# Patient Record
Sex: Male | Born: 1960 | Race: White | Hispanic: No | Smoking: Current every day smoker
Health system: Southern US, Community
[De-identification: ages and names within clinical notes are randomized; demographics above are authoritative.]

## PROBLEM LIST (undated history)

## (undated) DIAGNOSIS — K219 Gastro-esophageal reflux disease without esophagitis: Secondary | ICD-10-CM

## (undated) DIAGNOSIS — E11621 Type 2 diabetes mellitus with foot ulcer: Secondary | ICD-10-CM

## (undated) DIAGNOSIS — G9009 Other idiopathic peripheral autonomic neuropathy: Secondary | ICD-10-CM

## (undated) DIAGNOSIS — M5136 Other intervertebral disc degeneration, lumbar region: Secondary | ICD-10-CM

## (undated) DIAGNOSIS — M21371 Foot drop, right foot: Secondary | ICD-10-CM

## (undated) DIAGNOSIS — M6281 Muscle weakness (generalized): Secondary | ICD-10-CM

## (undated) DIAGNOSIS — J449 Chronic obstructive pulmonary disease, unspecified: Secondary | ICD-10-CM

## (undated) DIAGNOSIS — Z973 Presence of spectacles and contact lenses: Secondary | ICD-10-CM

## (undated) DIAGNOSIS — R262 Difficulty in walking, not elsewhere classified: Secondary | ICD-10-CM

## (undated) DIAGNOSIS — I639 Cerebral infarction, unspecified: Secondary | ICD-10-CM

## (undated) DIAGNOSIS — G8929 Other chronic pain: Secondary | ICD-10-CM

## (undated) DIAGNOSIS — N189 Chronic kidney disease, unspecified: Secondary | ICD-10-CM

## (undated) DIAGNOSIS — C801 Malignant (primary) neoplasm, unspecified: Secondary | ICD-10-CM

## (undated) DIAGNOSIS — R339 Retention of urine, unspecified: Secondary | ICD-10-CM

## (undated) DIAGNOSIS — Z972 Presence of dental prosthetic device (complete) (partial): Secondary | ICD-10-CM

## (undated) DIAGNOSIS — M549 Dorsalgia, unspecified: Secondary | ICD-10-CM

## (undated) DIAGNOSIS — R2681 Unsteadiness on feet: Secondary | ICD-10-CM

## (undated) DIAGNOSIS — I96 Gangrene, not elsewhere classified: Secondary | ICD-10-CM

## (undated) DIAGNOSIS — N390 Urinary tract infection, site not specified: Secondary | ICD-10-CM

## (undated) DIAGNOSIS — E785 Hyperlipidemia, unspecified: Secondary | ICD-10-CM

## (undated) DIAGNOSIS — M86171 Other acute osteomyelitis, right ankle and foot: Principal | ICD-10-CM

## (undated) DIAGNOSIS — C4492 Squamous cell carcinoma of skin, unspecified: Secondary | ICD-10-CM

## (undated) DIAGNOSIS — M51369 Other intervertebral disc degeneration, lumbar region without mention of lumbar back pain or lower extremity pain: Secondary | ICD-10-CM

## (undated) DIAGNOSIS — C4339 Malignant melanoma of other parts of face: Secondary | ICD-10-CM

## (undated) DIAGNOSIS — I10 Essential (primary) hypertension: Secondary | ICD-10-CM

## (undated) DIAGNOSIS — C44111 Basal cell carcinoma of skin of unspecified eyelid, including canthus: Secondary | ICD-10-CM

## (undated) DIAGNOSIS — C439 Malignant melanoma of skin, unspecified: Secondary | ICD-10-CM

## (undated) DIAGNOSIS — M21372 Foot drop, left foot: Secondary | ICD-10-CM

## (undated) DIAGNOSIS — Z89412 Acquired absence of left great toe: Secondary | ICD-10-CM

## (undated) DIAGNOSIS — N319 Neuromuscular dysfunction of bladder, unspecified: Secondary | ICD-10-CM

## (undated) DIAGNOSIS — I509 Heart failure, unspecified: Secondary | ICD-10-CM

## (undated) DIAGNOSIS — M869 Osteomyelitis, unspecified: Secondary | ICD-10-CM

## (undated) DIAGNOSIS — G894 Chronic pain syndrome: Secondary | ICD-10-CM

## (undated) DIAGNOSIS — R7881 Bacteremia: Secondary | ICD-10-CM

## (undated) DIAGNOSIS — F32A Depression, unspecified: Secondary | ICD-10-CM

## (undated) DIAGNOSIS — E119 Type 2 diabetes mellitus without complications: Secondary | ICD-10-CM

## (undated) DIAGNOSIS — G062 Extradural and subdural abscess, unspecified: Secondary | ICD-10-CM

## (undated) DIAGNOSIS — B379 Candidiasis, unspecified: Secondary | ICD-10-CM

## (undated) DIAGNOSIS — J9600 Acute respiratory failure, unspecified whether with hypoxia or hypercapnia: Secondary | ICD-10-CM

## (undated) DIAGNOSIS — D649 Anemia, unspecified: Secondary | ICD-10-CM

## (undated) DIAGNOSIS — G4700 Insomnia, unspecified: Secondary | ICD-10-CM

## (undated) DIAGNOSIS — B001 Herpesviral vesicular dermatitis: Secondary | ICD-10-CM

## (undated) DIAGNOSIS — R0981 Nasal congestion: Secondary | ICD-10-CM

## (undated) DIAGNOSIS — F329 Major depressive disorder, single episode, unspecified: Secondary | ICD-10-CM

## (undated) DIAGNOSIS — M8630 Chronic multifocal osteomyelitis, unspecified site: Secondary | ICD-10-CM

## (undated) DIAGNOSIS — L97529 Non-pressure chronic ulcer of other part of left foot with unspecified severity: Secondary | ICD-10-CM

## (undated) DIAGNOSIS — T8740 Infection of amputation stump, unspecified extremity: Secondary | ICD-10-CM

## (undated) DIAGNOSIS — T884XXA Failed or difficult intubation, initial encounter: Secondary | ICD-10-CM

## (undated) DIAGNOSIS — B9562 Methicillin resistant Staphylococcus aureus infection as the cause of diseases classified elsewhere: Secondary | ICD-10-CM

## (undated) DIAGNOSIS — Z89511 Acquired absence of right leg below knee: Secondary | ICD-10-CM

## (undated) HISTORY — DX: Cerebral infarction, unspecified: I63.9

## (undated) HISTORY — DX: Foot drop, right foot: M21.371

## (undated) HISTORY — DX: Non-pressure chronic ulcer of other part of left foot with unspecified severity: L97.529

## (undated) HISTORY — DX: Extradural and subdural abscess, unspecified: G06.2

## (undated) HISTORY — DX: Candidiasis, unspecified: B37.9

## (undated) HISTORY — PX: MULTIPLE TOOTH EXTRACTIONS: SHX2053

## (undated) HISTORY — DX: Other acute osteomyelitis, right ankle and foot: M86.171

## (undated) HISTORY — DX: Infection of amputation stump, unspecified extremity: T87.40

## (undated) HISTORY — DX: Gangrene, not elsewhere classified: I96

## (undated) HISTORY — DX: Type 2 diabetes mellitus with foot ulcer: E11.621

---

## 2006-10-15 ENCOUNTER — Encounter: Admission: RE | Admit: 2006-10-15 | Discharge: 2006-10-15 | Payer: Self-pay | Admitting: Family Medicine

## 2008-08-02 ENCOUNTER — Emergency Department (HOSPITAL_COMMUNITY): Admission: EM | Admit: 2008-08-02 | Discharge: 2008-08-03 | Payer: Self-pay | Admitting: Emergency Medicine

## 2010-10-03 LAB — URINALYSIS, ROUTINE W REFLEX MICROSCOPIC
Ketones, ur: NEGATIVE mg/dL
Nitrite: NEGATIVE
Specific Gravity, Urine: 1.017 (ref 1.005–1.030)
pH: 6 (ref 5.0–8.0)

## 2010-10-03 LAB — COMPREHENSIVE METABOLIC PANEL
ALT: 41 U/L (ref 0–53)
Albumin: 3.9 g/dL (ref 3.5–5.2)
Alkaline Phosphatase: 75 U/L (ref 39–117)
Chloride: 96 mEq/L (ref 96–112)
Potassium: 3.1 mEq/L — ABNORMAL LOW (ref 3.5–5.1)
Sodium: 134 mEq/L — ABNORMAL LOW (ref 135–145)
Total Bilirubin: 1.1 mg/dL (ref 0.3–1.2)
Total Protein: 7.7 g/dL (ref 6.0–8.3)

## 2010-10-03 LAB — CBC
MCHC: 35.3 g/dL (ref 30.0–36.0)
RBC: 4.96 MIL/uL (ref 4.22–5.81)
RDW: 13.7 % (ref 11.5–15.5)

## 2010-10-03 LAB — DIFFERENTIAL
Eosinophils Absolute: 0.1 10*3/uL (ref 0.0–0.7)
Eosinophils Relative: 1 % (ref 0–5)
Lymphs Abs: 4.7 10*3/uL — ABNORMAL HIGH (ref 0.7–4.0)
Monocytes Absolute: 0.8 10*3/uL (ref 0.1–1.0)
Monocytes Relative: 6 % (ref 3–12)

## 2010-10-03 LAB — POCT CARDIAC MARKERS: Myoglobin, poc: 113 ng/mL (ref 12–200)

## 2012-03-25 ENCOUNTER — Emergency Department (HOSPITAL_COMMUNITY): Payer: Self-pay

## 2012-03-25 ENCOUNTER — Inpatient Hospital Stay (HOSPITAL_COMMUNITY)
Admission: EM | Admit: 2012-03-25 | Discharge: 2012-04-17 | DRG: 004 | Disposition: A | Discharge status: Home / Self Care | Payer: 59 | Attending: Internal Medicine | Admitting: Internal Medicine

## 2012-03-25 ENCOUNTER — Encounter (HOSPITAL_COMMUNITY): Payer: Self-pay | Admitting: *Deleted

## 2012-03-25 DIAGNOSIS — J9600 Acute respiratory failure, unspecified whether with hypoxia or hypercapnia: Secondary | ICD-10-CM

## 2012-03-25 DIAGNOSIS — Z794 Long term (current) use of insulin: Secondary | ICD-10-CM

## 2012-03-25 DIAGNOSIS — F17200 Nicotine dependence, unspecified, uncomplicated: Secondary | ICD-10-CM | POA: Diagnosis present

## 2012-03-25 DIAGNOSIS — N179 Acute kidney failure, unspecified: Secondary | ICD-10-CM

## 2012-03-25 DIAGNOSIS — Z23 Encounter for immunization: Secondary | ICD-10-CM

## 2012-03-25 DIAGNOSIS — R6520 Severe sepsis without septic shock: Secondary | ICD-10-CM | POA: Diagnosis present

## 2012-03-25 DIAGNOSIS — I214 Non-ST elevation (NSTEMI) myocardial infarction: Secondary | ICD-10-CM | POA: Diagnosis present

## 2012-03-25 DIAGNOSIS — E1142 Type 2 diabetes mellitus with diabetic polyneuropathy: Secondary | ICD-10-CM | POA: Diagnosis present

## 2012-03-25 DIAGNOSIS — I4891 Unspecified atrial fibrillation: Secondary | ICD-10-CM | POA: Diagnosis present

## 2012-03-25 DIAGNOSIS — J189 Pneumonia, unspecified organism: Secondary | ICD-10-CM

## 2012-03-25 DIAGNOSIS — M549 Dorsalgia, unspecified: Secondary | ICD-10-CM | POA: Diagnosis present

## 2012-03-25 DIAGNOSIS — G9341 Metabolic encephalopathy: Secondary | ICD-10-CM | POA: Diagnosis present

## 2012-03-25 DIAGNOSIS — R6521 Severe sepsis with septic shock: Secondary | ICD-10-CM

## 2012-03-25 DIAGNOSIS — J4489 Other specified chronic obstructive pulmonary disease: Secondary | ICD-10-CM | POA: Diagnosis present

## 2012-03-25 DIAGNOSIS — E119 Type 2 diabetes mellitus without complications: Secondary | ICD-10-CM | POA: Diagnosis present

## 2012-03-25 DIAGNOSIS — A419 Sepsis, unspecified organism: Secondary | ICD-10-CM

## 2012-03-25 DIAGNOSIS — A409 Streptococcal sepsis, unspecified: Principal | ICD-10-CM | POA: Diagnosis present

## 2012-03-25 DIAGNOSIS — G9340 Encephalopathy, unspecified: Secondary | ICD-10-CM

## 2012-03-25 DIAGNOSIS — E871 Hypo-osmolality and hyponatremia: Secondary | ICD-10-CM | POA: Diagnosis not present

## 2012-03-25 DIAGNOSIS — E1149 Type 2 diabetes mellitus with other diabetic neurological complication: Secondary | ICD-10-CM | POA: Diagnosis present

## 2012-03-25 DIAGNOSIS — D696 Thrombocytopenia, unspecified: Secondary | ICD-10-CM | POA: Diagnosis not present

## 2012-03-25 DIAGNOSIS — I10 Essential (primary) hypertension: Secondary | ICD-10-CM | POA: Diagnosis present

## 2012-03-25 DIAGNOSIS — G8929 Other chronic pain: Secondary | ICD-10-CM | POA: Diagnosis present

## 2012-03-25 DIAGNOSIS — R7309 Other abnormal glucose: Secondary | ICD-10-CM

## 2012-03-25 DIAGNOSIS — D649 Anemia, unspecified: Secondary | ICD-10-CM | POA: Diagnosis not present

## 2012-03-25 DIAGNOSIS — Z79899 Other long term (current) drug therapy: Secondary | ICD-10-CM

## 2012-03-25 DIAGNOSIS — R739 Hyperglycemia, unspecified: Secondary | ICD-10-CM | POA: Diagnosis present

## 2012-03-25 DIAGNOSIS — J449 Chronic obstructive pulmonary disease, unspecified: Secondary | ICD-10-CM | POA: Diagnosis present

## 2012-03-25 DIAGNOSIS — H109 Unspecified conjunctivitis: Secondary | ICD-10-CM | POA: Diagnosis not present

## 2012-03-25 DIAGNOSIS — E875 Hyperkalemia: Secondary | ICD-10-CM | POA: Diagnosis not present

## 2012-03-25 DIAGNOSIS — K7200 Acute and subacute hepatic failure without coma: Secondary | ICD-10-CM | POA: Diagnosis not present

## 2012-03-25 DIAGNOSIS — J13 Pneumonia due to Streptococcus pneumoniae: Secondary | ICD-10-CM

## 2012-03-25 HISTORY — DX: Type 2 diabetes mellitus without complications: E11.9

## 2012-03-25 HISTORY — DX: Essential (primary) hypertension: I10

## 2012-03-25 HISTORY — DX: Other intervertebral disc degeneration, lumbar region without mention of lumbar back pain or lower extremity pain: M51.369

## 2012-03-25 HISTORY — DX: Other intervertebral disc degeneration, lumbar region: M51.36

## 2012-03-25 LAB — COMPREHENSIVE METABOLIC PANEL
ALT: 33 U/L (ref 0–53)
Albumin: 2.8 g/dL — ABNORMAL LOW (ref 3.5–5.2)
Alkaline Phosphatase: 68 U/L (ref 39–117)
Glucose, Bld: 132 mg/dL — ABNORMAL HIGH (ref 70–99)
Potassium: 4.2 mEq/L (ref 3.5–5.1)
Sodium: 133 mEq/L — ABNORMAL LOW (ref 135–145)
Total Protein: 6.8 g/dL (ref 6.0–8.3)

## 2012-03-25 LAB — CBC WITH DIFFERENTIAL/PLATELET
Basophils Absolute: 0 10*3/uL (ref 0.0–0.1)
Basophils Relative: 0 % (ref 0–1)
Eosinophils Absolute: 0 10*3/uL (ref 0.0–0.7)
Hemoglobin: 12.9 g/dL — ABNORMAL LOW (ref 13.0–17.0)
Lymphocytes Relative: 8 % — ABNORMAL LOW (ref 12–46)
MCHC: 34.1 g/dL (ref 30.0–36.0)
Neutrophils Relative %: 91 % — ABNORMAL HIGH (ref 43–77)
WBC: 8.3 10*3/uL (ref 4.0–10.5)

## 2012-03-25 LAB — URINALYSIS, ROUTINE W REFLEX MICROSCOPIC
Nitrite: POSITIVE — AB
Specific Gravity, Urine: 1.027 (ref 1.005–1.030)
pH: 5 (ref 5.0–8.0)

## 2012-03-25 LAB — POCT I-STAT 3, ART BLOOD GAS (G3+)
Acid-base deficit: 4 mmol/L — ABNORMAL HIGH (ref 0.0–2.0)
Bicarbonate: 22.7 mEq/L (ref 20.0–24.0)
pCO2 arterial: 47 mmHg — ABNORMAL HIGH (ref 35.0–45.0)
pO2, Arterial: 70 mmHg — ABNORMAL LOW (ref 80.0–100.0)

## 2012-03-25 LAB — PROCALCITONIN: Procalcitonin: 80.11 ng/mL

## 2012-03-25 LAB — CARBOXYHEMOGLOBIN
Carboxyhemoglobin: 2.1 % — ABNORMAL HIGH (ref 0.5–1.5)
O2 Saturation: 68.5 %

## 2012-03-25 LAB — URINE MICROSCOPIC-ADD ON

## 2012-03-25 LAB — ACETAMINOPHEN LEVEL: Acetaminophen (Tylenol), Serum: <15 ug/mL (ref 10–30)

## 2012-03-25 MED ORDER — LEVOFLOXACIN IN D5W 750 MG/150ML IV SOLN: 750.0000 mg | INTRAVENOUS | Status: DC

## 2012-03-25 MED ORDER — HEPARIN SODIUM (PORCINE) 5000 UNIT/ML IJ SOLN
5000.0000 [IU] | Freq: Three times a day (TID) | INTRAMUSCULAR | Status: DC
  Administered 2012-03-25 – 2012-03-30 (×13): 5000 [IU] via SUBCUTANEOUS
  Filled 2012-03-25 (×18): qty 1

## 2012-03-25 MED ORDER — SODIUM CHLORIDE 0.9 % IV BOLUS (SEPSIS)
1000.0000 mL | Freq: Once | INTRAVENOUS | Status: AC
  Administered 2012-03-25: 1000 mL via INTRAVENOUS

## 2012-03-25 MED ORDER — SUCCINYLCHOLINE CHLORIDE 20 MG/ML IJ SOLN
INTRAMUSCULAR | Status: AC
  Filled 2012-03-25: qty 1

## 2012-03-25 MED ORDER — DEXTROSE 5 % IV SOLN
500.0000 mg | Freq: Once | INTRAVENOUS | Status: AC
  Administered 2012-03-25: 500 mg via INTRAVENOUS
  Filled 2012-03-25: qty 500

## 2012-03-25 MED ORDER — INSULIN ASPART 100 UNIT/ML ~~LOC~~ SOLN
2.0000 [IU] | SUBCUTANEOUS | Status: DC
  Administered 2012-03-25: 6 [IU] via SUBCUTANEOUS
  Administered 2012-03-26 (×5): 2 [IU] via SUBCUTANEOUS
  Administered 2012-03-27: 4 [IU] via SUBCUTANEOUS
  Administered 2012-03-27: 6 [IU] via SUBCUTANEOUS
  Administered 2012-03-27 – 2012-03-28 (×5): 4 [IU] via SUBCUTANEOUS
  Administered 2012-03-28 – 2012-03-31 (×9): 2 [IU] via SUBCUTANEOUS
  Administered 2012-03-31: 4 [IU] via SUBCUTANEOUS
  Administered 2012-03-31 – 2012-04-01 (×5): 2 [IU] via SUBCUTANEOUS
  Administered 2012-04-01 (×2): 4 [IU] via SUBCUTANEOUS
  Administered 2012-04-01: 2 [IU] via SUBCUTANEOUS
  Administered 2012-04-02: 4 [IU] via SUBCUTANEOUS
  Administered 2012-04-02 (×3): 2 [IU] via SUBCUTANEOUS
  Administered 2012-04-02 – 2012-04-03 (×2): 4 [IU] via SUBCUTANEOUS
  Administered 2012-04-03: 2 [IU] via SUBCUTANEOUS
  Administered 2012-04-03 (×2): 4 [IU] via SUBCUTANEOUS
  Administered 2012-04-03: 2 [IU] via SUBCUTANEOUS
  Administered 2012-04-03: 4 [IU] via SUBCUTANEOUS
  Administered 2012-04-04: 2 [IU] via SUBCUTANEOUS
  Administered 2012-04-04 (×3): 4 [IU] via SUBCUTANEOUS
  Administered 2012-04-04 – 2012-04-05 (×3): 2 [IU] via SUBCUTANEOUS
  Administered 2012-04-05: 4 [IU] via SUBCUTANEOUS
  Administered 2012-04-05: 6 [IU] via SUBCUTANEOUS
  Administered 2012-04-05: 2 [IU] via SUBCUTANEOUS
  Administered 2012-04-05 (×2): 4 [IU] via SUBCUTANEOUS
  Administered 2012-04-06: 2 [IU] via SUBCUTANEOUS
  Administered 2012-04-06: 4 [IU] via SUBCUTANEOUS
  Administered 2012-04-06 (×2): 2 [IU] via SUBCUTANEOUS
  Administered 2012-04-06: 4 [IU] via SUBCUTANEOUS
  Administered 2012-04-06: 2 [IU] via SUBCUTANEOUS
  Administered 2012-04-07 (×4): 4 [IU] via SUBCUTANEOUS
  Administered 2012-04-07: 6 [IU] via SUBCUTANEOUS
  Administered 2012-04-07 – 2012-04-08 (×3): 4 [IU] via SUBCUTANEOUS
  Administered 2012-04-08: 6 [IU] via SUBCUTANEOUS
  Administered 2012-04-08 – 2012-04-09 (×5): 4 [IU] via SUBCUTANEOUS
  Administered 2012-04-09: 6 [IU] via SUBCUTANEOUS
  Administered 2012-04-09: 4 [IU] via SUBCUTANEOUS

## 2012-03-25 MED ORDER — BIOTENE DRY MOUTH MT LIQD
1.0000 "application " | Freq: Four times a day (QID) | OROMUCOSAL | Status: DC
  Administered 2012-03-26 – 2012-04-14 (×77): 15 mL via OROMUCOSAL

## 2012-03-25 MED ORDER — DEXTROSE 5 % IV SOLN
INTRAVENOUS | Status: AC
  Filled 2012-03-25: qty 250

## 2012-03-25 MED ORDER — FENTANYL CITRATE 0.05 MG/ML IJ SOLN
INTRAMUSCULAR | Status: AC
  Filled 2012-03-25: qty 4

## 2012-03-25 MED ORDER — ROCURONIUM BROMIDE 50 MG/5ML IV SOLN
INTRAVENOUS | Status: AC | PRN
  Administered 2012-03-25: 50 mg via INTRAVENOUS

## 2012-03-25 MED ORDER — ETOMIDATE 2 MG/ML IV SOLN
INTRAVENOUS | Status: AC | PRN
  Administered 2012-03-25 (×2): 20 mg via INTRAVENOUS

## 2012-03-25 MED ORDER — PIPERACILLIN-TAZOBACTAM IN DEX 2-0.25 GM/50ML IV SOLN
2.2500 g | Freq: Once | INTRAVENOUS | Status: AC
  Administered 2012-03-25: 2.25 g via INTRAVENOUS
  Filled 2012-03-25: qty 50

## 2012-03-25 MED ORDER — PANTOPRAZOLE SODIUM 40 MG IV SOLR
40.0000 mg | INTRAVENOUS | Status: DC
  Administered 2012-03-25 – 2012-03-28 (×4): 40 mg via INTRAVENOUS
  Filled 2012-03-25 (×7): qty 40

## 2012-03-25 MED ORDER — NOREPINEPHRINE BITARTRATE 1 MG/ML IJ SOLN
2.0000 ug/min | INTRAVENOUS | Status: DC
  Administered 2012-03-25: 40 ug/min via INTRAVENOUS
  Filled 2012-03-25 (×2): qty 4

## 2012-03-25 MED ORDER — ETOMIDATE 2 MG/ML IV SOLN
INTRAVENOUS | Status: AC
  Filled 2012-03-25: qty 20

## 2012-03-25 MED ORDER — NOREPINEPHRINE BITARTRATE 1 MG/ML IJ SOLN
2.0000 ug/min | INTRAMUSCULAR | Status: DC
  Administered 2012-03-26: 30 ug/min via INTRAVENOUS
  Administered 2012-03-26: 20 ug/min via INTRAVENOUS
  Filled 2012-03-25 (×2): qty 16

## 2012-03-25 MED ORDER — NOREPINEPHRINE BITARTRATE 1 MG/ML IJ SOLN
INTRAMUSCULAR | Status: AC
  Filled 2012-03-25: qty 4

## 2012-03-25 MED ORDER — NOREPINEPHRINE BITARTRATE 1 MG/ML IJ SOLN
INTRAMUSCULAR | Status: AC | PRN
  Administered 2012-03-25: 30 ug via INTRAVENOUS

## 2012-03-25 MED ORDER — DEXTROSE 5 % IV SOLN
1.0000 g | Freq: Once | INTRAVENOUS | Status: AC
  Administered 2012-03-25: 1 g via INTRAVENOUS
  Filled 2012-03-25: qty 10

## 2012-03-25 MED ORDER — IPRATROPIUM-ALBUTEROL 18-103 MCG/ACT IN AERO
6.0000 | INHALATION_SPRAY | RESPIRATORY_TRACT | Status: DC
  Administered 2012-03-26 – 2012-03-31 (×31): 6 via RESPIRATORY_TRACT
  Filled 2012-03-25 (×2): qty 14.7

## 2012-03-25 MED ORDER — PIPERACILLIN-TAZOBACTAM IN DEX 2-0.25 GM/50ML IV SOLN
2.2500 g | Freq: Four times a day (QID) | INTRAVENOUS | Status: DC
  Administered 2012-03-26 (×2): 2.25 g via INTRAVENOUS
  Filled 2012-03-25 (×4): qty 50

## 2012-03-25 MED ORDER — FENTANYL CITRATE 0.05 MG/ML IJ SOLN
INTRAMUSCULAR | Status: AC | PRN
  Administered 2012-03-25: 100 ug via INTRAVENOUS

## 2012-03-25 MED ORDER — SODIUM CHLORIDE 0.9 % IV SOLN
25.0000 ug/h | INTRAVENOUS | Status: DC
  Administered 2012-03-25: 25 ug/h via INTRAVENOUS
  Administered 2012-03-25: 75 ug/h via INTRAVENOUS
  Administered 2012-03-26: 150 ug/h via INTRAVENOUS
  Administered 2012-03-27: 100 ug/h via INTRAVENOUS
  Administered 2012-03-28: 150 ug/h via INTRAVENOUS
  Administered 2012-03-29: 200 ug/h via INTRAVENOUS
  Administered 2012-03-29: 250 ug/h via INTRAVENOUS
  Administered 2012-03-30: 350 ug/h via INTRAVENOUS
  Administered 2012-03-30: 400 ug/h via INTRAVENOUS
  Filled 2012-03-25 (×11): qty 50

## 2012-03-25 MED ORDER — SODIUM CHLORIDE 0.9 % IV SOLN
2000.0000 mg | Freq: Once | INTRAVENOUS | Status: AC
  Administered 2012-03-25: 2000 mg via INTRAVENOUS
  Filled 2012-03-25: qty 2000

## 2012-03-25 MED ORDER — LEVOFLOXACIN IN D5W 750 MG/150ML IV SOLN
750.0000 mg | INTRAVENOUS | Status: DC
  Filled 2012-03-25: qty 150

## 2012-03-25 MED ORDER — LIDOCAINE HCL (CARDIAC) 20 MG/ML IV SOLN
INTRAVENOUS | Status: AC
  Filled 2012-03-25: qty 5

## 2012-03-25 MED ORDER — ROCURONIUM BROMIDE 50 MG/5ML IV SOLN
INTRAVENOUS | Status: AC
  Filled 2012-03-25: qty 2

## 2012-03-25 MED ORDER — CHLORHEXIDINE GLUCONATE 0.12 % MT SOLN
15.0000 mL | Freq: Two times a day (BID) | OROMUCOSAL | Status: DC
  Administered 2012-03-25: 15 mL via OROMUCOSAL
  Filled 2012-03-25 (×2): qty 15

## 2012-03-25 MED ORDER — SODIUM CHLORIDE 0.9 % IV SOLN
2.0000 mg/h | INTRAVENOUS | Status: DC
  Administered 2012-03-25: 5 mg/h via INTRAVENOUS
  Administered 2012-03-25: 7 mg/h via INTRAVENOUS
  Administered 2012-03-26: 8 mg/h via INTRAVENOUS
  Administered 2012-03-26: 7 mg/h via INTRAVENOUS
  Administered 2012-03-26: 4 mg/h via INTRAVENOUS
  Administered 2012-03-27: 2 mg/h via INTRAVENOUS
  Administered 2012-03-28: 4 mg/h via INTRAVENOUS
  Administered 2012-03-28: 6 mg/h via INTRAVENOUS
  Administered 2012-03-28: 4 mg/h via INTRAVENOUS
  Administered 2012-03-29 (×2): 6 mg/h via INTRAVENOUS
  Administered 2012-03-30: 10 mg/h via INTRAVENOUS
  Administered 2012-03-30: 8 mg/h via INTRAVENOUS
  Administered 2012-03-30: 6 mg/h via INTRAVENOUS
  Administered 2012-03-31: 8 mg/h via INTRAVENOUS
  Administered 2012-04-01: 6 mg/h via INTRAVENOUS
  Administered 2012-04-02 (×3): 10 mg/h via INTRAVENOUS
  Administered 2012-04-03: 8 mg/h via INTRAVENOUS
  Administered 2012-04-03 (×3): 10 mg/h via INTRAVENOUS
  Administered 2012-04-04 (×2): 9 mg/h via INTRAVENOUS
  Filled 2012-03-25 (×31): qty 10

## 2012-03-25 MED ORDER — IPRATROPIUM-ALBUTEROL 18-103 MCG/ACT IN AERO
6.0000 | INHALATION_SPRAY | RESPIRATORY_TRACT | Status: DC | PRN
  Filled 2012-03-25 (×2): qty 14.7

## 2012-03-25 MED ORDER — SODIUM CHLORIDE 0.9 % IV BOLUS (SEPSIS): 1000.0000 mL | Freq: Once | INTRAVENOUS | Status: DC

## 2012-03-25 MED ORDER — SODIUM CHLORIDE 0.9 % IV SOLN
INTRAVENOUS | Status: DC
  Administered 2012-03-25: 100 mL/h via INTRAVENOUS
  Administered 2012-03-26: 14:00:00 via INTRAVENOUS

## 2012-03-25 NOTE — ED Notes (Signed)
ARRIETA, JULIO informed of pt condition and B/p. Respiratory called to bedside.

## 2012-03-25 NOTE — ED Provider Notes (Signed)
History     CSN: [id number]  Arrival date & time 03/25/12  1623   First MD Initiated Contact with Patient 03/25/12 1644      Chief Complaint  Patient presents with  . Respiratory Distress    HPI  51 year old male after it was her diabetes and hypertension presents with couple days history of productive cough, shortness of breath, and feeling weak. His wife was recently diagnosed with pneumonia. EMS was called and on seen patient was noted to be in respiratory distress with sats down to 88%. Denies any chest pain, nausea, bowel pain, vomiting, diarrhea, headache.  The patient received albuterol treatment en-route to the hospital . On arrival is tachycardic pulse 19, tachypneic respiratory rates in the 30s, and hypotensive.  Past Medical History  Diagnosis Date  . Diabetes mellitus without complication   . Hypertension     History reviewed. No pertinent past surgical history.  No family history on file.  History  Substance Use Topics  . Smoking status: Current Every Day Smoker  . Smokeless tobacco: Not on file  . Alcohol Use: No      Review of Systems  Unable to perform ROS Constitutional: Positive for fever, chills and activity change.  Respiratory: Positive for cough and wheezing.     Allergies  Review of patient's allergies indicates no known allergies.  Home Medications  No current outpatient prescriptions on file.  BP 90/60  Pulse 109  Resp 32  SpO2 95%  Physical Exam  Constitutional: He is oriented to person, place, and time. He appears well-developed.       Obese  HENT:  Head: Normocephalic and atraumatic.  Mouth/Throat: Oropharynx is clear and moist.  Eyes: Pupils are equal, round, and reactive to light. Right eye exhibits no discharge. Left eye exhibits no discharge. No scleral icterus.  Neck: Normal range of motion. Neck supple. No tracheal deviation present.  Cardiovascular: Normal heart sounds and intact distal pulses.        Tachycardic    Pulmonary/Chest: He is in respiratory distress. He has rales ( Diffuse bibasilar).  Abdominal: Soft. He exhibits no distension. There is no tenderness. There is no rebound and no guarding.  Musculoskeletal: Normal range of motion. He exhibits no tenderness.  Neurological: He is alert and oriented to person, place, and time.  Skin: Skin is warm.    ED Course  Procedures (including critical care time)    Results for orders placed during the hospital encounter of 03/25/12  CBC WITH DIFFERENTIAL      Component Value Range   WBC 8.3  4.0 - 10.5 K/uL   RBC 4.22  4.22 - 5.81 MIL/uL   Hemoglobin 12.9 (*) 13.0 - 17.0 g/dL   HCT 78.2 (*) 95.6 - 21.3 %   MCV 89.6  78.0 - 100.0 fL   MCH 30.6  26.0 - 34.0 pg   MCHC 34.1  30.0 - 36.0 g/dL   RDW 08.6  57.8 - 46.9 %   Platelets 196  150 - 400 K/uL   Neutrophils Relative 91 (*) 43 - 77 %   Lymphocytes Relative 8 (*) 12 - 46 %   Monocytes Relative 1 (*) 3 - 12 %   Eosinophils Relative 0  0 - 5 %   Basophils Relative 0  0 - 1 %   Neutro Abs 7.5  1.7 - 7.7 K/uL   Lymphs Abs 0.7  0.7 - 4.0 K/uL   Monocytes Absolute 0.1  0.1 - 1.0 K/uL  Eosinophils Absolute 0.0  0.0 - 0.7 K/uL   Basophils Absolute 0.0  0.0 - 0.1 K/uL   WBC Morphology INCREASED BANDS (>20% BANDS)    COMPREHENSIVE METABOLIC PANEL      Component Value Range   Sodium 133 (*) 135 - 145 mEq/L   Potassium 4.2  3.5 - 5.1 mEq/L   Chloride 88 (*) 96 - 112 mEq/L   CO2 20  19 - 32 mEq/L   Glucose, Bld 132 (*) 70 - 99 mg/dL   BUN 65 (*) 6 - 23 mg/dL   Creatinine, Ser 0.98 (*) 0.50 - 1.35 mg/dL   Calcium 8.1 (*) 8.4 - 10.5 mg/dL   Total Protein 6.8  6.0 - 8.3 g/dL   Albumin 2.8 (*) 3.5 - 5.2 g/dL   AST 40 (*) 0 - 37 U/L   ALT 33  0 - 53 U/L   Alkaline Phosphatase 68  39 - 117 U/L   Total Bilirubin 0.5  0.3 - 1.2 mg/dL   GFR calc non Af Amer 10 (*) >90 mL/min   GFR calc Af Amer 12 (*) >90 mL/min  LACTIC ACID, PLASMA      Component Value Range   Lactic Acid, Venous 5.7 (*) 0.5 -  2.2 mmol/L  PRO B NATRIURETIC PEPTIDE      Component Value Range   Pro B Natriuretic peptide (BNP) 7610.0 (*) 0 - 125 pg/mL  GLUCOSE, CAPILLARY      Component Value Range   Glucose-Capillary 124 (*) 70 - 99 mg/dL  ACETAMINOPHEN LEVEL      Component Value Range   Acetaminophen (Tylenol), Serum <15.0  10 - 30 ug/mL  URINALYSIS, ROUTINE W REFLEX MICROSCOPIC      Component Value Range   Color, Urine ORANGE (*) YELLOW   APPearance TURBID (*) CLEAR   Specific Gravity, Urine 1.027  1.005 - 1.030   pH 5.0  5.0 - 8.0   Glucose, UA NEGATIVE  NEGATIVE mg/dL   Hgb urine dipstick LARGE (*) NEGATIVE   Bilirubin Urine MODERATE (*) NEGATIVE   Ketones, ur 15 (*) NEGATIVE mg/dL   Protein, ur 30 (*) NEGATIVE mg/dL   Urobilinogen, UA 0.2  0.0 - 1.0 mg/dL   Nitrite POSITIVE (*) NEGATIVE   Leukocytes, UA SMALL (*) NEGATIVE  PROCALCITONIN      Component Value Range   Procalcitonin 80.11    POCT I-STAT 3, BLOOD GAS (G3+)      Component Value Range   pH, Arterial 7.297 (*) 7.350 - 7.450   pCO2 arterial 47.0 (*) 35.0 - 45.0 mmHg   pO2, Arterial 70.0 (*) 80.0 - 100.0 mmHg   Bicarbonate 22.7  20.0 - 24.0 mEq/L   TCO2 24  0 - 100 mmol/L   O2 Saturation 91.0     Acid-base deficit 4.0 (*) 0.0 - 2.0 mmol/L   Patient temperature 37.9 C     Collection site RADIAL, ALLEN'S TEST ACCEPTABLE     Drawn by Operator     Sample type ARTERIAL    URINE MICROSCOPIC-ADD ON      Component Value Range   Squamous Epithelial / LPF RARE  RARE   WBC, UA 0-2  <3 WBC/hpf   RBC / HPF TOO NUMEROUS TO COUNT  <3 RBC/hpf   Bacteria, UA RARE  RARE   Urine-Other AMORPHOUS URATES/PHOSPHATES    LACTIC ACID, PLASMA      Component Value Range   Lactic Acid, Venous 4.6 (*) 0.5 - 2.2 mmol/L  CARBOXYHEMOGLOBIN  Component Value Range   Total hemoglobin 11.3 (*) 13.5 - 18.0 g/dL   O2 Saturation 13.0     Carboxyhemoglobin 2.1 (*) 0.5 - 1.5 %   Methemoglobin 1.5  0.0 - 1.5 %  TROPONIN I      Component Value Range   Troponin I  0.61 (*) <0.30 ng/mL  MRSA PCR SCREENING      Component Value Range   MRSA by PCR NEGATIVE  NEGATIVE  GLUCOSE, CAPILLARY      Component Value Range   Glucose-Capillary 209 (*) 70 - 99 mg/dL      DG Chest Portable 1 View (Final result)   Result time:03/25/12 1940    Final result by Rad Results In Interface (03/25/12 19:40:06)    Narrative:   *RADIOLOGY REPORT*  Clinical Data: Central line placement.  PORTABLE CHEST - 1 VIEW  Comparison: 03/25/2012 at to 5:18 p.m.  Findings: Left IJ line tip projects over the upper SVC. No pneumothorax. ET tube tip 3.9 cm above the carina.  Cardiomegaly is present with bilateral airspace opacities which appear worsened.  Nasogastric tube enters the stomach. Low lung volumes noted.  IMPRESSION: 1. Left IJ line tip: Upper SVC.  2. Worsened bilateral airspace opacities.  3. Cardiomegaly.   Original Report Authenticated By: Dellia Cloud, M.D.             DG Chest Portable 1 View (Final result)   Result time:03/25/12 [phone]    Final result by Rad Results In Interface (03/25/12 17:31:16)    Narrative:   *RADIOLOGY REPORT*  Clinical Data: Respiratory distress.  PORTABLE CHEST - 1 VIEW  Comparison: No priors.  Findings: Single portable view of the chest demonstrates extensive interstitial and patchy multifocal airspace disease throughout the lungs bilaterally, most confluent in the left mid and lower lung, concerning for multilobar pneumonia. There is also likely a small left pleural effusion. Assessment of pulmonary vasculature is limited by the background of multifocal interstitial and airspace disease. A component of underlying pulmonary edema is possible. Cardiac silhouette is partially obscured, but heart size appears borderline enlarged.  IMPRESSION: 1. Extensive patchy multifocal asymmetrically distributed interstitial and airspace disease, as above, concerning for multilobar pneumonia. 2. Superimposed  pulmonary edema is possible, and if present, would be only mild. 3. Possible small left pleural effusion.   Original Report Authenticated By: [name], M.D.       1. Pneumonia   2. Acute encephalopathy   3. Acute renal failure   4. Acute respiratory failure   5. Diabetes   6. Hyperglycemia   7. Sepsis       MDM      51 year old male presents with community acquired pneumonia. Patient is tachycardic on arrival tachypneic hypoxic and hypotensive.  Patient was made with sepsis, blood culture drawn prior to about administration. Chest x-ray shows extensive patchy multifocal interstitial air space disease concerning for multilobar pneumonia. Given continued hypoxia and worsening mental status discussion was had to intubate the patient. Patient was successfully intubated with glidescope. Patient was copiously hydrated with normal saline boluses.  Case was discussed with Dr. Marin Shutter, patient admitted to the intensive care unit. Transfer patient hemodynamically stable, intubated and on mechanical ventilation ventilation.      Nadara Mustard, MD 03/26/12 0005

## 2012-03-25 NOTE — ED Notes (Signed)
JACUBOWITZ, SAM informed of b/p 2 liters of ns running on pressure bag. Pt c/o back pain and thirst. Pt remains alert with periods of confusion. Skin cool and dry at this point

## 2012-03-25 NOTE — ED Notes (Signed)
Aundra Millet Daughter [phone] House phone [phone] [name] Daughter [phone] Tammy girlfriend

## 2012-03-25 NOTE — Procedures (Signed)
Name:  Gregory [name] MRN:  [id number] DOB:  04/09/1961  PROCEDURE NOTE  Procedure:  Ultrasound-guided central venous catheter placement.  Indications:  Need for intravenous access and hemodynamic monitoring.  Consent:  Consent was implied due to the emergency nature of the procedure.  Anesthesia:  A total of 10 mL of 1% Lidocaine was used for local infiltration anesthesia.  Procedure summary:  Appropriate equipment was assembled.  The patient was identified as Gregory [name] and safety timeout was performed. The patient was placed in Trendelenburg position.  Sterile technique was used. The patient's left neck region was prepped using chlorhexidine / alcohol scrub and the field was draped in usual sterile fashion with full body drape. The left internal jugular vein and the left carotid artery were identified by ultrasound, the patency was evaluated and images were documented. After the adequate anesthesia was achieved, the vein was cannulated with the introducer needle under sonographic guidance without difficulty. A guide wire was advanced through the introducer needle, which was then withdrawn. A small skin incision was made at the point of wire entry, the dilator was inserted over the guide wire and appropriate dilation was obtained. The dilator was removed and triple-lumen catheter was advanced over the guide wire, which was then removed.  All ports were aspirated and flushed with normal saline without difficulty. The catheter was secured into place with sutures. Antibiotic patch was placed and sterile dressing was applied. Post-procedure chest x-ray was ordered.  Complications:  No immediate complications were noted.  Hemodynamic parameters and oxygenation remained stable throughout the procedure.  Estimated blood loss:  Less then 5 mL.  Orlean Bradford, M.D. Pulmonary and Critical Care Medicine [facility] Cell: [phone] Pager: [phone]  03/25/2012, 7:57 PM

## 2012-03-25 NOTE — ED Provider Notes (Signed)
Complains of cough productive of yellow sputum and shortness of breath onset 03/20/2012. No fever no other complaint treated for pneumonia as outpatient last week. EMS treated patient with albuterol nebulized treatment. On exam patient is ill-appearing speaks in sentences lungs diffuse rhonchi heart regular in rhythm abdomen obese nontender extremities without edema. Patient remained hypotensive despite treatment with 2 L of normal saline intravenously. He's felt to be septic for multilobar pneumonia. At 6:30 PM he speaks in short sentences lungs diffuse coarse rhonchi audible without a stethoscope. Pulmonary critical care team  called to evaluate patient for admission. CRITICAL CARE Performed by: Doug Sou   Total critical care time: 30 minute  Critical care time was exclusive of separately billable procedures and treating other patients.  Critical care was necessary to treat or prevent imminent or life-threatening deterioration.  Critical care was time spent personally by me on the following activities: development of treatment plan with patient and/or surrogate as well as nursing, discussions with consultants, evaluation of patient's response to treatment, examination of patient, obtaining history from patient or surrogate, ordering and performing treatments and interventions, ordering and review of laboratory studies, ordering and review of radiographic studies, pulse oximetry and re-evaluation of patient's condition.  Doug Sou, MD 03/25/12 1840

## 2012-03-25 NOTE — Progress Notes (Signed)
Pt continues to sat around 95-90% on 100%NRB, with bilateral crackles. Pt appears to be more SOB. RT asked MD about possibly placing pt on BIPAP to help with WOB. MD asked about O2 saturation, and wants a repeat ABG in 30 min. Pt remaining on NRB at this time. RT will continue to monitor.

## 2012-03-25 NOTE — ED Notes (Signed)
Pt family at bedside. Continue to closely monitor b/p. Remains alertx3.  Denies sob does appear to have increase rate. Respiratory aware. Remains on NRB.

## 2012-03-25 NOTE — Progress Notes (Signed)
Pt in respiratory distress, assessed by RT. Pt on NRB at this time with O2 sats around 94%. Crackles heard bilaterally. RT attempting ABG at this time. RT Will continue to monitor.

## 2012-03-25 NOTE — Procedures (Signed)
Arterial Catheter Insertion Procedure Note Gregory [name] [id number] 07/06/1960  Procedure: Insertion of Arterial Catheter  Indications: Blood pressure monitoring and Frequent blood sampling  Procedure Details Consent: Risks of procedure as well as the alternatives and risks of each were explained to the (patient/caregiver).  Consent for procedure obtained. and Unable to obtain consent because of emergent medical necessity. Time Out: Verified patient identification, verified procedure, site/side was marked, verified correct patient position, special equipment/implants available, medications/allergies/relevent history reviewed, required imaging and test results available.  Performed  Maximum sterile technique was used including antiseptics, cap, gloves, gown, hand hygiene, mask and sheet. Skin prep: Chlorhexidine; local anesthetic administered 22 gauge catheter was inserted into right radial artery using the Seldinger technique.  Evaluation Blood flow good; BP tracing good. Complications: No apparent complications.   Clent Ridges 03/25/2012

## 2012-03-25 NOTE — ED Notes (Signed)
EMS was called out for Welfare check and patient was not able to come to door.  Pt with respiratory distress and sats 88% NRB, hypotensive in 60s and tachycardic

## 2012-03-25 NOTE — H&P (Signed)
Name: Gregory [name] MRN: [id number] DOB: Aug 18, 1960    LOS: 0  Referring Provider:  EDP Reason for Referral:  Acute respiratory failure  PULMONARY / CRITICAL CARE MEDICINE  The patient is encephalopathic and unable to provide history, which was obtained for available medical records.  HPI:  51 yo brought to [facility] ED on 10/8 in respiratory distress and intubated for acute respiratory failure.  Past Medical History  Diagnosis Date  . Diabetes mellitus without complication   . Hypertension    History reviewed. No pertinent past surgical history. Prior to Admission medications   Medication Sig Start Date End Date Taking? Authorizing Provider  insulin NPH-insulin regular (NOVOLIN 70/30) (70-30) 100 UNIT/ML injection Inject 35 Units into the skin daily.   Yes Historical Provider, MD  lisinopril (PRINIVIL,ZESTRIL) 5 MG tablet Take 5 mg by mouth daily.   Yes Historical Provider, MD  metFORMIN (GLUCOPHAGE) 1000 MG tablet Take 1,000 mg by mouth 2 (two) times daily with a meal.   Yes Historical Provider, MD  oxycodone (ROXICODONE) 30 MG immediate release tablet Take 30 mg by mouth every 4 (four) hours.   Yes Historical Provider, MD  PARoxetine (PAXIL) 40 MG tablet Take 40 mg by mouth every morning.   Yes Historical Provider, MD  phentermine (ADIPEX-P) 37.5 MG tablet Take 37.5 mg by mouth daily before breakfast.   Yes Historical Provider, MD  pregabalin (LYRICA) 300 MG capsule Take 300 mg by mouth 2 (two) times daily.   Yes Historical Provider, MD  traZODone (DESYREL) 150 MG tablet Take 150 mg by mouth at bedtime.   Yes Historical Provider, MD   Allergies No Known Allergies  Family History No family history on file.  Social History  reports that he has been smoking.  He does not have any smokeless tobacco history on file. He reports that he does not drink alcohol or use illicit drugs.  Review Of Systems:  Unable to provide  Brief patient description:  51 yo brought to [facility] ED on 10/8 in  respiratory distress and intubated for acute respiratory failure.  Events Since Admission: 10/8  Brought to [facility] ED on 10/8 in respiratory distress and intubated for acute respiratory failure.  Current Status:  Vital Signs: Temp:  [99 F (37.2 C)-100.2 F (37.9 C)] 99 F (37.2 C) (10/08 1855) Pulse Rate:  [88-112] 96  (10/08 1855) Resp:  [21-37] 23  (10/08 1855) BP: (61-113)/(31-77) 113/64 mmHg (10/08 1855) SpO2:  [55 %-100 %] 55 % (10/08 1855) FiO2 (%):  [40 %-100 %] 100 % (10/08 1904)  Physical Examination: General:  Mechanically ventilated, synchronous Neuro:  Sedated, nonfocal, cough / gag diminished HEENT:  PERRL, purulent secretions via ETT Neck:  No JVD Cardiovascular:  RRR, tachycardic, no m/r/g Lungs:  Bilateral air entry, scattered rhonchi  Abdomen:  Soft, nontender, nondistended, bowel sounds present Musculoskeletal:  Moves allextremities Skin:  No rash  Active Problems:  Pneumonia  Acute respiratory failure  Sepsis  Diabetes  Hyperglycemia  Acute encephalopathy  Acute renal failure  ASSESSMENT AND PLAN  PULMONARY  Lab 03/25/12 1725  PHART 7.297*  PCO2ART 47.0*  PO2ART 70.0*  HCO3 22.7  O2SAT 91.0   Ventilator Settings: Vent Mode:  [-] PRVC FiO2 (%):  [40 %-100 %] 100 % Set Rate:  [14 bmp-16 bmp] 16 bmp Vt Set:  [600 mL] 600 mL PEEP:  [5 cmH20] 5 cmH20 Plateau Pressure:  [25 cmH20] 25 cmH20  CXR:  10/8 >>> Hardware in correct position, bilateral airspace disease ETT:  10/8 >>>  A:  Acute respiratory failure.  Pneumonia (CAP vs. Aspiration).  Less likely pulmonary edema.  Suspected COPD (without evidence of exacerbation) - smokes 2-3 packs of cigarettes per day. P:   Goal SpO2 > 92, pH . 7.30 Full mechanical support CXR in AM ABG now Daily SBT Combivent Defer systemic steroids Abx as below  CARDIOVASCULAR  Lab 03/25/12 1648 03/25/12 1647  TROPONINI -- --  LATICACIDVEN -- 5.7*  PROBNP 7610.0* --   ECG:  10/8 >>> No ST-T changes,  sinus tachycardia. Lines: L IJ TLC 10/8 >>>  A: Sepsis.  Less likely acute systolic congestive heart failure.  No arrhythmia / ischemia. P:  Goal MAP > 60 Trend lactic acid Trend COOX Trend troponin Levophed gtt A-line TTE Hold Lisinopril  RENAL  Lab 03/25/12 1647  NA 133*  K 4.2  CL 88*  CO2 20  BUN 65*  CREATININE 5.70*  CALCIUM 8.1*  MG --  PHOS --   Intake/Output    None    Foley:  10/8 >>>  A:  Dehydration.  AKI. P:   Goal CVP 10 to12 NS boluses PRN to goal CVP INF NS 100 mL/h Trend BMP  GASTROINTESTINAL  Lab 03/25/12 1647  AST 40*  ALT 33  ALKPHOS 68  BILITOT 0.5  PROT 6.8  ALBUMIN 2.8*   A:  Suspected malnutrition. P:   NPO as intubated TF if remains intubated > 24 h  HEMATOLOGIC  Lab 03/25/12 1647  HGB 12.9*  HCT 37.8*  PLT 196  INR --  APTT --   A:  No active issues. P:  Trend CBC  INFECTIOUS  Lab 03/25/12 1647  WBC 8.3  PROCALCITON 80.11   Cultures: 10/8  Blood >>> 10/8  Respiratory >>>  Antibiotics: Azithromycin 10/8 x 1 Ceftriaxone 10/8 x 1 Zosyn 10/8 >>> Vancomycin 10/8 >>> Levaquin 10/8 >>>  A:  Pneumonia (CAP vs aspiration). P:   Antibiotics / cultures as above Trend PCT  ENDOCRINE  Lab 03/25/12 1635  GLUCAP 124*   A:  DM. P:   Phase 1 ICU glycemic control protocol Hold Metformin  NEUROLOGIC  A:  Acute encephalopathy.  History of diabetic neuropathy. P:   Goal RASS 0 to -1 Daily WUA Fentanyl / Versed gtt Hold Oxycodone, Paxil, Phentermine, Lyrica, Trazodone  BEST PRACTICE / DISPOSITION Level of Care:  ICU Primary Service:  PCCM Consultants:  None Code Status:  Full Diet:  NPO DVT Px:  Protonix GI Px:  Heparin Skin Integrity:  Intact Social / Family:  Family is not available  The patient is critically ill with multiple organ systems failure and requires high complexity decision making for assessment and support, frequent evaluation and titration of therapies, application of advanced  monitoring technologies and extensive interpretation of multiple databases. Critical Care Time devoted to patient care services described in this note is 60 minutes.  Lonia Farber, M.D. Pulmonary and Critical Care Medicine [facility] Pager: [phone]  03/25/2012, 7:37 PM

## 2012-03-25 NOTE — Procedures (Signed)
Name: [name] MRN: [id number] DOB: 10/08/1960  PROCEDURE NOTE  Procedure:  Endotracheal intubation.  Indication:  Acute respiratory failure  Consent:  Consent was implied due to the emergency nature of the procedure.  Anesthesia:  A total of 10 mg of Etomidate was given intravenously.  Procedure summary:  Appropriate equipment was assembled. The patient was identified as Gregory [name] and safety timeout was performed. The patient was placed supine, with head in sniffing position. After adequate level of anesthesia was achieved, a GS#3 blade was inserted into the oropharynx and the vocal cords were visualized. A 7.5 endotracheal tube was inserted without difficulty and visualized going through the vocal cords. The stylette was removed and cuff inflated. Colorimetric change was noted on the CO2 meter. Breath sounds were heard over both lung fields equally. Post procedure chest xray was ordered.  Complications:  No immediate complications were noted.  Hemodynamic parameters and oxygenation remained stable throughout the procedure.    Orlean Bradford, M.D. Pulmonary and Critical Care Medicine [facility] Cell: [phone] Pager: [phone]  03/25/2012, 7:56 PM

## 2012-03-25 NOTE — Consult Note (Addendum)
ANTIBIOTIC CONSULT NOTE - INITIAL  Pharmacy Consult for Vancomycin and Zosyn Indication: pneumonia  No Known Allergies  Patient Measurements: Height: 5\' 11"  (180.3 cm) Weight: 248 lb (112.492 kg) IBW/kg (Calculated) : 75.3   Vital Signs: Temp: 99.5 F (37.5 C) (10/08 1945) BP: 112/60 mmHg (10/08 1945) Pulse Rate: 116  (10/08 1945) Intake/Output from previous day:   Intake/Output from this shift:    Labs:  [facility] 03/25/12 1647  WBC 8.3  HGB 12.9*  PLT 196  LABCREA --  CREATININE 5.70*   Estimated Creatinine Clearance: 19.3 ml/min (by C-G formula based on Cr of 5.7).  Microbiology: No results found for this or any previous visit (from the past 720 hour(s)).  Medical History: Past Medical History  Diagnosis Date  . Diabetes mellitus without complication   . Hypertension    Assessment: 50yom presents to ED in respiratory distress and subsequently intubated. Received one dose of azithromycin and ceftriaxone for presumed CAP vs aspiration PNA in the ED. Now to broaden to vancomycin and zosyn as patient is septic. Also in acute renal failure 2/2 dehydration. Given weight of 112kg, will dose using the obesity nomogram.  Goal of Therapy:  Vancomycin trough level 15-20 mcg/ml Appropriate zosyn dosing  Plan:  1) Vancomycin 2000mg  IV x 1 2) Follow up sCr tomorrow to determine maintenance dose 3) Zosyn 2.25g IV q6 - adjust tomorrow to 3.375g IV q8 if sCr improves 4) Follow cultures, LOT, vanc levels as indicated 5) Will adjust levaquin to 750mg  IV q48   Fredrik Rigger 03/25/2012,7:58 PM

## 2012-03-26 ENCOUNTER — Inpatient Hospital Stay (HOSPITAL_COMMUNITY): Payer: Self-pay

## 2012-03-26 DIAGNOSIS — J984 Other disorders of lung: Secondary | ICD-10-CM

## 2012-03-26 DIAGNOSIS — J13 Pneumonia due to Streptococcus pneumoniae: Secondary | ICD-10-CM

## 2012-03-26 LAB — BLOOD GAS, ARTERIAL
Acid-base deficit: 3.7 mmol/L — ABNORMAL HIGH (ref 0.0–2.0)
Acid-base deficit: 4 mmol/L — ABNORMAL HIGH (ref 0.0–2.0)
Drawn by: 347621
FIO2: 1 %
FIO2: 1 %
MECHVT: 600 mL
MECHVT: 600 mL
O2 Saturation: 91.4 %
O2 Saturation: 95.1 %
RATE: 16 resp/min
TCO2: 23.3 mmol/L (ref 0–100)
TCO2: 23.4 mmol/L (ref 0–100)
pCO2 arterial: 53.3 mmHg — ABNORMAL HIGH (ref 35.0–45.0)
pO2, Arterial: 72.5 mmHg — ABNORMAL LOW (ref 80.0–100.0)
pO2, Arterial: 100 mmHg (ref 80.0–100.0)

## 2012-03-26 LAB — POCT I-STAT 3, ART BLOOD GAS (G3+)
Acid-base deficit: 4 mmol/L — ABNORMAL HIGH (ref 0.0–2.0)
Bicarbonate: 22.8 mEq/L (ref 20.0–24.0)
Bicarbonate: 27.2 mEq/L — ABNORMAL HIGH (ref 20.0–24.0)
O2 Saturation: 83 %
Patient temperature: 100
Patient temperature: 100.3
Patient temperature: 101.7
TCO2: 25 mmol/L (ref 0–100)
TCO2: 26 mmol/L (ref 0–100)
pH, Arterial: 7.164 — CL (ref 7.350–7.450)

## 2012-03-26 LAB — CBC
HCT: 33.6 % — ABNORMAL LOW (ref 39.0–52.0)
MCHC: 33.9 g/dL (ref 30.0–36.0)
MCV: 88.4 fL (ref 78.0–100.0)
RDW: 14.4 % (ref 11.5–15.5)

## 2012-03-26 LAB — CARBOXYHEMOGLOBIN
O2 Saturation: 69.4 %
Total hemoglobin: 11.5 g/dL — ABNORMAL LOW (ref 13.5–18.0)

## 2012-03-26 LAB — GLUCOSE, CAPILLARY
Glucose-Capillary: 116 mg/dL — ABNORMAL HIGH (ref 70–99)
Glucose-Capillary: 143 mg/dL — ABNORMAL HIGH (ref 70–99)
Glucose-Capillary: 150 mg/dL — ABNORMAL HIGH (ref 70–99)

## 2012-03-26 LAB — BASIC METABOLIC PANEL
BUN: 66 mg/dL — ABNORMAL HIGH (ref 6–23)
Calcium: 6.9 mg/dL — ABNORMAL LOW (ref 8.4–10.5)
Creatinine, Ser: 4.05 mg/dL — ABNORMAL HIGH (ref 0.50–1.35)
GFR calc Af Amer: 18 mL/min — ABNORMAL LOW (ref >90)
GFR calc non Af Amer: 16 mL/min — ABNORMAL LOW (ref >90)

## 2012-03-26 LAB — INFLUENZA PANEL BY PCR (TYPE A & B)
H1N1 flu by pcr: NOT DETECTED
Influenza A By PCR: NEGATIVE

## 2012-03-26 LAB — URINE CULTURE

## 2012-03-26 MED ORDER — PHENYLEPHRINE HCL 10 MG/ML IJ SOLN
30.0000 ug/min | INTRAVENOUS | Status: DC
  Administered 2012-03-26: 30 ug/min via INTRAVENOUS
  Filled 2012-03-26 (×2): qty 1

## 2012-03-26 MED ORDER — OSELTAMIVIR PHOSPHATE 75 MG PO CAPS
75.0000 mg | ORAL_CAPSULE | Freq: Every day | ORAL | Status: DC
Start: 2012-03-26 — End: 2012-03-27
  Administered 2012-03-26: 75 mg via ORAL
  Filled 2012-03-26 (×3): qty 1

## 2012-03-26 MED ORDER — METOPROLOL TARTRATE 1 MG/ML IV SOLN
2.5000 mg | Freq: Once | INTRAVENOUS | Status: AC
  Administered 2012-03-26: 5 mg via INTRAVENOUS
  Filled 2012-03-26: qty 5

## 2012-03-26 MED ORDER — PHENYLEPHRINE HCL 10 MG/ML IJ SOLN
30.0000 ug/min | INTRAVENOUS | Status: DC
  Administered 2012-03-27 (×3): 200 ug/min via INTRAVENOUS
  Administered 2012-03-27: 150 ug/min via INTRAVENOUS
  Administered 2012-03-27: 300 ug/min via INTRAVENOUS
  Administered 2012-03-28: 150 ug/min via INTRAVENOUS
  Administered 2012-03-28: 110 ug/min via INTRAVENOUS
  Administered 2012-03-28: 200 ug/min via INTRAVENOUS
  Administered 2012-03-29: 55 ug/min via INTRAVENOUS
  Filled 2012-03-26 (×15): qty 4

## 2012-03-26 MED ORDER — JEVITY 1.2 CAL PO LIQD
1000.0000 mL | ORAL | Status: DC
  Administered 2012-03-26: 1000 mL
  Administered 2012-03-27: 17:00:00
  Administered 2012-03-28: 1000 mL
  Administered 2012-03-29 – 2012-03-31 (×2)
  Filled 2012-03-26 (×7): qty 1000

## 2012-03-26 MED ORDER — PIPERACILLIN-TAZOBACTAM 3.375 G IVPB
3.3750 g | Freq: Three times a day (TID) | INTRAVENOUS | Status: DC
  Administered 2012-03-26 – 2012-03-27 (×4): 3.375 g via INTRAVENOUS
  Filled 2012-03-26 (×6): qty 50

## 2012-03-26 MED ORDER — CHLORHEXIDINE GLUCONATE 0.12 % MT SOLN
15.0000 mL | Freq: Two times a day (BID) | OROMUCOSAL | Status: DC
  Administered 2012-03-26 – 2012-04-13 (×37): 15 mL via OROMUCOSAL
  Filled 2012-03-26 (×35): qty 15

## 2012-03-26 MED ORDER — ACETAMINOPHEN 160 MG/5ML PO SOLN
650.0000 mg | Freq: Four times a day (QID) | ORAL | Status: DC | PRN
  Administered 2012-03-26 – 2012-04-09 (×11): 650 mg
  Filled 2012-03-26 (×12): qty 20.3

## 2012-03-26 MED ORDER — SODIUM CHLORIDE 0.9 % IV BOLUS (SEPSIS)
500.0000 mL | Freq: Once | INTRAVENOUS | Status: AC
  Administered 2012-03-26: 500 mL via INTRAVENOUS

## 2012-03-26 NOTE — Progress Notes (Signed)
eLink Physician-Brief Progress Note Patient Name: Gregory [name] DOB: [birth year] MRN: [id number]  Date of Service  03/26/2012   HPI/Events of Note   AF- RVR   eICU Interventions  ABG- resp acidosis RR increased Remained in RVR - off pressors x 2h, low dose lopressor x1 CVP 9 - bolus nS x1 Echo -nml LV fn   Intervention Category Major Interventions: Arrhythmia - evaluation and management  ALVA,RAKESH V. 03/26/2012, 10:09 PM

## 2012-03-26 NOTE — Progress Notes (Signed)
CRITICAL VALUE ALERT  Critical value received:  Trop 0.61  Date of notification:  03/25/12  Time of notification:  2200  Critical value read back:yes  Nurse who received alert:  A. Canary Brim RN  MD notified (1st page):  MD Zubelevitskiy  Time of first page:  2200  MD notified (2nd page):  Time of second page:  Responding MD:  MD Z  Time MD responded:  2200

## 2012-03-26 NOTE — Progress Notes (Signed)
  Echocardiogram 2D Echocardiogram has been performed.  Chyanne Kohut 03/26/2012, 2:00 PM

## 2012-03-26 NOTE — Progress Notes (Addendum)
Name: Gregory [name] MRN: [id number] DOB: 07/17/1960    LOS: 1  Referring Provider:  EDP Reason for Referral:  Acute respiratory failure  PULMONARY / CRITICAL CARE MEDICINE  HPI:  51 yo, hx obesity, presumed OHS, COPD, DM, brought to [facility] ED on 10/8 with hypotension and respiratory distress and intubated for acute respiratory failure and diffuse B infiltrates.   Brief patient description:  51 yo brought to [facility] ED on 10/8 in respiratory distress and intubated for acute respiratory failure.  Events Since Admission: 10/8  Brought to [facility] ED on 10/8 in respiratory distress and intubated for B infiltrates, shock 10/9 placed on ARDS  Current Status: Critical    Vital Signs: Temp:  [99 F (37.2 C)-101.8 F (38.8 C)] 101.4 F (38.6 C) (10/09 1000) Pulse Rate:  [88-229] 96  (10/09 1239) Resp:  [15-37] 30  (10/09 1239) BP: (61-142)/(31-77) 98/51 mmHg (10/09 1239) SpO2:  [55 %-100 %] 100 % (10/09 1239) FiO2 (%):  [40 %-100 %] 80 % (10/09 1240) Weight:  [112.492 kg (248 lb)-120.5 kg (265 lb 10.5 oz)] 120.5 kg (265 lb 10.5 oz) (10/09 0500)  Intake/Output Summary (Last 24 hours) at 03/26/12 1346 Last data filed at 03/26/12 1000  Gross per 24 hour  Intake 5643.82 ml  Output   1675 ml  Net 3968.82 ml   Physical Examination: General:  Mechanically ventilated, synchronous Neuro:  Sedated, nonfocal, cough / gag diminished HEENT:  PERRL, purulent secretions via ETT Neck:  No JVD Cardiovascular:  RRR, tachycardic, no m/r/g Lungs:  Bilateral air entry, scattered rhonchi  Abdomen:  Soft, nontender, nondistended, bowel sounds present Musculoskeletal:  Moves allextremities Skin:  No rash  Active Problems:  Pneumonia  Acute respiratory failure  Sepsis  Diabetes  Hyperglycemia  Acute encephalopathy  Acute renal failure  ASSESSMENT AND PLAN  PULMONARY  Lab 03/26/12 0600 03/26/12 0429 03/26/12 0245 03/26/12 0238 03/25/12 1936 03/25/12 1725  PHART 7.247* 7.244* -- 7.268* -- 7.297*    PCO2ART 53.3* 57.8* -- 50.6* -- 47.0*  PO2ART 100.0 62.0* -- 72.5* -- 70.0*  HCO3 21.9 24.5* -- 21.9 -- 22.7  O2SAT 95.1 83.0 69.4 91.4 68.5 --   Ventilator Settings: Vent Mode:  [-] PRVC FiO2 (%):  [40 %-100 %] 80 % Set Rate:  [14 bmp-25 bmp] 18 bmp Vt Set:  [600 mL] 600 mL PEEP:  [5 cmH20-8 cmH20] 8 cmH20 Plateau Pressure:  [16 cmH20-31 cmH20] 31 cmH20  CXR:  10/8 and 10/9 >>> Hardware in correct position, diffuse bilateral airspace disease ETT:  10/8 >>>  A:  Acute respiratory failure.  Pneumonia (CAP vs. Viral) +/- ARDS.  Less likely pulmonary edema.  Suspected COPD (without evidence of exacerbation) - smokes 2-3 packs of cigarettes per day. P:   Goal SpO2 > 92, pH . 7.30 Full mechanical support Wean fiO2 and PEEP as able. Per ARDS protocol 10/9 Daily SBT Combivent scheduled Defer systemic steroids at this time for bronchospasm (may merit for relative adrenal insufff)Check cortisol level and consider empiric steroids till level back.  Abx as below Diuresis when BP able to tolerate, based on CVP goals  CARDIOVASCULAR  Lab 03/26/12 0737 03/26/12 0736 03/26/12 0137 03/26/12 0136 03/25/12 2007 03/25/12 2001 03/25/12 1648 03/25/12 1647  TROPONINI 1.94* -- 1.98* -- -- 0.61* -- --  LATICACIDVEN -- 1.2 -- 1.7 4.6* -- -- 5.7*  PROBNP -- -- -- -- -- -- 7610.0* --   ECG:  10/8 >>> No ST-T changes, sinus tachycardia. Lines: L IJ TLC 10/8 >>>  A: Septic Shock.  Less likely acute systolic congestive heart failure with cardiogenic shock.  NSTEMI likely secondary to shock, hypoxemia P:  Goal MAP > 60 Trend troponin to plateau Levophed gtt, add vasopressin TTE result pending Hold Lisinopril  RENAL  Lab 03/26/12 0442 03/25/12 1647  NA 133* 133*  K 3.7 4.2  CL 95* 88*  CO2 22 20  BUN 66* 65*  CREATININE 4.05* 5.70*  CALCIUM 6.9* 8.1*  MG 1.4* --  PHOS 6.1* --   Intake/Output      10/08 0701 - 10/09 0700 10/09 0701 - 10/10 0700   I.V. (mL/kg) 4623.7 (38.4) 420.1 (3.5)    IV Piggyback 600    Total Intake(mL/kg) 5223.7 (43.4) 420.1 (3.5)   Urine (mL/kg/hr) 1475 (0.5) 200 (0.2)   Total Output 1475 200   Net +3748.7 +220.1         Foley:  10/8 >>>  A:  Dehydration.  ARF likely ATN Hypomagnesemia hypophosphatemia P:   Goal CVP 10 to12 NS boluses PRN to goal CVP INF NS 100 mL/h Trend BMP  GASTROINTESTINAL  Lab 03/25/12 1647  AST 40*  ALT 33  ALKPHOS 68  BILITOT 0.5  PROT 6.8  ALBUMIN 2.8*   A:  Suspected malnutrition. Mild AST elevation P:   Follow LFT TF if remains intubated , started 10/9  HEMATOLOGIC  Lab 03/26/12 0442 03/25/12 1647  HGB 11.4* 12.9*  HCT 33.6* 37.8*  PLT 169 196  INR -- --  APTT -- --   A:  No active issues. P:  Trend CBC  INFECTIOUS  Lab 03/26/12 0442 03/25/12 1647  WBC 7.2 8.3  PROCALCITON 39.19 80.11   Cultures: 10/8  Blood >>> 10/8  Respiratory >>> 10/9 nasal influ>>  Antibiotics: Azithromycin 10/8 x 1 Ceftriaxone 10/8 x 1 Zosyn 10/8 >>> Vancomycin 10/8 >>> Levaquin 10/8 >>> tamiflu 10/9>>  A:  Pneumonia (CAP vs aspiration). P:   Antibiotics / cultures as above Trend PCT Consider viral process, viral cultures 10/9 add tamiflu  ENDOCRINE  Lab 03/26/12 0732 03/26/12 0321 03/26/12 0029 03/25/12 2106 03/25/12 1635  GLUCAP 139* 144* 150* 209* 124*   A:  DM. P:   Phase 1 ICU glycemic control protocol Hold Metformin  NEUROLOGIC  A:  Acute encephalopathy.  History of diabetic neuropathy. P:   Goal RASS 0 to -1 Daily WUA Fentanyl / Versed gtt Hold home Oxycodone, Paxil, Phentermine, Lyrica, Trazodone  BEST PRACTICE / DISPOSITION Level of Care:  ICU Primary Service:  PCCM Consultants:  None Code Status:  Full Diet:  NPO DVT Px:  Protonix GI Px:  Heparin Skin Integrity:  Intact Social / Family:  Family is not available  The patient is critically ill with multiple organ systems failure and requires high complexity decision making for assessment and support, frequent  evaluation and titration of therapies, application of advanced monitoring technologies and extensive interpretation of multiple databases. Critical Care Time devoted to patient care services described in this note is 45 minutes.  Leslye Peer., M.D. Pulmonary and Critical Care Medicine [facility] Pager: [phone]  03/26/2012, 1:42 PM

## 2012-03-26 NOTE — Progress Notes (Signed)
ANTIBIOTIC CONSULT NOTE - FOLLOW UP  Pharmacy Consult for Vancomycin, Zosyn, Levaquin Indication: pneumonia  No Known Allergies  Patient Measurements: Height: 6\' 1"  (185.4 cm) Weight: 265 lb 10.5 oz (120.5 kg) IBW/kg (Calculated) : 79.9   Vital Signs: Temp: 101 F (38.3 C) (10/09 0700) Temp src: Core (Comment) (10/09 0800) BP: 95/58 mmHg (10/09 0825) Pulse Rate: 98  (10/09 0825) Intake/Output from previous day: 10/08 0701 - 10/09 0700 In: 5223.7 [I.V.:4623.7; IV Piggyback:600] Out: 1475 [Urine:1475] Intake/Output from this shift: Total I/O In: 142.8 [I.V.:142.8] Out: 200 [Urine:200]  Labs:  [facility] 03/26/12 0442 03/25/12 1647  WBC 7.2 8.3  HGB 11.4* 12.9*  PLT 169 196  LABCREA -- --  CREATININE 4.05* 5.70*   Estimated Creatinine Clearance: 29 ml/min (by C-G formula based on Cr of 4.05). No results found for this basename: VANCOTROUGH:2,VANCOPEAK:2,VANCORANDOM:2,GENTTROUGH:2,GENTPEAK:2,GENTRANDOM:2,TOBRATROUGH:2,TOBRAPEAK:2,TOBRARND:2,AMIKACINPEAK:2,AMIKACINTROU:2,AMIKACIN:2, in the last 72 hours   Microbiology: Recent Results (from the past 720 hour(s))  MRSA PCR SCREENING     Status: Normal   Collection Time   03/25/12  8:58 PM      Component Value Range Status Comment   MRSA by PCR NEGATIVE  NEGATIVE Final   CULTURE, RESPIRATORY     Status: Normal (Preliminary result)   Collection Time   03/25/12  9:15 PM      Component Value Range Status Comment   Specimen Description TRACHEAL ASPIRATE   Final    Special Requests NONE   Final    Gram Stain PENDING   Incomplete    Culture NO GROWTH 1 DAY   Final    Report Status PENDING   Incomplete     Anti-infectives     Start     Dose/Rate Route Frequency Ordered Stop   03/27/12 2030   levofloxacin (LEVAQUIN) IVPB 750 mg        750 mg 100 mL/hr over 90 Minutes Intravenous Every 48 hours 03/25/12 2007     03/26/12 0230  piperacillin-tazobactam (ZOSYN) IVPB 2.25 g       2.25 g 100 mL/hr over 30 Minutes Intravenous 4  times per day 03/25/12 2336     03/25/12 2015  piperacillin-tazobactam (ZOSYN) IVPB 2.25 g       2.25 g 100 mL/hr over 30 Minutes Intravenous  Once 03/25/12 2005 03/25/12 2216   03/25/12 2015   vancomycin (VANCOCIN) 2,000 mg in sodium chloride 0.9 % 500 mL IVPB        2,000 mg 250 mL/hr over 120 Minutes Intravenous  Once 03/25/12 2005 03/26/12 0040   03/25/12 1945   levofloxacin (LEVAQUIN) IVPB 750 mg  Status:  Discontinued        750 mg 100 mL/hr over 90 Minutes Intravenous Every 24 hours 03/25/12 1937 03/25/12 2007   03/25/12 1645   cefTRIAXone (ROCEPHIN) 1 g in dextrose 5 % 50 mL IVPB        1 g 100 mL/hr over 30 Minutes Intravenous  Once 03/25/12 1634 03/25/12 1729   03/25/12 1645   azithromycin (ZITHROMAX) 500 mg in dextrose 5 % 250 mL IVPB        500 mg 250 mL/hr over 60 Minutes Intravenous  Once 03/25/12 1634 03/25/12 1815          Assessment: Pneumonia:  On Day #2 of empiric therapy with Vancomycin, Zosyn, and Levaquin.  He is in ARF but has reasonable urine output documented.  His Levaquin regimen remains appropriate and he was loaded with Vancomycin on 10/8.  I do not anticipate he will  need a Vancomycin maintenance dose until 10/10.  His Zosyn regimen can be adjusted for better PK/PD target attainment.  Goal of Therapy:  Vancomycin trough level 15-20 mcg/ml  Plan:  Change Zosyn to 3.375gm IV q8h extended infusion Continue Levaquin 750mg  IV q48h Follow-up renal function on 10/10 to determine subsequent Vancomycin doses.  Estella Husk, Pharm.D., BCPS Clinical Pharmacist  Phone [phone] Pager [phone] 03/26/2012, 9:07 AM

## 2012-03-26 NOTE — ED Provider Notes (Signed)
I have personally seen and examined the patient.  I have discussed the plan of care with the resident.  I have reviewed the documentation on PMH/FH/Soc. History.  I have reviewed the documentation of the resident and agree.  Doug Sou, MD 03/26/12 [phone]

## 2012-03-27 ENCOUNTER — Inpatient Hospital Stay (HOSPITAL_COMMUNITY): Payer: Self-pay

## 2012-03-27 LAB — GLUCOSE, CAPILLARY
Glucose-Capillary: 196 mg/dL — ABNORMAL HIGH (ref 70–99)
Glucose-Capillary: 207 mg/dL — ABNORMAL HIGH (ref 70–99)

## 2012-03-27 LAB — BASIC METABOLIC PANEL
GFR calc Af Amer: 14 mL/min — ABNORMAL LOW (ref >90)
GFR calc non Af Amer: 12 mL/min — ABNORMAL LOW (ref >90)
Potassium: 5.2 mEq/L — ABNORMAL HIGH (ref 3.5–5.1)
Sodium: 132 mEq/L — ABNORMAL LOW (ref 135–145)

## 2012-03-27 LAB — COMPREHENSIVE METABOLIC PANEL
ALT: 102 U/L — ABNORMAL HIGH (ref 0–53)
AST: 255 U/L — ABNORMAL HIGH (ref 0–37)
Albumin: 2.1 g/dL — ABNORMAL LOW (ref 3.5–5.2)
Alkaline Phosphatase: 112 U/L (ref 39–117)
BUN: 85 mg/dL — ABNORMAL HIGH (ref 6–23)
Chloride: 97 mEq/L (ref 96–112)
Potassium: 4.7 mEq/L (ref 3.5–5.1)
Sodium: 135 mEq/L (ref 135–145)
Total Bilirubin: 0.2 mg/dL — ABNORMAL LOW (ref 0.3–1.2)

## 2012-03-27 LAB — CBC
MCHC: 32.4 g/dL (ref 30.0–36.0)
Platelets: 135 10*3/uL — ABNORMAL LOW (ref 150–400)
RDW: 14.9 % (ref 11.5–15.5)

## 2012-03-27 LAB — POCT I-STAT 3, ART BLOOD GAS (G3+)
Acid-base deficit: 8 mmol/L — ABNORMAL HIGH (ref 0.0–2.0)
Bicarbonate: 21 mEq/L (ref 20.0–24.0)
Bicarbonate: 20.5 mEq/L (ref 20.0–24.0)
O2 Saturation: 93 %
Patient temperature: 99.8
TCO2: 22 mmol/L (ref 0–100)
pCO2 arterial: 52.5 mmHg — ABNORMAL HIGH (ref 35.0–45.0)
pO2, Arterial: 85 mmHg (ref 80.0–100.0)

## 2012-03-27 LAB — MAGNESIUM: Magnesium: 1.7 mg/dL (ref 1.5–2.5)

## 2012-03-27 LAB — CORTISOL: Cortisol, Plasma: 11 ug/dL

## 2012-03-27 LAB — PHOSPHORUS: Phosphorus: 8.2 mg/dL — ABNORMAL HIGH (ref 2.3–4.6)

## 2012-03-27 MED ORDER — LEVOFLOXACIN IN D5W 250 MG/50ML IV SOLN
250.0000 mg | INTRAVENOUS | Status: DC
  Administered 2012-03-27: 250 mg via INTRAVENOUS
  Filled 2012-03-27 (×2): qty 50

## 2012-03-27 MED ORDER — HYDROCORTISONE SOD SUCCINATE 100 MG IJ SOLR
100.0000 mg | Freq: Three times a day (TID) | INTRAMUSCULAR | Status: DC
  Administered 2012-03-27 – 2012-03-31 (×14): 100 mg via INTRAVENOUS
  Filled 2012-03-27 (×17): qty 2

## 2012-03-27 MED ORDER — PRISMASOL BGK 4/2.5 32-4-2.5 MEQ/L IV SOLN
INTRAVENOUS | Status: DC
  Administered 2012-03-27 – 2012-04-02 (×37): via INTRAVENOUS_CENTRAL
  Filled 2012-03-27 (×58): qty 5000

## 2012-03-27 MED ORDER — AMIODARONE HCL IN DEXTROSE 360-4.14 MG/200ML-% IV SOLN
0.5000 mg/min | INTRAVENOUS | Status: DC
  Administered 2012-03-27 – 2012-03-29 (×5): 0.5 mg/min via INTRAVENOUS
  Filled 2012-03-27 (×12): qty 200

## 2012-03-27 MED ORDER — SODIUM CHLORIDE 0.9 % IV BOLUS (SEPSIS)
500.0000 mL | Freq: Once | INTRAVENOUS | Status: AC
  Administered 2012-03-27: 500 mL via INTRAVENOUS

## 2012-03-27 MED ORDER — VASOPRESSIN 20 UNIT/ML IJ SOLN
0.0300 [IU]/min | INTRAVENOUS | Status: DC
  Administered 2012-03-27 – 2012-03-29 (×3): 0.03 [IU]/min via INTRAVENOUS
  Filled 2012-03-27 (×3): qty 2.5

## 2012-03-27 MED ORDER — SODIUM CHLORIDE 0.9 % IV SOLN
1.0000 g | Freq: Once | INTRAVENOUS | Status: AC
  Administered 2012-03-27: 1 g via INTRAVENOUS
  Filled 2012-03-27: qty 10

## 2012-03-27 MED ORDER — PRISMASOL BGK 4/2.5 32-4-2.5 MEQ/L IV SOLN
INTRAVENOUS | Status: DC
  Administered 2012-03-27 – 2012-03-31 (×8): via INTRAVENOUS_CENTRAL
  Filled 2012-03-27 (×6): qty 5000

## 2012-03-27 MED ORDER — HEPARIN SODIUM (PORCINE) 1000 UNIT/ML DIALYSIS
1000.0000 [IU] | INTRAMUSCULAR | Status: DC | PRN
  Filled 2012-03-27: qty 6

## 2012-03-27 MED ORDER — VANCOMYCIN HCL 1000 MG IV SOLR
1250.0000 mg | INTRAVENOUS | Status: DC
  Administered 2012-03-27: 1250 mg via INTRAVENOUS
  Filled 2012-03-27 (×2): qty 1250

## 2012-03-27 MED ORDER — AMIODARONE LOAD VIA INFUSION
150.0000 mg | Freq: Once | INTRAVENOUS | Status: AC
  Administered 2012-03-27: 150 mg via INTRAVENOUS
  Filled 2012-03-27: qty 83.34

## 2012-03-27 MED ORDER — PRISMASOL BGK 4/2.5 32-4-2.5 MEQ/L IV SOLN
INTRAVENOUS | Status: DC
  Administered 2012-03-27 – 2012-04-01 (×6): via INTRAVENOUS_CENTRAL
  Filled 2012-03-27 (×9): qty 5000

## 2012-03-27 MED ORDER — PIPERACILLIN-TAZOBACTAM 3.375 G IVPB
3.3750 g | Freq: Four times a day (QID) | INTRAVENOUS | Status: DC
  Administered 2012-03-28 (×3): 3.375 g via INTRAVENOUS
  Filled 2012-03-27 (×4): qty 50

## 2012-03-27 MED ORDER — HEPARIN SODIUM (PORCINE) 1000 UNIT/ML IJ SOLN
INTRAMUSCULAR | Status: AC
  Administered 2012-03-27: 1000 [IU]
  Filled 2012-03-27: qty 3

## 2012-03-27 MED ORDER — SODIUM CHLORIDE 0.9 % IV SOLN
INTRAVENOUS | Status: DC
  Administered 2012-03-27 – 2012-04-03 (×4): via INTRAVENOUS

## 2012-03-27 MED ORDER — AMIODARONE HCL IN DEXTROSE 360-4.14 MG/200ML-% IV SOLN
1.0000 mg/min | INTRAVENOUS | Status: AC
  Administered 2012-03-27 (×2): 1 mg/min via INTRAVENOUS
  Filled 2012-03-27: qty 200

## 2012-03-27 MED ORDER — SODIUM CHLORIDE 0.9 % FOR CRRT
INTRAVENOUS_CENTRAL | Status: DC | PRN
  Filled 2012-03-27: qty 1000

## 2012-03-27 NOTE — Progress Notes (Addendum)
INITIAL ADULT NUTRITION ASSESSMENT Date: 03/27/2012   Time: 9:12 AM  Reason for Assessment: VDRF; New TF  INTERVENTION:  Given ARDS diagnosis, recommend changing TF to Oxepa at 10 ml/h, increase by 10 ml every 4 hours as tolerated to goal rate of 40 ml/h with Prostat 60 ml TID to provide 2040 kcals (24 kcals/kg IBW), 150 gm protein, 754 ml free water daily.  If Jevity 1.2 continues, recommend advance rate by 10 ml every 4 hours as tolerated to goal rate of 50 ml/h with Prostat 60 ml TID to provide 2040 kcals (24 kcals/kg IBW), 157 gm protein, 972 ml free water daily.   DOCUMENTATION CODES Per approved criteria  -Obesity Unspecified   ASSESSMENT: Male 51 y.o.  Dx: Hypotension; acute respiratory failure; ARDS  Hx:  Past Medical History  Diagnosis Date  . Diabetes mellitus without complication   . Hypertension     History reviewed. No pertinent past surgical history.  Related Meds:  Scheduled Meds:   . albuterol-ipratropium  6 puff Inhalation Q4H  . amiodarone  150 mg Intravenous Once  . antiseptic oral rinse  1 application Mouth Rinse QID  . calcium gluconate  1 g Intravenous Once  . chlorhexidine  15 mL Mouth/Throat BID  . feeding supplement (JEVITY 1.2 CAL)  1,000 mL Per Tube Q24H  . heparin subcutaneous  5,000 Units Subcutaneous Q8H  . hydrocortisone sod succinate (SOLU-CORTEF) injection  100 mg Intravenous Q8H  . insulin aspart  2-6 Units Subcutaneous Q4H  . levofloxacin (LEVAQUIN) IV  750 mg Intravenous Q48H  . metoprolol  2.5-5 mg Intravenous Once  . pantoprazole (PROTONIX) IV  40 mg Intravenous Q24H  . piperacillin-tazobactam (ZOSYN)  IV  3.375 g Intravenous Q8H  . sodium chloride  1,000 mL Intravenous Once  . sodium chloride  500 mL Intravenous Once  . sodium chloride  500 mL Intravenous Once  . sodium chloride  500 mL Intravenous Once  . DISCONTD: oseltamivir  75 mg Oral Daily   Continuous Infusions:   . amiodarone (NEXTERONE PREMIX) 360 mg/200 mL  dextrose 1 mg/min (03/27/12 0327)   Followed by  . amiodarone (NEXTERONE PREMIX) 360 mg/200 mL dextrose 0.5 mg/min (03/27/12 0625)  . fentaNYL infusion INTRAVENOUS 25 mcg/hr (03/27/12 0625)  . midazolam (VERSED) infusion 2 mg/hr (03/27/12 0625)  . phenylephrine (NEO-SYNEPHRINE) Adult infusion 300 mcg/min (03/27/12 0654)  . vasopressin (PITRESSIN) infusion - *FOR SHOCK* 0.03 Units/min (03/27/12 0101)  . DISCONTD: sodium chloride 100 mL/hr at 03/26/12 1336  . DISCONTD: norepinephrine (LEVOPHED) Adult infusion Stopped (03/26/12 2115)  . DISCONTD: phenylephrine (NEO-SYNEPHRINE) Adult infusion 30 mcg/min (03/26/12 2332)   PRN Meds:.acetaminophen (TYLENOL) oral liquid 160 mg/5 mL, albuterol-ipratropium   Ht: 6\' 1"  (185.4 cm)  Wt: 270 lb 1 oz (122.5 kg)  Ideal Wt: 83.6 kg % Ideal Wt: 147%  Usual Wt:  Wt Readings from Last 25 Encounters:  03/27/12 270 lb 1 oz (122.5 kg)  03/22/12 248 lb (112.492 kg) on admission  % Usual Wt: 109%  Body mass index is 35.63 kg/(m^2). BMI=32.7 using admission weight (obesity, class I)  Food/Nutrition Related Hx: unknown  Labs:  CMP     Component Value Date/Time   NA 132* 03/27/2012 0500   K 5.2* 03/27/2012 0500   CL 95* 03/27/2012 0500   CO2 20 03/27/2012 0500   GLUCOSE 230* 03/27/2012 0500   BUN 77* 03/27/2012 0500   CREATININE 5.14* 03/27/2012 0500   CALCIUM 6.7* 03/27/2012 0500   PROT 6.8 03/25/2012 1647   ALBUMIN  2.8* 03/25/2012 1647   AST 40* 03/25/2012 1647   ALT 33 03/25/2012 1647   ALKPHOS 68 03/25/2012 1647   BILITOT 0.5 03/25/2012 1647   GFRNONAA 12* 03/27/2012 0500   GFRAA 14* 03/27/2012 0500    CBG (last 3)   Basename 03/27/12 0741 03/27/12 0413 03/27/12 0025  GLUCAP 196* 185* 172*     Intake/Output Summary (Last 24 hours) at 03/27/12 0919 Last data filed at 03/27/12 0700  Gross per 24 hour  Intake 4082.7 ml  Output    550 ml  Net 3532.7 ml     Diet Order: NPO  Tube Feeding order:  Jevity 1.2 at 10 ml/h providing  288 kcals, 13 gm protein, 194 ml free water daily  IVF:    amiodarone (NEXTERONE PREMIX) 360 mg/200 mL dextrose Last Rate: 1 mg/min (03/27/12 0327)  Followed by   amiodarone (NEXTERONE PREMIX) 360 mg/200 mL dextrose Last Rate: 0.5 mg/min (03/27/12 0625)  fentaNYL infusion INTRAVENOUS Last Rate: 25 mcg/hr (03/27/12 0625)  midazolam (VERSED) infusion Last Rate: 2 mg/hr (03/27/12 0625)  phenylephrine (NEO-SYNEPHRINE) Adult infusion Last Rate: 300 mcg/min (03/27/12 0654)  vasopressin (PITRESSIN) infusion - *FOR SHOCK* Last Rate: 0.03 Units/min (03/27/12 0101)  DISCONTD: sodium chloride Last Rate: 100 mL/hr at 03/26/12 1336  DISCONTD: norepinephrine (LEVOPHED) Adult infusion Last Rate: Stopped (03/26/12 2115)  DISCONTD: phenylephrine (NEO-SYNEPHRINE) Adult infusion Last Rate: 30 mcg/min (03/26/12 2332)    Estimated Nutritional Needs:   Kcal: 2600 Protein: 150-165 grams Fluid: 2.6 liters  Patient is currently intubated on ventilator support.  MV: 15.1 Temp:Temp (24hrs), Avg:100 F (37.8 C), Min:98.4 F (36.9 C), Max:101.1 F (38.4 C)  Jevity 1.2 has been running at 10 ml/h since yesterday afternoon.  Tolerating well per RN except slightly distended abdomen this morning.  ARDS protocol is in place.  Weight is trending up with positive fluid balance.  Discussed patient in rounds; plans to start CRRT today.  NUTRITION DIAGNOSIS: -Inadequate oral intake (NI-2.1).  Status: Ongoing  RELATED TO:  Inability to eat  AS EVIDENCED BY:  NPO status  MONITORING/EVALUATION(Goals):  Goal:  Enteral nutrition to provide 60-70% of estimated calorie needs (22-25 kcals/kg ideal body weight) and >/= 90% of estimated protein needs, based on ASPEN guidelines for permissive underfeeding in critically ill obese individuals  Monitor:  TF tolerance/adequacy, weight trend, labs, I/O   EDUCATION NEEDS: -Education not appropriate at this time   Joaquin Courts, RD, LDN, CNSC Pager# [phone] After  Hours Pager# [phone]  03/27/2012, 9:12 AM

## 2012-03-27 NOTE — Progress Notes (Signed)
PGY-2 Progress Note:  Spoke with patient's oldest daughter, Jamarrion Budai, via telephone. Updated her on her father's status. Explained that he may likely need dialysis given his ARF. Megan had appropriate questions, and has consented to HD catheter placement. (Witnessed by Nicholes Calamity, RN) I told her the nephrologists would evaluate patient today and make a decision about if/when to start dialysis, and she agrees.  Aundra Millet will contact the unit if she has any further questions.  Vincient Vanaman M. Danyel Griess, M.D. 03/27/2012 10:19 AM

## 2012-03-27 NOTE — Significant Event (Signed)
Cortisol level 11.  Remains on phenylephrine and vasopressin.  Will add solucortef.  Coralyn Helling, MD 03/27/2012, 12:51 AM [phone]

## 2012-03-27 NOTE — Progress Notes (Signed)
Patient to have HD cath placed.  Gregory Rainbow, NP is going to place cath.  VS are stable. Time out was done and correct.  Dayna Alia B

## 2012-03-27 NOTE — Progress Notes (Addendum)
ANTIBIOTIC CONSULT NOTE - FOLLOW UP  Pharmacy Consult for vancomycin, Zosyn, and Levaquin Indication: pneumonia  No Known Allergies  Patient Measurements: Height: 6\' 1"  (185.4 cm) Weight: 270 lb 1 oz (122.5 kg) IBW/kg (Calculated) : 79.9  Adjusted Body Weight: 96.9 kg  Vital Signs: Temp: 98.1 F (36.7 C) (10/10 1600) Temp src: Oral (10/10 1200) BP: 98/63 mmHg (10/10 1605) Pulse Rate: 127  (10/10 1605) Intake/Output from previous day: 10/09 0701 - 10/10 0700 In: 4364.4 [I.V.:2514.4; NG/GT:250; IV Piggyback:1600] Out: 755 [Urine:755] Intake/Output from this shift: Total I/O In: 1040.1 [I.V.:800.1; NG/GT:80; IV Piggyback:160] Out: 10 [Urine:10]  Labs:  [facility] 03/27/12 0500 03/26/12 0442 03/25/12 1647  WBC 7.8 7.2 8.3  HGB 10.9* 11.4* 12.9*  PLT 135* 169 196  LABCREA -- -- --  CREATININE 5.14* 4.05* 5.70*   Estimated Creatinine Clearance: 23 ml/min (by C-G formula based on Cr of 5.14).  Microbiology: Recent Results (from the past 720 hour(s))  URINE CULTURE     Status: Normal   Collection Time   03/25/12  4:50 PM      Component Value Range Status Comment   Specimen Description URINE, CATHETERIZED   Final    Special Requests NONE   Final    Culture  Setup Time 03/25/2012 17:58   Final    Colony Count NO GROWTH   Final    Culture NO GROWTH   Final    Report Status 03/26/2012 FINAL   Final   CULTURE, BLOOD (ROUTINE X 2)     Status: Normal (Preliminary result)   Collection Time   03/25/12  4:55 PM      Component Value Range Status Comment   Specimen Description BLOOD ARM RIGHT   Final    Special Requests     Final    Value: BOTTLES DRAWN AEROBIC AND ANAEROBIC BLUE 10CC RED 5CC   Culture  Setup Time 03/26/2012 01:32   Final    Culture     Final    Value: STREPTOCOCCUS SPECIES     Note: Gram Stain Report Called to,Read Back By and Verified With: KELSEY WHITE @ 1519 03/26/12 BY KRAWS   Report Status PENDING   Incomplete   CULTURE, BLOOD (ROUTINE X 2)     Status:  Normal (Preliminary result)   Collection Time   03/25/12  5:15 PM      Component Value Range Status Comment   Specimen Description BLOOD HAND RIGHT   Final    Special Requests BOTTLES DRAWN AEROBIC ONLY 4CC   Final    Culture  Setup Time 03/26/2012 01:32   Final    Culture     Final    Value: STREPTOCOCCUS SPECIES     Note: Gram Stain Report Called to,Read Back By and Verified With: [facility] WHITE @ 1519 03/26/12 BY KRAWS   Report Status PENDING   Incomplete   MRSA PCR SCREENING     Status: Normal   Collection Time   03/25/12  8:58 PM      Component Value Range Status Comment   MRSA by PCR NEGATIVE  NEGATIVE Final   CULTURE, RESPIRATORY     Status: Normal (Preliminary result)   Collection Time   03/25/12  9:15 PM      Component Value Range Status Comment   Specimen Description TRACHEAL ASPIRATE   Final    Special Requests NONE   Final    Gram Stain     Final    Value: MODERATE WBC PRESENT, PREDOMINANTLY PMN  NO SQUAMOUS EPITHELIAL CELLS SEEN     FEW GRAM POSITIVE RODS     FEW GRAM POSITIVE COCCI IN PAIRS     IN CHAINS RARE GRAM NEGATIVE RODS   Culture FEW CANDIDA ALBICANS   Final    Report Status PENDING   Incomplete     Anti-infectives     Start     Dose/Rate Route Frequency Ordered Stop   03/27/12 2200   vancomycin (VANCOCIN) 1,250 mg in sodium chloride 0.9 % 250 mL IVPB        1,250 mg 166.7 mL/hr over 90 Minutes Intravenous Every 24 hours 03/27/12 1452     03/27/12 2030   levofloxacin (LEVAQUIN) IVPB 750 mg        750 mg 100 mL/hr over 90 Minutes Intravenous Every 48 hours 03/25/12 2007     03/26/12 1600   oseltamivir (TAMIFLU) capsule 75 mg  Status:  Discontinued        75 mg Oral Daily 03/26/12 1442 03/27/12 0824   03/26/12 1400  piperacillin-tazobactam (ZOSYN) IVPB 3.375 g       3.375 g 12.5 mL/hr over 240 Minutes Intravenous 3 times per day 03/26/12 0907     03/26/12 0230   piperacillin-tazobactam (ZOSYN) IVPB 2.25 g  Status:  Discontinued        2.25 g 100  mL/hr over 30 Minutes Intravenous 4 times per day 03/25/12 2336 03/26/12 0907   03/25/12 2015  piperacillin-tazobactam (ZOSYN) IVPB 2.25 g       2.25 g 100 mL/hr over 30 Minutes Intravenous  Once 03/25/12 2005 03/25/12 2216   03/25/12 2015   vancomycin (VANCOCIN) 2,000 mg in sodium chloride 0.9 % 500 mL IVPB        2,000 mg 250 mL/hr over 120 Minutes Intravenous  Once 03/25/12 2005 03/26/12 0040   03/25/12 1945   levofloxacin (LEVAQUIN) IVPB 750 mg  Status:  Discontinued        750 mg 100 mL/hr over 90 Minutes Intravenous Every 24 hours 03/25/12 1937 03/25/12 2007   03/25/12 1645   cefTRIAXone (ROCEPHIN) 1 g in dextrose 5 % 50 mL IVPB        1 g 100 mL/hr over 30 Minutes Intravenous  Once 03/25/12 1634 03/25/12 1729   03/25/12 1645   azithromycin (ZITHROMAX) 500 mg in dextrose 5 % 250 mL IVPB        500 mg 250 mL/hr over 60 Minutes Intravenous  Once 03/25/12 1634 03/25/12 1815          Assessment: 50 YOM on day 3 of empiric therapy for pneumonia. He is in ARF with increasing serum creatinine and decreasing urine output. He had a HD catheter placed today and will be starting CVVHD. His antibiotics now need to be adjusted due to this. His WBC is 7.8 and he is afebrile.   Goal of Therapy:  Vancomycin trough level 15-20 mcg/ml Appropriate dosing of antibiotics for renal function and dialysis plans  Plan:  1. Start vancomycin 1250mg  IV Q24 hr 2. Change Levaquin to 250mg  IV Q24 hr 3. Change Zosyn to 3.375gm IV Q6 hr 4. Will follow cultures and sensitivities to tailor regimen when possible 5. Will check a vancomycin level when clinically indicated 6. Will follow renal function and hemodialysis plans  Myalynn Lingle D. Tarris Delbene, PharmD Clinical Pharmacist Pager: [phone] Phone: [phone] 03/27/2012 5:38 PM

## 2012-03-27 NOTE — Consult Note (Signed)
[facility] Consult Note [facility]  Date: 03/27/2012  Patient name: Gregory [name] Medical record number: [id number] Date of birth: 01-28-1961 Age: 51 y.o. Gender: male PCP: Annie Paras, MD  Medical Service: Critical Care  Conulting physician: Amber Hairford     Chief Complaint: SOB, ARD  History of Present Illness: History is obtained from patients chart as pt is intubated and sedated.   [name] is 51 yo male with a PMH of DM w/complications, HTN, depression, current 2 pack a day smoker, and obesity.  He presented to the ED on 10/8 with a five day history of cough with yellow sputum production. He had been treated as an outpatient and when he presented to the ED was in ARDs, encephalopathic, and suffering from septic shock. He was intubated due to declining O2 sats and increased work of breathing. On 10/9 he suffered an NSTEMI presumably from septic shock and has since been in afib. His blood cultures were positive for strep. We are consulted for elevated cr. 5.14 and need for HD. Urine output has remained low and his Cr has been elevated since admission.   Meds: Prior to Admission medications   Medication Sig Start Date End Date Taking? Authorizing Provider  insulin NPH-insulin regular (NOVOLIN 70/30) (70-30) 100 UNIT/ML injection Inject 35 Units into the skin daily.   Yes Historical Provider, MD  lisinopril (PRINIVIL,ZESTRIL) 5 MG tablet Take 5 mg by mouth daily.   Yes Historical Provider, MD  metFORMIN (GLUCOPHAGE) 1000 MG tablet Take 1,000 mg by mouth 2 (two) times daily with a meal.   Yes Historical Provider, MD  oxycodone (ROXICODONE) 30 MG immediate release tablet Take 30 mg by mouth every 4 (four) hours.   Yes Historical Provider, MD  PARoxetine (PAXIL) 40 MG tablet Take 40 mg by mouth every morning.   Yes Historical Provider, MD  phentermine (ADIPEX-P) 37.5 MG tablet Take 37.5 mg by mouth daily before breakfast.   Yes Historical  Provider, MD  pregabalin (LYRICA) 300 MG capsule Take 300 mg by mouth 2 (two) times daily.   Yes Historical Provider, MD  traZODone (DESYREL) 150 MG tablet Take 150 mg by mouth at bedtime.   Yes Historical Provider, MD   Current facility-administered medications:acetaminophen (TYLENOL) solution 650 mg, 650 mg, Per Tube, Q6H PRN, Zigmund Gottron, MD, 650 mg at 03/26/12 0921;  albuterol-ipratropium (COMBIVENT) inhaler 6 puff, 6 puff, Inhalation, Q2H PRN, Lonia Farber, MD;  albuterol-ipratropium (COMBIVENT) inhaler 6 puff, 6 puff, Inhalation, Q4H, Lonia Farber, MD, 6 puff at 03/27/12 0912 amiodarone (NEXTERONE PREMIX) 360 mg/200 mL dextrose IV infusion, 1 mg/min, Intravenous, Continuous, Coralyn Helling, MD, Last Rate: 33.3 mL/hr at 03/27/12 0327, 1 mg/min at 03/27/12 0327;  amiodarone (NEXTERONE PREMIX) 360 mg/200 mL dextrose IV infusion, 0.5 mg/min, Intravenous, Continuous, Coralyn Helling, MD, Last Rate: 16.7 mL/hr at 03/27/12 1233, 0.5 mg/min at 03/27/12 1233 amiodarone (NEXTERONE) 1.8 mg/mL load via infusion 150 mg, 150 mg, Intravenous, Once, Coralyn Helling, MD, Last Rate: 500.1 mL/hr at 03/27/12 0015, 150 mg at 03/27/12 0015;  antiseptic oral rinse (BIOTENE) solution 15 mL, 1 application, Mouth Rinse, QID, Lonia Farber, MD, 15 mL at 03/27/12 1200;  calcium gluconate 1 g in sodium chloride 0.9 % 100 mL IVPB, 1 g, Intravenous, Once, Hilarie Fredrickson, MD, 1 g at 03/27/12 1018 chlorhexidine (PERIDEX) 0.12 % solution 15 mL, 15 mL, Mouth/Throat, BID, Konstantin Zubelevitskiy, MD, 15 mL at 03/27/12 0800;  feeding supplement (JEVITY 1.2 CAL) liquid 1,000 mL, 1,000 mL,  Per Tube, Q24H, Vilinda Blanks Minor, NP, Last Rate: 10 mL/hr at 03/26/12 1534, 1,000 mL at 03/26/12 1534 fentaNYL (SUBLIMAZE) 10 mcg/mL in sodium chloride 0.9 % 250 mL infusion, 25-400 mcg/hr, Intravenous, Continuous, Lonia Farber, MD, Last Rate: 2.5 mL/hr at 03/27/12 0625, 25 mcg/hr at 03/27/12 0625;  heparin  1000 UNIT/ML injection, , , , ;  heparin injection 5,000 Units, 5,000 Units, Subcutaneous, Q8H, Lonia Farber, MD, 5,000 Units at 03/27/12 0543 hydrocortisone sodium succinate (SOLU-CORTEF) 100 mg/2 mL injection 100 mg, 100 mg, Intravenous, Q8H, Coralyn Helling, MD, 100 mg at 03/27/12 0847;  insulin aspart (novoLOG) injection 2-6 Units, 2-6 Units, Subcutaneous, Q4H, Lonia Farber, MD, 4 Units at 03/27/12 0847;  levofloxacin (LEVAQUIN) IVPB 750 mg, 750 mg, Intravenous, Q48H, Doug Sou, MD metoprolol (LOPRESSOR) injection 2.5-5 mg, 2.5-5 mg, Intravenous, Once, Oretha Milch, MD, 5 mg at 03/26/12 2249;  midazolam (VERSED) 1 mg/mL in sodium chloride 0.9 % 50 mL infusion, 1-10 mg/hr, Intravenous, Continuous, Lonia Farber, MD, Last Rate: 2 mL/hr at 03/27/12 1000, 2 mg/hr at 03/27/12 1000;  pantoprazole (PROTONIX) injection 40 mg, 40 mg, Intravenous, Q24H, Lonia Farber, MD, 40 mg at 03/26/12 1749 phenylephrine (NEO-SYNEPHRINE) 40,000 mcg in dextrose 5 % 250 mL infusion, 30-300 mcg/min, Intravenous, Continuous, Coralyn Helling, MD, Last Rate: 65.6 mL/hr at 03/27/12 1232, 175 mcg/min at 03/27/12 1232;  piperacillin-tazobactam (ZOSYN) IVPB 3.375 g, 3.375 g, Intravenous, Q8H, Madolyn Frieze, PHARMD, 3.375 g at 03/27/12 0555;  sodium chloride 0.9 % bolus 1,000 mL, 1,000 mL, Intravenous, Once, Lonia Farber, MD sodium chloride 0.9 % bolus 500 mL, 500 mL, Intravenous, Once, Oretha Milch, MD, 500 mL at 03/26/12 2215;  sodium chloride 0.9 % bolus 500 mL, 500 mL, Intravenous, Once, Coralyn Helling, MD, 500 mL at 03/27/12 0020;  sodium chloride 0.9 % bolus 500 mL, 500 mL, Intravenous, Once, Coralyn Helling, MD, 500 mL at 03/27/12 0559 vasopressin (PITRESSIN) 50 Units in sodium chloride 0.9 % 250 mL infusion, 0.03 Units/min, Intravenous, Continuous, Coralyn Helling, MD, Last Rate: 9 mL/hr at 03/27/12 0101, 0.03 Units/min at 03/27/12 0101;  DISCONTD: 0.9 %  sodium chloride  infusion, , Intravenous, Continuous, Lonia Farber, MD, Last Rate: 100 mL/hr at 03/26/12 1336 DISCONTD: norepinephrine (LEVOPHED) 16 mg in dextrose 5 % 250 mL infusion, 2-50 mcg/min, Intravenous, Continuous, Lonia Farber, MD, 5 mcg/min at 03/26/12 2100;  DISCONTD: oseltamivir (TAMIFLU) capsule 75 mg, 75 mg, Oral, Daily, Vilinda Blanks Minor, NP, 75 mg at 03/26/12 1749 DISCONTD: phenylephrine (NEO-SYNEPHRINE) 10,000 mcg in dextrose 5 % 250 mL infusion, 30-200 mcg/min, Intravenous, Continuous, Oretha Milch, MD, Last Rate: 45 mL/hr at 03/26/12 2332, 30 mcg/min at 03/26/12 2332  Allergies: Review of patient's allergies indicates no known allergies. Past Medical History  Diagnosis Date  . Diabetes mellitus without complication   . Hypertension    History reviewed. No pertinent past surgical history. History reviewed. No pertinent family history. History   Social History  . Marital Status: Single    Spouse Name: N/A    Number of Children: N/A  . Years of Education: N/A   Occupational History  . Not on file.   Social History Main Topics  . Smoking status: Current Every Day Smoker -- 3.0 packs/day  . Smokeless tobacco: Not on file  . Alcohol Use: No  . Drug Use: No  . Sexually Active:    Other Topics Concern  . Not on file   Social History Narrative  . No narrative on file    Review of Systems: Review of  systems not obtained due to patient factors. Pt intubated and sedated.  Physical Exam: Blood pressure 94/67, pulse 124, temperature 98.5 F (36.9 C), temperature source Core (Comment), resp. rate 16, height 6\' 1"  (1.854 m), weight 122.5 kg (270 lb 1 oz), SpO2 97.00%. BP 94/67  Pulse 124  Temp 98.5 F (36.9 C) (Core (Comment))  Resp 16  Ht 6\' 1"  (1.854 m)  Wt 122.5 kg (270 lb 1 oz)  BMI 35.63 kg/m2  SpO2 97% General appearance: Pt intubated and sedated.  Head: Normocephalic, without obvious abnormality, atraumatic Eyes: Conjunctiva clear, PERRL Nose:  NG tube in place Throat: ET tube in place Neck: no adenopathy, no carotid bruit, no JVD, supple, symmetrical, trachea midline, thyroid not enlarged, symmetric, no tenderness/mass/nodules and Right HD catheter in place Lungs: rhonchi bilaterally Heart: irregularly irregular rhythm, S1, S2 normal and no S3 or S4 Abdomen: soft, non-distended Extremities: red, cold to touch Pulses: thready 1+ BLE Skin: Dry and clammy Neurologic: Mental status: unable to examine  Lab results: Basic Metabolic Panel:  Lab 03/27/12 4098 03/26/12 0442 03/25/12 1647  NA 132* 133* 133*  K 5.2* 3.7 4.2  CL 95* 95* 88*  CO2 20 22 20   GLUCOSE 230* 159* 132*  BUN 77* 66* 65*  CREATININE 5.14* 4.05* 5.70*  CALCIUM 6.7* 6.9* 8.1*  ALB -- -- --  PHOS 8.2* 6.1* --   Liver Function Tests:  Lab 03/25/12 1647  AST 40*  ALT 33  ALKPHOS 68  BILITOT 0.5  PROT 6.8  ALBUMIN 2.8*    CBC:  Lab 03/27/12 0500 03/26/12 0442 03/25/12 1647  WBC 7.8 7.2 8.3  NEUTROABS -- -- 7.5  HGB 10.9* 11.4* 12.9*  HCT 33.6* 33.6* 37.8*  MCV 91.8 88.4 89.6  PLT 135* 169 196   Blood Culture    Component Value Date/Time   SDES TRACHEAL ASPIRATE 03/25/2012 2115   SPECREQUEST NONE 03/25/2012 2115   CULT FEW CANDIDA ALBICANS 03/25/2012 2115   REPTSTATUS PENDING 03/25/2012 2115    Cardiac Enzymes:  Lab 03/26/12 0737 03/26/12 0137 03/25/12 2001  CKTOTAL -- -- --  CKMB -- -- --  CKMBINDEX -- -- --  TROPONINI 1.94* 1.98* 0.61*   CBG:  Lab 03/27/12 0741 03/27/12 0413 03/27/12 0025 03/26/12 1936 03/26/12 1527  GLUCAP 196* 185* 172* 116* 143*   Iron Studies: No results found for this basename: IRON,TIBC,TRANSFERRIN,FERRITIN in the last 72 hours  Micro Results: Recent Results (from the past 240 hour(s))  URINE CULTURE     Status: Normal   Collection Time   03/25/12  4:50 PM      Component Value Range Status Comment   Specimen Description URINE, CATHETERIZED   Final    Special Requests NONE   Final    Culture  Setup Time  03/25/2012 17:58   Final    Colony Count NO GROWTH   Final    Culture NO GROWTH   Final    Report Status 03/26/2012 FINAL   Final   CULTURE, BLOOD (ROUTINE X 2)     Status: Normal (Preliminary result)   Collection Time   03/25/12  4:55 PM      Component Value Range Status Comment   Specimen Description BLOOD ARM RIGHT   Final    Special Requests     Final    Value: BOTTLES DRAWN AEROBIC AND ANAEROBIC BLUE 10CC RED 5CC   Culture  Setup Time 03/26/2012 01:32   Final    Culture     Final  Value: STREPTOCOCCUS SPECIES     Note: Gram Stain Report Called to,Read Back By and Verified With: KELSEY WHITE @ 1519 03/26/12 BY KRAWS   Report Status PENDING   Incomplete   CULTURE, BLOOD (ROUTINE X 2)     Status: Normal (Preliminary result)   Collection Time   03/25/12  5:15 PM      Component Value Range Status Comment   Specimen Description BLOOD HAND RIGHT   Final    Special Requests BOTTLES DRAWN AEROBIC ONLY 4CC   Final    Culture  Setup Time 03/26/2012 01:32   Final    Culture     Final    Value: STREPTOCOCCUS SPECIES     Note: Gram Stain Report Called to,Read Back By and Verified With: [facility] WHITE @ 1519 03/26/12 BY KRAWS   Report Status PENDING   Incomplete   MRSA PCR SCREENING     Status: Normal   Collection Time   03/25/12  8:58 PM      Component Value Range Status Comment   MRSA by PCR NEGATIVE  NEGATIVE Final   CULTURE, RESPIRATORY     Status: Normal (Preliminary result)   Collection Time   03/25/12  9:15 PM      Component Value Range Status Comment   Specimen Description TRACHEAL ASPIRATE   Final    Special Requests NONE   Final    Gram Stain     Final    Value: MODERATE WBC PRESENT, PREDOMINANTLY PMN     NO SQUAMOUS EPITHELIAL CELLS SEEN     FEW GRAM POSITIVE RODS     FEW GRAM POSITIVE COCCI IN PAIRS     IN CHAINS RARE GRAM NEGATIVE RODS   Culture FEW CANDIDA ALBICANS   Final    Report Status PENDING   Incomplete    Studies/Results: Dg Chest Port 1 View  03/27/2012   *RADIOLOGY REPORT*  Clinical Data: Pneumonia, intubated  PORTABLE CHEST - 1 VIEW  Comparison: Prior chest x-ray yesterday, 03/26/2012  Findings: The patient is rotated to the left.  Unchanged position of endotracheal tube, left IJ central venous catheter and nasogastric tube.  Of note, the tip of the left IJ central venous catheter projects over the distal left brachiocephalic vein.  Unchanged cardiomegaly. Slightly improved aeration with a mild decrease in diffuse bilateral interstitial and airspace opacities. There is space disease remains most confluent in the left base. Probable left pleural effusion.  IMPRESSION:  1.  Slightly improved aeration consistent with improving interstitial edema. 2.  Background of diffuse bilateral interstitial and airspace opacities most confluent in the left base likely represents a combination of multi focal pneumonia with superimposed but improving edema. 3.  Probable left basilar pleural effusion 4.  Support apparatus as above.  Please note the tip of the left IJ approach central venous catheter is located in the left brachiocephalic vein.   Original Report Authenticated By: Sterling Big, M.D.    Dg Chest Port 1 View  03/26/2012  *RADIOLOGY REPORT*  Clinical Data: Assess endotracheal tube.  PORTABLE CHEST - 1 VIEW  Comparison: 03/25/2012.  Findings: Endotracheal tube is in satisfactory position. Nasogastric tube is followed into the stomach.  Left IJ central line tip projects over the brachiocephalic vein junction.  Heart is enlarged, stable.  Severe diffuse bilateral air space disease is unchanged.  Probable left pleural effusion.  IMPRESSION:  1. Severe diffuse bilateral air space disease is unchanged and may be due to edema.  Superimposed pneumonia at  the left lung base is also considered. 2.  Possible left pleural effusion.   Original Report Authenticated By: Reyes Ivan, M.D.    Dg Chest Portable 1 View  03/25/2012  *RADIOLOGY REPORT*  Clinical Data:  Central line placement.  PORTABLE CHEST - 1 VIEW  Comparison: 03/25/2012 at to 5:18 p.m.  Findings: Left IJ line tip projects over the upper SVC.  No pneumothorax.  ET tube tip 3.9 cm above the carina.  Cardiomegaly is present with bilateral airspace opacities which appear worsened.  Nasogastric tube enters the stomach.  Low lung volumes noted.  IMPRESSION: 1.  Left IJ line tip:  Upper SVC.  2.  Worsened bilateral airspace opacities.  3.  Cardiomegaly.   Original Report Authenticated By: Dellia Cloud, M.D.    Dg Chest Portable 1 View  03/25/2012  *RADIOLOGY REPORT*  Clinical Data: Respiratory distress.  PORTABLE CHEST - 1 VIEW  Comparison: No priors.  Findings: Single portable view of the chest demonstrates extensive interstitial and patchy multifocal airspace disease throughout the lungs bilaterally, most confluent in the left mid and lower lung, concerning for multilobar pneumonia.  There is also likely a small left pleural effusion.  Assessment of pulmonary vasculature is limited by the background of multifocal interstitial and airspace disease.  A component of underlying pulmonary edema is possible. Cardiac silhouette is partially obscured, but heart size appears borderline enlarged.  IMPRESSION: 1.  Extensive patchy multifocal asymmetrically distributed interstitial and airspace disease, as above, concerning for multilobar pneumonia. 2.  Superimposed pulmonary edema is possible, and if present, would be only mild. 3.  Possible small left pleural effusion.   Original Report Authenticated By: [name], M.D.     Assessment & Plan by Problem: Mr. Snee is 51 yo male with a PMH of DM w/complications, HTN, depression, current 2 pack a day smoker, and obesity.  1. AKI- from septic shock, pts baseline Cr unknown, likely elevated given DM and HTN  - HD today  - continue to monitor urine output  2. Sepsis- continue per primary team   This is a Psychologist, occupational Note.  The care of the  patient was discussed with Dr. Hyman Hopes and the assessment and plan was formulated with their assistance.  Please see their note for official documentation of the patient encounter.   Signed: V. [place], [place], PA-S2 03/27/2012, 12:46 PM  I have seen and examined this patient and agree with the plan of care , acute renal failure . Septic oliguric renal failure. Now hyperkalemic and acidotic. Will need CVVHDF. No heparin and will remove volume.  Tawanna Funk W 03/27/2012, 1:38 PM

## 2012-03-27 NOTE — Progress Notes (Signed)
  Amiodarone Drug - Drug Interaction Consult Note  Recommendations: 1. Monitor QTc interval while on Amiodarone + Levaquin. 2. Monitor for oversedation while on Amiodarone + Fentanyl.   Amiodarone is metabolized by the cytochrome P450 system and therefore has the potential to cause many drug interactions. Amiodarone has an average plasma half-life of 50 days (range 20 to 100 days).   There is potential for drug interactions to occur several weeks or months after stopping treatment and the onset of drug interactions may be slow after initiating amiodarone.   []  Statins: Increased risk of myopathy. Simvastatin- restrict dose to 20mg  daily. Other statins: counsel patients to report any muscle pain or weakness immediately.  []  Anticoagulants: Amiodarone can increase anticoagulant effect. Consider warfarin dose reduction. Patients should be monitored closely and the dose of anticoagulant altered accordingly, remembering that amiodarone levels take several weeks to stabilize.  []  Antiepileptics: Amiodarone can increase plasma concentration of phenytoin, phenytoin dose should be reduced. Note that small changes in phenytoin dose can result in large changes in phenytoin levels. Monitor patient closely and counsel on signs of toxicity.  []  Beta blockers: increased risk of bradycardia, AV block and myocardial depression. Sotalol - avoid concomitant use.  []   Calcium channel blockers (diltiazem and verapamil): increased risk of bradycardia, AV block and myocardial depression.  []   Cyclosporine: Amiodarone increases levels of cyclosporine. Reduced dose of cyclosporine is recommended.  []  Digoxin dose should be halved when amiodarone is started.  []  Diuretics: increased risk of cardiotoxicity if hypokalemia occurs.  []  Oral hypoglycemic agents (glyburide, glipizide, glimepiride): increased risk of hypoglycemia. Patient's glucose levels should be monitored closely when initiating amiodarone therapy.    [x]  Drugs that prolong the QT interval: Concurrent therapy is contraindicated due to the increased risk of torsades de pointes; . Antibiotics: e.g. fluoroquinolones, erythromycin. . Antiarrhythmics: e.g. quinidine, procainamide, disopyramide, sotalol. . Antipsychotics: e.g. phenothiazines, haloperidol.  . Lithium, tricyclic antidepressants, and methadone.  Amiodarone may increase levels/effects of fentanyl - recommend monitoring for oversedation.   Thank You,  Fayne Norrie  03/27/2012 3:46 PM

## 2012-03-27 NOTE — Procedures (Signed)
Hemodialysis Catheter Insertion Procedure Note Gregory [name] [id number] 22-Dec-1960  Procedure: Insertion of Central Venous Catheter Indications: CVVH  Procedure Details Consent: Risks of procedure as well as the alternatives and risks of each were explained to the (patient/caregiver).  Consent for procedure obtained. Time Out: Verified patient identification, verified procedure, site/side was marked, verified correct patient position, special equipment/implants available, medications/allergies/relevent history reviewed, required imaging and test results available.  Performed  Maximum sterile technique was used including antiseptics, cap, gloves, gown, hand hygiene, mask and sheet. Skin prep: Chlorhexidine; local anesthetic administered A antimicrobial bonded/coated triple lumen catheter was placed in the right internal jugular vein using the Seldinger technique.  Evaluation Blood flow good Complications: No apparent complications Patient did tolerate procedure well. Chest X-ray ordered to verify placement.  CXR: pending.  Performed under direct MD supervision using ultrasound guidance.    Danford Bad, NP 03/27/2012, 12:35 PM   Levy Pupa, MD, PhD 03/27/2012, 12:41 PM [facility] [phone] or if no answer [phone]

## 2012-03-27 NOTE — Significant Event (Signed)
Pt has recurrent hypotension off pressors, and phenylephrine restarted.  Will add vasopressin in setting of septic shock.  Will also give fluid bolus 500 ml NS.  Has persistent A fib with RVR.  Will start amiodarone infusion per protocol.  Will f/u ABG after vent changes for respiratory acidosis.  Coralyn Helling, MD 03/27/2012, 12:16 AM [phone]

## 2012-03-27 NOTE — Progress Notes (Signed)
Name: Gregory [name] MRN: [id number] DOB: 1960/06/25    LOS: 2  Referring Provider:  EDP Reason for Referral:  Acute respiratory failure  PULMONARY / CRITICAL CARE MEDICINE  Brief patient description:  51 yo, hx obesity, presumed OHS, COPD, DM, brought to [facility] ED on 10/8 with hypotension and respiratory distress and intubated for acute respiratory failure and diffuse B infiltrates.   Events Since Admission: 10/8  Brought to [facility] ED on 10/8 in respiratory distress and intubated for B infiltrates, shock 10/9 placed on ARDS 10/9 Positive blood cultures reported  Current Status: Critical  Vital Signs: Temp:  [99.1 F (37.3 C)-101.4 F (38.6 C)] 100 F (37.8 C) (10/10 0600) Pulse Rate:  [93-150] 130  (10/10 0400) Resp:  [6-33] 25  (10/10 0600) BP: (66-121)/(37-68) 72/56 mmHg (10/10 0600) SpO2:  [90 %-100 %] 97 % (10/10 0400) FiO2 (%):  [50 %-80 %] 50 % (10/10 0358) Weight:  [270 lb 1 oz (122.5 kg)] 270 lb 1 oz (122.5 kg) (10/10 0500)  Intake/Output Summary (Last 24 hours) at 03/27/12 0849 Last data filed at 03/27/12 0700  Gross per 24 hour  Intake 4221.6 ml  Output    550 ml  Net 3671.6 ml   Physical Examination: General:  Mechanically ventilated, synchronous Neuro:  Sedated, nonfocal.  HEENT:  PERRL, ETT in place Neck:  No JVD Cardiovascular:  Irregular rhythm, tachycardic Lungs:  Bilateral air entry, scattered rhonchi, diminished bases Abdomen:  Soft, nontender, distended vs.body habitus, bowel sounds present Musculoskeletal:  Atraumatic. Hands, feet cool to touch Skin:  No rash. Dry skin on feet  Active Problems:  Pneumonia  Acute respiratory failure  Sepsis  Diabetes  Hyperglycemia  Acute encephalopathy  Acute renal failure  ASSESSMENT AND PLAN  PULMONARY  Lab 03/27/12 0114 03/26/12 2334 03/26/12 2109 03/26/12 0600 03/26/12 0429  PHART 7.161* 7.164* 7.125* 7.247* 7.244*  PCO2ART 59.4* 64.0* 83.7* 53.3* 57.8*  PO2ART 80.0 67.0* 71.0* 100.0 62.0*  HCO3 21.0  22.8 27.2* 21.9 24.5*  O2SAT 91.0 85.0 85.0 95.1 83.0   Ventilator Settings: Vent Mode:  [-] PRVC FiO2 (%):  [50 %-80 %] 50 % Set Rate:  [18 bmp-30 bmp] 30 bmp Vt Set:  [470 mL-600 mL] 470 mL PEEP:  [8 cmH20-10 cmH20] 10 cmH20 Plateau Pressure:  [18 cmH20-31 cmH20] 22 cmH20  CXR:  10/10> slightly improved aeration. Diffuse airspace disease, ?multifocal pna?  ETT:  10/8 >>>  A:  Acute respiratory failure.  Pneumonia (CAP vs. Viral) +/- ARDS.  Less likely pulmonary edema.  Suspected COPD (without evidence of exacerbation) - smokes 2-3 packs of cigarettes per day. P:   - Goal SpO2 > 92, pH . 7.30. Current pH 7.1 >> repeat now - Wean fiO2 and PEEP as able. Per ARDS protocol 10/9 - Daily SBT when able to tolerate - Combivent scheduled - Abx as below - Diuresis when BP able to tolerate, based on CVP goals  CARDIOVASCULAR  Lab 03/26/12 0737 03/26/12 0736 03/26/12 0137 03/26/12 0136 03/25/12 2007 03/25/12 2001 03/25/12 1648 03/25/12 1647  TROPONINI 1.94* -- 1.98* -- -- 0.61* -- --  LATICACIDVEN -- 1.2 -- 1.7 4.6* -- -- 5.7*  PROBNP -- -- -- -- -- -- 7610.0* --   ECG:  10/10 Afib on monitor, rate 120's Lines: L IJ TLC 10/8 >>> TTE: Mild LVH. Systolic function was normal. The estimated ejection fraction was in the range of 50% to 55%.  A: Septic Shock.  Less likely acute systolic congestive heart failure with cardiogenic shock.  NSTEMI likely secondary to shock, hypoxemia.  Afib 10/9 >> started on amio P:  - Goal MAP > 60 - Troponin plateau. Will check one additional today - Continue Neo and vasopressin - Amiodarone for Afib - Cortisol level 11 >> hydrocort initiated 10/9  RENAL  Lab 03/27/12 0500 03/26/12 0442 03/25/12 1647  NA 132* 133* 133*  K 5.2* 3.7 --  CL 95* 95* 88*  CO2 20 22 20   BUN 77* 66* 65*  CREATININE 5.14* 4.05* 5.70*  CALCIUM 6.7* 6.9* 8.1*  MG 1.7 1.4* --  PHOS 8.2* 6.1* --   Intake/Output      10/09 0701 - 10/10 0700 10/10 0701 - 10/11 0700   I.V.  (mL/kg) 2514.4 (20.5)    NG/GT 250    IV Piggyback 1600    Total Intake(mL/kg) 4364.4 (35.6)    Urine (mL/kg/hr) 750 (0.3)    Total Output 750    Net +3614.4          Foley:  10/8 >>>  A:  Dehydration.  ARF likely ATN Hypomagnesemia Hypophosphatemia>>hyperphosphatemia Hyperkalemia  P:   - Goal CVP 10 to12 - NS boluses PRN to goal CVP >> currently at goal - IVF NS 100 mL/h - Trend BMet - Hyperphos, hypocalcemia, hyperkalemia - K+ elevated to 5.2. Will give calcium gluconate - call renal 10/10 and plan HD catheter for CVVHD  GASTROINTESTINAL  Lab 03/25/12 1647  AST 40*  ALT 33  ALKPHOS 68  BILITOT 0.5  PROT 6.8  ALBUMIN 2.8*   A:  Suspected malnutrition. Mild AST elevation P:   - Follow LFT with CMet in PM, as well as AM - TF started 10/9  HEMATOLOGIC  Lab 03/27/12 0500 03/26/12 0442 03/25/12 1647  HGB 10.9* 11.4* 12.9*  HCT 33.6* 33.6* 37.8*  PLT 135* 169 196  INR -- -- --  APTT -- -- --   A:  No active issues. P:  - Trend CBC - Will check INR now  INFECTIOUS  Lab 03/27/12 0500 03/26/12 0442 03/25/12 1647  WBC 7.8 7.2 8.3  PROCALCITON 40.71 39.19 80.11   Cultures: 10/8  Blood >>> GPC in pairs >>  10/8  Respiratory >>> pending 10/9 nasal influ>> neg  Antibiotics: Azithromycin 10/8 x 1 Ceftriaxone 10/8 x 1 Zosyn 10/8 >>> Vancomycin 10/8 >>> Levaquin 10/8 >>> Tamiflu 10/9>>10/10  A:  Pneumonia (CAP vs aspiration). Suspect pneumococcal PNA/sepsis, cx pending P:   - Antibiotics / cultures as above - Trend PCT> stable - Consider viral process, viral cultures. Flu negative.  - Given positive blood cultures, most likely pneumococcal   ENDOCRINE  Lab 03/27/12 0741 03/27/12 0413 03/27/12 0025 03/26/12 1936 03/26/12 1527  GLUCAP 196* 185* 172* 116* 143*   A:  DM. P:   - Phase 1 ICU glycemic control protocol - Hold Metformin  NEUROLOGIC  A:  Acute encephalopathy.  History of diabetic neuropathy. P:   - Goal RASS 0 to -1 - Daily WUA -  Fentanyl / Versed gtt - Hold home Oxycodone, Paxil, Phentermine, Lyrica, Trazodone  BEST PRACTICE / DISPOSITION Level of Care:  ICU Primary Service:  PCCM Consultants:  None Code Status:  Full Diet:  NPO DVT Px:  Protonix GI Px:  Heparin Skin Integrity:  Intact Social / Family:  Family is not Loss adjuster, chartered M. Hairford, M.D. 03/27/2012 8:49 AM   CC time 45 minutes  Levy Pupa, MD, PhD 03/27/2012, 9:50 AM [facility] [phone] or if no answer [phone]

## 2012-03-27 NOTE — Progress Notes (Signed)
Md called d/t issues with patients dialysis cathter. Dr Marin Shutter came to assess. Catheter at this time flushes and return flow from access port is adequate. Will resume CVVHD .

## 2012-03-27 NOTE — Progress Notes (Signed)
Dr. Vassie Loll (CCM) and Dr. Hyman Hopes (Renal) notified of unsuccessful attempt to initiate CRRT.  Unable to get catheter to run---unable to get consistent blood return from access port, machine will not run due to exceedingly high pressures.

## 2012-03-28 ENCOUNTER — Inpatient Hospital Stay (HOSPITAL_COMMUNITY): Payer: Self-pay

## 2012-03-28 DIAGNOSIS — R6521 Severe sepsis with septic shock: Secondary | ICD-10-CM

## 2012-03-28 LAB — GLUCOSE, CAPILLARY
Glucose-Capillary: 92 mg/dL (ref 70–99)
Glucose-Capillary: 102 mg/dL — ABNORMAL HIGH (ref 70–99)
Glucose-Capillary: 151 mg/dL — ABNORMAL HIGH (ref 70–99)

## 2012-03-28 LAB — CULTURE, BLOOD (ROUTINE X 2)

## 2012-03-28 LAB — RENAL FUNCTION PANEL
Albumin: 2.1 g/dL — ABNORMAL LOW (ref 3.5–5.2)
Calcium: 7.1 mg/dL — ABNORMAL LOW (ref 8.4–10.5)
GFR calc Af Amer: 16 mL/min — ABNORMAL LOW (ref >90)
GFR calc non Af Amer: 14 mL/min — ABNORMAL LOW (ref >90)
Phosphorus: 5.8 mg/dL — ABNORMAL HIGH (ref 2.3–4.6)
Potassium: 4.8 mEq/L (ref 3.5–5.1)
Sodium: 135 mEq/L (ref 135–145)

## 2012-03-28 LAB — COMPREHENSIVE METABOLIC PANEL
Albumin: 2.1 g/dL — ABNORMAL LOW (ref 3.5–5.2)
BUN: 79 mg/dL — ABNORMAL HIGH (ref 6–23)
Calcium: 6.9 mg/dL — ABNORMAL LOW (ref 8.4–10.5)
Creatinine, Ser: 5.65 mg/dL — ABNORMAL HIGH (ref 0.50–1.35)
GFR calc Af Amer: 12 mL/min — ABNORMAL LOW (ref >90)
Glucose, Bld: 141 mg/dL — ABNORMAL HIGH (ref 70–99)
Total Protein: 6 g/dL (ref 6.0–8.3)

## 2012-03-28 LAB — CBC
HCT: 30.7 % — ABNORMAL LOW (ref 39.0–52.0)
Hemoglobin: 10.3 g/dL — ABNORMAL LOW (ref 13.0–17.0)
RBC: 3.41 MIL/uL — ABNORMAL LOW (ref 4.22–5.81)
RDW: 15.1 % (ref 11.5–15.5)
WBC: 10.2 10*3/uL (ref 4.0–10.5)

## 2012-03-28 LAB — PROTIME-INR: INR: 1.14 (ref 0.00–1.49)

## 2012-03-28 LAB — MAGNESIUM: Magnesium: 2.1 mg/dL (ref 1.5–2.5)

## 2012-03-28 LAB — PHOSPHORUS: Phosphorus: 6.8 mg/dL — ABNORMAL HIGH (ref 2.3–4.6)

## 2012-03-28 MED ORDER — DEXTROSE 5 % IV SOLN: 2.0000 g | Freq: Two times a day (BID) | INTRAVENOUS | Status: DC

## 2012-03-28 MED ORDER — LEVOFLOXACIN IN D5W 250 MG/50ML IV SOLN
250.0000 mg | INTRAVENOUS | Status: DC
  Administered 2012-03-28 – 2012-04-01 (×5): 250 mg via INTRAVENOUS
  Filled 2012-03-28 (×6): qty 50

## 2012-03-28 NOTE — Progress Notes (Signed)
Name: Jaishawn Buffone MRN: [id number] DOB: 1960/12/21    LOS: 3  Referring Provider:  EDP Reason for Referral:  Acute respiratory failure  PULMONARY / CRITICAL CARE MEDICINE  Brief patient description:  51 yo, hx obesity, presumed OHS, COPD, DM, brought to [facility] ED on 10/8 with hypotension and respiratory distress and intubated for acute respiratory failure and diffuse B infiltrates.   Events Since Admission: 10/8  Brought to [facility] ED on 10/8 in respiratory distress and intubated for B infiltrates, shock 10/9 placed on ARDS 10/9 Positive blood cultures reported 10/10 HD cath placed, CVVH started  Current Status: Critical  Subjective:  CVVHD started sedated  Vital Signs: Temp:  [95.9 F (35.5 C)-99.3 F (37.4 C)] 95.9 F (35.5 C) (10/11 0500) Pulse Rate:  [41-132] 61  (10/11 0500) Resp:  [16-32] 29  (10/11 0323) BP: (81-117)/(45-77) 88/71 mmHg (10/11 0500) SpO2:  [79 %-100 %] 100 % (10/11 0500) Arterial Line BP: (82)/(57) 82/57 mmHg (10/10 1200) FiO2 (%):  [50 %] 50 % (10/11 0323) Weight:  [281 lb 12 oz (127.8 kg)] 281 lb 12 oz (127.8 kg) (10/11 0500)  Intake/Output Summary (Last 24 hours) at 03/28/12 0802 Last data filed at 03/28/12 0600  Gross per 24 hour  Intake 2859.74 ml  Output    901 ml  Net 1958.74 ml   Physical Examination: General:  Mechanically ventilated. Diaphoretic Neuro:  Sedated, nonfocal.  HEENT:  ETT in place Cardiovascular:  Irregular rhythm, regular rate Lungs:  Bilateral air entry, scattered rhonchi, diminished bases Abdomen:  Soft, nontender, distended vs.body habitus, bowel sounds hypoactive  Musculoskeletal:  Atraumatic. Hands, feet cool to touch Skin:  No rash. Dry skin on feet  Active Problems:  Pneumonia  Acute respiratory failure  Sepsis  Diabetes  Hyperglycemia  Acute encephalopathy  Acute renal failure  ASSESSMENT AND PLAN  PULMONARY  Lab 03/27/12 1001 03/27/12 0114 03/26/12 2334 03/26/12 2109 03/26/12 0600  PHART 7.201* 7.161*  7.164* 7.125* 7.247*  PCO2ART 52.5* 59.4* 64.0* 83.7* 53.3*  PO2ART 85.0 80.0 67.0* 71.0* 100.0  HCO3 20.5 21.0 22.8 27.2* 21.9  O2SAT 93.0 91.0 85.0 85.0 95.1   Ventilator Settings: Vent Mode:  [-] PRVC FiO2 (%):  [50 %] 50 % Set Rate:  [30 bmp] 30 bmp Vt Set:  [440 mL-470 mL] 470 mL PEEP:  [8 cmH20] 8 cmH20 Plateau Pressure:  [20 cmH20-25 cmH20] 23 cmH20  CXR:  10/11> slightly improved aeration. Diffuse airspace disease, ?L effusion  ETT:  10/8 >>>  A:  Acute respiratory failure.  Pneumonia (CAP vs. Viral) +/- ARDS.  Less likely pulmonary edema.  Suspected COPD (without evidence of exacerbation) - smokes 2-3 packs of cigarettes per day. P:   - Goal SpO2 > 92, pH . 7.20+. Repeat ABG shows pH of 7.17 - ARDS protocol 10/9 - Daily SBT when able to tolerate - Combivent scheduled - Abx as below - Diuresis when BP able to tolerate, based on CVP goals - bedside US to evaluate L effusion for sampling  CARDIOVASCULAR  Lab 03/27/12 1600 03/26/12 0737 03/26/12 0736 03/26/12 0137 03/26/12 0136 03/25/12 2007 03/25/12 2001 03/25/12 1648 03/25/12 1647  TROPONINI 0.77* 1.94* -- 1.98* -- -- 0.61* -- --  LATICACIDVEN -- -- 1.2 -- 1.7 4.6* -- -- 5.7*  PROBNP -- -- -- -- -- -- -- 7610.0* --   ECG:  10/11 Afib on monitor, rate controlled Lines: L IJ TLC 10/8 >>> R HD Cath 10/10>> TTE: Mild LVH. Systolic function was normal. The estimated ejection fraction  was in the range of 50% to 55%.  A: Septic Shock.  Less likely acute systolic congestive heart failure with cardiogenic shock.  NSTEMI likely secondary to shock, hypoxemia.  Afib 10/9 >> started on amio P:  - Goal MAP > 60. Currently 70. Wean pressors as tolerated - Troponin plateau and trending down - Continue Neo and vasopressin - Amiodarone for Afib - Cortisol level 11 >> hydrocort initiated 10/9  RENAL  Lab 03/28/12 0240 03/27/12 1600 03/27/12 0500 03/26/12 0442 03/25/12 1647  NA 136 135 132* 133* 133*  K 4.6 4.7 -- -- --  CL  99 97 95* 95* 88*  CO2 19 18* 20 22 20   BUN 79* 85* 77* 66* 65*  CREATININE 5.65* 6.24* 5.14* 4.05* 5.70*  CALCIUM 6.9* 6.7* 6.7* 6.9* 8.1*  MG 2.1 -- 1.7 1.4* --  PHOS 6.8* -- 8.2* 6.1* --   Intake/Output      10/10 0701 - 10/11 0700 10/11 0701 - 10/12 0700   I.V. (mL/kg) 2333 (18.3)    NG/GT 220    IV Piggyback 470    Total Intake(mL/kg) 3023 (23.7)    Urine (mL/kg/hr) 75 (0)    Other 826    Total Output 901    Net +2122         Stool Occurrence 1 x     Foley:  10/8 >>>  A:  Dehydration.  ARF likely ATN Hypomagnesemia Hypophosphatemia>>hyperphosphatemia Hyperkalemia- Resolved Started on CVVH 10/10  P:   - Continue CVVH per renal - Goal CVP 10 to12. Unable to get CVP this morning - Trend BMet  GASTROINTESTINAL  Lab 03/28/12 0240 03/27/12 1600 03/25/12 1647  AST 336* 255* 40*  ALT 139* 102* 33  ALKPHOS 99 112 68  BILITOT 0.3 0.2* 0.5  PROT 6.0 6.2 6.8  ALBUMIN 2.1* 2.1* 2.8*   A:  Suspected malnutrition. Mild AST elevation P:   - LFTs trending up, continue to monitor for shock liver - TF started 10/9  HEMATOLOGIC  Lab 03/28/12 0240 03/27/12 1000 03/27/12 0500 03/26/12 0442 03/25/12 1647  HGB 10.3* -- 10.9* 11.4* 12.9*  HCT 30.7* -- 33.6* 33.6* 37.8*  PLT 127* -- 135* 169 196  INR 1.14 1.16 -- -- --  APTT -- -- -- -- --   A:  No active issues. P:  - Trend CBC, HgB slowly trending down - INR remains within normal limits  INFECTIOUS  Lab 03/28/12 0240 03/27/12 0500 03/26/12 0442 03/25/12 1647  WBC 10.2 7.8 7.2 8.3  PROCALCITON -- 40.71 39.19 80.11   Cultures: 10/8 urine>>no growth 10/8  Blood >>> GPC in pairs >> Strep pneumo 10/8  Respiratory >>> Strep pneumo 10/9 nasal influ>> neg  Antibiotics: Azithromycin 10/8 x 1 Ceftriaxone 10/8 x 1 Zosyn 10/8 >>> 10/11 Vancomycin 10/8 >>> 10/11 Levaquin 10/8 (has best MIC vs pneumococcus at this hospital, higher than ceftriaxone) >>>  Tamiflu 10/9>>10/10  A:  Pneumonia (CAP vs aspiration),   pneumococcal PNA/sepsis P:   - Antibiotics / cultures as above - Trend PCT> stable  ENDOCRINE  Lab 03/28/12 0424 03/28/12 0022 03/27/12 1936 03/27/12 1536 03/27/12 1324  GLUCAP 143* 143* 186* 213* 207*   A:  DM. P:   - ICU glycemic control protocol - Hold Metformin  NEUROLOGIC  A:  Acute encephalopathy.  History of diabetic neuropathy. P:   - Goal RASS 0 to -1 - Daily WUA - Fentanyl / Versed gtt   BEST PRACTICE / DISPOSITION Level of Care:  ICU Primary Service:  PCCM Consultants:  None Code Status:  Full Diet:  NPO- trickle tube feeds DVT Px:  Protonix GI Px:  Heparin Skin Integrity:  Intact Social / Family:  Daughter at [employer] M. Hairford, M.D. 03/28/2012 8:02 AM   CC time 45 minutes  Levy Pupa, MD, PhD 03/28/2012, 11:28 AM [facility] [phone] or if no answer [phone]

## 2012-03-28 NOTE — Progress Notes (Signed)
CRRT restarted

## 2012-03-28 NOTE — Progress Notes (Signed)
Pt placed on Bair Hugger/ temp of 95.9

## 2012-03-28 NOTE — Progress Notes (Signed)
CVVHD management.  Metabolically stable on 4k dialysate and replacement.  On 2 pressors, BP slightly better.  Cont to keep even for til BP better.

## 2012-03-29 ENCOUNTER — Inpatient Hospital Stay (HOSPITAL_COMMUNITY): Payer: Self-pay

## 2012-03-29 LAB — BLOOD GAS, ARTERIAL
Acid-base deficit: 5.6 mmol/L — ABNORMAL HIGH (ref 0.0–2.0)
Drawn by: 31101
FIO2: 0.6 %
MECHVT: 470 mL
MECHVT: 550 mL
O2 Saturation: 98 %
O2 Saturation: 98.8 %
PEEP: 8 cmH2O
Patient temperature: 98.6
RATE: 30 resp/min
RATE: 30 resp/min
TCO2: 22.9 mmol/L (ref 0–100)
pCO2 arterial: 56.3 mmHg — ABNORMAL HIGH (ref 35.0–45.0)
pH, Arterial: 7.247 — ABNORMAL LOW (ref 7.350–7.450)
pO2, Arterial: 165 mmHg — ABNORMAL HIGH (ref 80.0–100.0)

## 2012-03-29 LAB — CULTURE, RESPIRATORY W GRAM STAIN

## 2012-03-29 LAB — COMPREHENSIVE METABOLIC PANEL
Albumin: 2 g/dL — ABNORMAL LOW (ref 3.5–5.2)
BUN: 59 mg/dL — ABNORMAL HIGH (ref 6–23)
Creatinine, Ser: 3.78 mg/dL — ABNORMAL HIGH (ref 0.50–1.35)
GFR calc Af Amer: 20 mL/min — ABNORMAL LOW (ref >90)
Glucose, Bld: 146 mg/dL — ABNORMAL HIGH (ref 70–99)
Total Protein: 5.8 g/dL — ABNORMAL LOW (ref 6.0–8.3)

## 2012-03-29 LAB — PROTIME-INR: INR: 1.08 (ref 0.00–1.49)

## 2012-03-29 LAB — RENAL FUNCTION PANEL
Albumin: 2.1 g/dL — ABNORMAL LOW (ref 3.5–5.2)
Calcium: 7.7 mg/dL — ABNORMAL LOW (ref 8.4–10.5)
GFR calc Af Amer: 22 mL/min — ABNORMAL LOW (ref >90)
GFR calc non Af Amer: 19 mL/min — ABNORMAL LOW (ref >90)
Glucose, Bld: 97 mg/dL (ref 70–99)
Phosphorus: 4.7 mg/dL — ABNORMAL HIGH (ref 2.3–4.6)
Potassium: 4.5 mEq/L (ref 3.5–5.1)
Sodium: 136 mEq/L (ref 135–145)

## 2012-03-29 LAB — GLUCOSE, CAPILLARY
Glucose-Capillary: 137 mg/dL — ABNORMAL HIGH (ref 70–99)
Glucose-Capillary: 90 mg/dL (ref 70–99)
Glucose-Capillary: 110 mg/dL — ABNORMAL HIGH (ref 70–99)

## 2012-03-29 LAB — CBC
HCT: 28.3 % — ABNORMAL LOW (ref 39.0–52.0)
MCV: 90.7 fL (ref 78.0–100.0)
Platelets: 104 10*3/uL — ABNORMAL LOW (ref 150–400)
RBC: 3.12 MIL/uL — ABNORMAL LOW (ref 4.22–5.81)
RDW: 15.4 % (ref 11.5–15.5)
WBC: 4.4 10*3/uL (ref 4.0–10.5)

## 2012-03-29 MED ORDER — AMIODARONE HCL 200 MG PO TABS
200.0000 mg | ORAL_TABLET | Freq: Two times a day (BID) | ORAL | Status: DC
  Administered 2012-03-29 – 2012-04-03 (×11): 200 mg
  Filled 2012-03-29 (×13): qty 1

## 2012-03-29 MED ORDER — VECURONIUM BROMIDE 10 MG IV SOLR
10.0000 mg | Freq: Once | INTRAVENOUS | Status: DC
  Filled 2012-03-29: qty 10

## 2012-03-29 MED ORDER — PANTOPRAZOLE SODIUM 40 MG PO PACK
40.0000 mg | PACK | Freq: Every day | ORAL | Status: DC
  Administered 2012-03-29 – 2012-04-06 (×9): 40 mg
  Filled 2012-03-29 (×13): qty 20

## 2012-03-29 NOTE — Progress Notes (Signed)
CVVHD management.  Metabolically stable.  Resp acidosis.  If the goal is permissive hypercapnia then can and IV bicarb in place of one replacement fluids.  Still requiring pressors but dose less. Will try to pull 50cc/hr as tolerated.

## 2012-03-29 NOTE — Progress Notes (Signed)
eLink Physician-Brief Progress Note Patient Name: Gregory [name] DOB: [birth year] MRN: [id number]  Date of Service  03/29/2012   HPI/Events of Note   Tracheal aspirate pos for Strep Pneumo sens to Levofloxacin  eICU Interventions  Continue Levo       Jerri Glauser 03/29/2012, 4:34 PM

## 2012-03-29 NOTE — Progress Notes (Signed)
Renal on call Physician paged/called x 2

## 2012-03-29 NOTE — Progress Notes (Signed)
RT supervisor and RT discovered pt. Gregory [name] to be extremely positional with no blood drawback. MD was informed and stated ok to pull the Gregory [name].MD asked RT to attempt to save the line if possible. RT attempted to save the line for a brief period but eventually the line became dis functional  again. RT supervisor pull the Gregory [name] from pt. arm and MD was notified.

## 2012-03-29 NOTE — Progress Notes (Signed)
Name: Gregory [name] MRN: [id number] DOB: 1961/05/06    LOS: 4  Referring Provider:  EDP Reason for Referral:  Acute respiratory failure  PULMONARY / CRITICAL CARE MEDICINE  Brief patient description:  51 yo, hx obesity, presumed OHS, COPD, DM, brought to [facility] ED on 10/8 with hypotension and respiratory distress and intubated for acute respiratory failure and diffuse B infiltrates.   Events Since Admission: 10/8  Brought to [facility] ED on 10/8 in respiratory distress and intubated for B infiltrates, shock 10/9 placed on ARDS 10/9 Positive blood cultures reported 10/10 HD cath placed, CVVH started  Current Status: Critical  Subjective:  CVVHD continues. Slowly weaning pressors Vt was increased to 550 at some time last night  Vital Signs: Temp:  [95.8 F (35.4 C)-97.6 F (36.4 C)] 97.6 F (36.4 C) (10/12 0600) Pulse Rate:  [52-97] 63  (10/12 0600) Resp:  [0-40] 0  (10/12 0600) BP: (93-113)/(49-61) 100/50 mmHg (10/12 0328) SpO2:  [84 %-100 %] 100 % (10/12 0600) FiO2 (%):  [40 %-60 %] 60 % (10/12 0334) Weight:  [281 lb 4.9 oz (127.6 kg)] 281 lb 4.9 oz (127.6 kg) (10/12 0400)  Intake/Output Summary (Last 24 hours) at 03/29/12 0649 Last data filed at 03/29/12 0600  Gross per 24 hour  Intake 2361.1 ml  Output   2578 ml  Net -216.9 ml   Physical Examination: General:  Mechanically ventilated Neuro:  Sedated, nonfocal.  HEENT:  ETT in place Cardiovascular: RRR Lungs:  Bilateral air entry, diminished bases Abdomen:  Soft, nontender,  bowel sounds hypoactive  Musculoskeletal:  Atraumatic Skin:  No rash. Dry skin on feet  Active Problems:  Pneumococcal pneumonia  Acute respiratory failure  Septic shock(785.52)  Diabetes  Hyperglycemia  Acute encephalopathy  Acute renal failure  ASSESSMENT AND PLAN  PULMONARY  Lab 03/29/12 0200 03/29/12 0022 03/27/12 1001 03/27/12 0114 03/26/12 2334  PHART 7.247* 7.201* 7.201* 7.161* 7.164*  PCO2ART 50.9* 56.3* 52.5* 59.4* 64.0*  PO2ART  96.7 165.0* 85.0 80.0 67.0*  HCO3 21.4 21.2 20.5 21.0 22.8  O2SAT 98.0 98.8 93.0 91.0 85.0   Ventilator Settings: Vent Mode:  [-] PRVC FiO2 (%):  [40 %-60 %] 60 % Set Rate:  [30 bmp] 30 bmp Vt Set:  [470 mL-550 mL] 550 mL PEEP:  [8 cmH20] 8 cmH20 Plateau Pressure:  [17 cmH20-27 cmH20] 27 cmH20  CXR:  10/12> Mostly unchanged diffuse airspace disease, L effusion  ETT:  10/8 >>> Bedside u/s for eval of L pleural effusion: 10/11>> Only small amount of fluid. Not enough to sample  A:  Acute respiratory failure.  Pneumonia (CAP vs. Viral) +/- ARDS.  Less likely pulmonary edema.  Suspected COPD (without evidence of exacerbation) - smokes 2-3 packs of cigarettes per day. P:   - Goal SpO2 > 92, pH . 7.20+. Repeat ABG shows pH of 7.2 - ARDS protocol 10/9 >> change Vt back to 6cc/kg, tolerate pH ~ 7.2. If unable to maintain w permissive hypercapnia then will ask Dr Briant Cedar to adjust bicarb in CVVHD - Daily SBT when able to tolerate - Con't combivent scheduled. Per report, patient has increased agitation and desats with treatment - Abx as below (changed 03/28/12) - Diuresis when BP able to tolerate, based on CVP goals for ARDS  CARDIOVASCULAR  Lab 03/27/12 1600 03/26/12 0737 03/26/12 0736 03/26/12 0137 03/26/12 0136 03/25/12 2007 03/25/12 2001 03/25/12 1648 03/25/12 1647  TROPONINI 0.77* 1.94* -- 1.98* -- -- 0.61* -- --  LATICACIDVEN -- -- 1.2 -- 1.7 4.6* -- --  5.7*  PROBNP -- -- -- -- -- -- -- 7610.0* --   ECG:  10/11 NSR on monitor, rate controlled in 60's Lines: L IJ TLC 10/8 >>> R HD Cath 10/10>> TTE: Mild LVH. Systolic function was normal. The estimated ejection fraction was in the range of 50% to 55%.  A: Septic Shock.  Less likely acute systolic congestive heart failure with cardiogenic shock.  NSTEMI likely secondary to shock, hypoxemia.  Afib 10/9 >> started on amio P:  - Goal MAP > 60 - Con't wean pressors as tolerated - Troponin plateau and trending down - Continue Neo  and vasopressin; wean - Amiodarone for Afib- will change to PO today - Cortisol level 11 >> hydrocort initiated 10/9  RENAL  Lab 03/29/12 0308 03/28/12 1600 03/28/12 0240 03/27/12 1600 03/27/12 0500 03/26/12 0442  NA 136 135 136 135 132* --  K 4.7 4.8 -- -- -- --  CL 100 98 99 97 95* --  CO2 21 20 19  18* 20 --  BUN 59* 68* 79* 85* 77* --  CREATININE 3.78* 4.53* 5.65* 6.24* 5.14* --  CALCIUM 7.4* 7.1* 6.9* 6.7* 6.7* --  MG -- -- 2.1 -- 1.7 1.4*  PHOS -- 5.8* 6.8* -- 8.2* 6.1*   Intake/Output      10/11 0701 - 10/12 0700   I.V. (mL/kg) 1820.1 (14.3)   NG/GT 280   IV Piggyback 125   Total Intake(mL/kg) 2225.1 (17.4)   Urine (mL/kg/hr) 60 (0)   Other 2376   Total Output 2436   Net -210.9       Stool Occurrence 1 x    Foley:  10/8 >>>  A:  Dehydration.  ARF likely ATN Hypomagnesemia Hypophosphatemia>>hyperphosphatemia Hyperkalemia- Resolved Started on CVVH 10/10  P:   - Continue CVVH per renal.  - No changes in fluids for now. Will attempt to pull small volumes of fluid off, if BP tolerates - Goal CVP 10 to12. Currently 14 - Trend BMet  GASTROINTESTINAL  Lab 03/29/12 0308 03/28/12 1600 03/28/12 0240 03/27/12 1600 03/25/12 1647  AST 125* -- 336* 255* 40*  ALT 112* -- 139* 102* 33  ALKPHOS 95 -- 99 112 68  BILITOT 0.3 -- 0.3 0.2* 0.5  PROT 5.8* -- 6.0 6.2 6.8  ALBUMIN 2.0* 2.1* 2.1* 2.1* 2.8*   A:  Suspected malnutrition. Mild AST elevation P:   - LFTs have plateaued - Trickle TF started 10/9  HEMATOLOGIC  Lab 03/29/12 0308 03/28/12 0240 03/27/12 1000 03/27/12 0500 03/26/12 0442 03/25/12 1647  HGB 9.2* 10.3* -- 10.9* 11.4* 12.9*  HCT 28.3* 30.7* -- 33.6* 33.6* 37.8*  PLT 104* 127* -- 135* 169 196  INR 1.08 1.14 1.16 -- -- --  APTT -- -- -- -- -- --   A:  No active issues. P:  - Trend CBC  - HgB continues to trending down. Transfusion threshold <7.0 - Check FOBT. Consider anemia workup since he has renal failure - INR remains within normal  limits  INFECTIOUS  Lab 03/29/12 0308 03/28/12 0240 03/27/12 0500 03/26/12 0442 03/25/12 1647  WBC 4.4 10.2 7.8 7.2 8.3  PROCALCITON -- -- 40.71 39.19 80.11   Cultures: 10/8 urine>>no growth 10/8  Blood >>> GPC in pairs >> Strep pneumo 10/8  Respiratory >>> Strep pneumo 10/9 nasal influ>> neg  Antibiotics: Azithromycin 10/8 x 1 Ceftriaxone 10/8 x 1 Zosyn 10/8 >>> 10/11 Vancomycin 10/8 >>> 10/11 Levaquin 10/8 (has best MIC vs pneumococcus at this hospital, higher than ceftriaxone) >>>  Tamiflu 10/9>>10/10  A:  Pneumonia (CAP vs aspiration),  pneumococcal PNA/sepsis P:   - Antibiotics / cultures as above - Trend PCT> stable  ENDOCRINE  Lab 03/29/12 0425 03/29/12 0021 03/28/12 1605 03/28/12 1235 03/28/12 0834  GLUCAP 137* 114* 151* 102* 92   A:  DM. P:   - ICU glycemic control protocol - Hold Metformin  NEUROLOGIC  A:  Acute encephalopathy.  History of diabetic neuropathy. P:   - Goal RASS 0 to -1 - Daily WUA - Fentanyl / Versed gtt   BEST PRACTICE / DISPOSITION Level of Care:  ICU Primary Service:  PCCM Consultants:  None Code Status:  Full Diet:  NPO- trickle tube feeds DVT Px:  Protonix GI Px:  Heparin Skin Integrity:  Intact Social / Family:  Daughter at [employer] M. Hairford, M.D. 03/29/2012 6:49 AM   CC time 45 minutes  Levy Pupa, MD, PhD 03/29/2012, 10:19 AM [facility] [phone] or if no answer [phone]

## 2012-03-29 NOTE — Procedures (Signed)
Arterial Catheter Insertion Procedure Note Thales Treadwell [id number] Mar 30, 1961  Procedure: Insertion of Arterial Catheter  Indications: Blood pressure monitoring  Procedure Details Consent: Risks of procedure as well as the alternatives and risks of each were explained to the (patient/caregiver).  Consent for procedure obtained. Time Out: Verified patient identification, verified procedure, site/side was marked, verified correct patient position, special equipment/implants available, medications/allergies/relevent history reviewed, required imaging and test results available.  Performed  Maximum sterile technique was used including antiseptics, gloves, gown, hand hygiene, mask and sheet. Skin prep: Chlorhexidine; local anesthetic administered 20 gauge catheter was inserted into left radial artery using the Seldinger technique.  Evaluation Blood flow good; BP tracing good. Complications: No apparent complications. Positive allens test with arterial pressure of 145/61 with good wave form.  Leafy Half 03/29/2012

## 2012-03-30 ENCOUNTER — Inpatient Hospital Stay (HOSPITAL_COMMUNITY): Payer: Self-pay

## 2012-03-30 DIAGNOSIS — N179 Acute kidney failure, unspecified: Secondary | ICD-10-CM

## 2012-03-30 DIAGNOSIS — A419 Sepsis, unspecified organism: Secondary | ICD-10-CM

## 2012-03-30 DIAGNOSIS — J13 Pneumonia due to Streptococcus pneumoniae: Secondary | ICD-10-CM

## 2012-03-30 DIAGNOSIS — J9600 Acute respiratory failure, unspecified whether with hypoxia or hypercapnia: Secondary | ICD-10-CM

## 2012-03-30 DIAGNOSIS — R6521 Severe sepsis with septic shock: Secondary | ICD-10-CM

## 2012-03-30 DIAGNOSIS — E119 Type 2 diabetes mellitus without complications: Secondary | ICD-10-CM

## 2012-03-30 LAB — POCT I-STAT 3, ART BLOOD GAS (G3+)
O2 Saturation: 93 %
O2 Saturation: 94 %
O2 Saturation: 92 %
pCO2 arterial: 59.1 mmHg (ref 35.0–45.0)
pCO2 arterial: 48.6 mmHg — ABNORMAL HIGH (ref 35.0–45.0)
pCO2 arterial: 54 mmHg — ABNORMAL HIGH (ref 35.0–45.0)
pH, Arterial: 7.187 — CL (ref 7.350–7.450)
pH, Arterial: 7.297 — ABNORMAL LOW (ref 7.350–7.450)
pO2, Arterial: 80 mmHg (ref 80.0–100.0)

## 2012-03-30 LAB — GLUCOSE, CAPILLARY
Glucose-Capillary: 113 mg/dL — ABNORMAL HIGH (ref 70–99)
Glucose-Capillary: 128 mg/dL — ABNORMAL HIGH (ref 70–99)
Glucose-Capillary: 132 mg/dL — ABNORMAL HIGH (ref 70–99)

## 2012-03-30 LAB — MAGNESIUM: Magnesium: 2.7 mg/dL — ABNORMAL HIGH (ref 1.5–2.5)

## 2012-03-30 LAB — COMPREHENSIVE METABOLIC PANEL WITH GFR
ALT: 100 U/L — ABNORMAL HIGH (ref 0–53)
AST: 79 U/L — ABNORMAL HIGH (ref 0–37)
Albumin: 2.3 g/dL — ABNORMAL LOW (ref 3.5–5.2)
Alkaline Phosphatase: 115 U/L (ref 39–117)
BUN: 55 mg/dL — ABNORMAL HIGH (ref 6–23)
CO2: 23 meq/L (ref 19–32)
Calcium: 8.2 mg/dL — ABNORMAL LOW (ref 8.4–10.5)
Chloride: 100 meq/L (ref 96–112)
Creatinine, Ser: 3.02 mg/dL — ABNORMAL HIGH (ref 0.50–1.35)
GFR calc Af Amer: 26 mL/min — ABNORMAL LOW
GFR calc non Af Amer: 22 mL/min — ABNORMAL LOW
Glucose, Bld: 144 mg/dL — ABNORMAL HIGH (ref 70–99)
Potassium: 4.6 meq/L (ref 3.5–5.1)
Sodium: 136 meq/L (ref 135–145)
Total Bilirubin: 0.3 mg/dL (ref 0.3–1.2)
Total Protein: 6.3 g/dL (ref 6.0–8.3)

## 2012-03-30 LAB — CBC
HCT: 31.4 % — ABNORMAL LOW (ref 39.0–52.0)
Hemoglobin: 10 g/dL — ABNORMAL LOW (ref 13.0–17.0)
MCH: 28.9 pg (ref 26.0–34.0)
MCHC: 31.8 g/dL (ref 30.0–36.0)
MCV: 90.8 fL (ref 78.0–100.0)
Platelets: 96 K/uL — ABNORMAL LOW (ref 150–400)
RBC: 3.46 MIL/uL — ABNORMAL LOW (ref 4.22–5.81)
RDW: 15.1 % (ref 11.5–15.5)
WBC: 8.7 K/uL (ref 4.0–10.5)

## 2012-03-30 LAB — COMPREHENSIVE METABOLIC PANEL
ALT: 86 U/L — ABNORMAL HIGH (ref 0–53)
Alkaline Phosphatase: 110 U/L (ref 39–117)
CO2: 23 mEq/L (ref 19–32)
GFR calc Af Amer: 29 mL/min — ABNORMAL LOW (ref >90)
GFR calc non Af Amer: 25 mL/min — ABNORMAL LOW (ref >90)
Glucose, Bld: 162 mg/dL — ABNORMAL HIGH (ref 70–99)
Potassium: 4.5 mEq/L (ref 3.5–5.1)
Sodium: 138 mEq/L (ref 135–145)

## 2012-03-30 LAB — BLOOD GAS, ARTERIAL
Acid-base deficit: 2.4 mmol/L — ABNORMAL HIGH (ref 0.0–2.0)
Bicarbonate: 23.7 meq/L (ref 20.0–24.0)
Drawn by: 31101
FIO2: 0.4 %
MECHVT: 470 mL
O2 Saturation: 92.6 %
PEEP: 8 cmH2O
Patient temperature: 98.6
RATE: 30 {breaths}/min
TCO2: 25.3 mmol/L (ref 0–100)
pCO2 arterial: 54.7 mmHg — ABNORMAL HIGH (ref 35.0–45.0)
pH, Arterial: 7.259 — ABNORMAL LOW (ref 7.350–7.450)
pO2, Arterial: 79.1 mmHg — ABNORMAL LOW (ref 80.0–100.0)

## 2012-03-30 LAB — PHOSPHORUS: Phosphorus: 5.3 mg/dL — ABNORMAL HIGH (ref 2.3–4.6)

## 2012-03-30 NOTE — Progress Notes (Signed)
CVVHD management.  Metabolically stable. Off pressors.  Will try to increase fluid removal. ABG's pending.

## 2012-03-30 NOTE — Progress Notes (Signed)
Name: Gregory [name] MRN: [id number] DOB: 1961/03/08    LOS: 5  Referring Provider:  EDP Reason for Referral:  Acute respiratory failure  PULMONARY / CRITICAL CARE MEDICINE  Brief patient description:  51 yo, hx obesity, presumed OHS, COPD, DM, brought to [facility] ED on 10/8 with hypotension and respiratory distress and intubated for acute respiratory failure and diffuse B infiltrates.   Events Since Admission: 10/8  Brought to [facility] ED on 10/8 in respiratory distress and intubated for B infiltrates, shock 10/9 placed on ARDS 10/9 Positive blood cultures reported 10/10 HD cath placed, CVVH started  Current Status: Critical  Subjective:  CVVHD continues, tolerating some volume removal Pressors off PEEP and FiO2 needs improving  Vital Signs: Temp:  [96.4 F (35.8 C)-97.8 F (36.6 C)] 97.7 F (36.5 C) (10/13 0700) Pulse Rate:  [60-83] 82  (10/13 0700) Resp:  [19-35] 25  (10/13 0700) BP: (100-137)/(51-77) 116/66 mmHg (10/13 0700) SpO2:  [86 %-99 %] 86 % (10/13 0829) FiO2 (%):  [40 %] 40 % (10/13 0843) Weight:  [123.3 kg (271 lb 13.2 oz)] 123.3 kg (271 lb 13.2 oz) (10/13 0400)  Intake/Output Summary (Last 24 hours) at 03/30/12 0920 Last data filed at 03/30/12 0700  Gross per 24 hour  Intake 1664.68 ml  Output   2640 ml  Net -975.32 ml   Physical Examination: General:  Mechanically ventilated Neuro:  Sedated, nonfocal.  HEENT:  ETT in place Cardiovascular: RRR Lungs:  Bilateral air entry, diminished bases Abdomen:  Soft, nontender,  bowel sounds hypoactive  Musculoskeletal:  Atraumatic Skin:  No rash. Dry skin on feet  Active Problems:  Pneumococcal pneumonia  Acute respiratory failure  Septic shock(785.52)  Diabetes  Hyperglycemia  Acute encephalopathy  Acute renal failure  ASSESSMENT AND PLAN  PULMONARY  Lab 03/30/12 0416 03/29/12 0200 03/29/12 0022 03/27/12 1001 03/27/12 0114  PHART 7.259* 7.247* 7.201* 7.201* 7.161*  PCO2ART 54.7* 50.9* 56.3* 52.5* 59.4*    PO2ART 79.1* 96.7 165.0* 85.0 80.0  HCO3 23.7 21.4 21.2 20.5 21.0  O2SAT 92.6 98.0 98.8 93.0 91.0   Ventilator Settings: Vent Mode:  [-] PRVC FiO2 (%):  [40 %] 40 % Set Rate:  [30 bmp] 30 bmp Vt Set:  [470 mL] 470 mL PEEP:  [8 cmH20] 8 cmH20 Plateau Pressure:  [14 cmH20-32 cmH20] 32 cmH20  CXR:  10/13> Slight improvement B infiltrates, LLL continues to be more focally consolidated ETT:  10/8 >>> Bedside u/s for eval of L pleural effusion: 10/11>> Only small amount of fluid. Not enough to sample  A:  Acute respiratory failure.  Pneumonia (CAP vs. Viral) +/- ARDS.  Less likely pulmonary edema.  Suspected COPD (without evidence of exacerbation) - smokes 2-3 packs of cigarettes per day. P:   - Goal SpO2 > 92, pH . 7.20+. Repeat ABG shows pH of 7.2 - ARDS protocol 10/9 >> change Vt back to 6cc/kg on 10/12, tolerate pH ~ 7.2. If unable to maintain w permissive hypercapnia then will ask Dr Briant Cedar to adjust bicarb in CVVHD - Daily SBT when able to tolerate - Con't combivent scheduled. Per report, patient has increased agitation and desats with treatment - Abx as below (changed 03/28/12) - agree with volume removal per CVVHD now that pressors off, based on CVP goals for ARDS  CARDIOVASCULAR  Lab 03/27/12 1600 03/26/12 0737 03/26/12 0736 03/26/12 0137 03/26/12 0136 03/25/12 2007 03/25/12 2001 03/25/12 1648 03/25/12 1647  TROPONINI 0.77* 1.94* -- 1.98* -- -- 0.61* -- --  LATICACIDVEN -- -- 1.2 --  1.7 4.6* -- -- 5.7*  PROBNP -- -- -- -- -- -- -- 7610.0* --   ECG:  10/11 NSR on monitor, rate controlled in 60's Lines: L IJ TLC 10/8 >>> R HD Cath 10/10>> TTE: Mild LVH. Systolic function was normal. The estimated ejection fraction was in the range of 50% to 55%.  A: Septic Shock.  Less likely acute systolic congestive heart failure with cardiogenic shock.  NSTEMI likely secondary to shock, hypoxemia. - Troponin plateau and trending down Afib 10/9 >> started on amio P:  - Goal MAP >  60 - Amiodarone for Afib- will change to PO today - Cortisol level 11 >> hydrocort initiated 10/9  RENAL  Lab 03/30/12 0410 03/29/12 1600 03/29/12 0308 03/28/12 1600 03/28/12 0240 03/27/12 0500 03/26/12 0442  NA 136 136 136 135 136 -- --  K 4.6 4.5 -- -- -- -- --  CL 100 101 100 98 99 -- --  CO2 23 23 21 20 19  -- --  BUN 55* 57* 59* 68* 79* -- --  CREATININE 3.02* 3.41* 3.78* 4.53* 5.65* -- --  CALCIUM 8.2* 7.7* 7.4* 7.1* 6.9* -- --  MG 2.7* -- -- -- 2.1 1.7 1.4*  PHOS 5.3* 4.7* -- 5.8* 6.8* 8.2* --   Intake/Output      10/12 0701 - 10/13 0700 10/13 0701 - 10/14 0700   P.O. 30    I.V. (mL/kg) 1267.4 (10.3)    NG/GT 510    IV Piggyback 50    Total Intake(mL/kg) 1857.4 (15.1)    Urine (mL/kg/hr) 263 (0.1)    Other 2521    Total Output 2784    Net -926.6         Stool Occurrence 1 x     Foley:  10/8 >>>  A:  Dehydration.  ARF likely ATN Hypomagnesemia Hypophosphatemia>>hyperphosphatemia Hyperkalemia- Resolved Started on CVVH 10/10  P:   - Continue CVVH per renal.  - Agree with volume removal on CVVHD now that pressors off - Goal CVP 10 to12. Currently 16 - Trend BMet  GASTROINTESTINAL  Lab 03/30/12 0410 03/29/12 1600 03/29/12 0308 03/28/12 1600 03/28/12 0240 03/27/12 1600 03/25/12 1647  AST 79* -- 125* -- 336* 255* 40*  ALT 100* -- 112* -- 139* 102* 33  ALKPHOS 115 -- 95 -- 99 112 68  BILITOT 0.3 -- 0.3 -- 0.3 0.2* 0.5  PROT 6.3 -- 5.8* -- 6.0 6.2 6.8  ALBUMIN 2.3* 2.1* 2.0* 2.1* 2.1* -- --   A:  Suspected malnutrition. Mild AST elevation P:   - LFTs have plateaued - Trickle TF started 10/9  HEMATOLOGIC  Lab 03/30/12 0410 03/29/12 0308 03/28/12 0240 03/27/12 1000 03/27/12 0500 03/26/12 0442  HGB 10.0* 9.2* 10.3* -- 10.9* 11.4*  HCT 31.4* 28.3* 30.7* -- 33.6* 33.6*  PLT 96* 104* 127* -- 135* 169  INR -- 1.08 1.14 1.16 -- --  APTT -- -- -- -- -- --   A:  Thrombocytopenia, etiology unclear. ? Heparin, ? Other meds P:  - stop heparin, place SCD -  check HITT panel - Trend CBC   INFECTIOUS  Lab 03/30/12 0410 03/29/12 0308 03/28/12 0240 03/27/12 0500 03/26/12 0442 03/25/12 1647  WBC 8.7 4.4 10.2 7.8 7.2 --  PROCALCITON -- -- -- 40.71 39.19 80.11   Cultures: 10/8 urine>>no growth 10/8  Blood >>> GPC in pairs >> Strep pneumo 10/8  Respiratory >>> Strep pneumo 10/9 nasal influ>> neg  Antibiotics: Azithromycin 10/8 x 1 Ceftriaxone 10/8 x 1 Zosyn 10/8 >>>  10/11 Vancomycin 10/8 >>> 10/11 Levaquin 10/8 (has best MIC vs pneumococcus at this hospital, higher than ceftriaxone) >>>  Tamiflu 10/9>>10/10  A:  Pneumonia (CAP vs aspiration),  pneumococcal PNA/sepsis P:   - Antibiotics / cultures as above - Trend PCT> stable  ENDOCRINE  Lab 03/30/12 0750 03/30/12 0400 03/30/12 0038 03/29/12 1937 03/29/12 1459  GLUCAP 135* 128* 113* 110* 90   A:  DM. P:   - ICU glycemic control protocol - Hold Metformin  NEUROLOGIC  A:  Acute encephalopathy.  History of diabetic neuropathy. P:   - Goal RASS 0 to -1 - Daily WUA - Fentanyl / Versed gtt   BEST PRACTICE / DISPOSITION Level of Care:  ICU Primary Service:  PCCM Consultants:  None Code Status:  Full Diet:  NPO- trickle tube feeds DVT Px:  Protonix GI Px:  Heparin Skin Integrity:  Intact Social / Family:    CC time 35 minutes  Levy Pupa, MD, PhD 03/30/2012, 9:20 AM [facility] [phone] or if no answer [phone]

## 2012-03-30 NOTE — Progress Notes (Signed)
ANTIBIOTIC CONSULT NOTE - FOLLOW UP  Pharmacy Consult for levaquin Indication: PNA  No Known Allergies  Patient Measurements: Height: 6\' 1"  (185.4 cm) Weight: 271 lb 13.2 oz (123.3 kg) IBW/kg (Calculated) : 79.9    Vital Signs: Temp: 97.1 F (36.2 C) (10/13 0800) BP: 114/56 mmHg (10/13 0800) Pulse Rate: 74  (10/13 0800) Intake/Output from previous day: 10/12 0701 - 10/13 0700 In: 1857.4 [P.O.:30; I.V.:1267.4; NG/GT:510; IV Piggyback:50] Out: 2784 [Urine:263] Intake/Output from this shift: Total I/O In: -  Out: 333 [Other:333]  Labs:  [facility] 03/30/12 0410 03/29/12 1600 03/29/12 0308 03/28/12 0240  WBC 8.7 -- 4.4 10.2  HGB 10.0* -- 9.2* 10.3*  PLT 96* -- 104* 127*  LABCREA -- -- -- --  CREATININE 3.02* 3.41* 3.78* --   Estimated Creatinine Clearance: 39.4 ml/min (by C-G formula based on Cr of 3.02).   Assessment: 51 yo male with pneumococcal PNA on levaquin (sensitive per cultures).  Patent note on CVVH and dose is appropriate.  Azithromycin 10/8 x 1  Ceftriaxone 10/8 x 1  Zosyn 10/8 >>> 10/11  Vancomycin 10/8 >>> 10/11  Levaquin 10/8>  10/8 Blood x 2 >> 2/2 strep (S to levaquin) 10/8 Urine >> NG 10/8 Trach Asp >> Strep PN. candida  Plan:  -No levaquin dose changes needed -Consider defining length of therapy?  Harland German, Pharm D 03/30/2012 12:23 PM

## 2012-03-31 LAB — PHOSPHORUS: Phosphorus: 5.4 mg/dL — ABNORMAL HIGH (ref 2.3–4.6)

## 2012-03-31 LAB — COMPREHENSIVE METABOLIC PANEL
Albumin: 2.4 g/dL — ABNORMAL LOW (ref 3.5–5.2)
Alkaline Phosphatase: 108 U/L (ref 39–117)
BUN: 55 mg/dL — ABNORMAL HIGH (ref 6–23)
Creatinine, Ser: 2.6 mg/dL — ABNORMAL HIGH (ref 0.50–1.35)
Potassium: 4.6 mEq/L (ref 3.5–5.1)
Total Protein: 6.2 g/dL (ref 6.0–8.3)

## 2012-03-31 LAB — CBC
Hemoglobin: 10.1 g/dL — ABNORMAL LOW (ref 13.0–17.0)
RBC: 3.47 MIL/uL — ABNORMAL LOW (ref 4.22–5.81)

## 2012-03-31 LAB — POCT I-STAT 3, ART BLOOD GAS (G3+)
Acid-base deficit: 1 mmol/L (ref 0.0–2.0)
Patient temperature: 97.6
TCO2: 28 mmol/L (ref 0–100)
pH, Arterial: 7.295 — ABNORMAL LOW (ref 7.350–7.450)

## 2012-03-31 LAB — MAGNESIUM: Magnesium: 2.8 mg/dL — ABNORMAL HIGH (ref 1.5–2.5)

## 2012-03-31 LAB — GLUCOSE, CAPILLARY
Glucose-Capillary: 135 mg/dL — ABNORMAL HIGH (ref 70–99)
Glucose-Capillary: 122 mg/dL — ABNORMAL HIGH (ref 70–99)

## 2012-03-31 MED ORDER — FENTANYL BOLUS VIA INFUSION
50.0000 ug | Freq: Four times a day (QID) | INTRAVENOUS | Status: DC | PRN
  Filled 2012-03-31: qty 100

## 2012-03-31 MED ORDER — HYDROCORTISONE SOD SUCCINATE 100 MG IJ SOLR
50.0000 mg | Freq: Three times a day (TID) | INTRAMUSCULAR | Status: DC
  Administered 2012-03-31 – 2012-04-03 (×8): 50 mg via INTRAVENOUS
  Filled 2012-03-31 (×12): qty 1

## 2012-03-31 MED ORDER — ALBUTEROL SULFATE (5 MG/ML) 0.5% IN NEBU
2.5000 mg | INHALATION_SOLUTION | RESPIRATORY_TRACT | Status: DC | PRN
Start: 2012-03-31 — End: 2012-04-17
  Administered 2012-03-31 (×2): 2.5 mg via RESPIRATORY_TRACT
  Filled 2012-03-31 (×4): qty 0.5

## 2012-03-31 MED ORDER — MIDAZOLAM BOLUS VIA INFUSION: 1.0000 mg | Freq: Three times a day (TID) | INTRAVENOUS | Status: DC

## 2012-03-31 MED ORDER — SODIUM CHLORIDE 0.9 % IV SOLN
50.0000 ug/h | INTRAVENOUS | Status: DC
  Administered 2012-03-31: 400 ug/h via INTRAVENOUS
  Administered 2012-03-31: 350 ug/h via INTRAVENOUS
  Administered 2012-04-01 – 2012-04-02 (×4): 400 ug/h via INTRAVENOUS
  Administered 2012-04-03: 300 ug/h via INTRAVENOUS
  Administered 2012-04-03: 400 ug/h via INTRAVENOUS
  Administered 2012-04-03: 300 ug/h via INTRAVENOUS
  Administered 2012-04-03: 400 ug/h via INTRAVENOUS
  Administered 2012-04-04: 325 ug/h via INTRAVENOUS
  Administered 2012-04-05 – 2012-04-06 (×3): 200 ug/h via INTRAVENOUS
  Filled 2012-03-31 (×20): qty 50

## 2012-03-31 MED ORDER — PRO-STAT SUGAR FREE PO LIQD
60.0000 mL | Freq: Three times a day (TID) | ORAL | Status: DC
  Administered 2012-03-31 – 2012-04-10 (×29): 60 mL
  Filled 2012-03-31 (×32): qty 60

## 2012-03-31 MED ORDER — PROPOFOL 10 MG/ML IV EMUL
5.0000 ug/kg/min | INTRAVENOUS | Status: DC
  Administered 2012-03-31: 40 ug/kg/min via INTRAVENOUS
  Administered 2012-03-31: 8 ug/kg/min via INTRAVENOUS
  Administered 2012-03-31: 15.004 ug/kg/min via INTRAVENOUS
  Administered 2012-03-31: 39.876 ug/kg/min via INTRAVENOUS
  Administered 2012-04-01 (×2): 50 ug/kg/min via INTRAVENOUS
  Administered 2012-04-01: 40 ug/kg/min via INTRAVENOUS
  Administered 2012-04-01 (×3): 50 ug/kg/min via INTRAVENOUS
  Administered 2012-04-01: 35 ug/kg/min via INTRAVENOUS
  Administered 2012-04-02: 40.011 ug/kg/min via INTRAVENOUS
  Administered 2012-04-02: 38 ug/kg/min via INTRAVENOUS
  Administered 2012-04-02: 50 ug/kg/min via INTRAVENOUS
  Administered 2012-04-02: 40.011 ug/kg/min via INTRAVENOUS
  Administered 2012-04-02 (×2): 40 ug/kg/min via INTRAVENOUS
  Administered 2012-04-03: 34 ug/kg/min via INTRAVENOUS
  Administered 2012-04-03: 34.063 ug/kg/min via INTRAVENOUS
  Administered 2012-04-03: 34 ug/kg/min via INTRAVENOUS
  Administered 2012-04-03: 34.063 ug/kg/min via INTRAVENOUS
  Administered 2012-04-03 – 2012-04-04 (×3): 34 ug/kg/min via INTRAVENOUS
  Administered 2012-04-04: 34.063 ug/kg/min via INTRAVENOUS
  Filled 2012-03-31 (×26): qty 100

## 2012-03-31 MED ORDER — ACETYLCYSTEINE 20 % IN SOLN
4.0000 mL | RESPIRATORY_TRACT | Status: AC
  Administered 2012-03-31 (×5): 4 mL via RESPIRATORY_TRACT
  Filled 2012-03-31 (×6): qty 4

## 2012-03-31 MED ORDER — IPRATROPIUM BROMIDE 0.02 % IN SOLN
RESPIRATORY_TRACT | Status: AC
  Administered 2012-03-31: 0.5 mg via RESPIRATORY_TRACT
  Filled 2012-03-31: qty 2.5

## 2012-03-31 MED ORDER — PROPOFOL 10 MG/ML IV EMUL
INTRAVENOUS | Status: AC
  Administered 2012-03-31: 8 ug/kg/min via INTRAVENOUS
  Filled 2012-03-31: qty 100

## 2012-03-31 MED ORDER — OXEPA PO LIQD
1000.0000 mL | ORAL | Status: DC
  Administered 2012-03-31 – 2012-04-03 (×3): 1000 mL
  Filled 2012-03-31 (×6): qty 1000

## 2012-03-31 MED ORDER — IPRATROPIUM BROMIDE 0.02 % IN SOLN
0.5000 mg | RESPIRATORY_TRACT | Status: DC
  Administered 2012-03-31 – 2012-04-04 (×28): 0.5 mg via RESPIRATORY_TRACT
  Filled 2012-03-31 (×27): qty 2.5

## 2012-03-31 MED ORDER — ALBUTEROL SULFATE (5 MG/ML) 0.5% IN NEBU
INHALATION_SOLUTION | RESPIRATORY_TRACT | Status: AC
  Administered 2012-03-31: 2.5 mg via RESPIRATORY_TRACT
  Filled 2012-03-31: qty 0.5

## 2012-03-31 MED ORDER — MIDAZOLAM HCL 2 MG/2ML IJ SOLN
1.0000 mg | Freq: Three times a day (TID) | INTRAMUSCULAR | Status: DC
  Administered 2012-03-31 – 2012-04-02 (×3): 1 mg via INTRAVENOUS
  Filled 2012-03-31 (×3): qty 2

## 2012-03-31 MED ORDER — LABETALOL HCL 5 MG/ML IV SOLN
5.0000 mg | INTRAVENOUS | Status: DC | PRN
  Administered 2012-03-31 – 2012-04-09 (×5): 5 mg via INTRAVENOUS
  Filled 2012-03-31 (×6): qty 4

## 2012-03-31 MED ORDER — ALBUTEROL SULFATE (5 MG/ML) 0.5% IN NEBU
2.5000 mg | INHALATION_SOLUTION | RESPIRATORY_TRACT | Status: DC
  Administered 2012-03-31 – 2012-04-04 (×28): 2.5 mg via RESPIRATORY_TRACT
  Filled 2012-03-31 (×25): qty 0.5

## 2012-03-31 NOTE — Progress Notes (Signed)
Nutrition Follow-up  Intervention:    Change TF to Oxepa at 10 ml/h, increase by 10 ml every 4 hours to goal rate of 30 ml/h with 60 ml Prostat TID to provide 1680 kcals, 135 gm protein, 565 ml free water daily.  TF plus Propofol will provide a total of 1973 kcals daily (23.6 kcals/kg ideal weight)  Assessment:   Patient remains intubated on ventilator support.  Has been receiving CVVHD for volume removal since 10/10. MV: 14.1 Temp:Temp (24hrs), Avg:97.5 F (36.4 C), Min:96.9 F (36.1 C), Max:97.9 F (36.6 C)  Propofol at 11.1 ml/hr providing 293 kcals/day.  Spoke with CCM resident physician, Rodman Pickle, MD regarding advancing TF to goal rate.  She is okay with changing to Oxepa TF since patient is still on the ARDS protocol.    Diet Order:  Jevity 1.2 at 10 ml/h providing 288 kcals, 13 gm protein, 194 ml free water daily.  Meds: Scheduled Meds:   . acetylcysteine  4 mL Nebulization Q4H  . albuterol  2.5 mg Nebulization Q4H  . albuterol-ipratropium  6 puff Inhalation Q4H  . amiodarone  200 mg Per Tube BID  . antiseptic oral rinse  1 application Mouth Rinse QID  . chlorhexidine  15 mL Mouth/Throat BID  . feeding supplement (JEVITY 1.2 CAL)  1,000 mL Per Tube Q24H  . hydrocortisone sod succinate (SOLU-CORTEF) injection  50 mg Intravenous Q8H  . insulin aspart  2-6 Units Subcutaneous Q4H  . ipratropium  0.5 mg Nebulization Q4H  . levofloxacin (LEVAQUIN) IV  250 mg Intravenous Q24H  . midazolam  1 mg Intravenous Q8H  . pantoprazole sodium  40 mg Per Tube QHS  . sodium chloride  1,000 mL Intravenous Once  . DISCONTD: hydrocortisone sod succinate (SOLU-CORTEF) injection  100 mg Intravenous Q8H  . DISCONTD: midazolam  1 mg Intravenous Q8H   Continuous Infusions:   . sodium chloride 20 mL/hr at 03/30/12 1900  . fentaNYL infusion INTRAVENOUS 350 mcg/hr (03/31/12 0922)  . midazolam (VERSED) infusion Stopped (03/31/12 0100)  . dialysis replacement fluid (prismasate) 200 mL/hr  at 03/30/12 2351  . dialysis replacement fluid (prismasate) 300 mL/hr at 03/31/12 0101  . dialysate (PRISMASATE) 2,000 mL/hr at 03/31/12 0922  . propofol 15.004 mcg/kg/min (03/31/12 0921)  . DISCONTD: fentaNYL infusion INTRAVENOUS 400 mcg/hr (03/30/12 2331)   PRN Meds:.acetaminophen (TYLENOL) oral liquid 160 mg/5 mL, albuterol, fentaNYL, heparin, sodium chloride, DISCONTD: albuterol-ipratropium  Labs:  CMP     Component Value Date/Time   NA 135 03/31/2012 0400   K 4.6 03/31/2012 0400   CL 100 03/31/2012 0400   CO2 23 03/31/2012 0400   GLUCOSE 187* 03/31/2012 0400   BUN 55* 03/31/2012 0400   CREATININE 2.60* 03/31/2012 0400   CALCIUM 8.2* 03/31/2012 0400   PROT 6.2 03/31/2012 0400   ALBUMIN 2.4* 03/31/2012 0400   AST 54* 03/31/2012 0400   ALT 85* 03/31/2012 0400   ALKPHOS 108 03/31/2012 0400   BILITOT 0.3 03/31/2012 0400   GFRNONAA 27* 03/31/2012 0400   GFRAA 31* 03/31/2012 0400     Intake/Output Summary (Last 24 hours) at 03/31/12 1058 Last data filed at 03/31/12 0700  Gross per 24 hour  Intake 1690.82 ml  Output   4614 ml  Net -2923.18 ml    Weight Status:  119.6 kg down from 122.5 kg on 10/10 with negative fluid status  Re-estimated needs:  2355 kcals, 150-165 gm protein, 2.4 liters fluid daily  Nutrition Dx:  Inadequate oral intake related to inability to eat  as evidenced by NPO status, ongoing.  Goal:  Enteral nutrition to provide 60-70% of estimated calorie needs (22-25 kcals/kg ideal body weight) and >/= 90% of estimated protein needs, based on ASPEN guidelines for permissive underfeeding in critically ill obese individuals, unmet.  Monitor:  TF tolerance, labs, weight trend, vent status, renal function   Joaquin Courts, RD, LDN, CNSC Pager# [phone] After Hours Pager# [phone]

## 2012-03-31 NOTE — Progress Notes (Signed)
eLink Physician-Brief Progress Note Patient Name: Gregory [name] DOB: [birth year] MRN: [id number]  Date of Service  03/31/2012   HPI/Events of Note  Thick tracheal secretions appeared to be involved in paitent's resp distress earlier.  Large volume suctioned    eICU Interventions  Plan: Mucomyst neb q4 hours for 24 hours Percussion vest therapy   Intervention Category Intermediate Interventions: Respiratory distress - evaluation and management  DETERDING,ELIZABETH 03/31/2012, 1:32 AM

## 2012-03-31 NOTE — Progress Notes (Signed)
Name: Gregory [name] MRN: [id number] DOB: 11-12-1960    LOS: 6  Referring Provider:  EDP Reason for Referral:  Acute respiratory failure  PULMONARY / CRITICAL CARE MEDICINE  Brief patient description:  51 yo, hx obesity, presumed OHS, COPD, DM, brought to [facility] ED on 10/8 with hypotension and respiratory distress and intubated for acute respiratory failure and diffuse B infiltrates.   Events Since Admission: 10/8  Brought to [facility] ED on 10/8 in respiratory distress and intubated for B infiltrates, shock 10/9 placed on ARDS 10/9 Positive blood cultures reported 10/10 HD cath placed, CVVH started 10/13 Pressors off  Current Status: Critical  Subjective:  Overnight patient had increased thick secretions and mucomyst was added. He also had some hypertension and coughing therefore propofol was added as well. CVVHD continues, tolerating some volume removal  Vital Signs: Temp:  [97.1 F (36.2 C)-97.9 F (36.6 C)] 97.4 F (36.3 C) (10/14 0700) Pulse Rate:  [70-94] 77  (10/14 0700) Resp:  [12-36] 16  (10/14 0700) BP: (114-175)/(56-95) 141/67 mmHg (10/14 0700) SpO2:  [86 %-100 %] 98 % (10/14 0700) FiO2 (%):  [40 %] 40 % (10/14 0700) Weight:  [263 lb 10.7 oz (119.6 kg)] 263 lb 10.7 oz (119.6 kg) (10/14 0500)  Intake/Output Summary (Last 24 hours) at 03/31/12 0721 Last data filed at 03/31/12 0700  Gross per 24 hour  Intake 1900.82 ml  Output   4947 ml  Net -3046.18 ml   Physical Examination: General:  Mechanically ventilated Neuro:  Sedated, nonfocal.  HEENT:  ETT in place Cardiovascular: RRR Lungs:  Bilateral air entry, diminished bases, coarse throughout L>R Abdomen:  Soft, nontender,  +BS Musculoskeletal:  Atraumatic Skin:  No rash. Dry skin on feet  Active Problems:  Pneumococcal pneumonia  Acute respiratory failure  Septic shock(785.52)  Diabetes  Hyperglycemia  Acute encephalopathy  Acute renal failure  ASSESSMENT AND PLAN  PULMONARY  Lab 03/31/12 0526 03/30/12  0416 03/29/12 2130 03/29/12 1608 03/29/12 0200  PHART 7.295* 7.259* 7.272* 7.297* 7.247*  PCO2ART 53.0* 54.7* 54.0* 48.6* 50.9*  PO2ART 79.0* 79.1* 71.0* 74.0* 96.7  HCO3 26.0* 23.7 25.0* 24.0 21.4  O2SAT 94.0 92.6 92.0 94.0 98.0   Ventilator Settings: Vent Mode:  [-] PRVC FiO2 (%):  [40 %] 40 % Set Rate:  [30 bmp] 30 bmp Vt Set:  [470 mL] 470 mL PEEP:  [8 cmH20] 8 cmH20 Plateau Pressure:  [18 cmH20-21 cmH20] 20 cmH20  CXR:  10/13> Slight improvement B infiltrates, LLL continues to be more focally consolidated ETT:  10/8 >>> Bedside u/s for eval of L pleural effusion: 10/11>> Only small amount of fluid. Not enough to sample  A:  Acute respiratory failure.  Pneumonia (CAP vs. Viral) +/- ARDS.  Less likely pulmonary edema.  Suspected COPD (without evidence of exacerbation) - smokes 2-3 packs of cigarettes per day. P:   - Goal SpO2 > 92, pH . 7.20+. ABG shows pH 7.295 - ARDS protocol 10/9 >> change Vt back to 6cc/kg on 10/12,permisive ok  -no SBT until MV lower, goal to peep 5 if able, likely see am pao2 - Con't combivent scheduled - Abx as below (changed 03/28/12) - Con't volume removal per CVVHD now that pressors off, based on CVP goals for ARDS -neg balance goal  CARDIOVASCULAR  Lab 03/27/12 1600 03/26/12 0737 03/26/12 0736 03/26/12 0137 03/26/12 0136 03/25/12 2007 03/25/12 2001 03/25/12 1648 03/25/12 1647  TROPONINI 0.77* 1.94* -- 1.98* -- -- 0.61* -- --  LATICACIDVEN -- -- 1.2 -- 1.7 4.6* -- --  5.7*  PROBNP -- -- -- -- -- -- -- 7610.0* --   ECG:  10/11 NSR on monitor, rate controlled in 60's Lines: L IJ TLC 10/8 >>> R HD Cath 10/10>> A line L radial 10/13>> TTE: Mild LVH. Systolic function was normal. The estimated ejection fraction was in the range of 50% to 55%.  A: Septic Shock.  Less likely acute systolic congestive heart failure with cardiogenic shock.  NSTEMI likely secondary to shock, hypoxemia. - Troponin plateau and trending down Afib 10/9 >> started on amio P:   - Goal MAP > 60 - Amiodarone for Afib PO per tube - Cortisol level 11 >> hydrocort initiated 10/9. Con't 100 mg q8, taper   RENAL  Lab 03/31/12 0400 03/30/12 1830 03/30/12 0410 03/29/12 1600 03/29/12 0308 03/28/12 1600 03/28/12 0240 03/27/12 0500 03/26/12 0442  NA 135 138 136 136 136 -- -- -- --  K 4.6 4.5 -- -- -- -- -- -- --  CL 100 102 100 101 100 -- -- -- --  CO2 23 23 23 23 21  -- -- -- --  BUN 55* 54* 55* 57* 59* -- -- -- --  CREATININE 2.60* 2.77* 3.02* 3.41* 3.78* -- -- -- --  CALCIUM 8.2* 8.4 8.2* 7.7* 7.4* -- -- -- --  MG 2.8* -- 2.7* -- -- -- 2.1 1.7 1.4*  PHOS 5.4* -- 5.3* 4.7* -- 5.8* 6.8* -- --   Intake/Output      10/13 0701 - 10/14 0700 10/14 0701 - 10/15 0700   P.O.     I.V. (mL/kg) 1490.8 (12.5)    NG/GT 360    IV Piggyback 50    Total Intake(mL/kg) 1900.8 (15.9)    Urine (mL/kg/hr) 500 (0.2)    Other 4446    Stool 1    Total Output 4947    Net -3046.2          Foley:  10/8 >>>  A:  Dehydration.  ARF likely ATN Hypomagnesemia Hypophosphatemia>>hyperphosphatemia Hyperkalemia- Resolved Started on CVVH 10/10  P:   - Continue CVVH per renal.  - Continue volume removal on CVVHD now that pressors off - Goal CVP 10 to12.  - Trend BMet  GASTROINTESTINAL  Lab 03/31/12 0400 03/30/12 1830 03/30/12 0410 03/29/12 1600 03/29/12 0308 03/28/12 0240  AST 54* 58* 79* -- 125* 336*  ALT 85* 86* 100* -- 112* 139*  ALKPHOS 108 110 115 -- 95 99  BILITOT 0.3 0.3 0.3 -- 0.3 0.3  PROT 6.2 6.2 6.3 -- 5.8* 6.0  ALBUMIN 2.4* 2.3* 2.3* 2.1* 2.0* --   A:  Suspected malnutrition. LFT elevation P:   - LFTs have plateaued and trending down - Trickle TF started 10/9, increase  HEMATOLOGIC  Lab 03/31/12 0400 03/30/12 0410 03/29/12 0308 03/28/12 0240 03/27/12 1000 03/27/12 0500  HGB 10.1* 10.0* 9.2* 10.3* -- 10.9*  HCT 31.5* 31.4* 28.3* 30.7* -- 33.6*  PLT 104* 96* 104* 127* -- 135*  INR -- -- 1.08 1.14 1.16 --  APTT -- -- -- -- -- --   A:  Thrombocytopenia,  etiology unclear. ? Heparin, ? Other meds P:  - No heparin, place SCD - HITT panel 10/13 in process - Trend CBC   INFECTIOUS  Lab 03/31/12 0400 03/30/12 0410 03/29/12 0308 03/28/12 0240 03/27/12 0500 03/26/12 0442 03/25/12 1647  WBC 9.7 8.7 4.4 10.2 7.8 -- --  PROCALCITON 2.86 -- -- -- 40.71 39.19 80.11   Cultures: 10/8 urine>>no growth 10/8  Blood >>> GPC in pairs >> Strep  pneumo 10/8  Respiratory >>> Strep pneumo 10/9 nasal influ>> neg  Antibiotics: Azithromycin 10/8 x 1 Ceftriaxone 10/8 x 1 Zosyn 10/8 >>> 10/11 Vancomycin 10/8 >>> 10/11 Levaquin 10/8 (has best MIC vs pneumococcus at this hospital, higher than ceftriaxone) >>>  Tamiflu 10/9>>10/10  A:  Pneumonia (CAP vs aspiration),  pneumococcal PNA/sepsis P:   - Antibiotics / cultures as above, add stop date total 10 days - Trend PCT> stable  ENDOCRINE  Lab 03/31/12 0436 03/31/12 0011 03/30/12 1946 03/30/12 1727 03/30/12 1131  GLUCAP 173* 118* 138* 127* 132*   A:  DM. P:   - ICU glycemic control protocol  NEUROLOGIC  A:  Acute encephalopathy.  History of diabetic neuropathy. P:   - Goal RASS 0 to -1 - Daily WUA - Propofol started overnight early AM 10/14 - Continue fentanyl gtt - Consider adding back benzo given history of alcohol use  BEST PRACTICE / DISPOSITION Level of Care:  ICU Primary Service:  PCCM Consultants:  None Code Status:  Full Diet:  NPO- trickle tube feeds DVT Px:  Protonix GI Px:  Heparin Skin Integrity:  Intact Social / Family: Will contact daughters  Hospital doctor M. Hairford, M.D. 03/31/2012 7:22 AM   Ccm time 25 min   I have fully examined this patient and agree with above findings.    And edited in tull.  Mcarthur Rossetti. Tyson Alias, MD, FACP Pgr: [phone] [facility]

## 2012-03-31 NOTE — Progress Notes (Signed)
eLink Physician-Brief Progress Note Patient Name: Gregory [name] DOB: 03-13-1961 MRN: [id number]  Date of Service  03/31/2012   HPI/Events of Note  Patient currently on max F/V sedation with issues of hypertension and coughing on vent.  RR up in the 35 to 40s.    eICU Interventions  Plan: Start on propofol sedation protocol to see if this will improved overall sedation.  Continue with Fentanyl.      DETERDING,ELIZABETH 03/31/2012, 12:58 AM

## 2012-03-31 NOTE — Progress Notes (Signed)
Clinical Social Work Department BRIEF PSYCHOSOCIAL ASSESSMENT 03/31/2012  Patient:  Gregory [name], Gregory [name]     Account Number:  [id number]     Admit date:  03/25/2012  Clinical Social Worker:  Dennison Bulla  Date/Time:  03/31/2012 01:45 PM  Referred by:  RN  Date Referred:  03/31/2012 Referred for  Psychosocial assessment   Other Referral:   Interview type:  Family Other interview type:    PSYCHOSOCIAL DATA Living Status:  ALONE Admitted from facility:   Level of care:   Primary support name:  [name] Primary support relationship to patient:  CHILD, ADULT Degree of support available:   Strong    CURRENT CONCERNS Current Concerns  Other - See comment   Other Concerns:   Financial concerns/POA    SOCIAL WORK ASSESSMENT / PLAN CSW received referral from RN reporting that patient's family had questions for CSW. CSW reviewed chart and met with dtr at bedside. Patient intubated and unable to participate in assessment.    CSW introduced myself and explained role. Patient lives alone and is divorced. Patient has two dtrs aged 62 and 56. CSW met with 8 year old dtr Industrial/product designer) who reported that she is in college but trying to assist patient with bills. Patient has a girlfriend and a brother. Patient's parents are dead. Dtr reports that she is managing patient's affairs but is not his HCPOA or POA. CSW explained the HCPOA cannot be completed at this time since patient does not have capacity at this time. CSW encouraged dtr to contact lawyer regarding POA. Dtr reports she has called some businesses who have sent bills in the mail and will contact a lawyer to assist as needed.    CSW allowed dtr time to process feelings regarding patient being admitted to the hospital. Dtr is relying on support from mother and sister. Patient and ex-wife have a good relationship despite divorce per dtr. Dtr is in nursing school and has been visiting patient at the hospital. Dtr reports that it is stressful to have  patient in the hospital and is unsure of dc plans. Dtr is worried about patient returning home alone. CSW explained process of PT/OT working with patient and possible SNF placement if needed. Dtr agreeable to plan.    CSW is available if needed.   Assessment/plan status:  Psychosocial Support/Ongoing Assessment of Needs Other assessment/ plan:   Information/referral to community resources:   Referral to lawyer for POA information    PATIENT'S/FAMILY'S RESPONSE TO PLAN OF CARE: Patient unable to participate in assessment. Dtr engaged throughout assessment and appreciative of CSW consult. Dtr was thankful for CSW consult. Dtr agreeable for CSW to continue to follow.

## 2012-03-31 NOTE — Progress Notes (Signed)
Subjective:  No obtainable as patient intubated  Objective:    Vital signs in last 24 hours: Filed Vitals:   03/31/12 0500 03/31/12 0503 03/31/12 0600 03/31/12 0700  BP: 137/63  131/60 141/67  Pulse: 76  73 77  Temp: 97.4 F (36.3 C)  97.4 F (36.3 C) 97.4 F (36.3 C)  TempSrc:      Resp: 22  21 16   Height:      Weight: 119.6 kg (263 lb 10.7 oz)     SpO2: 98% 99% 100% 98%   Weight change: -3.7 kg (-8 lb 2.5 oz)  Intake/Output Summary (Last 24 hours) at 03/31/12 0830 Last data filed at 03/31/12 0700  Gross per 24 hour  Intake 1830.82 ml  Output   4856 ml  Net -3025.18 ml    Physical Exam:  Blood pressure 141/67, pulse 77, temperature 97.4 F (36.3 C), temperature source Other (Comment), resp. rate 16, height 6\' 1"  (1.854 m), weight 119.6 kg (263 lb 10.7 oz), SpO2 98.00%. Generalized anasarca Scleral edema ETT, NGT, right IJ HD cath, foley Anteriorly fairly clear Distant heart sounds SR on tele in 70's Abdomen protuberant  Grimaces with palpation of the abdomen 2+ edema of LE's   Lab 03/31/12 0400 03/30/12 1830 03/30/12 0410 03/29/12 1600 03/29/12 0308 03/28/12 1600 03/28/12 0240 03/27/12 0500 03/26/12 0442  NA 135 138 136 136 136 135 136 -- --  K 4.6 4.5 4.6 4.5 4.7 4.8 4.6 -- --  CL 100 102 100 101 100 98 99 -- --  CO2 23 23 23 23 21 20 19  -- --  GLUCOSE 187* 162* 144* 97 146* 156* 141* -- --  BUN 55* 54* 55* 57* 59* 68* 79* -- --  CREATININE 2.60* 2.77* 3.02* 3.41* 3.78* 4.53* 5.65* -- --  ALB -- -- -- -- -- -- -- -- --  CALCIUM 8.2* 8.4 8.2* 7.7* 7.4* 7.1* 6.9* -- --  PHOS 5.4* -- 5.3* 4.7* -- 5.8* 6.8* 8.2* 6.1*     Lab 03/31/12 0400 03/30/12 1830 03/30/12 0410  AST 54* 58* 79*  ALT 85* 86* 100*  ALKPHOS 108 110 115  BILITOT 0.3 0.3 0.3  PROT 6.2 6.2 6.3  ALBUMIN 2.4* 2.3* 2.3*   Lab 03/31/12 0400 03/30/12 0410 03/29/12 0308 03/28/12 0240 03/25/12 1647  WBC 9.7 8.7 4.4 10.2 --  NEUTROABS -- -- -- -- 7.5  HGB 10.1* 10.0* 9.2* 10.3* --  HCT 31.5*  31.4* 28.3* 30.7* --  MCV 90.8 90.8 90.7 90.0 --  PLT 104* 96* 104* 127* --   Lab 03/27/12 1600 03/26/12 0737 03/26/12 0137 03/25/12 2001  CKTOTAL -- -- -- --  CKMB -- -- -- --  CKMBINDEX -- -- -- --  TROPONINI 0.77* 1.94* 1.98* 0.61*     Lab 03/31/12 0436 03/31/12 0011 03/30/12 1946 03/30/12 1727 03/30/12 1131  GLUCAP 173* 118* 138* 127* 132*  Studies/Results: Dg Chest Port 1 View  03/30/2012  *RADIOLOGY REPORT*  Clinical Data: Evaluate endotracheal tube  PORTABLE CHEST - 1 VIEW  Comparison: 03/29/2012  Findings: Endotracheal tube terminates 4 cm above the carina.  Cardiomegaly with mild interstitial edema.  Patchy left lower lobe opacity, atelectasis versus pneumonia.  Suspected small left pleural effusion.  No pneumothorax.  Stable bilateral IJ venous catheters.  IMPRESSION: Endotracheal tube terminates 4 cm above the carina.  Cardiomegaly with mild interstitial edema and suspected small left pleural effusion.  Patchy left lower lobe opacity, atelectasis versus pneumonia.   Original Report Authenticated By: Charline Bills, M.D.  Medications . sodium chloride 20 mL/hr at 03/30/12 1900  . fentaNYL infusion INTRAVENOUS 350 mcg/hr (03/31/12 0300)  . midazolam (VERSED) infusion Stopped (03/31/12 0100)  . dialysis replacement fluid (prismasate) 200 mL/hr at 03/30/12 2351  . dialysis replacement fluid (prismasate) 300 mL/hr at 03/31/12 0101  . dialysate (PRISMASATE) 2,000 mL/hr at 03/31/12 0627  . propofol 15 mcg/kg/min (03/31/12 0500)  . DISCONTD: fentaNYL infusion INTRAVENOUS 400 mcg/hr (03/30/12 2331)  . DISCONTD: phenylephrine (NEO-SYNEPHRINE) Adult infusion Stopped (03/29/12 1000)  . DISCONTD: vasopressin (PITRESSIN) infusion - *FOR SHOCK* Stopped (03/29/12 0800)   . acetylcysteine  4 mL Nebulization Q4H  . albuterol-ipratropium  6 puff Inhalation Q4H  . amiodarone  200 mg Per Tube BID  . antiseptic oral rinse  1 application Mouth Rinse QID  . chlorhexidine  15 mL  Mouth/Throat BID  . feeding supplement (JEVITY 1.2 CAL)  1,000 mL Per Tube Q24H  . hydrocortisone sod succinate (SOLU-CORTEF) injection  100 mg Intravenous Q8H  . insulin aspart  2-6 Units Subcutaneous Q4H  . levofloxacin (LEVAQUIN) IV  250 mg Intravenous Q24H  . pantoprazole sodium  40 mg Per Tube QHS  . sodium chloride  1,000 mL Intravenous Once  . DISCONTD: heparin subcutaneous  5,000 Units Subcutaneous Q8H   I  have reviewed scheduled and prn medications.  ASSESSMENT/RECOMMENDATIONS 51 yo WM with AKI in the setting of pneumococcal pneumonia/sepsis, acute respiratory failure/ARDS/suspected underlying COPD, septic shock (off pressors), NSTEMI, Afib on amio, diabetes; recent baseline creatinine not known to me but was 1.07 in 2010.   AKI - on CRRT since 10/10.   Anasarca Tolerating negative 50/hour; effluent dose 22 ml/kg/hour; on prismasate 4K dialysate and replacement fluids;  no heparin; right IJ catheter;  uop 0.2 ml/kg/hour so remains oliguric.   Goal CVP 10-12 per CCM - last CVP's 14, 8  Continue with current CRRT prescription with neg 50-75 ml/hour UF rate   Camille Bal, MD [facility] [phone] Pager 03/31/2012, 8:30 AM

## 2012-04-01 ENCOUNTER — Inpatient Hospital Stay (HOSPITAL_COMMUNITY): Payer: Self-pay

## 2012-04-01 LAB — COMPREHENSIVE METABOLIC PANEL
ALT: 67 U/L — ABNORMAL HIGH (ref 0–53)
Alkaline Phosphatase: 97 U/L (ref 39–117)
BUN: 57 mg/dL — ABNORMAL HIGH (ref 6–23)
CO2: 24 mEq/L (ref 19–32)
Chloride: 105 mEq/L (ref 96–112)
GFR calc Af Amer: 36 mL/min — ABNORMAL LOW (ref >90)
GFR calc non Af Amer: 31 mL/min — ABNORMAL LOW (ref >90)
Glucose, Bld: 161 mg/dL — ABNORMAL HIGH (ref 70–99)
Potassium: 4.1 mEq/L (ref 3.5–5.1)
Total Bilirubin: 0.3 mg/dL (ref 0.3–1.2)
Total Protein: 6 g/dL (ref 6.0–8.3)

## 2012-04-01 LAB — POCT I-STAT 3, ART BLOOD GAS (G3+)
Bicarbonate: 27 mEq/L — ABNORMAL HIGH (ref 20.0–24.0)
Patient temperature: 97.4
TCO2: 28 mmol/L (ref 0–100)
pH, Arterial: 7.352 (ref 7.350–7.450)
pO2, Arterial: 101 mmHg — ABNORMAL HIGH (ref 80.0–100.0)

## 2012-04-01 LAB — HEPARIN INDUCED THROMBOCYTOPENIA PNL: Heparin Induced Plt Ab: NEGATIVE

## 2012-04-01 LAB — GLUCOSE, CAPILLARY
Glucose-Capillary: 101 mg/dL — ABNORMAL HIGH (ref 70–99)
Glucose-Capillary: 185 mg/dL — ABNORMAL HIGH (ref 70–99)

## 2012-04-01 LAB — CBC
Hemoglobin: 9.8 g/dL — ABNORMAL LOW (ref 13.0–17.0)
MCH: 29.8 pg (ref 26.0–34.0)
MCV: 90 fL (ref 78.0–100.0)
RBC: 3.29 MIL/uL — ABNORMAL LOW (ref 4.22–5.81)
WBC: 12.7 10*3/uL — ABNORMAL HIGH (ref 4.0–10.5)

## 2012-04-01 LAB — PROTIME-INR: Prothrombin Time: 13.3 seconds (ref 11.6–15.2)

## 2012-04-01 MED ORDER — WHITE PETROLATUM GEL
Status: AC
  Administered 2012-04-01: 1
  Filled 2012-04-01: qty 5

## 2012-04-01 MED ORDER — HYDRALAZINE HCL 25 MG PO TABS
25.0000 mg | ORAL_TABLET | Freq: Three times a day (TID) | ORAL | Status: DC
  Administered 2012-04-01 – 2012-04-03 (×5): 25 mg
  Filled 2012-04-01 (×9): qty 1

## 2012-04-01 MED ORDER — RISPERIDONE 1 MG/ML PO SOLN
1.0000 mg | Freq: Every day | ORAL | Status: DC
  Administered 2012-04-01: 1 mg via ORAL
  Filled 2012-04-01 (×2): qty 1

## 2012-04-01 MED ORDER — MIDAZOLAM HCL 5 MG/ML IJ SOLN: 2.0000 mg/h | INTRAMUSCULAR | Status: DC

## 2012-04-01 NOTE — Progress Notes (Signed)
Name: Gregory [name] MRN: [id number] DOB: [birth year]    LOS: 7  Referring Provider:  EDP Reason for Referral:  Acute respiratory failure  PULMONARY / CRITICAL CARE MEDICINE  Brief patient description:  51 yo, hx obesity, presumed OHS, COPD, DM, brought to [facility] ED on 10/8 with hypotension and respiratory distress and intubated for acute respiratory failure and diffuse B infiltrates.   Events Since Admission: 10/8  Brought to [facility] ED on 10/8 in respiratory distress and intubated for B infiltrates, shock 10/9 placed on ARDS 10/9 Positive blood cultures reported 10/10 HD cath placed, CVVH started 10/13 Pressors off 10/14 htn, labetolol  Current Status: Critical  Subjective:  CVVHD continues, tolerating volume removal  Vital Signs: Temp:  [96.4 F (35.8 C)-98.1 F (36.7 C)] 97.4 F (36.3 C) (10/15 0600) Pulse Rate:  [63-92] 64  (10/15 0600) Resp:  [11-43] 22  (10/15 0600) BP: (105-179)/(53-96) 119/78 mmHg (10/15 0600) SpO2:  [88 %-100 %] 100 % (10/15 0600) FiO2 (%):  [40 %] 40 % (10/15 0600) Weight:  [255 lb 1.2 oz (115.7 kg)] 255 lb 1.2 oz (115.7 kg) (10/15 0500)  Intake/Output Summary (Last 24 hours) at 04/01/12 0659 Last data filed at 04/01/12 0600  Gross per 24 hour  Intake 2426.52 ml  Output   3520 ml  Net -1093.48 ml   Physical Examination: General:  Mechanically ventilated Neuro:  Sedated, nonfocal. Agitated with arousal HEENT:  ETT in place. Edema left eye Cardiovascular: RRR Lungs:  Bilateral air entry, diminished bases, coarse throughout  Abdomen:  Soft, nontender,  +BS Musculoskeletal:  Atraumatic Skin:  No rash. Dry skin on feet  Active Problems:  Pneumococcal pneumonia  Acute respiratory failure  Septic shock(785.52)  Diabetes  Hyperglycemia  Acute encephalopathy  Acute renal failure  ASSESSMENT AND PLAN  PULMONARY  Lab 03/31/12 0526 03/30/12 0416 03/29/12 2130 03/29/12 1608 03/29/12 0200  PHART 7.295* 7.259* 7.272* 7.297* 7.247*  PCO2ART 53.0*  54.7* 54.0* 48.6* 50.9*  PO2ART 79.0* 79.1* 71.0* 74.0* 96.7  HCO3 26.0* 23.7 25.0* 24.0 21.4  O2SAT 94.0 92.6 92.0 94.0 98.0   Ventilator Settings: Vent Mode:  [-] PRVC FiO2 (%):  [40 %] 40 % Set Rate:  [30 bmp] 30 bmp Vt Set:  [470 mL] 470 mL PEEP:  [5 cmH20-8 cmH20] 5 cmH20 Plateau Pressure:  [20 cmH20-28 cmH20] 20 cmH20  CXR:  10/14> Improved aeration. Slight improvement B infiltrates, LLL continues to be more focally consolidated ETT:  10/8 >>> Bedside u/s for eval of L pleural effusion: 10/11>> Only small amount of fluid. Not enough to sample  A:  Acute respiratory failure.  Pneumonia (CAP vs. Viral) +/- ARDS.  Less likely pulmonary edema.  Suspected COPD (without evidence of exacerbation) - smokes 2-3 packs of cigarettes per day. P:   - ARDS protocol 10/9. Cont Vt at 6cc/kg - abg now, assess need reduction MV then possible SBT - Con't combivent scheduled - Abx as below (changed 03/28/12) - Con't volume removal per CVVHD now that pressors off, based on CVP goals for ARDS Consider to HD, see renal - Neg balance goal - Continue percussion therapy  CARDIOVASCULAR  Lab 03/27/12 1600 03/26/12 0737 03/26/12 0736 03/26/12 0137 03/26/12 0136 03/25/12 2007 03/25/12 2001 03/25/12 1648 03/25/12 1647  TROPONINI 0.77* 1.94* -- 1.98* -- -- 0.61* -- --  LATICACIDVEN -- -- 1.2 -- 1.7 4.6* -- -- 5.7*  PROBNP -- -- -- -- -- -- -- 7610.0* --   ECG:  10/15 NSR on monitor, rate controlled in 60's  Lines: L IJ TLC 10/8 >>> R HD Cath 10/10>> A line L radial 10/13>> TTE: Mild LVH. Systolic function was normal. The estimated ejection fraction was in the range of 50% to 55%.  A: Septic Shock.  Less likely acute systolic congestive heart failure with cardiogenic shock.  NSTEMI likely secondary to shock, hypoxemia. - Troponin plateau and trending down Afib 10/9 >> started on amio P:  - Amiodarone for Afib PO per tube - Now hypertensive. Started on Labetalol 5mg  q2 - Cortisol level 11 >>  hydrocort initiated 10/9. Taper started 10/14 -consider addition oral hydralazine  RENAL  Lab 04/01/12 0440 03/31/12 0400 03/30/12 1830 03/30/12 0410 03/29/12 1600 03/28/12 1600 03/28/12 0240 03/27/12 0500 03/26/12 0442  NA 139 135 138 136 136 -- -- -- --  K 4.1 4.6 -- -- -- -- -- -- --  CL 105 100 102 100 101 -- -- -- --  CO2 24 23 23 23 23  -- -- -- --  BUN 57* 55* 54* 55* 57* -- -- -- --  CREATININE 2.29* 2.60* 2.77* 3.02* 3.41* -- -- -- --  CALCIUM 8.4 8.2* 8.4 8.2* 7.7* -- -- -- --  MG -- 2.8* -- 2.7* -- -- 2.1 1.7 1.4*  PHOS -- 5.4* -- 5.3* 4.7* 5.8* 6.8* -- --   Intake/Output      10/14 0701 - 10/15 0700   I.V. (mL/kg) 1880.4 (16.3)   NG/GT 420   IV Piggyback 50   Total Intake(mL/kg) 2350.4 (20.3)   Urine (mL/kg/hr) 375 (0.1)   Other 2938   Stool 1   Total Output 3314   Net -963.6        Foley:  10/8 >>>  A:  Dehydration.  ARF likely ATN Hypomagnesemia Hypophosphatemia>>hyperphosphatemia Hyperkalemia- Resolved Started on CVVH 10/10  P:   - Continue CVVH per renal. BP improved, can consider HD - Continue volume removal  - Monitor UOP, still oliguric  - Goal CVP 10 to12.  - Trend BMet  GASTROINTESTINAL  Lab 04/01/12 0440 03/31/12 0400 03/30/12 1830 03/30/12 0410 03/29/12 1600 03/29/12 0308  AST 43* 54* 58* 79* -- 125*  ALT 67* 85* 86* 100* -- 112*  ALKPHOS 97 108 110 115 -- 95  BILITOT 0.3 0.3 0.3 0.3 -- 0.3  PROT 6.0 6.2 6.2 6.3 -- 5.8*  ALBUMIN 2.4* 2.4* 2.3* 2.3* 2.1* --   A:  Suspected malnutrition. LFT elevation P:   - LFTs have plateaued and trending down - TF advanced 10/14, tolerating  HEMATOLOGIC  Lab 04/01/12 0440 03/31/12 0400 03/30/12 0410 03/29/12 0308 03/28/12 0240 03/27/12 1000  HGB 9.8* 10.1* 10.0* 9.2* 10.3* --  HCT 29.6* 31.5* 31.4* 28.3* 30.7* --  PLT 131* 104* 96* 104* 127* --  INR 1.02 -- -- 1.08 1.14 1.16  APTT -- -- -- -- -- --   A:  Thrombocytopenia, etiology unclear. ? Heparin, ? Other meds P:  - No heparin, place  SCD - HITT panel 10/13 in process but HITT unlikely as platelets are improving - Trend CBC   INFECTIOUS  Lab 04/01/12 0440 03/31/12 0400 03/30/12 0410 03/29/12 0308 03/28/12 0240 03/27/12 0500 03/26/12 0442 03/25/12 1647  WBC 12.7* 9.7 8.7 4.4 10.2 -- -- --  PROCALCITON -- 2.86 -- -- -- 40.71 39.19 80.11   Cultures: 10/8 urine>>no growth 10/8  Blood >>> GPC in pairs >> Strep pneumo 10/8  Respiratory >>> Strep pneumo 10/9 nasal influ>> neg  Antibiotics: Azithromycin 10/8 x 1 Ceftriaxone 10/8 x 1 Zosyn 10/8 >>> 10/11  Vancomycin 10/8 >>> 10/11 Levaquin 10/8 (has best MIC vs pneumococcus at this hospital, higher than ceftriaxone) >>>  Tamiflu 10/9>>10/10  A:  Pneumonia (CAP vs aspiration),  pneumococcal PNA/sepsis P:   - Antibiotics / cultures as above, add stop date 04/04/12 for total 10 days - Trend PCT> stable  ENDOCRINE  Lab 04/01/12 0444 04/01/12 0009 03/31/12 2011 03/31/12 1549 03/31/12 1214  GLUCAP 149* 185* 122* 135* 148*   A:  DM. P:   - ICU glycemic control protocol  NEUROLOGIC  A:  Acute encephalopathy.  History of diabetic neuropathy. P:   - Goal RASS 0 to -1 - Daily WUA - Con't Propofol and fentanyl gtt - Con't Versed scheduled Likely will need trach  BEST PRACTICE / DISPOSITION Level of Care:  ICU Primary Service:  PCCM Consultants:  None Code Status:  Full Diet:  NPO- tube feeds at goal DVT Px:  SCD GI Px:  Protonix Skin Integrity:  Intact Social / Family: Will contact daughters, I did update her 10/14  Amber M. Hairford, M.D. 04/01/2012 6:59 AM   Ccm time 30 min   I have fully examined this patient and agree with above findings.    And edited in full  Mcarthur Rossetti. Tyson Alias, MD, FACP Pgr: [phone] [facility]

## 2012-04-01 NOTE — Care Management Note (Signed)
    Page 1 of 2   04/11/2012     4:17:50 PM   CARE MANAGEMENT NOTE 04/11/2012  Patient:  Gregory [name], Gregory [name]   Account Number:  [id number]  Date Initiated:  03/26/2012  Documentation initiated by:  [facility]  Subjective/Objective Assessment:   Admitted from home with resp failure.  Lives alone     Action/Plan:   Anticipated DC Date:  04/05/2012   Anticipated DC Plan:  SKILLED NURSING FACILITY      DC Planning Services  CM consult      Choice offered to / List presented to:             Status of service:  In process, will continue to follow Medicare Important Message given?   (If response is "NO", the following Medicare IM given date fields will be blank) Date Medicare IM given:   Date Additional Medicare IM given:    Discharge Disposition:    Per UR Regulation:  Reviewed for med. necessity/level of care/duration of stay  If discussed at Long Length of Stay Meetings, dates discussed:   04/03/2012  04/08/2012  04/10/2012    Comments:  Contact:  Camacho,[name] Daughter     [phone]                 Hannig,Megan Daughter [phone]                 Rayle,Tammie Significant other [phone]  04-11-12 4pm Avie Arenas, RNBSN CIR states not progressing enough for now - will f/u on Monday.  [facility] will accept with Letter of guantee for SNF post discharge if still needs. SW notified.  Transferred to 6700 today. Staff to notify daughter.  CM will continue to follow on Monday.  04-09-12 12noon Avie Arenas, RNBSN [phone] AMS - CT - ?? stroke - will need MRI - neuro consulting. Awaiting [facility] decision for admission.  CIR backup.  40 % trach collar.  04-04-12 11:30am Avie Arenas, RNBSN [phone] Still [facility] word from [facility] - Renal states probably not need continued dialysis.  04-03-12 1:30pm Johny Shears - [phone] Continues on vent with sedation - trached.  [facility] referral recieved.  Referral made to [facility] due to insurance.  04/01/12 JULIE   AMERSON,RN,BSN [phone] PT REMAINS SEDATED AND ON VENT WITH MULTIPLE DRIPS.  MD NOTES STATE PT WILL LIKELY NEED TRACH.  WILL CONT TO FOLLOW PROGRESS.  03-26-12 2pm Avie Arenas, RNBSN [phone] Lives at home alone.  Has significant other and two daughters.  Patient on vent with sedation at this time.

## 2012-04-01 NOTE — Progress Notes (Signed)
Subjective: remains intubated Agitated when sedation reduced Still getting about 120 cc/hour fluids in with drips, tf's, etc On CRRT with goal of 50-75 cc/hour net negative balance Negative a liter from yesterday Weights do not correlate with UF loss BP's by a-line elevated uop 10-20 cc/hour  Objective Vital signs in last 24 hours: Filed Vitals:   04/01/12 0410 04/01/12 0500 04/01/12 0600 04/01/12 0700  BP: 156/88 124/66 119/78 124/68  Pulse: 72 66 64 65  Temp:  97.8 F (36.6 C) 97.4 F (36.3 C) 96.9 F (36.1 C)  TempSrc:      Resp: 43 25 22 28   Height:      Weight:  115.7 kg (255 lb 1.2 oz)    SpO2: 99% 98% 100% 100%   Weight change: -3.9 kg (-8 lb 9.6 oz)  Intake/Output Summary (Last 24 hours) at 04/01/12 0731 Last data filed at 04/01/12 0700  Gross per 24 hour  Intake 2380.42 ml  Output   3481 ml  Net -1100.58 ml   Physical Exam:  Blood pressure 124/68, pulse 65, temperature 96.9 F (36.1 C), temperature source Other (Comment), resp. rate 28, height 6\' 1"  (1.854 m), weight 115.7 kg (255 lb 1.2 oz), SpO2 100.00%. 04/01/12 0500 115.7 kg (255 lb 1.2 oz) -- -- [facility]  03/31/12 0500 119.6 kg (263 lb 10.7 oz) -- -- [facility]  03/30/12 0400 123.3 kg (271 lb 13.2 oz) -- -- [facility]  03/29/12 0400 127.6 kg (281 lb 4.9 oz) -- -- [facility]  03/28/12 0500 127.8 kg (281 lb 12 oz) Obese pale WM Intubated Persistent generalized anasarca Scleral and facial edema reduced Coarse breath sounds with some anterior crackles S1S2 no S3  Sinus rhythm in the 60's Abdomen obese, protuberant.  Shakes head back and forth with palpation Extremities +1 edema CVP most 12-14 (last 39?????)  Labs: Basic Metabolic Panel:  Lab 04/01/12 9604 03/31/12 0400 03/30/12 1830 03/30/12 0410 03/29/12 1600 03/29/12 0308 03/28/12 1600 03/28/12 0240 03/27/12 0500 03/26/12 0442  NA 139 135 138 136 136 136 135 -- -- --  K 4.1 4.6 4.5 4.6 4.5 4.7 4.8 -- -- --  CL 105 100 102 100 101 100 98 -- -- --  CO2 24 23 23 23 23 21 20  -- --  --  GLUCOSE 161* 187* 162* 144* 97 146* 156* -- -- --  BUN 57* 55* 54* 55* 57* 59* 68* -- -- --  CREATININE 2.29* 2.60* 2.77* 3.02* 3.41* 3.78* 4.53* -- -- --  ALB -- -- -- -- -- -- -- -- -- --  CALCIUM 8.4 8.2* 8.4 8.2* 7.7* 7.4* 7.1* -- -- --  PHOS -- 5.4* -- 5.3* 4.7* -- 5.8* 6.8* 8.2* 6.1*   Liver Function Tests:  Lab 04/01/12 0440 03/31/12 0400 03/30/12 1830  AST 43* 54* 58*  ALT 67* 85* 86*  ALKPHOS 97 108 110  BILITOT 0.3 0.3 0.3  PROT 6.0 6.2 6.2  ALBUMIN 2.4* 2.4* 2.3*   Lab 04/01/12 0440 03/31/12 0400 03/30/12 0410 03/29/12 0308 03/25/12 1647  WBC 12.7* 9.7 8.7 4.4 --  NEUTROABS -- -- -- -- 7.5  HGB 9.8* 10.1* 10.0* 9.2* --  HCT 29.6* 31.5* 31.4* 28.3* --  MCV 90.0 90.8 90.8 90.7 --  PLT 131* 104* 96* 104* --   Lab 03/27/12 1600 03/26/12 0737 03/26/12 0137 03/25/12 2001  CKTOTAL -- -- -- --  CKMB -- -- -- --  CKMBINDEX -- -- -- --  TROPONINI 0.77* 1.94* 1.98* 0.61*   CBG:  Lab 04/01/12 0444  04/01/12 0009 03/31/12 2011 03/31/12 1549 03/31/12 1214  GLUCAP 149* 185* 122* 135* 148*  Medications:  . sodium chloride 20 mL/hr at 03/30/12 1900  . fentaNYL infusion INTRAVENOUS 400 mcg/hr (04/01/12 0406)  . midazolam (VERSED) infusion Stopped (03/31/12 0100)  . dialysis replacement fluid (prismasate) 200 mL/hr at 03/31/12 1821  . dialysis replacement fluid (prismasate) 300 mL/hr at 03/31/12 2033  . dialysate (PRISMASATE) 2,000 mL/hr at 04/01/12 0551  . propofol 45 mcg/kg/min (04/01/12 0600)    . acetylcysteine  4 mL Nebulization Q4H  . albuterol  2.5 mg Nebulization Q4H  . amiodarone  200 mg Per Tube BID  . antiseptic oral rinse  1 application Mouth Rinse QID  . chlorhexidine  15 mL Mouth/Throat BID  . feeding supplement (OXEPA)  1,000 mL Per Tube Q24H  . feeding supplement  60 mL Per Tube TID  . hydrocortisone sod succinate (SOLU-CORTEF) injection  50 mg Intravenous Q8H  . insulin aspart  2-6 Units Subcutaneous Q4H  . ipratropium  0.5 mg Nebulization Q4H  .  levofloxacin (LEVAQUIN) IV  250 mg Intravenous Q24H  . midazolam  1 mg Intravenous Q8H  . pantoprazole sodium  40 mg Per Tube QHS  . sodium chloride  1,000 mL Intravenous Once  . white petrolatum      . DISCONTD: albuterol-ipratropium  6 puff Inhalation Q4H  . DISCONTD: feeding supplement (JEVITY 1.2 CAL)  1,000 mL Per Tube Q24H  . DISCONTD: hydrocortisone sod succinate (SOLU-CORTEF) injection  100 mg Intravenous Q8H  . DISCONTD: midazolam  1 mg Intravenous Q8H   I  have reviewed scheduled and prn medications.  ASSESSMENT/RECOMMENDATIONS   51 yo WM with AKI in the setting of pneumococcal pneumonia/sepsis, acute respiratory failure/ARDS/suspected underlying COPD, septic shock (off pressors), NSTEMI, Afib on amio (SR now), diabetes; most recent baseline creatinine  1.07 in 2010.  AKI - on CRRT since 10/10.   Anasarca improving Tolerated negative 50/hour; effluent dose 22 ml/kg/hour; on prismasate 4K dialysate and replacement fluids;  no heparin; right IJ catheter;  Right IJ catheter.  Goal CVP 10-12 per CCM    Continue with current CRRT prescription with increase in UFR to neg 100 ml/hour UF rate for the next 24 hours - then may try to go to intermittent HD (BP would allow now but still getting fairly large volume of drips so not sure how well we will be able to keep up with intermittent HD)   Camille Bal, MD [facility] [phone] pager 04/01/2012, 7:31 AM

## 2012-04-02 ENCOUNTER — Inpatient Hospital Stay (HOSPITAL_COMMUNITY): Payer: Self-pay

## 2012-04-02 LAB — GLUCOSE, CAPILLARY

## 2012-04-02 LAB — PHOSPHORUS: Phosphorus: 4.5 mg/dL (ref 2.3–4.6)

## 2012-04-02 LAB — COMPREHENSIVE METABOLIC PANEL
ALT: 58 U/L — ABNORMAL HIGH (ref 0–53)
AST: 38 U/L — ABNORMAL HIGH (ref 0–37)
Alkaline Phosphatase: 88 U/L (ref 39–117)
CO2: 24 mEq/L (ref 19–32)
Calcium: 8.3 mg/dL — ABNORMAL LOW (ref 8.4–10.5)
Chloride: 105 mEq/L (ref 96–112)
GFR calc Af Amer: 49 mL/min — ABNORMAL LOW (ref >90)
GFR calc non Af Amer: 42 mL/min — ABNORMAL LOW (ref >90)
Glucose, Bld: 169 mg/dL — ABNORMAL HIGH (ref 70–99)
Potassium: 4.2 mEq/L (ref 3.5–5.1)
Sodium: 137 mEq/L (ref 135–145)
Total Bilirubin: 0.3 mg/dL (ref 0.3–1.2)

## 2012-04-02 LAB — POCT I-STAT 3, ART BLOOD GAS (G3+)
Acid-Base Excess: 3 mmol/L — ABNORMAL HIGH (ref 0.0–2.0)
pCO2 arterial: 46 mmHg — ABNORMAL HIGH (ref 35.0–45.0)
pO2, Arterial: 93 mmHg (ref 80.0–100.0)

## 2012-04-02 LAB — CBC
MCH: 30.2 pg (ref 26.0–34.0)
MCHC: 33.3 g/dL (ref 30.0–36.0)
Platelets: 142 10*3/uL — ABNORMAL LOW (ref 150–400)
RDW: 14.5 % (ref 11.5–15.5)

## 2012-04-02 MED ORDER — HEPARIN SODIUM (PORCINE) 1000 UNIT/ML IJ SOLN
INTRAMUSCULAR | Status: AC
  Administered 2012-04-02: 2.4 mL
  Filled 2012-04-02: qty 3

## 2012-04-02 MED ORDER — METHADONE HCL 10 MG PO TABS
10.0000 mg | ORAL_TABLET | Freq: Three times a day (TID) | ORAL | Status: DC
  Administered 2012-04-02 – 2012-04-03 (×3): 10 mg
  Filled 2012-04-02 (×3): qty 1

## 2012-04-02 MED ORDER — LEVOFLOXACIN IN D5W 500 MG/100ML IV SOLN
500.0000 mg | INTRAVENOUS | Status: AC
  Administered 2012-04-02: 500 mg via INTRAVENOUS
  Filled 2012-04-02: qty 100

## 2012-04-02 MED ORDER — RISPERIDONE 1 MG/ML PO SOLN
2.0000 mg | Freq: Two times a day (BID) | ORAL | Status: DC
  Administered 2012-04-02 – 2012-04-06 (×9): 2 mg via ORAL
  Filled 2012-04-02 (×12): qty 2

## 2012-04-02 MED ORDER — FENTANYL CITRATE 0.05 MG/ML IJ SOLN: 200.0000 ug | Freq: Once | INTRAMUSCULAR | Status: AC

## 2012-04-02 MED ORDER — MIDAZOLAM HCL 2 MG/2ML IJ SOLN: 4.0000 mg | Freq: Once | INTRAMUSCULAR | Status: AC

## 2012-04-02 MED ORDER — VECURONIUM BROMIDE 10 MG IV SOLR
10.0000 mg | Freq: Once | INTRAVENOUS | Status: AC
  Administered 2012-04-03: 10 mg via INTRAVENOUS
  Filled 2012-04-02 (×2): qty 10

## 2012-04-02 MED ORDER — ETOMIDATE 2 MG/ML IV SOLN
40.0000 mg | Freq: Once | INTRAVENOUS | Status: DC
  Filled 2012-04-02: qty 10
  Filled 2012-04-02: qty 20

## 2012-04-02 MED ORDER — METHADONE HCL 10 MG/ML IJ SOLN: 10.0000 mg | Freq: Three times a day (TID) | INTRAMUSCULAR | Status: DC | PRN

## 2012-04-02 MED ORDER — PROPOFOL 10 MG/ML IV EMUL
5.0000 ug/kg/min | Freq: Once | INTRAVENOUS | Status: AC
  Filled 2012-04-02 (×3): qty 100

## 2012-04-02 NOTE — Progress Notes (Signed)
Subjective: remains intubated and sedated CRRT at -100/hour Weights do no correlate with estimate of fluid removal  Objective:    Vital signs in last 24 hours: Filed Vitals:   04/02/12 0300 04/02/12 0400 04/02/12 0443 04/02/12 0500  BP:      Pulse: 75 70  67  Temp: 97.5 F (36.4 C) 97.5 F (36.4 C)  97.3 F (36.3 C)  TempSrc:      Resp: 0 30  30  Height:      Weight:   118.4 kg (261 lb 0.4 oz)   SpO2: 100% 100%     Weight change: 2.7 kg (5 lb 15.2 oz)  Intake/Output Summary (Last 24 hours) at 04/02/12 1610 Last data filed at 04/02/12 0600  Gross per 24 hour  Intake 2883.8 ml  Output   4684 ml  Net -1800.2 ml  Urine output 885/24 hours  Physical Exam:  Blood pressure 129/57, pulse 67, temperature 97.3 F (36.3 C), temperature source Core (Comment), resp. rate 30, height 6\' 1"  (1.854 m), weight 118.4 kg (261 lb 0.4 oz), SpO2 100.00%. CVP 10 Quiet on the vent (heavily sedated) Lungs anteriorly clear Abdomen non distended No substantial LE edema Temp cath HD right IJ (10/10)   Lab 04/02/12 0450 04/01/12 0440 03/31/12 0400 03/30/12 1830 03/30/12 0410 03/29/12 1600 03/29/12 0308 03/28/12 1600 03/28/12 0240 03/27/12 0500  NA 137 139 135 138 136 136 136 -- -- --  K 4.2 4.1 4.6 4.5 4.6 4.5 4.7 -- -- --  CL 105 105 100 102 100 101 100 -- -- --  CO2 24 24 23 23 23 23 21  -- -- --  GLUCOSE 169* 161* 187* 162* 144* 97 146* -- -- --  BUN 43* 57* 55* 54* 55* 57* 59* -- -- --  CREATININE 1.78* 2.29* 2.60* 2.77* 3.02* 3.41* 3.78* -- -- --  ALB -- -- -- -- -- -- -- -- -- --  CALCIUM 8.3* 8.4 8.2* 8.4 8.2* 7.7* 7.4* -- -- --  PHOS 4.5 -- 5.4* -- 5.3* 4.7* -- 5.8* 6.8* 8.2*     Lab 04/02/12 0450 04/01/12 0440 03/31/12 0400  AST 38* 43* 54*  ALT 58* 67* 85*  ALKPHOS 88 97 108  BILITOT 0.3 0.3 0.3  PROT 6.0 6.0 6.2  ALBUMIN 2.3* 2.4* 2.4*    Lab 04/02/12 0450 04/01/12 0440 03/31/12 0400 03/30/12 0410  WBC 9.8 12.7* 9.7 8.7  NEUTROABS -- -- -- --  HGB 9.8* 9.8* 10.1* 10.0*    HCT 29.4* 29.6* 31.5* 31.4*  MCV 90.5 90.0 90.8 90.8  PLT 142* 131* 104* 96*     Lab 04/02/12 0335 04/02/12 0028 04/01/12 1940 04/01/12 1204 04/01/12 0731  GLUCAP 123* 131* 101* 143* 142*    Studies/Results: Dg Chest Port 1 View  04/01/2012  *RADIOLOGY REPORT*  Clinical Data: Evaluate endotracheal tube.  PORTABLE CHEST - 1 VIEW  Comparison: 03/30/2012  Findings: Support devices are unchanged.  Cardiomegaly with bilateral airspace disease, likely edema.  No visible effusions. No acute bony abnormality.  IMPRESSION: No interval change pulmonary edema pattern.   Original Report Authenticated By: Cyndie Chime, M.D.       Medications . sodium chloride 20 mL/hr at 03/30/12 1900  . fentaNYL infusion INTRAVENOUS 400 mcg/hr (04/02/12 0029)  . midazolam (VERSED) infusion 10 mg/hr (04/02/12 0307)  . dialysis replacement fluid (prismasate) 200 mL/hr at 03/31/12 1821  . dialysis replacement fluid (prismasate) 300 mL/hr at 04/01/12 1132  . dialysate (PRISMASATE) 2,000 mL/hr at 04/02/12 0547  .  propofol 40 mcg/kg/min (04/02/12 0033)  . DISCONTD: midazolam (VERSED) infusion 2 mg/hr (04/01/12 1500)   . albuterol  2.5 mg Nebulization Q4H  . amiodarone  200 mg Per Tube BID  . antiseptic oral rinse  1 application Mouth Rinse QID  . chlorhexidine  15 mL Mouth/Throat BID  . feeding supplement (OXEPA)  1,000 mL Per Tube Q24H  . feeding supplement  60 mL Per Tube TID  . hydrALAZINE  25 mg Per Tube Q8H  . hydrocortisone sod succinate (SOLU-CORTEF) injection  50 mg Intravenous Q8H  . insulin aspart  2-6 Units Subcutaneous Q4H  . ipratropium  0.5 mg Nebulization Q4H  . levofloxacin (LEVAQUIN) IV  250 mg Intravenous Q24H  . midazolam  1 mg Intravenous Q8H  . pantoprazole sodium  40 mg Per Tube QHS  . risperiDONE  1 mg Oral QHS  . sodium chloride  1,000 mL Intravenous Once     I  have reviewed scheduled and prn medications.  ASSESSMENT/RECOMMENDATIONS  51 yo WM with AKI in the setting of  pneumococcal pneumonia/sepsis, acute respiratory failure/ARDS/suspected underlying COPD, septic shock (off pressors), NSTEMI, Afib on amio (SR now), diabetes; most recent baseline creatinine 1.07 in 2010.  AKI - on CRRT since 10/10.   Anasarca improved  Starting to make some urine No heparin; right IJ catheter;   Transition (attempt) to intermittent HD); D/C CRRT when current filter runs out May be seeing some early renal recovery BP will be adequate for intermittent hemodialysis (although pt still getting 120 ml/hour fluids) Anticipate trial of HD tomorrow but will wait on AM labs and assessment of UOP before writing orders  Camille Bal, MD [facility] [phone] Pager 04/02/2012, 7:12 AM

## 2012-04-02 NOTE — Progress Notes (Addendum)
ANTIBIOTIC CONSULT NOTE - FOLLOW UP  Pharmacy Consult for Levaquin Indication: PNA  No Known Allergies  Patient Measurements: Height: 6\' 1"  (185.4 cm) Weight: 261 lb 0.4 oz (118.4 kg) IBW/kg (Calculated) : 79.9    Vital Signs: Temp: 97.1 F (36.2 C) (10/16 0800) Pulse Rate: 68  (10/16 0800) Intake/Output from previous day: 10/15 0701 - 10/16 0700 In: 2913.8 [I.V.:2068.8; NG/GT:795; IV Piggyback:50] Out: 4759 [Urine:960] Intake/Output from this shift: Total I/O In: 159.6 [I.V.:99.6; NG/GT:60] Out: 296 [Other:296]  Labs:  [facility] 04/02/12 0450 04/01/12 0440 03/31/12 0400  WBC 9.8 12.7* 9.7  HGB 9.8* 9.8* 10.1*  PLT 142* 131* 104*  LABCREA -- -- --  CREATININE 1.78* 2.29* 2.60*   Estimated Creatinine Clearance: 65.4 ml/min (by C-G formula based on Cr of 1.78). No results found for this basename: VANCOTROUGH:2,VANCOPEAK:2,VANCORANDOM:2,GENTTROUGH:2,GENTPEAK:2,GENTRANDOM:2,TOBRATROUGH:2,TOBRAPEAK:2,TOBRARND:2,AMIKACINPEAK:2,AMIKACINTROU:2,AMIKACIN:2, in the last 72 hours   Microbiology: Recent Results (from the past 720 hour(s))  URINE CULTURE     Status: Normal   Collection Time   03/25/12  4:50 PM      Component Value Range Status Comment   Specimen Description URINE, CATHETERIZED   Final    Special Requests NONE   Final    Culture  Setup Time 03/25/2012 17:58   Final    Colony Count NO GROWTH   Final    Culture NO GROWTH   Final    Report Status 03/26/2012 FINAL   Final   CULTURE, BLOOD (ROUTINE X 2)     Status: Normal   Collection Time   03/25/12  4:55 PM      Component Value Range Status Comment   Specimen Description BLOOD ARM RIGHT   Final    Special Requests     Final    Value: BOTTLES DRAWN AEROBIC AND ANAEROBIC BLUE 10CC RED 5CC   Culture  Setup Time 03/26/2012 01:32   Final    Culture     Final    Value: STREPTOCOCCUS PNEUMONIAE     Note: Gram Stain Report Called to,Read Back By and Verified With: [facility] WHITE @ 1519 03/26/12 BY KRAWS   Report Status  03/28/2012 FINAL   Final    Organism ID, Bacteria STREPTOCOCCUS PNEUMONIAE   Final   CULTURE, BLOOD (ROUTINE X 2)     Status: Normal   Collection Time   03/25/12  5:15 PM      Component Value Range Status Comment   Specimen Description BLOOD HAND RIGHT   Final    Special Requests BOTTLES DRAWN AEROBIC ONLY 4CC   Final    Culture  Setup Time 03/26/2012 01:32   Final    Culture     Final    Value: STREPTOCOCCUS PNEUMONIAE     Note: SUSCEPTIBILITIES PERFORMED ON PREVIOUS CULTURE WITHIN THE LAST 5 DAYS.     Note: Gram Stain Report Called to,Read Back By and Verified With: [facility] WHITE @ 1519 03/26/12 BY KRAWS   Report Status 03/28/2012 FINAL   Final   MRSA PCR SCREENING     Status: Normal   Collection Time   03/25/12  8:58 PM      Component Value Range Status Comment   MRSA by PCR NEGATIVE  NEGATIVE Final   CULTURE, RESPIRATORY     Status: Normal   Collection Time   03/25/12  9:15 PM      Component Value Range Status Comment   Specimen Description TRACHEAL ASPIRATE   Final    Special Requests NONE   Final  Gram Stain     Final    Value: MODERATE WBC PRESENT, PREDOMINANTLY PMN     NO SQUAMOUS EPITHELIAL CELLS SEEN     FEW GRAM POSITIVE RODS     FEW GRAM POSITIVE COCCI IN PAIRS     IN CHAINS RARE GRAM NEGATIVE RODS   Culture     Final    Value: MODERATE STREPTOCOCCUS PNEUMONIAE     FEW CANDIDA ALBICANS   Report Status 03/29/2012 FINAL   Final    Organism ID, Bacteria STREPTOCOCCUS PNEUMONIAE   Final     Anti-infectives     Start     Dose/Rate Route Frequency Ordered Stop   03/28/12 2200   Levofloxacin (LEVAQUIN) IVPB 250 mg        250 mg 50 mL/hr over 60 Minutes Intravenous Every 24 hours 03/28/12 1339 04/04/12 2159   03/28/12 1130   cefTRIAXone (ROCEPHIN) 2 g in dextrose 5 % 50 mL IVPB  Status:  Discontinued        2 g 100 mL/hr over 30 Minutes Intravenous Every 12 hours 03/28/12 1126 03/28/12 1128   03/27/12 2200   vancomycin (VANCOCIN) 1,250 mg in sodium chloride 0.9 % 250  mL IVPB  Status:  Discontinued        1,250 mg 166.7 mL/hr over 90 Minutes Intravenous Every 24 hours 03/27/12 1452 03/28/12 1126   03/27/12 2200   Levofloxacin (LEVAQUIN) IVPB 250 mg  Status:  Discontinued        250 mg 50 mL/hr over 60 Minutes Intravenous Every 24 hours 03/27/12 1740 03/28/12 1126   03/27/12 2200   piperacillin-tazobactam (ZOSYN) IVPB 3.375 g  Status:  Discontinued        3.375 g 12.5 mL/hr over 240 Minutes Intravenous 4 times per day 03/27/12 1740 03/28/12 1126   03/27/12 2030   levofloxacin (LEVAQUIN) IVPB 750 mg  Status:  Discontinued        750 mg 100 mL/hr over 90 Minutes Intravenous Every 48 hours 03/25/12 2007 03/27/12 1740   03/26/12 1600   oseltamivir (TAMIFLU) capsule 75 mg  Status:  Discontinued        75 mg Oral Daily 03/26/12 1442 03/27/12 0824   03/26/12 1400   piperacillin-tazobactam (ZOSYN) IVPB 3.375 g  Status:  Discontinued        3.375 g 12.5 mL/hr over 240 Minutes Intravenous 3 times per day 03/26/12 0907 03/27/12 1740   03/26/12 0230   piperacillin-tazobactam (ZOSYN) IVPB 2.25 g  Status:  Discontinued        2.25 g 100 mL/hr over 30 Minutes Intravenous 4 times per day 03/25/12 2336 03/26/12 0907   03/25/12 2015  piperacillin-tazobactam (ZOSYN) IVPB 2.25 g       2.25 g 100 mL/hr over 30 Minutes Intravenous  Once 03/25/12 2005 03/25/12 2216   03/25/12 2015   vancomycin (VANCOCIN) 2,000 mg in sodium chloride 0.9 % 500 mL IVPB        2,000 mg 250 mL/hr over 120 Minutes Intravenous  Once 03/25/12 2005 03/26/12 0040   03/25/12 1945   levofloxacin (LEVAQUIN) IVPB 750 mg  Status:  Discontinued        750 mg 100 mL/hr over 90 Minutes Intravenous Every 24 hours 03/25/12 1937 03/25/12 2007   03/25/12 1645   cefTRIAXone (ROCEPHIN) 1 g in dextrose 5 % 50 mL IVPB        1 g 100 mL/hr over 30 Minutes Intravenous  Once 03/25/12 1634 03/25/12 1729   03/25/12  1645   azithromycin (ZITHROMAX) 500 mg in dextrose 5 % 250 mL IVPB        500 mg 250 mL/hr  over 60 Minutes Intravenous  Once 03/25/12 1634 03/25/12 1815          Assessment: 51 yo male with pneumococcal PNA and bacteremia on Levaquin (sensitive per cultures). Patient is on CVVH and dose is appropriate. WBC 9.8. Afebrile.  He is on Day #9 of a planned 10 day course of antibiotics.  Azithromycin 10/8 x 1 Ceftriaxone 10/8 x 1 Vanc 10/8  x 1 Vanc 10/10  >> 10/11 Zosyn 10/8 >>10/11 Levaquin 10/10 >>    10/8 Blood x 2 >> 2/2 strep (S to levaquin) 10/8 Urine >> NG 10/8 Trach Asp >> Strep PN. candida   Plan:  -Continue Levaquin 250mg  IV Q24hr -Follow renal function and dialysis plans  Juanita Craver PharmD Candidate 04/02/2012,10:25 AM  I agree Stacey's assessment and plan.  Estella Husk, Pharm.D., BCPS Clinical Pharmacist  Phone [phone] Pager [phone] 04/02/2012, 10:39 AM   CRRT is off due to filter clotting with no plans to resume.  Will adjust Levaquin to 500mg  IV q48h and keep stop date of 10/17.  Madolyn Frieze

## 2012-04-02 NOTE — Progress Notes (Signed)
Name: Gregory [name] MRN: [id number] DOB: April 05, 1961    LOS: 8  Referring Provider:  EDP Reason for Referral:  Acute respiratory failure  PULMONARY / CRITICAL CARE MEDICINE  Brief patient description:  51 yo, hx obesity, presumed OHS, COPD, DM, brought to [facility] ED on 10/8 with hypotension and respiratory distress and intubated for acute respiratory failure and diffuse B infiltrates.   Events Since Admission: 10/8  Brought to [facility] ED on 10/8 in respiratory distress and intubated for B infiltrates, shock 10/9 placed on ARDS 10/9 Positive blood cultures reported 10/10 HD cath placed, CVVH started 10/13 Pressors off 10/14 htn, labetolol  Current Status: Critical  Subjective:  CVVHD continues, tolerating volume removal. Getting chest PT  Vital Signs: Temp:  [96.6 F (35.9 C)-97.9 F (36.6 C)] 97.3 F (36.3 C) (10/16 0500) Pulse Rate:  [62-81] 67  (10/16 0500) Resp:  [0-30] 30  (10/16 0500) BP: (123-187)/(55-86) 129/57 mmHg (10/15 2225) SpO2:  [94 %-100 %] 100 % (10/16 0400) FiO2 (%):  [40 %] 40 % (10/16 0400) Weight:  [261 lb 0.4 oz (118.4 kg)] 261 lb 0.4 oz (118.4 kg) (10/16 0443)  Intake/Output Summary (Last 24 hours) at 04/02/12 0717 Last data filed at 04/02/12 0600  Gross per 24 hour  Intake 2883.8 ml  Output   4684 ml  Net -1800.2 ml   Physical Examination: General:  Mechanically ventilated, getting chest percussion Neuro:  Sedated, nonfocal. Agitated with arousal HEENT:  ETT in place. Edema left eye Cardiovascular: RRR Lungs:  Bilateral air entry, diminished bases, coarse throughout  Abdomen:  Soft, nontender,  +BS Musculoskeletal:  Atraumatic Skin:  No rash. Dry skin on feet  Active Problems:  Pneumococcal pneumonia  Acute respiratory failure  Septic shock(785.52)  Diabetes  Hyperglycemia  Acute encephalopathy  Acute renal failure  ASSESSMENT AND PLAN  PULMONARY  Lab 04/02/12 0457 04/01/12 1342 03/31/12 0526 03/30/12 0416 03/29/12 2130  PHART 7.395 7.352  7.295* 7.259* 7.272*  PCO2ART 46.0* 48.3* 53.0* 54.7* 54.0*  PO2ART 93.0 101.0* 79.0* 79.1* 71.0*  HCO3 28.3* 27.0* 26.0* 23.7 25.0*  O2SAT 97.0 98.0 94.0 92.6 92.0   Ventilator Settings: Vent Mode:  [-] PRVC FiO2 (%):  [40 %] 40 % Set Rate:  [30 bmp] 30 bmp Vt Set:  [470 mL] 470 mL PEEP:  [5 cmH20] 5 cmH20 Plateau Pressure:  [19 cmH20] 19 cmH20  CXR:  10/16> Slightly improved aeration. Bilateral infiltrates, LLL continues to be more focally consolidated ETT:  10/8 >>> Bedside u/s for eval of L pleural effusion: 10/11>> Only small amount of fluid. Not enough to sample  A:  Acute respiratory failure.  Pneumonia (CAP vs. Viral) +/- ARDS.  Less likely pulmonary edema.  Suspected COPD (without evidence of exacerbation) - smokes 2-3 packs of cigarettes per day. P:   - ARDS protocol 10/9. Cont Vt at 6cc/kg ( ), abg reviewed, maintain same O2 needs, reduce rate 24 - wean attempt sbt , failed RSBI - Con't combivent scheduled - Abx as below (changed 03/28/12) - Con't volume removal per CVVHD now that pressors off, based on CVP goals for ARDS - Neg balance goal. Possible change to intermittent HD, pcxr improved after last 24 hrs neg 2 liters - Continue percussion therapy - Likely will need trach. Will discuss with family for am  -combination ARDS, failed weaning, severe agitation would benefit trach  CARDIOVASCULAR  Lab 03/27/12 1600 03/26/12 0737 03/26/12 0736  TROPONINI 0.77* 1.94* --  LATICACIDVEN -- -- 1.2  PROBNP -- -- --  ECG:  10/16 NSR on monitor, rate controlled in 60's Lines: L IJ TLC 10/8 >>> R HD Cath 10/10>> A line L radial 10/13>> TTE: Mild LVH. Systolic function was normal. The estimated ejection fraction was in the range of 50% to 55%.  A: Septic Shock.  Less likely acute systolic congestive heart failure with cardiogenic shock.  NSTEMI likely secondary to shock, hypoxemia. - Troponin plateau and trending down Afib 10/9 >> started on amio P:  - Amiodarone for  Afib PO per tube 200 bid, consider dc in 4-7 days if remain sin SR - Now hypertensive. Continue Hydralazine 25mg  TID with Labetalol 5mg  q2 prn - Cortisol level 11 >> hydrocort initiated 10/9. Taper started 10/14. Will reduce dose today  RENAL  Lab 04/02/12 0450 04/01/12 0440 03/31/12 0400 03/30/12 1830 03/30/12 0410 03/29/12 1600 03/28/12 1600 03/28/12 0240 03/27/12 0500  NA 137 139 135 138 136 -- -- -- --  K 4.2 4.1 -- -- -- -- -- -- --  CL 105 105 100 102 100 -- -- -- --  CO2 24 24 23 23 23  -- -- -- --  BUN 43* 57* 55* 54* 55* -- -- -- --  CREATININE 1.78* 2.29* 2.60* 2.77* 3.02* -- -- -- --  CALCIUM 8.3* 8.4 8.2* 8.4 8.2* -- -- -- --  MG -- -- 2.8* -- 2.7* -- -- 2.1 1.7  PHOS 4.5 -- 5.4* -- 5.3* 4.7* 5.8* -- --   Intake/Output      10/15 0701 - 10/16 0700 10/16 0701 - 10/17 0700   I.V. (mL/kg) 2068.8 (17.5)    NG/GT 765    IV Piggyback 50    Total Intake(mL/kg) 2883.8 (24.4)    Urine (mL/kg/hr) 885 (0.3)    Other 3799    Stool 0    Total Output 4684    Net -1800.2         Stool Occurrence 1 x     Foley:  10/8 >>>  A:  Dehydration.  ARF likely ATN Hypomagnesemia>>resolved Hypophosphatemia>>hyperphosphatemia Hyperkalemia- Resolved Started on CVVH 10/10  P:   - Continue CVVH with consideration of intermittent HD since BP can tolerate - Continue volume removal as pcxr improved with this - Monitor UOP, improved some  - Goal CVP 10 to12.  - Trend BMet  GASTROINTESTINAL  Lab 04/02/12 0450 04/01/12 0440 03/31/12 0400 03/30/12 1830 03/30/12 0410  AST 38* 43* 54* 58* 79*  ALT 58* 67* 85* 86* 100*  ALKPHOS 88 97 108 110 115  BILITOT 0.3 0.3 0.3 0.3 0.3  PROT 6.0 6.0 6.2 6.2 6.3  ALBUMIN 2.3* 2.4* 2.4* 2.3* 2.3*   A:  Suspected malnutrition. LFT elevation P:   - LFTs have plateaued and trending down - TF advanced to goal 10/14, tolerating -Bm noted 10/15  HEMATOLOGIC  Lab 04/02/12 0450 04/01/12 0440 03/31/12 0400 03/30/12 0410 03/29/12 0308 03/28/12 0240  03/27/12 1000  HGB 9.8* 9.8* 10.1* 10.0* 9.2* -- --  HCT 29.4* 29.6* 31.5* 31.4* 28.3* -- --  PLT 142* 131* 104* 96* 104* -- --  INR -- 1.02 -- -- 1.08 1.14 1.16  APTT -- -- -- -- -- -- --   HIT neg  A:  Thrombocytopenia, etiology unclear P:  - No further fib, likely trach in am , hold any anticoagulation - Platelets are improving - Trend CBC   INFECTIOUS  Lab 04/02/12 0450 04/01/12 0440 03/31/12 0400 03/30/12 0410 03/29/12 0308 03/27/12 0500  WBC 9.8 12.7* 9.7 8.7 4.4 --  PROCALCITON -- --  2.86 -- -- 40.71   Cultures: 10/8 urine>>no growth 10/8  Blood >>> GPC in pairs >> Strep pneumo 10/8  Respiratory >>> Strep pneumo 10/9 nasal influ>> neg  Antibiotics: Azithromycin 10/8 x 1 Ceftriaxone 10/8 x 1 Zosyn 10/8 >>> 10/11 Vancomycin 10/8 >>> 10/11 Levaquin 10/8 (has best MIC vs pneumococcus at this hospital, higher than ceftriaxone) >>>  Tamiflu 10/9>>10/10  A:  Pneumonia (CAP vs aspiration),  pneumococcal PNA/sepsis P:   - Antibiotics / cultures as above, add stop date 04/04/12 for total 10 days - Trend PCT> stable  ENDOCRINE  Lab 04/02/12 0335 04/02/12 0028 04/01/12 1940 04/01/12 1204 04/01/12 0731  GLUCAP 123* 131* 101* 143* 142*   A:  DM. P:   - ICU glycemic control protocol  NEUROLOGIC  A:  Acute encephalopathy.  History of diabetic neuropathy. P:   - Goal RASS 0 to -1, diffuclt - Daily WUA - Con't Propofol and fentanyl gtt, wean as tolerated - Con't Versed scheduled -Massive sedation needs, Risperdal to 2 q12h -add methadone 10 mg q8h if ecg qtc (daily) less 500 -add in am likely benzo orally  BEST PRACTICE / DISPOSITION Level of Care:  ICU Primary Service:  PCCM Consultants:  None Code Status:  Full Diet:  NPO- tube feeds at goal DVT Px:  SCD GI Px:  Protonix Skin Integrity:  Intact Social / Family: Daughters are points of contact. Updated at bedside and trach consent  Amber M. Hairford, M.D. 04/02/2012 7:17 AM   I have fully examined this  patient and agree with above findings.    And edited i nfull  Ccm time 30 min   Mcarthur Rossetti. Tyson Alias, MD, FACP Pgr: [phone] [facility]

## 2012-04-03 ENCOUNTER — Inpatient Hospital Stay (HOSPITAL_COMMUNITY): Payer: 59

## 2012-04-03 LAB — COMPREHENSIVE METABOLIC PANEL
ALT: 46 U/L (ref 0–53)
AST: 30 U/L (ref 0–37)
Albumin: 2.3 g/dL — ABNORMAL LOW (ref 3.5–5.2)
CO2: 24 mEq/L (ref 19–32)
Calcium: 8.4 mg/dL (ref 8.4–10.5)
GFR calc non Af Amer: 27 mL/min — ABNORMAL LOW (ref >90)
Sodium: 140 mEq/L (ref 135–145)

## 2012-04-03 LAB — CBC
HCT: 26.7 % — ABNORMAL LOW (ref 39.0–52.0)
Hemoglobin: 9 g/dL — ABNORMAL LOW (ref 13.0–17.0)
MCH: 30.9 pg (ref 26.0–34.0)
MCHC: 33.7 g/dL (ref 30.0–36.0)

## 2012-04-03 LAB — BASIC METABOLIC PANEL
BUN: 67 mg/dL — ABNORMAL HIGH (ref 6–23)
Creatinine, Ser: 2.75 mg/dL — ABNORMAL HIGH (ref 0.50–1.35)
GFR calc Af Amer: 29 mL/min — ABNORMAL LOW (ref >90)
GFR calc non Af Amer: 25 mL/min — ABNORMAL LOW (ref >90)
Potassium: 4.8 mEq/L (ref 3.5–5.1)

## 2012-04-03 LAB — APTT: aPTT: 29 seconds (ref 24–37)

## 2012-04-03 LAB — GLUCOSE, CAPILLARY: Glucose-Capillary: 150 mg/dL — ABNORMAL HIGH (ref 70–99)

## 2012-04-03 LAB — TRIGLYCERIDES: Triglycerides: 899 mg/dL — ABNORMAL HIGH (ref <150)

## 2012-04-03 LAB — RENAL FUNCTION PANEL
Albumin: 1.6 g/dL — ABNORMAL LOW (ref 3.5–5.2)
Calcium: 6.4 mg/dL — CL (ref 8.4–10.5)
Creatinine, Ser: 2.2 mg/dL — ABNORMAL HIGH (ref 0.50–1.35)
GFR calc non Af Amer: 33 mL/min — ABNORMAL LOW (ref >90)
Phosphorus: 5.6 mg/dL — ABNORMAL HIGH (ref 2.3–4.6)

## 2012-04-03 MED ORDER — LEVOFLOXACIN IN D5W 500 MG/100ML IV SOLN
500.0000 mg | INTRAVENOUS | Status: AC
  Administered 2012-04-04: 500 mg via INTRAVENOUS
  Filled 2012-04-03: qty 100

## 2012-04-03 MED ORDER — SODIUM CHLORIDE 0.9 % IV SOLN
20.0000 ug | Freq: Once | INTRAVENOUS | Status: AC
  Administered 2012-04-03: 20 ug via INTRAVENOUS
  Filled 2012-04-03: qty 5

## 2012-04-03 MED ORDER — AMIODARONE HCL 200 MG PO TABS
200.0000 mg | ORAL_TABLET | Freq: Every day | ORAL | Status: DC
  Administered 2012-04-04 – 2012-04-08 (×5): 200 mg
  Filled 2012-04-03 (×6): qty 1

## 2012-04-03 MED ORDER — HYDROCORTISONE SOD SUCCINATE 100 MG IJ SOLR
25.0000 mg | Freq: Three times a day (TID) | INTRAMUSCULAR | Status: DC
  Administered 2012-04-03: 10:00:00 via INTRAVENOUS
  Administered 2012-04-03 – 2012-04-04 (×3): 25 mg via INTRAVENOUS
  Filled 2012-04-03 (×7): qty 0.5

## 2012-04-03 MED ORDER — LORAZEPAM 2 MG/ML IJ SOLN
2.0000 mg | Freq: Three times a day (TID) | INTRAMUSCULAR | Status: DC
  Administered 2012-04-03 – 2012-04-04 (×3): 2 mg via INTRAVENOUS
  Filled 2012-04-03 (×3): qty 1

## 2012-04-03 MED ORDER — HYDRALAZINE HCL 25 MG PO TABS
25.0000 mg | ORAL_TABLET | Freq: Three times a day (TID) | ORAL | Status: DC
  Administered 2012-04-03 – 2012-04-15 (×35): 25 mg
  Filled 2012-04-03 (×40): qty 1

## 2012-04-03 MED ORDER — METHADONE HCL 10 MG PO TABS
15.0000 mg | ORAL_TABLET | Freq: Three times a day (TID) | ORAL | Status: DC
  Administered 2012-04-03 – 2012-04-04 (×3): 15 mg
  Filled 2012-04-03 (×2): qty 2
  Filled 2012-04-03 (×2): qty 1

## 2012-04-03 MED ORDER — SODIUM POLYSTYRENE SULFONATE 15 GM/60ML PO SUSP
15.0000 g | Freq: Once | ORAL | Status: AC
  Administered 2012-04-03: 15 g
  Filled 2012-04-03: qty 60

## 2012-04-03 NOTE — Progress Notes (Signed)
I was unable to get a good CVP waveform at 1600. Attempted aspiration and was unsuccessful.  RT also attempted without success

## 2012-04-03 NOTE — Progress Notes (Signed)
CRITICAL VALUE ALERT  Critical value received:  Calcium =6.4  Date of notification:  04/03/12  Time of notification:  1745  Critical value read back:yes  Nurse who received alert:  Milas Hock  MD notified (1st page):  Dr. Caryn Section  Time of first page:  1750  MD notified (2nd page):  Time of second page:  Responding MD:  Dr. Caryn Section  Time MD responded:  1800

## 2012-04-03 NOTE — Op Note (Signed)
NAMECHRISTOHPER, Gregory [name] NO.:  [id number]  MEDICAL RECORD NO.:  [id number]  LOCATION:  2115                         FACILITY:  [facility]  PHYSICIAN:  Nelda Bucks, MD DATE OF BIRTH:  1961-04-06  DATE OF PROCEDURE: DATE OF DISCHARGE:                              OPERATIVE REPORT   PROCEDURE:  Percutaneous tracheostomy.  PREOPERATIVE DIAGNOSES:  Severe adult respiratory distress syndrome with strep pneumonia, severe agitation, high sedation needs, severe dyssynchrony.  POSTOPERATIVE DIAGNOSIS:  Status post tracheostomy secondary to a severe adult respiratory distress syndrome and above diagnoses.  Consent was obtained from the patient's daughter, over the age 3. Fully aware of risks and benefits of the procedures including infection, pneumothorax, bleeding and death.  BRONCHOSCOPIST FOR THE PROCEDURE:  Dr. Jefm Miles.  FIRST ASSISTANT:  Zenia Resides ACNP.  First assistant for the procedure for tracheostomy is Dr. Rodman Pickle, resident.  The patient was placed in supine position.  Chlorhexidine preparation was used to sterilize the operative site, 20 mcg of DDAVP was given prior to procedure with some uremia concerns.  Coagulation panel was within normal limits and platelet count was over 100,000. Bronchoscopist placed the bronchoscope through the endotracheal tube and backed up to approximately 18 cm.  After chlorhexidine preparation, I injected 6 mL of lidocaine plus epinephrine over the 2nd endotracheal space.  A 1.2 cm vertical incision was made and dissection made to significant fat pad.  I was able to identify the strap muscles and all the way down to the tracheal planes.  I then placed an 18-gauge needle over white catheter sheath into the airway successfully without any posterior wall injury.  White catheter sheath remained.  Needle was removed.  I then placed a wire through the white catheter sheath and white catheter sheath was removed.  I  then placed a 14-French punch dilator over the wire in and out.  Then placed a progressive rhino dilator over a glider approximately 37-French in and out of the airway. Everything was removed except for the wire and glider.  Then I placed a size 8 tracheostomy over a 20-French dilator over the glider and wire successfully in and out of the airway.  Everything was removed except for the tracheostomy.  Bronchoscopist then placed the bronchoscope through the new tracheostomy near the carina approximately 4 cm below without posterior wall injury or bleeding.  Blood loss for the procedure was less than 1 mL.  The patient was already on continuous propofol, Versed and fentanyl for such severe agitation and did require vecuronium of 10 mg preprocedure.  Postoperative chest x-ray revealed a well-placed tracheostomy, no apparent complications.  Daughter was called personally by me to let her know the procedure had gone relatively well pending chest x-ray.     Nelda Bucks, MD     DJF/MEDQ  D:  04/03/2012  T:  04/03/2012  Job:  [id number]

## 2012-04-03 NOTE — Procedures (Signed)
Bronchoscopy Procedure Note Gregory [name] [id number] [birth year]  Procedure: Bronchoscopy Indications: Diagnostic evaluation of the airways  Procedure Details Consent: Risks of procedure as well as the alternatives and risks of each were explained to the (patient/caregiver).  Consent for procedure obtained. Time Out: Verified patient identification, verified procedure, site/side was marked, verified correct patient position, special equipment/implants available, medications/allergies/relevent history reviewed, required imaging and test results available.  Performed  In preparation for procedure, patient was given 100% FiO2 and bronchoscope lubricated. Sedation: Benzodiazepines, Muscle relaxants and Etomidate  Airway entered and the following bronchi were examined: RUL, RML, RLL, LUL, LLL and Bronchi.   Procedures performed: Brushings performed Bronchoscope removed.    Evaluation Hemodynamic Status: BP stable throughout; O2 sats: stable throughout Patient's Current Condition: stable Specimens:  None Complications: No apparent complications Patient did tolerate procedure well.   YACOUB,WESAM 04/03/2012

## 2012-04-03 NOTE — Progress Notes (Signed)
Hyperkalemia; now off dialysis   Kayexalate given.

## 2012-04-03 NOTE — Progress Notes (Signed)
Name: Gregory [name] MRN: [id number] DOB: 11/24/1960    LOS: 9  Referring Provider:  EDP Reason for Referral:  Acute respiratory failure  PULMONARY / CRITICAL CARE MEDICINE  Brief patient description:  51 yo, hx obesity, presumed OHS, COPD, DM, brought to [facility] ED on 10/8 with hypotension and respiratory distress and intubated for acute respiratory failure and diffuse B infiltrates.   Events Since Admission: 10/8  Brought to [facility] ED on 10/8 in respiratory distress and intubated for B infiltrates, shock 10/9 placed on ARDS 10/9 Positive blood cultures reported 10/10 HD cath placed, CVVH started 10/13 Pressors off 10/14 htn, labetolol 10/16 CVVH off  Current Status: Critical  Subjective:  Off CVVH. Requires high sedation for agitation  Vital Signs: Temp:  [97.1 F (36.2 C)-100.3 F (37.9 C)] 99.7 F (37.6 C) (10/17 0600) Pulse Rate:  [68-98] 79  (10/17 0600) Resp:  [19-30] 25  (10/17 0600) BP: (105-107)/(50-51) 105/50 mmHg (10/17 0400) SpO2:  [92 %-100 %] 97 % (10/17 0600) FiO2 (%):  [40 %] 40 % (10/17 0600) Weight:  [256 lb 13.4 oz (116.5 kg)] 256 lb 13.4 oz (116.5 kg) (10/17 0500)  Intake/Output Summary (Last 24 hours) at 04/03/12 0707 Last data filed at 04/03/12 0600  Gross per 24 hour  Intake 2887.8 ml  Output   1936 ml  Net  951.8 ml   Physical Examination: General:  Mechanically ventilated Neuro:  Sedated, nonfocal. Agitated with arousal HEENT:  ETT in place. Edema left eye Cardiovascular: RRR Lungs:  Bilateral air entry, diminished bases, coarse throughout  Abdomen:  Soft, nontender,  +BS Musculoskeletal:  Atraumatic Skin:  No rash. Dry skin on feet  Active Problems:  Pneumococcal pneumonia  Acute respiratory failure  Septic shock(785.52)  Diabetes  Hyperglycemia  Acute encephalopathy  Acute renal failure  ASSESSMENT AND PLAN  PULMONARY  Lab 04/02/12 0457 04/01/12 1342 03/31/12 0526 03/30/12 0416 03/29/12 2130  PHART 7.395 7.352 7.295* 7.259* 7.272*    PCO2ART 46.0* 48.3* 53.0* 54.7* 54.0*  PO2ART 93.0 101.0* 79.0* 79.1* 71.0*  HCO3 28.3* 27.0* 26.0* 23.7 25.0*  O2SAT 97.0 98.0 94.0 92.6 92.0   Ventilator Settings: Vent Mode:  [-] PRVC FiO2 (%):  [40 %] 40 % Set Rate:  [30 bmp] 30 bmp Vt Set:  [470 mL] 470 mL PEEP:  [5 cmH20] 5 cmH20 Plateau Pressure:  [19 cmH20-22 cmH20] 22 cmH20  CXR:  10/17> Mostly unchanged. Bilateral infiltrates, LLL continues to be more focally consolidated ETT:  10/8 >>> Bedside u/s for eval of L pleural effusion: 10/11>> Only small amount of fluid. Not enough to sample  A:  Acute respiratory failure.  Pneumonia (CAP vs. Viral) +/- ARDS.  Less likely pulmonary edema.  Suspected COPD (without evidence of exacerbation) - smokes 2-3 packs of cigarettes per day. P:   - ARDS protocol 10/9. Cont Vt at 6cc/kg ( ), abg reviewed, maintain same O2 needs - reduce rate if tolerated, pos trach, to 24 - Con't combivent scheduled - Abx as below (changed 03/28/12; will stop 10/18) - Neg balance goal not met. Possible change to intermittent HD per renal - dc percussion therapy - Combination ARDS, failed weaning, severe agitation would benefit trach. Planned for 10/17 - this am can consider weaning attempt repeat  CARDIOVASCULAR  Lab 03/27/12 1600  TROPONINI 0.77*  LATICACIDVEN --  PROBNP --   ECG:  10/16 NSR on monitor, rate controlled in 60's Lines: L IJ TLC 10/8 >>> R HD Cath 10/10>> A line L radial 10/13>> TTE: Mild LVH.  Systolic function was normal. The estimated ejection fraction was in the range of 50% to 55%.  A: Septic Shock.  Less likely acute systolic congestive heart failure with cardiogenic shock.  NSTEMI likely secondary to shock, hypoxemia. - Troponin plateau  Afib 10/9 >> started on amio P:  - Amiodarone for Afib PO per tube 200 bid, consider 04/06/12 days if remains sinus, dro pamio to qday - Continue Hydralazine 25mg  TID with Labetalol 5mg  q2 prn with hold parameters - Cortisol level 11  >> hydrocort initiated 10/9. Taper started 10/14  RENAL  Lab 04/03/12 0440 04/02/12 0450 04/01/12 0440 03/31/12 0400 03/30/12 1830 03/30/12 0410 03/29/12 1600 03/28/12 1600 03/28/12 0240  NA 140 137 139 135 138 -- -- -- --  K 5.1 4.2 -- -- -- -- -- -- --  CL 106 105 105 100 102 -- -- -- --  CO2 24 24 24 23 23  -- -- -- --  BUN 64* 43* 57* 55* 54* -- -- -- --  CREATININE 2.60* 1.78* 2.29* 2.60* 2.77* -- -- -- --  CALCIUM 8.4 8.3* 8.4 8.2* 8.4 -- -- -- --  MG -- -- -- 2.8* -- 2.7* -- -- 2.1  PHOS -- 4.5 -- 5.4* -- 5.3* 4.7* 5.8* --   Intake/Output      10/16 0701 - 10/17 0700 10/17 0701 - 10/18 0700   I.V. (mL/kg) 2247.8 (19.3)    Other 20    NG/GT 520    IV Piggyback 100    Total Intake(mL/kg) 2887.8 (24.8)    Urine (mL/kg/hr) 1640 (0.6)    Other 296    Total Output 1936    Net +951.8         Stool Occurrence 1 x     Foley:  10/8 >>>  A:  Dehydration.  ARF likely ATN Hypomagnesemia>>resolved Hypophosphatemia>>hyperphosphatemia Hyperkalemia- Resolved Started on CVVH 10/10>>10/16  P:   - Consider intermittent HD +/- Lasix; per renal - Continue volume removal given net positive.  - Monitor UOP, improved some  - Goal CVP 10 to12.  - Trend BMet, follow k  kayxlate given , stool noted  GASTROINTESTINAL  Lab 04/03/12 0440 04/02/12 0450 04/01/12 0440 03/31/12 0400 03/30/12 1830  AST 30 38* 43* 54* 58*  ALT 46 58* 67* 85* 86*  ALKPHOS 77 88 97 108 110  BILITOT 0.2* 0.3 0.3 0.3 0.3  PROT 5.9* 6.0 6.0 6.2 6.2  ALBUMIN 2.3* 2.3* 2.4* 2.4* 2.3*   A:  Suspected malnutrition. LFT elevation P:   - LFTs back to normal - TF advanced to goal 10/14, tolerating. Currently holding for procedure later today - Having bowel movements  HEMATOLOGIC  Lab 04/03/12 0434 04/02/12 0450 04/01/12 0440 03/31/12 0400 03/30/12 0410 03/29/12 0308 03/28/12 0240 03/27/12 1000  HGB 9.0* 9.8* 9.8* 10.1* 10.0* -- -- --  HCT 26.7* 29.4* 29.6* 31.5* 31.4* -- -- --  PLT 130* 142* 131* 104* 96* --  -- --  INR -- -- 1.02 -- -- 1.08 1.14 1.16  APTT 29 -- -- -- -- -- -- --   HIT neg  A:  Thrombocytopenia, etiology unclear P:  - Coags wnl - Platelets are improving, pos trach , if no bleeding, add sub q heparin -ddavp 20 mic now pre trach  - Trend CBC   INFECTIOUS  Lab 04/03/12 0434 04/02/12 0450 04/01/12 0440 03/31/12 0400 03/30/12 0410  WBC 7.4 9.8 12.7* 9.7 8.7  PROCALCITON -- -- -- 2.86 --   Cultures: 10/8 urine>>no growth 10/8  Blood >>> GPC in pairs >> Strep pneumo 10/8  Respiratory >>> Strep pneumo 10/9 nasal influ>> neg  Antibiotics: Azithromycin 10/8 x 1 Ceftriaxone 10/8 x 1 Zosyn 10/8 >>> 10/11 Vancomycin 10/8 >>> 10/11 Levaquin 10/8 (has best MIC vs pneumococcus at this hospital, higher than ceftriaxone) >>>  Tamiflu 10/9>>10/10  A:  Pneumonia (CAP vs aspiration),  pneumococcal PNA/sepsis P:   - Antibiotics / cultures as above, add stop date 04/04/12 for total 10 days Would allow to dc levo in am   ENDOCRINE  Lab 04/03/12 0335 04/03/12 0026 04/02/12 1949 04/02/12 1658 04/02/12 1148  GLUCAP 150* 193* 136* 154* 170*   A:  DM. P:   - ICU glycemic control protocol - Triglycerides elevated while on propofol   NEUROLOGIC QTC 440 last Tg elevated A:  Acute encephalopathy.  History of diabetic neuropathy. P:   - Goal RASS 0 to -1, diffuclt - Daily WUA - Con't Propofol and fentanyl gtt, wean as tolerated - Con't Versed scheduled - Massive sedation needs, Risperdal to 2 q12h - Started methadone 10 mg q8h on 10/16 if QTc <550. Monitor daily EKG, hold off escalation for now (ARF) - Consider adding scheduled PO benzo today  BEST PRACTICE / DISPOSITION Level of Care:  ICU Primary Service:  PCCM Consultants:  None Code Status:  Full Diet:  NPO- tube feeds at goal DVT Px:  SCD GI Px:  Protonix Skin Integrity:  Intact Social / Family: Daughters are points of contact. Updated at bedside and trach consent  Amber M. Hairford, M.D. 04/03/2012 7:07 AM     Ccm time 30 min   I have fully examined this patient and agree with above findings.    And edited in full Mcarthur Rossetti. Tyson Alias, MD, FACP Pgr: [phone] [facility] '

## 2012-04-03 NOTE — Procedures (Signed)
Bedside Tracheostomy Insertion Procedure Note   Patient Details:   Name: Gregory [name] DOB: [birth year] MRN: [id number]  Procedure: Tracheostomy  Pre Procedure Assessment: ET Tube Size:7.5 ET Tube secured at lip (cm):25 Bite block in place: Yes Breath Sounds: Diminished  Post Procedure Assessment: BP 96/41  Pulse 81  Temp 99.4 F (37.4 C) (Core (Comment))  Resp 30  Ht 6\' 1"  (1.854 m)  Wt 256 lb 13.4 oz (116.5 kg)  BMI 33.89 kg/m2  SpO2 100% O2 sats: stable throughout Complications: No apparent complications Patient did tolerate procedure well Tracheostomy Brand:Shiley Tracheostomy Style:Cuffed Tracheostomy Size: 8.0 Tracheostomy Secured WUJ:[id number] Tracheostomy Placement Confirmation:Trach cuff visualized and in place and Chest X ray ordered for placement    Kendall Flack Royal 04/03/2012, 11:47 AM

## 2012-04-03 NOTE — Progress Notes (Signed)
[facility] ROUNDING NOTE  SUBJECTIVE: Patient remains intubated and heavily sedated but sedation doses lighter per nursing For bedside trach today CRT stopped yesterday Making urine - close to 100 ml/hour E-link gave kayexelate this AM for potassium of 5.1  OBJECTIVE: I/O last 3 completed shifts: In: 4239.8 [I.V.:3064.8; NG/GT:1125; IV Piggyback:50] Out: 5715 [Urine:1620; Other:4095] Total I/O In: 1396.6 [I.V.:1156.6; Other:20; NG/GT:120; IV Piggyback:100] Out: 705 [Urine:705]   BP 105/50  Pulse 80  Temp 99.9 F (37.7 C) (Core (Comment))  Resp 28  Ht 6\' 1"  (1.854 m)  Wt 116.5 kg (256 lb 13.4 oz)  BMI 33.89 kg/m2  SpO2 97% Intubated, sedated ETT, OGT, right sided HD catheter Anteriorly fairly clear S1S2 no S3 No rub Abdomen protuberant with bowel sounds present Ext minimal edema  Lab 04/03/12 0440 04/02/12 0450 04/01/12 0440 03/31/12 0400 03/30/12 1830 03/30/12 0410 03/29/12 1600 03/28/12 1600 03/28/12 0240  NA 140 137 139 135 138 136 136 -- --  K 5.1 4.2 4.1 4.6 4.5 4.6 4.5 -- --  CL 106 105 105 100 102 100 101 -- --  CO2 24 24 24 23 23 23 23  -- --  GLUCOSE 156* 169* 161* 187* 162* 144* 97 -- --  BUN 64* 43* 57* 55* 54* 55* 57* -- --  CREATININE 2.60* 1.78* 2.29* 2.60* 2.77* 3.02* 3.41* -- --  ALB -- -- -- -- -- -- -- -- --  CALCIUM 8.4 8.3* 8.4 8.2* 8.4 8.2* 7.7* -- --  PHOS -- 4.5 -- 5.4* -- 5.3* 4.7* 5.8* 6.8*    Lab 04/03/12 0440 04/02/12 0450 04/01/12 0440  AST 30 38* 43*  ALT 46 58* 67*  ALKPHOS 77 88 97  BILITOT 0.2* 0.3 0.3  PROT 5.9* 6.0 6.0  ALBUMIN 2.3* 2.3* 2.4*    Lab 04/03/12 0434 04/02/12 0450 04/01/12 0440 03/31/12 0400  WBC 7.4 9.8 12.7* 9.7  NEUTROABS -- -- -- --  HGB 9.0* 9.8* 9.8* 10.1*  HCT 26.7* 29.4* 29.6* 31.5*  MCV 91.8 90.5 90.0 90.8  PLT 130* 142* 131* 104*    Lab 03/27/12 1600  CKTOTAL --  CKMB --  CKMBINDEX --  TROPONINI 0.77*     Lab 04/03/12 0335 04/03/12 0026 04/02/12 1949 04/02/12 1658 04/02/12  1148  GLUCAP 150* 193* 136* 154* 170*   Studies/Results: Dg Chest Port 1 View  04/02/2012  *RADIOLOGY REPORT*  Clinical Data: Shortness of breath, endotracheal tube, history diabetes, hypertension  PORTABLE CHEST - 1 VIEW  Comparison: Portable exam 0503 hours compared to 04/01/2012  Findings: Tip of endotracheal tube 3.5 cm above carina. Nasogastric is in the stomach. Left jugular central venous catheter tip projecting over SVC. Right jugular central venous catheter tip projecting over right jugular vein near the right subclavian vein confluence. Minimal cardiac enlargement. Perihilar basilar opacities question pulmonary edema though there is a component of bibasilar atelectasis as well. No gross pleural effusion or pneumothorax.  IMPRESSION: No interval change.   Original Report Authenticated By: Lollie Marrow, M.D.    Medications . sodium chloride 20 mL/hr at 03/30/12 1900  . fentaNYL infusion INTRAVENOUS 400 mcg/hr (04/03/12 0357)  . midazolam (VERSED) infusion 10 mg/hr (04/03/12 0500)  . propofol 34 mcg/kg/min (04/03/12 0500)  . DISCONTD: dialysis replacement fluid (prismasate) 200 mL/hr at 03/31/12 1821  . DISCONTD: dialysis replacement fluid (prismasate) 300 mL/hr at 04/01/12 1132  . DISCONTD: dialysate (PRISMASATE) 2,000 mL/hr at 04/02/12 0547   . albuterol  2.5 mg Nebulization Q4H  . amiodarone  200 mg  Per Tube BID  . antiseptic oral rinse  1 application Mouth Rinse QID  . chlorhexidine  15 mL Mouth/Throat BID  . etomidate  40 mg Intravenous Once  . feeding supplement (OXEPA)  1,000 mL Per Tube Q24H  . feeding supplement  60 mL Per Tube TID  . fentaNYL  200 mcg Intravenous Once  . heparin      . hydrALAZINE  25 mg Per Tube Q8H  . hydrocortisone sod succinate (SOLU-CORTEF) injection  50 mg Intravenous Q8H  . insulin aspart  2-6 Units Subcutaneous Q4H  . ipratropium  0.5 mg Nebulization Q4H  . levofloxacin (LEVAQUIN) IV  500 mg Intravenous Q48H  . methadone  10 mg Per Tube Q8H    . midazolam  1 mg Intravenous Q8H  . midazolam  4 mg Intravenous Once  . pantoprazole sodium  40 mg Per Tube QHS  . propofol  5-70 mcg/kg/min Intravenous Once  . risperiDONE  2 mg Oral BID  . sodium chloride  1,000 mL Intravenous Once  . sodium polystyrene  15 g Per Tube Once  . vecuronium  10 mg Intravenous Once  . DISCONTD: levofloxacin (LEVAQUIN) IV  250 mg Intravenous Q24H  . DISCONTD: risperiDONE  1 mg Oral QHS    I  have reviewed scheduled and prn medications.  ASSESSMENT/RECOMMENDATIONS  51 yo WM with AKI in the setting of pneumococcal pneumonia/sepsis, acute respiratory failure/ARDS/suspected underlying COPD, septic shock (off pressors), NSTEMI, Afib on amio (SR now), diabetes; most recent baseline creatinine 1.07 in 2010.   AKI - CRRT  10/10 - 10/16 Off X 24 hours Making good urine around 100 cc/hour Potassium up a little - s/p kayexelate per e-link for K of 5.1  Would favor watching off dialysis today, recheck potassium (scheduled for around noon) and again early evening. PRN lasix trial if UOP drops off or CVP rises.  May still require intermittent HD but does not require this AM   Camille Bal, MD [facility] [phone] Pager 04/03/2012, 6:49 AM

## 2012-04-04 LAB — GLUCOSE, CAPILLARY
Glucose-Capillary: 156 mg/dL — ABNORMAL HIGH (ref 70–99)
Glucose-Capillary: 158 mg/dL — ABNORMAL HIGH (ref 70–99)
Glucose-Capillary: 127 mg/dL — ABNORMAL HIGH (ref 70–99)
Glucose-Capillary: 150 mg/dL — ABNORMAL HIGH (ref 70–99)
Glucose-Capillary: 143 mg/dL — ABNORMAL HIGH (ref 70–99)

## 2012-04-04 LAB — CBC
HCT: 25.8 % — ABNORMAL LOW (ref 39.0–52.0)
MCHC: 32.2 g/dL (ref 30.0–36.0)
MCV: 92.5 fL (ref 78.0–100.0)
RDW: 14.9 % (ref 11.5–15.5)

## 2012-04-04 LAB — RENAL FUNCTION PANEL
BUN: 78 mg/dL — ABNORMAL HIGH (ref 6–23)
Chloride: 111 mEq/L (ref 96–112)
Creatinine, Ser: 2.82 mg/dL — ABNORMAL HIGH (ref 0.50–1.35)
Glucose, Bld: 167 mg/dL — ABNORMAL HIGH (ref 70–99)
Potassium: 4.7 mEq/L (ref 3.5–5.1)

## 2012-04-04 LAB — POCT I-STAT 3, ART BLOOD GAS (G3+)
pCO2 arterial: 46 mmHg — ABNORMAL HIGH (ref 35.0–45.0)
pH, Arterial: 7.389 (ref 7.350–7.450)
pO2, Arterial: 101 mmHg — ABNORMAL HIGH (ref 80.0–100.0)

## 2012-04-04 MED ORDER — FENTANYL CITRATE 0.05 MG/ML IJ SOLN
25.0000 ug | INTRAMUSCULAR | Status: DC | PRN
  Administered 2012-04-06: 50 ug via INTRAVENOUS
  Filled 2012-04-04: qty 2

## 2012-04-04 MED ORDER — OXEPA PO LIQD
1000.0000 mL | ORAL | Status: DC
  Administered 2012-04-04 – 2012-04-09 (×5): 1000 mL
  Filled 2012-04-04 (×7): qty 1000

## 2012-04-04 MED ORDER — MIDAZOLAM HCL 2 MG/2ML IJ SOLN
2.0000 mg | INTRAMUSCULAR | Status: DC | PRN
  Administered 2012-04-05 – 2012-04-08 (×7): 2 mg via INTRAVENOUS
  Filled 2012-04-04 (×8): qty 2

## 2012-04-04 MED ORDER — DEXMEDETOMIDINE HCL IN NACL 200 MCG/50ML IV SOLN
0.2000 ug/kg/h | INTRAVENOUS | Status: DC
  Administered 2012-04-04: 1.006 ug/kg/h via INTRAVENOUS
  Administered 2012-04-04: 1 ug/kg/h via INTRAVENOUS
  Administered 2012-04-04: 1.006 ug/kg/h via INTRAVENOUS
  Filled 2012-04-04 (×4): qty 50

## 2012-04-04 MED ORDER — HEPARIN SODIUM (PORCINE) 5000 UNIT/ML IJ SOLN
5000.0000 [IU] | Freq: Three times a day (TID) | INTRAMUSCULAR | Status: DC
  Administered 2012-04-04 – 2012-04-17 (×36): 5000 [IU] via SUBCUTANEOUS
  Filled 2012-04-04 (×42): qty 1

## 2012-04-04 MED ORDER — DEXMEDETOMIDINE HCL IN NACL 400 MCG/100ML IV SOLN
0.4000 ug/kg/h | INTRAVENOUS | Status: DC
  Administered 2012-04-04 (×2): 1 ug/kg/h via INTRAVENOUS
  Administered 2012-04-05 (×2): 0.999 ug/kg/h via INTRAVENOUS
  Administered 2012-04-05: 1 ug/kg/h via INTRAVENOUS
  Administered 2012-04-05: 0.999 ug/kg/h via INTRAVENOUS
  Administered 2012-04-05 (×3): 1 ug/kg/h via INTRAVENOUS
  Administered 2012-04-06 (×2): 0.9 ug/kg/h via INTRAVENOUS
  Filled 2012-04-04 (×11): qty 100

## 2012-04-04 MED ORDER — LORAZEPAM 2 MG/ML IJ SOLN
2.0000 mg | Freq: Four times a day (QID) | INTRAMUSCULAR | Status: DC
  Administered 2012-04-04 – 2012-04-07 (×12): 2 mg via INTRAVENOUS
  Filled 2012-04-04 (×12): qty 1

## 2012-04-04 MED ORDER — METHADONE HCL 10 MG PO TABS
20.0000 mg | ORAL_TABLET | Freq: Four times a day (QID) | ORAL | Status: DC
  Administered 2012-04-04 – 2012-04-08 (×14): 20 mg
  Filled 2012-04-04 (×4): qty 2
  Filled 2012-04-04: qty 1
  Filled 2012-04-04 (×3): qty 2
  Filled 2012-04-04: qty 1
  Filled 2012-04-04 (×6): qty 2

## 2012-04-04 NOTE — Progress Notes (Addendum)
Nutrition Follow-up  Intervention:    To better meet estimated needs, continue Oxepa and decrease rate to 20 ml/h with 60 ml Prostat TID to provide 1320 kcals, 120 gm protein (80% estimated needs), 377 ml free water daily.  TF plus Propofol will provide a total of 1985 kcals daily (23.7 kcals/kg ideal weight)  Will not be able to meet nutrition goal with current Propofol rate.  Assessment:   Patient S/P tracheostomy 10/17; remains on ventilator support.  CVVHD stopped 10/16, now with good UOP.  ARDS protocol in place.  TF was held for trach yesterday, but has been resumed. MV: 14 Temp:Temp (24hrs), Avg:99.5 F (37.5 C), Min:99 F (37.2 C), Max:99.8 F (37.7 C)  Propofol at 25.2 ml/hr providing 665 kcals/day.  TF order:  Oxepa at 30 ml/h with 60 ml Prostat TID providing 1680 kcals, 135 gm protein, 565 ml free water daily.  Total intake with TF and Prostat is 2345 kcals/day.  Meds: Scheduled Meds:    . albuterol  2.5 mg Nebulization Q4H  . amiodarone  200 mg Per Tube Daily  . antiseptic oral rinse  1 application Mouth Rinse QID  . chlorhexidine  15 mL Mouth/Throat BID  . etomidate  40 mg Intravenous Once  . feeding supplement (OXEPA)  1,000 mL Per Tube Q24H  . feeding supplement  60 mL Per Tube TID  . heparin subcutaneous  5,000 Units Subcutaneous Q8H  . hydrALAZINE  25 mg Per Tube Q8H  . hydrocortisone sod succinate (SOLU-CORTEF) injection  25 mg Intravenous Q8H  . insulin aspart  2-6 Units Subcutaneous Q4H  . ipratropium  0.5 mg Nebulization Q4H  . levofloxacin (LEVAQUIN) IV  500 mg Intravenous Q48H  . LORazepam  2 mg Intravenous Q8H  . methadone  15 mg Per Tube Q8H  . pantoprazole sodium  40 mg Per Tube QHS  . risperiDONE  2 mg Oral BID  . sodium chloride  1,000 mL Intravenous Once  . DISCONTD: methadone  10 mg Per Tube Q8H  . DISCONTD: midazolam  1 mg Intravenous Q8H   Continuous Infusions:    . sodium chloride 20 mL/hr at 04/03/12 1515  . fentaNYL infusion  INTRAVENOUS 325 mcg/hr (04/04/12 0300)  . midazolam (VERSED) infusion 9 mg/hr (04/04/12 1009)  . propofol 34.063 mcg/kg/min (04/04/12 1009)   PRN Meds:.acetaminophen (TYLENOL) oral liquid 160 mg/5 mL, albuterol, fentaNYL, labetalol  Labs:  CMP     Component Value Date/Time   NA 145 04/04/2012 0422   K 4.7 04/04/2012 0422   CL 111 04/04/2012 0422   CO2 23 04/04/2012 0422   GLUCOSE 167* 04/04/2012 0422   BUN 78* 04/04/2012 0422   CREATININE 2.82* 04/04/2012 0422   CALCIUM 8.6 04/04/2012 0422   PROT 5.9* 04/03/2012 0440   ALBUMIN 2.2* 04/04/2012 0422   AST 30 04/03/2012 0440   ALT 46 04/03/2012 0440   ALKPHOS 77 04/03/2012 0440   BILITOT 0.2* 04/03/2012 0440   GFRNONAA 24* 04/04/2012 0422   GFRAA 28* 04/04/2012 0422   CBG (last 3)   Basename 04/04/12 0824 04/04/12 0301 04/04/12 0016  GLUCAP 150* 127* 156*    Sodium  Date/Time Value Range Status  04/04/2012  4:22 AM 145  135 - 145 mEq/L Final  04/03/2012  4:47 PM 145  135 - 145 mEq/L Final  04/03/2012 12:12 PM 142  135 - 145 mEq/L Final    Potassium  Date/Time Value Range Status  04/04/2012  4:22 AM 4.7  3.5 - 5.1 mEq/L Final  04/03/2012  4:47 PM 3.8  3.5 - 5.1 mEq/L Final  04/03/2012 12:12 PM 4.8  3.5 - 5.1 mEq/L Final    Phosphorus  Date/Time Value Range Status  04/04/2012  4:22 AM 7.5* 2.3 - 4.6 mg/dL Final  16/03/9603  5:40 PM 5.6* 2.3 - 4.6 mg/dL Final  98/04/9146  8:29 AM 4.5  2.3 - 4.6 mg/dL Final    Magnesium  Date/Time Value Range Status  04/04/2012  4:22 AM 2.2  1.5 - 2.5 mg/dL Final  56/21/3086  5:78 AM 2.8* 1.5 - 2.5 mg/dL Final  46/96/2952  8:41 AM 2.7* 1.5 - 2.5 mg/dL Final     Intake/Output Summary (Last 24 hours) at 04/04/12 1124 Last data filed at 04/04/12 0600  Gross per 24 hour  Intake 2160.9 ml  Output   2035 ml  Net  125.9 ml    Weight Status:  115.3 kg down from 119.6 kg (10/14) with negative fluid status; BMI=33.5  Re-estimated needs:  2495 kcals, 150-165 gm protein, 2.4-2.6  liters fluid daily  Nutrition Dx:  Inadequate oral intake related to inability to eat as evidenced by NPO status, ongoing.  Goal:  Enteral nutrition to provide 60-70% of estimated calorie needs (22-25 kcals/kg ideal body weight) and >/= 90% of estimated protein needs, based on ASPEN guidelines for permissive underfeeding in critically ill obese individuals, unmet.  Monitor:  TF tolerance, labs, weight trend, vent status, renal function, propofol use.   Joaquin Courts, RD, LDN, CNSC Pager# [phone] After Hours Pager# [phone]

## 2012-04-04 NOTE — Progress Notes (Signed)
Name: Gregory [name] MRN: [id number] DOB: 08/10/1960    LOS: 10  Referring Provider:  EDP Reason for Referral:  Acute respiratory failure  PULMONARY / CRITICAL CARE MEDICINE  Brief patient description:  51 yo, hx obesity, presumed OHS, COPD, DM, brought to [facility] ED on 10/8 with hypotension and respiratory distress and intubated for acute respiratory failure and diffuse B infiltrates.   Events Since Admission: 10/8  Brought to [facility] ED on 10/8 in respiratory distress and intubated for B infiltrates, shock 10/9 placed on ARDS 10/9 Positive blood cultures reported 10/10 HD cath placed, CVVH started 10/13 Pressors off 10/14 htn, labetolol 10/16 CVVH off 10/17 Trach placement  Current Status: Critical  Subjective:  S/p trach. Continues to require sedation.  Vital Signs: Temp:  [99 F (37.2 C)-99.8 F (37.7 C)] 99.5 F (37.5 C) (10/18 0600) Pulse Rate:  [68-85] 80  (10/18 0600) Resp:  [20-30] 26  (10/18 0600) BP: (79-114)/(33-55) 107/49 mmHg (10/18 0559) SpO2:  [93 %-100 %] 98 % (10/18 0600) FiO2 (%):  [40 %-60 %] 40 % (10/18 0345) Weight:  [254 lb 3.1 oz (115.3 kg)] 254 lb 3.1 oz (115.3 kg) (10/18 0500)  Intake/Output Summary (Last 24 hours) at 04/04/12 0710 Last data filed at 04/04/12 0600  Gross per 24 hour  Intake 2543.7 ml  Output   2310 ml  Net  233.7 ml   Physical Examination: General:  Mechanically ventilated Neuro:  Sedated, nonfocal HEENT:  NGT in place. Edema left eye improved. Trach without edema or irritation, small amount of dried blood Cardiovascular: RRR Lungs:  Bilateral air entry,  coarse throughout  Abdomen:  Soft, nontender,  +BS Musculoskeletal:  Atraumatic Skin:  No rash. Dry skin on feet  Active Problems:  Pneumococcal pneumonia  Acute respiratory failure  Septic shock(785.52)  Diabetes  Hyperglycemia  Acute encephalopathy  Acute renal failure  ASSESSMENT AND PLAN  PULMONARY  Lab 04/04/12 0430 04/02/12 0457 04/01/12 1342 03/31/12 0526  03/30/12 0416  PHART 7.389 7.395 7.352 7.295* 7.259*  PCO2ART 46.0* 46.0* 48.3* 53.0* 54.7*  PO2ART 101.0* 93.0 101.0* 79.0* 79.1*  HCO3 27.7* 28.3* 27.0* 26.0* 23.7  O2SAT 98.0 97.0 98.0 94.0 92.6   Ventilator Settings: Vent Mode:  [-] PRVC FiO2 (%):  [40 %-60 %] 40 % Set Rate:  [28 bmp-30 bmp] 28 bmp Vt Set:  [470 mL] 470 mL PEEP:  [5 cmH20] 5 cmH20 Plateau Pressure:  [10 cmH20-17 cmH20] 12 cmH20  CXR:  10/17> Trach midline. Bilateral infiltrates, LLL continues to be more focally consolidated ETT:  10/8 >>>10/17 Tracheostomy (df): 10/17>>> Bedside u/s for eval of L pleural effusion: 10/11>> Only small amount of fluid. Not enough to sample  A:  Acute respiratory failure.  Pneumonia (CAP vs. Viral) +/- ARDS.  Less likely pulmonary edema.  Suspected COPD (without evidence of exacerbation) - smokes 2-3 packs of cigarettes per day. P:   - ARDS protocol 10/9. Cont Vt at 6cc/kg ( ). Rate currently 28, decrease to 22 - Trach collar trials when MS allows - Con't combivent scheduled - Abx stop today - Neg balance goal not met but closer to goal; net positive 233 -consider SBT cpa 5 ps 5-10, apnea, tv alarms in setting high sedation needs -No trach collar this am   CARDIOVASCULAR No results found for this basename: TROPONINI:5,LATICACIDVEN:5, O2SATVEN:5,PROBNP:5 in the last 168 hours ECG:  10/16 NSR on monitor, rate controlled in 60's Lines: L IJ TLC 10/8 >>> R HD Cath 10/10>> A line L radial 10/13>> TTE: Mild LVH.  Systolic function was normal. The estimated ejection fraction was in the range of 50% to 55%.  A: Septic Shock.  Less likely acute systolic congestive heart failure with cardiogenic shock.  NSTEMI likely secondary to shock, hypoxemia. - Troponin plateau  Afib 10/9 >> started on amio P:  - Amiodarone for Afib PO per tube 200 daily, consider dc 04/06/12 days if remains sinus - Continue Hydralazine 25mg  TID with Labetalol 5mg  q2 prn with hold parameters - Cortisol level  11 >> hydrocort initiated 10/9. Taper started 10/14, current dose 25 q8, dc steroids today   RENAL  Lab 04/04/12 0422 04/03/12 1647 04/03/12 1212 04/03/12 0440 04/02/12 0450 03/31/12 0400 03/30/12 0410  NA 145 145 142 140 137 -- --  K 4.7 3.8 -- -- -- -- --  CL 111 117* 107 106 105 -- --  CO2 23 18* 22 24 24  -- --  BUN 78* 58* 67* 64* 43* -- --  CREATININE 2.82* 2.20* 2.75* 2.60* 1.78* -- --  CALCIUM 8.6 6.4* 8.3* 8.4 8.3* -- --  MG 2.2 -- -- -- -- 2.8* 2.7*  PHOS 7.5* 5.6* -- -- 4.5 5.4* 5.3*   Intake/Output      10/17 0701 - 10/18 0700 10/18 0701 - 10/19 0700   I.V. (mL/kg) 1933.7 (16.8)    Other     NG/GT 610    IV Piggyback     Total Intake(mL/kg) 2543.7 (22.1)    Urine (mL/kg/hr) 2310 (0.8)    Other     Total Output 2310    Net +233.7         Stool Occurrence 1 x     Foley:  10/8 >>>  A:  Dehydration.  ARF likely ATN Hypomagnesemia>>resolved Hypophosphatemia>>hyperphosphatemia Hyperkalemia- Resolved Started on CVVH 10/10>>10/16  P:   - s/p CVVH. Renal following. No indication for intermittent HD now, will continue to monitor  - Monitor UOP, improving - Goal CVP 10 to12.  - Trend BMet, follow K+ -low threshold lasix in am if pos balance again  GASTROINTESTINAL  Lab 04/04/12 0422 04/03/12 1647 04/03/12 0440 04/02/12 0450 04/01/12 0440 03/31/12 0400 03/30/12 1830  AST -- -- 30 38* 43* 54* 58*  ALT -- -- 46 58* 67* 85* 86*  ALKPHOS -- -- 77 88 97 108 110  BILITOT -- -- 0.2* 0.3 0.3 0.3 0.3  PROT -- -- 5.9* 6.0 6.0 6.2 6.2  ALBUMIN 2.2* 1.6* 2.3* 2.3* 2.4* -- --   A:  Suspected malnutrition. LFT elevation P:   - LFTs back to normal - TF advanced to goal 10/14, tolerating.  - Having bowel movements after kayxlate  HEMATOLOGIC  Lab 04/04/12 0422 04/03/12 0434 04/02/12 0450 04/01/12 0440 03/31/12 0400 03/29/12 0308  HGB 8.3* 9.0* 9.8* 9.8* 10.1* --  HCT 25.8* 26.7* 29.4* 29.6* 31.5* --  PLT 150 130* 142* 131* 104* --  INR -- -- -- 1.02 -- 1.08  APTT --  29 -- -- -- --   HIT neg  A:  Thrombocytopenia, etiology unclear, dilutional P:  - Coags wnl - Platelets are improving; Restart sub q heparin - Trend CBC  Neg balance is goal  INFECTIOUS  Lab 04/04/12 0422 04/03/12 0434 04/02/12 0450 04/01/12 0440 03/31/12 0400  WBC 9.5 7.4 9.8 12.7* 9.7  PROCALCITON -- -- -- -- 2.86   Cultures: 10/8 urine>>no growth 10/8  Blood >>> GPC in pairs >> Strep pneumo 10/8  Respiratory >>> Strep pneumo 10/9 nasal influ>> neg  Antibiotics: Azithromycin 10/8 x 1 Ceftriaxone  10/8 x 1 Zosyn 10/8 >>> 10/11 Vancomycin 10/8 >>> 10/11 Levaquin 10/8 (has best MIC vs pneumococcus at this hospital, higher than ceftriaxone) >>>  Tamiflu 10/9>>10/10  A:  Pneumonia (CAP vs aspiration),  pneumococcal PNA/sepsis P:   - Antibiotics / cultures as above, add stop date 04/04/12 for total 10 days, allow to dc   ENDOCRINE  Lab 04/04/12 0301 04/03/12 1151 04/03/12 0756 04/03/12 0335 04/03/12 0026  GLUCAP 127* 156* 131* 150* 193*   A:  DM. P:   - ICU glycemic control protocol - Triglycerides elevated while on propofol, absolute to dc   NEUROLOGIC QTC 414 last 10/18 Tg elevated A:  Acute encephalopathy.  History of diabetic neuropathy. Severe agitation P:   - Goal RASS 0 to -1, difficult. Massive sedation needs - Daily WUA - Con't Propofol and fentanyl gtt, wean as tolerated.  - Need to d/c Propofol when able due to hypertriglyceridemia  - Con't Versed scheduled - Con't Risperdal 2 q12h - Started methadone 10 mg q8h on 10/16>> Increased to 20 q6h ( pls note excellent output and daily qtc 414) - Ativan 2mg  q8 scheduled, increase q6h - Monitor daily EKG reviewed -attempt precedex, then dc versed, would tolerate fent drip with precedex  BEST PRACTICE / DISPOSITION Level of Care:  ICU Primary Service:  PCCM Consultants:  None Code Status:  Full Diet:  tube feeds at goal DVT Px:  SCD GI Px:  Protonix Skin Integrity:  Intact Social / Family: Daughters  are points of contact. Updated at bedside daily  [employer]. [place], M.D. 04/04/2012 7:10 AM   Ccm time 30 min   I have fully examined this patient and agree with above findings.    And ed ited infull  Mcarthur Rossetti. Tyson Alias, MD, FACP Pgr: [phone] [facility]

## 2012-04-04 NOTE — Progress Notes (Signed)
Clinical Social Worker staffed case with RNCM.  CSW to continue to follow and assist as needed.   Leeam Cedrone, MSW, LCSWA [phone] 

## 2012-04-04 NOTE — Progress Notes (Signed)
Diarrhea   We will check a C. Diff and apply a Flexiseal.

## 2012-04-04 NOTE — Progress Notes (Signed)
[facility] ROUNDING NOTE  SUBJECTIVE:  Patient remains intubatedand sedated   Bedside trach 10/17  CRT stopped 10/16  Making urine - close to 100 ml/hour  OBJECTIVE: BP 107/49  Pulse 80  Temp 99.5 F (37.5 C) (Core (Comment))  Resp 26  Ht 6\' 1"  (1.854 m)  Wt 115.3 kg (254 lb 3.1 oz)  BMI 33.54 kg/m2  SpO2 98% I/O last 3 completed shifts: In: 4200.7 [I.V.:3280.7; Other:20; NG/GT:800; IV Piggyback:100] Out: 3340 [Urine:3340]  04/04/12 0500 115.3 kg (254 lb 3.1 oz) -- -- CF  04/03/12 0500 116.5 kg (256 lb 13.4 oz) -- -- [facility]  04/02/12 0443 118.4 kg (261 lb 0.4 oz) -- -- CF  Sedated on the vent Trach Right Ij temporary dialysis catheter NG tube with TF's going Lungs with coarse rhonchi S1S2 no S3  Regular Abdomen protuberant but soft Trace edema  Lab 04/04/12 0422 04/03/12 1647 04/03/12 1212 04/03/12 0440 04/02/12 0450 04/01/12 0440 03/31/12 0400 03/30/12 0410 03/29/12 1600 03/28/12 1600  NA 145 145 142 140 137 139 135 -- -- --  K 4.7 3.8 4.8 5.1 4.2 4.1 4.6 -- -- --  CL 111 117* 107 106 105 105 100 -- -- --  CO2 23 18* 22 24 24 24 23  -- -- --  GLUCOSE 167* 152* 154* 156* 169* 161* 187* -- -- --  BUN 78* 58* 67* 64* 43* 57* 55* -- -- --  CREATININE 2.82* 2.20* 2.75* 2.60* 1.78* 2.29* 2.60* -- -- --  ALB -- -- -- -- -- -- -- -- -- --  CALCIUM 8.6 6.4* 8.3* 8.4 8.3* 8.4 8.2* -- -- --  PHOS 7.5* 5.6* -- -- 4.5 -- 5.4* 5.3* 4.7* 5.8*    Lab 04/04/12 0422 04/03/12 1647 04/03/12 0440 04/02/12 0450 04/01/12 0440  AST -- -- 30 38* 43*  ALT -- -- 46 58* 67*  ALKPHOS -- -- 77 88 97  BILITOT -- -- 0.2* 0.3 0.3  PROT -- -- 5.9* 6.0 6.0  ALBUMIN 2.2* 1.6* 2.3* -- --   Lab 04/04/12 0422 04/03/12 0434 04/02/12 0450 04/01/12 0440  WBC 9.5 7.4 9.8 12.7*  NEUTROABS -- -- -- --  HGB 8.3* 9.0* 9.8* 9.8*  HCT 25.8* 26.7* 29.4* 29.6*  MCV 92.5 91.8 90.5 90.0  PLT 150 130* 142* 131*    Lab 04/04/12 0301 04/03/12 1151 04/03/12 0756 04/03/12 0335 04/03/12 0026    GLUCAP 127* 156* 131* 150* 193*  Studies/Results: Chest Portable 1 View To Assess Tube Placement And Rule-out Pneumothorax  04/03/2012  *RADIOLOGY REPORT*  Clinical Data: Post tracheostomy tube placement.  Rule out pneumothorax.  PORTABLE CHEST - 1 VIEW  Comparison: 04/03/2012 6:19 a.m.  Findings: Tracheostomy tube tip midline.  Left central line tip distal left bracheocephalic vein level.  Right central line tip projects over the right lung apex unchanged in position.  Exact position indeterminate.  No gross pneumothorax.  Heart size top normal.  Pulmonary vascular congestion/mild pulmonary edema.  Consolidation left base may represent atelectasis or infiltrate.  Small pleural effusions not excluded.  Limited evaluation of the aorta.  IMPRESSION: Tracheostomy tube tip midline.  No gross pneumothorax.  Pulmonary vascular congestion/mild pulmonary edema.  Consolidation left base may represent atelectasis or infiltrate.  Heart size top normal.  Left central line tip distal left bracheocephalic vein level.  Right central line tip projects over the right lung apex unchanged in position.  Exact position indeterminate.  This is a call report.  No   Original Report Authenticated By:  Fuller Canada, M.D.     Medications: . sodium chloride 20 mL/hr at 04/03/12 1515  . fentaNYL infusion INTRAVENOUS 325 mcg/hr (04/04/12 0300)  . midazolam (VERSED) infusion 9 mg/hr (04/04/12 0200)  . propofol 34 mcg/kg/min (04/04/12 0228)   . albuterol  2.5 mg Nebulization Q4H  . amiodarone  200 mg Per Tube Daily  . antiseptic oral rinse  1 application Mouth Rinse QID  . chlorhexidine  15 mL Mouth/Throat BID  . desmopressin (DDAVP) IV  20 mcg Intravenous Once  . etomidate  40 mg Intravenous Once  . feeding supplement (OXEPA)  1,000 mL Per Tube Q24H  . feeding supplement  60 mL Per Tube TID  . fentaNYL  200 mcg Intravenous Once  . heparin subcutaneous  5,000 Units Subcutaneous Q8H  . hydrALAZINE  25 mg Per Tube Q8H  .  hydrocortisone sod succinate (SOLU-CORTEF) injection  25 mg Intravenous Q8H  . insulin aspart  2-6 Units Subcutaneous Q4H  . ipratropium  0.5 mg Nebulization Q4H  . levofloxacin (LEVAQUIN) IV  500 mg Intravenous Q48H  . LORazepam  2 mg Intravenous Q8H  . methadone  15 mg Per Tube Q8H  . midazolam  4 mg Intravenous Once  . pantoprazole sodium  40 mg Per Tube QHS  . propofol  5-70 mcg/kg/min Intravenous Once  . risperiDONE  2 mg Oral BID  . sodium chloride  1,000 mL Intravenous Once  . vecuronium  10 mg Intravenous Once  . DISCONTD: amiodarone  200 mg Per Tube BID  . DISCONTD: methadone  10 mg Per Tube Q8H  . DISCONTD: midazolam  1 mg Intravenous Q8H    ASSESSMENT/RECOMMENDATIONS  51 yo WM with AKI in the setting of pneumococcal pneumonia/sepsis, acute respiratory failure/ARDS/suspected underlying COPD, septic shock (off pressors), NSTEMI, Afib on amio (SR now), diabetes; most recent baseline creatinine 1.07 in 2010.   AKI - CRRT 10/10 - 10/16  Excellent UOP since d/c of CRRT Potassium stable Creatinine 2.7-2.8 and not sure where will level off but appears to be doing so   Making good urine around 100 cc/hour  If additional negative fluid balance needed would use lasix  I doubt that he will require further renal replacement therapy but will watch another 24 hours or so before pulling catheter (placed 10/10)   Camille Bal, MD [facility] [phone] Pager 04/04/2012, 8:21 AM

## 2012-04-05 ENCOUNTER — Inpatient Hospital Stay (HOSPITAL_COMMUNITY): Payer: 59

## 2012-04-05 LAB — BLOOD GAS, ARTERIAL
Bicarbonate: 24.6 mEq/L — ABNORMAL HIGH (ref 20.0–24.0)
FIO2: 0.4 %
O2 Saturation: 99 %
PEEP: 5 cmH2O
TCO2: 26 mmol/L (ref 0–100)
pO2, Arterial: 118 mmHg — ABNORMAL HIGH (ref 80.0–100.0)

## 2012-04-05 LAB — CBC
Platelets: 143 10*3/uL — ABNORMAL LOW (ref 150–400)
RBC: 2.82 MIL/uL — ABNORMAL LOW (ref 4.22–5.81)
RDW: 14.7 % (ref 11.5–15.5)
WBC: 7.6 10*3/uL (ref 4.0–10.5)

## 2012-04-05 LAB — RENAL FUNCTION PANEL
Albumin: 2.4 g/dL — ABNORMAL LOW (ref 3.5–5.2)
Chloride: 116 mEq/L — ABNORMAL HIGH (ref 96–112)
GFR calc Af Amer: 31 mL/min — ABNORMAL LOW (ref >90)
GFR calc non Af Amer: 27 mL/min — ABNORMAL LOW (ref >90)
Potassium: 5.5 mEq/L — ABNORMAL HIGH (ref 3.5–5.1)
Sodium: 151 mEq/L — ABNORMAL HIGH (ref 135–145)

## 2012-04-05 LAB — GLUCOSE, CAPILLARY
Glucose-Capillary: 186 mg/dL — ABNORMAL HIGH (ref 70–99)
Glucose-Capillary: 150 mg/dL — ABNORMAL HIGH (ref 70–99)
Glucose-Capillary: 156 mg/dL — ABNORMAL HIGH (ref 70–99)

## 2012-04-05 MED ORDER — FREE WATER
200.0000 mL | Freq: Four times a day (QID) | Status: DC
  Administered 2012-04-05 – 2012-04-07 (×8): 200 mL

## 2012-04-05 MED ORDER — IPRATROPIUM-ALBUTEROL 18-103 MCG/ACT IN AERO
6.0000 | INHALATION_SPRAY | RESPIRATORY_TRACT | Status: DC
  Administered 2012-04-05 – 2012-04-06 (×8): 6 via RESPIRATORY_TRACT
  Filled 2012-04-05: qty 14.7

## 2012-04-05 MED ORDER — SODIUM POLYSTYRENE SULFONATE 15 GM/60ML PO SUSP
30.0000 g | Freq: Once | ORAL | Status: AC
  Administered 2012-04-05: 30 g
  Filled 2012-04-05: qty 120

## 2012-04-05 MED ORDER — PIPERACILLIN-TAZOBACTAM 3.375 G IVPB
3.3750 g | Freq: Three times a day (TID) | INTRAVENOUS | Status: DC
  Administered 2012-04-05 – 2012-04-13 (×24): 3.375 g via INTRAVENOUS
  Filled 2012-04-05 (×28): qty 50

## 2012-04-05 MED ORDER — PROPOFOL 10 MG/ML IV EMUL
INTRAVENOUS | Status: AC
  Filled 2012-04-05: qty 100

## 2012-04-05 MED ORDER — VANCOMYCIN HCL 1000 MG IV SOLR
1500.0000 mg | INTRAVENOUS | Status: DC
  Administered 2012-04-05 – 2012-04-07 (×3): 1500 mg via INTRAVENOUS
  Filled 2012-04-05 (×4): qty 1500

## 2012-04-05 NOTE — Progress Notes (Signed)
Hyperkalemia   Kayexalate ordered; EKG stat

## 2012-04-05 NOTE — Progress Notes (Signed)
ANTIBIOTIC CONSULT NOTE - FOLLOW UP  Pharmacy Consult for vanc/zosyn Indication: PNA  No Known Allergies  Patient Measurements: Height: 6\' 1"  (185.4 cm) Weight: 244 lb 11.4 oz (111 kg) IBW/kg (Calculated) : 79.9    Vital Signs: Temp: 102 F (38.9 C) (10/19 0700) Temp src: Core (Comment) (10/19 0400) BP: 135/60 mmHg (10/19 0700) Intake/Output from previous day: 10/18 0701 - 10/19 0700 In: 2322.1 [I.V.:1562.1; NG/GT:760] Out: 5825 [Urine:5225; Stool:600] Intake/Output from this shift:    Labs:  Basename 04/05/12 0500 04/05/12 0400 04/04/12 0422 04/03/12 1647 04/03/12 0434  WBC 7.6 -- 9.5 -- 7.4  HGB 8.4* -- 8.3* -- 9.0*  PLT 143* -- 150 -- 130*  LABCREA -- -- -- -- --  CREATININE -- 2.61* 2.82* 2.20* --   Estimated Creatinine Clearance: 43.2 ml/min (by C-G formula based on Cr of 2.61). No results found for this basename: VANCOTROUGH:2,VANCOPEAK:2,VANCORANDOM:2,GENTTROUGH:2,GENTPEAK:2,GENTRANDOM:2,TOBRATROUGH:2,TOBRAPEAK:2,TOBRARND:2,AMIKACINPEAK:2,AMIKACINTROU:2,AMIKACIN:2, in the last 72 hours   Microbiology: Recent Results (from the past 720 hour(s))  URINE CULTURE     Status: Normal   Collection Time   03/25/12  4:50 PM      Component Value Range Status Comment   Specimen Description URINE, CATHETERIZED   Final    Special Requests NONE   Final    Culture  Setup Time 03/25/2012 17:58   Final    Colony Count NO GROWTH   Final    Culture NO GROWTH   Final    Report Status 03/26/2012 FINAL   Final   CULTURE, BLOOD (ROUTINE X 2)     Status: Normal   Collection Time   03/25/12  4:55 PM      Component Value Range Status Comment   Specimen Description BLOOD ARM RIGHT   Final    Special Requests     Final    Value: BOTTLES DRAWN AEROBIC AND ANAEROBIC BLUE 10CC RED 5CC   Culture  Setup Time 03/26/2012 01:32   Final    Culture     Final    Value: STREPTOCOCCUS PNEUMONIAE     Note: Gram Stain Report Called to,Read Back By and Verified With: [facility] WHITE @ 1519 03/26/12 BY  KRAWS   Report Status 03/28/2012 FINAL   Final    Organism ID, Bacteria STREPTOCOCCUS PNEUMONIAE   Final   CULTURE, BLOOD (ROUTINE X 2)     Status: Normal   Collection Time   03/25/12  5:15 PM      Component Value Range Status Comment   Specimen Description BLOOD HAND RIGHT   Final    Special Requests BOTTLES DRAWN AEROBIC ONLY 4CC   Final    Culture  Setup Time 03/26/2012 01:32   Final    Culture     Final    Value: STREPTOCOCCUS PNEUMONIAE     Note: SUSCEPTIBILITIES PERFORMED ON PREVIOUS CULTURE WITHIN THE LAST 5 DAYS.     Note: Gram Stain Report Called to,Read Back By and Verified With: [facility] WHITE @ 1519 03/26/12 BY KRAWS   Report Status 03/28/2012 FINAL   Final   MRSA PCR SCREENING     Status: Normal   Collection Time   03/25/12  8:58 PM      Component Value Range Status Comment   MRSA by PCR NEGATIVE  NEGATIVE Final   CULTURE, RESPIRATORY     Status: Normal   Collection Time   03/25/12  9:15 PM      Component Value Range Status Comment   Specimen Description TRACHEAL ASPIRATE   Final  Special Requests NONE   Final    Gram Stain     Final    Value: MODERATE WBC PRESENT, PREDOMINANTLY PMN     NO SQUAMOUS EPITHELIAL CELLS SEEN     FEW GRAM POSITIVE RODS     FEW GRAM POSITIVE COCCI IN PAIRS     IN CHAINS RARE GRAM NEGATIVE RODS   Culture     Final    Value: MODERATE STREPTOCOCCUS PNEUMONIAE     FEW CANDIDA ALBICANS   Report Status 03/29/2012 FINAL   Final    Organism ID, Bacteria STREPTOCOCCUS PNEUMONIAE   Final     Anti-infectives     Start     Dose/Rate Route Frequency Ordered Stop   04/04/12 2200   levofloxacin (LEVAQUIN) IVPB 500 mg        500 mg 100 mL/hr over 60 Minutes Intravenous Every 48 hours 04/03/12 0827 04/04/12 2200   04/02/12 2200   levofloxacin (LEVAQUIN) IVPB 500 mg        500 mg 100 mL/hr over 60 Minutes Intravenous Every 48 hours 04/02/12 1120 04/02/12 2311   03/28/12 2200   Levofloxacin (LEVAQUIN) IVPB 250 mg  Status:  Discontinued        250  mg 50 mL/hr over 60 Minutes Intravenous Every 24 hours 03/28/12 1339 04/02/12 1120   03/28/12 1130   cefTRIAXone (ROCEPHIN) 2 g in dextrose 5 % 50 mL IVPB  Status:  Discontinued        2 g 100 mL/hr over 30 Minutes Intravenous Every 12 hours 03/28/12 1126 03/28/12 1128   03/27/12 2200   vancomycin (VANCOCIN) 1,250 mg in sodium chloride 0.9 % 250 mL IVPB  Status:  Discontinued        1,250 mg 166.7 mL/hr over 90 Minutes Intravenous Every 24 hours 03/27/12 1452 03/28/12 1126   03/27/12 2200   Levofloxacin (LEVAQUIN) IVPB 250 mg  Status:  Discontinued        250 mg 50 mL/hr over 60 Minutes Intravenous Every 24 hours 03/27/12 1740 03/28/12 1126   03/27/12 2200   piperacillin-tazobactam (ZOSYN) IVPB 3.375 g  Status:  Discontinued        3.375 g 12.5 mL/hr over 240 Minutes Intravenous 4 times per day 03/27/12 1740 03/28/12 1126   03/27/12 2030   levofloxacin (LEVAQUIN) IVPB 750 mg  Status:  Discontinued        750 mg 100 mL/hr over 90 Minutes Intravenous Every 48 hours 03/25/12 2007 03/27/12 1740   03/26/12 1600   oseltamivir (TAMIFLU) capsule 75 mg  Status:  Discontinued        75 mg Oral Daily 03/26/12 1442 03/27/12 0824   03/26/12 1400   piperacillin-tazobactam (ZOSYN) IVPB 3.375 g  Status:  Discontinued        3.375 g 12.5 mL/hr over 240 Minutes Intravenous 3 times per day 03/26/12 0907 03/27/12 1740   03/26/12 0230   piperacillin-tazobactam (ZOSYN) IVPB 2.25 g  Status:  Discontinued        2.25 g 100 mL/hr over 30 Minutes Intravenous 4 times per day 03/25/12 2336 03/26/12 0907   03/25/12 2015   piperacillin-tazobactam (ZOSYN) IVPB 2.25 g        2.25 g 100 mL/hr over 30 Minutes Intravenous  Once 03/25/12 2005 03/25/12 2216   03/25/12 2015   vancomycin (VANCOCIN) 2,000 mg in sodium chloride 0.9 % 500 mL IVPB        2,000 mg 250 mL/hr over 120 Minutes Intravenous  Once 03/25/12  2005 03/26/12 0040   03/25/12 1945   levofloxacin (LEVAQUIN) IVPB 750 mg  Status:  Discontinued         750 mg 100 mL/hr over 90 Minutes Intravenous Every 24 hours 03/25/12 1937 03/25/12 2007   03/25/12 1645   cefTRIAXone (ROCEPHIN) 1 g in dextrose 5 % 50 mL IVPB        1 g 100 mL/hr over 30 Minutes Intravenous  Once 03/25/12 1634 03/25/12 1729   03/25/12 1645   azithromycin (ZITHROMAX) 500 mg in dextrose 5 % 250 mL IVPB        500 mg 250 mL/hr over 60 Minutes Intravenous  Once 03/25/12 1634 03/25/12 1815          Assessment: 51 yo male with pneumococcal PNA. She was treated for 10 days of abx. Levaquin was stopped yesterday. Still with fevers today so CCM restarting abx. Ordered vanc/zosyn to cover for HCAP now. Pt is off of CRRT now.   Azithromycin 10/8 x 1 Ceftriaxone 10/8 x 1 Vanc 10/8  x 1 Vanc 10/10  >> 10/11, 10/19>> Zosyn 10/8 >>10/11, 10/19>> Levaquin 10/10 >> 10/18    10/8 Blood x 2 >> 2/2 strep (S to levaquin) 10/8 Urine >> NG 10/8 Trach Asp >> Strep PN. Candida 10/19 Bcx x2>> 10/19 Urine>>   Plan:   Zosyn 3.375g IV q8h Vanc 1500mg  IV q24

## 2012-04-05 NOTE — Progress Notes (Signed)
[facility] ROUNDING NOTE  SUBJECTIVE:  Patient remains intubatedand sedated  Bedside trach 10/17  CRT stopped 10/16  Excellent UOP -  5.2 liters out yesterday  OBJECTIVE: Temp:  [99 F (37.2 C)-101.4 F (38.6 C)] 101.4 F (38.6 C) (10/19 0400) Pulse Rate:  [76-83] 83  (10/18 1233) Cardiac Rhythm:  [-] Normal sinus rhythm (10/19 0400) Resp:  [12-28] 24  (10/19 0400) BP: (110-145)/(50-82) 139/63 mmHg (10/19 0400) SpO2:  [97 %-100 %] 100 % (10/19 0400) FiO2 (%):  [40 %] 40 % (10/19 0401) Weight:  [111 kg (244 lb 11.4 oz)] 111 kg (244 lb 11.4 oz) (10/19 0300)  BP 139/63  Pulse 83  Temp 101.4 F (38.6 C) (Core (Comment))  Resp 24  Ht 6\' 1"  (1.854 m)  Wt 111 kg (244 lb 11.4 oz)  BMI 32.29 kg/m2  SpO2 100% 04/05/12 0300 111 kg (244 lb 11.4 oz) 04/04/12 0500 115.3 kg (254 lb 3.1 oz) 04/03/12 0500 116.5 kg (256 lb 13.4 oz)  04/02/12 0443 118.4 kg (261 lb 0.4 oz) Physical Examination: Remains sinus rhythm Trached, on vent, sedated Coarse breath sounds ant SR on tele rate 70's S1S2 no S3 no rub Abdomen non dist  + BS Pedal edema, no pretibial or post thigh edema LABS: Basic Metabolic Panel:  Basename 04/05/12 0400 04/04/12 0422  NA 151* 145  K 5.5* 4.7  CL 116* 111  CO2 26 23  GLUCOSE 159* 167*  BUN 82* 78*  CREATININE 2.61* 2.82*  CALCIUM 9.0 8.6  MG -- 2.2  PHOS 6.3* 7.5*   Liver Function Tests:  Basename 04/05/12 0400 04/04/12 0422 04/03/12 0440  AST -- -- 30  ALT -- -- 46  ALKPHOS -- -- 77  BILITOT -- -- 0.2*  PROT -- -- 5.9*  ALBUMIN 2.4* 2.2* --   CBC:  Basename 04/05/12 0500 04/04/12 0422  WBC 7.6 9.5  NEUTROABS -- --  HGB 8.4* 8.3*  HCT 26.4* 25.8*  MCV 93.6 92.5  PLT 143* 150   ABG    Component Value Date/Time   PHART 7.351 04/05/2012 0413   PCO2ART 46.7* 04/05/2012 0413   PO2ART 118.0* 04/05/2012 0413   HCO3 24.6* 04/05/2012 0413   TCO2 26.0 04/05/2012 0413   ACIDBASEDEF 1.0 03/31/2012 0526   O2SAT 99.0 04/05/2012 0413      Medications . albuterol-ipratropium  6 puff Inhalation Q4H  . amiodarone  200 mg Per Tube Daily  . antiseptic oral rinse  1 application Mouth Rinse QID  . chlorhexidine  15 mL Mouth/Throat BID  . etomidate  40 mg Intravenous Once  . feeding supplement (OXEPA)  1,000 mL Per Tube Q24H  . feeding supplement  60 mL Per Tube TID  . heparin subcutaneous  5,000 Units Subcutaneous Q8H  . hydrALAZINE  25 mg Per Tube Q8H  . insulin aspart  2-6 Units Subcutaneous Q4H  . levofloxacin (LEVAQUIN) IV  500 mg Intravenous Q48H  . LORazepam  2 mg Intravenous Q6H  . methadone  20 mg Per Tube Q6H  . pantoprazole sodium  40 mg Per Tube QHS  . propofol      . risperiDONE  2 mg Oral BID  . sodium chloride  1,000 mL Intravenous Once  . sodium polystyrene  30 g Per Tube Once  . DISCONTD: albuterol  2.5 mg Nebulization Q4H  . DISCONTD: feeding supplement (OXEPA)  1,000 mL Per Tube Q24H  . DISCONTD: hydrocortisone sod succinate (SOLU-CORTEF) injection  25 mg Intravenous Q8H  . DISCONTD:  ipratropium  0.5 mg Nebulization Q4H  . DISCONTD: LORazepam  2 mg Intravenous Q8H  . DISCONTD: methadone  15 mg Per Tube Q8H    Infusions . sodium chloride 20 mL/hr at 04/03/12 1515  . dexmedetomidine 1 mcg/kg/hr (04/05/12 0600)  . fentaNYL infusion INTRAVENOUS 200 mcg/hr (04/05/12 0000)  . midazolam (VERSED) infusion 9 mg/hr (04/04/12 1009)  . DISCONTD: dexmedetomidine 1 mcg/kg/hr (04/04/12 1853)  . DISCONTD: propofol 34.063 mcg/kg/min (04/04/12 1009)   ASSESSMENT/RECOMMENDATIONS  51 yo WM with AKI in the setting of pneumococcal pneumonia/sepsis, acute respiratory failure/ARDS/suspected underlying COPD, septic shock (off pressors), NSTEMI, Afib on amio (SR now), diabetes; most recent baseline creatinine 1.07 in 2010.   AKI  CRRT 10/10 - 10/16  Excellent UOP since d/c of CRRT  Weight down 7 kg in past week Potassium up intermittently - CCM treating with kayexelate- consider use of lower potassium TF if available    Creatinine leveling off around 2.6 since D/C of CRRT Has developed hypernatremia with free water deficit - will require additional waster via tube (ordered 200 Q6H) Should not require additional RRT Can pull catheter if you desire and not needed for other infusions Have spoken with Dr. Delford Field - will sign off but call if further questions  Camille Bal, MD  [facility]  [phone] Pager

## 2012-04-05 NOTE — Progress Notes (Addendum)
Name: Gregory [name] MRN: [id number] DOB: 06-21-1960    LOS: 11  Referring Provider:  EDP Reason for Referral:  Acute respiratory failure  PULMONARY / CRITICAL CARE MEDICINE  Brief patient description:  51 yo, hx obesity, presumed OHS, COPD, DM, brought to [facility] ED on 10/8 with hypotension and respiratory distress and intubated for acute respiratory failure and diffuse B infiltrates.   Events Since Admission: 10/8  Brought to [facility] ED on 10/8 in respiratory distress and intubated for B infiltrates, shock 10/9 placed on ARDS 10/9 Positive blood cultures reported 10/10 HD cath placed, CVVH started 10/13 Pressors off 10/14 htn, labetolol 10/16 CVVH off 10/17 Trach placement  Current Status: Critical  Subjective:  S/p trach. Continues to require sedation. Febrile again  Vital Signs: Temp:  [99 F (37.2 C)-102 F (38.9 C)] 102 F (38.9 C) (10/19 0700) Pulse Rate:  [81-83] 83  (10/18 1233) Resp:  [12-28] 28  (10/19 0900) BP: (110-145)/(50-82) 135/60 mmHg (10/19 0700) SpO2:  [97 %-100 %] 98 % (10/19 0851) FiO2 (%):  [40 %] 40 % (10/19 0851) Weight:  [111 kg (244 lb 11.4 oz)] 111 kg (244 lb 11.4 oz) (10/19 0300)  Intake/Output Summary (Last 24 hours) at 04/05/12 1015 Last data filed at 04/05/12 0700  Gross per 24 hour  Intake   1972 ml  Output   5025 ml  Net  -3053 ml   Physical Examination: General:  Mechanically ventilated Neuro:  Sedated, nonfocal HEENT:  NGT in place. Edema left eye improved. Trach without edema or irritation, small amount of dried blood Cardiovascular: RRR Lungs:  Bilateral air entry,  coarse throughout  Abdomen:  Soft, nontender,  +BS Musculoskeletal:  Atraumatic Skin:  No rash. Dry skin on feet  Active Problems:  Pneumococcal pneumonia  Acute respiratory failure  Septic shock(785.52)  Diabetes  Hyperglycemia  Acute encephalopathy  Acute renal failure  ASSESSMENT AND PLAN  PULMONARY  Lab 04/05/12 0413 04/04/12 0430 04/02/12 0457 04/01/12 1342  03/31/12 0526  PHART 7.351 7.389 7.395 7.352 7.295*  PCO2ART 46.7* 46.0* 46.0* 48.3* 53.0*  PO2ART 118.0* 101.0* 93.0 101.0* 79.0*  HCO3 24.6* 27.7* 28.3* 27.0* 26.0*  O2SAT 99.0 98.0 97.0 98.0 94.0   Ventilator Settings: Vent Mode:  [-] PRVC FiO2 (%):  [40 %] 40 % Set Rate:  [28 bmp] 28 bmp Vt Set:  [470 mL] 470 mL PEEP:  [5 cmH20] 5 cmH20 Plateau Pressure:  [12 cmH20-16 cmH20] 14 cmH20  CXR:  10/19> Trach midline. Bilateral infiltrates, LLL continues to be more focally consolidated ETT:  10/8 >>>10/17 Tracheostomy (df): 10/17>>> Bedside u/s for eval of L pleural effusion: 10/11>> Only small amount of fluid. Not enough to sample  A:  Acute respiratory failure.  Pneumonia (CAP vs. Viral) +/- ARDS.  Less likely pulmonary edema.  Suspected COPD (without evidence of exacerbation) - smokes 2-3 packs of cigarettes per day. P:   - ARDS protocol 10/9. Cont Vt at 6cc/kg ( ).  - Trach collar trials when MS allows - Con't combivent scheduled -- Neg balance goal not met but closer to goal; net positive 233 -consider SBT cpa 5 ps 5-10, apnea, tv alarms in setting high sedation needs -No trach collar this am   CARDIOVASCULAR No results found for this basename: TROPONINI:5,LATICACIDVEN:5, O2SATVEN:5,PROBNP:5 in the last 168 hours ECG:  10/16 NSR on monitor, rate controlled in 60's Lines: L IJ TLC 10/8 >>> R HD Cath 10/10>> A line L radial 10/13>> TTE: Mild LVH. Systolic function was normal. The estimated ejection fraction  was in the range of 50% to 55%.  A: Septic Shock.  Less likely acute systolic congestive heart failure with cardiogenic shock.  NSTEMI likely secondary to shock, hypoxemia. - Troponin plateau  Afib 10/9 >> started on amio P:  - Amiodarone for Afib PO per tube 200 daily, consider dc 04/06/12 days if remains sinus - Continue Hydralazine 25mg  TID with Labetalol 5mg  q2 prn with hold parameters - Cortisol level 11 >> hydrocort initiated 10/9. Taper started 10/14, now off  steroids  RENAL  Lab 04/05/12 0400 04/04/12 0422 04/03/12 1647 04/03/12 1212 04/03/12 0440 04/02/12 0450 03/31/12 0400 03/30/12 0410  NA 151* 145 145 142 140 -- -- --  K 5.5* 4.7 -- -- -- -- -- --  CL 116* 111 117* 107 106 -- -- --  CO2 26 23 18* 22 24 -- -- --  BUN 82* 78* 58* 67* 64* -- -- --  CREATININE 2.61* 2.82* 2.20* 2.75* 2.60* -- -- --  CALCIUM 9.0 8.6 6.4* 8.3* 8.4 -- -- --  MG -- 2.2 -- -- -- -- 2.8* 2.7*  PHOS 6.3* 7.5* 5.6* -- -- 4.5 5.4* --   Intake/Output      10/18 0701 - 10/19 0700 10/19 0701 - 10/20 0700   I.V. (mL/kg) 1562.1 (14.1)    NG/GT 760    Total Intake(mL/kg) 2322.1 (20.9)    Urine (mL/kg/hr) 5225 (2)    Stool 600    Total Output 5825    Net -3502.9          Foley:  10/8 >>>  A:  Dehydration.  ARF likely ATN improved  Hypomagnesemia>>resolved Hypophosphatemia>>hyperphosphatemia Hyperkalemia- Resolved Started on CVVH 10/10>>10/16 I/O neg 3L 10/18  P:   - s/p CVVH. Renal following. No indication for intermittent HD now,  - Monitor UOP, improving - Goal CVP 10 to12.  - Trend BMet, follow K+ -  GASTROINTESTINAL  Lab 04/05/12 0400 04/04/12 0422 04/03/12 1647 04/03/12 0440 04/02/12 0450 04/01/12 0440 03/31/12 0400 03/30/12 1830  AST -- -- -- 30 38* 43* 54* 58*  ALT -- -- -- 46 58* 67* 85* 86*  ALKPHOS -- -- -- 77 88 97 108 110  BILITOT -- -- -- 0.2* 0.3 0.3 0.3 0.3  PROT -- -- -- 5.9* 6.0 6.0 6.2 6.2  ALBUMIN 2.4* 2.2* 1.6* 2.3* 2.3* -- -- --   A:  Suspected malnutrition. LFT elevation P:   - LFTs back to normal - TF advanced to goal 10/14, tolerating.  - Having bowel movements after kayxlate>>noted seed material in bowels  HEMATOLOGIC  Lab 04/05/12 0500 04/04/12 0422 04/03/12 0434 04/02/12 0450 04/01/12 0440  HGB 8.4* 8.3* 9.0* 9.8* 9.8*  HCT 26.4* 25.8* 26.7* 29.4* 29.6*  PLT 143* 150 130* 142* 131*  INR -- -- -- -- 1.02  APTT -- -- 29 -- --   HIT neg  A:  Thrombocytopenia, etiology unclear, dilutional P:  - Coags wnl -  Platelets are improving; Restart sub q heparin - Trend CBC  Neg balance is goal  INFECTIOUS  Lab 04/05/12 0500 04/04/12 0422 04/03/12 0434 04/02/12 0450 04/01/12 0440 03/31/12 0400  WBC 7.6 9.5 7.4 9.8 12.7* --  PROCALCITON -- -- -- -- -- 2.86   Cultures: 10/8 urine>>no growth 10/8  Blood >>> GPC in pairs >> Strep pneumo 10/8  Respiratory >>> Strep pneumo 10/9 nasal influ>> neg 10/18 BCx2>>> 10/18 resp c/s>>> 10/18 UC >>>   Antibiotics: Azithromycin 10/8 x 1 Ceftriaxone 10/8 x 1 Zosyn 10/8 >>> 10/11 Vancomycin  10/8 >>> 10/11 Levaquin 10/8 (has best MIC vs pneumococcus at this hospital, higher than ceftriaxone) >>> 10/19 Tamiflu 10/9>>10/10 10/19 zosyn (?hcap) 10/19 vanco (?hcap)  A:  Pneumonia (CAP vs aspiration),  pneumococcal PNA/sepsis, now febrile again P:   - expand to Hcap coverage -d/c levaquin -new set of cultures  ENDOCRINE  Lab 04/05/12 0738 04/05/12 0425 04/05/12 0009 04/04/12 1937 04/04/12 1539  GLUCAP 186* 146* 171* 170* 143*   A:  DM. P:   - ICU glycemic control protocol -   NEUROLOGIC QTC 414 last 10/18 Tg elevated A:  Acute encephalopathy.  History of diabetic neuropathy. Severe agitation. Now off propofol and on precedex and fentanyl  P:   - Goal RASS 0 to -1, difficult. Massive sedation needs - Daily WUA - Con't Propofol and fentanyl gtt, wean as tolerated.  - Need to d/c Propofol when able due to hypertriglyceridemia  - Con't Versed scheduled - Con't Risperdal 2 q12h - Started methadone 10 mg q8h on 10/16>> Increased to 20 q6h ( pls note excellent output and daily qtc 414) - Ativan 2mg  q8 scheduled, increase q6h - Monitor daily EKG reviewed -cont precedex  BEST PRACTICE / DISPOSITION Level of Care:  ICU Primary Service:  PCCM Consultants:  None Code Status:  Full Diet:  tube feeds at goal DVT Px:  SCD GI Px:  Protonix Skin Integrity:  Intact Social / Family: Daughters are points of contact. noone at bedside this am   CC 40  min  Caryl Bis  [phone]  Cell  [phone]  If no response or cell goes to voicemail, call beeper [phone]  04/05/2012 10:15 AM

## 2012-04-06 ENCOUNTER — Inpatient Hospital Stay (HOSPITAL_COMMUNITY): Payer: 59

## 2012-04-06 LAB — RENAL FUNCTION PANEL
Albumin: 2.3 g/dL — ABNORMAL LOW (ref 3.5–5.2)
Calcium: 9.2 mg/dL (ref 8.4–10.5)
GFR calc Af Amer: 35 mL/min — ABNORMAL LOW (ref >90)
Glucose, Bld: 158 mg/dL — ABNORMAL HIGH (ref 70–99)
Phosphorus: 5 mg/dL — ABNORMAL HIGH (ref 2.3–4.6)
Potassium: 4.2 mEq/L (ref 3.5–5.1)
Sodium: 160 mEq/L — ABNORMAL HIGH (ref 135–145)

## 2012-04-06 LAB — CBC
HCT: 27.1 % — ABNORMAL LOW (ref 39.0–52.0)
HCT: 31.1 % — ABNORMAL LOW (ref 39.0–52.0)
Hemoglobin: 8.2 g/dL — ABNORMAL LOW (ref 13.0–17.0)
MCH: 29.3 pg (ref 26.0–34.0)
MCH: 28.9 pg (ref 26.0–34.0)
MCHC: 30.3 g/dL (ref 30.0–36.0)
MCV: 96.6 fL (ref 78.0–100.0)
RBC: 3.22 MIL/uL — ABNORMAL LOW (ref 4.22–5.81)
RDW: 14.9 % (ref 11.5–15.5)
WBC: 7.8 10*3/uL (ref 4.0–10.5)

## 2012-04-06 LAB — GLUCOSE, CAPILLARY
Glucose-Capillary: 139 mg/dL — ABNORMAL HIGH (ref 70–99)
Glucose-Capillary: 143 mg/dL — ABNORMAL HIGH (ref 70–99)
Glucose-Capillary: 150 mg/dL — ABNORMAL HIGH (ref 70–99)
Glucose-Capillary: 167 mg/dL — ABNORMAL HIGH (ref 70–99)

## 2012-04-06 LAB — URINE CULTURE
Colony Count: NO GROWTH
Special Requests: NORMAL

## 2012-04-06 LAB — BASIC METABOLIC PANEL
CO2: 28 mEq/L (ref 19–32)
Chloride: 125 mEq/L — ABNORMAL HIGH (ref 96–112)
Glucose, Bld: 144 mg/dL — ABNORMAL HIGH (ref 70–99)
Sodium: 162 mEq/L (ref 135–145)

## 2012-04-06 MED ORDER — RISPERIDONE 1 MG/ML PO SOLN
3.0000 mg | Freq: Two times a day (BID) | ORAL | Status: DC
  Administered 2012-04-06 – 2012-04-07 (×3): 3 mg via ORAL
  Filled 2012-04-06 (×5): qty 3

## 2012-04-06 MED ORDER — IPRATROPIUM-ALBUTEROL 18-103 MCG/ACT IN AERO
6.0000 | INHALATION_SPRAY | Freq: Four times a day (QID) | RESPIRATORY_TRACT | Status: DC
  Administered 2012-04-06 – 2012-04-10 (×16): 6 via RESPIRATORY_TRACT

## 2012-04-06 MED ORDER — TRAZODONE HCL 150 MG PO TABS
150.0000 mg | ORAL_TABLET | Freq: Every day | ORAL | Status: DC
  Administered 2012-04-06 – 2012-04-07 (×2): 150 mg via ORAL
  Filled 2012-04-06 (×3): qty 1

## 2012-04-06 MED ORDER — FENTANYL CITRATE 0.05 MG/ML IJ SOLN
50.0000 ug | INTRAMUSCULAR | Status: DC | PRN
  Administered 2012-04-06 – 2012-04-07 (×3): 100 ug via INTRAVENOUS
  Administered 2012-04-08 – 2012-04-09 (×3): 50 ug via INTRAVENOUS
  Filled 2012-04-06 (×5): qty 2

## 2012-04-06 MED ORDER — WHITE PETROLATUM GEL
Status: AC
  Administered 2012-04-06: 17:00:00
  Filled 2012-04-06: qty 5

## 2012-04-06 NOTE — Progress Notes (Signed)
eLink Physician-Brief Progress Note Patient Name: Gregory [name] DOB: 14-May-1961 MRN: [id number]  Date of Service  04/06/2012   HPI/Events of Note  inspite of methadone, ativan, risperdal   eICU Interventions  Versed, increase fent prn resue home dose of trazodone    Intervention Category Minor Interventions: Agitation / anxiety - evaluation and management  Gregory [name] V. 04/06/2012, 4:14 PM

## 2012-04-06 NOTE — Progress Notes (Signed)
Name: Gregory [name] MRN: [id number] DOB: [birth year]    LOS: 12  Referring Provider:  EDP Reason for Referral:  Acute respiratory failure  PULMONARY / CRITICAL CARE MEDICINE  Brief patient description:  51 yo, hx obesity, presumed OHS, COPD, DM, brought to [facility] ED on 10/8 with hypotension and respiratory distress and intubated for acute respiratory failure and diffuse B infiltrates.   Events Since Admission: 10/8  Brought to [facility] ED on 10/8 in respiratory distress and intubated for B infiltrates, shock 10/9 placed on ARDS 10/9 Positive blood cultures reported 10/10 HD cath placed, CVVH started 10/13 Pressors off 10/14 htn, labetolol 10/16 CVVH off 10/17 Trach placement 10/20 ATC trials  Current Status: guarded  Subjective:  S/p trach. Now on ATC. Weaning sedation to off   Vital Signs: Temp:  [98.8 F (37.1 C)-100.6 F (38.1 C)] 99.7 F (37.6 C) (10/20 0900) Pulse Rate:  [60-83] 73  (10/20 0740) Resp:  [14-30] 22  (10/20 0900) BP: (103-155)/(52-82) 132/82 mmHg (10/20 0900) SpO2:  [93 %-100 %] 100 % (10/20 0900) FiO2 (%):  [40 %] 40 % (10/20 0800) Weight:  [109.5 kg (241 lb 6.5 oz)] 109.5 kg (241 lb 6.5 oz) (10/20 0500)  Intake/Output Summary (Last 24 hours) at 04/06/12 1108 Last data filed at 04/06/12 0900  Gross per 24 hour  Intake 3108.86 ml  Output   5175 ml  Net -2066.14 ml   Physical Examination: General:  On ATC Neuro:  Sedated, nonfocal HEENT:  NGT in place. Edema left eye improved. Trach without edema or irritation, small amount of dried blood Cardiovascular: RRR Lungs:  Bilateral air entry,  coarse throughout  Abdomen:  Soft, nontender,  +BS Musculoskeletal:  Atraumatic Skin:  No rash. Dry skin on feet  Active Problems:  Pneumococcal pneumonia  Acute respiratory failure  Septic shock(785.52)  Diabetes  Hyperglycemia  Acute encephalopathy  Acute renal failure  ASSESSMENT AND PLAN  PULMONARY  Lab 04/05/12 0413 04/04/12 0430 04/02/12 0457 04/01/12  1342 03/31/12 0526  PHART 7.351 7.389 7.395 7.352 7.295*  PCO2ART 46.7* 46.0* 46.0* 48.3* 53.0*  PO2ART 118.0* 101.0* 93.0 101.0* 79.0*  HCO3 24.6* 27.7* 28.3* 27.0* 26.0*  O2SAT 99.0 98.0 97.0 98.0 94.0   Ventilator Settings: Vent Mode:  [-] PRVC FiO2 (%):  [40 %] 40 % Set Rate:  [28 bmp] 28 bmp Vt Set:  [470 mL] 470 mL PEEP:  [5 cmH20] 5 cmH20 Plateau Pressure:  [14 cmH20-16 cmH20] 14 cmH20  CXR:  10/20 > Trach midline. Bilateral infiltrates, LLL continues to be more focally consolidated ETT:  10/8 >>>10/17 Tracheostomy (df): 10/17>>> Bedside u/s for eval of L pleural effusion: 10/11>> Only small amount of fluid. Not enough to sample  A:  Acute respiratory failure.  Pneumonia (CAP vs. Viral) +/- ARDS.  Less likely pulmonary edema.  Suspected COPD (without evidence of exacerbation) - smokes 2-3 packs of cigarettes per day. P:   --push ATC trials   CARDIOVASCULAR No results found for this basename: TROPONINI:5,LATICACIDVEN:5, O2SATVEN:5,PROBNP:5 in the last 168 hours ECG:  10/16 NSR on monitor, rate controlled in 60's Lines: L IJ TLC 10/8 >>> R HD Cath 10/10>>10/20 A line L radial 10/13>>d/c TTE: Mild LVH. Systolic function was normal. The estimated ejection fraction was in the range of 50% to 55%.  A: Septic Shock.  Less likely acute systolic congestive heart failure with cardiogenic shock.  NSTEMI likely secondary to shock, hypoxemia. - Troponin plateau  Afib 10/9 >> started on amio>>10/20 on PO amio P:  - Amiodarone  for Afib PO per tube 200 daily  - Continue Hydralazine 25mg  TID with Labetalol 5mg  q2 prn with hold parameters - now off steroids  RENAL  Lab 04/06/12 0452 04/05/12 0400 04/04/12 0422 04/03/12 1647 04/03/12 1212 04/02/12 0450 03/31/12 0400  NA 160* 151* 145 145 142 -- --  K 4.2 5.5* -- -- -- -- --  CL 123* 116* 111 117* 107 -- --  CO2 28 26 23  18* 22 -- --  BUN 77* 82* 78* 58* 67* -- --  CREATININE 2.37* 2.61* 2.82* 2.20* 2.75* -- --  CALCIUM 9.2 9.0  8.6 6.4* 8.3* -- --  MG -- -- 2.2 -- -- -- 2.8*  PHOS 5.0* 6.3* 7.5* 5.6* -- 4.5 --   Intake/Output      10/19 0701 - 10/20 0700 10/20 0701 - 10/21 0700   I.V. (mL/kg) 1479.4 (13.5) 94.7 (0.9)   Other 1000    NG/GT 1710 20   IV Piggyback 400    Total Intake(mL/kg) 4589.4 (41.9) 114.7 (1)   Urine (mL/kg/hr) 4875 (1.9) 600 (1.3)   Stool 400    Total Output 5275 600   Net -685.6 -485.3         Foley:  10/8 >>>  A:  Dehydration.  ARF likely ATN improved  Hypomagnesemia>>resolved Hyperkalemia- Resolved Started on CVVH 10/10>>10/16  P:   - take out HD catheter   GASTROINTESTINAL  Lab 04/06/12 0452 04/05/12 0400 04/04/12 0422 04/03/12 1647 04/03/12 0440 04/02/12 0450 04/01/12 0440 03/31/12 0400 03/30/12 1830  AST -- -- -- -- 30 38* 43* 54* 58*  ALT -- -- -- -- 46 58* 67* 85* 86*  ALKPHOS -- -- -- -- 77 88 97 108 110  BILITOT -- -- -- -- 0.2* 0.3 0.3 0.3 0.3  PROT -- -- -- -- 5.9* 6.0 6.0 6.2 6.2  ALBUMIN 2.3* 2.4* 2.2* 1.6* 2.3* -- -- -- --   A:  Suspected malnutrition. LFT elevation P:   - LFTs back to normal - TF advanced to goal 10/14, tolerating.   HEMATOLOGIC  Lab 04/06/12 0452 04/05/12 0500 04/04/12 0422 04/03/12 0434 04/02/12 0450 04/01/12 0440  HGB 8.2* 8.4* 8.3* 9.0* 9.8* --  HCT 27.1* 26.4* 25.8* 26.7* 29.4* --  PLT 169 143* 150 130* 142* --  INR -- -- -- -- -- 1.02  APTT -- -- -- 29 -- --   HIT neg  A:  Thrombocytopenia, d/t sepsis resolved P:  - Coags wnl - Platelets are improving; Restarted sub q heparin - Trend CBC   INFECTIOUS  Lab 04/06/12 0452 04/05/12 0500 04/04/12 0422 04/03/12 0434 04/02/12 0450 03/31/12 0400  WBC 7.8 7.6 9.5 7.4 9.8 --  PROCALCITON -- -- -- -- -- 2.86   Cultures: 10/8 urine>>no growth 10/8  Blood >>> GPC in pairs >> Strep pneumo 10/8  Respiratory >>> Strep pneumo 10/9 nasal influ>> neg 10/18 BCx2>>> 10/18 resp c/s>>> 10/18 UC >>>   Antibiotics: Azithromycin 10/8 x 1 Ceftriaxone 10/8 x 1 Zosyn 10/8 >>>  10/11 Vancomycin 10/8 >>> 10/11 Levaquin 10/8 (has best MIC vs pneumococcus at this hospital, higher than ceftriaxone) >>> 10/19 Tamiflu 10/9>>10/10 10/19 zosyn (?hcap) 10/19 vanco (?hcap)  A:  Pneumonia (CAP vs aspiration),  pneumococcal PNA/sepsis, now febrile again P:   - expand to Hcap coverage F/u c/s when avail  ENDOCRINE  Lab 04/06/12 0741 04/06/12 0619 04/06/12 0430 04/06/12 0029 04/05/12 1926  GLUCAP 139* 130* 143* 167* 156*   A:  DM. P:   - ICU glycemic  control protocol -   NEUROLOGIC QTC 380 10/20   A:  Acute encephalopathy.  History of diabetic neuropathy. Severe agitation. Now off propofol and on precedex and fentanyl  P:   - -increase  Risperdal 3 q12h -  Cont Methadone   20 q6h - Ativan 2mg   q6h - Monitor daily EKG reviewed -d/c  precedex  BEST PRACTICE / DISPOSITION Level of Care:  ICU Primary Service:  PCCM Consultants:  None Code Status:  Full Diet:  tube feeds at goal DVT Px:  SCD GI Px:  Protonix Skin Integrity:  Intact Social / Family: Daughters are points of contact. noone at bedside this am   CC 40 min  Caryl Bis  [phone]  Cell  [phone]  If no response or cell goes to voicemail, call beeper [phone]  04/06/2012 11:08 AM

## 2012-04-07 ENCOUNTER — Inpatient Hospital Stay (HOSPITAL_COMMUNITY): Payer: 59

## 2012-04-07 LAB — GLUCOSE, CAPILLARY
Glucose-Capillary: 158 mg/dL — ABNORMAL HIGH (ref 70–99)
Glucose-Capillary: 160 mg/dL — ABNORMAL HIGH (ref 70–99)
Glucose-Capillary: 213 mg/dL — ABNORMAL HIGH (ref 70–99)

## 2012-04-07 LAB — BASIC METABOLIC PANEL
CO2: 27 mEq/L (ref 19–32)
Calcium: 9.5 mg/dL (ref 8.4–10.5)
Chloride: 128 mEq/L — ABNORMAL HIGH (ref 96–112)
Glucose, Bld: 168 mg/dL — ABNORMAL HIGH (ref 70–99)
Potassium: 3.2 mEq/L — ABNORMAL LOW (ref 3.5–5.1)
Sodium: 165 mEq/L (ref 135–145)

## 2012-04-07 LAB — CBC
Hemoglobin: 8.8 g/dL — ABNORMAL LOW (ref 13.0–17.0)
Platelets: 194 10*3/uL (ref 150–400)
RBC: 2.99 MIL/uL — ABNORMAL LOW (ref 4.22–5.81)
WBC: 7.3 10*3/uL (ref 4.0–10.5)

## 2012-04-07 LAB — PHOSPHORUS: Phosphorus: 3.6 mg/dL (ref 2.3–4.6)

## 2012-04-07 LAB — MAGNESIUM: Magnesium: 1.6 mg/dL (ref 1.5–2.5)

## 2012-04-07 MED ORDER — SODIUM CHLORIDE 0.9 % IJ SOLN
10.0000 mL | INTRAMUSCULAR | Status: DC | PRN
  Administered 2012-04-07 – 2012-04-09 (×2): 20 mL
  Administered 2012-04-09 – 2012-04-12 (×4): 10 mL
  Administered 2012-04-12 – 2012-04-14 (×2): 20 mL
  Administered 2012-04-15 (×2): 10 mL

## 2012-04-07 MED ORDER — POTASSIUM CHLORIDE 20 MEQ/15ML (10%) PO LIQD
ORAL | Status: AC
  Filled 2012-04-07: qty 15

## 2012-04-07 MED ORDER — SODIUM CHLORIDE 0.9 % IJ SOLN
10.0000 mL | Freq: Two times a day (BID) | INTRAMUSCULAR | Status: DC
  Administered 2012-04-07 – 2012-04-08 (×2): 10 mL
  Administered 2012-04-08: 20 mL
  Administered 2012-04-09 – 2012-04-13 (×6): 10 mL

## 2012-04-07 MED ORDER — ALTEPLASE 2 MG IJ SOLR
2.0000 mg | Freq: Once | INTRAMUSCULAR | Status: AC
  Administered 2012-04-07: 2 mg
  Filled 2012-04-07: qty 2

## 2012-04-07 MED ORDER — FREE WATER
300.0000 mL | Status: DC
  Administered 2012-04-07 – 2012-04-08 (×6): 300 mL

## 2012-04-07 MED ORDER — FAMOTIDINE 40 MG/5ML PO SUSR
20.0000 mg | Freq: Two times a day (BID) | ORAL | Status: DC
  Administered 2012-04-07 – 2012-04-10 (×8): 20 mg
  Filled 2012-04-07 (×10): qty 2.5

## 2012-04-07 MED ORDER — CIPROFLOXACIN HCL 0.3 % OP SOLN
2.0000 [drp] | OPHTHALMIC | Status: DC
  Administered 2012-04-07 – 2012-04-08 (×5): 2 [drp] via OPHTHALMIC
  Filled 2012-04-07: qty 2.5

## 2012-04-07 MED ORDER — RANITIDINE HCL 150 MG/10ML PO SYRP: 150.0000 mg | ORAL_SOLUTION | Freq: Two times a day (BID) | ORAL | Status: DC

## 2012-04-07 MED ORDER — POTASSIUM CHLORIDE 20 MEQ/15ML (10%) PO LIQD
40.0000 meq | Freq: Once | ORAL | Status: AC
  Administered 2012-04-07: 40 meq
  Filled 2012-04-07: qty 30

## 2012-04-07 MED ORDER — LORAZEPAM 2 MG/ML PO CONC
3.0000 mg | Freq: Four times a day (QID) | ORAL | Status: DC
  Administered 2012-04-07 (×2): 3 mg
  Filled 2012-04-07 (×3): qty 2

## 2012-04-07 MED ORDER — DEXTROSE 5 % IV SOLN
INTRAVENOUS | Status: DC
  Administered 2012-04-07: 75 mL via INTRAVENOUS
  Administered 2012-04-08: 100 mL via INTRAVENOUS
  Administered 2012-04-09: 01:00:00 via INTRAVENOUS
  Administered 2012-04-09: 100 mL via INTRAVENOUS
  Administered 2012-04-11: 01:00:00 via INTRAVENOUS
  Administered 2012-04-11: 50 mL via INTRAVENOUS

## 2012-04-07 NOTE — Evaluation (Signed)
Occupational Therapy Evaluation Patient Details Name: Gregory [name] MRN: [id number] DOB: 07/11/1960 Today's Date: 04/07/2012 Time: 1040-1103 OT Time Calculation (min): 23 min  OT Assessment / Plan / Recommendation Clinical Impression  This 51 y.o. male admitted with hypotension and respiratory distress - required intubation.  Pt. with septic shock due to PNA.  Pt. underwent tracheostomy and is weaning on trach collar.  Currently pt. with AMS due to encephalopathy.  Follows only occasional one step commands, and demonstrates focused attention.  He will benefit from acute OT for the below listed deficits, and will likely benefit from post acute rehab CIR vs. SNF, vs. LTACH depending on progress    OT Assessment  Patient needs continued OT Services    Follow Up Recommendations  Inpatient Rehab;Skilled nursing facility;LTACH (depending on progress.  Pt. weaning on trach collar)    Barriers to Discharge Other (comment) (uncertain at this time)    Equipment Recommendations  None recommended by OT    Recommendations for Other Services    Frequency  Min 2X/week    Precautions / Restrictions Precautions Precautions: Other (comment) Precaution Comments: multiple lines; trach collar Restrictions Weight Bearing Restrictions: No       ADL  Eating/Feeding: NPO Where Assessed - Eating/Feeding: Chair Grooming: +1 Total assistance Where Assessed - Grooming: Supported sitting Upper Body Bathing: +1 Total assistance Where Assessed - Upper Body Bathing: Supported sitting Lower Body Bathing: +2 Total assistance Lower Body Bathing: Patient Percentage: 0% Where Assessed - Lower Body Bathing: Supported sit to stand Upper Body Dressing: +1 Total assistance Where Assessed - Upper Body Dressing: Supported sitting Lower Body Dressing: Simulated;+2 Total assistance Lower Body Dressing: Patient Percentage: 0% Where Assessed - Lower Body Dressing: Supported sit to Pharmacist, hospital: Architectural technologist: Patient Percentage: 40% Statistician Method: Sit to stand Toileting - Architect and Hygiene: +2 Total assistance Toileting - Clothing Manipulation and Hygiene: Patient Percentage: 0% Where Assessed - Toileting Clothing Manipulation and Hygiene: Standing Transfers/Ambulation Related to ADLs: sit to stand with total A +2 (pt 40%) ADL Comments: Pt. does not initiate any ADLs    OT Diagnosis: Generalized weakness;Cognitive deficits  OT Problem List: Decreased strength;Decreased activity tolerance;Impaired balance (sitting and/or standing);Decreased cognition;Decreased safety awareness;Decreased knowledge of use of DME or AE;Cardiopulmonary status limiting activity;Obesity OT Treatment Interventions: [name]-care/ADL training;Therapeutic exercise;DME and/or AE instruction;Therapeutic activities;Patient/family education;Balance training   OT Goals Acute Rehab OT Goals OT Goal Formulation: With family Time For Goal Achievement: 04/21/12 Potential to Achieve Goals: Good ADL Goals Pt Will Perform Grooming: with min assist;Sitting, chair ADL Goal: Grooming - Progress: Goal set today Pt Will Perform Upper Body Bathing: with mod assist;Sitting, chair ADL Goal: Upper Body Bathing - Progress: Goal set today Pt Will Perform Lower Body Bathing: with mod assist;Sit to stand from chair ADL Goal: Lower Body Bathing - Progress: Goal set today Pt Will Transfer to Toilet: with mod assist;3-in-1 ADL Goal: Toilet Transfer - Progress: Goal set today Additional ADL Goal #1: Pt. will participate in 15 mins therapeutic activity with 3 rest breaks ADL Goal: Additional Goal #1 - Progress: Goal set today Additional ADL Goal #2: Pt. will maintain sustained attention x 3 mins during familiar ADL activity  Visit Information  Last OT Received On: 04/07/12 Assistance Needed: +2 PT/OT Co-Evaluation/Treatment: Yes    Subjective Data      Prior Functioning     Home  Living Lives With: Alone Available Help at Discharge: Other (Comment) (unknown at present) Type of Home: Mobile home  Home Access: Stairs to enter Entrance Stairs-Number of Steps: 3-4 Entrance Stairs-Rails: Right (may have both - dtr is uncertain) Home Layout: One level Bathroom Shower/Tub: Health visitor: Standard Home Adaptive Equipment: None Prior Function Level of Independence: Independent Able to Take Stairs?: Yes Driving: Yes 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 and Rt   Cognition  Overall Cognitive Status: Impaired Area of Impairment: Attention Arousal/Alertness: Lethargic Orientation Level: Other (comment) (unable to asses) Behavior During Session: Lethargic Current Attention Level: Focused Following Commands: Follows one step commands inconsistently (followed 2 one step commands, and answered yes x 2)    Extremity/Trunk Assessment Right Upper Extremity Assessment RUE ROM/Strength/Tone: Unable to fully assess;Due to impaired cognition Left Upper Extremity Assessment LUE ROM/Strength/Tone: Unable to fully assess;Due to impaired cognition     Mobility Transfers Transfers: Sit to Stand;Stand to Sit Sit to Stand: 1: +2 Total assist;From chair/3-in-1 Sit to Stand: Patient Percentage: 40% Stand to Sit: 1: +2 Total assist;To chair/3-in-1 Stand to Sit: Patient Percentage: 40% Details for Transfer Assistance: Pt. leans heavily to Lt.     Shoulder Instructions     Exercise     Balance Balance Balance Assessed: Yes Static Sitting Balance Static Sitting - Balance Support: Feet supported Static Sitting - Level of Assistance: 3: Mod assist (min guard assist - mod A (variable)) Static Sitting - Comment/# of Minutes: Pt. sat EOC x 10 mins. Pt.  rocking back at forth at times.  At times lurches forward   End of Session OT - End of Session Activity Tolerance: Patient limited by fatigue Patient left: in chair;with call bell/phone within reach;with family/visitor present;with restraints reapplied (bil. hand mittens applied) Nurse Communication: Mobility status  GO     Gregory [name], Gregory [name] 04/07/2012, 12:39 PM

## 2012-04-07 NOTE — Progress Notes (Signed)
CRITICAL VALUE ALERT  Critical value received: Na= 165  Date of notification:  04-07-12  Time of notification:  0621  Critical value read back:yes  Nurse who received alert: Dustin Flock RN  MD notified (1st page): Dr. Molli Knock  Time of first page: [phone]  MD notified (2nd page):  Time of second page:  Responding MD: Dr. Molli Knock  Time MD responded: [phone]

## 2012-04-07 NOTE — Progress Notes (Addendum)
Rehab Admissions Coordinator Note:  Patient was screened by Brock Ra for appropriateness for an Inpatient Acute Rehab Consult.  At this time, we are recommending Inpatient Rehab Consult.  Note Dr. Delford Field also looking into [facility]    Brock Ra 04/07/2012, 3:34 PM  I can be reached at [phone].

## 2012-04-07 NOTE — Progress Notes (Signed)
[facility] ADULT ICU REPLACEMENT PROTOCOL FOR AM LAB REPLACEMENT ONLY  The patient does not apply for the [facility] Adult ICU Electrolyte Replacment Protocol based on the criteria listed below:   1. Is GFR >/= 50 ml/min? no  Patient's GFR today is 32  6. If a panic level lab has been reported, has the CCM MD in charge been notified? yes.   Physician:  Lawerance Sabal [facility] 04/07/2012 6:27 AM

## 2012-04-07 NOTE — Evaluation (Signed)
Physical Therapy Evaluation Patient Details Name: Gregory [name] MRN: [id number] DOB: [birth year] Today's Date: 04/07/2012 Time: 1040-1103 PT Time Calculation (min): 23 min  PT Assessment / Plan / Recommendation Clinical Impression  pt presents with PNA and Sepsis.  pt generally very debilitated and requiring 2 person A with mobility.  pt would benefit from CIR at D/C to maximize Independence.      PT Assessment  Patient needs continued PT services    Follow Up Recommendations  Post acute inpatient    Does the patient have the potential to tolerate intense rehabilitation   Yes, Recommend IP Rehab Screening  Barriers to Discharge None      Equipment Recommendations  None recommended by PT;None recommended by OT    Recommendations for Other Services Rehab consult   Frequency Min 3X/week    Precautions / Restrictions Precautions Precautions: Fall Precaution Comments: multiple lines; trach collar Restrictions Weight Bearing Restrictions: No   Pertinent Vitals/Pain Does not indicate pain.  Pt on Trach Collar 40% on 8L O2 maintaining sats.        Mobility  Bed Mobility Bed Mobility: Not assessed Transfers Transfers: Sit to Stand;Stand to Sit Sit to Stand: 1: +2 Total assist;From chair/3-in-1 Sit to Stand: Patient Percentage: 40% Stand to Sit: 1: +2 Total assist;To chair/3-in-1 Stand to Sit: Patient Percentage: 40% Details for Transfer Assistance: Pt. leans heavily to Lt. Ambulation/Gait Ambulation/Gait Assistance: Not tested (comment) Stairs: No Wheelchair Mobility Wheelchair Mobility: No    Shoulder Instructions     Exercises     PT Diagnosis: Difficulty walking;Generalized weakness  PT Problem List: Decreased strength;Decreased activity tolerance;Decreased balance;Decreased mobility;Decreased cognition;Decreased knowledge of use of DME;Cardiopulmonary status limiting activity PT Treatment Interventions: DME instruction;Gait training;Stair training;Functional  mobility training;Therapeutic activities;Therapeutic exercise;Balance training;Patient/family education   PT Goals Acute Rehab PT Goals PT Goal Formulation: With family Time For Goal Achievement: 04/21/12 Potential to Achieve Goals: Good Pt will go Supine/Side to Sit: with min assist PT Goal: Supine/Side to Sit - Progress: Goal set today Pt will go Sit to Supine/Side: with min assist PT Goal: Sit to Supine/Side - Progress: Goal set today Pt will go Sit to Stand: with min assist PT Goal: Sit to Stand - Progress: Goal set today Pt will go Stand to Sit: with min assist PT Goal: Stand to Sit - Progress: Goal set today Pt will Ambulate: 16 - 50 feet;with min assist;with rolling walker PT Goal: Ambulate - Progress: Goal set today  Visit Information  Last PT Received On: 04/07/12 Assistance Needed: +2 PT/OT Co-Evaluation/Treatment: Yes    Subjective Data  Subjective: pt with no verbalizations secondary to trach, but nods head yes/no appropriate.   Patient Stated Goal: None stated.     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: Tracheostomy    Cognition  Overall Cognitive Status: Impaired Area of Impairment: Attention Arousal/Alertness: Lethargic Orientation Level: Other (comment) (unable to asses) Behavior During Session: Lethargic Current Attention Level: Focused Following Commands: Follows one step commands inconsistently (followed 2 one step commands,  and answered yes x 2)    Extremity/Trunk Assessment  Right Upper Extremity Assessment RUE ROM/Strength/Tone: Unable to fully assess;Due to impaired cognition Left Upper Extremity Assessment LUE ROM/Strength/Tone: Unable to fully assess;Due to impaired cognition Right Lower Extremity Assessment RLE ROM/Strength/Tone: Deficits RLE ROM/Strength/Tone Deficits: pt not following directions for MMT, however able to WB on R LE and spontaneously perorms ankle pumps.   Left Lower Extremity Assessment LLE ROM/Strength/Tone: Deficits LLE ROM/Strength/Tone Deficits: Unable to test secondary to cognition, but seems weaker than R LE and unable to provide much support during WBing, with L knee buckling, but does spontaneously perform APs.     Balance Balance Balance Assessed: Yes Static Sitting Balance Static Sitting - Balance Support: Feet supported Static Sitting - Level of Assistance: 3: Mod assist (min guard assist - mod A (variable)) Static Sitting - Comment/# of Minutes: pt able to sit edge of chair ~25mins and at times rocks back and forth and occasionally lurched forwards.    End of Session PT - End of Session Equipment Utilized During Treatment: Oxygen Activity Tolerance: Patient limited by fatigue Patient left: in chair;with call bell/phone within reach;with family/visitor present Nurse Communication: Mobility status  GP     Sunny Schlein, [place] [phone] 04/07/2012, 2:40 PM

## 2012-04-07 NOTE — Progress Notes (Signed)
Name: Gregory [name] MRN: [id number] DOB: 1961-05-21    LOS: 13  Referring Provider:  EDP Reason for Referral:  Acute respiratory failure  PULMONARY / CRITICAL CARE MEDICINE  Brief patient description:  51 yo, hx obesity, presumed OHS, COPD, DM, brought to [facility] ED on 10/8 with hypotension and respiratory distress and intubated for acute respiratory failure and diffuse B infiltrates.   Events Since Admission: 10/8  Brought to [facility] ED on 10/8 in respiratory distress and intubated for B infiltrates, shock 10/9 placed on ARDS 10/9 Positive blood cultures reported 10/10 HD cath placed, CVVH started 10/13 Pressors off 10/14 htn, labetolol 10/16 CVVH off 10/17 Trach placement 10/20 ATC trials  Current Status: guarded  Subjective:  S/p trach. Now on ATC. Off continuous sedation  Vital Signs: Temp:  [99.7 F (37.6 C)-101.7 F (38.7 C)] 100.1 F (37.8 C) (10/21 0600) Pulse Rate:  [112-121] 112  (10/21 1147) Resp:  [17-29] 28  (10/21 1147) BP: (96-178)/(48-96) 128/84 mmHg (10/21 1147) SpO2:  [92 %-100 %] 94 % (10/21 1147) FiO2 (%):  [40 %] 40 % (10/21 1147) Weight:  [106.9 kg (235 lb 10.8 oz)] 106.9 kg (235 lb 10.8 oz) (10/21 0500)  Intake/Output Summary (Last 24 hours) at 04/07/12 1238 Last data filed at 04/07/12 0654  Gross per 24 hour  Intake  957.6 ml  Output   4495 ml  Net -3537.4 ml   Physical Examination: General:  On ATC Neuro:  Less sedated and nonfocal HEENT:  NGT in place. Edema left eye improved but still with significant conjunctivitis. Trach without edema or irritation, small amount of dried blood Cardiovascular: RRR Lungs:  Bilateral air entry,  coarse throughout  Abdomen:  Soft, nontender,  +BS Musculoskeletal:  Atraumatic Skin:  No rash. Dry skin on feet  Active Problems:  Pneumococcal pneumonia  Acute respiratory failure  Septic shock(785.52)  Diabetes  Hyperglycemia  Acute encephalopathy  Acute renal failure  ASSESSMENT AND PLAN  PULMONARY  Lab  04/05/12 0413 04/04/12 0430 04/02/12 0457 04/01/12 1342  PHART 7.351 7.389 7.395 7.352  PCO2ART 46.7* 46.0* 46.0* 48.3*  PO2ART 118.0* 101.0* 93.0 101.0*  HCO3 24.6* 27.7* 28.3* 27.0*  O2SAT 99.0 98.0 97.0 98.0   Ventilator Settings: Vent Mode:  [-] PRVC FiO2 (%):  [40 %] 40 % Set Rate:  [28 bmp] 28 bmp Vt Set:  [470 mL] 470 mL PEEP:  [5 cmH20] 5 cmH20 Plateau Pressure:  [14 cmH20] 14 cmH20  CXR:  10/20 > Trach midline. Bilateral infiltrates, LLL continues to be more focally consolidated ETT:  10/8 >>>10/17 Tracheostomy (df): 10/17>>>  A:  Acute respiratory failure.  Pneumonia (CAP vs. Viral) +/- ARDS.  Less likely pulmonary edema.  Suspected COPD (without evidence of exacerbation) - smokes 2-3 packs of cigarettes per day. P:   --push ATC trials -referral to LTAC   CARDIOVASCULAR No results found for this basename: TROPONINI:5,LATICACIDVEN:5, O2SATVEN:5,PROBNP:5 in the last 168 hours ECG:  10/16 NSR on monitor, rate controlled in 60's Lines: L IJ TLC 10/8 >>> R HD Cath 10/10>>10/20 A line L radial 10/13>>d/c TTE: Mild LVH. Systolic function was normal. The estimated ejection fraction was in the range of 50% to 55%.  A: Septic Shock.  Less likely acute systolic congestive heart failure with cardiogenic shock.  NSTEMI likely secondary to shock, hypoxemia. - Troponin plateau  Afib 10/9 >> started on amio>>10/20 on PO amio P:  - Amiodarone for Afib PO per tube 200 daily  - Continue Hydralazine 25mg  TID with Labetalol 5mg  q2  prn with hold parameters - now off steroids  RENAL  Lab 04/07/12 0535 04/06/12 1130 04/06/12 0452 04/05/12 0400 04/04/12 0422 04/03/12 1647  NA 165* 162* 160* 151* 145 --  K 3.2* 3.7 -- -- -- --  CL 128* 125* 123* 116* 111 --  CO2 27 28 28 26 23  --  BUN 64* 71* 77* 82* 78* --  CREATININE 2.22* 2.31* 2.37* 2.61* 2.82* --  CALCIUM 9.5 9.3 9.2 9.0 8.6 --  MG 1.6 -- -- -- 2.2 --  PHOS 3.6 -- 5.0* 6.3* 7.5* 5.6*   Intake/Output      10/20 0701 - 10/21  0700 10/21 0701 - 10/22 0700   I.V. (mL/kg) 463.7 (4.3)    Other     NG/GT 630    IV Piggyback 650    Total Intake(mL/kg) 1743.7 (16.3)    Urine (mL/kg/hr) 4695 (1.8)    Stool 800    Total Output 5495    Net -3751.3          Foley:  10/8 >>>  A:  Dehydration.  ARF likely ATN improved >>>renal function improving Severe hypernatremia Hypomagnesemia>>resolved Hypokalemia Started on CVVH 10/10>>10/16  P:   Monitor  Increase free  water   GASTROINTESTINAL  Lab 04/06/12 0452 04/05/12 0400 04/04/12 0422 04/03/12 1647 04/03/12 0440 04/02/12 0450 04/01/12 0440  AST -- -- -- -- 30 38* 43*  ALT -- -- -- -- 46 58* 67*  ALKPHOS -- -- -- -- 77 88 97  BILITOT -- -- -- -- 0.2* 0.3 0.3  PROT -- -- -- -- 5.9* 6.0 6.0  ALBUMIN 2.3* 2.4* 2.2* 1.6* 2.3* -- --   A:  Suspected malnutrition. LFT elevation P:   - LFTs back to normal - TF advanced to goal 10/14, tolerating.   HEMATOLOGIC  Lab 04/07/12 0535 04/06/12 1130 04/06/12 0452 04/05/12 0500 04/04/12 0422 04/03/12 0434 04/01/12 0440  HGB 8.8* 9.3* 8.2* 8.4* 8.3* -- --  HCT 29.2* 31.1* 27.1* 26.4* 25.8* -- --  PLT 194 166 169 143* 150 -- --  INR -- -- -- -- -- -- 1.02  APTT -- -- -- -- -- 29 --     A:  No active heme issues other than anemia of critical illness P:  - transfuse only for Hgb </= 7.0   INFECTIOUS  Lab 04/07/12 0535 04/06/12 1130 04/06/12 0452 04/05/12 0500 04/04/12 0422  WBC 7.3 7.8 7.8 7.6 9.5  PROCALCITON -- -- -- -- --   Cultures: 10/8 urine>>no growth 10/8  Blood >>> GPC in pairs >> Strep pneumo 10/8  Respiratory >>> Strep pneumo 10/9 nasal influ>> neg 10/18 BCx2>>>NGTD>>> 10/18 resp c/s>>>nl flora 10/18 UC >>>neg   Antibiotics: Azithromycin 10/8 x 1 Ceftriaxone 10/8 x 1 Zosyn 10/8 >>> 10/11 Vancomycin 10/8 >>> 10/11 Levaquin 10/8 (has best MIC vs pneumococcus at this hospital, higher than ceftriaxone) >>> 10/19 Tamiflu 10/9>>10/10 10/19 zosyn (?hcap) 10/19 vanco (?hcap)  A:  Pneumonia  (CAP vs aspiration),  pneumococcal PNA/sepsis, now febrile again P:   - cont  Hcap coverage   ENDOCRINE  Lab 04/07/12 0837 04/07/12 0402 04/07/12 0009 04/06/12 1958 04/06/12 1541  GLUCAP 160* 158* 213* 150* 163*   A:  DM. P:   - ICU glycemic control protocol    NEUROLOGIC QTC 410  10/21   A:  Acute encephalopathy.  History of diabetic neuropathy. QTc stable .  Off iv sedation P:   - -cont  Risperdal 3 q12h -  Cont Methadone   20  q6h - change to PER tube Ativan 3 mg  q6h  BEST PRACTICE / DISPOSITION Level of Care:  ICU Primary Service:  PCCM Consultants:  None Code Status:  Full Diet:  tube feeds at goal DVT Px:  SCD GI Px:  Protonix Skin Integrity:  Intact Social / Family: Daughters are points of contact. noone at bedside this am   CC 40 min  Caryl Bis  [phone]  Cell  [phone]  If no response or cell goes to voicemail, call beeper [phone]  04/07/2012 12:38 PM

## 2012-04-07 NOTE — Progress Notes (Signed)
Pt still on tc at 40% at this time. Saturations at 97-98%

## 2012-04-07 NOTE — Progress Notes (Signed)
Pt on tc at 40% at this time.  If saturations stay inm upper 90's, may try to keep him off vent for tonight if tolerated.

## 2012-04-08 ENCOUNTER — Inpatient Hospital Stay (HOSPITAL_COMMUNITY): Payer: 59

## 2012-04-08 DIAGNOSIS — R4182 Altered mental status, unspecified: Secondary | ICD-10-CM

## 2012-04-08 LAB — BASIC METABOLIC PANEL
Calcium: 9 mg/dL (ref 8.4–10.5)
GFR calc Af Amer: 39 mL/min — ABNORMAL LOW (ref >90)
GFR calc non Af Amer: 32 mL/min — ABNORMAL LOW (ref >90)
GFR calc non Af Amer: 33 mL/min — ABNORMAL LOW (ref >90)
Glucose, Bld: 224 mg/dL — ABNORMAL HIGH (ref 70–99)
Potassium: 3.6 mEq/L (ref 3.5–5.1)
Potassium: 3.2 mEq/L — ABNORMAL LOW (ref 3.5–5.1)
Sodium: 166 mEq/L (ref 135–145)
Sodium: 162 mEq/L (ref 135–145)

## 2012-04-08 LAB — CBC
Hemoglobin: 8.5 g/dL — ABNORMAL LOW (ref 13.0–17.0)
MCH: 29 pg (ref 26.0–34.0)
Platelets: 204 10*3/uL (ref 150–400)
RBC: 2.93 MIL/uL — ABNORMAL LOW (ref 4.22–5.81)
RDW: 15.2 % (ref 11.5–15.5)

## 2012-04-08 LAB — GLUCOSE, CAPILLARY
Glucose-Capillary: 181 mg/dL — ABNORMAL HIGH (ref 70–99)
Glucose-Capillary: 186 mg/dL — ABNORMAL HIGH (ref 70–99)
Glucose-Capillary: 178 mg/dL — ABNORMAL HIGH (ref 70–99)
Glucose-Capillary: 179 mg/dL — ABNORMAL HIGH (ref 70–99)

## 2012-04-08 LAB — CULTURE, RESPIRATORY W GRAM STAIN

## 2012-04-08 MED ORDER — TRAZODONE HCL 150 MG PO TABS
150.0000 mg | ORAL_TABLET | Freq: Every evening | ORAL | Status: DC | PRN
  Filled 2012-04-08: qty 1

## 2012-04-08 MED ORDER — RISPERIDONE 1 MG/ML PO SOLN
1.0000 mg | Freq: Two times a day (BID) | ORAL | Status: DC
  Administered 2012-04-08 – 2012-04-09 (×2): 1 mg via ORAL
  Filled 2012-04-08 (×5): qty 1

## 2012-04-08 MED ORDER — LORAZEPAM 2 MG/ML PO CONC: 2.0000 mg | Freq: Four times a day (QID) | ORAL | Status: DC

## 2012-04-08 MED ORDER — LORAZEPAM 1 MG PO TABS: 2.0000 mg | ORAL_TABLET | Freq: Four times a day (QID) | ORAL | Status: DC

## 2012-04-08 MED ORDER — METHADONE HCL 10 MG PO TABS
10.0000 mg | ORAL_TABLET | Freq: Four times a day (QID) | ORAL | Status: DC
  Administered 2012-04-09: 10 mg
  Filled 2012-04-08: qty 1

## 2012-04-08 MED ORDER — CIPROFLOXACIN HCL 0.3 % OP SOLN
2.0000 [drp] | OPHTHALMIC | Status: AC
  Administered 2012-04-08 – 2012-04-16 (×43): 2 [drp] via OPHTHALMIC
  Filled 2012-04-08 (×2): qty 2.5

## 2012-04-08 MED ORDER — FREE WATER
300.0000 mL | Status: DC
  Administered 2012-04-08 – 2012-04-10 (×27): 300 mL

## 2012-04-08 MED ORDER — VANCOMYCIN HCL 1000 MG IV SOLR
INTRAVENOUS | Status: DC
  Administered 2012-04-08 – 2012-04-10 (×3): via INTRAVENOUS
  Filled 2012-04-08 (×4): qty 500

## 2012-04-08 NOTE — Progress Notes (Signed)
Pt on tc at 40$ all night.

## 2012-04-08 NOTE — Progress Notes (Signed)
PT HAS BEEN ON TC ALL DAY

## 2012-04-08 NOTE — Progress Notes (Signed)
Inpatient Diabetes Program Recommendations  AACE/ADA: New Consensus Statement on Inpatient Glycemic Control (2013)  Target Ranges:  Prepandial:   less than 140 mg/dL      Peak postprandial:   less than 180 mg/dL (1-2 hours)      Critically ill patients:  140 - 180 mg/dL   Results for Gregory [name], Gregory [name] (MRN [id number]) as of 04/08/2012 13:55  Ref. Range 04/08/2012 00:18 04/08/2012 04:18 04/08/2012 08:44 04/08/2012 12:50  Glucose-Capillary Latest Range: 70-99 mg/dL 562 (H) 130 (H) 865 (H) 186 (H)    Inpatient Diabetes Program Recommendations Insulin - Basal: Please consider starting Lantus 20 units daily or 10 units BID.  Note: Will continue to follow.  Thanks, Orlando Penner, RN, BSN, CCRN Diabetes Coordinator Inpatient Diabetes Program [phone]

## 2012-04-08 NOTE — Progress Notes (Signed)
CRITICAL VALUE ALERT  Critical value received:  Sodium 166; Chloride >130  Date of notification:  04/08/2012  Time of notification:  6:27 AM  Critical value read back:yes  Nurse who received alert:  Carlyon Prows  MD notified (1st page):  Dr Deterding  Time of first page:  6:28 AM  MD notified (2nd page):  Time of second page:  Responding MD:  Dr. Darrick Penna   Time MD responded:  6:28 AM

## 2012-04-08 NOTE — Progress Notes (Signed)
Name: Gregory [name] MRN: [id number] DOB: Jul 27, 1960    LOS: 14  Referring Provider:  EDP Reason for Referral:  Acute respiratory failure  PULMONARY / CRITICAL CARE MEDICINE  Brief patient description:  51 yo, hx obesity, presumed OHS, COPD, DM, brought to [facility] ED on 10/8 with hypotension and respiratory distress and intubated for acute respiratory failure and diffuse B infiltrates.   Events Since Admission: 10/8  Brought to [facility] ED on 10/8 in respiratory distress and intubated for B infiltrates, shock 10/9 placed on ARDS 10/9 Positive blood cultures reported 10/10 HD cath placed, CVVH started 10/13 Pressors off 10/14 htn, labetolol 10/16 CVVH off 10/17 Trach placement 10/20 ATC trials 10/22 Worse mental status, Increased Na, Fever  Current Status: guarded  Subjective:  S/p trach, tolerating trach collar. Sedation needs much less and appears over sedated with full doses.  Vital Signs: Temp:  [99.5 F (37.5 C)-102.1 F (38.9 C)] 101.2 F (38.4 C) (10/22 0100) Pulse Rate:  [90-121] 121  (10/22 0307) Resp:  [17-41] 31  (10/22 0307) BP: (91-140)/(48-84) 121/73 mmHg (10/22 0307) SpO2:  [93 %-100 %] 98 % (10/22 0307) FiO2 (%):  [40 %] 40 % (10/22 0307) Weight:  [230 lb 9.6 oz (104.6 kg)] 230 lb 9.6 oz (104.6 kg) (10/22 0500)  Intake/Output Summary (Last 24 hours) at 04/08/12 4098 Last data filed at 04/08/12 0100  Gross per 24 hour  Intake   4305 ml  Output    500 ml  Net   3805 ml   Physical Examination: General:  On ATC Neuro:  Less sedated, opens eyes, follows commands HEENT:  NGT in place. Lips dry/peeling. Edema left eye with significant conjunctivitis. Trach without edema or irritation. Cardiovascular: RRR Lungs:  Bilateral air entry, coarse throughout but improved Abdomen:  Soft, nontender,  +BS Musculoskeletal:  Atraumatic Skin:  No rash. Dry skin on feet  Active Problems:  Pneumococcal pneumonia  Acute respiratory failure  Septic shock(785.52)  Diabetes  Hyperglycemia  Acute encephalopathy  Acute renal failure  ASSESSMENT AND PLAN  PULMONARY  Lab 04/05/12 0413 04/04/12 0430 04/02/12 0457 04/01/12 1342  PHART 7.351 7.389 7.395 7.352  PCO2ART 46.7* 46.0* 46.0* 48.3*  PO2ART 118.0* 101.0* 93.0 101.0*  HCO3 24.6* 27.7* 28.3* 27.0*  O2SAT 99.0 98.0 97.0 98.0   Ventilator Settings: Vent Mode:  [-]  FiO2 (%):  [40 %] 40 %  CXR:  10/20 > Trach midline. Bilateral infiltrates, LLL continues to be more focally consolidated ETT:  10/8 >>>10/17 Tracheostomy (df): 10/17>>>  A:  Acute respiratory failure.  Pneumonia (CAP vs. Viral) +/- ARDS.  Less likely pulmonary edema.  Suspected COPD (without evidence of exacerbation) - smokes 2-3 packs of cigarettes per day. P:   -Continue ATC trials -Referral to LTAC placed yesterday; awaiting bed but patient is not medically appropriate at this time -Continue combivent scheduled -PCXR in Am   CARDIOVASCULAR No results found for this basename: TROPONINI:5,LATICACIDVEN:5, O2SATVEN:5,PROBNP:5 in the last 168 hours ECG:  10/16 NSR on monitor, rate controlled in 60's Lines: L IJ TLC 10/8 >>>10/21 Alteplase x1>> R HD Cath 10/10>>10/21 A line L radial 10/13>>d/c TTE: Mild LVH. Systolic function was normal. The estimated ejection fraction was in the range of 50% to 55%.  A: Septic Shock.  Less likely acute systolic congestive heart failure with cardiogenic shock.  NSTEMI likely secondary to shock, hypoxemia. - Troponin plateau  Afib 10/9 >> started on amio>>10/20 on PO amio P:  - Amiodarone for Afib PO per tube 200 daily; been in  NSR will d/c today. - Continue Hydralazine 25mg  TID with Labetalol 5mg  q2 prn with hold parameters. BP stable - Now off steroids  RENAL  Lab 04/08/12 0500 04/07/12 0535 04/06/12 1130 04/06/12 0452 04/05/12 0400 04/04/12 0422 04/03/12 1647  NA 166* 165* 162* 160* 151* -- --  K 3.6 3.2* -- -- -- -- --  CL >130* 128* 125* 123* 116* -- --  CO2 26 27 28 28 26  -- --  BUN 59*  64* 71* 77* 82* -- --  CREATININE 2.24* 2.22* 2.31* 2.37* 2.61* -- --  CALCIUM 9.0 9.5 9.3 9.2 9.0 -- --  MG -- 1.6 -- -- -- 2.2 --  PHOS -- 3.6 -- 5.0* 6.3* 7.5* 5.6*   Intake/Output      10/21 0701 - 10/22 0700   I.V. (mL/kg) 1185 (11.3)   Other 1000   NG/GT 1480   IV Piggyback 600   Total Intake(mL/kg) 4265 (40.8)   Net +4265        Foley:  10/8 >>>  A:  Dehydration.  ARF likely ATN improved >>>renal function improving Severe hypernatremia Hypomagnesemia>>resolved Hypokalemia Started on CVVH 10/10>>10/16  P:   -Na and Cl continue to rise despite free water q4 hours. Will minimize NS with IVPB and increase free water to every 2 hours -PM Bmet  -Con't to monitor   GASTROINTESTINAL  Lab 04/06/12 0452 04/05/12 0400 04/04/12 0422 04/03/12 1647 04/03/12 0440 04/02/12 0450  AST -- -- -- -- 30 38*  ALT -- -- -- -- 46 58*  ALKPHOS -- -- -- -- 77 88  BILITOT -- -- -- -- 0.2* 0.3  PROT -- -- -- -- 5.9* 6.0  ALBUMIN 2.3* 2.4* 2.2* 1.6* 2.3* --   A:  Suspected malnutrition. LFT elevation P:   - LFTs back to normal - TF advanced to goal 10/14, tolerating.  - Stool pathology pending   HEMATOLOGIC  Lab 04/08/12 0500 04/07/12 0535 04/06/12 1130 04/06/12 0452 04/05/12 0500 04/03/12 0434  HGB 8.5* 8.8* 9.3* 8.2* 8.4* --  HCT 29.0* 29.2* 31.1* 27.1* 26.4* --  PLT 204 194 166 169 143* --  INR -- -- -- -- -- --  APTT -- -- -- -- -- 29    A:  No active heme issues other than anemia of critical illness P:  - Transfuse only for Hgb </= 7.0   INFECTIOUS  Lab 04/08/12 0500 04/07/12 0535 04/06/12 1130 04/06/12 0452 04/05/12 0500  WBC 7.7 7.3 7.8 7.8 7.6  PROCALCITON -- -- -- -- --   Cultures: 10/8 urine>>no growth 10/8  Blood >>> GPC in pairs >> Strep pneumo 10/8  Respiratory >>> Strep pneumo 10/9 nasal influ>> neg 10/18 BCx2>>>NGTD>>> 10/18 resp c/s>>>nl flora 10/18 UC >>>neg  Antibiotics: Azithromycin 10/8 x 1 Ceftriaxone 10/8 x 1 Zosyn 10/8 >>>  10/11 Vancomycin 10/8 >>> 10/11 Levaquin 10/8 (has best MIC vs pneumococcus at this hospital, higher than ceftriaxone) >>> 10/19 Tamiflu 10/9>>10/10 10/19 zosyn (?hcap)>> 10/19 vanco (?hcap)>>  A:  Pneumonia (CAP vs aspiration),  pneumococcal PNA/sepsis, now febrile again and restarted on Abx P:   - Con't Hcap coverage, day #4 - D/c CVL, insert PICC today   ENDOCRINE  Lab 04/08/12 0418 04/08/12 0018 04/07/12 2029 04/07/12 1606 04/07/12 1212  GLUCAP 207* 169* 153* 154* 176*   A:  DM. P:   - ICU glycemic control protocol   NEUROLOGIC QTC 354  10/22   A:  Acute encephalopathy.  History of diabetic neuropathy. QTc stable. Off iv  sedation, improved mental status P:   - Decrease Risperdal to 1 q12h - Decrease Methadone to 10 q6h - Decreased per tube Ativan to 2 mg q6h - Monitor mental status; overall much improved - Head CT today  BEST PRACTICE / DISPOSITION Level of Care:  ICU  Primary Service:  PCCM Consultants:  None Code Status:  Full Diet:  Tube feeds at goal DVT Px:  SCD GI Px:  Protonix Skin Integrity:  Intact Social / Family: Daughters are points of contact, and updated daily  [employer]. [place], M.D.  04/08/2012 6:27 AM   51 yo originally admitted for Pneumococcal PNA with bacteremia with septic shock.  Course has been complicated by VDRF now s/o trach.  He has been tolerating trach collar.  Also complicated by acute renal failure>>improved.  Main issue at present in worsening mental status associated with progressive hypernatremia and inappropriately elevated urine outpt.  He also has fever ?central.  CT head shows ?of Lt hemispheric CVA.  Will continue to wean off sedatives, increased D5W and free water, f/u BMET, treat fever, and consult neurology.  I have independently reviewed chart, examined pt, and formulated assessment/plan in discussion with residents.  Critical care time 40 minutes.  Coralyn Helling, MD [facility] Pulmonary/Critical Care 04/08/2012, 2:01  PM Pager:  [phone] After 3pm call: [phone]

## 2012-04-08 NOTE — Progress Notes (Addendum)
ANTIBIOTIC CONSULT NOTE - FOLLOW UP  Pharmacy Consult for Vancomycin and Zosyn Indication: pneumonia  No Known Allergies  Patient Measurements: Height: 6\' 1"  (185.4 cm) Weight: 230 lb 9.6 oz (104.6 kg) IBW/kg (Calculated) : 79.9  Adjusted Body Weight: 89.8 kg  Vital Signs: Temp: 100.6 F (38.1 C) (10/22 1100) BP: 131/79 mmHg (10/22 1254) Pulse Rate: 103  (10/22 1111) Intake/Output from previous day: 10/21 0701 - 10/22 0700 In: 5885 [I.V.:1635; NG/GT:2600; IV Piggyback:650] Out: 1060 [Urine:860; Stool:200] Intake/Output from this shift: Total I/O In: 380 [I.V.:300; NG/GT:80] Out: 1100 [Urine:1100]  Labs:  [facility] 04/08/12 0500 04/07/12 0535 04/06/12 1130  WBC 7.7 7.3 7.8  HGB 8.5* 8.8* 9.3*  PLT 204 194 166  LABCREA -- -- --  CREATININE 2.24* 2.22* 2.31*   Estimated Creatinine Clearance: 49 ml/min (by C-G formula based on Cr of 2.24).    Microbiology: Recent Results (from the past 720 hour(s))  URINE CULTURE     Status: Normal   Collection Time   03/25/12  4:50 PM      Component Value Range Status Comment   Specimen Description URINE, CATHETERIZED   Final    Special Requests NONE   Final    Culture  Setup Time 03/25/2012 17:58   Final    Colony Count NO GROWTH   Final    Culture NO GROWTH   Final    Report Status 03/26/2012 FINAL   Final   CULTURE, BLOOD (ROUTINE X 2)     Status: Normal   Collection Time   03/25/12  4:55 PM      Component Value Range Status Comment   Specimen Description BLOOD ARM RIGHT   Final    Special Requests     Final    Value: BOTTLES DRAWN AEROBIC AND ANAEROBIC BLUE 10CC RED 5CC   Culture  Setup Time 03/26/2012 01:32   Final    Culture     Final    Value: STREPTOCOCCUS PNEUMONIAE     Note: Gram Stain Report Called to,Read Back By and Verified With: [facility] WHITE @ 1519 03/26/12 BY KRAWS   Report Status 03/28/2012 FINAL   Final    Organism ID, Bacteria STREPTOCOCCUS PNEUMONIAE   Final   CULTURE, BLOOD (ROUTINE X 2)     Status: Normal    Collection Time   03/25/12  5:15 PM      Component Value Range Status Comment   Specimen Description BLOOD HAND RIGHT   Final    Special Requests BOTTLES DRAWN AEROBIC ONLY 4CC   Final    Culture  Setup Time 03/26/2012 01:32   Final    Culture     Final    Value: STREPTOCOCCUS PNEUMONIAE     Note: SUSCEPTIBILITIES PERFORMED ON PREVIOUS CULTURE WITHIN THE LAST 5 DAYS.     Note: Gram Stain Report Called to,Read Back By and Verified With: [facility] WHITE @ 1519 03/26/12 BY KRAWS   Report Status 03/28/2012 FINAL   Final   MRSA PCR SCREENING     Status: Normal   Collection Time   03/25/12  8:58 PM      Component Value Range Status Comment   MRSA by PCR NEGATIVE  NEGATIVE Final   CULTURE, RESPIRATORY     Status: Normal   Collection Time   03/25/12  9:15 PM      Component Value Range Status Comment   Specimen Description TRACHEAL ASPIRATE   Final    Special Requests NONE   Final  Gram Stain     Final    Value: MODERATE WBC PRESENT, PREDOMINANTLY PMN     NO SQUAMOUS EPITHELIAL CELLS SEEN     FEW GRAM POSITIVE RODS     FEW GRAM POSITIVE COCCI IN PAIRS     IN CHAINS RARE GRAM NEGATIVE RODS   Culture     Final    Value: MODERATE STREPTOCOCCUS PNEUMONIAE     FEW CANDIDA ALBICANS   Report Status 03/29/2012 FINAL   Final    Organism ID, Bacteria STREPTOCOCCUS PNEUMONIAE   Final   CLOSTRIDIUM DIFFICILE BY PCR     Status: Normal   Collection Time   04/04/12 11:17 PM      Component Value Range Status Comment   C difficile by pcr NEGATIVE  NEGATIVE Final   CULTURE, BLOOD (ROUTINE X 2)     Status: Normal (Preliminary result)   Collection Time   04/05/12 10:44 AM      Component Value Range Status Comment   Specimen Description BLOOD HAND RIGHT   Final    Special Requests BOTTLES DRAWN AEROBIC AND ANAEROBIC 1CC EA   Final    Culture  Setup Time 04/06/2012 11:40   Final    Culture     Final    Value:        BLOOD CULTURE RECEIVED NO GROWTH TO DATE CULTURE WILL BE HELD FOR 5 DAYS BEFORE ISSUING  A FINAL NEGATIVE REPORT   Report Status PENDING   Incomplete   CULTURE, BLOOD (ROUTINE X 2)     Status: Normal (Preliminary result)   Collection Time   04/05/12 10:50 AM      Component Value Range Status Comment   Specimen Description BLOOD HAND LEFT   Final    Special Requests BOTTLES DRAWN AEROBIC ONLY 5CC   Final    Culture  Setup Time 04/05/2012 18:52   Final    Culture     Final    Value:        BLOOD CULTURE RECEIVED NO GROWTH TO DATE CULTURE WILL BE HELD FOR 5 DAYS BEFORE ISSUING A FINAL NEGATIVE REPORT   Report Status PENDING   Incomplete   CULTURE, RESPIRATORY     Status: Normal   Collection Time   04/05/12  2:23 PM      Component Value Range Status Comment   Specimen Description TRACHEAL ASPIRATE   Final    Special Requests NONE   Final    Gram Stain     Final    Value: NO WBC SEEN     RARE SQUAMOUS EPITHELIAL CELLS PRESENT     RARE GRAM POSITIVE COCCI     IN PAIRS   Culture Non-Pathogenic Oropharyngeal-type Flora Isolated.   Final    Report Status 04/08/2012 FINAL   Final   URINE CULTURE     Status: Normal   Collection Time   04/05/12  2:31 PM      Component Value Range Status Comment   Specimen Description URINE, RANDOM   Final    Special Requests Normal   Final    Culture  Setup Time 04/05/2012 22:00   Final    Colony Count NO GROWTH   Final    Culture NO GROWTH   Final    Report Status 04/06/2012 FINAL   Final     Anti-infectives     Start     Dose/Rate Route Frequency Ordered Stop   04/08/12 1100   dextrose 5 % 500 mL  with vancomycin (VANCOCIN) 1,500 mg infusion        250 mL/hr  Intravenous Every 24 hours 04/08/12 0828     04/05/12 1100   vancomycin (VANCOCIN) 1,500 mg in sodium chloride 0.9 % 500 mL IVPB  Status:  Discontinued        1,500 mg 250 mL/hr over 120 Minutes Intravenous Every 24 hours 04/05/12 1031 04/08/12 0820   04/05/12 1100  piperacillin-tazobactam (ZOSYN) IVPB 3.375 g       3.375 g 12.5 mL/hr over 240 Minutes Intravenous Every 8 hours  04/05/12 1031     04/04/12 2200   levofloxacin (LEVAQUIN) IVPB 500 mg        500 mg 100 mL/hr over 60 Minutes Intravenous Every 48 hours 04/03/12 0827 04/04/12 2200   04/02/12 2200   levofloxacin (LEVAQUIN) IVPB 500 mg        500 mg 100 mL/hr over 60 Minutes Intravenous Every 48 hours 04/02/12 1120 04/02/12 2311   03/28/12 2200   Levofloxacin (LEVAQUIN) IVPB 250 mg  Status:  Discontinued        250 mg 50 mL/hr over 60 Minutes Intravenous Every 24 hours 03/28/12 1339 04/02/12 1120   03/28/12 1130   cefTRIAXone (ROCEPHIN) 2 g in dextrose 5 % 50 mL IVPB  Status:  Discontinued        2 g 100 mL/hr over 30 Minutes Intravenous Every 12 hours 03/28/12 1126 03/28/12 1128   03/27/12 2200   vancomycin (VANCOCIN) 1,250 mg in sodium chloride 0.9 % 250 mL IVPB  Status:  Discontinued        1,250 mg 166.7 mL/hr over 90 Minutes Intravenous Every 24 hours 03/27/12 1452 03/28/12 1126   03/27/12 2200   Levofloxacin (LEVAQUIN) IVPB 250 mg  Status:  Discontinued        250 mg 50 mL/hr over 60 Minutes Intravenous Every 24 hours 03/27/12 1740 03/28/12 1126   03/27/12 2200   piperacillin-tazobactam (ZOSYN) IVPB 3.375 g  Status:  Discontinued        3.375 g 12.5 mL/hr over 240 Minutes Intravenous 4 times per day 03/27/12 1740 03/28/12 1126   03/27/12 2030   levofloxacin (LEVAQUIN) IVPB 750 mg  Status:  Discontinued        750 mg 100 mL/hr over 90 Minutes Intravenous Every 48 hours 03/25/12 2007 03/27/12 1740   03/26/12 1600   oseltamivir (TAMIFLU) capsule 75 mg  Status:  Discontinued        75 mg Oral Daily 03/26/12 1442 03/27/12 0824   03/26/12 1400   piperacillin-tazobactam (ZOSYN) IVPB 3.375 g  Status:  Discontinued        3.375 g 12.5 mL/hr over 240 Minutes Intravenous 3 times per day 03/26/12 0907 03/27/12 1740   03/26/12 0230   piperacillin-tazobactam (ZOSYN) IVPB 2.25 g  Status:  Discontinued        2.25 g 100 mL/hr over 30 Minutes Intravenous 4 times per day 03/25/12 2336 03/26/12 0907    03/25/12 2015  piperacillin-tazobactam (ZOSYN) IVPB 2.25 g       2.25 g 100 mL/hr over 30 Minutes Intravenous  Once 03/25/12 2005 03/25/12 2216   03/25/12 2015   vancomycin (VANCOCIN) 2,000 mg in sodium chloride 0.9 % 500 mL IVPB        2,000 mg 250 mL/hr over 120 Minutes Intravenous  Once 03/25/12 2005 03/26/12 0040   03/25/12 1945   levofloxacin (LEVAQUIN) IVPB 750 mg  Status:  Discontinued  750 mg 100 mL/hr over 90 Minutes Intravenous Every 24 hours 03/25/12 1937 03/25/12 2007   03/25/12 1645   cefTRIAXone (ROCEPHIN) 1 g in dextrose 5 % 50 mL IVPB        1 g 100 mL/hr over 30 Minutes Intravenous  Once 03/25/12 1634 03/25/12 1729   03/25/12 1645   azithromycin (ZITHROMAX) 500 mg in dextrose 5 % 250 mL IVPB        500 mg 250 mL/hr over 60 Minutes Intravenous  Once 03/25/12 1634 03/25/12 1815          Assessment: 50 YOM admitted on October 8th who had previously completed a course of antibiotics for suspected community-acquired pneumonia, who is now spiking fevers again and had his antibiotics restarted. Of note, he also is in acute renal failure and was on CVVHD during this hospitalization. Is currently febrile at 100.6 with a Tmax from the past 24 hours of 102.1. His WBC is 7.7. Vancomycin trough was drawn today with a therapeutic result of 16.3 mcg/dL. His sodium is 166 with a chloride >130.  Goal of Therapy:  Vancomycin trough level 15-20 mcg/ml  Plan:  1. Continue Zosyn at 3.375gm IV Q8hr extended interval dosing 2. Continue vancomycin dose at 1500mg  IV Q24hr, but changed fluid to dextrose due to his high sodium and chloride 3. Will continue to follow sodium and chloride levels as well as renal function 4. Will follow cultures, sensitivities, and clinical progression  Gregory [name], PharmD Clinical Pharmacist Pager: [phone] Phone: [phone] 04/08/2012 1:40 PM

## 2012-04-08 NOTE — Consult Note (Signed)
[facility] CONSULT NOTE     Reason for Consult: possible Left CVA    HPI:    Gregory [name] is an 51 y.o. male who was admitted tot he hospital on 03/25/12 for respiratory failure and shock. Since admission blood cultures and respiratory cultures grew positive for strep pneumococcus, patient suffered from STEMI likely secondary to shock,  ATN and ARF (improving), Labile BG,  hypernatremia (from 10/18 to current), hyperchloremia (10/18-current). Over last 24 hours patient has shown a acute change in mental status. Initially believed to be medication, Risperdal, Methadone and Ativan were all decreased.  There was a improvement in his mental status after these medications were decreased. However, CT of brain was obtained and showed questionable left hemispheric infarct with question of artifact.  Neurology was consulted to see patient and for advise.   Past Medical History  Diagnosis Date  . Diabetes mellitus without complication   . Hypertension     History reviewed. No pertinent past surgical history.  History reviewed. No pertinent family history.  Social History:  reports that he has been smoking.  He does not have any smokeless tobacco history on file. He reports that he does not drink alcohol or use illicit drugs.  No Known Allergies  Medications:    Prior to Admission:  Prescriptions prior to admission  Medication Sig Dispense Refill  . insulin NPH-insulin regular (NOVOLIN 70/30) (70-30) 100 UNIT/ML injection Inject 35 Units into the skin daily.      Marland Kitchen lisinopril (PRINIVIL,ZESTRIL) 5 MG tablet Take 5 mg by mouth daily.      . metFORMIN (GLUCOPHAGE) 1000 MG tablet Take 1,000 mg by mouth 2 (two) times daily with a meal.      . oxycodone (ROXICODONE) 30 MG immediate release tablet Take 30 mg by mouth every 4 (four) hours.      Marland Kitchen PARoxetine (PAXIL) 40 MG tablet Take 40 mg by mouth every morning.      . phentermine (ADIPEX-P) 37.5 MG tablet Take 37.5 mg  by mouth daily before breakfast.      . pregabalin (LYRICA) 300 MG capsule Take 300 mg by mouth 2 (two) times daily.      . traZODone (DESYREL) 150 MG tablet Take 150 mg by mouth at bedtime.       Scheduled:   . albuterol-ipratropium  6 puff Inhalation Q6H  . alteplase  2 mg Intracatheter Once  . amiodarone  200 mg Per Tube Daily  . antiseptic oral rinse  1 application Mouth Rinse QID  . chlorhexidine  15 mL Mouth/Throat BID  . ciprofloxacin  2 drop Both Eyes Q4H while awake  . dextrose 5 % 500 mL with vancomycin (VANCOCIN) 1,500 mg infusion   Intravenous Q24H  . famotidine  20 mg Per Tube BID  . feeding supplement (OXEPA)  1,000 mL Per Tube Q24H  . feeding supplement  60 mL Per Tube TID  . free water  300 mL Per Tube Q2H  . heparin subcutaneous  5,000 Units Subcutaneous Q8H  . hydrALAZINE  25 mg Per Tube Q8H  . insulin aspart  2-6 Units Subcutaneous Q4H  . LORazepam  2 mg Per Tube Q6H  . methadone  10 mg Per Tube Q6H  . piperacillin-tazobactam (ZOSYN)  IV  3.375 g Intravenous Q8H  . risperiDONE  1 mg Oral BID  . sodium chloride  1,000 mL  Intravenous Once  . sodium chloride  10-40 mL Intracatheter Q12H  . DISCONTD: ciprofloxacin  2 drop Left Eye Q4H while awake  . DISCONTD: free water  300 mL Per Tube Q4H  . DISCONTD: LORazepam  2 mg Per Tube Q6H  . DISCONTD: LORazepam  3 mg Per Tube Q6H  . DISCONTD: methadone  20 mg Per Tube Q6H  . DISCONTD: risperiDONE  3 mg Oral BID  . DISCONTD: traZODone  150 mg Oral QHS  . DISCONTD: vancomycin  1,500 mg Intravenous Q24H     Blood pressure 131/79, pulse 103, temperature 100.6 F (38.1 C), temperature source Oral, resp. rate 28, height 6\' 1"  (1.854 m), weight 104.6 kg (230 lb 9.6 oz), SpO2 96.00%.   Neurologic Examination:     No results found for this basename: cbc, bmp, coags, chol, tri, ldl, hga1c    Results for orders placed during the hospital encounter of 03/25/12 (from the past 48 hour(s))  GLUCOSE, CAPILLARY     Status:  Abnormal   Collection Time   04/06/12  3:41 PM      Component Value Range Comment   Glucose-Capillary 163 (*) 70 - 99 mg/dL   GLUCOSE, CAPILLARY     Status: Abnormal   Collection Time   04/06/12  7:58 PM      Component Value Range Comment   Glucose-Capillary 150 (*) 70 - 99 mg/dL   GLUCOSE, CAPILLARY     Status: Abnormal   Collection Time   04/07/12 12:09 AM      Component Value Range Comment   Glucose-Capillary 213 (*) 70 - 99 mg/dL    Comment 1 Notify RN     GLUCOSE, CAPILLARY     Status: Abnormal   Collection Time   04/07/12  4:02 AM      Component Value Range Comment   Glucose-Capillary 158 (*) 70 - 99 mg/dL   CBC     Status: Abnormal   Collection Time   04/07/12  5:35 AM      Component Value Range Comment   WBC 7.3  4.0 - 10.5 K/uL    RBC 2.99 (*) 4.22 - 5.81 MIL/uL    Hemoglobin 8.8 (*) 13.0 - 17.0 g/dL    HCT 45.4 (*) 09.8 - 52.0 %    MCV 97.7  78.0 - 100.0 fL    MCH 29.4  26.0 - 34.0 pg    MCHC 30.1  30.0 - 36.0 g/dL    RDW 11.9  14.7 - 82.9 %    Platelets 194  150 - 400 K/uL   BASIC METABOLIC PANEL     Status: Abnormal   Collection Time   04/07/12  5:35 AM      Component Value Range Comment   Sodium 165 (*) 135 - 145 mEq/L    Potassium 3.2 (*) 3.5 - 5.1 mEq/L    Chloride 128 (*) 96 - 112 mEq/L    CO2 27  19 - 32 mEq/L    Glucose, Bld 168 (*) 70 - 99 mg/dL    BUN 64 (*) 6 - 23 mg/dL    Creatinine, Ser 5.62 (*) 0.50 - 1.35 mg/dL    Calcium 9.5  8.4 - 13.0 mg/dL    GFR calc non Af Amer 32 (*) >90 mL/min    GFR calc Af Amer 37 (*) >90 mL/min   MAGNESIUM     Status: Normal   Collection Time   04/07/12  5:35 AM      Component  Value Range Comment   Magnesium 1.6  1.5 - 2.5 mg/dL   PHOSPHORUS     Status: Normal   Collection Time   04/07/12  5:35 AM      Component Value Range Comment   Phosphorus 3.6  2.3 - 4.6 mg/dL   GLUCOSE, CAPILLARY     Status: Abnormal   Collection Time   04/07/12  8:37 AM      Component Value Range Comment   Glucose-Capillary 160  (*) 70 - 99 mg/dL   GLUCOSE, CAPILLARY     Status: Abnormal   Collection Time   04/07/12 12:12 PM      Component Value Range Comment   Glucose-Capillary 176 (*) 70 - 99 mg/dL   GLUCOSE, CAPILLARY     Status: Abnormal   Collection Time   04/07/12  4:06 PM      Component Value Range Comment   Glucose-Capillary 154 (*) 70 - 99 mg/dL   GLUCOSE, CAPILLARY     Status: Abnormal   Collection Time   04/07/12  8:29 PM      Component Value Range Comment   Glucose-Capillary 153 (*) 70 - 99 mg/dL   GLUCOSE, CAPILLARY     Status: Abnormal   Collection Time   04/08/12 12:18 AM      Component Value Range Comment   Glucose-Capillary 169 (*) 70 - 99 mg/dL    Comment 1 Documented in Chart      Comment 2 Notify RN     GLUCOSE, CAPILLARY     Status: Abnormal   Collection Time   04/08/12  4:18 AM      Component Value Range Comment   Glucose-Capillary 207 (*) 70 - 99 mg/dL    Comment 1 Documented in Chart      Comment 2 Notify RN     BASIC METABOLIC PANEL     Status: Abnormal   Collection Time   04/08/12  5:00 AM      Component Value Range Comment   Sodium 166 (*) 135 - 145 mEq/L    Potassium 3.6  3.5 - 5.1 mEq/L    Chloride >130 (*) 96 - 112 mEq/L    CO2 26  19 - 32 mEq/L    Glucose, Bld 224 (*) 70 - 99 mg/dL    BUN 59 (*) 6 - 23 mg/dL    Creatinine, Ser 1.61 (*) 0.50 - 1.35 mg/dL    Calcium 9.0  8.4 - 09.6 mg/dL    GFR calc non Af Amer 32 (*) >90 mL/min    GFR calc Af Amer 37 (*) >90 mL/min   CBC     Status: Abnormal   Collection Time   04/08/12  5:00 AM      Component Value Range Comment   WBC 7.7  4.0 - 10.5 K/uL    RBC 2.93 (*) 4.22 - 5.81 MIL/uL    Hemoglobin 8.5 (*) 13.0 - 17.0 g/dL    HCT 04.5 (*) 40.9 - 52.0 %    MCV 99.0  78.0 - 100.0 fL    MCH 29.0  26.0 - 34.0 pg    MCHC 29.3 (*) 30.0 - 36.0 g/dL    RDW 81.1  91.4 - 78.2 %    Platelets 204  150 - 400 K/uL   GLUCOSE, CAPILLARY     Status: Abnormal   Collection Time   04/08/12  8:44 AM      Component Value Range Comment    Glucose-Capillary 181 (*)  70 - 99 mg/dL   VANCOMYCIN, TROUGH     Status: Normal   Collection Time   04/08/12 10:30 AM      Component Value Range Comment   Vancomycin Tr 16.3  10.0 - 20.0 ug/mL   GLUCOSE, CAPILLARY     Status: Abnormal   Collection Time   04/08/12 12:50 PM      Component Value Range Comment   Glucose-Capillary 186 (*) 70 - 99 mg/dL     Ct Head Wo Contrast  04/08/2012  *RADIOLOGY REPORT*  Clinical Data: Altered mental status.  CT HEAD WITHOUT CONTRAST  Technique:  Contiguous axial images were obtained from the base of the skull through the vertex without contrast.  Comparison: None.  Findings: Motion degraded exam.  No intracranial hemorrhage.  Left hemispheric infarct not entirely excluded although this appearance may be related to motion artifact.  No intracranial mass lesion detected on this unenhanced exam.  No hydrocephalus.  Small vessel disease type changes suspected.  Partial opacification mastoid air cells more notable on the right. No obvious obstructing lesion posterior-superior nasopharynx causing eustachian tube dysfunction.  Right maxillary sinus mucosal thickening.  IMPRESSION: No intracranial hemorrhage.  Left hemispheric infarct not entirely excluded although this appearance may be related to motion artifact.  This has been made a PRA call report utilizing dashboard call feature.   Original Report Authenticated By: Fuller Canada, M.D.    Dg Chest Port 1 View  04/07/2012  *RADIOLOGY REPORT*  Clinical Data: Respiratory failure  PORTABLE CHEST - 1 VIEW  Comparison: Multiple recent previous exams.  Findings: 0525 hours.  Lung volumes are stable. Cardiopericardial silhouette is at upper limits of normal for size.  Tracheostomy tube again noted.  Right IJ central line tip projects at the level of the lower right internal jugular vein.  Left IJ catheter tip projects near the innominate vein confluence. The NG tube passes into the stomach although the distal tip  position is not included on the film.  Stable vascular congestion. Bibasilar airspace disease, left greater than right, stable.  IMPRESSION: No substantial change in exam.   Original Report Authenticated By: ERIC A. MANSELL, M.D.    Echo--EF 50-55% with abnormal septal motion .  Assessment/Plan:    51 YO male with Change in mental status over past 24-48 hours in setting of hypernatremia, PNA, labile blood glucose.  During work up for change in mental status CT of head was obtained showing possible left hemispheric stroke. Exam shows no significant localizing or lateralizing deficits.  Patient is non vocal and will only follow minimal commands. Given all of patients confounding ailments stroke is a definite possibility but likely not the cause of his confusion. Would continue to evaluate with MRI brain.  Recommend: 1) MRI/MRA brain--If MRI positive for stroke continue with Carotid doppler, A1c , FLP and initiate ASA PR 2) Continue to treat hypernatremia and keep sedating and CNS medications to a minimum.   Felicie Morn [name] [facility] [phone]  04/08/2012, 2:55 PM

## 2012-04-08 NOTE — Progress Notes (Signed)
Peripherally Inserted Central Catheter/Midline Placement  The IV Nurse has discussed with the patient and/or persons authorized to consent for the patient, the purpose of this procedure and the potential benefits and risks involved with this procedure.  The benefits include less needle sticks, lab draws from the catheter and patient may be discharged home with the catheter.  Risks include, but not limited to, infection, bleeding, blood clot (thrombus formation), and puncture of an artery; nerve damage and irregular heat beat.  Alternatives to this procedure were also discussed.  PICC/Midline Placement Documentation        Gregory [name] 04/08/2012, 3:22 PM

## 2012-04-09 ENCOUNTER — Inpatient Hospital Stay (HOSPITAL_COMMUNITY): Payer: 59

## 2012-04-09 LAB — COMPREHENSIVE METABOLIC PANEL
ALT: 41 U/L (ref 0–53)
AST: 22 U/L (ref 0–37)
Albumin: 2.2 g/dL — ABNORMAL LOW (ref 3.5–5.2)
Alkaline Phosphatase: 61 U/L (ref 39–117)
CO2: 24 mEq/L (ref 19–32)
Chloride: 124 mEq/L — ABNORMAL HIGH (ref 96–112)
Creatinine, Ser: 2.04 mg/dL — ABNORMAL HIGH (ref 0.50–1.35)
GFR calc non Af Amer: 36 mL/min — ABNORMAL LOW (ref >90)
Potassium: 2.8 mEq/L — ABNORMAL LOW (ref 3.5–5.1)
Total Bilirubin: 0.2 mg/dL — ABNORMAL LOW (ref 0.3–1.2)

## 2012-04-09 LAB — PHOSPHORUS: Phosphorus: 4 mg/dL (ref 2.3–4.6)

## 2012-04-09 LAB — CBC
MCH: 29.2 pg (ref 26.0–34.0)
MCHC: 30.2 g/dL (ref 30.0–36.0)
MCV: 96.7 fL (ref 78.0–100.0)
Platelets: 182 10*3/uL (ref 150–400)
RBC: 2.71 MIL/uL — ABNORMAL LOW (ref 4.22–5.81)
RDW: 14.8 % (ref 11.5–15.5)

## 2012-04-09 LAB — MAGNESIUM: Magnesium: 1.3 mg/dL — ABNORMAL LOW (ref 1.5–2.5)

## 2012-04-09 LAB — BASIC METABOLIC PANEL
CO2: 23 mEq/L (ref 19–32)
Calcium: 8.3 mg/dL — ABNORMAL LOW (ref 8.4–10.5)
Chloride: 121 mEq/L — ABNORMAL HIGH (ref 96–112)
Glucose, Bld: 214 mg/dL — ABNORMAL HIGH (ref 70–99)
Potassium: 3.1 mEq/L — ABNORMAL LOW (ref 3.5–5.1)
Sodium: 156 mEq/L — ABNORMAL HIGH (ref 135–145)

## 2012-04-09 LAB — GLUCOSE, CAPILLARY
Glucose-Capillary: 166 mg/dL — ABNORMAL HIGH (ref 70–99)
Glucose-Capillary: 177 mg/dL — ABNORMAL HIGH (ref 70–99)

## 2012-04-09 LAB — IRON AND TIBC
Saturation Ratios: 24 % (ref 20–55)
UIBC: 155 ug/dL (ref 125–400)

## 2012-04-09 MED ORDER — METHADONE HCL 10 MG PO TABS
10.0000 mg | ORAL_TABLET | Freq: Three times a day (TID) | ORAL | Status: DC
  Administered 2012-04-09 – 2012-04-10 (×2): 10 mg
  Filled 2012-04-09 (×2): qty 1

## 2012-04-09 MED ORDER — RISPERIDONE 1 MG/ML PO SOLN
1.0000 mg | Freq: Every day | ORAL | Status: DC
  Filled 2012-04-09: qty 1

## 2012-04-09 MED ORDER — LORAZEPAM 1 MG PO TABS
2.0000 mg | ORAL_TABLET | Freq: Three times a day (TID) | ORAL | Status: DC
  Administered 2012-04-09 – 2012-04-10 (×3): 2 mg
  Administered 2012-04-10: 1 mg
  Administered 2012-04-11 – 2012-04-13 (×6): 2 mg
  Filled 2012-04-09 (×3): qty 2
  Filled 2012-04-09 (×2): qty 1
  Filled 2012-04-09 (×4): qty 2
  Filled 2012-04-09: qty 1
  Filled 2012-04-09 (×3): qty 2

## 2012-04-09 MED ORDER — POTASSIUM CHLORIDE 20 MEQ/15ML (10%) PO LIQD
60.0000 meq | Freq: Once | ORAL | Status: AC
  Administered 2012-04-09: 60 meq
  Filled 2012-04-09: qty 45

## 2012-04-09 MED ORDER — INSULIN GLARGINE 100 UNIT/ML ~~LOC~~ SOLN
20.0000 [IU] | Freq: Every day | SUBCUTANEOUS | Status: DC
  Administered 2012-04-09 – 2012-04-15 (×7): 20 [IU] via SUBCUTANEOUS

## 2012-04-09 NOTE — Progress Notes (Signed)
[facility] ADULT ICU REPLACEMENT PROTOCOL FOR AM LAB REPLACEMENT ONLY  The patient does not apply for the [facility] Adult ICU Electrolyte Replacment Protocol based on the criteria listed below:   1. Is GFR >/= 50 ml/min? no  Patient's GFR today is 41  2. Is BUN < 30 mg/dL? no  Patient's BUN today is 49 3. Abnormal electrolyte(s): K-2.8   Physician:  Dr Everlene Other 04/09/2012 6:38 AM

## 2012-04-09 NOTE — Progress Notes (Signed)
CSW attempted to assess patient today however he is unable to participate in a full assessment given his cognitive status and speech difficulties- he has given CSW permission to call his daughter, [name].  CSW to follow- Reece Levy, MSW, Theresia Majors [phone]

## 2012-04-09 NOTE — Progress Notes (Signed)
Name: Gregory [name] MRN: [id number] DOB: January 11, 1961    LOS: 15  Referring Provider:  EDP Reason for Referral:  Acute respiratory failure  PULMONARY / CRITICAL CARE MEDICINE  Brief patient description:  51 yo, hx obesity, presumed OHS, COPD, DM, brought to [facility] ED on 10/8 with hypotension and respiratory distress and intubated for acute respiratory failure and diffuse B infiltrates.   Events Since Admission: 10/8  Brought to [facility] ED on 10/8 in respiratory distress and intubated for B infiltrates, shock 10/9 placed on ARDS 10/9 Positive blood cultures reported 10/10 HD cath placed, CVVH started 10/13 Pressors off 10/14 htn, labetolol 10/16 CVVH off 10/17 Trach placement 10/20 ATC trials 10/22 Worse mental status, Increased Na, Fever 10/22 CT head ?left hemisphere infarct. Neuro consult  Current Status: guarded  Subjective:  Tolerating trach collar. Sedation needs decreased.  Vital Signs: Temp:  [99.5 F (37.5 C)-101 F (38.3 C)] 100.3 F (37.9 C) (10/23 0700) Pulse Rate:  [88-107] 88  (10/23 0318) Resp:  [17-41] 24  (10/23 0700) BP: (116-148)/(61-90) 139/76 mmHg (10/23 0700) SpO2:  [93 %-100 %] 100 % (10/23 0700) FiO2 (%):  [40 %] 40 % (10/23 0318) Weight:  [230 lb 9.6 oz (104.6 kg)] 230 lb 9.6 oz (104.6 kg) (10/23 0500)  Intake/Output Summary (Last 24 hours) at 04/09/12 0703 Last data filed at 04/09/12 0600  Gross per 24 hour  Intake   3670 ml  Output   2800 ml  Net    870 ml   Physical Examination: General:  On ATC Neuro:  Less sedated, talkative HEENT:  NGT in place. Lips dry/peeling. Edema left eye with significant conjunctivitis but improved. Trach without edema or irritation. Cardiovascular: RRR Lungs:  Bilateral air entry, coarse throughout but improved Abdomen:  Soft, nontender,  +BS Musculoskeletal:  Atraumatic Skin:  No rash. Dry skin on feet  Active Problems:  Pneumococcal pneumonia  Acute respiratory failure  Septic shock(785.52)  Diabetes  Hyperglycemia  Acute encephalopathy  Acute renal failure  Ct Head Wo Contrast  04/08/2012  *RADIOLOGY REPORT*  Clinical Data: Altered mental status.  CT HEAD WITHOUT CONTRAST  Technique:  Contiguous axial images were obtained from the base of the skull through the vertex without contrast.  Comparison: None.  Findings: Motion degraded exam.  No intracranial hemorrhage.  Left hemispheric infarct not entirely excluded although this appearance may be related to motion artifact.  No intracranial mass lesion detected on this unenhanced exam.  No hydrocephalus.  Small vessel disease type changes suspected.  Partial opacification mastoid air cells more notable on the right. No obvious obstructing lesion posterior-superior nasopharynx causing eustachian tube dysfunction.  Right maxillary sinus mucosal thickening.  IMPRESSION: No intracranial hemorrhage.  Left hemispheric infarct not entirely excluded although this appearance may be related to motion artifact.  This has been made a PRA call report utilizing dashboard call feature.   Original Report Authenticated By: Fuller Canada, M.D.    Dg Chest Port 1 View  04/09/2012  *RADIOLOGY REPORT*  Clinical Data: ARDS  PORTABLE CHEST - 1 VIEW  Comparison: 04/07/2012  Findings: Tracheostomy in satisfactory position.  PICC line has been placed on the right side with the tip at the cavoatrial junction.  NG tube in the stomach.  Bibasilar airspace disease shows slight interval improvement.  This may be atelectasis or pneumonia.  IMPRESSION: Right arm PICC tip in the cavoatrial junction.  Mild improvement in bibasilar atelectasis/infiltrate.   Original Report Authenticated By: Camelia Phenes, M.D.  ASSESSMENT AND PLAN  PULMONARY  Lab 04/05/12 0413 04/04/12 0430  PHART 7.351 7.389  PCO2ART 46.7* 46.0*  PO2ART 118.0* 101.0*  HCO3 24.6* 27.7*  O2SAT 99.0 98.0   Ventilator Settings: Vent Mode:  [-]  FiO2 (%):  [40 %] 40 %   ETT:  10/8 >>>10/17 Tracheostomy  (df): 10/17>>>  A:  Acute respiratory failure.  Pneumonia (CAP vs. Viral) +/- ARDS.  Less likely pulmonary edema.  Suspected COPD (without evidence of exacerbation) - smokes 2-3 packs of cigarettes per day. P:   -Continue TC -Continue combivent scheduled -speech to assess for speech valve   CARDIOVASCULAR  Lines: L IJ TLC 10/8 >>>10/21 Alteplase x1>>10/22 R HD Cath 10/10>>10/21 A line L radial 10/13>>d/c Right PICC 10/22>>  TTE: Mild LVH. Systolic function was normal. The estimated ejection fraction was in the range of 50% to 55%.  A: Septic Shock>>resolved.  NSTEMI likely secondary to demand ischemia Afib 10/9 >> started on amio>>10/20 on PO amio>>10/23 amio d/c P:  - Amiodarone for Afib d/c - Continue Hydralazine 25mg  TID with Labetalol 5mg  q2 prn with hold parameters - Now off stress steroids  RENAL  Lab 04/09/12 0447 04/08/12 1600 04/08/12 0500 04/07/12 0535 04/06/12 1130 04/06/12 0452 04/05/12 0400 04/04/12 0422 04/03/12 1647  NA 158* 162* 166* 165* 162* -- -- -- --  K 2.8* 3.2* -- -- -- -- -- -- --  CL 124* 127* >130* 128* 125* -- -- -- --  CO2 24 26 26 27 28  -- -- -- --  BUN 49* 55* 59* 64* 71* -- -- -- --  CREATININE 2.04* 2.16* 2.24* 2.22* 2.31* -- -- -- --  CALCIUM 8.4 9.0 9.0 9.5 9.3 -- -- -- --  MG -- -- -- 1.6 -- -- -- 2.2 --  PHOS -- -- -- 3.6 -- 5.0* 6.3* 7.5* 5.6*   Intake/Output      10/22 0701 - 10/23 0700 10/23 0701 - 10/24 0700   I.V. (mL/kg) 1830 (17.5)    Other     NG/GT 1690    IV Piggyback 150    Total Intake(mL/kg) 3670 (35.1)    Urine (mL/kg/hr) 2775 (1.1)    Stool 25    Total Output 2800    Net +870          Foley:  10/8 >>>  A:  Dehydration with severe hypernatremia.   ARF likely ATN improved >>>renal function improving Hypomagnesemia>>resolved Hypokalemia Started on CVVH 10/10>>10/16  P:   -Na and Cl improving. Continue free water q2 hours. -Hyperkalemia to 2.9; given per tube this AM -PM and AM Bmet with Mag and  Phos -Con't to monitor   GASTROINTESTINAL  Lab 04/09/12 0447 04/06/12 0452 04/05/12 0400 04/04/12 0422 04/03/12 1647 04/03/12 0440  AST 22 -- -- -- -- 30  ALT 41 -- -- -- -- 46  ALKPHOS 61 -- -- -- -- 77  BILITOT 0.2* -- -- -- -- 0.2*  PROT 6.5 -- -- -- -- 5.9*  ALBUMIN 2.2* 2.3* 2.4* 2.2* 1.6* --   A:  Suspected malnutrition. LFT elevation P:   - speech to assess swallowing - f/u LFT intermittently - continue tube feeds for now   HEMATOLOGIC  Lab 04/09/12 0447 04/08/12 0500 04/07/12 0535 04/06/12 1130 04/06/12 0452 04/03/12 0434  HGB 7.9* 8.5* 8.8* 9.3* 8.2* --  HCT 26.2* 29.0* 29.2* 31.1* 27.1* --  PLT 182 204 194 166 169 --  INR -- -- -- -- -- --  APTT -- -- -- -- --  29    A:  No active heme issues other than anemia of critical illness P:  - Trending down. Transfuse only for Hgb </= 7.0 - check anemia panel   INFECTIOUS  Lab 04/09/12 0447 04/08/12 0500 04/07/12 0535 04/06/12 1130 04/06/12 0452  WBC 6.7 7.7 7.3 7.8 7.8  PROCALCITON -- -- -- -- --   Cultures: 10/8 urine>>no growth 10/8  Blood >>> GPC in pairs >> Strep pneumo 10/8  Respiratory >>> Strep pneumo 10/9 nasal influ>> neg 10/18 BCx2>>>NGTD>>> 10/18 resp c/s>>>nl flora 10/18 UC >>>neg  Antibiotics: Azithromycin 10/8 x 1 Ceftriaxone 10/8 x 1 Zosyn 10/8 >>> 10/11 Vancomycin 10/8 >>> 10/11 Levaquin 10/8 (has best MIC vs pneumococcus at this hospital, higher than ceftriaxone) >>> 10/19 Tamiflu 10/9>>10/10 10/19 zosyn (?hcap)>> 10/19 vanco (?hcap)>>  A:  Pneumonia (CAP vs aspiration),  pneumococcal PNA/sepsis, now febrile again and restarted on Abx P:   - Con't Hcap coverage, day #5 - Febrile overnight - PICC inserted 10/22, CVL d/c - Continue cipro bilateral eyes for conjunctivitis   ENDOCRINE  Lab 04/09/12 0338 04/09/12 0009 04/08/12 1951 04/08/12 1535 04/08/12 1250  GLUCAP 173* 224* 179* 178* 186*   A:  DM. P:   - ICU glycemic control protocol - DM coordinator recommended starting  Lantus 20 units   NEUROLOGIC QTC 354  10/22   A:  Acute encephalopathy.  History of diabetic neuropathy. QTc stable. Off iv sedation, improved mental status P:   - change Risperdal to 1 qhs - change Methadone to 10 q8h - change Ativan 2 mg q8h - Head CT with ?Left hemisphere infarct; neuro following. MRI/MRA pending  BEST PRACTICE / DISPOSITION Level of Care:  ICU  Primary Service:  PCCM Consultants:  None Code Status:  Full Diet:  Tube feeds at goal DVT Px:  SCD GI Px:  Protonix Skin Integrity:  Intact Social / Family: Daughters are points of contact, and updated daily  [employer] [place], M.D.  04/09/2012 7:03 AM   Mental status improved with decrease in sedation, and improved electrolytes.  Still concern for ?CVA.  Appreciate help from neurology.  For MRI later today.  Continue to decrease sedatives as tolerated.  Fever curve better after line change>>if cx negative, then likely can d/c abx soon.    Coralyn Helling, MD [facility] Pulmonary/Critical Care 04/09/2012, 1:51 PM Pager:  [phone] After 3pm call: [phone]

## 2012-04-09 NOTE — Progress Notes (Signed)
Patient went to MRI but was unable to complete MRI scan due to restlessness and agitation .Patient stated,"Get me off this table." me

## 2012-04-09 NOTE — Progress Notes (Signed)
Physical Therapy Treatment Patient Details Name: Gregory [name] MRN: [id number] DOB: 09-16-1960 Today's Date: 04/09/2012 Time: 1191-4782 PT Time Calculation (min): 28 min  PT Assessment / Plan / Recommendation Comments on Treatment Session  Pt s/p PNA, sepsis, VDRF.  Pt fatigues quickly.  Pt with also episode of shaking/tremor on EOB without change in mental status.  Pt with incr BP and HR so pt returned to supine.    Follow Up Recommendations  Post acute inpatient     Does the patient have the potential to tolerate intense rehabilitation     Barriers to Discharge        Equipment Recommendations  None recommended by PT    Recommendations for Other Services    Frequency Min 3X/week   Plan Discharge plan remains appropriate;Frequency remains appropriate    Precautions / Restrictions Precautions Precautions: Fall Precaution Comments: multiple lines; trach collar Restrictions Weight Bearing Restrictions: No   Pertinent Vitals/Pain HR and BP incr with EOB    Mobility  Bed Mobility Bed Mobility: Supine to Sit;Sitting - Scoot to [employer] of Bed;Sit to Supine;Scooting to [facility] Supine to Sit: 1: +2 Total assist;HOB elevated Supine to Sit: Patient Percentage: 30% Sitting - Scoot to Edge of Bed: 1: +2 Total assist Sitting - Scoot to Edge of Bed: Patient Percentage: 30% Sit to Supine: 1: +2 Total assist Sit to Supine: Patient Percentage: 0% Scooting to HOB: 1: +2 Total assist Scooting to [facility]: Patient Percentage: 0% Details for Bed Mobility Assistance: Pt. required min A to maintain EOB sitting.  Pt fidgety.  Pt then with episode of uncontrolled shaking, but did no tlose consciousness.  RN alerted, HR and BP elevated and pt returned to supine (Pt. required assist for LEs and trunk with supin to sit)    Exercises     PT Diagnosis:    PT Problem List:   PT Treatment Interventions:     PT Goals Acute Rehab PT Goals PT Goal: Supine/Side to Sit - Progress: Not progressing PT Goal: Sit  to Supine/Side - Progress: Not progressing  Visit Information  Last PT Received On: 04/09/12 Assistance Needed: +2 PT/OT Co-Evaluation/Treatment: Yes    Subjective Data  Subjective: Pt verbalizing but not making sense.    Cognition  Overall Cognitive Status: Impaired Area of Impairment: Attention Arousal/Alertness: Awake/alert Orientation Level: Other (comment) (unable to asses) Behavior During Session: Other (comment) (see ADL comment) Current Attention Level: Focused Following Commands: Follows one step commands inconsistently Cognition - Other Comments: speech makes no sense (pt talking around trach).  Pt. follows commands ~50% of time with verbal and tactile cues.      Balance  Balance Balance Assessed: Yes Static Sitting Balance Static Sitting - Balance Support: Feet supported;Bilateral upper extremity supported Static Sitting - Level of Assistance: 4: Min assist;3: Mod assist Static Sitting - Comment/# of Minutes: sat initially with min A progressed to mod A with episode of shaking  End of Session PT - End of Session Equipment Utilized During Treatment: Oxygen Activity Tolerance: Patient limited by fatigue Patient left: in bed;with call bell/phone within reach;with family/visitor present Nurse Communication: Mobility status   GP     Gregory [name] 04/09/2012, 12:20 PM  [employer] PT [phone]

## 2012-04-09 NOTE — Progress Notes (Signed)
Occupational Therapy Treatment Patient Details Name: Gregory [name] MRN: [id number] DOB: [birth year] Today's Date: 04/09/2012 Time: 4696-2952 OT Time Calculation (min): 27 min  OT Assessment / Plan / Recommendation Comments on Treatment Session Pt. following commands more consistently today.  Remains very confused with nonsensical speech (pt talking around trach).  Pt. with episode of uncontrolled shaking while EOB (did not lose consciousness), and was returned to supine    Follow Up Recommendations  Inpatient Rehab;Skilled nursing facility;LTACH    Barriers to Discharge       Equipment Recommendations  None recommended by PT;None recommended by OT    Recommendations for Other Services    Frequency Min 2X/week   Plan Discharge plan remains appropriate    Precautions / Restrictions Precautions Precautions: Fall Precaution Comments: multiple lines; trach collar Restrictions Weight Bearing Restrictions: No   Pertinent Vitals/Pain     ADL  Grooming: +1 Total assistance ADL Comments: Pt. moved to EOB with total A +2 (pt. ~30%).   Pt. attempted to return to supine, but required re-direction to stay EOB.   Pt. then began to shake uncontrollably, however, did not lose consciousness. RN alerted, BP and HR elevated.  Pt. returned to supine      OT Diagnosis:    OT Problem List:   OT Treatment Interventions:     OT Goals ADL Goals ADL Goal: Grooming - Progress: Not progressing ADL Goal: Upper Body Bathing - Progress: Not progressing Pt Will Perform Lower Body Bathing: with mod assist;Sit to stand from chair ADL Goal: Lower Body Bathing - Progress: Not progressing Pt Will Transfer to Toilet: with mod assist;3-in-1 ADL Goal: Toilet Transfer - Progress: Not progressing Additional ADL Goal #1: Pt. will participate in 15 mins therapeutic activity with 3 rest breaks ADL Goal: Additional Goal #1 - Progress: Not progressing Additional ADL Goal #2: Pt. will maintain sustained attention x 3  mins during familiar ADL activity  Visit Information  Last OT Received On: 04/09/12 Assistance Needed: +2 PT/OT Co-Evaluation/Treatment: Yes    Subjective Data      Prior Functioning       Cognition  Overall Cognitive Status: Impaired Area of Impairment: Attention Arousal/Alertness: Awake/alert Orientation Level: Other (comment) (unable to asses) Behavior During Session: Other (comment) (see ADL comment) Current Attention Level: Focused Following Commands: Follows one step commands inconsistently Cognition - Other Comments: speech makes no sense (pt talking around trach).  Pt. follows commands ~50% of time with verbal and tactile cues.      Mobility  Shoulder Instructions Bed Mobility Bed Mobility: Supine to Sit;Sitting - Scoot to [employer] of Bed;Sit to Supine;Scooting to [facility] Supine to Sit: 1: +2 Total assist;HOB elevated Supine to Sit: Patient Percentage: 30% Sitting - Scoot to Edge of Bed: 1: +2 Total assist Sitting - Scoot to Edge of Bed: Patient Percentage: 30% Sit to Supine: 1: +2 Total assist Sit to Supine: Patient Percentage: 0% Scooting to HOB: 1: +2 Total assist Scooting to [facility]: Patient Percentage: 0% Details for Bed Mobility Assistance: Pt. required min A to maintain EOB sitting.  Pt fidgety.  Pt then with episode of uncontrolled shaking, but did no tlose consciousness.  RN alerted, HR and BP elevated and pt returned to supine (Pt. required assist for LEs and trunk with supin to sit)       Exercises      Balance Balance Balance Assessed: Yes Static Sitting Balance Static Sitting - Balance Support: Feet supported;Bilateral upper extremity supported Static Sitting - Level of Assistance: 4: Min  assist;3: Mod assist Static Sitting - Comment/# of Minutes: sat initially with min A progressed to mod A with episode of shaking   End of Session OT - End of Session Equipment Utilized During Treatment: Gait belt Activity Tolerance: Treatment limited secondary to medical  complications (Comment) Patient left: in bed;with call bell/phone within reach;with family/visitor present;with nursing in room Nurse Communication: Other (comment) (shaking episode)  GO     Meliya Mcconahy, Ursula Alert M 04/09/2012, 12:17 PM

## 2012-04-09 NOTE — Progress Notes (Signed)
Subjective: Patient is awake and alert.  Daughter in the room and she reports that although he is improved, he is not at baseline.  MRI remains pending.    Objective: Current vital signs: BP 151/100  Pulse 83  Temp 100.5 F (38.1 C) (Core (Comment))  Resp 21  Ht 6\' 1"  (1.854 m)  Wt 104.6 kg (230 lb 9.6 oz)  BMI 30.42 kg/m2  SpO2 100% Vital signs in last 24 hours: Temp:  [99.5 F (37.5 C)-101 F (38.3 C)] 100.5 F (38.1 C) (10/23 1000) Pulse Rate:  [83-103] 83  (10/23 0813) Resp:  [16-37] 21  (10/23 1000) BP: (82-151)/(56-100) 151/100 mmHg (10/23 1000) SpO2:  [93 %-100 %] 100 % (10/23 1000) FiO2 (%):  [40 %] 40 % (10/23 1000) Weight:  [104.6 kg (230 lb 9.6 oz)] 104.6 kg (230 lb 9.6 oz) (10/23 0500)  Intake/Output from previous day: 10/22 0701 - 10/23 0700 In: 3790 [I.V.:1930; NG/GT:1710; IV Piggyback:150] Out: 2800 [Urine:2775; Stool:25] Intake/Output this shift: Total I/O In: 1080 [I.V.:300; NG/GT:780] Out: 370 [Urine:370] Nutritional status:    Neurologic Exam: Mental Status: Alert.  Follows commands.  Speech slurred but understandable.  At times inappropriate.   Cranial Nerves: II: Discs flat bilaterally; Visual fields grossly normal, pupils equal, round, reactive to light and accommodation III,IV, VI: ptosis not present, extra-ocular motions intact bilaterally V,VII: smile symmetric, facial light touch sensation normal bilaterally VIII: hearing normal bilaterally IX,X: gag reflex present XI: bilateral shoulder shrug XII: midline tongue extension Motor: Generalized weakness noted but patient able to move all extremities against gravity.   Tone and bulk:normal tone throughout; no atrophy noted Sensory: Pinprick and light touch intact throughout, bilaterally Plantars: Right: mute   Left: mute CV: pulses palpable throughout   Lab Results: Basic Metabolic Panel:  Lab 04/09/12 1610 04/08/12 1600 04/08/12 0500 04/07/12 0535 04/06/12 1130 04/06/12 0452 04/05/12  0400 04/04/12 0422 04/03/12 1647  NA 158* 162* 166* 165* 162* -- -- -- --  K 2.8* 3.2* 3.6 3.2* 3.7 -- -- -- --  CL 124* 127* >130* 128* 125* -- -- -- --  CO2 24 26 26 27 28  -- -- -- --  GLUCOSE 180* 196* 224* 168* 144* -- -- -- --  BUN 49* 55* 59* 64* 71* -- -- -- --  CREATININE 2.04* 2.16* 2.24* 2.22* 2.31* -- -- -- --  CALCIUM 8.4 9.0 9.0 -- -- -- -- -- --  MG -- -- -- 1.6 -- -- -- 2.2 --  PHOS -- -- -- 3.6 -- 5.0* 6.3* 7.5* 5.6*    Liver Function Tests:  Lab 04/09/12 0447 04/06/12 0452 04/05/12 0400 04/04/12 0422 04/03/12 1647 04/03/12 0440  AST 22 -- -- -- -- 30  ALT 41 -- -- -- -- 46  ALKPHOS 61 -- -- -- -- 77  BILITOT 0.2* -- -- -- -- 0.2*  PROT 6.5 -- -- -- -- 5.9*  ALBUMIN 2.2* 2.3* 2.4* 2.2* 1.6* --   No results found for this basename: LIPASE:5,AMYLASE:5 in the last 168 hours No results found for this basename: AMMONIA:3 in the last 168 hours  CBC:  Lab 04/09/12 0447 04/08/12 0500 04/07/12 0535 04/06/12 1130 04/06/12 0452  WBC 6.7 7.7 7.3 7.8 7.8  NEUTROABS -- -- -- -- --  HGB 7.9* 8.5* 8.8* 9.3* 8.2*  HCT 26.2* 29.0* 29.2* 31.1* 27.1*  MCV 96.7 99.0 97.7 96.6 96.8  PLT 182 204 194 166 169    Cardiac Enzymes: No results found for this basename: CKTOTAL:5,CKMB:5,CKMBINDEX:5,TROPONINI:5 in the  last 168 hours  Lipid Panel:  Lab 04/03/12 0435  CHOL --  TRIG 899*  HDL --  CHOLHDL --  VLDL --  LDLCALC --    CBG:  Lab 04/09/12 0338 04/09/12 0009 04/08/12 1951 04/08/12 1535 04/08/12 1250  GLUCAP 173* 224* 179* 178* 186*    Microbiology: Results for orders placed during the hospital encounter of 03/25/12  URINE CULTURE     Status: Normal   Collection Time   03/25/12  4:50 PM      Component Value Range Status Comment   Specimen Description URINE, CATHETERIZED   Final    Special Requests NONE   Final    Culture  Setup Time 03/25/2012 17:58   Final    Colony Count NO GROWTH   Final    Culture NO GROWTH   Final    Report Status 03/26/2012 FINAL   Final    CULTURE, BLOOD (ROUTINE X 2)     Status: Normal   Collection Time   03/25/12  4:55 PM      Component Value Range Status Comment   Specimen Description BLOOD ARM RIGHT   Final    Special Requests     Final    Value: BOTTLES DRAWN AEROBIC AND ANAEROBIC BLUE 10CC RED 5CC   Culture  Setup Time 03/26/2012 01:32   Final    Culture     Final    Value: STREPTOCOCCUS PNEUMONIAE     Note: Gram Stain Report Called to,Read Back By and Verified With: [facility] WHITE @ 1519 03/26/12 BY KRAWS   Report Status 03/28/2012 FINAL   Final    Organism ID, Bacteria STREPTOCOCCUS PNEUMONIAE   Final   CULTURE, BLOOD (ROUTINE X 2)     Status: Normal   Collection Time   03/25/12  5:15 PM      Component Value Range Status Comment   Specimen Description BLOOD HAND RIGHT   Final    Special Requests BOTTLES DRAWN AEROBIC ONLY 4CC   Final    Culture  Setup Time 03/26/2012 01:32   Final    Culture     Final    Value: STREPTOCOCCUS PNEUMONIAE     Note: SUSCEPTIBILITIES PERFORMED ON PREVIOUS CULTURE WITHIN THE LAST 5 DAYS.     Note: Gram Stain Report Called to,Read Back By and Verified With: [facility] WHITE @ 1519 03/26/12 BY KRAWS   Report Status 03/28/2012 FINAL   Final   MRSA PCR SCREENING     Status: Normal   Collection Time   03/25/12  8:58 PM      Component Value Range Status Comment   MRSA by PCR NEGATIVE  NEGATIVE Final   CULTURE, RESPIRATORY     Status: Normal   Collection Time   03/25/12  9:15 PM      Component Value Range Status Comment   Specimen Description TRACHEAL ASPIRATE   Final    Special Requests NONE   Final    Gram Stain     Final    Value: MODERATE WBC PRESENT, PREDOMINANTLY PMN     NO SQUAMOUS EPITHELIAL CELLS SEEN     FEW GRAM POSITIVE RODS     FEW GRAM POSITIVE COCCI IN PAIRS     IN CHAINS RARE GRAM NEGATIVE RODS   Culture     Final    Value: MODERATE STREPTOCOCCUS PNEUMONIAE     FEW CANDIDA ALBICANS   Report Status 03/29/2012 FINAL   Final    Organism ID, Bacteria STREPTOCOCCUS PNEUMONIAE  Final   CLOSTRIDIUM DIFFICILE BY PCR     Status: Normal   Collection Time   04/04/12 11:17 PM      Component Value Range Status Comment   C difficile by pcr NEGATIVE  NEGATIVE Final   CULTURE, BLOOD (ROUTINE X 2)     Status: Normal (Preliminary result)   Collection Time   04/05/12 10:44 AM      Component Value Range Status Comment   Specimen Description BLOOD HAND RIGHT   Final    Special Requests BOTTLES DRAWN AEROBIC AND ANAEROBIC 1CC EA   Final    Culture  Setup Time 04/06/2012 11:40   Final    Culture     Final    Value:        BLOOD CULTURE RECEIVED NO GROWTH TO DATE CULTURE WILL BE HELD FOR 5 DAYS BEFORE ISSUING A FINAL NEGATIVE REPORT   Report Status PENDING   Incomplete   CULTURE, BLOOD (ROUTINE X 2)     Status: Normal (Preliminary result)   Collection Time   04/05/12 10:50 AM      Component Value Range Status Comment   Specimen Description BLOOD HAND LEFT   Final    Special Requests BOTTLES DRAWN AEROBIC ONLY 5CC   Final    Culture  Setup Time 04/05/2012 18:52   Final    Culture     Final    Value:        BLOOD CULTURE RECEIVED NO GROWTH TO DATE CULTURE WILL BE HELD FOR 5 DAYS BEFORE ISSUING A FINAL NEGATIVE REPORT   Report Status PENDING   Incomplete   CULTURE, RESPIRATORY     Status: Normal   Collection Time   04/05/12  2:23 PM      Component Value Range Status Comment   Specimen Description TRACHEAL ASPIRATE   Final    Special Requests NONE   Final    Gram Stain     Final    Value: NO WBC SEEN     RARE SQUAMOUS EPITHELIAL CELLS PRESENT     RARE GRAM POSITIVE COCCI     IN PAIRS   Culture Non-Pathogenic Oropharyngeal-type Flora Isolated.   Final    Report Status 04/08/2012 FINAL   Final   URINE CULTURE     Status: Normal   Collection Time   04/05/12  2:31 PM      Component Value Range Status Comment   Specimen Description URINE, RANDOM   Final    Special Requests Normal   Final    Culture  Setup Time 04/05/2012 22:00   Final    Colony Count NO GROWTH   Final     Culture NO GROWTH   Final    Report Status 04/06/2012 FINAL   Final     Coagulation Studies: No results found for this basename: LABPROT:5,INR:5 in the last 72 hours  Imaging: Ct Head Wo Contrast  04/08/2012  *RADIOLOGY REPORT*  Clinical Data: Altered mental status.  CT HEAD WITHOUT CONTRAST  Technique:  Contiguous axial images were obtained from the base of the skull through the vertex without contrast.  Comparison: None.  Findings: Motion degraded exam.  No intracranial hemorrhage.  Left hemispheric infarct not entirely excluded although this appearance may be related to motion artifact.  No intracranial mass lesion detected on this unenhanced exam.  No hydrocephalus.  Small vessel disease type changes suspected.  Partial opacification mastoid air cells more notable on the right. No obvious obstructing lesion posterior-superior nasopharynx  causing eustachian tube dysfunction.  Right maxillary sinus mucosal thickening.  IMPRESSION: No intracranial hemorrhage.  Left hemispheric infarct not entirely excluded although this appearance may be related to motion artifact.  This has been made a PRA call report utilizing dashboard call feature.   Original Report Authenticated By: Fuller Canada, M.D.    Dg Chest Port 1 View  04/09/2012  *RADIOLOGY REPORT*  Clinical Data: ARDS  PORTABLE CHEST - 1 VIEW  Comparison: 04/07/2012  Findings: Tracheostomy in satisfactory position.  PICC line has been placed on the right side with the tip at the cavoatrial junction.  NG tube in the stomach.  Bibasilar airspace disease shows slight interval improvement.  This may be atelectasis or pneumonia.  IMPRESSION: Right arm PICC tip in the cavoatrial junction.  Mild improvement in bibasilar atelectasis/infiltrate.   Original Report Authenticated By: Camelia Phenes, M.D.     Medications:  I have reviewed the patient's current medications. Scheduled:   . albuterol-ipratropium  6 puff Inhalation Q6H  . antiseptic oral  rinse  1 application Mouth Rinse QID  . chlorhexidine  15 mL Mouth/Throat BID  . ciprofloxacin  2 drop Both Eyes Q4H while awake  . dextrose 5 % 500 mL with vancomycin (VANCOCIN) 1,500 mg infusion   Intravenous Q24H  . famotidine  20 mg Per Tube BID  . feeding supplement (OXEPA)  1,000 mL Per Tube Q24H  . feeding supplement  60 mL Per Tube TID  . free water  300 mL Per Tube Q2H  . heparin subcutaneous  5,000 Units Subcutaneous Q8H  . hydrALAZINE  25 mg Per Tube Q8H  . insulin aspart  2-6 Units Subcutaneous Q4H  . insulin glargine  20 Units Subcutaneous Daily  . LORazepam  2 mg Per Tube Q6H  . methadone  10 mg Per Tube Q6H  . piperacillin-tazobactam (ZOSYN)  IV  3.375 g Intravenous Q8H  . potassium chloride  60 mEq Per Tube Once  . risperiDONE  1 mg Oral BID  . sodium chloride  1,000 mL Intravenous Once  . sodium chloride  10-40 mL Intracatheter Q12H  . DISCONTD: amiodarone  200 mg Per Tube Daily  . DISCONTD: LORazepam  2 mg Per Tube Q6H    Assessment/Plan:  Patient Active Hospital Problem List: Acute encephalopathy (03/25/2012)   Assessment: Patient improving.  CT shows questionable stroke.  No focality noted on exam.  MRI pending.     Plan: Will follow up results of MR imaging.  Case discussed with daughter.      LOS: 15 days   Thana Farr, MD [facility] [phone] 04/09/2012  10:49 AM

## 2012-04-10 DIAGNOSIS — G931 Anoxic brain damage, not elsewhere classified: Secondary | ICD-10-CM

## 2012-04-10 DIAGNOSIS — J9600 Acute respiratory failure, unspecified whether with hypoxia or hypercapnia: Secondary | ICD-10-CM

## 2012-04-10 LAB — BASIC METABOLIC PANEL
CO2: 22 mEq/L (ref 19–32)
Calcium: 8.2 mg/dL — ABNORMAL LOW (ref 8.4–10.5)
Chloride: 113 mEq/L — ABNORMAL HIGH (ref 96–112)
Creatinine, Ser: 1.76 mg/dL — ABNORMAL HIGH (ref 0.50–1.35)
GFR calc Af Amer: 50 mL/min — ABNORMAL LOW (ref >90)
GFR calc Af Amer: 51 mL/min — ABNORMAL LOW (ref >90)
Potassium: 3.1 mEq/L — ABNORMAL LOW (ref 3.5–5.1)
Sodium: 145 mEq/L (ref 135–145)

## 2012-04-10 LAB — GLUCOSE, CAPILLARY
Glucose-Capillary: 184 mg/dL — ABNORMAL HIGH (ref 70–99)
Glucose-Capillary: 185 mg/dL — ABNORMAL HIGH (ref 70–99)
Glucose-Capillary: 206 mg/dL — ABNORMAL HIGH (ref 70–99)
Glucose-Capillary: 216 mg/dL — ABNORMAL HIGH (ref 70–99)
Glucose-Capillary: 229 mg/dL — ABNORMAL HIGH (ref 70–99)

## 2012-04-10 LAB — CBC
MCV: 93.9 fL (ref 78.0–100.0)
Platelets: 178 10*3/uL (ref 150–400)
RDW: 14 % (ref 11.5–15.5)
WBC: 7 10*3/uL (ref 4.0–10.5)

## 2012-04-10 LAB — PHOSPHORUS: Phosphorus: 3.8 mg/dL (ref 2.3–4.6)

## 2012-04-10 MED ORDER — STARCH (THICKENING) PO POWD
ORAL | Status: DC | PRN
  Filled 2012-04-10: qty 227

## 2012-04-10 MED ORDER — POTASSIUM CHLORIDE 20 MEQ/15ML (10%) PO LIQD
40.0000 meq | Freq: Once | ORAL | Status: AC
  Administered 2012-04-10: 40 meq
  Filled 2012-04-10: qty 30

## 2012-04-10 MED ORDER — POTASSIUM CHLORIDE 20 MEQ/15ML (10%) PO LIQD
40.0000 meq | Freq: Two times a day (BID) | ORAL | Status: DC
  Filled 2012-04-10 (×2): qty 30

## 2012-04-10 MED ORDER — IPRATROPIUM BROMIDE 0.02 % IN SOLN
0.5000 mg | Freq: Four times a day (QID) | RESPIRATORY_TRACT | Status: DC
  Administered 2012-04-10 – 2012-04-11 (×4): 0.5 mg via RESPIRATORY_TRACT
  Filled 2012-04-10 (×4): qty 2.5

## 2012-04-10 MED ORDER — METHADONE HCL 10 MG PO TABS
10.0000 mg | ORAL_TABLET | Freq: Two times a day (BID) | ORAL | Status: AC
  Administered 2012-04-10 – 2012-04-12 (×5): 10 mg
  Filled 2012-04-10 (×5): qty 1

## 2012-04-10 MED ORDER — ALBUTEROL SULFATE (5 MG/ML) 0.5% IN NEBU
2.5000 mg | INHALATION_SOLUTION | Freq: Four times a day (QID) | RESPIRATORY_TRACT | Status: DC
  Administered 2012-04-10 – 2012-04-11 (×4): 2.5 mg via RESPIRATORY_TRACT
  Filled 2012-04-10 (×3): qty 0.5

## 2012-04-10 MED ORDER — MAGNESIUM SULFATE 40 MG/ML IJ SOLN
2.0000 g | Freq: Once | INTRAMUSCULAR | Status: AC
  Administered 2012-04-10: 2 g via INTRAVENOUS
  Filled 2012-04-10: qty 50

## 2012-04-10 MED ORDER — INSULIN ASPART 100 UNIT/ML ~~LOC~~ SOLN
0.0000 [IU] | SUBCUTANEOUS | Status: DC
  Administered 2012-04-10 (×2): 5 [IU] via SUBCUTANEOUS
  Administered 2012-04-10 – 2012-04-11 (×4): 3 [IU] via SUBCUTANEOUS

## 2012-04-10 MED ORDER — POTASSIUM CHLORIDE 20 MEQ/15ML (10%) PO LIQD
ORAL | Status: AC
  Filled 2012-04-10: qty 30

## 2012-04-10 MED ORDER — POTASSIUM CHLORIDE 20 MEQ/15ML (10%) PO LIQD
40.0000 meq | ORAL | Status: AC
  Administered 2012-04-10 (×2): 40 meq
  Filled 2012-04-10 (×2): qty 30

## 2012-04-10 NOTE — Evaluation (Signed)
Clinical/Bedside Swallow Evaluation Patient Details  Name: Gregory [name] MRN: [id number] Date of Birth: 10/11/1960  Today's Date: 04/10/2012 Time: 4098-1191 SLP Time Calculation (min): 51 min  Past Medical History:  Past Medical History  Diagnosis Date  . Diabetes mellitus without complication   . Hypertension    Past Surgical History: History reviewed. No pertinent past surgical history. HPI:  51 y.o. male admitted to the hospital on 03/25/12 for respiratory failure and shock. Since admission, blood cultures and respiratory cultures grew positive for strep pneumococcus, patient suffered from STEMI likely secondary to shock,  ATN and ARF (improving), Labile BG,  hypernatremia (from 10/18 to current), hyperchloremia (10/18-current). Patient presented with acute change in mental status. Initially believed to be medication, Risperdal, Methadone and Ativan were all decreased.  There was a improvement in his mental status after these medications were decreased. However, CT of brain was obtained and showed questionable left hemispheric infarct with question of artifact; unable to participate in MRI - pt with likely acute encephalopathy per neurology.   Intubated 10/8, trached 10/17; decannulated 10/24.  Pt pleasant, communicative, with confused language.      Assessment / Plan / Recommendation Clinical Impression  Pt presents with a mild biomechanical dysphagia due to the effects of intubation,  open stoma post-decannulation, and confusion.  Pt with overall adequate oral preparation and timely, but weakened, pharyngeal swallow.  Airway protection impaired when consuming solids in conjunction with thin liquids - symptoms of aspiration ensue; no evidence of penetration/aspiration when thin liquids are consumed in isolation.    REC:  Dysphagia 3 diet with nectar-thick liquids; may have ice chips/water when not combined with a meal.  Will likely require thickened liquids for brief 24-48 hour period with  rapid resolution of swallow function.    Aspiration Risk  Mild    Diet Recommendation Dysphagia 3 (Mechanical Soft);Nectar-thick liquid   Liquid Administration via: Cup Medication Administration: Whole meds with puree Supervision:  (assist with feeding) Compensations: Slow rate;Small sips/bites;Clear throat intermittently Postural Changes and/or Swallow Maneuvers: Seated upright 90 degrees    Other  Recommendations Oral Care Recommendations: Oral care BID Other Recommendations: Order thickener from pharmacy;Have oral suction available   Follow Up Recommendations  Inpatient Rehab    Frequency and Duration min 2x/week  2 weeks   Pertinent Vitals/Pain No pain    SLP Swallow Goals Patient will utilize recommended strategies during swallow to increase swallowing safety with: Supervision/safety   Swallow Study Prior Functional Status       General Date of Onset: 03/25/12 HPI: 51 y.o. male admitted to the hospital on 03/25/12 for respiratory failure and shock. Since admission, blood cultures and respiratory cultures grew positive for strep pneumococcus, patient suffered from STEMI likely secondary to shock,  ATN and ARF (improving), Labile BG,  hypernatremia (from 10/18 to current), hyperchloremia (10/18-current). Over last 24 hours patient has shown a acute change in mental status. Initially believed to be medication, Risperdal, Methadone and Ativan were all decreased.  There was a improvement in his mental status after these medications were decreased. However, CT of brain was obtained and showed questionable left hemispheric infarct with question of artifact; unable to participate in MRI - pt with likely acute encephalopathy.   Intubated 10/8, trached 10/17; decannulated 10/24.    Type of Study: Bedside swallow evaluation Diet Prior to this Study: NPO Temperature Spikes Noted: Yes Respiratory Status: Other (comment) (nasal cannula 2l) History of Recent Intubation: Yes Length of  Intubations (days): 9 days Date extubated:  (trached  10/17; decannulated 10/24) Behavior/Cognition: Alert;Cooperative;Confused;Impulsive Oral Cavity - Dentition: Adequate natural dentition Self-Feeding Abilities: Needs assist Patient Positioning: Upright in bed Baseline Vocal Quality: Clear Volitional Cough: Strong Volitional Swallow: Able to elicit    Oral/Motor/Sensory Function Overall Oral Motor/Sensory Function: Appears within functional limits for tasks assessed   Ice Chips Ice chips: Within functional limits   Thin Liquid Thin Liquid: Impaired (when consumed in conjunction with solids) Presentation: Cup Pharyngeal  Phase Impairments: Wet Vocal Quality;Cough - Delayed    Nectar Thick Nectar Thick Liquid: Within functional limits Presentation: Cup   Honey Thick Honey Thick Liquid: Not tested   Puree Puree: Within functional limits Presentation: Spoon   Solid   GO    Solid: Impaired Oral Phase Functional Implications: Other (comment) (prolonged oral prep)      Cynthya Yam L. Samson Frederic, MA CCC/SLP Pager [phone]  Blenda Mounts Laurice 04/10/2012,2:52 PM

## 2012-04-10 NOTE — Progress Notes (Signed)
Name: Gregory [name] MRN: [id number] DOB: January 16, 1961    LOS: 16  Referring Provider:  EDP Reason for Referral:  Acute respiratory failure  PULMONARY / CRITICAL CARE MEDICINE  Brief patient description:  51 yo, hx obesity, presumed OHS, COPD, DM, brought to [facility] ED on 10/8 with hypotension and respiratory distress and intubated for acute respiratory failure and diffuse B infiltrates.   Events Since Admission: 10/8  Brought to [facility] ED on 10/8 in respiratory distress and intubated for B infiltrates, shock 10/9 placed on ARDS 10/9 Positive blood cultures reported 10/10 HD cath placed, CVVH started 10/13 Pressors off 10/14 htn, labetolol 10/16 CVVH off 10/17 Trach placement 10/20 ATC trials 10/22 Worse mental status, Increased Na, Fever 10/22 CT head ?left hemisphere infarct. Neuro consult 10/23 Unable to obtain MRI due to restlessness  Current Status: guarded  Subjective:  Tolerating trach collar. Sedation needs decreased.  Vital Signs: Temp:  [98.6 F (37 C)-101.9 F (38.8 C)] 100.8 F (38.2 C) (10/24 0600) Pulse Rate:  [75-113] 95  (10/24 0310) Resp:  [15-31] 24  (10/24 0600) BP: (82-176)/(56-123) 142/82 mmHg (10/24 0600) SpO2:  [94 %-100 %] 99 % (10/24 0600) FiO2 (%):  [28 %-40 %] 28 % (10/24 0600)  Intake/Output Summary (Last 24 hours) at 04/10/12 0713 Last data filed at 04/10/12 0600  Gross per 24 hour  Intake 6217.5 ml  Output   5325 ml  Net  892.5 ml   Physical Examination: General:  On ATC Neuro:  Less sedated, talkative. Not oriented to place or time HEENT:  NGT in place. Lips dry/peeling. Edema left eye with improved erythema. Trach without edema or irritation. Cardiovascular: RRR Lungs:  Bilateral air entry, improved  Abdomen:  Soft, nontender,  +BS Musculoskeletal:  Atraumatic Skin:  No rash. Dry skin on feet  Active Problems:  Pneumococcal pneumonia  Acute respiratory failure  Septic shock(785.52)  Diabetes  Hyperglycemia  Acute encephalopathy  Acute renal failure  Ct Head Wo Contrast  04/08/2012  *RADIOLOGY REPORT*  Clinical Data: Altered mental status.  CT HEAD WITHOUT CONTRAST  Technique:  Contiguous axial images were obtained from the base of the skull through the vertex without contrast.  Comparison: None.  Findings: Motion degraded exam.  No intracranial hemorrhage.  Left hemispheric infarct not entirely excluded although this appearance may be related to motion artifact.  No intracranial mass lesion detected on this unenhanced exam.  No hydrocephalus.  Small vessel disease type changes suspected.  Partial opacification mastoid air cells more notable on the right. No obvious obstructing lesion posterior-superior nasopharynx causing eustachian tube dysfunction.  Right maxillary sinus mucosal thickening.  IMPRESSION: No intracranial hemorrhage.  Left hemispheric infarct not entirely excluded although this appearance may be related to motion artifact.  This has been made a PRA call report utilizing dashboard call feature.   Original Report Authenticated By: Fuller Canada, M.D.    Dg Chest Port 1 View  04/09/2012  *RADIOLOGY REPORT*  Clinical Data: ARDS  PORTABLE CHEST - 1 VIEW  Comparison: 04/07/2012  Findings: Tracheostomy in satisfactory position.  PICC line has been placed on the right side with the tip at the cavoatrial junction.  NG tube in the stomach.  Bibasilar airspace disease shows slight interval improvement.  This may be atelectasis or pneumonia.  IMPRESSION: Right arm PICC tip in the cavoatrial junction.  Mild improvement in bibasilar atelectasis/infiltrate.   Original Report Authenticated By: Camelia Phenes, M.D.     ASSESSMENT AND PLAN  PULMONARY  Lab 04/05/12 0413  04/04/12 0430  PHART 7.351 7.389  PCO2ART 46.7* 46.0*  PO2ART 118.0* 101.0*  HCO3 24.6* 27.7*  O2SAT 99.0 98.0   Ventilator Settings: Vent Mode:  [-]  FiO2 (%):  [28 %-40 %] 28 %   ETT:  10/8 >>>10/17 Tracheostomy (df): 10/17>>>10/24  A:  Acute  respiratory failure.  Pneumonia (CAP vs. Viral) +/- ARDS.  Less likely pulmonary edema.  Suspected COPD (without evidence of exacerbation) - smokes 2-3 packs of cigarettes per day. P:   -proceed with decannulation -Continue combivent scheduled   CARDIOVASCULAR  Lines: L IJ TLC 10/8 >>>10/21 Alteplase x1>>10/22 R HD Cath 10/10>>10/21 A line L radial 10/13>>d/c Right PICC 10/22>>  TTE: Mild LVH. Systolic function was normal. The estimated ejection fraction was in the range of 50% to 55%.  A: Septic Shock>>resolved.  NSTEMI likely secondary to demand ischemia Afib 10/9 >> started on amio>>10/20 on PO amio>>10/23 amio d/c. Completed course of stress steroids, now d/c P:  - Continue Hydralazine 25mg  TID with Labetalol 5mg  q2 prn with hold parameters   RENAL  Lab 04/10/12 0440 04/09/12 1446 04/09/12 0447 04/08/12 1600 04/08/12 0500 04/07/12 0535 04/06/12 0452 04/05/12 0400 04/04/12 0422  NA 152* 156* 158* 162* 166* -- -- -- --  K 2.7* 3.1* -- -- -- -- -- -- --  CL 118* 121* 124* 127* >130* -- -- -- --  CO2 21 23 24 26 26  -- -- -- --  BUN 37* 43* 49* 55* 59* -- -- -- --  CREATININE 1.76* 1.92* 2.04* 2.16* 2.24* -- -- -- --  CALCIUM 8.2* 8.3* 8.4 9.0 9.0 -- -- -- --  MG -- 1.3* -- -- -- 1.6 -- -- 2.2  PHOS -- 4.0 -- -- -- 3.6 5.0* 6.3* 7.5*   Intake/Output      10/23 0701 - 10/24 0700 10/24 0701 - 10/25 0700   I.V. (mL/kg) 2800 (26.8)    NG/GT 3330    IV Piggyback 87.5    Total Intake(mL/kg) 6217.5 (59.4)    Urine (mL/kg/hr) 4300 (1.7)    Stool 1025    Total Output 5325    Net +892.5          Foley:  10/8 >>>  A:  Dehydration with severe hypernatremia.   ARF likely ATN improved >>>renal function improving Hypomagnesemia>>resolved Hypokalemia Started on CVVH 10/10>>10/16  P:   -Na and Cl improving. Continue free water q2 hours. -Hyperkalemia to 2.7; given x2 per tube this AM. Will likely require more. -Replete Mag 2g -PM and AM Bmet with Mag and Phos -Con't  to monitor   GASTROINTESTINAL  Lab 04/09/12 0447 04/06/12 0452 04/05/12 0400 04/04/12 0422 04/03/12 1647  AST 22 -- -- -- --  ALT 41 -- -- -- --  ALKPHOS 61 -- -- -- --  BILITOT 0.2* -- -- -- --  PROT 6.5 -- -- -- --  ALBUMIN 2.2* 2.3* 2.4* 2.2* 1.6*   A:  Suspected malnutrition. LFT elevation P:   - Speech to assess swallowing - f/u LFT intermittently - continue tube feeds for now   HEMATOLOGIC  Lab 04/10/12 0440 04/09/12 0447 04/08/12 0500 04/07/12 0535 04/06/12 1130  HGB 8.0* 7.9* 8.5* 8.8* 9.3*  HCT 26.0* 26.2* 29.0* 29.2* 31.1*  PLT 178 182 204 194 166  INR -- -- -- -- --  APTT -- -- -- -- --    A:  No active heme issues other than anemia of critical illness P:  - Trending down. Transfuse only for Hgb </=  7.0 - Anemia panel does not indicate iron deficiency    INFECTIOUS  Lab 04/10/12 0440 04/09/12 0447 04/08/12 0500 04/07/12 0535 04/06/12 1130  WBC 7.0 6.7 7.7 7.3 7.8  PROCALCITON -- -- -- -- --   Cultures: 10/8 urine>>no growth 10/8  Blood >>> GPC in pairs >> Strep pneumo 10/8  Respiratory >>> Strep pneumo 10/9 nasal influ>> neg 10/18 BCx2>>>NGTD>>> 10/18 resp c/s>>>nl flora 10/18 UC >>>neg 10/18 C. Diff >>neg  Antibiotics: Azithromycin 10/8 x 1 Ceftriaxone 10/8 x 1 Zosyn 10/8 >>> 10/11 Vancomycin 10/8 >>> 10/11 Levaquin 10/8 (has best MIC vs pneumococcus at this hospital, higher than ceftriaxone) >>> 10/19 Tamiflu 10/9>>10/10 10/19 zosyn (?hcap)>> 10/19 vanco (?hcap)>>  A:  Pneumonia (CAP vs aspiration),  pneumococcal PNA/sepsis, now febrile again and restarted on Abx P:   - Con't Hcap coverage, day #6. Continue for now - Remains febrile - PICC inserted 10/22, CVL d/c - Continue cipro bilateral eyes for conjunctivitis   ENDOCRINE  Lab 04/10/12 0333 04/10/12 0028 04/09/12 1940 04/09/12 1617 04/09/12 1214  GLUCAP 201* 216* 157* 177* 185*   A:  DM. P:   - SSI with q4 cbg  - Lantus 20 units   NEUROLOGIC QTC 467  10/23   A:   Acute encephalopathy.  History of diabetic neuropathy. QTc stable. Off iv sedation, improved mental status P:   - Risperdal to 1 qhs - Methadone to 10 q8h - Ativan 2 mg q8h - Head CT with ?Left hemisphere infarct; neuro following. MRI/MRA unable to complete due to patient restlessness. Follow up neuro recs  BEST PRACTICE / DISPOSITION Level of Care:  ICU  Primary Service:  PCCM Consultants:  None Code Status:  Full Diet:  Tube feeds at goal DVT Px:  SCD GI Px:  Protonix Skin Integrity:  Intact Social / Family: Daughters are points of contact, and updated daily  [employer] [place], M.D.  04/10/2012 7:13 AM   Mental status improved with improvement in sodium and decrease in sedatives.  Will continue to correct Na, and decrease sedatives as tolerated.  Respiratory status much improved.  Did well with decannulation of tracheostomy.  Speech to assess swallowing.  May be ready for inpt rehab soon.  Coralyn Helling, MD [facility] Pulmonary/Critical Care 04/10/2012, 2:16 PM Pager:  [phone] After 3pm call: [phone]

## 2012-04-10 NOTE — Progress Notes (Signed)
Met with pt and his daughter, [name],  to discuss CIR. Pt confused and unable to take part in discussion. [name] reports that there is not anyone available to stay with patient after discharge. She understands that pt will need 24/7 supervision/assistance after d/c. We discussed CIR vs SNF.  Discussed above with pt's CM,Sara Brown. Melanee Spry will follow-up with pt in AM. Please call Thurston Hole for questions: [phone].

## 2012-04-10 NOTE — Procedures (Signed)
Tracheostomy Change Note  Patient Details:   Name: Horris Speros DOB: [birth year] MRN: [id number]    Airway Documentation:  Sutures removed by RN, pt decannulated and gauze dressing applied and taped over stoma. RT will continue to monitor site. Pt placed on 2L Coulter SAT 98%.     Evaluation  O2 sats: stable throughout Complications: No apparent complications Patient did tolerate procedure well. Bilateral Breath Sounds: Rhonchi Suctioning:  (coughed up sx)  Ok Anis, MA 04/10/2012, 10:06 AM

## 2012-04-10 NOTE — Consult Note (Signed)
Gregory [name]                                                                                         04/10/2012                                               Ophthalmology Evaluation                                          Consult Requested by: Dr. Craige Cotta Consultation requested for:  Conjunctivitis OS  HPI: Patient presented in sepsis with threatened renal failure related to Strep Pneumoniae, he was noted to have          a progressive conjunctivitis on the weekend. I was spoken with on Sunday regarding consultataion. Since therapy was already initiated that day , I asked to come in to         evaluate after treatment was underway as I would be able to assess if the treatment is proving  effective.  Pertinent Medical History:  Active Ambulatory Problems    Diagnosis Date Noted  . No Active Ambulatory Problems   Resolved Ambulatory Problems    Diagnosis Date Noted  . No Resolved Ambulatory Problems   Past Medical History  Diagnosis Date  . Diabetes mellitus without complication   . Hypertension     Pertinent Ophthalmic History:  Non-contributory  Current Eye Medications: Ciprofloxacin 1 gtt OS QID  Medications Prior to Admission  Medication Sig Dispense Refill  . insulin NPH-insulin regular (NOVOLIN 70/30) (70-30) 100 UNIT/ML injection Inject 35 Units into the skin daily.      Marland Kitchen lisinopril (PRINIVIL,ZESTRIL) 5 MG tablet Take 5 mg by mouth daily.      . metFORMIN (GLUCOPHAGE) 1000 MG tablet Take 1,000 mg by mouth 2 (two) times daily with a meal.      . oxycodone (ROXICODONE) 30 MG immediate release tablet Take 30 mg by mouth every 4 (four) hours.      Marland Kitchen PARoxetine (PAXIL) 40 MG tablet Take 40 mg by mouth every morning.      . phentermine (ADIPEX-P) 37.5 MG tablet Take 37.5 mg by mouth daily before breakfast.      . pregabalin (LYRICA) 300 MG capsule Take 300 mg by mouth 2 (two) times daily.      . traZODone (DESYREL) 150 MG tablet Take 150 mg by mouth at bedtime.         Ophthalmic ROS: red eye and discharge    Confrontation Visual Fields:   OD: full, normal    OS: full, normal    Pupils:   OD: PERRLA  OS: PERRLA  VA:          Near acuity:     [place] OD  20/30      [place]  OS 20/ 30     Intraocular Pressure:       [ x ]  Deferred                                                 [ x ]  Deferred              Lids/Lashes:     OD:  wnl    OS:  Some residual matter in lashes from prior discharge  Conjunctiva:    OD: wnl    OS: chemosis and subconjunctival hemorrhage   Sclera:    OD: normal      OS: normal    Cornea:    OD: normal   OS: normal  Anterior Chamber:    OD: deep & clear   OS: deep & clear Iris:    OD: normal     OS: normal    Lens:    OD: negative clear        OS: negative clear        Impression:   Bacterial Conjunctivitis OS  Discussion: I suspect the conjunctivitis was caused by the same organism as his pneumonia, Strep. Pneumonae. Strep causes a brisk reaction and would explain his early agressive pattern with chemosis and sub-conjunctival hemorrhage. He is responding well to the topical Ciprofloxacin.  Recommendations/Plan: Continue Ciprofloxacin Ophthalmic Solution for a course of 10 full days.    Shade Flood MD

## 2012-04-10 NOTE — Progress Notes (Signed)
CRITICAL VALUE ALERT  Critical value received:  K+ 2.7  Date of notification:  04/10/2012  Time of notification:  0530  Critical value read back:yes  Nurse who received alert:  Corky Mull  MD notified (1st page):  Dr. Darrick Penna   Time of first page:  0530  MD notified (2nd page):  Time of second page:  Responding MD:  Dr deterding   Time MD responded:  [phone]

## 2012-04-10 NOTE — Progress Notes (Signed)
[facility] ADULT ICU REPLACEMENT PROTOCOL FOR AM LAB REPLACEMENT ONLY  The patient does not apply for the [facility] Adult ICU Electrolyte Replacment Protocol based on the criteria listed below:   1. Is GFR >/= 50 ml/min? no  Patient's GFR today is 43  2. Is BUN < 30 mg/dL? no  Patient's BUN today is 37 3. Abnormal electrolyte(s):K- 2.7  4. If a panic level lab has been reported, has the CCM MD in charge been notified? yes.   Physician:  Dr Deterding by unit nurse  Llana Aliment 04/10/2012 6:38 AM

## 2012-04-10 NOTE — Consult Note (Signed)
Physical Medicine and Rehabilitation Consult Reason for Consult: Encephalopathy/deconditioning Referring Physician:  Dr. Craige Cotta.   HPI: Gregory [name] is a 51 y.o. male with history of DM, obesity,who was admitted  on 03/25/12 for respiratory failure and septic shock. Since admission blood cultures and respiratory cultures grew positive for strep pneumococcus, patient suffered from STEMI likely secondary to shock. Required CRRT briefly due to due to  ATN and ARF.  Labile BG, hypernatremia (Na 152 currently), hyperchloremia (118-current). Atrial fibrillation treated with amiodarone. He was trached on 10/17 and ATC trials initiated on 10/20. Patient was noted have worsening of MS with sedation and fever on 10/22. Sedating medications- Risperdal, Methadone and Ativan were all decreased.  However, CT of brain was obtained and showed questionable left hemispheric infarct with question of artifact. Neurology consulted and felt that patient with acute encephalopathy and MRI brain recommended for workup. Therapies initiated and patient was noted to have tremors with movement and confusion/ nonsensical speech.  Therapy team/MD recommending CIR.   Review of Systems  Unable to perform ROS: other  Constitutional: Positive for malaise/fatigue (Hurts alll over).   Past Medical History  Diagnosis Date  . Diabetes mellitus without complication   . Hypertension    History reviewed. No pertinent past surgical history.  History reviewed. No pertinent family history.  Social History:  Divorced. Self employed--works in manufacturing? He  reports that he has been smoking--1-2 PPT?  He does not have any smokeless tobacco history on file. He reports that he does not drink alcohol or use illicit drugs.  Allergies: No Known Allergies  Medications Prior to Admission  Medication Sig Dispense Refill  . insulin NPH-insulin regular (NOVOLIN 70/30) (70-30) 100 UNIT/ML injection Inject 35 Units into the skin daily.      Marland Kitchen  lisinopril (PRINIVIL,ZESTRIL) 5 MG tablet Take 5 mg by mouth daily.      . metFORMIN (GLUCOPHAGE) 1000 MG tablet Take 1,000 mg by mouth 2 (two) times daily with a meal.      . oxycodone (ROXICODONE) 30 MG immediate release tablet Take 30 mg by mouth every 4 (four) hours.      Marland Kitchen PARoxetine (PAXIL) 40 MG tablet Take 40 mg by mouth every morning.      . phentermine (ADIPEX-P) 37.5 MG tablet Take 37.5 mg by mouth daily before breakfast.      . pregabalin (LYRICA) 300 MG capsule Take 300 mg by mouth 2 (two) times daily.      . traZODone (DESYREL) 150 MG tablet Take 150 mg by mouth at bedtime.        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: Patient Percentage:  0% Transfers Transfers: Sit to Stand;Stand to Sit Sit to Stand: 1: +2 Total assist;From chair/3-in-1 Sit to Stand: Patient Percentage: 40% Stand to Sit: 1: +2 Total assist;To chair/3-in-1 Stand to Sit: Patient Percentage: 40% Ambulation/Gait Ambulation/Gait Assistance: Not tested (comment) Stairs: No Wheelchair  Mobility Wheelchair Mobility: No  ADL: ADL Eating/Feeding: NPO Where Assessed - Eating/Feeding: Chair Grooming: +1 Total assistance Where Assessed - Grooming: Supported sitting Upper Body Bathing: +1 Total assistance Where Assessed - Upper Body Bathing: Supported sitting Lower Body Bathing: +2 Total assistance Where Assessed - Lower Body Bathing: Supported sit to stand Upper Body Dressing: +1 Total assistance Where Assessed - Upper Body Dressing: Supported sitting Lower Body Dressing: Simulated;+2 Total assistance Where Assessed - Lower Body Dressing: Supported sit to Pharmacist, hospital: Simulated;+2 Total assistance Toilet Transfer Method: Sit to stand Transfers/Ambulation Related to ADLs: sit to stand with total A +2 (pt 40%) ADL Comments: Pt. moved to EOB with total A +2 (pt. ~30%).   Pt. attempted to return to supine, but required re-direction to stay EOB.   Pt. then began to shake uncontrollably, however, did not lose consciousness. RN alerted, BP and HR elevated.  Pt. returned to supine    Cognition: Cognition Arousal/Alertness: Awake/alert Orientation Level: Other (comment) (trach) Cognition Overall Cognitive Status: Impaired Area of Impairment: Attention Arousal/Alertness: Awake/alert Orientation Level: Other (comment) (unable to asses) Behavior During Session: Other (comment) (see ADL comment) Current Attention Level: Focused Following Commands: Follows one step commands inconsistently Cognition - Other Comments: speech makes no sense (pt talking around trach).  Pt. follows commands ~50% of time with verbal and tactile cues.    Blood pressure 127/71, pulse 88, temperature 100.8 F (38.2 C), temperature source Core (Comment), resp. rate 25, height 6\' 1"  (1.854 m), weight 104.6 kg (230 lb 9.6 oz), SpO2 100.00%. Physical Exam  Nursing note and vitals reviewed. Constitutional: He appears well-developed and well-nourished.       Obese male. Panda tube with bilateral  mittens in place.   HENT:  Head: Normocephalic and atraumatic.  Eyes: Pupils are equal, round, and reactive to light.  Neck:       Trach with ATC.  Cardiovascular: Normal rate and regular rhythm.   Pulmonary/Chest: Effort normal and breath sounds normal.  Abdominal: Soft. Bowel sounds are normal.  Musculoskeletal: He exhibits no edema.  Neurological: He is alert.       Oriented to self only.  Confused with childlike speech pattern. Able to follow simple one  step commands with cues. Confabulation noted. Constant chatter once activated. No tremors noted. Moves all 4's fairly equally, doesn't appear to favor one side much more than other. Speech is remarkably good considering he has a #8 trach in place. No gross CN abnormalities. No sensory deficits but difficult to keep on task when examining this.   Skin: Skin is warm and dry.    Results for orders placed during the hospital encounter of 03/25/12 (from the past 24 hour(s))  GLUCOSE, CAPILLARY     Status: Abnormal   Collection Time   04/09/12 12:14 PM      Component Value Range   Glucose-Capillary 185 (*) 70 - 99 mg/dL  BASIC METABOLIC PANEL     Status: Abnormal   Collection Time   04/09/12  2:46 PM      Component Value Range   Sodium 156 (*) 135 - 145 mEq/L   Potassium 3.1 (*) 3.5 - 5.1 mEq/L   Chloride 121 (*) 96 - 112 mEq/L  CO2 23  19 - 32 mEq/L   Glucose, Bld 214 (*) 70 - 99 mg/dL   BUN 43 (*) 6 - 23 mg/dL   Creatinine, Ser 1.61 (*) 0.50 - 1.35 mg/dL   Calcium 8.3 (*) 8.4 - 10.5 mg/dL   GFR calc non Af Amer 38 (*) >90 mL/min   GFR calc Af Amer 45 (*) >90 mL/min  MAGNESIUM     Status: Abnormal   Collection Time   04/09/12  2:46 PM      Component Value Range   Magnesium 1.3 (*) 1.5 - 2.5 mg/dL  PHOSPHORUS     Status: Normal   Collection Time   04/09/12  2:46 PM      Component Value Range   Phosphorus 4.0  2.3 - 4.6 mg/dL  IRON AND TIBC     Status: Abnormal   Collection Time   04/09/12  2:46 PM      Component Value  Range   Iron 48  42 - 135 ug/dL   TIBC 096 (*) 045 - 409 ug/dL   Saturation Ratios 24  20 - 55 %   UIBC 155  125 - 400 ug/dL  FERRITIN     Status: Normal   Collection Time   04/09/12  2:46 PM      Component Value Range   Ferritin 209  22 - 322 ng/mL  GLUCOSE, CAPILLARY     Status: Abnormal   Collection Time   04/09/12  4:17 PM      Component Value Range   Glucose-Capillary 177 (*) 70 - 99 mg/dL  GLUCOSE, CAPILLARY     Status: Abnormal   Collection Time   04/09/12  7:40 PM      Component Value Range   Glucose-Capillary 157 (*) 70 - 99 mg/dL  GLUCOSE, CAPILLARY     Status: Abnormal   Collection Time   04/10/12 12:28 AM      Component Value Range   Glucose-Capillary 216 (*) 70 - 99 mg/dL   Comment 1 Documented in Chart     Comment 2 Notify RN    GLUCOSE, CAPILLARY     Status: Abnormal   Collection Time   04/10/12  3:33 AM      Component Value Range   Glucose-Capillary 201 (*) 70 - 99 mg/dL   Comment 1 Notify RN     Comment 2 Documented in Chart    BASIC METABOLIC PANEL     Status: Abnormal   Collection Time   04/10/12  4:40 AM      Component Value Range   Sodium 152 (*) 135 - 145 mEq/L   Potassium 2.7 (*) 3.5 - 5.1 mEq/L   Chloride 118 (*) 96 - 112 mEq/L   CO2 21  19 - 32 mEq/L   Glucose, Bld 200 (*) 70 - 99 mg/dL   BUN 37 (*) 6 - 23 mg/dL   Creatinine, Ser 8.11 (*) 0.50 - 1.35 mg/dL   Calcium 8.2 (*) 8.4 - 10.5 mg/dL   GFR calc non Af Amer 43 (*) >90 mL/min   GFR calc Af Amer 50 (*) >90 mL/min  CBC     Status: Abnormal   Collection Time   04/10/12  4:40 AM      Component Value Range   WBC 7.0  4.0 - 10.5 K/uL   RBC 2.77 (*) 4.22 - 5.81 MIL/uL   Hemoglobin 8.0 (*) 13.0 - 17.0 g/dL   HCT 91.4 (*) 78.2 - 95.6 %  MCV 93.9  78.0 - 100.0 fL   MCH 28.9  26.0 - 34.0 pg   MCHC 30.8  30.0 - 36.0 g/dL   RDW 40.9  81.1 - 91.4 %   Platelets 178  150 - 400 K/uL  GLUCOSE, CAPILLARY     Status: Abnormal   Collection Time   04/10/12  8:20 AM      Component Value Range    Glucose-Capillary 184 (*) 70 - 99 mg/dL   Ct Head Wo Contrast  04/08/2012  *RADIOLOGY REPORT*  Clinical Data: Altered mental status.  CT HEAD WITHOUT CONTRAST  Technique:  Contiguous axial images were obtained from the base of the skull through the vertex without contrast.  Comparison: None.  Findings: Motion degraded exam.  No intracranial hemorrhage.  Left hemispheric infarct not entirely excluded although this appearance may be related to motion artifact.  No intracranial mass lesion detected on this unenhanced exam.  No hydrocephalus.  Small vessel disease type changes suspected.  Partial opacification mastoid air cells more notable on the right. No obvious obstructing lesion posterior-superior nasopharynx causing eustachian tube dysfunction.  Right maxillary sinus mucosal thickening.  IMPRESSION: No intracranial hemorrhage.  Left hemispheric infarct not entirely excluded although this appearance may be related to motion artifact.  This has been made a PRA call report utilizing dashboard call feature.   Original Report Authenticated By: Fuller Canada, M.D.    Dg Chest Port 1 View  04/09/2012  *RADIOLOGY REPORT*  Clinical Data: ARDS  PORTABLE CHEST - 1 VIEW  Comparison: 04/07/2012  Findings: Tracheostomy in satisfactory position.  PICC line has been placed on the right side with the tip at the cavoatrial junction.  NG tube in the stomach.  Bibasilar airspace disease shows slight interval improvement.  This may be atelectasis or pneumonia.  IMPRESSION: Right arm PICC tip in the cavoatrial junction.  Mild improvement in bibasilar atelectasis/infiltrate.   Original Report Authenticated By: Camelia Phenes, M.D.     Assessment/Plan: Diagnosis: septic shock, respiratory failure. ?anoxic/metabolic encephalopathy 1. Does the need for close, 24 hr/day medical supervision in concert with the patient's rehab needs make it unreasonable for this patient to be served in a less intensive setting?  Yes 2. Co-Morbidities requiring supervision/potential complications: dm, acute renal failure, NSTEMI 3. Due to bladder management, bowel management, safety, skin/wound care, disease management, medication administration, pain management and patient education, does the patient require 24 hr/day rehab nursing? Yes 4. Does the patient require coordinated care of a physician, rehab nurse, PT (1-2 hrs/day, 5 days/week), OT (1-2 hrs/day, 5 days/week) and SLP (1-2 hrs/day, 5 days/week) to address physical and functional deficits in the context of the above medical diagnosis(es)? Yes Addressing deficits in the following areas: balance, endurance, locomotion, strength, transferring, bowel/bladder control, bathing, dressing, feeding, grooming, toileting, cognition, speech, language, swallowing and psychosocial support 5. Can the patient actively participate in an intensive therapy program of at least 3 hrs of therapy per day at least 5 days per week? Yes and Potentially 6. The potential for patient to make measurable gains while on inpatient rehab is excellent 7. Anticipated functional outcomes upon discharge from inpatient rehab are supervision  with PT, supervision to min assist with OT, supervision to min assist with SLP. 8. Estimated rehab length of stay to reach the above functional goals is: 2-3 weeks? 9. Does the patient have adequate social supports to accommodate these discharge functional goals? Yes and Potentially 10. Anticipated D/C setting: Home 11. Anticipated post D/C treatments: Outpt  therapy 12. Overall Rehab/Functional Prognosis: excellent  RECOMMENDATIONS: This patient's condition is appropriate for continued rehabilitative care in the following setting: CIR Patient has agreed to participate in recommended program. Potentially Note that insurance prior authorization may be required for reimbursement for recommended care.  Comment: Rehab RN to follow up. He will need at least 24  supervision upon dc to home.   Ivory Broad, MD     04/10/2012

## 2012-04-10 NOTE — Progress Notes (Signed)
Nutrition Follow-up  Intervention:    Await swallow evaluation for ability to begin PO diet.  If NG tube remains in place and TF continues, change TF to better meet re-estimated needs and new nutrition goal:  Glucerna 1.2 at 25 ml/h, increase by 10 ml every 4 hours to goal rate of 65 ml/h with Prostat 30 ml BID to provide 2072 kcals, 124 gm protein, 1256 ml free water daily.  Assessment:   Patient was decannulated this morning by RT.  TF continues via NG tube.  SLP has been consulted for swallow evaluation.  May need to remove NG tube for swallow evaluation.    TF order:  Oxepa at 20 ml/h with 60 ml Prostat TID providing 1320 kcals, 120 gm protein, 377 ml free water daily.  Meds: Scheduled Meds:    . albuterol-ipratropium  6 puff Inhalation Q6H  . antiseptic oral rinse  1 application Mouth Rinse QID  . chlorhexidine  15 mL Mouth/Throat BID  . ciprofloxacin  2 drop Both Eyes Q4H while awake  . dextrose 5 % 500 mL with vancomycin (VANCOCIN) 1,500 mg infusion   Intravenous Q24H  . famotidine  20 mg Per Tube BID  . feeding supplement (OXEPA)  1,000 mL Per Tube Q24H  . feeding supplement  60 mL Per Tube TID  . free water  300 mL Per Tube Q2H  . heparin subcutaneous  5,000 Units Subcutaneous Q8H  . hydrALAZINE  25 mg Per Tube Q8H  . insulin aspart  0-15 Units Subcutaneous Q4H  . insulin glargine  20 Units Subcutaneous Daily  . LORazepam  2 mg Per Tube Q8H  . magnesium sulfate 1 - 4 g bolus IVPB  2 g Intravenous Once  . methadone  10 mg Per Tube Q12H  . piperacillin-tazobactam (ZOSYN)  IV  3.375 g Intravenous Q8H  . potassium chloride  40 mEq Per Tube Q1 Hr x 2  . sodium chloride  10-40 mL Intracatheter Q12H  . DISCONTD: insulin aspart  2-6 Units Subcutaneous Q4H  . DISCONTD: LORazepam  2 mg Per Tube Q6H  . DISCONTD: methadone  10 mg Per Tube Q6H  . DISCONTD: methadone  10 mg Per Tube Q8H  . DISCONTD: potassium chloride  40 mEq Oral BID  . DISCONTD: risperiDONE  1 mg Oral BID  .  DISCONTD: risperiDONE  1 mg Oral QHS  . DISCONTD: sodium chloride  1,000 mL Intravenous Once   Continuous Infusions:    . dextrose 100 mL (04/09/12 1056)   PRN Meds:.acetaminophen (TYLENOL) oral liquid 160 mg/5 mL, albuterol, fentaNYL, labetalol, sodium chloride, DISCONTD: midazolam, DISCONTD: traZODone  Labs:  CMP     Component Value Date/Time   NA 152* 04/10/2012 0440   K 2.7* 04/10/2012 0440   CL 118* 04/10/2012 0440   CO2 21 04/10/2012 0440   GLUCOSE 200* 04/10/2012 0440   BUN 37* 04/10/2012 0440   CREATININE 1.76* 04/10/2012 0440   CALCIUM 8.2* 04/10/2012 0440   PROT 6.5 04/09/2012 0447   ALBUMIN 2.2* 04/09/2012 0447   AST 22 04/09/2012 0447   ALT 41 04/09/2012 0447   ALKPHOS 61 04/09/2012 0447   BILITOT 0.2* 04/09/2012 0447   GFRNONAA 43* 04/10/2012 0440   GFRAA 50* 04/10/2012 0440   CBG (last 3)   Basename 04/10/12 0820 04/10/12 0333 04/10/12 0028  GLUCAP 184* 201* 216*    Sodium  Date/Time Value Range Status  04/10/2012  4:40 AM 152* 135 - 145 mEq/L Final  04/09/2012  2:46 PM 156*  135 - 145 mEq/L Final  04/09/2012  4:47 AM 158* 135 - 145 mEq/L Final    Potassium  Date/Time Value Range Status  04/10/2012  4:40 AM 2.7* 3.5 - 5.1 mEq/L Final     CRITICAL RESULT CALLED TO, READ BACK BY AND VERIFIED WITH:     S.WHITE RN [id number] E.GADDY  04/09/2012  2:46 PM 3.1* 3.5 - 5.1 mEq/L Final  04/09/2012  4:47 AM 2.8* 3.5 - 5.1 mEq/L Final    Phosphorus  Date/Time Value Range Status  04/09/2012  2:46 PM 4.0  2.3 - 4.6 mg/dL Final  82/95/6213  0:86 AM 3.6  2.3 - 4.6 mg/dL Final  57/84/6962  9:52 AM 5.0* 2.3 - 4.6 mg/dL Final    Magnesium  Date/Time Value Range Status  04/09/2012  2:46 PM 1.3* 1.5 - 2.5 mg/dL Final  84/13/2440  1:02 AM 1.6  1.5 - 2.5 mg/dL Final  72/53/6644  0:34 AM 2.2  1.5 - 2.5 mg/dL Final     Intake/Output Summary (Last 24 hours) at 04/10/12 1007 Last data filed at 04/10/12 0600  Gross per 24 hour  Intake 5137.5 ml  Output    4780 ml  Net  357.5 ml    Weight Status:  104.6 kg down from 115.3 kg on 10/18; BMI=30.4  Re-estimated needs:  2000-2200 kcals, 120-140 gm protein, 2-2.1 liters fluid daily  Nutrition Dx:  Inadequate oral intake related to inability to eat as evidenced by NPO status, ongoing.  Goal:  Enteral nutrition to provide 60-70% of estimated calorie needs (22-25 kcals/kg ideal body weight) and >/= 90% of estimated protein needs, based on ASPEN guidelines for permissive underfeeding in critically ill obese individuals, no longer applicable.  New Goal:  Intake to meet >90% of estimated nutrition needs.  Monitor:  TF tolerance, labs, weight trend, diet advancement.   Joaquin Courts, RD, LDN, CNSC Pager# [phone] After Hours Pager# [phone]

## 2012-04-11 ENCOUNTER — Inpatient Hospital Stay (HOSPITAL_COMMUNITY): Payer: 59

## 2012-04-11 LAB — BASIC METABOLIC PANEL
Chloride: 111 mEq/L (ref 96–112)
GFR calc Af Amer: 54 mL/min — ABNORMAL LOW (ref >90)
GFR calc non Af Amer: 47 mL/min — ABNORMAL LOW (ref >90)
Potassium: 3 mEq/L — ABNORMAL LOW (ref 3.5–5.1)
Sodium: 144 mEq/L (ref 135–145)

## 2012-04-11 LAB — CBC
HCT: 24.4 % — ABNORMAL LOW (ref 39.0–52.0)
Platelets: 164 10*3/uL (ref 150–400)
RDW: 13.6 % (ref 11.5–15.5)
WBC: 6.6 10*3/uL (ref 4.0–10.5)

## 2012-04-11 LAB — CULTURE, BLOOD (ROUTINE X 2): Culture: NO GROWTH

## 2012-04-11 LAB — GLUCOSE, CAPILLARY
Glucose-Capillary: 139 mg/dL — ABNORMAL HIGH (ref 70–99)
Glucose-Capillary: 167 mg/dL — ABNORMAL HIGH (ref 70–99)
Glucose-Capillary: 190 mg/dL — ABNORMAL HIGH (ref 70–99)

## 2012-04-11 MED ORDER — POTASSIUM CHLORIDE CRYS ER 20 MEQ PO TBCR
40.0000 meq | EXTENDED_RELEASE_TABLET | Freq: Once | ORAL | Status: AC
  Administered 2012-04-11: 40 meq via ORAL
  Filled 2012-04-11: qty 2

## 2012-04-11 MED ORDER — FAMOTIDINE 20 MG PO TABS
20.0000 mg | ORAL_TABLET | Freq: Two times a day (BID) | ORAL | Status: DC
  Administered 2012-04-11 – 2012-04-17 (×13): 20 mg via ORAL
  Filled 2012-04-11 (×14): qty 1

## 2012-04-11 MED ORDER — INSULIN ASPART 100 UNIT/ML ~~LOC~~ SOLN
0.0000 [IU] | Freq: Three times a day (TID) | SUBCUTANEOUS | Status: DC
  Administered 2012-04-11 (×2): 3 [IU] via SUBCUTANEOUS
  Administered 2012-04-12 (×3): 2 [IU] via SUBCUTANEOUS
  Administered 2012-04-14: 3 [IU] via SUBCUTANEOUS
  Administered 2012-04-14: 2 [IU] via SUBCUTANEOUS
  Administered 2012-04-14: 3 [IU] via SUBCUTANEOUS
  Administered 2012-04-15 (×2): 2 [IU] via SUBCUTANEOUS
  Administered 2012-04-15 – 2012-04-16 (×2): 3 [IU] via SUBCUTANEOUS
  Administered 2012-04-16 – 2012-04-17 (×3): 2 [IU] via SUBCUTANEOUS
  Administered 2012-04-17: 3 [IU] via SUBCUTANEOUS

## 2012-04-11 MED ORDER — VANCOMYCIN HCL IN DEXTROSE 1-5 GM/200ML-% IV SOLN
1000.0000 mg | Freq: Two times a day (BID) | INTRAVENOUS | Status: DC
  Administered 2012-04-11 – 2012-04-13 (×5): 1000 mg via INTRAVENOUS
  Filled 2012-04-11 (×7): qty 200

## 2012-04-11 MED ORDER — INSULIN ASPART 100 UNIT/ML ~~LOC~~ SOLN: 0.0000 [IU] | Freq: Every day | SUBCUTANEOUS | Status: DC

## 2012-04-11 NOTE — Progress Notes (Signed)
[facility] ADULT ICU REPLACEMENT PROTOCOL FOR AM LAB REPLACEMENT ONLY  The patient does not apply for the [facility] Adult ICU Electrolyte Replacment Protocol based on the criteria listed below:   1. Is GFR >/= 50 ml/min? no  Patient's GFR today is 47  6. If a panic level lab has been reported, has the CCM MD in charge been notified? yes.   Physician:  Dr Alyssa Grove, Lelon Perla 04/11/2012 5:30 AM

## 2012-04-11 NOTE — Progress Notes (Signed)
Brief Nutrition Note  Patient passed MBS today; SLP recommended Regular diet with thin liquids after MBS today.  Current diet order is Dysphagia 3 with nectar thick liquids.  RN reports patient consumed 50% of lunch today.  Appetite is good.  Expect PO intake will be adequate once diet is advanced to regular diet.  Awaiting MD order to advance diet.  RD to continue to follow for adequacy of oral intake; no supplements at this time.     Joaquin Courts, RD, LDN, CNSC Pager# [phone] After Hours Pager# [phone]

## 2012-04-11 NOTE — Progress Notes (Signed)
Rehab admissions will f/up w/ pt next week.  Need to see more participation in therapies by pt.  Spoke w/ pt's CM, Avie Arenas about him.  Note that he might d/c SNF.   [phone]

## 2012-04-11 NOTE — Progress Notes (Signed)
  [place] fax[place] out to [place], [place], [place] and [place].  Hope to hear back today or over weekend. PASRR # received which is [id number]. Hand off request for weekend to provide bed offers to patient and family.  Carney Bern 04/11/2012 2:03 PM

## 2012-04-11 NOTE — Progress Notes (Signed)
VASCULAR LAB PRELIMINARY  PRELIMINARY  PRELIMINARY  PRELIMINARY  Carotid duplex completed.    Preliminary report:  Bilateral:  No evidence of hemodynamically significant internal carotid artery stenosis.   Vertebral artery flow is antegrade.     Enora Trillo, RVS 04/11/2012, 4:58 PM

## 2012-04-11 NOTE — Progress Notes (Signed)
Name: Gregory [name] MRN: [id number] DOB: [birth year]    LOS: 17  Referring Provider:  EDP Reason for Referral:  Acute respiratory failure  PULMONARY / CRITICAL CARE MEDICINE  Brief patient description:  50 yo, hx obesity, presumed OHS, COPD, DM, brought to [facility] ED on 10/8 with hypotension and respiratory distress and intubated for acute respiratory failure and diffuse B infiltrates.   Events Since Admission: 10/8  Brought to [facility] ED on 10/8 in respiratory distress and intubated for B infiltrates, shock 10/9 placed on ARDS 10/9 Positive blood cultures reported 10/10 HD cath placed, CVVH started 10/13 Pressors off 10/14 htn, labetolol 10/16 CVVH off 10/17 Trach placement 10/20 ATC trials 10/22 Worse mental status, Increased Na, Fever 10/22 CT head ?left hemisphere infarct. Neuro consult 10/23 Unable to obtain MRI due to restlessness 10/24 Decannulated  Current Status: Stable  Subjective:  Decannulated and doing well. Eating ice without signs of aspiration  Vital Signs: Temp:  [97.1 F (36.2 C)-101.1 F (38.4 C)] 99.6 F (37.6 C) (10/25 0430) Pulse Rate:  [81-88] 85  (10/24 1618) Resp:  [14-29] 18  (10/25 0700) BP: (120-151)/(70-105) 120/75 mmHg (10/25 0700) SpO2:  [96 %-100 %] 100 % (10/25 0700) FiO2 (%):  [28 %] 28 % (10/24 0900) Weight:  [235 lb 3.7 oz (106.7 kg)] 235 lb 3.7 oz (106.7 kg) (10/25 0207)  Intake/Output Summary (Last 24 hours) at 04/11/12 0724 Last data filed at 04/11/12 0700  Gross per 24 hour  Intake   4690 ml  Output   3950 ml  Net    740 ml   Physical Examination: General:  Awake in bed, NAD. Watching TV Neuro:  Notsedated, talkative. Oriented to person only; not place, time or situation HEENT: Edema left eye with improved erythema. Dressing over trach site without drainage Cardiovascular: RRR Lungs:  Bilateral air entry, improved  Abdomen:  Soft, nontender,  +BS Musculoskeletal:  Atraumatic. Moves all extremities Skin:  No rash. Dry skin on  feet  Active Problems:  Pneumococcal pneumonia  Acute respiratory failure  Septic shock(785.52)  Diabetes  Hyperglycemia  Acute encephalopathy  Acute renal failure  No results found.  ASSESSMENT AND PLAN  PULMONARY  Lab 04/05/12 0413  PHART 7.351  PCO2ART 46.7*  PO2ART 118.0*  HCO3 24.6*  O2SAT 99.0   Ventilator Settings: Vent Mode:  [-]  FiO2 (%):  [28 %] 28 %   ETT:  10/8 >>>10/17 Tracheostomy (df): 10/17>>>10/24  A:  Acute respiratory failure.  Pneumonia (CAP vs. Viral) +/- ARDS.  Less likely pulmonary edema.  Suspected COPD (without evidence of exacerbation) - smokes 2-3 packs of cigarettes per day. P:   -Doing well decannulated  -Change nebs to prn   CARDIOVASCULAR  Lines: L IJ TLC 10/8 >>>10/21 Alteplase x1>>10/22 R HD Cath 10/10>>10/21 A line L radial 10/13>>d/c Right PICC 10/22>>  TTE: Mild LVH. Systolic function was normal. The estimated ejection fraction was in the range of 50% to 55%.  A: Septic Shock>>resolved.  NSTEMI likely secondary to demand ischemia Afib 10/9 >> started on amio>>10/20 on PO amio>>10/23 amio d/c. Completed course of stress steroids, now d/c P:  - Continue Hydralazine 25mg  TID with Labetalol 5mg  q2 prn with hold parameters   RENAL  Lab 04/11/12 0320 04/10/12 2100 04/10/12 0440 04/09/12 1446 04/09/12 0447 04/07/12 0535 04/06/12 0452 04/05/12 0400  NA 144 145 152* 156* 158* -- -- --  K 3.0* 3.1* -- -- -- -- -- --  CL 111 113* 118* 121* 124* -- -- --  CO2 22 22 21 23 24  -- -- --  BUN 26* 29* 37* 43* 49* -- -- --  CREATININE 1.64* 1.71* 1.76* 1.92* 2.04* -- -- --  CALCIUM 8.3* 8.3* 8.2* 8.3* 8.4 -- -- --  MG -- 1.6 -- 1.3* -- 1.6 -- --  PHOS -- 3.8 -- 4.0 -- 3.6 5.0* 6.3*   Intake/Output      10/24 0701 - 10/25 0700 10/25 0701 - 10/26 0700   P.O. 720    I.V. (mL/kg) 2480 (23.2)    NG/GT 1340    IV Piggyback 150    Total Intake(mL/kg) 4690 (44)    Urine (mL/kg/hr) 3225 (1.3)    Stool 725    Total Output 3950     Net +740          Foley:  10/8 >>>  A:  Dehydration with severe hypernatremia.   ARF likely ATN improved >>>renal function improving Hypomagnesemia>>resolved Hypokalemia Started on CVVH 10/10>>10/16  P:   -Na and Cl improving. D/c free water now that taking diet, continue D5W at 50cc/hr -Hypokalemia to 3.0; given PO this AM. Anticipate improvement with diet -AM Bmet with Mag and Phos -Con't to monitor   GASTROINTESTINAL  Lab 04/09/12 0447 04/06/12 0452 04/05/12 0400  AST 22 -- --  ALT 41 -- --  ALKPHOS 61 -- --  BILITOT 0.2* -- --  PROT 6.5 -- --  ALBUMIN 2.2* 2.3* 2.4*   A:  Suspected malnutrition. LFT elevation P:   - Started on Dys3 diet with thick liquids for aspiration risk - f/u LFT tomorrow   HEMATOLOGIC  Lab 04/11/12 0320 04/10/12 0440 04/09/12 0447 04/08/12 0500 04/07/12 0535  HGB 7.7* 8.0* 7.9* 8.5* 8.8*  HCT 24.4* 26.0* 26.2* 29.0* 29.2*  PLT 164 178 182 204 194  INR -- -- -- -- --  APTT -- -- -- -- --    A:  No active heme issues other than anemia of critical illness P:  - Trending down. Transfuse only for Hgb </= 7.0. No signs of bleeding - Anemia panel does not indicate iron deficiency  - Continue to monitor   INFECTIOUS  Lab 04/11/12 0320 04/10/12 0440 04/09/12 0447 04/08/12 0500 04/07/12 0535  WBC 6.6 7.0 6.7 7.7 7.3  PROCALCITON -- -- -- -- --   Cultures: 10/8 urine>>no growth 10/8  Blood >>> GPC in pairs >> Strep pneumo 10/8  Respiratory >>> Strep pneumo 10/9 nasal influ>> neg 10/18 BCx2>>>NGTD>>> 10/18 resp c/s>>>nl flora 10/18 UC >>>neg 10/18 C. Diff >>neg  Antibiotics: Azithromycin 10/8 x 1 Ceftriaxone 10/8 x 1 Zosyn 10/8 >>> 10/11 Vancomycin 10/8 >>> 10/11 Levaquin 10/8 (has best MIC vs pneumococcus at this hospital, higher than ceftriaxone) >>> 10/19 Tamiflu 10/9>>10/10 10/19 zosyn (?hcap)>> 10/19 vanco (?hcap)>>  A:  Pneumonia (CAP vs aspiration),  pneumococcal PNA/sepsis, now febrile again and restarted on  Abx P:   - Con't Hcap coverage, day #6. Continue for 10 days - Remains febrile (Tmax 100.7) but fever curve overall trending down - PICC inserted 10/22, CVL d/c - Continue cipro bilateral eyes for conjunctivitis for 10 days per Opthalmology    ENDOCRINE  Lab 04/11/12 0428 04/11/12 0038 04/10/12 1942 04/10/12 1549 04/10/12 1340  GLUCAP 167* 190* 206* 185* 229*   A:  DM. P:   - SSI qac/hs - Lantus 20 units   NEUROLOGIC QTC 467  10/23   A:  Acute encephalopathy.  History of diabetic neuropathy. QTc stable. Off iv sedation, improved mental status P:   -  Methadone to 10 q12h, d/c 10/26 - Ativan 2 mg q8h - Head CT with ?Left hemisphere infarct; neuro following. MRI/MRA unable to complete due to patient restlessness.  - Await neuro recs for further work up   [employer] PRACTICE / DISPOSITION Level of Care:  ICU >> Telemetry today Primary Service:  PCCM Consultants:  None Code Status:  Full Diet:  Tube feeds at goal DVT Px:  SCD GI Px:  Protonix Skin Integrity:  Intact Social / Family: Daughters are points of contact, and updated at beside 10/25  [employer]. [place], M.D.  04/11/2012 7:24 AM   Reviewed above, examined pt, and agree with assessment/plan.  He has made excellent progress over past few days.  Will plan to d/c methadone on 10/26.  Will need to taper off ativan as tolerated over next several day.  He is ready for rehab.  If bed not available soon, will then transfer to telemetry.  Coralyn Helling, MD [facility] Pulmonary/Critical Care 04/11/2012, 3:18 PM Pager:  [phone] After 3pm call: [phone]

## 2012-04-11 NOTE — Discharge Summary (Signed)
Physician Discharge Summary     Patient ID: Gregory [name] MRN: [id number] DOB/AGE: [birth year] 51 y.o.  Admit date: 03/25/2012 Discharge date: 04/11/2012  Admission Diagnoses: Acute respiratory failure  Discharge Diagnoses:  Active Problems:  Pneumococcal pneumonia  Acute respiratory failure  Septic shock(785.52)  Diabetes  Hyperglycemia  Acute encephalopathy  Acute renal failure   Significant Hospital tests/ studies/ interventions and procedures 10/8 Brought to [facility] ED on 10/8 in respiratory distress and intubated for B infiltrates, shock  10/9 placed on ARDS  10/9 Positive blood cultures reported  10/10 HD cath placed, CVVH started  10/13 Pressors off  10/14 htn, labetolol  10/16 CVVH off  10/17 Trach placement  10/20 ATC trials  10/22 Worse mental status, Increased Na, Fever  10/22 CT head ?left hemisphere infarct. Neuro consult  10/23 Unable to obtain MRI due to restlessness  10/24 Decannulated    Hospital Course:  51 yo white male with past medical hx of obesity, presumed OHS, COPD (3ppd smoker), uncontrolled DM, uncontrolled HTN, alcohol use, opioid use who was brought to [facility] ED on 10/8 with hypotension and respiratory distress. He was then intubated for acute respiratory failure and diffuse bilateral infiltrates.   Acute respiratory failure. Pneumonia mostly likely CAP +/- ARDS. Less likely pulmonary edema. Suspected COPD (without evidence of exacerbation) - smokes 2-3 packs of cigarettes per day. Patient was started on antibiotics with little initial improvement. (see all culture and antibiotic results below) He was also on sepsis protocol, as well as ARDS protocol. Combivent was continued scheduled. Patient's fevers resolved and he clinically improved greatly. He completed course of Vanc/Zosyn. Day after completing antibiotics, fevers returned. He was restarted on antibiotics for additional 10 days. Patient was unable to wean from vent, therefore tracheostomy was placed on  10/17. He did well with that procedure and was able to quickly wean. Therefore, he was decannulated on 10/24 and has been able to fully protect his airway on room air. At time of discharge, patient has improved  Cultures:  10/8 urine>>no growth  10/8 Blood >>> GPC in pairs >> Strep pneumo  10/8 Respiratory >>> Strep pneumo  10/9 nasal influ>> neg  10/18 BCx2>>>NGTD>>>  10/18 resp c/s>>>nl flora  10/18 UC >>>neg  10/18 C. Diff >>neg   Antibiotics:  Azithromycin 10/8 x 1  Ceftriaxone 10/8 x 1  Zosyn 10/8 >>> 10/11  Vancomycin 10/8 >>> 10/11  Levaquin 10/8 (has best MIC vs pneumococcus at this hospital, higher than ceftriaxone) >>> 10/19  Tamiflu 10/9>>10/10  10/19 zosyn (?hcap)>>  10/19 vanco (?hcap)>>   Shock. Required pressors on admission, that were weaned off on day #5. He required volume, as well as course of stress dose steroids.  Sepsis. Patient's blood grew Strep pneumo, in addition to Strep pneumo pneumonia. His echo was normal. He also had repeat blood cultures that had no growth after antibiotics.   ARF. Patient developed ARF secondary to shock. He was started on CRRT on 10/10 via right IJ HD cath, which he tolerated well. Creat improved and continuous dialysis was stopped on 10/16. Patient did not require intermittent dialysis, and his creat has remained stable.   Acute encephalopathy. Patient had encephalopathy with difficulty sedating. He required very high doses of sedation while he was on the vent as well as when he was weaning off. This could be secondary to his prior alcohol and opioid use. Neurology was consulted. Head CT did show a possible infarct, but MRI could not be completed. As he was weaning, patient was  transitioned to PO Methadone, Risperdal, and Ativan. By time of discharge, he was off all sedating medications except Ativan which was continued for alcohol use.   Shock liver. AST/ALT greatly elevated. Also has history of alcohol use. LFTs were trending and did  normalize. No further issues  A.fib with RVR. Patient developed A.fib with RVR. He was started on Diltiazem drip which controlled his rate well. He continued on the drip for multiple days, and was transitioned to PO once stabilized. Patient stayed on PO for one week, and it was d/c with no further issues.  HTN. Once pressors were weaned, patient became hypertensive. He has a history of uncontrolled HTN that was controlled with Hydralazine TID with Labetalol prn.  Hypernatremia. Unsure of etiology, perhaps polypharmacy? No intracranial reasons identified. Corrected slowly with free water, which was stopped prior to discharge  DM. Known history of uncontrolled DM. CBG controlled with SSI and Lantus 20.  Anemia. HgB trended down through ICU admission. No clear source of bleeding. Iron panel does not indicate iron deficiency. Most likely due to critical illness, would continue to trend.    Discharge Exam: BP 137/92  Pulse 85  Temp 98.7 F (37.1 C) (Oral)  Resp 16  Ht 6\' 1"  (1.854 m)  Wt 235 lb 3.7 oz (106.7 kg)  BMI 31.03 kg/m2  SpO2 100% General: Awake in bed, NAD. Watching TV  Neuro: Notsedated, talkative. Oriented to person only; not place, time or situation  HEENT: Edema left eye with improved erythema. Dressing over trach site without drainage  Cardiovascular: RRR  Lungs: Bilateral air entry, improved  Abdomen: Soft, nontender, +BS  Musculoskeletal: Atraumatic. Moves all extremities  Skin: No rash. Dry skin on feet   Labs at discharge Lab Results  Component Value Date   CREATININE 1.64* 04/11/2012   BUN 26* 04/11/2012   NA 144 04/11/2012   K 3.0* 04/11/2012   CL 111 04/11/2012   CO2 22 04/11/2012   Lab Results  Component Value Date   WBC 6.6 04/11/2012   HGB 7.7* 04/11/2012   HCT 24.4* 04/11/2012   MCV 91.0 04/11/2012   PLT 164 04/11/2012   Lab Results  Component Value Date   ALT 41 04/09/2012   AST 22 04/09/2012   ALKPHOS 61 04/09/2012   BILITOT 0.2*  04/09/2012   Lab Results  Component Value Date   INR 1.02 04/01/2012   INR 1.08 03/29/2012   INR 1.14 03/28/2012    Current radiology studies Portable Chest X-ray 04/11/12- Findings: Tracheostomy in satisfactory position. PICC line has  been placed on the right side with the tip at the cavoatrial  junction. NG tube in the stomach.  Bibasilar airspace disease shows slight interval improvement. This  may be atelectasis or pneumonia.  IMPRESSION:  Right arm PICC tip in the cavoatrial junction.  Mild improvement in bibasilar atelectasis/infiltrate.  Head CT 04/09/12- IMPRESSION:  No intracranial hemorrhage.  Left hemispheric infarct not entirely excluded although this  appearance may be related to motion artifact.    Disposition:  Final discharge disposition not confirmed     Medication List     As of 04/11/2012  2:14 PM    ASK your doctor about these medications         insulin NPH-insulin regular (70-30) 100 UNIT/ML injection   Commonly known as: NOVOLIN 70/30   Inject 35 Units into the skin daily.      lisinopril 5 MG tablet   Commonly known as: PRINIVIL,ZESTRIL   Take 5 mg  by mouth daily.      metFORMIN 1000 MG tablet   Commonly known as: GLUCOPHAGE   Take 1,000 mg by mouth 2 (two) times daily with a meal.      oxycodone 30 MG immediate release tablet   Commonly known as: ROXICODONE   Take 30 mg by mouth every 4 (four) hours.      PARoxetine 40 MG tablet   Commonly known as: PAXIL   Take 40 mg by mouth every morning.      phentermine 37.5 MG tablet   Commonly known as: ADIPEX-P   Take 37.5 mg by mouth daily before breakfast.      pregabalin 300 MG capsule   Commonly known as: LYRICA   Take 300 mg by mouth 2 (two) times daily.      traZODone 150 MG tablet   Commonly known as: DESYREL   Take 150 mg by mouth at bedtime.        Discharged Condition: stable Signed: HAIR[place]ORD, AMBER 04/11/2012, 2:14 PM   Coralyn Helling, MD

## 2012-04-11 NOTE — Progress Notes (Signed)
Occupational Therapy Treatment Patient Details Name: Domonik Minerd MRN: [id number] DOB: [birth year] Today's Date: 04/11/2012 Time: 1478-2956 OT Time Calculation (min): 34 min  OT Assessment / Plan / Recommendation Comments on Treatment Session Pt. following commands consistently.  Pt. disoriented and confused.  Pt. Improving with functional mobilitiy and activity tolerance, but fatigues quickly    Follow Up Recommendations  Inpatient Rehab;Skilled nursing facility;LTACH    Barriers to Discharge       Equipment Recommendations  None recommended by OT;None recommended by PT    Recommendations for Other Services    Frequency Min 2X/week   Plan Discharge plan remains appropriate    Precautions / Restrictions Precautions Precautions: Fall Restrictions Weight Bearing Restrictions: No   Pertinent Vitals/Pain     ADL  Eating/Feeding: Supervision/safety (to drink from cup) Where Assessed - Eating/Feeding: Chair Toilet Transfer: Simulated;+2 Total assistance Toilet Transfer: Patient Percentage: 50% Toilet Transfer Method: Sit to stand;Stand pivot Acupuncturist: Other (comment) (recliner) Transfers/Ambulation Related to ADLs: Sit to stand with total A (pt 60%) and pt 50% for transfer to chair    OT Diagnosis:    OT Problem List:   OT Treatment Interventions:     OT Goals ADL Goals ADL Goal: Grooming - Progress: Progressing toward goals ADL Goal: Upper Body Bathing - Progress: Progressing toward goals ADL Goal: Toilet Transfer - Progress: Progressing toward goals ADL Goal: Additional Goal #1 - Progress: Progressing toward goals  Visit Information  Last OT Received On: 04/11/12 Assistance Needed: +2 PT/OT Co-Evaluation/Treatment: Yes    Subjective Data      Prior Functioning       Cognition  Overall Cognitive Status: Impaired Area of Impairment: Attention;Memory;Following commands;Safety/judgement;Awareness of errors;Awareness of deficits Arousal/Alertness:  Awake/alert Orientation Level: Disoriented to;Time;Situation Behavior During Session: [facility] for tasks performed Current Attention Level: Sustained Memory Deficits: pt with difficultyw ith STM.   Following Commands: Follows one step commands consistently Safety/Judgement: Decreased safety judgement for tasks assessed Awareness of Errors: Assistance required to identify errors made;Assistance required to correct errors made    Mobility  Shoulder Instructions Bed Mobility Bed Mobility: Supine to Sit;Sitting - Scoot to Edge of Bed Supine to Sit: 4: Min assist Sitting - Scoot to Edge of Bed: 2: Max assist Details for Bed Mobility Assistance: cues for sequencing, safe technique, encouragement.   Transfers Transfers: Sit to Stand;Stand to Sit Sit to Stand: 1: +2 Total assist;With upper extremity assist;From bed Sit to Stand: Patient Percentage: 60% Stand to Sit: 1: +2 Total assist;With upper extremity assist;To chair/3-in-1 Stand to Sit: Patient Percentage: 50% Details for Transfer Assistance: pt following directions well, needs A and cueing for upright posture with trunk/hip extension.  pt fatigues quickly.         Exercises      Balance Balance Balance Assessed: Yes Static Sitting Balance Static Sitting - Balance Support: Feet supported;Bilateral upper extremity supported Static Sitting - Level of Assistance: 5: Stand by assistance Static Standing Balance Static Standing - Balance Support: Bilateral upper extremity supported Static Standing - Level of Assistance: 1: +2 Total assist Static Standing - Comment/# of Minutes: pt able to maintain standing with +2 pt 70%.  pt fatigues quickly, but was able to perform steps in place x45seconds before needing to sit.  Then able to stand a second time to complete transfer.     End of Session OT - End of Session Equipment Utilized During Treatment: Gait belt Activity Tolerance: Patient tolerated treatment well Patient left: in chair;with call  bell/phone within  reach;with family/visitor present Nurse Communication: Mobility status  GO     Jeani Hawking M 04/11/2012, 3:06 PM

## 2012-04-11 NOTE — Progress Notes (Signed)
Clinical Social Work Department CLINICAL SOCIAL WORK PLACEMENT NOTE 04/11/2012  Patient:  Gregory [name], Gregory [name]  Account Number:  [id number] Admit date:  03/25/2012  Clinical Social Worker:  Ronda Fairly, CLINICAL SOCIAL WORKER  Date/time:  04/11/2012 03:21 PM  Clinical Social Work is seeking post-discharge placement for this patient at the following level of care:   SKILLED NURSING   (*CSW will update this form in Epic as items are completed)   04/11/2012  Patient/family provided with [facility] Department of Clinical Social Work's list of facilities offering this level of care within the geographic area requested by the patient (or if unable, by the patient's family).  04/11/2012  Patient/family informed of their freedom to choose among providers that offer the needed level of care, that participate in Medicare, Medicaid or managed care program needed by the patient, have an available bed and are willing to accept the patient.  04/11/2012  Patient/family informed of [facility]' ownership interest in [facility], as well as of the fact that they are under no obligation to receive care at this facility.  PASARR submitted to EDS on 04/11/2012 PASARR number received from EDS on 04/11/2012  FL2 transmitted to all facilities in geographic area requested by pt/family on  04/11/2012 FL2 transmitted to all facilities within larger geographic area on   Patient informed that his/her managed care company has contracts with or will negotiate with  certain facilities, including the following:     Patient/family informed of bed offers received:   Patient chooses bed at  Physician recommends and patient chooses bed at    Patient to be transferred to  on   Patient to be transferred to facility by   The following physician request were entered in Epic:   Additional Comments:

## 2012-04-11 NOTE — Procedures (Signed)
Objective Swallowing Evaluation: Modified Barium Swallowing Study  Patient Details  Name: Gregory [name] MRN: [id number] Date of Birth: Mar 31, 1961  Today's Date: 04/11/2012 Time: 2130-8657 SLP Time Calculation (min): 23 min  Past Medical History:  Past Medical History  Diagnosis Date  . Diabetes mellitus without complication   . Hypertension    Past Surgical History: History reviewed. No pertinent past surgical history. HPI:  51 y.o. male admitted to the hospital on 03/25/12 for respiratory failure and shock. Since admission, blood cultures and respiratory cultures grew positive for strep pneumococcus, patient suffered from STEMI likely secondary to shock,  ATN and ARF (improving), Labile BG,  hypernatremia (from 10/18 to current), hyperchloremia (10/18-current). Over last 24 hours patient has shown a acute change in mental status. Initially believed to be medication, Risperdal, Methadone and Ativan were all decreased.  There was a improvement in his mental status after these medications were decreased. However, CT of brain was obtained and showed questionable left hemispheric infarct with question of artifact; unable to participate in MRI - pt with likely acute encephalopathy.   Intubated 10/8, trached 10/17; decannulated 10/24.        Assessment / Plan / Recommendation Clinical Impression  Dysphagia Diagnosis: Within Functional Limits Clinical impression: Patient presents with a functional oropharyngeal swallow. Timing of swallowing initiation and strength of swallow both appear WFL. Full airway protection noted. Recommend resuming a regular diet, thin liquids with continued general safe swallowing precautions to decrease aspiration risks due to prolonged intubation. SLP will f/u briefly to ensure tolerance at bedside.     Treatment Recommendation  Therapy as outlined in treatment plan below    Diet Recommendation Regular;Thin liquid   Liquid Administration via: Cup;Straw Medication  Administration: Whole meds with liquid Supervision: Patient able to self feed;Intermittent supervision to cue for compensatory strategies Compensations: Slow rate;Small sips/bites Postural Changes and/or Swallow Maneuvers: Seated upright 90 degrees    Other  Recommendations Oral Care Recommendations: Oral care BID   Follow Up Recommendations  Inpatient Rehab    Frequency and Duration min 2x/week  2 weeks   Pertinent Vitals/Pain n/a    SLP Swallow Goals Patient will utilize recommended strategies during swallow to increase swallowing safety with: Supervision/safety Swallow Study Goal #2 - Progress: Progressing toward goal   General HPI: 51 y.o. male admitted to the hospital on 03/25/12 for respiratory failure and shock. Since admission, blood cultures and respiratory cultures grew positive for strep pneumococcus, patient suffered from STEMI likely secondary to shock,  ATN and ARF (improving), Labile BG,  hypernatremia (from 10/18 to current), hyperchloremia (10/18-current). Over last 24 hours patient has shown a acute change in mental status. Initially believed to be medication, Risperdal, Methadone and Ativan were all decreased.  There was a improvement in his mental status after these medications were decreased. However, CT of brain was obtained and showed questionable left hemispheric infarct with question of artifact; unable to participate in MRI - pt with likely acute encephalopathy.   Intubated 10/8, trached 10/17; decannulated 10/24.    Type of Study: Modified Barium Swallowing Study Reason for Referral: Objectively evaluate swallowing function Diet Prior to this Study: Dysphagia 3 (soft);Nectar-thick liquids Temperature Spikes Noted: No Respiratory Status: Supplemental O2 delivered via (comment) History of Recent Intubation: Yes Length of Intubations (days): 9 days Date extubated:  (trached 10/17, decannulated 10/24) Behavior/Cognition: Alert;Cooperative;Confused;Impulsive Oral  Cavity - Dentition: Adequate natural dentition Oral Motor / Sensory Function: Within functional limits Self-Feeding Abilities: Able to feed self Patient Positioning: Upright in bed Baseline  Vocal Quality: Clear Volitional Cough: Strong Volitional Swallow: Able to elicit Anatomy: Within functional limits Pharyngeal Secretions: Not observed secondary MBS    Reason for Referral Objectively evaluate swallowing function   Oral Phase Oral Preparation/Oral Phase Oral Phase: WFL   Pharyngeal Phase Pharyngeal Phase Pharyngeal Phase: Within functional limits  Cervical Esophageal Phase    GO   Gregory Lango MA, CCC-SLP [phone]  Cervical Esophageal Phase Cervical Esophageal Phase: [facility]         Gregory [name] Meryl 04/11/2012, 1:48 PM

## 2012-04-11 NOTE — Progress Notes (Signed)
Speech Language Pathology Dysphagia Treatment Patient Details Name: Gregory [name] MRN: [id number] DOB: 24-Mar-1961 Today's Date: 04/11/2012 Time: 4098-1191 SLP Time Calculation (min): 23 min  Assessment / Plan / Recommendation Clinical Impression  Treatment focused on differential diagnosis of dysphagia to determine potential for diet advancement. Patient eager to advance to thin liquids during meals. Impulsive with intake requiring max SLP cueing to slow rate of intake. Relatively consistent throat clearing noted post swallow with thin liquids despite clear phonation.  This is impaired airway protection when compared with trials of thickened liquids and solids at bedside. Given multiple risk factors for aspiration including prolonged intubation, trach, now with decannulation, will proceed with objective evaluation to determine potential to advance. Overall, prognosis to advance diet with compensatory strategies good.     Diet Recommendation  Continue with Current Diet: Dysphagia 3 (mechanical soft);Nectar-thick liquid (thin liquids via small single sips in between meals)    SLP Plan MBS   Pertinent Vitals/Pain n/a   Swallowing Goals  SLP Swallowing Goals Patient will utilize recommended strategies during swallow to increase swallowing safety with: Supervision/safety Swallow Study Goal #2 - Progress: Progressing toward goal  General Temperature Spikes Noted: No Respiratory Status: Supplemental O2 delivered via (comment) Behavior/Cognition: Alert;Cooperative;Confused;Impulsive Oral Cavity - Dentition: Adequate natural dentition Patient Positioning: Upright in bed  Dysphagia Treatment Treatment focused on: Skilled observation of diet tolerance;Upgraded PO texture trials;Utilization of compensatory strategies;Patient/family/caregiver education Family/Caregiver Educated: daughter Treatment Methods/Modalities: Skilled observation;Differential diagnosis Patient observed directly with PO's:  Yes Type of PO's observed: Thin liquids;Nectar-thick liquids;Dysphagia 3 (soft) Feeding: Able to feed self Liquids provided via: Cup Pharyngeal Phase Signs & Symptoms: Immediate throat clear Type of cueing: Tactile;Verbal Amount of cueing: Maximal   GO    Ferdinand Lango MA, CCC-SLP [phone]  Quillan Whitter Meryl 04/11/2012, 9:44 AM

## 2012-04-11 NOTE — Progress Notes (Signed)
Case Management reports patient is being evaluated by [facility] and requested CSW do SNF as additional option. CSW, [employer] and Clinical research associate spoke with patient and daughter, Gregory [name]  Patient lives alone without 24 hour care, open to [facility] or SNF, medicaid pending. Family prefers placement in [place] or [place]. CSW explained options open to contract services can be somewhat limited.  Will submit FL2.  Gregory [name] 04/11/2012 2:09 PM

## 2012-04-11 NOTE — Progress Notes (Signed)
ANTIBIOTIC CONSULT NOTE - FOLLOW UP  Pharmacy Consult for Vancomycin and Zosyn Indication: pneumonia  No Known Allergies  Patient Measurements: Height: 6\' 1"  (185.4 cm) Weight: 235 lb 3.7 oz (106.7 kg) IBW/kg (Calculated) : 79.9  Adjusted Body Weight: 90.6 kg  Vital Signs: Temp: 98.7 F (37.1 C) (10/25 0750) Temp src: Oral (10/25 0750) BP: 144/80 mmHg (10/25 1000) Intake/Output from previous day: 10/24 0701 - 10/25 0700 In: 4690 [P.O.:720; I.V.:2480; NG/GT:1340; IV Piggyback:150] Out: 3950 [Urine:3225; Stool:725]  Labs:  Basename 04/11/12 0320 04/10/12 2100 04/10/12 0440 04/09/12 0447  WBC 6.6 -- 7.0 6.7  HGB 7.7* -- 8.0* 7.9*  PLT 164 -- 178 182  LABCREA -- -- -- --  CREATININE 1.64* 1.71* 1.76* --   Estimated Creatinine Clearance: 67.5 ml/min (by C-G formula based on Cr of 1.64).    Microbiology: Recent Results (from the past 720 hour(s))  URINE CULTURE     Status: Normal   Collection Time   03/25/12  4:50 PM      Component Value Range Status Comment   Specimen Description URINE, CATHETERIZED   Final    Special Requests NONE   Final    Culture  Setup Time 03/25/2012 17:58   Final    Colony Count NO GROWTH   Final    Culture NO GROWTH   Final    Report Status 03/26/2012 FINAL   Final   CULTURE, BLOOD (ROUTINE X 2)     Status: Normal   Collection Time   03/25/12  4:55 PM      Component Value Range Status Comment   Specimen Description BLOOD ARM RIGHT   Final    Special Requests     Final    Value: BOTTLES DRAWN AEROBIC AND ANAEROBIC BLUE 10CC RED 5CC   Culture  Setup Time 03/26/2012 01:32   Final    Culture     Final    Value: STREPTOCOCCUS PNEUMONIAE     Note: Gram Stain Report Called to,Read Back By and Verified With: [facility] WHITE @ 1519 03/26/12 BY KRAWS   Report Status 03/28/2012 FINAL   Final    Organism ID, Bacteria STREPTOCOCCUS PNEUMONIAE   Final   CULTURE, BLOOD (ROUTINE X 2)     Status: Normal   Collection Time   03/25/12  5:15 PM      Component  Value Range Status Comment   Specimen Description BLOOD HAND RIGHT   Final    Special Requests BOTTLES DRAWN AEROBIC ONLY 4CC   Final    Culture  Setup Time 03/26/2012 01:32   Final    Culture     Final    Value: STREPTOCOCCUS PNEUMONIAE     Note: SUSCEPTIBILITIES PERFORMED ON PREVIOUS CULTURE WITHIN THE LAST 5 DAYS.     Note: Gram Stain Report Called to,Read Back By and Verified With: [facility] WHITE @ 1519 03/26/12 BY KRAWS   Report Status 03/28/2012 FINAL   Final   MRSA PCR SCREENING     Status: Normal   Collection Time   03/25/12  8:58 PM      Component Value Range Status Comment   MRSA by PCR NEGATIVE  NEGATIVE Final   CULTURE, RESPIRATORY     Status: Normal   Collection Time   03/25/12  9:15 PM      Component Value Range Status Comment   Specimen Description TRACHEAL ASPIRATE   Final    Special Requests NONE   Final    Gram Stain  Final    Value: MODERATE WBC PRESENT, PREDOMINANTLY PMN     NO SQUAMOUS EPITHELIAL CELLS SEEN     FEW GRAM POSITIVE RODS     FEW GRAM POSITIVE COCCI IN PAIRS     IN CHAINS RARE GRAM NEGATIVE RODS   Culture     Final    Value: MODERATE STREPTOCOCCUS PNEUMONIAE     FEW CANDIDA ALBICANS   Report Status 03/29/2012 FINAL   Final    Organism ID, Bacteria STREPTOCOCCUS PNEUMONIAE   Final   CLOSTRIDIUM DIFFICILE BY PCR     Status: Normal   Collection Time   04/04/12 11:17 PM      Component Value Range Status Comment   C difficile by pcr NEGATIVE  NEGATIVE Final   CULTURE, BLOOD (ROUTINE X 2)     Status: Normal (Preliminary result)   Collection Time   04/05/12 10:44 AM      Component Value Range Status Comment   Specimen Description BLOOD HAND RIGHT   Final    Special Requests BOTTLES DRAWN AEROBIC AND ANAEROBIC 1CC EA   Final    Culture  Setup Time 04/06/2012 11:40   Final    Culture     Final    Value:        BLOOD CULTURE RECEIVED NO GROWTH TO DATE CULTURE WILL BE HELD FOR 5 DAYS BEFORE ISSUING A FINAL NEGATIVE REPORT   Report Status PENDING    Incomplete   CULTURE, BLOOD (ROUTINE X 2)     Status: Normal   Collection Time   04/05/12 10:50 AM      Component Value Range Status Comment   Specimen Description BLOOD HAND LEFT   Final    Special Requests BOTTLES DRAWN AEROBIC ONLY 5CC   Final    Culture  Setup Time 04/05/2012 18:52   Final    Culture NO GROWTH 5 DAYS   Final    Report Status 04/11/2012 FINAL   Final   CULTURE, RESPIRATORY     Status: Normal   Collection Time   04/05/12  2:23 PM      Component Value Range Status Comment   Specimen Description TRACHEAL ASPIRATE   Final    Special Requests NONE   Final    Gram Stain     Final    Value: NO WBC SEEN     RARE SQUAMOUS EPITHELIAL CELLS PRESENT     RARE GRAM POSITIVE COCCI     IN PAIRS   Culture Non-Pathogenic Oropharyngeal-type Flora Isolated.   Final    Report Status 04/08/2012 FINAL   Final   URINE CULTURE     Status: Normal   Collection Time   04/05/12  2:31 PM      Component Value Range Status Comment   Specimen Description URINE, RANDOM   Final    Special Requests Normal   Final    Culture  Setup Time 04/05/2012 22:00   Final    Colony Count NO GROWTH   Final    Culture NO GROWTH   Final    Report Status 04/06/2012 FINAL   Final     Anti-infectives     Start     Dose/Rate Route Frequency Ordered Stop   04/11/12 1100   vancomycin (VANCOCIN) IVPB 1000 mg/200 mL premix        1,000 mg 200 mL/hr over 60 Minutes Intravenous Every 12 hours 04/11/12 1033 04/14/12 2359   04/08/12 1100   dextrose 5 % 500 mL  with vancomycin (VANCOCIN) 1,500 mg infusion  Status:  Discontinued        250 mL/hr  Intravenous Every 24 hours 04/08/12 0828 04/11/12 1033   04/05/12 1100   vancomycin (VANCOCIN) 1,500 mg in sodium chloride 0.9 % 500 mL IVPB  Status:  Discontinued        1,500 mg 250 mL/hr over 120 Minutes Intravenous Every 24 hours 04/05/12 1031 04/08/12 0820   04/05/12 1100   piperacillin-tazobactam (ZOSYN) IVPB 3.375 g        3.375 g 12.5 mL/hr over 240 Minutes  Intravenous Every 8 hours 04/05/12 1031 04/14/12 2359   04/04/12 2200   levofloxacin (LEVAQUIN) IVPB 500 mg        500 mg 100 mL/hr over 60 Minutes Intravenous Every 48 hours 04/03/12 0827 04/04/12 2200   04/02/12 2200   levofloxacin (LEVAQUIN) IVPB 500 mg        500 mg 100 mL/hr over 60 Minutes Intravenous Every 48 hours 04/02/12 1120 04/02/12 2311   03/28/12 2200   Levofloxacin (LEVAQUIN) IVPB 250 mg  Status:  Discontinued        250 mg 50 mL/hr over 60 Minutes Intravenous Every 24 hours 03/28/12 1339 04/02/12 1120   03/28/12 1130   cefTRIAXone (ROCEPHIN) 2 g in dextrose 5 % 50 mL IVPB  Status:  Discontinued        2 g 100 mL/hr over 30 Minutes Intravenous Every 12 hours 03/28/12 1126 03/28/12 1128   03/27/12 2200   vancomycin (VANCOCIN) 1,250 mg in sodium chloride 0.9 % 250 mL IVPB  Status:  Discontinued        1,250 mg 166.7 mL/hr over 90 Minutes Intravenous Every 24 hours 03/27/12 1452 03/28/12 1126   03/27/12 2200   Levofloxacin (LEVAQUIN) IVPB 250 mg  Status:  Discontinued        250 mg 50 mL/hr over 60 Minutes Intravenous Every 24 hours 03/27/12 1740 03/28/12 1126   03/27/12 2200   piperacillin-tazobactam (ZOSYN) IVPB 3.375 g  Status:  Discontinued        3.375 g 12.5 mL/hr over 240 Minutes Intravenous 4 times per day 03/27/12 1740 03/28/12 1126   03/27/12 2030   levofloxacin (LEVAQUIN) IVPB 750 mg  Status:  Discontinued        750 mg 100 mL/hr over 90 Minutes Intravenous Every 48 hours 03/25/12 2007 03/27/12 1740   03/26/12 1600   oseltamivir (TAMIFLU) capsule 75 mg  Status:  Discontinued        75 mg Oral Daily 03/26/12 1442 03/27/12 0824   03/26/12 1400   piperacillin-tazobactam (ZOSYN) IVPB 3.375 g  Status:  Discontinued        3.375 g 12.5 mL/hr over 240 Minutes Intravenous 3 times per day 03/26/12 0907 03/27/12 1740   03/26/12 0230   piperacillin-tazobactam (ZOSYN) IVPB 2.25 g  Status:  Discontinued        2.25 g 100 mL/hr over 30 Minutes Intravenous 4 times  per day 03/25/12 2336 03/26/12 0907   03/25/12 2015   piperacillin-tazobactam (ZOSYN) IVPB 2.25 g        2.25 g 100 mL/hr over 30 Minutes Intravenous  Once 03/25/12 2005 03/25/12 2216   03/25/12 2015   vancomycin (VANCOCIN) 2,000 mg in sodium chloride 0.9 % 500 mL IVPB        2,000 mg 250 mL/hr over 120 Minutes Intravenous  Once 03/25/12 2005 03/26/12 0040   03/25/12 1945   levofloxacin (LEVAQUIN) IVPB 750 mg  Status:  Discontinued        750 mg 100 mL/hr over 90 Minutes Intravenous Every 24 hours 03/25/12 1937 03/25/12 2007   03/25/12 1645   cefTRIAXone (ROCEPHIN) 1 g in dextrose 5 % 50 mL IVPB        1 g 100 mL/hr over 30 Minutes Intravenous  Once 03/25/12 1634 03/25/12 1729   03/25/12 1645   azithromycin (ZITHROMAX) 500 mg in dextrose 5 % 250 mL IVPB        500 mg 250 mL/hr over 60 Minutes Intravenous  Once 03/25/12 1634 03/25/12 1815          Assessment: 50 YOM admitted on October 8th who had previously completed a course of antibiotics for suspected community-acquired pneumonia, who was spiking fevers again and his antibiotics had been restarted. He also had ARF which has been resolving since stopping CVVHD on 10/16. His current SCr is 1.64 with an estimated CrCl of ~65mL/min. He is currently afebrile with a normalized WBC. His sodium and chloride are now normal.  Goal of Therapy:  Vancomycin trough level 15-20 mcg/ml  Plan:  1. Continue Zosyn at 3.375gm IV Q8hr extended interval dosing with a stop date of 10/28 (10 total days of therapy) 2. Change vancomycin to 1000mg  IV Q12hr and switch fluid back to normal saline as his sodium and chloride have now normalized with a stop date of 10/28 (10 total days of therapy) 3. Continue to follow clinical progression and renal function and make adjustments if necessary   Deliah Strehlow D. Reford Olliff, PharmD Clinical Pharmacist Pager: [phone] Phone: [phone] 04/11/2012 10:48 AM

## 2012-04-11 NOTE — Progress Notes (Signed)
Subjective: Patient continues to improve.  More alert today.  By report of daughter able to recognize everyone, even knows nicknames.  Knows the day of the week but is still confused at times.  Today working with therapy and easily distracted.    Objective: Current vital signs: BP 137/92  Pulse 85  Temp 98.7 F (37.1 C) (Oral)  Resp 16  Ht 6\' 1"  (1.854 m)  Wt 106.7 kg (235 lb 3.7 oz)  BMI 31.03 kg/m2  SpO2 100% Vital signs in last 24 hours: Temp:  [97.1 F (36.2 C)-101.1 F (38.4 C)] 98.7 F (37.1 C) (10/25 1200) Pulse Rate:  [85] 85  (10/24 1618) Resp:  [14-29] 16  (10/25 1200) BP: (120-151)/(70-105) 137/92 mmHg (10/25 1200) SpO2:  [97 %-100 %] 100 % (10/25 1200) Weight:  [106.7 kg (235 lb 3.7 oz)] 106.7 kg (235 lb 3.7 oz) (10/25 0207)  Intake/Output from previous day: 10/24 0701 - 10/25 0700 In: 4690 [P.O.:720; I.V.:2480; NG/GT:1340; IV Piggyback:150] Out: 3950 [Urine:3225; Stool:725] Intake/Output this shift: Total I/O In: 400 [P.O.:100; I.V.:300] Out: 650 [Urine:625; Stool:25] Nutritional status: Dysphagia  Neurologic Exam: Mental Status: Alert.  Knows the day of the week but thinks it is night and it is morning.  Speech fluent without evidence of aphasia but some slurring noted.  Able to follow commands. Cranial Nerves: II: Discs flat bilaterally; Visual fields grossly normal, pupils equal, round, reactive to light and accommodation III,IV, VI: ptosis not present, extra-ocular motions intact bilaterally V,VII: mild right facial asymmetry, facial light touch sensation normal bilaterally VIII: hearing normal bilaterally IX,X: gag reflex present XI: bilateral shoulder shrug XII: midline tongue extension Motor: Moves all extremities symmetrically Sensory: Pinprick and light touch intact throughout, bilaterally   Lab Results: Basic Metabolic Panel:  Lab 04/11/12 1478 04/10/12 2100 04/10/12 0440 04/09/12 1446 04/09/12 0447 04/07/12 0535 04/06/12 0452 04/05/12 0400   NA 144 145 152* 156* 158* -- -- --  K 3.0* 3.1* 2.7* 3.1* 2.8* -- -- --  CL 111 113* 118* 121* 124* -- -- --  CO2 22 22 21 23 24  -- -- --  GLUCOSE 181* 209* 200* 214* 180* -- -- --  BUN 26* 29* 37* 43* 49* -- -- --  CREATININE 1.64* 1.71* 1.76* 1.92* 2.04* -- -- --  CALCIUM 8.3* 8.3* 8.2* -- -- -- -- --  MG -- 1.6 -- 1.3* -- 1.6 -- --  PHOS -- 3.8 -- 4.0 -- 3.6 5.0* 6.3*    Liver Function Tests:  Lab 04/09/12 0447 04/06/12 0452 04/05/12 0400  AST 22 -- --  ALT 41 -- --  ALKPHOS 61 -- --  BILITOT 0.2* -- --  PROT 6.5 -- --  ALBUMIN 2.2* 2.3* 2.4*   No results found for this basename: LIPASE:5,AMYLASE:5 in the last 168 hours No results found for this basename: AMMONIA:3 in the last 168 hours  CBC:  Lab 04/11/12 0320 04/10/12 0440 04/09/12 0447 04/08/12 0500 04/07/12 0535  WBC 6.6 7.0 6.7 7.7 7.3  NEUTROABS -- -- -- -- --  HGB 7.7* 8.0* 7.9* 8.5* 8.8*  HCT 24.4* 26.0* 26.2* 29.0* 29.2*  MCV 91.0 93.9 96.7 99.0 97.7  PLT 164 178 182 204 194    Cardiac Enzymes: No results found for this basename: CKTOTAL:5,CKMB:5,CKMBINDEX:5,TROPONINI:5 in the last 168 hours  Lipid Panel: No results found for this basename: CHOL:5,TRIG:5,HDL:5,CHOLHDL:5,VLDL:5,LDLCALC:5 in the last 168 hours  CBG:  Lab 04/11/12 1126 04/11/12 0740 04/11/12 0428 04/11/12 0038 04/10/12 1942  GLUCAP 184* 164* 167* 190* 206*  Microbiology: Results for orders placed during the hospital encounter of 03/25/12  URINE CULTURE     Status: Normal   Collection Time   03/25/12  4:50 PM      Component Value Range Status Comment   Specimen Description URINE, CATHETERIZED   Final    Special Requests NONE   Final    Culture  Setup Time 03/25/2012 17:58   Final    Colony Count NO GROWTH   Final    Culture NO GROWTH   Final    Report Status 03/26/2012 FINAL   Final   CULTURE, BLOOD (ROUTINE X 2)     Status: Normal   Collection Time   03/25/12  4:55 PM      Component Value Range Status Comment   Specimen  Description BLOOD ARM RIGHT   Final    Special Requests     Final    Value: BOTTLES DRAWN AEROBIC AND ANAEROBIC BLUE 10CC RED 5CC   Culture  Setup Time 03/26/2012 01:32   Final    Culture     Final    Value: STREPTOCOCCUS PNEUMONIAE     Note: Gram Stain Report Called to,Read Back By and Verified With: [facility] WHITE @ 1519 03/26/12 BY KRAWS   Report Status 03/28/2012 FINAL   Final    Organism ID, Bacteria STREPTOCOCCUS PNEUMONIAE   Final   CULTURE, BLOOD (ROUTINE X 2)     Status: Normal   Collection Time   03/25/12  5:15 PM      Component Value Range Status Comment   Specimen Description BLOOD HAND RIGHT   Final    Special Requests BOTTLES DRAWN AEROBIC ONLY 4CC   Final    Culture  Setup Time 03/26/2012 01:32   Final    Culture     Final    Value: STREPTOCOCCUS PNEUMONIAE     Note: SUSCEPTIBILITIES PERFORMED ON PREVIOUS CULTURE WITHIN THE LAST 5 DAYS.     Note: Gram Stain Report Called to,Read Back By and Verified With: [facility] WHITE @ 1519 03/26/12 BY KRAWS   Report Status 03/28/2012 FINAL   Final   MRSA PCR SCREENING     Status: Normal   Collection Time   03/25/12  8:58 PM      Component Value Range Status Comment   MRSA by PCR NEGATIVE  NEGATIVE Final   CULTURE, RESPIRATORY     Status: Normal   Collection Time   03/25/12  9:15 PM      Component Value Range Status Comment   Specimen Description TRACHEAL ASPIRATE   Final    Special Requests NONE   Final    Gram Stain     Final    Value: MODERATE WBC PRESENT, PREDOMINANTLY PMN     NO SQUAMOUS EPITHELIAL CELLS SEEN     FEW GRAM POSITIVE RODS     FEW GRAM POSITIVE COCCI IN PAIRS     IN CHAINS RARE GRAM NEGATIVE RODS   Culture     Final    Value: MODERATE STREPTOCOCCUS PNEUMONIAE     FEW CANDIDA ALBICANS   Report Status 03/29/2012 FINAL   Final    Organism ID, Bacteria STREPTOCOCCUS PNEUMONIAE   Final   CLOSTRIDIUM DIFFICILE BY PCR     Status: Normal   Collection Time   04/04/12 11:17 PM      Component Value Range Status Comment    C difficile by pcr NEGATIVE  NEGATIVE Final   CULTURE, BLOOD (ROUTINE X 2)  Status: Normal (Preliminary result)   Collection Time   04/05/12 10:44 AM      Component Value Range Status Comment   Specimen Description BLOOD HAND RIGHT   Final    Special Requests BOTTLES DRAWN AEROBIC AND ANAEROBIC 1CC EA   Final    Culture  Setup Time 04/06/2012 11:40   Final    Culture     Final    Value:        BLOOD CULTURE RECEIVED NO GROWTH TO DATE CULTURE WILL BE HELD FOR 5 DAYS BEFORE ISSUING A FINAL NEGATIVE REPORT   Report Status PENDING   Incomplete   CULTURE, BLOOD (ROUTINE X 2)     Status: Normal   Collection Time   04/05/12 10:50 AM      Component Value Range Status Comment   Specimen Description BLOOD HAND LEFT   Final    Special Requests BOTTLES DRAWN AEROBIC ONLY 5CC   Final    Culture  Setup Time 04/05/2012 18:52   Final    Culture NO GROWTH 5 DAYS   Final    Report Status 04/11/2012 FINAL   Final   CULTURE, RESPIRATORY     Status: Normal   Collection Time   04/05/12  2:23 PM      Component Value Range Status Comment   Specimen Description TRACHEAL ASPIRATE   Final    Special Requests NONE   Final    Gram Stain     Final    Value: NO WBC SEEN     RARE SQUAMOUS EPITHELIAL CELLS PRESENT     RARE GRAM POSITIVE COCCI     IN PAIRS   Culture Non-Pathogenic Oropharyngeal-type Flora Isolated.   Final    Report Status 04/08/2012 FINAL   Final   URINE CULTURE     Status: Normal   Collection Time   04/05/12  2:31 PM      Component Value Range Status Comment   Specimen Description URINE, RANDOM   Final    Special Requests Normal   Final    Culture  Setup Time 04/05/2012 22:00   Final    Colony Count NO GROWTH   Final    Culture NO GROWTH   Final    Report Status 04/06/2012 FINAL   Final     Coagulation Studies: No results found for this basename: LABPROT:5,INR:5 in the last 72 hours  Imaging: MRI pending  Medications:  I have reviewed the patient's current  medications. Scheduled:   . antiseptic oral rinse  1 application Mouth Rinse QID  . chlorhexidine  15 mL Mouth/Throat BID  . ciprofloxacin  2 drop Both Eyes Q4H while awake  . famotidine  20 mg Oral BID  . heparin subcutaneous  5,000 Units Subcutaneous Q8H  . hydrALAZINE  25 mg Per Tube Q8H  . insulin aspart  0-15 Units Subcutaneous TID WC  . insulin aspart  0-5 Units Subcutaneous QHS  . insulin glargine  20 Units Subcutaneous Daily  . LORazepam  2 mg Per Tube Q8H  . methadone  10 mg Per Tube Q12H  . piperacillin-tazobactam (ZOSYN)  IV  3.375 g Intravenous Q8H  . potassium chloride  40 mEq Per Tube Once  . potassium chloride  40 mEq Oral Once  . sodium chloride  10-40 mL Intracatheter Q12H  . vancomycin  1,000 mg Intravenous Q12H  . DISCONTD: albuterol  2.5 mg Nebulization Q6H  . DISCONTD: albuterol-ipratropium  6 puff Inhalation Q6H  . DISCONTD: dextrose 5 %  500 mL with vancomycin (VANCOCIN) 1,500 mg infusion   Intravenous Q24H  . DISCONTD: famotidine  20 mg Per Tube BID  . DISCONTD: feeding supplement (OXEPA)  1,000 mL Per Tube Q24H  . DISCONTD: feeding supplement  60 mL Per Tube TID  . DISCONTD: free water  300 mL Per Tube Q2H  . DISCONTD: insulin aspart  0-15 Units Subcutaneous Q4H  . DISCONTD: ipratropium  0.5 mg Nebulization Q6H    Assessment/Plan:  Patient Active Hospital Problem List:  Acute encephalopathy (03/25/2012)   Assessment: Patient continues to improve.  CT with questionable left BG infarct.  Exam with mild right facial droop.  MR unable to be performed at this time.  Echo 10/9 unremarkable for intracardiac masses or thrombi.   Plan: 1. Once able would start patient on an aspirin a day.    2. Carotid doppler    LOS: 17 days   Thana Farr, MD [facility] [phone] 04/11/2012  1:43 PM

## 2012-04-11 NOTE — Progress Notes (Signed)
Physical Therapy Treatment Patient Details Name: Gregory [name] MRN: [id number] DOB: 1961/04/03 Today's Date: 04/11/2012 Time: 4098-1191 PT Time Calculation (min): 33 min  PT Assessment / Plan / Recommendation Comments on Treatment Session  pt presents with PNA, Sepsis, and VDRF.  pt with much better participation today and able to improve overall activity level.  Feel pt will continue to make good progress.      Follow Up Recommendations  Post acute inpatient     Does the patient have the potential to tolerate intense rehabilitation  Yes, Recommend IP Rehab Screening  Barriers to Discharge        Equipment Recommendations  None recommended by PT    Recommendations for Other Services    Frequency Min 3X/week   Plan Discharge plan remains appropriate;Frequency remains appropriate    Precautions / Restrictions Precautions Precautions: Fall Restrictions Weight Bearing Restrictions: No   Pertinent Vitals/Pain Denies pain.      Mobility  Bed Mobility Bed Mobility: Supine to Sit;Sitting - Scoot to Edge of Bed Supine to Sit: 4: Min assist Sitting - Scoot to Edge of Bed: 2: Max assist Details for Bed Mobility Assistance: cues for sequencing, safe technique, encouragement.   Transfers Transfers: Sit to Stand;Stand to Sit;Stand Pivot Transfers Sit to Stand: 1: +2 Total assist;With upper extremity assist;From bed Sit to Stand: Patient Percentage: 60% Stand to Sit: 1: +2 Total assist;With upper extremity assist;To chair/3-in-1 Stand to Sit: Patient Percentage: 50% Stand Pivot Transfers: 1: +2 Total assist Stand Pivot Transfers: Patient Percentage: 50% Details for Transfer Assistance: pt following directions well, needs A and cueing for upright posture with trunk/hip extension.  pt fatigues quickly.   Ambulation/Gait Ambulation/Gait Assistance: Not tested (comment) Stairs: No Wheelchair Mobility Wheelchair Mobility: No    Exercises     PT Diagnosis:    PT Problem List:   PT  Treatment Interventions:     PT Goals Acute Rehab PT Goals Time For Goal Achievement: 04/21/12 Pt will go Supine/Side to Sit: with modified independence PT Goal: Supine/Side to Sit - Progress: Updated due to goal met PT Goal: Sit to Stand - Progress: Progressing toward goal PT Goal: Stand to Sit - Progress: Progressing toward goal  Visit Information  Last PT Received On: 04/11/12 Assistance Needed: +2 PT/OT Co-Evaluation/Treatment: Yes    Subjective Data  Subjective: pt much more appropriate and participating in conversation.     Cognition  Overall Cognitive Status: Impaired Area of Impairment: Attention;Memory Arousal/Alertness: Awake/alert Orientation Level: Disoriented to;Time;Situation Behavior During Session: WFL for tasks performed Current Attention Level: Sustained Memory Deficits: pt with difficultyw ith STM.      Balance  Balance Balance Assessed: Yes Static Standing Balance Static Standing - Balance Support: Bilateral upper extremity supported Static Standing - Level of Assistance: 1: +2 Total assist Static Standing - Comment/# of Minutes: pt able to maintain standing with +2 pt 70%.  pt fatigues quickly, but was able to perform steps in place x45seconds before needing to sit.  Then able to stand a second time to complete transfer.    End of Session PT - End of Session Equipment Utilized During Treatment: Gait belt Activity Tolerance: Patient limited by fatigue Patient left: in chair;with call bell/phone within reach;with family/visitor present Nurse Communication: Mobility status   GP     Sunny Schlein, [place] [phone] 04/11/2012, 1:20 PM

## 2012-04-12 LAB — MAGNESIUM: Magnesium: 1.4 mg/dL — ABNORMAL LOW (ref 1.5–2.5)

## 2012-04-12 LAB — CBC
MCH: 28.8 pg (ref 26.0–34.0)
MCHC: 32.2 g/dL (ref 30.0–36.0)
MCV: 89.3 fL (ref 78.0–100.0)
Platelets: 167 10*3/uL (ref 150–400)
RBC: 2.71 MIL/uL — ABNORMAL LOW (ref 4.22–5.81)
RDW: 13.3 % (ref 11.5–15.5)

## 2012-04-12 LAB — COMPREHENSIVE METABOLIC PANEL
Alkaline Phosphatase: 61 U/L (ref 39–117)
BUN: 19 mg/dL (ref 6–23)
CO2: 23 mEq/L (ref 19–32)
Calcium: 8.5 mg/dL (ref 8.4–10.5)
GFR calc Af Amer: 60 mL/min — ABNORMAL LOW (ref >90)
GFR calc non Af Amer: 52 mL/min — ABNORMAL LOW (ref >90)
Glucose, Bld: 165 mg/dL — ABNORMAL HIGH (ref 70–99)
Potassium: 3.1 mEq/L — ABNORMAL LOW (ref 3.5–5.1)
Total Protein: 6.6 g/dL (ref 6.0–8.3)

## 2012-04-12 LAB — GLUCOSE, CAPILLARY: Glucose-Capillary: 143 mg/dL — ABNORMAL HIGH (ref 70–99)

## 2012-04-12 LAB — CULTURE, BLOOD (ROUTINE X 2)

## 2012-04-12 MED ORDER — DOCUSATE SODIUM 100 MG PO CAPS
200.0000 mg | ORAL_CAPSULE | Freq: Two times a day (BID) | ORAL | Status: DC | PRN
  Administered 2012-04-13: 200 mg via ORAL
  Filled 2012-04-12 (×2): qty 1

## 2012-04-12 MED ORDER — POLYETHYLENE GLYCOL 3350 17 G PO PACK
17.0000 g | PACK | Freq: Every day | ORAL | Status: DC | PRN
  Filled 2012-04-12: qty 1

## 2012-04-12 NOTE — Progress Notes (Signed)
Call placed to Dr. Sherene Sires per family request, two daughters in room very upset about dad status, what to know test result, MRI, CAT scan result.  Call received from Dr. Patience Musca With Gregory [name] daughter.

## 2012-04-12 NOTE — Progress Notes (Signed)
Physician Discharge Summary     Patient ID: Javaree Laurence MRN: [id number] DOB/AGE: March 21, 1961 51 y.o.  Admit date: 03/25/2012 Discharge date: 04/12/2012  Admission Diagnoses: Acute respiratory failure  Discharge Diagnoses:  Active Problems:  Pneumococcal pneumonia  Acute respiratory failure  Septic shock(785.52)  Diabetes  Hyperglycemia  Acute encephalopathy  Acute renal failure   Significant Hospital tests/ studies/ interventions and procedures 10/8 Brought to [facility] ED on 10/8 in respiratory distress and intubated for B infiltrates, shock  10/9 placed on ARDS  10/9 Positive blood cultures reported (strep pneumo) 10/10 HD cath placed, CVVH started  10/13 Pressors off  10/14 htn, labetolol  10/16 CVVH off  10/17 Trach placement  10/20 ATC trials  10/22 Worse mental status, Increased Na, Fever  10/22 CT head ?left hemisphere infarct. Neuro consult  10/23 Unable to obtain MRI due to restlessness  10/24 Decannulated    Hospital Course:  51 yo white male with past medical hx of obesity, presumed OHS, COPD (3ppd smoker), uncontrolled DM, uncontrolled HTN, alcohol use, opioid use who was brought to [facility] ED on 10/8 with hypotension and respiratory distress. He was then intubated for acute respiratory failure and diffuse bilateral infiltrates.   Acute respiratory failure. Pneumonia mostly likely CAP +/- ARDS. Less likely pulmonary edema. Suspected COPD (without evidence of exacerbation) - smokes 2-3 packs of cigarettes per day. Patient was started on antibiotics with little initial improvement. (see all culture and antibiotic results below) He was also on sepsis protocol, as well as ARDS protocol. Combivent was continued scheduled. Patient's fevers resolved and he clinically improved greatly. He completed course of Vanc/Zosyn. Day after completing antibiotics, fevers returned. He was restarted on antibiotics for additional 10 days. Patient was unable to wean from vent, therefore tracheostomy  was placed on 10/17. He did well with that procedure and was able to quickly wean. Therefore, he was decannulated on 10/24 and has been able to fully protect his airway on room air. At time of discharge, patient has improved  Cultures:  10/8 urine>>no growth  10/8 Blood >>> GPC in pairs >> Strep pneumo  10/8 Respiratory >>> Strep pneumo  10/9 nasal influ>> neg  10/19 BCx2> neg  10/18 resp c/s>>>nl flora  10/18 UC >>>neg  10/18 C. Diff >>neg   Antibiotics:  Azithromycin 10/8 x 1  Ceftriaxone 10/8 x 1  Zosyn 10/8 >>> 10/11  Vancomycin 10/8 >>> 10/11  Levaquin 10/8 (has best MIC vs pneumococcus at this hospital, higher than ceftriaxone) >>> 10/19  Tamiflu 10/9>>10/10  10/19 zosyn (?hcap)>>  10/19 vanco (?hcap)>>   Shock. Required pressors on admission, that were weaned off on day #5. He required volume, as well as course of stress dose steroids.  Sepsis. Patient's blood grew Strep pneumo. His echo was normal. He also had repeat blood cultures that had no growth after antibiotics.   ARF. Patient developed ARF secondary to shock. He was started on CRRT on 10/10 via right IJ HD cath, which he tolerated well. Creat improved and continuous dialysis was stopped on 10/16. Patient did not require intermittent dialysis, and his creat has remained stable.   Acute encephalopathy. Patient had encephalopathy with difficulty sedating. He required very high doses of sedation while he was on the vent as well as when he was weaning off. This could be secondary to his prior alcohol and opioid use. Neurology was consulted. Head CT did show a possible infarct, but MRI could not be completed. As he was weaning, patient was transitioned to PO Methadone,  Risperdal, and Ativan. By time of discharge, he was off all sedating medications except Ativan which was continued for alcohol use.   Shock liver. AST/ALT greatly elevated. Also has history of alcohol use. LFTs were trending and did normalize. No further  issues  A.fib with RVR. Patient developed A.fib with RVR. He was started on Diltiazem drip which controlled his rate well. He continued on the drip for multiple days, and was transitioned to PO once stabilized. Patient stayed on PO for one week, and it was d/c with no further issues.  HTN. Once pressors were weaned, patient became hypertensive. He has a history of uncontrolled HTN that was controlled with Hydralazine TID with Labetalol prn.  Hypernatremia. Unsure of etiology, perhaps polypharmacy? No intracranial reasons identified. Corrected slowly with free water, which was stopped prior to discharge  DM. Known history of uncontrolled DM. CBG controlled with SSI and Lantus 20.  Anemia. HgB trended down through ICU admission. No clear source of bleeding. Iron panel does not indicate iron deficiency. Most likely due to critical illness, would continue to trend.    Discharge Exam: BP 117/77  Pulse 94  Temp 98.8 F (37.1 C) (Oral)  Resp 22  Ht 6\' 1"  (1.854 m)  Wt 235 lb 14.3 oz (107 kg)  BMI 31.12 kg/m2  SpO2 96% General: Awake in bed, NAD. Watching TV  Neuro: Notsedated, talkative. Oriented to person only; not place, time or situation  HEENT: Edema left eye with improved erythema. Dressing over trach site without drainage  Cardiovascular: RRR  Lungs: Bilateral air entry, improved  Abdomen: Soft, nontender, +BS  Musculoskeletal: Atraumatic. Moves all extremities  Skin: No rash. Dry skin on feet   Labs at discharge Lab Results  Component Value Date   CREATININE 1.50* 04/12/2012   BUN 19 04/12/2012   NA 140 04/12/2012   K 3.1* 04/12/2012   CL 108 04/12/2012   CO2 23 04/12/2012   Lab Results  Component Value Date   WBC 6.6 04/12/2012   HGB 7.8* 04/12/2012   HCT 24.2* 04/12/2012   MCV 89.3 04/12/2012   PLT 167 04/12/2012   Lab Results  Component Value Date   ALT 32 04/12/2012   AST 21 04/12/2012   ALKPHOS 61 04/12/2012   BILITOT 0.3 04/12/2012   Lab Results   Component Value Date   INR 1.02 04/01/2012   INR 1.08 03/29/2012   INR 1.14 03/28/2012    Current radiology studies Portable Chest X-ray 04/11/12- Findings: Tracheostomy in satisfactory position. PICC line has  been placed on the right side with the tip at the cavoatrial  junction. NG tube in the stomach.  Bibasilar airspace disease shows slight interval improvement. This  may be atelectasis or pneumonia.  IMPRESSION:  Right arm PICC tip in the cavoatrial junction.  Mild improvement in bibasilar atelectasis/infiltrate.  Head CT 04/09/12- IMPRESSION:  No intracranial hemorrhage.  Left hemispheric infarct not entirely excluded although this  appearance may be related to motion artifact.    Disposition:  Final discharge disposition not confirmed     Medication List     As of 04/12/2012 12:30 PM    ASK your doctor about these medications         insulin NPH-insulin regular (70-30) 100 UNIT/ML injection   Commonly known as: NOVOLIN 70/30   Inject 35 Units into the skin daily.      lisinopril 5 MG tablet   Commonly known as: PRINIVIL,ZESTRIL   Take 5 mg by mouth daily.  metFORMIN 1000 MG tablet   Commonly known as: GLUCOPHAGE   Take 1,000 mg by mouth 2 (two) times daily with a meal.      oxycodone 30 MG immediate release tablet   Commonly known as: ROXICODONE   Take 30 mg by mouth every 4 (four) hours.      PARoxetine 40 MG tablet   Commonly known as: PAXIL   Take 40 mg by mouth every morning.      phentermine 37.5 MG tablet   Commonly known as: ADIPEX-P   Take 37.5 mg by mouth daily before breakfast.      pregabalin 300 MG capsule   Commonly known as: LYRICA   Take 300 mg by mouth 2 (two) times daily.      traZODone 150 MG tablet   Commonly known as: DESYREL   Take 150 mg by mouth at bedtime.        04/11/12 rounds Pt ready [facility] 04[facility]/12 ? May need NHP Needs ongoing OT/PT   Sandrea Hughs, MD [phone]

## 2012-04-13 LAB — GLUCOSE, CAPILLARY: Glucose-Capillary: 191 mg/dL — ABNORMAL HIGH (ref 70–99)

## 2012-04-13 MED ORDER — LORAZEPAM 1 MG PO TABS
2.0000 mg | ORAL_TABLET | Freq: Two times a day (BID) | ORAL | Status: DC
  Administered 2012-04-13 – 2012-04-14 (×3): 2 mg
  Filled 2012-04-13 (×3): qty 2

## 2012-04-13 MED ORDER — POTASSIUM CHLORIDE CRYS ER 20 MEQ PO TBCR
40.0000 meq | EXTENDED_RELEASE_TABLET | Freq: Once | ORAL | Status: AC
  Administered 2012-04-13: 40 meq via ORAL
  Filled 2012-04-13: qty 2

## 2012-04-13 MED ORDER — ALTEPLASE 2 MG IJ SOLR
2.0000 mg | Freq: Once | INTRAMUSCULAR | Status: AC
  Administered 2012-04-13: 2 mg
  Filled 2012-04-13: qty 2

## 2012-04-13 MED ORDER — PNEUMOCOCCAL VAC POLYVALENT 25 MCG/0.5ML IJ INJ
0.5000 mL | INJECTION | INTRAMUSCULAR | Status: AC
  Administered 2012-04-14: 0.5 mL via INTRAMUSCULAR
  Filled 2012-04-13: qty 0.5

## 2012-04-13 MED ORDER — OXYCODONE HCL 5 MG PO TABS
5.0000 mg | ORAL_TABLET | ORAL | Status: DC | PRN
  Administered 2012-04-15 – 2012-04-17 (×4): 5 mg via ORAL
  Filled 2012-04-13 (×9): qty 1

## 2012-04-13 NOTE — Progress Notes (Signed)
[facility] PROGRESS NOTE  Gregory [name] WUJ:[id number] DOB: Nov 30, 1960 DOA: 03/25/2012  Assessment/Plan: 1. Acute respiratory failure due to Strep pneumo bilat pneumonia with bacteremia s/p VDRF and trach - now resolved s/p iv levaquin 1 week. 2. AKI due to septic shock s/p CVVHD - resolved 3. Septic shock due to ALPS - resolved 4. Metabolic encephalopathy vs stroke - suspect ICU induced delirium 5. Polysubstance abuse prior to admission - needs clarification of type and amount 6. DM 2 - check A1C - c/w lantus and novolog 7. HTN - controlled on Hydralazine. Avoid ACEI since he just came off HD 8. Hypokalemia - replete and follow. Check magnesium  Code Status: full Family Communication: brother at bedside  Disposition Plan: CIR vs SNF    Consultants:  PCCM  CKA  Procedures: 10/8intubation and mechanical ventilation  10/9 placed on ARDS protocol 10/10 HD cath placed, CVVH started 10/10-10/16 10/17 Trach placement  10/24 Decannulated   Antibiotics: Azithromycin 10/8 x 1  Ceftriaxone 10/8 x 1  Zosyn 10/8 >>> 10/11  Vancomycin 10/8 >>> 10/11  Levaquin 10/8  >>> 10/19  Tamiflu 10/9>>10/10  10/19 zosyn (?hcap)>> 10/27 10/19 vanco (?hcap)>>10/27  HPI/Subjective: Alert but confused   Objective: Filed Vitals:   04/12/12 2022 04/13/12 0454 04/13/12 1000 04/13/12 1400  BP: 128/92 127/84 135/84 116/76  Pulse: 87 94 96 90  Temp: 97.8 F (36.6 C) 98.7 F (37.1 C) 98.7 F (37.1 C) 98.7 F (37.1 C)  TempSrc: Oral Oral Oral Oral  Resp: 20 20 18 18   Height:      Weight: 106.595 kg (235 lb)     SpO2:  98% 100% 98%    Intake/Output Summary (Last 24 hours) at 04/13/12 1510 Last data filed at 04/13/12 1300  Gross per 24 hour  Intake   1250 ml  Output   2600 ml  Net  -1350 ml   Filed Weights   04/11/12 0207 04/11/12 2033 04/12/12 2022  Weight: 106.7 kg (235 lb 3.7 oz) 107 kg (235 lb 14.3 oz) 106.595 kg (235 lb)    Exam:   General:  Alert, not oriented  x3  Cardiovascular: rrr, no M, R,G   Respiratory: ctab,   Abdomen: soft, nt   Data Reviewed: Basic Metabolic Panel:  Lab 04/12/12 9562 04/11/12 0320 04/10/12 2100 04/10/12 0440 04/09/12 1446 04/07/12 0535  NA 140 144 145 152* 156* --  K 3.1* 3.0* 3.1* 2.7* 3.1* --  CL 108 111 113* 118* 121* --  CO2 23 22 22 21 23  --  GLUCOSE 165* 181* 209* 200* 214* --  BUN 19 26* 29* 37* 43* --  CREATININE 1.50* 1.64* 1.71* 1.76* 1.92* --  CALCIUM 8.5 8.3* 8.3* 8.2* 8.3* --  MG 1.4* -- 1.6 -- 1.3* 1.6  PHOS 3.7 -- 3.8 -- 4.0 3.6   Liver Function Tests:  Lab 04/12/12 0500 04/09/12 0447  AST 21 22  ALT 32 41  ALKPHOS 61 61  BILITOT 0.3 0.2*  PROT 6.6 6.5  ALBUMIN 2.2* 2.2*   No results found for this basename: LIPASE:5,AMYLASE:5 in the last 168 hours No results found for this basename: AMMONIA:5 in the last 168 hours CBC:  Lab 04/12/12 0500 04/11/12 0320 04/10/12 0440 04/09/12 0447 04/08/12 0500  WBC 6.6 6.6 7.0 6.7 7.7  NEUTROABS -- -- -- -- --  HGB 7.8* 7.7* 8.0* 7.9* 8.5*  HCT 24.2* 24.4* 26.0* 26.2* 29.0*  MCV 89.3 91.0 93.9 96.7 99.0  PLT 167 164 178 182 204   Cardiac Enzymes:  No results found for this basename: CKTOTAL:5,CKMB:5,CKMBINDEX:5,TROPONINI:5 in the last 168 hours BNP (last 3 results)  Basename 03/25/12 1648  PROBNP 7610.0*   CBG:  Lab 04/13/12 1150 04/13/12 0744 04/12/12 2050 04/12/12 1725 04/12/12 1148  GLUCAP 191* 117* 144* 143* 140*    Recent Results (from the past 240 hour(s))  CLOSTRIDIUM DIFFICILE BY PCR     Status: Normal   Collection Time   04/04/12 11:17 PM      Component Value Range Status Comment   C difficile by pcr NEGATIVE  NEGATIVE Final   CULTURE, BLOOD (ROUTINE X 2)     Status: Normal   Collection Time   04/05/12 10:44 AM      Component Value Range Status Comment   Specimen Description BLOOD HAND RIGHT   Final    Special Requests BOTTLES DRAWN AEROBIC AND ANAEROBIC 1CC EA   Final    Culture  Setup Time 04/06/2012 11:40   Final     Culture NO GROWTH 5 DAYS   Final    Report Status 04/12/2012 FINAL   Final   CULTURE, BLOOD (ROUTINE X 2)     Status: Normal   Collection Time   04/05/12 10:50 AM      Component Value Range Status Comment   Specimen Description BLOOD HAND LEFT   Final    Special Requests BOTTLES DRAWN AEROBIC ONLY 5CC   Final    Culture  Setup Time 04/05/2012 18:52   Final    Culture NO GROWTH 5 DAYS   Final    Report Status 04/11/2012 FINAL   Final   CULTURE, RESPIRATORY     Status: Normal   Collection Time   04/05/12  2:23 PM      Component Value Range Status Comment   Specimen Description TRACHEAL ASPIRATE   Final    Special Requests NONE   Final    Gram Stain     Final    Value: NO WBC SEEN     RARE SQUAMOUS EPITHELIAL CELLS PRESENT     RARE GRAM POSITIVE COCCI     IN PAIRS   Culture Non-Pathogenic Oropharyngeal-type Flora Isolated.   Final    Report Status 04/08/2012 FINAL   Final   URINE CULTURE     Status: Normal   Collection Time   04/05/12  2:31 PM      Component Value Range Status Comment   Specimen Description URINE, RANDOM   Final    Special Requests Normal   Final    Culture  Setup Time 04/05/2012 22:00   Final    Colony Count NO GROWTH   Final    Culture NO GROWTH   Final    Report Status 04/06/2012 FINAL   Final      Studies: No results found.  Scheduled Meds:    . alteplase  2 mg Intracatheter Once  . antiseptic oral rinse  1 application Mouth Rinse QID  . ciprofloxacin  2 drop Both Eyes Q4H while awake  . famotidine  20 mg Oral BID  . heparin subcutaneous  5,000 Units Subcutaneous Q8H  . hydrALAZINE  25 mg Per Tube Q8H  . insulin aspart  0-15 Units Subcutaneous TID WC  . insulin aspart  0-5 Units Subcutaneous QHS  . insulin glargine  20 Units Subcutaneous Daily  . LORazepam  2 mg Per Tube BID  . methadone  10 mg Per Tube Q12H  . pneumococcal 23 valent vaccine  0.5 mL Intramuscular Tomorrow-1000  .  potassium chloride  40 mEq Oral Once  . potassium chloride  40  mEq Oral Once  . sodium chloride  10-40 mL Intracatheter Q12H  . DISCONTD: chlorhexidine  15 mL Mouth/Throat BID  . DISCONTD: LORazepam  2 mg Per Tube Q8H  . DISCONTD: piperacillin-tazobactam (ZOSYN)  IV  3.375 g Intravenous Q8H  . DISCONTD: vancomycin  1,000 mg Intravenous Q12H   Continuous Infusions:   Principal Problem:  *Septic shock(785.52) Active Problems:  Pneumococcal pneumonia  Acute respiratory failure  Diabetes  Acute encephalopathy  Acute renal failure    Time spent: 30 minutes    Amor Packard  [facility] Pager [phone]. If 8PM-8AM, please contact night-coverage at [email or web address], password [facility] 04/13/2012, 3:10 PM  LOS: 19 days

## 2012-04-14 ENCOUNTER — Encounter (HOSPITAL_COMMUNITY): Payer: Self-pay | Admitting: Internal Medicine

## 2012-04-14 LAB — BASIC METABOLIC PANEL
CO2: 21 mEq/L (ref 19–32)
Chloride: 104 mEq/L (ref 96–112)
Creatinine, Ser: 1.48 mg/dL — ABNORMAL HIGH (ref 0.50–1.35)
GFR calc Af Amer: 61 mL/min — ABNORMAL LOW (ref >90)
Potassium: 3.2 mEq/L — ABNORMAL LOW (ref 3.5–5.1)

## 2012-04-14 LAB — CBC
HCT: 24.8 % — ABNORMAL LOW (ref 39.0–52.0)
Hemoglobin: 8.3 g/dL — ABNORMAL LOW (ref 13.0–17.0)
MCV: 87.9 fL (ref 78.0–100.0)
RDW: 13.7 % (ref 11.5–15.5)
WBC: 6.3 10*3/uL (ref 4.0–10.5)

## 2012-04-14 LAB — GLUCOSE, CAPILLARY
Glucose-Capillary: 166 mg/dL — ABNORMAL HIGH (ref 70–99)
Glucose-Capillary: 164 mg/dL — ABNORMAL HIGH (ref 70–99)

## 2012-04-14 LAB — HEMOGLOBIN A1C
Hgb A1c MFr Bld: 7.5 % — ABNORMAL HIGH (ref <5.7)
Mean Plasma Glucose: 169 mg/dL — ABNORMAL HIGH (ref <117)

## 2012-04-14 MED ORDER — PREGABALIN 50 MG PO CAPS
50.0000 mg | ORAL_CAPSULE | Freq: Two times a day (BID) | ORAL | Status: DC
  Administered 2012-04-14 – 2012-04-17 (×7): 50 mg via ORAL
  Filled 2012-04-14 (×7): qty 1

## 2012-04-14 MED ORDER — OXYCODONE HCL 5 MG PO TABS
5.0000 mg | ORAL_TABLET | Freq: Three times a day (TID) | ORAL | Status: DC
  Administered 2012-04-14 – 2012-04-17 (×10): 5 mg via ORAL
  Filled 2012-04-14 (×7): qty 1

## 2012-04-14 MED ORDER — POTASSIUM CHLORIDE CRYS ER 20 MEQ PO TBCR
40.0000 meq | EXTENDED_RELEASE_TABLET | Freq: Three times a day (TID) | ORAL | Status: AC
  Administered 2012-04-14 (×3): 40 meq via ORAL
  Filled 2012-04-14 (×4): qty 2

## 2012-04-14 MED ORDER — NICOTINE 7 MG/24HR TD PT24
7.0000 mg | MEDICATED_PATCH | Freq: Every day | TRANSDERMAL | Status: DC
  Administered 2012-04-14 – 2012-04-17 (×4): 7 mg via TRANSDERMAL
  Filled 2012-04-14 (×4): qty 1

## 2012-04-14 MED ORDER — HALOPERIDOL LACTATE 5 MG/ML IJ SOLN: 1.0000 mg | Freq: Four times a day (QID) | INTRAMUSCULAR | Status: DC | PRN

## 2012-04-14 MED ORDER — VITAMIN B-1 100 MG PO TABS
100.0000 mg | ORAL_TABLET | Freq: Every day | ORAL | Status: DC
  Administered 2012-04-14 – 2012-04-17 (×4): 100 mg via ORAL
  Filled 2012-04-14 (×4): qty 1

## 2012-04-14 MED ORDER — MAGNESIUM SULFATE 40 MG/ML IJ SOLN
2.0000 g | Freq: Once | INTRAMUSCULAR | Status: AC
  Administered 2012-04-14: 2 g via INTRAVENOUS
  Filled 2012-04-14: qty 50

## 2012-04-14 MED ORDER — HALOPERIDOL 1 MG PO TABS
1.0000 mg | ORAL_TABLET | Freq: Four times a day (QID) | ORAL | Status: DC | PRN
  Administered 2012-04-14: 1 mg via ORAL
  Filled 2012-04-14 (×2): qty 1

## 2012-04-14 MED ORDER — LORAZEPAM 2 MG/ML IJ SOLN
1.0000 mg | INTRAMUSCULAR | Status: AC
  Administered 2012-04-15: 1 mg via INTRAVENOUS
  Filled 2012-04-14: qty 1

## 2012-04-14 MED ORDER — FOLIC ACID 1 MG PO TABS
1.0000 mg | ORAL_TABLET | Freq: Every day | ORAL | Status: DC
  Administered 2012-04-14 – 2012-04-17 (×4): 1 mg via ORAL
  Filled 2012-04-14 (×4): qty 1

## 2012-04-14 NOTE — Progress Notes (Signed)
Speech Language Pathology Dysphagia Treatment Patient Details Name: Gregory [name] MRN: [id number] DOB: [birth year] Today's Date: 04/14/2012 Time: 1545-1600 SLP Time Calculation (min): 15 min  Assessment / Plan / Recommendation Clinical Impression  Treatment focused on diet tolerance. Patient able to consume clinician provided po trials without overt indication of aspiration, with min verbal reminders to minimize bite/sip size as a general aspiration precautions. Daughter present and educated as well. Overall patient appears to be tolerating current diet. No further f/u SLP services indicated at this time.     Diet Recommendation  Continue with Current Diet: Regular;Thin liquid    SLP Plan Discharge SLP treatment due to (comment) (overall, patient appears to be tolerating diet)   Pertinent Vitals/Pain n/a   Swallowing Goals  SLP Swallowing Goals Patient will utilize recommended strategies during swallow to increase swallowing safety with: Supervision/safety Swallow Study Goal #2 - Progress: Not met (patient with continued AMS, will continue to require sup. )  General Temperature Spikes Noted: No Respiratory Status: Room air Behavior/Cognition: Alert;Cooperative;Pleasant mood;Confused;Impulsive Oral Cavity - Dentition: Adequate natural dentition Patient Positioning: Upright in chair      Dysphagia Treatment Treatment focused on: Skilled observation of diet tolerance;Patient/family/caregiver education Family/Caregiver Educated: daughter Treatment Methods/Modalities: Skilled observation Patient observed directly with PO's: Yes Type of PO's observed: Regular;Thin liquids Feeding: Able to feed self Liquids provided via: Straw Type of cueing: Verbal Amount of cueing: Minimal   GO   Gregory Lango MA, CCC-SLP [phone]   Gregory [name] 04/14/2012, 4:08 PM

## 2012-04-14 NOTE — Progress Notes (Signed)
[facility] PROGRESS NOTE  Gregory [name] AVW:[id number] DOB: August 18, 1960 DOA: 03/25/2012  Assessment/Plan: 1. Acute respiratory failure due to Strep pneumo bilat pneumonia with bacteremia s/p VDRF and trach - now resolved s/p iv levaquin 1 week. 2. AKI due to septic shock s/p CVVHD - resolved 3. Septic shock due to ALPS - resolved 4. Metabolic encephalopathy vs stroke - suspect ICU induced delirium - persistent - difficult to treat - suspect he will need time to recover  5. Polysubstance abuse prior to admission - D/w daughter 04/14/12. Patient has been a heavy smoker. Patient also drinks alcohol but daughter does not know amount or type (as "He is not doing it in front of me"). Also patient has been on chronic opiates for "a long time". Placed on Nicotine patch, vitamin B1 and Folic acid. 6. DM 2 - A1C 7.5 - c/w lantus and novolog 7. HTN - controlled on Hydralazine. Avoid ACEI since he just came off HD 8. Hypokalemia and hypomagnesemia - replete and follow.  9. Anemia of critical care illness  10. Chronic back pain on high dose opiates for a very long time - confirmed with PCP on 04/14/12 that patient has been on 30 mg of oxycodone 4 times a day. Placed on a smaller dose 5 TID in house and monitor.   Code Status: full Family Communication: daughter at bedside  Disposition Plan: CIR vs SNF    Consultants:  PCCM  CKA  Procedures: 10/8intubation and mechanical ventilation  10/9 placed on ARDS protocol 10/10 HD cath placed, CVVH started 10/10-10/16 10/17 Trach placement  10/24 Decannulated   Antibiotics: Azithromycin 10/8 x 1  Ceftriaxone 10/8 x 1  Zosyn 10/8 >>> 10/11  Vancomycin 10/8 >>> 10/11  Levaquin 10/8  >>> 10/19  Tamiflu 10/9>>10/10  10/19 zosyn (?hcap)>> 10/27 10/19 vanco (?hcap)>>10/27  HPI/Subjective: Alert but confused , insisiting on going home.   Objective: Filed Vitals:   04/13/12 1400 04/13/12 1700 04/13/12 2044 04/14/12 0505  BP: 116/76 136/96  120/87 125/73  Pulse: 90 99 91 99  Temp: 98.7 F (37.1 C) 98.2 F (36.8 C) 97.7 F (36.5 C) 98 F (36.7 C)  TempSrc: Oral Oral Oral Oral  Resp: 18 18 18 18   Height:      Weight:   105.235 kg (232 lb)   SpO2: 98% 98% 96% 94%    Intake/Output Summary (Last 24 hours) at 04/14/12 0811 Last data filed at 04/14/12 0630  Gross per 24 hour  Intake   1320 ml  Output   2601 ml  Net  -1281 ml   Filed Weights   04/11/12 2033 04/12/12 2022 04/13/12 2044  Weight: 107 kg (235 lb 14.3 oz) 106.595 kg (235 lb) 105.235 kg (232 lb)    Exam:   General:  Alert, not oriented x3  Cardiovascular: rrr, no M, R,G   Respiratory: ctab,   Abdomen: soft, nt   Data Reviewed: Basic Metabolic Panel:  Lab 04/14/12 8295 04/13/12 1530 04/12/12 0500 04/11/12 0320 04/10/12 2100 04/10/12 0440 04/09/12 1446  NA 136 -- 140 144 145 152* --  K 3.2* -- 3.1* 3.0* 3.1* 2.7* --  CL 104 -- 108 111 113* 118* --  CO2 21 -- 23 22 22 21  --  GLUCOSE 182* -- 165* 181* 209* 200* --  BUN 15 -- 19 26* 29* 37* --  CREATININE 1.48* -- 1.50* 1.64* 1.71* 1.76* --  CALCIUM 8.9 -- 8.5 8.3* 8.3* 8.2* --  MG -- 1.4* 1.4* -- 1.6 -- 1.3*  PHOS -- --  3.7 -- 3.8 -- 4.0   Liver Function Tests:  Lab 04/12/12 0500 04/09/12 0447  AST 21 22  ALT 32 41  ALKPHOS 61 61  BILITOT 0.3 0.2*  PROT 6.6 6.5  ALBUMIN 2.2* 2.2*   No results found for this basename: LIPASE:5,AMYLASE:5 in the last 168 hours No results found for this basename: AMMONIA:5 in the last 168 hours CBC:  Lab 04/14/12 0535 04/12/12 0500 04/11/12 0320 04/10/12 0440 04/09/12 0447  WBC 6.3 6.6 6.6 7.0 6.7  NEUTROABS -- -- -- -- --  HGB 8.3* 7.8* 7.7* 8.0* 7.9*  HCT 24.8* 24.2* 24.4* 26.0* 26.2*  MCV 87.9 89.3 91.0 93.9 96.7  PLT 189 167 164 178 182   Cardiac Enzymes: No results found for this basename: CKTOTAL:5,CKMB:5,CKMBINDEX:5,TROPONINI:5 in the last 168 hours BNP (last 3 results)  Basename 03/25/12 1648  PROBNP 7610.0*   CBG:  Lab 04/14/12 0731  04/13/12 2040 04/13/12 1618 04/13/12 1150 04/13/12 0744  GLUCAP 166* 165* 142* 191* 117*    Recent Results (from the past 240 hour(s))  CLOSTRIDIUM DIFFICILE BY PCR     Status: Normal   Collection Time   04/04/12 11:17 PM      Component Value Range Status Comment   C difficile by pcr NEGATIVE  NEGATIVE Final   CULTURE, BLOOD (ROUTINE X 2)     Status: Normal   Collection Time   04/05/12 10:44 AM      Component Value Range Status Comment   Specimen Description BLOOD HAND RIGHT   Final    Special Requests BOTTLES DRAWN AEROBIC AND ANAEROBIC 1CC EA   Final    Culture  Setup Time 04/06/2012 11:40   Final    Culture NO GROWTH 5 DAYS   Final    Report Status 04/12/2012 FINAL   Final   CULTURE, BLOOD (ROUTINE X 2)     Status: Normal   Collection Time   04/05/12 10:50 AM      Component Value Range Status Comment   Specimen Description BLOOD HAND LEFT   Final    Special Requests BOTTLES DRAWN AEROBIC ONLY 5CC   Final    Culture  Setup Time 04/05/2012 18:52   Final    Culture NO GROWTH 5 DAYS   Final    Report Status 04/11/2012 FINAL   Final   CULTURE, RESPIRATORY     Status: Normal   Collection Time   04/05/12  2:23 PM      Component Value Range Status Comment   Specimen Description TRACHEAL ASPIRATE   Final    Special Requests NONE   Final    Gram Stain     Final    Value: NO WBC SEEN     RARE SQUAMOUS EPITHELIAL CELLS PRESENT     RARE GRAM POSITIVE COCCI     IN PAIRS   Culture Non-Pathogenic Oropharyngeal-type Flora Isolated.   Final    Report Status 04/08/2012 FINAL   Final   URINE CULTURE     Status: Normal   Collection Time   04/05/12  2:31 PM      Component Value Range Status Comment   Specimen Description URINE, RANDOM   Final    Special Requests Normal   Final    Culture  Setup Time 04/05/2012 22:00   Final    Colony Count NO GROWTH   Final    Culture NO GROWTH   Final    Report Status 04/06/2012 FINAL   Final  Studies: No results found.  Scheduled Meds:     . alteplase  2 mg Intracatheter Once  . antiseptic oral rinse  1 application Mouth Rinse QID  . ciprofloxacin  2 drop Both Eyes Q4H while awake  . famotidine  20 mg Oral BID  . folic acid  1 mg Oral Daily  . heparin subcutaneous  5,000 Units Subcutaneous Q8H  . hydrALAZINE  25 mg Per Tube Q8H  . insulin aspart  0-15 Units Subcutaneous TID WC  . insulin aspart  0-5 Units Subcutaneous QHS  . insulin glargine  20 Units Subcutaneous Daily  . LORazepam  2 mg Per Tube BID  . magnesium sulfate 1 - 4 g bolus IVPB  2 g Intravenous Once  . pneumococcal 23 valent vaccine  0.5 mL Intramuscular Tomorrow-1000  . potassium chloride  40 mEq Oral Once  . potassium chloride  40 mEq Oral Once  . potassium chloride  40 mEq Oral TID  . pregabalin  50 mg Oral BID  . sodium chloride  10-40 mL Intracatheter Q12H  . thiamine  100 mg Oral Daily  . DISCONTD: chlorhexidine  15 mL Mouth/Throat BID  . DISCONTD: LORazepam  2 mg Per Tube Q8H  . DISCONTD: piperacillin-tazobactam (ZOSYN)  IV  3.375 g Intravenous Q8H  . DISCONTD: vancomycin  1,000 mg Intravenous Q12H   Continuous Infusions:   Principal Problem:  *Septic shock(785.52) Active Problems:  Pneumococcal pneumonia  Acute respiratory failure  Diabetes  Acute encephalopathy  Acute renal failure    Time spent: 30 minutes    Gregory [name]  [facility] Pager [phone]. If 8PM-8AM, please contact night-coverage at [email or web address], password [facility] 04/14/2012, 8:11 AM  LOS: 20 days

## 2012-04-14 NOTE — Progress Notes (Signed)
Met with patient and his daughter, Gregory [name] this morning. Explained that patient will need to show ability to participate in an intensive therapy program before we can consider CIR admission. Explained that a SNF would provide rehabilitation and could allow a longer length of stay than CIR. Pt has no caregiver available at home. Will continue to assess daily for progress. Please call for questions: [phone].

## 2012-04-14 NOTE — Progress Notes (Signed)
Spoke with patients Daughter [name] at length today regarding their questions related to the patients plan of care/CIR/Dc disposition. Called and spoke with Jola Babinski with CIR Admissions. Jola Babinski indicated that patient at this time does not qualify for CIR d/t his therapy participation and progress slow to progress. Also stated patient will need assistance at home after d/c and currently that is not available. [name] made aware of the above. Discussed with [name] that the SW will speak with her regarding the process for SNF placement (faxing out, bed offers etc.) either today or tomorrow.  Provided clarity for daughters questions regarding the following concerns: - The frequency of PT working with patient. Concerned that PT has not worked with patient since 04/11/12.  Informed [name] that PT would not routinely see patient every day on the department, however staff have been assisting patient from bed to chair numerous times today and will notify PT as request visit on 04/15/12.   - Patient c/o sacral soreness when sitting. Informed daughter that Milana Na has been ordered and is currently in use, patient also with foam dressing and Barrier Cream to sacrum.   Will continue to monitor. Emorie Mcfate, Charlyne Quale

## 2012-04-14 NOTE — Progress Notes (Signed)
UR completed 

## 2012-04-14 NOTE — Progress Notes (Signed)
Pt  Restless this shift,Constantly  Attempting to get out of bed.  Per conversation with family  Not pleased with his care. Why is he   Not getting  Better?30-45 min  Explaining hospital course to  [facility].

## 2012-04-15 ENCOUNTER — Encounter (HOSPITAL_COMMUNITY): Payer: Self-pay | Admitting: Internal Medicine

## 2012-04-15 ENCOUNTER — Inpatient Hospital Stay (HOSPITAL_COMMUNITY): Payer: 59

## 2012-04-15 DIAGNOSIS — G9340 Encephalopathy, unspecified: Secondary | ICD-10-CM

## 2012-04-15 LAB — GLUCOSE, CAPILLARY
Glucose-Capillary: 185 mg/dL — ABNORMAL HIGH (ref 70–99)
Glucose-Capillary: 149 mg/dL — ABNORMAL HIGH (ref 70–99)
Glucose-Capillary: 127 mg/dL — ABNORMAL HIGH (ref 70–99)
Glucose-Capillary: 145 mg/dL — ABNORMAL HIGH (ref 70–99)

## 2012-04-15 MED ORDER — HYDRALAZINE HCL 25 MG PO TABS
25.0000 mg | ORAL_TABLET | Freq: Three times a day (TID) | ORAL | Status: DC
  Administered 2012-04-15 – 2012-04-17 (×6): 25 mg via ORAL
  Filled 2012-04-15 (×8): qty 1

## 2012-04-15 MED ORDER — LORAZEPAM 1 MG PO TABS
2.0000 mg | ORAL_TABLET | Freq: Three times a day (TID) | ORAL | Status: DC
  Administered 2012-04-15 – 2012-04-17 (×7): 2 mg via ORAL
  Filled 2012-04-15 (×7): qty 2

## 2012-04-15 MED ORDER — ALUM & MAG HYDROXIDE-SIMETH 200-200-20 MG/5ML PO SUSP
30.0000 mL | ORAL | Status: DC | PRN
  Filled 2012-04-15 (×2): qty 30

## 2012-04-15 MED ORDER — INSULIN GLARGINE 100 UNIT/ML ~~LOC~~ SOLN
22.0000 [IU] | Freq: Every day | SUBCUTANEOUS | Status: DC
  Administered 2012-04-16 – 2012-04-17 (×2): 22 [IU] via SUBCUTANEOUS

## 2012-04-15 MED ORDER — GI COCKTAIL ~~LOC~~
30.0000 mL | Freq: Once | ORAL | Status: AC
  Administered 2012-04-15: 30 mL via ORAL
  Filled 2012-04-15: qty 30

## 2012-04-15 NOTE — Progress Notes (Signed)
Occupational Therapy Treatment Patient Details Name: Gregory [name] MRN: [id number] DOB: 20-Feb-1961 Today's Date: 04/15/2012 Time: 4098-1191 OT Time Calculation (min): 24 min  OT Assessment / Plan / Recommendation Comments on Treatment Session Pt with poor awareness into cognitive deficits. Pt will need SNF for rehab prior to returning home due to significant cognitive deficits. Daughters upset about their father's status. Attempted to explain SNF process.     Follow Up Recommendations  Skilled nursing facility    Barriers to Discharge   decreased caregiver support    Equipment Recommendations  None recommended by OT    Recommendations for Other Services  none  Frequency Min 2X/week   Plan Discharge plan remains appropriate    Precautions / Restrictions Precautions Precautions: Fall Precaution Comments: cognitive dificits Restrictions Weight Bearing Restrictions: No   Pertinent Vitals/Pain none    ADL  Lower Body Dressing: Minimal assistance Where Assessed - Lower Body Dressing: Supported sit to stand Equipment Used: Gait belt;Rolling walker Transfers/Ambulation Related to ADLs: Minguard. ambulated with minguard during functional activity. Pt given 1 step command. Required vc to recall task. Unable to recall room number after 30 sec delay. ADL Comments: Supervision for cognitive aspects of tasks    OT Diagnosis:    OT Problem List:   OT Treatment Interventions:     OT Goals Acute Rehab OT Goals OT Goal Formulation: With family Time For Goal Achievement: 04/21/12 Potential to Achieve Goals: Good ADL Goals Pt Will Perform Grooming: with min assist;Sitting, chair ADL Goal: Grooming - Progress: Met Pt Will Perform Upper Body Bathing: with mod assist;Sitting, chair ADL Goal: Upper Body Bathing - Progress: Met Pt Will Perform Lower Body Bathing: with mod assist;Sit to stand from chair ADL Goal: Lower Body Bathing - Progress: Met Pt Will Transfer to Toilet: with mod  assist;3-in-1 ADL Goal: Toilet Transfer - Progress: Met Additional ADL Goal #1: Pt. will participate in 15 mins therapeutic activity with 3 rest breaks ADL Goal: Additional Goal #1 - Progress: Progressing toward goals Additional ADL Goal #2: Pt. will maintain sustained attention x 3 mins during familiar ADL activity ADL Goal: Additional Goal #2 - Progress: Progressing toward goals  Visit Information  Last OT Received On: 04/15/12 Assistance Needed: +1    Subjective Data   They can't keep me here!   Prior Functioning  Home Living Available Help at Discharge:  (Limited if any help available at Discharge) Communication Communication: Other (comment) (Pt communicates clearly, however reliable info questioned)    Cognition  Area of Impairment: Memory;Following commands;Safety/judgement;Awareness of errors;Awareness of deficits;Attention;Problem solving;Executive functioning Orientation Level: Appears intact for tasks assessed;Disoriented to;Time Behavior During Session: Agitated Current Attention Level: Sustained Attention - Other Comments: internally distracted Memory: Decreased recall of precautions Memory Deficits: unable to recall room number after 30 sec delay Following Commands: Follows multi-step commands inconsistently () Safety/Judgement: Decreased safety judgement for tasks assessed;Decreased awareness of safety precautions;Decreased awareness of need for assistance Safety/Judgement - Other Comments: Trying to get OOb independently Awareness of Errors: Assistance required to identify errors made;Assistance required to correct errors made Awareness of Errors - Other Comments: emergent awareness Awareness of Deficits: poor insight into deficits Problem Solving: Mod A for problem solving Executive Functioning: Poor ability to self correct. poor attention Cognition - Other Comments: Pt with poor insight into deficits. Unsafe for D/C home alone.    Mobility  Shoulder  Instructions Bed Mobility Bed Mobility: Not assessed Supine to Sit: Not tested (comment) Sitting - Scoot to Edge of Bed: Not tested (comment) Sit  to Supine: Not Tested (comment) Scooting to [facility]: Not tested (comment) Transfers Transfers: Sit to Stand;Stand to Sit Sit to Stand: 4: Min assist (hand held steady assist with verbal cueing) Sit to Stand: Patient Percentage: 70% Stand to Sit: 4: Min assist (hand held assist and verbal cueing) Stand to Sit: Patient Percentage: 70% Details for Transfer Assistance: vc for hand placement       Exercises      Balance  Min A. Occ sway noted.   End of Session OT - End of Session Equipment Utilized During Treatment: Gait belt Activity Tolerance: Patient tolerated treatment well Patient left: in bed;with call bell/phone within reach;with family/visitor present Nurse Communication: Mobility status  GO     Abdulloh Ullom,HILLARY 04/15/2012, 4:43 PM [facility], OTR/L  [phone] 04/15/2012

## 2012-04-15 NOTE — Progress Notes (Signed)
Nutrition Follow-up  Intervention:    Recommend Carbohydrate Modified High diet to better meet calorie and protein needs. RD to continue to follow.  Assessment:   Intake of meals is adequate, mostly >75%. Pt confirms that his appetite is great at this time and he denies any questions/concerns regarding his nutrition.  Diet: Carbohydrate Modified Medium (1600 - 2000) with 2 gram Sodium Restriction  Meds: Scheduled Meds:    . ciprofloxacin  2 drop Both Eyes Q4H while awake  . famotidine  20 mg Oral BID  . folic acid  1 mg Oral Daily  . heparin subcutaneous  5,000 Units Subcutaneous Q8H  . hydrALAZINE  25 mg Per Tube Q8H  . insulin aspart  0-15 Units Subcutaneous TID WC  . insulin aspart  0-5 Units Subcutaneous QHS  . insulin glargine  20 Units Subcutaneous Daily  . LORazepam  1 mg Intravenous On Call  . LORazepam  2 mg Per Tube BID  . nicotine  7 mg Transdermal Daily  . oxyCODONE  5 mg Oral TID  . pneumococcal 23 valent vaccine  0.5 mL Intramuscular Tomorrow-1000  . potassium chloride  40 mEq Oral TID  . pregabalin  50 mg Oral BID  . sodium chloride  10-40 mL Intracatheter Q12H  . thiamine  100 mg Oral Daily  . DISCONTD: antiseptic oral rinse  1 application Mouth Rinse QID   Continuous Infusions:   PRN Meds:.acetaminophen (TYLENOL) oral liquid 160 mg/5 mL, albuterol, docusate sodium, haloperidol, haloperidol lactate, labetalol, oxyCODONE, polyethylene glycol, sodium chloride  Labs:  CMP     Component Value Date/Time   NA 136 04/14/2012 0535   K 3.2* 04/14/2012 0535   CL 104 04/14/2012 0535   CO2 21 04/14/2012 0535   GLUCOSE 182* 04/14/2012 0535   BUN 15 04/14/2012 0535   CREATININE 1.48* 04/14/2012 0535   CALCIUM 8.9 04/14/2012 0535   PROT 6.6 04/12/2012 0500   ALBUMIN 2.2* 04/12/2012 0500   AST 21 04/12/2012 0500   ALT 32 04/12/2012 0500   ALKPHOS 61 04/12/2012 0500   BILITOT 0.3 04/12/2012 0500   GFRNONAA 53* 04/14/2012 0535   GFRAA 61* 04/14/2012 0535   CBG  (last 3)   Basename 04/15/12 0737 04/14/12 2139 04/14/12 1655  GLUCAP 185* 139* 146*    Sodium  Date/Time Value Range Status  04/14/2012  5:35 AM 136  135 - 145 mEq/L Final  04/12/2012  5:00 AM 140  135 - 145 mEq/L Final  04/11/2012  3:20 AM 144  135 - 145 mEq/L Final    Potassium  Date/Time Value Range Status  04/14/2012  5:35 AM 3.2* 3.5 - 5.1 mEq/L Final  04/12/2012  5:00 AM 3.1* 3.5 - 5.1 mEq/L Final  04/11/2012  3:20 AM 3.0* 3.5 - 5.1 mEq/L Final    Phosphorus  Date/Time Value Range Status  04/12/2012  5:00 AM 3.7  2.3 - 4.6 mg/dL Final  82/95/6213  0:86 PM 3.8  2.3 - 4.6 mg/dL Final  57/84/6962  9:52 PM 4.0  2.3 - 4.6 mg/dL Final    Magnesium  Date/Time Value Range Status  04/13/2012  3:30 PM 1.4* 1.5 - 2.5 mg/dL Final  84/13/2440  1:02 AM 1.4* 1.5 - 2.5 mg/dL Final  72/53/6644  0:34 PM 1.6  1.5 - 2.5 mg/dL Final     Intake/Output Summary (Last 24 hours) at 04/15/12 1038 Last data filed at 04/15/12 0900  Gross per 24 hour  Intake    762 ml  Output   2850  ml  Net  -2088 ml  BM: 10/25  Weight Status:  106.1 kg weight between 104.6 - 107 kg x 1 week  Body mass index is 30.86 kg/(m^2). Obese Class I  Estimated needs:  2000-2200 kcals, 120-140 gm protein, 2-2.1 liters fluid daily  Nutrition Dx:  Inadequate oral intake related to inability to eat as evidenced by NPO status, resolved.  New Goal:  Intake to meet >90% of estimated nutrition needs.  Monitor:  PO intake, labs, weight trends  Jarold Motto MS, RD, LDN Pager: [phone] After-hours pager: [phone]

## 2012-04-15 NOTE — Progress Notes (Signed)
Talked with patient's daughter, Gregory [name] this AM at length re: d/c disposition. Pt is not appropriate for CIR due to confusion and inability for carry-over. Also family has no discharge plan, no one available to provide care.  Explained to Gregory [name] that SNF is recommended due to pt needs long term  rehabilitation and care. Provided emotional support and encouraged [name] to consult with the social worker for her questions re: managing her father's affairs while he is hospitalized. Please call for questions: [phone].

## 2012-04-15 NOTE — Progress Notes (Signed)
[facility] PROGRESS NOTE  Gregory [name] HQI:[id number] DOB: [birth year] DOA: 03/25/2012  Assessment/Plan: 1. Acute respiratory failure due to Strep pneumo bilat pneumonia with bacteremia s/p VDRF and trach - now resolved s/p iv levaquin for 1 week. 2. AKI due to septic shock s/p CVVHD - resolved 3. Septic shock due to ALPS - resolved 4. Metabolic encephalopathy - suspect ICU induced delirium - persistent - difficult to treat - suspect he will need time to recover . MRI brain negative for stroke.  5. Polysubstance abuse prior to admission - D/w daughter 04/14/12. Patient has been a heavy smoker. Patient also drinks alcohol but daughter does not know amount or type (as "He is not doing it in front of me"). Also patient has been on chronic opiates for "a long time". Placed on Nicotine patch, vitamin B1 and Folic acid and ativan . 6. DM 2 - A1C 7.5 - c/w lantus and novolog 7. HTN - controlled on Hydralazine. Avoid ACEI since he just came off HD 8. Hypokalemia and hypomagnesemia - replete and follow.  9. Anemia of critical care illness  10. Chronic back pain on high dose opiates for a very long time - confirmed with PCP on 04/14/12 that patient has been on 30 mg of oxycodone 4 times a day and lyrica 200 mg BID. Placed on a smaller dose oxycodone  5 TID and lyrica 50 BID in house and monitor.   Code Status: full Family Communication: daughter at bedside  Disposition Plan:  SNF    Consultants:  PCCM  [facility]  Procedures: 10/8intubation and mechanical ventilation  10/9 placed on ARDS protocol 10/10 HD cath placed, CVVH started 10/10-10/16 10/17 Trach placement  10/24 Decannulated   Antibiotics: Azithromycin 10/8 x 1  Ceftriaxone 10/8 x 1  Zosyn 10/8 >>> 10/11  Vancomycin 10/8 >>> 10/11  Levaquin 10/8  >>> 10/19  Tamiflu 10/9>>10/10  10/19 zosyn (?hcap)>> 10/27 10/19 vanco (?hcap)>>10/27  HPI/Subjective: Less combative . Not nearly as argumentative. Some recollection of discussion  from the day before. Still with significant problems with judgement and short term memory.   Objective: Filed Vitals:   04/14/12 2132 04/15/12 0509 04/15/12 1029 04/15/12 1327  BP: 113/84 114/69 113/80 116/95  Pulse: 94 90 104 83  Temp:  98.5 F (36.9 C) 97.4 F (36.3 C) 98 F (36.7 C)  TempSrc: Oral Oral    Resp: 20 20 19 17   Height:      Weight: 106.1 kg (233 lb 14.5 oz)     SpO2: 97% 95% 90% 98%    Intake/Output Summary (Last 24 hours) at 04/15/12 1517 Last data filed at 04/15/12 1328  Gross per 24 hour  Intake    780 ml  Output   3450 ml  Net  -2670 ml   Filed Weights   04/12/12 2022 04/13/12 2044 04/14/12 2132  Weight: 106.595 kg (235 lb) 105.235 kg (232 lb) 106.1 kg (233 lb 14.5 oz)    Exam:   General:  Alert, not oriented x2. Knows he is in the hospital. Has also some vague idea of the hospitalization course  Cardiovascular: rrr, no M, R,G   Respiratory: ctab,   Abdomen: soft, nt   Data Reviewed: Basic Metabolic Panel:  Lab 04/14/12 1324 04/13/12 1530 04/12/12 0500 04/11/12 0320 04/10/12 2100 04/10/12 0440 04/09/12 1446  NA 136 -- 140 144 145 152* --  K 3.2* -- 3.1* 3.0* 3.1* 2.7* --  CL 104 -- 108 111 113* 118* --  CO2 21 -- 23 22  22 21 --  GLUCOSE 182* -- 165* 181* 209* 200* --  BUN 15 -- 19 26* 29* 37* --  CREATININE 1.48* -- 1.50* 1.64* 1.71* 1.76* --  CALCIUM 8.9 -- 8.5 8.3* 8.3* 8.2* --  MG -- 1.4* 1.4* -- 1.6 -- 1.3*  PHOS -- -- 3.7 -- 3.8 -- 4.0   Liver Function Tests:  Lab 04/12/12 0500 04/09/12 0447  AST 21 22  ALT 32 41  ALKPHOS 61 61  BILITOT 0.3 0.2*  PROT 6.6 6.5  ALBUMIN 2.2* 2.2*   No results found for this basename: LIPASE:5,AMYLASE:5 in the last 168 hours No results found for this basename: AMMONIA:5 in the last 168 hours CBC:  Lab 04/14/12 0535 04/12/12 0500 04/11/12 0320 04/10/12 0440 04/09/12 0447  WBC 6.3 6.6 6.6 7.0 6.7  NEUTROABS -- -- -- -- --  HGB 8.3* 7.8* 7.7* 8.0* 7.9*  HCT 24.8* 24.2* 24.4* 26.0* 26.2*    MCV 87.9 89.3 91.0 93.9 96.7  PLT 189 167 164 178 182   Cardiac Enzymes: No results found for this basename: CKTOTAL:5,CKMB:5,CKMBINDEX:5,TROPONINI:5 in the last 168 hours BNP (last 3 results)  Basename 03/25/12 1648  PROBNP 7610.0*   CBG:  Lab 04/15/12 1158 04/15/12 0737 04/14/12 2139 04/14/12 1655 04/14/12 1155  GLUCAP 149* 185* 139* 146* 164*    No results found for this or any previous visit (from the past 240 hour(s)).   Studies: Mr Brain Wo Contrast  04/15/2012  *RADIOLOGY REPORT*  Clinical Data: Diabetic hypertensive patient with altered mental status.  MRI HEAD WITHOUT CONTRAST  Technique:  Multiplanar, multiecho pulse sequences of the brain and surrounding structures were obtained according to standard protocol without intravenous contrast.  Comparison: 04/08/2012 CT.  No comparison MR.  Findings: The examination is limited to three sequences.  The patient refused to continue the exam.  No acute infarct.  No obvious intracranial hemorrhage or mass.  Mild global atrophy without hydrocephalus.  Altered signal intensity bone marrow.  This may be related to patient's underlying anemia.  Major intracranial vascular structures are patent with branching or fenestrated right vertebral artery.  Mild exophthalmos.  Opacification mastoid air cells and middle ear cavities bilaterally.  IMPRESSION: The examination is limited to three sequences.  The patient refused to continue the exam.  No acute infarct.  No obvious intracranial hemorrhage or mass.  Mild global atrophy without hydrocephalus.  Altered signal intensity bone marrow.  This may be related to patient's underlying anemia.  Opacification mastoid air cells and middle ear cavities bilaterally.   Original Report Authenticated By: Fuller Canada, M.D.     Scheduled Meds:    . ciprofloxacin  2 drop Both Eyes Q4H while awake  . famotidine  20 mg Oral BID  . folic acid  1 mg Oral Daily  . heparin subcutaneous  5,000 Units Subcutaneous  Q8H  . hydrALAZINE  25 mg Per Tube Q8H  . insulin aspart  0-15 Units Subcutaneous TID WC  . insulin aspart  0-5 Units Subcutaneous QHS  . insulin glargine  20 Units Subcutaneous Daily  . LORazepam  1 mg Intravenous On Call  . LORazepam  2 mg Per Tube BID  . nicotine  7 mg Transdermal Daily  . oxyCODONE  5 mg Oral TID  . potassium chloride  40 mEq Oral TID  . pregabalin  50 mg Oral BID  . sodium chloride  10-40 mL Intracatheter Q12H  . thiamine  100 mg Oral Daily  . DISCONTD: antiseptic oral rinse  1 application Mouth Rinse QID   Continuous Infusions:   Principal Problem:  *Septic shock(785.52) Active Problems:  Pneumococcal pneumonia  Acute respiratory failure  Diabetes  Acute encephalopathy  Acute renal failure    Time spent: 30 minutes    Sharna Gabrys  [facility] Pager [phone]. If 8PM-8AM, please contact night-coverage at [email or web address], password [facility] 04/15/2012, 3:17 PM  LOS: 21 days

## 2012-04-15 NOTE — Clinical Social Work Note (Signed)
Patient does not have any SNF  bed offers at the moment. CSW will continue to follow and update patient when bed offers are made.   Verlon Au Gertrue Willette  ([facility]) [phone]

## 2012-04-15 NOTE — Progress Notes (Signed)
Pt was given IV Ativan prior to MRI, but pt still became agitated down in MRI, and they were unable to complete the scan. MD notified.

## 2012-04-15 NOTE — Progress Notes (Signed)
Agree with Marjean Donna SPT [place], [place] [phone]

## 2012-04-15 NOTE — Progress Notes (Signed)
Physical Therapy Treatment Patient Details Name: Gregory [name] MRN: [id number] DOB: [birth year] Today's Date: 04/15/2012 Time: 0865-7846 PT Time Calculation (min): 30 min  PT Assessment / Plan / Recommendation Comments on Treatment Session  Pt is making physical progress as demonstrated by increased ambulation distance and increased activity tolerance; however continues to have significant safety deficits with poor judgement, difficulty following multi-step commands, and increased falls risk.  Continued PT in SNF setting post acutely is recommended as he is unsafe to d/c home alone.    Follow Up Recommendations  Post acute inpatient     Does the patient have the potential to tolerate intense rehabilitation  No, recommend SNF  Barriers to Discharge Decreased caregiver support Will be home alone many hours at a time    Equipment Recommendations  None recommended by PT    Recommendations for Other Services    Frequency Min 3X/week   Plan      Precautions / Restrictions Precautions Precautions: Fall Precaution Comments: cognitive dificits Restrictions Weight Bearing Restrictions: No   Pertinent Vitals/Pain Pt reports chronic back pain that was decreased when using rolling walker during ambulation.  no apparent distress    Mobility  Bed Mobility Bed Mobility: Not assessed Supine to Sit: Not tested (comment) Sitting - Scoot to Edge of Bed: Not tested (comment) Sit to Supine: Not Tested (comment) Scooting to [facility]: Not tested (comment) Transfers Transfers: Sit to Stand;Stand to Sit Sit to Stand: 4: Min assist (hand held steady assist with verbal cueing) Sit to Stand: Patient Percentage: 70% Stand to Sit: 4: Min assist (hand held assist and verbal cueing) Stand to Sit: Patient Percentage: 70% Details for Transfer Assistance: vc for hand placement Ambulation/Gait Ambulation/Gait Assistance: 4: Min assist Ambulation Distance (Feet): 100 Feet (21ft x2 (seated rest break  between)) Assistive device: Rolling walker; 1 person hand held assist to/from bathroom Gait Pattern: Step-through pattern;Decreased step length - left;Decreased step length - right;Trunk flexed;Wide base of support;  Pt with significant loss of balance when turning from bathroom to sink, with 1 person hand held assist; required at least moderate assistance to regain balance Stairs: No Wheelchair Mobility Wheelchair Mobility: No    Exercises     PT Diagnosis: Difficulty walking;Generalized weakness;Altered mental status  PT Problem List: Decreased strength;Decreased activity tolerance;Decreased balance;Decreased mobility;Decreased cognition;Decreased safety awareness;Pain PT Treatment Interventions: DME instruction;Gait training;Stair training;Functional mobility training;Therapeutic activities;Therapeutic exercise;Balance training;Patient/family education   PT Goals Acute Rehab PT Goals Time For Goal Achievement: 04/21/12 Potential to Achieve Goals: Good Pt will go Sit to Stand: with min assist PT Goal: Sit to Stand - Progress: Progressing toward goal Pt will go Stand to Sit: with min assist PT Goal: Stand to Sit - Progress: Progressing toward goal Pt will Ambulate: 16 - 50 feet;with min assist;with rolling walker PT Goal: Ambulate - Progress: Progressing toward goal  Visit Information  Last PT Received On: 04/15/12 Assistance Needed: +1    Subjective Data  Subjective: "I don't want to walk, I'm waiting for my dinner, I'm getting ready and going home today."  Patient Stated Goal: To go home   Cognition  Area of Impairment: Memory;Following commands;Safety/judgement;Awareness of errors;Awareness of deficits;Attention;Problem solving;Executive functioning Orientation Level: Appears intact for tasks assessed;Disoriented to;Time Behavior During Session: Agitated Current Attention Level: Sustained Attention - Other Comments: internally distracted Memory: Decreased recall of  precautions Memory Deficits: unable to recall room number after 30 sec delay Following Commands: Follows multi-step commands inconsistently (Pt instructed to walk to door, turn right, and stop at the nurses  station; instructions stated several times prior to activity, and patient failed to stop at the nurses station) Safety/Judgement: Decreased safety judgement for tasks assessed;Decreased awareness of safety precautions;Decreased awareness of need for assistance Safety/Judgement - Other Comments: Trying to get OOb independently Awareness of Errors: Assistance required to identify errors made;Assistance required to correct errors made Awareness of Errors - Other Comments: emergent awareness Awareness of Deficits: poor insight into deficits Problem Solving: Mod A for problem solving Executive Functioning: Poor ability to self correct. poor attention Cognition - Other Comments: Pt with poor insight into deficits. Unsafe for D/C home alone.    Balance  Static Sitting Balance Static Sitting - Balance Support: No upper extremity supported;Feet supported Static Sitting - Level of Assistance: 7: Independent Static Sitting - Comment/# of Minutes: pt sitting in chair at begining and end of PT session Static Standing Balance Static Standing - Balance Support: Bilateral upper extremity supported (Bilat. UE support with toileting, washing hands, and walking)  End of Session PT - End of Session Equipment Utilized During Treatment: Gait belt Activity Tolerance: Patient tolerated treatment well;Patient limited by pain;Patient limited by fatigue Patient left: in chair;with family/visitor present;with call bell/phone within reach Nurse Communication: Mobility status (pt comments and eagerness to go home) Discussed with nurse about possible increased agitation with use of chair alarm, therefore chair alarm was not set; pt is in video monitored room.    GP     Sharion Balloon 04/15/2012, 4:57 PM Sharion Balloon, SPT Acute Rehab Services [phone]

## 2012-04-15 NOTE — Progress Notes (Signed)
NEURO HOSPITALIST PROGRESS NOTE   SUBJECTIVE:                                                                                                                        Patient is alert and oriented to hospital, [place], year 2013 but does not know the month.  He has no complaints.   OBJECTIVE:                                                                                                                           Vital signs in last 24 hours: Temp:  [97.4 F (36.3 C)-99.8 F (37.7 C)] 97.4 F (36.3 C) (10/29 1029) Pulse Rate:  [90-104] 104  (10/29 1029) Resp:  [19-20] 19  (10/29 1029) BP: (113-136)/(69-95) 113/80 mmHg (10/29 1029) SpO2:  [90 %-100 %] 90 % (10/29 1029) Weight:  [106.1 kg (233 lb 14.5 oz)] 106.1 kg (233 lb 14.5 oz) (10/28 2132)  Intake/Output from previous day: 10/28 0701 - 10/29 0700 In: 1002 [P.O.:1002] Out: 2700 [Urine:2700] Intake/Output this shift: Total I/O In: 300 [P.O.:300] Out: 1500 [Urine:1500] Nutritional status: Carb Control  Past Medical History  Diagnosis Date  . Diabetes mellitus without complication   . Hypertension      Neurologic Exam:   Mental Status: Alert, oriented (not to month) thought content appropriate.  Speech fluent without evidence of aphasia.  Able to follow 3 step commands without difficulty. Cranial Nerves: II: Discs flat bilaterally; Visual fields grossly normal, pupils equal, round, reactive to light and accommodation III,IV, VI: ptosis not present, extra-ocular motions intact bilaterally V,VII: smile symmetric, facial light touch sensation normal bilaterally VIII: hearing normal bilaterally IX,X: gag reflex present XI: bilateral shoulder shrug XII: midline tongue extension Motor: Moves all extremities antigravity Sensory: Pinprick and light touch intact throughout, bilaterally Deep Tendon Reflexes: 2+ and symmetric throughout Cerebellar: normal finger-to-nose,   CV: pulses palpable  throughout    Lab Results: No results found for this basename: cbc, bmp, coags, chol, tri, ldl, hga1c   Lipid Panel No results found for this basename: CHOL,TRIG,HDL,CHOLHDL,VLDL,LDLCALC in the last 72 hours  Studies/Results: Mr Brain Wo Contrast  04/15/2012  *RADIOLOGY REPORT*  Clinical Data: Diabetic hypertensive patient with altered mental status.  MRI HEAD WITHOUT  CONTRAST  Technique:  Multiplanar, multiecho pulse sequences of the brain and surrounding structures were obtained according to standard protocol without intravenous contrast.  Comparison: 04/08/2012 CT.  No comparison MR.  Findings: The examination is limited to three sequences.  The patient refused to continue the exam.  No acute infarct.  No obvious intracranial hemorrhage or mass.  Mild global atrophy without hydrocephalus.  Altered signal intensity bone marrow.  This may be related to patient's underlying anemia.  Major intracranial vascular structures are patent with branching or fenestrated right vertebral artery.  Mild exophthalmos.  Opacification mastoid air cells and middle ear cavities bilaterally.  IMPRESSION: The examination is limited to three sequences.  The patient refused to continue the exam.  No acute infarct.  No obvious intracranial hemorrhage or mass.  Mild global atrophy without hydrocephalus.  Altered signal intensity bone marrow.  This may be related to patient's underlying anemia.  Opacification mastoid air cells and middle ear cavities bilaterally.   Original Report Authenticated By: Fuller Canada, M.D.    Carotid duplex completed.  Preliminary report: Bilateral: No evidence of hemodynamically significant internal carotid artery stenosis. Vertebral artery flow is antegrade.   MEDICATIONS                                                                                                                        Scheduled:   . ciprofloxacin  2 drop Both Eyes Q4H while awake  . famotidine  20 mg Oral BID  .  folic acid  1 mg Oral Daily  . heparin subcutaneous  5,000 Units Subcutaneous Q8H  . hydrALAZINE  25 mg Per Tube Q8H  . insulin aspart  0-15 Units Subcutaneous TID WC  . insulin aspart  0-5 Units Subcutaneous QHS  . insulin glargine  20 Units Subcutaneous Daily  . LORazepam  1 mg Intravenous On Call  . LORazepam  2 mg Per Tube BID  . nicotine  7 mg Transdermal Daily  . oxyCODONE  5 mg Oral TID  . pneumococcal 23 valent vaccine  0.5 mL Intramuscular Tomorrow-1000  . potassium chloride  40 mEq Oral TID  . pregabalin  50 mg Oral BID  . sodium chloride  10-40 mL Intracatheter Q12H  . thiamine  100 mg Oral Daily  . DISCONTD: antiseptic oral rinse  1 application Mouth Rinse QID    ASSESSMENT/PLAN:  Patient Active Hospital Problem List:  Acute encephalopathy (03/25/2012)   Assessment: Patient continues to improve slowly. MRI was limited due to patient refusing further imaging but shows no acute infarct.    Plan: Continue medical treatment.  Once able, start ASA 81 mg daily.  No further neurology recommendations. Neurology will S/O.      Felicie Morn [name] [facility] [phone]  04/15/2012, 12:32 PM

## 2012-04-15 NOTE — Progress Notes (Signed)
Pt off unit

## 2012-04-16 LAB — GLUCOSE, CAPILLARY: Glucose-Capillary: 171 mg/dL — ABNORMAL HIGH (ref 70–99)

## 2012-04-16 LAB — BASIC METABOLIC PANEL
Calcium: 9.5 mg/dL (ref 8.4–10.5)
GFR calc Af Amer: 59 mL/min — ABNORMAL LOW (ref >90)
GFR calc non Af Amer: 51 mL/min — ABNORMAL LOW (ref >90)
Sodium: 136 mEq/L (ref 135–145)

## 2012-04-16 LAB — MAGNESIUM: Magnesium: 1.6 mg/dL (ref 1.5–2.5)

## 2012-04-16 NOTE — Progress Notes (Signed)
Physical Therapy Treatment Patient Details Name: Gregory [name] MRN: [id number] DOB: [birth year] Today's Date: 04/16/2012 Time: 7829-5621 PT Time Calculation (min): 27 min  PT Assessment / Plan / Recommendation Comments on Treatment Session  Admitted with pna, sepsis, VDRF, encephalopathy;   Making progress with physical activity, with increasing amb today, and improving ability to pathfind back to room, though stil requiring cues;   Continued decr safety, making dc home alone an unsafe situation;   Appreciate consult for speech language cognition    Follow Up Recommendations  Post acute inpatient     Does the patient have the potential to tolerate intense rehabilitation  No, Recommend SNF  Barriers to Discharge        Equipment Recommendations  None recommended by PT    Recommendations for Other Services    Frequency Min 3X/week   Plan Discharge plan remains appropriate;Frequency remains appropriate    Precautions / Restrictions Precautions Precautions: Fall Precaution Comments: cognitive dificits Restrictions Weight Bearing Restrictions: No   Pertinent Vitals/Pain 7/10 back pain; RN notified    Mobility  Bed Mobility Bed Mobility: Supine to Sit Supine to Sit: 4: Min assist Details for Bed Mobility Assistance: cues for sequencing, safe technique, encouragement.   Transfers Transfers: Sit to Stand;Stand to Sit Sit to Stand: 4: Min guard;With upper extremity assist;From bed Stand to Sit: 4: Min assist Details for Transfer Assistance: Cues for safety, hand placement, tending to pull up on RW Ambulation/Gait Ambulation/Gait Assistance: 4: Min guard Ambulation Distance (Feet): 140 Feet (with seated rest break) Assistive device: Rolling walker Ambulation/Gait Assistance Details: Walks with trunk flexed posture, attributes this to back pain and will likely not correct posture; able to follow multi-step command (go to nurse's station, take a left, and go down hallway with  windows) Gait Pattern: Step-through pattern;Decreased step length - left;Decreased step length - right;Trunk flexed;Wide base of support    Exercises     PT Diagnosis:    PT Problem List:   PT Treatment Interventions:     PT Goals Acute Rehab PT Goals Time For Goal Achievement: 04/21/12 Potential to Achieve Goals: Good Pt will go Supine/Side to Sit: with modified independence PT Goal: Supine/Side to Sit - Progress: Progressing toward goal Pt will go Sit to Stand: with supervision PT Goal: Sit to Stand - Progress: Updated due to goal met Pt will go Stand to Sit: with supervision PT Goal: Stand to Sit - Progress: Updated due to goals met Pt will Ambulate: with rolling walker;>150 feet;with supervision PT Goal: Ambulate - Progress: Updated due to goal met  Visit Information  Last PT Received On: 04/16/12 Assistance Needed: +1    Subjective Data  Subjective: Not wanting to walk, citing visitors are in room; One visitor is his daughter, Sandrea Hammond, who encouraged him to walk with PT Patient Stated Goal: To go home   Cognition  Overall Cognitive Status: Impaired Area of Impairment: Memory;Following commands;Safety/judgement;Awareness of errors;Awareness of deficits;Attention;Problem solving;Executive functioning Arousal/Alertness: Awake/alert Orientation Level: Appears intact for tasks assessed;Disoriented to;Time Behavior During Session: WFL for tasks performed (seems to be on the cusp of becoming agitated) Current Attention Level: Sustained Attention - Other Comments: internally distracted Memory: Decreased recall of precautions Memory Deficits: Still with difficulty recalling room number; eventually saw it on whiteboard and answered correctly; did not recal room number during amb correctly (stated 3067 -- correct room number is 6703) Following Commands: Follows multi-step commands inconsistently Safety/Judgement: Decreased safety judgement for tasks assessed;Decreased awareness of  safety precautions;Decreased awareness of need for  assistance Safety/Judgement - Other Comments: Unaware of scope of ADLs and home management tasks he would have to do if dc'd home alone Awareness of Errors: Assistance required to identify errors made;Assistance required to correct errors made Awareness of Errors - Other Comments: emergent awareness Awareness of Deficits: poor insight into deficits Executive Functioning: Poor ability to self correct. poor attention Cognition - Other Comments: Pt with poor insight into deficits. Unsafe for D/C home alone.    Balance     End of Session PT - End of Session Equipment Utilized During Treatment: Gait belt Activity Tolerance: Patient tolerated treatment well;Patient limited by pain;Patient limited by fatigue Patient left: in chair;with family/visitor present;with call bell/phone within reach;with chair alarm set Nurse Communication: Mobility status   GP     Van Clines [facility] [place], [employer] [phone]   04/16/2012, 4:48 PM

## 2012-04-16 NOTE — Progress Notes (Signed)
[facility] PROGRESS NOTE  Gregory [name] ZOX:[id number] DOB: [birth year] DOA: 03/25/2012  Assessment/Plan: 1. Acute respiratory failure due to Strep pneumo bilat pneumonia with bacteremia s/p VDRF and trach - now resolved s/p iv  abx last -levaquin for 1 week dc'ed on 10/19. 2. AKI/Renal insuff due to septic shock s/p CVVHD - trending up today, continue monitoring of nephrotoxins. 3. Septic shock due to ALPS - resolved 4. Metabolic encephalopathy - suspect ICU induced delirium - persistent - difficult to treat - suspect he will need time to recover . MRI brain negative for stroke. Gradually improving. 5. Polysubstance abuse prior to admission - D/w daughter 04/14/12. Patient has been a heavy smoker. Patient also drinks alcohol but daughter does not know amount or type (as "He is not doing it in front of me"). Also patient has been on chronic opiates for "a long time". Placed on Nicotine patch, vitamin B1 and Folic acid and ativan. continue 6. DM 2 - A1C 7.5 - continue lantus and novolog 7. HTN - controlled on Hydralazine. Avoiding ACEI since he just came off HD 8. Hypokalemia and hypomagnesemia - resolved, follow.  9. Anemia of critical care illness  10. Chronic back pain on high dose opiates for a very long time - confirmed with PCP on 04/14/12 that patient has been on 30 mg of oxycodone 4 times a day and lyrica 200 mg BID. Has been on a smaller dose oxycodone  5 TID and lyrica 50 BID in house, with ok pain control, will continue and monitor.   Code Status: full Family Communication:  Disposition Plan: Awaiting SNF    Consultants:  PCCM  CK[facility]  Procedures: 10/8intubation and mechanical ventilation  10/9 placed on ARDS protocol 10/10 HD cath placed, CVVH started 10/10-10/16 10/17 Trach placement  10/24 Decannulated   Antibiotics: Azithromycin 10/8 x 1  Ceftriaxone 10/8 x 1  Zosyn 10/8 >>> 10/11  Vancomycin 10/8 >>> 10/11  Levaquin 10/8  >>> 10/19  Tamiflu 10/9>>10/10  10/19  zosyn (?hcap)>> 10/27 10/19 vanco (?hcap)>>10/27  HPI/Subjective: Alert and oriented x3, but still with poor insight- states he just wants to get out of here for a couple of days. Denies CP, sob and no n/v Objective: Filed Vitals:   04/15/12 1800 04/15/12 2053 04/16/12 0539 04/16/12 0734  BP: 123/81 130/74 139/90 108/67  Pulse: 86 92 96 96  Temp: 98 F (36.7 C) 98.3 F (36.8 C) 98.7 F (37.1 C) 97.5 F (36.4 C)  TempSrc: Oral Oral Oral Oral  Resp: 18 20 18 20   Height:      Weight:  104.3 kg (229 lb 15 oz)    SpO2: 98% 97% 99% 100%    Intake/Output Summary (Last 24 hours) at 04/16/12 1127 Last data filed at 04/16/12 0800  Gross per 24 hour  Intake    360 ml  Output   2025 ml  Net  -1665 ml   Filed Weights   04/13/12 2044 04/14/12 2132 04/15/12 2053  Weight: 105.235 kg (232 lb) 106.1 kg (233 lb 14.5 oz) 104.3 kg (229 lb 15 oz)    Exam:   General:  Alert, not oriented x3. Knows he is in the hospital.   Cardiovascular: RRR, no M, R,G   Respiratory: ctab,   Abdomen: soft, NT/ND, no organomegaly and no masses palpable.   Data Reviewed: Basic Metabolic Panel:  Lab 04/16/12 8119 04/14/12 0535 04/13/12 1530 04/12/12 0500 04/11/12 0320 04/10/12 2100 04/09/12 1446  NA 136 136 -- 140 144 145 --  K  3.7 3.2* -- 3.1* 3.0* 3.1* --  CL 103 104 -- 108 111 113* --  CO2 21 21 -- 23 22 22  --  GLUCOSE 163* 182* -- 165* 181* 209* --  BUN 14 15 -- 19 26* 29* --  CREATININE 1.53* 1.48* -- 1.50* 1.64* 1.71* --  CALCIUM 9.5 8.9 -- 8.5 8.3* 8.3* --  MG 1.6 -- 1.4* 1.4* -- 1.6 1.3*  PHOS -- -- -- 3.7 -- 3.8 4.0   Liver Function Tests:  Lab 04/12/12 0500  AST 21  ALT 32  ALKPHOS 61  BILITOT 0.3  PROT 6.6  ALBUMIN 2.2*   No results found for this basename: LIPASE:5,AMYLASE:5 in the last 168 hours No results found for this basename: AMMONIA:5 in the last 168 hours CBC:  Lab 04/14/12 0535 04/12/12 0500 04/11/12 0320 04/10/12 0440  WBC 6.3 6.6 6.6 7.0  NEUTROABS -- -- -- --    HGB 8.3* 7.8* 7.7* 8.0*  HCT 24.8* 24.2* 24.4* 26.0*  MCV 87.9 89.3 91.0 93.9  PLT 189 167 164 178   Cardiac Enzymes: No results found for this basename: CKTOTAL:5,CKMB:5,CKMBINDEX:5,TROPONINI:5 in the last 168 hours BNP (last 3 results)  Basename 03/25/12 1648  PROBNP 7610.0*   CBG:  Lab 04/16/12 0809 04/15/12 2144 04/15/12 1642 04/15/12 1158 04/15/12 0737  GLUCAP 147* 145* 127* 149* 185*    No results found for this or any previous visit (from the past 240 hour(s)).   Studies: Mr Brain Wo Contrast  04/15/2012  *RADIOLOGY REPORT*  Clinical Data: Diabetic hypertensive patient with altered mental status.  MRI HEAD WITHOUT CONTRAST  Technique:  Multiplanar, multiecho pulse sequences of the brain and surrounding structures were obtained according to standard protocol without intravenous contrast.  Comparison: 04/08/2012 CT.  No comparison MR.  Findings: The examination is limited to three sequences.  The patient refused to continue the exam.  No acute infarct.  No obvious intracranial hemorrhage or mass.  Mild global atrophy without hydrocephalus.  Altered signal intensity bone marrow.  This may be related to patient's underlying anemia.  Major intracranial vascular structures are patent with branching or fenestrated right vertebral artery.  Mild exophthalmos.  Opacification mastoid air cells and middle ear cavities bilaterally.  IMPRESSION: The examination is limited to three sequences.  The patient refused to continue the exam.  No acute infarct.  No obvious intracranial hemorrhage or mass.  Mild global atrophy without hydrocephalus.  Altered signal intensity bone marrow.  This may be related to patient's underlying anemia.  Opacification mastoid air cells and middle ear cavities bilaterally.   Original Report Authenticated By: Fuller Canada, M.D.     Scheduled Meds:    . ciprofloxacin  2 drop Both Eyes Q4H while awake  . famotidine  20 mg Oral BID  . folic acid  1 mg Oral Daily  .  gi cocktail  30 mL Oral Once  . heparin subcutaneous  5,000 Units Subcutaneous Q8H  . hydrALAZINE  25 mg Oral Q8H  . insulin aspart  0-15 Units Subcutaneous TID WC  . insulin aspart  0-5 Units Subcutaneous QHS  . insulin glargine  22 Units Subcutaneous Daily  . LORazepam  2 mg Oral Q8H  . nicotine  7 mg Transdermal Daily  . oxyCODONE  5 mg Oral TID  . pregabalin  50 mg Oral BID  . sodium chloride  10-40 mL Intracatheter Q12H  . thiamine  100 mg Oral Daily  . DISCONTD: hydrALAZINE  25 mg Per Tube Q8H  .  DISCONTD: insulin glargine  20 Units Subcutaneous Daily  . DISCONTD: LORazepam  2 mg Per Tube BID   Continuous Infusions:   Principal Problem:  *Septic shock(785.52) Active Problems:  Pneumococcal pneumonia  Acute respiratory failure  Diabetes  Acute encephalopathy  Acute renal failure    Time spent: 30 minutes    Lark Runk C  [facility] Pager [phone]. If 8PM-8AM, please contact night-coverage at [email or web address], password [facility] 04/16/2012, 11:27 AM  LOS: 22 days

## 2012-04-16 NOTE — Clinical Social Work Note (Signed)
CSW continuing efforts to locate a SNF that will accept patient. Calls made to [facility] and [facility] in [place] and [facility] in [place]. They will review patient's information and reply via Care Finder system. CSW later talked with patient and daughter Aundra Millet. He is adamant on leaving today and does not want to wait for a skilled bed to become available. CSW consulted with attending and psych consult ordered. The attending MD has spoken with the patient and he has agreed to remain in the hospital and be seen by psychiatry on Thursday.  CSW continue to follow and assist as needed and facilitate discharge once SNF is found that will accept patient.  Genelle Bal, MSW, LCSW [phone]

## 2012-04-17 LAB — GLUCOSE, CAPILLARY
Glucose-Capillary: 165 mg/dL — ABNORMAL HIGH (ref 70–99)
Glucose-Capillary: 146 mg/dL — ABNORMAL HIGH (ref 70–99)
Glucose-Capillary: 191 mg/dL — ABNORMAL HIGH (ref 70–99)

## 2012-04-17 LAB — BASIC METABOLIC PANEL
CO2: 20 mEq/L (ref 19–32)
Calcium: 9.7 mg/dL (ref 8.4–10.5)
Chloride: 99 mEq/L (ref 96–112)
Potassium: 4.1 mEq/L (ref 3.5–5.1)
Sodium: 133 mEq/L — ABNORMAL LOW (ref 135–145)

## 2012-04-17 MED ORDER — HYDRALAZINE HCL 25 MG PO TABS: 25.0000 mg | ORAL_TABLET | Freq: Three times a day (TID) | ORAL | Status: DC

## 2012-04-17 MED ORDER — OXYCODONE HCL 5 MG PO TABS: 5.0000 mg | ORAL_TABLET | Freq: Three times a day (TID) | ORAL | Status: DC | PRN

## 2012-04-17 MED ORDER — ISOSORBIDE MONONITRATE 15 MG HALF TABLET: 15.0000 mg | ORAL_TABLET | Freq: Every day | ORAL | Status: DC

## 2012-04-17 MED ORDER — PREGABALIN 50 MG PO CAPS: 50.0000 mg | ORAL_CAPSULE | Freq: Two times a day (BID) | ORAL | Status: DC

## 2012-04-17 NOTE — Progress Notes (Signed)
Clinical Social Work Department CLINICAL SOCIAL WORK PSYCHIATRY SERVICE LINE ASSESSMENT 04/17/2012  Patient:  Gregory [name]  Account:  [id number]  Admit Date:  03/25/2012  Clinical Social Worker:  Ronda Fairly, CLINICAL SOCIAL WORKER  Date/Time:  04/17/2012 12:43 PM Referred by:  Physician  Date referred:   Reason for Referral  Behavioral Health Issues   Presenting Symptoms/Problems (In the person's/family's own words):   Patient reports he is ready to go home, does not wish to go to skilled nursing facility and is confident he will continue to improve without additional care.   Abuse/Neglect/Trauma History (check all that apply)  Denies history   Abuse/Neglect/Trauma Comments:   Psychiatric History (check all that apply)  Denies history   Psychiatric medications:  Current Mental Health Hospitalizations/Previous Mental Health History:   Current provider:   Place and Date:   Current Medications:   Previous Impatient Admission/Date/Reason:   Emotional Health / Current Symptoms    Suicide/Self Harm  None reported   Suicide attempt in the past:   Other harmful behavior:   Psychotic/Dissociative Symptoms  None reported   Other Psychotic/Dissociative Symptoms:    Attention/Behavioral Symptoms  Within Normal Limits   Other Attention / Behavioral Symptoms:    Cognitive Impairment  Within Normal Limits   Other Cognitive Impairment:    Mood and Adjustment  Mood Congruent    Stress, Anxiety, Trauma, Any Recent Loss/Stressor  None reported   Anxiety (frequency):   Phobia (specify):   Compulsive behavior (specify):   Obsessive behavior (specify):   Other:   Substance Abuse/Use  History of substance use   SBIRT completed (please refer for detailed history):  Y  Self-reported substance use:   Patient reports he drinks every four months, consuming [facility] in order to become intoxicated   Urinary Drug Screen Completed:   Alcohol level:     Environmental/Housing/Living Arrangement  With Family Member   Who is in the home:   Daughter, [name] at [phone]   Emergency contact:  Daughter   Financial  Stable Income   Patient's Strengths and Goals (patient's own words):   Patient is grateful for his rapid improvement since admit and has good support system in the home.  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  Patient reports willingness to attend AA meetings.   Clinical Social Worker's Interpretive Summary:   1. Patient is much improved since last week and reports his desire to return to home and return to work.  2. Patient reports he realizes progress may be slow for a while yet is comfortable to go home verses SNF as he feels family will be supportive and helpful.  3. Patient was attentive and respectful to CSW input about possibility he would need more assistance that perhaps a SNF could provide.  4. SBIRT conducted and patient reports alcohol use of [employer] 1 day every 4 months.  Patient realizes that [facility] would be a major complication considering the medications he is currently on. Patient also reports his family and girlfriend will be more than supportive of his not drinking, "they won't stand for that."  6. Patient was given schedules for AA meetings in his surrounding area and 24 hour phone line for AA resources explained.   Disposition:  Psych Clinical Social Worker signing off

## 2012-04-17 NOTE — Evaluation (Signed)
Speech Language Pathology Evaluation Patient Details Name: Jamesmatthew Rappold MRN: [id number] DOB: Sep 01, 1960 Today's Date: 04/17/2012 Time: 1520-1550 SLP Time Calculation (min): 30 min  Problem List:  Patient Active Problem List  Diagnosis  . Pneumococcal pneumonia  . Acute respiratory failure  . Septic shock(785.52)  . Diabetes  . Acute encephalopathy  . Acute renal failure   Past Medical History:  Past Medical History  Diagnosis Date  . Diabetes mellitus without complication   . Hypertension   . DDD (degenerative disc disease), lumbar    Past Surgical History: History reviewed. No pertinent past surgical history. HPI:  51 y.o. male admitted to the hospital on 03/25/12 for respiratory failure and shock. Since admission, blood cultures and respiratory cultures grew positive for strep pneumococcus, patient suffered from STEMI likely secondary to shock,  ATN and ARF (improving), Labile BG,  hypernatremia (from 10/18 to current), hyperchloremia (10/18-current). Over last 24 hours patient has shown a acute change in mental status. Initially believed to be medication, Risperdal, Methadone and Ativan were all decreased.  There was a improvement in his mental status after these medications were decreased. However, CT of brain was obtained and showed questionable left hemispheric infarct with question of artifact; unable to participate in MRI - pt with likely acute encephalopathy.   Intubated 10/8, trached 10/17; decannulated 10/24.       Assessment / Plan / Recommendation Clinical Impression  Pt presents with moderate, lower level cognitive deficits including impulsivity, complete lack of awareens of impairment, poor selective attention, minimal to no storage of new information inpacting short term memory. Pt confabulates when asked orientation questions. 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 He then picked up a dirty spoon and licked it. Pt observed to get up without  help despite frequent warnings. When asked what he was eating he responded "I dunno, its pretty".  The pt would not be capable of caring for himself, is a high fall risk and will need 24 hour supervision. Finding reported to MD. Pt would benefit from f/u SLP at outpatient if SNF/home health is not available.     SLP Assessment  All further Speech Lanaguage Pathology  needs can be addressed in the next venue of care (Pt will d/c today)    Follow Up Recommendations  Skilled Nursing facility    Frequency and Duration        Pertinent Vitals/Pain NA    SLP Goals     SLP Evaluation Prior Functioning  Cognitive/Linguistic Baseline: Baseline deficits Baseline deficit details: substance abuse Type of Home: Mobile home Lives With: Alone Available Help at Discharge: Available PRN/intermittently (None) Vocation: Full time employment   Cognition  Overall Cognitive Status: Impaired Arousal/Alertness: Awake/alert Orientation Level: Oriented to person;Oriented to place;Disoriented to time;Disoriented to situation Attention: Focused;Sustained Focused Attention: Appears intact Sustained Attention: Appears intact Selective Attention: Impaired Selective Attention Impairment: Verbal complex;Functional complex Memory: Impaired Memory Impairment: Storage deficit Awareness: Impaired Awareness Impairment: Intellectual impairment;Emergent impairment;Anticipatory impairment Problem Solving: Impaired Problem Solving Impairment: Verbal basic;Functional basic Executive Function: Reasoning;Decision Making;Self Monitoring;Self Correcting Reasoning: Impaired Reasoning Impairment: Verbal complex;Functional complex Decision Making: Impaired Decision Making Impairment: Verbal complex;Functional complex Self Monitoring: Impaired Self Monitoring Impairment: Functional basic;Verbal basic Self Correcting: Impaired Self Correcting Impairment: Verbal basic;Functional basic Behaviors:  Impulsive;Confabulation Safety/Judgment: Impaired    Comprehension  Auditory Comprehension Overall Auditory Comprehension: Impaired Commands: Impaired Multistep Basic Commands: 50-74% accurate    Expression Verbal Expression Overall Verbal Expression: Appears within functional limits for tasks  assessed   Oral / Motor Oral Motor/Sensory Function Overall Oral Motor/Sensory Function: Appears within functional limits for tasks assessed Motor Speech Overall Motor Speech: Appears within functional limits for tasks assessed   GO    Harlon Ditty, MA CCC-SLP [phone]  Claudine Mouton 04/17/2012, 4:21 PM

## 2012-04-17 NOTE — Progress Notes (Signed)
PT Cancellation Note  Patient Details Name: Shimon Kanitz MRN: [id number] DOB: 1960/12/26   Cancelled Treatment:    Reason Eval/Treat Not Completed: Fatigue/lethargy limiting ability to participate   Allicia Culley, Alison Murray 04/17/2012, 9:11 AM

## 2012-04-17 NOTE — Discharge Summary (Signed)
Physician Discharge Summary  Antwonne Saelee ZOX:[id number] DOB: 07-01-1960 DOA: 03/25/2012  PCP: Robert Bellow, PA  Admit date: 03/25/2012 Discharge date: 04/17/2012  Time spent: >30 minutes  Recommendations for Outpatient Follow-up:      Follow-up Information    Follow up with BUCKLAND,DAVID, PA. (next week, call for appt upon discharge)    Contact information:   [address] [place] [phone]           Discharge Diagnoses:  Principal Problem:  *Septic shock(785.52) Active Problems:  Pneumococcal pneumonia  Acute respiratory failure  Diabetes  Acute encephalopathy  Acute renal failure   Discharge Condition: Improved/stable  Diet recommendation: Modified carbohydrate  Filed Weights   04/13/12 2044 04/14/12 2132 04/15/12 2053  Weight: 105.235 kg (232 lb) 106.1 kg (233 lb 14.5 oz) 104.3 kg (229 lb 15 oz)    History of present illness:  The pt is a 51 yo white male with past medical hx of obesity, presumed OHS, COPD (3ppd smoker), uncontrolled DM, uncontrolled HTN, alcohol use, opioid use who was brought to [facility] ED on 10/8 with hypotension and respiratory distress. He was then intubated for acute respiratory failure and diffuse bilateral infiltrates.    Hospital Course by problem list:  Acute respiratory failure. Pneumonia mostly likely CAP +/- ARDS. Less likely pulmonary edema. Suspected COPD (without evidence of exacerbation) - smokes 2-3 packs of cigarettes per day. Patient was started on antibiotics with little initial improvement. (see all culture and antibiotic results below) He was also on sepsis protocol, as well as ARDS protocol. Combivent was continued scheduled. Patient's fevers resolved and he clinically improved greatly. He completed course of Vanc/Zosyn. Day after completing antibiotics, fevers returned. He was restarted on antibiotics for additional 10 days. Patient was unable to wean from vent, therefore tracheostomy was placed on  10/17. He did well with that procedure and was able to quickly wean. Therefore, he was decannulated on 10/24 and has been able to fully protect his airway on room air. The patient was then transferred to the hospitalist service beginning 10/27 and has continued to oxygenate well on room air, his nebulized bronchodilators were change to when necessary as and he has not been requiring them per nursing staff. He has remained afebrile and hemodynamically stable and he would not require any further antibiotics upon discharge since he completed adequate treatment in house. Cultures:  10/8 urine>>no growth  10/8 Blood >>> GPC in pairs >> Strep pneumo  10/8 Respiratory >>> Strep pneumo  10/9 nasal influ>> neg  10/19 BCx2> neg  10/18 resp c/s>>>nl flora  10/18 UC >>>neg  10/18 C. Diff >>neg  Antibiotics:  Azithromycin 10/8 x 1  Ceftriaxone 10/8 x 1  Zosyn 10/8 >>> 10/11  Vancomycin 10/8 >>> 10/11  Levaquin 10/8 (has best MIC vs pneumococcus at this hospital, higher than ceftriaxone) >>> 10/19  Tamiflu 10/9>>10/10  10/19 zosyn (?hcap)>>  10/19 vanco (?hcap)>>  Shock. Required pressors on admission, that were weaned off on day #5. He required volume, as well as course of stress dose steroids.  Sepsis. Patient's blood grew Strep pneumo. His echo was normal. He also had repeat blood cultures that had no growth after antibiotics. He completed the full antibiotic course for treatment on 10/19 ARF. Patient developed ARF secondary to shock. He was started on CRRT on 10/10 via right IJ HD cath, which he tolerated well. Creat improved and continuous dialysis was stopped on 10/16. Patient did not require intermittent dialysis, and his creat has remained  stable. His lisinopril and metformin were discontinued, his creatinine today prior to discharge is 1.45, and his been instructed to stay off lisinopril and metformin as above. His to followup with his PCP for further monitoring of his renal function and adjustment  of his medications as appropriate.  Acute encephalopathy. Patient had encephalopathy with difficulty sedating. He required very high doses of sedation while he was on the vent as well as when he was weaning off. This could be secondary to his prior alcohol and opioid use. Neurology was consulted. Head CT did show a possible infarct, but MRI could not be completed but the part that was done was read as no acute infarct. As he was weaning, patient was transitioned to PO Methadone, Risperdal, and Ativan. By time of transfer to the floor, he was off all sedating medications except Ativan which was continued for alcohol use. Patient has not been requiring any further Ativan and his his mentation has improved. Psychiatry was consulted to see patient regarding capacity-Dr. Bogard saw the patient today and stated that he has the capacity to make decisions regarding going to a nursing facility. Physical therapy had recommended skilled nursing and he was placed at [facility] but he refused, stating that he wants to go home-he'll be discharged with home health are RN for safety eval. Shock liver. AST/ALT greatly elevated. Also has history of alcohol use. LFTs were trending and did normalize. No further issues  A.fib with RVR. Patient developed A.fib with RVR. He was started on Diltiazem drip which controlled his rate well. He continued on the drip for multiple days, and was transitioned to PO once stabilized. Patient stayed on PO for one week, and it was d/c with no further issues. Per CCM this was secondary to his above critical illness as above. He remained in normal sinus rhythm off the Cardizem for the rest of his hospitalization. He is to follow up outpatient with his PCP for further monitoring and appropriate treatment. HTN. Once pressors were weaned, patient became hypertensive. He has a history of uncontrolled HTN that was controlled with Hydralazine TID with Labetalol prn.  His blood pressure has been well  controlled on the hydralazine and his to continue this upon discharge. Lisinopril was DC'd as above. Hypernatremia. Unsure of etiology, perhaps polypharmacy? No intracranial reasons identified. Corrected slowly with free water, which was stopped prior to discharge  DM. Known history of uncontrolled DM. CBG controlled with SSI and Lantus while in the hospital. It was noted that he was on 70/30 to admission and his to continue his outpatient insulin upon discharge. As discussed above his been instructed to discontinue the metformin, to followup with his private care physician for further monitoring of his blood sugars and management as appropriate. Anemia. HgB trended down through ICU admission. No clear source of bleeding. Iron panel does not indicate iron deficiency. Most likely due to critical illness, he has had no evidence of active bleeding- last hemoglobin 8.3 prior to discharge.  Consultants:  PCCM  [facility] Procedures:  10/8intubation and mechanical ventilation  10/9 placed on ARDS protocol  10/10 HD cath placed, CVVH started 10/10-10/16  10/17 Trach placement  10/24 Decannulated   Discharge Exam: Filed Vitals:   04/16/12 1800 04/16/12 2110 04/17/12 0513 04/17/12 1044  BP: 124/76 128/84 117/72 119/80  Pulse: 90 98 108 102  Temp: 97.9 F (36.6 C) 98.7 F (37.1 C) 98.2 F (36.8 C) 98.3 F (36.8 C)  TempSrc: Oral Oral Oral Oral  Resp: 20 20  18 20  Height:      Weight:      SpO2: 99% 98% 99% 99%    Exam:  General: Alert, not oriented x3. In no apparent distress Cardiovascular: RRR, no M, R,G  Respiratory: ctab,  Abdomen: soft, NT/ND, no organomegaly and no masses palpable.  Extremities: No cyanosis and no edema    Discharge Instructions  Discharge Orders    Future Orders Please Complete By Expires   Diet Carb Modified      Increase activity slowly      Home Health      Questions: Responses:   To provide the following care/treatments RN   Face-to-face encounter       Comments:   Jethro Poling C certify that this patient is under my care and that I, or a nurse practitioner or physician's assistant working with me, had a face-to-face encounter that meets the physician face-to-face encounter requirements with this patient on 04/17/2012.   Questions: Responses:   The encounter with the patient was in whole, or in part, for the following medical condition, which is the primary reason for home health care metabolic encephalopathy, sepsis, pna   I certify that, based on my findings, the following services are medically necessary home health services Nursing   My clinical findings support the need for the above services High Risk for rehospitalization   Further, I certify that my clinical findings support that this patient is homebound due to: Unable to leave home safely without assistance   To provide the following care/treatments RN       Medication List     As of 04/17/2012  2:14 PM    STOP taking these medications         lisinopril 5 MG tablet   Commonly known as: PRINIVIL,ZESTRIL      metFORMIN 1000 MG tablet   Commonly known as: GLUCOPHAGE      TAKE these medications         hydrALAZINE 25 MG tablet   Commonly known as: APRESOLINE   Take 1 tablet (25 mg total) by mouth every 8 (eight) hours.      insulin NPH-insulin regular (70-30) 100 UNIT/ML injection   Commonly known as: NOVOLIN 70/30   Inject 35 Units into the skin daily.      isosorbide mononitrate 15 mg Tb24   Commonly known as: IMDUR   Take 0.5 tablets (15 mg total) by mouth daily.      oxyCODONE 5 MG immediate release tablet   Commonly known as: Oxy IR/ROXICODONE   Take 1-2 tablets (5-10 mg total) by mouth 3 (three) times daily as needed.      PARoxetine 40 MG tablet   Commonly known as: PAXIL   Take 40 mg by mouth every morning.      phentermine 37.5 MG tablet   Commonly known as: ADIPEX-P   Take 37.5 mg by mouth daily before breakfast.      pregabalin 50 MG capsule    Commonly known as: LYRICA   Take 1 capsule (50 mg total) by mouth 2 (two) times daily.      traZODone 150 MG tablet   Commonly known as: DESYREL   Take 150 mg by mouth at bedtime.           Follow-up Information    Follow up with BUCKLAND,DAVID, PA. (next week, call for appt upon discharge)    Contact information:   [address] [place] [phone]  The results of significant diagnostics from this hospitalization (including imaging, microbiology, ancillary and laboratory) are listed below for reference.    Significant Diagnostic Studies: Ct Head Wo Contrast  04/08/2012  *RADIOLOGY REPORT*  Clinical Data: Altered mental status.  CT HEAD WITHOUT CONTRAST  Technique:  Contiguous axial images were obtained from the base of the skull through the vertex without contrast.  Comparison: None.  Findings: Motion degraded exam.  No intracranial hemorrhage.  Left hemispheric infarct not entirely excluded although this appearance may be related to motion artifact.  No intracranial mass lesion detected on this unenhanced exam.  No hydrocephalus.  Small vessel disease type changes suspected.  Partial opacification mastoid air cells more notable on the right. No obvious obstructing lesion posterior-superior nasopharynx causing eustachian tube dysfunction.  Right maxillary sinus mucosal thickening.  IMPRESSION: No intracranial hemorrhage.  Left hemispheric infarct not entirely excluded although this appearance may be related to motion artifact.  This has been made a PRA call report utilizing dashboard call feature.   Original Report Authenticated By: Fuller Canada, M.D.    Mr Brain Wo Contrast  04/15/2012  *RADIOLOGY REPORT*  Clinical Data: Diabetic hypertensive patient with altered mental status.  MRI HEAD WITHOUT CONTRAST  Technique:  Multiplanar, multiecho pulse sequences of the brain and surrounding structures were obtained according to standard protocol without  intravenous contrast.  Comparison: 04/08/2012 CT.  No comparison MR.  Findings: The examination is limited to three sequences.  The patient refused to continue the exam.  No acute infarct.  No obvious intracranial hemorrhage or mass.  Mild global atrophy without hydrocephalus.  Altered signal intensity bone marrow.  This may be related to patient's underlying anemia.  Major intracranial vascular structures are patent with branching or fenestrated right vertebral artery.  Mild exophthalmos.  Opacification mastoid air cells and middle ear cavities bilaterally.  IMPRESSION: The examination is limited to three sequences.  The patient refused to continue the exam.  No acute infarct.  No obvious intracranial hemorrhage or mass.  Mild global atrophy without hydrocephalus.  Altered signal intensity bone marrow.  This may be related to patient's underlying anemia.  Opacification mastoid air cells and middle ear cavities bilaterally.   Original Report Authenticated By: Fuller Canada, M.D.    Dg Chest Port 1 View  04/09/2012  *RADIOLOGY REPORT*  Clinical Data: ARDS  PORTABLE CHEST - 1 VIEW  Comparison: 04/07/2012  Findings: Tracheostomy in satisfactory position.  PICC line has been placed on the right side with the tip at the cavoatrial junction.  NG tube in the stomach.  Bibasilar airspace disease shows slight interval improvement.  This may be atelectasis or pneumonia.  IMPRESSION: Right arm PICC tip in the cavoatrial junction.  Mild improvement in bibasilar atelectasis/infiltrate.   Original Report Authenticated By: Camelia Phenes, M.D.    Dg Chest Port 1 View  04/07/2012  *RADIOLOGY REPORT*  Clinical Data: Respiratory failure  PORTABLE CHEST - 1 VIEW  Comparison: Multiple recent previous exams.  Findings: 0525 hours.  Lung volumes are stable. Cardiopericardial silhouette is at upper limits of normal for size.  Tracheostomy tube again noted.  Right IJ central line tip projects at the level of the lower right  internal jugular vein.  Left IJ catheter tip projects near the innominate vein confluence. The NG tube passes into the stomach although the distal tip position is not included on the film.  Stable vascular congestion. Bibasilar airspace disease, left greater than right, stable.  IMPRESSION: No substantial change  in exam.   Original Report Authenticated By: ERIC A. MANSELL, M.D.    Dg Chest Port 1 View  04/06/2012  *RADIOLOGY REPORT*  Clinical Data: Pneumonia and respiratory failure  PORTABLE CHEST - 1 VIEW  Comparison: 04/05/2012  Findings: Tracheostomy tube tip is above the carina.  There is a left IJ catheter with tip in the projection of the distal left innominate vein.  The heart size appears normal.  There are low lung volumes.  Bibasilar opacities appear stable from previous exam.  IMPRESSION:  1.  Stable support apparatus. 2.  No change in bibasilar opacities.   Original Report Authenticated By: Rosealee Albee, M.D.    Dg Chest Port 1 View  04/05/2012  *RADIOLOGY REPORT*  Clinical Data: Tracheostomy tube and central line.  Diabetes. Hypertension.  PORTABLE CHEST - 1 VIEW  Comparison: 04/03/2012  Findings: Tracheostomy tube tip projects over the tracheal air shadow.  Left IJ line which previously extended across to the upper IVC has been retracted somewhat and projects over the brachial cephalic vein.  The right jugular line is observed projecting over the jugular vein region.  Slightly improved aeration noted at both lung bases, with some persistent airspace opacities.  Blunted left lateral costophrenic angle.  No pneumothorax.  IMPRESSION:  1.  Mildly improved bibasilar airspace opacities. 2.  Left IJ line has retracted somewhat, with tip projecting over the brachial cephalic vein.  Right jugular line projects over the right lower neck/lung apex.   Original Report Authenticated By: Dellia Cloud, M.D.    Chest Portable 1 View To Assess Tube Placement And Rule-out Pneumothorax  04/03/2012   *RADIOLOGY REPORT*  Clinical Data: Post tracheostomy tube placement.  Rule out pneumothorax.  PORTABLE CHEST - 1 VIEW  Comparison: 04/03/2012 6:19 a.m.  Findings: Tracheostomy tube tip midline.  Left central line tip distal left bracheocephalic vein level.  Right central line tip projects over the right lung apex unchanged in position.  Exact position indeterminate.  No gross pneumothorax.  Heart size top normal.  Pulmonary vascular congestion/mild pulmonary edema.  Consolidation left base may represent atelectasis or infiltrate.  Small pleural effusions not excluded.  Limited evaluation of the aorta.  IMPRESSION: Tracheostomy tube tip midline.  No gross pneumothorax.  Pulmonary vascular congestion/mild pulmonary edema.  Consolidation left base may represent atelectasis or infiltrate.  Heart size top normal.  Left central line tip distal left bracheocephalic vein level.  Right central line tip projects over the right lung apex unchanged in position.  Exact position indeterminate.  This is a call report.  No   Original Report Authenticated By: Fuller Canada, M.D.    Dg Chest Port 1 View  04/03/2012  *RADIOLOGY REPORT*  Clinical Data: Check endotracheal tube position.  PORTABLE CHEST - 1 VIEW  Comparison: April 02, 2012.  Findings: Endotracheal tube is in grossly good position with tip several centimeters above the carina.  Right internal jugular catheter tip is unchanged in position projected over expected position of right internal jugular vein.  Left internal jugular catheter appears to have been pulled back several centimeters with tip in the expected position of the left brachiocephalic vein. Nasogastric tube is seen entering the stomach.  Right lung is clear.  Left basilar opacity is noted consistent with subsegmental atelectasis or pneumonia.  IMPRESSION: Left internal jugular catheter tip appears to have been pulled back slightly compared to prior exam.  Left basilar opacity is noted concerning for  subsegmental atelectasis or pneumonia.   Original Report  Authenticated By: Venita Sheffield., M.D.    Dg Chest Port 1 View  04/02/2012  *RADIOLOGY REPORT*  Clinical Data: Shortness of breath, endotracheal tube, history diabetes, hypertension  PORTABLE CHEST - 1 VIEW  Comparison: Portable exam 0503 hours compared to 04/01/2012  Findings: Tip of endotracheal tube 3.5 cm above carina. Nasogastric is in the stomach. Left jugular central venous catheter tip projecting over SVC. Right jugular central venous catheter tip projecting over right jugular vein near the right subclavian vein confluence. Minimal cardiac enlargement. Perihilar basilar opacities question pulmonary edema though there is a component of bibasilar atelectasis as well. No gross pleural effusion or pneumothorax.  IMPRESSION: No interval change.   Original Report Authenticated By: Lollie Marrow, M.D.    Dg Chest Port 1 View  04/01/2012  *RADIOLOGY REPORT*  Clinical Data: Evaluate endotracheal tube.  PORTABLE CHEST - 1 VIEW  Comparison: 03/30/2012  Findings: Support devices are unchanged.  Cardiomegaly with bilateral airspace disease, likely edema.  No visible effusions. No acute bony abnormality.  IMPRESSION: No interval change pulmonary edema pattern.   Original Report Authenticated By: Cyndie Chime, M.D.    Dg Chest Port 1 View  03/30/2012  *RADIOLOGY REPORT*  Clinical Data: Evaluate endotracheal tube  PORTABLE CHEST - 1 VIEW  Comparison: 03/29/2012  Findings: Endotracheal tube terminates 4 cm above the carina.  Cardiomegaly with mild interstitial edema.  Patchy left lower lobe opacity, atelectasis versus pneumonia.  Suspected small left pleural effusion.  No pneumothorax.  Stable bilateral IJ venous catheters.  IMPRESSION: Endotracheal tube terminates 4 cm above the carina.  Cardiomegaly with mild interstitial edema and suspected small left pleural effusion.  Patchy left lower lobe opacity, atelectasis versus pneumonia.   Original Report  Authenticated By: Charline Bills, M.D.    Dg Chest Port 1 View  03/29/2012  *RADIOLOGY REPORT*  Clinical Data: Pneumonia, endotracheal tube  PORTABLE CHEST - 1 VIEW  Comparison: 03/28/2012  Findings: Endotracheal tube terminates 5 cm above the carina.  Enteric tube courses below the diaphragm.  Left IJ venous catheter terminates in the left brachiocephalic vein.  Bilateral interstitial and patchy airspace opacities, favored to reflect interstitial edema, underlying bilateral lower lobe pneumonia not excluded.  Suspected small left pleural effusion.  No pneumothorax.  The heart is mildly enlarged.  IMPRESSION: Endotracheal tube terminates 5 cm above the carina.  Cardiomegaly with suspected mild interstitial edema and small left pleural effusion.  Underlying bilateral lower lobe pneumonia is not excluded.   Original Report Authenticated By: Charline Bills, M.D.    Dg Chest Port 1 View  03/28/2012  *RADIOLOGY REPORT*  Clinical Data: Pneumonia.  PORTABLE CHEST - 1 VIEW  Comparison: 03/27/2012.  Findings: Endotracheal tube tip 3.7 cm above the carina.  Right internal jugular catheter tip projects over the right lung apex possibly at the junction with the subclavian vein. Clinical correlation recommended.  Position unchanged.  Left internal jugular vein tip projects at the expected level of the distal left bracheocephalic vein.  Position unchanged.  Nasogastric tube courses below the diaphragm.  The tip is not included on this exam.  Diffuse air space disease slightly asymmetric greater on the left unchanged.  Question left-sided pleural effusion?  Cardiomegaly.  Tortuous aorta.  IMPRESSION: Diffuse air space disease slightly greater on the left without significant change.  Findings raise possibility of left-sided pleural effusion.  Cardiomegaly.  Bilateral central lines in place.  Please see above discussion.   Original Report Authenticated By: Fuller Canada, M.D.  Dg Chest Portable 1 View  03/27/2012   *RADIOLOGY REPORT*  Clinical Data: Right-sided vascular catheter placement.  PORTABLE CHEST - 1 VIEW  Comparison: 03/27/2012.  Findings: Endotracheal tube and enteric tube are unchanged.  Left IJ central line unchanged.  Interval introduction of right IJ vascular catheter with the tip in the right brachiocephalic vein.  Tip is just below the level of the clavicles.  Diffuse airspace disease remains present in the lungs, likely representing pulmonary edema.  The cardiopericardial silhouette and mediastinal contours unchanged.  IMPRESSION: 1. Interval placement of right IJ vascular catheter with the tip in the upper chest, probably within the right brachiocephalic vein. No pneumothorax. 2.  Unchanged pulmonary aeration and other support apparatus.   Original Report Authenticated By: Andreas Newport, M.D.    Dg Chest Port 1 View  03/27/2012  *RADIOLOGY REPORT*  Clinical Data: Pneumonia, intubated  PORTABLE CHEST - 1 VIEW  Comparison: Prior chest x-ray yesterday, 03/26/2012  Findings: The patient is rotated to the left.  Unchanged position of endotracheal tube, left IJ central venous catheter and nasogastric tube.  Of note, the tip of the left IJ central venous catheter projects over the distal left brachiocephalic vein.  Unchanged cardiomegaly. Slightly improved aeration with a mild decrease in diffuse bilateral interstitial and airspace opacities. There is space disease remains most confluent in the left base. Probable left pleural effusion.  IMPRESSION:  1.  Slightly improved aeration consistent with improving interstitial edema. 2.  Background of diffuse bilateral interstitial and airspace opacities most confluent in the left base likely represents a combination of multi focal pneumonia with superimposed but improving edema. 3.  Probable left basilar pleural effusion 4.  Support apparatus as above.  Please note the tip of the left IJ approach central venous catheter is located in the left brachiocephalic vein.    Original Report Authenticated By: Sterling Big, M.D.    Dg Chest Port 1 View  03/26/2012  *RADIOLOGY REPORT*  Clinical Data: Assess endotracheal tube.  PORTABLE CHEST - 1 VIEW  Comparison: 03/25/2012.  Findings: Endotracheal tube is in satisfactory position. Nasogastric tube is followed into the stomach.  Left IJ central line tip projects over the brachiocephalic vein junction.  Heart is enlarged, stable.  Severe diffuse bilateral air space disease is unchanged.  Probable left pleural effusion.  IMPRESSION:  1. Severe diffuse bilateral air space disease is unchanged and may be due to edema.  Superimposed pneumonia at the left lung base is also considered. 2.  Possible left pleural effusion.   Original Report Authenticated By: Reyes Ivan, M.D.    Dg Chest Portable 1 View  03/25/2012  *RADIOLOGY REPORT*  Clinical Data: Central line placement.  PORTABLE CHEST - 1 VIEW  Comparison: 03/25/2012 at to 5:18 p.m.  Findings: Left IJ line tip projects over the upper SVC.  No pneumothorax.  ET tube tip 3.9 cm above the carina.  Cardiomegaly is present with bilateral airspace opacities which appear worsened.  Nasogastric tube enters the stomach.  Low lung volumes noted.  IMPRESSION: 1.  Left IJ line tip:  Upper SVC.  2.  Worsened bilateral airspace opacities.  3.  Cardiomegaly.   Original Report Authenticated By: Dellia Cloud, M.D.    Dg Chest Portable 1 View  03/25/2012  *RADIOLOGY REPORT*  Clinical Data: Respiratory distress.  PORTABLE CHEST - 1 VIEW  Comparison: No priors.  Findings: Single portable view of the chest demonstrates extensive interstitial and patchy multifocal airspace disease throughout the lungs bilaterally, most  confluent in the left mid and lower lung, concerning for multilobar pneumonia.  There is also likely a small left pleural effusion.  Assessment of pulmonary vasculature is limited by the background of multifocal interstitial and airspace disease.  A component of  underlying pulmonary edema is possible. Cardiac silhouette is partially obscured, but heart size appears borderline enlarged.  IMPRESSION: 1.  Extensive patchy multifocal asymmetrically distributed interstitial and airspace disease, as above, concerning for multilobar pneumonia. 2.  Superimposed pulmonary edema is possible, and if present, would be only mild. 3.  Possible small left pleural effusion.   Original Report Authenticated By: [name], M.D.    Dg Swallowing Func-speech Pathology  04/11/2012  Vivi Ferns McCoy, CCC-SLP     04/11/2012  1:48 PM Objective Swallowing Evaluation: Modified Barium Swallowing Study   Patient Details  Name: Srivatsa Mashek MRN: [id number] Date of Birth: May 27, 1961  Today's Date: 04/11/2012 Time: 1191-4782 SLP Time Calculation (min): 23 min  Past Medical History:  Past Medical History  Diagnosis Date  . Diabetes mellitus without complication   . Hypertension    Past Surgical History: History reviewed. No pertinent past  surgical history. HPI:  51 y.o. male admitted to the hospital on 03/25/12 for respiratory  failure and shock. Since admission, blood cultures and  respiratory cultures grew positive for strep pneumococcus,  patient suffered from STEMI likely secondary to shock,  ATN and  ARF (improving), Labile BG,  hypernatremia (from 10/18 to  current), hyperchloremia (10/18-current). Over last 24 hours  patient has shown a acute change in mental status. Initially  believed to be medication, Risperdal, Methadone and Ativan were  all decreased.  There was a improvement in his mental status  after these medications were decreased. However, CT of brain was  obtained and showed questionable left hemispheric infarct with  question of artifact; unable to participate in MRI - pt with  likely acute encephalopathy.   Intubated 10/8, trached 10/17;  decannulated 10/24.        Assessment / Plan / Recommendation Clinical Impression  Dysphagia Diagnosis: Within Functional Limits Clinical  impression: Patient presents with a functional  oropharyngeal swallow. Timing of swallowing initiation and  strength of swallow both appear WFL. Full airway protection  noted. Recommend resuming a regular diet, thin liquids with  continued general safe swallowing precautions to decrease  aspiration risks due to prolonged intubation. SLP will f/u  briefly to ensure tolerance at bedside.     Treatment Recommendation  Therapy as outlined in treatment plan below    Diet Recommendation Regular;Thin liquid   Liquid Administration via: Cup;Straw Medication Administration: Whole meds with liquid Supervision: Patient able to self feed;Intermittent supervision  to cue for compensatory strategies Compensations: Slow rate;Small sips/bites Postural Changes and/or Swallow Maneuvers: Seated upright 90  degrees    Other  Recommendations Oral Care Recommendations: Oral care BID   Follow Up Recommendations  Inpatient Rehab    Frequency and Duration min 2x/week  2 weeks   Pertinent Vitals/Pain n/a    SLP Swallow Goals Patient will utilize recommended strategies during swallow to  increase swallowing safety with: Supervision/safety Swallow Study Goal #2 - Progress: Progressing toward goal   General HPI: 51 y.o. male admitted to the hospital on 03/25/12 for  respiratory failure and shock. Since admission, blood cultures  and respiratory cultures grew positive for strep pneumococcus,  patient suffered from STEMI likely secondary to shock,  ATN and  ARF (improving), Labile BG,  hypernatremia (from 10/18 to  current), hyperchloremia (  10/18-current). Over last 24 hours  patient has shown a acute change in mental status. Initially  believed to be medication, Risperdal, Methadone and Ativan were  all decreased.  There was a improvement in his mental status  after these medications were decreased. However, CT of brain was  obtained and showed questionable left hemispheric infarct with  question of artifact; unable to participate in MRI - pt  with  likely acute encephalopathy.   Intubated 10/8, trached 10/17;  decannulated 10/24.    Type of Study: Modified Barium Swallowing Study Reason for Referral: Objectively evaluate swallowing function Diet Prior to this Study: Dysphagia 3 (soft);Nectar-thick liquids Temperature Spikes Noted: No Respiratory Status: Supplemental O2 delivered via (comment) History of Recent Intubation: Yes Length of Intubations (days): 9 days Date extubated:  (trached 10/17, decannulated 10/24) Behavior/Cognition: Alert;Cooperative;Confused;Impulsive Oral Cavity - Dentition: Adequate natural dentition Oral Motor / Sensory Function: Within functional limits Self-Feeding Abilities: Able to feed self Patient Positioning: Upright in bed Baseline Vocal Quality: Clear Volitional Cough: Strong Volitional Swallow: Able to elicit Anatomy: Within functional limits Pharyngeal Secretions: Not observed secondary MBS    Reason for Referral Objectively evaluate swallowing function   Oral Phase Oral Preparation/Oral Phase Oral Phase: WFL   Pharyngeal Phase Pharyngeal Phase Pharyngeal Phase: Within functional limits  Cervical Esophageal Phase    GO   Ferdinand Lango MA, CCC-SLP [phone]  Cervical Esophageal Phase Cervical Esophageal Phase: [facility]         McCoy Leah Meryl 04/11/2012, 1:48 PM      Microbiology: No results found for this or any previous visit (from the past 240 hour(s)).   Labs: Basic Metabolic Panel:  Lab 04/17/12 5621 04/16/12 0605 04/14/12 0535 04/13/12 1530 04/12/12 0500 04/11/12 0320 04/10/12 2100  NA 133* 136 136 -- 140 144 --  K 4.1 3.7 3.2* -- 3.1* 3.0* --  CL 99 103 104 -- 108 111 --  CO2 20 21 21  -- 23 22 --  GLUCOSE 179* 163* 182* -- 165* 181* --  BUN 16 14 15  -- 19 26* --  CREATININE 1.45* 1.53* 1.48* -- 1.50* 1.64* --  CALCIUM 9.7 9.5 8.9 -- 8.5 8.3* --  MG -- 1.6 -- 1.4* 1.4* -- 1.6  PHOS -- -- -- -- 3.7 -- 3.8   Liver Function Tests:  Lab 04/12/12 0500  AST 21  ALT 32  ALKPHOS 61  BILITOT 0.3  PROT  6.6  ALBUMIN 2.2*   No results found for this basename: LIPASE:5,AMYLASE:5 in the last 168 hours No results found for this basename: AMMONIA:5 in the last 168 hours CBC:  Lab 04/14/12 0535 04/12/12 0500 04/11/12 0320  WBC 6.3 6.6 6.6  NEUTROABS -- -- --  HGB 8.3* 7.8* 7.7*  HCT 24.8* 24.2* 24.4*  MCV 87.9 89.3 91.0  PLT 189 167 164   Cardiac Enzymes: No results found for this basename: CKTOTAL:5,CKMB:5,CKMBINDEX:5,TROPONINI:5 in the last 168 hours BNP: BNP (last 3 results)  Basename 03/25/12 1648  PROBNP 7610.0*   CBG:  Lab 04/17/12 1204 04/17/12 0756 04/16/12 2106 04/16/12 1646 04/16/12 1205  GLUCAP 146* 165* 171* 138* 164*       Signed:  Floride Hutmacher C  [facility] 04/17/2012, 2:14 PM

## 2012-04-17 NOTE — Consult Note (Signed)
Patient Identification:  Gregory [name] Date of Evaluation:  04/17/2012 Reason for Consult:Evaluate Capacity  Referring Provider: Dr. Donna Bernard  History of Present Illness: [facility] and acute respiratory distress. He has multiple complex medical conditions including COPD and a history of smoking 2-3 packs cigarettes per day. He is stabilized and is agitating to return home rather than they recommended transferred to skilled nursing facility to receive additional stability and supervision of medical care.  Past Psychiatric History: None provided   Past Medical History:     Past Medical History  Diagnosis Date  . Diabetes mellitus without complication   . Hypertension   . DDD (degenerative disc disease), lumbar       History reviewed. No pertinent past surgical history.  Allergies: No Known Allergies  Current Medications:  Prior to Admission medications   Medication Sig Start Date End Date Taking? Authorizing Provider  insulin NPH-insulin regular (NOVOLIN 70/30) (70-30) 100 UNIT/ML injection Inject 35 Units into the skin daily.   Yes Historical Provider, MD  lisinopril (PRINIVIL,ZESTRIL) 5 MG tablet Take 5 mg by mouth daily.   Yes Historical Provider, MD  metFORMIN (GLUCOPHAGE) 1000 MG tablet Take 1,000 mg by mouth 2 (two) times daily with a meal.   Yes Historical Provider, MD  oxycodone (ROXICODONE) 30 MG immediate release tablet Take 30 mg by mouth every 4 (four) hours.   Yes Historical Provider, MD  PARoxetine (PAXIL) 40 MG tablet Take 40 mg by mouth every morning.   Yes Historical Provider, MD  phentermine (ADIPEX-P) 37.5 MG tablet Take 37.5 mg by mouth daily before breakfast.   Yes Historical Provider, MD  pregabalin (LYRICA) 300 MG capsule Take 300 mg by mouth 2 (two) times daily.   Yes Historical Provider, MD  traZODone (DESYREL) 150 MG tablet Take 150 mg by mouth at bedtime.   Yes Historical Provider, MD    Social History:    reports that he has been smoking  Cigarettes.  He has been smoking about 3 packs per day. He does not have any smokeless tobacco history on file. He reports that he does not drink alcohol or use illicit drugs.   Family History:    History reviewed. No pertinent family history.  Mental Status Examination/Evaluation: Objective:  Appearance: Casual  Psychomotor Activity:  Increased  Eye Contact::  Good  Speech:  Clear and Coherent  Volume:  Normal  Mood:  Anxious and Euphoric euthymic  Affect:  Appropriate and Congruent  Thought Process:  Coherent, Relevant, Intact and He is able to review necessary care at home in anticipation of entertaining his grandchildren for Halloween  Orientation:  Full  Thought Content:   Focused on necessary modifications in habit, e.g. No smoking indoors clearing the atmosphere stale secondhand smoke,  trying to stop smoking, anticipating grandchildren before  trick-or-treating   Suicidal Thoughts:  No  Homicidal Thoughts:  No  Judgement:  Fair  Insight:  Good    DIAGNOSIS:   AXIS I   Delirium related to acute respiratory distress, COPD   AXIS II  Deffered  AXIS III See medical notes.  AXIS IV other psychosocial or environmental problems, problems related to social environment and Physical limitations secondary to complex medical problems requiring walker for stability  AXIS V 41-50 serious symptoms   Assessment/Plan:  discussed with Dr. Donna Bernard  this patient is sitting in his room alert and expresses himself well. The sitter leaves the room. He states that he is wanting to go home and he has  a girlfriend who is willing to spend the week with him, leaving her work he provides name and phone number. He also states his 2 young daughters will be coming over this afternoon. He also has grandchildren who will be in the house.   A review of his admission, respiratory distress is discussed with focus on his intent to stop smoking. He admits to a need to stop smoking and recognizes that secondhand  smoke is also detrimental to the grand children come to visit him.  He states that he checks his blood glucose morning and night and tries to follow a reasonable diet. He states he has recently been able to lose weight. He also says he has a PCP and will be making an appointment to see him as indices of discharge. He had an appointment with him today which he needed to cancel. He also states that he uses a walker at home which gives him greater stability. He believes his girlfriend will be there to help him with his medications, meals and any other assistance requiring mobility are getting to doctor appointments.   He is quite resistant to the recommendation to spend time in a SNF. He is aware of today's date he is able to remember 3 of 3 items. He does remember a series of 8 digits. He is rather literal with a proper and states that he would mail a letter found addressed and stamped. He is able to define 2 items 1 vegetable and the other fruit as "food".  He has a normal rate of speech, organized and sequential. He denies auditory, visual hallucinations. He denies suicidal thoughts, homicidal thoughts. He is very focused on his grandchildren in anticipation of seeing them when they arrived this afternoon in costume. He is also aware of this serious harmful effects of second hand smoke and agrees to make a serious effort to stop smoking and air out the house.   This evaluation is reported to Dr. Donna Bernard.  There is no evidence to support he is confused or delusional  RECOMMENDATION:   1. This patient has the capacity to understand his medical conditions and then necessary treatment he is requested to follow 2.    this patient insists that he has a girlfriend who will take time from work to spend 5 days for him as he adjusts to living at home again. 3.   In concern for his stability and walking, he states that he has been using a walker at home prior to this admission and plans to use it when he returns. 4.     Agree with paroxetine, trazodone, pregabalin,. 5.  No further psychiatric needs identified.  M.D. Psychiatrist signs off  Marlia Schewe J. Ferol Luz, MD Psychiatrist  04/17/2012  12:02 PM

## 2012-04-18 NOTE — Progress Notes (Signed)
[facility]  Patient Status: New  [facility] is providing the following services: RN and MSW  If patient discharges after hours, please call [phone].   Gregory [name] 04/18/2012, 12:40 PM

## 2012-05-13 ENCOUNTER — Emergency Department (HOSPITAL_COMMUNITY)
Admission: EM | Admit: 2012-05-13 | Discharge: 2012-05-13 | Disposition: A | Discharge status: Home / Self Care | Payer: Self-pay | Attending: Emergency Medicine | Admitting: Emergency Medicine

## 2012-05-13 ENCOUNTER — Encounter (HOSPITAL_COMMUNITY): Payer: Self-pay | Admitting: Emergency Medicine

## 2012-05-13 DIAGNOSIS — Z79899 Other long term (current) drug therapy: Secondary | ICD-10-CM | POA: Insufficient documentation

## 2012-05-13 DIAGNOSIS — F17200 Nicotine dependence, unspecified, uncomplicated: Secondary | ICD-10-CM | POA: Insufficient documentation

## 2012-05-13 DIAGNOSIS — Z794 Long term (current) use of insulin: Secondary | ICD-10-CM | POA: Insufficient documentation

## 2012-05-13 DIAGNOSIS — E119 Type 2 diabetes mellitus without complications: Secondary | ICD-10-CM | POA: Insufficient documentation

## 2012-05-13 DIAGNOSIS — M51379 Other intervertebral disc degeneration, lumbosacral region without mention of lumbar back pain or lower extremity pain: Secondary | ICD-10-CM | POA: Insufficient documentation

## 2012-05-13 DIAGNOSIS — M5137 Other intervertebral disc degeneration, lumbosacral region: Secondary | ICD-10-CM | POA: Insufficient documentation

## 2012-05-13 DIAGNOSIS — I10 Essential (primary) hypertension: Secondary | ICD-10-CM | POA: Insufficient documentation

## 2012-05-13 DIAGNOSIS — H539 Unspecified visual disturbance: Secondary | ICD-10-CM | POA: Insufficient documentation

## 2012-05-13 LAB — CBC WITH DIFFERENTIAL/PLATELET
Basophils Relative: 1 % (ref 0–1)
Hemoglobin: 9 g/dL — ABNORMAL LOW (ref 13.0–17.0)
Lymphocytes Relative: 40 % (ref 12–46)
MCHC: 31.7 g/dL (ref 30.0–36.0)
Monocytes Relative: 6 % (ref 3–12)
Neutro Abs: 3.8 10*3/uL (ref 1.7–7.7)
Neutrophils Relative %: 52 % (ref 43–77)
RBC: 3.06 MIL/uL — ABNORMAL LOW (ref 4.22–5.81)
WBC: 7.4 10*3/uL (ref 4.0–10.5)

## 2012-05-13 LAB — URINALYSIS, ROUTINE W REFLEX MICROSCOPIC
Bilirubin Urine: NEGATIVE
Leukocytes, UA: NEGATIVE
Nitrite: NEGATIVE
Specific Gravity, Urine: 1.008 (ref 1.005–1.030)
pH: 6 (ref 5.0–8.0)

## 2012-05-13 LAB — BASIC METABOLIC PANEL
BUN: 13 mg/dL (ref 6–23)
CO2: 27 mEq/L (ref 19–32)
Chloride: 99 mEq/L (ref 96–112)
GFR calc Af Amer: >90 mL/min (ref >90)
Potassium: 3.1 mEq/L — ABNORMAL LOW (ref 3.5–5.1)

## 2012-05-13 NOTE — ED Notes (Signed)
Pt went to see an eye care place today  Was sent  Here for further care they states that he has optic nerve  Edema in left eye w/ hemorrheges

## 2012-05-13 NOTE — ED Provider Notes (Signed)
History     CSN: [id number]  Arrival date & time 05/13/12  1308   First MD Initiated Contact with Patient 05/13/12 1327      No chief complaint on file.   (Consider location/radiation/quality/duration/timing/severity/associated sxs/prior treatment) HPI Comments: Gregory [name] 51 y.o. male   The chief complaint is: visual disturbance    51 year old male presents to the emergency department with a visual disturbance of the left eye.  He comes at the behest of Gregory [name] that he saw today.  Patient was admitted personally one month ago for pneumonia and septicemia.  He was on a ventilator in the ICU he had encephalopathy.  CT scan and MRI did not show acute abnormality at that time which was 3 weeks ago. Patient saw an optometrist at [facility] who stated that all he had was some conjunctival hemorrhage that would get better within the next 6 weeks.  Patient saw optometrist today and does I sent her in aspirin [place] who did a dilated eye exam.  She felt the eye exam showed optic nerve edema of the left eye with retinal hemorrhage she wanted him to the emergency department to rule out increased ICP versus compressive lesion or optic neuritis. Patient states that he left the ICU with no vision in the left eye and over time has had increased vision.  He cannot c-collar but he can see she can form.   He denies visual field deficit, headache, pain in the eye, scotoma,, blurred or double vision.  Patient denies any focal neurologic deficits.  Denies fevers, chills, myalgias, arthralgias. Denies DOE, SOB, chest tightness or pressure, radiation to left arm, jaw or back, or diaphoresis. Denies dysuria, flank pain, suprapubic pain, frequency, urgency, or hematuria. Denies headaches, light headedness, weakness, visual disturbances. Denies abdominal pain, nausea, vomiting, diarrhea or constipation.    The history is provided by the patient. No language interpreter was used.    Past Medical  History  Diagnosis Date  . Diabetes mellitus without complication   . Hypertension   . DDD (degenerative disc disease), lumbar     History reviewed. No pertinent past surgical history.  No family history on file.  History  Substance Use Topics  . Smoking status: Current Every Day Smoker -- 3.0 packs/day    Types: Cigarettes  . Smokeless tobacco: Not on file  . Alcohol Use: No      Review of Systems  All other systems reviewed and are negative.    Allergies  Review of patient's allergies indicates no known allergies.  Home Medications   Current Outpatient Rx  Name  Route  Sig  Dispense  Refill  . HYDRALAZINE HCL 25 MG PO TABS   Oral   Take 1 tablet (25 mg total) by mouth every 8 (eight) hours.   90 tablet   0   . HYDROCODONE-ACETAMINOPHEN 10-325 MG PO TABS   Oral   Take 1 tablet by mouth every 6 (six) hours as needed. For pain         . INSULIN ISOPHANE & REGULAR (70-30) 100 UNIT/ML [place] SUSP   Subcutaneous   Inject 35 Units into the skin daily.         . ISOSORBIDE MONONITRATE 15 MG HALF TABLET   Oral   Take 0.5 tablets (15 mg total) by mouth daily.   30 tablet   0   . PAROXETINE HCL 40 MG PO TABS   Oral   Take 40 mg by mouth every morning.         Marland Kitchen  PREGABALIN 300 MG PO CAPS   Oral   Take 300 mg by mouth 2 (two) times daily.         . TRAZODONE HCL 150 MG PO TABS   Oral   Take 150 mg by mouth at bedtime.           BP 128/75  Pulse 77  Temp 98.2 F (36.8 C) (Oral)  Resp 20  SpO2 97%  Physical Exam  Nursing note and vitals reviewed. Constitutional: He appears well-developed and well-nourished. No distress.  HENT:  Head: Normocephalic and atraumatic.  Eyes: Conjunctivae normal are normal. No scleral icterus.  Fundoscopic exam:      The right eye shows no arteriolar narrowing, no AV nicking, no hemorrhage and no papilledema. The right eye shows red reflex.      The left eye shows no arteriolar narrowing, no AV nicking, no  hemorrhage and no papilledema. The left eye shows red reflex. Neck: Normal range of motion. Neck supple.  Cardiovascular: Normal rate, regular rhythm and normal heart sounds.   Pulmonary/Chest: Effort normal and breath sounds normal. No respiratory distress.  Abdominal: Soft. There is no tenderness.  Musculoskeletal: He exhibits no edema.  Neurological: He is alert.  Skin: Skin is warm and dry. He is not diaphoretic.  Psychiatric: His behavior is normal.    ED Course  Procedures (including critical care time)  Labs Reviewed  CBC WITH DIFFERENTIAL - Abnormal; Notable for the following:    RBC 3.06 (*)     Hemoglobin 9.0 (*)     HCT 28.4 (*)     RDW 16.2 (*)     All other components within normal limits  BASIC METABOLIC PANEL - Abnormal; Notable for the following:    Potassium 3.1 (*)     Glucose, Bld 191 (*)     GFR calc non Af Amer 81 (*)     All other components within normal limits  URINALYSIS, ROUTINE W REFLEX MICROSCOPIC   No results found.   1. Vision changes       MDM  Patient has had some return of vision over the past 3 weeks. An incomplete MRI done 3 weeks ago shoed NO mass Lesions or intracranial abnormalities.   I have spoken with Dr. Delaney Meigs who has agreed to see the patient at his office immediately, he does not feel that she needs a repeat MRI.   Patient will go immediately to his office.        Arthor Captain, [name] 05/13/12 1633

## 2012-05-13 NOTE — ED Notes (Signed)
AIDET performed. Pt placed on monitor.

## 2012-05-15 NOTE — ED Provider Notes (Signed)
Medical screening examination/treatment/procedure(s) were conducted as a shared visit with non-physician practitioner(s) and myself.  I personally evaluated the patient during the encounter  [name] symptoms are improving since the onset about a month ago.  MRI was done at that time.  No mass lesion noted.  Pt will be seen by Ophthalmology, Dr Delaney Meigs today.  No further imaging necessary at this time.  Celene Kras, MD 05/15/12 [phone]

## 2013-03-04 ENCOUNTER — Institutional Professional Consult (permissible substitution): Payer: 59 | Admitting: Emergency Medicine

## 2013-03-24 ENCOUNTER — Emergency Department (HOSPITAL_COMMUNITY)
Admission: EM | Admit: 2013-03-24 | Discharge: 2013-03-24 | Disposition: A | Discharge status: Home / Self Care | Payer: BC Managed Care – PPO | Attending: Emergency Medicine | Admitting: Emergency Medicine

## 2013-03-24 ENCOUNTER — Encounter (HOSPITAL_COMMUNITY): Payer: Self-pay | Admitting: Emergency Medicine

## 2013-03-24 DIAGNOSIS — I10 Essential (primary) hypertension: Secondary | ICD-10-CM | POA: Insufficient documentation

## 2013-03-24 DIAGNOSIS — Z79899 Other long term (current) drug therapy: Secondary | ICD-10-CM | POA: Insufficient documentation

## 2013-03-24 DIAGNOSIS — F17200 Nicotine dependence, unspecified, uncomplicated: Secondary | ICD-10-CM | POA: Insufficient documentation

## 2013-03-24 DIAGNOSIS — L03039 Cellulitis of unspecified toe: Secondary | ICD-10-CM | POA: Insufficient documentation

## 2013-03-24 DIAGNOSIS — L0390 Cellulitis, unspecified: Secondary | ICD-10-CM

## 2013-03-24 DIAGNOSIS — L02619 Cutaneous abscess of unspecified foot: Secondary | ICD-10-CM | POA: Insufficient documentation

## 2013-03-24 DIAGNOSIS — Z794 Long term (current) use of insulin: Secondary | ICD-10-CM | POA: Insufficient documentation

## 2013-03-24 DIAGNOSIS — E119 Type 2 diabetes mellitus without complications: Secondary | ICD-10-CM | POA: Insufficient documentation

## 2013-03-24 DIAGNOSIS — Z8739 Personal history of other diseases of the musculoskeletal system and connective tissue: Secondary | ICD-10-CM | POA: Insufficient documentation

## 2013-03-24 MED ORDER — OXYCODONE-ACETAMINOPHEN 5-325 MG PO TABS
2.0000 | ORAL_TABLET | Freq: Once | ORAL | Status: AC
  Administered 2013-03-24: 2 via ORAL
  Filled 2013-03-24: qty 2

## 2013-03-24 MED ORDER — DOXYCYCLINE HYCLATE 100 MG PO CAPS: 100.0000 mg | ORAL_CAPSULE | Freq: Two times a day (BID) | ORAL | Status: DC

## 2013-03-24 MED ORDER — OXYCODONE-ACETAMINOPHEN 5-325 MG PO TABS
2.0000 | ORAL_TABLET | ORAL | Status: DC | PRN
Start: 2013-03-24 — End: 2014-02-03

## 2013-03-24 NOTE — Discharge Instructions (Signed)
Cellulitis Cellulitis is an infection of the skin and the tissue beneath it. The infected area is usually red and tender. Cellulitis occurs most often in the arms and lower legs.  CAUSES  Cellulitis is caused by bacteria that enter the skin through cracks or cuts in the skin. The most common types of bacteria that cause cellulitis are Staphylococcus and Streptococcus. SYMPTOMS   Redness and warmth.  Swelling.  Tenderness or pain.  Fever. DIAGNOSIS  Your caregiver can usually determine what is wrong based on a physical exam. Blood tests may also be done. TREATMENT  Treatment usually involves taking an antibiotic medicine. HOME CARE INSTRUCTIONS   Take your antibiotics as directed. Finish them even if you start to feel better.  Keep the infected arm or leg elevated to reduce swelling.  Apply a warm cloth to the affected area up to 4 times per day to relieve pain.  Only take over-the-counter or prescription medicines for pain, discomfort, or fever as directed by your caregiver.  Keep all follow-up appointments as directed by your caregiver. SEEK MEDICAL CARE IF:   You notice red streaks coming from the infected area.  Your red area gets larger or turns dark in color.  Your bone or joint underneath the infected area becomes painful after the skin has healed.  Your infection returns in the same area or another area.  You notice a swollen bump in the infected area.  You develop new symptoms. SEEK IMMEDIATE MEDICAL CARE IF:   You have a fever.  You feel very sleepy.  You develop vomiting or diarrhea.  You have a general ill feeling (malaise) with muscle aches and pains. MAKE SURE YOU:   Understand these instructions.  Will watch your condition.  Will get help right away if you are not doing well or get worse. Document Released: 03/14/2005 Document Revised: 12/04/2011 Document Reviewed: 08/20/2011 Exit[employer] Patient Information 2014 [employer].  

## 2013-03-24 NOTE — ED Provider Notes (Signed)
CSN: [id number]     Arrival date & time 03/24/13  2207 History   First MD Initiated Contact with Patient 03/24/13 2214     Chief Complaint  Patient presents with  . Toe Pain  . Toe infection    (Consider location/radiation/quality/duration/timing/severity/associated sxs/prior Treatment) Patient is a 52 y.o. male presenting with toe pain. The history is provided by the patient and a friend.  Toe Pain   patient here complaining of infection to his fourth toe on his left foot that occurred several days ago. Since that time, he is been using topical antibiotics as well as cleaning the wound with hydrogen peroxide. He has noted increasing redness to the distal end of the toe. No active drainage of pus. Notes moderate pain worse with walking and characterized as sharp. No redness going up his foot or into his legs. No reported fever noted.  Past Medical History  Diagnosis Date  . Diabetes mellitus without complication   . Hypertension   . DDD (degenerative disc disease), lumbar    History reviewed. No pertinent past surgical history. History reviewed. No pertinent family history. History  Substance Use Topics  . Smoking status: Current Every Day Smoker -- 3.00 packs/day    Types: Cigarettes  . Smokeless tobacco: Not on file  . Alcohol Use: No    Review of Systems  All other systems reviewed and are negative.    Allergies  Review of patient's allergies indicates no known allergies.  Home Medications   Current Outpatient Rx  Name  Route  Sig  Dispense  Refill  . hydrALAZINE (APRESOLINE) 25 MG tablet   Oral   Take 25 mg by mouth every 8 (eight) hours.         Marland Kitchen HYDROcodone-acetaminophen (NORCO) 10-325 MG per tablet   Oral   Take 1 tablet by mouth every 6 (six) hours as needed. For pain         . insulin detemir (LEVEMIR) 100 UNIT/ML injection   Subcutaneous   Inject 50 Units into the skin daily.         . isosorbide mononitrate (IMDUR) 15 mg TB24   Oral   Take 0.5  tablets (15 mg total) by mouth daily.   30 tablet   0   . PARoxetine (PAXIL) 40 MG tablet   Oral   Take 40 mg by mouth every morning.         . pregabalin (LYRICA) 300 MG capsule   Oral   Take 300 mg by mouth 2 (two) times daily.         . traZODone (DESYREL) 150 MG tablet   Oral   Take 150 mg by mouth at bedtime.          BP 156/91  Pulse 83  Temp(Src) 98 F (36.7 C) (Oral)  Resp 20  SpO2 94% Physical Exam  Nursing note and vitals reviewed. Constitutional: He is oriented to person, place, and time. He appears well-developed and well-nourished.  Non-toxic appearance. No distress.  HENT:  Head: Normocephalic and atraumatic.  Eyes: Conjunctivae, EOM and lids are normal. Pupils are equal, round, and reactive to light.  Neck: Normal range of motion. Neck supple. No tracheal deviation present. No mass present.  Cardiovascular: Normal rate, regular rhythm and normal heart sounds.  Exam reveals no gallop.   No murmur heard. Pulmonary/Chest: Effort normal and breath sounds normal. No stridor. No respiratory distress. He has no decreased breath sounds. He has no wheezes. He has no rhonchi.  He has no rales.  Abdominal: Soft. Normal appearance and bowel sounds are normal. He exhibits no distension. There is no tenderness. There is no rebound and no CVA tenderness.  Musculoskeletal: Normal range of motion. He exhibits no edema and no tenderness.       Feet:  Neurological: He is alert and oriented to person, place, and time. He has normal strength. No cranial nerve deficit or sensory deficit. GCS eye subscore is 4. GCS verbal subscore is 5. GCS motor subscore is 6.  Skin: Skin is warm and dry. No abrasion and no rash noted.  Psychiatric: He has a normal mood and affect. His speech is normal and behavior is normal.    ED Course  Procedures (including critical care time) Labs Review Labs Reviewed  GLUCOSE, CAPILLARY - Abnormal; Notable for the following:    Glucose-Capillary 208  (*)    All other components within normal limits   Imaging Review No results found.  MDM  No diagnosis found. Patient given Percocet for pain here. His tetanus status is current. He has a mild early cellulitis of the toe without drainable abscess. Will place on doxycycline and he will see his Dr. next week for followup. Return precautions given    Toy Baker, MD 03/24/13 2234

## 2013-03-24 NOTE — ED Notes (Signed)
Pt was moving lawn and cut his 4th toe on the left foot with the lawn mower. States that he has been putting peroxide on it at home, but it keeps getting more and more red.

## 2013-12-07 ENCOUNTER — Ambulatory Visit (INDEPENDENT_AMBULATORY_CARE_PROVIDER_SITE_OTHER): Payer: BC Managed Care – PPO | Admitting: Podiatrist

## 2013-12-07 ENCOUNTER — Encounter: Payer: Self-pay | Admitting: Podiatrist

## 2013-12-07 VITALS — BP 116/69 | HR 104 | Resp 18

## 2013-12-07 DIAGNOSIS — L97409 Non-pressure chronic ulcer of unspecified heel and midfoot with unspecified severity: Secondary | ICD-10-CM

## 2013-12-07 DIAGNOSIS — L97401 Non-pressure chronic ulcer of unspecified heel and midfoot limited to breakdown of skin: Secondary | ICD-10-CM

## 2013-12-07 DIAGNOSIS — E1159 Type 2 diabetes mellitus with other circulatory complications: Secondary | ICD-10-CM

## 2013-12-07 DIAGNOSIS — E1149 Type 2 diabetes mellitus with other diabetic neurological complication: Secondary | ICD-10-CM

## 2013-12-07 MED ORDER — SILVER SULFADIAZINE 1 % EX CREA: 1.0000 "application " | TOPICAL_CREAM | Freq: Every day | CUTANEOUS | Status: DC

## 2013-12-07 MED ORDER — UREA 40 % EX OINT: TOPICAL_OINTMENT | CUTANEOUS | Status: DC | PRN

## 2013-12-07 NOTE — Patient Instructions (Addendum)
Apply silvadene cream to the sores on your feet daily-- cover with a sock.  We are ordering a circulation study-- we will contact you when we get the results of the study.  I am referring you to the diabetes education center at [facility]-- they will contact you with the class schedule and registation.    Diabetes and Foot Care Diabetes may cause you to have problems because of poor blood supply (circulation) to your feet and legs. This may cause the skin on your feet to become thinner, break easier, and heal more slowly. Your skin may become dry, and the skin may peel and crack. You may also have nerve damage in your legs and feet causing decreased feeling in them. You may not notice minor injuries to your feet that could lead to infections or more serious problems. Taking care of your feet is one of the most important things you can do for yourself.  HOME CARE INSTRUCTIONS  Wear shoes at all times, even in the house. Do not go barefoot. Bare feet are easily injured.  Check your feet daily for blisters, cuts, and redness. If you cannot see the bottom of your feet, use a mirror or ask someone for help.  Wash your feet with warm water (do not use hot water) and mild soap. Then pat your feet and the areas between your toes until they are completely dry. Do not soak your feet as this can dry your skin.  Apply a moisturizing lotion or petroleum jelly (that does not contain alcohol and is unscented) to the skin on your feet and to dry, brittle toenails. Do not apply lotion between your toes.  Trim your toenails straight across. Do not dig under them or around the cuticle. File the edges of your nails with an emery board or nail file.  Do not cut corns or calluses or try to remove them with medicine.  Wear clean socks or stockings every day. Make sure they are not too tight. Do not wear knee-high stockings since they may decrease blood flow to your legs.  Wear shoes that fit properly and have  enough cushioning. To break in new shoes, wear them for just a few hours a day. This prevents you from injuring your feet. Always look in your shoes before you put them on to be sure there are no objects inside.  Do not cross your legs. This may decrease the blood flow to your feet.  If you find a minor scrape, cut, or break in the skin on your feet, keep it and the skin around it clean and dry. These areas may be cleansed with mild soap and water. Do not cleanse the area with peroxide, alcohol, or iodine.  When you remove an adhesive bandage, be sure not to damage the skin around it.  If you have a wound, look at it several times a day to make sure it is healing.  Do not use heating pads or hot water bottles. They may burn your skin. If you have lost feeling in your feet or legs, you may not know it is happening until it is too late.  Make sure your health care provider performs a complete foot exam at least annually or more often if you have foot problems. Report any cuts, sores, or bruises to your health care provider immediately. SEEK MEDICAL CARE IF:   You have an injury that is not healing.  You have cuts or breaks in the skin.  You  have an ingrown nail.  You notice redness on your legs or feet.  You feel burning or tingling in your legs or feet.  You have pain or cramps in your legs and feet.  Your legs or feet are numb.  Your feet always feel cold. SEEK IMMEDIATE MEDICAL CARE IF:   There is increasing redness, swelling, or pain in or around a wound.  There is a red line that goes up your leg.  Pus is coming from a wound.  You develop a fever or as directed by your health care provider.  You notice a bad smell coming from an ulcer or wound. Document Released: 06/01/2000 Document Revised: 02/04/2013 Document Reviewed: 11/11/2012 [facility] Patient Information 2015 [place], [place]. This information is not intended to replace advice given to you by your health care  provider. Make sure you discuss any questions you have with your health care provider.

## 2013-12-07 NOTE — Progress Notes (Signed)
   Subjective:    Patient ID: Gregory [name], male    DOB: 20-Jun-1960, 53 y.o.   MRN: [id number]  HPI I am a diabetic and have been for about 7 or 8 years and I do have burning and throbbing and I have neuropathy and I take Lyrica and I take oxycodone 30 mg and I have sores on both my feet and I was in the hospital for 17 days for induced coma and 33 over all and that was last October 2014    Review of Systems  Eyes:       Had a stroke behind the left eye   Endocrine: Positive for cold intolerance.  Genitourinary: Positive for frequency.  Musculoskeletal: Positive for back pain and neck pain.  All other systems reviewed and are negative.      Objective:   Physical Exam Vascular status is decreased with faintly palpable pedal pulses, cool temperature to feet, increased capillary refill time and decreased digital hair growth.  Peripheral neuropathy is noted to bilateral feet with decrease sensation to SWMF at 1/5 sites bilateral.  Light touch and vibration are decreased bilateral, achilles tendon reflex is intact bilateral.  Musculoskeletal exam reveals a high arched foot type with a forefoot cavus noted.  Prominent 5th metatarsal base is also noted bilateral with right being more noticeable.  Dermatological exam reveals multiple ulcerations at sites of pressure on bilateral feet.  They are located Left- submet 1, medial 1st met, distal tip of 5th toe;  On the Right- 5th toe at the pipj, head of the first met and submet 1.  All lesions have a pink central core and no sign of infection present.  They appear to be from self induced picking at the skin or from irritation from shoes.       Assessment & Plan:  Multiple ulcerations bilateral feet  Plan:  Recommended a circulation study to assess circulation to the feet-- also recommended slivadene cream and socks for his feet as well as staying off his feet.  We discussed a diabetic accomidative insert and he will purchase diabetic shoes from [employer]--  I will call with the result of the circulation study and will reassess in 2 weeks. Will decide on vascular referral based on result of testing.  Also referral made for the [place] diabetes education for better glycemic control.  May be a candidate for a wound center referral based on the number of lesions.

## 2013-12-22 ENCOUNTER — Ambulatory Visit: Payer: BC Managed Care – PPO | Admitting: Podiatrist

## 2013-12-25 ENCOUNTER — Other Ambulatory Visit: Payer: Self-pay | Admitting: *Deleted

## 2013-12-25 MED ORDER — UREA 20 % EX CREA: TOPICAL_CREAM | CUTANEOUS | Status: DC | PRN

## 2014-01-12 ENCOUNTER — Telehealth: Payer: Self-pay | Admitting: *Deleted

## 2014-01-12 NOTE — Telephone Encounter (Signed)
CALLED PATIENT AND LEFT MESSAGE FOR HIM TO CALL ME BACK ABOUT HIS MEDICINE

## 2014-01-12 NOTE — Telephone Encounter (Signed)
If the ulcers are not improving, need to send him to the wound center-  i will call in more silvadene cream for him to use in the meantime but he does need a referral so something else can be done for him.

## 2014-01-12 NOTE — Telephone Encounter (Signed)
PATIENT CALLED BACK AND I STATED THAT THE MEDICINE WAS AT HIS PHARMACY AND WE WERE WORKING ON THE WOUND CARE FOR HIM

## 2014-01-13 ENCOUNTER — Other Ambulatory Visit: Payer: Self-pay | Admitting: Podiatrist

## 2014-01-13 NOTE — Telephone Encounter (Signed)
CALLED PATIENT BACK TODAY DUE TO PATIENT STATED THAT THE MEDICINE WAS NOT AT THE PHARMACY AND TO LET PATIENT KNOW THAT I CALLED THE RX TO PHARMACY.Gregory [name]

## 2014-01-15 ENCOUNTER — Telehealth: Payer: Self-pay | Admitting: *Deleted

## 2014-01-15 NOTE — Telephone Encounter (Signed)
I need you to call me.  It's very important.  I have wounds in my feet not healing.  I need to speak to you about a situation.  I have to go to court in [place].  My attorned said to get a note stating I have problems with my feet.  Please give me a call, it' important.

## 2014-01-18 NOTE — Telephone Encounter (Signed)
CALLED AND LEFT A MESSAGE FOR PATIENT TO CALL ME IN [place] TODAY AND I WOULD BE HERE TILL 5 PM AND THEN I WOULD BE IN [place] ON Tuesday AND COULD CALL THE [facility] OFFICE TOMORROW AT [phone]. LISA

## 2014-02-02 ENCOUNTER — Encounter (HOSPITAL_COMMUNITY): Payer: Self-pay | Admitting: Emergency Medicine

## 2014-02-02 ENCOUNTER — Emergency Department (HOSPITAL_COMMUNITY)
Admission: EM | Admit: 2014-02-02 | Discharge: 2014-02-03 | Disposition: A | Discharge status: Home / Self Care | Payer: BC Managed Care – PPO | Attending: Emergency Medicine | Admitting: Emergency Medicine

## 2014-02-02 DIAGNOSIS — L97529 Non-pressure chronic ulcer of other part of left foot with unspecified severity: Secondary | ICD-10-CM

## 2014-02-02 DIAGNOSIS — L97509 Non-pressure chronic ulcer of other part of unspecified foot with unspecified severity: Secondary | ICD-10-CM | POA: Diagnosis not present

## 2014-02-02 DIAGNOSIS — Z8739 Personal history of other diseases of the musculoskeletal system and connective tissue: Secondary | ICD-10-CM | POA: Diagnosis not present

## 2014-02-02 DIAGNOSIS — F17200 Nicotine dependence, unspecified, uncomplicated: Secondary | ICD-10-CM | POA: Insufficient documentation

## 2014-02-02 DIAGNOSIS — L988 Other specified disorders of the skin and subcutaneous tissue: Secondary | ICD-10-CM | POA: Diagnosis present

## 2014-02-02 DIAGNOSIS — L97519 Non-pressure chronic ulcer of other part of right foot with unspecified severity: Secondary | ICD-10-CM

## 2014-02-02 DIAGNOSIS — Z79899 Other long term (current) drug therapy: Secondary | ICD-10-CM | POA: Diagnosis not present

## 2014-02-02 DIAGNOSIS — I10 Essential (primary) hypertension: Secondary | ICD-10-CM | POA: Insufficient documentation

## 2014-02-02 DIAGNOSIS — E11621 Type 2 diabetes mellitus with foot ulcer: Secondary | ICD-10-CM

## 2014-02-02 DIAGNOSIS — Z794 Long term (current) use of insulin: Secondary | ICD-10-CM | POA: Insufficient documentation

## 2014-02-02 DIAGNOSIS — E1069 Type 1 diabetes mellitus with other specified complication: Secondary | ICD-10-CM | POA: Insufficient documentation

## 2014-02-02 NOTE — ED Notes (Signed)
Pt states he is diabetic and has blisters coming up on his feet  Wife states they look like they may be getting infected  Pt states they are painful  Pt states he has had this going off and on for the past 6 mths to a year

## 2014-02-03 LAB — CBC WITH DIFFERENTIAL/PLATELET
Basophils Absolute: 0 10*3/uL (ref 0.0–0.1)
Basophils Relative: 0 % (ref 0–1)
Eosinophils Absolute: 0.3 10*3/uL (ref 0.0–0.7)
Eosinophils Relative: 3 % (ref 0–5)
HCT: 36.1 % — ABNORMAL LOW (ref 39.0–52.0)
Hemoglobin: 12 g/dL — ABNORMAL LOW (ref 13.0–17.0)
Lymphocytes Relative: 21 % (ref 12–46)
Lymphs Abs: 2.2 10*3/uL (ref 0.7–4.0)
MCH: 29.3 pg (ref 26.0–34.0)
MCHC: 33.2 g/dL (ref 30.0–36.0)
MCV: 88 fL (ref 78.0–100.0)
Monocytes Absolute: 0.8 10*3/uL (ref 0.1–1.0)
Monocytes Relative: 7 % (ref 3–12)
Neutro Abs: 7.2 10*3/uL (ref 1.7–7.7)
Neutrophils Relative %: 69 % (ref 43–77)
Platelets: 221 10*3/uL (ref 150–400)
RBC: 4.1 MIL/uL — ABNORMAL LOW (ref 4.22–5.81)
RDW: 14.5 % (ref 11.5–15.5)
WBC: 10.5 10*3/uL (ref 4.0–10.5)

## 2014-02-03 LAB — URINALYSIS, ROUTINE W REFLEX MICROSCOPIC
Bilirubin Urine: NEGATIVE
Glucose, UA: NEGATIVE mg/dL
Hgb urine dipstick: NEGATIVE
Ketones, ur: NEGATIVE mg/dL
Leukocytes, UA: NEGATIVE
Nitrite: NEGATIVE
Protein, ur: NEGATIVE mg/dL
Specific Gravity, Urine: 1.012 (ref 1.005–1.030)
Urobilinogen, UA: 1 mg/dL (ref 0.0–1.0)
pH: 6.5 (ref 5.0–8.0)

## 2014-02-03 LAB — BASIC METABOLIC PANEL
Anion gap: 15 (ref 5–15)
BUN: 12 mg/dL (ref 6–23)
CO2: 32 mEq/L (ref 19–32)
Calcium: 8.6 mg/dL (ref 8.4–10.5)
Chloride: 95 mEq/L — ABNORMAL LOW (ref 96–112)
Creatinine, Ser: 0.98 mg/dL (ref 0.50–1.35)
GFR calc Af Amer: >90 mL/min (ref >90)
GFR calc non Af Amer: >90 mL/min (ref >90)
Glucose, Bld: 105 mg/dL — ABNORMAL HIGH (ref 70–99)
Potassium: 3.3 mEq/L — ABNORMAL LOW (ref 3.7–5.3)
Sodium: 142 mEq/L (ref 137–147)

## 2014-02-03 MED ORDER — MORPHINE SULFATE 4 MG/ML IJ SOLN
6.0000 mg | Freq: Once | INTRAMUSCULAR | Status: AC
  Administered 2014-02-03: 6 mg via INTRAMUSCULAR
  Filled 2014-02-03: qty 2

## 2014-02-03 MED ORDER — SULFAMETHOXAZOLE-TRIMETHOPRIM 800-160 MG PO TABS: 2.0000 | ORAL_TABLET | Freq: Two times a day (BID) | ORAL | Status: DC

## 2014-02-03 MED ORDER — OXYCODONE-ACETAMINOPHEN 5-325 MG PO TABS: 1.0000 | ORAL_TABLET | Freq: Four times a day (QID) | ORAL | Status: DC | PRN

## 2014-02-03 MED ORDER — AMOXICILLIN-POT CLAVULANATE 875-125 MG PO TABS: 1.0000 | ORAL_TABLET | Freq: Two times a day (BID) | ORAL | Status: DC

## 2014-02-03 NOTE — ED Provider Notes (Signed)
CSN: [id number]     Arrival date & time 02/02/14  2345 History   First MD Initiated Contact with Patient 02/03/14 0000     Chief Complaint  Patient presents with  . blisters      (Consider location/radiation/quality/duration/timing/severity/associated sxs/prior Treatment) HPI Patient presents to the emergency department with blisters on his feet.  The patient, states he didn't been occurring off, and on for over a year.  The patient, states, that these blistered areas of warmth and then rupture and leave a sore on his foot.  Patient, states, that he has not followed up with anybody on these.  The patient denies chest pain, shortness of breath, headache, blurred vision, weakness, numbness, dizziness, back pain, nausea, vomiting, diarrhea, fever dysuria urinary frequency, or syncope.  Patient, states, that he did not take any medications other than his normal prescribed medications.  Patient, states nothing make his condition, better or worse Past Medical History  Diagnosis Date  . Diabetes mellitus without complication   . Hypertension   . DDD (degenerative disc disease), lumbar    History reviewed. No pertinent past surgical history. Family History  Problem Relation Age of Onset  . Cancer Other   . Diabetes Other    History  Substance Use Topics  . Smoking status: Current Every Day Smoker -- 3.00 packs/day    Types: Cigarettes  . Smokeless tobacco: Not on file  . Alcohol Use: No    Review of Systems   All other systems negative except as documented in the HPI. All pertinent positives and negatives as reviewed in the HPI.  Allergies  Review of patient's allergies indicates no known allergies.  Home Medications   Prior to Admission medications   Medication Sig Start Date End Date Taking? Authorizing Provider  acetaminophen (TYLENOL) 500 MG tablet Take 1,500 mg by mouth every 6 (six) hours as needed for headache.   Yes Historical Provider, MD  bisoprolol-hydrochlorothiazide  (ZIAC) 5-6.25 MG per tablet Take 1 tablet by mouth daily.  11/09/13  Yes Historical Provider, MD  furosemide (LASIX) 40 MG tablet Take 40 mg by mouth daily.  11/07/13  Yes Historical Provider, MD  glimepiride (AMARYL) 4 MG tablet Take 1 tablet by mouth daily. 12/09/13  Yes Historical Provider, MD  glipiZIDE (GLUCOTROL) 10 MG tablet Take 1 tablet by mouth 2 (two) times daily. 01/12/14  Yes Historical Provider, MD  ibuprofen (ADVIL,MOTRIN) 200 MG tablet Take 400 mg by mouth every 6 (six) hours as needed for moderate pain.   Yes Historical Provider, MD  insulin detemir (LEVEMIR) 100 UNIT/ML injection Inject 50 Units into the skin daily.   Yes Historical Provider, MD  isosorbide mononitrate (IMDUR) 15 mg TB24 Take 0.5 tablets (15 mg total) by mouth daily. 04/17/12  Yes [name], MD  metFORMIN (GLUCOPHAGE) 1000 MG tablet Take 1,000 mg by mouth 2 (two) times daily with a meal.  12/02/13  Yes Historical Provider, MD  mupirocin ointment (BACTROBAN) 2 % Apply 1 application topically 2 (two) times daily.  11/26/13  Yes Historical Provider, MD  NOVOLOG 100 UNIT/ML injection Inject 10 Units into the skin 2 (two) times daily.  11/23/13  Yes Historical Provider, MD  omeprazole (PRILOSEC) 20 MG capsule Take 20 mg by mouth daily.  11/09/13  Yes Historical Provider, MD  oxycodone (ROXICODONE) 30 MG immediate release tablet Take 30 mg by mouth 4 (four) times daily as needed for pain.  11/26/13  Yes Historical Provider, MD  PARoxetine (PAXIL) 40 MG tablet Take 40  mg by mouth every morning.   Yes Historical Provider, MD  pregabalin (LYRICA) 300 MG capsule Take 300 mg by mouth 2 (two) times daily.   Yes Historical Provider, MD  promethazine (PHENERGAN) 25 MG tablet Take 25 mg by mouth every 4 (four) hours as needed for nausea.  10/29/13  Yes Historical Provider, MD  silver sulfADIAZINE (SILVADENE) 1 % cream Apply 1 application topically daily. 12/07/13  Yes [name], [name]  tamsulosin (FLOMAX) 0.4 MG CAPS capsule Take  0.4 mg by mouth daily after supper.  11/26/13  Yes Historical Provider, MD  tobramycin-dexamethasone [facility]) ophthalmic solution Place 1 drop into the left eye 2 (two) times daily.  11/30/13  Yes Historical Provider, MD  traZODone (DESYREL) 150 MG tablet Take 150 mg by mouth at bedtime.   Yes Historical Provider, MD  urea (UREACIN-20) 20 % cream Apply topically as needed. 12/25/13  Yes Viona Gilmore Egerton, [name]  VIAGRA 100 MG tablet Take 100 mg by mouth as needed for erectile dysfunction.  11/16/13  Yes Historical Provider, MD  VICTOZA 18 MG/3ML SOPN Inject 6 mLs into the skin 2 (two) times daily.  11/23/13  Yes Historical Provider, MD   BP 115/71  Pulse 79  Temp(Src) 97.8 F (36.6 C) (Oral)  Resp 20  SpO2 94% Physical Exam  Constitutional: He is oriented to person, place, and time. He appears well-developed and well-nourished. No distress.  HENT:  Head: Normocephalic and atraumatic.  Mouth/Throat: Oropharynx is clear and moist.  Eyes: Pupils are equal, round, and reactive to light.  Neck: Normal range of motion. Neck supple.  Cardiovascular: Normal rate, regular rhythm and normal heart sounds.  Exam reveals no gallop and no friction rub.   No murmur heard. Pulmonary/Chest: Effort normal and breath sounds normal. No respiratory distress.  Musculoskeletal:       Feet:  Neurological: He is alert and oriented to person, place, and time.  Skin: Skin is warm and dry. No erythema.    ED Course  Procedures (including critical care time) Labs Review Labs Reviewed  CBC WITH DIFFERENTIAL - Abnormal; Notable for the following:    RBC 4.10 (*)    Hemoglobin 12.0 (*)    HCT 36.1 (*)    All other components within normal limits  BASIC METABOLIC PANEL - Abnormal; Notable for the following:    Potassium 3.3 (*)    Chloride 95 (*)    Glucose, Bld 105 (*)    All other components within normal limits  URINALYSIS, ROUTINE W REFLEX MICROSCOPIC   The patient will be referred to the wound care  center.  Told to return here as needed.  Patient be given by mouth antibiotics and told to followup with his primary care Dr. as well     Brent General, [name] 02/03/14 [phone]

## 2014-02-03 NOTE — ED Provider Notes (Signed)
Medical screening examination/treatment/procedure(s) were performed by non-physician practitioner and as supervising physician I was immediately available for consultation/collaboration.   EKG Interpretation None        Julianne Rice, MD 02/03/14 0600

## 2014-02-03 NOTE — Discharge Instructions (Signed)
REturn here as needed.Follow up with the wound center and your Primary Care

## 2014-02-26 ENCOUNTER — Ambulatory Visit (HOSPITAL_BASED_OUTPATIENT_CLINIC_OR_DEPARTMENT_OTHER): Payer: 59

## 2014-03-08 ENCOUNTER — Encounter (HOSPITAL_BASED_OUTPATIENT_CLINIC_OR_DEPARTMENT_OTHER): Payer: BC Managed Care – PPO

## 2014-05-14 ENCOUNTER — Encounter (HOSPITAL_COMMUNITY): Payer: Self-pay | Admitting: Emergency Medicine

## 2014-05-14 ENCOUNTER — Emergency Department (HOSPITAL_COMMUNITY)
Admission: EM | Admit: 2014-05-14 | Discharge: 2014-05-15 | Disposition: A | Discharge status: Home / Self Care | Payer: BC Managed Care – PPO | Attending: Emergency Medicine | Admitting: Emergency Medicine

## 2014-05-14 DIAGNOSIS — Z794 Long term (current) use of insulin: Secondary | ICD-10-CM | POA: Insufficient documentation

## 2014-05-14 DIAGNOSIS — L97529 Non-pressure chronic ulcer of other part of left foot with unspecified severity: Secondary | ICD-10-CM | POA: Insufficient documentation

## 2014-05-14 DIAGNOSIS — L02612 Cutaneous abscess of left foot: Secondary | ICD-10-CM | POA: Insufficient documentation

## 2014-05-14 DIAGNOSIS — L97519 Non-pressure chronic ulcer of other part of right foot with unspecified severity: Secondary | ICD-10-CM | POA: Insufficient documentation

## 2014-05-14 DIAGNOSIS — E08621 Diabetes mellitus due to underlying condition with foot ulcer: Secondary | ICD-10-CM | POA: Diagnosis not present

## 2014-05-14 DIAGNOSIS — Z4800 Encounter for change or removal of nonsurgical wound dressing: Secondary | ICD-10-CM | POA: Diagnosis present

## 2014-05-14 DIAGNOSIS — I10 Essential (primary) hypertension: Secondary | ICD-10-CM | POA: Diagnosis not present

## 2014-05-14 DIAGNOSIS — Z7952 Long term (current) use of systemic steroids: Secondary | ICD-10-CM | POA: Diagnosis not present

## 2014-05-14 DIAGNOSIS — Z79899 Other long term (current) drug therapy: Secondary | ICD-10-CM | POA: Insufficient documentation

## 2014-05-14 DIAGNOSIS — Z8739 Personal history of other diseases of the musculoskeletal system and connective tissue: Secondary | ICD-10-CM | POA: Diagnosis not present

## 2014-05-14 DIAGNOSIS — L02611 Cutaneous abscess of right foot: Secondary | ICD-10-CM | POA: Insufficient documentation

## 2014-05-14 DIAGNOSIS — Z720 Tobacco use: Secondary | ICD-10-CM | POA: Diagnosis not present

## 2014-05-14 DIAGNOSIS — Z792 Long term (current) use of antibiotics: Secondary | ICD-10-CM | POA: Diagnosis not present

## 2014-05-14 DIAGNOSIS — IMO0002 Reserved for concepts with insufficient information to code with codable children: Secondary | ICD-10-CM

## 2014-05-14 LAB — CBG MONITORING, ED: Glucose-Capillary: 238 mg/dL — ABNORMAL HIGH (ref 70–99)

## 2014-05-14 NOTE — ED Notes (Addendum)
Pt brought to ED by his girlfriend after checking his feet, pt c/o pain to bilat feet, worse R. Wounds noted to bilat feet, worse R. No drainage noted.  Pt states he has taken Roxicet 30mg  without relief.

## 2014-05-15 ENCOUNTER — Emergency Department (HOSPITAL_COMMUNITY): Payer: BC Managed Care – PPO

## 2014-05-15 MED ORDER — HYDROCODONE-ACETAMINOPHEN 5-325 MG PO TABS
2.0000 | ORAL_TABLET | Freq: Once | ORAL | Status: AC
  Administered 2014-05-15: 2 via ORAL
  Filled 2014-05-15: qty 2

## 2014-05-15 MED ORDER — SULFAMETHOXAZOLE-TRIMETHOPRIM 800-160 MG PO TABS
1.0000 | ORAL_TABLET | Freq: Once | ORAL | Status: AC
  Administered 2014-05-15: 1 via ORAL
  Filled 2014-05-15: qty 1

## 2014-05-15 MED ORDER — SULFAMETHOXAZOLE-TRIMETHOPRIM 800-160 MG PO TABS: 1.0000 | ORAL_TABLET | Freq: Two times a day (BID) | ORAL | Status: DC

## 2014-05-15 NOTE — ED Provider Notes (Signed)
CSN: [id number]     Arrival date & time 05/14/14  2205 History   First MD Initiated Contact with Patient 05/14/14 2343     Chief Complaint  Patient presents with  . Wound Check     (Consider location/radiation/quality/duration/timing/severity/associated sxs/prior Treatment) HPI Comments:  The patient is a 53 year old male who has diabetes, he admittedly eats whatever he wants and though he uses his medications his blood sugar trends up and down over a week as high as 300, as low as 55. He states that he has had several months of worsening ulcers on his feet especially the right foot for which she has been seen by his family doctor, Dr. Rex Kras as well as by a podiatrist. The patient and his significant other are not happy with the care that they have had, they want a second opinion. The symptoms are persistent, gradually worsening, not associated with fevers chills nausea or vomiting. He has increased pain when he walks. He denies any discharge from the wound and no foul smell  Patient is a 53 y.o. male presenting with wound check. The history is provided by the patient.  Wound Check    Past Medical History  Diagnosis Date  . Diabetes mellitus without complication   . Hypertension   . DDD (degenerative disc disease), lumbar    No past surgical history on file. Family History  Problem Relation Age of Onset  . Cancer Other   . Diabetes Other    History  Substance Use Topics  . Smoking status: Current Every Day Smoker -- 3.00 packs/day    Types: Cigarettes  . Smokeless tobacco: Not on file  . Alcohol Use: No    Review of Systems  All other systems reviewed and are negative.     Allergies  Review of patient's allergies indicates no known allergies.  Home Medications   Prior to Admission medications   Medication Sig Start Date End Date Taking? Authorizing Provider  acetaminophen (TYLENOL) 500 MG tablet Take 1,500 mg by mouth every 6 (six) hours as needed for headache.    Yes Historical Provider, MD  bisoprolol-hydrochlorothiazide (ZIAC) 5-6.25 MG per tablet Take 1 tablet by mouth daily.  11/09/13  Yes Historical Provider, MD  furosemide (LASIX) 40 MG tablet Take 40 mg by mouth daily.  11/07/13  Yes Historical Provider, MD  glimepiride (AMARYL) 4 MG tablet Take 1 tablet by mouth daily. 12/09/13  Yes Historical Provider, MD  glipiZIDE (GLUCOTROL) 10 MG tablet Take 1 tablet by mouth 2 (two) times daily. 01/12/14  Yes Historical Provider, MD  ibuprofen (ADVIL,MOTRIN) 200 MG tablet Take 400 mg by mouth every 6 (six) hours as needed for moderate pain.   Yes Historical Provider, MD  insulin aspart protamine- aspart (NOVOLOG MIX 70/30) (70-30) 100 UNIT/ML injection Inject 50 Units into the skin daily.   Yes Historical Provider, MD  isosorbide mononitrate (IMDUR) 15 mg TB24 Take 0.5 tablets (15 mg total) by mouth daily. 04/17/12  Yes [name], MD  metFORMIN (GLUCOPHAGE) 1000 MG tablet Take 1,000 mg by mouth 2 (two) times daily with a meal.  12/02/13  Yes Historical Provider, MD  NOVOLOG 100 UNIT/ML injection Inject 10 Units into the skin 2 (two) times daily.  11/23/13  Yes Historical Provider, MD  omeprazole (PRILOSEC) 20 MG capsule Take 20 mg by mouth daily.  11/09/13  Yes Historical Provider, MD  oxycodone (ROXICODONE) 30 MG immediate release tablet Take 30 mg by mouth 4 (four) times daily as needed for pain.  11/26/13  Yes Historical Provider, MD  PARoxetine (PAXIL) 40 MG tablet Take 40 mg by mouth every morning.   Yes Historical Provider, MD  pregabalin (LYRICA) 300 MG capsule Take 300 mg by mouth 2 (two) times daily.   Yes Historical Provider, MD  tamsulosin (FLOMAX) 0.4 MG CAPS capsule Take 0.4 mg by mouth daily after supper.  11/26/13  Yes Historical Provider, MD  traZODone (DESYREL) 150 MG tablet Take 150 mg by mouth at bedtime.   Yes Historical Provider, MD  VIAGRA 100 MG tablet Take 100 mg by mouth as needed for erectile dysfunction.  11/16/13  Yes Historical Provider,  MD  VICTOZA 18 MG/3ML SOPN Inject 6 mLs into the skin 2 (two) times daily.  11/23/13  Yes Historical Provider, MD  amoxicillin-clavulanate (AUGMENTIN) 875-125 MG per tablet Take 1 tablet by mouth every 12 (twelve) hours. 02/03/14   [place], [name]  insulin detemir (LEVEMIR) 100 UNIT/ML injection Inject 50 Units into the skin daily.    Historical Provider, MD  mupirocin ointment (BACTROBAN) 2 % Apply 1 application topically 2 (two) times daily.  11/26/13   Historical Provider, MD  oxyCODONE-acetaminophen (PERCOCET/ROXICET) 5-325 MG per tablet Take 1 tablet by mouth every 6 (six) hours as needed for severe pain. 02/03/14   Resa Miner Lawyer, [name]  promethazine (PHENERGAN) 25 MG tablet Take 25 mg by mouth every 4 (four) hours as needed for nausea.  10/29/13   Historical Provider, MD  silver sulfADIAZINE (SILVADENE) 1 % cream Apply 1 application topically daily. 12/07/13   [name], [name]  sulfamethoxazole-trimethoprim (SEPTRA DS) 800-160 MG per tablet Take 1 tablet by mouth every 12 (twelve) hours. 05/15/14   Johnna Acosta, MD  tobramycin-dexamethasone [facility]) ophthalmic solution Place 1 drop into the left eye 2 (two) times daily.  11/30/13   Historical Provider, MD  urea (UREACIN-20) 20 % cream Apply topically as needed. 12/25/13   [name], [name]   BP 126/74 mmHg  Pulse 93  Temp(Src) 98.3 F (36.8 C) (Oral)  Resp 16  Ht 6' (1.829 m)  Wt 250 lb (113.399 kg)  BMI 33.90 kg/m2  SpO2 96% Physical Exam  Constitutional: He appears well-developed and well-nourished. No distress.  HENT:  Head: Normocephalic and atraumatic.  Mouth/Throat: Oropharynx is clear and moist. No oropharyngeal exudate.  Eyes: Conjunctivae and EOM are normal. Pupils are equal, round, and reactive to light. Right eye exhibits no discharge. Left eye exhibits no discharge. No scleral icterus.  Neck: Normal range of motion. Neck supple. No JVD present. No thyromegaly present.  Cardiovascular: Intact distal  pulses.   Pulmonary/Chest: Effort normal. No respiratory distress.  Musculoskeletal: Normal range of motion. He exhibits tenderness ( Tender to palpation over the diabetic foot ulcers of the right foot). He exhibits no edema.  Lymphadenopathy:    He has no cervical adenopathy.  Neurological: He is alert. Coordination normal.  Skin: Skin is warm and dry.  Multiple small to medium-sized ulcers of the right foot, there is no exposed bone, no foul-smelling drainage, no purulence, no bleeding. See pictures below  Psychiatric: He has a normal mood and affect. His behavior is normal.  Nursing note and vitals reviewed.   ED Course  Procedures (including critical care time) Labs Review Labs Reviewed  CBG MONITORING, ED - Abnormal; Notable for the following:    Glucose-Capillary 238 (*)    All other components within normal limits    Imaging Review Dg Foot Complete Right  05/15/2014   CLINICAL  DATA:  53 year old male diabetic with right foot blisters. Evaluate for osteomyelitis.  EXAM: RIGHT FOOT COMPLETE - 3+ VIEW  COMPARISON:  None  FINDINGS: No conventional radiographic evidence of osteomyelitis. No acute fracture or malalignment. Normal bony mineralization. No lytic of blastic osseous lesion. Soft tissue swelling and irregularity at the ball of the foot consistent with the clinical history of blister/ulcer. No retained radiopaque foreign body.  IMPRESSION: No conventional radiographic evidence of osteomyelitis.   Electronically Signed   By: Jacqulynn Cadet M.D.   On: 05/15/2014 01:20     EKG Interpretation None      MDM   Final diagnoses:  Wound abscess  Diabetic ulcer of both feet associated with diabetes mellitus due to underlying condition    X-ray to rule out osteomyelitis though doubt, pain medications, referral to wound care center. Strict diabetic control this point to the patient, they request an endocrinology consultation as an outpatient. Information will be  provided.      Vital signs reassuring, no signs of osteomyelitis on x-ray, can follow up as outpatient with endocrinology, wound care and family doctor  Already seeing podiatry   Meds given in ED:  Medications  sulfamethoxazole-trimethoprim (BACTRIM DS,SEPTRA DS) 800-160 MG per tablet 1 tablet (1 tablet Oral Given 05/15/14 0026)  HYDROcodone-acetaminophen (NORCO/VICODIN) 5-325 MG per tablet 2 tablet (2 tablets Oral Given 05/15/14 0026)    New Prescriptions   SULFAMETHOXAZOLE-TRIMETHOPRIM (SEPTRA DS) 800-160 MG PER TABLET    Take 1 tablet by mouth every 12 (twelve) hours.      Johnna Acosta, MD 05/15/14 [phone]

## 2014-05-15 NOTE — Discharge Instructions (Signed)
Strict blood sugar control is very important to help your wounds heal, have your doctor refer you to a wound care center but you should also continue to see your podiatrist as they will help improve your wound healing of your foot. Stop smoking.  Please call your doctor for a followup appointment within 24-48 hours. When you talk to your doctor please let them know that you were seen in the emergency department and have them acquire all of your records so that they can discuss the findings with you and formulate a treatment plan to fully care for your new and ongoing problems.

## 2014-06-02 ENCOUNTER — Encounter (HOSPITAL_BASED_OUTPATIENT_CLINIC_OR_DEPARTMENT_OTHER): Payer: BC Managed Care – PPO | Attending: General Surgery

## 2014-06-02 DIAGNOSIS — E11621 Type 2 diabetes mellitus with foot ulcer: Secondary | ICD-10-CM | POA: Insufficient documentation

## 2014-06-02 DIAGNOSIS — Z794 Long term (current) use of insulin: Secondary | ICD-10-CM | POA: Insufficient documentation

## 2014-06-02 DIAGNOSIS — L97411 Non-pressure chronic ulcer of right heel and midfoot limited to breakdown of skin: Secondary | ICD-10-CM | POA: Insufficient documentation

## 2014-06-09 DIAGNOSIS — Z794 Long term (current) use of insulin: Secondary | ICD-10-CM | POA: Diagnosis not present

## 2014-06-09 DIAGNOSIS — E11621 Type 2 diabetes mellitus with foot ulcer: Secondary | ICD-10-CM | POA: Diagnosis present

## 2014-06-09 DIAGNOSIS — L97411 Non-pressure chronic ulcer of right heel and midfoot limited to breakdown of skin: Secondary | ICD-10-CM | POA: Diagnosis not present

## 2014-06-10 NOTE — Progress Notes (Signed)
[facility]  NAME:  TSUNEO, FAISON                       ACCOUNT NO.:  MEDICAL RECORD NO.:  [id number]      DATE OF BIRTH:  1960/06/29  PHYSICIAN:  Judene Companion, M.D.           VISIT DATE:                                  OFFICE VISIT   Gregory [name] is a 53 year old diabetic who comes to [place] because of plantar ulcers on his right foot, most notably, over the first MP joint and the fifth MP joint.  He also has a plantar ulcer on his great toe.  This man is on many medicines including he is on Augmentin for this problem.  He takes the 875 every 12 hours.  He also has been using Silvadene cream and he takes metformin and NovoLog.  He also takes Paxil and Lyrica and Glucotrol.  When he came here today, he was found to have a blood pressure of 107/71, respirations 18, pulse 87, temperature 97.  He weighs 255 pounds and he is 6 feet tall.  On looking at these ulcers, they are ranked as a Wagner 2, on all 3 of them.  The largest are about a cm and a half over his first MP joint.  I am going to treat him with the collagen and we put him on to apply for a Dermagraft which we can do later on but for today, we put on collagen and I am offloading him with these Felt protectors that are around all of the ulcers.  So his diagnosis is type 2 diabetes, plantar ulcers on his right foot, hypertension.     Judene Companion, M.D.     PP/MEDQ  D:  06/09/2014  T:  06/09/2014  Job:  [id number]

## 2014-06-16 DIAGNOSIS — L97411 Non-pressure chronic ulcer of right heel and midfoot limited to breakdown of skin: Secondary | ICD-10-CM | POA: Diagnosis not present

## 2014-06-16 DIAGNOSIS — E11621 Type 2 diabetes mellitus with foot ulcer: Secondary | ICD-10-CM | POA: Diagnosis not present

## 2014-06-16 DIAGNOSIS — Z794 Long term (current) use of insulin: Secondary | ICD-10-CM | POA: Diagnosis not present

## 2014-06-23 ENCOUNTER — Encounter (HOSPITAL_BASED_OUTPATIENT_CLINIC_OR_DEPARTMENT_OTHER): Payer: BLUE CROSS/BLUE SHIELD | Attending: General Surgery

## 2014-06-23 DIAGNOSIS — L97519 Non-pressure chronic ulcer of other part of right foot with unspecified severity: Secondary | ICD-10-CM | POA: Insufficient documentation

## 2014-06-23 DIAGNOSIS — E11621 Type 2 diabetes mellitus with foot ulcer: Secondary | ICD-10-CM | POA: Insufficient documentation

## 2014-06-30 ENCOUNTER — Other Ambulatory Visit: Payer: Self-pay | Admitting: Neurosurgery

## 2014-06-30 DIAGNOSIS — E11621 Type 2 diabetes mellitus with foot ulcer: Secondary | ICD-10-CM | POA: Diagnosis present

## 2014-06-30 DIAGNOSIS — M5412 Radiculopathy, cervical region: Secondary | ICD-10-CM

## 2014-06-30 DIAGNOSIS — M545 Low back pain: Secondary | ICD-10-CM

## 2014-06-30 DIAGNOSIS — L97519 Non-pressure chronic ulcer of other part of right foot with unspecified severity: Secondary | ICD-10-CM | POA: Diagnosis not present

## 2014-07-04 ENCOUNTER — Inpatient Hospital Stay: Admission: RE | Admit: 2014-07-04 | Payer: 59 | Source: Ambulatory Visit

## 2014-07-04 ENCOUNTER — Other Ambulatory Visit: Payer: 59

## 2014-07-17 ENCOUNTER — Ambulatory Visit
Admission: RE | Admit: 2014-07-17 | Discharge: 2014-07-17 | Disposition: A | Discharge status: Home / Self Care | Payer: BLUE CROSS/BLUE SHIELD | Source: Ambulatory Visit | Attending: Neurosurgery | Admitting: Neurosurgery

## 2014-07-17 DIAGNOSIS — M545 Low back pain: Secondary | ICD-10-CM

## 2014-07-17 DIAGNOSIS — M5412 Radiculopathy, cervical region: Secondary | ICD-10-CM

## 2014-07-23 ENCOUNTER — Encounter (HOSPITAL_BASED_OUTPATIENT_CLINIC_OR_DEPARTMENT_OTHER): Payer: BLUE CROSS/BLUE SHIELD | Attending: Internal Medicine

## 2014-07-23 DIAGNOSIS — E1140 Type 2 diabetes mellitus with diabetic neuropathy, unspecified: Secondary | ICD-10-CM | POA: Diagnosis present

## 2014-07-23 DIAGNOSIS — E08621 Diabetes mellitus due to underlying condition with foot ulcer: Secondary | ICD-10-CM | POA: Diagnosis not present

## 2014-07-23 DIAGNOSIS — L97521 Non-pressure chronic ulcer of other part of left foot limited to breakdown of skin: Secondary | ICD-10-CM | POA: Insufficient documentation

## 2014-08-03 ENCOUNTER — Other Ambulatory Visit (HOSPITAL_COMMUNITY): Payer: Self-pay | Admitting: Neurosurgery

## 2014-08-26 ENCOUNTER — Inpatient Hospital Stay (HOSPITAL_COMMUNITY): Admission: RE | Admit: 2014-08-26 | Payer: BLUE CROSS/BLUE SHIELD | Source: Ambulatory Visit

## 2014-09-02 ENCOUNTER — Inpatient Hospital Stay: Admission: RE | Admit: 2014-09-02 | Payer: BLUE CROSS/BLUE SHIELD | Source: Ambulatory Visit | Admitting: Neurosurgery

## 2014-09-02 ENCOUNTER — Encounter: Admission: RE | Payer: Self-pay | Source: Ambulatory Visit

## 2014-09-02 SURGERY — POSTERIOR LUMBAR FUSION 2 LEVEL
Anesthesia: General

## 2014-09-23 ENCOUNTER — Encounter (HOSPITAL_BASED_OUTPATIENT_CLINIC_OR_DEPARTMENT_OTHER): Payer: BLUE CROSS/BLUE SHIELD | Attending: Internal Medicine

## 2014-11-12 NOTE — Telephone Encounter (Signed)
[facility] SENT REFILL FOR RX SILVADENE 1% TOPICAL CREAM GM #[phone] OK BY DR. Valentina Lucks.

## 2015-08-13 DIAGNOSIS — B955 Unspecified streptococcus as the cause of diseases classified elsewhere: Secondary | ICD-10-CM | POA: Insufficient documentation

## 2015-08-13 DIAGNOSIS — B9562 Methicillin resistant Staphylococcus aureus infection as the cause of diseases classified elsewhere: Secondary | ICD-10-CM | POA: Insufficient documentation

## 2015-08-13 DIAGNOSIS — G8929 Other chronic pain: Secondary | ICD-10-CM | POA: Insufficient documentation

## 2015-08-13 DIAGNOSIS — M545 Low back pain, unspecified: Secondary | ICD-10-CM | POA: Insufficient documentation

## 2015-08-13 DIAGNOSIS — J189 Pneumonia, unspecified organism: Secondary | ICD-10-CM | POA: Insufficient documentation

## 2015-08-13 DIAGNOSIS — M8639 Chronic multifocal osteomyelitis, multiple sites: Secondary | ICD-10-CM | POA: Insufficient documentation

## 2015-08-30 ENCOUNTER — Encounter (HOSPITAL_COMMUNITY): Payer: Self-pay | Admitting: Emergency Medicine

## 2015-08-30 ENCOUNTER — Emergency Department (HOSPITAL_COMMUNITY)
Admission: EM | Admit: 2015-08-30 | Discharge: 2015-08-30 | Disposition: A | Discharge status: Home / Self Care | Payer: Medicaid Other | Attending: Emergency Medicine | Admitting: Emergency Medicine

## 2015-08-30 DIAGNOSIS — Z8739 Personal history of other diseases of the musculoskeletal system and connective tissue: Secondary | ICD-10-CM | POA: Insufficient documentation

## 2015-08-30 DIAGNOSIS — E118 Type 2 diabetes mellitus with unspecified complications: Secondary | ICD-10-CM | POA: Diagnosis not present

## 2015-08-30 DIAGNOSIS — R52 Pain, unspecified: Secondary | ICD-10-CM | POA: Diagnosis present

## 2015-08-30 DIAGNOSIS — L97519 Non-pressure chronic ulcer of other part of right foot with unspecified severity: Secondary | ICD-10-CM | POA: Insufficient documentation

## 2015-08-30 DIAGNOSIS — Z7984 Long term (current) use of oral hypoglycemic drugs: Secondary | ICD-10-CM | POA: Insufficient documentation

## 2015-08-30 DIAGNOSIS — Z794 Long term (current) use of insulin: Secondary | ICD-10-CM | POA: Diagnosis not present

## 2015-08-30 DIAGNOSIS — L309 Dermatitis, unspecified: Secondary | ICD-10-CM | POA: Insufficient documentation

## 2015-08-30 DIAGNOSIS — Z79899 Other long term (current) drug therapy: Secondary | ICD-10-CM | POA: Insufficient documentation

## 2015-08-30 DIAGNOSIS — I10 Essential (primary) hypertension: Secondary | ICD-10-CM | POA: Diagnosis not present

## 2015-08-30 DIAGNOSIS — Z791 Long term (current) use of non-steroidal anti-inflammatories (NSAID): Secondary | ICD-10-CM | POA: Diagnosis not present

## 2015-08-30 DIAGNOSIS — Z87891 Personal history of nicotine dependence: Secondary | ICD-10-CM | POA: Insufficient documentation

## 2015-08-30 HISTORY — DX: Retention of urine, unspecified: R33.9

## 2015-08-30 HISTORY — DX: Osteomyelitis, unspecified: M86.9

## 2015-08-30 LAB — CBG MONITORING, ED: GLUCOSE-CAPILLARY: 290 mg/dL — AB (ref 65–99)

## 2015-08-30 MED ORDER — OXYCODONE HCL 5 MG PO TABS
10.0000 mg | ORAL_TABLET | Freq: Once | ORAL | Status: AC
  Administered 2015-08-30: 10 mg via ORAL
  Filled 2015-08-30: qty 2

## 2015-08-30 NOTE — ED Notes (Signed)
Bed: WA17 Expected date:  Expected time:  Means of arrival:  Comments: EMS- 55yo M, "pain all over"/recent "bone infection"

## 2015-08-30 NOTE — ED Notes (Signed)
[facility] called for transportation  

## 2015-08-30 NOTE — ED Notes (Signed)
Pt is refusing discharge and states that he is "too sick to leave, actually was too sick to be discharged (from [facility]) in the first place." He states that he is cold and clammy; assessment shows that skin is warm and dry.  PA Dansie notified.

## 2015-08-30 NOTE — Discharge Instructions (Signed)
Pain Chronic pain can be defined as pain that is off and on and lasts for 3-6 months or longer. Many things cause chronic pain, which can make it difficult to make a diagnosis. There are many treatment options available for chronic pain. However, finding a treatment that works well for you may require trying various approaches until the right one is found. Many people benefit from a combination of two or more types of treatment to control their pain. SYMPTOMS  Chronic pain can occur anywhere in the body and can range from mild to very severe. Some types of chronic pain include:  Headache.  Low back pain.  Cancer pain.  Arthritis pain.  Neurogenic pain. This is pain resulting from damage to nerves. People with chronic pain may also have other symptoms such as:  Depression.  Anger.  Insomnia.  Anxiety. DIAGNOSIS  Your health care provider will help diagnose your condition over time. In many cases, the initial focus will be on excluding possible conditions that could be causing the pain. Depending on your symptoms, your health care provider may order tests to diagnose your condition. Some of these tests may include:   Blood tests.   CT scan.   MRI.   X-rays.   Ultrasounds.   Nerve conduction studies.  You may need to see a specialist.  TREATMENT  Finding treatment that works well may take time. You may be referred to a pain specialist. He or she may prescribe medicine or therapies, such as:   Mindful meditation or yoga.  Shots (injections) of numbing or pain-relieving medicines into the spine or area of pain.  Local electrical stimulation.  Acupuncture.   Massage therapy.   Aroma, color, light, or sound therapy.   Biofeedback.   Working with a physical therapist to keep from getting stiff.   Regular, gentle exercise.   Cognitive or behavioral therapy.   Group support.  Sometimes, surgery may be recommended.  HOME CARE INSTRUCTIONS   Take  all medicines as directed by your health care provider.   Lessen stress in your life by relaxing and doing things such as listening to calming music.   Exercise or be active as directed by your health care provider.   Eat a healthy diet and include things such as vegetables, fruits, fish, and lean meats in your diet.   Keep all follow-up appointments with your health care provider.   Attend a support group with others suffering from chronic pain. SEEK MEDICAL CARE IF:   Your pain gets worse.   You develop a new pain that was not there before.   You cannot tolerate medicines given to you by your health care provider.   You have new symptoms since your last visit with your health care provider.  SEEK IMMEDIATE MEDICAL CARE IF:   You feel weak.   You have decreased sensation or numbness.   You lose control of bowel or bladder function.   Your pain suddenly gets much worse.   You develop shaking.  You develop chills.  You develop confusion.  You develop chest pain.  You develop shortness of breath.  MAKE SURE YOU:  Understand these instructions.  Will watch your condition.  Will get help right away if you are not doing well or get worse.   This information is not intended to replace advice given to you by your health care provider. Make sure you discuss any questions you have with your health care provider.   Document Released: 02/24/2002 Document  Revised: 02/04/2013 Document Reviewed: 11/28/2012 Elsevier Interactive Patient Education 2016 Elsevier Inc.  Blood Glucose Monitoring, Adult Monitoring your blood glucose (also know as blood sugar) helps you to manage your diabetes. It also helps you and your health care provider monitor your diabetes and determine how well your treatment plan is working. WHY SHOULD YOU MONITOR YOUR BLOOD GLUCOSE?  It can help you understand how food, exercise, and medicine affect your blood glucose.  It allows you to  know what your blood glucose is at any given moment. You can quickly tell if you are having low blood glucose (hypoglycemia) or high blood glucose (hyperglycemia).  It can help you and your health care provider know how to adjust your medicines.  It can help you understand how to manage an illness or adjust medicine for exercise. WHEN SHOULD YOU TEST? Your health care provider will help you decide how often you should check your blood glucose. This may depend on the type of diabetes you have, your diabetes control, or the types of medicines you are taking. Be sure to write down all of your blood glucose readings so that this information can be reviewed with your health care provider. See below for examples of testing times that your health care provider may suggest. Type 1 Diabetes  Test at least 2 times per day if your diabetes is well controlled, if you are using an insulin pump, or if you perform multiple daily injections.  If your diabetes is not well controlled or if you are sick, you may need to test more often.  It is a good idea to also test:  Before every insulin injection.  Before and after exercise.  Between meals and 2 hours after a meal.  Occasionally between 2:00 a.m. and 3:00 a.m. Type 2 Diabetes  If you are taking insulin, test at least 2 times per day. However, it is best to test before every insulin injection.  If you take medicines by mouth (orally), test 2 times a day.  If you are on a controlled diet, test once a day.  If your diabetes is not well controlled or if you are sick, you may need to monitor more often. HOW TO MONITOR YOUR BLOOD GLUCOSE Supplies Needed  Blood glucose meter.  Test strips for your meter. Each meter has its own strips. You must use the strips that go with your own meter.  A pricking needle (lancet).  A device that holds the lancet (lancing device).  A journal or log book to write down your results. Procedure  Wash your hands  with soap and water. Alcohol is not preferred.  Prick the side of your finger (not the tip) with the lancet.  Gently milk the finger until a small drop of blood appears.  Follow the instructions that come with your meter for inserting the test strip, applying blood to the strip, and using your blood glucose meter. Other Areas to Get Blood for Testing Some meters allow you to use other areas of your body (other than your finger) to test your blood. These areas are called alternative sites. The most common alternative sites are:  The forearm.  The thigh.  The back area of the lower leg.  The palm of the hand. The blood flow in these areas is slower. Therefore, the blood glucose values you get may be delayed, and the numbers are different from what you would get from your fingers. Do not use alternative sites if you think you are having  hypoglycemia. Your reading will not be accurate. Always use a finger if you are having hypoglycemia. Also, if you cannot feel your lows (hypoglycemia unawareness), always use your fingers for your blood glucose checks. ADDITIONAL TIPS FOR GLUCOSE MONITORING  Do not reuse lancets.  Always carry your supplies with you.  All blood glucose meters have a 24-hour "hotline" number to call if you have questions or need help.  Adjust (calibrate) your blood glucose meter with a control solution after finishing a few boxes of strips. BLOOD GLUCOSE RECORD KEEPING It is a good idea to keep a daily record or log of your blood glucose readings. Most glucose meters, if not all, keep your glucose records stored in the meter. Some meters come with the ability to download your records to your home computer. Keeping a record of your blood glucose readings is especially helpful if you are wanting to look for patterns. Make notes to go along with the blood glucose readings because you might forget what happened at that exact time. Keeping good records helps you and your health  care provider to work together to achieve good diabetes management.    This information is not intended to replace advice given to you by your health care provider. Make sure you discuss any questions you have with your health care provider.   Document Released: 06/07/2003 Document Revised: 06/25/2014 Document Reviewed: 10/27/2012 Elsevier Interactive Patient Education [employer].

## 2015-08-30 NOTE — ED Notes (Signed)
Pt presents via EMS from [facility] where he has been staying since yesterday after discharge from [facility] with osteomyelitis.  He complains of 10/10 body aches and states that the staff at [facility] have been withholding pain medications from him.  Also reports pain at foley catheter site.  He has a PICC line in right upper chest.  Refused all care from EMS except for vital signs; he was hypertensive en route at 188/100.

## 2015-08-30 NOTE — ED Provider Notes (Signed)
CSN: [id number]     Arrival date & time 08/30/15  1732 History   First MD Initiated Contact with Patient 08/30/15 1816     Chief Complaint  Patient presents with  . Generalized Body Aches    Dartanyon Turi is a 55 y.o. male who presents to the ED from [facility] complaining of generalized body pain. He states "I need more pain medicine. They ain't giving me enough at the nursing home." He last had oxycodone 10 mg at 3:30 pm at the nursing facility. The patient was discharged yesterday from [facility] for MRSA bacteremia with multifocal osteomyelitis. He is staying at [facility] as he is receiving Vancomycin via his PICC line. He complains of pain all over and is unable to describe any focal pain. Notes from his hospital stay report they had "significant issues with attempts to wean his pain regimen." He has orders for oxycodone 20 mg five times a day and percocet 5-325 twice a day. He reports he wants to come back into the hospital because he was getting more pain medications in the hospital. He reports they are not giving him enough at his SNF. He also reports wanting to take his foley cath out because it is irritating him. He is tired of having a catheter. He reports he has been getting his IV antibiotics. He denies fevers, coughing, trouble breathing, hematuria, diarrhea, vomiting, nausea.   The history is provided by the patient and medical records. No language interpreter was used.    Past Medical History  Diagnosis Date  . Diabetes mellitus without complication ([place])   . Hypertension   . DDD (degenerative disc disease), lumbar   . Osteomyelitis ([place])   . Urinary retention    History reviewed. No pertinent past surgical history. Family History  Problem Relation Age of Onset  . Cancer Other   . Diabetes Other    Social History  Substance Use Topics  . Smoking status: Former Smoker -- 3.00 packs/day    Types: Cigarettes  . Smokeless tobacco: Never Used  . Alcohol Use: No    Review of  Systems  Constitutional: Negative for fever, chills and appetite change.  HENT: Negative for congestion and sore throat.   Eyes: Negative for visual disturbance.  Respiratory: Negative for cough and shortness of breath.   Cardiovascular: Negative for chest pain.  Gastrointestinal: Negative for nausea, vomiting, abdominal pain and diarrhea.  Genitourinary: Negative for hematuria and testicular pain.  Musculoskeletal: Positive for myalgias, back pain and arthralgias. Negative for neck pain.  Skin: Negative for rash.  Neurological: Negative for syncope and headaches.      Allergies  Review of patient's allergies indicates no known allergies.  Home Medications   Prior to Admission medications   Medication Sig Start Date End Date Taking? Authorizing Provider  albuterol (PROVENTIL HFA;VENTOLIN HFA) 108 (90 Base) MCG/ACT inhaler Inhale 2 puffs into the lungs every 6 (six) hours as needed for wheezing or shortness of breath.   Yes Historical Provider, MD  diclofenac sodium (VOLTAREN) 1 % GEL Apply 2 g topically 4 (four) times daily.   Yes Historical Provider, MD  docusate sodium (COLACE) 100 MG capsule Take 100 mg by mouth 2 (two) times daily.   Yes Historical Provider, MD  furosemide (LASIX) 40 MG tablet Take 40 mg by mouth daily.  11/07/13  Yes Historical Provider, MD  glimepiride (AMARYL) 2 MG tablet Take 2 mg by mouth daily with breakfast.   Yes Historical Provider, MD  insulin glargine (LANTUS)  100 UNIT/ML injection Inject 40 Units into the skin at bedtime.   Yes Historical Provider, MD  insulin lispro (HUMALOG) 100 UNIT/ML injection Inject 0-12 Units into the skin daily as needed for high blood sugar.  08/29/15  Yes Historical Provider, MD  lisinopril (PRINIVIL,ZESTRIL) 10 MG tablet Take 10 mg by mouth daily.   Yes Historical Provider, MD  magnesium oxide (MAG-OX) 400 MG tablet Take 400 mg by mouth 2 (two) times daily.   Yes Historical Provider, MD  metoprolol succinate (TOPROL-XL) 50 MG  24 hr tablet Take 50 mg by mouth daily. Take with or immediately following a meal.   Yes Historical Provider, MD  omeprazole (PRILOSEC) 20 MG capsule Take 20 mg by mouth daily.  11/09/13  Yes Historical Provider, MD  oxyCODONE 30 MG 12 hr tablet Take 90 mg by mouth every 12 (twelve) hours.   Yes Historical Provider, MD  Oxycodone HCl 10 MG TABS Take 10 mg by mouth every 6 (six) hours as needed (pain).   Yes Historical Provider, MD  PARoxetine (PAXIL) 40 MG tablet Take 40 mg by mouth every morning.   Yes Historical Provider, MD  polyethylene glycol (MIRALAX / GLYCOLAX) packet Take 17 g by mouth daily as needed for mild constipation.   Yes Historical Provider, MD  pregabalin (LYRICA) 150 MG capsule Take 150 mg by mouth 3 (three) times daily.   Yes Historical Provider, MD  tamsulosin (FLOMAX) 0.4 MG CAPS capsule Take 0.4 mg by mouth daily.  11/26/13  Yes Historical Provider, MD  Vancomycin HCl in NaCl 1.25-0.9 GM/250ML-% SOLN Inject 1,250 mLs into the vein every 12 (twelve) hours.   Yes Historical Provider, MD  acetaminophen (TYLENOL) 500 MG tablet Take 1,500 mg by mouth every 6 (six) hours as needed for headache.    Historical Provider, MD  amoxicillin-clavulanate (AUGMENTIN) 875-125 MG per tablet Take 1 tablet by mouth every 12 (twelve) hours. Patient not taking: Reported on 08/30/2015 02/03/14   [name], [name]  bisoprolol-hydrochlorothiazide [facility]) 5-6.25 MG per tablet Take 1 tablet by mouth daily. Reported on 08/30/2015 11/09/13   Historical Provider, MD  isosorbide mononitrate (IMDUR) 15 mg TB24 Take 0.5 tablets (15 mg total) by mouth daily. Patient not taking: Reported on 08/30/2015 04/17/12   [name], MD  oxyCODONE-acetaminophen (PERCOCET/ROXICET) 5-325 MG per tablet Take 1 tablet by mouth every 6 (six) hours as needed for severe pain. Patient not taking: Reported on 08/30/2015 02/03/14   [name], [name]  silver sulfADIAZINE (SILVADENE) 1 % cream Apply 1 application topically  daily. Patient not taking: Reported on 08/30/2015 12/07/13   [name], [name]  sulfamethoxazole-trimethoprim (SEPTRA DS) 800-160 MG per tablet Take 1 tablet by mouth every 12 (twelve) hours. Patient not taking: Reported on 08/30/2015 05/15/14   [name], MD  urea (UREACIN-20) 20 % cream Apply topically as needed. Patient not taking: Reported on 08/30/2015 12/25/13   [name], [name]  VICTOZA 18 MG/3ML SOPN Inject 6 mLs into the skin 2 (two) times daily.  11/23/13   Historical Provider, MD   BP 174/96 mmHg  Pulse 91  Temp(Src) 98.5 F (36.9 C) (Oral)  Resp 16  Ht 6' (1.829 m)  Wt 104.327 kg  BMI 31.19 kg/m2  SpO2 95% Physical Exam  Constitutional: He is oriented to person, place, and time. He appears well-developed and well-nourished. No distress.  Non-toxic appearing.   HENT:  Head: Normocephalic and atraumatic.  Mouth/Throat: Oropharynx is clear and moist.  Eyes: Conjunctivae are normal. Pupils  are equal, round, and reactive to light. Right eye exhibits no discharge. Left eye exhibits no discharge.  Neck: Neck supple.  Cardiovascular: Normal rate, regular rhythm, normal heart sounds and intact distal pulses.  Exam reveals no gallop and no friction rub.   No murmur heard. Pulmonary/Chest: Effort normal and breath sounds normal. No respiratory distress. He has no wheezes. He has no rales.  PICC line in right chest. Area is clean and dry. No signs of infection. Lungs CTA.   Abdominal: Soft. There is no tenderness. There is no guarding.  Abdomen is soft and non-tender to palpation.   Genitourinary: Penis normal.  Foley cath in place. Draining straw colored urine. No rashes. No bleeding.   Musculoskeletal: He exhibits no edema.  Patient is spontaneously moving all extremities in a coordinated fashion exhibiting good strength. He is pulling himself up in bed and raising both of his feet up in the air so I can see them better.   Lymphadenopathy:    He has no cervical  adenopathy.  Neurological: He is alert and oriented to person, place, and time. Coordination normal.  Alert and oriented x3.   Skin: Skin is warm and dry. No rash noted. He is not diaphoretic. No pallor.  Wet skin dermatitis to his gluteal cleft is in dressing, beneath dressing area appears to be healing well. No ulcers. No discharge.  Patient has old ulcer to right foot as well. It is clean and dry. No discharge. No erythema.   Psychiatric: He has a normal mood and affect. His behavior is normal.  Patient is upset that his not getting enough pain medication at the SNF.   Nursing note and vitals reviewed.   ED Course  Procedures (including critical care time) Labs Review Labs Reviewed  CBG MONITORING, ED - Abnormal; Notable for the following:    Glucose-Capillary 290 (*)    All other components within normal limits    Imaging Review No results found. I have personally reviewed and evaluated these lab results as part of my medical decision-making.   EKG Interpretation None      Filed Vitals:   08/30/15 1732 08/30/15 1735 08/30/15 1943 08/30/15 2057  BP: 159/87  185/93 174/96  Pulse: 90  93 91  Temp: 98.5 F (36.9 C)     TempSrc: Oral     Resp: 18  18 16   Height:  6' (1.829 m)    Weight:  104.327 kg    SpO2: 95%  94% 95%     MDM   Meds given in ED:  Medications  oxyCODONE (Oxy IR/ROXICODONE) immediate release tablet 10 mg (10 mg Oral Given 08/30/15 1904)    New Prescriptions   No medications on file    Final diagnoses:  Generalized pain  Type 2 diabetes mellitus with complication, unspecified long term insulin use status ([place])   This is a 55 y.o. male who presents to the ED from [facility] complaining of generalized body pain. He states "I need more pain medicine. They ain't giving me enough at the nursing home." He last had oxycodone 10 mg at 3:30 pm at the nursing facility. The patient was discharged yesterday from [facility] for MRSA bacteremia with multifocal  osteomyelitis. He is staying at [facility] as he is receiving Vancomycin via his PICC line. He complains of pain all over and is unable to describe any focal pain. Notes from his hospital stay report they had "significant issues with attempts to wean his pain regimen." He  has orders for oxycodone 20 mg five times a day and percocet 5-325 twice a day. He reports he wants to come back into the hospital because he was getting more pain medications in the hospital. He reports they are not giving him enough at his SNF.  On exam the patient is afebrile nontoxic appearing. He is spontaneously moving all his extremities exhibiting good strength. He is upset because he is not receiving enough pain medicine. His sacral dermatitis in his ulcers appear clean and dry and free of infection. He has no signs of infection. He has a blood sugar of 290. He will need his insulin at the SNF.  After discussion with my attending, Dr. Lacinda Axon, we both agree the patient is safe for discharge and he has no evidence of any emergency medical condition and can be discharged back to his SNF.  Patient is upset and wants to be admitted for pain control. I explained that we could not do this and he would need to talk with the social workers at the skilled nursing facility if he is upset about staying there. He is still concerned about how much pain medication he is getting. I explained that I have him getting oxycodone 20 mg 5 times per day and Percocet 5-3 25 twice a day. I encouraged him to show his discharge paperwork which shows his medication list to the nursing facility if they are still questioning his pain medicines. He is realieved with this knowledge and agrees for discharge. He asks for the discharge paperwork so he can show staff at his facility. I encouraged him to follow up with the facility doctor and social worker as well. I advised to return to the emergency department with new or worsening symptoms or new concerns. The patient  verbalized understanding and agreement with plan.  This patient was discussed with Dr. Lacinda Axon who agrees with assessment and plan.   Waynetta Pean, [name] 08/31/15 0126  Nat Christen, MD 08/31/15 [phone]

## 2015-08-30 NOTE — ED Notes (Signed)
duoderm changed on patients sacrum

## 2015-09-21 ENCOUNTER — Encounter (HOSPITAL_BASED_OUTPATIENT_CLINIC_OR_DEPARTMENT_OTHER): Payer: BLUE CROSS/BLUE SHIELD | Attending: Surgery

## 2015-10-03 ENCOUNTER — Ambulatory Visit: Payer: BLUE CROSS/BLUE SHIELD | Admitting: Internal Medicine

## 2015-10-03 DIAGNOSIS — M4646 Discitis, unspecified, lumbar region: Secondary | ICD-10-CM | POA: Insufficient documentation

## 2015-10-03 DIAGNOSIS — M86172 Other acute osteomyelitis, left ankle and foot: Secondary | ICD-10-CM | POA: Insufficient documentation

## 2015-10-03 DIAGNOSIS — J449 Chronic obstructive pulmonary disease, unspecified: Secondary | ICD-10-CM | POA: Insufficient documentation

## 2015-10-03 DIAGNOSIS — M869 Osteomyelitis, unspecified: Secondary | ICD-10-CM | POA: Insufficient documentation

## 2015-10-03 DIAGNOSIS — R7881 Bacteremia: Secondary | ICD-10-CM | POA: Insufficient documentation

## 2015-11-01 ENCOUNTER — Other Ambulatory Visit (HOSPITAL_COMMUNITY): Payer: Self-pay | Admitting: Internal Medicine

## 2015-11-01 DIAGNOSIS — I878 Other specified disorders of veins: Secondary | ICD-10-CM

## 2015-11-04 ENCOUNTER — Ambulatory Visit (HOSPITAL_COMMUNITY)
Admission: RE | Admit: 2015-11-04 | Discharge: 2015-11-04 | Disposition: A | Discharge status: Home / Self Care | Payer: Medicaid Other | Source: Ambulatory Visit | Attending: Internal Medicine | Admitting: Internal Medicine

## 2015-11-04 DIAGNOSIS — Z452 Encounter for adjustment and management of vascular access device: Secondary | ICD-10-CM | POA: Insufficient documentation

## 2015-11-04 DIAGNOSIS — I878 Other specified disorders of veins: Secondary | ICD-10-CM

## 2015-11-04 NOTE — Procedures (Signed)
Successful removal of tunneled (R)IJ PICC line No complications.  Ascencion Dike [name] Interventional Radiology 11/04/2015 11:15 AM

## 2015-11-07 ENCOUNTER — Inpatient Hospital Stay (HOSPITAL_COMMUNITY): Payer: Medicaid Other

## 2015-11-07 ENCOUNTER — Emergency Department (HOSPITAL_COMMUNITY): Payer: Medicaid Other

## 2015-11-07 ENCOUNTER — Encounter (HOSPITAL_COMMUNITY): Payer: Self-pay | Admitting: *Deleted

## 2015-11-07 ENCOUNTER — Inpatient Hospital Stay (HOSPITAL_COMMUNITY)
Admission: EM | Admit: 2015-11-07 | Discharge: 2015-12-02 | DRG: 853 | Disposition: A | Discharge status: Skilled Nursing Facility | Payer: Medicaid Other | Attending: Internal Medicine | Admitting: Internal Medicine

## 2015-11-07 DIAGNOSIS — Z452 Encounter for adjustment and management of vascular access device: Secondary | ICD-10-CM

## 2015-11-07 DIAGNOSIS — L97519 Non-pressure chronic ulcer of other part of right foot with unspecified severity: Secondary | ICD-10-CM | POA: Diagnosis present

## 2015-11-07 DIAGNOSIS — E1165 Type 2 diabetes mellitus with hyperglycemia: Secondary | ICD-10-CM | POA: Diagnosis present

## 2015-11-07 DIAGNOSIS — K5900 Constipation, unspecified: Secondary | ICD-10-CM | POA: Diagnosis not present

## 2015-11-07 DIAGNOSIS — E785 Hyperlipidemia, unspecified: Secondary | ICD-10-CM | POA: Diagnosis present

## 2015-11-07 DIAGNOSIS — G061 Intraspinal abscess and granuloma: Secondary | ICD-10-CM | POA: Diagnosis present

## 2015-11-07 DIAGNOSIS — J9811 Atelectasis: Secondary | ICD-10-CM | POA: Diagnosis not present

## 2015-11-07 DIAGNOSIS — G062 Extradural and subdural abscess, unspecified: Secondary | ICD-10-CM | POA: Diagnosis not present

## 2015-11-07 DIAGNOSIS — Z781 Physical restraint status: Secondary | ICD-10-CM

## 2015-11-07 DIAGNOSIS — Z418 Encounter for other procedures for purposes other than remedying health state: Secondary | ICD-10-CM

## 2015-11-07 DIAGNOSIS — J440 Chronic obstructive pulmonary disease with acute lower respiratory infection: Secondary | ICD-10-CM | POA: Diagnosis not present

## 2015-11-07 DIAGNOSIS — Z794 Long term (current) use of insulin: Secondary | ICD-10-CM

## 2015-11-07 DIAGNOSIS — I5032 Chronic diastolic (congestive) heart failure: Secondary | ICD-10-CM | POA: Diagnosis not present

## 2015-11-07 DIAGNOSIS — R1310 Dysphagia, unspecified: Secondary | ICD-10-CM | POA: Diagnosis not present

## 2015-11-07 DIAGNOSIS — M40209 Unspecified kyphosis, site unspecified: Secondary | ICD-10-CM

## 2015-11-07 DIAGNOSIS — L8990 Pressure ulcer of unspecified site, unspecified stage: Secondary | ICD-10-CM | POA: Diagnosis present

## 2015-11-07 DIAGNOSIS — M4622 Osteomyelitis of vertebra, cervical region: Secondary | ICD-10-CM | POA: Diagnosis present

## 2015-11-07 DIAGNOSIS — J15212 Pneumonia due to Methicillin resistant Staphylococcus aureus: Secondary | ICD-10-CM | POA: Diagnosis present

## 2015-11-07 DIAGNOSIS — R6520 Severe sepsis without septic shock: Secondary | ICD-10-CM

## 2015-11-07 DIAGNOSIS — A4102 Sepsis due to Methicillin resistant Staphylococcus aureus: Principal | ICD-10-CM | POA: Diagnosis present

## 2015-11-07 DIAGNOSIS — D6489 Other specified anemias: Secondary | ICD-10-CM | POA: Diagnosis present

## 2015-11-07 DIAGNOSIS — M4626 Osteomyelitis of vertebra, lumbar region: Secondary | ICD-10-CM | POA: Diagnosis present

## 2015-11-07 DIAGNOSIS — E118 Type 2 diabetes mellitus with unspecified complications: Secondary | ICD-10-CM

## 2015-11-07 DIAGNOSIS — F1193 Opioid use, unspecified with withdrawal: Secondary | ICD-10-CM | POA: Diagnosis present

## 2015-11-07 DIAGNOSIS — R339 Retention of urine, unspecified: Secondary | ICD-10-CM | POA: Diagnosis present

## 2015-11-07 DIAGNOSIS — I471 Supraventricular tachycardia: Secondary | ICD-10-CM | POA: Diagnosis not present

## 2015-11-07 DIAGNOSIS — M21372 Foot drop, left foot: Secondary | ICD-10-CM | POA: Diagnosis present

## 2015-11-07 DIAGNOSIS — E1169 Type 2 diabetes mellitus with other specified complication: Secondary | ICD-10-CM | POA: Diagnosis present

## 2015-11-07 DIAGNOSIS — I10 Essential (primary) hypertension: Secondary | ICD-10-CM | POA: Diagnosis present

## 2015-11-07 DIAGNOSIS — B9620 Unspecified Escherichia coli [E. coli] as the cause of diseases classified elsewhere: Secondary | ICD-10-CM | POA: Diagnosis present

## 2015-11-07 DIAGNOSIS — J9601 Acute respiratory failure with hypoxia: Secondary | ICD-10-CM | POA: Diagnosis not present

## 2015-11-07 DIAGNOSIS — Z993 Dependence on wheelchair: Secondary | ICD-10-CM

## 2015-11-07 DIAGNOSIS — R6521 Severe sepsis with septic shock: Secondary | ICD-10-CM | POA: Diagnosis not present

## 2015-11-07 DIAGNOSIS — G9520 Unspecified cord compression: Secondary | ICD-10-CM | POA: Diagnosis present

## 2015-11-07 DIAGNOSIS — A419 Sepsis, unspecified organism: Secondary | ICD-10-CM

## 2015-11-07 DIAGNOSIS — M21371 Foot drop, right foot: Secondary | ICD-10-CM | POA: Diagnosis present

## 2015-11-07 DIAGNOSIS — I272 Other secondary pulmonary hypertension: Secondary | ICD-10-CM | POA: Diagnosis present

## 2015-11-07 DIAGNOSIS — E876 Hypokalemia: Secondary | ICD-10-CM | POA: Diagnosis not present

## 2015-11-07 DIAGNOSIS — E1142 Type 2 diabetes mellitus with diabetic polyneuropathy: Secondary | ICD-10-CM | POA: Diagnosis present

## 2015-11-07 DIAGNOSIS — I110 Hypertensive heart disease with heart failure: Secondary | ICD-10-CM | POA: Diagnosis present

## 2015-11-07 DIAGNOSIS — J9690 Respiratory failure, unspecified, unspecified whether with hypoxia or hypercapnia: Secondary | ICD-10-CM

## 2015-11-07 DIAGNOSIS — N390 Urinary tract infection, site not specified: Secondary | ICD-10-CM

## 2015-11-07 DIAGNOSIS — G8929 Other chronic pain: Secondary | ICD-10-CM | POA: Diagnosis present

## 2015-11-07 DIAGNOSIS — Z4659 Encounter for fitting and adjustment of other gastrointestinal appliance and device: Secondary | ICD-10-CM

## 2015-11-07 DIAGNOSIS — R29898 Other symptoms and signs involving the musculoskeletal system: Secondary | ICD-10-CM | POA: Diagnosis present

## 2015-11-07 DIAGNOSIS — R4182 Altered mental status, unspecified: Secondary | ICD-10-CM | POA: Diagnosis present

## 2015-11-07 DIAGNOSIS — Z79899 Other long term (current) drug therapy: Secondary | ICD-10-CM

## 2015-11-07 DIAGNOSIS — N179 Acute kidney failure, unspecified: Secondary | ICD-10-CM | POA: Diagnosis present

## 2015-11-07 DIAGNOSIS — M4806 Spinal stenosis, lumbar region: Secondary | ICD-10-CM | POA: Diagnosis present

## 2015-11-07 DIAGNOSIS — N319 Neuromuscular dysfunction of bladder, unspecified: Secondary | ICD-10-CM | POA: Diagnosis present

## 2015-11-07 DIAGNOSIS — R0902 Hypoxemia: Secondary | ICD-10-CM

## 2015-11-07 DIAGNOSIS — M869 Osteomyelitis, unspecified: Secondary | ICD-10-CM | POA: Diagnosis present

## 2015-11-07 DIAGNOSIS — Z87891 Personal history of nicotine dependence: Secondary | ICD-10-CM

## 2015-11-07 DIAGNOSIS — M7138 Other bursal cyst, other site: Secondary | ICD-10-CM | POA: Diagnosis present

## 2015-11-07 DIAGNOSIS — G9341 Metabolic encephalopathy: Secondary | ICD-10-CM | POA: Diagnosis present

## 2015-11-07 DIAGNOSIS — IMO0002 Reserved for concepts with insufficient information to code with codable children: Secondary | ICD-10-CM | POA: Diagnosis present

## 2015-11-07 DIAGNOSIS — R509 Fever, unspecified: Secondary | ICD-10-CM

## 2015-11-07 DIAGNOSIS — F419 Anxiety disorder, unspecified: Secondary | ICD-10-CM | POA: Diagnosis not present

## 2015-11-07 DIAGNOSIS — M4802 Spinal stenosis, cervical region: Secondary | ICD-10-CM | POA: Diagnosis present

## 2015-11-07 DIAGNOSIS — Z7984 Long term (current) use of oral hypoglycemic drugs: Secondary | ICD-10-CM

## 2015-11-07 DIAGNOSIS — J441 Chronic obstructive pulmonary disease with (acute) exacerbation: Secondary | ICD-10-CM | POA: Diagnosis present

## 2015-11-07 DIAGNOSIS — Z419 Encounter for procedure for purposes other than remedying health state, unspecified: Secondary | ICD-10-CM

## 2015-11-07 DIAGNOSIS — Z978 Presence of other specified devices: Secondary | ICD-10-CM

## 2015-11-07 DIAGNOSIS — R451 Restlessness and agitation: Secondary | ICD-10-CM | POA: Diagnosis not present

## 2015-11-07 DIAGNOSIS — M40292 Other kyphosis, cervical region: Secondary | ICD-10-CM | POA: Diagnosis present

## 2015-11-07 DIAGNOSIS — M4640 Discitis, unspecified, site unspecified: Secondary | ICD-10-CM | POA: Diagnosis present

## 2015-11-07 DIAGNOSIS — M4642 Discitis, unspecified, cervical region: Secondary | ICD-10-CM | POA: Diagnosis present

## 2015-11-07 HISTORY — DX: Other chronic pain: G89.29

## 2015-11-07 HISTORY — DX: Bacteremia: R78.81

## 2015-11-07 HISTORY — DX: Acute respiratory failure, unspecified whether with hypoxia or hypercapnia: J96.00

## 2015-11-07 HISTORY — DX: Chronic obstructive pulmonary disease, unspecified: J44.9

## 2015-11-07 HISTORY — DX: Anemia, unspecified: D64.9

## 2015-11-07 HISTORY — DX: Dorsalgia, unspecified: M54.9

## 2015-11-07 HISTORY — DX: Methicillin resistant Staphylococcus aureus infection as the cause of diseases classified elsewhere: B95.62

## 2015-11-07 LAB — I-STAT ARTERIAL BLOOD GAS, ED
Acid-base deficit: 2 mmol/L (ref 0.0–2.0)
Bicarbonate: 23.9 mEq/L (ref 20.0–24.0)
O2 SAT: 100 %
PCO2 ART: 46.6 mmHg — AB (ref 35.0–45.0)
PH ART: 7.318 — AB (ref 7.350–7.450)
PO2 ART: 432 mmHg — AB (ref 80.0–100.0)
Patient temperature: 98.6
TCO2: 25 mmol/L (ref 0–100)

## 2015-11-07 LAB — RAPID URINE DRUG SCREEN, HOSP PERFORMED
Amphetamines: NOT DETECTED
BENZODIAZEPINES: NOT DETECTED
Barbiturates: NOT DETECTED
COCAINE: NOT DETECTED
OPIATES: POSITIVE — AB
Tetrahydrocannabinol: NOT DETECTED

## 2015-11-07 LAB — COMPREHENSIVE METABOLIC PANEL
ALK PHOS: 89 U/L (ref 38–126)
ALT: 14 U/L — ABNORMAL LOW (ref 17–63)
ANION GAP: 15 (ref 5–15)
AST: 19 U/L (ref 15–41)
Albumin: 3.2 g/dL — ABNORMAL LOW (ref 3.5–5.0)
BILIRUBIN TOTAL: 0.9 mg/dL (ref 0.3–1.2)
BUN: 74 mg/dL — ABNORMAL HIGH (ref 6–20)
CALCIUM: 9.7 mg/dL (ref 8.9–10.3)
CO2: 24 mmol/L (ref 22–32)
Chloride: 99 mmol/L — ABNORMAL LOW (ref 101–111)
Creatinine, Ser: 3.33 mg/dL — ABNORMAL HIGH (ref 0.61–1.24)
GFR, EST AFRICAN AMERICAN: 22 mL/min — AB (ref >60)
GFR, EST NON AFRICAN AMERICAN: 19 mL/min — AB (ref >60)
Glucose, Bld: 195 mg/dL — ABNORMAL HIGH (ref 65–99)
Potassium: 4.7 mmol/L (ref 3.5–5.1)
SODIUM: 138 mmol/L (ref 135–145)
TOTAL PROTEIN: 8.2 g/dL — AB (ref 6.5–8.1)

## 2015-11-07 LAB — CBC WITH DIFFERENTIAL/PLATELET
BASOS ABS: 0 10*3/uL (ref 0.0–0.1)
Basophils Relative: 0 %
Eosinophils Absolute: 0 10*3/uL (ref 0.0–0.7)
Eosinophils Relative: 0 %
HCT: 30.1 % — ABNORMAL LOW (ref 39.0–52.0)
HEMOGLOBIN: 9.1 g/dL — AB (ref 13.0–17.0)
LYMPHS ABS: 2.1 10*3/uL (ref 0.7–4.0)
Lymphocytes Relative: 12 %
MCH: 26.1 pg (ref 26.0–34.0)
MCHC: 30.2 g/dL (ref 30.0–36.0)
MCV: 86.5 fL (ref 78.0–100.0)
MONOS PCT: 7 %
Monocytes Absolute: 1.2 10*3/uL — ABNORMAL HIGH (ref 0.1–1.0)
NEUTROS ABS: 13.8 10*3/uL — AB (ref 1.7–7.7)
Neutrophils Relative %: 81 %
Platelets: 257 10*3/uL (ref 150–400)
RBC: 3.48 MIL/uL — ABNORMAL LOW (ref 4.22–5.81)
RDW: 16.7 % — ABNORMAL HIGH (ref 11.5–15.5)
WBC: 17.1 10*3/uL — ABNORMAL HIGH (ref 4.0–10.5)

## 2015-11-07 LAB — URINALYSIS, ROUTINE W REFLEX MICROSCOPIC
Bilirubin Urine: NEGATIVE
GLUCOSE, UA: NEGATIVE mg/dL
Ketones, ur: 15 mg/dL — AB
Nitrite: NEGATIVE
Protein, ur: 100 mg/dL — AB
SPECIFIC GRAVITY, URINE: 1.015 (ref 1.005–1.030)
pH: 5.5 (ref 5.0–8.0)

## 2015-11-07 LAB — I-STAT CG4 LACTIC ACID, ED
LACTIC ACID, VENOUS: 1.93 mmol/L (ref 0.5–2.0)
LACTIC ACID, VENOUS: 0.42 mmol/L — AB (ref 0.5–2.0)

## 2015-11-07 LAB — GLUCOSE, CAPILLARY
GLUCOSE-CAPILLARY: 163 mg/dL — AB (ref 65–99)
GLUCOSE-CAPILLARY: 211 mg/dL — AB (ref 65–99)
Glucose-Capillary: 148 mg/dL — ABNORMAL HIGH (ref 65–99)
Glucose-Capillary: 224 mg/dL — ABNORMAL HIGH (ref 65–99)

## 2015-11-07 LAB — CBG MONITORING, ED: GLUCOSE-CAPILLARY: 197 mg/dL — AB (ref 65–99)

## 2015-11-07 LAB — BRAIN NATRIURETIC PEPTIDE: B Natriuretic Peptide: 75.6 pg/mL (ref 0.0–100.0)

## 2015-11-07 LAB — TROPONIN I: TROPONIN I: 0.03 ng/mL (ref <0.031)

## 2015-11-07 LAB — URINE MICROSCOPIC-ADD ON

## 2015-11-07 LAB — AMMONIA: Ammonia: 26 umol/L (ref 9–35)

## 2015-11-07 LAB — ETHANOL: Alcohol, Ethyl (B): <5 mg/dL (ref <5)

## 2015-11-07 LAB — CORTISOL: CORTISOL PLASMA: 21 ug/dL

## 2015-11-07 LAB — PROCALCITONIN: Procalcitonin: 3.96 ng/mL

## 2015-11-07 LAB — MRSA PCR SCREENING: MRSA by PCR: POSITIVE — AB

## 2015-11-07 MED ORDER — SODIUM CHLORIDE 0.9 % IV BOLUS (SEPSIS): 500.0000 mL | Freq: Once | INTRAVENOUS | Status: DC

## 2015-11-07 MED ORDER — SUCCINYLCHOLINE CHLORIDE 20 MG/ML IJ SOLN
150.0000 mg | Freq: Once | INTRAMUSCULAR | Status: AC
  Administered 2015-11-07: 150 mg via INTRAVENOUS

## 2015-11-07 MED ORDER — VANCOMYCIN HCL 10 G IV SOLR
2000.0000 mg | Freq: Once | INTRAVENOUS | Status: AC
  Administered 2015-11-07: 2000 mg via INTRAVENOUS
  Filled 2015-11-07: qty 2000

## 2015-11-07 MED ORDER — LORAZEPAM 2 MG/ML IJ SOLN
1.0000 mg | Freq: Once | INTRAMUSCULAR | Status: AC
  Administered 2015-11-07: 1 mg via INTRAVENOUS

## 2015-11-07 MED ORDER — TAMSULOSIN HCL 0.4 MG PO CAPS
0.4000 mg | ORAL_CAPSULE | Freq: Every day | ORAL | Status: DC
  Administered 2015-11-07 – 2015-12-02 (×24): 0.4 mg via ORAL
  Filled 2015-11-07 (×24): qty 1

## 2015-11-07 MED ORDER — VITAL HIGH PROTEIN PO LIQD
1000.0000 mL | ORAL | Status: DC
  Administered 2015-11-07: 1000 mL
  Administered 2015-11-08 (×2)
  Administered 2015-11-08: 1000 mL
  Administered 2015-11-08: 17:00:00
  Administered 2015-11-09 – 2015-11-11 (×3): 1000 mL
  Administered 2015-11-12: 06:00:00
  Administered 2015-11-12: 1000 mL
  Administered 2015-11-13: 05:00:00
  Administered 2015-11-13: 1000 mL
  Administered 2015-11-13: 06:00:00
  Administered 2015-11-14 – 2015-11-15 (×2): 1000 mL
  Administered 2015-11-15 – 2015-11-16 (×4)

## 2015-11-07 MED ORDER — LORAZEPAM 2 MG/ML IJ SOLN
2.0000 mg | Freq: Once | INTRAMUSCULAR | Status: AC
  Administered 2015-11-07: 2 mg via INTRAMUSCULAR

## 2015-11-07 MED ORDER — IPRATROPIUM-ALBUTEROL 0.5-2.5 (3) MG/3ML IN SOLN: 3.0000 mL | RESPIRATORY_TRACT | Status: DC | PRN

## 2015-11-07 MED ORDER — SODIUM CHLORIDE 0.9 % IV SOLN
1500.0000 mg | INTRAVENOUS | Status: DC
  Administered 2015-11-09: 1500 mg via INTRAVENOUS
  Filled 2015-11-07: qty 1500

## 2015-11-07 MED ORDER — PIPERACILLIN-TAZOBACTAM 3.375 G IVPB 30 MIN
3.3750 g | Freq: Once | INTRAVENOUS | Status: AC
Start: 2015-11-07 — End: 2015-11-07
  Administered 2015-11-07: 3.375 g via INTRAVENOUS
  Filled 2015-11-07: qty 50

## 2015-11-07 MED ORDER — SODIUM CHLORIDE 0.9 % IV BOLUS (SEPSIS)
1000.0000 mL | Freq: Once | INTRAVENOUS | Status: AC
  Administered 2015-11-07: 1000 mL via INTRAVENOUS

## 2015-11-07 MED ORDER — LORAZEPAM 2 MG/ML IJ SOLN
INTRAMUSCULAR | Status: AC
  Filled 2015-11-07: qty 1

## 2015-11-07 MED ORDER — ACETAMINOPHEN 650 MG RE SUPP
650.0000 mg | Freq: Once | RECTAL | Status: AC
  Administered 2015-11-07: 650 mg via RECTAL

## 2015-11-07 MED ORDER — VANCOMYCIN HCL IN DEXTROSE 1-5 GM/200ML-% IV SOLN
1000.0000 mg | Freq: Once | INTRAVENOUS | Status: DC
  Filled 2015-11-07: qty 200

## 2015-11-07 MED ORDER — VITAL HIGH PROTEIN PO LIQD
1000.0000 mL | ORAL | Status: DC
  Administered 2015-11-07: 1000 mL

## 2015-11-07 MED ORDER — SODIUM CHLORIDE 0.9 % IV SOLN
10.0000 ug/h | INTRAVENOUS | Status: DC
  Administered 2015-11-07: 10 ug/h via INTRAVENOUS
  Filled 2015-11-07: qty 50

## 2015-11-07 MED ORDER — SODIUM CHLORIDE 0.9 % IV BOLUS (SEPSIS): 1000.0000 mL | Freq: Once | INTRAVENOUS | Status: DC

## 2015-11-07 MED ORDER — NOREPINEPHRINE BITARTRATE 1 MG/ML IV SOLN
0.0000 ug/min | Freq: Once | INTRAVENOUS | Status: AC
  Administered 2015-11-07: 5 ug/min via INTRAVENOUS
  Filled 2015-11-07: qty 4

## 2015-11-07 MED ORDER — NOREPINEPHRINE BITARTRATE 1 MG/ML IV SOLN
2.0000 ug/min | INTRAVENOUS | Status: DC
  Administered 2015-11-07: 33 ug/min via INTRAVENOUS
  Administered 2015-11-07: 20 ug/min via INTRAVENOUS
  Administered 2015-11-07: 22 ug/min via INTRAVENOUS
  Administered 2015-11-07: 25 ug/min via INTRAVENOUS
  Administered 2015-11-07 – 2015-11-08 (×2): 20 ug/min via INTRAVENOUS
  Filled 2015-11-07 (×8): qty 4

## 2015-11-07 MED ORDER — FENTANYL CITRATE (PF) 2500 MCG/50ML IJ SOLN
25.0000 ug/h | INTRAMUSCULAR | Status: DC
  Administered 2015-11-07: 250 ug/h via INTRAVENOUS
  Administered 2015-11-08: 200 ug/h via INTRAVENOUS
  Administered 2015-11-08: 400 ug/h via INTRAVENOUS
  Administered 2015-11-08: 300 ug/h via INTRAVENOUS
  Administered 2015-11-08: 100 ug/h via INTRAVENOUS
  Administered 2015-11-09: 250 ug/h via INTRAVENOUS
  Administered 2015-11-09: 400 ug/h via INTRAVENOUS
  Administered 2015-11-09: 250 ug/h via INTRAVENOUS
  Administered 2015-11-10 (×3): 300 ug/h via INTRAVENOUS
  Administered 2015-11-10 – 2015-11-11 (×2): 400 ug/h via INTRAVENOUS
  Administered 2015-11-11: 300 ug/h via INTRAVENOUS
  Administered 2015-11-11 – 2015-11-12 (×3): 400 ug/h via INTRAVENOUS
  Administered 2015-11-13: 300 ug/h via INTRAVENOUS
  Administered 2015-11-13 – 2015-11-15 (×9): 400 ug/h via INTRAVENOUS
  Administered 2015-11-16: 300 ug/h via INTRAVENOUS
  Filled 2015-11-07 (×28): qty 50

## 2015-11-07 MED ORDER — ACETAMINOPHEN 160 MG/5ML PO SOLN
650.0000 mg | Freq: Four times a day (QID) | ORAL | Status: DC | PRN
  Administered 2015-11-07 – 2015-11-18 (×10): 650 mg
  Filled 2015-11-07 (×12): qty 20.3

## 2015-11-07 MED ORDER — INSULIN ASPART 100 UNIT/ML ~~LOC~~ SOLN
0.0000 [IU] | SUBCUTANEOUS | Status: DC
  Administered 2015-11-07: 7 [IU] via SUBCUTANEOUS
  Administered 2015-11-07: 4 [IU] via SUBCUTANEOUS
  Administered 2015-11-07: 7 [IU] via SUBCUTANEOUS
  Administered 2015-11-07: 3 [IU] via SUBCUTANEOUS
  Administered 2015-11-08: 7 [IU] via SUBCUTANEOUS
  Administered 2015-11-08: 3 [IU] via SUBCUTANEOUS
  Administered 2015-11-08 (×2): 7 [IU] via SUBCUTANEOUS
  Administered 2015-11-08 – 2015-11-09 (×4): 4 [IU] via SUBCUTANEOUS
  Administered 2015-11-09 – 2015-11-10 (×5): 3 [IU] via SUBCUTANEOUS
  Administered 2015-11-10: 4 [IU] via SUBCUTANEOUS
  Administered 2015-11-10 – 2015-11-11 (×2): 3 [IU] via SUBCUTANEOUS
  Administered 2015-11-11: 4 [IU] via SUBCUTANEOUS
  Administered 2015-11-11: 3 [IU] via SUBCUTANEOUS
  Administered 2015-11-11 – 2015-11-12 (×2): 4 [IU] via SUBCUTANEOUS
  Administered 2015-11-12 (×2): 3 [IU] via SUBCUTANEOUS
  Administered 2015-11-12: 4 [IU] via SUBCUTANEOUS
  Administered 2015-11-12 (×2): 3 [IU] via SUBCUTANEOUS
  Administered 2015-11-13: 4 [IU] via SUBCUTANEOUS
  Administered 2015-11-13 – 2015-11-14 (×5): 3 [IU] via SUBCUTANEOUS
  Administered 2015-11-14: 4 [IU] via SUBCUTANEOUS
  Administered 2015-11-14 (×2): 3 [IU] via SUBCUTANEOUS
  Administered 2015-11-14 – 2015-11-15 (×4): 4 [IU] via SUBCUTANEOUS
  Administered 2015-11-15 (×3): 3 [IU] via SUBCUTANEOUS
  Administered 2015-11-15: 4 [IU] via SUBCUTANEOUS
  Administered 2015-11-16: 3 [IU] via SUBCUTANEOUS
  Administered 2015-11-16 (×3): 4 [IU] via SUBCUTANEOUS
  Administered 2015-11-16 (×2): 3 [IU] via SUBCUTANEOUS
  Administered 2015-11-17 (×2): 4 [IU] via SUBCUTANEOUS
  Administered 2015-11-17: 3 [IU] via SUBCUTANEOUS
  Administered 2015-11-17: 4 [IU] via SUBCUTANEOUS
  Administered 2015-11-17: 3 [IU] via SUBCUTANEOUS
  Administered 2015-11-18: 7 [IU] via SUBCUTANEOUS
  Administered 2015-11-18: 3 [IU] via SUBCUTANEOUS
  Administered 2015-11-18: 7 [IU] via SUBCUTANEOUS
  Administered 2015-11-18: 3 [IU] via SUBCUTANEOUS
  Administered 2015-11-18: 4 [IU] via SUBCUTANEOUS
  Administered 2015-11-19: 7 [IU] via SUBCUTANEOUS
  Administered 2015-11-19: 3 [IU] via SUBCUTANEOUS
  Administered 2015-11-19: 7 [IU] via SUBCUTANEOUS
  Administered 2015-11-19: 4 [IU] via SUBCUTANEOUS
  Administered 2015-11-19: 11 [IU] via SUBCUTANEOUS
  Administered 2015-11-20: 7 [IU] via SUBCUTANEOUS
  Administered 2015-11-20 (×2): 4 [IU] via SUBCUTANEOUS
  Administered 2015-11-20: 7 [IU] via SUBCUTANEOUS
  Administered 2015-11-21 (×2): 4 [IU] via SUBCUTANEOUS
  Administered 2015-11-21: 3 [IU] via SUBCUTANEOUS

## 2015-11-07 MED ORDER — NALOXONE HCL 0.4 MG/ML IJ SOLN
INTRAMUSCULAR | Status: AC
  Filled 2015-11-07: qty 1

## 2015-11-07 MED ORDER — SODIUM CHLORIDE 0.9 % IV SOLN
INTRAVENOUS | Status: DC
  Administered 2015-11-07 (×2): via INTRAVENOUS
  Administered 2015-11-08: 125 mL/h via INTRAVENOUS
  Administered 2015-11-08 – 2015-11-09 (×3): via INTRAVENOUS

## 2015-11-07 MED ORDER — DEXTROSE 5 % IV SOLN
1.0000 g | Freq: Every day | INTRAVENOUS | Status: DC
  Administered 2015-11-07 – 2015-11-09 (×3): 1 g via INTRAVENOUS
  Filled 2015-11-07 (×4): qty 1

## 2015-11-07 MED ORDER — SODIUM CHLORIDE 0.9 % IV SOLN
2.0000 mg/h | INTRAVENOUS | Status: DC
  Administered 2015-11-07: 2 mg/h via INTRAVENOUS
  Filled 2015-11-07: qty 10

## 2015-11-07 MED ORDER — ENOXAPARIN SODIUM 30 MG/0.3ML ~~LOC~~ SOLN
30.0000 mg | SUBCUTANEOUS | Status: DC
  Administered 2015-11-07: 30 mg via SUBCUTANEOUS
  Filled 2015-11-07 (×2): qty 0.3

## 2015-11-07 MED ORDER — CHLORHEXIDINE GLUCONATE 0.12% ORAL RINSE (MEDLINE KIT)
15.0000 mL | Freq: Two times a day (BID) | OROMUCOSAL | Status: DC
  Administered 2015-11-07 – 2015-11-12 (×12): 15 mL via OROMUCOSAL

## 2015-11-07 MED ORDER — ETOMIDATE 2 MG/ML IV SOLN
30.0000 mg | Freq: Once | INTRAVENOUS | Status: AC
  Administered 2015-11-07: 30 mg via INTRAVENOUS

## 2015-11-07 MED ORDER — PANTOPRAZOLE SODIUM 40 MG PO PACK
40.0000 mg | PACK | ORAL | Status: DC
  Administered 2015-11-07 – 2015-11-17 (×11): 40 mg
  Filled 2015-11-07 (×11): qty 20

## 2015-11-07 MED ORDER — LORAZEPAM 2 MG/ML IJ SOLN: 1.0000 mg | Freq: Once | INTRAMUSCULAR | Status: DC

## 2015-11-07 MED ORDER — NALOXONE HCL 0.4 MG/ML IJ SOLN
0.4000 mg | Freq: Once | INTRAMUSCULAR | Status: AC
  Administered 2015-11-07: 0.4 mg via INTRAVENOUS

## 2015-11-07 MED ORDER — SODIUM CHLORIDE 0.9 % IV SOLN
1000.0000 mL | INTRAVENOUS | Status: DC
  Administered 2015-11-07 (×2): 1000 mL via INTRAVENOUS

## 2015-11-07 MED ORDER — MIDAZOLAM HCL 2 MG/2ML IJ SOLN
2.0000 mg | INTRAMUSCULAR | Status: DC | PRN
  Administered 2015-11-07 – 2015-11-08 (×6): 2 mg via INTRAVENOUS
  Filled 2015-11-07 (×7): qty 2

## 2015-11-07 MED ORDER — FENTANYL BOLUS VIA INFUSION
50.0000 ug | INTRAVENOUS | Status: DC | PRN
  Administered 2015-11-08 – 2015-11-12 (×6): 50 ug via INTRAVENOUS
  Filled 2015-11-07: qty 50

## 2015-11-07 MED ORDER — ANTISEPTIC ORAL RINSE SOLUTION (CORINZ)
7.0000 mL | Freq: Four times a day (QID) | OROMUCOSAL | Status: DC
  Administered 2015-11-07 – 2015-11-12 (×22): 7 mL via OROMUCOSAL

## 2015-11-07 NOTE — Progress Notes (Signed)
EEG completed full report to follow

## 2015-11-07 NOTE — Progress Notes (Signed)
Patient arrived on unit vented with fentanyl, versed, and levophed gtts. Bp 64/41, HR 96, 100% sats.

## 2015-11-07 NOTE — ED Notes (Signed)
Pt has dislodged his IV, spoke with Dr Leonides Schanz, will give IM ativan.

## 2015-11-07 NOTE — ED Provider Notes (Signed)
By signing my name below, I, Gregory [name], attest that this documentation has been prepared under the direction and in the presence of [place], DO. Electronically Signed: Hansel [name], ED Scribe. 11/07/2015. 1:33 AM.   TIME SEEN: 1:09 AM   CHIEF COMPLAINT:  Chief Complaint  Patient presents with  . Altered Mental Status    LEVEL 5 CAVEAT: HPI and ROS limited due to AMS   HPI:  HPI Comments: Gregory [name] is a 55 y.o. male brought in by ambulance on 90 mg bid Oxycontin and 10 mg q4h Oxycodone who presents to the Emergency Department d/t AMS tonight. Per EMS, pt was seen acting normally by nursing staff at [name] and was found unresponsive at midnight. On EMS arrival, pt was diaphoretic, unresponsive and had O2 sat 86% on RA. He was placed on NRB and brought up to 98%. He was responsive to painful stimuli en route. CBG was 135 and he was administered 1 mg NarcanBy EMS. Patient became significantly more arousable and was combative and alert after Narcan. There was no seizure activity. Pt currently complains of SOB. He also complains of numbness to his legs, which is baseline. Denies any pain. Denies recent fever. He does not wear O2 at baseline. He does not appear to be on anticoagulation or antibiotics for his MAR from [name]  Code status unknown  ROS: Unable to perform ROS: AMS   PAST MEDICAL HISTORY/PAST SURGICAL HISTORY:  Past Medical History  Diagnosis Date  . Diabetes mellitus without complication ([name])   . Hypertension   . DDD (degenerative disc disease), lumbar   . Osteomyelitis (Greg[facility] [name])   . Urinary retention     MEDICATIONS:  Prior to Admission medications   Medication Sig Start Date End Date Taking? Authorizing Provider  acetaminophen (TYLENOL) 500 MG tablet Take 1,500 mg by mouth every 6 (six) hours as needed for headache.    Historical Provider, MD  albuterol (PROVENTIL HFA;VENTOLIN HFA) 108 (90 Base) MCG/ACT inhaler Inhale 2 puffs into  the lungs every 6 (six) hours as needed for wheezing or shortness of breath.    Historical Provider, MD  amoxicillin-clavulanate (AUGMENTIN) 875-125 MG per tablet Take 1 tablet by mouth every 12 (twelve) hours. Patient not taking: Reported on 08/30/2015 02/03/14   [name], [name]  bisoprolol-hydrochlorothiazide [facility]) 5-6.25 MG per tablet Take 1 tablet by mouth daily. Reported on 08/30/2015 11/09/13   Historical Provider, MD  diclofenac sodium (VOLTAREN) 1 % GEL Apply 2 g topically 4 (four) times daily.    Historical Provider, MD  docusate sodium (COLACE) 100 MG capsule Take 100 mg by mouth 2 (two) times daily.    Historical Provider, MD  furosemide (LASIX) 40 MG tablet Take 40 mg by mouth daily.  11/07/13   Historical Provider, MD  glimepiride (AMARYL) 2 MG tablet Take 2 mg by mouth daily with breakfast.    Historical Provider, MD  insulin glargine (LANTUS) 100 UNIT/ML injection Inject 40 Units into the skin at bedtime.    Historical Provider, MD  insulin lispro (HUMALOG) 100 UNIT/ML injection Inject 0-12 Units into the skin daily as needed for high blood sugar.  08/29/15   Historical Provider, MD  isosorbide mononitrate (IMDUR) 15 mg TB24 Take 0.5 tablets (15 mg total) by mouth daily. Patient not taking: Reported on 08/30/2015 04/17/12   [name], MD  lisinopril (PRINIVIL,ZESTRIL) 10 MG tablet Take 10 mg by mouth daily.    Historical Provider, MD  magnesium  oxide (MAG-OX) 400 MG tablet Take 400 mg by mouth 2 (two) times daily.    Historical Provider, MD  metoprolol succinate (TOPROL-XL) 50 MG 24 hr tablet Take 50 mg by mouth daily. Take with or immediately following a meal.    Historical Provider, MD  omeprazole (PRILOSEC) 20 MG capsule Take 20 mg by mouth daily.  11/09/13   Historical Provider, MD  oxyCODONE 30 MG 12 hr tablet Take 90 mg by mouth every 12 (twelve) hours.    Historical Provider, MD  Oxycodone HCl 10 MG TABS Take 10 mg by mouth every 6 (six) hours as needed (pain).     Historical Provider, MD  oxyCODONE-acetaminophen (PERCOCET/ROXICET) 5-325 MG per tablet Take 1 tablet by mouth every 6 (six) hours as needed for severe pain. Patient not taking: Reported on 08/30/2015 02/03/14   [name], [name]  PARoxetine (PAXIL) 40 MG tablet Take 40 mg by mouth every morning.    Historical Provider, MD  polyethylene glycol (MIRALAX / GLYCOLAX) packet Take 17 g by mouth daily as needed for mild constipation.    Historical Provider, MD  pregabalin (LYRICA) 150 MG capsule Take 150 mg by mouth 3 (three) times daily.    Historical Provider, MD  silver sulfADIAZINE (SILVADENE) 1 % cream Apply 1 application topically daily. Patient not taking: Reported on 08/30/2015 12/07/13   Gregory [name], [name]  sulfamethoxazole-trimethoprim (SEPTRA DS) 800-160 MG per tablet Take 1 tablet by mouth every 12 (twelve) hours. Patient not taking: Reported on 08/30/2015 05/15/14   Gregory Chapel, MD  tamsulosin (FLOMAX) 0.4 MG CAPS capsule Take 0.4 mg by mouth daily.  11/26/13   Historical Provider, MD  urea (UREACIN-20) 20 % cream Apply topically as needed. Patient not taking: Reported on 08/30/2015 12/25/13   Gregory [name], [name]  Vancomycin HCl in NaCl 1.25-0.9 GM/250ML-% SOLN Inject 1,250 mLs into the vein every 12 (twelve) hours.    Historical Provider, MD  VICTOZA 18 MG/3ML SOPN Inject 6 mLs into the skin 2 (two) times daily.  11/23/13   Historical Provider, MD    ALLERGIES:  No Known Allergies  SOCIAL HISTORY:  Social History  Substance Use Topics  . Smoking status: Former Smoker -- 3.00 packs/day    Types: Cigarettes  . Smokeless tobacco: Never Used  . Alcohol Use: No    FAMILY HISTORY: Family History  Problem Relation Age of Onset  . Cancer Other   . Diabetes Other     EXAM: BP 133/97 mmHg  Pulse 127  Resp 26  SpO2 100% CONSTITUTIONAL: Alert and oriented. Chronically ill-appearing. Appears agitated. Yelling out intermittently.  HEAD: Normocephalic; atraumatic EYES:  Conjunctivae clear, PERRL, EOMI ENT: normal nose; no rhinorrhea; moist mucous membranes; pharynx without lesions noted; no dental injury; no septal hematoma NECK: Supple, no meningismus, no LAD; no midline spinal tenderness, step-off or deformity CARD: Regular and tachycardic; S1 and S2 appreciated; no murmurs, no clicks, no rubs, no gallops RESP: Normal chest excursion without splinting or tachypnea; breath sounds clear and equal bilaterally; no wheezes, no rhonchi, no rales; hypoxic on room air CHEST:  chest wall stable, no crepitus or ecchymosis or deformity, nontender to palpation ABD/GI: Normal bowel sounds; non-distended; soft, non-tender, no rebound, no guarding PELVIS:  stable, nontender to palpation BACK:  The back appears normal and is non-tender to palpation, there is no CVA tenderness; no midline spinal tenderness, step-off or deformity EXT: Normal ROM in all joints; non-tender to palpation; no edema; normal capillary refill; no cyanosis, no bony  tenderness or bony deformity of patient's extremities, no joint effusion, no ecchymosis or lacerations    SKIN: Normal color for age and race; warm NEURO: Moves all extremities equally, sensation to light touch intact diffusely, cranial nerves II through XII intact PSYCH: Patient very agitated, disheveled  MEDICAL DECISION MAKING: Patient here with altered mental status likely from too much narcotics.  Now he is agitated, tachycardic. He is however hypoxic. I don't think this is enough Narcan to cause him to have pulmonary edema but will check a BNP, chest x-ray. Given his recent history of bacteremia, osteomyelitis, will check rectal temperature. Will obtain labs. Will also obtain a head CT given altered metal status to ensure there is no other abnormality causing his symptoms. Blood glucose here is 197.  ED PROGRESS: Patient's rectal temperature is 102.9. Could sepsis activated. Getting broad-spectrum antibiotics and IV fluids. Patient now  found to be extremely hypertensive. He is alert but mumbling, looking back and forth. Because his mental status is change, we gave another dose of Narcan 0.4 mg. He became more awake, oriented 3. He denies that he is having any pain. Denies headache, chest pain, abdominal pain. Seems to be hallucinating. States he is seeing a wasp flying around the room. Has some nonsensical speech. His urine is very cloudy and appears infected. I suspect that he has a UTI, urosepsis. Because patient is now more agitated after Narcan, we have given more Ativan to keep him calm.  Once he is more calm, will obtain a head CT. Given history of hepatitis, we'll also obtain ammonia level. Cultures are pending.   Patient's lactate is normal. He does have a leukocytosis of 17,000. Creatinine of 3.33 which is new compared to labs and we have in 2015.   4:00  AM  Patient has had to be given large amounts of Ativan to keep him calm as he is thrashing, confused, biting staff. He is now very sedate. Because of concern for airway protection in a patient with sepsis, will intubate. He was extremely hypertensive but has now become hypotensive which may be somewhat secondary to sedation but also secondary to sepsis. We will contact critical care for admission. He has received 3 L of IV fluids. Will give another 2 L of IV fluids and start levophed drip.  5:00 AM  D/w Dr. Elsworth Soho with critical care. They will see the patient in the emergency department and place admission orders. His blood pressure has significant improved on 5 of norepinephrine.  He has been intubated for airway protection. He has received a total 5 L of IV fluids.   EKG Interpretation  Date/Time:  Monday Nov 07 2015 01:40:04 EDT Ventricular Rate:  111 PR Interval:  151 QRS Duration: 114 QT Interval:  303 QTC Calculation: 412 R Axis:   75 Text Interpretation:  Sinus or ectopic atrial tachycardia Ventricular premature complex Incomplete right bundle branch block  Borderline low voltage, extremity leads No significant change since last tracing Confirmed by Tyrene Nader,  DO, Martin Belling [id number]) on 11/07/2015 1:50:04 AM           CRITICAL CARE Performed by: Nyra Jabs   Total critical care time: 60 minutes  Critical care time was exclusive of separately billable procedures and treating other patients.  Critical care was necessary to treat or prevent imminent or life-threatening deterioration.  Critical care was time spent personally by me on the following activities: development of treatment plan with patient and/or surrogate as well as nursing, discussions with consultants,  evaluation of patient's response to treatment, examination of patient, obtaining history from patient or surrogate, ordering and performing treatments and interventions, ordering and review of laboratory studies, ordering and review of radiographic studies, pulse oximetry and re-evaluation of patient's condition.    INTUBATION Performed by: Nyra Jabs  Required items: required blood products, implants, devices, and special equipment available Patient identity confirmed: provided demographic data and hospital-assigned identification number Time out: Immediately prior to procedure a "time out" was called to verify the correct patient, procedure, equipment, support staff and site/side marked as required.  Indications: Airway protection, sepsis   Intubation method: Glidescope Laryngoscopy   Preoxygenation: BVM  Sedatives: 30 mg IV Etomidate Paralytic: 150 mg IV Succinylcholine  Tube Size: 7.5 cuffed  Post-procedure assessment: chest rise and ETCO2 monitor Breath sounds: equal and absent over the epigastrium Tube secured with: ETT holder Chest x-ray interpreted by radiologist and me.  Chest x-ray findings: endotracheal tube in appropriate position  Patient tolerated the procedure well with no immediate complications.    I personally performed the services described  in this documentation, which was scribed in my presence. The recorded information has been reviewed and is accurate.   [place], DO 11/07/15 [phone]

## 2015-11-07 NOTE — Progress Notes (Signed)
Pharmacy Antibiotic Note  Gregory [name] is a 55 y.o. male admitted from [facility] on 11/07/2015 with sepsis.  Pharmacy has been consulted for Vancomycin and Cefepime dosing. Estimated norm CrCl 25 ml/min. Vancomycin 2gm IV and Zosyn 3.375gm IV given in ED ~0230  Plan: Cefepime 1gm IV q24h Vancomycin 1500mg  IV q48h Will f/u micro data, renal function, and pt's clinical condition Vanc trough prn   Height: 6' (182.9 cm) IBW/kg (Calculated) : 77.6  Temp (24hrs), Avg:100.1 F (37.8 C), Min:97.3 F (36.3 C), Max:102.9 F (39.4 C)   Recent Labs Lab 11/07/15 0148 11/07/15 0236 11/07/15 0519  WBC 17.1*  --   --   CREATININE 3.33*  --   --   LATICACIDVEN  --  1.93 0.42*    CrCl cannot be calculated (Unknown ideal weight.).    No Known Allergies  Antimicrobials this admission: 5/22 Vanc >>  5/22 Cefepime >>   Dose adjustments this admission: n/a  Microbiology results: 5/22 BCx x2:  5/22 UCx:    Thank you for allowing pharmacy to be a part of this patient's care.  Sherlon Handing, PharmD, BCPS Clinical pharmacist, pager [phone] 11/07/2015 6:42 AM

## 2015-11-07 NOTE — Progress Notes (Signed)
[place] Progress Note Patient Name: Gregory [name] DOB: Oct 26, 1960 MRN: [id number]   Date of Service  11/07/2015  HPI/Events of Note  Hypotension/Hypovolemia - Patient on Norepinephrine IV infusion to maintain hemodynamics. CVP = 9.  eICU Interventions  Will bolus with 0.9 NaCl 1 liter IV over 1 hour now.      Intervention Category Major Interventions: Hypotension - evaluation and management;Hypovolemia - evaluation and treatment with fluids  Sommer,Steven Eugene 11/07/2015, 9:31 PM

## 2015-11-07 NOTE — Progress Notes (Signed)
Nurse notified of patient having intermittent jerking movements. Patient with forward gaze on my exam and no withdrawal to pain. Seems to have some spontaneous movements. Checking EEG.  Sonia Baller Ashok Cordia, M.D. [facility] Pulmonary & Critical Care Pager:  [phone] After 3pm or if no response, call [phone] 10:11 AM 11/07/2015

## 2015-11-07 NOTE — Progress Notes (Addendum)
Initial Nutrition Assessment  DOCUMENTATION CODES:   Obesity unspecified  INTERVENTION:    Initiate TF via OGT with Vital High Protein at goal rate of 40 ml/h (960 ml per day) with Prostat 30 ml 5 times per day to provide 1460 kcals, 159 gm protein, 803 ml free water daily.  NUTRITION DIAGNOSIS:   Inadequate oral intake related to inability to eat as evidenced by NPO status.  GOAL:   Provide needs based on ASPEN/SCCM guidelines  MONITOR:   Vent status, Labs, Weight trends, TF tolerance, I & O's  REASON FOR ASSESSMENT:   Consult Enteral/tube feeding initiation and management  ASSESSMENT:   55 yo male from NH with acute encephalopathy 2nd to septic shock from UTI, AKI, VDRF, and chronic opiate use. He has hx of MRSA bacteremia with diffuse areas of osteomyelitis in February 2017.  Intubated on admission.  Received MD Consult for TF initiation and management. Labs and medications reviewed. Unable to complete Nutrition-Focused physical exam at this time. Patient being prepped for EEG. Patient is currently intubated on ventilator support Temp (24hrs), Avg:101.3 F (38.5 C), Min:97.3 F (36.3 C), Max:103 F (39.4 C)   Diet Order:  Diet NPO time specified  Skin:  Reviewed, no issues  Last BM:  unknown  Height:   Ht Readings from Last 1 Encounters:  11/07/15 6' (1.829 m)    Weight:   Wt Readings from Last 1 Encounters:  11/07/15 230 lb 6.1 oz (104.5 kg)    Ideal Body Weight:  80.9 kg  BMI:  Body mass index is 31.24 kg/(m^2).  Estimated Nutritional Needs:   Kcal:  [id number]  Protein:  >161 gm  Fluid:  2 L  EDUCATION NEEDS:   No education needs identified at this time  Molli Barrows, [place], [place], [place] Pager [phone] After Hours Pager [phone]

## 2015-11-07 NOTE — Procedures (Signed)
Central Venous Catheter Insertion Procedure Note Gregory [name] [id number] [birth year]  Procedure: Insertion of Central Venous Catheter Indications: Assessment of intravascular volume, Drug and/or fluid administration and Frequent blood sampling  Procedure Details Consent: Risks of procedure as well as the alternatives and risks of each were explained to the (patient/caregiver).  Consent for procedure obtained. Time Out: Verified patient identification, verified procedure, site/side was marked, verified correct patient position, special equipment/implants available, medications/allergies/relevent history reviewed, required imaging and test results available.  Performed  Maximum sterile technique was used including antiseptics, cap, gloves, gown, hand hygiene, mask and sheet. Skin prep: Chlorhexidine; local anesthetic administered A antimicrobial bonded/coated triple lumen catheter was placed in the left internal jugular vein using the Seldinger technique. Ultrasound guidance used.Yes.   Wire visualized in vessel. Catheter placed to 20 cm. Blood aspirated via all 3 ports and then flushed x 3. Line sutured x 2 and dressing applied.  Evaluation Blood flow good Complications: No apparent complications Patient did tolerate procedure well. Chest X-ray ordered to verify placement.  CXR: pending.  Georgann Housekeeper, AGACNP-BC [facility] Pulmonology/Critical Care Pager [phone] or [phone]  11/07/2015 11:42 AM

## 2015-11-07 NOTE — Progress Notes (Signed)
PCCM Attending Note:  Nurse reports increasing requirements for vasopressors and persistent fever despite tylenol. Continuing broad spectrum antibiotics with Vancomycin & Cefepime. Cortisol >20. Has acute renal failure. EEG doesn't show any epileptiform activity. May require further IVF bolus so checking CVP. Applying cooling blanket.   Sonia Baller Ashok Cordia, M.D. [facility] Pulmonary & Critical Care Pager:  [phone] After 3pm or if no response, call [phone] 4:10 PM 11/07/2015

## 2015-11-07 NOTE — Procedures (Signed)
ELECTROENCEPHALOGRAM REPORT  Date of Study: 11/07/2015  Patient's Name: Gregory [name] MRN: [id number] Date of Birth: 01/08/1961  Referring Provider: Tera Partridge, MD  Indication: 55 yo male brought to ER with altered mental status. He resides at [facility]. He was hypoxic, diaphoretic. He was given narcan and then became agitated. He was noted to have fever, low BP. He was intubated for airway protection and sedated. He had purulent appearing urine. He was started on antibiotics, IV fluids, and pressors. Nurse notified of patient having intermittent jerking movements.   Medications: 0.9 % sodium chloride infusion acetaminophen (TYLENOL) solution 650 mg antiseptic oral rinse solution (CORINZ) ceFEPIme (MAXIPIME) 1 g in dextrose 5 % 50 mL IVPB chlorhexidine gluconate (SAGE KIT) (PERIDEX) 0.12 % solution 15 mL enoxaparin (LOVENOX) injection 30 mg feeding supplement (VITAL HIGH PROTEIN) liquid 1,000 mL fentaNYL (SUBLIMAZE) 2,500 mcg in sodium chloride 0.9 % 250 mL (10 mcg/mL) infusion fentaNYL (SUBLIMAZE) bolus via infusion 50 mcg insulin aspart (novoLOG) injection 0-20 Units ipratropium-albuterol (DUONEB) 0.5-2.5 (3) MG/3ML nebulizer solution 3 mL midazolam (VERSED) injection 2 mg naloxone (NARCAN) 0.4 MG/ML injection norepinephrine (LEVOPHED) 4 mg in dextrose 5 % 250 mL (0.016 mg/mL) infusion pantoprazole sodium (PROTONIX) 40 mg/20 mL oral suspension 40 mg tamsulosin (FLOMAX) capsule 0.4 mg vancomycin (VANCOCIN) 1,500 mg in sodium chloride 0.9 % 500 mL IVPB  Technical Summary: This is a multichannel digital EEG recording, using the international 10-20 placement system with electrodes applied with paste and impedances below 5000 ohms.    Description: The EEG background is symmetric, with diffuse slowing of low amplitude 2-3 Hz delta activity and admixed alpha.  No focal abnormalities seen.  No generalized or focal epileptiform discharges seen.  Sedated sleep is  noted.  Hyperventilation and photic stimulation were not performed.  ECG revealed normal cardiac rate and rhythm.  Impression: This EEG is indicative of sedated sleep.  No focal or epileptiform activity is seen.  Adam R. Tomi Likens, DO

## 2015-11-07 NOTE — ED Notes (Signed)
Pt arrives via EMS from [facility]. Facility called EMS because pt became lethargic and unresponsive. Initial O2 was 86% RA. Placed on non-rebreather, 98%. Initital bp 80/40. EMS gave 1  Mg Narcan and pt became alert and combative. Pt yelling and combative upon arrival to ED. Pt takes oxycodone per facility.

## 2015-11-07 NOTE — Progress Notes (Signed)
Patient transported from ED to room 123456 without complications.

## 2015-11-07 NOTE — H&P (Signed)
PULMONARY / CRITICAL CARE MEDICINE   Name: Gregory [name] MRN: [id number] DOB: September 24, 1960    ADMISSION DATE:  11/07/2015  REFERRING MD:  Dr. Leonides Schanz  CHIEF COMPLAINT:  Alerted mental status  HISTORY OF PRESENT ILLNESS:   Hx from chart.  55 yo male brought to ER with altered mental status.  He resides at [facility].  He was hypoxic, diaphoretic.  He was given narcan and then became agitated.  He was given ativan, and became sedated.  He was noted to have fever, low BP.  He was intubated for airway protection.  He had purulent appearing urine.  He was started on antibiotics, IV fluids, and pressors.  He was tx for MRSA bacteremia in February 2017 and multifocal osteomyelitis of Lt foot, Lt shoulder, and spine.  PAST MEDICAL HISTORY :  He  has a past medical history of Diabetes mellitus without complication ([place]); Hypertension; DDD (degenerative disc disease), lumbar; Osteomyelitis ([place]); Urinary retention; COPD (chronic obstructive pulmonary disease) ([place]); Osteomyelitis ([place]); MRSA bacteremia; Chronic back pain; Anemia; Acute respiratory failure ([place]); and Urinary retention.  PAST SURGICAL HISTORY: He  has no past surgical history on file.  No Known Allergies  No current facility-administered medications on file prior to encounter.   Current Outpatient Prescriptions on File Prior to Encounter  Medication Sig  . albuterol (PROVENTIL HFA;VENTOLIN HFA) 108 (90 Base) MCG/ACT inhaler Inhale 2 puffs into the lungs every 6 (six) hours as needed for wheezing or shortness of breath.  . docusate sodium (COLACE) 100 MG capsule Take 100 mg by mouth 2 (two) times daily.  . furosemide (LASIX) 40 MG tablet Take 40 mg by mouth 2 (two) times daily.   Marland Kitchen glimepiride (AMARYL) 2 MG tablet Take 2 mg by mouth daily with breakfast.  . insulin glargine (LANTUS) 100 UNIT/ML injection Inject 40 Units into the skin at bedtime.  . insulin lispro (HUMALOG) 100 UNIT/ML injection Inject 0-12 Units into the skin  daily as needed for high blood sugar.   . lisinopril (PRINIVIL,ZESTRIL) 10 MG tablet Take 10 mg by mouth daily.  . magnesium oxide (MAG-OX) 400 MG tablet Take 400 mg by mouth 2 (two) times daily.  . metoprolol succinate (TOPROL-XL) 50 MG 24 hr tablet Take 50 mg by mouth daily. Take with or immediately following a meal.  . omeprazole (PRILOSEC) 20 MG capsule Take 20 mg by mouth daily.   Marland Kitchen oxyCODONE 30 MG 12 hr tablet Take 90 mg by mouth every 12 (twelve) hours.  . Oxycodone HCl 10 MG TABS Take 10 mg by mouth every 4 (four) hours as needed (pain).   Marland Kitchen PARoxetine (PAXIL) 40 MG tablet Take 40 mg by mouth every morning.  . polyethylene glycol (MIRALAX / GLYCOLAX) packet Take 17 g by mouth daily as needed for mild constipation.  . pregabalin (LYRICA) 150 MG capsule Take 150 mg by mouth 3 (three) times daily.  . tamsulosin (FLOMAX) 0.4 MG CAPS capsule Take 0.4 mg by mouth daily.   Marland Kitchen VICTOZA 18 MG/3ML SOPN Inject 1.2 mg into the skin 2 (two) times daily.     FAMILY HISTORY:  His is negative for Ebola.  SOCIAL HISTORY: He  reports that he has quit smoking. His smoking use included Cigarettes. He smoked 3.00 packs per day. He has never used smokeless tobacco. He reports that he does not drink alcohol or use illicit drugs.  REVIEW OF SYSTEMS:   Unable to obtain  SUBJECTIVE:   VITAL SIGNS: BP 101/56 mmHg  Pulse 70  Temp(Src) 97.3 F (36.3 C) (Rectal)  Resp 15  Ht 6' (1.829 m)  SpO2 98%  HEMODYNAMICS:    VENTILATOR SETTINGS: Vent Mode:  [-] PRVC FiO2 (%):  [40 %-100 %] 40 % Set Rate:  [16 bmp] 16 bmp Vt Set:  RW:212346 mL] 620 mL PEEP:  [5 cmH20] 5 cmH20 Plateau Pressure:  [24 cmH20] 24 cmH20  INTAKE / OUTPUT:    PHYSICAL EXAMINATION: General:  Ill appearing Neuro:  Sedated, paralyzed in ER HEENT:  Pupils reactive, ETT/OG in place Cardiovascular:  Regular, no murmur Lungs:  No wheeze, rales Abdomen:  Soft, non tender Musculoskeletal:  No edema Skin:  No  rashes  LABS:  BMET  Recent Labs Lab 11/07/15 0148  NA 138  K 4.7  CL 99*  CO2 24  BUN 74*  CREATININE 3.33*  GLUCOSE 195*    Electrolytes  Recent Labs Lab 11/07/15 0148  CALCIUM 9.7    CBC  Recent Labs Lab 11/07/15 0148  WBC 17.1*  HGB 9.1*  HCT 30.1*  PLT 257    Coag's No results for input(s): APTT, INR in the last 168 hours.  Sepsis Markers  Recent Labs Lab 11/07/15 0225 11/07/15 0236 11/07/15 0519  LATICACIDVEN  --  1.93 0.42*  PROCALCITON 3.96  --   --     ABG  Recent Labs Lab 11/07/15 0529  PHART 7.318*  PCO2ART 46.6*  PO2ART 432.0*    Liver Enzymes  Recent Labs Lab 11/07/15 0148  AST 19  ALT 14*  ALKPHOS 89  BILITOT 0.9  ALBUMIN 3.2*    Cardiac Enzymes No results for input(s): TROPONINI, PROBNP in the last 168 hours.  Glucose  Recent Labs Lab 11/07/15 0155  GLUCAP 197*    Imaging Ct Head Wo Contrast  11/07/2015  CLINICAL DATA:  Acute onset of lethargy and unresponsiveness. Initial encounter. EXAM: CT HEAD WITHOUT CONTRAST TECHNIQUE: Contiguous axial images were obtained from the base of the skull through the vertex without intravenous contrast. COMPARISON:  CT of the head performed 04/08/2012, and MRI of the brain performed 04/15/2012 FINDINGS: There is no evidence of acute infarction, mass lesion, or intra- or extra-axial hemorrhage on CT. Mild periventricular white matter change likely reflects small vessel ischemic microangiopathy. The posterior fossa, including the cerebellum, brainstem and fourth ventricle, is within normal limits. The third and lateral ventricles, and basal ganglia are unremarkable in appearance. The cerebral hemispheres are symmetric in appearance, with normal gray-white differentiation. No mass effect or midline shift is seen. There is no evidence of fracture; visualized osseous structures are unremarkable in appearance. The orbits are within normal limits. The paranasal sinuses and mastoid air cells  are well-aerated. No significant soft tissue abnormalities are seen. IMPRESSION: 1. No acute intracranial pathology seen on CT. 2. Mild small vessel ischemic microangiopathy. Electronically Signed   By: Garald Balding M.D.   On: 11/07/2015 03:44   Dg Chest Port 1 View  11/07/2015  CLINICAL DATA:  Altered mental status, post intubation. EXAM: PORTABLE CHEST 1 VIEW COMPARISON:  Earlier this day at 0146 hour FINDINGS: Endotracheal tube 3.5 cm from the carina. Tip of the enteric tube below the diaphragm not included in the field of view. Low lung volumes persist. Left basilar atelectasis is unchanged. Cardiomediastinal contours are unchanged allowing for differences in technique. No new airspace disease. IMPRESSION: Endotracheal tube 3.5 cm from the carina.  Enteric tube in place. Persistent low lung volumes and left basilar atelectasis. Electronically Signed   By: Fonnie Birkenhead.D.  On: 11/07/2015 05:09   Dg Chest Port 1 View  11/07/2015  CLINICAL DATA:  Hypoxia.  Patient not responsive. EXAM: PORTABLE CHEST 1 VIEW COMPARISON:  Most recent radiographs 08/08/2015 FINDINGS: Low lung volumes and supine positioning limits assessment. Borderline mild cardiomegaly. No confluent airspace disease. Probable left basilar atelectasis. No large pleural effusion or pneumothorax. IMPRESSION: Hypoventilatory chest with left basilar atelectasis. Evaluation limited by portable supine technique and low lung volumes. Recommend repeat PA and lateral exam when patient is able. Electronically Signed   By: Jeb Levering M.D.   On: 11/07/2015 02:52     STUDIES:  5/22 CT head >> mild small vessel ischemic changes  CULTURES: 5/22 Blood >> 5/22 Urine >>   ANTIBIOTICS: 5/22 Zosyn >> 5/22 5/22 Vancomycin >>  5/22 Cefepime >>   SIGNIFICANT EVENTS: 5/22 Admit  LINES/TUBES: 5/22 ETT >>  DISCUSSION: 55 yo male from NH with acute encephalopathy 2nd to septic shock from UTI, AKI, VDRF, and chronic opiate use.  He has  hx of MRSA bacteremia with diffuse areas of osteomyelitis in February 2017.  ASSESSMENT / PLAN:  PULMONARY A: Acute hypoxic respiratory failure. Hx of COPD. P:   Full vent support F/u CXR, ABG Prn BDs  CARDIOVASCULAR A:  Septic shock 2nd to UTI. Hx of HTN, HLD. P:  Wean pressors to keep MAP > 65 Continue IV fluids Check cortisol Hold outpt lasix, lisinopril, metoprolol  RENAL A:   AKI >> baseline creatinine 0.98 from 2016. Chronic urine retention. P:   F/u BMET Monitor urine outpt Continue flomax  GASTROINTESTINAL A:   Nutrition. P:   Tube feeds Protonix for SUP  HEMATOLOGIC A:   Anemia of critical illness and chronic disease. P:  F/u CBC Lovenox for DVT prevention  INFECTIOUS A:   Septic shock 2nd to UTI. Hx of osteomyelitis with MRSA bacteremia. P:   Day 1 vancomycin, cefepime  ENDOCRINE A:   DM type II. P:   SSI Hold outpt amaryl, lantus, victoza  NEUROLOGIC A:   Acute metabolic encephalopathy. Hx of chronic pain, peripheral neuropathy P:   RASS goal: -1 Hold outpt oxycodone, paxil, lyrica, zanaflex   CC time 48 minutes.  Chesley Mires, MD [facility] Pulmonary/Critical Care 11/07/2015, 6:52 AM Pager:  [phone] After 3pm call: [phone]

## 2015-11-08 ENCOUNTER — Inpatient Hospital Stay (HOSPITAL_COMMUNITY): Payer: Medicaid Other

## 2015-11-08 DIAGNOSIS — R410 Disorientation, unspecified: Secondary | ICD-10-CM

## 2015-11-08 DIAGNOSIS — N179 Acute kidney failure, unspecified: Secondary | ICD-10-CM

## 2015-11-08 DIAGNOSIS — N390 Urinary tract infection, site not specified: Secondary | ICD-10-CM

## 2015-11-08 LAB — COMPREHENSIVE METABOLIC PANEL
ALBUMIN: 2.2 g/dL — AB (ref 3.5–5.0)
ALK PHOS: 74 U/L (ref 38–126)
ALT: 13 U/L — AB (ref 17–63)
ANION GAP: 10 (ref 5–15)
AST: 15 U/L (ref 15–41)
BUN: 43 mg/dL — AB (ref 6–20)
CALCIUM: 8.2 mg/dL — AB (ref 8.9–10.3)
CO2: 21 mmol/L — AB (ref 22–32)
Chloride: 107 mmol/L (ref 101–111)
Creatinine, Ser: 1.82 mg/dL — ABNORMAL HIGH (ref 0.61–1.24)
GFR calc Af Amer: 46 mL/min — ABNORMAL LOW (ref >60)
GFR calc non Af Amer: 40 mL/min — ABNORMAL LOW (ref >60)
GLUCOSE: 215 mg/dL — AB (ref 65–99)
Potassium: 3.9 mmol/L (ref 3.5–5.1)
Sodium: 138 mmol/L (ref 135–145)
Total Bilirubin: 0.6 mg/dL (ref 0.3–1.2)
Total Protein: 6.4 g/dL — ABNORMAL LOW (ref 6.5–8.1)

## 2015-11-08 LAB — GLUCOSE, CAPILLARY
GLUCOSE-CAPILLARY: 204 mg/dL — AB (ref 65–99)
GLUCOSE-CAPILLARY: 229 mg/dL — AB (ref 65–99)
GLUCOSE-CAPILLARY: 122 mg/dL — AB (ref 65–99)
Glucose-Capillary: 158 mg/dL — ABNORMAL HIGH (ref 65–99)
Glucose-Capillary: 229 mg/dL — ABNORMAL HIGH (ref 65–99)

## 2015-11-08 LAB — POCT I-STAT 3, ART BLOOD GAS (G3+)
Acid-base deficit: 4 mmol/L — ABNORMAL HIGH (ref 0.0–2.0)
Bicarbonate: 20.7 mEq/L (ref 20.0–24.0)
O2 Saturation: 98 %
PCO2 ART: 41.6 mmHg (ref 35.0–45.0)
PH ART: 7.317 — AB (ref 7.350–7.450)
PO2 ART: 119 mmHg — AB (ref 80.0–100.0)
Patient temperature: 102.7
TCO2: 22 mmol/L (ref 0–100)

## 2015-11-08 LAB — MAGNESIUM: MAGNESIUM: 1.8 mg/dL (ref 1.7–2.4)

## 2015-11-08 LAB — CBC
HEMATOCRIT: 24.1 % — AB (ref 39.0–52.0)
HEMOGLOBIN: 7.2 g/dL — AB (ref 13.0–17.0)
MCH: 26.3 pg (ref 26.0–34.0)
MCHC: 29.9 g/dL — AB (ref 30.0–36.0)
MCV: 88 fL (ref 78.0–100.0)
Platelets: 207 10*3/uL (ref 150–400)
RBC: 2.74 MIL/uL — ABNORMAL LOW (ref 4.22–5.81)
RDW: 16.9 % — AB (ref 11.5–15.5)
WBC: 10.6 10*3/uL — ABNORMAL HIGH (ref 4.0–10.5)

## 2015-11-08 LAB — PROCALCITONIN: Procalcitonin: 16.32 ng/mL

## 2015-11-08 LAB — PHOSPHORUS: Phosphorus: 3.8 mg/dL (ref 2.5–4.6)

## 2015-11-08 MED ORDER — METOPROLOL TARTRATE 5 MG/5ML IV SOLN
2.5000 mg | Freq: Once | INTRAVENOUS | Status: AC
  Administered 2015-11-08: 2.5 mg via INTRAVENOUS

## 2015-11-08 MED ORDER — HEPARIN SODIUM (PORCINE) 5000 UNIT/ML IJ SOLN
5000.0000 [IU] | Freq: Three times a day (TID) | INTRAMUSCULAR | Status: DC
  Administered 2015-11-09 – 2015-11-25 (×48): 5000 [IU] via SUBCUTANEOUS
  Filled 2015-11-08 (×53): qty 1

## 2015-11-08 MED ORDER — INSULIN GLARGINE 100 UNIT/ML ~~LOC~~ SOLN
10.0000 [IU] | Freq: Every day | SUBCUTANEOUS | Status: DC
  Administered 2015-11-08 – 2015-11-17 (×10): 10 [IU] via SUBCUTANEOUS
  Filled 2015-11-08 (×12): qty 0.1

## 2015-11-08 MED ORDER — METOPROLOL TARTRATE 5 MG/5ML IV SOLN
INTRAVENOUS | Status: AC
Start: 2015-11-08 — End: 2015-11-08
  Filled 2015-11-08: qty 5

## 2015-11-08 MED ORDER — NOREPINEPHRINE BITARTRATE 1 MG/ML IV SOLN
2.0000 ug/min | INTRAVENOUS | Status: DC
  Administered 2015-11-08: 17 ug/min via INTRAVENOUS
  Administered 2015-11-09: 5 ug/min via INTRAVENOUS
  Filled 2015-11-08: qty 16

## 2015-11-08 MED ORDER — MIDAZOLAM BOLUS VIA INFUSION
1.0000 mg | INTRAVENOUS | Status: DC | PRN
  Administered 2015-11-10 – 2015-11-12 (×3): 2 mg via INTRAVENOUS
  Filled 2015-11-08 (×4): qty 2

## 2015-11-08 MED ORDER — MIDAZOLAM HCL 5 MG/ML IJ SOLN
0.0000 mg/h | INTRAMUSCULAR | Status: DC
  Administered 2015-11-08: 2 mg/h via INTRAVENOUS
  Administered 2015-11-09: 5 mg/h via INTRAVENOUS
  Administered 2015-11-09: 3 mg/h via INTRAVENOUS
  Administered 2015-11-10: 8 mg/h via INTRAVENOUS
  Administered 2015-11-10 – 2015-11-11 (×3): 5 mg/h via INTRAVENOUS
  Administered 2015-11-12: 2 mg/h via INTRAVENOUS
  Filled 2015-11-08 (×9): qty 10

## 2015-11-08 NOTE — Progress Notes (Signed)
Inpatient Diabetes Program Recommendations  AACE/ADA: New Consensus Statement on Inpatient Glycemic Control (2015)  Target Ranges:  Prepandial:   less than 140 mg/dL      Peak postprandial:   less than 180 mg/dL (1-2 hours)      Critically ill patients:  140 - 180 mg/dL   Review of Glycemic Control  Results for Gregory [name], Gregory [name] (MRN [id number]) as of 11/08/2015 17:12  Ref. Range 11/07/2015 15:47 11/07/2015 20:48 11/08/2015 00:54 11/08/2015 04:22 11/08/2015 11:06 11/08/2015 15:41  Glucose-Capillary Latest Ref Range: 65-99 mg/dL 211 (H) 224 (H) 229 (H) 204 (H) 229 (H) 158 (H)    Inpatient Diabetes Program Recommendations:    Consider ICU Hyperglycemia Protocol for acceptable glucose control.  Will follow.  Thank you. Lorenda Peck, RD, LDN, CDE Inpatient Diabetes Coordinator [phone]

## 2015-11-08 NOTE — Progress Notes (Signed)
PULMONARY / CRITICAL CARE MEDICINE   Name: Gregory [name] MRN: [id number] DOB: [birth year]    ADMISSION DATE:  11/07/2015  REFERRING MD:  Dr. Leonides Schanz  CHIEF COMPLAINT:  Alerted mental status  HISTORY OF PRESENT ILLNESS:    55 yo male brought to ER with altered mental status.  He resides at [facility].  He was hypoxic, diaphoretic.  He was given narcan and then became agitated.  He was given ativan, and became sedated.  He was noted to have fever, low BP.  He was intubated for airway protection.  He had purulent appearing urine.  He was started on antibiotics, IV fluids, and pressors.  He was tx for MRSA bacteremia in February 2017 and multifocal osteomyelitis of Lt foot, Lt shoulder, and spine.  SUBJECTIVE:  Febrile with worsening shock overnight. Episode of SVT this morning while temp was 105.3. Was given tylenol and cooling blanket. HR improved as temp did. Levo down to 18 mcg.  VITAL SIGNS: BP 91/56 mmHg  Pulse 111  Temp(Src) 101.3 F (38.5 C) (Rectal)  Resp 14  Ht 6' (1.829 m)  Wt 108.1 kg (238 lb 5.1 oz)  BMI 32.31 kg/m2  SpO2 99%  HEMODYNAMICS: CVP:  [9 mmHg-83 mmHg] 80 mmHg  VENTILATOR SETTINGS: Vent Mode:  [-] PRVC FiO2 (%):  [40 %] 40 % Set Rate:  [16 bmp] 16 bmp Vt Set:  [620 mL] 620 mL PEEP:  [5 cmH20] 5 cmH20 Plateau Pressure:  [11 cmH20-17 cmH20] 17 cmH20  INTAKE / OUTPUT: I/O last 3 completed shifts: In: 10368.3 [P.O.:9; I.V.:9786.3; NG/GT:473; IV Piggyback:100] Out: 5725 [Urine:5725]  PHYSICAL EXAMINATION: General:  Acutely ill appearing male on vent Neuro:  Sedated, but follows commands on fent gtt. RASS -2.  HEENT:  Pupils reactive, ETT/OG in place Cardiovascular:  Regular, no murmur Lungs:  No wheeze, rales Abdomen:  Soft, non tender Musculoskeletal:  No edema Skin:  No rashes  LABS:  BMET  Recent Labs Lab 11/07/15 0148 11/08/15 0603  NA 138 138  K 4.7 3.9  CL 99* 107  CO2 24 21*  BUN 74* 43*  CREATININE 3.33* 1.82*  GLUCOSE 195*  215*    Electrolytes  Recent Labs Lab 11/07/15 0148 11/08/15 0603  CALCIUM 9.7 8.2*  MG  --  1.8  PHOS  --  3.8    CBC  Recent Labs Lab 11/07/15 0148 11/08/15 0603  WBC 17.1* 10.6*  HGB 9.1* 7.2*  HCT 30.1* 24.1*  PLT 257 207    Coag's No results for input(s): APTT, INR in the last 168 hours.  Sepsis Markers  Recent Labs Lab 11/07/15 0225 11/07/15 0236 11/07/15 0519 11/08/15 0603  LATICACIDVEN  --  1.93 0.42*  --   PROCALCITON 3.96  --   --  16.32    ABG  Recent Labs Lab 11/07/15 0529 11/08/15 0329  PHART 7.318* 7.317*  PCO2ART 46.6* 41.6  PO2ART 432.0* 119.0*    Liver Enzymes  Recent Labs Lab 11/07/15 0148 11/08/15 0603  AST 19 15  ALT 14* 13*  ALKPHOS 89 74  BILITOT 0.9 0.6  ALBUMIN 3.2* 2.2*    Cardiac Enzymes  Recent Labs Lab 11/07/15 0729  TROPONINI 0.03    Glucose  Recent Labs Lab 11/07/15 1152 11/07/15 1547 11/07/15 2048 11/08/15 0054 11/08/15 0422 11/08/15 1106  GLUCAP 163* 211* 224* 229* 204* 229*    Imaging Dg Chest Port 1 View  11/08/2015  CLINICAL DATA:  Respiratory failure.  Shortness of breath. EXAM: PORTABLE CHEST 1  VIEW COMPARISON:  11/07/2015. FINDINGS: Endotracheal tube, left IJ line, NG tube in stable position. Heart size normal. Low lung volumes with bibasilar atelectasis and/or infiltrates. No pleural effusion or pneumothorax. IMPRESSION: 1. Lines and tubes in stable position. 2.  Low lung volumes with bibasilar atelectasis and/or infiltrates. Electronically Signed   By: Marcello Moores  Register   On: 11/08/2015 07:05     STUDIES:  5/22 CT head >> mild small vessel ischemic changes  CULTURES: 5/22 Blood >> 5/22 Urine >> E. Coli >>  ANTIBIOTICS: 5/22 Zosyn >> 5/22 5/22 Vancomycin >>  5/22 Cefepime >>   SIGNIFICANT EVENTS: 5/22 Admit  LINES/TUBES: 5/22 ETT >>  DISCUSSION: 55 yo male from NH with acute encephalopathy 2nd to septic shock from UTI, AKI, VDRF, and chronic opiate use.  He has hx of  MRSA bacteremia with diffuse areas of osteomyelitis in February 2017. Admitted with UTI and treated with broad spectrum ABX. Septic shock requiring pressors. E.Coli in urine.   ASSESSMENT / PLAN:  PULMONARY A: Acute hypoxic respiratory failure Hx of COPD P:   Full vent support F/u CXR, ABG Prn BDs Defer SBT until improves clinically  CARDIOVASCULAR A:  Septic shock 2nd to UTI SVT > likely fever related Hx of HTN, HLD P:  Wean pressors to keep MAP > 65 CVP monitoring, goal > 10 Continue IV fluids PRN for low CVP Hold outpt lasix, lisinopril, metoprolol  RENAL A:   AKI >> baseline creatinine 0.98  > improving Chronic urine retention Psuedohypocalcemia > Corrects to 9.6 (5/23) P:   F/u BMET Foley Monitor urine outpt Continue flomax  GASTROINTESTINAL A:   Nutrition P:   Tube feeds Protonix for SUP  HEMATOLOGIC A:   Anemia of critical illness and chronic disease > suspect an additional hemodilution component  P:  F/u CBC Lovenox for DVT prevention Transfuse PRBC for HGB < 7  INFECTIOUS A:   Septic shock 2nd to UTI. Hx of osteomyelitis with MRSA bacteremia. P:   ABX as above Likely DC vanc tomorrow if no growth on BC Await sensitivities of UA Trending PCT  ENDOCRINE A:   DM type II. P:   SSI Adding lantus now that on TF and hyperglycemic despite SSI Hold outpt amaryl, lantus, victoza  NEUROLOGIC A:   Acute metabolic encephalopathy. Hx of chronic pain, peripheral neuropathy P:   RASS goal: -1 Fentanyl infusion Hold outpt oxycodone, paxil, lyrica, zanaflex  APP Critical care time 30 min  Georgann Housekeeper, AGACNP-BC [facility] Pulmonology/Critical Care Pager [phone] or [phone]  11/08/2015 12:35 PM  PCCM Attending Note: Patient seen and examined with nurse practitioner. Please refer to his progress note which I reviewed in detail. Shock has resolved from sepsis. Urine culture shows Escherichia coli. Suspect patient is intermittently  bacteremic causing shivering. Continuing to trend Procalcitonin. Switching from Lovenox to heparin subcutaneous for DVT prophylaxis given acute renal failure. Renal failure continuing to improve. Repeating cultures given ongoing fever. Continuing tube feedings.  I have spent a total of 32 minutes of critical care time today caring for the patient and reviewing the patient's electronic medical record.  Sonia Baller Ashok Cordia, M.D. [facility] Pulmonary & Critical Care Pager:  [phone] After 3pm or if no response, call [phone] 4:19 PM 11/08/2015

## 2015-11-09 ENCOUNTER — Inpatient Hospital Stay (HOSPITAL_COMMUNITY): Payer: Medicaid Other

## 2015-11-09 DIAGNOSIS — D649 Anemia, unspecified: Secondary | ICD-10-CM

## 2015-11-09 DIAGNOSIS — B9620 Unspecified Escherichia coli [E. coli] as the cause of diseases classified elsewhere: Secondary | ICD-10-CM

## 2015-11-09 LAB — CBC
HCT: 20.8 % — ABNORMAL LOW (ref 39.0–52.0)
HEMOGLOBIN: 6.2 g/dL — AB (ref 13.0–17.0)
MCH: 25.7 pg — ABNORMAL LOW (ref 26.0–34.0)
MCHC: 29.8 g/dL — ABNORMAL LOW (ref 30.0–36.0)
MCV: 86.3 fL (ref 78.0–100.0)
Platelets: 182 10*3/uL (ref 150–400)
RBC: 2.41 MIL/uL — AB (ref 4.22–5.81)
RDW: 16.9 % — ABNORMAL HIGH (ref 11.5–15.5)
WBC: 7.3 10*3/uL (ref 4.0–10.5)

## 2015-11-09 LAB — HEMOGLOBIN AND HEMATOCRIT, BLOOD
HCT: 21.9 % — ABNORMAL LOW (ref 39.0–52.0)
HEMOGLOBIN: 6.5 g/dL — AB (ref 13.0–17.0)

## 2015-11-09 LAB — URINE CULTURE: Culture: >=100000 — AB

## 2015-11-09 LAB — BASIC METABOLIC PANEL
ANION GAP: 7 (ref 5–15)
BUN: 35 mg/dL — ABNORMAL HIGH (ref 6–20)
CHLORIDE: 113 mmol/L — AB (ref 101–111)
CO2: 22 mmol/L (ref 22–32)
CREATININE: 1.39 mg/dL — AB (ref 0.61–1.24)
Calcium: 8.4 mg/dL — ABNORMAL LOW (ref 8.9–10.3)
GFR calc non Af Amer: 55 mL/min — ABNORMAL LOW (ref >60)
Glucose, Bld: 164 mg/dL — ABNORMAL HIGH (ref 65–99)
POTASSIUM: 3.8 mmol/L (ref 3.5–5.1)
SODIUM: 142 mmol/L (ref 135–145)

## 2015-11-09 LAB — PREPARE RBC (CROSSMATCH)

## 2015-11-09 LAB — PROCALCITONIN: Procalcitonin: 12.57 ng/mL

## 2015-11-09 LAB — MAGNESIUM: MAGNESIUM: 1.7 mg/dL (ref 1.7–2.4)

## 2015-11-09 LAB — GLUCOSE, CAPILLARY
GLUCOSE-CAPILLARY: 113 mg/dL — AB (ref 65–99)
GLUCOSE-CAPILLARY: 145 mg/dL — AB (ref 65–99)
Glucose-Capillary: 182 mg/dL — ABNORMAL HIGH (ref 65–99)
Glucose-Capillary: 158 mg/dL — ABNORMAL HIGH (ref 65–99)
Glucose-Capillary: 116 mg/dL — ABNORMAL HIGH (ref 65–99)
Glucose-Capillary: 146 mg/dL — ABNORMAL HIGH (ref 65–99)

## 2015-11-09 LAB — PHOSPHORUS: PHOSPHORUS: 3.7 mg/dL (ref 2.5–4.6)

## 2015-11-09 LAB — ABO/RH: ABO/RH(D): O POS

## 2015-11-09 MED ORDER — SENNOSIDES 8.8 MG/5ML PO SYRP
5.0000 mL | ORAL_SOLUTION | Freq: Two times a day (BID) | ORAL | Status: DC
  Administered 2015-11-09 – 2015-11-17 (×15): 5 mL
  Filled 2015-11-09 (×17): qty 5

## 2015-11-09 MED ORDER — LACTATED RINGERS IV SOLN
INTRAVENOUS | Status: DC
  Administered 2015-11-09 – 2015-11-19 (×9): via INTRAVENOUS

## 2015-11-09 MED ORDER — MUPIROCIN 2 % EX OINT
1.0000 "application " | TOPICAL_OINTMENT | Freq: Two times a day (BID) | CUTANEOUS | Status: AC
  Administered 2015-11-09 – 2015-11-13 (×10): 1 via NASAL
  Filled 2015-11-09: qty 22

## 2015-11-09 MED ORDER — LACTATED RINGERS IV BOLUS (SEPSIS)
2000.0000 mL | Freq: Once | INTRAVENOUS | Status: AC
  Administered 2015-11-09: 2000 mL via INTRAVENOUS

## 2015-11-09 MED ORDER — SODIUM CHLORIDE 0.9 % IV SOLN
Freq: Once | INTRAVENOUS | Status: AC
  Administered 2015-11-09: 05:00:00 via INTRAVENOUS

## 2015-11-09 MED ORDER — DEXTROSE 5 % IV SOLN
2.0000 g | Freq: Two times a day (BID) | INTRAVENOUS | Status: DC
  Administered 2015-11-09 – 2015-11-10 (×2): 2 g via INTRAVENOUS
  Filled 2015-11-09 (×3): qty 2

## 2015-11-09 MED ORDER — DEXTROSE 5 % IV SOLN
2.0000 g | Freq: Two times a day (BID) | INTRAVENOUS | Status: DC
  Filled 2015-11-09: qty 2

## 2015-11-09 MED ORDER — CHLORHEXIDINE GLUCONATE CLOTH 2 % EX PADS
6.0000 | MEDICATED_PAD | Freq: Every day | CUTANEOUS | Status: AC
  Administered 2015-11-09 – 2015-11-13 (×5): 6 via TOPICAL

## 2015-11-09 NOTE — Progress Notes (Signed)
PULMONARY / CRITICAL CARE MEDICINE   Name: Gregory [name] MRN: [id number] DOB: 08/24/1960    ADMISSION DATE:  11/07/2015  REFERRING MD:  Dr. Leonides Schanz  CHIEF COMPLAINT:  Alerted mental status  HISTORY OF PRESENT ILLNESS:    55 yo male brought to ER with altered mental status.  He resides at [facility].  He was hypoxic, diaphoretic.  He was given narcan and then became agitated.  He was given ativan, and became sedated.  He was noted to have fever, low BP.  He was intubated for airway protection.  He had purulent appearing urine.  He was started on antibiotics, IV fluids, and pressors. He was tx for MRSA bacteremia in February 2017 and multifocal osteomyelitis of Lt foot, Lt shoulder, and spine.  SUBJECTIVE: Patient continuing to have intermittent periods of febrile illness. Now requiring vasopressor support for maintenance of blood pressure.  REVIEW OF SYSTEMS:  Unable to obtain given intubation & sedation.  VITAL SIGNS: BP 125/69 mmHg  Pulse 122  Temp(Src) 98.6 F (37 C) (Oral)  Resp 28  Ht 6' (1.829 m)  Wt 240 lb 8.4 oz (109.1 kg)  BMI 32.61 kg/m2  SpO2 100%  HEMODYNAMICS: CVP:  [12 mmHg-83 mmHg] 12 mmHg  VENTILATOR SETTINGS: Vent Mode:  [-] PRVC FiO2 (%):  [40 %] 40 % Set Rate:  [16 bmp] 16 bmp Vt Set:  [620 mL] 620 mL PEEP:  [5 cmH20] 5 cmH20 Plateau Pressure:  [11 cmH20-17 cmH20] 17 cmH20  INTAKE / OUTPUT: I/O last 3 completed shifts: In: 8456.5 [I.V.:6646.5; NG/GT:1160; IV Piggyback:650] Out: 4085 [Urine:4085]  PHYSICAL EXAMINATION: General:  Elderly male. No distress. Eyes closed. Neuro:  Sedated. Not following commands. Pupils symmetric. HEENT:  Endotracheal tube in place. No scleral icterus or injection. Cardiovascular:  Regular rate. Not able to appreciate JVD. No edema. Lungs:  Clear to auscultation bilaterally. Symmetric chest wall rise on ventilator. Abdomen:  Soft. Protuberant. Normal bowel sounds.  Integument: Warm and dry. No rash on exposed skin.    LABS:  BMET  Recent Labs Lab 11/07/15 0148 11/08/15 0603 11/09/15 0302  NA 138 138 142  K 4.7 3.9 3.8  CL 99* 107 113*  CO2 24 21* 22  BUN 74* 43* 35*  CREATININE 3.33* 1.82* 1.39*  GLUCOSE 195* 215* 164*    Electrolytes  Recent Labs Lab 11/07/15 0148 11/08/15 0603 11/09/15 0302  CALCIUM 9.7 8.2* 8.4*  MG  --  1.8 1.7  PHOS  --  3.8 3.7    CBC  Recent Labs Lab 11/07/15 0148 11/08/15 0603 11/09/15 0302  WBC 17.1* 10.6* 7.3  HGB 9.1* 7.2* 6.2*  HCT 30.1* 24.1* 20.8*  PLT 257 207 182    Coag's No results for input(s): APTT, INR in the last 168 hours.  Sepsis Markers  Recent Labs Lab 11/07/15 0225 11/07/15 0236 11/07/15 0519 11/08/15 0603 11/09/15 0302  LATICACIDVEN  --  1.93 0.42*  --   --   PROCALCITON 3.96  --   --  16.32 12.57    ABG  Recent Labs Lab 11/07/15 0529 11/08/15 0329  PHART 7.318* 7.317*  PCO2ART 46.6* 41.6  PO2ART 432.0* 119.0*    Liver Enzymes  Recent Labs Lab 11/07/15 0148 11/08/15 0603  AST 19 15  ALT 14* 13*  ALKPHOS 89 74  BILITOT 0.9 0.6  ALBUMIN 3.2* 2.2*    Cardiac Enzymes  Recent Labs Lab 11/07/15 0729  TROPONINI 0.03    Glucose  Recent Labs Lab 11/08/15 0721 11/08/15 1106  11/08/15 1541 11/08/15 1930 11/09/15 0025 11/09/15 0336  GLUCAP 182* 229* 158* 122* 113* 145*    Imaging Dg Chest Port 1 View  11/09/2015  CLINICAL DATA:  Intubation.  Shortness of breath. EXAM: PORTABLE CHEST 1 VIEW COMPARISON:  11/08/2015. FINDINGS: Endotracheal tube, left IJ line, NG tube in stable position. Mild cardiomegaly, no pulmonary venous congestion. Low lung volumes with mild bibasilar atelectasis. No pleural effusion or pneumothorax. IMPRESSION: 1. Lines and tubes in stable position. 2. Low lung volumes with mild bibasilar atelectasis. 3. Mild cardiomegaly.  No pulmonary venous congestion. Electronically Signed   By: Marcello Moores  Register   On: 11/09/2015 07:09     STUDIES:  5/22 CT head: mild small vessel  ischemic changes 5/24 Port CXR:  ETT & L IJ CVL in good position. No new focal opacity. 5/24 Port Renal U/S:  No mass or hydronephrosis.  MICROBIOLOGY: 5/22 Blood >> 5/22 Urine:  E coli 5/23 Blood >> 5/23 Urine >>  ANTIBIOTICS: 5/22 Zosyn >> 5/22 5/22 Vancomycin >> 5/24 5/22 Cefepime >>   SIGNIFICANT EVENTS: 5/22 Admit  LINES/TUBES: OETT 7.5 5/22 >> L IJ CVL 5/22 >> OGT 5/22 >> Foley 5/22 >> PIV x2  ASSESSMENT / PLAN:  PULMONARY A: Acute Hypoxic Respiratory Failure H/O COPD  P:   Full vent support Prn BDs SBT & WUA daily  CARDIOVASCULAR A:  Shock - Likely Septic. SVT - Fever related. H/O HTN & HLD  P:  Wean pressors to keep MAP > 65 CVP monitoring, goal > 10 LR 2L bolus now Hold outpt lasix, lisinopril, metoprolol  RENAL A:   Acute Renal Failure - Improving. Baseline Creatinine 0.98. Chronic urine retention Psuedohypocalcemia - Corrects to 9.6 (5/23)  P:   Trending UOP with Foley Monitoring electrolytes & renal function daily Continue flomax LR MIVF  GASTROINTESTINAL A:   Nutrition Constipation  P:   Continue Tube feeds Protonix for SUP Senna BID VT  HEMATOLOGIC A:   Anemia - Worsening. Suspect dilution.  P:  Trending cell counts daily w/ CBC Transfused 1u PRBC this morning Heparin Ponce q8hr SCDs Transfuse PRBC for HGB < 7  INFECTIOUS A:   Sepsis E coli UTI H/O osteomyelitis with MRSA bacteremia  P:   D/C Vancomycin Day #3 of empiric Cefepime Await sensitivities of culture Trending PCT If continues to spike fever will plan for CT abdomen/pelvis  ENDOCRINE A:   DM type II - BG controlled.  P:   Accu-Checks q4hr SSI per resistant algorithm Lantus 10u qhs Hold outpt amaryl, lantus, & victoza  NEUROLOGIC A:   Acute metabolic encephalopathy. Hx of chronic pain, peripheral neuropathy  P:   RASS goal: -1 Fentanyl gtt Versed gtt Hold outpt oxycodone, paxil, lyrica, zanaflex  FAMILY UPDATE:  No family at  bedside.  TODAY'S SUMMARY:  55 yo male from NH with acute encephalopathy 2nd to septic shock from UTI, AKI, VDRF, and chronic opiate use.  He has hx of MRSA bacteremia with diffuse areas of osteomyelitis in February 2017. Admitted with E coli UTI. Patient continuing to have intermittent fevers. Now requiring vasopressor support. Bolus IV fluid. If continues to spike fever consider CT abdomen/pelvis to evaluate for possible abscess.  I have spent a total of 35 minutes of critical care time today caring for the patient and reviewing the patient's electronic medical record.  Sonia Baller Ashok Cordia, M.D. [facility] Pulmonary & Critical Care Pager:  [phone] After 3pm or if no response, call [phone] 7:19 AM 11/09/2015

## 2015-11-09 NOTE — Progress Notes (Signed)
CRITICAL VALUE ALERT  Critical value received:  Hemoglobin 6.2  Date of notification:  11/09/15  Time of notification:  0315  Critical value read back: yes  Nurse who received alert: Deboraha Sprang, RN   MD notified (1st page):  Dr. Elsworth Soho  Time of first page:  [phone]

## 2015-11-09 NOTE — Progress Notes (Signed)
[facility] Progress Note Patient Name: Gregory [name] DOB: 15-Aug-1960 MRN: [id number]   Date of Service  11/09/2015  HPI/Events of Note  Hb 6.2 down from 7.2  eICU Interventions  1 U PRBC     Intervention Category Intermediate Interventions: Diagnostic test evaluation  ALVA,RAKESH V. 11/09/2015, 3:50 AM

## 2015-11-09 NOTE — Progress Notes (Signed)
Pharmacy Antibiotic Note  Gregory [name] is a 55 y.o. male admitted from [facility] on 11/07/2015 with urosepsis. Patient's renal fx has improved significantly since admission. Now with 100 K Ecoli in urine cx. Still with intermittent fevers.  Plan: Adjust cefepime to 2g q12h D/C Vanc Consider de-escalation to cefazolin to complete 1 week of therapy   Height: 6' (182.9 cm) Weight: 240 lb 8.4 oz (109.1 kg) IBW/kg (Calculated) : 77.6  Temp (24hrs), Avg:100.5 F (38.1 C), Min:97.7 F (36.5 C), Max:104 F (40 C)   Recent Labs Lab 11/07/15 0148 11/07/15 0236 11/07/15 0519 11/08/15 0603 11/09/15 0302  WBC 17.1*  --   --  10.6* 7.3  CREATININE 3.33*  --   --  1.82* 1.39*  LATICACIDVEN  --  1.93 0.42*  --   --     Estimated Creatinine Clearance: 75.7 mL/min (by C-G formula based on Cr of 1.39).    No Known Allergies  Antimicrobials this admission: 5/22 Vanc >> 5/24 5/22 Cefepime >>   Dose adjustments this admission: n/a  Microbiology results: 5/22 BCx x2: ngtd 5/22 UCx: > 100k Deeann Saint, PharmD, BCPS, [facility] Clinical Pharmacist Pager [phone] 11/09/2015 12:29 PM

## 2015-11-09 NOTE — Progress Notes (Signed)
Attempted ABG by RT x2. Unsuccessful. Second RT attempted x2 and unsuccessful. Able to get venous blood. RN aware.

## 2015-11-09 NOTE — Progress Notes (Signed)
Patient is from [facility]. CSW following for disposition and transfer back to [facility] when medically stable and cleared for discharge.          Emiliano Dyer, LCSW [facility] ED/60M Clinical Social Worker [phone]

## 2015-11-10 ENCOUNTER — Inpatient Hospital Stay (HOSPITAL_COMMUNITY): Payer: Medicaid Other

## 2015-11-10 ENCOUNTER — Encounter (HOSPITAL_COMMUNITY): Payer: Self-pay | Admitting: Radiology

## 2015-11-10 DIAGNOSIS — L8990 Pressure ulcer of unspecified site, unspecified stage: Secondary | ICD-10-CM | POA: Diagnosis present

## 2015-11-10 LAB — COMPREHENSIVE METABOLIC PANEL
ALBUMIN: 2.1 g/dL — AB (ref 3.5–5.0)
ALT: 18 U/L (ref 17–63)
ANION GAP: 7 (ref 5–15)
AST: 27 U/L (ref 15–41)
Alkaline Phosphatase: 111 U/L (ref 38–126)
BUN: 25 mg/dL — ABNORMAL HIGH (ref 6–20)
CO2: 25 mmol/L (ref 22–32)
Calcium: 9.3 mg/dL (ref 8.9–10.3)
Chloride: 111 mmol/L (ref 101–111)
Creatinine, Ser: 1.23 mg/dL (ref 0.61–1.24)
GFR calc Af Amer: >60 mL/min (ref >60)
GFR calc non Af Amer: >60 mL/min (ref >60)
GLUCOSE: 164 mg/dL — AB (ref 65–99)
POTASSIUM: 3.9 mmol/L (ref 3.5–5.1)
SODIUM: 143 mmol/L (ref 135–145)
TOTAL PROTEIN: 6.4 g/dL — AB (ref 6.5–8.1)
Total Bilirubin: 0.5 mg/dL (ref 0.3–1.2)

## 2015-11-10 LAB — URINE CULTURE: CULTURE: NO GROWTH

## 2015-11-10 LAB — GLUCOSE, CAPILLARY
GLUCOSE-CAPILLARY: 125 mg/dL — AB (ref 65–99)
GLUCOSE-CAPILLARY: 149 mg/dL — AB (ref 65–99)
GLUCOSE-CAPILLARY: 185 mg/dL — AB (ref 65–99)
Glucose-Capillary: 110 mg/dL — ABNORMAL HIGH (ref 65–99)
Glucose-Capillary: 129 mg/dL — ABNORMAL HIGH (ref 65–99)
Glucose-Capillary: 144 mg/dL — ABNORMAL HIGH (ref 65–99)

## 2015-11-10 LAB — CBC
HCT: 26.5 % — ABNORMAL LOW (ref 39.0–52.0)
Hemoglobin: 7.7 g/dL — ABNORMAL LOW (ref 13.0–17.0)
MCH: 25.8 pg — ABNORMAL LOW (ref 26.0–34.0)
MCHC: 29.1 g/dL — AB (ref 30.0–36.0)
MCV: 88.9 fL (ref 78.0–100.0)
Platelets: 204 10*3/uL (ref 150–400)
RBC: 2.98 MIL/uL — ABNORMAL LOW (ref 4.22–5.81)
RDW: 16.9 % — AB (ref 11.5–15.5)
WBC: 7.4 10*3/uL (ref 4.0–10.5)

## 2015-11-10 LAB — URINALYSIS, ROUTINE W REFLEX MICROSCOPIC
BILIRUBIN URINE: NEGATIVE
Glucose, UA: NEGATIVE mg/dL
Ketones, ur: 15 mg/dL — AB
Nitrite: NEGATIVE
PH: 6 (ref 5.0–8.0)
Protein, ur: 100 mg/dL — AB
SPECIFIC GRAVITY, URINE: 1.014 (ref 1.005–1.030)

## 2015-11-10 LAB — URINE MICROSCOPIC-ADD ON: SQUAMOUS EPITHELIAL / LPF: NONE SEEN

## 2015-11-10 MED ORDER — DEXTROSE 5 % IV SOLN
2.0000 g | Freq: Every day | INTRAVENOUS | Status: DC
  Administered 2015-11-10 – 2015-11-11 (×2): 2 g via INTRAVENOUS
  Filled 2015-11-10 (×2): qty 2

## 2015-11-10 NOTE — Progress Notes (Signed)
[facility] Progress Note Patient Name: Elishah Methot DOB: 1961/01/10 MRN: [id number]   Date of Service  11/10/2015  HPI/Events of Note  Request for bilateral soft wrist restraints.  eICU Interventions  Will order bilateral soft wrist restraints.     Intervention Category Minor Interventions: Agitation / anxiety - evaluation and management  Sommer,Steven Cornelia Copa 11/10/2015, 7:58 PM

## 2015-11-10 NOTE — Progress Notes (Signed)
PULMONARY / CRITICAL CARE MEDICINE   Name: Gregory [name] MRN: [id number] DOB: 1961/01/15    ADMISSION DATE:  11/07/2015  REFERRING MD:  Dr. Leonides Schanz  CHIEF COMPLAINT:  Alerted mental status  HISTORY OF PRESENT ILLNESS:    55 yo male brought to ER with altered mental status.  He resides at [facility].  He was hypoxic, diaphoretic.  He was given narcan and then became agitated.  He was given ativan, and became sedated.  He was noted to have fever, low BP.  He was intubated for airway protection.  He had purulent appearing urine.  He was started on antibiotics, IV fluids, and pressors. He was tx for MRSA bacteremia in February 2017 and multifocal osteomyelitis of Lt foot, Lt shoulder, and spine.  SUBJECTIVE: Patient continuing to have intermittent periods of febrile illness. Off vasopressors.  REVIEW OF SYSTEMS:  Unable to obtain given intubation & sedation.  VITAL SIGNS: BP 141/78 mmHg  Pulse 121  Temp(Src) 103 F (39.4 C) (Rectal)  Resp 19  Ht 6' (1.829 m)  Wt 244 lb 7.8 oz (110.9 kg)  BMI 33.15 kg/m2  SpO2 100%  HEMODYNAMICS: CVP:  [1 mmHg-7 mmHg] 1 mmHg  VENTILATOR SETTINGS: Vent Mode:  [-] PRVC FiO2 (%):  [40 %] 40 % Set Rate:  [16 bmp] 16 bmp Vt Set:  [620 mL] 620 mL PEEP:  [5 cmH20] 5 cmH20 Pressure Support:  [5 cmH20] 5 cmH20 Plateau Pressure:  [18 cmH20-22 cmH20] 22 cmH20  INTAKE / OUTPUT: I/O last 3 completed shifts: In: 9082 [I.V.:6719; Blood:283; NG/GT:1430; IV Piggyback:650] Out: [id number] [Urine:5375]  PHYSICAL EXAMINATION: General:  Elderly male. No distress. Eyes closed. Neuro:  Sedated. Pupils symmetric. HEENT:  Endotracheal tube in place. No scleral icterus. OGT significantly out of oropharynx. Cardiovascular:  Regular rate. No JVD. No edema. Lungs:  Clear to auscultation bilaterally. Symmetric chest wall rise on ventilator. Abdomen:  Soft. Protuberant. Normal bowel sounds.  Integument: Warm and dry. No rash on exposed skin.   LABS:  BMET  Recent  Labs Lab 11/08/15 0603 11/09/15 0302 11/10/15 0528  NA 138 142 143  K 3.9 3.8 3.9  CL 107 113* 111  CO2 21* 22 25  BUN 43* 35* 25*  CREATININE 1.82* 1.39* 1.23  GLUCOSE 215* 164* 164*    Electrolytes  Recent Labs Lab 11/08/15 0603 11/09/15 0302 11/10/15 0528  CALCIUM 8.2* 8.4* 9.3  MG 1.8 1.7  --   PHOS 3.8 3.7  --     CBC  Recent Labs Lab 11/08/15 0603 11/09/15 0302 11/09/15 1820 11/10/15 0528  WBC 10.6* 7.3  --  7.4  HGB 7.2* 6.2* 6.5* 7.7*  HCT 24.1* 20.8* 21.9* 26.5*  PLT 207 182  --  204    Coag's No results for input(s): APTT, INR in the last 168 hours.  Sepsis Markers  Recent Labs Lab 11/07/15 0225 11/07/15 0236 11/07/15 0519 11/08/15 0603 11/09/15 0302  LATICACIDVEN  --  1.93 0.42*  --   --   PROCALCITON 3.96  --   --  16.32 12.57    ABG  Recent Labs Lab 11/07/15 0529 11/08/15 0329  PHART 7.318* 7.317*  PCO2ART 46.6* 41.6  PO2ART 432.0* 119.0*    Liver Enzymes  Recent Labs Lab 11/07/15 0148 11/08/15 0603 11/10/15 0528  AST 19 15 27   ALT 14* 13* 18  ALKPHOS 89 74 111  BILITOT 0.9 0.6 0.5  ALBUMIN 3.2* 2.2* 2.1*    Cardiac Enzymes  Recent Labs Lab 11/07/15 [id number]  TROPONINI 0.03    Glucose  Recent Labs Lab 11/09/15 0336 11/09/15 1148 11/09/15 1609 11/09/15 2024 11/10/15 0009 11/10/15 0415  GLUCAP 145* 158* 116* 146* 125* 110*    Imaging US Renal Port  11/09/2015  CLINICAL DATA:  UTI EXAM: RENAL / URINARY TRACT ULTRASOUND COMPLETE COMPARISON:  08/01/2015 FINDINGS: Right Kidney: Length: 15.5 cm. Echogenicity within normal limits. No mass or hydronephrosis visualized. Left Kidney: Length: 16.1 cm. Echogenicity within normal limits. No mass or hydronephrosis visualized. Bladder: Decompressed by Foley catheter. IMPRESSION: Normal kidneys bilaterally. Electronically Signed   By: Inez Catalina M.D.   On: 11/09/2015 11:20   Dg Chest Port 1 View  11/10/2015  CLINICAL DATA:  Respiratory failure. EXAM: PORTABLE CHEST 1  VIEW COMPARISON:  11/09/2015 . FINDINGS: Endotracheal tube, left IJ line, NG tube in stable position. Stable cardiomegaly . Low lung volumes with mild bibasilar atelectasis. No pleural effusion or pneumothorax. IMPRESSION: 1. Lines and tubes in stable position. 2. Low lung volumes with mild bibasilar atelectasis again noted. 3. Stable cardiomegaly. Electronically Signed   By: Marcello Moores  Register   On: 11/10/2015 07:06     STUDIES:  5/22 CT head: mild small vessel ischemic changes 5/24 Port CXR:  ETT & L IJ CVL in good position. No new focal opacity. 5/24 Port Renal U/S:  No mass or hydronephrosis. 5/25 Port CXR:  ETT in good position. OGT with tip in esopaghus. Low lung volumes. 5/25 CT Abd/Pelvis:  No abscess. Normal appendix. Splenomegaly noted. Inflammatory changes adjacent bladder.  MICROBIOLOGY: 5/22 Blood >> 5/22 Urine:  E coli 5/23 Blood >> 5/23 Urine >>  ANTIBIOTICS: 5/22 Zosyn >> 5/22 5/22 Vancomycin >> 5/24 5/22 Cefepime >>   SIGNIFICANT EVENTS: 5/22 Admit  LINES/TUBES: OETT 7.5 5/22 >> L IJ CVL 5/22 >> OGT 5/22 >> Foley 5/22 >> PIV x2  ASSESSMENT / PLAN:  PULMONARY A: Acute Hypoxic Respiratory Failure H/O COPD  P:   Full vent support Prn BDs SBT & WUA daily  CARDIOVASCULAR A:  Shock - Likely Septic. SVT - Fever related. H/O HTN & HLD  P:  Wean pressors to keep MAP > 65 CVP monitoring, goal > 10 Hold outpt lasix, lisinopril, metoprolol  RENAL A:   Acute Renal Failure - Improving. Baseline Creatinine 0.98. Chronic urine retention Psuedohypocalcemia - Corrects to 9.6 (5/23)  P:   Trending UOP with Foley Monitoring electrolytes & renal function daily Continue flomax LR MIVF  GASTROINTESTINAL A:   Nutrition Constipation  P:   Holding Tube feeds Advancing OGT & repeating KUB to confirm placement Protonix for SUP Senna BID VT  HEMATOLOGIC A:   Anemia - Hgb Stable. Suspect dilution. S/P 1u PRBC 5/24.  P:  Trending cell counts daily w/  CBC Heparin [place] q8hr SCDs Transfuse PRBC for HGB < 7  INFECTIOUS A:   Sepsis E coli UTI H/O osteomyelitis with MRSA bacteremia  P:   Day #4 of empiric Cefepime - D/C Cefepime Start Rocephin 2gm IV q24hr MRI Spine Awaiting finalization of cultures  ENDOCRINE A:   DM type II - BG controlled.  P:   Accu-Checks q4hr SSI per resistant algorithm Lantus 10u qhs Hold outpt amaryl, lantus, & victoza  NEUROLOGIC A:   Acute metabolic encephalopathy. Hx of chronic pain, peripheral neuropathy  P:   RASS goal: -1 Fentanyl gtt Versed gtt Hold outpt oxycodone, paxil, lyrica, zanaflex  FAMILY UPDATE:  Daughter updated at bedside 5/25 by Dr. Ashok Cordia.  TODAY'S SUMMARY:  55 yo male from NH with acute  encephalopathy 2nd to septic shock from UTI, AKI, VDRF, and chronic opiate use.  He has hx of MRSA bacteremia with diffuse areas of osteomyelitis in February 2017. Admitted with E coli UTI. Patient continuing to have intermittent fevers. CT abdomen unrevealing for potential abscess source. Checking MRI of cervical, thoracic, and lumbar spine. Antibiotics be escalated to Rocephin given sensitivities to Escherichia coli.  I have spent a total of 34 minutes of critical care time today caring for the patient and reviewing the patient's electronic medical record.  Sonia Baller Ashok Cordia, M.D. [facility] Pulmonary & Critical Care Pager:  [phone] After 3pm or if no response, call [phone] 8:05 AM 11/10/2015

## 2015-11-11 ENCOUNTER — Inpatient Hospital Stay (HOSPITAL_COMMUNITY): Payer: Medicaid Other

## 2015-11-11 DIAGNOSIS — A4151 Sepsis due to Escherichia coli [E. coli]: Secondary | ICD-10-CM

## 2015-11-11 LAB — DIFFERENTIAL
BASOS ABS: 0 10*3/uL (ref 0.0–0.1)
Basophils Relative: 0 %
Eosinophils Absolute: 0.3 10*3/uL (ref 0.0–0.7)
Eosinophils Relative: 6 %
LYMPHS ABS: 1.3 10*3/uL (ref 0.7–4.0)
Lymphocytes Relative: 24 %
MONO ABS: 0.6 10*3/uL (ref 0.1–1.0)
MONOS PCT: 11 %
NEUTROS ABS: 3.2 10*3/uL (ref 1.7–7.7)
Neutrophils Relative %: 59 %

## 2015-11-11 LAB — GLUCOSE, CAPILLARY
GLUCOSE-CAPILLARY: 128 mg/dL — AB (ref 65–99)
GLUCOSE-CAPILLARY: 128 mg/dL — AB (ref 65–99)
GLUCOSE-CAPILLARY: 140 mg/dL — AB (ref 65–99)
GLUCOSE-CAPILLARY: 144 mg/dL — AB (ref 65–99)
GLUCOSE-CAPILLARY: 146 mg/dL — AB (ref 65–99)
GLUCOSE-CAPILLARY: 161 mg/dL — AB (ref 65–99)
Glucose-Capillary: 98 mg/dL (ref 65–99)

## 2015-11-11 LAB — CBC
HCT: 24 % — ABNORMAL LOW (ref 39.0–52.0)
Hemoglobin: 7.1 g/dL — ABNORMAL LOW (ref 13.0–17.0)
MCH: 25.8 pg — AB (ref 26.0–34.0)
MCHC: 29.6 g/dL — AB (ref 30.0–36.0)
MCV: 87.3 fL (ref 78.0–100.0)
PLATELETS: 204 10*3/uL (ref 150–400)
RBC: 2.75 MIL/uL — ABNORMAL LOW (ref 4.22–5.81)
RDW: 16.8 % — ABNORMAL HIGH (ref 11.5–15.5)
WBC: 5.5 10*3/uL (ref 4.0–10.5)

## 2015-11-11 LAB — BASIC METABOLIC PANEL
Anion gap: 7 (ref 5–15)
BUN: 19 mg/dL (ref 6–20)
CO2: 28 mmol/L (ref 22–32)
CREATININE: 1.16 mg/dL (ref 0.61–1.24)
Calcium: 9.4 mg/dL (ref 8.9–10.3)
Chloride: 110 mmol/L (ref 101–111)
GFR calc Af Amer: >60 mL/min (ref >60)
GLUCOSE: 120 mg/dL — AB (ref 65–99)
Potassium: 3.2 mmol/L — ABNORMAL LOW (ref 3.5–5.1)
SODIUM: 145 mmol/L (ref 135–145)

## 2015-11-11 LAB — SEDIMENTATION RATE: Sed Rate: >140 mm/hr — ABNORMAL HIGH (ref 0–16)

## 2015-11-11 LAB — URINE CULTURE
CULTURE: NO GROWTH
SPECIAL REQUESTS: NORMAL

## 2015-11-11 LAB — C-REACTIVE PROTEIN: CRP: 13.7 mg/dL — ABNORMAL HIGH (ref <1.0)

## 2015-11-11 MED ORDER — POTASSIUM CHLORIDE 20 MEQ/15ML (10%) PO SOLN
40.0000 meq | Freq: Once | ORAL | Status: AC
  Administered 2015-11-11: 40 meq
  Filled 2015-11-11: qty 30

## 2015-11-11 MED ORDER — VANCOMYCIN HCL IN DEXTROSE 750-5 MG/150ML-% IV SOLN
750.0000 mg | Freq: Two times a day (BID) | INTRAVENOUS | Status: DC
Start: 2015-11-11 — End: 2015-11-24
  Administered 2015-11-11 – 2015-11-23 (×26): 750 mg via INTRAVENOUS
  Filled 2015-11-11 (×31): qty 150

## 2015-11-11 MED ORDER — DEXMEDETOMIDINE HCL IN NACL 400 MCG/100ML IV SOLN
0.4000 ug/kg/h | INTRAVENOUS | Status: DC
  Administered 2015-11-11: 0.8 ug/kg/h via INTRAVENOUS
  Administered 2015-11-11: 0.4 ug/kg/h via INTRAVENOUS
  Administered 2015-11-11: 0.8 ug/kg/h via INTRAVENOUS
  Administered 2015-11-11: 0.6 ug/kg/h via INTRAVENOUS
  Administered 2015-11-12: 1 ug/kg/h via INTRAVENOUS
  Administered 2015-11-12 – 2015-11-13 (×4): 0.4 ug/kg/h via INTRAVENOUS
  Administered 2015-11-14: 0.8 ug/kg/h via INTRAVENOUS
  Administered 2015-11-14: 0.5 ug/kg/h via INTRAVENOUS
  Filled 2015-11-11 (×3): qty 100
  Filled 2015-11-11 (×2): qty 50
  Filled 2015-11-11 (×6): qty 100

## 2015-11-11 NOTE — Consult Note (Signed)
CC:  Chief Complaint  Patient presents with  . Altered Mental Status    HPI: Gregory [name] is a 55 y.o. male admitted to the ICU with sepsis. He is currently intubated and sedated, unable to provide history. History is therefore obtained from the EMR and bedside RN. He has a history of chronic osteomyelitis involving cervical spine, lumbar spine, left shoulder, and left foot. He was on abx from initial diagnosis in Feb 2017 until May 1. He re-presented to [facility] about 5 days ago with altered mental status. He was found to have E Coli UTI and sepsis. Required intubation. MRI C/T/L spine was done to identify possible source of continued fevers.  PMH: Past Medical History  Diagnosis Date  . Diabetes mellitus without complication ([place])   . Hypertension   . DDD (degenerative disc disease), lumbar   . Osteomyelitis ([place])   . Urinary retention   . COPD (chronic obstructive pulmonary disease) ([place])   . Osteomyelitis ([place])   . MRSA bacteremia   . Chronic back pain   . Anemia   . Acute respiratory failure ([place])   . Urinary retention     PSH: History reviewed. No pertinent past surgical history.  SH: Social History  Substance Use Topics  . Smoking status: Former Smoker -- 3.00 packs/day    Types: Cigarettes  . Smokeless tobacco: Never Used  . Alcohol Use: No    MEDS: Prior to Admission medications   Medication Sig Start Date End Date Taking? Authorizing Provider  acetaminophen (TYLENOL) 325 MG tablet Take 650 mg by mouth every 6 (six) hours as needed for mild pain.   Yes Historical Provider, MD  albuterol (PROVENTIL HFA;VENTOLIN HFA) 108 (90 Base) MCG/ACT inhaler Inhale 2 puffs into the lungs every 6 (six) hours as needed for wheezing or shortness of breath.   Yes Historical Provider, MD  Amino Acids-Protein Hydrolys (FEEDING SUPPLEMENT, PRO-STAT SUGAR FREE 64,) LIQD Take 30 mLs by mouth 3 (three) times daily with meals.   Yes Historical Provider, MD  docusate sodium (COLACE) 100 MG  capsule Take 100 mg by mouth 2 (two) times daily.   Yes Historical Provider, MD  furosemide (LASIX) 40 MG tablet Take 40 mg by mouth 2 (two) times daily.  11/07/13  Yes Historical Provider, MD  glimepiride (AMARYL) 2 MG tablet Take 2 mg by mouth daily with breakfast.   Yes Historical Provider, MD  glucagon, human recombinant, (GLUCAGEN) 1 MG injection Inject 1 mg into the vein once as needed for low blood sugar.   Yes Historical Provider, MD  insulin glargine (LANTUS) 100 UNIT/ML injection Inject 40 Units into the skin at bedtime.   Yes Historical Provider, MD  insulin lispro (HUMALOG) 100 UNIT/ML injection Inject 0-12 Units into the skin daily as needed for high blood sugar.  08/29/15  Yes Historical Provider, MD  lisinopril (PRINIVIL,ZESTRIL) 10 MG tablet Take 10 mg by mouth daily.   Yes Historical Provider, MD  magnesium oxide (MAG-OX) 400 MG tablet Take 400 mg by mouth 2 (two) times daily.   Yes Historical Provider, MD  metoprolol succinate (TOPROL-XL) 50 MG 24 hr tablet Take 50 mg by mouth daily. Take with or immediately following a meal.   Yes Historical Provider, MD  Multiple Vitamins-Minerals (DECUBI-VITE PO) Take 1 tablet by mouth daily.   Yes Historical Provider, MD  omeprazole (PRILOSEC) 20 MG capsule Take 20 mg by mouth daily.  11/09/13  Yes Historical Provider, MD  oxyCODONE 30 MG 12 hr tablet Take 90 mg  by mouth every 12 (twelve) hours.   Yes Historical Provider, MD  Oxycodone HCl 10 MG TABS Take 10 mg by mouth every 4 (four) hours as needed (pain).    Yes Historical Provider, MD  PARoxetine (PAXIL) 40 MG tablet Take 40 mg by mouth every morning.   Yes Historical Provider, MD  polyethylene glycol (MIRALAX / GLYCOLAX) packet Take 17 g by mouth daily as needed for mild constipation.   Yes Historical Provider, MD  pregabalin (LYRICA) 150 MG capsule Take 150 mg by mouth 3 (three) times daily.   Yes Historical Provider, MD  tamsulosin (FLOMAX) 0.4 MG CAPS capsule Take 0.4 mg by mouth daily.   11/26/13  Yes Historical Provider, MD  tiZANidine (ZANAFLEX) 4 MG tablet Take 4 mg by mouth every 6 (six) hours as needed for muscle spasms.   Yes Historical Provider, MD  VICTOZA 18 MG/3ML SOPN Inject 1.2 mg into the skin 2 (two) times daily.  11/23/13  Yes Historical Provider, MD    ALLERGY: No Known Allergies  ROS: ROS  NEUROLOGIC EXAM: Intubated, sedated Opens eyes to voice Breathing over vent Moves BUE/BLE at least antigravity, symmetrically  IMGAING: MRI Cspine demonstrates destruction of the C5-6 disc space with some destruction of the inferior C5 body. This leads to progressive kyphotic deformity as compared to prior MRI. There is resultant severe spinal stenosis with associated T2 signal change within the cord. There is minimal epidural abscess.  MRI L spine demonstrates right L4-5 facet arthropathy which could be degenerative vs septic arthritis. There is multifactorial degenerative stenosis at L3-4 and L4-5.  IMPRESSION: - 55 y.o. male with cervical discitis/osteomyelitis. 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 While this will require operative reconstruction likely from a combined anterior C5,C6 corpectomy and posterior C4-C7 stabilization, would defer this large operation until the patient has recovered from his current condition somewhat.  PLAN: - Would place him in an Aspen collar in an attempt to stabilize his kyphosis for now - Cont mgmt per PCCM and ID.  - Will need surgery once he starts to improve medically.

## 2015-11-11 NOTE — Progress Notes (Signed)
Nutrition Follow-up  DOCUMENTATION CODES:   Obesity unspecified  INTERVENTION:    Continue Vital High Protein at goal rate of 40 ml/h (960 ml per day) with Prostat 30 ml 5 times per day to provide 1460 kcals, 159 gm protein, 803 ml free water daily.  NUTRITION DIAGNOSIS:   Inadequate oral intake related to inability to eat as evidenced by NPO status.  Ongoing  GOAL:   Provide needs based on ASPEN/SCCM guidelines  Met  MONITOR:   Vent status, Labs, Weight trends, TF tolerance, I & O's  ASSESSMENT:   55 yo male from NH with acute encephalopathy 2nd to septic shock from UTI, AKI, VDRF, and chronic opiate use. He has hx of MRSA bacteremia with diffuse areas of osteomyelitis in February 2017.  Discussed patient with RN today. No difficulty tolerating TF. No BM documented since admission. Nutrition-Focused physical exam completed. Findings are no fat depletion, no muscle depletion, and no edema.  S/P ID consult today, antibiotics changed for persistent MRSA bacteremia.  Patient remains intubated on ventilator support Temp (24hrs), Avg:100.2 F (37.9 C), Min:98.4 F (36.9 C), Max:103 F (39.4 C)   Diet Order:  Diet NPO time specified  Skin:  Wound (see comment) (stage 1 pressure injury to buttocks)  Last BM:  unknown  Height:   Ht Readings from Last 1 Encounters:  11/07/15 6' (1.829 m)    Weight:   Wt Readings from Last 1 Encounters:  11/11/15 222 lb 11.2 oz (101.016 kg)  Admission weight: 230 lb 6.1 oz (104.5 kg)  Ideal Body Weight:  80.9 kg  BMI:  Body mass index is 30.2 kg/(m^2).  Estimated Nutritional Needs:   Kcal:  3832-9191  Protein:  >161 gm  Fluid:  2 L  EDUCATION NEEDS:   No education needs identified at this time  Molli Barrows, [place], [place], [place] Pager [phone] After Hours Pager [phone]

## 2015-11-11 NOTE — Progress Notes (Signed)
Pharmacy Antibiotic Note  Gregory [name] is a 55 y.o. male admitted from SNF on 11/07/2015 with urosepsis. Now found to have epidural abscess on MRI - pt has hx of MRSA Osteo  Plan: Continue cefepime to 2g q12h Add vancomycin 750 mg q12h (goal tr 15 - 20 mcg/mL)   Height: 6' (182.9 cm) Weight: 222 lb 11.2 oz (101.016 kg) IBW/kg (Calculated) : 77.6  Temp (24hrs), Avg:100.3 F (37.9 C), Min:98.4 F (36.9 C), Max:103 F (39.4 C)   Recent Labs Lab 11/07/15 0148 11/07/15 0236 11/07/15 0519 11/08/15 0603 11/09/15 0302 11/10/15 0528 11/11/15 0620  WBC 17.1*  --   --  10.6* 7.3 7.4 5.5  CREATININE 3.33*  --   --  1.82* 1.39* 1.23 1.16  LATICACIDVEN  --  1.93 0.42*  --   --   --   --     Estimated Creatinine Clearance: 87.5 mL/min (by C-G formula based on Cr of 1.16).    No Known Allergies  Antimicrobials this admission: 5/22 Vanc >> 5/24; 5/26>> 5/22 Cefepime >> 5/25 5/25 CTX>>  Dose adjustments this admission: n/a  Microbiology results: 5/22 BCx x2: ngtd 5/22 UCx: > 100k Deeann Saint, PharmD, BCPS, [facility] Clinical Pharmacist Pager [phone] 11/11/2015 8:53 AM

## 2015-11-11 NOTE — Progress Notes (Addendum)
PULMONARY / CRITICAL CARE MEDICINE   Name: Gregory [name] MRN: [id number] DOB: 1961-06-16    ADMISSION DATE:  11/07/2015  REFERRING MD:  Dr. Leonides Schanz  CHIEF COMPLAINT:  Alerted mental status  HISTORY OF PRESENT ILLNESS:    55 yo male brought to ER with altered mental status.  He resides at [facility].  He was hypoxic, diaphoretic.  He was given narcan and then became agitated.  He was given ativan, and became sedated.  He was noted to have fever, low BP.  He was intubated for airway protection.  He had purulent appearing urine.  He was started on antibiotics, IV fluids, and pressors. He was tx for MRSA bacteremia in February 2017 and multifocal osteomyelitis of Lt foot, Lt shoulder, and spine.  SUBJECTIVE: No acute events overnight. More awake this morning on sedation vacation but not following commands.  REVIEW OF SYSTEMS:  Unable to obtain given intubation & sedation.  VITAL SIGNS: BP 97/62 mmHg  Pulse 93  Temp(Src) 99.6 F (37.6 C) (Rectal)  Resp 23  Ht 6' (1.829 m)  Wt 222 lb 11.2 oz (101.016 kg)  BMI 30.20 kg/m2  SpO2 100%  HEMODYNAMICS: CVP:  [2 mmHg-5 mmHg] 2 mmHg  VENTILATOR SETTINGS: Vent Mode:  [-] PSV;CPAP FiO2 (%):  [40 %] 40 % Set Rate:  [16 bmp] 16 bmp Vt Set:  [620 mL] 620 mL PEEP:  [5 cmH20] 5 cmH20 Pressure Support:  [5 cmH20-10 cmH20] 5 cmH20 Plateau Pressure:  [10 cmH20-27 cmH20] 10 cmH20  INTAKE / OUTPUT: I/O last 3 completed shifts: In: 5302.7 [I.V.:3992.7; NG/GT:1210; IV Piggyback:100] Out: 6175 [Urine:6175]  PHYSICAL EXAMINATION: General:  Elderly male. Eyes open. Uncomfortable on sedation vacation. Neuro:  Not following commands. Moving all 4 extremities spontaneously. Tracks to voice. HEENT:  Endotracheal tube in place. No scleral icterus. OGT significantly out of oropharynx. Cardiovascular:  Tachycardic. No JVD. No edema. Lungs:  Clear to auscultation bilaterally. Symmetric chest wall rise on ventilator. Abdomen:  Soft. Protuberant. Normal  bowel sounds.  Integument: Warm and dry. No rash on exposed skin.   LABS:  BMET  Recent Labs Lab 11/09/15 0302 11/10/15 0528 11/11/15 0620  NA 142 143 145  K 3.8 3.9 3.2*  CL 113* 111 110  CO2 22 25 28   BUN 35* 25* 19  CREATININE 1.39* 1.23 1.16  GLUCOSE 164* 164* 120*    Electrolytes  Recent Labs Lab 11/08/15 0603 11/09/15 0302 11/10/15 0528 11/11/15 0620  CALCIUM 8.2* 8.4* 9.3 9.4  MG 1.8 1.7  --   --   PHOS 3.8 3.7  --   --     CBC  Recent Labs Lab 11/09/15 0302 11/09/15 1820 11/10/15 0528 11/11/15 0620  WBC 7.3  --  7.4 5.5  HGB 6.2* 6.5* 7.7* 7.1*  HCT 20.8* 21.9* 26.5* 24.0*  PLT 182  --  204 204    Coag's No results for input(s): APTT, INR in the last 168 hours.  Sepsis Markers  Recent Labs Lab 11/07/15 0225 11/07/15 0236 11/07/15 0519 11/08/15 0603 11/09/15 0302  LATICACIDVEN  --  1.93 0.42*  --   --   PROCALCITON 3.96  --   --  16.32 12.57    ABG  Recent Labs Lab 11/07/15 0529 11/08/15 0329  PHART 7.318* 7.317*  PCO2ART 46.6* 41.6  PO2ART 432.0* 119.0*    Liver Enzymes  Recent Labs Lab 11/07/15 0148 11/08/15 0603 11/10/15 0528  AST 19 15 27   ALT 14* 13* 18  ALKPHOS 89 74  111  BILITOT 0.9 0.6 0.5  ALBUMIN 3.2* 2.2* 2.1*    Cardiac Enzymes  Recent Labs Lab 11/07/15 0729  TROPONINI 0.03    Glucose  Recent Labs Lab 11/10/15 0740 11/10/15 1212 11/10/15 1214 11/10/15 1613 11/10/15 2033 11/10/15 2318  GLUCAP 129* >600* 149* 185* 144* 98    Imaging Mr Cervical Spine W Wo Contrast  11/11/2015  CLINICAL DATA:  Urinary tract infection, persistent fever. History of MRSA osteomyelitis and bacteremia, diabetes, hypertension. EXAM: MRI CERVICAL SPINE WITHOUT AND WITH CONTRAST TECHNIQUE: Multiplanar and multiecho pulse sequences of the cervical spine, to include the craniocervical junction and cervicothoracic junction, were obtained according to standard protocol without and with intravenous contrast. Patient was  unable to tolerate further imaging, and post gadolinium axial sequences not obtained. CONTRAST:  20 cc MultiHance COMPARISON:  None. FINDINGS: ALIGNMENT: Focal kyphosis C5-6.  No malalignment. VERTEBRAE/DISCS: Destructive C5-6 endplate changes and mild height loss associated with obliterated disc and effusion, associated abnormal enhancement within the C5-6 endplates and disc. The remaining intervertebral discs demonstrate relatively preserved morphology and signal. Mild chronic discogenic endplate changes at multiple levels. No STIR signal abnormality to suggest fracture. CORD:Mild T2 bright cord edema C4-5 through C6-7 without syrinx. No abnormal cord enhancement. POSTERIOR FOSSA, VERTEBRAL ARTERIES, PARASPINAL TISSUES: Minimal dorsal dural enhancement at C5-6 is well as trace is ventral epidural fluid collection at C5-6 compatible with abscess. Enhancing prevertebral phlegmon. Enhancing bright STIR signal within the C5-6 interspinous space extending to the paraspinal soft tissues compatible with myositis. Life-support lines in place. Vertebral artery flow voids present. DISC LEVELS: C2-3: Uncovertebral hypertrophy without canal stenosis. Mild RIGHT neural foraminal narrowing. C3-4: Small RIGHT central to subarticular disc protrusion. Uncovertebral hypertrophy. Mild canal stenosis. Moderate to severe RIGHT and moderate LEFT neural foraminal narrowing. C4-5: Annular bulging, uncovertebral hypertrophy and tiny central disc protrusion. Mild canal stenosis. Moderate RIGHT neural foraminal narrowing. C5-6: Retropulsed disc material and uncal vertebral hypertrophy results in severe canal stenosis: AP dimension of the canal is 6 mm. Severe bilateral neural foraminal narrowing. C6-7: Small broad-based disc bulge, uncovertebral hypertrophy resulting in moderate canal stenosis. Severe bilateral neural foraminal narrowing. C7-T1: Small LEFT subarticular disc protrusion. No canal stenosis. Moderate to severe LEFT neural  foraminal narrowing. 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 Associated paraspinal muscle edema/myositis. Severe canal stenosis at C5-6. C4-5 through C6-7 spinal cord edema/pre syrinx. No abnormal enhancement of the spinal cord. Moderate canal stenosis at C6-7, mild at C3-4 and C4-5. Multilevel neural foraminal narrowing: Severe at C5-6 and C6-7. Electronically Signed   By: Elon Alas M.D.   On: 11/11/2015 05:51   Mr Thoracic Spine W Wo Contrast  11/11/2015  CLINICAL DATA:  Urinary tract infection, persistent fever. History of MRSA osteomyelitis and bacteremia, diabetes, hypertension. EXAM: MRI THORACIC AND LUMBAR SPINE WITHOUT AND WITH CONTRAST TECHNIQUE: Multiplanar and multiecho pulse sequences of the thoracic and lumbar spine were obtained without and with intravenous contrast. CONTRAST:  20 cc MultiHance COMPARISON:  CT abdomen and pelvis Nov 10, 2015 FINDINGS: MRI THORACIC SPINE FINDINGS (marked artifact, possibly related life-support lines in 3 tesla environment) ALIGNMENT: Maintenance of the thoracic kyphosis. No malalignment. VERTEBRAE/DISCS: Vertebral bodies are intact. Intervertebral disc morphology's and signal are normal. No abnormal osseous or intradiscal enhancement. CORD: Thoracic spinal cord is normal morphology and signal characteristics to the level the conus medullaris which terminates at T12-L1. PREVERTEBRAL AND PARASPINAL SOFT TISSUES: Small pleural effusions and enhancing probable atelectasis or, possible pneumonia. DEGENERATIVE CHANGE: LEFT central  T7-8 moderate disc protrusion resulting in mild canal stenosis. Limited assessment of neural foraminal narrowing due to poor image quality. MRI LUMBAR SPINE FINDINGS (low signal to noise ratio, further degraded by mild patient motion.) OSSEOUS STRUCTURES: Lumbar vertebral bodies intact. Grade 1 L4-5 anterolisthesis associated with bright STIR signal and enhancing bone marrow signal within the  RIGHT L4-5 facet. Mild congenital canal narrowing on the basis of foreshortened pedicles. Mild L3-4, moderate L4-5 and mild L5-S1 disc height loss with decreased T2 signal within the lower lumbar disc compatible with mild desiccation. Mild acute on chronic discogenic endplate changes at 075-GRM. No STIR signal abnormality to suggest fracture. No vertebral body or intradiscal enhancement. SPINAL CORD: Conus medullaris terminates at T12 and appears normal morphology and signal characteristics. Central displacement of the cauda equina due to canal stenosis. No abnormal cord, leptomeningeal enhancement. SOFT TISSUES: Bright STIR signal versus artifact within the RIGHT psoas muscle. Paraspinal bright interstitial STIR signal. LEVEL BY LEVEL EVALUATION: L1-2: Annular bulging without canal stenosis or neural foraminal narrowing. L2-3: Annular bulging. Mild facet arthropathy and ligamentum flavum redundancy without canal stenosis. Mild LEFT neural foraminal narrowing. L3-4: Large 5 mm broad-based disc bulge. Moderate facet arthropathy and ligamentum flavum redundancy. Severe canal stenosis. Moderate to severe bilateral neural foraminal narrowing. L4-5: Anterolisthesis. Moderate broad-based disc bulge. Severe facet arthropathy and ligamentum flavum redundancy with enhancing RIGHT facet. Severe canal stenosis. Severe RIGHT greater than LEFT neural foraminal narrowing. Abnormal signal within the RIGHT L4-5 lateral recess extend toward the RIGHT L5-S1 neural foramen, contiguous with the RIGHT facet with superimposed enhancement. 6 mm RIGHT facet synovial cyst, less likely abscess. Trace component extensive RIGHT ligamentum flavum. Bright STIR signal within the L4-5 interspinous space. L5-S1: Small broad-based disc bulge eccentric laterally. Mild facet arthropathy and ligamentum flavum redundancy without canal stenosis. Moderate RIGHT, mild LEFT neural foraminal narrowing. IMPRESSION: MRI THORACIC SPINE: No MR findings of infection  within the thoracic spine. Moderate LEFT central T7-8 disc protrusion resulting in mild canal stenosis. MRI LUMBAR SPINE: Grade 1 L4-5 anterolisthesis with severe L4-5 RIGHT facet arthropathy, inflammation versus infectious bone marrow changes and facet cyst versus abscess. Possible RIGHT psoas muscle myositis. Severe canal stenosis L3-4 and L4-5. Severe L4-5 and moderate to severe L3-4 neural foraminal narrowing. Electronically Signed   By: Elon Alas M.D.   On: 11/11/2015 06:13   Mr Lumbar Spine W Wo Contrast  11/11/2015  CLINICAL DATA:  Urinary tract infection, persistent fever. History of MRSA osteomyelitis and bacteremia, diabetes, hypertension. EXAM: MRI THORACIC AND LUMBAR SPINE WITHOUT AND WITH CONTRAST TECHNIQUE: Multiplanar and multiecho pulse sequences of the thoracic and lumbar spine were obtained without and with intravenous contrast. CONTRAST:  20 cc MultiHance COMPARISON:  CT abdomen and pelvis Nov 10, 2015 FINDINGS: MRI THORACIC SPINE FINDINGS (marked artifact, possibly related life-support lines in 3 tesla environment) ALIGNMENT: Maintenance of the thoracic kyphosis. No malalignment. VERTEBRAE/DISCS: Vertebral bodies are intact. Intervertebral disc morphology's and signal are normal. No abnormal osseous or intradiscal enhancement. CORD: Thoracic spinal cord is normal morphology and signal characteristics to the level the conus medullaris which terminates at T12-L1. PREVERTEBRAL AND PARASPINAL SOFT TISSUES: Small pleural effusions and enhancing probable atelectasis or, possible pneumonia. DEGENERATIVE CHANGE: LEFT central T7-8 moderate disc protrusion resulting in mild canal stenosis. Limited assessment of neural foraminal narrowing due to poor image quality. MRI LUMBAR SPINE FINDINGS (low signal to noise ratio, further degraded by mild patient motion.) OSSEOUS STRUCTURES: Lumbar vertebral bodies intact. Grade 1 L4-5 anterolisthesis associated with bright STIR signal  and enhancing bone  marrow signal within the RIGHT L4-5 facet. Mild congenital canal narrowing on the basis of foreshortened pedicles. Mild L3-4, moderate L4-5 and mild L5-S1 disc height loss with decreased T2 signal within the lower lumbar disc compatible with mild desiccation. Mild acute on chronic discogenic endplate changes at 075-GRM. No STIR signal abnormality to suggest fracture. No vertebral body or intradiscal enhancement. SPINAL CORD: Conus medullaris terminates at T12 and appears normal morphology and signal characteristics. Central displacement of the cauda equina due to canal stenosis. No abnormal cord, leptomeningeal enhancement. SOFT TISSUES: Bright STIR signal versus artifact within the RIGHT psoas muscle. Paraspinal bright interstitial STIR signal. LEVEL BY LEVEL EVALUATION: L1-2: Annular bulging without canal stenosis or neural foraminal narrowing. L2-3: Annular bulging. Mild facet arthropathy and ligamentum flavum redundancy without canal stenosis. Mild LEFT neural foraminal narrowing. L3-4: Large 5 mm broad-based disc bulge. Moderate facet arthropathy and ligamentum flavum redundancy. Severe canal stenosis. Moderate to severe bilateral neural foraminal narrowing. L4-5: Anterolisthesis. Moderate broad-based disc bulge. Severe facet arthropathy and ligamentum flavum redundancy with enhancing RIGHT facet. Severe canal stenosis. Severe RIGHT greater than LEFT neural foraminal narrowing. Abnormal signal within the RIGHT L4-5 lateral recess extend toward the RIGHT L5-S1 neural foramen, contiguous with the RIGHT facet with superimposed enhancement. 6 mm RIGHT facet synovial cyst, less likely abscess. Trace component extensive RIGHT ligamentum flavum. Bright STIR signal within the L4-5 interspinous space. L5-S1: Small broad-based disc bulge eccentric laterally. Mild facet arthropathy and ligamentum flavum redundancy without canal stenosis. Moderate RIGHT, mild LEFT neural foraminal narrowing. IMPRESSION: MRI THORACIC SPINE: No  MR findings of infection within the thoracic spine. Moderate LEFT central T7-8 disc protrusion resulting in mild canal stenosis. MRI LUMBAR SPINE: Grade 1 L4-5 anterolisthesis with severe L4-5 RIGHT facet arthropathy, inflammation versus infectious bone marrow changes and facet cyst versus abscess. Possible RIGHT psoas muscle myositis. Severe canal stenosis L3-4 and L4-5. Severe L4-5 and moderate to severe L3-4 neural foraminal narrowing. Electronically Signed   By: Elon Alas M.D.   On: 11/11/2015 06:13   Dg Abd Portable 1v  11/10/2015  CLINICAL DATA:  Orogastric tube placement EXAM: PORTABLE ABDOMEN - 1 VIEW COMPARISON:  None. FINDINGS: The there is normal small bowel gas pattern. There is NG tube coiled within proximal stomach with tip in gastric fundus. Moderate gas and stool noted within transverse colon and proximal left colon. IMPRESSION: NG tube coiled within proximal stomach with tip in gastric fundus. Moderate stool and gas noted in visualized colon. Electronically Signed   By: Lahoma Crocker M.D.   On: 11/10/2015 11:28     STUDIES:  5/22 CT head: mild small vessel ischemic changes 5/24 Port CXR:  ETT & L IJ CVL in good position. No new focal opacity. 5/24 Port Renal U/S:  No mass or hydronephrosis. 5/25 Port CXR:  ETT in good position. OGT with tip in esopaghus. Low lung volumes. 5/25 CT Abd/Pelvis:  No abscess. Normal appendix. Splenomegaly noted. Inflammatory changes adjacent bladder. 5/26 MRI Spine:  C5-6 discitis osteomyelitis w/ trace epidural abscess. L4-5 facet cyst vs abscess  MICROBIOLOGY: 5/22 Blood >> 5/22 Urine:  E coli 5/23 Blood >> 5/23 Urine:  Negative 5/25 Blood >> 5/25 Urine >> 5/26 Trach Asp >>  ANTIBIOTICS: Zosyn 5/22 Cefepime 5/22 - 5/25 Rocephin 5/25 >> Vancomycin 5/22 - 5/24; 5/26>>  SIGNIFICANT EVENTS: 5/22 Admit  LINES/TUBES: OETT 7.5 5/22 >> L IJ CVL 5/22 >> OGT 5/22 >> Foley 5/22 >> PIV x2  ASSESSMENT / PLAN:  PULMONARY  A: Acute  Hypoxic Respiratory Failure H/O COPD  P:   Full vent support Prn BDs SBT & WUA daily  CARDIOVASCULAR A:  Shock - Likely Septic. Resolved. SVT - Fever related. H/O HTN & HLD  P:  Wean pressors to keep MAP > 65 CVP monitoring, goal > 10 Hold outpt lasix, lisinopril, metoprolol  RENAL A:   Acute Renal Failure - Improving. Baseline Creatinine 0.98. Chronic urine retention Psuedohypocalcemia - Corrects to 9.6 (5/23) Hypokalemia - Replacing VT.  P:   Trending UOP with Foley Monitoring electrolytes & renal function daily Continue flomax LR MIVF KCL 22mEq VT x1  GASTROINTESTINAL A:   Nutrition Constipation  P:   Holding Tube feeds Advancing OGT & repeating KUB to confirm placement Protonix for SUP Senna BID VT  HEMATOLOGIC A:   Anemia - Hgb Stable. Suspect dilution. S/P 1u PRBC 5/24.  P:  Trending cell counts daily w/ CBC Heparin [place] q8hr SCDs Transfuse PRBC for HGB < 7  INFECTIOUS A:   Sepsis C5-6 Discitis/Osteomyelitis w/ Trace Epidural Abscess/L4-5 Foraminal Cyst vs abscess E coli UTI H/O osteomyelitis with MRSA bacteremia  P:   Consulting ID Day #5 total of antibiotics Rocephin Day #2 Awaiting finalization of cultures Consulting Neurosurgery  ENDOCRINE A:   DM type II - BG controlled.  P:   Accu-Checks q4hr SSI per resistant algorithm Lantus 10u qhs Hold outpt amaryl, lantus, & victoza  NEUROLOGIC A:   C5-6 Discitis/Osteomyelitis w/ Trace Epidural Abscess & L4/5 Foraminal cyst vs abscess Acute Encephalopathy - Possible delirium w/ sedation. Hx of chronic pain, peripheral neuropathy  P:   Consulting Neurosurgery RASS goal: -1 Fentanyl gtt Versed gtt Starting Precedex gtt Hold outpt oxycodone, paxil, lyrica, zanaflex  FAMILY UPDATE:  Daughter updated at bedside 5/25 by Dr. Ashok Cordia.  TODAY'S SUMMARY:  55 yo male from NH with acute encephalopathy 2nd to septic shock from UTI, AKI, VDRF, and chronic opiate use.  He has hx of MRSA  bacteremia with diffuse areas of osteomyelitis in February 2017. Admitted with E coli UTI. Patient continuing to have intermittent fevers but trend is improving. With findings on MRI I have consulted ID and Neurosurgery to provide further recommendations. I am starting a Precedex infusion to see if this helps with any delirium that may be contributing.  I have spent a total of 37 minutes of critical care time today caring for the patient, contacting consultants, and reviewing the patient's electronic medical record.  Sonia Baller Ashok Cordia, M.D. [facility] Pulmonary & Critical Care Pager:  [phone] After 3pm or if no response, call [phone] 8:45 AM 11/11/2015

## 2015-11-11 NOTE — Progress Notes (Signed)
[facility] Progress Note Patient Name: Gregory [name] DOB: 1960-09-25 MRN: [id number]   Date of Service  11/11/2015  HPI/Events of Note  Family requesting update  eICU Interventions  Updated     Intervention Category Evaluation Type: Other  Travarus Trudo 11/11/2015, 7:00 PM

## 2015-11-11 NOTE — Consult Note (Signed)
[facility]  Total days of antibiotics 6        Day 2 ceftriaxone        Day 3 vanco               Reason for Consult: FUO, worsening osteo   Referring Physician: Jenningts  Active Problems:   Septic shock ([place])   Altered mental status   Pressure ulcer    HPI: Gregory [name] is a 55 y.o. 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 This was first presented to [facility] with shoulder and back pain on 08/01/2015. He was found to have bacteremia from 2/13-2/25. Eventually transferred to [facility] for management of MRSA disseminated disease. He was found to have multiple sources suggesting osteomyelitis though did not undergo debridement. His hospital stay was complicated by respiratory failure requiring intubation 2/2 possible oversedation vs worsening pneumonia. He was discharged to SNF for prolonged abtx therapy which ended on may 1st, and had catheter removed by IR on 5/11. It does not look like he had further abtx. He now presents to [facility] on 5/22 with AMS though to be due to opiates, hypoxia, and high fever which have persisted despite abtx. Infectious work up, purulent looking urine, has only shown ecoli urine cx ( S imi,  Pip/tazo, gent, cefazolin, ctx). Repeat imaging of spine suggests ongoing destruction of cervical spine. ID asked to provide input/recs on abtx management  His hospital course, in more detail by problem, is listed below:  Discontinued vancomycin (from 2/20 at this hospital to 2/26) given persistent MRSA bacteremia, changed to Dapto/Ceftaroline 2/27 with another MRSA positive blood culture from CVC 3/1, indicating treatment failure on dual coverage. TLC removed 3/4 with tip culture sent - NGTD. Had negative culture x 5 days from  2/26 on vanc (only 1 culture drawn), so now back on vancomycin to complete minimum 8 week course. Surveillance blood cultures drawn 08/20/15 - NG x 5 days (drawn after line removal before vanc).   Lower resp cx on 2/24 -> vanco MIC 2   Past Medical History  Diagnosis Date  . Diabetes mellitus without complication ([place])   . Hypertension   . DDD (degenerative disc disease), lumbar   . Osteomyelitis ([place])   . Urinary retention   . COPD (chronic obstructive pulmonary disease) ([place])   . Osteomyelitis ([place])   . MRSA bacteremia   . Chronic back pain   . Anemia   . Acute respiratory failure ([place])   . Urinary retention     Allergies: No Known Allergies   MEDICATIONS: . antiseptic oral rinse  7 mL Mouth Rinse QID  . cefTRIAXone (ROCEPHIN)  IV  2 g Intravenous Daily  . chlorhexidine gluconate (SAGE KIT)  15 mL Mouth Rinse BID  . Chlorhexidine Gluconate Cloth  6 each Topical Q0600  . feeding supplement (VITAL HIGH PROTEIN)  1,000 mL Per Tube Q24H  . heparin subcutaneous  5,000 Units Subcutaneous Q8H  . insulin aspart  0-20 Units Subcutaneous Q4H  . insulin glargine  10 Units Subcutaneous QHS  . mupirocin ointment  1 application Nasal BID  . pantoprazole sodium  40 mg Per Tube Q24H  . sennosides  5 mL Per Tube BID  . tamsulosin  0.4 mg Oral Daily  . vancomycin  750 mg Intravenous Q12H    Social History  Substance Use Topics  . Smoking status: Former Smoker -- 3.00 packs/day    Types: Cigarettes  . Smokeless tobacco: Never Used  . Alcohol Use: No    Family History  Problem Relation Age of Onset  . Cancer Other   . Diabetes Other     Review of Systems -  Unable to obtain since intubated sedated  OBJECTIVE: Temp:  [98.4 F (36.9 C)-103 F (39.4 C)] 99.6 F (37.6 C) (05/26 0800) Pulse Rate:  [83-126] 102 (05/26 1108) Resp:  [14-28] 19 (05/26 1108) BP: (92-145)/(60-106) 145/95 mmHg (05/26 1108) SpO2:  [95 %-100 %] 96 % (05/26 1108) FiO2 (%):  [40 %] 40 % (05/26  1108) Weight:  [222 lb 11.2 oz (101.016 kg)] 222 lb 11.2 oz (101.016 kg) (05/26 0500) Physical Exam  Constitutional: He is oriented to person, opens eyes to name. He appears well-developed and well-nourished. No distress.  HENT: OETT in palce Mouth/Throat:  Cardiovascular: Normal rate, regular rhythm and normal heart sounds. Exam reveals no gallop and no friction rub.  No murmur heard.  Pulmonary/Chest: Effort normal and breath sounds normal. No respiratory distress. He has no wheezes.  Abdominal: Soft. Bowel sounds are normal. He exhibits no distension. There is no tenderness.  Lymphadenopathy:  He has no cervical adenopathy.  Neurological: He is alert and oriented to person, place, and time.  Skin: Skin is warm and dry. No pain to left shoulder, left foot. But right foot has 2 ulcers with hard eschar  Psychiatric: He has a normal mood and affect. His behavior is normal.     LABS: Results for orders placed or performed during the hospital encounter of 11/07/15 (from the past 48 hour(s))  Glucose, capillary     Status: Abnormal   Collection Time: 11/09/15 11:48 AM  Result Value Ref Range   Glucose-Capillary 158 (H) 65 - 99 mg/dL  Glucose, capillary     Status: Abnormal   Collection Time: 11/09/15  4:09 PM  Result Value Ref Range   Glucose-Capillary 116 (H) 65 - 99 mg/dL  Hemoglobin and hematocrit, blood     Status: Abnormal   Collection Time: 11/09/15  6:20 PM  Result Value Ref Range   Hemoglobin 6.5 (LL) 13.0 - 17.0 g/dL    Comment: REPEATED TO VERIFY CRITICAL VALUE NOTED.  VALUE IS CONSISTENT WITH PREVIOUSLY REPORTED AND CALLED VALUE.    HCT 21.9 (L) 39.0 - 52.0 %  Glucose, capillary     Status: Abnormal   Collection Time: 11/09/15  8:24 PM  Result Value Ref Range   Glucose-Capillary 146 (H) 65 - 99 mg/dL  Glucose, capillary     Status: Abnormal   Collection Time: 11/10/15 12:09 AM  Result Value Ref Range   Glucose-Capillary 125 (H) 65 - 99 mg/dL  Glucose, capillary      Status: Abnormal   Collection Time: 11/10/15  4:15 AM  Result Value Ref Range   Glucose-Capillary 110 (H) 65 - 99 mg/dL  CBC     Status: Abnormal   Collection Time: 11/10/15  5:28 AM  Result Value Ref Range   WBC 7.4 4.0 - 10.5 K/uL   RBC 2.98 (L) 4.22 - 5.81 MIL/uL   Hemoglobin 7.7 (L) 13.0 - 17.0 g/dL   HCT 26.5 (L) 39.0 - 52.0 %   MCV 88.9 78.0 - 100.0 fL   MCH 25.8 (L) 26.0 - 34.0 pg   MCHC 29.1 (L) 30.0 - 36.0 g/dL  RDW 16.9 (H) 11.5 - 15.5 %   Platelets 204 150 - 400 K/uL  Comprehensive metabolic panel     Status: Abnormal   Collection Time: 11/10/15  5:28 AM  Result Value Ref Range   Sodium 143 135 - 145 mmol/L   Potassium 3.9 3.5 - 5.1 mmol/L   Chloride 111 101 - 111 mmol/L   CO2 25 22 - 32 mmol/L   Glucose, Bld 164 (H) 65 - 99 mg/dL   BUN 25 (H) 6 - 20 mg/dL   Creatinine, Ser 1.23 0.61 - 1.24 mg/dL   Calcium 9.3 8.9 - 10.3 mg/dL   Total Protein 6.4 (L) 6.5 - 8.1 g/dL   Albumin 2.1 (L) 3.5 - 5.0 g/dL   AST 27 15 - 41 U/L   ALT 18 17 - 63 U/L   Alkaline Phosphatase 111 38 - 126 U/L   Total Bilirubin 0.5 0.3 - 1.2 mg/dL   GFR calc non Af Amer >60 >60 mL/min   GFR calc Af Amer >60 >60 mL/min    Comment: (NOTE) The eGFR has been calculated using the CKD EPI equation. This calculation has not been validated in all clinical situations. eGFR's persistently <60 mL/min signify possible Chronic Kidney Disease.    Anion gap 7 5 - 15  Glucose, capillary     Status: Abnormal   Collection Time: 11/10/15  7:40 AM  Result Value Ref Range   Glucose-Capillary 129 (H) 65 - 99 mg/dL  Glucose, capillary     Status: Abnormal   Collection Time: 11/10/15 12:12 PM  Result Value Ref Range   Glucose-Capillary >600 (HH) 65 - 99 mg/dL  Glucose, capillary     Status: Abnormal   Collection Time: 11/10/15 12:14 PM  Result Value Ref Range   Glucose-Capillary 149 (H) 65 - 99 mg/dL  Glucose, capillary     Status: Abnormal   Collection Time: 11/10/15  4:13 PM  Result Value Ref Range    Glucose-Capillary 185 (H) 65 - 99 mg/dL  Urinalysis, Routine w reflex microscopic (not at [facility])     Status: Abnormal   Collection Time: 11/10/15  5:20 PM  Result Value Ref Range   Color, Urine YELLOW YELLOW   APPearance HAZY (A) CLEAR   Specific Gravity, Urine 1.014 1.005 - 1.030   pH 6.0 5.0 - 8.0   Glucose, UA NEGATIVE NEGATIVE mg/dL   Hgb urine dipstick SMALL (A) NEGATIVE   Bilirubin Urine NEGATIVE NEGATIVE   Ketones, ur 15 (A) NEGATIVE mg/dL   Protein, ur 100 (A) NEGATIVE mg/dL   Nitrite NEGATIVE NEGATIVE   Leukocytes, UA SMALL (A) NEGATIVE  Urine microscopic-add on     Status: Abnormal   Collection Time: 11/10/15  5:20 PM  Result Value Ref Range   Squamous Epithelial / LPF NONE SEEN NONE SEEN   WBC, UA 6-30 0 - 5 WBC/hpf   RBC / HPF 0-5 0 - 5 RBC/hpf   Bacteria, UA RARE (A) NONE SEEN   Casts HYALINE CASTS (A) NEGATIVE   Urine-Other MUCOUS PRESENT   Glucose, capillary     Status: Abnormal   Collection Time: 11/10/15  8:33 PM  Result Value Ref Range   Glucose-Capillary 144 (H) 65 - 99 mg/dL  Glucose, capillary     Status: None   Collection Time: 11/10/15 11:18 PM  Result Value Ref Range   Glucose-Capillary 98 65 - 99 mg/dL  CBC     Status: Abnormal   Collection Time: 11/11/15  6:20 AM  Result  Value Ref Range   WBC 5.5 4.0 - 10.5 K/uL   RBC 2.75 (L) 4.22 - 5.81 MIL/uL   Hemoglobin 7.1 (L) 13.0 - 17.0 g/dL   HCT 24.0 (L) 39.0 - 52.0 %   MCV 87.3 78.0 - 100.0 fL   MCH 25.8 (L) 26.0 - 34.0 pg   MCHC 29.6 (L) 30.0 - 36.0 g/dL   RDW 16.8 (H) 11.5 - 15.5 %   Platelets 204 150 - 400 K/uL  Basic metabolic panel     Status: Abnormal   Collection Time: 11/11/15  6:20 AM  Result Value Ref Range   Sodium 145 135 - 145 mmol/L   Potassium 3.2 (L) 3.5 - 5.1 mmol/L   Chloride 110 101 - 111 mmol/L   CO2 28 22 - 32 mmol/L   Glucose, Bld 120 (H) 65 - 99 mg/dL   BUN 19 6 - 20 mg/dL   Creatinine, Ser 1.16 0.61 - 1.24 mg/dL   Calcium 9.4 8.9 - 10.3 mg/dL   GFR calc non Af Amer >60  >60 mL/min   GFR calc Af Amer >60 >60 mL/min    Comment: (NOTE) The eGFR has been calculated using the CKD EPI equation. This calculation has not been validated in all clinical situations. eGFR's persistently <60 mL/min signify possible Chronic Kidney Disease.    Anion gap 7 5 - 15  Glucose, capillary     Status: Abnormal   Collection Time: 11/11/15  8:26 AM  Result Value Ref Range   Glucose-Capillary 128 (H) 65 - 99 mg/dL   Comment 1 Notify RN     MICRO:  IMAGING: Ct Abdomen Pelvis Wo Contrast  11/10/2015  CLINICAL DATA:  55 year old male with history of urinary tract infection. Ongoing fever. Evaluate for potential abscess. EXAM: CT ABDOMEN AND PELVIS WITHOUT CONTRAST TECHNIQUE: Multidetector CT imaging of the abdomen and pelvis was performed following the standard protocol without IV contrast. COMPARISON:  CT of the abdomen and pelvis 08/01/2015. FINDINGS: Lower chest: Bibasilar opacities in the lungs appear to be predominantly atelectatic, however, resolving areas of airspace consolidation could have a similar appearance. Trace right pleural effusion lying dependently. Hepatobiliary: No definite cystic or solid hepatic lesions are identified in the liver on today's noncontrast CT examination. Unenhanced appearance of the gallbladder is unremarkable. Pancreas: No pancreatic mass or peripancreatic inflammatory changes on today's noncontrast CT examination. Spleen: Spleen is enlarged measuring 15.0 x 5.8 x 15.1 cm (estimated splenic volume of 657 mL). Adrenals/Urinary Tract: 2 mm calcification in the upper pole of the right kidney is favored to be vascular, but could alternatively reflect a small nonobstructive calculus. No other potential calculi are noted within the collecting system of the left kidney, along the course of either ureter or within the lumen of the urinary bladder. No hydroureteronephrosis to indicate urinary tract obstruction at this time. Mild bilateral perinephric stranding  (nonspecific). Unenhanced appearance of the kidneys is otherwise unremarkable. Urinary bladder is nearly completely decompressed with a Foley balloon catheter in place. Bilateral adrenal glands are normal in appearance. Stomach/Bowel: Unenhanced appearance of the stomach is normal. There is no pathologic dilatation of small bowel or colon. Normal appendix. Vascular/Lymphatic: Atherosclerotic calcifications throughout the abdominal and pelvic vasculature, without evidence of aneurysm. No lymphadenopathy noted in the abdomen or pelvis on today's noncontrast CT examination. Several borderline enlarged retroperitoneal lymph nodes are noted, measuring up to 9 mm in short axis in the left para-aortic nodal station (nonspecific). Reproductive: Prostate gland and seminal vesicles are unremarkable in appearance.  Other: Soft tissue stranding around the urinary bladder, likely inflammatory. No well-defined fluid collection identified in the peritoneal cavity to suggest abscess at this time. No significant volume of ascites. No pneumoperitoneum. Musculoskeletal: There are no aggressive appearing lytic or blastic lesions noted in the visualized portions of the skeleton. IMPRESSION: 1. No abscess identified in the abdomen or pelvis. 2. Inflammatory changes in the fat adjacent to the urinary bladder, compatible with the reported clinical history of urinary tract infection. 3. Probable vascular calcification versus tiny 2 mm nonobstructive calculus in the upper pole of the right kidney. 4. Normal appendix. 5. Splenomegaly. 6. Areas of atelectasis or resolving airspace consolidation in the lung bases bilaterally with trace right pleural effusion. 7. Atherosclerosis. Electronically Signed   By: Vinnie Langton M.D.   On: 11/10/2015 08:24   Mr Cervical Spine W Wo Contrast  11/11/2015  CLINICAL DATA:  Urinary tract infection, persistent fever. History of MRSA osteomyelitis and bacteremia, diabetes, hypertension. EXAM: MRI CERVICAL  SPINE WITHOUT AND WITH CONTRAST TECHNIQUE: Multiplanar and multiecho pulse sequences of the cervical spine, to include the craniocervical junction and cervicothoracic junction, were obtained according to standard protocol without and with intravenous contrast. Patient was unable to tolerate further imaging, and post gadolinium axial sequences not obtained. CONTRAST:  20 cc MultiHance COMPARISON:  None. FINDINGS: ALIGNMENT: Focal kyphosis C5-6.  No malalignment. VERTEBRAE/DISCS: Destructive C5-6 endplate changes and mild height loss associated with obliterated disc and effusion, associated abnormal enhancement within the C5-6 endplates and disc. The remaining intervertebral discs demonstrate relatively preserved morphology and signal. Mild chronic discogenic endplate changes at multiple levels. No STIR signal abnormality to suggest fracture. CORD:Mild T2 bright cord edema C4-5 through C6-7 without syrinx. No abnormal cord enhancement. POSTERIOR FOSSA, VERTEBRAL ARTERIES, PARASPINAL TISSUES: Minimal dorsal dural enhancement at C5-6 is well as trace is ventral epidural fluid collection at C5-6 compatible with abscess. Enhancing prevertebral phlegmon. Enhancing bright STIR signal within the C5-6 interspinous space extending to the paraspinal soft tissues compatible with myositis. Life-support lines in place. Vertebral artery flow voids present. DISC LEVELS: C2-3: Uncovertebral hypertrophy without canal stenosis. Mild RIGHT neural foraminal narrowing. C3-4: Small RIGHT central to subarticular disc protrusion. Uncovertebral hypertrophy. Mild canal stenosis. Moderate to severe RIGHT and moderate LEFT neural foraminal narrowing. C4-5: Annular bulging, uncovertebral hypertrophy and tiny central disc protrusion. Mild canal stenosis. Moderate RIGHT neural foraminal narrowing. C5-6: Retropulsed disc material and uncal vertebral hypertrophy results in severe canal stenosis: AP dimension of the canal is 6 mm. Severe bilateral  neural foraminal narrowing. C6-7: Small broad-based disc bulge, uncovertebral hypertrophy resulting in moderate canal stenosis. Severe bilateral neural foraminal narrowing. C7-T1: Small LEFT subarticular disc protrusion. No canal stenosis. Moderate to severe LEFT neural foraminal narrowing. 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 Associated paraspinal muscle edema/myositis. Severe canal stenosis at C5-6. C4-5 through C6-7 spinal cord edema/pre syrinx. No abnormal enhancement of the spinal cord. Moderate canal stenosis at C6-7, mild at C3-4 and C4-5. Multilevel neural foraminal narrowing: Severe at C5-6 and C6-7. Electronically Signed   By: Elon Alas M.D.   On: 11/11/2015 05:51   Mr Thoracic Spine W Wo Contrast  11/11/2015  CLINICAL DATA:  Urinary tract infection, persistent fever. History of MRSA osteomyelitis and bacteremia, diabetes, hypertension. EXAM: MRI THORACIC AND LUMBAR SPINE WITHOUT AND WITH CONTRAST TECHNIQUE: Multiplanar and multiecho pulse sequences of the thoracic and lumbar spine were obtained without and with intravenous contrast. CONTRAST:  20 cc MultiHance COMPARISON:  CT abdomen and pelvis  Nov 10, 2015 FINDINGS: MRI THORACIC SPINE FINDINGS (marked artifact, possibly related life-support lines in 3 tesla environment) ALIGNMENT: Maintenance of the thoracic kyphosis. No malalignment. VERTEBRAE/DISCS: Vertebral bodies are intact. Intervertebral disc morphology's and signal are normal. No abnormal osseous or intradiscal enhancement. CORD: Thoracic spinal cord is normal morphology and signal characteristics to the level the conus medullaris which terminates at T12-L1. PREVERTEBRAL AND PARASPINAL SOFT TISSUES: Small pleural effusions and enhancing probable atelectasis or, possible pneumonia. DEGENERATIVE CHANGE: LEFT central T7-8 moderate disc protrusion resulting in mild canal stenosis. Limited assessment of neural foraminal narrowing due to poor  image quality. MRI LUMBAR SPINE FINDINGS (low signal to noise ratio, further degraded by mild patient motion.) OSSEOUS STRUCTURES: Lumbar vertebral bodies intact. Grade 1 L4-5 anterolisthesis associated with bright STIR signal and enhancing bone marrow signal within the RIGHT L4-5 facet. Mild congenital canal narrowing on the basis of foreshortened pedicles. Mild L3-4, moderate L4-5 and mild L5-S1 disc height loss with decreased T2 signal within the lower lumbar disc compatible with mild desiccation. Mild acute on chronic discogenic endplate changes at S3-4. No STIR signal abnormality to suggest fracture. No vertebral body or intradiscal enhancement. SPINAL CORD: Conus medullaris terminates at T12 and appears normal morphology and signal characteristics. Central displacement of the cauda equina due to canal stenosis. No abnormal cord, leptomeningeal enhancement. SOFT TISSUES: Bright STIR signal versus artifact within the RIGHT psoas muscle. Paraspinal bright interstitial STIR signal. LEVEL BY LEVEL EVALUATION: L1-2: Annular bulging without canal stenosis or neural foraminal narrowing. L2-3: Annular bulging. Mild facet arthropathy and ligamentum flavum redundancy without canal stenosis. Mild LEFT neural foraminal narrowing. L3-4: Large 5 mm broad-based disc bulge. Moderate facet arthropathy and ligamentum flavum redundancy. Severe canal stenosis. Moderate to severe bilateral neural foraminal narrowing. L4-5: Anterolisthesis. Moderate broad-based disc bulge. Severe facet arthropathy and ligamentum flavum redundancy with enhancing RIGHT facet. Severe canal stenosis. Severe RIGHT greater than LEFT neural foraminal narrowing. Abnormal signal within the RIGHT L4-5 lateral recess extend toward the RIGHT L5-S1 neural foramen, contiguous with the RIGHT facet with superimposed enhancement. 6 mm RIGHT facet synovial cyst, less likely abscess. Trace component extensive RIGHT ligamentum flavum. Bright STIR signal within the L4-5  interspinous space. L5-S1: Small broad-based disc bulge eccentric laterally. Mild facet arthropathy and ligamentum flavum redundancy without canal stenosis. Moderate RIGHT, mild LEFT neural foraminal narrowing. IMPRESSION: MRI THORACIC SPINE: No MR findings of infection within the thoracic spine. Moderate LEFT central T7-8 disc protrusion resulting in mild canal stenosis. MRI LUMBAR SPINE: Grade 1 L4-5 anterolisthesis with severe L4-5 RIGHT facet arthropathy, inflammation versus infectious bone marrow changes and facet cyst versus abscess. Possible RIGHT psoas muscle myositis. Severe canal stenosis L3-4 and L4-5. Severe L4-5 and moderate to severe L3-4 neural foraminal narrowing. Electronically Signed   By: Elon Alas M.D.   On: 11/11/2015 06:13   Mr Lumbar Spine W Wo Contrast  11/11/2015  CLINICAL DATA:  Urinary tract infection, persistent fever. History of MRSA osteomyelitis and bacteremia, diabetes, hypertension. EXAM: MRI THORACIC AND LUMBAR SPINE WITHOUT AND WITH CONTRAST TECHNIQUE: Multiplanar and multiecho pulse sequences of the thoracic and lumbar spine were obtained without and with intravenous contrast. CONTRAST:  20 cc MultiHance COMPARISON:  CT abdomen and pelvis Nov 10, 2015 FINDINGS: MRI THORACIC SPINE FINDINGS (marked artifact, possibly related life-support lines in 3 tesla environment) ALIGNMENT: Maintenance of the thoracic kyphosis. No malalignment. VERTEBRAE/DISCS: Vertebral bodies are intact. Intervertebral disc morphology's and signal are normal. No abnormal osseous or intradiscal enhancement. CORD: Thoracic spinal cord is normal  morphology and signal characteristics to the level the conus medullaris which terminates at T12-L1. PREVERTEBRAL AND PARASPINAL SOFT TISSUES: Small pleural effusions and enhancing probable atelectasis or, possible pneumonia. DEGENERATIVE CHANGE: LEFT central T7-8 moderate disc protrusion resulting in mild canal stenosis. Limited assessment of neural foraminal  narrowing due to poor image quality. MRI LUMBAR SPINE FINDINGS (low signal to noise ratio, further degraded by mild patient motion.) OSSEOUS STRUCTURES: Lumbar vertebral bodies intact. Grade 1 L4-5 anterolisthesis associated with bright STIR signal and enhancing bone marrow signal within the RIGHT L4-5 facet. Mild congenital canal narrowing on the basis of foreshortened pedicles. Mild L3-4, moderate L4-5 and mild L5-S1 disc height loss with decreased T2 signal within the lower lumbar disc compatible with mild desiccation. Mild acute on chronic discogenic endplate changes at O1-6. No STIR signal abnormality to suggest fracture. No vertebral body or intradiscal enhancement. SPINAL CORD: Conus medullaris terminates at T12 and appears normal morphology and signal characteristics. Central displacement of the cauda equina due to canal stenosis. No abnormal cord, leptomeningeal enhancement. SOFT TISSUES: Bright STIR signal versus artifact within the RIGHT psoas muscle. Paraspinal bright interstitial STIR signal. LEVEL BY LEVEL EVALUATION: L1-2: Annular bulging without canal stenosis or neural foraminal narrowing. L2-3: Annular bulging. Mild facet arthropathy and ligamentum flavum redundancy without canal stenosis. Mild LEFT neural foraminal narrowing. L3-4: Large 5 mm broad-based disc bulge. Moderate facet arthropathy and ligamentum flavum redundancy. Severe canal stenosis. Moderate to severe bilateral neural foraminal narrowing. L4-5: Anterolisthesis. Moderate broad-based disc bulge. Severe facet arthropathy and ligamentum flavum redundancy with enhancing RIGHT facet. Severe canal stenosis. Severe RIGHT greater than LEFT neural foraminal narrowing. Abnormal signal within the RIGHT L4-5 lateral recess extend toward the RIGHT L5-S1 neural foramen, contiguous with the RIGHT facet with superimposed enhancement. 6 mm RIGHT facet synovial cyst, less likely abscess. Trace component extensive RIGHT ligamentum flavum. Bright STIR  signal within the L4-5 interspinous space. L5-S1: Small broad-based disc bulge eccentric laterally. Mild facet arthropathy and ligamentum flavum redundancy without canal stenosis. Moderate RIGHT, mild LEFT neural foraminal narrowing. IMPRESSION: MRI THORACIC SPINE: No MR findings of infection within the thoracic spine. Moderate LEFT central T7-8 disc protrusion resulting in mild canal stenosis. MRI LUMBAR SPINE: Grade 1 L4-5 anterolisthesis with severe L4-5 RIGHT facet arthropathy, inflammation versus infectious bone marrow changes and facet cyst versus abscess. Possible RIGHT psoas muscle myositis. Severe canal stenosis L3-4 and L4-5. Severe L4-5 and moderate to severe L3-4 neural foraminal narrowing. Electronically Signed   By: Elon Alas M.D.   On: 11/11/2015 06:13   Dg Chest Port 1 View  11/10/2015  CLINICAL DATA:  Respiratory failure. EXAM: PORTABLE CHEST 1 VIEW COMPARISON:  11/09/2015 . FINDINGS: Endotracheal tube, left IJ line, NG tube in stable position. Stable cardiomegaly . Low lung volumes with mild bibasilar atelectasis. No pleural effusion or pneumothorax. IMPRESSION: 1. Lines and tubes in stable position. 2. Low lung volumes with mild bibasilar atelectasis again noted. 3. Stable cardiomegaly. Electronically Signed   By: Marcello Moores  Register   On: 11/10/2015 07:06   Dg Abd Portable 1v  11/10/2015  CLINICAL DATA:  Orogastric tube placement EXAM: PORTABLE ABDOMEN - 1 VIEW COMPARISON:  None. FINDINGS: The there is normal small bowel gas pattern. There is NG tube coiled within proximal stomach with tip in gastric fundus. Moderate gas and stool noted within transverse colon and proximal left colon. IMPRESSION: NG tube coiled within proximal stomach with tip in gastric fundus. Moderate stool and gas noted in visualized colon. Electronically Signed   By:  Lahoma Crocker M.D.   On: 11/10/2015 11:28    Assessment/Plan:  55yo M with copd, obesity, chronic pain meds, recent hx of mrsa bacteremia, multifocal  osteo including lumbar/cervical spine. Admitted for AMS, fevers. Possibly sepsis from urinary source though now still having high fevers, WBC normalized vs. Recurrence of MRSA infection   ecoli uti = he finished 7 day course of treatment today. Can d/c ceftriaxone. abd ct ruled out prostatitis  Hx of mrsa spinal osteo = repeat imaging suggests sequelae of osteo. Will check sed rate and crp. nsgy to weigh in. He was treated from mid feb to beginning of may with IV abtx. Possibly having recurrence  Left shoulder and foot do not seem to be involved this time  Persistent fever = will check differential to see if having eos as possible central fevers. Recommend right foot xray to look for osteo, 2 small eschar on toes ? Possible ongoing source of osteo  Dr hatcher to provde further [employer] [facility] [place] [facility] [phone]  re

## 2015-11-12 ENCOUNTER — Inpatient Hospital Stay (HOSPITAL_COMMUNITY): Payer: Medicaid Other

## 2015-11-12 DIAGNOSIS — M4800 Spinal stenosis, site unspecified: Secondary | ICD-10-CM

## 2015-11-12 DIAGNOSIS — N179 Acute kidney failure, unspecified: Secondary | ICD-10-CM | POA: Diagnosis present

## 2015-11-12 DIAGNOSIS — R6520 Severe sepsis without septic shock: Secondary | ICD-10-CM

## 2015-11-12 DIAGNOSIS — N390 Urinary tract infection, site not specified: Secondary | ICD-10-CM | POA: Diagnosis present

## 2015-11-12 DIAGNOSIS — J9601 Acute respiratory failure with hypoxia: Secondary | ICD-10-CM

## 2015-11-12 DIAGNOSIS — M4622 Osteomyelitis of vertebra, cervical region: Secondary | ICD-10-CM

## 2015-11-12 DIAGNOSIS — M869 Osteomyelitis, unspecified: Secondary | ICD-10-CM

## 2015-11-12 DIAGNOSIS — A419 Sepsis, unspecified organism: Secondary | ICD-10-CM | POA: Diagnosis present

## 2015-11-12 DIAGNOSIS — M4626 Osteomyelitis of vertebra, lumbar region: Secondary | ICD-10-CM

## 2015-11-12 DIAGNOSIS — B9562 Methicillin resistant Staphylococcus aureus infection as the cause of diseases classified elsewhere: Secondary | ICD-10-CM

## 2015-11-12 DIAGNOSIS — G9520 Unspecified cord compression: Secondary | ICD-10-CM

## 2015-11-12 LAB — RENAL FUNCTION PANEL
ALBUMIN: 1.9 g/dL — AB (ref 3.5–5.0)
Anion gap: 6 (ref 5–15)
BUN: 17 mg/dL (ref 6–20)
CHLORIDE: 110 mmol/L (ref 101–111)
CO2: 28 mmol/L (ref 22–32)
Calcium: 9.1 mg/dL (ref 8.9–10.3)
Creatinine, Ser: 0.87 mg/dL (ref 0.61–1.24)
GFR calc Af Amer: >60 mL/min (ref >60)
GFR calc non Af Amer: >60 mL/min (ref >60)
GLUCOSE: 176 mg/dL — AB (ref 65–99)
POTASSIUM: 3.4 mmol/L — AB (ref 3.5–5.1)
Phosphorus: 4.2 mg/dL (ref 2.5–4.6)
Sodium: 144 mmol/L (ref 135–145)

## 2015-11-12 LAB — BLOOD GAS, ARTERIAL
ACID-BASE EXCESS: 1.8 mmol/L (ref 0.0–2.0)
Bicarbonate: 25.8 mEq/L — ABNORMAL HIGH (ref 20.0–24.0)
DRAWN BY: 39899
FIO2: 1
MECHVT: 620 mL
O2 SAT: 99.6 %
PEEP/CPAP: 5 cmH2O
PH ART: 7.424 (ref 7.350–7.450)
PO2 ART: 290 mmHg — AB (ref 80.0–100.0)
Patient temperature: 98.6
RATE: 16 resp/min
TCO2: 27 mmol/L (ref 0–100)
pCO2 arterial: 40 mmHg (ref 35.0–45.0)

## 2015-11-12 LAB — CBC WITH DIFFERENTIAL/PLATELET
Basophils Absolute: 0 10*3/uL (ref 0.0–0.1)
Basophils Relative: 0 %
EOS PCT: 6 %
Eosinophils Absolute: 0.3 10*3/uL (ref 0.0–0.7)
HEMATOCRIT: 23.1 % — AB (ref 39.0–52.0)
Hemoglobin: 6.8 g/dL — CL (ref 13.0–17.0)
LYMPHS ABS: 1.4 10*3/uL (ref 0.7–4.0)
LYMPHS PCT: 27 %
MCH: 25.7 pg — AB (ref 26.0–34.0)
MCHC: 29.4 g/dL — ABNORMAL LOW (ref 30.0–36.0)
MCV: 87.2 fL (ref 78.0–100.0)
MONO ABS: 0.4 10*3/uL (ref 0.1–1.0)
MONOS PCT: 7 %
NEUTROS ABS: 3.1 10*3/uL (ref 1.7–7.7)
Neutrophils Relative %: 59 %
PLATELETS: 212 10*3/uL (ref 150–400)
RBC: 2.65 MIL/uL — ABNORMAL LOW (ref 4.22–5.81)
RDW: 16.6 % — AB (ref 11.5–15.5)
WBC: 5.2 10*3/uL (ref 4.0–10.5)

## 2015-11-12 LAB — MAGNESIUM
Magnesium: 1.5 mg/dL — ABNORMAL LOW (ref 1.7–2.4)
Magnesium: 2.2 mg/dL (ref 1.7–2.4)

## 2015-11-12 LAB — PREPARE RBC (CROSSMATCH)

## 2015-11-12 LAB — CULTURE, BLOOD (ROUTINE X 2)
Culture: NO GROWTH
Culture: NO GROWTH

## 2015-11-12 LAB — HEMOGLOBIN AND HEMATOCRIT, BLOOD
HEMATOCRIT: 26.2 % — AB (ref 39.0–52.0)
HEMOGLOBIN: 7.6 g/dL — AB (ref 13.0–17.0)

## 2015-11-12 LAB — GLUCOSE, CAPILLARY
GLUCOSE-CAPILLARY: 189 mg/dL — AB (ref 65–99)
GLUCOSE-CAPILLARY: 133 mg/dL — AB (ref 65–99)
Glucose-Capillary: 148 mg/dL — ABNORMAL HIGH (ref 65–99)
Glucose-Capillary: 159 mg/dL — ABNORMAL HIGH (ref 65–99)
Glucose-Capillary: 100 mg/dL — ABNORMAL HIGH (ref 65–99)

## 2015-11-12 LAB — TYPE AND SCREEN
ABO/RH(D): O POS
Antibody Screen: NEGATIVE

## 2015-11-12 MED ORDER — DEXMEDETOMIDINE BOLUS VIA INFUSION
1.0000 ug/kg | Freq: Once | INTRAVENOUS | Status: AC
  Administered 2015-11-12: 88.3 ug via INTRAVENOUS
  Filled 2015-11-12: qty 89

## 2015-11-12 MED ORDER — POTASSIUM CHLORIDE 20 MEQ/15ML (10%) PO SOLN: 40.0000 meq | Freq: Once | ORAL | Status: DC

## 2015-11-12 MED ORDER — PAROXETINE HCL 20 MG PO TABS
20.0000 mg | ORAL_TABLET | Freq: Every day | ORAL | Status: DC
  Administered 2015-11-12 – 2015-11-15 (×4): 20 mg
  Filled 2015-11-12 (×4): qty 1

## 2015-11-12 MED ORDER — MIDAZOLAM BOLUS VIA INFUSION
1.0000 mg | INTRAVENOUS | Status: DC | PRN
  Administered 2015-11-13 – 2015-11-14 (×2): 2 mg via INTRAVENOUS
  Administered 2015-11-14: 1 mg via INTRAVENOUS
  Administered 2015-11-14 – 2015-11-15 (×5): 2 mg via INTRAVENOUS
  Filled 2015-11-12 (×9): qty 4

## 2015-11-12 MED ORDER — HALOPERIDOL LACTATE 5 MG/ML IJ SOLN
5.0000 mg | Freq: Once | INTRAMUSCULAR | Status: AC
  Administered 2015-11-12: 5 mg via INTRAVENOUS

## 2015-11-12 MED ORDER — SODIUM CHLORIDE 0.9 % IV SOLN
6.0000 g | Freq: Once | INTRAVENOUS | Status: DC
  Administered 2015-11-12: 6 g via INTRAVENOUS
  Filled 2015-11-12: qty 12

## 2015-11-12 MED ORDER — PREGABALIN 50 MG PO CAPS
75.0000 mg | ORAL_CAPSULE | Freq: Three times a day (TID) | ORAL | Status: DC
  Administered 2015-11-12 – 2015-11-15 (×10): 75 mg
  Filled 2015-11-12 (×10): qty 1

## 2015-11-12 MED ORDER — POTASSIUM CHLORIDE 10 MEQ/50ML IV SOLN
10.0000 meq | INTRAVENOUS | Status: AC
  Administered 2015-11-12 (×2): 10 meq via INTRAVENOUS
  Filled 2015-11-12 (×2): qty 50

## 2015-11-12 MED ORDER — MIDAZOLAM HCL 10 MG/2ML IJ SOLN: 4.0000 mg | Freq: Once | INTRAMUSCULAR | Status: DC

## 2015-11-12 MED ORDER — MIDAZOLAM HCL 2 MG/2ML IJ SOLN
4.0000 mg | Freq: Once | INTRAMUSCULAR | Status: AC
  Administered 2015-11-12: 4 mg via INTRAVENOUS

## 2015-11-12 MED ORDER — SODIUM CHLORIDE 0.9 % IV SOLN
Freq: Once | INTRAVENOUS | Status: AC
  Administered 2015-11-12: 05:00:00 via INTRAVENOUS

## 2015-11-12 MED ORDER — FENTANYL BOLUS VIA INFUSION
50.0000 ug | INTRAVENOUS | Status: DC | PRN
  Administered 2015-11-13: 100 ug via INTRAVENOUS
  Administered 2015-11-14: 50 ug via INTRAVENOUS
  Administered 2015-11-14: 100 ug via INTRAVENOUS
  Administered 2015-11-14: 50 ug via INTRAVENOUS
  Administered 2015-11-14 (×2): 100 ug via INTRAVENOUS
  Administered 2015-11-14: 50 ug via INTRAVENOUS
  Administered 2015-11-14 (×2): 100 ug via INTRAVENOUS
  Administered 2015-11-15: 50 ug via INTRAVENOUS
  Filled 2015-11-12: qty 100

## 2015-11-12 MED ORDER — LORAZEPAM 2 MG/ML IJ SOLN
1.0000 mg | INTRAMUSCULAR | Status: DC | PRN
  Administered 2015-11-12: 2 mg via INTRAVENOUS
  Filled 2015-11-12: qty 1

## 2015-11-12 MED ORDER — LORAZEPAM 2 MG/ML IJ SOLN
INTRAMUSCULAR | Status: AC
  Administered 2015-11-12: 12:00:00
  Filled 2015-11-12: qty 1

## 2015-11-12 MED ORDER — QUETIAPINE FUMARATE 50 MG PO TABS
50.0000 mg | ORAL_TABLET | Freq: Every day | ORAL | Status: DC
  Administered 2015-11-12: 50 mg via ORAL
  Filled 2015-11-12: qty 1

## 2015-11-12 MED ORDER — HALOPERIDOL LACTATE 5 MG/ML IJ SOLN
5.0000 mg | Freq: Four times a day (QID) | INTRAMUSCULAR | Status: DC
  Administered 2015-11-12 – 2015-11-16 (×13): 5 mg via INTRAVENOUS
  Filled 2015-11-12 (×21): qty 1

## 2015-11-12 MED ORDER — SODIUM CHLORIDE 0.9 % IV SOLN
1.0000 mg/h | INTRAVENOUS | Status: DC
  Administered 2015-11-12: 7 mg/h via INTRAVENOUS
  Administered 2015-11-13: 5 mg/h via INTRAVENOUS
  Administered 2015-11-13 – 2015-11-14 (×3): 7 mg/h via INTRAVENOUS
  Administered 2015-11-14 (×2): 10 mg/h via INTRAVENOUS
  Administered 2015-11-15: 5 mg/h via INTRAVENOUS
  Administered 2015-11-15: 2 mg/h via INTRAVENOUS
  Administered 2015-11-15: 10 mg/h via INTRAVENOUS
  Filled 2015-11-12 (×11): qty 10

## 2015-11-12 MED ORDER — HALOPERIDOL LACTATE 5 MG/ML IJ SOLN
5.0000 mg | INTRAMUSCULAR | Status: DC | PRN
Start: 2015-11-12 — End: 2015-11-12
  Administered 2015-11-12: 5 mg via INTRAVENOUS
  Filled 2015-11-12 (×2): qty 1

## 2015-11-12 MED ORDER — MAGNESIUM SULFATE 2 GM/50ML IV SOLN: 2.0000 g | Freq: Once | INTRAVENOUS | Status: AC

## 2015-11-12 NOTE — Procedures (Signed)
Extubation Procedure Note  Patient Details:   Name: Gregory [name] DOB: May 14, 1961 MRN: [id number]   Airway Documentation:     Evaluation  O2 sats: stable throughout Complications: No apparent complications Patient did tolerate procedure well. Bilateral Breath Sounds: Rhonchi   Yes   Patient extubated to 2L nasal cannula per MD order.  Positive cuff leak noted.  No evidence of stridor.  Patient able to speak post extubation.  Sats currently 97%.  Vitals are stable.  No complications noted.  Vu, Yzaguirre 11/12/2015, 10:11 AM

## 2015-11-12 NOTE — Progress Notes (Signed)
INFECTIOUS DISEASE PROGRESS NOTE  ID: Gregory [name] is a 55 y.o. male with  Active Problems:   Septic shock ([place])   Altered mental status   Pressure ulcer  Subjective: Post-extubation.  agitated  Abtx:  Anti-infectives    Start     Dose/Rate Route Frequency Ordered Stop   11/11/15 1000  vancomycin (VANCOCIN) IVPB 750 mg/150 ml premix     750 mg 150 mL/hr over 60 Minutes Intravenous Every 12 hours 11/11/15 0855     11/10/15 1400  cefTRIAXone (ROCEPHIN) 2 g in dextrose 5 % 50 mL IVPB  Status:  Discontinued     2 g 100 mL/hr over 30 Minutes Intravenous Daily 11/10/15 0808 11/11/15 1200   11/09/15 2200  ceFEPIme (MAXIPIME) 2 g in dextrose 5 % 50 mL IVPB  Status:  Discontinued     2 g 100 mL/hr over 30 Minutes Intravenous Every 12 hours 11/09/15 1024 11/09/15 1028   11/09/15 1800  ceFEPIme (MAXIPIME) 2 g in dextrose 5 % 50 mL IVPB  Status:  Discontinued     2 g 100 mL/hr over 30 Minutes Intravenous Every 12 hours 11/09/15 1028 11/10/15 0808   11/09/15 0500  vancomycin (VANCOCIN) 1,500 mg in sodium chloride 0.9 % 500 mL IVPB  Status:  Discontinued     1,500 mg 250 mL/hr over 120 Minutes Intravenous Every 48 hours 11/07/15 0648 11/09/15 1022   11/07/15 0800  ceFEPIme (MAXIPIME) 1 g in dextrose 5 % 50 mL IVPB  Status:  Discontinued     1 g 100 mL/hr over 30 Minutes Intravenous Daily 11/07/15 0648 11/09/15 1024   11/07/15 0230  vancomycin (VANCOCIN) 2,000 mg in sodium chloride 0.9 % 500 mL IVPB     2,000 mg 250 mL/hr over 120 Minutes Intravenous  Once 11/07/15 0223 11/07/15 0454   11/07/15 0215  piperacillin-tazobactam (ZOSYN) IVPB 3.375 g     3.375 g 100 mL/hr over 30 Minutes Intravenous  Once 11/07/15 0212 11/07/15 0339   11/07/15 0215  vancomycin (VANCOCIN) IVPB 1000 mg/200 mL premix  Status:  Discontinued     1,000 mg 200 mL/hr over 60 Minutes Intravenous  Once 11/07/15 0212 11/07/15 0223      Medications:  Scheduled: . antiseptic oral rinse  7 mL Mouth Rinse QID  .  chlorhexidine gluconate (SAGE KIT)  15 mL Mouth Rinse BID  . Chlorhexidine Gluconate Cloth  6 each Topical Q0600  . dexmedetomidine  1 mcg/kg (Adjusted) Intravenous Once  . feeding supplement (VITAL HIGH PROTEIN)  1,000 mL Per Tube Q24H  . heparin subcutaneous  5,000 Units Subcutaneous Q8H  . insulin aspart  0-20 Units Subcutaneous Q4H  . insulin glargine  10 Units Subcutaneous QHS  . LORazepam      . mupirocin ointment  1 application Nasal BID  . pantoprazole sodium  40 mg Per Tube Q24H  . PARoxetine  20 mg Per Tube Daily  . pregabalin  75 mg Per Tube Q8H  . sennosides  5 mL Per Tube BID  . tamsulosin  0.4 mg Oral Daily  . vancomycin  750 mg Intravenous Q12H  . [DISCONTINUED] magnesium sulfate LVP 250-500 ml  6 g Intravenous Once    Objective: Vital signs in last 24 hours: Temp:  [98.4 F (36.9 C)-99.4 F (37.4 C)] 98.7 F (37.1 C) (05/27 0615) Pulse Rate:  [47-223] 118 (05/27 1100) Resp:  [16-26] 26 (05/27 1100) BP: (95-177)/(60-115) 172/92 mmHg (05/27 1100) SpO2:  [94 %-100 %] 94 % (05/27 1100) FiO2 (%):  [  40 %] 40 % (05/27 0815) Weight:  [104.327 kg (230 lb)] 104.327 kg (230 lb) (05/27 0250)   General appearance: alert, moderate distress and agitated Resp: rhonchi bilaterally Cardio: regular rate and rhythm GI: normal findings: bowel sounds normal and soft, non-tender Extremities: superficial wound on R great toe. small amt of bleeding.   Lab Results  Recent Labs  11/11/15 0620 11/12/15 0340  WBC 5.5 5.2  HGB 7.1* 6.8*  HCT 24.0* 23.1*  NA 145 144  K 3.2* 3.4*  CL 110 110  CO2 28 28  BUN 19 17  CREATININE 1.16 0.87   Liver Panel  Recent Labs  11/10/15 0528 11/12/15 0340  PROT 6.4*  --   ALBUMIN 2.1* 1.9*  AST 27  --   ALT 18  --   ALKPHOS 111  --   BILITOT 0.5  --    Sedimentation Rate  Recent Labs  11/11/15 1322  ESRSEDRATE >140*   C-Reactive Protein  Recent Labs  11/11/15 1322  CRP 13.7*    Microbiology: Recent Results (from the  past 240 hour(s))  Urine culture     Status: Abnormal   Collection Time: 11/07/15  2:23 AM  Result Value Ref Range Status   Specimen Description URINE, CATHETERIZED  Final   Special Requests NONE  Final   Culture >=100,000 COLONIES/mL ESCHERICHIA COLI (A)  Final   Report Status 11/09/2015 FINAL  Final   Organism ID, Bacteria ESCHERICHIA COLI (A)  Final      Susceptibility   Escherichia coli - MIC*    AMPICILLIN >=32 RESISTANT Resistant     CEFAZOLIN 8 SENSITIVE Sensitive     CEFTRIAXONE <=1 SENSITIVE Sensitive     CIPROFLOXACIN >=4 RESISTANT Resistant     GENTAMICIN >=16 RESISTANT Resistant     IMIPENEM <=0.25 SENSITIVE Sensitive     NITROFURANTOIN <=16 SENSITIVE Sensitive     TRIMETH/SULFA >=320 RESISTANT Resistant     AMPICILLIN/SULBACTAM >=32 RESISTANT Resistant     PIP/TAZO <=4 SENSITIVE Sensitive     * >=100,000 COLONIES/mL ESCHERICHIA COLI  Blood Culture (routine x 2)     Status: None   Collection Time: 11/07/15  2:25 AM  Result Value Ref Range Status   Specimen Description BLOOD RIGHT ANTECUBITAL  Final   Special Requests BOTTLES DRAWN AEROBIC ONLY 6CC  Final   Culture NO GROWTH 5 DAYS  Final   Report Status 11/12/2015 FINAL  Final  Blood Culture (routine x 2)     Status: None   Collection Time: 11/07/15  2:28 AM  Result Value Ref Range Status   Specimen Description BLOOD LEFT ANTECUBITAL  Final   Special Requests BOTTLES DRAWN AEROBIC ONLY 10CC  Final   Culture NO GROWTH 5 DAYS  Final   Report Status 11/12/2015 FINAL  Final  MRSA PCR Screening     Status: Abnormal   Collection Time: 11/07/15  8:25 AM  Result Value Ref Range Status   MRSA by PCR POSITIVE (A) NEGATIVE Final    Comment:        The GeneXpert MRSA Assay (FDA approved for NASAL specimens only), is one component of a comprehensive MRSA colonization surveillance program. It is not intended to diagnose MRSA infection nor to guide or monitor treatment for MRSA infections. RESULT CALLED TO, READ BACK BY  AND VERIFIED WITH: SATTERFIELD RN 9:55 11/07/15 ([email or web address])   Culture, blood (Routine X 2) w Reflex to ID Panel     Status: None (Preliminary result)   Collection Time: 11/08/15  5:16 PM  Result Value Ref Range Status   Specimen Description BLOOD LEFT ANTECUBITAL  Final   Special Requests BOTTLES DRAWN AEROBIC ONLY [place]  Final   Culture NO GROWTH 4 DAYS  Final   Report Status PENDING  Incomplete  Culture, Urine     Status: None   Collection Time: 11/08/15  6:18 PM  Result Value Ref Range Status   Specimen Description URINE, CATHETERIZED  Final   Special Requests NONE  Final   Culture NO GROWTH  Final   Report Status 11/10/2015 FINAL  Final  Culture, blood (Routine X 2) w Reflex to ID Panel     Status: None (Preliminary result)   Collection Time: 11/08/15  7:18 PM  Result Value Ref Range Status   Specimen Description BLOOD RIGHT HAND  Final   Special Requests IN PEDIATRIC BOTTLE 2ML  Final   Culture NO GROWTH 4 DAYS  Final   Report Status PENDING  Incomplete  Culture, blood (routine x 2)     Status: None (Preliminary result)   Collection Time: 11/10/15  3:27 PM  Result Value Ref Range Status   Specimen Description BLOOD RIGHT HAND  Final   Special Requests BOTTLES DRAWN AEROBIC ONLY 4CC  Final   Culture NO GROWTH 2 DAYS  Final   Report Status PENDING  Incomplete  Culture, blood (routine x 2)     Status: None (Preliminary result)   Collection Time: 11/10/15  4:06 PM  Result Value Ref Range Status   Specimen Description BLOOD RIGHT HAND  Final   Special Requests IN PEDIATRIC BOTTLE 4CC  Final   Culture NO GROWTH 2 DAYS  Final   Report Status PENDING  Incomplete  Culture, Urine     Status: None   Collection Time: 11/10/15  5:20 PM  Result Value Ref Range Status   Specimen Description URINE, CATHETERIZED  Final   Special Requests Rocephin T Cefepime Normal  Final   Culture NO GROWTH  Final   Report Status 11/11/2015 FINAL  Final  Culture, respiratory (NON-Expectorated)     Status:  None (Preliminary result)   Collection Time: 11/11/15  8:50 AM  Result Value Ref Range Status   Specimen Description TRACHEAL ASPIRATE  Final   Special Requests Normal  Final   Gram Stain   Final    ABUNDANT WBC PRESENT,BOTH PMN AND MONONUCLEAR RARE SQUAMOUS EPITHELIAL CELLS PRESENT FEW GRAM POSITIVE COCCI IN PAIRS RARE GRAM POSITIVE RODS RARE GRAM NEGATIVE COCCOBACILLI    Culture CULTURE REINCUBATED FOR BETTER GROWTH  Final   Report Status PENDING  Incomplete    Studies/Results: Mr Cervical Spine W Wo Contrast  11/11/2015  CLINICAL DATA:  Urinary tract infection, persistent fever. History of MRSA osteomyelitis and bacteremia, diabetes, hypertension. EXAM: MRI CERVICAL SPINE WITHOUT AND WITH CONTRAST TECHNIQUE: Multiplanar and multiecho pulse sequences of the cervical spine, to include the craniocervical junction and cervicothoracic junction, were obtained according to standard protocol without and with intravenous contrast. Patient was unable to tolerate further imaging, and post gadolinium axial sequences not obtained. CONTRAST:  20 cc MultiHance COMPARISON:  None. FINDINGS: ALIGNMENT: Focal kyphosis C5-6.  No malalignment. VERTEBRAE/DISCS: Destructive C5-6 endplate changes and mild height loss associated with obliterated disc and effusion, associated abnormal enhancement within the C5-6 endplates and disc. The remaining intervertebral discs demonstrate relatively preserved morphology and signal. Mild chronic discogenic endplate changes at multiple levels. No STIR signal abnormality to suggest fracture. CORD:Mild T2 bright cord edema C4-5 through C6-7 without syrinx. No abnormal  cord enhancement. POSTERIOR FOSSA, VERTEBRAL ARTERIES, PARASPINAL TISSUES: Minimal dorsal dural enhancement at C5-6 is well as trace is ventral epidural fluid collection at C5-6 compatible with abscess. Enhancing prevertebral phlegmon. Enhancing bright STIR signal within the C5-6 interspinous space extending to the  paraspinal soft tissues compatible with myositis. Life-support lines in place. Vertebral artery flow voids present. DISC LEVELS: C2-3: Uncovertebral hypertrophy without canal stenosis. Mild RIGHT neural foraminal narrowing. C3-4: Small RIGHT central to subarticular disc protrusion. Uncovertebral hypertrophy. Mild canal stenosis. Moderate to severe RIGHT and moderate LEFT neural foraminal narrowing. C4-5: Annular bulging, uncovertebral hypertrophy and tiny central disc protrusion. Mild canal stenosis. Moderate RIGHT neural foraminal narrowing. C5-6: Retropulsed disc material and uncal vertebral hypertrophy results in severe canal stenosis: AP dimension of the canal is 6 mm. Severe bilateral neural foraminal narrowing. C6-7: Small broad-based disc bulge, uncovertebral hypertrophy resulting in moderate canal stenosis. Severe bilateral neural foraminal narrowing. C7-T1: Small LEFT subarticular disc protrusion. No canal stenosis. Moderate to severe LEFT neural foraminal narrowing. 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 Associated paraspinal muscle edema/myositis. Severe canal stenosis at C5-6. C4-5 through C6-7 spinal cord edema/pre syrinx. No abnormal enhancement of the spinal cord. Moderate canal stenosis at C6-7, mild at C3-4 and C4-5. Multilevel neural foraminal narrowing: Severe at C5-6 and C6-7. Electronically Signed   By: Elon Alas M.D.   On: 11/11/2015 05:51   Mr Thoracic Spine W Wo Contrast  11/11/2015  CLINICAL DATA:  Urinary tract infection, persistent fever. History of MRSA osteomyelitis and bacteremia, diabetes, hypertension. EXAM: MRI THORACIC AND LUMBAR SPINE WITHOUT AND WITH CONTRAST TECHNIQUE: Multiplanar and multiecho pulse sequences of the thoracic and lumbar spine were obtained without and with intravenous contrast. CONTRAST:  20 cc MultiHance COMPARISON:  CT abdomen and pelvis Nov 10, 2015 FINDINGS: MRI THORACIC SPINE FINDINGS (marked artifact,  possibly related life-support lines in 3 tesla environment) ALIGNMENT: Maintenance of the thoracic kyphosis. No malalignment. VERTEBRAE/DISCS: Vertebral bodies are intact. Intervertebral disc morphology's and signal are normal. No abnormal osseous or intradiscal enhancement. CORD: Thoracic spinal cord is normal morphology and signal characteristics to the level the conus medullaris which terminates at T12-L1. PREVERTEBRAL AND PARASPINAL SOFT TISSUES: Small pleural effusions and enhancing probable atelectasis or, possible pneumonia. DEGENERATIVE CHANGE: LEFT central T7-8 moderate disc protrusion resulting in mild canal stenosis. Limited assessment of neural foraminal narrowing due to poor image quality. MRI LUMBAR SPINE FINDINGS (low signal to noise ratio, further degraded by mild patient motion.) OSSEOUS STRUCTURES: Lumbar vertebral bodies intact. Grade 1 L4-5 anterolisthesis associated with bright STIR signal and enhancing bone marrow signal within the RIGHT L4-5 facet. Mild congenital canal narrowing on the basis of foreshortened pedicles. Mild L3-4, moderate L4-5 and mild L5-S1 disc height loss with decreased T2 signal within the lower lumbar disc compatible with mild desiccation. Mild acute on chronic discogenic endplate changes at I6-2. No STIR signal abnormality to suggest fracture. No vertebral body or intradiscal enhancement. SPINAL CORD: Conus medullaris terminates at T12 and appears normal morphology and signal characteristics. Central displacement of the cauda equina due to canal stenosis. No abnormal cord, leptomeningeal enhancement. SOFT TISSUES: Bright STIR signal versus artifact within the RIGHT psoas muscle. Paraspinal bright interstitial STIR signal. LEVEL BY LEVEL EVALUATION: L1-2: Annular bulging without canal stenosis or neural foraminal narrowing. L2-3: Annular bulging. Mild facet arthropathy and ligamentum flavum redundancy without canal stenosis. Mild LEFT neural foraminal narrowing. L3-4:  Large 5 mm broad-based disc bulge. Moderate facet arthropathy and ligamentum flavum redundancy. Severe canal  stenosis. Moderate to severe bilateral neural foraminal narrowing. L4-5: Anterolisthesis. Moderate broad-based disc bulge. Severe facet arthropathy and ligamentum flavum redundancy with enhancing RIGHT facet. Severe canal stenosis. Severe RIGHT greater than LEFT neural foraminal narrowing. Abnormal signal within the RIGHT L4-5 lateral recess extend toward the RIGHT L5-S1 neural foramen, contiguous with the RIGHT facet with superimposed enhancement. 6 mm RIGHT facet synovial cyst, less likely abscess. Trace component extensive RIGHT ligamentum flavum. Bright STIR signal within the L4-5 interspinous space. L5-S1: Small broad-based disc bulge eccentric laterally. Mild facet arthropathy and ligamentum flavum redundancy without canal stenosis. Moderate RIGHT, mild LEFT neural foraminal narrowing. IMPRESSION: MRI THORACIC SPINE: No MR findings of infection within the thoracic spine. Moderate LEFT central T7-8 disc protrusion resulting in mild canal stenosis. MRI LUMBAR SPINE: Grade 1 L4-5 anterolisthesis with severe L4-5 RIGHT facet arthropathy, inflammation versus infectious bone marrow changes and facet cyst versus abscess. Possible RIGHT psoas muscle myositis. Severe canal stenosis L3-4 and L4-5. Severe L4-5 and moderate to severe L3-4 neural foraminal narrowing. Electronically Signed   By: Elon Alas M.D.   On: 11/11/2015 06:13   Mr Lumbar Spine W Wo Contrast  11/11/2015  CLINICAL DATA:  Urinary tract infection, persistent fever. History of MRSA osteomyelitis and bacteremia, diabetes, hypertension. EXAM: MRI THORACIC AND LUMBAR SPINE WITHOUT AND WITH CONTRAST TECHNIQUE: Multiplanar and multiecho pulse sequences of the thoracic and lumbar spine were obtained without and with intravenous contrast. CONTRAST:  20 cc MultiHance COMPARISON:  CT abdomen and pelvis Nov 10, 2015 FINDINGS: MRI THORACIC SPINE  FINDINGS (marked artifact, possibly related life-support lines in 3 tesla environment) ALIGNMENT: Maintenance of the thoracic kyphosis. No malalignment. VERTEBRAE/DISCS: Vertebral bodies are intact. Intervertebral disc morphology's and signal are normal. No abnormal osseous or intradiscal enhancement. CORD: Thoracic spinal cord is normal morphology and signal characteristics to the level the conus medullaris which terminates at T12-L1. PREVERTEBRAL AND PARASPINAL SOFT TISSUES: Small pleural effusions and enhancing probable atelectasis or, possible pneumonia. DEGENERATIVE CHANGE: LEFT central T7-8 moderate disc protrusion resulting in mild canal stenosis. Limited assessment of neural foraminal narrowing due to poor image quality. MRI LUMBAR SPINE FINDINGS (low signal to noise ratio, further degraded by mild patient motion.) OSSEOUS STRUCTURES: Lumbar vertebral bodies intact. Grade 1 L4-5 anterolisthesis associated with bright STIR signal and enhancing bone marrow signal within the RIGHT L4-5 facet. Mild congenital canal narrowing on the basis of foreshortened pedicles. Mild L3-4, moderate L4-5 and mild L5-S1 disc height loss with decreased T2 signal within the lower lumbar disc compatible with mild desiccation. Mild acute on chronic discogenic endplate changes at V7-8. No STIR signal abnormality to suggest fracture. No vertebral body or intradiscal enhancement. SPINAL CORD: Conus medullaris terminates at T12 and appears normal morphology and signal characteristics. Central displacement of the cauda equina due to canal stenosis. No abnormal cord, leptomeningeal enhancement. SOFT TISSUES: Bright STIR signal versus artifact within the RIGHT psoas muscle. Paraspinal bright interstitial STIR signal. LEVEL BY LEVEL EVALUATION: L1-2: Annular bulging without canal stenosis or neural foraminal narrowing. L2-3: Annular bulging. Mild facet arthropathy and ligamentum flavum redundancy without canal stenosis. Mild LEFT neural  foraminal narrowing. L3-4: Large 5 mm broad-based disc bulge. Moderate facet arthropathy and ligamentum flavum redundancy. Severe canal stenosis. Moderate to severe bilateral neural foraminal narrowing. L4-5: Anterolisthesis. Moderate broad-based disc bulge. Severe facet arthropathy and ligamentum flavum redundancy with enhancing RIGHT facet. Severe canal stenosis. Severe RIGHT greater than LEFT neural foraminal narrowing. Abnormal signal within the RIGHT L4-5 lateral recess extend toward the RIGHT L5-S1 neural  foramen, contiguous with the RIGHT facet with superimposed enhancement. 6 mm RIGHT facet synovial cyst, less likely abscess. Trace component extensive RIGHT ligamentum flavum. Bright STIR signal within the L4-5 interspinous space. L5-S1: Small broad-based disc bulge eccentric laterally. Mild facet arthropathy and ligamentum flavum redundancy without canal stenosis. Moderate RIGHT, mild LEFT neural foraminal narrowing. IMPRESSION: MRI THORACIC SPINE: No MR findings of infection within the thoracic spine. Moderate LEFT central T7-8 disc protrusion resulting in mild canal stenosis. MRI LUMBAR SPINE: Grade 1 L4-5 anterolisthesis with severe L4-5 RIGHT facet arthropathy, inflammation versus infectious bone marrow changes and facet cyst versus abscess. Possible RIGHT psoas muscle myositis. Severe canal stenosis L3-4 and L4-5. Severe L4-5 and moderate to severe L3-4 neural foraminal narrowing. Electronically Signed   By: Elon Alas M.D.   On: 11/11/2015 06:13   Dg Foot 2 Views Right  11/11/2015  CLINICAL DATA:  Wound on the tip of the right great toe. EXAM: RIGHT FOOT - 2 VIEW COMPARISON:  None. FINDINGS: There is no evidence of fracture or dislocation. There is no evidence of arthropathy or other focal bone abnormality. Soft tissue ulcer at the tip of the first distal phalanx. IMPRESSION: Soft tissue ulcer at the tip of the first distal phalanx. No evidence of osteomyelitis of the right foot.  Electronically Signed   By: Kathreen Devoid   On: 11/11/2015 19:38     Assessment/Plan: Cervical, lumbar osteo  MRSA bacteremia  Completed Tx 5-1 Spinal cord compression, stenosis due to above E coli UTI, sepsis Anemia  Total days of antibiotics: 7 (vanco)  Would plan to give him prolonged IV vanco.  It is difficult to know if his bny changes are residua from his prev infection or ongoing infection. His ESR and CRP are both up but I expect that would be given he had spesis/UTI.  Would aim for 8 more weeks of vanco Available as needed.           Bobby Rumpf Infectious Diseases (pager) [phone] [email or web address] 11/12/2015, 11:40 AM  LOS: 5 days

## 2015-11-12 NOTE — Progress Notes (Signed)
PULMONARY / CRITICAL CARE MEDICINE   Name: Gregory [name] MRN: [id number] DOB: 09-Jan-1961    ADMISSION DATE:  11/07/2015  REFERRING MD:  Dr. Leonides Schanz  CHIEF COMPLAINT:  Alerted mental status  HISTORY OF PRESENT ILLNESS:    55 yo male brought to ER with altered mental status.  He resides at [facility].  He was hypoxic, diaphoretic.  He was given narcan and then became agitated.  He was given ativan, and became sedated.  He was noted to have fever, low BP.  He was intubated for airway protection.  He had purulent appearing urine.  He was started on antibiotics, IV fluids, and pressors. He was tx for MRSA bacteremia in February 2017 and multifocal osteomyelitis of Lt foot, Lt shoulder, and spine.  SUBJECTIVE: No acute events overnight. Transfused 1u PRBC this morning for anemia.  REVIEW OF SYSTEMS:  Unable to obtain given intubation & sedation.  VITAL SIGNS: BP 143/79 mmHg  Pulse 51  Temp(Src) 98.7 F (37.1 C) (Rectal)  Resp 17  Ht 6' (1.829 m)  Wt 230 lb (104.327 kg)  BMI 31.19 kg/m2  SpO2 100%  HEMODYNAMICS: CVP:  [8 mmHg] 8 mmHg  VENTILATOR SETTINGS: Vent Mode:  [-] PRVC FiO2 (%):  [40 %] 40 % Set Rate:  [16 bmp] 16 bmp Vt Set:  [620 mL] 620 mL PEEP:  [5 cmH20] 5 cmH20 Plateau Pressure:  [13 cmH20-19 cmH20] 16 cmH20  INTAKE / OUTPUT: I/O last 3 completed shifts: In: 6423.6 [I.V.:4141.6; Blood:602; NG/GT:1380; IV Piggyback:300] Out: [id number] [Urine:3425]  PHYSICAL EXAMINATION: General:  Elderly male. Awake. No distress. Daughter at bedside. Neuro:  Following commands. Limited movement right foot. Good movement bilateral UE & Left foot.  HEENT:  Endotracheal tube in place. No scleral icterus.  Cardiovascular:  Tachycardic. No JVD. No edema. Lungs:  Clear to auscultation bilaterally. Symmetric chest wall rise on ventilator. Normal WOB on PS trial. Abdomen:  Soft. Protuberant. Normal bowel sounds. Still no BM. Integument: Warm and dry. No rash on exposed skin.    LABS:  BMET  Recent Labs Lab 11/10/15 0528 11/11/15 0620 11/12/15 0340  NA 143 145 144  K 3.9 3.2* 3.4*  CL 111 110 110  CO2 25 28 28   BUN 25* 19 17  CREATININE 1.23 1.16 0.87  GLUCOSE 164* 120* 176*    Electrolytes  Recent Labs Lab 11/08/15 0603 11/09/15 0302 11/10/15 0528 11/11/15 0620 11/12/15 0340  CALCIUM 8.2* 8.4* 9.3 9.4 9.1  MG 1.8 1.7  --   --  1.5*  PHOS 3.8 3.7  --   --  4.2    CBC  Recent Labs Lab 11/10/15 0528 11/11/15 0620 11/12/15 0340  WBC 7.4 5.5 5.2  HGB 7.7* 7.1* 6.8*  HCT 26.5* 24.0* 23.1*  PLT 204 204 212    Coag's No results for input(s): APTT, INR in the last 168 hours.  Sepsis Markers  Recent Labs Lab 11/07/15 0225 11/07/15 0236 11/07/15 0519 11/08/15 0603 11/09/15 0302  LATICACIDVEN  --  1.93 0.42*  --   --   PROCALCITON 3.96  --   --  16.32 12.57    ABG  Recent Labs Lab 11/07/15 0529 11/08/15 0329  PHART 7.318* 7.317*  PCO2ART 46.6* 41.6  PO2ART 432.0* 119.0*    Liver Enzymes  Recent Labs Lab 11/07/15 0148 11/08/15 0603 11/10/15 0528 11/12/15 0340  AST 19 15 27   --   ALT 14* 13* 18  --   ALKPHOS 89 74 111  --  BILITOT 0.9 0.6 0.5  --   ALBUMIN 3.2* 2.2* 2.1* 1.9*    Cardiac Enzymes  Recent Labs Lab 11/07/15 0729  TROPONINI 0.03    Glucose  Recent Labs Lab 11/10/15 2318 11/11/15 0826 11/11/15 1126 11/11/15 1517 11/11/15 2007 11/11/15 2345  GLUCAP 98 128* 161* 140* 144* 146*    Imaging Dg Foot 2 Views Right  11/11/2015  CLINICAL DATA:  Wound on the tip of the right great toe. EXAM: RIGHT FOOT - 2 VIEW COMPARISON:  None. FINDINGS: There is no evidence of fracture or dislocation. There is no evidence of arthropathy or other focal bone abnormality. Soft tissue ulcer at the tip of the first distal phalanx. IMPRESSION: Soft tissue ulcer at the tip of the first distal phalanx. No evidence of osteomyelitis of the right foot. Electronically Signed   By: Kathreen Devoid   On: 11/11/2015  19:38     STUDIES:  5/22 CT head: mild small vessel ischemic changes 5/24 Port CXR:  ETT & L IJ CVL in good position. No new focal opacity. 5/24 Port Renal U/S:  No mass or hydronephrosis. 5/25 Port CXR:  ETT in good position. OGT with tip in esopaghus. Low lung volumes. 5/25 CT Abd/Pelvis:  No abscess. Normal appendix. Splenomegaly noted. Inflammatory changes adjacent bladder. 5/26 MRI Spine:  C5-6 discitis osteomyelitis w/ trace epidural abscess. L4-5 facet cyst vs abscess 5/26 R Foot X-ray: Soft tissue ulcer at tip of the first distal phalanx. No evidence of osteomyelitis and right foot. 5/27 Port CXR: Endotracheal tube in good position. NG tube coursing below diaphragm. Bilateral lower lung volumes.  MICROBIOLOGY: 5/22 Blood >> 5/22 Urine:  E coli 5/23 Blood >> 5/23 Urine:  Negative 5/25 Blood >> 5/25 Urine:  Negative 5/26 Trach Asp >>  ANTIBIOTICS: Zosyn 5/22 Cefepime 5/22 - 5/25 Rocephin 5/25 - 5/26 Vancomycin 5/22 - 5/24; 5/26>>  SIGNIFICANT EVENTS: 5/22 Admit  LINES/TUBES: OETT 7.5 5/22 - 5/27; 5/27 >> L IJ CVL 5/22 >> OGT 5/22 >> Foley 5/22 >> PIV x2  ASSESSMENT / PLAN:  PULMONARY A: Acute Hypoxic Respiratory Failure H/O COPD  P:   Full vent support Prn BDs SBT & WUA daily Patient failed extubation today and was subsequently reintubated due to significant agitation  CARDIOVASCULAR A:  Shock - Likely Septic. Resolved. SVT - Fever related. H/O HTN & HLD  P:  Wean pressors to keep MAP > 65 CVP monitoring, goal > 10 Hold outpt lasix, lisinopril, metoprolol Monitoring QTc daily on antipsychotics   RENAL A:   Acute Renal Failure - Improving. Baseline Creatinine 0.98. Chronic urine retention Psuedohypocalcemia - Corrects to 9.6 (5/23) Hypokalemia - Replacing VT. Hypomagnesemia - Replacing IV.  P:   Trending UOP with Foley Monitoring electrolytes & renal function daily Continue flomax LR MIVF KCL 61mEq IV x2 runs Magnesium Sulfate 2gm  IV  GASTROINTESTINAL A:   Nutrition Constipation  P:   Holding Tube feeds Advancing OGT & repeating KUB to confirm placement Protonix for SUP Senna BID VT  HEMATOLOGIC A:   Anemia - No signs of active bleeding. Suspect dilution. S/P 1u PRBC 5/24.  P:  Trending cell counts daily w/ CBC Heparin [place] q8hr SCDs Transfuse PRBC for HGB < 7 Repeat Hgb/Hct at 1600 today w/ Type & Screen  INFECTIOUS A:   Sepsis C5-6 Discitis/Osteomyelitis w/ Trace Epidural Abscess/L4-5 Foraminal Cyst vs abscess E coli UTI H/O osteomyelitis with MRSA bacteremia  P:   ID Following Vancomycin Empirically Awaiting finalization of cultures  ENDOCRINE A:  DM type II - BG controlled.  P:   Accu-Checks q4hr SSI per resistant algorithm Lantus 10u qhs Hold outpt amaryl, lantus, & victoza  NEUROLOGIC A:   C5-6 Discitis/Osteomyelitis w/ Trace Epidural Abscess & L4/5 Foraminal cyst vs abscess Acute Encephalopathy - Delirium. Hx of chronic pain, peripheral neuropathy  P:   Neurosurgery Consulted Aspen Collar RASS goal: -1 Fentanyl gtt Versed gtt Precedex gtt Hold outpt oxycodone & zanaflex Restart Paxil & Lyrica at lower doses Starting Seroquel qhs & Haldol IV q6hr  FAMILY UPDATE:  Daughter updated at bedside 5/27 by Dr. Ashok Cordia.  TODAY'S SUMMARY:  55 yo male from NH with acute encephalopathy 2nd to septic shock from UTI, AKI, VDRF, and chronic opiate use.  He has hx of MRSA bacteremia with diffuse areas of osteomyelitis in February 2017. Admitted with E coli UTI. Patient continuing on vancomycin for osteomyelitis/discitis. Patient reintubated today with profound delirium which was refractory to aggressive efforts at treatment and worsening hypoxia. Initiating antipsychotic treatment with Seroquel & Haldol. Monitoring QTc closely daily.  I have spent a total of 34 minutes of critical care time today caring for the patient independent of time during procedures, updating daughter at bedside,  and reviewing the patient's electronic medical record.  Sonia Baller Ashok Cordia, M.D. [facility] Pulmonary & Critical Care Pager:  [phone] After 3pm or if no response, call [phone] 8:10 AM 11/12/2015

## 2015-11-12 NOTE — Progress Notes (Signed)
[facility] ADULT ICU REPLACEMENT PROTOCOL FOR AM LAB REPLACEMENT ONLY  The patient does apply for the [facility] Adult ICU Electrolyte Replacment Protocol based on the criteria listed below:   1. Is GFR >/= 40 ml/min? Yes.    Patient's GFR today is >60 2. Is urine output >/= 0.5 ml/kg/hr for the last 6 hours? Yes.   Patient's UOP is 0.9 ml/kg/hr 3. Is BUN < 60 mg/dL? Yes.    Patient's BUN today is 17 4. Abnormal electrolyte(s): K3.4,Mg1.5 5. Ordered repletion with: per protocol 6. If a panic level lab has been reported, has the CCM MD in charge been notified? Yes.  .   Physician:  Levada Schilling, MD  [place] Clock 11/12/2015 6:44 AM

## 2015-11-12 NOTE — Progress Notes (Signed)
[place] Progress Note Patient Name: Zuko Bisset DOB: 1961-04-27 MRN: [id number]   Date of Service  11/12/2015  HPI/Events of Note  Anemia - Hgb = 6.8.  eICU Interventions  Will order: 1. Transfuse 1 unit PRBC now.      Intervention Category Intermediate Interventions: Other:  Elky Funches Cornelia Copa 11/12/2015, 4:25 AM

## 2015-11-12 NOTE — Progress Notes (Signed)
eLink Physician-Brief Progress Note Patient Name: Odilon Vandekamp DOB: 04/10/1961 MRN: [id number]   Date of Service  11/12/2015  HPI/Events of Note  Labs back    Lab Results  Component Value Date   HGB 7.6* 11/12/2015   HGB 6.8* 11/12/2015   HGB 7.1* 11/11/2015     eICU Interventions  No tx needed     Intervention Category Major Interventions: Hemorrhage - evaluation and management  Christinia Gully 11/12/2015, 9:03 PM

## 2015-11-12 NOTE — Progress Notes (Signed)
CRITICAL VALUE ALERT  Critical value received: Hemoglobin 6.8  Date of notification:  11/12/2015   Time of notification: 4:21 AM   Critical value read back: yes  Nurse who received alert: Jabier Gauss   MD notified (1st page):  Dr. Corinna Lines   Time of first page:  4:23 AM   MD notified (2nd page):  Time of second page:  Responding MD: Dr. Corinna Lines  Time MD responded: 4:24 AM

## 2015-11-12 NOTE — Procedures (Signed)
Medications: 1. Versed gtt 2. Fentanyl gtt 3. Etomidate 30mg  IV push 4. Rocuronium 60mg  IV push  Description of Procedure: With persistent altered mentation and worsening acute hypoxic respiratory failure patient was in need of emergent intubation. Daughter was asked to step outside of the room. Rapid sequence intubation was performed. Video laryngoscope was utilized to visualize patient's vocal cords with cricoid pressure. 7.5 endotracheal tube was inserted between the vocal cords with ease on first attempt. Endotracheal tube was secured at 26 cm at the teeth. Bilateral breath sounds were auscultated. Repeat capnographic color change was appreciated. Patient had no complications during the procedure. Stat portable chest x-ray pending to confirm endotracheal tube placement.

## 2015-11-13 DIAGNOSIS — M8610 Other acute osteomyelitis, unspecified site: Secondary | ICD-10-CM

## 2015-11-13 LAB — CBC WITH DIFFERENTIAL/PLATELET
BASOS ABS: 0 10*3/uL (ref 0.0–0.1)
BASOS PCT: 0 %
EOS ABS: 0.3 10*3/uL (ref 0.0–0.7)
EOS PCT: 5 %
HCT: 24.5 % — ABNORMAL LOW (ref 39.0–52.0)
Hemoglobin: 7.1 g/dL — ABNORMAL LOW (ref 13.0–17.0)
Lymphocytes Relative: 31 %
Lymphs Abs: 1.8 10*3/uL (ref 0.7–4.0)
MCH: 25.4 pg — ABNORMAL LOW (ref 26.0–34.0)
MCHC: 29 g/dL — ABNORMAL LOW (ref 30.0–36.0)
MCV: 87.8 fL (ref 78.0–100.0)
MONO ABS: 0.4 10*3/uL (ref 0.1–1.0)
Monocytes Relative: 6 %
Neutro Abs: 3.5 10*3/uL (ref 1.7–7.7)
Neutrophils Relative %: 58 %
PLATELETS: 258 10*3/uL (ref 150–400)
RBC: 2.79 MIL/uL — AB (ref 4.22–5.81)
RDW: 16.2 % — AB (ref 11.5–15.5)
WBC: 6 10*3/uL (ref 4.0–10.5)

## 2015-11-13 LAB — TYPE AND SCREEN
ABO/RH(D): O POS
Antibody Screen: NEGATIVE
UNIT DIVISION: 0
Unit division: 0

## 2015-11-13 LAB — RENAL FUNCTION PANEL
ALBUMIN: 1.9 g/dL — AB (ref 3.5–5.0)
Anion gap: 5 (ref 5–15)
BUN: 14 mg/dL (ref 6–20)
CALCIUM: 8.8 mg/dL — AB (ref 8.9–10.3)
CO2: 29 mmol/L (ref 22–32)
CREATININE: 0.83 mg/dL (ref 0.61–1.24)
Chloride: 110 mmol/L (ref 101–111)
GFR calc Af Amer: >60 mL/min (ref >60)
GLUCOSE: 160 mg/dL — AB (ref 65–99)
PHOSPHORUS: 3.9 mg/dL (ref 2.5–4.6)
POTASSIUM: 3.2 mmol/L — AB (ref 3.5–5.1)
SODIUM: 144 mmol/L (ref 135–145)

## 2015-11-13 LAB — CULTURE, BLOOD (ROUTINE X 2)
CULTURE: NO GROWTH
Culture: NO GROWTH

## 2015-11-13 LAB — MAGNESIUM: MAGNESIUM: 2 mg/dL (ref 1.7–2.4)

## 2015-11-13 LAB — CULTURE, RESPIRATORY W GRAM STAIN: Special Requests: NORMAL

## 2015-11-13 LAB — GLUCOSE, CAPILLARY
GLUCOSE-CAPILLARY: 127 mg/dL — AB (ref 65–99)
GLUCOSE-CAPILLARY: 144 mg/dL — AB (ref 65–99)
Glucose-Capillary: 145 mg/dL — ABNORMAL HIGH (ref 65–99)
Glucose-Capillary: 152 mg/dL — ABNORMAL HIGH (ref 65–99)
Glucose-Capillary: 115 mg/dL — ABNORMAL HIGH (ref 65–99)
Glucose-Capillary: 103 mg/dL — ABNORMAL HIGH (ref 65–99)

## 2015-11-13 LAB — CULTURE, RESPIRATORY

## 2015-11-13 MED ORDER — CHLORHEXIDINE GLUCONATE 0.12% ORAL RINSE (MEDLINE KIT)
15.0000 mL | Freq: Two times a day (BID) | OROMUCOSAL | Status: DC
  Administered 2015-11-13 – 2015-11-17 (×8): 15 mL via OROMUCOSAL

## 2015-11-13 MED ORDER — COLLAGENASE 250 UNIT/GM EX OINT
TOPICAL_OINTMENT | Freq: Every day | CUTANEOUS | Status: DC
  Administered 2015-11-13 – 2015-12-02 (×19): via TOPICAL
  Filled 2015-11-13 (×3): qty 30

## 2015-11-13 MED ORDER — BISACODYL 10 MG RE SUPP
10.0000 mg | Freq: Once | RECTAL | Status: AC
  Administered 2015-11-13: 10 mg via RECTAL
  Filled 2015-11-13: qty 1

## 2015-11-13 MED ORDER — QUETIAPINE FUMARATE 50 MG PO TABS
50.0000 mg | ORAL_TABLET | Freq: Two times a day (BID) | ORAL | Status: DC
  Administered 2015-11-13 – 2015-11-17 (×9): 50 mg via ORAL
  Filled 2015-11-13 (×10): qty 1

## 2015-11-13 MED ORDER — ANTISEPTIC ORAL RINSE SOLUTION (CORINZ)
7.0000 mL | OROMUCOSAL | Status: DC
  Administered 2015-11-13 – 2015-11-17 (×36): 7 mL via OROMUCOSAL

## 2015-11-13 MED ORDER — POTASSIUM CHLORIDE 20 MEQ/15ML (10%) PO SOLN
30.0000 meq | ORAL | Status: AC
  Administered 2015-11-13 (×2): 30 meq
  Filled 2015-11-13 (×2): qty 30

## 2015-11-13 NOTE — Consult Note (Signed)
WOC wound consult note Reason for Consult: Right great toe full thickness ulcer with soft black eschar Wound type: Arterial insufficiency Pressure Ulcer POA: No Measurement: 2cm x 1.5cm with depth obscured by the presence of soft black eschar Wound bed: obscured by the presence of soft black eschar Drainage (amount, consistency, odor) light yellow exudate, no odor, small amount Periwound:intact, poor hygiene noted. Dressing procedure/placement/frequency: I will provide an enzymatic debriding agent (collagenase/Santyl) to hasten the removal of the black eschar.  Following that, conservative care orders such as NS dressings changed twice daily or once daily white petrolatum dressings would suffice as the wound repairs and regenerates. Bilateral heel prophylactic dressings are in place and pressure redistribution heel boots are being used to correct foot alignment (lateral rotation) as well as prove pressure injury prevention for the heels. [place] nursing team will not follow, but will remain available to this patient, the nursing and medical teams.  Please re-consult if needed. Thanks, Maudie Flakes, MSN, RN, [place], Arther Abbott  Pager# [phone]

## 2015-11-13 NOTE — Progress Notes (Signed)
PULMONARY / CRITICAL CARE MEDICINE   Name: Gregory [name] MRN: [id number] DOB: September 27, 1960    ADMISSION DATE:  11/07/2015  REFERRING MD:  Dr. Leonides Schanz  CHIEF COMPLAINT:  Alerted mental status  HISTORY OF PRESENT ILLNESS:    55 yo male brought to ER with altered mental status.  He resides at [facility].  He was hypoxic, diaphoretic.  He was given narcan and then became agitated.  He was given ativan, and became sedated.  He was noted to have fever, low BP.  He was intubated for airway protection.  He had purulent appearing urine.  He was started on antibiotics, IV fluids, and pressors. He was tx for MRSA bacteremia in February 2017 and multifocal osteomyelitis of Lt foot, Lt shoulder, and spine.  SUBJECTIVE: No acute events overnight. Reintubated yesterday due to delirium & worsening respiratory status.  REVIEW OF SYSTEMS:  Unable to obtain given intubation & sedation.  VITAL SIGNS: BP 114/76 mmHg  Pulse 81  Temp(Src) 98.6 F (37 C) (Rectal)  Resp 18  Ht 6' (1.829 m)  Wt 224 lb 14.4 oz (102.014 kg)  BMI 30.50 kg/m2  SpO2 99%  HEMODYNAMICS: CVP:  [8 mmHg] 8 mmHg  VENTILATOR SETTINGS: Vent Mode:  [-] PRVC FiO2 (%):  [40 %-100 %] 40 % Set Rate:  [16 bmp] 16 bmp Vt Set:  [620 mL] 620 mL PEEP:  [5 cmH20] 5 cmH20 Pressure Support:  [5 cmH20] 5 cmH20 Plateau Pressure:  [14 cmH20-20 cmH20] 14 cmH20  INTAKE / OUTPUT: I/O last 3 completed shifts: In: 5631.7 [I.V.:3689.7; Blood:602; NG/GT:990; IV Piggyback:350] Out: 2625 [Urine:2625]  PHYSICAL EXAMINATION: General:  Elderly male. No distress. No family at bedside. Neuro:  Pupils symmetric & pinpoint. Sedated. HEENT:  Endotracheal tube in place. No scleral icterus.  Cardiovascular:  Regular rhythm & rate. No JVD. No edema. Lungs:  Coarse breath sounds bilaterally Symmetric chest wall rise on ventilator.  Abdomen:  Soft. Protuberant. Normal bowel sounds. Still no BM. Integument: Warm and dry. No rash on exposed skin. Bandage over  right great toe.  LABS:  BMET  Recent Labs Lab 11/11/15 0620 11/12/15 0340 11/13/15 0431  NA 145 144 144  K 3.2* 3.4* 3.2*  CL 110 110 110  CO2 28 28 29   BUN 19 17 14   CREATININE 1.16 0.87 0.83  GLUCOSE 120* 176* 160*    Electrolytes  Recent Labs Lab 11/09/15 0302  11/11/15 0620 11/12/15 0340 11/12/15 1739 11/13/15 0431  CALCIUM 8.4*  < > 9.4 9.1  --  8.8*  MG 1.7  --   --  1.5* 2.2 2.0  PHOS 3.7  --   --  4.2  --  3.9  < > = values in this interval not displayed.  CBC  Recent Labs Lab 11/11/15 0620 11/12/15 0340 11/12/15 1739 11/13/15 0431  WBC 5.5 5.2  --  6.0  HGB 7.1* 6.8* 7.6* 7.1*  HCT 24.0* 23.1* 26.2* 24.5*  PLT 204 212  --  258    Coag's No results for input(s): APTT, INR in the last 168 hours.  Sepsis Markers  Recent Labs Lab 11/07/15 0225 11/07/15 0236 11/07/15 0519 11/08/15 0603 11/09/15 0302  LATICACIDVEN  --  1.93 0.42*  --   --   PROCALCITON 3.96  --   --  16.32 12.57    ABG  Recent Labs Lab 11/07/15 0529 11/08/15 0329 11/12/15 1520  PHART 7.318* 7.317* 7.424  PCO2ART 46.6* 41.6 40.0  PO2ART 432.0* 119.0* 290*  Liver Enzymes  Recent Labs Lab 11/07/15 0148 11/08/15 0603 11/10/15 0528 11/12/15 0340 11/13/15 0431  AST 19 15 27   --   --   ALT 14* 13* 18  --   --   ALKPHOS 89 74 111  --   --   BILITOT 0.9 0.6 0.5  --   --   ALBUMIN 3.2* 2.2* 2.1* 1.9* 1.9*    Cardiac Enzymes  Recent Labs Lab 11/07/15 0729  TROPONINI 0.03    Glucose  Recent Labs Lab 11/12/15 0856 11/12/15 1240 11/12/15 1638 11/12/15 1933 11/12/15 2329 11/13/15 0333  GLUCAP 148* 159* 133* 100* 145* 152*    Imaging Dg Chest Port 1 View  11/12/2015  CLINICAL DATA:  Endotracheal tube placement. Acute respiratory failure with hypoxemia. EXAM: PORTABLE CHEST 1 VIEW COMPARISON:  Nov 10, 2015. FINDINGS: Endotracheal and nasogastric tubes are unchanged in position. Left internal jugular catheter is unchanged in position. No  pneumothorax is noted. Increased bilateral perihilar and basilar opacities are noted concerning for worsening edema or atelectasis. Bony thorax is unremarkable. IMPRESSION: Increased bilateral perihilar and basilar opacities concerning for worsening edema or atelectasis. Stable support apparatus. Electronically Signed   By: Marijo Conception, M.D.   On: 11/12/2015 13:59     STUDIES:  5/22 CT head: mild small vessel ischemic changes 5/24 Port CXR:  ETT & L IJ CVL in good position. No new focal opacity. 5/24 Port Renal U/S:  No mass or hydronephrosis. 5/25 Port CXR:  ETT in good position. OGT with tip in esopaghus. Low lung volumes. 5/25 CT Abd/Pelvis:  No abscess. Normal appendix. Splenomegaly noted. Inflammatory changes adjacent bladder. 5/26 MRI Spine:  C5-6 discitis osteomyelitis w/ trace epidural abscess. L4-5 facet cyst vs abscess 5/26 R Foot X-ray: Soft tissue ulcer at tip of the first distal phalanx. No evidence of osteomyelitis and right foot. 5/27 Port CXR: Endotracheal tube in good position. NG tube coursing below diaphragm. Bilateral lower lung volumes.  MICROBIOLOGY: 5/22 Blood >> 5/22 Urine:  E coli 5/23 Blood >> 5/23 Urine:  Negative 5/25 Blood >> 5/25 Urine:  Negative 5/26 Trach Asp >>  ANTIBIOTICS: Zosyn 5/22 Cefepime 5/22 - 5/25 Rocephin 5/25 - 5/26 Vancomycin 5/22 - 5/24; 5/26>>  SIGNIFICANT EVENTS: 5/22 Admit  LINES/TUBES: OETT 7.5 5/22 - 5/27; 5/27 >> L IJ CVL 5/22 >> OGT 5/22 >> Foley 5/22 >> PIV x2  ASSESSMENT / PLAN:  PULMONARY A: Acute Hypoxic Respiratory Failure H/O COPD  P:   Full vent support Prn BDs Hold on SBT until tomorrow  CARDIOVASCULAR A:  Shock - Likely Septic. Resolved. SVT - Fever related. H/O HTN & HLD  P:  Wean pressors to keep MAP > 65 CVP monitoring, goal > 10 Hold outpt lasix, lisinopril, metoprolol Monitoring QTc daily on antipsychotics   RENAL A:   Acute Renal Failure - Improving. Baseline Creatinine  0.98. Chronic urine retention Psuedohypocalcemia - Corrects to 9.6 (5/23) Hypokalemia - Replacing VT. Hypomagnesemia - Replacing IV.  P:   Trending UOP with Foley Monitoring electrolytes & renal function daily Continue flomax LR MIVF KCL 60 mEq VT  GASTROINTESTINAL A:   Nutrition Constipation  P:   Holding Tube feeds Advancing OGT & repeating KUB to confirm placement Protonix for SUP Senna BID VT Dulcolax PR  HEMATOLOGIC A:   Anemia - No signs of active bleeding. Suspect dilution. S/P 1u PRBC 5/24.  P:  Trending cell counts daily w/ CBC Heparin [place] q8hr SCDs Transfuse PRBC for HGB < 7  INFECTIOUS  A:   Sepsis C5-6 Discitis/Osteomyelitis w/ Trace Epidural Abscess/L4-5 Foraminal Cyst vs abscess E coli UTI H/O osteomyelitis with MRSA bacteremia  P:   ID Following Vancomycin Empirically Awaiting finalization of cultures  ENDOCRINE A:   DM type II - BG controlled.  P:   Accu-Checks q4hr SSI per resistant algorithm Lantus 10u qhs Hold outpt amaryl, lantus, & victoza  NEUROLOGIC A:   C5-6 Discitis/Osteomyelitis w/ Trace Epidural Abscess & L4/5 Foraminal cyst vs abscess Acute Encephalopathy - Delirium. H/O chronic pain, peripheral neuropathy  P:   Neurosurgery Consulted Aspen Collar RASS goal: -1 Fentanyl gtt Versed gtt Weaning Precedex gtt Hold outpt oxycodone & zanaflex Paxil & Lyrica at lower than home doses Seroquel bid & Haldol IV q6hr  FAMILY UPDATE:  Daughter updated at bedside 5/27 by Dr. Ashok Cordia.  TODAY'S SUMMARY:  55 yo male from NH with acute encephalopathy 2nd to septic shock from UTI, AKI, VDRF, and chronic opiate use.  He has hx of MRSA bacteremia with diffuse areas of osteomyelitis in February 2017. Admitted with E coli UTI. Patient continuing on vancomycin for osteomyelitis/discitis. Continuing to maximize Seroquel for control of delirium. Weaning Precedex to off. Repeat EKG in morning. SBT in morning if Delirium controlled.  I have  spent a total of 32 minutes of critical care time today caring for the patient independent of time during procedures, updating daughter at bedside, and reviewing the patient's electronic medical record.  Sonia Baller Ashok Cordia, M.D. [facility] Pulmonary & Critical Care Pager:  [phone] After 3pm or if no response, call [phone] 7:59 AM 11/13/2015

## 2015-11-13 NOTE — Progress Notes (Signed)
[facility] ADULT ICU REPLACEMENT PROTOCOL FOR AM LAB REPLACEMENT ONLY  The patient does apply for the [facility] Adult ICU Electrolyte Replacment Protocol based on the criteria listed below:   1. Is GFR >/= 40 ml/min? Yes.    Patient's GFR today is >60 2. Is urine output >/= 0.5 ml/kg/hr for the last 6 hours? Yes.   Patient's UOP is 0.5 ml/kg/hr 3. Is BUN < 60 mg/dL? Yes.    Patient's BUN today is 14 4. Abnormal electrolyte(s): Mag 3.2 5. Ordered repletion with: per protocol 6. If a panic level lab has been reported, has the CCM MD in charge been notified? No..   Physician:    Ronda Fairly A 11/13/2015 5:13 AM

## 2015-11-14 DIAGNOSIS — A4101 Sepsis due to Methicillin susceptible Staphylococcus aureus: Secondary | ICD-10-CM

## 2015-11-14 LAB — CBC WITH DIFFERENTIAL/PLATELET
Basophils Absolute: 0 10*3/uL (ref 0.0–0.1)
Basophils Relative: 0 %
EOS ABS: 0.4 10*3/uL (ref 0.0–0.7)
EOS PCT: 6 %
HCT: 25.3 % — ABNORMAL LOW (ref 39.0–52.0)
Hemoglobin: 7.4 g/dL — ABNORMAL LOW (ref 13.0–17.0)
LYMPHS ABS: 1.8 10*3/uL (ref 0.7–4.0)
LYMPHS PCT: 31 %
MCH: 25.8 pg — AB (ref 26.0–34.0)
MCHC: 29.2 g/dL — AB (ref 30.0–36.0)
MCV: 88.2 fL (ref 78.0–100.0)
MONOS PCT: 8 %
Monocytes Absolute: 0.5 10*3/uL (ref 0.1–1.0)
Neutro Abs: 3.2 10*3/uL (ref 1.7–7.7)
Neutrophils Relative %: 55 %
PLATELETS: 286 10*3/uL (ref 150–400)
RBC: 2.87 MIL/uL — AB (ref 4.22–5.81)
RDW: 16.4 % — ABNORMAL HIGH (ref 11.5–15.5)
WBC: 5.9 10*3/uL (ref 4.0–10.5)

## 2015-11-14 LAB — RENAL FUNCTION PANEL
Albumin: 1.9 g/dL — ABNORMAL LOW (ref 3.5–5.0)
Anion gap: 5 (ref 5–15)
BUN: 13 mg/dL (ref 6–20)
CHLORIDE: 109 mmol/L (ref 101–111)
CO2: 29 mmol/L (ref 22–32)
CREATININE: 0.76 mg/dL (ref 0.61–1.24)
Calcium: 8.8 mg/dL — ABNORMAL LOW (ref 8.9–10.3)
GFR calc Af Amer: >60 mL/min (ref >60)
GFR calc non Af Amer: >60 mL/min (ref >60)
GLUCOSE: 134 mg/dL — AB (ref 65–99)
POTASSIUM: 3.7 mmol/L (ref 3.5–5.1)
Phosphorus: 4.5 mg/dL (ref 2.5–4.6)
Sodium: 143 mmol/L (ref 135–145)

## 2015-11-14 LAB — GLUCOSE, CAPILLARY
GLUCOSE-CAPILLARY: 121 mg/dL — AB (ref 65–99)
GLUCOSE-CAPILLARY: 132 mg/dL — AB (ref 65–99)
GLUCOSE-CAPILLARY: 175 mg/dL — AB (ref 65–99)
GLUCOSE-CAPILLARY: 188 mg/dL — AB (ref 65–99)
Glucose-Capillary: 131 mg/dL — ABNORMAL HIGH (ref 65–99)
Glucose-Capillary: 136 mg/dL — ABNORMAL HIGH (ref 65–99)

## 2015-11-14 LAB — MAGNESIUM: Magnesium: 1.6 mg/dL — ABNORMAL LOW (ref 1.7–2.4)

## 2015-11-14 MED ORDER — MAGNESIUM SULFATE 50 % IJ SOLN
3.0000 g | Freq: Once | INTRAVENOUS | Status: AC
  Administered 2015-11-14: 3 g via INTRAVENOUS
  Filled 2015-11-14: qty 6

## 2015-11-14 MED ORDER — PROPOFOL 1000 MG/100ML IV EMUL: 5.0000 ug/kg/min | INTRAVENOUS | Status: DC

## 2015-11-14 MED ORDER — HYDRALAZINE HCL 20 MG/ML IJ SOLN
10.0000 mg | INTRAMUSCULAR | Status: DC | PRN
  Administered 2015-11-14: 10 mg via INTRAVENOUS
  Filled 2015-11-14: qty 1

## 2015-11-14 NOTE — Progress Notes (Signed)
Pharmacy Antibiotic Note  Gregory [name] is a 55 y.o. male admitted from [facility] on 11/07/2015 with urosepsis. Now found to have epidural abscess on MRI - pt has hx of MRSA Osteo. ID planning 8 weeks of treatment with IV Vancomycin.   WBC 5.9, CrCl stable > 100 mL/min   Plan: Continue vancomycin 750 mg q12h (goal tr 15 - 20 mcg/mL) 0930 VT tomorrow   Height: 6' (182.9 cm) Weight: 225 lb 4.8 oz (102.195 kg) IBW/kg (Calculated) : 77.6  Temp (24hrs), Avg:99.7 F (37.6 C), Min:98.6 F (37 C), Max:101 F (38.3 C)   Recent Labs Lab 11/10/15 0528 11/11/15 0620 11/12/15 0340 11/13/15 0431 11/14/15 0351  WBC 7.4 5.5 5.2 6.0 5.9  CREATININE 1.23 1.16 0.87 0.83 0.76    Estimated Creatinine Clearance: 127.5 mL/min (by C-G formula based on Cr of 0.76).    No Known Allergies  Antimicrobials this admission: 5/22 Vanc >> 5/24; 5/26>> 5/22 Cefepime >> 5/25 5/25 CTX>>5/26  Dose adjustments this admission: n/a  Microbiology results: 5/22 BCx x2: negF 5/22 UCx: > 100k ecol MRSA PCR Pos 5/25 Urine >> negF  5/23 Blood x 2 NegF 5/28 blood x 2 ng2d 5/26 tracheal MRSA 5/25 BCx2>> ngtd   Albertina Parr, PharmD., BCPS Clinical Pharmacist Pager [phone]

## 2015-11-14 NOTE — Progress Notes (Signed)
PULMONARY / CRITICAL CARE MEDICINE   Name: Gregory [name] MRN: [id number] DOB: 1960/09/08    ADMISSION DATE:  11/07/2015  REFERRING MD:  Dr. Leonides Schanz  CHIEF COMPLAINT:  Alerted mental status  HISTORY OF PRESENT ILLNESS:    55 yo male brought to ER with altered mental status.  He resides at [facility].  He was hypoxic, diaphoretic.  He was given narcan and then became agitated.  He was given ativan, and became sedated.  He was noted to have fever, low BP.  He was intubated for airway protection.  He had purulent appearing urine.  He was started on antibiotics, IV fluids, and pressors. He was tx for MRSA bacteremia in February 2017 and multifocal osteomyelitis of Lt foot, Lt shoulder, and spine.  SUBJECTIVE: No acute events overnight. Patient in follow commands but intermittently shaking bed rails and agitated.  REVIEW OF SYSTEMS:  Unable to obtain given intubation & sedation.  VITAL SIGNS: BP 157/86 mmHg  Pulse 120  Temp(Src) 99.7 F (37.6 C) (Rectal)  Resp 24  Ht 6' (1.829 m)  Wt 225 lb 4.8 oz (102.195 kg)  BMI 30.55 kg/m2  SpO2 96%  HEMODYNAMICS: CVP:  [5 mmHg-17 mmHg] 5 mmHg  VENTILATOR SETTINGS: Vent Mode:  [-] PSV;CPAP FiO2 (%):  [30 %] 30 % Set Rate:  [16 bmp] 16 bmp Vt Set:  RW:212346 mL] 620 mL PEEP:  [5 cmH20] 5 cmH20 Pressure Support:  [5 cmH20] 5 cmH20 Plateau Pressure:  [11 cmH20-20 cmH20] 15 cmH20  INTAKE / OUTPUT: I/O last 3 completed shifts: In: 6021.9 [I.V.:3871.9; NG/GT:1700; IV Piggyback:450] Out: 2251 [Urine:2250; Stool:1]  PHYSICAL EXAMINATION: General:  Elderly male. No distress. Eyes open. Neuro:  Pupils symmetric & pinpoint. Moving all 4 extremities equally. Raises left hand on command and wiggle his toes on command. HEENT:  Endotracheal tube in place. No scleral icterus.  Cardiovascular:  Regular rhythm & rate. No JVD. No edema. Lungs:  Coarse breath sounds bilaterally with minimal secretions. Symmetric chest wall rise on ventilator.  Abdomen:   Soft. Protuberant. Normal bowel sounds.  Integument: Warm and dry. Mild erythema around nose.  LABS:  BMET  Recent Labs Lab 11/12/15 0340 11/13/15 0431 11/14/15 0351  NA 144 144 143  K 3.4* 3.2* 3.7  CL 110 110 109  CO2 28 29 29   BUN 17 14 13   CREATININE 0.87 0.83 0.76  GLUCOSE 176* 160* 134*    Electrolytes  Recent Labs Lab 11/12/15 0340 11/12/15 1739 11/13/15 0431 11/14/15 0351  CALCIUM 9.1  --  8.8* 8.8*  MG 1.5* 2.2 2.0 1.6*  PHOS 4.2  --  3.9 4.5    CBC  Recent Labs Lab 11/12/15 0340 11/12/15 1739 11/13/15 0431 11/14/15 0351  WBC 5.2  --  6.0 5.9  HGB 6.8* 7.6* 7.1* 7.4*  HCT 23.1* 26.2* 24.5* 25.3*  PLT 212  --  258 286    Coag's No results for input(s): APTT, INR in the last 168 hours.  Sepsis Markers  Recent Labs Lab 11/08/15 0603 11/09/15 0302  PROCALCITON 16.32 12.57    ABG  Recent Labs Lab 11/08/15 0329 11/12/15 1520  PHART 7.317* 7.424  PCO2ART 41.6 40.0  PO2ART 119.0* 290*    Liver Enzymes  Recent Labs Lab 11/08/15 0603 11/10/15 0528 11/12/15 0340 11/13/15 0431 11/14/15 0351  AST 15 27  --   --   --   ALT 13* 18  --   --   --   ALKPHOS 74 111  --   --   --  BILITOT 0.6 0.5  --   --   --   ALBUMIN 2.2* 2.1* 1.9* 1.9* 1.9*    Cardiac Enzymes No results for input(s): TROPONINI, PROBNP in the last 168 hours.  Glucose  Recent Labs Lab 11/13/15 1240 11/13/15 1644 11/13/15 1925 11/13/15 2332 11/14/15 0351 11/14/15 0854  GLUCAP 103* 127* 144* 136* 121* 131*    Imaging No results found.   STUDIES:  5/22 CT head: mild small vessel ischemic changes 5/24 Port CXR:  ETT & L IJ CVL in good position. No new focal opacity. 5/24 Port Renal U/S:  No mass or hydronephrosis. 5/25 Port CXR:  ETT in good position. OGT with tip in esopaghus. Low lung volumes. 5/25 CT Abd/Pelvis:  No abscess. Normal appendix. Splenomegaly noted. Inflammatory changes adjacent bladder. 5/26 MRI Spine:  C5-6 discitis osteomyelitis w/  trace epidural abscess. L4-5 facet cyst vs abscess 5/26 R Foot X-ray: Soft tissue ulcer at tip of the first distal phalanx. No evidence of osteomyelitis and right foot. 5/27 Port CXR: Endotracheal tube in good position. NG tube coursing below diaphragm. Bilateral lower lung volumes.  MICROBIOLOGY: 5/22 Blood:  Negative  5/22 Urine:  E coli 5/23 Blood:  Negative  5/23 Urine:  Negative 5/25 Blood >> 5/25 Urine:  Negative 5/26 Trach Asp:  MRSA  ANTIBIOTICS: Zosyn 5/22 Cefepime 5/22 - 5/25 Rocephin 5/25 - 5/26 Vancomycin 5/22 - 5/24; 5/26>>  SIGNIFICANT EVENTS: 5/22 Admit  LINES/TUBES: OETT 7.5 5/22 - 5/27; 5/27 >> L IJ CVL 5/22 >> OGT 5/22 >> Foley 5/22 >> PIV x2  ASSESSMENT / PLAN:  PULMONARY A: Acute Hypoxic Respiratory Failure MRSA Pneumonia/Tracheobronchitis H/O COPD  P:   Full vent support Prn BDs SVT & WUA daily  CARDIOVASCULAR A:  Shock - Likely Septic. Resolved. SVT - Fever related. H/O HTN & HLD  P:  Wean pressors to keep MAP > 65 CVP monitoring, goal > 10 Hold outpt lasix, lisinopril, metoprolol Monitoring QTc daily on antipsychotics   RENAL A:   Acute Renal Failure - Improving. Baseline Creatinine 0.98. Chronic urine retention Psuedohypocalcemia - Corrects to 9.6 (5/23) Hypokalemia - Resolved. Hypomagnesemia - Replacing IV.  P:   Trending UOP with Foley Monitoring electrolytes & renal function daily Continue flomax LR MIVF Magnesium Sulfate 3gm IV  GASTROINTESTINAL A:   Nutrition Constipation  P:   Continuing Tube feeds Protonix for SUP Senna BID VT  HEMATOLOGIC A:   Anemia - No signs of active bleeding. Suspect dilution. S/P 1u PRBC 5/24.  P:  Trending cell counts daily w/ CBC Heparin [place] q8hr SCDs Transfuse PRBC for HGB < 7  INFECTIOUS A:   Sepsis MRSA Pneumonia/Tracheobronchitis C5-6 Discitis/Osteomyelitis w/ Trace Epidural Abscess/L4-5 Foraminal Cyst vs abscess E coli UTI H/O osteomyelitis with MRSA  bacteremia  P:   ID Following Vancomycin Empirically Awaiting finalization of cultures  ENDOCRINE A:   DM type II - BG controlled.  P:   Accu-Checks q4hr SSI per resistant algorithm Lantus 10u qhs Hold outpt amaryl, lantus, & victoza  NEUROLOGIC A:   C5-6 Discitis/Osteomyelitis w/ Trace Epidural Abscess & L4/5 Foraminal cyst vs abscess Acute Encephalopathy - Delirium. H/O chronic pain, peripheral neuropathy  P:   Neurosurgery Consulted Aspen Collar RASS goal: -1 Fentanyl gtt Versed gtt Weaning Precedex gtt to off Hold outpt oxycodone & zanaflex Paxil & Lyrica at lower than home doses Seroquel bid & Haldol IV q6hr  FAMILY UPDATE:  Daughter last updated at bedside 5/27 by Dr. Ashok Cordia.  TODAY'S SUMMARY:  55 yo male  from NH with acute encephalopathy 2nd to septic shock from UTI, AKI, VDRF, and chronic opiate use.  He has hx of MRSA bacteremia with diffuse areas of osteomyelitis in February 2017. Admitted with E coli UTI. Patient continuing on vancomycin for osteomyelitis/discitis. Now has MRSA pneumonia/tracheobronchitis. Continuing vancomycin. ID following. Delirium seems to slowly be improving on Seroquel & Haldol IV. Plan for spontaneous breathing trial and sedation vacation in the morning.  I have spent a total of 33 minutes of critical care time today caring for the patient and reviewing the patient's electronic medical record.  Sonia Baller Ashok Cordia, M.D. [facility] Pulmonary & Critical Care Pager:  [phone] After 3pm or if no response, call [phone] 12:00 PM 11/14/2015

## 2015-11-15 DIAGNOSIS — R401 Stupor: Secondary | ICD-10-CM

## 2015-11-15 DIAGNOSIS — Z978 Presence of other specified devices: Secondary | ICD-10-CM | POA: Diagnosis present

## 2015-11-15 DIAGNOSIS — Z452 Encounter for adjustment and management of vascular access device: Secondary | ICD-10-CM | POA: Diagnosis present

## 2015-11-15 DIAGNOSIS — Z789 Other specified health status: Secondary | ICD-10-CM

## 2015-11-15 DIAGNOSIS — Z4659 Encounter for fitting and adjustment of other gastrointestinal appliance and device: Secondary | ICD-10-CM

## 2015-11-15 LAB — RENAL FUNCTION PANEL
ALBUMIN: 2 g/dL — AB (ref 3.5–5.0)
Anion gap: 7 (ref 5–15)
BUN: 10 mg/dL (ref 6–20)
CALCIUM: 9.1 mg/dL (ref 8.9–10.3)
CO2: 30 mmol/L (ref 22–32)
Chloride: 104 mmol/L (ref 101–111)
Creatinine, Ser: 0.69 mg/dL (ref 0.61–1.24)
GFR calc Af Amer: >60 mL/min (ref >60)
GLUCOSE: 143 mg/dL — AB (ref 65–99)
PHOSPHORUS: 4.6 mg/dL (ref 2.5–4.6)
Potassium: 3.3 mmol/L — ABNORMAL LOW (ref 3.5–5.1)
Sodium: 141 mmol/L (ref 135–145)

## 2015-11-15 LAB — CBC WITH DIFFERENTIAL/PLATELET
BASOS ABS: 0 10*3/uL (ref 0.0–0.1)
Basophils Relative: 0 %
EOS ABS: 0.4 10*3/uL (ref 0.0–0.7)
EOS PCT: 7 %
HCT: 26.2 % — ABNORMAL LOW (ref 39.0–52.0)
Hemoglobin: 7.7 g/dL — ABNORMAL LOW (ref 13.0–17.0)
Lymphocytes Relative: 27 %
Lymphs Abs: 1.5 10*3/uL (ref 0.7–4.0)
MCH: 25.9 pg — AB (ref 26.0–34.0)
MCHC: 29.4 g/dL — ABNORMAL LOW (ref 30.0–36.0)
MCV: 88.2 fL (ref 78.0–100.0)
Monocytes Absolute: 0.4 10*3/uL (ref 0.1–1.0)
Monocytes Relative: 6 %
Neutro Abs: 3.3 10*3/uL (ref 1.7–7.7)
Neutrophils Relative %: 59 %
PLATELETS: 284 10*3/uL (ref 150–400)
RBC: 2.97 MIL/uL — AB (ref 4.22–5.81)
RDW: 16.4 % — AB (ref 11.5–15.5)
WBC: 5.6 10*3/uL (ref 4.0–10.5)

## 2015-11-15 LAB — CULTURE, BLOOD (ROUTINE X 2)
CULTURE: NO GROWTH
Culture: NO GROWTH

## 2015-11-15 LAB — GLUCOSE, CAPILLARY
GLUCOSE-CAPILLARY: 136 mg/dL — AB (ref 65–99)
GLUCOSE-CAPILLARY: 167 mg/dL — AB (ref 65–99)
GLUCOSE-CAPILLARY: 171 mg/dL — AB (ref 65–99)
Glucose-Capillary: 161 mg/dL — ABNORMAL HIGH (ref 65–99)
Glucose-Capillary: 144 mg/dL — ABNORMAL HIGH (ref 65–99)
Glucose-Capillary: 135 mg/dL — ABNORMAL HIGH (ref 65–99)

## 2015-11-15 LAB — TRIGLYCERIDES: Triglycerides: 206 mg/dL — ABNORMAL HIGH (ref <150)

## 2015-11-15 LAB — PROTIME-INR
INR: 1.1 (ref 0.00–1.49)
PROTHROMBIN TIME: 14.4 s (ref 11.6–15.2)

## 2015-11-15 LAB — VANCOMYCIN, TROUGH: Vancomycin Tr: 15 ug/mL (ref 10.0–20.0)

## 2015-11-15 LAB — MAGNESIUM: MAGNESIUM: 1.8 mg/dL (ref 1.7–2.4)

## 2015-11-15 LAB — APTT: APTT: 35 s (ref 24–37)

## 2015-11-15 MED ORDER — METHADONE HCL 10 MG PO TABS
20.0000 mg | ORAL_TABLET | Freq: Four times a day (QID) | ORAL | Status: DC
  Administered 2015-11-15 – 2015-11-16 (×5): 20 mg via ORAL
  Filled 2015-11-15 (×5): qty 2

## 2015-11-15 MED ORDER — PREGABALIN 50 MG PO CAPS
150.0000 mg | ORAL_CAPSULE | Freq: Three times a day (TID) | ORAL | Status: DC
Start: 2015-11-15 — End: 2015-11-15
  Administered 2015-11-15: 150 mg
  Filled 2015-11-15: qty 1

## 2015-11-15 MED ORDER — OXYCODONE HCL 5 MG/5ML PO SOLN
30.0000 mg | ORAL | Status: DC
  Filled 2015-11-15: qty 30

## 2015-11-15 MED ORDER — PAROXETINE HCL 20 MG PO TABS
40.0000 mg | ORAL_TABLET | Freq: Every day | ORAL | Status: DC
  Administered 2015-11-16 – 2015-11-23 (×8): 40 mg
  Filled 2015-11-15 (×8): qty 2

## 2015-11-15 MED ORDER — FUROSEMIDE 10 MG/ML IJ SOLN
40.0000 mg | Freq: Two times a day (BID) | INTRAMUSCULAR | Status: DC
  Administered 2015-11-15 (×2): 40 mg via INTRAVENOUS
  Filled 2015-11-15 (×3): qty 4

## 2015-11-15 MED ORDER — MAGNESIUM SULFATE 2 GM/50ML IV SOLN
2.0000 g | Freq: Once | INTRAVENOUS | Status: AC
  Administered 2015-11-15: 2 g via INTRAVENOUS
  Filled 2015-11-15: qty 50

## 2015-11-15 MED ORDER — DEXMEDETOMIDINE HCL IN NACL 400 MCG/100ML IV SOLN
0.2000 ug/kg/h | INTRAVENOUS | Status: DC
  Administered 2015-11-15: 0.6 ug/kg/h via INTRAVENOUS
  Administered 2015-11-16: 0.5 ug/kg/h via INTRAVENOUS
  Administered 2015-11-16: 0.4 ug/kg/h via INTRAVENOUS
  Administered 2015-11-16: 1.2 ug/kg/h via INTRAVENOUS
  Filled 2015-11-15 (×3): qty 50
  Filled 2015-11-15: qty 100
  Filled 2015-11-15: qty 50
  Filled 2015-11-15: qty 100

## 2015-11-15 MED ORDER — POTASSIUM CHLORIDE 20 MEQ/15ML (10%) PO SOLN
20.0000 meq | ORAL | Status: AC
  Administered 2015-11-15 (×2): 20 meq
  Filled 2015-11-15 (×2): qty 15

## 2015-11-15 MED ORDER — LORAZEPAM 2 MG/ML IJ SOLN
2.0000 mg | Freq: Three times a day (TID) | INTRAMUSCULAR | Status: DC
  Administered 2015-11-15 – 2015-11-16 (×4): 2 mg via INTRAVENOUS
  Filled 2015-11-15 (×4): qty 1

## 2015-11-15 MED ORDER — OXYCODONE HCL 20 MG/ML PO CONC: 30.0000 mg | ORAL | Status: DC

## 2015-11-15 MED ORDER — PREGABALIN 75 MG PO CAPS
150.0000 mg | ORAL_CAPSULE | Freq: Three times a day (TID) | ORAL | Status: DC
  Administered 2015-11-15 – 2015-11-24 (×26): 150 mg via ORAL
  Filled 2015-11-15: qty 2
  Filled 2015-11-15: qty 1
  Filled 2015-11-15: qty 2
  Filled 2015-11-15 (×2): qty 1
  Filled 2015-11-15 (×9): qty 2
  Filled 2015-11-15: qty 1
  Filled 2015-11-15: qty 2
  Filled 2015-11-15: qty 1
  Filled 2015-11-15 (×3): qty 2
  Filled 2015-11-15 (×3): qty 1
  Filled 2015-11-15 (×5): qty 2

## 2015-11-15 NOTE — Progress Notes (Addendum)
PULMONARY / CRITICAL CARE MEDICINE   Name: Gregory [name] MRN: [id number] DOB: [birth year]    ADMISSION DATE:  11/07/2015  REFERRING MD:  Dr. Leonides Schanz  CHIEF COMPLAINT:  Alerted mental status  HISTORY OF PRESENT ILLNESS:    55 yo male brought to ER with altered mental status.  He resides at [facility].  He was hypoxic, diaphoretic.  He was given narcan and then became agitated.  He was given ativan, and became sedated.  He was noted to have fever, low BP.  He was intubated for airway protection.  He had purulent appearing urine.  He was started on antibiotics, IV fluids, and pressors. He was tx for MRSA bacteremia in February 2017 and multifocal osteomyelitis of Lt foot, Lt shoulder, and spine.  SUBJECTIVE: No acute events overnight. Patient in follow commands but intermittently shaking bed rails and agitated.  REVIEW OF SYSTEMS:  Unable to obtain given intubation & sedation.  VITAL SIGNS: BP 174/99 mmHg  Pulse 110  Temp(Src) 99.8 F (37.7 C) (Rectal)  Resp 29  Ht 6' (1.829 m)  Wt 222 lb 14.4 oz (101.107 kg)  BMI 30.22 kg/m2  SpO2 97%  HEMODYNAMICS: CVP:  [6 mmHg-8 mmHg] 7 mmHg  VENTILATOR SETTINGS: Vent Mode:  [-] PRVC FiO2 (%):  [30 %] 30 % Set Rate:  [16 bmp] 16 bmp Vt Set:  RW:212346 mL] 620 mL PEEP:  [5 cmH20] 5 cmH20 Pressure Support:  [5 cmH20] 5 cmH20 Plateau Pressure:  [10 [id number] cmH20] 18 cmH20  INTAKE / OUTPUT: I/O last 3 completed shifts: In: 4396.8 [I.V.:2496.8; NG/GT:1700; IV Piggyback:200] Out: 3875 [Urine:3875]  PHYSICAL EXAMINATION: General:  Elderly male. No distress. Eyes open. Neuro:  Pupils symmetric & pinpoint. Moving all 4 extremities equally. Follows commands x 4 HEENT:  Endotracheal tube in place. No scleral icterus.  Cardiovascular:  Regular rhythm & rate. No JVD. No edema. Lungs:  Coarse breath sounds bilaterally with minimal secretions. Symmetric chest wall rise on ventilator.  Abdomen:  Soft. Protuberant. Normal bowel sounds.  Integument:  Warm and dry. Mild erythema around nose.  LABS:  BMET  Recent Labs Lab 11/13/15 0431 11/14/15 0351 11/15/15 0430  NA 144 143 141  K 3.2* 3.7 3.3*  CL 110 109 104  CO2 29 29 30   BUN 14 13 10   CREATININE 0.83 0.76 0.69  GLUCOSE 160* 134* 143*    Electrolytes  Recent Labs Lab 11/13/15 0431 11/14/15 0351 11/15/15 0430  CALCIUM 8.8* 8.8* 9.1  MG 2.0 1.6* 1.8  PHOS 3.9 4.5 4.6    CBC  Recent Labs Lab 11/13/15 0431 11/14/15 0351 11/15/15 0430  WBC 6.0 5.9 5.6  HGB 7.1* 7.4* 7.7*  HCT 24.5* 25.3* 26.2*  PLT 258 286 284    Coag's No results for input(s): APTT, INR in the last 168 hours.  Sepsis Markers  Recent Labs Lab 11/09/15 0302  PROCALCITON 12.57    ABG  Recent Labs Lab 11/12/15 1520  PHART 7.424  PCO2ART 40.0  PO2ART 290*    Liver Enzymes  Recent Labs Lab 11/10/15 0528  11/13/15 0431 11/14/15 0351 11/15/15 0430  AST 27  --   --   --   --   ALT 18  --   --   --   --   ALKPHOS 111  --   --   --   --   BILITOT 0.5  --   --   --   --   ALBUMIN 2.1*  < > 1.9* 1.9*  2.0*  < > = values in this interval not displayed.  Cardiac Enzymes No results for input(s): TROPONINI, PROBNP in the last 168 hours.  Glucose  Recent Labs Lab 11/14/15 1211 11/14/15 1621 11/14/15 2042 11/15/15 0044 11/15/15 0514 11/15/15 0846  GLUCAP 132* 175* 188* 167* 136* 161*    Imaging No results found.   STUDIES:  5/22 CT head: mild small vessel ischemic changes 5/24 Port CXR:  ETT & L IJ CVL in good position. No new focal opacity. 5/24 Port Renal U/S:  No mass or hydronephrosis. 5/25 Port CXR:  ETT in good position. OGT with tip in esopaghus. Low lung volumes. 5/25 CT Abd/Pelvis:  No abscess. Normal appendix. Splenomegaly noted. Inflammatory changes adjacent bladder. 5/26 MRI Spine:  C5-6 discitis osteomyelitis w/ trace epidural abscess. L4-5 facet cyst vs abscess 5/26 R Foot X-ray: Soft tissue ulcer at tip of the first distal phalanx. No evidence of  osteomyelitis and right foot. 5/27 Port CXR: Endotracheal tube in good position. NG tube coursing below diaphragm. Bilateral lower lung volumes.  MICROBIOLOGY: 5/22 Blood:  Negative  5/22 Urine:  E coli 5/23 Blood:  Negative  5/23 Urine:  Negative 5/25 Blood >> NGTD 5/25 Urine:  Negative 5/26 Trach Asp:  MRSA  ANTIBIOTICS: Zosyn 5/22>>> Cefepime 5/22 - 5/25 Rocephin 5/25 - 5/26 Vancomycin 5/22 - 5/24; 5/26>>  SIGNIFICANT EVENTS: 5/22 Admit  LINES/TUBES: OETT 7.5 5/22 - 5/27; 5/27 >> L IJ CVL 5/22 >> OGT 5/22 >> Foley 5/22 >> PIV x2  ASSESSMENT / PLAN:  PULMONARY A: Acute Hypoxic Respiratory Failure MRSA Pneumonia/Tracheobronchitis H/O COPD  P:   Placed on full vent support for increasing agitation -PRVC 16, TV 620, peep 5 --> wean to PSV as tolerated Prn BDs SVT & WUA daily Consider trach  CARDIOVASCULAR A:  Shock - Likely Septic. Resolved. SVT - Fever related. H/O HTN & HLD  P:  Pressor need resolved CVP monitoring, goal > 10 Hold outpt lasix, lisinopril, metoprolol Monitoring QTc daily on antipsychotics   RENAL A:   Acute Renal Failure - Improving. Baseline Creatinine 0.98. Chronic urine retention Psuedohypocalcemia - Corrects to 9.6 (5/23) Hypokalemia - Resolved. Hypomagnesemia - Replacing IV.  P:   Trending UOP with Foley Monitoring electrolytes & renal function daily Continue flomax DC LR lasix  GASTROINTESTINAL A:   Nutrition Constipation  P:   Continuing Tube feeds Protonix for SUP Senna BID VT  HEMATOLOGIC A:   Anemia - No signs of active bleeding. Suspect dilution. S/P 1u PRBC 5/24.  P:  Trending cell counts daily w/ CBC Heparin [place] q8hr Get coags SCDs Transfuse PRBC for HGB < 7  INFECTIOUS A:   Sepsis MRSA Pneumonia/Tracheobronchitis C5-6 Discitis/Osteomyelitis w/ Trace Epidural Abscess/L4-5 Foraminal Cyst vs abscess E coli UTI H/O osteomyelitis with MRSA bacteremia  P:   ID Following Sputum culture + MRSA  (final) Continue Vanc per ID (they recommend prolonged course 8 weeks)  ENDOCRINE A:   DM type II - BG controlled.  P:   Accu-Checks q4hr SSI per resistant algorithm Lantus 10u qhs Hold outpt amaryl, lantus, & victoza  NEUROLOGIC A:   C5-6 Discitis/Osteomyelitis w/ Trace Epidural Abscess & L4/5 Foraminal cyst vs abscess Acute Encephalopathy - Delirium. H/O chronic pain, peripheral neuropathy  P:   Neurosurgery Consulted Aspen Collar RASS goal: -1 Fentanyl gtt- wean as tolerated Versed gtt- wean as tolerated Precedex added in AM for severe agitation  Consider methadone Increase Paxil and Lyrica back to home doses Seroquel bid & Haldol IV q6hr  FAMILY UPDATE:  Daughter last updated at bedside 5/27 by Dr. Ashok Cordia.  TODAY'S SUMMARY:  55 yo male from NH with acute encephalopathy 2nd to septic shock from UTI, AKI, VDRF, and chronic opiate use.  He has hx of MRSA bacteremia with diffuse areas of osteomyelitis in February 2017. Admitted with E coli UTI. Patient continuing on vancomycin for osteomyelitis/discitis. Now has MRSA pneumonia/tracheobronchitis. Continuing vancomycin. ID following. Delirium seems to slowly be improving on Seroquel & Haldol IV. Restart home chronic pain meds and increase paxil and lyrica to home doses.   Amalia Hailey FNP, ACNP-S 10:29 AM 11/15/2015  STAFF NOTE: Linwood Dibbles, MD FACP have personally reviewed patient's available data, including medical history, events of note, physical examination and test results as part of my evaluation. I have discussed with resident/NP and other care providers such as pharmacist, RN and RRT. In addition, I personally evaluated patient and elicited key findings of: awake, FC, ronchi, failing daily weaning, wean PS 15 needed, soon at day 14  Ett,. Would trach , add lasix to neg balance, if not weaning well tues, will plan trach wed at bedside, adding lasix to neg goal 2 liter daily, k supp, get coags for possible trach,  kvo, Tf to goal, is he a vanc failure, linazolid role>?, no role zosyn, keep off, I updated daughter at bedside myself, high fent 400, high versed,, add methadone with assessment qtc, add ativan in hopes to reduce versed and dc precedex The patient is critically ill with multiple organ systems failure and requires high complexity decision making for assessment and support, frequent evaluation and titration of therapies, application of advanced monitoring technologies and extensive interpretation of multiple databases.   Critical Care Time devoted to patient care services described in this note is35 Minutes. This time reflects time of care of this signee: Merrie Roof, MD FACP. This critical care time does not reflect procedure time, or teaching time or supervisory time of PA/NP/Med student/Med Resident etc but could involve care discussion time. Rest per NP/medical resident whose note is outlined above and that I agree with   Lavon Paganini. Titus Mould, MD, [place] Pgr: [place] [facility] 11/15/2015 12:31 PM

## 2015-11-15 NOTE — Progress Notes (Signed)
Pharmacy Antibiotic Note  Gregory [name] is a 55 y.o. male admitted on 11/07/2015 with disseminated MRSA infection, including bacteremia, osteomyelitis, discitis, and pneumonia.  Pharmacy is currently consulted for Vancomycin dosing. He has had a prolonged treatment course for his MRSA infections dating back to February 2017. Currently his Vancomycin trough is therapeutic on Vancomycin 750mg  IV q12h.  His renal function has stabilized.  Plan: Vancomycin 750 IV every 12 hours.  Goal trough 15-20 mcg/mL.  Recheck trough in 2-3 days Monitor renal function closely  Height: 6' (182.9 cm) Weight: 222 lb 14.4 oz (101.107 kg) IBW/kg (Calculated) : 77.6  Temp (24hrs), Avg:99.8 F (37.7 C), Min:99.2 F (37.3 C), Max:100.1 F (37.8 C)   Recent Labs Lab 11/11/15 0620 11/12/15 0340 11/13/15 0431 11/14/15 0351 11/15/15 0430 11/15/15 1016  WBC 5.5 5.2 6.0 5.9 5.6  --   CREATININE 1.16 0.87 0.83 0.76 0.69  --   VANCOTROUGH  --   --   --   --   --  15    Estimated Creatinine Clearance: 126.9 mL/min (by C-G formula based on Cr of 0.69).    No Known Allergies  Antimicrobials this admission: 5/22 Vanc >>5/24; 5/26>> (7/21? - 8 weeks per ID) -5/30 VT 15 on 750 q12h 5/22 Cefepime >>5/24 5/24 Ceftriaxone>>5/26   Recent Microbiology results: 5/23 Blood cultures - negative 5/25 Blood cultures - negative 5/26 Trach aspirate - MRSA  Thank you for allowing pharmacy to be a part of this patient's care.  Legrand Como, Pharm.D., BCPS, AAHIVP Clinical Pharmacist Phone: [phone] or [phone] 11/15/2015, 11:18 AM

## 2015-11-15 NOTE — Progress Notes (Signed)
INFECTIOUS DISEASE PROGRESS NOTE   abtx Day # 10 ID: Gregory [name] is a 55 y.o. male with  Active Problems:   Septic shock ([place])   Altered mental status   Pressure ulcer   Acute respiratory failure with hypoxemia ([place])   Sepsis ([place])   UTI (lower urinary tract infection)   Osteomyelitis ([facility])  Subjective: Afebrile, remains intubated, attempted to wean from vent but then became anxious/agitated  Abtx:  Anti-infectives    Start     Dose/Rate Route Frequency Ordered Stop   11/11/15 1000  vancomycin (VANCOCIN) IVPB 750 mg/150 ml premix     750 mg 150 mL/hr over 60 Minutes Intravenous Every 12 hours 11/11/15 0855     11/10/15 1400  cefTRIAXone (ROCEPHIN) 2 g in dextrose 5 % 50 mL IVPB  Status:  Discontinued     2 g 100 mL/hr over 30 Minutes Intravenous Daily 11/10/15 0808 11/11/15 1200   11/09/15 2200  ceFEPIme (MAXIPIME) 2 g in dextrose 5 % 50 mL IVPB  Status:  Discontinued     2 g 100 mL/hr over 30 Minutes Intravenous Every 12 hours 11/09/15 1024 11/09/15 1028   11/09/15 1800  ceFEPIme (MAXIPIME) 2 g in dextrose 5 % 50 mL IVPB  Status:  Discontinued     2 g 100 mL/hr over 30 Minutes Intravenous Every 12 hours 11/09/15 1028 11/10/15 0808   11/09/15 0500  vancomycin (VANCOCIN) 1,500 mg in sodium chloride 0.9 % 500 mL IVPB  Status:  Discontinued     1,500 mg 250 mL/hr over 120 Minutes Intravenous Every 48 hours 11/07/15 0648 11/09/15 1022   11/07/15 0800  ceFEPIme (MAXIPIME) 1 g in dextrose 5 % 50 mL IVPB  Status:  Discontinued     1 g 100 mL/hr over 30 Minutes Intravenous Daily 11/07/15 0648 11/09/15 1024   11/07/15 0230  vancomycin (VANCOCIN) 2,000 mg in sodium chloride 0.9 % 500 mL IVPB     2,000 mg 250 mL/hr over 120 Minutes Intravenous  Once 11/07/15 0223 11/07/15 0454   11/07/15 0215  piperacillin-tazobactam (ZOSYN) IVPB 3.375 g     3.375 g 100 mL/hr over 30 Minutes Intravenous  Once 11/07/15 0212 11/07/15 0339   11/07/15 0215  vancomycin (VANCOCIN) IVPB 1000 mg/200 mL  premix  Status:  Discontinued     1,000 mg 200 mL/hr over 60 Minutes Intravenous  Once 11/07/15 0212 11/07/15 0223      Medications:  Scheduled: . antiseptic oral rinse  7 mL Mouth Rinse 10 times per day  . chlorhexidine gluconate (SAGE KIT)  15 mL Mouth Rinse BID  . collagenase   Topical Daily  . feeding supplement (VITAL HIGH PROTEIN)  1,000 mL Per Tube Q24H  . haloperidol lactate  5 mg Intravenous Q6H  . heparin subcutaneous  5,000 Units Subcutaneous Q8H  . insulin aspart  0-20 Units Subcutaneous Q4H  . insulin glargine  10 Units Subcutaneous QHS  . pantoprazole sodium  40 mg Per Tube Q24H  . PARoxetine  20 mg Per Tube Daily  . pregabalin  75 mg Per Tube Q8H  . QUEtiapine  50 mg Oral BID  . sennosides  5 mL Per Tube BID  . tamsulosin  0.4 mg Oral Daily  . vancomycin  750 mg Intravenous Q12H    Objective: Vital signs in last 24 hours: Temp:  [99.2 F (37.3 C)-100.1 F (37.8 C)] 99.8 F (37.7 C) (05/30 0400) Pulse Rate:  [75-125] 110 (05/30 1000) Resp:  [14-29] 29 (05/30 1000)  BP: (95-174)/(55-111) 174/99 mmHg (05/30 1000) SpO2:  [95 %-100 %] 97 % (05/30 1000) FiO2 (%):  [30 %] 30 % (05/30 0800) Weight:  [222 lb 14.4 oz (101.107 kg)] 222 lb 14.4 oz (101.107 kg) (05/30 0247)   General appearance: alert, moderate distress and agitated Resp: rhonchi bilaterally Cardio: regular rate and rhythm GI: normal findings: bowel sounds normal and soft, non-tender Extremities: superficial wound on R great toe. small amt of bleeding.   Lab Results  Recent Labs  11/14/15 0351 11/15/15 0430  WBC 5.9 5.6  HGB 7.4* 7.7*  HCT 25.3* 26.2*  NA 143 141  K 3.7 3.3*  CL 109 104  CO2 29 30  BUN 13 10  CREATININE 0.76 0.69   Liver Panel  Recent Labs  11/14/15 0351 11/15/15 0430  ALBUMIN 1.9* 2.0*   Lab Results  Component Value Date   ESRSEDRATE >140* 11/11/2015   Lab Results  Component Value Date   CRP 13.7* 11/11/2015    cxr per my read: increased pulmonary  vasculature c/w pulm edema  Microbiology: Recent Results (from the past 240 hour(s))  Urine culture     Status: Abnormal   Collection Time: 11/07/15  2:23 AM  Result Value Ref Range Status   Specimen Description URINE, CATHETERIZED  Final   Special Requests NONE  Final   Culture >=100,000 COLONIES/mL ESCHERICHIA COLI (A)  Final   Report Status 11/09/2015 FINAL  Final   Organism ID, Bacteria ESCHERICHIA COLI (A)  Final      Susceptibility   Escherichia coli - MIC*    AMPICILLIN >=32 RESISTANT Resistant     CEFAZOLIN 8 SENSITIVE Sensitive     CEFTRIAXONE <=1 SENSITIVE Sensitive     CIPROFLOXACIN >=4 RESISTANT Resistant     GENTAMICIN >=16 RESISTANT Resistant     IMIPENEM <=0.25 SENSITIVE Sensitive     NITROFURANTOIN <=16 SENSITIVE Sensitive     TRIMETH/SULFA >=320 RESISTANT Resistant     AMPICILLIN/SULBACTAM >=32 RESISTANT Resistant     PIP/TAZO <=4 SENSITIVE Sensitive     * >=100,000 COLONIES/mL ESCHERICHIA COLI  Blood Culture (routine x 2)     Status: None   Collection Time: 11/07/15  2:25 AM  Result Value Ref Range Status   Specimen Description BLOOD RIGHT ANTECUBITAL  Final   Special Requests BOTTLES DRAWN AEROBIC ONLY 6CC  Final   Culture NO GROWTH 5 DAYS  Final   Report Status 11/12/2015 FINAL  Final  Blood Culture (routine x 2)     Status: None   Collection Time: 11/07/15  2:28 AM  Result Value Ref Range Status   Specimen Description BLOOD LEFT ANTECUBITAL  Final   Special Requests BOTTLES DRAWN AEROBIC ONLY 10CC  Final   Culture NO GROWTH 5 DAYS  Final   Report Status 11/12/2015 FINAL  Final  MRSA PCR Screening     Status: Abnormal   Collection Time: 11/07/15  8:25 AM  Result Value Ref Range Status   MRSA by PCR POSITIVE (A) NEGATIVE Final    Comment:        The GeneXpert MRSA Assay (FDA approved for NASAL specimens only), is one component of a comprehensive MRSA colonization surveillance program. It is not intended to diagnose MRSA infection nor to guide  or monitor treatment for MRSA infections. RESULT CALLED TO, READ BACK BY AND VERIFIED WITH: SATTERFIELD RN 9:55 11/07/15 ([email or web address])   Culture, blood (Routine X 2) w Reflex to ID Panel     Status: None   Collection Time:  11/08/15  5:16 PM  Result Value Ref Range Status   Specimen Description BLOOD LEFT ANTECUBITAL  Final   Special Requests BOTTLES DRAWN AEROBIC ONLY [place]  Final   Culture NO GROWTH 5 DAYS  Final   Report Status 11/13/2015 FINAL  Final  Culture, Urine     Status: None   Collection Time: 11/08/15  6:18 PM  Result Value Ref Range Status   Specimen Description URINE, CATHETERIZED  Final   Special Requests NONE  Final   Culture NO GROWTH  Final   Report Status 11/10/2015 FINAL  Final  Culture, blood (Routine X 2) w Reflex to ID Panel     Status: None   Collection Time: 11/08/15  7:18 PM  Result Value Ref Range Status   Specimen Description BLOOD RIGHT HAND  Final   Special Requests IN PEDIATRIC BOTTLE 2ML  Final   Culture NO GROWTH 5 DAYS  Final   Report Status 11/13/2015 FINAL  Final  Culture, blood (routine x 2)     Status: None (Preliminary result)   Collection Time: 11/10/15  3:27 PM  Result Value Ref Range Status   Specimen Description BLOOD RIGHT HAND  Final   Special Requests BOTTLES DRAWN AEROBIC ONLY 4CC  Final   Culture NO GROWTH 4 DAYS  Final   Report Status PENDING  Incomplete  Culture, blood (routine x 2)     Status: None (Preliminary result)   Collection Time: 11/10/15  4:06 PM  Result Value Ref Range Status   Specimen Description BLOOD RIGHT HAND  Final   Special Requests IN PEDIATRIC BOTTLE 4CC  Final   Culture NO GROWTH 4 DAYS  Final   Report Status PENDING  Incomplete  Culture, Urine     Status: None   Collection Time: 11/10/15  5:20 PM  Result Value Ref Range Status   Specimen Description URINE, CATHETERIZED  Final   Special Requests Rocephin T Cefepime Normal  Final   Culture NO GROWTH  Final   Report Status 11/11/2015 FINAL  Final  Culture,  respiratory (NON-Expectorated)     Status: None   Collection Time: 11/11/15  8:50 AM  Result Value Ref Range Status   Specimen Description TRACHEAL ASPIRATE  Final   Special Requests Normal  Final   Gram Stain   Final    ABUNDANT WBC PRESENT,BOTH PMN AND MONONUCLEAR RARE SQUAMOUS EPITHELIAL CELLS PRESENT FEW GRAM POSITIVE COCCI IN PAIRS RARE GRAM POSITIVE RODS RARE GRAM NEGATIVE COCCOBACILLI    Culture   Final    ABUNDANT METHICILLIN RESISTANT STAPHYLOCOCCUS AUREUS   Report Status 11/13/2015 FINAL  Final   Organism ID, Bacteria METHICILLIN RESISTANT STAPHYLOCOCCUS AUREUS  Final      Susceptibility   Methicillin resistant staphylococcus aureus - MIC*    CIPROFLOXACIN >=8 RESISTANT Resistant     ERYTHROMYCIN >=8 RESISTANT Resistant     GENTAMICIN <=0.5 SENSITIVE Sensitive     OXACILLIN >=4 RESISTANT Resistant     TETRACYCLINE <=1 SENSITIVE Sensitive     VANCOMYCIN 1 SENSITIVE Sensitive     TRIMETH/SULFA <=10 SENSITIVE Sensitive     CLINDAMYCIN <=0.25 SENSITIVE Sensitive     RIFAMPIN <=0.5 SENSITIVE Sensitive     Inducible Clindamycin NEGATIVE Sensitive     * ABUNDANT METHICILLIN RESISTANT STAPHYLOCOCCUS AUREUS  vanco MIC 1  Studies/Results: No results found.   Assessment/Plan: Cervical, lumbar osteo  MRSA bacteremia  Completed Tx 5-1 Spinal cord compression, stenosis due to above E coli UTI, sepsis Anemia MRSA pneumonia  Total  days of antibiotics: 10 (vanco)  - MRSA pneumonia/tracheobronchitis = currently being treated on vanco for ongoing MRSA spinal infection. Would plan to give him prolonged IV vanco.   -MRSA cervical/lumbar osteo = plan to treat for 56days. Currently on day 10 of 56. MRI imaging is difficult to know if his bony changes are residua from his prev infection or ongoing infection. Given elevated inflammatory markers, would treat for relapse infection. Seen by neurosurgery already to provide recs. Review of his old records and micro results did not suggest  that he had VRSA,though he had isolate with mic 2. He has had aki to vancomycin in the past, thus was placed on daptomycin as well as linezolid. He appears to be tolerating vancomycin, and his current isolate is sensitive. Would continue with vancomycin for now          Carlyle Basques Infectious Diseases (pager) [phone] [email or web address] 11/15/2015, 10:43 AM  LOS: 8 days

## 2015-11-15 NOTE — Progress Notes (Signed)
CSW continues to follow for disposition and transfer back to [facility] when medically stable and cleared for discharge.          Emiliano Dyer, LCSW [facility] ED/39M Clinical Social Worker [phone]

## 2015-11-15 NOTE — Progress Notes (Signed)
[facility] ADULT ICU REPLACEMENT PROTOCOL FOR AM LAB REPLACEMENT ONLY  The patient does apply for the [facility] Adult ICU Electrolyte Replacment Protocol based on the criteria listed below:   1. Is GFR >/= 40 ml/min? Yes.    Patient's GFR today is >60 2. Is urine output >/= 0.5 ml/kg/hr for the last 6 hours? Yes.   Patient's UOP is 0.8 ml/kg/hr 3. Is BUN < 60 mg/dL? Yes.    Patient's BUN today is 10 4. Abnormal electrolyte(s): K 3.3, Mag 1.8 5. Ordered repletion with: per protocol 6. If a panic level lab has been reported, has the CCM MD in charge been notified? No..   Physician:    Ronda Fairly A 11/15/2015 6:10 AM

## 2015-11-16 LAB — GLUCOSE, CAPILLARY
GLUCOSE-CAPILLARY: 121 mg/dL — AB (ref 65–99)
GLUCOSE-CAPILLARY: 139 mg/dL — AB (ref 65–99)
GLUCOSE-CAPILLARY: 154 mg/dL — AB (ref 65–99)
GLUCOSE-CAPILLARY: 174 mg/dL — AB (ref 65–99)
Glucose-Capillary: 153 mg/dL — ABNORMAL HIGH (ref 65–99)
Glucose-Capillary: 173 mg/dL — ABNORMAL HIGH (ref 65–99)

## 2015-11-16 LAB — RENAL FUNCTION PANEL
ALBUMIN: 2.2 g/dL — AB (ref 3.5–5.0)
Anion gap: 7 (ref 5–15)
BUN: 11 mg/dL (ref 6–20)
CALCIUM: 9.3 mg/dL (ref 8.9–10.3)
CO2: 34 mmol/L — ABNORMAL HIGH (ref 22–32)
CREATININE: 0.76 mg/dL (ref 0.61–1.24)
Chloride: 99 mmol/L — ABNORMAL LOW (ref 101–111)
GFR calc Af Amer: >60 mL/min (ref >60)
Glucose, Bld: 142 mg/dL — ABNORMAL HIGH (ref 65–99)
PHOSPHORUS: 5.2 mg/dL — AB (ref 2.5–4.6)
Potassium: 3.3 mmol/L — ABNORMAL LOW (ref 3.5–5.1)
SODIUM: 140 mmol/L (ref 135–145)

## 2015-11-16 LAB — CBC WITH DIFFERENTIAL/PLATELET
BASOS ABS: 0 10*3/uL (ref 0.0–0.1)
Basophils Relative: 0 %
EOS ABS: 0.4 10*3/uL (ref 0.0–0.7)
EOS PCT: 5 %
HCT: 28.5 % — ABNORMAL LOW (ref 39.0–52.0)
Hemoglobin: 8.3 g/dL — ABNORMAL LOW (ref 13.0–17.0)
Lymphocytes Relative: 26 %
Lymphs Abs: 1.9 10*3/uL (ref 0.7–4.0)
MCH: 25.6 pg — ABNORMAL LOW (ref 26.0–34.0)
MCHC: 29.1 g/dL — ABNORMAL LOW (ref 30.0–36.0)
MCV: 88 fL (ref 78.0–100.0)
Monocytes Absolute: 0.3 10*3/uL (ref 0.1–1.0)
Monocytes Relative: 4 %
Neutro Abs: 4.7 10*3/uL (ref 1.7–7.7)
Neutrophils Relative %: 65 %
PLATELETS: 359 10*3/uL (ref 150–400)
RBC: 3.24 MIL/uL — AB (ref 4.22–5.81)
RDW: 16.8 % — AB (ref 11.5–15.5)
WBC: 7.3 10*3/uL (ref 4.0–10.5)

## 2015-11-16 LAB — MAGNESIUM: MAGNESIUM: 1.6 mg/dL — AB (ref 1.7–2.4)

## 2015-11-16 LAB — TRIGLYCERIDES: Triglycerides: 223 mg/dL — ABNORMAL HIGH (ref <150)

## 2015-11-16 MED ORDER — LORAZEPAM 2 MG/ML IJ SOLN
2.0000 mg | INTRAMUSCULAR | Status: DC
  Filled 2015-11-16: qty 1

## 2015-11-16 MED ORDER — FENTANYL CITRATE (PF) 100 MCG/2ML IJ SOLN: 50.0000 ug | Freq: Once | INTRAMUSCULAR | Status: DC

## 2015-11-16 MED ORDER — FENTANYL BOLUS VIA INFUSION
50.0000 ug | INTRAVENOUS | Status: DC | PRN
  Filled 2015-11-16: qty 100

## 2015-11-16 MED ORDER — POTASSIUM CHLORIDE 20 MEQ/15ML (10%) PO SOLN
20.0000 meq | ORAL | Status: AC
  Administered 2015-11-16 (×2): 20 meq
  Filled 2015-11-16 (×2): qty 15

## 2015-11-16 MED ORDER — HYDROMORPHONE HCL 1 MG/ML IJ SOLN
1.0000 mg | INTRAMUSCULAR | Status: DC | PRN
  Administered 2015-11-16 – 2015-11-17 (×5): 1 mg via INTRAVENOUS
  Filled 2015-11-16 (×4): qty 1

## 2015-11-16 MED ORDER — HALOPERIDOL LACTATE 5 MG/ML IJ SOLN
10.0000 mg | Freq: Four times a day (QID) | INTRAMUSCULAR | Status: DC | PRN
  Administered 2015-11-16: 10 mg via INTRAVENOUS

## 2015-11-16 MED ORDER — MIDAZOLAM BOLUS VIA INFUSION: 1.0000 mg | INTRAVENOUS | Status: DC | PRN

## 2015-11-16 MED ORDER — SODIUM CHLORIDE 0.9 % IV SOLN
25.0000 ug/h | INTRAVENOUS | Status: DC
  Administered 2015-11-16: 50 ug/h via INTRAVENOUS
  Filled 2015-11-16: qty 50

## 2015-11-16 MED ORDER — CLONIDINE HCL 0.1 MG PO TABS
0.1000 mg | ORAL_TABLET | Freq: Three times a day (TID) | ORAL | Status: DC
Start: 2015-11-16 — End: 2015-11-17
  Administered 2015-11-17: 0.1 mg via ORAL
  Filled 2015-11-16 (×3): qty 1

## 2015-11-16 MED ORDER — HALOPERIDOL LACTATE 5 MG/ML IJ SOLN
INTRAMUSCULAR | Status: AC
  Filled 2015-11-16: qty 2

## 2015-11-16 MED ORDER — FENTANYL BOLUS VIA INFUSION
50.0000 ug | INTRAVENOUS | Status: DC | PRN
  Filled 2015-11-16: qty 50

## 2015-11-16 MED ORDER — LORAZEPAM 2 MG/ML IJ SOLN
2.0000 mg | Freq: Once | INTRAMUSCULAR | Status: AC
  Administered 2015-11-16: 2 mg via INTRAVENOUS

## 2015-11-16 MED ORDER — FENTANYL CITRATE (PF) 100 MCG/2ML IJ SOLN
50.0000 ug | Freq: Once | INTRAMUSCULAR | Status: AC
  Administered 2015-11-16: 50 ug via INTRAVENOUS

## 2015-11-16 MED ORDER — FUROSEMIDE 10 MG/ML IJ SOLN
20.0000 mg | Freq: Two times a day (BID) | INTRAMUSCULAR | Status: DC
  Administered 2015-11-16 – 2015-11-17 (×3): 20 mg via INTRAVENOUS
  Filled 2015-11-16 (×2): qty 2

## 2015-11-16 MED ORDER — HALOPERIDOL LACTATE 5 MG/ML IJ SOLN
10.0000 mg | Freq: Four times a day (QID) | INTRAMUSCULAR | Status: DC
  Administered 2015-11-16 – 2015-11-17 (×3): 10 mg via INTRAVENOUS
  Filled 2015-11-16 (×5): qty 2

## 2015-11-16 MED ORDER — MAGNESIUM SULFATE 2 GM/50ML IV SOLN
2.0000 g | Freq: Once | INTRAVENOUS | Status: AC
  Administered 2015-11-16: 2 g via INTRAVENOUS
  Filled 2015-11-16: qty 50

## 2015-11-16 MED ORDER — HYDROMORPHONE HCL 1 MG/ML IJ SOLN
INTRAMUSCULAR | Status: AC
  Filled 2015-11-16: qty 1

## 2015-11-16 MED ORDER — FENTANYL CITRATE (PF) 100 MCG/2ML IJ SOLN
INTRAMUSCULAR | Status: AC
  Filled 2015-11-16: qty 2

## 2015-11-16 NOTE — Progress Notes (Signed)
Very agitated.  ? W/d.. Not sure if this is benzo OR narc related. Says he feels nervous. Narc w/d seems unlikely given he is on methadone but could have pain.   Plan Try low dose dilaudid  Increase ativan freq May need precedex    Erick Colace ACNP-BC [place] Pager # [phone] OR # [phone] if no answer

## 2015-11-16 NOTE — Progress Notes (Addendum)
PULMONARY / CRITICAL CARE MEDICINE   Name: Gregory [name] MRN: [id number] DOB: [birth year]    ADMISSION DATE:  11/07/2015  REFERRING MD:  Dr. Leonides Schanz  CHIEF COMPLAINT:  Alerted mental status  HISTORY OF PRESENT ILLNESS:    55 yo male brought to ER with altered mental status.  He resides at [facility].  He was hypoxic, diaphoretic.  He was given narcan and then became agitated.  He was given ativan, and became sedated.  He was noted to have fever, low BP.  He was intubated for airway protection.  He had purulent appearing urine.  He was started on antibiotics, IV fluids, and pressors. He was tx for MRSA bacteremia in February 2017 and multifocal osteomyelitis of Lt foot, Lt shoulder, and spine.  SUBJECTIVE: No acute events overnight. Patient in follow commands but intermittently shaking bed rails and agitated.   VITAL SIGNS: BP 102/64 mmHg  Pulse 86  Temp(Src) 100 F (37.8 C) (Rectal)  Resp 10  Ht 6' (1.829 m)  Wt 217 lb 14.4 oz (98.839 kg)  BMI 29.55 kg/m2  SpO2 97%  HEMODYNAMICS: CVP:  [7 mmHg] 7 mmHg  VENTILATOR SETTINGS: Vent Mode:  [-] CPAP;PSV FiO2 (%):  [30 %] 30 % Set Rate:  [16 bmp] 16 bmp Vt Set:  [620 mL] 620 mL PEEP:  [5 cmH20] 5 cmH20 Pressure Support:  [5 cmH20] 5 cmH20 Plateau Pressure:  [13 cmH20-16 cmH20] 16 cmH20  INTAKE / OUTPUT: I/O last 3 completed shifts: In: 4311.6 [I.V.:2081.6; NG/GT:1680; IV Piggyback:550] Out: [id number] [Urine:7450]  PHYSICAL EXAMINATION: General:  Elderly male. No distress. Sedated, eyes open to voice Neuro:  Pupils symmetric & pinpoint. Moving all 4 extremities equally. Follows commands x 4 HEENT:  Endotracheal tube in place. No scleral icterus.  Cardiovascular:  Regular rhythm & rate. No JVD. No edema. Lungs:  Clear breath sounds bilaterally with minimal secretions. Symmetric chest wall rise on ventilator.  Abdomen:  Soft. Protuberant. Normal bowel sounds.  Integument: Warm and dry. Mild erythema around  nose.  LABS:  BMET  Recent Labs Lab 11/14/15 0351 11/15/15 0430 11/16/15 0400  NA 143 141 140  K 3.7 3.3* 3.3*  CL 109 104 99*  CO2 29 30 34*  BUN 13 10 11   CREATININE 0.76 0.69 0.76  GLUCOSE 134* 143* 142*    Electrolytes  Recent Labs Lab 11/14/15 0351 11/15/15 0430 11/16/15 0400  CALCIUM 8.8* 9.1 9.3  MG 1.6* 1.8 1.6*  PHOS 4.5 4.6 5.2*    CBC  Recent Labs Lab 11/14/15 0351 11/15/15 0430 11/16/15 0400  WBC 5.9 5.6 7.3  HGB 7.4* 7.7* 8.3*  HCT 25.3* 26.2* 28.5*  PLT 286 284 359    Coag's  Recent Labs Lab 11/15/15 1315  APTT 35  INR 1.10    Sepsis Markers No results for input(s): LATICACIDVEN, PROCALCITON, O2SATVEN in the last 168 hours.  ABG  Recent Labs Lab 11/12/15 1520  PHART 7.424  PCO2ART 40.0  PO2ART 290*    Liver Enzymes  Recent Labs Lab 11/10/15 0528  11/14/15 0351 11/15/15 0430 11/16/15 0400  AST 27  --   --   --   --   ALT 18  --   --   --   --   ALKPHOS 111  --   --   --   --   BILITOT 0.5  --   --   --   --   ALBUMIN 2.1*  < > 1.9* 2.0* 2.2*  < > =  values in this interval not displayed.  Cardiac Enzymes No results for input(s): TROPONINI, PROBNP in the last 168 hours.  Glucose  Recent Labs Lab 11/15/15 0846 11/15/15 1121 11/15/15 1514 11/15/15 1924 11/15/15 2340 11/16/15 0339  GLUCAP 161* 171* 144* 135* 174* 154*    Imaging No results found.   STUDIES:  5/22 CT head: mild small vessel ischemic changes 5/24 Port CXR:  ETT & L IJ CVL in good position. No new focal opacity. 5/24 Port Renal U/S:  No mass or hydronephrosis. 5/25 Port CXR:  ETT in good position. OGT with tip in esopaghus. Low lung volumes. 5/25 CT Abd/Pelvis:  No abscess. Normal appendix. Splenomegaly noted. Inflammatory changes adjacent bladder. 5/26 MRI Spine:  C5-6 discitis osteomyelitis w/ trace epidural abscess. L4-5 facet cyst vs abscess 5/26 R Foot X-ray: Soft tissue ulcer at tip of the first distal phalanx. No evidence of  osteomyelitis and right foot. 5/27 Port CXR: Endotracheal tube in good position. NG tube coursing below diaphragm. Bilateral lower lung volumes.  MICROBIOLOGY: 5/22 Blood:  Negative  5/22 Urine:  E coli 5/23 Blood:  Negative  5/23 Urine:  Negative 5/25 Blood >> NGTD 5/25 Urine:  Negative 5/26 Trach Asp:  MRSA  ANTIBIOTICS: Zosyn 5/22>>> Cefepime 5/22 - 5/25 Rocephin 5/25 - 5/26 Vancomycin 5/22 - 5/24; 5/26>>  SIGNIFICANT EVENTS: 5/22 Admit  LINES/TUBES: OETT 7.5 5/22 - 5/27; 5/27 >> L IJ CVL 5/22 >> OGT 5/22 >> Foley 5/22 >> PIV x2  ASSESSMENT / PLAN:  PULMONARY A: Acute Hypoxic Respiratory Failure MRSA Pneumonia/Tracheobronchitis H/O COPD  P:   Breathing comfortably on PSV 30% 5/5 Prn BDs SVT & WUA daily Discussed need for trach with daughter yesterday at bedside  - planned for later this week  CARDIOVASCULAR A:  Shock - Likely Septic. Resolved. SVT - Fever related. H/O HTN & HLD  P:  Pressor need resolved CVP monitoring, goal > 10 Hold lisinopril, metoprolol Monitoring QTc daily on antipsychotics ----> QTc 464 Lasix 40 mg Q12   RENAL A:   Acute Renal Failure - Improving. Baseline Creatinine 0.98. Chronic urine retention Psuedohypocalcemia - Corrects to 9.6 (5/23) Hypokalemia - Resolved. Hypomagnesemia - Replacing IV.  P:   Trending UOP with Foley Monitoring electrolytes & renal function daily  - potassium replaced per protocol  - if continues to be low will need to add scheduled potassium while on lasix Continue flomax Lasix 40 mg BID---> -2.8 liters last 24 hours Goal 2 liters negative  GASTROINTESTINAL A:   Nutrition Constipation  P:   Continuing Tube feeds Protonix for SUP Senna BID VT  HEMATOLOGIC A:   Anemia - No signs of active bleeding. Suspect dilution. S/P 1u PRBC 5/24.  P:  Trending cell counts daily w/ CBC Heparin [place] q8hr Coags WNL SCDs Transfuse PRBC for HGB < 7  INFECTIOUS A:   Sepsis MRSA  Pneumonia/Tracheobronchitis C5-6 Discitis/Osteomyelitis w/ Trace Epidural Abscess/L4-5 Foraminal Cyst vs abscess E coli UTI H/O osteomyelitis with MRSA bacteremia  P:   ID Following Sputum culture + MRSA (final) Continue Vanc per ID (they recommend prolonged course 8 weeks)  ENDOCRINE A:   DM type II - BG controlled.  P:   Accu-Checks q4hr SSI per resistant algorithm Lantus 10u qhs HgBA1C in process Hold outpt amaryl, lantus, & victoza  NEUROLOGIC A:   C5-6 Discitis/Osteomyelitis w/ Trace Epidural Abscess & L4/5 Foraminal cyst vs abscess Acute Encephalopathy - Delirium. H/O chronic pain, peripheral neuropathy  P:   Neurosurgery Consulted Aspen Collar RASS goal: -  1 Fentanyl gtt- @150  mcg wean as tolerated Versed gtt- 1 mg/hr wean as tolerated Methadone 20 mg Q6 hours added 5/30  - requiring less IV sedation  - will titrate as needed Ativan 2mg  IV every 8 hours scheduled Increase Paxil and Lyrica back to home doses Seroquel bid & Haldol IV q6hr  FAMILY UPDATE:  Daughter last updated at bedside 5/30 by Dr Jabier Mutton SUMMARY:  55 yo male from NH with acute encephalopathy 2nd to septic shock from UTI, AKI, VDRF, and chronic opiate use.  He has hx of MRSA bacteremia with diffuse areas of osteomyelitis in February 2017. Admitted with E coli UTI. Patient continuing on vancomycin for osteomyelitis/discitis. Now has MRSA pneumonia/tracheobronchitis. Continuing vancomycin. ID following. Delirium seems to slowly be improving on Seroquel & Haldol IV. Restart home chronic pain meds and increase paxil and lyrica to home doses. Much less agitation this AM. Able to wean to PSV. Plan for trach this week.    Amalia Hailey FNP, ACNP-S 8:12 AM 11/16/2015  STAFF NOTE: Linwood Dibbles, MD FACP have personally reviewed patient's available data, including medical history, events of note, physical examination and test results as part of my evaluation. I have discussed with  resident/NP and other care providers such as pharmacist, RN and RRT. In addition, I personally evaluated patient and elicited key findings of: much improved with methadone and ativan , calm, reduced IV drip needs, QTC daily wnl, neg balance successful, maintain lasix same dose, suppk , SBT okay but plan trach in am after further d/w daughter, also attempted to discuss code status, d/w NS, can dc collar for trach and mild extension, pcxr in am re assess lytes in am, vanc per ID, prognosis poor overall, d/w daughter The patient is critically ill with multiple organ systems failure and requires high complexity decision making for assessment and support, frequent evaluation and titration of therapies, application of advanced monitoring technologies and extensive interpretation of multiple databases.   Critical Care Time devoted to patient care services described in this note is 35 Minutes. This time reflects time of care of this signee: Merrie Roof, MD FACP. This critical care time does not reflect procedure time, or teaching time or supervisory time of PA/NP/Med student/Med Resident etc but could involve care discussion time. Rest per NP/medical resident whose note is outlined above and that I agree with   Lavon Paganini. Titus Mould, MD, [place] Pgr: [facility] 11/16/2015 8:40 AM    UPDATE: he is much improved after methadone , weaning well, wil consider extubation trial, follows commands well, protects airway  Lavon Paganini. Titus Mould, MD, [place] Pgr: [place] [facility]

## 2015-11-16 NOTE — Progress Notes (Signed)
INFECTIOUS DISEASE PROGRESS NOTE   abtx Day # 11 ID: Gregory [name] is a 55 y.o. 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 successfully extubated but remains agitated, though lucid in answering questions. He states he has pain all over.  Abtx:  Anti-infectives    Start     Dose/Rate Route Frequency Ordered Stop   11/11/15 1000  vancomycin (VANCOCIN) IVPB 750 mg/150 ml premix     750 mg 150 mL/hr over 60 Minutes Intravenous Every 12 hours 11/11/15 0855     11/10/15 1400  cefTRIAXone (ROCEPHIN) 2 g in dextrose 5 % 50 mL IVPB  Status:  Discontinued     2 g 100 mL/hr over 30 Minutes Intravenous Daily 11/10/15 0808 11/11/15 1200   11/09/15 2200  ceFEPIme (MAXIPIME) 2 g in dextrose 5 % 50 mL IVPB  Status:  Discontinued     2 g 100 mL/hr over 30 Minutes Intravenous Every 12 hours 11/09/15 1024 11/09/15 1028   11/09/15 1800  ceFEPIme (MAXIPIME) 2 g in dextrose 5 % 50 mL IVPB  Status:  Discontinued     2 g 100 mL/hr over 30 Minutes Intravenous Every 12 hours 11/09/15 1028 11/10/15 0808   11/09/15 0500  vancomycin (VANCOCIN) 1,500 mg in sodium chloride 0.9 % 500 mL IVPB  Status:  Discontinued     1,500 mg 250 mL/hr over 120 Minutes Intravenous Every 48 hours 11/07/15 0648 11/09/15 1022   11/07/15 0800  ceFEPIme (MAXIPIME) 1 g in dextrose 5 % 50 mL IVPB  Status:  Discontinued     1 g 100 mL/hr over 30 Minutes Intravenous Daily 11/07/15 0648 11/09/15 1024   11/07/15 0230  vancomycin (VANCOCIN) 2,000 mg in sodium chloride 0.9 % 500 mL IVPB     2,000 mg 250 mL/hr over 120 Minutes Intravenous  Once 11/07/15 0223 11/07/15 0454   11/07/15 0215  piperacillin-tazobactam  (ZOSYN) IVPB 3.375 g     3.375 g 100 mL/hr over 30 Minutes Intravenous  Once 11/07/15 0212 11/07/15 0339   11/07/15 0215  vancomycin (VANCOCIN) IVPB 1000 mg/200 mL premix  Status:  Discontinued     1,000 mg 200 mL/hr over 60 Minutes Intravenous  Once 11/07/15 0212 11/07/15 0223      Medications:  Scheduled: . antiseptic oral rinse  7 mL Mouth Rinse 10 times per day  . chlorhexidine gluconate (SAGE KIT)  15 mL Mouth Rinse BID  . cloNIDine  0.1 mg Oral TID  . collagenase   Topical Daily  . feeding supplement (VITAL HIGH PROTEIN)  1,000 mL Per Tube Q24H  . furosemide  20 mg Intravenous Q12H  . haloperidol lactate      . haloperidol lactate  5 mg Intravenous Q6H  . heparin subcutaneous  5,000 Units Subcutaneous Q8H  . insulin aspart  0-20 Units Subcutaneous Q4H  . insulin glargine  10 Units Subcutaneous QHS  . LORazepam  2 mg Intravenous Q4H  . methadone  20 mg Oral Q6H  . pantoprazole sodium  40 mg Per Tube Q24H  . PARoxetine  40 mg Per Tube Daily  . pregabalin  150 mg Oral Q8H  . QUEtiapine  50 mg Oral BID  . sennosides  5 mL Per Tube BID  . tamsulosin  0.4 mg Oral Daily  . vancomycin  750 mg Intravenous Q12H    Objective: Vital signs in last 24 hours: Temp:  [100 F (37.8 C)-100.4 F (38 C)] 100.1 F (37.8 C) (05/31 1200) Pulse Rate:  [82-111] 103 (05/31 1200) Resp:  [10-23] 16 (05/31 1200) BP: (87-149)/(60-111) 125/74 mmHg (05/31 1200) SpO2:  [95 %-100 %] 97 % (05/31 1200) FiO2 (%):  [30 %] 30 % (05/31 0800) Weight:  [217 lb 14.4 oz (98.839 kg)] 217 lb 14.4 oz (98.839 kg) (05/31 0450)   General appearance: alert, moderate distress and agitated Resp: rhonchi bilaterally Cardio: regular rate and rhythm GI: normal findings: bowel sounds normal and soft, non-tender Extremities: superficial wound on R great toe. small amt of bleeding.   Lab Results  Recent Labs  11/15/15 0430 11/16/15 0400  WBC 5.6 7.3  HGB 7.7* 8.3*  HCT 26.2* 28.5*  NA 141 140  K 3.3* 3.3*    CL 104 99*  CO2 30 34*  BUN 10 11  CREATININE 0.69 0.76   Liver Panel  Recent Labs  11/15/15 0430 11/16/15 0400  ALBUMIN 2.0* 2.2*   Lab Results  Component Value Date   ESRSEDRATE >140* 11/11/2015   Lab Results  Component Value Date   CRP 13.7* 11/11/2015    Microbiology: Recent Results (from the past 240 hour(s))  Urine culture     Status: Abnormal   Collection Time: 11/07/15  2:23 AM  Result Value Ref Range Status   Specimen Description URINE, CATHETERIZED  Final   Special Requests NONE  Final   Culture >=100,000 COLONIES/mL ESCHERICHIA COLI (A)  Final   Report Status 11/09/2015 FINAL  Final   Organism ID, Bacteria ESCHERICHIA COLI (A)  Final      Susceptibility   Escherichia coli - MIC*    AMPICILLIN >=32 RESISTANT Resistant     CEFAZOLIN 8 SENSITIVE Sensitive     CEFTRIAXONE <=1 SENSITIVE Sensitive     CIPROFLOXACIN >=4 RESISTANT Resistant     GENTAMICIN >=16 RESISTANT Resistant     IMIPENEM <=0.25 SENSITIVE Sensitive     NITROFURANTOIN <=16 SENSITIVE Sensitive     TRIMETH/SULFA >=320 RESISTANT Resistant     AMPICILLIN/SULBACTAM >=32 RESISTANT Resistant     PIP/TAZO <=4 SENSITIVE Sensitive     * >=100,000 COLONIES/mL ESCHERICHIA COLI  Blood Culture (routine x 2)     Status: None   Collection Time: 11/07/15  2:25 AM  Result Value Ref Range Status   Specimen Description BLOOD RIGHT ANTECUBITAL  Final   Special Requests BOTTLES DRAWN AEROBIC ONLY 6CC  Final   Culture NO GROWTH 5 DAYS  Final   Report Status 11/12/2015 FINAL  Final  Blood Culture (routine x 2)     Status: None   Collection Time: 11/07/15  2:28 AM  Result Value Ref Range Status   Specimen Description BLOOD LEFT ANTECUBITAL  Final   Special Requests BOTTLES DRAWN AEROBIC ONLY 10CC  Final   Culture NO GROWTH 5 DAYS  Final   Report Status 11/12/2015 FINAL  Final  MRSA PCR Screening     Status: Abnormal   Collection Time: 11/07/15  8:25 AM  Result Value Ref Range Status   MRSA by PCR POSITIVE  (A) NEGATIVE Final    Comment:        The GeneXpert MRSA Assay (FDA approved for NASAL specimens only), is one component of a comprehensive  MRSA colonization surveillance program. It is not intended to diagnose MRSA infection nor to guide or monitor treatment for MRSA infections. RESULT CALLED TO, READ BACK BY AND VERIFIED WITH: [place] RN 9:55 11/07/15 ([email or web address])   Culture, blood (Routine X 2) w Reflex to ID Panel     Status: None   Collection Time: 11/08/15  5:16 PM  Result Value Ref Range Status   Specimen Description BLOOD LEFT ANTECUBITAL  Final   Special Requests BOTTLES DRAWN AEROBIC ONLY [place]  Final   Culture NO GROWTH 5 DAYS  Final   Report Status 11/13/2015 FINAL  Final  Culture, Urine     Status: None   Collection Time: 11/08/15  6:18 PM  Result Value Ref Range Status   Specimen Description URINE, CATHETERIZED  Final   Special Requests NONE  Final   Culture NO GROWTH  Final   Report Status 11/10/2015 FINAL  Final  Culture, blood (Routine X 2) w Reflex to ID Panel     Status: None   Collection Time: 11/08/15  7:18 PM  Result Value Ref Range Status   Specimen Description BLOOD RIGHT HAND  Final   Special Requests IN PEDIATRIC BOTTLE 2ML  Final   Culture NO GROWTH 5 DAYS  Final   Report Status 11/13/2015 FINAL  Final  Culture, blood (routine x 2)     Status: None   Collection Time: 11/10/15  3:27 PM  Result Value Ref Range Status   Specimen Description BLOOD RIGHT HAND  Final   Special Requests BOTTLES DRAWN AEROBIC ONLY 4CC  Final   Culture NO GROWTH 5 DAYS  Final   Report Status 11/15/2015 FINAL  Final  Culture, blood (routine x 2)     Status: None   Collection Time: 11/10/15  4:06 PM  Result Value Ref Range Status   Specimen Description BLOOD RIGHT HAND  Final   Special Requests IN PEDIATRIC BOTTLE 4CC  Final   Culture NO GROWTH 5 DAYS  Final   Report Status 11/15/2015 FINAL  Final  Culture, Urine     Status: None   Collection Time: 11/10/15  5:20 PM    Result Value Ref Range Status   Specimen Description URINE, CATHETERIZED  Final   Special Requests Rocephin T Cefepime Normal  Final   Culture NO GROWTH  Final   Report Status 11/11/2015 FINAL  Final  Culture, respiratory (NON-Expectorated)     Status: None   Collection Time: 11/11/15  8:50 AM  Result Value Ref Range Status   Specimen Description TRACHEAL ASPIRATE  Final   Special Requests Normal  Final   Gram Stain   Final    ABUNDANT WBC PRESENT,BOTH PMN AND MONONUCLEAR RARE SQUAMOUS EPITHELIAL CELLS PRESENT FEW GRAM POSITIVE COCCI IN PAIRS RARE GRAM POSITIVE RODS RARE GRAM NEGATIVE COCCOBACILLI    Culture   Final    ABUNDANT METHICILLIN RESISTANT STAPHYLOCOCCUS AUREUS   Report Status 11/13/2015 FINAL  Final   Organism ID, Bacteria METHICILLIN RESISTANT STAPHYLOCOCCUS AUREUS  Final      Susceptibility   Methicillin resistant staphylococcus aureus - MIC*    CIPROFLOXACIN >=8 RESISTANT Resistant     ERYTHROMYCIN >=8 RESISTANT Resistant     GENTAMICIN <=0.5 SENSITIVE Sensitive     OXACILLIN >=4 RESISTANT Resistant     TETRACYCLINE <=1 SENSITIVE Sensitive     VANCOMYCIN 1 SENSITIVE Sensitive     TRIMETH/SULFA <=10 SENSITIVE Sensitive     CLINDAMYCIN <=0.25 SENSITIVE Sensitive     RIFAMPIN <=0.5 SENSITIVE Sensitive  Inducible Clindamycin NEGATIVE Sensitive     * ABUNDANT METHICILLIN RESISTANT STAPHYLOCOCCUS AUREUS  vanco MIC 1  Studies/Results: No results found.   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 Extubated. Appears improving  -MRSA cervical/lumbar osteo = plan to treat for 56days. Currently on day 10 of 56. MRI imaging is difficult to know if his bony changes are residua from his prev  infection or ongoing infection. Given elevated inflammatory markers, would treat for relapse infection. Seen by neurosurgery already to provide recs. Review of his old records and micro results did not suggest that he had VRSA,though he had isolate with mic 2. He has had aki to vancomycin in the past, thus was placed on daptomycin as well as linezolid. He appears to be tolerating vancomycin, and his current isolate is sensitive. Would continue with vancomycin for now  -fever = possibly related to withdrawal as he is very agitated on exam.    For follow up, will see him back in ID clinic, but will need to follow up with Dr. Di Kindle, [facility] (pager) [phone] [email or web address] 11/16/2015, 3:30 PM  LOS: 9 days

## 2015-11-16 NOTE — Progress Notes (Signed)
Around 1400 pt who was up in the chair, became suddenly diaphoretic and agitated saying "I need help, I need pain meds. I can't sit still, please help." Pt HR elevated 120-130's, O2 sats >95% on room air, RR in the 20's. Breath sounds clear.  Pt returned back to bed and Dr Titus Mould paged and made aware of change. New order given.   [place] NP at bedside. Pt combative and very agitated. Pt profusely diaphoretic saying "help, help, help." New orders given and pt still very agitated and combative. RASS +4.  1630 Unable to leave pts bedside. Order for safety sitter and bilateral wrist restraints.  Dr Ashok Cordia at bedside and new orders given.  Will continue to monitor closely. Safety sitter at bedside. Pts daugher, Gregory [name], updated and made aware of events.

## 2015-11-16 NOTE — Progress Notes (Signed)
Speech Pathology:  Orders received for swallow evaluation; pt extubated this am.  Given prolonged oral intubation x 2, will defer assessment until next date; instrumental study will likely be needed.  Estill Llerena L. Tivis Ringer, [place] CCC/SLP Pager [phone]

## 2015-11-16 NOTE — Progress Notes (Signed)
[facility] ADULT ICU REPLACEMENT PROTOCOL FOR AM LAB REPLACEMENT ONLY  The patient does apply for the [facility] Adult ICU Electrolyte Replacment Protocol based on the criteria listed below:   1. Is GFR >/= 40 ml/min? Yes.    Patient's GFR today is >60 2. Is urine output >/= 0.5 ml/kg/hr for the last 6 hours? Yes.   Patient's UOP is 2.2 ml/kg/hr 3. Is BUN < 60 mg/dL? Yes.    Patient's BUN today is 11 4. Abnormal electrolyte(s):  K - 3.3, Mg - 1.6 5. Ordered repletion with: Per Protocol 6. If a panic level lab has been reported, has the CCM MD in charge been notified? Yes.  .   Physician:  Dr. Sheran Fava 11/16/2015 4:58 AM

## 2015-11-16 NOTE — Progress Notes (Signed)
eLink Physician-Brief Progress Note Patient Name: Gregory [name] DOB: Sep 24, 1960 MRN: [id number]   Date of Service  11/16/2015  HPI/Events of Note  Moderate agitation, started on precedex gtt @0 .4, had dec in HR and RR  eICU Interventions  Adjusted precedex gtt starting rate to 0.2     Intervention Category Minor Interventions: Agitation / anxiety - evaluation and management  Rayland Hamed 11/16/2015, 11:47 PM

## 2015-11-16 NOTE — Procedures (Signed)
Extubation Procedure Note  Patient Details:   Name: Gregory [name] DOB: January 06, 1961 MRN: [id number]   Airway Documentation:     Evaluation  O2 sats: stable throughout Complications: No apparent complications Patient did tolerate procedure well. Bilateral Breath Sounds: Clear, Diminished   Yes  PT was extubated to a 3L [place]  PT was able to speak and cough  Sats are stable  RT to monitor   Cas Tracz, Leonie Douglas 11/16/2015, 10:07 AM

## 2015-11-16 NOTE — Progress Notes (Signed)
PCCM Attending Note: Called the patient's bedside regarding worsening delirium. Patient currently not combative but also restrained. Altered mentation. Mildly increased work of breathing but overall stable respiratory status. I believe narcotics and benzodiazepines may be worsening the patient's delirium. Discontinuing methadone. Restarting fentanyl infusion with titration for pain relief and instructions to hold for sedation. Scheduling Haldol 10 mg IV every 6 hours. No enteral access at this time for Seroquel administration. Consider Zyprexa ODT if patient becomes more compliant. Continue close monitoring in the intensive care unit given potential need for reintubation.  Sonia Baller Ashok Cordia, M.D. [facility] Pager:  [phone] After 3pm or if no response, call [phone] 4:50 PM 11/16/2015

## 2015-11-16 NOTE — Progress Notes (Signed)
40cc of Fentanyl wasted in sink. 20cc of Versed wasted in sink. Both witnessed by Candie Mile, RN

## 2015-11-17 DIAGNOSIS — R509 Fever, unspecified: Secondary | ICD-10-CM

## 2015-11-17 DIAGNOSIS — J15212 Pneumonia due to Methicillin resistant Staphylococcus aureus: Secondary | ICD-10-CM

## 2015-11-17 LAB — GLUCOSE, CAPILLARY
GLUCOSE-CAPILLARY: 143 mg/dL — AB (ref 65–99)
GLUCOSE-CAPILLARY: 129 mg/dL — AB (ref 65–99)
GLUCOSE-CAPILLARY: 191 mg/dL — AB (ref 65–99)
Glucose-Capillary: 115 mg/dL — ABNORMAL HIGH (ref 65–99)
Glucose-Capillary: 185 mg/dL — ABNORMAL HIGH (ref 65–99)
Glucose-Capillary: 159 mg/dL — ABNORMAL HIGH (ref 65–99)

## 2015-11-17 LAB — RENAL FUNCTION PANEL
ALBUMIN: 2.5 g/dL — AB (ref 3.5–5.0)
Anion gap: 11 (ref 5–15)
BUN: 12 mg/dL (ref 6–20)
CO2: 32 mmol/L (ref 22–32)
Calcium: 9.8 mg/dL (ref 8.9–10.3)
Chloride: 96 mmol/L — ABNORMAL LOW (ref 101–111)
Creatinine, Ser: 1.06 mg/dL (ref 0.61–1.24)
GFR calc Af Amer: >60 mL/min (ref >60)
GLUCOSE: 113 mg/dL — AB (ref 65–99)
PHOSPHORUS: 4.7 mg/dL — AB (ref 2.5–4.6)
Potassium: 3.8 mmol/L (ref 3.5–5.1)
Sodium: 139 mmol/L (ref 135–145)

## 2015-11-17 LAB — HEMOGLOBIN A1C
Hgb A1c MFr Bld: 7.2 % — ABNORMAL HIGH (ref 4.8–5.6)
Mean Plasma Glucose: 160 mg/dL

## 2015-11-17 LAB — MAGNESIUM: MAGNESIUM: 1.6 mg/dL — AB (ref 1.7–2.4)

## 2015-11-17 MED ORDER — FUROSEMIDE 10 MG/ML IJ SOLN
20.0000 mg | Freq: Every day | INTRAMUSCULAR | Status: DC
  Administered 2015-11-18 – 2015-11-23 (×6): 20 mg via INTRAVENOUS
  Filled 2015-11-17 (×6): qty 2

## 2015-11-17 MED ORDER — SENNA 8.6 MG PO TABS
1.0000 | ORAL_TABLET | Freq: Two times a day (BID) | ORAL | Status: DC
  Administered 2015-11-17 – 2015-11-23 (×12): 8.6 mg via ORAL
  Filled 2015-11-17 (×13): qty 1

## 2015-11-17 MED ORDER — HALOPERIDOL LACTATE 5 MG/ML IJ SOLN
5.0000 mg | Freq: Four times a day (QID) | INTRAMUSCULAR | Status: DC | PRN
  Administered 2015-11-18: 5 mg via INTRAVENOUS
  Filled 2015-11-17: qty 1

## 2015-11-17 MED ORDER — OXYCODONE HCL ER 10 MG PO T12A: 90.0000 mg | EXTENDED_RELEASE_TABLET | Freq: Two times a day (BID) | ORAL | Status: DC

## 2015-11-17 MED ORDER — MAGNESIUM SULFATE 2 GM/50ML IV SOLN
2.0000 g | Freq: Once | INTRAVENOUS | Status: AC
  Administered 2015-11-17: 2 g via INTRAVENOUS
  Filled 2015-11-17: qty 50

## 2015-11-17 MED ORDER — OXYCODONE HCL ER 10 MG PO T12A
90.0000 mg | EXTENDED_RELEASE_TABLET | Freq: Two times a day (BID) | ORAL | Status: DC
  Administered 2015-11-17 – 2015-11-18 (×3): 90 mg via ORAL
  Filled 2015-11-17: qty 1
  Filled 2015-11-17: qty 4
  Filled 2015-11-17 (×2): qty 1

## 2015-11-17 MED ORDER — ALTEPLASE 2 MG IJ SOLR
2.0000 mg | Freq: Once | INTRAMUSCULAR | Status: DC
  Filled 2015-11-17: qty 2

## 2015-11-17 MED ORDER — OXYCODONE HCL ER 10 MG PO T12A
90.0000 mg | EXTENDED_RELEASE_TABLET | Freq: Two times a day (BID) | ORAL | Status: DC
  Administered 2015-11-17: 90 mg via ORAL

## 2015-11-17 MED ORDER — RISPERIDONE 0.5 MG PO TBDP
0.5000 mg | ORAL_TABLET | Freq: Two times a day (BID) | ORAL | Status: DC
  Administered 2015-11-17 – 2015-11-18 (×3): 0.5 mg via ORAL
  Filled 2015-11-17 (×5): qty 1

## 2015-11-17 MED ORDER — ANTISEPTIC ORAL RINSE SOLUTION (CORINZ): 7.0000 mL | OROMUCOSAL | Status: DC | PRN

## 2015-11-17 MED ORDER — PANTOPRAZOLE SODIUM 40 MG PO TBEC
40.0000 mg | DELAYED_RELEASE_TABLET | Freq: Every day | ORAL | Status: DC
  Administered 2015-11-18 – 2015-12-02 (×13): 40 mg via ORAL
  Filled 2015-11-17 (×14): qty 1

## 2015-11-17 MED ORDER — OXYCODONE HCL ER 10 MG PO T12A
90.0000 mg | EXTENDED_RELEASE_TABLET | Freq: Two times a day (BID) | ORAL | Status: DC
  Filled 2015-11-17: qty 9

## 2015-11-17 NOTE — Progress Notes (Signed)
PT Cancellation Note  Patient Details Name: Gregory [name] MRN: [id number] DOB: Apr 25, 1961   Cancelled Treatment:    Reason Eval/Treat Not Completed: Medical issues which prohibited therapy. Pt given sedating meds and nursing requested defer until tomorrow.   Sabrine Patchen 11/17/2015, 2:29 PM [facility] PT [phone]

## 2015-11-17 NOTE — Progress Notes (Signed)
eLink Physician-Brief Progress Note Patient Name: Gregory [name] DOB: [birth year] MRN: [id number]   Date of Service  11/17/2015  HPI/Events of Note  [name] agitation appears to have improved with addition of scheduled narcotics  eICU Interventions  Changed scheduled haldol to 5mg  prn (from scheduled)      Intervention Category Minor Interventions: Agitation / anxiety - evaluation and management  BYRUM,ROBERT S. 11/17/2015, 5:24 PM

## 2015-11-17 NOTE — Progress Notes (Signed)
Called to instill alteplase to L IJ TL. Two ports unable to be flushed. One with sluggish flush, no port with blood return. This has gotten worse as shift has passed per primary RN. Neck collar pressing insertion site from what is visible. Unable to assess site for mechanical obstruction. Primary RN also states that the patient wouldn't be able to wait the possible 2 hour dwell time. Alteplase not instilled at this time due to lack of alternate access for dwell time. Recommend PICC line placement due to collar and neck restrictions.

## 2015-11-17 NOTE — Progress Notes (Addendum)
[place] for Infectious Disease   Reason for visit: Follow up on discitis  Interval History:  Less agitated.  Asking for something for anxiety.  Breathing ok. Tmax 102.   Pain.  No rashes.   Physical Exam: Constitutional:  Filed Vitals:   11/17/15 0800 11/17/15 0829  BP: 124/87   Pulse: 85   Temp:  97.5 F (36.4 C)  Resp: 17    patient appears in NAD Respiratory: Normal respiratory effort; CTA B Cardiovascular: RRR  Review of Systems: Constitutional: negative for chills Behavioral/Psych: positive for anxiety  Lab Results  Component Value Date   WBC 7.3 11/16/2015   HGB 8.3* 11/16/2015   HCT 28.5* 11/16/2015   MCV 88.0 11/16/2015   PLT 359 11/16/2015    Lab Results  Component Value Date   CREATININE 1.06 11/17/2015   BUN 12 11/17/2015   NA 139 11/17/2015   K 3.8 11/17/2015   CL 96* 11/17/2015   CO2 32 11/17/2015    Lab Results  Component Value Date   ALT 18 11/10/2015   AST 27 11/10/2015   ALKPHOS 111 11/10/2015     Microbiology: Recent Results (from the past 240 hour(s))  Culture, blood (Routine X 2) w Reflex to ID Panel     Status: None   Collection Time: 11/08/15  5:16 PM  Result Value Ref Range Status   Specimen Description BLOOD LEFT ANTECUBITAL  Final   Special Requests BOTTLES DRAWN AEROBIC ONLY [place]  Final   Culture NO GROWTH 5 DAYS  Final   Report Status 11/13/2015 FINAL  Final  Culture, Urine     Status: None   Collection Time: 11/08/15  6:18 PM  Result Value Ref Range Status   Specimen Description URINE, CATHETERIZED  Final   Special Requests NONE  Final   Culture NO GROWTH  Final   Report Status 11/10/2015 FINAL  Final  Culture, blood (Routine X 2) w Reflex to ID Panel     Status: None   Collection Time: 11/08/15  7:18 PM  Result Value Ref Range Status   Specimen Description BLOOD RIGHT HAND  Final   Special Requests IN PEDIATRIC BOTTLE 2ML  Final   Culture NO GROWTH 5 DAYS  Final   Report Status 11/13/2015 FINAL  Final    Culture, blood (routine x 2)     Status: None   Collection Time: 11/10/15  3:27 PM  Result Value Ref Range Status   Specimen Description BLOOD RIGHT HAND  Final   Special Requests BOTTLES DRAWN AEROBIC ONLY 4CC  Final   Culture NO GROWTH 5 DAYS  Final   Report Status 11/15/2015 FINAL  Final  Culture, blood (routine x 2)     Status: None   Collection Time: 11/10/15  4:06 PM  Result Value Ref Range Status   Specimen Description BLOOD RIGHT HAND  Final   Special Requests IN PEDIATRIC BOTTLE 4CC  Final   Culture NO GROWTH 5 DAYS  Final   Report Status 11/15/2015 FINAL  Final  Culture, Urine     Status: None   Collection Time: 11/10/15  5:20 PM  Result Value Ref Range Status   Specimen Description URINE, CATHETERIZED  Final   Special Requests Rocephin T Cefepime Normal  Final   Culture NO GROWTH  Final   Report Status 11/11/2015 FINAL  Final  Culture, respiratory (NON-Expectorated)     Status: None   Collection Time: 11/11/15  8:50 AM  Result Value Ref Range Status  Specimen Description TRACHEAL ASPIRATE  Final   Special Requests Normal  Final   Gram Stain   Final    ABUNDANT WBC PRESENT,BOTH PMN AND MONONUCLEAR RARE SQUAMOUS EPITHELIAL CELLS PRESENT FEW GRAM POSITIVE COCCI IN PAIRS RARE GRAM POSITIVE RODS RARE GRAM NEGATIVE COCCOBACILLI    Culture   Final    ABUNDANT METHICILLIN RESISTANT STAPHYLOCOCCUS AUREUS   Report Status 11/13/2015 FINAL  Final   Organism ID, Bacteria METHICILLIN RESISTANT STAPHYLOCOCCUS AUREUS  Final      Susceptibility   Methicillin resistant staphylococcus aureus - MIC*    CIPROFLOXACIN >=8 RESISTANT Resistant     ERYTHROMYCIN >=8 RESISTANT Resistant     GENTAMICIN <=0.5 SENSITIVE Sensitive     OXACILLIN >=4 RESISTANT Resistant     TETRACYCLINE <=1 SENSITIVE Sensitive     VANCOMYCIN 1 SENSITIVE Sensitive     TRIMETH/SULFA <=10 SENSITIVE Sensitive     CLINDAMYCIN <=0.25 SENSITIVE Sensitive     RIFAMPIN <=0.5 SENSITIVE Sensitive     Inducible  Clindamycin NEGATIVE Sensitive     * ABUNDANT METHICILLIN RESISTANT STAPHYLOCOCCUS AUREUS    Impression/Plan:  1. MRSA pneumonia - on vancomycin. 2.  MRSA cervical/lumbar osteo.  Day 11/56 of vancomycin.  Surgery once medically improves.   3.  Febrile - tmax 102.  May be related to withdrawal, no fever today.    Will follow up intermittently

## 2015-11-17 NOTE — Progress Notes (Signed)
Difficulty drawing labs from, and infusing meds thru CVL.  Discussed the need to maintain current CVL with E-link due to possible re-intubation, and continued need for sedation.  New E-link order for IV team consult for TPA.  Upon assessment by IV team, CVL found to be positional due to C-collar, and received recommendation for PICC.  Left PIV placed by IV team.  Will continue to monitor.

## 2015-11-17 NOTE — Progress Notes (Signed)
PULMONARY / CRITICAL CARE MEDICINE   Name: Gregory [name] MRN: [id number] DOB: April 04, 1961    ADMISSION DATE:  11/07/2015  REFERRING MD:  Dr. Leonides Schanz  CHIEF COMPLAINT:  Alerted mental status  HISTORY OF PRESENT ILLNESS:    55 yo male brought to ER with altered mental status.  He resides at [facility].  He was hypoxic, diaphoretic.  He was given narcan and then became agitated.  He was given ativan, and became sedated.  He was noted to have fever, low BP.  He was intubated for airway protection.  He had purulent appearing urine.  He was started on antibiotics, IV fluids, and pressors. He was tx for MRSA bacteremia in February 2017 and multifocal osteomyelitis of Lt foot, Lt shoulder, and spine.  SUBJECTIVE:  NAEON Intermittently agitated.  On minimal precedex drip this am.  Febrile 102 yesterday.  Continues to withdraw.    VITAL SIGNS: BP 120/72 mmHg  Pulse 80  Temp(Src) 97.5 F (36.4 C) (Oral)  Resp 17  Ht 6' (1.829 m)  Wt 200 lb (90.719 kg)  BMI 27.12 kg/m2  SpO2 99%  HEMODYNAMICS:    VENTILATOR SETTINGS:    INTAKE / OUTPUT: I/O last 3 completed shifts: In: 2553.5 [I.V.:1343.5; NG/GT:710; IV Piggyback:500] Out: 6600 [Urine:6600]  PHYSICAL EXAMINATION: General:  Elderly male. irritable Neuro:  Pupils symmetric 72mm, Moving all 4 extremities equally. Follows commands x 4 HEENT:  Collar in place Cardiovascular:  Regular rhythm & rate. No JVD. No edema. Lungs:  Clear breath sounds bilaterally with minimal secretions  Abdomen:  Soft. Protuberant. Normal bowel sounds.  Integument: Warm and dry. Mild erythema around nose.  LABS:  BMET  Recent Labs Lab 11/15/15 0430 11/16/15 0400 11/17/15 0419  NA 141 140 139  K 3.3* 3.3* 3.8  CL 104 99* 96*  CO2 30 34* 32  BUN 10 11 12   CREATININE 0.69 0.76 1.06  GLUCOSE 143* 142* 113*    Electrolytes  Recent Labs Lab 11/15/15 0430 11/16/15 0400 11/17/15 0419  CALCIUM 9.1 9.3 9.8  MG 1.8 1.6* 1.6*  PHOS 4.6 5.2*  4.7*    CBC  Recent Labs Lab 11/14/15 0351 11/15/15 0430 11/16/15 0400  WBC 5.9 5.6 7.3  HGB 7.4* 7.7* 8.3*  HCT 25.3* 26.2* 28.5*  PLT 286 284 359    Coag's  Recent Labs Lab 11/15/15 1315  APTT 35  INR 1.10    Sepsis Markers No results for input(s): LATICACIDVEN, PROCALCITON, O2SATVEN in the last 168 hours.  ABG  Recent Labs Lab 11/12/15 1520  PHART 7.424  PCO2ART 40.0  PO2ART 290*    Liver Enzymes  Recent Labs Lab 11/15/15 0430 11/16/15 0400 11/17/15 0419  ALBUMIN 2.0* 2.2* 2.5*    Cardiac Enzymes No results for input(s): TROPONINI, PROBNP in the last 168 hours.  Glucose  Recent Labs Lab 11/16/15 1132 11/16/15 1611 11/16/15 1935 11/16/15 2307 11/17/15 0327 11/17/15 0827  GLUCAP 139* 173* 121* 143* 129* 115*    Imaging No results found.   STUDIES:  5/22 CT head: mild small vessel ischemic changes 5/24 Port CXR:  ETT & L IJ CVL in good position. No new focal opacity. 5/24 Port Renal U/S:  No mass or hydronephrosis. 5/25 Port CXR:  ETT in good position. OGT with tip in esopaghus. Low lung volumes. 5/25 CT Abd/Pelvis:  No abscess. Normal appendix. Splenomegaly noted. Inflammatory changes adjacent bladder. 5/26 MRI Spine:  C5-6 discitis osteomyelitis w/ trace epidural abscess. L4-5 facet cyst vs abscess 5/26 R Foot  X-ray: Soft tissue ulcer at tip of the first distal phalanx. No evidence of osteomyelitis and right foot. 5/27 Port CXR: Endotracheal tube in good position. NG tube coursing below diaphragm. Bilateral lower lung volumes.  MICROBIOLOGY: 5/22 Blood:  Negative  5/22 Urine:  E coli 5/23 Blood:  Negative  5/23 Urine:  Negative 5/25 Blood >> NGTD 5/25 Urine:  Negative 5/26 Trach Asp:  MRSA  ANTIBIOTICS: Zosyn 5/22>>>off Cefepime 5/22 - 5/25 Rocephin 5/25 - 5/26 Vancomycin 5/22 - 5/24; 5/26>>  SIGNIFICANT EVENTS: 5/22 Admit 5/31- extubated, confusion, agitation WD?, precedex  LINES/TUBES: OETT 7.5 5/22 - 5/27; 5/27  >> L IJ CVL 5/22 >>fever 6/1 OGT 5/22 >> Foley 5/22 >> PIV x2  ASSESSMENT / PLAN:  PULMONARY A: Acute Hypoxic Respiratory Failure MRSA Pneumonia/Tracheobronchitis H/O COPD  P:   Weaned / Extubated 5/31 IS Pneumonia/ osteo with vancomycin.   CARDIOVASCULAR A:  Shock - Likely Septic. Resolved. SVT - Fever related. H/O HTN & HLD  P:  Pressor need resolved CVP dc Hold lisinopril, metoprolol Monitoring QTc daily on antipsychotics Lasix 40 mg Q12   RENAL A:   Acute Renal Failure - Improving. Baseline Creatinine 0.98. Chronic urine retention Psuedohypocalcemia - Corrects to 9.6 (5/23) Hypokalemia - Resolved. Hypomagnesemia - Replacing IV.  P:   Monitoring electrolytes & renal function daily  - potassium replaced per protocol   - if continues to be low will need to add scheduled potassium while on lasix  - Mag supp today Continue flomax  GASTROINTESTINAL A:   Nutrition Constipation  P:   PO as able.  Protonix for SUP Senna BID VT  HEMATOLOGIC A:   Anemia - No signs of active bleeding. Suspect dilution. S/P 1u PRBC 5/24.  P:  Trending cell counts daily w/ CBC - trending up.  Heparin [place] q8hr Coags WNL SCDs Transfuse PRBC for HGB < 7  INFECTIOUS A:   Sepsis MRSA Pneumonia/Tracheobronchitis C5-6 Discitis/Osteomyelitis w/ Trace Epidural Abscess/L4-5 Foraminal Cyst vs abscess E coli UTI H/O osteomyelitis with MRSA bacteremia Vanco day 11/56  P:   ID Following Sputum culture + MRSA (final) Continue Vanc per ID (they recommend prolonged course 8 weeks)  ENDOCRINE A:   DM type II - BG controlled.  P:   Accu-Checks q4hr SSI per resistant algorithm Lantus 10u qhs HgBA1C in process Hold outpt amaryl, lantus, & victoza  NEUROLOGIC A:   C5-6 Discitis/Osteomyelitis w/ Trace Epidural Abscess & L4/5 Foraminal cyst vs abscess Acute Encephalopathy - Delirium. H/O chronic pain, peripheral neuropathy  P:   Neurosurgery Consulted [place]  Collar RASS goal: 0 Fentanyl gtt- @150  mcg wean as tolerated  Precedex - wean as tolerated Methadone d/c'd Clonidine 0.1 mg TID- dc Dilaudid 1mg  q6 Ativan d/c'd Increase Paxil and Lyrica back to home doses Seroquel bid & Haldol IV q6hr  FAMILY UPDATE:  Daughter last updated at bedside 5/30 by Dr Jabier Mutton SUMMARY:  55 yo male from NH with acute encephalopathy 2nd to septic shock from UTI, AKI, VDRF, and chronic opiate use.  He has hx of MRSA bacteremia with diffuse areas of osteomyelitis in February 2017. Admitted with E coli UTI. Patient continuing on vancomycin for osteomyelitis/discitis. Now has MRSA pneumonia/tracheobronchitis. Continuing vancomycin. ID following. Delirium seems to slowly be improving on Seroquel & Haldol IV, able to wean off methadone, precedex minimal. Restarted home chronic pain meds, and home pregabalin and paxil. Much less agitation this AM.   STAFF NOTE: I, Merrie Roof, MD FACP have personally reviewed patient's available  data, including medical history, events of note, physical examination and test results as part of my evaluation. I have discussed with resident/NP and other care providers such as pharmacist, RN and RRT. In addition, I personally evaluated patient and elicited key findings of no distress, concern was narc WD, however was on methadone, paradoxical affect from methadone?, doing better with dilauded, add fent patch, increase add rispirdal, wean precedex to off, he is calmer, fevers noted, dc line at risk seeding as placed during bacteremia, line holiday as able, consider to sdu, haldol monitor qtc The patient is critically ill with multiple organ systems failure and requires high complexity decision making for assessment and support, frequent evaluation and titration of therapies, application of advanced monitoring technologies and extensive interpretation of multiple databases.   Critical Care Time devoted to patient care services  described in this note is 30 Minutes. This time reflects time of care of this signee: Merrie Roof, MD FACP. This critical care time does not reflect procedure time, or teaching time or supervisory time of PA/NP/Med student/Med Resident etc but could involve care discussion time. Rest per NP/medical resident whose note is outlined above and that I agree with   Lavon Paganini. Titus Mould, MD, [place] Pgr: [facility] 11/17/2015 11:41 AM

## 2015-11-17 NOTE — Progress Notes (Signed)
Dr. Stevenson Clinch made aware of IV team consult recommendation for PICC placement.  Will continue to monitor.

## 2015-11-17 NOTE — Evaluation (Signed)
Clinical/Bedside Swallow Evaluation Patient Details  Name: Gregory [name] MRN: [id number] Date of Birth: 1960/10/20  Today's Date: 11/17/2015 Time: SLP Start Time (ACUTE ONLY): [id number] SLP Stop Time (ACUTE ONLY): 0950 SLP Time Calculation (min) (ACUTE ONLY): 22 min  Past Medical History:  Past Medical History  Diagnosis Date  . Diabetes mellitus without complication ([place])   . Hypertension   . DDD (degenerative disc disease), lumbar   . Osteomyelitis ([place])   . Urinary retention   . COPD (chronic obstructive pulmonary disease) ([place])   . Osteomyelitis ([place])   . MRSA bacteremia   . Chronic back pain   . Anemia   . Acute respiratory failure ([place])   . Urinary retention    Past Surgical History: History reviewed. No pertinent past surgical history. HPI:  55 yo male from SNF with acute encephalopathy 2nd to septic shock from UTI, AKI, VDRF, and chronic opiate use. Pt was intubated 5/22-5/27, 5/27-5/31. He has hx of MRSA bacteremia with diffuse areas of osteomyelitis in February 2017. Pt had prior swallowing evaluations in 2013 after prolonged intubation and trach with advancement from Dys 3 diet and nectar thick liquids to regular diet and thin liquids prior to discharge.   Assessment / Plan / Recommendation Clinical Impression  Despite prolonged intubation, pt's voice is loud/clear and cough is strong. No overt s/s of aspiration noted across any consistencies, including challenging with the 3 ounce water test and mixed consistency trials of pills and water. Swallow appears to occur swiftly and no subswallows are observed. Recommend to initiate Dys 3 diet and thin liquids with full supervision given risk for aspiration from prolonged intubation and altered mentation. Will f/u for tolerance and advancement when ready.    Aspiration Risk  Mild aspiration risk    Diet Recommendation Dysphagia 3 (Mech soft);Thin liquid   Liquid Administration via: Cup;Straw Medication Administration: Whole meds  with liquid Supervision: Patient able to self feed;Full supervision/cueing for compensatory strategies Compensations: Minimize environmental distractions;Slow rate;Small sips/bites Postural Changes: Seated upright at 90 degrees;Remain upright for at least 30 minutes after po intake    Other  Recommendations Oral Care Recommendations: Oral care BID   Follow up Recommendations   (tba)    Frequency and Duration min 2x/week  2 weeks       Prognosis Prognosis for Safe Diet Advancement: Good      Swallow Study   General HPI: 55 yo male from SNF with acute encephalopathy 2nd to septic shock from UTI, AKI, VDRF, and chronic opiate use. Pt was intubated 5/22-5/27, 5/27-5/31. He has hx of MRSA bacteremia with diffuse areas of osteomyelitis in February 2017. Pt had prior swallowing evaluations in 2013 after prolonged intubation and trach with advancement from Dys 3 diet and nectar thick liquids to regular diet and thin liquids prior to discharge. Type of Study: Bedside Swallow Evaluation Previous Swallow Assessment: see HPI Diet Prior to this Study: NPO Temperature Spikes Noted: Yes (102.1) Respiratory Status: Nasal cannula History of Recent Intubation: Yes Length of Intubations (days): 9 days Date extubated: 11/16/15 Behavior/Cognition: Alert;Cooperative;Pleasant mood Oral Cavity Assessment: Within Functional Limits Oral Care Completed by SLP: No Oral Cavity - Dentition: Adequate natural dentition Patient Positioning: Upright in bed Baseline Vocal Quality: Normal Volitional Cough: Strong Volitional Swallow: Able to elicit    Oral/Motor/Sensory Function Overall Oral Motor/Sensory Function: Within functional limits   Ice Chips Ice chips: Within functional limits Presentation: Spoon   Thin Liquid Thin Liquid: Impaired Presentation: Straw Pharyngeal  Phase Impairments: Suspected delayed Swallow  Nectar Thick Nectar Thick Liquid: Not tested   Honey Thick Honey Thick Liquid: Not  tested   Puree Puree: Within functional limits Presentation: Spoon   Solid   GO   Solid: Within functional limits       Germain Osgood, M.A. CCC-SLP [phone]  Germain Osgood 11/17/2015,10:08 AM

## 2015-11-17 NOTE — Progress Notes (Signed)
Patient repetitively demanding Demerol, "Roxies," and Oxy throughout the shift.  E-link consulted for sedation requirements as patient is requiring Precedex, and Fentanyl infusion due to RASS of 4-7.  Pt has  tolerated well, with respiratory status stable on 3lNC.  Unable to draw am labs from CVL at this time.  During insertion of PIV by IV team, blood collected and sent for CMP as patient refusing phlebotomy.  Triglycerides, CBC with differential and platelets pending.  Will continue to monitor.

## 2015-11-18 DIAGNOSIS — E785 Hyperlipidemia, unspecified: Secondary | ICD-10-CM

## 2015-11-18 DIAGNOSIS — I10 Essential (primary) hypertension: Secondary | ICD-10-CM

## 2015-11-18 DIAGNOSIS — R7881 Bacteremia: Secondary | ICD-10-CM

## 2015-11-18 DIAGNOSIS — I272 Other secondary pulmonary hypertension: Secondary | ICD-10-CM

## 2015-11-18 DIAGNOSIS — E118 Type 2 diabetes mellitus with unspecified complications: Secondary | ICD-10-CM

## 2015-11-18 DIAGNOSIS — E1165 Type 2 diabetes mellitus with hyperglycemia: Secondary | ICD-10-CM

## 2015-11-18 LAB — RENAL FUNCTION PANEL
ALBUMIN: 2.5 g/dL — AB (ref 3.5–5.0)
Anion gap: 12 (ref 5–15)
BUN: 15 mg/dL (ref 6–20)
CHLORIDE: 94 mmol/L — AB (ref 101–111)
CO2: 32 mmol/L (ref 22–32)
Calcium: 9.5 mg/dL (ref 8.9–10.3)
Creatinine, Ser: 1.17 mg/dL (ref 0.61–1.24)
GFR calc Af Amer: >60 mL/min (ref >60)
GFR calc non Af Amer: >60 mL/min (ref >60)
GLUCOSE: 117 mg/dL — AB (ref 65–99)
POTASSIUM: 3.5 mmol/L (ref 3.5–5.1)
Phosphorus: 5.3 mg/dL — ABNORMAL HIGH (ref 2.5–4.6)
Sodium: 138 mmol/L (ref 135–145)

## 2015-11-18 LAB — CBC WITH DIFFERENTIAL/PLATELET
BASOS ABS: 0 10*3/uL (ref 0.0–0.1)
Basophils Relative: 0 %
Eosinophils Absolute: 0.3 10*3/uL (ref 0.0–0.7)
Eosinophils Relative: 3 %
HCT: 31.4 % — ABNORMAL LOW (ref 39.0–52.0)
HEMOGLOBIN: 9.3 g/dL — AB (ref 13.0–17.0)
LYMPHS ABS: 2.5 10*3/uL (ref 0.7–4.0)
LYMPHS PCT: 26 %
MCH: 25.6 pg — AB (ref 26.0–34.0)
MCHC: 29.6 g/dL — ABNORMAL LOW (ref 30.0–36.0)
MCV: 86.5 fL (ref 78.0–100.0)
Monocytes Absolute: 0.6 10*3/uL (ref 0.1–1.0)
Monocytes Relative: 6 %
Neutro Abs: 6.2 10*3/uL (ref 1.7–7.7)
Neutrophils Relative %: 65 %
Platelets: 298 10*3/uL (ref 150–400)
RBC: 3.63 MIL/uL — AB (ref 4.22–5.81)
RDW: 17 % — ABNORMAL HIGH (ref 11.5–15.5)
WBC: 9.6 10*3/uL (ref 4.0–10.5)

## 2015-11-18 LAB — TRIGLYCERIDES: Triglycerides: 205 mg/dL — ABNORMAL HIGH (ref <150)

## 2015-11-18 LAB — GLUCOSE, CAPILLARY
GLUCOSE-CAPILLARY: 149 mg/dL — AB (ref 65–99)
GLUCOSE-CAPILLARY: 164 mg/dL — AB (ref 65–99)
GLUCOSE-CAPILLARY: 227 mg/dL — AB (ref 65–99)
Glucose-Capillary: 100 mg/dL — ABNORMAL HIGH (ref 65–99)
Glucose-Capillary: 133 mg/dL — ABNORMAL HIGH (ref 65–99)
Glucose-Capillary: 215 mg/dL — ABNORMAL HIGH (ref 65–99)
Glucose-Capillary: 220 mg/dL — ABNORMAL HIGH (ref 65–99)

## 2015-11-18 LAB — MAGNESIUM: Magnesium: 2 mg/dL (ref 1.7–2.4)

## 2015-11-18 MED ORDER — DIAZEPAM 5 MG/ML IJ SOLN
5.0000 mg | INTRAMUSCULAR | Status: DC | PRN
  Administered 2015-11-18: 5 mg via INTRAVENOUS
  Administered 2015-11-19 (×2): 10 mg via INTRAVENOUS
  Administered 2015-11-19 – 2015-11-20 (×2): 5 mg via INTRAVENOUS
  Filled 2015-11-18 (×7): qty 2

## 2015-11-18 MED ORDER — INSULIN GLARGINE 100 UNIT/ML ~~LOC~~ SOLN
15.0000 [IU] | Freq: Every day | SUBCUTANEOUS | Status: DC
  Administered 2015-11-18 – 2015-11-25 (×8): 15 [IU] via SUBCUTANEOUS
  Filled 2015-11-18 (×10): qty 0.15

## 2015-11-18 MED ORDER — HALOPERIDOL LACTATE 5 MG/ML IJ SOLN
INTRAMUSCULAR | Status: AC
Start: 2015-11-18 — End: 2015-11-18
  Administered 2015-11-18: 5 mg via INTRAVENOUS
  Filled 2015-11-18: qty 1

## 2015-11-18 MED ORDER — HALOPERIDOL LACTATE 5 MG/ML IJ SOLN
5.0000 mg | Freq: Two times a day (BID) | INTRAMUSCULAR | Status: DC
Start: 2015-11-18 — End: 2015-11-21
  Administered 2015-11-18 – 2015-11-21 (×6): 5 mg via INTRAVENOUS
  Filled 2015-11-18 (×5): qty 1

## 2015-11-18 MED ORDER — ENSURE ENLIVE PO LIQD
237.0000 mL | Freq: Two times a day (BID) | ORAL | Status: DC
  Administered 2015-11-19 – 2015-11-22 (×4): 237 mL via ORAL

## 2015-11-18 NOTE — Progress Notes (Signed)
Pharmacy Antibiotic Note  Gregory [name] is a 55 y.o. male admitted on 11/07/2015 with disseminated MRSA infection, including bacteremia, osteomyelitis, discitis, and pneumonia.  Pharmacy is currently consulted for Vancomycin dosing.  He has had a prolonged treatment course for his MRSA infections dating back to February 2017.  Vancomycin trough was therapeutic 5/30 on Vancomycin 750mg  IV q12h.  His SCr is starting to trend up.  Watch closely.    Plan: Vancomycin 750 IV every 12 hours.  Goal trough 15-20 mcg/mL.  Weekly trough (due Tues 6/6 but may need to do sooner if SCr continues to rise) Monitor renal function closely  Height: 6' (182.9 cm) Weight: (!) 393 lb 15.4 oz (178.7 kg) IBW/kg (Calculated) : 77.6  Temp (24hrs), Avg:98.6 F (37 C), Min:97.9 F (36.6 C), Max:99.5 F (37.5 C)   Recent Labs Lab 11/13/15 0431 11/14/15 0351 11/15/15 0430 11/15/15 1016 11/16/15 0400 11/17/15 0419 11/18/15 0358  WBC 6.0 5.9 5.6  --  7.3  --  9.6  CREATININE 0.83 0.76 0.69  --  0.76 1.06 1.17  VANCOTROUGH  --   --   --  15  --   --   --     Estimated Creatinine Clearance: 117.7 mL/min (by C-G formula based on Cr of 1.17).    No Known Allergies  Antimicrobials this admission: 5/22 Vanc >>5/24; 5/26>> (7/21? - 8 weeks per ID) -5/30 VT 15 on 750 q12h 5/22 Cefepime >>5/24 5/24 Ceftriaxone>>5/26   Recent Microbiology results: 5/23 Blood cultures - negative 5/25 Blood cultures - negative 5/26 Trach aspirate - MRSA  Thank you for allowing pharmacy to be a part of this patient's care.  [employer], Pharm.D., BCPS Clinical Pharmacist Pager [phone] 11/18/2015 2:28 PM

## 2015-11-18 NOTE — Progress Notes (Signed)
[facility] for Infectious Disease   Reason for visit: Follow up on discitis  Interval History:  Less agitated.  Afebrile.   Physical Exam: Constitutional:  Filed Vitals:   11/18/15 0800 11/18/15 0900  BP: 141/92   Pulse: 103 120  Temp:    Resp: 16 21   patient appears in NAD Respiratory: Normal respiratory effort; CTA B Cardiovascular: RRR  Review of Systems: Constitutional: negative for chills   Lab Results  Component Value Date   WBC 9.6 11/18/2015   HGB 9.3* 11/18/2015   HCT 31.4* 11/18/2015   MCV 86.5 11/18/2015   PLT 298 11/18/2015    Lab Results  Component Value Date   CREATININE 1.17 11/18/2015   BUN 15 11/18/2015   NA 138 11/18/2015   K 3.5 11/18/2015   CL 94* 11/18/2015   CO2 32 11/18/2015    Lab Results  Component Value Date   ALT 18 11/10/2015   AST 27 11/10/2015   ALKPHOS 111 11/10/2015     Microbiology: Recent Results (from the past 240 hour(s))  Culture, blood (Routine X 2) w Reflex to ID Panel     Status: None   Collection Time: 11/08/15  5:16 PM  Result Value Ref Range Status   Specimen Description BLOOD LEFT ANTECUBITAL  Final   Special Requests BOTTLES DRAWN AEROBIC ONLY [place]  Final   Culture NO GROWTH 5 DAYS  Final   Report Status 11/13/2015 FINAL  Final  Culture, Urine     Status: None   Collection Time: 11/08/15  6:18 PM  Result Value Ref Range Status   Specimen Description URINE, CATHETERIZED  Final   Special Requests NONE  Final   Culture NO GROWTH  Final   Report Status 11/10/2015 FINAL  Final  Culture, blood (Routine X 2) w Reflex to ID Panel     Status: None   Collection Time: 11/08/15  7:18 PM  Result Value Ref Range Status   Specimen Description BLOOD RIGHT HAND  Final   Special Requests IN PEDIATRIC BOTTLE 2ML  Final   Culture NO GROWTH 5 DAYS  Final   Report Status 11/13/2015 FINAL  Final  Culture, blood (routine x 2)     Status: None   Collection Time: 11/10/15  3:27 PM  Result Value Ref Range Status   Specimen Description BLOOD RIGHT HAND  Final   Special Requests BOTTLES DRAWN AEROBIC ONLY 4CC  Final   Culture NO GROWTH 5 DAYS  Final   Report Status 11/15/2015 FINAL  Final  Culture, blood (routine x 2)     Status: None   Collection Time: 11/10/15  4:06 PM  Result Value Ref Range Status   Specimen Description BLOOD RIGHT HAND  Final   Special Requests IN PEDIATRIC BOTTLE 4CC  Final   Culture NO GROWTH 5 DAYS  Final   Report Status 11/15/2015 FINAL  Final  Culture, Urine     Status: None   Collection Time: 11/10/15  5:20 PM  Result Value Ref Range Status   Specimen Description URINE, CATHETERIZED  Final   Special Requests Rocephin T Cefepime Normal  Final   Culture NO GROWTH  Final   Report Status 11/11/2015 FINAL  Final  Culture, respiratory (NON-Expectorated)     Status: None   Collection Time: 11/11/15  8:50 AM  Result Value Ref Range Status   Specimen Description TRACHEAL ASPIRATE  Final   Special Requests Normal  Final   Gram Stain   Final  ABUNDANT WBC PRESENT,BOTH PMN AND MONONUCLEAR RARE SQUAMOUS EPITHELIAL CELLS PRESENT FEW GRAM POSITIVE COCCI IN PAIRS RARE GRAM POSITIVE RODS RARE GRAM NEGATIVE COCCOBACILLI    Culture   Final    ABUNDANT METHICILLIN RESISTANT STAPHYLOCOCCUS AUREUS   Report Status 11/13/2015 FINAL  Final   Organism ID, Bacteria METHICILLIN RESISTANT STAPHYLOCOCCUS AUREUS  Final      Susceptibility   Methicillin resistant staphylococcus aureus - MIC*    CIPROFLOXACIN >=8 RESISTANT Resistant     ERYTHROMYCIN >=8 RESISTANT Resistant     GENTAMICIN <=0.5 SENSITIVE Sensitive     OXACILLIN >=4 RESISTANT Resistant     TETRACYCLINE <=1 SENSITIVE Sensitive     VANCOMYCIN 1 SENSITIVE Sensitive     TRIMETH/SULFA <=10 SENSITIVE Sensitive     CLINDAMYCIN <=0.25 SENSITIVE Sensitive     RIFAMPIN <=0.5 SENSITIVE Sensitive     Inducible Clindamycin NEGATIVE Sensitive     * ABUNDANT METHICILLIN RESISTANT STAPHYLOCOCCUS AUREUS    Impression/Plan:  1. MRSA  pneumonia - on vancomycin. 2.  MRSA cervical/lumbar osteo.  Day 12/56 of vancomycin.  Surgery once medically improves.   3.  Febrile - afebrile now.   Will follow up next week

## 2015-11-18 NOTE — Progress Notes (Signed)
Speech Language Pathology Treatment: Dysphagia  Patient Details Name: Gregory [name] MRN: [id number] DOB: 10-07-1960 Today's Date: 11/18/2015 Time: [id number] SLP Time Calculation (min) (ACUTE ONLY): 10 min  Assessment / Plan / Recommendation Clinical Impression  Pt consumed regular textures and thin liquids with no overt signs of aspiration. He did have some impulsivity with intake and mild oral residue that was cleared using liquid washes. Recommend to continue with current diet with likely advancement of solids soon.   HPI HPI: 55 yo male from SNF with acute encephalopathy 2nd to septic shock from UTI, AKI, VDRF, and chronic opiate use. Pt was intubated 5/22-5/27, 5/27-5/31. He has hx of MRSA bacteremia with diffuse areas of osteomyelitis in February 2017. Pt had prior swallowing evaluations in 2013 after prolonged intubation and trach with advancement from Dys 3 diet and nectar thick liquids to regular diet and thin liquids prior to discharge.      SLP Plan  Continue with current plan of care     Recommendations  Diet recommendations: Dysphagia 3 (mechanical soft);Thin liquid Liquids provided via: Cup;Straw Medication Administration: Whole meds with liquid Supervision: Patient able to self feed;Full supervision/cueing for compensatory strategies Compensations: Minimize environmental distractions;Slow rate;Small sips/bites Postural Changes and/or Swallow Maneuvers: Seated upright 90 degrees             Oral Care Recommendations: Oral care BID Follow up Recommendations:  (tba (none anticipated)) Plan: Continue with current plan of care     GO               Germain Osgood, M.A. CCC-SLP [phone]  Germain Osgood 11/18/2015, 8:38 AM

## 2015-11-18 NOTE — Progress Notes (Addendum)
PROGRESS NOTE    Gregory [name]  [name] DOB: 21-Mar-1961 DOA: 11/07/2015 PCP: Tamsen Roers, MD   Brief Narrative:  55 yo WM PMHx Diabetes mellitus without complication , HTN, COPD (not on home O2), DDD (degenerative disc disease), lumbar Osteomyelitis, Chronic Pain Syndrome (lower back),Chronic Urinary retention. The following history obtained from Gregory [name]) C-spine osteomyelitis and L-spine abscess times ~4 months. Initially seen at [facility] where surgery was contemplated. Daughter Gregory [name] why surgery not undertaken but decided long-term antibiotics. Patient received 2 months IV antibiotics (completed course 1 May), was then discharged to [facility].  Brought to ER with altered mental status. He resides at [facility]. He was hypoxic, diaphoretic. He was given Narcan and then became agitated. He was given ativan, and became sedated. He was noted to have fever, low BP. He was intubated for airway protection. He had purulent appearing urine. He was started on antibiotics, IV fluids, and pressors.  He was tx for MRSA bacteremia in February 2017 and multifocal osteomyelitis of Lt foot, Lt shoulder, and spine.  @@54  yo male from NH with acute encephalopathy 2nd to septic shock from UTI, AKI, VDRF, and chronic opiate use. He has hx of MRSA bacteremia with diffuse areas of osteomyelitis in February 2017. Admitted with E coli UTI. Patient continuing on vancomycin for osteomyelitis/discitis. Now has MRSA pneumonia/tracheobronchitis. Continuing vancomycin. ID following. Delirium seems to slowly be improving on Seroquel & Haldol IV, able to wean off methadone, precedex minimal. Restarted home chronic pain meds, and home pregabalin and paxil. Much less agitation this AM.  Assessment & Plan:   Active Problems:   Septic shock (HCC)   Altered mental status   Pressure ulcer   Acute respiratory failure with hypoxemia (HCC)   Sepsis (HCC)   UTI (lower urinary tract  infection)   Osteomyelitis (HCC)   Encounter for central line placement   Encounter for orogastric (OG) tube placement   Endotracheally intubated   Osteomyelitis of cervical spine (HCC)   Cervical discitis   Sepsis due to methicillin resistant Staphylococcus aureus (MRSA) (Gregory [name])   CAP (community acquired pneumonia) due to MRSA (methicillin resistant Staphylococcus aureus) ([place])   Escherichia coli urinary tract infection   Chronic diastolic CHF (congestive heart failure) (HCC)   Pulmonary hypertension (HCC)   Essential hypertension   HLD (hyperlipidemia)   Acute renal failure (HCC)   Urinary retention   Uncontrolled type 2 diabetes mellitus with complication (HCC)  0000000 Discitis/positive MRSA Osteomyelitis Cervical spine w/ Trace Epidural Abscess/L4-5 Foraminal Cyst vs abscess -Vancomycin 56 days per ID  -Last seen by Neurosurgery on 5/26. -Air mattress overlay; ensure on rotation at all times -Aspen collar per neurosurgery  Sepsis/positive MRSA Pneumonia/Tracheobronchitis -Completed course antibiotics  E coli UTI -Completed course of antibiotics  Positive MRSA bacteremia  Pain management/Neuropathy/withdrawal from high-dose opioids (oxycodone= 240 mg daily+ Zanaflex) -The following pain medication was abruptly DCed one patient was transferred to SDU;Versed drip 5/31,Fentanyl gtt 6/1, Fentanyl IV injection  5/31, Precedex 6/1, Methadone  5/31,Dilaudid  6/1,Seroquel bid 6/1. Patient was then only restarted on OxyContin 90 mg BID(NOTE patient was on up to 240 mg daily of OxyContin/oxycodone+ Zanaflex at home) -CIWA protocol -OxyContin 90 mg BID -Lyrica 150 mg TID -Haldol 5 mg BID  Acute Hypoxic Respiratory Failure/COPD -Most likely multifactorial to include MRSA pneumonia, COPD exacerbation, and overdose on narcotics, required Narcan in ED.  Chronic diastolic CHF -0000000 echocardiogram showed LVH, pulmonary hypertension, multiple chambers dilated. -Echocardiogram  pending  Chronic diastolic CHF/Pulmonary Hypertension/LVH -Echocardiogram pending -Strict in and out -Daily weight Filed Weights   11/18/15 0500 11/18/15 2316 11/19/15 0500  Weight: 178.7 kg (393 lb 15.4 oz) 97.7 kg (215 lb 6.2 oz) 100.245 kg (221 lb)  -EKG on 6/3 -Transfuse for hemoglobin<8  HTN  -Currently most likely secondary to pain/opioid withdrawal, will hold on BP med until symptoms better controlled  HLD   Acute Renal Failure( Baseline Creatinine 0.98) Lab Results  Component Value Date   CREATININE 1.06 11/19/2015   CREATININE 1.17 11/18/2015   CREATININE 1.06 11/17/2015  . Chronic Urinary Retention -Most likely secondary to L-spine abscess, and bladder damage secondary urinary retention. Has seen urologist per daughter who has confirmed bladder damage  Anemia - No signs of active bleeding. S -5/24 transfuse 1u PRBC   Recent Labs Lab 11/14/15 0351 11/15/15 0430 11/16/15 0400 11/18/15 0358 11/19/15 0406  HGB 7.4* 7.7* 8.3* 9.3* 9.7*    DM type II uncontrolled with complications -[id number] hemoglobin A1c= 7.2 -Increase Lantus 15 units daily -Resistant SSI -Hold outpt amaryl, lantus, & victoza     DVT prophylaxis: Subcutaneous heparin Code Status: Full Family Communication: [name] [name]  Shiv[place] ([place] [name]) Disposition Plan: ??   Consultants:  Dr.Robert W Comer infectious disease Dr Consuella Lose. Neurosurgery  Procedures/Significant Events:  5/22 CT head: mild small vessel ischemic changes 5/24 Port CXR: ETT & L IJ CVL in good position. No new focal opacity. 5/24 Port Renal U/S: No mass or hydronephrosis. 5/25 CT Abd/Pelvis: Splenomegaly noted. Inflammatory changes adjacent bladder. 5/26 MRI Spine: C5-6 discitis osteomyelitis w/ trace epidural abscess. L4-5 facet cyst vs abscess 5/26 R Foot X-ray: Soft tissue ulcer at tip of the first distal phalanx. No evidence of osteomyelitis and right foot.   Cultures 5/22 Blood: Negative  5/22  Urine:Positive E coli 5/23 Blood: Negative  5/23 Urine: Negative 5/25 Blood >> NGTD 5/25 Urine: Negative 5/26 Trach Asp: Positive MRSA  Antimicrobials: Zosyn 5/22>>>off Cefepime 5/22 - 5/25 Rocephin 5/25 - 5/26 Vancomycin 5/22 - 5/24; 5/26>>   Devices    LINES / TUBES:  OETT 7.5 5/22 - 5/27; 5/27 >> L IJ CVL 5/22 >>fever 6/1 OGT 5/22 >> Foley 5/22 >> PIV x2    Continuous Infusions: . lactated ringers 20 mL/hr at 11/19/15 0005     Subjective: 6/2  A/O 4, patient insists his pain is 11/10, positive peripheral neuropathy and all appendages. Restless, yelling out for more pain medication (clearly in withdrawal)   Objective: Filed Vitals:   11/19/15 0041 11/19/15 0404 11/19/15 0500 11/19/15 0730  BP: 113/67 142/83  160/81  Pulse: 92 100  92  Temp: 99.7 F (37.6 C) 98.1 F (36.7 C)  98.1 F (36.7 C)  TempSrc: Axillary Oral  Oral  Resp: 20 17  15   Height:      Weight:   100.245 kg (221 lb)   SpO2: 95% 92%  94%    Intake/Output Summary (Last 24 hours) at 11/19/15 0901 Last data filed at 11/19/15 [id number]  Gross per 24 hour  Intake 2178.33 ml  Output   4325 ml  Net -2146.67 ml   Filed Weights   11/18/15 0500 11/18/15 2316 11/19/15 0500  Weight: 178.7 kg (393 lb 15.4 oz) 97.7 kg (215 lb 6.2 oz) 100.245 kg (221 lb)    Examination:  General:  A/O 4, patient insists his pain is 11/10,, No acute respiratory distress Eyes: negative scleral hemorrhage, negative anisocoria, negative icterus ENT: Negative Runny nose, negative gingival bleeding, Neck:  Negative scars, masses, torticollis, lymphadenopathy, JVD Lungs: Clear to auscultation bilaterally without wheezes or crackles Cardiovascular: Tachycardic, Regular rhythm without murmur gallop or rub normal S1 and S2 Abdomen: negative abdominal pain, nondistended, positive soft, bowel sounds, no rebound, no ascites, no appreciable mass Extremities: all extremities resting and intention tremor, right foot drop    Skin: Rosacea?  Psychiatric:  Negative depression, negative anxiety, negative fatigue, positive mania, in the midst of opioid withdrawal  Central nervous system:  Cranial nerves II through XII intact, negative dysarthria, negative expressive aphasia, negative receptive aphasia.  .     Data Reviewed: Care during the described time interval was provided by me .  I have reviewed this patient's available data, including medical history, events of note, physical examination, and all test results as part of my evaluation. I have personally reviewed and interpreted all radiology studies.  CBC:  Recent Labs Lab 11/14/15 0351 11/15/15 0430 11/16/15 0400 11/18/15 0358 11/19/15 0406  WBC 5.9 5.6 7.3 9.6 9.1  NEUTROABS 3.2 3.3 4.7 6.2 5.9  HGB 7.4* 7.7* 8.3* 9.3* 9.7*  HCT 25.3* 26.2* 28.5* 31.4* 32.4*  MCV 88.2 88.2 88.0 86.5 85.9  PLT 286 284 359 298 0000000   Basic Metabolic Panel:  Recent Labs Lab 11/15/15 0430 11/16/15 0400 11/17/15 0419 11/18/15 0358 11/19/15 0406  NA 141 140 139 138 138  K 3.3* 3.3* 3.8 3.5 3.4*  CL 104 99* 96* 94* 94*  CO2 30 34* 32 32 32  GLUCOSE 143* 142* 113* 117* 101*  BUN 10 11 12 15 11   CREATININE 0.69 0.76 1.06 1.17 1.06  CALCIUM 9.1 9.3 9.8 9.5 9.6  MG 1.8 1.6* 1.6* 2.0 1.9  PHOS 4.6 5.2* 4.7* 5.3* 4.8*   GFR: Estimated Creatinine Clearance: 96.9 mL/min (by C-G formula based on Cr of 1.06). Liver Function Tests:  Recent Labs Lab 11/15/15 0430 11/16/15 0400 11/17/15 0419 11/18/15 0358 11/19/15 0406  AST  --   --   --   --  17  ALT  --   --   --   --  18  ALKPHOS  --   --   --   --  79  BILITOT  --   --   --   --  0.3  PROT  --   --   --   --  7.7  ALBUMIN 2.0* 2.2* 2.5* 2.5* 2.6*   No results for input(s): LIPASE, AMYLASE in the last 168 hours. No results for input(s): AMMONIA in the last 168 hours. Coagulation Profile:  Recent Labs Lab 11/15/15 1315  INR 1.10   Cardiac Enzymes: No results for input(s): CKTOTAL, CKMB,  CKMBINDEX, TROPONINI in the last 168 hours. BNP (last 3 results) No results for input(s): PROBNP in the last 8760 hours. HbA1C: No results for input(s): HGBA1C in the last 72 hours. CBG:  Recent Labs Lab 11/18/15 1651 11/18/15 2154 11/18/15 2333 11/19/15 0407 11/19/15 0802  GLUCAP 164* 227* 215* 108* 166*   Lipid Profile:  Recent Labs  11/18/15 0358 11/19/15 0406  TRIG 205* 202*   Thyroid Function Tests: No results for input(s): TSH, T4TOTAL, FREET4, T3FREE, THYROIDAB in the last 72 hours. Anemia Panel: No results for input(s): VITAMINB12, FOLATE, FERRITIN, TIBC, IRON, RETICCTPCT in the last 72 hours. Urine analysis:    Component Value Date/Time   COLORURINE YELLOW 11/10/2015 1720   APPEARANCEUR HAZY* 11/10/2015 1720   LABSPEC 1.014 11/10/2015 1720   PHURINE 6.0 11/10/2015 1720   GLUCOSEU NEGATIVE 11/10/2015 1720  HGBUR SMALL* 11/10/2015 1720   BILIRUBINUR NEGATIVE 11/10/2015 1720   KETONESUR 15* 11/10/2015 1720   PROTEINUR 100* 11/10/2015 1720   UROBILINOGEN 1.0 02/03/2014 0052   NITRITE NEGATIVE 11/10/2015 1720   LEUKOCYTESUR SMALL* 11/10/2015 1720   Sepsis Labs: @LABRCNTIP (procalcitonin:4,lacticidven:4)  ) Recent Results (from the past 240 hour(s))  Culture, blood (routine x 2)     Status: None   Collection Time: 11/10/15  3:27 PM  Result Value Ref Range Status   Specimen Description BLOOD RIGHT HAND  Final   Special Requests BOTTLES DRAWN AEROBIC ONLY 4CC  Final   Culture NO GROWTH 5 DAYS  Final   Report Status 11/15/2015 FINAL  Final  Culture, blood (routine x 2)     Status: None   Collection Time: 11/10/15  4:06 PM  Result Value Ref Range Status   Specimen Description BLOOD RIGHT HAND  Final   Special Requests IN PEDIATRIC BOTTLE 4CC  Final   Culture NO GROWTH 5 DAYS  Final   Report Status 11/15/2015 FINAL  Final  Culture, Urine     Status: None   Collection Time: 11/10/15  5:20 PM  Result Value Ref Range Status   Specimen Description URINE,  CATHETERIZED  Final   Special Requests Rocephin T Cefepime Normal  Final   Culture NO GROWTH  Final   Report Status 11/11/2015 FINAL  Final  Culture, respiratory (NON-Expectorated)     Status: None   Collection Time: 11/11/15  8:50 AM  Result Value Ref Range Status   Specimen Description TRACHEAL ASPIRATE  Final   Special Requests Normal  Final   Gram Stain   Final    ABUNDANT WBC PRESENT,BOTH PMN AND MONONUCLEAR RARE SQUAMOUS EPITHELIAL CELLS PRESENT FEW GRAM POSITIVE COCCI IN PAIRS RARE GRAM POSITIVE RODS RARE GRAM NEGATIVE COCCOBACILLI    Culture   Final    ABUNDANT METHICILLIN RESISTANT STAPHYLOCOCCUS AUREUS   Report Status 11/13/2015 FINAL  Final   Organism ID, Bacteria METHICILLIN RESISTANT STAPHYLOCOCCUS AUREUS  Final      Susceptibility   Methicillin resistant staphylococcus aureus - MIC*    CIPROFLOXACIN >=8 RESISTANT Resistant     ERYTHROMYCIN >=8 RESISTANT Resistant     GENTAMICIN <=0.5 SENSITIVE Sensitive     OXACILLIN >=4 RESISTANT Resistant     TETRACYCLINE <=1 SENSITIVE Sensitive     VANCOMYCIN 1 SENSITIVE Sensitive     TRIMETH/SULFA <=10 SENSITIVE Sensitive     CLINDAMYCIN <=0.25 SENSITIVE Sensitive     RIFAMPIN <=0.5 SENSITIVE Sensitive     Inducible Clindamycin NEGATIVE Sensitive     * ABUNDANT METHICILLIN RESISTANT STAPHYLOCOCCUS AUREUS         Radiology Studies: No results found.      Scheduled Meds: . collagenase   Topical Daily  . feeding supplement (ENSURE ENLIVE)  237 mL Oral BID BM  . furosemide  20 mg Intravenous Daily  . haloperidol lactate  5 mg Intravenous BID  . heparin subcutaneous  5,000 Units Subcutaneous Q8H  . insulin aspart  0-20 Units Subcutaneous Q4H  . insulin glargine  15 Units Subcutaneous QHS  . oxyCODONE  90 mg Oral Q12H  . pantoprazole  40 mg Oral Daily  . PARoxetine  40 mg Per Tube Daily  . pregabalin  150 mg Oral Q8H  . senna  1 tablet Oral BID  . tamsulosin  0.4 mg Oral Daily  . vancomycin  750 mg Intravenous  Q12H   Continuous Infusions: . lactated ringers 20 mL/hr at 11/19/15 0005  LOS: 12 days    Time spent: 40 minutes    WOODS, Geraldo Docker, MD [facility] Pager [phone]   If 7PM-7AM, please contact night-coverage [email or web address] Password [email or web address] 11/19/2015, 9:01 AM

## 2015-11-18 NOTE — Progress Notes (Signed)
Nutrition Follow-up  DOCUMENTATION CODES:   Obesity unspecified  INTERVENTION:    Ensure Enlive po BID, each supplement provides 350 kcal and 20 grams of protein  NUTRITION DIAGNOSIS:   Inadequate oral intake related to dysphagia, poor appetite as evidenced by meal completion < 50%.  Ongoing  GOAL:   Patient will meet greater than or equal to 90% of their needs  Progressing  MONITOR:   PO intake, Supplement acceptance, Labs, Weight trends, Skin, I & O's  REASON FOR ASSESSMENT:   Consult Enteral/tube feeding initiation and management  ASSESSMENT:   55 yo male from NH with acute encephalopathy 2nd to septic shock from UTI, AKI, VDRF, and chronic opiate use. He has hx of MRSA bacteremia with diffuse areas of osteomyelitis in February 2017.  Patient was intubated 5/27-5/31.  SLP following for diet advancement / tolerance. Currently on dysphagia 3 with thin liquids. Eating okay, consuming < 50% of meals. Question accuracy of today's weight reading, admit weight 104.5 kg, today's weight 178.7 kg.  Diet Order:  DIET DYS 3 Room service appropriate?: Yes; Fluid consistency:: Thin  Skin:  Wound (see comment) (stg II R arm; stg I buttocks; wound to R foot/toe)  Last BM:  6/2  Height:   Ht Readings from Last 1 Encounters:  11/12/15 6' (1.829 m)    Weight:   Wt Readings from Last 1 Encounters:  11/18/15 393 lb 15.4 oz (178.7 kg)   11/11/15 222 lb 11.2 oz (101.016 kg)  Admission weight: 230 lb 6.1 oz (104.5 kg)     Ideal Body Weight:  80.9 kg  BMI:  Body mass index is 53.42 kg/(m^2).  Estimated Nutritional Needs:   Kcal:  2000-2200  Protein:  120-140 gm  Fluid:  2-2.2 L  EDUCATION NEEDS:   No education needs identified at this time  Molli Barrows, [place], [place], [place] Pager [phone] After Hours Pager [phone]

## 2015-11-18 NOTE — Evaluation (Signed)
Physical Therapy Evaluation Patient Details Name: Gregory [name] MRN: [id number] DOB: 01/23/1961 Today's Date: 11/18/2015   History of Present Illness  55 yo male from SNF with acute encephalopathy 2nd to septic shock from UTI, AKI, VDRF, and chronic opiate use. Pt wiith MRSA PNA and C5-6 Discitis/Osteomyelitis w/ Trace Epidural Abscess/L4-5 Foraminal Cyst vs abscess. PMH - DM, HTN, osteomyelitis  Clinical Impression  Pt admitted with above diagnosis and presents to PT with functional limitations due to deficits listed below (See PT problem list). Pt needs skilled PT to maximize independence and safety to allow discharge to back to SNF. Pt motivated to continue to work with PT.    Follow Up Recommendations SNF    Equipment Recommendations  Other (comment) (Defer to SNF)    Recommendations for Other Services       Precautions / Restrictions Precautions Precautions: Cervical;Fall Restrictions Weight Bearing Restrictions: No      Mobility  Bed Mobility Overal bed mobility: Needs Assistance Bed Mobility: Sit to Sidelying;Sidelying to Sit;Rolling Rolling: Max assist Sidelying to sit: +2 for physical assistance;Max assist     Sit to sidelying: +2 for physical assistance;Min assist General bed mobility comments: Assist to bring legs off bed, elevate trunk into sitting, and to scoot hips to EOB. Assist to lower trunk and bring feet back up into bed.  Transfers                    Ambulation/Gait                Stairs            Wheelchair Mobility    Modified Rankin (Stroke Patients Only)       Balance Overall balance assessment: Needs assistance Sitting-balance support: Bilateral upper extremity supported;Feet supported;No upper extremity supported Sitting balance-Leahy Scale: Poor Sitting balance - Comments: Sat EOB x 12 minutes with min to min guard assist. Postural control: Posterior lean                                   Pertinent  Vitals/Pain Pain Assessment: Faces Faces Pain Scale: Hurts even more Pain Location: back Pain Descriptors / Indicators: Grimacing;Guarding Pain Intervention(s): Limited activity within patient's tolerance;Monitored during session    Home Living Family/patient expects to be discharged to:: Skilled nursing facility                      Prior Function Level of Independence: Needs assistance   Gait / Transfers Assistance Needed: Per pt has been transferring to w/c with very little if any assist but not ambulating     Comments: Pt was at [facility] for 1 month and 6 weeks at SNF per pt.     Hand Dominance        Extremity/Trunk Assessment   Upper Extremity Assessment: Generalized weakness           Lower Extremity Assessment: RLE deficits/detail;LLE deficits/detail RLE Deficits / Details: grossly 2+/5  and ankle 1/5 LLE Deficits / Details: grossly 2+/5  and ankle 1/5     Communication      Cognition Arousal/Alertness: Awake/alert Behavior During Therapy: WFL for tasks assessed/performed Overall Cognitive Status: No family/caregiver present to determine baseline cognitive functioning Area of Impairment: Orientation;Attention;Memory;Following commands;Safety/judgement;Problem solving Orientation Level: Disoriented to;Time Current Attention Level: Sustained Memory: Decreased short-term memory Following Commands: Follows one step commands consistently Safety/Judgement: Decreased awareness of safety;Decreased awareness  of deficits   Problem Solving: Slow processing;Requires verbal cues;Requires tactile cues General Comments: Thought he saw a big tub in room    General Comments      Exercises        Assessment/Plan    PT Assessment Patient needs continued PT services  PT Diagnosis Difficulty walking;Generalized weakness   PT Problem List Decreased strength;Decreased balance;Decreased mobility;Decreased cognition;Decreased safety awareness;Decreased  knowledge of precautions  PT Treatment Interventions DME instruction;Functional mobility training;Therapeutic activities;Therapeutic exercise;Balance training;Neuromuscular re-education;Cognitive remediation;Patient/family education   PT Goals (Current goals can be found in the Care Plan section) Acute Rehab PT Goals Patient Stated Goal: be able to walk PT Goal Formulation: With patient Time For Goal Achievement: 12/02/15 Potential to Achieve Goals: Good    Frequency Min 2X/week   Barriers to discharge        Co-evaluation               End of Session Equipment Utilized During Treatment: Gait belt Activity Tolerance: Patient tolerated treatment well Patient left: in bed;with call bell/phone within reach;with bed alarm set;with nursing/sitter in room Nurse Communication: Other (comment) (sitter present)         Time: 1540-1601 PT Time Calculation (min) (ACUTE ONLY): 21 min   Charges:   PT Evaluation $PT Eval Moderate Complexity: 1 Procedure     PT G Codes:        Kharter Brew 11-27-2015, 5:04 PM [facility] [phone]

## 2015-11-19 DIAGNOSIS — J9601 Acute respiratory failure with hypoxia: Secondary | ICD-10-CM | POA: Diagnosis present

## 2015-11-19 DIAGNOSIS — I272 Pulmonary hypertension, unspecified: Secondary | ICD-10-CM | POA: Diagnosis present

## 2015-11-19 DIAGNOSIS — E1165 Type 2 diabetes mellitus with hyperglycemia: Secondary | ICD-10-CM | POA: Diagnosis present

## 2015-11-19 DIAGNOSIS — R339 Retention of urine, unspecified: Secondary | ICD-10-CM | POA: Diagnosis present

## 2015-11-19 DIAGNOSIS — M21372 Foot drop, left foot: Secondary | ICD-10-CM

## 2015-11-19 DIAGNOSIS — I5032 Chronic diastolic (congestive) heart failure: Secondary | ICD-10-CM | POA: Diagnosis present

## 2015-11-19 DIAGNOSIS — N390 Urinary tract infection, site not specified: Secondary | ICD-10-CM

## 2015-11-19 DIAGNOSIS — B9620 Unspecified Escherichia coli [E. coli] as the cause of diseases classified elsewhere: Secondary | ICD-10-CM | POA: Diagnosis present

## 2015-11-19 DIAGNOSIS — F1193 Opioid use, unspecified with withdrawal: Secondary | ICD-10-CM

## 2015-11-19 DIAGNOSIS — M4642 Discitis, unspecified, cervical region: Secondary | ICD-10-CM | POA: Diagnosis present

## 2015-11-19 DIAGNOSIS — R29898 Other symptoms and signs involving the musculoskeletal system: Secondary | ICD-10-CM

## 2015-11-19 DIAGNOSIS — E785 Hyperlipidemia, unspecified: Secondary | ICD-10-CM | POA: Diagnosis present

## 2015-11-19 DIAGNOSIS — J15212 Pneumonia due to Methicillin resistant Staphylococcus aureus: Secondary | ICD-10-CM | POA: Diagnosis present

## 2015-11-19 DIAGNOSIS — A4102 Sepsis due to Methicillin resistant Staphylococcus aureus: Secondary | ICD-10-CM | POA: Diagnosis present

## 2015-11-19 DIAGNOSIS — M21371 Foot drop, right foot: Secondary | ICD-10-CM | POA: Diagnosis present

## 2015-11-19 DIAGNOSIS — E118 Type 2 diabetes mellitus with unspecified complications: Secondary | ICD-10-CM

## 2015-11-19 DIAGNOSIS — M7138 Other bursal cyst, other site: Secondary | ICD-10-CM | POA: Diagnosis present

## 2015-11-19 DIAGNOSIS — N179 Acute kidney failure, unspecified: Secondary | ICD-10-CM | POA: Diagnosis present

## 2015-11-19 DIAGNOSIS — M4640 Discitis, unspecified, site unspecified: Secondary | ICD-10-CM | POA: Diagnosis present

## 2015-11-19 DIAGNOSIS — IMO0002 Reserved for concepts with insufficient information to code with codable children: Secondary | ICD-10-CM | POA: Diagnosis present

## 2015-11-19 DIAGNOSIS — I10 Essential (primary) hypertension: Secondary | ICD-10-CM | POA: Diagnosis present

## 2015-11-19 DIAGNOSIS — M7130 Other bursal cyst, unspecified site: Secondary | ICD-10-CM

## 2015-11-19 DIAGNOSIS — M4622 Osteomyelitis of vertebra, cervical region: Secondary | ICD-10-CM | POA: Diagnosis present

## 2015-11-19 LAB — COMPREHENSIVE METABOLIC PANEL
ALK PHOS: 79 U/L (ref 38–126)
ALT: 18 U/L (ref 17–63)
AST: 17 U/L (ref 15–41)
Albumin: 2.6 g/dL — ABNORMAL LOW (ref 3.5–5.0)
Anion gap: 12 (ref 5–15)
BILIRUBIN TOTAL: 0.3 mg/dL (ref 0.3–1.2)
BUN: 11 mg/dL (ref 6–20)
CALCIUM: 9.6 mg/dL (ref 8.9–10.3)
CO2: 32 mmol/L (ref 22–32)
CREATININE: 1.06 mg/dL (ref 0.61–1.24)
Chloride: 94 mmol/L — ABNORMAL LOW (ref 101–111)
GFR calc Af Amer: >60 mL/min (ref >60)
GFR calc non Af Amer: >60 mL/min (ref >60)
GLUCOSE: 101 mg/dL — AB (ref 65–99)
Potassium: 3.4 mmol/L — ABNORMAL LOW (ref 3.5–5.1)
Sodium: 138 mmol/L (ref 135–145)
Total Protein: 7.7 g/dL (ref 6.5–8.1)

## 2015-11-19 LAB — GLUCOSE, CAPILLARY
GLUCOSE-CAPILLARY: 166 mg/dL — AB (ref 65–99)
GLUCOSE-CAPILLARY: 298 mg/dL — AB (ref 65–99)
GLUCOSE-CAPILLARY: 137 mg/dL — AB (ref 65–99)
Glucose-Capillary: 108 mg/dL — ABNORMAL HIGH (ref 65–99)
Glucose-Capillary: 218 mg/dL — ABNORMAL HIGH (ref 65–99)

## 2015-11-19 LAB — CBC WITH DIFFERENTIAL/PLATELET
BASOS ABS: 0 10*3/uL (ref 0.0–0.1)
Basophils Relative: 0 %
Eosinophils Absolute: 0.3 10*3/uL (ref 0.0–0.7)
Eosinophils Relative: 3 %
HEMATOCRIT: 32.4 % — AB (ref 39.0–52.0)
HEMOGLOBIN: 9.7 g/dL — AB (ref 13.0–17.0)
Lymphocytes Relative: 23 %
Lymphs Abs: 2.1 10*3/uL (ref 0.7–4.0)
MCH: 25.7 pg — ABNORMAL LOW (ref 26.0–34.0)
MCHC: 29.9 g/dL — ABNORMAL LOW (ref 30.0–36.0)
MCV: 85.9 fL (ref 78.0–100.0)
MONOS PCT: 9 %
Monocytes Absolute: 0.8 10*3/uL (ref 0.1–1.0)
NEUTROS ABS: 5.9 10*3/uL (ref 1.7–7.7)
NEUTROS PCT: 65 %
Platelets: 324 10*3/uL (ref 150–400)
RBC: 3.77 MIL/uL — AB (ref 4.22–5.81)
RDW: 16.6 % — ABNORMAL HIGH (ref 11.5–15.5)
WBC: 9.1 10*3/uL (ref 4.0–10.5)

## 2015-11-19 LAB — TRIGLYCERIDES: Triglycerides: 202 mg/dL — ABNORMAL HIGH (ref <150)

## 2015-11-19 LAB — PHOSPHORUS: Phosphorus: 4.8 mg/dL — ABNORMAL HIGH (ref 2.5–4.6)

## 2015-11-19 LAB — MAGNESIUM: Magnesium: 1.9 mg/dL (ref 1.7–2.4)

## 2015-11-19 MED ORDER — OXYCODONE HCL ER 40 MG PO T12A
90.0000 mg | EXTENDED_RELEASE_TABLET | Freq: Two times a day (BID) | ORAL | Status: DC
  Administered 2015-11-19 – 2015-11-22 (×7): 90 mg via ORAL
  Filled 2015-11-19 (×6): qty 1

## 2015-11-19 MED ORDER — POTASSIUM CHLORIDE CRYS ER 20 MEQ PO TBCR
40.0000 meq | EXTENDED_RELEASE_TABLET | Freq: Once | ORAL | Status: AC
  Administered 2015-11-19: 40 meq via ORAL
  Filled 2015-11-19: qty 2

## 2015-11-19 NOTE — Progress Notes (Signed)
RN spoke with MD.  Pt notified of need to wait on repeat BC to be negative prior to placing a PICC line and need for PIV.  Pt agreeable to having PIV started.  [facility] ultrasound used to obtain 20 G 1.88 inch PIV.

## 2015-11-19 NOTE — Progress Notes (Signed)
Pt refusing to allow VAS Team to assess for PIV.  States wants a PICC line.  Jarrett Soho RN to call MD to notify of request.

## 2015-11-19 NOTE — Progress Notes (Signed)
PROGRESS NOTE    Gerado Companion  [id number] DOB: July 15, 1960 DOA: 11/07/2015 PCP: Tamsen Roers, MD   Brief Narrative:  55 yo WM PMHx Diabetes mellitus without complication , HTN, COPD (not on home O2), DDD (degenerative disc disease), lumbar Osteomyelitis, Chronic Pain Syndrome (lower back),Chronic Urinary retention. The following history obtained from [place] RN) C-spine osteomyelitis and L-spine abscess times ~4 months. Initially seen at [facility] where surgery was contemplated. Daughter [name] why surgery not undertaken but decided long-term antibiotics. Patient received 2 months IV antibiotics (completed course 1 May), was then discharged to [facility].  Brought to ER with altered mental status. He resides at [facility]. He was hypoxic, diaphoretic. He was given Narcan and then became agitated. He was given ativan, and became sedated. He was noted to have fever, low BP. He was intubated for airway protection. He had purulent appearing urine. He was started on antibiotics, IV fluids, and pressors.  He was tx for MRSA bacteremia in February 2017 and multifocal osteomyelitis of Lt foot, Lt shoulder, and spine.  @@55  yo male from NH with acute encephalopathy 2nd to septic shock from UTI, AKI, VDRF, and chronic opiate use. He has hx of MRSA bacteremia with diffuse areas of osteomyelitis in February 2017. Admitted with E coli UTI. Patient continuing on vancomycin for osteomyelitis/discitis. Now has MRSA pneumonia/tracheobronchitis. Continuing vancomycin. ID following. Delirium seems to slowly be improving on Seroquel & Haldol IV, able to wean off methadone, precedex minimal. Restarted home chronic pain meds, and home pregabalin and paxil. Much less agitation this AM.  Assessment & Plan:   Active Problems:   Septic shock (HCC)   Altered mental status   Pressure ulcer   Acute respiratory failure with hypoxemia (HCC)   Sepsis (HCC)   UTI (lower urinary tract  infection)   Osteomyelitis (HCC)   Encounter for central line placement   Encounter for orogastric (OG) tube placement   Endotracheally intubated   Osteomyelitis of cervical spine (HCC)   Cervical discitis   Sepsis due to methicillin resistant Staphylococcus aureus (MRSA) ([place])   CAP (community acquired pneumonia) due to MRSA (methicillin resistant Staphylococcus aureus) ([place])   Escherichia coli urinary tract infection   Chronic diastolic CHF (congestive heart failure) (HCC)   Pulmonary hypertension (HCC)   Essential hypertension   HLD (hyperlipidemia)   Acute renal failure (HCC)   Urinary retention   Uncontrolled type 2 diabetes mellitus with complication (HCC)   Discitis of cervical region   Synovial cyst of lumbar spine   Weakness of both lower extremities   Foot drop, bilateral   Pneumonia of both lower lobes due to methicillin resistant Staphylococcus aureus (MRSA) (HCC)   Opioid use with withdrawal (HCC)   Acute respiratory failure with hypoxia (HCC)  C5-6 Discitis/positive MRSA Osteomyelitis Cervical spine w/ Trace Epidural Abscess/L4-5 Foraminal Cyst vs abscess -Vancomycin 57 days per ID  -Last seen by Neurosurgery on 5/26. -Air mattress overlay; ensure on rotation at all times -Aspen collar per neurosurgery -Spoke with Dr. Kristeen Miss Neurosurgery; concerned that patient was able to ambulate in January and now has bilateral lower extremity weakness with footdrop. Given patient's pathology in the L-spine and his other core morbidities would surgery be option to improve his lower extremity weakness? Dr. Kristeen Miss to review MRI scans and give recommendations. Recommended that I hold off on starting dexamethasone.  Bilateral lower extremity weakness/bilateral foot drop -L-spine cyst/abscess with severe canal stenosis and disc Bulge L4-5 -Will hold  on starting dexamethasone until neurosurgery recommendations  Sepsis/positive MRSA Pneumonia/Tracheobronchitis -Completed  course antibiotics  E coli UTI -Completed course of antibiotics  Positive MRSA bacteremia -Completed course of antibiotics -6/3 Blood culture pending. If negative will have PICC line placed  Pain management/Neuropathy/withdrawal from high-dose opioids (oxycodone= 240 mg daily+ Zanaflex) -The following pain medication was abruptly DCed one patient was transferred to SDU;Versed drip 5/31,Fentanyl gtt 6/1, Fentanyl IV injection  5/31, Precedex 6/1, Methadone  5/31,Dilaudid  6/1,Seroquel bid 6/1. Patient was then only restarted on OxyContin 90 mg BID(NOTE patient was on up to 240 mg daily of OxyContin/oxycodone+ Zanaflex at home) -CIWA protocol -OxyContin 90 mg BID -Lyrica 150 mg TID -Haldol 5 mg BID  Acute Hypoxic Respiratory Failure/COPD -Most likely multifactorial to include MRSA pneumonia, COPD exacerbation, and overdose on narcotics, required Narcan in ED.  Chronic diastolic CHF -0000000 echocardiogram showed LVH, pulmonary hypertension, multiple chambers dilated. -Echocardiogram pending  Chronic diastolic CHF/Pulmonary Hypertension/LVH -Echocardiogram pending -Strict in and out -Daily weight Filed Weights   11/18/15 0500 11/18/15 2316 11/19/15 0500  Weight: 178.7 kg (393 lb 15.4 oz) 97.7 kg (215 lb 6.2 oz) 100.245 kg (221 lb)  -EKG on 6/3 -Transfuse for hemoglobin<8  Recent Labs Lab 11/14/15 0351 11/15/15 0430 11/16/15 0400 11/18/15 0358 11/19/15 0406  HGB 7.4* 7.7* 8.3* 9.3* 9.7*    HTN  -Much better controlled after obtaining adequate pain control.  HLD   Acute Renal Failure( Baseline Creatinine 0.98) Lab Results  Component Value Date   CREATININE 1.06 11/19/2015   CREATININE 1.17 11/18/2015   CREATININE 1.06 11/17/2015  . Chronic Urinary Retention -Most likely secondary to L-spine abscess, and bladder damage secondary urinary retention. Has seen urologist per daughter who has confirmed bladder damage  Anemia - No signs of active bleeding. S -5/24  transfuse 1u PRBC   Recent Labs Lab 11/14/15 0351 11/15/15 0430 11/16/15 0400 11/18/15 0358 11/19/15 0406  HGB 7.4* 7.7* 8.3* 9.3* 9.7*    DM type II uncontrolled with complications -[id number] hemoglobin A1c= 7.2 -Increase Lantus 15 units daily -Resistant SSI -Hold outpt amaryl, lantus, & victoza     DVT prophylaxis: Subcutaneous heparin Code Status: Full Family Communication: RN [name]  Ruddell ([place] RN) Disposition Plan: ??   Consultants:  Dr.Robert W Comer infectious disease Dr Consuella Lose. Neurosurgery  Procedures/Significant Events:  5/22 CT head: mild small vessel ischemic changes 5/24 Port CXR: ETT & L IJ CVL in good position. No new focal opacity. 5/24 Port Renal U/S: No mass or hydronephrosis. 5/25 CT Abd/Pelvis: Splenomegaly noted. Inflammatory changes adjacent bladder. 5/26 MRI Spine: C5-6 discitis osteomyelitis w/ trace epidural abscess.  - Grade 1 L4-5 anterolisthesis-- bright STIR signal and enhancingbone marrow signal within the RIGHT L4-5 facet. -Congenital canal narrowing moderate L4-5 and mild L5-S1 disc height loss-- mild desiccation.-- Mild acute on chronic discogenic endplate changes at 075-GRM. -Central displacement of the cauda equina due to canal stenosis.  -L3-4: Large 5 mm broad-based disc bulge. -Moderate facet arthropathy and ligamentum flavum redundancy. -Severe canal stenosis. -Moderate to severe bilateral neural foraminal narrowing. -L4-5: Anterolisthesis. Moderate broad-based disc bulge. Severe facet arthropathy and ligamentum flavum redundancy -Severe canal stenosis. Severe RIGHT greater than LEFT neural foraminal narrowing. Abnormal signal within the RIGHT L4-5 lateral recess extend toward the RIGHT L5-S1 neural foramen, contiguous with the RIGHT facet with superimposed enhancement.  -6 mm RIGHT facet synovial cyst, less likely abscess.  -L5-S1: Moderate RIGHT, mild LEFT neural foraminal narrowing. 5/26 R Foot X-ray: Soft tissue  ulcer at tip  of the first distal phalanx. No evidence of osteomyelitis and right foot.   Cultures 5/22 Blood: Negative  5/22 Urine:Positive E coli 5/23 Blood: Negative  5/23 Urine: Negative 5/25 Blood >> NGTD 5/25 Urine: Negative 5/26 Trach Asp: Positive MRSA 6/3 blood pending 6/3 urine pending   Antimicrobials: Zosyn 5/22>>>off Cefepime 5/22 - 5/25 Rocephin 5/25 - 5/26 Vancomycin 5/22 - 5/24; 5/26>>   Devices    LINES / TUBES:  OETT 7.5 5/22 - 5/27; 5/27 >> L IJ CVL 5/22 >>fever 6/1 OGT 5/22 >> DCed? Foley 5/22 >>     Continuous Infusions: . lactated ringers 20 mL/hr at 11/19/15 0005     Subjective: 6/3  A/O 4, Patient resting comfortably in bed. States pain much better controlled on his new regimen.     Objective: Filed Vitals:   11/19/15 0404 11/19/15 0500 11/19/15 0730 11/19/15 1115  BP: 142/83  160/81 128/75  Pulse: 100  92 101  Temp: 98.1 F (36.7 C)  98.1 F (36.7 C) 98.5 F (36.9 C)  TempSrc: Oral  Oral Oral  Resp: 17  15 17   Height:      Weight:  100.245 kg (221 lb)    SpO2: 92%  94% 93%    Intake/Output Summary (Last 24 hours) at 11/19/15 1335 Last data filed at 11/19/15 1117  Gross per 24 hour  Intake 1818.33 ml  Output   4225 ml  Net -2406.67 ml   Filed Weights   11/18/15 0500 11/18/15 2316 11/19/15 0500  Weight: 178.7 kg (393 lb 15.4 oz) 97.7 kg (215 lb 6.2 oz) 100.245 kg (221 lb)    Examination:  General:  A/O 4, Pain is controlled,, No acute respiratory distress Eyes: negative scleral hemorrhage, negative anisocoria, negative icterus ENT: Negative Runny nose, negative gingival bleeding, Neck:  Negative scars, masses, torticollis, lymphadenopathy, JVD Lungs: Clear to auscultation bilaterally without wheezes or crackles Cardiovascular: Tachycardic, Regular rhythm without murmur gallop or rub normal S1 and S2 Abdomen: negative abdominal pain, nondistended, positive soft, bowel sounds, no rebound, no ascites, no  appreciable mass Extremities: patient has bilateral foot drop Rt>>Lt, ulcer on the right first metatarsal partially healed negative discharge   Skin: Rosacea?  Psychiatric:  Negative depression, negative anxiety, negative fatigue, positive mania, in the midst of opioid withdrawal  Neurologic:  Cranial nerves II through XII intact, tongue/uvula midline, all extremities muscle strength 5/5,Bilateral foot drop Rt>>Lt (unable to dorsiflex right foot at all), negative dysarthria, negative expressive aphasia, negative receptive aphasia.   .     Data Reviewed: Care during the described time interval was provided by me .  I have reviewed this patient's available data, including medical history, events of note, physical examination, and all test results as part of my evaluation. I have personally reviewed and interpreted all radiology studies.  CBC:  Recent Labs Lab 11/14/15 0351 11/15/15 0430 11/16/15 0400 11/18/15 0358 11/19/15 0406  WBC 5.9 5.6 7.3 9.6 9.1  NEUTROABS 3.2 3.3 4.7 6.2 5.9  HGB 7.4* 7.7* 8.3* 9.3* 9.7*  HCT 25.3* 26.2* 28.5* 31.4* 32.4*  MCV 88.2 88.2 88.0 86.5 85.9  PLT 286 284 359 298 0000000   Basic Metabolic Panel:  Recent Labs Lab 11/15/15 0430 11/16/15 0400 11/17/15 0419 11/18/15 0358 11/19/15 0406  NA 141 140 139 138 138  K 3.3* 3.3* 3.8 3.5 3.4*  CL 104 99* 96* 94* 94*  CO2 30 34* 32 32 32  GLUCOSE 143* 142* 113* 117* 101*  BUN 10 11 12 15  11  CREATININE 0.69 0.76 1.06 1.17 1.06  CALCIUM 9.1 9.3 9.8 9.5 9.6  MG 1.8 1.6* 1.6* 2.0 1.9  PHOS 4.6 5.2* 4.7* 5.3* 4.8*   GFR: Estimated Creatinine Clearance: 96.9 mL/min (by C-G formula based on Cr of 1.06). Liver Function Tests:  Recent Labs Lab 11/15/15 0430 11/16/15 0400 11/17/15 0419 11/18/15 0358 11/19/15 0406  AST  --   --   --   --  17  ALT  --   --   --   --  18  ALKPHOS  --   --   --   --  79  BILITOT  --   --   --   --  0.3  PROT  --   --   --   --  7.7  ALBUMIN 2.0* 2.2* 2.5* 2.5* 2.6*    No results for input(s): LIPASE, AMYLASE in the last 168 hours. No results for input(s): AMMONIA in the last 168 hours. Coagulation Profile:  Recent Labs Lab 11/15/15 1315  INR 1.10   Cardiac Enzymes: No results for input(s): CKTOTAL, CKMB, CKMBINDEX, TROPONINI in the last 168 hours. BNP (last 3 results) No results for input(s): PROBNP in the last 8760 hours. HbA1C: No results for input(s): HGBA1C in the last 72 hours. CBG:  Recent Labs Lab 11/18/15 2154 11/18/15 2333 11/19/15 0407 11/19/15 0802 11/19/15 1146  GLUCAP 227* 215* 108* 166* 298*   Lipid Profile:  Recent Labs  11/18/15 0358 11/19/15 0406  TRIG 205* 202*   Thyroid Function Tests: No results for input(s): TSH, T4TOTAL, FREET4, T3FREE, THYROIDAB in the last 72 hours. Anemia Panel: No results for input(s): VITAMINB12, FOLATE, FERRITIN, TIBC, IRON, RETICCTPCT in the last 72 hours. Urine analysis:    Component Value Date/Time   COLORURINE YELLOW 11/10/2015 1720   APPEARANCEUR HAZY* 11/10/2015 1720   LABSPEC 1.014 11/10/2015 1720   PHURINE 6.0 11/10/2015 1720   GLUCOSEU NEGATIVE 11/10/2015 1720   HGBUR SMALL* 11/10/2015 1720   BILIRUBINUR NEGATIVE 11/10/2015 1720   KETONESUR 15* 11/10/2015 1720   PROTEINUR 100* 11/10/2015 1720   UROBILINOGEN 1.0 02/03/2014 0052   NITRITE NEGATIVE 11/10/2015 1720   LEUKOCYTESUR SMALL* 11/10/2015 1720   Sepsis Labs: @LABRCNTIP (procalcitonin:4,lacticidven:4)  ) Recent Results (from the past 240 hour(s))  Culture, blood (routine x 2)     Status: None   Collection Time: 11/10/15  3:27 PM  Result Value Ref Range Status   Specimen Description BLOOD RIGHT HAND  Final   Special Requests BOTTLES DRAWN AEROBIC ONLY 4CC  Final   Culture NO GROWTH 5 DAYS  Final   Report Status 11/15/2015 FINAL  Final  Culture, blood (routine x 2)     Status: None   Collection Time: 11/10/15  4:06 PM  Result Value Ref Range Status   Specimen Description BLOOD RIGHT HAND  Final    Special Requests IN PEDIATRIC BOTTLE 4CC  Final   Culture NO GROWTH 5 DAYS  Final   Report Status 11/15/2015 FINAL  Final  Culture, Urine     Status: None   Collection Time: 11/10/15  5:20 PM  Result Value Ref Range Status   Specimen Description URINE, CATHETERIZED  Final   Special Requests Rocephin T Cefepime Normal  Final   Culture NO GROWTH  Final   Report Status 11/11/2015 FINAL  Final  Culture, respiratory (NON-Expectorated)     Status: None   Collection Time: 11/11/15  8:50 AM  Result Value Ref Range Status   Specimen Description TRACHEAL ASPIRATE  Final   Special Requests Normal  Final   Gram Stain   Final    ABUNDANT WBC PRESENT,BOTH PMN AND MONONUCLEAR RARE SQUAMOUS EPITHELIAL CELLS PRESENT FEW GRAM POSITIVE COCCI IN PAIRS RARE GRAM POSITIVE RODS RARE GRAM NEGATIVE COCCOBACILLI    Culture   Final    ABUNDANT METHICILLIN RESISTANT STAPHYLOCOCCUS AUREUS   Report Status 11/13/2015 FINAL  Final   Organism ID, Bacteria METHICILLIN RESISTANT STAPHYLOCOCCUS AUREUS  Final      Susceptibility   Methicillin resistant staphylococcus aureus - MIC*    CIPROFLOXACIN >=8 RESISTANT Resistant     ERYTHROMYCIN >=8 RESISTANT Resistant     GENTAMICIN <=0.5 SENSITIVE Sensitive     OXACILLIN >=4 RESISTANT Resistant     TETRACYCLINE <=1 SENSITIVE Sensitive     VANCOMYCIN 1 SENSITIVE Sensitive     TRIMETH/SULFA <=10 SENSITIVE Sensitive     CLINDAMYCIN <=0.25 SENSITIVE Sensitive     RIFAMPIN <=0.5 SENSITIVE Sensitive     Inducible Clindamycin NEGATIVE Sensitive     * ABUNDANT METHICILLIN RESISTANT STAPHYLOCOCCUS AUREUS         Radiology Studies: No results found.      Scheduled Meds: . collagenase   Topical Daily  . feeding supplement (ENSURE ENLIVE)  237 mL Oral BID BM  . furosemide  20 mg Intravenous Daily  . haloperidol lactate  5 mg Intravenous BID  . heparin subcutaneous  5,000 Units Subcutaneous Q8H  . insulin aspart  0-20 Units Subcutaneous Q4H  . insulin glargine   15 Units Subcutaneous QHS  . oxyCODONE  90 mg Oral Q12H  . pantoprazole  40 mg Oral Daily  . PARoxetine  40 mg Per Tube Daily  . pregabalin  150 mg Oral Q8H  . senna  1 tablet Oral BID  . tamsulosin  0.4 mg Oral Daily  . vancomycin  750 mg Intravenous Q12H   Continuous Infusions: . lactated ringers 20 mL/hr at 11/19/15 0005     LOS: 12 days    Time spent: 40 minutes    Riot Waterworth, Geraldo Docker, MD [facility] Pager [phone]   If 7PM-7AM, please contact night-coverage [email or web address] Password [email or web address] 11/19/2015, 1:35 PM

## 2015-11-20 ENCOUNTER — Inpatient Hospital Stay (HOSPITAL_COMMUNITY): Payer: Medicaid Other

## 2015-11-20 DIAGNOSIS — I509 Heart failure, unspecified: Secondary | ICD-10-CM

## 2015-11-20 DIAGNOSIS — G061 Intraspinal abscess and granuloma: Secondary | ICD-10-CM | POA: Diagnosis present

## 2015-11-20 LAB — CBC WITH DIFFERENTIAL/PLATELET
BASOS ABS: 0.1 10*3/uL (ref 0.0–0.1)
Basophils Relative: 1 %
Eosinophils Absolute: 0.3 10*3/uL (ref 0.0–0.7)
Eosinophils Relative: 4 %
HEMATOCRIT: 30.2 % — AB (ref 39.0–52.0)
HEMOGLOBIN: 9 g/dL — AB (ref 13.0–17.0)
LYMPHS PCT: 31 %
Lymphs Abs: 2.4 10*3/uL (ref 0.7–4.0)
MCH: 25.7 pg — ABNORMAL LOW (ref 26.0–34.0)
MCHC: 29.8 g/dL — ABNORMAL LOW (ref 30.0–36.0)
MCV: 86.3 fL (ref 78.0–100.0)
MONO ABS: 0.5 10*3/uL (ref 0.1–1.0)
Monocytes Relative: 6 %
NEUTROS ABS: 4.6 10*3/uL (ref 1.7–7.7)
Neutrophils Relative %: 58 %
Platelets: 251 10*3/uL (ref 150–400)
RBC: 3.5 MIL/uL — ABNORMAL LOW (ref 4.22–5.81)
RDW: 16.5 % — AB (ref 11.5–15.5)
WBC: 7.8 10*3/uL (ref 4.0–10.5)

## 2015-11-20 LAB — COMPREHENSIVE METABOLIC PANEL
ALBUMIN: 2.5 g/dL — AB (ref 3.5–5.0)
ALK PHOS: 76 U/L (ref 38–126)
ALT: 16 U/L — AB (ref 17–63)
ANION GAP: 11 (ref 5–15)
AST: 15 U/L (ref 15–41)
BUN: 10 mg/dL (ref 6–20)
CALCIUM: 9.4 mg/dL (ref 8.9–10.3)
CO2: 30 mmol/L (ref 22–32)
Chloride: 98 mmol/L — ABNORMAL LOW (ref 101–111)
Creatinine, Ser: 1.07 mg/dL (ref 0.61–1.24)
GFR calc non Af Amer: >60 mL/min (ref >60)
GLUCOSE: 103 mg/dL — AB (ref 65–99)
POTASSIUM: 3.4 mmol/L — AB (ref 3.5–5.1)
SODIUM: 139 mmol/L (ref 135–145)
TOTAL PROTEIN: 6.9 g/dL (ref 6.5–8.1)
Total Bilirubin: 0.3 mg/dL (ref 0.3–1.2)

## 2015-11-20 LAB — PHOSPHORUS: Phosphorus: 4.9 mg/dL — ABNORMAL HIGH (ref 2.5–4.6)

## 2015-11-20 LAB — GLUCOSE, CAPILLARY
GLUCOSE-CAPILLARY: 116 mg/dL — AB (ref 65–99)
GLUCOSE-CAPILLARY: 114 mg/dL — AB (ref 65–99)
Glucose-Capillary: 169 mg/dL — ABNORMAL HIGH (ref 65–99)
Glucose-Capillary: 172 mg/dL — ABNORMAL HIGH (ref 65–99)
Glucose-Capillary: 216 mg/dL — ABNORMAL HIGH (ref 65–99)
Glucose-Capillary: 243 mg/dL — ABNORMAL HIGH (ref 65–99)

## 2015-11-20 LAB — ECHOCARDIOGRAM COMPLETE
Height: 73 in
WEIGHTICAEL: 3568 [oz_av]

## 2015-11-20 LAB — URINE CULTURE: Culture: NO GROWTH

## 2015-11-20 LAB — TRIGLYCERIDES: Triglycerides: 236 mg/dL — ABNORMAL HIGH (ref <150)

## 2015-11-20 LAB — MAGNESIUM: Magnesium: 1.9 mg/dL (ref 1.7–2.4)

## 2015-11-20 MED ORDER — POTASSIUM CHLORIDE CRYS ER 10 MEQ PO TBCR
50.0000 meq | EXTENDED_RELEASE_TABLET | Freq: Once | ORAL | Status: AC
  Administered 2015-11-20: 50 meq via ORAL
  Filled 2015-11-20: qty 2

## 2015-11-20 MED ORDER — MAGNESIUM SULFATE 2 GM/50ML IV SOLN
2.0000 g | Freq: Once | INTRAVENOUS | Status: AC
Start: 2015-11-20 — End: 2015-11-20
  Administered 2015-11-20: 2 g via INTRAVENOUS
  Filled 2015-11-20: qty 50

## 2015-11-20 NOTE — Progress Notes (Signed)
PROGRESS NOTE    Gregory [name]  [name] DOB: 08/11/1960 DOA: 11/07/2015 PCP: Tamsen Roers, MD   Brief Narrative:  55 yo WM PMHx Diabetes mellitus without complication , HTN, COPD (not on home O2), DDD (degenerative disc disease), lumbar Osteomyelitis, Chronic Pain Syndrome (lower back),Chronic Urinary retention. The following history obtained from [place] B[place] Long RN) C-spine osteomyelitis and L-spine abscess times ~4 months. Initially seen at [facility] where surgery was contemplated. Daughter [name] why surgery not undertaken but decided long-term antibiotics. Patient received 2 months IV antibiotics (completed course 1 May), was then discharged to [facility].  Brought to ER with altered mental status. He resides at [facility]. He was hypoxic, diaphoretic. He was given Narcan and then became agitated. He was given ativan, and became sedated. He was noted to have fever, low BP. He was intubated for airway protection. He had purulent appearing urine. He was started on antibiotics, IV fluids, and pressors.  He was tx for MRSA bacteremia in February 2017 and multifocal osteomyelitis of Lt foot, Lt shoulder, and spine.  @@55  yo male from NH with acute encephalopathy 2nd to septic shock from UTI, AKI, VDRF, and chronic opiate use. He has hx of MRSA bacteremia with diffuse areas of osteomyelitis in February 2017. Admitted with E coli UTI. Patient continuing on vancomycin for osteomyelitis/discitis. Now has MRSA pneumonia/tracheobronchitis. Continuing vancomycin. ID following. Delirium seems to slowly be improving on Seroquel & Haldol IV, able to wean off methadone, precedex minimal. Restarted home chronic pain meds, and home pregabalin and paxil. Much less agitation this AM.  Assessment & Plan:   Active Problems:   Septic shock (HCC)   Altered mental status   Pressure ulcer   Acute respiratory failure with hypoxemia (HCC)   Sepsis (HCC)   UTI (lower urinary tract  infection)   Osteomyelitis (HCC)   Encounter for central line placement   Encounter for orogastric (OG) tube placement   Endotracheally intubated   Osteomyelitis of cervical spine (HCC)   Cervical discitis   Sepsis due to methicillin resistant Staphylococcus aureus (MRSA) ([place])   CAP (community acquired pneumonia) due to MRSA (methicillin resistant Staphylococcus aureus) ([place])   Escherichia coli urinary tract infection   Chronic diastolic CHF (congestive heart failure) (HCC)   Pulmonary hypertension (HCC)   Essential hypertension   HLD (hyperlipidemia)   Acute renal failure (HCC)   Urinary retention   Uncontrolled type 2 diabetes mellitus with complication (HCC)   Discitis of cervical region   Synovial cyst of lumbar spine   Weakness of both lower extremities   Foot drop, bilateral   Pneumonia of both lower lobes due to methicillin resistant Staphylococcus aureus (MRSA) (HCC)   Opioid use with withdrawal (HCC)   Acute respiratory failure with hypoxia (HCC)  C5-6 Discitis/positive MRSA Osteomyelitis Cervical spine w/ Trace Epidural Abscess/L4-5 Foraminal Cyst vs abscess -Vancomycin 57 days per ID  -Last seen by Neurosurgery on 5/26. -Air mattress overlay; ensure on rotation at all times -Aspen collar per neurosurgery -Spoke with Dr. Kristeen Miss Neurosurgery; concerned that patient was able to ambulate in January and now has bilateral lower extremity weakness with footdrop. -Per patient plan is for neurosurgery to perform C-spine surgery this week -Neurosurgery note 6/4 did not address plan for L-spine surgery, though required. Speak with neurosurgery again question would L-spine surgery alleviate subacute lower extremity weakness/pain?  Bilateral lower extremity weakness/bilateral foot drop -L-spine cyst/abscess with severe canal stenosis and disc Bulge L4-5 -Will hold on starting  dexamethasone until neurosurgery recommendations  Sepsis/positive MRSA  Pneumonia/Tracheobronchitis -Completed course antibiotics  E coli UTI -Completed course of antibiotics  Positive MRSA bacteremia -Completed course of antibiotics -6/3 Blood culture pending. If negative will have PICC line placed -Will require TEE  Pain management/Neuropathy/withdrawal from high-dose opioids (oxycodone= 240 mg daily+ Zanaflex) -The following pain medication was abruptly DCed one patient was transferred to SDU;Versed drip 5/31,Fentanyl gtt 6/1, Fentanyl IV injection  5/31, Precedex 6/1, Methadone  5/31,Dilaudid  6/1,Seroquel bid 6/1. Patient was then only restarted on OxyContin 90 mg BID(NOTE patient was on up to 240 mg daily of OxyContin/oxycodone+ Zanaflex at home) -CIWA protocol -OxyContin 90 mg BID -Lyrica 150 mg TID -Haldol 5 mg BID  Acute Hypoxic Respiratory Failure/COPD -Most likely multifactorial to include MRSA pneumonia, COPD exacerbation, and overdose on narcotics, required Narcan in ED.  Chronic diastolic CHF -0000000 echocardiogram showed LVH, pulmonary hypertension, multiple chambers dilated. -Echocardiogram pending  Chronic Diastolic CHF -Echocardiogram; mild diastolic dysfunction see results below -Strict in and out since admission +477ml -Daily weight Filed Weights   11/18/15 2316 11/19/15 0500 11/20/15 0431  Weight: 97.7 kg (215 lb 6.2 oz) 100.245 kg (221 lb) 101.152 kg (223 lb)  -EKG on 6/3 -Transfuse for hemoglobin<8  Recent Labs Lab 11/15/15 0430 11/16/15 0400 11/18/15 0358 11/19/15 0406 11/20/15 0409  HGB 7.7* 8.3* 9.3* 9.7* 9.0*    HTN  -Much better controlled after obtaining adequate pain control.  HLD   Acute Renal Failure( Baseline Creatinine 0.98) Lab Results  Component Value Date   CREATININE 1.07 11/20/2015   CREATININE 1.06 11/19/2015   CREATININE 1.17 11/18/2015  . Chronic Urinary Retention -Most likely secondary to L-spine abscess, and bladder damage secondary urinary retention. Has seen urologist per daughter  who has confirmed bladder damage  Anemia - No signs of active bleeding. S -5/24 transfuse 1u PRBC  Recent Labs Lab 11/15/15 0430 11/16/15 0400 11/18/15 0358 11/19/15 0406 11/20/15 0409  HGB 7.7* 8.3* 9.3* 9.7* 9.0*    DM type II uncontrolled with complications -[id number] hemoglobin A1c= 7.2 -Increase Lantus 15 units daily -Resistant SSI -Hold outpt amaryl, lantus, & victoza  Hypokalemia -Potassium goal> 4 -K Dur 50 mEq  Hypomagnesemia -Magnesium goal>2 -Magnesium IV 2 gm     DVT prophylaxis: Subcutaneous heparin Code Status: Full Family Communication: Spoke RN [name]  [place] Bells Long RN) and updated her that C-spine surgery will probably occur this week. Disposition Plan: C-spine surgery this week?   Consultants:  Dr.Robert W Comer infectious disease Dr Consuella Lose. Neurosurgery   Procedures/Significant Events:  5/22 CT head: mild small vessel ischemic changes 5/24 Port CXR: ETT & L IJ CVL in good position. No new focal opacity. 5/24 Port Renal U/S: No mass or hydronephrosis. 5/25 CT Abd/Pelvis: Splenomegaly noted. Inflammatory changes adjacent bladder. 5/26 MRI Spine: C5-6 discitis osteomyelitis w/ trace epidural abscess.  - Grade 1 L4-5 anterolisthesis-- bright STIR signal and enhancingbone marrow signal within the RIGHT L4-5 facet. -Congenital canal narrowing moderate L4-5 and mild L5-S1 disc height loss-- mild desiccation.-- Mild acute on chronic discogenic endplate changes at 075-GRM. -Central displacement of the cauda equina due to canal stenosis.  -L3-4: Large 5 mm broad-based disc bulge. -Moderate facet arthropathy and ligamentum flavum redundancy. -Severe canal stenosis. -Moderate to severe bilateral neural foraminal narrowing. -L4-5: Anterolisthesis. Moderate broad-based disc bulge. Severe facet arthropathy and ligamentum flavum redundancy -Severe canal stenosis. Severe RIGHT greater than LEFT neural foraminal narrowing. Abnormal signal within the  RIGHT L4-5 lateral recess extend toward the RIGHT L5-S1  neural foramen, contiguous with the RIGHT facet with superimposed enhancement.  -6 mm RIGHT facet synovial cyst, less likely abscess.  -L5-S1: Moderate RIGHT, mild LEFT neural foraminal narrowing. 5/26 R Foot X-ray: Soft tissue ulcer at tip of the first distal phalanx. No evidence of osteomyelitis and right foot. 6/4 echocardiogram;- LVEF= 55% to 60%.-- (grade 1 diastolic dysfunction). - Pericardium, extracardiac: A small pericardial effusion     Cultures 5/22 Blood: Negative  5/22 Urine:Positive E coli 5/23 Blood: Negative  5/23 Urine: Negative 5/25 Blood >> NGTD 5/25 Urine: Negative 5/26 Trach Asp: Positive MRSA 6/3 blood pending 6/3 urine pending   Antimicrobials: Zosyn 5/22>>>off Cefepime 5/22 - 5/25 Rocephin 5/25 - 5/26 Vancomycin 5/22 - 5/24; 5/26>>   Devices    LINES / TUBES:  OETT 7.5 5/22 - 5/27; 5/27 >> L IJ CVL 5/22 >>fever 6/1 OGT 5/22 >> DCed? Foley 5/22 >>     Continuous Infusions: . lactated ringers 20 mL/hr at 11/19/15 2246     Subjective: 6/4  A/O 4, Patient resting comfortably in bed. States pain much better controlled on his new regimen. Patient now has footdrop boots in place.    Objective: Filed Vitals:   11/20/15 0000 11/20/15 0025 11/20/15 0422 11/20/15 0431  BP: 141/78 141/78    Pulse:  88    Temp:  98.1 F (36.7 C) 98.1 F (36.7 C)   TempSrc:  Oral Oral   Resp:  11    Height:      Weight:    101.152 kg (223 lb)  SpO2:  96%      Intake/Output Summary (Last 24 hours) at 11/20/15 0756 Last data filed at 11/20/15 [id number]  Gross per 24 hour  Intake 1293.67 ml  Output   3300 ml  Net -2006.33 ml   Filed Weights   11/18/15 2316 11/19/15 0500 11/20/15 0431  Weight: 97.7 kg (215 lb 6.2 oz) 100.245 kg (221 lb) 101.152 kg (223 lb)    Examination:  General:  A/O 4, Pain is controlled,, No acute respiratory distress Eyes: negative scleral hemorrhage, negative  anisocoria, negative icterus ENT: Negative Runny nose, negative gingival bleeding, Neck:  Negative scars, masses, torticollis, lymphadenopathy, JVD Lungs: Clear to auscultation bilaterally without wheezes or crackles Cardiovascular: Tachycardic, Regular rhythm without murmur gallop or rub normal S1 and S2 Abdomen: negative abdominal pain, nondistended, positive soft, bowel sounds, no rebound, no ascites, no appreciable mass Extremities: patient has bilateral foot drop Rt>>Lt, ulcer on the right first metatarsal partially healed negative discharge Clean and covered.   Skin: Rosacea?  Psychiatric:  Negative depression, negative anxiety, negative fatigue, opioid withdrawal  SSx under control.   Neurologic:  Cranial nerves II through XII intact, tongue/uvula midline, all extremities muscle strength 5/5,Bilateral foot drop Rt>>Lt (unable to dorsiflex right foot at all), negative dysarthria, negative expressive aphasia, negative receptive aphasia.   .     Data Reviewed: Care during the described time interval was provided by me .  I have reviewed this patient's available data, including medical history, events of note, physical examination, and all test results as part of my evaluation. I have personally reviewed and interpreted all radiology studies.  CBC:  Recent Labs Lab 11/15/15 0430 11/16/15 0400 11/18/15 0358 11/19/15 0406 11/20/15 0409  WBC 5.6 7.3 9.6 9.1 7.8  NEUTROABS 3.3 4.7 6.2 5.9 4.6  HGB 7.7* 8.3* 9.3* 9.7* 9.0*  HCT 26.2* 28.5* 31.4* 32.4* 30.2*  MCV 88.2 88.0 86.5 85.9 86.3  PLT 284 359 298 324 251  Basic Metabolic Panel:  Recent Labs Lab 11/16/15 0400 11/17/15 0419 11/18/15 0358 11/19/15 0406 11/20/15 0409  NA 140 139 138 138 139  K 3.3* 3.8 3.5 3.4* 3.4*  CL 99* 96* 94* 94* 98*  CO2 34* 32 32 32 30  GLUCOSE 142* 113* 117* 101* 103*  BUN 11 12 15 11 10   CREATININE 0.76 1.06 1.17 1.06 1.07  CALCIUM 9.3 9.8 9.5 9.6 9.4  MG 1.6* 1.6* 2.0 1.9 1.9  PHOS  5.2* 4.7* 5.3* 4.8* 4.9*   GFR: Estimated Creatinine Clearance: 96.4 mL/min (by C-G formula based on Cr of 1.07). Liver Function Tests:  Recent Labs Lab 11/16/15 0400 11/17/15 0419 11/18/15 0358 11/19/15 0406 11/20/15 0409  AST  --   --   --  17 15  ALT  --   --   --  18 16*  ALKPHOS  --   --   --  79 76  BILITOT  --   --   --  0.3 0.3  PROT  --   --   --  7.7 6.9  ALBUMIN 2.2* 2.5* 2.5* 2.6* 2.5*   No results for input(s): LIPASE, AMYLASE in the last 168 hours. No results for input(s): AMMONIA in the last 168 hours. Coagulation Profile:  Recent Labs Lab 11/15/15 1315  INR 1.10   Cardiac Enzymes: No results for input(s): CKTOTAL, CKMB, CKMBINDEX, TROPONINI in the last 168 hours. BNP (last 3 results) No results for input(s): PROBNP in the last 8760 hours. HbA1C: No results for input(s): HGBA1C in the last 72 hours. CBG:  Recent Labs Lab 11/19/15 1534 11/19/15 1924 11/20/15 0024 11/20/15 0426 11/20/15 0723  GLUCAP 137* 218* 172* 116* 114*   Lipid Profile:  Recent Labs  11/19/15 0406 11/20/15 0409  TRIG 202* 236*   Thyroid Function Tests: No results for input(s): TSH, T4TOTAL, FREET4, T3FREE, THYROIDAB in the last 72 hours. Anemia Panel: No results for input(s): VITAMINB12, FOLATE, FERRITIN, TIBC, IRON, RETICCTPCT in the last 72 hours. Urine analysis:    Component Value Date/Time   COLORURINE YELLOW 11/10/2015 1720   APPEARANCEUR HAZY* 11/10/2015 1720   LABSPEC 1.014 11/10/2015 1720   PHURINE 6.0 11/10/2015 1720   GLUCOSEU NEGATIVE 11/10/2015 1720   HGBUR SMALL* 11/10/2015 1720   BILIRUBINUR NEGATIVE 11/10/2015 1720   KETONESUR 15* 11/10/2015 1720   PROTEINUR 100* 11/10/2015 1720   UROBILINOGEN 1.0 02/03/2014 0052   NITRITE NEGATIVE 11/10/2015 1720   LEUKOCYTESUR SMALL* 11/10/2015 1720   Sepsis Labs: @LABRCNTIP (procalcitonin:4,lacticidven:4)  ) Recent Results (from the past 240 hour(s))  Culture, blood (routine x 2)     Status: None    Collection Time: 11/10/15  3:27 PM  Result Value Ref Range Status   Specimen Description BLOOD RIGHT HAND  Final   Special Requests BOTTLES DRAWN AEROBIC ONLY 4CC  Final   Culture NO GROWTH 5 DAYS  Final   Report Status 11/15/2015 FINAL  Final  Culture, blood (routine x 2)     Status: None   Collection Time: 11/10/15  4:06 PM  Result Value Ref Range Status   Specimen Description BLOOD RIGHT HAND  Final   Special Requests IN PEDIATRIC BOTTLE 4CC  Final   Culture NO GROWTH 5 DAYS  Final   Report Status 11/15/2015 FINAL  Final  Culture, Urine     Status: None   Collection Time: 11/10/15  5:20 PM  Result Value Ref Range Status   Specimen Description URINE, CATHETERIZED  Final   Special Requests Rocephin T  Cefepime Normal  Final   Culture NO GROWTH  Final   Report Status 11/11/2015 FINAL  Final  Culture, respiratory (NON-Expectorated)     Status: None   Collection Time: 11/11/15  8:50 AM  Result Value Ref Range Status   Specimen Description TRACHEAL ASPIRATE  Final   Special Requests Normal  Final   Gram Stain   Final    ABUNDANT WBC PRESENT,BOTH PMN AND MONONUCLEAR RARE SQUAMOUS EPITHELIAL CELLS PRESENT FEW GRAM POSITIVE COCCI IN PAIRS RARE GRAM POSITIVE RODS RARE GRAM NEGATIVE COCCOBACILLI    Culture   Final    ABUNDANT METHICILLIN RESISTANT STAPHYLOCOCCUS AUREUS   Report Status 11/13/2015 FINAL  Final   Organism ID, Bacteria METHICILLIN RESISTANT STAPHYLOCOCCUS AUREUS  Final      Susceptibility   Methicillin resistant staphylococcus aureus - MIC*    CIPROFLOXACIN >=8 RESISTANT Resistant     ERYTHROMYCIN >=8 RESISTANT Resistant     GENTAMICIN <=0.5 SENSITIVE Sensitive     OXACILLIN >=4 RESISTANT Resistant     TETRACYCLINE <=1 SENSITIVE Sensitive     VANCOMYCIN 1 SENSITIVE Sensitive     TRIMETH/SULFA <=10 SENSITIVE Sensitive     CLINDAMYCIN <=0.25 SENSITIVE Sensitive     RIFAMPIN <=0.5 SENSITIVE Sensitive     Inducible Clindamycin NEGATIVE Sensitive     * ABUNDANT  METHICILLIN RESISTANT STAPHYLOCOCCUS AUREUS         Radiology Studies: No results found.      Scheduled Meds: . collagenase   Topical Daily  . feeding supplement (ENSURE ENLIVE)  237 mL Oral BID BM  . furosemide  20 mg Intravenous Daily  . haloperidol lactate  5 mg Intravenous BID  . heparin subcutaneous  5,000 Units Subcutaneous Q8H  . insulin aspart  0-20 Units Subcutaneous Q4H  . insulin glargine  15 Units Subcutaneous QHS  . oxyCODONE  90 mg Oral Q12H  . pantoprazole  40 mg Oral Daily  . PARoxetine  40 mg Per Tube Daily  . pregabalin  150 mg Oral Q8H  . senna  1 tablet Oral BID  . tamsulosin  0.4 mg Oral Daily  . vancomycin  750 mg Intravenous Q12H   Continuous Infusions: . lactated ringers 20 mL/hr at 11/19/15 2246     LOS: 13 days    Time spent: 40 minutes    WOODS, Geraldo Docker, MD [facility] Pager [phone]   If 7PM-7AM, please contact night-coverage [email or web address] Password [email or web address] 11/20/2015, 7:56 AM

## 2015-11-20 NOTE — Progress Notes (Signed)
2Orthopedic Tech Progress Note Patient Details:  Gregory [name] 1961-01-09 [id number]  Ortho Devices Type of Ortho Device: Postop shoe/boot Ortho Device/Splint Location: Bilateral Prafo boots Ortho Device/Splint Interventions: Application   [place] Pink 11/20/2015, 9:18 AM

## 2015-11-20 NOTE — Progress Notes (Signed)
Patient ID: Gregory [name], male   DOB: April 22, 1961, 55 y.o.   MRN: [id number] Vital signs are stable Patient is functionally improving Still has marked weakness in upper and lower extremities He'll require surgical decompression of the cervical spine Dr. Marylyn Ishihara will make arrangements

## 2015-11-20 NOTE — Progress Notes (Signed)
  Echocardiogram 2D Echocardiogram has been performed.  Donata Clay 11/20/2015, 4:14 PM

## 2015-11-21 ENCOUNTER — Ambulatory Visit: Payer: BLUE CROSS/BLUE SHIELD | Admitting: Internal Medicine

## 2015-11-21 LAB — CBC WITH DIFFERENTIAL/PLATELET
BASOS ABS: 0.1 10*3/uL (ref 0.0–0.1)
Basophils Relative: 1 %
Eosinophils Absolute: 0.3 10*3/uL (ref 0.0–0.7)
Eosinophils Relative: 4 %
HEMATOCRIT: 31.5 % — AB (ref 39.0–52.0)
Hemoglobin: 9.1 g/dL — ABNORMAL LOW (ref 13.0–17.0)
LYMPHS PCT: 27 %
Lymphs Abs: 2 10*3/uL (ref 0.7–4.0)
MCH: 25.2 pg — ABNORMAL LOW (ref 26.0–34.0)
MCHC: 28.9 g/dL — ABNORMAL LOW (ref 30.0–36.0)
MCV: 87.3 fL (ref 78.0–100.0)
MONO ABS: 0.5 10*3/uL (ref 0.1–1.0)
Monocytes Relative: 7 %
NEUTROS ABS: 4.5 10*3/uL (ref 1.7–7.7)
Neutrophils Relative %: 61 %
Platelets: 280 10*3/uL (ref 150–400)
RBC: 3.61 MIL/uL — AB (ref 4.22–5.81)
RDW: 16.6 % — AB (ref 11.5–15.5)
WBC: 7.5 10*3/uL (ref 4.0–10.5)

## 2015-11-21 LAB — GLUCOSE, CAPILLARY
GLUCOSE-CAPILLARY: 116 mg/dL — AB (ref 65–99)
GLUCOSE-CAPILLARY: 123 mg/dL — AB (ref 65–99)
GLUCOSE-CAPILLARY: 137 mg/dL — AB (ref 65–99)
GLUCOSE-CAPILLARY: 162 mg/dL — AB (ref 65–99)
GLUCOSE-CAPILLARY: 190 mg/dL — AB (ref 65–99)
GLUCOSE-CAPILLARY: 191 mg/dL — AB (ref 65–99)
Glucose-Capillary: 193 mg/dL — ABNORMAL HIGH (ref 65–99)

## 2015-11-21 LAB — COMPREHENSIVE METABOLIC PANEL
ALBUMIN: 2.7 g/dL — AB (ref 3.5–5.0)
ALK PHOS: 80 U/L (ref 38–126)
ALT: 15 U/L — ABNORMAL LOW (ref 17–63)
AST: 15 U/L (ref 15–41)
Anion gap: 9 (ref 5–15)
BILIRUBIN TOTAL: 0.3 mg/dL (ref 0.3–1.2)
BUN: 11 mg/dL (ref 6–20)
CALCIUM: 9.2 mg/dL (ref 8.9–10.3)
CO2: 31 mmol/L (ref 22–32)
CREATININE: 1.08 mg/dL (ref 0.61–1.24)
Chloride: 98 mmol/L — ABNORMAL LOW (ref 101–111)
GFR calc Af Amer: >60 mL/min (ref >60)
GFR calc non Af Amer: >60 mL/min (ref >60)
GLUCOSE: 115 mg/dL — AB (ref 65–99)
Potassium: 4 mmol/L (ref 3.5–5.1)
Sodium: 138 mmol/L (ref 135–145)
TOTAL PROTEIN: 6.9 g/dL (ref 6.5–8.1)

## 2015-11-21 LAB — PHOSPHORUS: Phosphorus: 4.9 mg/dL — ABNORMAL HIGH (ref 2.5–4.6)

## 2015-11-21 LAB — TRIGLYCERIDES: Triglycerides: 227 mg/dL — ABNORMAL HIGH (ref <150)

## 2015-11-21 LAB — MAGNESIUM: Magnesium: 2.1 mg/dL (ref 1.7–2.4)

## 2015-11-21 MED ORDER — POLYETHYLENE GLYCOL 3350 17 G PO PACK
17.0000 g | PACK | Freq: Every day | ORAL | Status: DC | PRN
  Administered 2015-11-22: 17 g via ORAL
  Filled 2015-11-21: qty 1

## 2015-11-21 MED ORDER — MAGNESIUM OXIDE 400 (241.3 MG) MG PO TABS
400.0000 mg | ORAL_TABLET | Freq: Two times a day (BID) | ORAL | Status: DC
  Administered 2015-11-21 – 2015-12-02 (×20): 400 mg via ORAL
  Filled 2015-11-21 (×21): qty 1

## 2015-11-21 MED ORDER — INSULIN ASPART 100 UNIT/ML ~~LOC~~ SOLN
0.0000 [IU] | Freq: Three times a day (TID) | SUBCUTANEOUS | Status: DC
  Administered 2015-11-21 – 2015-11-22 (×3): 4 [IU] via SUBCUTANEOUS
  Administered 2015-11-22: 3 [IU] via SUBCUTANEOUS
  Administered 2015-11-23 – 2015-11-24 (×5): 4 [IU] via SUBCUTANEOUS
  Administered 2015-11-24: 7 [IU] via SUBCUTANEOUS
  Administered 2015-11-25: 3 [IU] via SUBCUTANEOUS
  Administered 2015-11-26 (×2): 4 [IU] via SUBCUTANEOUS
  Administered 2015-11-26: 3 [IU] via SUBCUTANEOUS

## 2015-11-21 MED ORDER — HALOPERIDOL LACTATE 5 MG/ML IJ SOLN
2.0000 mg | Freq: Two times a day (BID) | INTRAMUSCULAR | Status: DC
  Administered 2015-11-21 – 2015-11-23 (×4): 2 mg via INTRAVENOUS
  Filled 2015-11-21 (×4): qty 1

## 2015-11-21 MED ORDER — SODIUM CHLORIDE 0.9 % IV SOLN
INTRAVENOUS | Status: DC
  Administered 2015-11-22 – 2015-11-25 (×3): via INTRAVENOUS

## 2015-11-21 MED ORDER — INSULIN ASPART 100 UNIT/ML ~~LOC~~ SOLN
0.0000 [IU] | Freq: Every day | SUBCUTANEOUS | Status: DC
  Administered 2015-11-26 – 2015-11-27 (×2): 2 [IU] via SUBCUTANEOUS

## 2015-11-21 MED ORDER — ACETAMINOPHEN 160 MG/5ML PO SOLN
650.0000 mg | Freq: Four times a day (QID) | ORAL | Status: DC | PRN
  Administered 2015-11-22 – 2015-12-02 (×6): 650 mg via ORAL
  Filled 2015-11-21 (×6): qty 20.3

## 2015-11-21 MED ORDER — DOCUSATE SODIUM 100 MG PO CAPS
100.0000 mg | ORAL_CAPSULE | Freq: Two times a day (BID) | ORAL | Status: DC
  Administered 2015-11-21 – 2015-11-23 (×5): 100 mg via ORAL
  Filled 2015-11-21 (×5): qty 1

## 2015-11-21 MED ORDER — DIAZEPAM 5 MG/ML IJ SOLN: 5.0000 mg | INTRAMUSCULAR | Status: DC | PRN

## 2015-11-21 NOTE — Progress Notes (Signed)
No issues overnight. Pt has made significant improvement since admission. He c/o bilateral arm and leg weakness for the past few months. He has been unable to walk because of his leg weakness.  EXAM:  BP 128/94 mmHg  Pulse 82  Temp(Src) 99.6 F (37.6 C) (Oral)  Resp 17  Ht 6\' 1"  (1.854 m)  Wt 101.152 kg (223 lb)  BMI 29.43 kg/m2  SpO2 93%  Awake, alert, oriented  Speech fluent, appropriate  CN grossly intact  Motor:   Delt Bicep Tri Wrist Ext Grip Finger Abd  R 4-/5 4+/5 4/5 4/5 4/5 3/5  L 4-/5 4+/5 4/5 4/5 4/5 3/5    Hip Flex Quad PF DF EHL  R 3/5 4-/5 0/5 0/5 0/5  L 3/5 4-/5 2/5 0/5 1/5   (+) Hoffman's bilaterally  IMPRESSION:  55 y.o. 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 He did have possible septic facet arthritis of L4-5 and degenerative spondylosis at L3-4 and L4-5 but this would not explain his diffuse leg and obviously not his arm weakness. Assuming he is now more medically stable, he requires decompression and stabilization of his cervical spine.  PLAN: - We spoke about a likely circumferential decompression/stabilization requiring C5/C6 corpectomy and strut graft placement from anterior approach, followed by C3-C7 laminectomy and lateral mass fusion from posterior approach. - I will plan on speaking with him and his daughter who is an Therapist, sports at [facility] tomorrow more about the surgery. We will tentatively plan on the anterior portion later this week.

## 2015-11-21 NOTE — Progress Notes (Signed)
[place] TEAM 1 - Stepdown/ICU TEAM  [name]  MY:[id number] DOB: [birth year] DOA: 11/07/2015 PCP: Tamsen Roers, MD    Brief Narrative:  55 yo M Hx DM, HTN, COPD, DDD, Chronic Urinary retention, and MRSA bacteremia w/ C-spine, L-spine, L foot, and L shoulder osteo initially seen at [facility] (Feb 2017) where surgery was contemplated. Daughter unsure why surgery not undertaken but long-term antibiotics were prescribed. Patient received 2 months IV antibiotics (completed course 1 May) and was discharged to [facility].  The pt was subsequently brought to ER 5/22 with altered mental status. He was hypoxic, diaphoretic. He was intubated for airway protection. He had purulent appearing urine. He was started on antibiotics, IV fluids, and pressors.  He was found to have an E coli UTI.  He remained on vancomycin for osteomyelitis/discitis, as well as newly appreciated MRSA pneumonia/tracheobronchitis.   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  -remains on vancomycin - Neurosurgery following and planning surgical decompression  Bilateral lower extremity weakness / bilateral foot drop -due to above - NS following   Sepsis due to MRSA Bacteremia w/ Pneumonia/Tracheobronchitis -abx per ID   E coli UTI -completed 6 day course of antibiotics  Pain management / withdrawal from high-dose opioids -on transfer from ICU narcotics were abruptly tapered, and pt went into withdrawal - now stable w/ pain reasonably controlled   Chronic Diastolic CHF -TTE 6/4 noted EF 55-60% and grade 1 DD - no volume overload on exam Filed Weights   11/19/15 0500 11/20/15 0431  Weight: 100.245 kg (221 lb) 101.152 kg (223 lb)  .  COPD -well compensated at present  HTN  -controlled   Acute Renal Failure baseline Cr 0.98  Chronic Urinary Retention -Most likely secondary to L-spine abscess, and bladder damage secondary urinary retention. Has seen urologist per daughter who has confirmed  bladder damage  Anemia -likely due to renal failure and chronic smoldering infection   DM2 -5/31A1c 7.2 - CBG currently well controlled   Hypokalemia -corrected   Hypomagnesemia -corrected   DVT prophylaxis: SQ heparin  Code Status: FULL CODE Family Communication: no family present at time of exam  Disposition Plan: SDU  Consultants:  ID Neurosurgery   Procedures: 6/4 TTE EF 55-60% - grade 1 diastolic dysfunction  Antimicrobials:  Zosyn 5/21 Cefepime 5/22 - 5/25 Rocephin 5/25 > 5/26 Vancomycin 5/22 >  Subjective: Pt is alert and conversant.  He states his pain is well controlled.  He denies cp, sob, n/v, or abdom pain.    Objective: Blood pressure 128/94, pulse 82, temperature 99.6 F (37.6 C), temperature source Oral, resp. rate 17, height 6\' 1"  (1.854 m), weight 101.152 kg (223 lb), SpO2 93 %.  Intake/Output Summary (Last 24 hours) at 11/21/15 1101 Last data filed at 11/21/15 0810  Gross per 24 hour  Intake    150 ml  Output   4650 ml  Net  -4500 ml   Filed Weights   11/19/15 0500 11/20/15 0431  Weight: 100.245 kg (221 lb) 101.152 kg (223 lb)    Examination: General: No acute respiratory distress Lungs: Clear to auscultation bilaterally without wheezes or crackles Cardiovascular: Regular rate and rhythm without murmur gallop or rub normal S1 and S2 Abdomen: Nontender, nondistended, soft, bowel sounds positive, no rebound, no ascites, no appreciable mass Extremities: No significant cyanosis, clubbing, or edema bilateral lower extremities  CBC:  Recent Labs Lab 11/16/15 0400 11/18/15 0358 11/19/15 0406 11/20/15 0409 11/21/15 0426  WBC 7.3 9.6 9.1 7.8  7.5  NEUTROABS 4.7 6.2 5.9 4.6 4.5  HGB 8.3* 9.3* 9.7* 9.0* 9.1*  HCT 28.5* 31.4* 32.4* 30.2* 31.5*  MCV 88.0 86.5 85.9 86.3 87.3  PLT 359 298 324 251 [id number]   Basic Metabolic Panel:  Recent Labs Lab 11/17/15 0419 11/18/15 0358 11/19/15 0406 11/20/15 0409 11/21/15 0426  NA 139 138 138 139  138  K 3.8 3.5 3.4* 3.4* 4.0  CL 96* 94* 94* 98* 98*  CO2 32 32 32 30 31  GLUCOSE 113* 117* 101* 103* 115*  BUN 12 15 11 10 11   CREATININE 1.06 1.17 1.06 1.07 1.08  CALCIUM 9.8 9.5 9.6 9.4 9.2  MG 1.6* 2.0 1.9 1.9 2.1  PHOS 4.7* 5.3* 4.8* 4.9* 4.9*   GFR: Estimated Creatinine Clearance: 95.5 mL/min (by C-G formula based on Cr of 1.08).  Liver Function Tests:  Recent Labs Lab 11/17/15 0419 11/18/15 0358 11/19/15 0406 11/20/15 0409 11/21/15 0426  AST  --   --  17 15 15   ALT  --   --  18 16* 15*  ALKPHOS  --   --  79 76 80  BILITOT  --   --  0.3 0.3 0.3  PROT  --   --  7.7 6.9 6.9  ALBUMIN 2.5* 2.5* 2.6* 2.5* 2.7*    Coagulation Profile:  Recent Labs Lab 11/15/15 1315  INR 1.10    HbA1C: HGB A1C MFR BLD  Date/Time Value Ref Range Status  11/16/2015 04:00 AM 7.2* 4.8 - 5.6 % Final    Comment:    (NOTE)         Pre-diabetes: 5.7 - 6.4         Diabetes: >6.4         Glycemic control for adults with diabetes: <7.0   04/14/2012 05:35 AM 7.5* <5.7 % Final    Comment:    (NOTE)                                                                       According to the ADA Clinical Practice Recommendations for 2011, when HbA1c is used as a screening test:  >=6.5%   Diagnostic of Diabetes Mellitus           (if abnormal result is confirmed) 5.7-6.4%   Increased risk of developing Diabetes Mellitus References:Diagnosis and Classification of Diabetes Mellitus,Diabetes [id number] 1):S62-S69 and Standards of Medical Care in         Diabetes - 2011,Diabetes [id number] (Suppl 1):S11-S61.    CBG:  Recent Labs Lab 11/20/15 1620 11/20/15 1934 11/20/15 2359 11/21/15 0348 11/21/15 0737  GLUCAP 216* 243* 162* 123* 116*    Recent Results (from the past 240 hour(s))  Culture, blood (Routine X 2) w Reflex to ID Panel     Status: None (Preliminary result)   Collection Time: 11/19/15 11:35 AM  Result Value Ref Range Status   Specimen Description BLOOD RIGHT  ANTECUBITAL  Final   Special Requests BOTTLES DRAWN AEROBIC AND ANAEROBIC 10CC  Final   Culture NO GROWTH 1 DAY  Final   Report Status PENDING  Incomplete  Culture, blood (Routine X 2) w Reflex to ID Panel     Status: None (Preliminary result)   Collection Time: 11/19/15 11:39 AM  Result Value Ref Range Status   Specimen Description BLOOD RIGHT ANTECUBITAL  Final   Special Requests IN PEDIATRIC BOTTLE  0.5CC  Final   Culture NO GROWTH 1 DAY  Final   Report Status PENDING  Incomplete  Culture, Urine     Status: None   Collection Time: 11/19/15  1:21 PM  Result Value Ref Range Status   Specimen Description URINE, RANDOM  Final   Special Requests NONE  Final   Culture NO GROWTH  Final   Report Status 11/20/2015 FINAL  Final     Scheduled Meds: . collagenase   Topical Daily  . feeding supplement (ENSURE ENLIVE)  237 mL Oral BID BM  . furosemide  20 mg Intravenous Daily  . haloperidol lactate  5 mg Intravenous BID  . heparin subcutaneous  5,000 Units Subcutaneous Q8H  . insulin aspart  0-20 Units Subcutaneous Q4H  . insulin glargine  15 Units Subcutaneous QHS  . oxyCODONE  90 mg Oral Q12H  . pantoprazole  40 mg Oral Daily  . PARoxetine  40 mg Per Tube Daily  . pregabalin  150 mg Oral Q8H  . senna  1 tablet Oral BID  . tamsulosin  0.4 mg Oral Daily  . vancomycin  750 mg Intravenous Q12H     LOS: 14 days   Time spent: 35 minutes   Cherene Altes, MD [facility] Office  [phone] Pager - Text Page per Amion as per below:  On-Call/Text Page:      [email or web address]      password [email or web address]  If 7PM-7AM, please contact night-coverage [email or web address] Password [email or web address] 11/21/2015, 11:01 AM

## 2015-11-21 NOTE — NC FL2 (Signed)
[place] MEDICAID FL2 LEVEL OF CARE SCREENING TOOL     IDENTIFICATION  Patient Name: Gregory [name] Birthdate: [birth year] Sex: male Admission Date (Current Location): 11/07/2015  [facility] and [place] Number:  Herbalist and Address:  [facility] [place] [address], [place], [place] [address]      Provider Number: [id number]  Attending Physician Name and Address:  Cherene Altes, MD  Relative Name and Phone Number:       Current Level of Care: Hospital Recommended Level of Care: Start Prior Approval Number:    Date Approved/Denied:   PASRR Number: [id number]  Discharge Plan: SNF    Current Diagnoses: Patient Active Problem List   Diagnosis Date Noted  . Abscess in epidural space of lumbar spine   . Osteomyelitis of cervical spine ([place])   . Cervical discitis   . Sepsis due to methicillin resistant Staphylococcus aureus (MRSA) ([place])   . CAP (community acquired pneumonia) due to MRSA (methicillin resistant Staphylococcus aureus) ([place])   . Escherichia coli urinary tract infection   . Chronic diastolic CHF (congestive heart failure) ([place])   . Pulmonary hypertension ([place])   . Essential hypertension   . HLD (hyperlipidemia)   . Acute renal failure ([place])   . Urinary retention   . Uncontrolled type 2 diabetes mellitus with complication ([place])   . Discitis of cervical region   . Synovial cyst of lumbar spine   . Weakness of both lower extremities   . Foot drop, bilateral   . Pneumonia of both lower lobes due to methicillin resistant Staphylococcus aureus (MRSA) ([place])   . Opioid use with withdrawal ([place])   . Acute respiratory failure with hypoxia ([place])   . Encounter for central line placement   . Encounter for orogastric (OG) tube placement   . Endotracheally intubated   . Acute respiratory failure with hypoxemia ([place])   . Sepsis ([place])   . UTI (lower urinary tract infection)   . Osteomyelitis ([place])   . Pressure ulcer  11/10/2015  . Septic shock ([place]) 11/07/2015  . Altered mental status   . MRSA bacteremia 10/03/2015  . COPD (chronic obstructive pulmonary disease) ([place]) 10/03/2015  . 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 Osteomyelitis of foot, left, acute ([place]) 10/03/2015  . Lumbar discitis 10/03/2015  . Pneumococcal pneumonia ([place]) 03/25/2012  . Diabetes ([place]) 03/25/2012    Orientation RESPIRATION BLADDER Height & Weight     Self, Time, Situation, Place  Normal Continent, Indwelling catheter Weight:  (unable to weight pt/pt unable to stand/air bed no scale) Height:  6\' 1"  (185.4 cm)  BEHAVIORAL SYMPTOMS/MOOD NEUROLOGICAL BOWEL NUTRITION STATUS   (None)  (None) Continent Diet (Heart-healthy/carb-modified)  AMBULATORY STATUS COMMUNICATION OF NEEDS Skin   Extensive Assist Verbally PU Stage and Appropriate Care, Surgical wounds (Open/Dehisced right great toe) PU Stage 1 Dressing:  (Foam prn buttocks)                     Personal Care Assistance Level of Assistance  Bathing, Feeding, Dressing Bathing Assistance: Limited assistance Feeding assistance: Independent Dressing Assistance: Limited assistance     Functional Limitations Info  Sight, Hearing, Speech Sight Info: Adequate Hearing Info: Adequate Speech Info: Adequate    SPECIAL CARE FACTORS FREQUENCY  PT (By licensed PT), Diabetic urine testing, Speech therapy     PT Frequency: 5 x week       Speech Therapy Frequency: 5 x week  Contractures Contractures Info: Not present    Additional Factors Info  Code Status, Allergies, Isolation Precautions Code Status Info: Full Allergies Info: NKDA     Isolation Precautions Info: Contact: MRSA     Current Medications (11/21/2015):  This is the current hospital active medication list Current Facility-Administered Medications  Medication Dose Route Frequency Provider Last Rate Last Dose  . 0.9 %  sodium chloride infusion   Intravenous Continuous Cherene Altes, MD       . acetaminophen (TYLENOL) solution 650 mg  650 mg Oral Q6H PRN Cherene Altes, MD      . antiseptic oral rinse solution (CORINZ)  7 mL Mouth Rinse PRN Raylene Miyamoto, MD      . collagenase (SANTYL) ointment   Topical Daily Javier Glazier, MD      . diazepam (VALIUM) injection 5-10 mg  5-10 mg Intravenous Q4H PRN Cherene Altes, MD      . docusate sodium (COLACE) capsule 100 mg  100 mg Oral BID Cherene Altes, MD   100 mg at 11/21/15 1225  . feeding supplement (ENSURE ENLIVE) (ENSURE ENLIVE) liquid 237 mL  237 mL Oral BID BM Allie Bossier, MD   237 mL at 11/20/15 1000  . furosemide (LASIX) injection 20 mg  20 mg Intravenous Daily Raylene Miyamoto, MD   20 mg at 11/21/15 0810  . haloperidol lactate (HALDOL) injection 2 mg  2 mg Intravenous BID Cherene Altes, MD      . heparin injection 5,000 Units  5,000 Units Subcutaneous Q8H Javier Glazier, MD   5,000 Units at 11/21/15 1225  . hydrALAZINE (APRESOLINE) injection 10 mg  10 mg Intravenous Q4H PRN Javier Glazier, MD   10 mg at 11/14/15 1318  . insulin aspart (novoLOG) injection 0-20 Units  0-20 Units Subcutaneous TID WC Cherene Altes, MD      . insulin aspart (novoLOG) injection 0-5 Units  0-5 Units Subcutaneous QHS Cherene Altes, MD      . insulin glargine (LANTUS) injection 15 Units  15 Units Subcutaneous QHS Allie Bossier, MD   15 Units at 11/20/15 2227  . ipratropium-albuterol (DUONEB) 0.5-2.5 (3) MG/3ML nebulizer solution 3 mL  3 mL Nebulization Q2H PRN Chesley Mires, MD      . magnesium oxide (MAG-OX) tablet 400 mg  400 mg Oral BID Cherene Altes, MD   400 mg at 11/21/15 1225  . oxyCODONE (OXYCONTIN) 12 hr tablet 90 mg  90 mg Oral Q12H Allie Bossier, MD   90 mg at 11/21/15 0810  . pantoprazole (PROTONIX) EC tablet 40 mg  40 mg Oral Daily Raylene Miyamoto, MD   40 mg at 11/21/15 0810  . PARoxetine (PAXIL) tablet 40 mg  40 mg Per Tube Daily Erick Colace, NP   40 mg at 11/21/15 0810  . polyethylene  glycol (MIRALAX / GLYCOLAX) packet 17 g  17 g Oral Daily PRN Cherene Altes, MD      . pregabalin (LYRICA) capsule 150 mg  150 mg Oral Q8H Erick Colace, NP   150 mg at 11/21/15 1225  . senna (SENOKOT) tablet 8.6 mg  1 tablet Oral BID Raylene Miyamoto, MD   8.6 mg at 11/21/15 [id number]  . tamsulosin (FLOMAX) capsule 0.4 mg  0.4 mg Oral Daily Chesley Mires, MD   0.4 mg at 11/21/15 0810  . vancomycin (VANCOCIN) IVPB 750 mg/150 ml premix  750 mg Intravenous Q12H  Wynell Balloon, RPH   750 mg at 11/21/15 [id number]     Discharge Medications: Please see discharge summary for a list of discharge medications.  Relevant Imaging Results:  Relevant Lab Results:   Additional Information SS#: [id number]  Candie Chroman, LCSW

## 2015-11-21 NOTE — Clinical Social Work Note (Signed)
Clinical Social Work Assessment  Patient Details  Name: Gregory [name] MRN: [id number] Date of Birth: [birth year]  Date of referral:  11/21/15               Reason for consult:  Facility Placement, Discharge Planning                Permission sought to share information with:    Permission granted to share information::  No  Name::        Agency::     Relationship::     Contact Information:     Housing/Transportation Living arrangements for the past 2 months:  [place] of Information:  Patient, Medical Team Patient Interpreter Needed:  None Criminal Activity/Legal Involvement Pertinent to Current Situation/Hospitalization:  No - Comment as needed Significant Relationships:  Adult Children Lives with:  Facility Resident Do you feel safe going back to the place where you live?  Yes Need for family participation in patient care:  Yes (Comment)  Care giving concerns:  PT recommending SNF at discharge.   Social Worker assessment / plan:  CSW met with patient. No supports at bedside. CSW introduced role and explained that discharge planning would be discussed. Confirmed that patient is from [facility] Patient has been there the past 30 days. He is unsure whether he will be returning. He has been in contact with his daughter and may return home with her. Patient states that he needs neck and back surgery. Patient said not to call his daughter right now. No further concerns. CSW encouraged patient to contact CSW as needed. CSW will continue to follow patient for support and facilitate discharge to SNF if that is what he and his daughter decide on.  Employment status:  [employer] information:  Self Pay (Medicaid Pending) PT Recommendations:  [place] / Referral to community resources:  [place]  Patient/Family's Response to care:  Patient unsure whether he is agreeable to return to [facility] [employer]. Patient's daughter  supportive and involved in patient's care. Patient polite and appreciated social work intervention.  Patient/Family's Understanding of and Emotional Response to Diagnosis, Current Treatment, and Prognosis:  Patient knowledgeable of medical interventions and aware of possible discharge to SNF once medically stable. Patient aware and accepting that he needs neck and back surgery.  Emotional Assessment Appearance:  Appears stated age Attitude/Demeanor/Rapport:   (Pleasant) Affect (typically observed):  Appropriate, Calm, Pleasant Orientation:  Oriented to Self, Oriented to Place, Oriented to  Time, Oriented to Situation Alcohol / Substance use:  Illicit Drugs Psych involvement (Current and /or in the community):  No (Comment)  Discharge Needs  Concerns to be addressed:  Care Coordination Readmission within the last 30 days:  No Current discharge risk:  Dependent with Mobility, Substance Abuse Barriers to Discharge:  Inadequate or no insurance   Candie Chroman, LCSW 11/21/2015, 2:42 PM

## 2015-11-21 NOTE — Progress Notes (Signed)
Speech Language Pathology Treatment: Dysphagia  Patient Details Name: Gregory [name] MRN: [id number] DOB: July 23, 1960 Today's Date: 11/21/2015 Time: 0263-7858 SLP Time Calculation (min) (ACUTE ONLY): 16 min  Assessment / Plan / Recommendation Clinical Impression  Pt consumed regular consistency, as well as, mixed consistency during PO intake of thin/regular without any overt s/s of aspiration noted during entire snack; nursing stated he is consuming medications without difficulty as well; vocal quality good during intake; pt denies any dysphagia; recommend upgrade to regular/thin liquids; ST will s/o services as well.   HPI HPI: 55 yo male brought to ER with altered mental status. He resides at [facility]. He was hypoxic, diaphoretic. He was given narcan and then became agitated. He was given ativan, and became sedated. He was noted to have fever, low BP. He was intubated for airway protection. He had purulent appearing urine. He was started on antibiotics, IV fluids, and pressors; BSE completed on 11/17/15 indicated Dysphagia 3/thin liquids.      SLP Plan  All goals met;Discharge SLP treatment due to (comment)     Recommendations  Diet recommendations: Regular;Thin liquid Liquids provided via: Cup;Straw Medication Administration: Whole meds with liquid Supervision: Patient able to self feed Compensations: Slow rate;Small sips/bites Postural Changes and/or Swallow Maneuvers: Seated upright 90 degrees             Oral Care Recommendations: Oral care BID Follow up Recommendations: None Plan: All goals met;Discharge SLP treatment due to (comment)                     ADAMS,PAT, M.S., CCC-SLP 11/21/2015, 10:41 AM

## 2015-11-22 DIAGNOSIS — M4640 Discitis, unspecified, site unspecified: Secondary | ICD-10-CM

## 2015-11-22 LAB — GLUCOSE, CAPILLARY
GLUCOSE-CAPILLARY: 133 mg/dL — AB (ref 65–99)
Glucose-Capillary: 152 mg/dL — ABNORMAL HIGH (ref 65–99)
Glucose-Capillary: 192 mg/dL — ABNORMAL HIGH (ref 65–99)
Glucose-Capillary: 167 mg/dL — ABNORMAL HIGH (ref 65–99)

## 2015-11-22 LAB — MAGNESIUM: MAGNESIUM: 2 mg/dL (ref 1.7–2.4)

## 2015-11-22 LAB — CBC
HCT: 33.4 % — ABNORMAL LOW (ref 39.0–52.0)
HEMOGLOBIN: 10.1 g/dL — AB (ref 13.0–17.0)
MCH: 26.3 pg (ref 26.0–34.0)
MCHC: 30.2 g/dL (ref 30.0–36.0)
MCV: 87 fL (ref 78.0–100.0)
PLATELETS: 262 10*3/uL (ref 150–400)
RBC: 3.84 MIL/uL — AB (ref 4.22–5.81)
RDW: 16.6 % — ABNORMAL HIGH (ref 11.5–15.5)
WBC: 8.7 10*3/uL (ref 4.0–10.5)

## 2015-11-22 LAB — RENAL FUNCTION PANEL
ALBUMIN: 2.8 g/dL — AB (ref 3.5–5.0)
Anion gap: 10 (ref 5–15)
BUN: 13 mg/dL (ref 6–20)
CHLORIDE: 98 mmol/L — AB (ref 101–111)
CO2: 28 mmol/L (ref 22–32)
CREATININE: 1.11 mg/dL (ref 0.61–1.24)
Calcium: 9.7 mg/dL (ref 8.9–10.3)
GFR calc Af Amer: >60 mL/min (ref >60)
GFR calc non Af Amer: >60 mL/min (ref >60)
GLUCOSE: 152 mg/dL — AB (ref 65–99)
POTASSIUM: 4.2 mmol/L (ref 3.5–5.1)
Phosphorus: 5 mg/dL — ABNORMAL HIGH (ref 2.5–4.6)
Sodium: 136 mmol/L (ref 135–145)

## 2015-11-22 LAB — VANCOMYCIN, TROUGH: VANCOMYCIN TR: 25 ug/mL — AB (ref 10.0–20.0)

## 2015-11-22 MED ORDER — SODIUM CHLORIDE 0.9% FLUSH
10.0000 mL | Freq: Two times a day (BID) | INTRAVENOUS | Status: DC
  Administered 2015-11-22 – 2015-12-02 (×12): 10 mL

## 2015-11-22 MED ORDER — OXYCODONE HCL 5 MG PO TABS: 5.0000 mg | ORAL_TABLET | Freq: Four times a day (QID) | ORAL | Status: DC | PRN

## 2015-11-22 MED ORDER — POLYETHYLENE GLYCOL 3350 17 G PO PACK
17.0000 g | PACK | Freq: Every day | ORAL | Status: DC
  Administered 2015-11-23: 17 g via ORAL
  Filled 2015-11-22: qty 1

## 2015-11-22 MED ORDER — ENSURE ENLIVE PO LIQD
237.0000 mL | Freq: Three times a day (TID) | ORAL | Status: DC
  Administered 2015-11-23 – 2015-11-27 (×7): 237 mL via ORAL
  Filled 2015-11-22 (×7): qty 237

## 2015-11-22 MED ORDER — OXYCODONE HCL ER 20 MG PO T12A
80.0000 mg | EXTENDED_RELEASE_TABLET | Freq: Two times a day (BID) | ORAL | Status: DC
  Administered 2015-11-22 – 2015-11-27 (×10): 80 mg via ORAL
  Filled 2015-11-22: qty 8
  Filled 2015-11-22: qty 2
  Filled 2015-11-22: qty 4
  Filled 2015-11-22 (×2): qty 2
  Filled 2015-11-22: qty 4
  Filled 2015-11-22: qty 8
  Filled 2015-11-22 (×3): qty 4

## 2015-11-22 MED ORDER — DIAZEPAM 5 MG PO TABS
5.0000 mg | ORAL_TABLET | Freq: Four times a day (QID) | ORAL | Status: DC | PRN
  Administered 2015-11-22 – 2015-11-23 (×4): 5 mg via ORAL
  Filled 2015-11-22 (×4): qty 1

## 2015-11-22 MED ORDER — SODIUM CHLORIDE 0.9% FLUSH
10.0000 mL | INTRAVENOUS | Status: DC | PRN
  Administered 2015-11-28 – 2015-12-02 (×2): 10 mL
  Filled 2015-11-22 (×2): qty 40

## 2015-11-22 NOTE — Progress Notes (Signed)
PT Cancellation Note  Patient Details Name: Gregory [name] MRN: [id number] DOB: 04-Dec-1960   Cancelled Treatment:    Reason Eval/Treat Not Completed: Other (comment) (spoke with NS MD, will hold PT until after sx later this wk). Will sign off and request new orders post procedure. Thank you.   Duncan Dull 11/22/2015, 10:15 AM Alben Deeds, PT DPT  [phone]

## 2015-11-22 NOTE — Progress Notes (Signed)
No issues overnight. Pt has no new complaints today.  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 Needs circumferential decompression/stabilization.  PLAN: - We have scheduled staged surgery, Friday afternoon for C5, C6 corpectomy and Tuesday afternoon for C3-7 laminectomy and lateral mass fusion.  I have reviewed the MRI images and findings with the patient and his daughter who is an Therapist, sports at [employer]. The indications for surgery were discussed in detail. We reviewed risks of surgery. Expected postop course and recovery were discussed. All questions were answered.

## 2015-11-22 NOTE — Progress Notes (Signed)
PROGRESS NOTE    Gregory [name]  [name] DOB: [birth year] DOA: 11/07/2015 PCP: Tamsen Roers, MD   Brief Narrative:  56 yo WM PMHx Diabetes mellitus without complication , HTN, COPD (not on home O2), DDD (degenerative disc disease), lumbar Osteomyelitis, Chronic Pain Syndrome (lower back),Chronic Urinary retention. The following history obtained from [place] [facility] RN) C-spine osteomyelitis and L-spine abscess times ~4 months. Initially seen at [facility] where surgery was contemplated. Daughter [name] why surgery not undertaken but decided long-term antibiotics. Patient received 2 months IV antibiotics (completed course 1 May), was then discharged to [facility].  Brought to ER with altered mental status. He resides at [facility]. He was hypoxic, diaphoretic. He was given Narcan and then became agitated. He was given ativan, and became sedated. He was noted to have fever, low BP. He was intubated for airway protection. He had purulent appearing urine. He was started on antibiotics, IV fluids, and pressors.  He was tx for MRSA bacteremia in February 2017 and multifocal osteomyelitis of Lt foot, Lt shoulder, and spine.  @@54  yo male from NH with acute encephalopathy 2nd to septic shock from UTI, AKI, VDRF, and chronic opiate use. He has hx of MRSA bacteremia with diffuse areas of osteomyelitis in February 2017. Admitted with E coli UTI. Patient continuing on vancomycin for osteomyelitis/discitis. Now has MRSA pneumonia/tracheobronchitis. Continuing vancomycin. ID following. Delirium seems to slowly be improving on Seroquel & Haldol IV, able to wean off methadone, precedex minimal. Restarted home chronic pain meds, and home pregabalin and paxil. Much less agitation this AM.  Assessment & Plan:   Active Problems:   Septic shock (HCC)   Altered mental status   Pressure ulcer   Acute respiratory failure with hypoxemia (HCC)   Sepsis (HCC)   UTI (lower urinary tract  infection)   Osteomyelitis (HCC)   Encounter for central line placement   Encounter for orogastric (OG) tube placement   Endotracheally intubated   Osteomyelitis of cervical spine (HCC)   Cervical discitis   Sepsis due to methicillin resistant Staphylococcus aureus (MRSA) ([place])   CAP (community acquired pneumonia) due to MRSA (methicillin resistant Staphylococcus aureus) ([place])   Escherichia coli urinary tract infection   Chronic diastolic CHF (congestive heart failure) (HCC)   Pulmonary hypertension (HCC)   Essential hypertension   HLD (hyperlipidemia)   Acute renal failure (HCC)   Urinary retention   Uncontrolled type 2 diabetes mellitus with complication (HCC)   Discitis of cervical region   Synovial cyst of lumbar spine   Weakness of both lower extremities   Foot drop, bilateral   Pneumonia of both lower lobes due to methicillin resistant Staphylococcus aureus (MRSA) (HCC)   Opioid use with withdrawal (HCC)   Acute respiratory failure with hypoxia (HCC)   Abscess in epidural space of lumbar spine  C5-6 Discitis/positive MRSA Osteomyelitis Cervical spine w/ Trace Epidural Abscess/L4-5 Foraminal Cyst vs abscess -Vancomycin 56 days per ID  -Air mattress overlay; ensure on rotation at all times -Spoke with Dr. Kristeen Miss Neurosurgery; concerned that patient was able to ambulate in January and now has bilateral lower extremity weakness with footdrop. -Per neurosurgery 6/6 scheduled staged surgery, Friday afternoon for C5, C6 corpectomy and Tuesday afternoon for C3-7 laminectomy and lateral mass fusion.  Bilateral lower extremity weakness/bilateral foot drop -L-spine cyst/abscess with severe canal stenosis and disc Bulge L4-5 -Unknown when neurosurgery plans to schedule surgery  Sepsis/positive MRSA Pneumonia/Tracheobronchitis -Completed course antibiotics  E coli UTI -Completed course  of antibiotics  Positive MRSA bacteremia -Completed course of antibiotics -6/6 Blood  culture negative PICC line placed -Will require TEE  Pain management/Neuropathy/withdrawal from high-dose opioids (oxycodone= 240 mg daily+ Zanaflex) -The following pain medication was abruptly DCed one patient was transferred to SDU;Versed drip 5/31,Fentanyl gtt 6/1, Fentanyl IV injection  5/31, Precedex 6/1, Methadone  5/31,Dilaudid  6/1,Seroquel bid 6/1. Patient was then only restarted on OxyContin 90 mg BID(NOTE patient was on up to 240 mg daily of OxyContin/oxycodone+ Zanaflex at home) -6/6 Decrease OxyContin 80 mg BID (patient reported as being extremely drowsy during the day) -OxyIR 5 mg QID PRN breakthrough pain -Lyrica 150 mg TID -Haldol 5 mg BID -Diazepam QID PRN  Acute Hypoxic Respiratory Failure/COPD -Most likely multifactorial to include MRSA pneumonia, COPD exacerbation, and overdose on narcotics, required Narcan in ED.   Chronic Diastolic CHF -Echocardiogram; mild diastolic dysfunction see results below -Strict in and out since admission -6.1 L -Daily weight Filed Weights   11/19/15 0500 11/20/15 0431  Weight: 100.245 kg (221 lb) 101.152 kg (223 lb)  -EKG on 6/3 -Transfuse for hemoglobin<8   HTN  -Much better controlled after obtaining adequate pain control.  HLD   Acute Renal Failure( Baseline Creatinine 0.98) Lab Results  Component Value Date   CREATININE 1.11 11/22/2015   CREATININE 1.08 11/21/2015   CREATININE 1.07 11/20/2015  . Chronic Urinary Retention -Most likely secondary to L-spine abscess, and bladder damage secondary urinary retention. Has seen urologist per daughter who has confirmed bladder damage  Anemia - No signs of active bleeding. -5/24 transfuse 1u PRBC  Recent Labs Lab 11/18/15 0358 11/19/15 0406 11/20/15 0409 11/21/15 0426 11/22/15 0413  HGB 9.3* 9.7* 9.0* 9.1* 10.1*    DM type II uncontrolled with complications -[id number] hemoglobin A1c= 7.2 -Lantus 15 units daily -Resistant SSI  Hypokalemia -Potassium goal>  4  Hypomagnesemia -Magnesium goal>2  Hyperphosphatemia -Only slightly elevated monitor     DVT prophylaxis: Subcutaneous heparin Code Status: Full Family Communication: None available  Disposition Plan: C-spine surgery Friday and next Tuesday   Consultants:  Dr.Robert W Comer infectious disease Dr Consuella Lose. Neurosurgery   Procedures/Significant Events:  5/22 CT head: mild small vessel ischemic changes 5/24 Port CXR: ETT & L IJ CVL in good position. No new focal opacity. 5/24 Port Renal U/S: No mass or hydronephrosis. 5/25 CT Abd/Pelvis: Splenomegaly noted. Inflammatory changes adjacent bladder. 5/26 MRI Spine: C5-6 discitis osteomyelitis w/ trace epidural abscess.  - Grade 1 L4-5 anterolisthesis-- bright STIR signal and enhancingbone marrow signal within the RIGHT L4-5 facet. -Congenital canal narrowing moderate L4-5 and mild L5-S1 disc height loss-- mild desiccation.-- Mild acute on chronic discogenic endplate changes at 075-GRM. -Central displacement of the cauda equina due to canal stenosis.  -L3-4: Large 5 mm broad-based disc bulge. -Moderate facet arthropathy and ligamentum flavum redundancy. -Severe canal stenosis. -Moderate to severe bilateral neural foraminal narrowing. -L4-5: Anterolisthesis. Moderate broad-based disc bulge. Severe facet arthropathy and ligamentum flavum redundancy -Severe canal stenosis. Severe RIGHT greater than LEFT neural foraminal narrowing. Abnormal signal within the RIGHT L4-5 lateral recess extend toward the RIGHT L5-S1 neural foramen, contiguous with the RIGHT facet with superimposed enhancement.  -6 mm RIGHT facet synovial cyst, less likely abscess.  -L5-S1: Moderate RIGHT, mild LEFT neural foraminal narrowing. 5/26 R Foot X-ray: Soft tissue ulcer at tip of the first distal phalanx. No evidence of osteomyelitis and right foot. 6/4 echocardiogram;- LVEF= 55% to 60%.-- (grade 1 diastolic dysfunction). - Pericardium, extracardiac: A  small pericardial effusion  Cultures 5/22 Blood: Negative  5/22 Urine:Positive E coli 5/23 Blood: Negative  5/23 Urine: Negative 5/25 Blood >> NGTD 5/25 Urine: Negative 5/26 Trach Asp: Positive MRSA 6/3 blood pending 6/3 urine pending   Antimicrobials: Zosyn 5/22>>>off Cefepime 5/22 - 5/25 Rocephin 5/25 - 5/26 Vancomycin 5/22 - 5/24; 5/26>>   Devices    LINES / TUBES:  OETT 7.5 5/22 - 5/27; 5/27 >> L IJ CVL 5/22 >>fever 6/1 OGT 5/22 >> DCed? Foley 5/22 >> Midline PICC 6/6>>     Continuous Infusions: . sodium chloride       Subjective: 6/6  A/O 4, Patient resting comfortably in bed. States pain much better controlled, However wears off during the middle of the day. Unsure if more case of anxiety vs pain.    Objective: Filed Vitals:   11/22/15 0245 11/22/15 0250 11/22/15 0738 11/22/15 0740  BP:  113/85 145/81   Pulse:  90 94   Temp: 97.5 F (36.4 C)   98.4 F (36.9 C)  TempSrc: Oral   Oral  Resp:  11 16   Height:      Weight:      SpO2:  92% 93%     Intake/Output Summary (Last 24 hours) at 11/22/15 1034 Last data filed at 11/22/15 1002  Gross per 24 hour  Intake   1860 ml  Output   6350 ml  Net  -4490 ml   Filed Weights   11/19/15 0500 11/20/15 0431  Weight: 100.245 kg (221 lb) 101.152 kg (223 lb)    Examination:  General:  A/O 4, Pain is controlled,, No acute respiratory distress Eyes: negative scleral hemorrhage, negative anisocoria, negative icterus ENT: Negative Runny nose, negative gingival bleeding, Neck:  Negative scars, masses, torticollis, lymphadenopathy, JVD Lungs: Clear to auscultation bilaterally without wheezes or crackles Cardiovascular: Tachycardic, Regular rhythm without murmur gallop or rub normal S1 and S2 Abdomen: negative abdominal pain, nondistended, positive soft, bowel sounds, no rebound, no ascites, no appreciable mass Extremities: patient has bilateral foot drop Rt>>Lt, ulcer on the right first  metatarsal partially healed negative discharge Clean and covered.   Skin: Rosacea?  Psychiatric:  Negative depression, negative anxiety, negative fatigue, opioid withdrawal  SSx under control.   Neurologic:  Cranial nerves II through XII intact, tongue/uvula midline, all extremities muscle strength 5/5,Bilateral foot drop Rt>>Lt (unable to dorsiflex right foot at all), negative dysarthria, negative expressive aphasia, negative receptive aphasia.   .     Data Reviewed: Care during the described time interval was provided by me .  I have reviewed this patient's available data, including medical history, events of note, physical examination, and all test results as part of my evaluation. I have personally reviewed and interpreted all radiology studies.  CBC:  Recent Labs Lab 11/16/15 0400 11/18/15 0358 11/19/15 0406 11/20/15 0409 11/21/15 0426 11/22/15 0413  WBC 7.3 9.6 9.1 7.8 7.5 8.7  NEUTROABS 4.7 6.2 5.9 4.6 4.5  --   HGB 8.3* 9.3* 9.7* 9.0* 9.1* 10.1*  HCT 28.5* 31.4* 32.4* 30.2* 31.5* 33.4*  MCV 88.0 86.5 85.9 86.3 87.3 87.0  PLT 359 298 324 251 280 [id number]   Basic Metabolic Panel:  Recent Labs Lab 11/18/15 0358 11/19/15 0406 11/20/15 0409 11/21/15 0426 11/22/15 0413  NA 138 138 139 138 136  K 3.5 3.4* 3.4* 4.0 4.2  CL 94* 94* 98* 98* 98*  CO2 32 32 30 31 28   GLUCOSE 117* 101* 103* 115* 152*  BUN 15 11 10 11 13   CREATININE 1.17 1.06  1.07 1.08 1.11  CALCIUM 9.5 9.6 9.4 9.2 9.7  MG 2.0 1.9 1.9 2.1 2.0  PHOS 5.3* 4.8* 4.9* 4.9* 5.0*   GFR: Estimated Creatinine Clearance: 92.9 mL/min (by C-G formula based on Cr of 1.11). Liver Function Tests:  Recent Labs Lab 11/18/15 0358 11/19/15 0406 11/20/15 0409 11/21/15 0426 11/22/15 0413  AST  --  17 15 15   --   ALT  --  18 16* 15*  --   ALKPHOS  --  79 76 80  --   BILITOT  --  0.3 0.3 0.3  --   PROT  --  7.7 6.9 6.9  --   ALBUMIN 2.5* 2.6* 2.5* 2.7* 2.8*   No results for input(s): LIPASE, AMYLASE in the last 168  hours. No results for input(s): AMMONIA in the last 168 hours. Coagulation Profile:  Recent Labs Lab 11/15/15 1315  INR 1.10   Cardiac Enzymes: No results for input(s): CKTOTAL, CKMB, CKMBINDEX, TROPONINI in the last 168 hours. BNP (last 3 results) No results for input(s): PROBNP in the last 8760 hours. HbA1C: No results for input(s): HGBA1C in the last 72 hours. CBG:  Recent Labs Lab 11/21/15 0737 11/21/15 1212 11/21/15 1617 11/21/15 1921 11/21/15 2129  GLUCAP 116* 193* 137* 191* 190*   Lipid Profile:  Recent Labs  11/20/15 0409 11/21/15 0426  TRIG 236* 227*   Thyroid Function Tests: No results for input(s): TSH, T4TOTAL, FREET4, T3FREE, THYROIDAB in the last 72 hours. Anemia Panel: No results for input(s): VITAMINB12, FOLATE, FERRITIN, TIBC, IRON, RETICCTPCT in the last 72 hours. Urine analysis:    Component Value Date/Time   COLORURINE YELLOW 11/10/2015 1720   APPEARANCEUR HAZY* 11/10/2015 1720   LABSPEC 1.014 11/10/2015 1720   PHURINE 6.0 11/10/2015 1720   GLUCOSEU NEGATIVE 11/10/2015 1720   HGBUR SMALL* 11/10/2015 1720   BILIRUBINUR NEGATIVE 11/10/2015 1720   KETONESUR 15* 11/10/2015 1720   PROTEINUR 100* 11/10/2015 1720   UROBILINOGEN 1.0 02/03/2014 0052   NITRITE NEGATIVE 11/10/2015 1720   LEUKOCYTESUR SMALL* 11/10/2015 1720   Sepsis Labs: @LABRCNTIP (procalcitonin:4,lacticidven:4)  ) Recent Results (from the past 240 hour(s))  Culture, blood (Routine X 2) w Reflex to ID Panel     Status: None (Preliminary result)   Collection Time: 11/19/15 11:35 AM  Result Value Ref Range Status   Specimen Description BLOOD RIGHT ANTECUBITAL  Final   Special Requests BOTTLES DRAWN AEROBIC AND ANAEROBIC 10CC  Final   Culture NO GROWTH 2 DAYS  Final   Report Status PENDING  Incomplete  Culture, blood (Routine X 2) w Reflex to ID Panel     Status: None (Preliminary result)   Collection Time: 11/19/15 11:39 AM  Result Value Ref Range Status   Specimen  Description BLOOD RIGHT ANTECUBITAL  Final   Special Requests IN PEDIATRIC BOTTLE  0.5CC  Final   Culture NO GROWTH 2 DAYS  Final   Report Status PENDING  Incomplete  Culture, Urine     Status: None   Collection Time: 11/19/15  1:21 PM  Result Value Ref Range Status   Specimen Description URINE, RANDOM  Final   Special Requests NONE  Final   Culture NO GROWTH  Final   Report Status 11/20/2015 FINAL  Final         Radiology Studies: No results found.      Scheduled Meds: . collagenase   Topical Daily  . docusate sodium  100 mg Oral BID  . feeding supplement (ENSURE ENLIVE)  237 mL Oral  BID BM  . furosemide  20 mg Intravenous Daily  . haloperidol lactate  2 mg Intravenous BID  . heparin subcutaneous  5,000 Units Subcutaneous Q8H  . insulin aspart  0-20 Units Subcutaneous TID WC  . insulin aspart  0-5 Units Subcutaneous QHS  . insulin glargine  15 Units Subcutaneous QHS  . magnesium oxide  400 mg Oral BID  . oxyCODONE  90 mg Oral Q12H  . pantoprazole  40 mg Oral Daily  . PARoxetine  40 mg Per Tube Daily  . pregabalin  150 mg Oral Q8H  . senna  1 tablet Oral BID  . tamsulosin  0.4 mg Oral Daily  . vancomycin  750 mg Intravenous Q12H   Continuous Infusions: . sodium chloride       LOS: 15 days    Time spent: 40 minutes    Elidia Bonenfant, Geraldo Docker, MD [facility] Pager [phone]   If 7PM-7AM, please contact night-coverage [email or web address] Password [email or web address] 11/22/2015, 10:34 AM

## 2015-11-22 NOTE — Progress Notes (Signed)
Pharmacy Antibiotic Note  Gregory [name] is a 55 y.o. male admitted on 11/07/2015 with disseminated MRSA infection including C5-6 discitis/osteomyelitis and pneumonia. He continues on day # 15/56 vancomycin. Vancomycin trough today is above goal at 25, but drawn 2 hours and 45 minutes early so true trough is actually ~ 20 which is at goal. Renal function stable.  Goal vancomycin trough 15-20  Plan: 1) Continue vancomycin 750mg  IV q12 2) Weekly trough, or sooner if change in renal function  Height: 6\' 1"  (185.4 cm) Weight:  (unable to weight pt/pt unable to stand/air bed no scale) IBW/kg (Calculated) : 79.9  Temp (24hrs), Avg:98 F (36.7 C), Min:97.4 F (36.3 C), Max:98.4 F (36.9 C)   Recent Labs Lab 11/18/15 0358 11/19/15 0406 11/20/15 0409 11/21/15 0426 11/22/15 0413 11/22/15 0714  WBC 9.6 9.1 7.8 7.5 8.7  --   CREATININE 1.17 1.06 1.07 1.08 1.11  --   VANCOTROUGH  --   --   --   --   --  25*    Estimated Creatinine Clearance: 92.9 mL/min (by C-G formula based on Cr of 1.11).    No Known Allergies  Antimicrobials this admission: 5/22 Vanc >>5/24; 5/26>> 5/22 Cefepime >>5/24 5/24 Ceftriaxone>>5/26  Dose adjustments this admission: - 5/30 VT 15 on 750 q12h  - 6/6 VT 25 (but drawn 2 hours 45 mins early) so true trough is actually ~ 20  Microbiology results: 5/22 Blood x2>> negF 5/22 UCx >> 100k ecol MRSA PCR Pos 5/23 Urine >> negF  5/23 Blood x2 >> negF 5/25 Blood x2 >> ngtd  5/25 Urine >> negF 5/26 tracheal - abundant MRSA 6/3 Blood x2 >> 6/3 Urine >>  Thank you for allowing pharmacy to be a part of this patient's care.  Deboraha Sprang 11/22/2015 11:07 AM

## 2015-11-22 NOTE — Progress Notes (Signed)
Peripherally Inserted Central Catheter/Midline Placement  The IV Nurse has discussed with the patient and/or persons authorized to consent for the patient, the purpose of this procedure and the potential benefits and risks involved with this procedure.  The benefits include less needle sticks, lab draws from the catheter and patient may be discharged home with the catheter.  Risks include, but not limited to, infection, bleeding, blood clot (thrombus formation), and puncture of an artery; nerve damage and irregular heat beat.  Alternatives to this procedure were also discussed.  PICC/Midline Placement Documentation  PICC Single Lumen [id number] PICC Right Basilic 44 cm 2 cm (Active)  Indication for Insertion or Continuance of Line Home intravenous therapies (PICC only) 11/22/2015 12:00 PM  Exposed Catheter (cm) 2 cm 11/22/2015 12:00 PM  Dressing Change Due 11/29/15 11/22/2015 12:00 PM       Jule Economy Horton 11/22/2015, 12:07 PM

## 2015-11-22 NOTE — Progress Notes (Signed)
    [facility]   Reason for visit: Follow up on discitis  Interval History: plan for surgery on Friday then on Tuesday.  Afebrile, no pain.    Physical Exam: Constitutional:  Filed Vitals:   11/22/15 0738 11/22/15 0740  BP: 145/81   Pulse: 94   Temp:  98.4 F (36.9 C)  Resp: 16    patient appears in NAD Respiratory: Normal respiratory effort; CTA B Cardiovascular: RRR GI: soft, nt  Review of Systems: Constitutional: negative for fevers and chills Gastrointestinal: negative for diarrhea Musculoskeletal: negative for myalgias and arthralgias  Lab Results  Component Value Date   WBC 8.7 11/22/2015   HGB 10.1* 11/22/2015   HCT 33.4* 11/22/2015   MCV 87.0 11/22/2015   PLT 262 11/22/2015    Lab Results  Component Value Date   CREATININE 1.11 11/22/2015   BUN 13 11/22/2015   NA 136 11/22/2015   K 4.2 11/22/2015   CL 98* 11/22/2015   CO2 28 11/22/2015    Lab Results  Component Value Date   ALT 15* 11/21/2015   AST 15 11/21/2015   ALKPHOS 80 11/21/2015     Microbiology: Recent Results (from the past 240 hour(s))  Culture, blood (Routine X 2) w Reflex to ID Panel     Status: None (Preliminary result)   Collection Time: 11/19/15 11:35 AM  Result Value Ref Range Status   Specimen Description BLOOD RIGHT ANTECUBITAL  Final   Special Requests BOTTLES DRAWN AEROBIC AND ANAEROBIC 10CC  Final   Culture NO GROWTH 2 DAYS  Final   Report Status PENDING  Incomplete  Culture, blood (Routine X 2) w Reflex to ID Panel     Status: None (Preliminary result)   Collection Time: 11/19/15 11:39 AM  Result Value Ref Range Status   Specimen Description BLOOD RIGHT ANTECUBITAL  Final   Special Requests IN PEDIATRIC BOTTLE  0.5CC  Final   Culture NO GROWTH 2 DAYS  Final   Report Status PENDING  Incomplete  Culture, Urine     Status: None   Collection Time: 11/19/15  1:21 PM  Result Value Ref Range Status   Specimen Description URINE, RANDOM  Final   Special Requests NONE  Final   Culture NO GROWTH  Final   Report Status 11/20/2015 FINAL  Final    Impression/Plan:  1. Discitis - day 16/56 of vancomycin.  Surgery planned for Friday and Tuesday.   2.  MRSA pneumonia - resolved, no current sob.   Will continue to follow but intermittently

## 2015-11-22 NOTE — Care Management Note (Signed)
Case Management Note  Patient Details  Name: Gregory [name] MRN: [id number] Date of Birth: 05-24-1961  Subjective/Objective:     lumbar Osteomyelitis             Action/Plan: Discharge Planning: Chart reviewed. Scheduled dc to SNF-when medically stable.   PCP Tamsen Roers MD  Expected Discharge Date:                Expected Discharge Plan:  [place] (from [facility])  In-House Referral:  Clinical Social Work  Discharge planning Services  CM Consult  Post Acute Care Choice:  NA Choice offered to:  NA  DME Arranged:  N/A DME Agency:  NA  HH Arranged:  NA HH Agency:  NA  Status of Service:  Completed, signed off  Medicare Important Message Given:    Date Medicare IM Given:    Medicare IM give by:    Date Additional Medicare IM Given:    Additional Medicare Important Message give by:     If discussed at [place] of Stay Meetings, dates discussed:    Additional Comments:  Erenest Rasher, RN 11/22/2015, 2:08 PM

## 2015-11-22 NOTE — Progress Notes (Signed)
Nutrition Follow-up  DOCUMENTATION CODES:   Obesity unspecified  INTERVENTION:  Ensure Enlive TID. Each supplement provides 350 kcals and 20 grams of protein.   NUTRITION DIAGNOSIS:   Increased nutrient needs related to wound healing (osteomyelitis, surgery ) as evidenced by estimated needs.  Ongoing  GOAL:   Patient will meet greater than or equal to 90% of their needs  Progressing   MONITOR:   PO intake, Supplement acceptance, Labs, Weight trends, Skin, I & O's  ASSESSMENT:   55 yo male from NH with acute encephalopathy 2nd to septic shock from UTI, AKI, VDRF, and chronic opiate use. He has hx of MRSA bacteremia with diffuse areas of osteomyelitis in February 2017.  Planning for surgery on Friday and Tuesday for discitis.   PO's 50-75%. Diet advanced to Heart healthy diet (from D3) yesterday. Pt denies N/V, abdominal pain, and difficulty chewing/swallowing on new diet.  Pt receiving Ensure. He prefers chocolate. Will continue to provide Ensure TID.   Pt weight fluctuating, trending up since 6/1.   Labs reviewed; Cl 98, CBGs 133-243, phos 5.0 Meds reviewed; Colace, mag-ox, senekot.  Diet Order:  Diet heart healthy/carb modified Room service appropriate?: Yes; Fluid consistency:: Thin  Skin:  Wound (see comment) (Stage 1 buttocks, wound to R toe/foot)  Last BM:  unknown  Height:   Ht Readings from Last 1 Encounters:  11/18/15 6\' 1"  (1.854 m)    Weight:   Wt Readings from Last 1 Encounters:  08/30/15 230 lb (104.327 kg)    Ideal Body Weight:  80.9 kg  BMI:  Body mass index is 29.43 kg/(m^2).  Estimated Nutritional Needs:   Kcal:  2000-2200  Protein:  120-140 gm  Fluid:  2-2.2 L  EDUCATION NEEDS:   No education needs identified at this time  Gregory [name], [place] Dietetic Intern Pager [phone]

## 2015-11-23 LAB — RENAL FUNCTION PANEL
ALBUMIN: 2.9 g/dL — AB (ref 3.5–5.0)
ANION GAP: 11 (ref 5–15)
BUN: 12 mg/dL (ref 6–20)
CALCIUM: 10.1 mg/dL (ref 8.9–10.3)
CO2: 29 mmol/L (ref 22–32)
Chloride: 96 mmol/L — ABNORMAL LOW (ref 101–111)
Creatinine, Ser: 0.99 mg/dL (ref 0.61–1.24)
GFR calc Af Amer: >60 mL/min (ref >60)
GFR calc non Af Amer: >60 mL/min (ref >60)
GLUCOSE: 153 mg/dL — AB (ref 65–99)
Phosphorus: 5.2 mg/dL — ABNORMAL HIGH (ref 2.5–4.6)
Potassium: 4.4 mmol/L (ref 3.5–5.1)
SODIUM: 136 mmol/L (ref 135–145)

## 2015-11-23 LAB — GLUCOSE, CAPILLARY
GLUCOSE-CAPILLARY: 167 mg/dL — AB (ref 65–99)
GLUCOSE-CAPILLARY: 154 mg/dL — AB (ref 65–99)
Glucose-Capillary: 155 mg/dL — ABNORMAL HIGH (ref 65–99)
Glucose-Capillary: 189 mg/dL — ABNORMAL HIGH (ref 65–99)

## 2015-11-23 MED ORDER — SORBITOL 70 % SOLN
960.0000 mL | TOPICAL_OIL | Freq: Once | ORAL | Status: AC | PRN
  Administered 2015-11-23: 960 mL via RECTAL
  Filled 2015-11-23: qty 240

## 2015-11-23 MED ORDER — DIAZEPAM 5 MG PO TABS
5.0000 mg | ORAL_TABLET | Freq: Three times a day (TID) | ORAL | Status: DC | PRN
  Administered 2015-11-23 – 2015-12-02 (×17): 5 mg via ORAL
  Filled 2015-11-23 (×20): qty 1

## 2015-11-23 MED ORDER — SENNOSIDES-DOCUSATE SODIUM 8.6-50 MG PO TABS
1.0000 | ORAL_TABLET | Freq: Two times a day (BID) | ORAL | Status: DC
  Administered 2015-11-23 – 2015-12-02 (×12): 1 via ORAL
  Filled 2015-11-23 (×15): qty 1

## 2015-11-23 MED ORDER — POLYETHYLENE GLYCOL 3350 17 G PO PACK
17.0000 g | PACK | Freq: Two times a day (BID) | ORAL | Status: DC
  Administered 2015-11-23 – 2015-12-02 (×10): 17 g via ORAL
  Filled 2015-11-23 (×14): qty 1

## 2015-11-23 MED ORDER — TRAMADOL HCL 50 MG PO TABS
50.0000 mg | ORAL_TABLET | Freq: Four times a day (QID) | ORAL | Status: DC | PRN
  Administered 2015-11-26 – 2015-12-02 (×8): 50 mg via ORAL
  Filled 2015-11-23 (×8): qty 1

## 2015-11-23 MED ORDER — OXYCODONE HCL 5 MG PO TABS
5.0000 mg | ORAL_TABLET | Freq: Four times a day (QID) | ORAL | Status: DC | PRN
  Administered 2015-11-24 – 2015-12-02 (×15): 5 mg via ORAL
  Filled 2015-11-23 (×16): qty 1

## 2015-11-23 MED ORDER — SORBITOL 70 % SOLN
15.0000 mL | Freq: Two times a day (BID) | Status: DC
  Administered 2015-11-23 – 2015-12-02 (×8): 15 mL via ORAL
  Filled 2015-11-23 (×18): qty 30

## 2015-11-23 MED ORDER — HALOPERIDOL LACTATE 5 MG/ML IJ SOLN
1.0000 mg | Freq: Two times a day (BID) | INTRAMUSCULAR | Status: DC
  Administered 2015-11-23 – 2015-11-24 (×2): 1 mg via INTRAVENOUS
  Filled 2015-11-23 (×2): qty 1

## 2015-11-23 MED ORDER — BISACODYL 10 MG RE SUPP: 10.0000 mg | Freq: Every day | RECTAL | Status: DC | PRN

## 2015-11-23 MED ORDER — FUROSEMIDE 20 MG PO TABS
20.0000 mg | ORAL_TABLET | Freq: Every day | ORAL | Status: DC
  Administered 2015-11-23 – 2015-11-24 (×2): 20 mg via ORAL
  Filled 2015-11-23 (×2): qty 1

## 2015-11-23 MED ORDER — WHITE PETROLATUM GEL
Status: AC
  Administered 2015-11-23: 1
  Filled 2015-11-23: qty 1

## 2015-11-23 MED ORDER — PAROXETINE HCL 20 MG PO TABS
40.0000 mg | ORAL_TABLET | Freq: Every day | ORAL | Status: DC
  Administered 2015-11-24 – 2015-12-02 (×7): 40 mg via ORAL
  Filled 2015-11-23 (×7): qty 2

## 2015-11-23 MED ORDER — MAGNESIUM CITRATE PO SOLN
1.0000 | Freq: Once | ORAL | Status: AC
  Administered 2015-11-23: 1 via ORAL
  Filled 2015-11-23: qty 296

## 2015-11-23 NOTE — Progress Notes (Signed)
Report called pt transferring to [id number] via bed with belongings and daugther.

## 2015-11-23 NOTE — Progress Notes (Signed)
[place] TEAM 1 - Stepdown/ICU TEAM  [name]  MY:[id number] DOB: 05-May-1961 DOA: 11/07/2015 PCP: Tamsen Roers, MD    Brief Narrative:  55 yo M Hx DM, HTN, COPD, DDD, Chronic Urinary retention, and MRSA bacteremia w/ C-spine, L-spine, L foot, and L shoulder osteo initially seen at [facility] (Feb 2017) where surgery was contemplated. Daughter unsure why surgery not undertaken but long-term antibiotics were prescribed. Patient received 2 months IV antibiotics (completed course 1 May) and was discharged to [facility].  The pt was subsequently brought to ER 5/22 with altered mental status. He was hypoxic, diaphoretic. He was intubated for airway protection. He had purulent appearing urine. He was started on antibiotics, IV fluids, and pressors.  He was found to have an E coli UTI.  He remained on vancomycin for osteomyelitis/discitis, as well as newly appreciated MRSA pneumonia/tracheobronchitis.   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  -remains on vancomycin (day 17 of 56) - Neurosurgery following and planning staged surgical decompression Friday and Tuesday  Bilateral lower extremity weakness / bilateral foot drop -due to above - NS following   Sepsis due to MRSA Bacteremia w/ Pneumonia/Tracheobronchitis/Osteolyelitis  -abx per ID   E coli UTI -completed 6 day course of antibiotics  Pain management / withdrawal from high-dose opioids -on transfer from ICU narcotics were abruptly tapered and pt went into withdrawal - now stable w/ pain reasonably controlled - attempt to avoid escalating doses of narcotics - wean off haldol  Chronic Diastolic CHF -TTE 6/4 noted EF 55-60% and grade 1 DD - no volume overload on exam Filed Weights   11/19/15 0500 11/20/15 0431  Weight: 100.245 kg (221 lb) 101.152 kg (223 lb)    COPD -well compensated at present  HTN  -controlled   Acute Renal Failure - resolved  baseline Cr 0.98  Chronic Urinary Retention -likely  secondary to spine abscessed and bladder damage secondary chronic urinary retention/distention - daughter confirms pt is followed by a Urologist   Anemia -likely due to renal failure and chronic smoldering infection - slowly improving    DM2 -5/31A1c 7.2 - CBG reasonably controlled   Hypokalemia -corrected   Hypomagnesemia -corrected   DVT prophylaxis: SQ heparin  Code Status: FULL CODE Family Communication: spoke w/ daughter at bedside  Disposition Plan: transfer to tele bed on neuro unit while awaiting surgery   Consultants:  ID Neurosurgery   Procedures: 6/4 TTE EF 55-60% - grade 1 diastolic dysfunction  Antimicrobials:  Zosyn 5/21 Cefepime 5/22 - 5/25 Rocephin 5/25 > 5/26 Vancomycin 5/22 >  Subjective: C/o constipation.  Denies n/v, abdom pain, cp, or sob.  States pain in his neck/back is "fairly well controlled."  Objective: Blood pressure 131/94, pulse 98, temperature 98.7 F (37.1 C), temperature source Oral, resp. rate 17, height 6\' 1"  (1.854 m), weight 101.152 kg (223 lb), SpO2 91 %.  Intake/Output Summary (Last 24 hours) at 11/23/15 1651 Last data filed at 11/23/15 1600  Gross per 24 hour  Intake   2255 ml  Output   4601 ml  Net  -2346 ml   Filed Weights   11/19/15 0500 11/20/15 0431  Weight: 100.245 kg (221 lb) 101.152 kg (223 lb)    Examination: General: No acute respiratory distress Lungs: Clear to auscultation bilaterally - no wheezes or crackles Cardiovascular: Regular rate and rhythm without murmur  Abdomen: Nontender, protuberent, soft, bowel sounds positive, no rebound, no ascites, no appreciable mass Extremities: No significant cyanosis, or clubbing - trace  edema bilateral lower extremities  CBC:  Recent Labs Lab 11/18/15 0358 11/19/15 0406 11/20/15 0409 11/21/15 0426 11/22/15 0413  WBC 9.6 9.1 7.8 7.5 8.7  NEUTROABS 6.2 5.9 4.6 4.5  --   HGB 9.3* 9.7* 9.0* 9.1* 10.1*  HCT 31.4* 32.4* 30.2* 31.5* 33.4*  MCV 86.5 85.9 86.3 87.3  87.0  PLT 298 324 251 280 [id number]   Basic Metabolic Panel:  Recent Labs Lab 11/18/15 0358 11/19/15 0406 11/20/15 0409 11/21/15 0426 11/22/15 0413 11/23/15 0541  NA 138 138 139 138 136 136  K 3.5 3.4* 3.4* 4.0 4.2 4.4  CL 94* 94* 98* 98* 98* 96*  CO2 32 32 30 31 28 29   GLUCOSE 117* 101* 103* 115* 152* 153*  BUN 15 11 10 11 13 12   CREATININE 1.17 1.06 1.07 1.08 1.11 0.99  CALCIUM 9.5 9.6 9.4 9.2 9.7 10.1  MG 2.0 1.9 1.9 2.1 2.0  --   PHOS 5.3* 4.8* 4.9* 4.9* 5.0* 5.2*   GFR: Estimated Creatinine Clearance: 104.2 mL/min (by C-G formula based on Cr of 0.99).  Liver Function Tests:  Recent Labs Lab 11/19/15 0406 11/20/15 0409 11/21/15 0426 11/22/15 0413 11/23/15 0541  AST 17 15 15   --   --   ALT 18 16* 15*  --   --   ALKPHOS 79 76 80  --   --   BILITOT 0.3 0.3 0.3  --   --   PROT 7.7 6.9 6.9  --   --   ALBUMIN 2.6* 2.5* 2.7* 2.8* 2.9*    HbA1C: HGB A1C MFR BLD  Date/Time Value Ref Range Status  11/16/2015 04:00 AM 7.2* 4.8 - 5.6 % Final    Comment:    (NOTE)         Pre-diabetes: 5.7 - 6.4         Diabetes: >6.4         Glycemic control for adults with diabetes: <7.0   04/14/2012 05:35 AM 7.5* <5.7 % Final    Comment:    (NOTE)                                                                       According to the ADA Clinical Practice Recommendations for 2011, when HbA1c is used as a screening test:  >=6.5%   Diagnostic of Diabetes Mellitus           (if abnormal result is confirmed) 5.7-6.4%   Increased risk of developing Diabetes Mellitus References:Diagnosis and Classification of Diabetes Mellitus,Diabetes [id number] 1):S62-S69 and Standards of Medical Care in         Diabetes - 2011,Diabetes Care,2011,34 (Suppl 1):S11-S61.    CBG:  Recent Labs Lab 11/22/15 1207 11/22/15 1652 11/22/15 2112 11/23/15 0832 11/23/15 1218  GLUCAP 133* 192* 167* 155* 167*    Recent Results (from the past 240 hour(s))  Culture, blood (Routine X 2) w Reflex  to ID Panel     Status: None (Preliminary result)   Collection Time: 11/19/15 11:35 AM  Result Value Ref Range Status   Specimen Description BLOOD RIGHT ANTECUBITAL  Final   Special Requests BOTTLES DRAWN AEROBIC AND ANAEROBIC 10CC  Final   Culture NO GROWTH 4 DAYS  Final   Report Status PENDING  Incomplete  Culture, blood (Routine X 2) w Reflex to ID Panel     Status: None (Preliminary result)   Collection Time: 11/19/15 11:39 AM  Result Value Ref Range Status   Specimen Description BLOOD RIGHT ANTECUBITAL  Final   Special Requests IN PEDIATRIC BOTTLE  0.5CC  Final   Culture NO GROWTH 4 DAYS  Final   Report Status PENDING  Incomplete  Culture, Urine     Status: None   Collection Time: 11/19/15  1:21 PM  Result Value Ref Range Status   Specimen Description URINE, RANDOM  Final   Special Requests NONE  Final   Culture NO GROWTH  Final   Report Status 11/20/2015 FINAL  Final     Scheduled Meds: . collagenase   Topical Daily  . feeding supplement (ENSURE ENLIVE)  237 mL Oral TID BM  . furosemide  20 mg Intravenous Daily  . haloperidol lactate  2 mg Intravenous BID  . heparin subcutaneous  5,000 Units Subcutaneous Q8H  . insulin aspart  0-20 Units Subcutaneous TID WC  . insulin aspart  0-5 Units Subcutaneous QHS  . insulin glargine  15 Units Subcutaneous QHS  . magnesium oxide  400 mg Oral BID  . oxyCODONE  80 mg Oral Q12H  . pantoprazole  40 mg Oral Daily  . PARoxetine  40 mg Per Tube Daily  . polyethylene glycol  17 g Oral BID  . pregabalin  150 mg Oral Q8H  . senna-docusate  1 tablet Oral BID  . sodium chloride flush  10-40 mL Intracatheter Q12H  . tamsulosin  0.4 mg Oral Daily  . vancomycin  750 mg Intravenous Q12H     LOS: 16 days   Time spent: 35 minutes   Cherene Altes, MD [facility] Office  [phone] Pager - Text Page per Amion as per below:  On-Call/Text Page:      [email or web address]      password [email or web address]  If 7PM-7AM, please contact  night-coverage [email or web address] Password [facility] 11/23/2015, 4:51 PM

## 2015-11-23 NOTE — Progress Notes (Signed)
Pt arrived to 5M08 @ 1904, Pt A&Ox 4, Pt VS taken, pt on 2L O2Pt without distress. Family at the bedside. Diet ordered, will monitor.

## 2015-11-24 DIAGNOSIS — R400 Somnolence: Secondary | ICD-10-CM

## 2015-11-24 LAB — CBC
HEMATOCRIT: 34.2 % — AB (ref 39.0–52.0)
HEMOGLOBIN: 10.3 g/dL — AB (ref 13.0–17.0)
MCH: 25.6 pg — ABNORMAL LOW (ref 26.0–34.0)
MCHC: 30.1 g/dL (ref 30.0–36.0)
MCV: 84.9 fL (ref 78.0–100.0)
Platelets: 254 10*3/uL (ref 150–400)
RBC: 4.03 MIL/uL — AB (ref 4.22–5.81)
RDW: 17.3 % — ABNORMAL HIGH (ref 11.5–15.5)
WBC: 13.5 10*3/uL — AB (ref 4.0–10.5)

## 2015-11-24 LAB — CULTURE, BLOOD (ROUTINE X 2)
CULTURE: NO GROWTH
Culture: NO GROWTH

## 2015-11-24 LAB — GLUCOSE, CAPILLARY
GLUCOSE-CAPILLARY: 155 mg/dL — AB (ref 65–99)
GLUCOSE-CAPILLARY: 141 mg/dL — AB (ref 65–99)
Glucose-Capillary: 176 mg/dL — ABNORMAL HIGH (ref 65–99)
Glucose-Capillary: 223 mg/dL — ABNORMAL HIGH (ref 65–99)

## 2015-11-24 LAB — VANCOMYCIN, TROUGH: VANCOMYCIN TR: 36 ug/mL — AB (ref 10.0–20.0)

## 2015-11-24 LAB — RENAL FUNCTION PANEL
ALBUMIN: 2.9 g/dL — AB (ref 3.5–5.0)
ANION GAP: 9 (ref 5–15)
BUN: 20 mg/dL (ref 6–20)
CALCIUM: 9.5 mg/dL (ref 8.9–10.3)
CO2: 30 mmol/L (ref 22–32)
Chloride: 92 mmol/L — ABNORMAL LOW (ref 101–111)
Creatinine, Ser: 1.59 mg/dL — ABNORMAL HIGH (ref 0.61–1.24)
GFR, EST AFRICAN AMERICAN: 54 mL/min — AB (ref >60)
GFR, EST NON AFRICAN AMERICAN: 47 mL/min — AB (ref >60)
Glucose, Bld: 150 mg/dL — ABNORMAL HIGH (ref 65–99)
PHOSPHORUS: 5.4 mg/dL — AB (ref 2.5–4.6)
POTASSIUM: 5.5 mmol/L — AB (ref 3.5–5.1)
SODIUM: 131 mmol/L — AB (ref 135–145)

## 2015-11-24 MED ORDER — PREGABALIN 75 MG PO CAPS
75.0000 mg | ORAL_CAPSULE | Freq: Three times a day (TID) | ORAL | Status: DC
  Administered 2015-11-24 – 2015-12-02 (×20): 75 mg via ORAL
  Filled 2015-11-24 (×3): qty 1
  Filled 2015-11-24: qty 3
  Filled 2015-11-24 (×6): qty 1
  Filled 2015-11-24: qty 3
  Filled 2015-11-24 (×6): qty 1
  Filled 2015-11-24: qty 3
  Filled 2015-11-24 (×2): qty 1

## 2015-11-24 MED ORDER — SODIUM CHLORIDE 0.9 % IV BOLUS (SEPSIS)
1000.0000 mL | Freq: Once | INTRAVENOUS | Status: AC
  Administered 2015-11-24: 1000 mL via INTRAVENOUS

## 2015-11-24 MED ORDER — SODIUM POLYSTYRENE SULFONATE 15 GM/60ML PO SUSP
15.0000 g | Freq: Once | ORAL | Status: AC
  Administered 2015-11-24: 15 g via ORAL
  Filled 2015-11-24: qty 60

## 2015-11-24 NOTE — Progress Notes (Signed)
Pharmacy Antibiotic Note  Gregory [name] is a 55 y.o. male admitted on 11/07/2015 with disseminated MRSA infection including C5-6 discitis/osteomyelitis and pneumonia. Pharmacy consulted for Vancomycin dosing.  Today is D#18/56 as recommended per ID. The patient's SCr was noted to bump up today to 1.59 << 0.99. A Vancomycin trough was drawn prior to the AM dose and resulted as SUPRAtherapeutic (VT 36 mcg/ml, goal of 15-20 mcg/ml). The patient's last dose was on 6/7 evening. Will plan to check a VR tomorrow to further address doses and will monitor SCr changes.   Plan 1. Hold Vancomycin  2. Will recheck a VR on 6/9 @ 1200 to further address dosing 3. Will also watch SCr/UOP trends  Height: 6\' 1"  (185.4 cm) Weight:  (unable to weight pt/pt unable to stand/air bed no scale) IBW/kg (Calculated) : 79.9  Temp (24hrs), Avg:98.2 F (36.8 C), Min:97.6 F (36.4 C), Max:98.7 F (37.1 C)   Recent Labs Lab 11/19/15 0406 11/20/15 0409 11/21/15 0426 11/22/15 0413 11/22/15 0714 11/23/15 0541 11/24/15 0530 11/24/15 1020  WBC 9.1 7.8 7.5 8.7  --   --  13.5*  --   CREATININE 1.06 1.07 1.08 1.11  --  0.99 1.59*  --   VANCOTROUGH  --   --   --   --  25*  --   --  36*    Estimated Creatinine Clearance: 64.9 mL/min (by C-G formula based on Cr of 1.59).    No Known Allergies  Antimicrobials this admission: 5/22 Vanc >>5/24; 5/26>> 5/22 Cefepime >>5/24 5/24 Ceftriaxone>>5/26  Dose adjustments this admission: * 5/30 VT 15 on 750 q12h  * 6/6 VT 25 (but drawn 2 hours 45 mins early) so true trough is actually ~ 20 * 6/9 VT 36 >> doses held  Microbiology results: 5/22 Blood x2>> negF 5/22 UCx >> 100k ecol MRSA PCR Pos 5/23 Urine >> negF  5/23 Blood x2 >> negF 5/25 Blood x2 >> ngtd  5/25 Urine >> negF 5/26 tracheal - abundant MRSA 6/3 blood x2 >> ngx4d 6/3 urine >> NG  Thank you for allowing pharmacy to be a part of this patient's care.  Alycia Rossetti, PharmD, BCPS Clinical  Pharmacist Pager: [phone] 11/24/2015 2:49 PM

## 2015-11-24 NOTE — Progress Notes (Signed)
PROGRESS NOTE    Gregory [name]  [name] DOB: 1961/01/18 DOA: 11/07/2015 PCP: Tamsen Roers, MD   Brief Narrative:  55 yo WM PMHx Diabetes mellitus without complication , HTN, COPD (not on home O2), DDD (degenerative disc disease), lumbar Osteomyelitis, Chronic Pain Syndrome (lower back),Chronic Urinary retention. The following history obtained from [place] [facility] RN) C-spine osteomyelitis and L-spine abscess times ~4 months. Initially seen at [facility] where surgery was contemplated. Daughter [name] why surgery not undertaken but decided long-term antibiotics. Patient received 2 months IV antibiotics (completed course 1 May), was then discharged to [facility].  Brought to ER with altered mental status. He resides at [facility]. He was hypoxic, diaphoretic. He was given Narcan and then became agitated. He was given ativan, and became sedated. He was noted to have fever, low BP. He was intubated for airway protection. He had purulent appearing urine. He was started on antibiotics, IV fluids, and pressors.  He was tx for MRSA bacteremia in February 2017 and multifocal osteomyelitis of Lt foot, Lt shoulder, and spine.  @@55  yo male from NH with acute encephalopathy 2nd to septic shock from UTI, AKI, VDRF, and chronic opiate use. He has hx of MRSA bacteremia with diffuse areas of osteomyelitis in February 2017. Admitted with E coli UTI. Patient continuing on vancomycin for osteomyelitis/discitis. Now has MRSA pneumonia/tracheobronchitis. Continuing vancomycin. ID following. Delirium seems to slowly be improving on Seroquel & Haldol IV, able to wean off methadone, precedex minimal. Restarted home chronic pain meds, and home pregabalin and paxil. Much less agitation this AM.  Assessment & Plan:   Active Problems:   Septic shock (HCC)   Altered mental status   Pressure ulcer   Acute respiratory failure with hypoxemia (HCC)   Sepsis (HCC)   UTI (lower urinary tract  infection)   Osteomyelitis (HCC)   Encounter for central line placement   Encounter for orogastric (OG) tube placement   Endotracheally intubated   Osteomyelitis of cervical spine (HCC)   Cervical discitis   Sepsis due to methicillin resistant Staphylococcus aureus (MRSA) ([place])   CAP (community acquired pneumonia) due to MRSA (methicillin resistant Staphylococcus aureus) ([place])   Escherichia coli urinary tract infection   Chronic diastolic CHF (congestive heart failure) (HCC)   Pulmonary hypertension (HCC)   Essential hypertension   HLD (hyperlipidemia)   Acute renal failure (HCC)   Urinary retention   Uncontrolled type 2 diabetes mellitus with complication (HCC)   Discitis of cervical region   Synovial cyst of lumbar spine   Weakness of both lower extremities   Foot drop, bilateral   Pneumonia of both lower lobes due to methicillin resistant Staphylococcus aureus (MRSA) (HCC)   Opioid use with withdrawal (HCC)   Acute respiratory failure with hypoxia (HCC)   Abscess in epidural [name] of lumbar spine  C5-6 Discitis/positive MRSA Osteomyelitis Cervical spine w/ Trace Epidural Abscess/L4-5 Foraminal Cyst vs abscess -Vancomycin 56 days per ID  -Air mattress overlay; ensure on rotation at all times -Spoke with Dr. Kristeen Miss Neurosurgery; concerned that patient was able to ambulate in January and now has bilateral lower extremity weakness with footdrop. -Per neurosurgery 6/6 scheduled staged surgery, 6/9 Friday afternoon for C5, C6 corpectomy and Tuesday afternoon for C3-7 laminectomy and lateral mass fusion.  Bilateral lower extremity weakness/bilateral foot drop -L-spine cyst/abscess with severe canal stenosis and disc Bulge L4-5 -Unknown when neurosurgery plans to schedule surgery  Sepsis/positive MRSA Pneumonia/Tracheobronchitis -Completed course antibiotics  E coli UTI -Completed  course of antibiotics  Positive MRSA bacteremia -Completed course of antibiotics -6/6 Blood  culture negative PICC line placed -Will require TEE; after completion of C-spine surgery?  Pain management/Neuropathy/withdrawal from high-dose opioids (oxycodone= 240 mg daily+ Zanaflex) -The following pain medication was abruptly DCed one patient was transferred to SDU;Versed drip 5/31,Fentanyl gtt 6/1, Fentanyl IV injection  5/31, Precedex 6/1, Methadone  5/31,Dilaudid  6/1,Seroquel bid 6/1. Patient was then only restarted on OxyContin 90 mg BID(NOTE patient was on up to 240 mg daily of OxyContin/oxycodone+ Zanaflex at home) -6/6 Decrease OxyContin 80 mg BID (patient reported as being extremely drowsy during the day) -OxyIR 5 mg QID PRN breakthrough pain -Lyrica 75 mg TID -Diazepam QID PRN  Acute Hypoxic Respiratory Failure/COPD -Most likely multifactorial to include MRSA pneumonia, COPD exacerbation, and overdose on narcotics, required Narcan in ED.  Chronic Diastolic CHF -Echocardiogram; mild diastolic dysfunction see results below -Strict in and out since admission - 9.2 L -Daily weight Filed Weights   11/19/15 0500 11/20/15 0431  Weight: 100.245 kg (221 lb) 101.152 kg (223 lb)  -EKG on 6/3 -D/C Lasix; Surgery in Am -Transfuse for hemoglobin<8   HTN  -Much better controlled after obtaining adequate pain control.  HLD  Acute Renal Failure( Baseline Creatinine 0.98) Lab Results  Component Value Date   CREATININE 1.59* 11/24/2015   CREATININE 0.99 11/23/2015   CREATININE 1.11 11/22/2015  . Chronic Urinary Retention -Most likely secondary to L-spine abscess, and bladder damage secondary urinary retention. Has seen urologist per daughter who has confirmed bladder damage  Anemia - No signs of active bleeding. -5/24 transfuse 1u PRBC  Recent Labs Lab 11/19/15 0406 11/20/15 0409 11/21/15 0426 11/22/15 0413 11/24/15 0530  HGB 9.7* 9.0* 9.1* 10.1* 10.3*    DM type II uncontrolled with complications -[id number] hemoglobin A1c= 7.2 -Lantus 15 units daily -Resistant  SSI  Hypokalemia/Hyperkalemia -Potassium goal> 4 -Slightly elevated today secondary to dehydration; bolus 1 L normal saline then normal saline 50 ml/hr  Hypomagnesemia -Magnesium goal>2  Hyperphosphatemia -Only slightly elevated see hyperkalemia     DVT prophylaxis: Subcutaneous heparin Code Status: Full Family Communication: None available  Disposition Plan: C-spine surgery Friday and next Tuesday   Consultants:  Dr.Robert W Comer infectious disease Dr Consuella Lose. Neurosurgery   Procedures/Significant Events:  5/22 CT head: mild small vessel ischemic changes 5/24 Port CXR: ETT & L IJ CVL in good position. No new focal opacity. 5/24 Port Renal U/S: No mass or hydronephrosis. 5/25 CT Abd/Pelvis: Splenomegaly noted. Inflammatory changes adjacent bladder. 5/26 MRI Spine: C5-6 discitis osteomyelitis w/ trace epidural abscess.  - Grade 1 L4-5 anterolisthesis-- bright STIR signal and enhancingbone marrow signal within the RIGHT L4-5 facet. -Congenital canal narrowing moderate L4-5 and mild L5-S1 disc height loss-- mild desiccation.-- Mild acute on chronic discogenic endplate changes at 075-GRM. -Central displacement of the cauda equina due to canal stenosis.  -L3-4: Large 5 mm broad-based disc bulge. -Moderate facet arthropathy and ligamentum flavum redundancy. -Severe canal stenosis. -Moderate to severe bilateral neural foraminal narrowing. -L4-5: Anterolisthesis. Moderate broad-based disc bulge. Severe facet arthropathy and ligamentum flavum redundancy -Severe canal stenosis. Severe RIGHT greater than LEFT neural foraminal narrowing. Abnormal signal within the RIGHT L4-5 lateral recess extend toward the RIGHT L5-S1 neural foramen, contiguous with the RIGHT facet with superimposed enhancement.  -6 mm RIGHT facet synovial cyst, less likely abscess.  -L5-S1: Moderate RIGHT, mild LEFT neural foraminal narrowing. 5/26 R Foot X-ray: Soft tissue ulcer at tip of the first distal  phalanx. No evidence of  osteomyelitis and right foot. 6/4 echocardiogram;- LVEF= 55% to 60%.-- (grade 1 diastolic dysfunction). - Pericardium, extracardiac: A small pericardial effusion     Cultures 5/22 Blood: Negative  5/22 Urine:Positive E coli 5/23 Blood: Negative  5/23 Urine: Negative 5/25 Blood >> NGTD 5/25 Urine: Negative 5/26 Trach Asp: Positive MRSA 6/3 blood pending 6/3 urine pending   Antimicrobials: Zosyn 5/22>>>off Cefepime 5/22 - 5/25 Rocephin 5/25 - 5/26 Vancomycin 5/22 - 5/24; 5/26>>   Devices    LINES / TUBES:  OETT 7.5 5/22 - 5/27; 5/27 >> L IJ CVL 5/22 >>fever 6/1 OGT 5/22 >> DCed? Foley 5/22 >> Midline PICC 6/6>>     Continuous Infusions: . sodium chloride 10 mL/hr at 11/23/15 0600     Subjective: 6/8  A/O 4, Patient resting comfortably in bedA little more lethargic today.     Objective: Filed Vitals:   11/24/15 0130 11/24/15 0530 11/24/15 0907 11/24/15 1351  BP: 94/71 109/83 101/71 94/67  Pulse: 100 100 98 91  Temp: 98.2 F (36.8 C) 97.6 F (36.4 C) 98 F (36.7 C) 98.4 F (36.9 C)  TempSrc: Oral Oral Oral Oral  Resp: 18 18 18 18   Height:      Weight:      SpO2: 93% 93% 92% 96%    Intake/Output Summary (Last 24 hours) at 11/24/15 1722 Last data filed at 11/24/15 1233  Gross per 24 hour  Intake     30 ml  Output    950 ml  Net   -920 ml   Filed Weights   11/19/15 0500 11/20/15 0431  Weight: 100.245 kg (221 lb) 101.152 kg (223 lb)    Examination:  General:  A/O 4, Pain is controlled,More lethargic, No acute respiratory distress Eyes: negative scleral hemorrhage, negative anisocoria, negative icterus ENT: Negative Runny nose, negative gingival bleeding, Neck:  Negative scars, masses, torticollis, lymphadenopathy, JVD Lungs: Clear to auscultation bilaterally without wheezes or crackles Cardiovascular:  Regular rhythm and rate without murmur gallop or rub normal S1 and S2 Abdomen: negative abdominal pain,  nondistended, positive soft, bowel sounds, no rebound, no ascites, no appreciable mass Extremities: patient has bilateral foot drop Rt>>Lt, ulcer on the right first metatarsal partially healed negative discharge Clean and covered.   Skin: Rosacea  Psychiatric:  Negative depression, negative anxiety, negative fatigue, opioid withdrawal  SSx under control.   Neurologic:  Cranial nerves II through XII intact, tongue/uvula midline, all extremities muscle strength 5/5,Bilateral foot drop Rt>>Lt (unable to dorsiflex right foot at all), negative dysarthria, negative expressive aphasia, negative receptive aphasia.   .     Data Reviewed: Care during the described time interval was provided by me .  I have reviewed this patient's available data, including medical history, events of note, physical examination, and all test results as part of my evaluation. I have personally reviewed and interpreted all radiology studies.  CBC:  Recent Labs Lab 11/18/15 0358 11/19/15 0406 11/20/15 0409 11/21/15 0426 11/22/15 0413 11/24/15 0530  WBC 9.6 9.1 7.8 7.5 8.7 13.5*  NEUTROABS 6.2 5.9 4.6 4.5  --   --   HGB 9.3* 9.7* 9.0* 9.1* 10.1* 10.3*  HCT 31.4* 32.4* 30.2* 31.5* 33.4* 34.2*  MCV 86.5 85.9 86.3 87.3 87.0 84.9  PLT 298 324 251 280 262 0000000   Basic Metabolic Panel:  Recent Labs Lab 11/18/15 0358 11/19/15 0406 11/20/15 0409 11/21/15 0426 11/22/15 0413 11/23/15 0541 11/24/15 0530  NA 138 138 139 138 136 136 131*  K 3.5 3.4* 3.4* 4.0 4.2  4.4 5.5*  CL 94* 94* 98* 98* 98* 96* 92*  CO2 32 32 30 31 28 29 30   GLUCOSE 117* 101* 103* 115* 152* 153* 150*  BUN 15 11 10 11 13 12 20   CREATININE 1.17 1.06 1.07 1.08 1.11 0.99 1.59*  CALCIUM 9.5 9.6 9.4 9.2 9.7 10.1 9.5  MG 2.0 1.9 1.9 2.1 2.0  --   --   PHOS 5.3* 4.8* 4.9* 4.9* 5.0* 5.2* 5.4*   GFR: Estimated Creatinine Clearance: 64.9 mL/min (by C-G formula based on Cr of 1.59). Liver Function Tests:  Recent Labs Lab 11/19/15 0406  11/20/15 0409 11/21/15 0426 11/22/15 0413 11/23/15 0541 11/24/15 0530  AST 17 15 15   --   --   --   ALT 18 16* 15*  --   --   --   ALKPHOS 79 76 80  --   --   --   BILITOT 0.3 0.3 0.3  --   --   --   PROT 7.7 6.9 6.9  --   --   --   ALBUMIN 2.6* 2.5* 2.7* 2.8* 2.9* 2.9*   No results for input(s): LIPASE, AMYLASE in the last 168 hours. No results for input(s): AMMONIA in the last 168 hours. Coagulation Profile: No results for input(s): INR, PROTIME in the last 168 hours. Cardiac Enzymes: No results for input(s): CKTOTAL, CKMB, CKMBINDEX, TROPONINI in the last 168 hours. BNP (last 3 results) No results for input(s): PROBNP in the last 8760 hours. HbA1C: No results for input(s): HGBA1C in the last 72 hours. CBG:  Recent Labs Lab 11/23/15 1706 11/23/15 2120 11/24/15 0632 11/24/15 1222 11/24/15 1616  GLUCAP 154* 189* 155* 176* 223*   Lipid Profile: No results for input(s): CHOL, HDL, LDLCALC, TRIG, CHOLHDL, LDLDIRECT in the last 72 hours. Thyroid Function Tests: No results for input(s): TSH, T4TOTAL, FREET4, T3FREE, THYROIDAB in the last 72 hours. Anemia Panel: No results for input(s): VITAMINB12, FOLATE, FERRITIN, TIBC, IRON, RETICCTPCT in the last 72 hours. Urine analysis:    Component Value Date/Time   COLORURINE YELLOW 11/10/2015 1720   APPEARANCEUR HAZY* 11/10/2015 1720   LABSPEC 1.014 11/10/2015 1720   PHURINE 6.0 11/10/2015 1720   GLUCOSEU NEGATIVE 11/10/2015 1720   HGBUR SMALL* 11/10/2015 1720   BILIRUBINUR NEGATIVE 11/10/2015 1720   KETONESUR 15* 11/10/2015 1720   PROTEINUR 100* 11/10/2015 1720   UROBILINOGEN 1.0 02/03/2014 0052   NITRITE NEGATIVE 11/10/2015 1720   LEUKOCYTESUR SMALL* 11/10/2015 1720   Sepsis Labs: @LABRCNTIP (procalcitonin:4,lacticidven:4)  ) Recent Results (from the past 240 hour(s))  Culture, blood (Routine X 2) w Reflex to ID Panel     Status: None   Collection Time: 11/19/15 11:35 AM  Result Value Ref Range Status   Specimen  Description BLOOD RIGHT ANTECUBITAL  Final   Special Requests BOTTLES DRAWN AEROBIC AND ANAEROBIC 10CC  Final   Culture NO GROWTH 5 DAYS  Final   Report Status 11/24/2015 FINAL  Final  Culture, blood (Routine X 2) w Reflex to ID Panel     Status: None   Collection Time: 11/19/15 11:39 AM  Result Value Ref Range Status   Specimen Description BLOOD RIGHT ANTECUBITAL  Final   Special Requests IN PEDIATRIC BOTTLE  0.5CC  Final   Culture NO GROWTH 5 DAYS  Final   Report Status 11/24/2015 FINAL  Final  Culture, Urine     Status: None   Collection Time: 11/19/15  1:21 PM  Result Value Ref Range Status   Specimen Description  URINE, RANDOM  Final   Special Requests NONE  Final   Culture NO GROWTH  Final   Report Status 11/20/2015 FINAL  Final         Radiology Studies: No results found.      Scheduled Meds: . collagenase   Topical Daily  . feeding supplement (ENSURE ENLIVE)  237 mL Oral TID BM  . furosemide  20 mg Oral Daily  . haloperidol lactate  1 mg Intravenous BID  . heparin subcutaneous  5,000 Units Subcutaneous Q8H  . insulin aspart  0-20 Units Subcutaneous TID WC  . insulin aspart  0-5 Units Subcutaneous QHS  . insulin glargine  15 Units Subcutaneous QHS  . magnesium oxide  400 mg Oral BID  . oxyCODONE  80 mg Oral Q12H  . pantoprazole  40 mg Oral Daily  . PARoxetine  40 mg Oral Daily  . polyethylene glycol  17 g Oral BID  . pregabalin  75 mg Oral Q8H  . senna-docusate  1 tablet Oral BID  . sodium chloride flush  10-40 mL Intracatheter Q12H  . sorbitol  15 mL Oral BID  . tamsulosin  0.4 mg Oral Daily   Continuous Infusions: . sodium chloride 10 mL/hr at 11/23/15 0600     LOS: 17 days    Time spent: 40 minutes    WOODS, Geraldo Docker, MD [facility] Pager [phone]   If 7PM-7AM, please contact night-coverage [email or web address] Password [email or web address] 11/24/2015, 5:22 PM

## 2015-11-24 NOTE — Progress Notes (Signed)
Lab called with critical Vancomycin trough level. Pharmacy dc Vancomycin per Benjamine Mola. Md text paged. Pt with no noted distress. Will monitor.

## 2015-11-24 NOTE — Progress Notes (Signed)
No issues overnight. No significant c/o currently.  EXAM:  BP 94/67 mmHg  Pulse 91  Temp(Src) 98.4 F (36.9 C) (Oral)  Resp 18  Ht 6\' 1"  (1.854 m)  Wt 101.152 kg (223 lb)  BMI 29.43 kg/m2  SpO2 96%  Awake, alert, oriented  Speech fluent, appropriate  Diffuse 3-4/5 quadriparesis  IMPRESSION:  55 y.o. 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

## 2015-11-24 NOTE — Progress Notes (Signed)
Pt refuses bilateral boots for footdrop. Daughter at bedside.

## 2015-11-25 ENCOUNTER — Encounter (HOSPITAL_COMMUNITY)
Admission: EM | Disposition: A | Discharge status: Skilled Nursing Facility | Payer: Self-pay | Source: Home / Self Care | Attending: Pulmonary Disease

## 2015-11-25 ENCOUNTER — Encounter (HOSPITAL_COMMUNITY): Payer: Self-pay | Admitting: Certified Registered Nurse Anesthetist

## 2015-11-25 ENCOUNTER — Inpatient Hospital Stay (HOSPITAL_COMMUNITY): Payer: Medicaid Other | Admitting: Certified Registered Nurse Anesthetist

## 2015-11-25 ENCOUNTER — Inpatient Hospital Stay (HOSPITAL_COMMUNITY): Payer: Medicaid Other

## 2015-11-25 HISTORY — PX: ANTERIOR CERVICAL CORPECTOMY: SHX1159

## 2015-11-25 LAB — GLUCOSE, CAPILLARY
Glucose-Capillary: 128 mg/dL — ABNORMAL HIGH (ref 65–99)
Glucose-Capillary: 88 mg/dL (ref 65–99)
Glucose-Capillary: 153 mg/dL — ABNORMAL HIGH (ref 65–99)
Glucose-Capillary: 160 mg/dL — ABNORMAL HIGH (ref 65–99)

## 2015-11-25 LAB — VANCOMYCIN, RANDOM: Vancomycin Rm: 17 ug/mL

## 2015-11-25 SURGERY — ANTERIOR CERVICAL CORPECTOMY
Anesthesia: General | Site: Spine Cervical

## 2015-11-25 MED ORDER — MIDAZOLAM HCL 5 MG/5ML IJ SOLN
INTRAMUSCULAR | Status: DC | PRN
  Administered 2015-11-25: 2 mg via INTRAVENOUS

## 2015-11-25 MED ORDER — ROCURONIUM BROMIDE 50 MG/5ML IV SOLN
INTRAVENOUS | Status: AC
  Filled 2015-11-25: qty 1

## 2015-11-25 MED ORDER — LIDOCAINE-EPINEPHRINE 1 %-1:100000 IJ SOLN
INTRAMUSCULAR | Status: DC | PRN
  Administered 2015-11-25: 5 mL

## 2015-11-25 MED ORDER — VANCOMYCIN HCL IN DEXTROSE 1-5 GM/200ML-% IV SOLN
1000.0000 mg | INTRAVENOUS | Status: DC
  Administered 2015-11-25 – 2015-11-27 (×3): 1000 mg via INTRAVENOUS
  Filled 2015-11-25 (×5): qty 200

## 2015-11-25 MED ORDER — FENTANYL CITRATE (PF) 250 MCG/5ML IJ SOLN
INTRAMUSCULAR | Status: DC | PRN
  Administered 2015-11-25 (×3): 50 ug via INTRAVENOUS
  Administered 2015-11-25: 100 ug via INTRAVENOUS
  Administered 2015-11-25 (×5): 50 ug via INTRAVENOUS

## 2015-11-25 MED ORDER — LIDOCAINE HCL (CARDIAC) 20 MG/ML IV SOLN
INTRAVENOUS | Status: DC | PRN
  Administered 2015-11-25: 100 mg via INTRAVENOUS

## 2015-11-25 MED ORDER — THROMBIN 5000 UNITS EX SOLR
OROMUCOSAL | Status: DC | PRN
  Administered 2015-11-25 (×2): 5 mL via TOPICAL

## 2015-11-25 MED ORDER — ONDANSETRON HCL 4 MG/2ML IJ SOLN: 4.0000 mg | Freq: Once | INTRAMUSCULAR | Status: DC | PRN

## 2015-11-25 MED ORDER — 0.9 % SODIUM CHLORIDE (POUR BTL) OPTIME
TOPICAL | Status: DC | PRN
  Administered 2015-11-25: 1000 mL

## 2015-11-25 MED ORDER — MIDAZOLAM HCL 2 MG/2ML IJ SOLN
INTRAMUSCULAR | Status: AC
  Filled 2015-11-25: qty 2

## 2015-11-25 MED ORDER — ROCURONIUM BROMIDE 100 MG/10ML IV SOLN
INTRAVENOUS | Status: DC | PRN
  Administered 2015-11-25: 30 mg via INTRAVENOUS
  Administered 2015-11-25: 20 mg via INTRAVENOUS
  Administered 2015-11-25: 50 mg via INTRAVENOUS
  Administered 2015-11-25 (×2): 10 mg via INTRAVENOUS

## 2015-11-25 MED ORDER — PHENYLEPHRINE 40 MCG/ML (10ML) SYRINGE FOR IV PUSH (FOR BLOOD PRESSURE SUPPORT)
PREFILLED_SYRINGE | INTRAVENOUS | Status: AC
  Filled 2015-11-25: qty 10

## 2015-11-25 MED ORDER — SUCCINYLCHOLINE CHLORIDE 20 MG/ML IJ SOLN
INTRAMUSCULAR | Status: DC | PRN
  Administered 2015-11-25: 120 mg via INTRAVENOUS

## 2015-11-25 MED ORDER — OXYCODONE HCL 5 MG/5ML PO SOLN: 10.0000 mg | Freq: Once | ORAL | Status: DC | PRN

## 2015-11-25 MED ORDER — PHENYLEPHRINE HCL 10 MG/ML IJ SOLN
10.0000 mg | INTRAVENOUS | Status: DC | PRN
  Administered 2015-11-25: 40 ug/min via INTRAVENOUS

## 2015-11-25 MED ORDER — ONDANSETRON HCL 4 MG/2ML IJ SOLN
INTRAMUSCULAR | Status: DC | PRN
  Administered 2015-11-25: 4 mg via INTRAVENOUS

## 2015-11-25 MED ORDER — ONDANSETRON HCL 4 MG/2ML IJ SOLN
INTRAMUSCULAR | Status: AC
  Filled 2015-11-25: qty 2

## 2015-11-25 MED ORDER — PROPOFOL 10 MG/ML IV BOLUS
INTRAVENOUS | Status: AC
  Filled 2015-11-25: qty 20

## 2015-11-25 MED ORDER — PHENYLEPHRINE HCL 10 MG/ML IJ SOLN
INTRAMUSCULAR | Status: DC | PRN
  Administered 2015-11-25 (×4): 80 ug via INTRAVENOUS
  Administered 2015-11-25: 120 ug via INTRAVENOUS
  Administered 2015-11-25: 80 ug via INTRAVENOUS

## 2015-11-25 MED ORDER — PROPOFOL 10 MG/ML IV BOLUS
INTRAVENOUS | Status: DC | PRN
  Administered 2015-11-25: 50 mg via INTRAVENOUS
  Administered 2015-11-25: 100 mg via INTRAVENOUS

## 2015-11-25 MED ORDER — FENTANYL CITRATE (PF) 250 MCG/5ML IJ SOLN
INTRAMUSCULAR | Status: AC
  Filled 2015-11-25: qty 5

## 2015-11-25 MED ORDER — LACTATED RINGERS IV SOLN
INTRAVENOUS | Status: DC | PRN
  Administered 2015-11-25 (×2): via INTRAVENOUS

## 2015-11-25 MED ORDER — DEXTROSE-NACL 5-0.9 % IV SOLN
INTRAVENOUS | Status: DC
  Administered 2015-11-25 – 2015-11-26 (×2): via INTRAVENOUS

## 2015-11-25 MED ORDER — HYDROMORPHONE HCL 1 MG/ML IJ SOLN: 0.2500 mg | INTRAMUSCULAR | Status: DC | PRN

## 2015-11-25 MED ORDER — SODIUM CHLORIDE 0.9 % IV SOLN: INTRAVENOUS | Status: DC

## 2015-11-25 MED ORDER — SODIUM CHLORIDE 0.9 % IR SOLN
Status: DC | PRN
  Administered 2015-11-25: 500 mL

## 2015-11-25 MED ORDER — THROMBIN 20000 UNITS EX SOLR
CUTANEOUS | Status: DC | PRN
  Administered 2015-11-25: 20 mL via TOPICAL

## 2015-11-25 MED ORDER — LIDOCAINE 2% (20 MG/ML) 5 ML SYRINGE
INTRAMUSCULAR | Status: AC
  Filled 2015-11-25: qty 5

## 2015-11-25 MED ORDER — SUGAMMADEX SODIUM 200 MG/2ML IV SOLN
INTRAVENOUS | Status: AC
  Filled 2015-11-25: qty 2

## 2015-11-25 MED ORDER — OXYCODONE HCL 5 MG PO TABS: 10.0000 mg | ORAL_TABLET | Freq: Once | ORAL | Status: DC | PRN

## 2015-11-25 MED ORDER — BUPIVACAINE HCL 0.5 % IJ SOLN
INTRAMUSCULAR | Status: DC | PRN
  Administered 2015-11-25: 5 mL

## 2015-11-25 MED ORDER — SUGAMMADEX SODIUM 200 MG/2ML IV SOLN
INTRAVENOUS | Status: DC | PRN
  Administered 2015-11-25: 188.6 mg via INTRAVENOUS

## 2015-11-25 SURGICAL SUPPLY — 72 items
APL SKNCLS STERI-STRIP NONHPOA (GAUZE/BANDAGES/DRESSINGS)
BAG DECANTER FOR FLEXI CONT (MISCELLANEOUS) ×2 IMPLANT
BENZOIN TINCTURE PRP APPL 2/3 (GAUZE/BANDAGES/DRESSINGS) IMPLANT
BLADE CLIPPER SURG (BLADE) IMPLANT
BLADE SURG 11 STRL SS (BLADE) ×2 IMPLANT
BLADE ULTRA TIP 2M (BLADE) IMPLANT
BUR MATCHSTICK NEURO 3.0 LAGG (BURR) ×2 IMPLANT
BUR ROUND FLUTED 5 RND (BURR) ×2 IMPLANT
CAGE SPINAL T2 16B 16X17 (Cage) ×2 IMPLANT
CANISTER SUCT 3000ML PPV (MISCELLANEOUS) ×2 IMPLANT
CONT SPEC 4OZ CLIKSEAL STRL BL (MISCELLANEOUS) ×2 IMPLANT
DECANTER SPIKE VIAL GLASS SM (MISCELLANEOUS) ×2 IMPLANT
DRAIN CHANNEL 10M FLAT 3/4 FLT (DRAIN) IMPLANT
DRAPE C-ARM 42X72 X-RAY (DRAPES) ×4 IMPLANT
DRAPE LAPAROTOMY 100X72 PEDS (DRAPES) ×2 IMPLANT
DRAPE MICROSCOPE LEICA (MISCELLANEOUS) ×2 IMPLANT
DRAPE POUCH INSTRU U-SHP 10X18 (DRAPES) ×2 IMPLANT
DRAPE PROXIMA HALF (DRAPES) ×2 IMPLANT
DRSG OPSITE 4X5.5 SM (GAUZE/BANDAGES/DRESSINGS) ×4 IMPLANT
DRSG OPSITE POSTOP 3X4 (GAUZE/BANDAGES/DRESSINGS) IMPLANT
DRSG OPSITE POSTOP 4X6 (GAUZE/BANDAGES/DRESSINGS) ×2 IMPLANT
DURAPREP 6ML APPLICATOR 50/CS (WOUND CARE) ×2 IMPLANT
ELECT COATED BLADE 2.86 ST (ELECTRODE) ×2 IMPLANT
ELECT REM PT RETURN 9FT ADLT (ELECTROSURGICAL) ×2
ELECTRODE REM PT RTRN 9FT ADLT (ELECTROSURGICAL) ×1 IMPLANT
ENDCAP SPINAL ALTITUDE 16 4DEG (Plate) ×4 IMPLANT
EVACUATOR SILICONE 100CC (DRAIN) IMPLANT
GAUZE SPONGE 4X4 16PLY XRAY LF (GAUZE/BANDAGES/DRESSINGS) IMPLANT
GLOVE BIO SURGEON STRL SZ 6.5 (GLOVE) ×4 IMPLANT
GLOVE BIOGEL PI IND STRL 6.5 (GLOVE) ×1 IMPLANT
GLOVE BIOGEL PI IND STRL 7.0 (GLOVE) ×1 IMPLANT
GLOVE BIOGEL PI IND STRL 7.5 (GLOVE) ×1 IMPLANT
GLOVE BIOGEL PI INDICATOR 6.5 (GLOVE) ×1
GLOVE BIOGEL PI INDICATOR 7.0 (GLOVE) ×1
GLOVE BIOGEL PI INDICATOR 7.5 (GLOVE) ×1
GLOVE ECLIPSE 7.0 STRL STRAW (GLOVE) ×4 IMPLANT
GLOVE EXAM NITRILE LRG STRL (GLOVE) IMPLANT
GLOVE EXAM NITRILE MD LF STRL (GLOVE) IMPLANT
GLOVE EXAM NITRILE XL STR (GLOVE) IMPLANT
GLOVE EXAM NITRILE XS STR PU (GLOVE) IMPLANT
GOWN STRL REUS W/ TWL LRG LVL3 (GOWN DISPOSABLE) ×2 IMPLANT
GOWN STRL REUS W/ TWL XL LVL3 (GOWN DISPOSABLE) IMPLANT
GOWN STRL REUS W/TWL 2XL LVL3 (GOWN DISPOSABLE) IMPLANT
GOWN STRL REUS W/TWL LRG LVL3 (GOWN DISPOSABLE) ×4
GOWN STRL REUS W/TWL XL LVL3 (GOWN DISPOSABLE)
HEMOSTAT POWDER KIT SURGIFOAM (HEMOSTASIS) ×2 IMPLANT
KIT BASIN OR (CUSTOM PROCEDURE TRAY) ×2 IMPLANT
KIT ROOM TURNOVER OR (KITS) ×2 IMPLANT
LIQUID BAND (GAUZE/BANDAGES/DRESSINGS) ×2 IMPLANT
MARKER SKIN DUAL TIP RULER LAB (MISCELLANEOUS) ×2 IMPLANT
NEEDLE HYPO 25X1 1.5 SAFETY (NEEDLE) ×2 IMPLANT
NEEDLE SPNL 22GX3.5 QUINCKE BK (NEEDLE) ×2 IMPLANT
NS IRRIG 1000ML POUR BTL (IV SOLUTION) ×2 IMPLANT
PACK LAMINECTOMY NEURO (CUSTOM PROCEDURE TRAY) ×2 IMPLANT
PAD ARMBOARD 7.5X6 YLW CONV (MISCELLANEOUS) ×8 IMPLANT
PLATE ARCHON-R 36MM 2L (Plate) ×2 IMPLANT
PUTTY DBX 1CC (Putty) ×2 IMPLANT
PUTTY DBX 1CC DEPUY (Putty) ×1 IMPLANT
RUBBERBAND STERILE (MISCELLANEOUS) ×4 IMPLANT
SCREW ARCHON ST VAR 4.0X15MM (Screw) ×12 IMPLANT
SCREW BN 15X4XST VA NS SPN (Screw) ×6 IMPLANT
SPONGE INTESTINAL PEANUT (DISPOSABLE) ×2 IMPLANT
SPONGE SURGIFOAM ABS GEL 100 (HEMOSTASIS) ×2 IMPLANT
STRIP CLOSURE SKIN 1/2X4 (GAUZE/BANDAGES/DRESSINGS) IMPLANT
SUT ETHILON 3 0 FSL (SUTURE) IMPLANT
SUT VIC AB 3-0 SH 8-18 (SUTURE) ×4 IMPLANT
SUT VICRYL 3-0 RB1 18 ABS (SUTURE) ×4 IMPLANT
TAPE CLOTH 3X10 TAN LF (GAUZE/BANDAGES/DRESSINGS) ×2 IMPLANT
TOWEL OR 17X24 6PK STRL BLUE (TOWEL DISPOSABLE) ×2 IMPLANT
TOWEL OR 17X26 10 PK STRL BLUE (TOWEL DISPOSABLE) ×2 IMPLANT
TRAP SPECIMEN MUCOUS 40CC (MISCELLANEOUS) ×2 IMPLANT
WATER STERILE IRR 1000ML POUR (IV SOLUTION) ×2 IMPLANT

## 2015-11-25 NOTE — Anesthesia Postprocedure Evaluation (Signed)
Anesthesia Post Note  Patient: Gregory [name]  Procedure(s) Performed: Procedure(s) (LRB): ANTERIOR CERVICAL FIVE CORPECTOMY Cervical four - six fusion (N/A)  Patient location during evaluation: PACU Anesthesia Type: General Level of consciousness: awake Pain management: pain level controlled Vital Signs Assessment: post-procedure vital signs reviewed and stable Respiratory status: spontaneous breathing Cardiovascular status: stable Anesthetic complications: no    Last Vitals:  Filed Vitals:   11/25/15 2115 11/25/15 2126  BP: 106/70 98/79  Pulse: 107 108  Temp:  36.7 C  Resp: 11 12    Last Pain:  Filed Vitals:   11/25/15 2128  PainSc: 0-No pain                 EDWARDS,Sadi Arave

## 2015-11-25 NOTE — Progress Notes (Signed)
[place] TEAM 1 - Stepdown/ICU TEAM  [name]  MY:[id number] DOB: 05-12-1961 DOA: 11/07/2015 PCP: Tamsen Roers, MD    Brief Narrative:  55 yo M Hx DM, HTN, COPD, DDD, Chronic Urinary retention, and MRSA bacteremia w/ C-spine, L-spine, L foot, and L shoulder osteo initially seen at [facility] (Feb 2017) where surgery was contemplated. Daughter unsure why surgery not undertaken but long-term antibiotics were prescribed. Patient received 2 months IV antibiotics (completed course 1 May) and was discharged to [facility].  The pt was subsequently brought to ER 5/22 with altered mental status. He was hypoxic, diaphoretic. He was intubated for airway protection. He had purulent appearing urine. He was started on antibiotics, IV fluids, and pressors.  He was found to have an E coli UTI.  He remained on vancomycin for osteomyelitis/discitis, as well as newly appreciated MRSA pneumonia/tracheobronchitis.   Subjective: The pt is resting comfortably w/ no new complaints.    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  -remains on vancomycin (day 17 of 56) - Neurosurgery following and planning staged surgical decompression today and Tuesday  Bilateral lower extremity weakness / bilateral foot drop -due to above - NS following   Sepsis due to MRSA Bacteremia w/ Pneumonia/Tracheobronchitis/Osteolyelitis  -abx per ID   E coli UTI -completed 6 day course of antibiotics - f/u urine cx no growth   Pain management / withdrawal from high-dose opioids -on transfer from ICU narcotics were abruptly tapered and pt went into withdrawal - now stable w/ pain reasonably controlled - attempt to avoid escalating doses of narcotics - weaned off haldol  Chronic Diastolic CHF -TTE 6/4 noted EF 55-60% and grade 1 DD - no volume overload on exam Filed Weights   11/20/15 0431 11/25/15 0400  Weight: 101.152 kg (223 lb) 94.348 kg (208 lb)    COPD -well compensated   HTN  -controlled   Acute  Renal Failure  baseline Cr 0.98 - crt climbing again - hydrate and follow   Chronic Urinary Retention -likely secondary to spine abscess and bladder damage secondary chronic urinary retention/distention - daughter confirms pt is followed by a Dealer as outpt   Anemia -likely due to renal failure and chronic smoldering infection - slowly improving    DM2 -5/31A1c 7.2 - CBG controlled   Hypokalemia -corrected   Hypomagnesemia -corrected   DVT prophylaxis: SQ heparin  Code Status: FULL CODE Family Communication: spoke w/ daughter  Disposition Plan: to OR today   Consultants:  ID Neurosurgery   Procedures: 6/4 TTE EF 55-60% - grade 1 diastolic dysfunction  Antimicrobials:  Zosyn 5/21 Cefepime 5/22 - 5/25 Rocephin 5/25 > 5/26 Vancomycin 5/22 >  Objective: Blood pressure 111/73, pulse 84, temperature 98.4 F (36.9 C), temperature source Oral, resp. rate 20, height 6\' 1"  (1.854 m), weight 94.348 kg (208 lb), SpO2 98 %.  Intake/Output Summary (Last 24 hours) at 11/25/15 1351 Last data filed at 11/25/15 0600  Gross per 24 hour  Intake    480 ml  Output      0 ml  Net    480 ml   Filed Weights   11/20/15 0431 11/25/15 0400  Weight: 101.152 kg (223 lb) 94.348 kg (208 lb)    Examination: General: No acute respiratory distress - resting comfortably  Lungs: Clear to auscultation bilaterally Cardiovascular: Regular rate and rhythm  Abdomen: soft, bowel sounds positive, no ascites Extremities: No significant cyanosis, clubbing, or edema bilateral lower extremities  CBC:  Recent Labs Lab  11/19/15 0406 11/20/15 0409 11/21/15 0426 11/22/15 0413 11/24/15 0530  WBC 9.1 7.8 7.5 8.7 13.5*  NEUTROABS 5.9 4.6 4.5  --   --   HGB 9.7* 9.0* 9.1* 10.1* 10.3*  HCT 32.4* 30.2* 31.5* 33.4* 34.2*  MCV 85.9 86.3 87.3 87.0 84.9  PLT 324 251 280 262 0000000   Basic Metabolic Panel:  Recent Labs Lab 11/19/15 0406 11/20/15 0409 11/21/15 0426 11/22/15 0413 11/23/15 0541  11/24/15 0530  NA 138 139 138 136 136 131*  K 3.4* 3.4* 4.0 4.2 4.4 5.5*  CL 94* 98* 98* 98* 96* 92*  CO2 32 30 31 28 29 30   GLUCOSE 101* 103* 115* 152* 153* 150*  BUN 11 10 11 13 12 20   CREATININE 1.06 1.07 1.08 1.11 0.99 1.59*  CALCIUM 9.6 9.4 9.2 9.7 10.1 9.5  MG 1.9 1.9 2.1 2.0  --   --   PHOS 4.8* 4.9* 4.9* 5.0* 5.2* 5.4*   GFR: Estimated Creatinine Clearance: 58.6 mL/min (by C-G formula based on Cr of 1.59).  Liver Function Tests:  Recent Labs Lab 11/19/15 0406 11/20/15 0409 11/21/15 0426 11/22/15 0413 11/23/15 0541 11/24/15 0530  AST 17 15 15   --   --   --   ALT 18 16* 15*  --   --   --   ALKPHOS 79 76 80  --   --   --   BILITOT 0.3 0.3 0.3  --   --   --   PROT 7.7 6.9 6.9  --   --   --   ALBUMIN 2.6* 2.5* 2.7* 2.8* 2.9* 2.9*    HbA1C: HGB A1C MFR BLD  Date/Time Value Ref Range Status  11/16/2015 04:00 AM 7.2* 4.8 - 5.6 % Final    Comment:    (NOTE)         Pre-diabetes: 5.7 - 6.4         Diabetes: >6.4         Glycemic control for adults with diabetes: <7.0   04/14/2012 05:35 AM 7.5* <5.7 % Final    Comment:    (NOTE)                                                                       According to the ADA Clinical Practice Recommendations for 2011, when HbA1c is used as a screening test:  >=6.5%   Diagnostic of Diabetes Mellitus           (if abnormal result is confirmed) 5.7-6.4%   Increased risk of developing Diabetes Mellitus References:Diagnosis and Classification of Diabetes Mellitus,Diabetes [id number] 1):S62-S69 and Standards of Medical Care in         Diabetes - 2011,Diabetes Care,2011,34 (Suppl 1):S11-S61.    CBG:  Recent Labs Lab 11/24/15 1222 11/24/15 1616 11/24/15 2210 11/25/15 0620 11/25/15 1117  GLUCAP 176* 223* 141* 128* 88    Recent Results (from the past 240 hour(s))  Culture, blood (Routine X 2) w Reflex to ID Panel     Status: None   Collection Time: 11/19/15 11:35 AM  Result Value Ref Range Status    Specimen Description BLOOD RIGHT ANTECUBITAL  Final   Special Requests BOTTLES DRAWN AEROBIC AND ANAEROBIC 10CC  Final   Culture NO  GROWTH 5 DAYS  Final   Report Status 11/24/2015 FINAL  Final  Culture, blood (Routine X 2) w Reflex to ID Panel     Status: None   Collection Time: 11/19/15 11:39 AM  Result Value Ref Range Status   Specimen Description BLOOD RIGHT ANTECUBITAL  Final   Special Requests IN PEDIATRIC BOTTLE  0.5CC  Final   Culture NO GROWTH 5 DAYS  Final   Report Status 11/24/2015 FINAL  Final  Culture, Urine     Status: None   Collection Time: 11/19/15  1:21 PM  Result Value Ref Range Status   Specimen Description URINE, RANDOM  Final   Special Requests NONE  Final   Culture NO GROWTH  Final   Report Status 11/20/2015 FINAL  Final     Scheduled Meds: . collagenase   Topical Daily  . feeding supplement (ENSURE ENLIVE)  237 mL Oral TID BM  . heparin subcutaneous  5,000 Units Subcutaneous Q8H  . insulin aspart  0-20 Units Subcutaneous TID WC  . insulin aspart  0-5 Units Subcutaneous QHS  . insulin glargine  15 Units Subcutaneous QHS  . magnesium oxide  400 mg Oral BID  . oxyCODONE  80 mg Oral Q12H  . pantoprazole  40 mg Oral Daily  . PARoxetine  40 mg Oral Daily  . polyethylene glycol  17 g Oral BID  . pregabalin  75 mg Oral Q8H  . senna-docusate  1 tablet Oral BID  . sodium chloride flush  10-40 mL Intracatheter Q12H  . sorbitol  15 mL Oral BID  . tamsulosin  0.4 mg Oral Daily  . vancomycin  1,000 mg Intravenous Q24H     LOS: 18 days   Time spent: 15 minutes   Cherene Altes, MD [facility] Office  [phone] Pager - Text Page per Amion as per below:  On-Call/Text Page:      [email or web address]      password [email or web address]  If 7PM-7AM, please contact night-coverage [email or web address] Password [email or web address] 11/25/2015, 1:51 PM

## 2015-11-25 NOTE — Progress Notes (Signed)
Pharmacy Antibiotic Note  Gregory [name] is a 55 y.o. male admitted on 11/07/2015 with disseminated MRSA infection including C5-6 discitis/osteomyelitis and pneumonia. Pharmacy consulted for Vancomycin dosing.  Today is Vancomycin D#19/56 as recommended per ID. The patient's SCr was noted to bump up on 6/8 to 1.59 << 0.99 with a resultant VT of 36 mcg/ml. A Vancomycin random this afternoon ~26 hours after the VT resulted as 17 mcg/ml. No BMET was rechecked today however based on Vancomycin kinetics - the patient's CrCl can be estimated to be around 30 ml/min. Will resume Vancomycin dosing this afternoon and order at [facility] for evaluation of renal function in the AM.   Plan 1. Restart Vancomycin 1g IV every 24 hours 2. Will order a BMET to monitor renal function for 6/10 AM 3. Will also watch UOP trends  Height: 6\' 1"  (185.4 cm) Weight: 208 lb (94.348 kg) IBW/kg (Calculated) : 79.9  Temp (24hrs), Avg:98.2 F (36.8 C), Min:97.8 F (36.6 C), Max:98.4 F (36.9 C)   Recent Labs Lab 11/19/15 0406 11/20/15 0409 11/21/15 0426 11/22/15 0413 11/22/15 0714 11/23/15 0541 11/24/15 0530 11/24/15 1020 11/25/15 1225  WBC 9.1 7.8 7.5 8.7  --   --  13.5*  --   --   CREATININE 1.06 1.07 1.08 1.11  --  0.99 1.59*  --   --   VANCOTROUGH  --   --   --   --  25*  --   --  36*  --   VANCORANDOM  --   --   --   --   --   --   --   --  17    Estimated Creatinine Clearance: 58.6 mL/min (by C-G formula based on Cr of 1.59).    No Known Allergies  Antimicrobials this admission: 5/22 Vanc >>5/24; 5/26>> 5/22 Cefepime >>5/24 5/24 Ceftriaxone>>5/26  Dose adjustments this admission: * 5/30 VT 15 on 750 q12h  * 6/6 VT 25 (but drawn 2 hours 45 mins early) so true trough is actually ~ 20 * 6/8 VT 36 >> doses held * 6/9 VT 17 >> pt specific ke~0.029 >> restart Vanc 1g/24h  Microbiology results: 5/22 Blood x2>> negF 5/22 UCx >> 100k ecol MRSA PCR Pos 5/23 Urine >> negF  5/23 Blood x2 >> negF 5/25 Blood  x2 >> ngtd  5/25 Urine >> negF 5/26 tracheal - abundant MRSA 6/3 blood x2 >> ngx4d 6/3 urine >> NG  Thank you for allowing pharmacy to be a part of this patient's care.  Alycia Rossetti, PharmD, BCPS Clinical Pharmacist Pager: [phone] 11/25/2015 1:29 PM

## 2015-11-25 NOTE — Progress Notes (Signed)
Pt still off the floor at this time.  

## 2015-11-25 NOTE — Transfer of Care (Signed)
Immediate Anesthesia Transfer of Care Note  Patient: Gregory [name]  Procedure(s) Performed: Procedure(s) with comments: ANTERIOR CERVICAL FIVE CORPECTOMY Cervical four - six fusion (N/A) - ANTERIOR CERVICAL FIVE CORPECTOMY Cervical four - six fusion  Patient Location: PACU  Anesthesia Type:General  Level of Consciousness: awake  Airway & Oxygen Therapy: Patient Spontanous Breathing and Patient connected to face mask oxygen  Post-op Assessment: Report given to RN and Post -op Vital signs reviewed and stable  Post vital signs: Reviewed and stable  Last Vitals:  Filed Vitals:   11/25/15 2100 11/25/15 2115  BP:    Pulse: 109 107  Temp:    Resp: 11 11    Last Pain:  Filed Vitals:   11/25/15 2117  PainSc: Asleep      Patients Stated Pain Goal: 3 ([id number])  Complications: No apparent anesthesia complications

## 2015-11-25 NOTE — Care Management Note (Signed)
Case Management Note  Patient Details  Name: Gregory [name] MRN: [id number] Date of Birth: March 01, 1961  Subjective/Objective:                    Action/Plan: Pt going to OR today for part one of two. CM will continue to follow for d/c needs.   Expected Discharge Date:  11/24/15               Expected Discharge Plan:  [place] (from [facility])  In-House Referral:  Clinical Social Work  Discharge planning Services  CM Consult  Post Acute Care Choice:  NA Choice offered to:  NA  DME Arranged:  N/A DME Agency:  NA  HH Arranged:  NA HH Agency:  NA  Status of Service:  Completed, signed off  Medicare Important Message Given:    Date Medicare IM Given:    Medicare IM give by:    Date Additional Medicare IM Given:    Additional Medicare Important Message give by:     If discussed at [place] of Stay Meetings, dates discussed:    Additional Comments:  Pollie Friar, RN 11/25/2015, 12:16 PM

## 2015-11-25 NOTE — Anesthesia Procedure Notes (Signed)
Procedure Name: Intubation Date/Time: 11/25/2015 5:02 PM Performed by: Ollen Bowl Pre-anesthesia Checklist: Patient identified, Emergency Drugs available, Suction available, Patient being monitored and Timeout performed Patient Re-evaluated:Patient Re-evaluated prior to inductionOxygen Delivery Method: Circle system utilized and Simple face mask Preoxygenation: Pre-oxygenation with 100% oxygen Intubation Type: IV induction Ventilation: Mask ventilation without difficulty Laryngoscope Size: Glidescope and 3 Grade View: Grade I Tube type: Oral Tube size: 7.5 mm Number of attempts: 1 Airway Equipment and Method: Patient positioned with wedge pillow,  Stylet and Video-laryngoscopy Placement Confirmation: ETT inserted through vocal cords under direct vision,  positive ETCO2 and breath sounds checked- equal and bilateral Secured at: 22 cm Tube secured with: Tape Dental Injury: Teeth and Oropharynx as per pre-operative assessment

## 2015-11-25 NOTE — Clinical Social Work Note (Signed)
Clinical Social Worker attempted to contact [employer] and left a message in regards to patient's return once medically stable for d/c.  Patient scheduled to have surgery today, 6/9 and will likely require long-term abx.  CSW remains available as needed.   Glendon Axe, MSW, LCSWA [phone] 11/25/2015 11:19 AM

## 2015-11-25 NOTE — Progress Notes (Signed)
No issues overnight.   EXAM:  BP 111/73 mmHg  Pulse 84  Temp(Src) 98.4 F (36.9 C) (Oral)  Resp 20  Ht 6\' 1"  (1.854 m)  Wt 94.348 kg (208 lb)  BMI 27.45 kg/m2  SpO2 98%  Awake, alert, oriented  Speech fluent, appropriate  CN grossly intact   Delt Bicep Tri Wrist Ext Grip Finger Abd  R 4-/5 4+/5 4/5 4/5 4/5 3/5  L 4-/5 4+/5 4/5 4/5 4/5 3/5    Hip Flex Quad PF DF EHL  R 3/5 4-/5 0/5 0/5 0/5  L 3/5 4-/5 2/5 0/5 1/5        IMPRESSION:  55 y.o. male with C5, C6 osteomyelitis/discitis with resultant kyphosis, cord compression, and myelopathy.  PLAN: - Proceed with C5-6 corpectomy, fusion - Plan on posterior decompression/lat mass fusion Tuesday  I have reviewed the details of the surgery, risks, benefits with the patient as well as his daughter. All questions were answered and consent was obtained.

## 2015-11-25 NOTE — Progress Notes (Signed)
Pt to OR.

## 2015-11-25 NOTE — Progress Notes (Signed)
Pt refusing to wear bilateral boots. Asked nurse to take them off early this morning. Pt NPO since 0900. At 0915 pt asking for more water because he said a pill was stuck in his throat. rn gave him a little more water. Then pt wanted more water. rn gave him a little bit more water. rn explained the reason for pt being NPO and to prevent any kind of aspiration pneumonia. Pt verbalized understanding. Will not give 10 med due to pt having difficulty swallowing previous medications.

## 2015-11-25 NOTE — Op Note (Signed)
PREOP DIAGNOSIS:  1. Osteomyelitis, discitis C5-C6 2. Cervical kyphotic deformity 3. Cervical stenosis with myelopathy  POSTOP DIAGNOSIS: Same  PROCEDURE: 1. Corpectomy at C5 for decompression of spinal cord and exiting nerve roots  2. Placement of intervertebral biomechanical device, [employer] expandable titanium cage 3. Placement of anterior instrumentation consisting of interbody plate and screws spanning C4-C6, NuVasive Arcon-R 66mm 4. Use of morselized bone allograft  5. Arthrodesis C4-C6, anterior interbody technique  6. Use of intraoperative microscope  SURGEON: Dr. Consuella Lose, MD  ASSISTANT: Dr. Ashok Pall, MD  ANESTHESIA: General Endotracheal  EBL: 100cc  SPECIMENS: None  DRAINS: None  COMPLICATIONS: None immediate  CONDITION: Hemodynamically stable to PACU  HISTORY: Gregory [name] is a 55 y.o. man with chronic osteomyelitis/discitis who previously completed treatment in May, however presented to the hospital with altered mental status and pneumonia, and fever. MRI at that time demonstrated kyphosis with stenosis and myelopathy at C5-6. After her recovered from pneumonia, surgical decompression was indicated. Planned 2 level corpectomy and second stage posterior decompression/stabilization was discussed with the patient and his daughter. After all questions were answered, consent was obtained.  PROCEDURE IN DETAIL: The patient was brought to the operating room and transferred to the operative table. After induction of general anesthesia, the patient was positioned on the operative table in the supine position with all pressure points meticulously padded. The skin of the neck was then prepped and draped in the usual sterile fashion.  After timeout was conducted, the skin was infiltrated with local anesthetic. Skin incision was then made sharply and Bovie electrocautery was used to dissect the subcutaneous tissue until the platysma was identified. The platysma was  then divided and undermined. The sternocleidomastoid muscle was then identified and, utilizing natural fascial planes in the neck, the prevertebral fascia was identified and the carotid sheath was retracted laterally and the trachea and esophagus retracted medially. Again using fluoroscopy, the correct disc spaces were identified. Bovie electrocautery was used to dissect in the subperiosteal plane and elevate the bilateral longus coli muscles. Self-retaining retractors were then placed. At this point, the microscope was draped and brought into the field, and the remainder of the case was done under the microscope using microdissecting technique.  Initially, the C4-5 disc space was incised sharply and rongeurs were use to initially complete a discectomy. The high-speed drill was then used to complete discectomy until the posterior annulus was identified. Attention was then turned to the C5 body which was significant collapsed and angulated. I was able to identify the C5-6 interspace, and the high-speed drill was used to remove the inferior portion of C5. The superior endplate of C6 appeared to be largely intact, and I therefore elected not to complete C6 corpectomy.  The C5 body was completely drilled away until the PLL was identified. Using a combination of rongeurs, the PLL was removed. There was significant adhesion of the PLL to the dura behind the body of C5 and a portion of the PLL was left in place in this region. The thecal sac was noted to be completely decompressed.  At this point, a 1mm x 47mm expandable titanium cage was inserted after filling with  interbody cage was sized and packed with morcellized bone allograft. This was then inserted and tapped into place.  After placement of the intervertebral device, the anterior cervical plate was selected, and placed across the interspaces. Using a high-speed drill, the cortex of the cervical vertebral bodies was punctured, and screws inserted in the C4  and  C6 levels. Final fluoroscopic images in lateral projection were taken to confirm good hardware placement.  At this point, after all counts were verified to be correct, meticulous hemostasis was secured using a combination of bipolar electrocautery and passive hemostatics. The platysma muscle was then closed using interrupted 3-0 Vicryl sutures, and the skin was closed with a interrupted subcuticular stitch. Sterile dressings were then applied and the drapes removed.  The patient tolerated the procedure well and was extubated in the room and taken to the postanesthesia care unit in stable condition.

## 2015-11-25 NOTE — Anesthesia Preprocedure Evaluation (Addendum)
Anesthesia Evaluation  Patient identified by MRN, date of birth, ID band Patient awake    Reviewed: Allergy & Precautions, H&P , NPO status , Patient's Chart, lab work & pertinent test results  History of Anesthesia Complications Negative for: history of anesthetic complications  Airway Mallampati: III  TM Distance: >3 FB Neck ROM: full    Dental  (+) Poor Dentition   Pulmonary COPD, former smoker,     + decreased breath sounds      Cardiovascular hypertension, +CHF  negative cardio ROS Normal cardiovascular exam Rhythm:regular Rate:Normal  Echo Q000111Q with diastolic heart failure, EF is 60%   Neuro/Psych PSYCHIATRIC DISORDERS negative neurological ROS     GI/Hepatic negative GI ROS, Neg liver ROS,   Endo/Other  diabetes  Renal/GU Renal disease     Musculoskeletal  (+) Arthritis ,   Abdominal   Peds  Hematology  (+) anemia ,   Anesthesia Other Findings Chronic pain  Reproductive/Obstetrics negative OB ROS                            Anesthesia Physical Anesthesia Plan  ASA: III  Anesthesia Plan: General   Post-op Pain Management:    Induction: Intravenous  Airway Management Planned: Oral ETT and Video Laryngoscope Planned  Additional Equipment:   Intra-op Plan:   Post-operative Plan:   Informed Consent: I have reviewed the patients History and Physical, chart, labs and discussed the procedure including the risks, benefits and alternatives for the proposed anesthesia with the patient or authorized representative who has indicated his/her understanding and acceptance.   Dental Advisory Given  Plan Discussed with: Anesthesiologist, CRNA and Surgeon  Anesthesia Plan Comments:         Anesthesia Quick Evaluation

## 2015-11-26 ENCOUNTER — Inpatient Hospital Stay (HOSPITAL_COMMUNITY): Payer: Medicaid Other

## 2015-11-26 LAB — GLUCOSE, CAPILLARY
GLUCOSE-CAPILLARY: 199 mg/dL — AB (ref 65–99)
GLUCOSE-CAPILLARY: 191 mg/dL — AB (ref 65–99)
GLUCOSE-CAPILLARY: 240 mg/dL — AB (ref 65–99)
Glucose-Capillary: 121 mg/dL — ABNORMAL HIGH (ref 65–99)
Glucose-Capillary: 199 mg/dL — ABNORMAL HIGH (ref 65–99)

## 2015-11-26 LAB — CBC
HCT: 30.7 % — ABNORMAL LOW (ref 39.0–52.0)
Hemoglobin: 9.1 g/dL — ABNORMAL LOW (ref 13.0–17.0)
MCH: 25.6 pg — ABNORMAL LOW (ref 26.0–34.0)
MCHC: 29.6 g/dL — ABNORMAL LOW (ref 30.0–36.0)
MCV: 86.2 fL (ref 78.0–100.0)
PLATELETS: 214 10*3/uL (ref 150–400)
RBC: 3.56 MIL/uL — AB (ref 4.22–5.81)
RDW: 16.9 % — ABNORMAL HIGH (ref 11.5–15.5)
WBC: 10.1 10*3/uL (ref 4.0–10.5)

## 2015-11-26 LAB — BASIC METABOLIC PANEL
ANION GAP: 9 (ref 5–15)
BUN: 20 mg/dL (ref 6–20)
CHLORIDE: 96 mmol/L — AB (ref 101–111)
CO2: 29 mmol/L (ref 22–32)
CREATININE: 1.25 mg/dL — AB (ref 0.61–1.24)
Calcium: 9.3 mg/dL (ref 8.9–10.3)
GFR calc non Af Amer: >60 mL/min (ref >60)
Glucose, Bld: 139 mg/dL — ABNORMAL HIGH (ref 65–99)
POTASSIUM: 4.9 mmol/L (ref 3.5–5.1)
SODIUM: 134 mmol/L — AB (ref 135–145)

## 2015-11-26 MED ORDER — PHENOL 1.4 % MT LIQD: 1.0000 | OROMUCOSAL | Status: DC | PRN

## 2015-11-26 MED ORDER — INSULIN GLARGINE 100 UNIT/ML ~~LOC~~ SOLN
10.0000 [IU] | Freq: Every day | SUBCUTANEOUS | Status: DC
  Administered 2015-11-26 – 2015-12-01 (×6): 10 [IU] via SUBCUTANEOUS
  Filled 2015-11-26 (×7): qty 0.1

## 2015-11-26 MED ORDER — PROCHLORPERAZINE EDISYLATE 5 MG/ML IJ SOLN
5.0000 mg | INTRAMUSCULAR | Status: DC | PRN
  Administered 2015-11-26: 5 mg via INTRAVENOUS
  Filled 2015-11-26 (×2): qty 2

## 2015-11-26 MED ORDER — FUROSEMIDE 10 MG/ML IJ SOLN
60.0000 mg | Freq: Once | INTRAMUSCULAR | Status: AC
  Administered 2015-11-26: 60 mg via INTRAVENOUS
  Filled 2015-11-26: qty 6

## 2015-11-26 MED ORDER — ONDANSETRON HCL 4 MG/2ML IJ SOLN
4.0000 mg | Freq: Four times a day (QID) | INTRAMUSCULAR | Status: DC | PRN
  Administered 2015-11-26: 4 mg via INTRAVENOUS
  Filled 2015-11-26: qty 2

## 2015-11-26 MED ORDER — PHENOL 1.4 % MT LIQD
1.0000 | OROMUCOSAL | Status: DC | PRN
  Administered 2015-11-26 – 2015-11-27 (×8): 1 via OROMUCOSAL
  Filled 2015-11-26: qty 177

## 2015-11-26 NOTE — Progress Notes (Signed)
Report given to [place], [place]

## 2015-11-26 NOTE — Progress Notes (Signed)
Patient ID: Gregory [name], male   DOB: 1960/06/29, 55 y.o.   MRN: [id number] Stable, c/o sore throat. Moves all 4 extremities

## 2015-11-26 NOTE — Progress Notes (Addendum)
At [place] rn asked tech to help her reposition pt higher in the bed. rn checked tele box and noticed pts HR was 114. Did full set of vitals and noticed pt was febrile. Administered tylenol and other medicines. Pt demanding throat spray and alert and oriented x4. Complaining of pain. At this time. Pt HR elevated and febrile, but O2 level was stable.lung sounds rhales/ rhonci, not clear. Pt coughing, occasionally producing sputum.  As rn provided care pts O2 began to decrease. md paged and at bedside. Decided pt should go to stepdown.   Pt had anterior cervical surgery yesterday. Went down to OR around 1530, returned around 2130. Honeycomb dressing clean dry and intact. Pt has would to right big toe. No redness or warmth felt at wound. rn assessed pts sacral area earlier today. No breakdown noted. Pt had bowel movement this morning.   At this time 0950 pt still alert and oriented x4, but talking less and less demanding, which is not typical. Pt has been sweating for the last hour. Small fan has been turned on due to rn being able to see visible sweat on pts face.   At 12L venti mask pts O2 90%, lowest O2 got was 83% while adjusting venti mask. Pt was previously intubated earlier in this admission.    Yesterday 6/9 lung sounds clear and slightly diminished, today coarse crackles throughout. On 6/9 incentive spirometry 1900, today 1300 and after 1 attempt pt did not want to do incentive spirometry anymore, rn made pt do 2 more times.

## 2015-11-26 NOTE — Progress Notes (Signed)
md paged about pts vitals and assessment

## 2015-11-26 NOTE — Progress Notes (Signed)
rn checked on pt. Pt alert and oriented x4. Does not want to eat breakfast right now. Requesting his 8 oclock pain meds

## 2015-11-26 NOTE — Progress Notes (Signed)
Gregory [name] - Stepdown/ICU TEAM  Gregory [name]  GJ:[id number] DOB: 09-Jun-1961 DOA: 11/07/2015 PCP: Tamsen Roers, MD    Brief Narrative:  55 yo M Hx DM, HTN, COPD, DDD, Chronic Urinary retention, and MRSA bacteremia w/ C-spine L-spine L foot and L shoulder osteo initially seen at [facility] (Feb 2017) where surgery was contemplated. Daughter unsure why surgery not undertaken but long-term antibiotics were prescribed. Patient received 2 months IV antibiotics (completed course [name] May) and was discharged to [facility].  The pt was subsequently brought to ER 5/22 with altered mental status. He was hypoxic, diaphoretic. He was intubated for airway protection. He had purulent appearing urine. He was started on antibiotics, IV fluids, and pressors.  He was found to have an E coli UTI.  He remained on vancomycin for osteomyelitis/discitis, as well as newly appreciated MRSA pneumonia/tracheobronchitis.   Subjective: The pt has had difficulty w/ worsening hypoxia and tachycardia this morning.  He is alert and conversant.  He c/o a sore throat, but denies cp, n/c, or abdom pain.  He wants to take his cervical collar off.    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  -remains on vancomycin - Neurosurgery following - s/p stage [name] of 2 stage cervical decompression/stabilization   Acute hypoxia -may simply be low grade aspiration - check CXR - titrate O2 upward - transfer to SDU for close monitoring - diurese   Bilateral lower extremity weakness / bilateral foot drop -due to above - NS following   Sepsis due to MRSA Bacteremia w/ Pneumonia/Tracheobronchitis/Osteolyelitis  -abx per ID   E coli UTI -completed 6 day course of antibiotics - f/u urine cx no growth   Pain management / withdrawal from high-dose opioids -stable w/ pain reasonably controlled - attempt to avoid escalating doses of narcotics  Chronic Diastolic CHF -TTE 6/4 noted EF 55-60% and grade [name] DD  Filed Weights   11/25/15 0400 11/26/15 0528  Weight: 94.348 kg (208 lb) 91.627 kg (202 lb)    COPD -well compensated   HTN  -controlled   Acute Renal Failure  baseline Cr 0.98 - follow w/ shifting fluid status   Chronic Urinary Retention -likely secondary to spine abscess and bladder damage secondary chronic urinary retention/distention - daughter confirms pt is followed by a Urologist as outpt   Anemia -likely due to renal failure and chronic smoldering infection - follow trend     DM2 -5/31A1c 7.2 - CBG controlled   Hypokalemia -corrected   Hypomagnesemia -corrected   DVT prophylaxis: SCDs Code Status: FULL CODE Family Communication: no family present at time of exam  Disposition Plan: transfer to SDU due to hypoxia   Consultants:  ID Neurosurgery   Procedures: 6/4 TTE EF 55-60% - grade [name] diastolic dysfunction  Antimicrobials:  Zosyn 5/21 Cefepime 5/22 - 5/25 Rocephin 5/25 > 5/26 Vancomycin 5/22 >  Objective: Blood pressure 137/71, pulse 123, temperature 102.5 F (39.2 C), temperature source Axillary, resp. rate 19, height 6\' [name]"  ([name]854 m), weight 91.627 kg (202 lb), SpO2 87 %.  Intake/Output Summary (Last 24 hours) at 11/26/15 0941 Last data filed at 11/26/15 0900  Gross per 24 hour  Intake   1300 ml  Output   3325 ml  Net  -2025 ml   Filed Weights   11/25/15 0400 11/26/15 0528  Weight: 94.348 kg (208 lb) 91.627 kg (202 lb)    Examination: General: hypoxic w/ sats in the mid to high 80s despite 5LPM Lungs: course crackles  th/o all fields - no wheeze  Cardiovascular: Regular rate and rhythm - no gallup or rub  Abdomen: soft, bowel sounds positive, no ascites, nontender Extremities: No significant cyanosis, or clubbing - trace edema bilateral lower extremities  CBC:  Recent Labs Lab 11/20/15 0409 11/21/15 0426 11/22/15 0413 11/24/15 0530 11/26/15 0459  WBC 7.8 7.5 8.7 13.5* 10.[name]  NEUTROABS 4.6 4.5  --   --   --   HGB 9.0* 9.[name]* 10.[name]* 10.3* 9.[name]*  HCT  30.2* 31.5* 33.4* 34.2* 30.7*  MCV 86.3 87.3 87.0 84.9 86.2  PLT 251 280 262 254 Q000111Q   Basic Metabolic Panel:  Recent Labs Lab 11/20/15 0409 11/21/15 0426 11/22/15 0413 11/23/15 0541 11/24/15 0530 11/26/15 0459  NA 139 138 136 136 131* 134*  K 3.4* 4.0 4.2 4.4 5.5* 4.9  CL 98* 98* 98* 96* 92* 96*  CO2 30 31 28 29 30 29   GLUCOSE 103* 115* 152* 153* 150* 139*  BUN 10 11 13 12 20 20   CREATININE [name]07 [name]08 [name]11 0.99 [name]59* [name]25*  CALCIUM 9.4 9.2 9.7 10.[name] 9.5 9.3  MG [name]9 2.[name] 2.0  --   --   --   PHOS 4.9* 4.9* 5.0* 5.2* 5.4*  --    GFR: Estimated Creatinine Clearance: 74.6 mL/min (by C-G formula based on Cr of [name]25).  Liver Function Tests:  Recent Labs Lab 11/20/15 0409 11/21/15 0426 11/22/15 0413 11/23/15 0541 11/24/15 0530  AST 15 15  --   --   --   ALT 16* 15*  --   --   --   ALKPHOS 76 80  --   --   --   BILITOT 0.3 0.3  --   --   --   PROT 6.9 6.9  --   --   --   ALBUMIN 2.5* 2.7* 2.8* 2.9* 2.9*    HbA1C: HGB A1C MFR BLD  Date/Time Value Ref Range Status  11/16/2015 04:00 AM 7.2* 4.8 - 5.6 % Final    Comment:    (NOTE)         Pre-diabetes: 5.7 - 6.4         Diabetes: >6.4         Glycemic control for adults with diabetes: <7.0   04/14/2012 05:35 AM 7.5* <5.7 % Final    Comment:    (NOTE)                                                                       According to the ADA Clinical Practice Recommendations for 2011, when HbA1c is used as a screening test:  >=6.5%   Diagnostic of Diabetes Mellitus           (if abnormal result is confirmed) 5.7-6.4%   Increased risk of developing Diabetes Mellitus References:Diagnosis and Classification of Diabetes Mellitus,Diabetes [id number] [name]):S62-S69 and Standards of Medical Care in         Diabetes - 2011,Diabetes Care,2011,34 (Suppl [name]):S11-S61.    CBG:  Recent Labs Lab 11/25/15 0620 11/25/15 1117 11/25/15 2053 11/25/15 2321 11/26/15 0633  GLUCAP 128* 88 153* 160* 121*    Recent Results  (from the past 240 hour(s))  Culture, blood (Routine X 2) w Reflex to ID Panel  Status: None   Collection Time: 11/19/15 11:35 AM  Result Value Ref Range Status   Specimen Description BLOOD RIGHT ANTECUBITAL  Final   Special Requests BOTTLES DRAWN AEROBIC AND ANAEROBIC 10CC  Final   Culture NO GROWTH 5 DAYS  Final   Report Status 11/24/2015 FINAL  Final  Culture, blood (Routine X 2) w Reflex to ID Panel     Status: None   Collection Time: 11/19/15 11:39 AM  Result Value Ref Range Status   Specimen Description BLOOD RIGHT ANTECUBITAL  Final   Special Requests IN PEDIATRIC BOTTLE  0.5CC  Final   Culture NO GROWTH 5 DAYS  Final   Report Status 11/24/2015 FINAL  Final  Culture, Urine     Status: None   Collection Time: 11/19/15  [name]:21 PM  Result Value Ref Range Status   Specimen Description URINE, RANDOM  Final   Special Requests NONE  Final   Culture NO GROWTH  Final   Report Status 11/20/2015 FINAL  Final     Scheduled Meds: . collagenase   Topical Daily  . feeding supplement (ENSURE ENLIVE)  237 mL Oral TID BM  . insulin aspart  0-20 Units Subcutaneous TID WC  . insulin aspart  0-5 Units Subcutaneous QHS  . insulin glargine  15 Units Subcutaneous QHS  . magnesium oxide  400 mg Oral BID  . oxyCODONE  80 mg Oral Q12H  . pantoprazole  40 mg Oral Daily  . PARoxetine  40 mg Oral Daily  . polyethylene glycol  17 g Oral BID  . pregabalin  75 mg Oral Q8H  . senna-docusate  [name] tablet Oral BID  . sodium chloride flush  10-40 mL Intracatheter Q12H  . sorbitol  15 mL Oral BID  . tamsulosin  0.4 mg Oral Daily  . vancomycin  [name],000 mg Intravenous Q24H     LOS: 19 days   Time spent: 35 minutes   Cherene Altes, MD [facility] Office  [phone] Pager - Text Page per Amion as per below:  On-Call/Text Page:      [email or web address]      password [email or web address]  If 7PM-7AM, please contact night-coverage [email or web address] Password [facility] 11/26/2015, 9:41 AM

## 2015-11-26 NOTE — Progress Notes (Signed)
md at bedside

## 2015-11-26 NOTE — Progress Notes (Signed)
rn called and gave daughter who is emergency contact update and that pt was transferred to 2 central.

## 2015-11-26 NOTE — Progress Notes (Signed)
MD made awre, pt c/o nausea, no PRNs available. New order received for anti-nausea medication.

## 2015-11-27 LAB — CBC
HEMATOCRIT: 30.2 % — AB (ref 39.0–52.0)
HEMOGLOBIN: 9.4 g/dL — AB (ref 13.0–17.0)
MCH: 26.3 pg (ref 26.0–34.0)
MCHC: 31.1 g/dL (ref 30.0–36.0)
MCV: 84.4 fL (ref 78.0–100.0)
Platelets: 229 10*3/uL (ref 150–400)
RBC: 3.58 MIL/uL — AB (ref 4.22–5.81)
RDW: 16.7 % — ABNORMAL HIGH (ref 11.5–15.5)
WBC: 12.4 10*3/uL — AB (ref 4.0–10.5)

## 2015-11-27 LAB — COMPREHENSIVE METABOLIC PANEL
ALK PHOS: 101 U/L (ref 38–126)
ALT: 12 U/L — ABNORMAL LOW (ref 17–63)
ANION GAP: 9 (ref 5–15)
AST: 12 U/L — ABNORMAL LOW (ref 15–41)
Albumin: 2.6 g/dL — ABNORMAL LOW (ref 3.5–5.0)
BILIRUBIN TOTAL: 0.4 mg/dL (ref 0.3–1.2)
BUN: 20 mg/dL (ref 6–20)
CALCIUM: 9.3 mg/dL (ref 8.9–10.3)
CO2: 29 mmol/L (ref 22–32)
Chloride: 93 mmol/L — ABNORMAL LOW (ref 101–111)
Creatinine, Ser: 1.27 mg/dL — ABNORMAL HIGH (ref 0.61–1.24)
GFR calc non Af Amer: >60 mL/min (ref >60)
Glucose, Bld: 137 mg/dL — ABNORMAL HIGH (ref 65–99)
POTASSIUM: 4.1 mmol/L (ref 3.5–5.1)
Sodium: 131 mmol/L — ABNORMAL LOW (ref 135–145)
TOTAL PROTEIN: 7.1 g/dL (ref 6.5–8.1)

## 2015-11-27 LAB — GLUCOSE, CAPILLARY
GLUCOSE-CAPILLARY: 175 mg/dL — AB (ref 65–99)
GLUCOSE-CAPILLARY: 122 mg/dL — AB (ref 65–99)
GLUCOSE-CAPILLARY: 142 mg/dL — AB (ref 65–99)
GLUCOSE-CAPILLARY: 227 mg/dL — AB (ref 65–99)

## 2015-11-27 MED ORDER — INSULIN ASPART 100 UNIT/ML ~~LOC~~ SOLN
0.0000 [IU] | Freq: Three times a day (TID) | SUBCUTANEOUS | Status: DC
  Administered 2015-11-27: 3 [IU] via SUBCUTANEOUS
  Administered 2015-11-27: 4 [IU] via SUBCUTANEOUS
  Administered 2015-11-27: 3 [IU] via SUBCUTANEOUS
  Administered 2015-11-28: 4 [IU] via SUBCUTANEOUS
  Administered 2015-11-28: 7 [IU] via SUBCUTANEOUS
  Administered 2015-11-30 (×2): 4 [IU] via SUBCUTANEOUS
  Administered 2015-11-30 – 2015-12-01 (×3): 3 [IU] via SUBCUTANEOUS
  Administered 2015-12-02: 4 [IU] via SUBCUTANEOUS
  Administered 2015-12-02: 3 [IU] via SUBCUTANEOUS

## 2015-11-27 MED ORDER — OXYCODONE HCL ER 20 MG PO T12A
60.0000 mg | EXTENDED_RELEASE_TABLET | Freq: Two times a day (BID) | ORAL | Status: DC
  Administered 2015-11-27 – 2015-12-02 (×10): 60 mg via ORAL
  Filled 2015-11-27 (×10): qty 3

## 2015-11-27 MED ORDER — SODIUM CHLORIDE 0.9 % IV SOLN
INTRAVENOUS | Status: DC
  Administered 2015-11-27: 10:00:00 via INTRAVENOUS

## 2015-11-27 NOTE — Progress Notes (Signed)
Gregory [name] TEAM 1 - Stepdown/ICU TEAM  Gregory [name]  GJ:[id number] DOB: 1961/05/17 DOA: 11/07/2015 PCP: Tamsen Roers, MD    Brief Narrative:  55 yo M Hx DM, HTN, COPD, DDD, Chronic Urinary retention, and MRSA bacteremia w/ C-spine L-spine L foot and L shoulder osteo initially seen at [facility] (Feb 2017) where surgery was contemplated. Daughter unsure why surgery not undertaken but long-term antibiotics were prescribed. Patient received 2 months IV antibiotics (completed course 1 May) and was discharged to [facility].  The pt was subsequently brought to ER 5/22 with altered mental status. He was hypoxic, diaphoretic. He was intubated for airway protection. He had purulent appearing urine. He was started on antibiotics, IV fluids, and pressors.  He was found to have an E coli UTI.  He remained on vancomycin for osteomyelitis/discitis, as well as newly appreciated MRSA pneumonia/tracheobronchitis.   Subjective: Oxygen sats have been stable since transfer to SDU. CXR was suggestive of simple atx. The pt has not new complaints today, and is alert and conversant.  He feels he has regained some strength in his legs, with the R being stronger than the L.  He denies cp, n/v, or abdom pain.  He has been expectorating some benign appearing clear phlegm.       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  -remains on vancomycin - Neurosurgery following - s/p stage 1 of 2 stage cervical decompression/stabilization - awaiting f/u surgery this week   Acute hypoxia -likely due to atelectasis/poor inspiratory effort - improved / stable - cont O2 support - up to chair - encouraged IS use   Bilateral lower extremity weakness / bilateral foot drop -due to above - NS following - appears to be improving   Sepsis due to MRSA Bacteremia w/ Pneumonia/Tracheobronchitis/Osteolyelitis  -abx per ID - Vanc continues   E coli UTI -completed 6 day course of antibiotics - f/u urine cx no growth   Pain  management / withdrawal from high-dose opioids -stable w/ pain reasonably controlled - avoid escalating doses of narcotics given atx and poor inspiratory effort   Chronic Diastolic CHF -TTE 6/4 noted EF 55-60% and grade 1 DD - no volume overload on exam  Filed Weights   11/25/15 0400 11/26/15 0528 11/27/15 0423  Weight: 94.348 kg (208 lb) 91.627 kg (202 lb) 92.987 kg (205 lb)    COPD -well compensated   HTN  -controlled   Acute Renal Failure  -baseline Cr 0.98 - follow trend while intake poor - gently hydrate   Chronic Urinary Retention -likely secondary to spine abscess and bladder damage secondary chronic urinary retention/distention - daughter confirms pt is followed by a Urologist as outpt   Anemia -likely due to renal failure and chronic smoldering infection - Hgb stable     DM2 -5/31A1c 7.2 - CBG not yet at goal - adjust tx and follow   Hypokalemia -corrected   Hypomagnesemia -corrected   DVT prophylaxis: SCDs Code Status: FULL CODE Family Communication: no family present at time of exam  Disposition Plan: stable to transfer back to neuro tele bed - awaiting 2nd part of staged cervical spine surgery    Consultants:  ID Neurosurgery   Procedures: 6/4 TTE EF 55-60% - grade 1 diastolic dysfunction 6/6 PICC placed  6/9 anterior approach cervical spine surgery   Antimicrobials:  Zosyn 5/21 Cefepime 5/22 - 5/25 Rocephin 5/25 > 5/26 Vancomycin 5/22 >  Objective: Blood pressure 115/91, pulse 105, temperature 98.2 F (36.8 C), temperature source  Oral, resp. rate 19, height 6\' 1"  (1.854 m), weight 92.987 kg (205 lb), SpO2 96 %.  Intake/Output Summary (Last 24 hours) at 11/27/15 1001 Last data filed at 11/27/15 0600  Gross per 24 hour  Intake 2783.25 ml  Output   2875 ml  Net -91.75 ml   Filed Weights   11/25/15 0400 11/26/15 0528 11/27/15 0423  Weight: 94.348 kg (208 lb) 91.627 kg (202 lb) 92.987 kg (205 lb)    Examination: General: no acute resp  distress Lungs: improved air movement th/o - no wheeze - no focal crackles  Cardiovascular: regular rate and rhythm w/o M Abdomen: soft, bowel sounds positive, no ascites, nontender Extremities: trace edema bilateral lower extremities  CBC:  Recent Labs Lab 11/21/15 0426 11/22/15 0413 11/24/15 0530 11/26/15 0459 11/27/15 0200  WBC 7.5 8.7 13.5* 10.1 12.4*  NEUTROABS 4.5  --   --   --   --   HGB 9.1* 10.1* 10.3* 9.1* 9.4*  HCT 31.5* 33.4* 34.2* 30.7* 30.2*  MCV 87.3 87.0 84.9 86.2 84.4  PLT 280 262 254 214 Q000111Q   Basic Metabolic Panel:  Recent Labs Lab 11/21/15 0426 11/22/15 0413 11/23/15 0541 11/24/15 0530 11/26/15 0459 11/27/15 0200  NA 138 136 136 131* 134* 131*  K 4.0 4.2 4.4 5.5* 4.9 4.1  CL 98* 98* 96* 92* 96* 93*  CO2 31 28 29 30 29 29   GLUCOSE 115* 152* 153* 150* 139* 137*  BUN 11 13 12 20 20 20   CREATININE 1.08 1.11 0.99 1.59* 1.25* 1.27*  CALCIUM 9.2 9.7 10.1 9.5 9.3 9.3  MG 2.1 2.0  --   --   --   --   PHOS 4.9* 5.0* 5.2* 5.4*  --   --    GFR: Estimated Creatinine Clearance: 73.4 mL/min (by C-G formula based on Cr of 1.27).  Liver Function Tests:  Recent Labs Lab 11/21/15 0426 11/22/15 0413 11/23/15 0541 11/24/15 0530 11/27/15 0200  AST 15  --   --   --  12*  ALT 15*  --   --   --  12*  ALKPHOS 80  --   --   --  101  BILITOT 0.3  --   --   --  0.4  PROT 6.9  --   --   --  7.1  ALBUMIN 2.7* 2.8* 2.9* 2.9* 2.6*    HbA1C: HGB A1C MFR BLD  Date/Time Value Ref Range Status  11/16/2015 04:00 AM 7.2* 4.8 - 5.6 % Final    Comment:    (NOTE)         Pre-diabetes: 5.7 - 6.4         Diabetes: >6.4         Glycemic control for adults with diabetes: <7.0   04/14/2012 05:35 AM 7.5* <5.7 % Final    Comment:    (NOTE)                                                                       According to the ADA Clinical Practice Recommendations for 2011, when HbA1c is used as a screening test:  >=6.5%   Diagnostic of Diabetes Mellitus           (if  abnormal  result is confirmed) 5.7-6.4%   Increased risk of developing Diabetes Mellitus References:Diagnosis and Classification of Diabetes Mellitus,Diabetes [id number] 1):S62-S69 and Standards of Medical Care in         Diabetes - 2011,Diabetes [id number] (Suppl 1):S11-S61.    CBG:  Recent Labs Lab 11/26/15 0951 11/26/15 1218 11/26/15 1622 11/26/15 2111 11/27/15 0809  GLUCAP 199* 199* 191* 240* 175*    Recent Results (from the past 240 hour(s))  Culture, blood (Routine X 2) w Reflex to ID Panel     Status: None   Collection Time: 11/19/15 11:35 AM  Result Value Ref Range Status   Specimen Description BLOOD RIGHT ANTECUBITAL  Final   Special Requests BOTTLES DRAWN AEROBIC AND ANAEROBIC 10CC  Final   Culture NO GROWTH 5 DAYS  Final   Report Status 11/24/2015 FINAL  Final  Culture, blood (Routine X 2) w Reflex to ID Panel     Status: None   Collection Time: 11/19/15 11:39 AM  Result Value Ref Range Status   Specimen Description BLOOD RIGHT ANTECUBITAL  Final   Special Requests IN PEDIATRIC BOTTLE  0.5CC  Final   Culture NO GROWTH 5 DAYS  Final   Report Status 11/24/2015 FINAL  Final  Culture, Urine     Status: None   Collection Time: 11/19/15  1:21 PM  Result Value Ref Range Status   Specimen Description URINE, RANDOM  Final   Special Requests NONE  Final   Culture NO GROWTH  Final   Report Status 11/20/2015 FINAL  Final     Scheduled Meds: . collagenase   Topical Daily  . feeding supplement (ENSURE ENLIVE)  237 mL Oral TID BM  . insulin aspart  0-20 Units Subcutaneous TID WC  . insulin aspart  0-5 Units Subcutaneous QHS  . insulin glargine  10 Units Subcutaneous QHS  . magnesium oxide  400 mg Oral BID  . oxyCODONE  80 mg Oral Q12H  . pantoprazole  40 mg Oral Daily  . PARoxetine  40 mg Oral Daily  . polyethylene glycol  17 g Oral BID  . pregabalin  75 mg Oral Q8H  . senna-docusate  1 tablet Oral BID  . sodium chloride flush  10-40 mL Intracatheter Q12H   . sorbitol  15 mL Oral BID  . tamsulosin  0.4 mg Oral Daily  . vancomycin  1,000 mg Intravenous Q24H     LOS: 20 days   Time spent: 35 minutes   Cherene Altes, MD [facility] Office  [phone] Pager - Text Page per Amion as per below:  On-Call/Text Page:      [email or web address]      password [email or web address]  If 7PM-7AM, please contact night-coverage [email or web address] Password [email or web address] 11/27/2015, 10:01 AM

## 2015-11-27 NOTE — Progress Notes (Signed)
Patient's oxygen saturation has been fine on nasal cannula all night.  Patient is more appropriate for a med-surg/telemetry bed at this time.

## 2015-11-27 NOTE — Progress Notes (Signed)
Patient no longer febrile and has transitioned successfully to nasal cannula at 3 liters.

## 2015-11-27 NOTE — Progress Notes (Signed)
Patient ID: Gregory [name], male   DOB: 21-Jul-1960, 55 y.o.   MRN: [id number] BP 110/74 mmHg  Pulse 103  Temp(Src) 98.2 F (36.8 C) (Oral)  Resp 12  Ht 6\' 1"  (1.854 m)  Wt 92.987 kg (205 lb)  BMI 27.05 kg/m2  SpO2 92% Alert, following all commands Wound is clean, and dry Moving all extremities, though certainly weak throughout Continue abx Posterior approach tomorrow.

## 2015-11-28 ENCOUNTER — Encounter (HOSPITAL_COMMUNITY): Payer: Self-pay | Admitting: Neurosurgery

## 2015-11-28 ENCOUNTER — Inpatient Hospital Stay (HOSPITAL_COMMUNITY): Payer: Medicaid Other

## 2015-11-28 LAB — RENAL FUNCTION PANEL
Albumin: 2.4 g/dL — ABNORMAL LOW (ref 3.5–5.0)
Anion gap: 11 (ref 5–15)
BUN: 17 mg/dL (ref 6–20)
CHLORIDE: 93 mmol/L — AB (ref 101–111)
CO2: 27 mmol/L (ref 22–32)
CREATININE: 1.03 mg/dL (ref 0.61–1.24)
Calcium: 9.3 mg/dL (ref 8.9–10.3)
GFR calc non Af Amer: >60 mL/min (ref >60)
GLUCOSE: 179 mg/dL — AB (ref 65–99)
Phosphorus: 4.1 mg/dL (ref 2.5–4.6)
Potassium: 3.8 mmol/L (ref 3.5–5.1)
SODIUM: 131 mmol/L — AB (ref 135–145)

## 2015-11-28 LAB — GLUCOSE, CAPILLARY
Glucose-Capillary: 170 mg/dL — ABNORMAL HIGH (ref 65–99)
Glucose-Capillary: 205 mg/dL — ABNORMAL HIGH (ref 65–99)
Glucose-Capillary: 116 mg/dL — ABNORMAL HIGH (ref 65–99)
Glucose-Capillary: 116 mg/dL — ABNORMAL HIGH (ref 65–99)

## 2015-11-28 LAB — VANCOMYCIN, TROUGH: Vancomycin Tr: 12 ug/mL (ref 10.0–20.0)

## 2015-11-28 LAB — POCT I-STAT 4, (NA,K, GLUC, HGB,HCT)
GLUCOSE: 147 mg/dL — AB (ref 65–99)
HEMATOCRIT: 30 % — AB (ref 39.0–52.0)
Hemoglobin: 10.2 g/dL — ABNORMAL LOW (ref 13.0–17.0)
POTASSIUM: 4.4 mmol/L (ref 3.5–5.1)
SODIUM: 134 mmol/L — AB (ref 135–145)

## 2015-11-28 MED ORDER — SALINE SPRAY 0.65 % NA SOLN
1.0000 | NASAL | Status: DC | PRN
  Administered 2015-11-28 (×2): 1 via NASAL
  Filled 2015-11-28: qty 44

## 2015-11-28 MED ORDER — ENSURE ENLIVE PO LIQD
237.0000 mL | Freq: Two times a day (BID) | ORAL | Status: DC
  Administered 2015-11-30 – 2015-12-01 (×2): 237 mL via ORAL
  Filled 2015-11-28 (×8): qty 237

## 2015-11-28 MED ORDER — PRO-STAT SUGAR FREE PO LIQD
30.0000 mL | Freq: Two times a day (BID) | ORAL | Status: DC
  Administered 2015-11-30 – 2015-12-02 (×3): 30 mL via ORAL
  Filled 2015-11-28 (×7): qty 30

## 2015-11-28 MED ORDER — VANCOMYCIN HCL 10 G IV SOLR
1500.0000 mg | INTRAVENOUS | Status: DC
  Administered 2015-11-28 – 2015-12-01 (×3): 1500 mg via INTRAVENOUS
  Filled 2015-11-28 (×5): qty 1500

## 2015-11-28 MED ORDER — HEPARIN SODIUM (PORCINE) 5000 UNIT/ML IJ SOLN
5000.0000 [IU] | Freq: Two times a day (BID) | INTRAMUSCULAR | Status: DC
  Administered 2015-11-28 – 2015-12-02 (×7): 5000 [IU] via SUBCUTANEOUS
  Filled 2015-11-28 (×8): qty 1

## 2015-11-28 MED ORDER — ADULT MULTIVITAMIN W/MINERALS CH
1.0000 | ORAL_TABLET | Freq: Every day | ORAL | Status: DC
  Administered 2015-11-30 – 2015-12-02 (×3): 1 via ORAL
  Filled 2015-11-28 (×3): qty 1

## 2015-11-28 MED ORDER — BOOST / RESOURCE BREEZE PO LIQD
1.0000 | ORAL | Status: DC
  Administered 2015-11-28 – 2015-12-01 (×4): 1 via ORAL
  Filled 2015-11-28 (×4): qty 1

## 2015-11-28 NOTE — Progress Notes (Signed)
CSW spoke with patient's daughter. She stated that patient had lost his insurance through the marketplace due to not submitting financial updates. Patient has a Medicaid application pending that was started at [facility]. Patient's daughter is tearful in explaining that patient's renters will not allow him to return. She is unable to care for patient and he requires a lot of care. She is unable to afford private paying. CSW to continue to follow.  Percell Locus Zemirah Krasinski LCSWA [phone]

## 2015-11-28 NOTE — Progress Notes (Signed)
Pharmacy Antibiotic Note  Gregory [name] is a 55 y.o. male admitted on 11/07/2015 with altered mental status.   He was found to have recurrent MRSA osteo/discitis and continues on Vancomycin.  Plan for part 2 of staged surgery 6/13.    Vancomycin level ordered by MD and drawn at 1245.  Can be used to estimate trough since dose due at 1400.  Vanc level < goal (15-20).  Renal function has improved back to baseline with SCr 1.  Plan: Increase Vancomycin to 1500 mg IV q24h for estimated trough 18 Follow-up SCr, BMET    Height: 6\' 1"  (185.4 cm) Weight: 207 lb 3.7 oz (94 kg) IBW/kg (Calculated) : 79.9  Temp (24hrs), Avg:98.7 F (37.1 C), Min:97.7 F (36.5 C), Max:99.9 F (37.7 C)   Recent Labs Lab 11/22/15 0413  11/23/15 0541 11/24/15 0530 11/24/15 1020 11/25/15 1225 11/26/15 0459 11/27/15 0200 11/28/15 0439 11/28/15 1245  WBC 8.7  --   --  13.5*  --   --  10.1 12.4*  --   --   CREATININE 1.11  --  0.99 1.59*  --   --  1.25* 1.27* 1.03  --   VANCOTROUGH  --   < >  --   --  36*  --   --   --   --  12  VANCORANDOM  --   --   --   --   --  17  --   --   --   --   < > = values in this interval not displayed.  Estimated Creatinine Clearance: 90.5 mL/min (by C-G formula based on Cr of 1.03).    No Known Allergies  Antimicrobials this admission: 5/22 Vanc >>5/24; 5/26>> (7/21? - 8 weeks per ID) 5/22 Cefepime >>5/24 5/24 Ceftriaxone>>5/26  Dose adjustments this admission: - 5/30 VT 15 on 750 q12h -recheck next week - 6/6 VT 25 (but drawn 2 hours 45 mins early) so true trough is actually ~ 20 - 6/8 VT 36 >> doses held, will recheck a VR in 24 hours - 6/9 VT 17 >> pt specific ke~0.029, (estimated CrCl~30 ml/min based on kinetics) >> restart Vanc 1g/24h - 6/12 VT 12 (ordered by MD, drawn at 1245 prior to 1400 dose) >> dose adjusted to 1500 mg q24h   Microbiology results: 5/22 BCx x2: negF 5/22 UCx: > 100k ecol MRSA PCR Pos 5/23 Urine >> negF  5/23 Blood x 2 NegF 5/25 BCx2>>  ngtd  5/25 Urine >> negF 5/26 tracheal - abundant MRSA 6/3 blood x2 >> negF 6/3 urine >> neg F   Thank you for allowing pharmacy to be a part of this patient's care.  [employer], Pharm.D., BCPS Clinical Pharmacist Pager [phone] 11/28/2015 2:17 PM

## 2015-11-28 NOTE — Progress Notes (Signed)
[place] TEAM 1 - Stepdown/ICU TEAM  Gregory [name]  [id number] DOB: 12/09/1960 DOA: 11/07/2015 PCP: Tamsen Roers, MD    Brief Narrative:  55 yo M Hx DM, HTN, COPD, DDD, Chronic Urinary retention, and MRSA bacteremia w/ C-spine L-spine L foot and L shoulder osteo initially seen at [facility] (Feb 2017) where surgery was contemplated. Daughter unsure why surgery not undertaken but long-term antibiotics were prescribed. Patient received 2 months IV antibiotics (completed course 1 May) and was discharged to [facility].  The pt was subsequently brought to ER 5/22 with altered mental status. He was hypoxic, diaphoretic. He was intubated for airway protection. He had purulent appearing urine. He was started on antibiotics, IV fluids, and pressors.  He was found to have an E coli UTI.  He remained on vancomycin for osteomyelitis/discitis, as well as newly appreciated MRSA pneumonia/tracheobronchitis.   Subjective:  The pt has not new complaints today, and is alert and conversant .  He denies cp, n/v, or abdom pain.    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 - Antibiotics management per ID ,day 21/56 of vancomycin  Acute hypoxia -likely due to atelectasis/poor inspiratory effort - improved / stable - cont O2 support - up to chair - encouraged IS use   Bilateral lower extremity weakness / bilateral foot drop -due to above - NS following - appears to be improving   Sepsis due to MRSA Bacteremia w/ Pneumonia/Tracheobronchitis/osteomyelitis/discitis -abx per ID - Vanc continues   E coli UTI -completed 6 day course of antibiotics - f/u urine cx no growth   Pain management / withdrawal from high-dose opioids -stable w/ pain reasonably controlled - avoid escalating doses of narcotics given atx and poor inspiratory effort    Chronic Diastolic CHF -TTE 6/4 noted EF 55-60% and grade 1 DD - no volume overload on exam  Filed Weights   11/26/15 0528 11/27/15 0423 11/28/15 0500  Weight: 91.627 kg (202 lb) 92.987 kg (205 lb) 94 kg (207 lb 3.7 oz)    COPD -well compensated   HTN  -controlled   Acute Renal Failure  -baseline Cr 0.98 - follow trend while intake poor - gently hydrate   Chronic Urinary Retention -likely secondary to spine abscess and bladder damage secondary chronic urinary retention/distention - daughter confirms pt is followed by a Urologist as outpt   Anemia -likely due to renal failure and chronic smoldering infection - Hgb stable     DM2 -5/31A1c 7.2 - CBG not yet at goal - adjust tx and follow   Hypokalemia -corrected   Hypomagnesemia -corrected   DVT prophylaxis: SCDs/Subcutaneous heparin Code Status: FULL CODE Family Communication: no family present at time of exam  Disposition Plan: awaiting 2nd part of staged cervical spine surgery    Consultants:  ID Neurosurgery   Procedures: 6/4 TTE EF 55-60% - grade 1 diastolic dysfunction 6/6 PICC placed  6/9 anterior approach cervical spine surgery   Antimicrobials:  Zosyn 5/21 Cefepime 5/22 - 5/25 Rocephin 5/25 > 5/26 Vancomycin 5/22 >  Objective: Blood pressure 125/75, pulse 109, temperature 99.9 F (37.7 C), temperature source Axillary, resp. rate 20, height 6\' 1"  (1.854 m), weight 94 kg (207 lb 3.7 oz), SpO2 93 %.  Intake/Output Summary (Last 24 hours) at 11/28/15 1036 Last data filed at 11/28/15 0934  Gross per 24 hour  Intake  600 ml  Output   1200 ml  Net   -600 ml   Filed Weights   11/26/15 0528 11/27/15 0423 11/28/15 0500  Weight: 91.627 kg (202 lb) 92.987 kg (205 lb) 94 kg (207 lb 3.7 oz)    Examination: General: no acute resp distress Lungs: improved air movement th/o - no wheeze - no focal crackles  Cardiovascular: regular rate and rhythm w/o M Abdomen: soft, bowel sounds positive, no ascites,  nontender Extremities: trace edema bilateral lower extremities  CBC:  Recent Labs Lab 11/22/15 0413 11/24/15 0530 11/25/15 1653 11/26/15 0459 11/27/15 0200  WBC 8.7 13.5*  --  10.1 12.4*  HGB 10.1* 10.3* 10.2* 9.1* 9.4*  HCT 33.4* 34.2* 30.0* 30.7* 30.2*  MCV 87.0 84.9  --  86.2 84.4  PLT 262 254  --  214 [id number]   Basic Metabolic Panel:  Recent Labs Lab 11/22/15 0413 11/23/15 0541 11/24/15 0530 11/25/15 1653 11/26/15 0459 11/27/15 0200 11/28/15 0439  NA 136 136 131* 134* 134* 131* 131*  K 4.2 4.4 5.5* 4.4 4.9 4.1 3.8  CL 98* 96* 92*  --  96* 93* 93*  CO2 28 29 30   --  29 29 27   GLUCOSE 152* 153* 150* 147* 139* 137* 179*  BUN 13 12 20   --  20 20 17   CREATININE 1.11 0.99 1.59*  --  1.25* 1.27* 1.03  CALCIUM 9.7 10.1 9.5  --  9.3 9.3 9.3  MG 2.0  --   --   --   --   --   --   PHOS 5.0* 5.2* 5.4*  --   --   --  4.1   GFR: Estimated Creatinine Clearance: 90.5 mL/min (by C-G formula based on Cr of 1.03).  Liver Function Tests:  Recent Labs Lab 11/22/15 0413 11/23/15 0541 11/24/15 0530 11/27/15 0200 11/28/15 0439  AST  --   --   --  12*  --   ALT  --   --   --  12*  --   ALKPHOS  --   --   --  101  --   BILITOT  --   --   --  0.4  --   PROT  --   --   --  7.1  --   ALBUMIN 2.8* 2.9* 2.9* 2.6* 2.4*    HbA1C: HGB A1C MFR BLD  Date/Time Value Ref Range Status  11/16/2015 04:00 AM 7.2* 4.8 - 5.6 % Final    Comment:    (NOTE)         Pre-diabetes: 5.7 - 6.4         Diabetes: >6.4         Glycemic control for adults with diabetes: <7.0   04/14/2012 05:35 AM 7.5* <5.7 % Final    Comment:    (NOTE)                                                                       According to the ADA Clinical Practice Recommendations for 2011, when HbA1c is used as a screening test:  >=6.5%   Diagnostic of Diabetes Mellitus           (if abnormal result is confirmed) 5.7-6.4%   Increased  risk of developing Diabetes Mellitus References:Diagnosis and Classification of  Diabetes Mellitus,Diabetes [id number] 1):S62-S69 and Standards of Medical Care in         Diabetes - 2011,Diabetes [id number] (Suppl 1):S11-S61.    CBG:  Recent Labs Lab 11/27/15 0809 11/27/15 1208 11/27/15 1602 11/27/15 2102 11/28/15 0601  GLUCAP 175* 122* 142* 227* 170*    Recent Results (from the past 240 hour(s))  Culture, blood (Routine X 2) w Reflex to ID Panel     Status: None   Collection Time: 11/19/15 11:35 AM  Result Value Ref Range Status   Specimen Description BLOOD RIGHT ANTECUBITAL  Final   Special Requests BOTTLES DRAWN AEROBIC AND ANAEROBIC 10CC  Final   Culture NO GROWTH 5 DAYS  Final   Report Status 11/24/2015 FINAL  Final  Culture, blood (Routine X 2) w Reflex to ID Panel     Status: None   Collection Time: 11/19/15 11:39 AM  Result Value Ref Range Status   Specimen Description BLOOD RIGHT ANTECUBITAL  Final   Special Requests IN PEDIATRIC BOTTLE  0.5CC  Final   Culture NO GROWTH 5 DAYS  Final   Report Status 11/24/2015 FINAL  Final  Culture, Urine     Status: None   Collection Time: 11/19/15  1:21 PM  Result Value Ref Range Status   Specimen Description URINE, RANDOM  Final   Special Requests NONE  Final   Culture NO GROWTH  Final   Report Status 11/20/2015 FINAL  Final     Scheduled Meds: . collagenase   Topical Daily  . feeding supplement (ENSURE ENLIVE)  237 mL Oral TID BM  . insulin aspart  0-20 Units Subcutaneous TID WC  . insulin aspart  0-5 Units Subcutaneous QHS  . insulin glargine  10 Units Subcutaneous QHS  . magnesium oxide  400 mg Oral BID  . oxyCODONE  60 mg Oral Q12H  . pantoprazole  40 mg Oral Daily  . PARoxetine  40 mg Oral Daily  . polyethylene glycol  17 g Oral BID  . pregabalin  75 mg Oral Q8H  . senna-docusate  1 tablet Oral BID  . sodium chloride flush  10-40 mL Intracatheter Q12H  . sorbitol  15 mL Oral BID  . tamsulosin  0.4 mg Oral Daily  . vancomycin  1,000 mg Intravenous Q24H     LOS: 21 days   Time  spent: 25 minutes   Gregory Climes, MD [phone] [facility] Office  [phone] Pager - Text Page per Amion as per below:  On-Call/Text Page:      [email or web address]      password [email or web address]  If 7PM-7AM, please contact night-coverage [email or web address] Password [email or web address] 11/28/2015, 10:36 AM

## 2015-11-28 NOTE — Care Management Note (Signed)
Case Management Note  Patient Details  Name: Gregory [name] MRN: [id number] Date of Birth: [birth year]  Subjective/Objective:                    Action/Plan: Plan is for second part of surgery tomorrow. Pt continues on IV vancomycin. CM following for d/c needs.   Expected Discharge Date:  11/24/15               Expected Discharge Plan:  [place] (from [facility])  In-House Referral:  Clinical Social Work  Discharge planning Services  CM Consult  Post Acute Care Choice:  NA Choice offered to:  NA  DME Arranged:  N/A DME Agency:  NA  HH Arranged:  NA HH Agency:  NA  Status of Service:  Completed, signed off  Medicare Important Message Given:    Date Medicare IM Given:    Medicare IM give by:    Date Additional Medicare IM Given:    Additional Medicare Important Message give by:     If discussed at [place] of Stay Meetings, dates discussed:    Additional Comments:  Pollie Friar, RN 11/28/2015, 4:15 PM

## 2015-11-28 NOTE — Progress Notes (Signed)
No issues overnight. Pt has mild dysphagia, tolerating soft foods.  EXAM:  BP 125/75 mmHg  Pulse 109  Temp(Src) 99.9 F (37.7 C) (Axillary)  Resp 20  Ht 6\' 1"  (1.854 m)  Wt 94 kg (207 lb 3.7 oz)  BMI 27.35 kg/m2  SpO2 93%  Awake, alert, oriented  Speech fluent, appropriate  CN grossly intact  Stable quadriparesis Wound c/d/i  IMPRESSION:  55 y.o. 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  PLAN: - Will plan on proceeding with C3-7 laminectomy, lateral mass fusion tomorrow - NPO p MN - Will get AP/Lat c-spine XR today

## 2015-11-28 NOTE — Progress Notes (Signed)
Nutrition Follow-up  DOCUMENTATION CODES:   Obesity unspecified  INTERVENTION:  Continue Ensure Enlive po BID, each supplement provides 350 kcal and 20 grams of protein Provide Boost Breeze once daily, provides 250 kcal and 9 grams of protein Provide Pro-stat BID, each dose provides 100 kcal and 15 grams of protein Provide Multivitamin with minerals daily   NUTRITION DIAGNOSIS:   Increased nutrient needs related to wound healing (osteomyelitis, surgery ) as evidenced by estimated needs.  ongoing  GOAL:   Patient will meet greater than or equal to 90% of their needs  unmet  MONITOR:   PO intake, Supplement acceptance, Labs, Weight trends, Skin, I & O's  REASON FOR ASSESSMENT:   Consult Enteral/tube feeding initiation and management  ASSESSMENT:   55 yo male from NH with acute encephalopathy 2nd to septic shock from UTI, AKI, VDRF, and chronic opiate use. He has hx of MRSA bacteremia with diffuse areas of osteomyelitis in February 2017.   6/9 anterior approach cervical spine surgery  Plan for C3-7 laminectomy, lateral mass fusion tomorrow  [name] weight has dropped from 230 lbs to 200 lbs since admission, with weight currently at 207 lbs. Pt asleep at time of visit. He appears to have moderate muscle wasting of thighs, mild wasting of patellar region. Per nursing notes, pt is eating 25-50% of meals and accepting Ensure about 50% of the time. Ensure replaced with Boost Breeze one time per nursing notes. Pt with mild dysphagia, tolerating soft foods, per MD note.   Labs: elevated glucose ranging 122 to 205 mg/dL, low sodium, low chloride, low albumin, low hemoglobin  Diet Order:  Diet NPO time specified  Skin:  Wound (see comment) (Stage I pressure ulcer on buttocks; closed incision R neck)  Last BM:  6/11  Height:   Ht Readings from Last 1 Encounters:  11/18/15 6\' 1"  (1.854 m)    Weight:   Wt Readings from Last 1 Encounters:  11/28/15 207 lb 3.7 oz (94 kg)     Ideal Body Weight:  80.9 kg  BMI:  Body mass index is 27.35 kg/(m^2).  Estimated Nutritional Needs:   Kcal:  [id number]  Protein:  130-140 grams  Fluid:  2.3-2.5 L/day  EDUCATION NEEDS:   No education needs identified at this time  [place], LDN Inpatient Clinical Dietitian Pager: [phone] After Hours Pager: [phone]

## 2015-11-29 ENCOUNTER — Encounter (HOSPITAL_COMMUNITY): Payer: Self-pay | Admitting: Anesthesiology

## 2015-11-29 ENCOUNTER — Inpatient Hospital Stay (HOSPITAL_COMMUNITY): Payer: Medicaid Other | Admitting: Anesthesiology

## 2015-11-29 ENCOUNTER — Encounter (HOSPITAL_COMMUNITY)
Admission: EM | Disposition: A | Discharge status: Skilled Nursing Facility | Payer: Self-pay | Source: Home / Self Care | Attending: Pulmonary Disease

## 2015-11-29 ENCOUNTER — Inpatient Hospital Stay (HOSPITAL_COMMUNITY): Payer: Medicaid Other

## 2015-11-29 DIAGNOSIS — J15211 Pneumonia due to Methicillin susceptible Staphylococcus aureus: Secondary | ICD-10-CM

## 2015-11-29 HISTORY — PX: POSTERIOR CERVICAL FUSION/FORAMINOTOMY: SHX5038

## 2015-11-29 LAB — GLUCOSE, CAPILLARY
GLUCOSE-CAPILLARY: 108 mg/dL — AB (ref 65–99)
GLUCOSE-CAPILLARY: 159 mg/dL — AB (ref 65–99)
Glucose-Capillary: 108 mg/dL — ABNORMAL HIGH (ref 65–99)
Glucose-Capillary: 136 mg/dL — ABNORMAL HIGH (ref 65–99)

## 2015-11-29 LAB — BLOOD PRODUCT ORDER (VERBAL) VERIFICATION

## 2015-11-29 LAB — CBC
HCT: 27.6 % — ABNORMAL LOW (ref 39.0–52.0)
HEMOGLOBIN: 8.3 g/dL — AB (ref 13.0–17.0)
MCH: 25.7 pg — ABNORMAL LOW (ref 26.0–34.0)
MCHC: 30.1 g/dL (ref 30.0–36.0)
MCV: 85.4 fL (ref 78.0–100.0)
Platelets: 238 10*3/uL (ref 150–400)
RBC: 3.23 MIL/uL — ABNORMAL LOW (ref 4.22–5.81)
RDW: 16.3 % — AB (ref 11.5–15.5)
WBC: 7.2 10*3/uL (ref 4.0–10.5)

## 2015-11-29 LAB — BASIC METABOLIC PANEL
Anion gap: 10 (ref 5–15)
BUN: 12 mg/dL (ref 6–20)
CALCIUM: 9.7 mg/dL (ref 8.9–10.3)
CO2: 27 mmol/L (ref 22–32)
CREATININE: 0.86 mg/dL (ref 0.61–1.24)
Chloride: 100 mmol/L — ABNORMAL LOW (ref 101–111)
GFR calc Af Amer: >60 mL/min (ref >60)
Glucose, Bld: 110 mg/dL — ABNORMAL HIGH (ref 65–99)
Potassium: 3.7 mmol/L (ref 3.5–5.1)
SODIUM: 137 mmol/L (ref 135–145)

## 2015-11-29 SURGERY — POSTERIOR CERVICAL FUSION/FORAMINOTOMY LEVEL 4
Anesthesia: General

## 2015-11-29 MED ORDER — HYDROMORPHONE HCL 1 MG/ML IJ SOLN
0.2500 mg | INTRAMUSCULAR | Status: DC | PRN
  Administered 2015-11-29: 0.5 mg via INTRAVENOUS

## 2015-11-29 MED ORDER — MEPERIDINE HCL 25 MG/ML IJ SOLN: 6.2500 mg | INTRAMUSCULAR | Status: DC | PRN

## 2015-11-29 MED ORDER — PROPOFOL 10 MG/ML IV BOLUS
INTRAVENOUS | Status: AC
  Filled 2015-11-29: qty 20

## 2015-11-29 MED ORDER — HYDROMORPHONE HCL 1 MG/ML IJ SOLN
INTRAMUSCULAR | Status: AC
  Administered 2015-11-29: 0.5 mg via INTRAVENOUS
  Filled 2015-11-29: qty 1

## 2015-11-29 MED ORDER — PROMETHAZINE HCL 25 MG/ML IJ SOLN: 6.2500 mg | INTRAMUSCULAR | Status: DC | PRN

## 2015-11-29 MED ORDER — SUFENTANIL CITRATE 50 MCG/ML IV SOLN
INTRAVENOUS | Status: AC
  Filled 2015-11-29: qty 1

## 2015-11-29 MED ORDER — MIDAZOLAM HCL 2 MG/2ML IJ SOLN
INTRAMUSCULAR | Status: AC
  Filled 2015-11-29: qty 2

## 2015-11-29 MED ORDER — HEMOSTATIC AGENTS (NO CHARGE) OPTIME
TOPICAL | Status: DC | PRN
  Administered 2015-11-29: 1 via TOPICAL

## 2015-11-29 MED ORDER — ROCURONIUM BROMIDE 100 MG/10ML IV SOLN
INTRAVENOUS | Status: DC | PRN
  Administered 2015-11-29: 80 mg via INTRAVENOUS
  Administered 2015-11-29: 20 mg via INTRAVENOUS

## 2015-11-29 MED ORDER — KETOROLAC TROMETHAMINE 30 MG/ML IJ SOLN
30.0000 mg | Freq: Once | INTRAMUSCULAR | Status: AC
  Administered 2015-11-29: 30 mg via INTRAVENOUS

## 2015-11-29 MED ORDER — METOPROLOL TARTRATE 5 MG/5ML IV SOLN
INTRAVENOUS | Status: AC
  Filled 2015-11-29: qty 5

## 2015-11-29 MED ORDER — PROPOFOL 10 MG/ML IV BOLUS
INTRAVENOUS | Status: DC | PRN
  Administered 2015-11-29: 200 mg via INTRAVENOUS

## 2015-11-29 MED ORDER — ONDANSETRON HCL 4 MG/2ML IJ SOLN
INTRAMUSCULAR | Status: DC | PRN
  Administered 2015-11-29: 4 mg via INTRAVENOUS

## 2015-11-29 MED ORDER — DEXTROSE 5 % IV SOLN
10.0000 mg | INTRAVENOUS | Status: DC | PRN
  Administered 2015-11-29: 10 ug/min via INTRAVENOUS
  Administered 2015-11-29: 40 ug/min via INTRAVENOUS

## 2015-11-29 MED ORDER — LIDOCAINE 2% (20 MG/ML) 5 ML SYRINGE
INTRAMUSCULAR | Status: AC
  Filled 2015-11-29: qty 10

## 2015-11-29 MED ORDER — SUGAMMADEX SODIUM 200 MG/2ML IV SOLN
INTRAVENOUS | Status: DC | PRN
  Administered 2015-11-29: 200 mg via INTRAVENOUS

## 2015-11-29 MED ORDER — 0.9 % SODIUM CHLORIDE (POUR BTL) OPTIME
TOPICAL | Status: DC | PRN
  Administered 2015-11-29: 1000 mL

## 2015-11-29 MED ORDER — SUFENTANIL CITRATE 50 MCG/ML IV SOLN
INTRAVENOUS | Status: DC | PRN
  Administered 2015-11-29: 15 ug via INTRAVENOUS
  Administered 2015-11-29: 5 ug via INTRAVENOUS
  Administered 2015-11-29 (×4): 10 ug via INTRAVENOUS
  Administered 2015-11-29: 5 ug via INTRAVENOUS
  Administered 2015-11-29: 15 ug via INTRAVENOUS

## 2015-11-29 MED ORDER — SODIUM CHLORIDE 0.9 % IJ SOLN
INTRAMUSCULAR | Status: AC
  Filled 2015-11-29: qty 10

## 2015-11-29 MED ORDER — BUPIVACAINE HCL (PF) 0.5 % IJ SOLN
INTRAMUSCULAR | Status: DC | PRN
  Administered 2015-11-29: 5 mL

## 2015-11-29 MED ORDER — LIDOCAINE-EPINEPHRINE 1 %-1:100000 IJ SOLN
INTRAMUSCULAR | Status: DC | PRN
  Administered 2015-11-29: 5 mL

## 2015-11-29 MED ORDER — SUGAMMADEX SODIUM 200 MG/2ML IV SOLN
INTRAVENOUS | Status: AC
  Filled 2015-11-29: qty 2

## 2015-11-29 MED ORDER — METOPROLOL TARTRATE 5 MG/5ML IV SOLN
INTRAVENOUS | Status: DC | PRN
  Administered 2015-11-29: 1 mg via INTRAVENOUS

## 2015-11-29 MED ORDER — LACTATED RINGERS IV SOLN
INTRAVENOUS | Status: DC | PRN
  Administered 2015-11-29 (×3): via INTRAVENOUS

## 2015-11-29 MED ORDER — ROCURONIUM BROMIDE 50 MG/5ML IV SOLN
INTRAVENOUS | Status: AC
  Filled 2015-11-29: qty 1

## 2015-11-29 MED ORDER — THROMBIN 20000 UNITS EX KIT
PACK | CUTANEOUS | Status: DC | PRN
  Administered 2015-11-29: 20000 [IU] via TOPICAL

## 2015-11-29 MED ORDER — THROMBIN 5000 UNITS EX SOLR
OROMUCOSAL | Status: DC | PRN
  Administered 2015-11-29 (×2): via TOPICAL

## 2015-11-29 MED ORDER — ALBUMIN HUMAN 5 % IV SOLN
INTRAVENOUS | Status: DC | PRN
  Administered 2015-11-29: 16:00:00 via INTRAVENOUS

## 2015-11-29 MED ORDER — SODIUM CHLORIDE 0.9 % IR SOLN
Status: DC | PRN
  Administered 2015-11-29: 500 mL

## 2015-11-29 MED ORDER — ROCURONIUM BROMIDE 50 MG/5ML IV SOLN
INTRAVENOUS | Status: AC
Start: 2015-11-29 — End: 2015-11-29
  Filled 2015-11-29: qty 2

## 2015-11-29 SURGICAL SUPPLY — 63 items
APL SKNCLS STERI-STRIP NONHPOA (GAUZE/BANDAGES/DRESSINGS)
BAG DECANTER FOR FLEXI CONT (MISCELLANEOUS) ×2 IMPLANT
BENZOIN TINCTURE PRP APPL 2/3 (GAUZE/BANDAGES/DRESSINGS) IMPLANT
BIT DRILL 2.4X (BIT) ×1 IMPLANT
BIT DRL 2.4X (BIT) ×1
BLADE CLIPPER SURG (BLADE) ×2 IMPLANT
BLADE ULTRA TIP 2M (BLADE) IMPLANT
BUR MATCHSTICK NEURO 3.0 LAGG (BURR) ×2 IMPLANT
BUR PRECISION FLUTE 5.0 (BURR) ×2 IMPLANT
CANISTER SUCT 3000ML PPV (MISCELLANEOUS) ×2 IMPLANT
DECANTER SPIKE VIAL GLASS SM (MISCELLANEOUS) ×2 IMPLANT
DRAPE C-ARM 42X72 X-RAY (DRAPES) ×4 IMPLANT
DRAPE LAPAROTOMY 100X72 PEDS (DRAPES) ×2 IMPLANT
DRAPE POUCH INSTRU U-SHP 10X18 (DRAPES) ×2 IMPLANT
DRILL BIT (BIT) ×2
DRSG OPSITE POSTOP 4X6 (GAUZE/BANDAGES/DRESSINGS) ×2 IMPLANT
DURAPREP 6ML APPLICATOR 50/CS (WOUND CARE) ×2 IMPLANT
ELECT REM PT RETURN 9FT ADLT (ELECTROSURGICAL) ×2
ELECTRODE REM PT RTRN 9FT ADLT (ELECTROSURGICAL) ×1 IMPLANT
GAUZE SPONGE 4X4 12PLY STRL (GAUZE/BANDAGES/DRESSINGS) IMPLANT
GAUZE SPONGE 4X4 16PLY XRAY LF (GAUZE/BANDAGES/DRESSINGS) IMPLANT
GLOVE BIOGEL PI IND STRL 6.5 (GLOVE) ×1 IMPLANT
GLOVE BIOGEL PI IND STRL 7.5 (GLOVE) ×1 IMPLANT
GLOVE BIOGEL PI INDICATOR 6.5 (GLOVE) ×1
GLOVE BIOGEL PI INDICATOR 7.5 (GLOVE) ×1
GLOVE ECLIPSE 7.0 STRL STRAW (GLOVE) ×2 IMPLANT
GLOVE ECLIPSE 9.0 STRL (GLOVE) ×2 IMPLANT
GLOVE SS BIOGEL STRL SZ 7 (GLOVE) ×1 IMPLANT
GLOVE SUPERSENSE BIOGEL SZ 7 (GLOVE) ×1
GLOVE SURG SS PI 6.5 STRL IVOR (GLOVE) ×4 IMPLANT
GOWN STRL REUS W/ TWL LRG LVL3 (GOWN DISPOSABLE) ×2 IMPLANT
GOWN STRL REUS W/ TWL XL LVL3 (GOWN DISPOSABLE) ×2 IMPLANT
GOWN STRL REUS W/TWL 2XL LVL3 (GOWN DISPOSABLE) IMPLANT
GOWN STRL REUS W/TWL LRG LVL3 (GOWN DISPOSABLE) ×4
GOWN STRL REUS W/TWL XL LVL3 (GOWN DISPOSABLE) ×4
HEMOSTAT POWDER KIT SURGIFOAM (HEMOSTASIS) ×2 IMPLANT
KIT BASIN OR (CUSTOM PROCEDURE TRAY) ×2 IMPLANT
KIT ROOM TURNOVER OR (KITS) ×2 IMPLANT
NEEDLE HYPO 25X1 1.5 SAFETY (NEEDLE) ×2 IMPLANT
NS IRRIG 1000ML POUR BTL (IV SOLUTION) ×2 IMPLANT
PACK LAMINECTOMY NEURO (CUSTOM PROCEDURE TRAY) ×2 IMPLANT
PACK VITOSS BIOACTIVE 2.5CC (Neuro Prosthesis/Implant) ×2 IMPLANT
PAD ARMBOARD 7.5X6 YLW CONV (MISCELLANEOUS) ×12 IMPLANT
PIN MAYFIELD SKULL DISP (PIN) ×2 IMPLANT
PUTTY BONE DBX 5CC MIX (Putty) ×2 IMPLANT
ROD SPINAL VERTEX TI 3.5X80 (Rod) ×4 IMPLANT
SCREW 3.5X12 (Screw) ×4 IMPLANT
SCREW BN 12X3.5XMA SPNE (Screw) ×2 IMPLANT
SCREW CANCELLOUS 3.5X14MM (Screw) ×16 IMPLANT
SCREW SET M6 (Screw) ×20 IMPLANT
SPONGE LAP 4X18 X RAY DECT (DISPOSABLE) IMPLANT
SPONGE SURGIFOAM ABS GEL 100 (HEMOSTASIS) ×2 IMPLANT
STAPLER SKIN PROX WIDE 3.9 (STAPLE) ×2 IMPLANT
STRIP CLOSURE SKIN 1/2X4 (GAUZE/BANDAGES/DRESSINGS) IMPLANT
SUT ETHILON 3 0 FSL (SUTURE) IMPLANT
SUT VIC AB 0 CT1 18XCR BRD8 (SUTURE) ×4 IMPLANT
SUT VIC AB 0 CT1 8-18 (SUTURE) ×8
SUT VIC AB 2-0 CT1 18 (SUTURE) ×2 IMPLANT
SUT VICRYL 3-0 RB1 18 ABS (SUTURE) ×2 IMPLANT
TOWEL OR 17X24 6PK STRL BLUE (TOWEL DISPOSABLE) ×2 IMPLANT
TOWEL OR 17X26 10 PK STRL BLUE (TOWEL DISPOSABLE) ×2 IMPLANT
UNDERPAD 30X30 INCONTINENT (UNDERPADS AND DIAPERS) IMPLANT
WATER STERILE IRR 1000ML POUR (IV SOLUTION) ×2 IMPLANT

## 2015-11-29 NOTE — Anesthesia Procedure Notes (Signed)
Procedure Name: Intubation Date/Time: 11/29/2015 2:52 PM Performed by: Izora Gala Pre-anesthesia Checklist: Patient identified, Emergency Drugs available and Suction available Patient Re-evaluated:Patient Re-evaluated prior to inductionOxygen Delivery Method: Circle system utilized Preoxygenation: Pre-oxygenation with 100% oxygen Intubation Type: IV induction Ventilation: Mask ventilation without difficulty Laryngoscope Size: Glidescope and 2 Grade View: Grade I Tube type: Oral Tube size: 8.0 mm Number of attempts: 1 Airway Equipment and Method: Stylet and LTA kit utilized Placement Confirmation: ETT inserted through vocal cords under direct vision,  positive ETCO2,  CO2 detector and breath sounds checked- equal and bilateral Secured at: 22 cm Tube secured with: Tape Dental Injury: Teeth and Oropharynx as per pre-operative assessment

## 2015-11-29 NOTE — Transfer of Care (Signed)
Immediate Anesthesia Transfer of Care Note  Patient: Gregory [name]  Procedure(s) Performed: Procedure(s) with comments: Cervical Three-Cervical Seven Posterior Cervical Laminectomy with Fusion (N/A) - Cervical Three-Cervical Seven Posterior Cervical Laminectomy with Fusion  Patient Location: PACU  Anesthesia Type:General  Level of Consciousness: awake, pateint uncooperative and confused  Airway & Oxygen Therapy: Patient Spontanous Breathing, Patient connected to nasal cannula oxygen and Patient connected to face mask oxygen  Post-op Assessment: Report given to RN, Post -op Vital signs reviewed and stable, Post -op Vital signs reviewed and unstable, Anesthesiologist notified and Patient moving all extremities X 4  Post vital signs: Reviewed and stable  Last Vitals:  Filed Vitals:   11/29/15 0912 11/29/15 1300  BP: 109/75 119/82  Pulse: 90   Temp: 36.4 C   Resp: 16     Last Pain:  Filed Vitals:   11/29/15 1359  PainSc: Asleep      Patients Stated Pain Goal: 2 ([id number])  Complications: No apparent anesthesia complications

## 2015-11-29 NOTE — Progress Notes (Signed)
Patient offered repositioning, educated r/t sacral wound, still refused repositioning.

## 2015-11-29 NOTE — Progress Notes (Signed)
Dressing changed on toe per order, patient tolerated well. Prevalon boots ordered. Heels currently elevated.  Sacral foam dressing reapplied to sacral area. 2cm wound, blanchable, 0.1 cm deep, minimal drainage on sacrum. Attempted to reposition patient on left/right side, patient refused to be turned. Agreed to have pillow halfway under bottom. Will continue to monitor.

## 2015-11-29 NOTE — Anesthesia Postprocedure Evaluation (Signed)
Anesthesia Post Note  Patient: Gregory [name]  Procedure(s) Performed: Procedure(s) (LRB): Cervical Three-Cervical Seven Posterior Cervical Laminectomy with Fusion (N/A)  Patient location during evaluation: PACU Anesthesia Type: General Level of consciousness: awake and alert Pain management: pain level controlled Vital Signs Assessment: post-procedure vital signs reviewed and stable Respiratory status: spontaneous breathing, nonlabored ventilation, respiratory function stable and patient connected to nasal cannula oxygen Cardiovascular status: blood pressure returned to baseline and stable Postop Assessment: no signs of nausea or vomiting Anesthetic complications: no    Last Vitals:  Filed Vitals:   11/29/15 1919 11/29/15 1951  BP: 146/90 148/82  Pulse: 102   Temp:    Resp: 15     Last Pain:  Filed Vitals:   11/29/15 1952  PainSc: 8                  Isley Weisheit,W. EDMOND

## 2015-11-29 NOTE — Progress Notes (Signed)
Patient returned from PACU. Patient is alert, oriented x4. Suctioned self, equipment was changed. Patient refused to be repositioned, educated again, still refused. Refused prafo and prevalon boots. Educated, still refused. Patient put on clear liquid diet, given sips of water. Refuses to be put >40 degrees to take medications, educated, still refuses. Daughter aware that patient is back from surgery and alert, oriented. Call bell within patient's reach, will continue to monitor.

## 2015-11-29 NOTE — Progress Notes (Signed)
    [facility] for Infectious Disease   Reason for visit: Follow up on discitis  Interval History: plan for surgery again today.  Afebrile, no pain.    Physical Exam: Constitutional:  Filed Vitals:   11/29/15 0524 11/29/15 0912  BP: 129/79 109/75  Pulse: 96 90  Temp: 98 F (36.7 C) 97.5 F (36.4 C)  Resp: 18 16   patient appears in NAD Respiratory: Normal respiratory effort; CTA B Cardiovascular: RRR GI: soft, nt  Review of Systems: Constitutional: negative for fevers and chills Gastrointestinal: negative for diarrhea Musculoskeletal: negative for myalgias and arthralgias  Lab Results  Component Value Date   WBC 7.2 11/29/2015   HGB 8.3* 11/29/2015   HCT 27.6* 11/29/2015   MCV 85.4 11/29/2015   PLT 238 11/29/2015    Lab Results  Component Value Date   CREATININE 0.86 11/29/2015   BUN 12 11/29/2015   NA 137 11/29/2015   K 3.7 11/29/2015   CL 100* 11/29/2015   CO2 27 11/29/2015    Lab Results  Component Value Date   ALT 12* 11/27/2015   AST 12* 11/27/2015   ALKPHOS 101 11/27/2015     Microbiology: Recent Results (from the past 240 hour(s))  Culture, blood (Routine X 2) w Reflex to ID Panel     Status: None   Collection Time: 11/19/15 11:35 AM  Result Value Ref Range Status   Specimen Description BLOOD RIGHT ANTECUBITAL  Final   Special Requests BOTTLES DRAWN AEROBIC AND ANAEROBIC 10CC  Final   Culture NO GROWTH 5 DAYS  Final   Report Status 11/24/2015 FINAL  Final  Culture, blood (Routine X 2) w Reflex to ID Panel     Status: None   Collection Time: 11/19/15 11:39 AM  Result Value Ref Range Status   Specimen Description BLOOD RIGHT ANTECUBITAL  Final   Special Requests IN PEDIATRIC BOTTLE  0.5CC  Final   Culture NO GROWTH 5 DAYS  Final   Report Status 11/24/2015 FINAL  Final  Culture, Urine     Status: None   Collection Time: 11/19/15  1:21 PM  Result Value Ref Range Status   Specimen Description URINE, RANDOM  Final   Special Requests NONE   Final   Culture NO GROWTH  Final   Report Status 11/20/2015 FINAL  Final    Impression/Plan:  1. Discitis - day 23/56 of vancomycin.   2.  MRSA pneumonia - resolved, no current sob.   Will continue to follow but intermittently

## 2015-11-29 NOTE — Progress Notes (Addendum)
Gregory [name]  [id number] DOB: January 25, 1961 DOA: 11/07/2015 PCP: Tamsen Roers, MD    Brief Narrative:  55 yo M Hx DM, HTN, COPD, DDD, Chronic Urinary retention, and MRSA bacteremia w/ C-spine L-spine L foot and L shoulder osteo initially seen at [facility] (Feb 2017) where surgery was contemplated. Daughter unsure why surgery not undertaken but long-term antibiotics were prescribed. Patient received 2 months IV antibiotics (completed course 1 May) and was discharged to [facility].  The pt was subsequently brought to ER 5/22 with altered mental status. He was hypoxic, diaphoretic. He was intubated for airway protection. he had purulent appearing urine. He was started on antibiotics, IV fluids, and pressors.  He was found to have an E coli UTI.  He remained on vancomycin for osteomyelitis/discitis, as well as newly appreciated MRSA pneumonia/tracheobronchitis.   Subjective:  The pt has not new complaints today, and is alert and conversant .  He denies cp, n/v, or abdom pain.    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 Kathyrn Sheriff. - Antibiotics management per ID ,day 23/56 of vancomycin  Acute hypoxia -likely due to atelectasis/poor inspiratory effort - improved / stable - cont O2 support - up to chair - encouraged IS use   Bilateral lower extremity weakness / bilateral foot drop -due to above - NS following - appears to be improving   Sepsis due to MRSA Bacteremia w/ Pneumonia/Tracheobronchitis/osteomyelitis/discitis -abx per ID - Vanc continues   E coli UTI -completed 6 day course of antibiotics - f/u urine cx no growth   Pain management / withdrawal from high-dose opioids -stable w/ pain reasonably controlled - avoid escalating doses of narcotics given atx and poor  inspiratory effort   Chronic Diastolic CHF -TTE 6/4 noted EF 55-60% and grade 1 DD - no volume overload on exam  Filed Weights   11/27/15 0423 11/28/15 0500 11/29/15 0400  Weight: 92.987 kg (205 lb) 94 kg (207 lb 3.7 oz) 89.631 kg (197 lb 9.6 oz)    COPD -well compensated   HTN  -controlled   Acute Renal Failure  -baseline Cr 0.98 - follow trend while intake poor - gently hydrate   Chronic Urinary Retention -likely secondary to spine abscess and bladder damage secondary chronic urinary retention/distention - daughter confirms pt is followed by a Urologist as outpt   Anemia -likely due to renal failure and chronic smoldering infection - Hgb stable     DM2 -5/31A1c 7.2 - CBG not yet at goal - adjust tx and follow   Hypokalemia -corrected   Hypomagnesemia -corrected   DVT prophylaxis: SCDs/Subcutaneous heparin Code Status: FULL CODE Family Communication: no family present at time of exam  Disposition Plan: awaiting 2nd part of staged cervical spine surgery    Consultants:  ID Neurosurgery   Procedures: 6/4 TTE EF 55-60% - grade 1 diastolic dysfunction 6/6 PICC placed  6/9 anterior approach cervical spine surgery   Antimicrobials:  Zosyn 5/21 Cefepime 5/22 - 5/25 Rocephin 5/25 > 5/26 Vancomycin 5/22 >  Objective: Blood pressure 109/75, pulse 90, temperature 97.5 F (36.4 C), temperature source Oral, resp. rate 16, height 6\' 1"  (1.854 m), weight 89.631 kg (197 lb 9.6 oz), SpO2 97 %.  Intake/Output Summary (Last 24 hours) at 11/29/15 1352 Last data filed at 11/29/15 0748  Gross per 24 hour  Intake    500 ml  Output   3525 ml  Net  -3025 ml   Filed Weights   11/27/15 0423 11/28/15 0500 11/29/15 0400  Weight: 92.987 kg (205 lb) 94 kg (207 lb 3.7 oz) 89.631 kg (197 lb 9.6 oz)    Examination: General: no acute resp distress Lungs: improved air movement th/o - no wheeze - no focal crackles  Cardiovascular: regular rate and rhythm w/o M Abdomen: soft,  bowel sounds positive, no ascites, nontender Extremities: trace edema bilateral lower extremities  CBC:  Recent Labs Lab 11/24/15 0530 11/25/15 1653 11/26/15 0459 11/27/15 0200 11/29/15 0500  WBC 13.5*  --  10.1 12.4* 7.2  HGB 10.3* 10.2* 9.1* 9.4* 8.3*  HCT 34.2* 30.0* 30.7* 30.2* 27.6*  MCV 84.9  --  86.2 84.4 85.4  PLT 254  --  214 229 [id number]   Basic Metabolic Panel:  Recent Labs Lab 11/23/15 0541 11/24/15 0530 11/25/15 1653 11/26/15 0459 11/27/15 0200 11/28/15 0439 11/29/15 0500  NA 136 131* 134* 134* 131* 131* 137  K 4.4 5.5* 4.4 4.9 4.1 3.8 3.7  CL 96* 92*  --  96* 93* 93* 100*  CO2 29 30  --  29 29 27 27   GLUCOSE 153* 150* 147* 139* 137* 179* 110*  BUN 12 20  --  20 20 17 12   CREATININE 0.99 1.59*  --  1.25* 1.27* 1.03 0.86  CALCIUM 10.1 9.5  --  9.3 9.3 9.3 9.7  PHOS 5.2* 5.4*  --   --   --  4.1  --    GFR: Estimated Creatinine Clearance: 108.4 mL/min (by C-G formula based on Cr of 0.86).  Liver Function Tests:  Recent Labs Lab 11/23/15 0541 11/24/15 0530 11/27/15 0200 11/28/15 0439  AST  --   --  12*  --   ALT  --   --  12*  --   ALKPHOS  --   --  101  --   BILITOT  --   --  0.4  --   PROT  --   --  7.1  --   ALBUMIN 2.9* 2.9* 2.6* 2.4*    HbA1C: HGB A1C MFR BLD  Date/Time Value Ref Range Status  11/16/2015 04:00 AM 7.2* 4.8 - 5.6 % Final    Comment:    (NOTE)         Pre-diabetes: 5.7 - 6.4         Diabetes: >6.4         Glycemic control for adults with diabetes: <7.0   04/14/2012 05:35 AM 7.5* <5.7 % Final    Comment:    (NOTE)                                                                       According to the ADA Clinical Practice Recommendations for 2011, when HbA1c is used as a screening test:  >=6.5%   Diagnostic of Diabetes Mellitus           (if abnormal result is confirmed) 5.7-6.4%   Increased risk of developing Diabetes Mellitus References:Diagnosis and Classification of Diabetes Mellitus,Diabetes [id number]  1):S62-S69 and Standards of Medical Care in         Diabetes - 2011,Diabetes [id number] (  Suppl 1):S11-S61.    CBG:  Recent Labs Lab 11/28/15 1117 11/28/15 1616 11/28/15 2118 11/29/15 0627 11/29/15 1125  GLUCAP 205* 116* 116* 108* 108*    No results found for this or any previous visit (from the past 240 hour(s)).   Scheduled Meds: . collagenase   Topical Daily  . feeding supplement  1 Container Oral Q24H  . feeding supplement (ENSURE ENLIVE)  237 mL Oral BID PC  . feeding supplement (PRO-STAT SUGAR FREE 64)  30 mL Oral BID  . heparin subcutaneous  5,000 Units Subcutaneous Q12H  . insulin aspart  0-20 Units Subcutaneous TID WC  . insulin aspart  0-5 Units Subcutaneous QHS  . insulin glargine  10 Units Subcutaneous QHS  . magnesium oxide  400 mg Oral BID  . multivitamin with minerals  1 tablet Oral Daily  . oxyCODONE  60 mg Oral Q12H  . pantoprazole  40 mg Oral Daily  . PARoxetine  40 mg Oral Daily  . polyethylene glycol  17 g Oral BID  . pregabalin  75 mg Oral Q8H  . senna-docusate  1 tablet Oral BID  . sodium chloride flush  10-40 mL Intracatheter Q12H  . sorbitol  15 mL Oral BID  . tamsulosin  0.4 mg Oral Daily  . vancomycin  1,500 mg Intravenous Q24H     LOS: 22 days   Time spent: 25 minutes   Phillips Climes, MD [phone] [facility] Office  [phone] Pager - Text Page per Amion as per below:  On-Call/Text Page:      [email or web address]      password [email or web address]  If 7PM-7AM, please contact night-coverage [email or web address] Password [email or web address] 11/29/2015, 1:52 PM

## 2015-11-29 NOTE — Progress Notes (Addendum)
No issues overnight. Pt has no new complaints.  EXAM:  BP 119/82 mmHg  Pulse 90  Temp(Src) 97.5 F (36.4 C) (Oral)  Resp 16  Ht 6\' 1"  (1.854 m)  Wt 89.631 kg (197 lb 9.6 oz)  BMI 26.08 kg/m2  SpO2 100%  Awake, alert, oriented  Speech fluent, appropriate  CN grossly intact  Stable weakness   IMPRESSION:  55 y.o. 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 Underwent C5 corpectomy 4 days ago.  PLAN: - Proceed with posterior decompression/stabilization C3-7  I have reviewed the details of the procedure, risks, benefits, and alternatives with the patient and his daughter. All questions were answered.

## 2015-11-29 NOTE — Brief Op Note (Signed)
11/07/2015 - 11/29/2015  8:25 PM  PATIENT:  Gregory [name]  55 y.o. male  PRE-OPERATIVE DIAGNOSIS:  Cervical  POST-OPERATIVE DIAGNOSIS:  Cervical Stenosis and Osteomyelitis  PROCEDURE:  Procedure(s) with comments: Cervical Three-Cervical Seven Posterior Cervical Laminectomy with Fusion (N/A) - Cervical Three-Cervical Seven Posterior Cervical Laminectomy with Fusion  SURGEON:  Surgeon(s) and Role:    * Consuella Lose, MD - Primary    * Earnie Larsson, MD - Assisting  PHYSICIAN ASSISTANT:   ASSISTANTS: Dr. Amado Coe, MD   ANESTHESIA:   general  EBL:  275cc  BLOOD ADMINISTERED:none  DRAINS: none   LOCAL MEDICATIONS USED:  BUPIVICAINE   SPECIMEN:  No Specimen  DISPOSITION OF SPECIMEN:  N/A  COUNTS:  YES  TOURNIQUET:  * No tourniquets in log *  DICTATION: .Note written in EPIC  PLAN OF CARE: Return to [place]:  PACU - hemodynamically stable.   Delay start of Pharmacological VTE agent (>24hrs) due to surgical blood loss or risk of bleeding: yes

## 2015-11-29 NOTE — Anesthesia Preprocedure Evaluation (Addendum)
Anesthesia Evaluation  Patient identified by MRN, date of birth, ID band Patient awake    Reviewed: Allergy & Precautions, H&P , NPO status , Patient's Chart, lab work & pertinent test results  History of Anesthesia Complications Negative for: history of anesthetic complications  Airway Mallampati: I  TM Distance: >3 FB Neck ROM: Limited   Comment: Limited by cervical collar Dental no notable dental hx. (+) Teeth Intact   Pulmonary neg pulmonary ROS, COPD, former smoker,    Pulmonary exam normal  (-) decreased breath sounds      Cardiovascular hypertension, Pt. on medications and Pt. on home beta blockers +CHF  negative cardio ROS Normal cardiovascular exam  Echo [id number] with diastolic heart failure, EF is 60%   Neuro/Psych PSYCHIATRIC DISORDERS negative neurological ROS     GI/Hepatic negative GI ROS, Neg liver ROS,   Endo/Other  diabetes  Renal/GU Renal disease     Musculoskeletal  (+) Arthritis ,   Abdominal Normal abdominal exam  (+)   Peds  Hematology negative hematology ROS (+) anemia ,   Anesthesia Other Findings Chronic pain  Reproductive/Obstetrics negative OB ROS                           Anesthesia Physical  Anesthesia Plan  ASA: II  Anesthesia Plan: General   Post-op Pain Management:    Induction: Intravenous  Airway Management Planned: Oral ETT and Video Laryngoscope Planned  Additional Equipment:   Intra-op Plan:   Post-operative Plan:   Informed Consent: I have reviewed the patients History and Physical, chart, labs and discussed the procedure including the risks, benefits and alternatives for the proposed anesthesia with the patient or authorized representative who has indicated his/her understanding and acceptance.   Dental Advisory Given and History available from chart only  Plan Discussed with: CRNA and Surgeon  Anesthesia Plan Comments:          Anesthesia Quick Evaluation

## 2015-11-30 ENCOUNTER — Encounter (HOSPITAL_COMMUNITY): Payer: Self-pay | Admitting: Neurosurgery

## 2015-11-30 DIAGNOSIS — M4642 Discitis, unspecified, cervical region: Secondary | ICD-10-CM

## 2015-11-30 DIAGNOSIS — G061 Intraspinal abscess and granuloma: Secondary | ICD-10-CM

## 2015-11-30 LAB — TYPE AND SCREEN
ABO/RH(D): O POS
Antibody Screen: NEGATIVE

## 2015-11-30 LAB — GLUCOSE, CAPILLARY
GLUCOSE-CAPILLARY: 125 mg/dL — AB (ref 65–99)
Glucose-Capillary: 138 mg/dL — ABNORMAL HIGH (ref 65–99)
Glucose-Capillary: 158 mg/dL — ABNORMAL HIGH (ref 65–99)
Glucose-Capillary: 184 mg/dL — ABNORMAL HIGH (ref 65–99)

## 2015-11-30 MED ORDER — WHITE PETROLATUM GEL
Status: AC
  Administered 2015-11-30: 09:00:00
  Filled 2015-11-30: qty 1

## 2015-11-30 MED ORDER — MORPHINE SULFATE (PF) 2 MG/ML IV SOLN
2.0000 mg | INTRAVENOUS | Status: DC | PRN
  Administered 2015-11-30 – 2015-12-02 (×13): 2 mg via INTRAVENOUS
  Filled 2015-11-30 (×13): qty 1

## 2015-11-30 NOTE — Progress Notes (Signed)
Patient thew dinamap at charge nurse when she tried to assist patient

## 2015-11-30 NOTE — NC FL2 (Signed)
[place] MEDICAID FL2 LEVEL OF CARE SCREENING TOOL     IDENTIFICATION  Patient Name: Gregory [name] Birthdate: June 16, 1961 Sex: male Admission Date (Current Location): 11/07/2015  [facility] and [place] Number:  Herbalist and Address:  [facility] [place], [place] [address], [place], [place] [address]      Provider Number: [id number]  Attending Physician Name and Address:  [name], MD  Relative Name and Phone Number:       Current Level of Care: Hospital Recommended Level of Care: [place] Prior Approval Number:    Date Approved/Denied:   PASRR Number: [id number]  Discharge Plan: SNF    Current Diagnoses: Patient Active Problem List   Diagnosis Date Noted  . Abscess in epidural space of lumbar spine   . Osteomyelitis of cervical spine ([place])   . Cervical discitis   . Sepsis due to methicillin resistant Staphylococcus aureus (MRSA) ([place])   . CAP (community acquired pneumonia) due to MRSA (methicillin resistant Staphylococcus aureus) ([place])   . Escherichia coli urinary tract infection   . Chronic diastolic CHF (congestive heart failure) ([place])   . Pulmonary hypertension ([place])   . Essential hypertension   . HLD (hyperlipidemia)   . Acute renal failure ([place])   . Urinary retention   . Uncontrolled type 2 diabetes mellitus with complication ([place])   . Discitis of cervical region   . Synovial cyst of lumbar spine   . Weakness of both lower extremities   . Foot drop, bilateral   . Pneumonia of both lower lobes due to methicillin resistant Staphylococcus aureus (MRSA) ([place])   . Opioid use with withdrawal ([place])   . Acute respiratory failure with hypoxia ([place])   . Encounter for central line placement   . Encounter for orogastric (OG) tube placement   . Endotracheally intubated   . Acute respiratory failure with hypoxemia ([place])   . Sepsis ([place])   . UTI (lower urinary tract infection)   . Osteomyelitis ([place])   . Pressure ulcer 11/10/2015   . Septic shock ([place]) 11/07/2015  . Altered mental status   . MRSA bacteremia 10/03/2015  . COPD (chronic obstructive pulmonary disease) ([place]) 10/03/2015  . 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 Osteomyelitis of foot, left, acute ([place]) 10/03/2015  . Lumbar discitis 10/03/2015  . Pneumococcal pneumonia ([place]) 03/25/2012  . Diabetes ([place]) 03/25/2012    Orientation RESPIRATION BLADDER Height & Weight     Self, Time, Situation, Place  O2 (Nasal cannula 3L) Continent, Indwelling catheter (Urinary catheter) Weight: 89.631 kg (197 lb 9.6 oz) Height:  6\' 1"  (185.4 cm)  BEHAVIORAL SYMPTOMS/MOOD NEUROLOGICAL BOWEL NUTRITION STATUS   (None)  (None) Incontinent Diet (Please see DC summary)  AMBULATORY STATUS COMMUNICATION OF NEEDS Skin   Extensive Assist Verbally PU Stage and Appropriate Care, Surgical wounds, Other (Comment) (PU stage I on buttocks; wound on toe; closed incision on neck) PU Stage 1 Dressing:  (Foam prn buttocks)                     Personal Care Assistance Level of Assistance  Bathing, Feeding, Dressing Bathing Assistance: Maximum assistance Feeding assistance: Limited assistance Dressing Assistance: Limited assistance     Functional Limitations Info  Sight, Hearing, Speech Sight Info: Adequate Hearing Info: Adequate Speech Info: Adequate    SPECIAL CARE FACTORS FREQUENCY  PT (By licensed PT)     PT Frequency: not yet assessed; just had surgery  Speech Therapy Frequency: 5 x week      Contractures Contractures Info: Not present    Additional Factors Info  Code Status, Allergies Code Status Info: Full Allergies Info: NKA     Isolation Precautions Info: MRSA     Current Medications (11/30/2015):  This is the current hospital active medication list Current Facility-Administered Medications  Medication Dose Route Frequency Provider Last Rate Last Dose  . 0.9 %  sodium chloride infusion   Intravenous Continuous Cherene Altes, MD 50  mL/hr at 11/27/15 1030    . acetaminophen (TYLENOL) solution 650 mg  650 mg Oral Q6H PRN Cherene Altes, MD   650 mg at 11/30/15 0157  . bisacodyl (DULCOLAX) suppository 10 mg  10 mg Rectal Daily PRN Cherene Altes, MD      . collagenase (SANTYL) ointment   Topical Daily Javier Glazier, MD      . diazepam (VALIUM) tablet 5 mg  5 mg Oral Q8H PRN Cherene Altes, MD   5 mg at 11/30/15 1140  . feeding supplement (BOOST / RESOURCE BREEZE) liquid 1 Container  1 Container Oral Q24H Albertine Patricia, MD   1 Container at 11/30/15 [phone]  . feeding supplement (ENSURE ENLIVE) (ENSURE ENLIVE) liquid 237 mL  237 mL Oral BID PC Albertine Patricia, MD   237 mL at 11/28/15 1900  . feeding supplement (PRO-STAT SUGAR FREE 64) liquid 30 mL  30 mL Oral BID Albertine Patricia, MD   30 mL at 11/30/15 0821  . heparin injection 5,000 Units  5,000 Units Subcutaneous Q12H Albertine Patricia, MD   5,000 Units at 11/30/15 0821  . hydrALAZINE (APRESOLINE) injection 10 mg  10 mg Intravenous Q4H PRN Javier Glazier, MD   10 mg at 11/14/15 1318  . insulin aspart (novoLOG) injection 0-20 Units  0-20 Units Subcutaneous TID WC Cherene Altes, MD   4 Units at 11/30/15 1117  . insulin aspart (novoLOG) injection 0-5 Units  0-5 Units Subcutaneous QHS Cherene Altes, MD   2 Units at 11/27/15 2127  . insulin glargine (LANTUS) injection 10 Units  10 Units Subcutaneous QHS Cherene Altes, MD   10 Units at 11/29/15 2013  . ipratropium-albuterol (DUONEB) 0.5-2.5 (3) MG/3ML nebulizer solution 3 mL  3 mL Nebulization Q2H PRN Chesley Mires, MD      . magnesium oxide (MAG-OX) tablet 400 mg  400 mg Oral BID Cherene Altes, MD   400 mg at 11/30/15 [id number]  . morphine 2 MG/ML injection 2 mg  2 mg Intravenous Q2H PRN Consuella Lose, MD   2 mg at 11/30/15 1326  . multivitamin with minerals tablet 1 tablet  1 tablet Oral Daily Albertine Patricia, MD   1 tablet at 11/30/15 0820  . ondansetron (ZOFRAN) injection 4 mg  4 mg  Intravenous Q6H PRN Cherene Altes, MD   4 mg at 11/26/15 1235  . oxyCODONE (Oxy IR/ROXICODONE) immediate release tablet 5 mg  5 mg Oral Q6H PRN Cherene Altes, MD   5 mg at 11/30/15 0551  . oxyCODONE (OXYCONTIN) 12 hr tablet 60 mg  60 mg Oral Q12H Cherene Altes, MD   60 mg at 11/30/15 0820  . pantoprazole (PROTONIX) EC tablet 40 mg  40 mg Oral Daily Raylene Miyamoto, MD   40 mg at 11/30/15 1032  . PARoxetine (PAXIL) tablet 40 mg  40 mg Oral Daily Cherene Altes, MD   40 mg at 11/30/15  0820  . phenol (CHLORASEPTIC) mouth spray 1 spray  1 spray Mouth/Throat PRN Leeroy Cha, MD   1 spray at 11/27/15 1020  . polyethylene glycol (MIRALAX / GLYCOLAX) packet 17 g  17 g Oral BID Cherene Altes, MD   17 g at 11/28/15 2143  . pregabalin (LYRICA) capsule 75 mg  75 mg Oral Q8H Allie Bossier, MD   75 mg at 11/30/15 1132  . prochlorperazine (COMPAZINE) injection 5-10 mg  5-10 mg Intravenous Q4H PRN Cherene Altes, MD   5 mg at 11/26/15 1543  . senna-docusate (Senokot-S) tablet 1 tablet  1 tablet Oral BID Cherene Altes, MD   1 tablet at 11/30/15 [phone]  . sodium chloride (OCEAN) 0.65 % nasal spray 1 spray  1 spray Each Nare PRN Consuella Lose, MD   1 spray at 11/28/15 2022  . sodium chloride flush (NS) 0.9 % injection 10-40 mL  10-40 mL Intracatheter Q12H Allie Bossier, MD   10 mL at 11/29/15 0540  . sodium chloride flush (NS) 0.9 % injection 10-40 mL  10-40 mL Intracatheter PRN Allie Bossier, MD   10 mL at 11/28/15 0438  . sorbitol 70 % solution 15 mL  15 mL Oral BID Cherene Altes, MD   15 mL at 11/26/15 0919  . tamsulosin (FLOMAX) capsule 0.4 mg  0.4 mg Oral Daily Chesley Mires, MD   0.4 mg at 11/30/15 0821  . traMADol (ULTRAM) tablet 50 mg  50 mg Oral Q6H PRN Cherene Altes, MD   50 mg at 11/30/15 0820  . vancomycin (VANCOCIN) 1,500 mg in sodium chloride 0.9 % 500 mL IVPB  1,500 mg Intravenous Q24H Theone Murdoch Hammons, RPH   1,500 mg at 11/28/15 1537     Discharge  Medications: Please see discharge summary for a list of discharge medications.  Relevant Imaging Results:  Relevant Lab Results:   Additional Information SS#: [id number]  Benard Halsted, LCSWA

## 2015-11-30 NOTE — Progress Notes (Addendum)
While in the ;patients room, I observed the patient putting the suctioning yanker in his mouth and suctioning himself until his face turned red.  I tried to explain to the patient the proper way to use the suction, for which he cursed at me.  I cut the suction off at the yanker handle and asked him to call me if he feels he needs to be suctioned, I would assist him.  Patient got very angry and started yelling down the hallway, stating his suctioning is not working.  He was disturbing other patients on the floor.  I asked him to please quiet down, and he threw the yanker at the ground.  Nurse and tech left patient to calm down

## 2015-11-30 NOTE — Op Note (Signed)
PREOP DIAGNOSIS:  1. Cervical stenosis 2. Cervical kyphotic deformity 3. Osteomyelitis/discicits C5-6   POSTOP DIAGNOSIS: Same  PROCEDURE: 1. C3-7 laminectomy for decompression of spinal cord 2. Placement of posterior segmental instrumentation, C3-7 Medtronic lateral mass 3. Posterolateral arthrodesis, C3-4, C4-5, C5-6, C6-7 4. Use of non-structural morcellized bone allograft (Vitoss BA, DBX putty)  SURGEON: Dr. Consuella Lose, MD  ASSISTANT: Dr. Earnie Larsson, MD  ANESTHESIA: General Endotracheal  EBL: 275cc  SPECIMENS: None  DRAINS: None  COMPLICATIONS: None immediate  CONDITION: Hemodynamically stable to PACU  HISTORY: Gregory [name] is a 55 y.o. male with a history of C5-6 osteomyelitis, discitis and kyphotic deformity resulting in stenosis, spinal cord compression, and myelopathy. He underwent stage I anterior decompression/stabilization/fusion this past Friday, and presents today for posterior decompression/stabilization.   PROCEDURE IN DETAIL: After informed consent was obtained and witnessed, the patient was brought to the operating room. After induction of general anesthesia, the patient was positioned on the operative table in the prone position in the [place] headholder. All pressure points were meticulously padded. Skin incision was then marked out and prepped and draped in the usual sterile fashion.  After timeout was conducted, skin incision was infiltrated with local anesthetic with epinephrine.  Incision was then made sharply, and Bovie electrocautery was used to dissect through subcutaneous tissue until the nuchal fascia was identified and incised in the midline.  Spinous processes of the lower cervical spine were then identified, and subperiosteal dissection was carried out until the lamina and lateral masses were identified.  Self-retaining retractors were placed.  Intraoperative fluoroscopy was used to confirm our location at the correct levels.  Subperiosteal  dissection was then further carried out laterally, until the entire lateral mass and facet complex were identified at C3, C4, C5, C6, and C7.  Self-retaining retractor was then replaced.  At this point, pilot holes for lateral mass screws were drilled and tapped at C3, C4, C5, C6, and C7.   Attention was then turned to decompression.  Using a combination of [name] and high-speed drill, complete laminectomy was completed at C4, C5, and C6.  Kerr[place] Stann [place] and the drill were then used to complete partial superior laminectomy at C7, as well as partial inferior laminectomy at C3 C4 complete decompression of the spinal cord.  Hemostasis was then achieved on the epidural surface using a combination of bipolar electrocautery and morcellized Gelfoam and thrombin.  After completing decompression, the exposed lateral mass and facet complexes at C3 C4, C4 C5, C5 C6, and C6 C7 were then decorticated with the high-speed drill in preparation for arthrodesis.  Lateral mass screws were placed, with 12 mm screws at C3, and 14 mm screws at C4, C5, C6, and C7.  Pre-bent 80 mm rods were then selected, and placed.  Set screws were then placed and final tightened.  A combination of demineralized bone matrix putty and Vioss BA was then placed in the lateral gutters on the lateral masses and facet complexes for posterolateral arthrodesis.  At this point, having completed decompression and stabilization with arthrodesis, self-retaining retractors were removed and the muscular layer was reapproximated with 0 Vicryl stitches.  The wound was then closed in multiple layers using a combination of 0 Vicryl stitches.  Skin was closed with skin staples.  Bacitracin ointment and sterile dressing was then applied.  The patient was then transferred to the stretcher, the [place] head holder was removed, and he was taken to the postanesthesia care unit after extubation in stable hemodynamic condition.  At  the end of the case all sponge,  needle, instrument, and cottonoid counts were correct.

## 2015-11-30 NOTE — Progress Notes (Signed)
Pt was combative overnight, improved after speaking with his daughter. He c/o severe neck pain currently.  EXAM:  BP 168/92 mmHg  Pulse 99  Temp(Src) 98.9 F (37.2 C) (Oral)  Resp 20  Ht 6\' 1"  (1.854 m)  Wt 89.631 kg (197 lb 9.6 oz)  BMI 26.08 kg/m2  SpO2 98%  Awake, alert, oriented  Speech fluent, appropriate  CN grossly intact  4/5 BUE 4/5 proximal BLE, wiggles toes on left, 1/5 DF, 2/5 PF, 1/5 right DF, 0/5 right PF  IMPRESSION:  55 y.o. male POD#1 c3-7 laminectomy/lat mass fusion  PLAN: - Cont current mgmt - will add IV morphine PRN in the immediate postop period.

## 2015-11-30 NOTE — Progress Notes (Signed)
Gregory [name]  [id number] DOB: 1961-04-05 DOA: 11/07/2015 PCP: Tamsen Roers, MD    Brief Narrative:  55 yo M Hx DM, HTN, COPD, DDD, Chronic Urinary retention, and MRSA bacteremia w/ C-spine L-spine L foot and L shoulder osteo initially seen at [facility] (Feb 2017) where surgery was contemplated. Daughter unsure why surgery not undertaken but long-term antibiotics were prescribed. Patient received 2 months IV antibiotics (completed course 1 May) and was discharged to [facility].  The pt was subsequently brought to ER 5/22 with altered mental status. He was hypoxic, diaphoretic. He was intubated for airway protection. he had purulent appearing urine. He was started on antibiotics, IV fluids, and pressors.  He was found to have an E coli UTI.  He remained on vancomycin for osteomyelitis/discitis, as well as newly appreciated MRSA pneumonia/tracheobronchitis.   Subjective:  RN reported patient's behavioral change last night, patient's daughter who works at [employer] long came in am and talked to patient, now patient is calm, but continue report pain is not well controlled.    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 Kathyrn Sheriff. - Antibiotics management per ID ,day 23/56 of vancomycin -diet and activity per neurosurgery  Acute hypoxia -likely due to atelectasis/poor inspiratory effort - improved / stable - cont O2 support - up to chair - encouraged IS use   Bilateral lower extremity weakness / bilateral foot drop -due to above - NS following - appears to be improving   Sepsis due to MRSA Bacteremia w/ Pneumonia/Tracheobronchitis/osteomyelitis/discitis -abx per ID - Vanc continues   E coli UTI -completed 6 day course of antibiotics - f/u urine cx no growth   Pain management /  withdrawal from high-dose opioids -stable w/ pain reasonably controlled - avoid escalating doses of narcotics given atx and poor inspiratory effort   Chronic Diastolic CHF -TTE 6/4 noted EF 55-60% and grade 1 DD - no volume overload on exam  Filed Weights   11/27/15 0423 11/28/15 0500 11/29/15 0400  Weight: 92.987 kg (205 lb) 94 kg (207 lb 3.7 oz) 89.631 kg (197 lb 9.6 oz)    COPD -well compensated   HTN  -controlled   Acute Renal Failure  -baseline Cr 0.98 - follow trend while intake poor - gently hydrate   Chronic Urinary Retention -likely secondary to spine abscess and bladder damage secondary chronic urinary retention/distention - daughter confirms pt is followed by a Urologist as outpt   Anemia -likely due to renal failure and chronic smoldering infection - Hgb stable     DM2 -5/31A1c 7.2 - CBG not yet at goal - adjust tx and follow   Hypokalemia -corrected   Hypomagnesemia -corrected   Neurogenic bladder, reported self catheterization, reported baseline wheelchair bound. Activity per neurosurgery.     DVT prophylaxis: SCDs/Subcutaneous heparin Code Status: FULL CODE Family Communication: no family present at time of exam  Disposition Plan: diet and activity level per neurosurgery   Consultants:  ID Neurosurgery   Procedures: 6/4 TTE EF 55-60% - grade 1 diastolic dysfunction 6/6 PICC placed  6/9 anterior approach cervical spine surgery   Antimicrobials:  Zosyn 5/21 Cefepime 5/22 - 5/25 Rocephin 5/25 > 5/26 Vancomycin 5/22 >  Objective: Blood pressure 173/100, pulse 95, temperature 99.1 F (37.3 C), temperature source Oral, resp. rate 18, height 6\' 1"  (1.854 m), weight  89.631 kg (197 lb 9.6 oz), SpO2 99 %.  Intake/Output Summary (Last 24 hours) at 11/30/15 0817 Last data filed at 11/30/15 0150  Gross per 24 hour  Intake   2450 ml  Output    700 ml  Net   1750 ml   Filed Weights   11/27/15 0423 11/28/15 0500 11/29/15 0400  Weight: 92.987 kg  (205 lb) 94 kg (207 lb 3.7 oz) 89.631 kg (197 lb 9.6 oz)    Examination: General: no acute resp distress, on neck collar Lungs: good air movement th/o - no wheeze - no focal crackles  Cardiovascular: regular rate and rhythm w/o M Abdomen: soft, bowel sounds positive, no ascites, nontender Extremities: trace edema bilateral lower extremities  CBC:  Recent Labs Lab 11/24/15 0530 11/25/15 1653 11/26/15 0459 11/27/15 0200 11/29/15 0500  WBC 13.5*  --  10.1 12.4* 7.2  HGB 10.3* 10.2* 9.1* 9.4* 8.3*  HCT 34.2* 30.0* 30.7* 30.2* 27.6*  MCV 84.9  --  86.2 84.4 85.4  PLT 254  --  214 229 [id number]   Basic Metabolic Panel:  Recent Labs Lab 11/24/15 0530 11/25/15 1653 11/26/15 0459 11/27/15 0200 11/28/15 0439 11/29/15 0500  NA 131* 134* 134* 131* 131* 137  K 5.5* 4.4 4.9 4.1 3.8 3.7  CL 92*  --  96* 93* 93* 100*  CO2 30  --  29 29 27 27   GLUCOSE 150* 147* 139* 137* 179* 110*  BUN 20  --  20 20 17 12   CREATININE 1.59*  --  1.25* 1.27* 1.03 0.86  CALCIUM 9.5  --  9.3 9.3 9.3 9.7  PHOS 5.4*  --   --   --  4.1  --    GFR: Estimated Creatinine Clearance: 108.4 mL/min (by C-G formula based on Cr of 0.86).  Liver Function Tests:  Recent Labs Lab 11/24/15 0530 11/27/15 0200 11/28/15 0439  AST  --  12*  --   ALT  --  12*  --   ALKPHOS  --  101  --   BILITOT  --  0.4  --   PROT  --  7.1  --   ALBUMIN 2.9* 2.6* 2.4*    HbA1C: HGB A1C MFR BLD  Date/Time Value Ref Range Status  11/16/2015 04:00 AM 7.2* 4.8 - 5.6 % Final    Comment:    (NOTE)         Pre-diabetes: 5.7 - 6.4         Diabetes: >6.4         Glycemic control for adults with diabetes: <7.0   04/14/2012 05:35 AM 7.5* <5.7 % Final    Comment:    (NOTE)                                                                       According to the ADA Clinical Practice Recommendations for 2011, when HbA1c is used as a screening test:  >=6.5%   Diagnostic of Diabetes Mellitus           (if abnormal result is  confirmed) 5.7-6.4%   Increased risk of developing Diabetes Mellitus References:Diagnosis and Classification of Diabetes Mellitus,Diabetes [id number] 1):S62-S69 and Standards of Medical Care in  Diabetes - 2011,Diabetes Care,2011,34 (Suppl 1):S11-S61.    CBG:  Recent Labs Lab 11/29/15 0627 11/29/15 1125 11/29/15 1819 11/29/15 2022 11/30/15 0708  GLUCAP 108* 108* 136* 159* 138*    No results found for this or any previous visit (from the past 240 hour(s)).   Scheduled Meds: . collagenase   Topical Daily  . feeding supplement  1 Container Oral Q24H  . feeding supplement (ENSURE ENLIVE)  237 mL Oral BID PC  . feeding supplement (PRO-STAT SUGAR FREE 64)  30 mL Oral BID  . heparin subcutaneous  5,000 Units Subcutaneous Q12H  . insulin aspart  0-20 Units Subcutaneous TID WC  . insulin aspart  0-5 Units Subcutaneous QHS  . insulin glargine  10 Units Subcutaneous QHS  . magnesium oxide  400 mg Oral BID  . multivitamin with minerals  1 tablet Oral Daily  . oxyCODONE  60 mg Oral Q12H  . pantoprazole  40 mg Oral Daily  . PARoxetine  40 mg Oral Daily  . polyethylene glycol  17 g Oral BID  . pregabalin  75 mg Oral Q8H  . senna-docusate  1 tablet Oral BID  . sodium chloride flush  10-40 mL Intracatheter Q12H  . sorbitol  15 mL Oral BID  . tamsulosin  0.4 mg Oral Daily  . vancomycin  1,500 mg Intravenous Q24H     LOS: 23 days   Time spent: 25 minutes   [name], MD PhD [phone] [facility] Office  [phone] Pager - Text Page per Amion as per below:   If 7PM-7AM, please contact night-coverage [email or web address] Password [facility] 11/30/2015, 8:17 AM

## 2015-11-30 NOTE — Progress Notes (Signed)
Patients daughter is a Marine scientist at [employer]; she called to check on him and I told her about his behavior.  I noticed that his OxyContin has been reduced while being here from 90 to 60.  This behavior is happening before his next dose.  Daughter thinks it is a mental addiction that is causing this behavior.  She spoke with patient, patients behavior has changed dramatically.

## 2015-12-01 ENCOUNTER — Encounter (HOSPITAL_COMMUNITY): Payer: Self-pay | Admitting: Neurosurgery

## 2015-12-01 DIAGNOSIS — M21372 Foot drop, left foot: Secondary | ICD-10-CM

## 2015-12-01 DIAGNOSIS — M21371 Foot drop, right foot: Secondary | ICD-10-CM

## 2015-12-01 DIAGNOSIS — I5032 Chronic diastolic (congestive) heart failure: Secondary | ICD-10-CM

## 2015-12-01 DIAGNOSIS — R339 Retention of urine, unspecified: Secondary | ICD-10-CM

## 2015-12-01 DIAGNOSIS — A4102 Sepsis due to Methicillin resistant Staphylococcus aureus: Principal | ICD-10-CM

## 2015-12-01 LAB — CBC
HCT: 27.3 % — ABNORMAL LOW (ref 39.0–52.0)
Hemoglobin: 8.3 g/dL — ABNORMAL LOW (ref 13.0–17.0)
MCH: 25.4 pg — ABNORMAL LOW (ref 26.0–34.0)
MCHC: 30.4 g/dL (ref 30.0–36.0)
MCV: 83.5 fL (ref 78.0–100.0)
PLATELETS: 290 10*3/uL (ref 150–400)
RBC: 3.27 MIL/uL — AB (ref 4.22–5.81)
RDW: 15.7 % — AB (ref 11.5–15.5)
WBC: 9.3 10*3/uL (ref 4.0–10.5)

## 2015-12-01 LAB — BASIC METABOLIC PANEL
Anion gap: 11 (ref 5–15)
BUN: 9 mg/dL (ref 6–20)
CALCIUM: 9.5 mg/dL (ref 8.9–10.3)
CHLORIDE: 100 mmol/L — AB (ref 101–111)
CO2: 25 mmol/L (ref 22–32)
CREATININE: 0.8 mg/dL (ref 0.61–1.24)
Glucose, Bld: 150 mg/dL — ABNORMAL HIGH (ref 65–99)
Potassium: 3.5 mmol/L (ref 3.5–5.1)
SODIUM: 136 mmol/L (ref 135–145)

## 2015-12-01 LAB — GLUCOSE, CAPILLARY
GLUCOSE-CAPILLARY: 161 mg/dL — AB (ref 65–99)
GLUCOSE-CAPILLARY: 116 mg/dL — AB (ref 65–99)
GLUCOSE-CAPILLARY: 161 mg/dL — AB (ref 65–99)
Glucose-Capillary: 144 mg/dL — ABNORMAL HIGH (ref 65–99)

## 2015-12-01 LAB — SEDIMENTATION RATE: SED RATE: 132 mm/h — AB (ref 0–16)

## 2015-12-01 MED ORDER — POTASSIUM CHLORIDE CRYS ER 20 MEQ PO TBCR
40.0000 meq | EXTENDED_RELEASE_TABLET | ORAL | Status: AC
  Administered 2015-12-01 (×2): 40 meq via ORAL
  Filled 2015-12-01 (×2): qty 2

## 2015-12-01 MED ORDER — BOOST / RESOURCE BREEZE PO LIQD
1.0000 | Freq: Three times a day (TID) | ORAL | Status: DC
  Administered 2015-12-02: 1 via ORAL
  Filled 2015-12-01 (×6): qty 1

## 2015-12-01 MED ORDER — METOPROLOL TARTRATE 12.5 MG HALF TABLET
12.5000 mg | ORAL_TABLET | Freq: Two times a day (BID) | ORAL | Status: DC
  Administered 2015-12-01: 12.5 mg via ORAL
  Filled 2015-12-01: qty 1

## 2015-12-01 MED ORDER — FUROSEMIDE 40 MG PO TABS
40.0000 mg | ORAL_TABLET | Freq: Two times a day (BID) | ORAL | Status: DC
  Administered 2015-12-01 – 2015-12-02 (×2): 40 mg via ORAL
  Filled 2015-12-01 (×2): qty 1

## 2015-12-01 MED ORDER — METOPROLOL TARTRATE 25 MG PO TABS
25.0000 mg | ORAL_TABLET | Freq: Two times a day (BID) | ORAL | Status: DC
  Administered 2015-12-01 – 2015-12-02 (×2): 25 mg via ORAL
  Filled 2015-12-01: qty 1

## 2015-12-01 MED ORDER — LISINOPRIL 10 MG PO TABS
10.0000 mg | ORAL_TABLET | Freq: Every day | ORAL | Status: DC
  Administered 2015-12-01 – 2015-12-02 (×2): 10 mg via ORAL
  Filled 2015-12-01 (×2): qty 1

## 2015-12-01 NOTE — Progress Notes (Addendum)
Nutrition Follow-up  DOCUMENTATION CODES:   Obesity unspecified  INTERVENTION:  Recommend advancing to Dysphagia 1 diet (pureed foods) Boost Breeze po TID, each supplement provides 250 kcal and 9 grams of protein Pro-stat BID, each dose provides 100 kcal and 15 grams of protein  NUTRITION DIAGNOSIS:   Increased nutrient needs related to wound healing (osteomyelitis, surgery ) as evidenced by estimated needs.  Ongoing  GOAL:   Patient will meet greater than or equal to 90% of their needs  Unmet  MONITOR:   PO intake, Supplement acceptance, Labs, Weight trends, Skin, I & O's  REASON FOR ASSESSMENT:   Consult Enteral/tube feeding initiation and management  ASSESSMENT:   55 yo male from NH with acute encephalopathy 2nd to septic shock from UTI, AKI, VDRF, and chronic opiate use. He has hx of MRSA bacteremia with diffuse areas of osteomyelitis in February 2017.  Pt was made NPO for surgery and put back on full liquids in the evening of 6/13. Pt reports poor appetite, and states that he does note like Ensure. He likes [employer] and is taking the pro-stat. He has lost more weight in the past few days, now down to 197 lbs. Discussed weight loss with patient and concern for malnutrition. Encouraged intake of more full liquids and supplements. Pt declined nutrition-focused physical exam, stated that he was in too much pain and just wanted to go back to sleep.   Labs: glucose ranging 108 to 184 mg/dL, low hemoglobin  Diet Order:  Diet full liquid Room service appropriate?: Yes; Fluid consistency:: Thin  Skin:  Wound (see comment) (Stage I pressure ulcer to R+L buttocks, neck incision)  Last BM:  6/13  Height:   Ht Readings from Last 1 Encounters:  11/18/15 6\' 1"  (1.854 m)    Weight:   Wt Readings from Last 1 Encounters:  11/29/15 197 lb 9.6 oz (89.631 kg)    Ideal Body Weight:  80.9 kg  BMI:  Body mass index is 26.08 kg/(m^2).  Estimated Nutritional Needs:    Kcal:  [id number]  Protein:  130-140 grams  Fluid:  2.3-2.5 L/day  EDUCATION NEEDS:   No education needs identified at this time  Slick, LDN Inpatient Clinical Dietitian Pager: [phone] After Hours Pager: [phone]

## 2015-12-01 NOTE — Progress Notes (Signed)
No issues overnight. Pt reports some improvement in his neck pain. Tolerating diet. Reports subjective improvement in arm and leg strength since surgery.  EXAM:  BP 174/98 mmHg  Pulse 117  Temp(Src) 98.4 F (36.9 C) (Oral)  Resp 16  Ht 6\' 1"  (1.854 m)  Wt 89.631 kg (197 lb 9.6 oz)  BMI 26.08 kg/m2  SpO2 93%  Awake, alert, oriented  Speech fluent, appropriate  CN grossly intact  Mild UE weakness, 4-/5, 4/5 grip 4-/5 proximal BLE, 1/5 distal  IMPRESSION:  55 y.o. male s/p circumferential decompression/fusion for stenosis/kyphosis/osteomyelitis C5-6, recovering as expected  PLAN: - Cont therapy, will likely need SNF placement

## 2015-12-01 NOTE — Progress Notes (Signed)
   12/01/15 1330  Clinical Encounter Type  Visited With Patient  Visit Type Initial  Referral From Nurse  Spiritual Encounters  Spiritual Needs Prayer;Emotional  Chaplain visited with patient , required to wear gown and mask.  Patient yelling loudly for nurse.  Chaplain asked to pray with patient.  Patient open to receiving prayer.  Chaplain prayed and offered further support if needed.

## 2015-12-01 NOTE — Progress Notes (Signed)
Patient is able to discharge to [facility] with a 30 day LOG when medically stable. Daughter, Joellen Jersey, will go there tomorrow to fill out paperwork.  Percell Locus Pranit Owensby LCSWA [phone]

## 2015-12-01 NOTE — Progress Notes (Signed)
 Gregory [name]  MRN:[id number] DOB: 10/08/1960 DOA: 11/07/2015 PCP: LITTLE,JAMES, MD    Brief Narrative:  55 yo M Hx DM, HTN, COPD, DDD, Chronic Urinary retention, and MRSA bacteremia w/ C-spine L-spine L foot and L shoulder osteo initially seen at [place] (Feb 2017) where surgery was contemplated. Daughter unsure why surgery not undertaken but long-term antibiotics were prescribed. Patient received 2 months IV antibiotics (completed course 1 May) and was discharged to [facility].  The pt was subsequently brought to ER 5/22 with altered mental status. He was hypoxic, diaphoretic. He was intubated for airway protection. he had purulent appearing urine. He was started on antibiotics, IV fluids, and pressors.  He was found to have an E coli UTI.  He remained on vancomycin for osteomyelitis/discitis, as well as newly appreciated MRSA pneumonia/tracheobronchitis.   Subjective:  drowsy after prn pain meds, bp elevated.    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 Nundkumar. - Antibiotics management per ID ,day 25/56 of vancomycin as of 6/15, plant to continue iv vanc through 7/15th. Trough goal of 15-20, Twice weekly bmp, vancomycin trough , repeat ESR  in 2 weeks -Do not lift arm above shoulder, do not lift weight above 10pounds per neurosrugery, keep neck collar on for another 4weeks. F/u with Dr Nundkumar in 4 weeks   Acute hypoxia -likely due to atelectasis/poor inspiratory effort - improved / stable - cont O2 support - up to chair - encouraged IS use   Bilateral lower extremity weakness / bilateral foot drop/ reported has been wheelchair bound for the last 4months -due to above - NS following - appears to be improving   Sepsis due to MRSA Bacteremia w/  Pneumonia/Tracheobronchitis/osteomyelitis/discitis -abx per ID - Vanc continues   E coli UTI -completed 6 day course of antibiotics - f/u urine cx no growth   Pain management / withdrawal from high-dose opioids -stable w/ pain reasonably controlled - avoid escalating doses of narcotics given atx and poor inspiratory effort   Chronic Diastolic CHF -TTE 6/4 noted EF 55-60% and grade 1 DD - no volume overload on exam  Filed Weights   11/27/15 0423 11/28/15 0500 11/29/15 0400  Weight: 92.987 kg (205 lb) 94 kg (207 lb 3.7 oz) 89.631 kg (197 lb 9.6 oz)   D/c ivf on 6/15, resume home lasix, lisnopril  COPD -well compensated   HTN  -bp elevated, resume home meds lasix/lisinopril/metoprolol  Acute Renal Failure , cr peak at 1.59 -cr normalized after hydration and holding lasix/lisinopril -d/c ivf, resume lasix/lisinopril on 6/15   Chronic Urinary Retention, does self cath at home -likely secondary to spine abscess and bladder damage secondary chronic urinary retention/distention - daughter confirms pt is followed by a Urologist as outpt   Anemia -likely due to renal failure and chronic smoldering infection - Hgb stable     DM2 -5/31A1c 7.2 - CBG not yet at goal - adjust tx and follow   Hypokalemia -corrected   Hypomagnesemia -corrected   Neurogenic bladder, reported self catheterization, reported baseline wheelchair bound. Activity per neurosurgery.     DVT prophylaxis: SCDs/Subcutaneous heparin Code Status: FULL CODE Family Communication: no family present at time of exam  Disposition Plan: SNF when bed available  Consultants:  ID Neurosurgery   Procedures: 6/4 TTE EF 55-60% - grade 1 diastolic   dysfunction 6/6 PICC placed  6/9 anterior approach cervical spine surgery   Antimicrobials:  Zosyn 5/21 Cefepime 5/22 - 5/25 Rocephin 5/25 > 5/26 Vancomycin 5/22 >  Objective: Blood pressure 174/98, pulse 117, temperature 98.4 F (36.9 C), temperature source Oral,  resp. rate 16, height 6' 1" (1.854 m), weight 89.631 kg (197 lb 9.6 oz), SpO2 93 %.  Intake/Output Summary (Last 24 hours) at 12/01/15 1552 Last data filed at 12/01/15 0700  Gross per 24 hour  Intake   4876 ml  Output    250 ml  Net   4626 ml   Filed Weights   11/27/15 0423 11/28/15 0500 11/29/15 0400  Weight: 92.987 kg (205 lb) 94 kg (207 lb 3.7 oz) 89.631 kg (197 lb 9.6 oz)    Examination: General: no acute resp distress, on neck collar Lungs: good air movement th/o - no wheeze - no focal crackles  Cardiovascular: regular rate and rhythm w/o M Abdomen: soft, bowel sounds positive, no ascites, nontender Extremities: trace edema bilateral lower extremities  CBC:  Recent Labs Lab 11/25/15 1653 11/26/15 0459 11/27/15 0200 11/29/15 0500 12/01/15 0427  WBC  --  10.1 12.4* 7.2 9.3  HGB 10.2* 9.1* 9.4* 8.3* 8.3*  HCT 30.0* 30.7* 30.2* 27.6* 27.3*  MCV  --  86.2 84.4 85.4 83.5  PLT  --  214 229 238 290   Basic Metabolic Panel:  Recent Labs Lab 11/26/15 0459 11/27/15 0200 11/28/15 0439 11/29/15 0500 12/01/15 0427  NA 134* 131* 131* 137 136  K 4.9 4.1 3.8 3.7 3.5  CL 96* 93* 93* 100* 100*  CO2 29 29 27 27 25  GLUCOSE 139* 137* 179* 110* 150*  BUN 20 20 17 12 9  CREATININE 1.25* 1.27* 1.03 0.86 0.80  CALCIUM 9.3 9.3 9.3 9.7 9.5  PHOS  --   --  4.1  --   --    GFR: Estimated Creatinine Clearance: 116.5 mL/min (by C-G formula based on Cr of 0.8).  Liver Function Tests:  Recent Labs Lab 11/27/15 0200 11/28/15 0439  AST 12*  --   ALT 12*  --   ALKPHOS 101  --   BILITOT 0.4  --   PROT 7.1  --   ALBUMIN 2.6* 2.4*    HbA1C: HGB A1C MFR BLD  Date/Time Value Ref Range Status  11/16/2015 04:00 AM 7.2* 4.8 - 5.6 % Final    Comment:    (NOTE)         Pre-diabetes: 5.7 - 6.4         Diabetes: >6.4         Glycemic control for adults with diabetes: <7.0   04/14/2012 05:35 AM 7.5* <5.7 % Final    Comment:    (NOTE)                                                                        According to the ADA Clinical Practice Recommendations for 2011, when HbA1c is used as a screening test:  >=6.5%   Diagnostic of Diabetes Mellitus           (if abnormal result is confirmed) 5.7-6.4%   Increased risk of developing Diabetes Mellitus References:Diagnosis and Classification of Diabetes Mellitus,Diabetes Care,2011,34(Suppl 1):S62-S69   and Standards of Medical Care in         Diabetes - 2011,Diabetes Care,2011,34 (Suppl 1):S11-S61.    CBG:  Recent Labs Lab 11/30/15 1150 11/30/15 1639 11/30/15 2118 12/01/15 0627 12/01/15 1126  GLUCAP 158* 184* 125* 161* 144*    No results found for this or any previous visit (from the past 240 hour(s)).   Scheduled Meds: . collagenase   Topical Daily  . feeding supplement  1 Container Oral TID BM  . feeding supplement (PRO-STAT SUGAR FREE 64)  30 mL Oral BID  . furosemide  40 mg Oral BID  . heparin subcutaneous  5,000 Units Subcutaneous Q12H  . insulin aspart  0-20 Units Subcutaneous TID WC  . insulin aspart  0-5 Units Subcutaneous QHS  . insulin glargine  10 Units Subcutaneous QHS  . lisinopril  10 mg Oral Daily  . magnesium oxide  400 mg Oral BID  . metoprolol tartrate  25 mg Oral BID  . multivitamin with minerals  1 tablet Oral Daily  . oxyCODONE  60 mg Oral Q12H  . pantoprazole  40 mg Oral Daily  . PARoxetine  40 mg Oral Daily  . polyethylene glycol  17 g Oral BID  . potassium chloride  40 mEq Oral Q4H  . pregabalin  75 mg Oral Q8H  . senna-docusate  1 tablet Oral BID  . sodium chloride flush  10-40 mL Intracatheter Q12H  . sorbitol  15 mL Oral BID  . tamsulosin  0.4 mg Oral Daily  . vancomycin  1,500 mg Intravenous Q24H     LOS: 24 days   Time spent: 35 minutes    , MD PhD [phone] [facility] Office  [phone] Pager - Text Page per Amion as per below:   If 7PM-7AM, please contact night-coverage [email or web address] Password [email or web address] 12/01/2015, 3:52 PM      

## 2015-12-01 NOTE — Care Management Note (Signed)
Case Management Note  Patient Details  Name: [name] MRN: [id number] Date of Birth: 1960-09-23  Subjective/Objective:                    Action/Plan: Pt with pain control issues post surgery. Plan is for SNF at d/c. CM following for d/c needs.   Expected Discharge Date:  11/24/15               Expected Discharge Plan:  [place] (from [facility])  In-House Referral:  Clinical Social Work  Discharge planning Services  CM Consult  Post Acute Care Choice:  NA Choice offered to:  NA  DME Arranged:  N/A DME Agency:  NA  HH Arranged:  NA HH Agency:  NA  Status of Service:  Completed, signed off  Medicare Important Message Given:    Date Medicare IM Given:    Medicare IM give by:    Date Additional Medicare IM Given:    Additional Medicare Important Message give by:     If discussed at [place] of Stay Meetings, dates discussed:    Additional Comments:  Pollie Friar, RN 12/01/2015, 11:52 AM

## 2015-12-01 NOTE — Progress Notes (Signed)
[place] for Infectious Disease   Reason for visit: Follow up on discitis  Interval History:  Afebrile, no pain.    Physical Exam: Constitutional:  Filed Vitals:   12/01/15 0112 12/01/15 0507  BP: 152/97 156/99  Pulse: 107 104  Temp: 99.5 F (37.5 C) 98.6 F (37 C)  Resp: 16    patient appears in NAD Respiratory: Normal respiratory effort; CTA B Cardiovascular: RRR GI: soft, nt  Review of Systems: Constitutional: negative for fevers and chills Gastrointestinal: negative for diarrhea Musculoskeletal: negative for myalgias and arthralgias  Lab Results  Component Value Date   WBC 9.3 12/01/2015   HGB 8.3* 12/01/2015   HCT 27.3* 12/01/2015   MCV 83.5 12/01/2015   PLT 290 12/01/2015    Lab Results  Component Value Date   CREATININE 0.80 12/01/2015   BUN 9 12/01/2015   NA 136 12/01/2015   K 3.5 12/01/2015   CL 100* 12/01/2015   CO2 25 12/01/2015    Lab Results  Component Value Date   ALT 12* 11/27/2015   AST 12* 11/27/2015   ALKPHOS 101 11/27/2015     Microbiology: No results found for this or any previous visit (from the past 240 hour(s)).  Impression/Plan:  1. Discitis - day 25/56 of vancomycin.   Will need to continue through July 15th per SNF protocol Trough goal of 15-20 Twice weekly bmp, vancomycin trough  ESR >140 (5/26) Repeat ESR now and in 2 weeks We will arrange follow up in our clinic   2.  MRSA pneumonia - resolved, no current sob.   I will sign off, thanks for consult

## 2015-12-01 NOTE — Progress Notes (Signed)
Pharmacy Antibiotic Note  Gregory [name] is a 55 y.o. male admitted on 11/07/2015 with altered mental status.   He was found to have recurrent MRSA osteo/discitis. He continues on day#25/56 Vancomycin.  Vanc Trough goal  =15-20 mcg/ml. Renal function has improved back to baseline with SCr =0.80.  WBC wnl, Afebrile.  S/p C3-7 laminectomy with fusion for decompression of spinal cord ( part 2 of staged surgery)done on 6/13.    Plan: Continue Vancomycin to 1500 mg IV q24h  Follow-up SCr, BMET Vanc trough at least once weekly- check VT  after 3 doses of this dosage (vanc dose not charted on 6/13 surgery day). Can check VT 6/17 Sat or wait til 6/19 Monday unless renal function changes. ID, Dr. Linus Salmons notes plan to continue vanc through 7/15th per SNF protocol.    Height: 6\' 1"  (185.4 cm) Weight: 197 lb 9.6 oz (89.631 kg) IBW/kg (Calculated) : 79.9  Temp (24hrs), Avg:98.4 F (36.9 C), Min:97.5 F (36.4 C), Max:99.5 F (37.5 C)   Recent Labs Lab 11/25/15 1225 11/26/15 0459 11/27/15 0200 11/28/15 0439 11/28/15 1245 11/29/15 0500 12/01/15 0427  WBC  --  10.1 12.4*  --   --  7.2 9.3  CREATININE  --  1.25* 1.27* 1.03  --  0.86 0.80  VANCOTROUGH  --   --   --   --  12  --   --   VANCORANDOM 17  --   --   --   --   --   --     Estimated Creatinine Clearance: 116.5 mL/min (by C-G formula based on Cr of 0.8).    No Known Allergies  Antimicrobials this admission: 5/22 Vanc >>5/24; 5/26>> (7/21? - 8 weeks per ID) 5/22 Cefepime >>5/24 5/24 Ceftriaxone>>5/26  Dose adjustments this admission: - 5/30 VT 15 on 750 q12h -recheck next week - 6/6 VT 25 (but drawn 2 hours 45 mins early) so true trough is actually ~ 20 - 6/8 VT 36 >> doses held, will recheck a VR in 24 hours - 6/9 VT 17 >> pt specific ke~0.029, (estimated CrCl~30 ml/min based on kinetics) >> restart Vanc 1g/24h - 6/12 VT 12 (ordered by MD, drawn at 1245 prior to 1400 dose) >> dose adjusted to 1500 mg q24h   Microbiology  results: 5/22 BCx x2: negF 5/22 UCx: > 100k ecol MRSA PCR Pos 5/23 Urine >> negF  5/23 Blood x 2 NegF 5/25 BCx2>> ngtd  5/25 Urine >> negF 5/26 tracheal - abundant MRSA 6/3 blood x2 >> negF 6/3 urine >> neg F   Thank you for allowing pharmacy to be a part of this patient's care. Nicole Cella, RPh Clinical Pharmacist Pager: [phone] 12/01/2015 3:50 PM

## 2015-12-01 NOTE — Progress Notes (Signed)
[facility] unable to accept patient back due to no payor source (would not accept LOG). [facility] looking at referral with possibility of LOG.  Percell Locus Chanan Detwiler LCSWA [phone]

## 2015-12-02 ENCOUNTER — Encounter (HOSPITAL_COMMUNITY): Payer: Self-pay | Admitting: Neurosurgery

## 2015-12-02 LAB — GLUCOSE, CAPILLARY
GLUCOSE-CAPILLARY: 167 mg/dL — AB (ref 65–99)
Glucose-Capillary: 134 mg/dL — ABNORMAL HIGH (ref 65–99)

## 2015-12-02 LAB — BASIC METABOLIC PANEL
Anion gap: 9 (ref 5–15)
BUN: 7 mg/dL (ref 6–20)
CALCIUM: 9.6 mg/dL (ref 8.9–10.3)
CO2: 26 mmol/L (ref 22–32)
CREATININE: 0.77 mg/dL (ref 0.61–1.24)
Chloride: 102 mmol/L (ref 101–111)
GFR calc non Af Amer: >60 mL/min (ref >60)
Glucose, Bld: 146 mg/dL — ABNORMAL HIGH (ref 65–99)
Potassium: 3.7 mmol/L (ref 3.5–5.1)
SODIUM: 137 mmol/L (ref 135–145)

## 2015-12-02 LAB — MAGNESIUM: MAGNESIUM: 1.6 mg/dL — AB (ref 1.7–2.4)

## 2015-12-02 MED ORDER — INSULIN LISPRO 100 UNIT/ML ~~LOC~~ SOLN: SUBCUTANEOUS | Status: DC

## 2015-12-02 MED ORDER — COLLAGENASE 250 UNIT/GM EX OINT: TOPICAL_OINTMENT | Freq: Every day | CUTANEOUS | Status: DC

## 2015-12-02 MED ORDER — OXYCODONE HCL ER 60 MG PO T12A: 40.0000 mg | EXTENDED_RELEASE_TABLET | Freq: Two times a day (BID) | ORAL | Status: DC

## 2015-12-02 MED ORDER — OXYCODONE-ACETAMINOPHEN 5-325 MG PO TABS: 1.0000 | ORAL_TABLET | Freq: Three times a day (TID) | ORAL | Status: DC | PRN

## 2015-12-02 MED ORDER — SALINE SPRAY 0.65 % NA SOLN: 1.0000 | NASAL | Status: DC | PRN

## 2015-12-02 MED ORDER — TIZANIDINE HCL 2 MG PO CAPS: 2.0000 mg | ORAL_CAPSULE | Freq: Three times a day (TID) | ORAL | Status: DC | PRN

## 2015-12-02 MED ORDER — PRO-STAT SUGAR FREE PO LIQD: 30.0000 mL | Freq: Two times a day (BID) | ORAL | Status: DC

## 2015-12-02 MED ORDER — METOPROLOL TARTRATE 25 MG PO TABS
25.0000 mg | ORAL_TABLET | Freq: Once | ORAL | Status: AC
  Administered 2015-12-02: 25 mg via ORAL
  Filled 2015-12-02: qty 1

## 2015-12-02 MED ORDER — VANCOMYCIN HCL 10 G IV SOLR: INTRAVENOUS | Status: DC

## 2015-12-02 MED ORDER — TRAMADOL HCL 50 MG PO TABS: 50.0000 mg | ORAL_TABLET | Freq: Four times a day (QID) | ORAL | Status: DC | PRN

## 2015-12-02 MED ORDER — METFORMIN HCL 500 MG PO TABS: 500.0000 mg | ORAL_TABLET | Freq: Two times a day (BID) | ORAL | Status: DC

## 2015-12-02 MED ORDER — BOOST / RESOURCE BREEZE PO LIQD: 1.0000 | Freq: Three times a day (TID) | ORAL | Status: DC

## 2015-12-02 MED ORDER — SENNOSIDES-DOCUSATE SODIUM 8.6-50 MG PO TABS: 2.0000 | ORAL_TABLET | Freq: Every day | ORAL | Status: DC

## 2015-12-02 MED ORDER — MAGNESIUM SULFATE 2 GM/50ML IV SOLN
2.0000 g | Freq: Once | INTRAVENOUS | Status: DC
Start: 2015-12-02 — End: 2015-12-02
  Filled 2015-12-02 (×2): qty 50

## 2015-12-02 MED ORDER — PREGABALIN 75 MG PO CAPS: 75.0000 mg | ORAL_CAPSULE | Freq: Three times a day (TID) | ORAL | Status: DC

## 2015-12-02 MED ORDER — DIAZEPAM 5 MG PO TABS: 5.0000 mg | ORAL_TABLET | Freq: Three times a day (TID) | ORAL | Status: DC | PRN

## 2015-12-02 MED ORDER — OXYCODONE HCL ER 40 MG PO T12A: 40.0000 mg | EXTENDED_RELEASE_TABLET | Freq: Two times a day (BID) | ORAL | Status: DC

## 2015-12-02 MED ORDER — BISACODYL 10 MG RE SUPP: 10.0000 mg | Freq: Every day | RECTAL | Status: DC | PRN

## 2015-12-02 MED ORDER — POLYETHYLENE GLYCOL 3350 17 G PO PACK: 17.0000 g | PACK | Freq: Every day | ORAL | Status: DC

## 2015-12-02 MED ORDER — INSULIN GLARGINE 100 UNIT/ML ~~LOC~~ SOLN: 10.0000 [IU] | Freq: Every day | SUBCUTANEOUS | Status: DC

## 2015-12-02 MED ORDER — METOPROLOL TARTRATE 50 MG PO TABS: 50.0000 mg | ORAL_TABLET | Freq: Two times a day (BID) | ORAL | Status: DC

## 2015-12-02 MED ORDER — POTASSIUM CHLORIDE CRYS ER 20 MEQ PO TBCR
40.0000 meq | EXTENDED_RELEASE_TABLET | Freq: Once | ORAL | Status: AC
  Administered 2015-12-02: 40 meq via ORAL
  Filled 2015-12-02: qty 2

## 2015-12-02 MED ORDER — POTASSIUM CHLORIDE ER 10 MEQ PO TBCR: 20.0000 meq | EXTENDED_RELEASE_TABLET | Freq: Every day | ORAL | Status: DC

## 2015-12-02 NOTE — Progress Notes (Signed)
OT NOTE Pt with dc to snf summary and transport arriving prior to OT arrival. Ot unable to evaluation so all further therapy deferred to sNF.   Jeri Modena   OTR/L Pager: [phone] Office: [phone] .

## 2015-12-02 NOTE — Clinical Social Work Placement (Signed)
   CLINICAL SOCIAL WORK PLACEMENT  NOTE  Date:  12/02/2015  Patient Details  Name: Gregory [name] MRN: [id number] Date of Birth: 10-22-1960  Clinical Social Work is seeking post-discharge placement for this patient at the [place] level of care (*CSW will initial, date and re-position this form in  chart as items are completed):  Yes   Patient/family provided with [facility] Work Department's list of facilities offering this level of care within the geographic area requested by the patient (or if unable, by the patient's family).  Yes   Patient/family informed of their freedom to choose among providers that offer the needed level of care, that participate in Medicare, Medicaid or managed care program needed by the patient, have an available bed and are willing to accept the patient.  Yes   Patient/family informed of [facility]'s ownership interest in [facility] and [facility], as well as of the fact that they are under no obligation to receive care at these facilities.  PASRR submitted to EDS on 11/21/15     PASRR number received on 11/21/15     Existing PASRR number confirmed on       FL2 transmitted to all facilities in geographic area requested by pt/family on 11/21/15     FL2 transmitted to all facilities within larger geographic area on       Patient informed that his/her managed care company has contracts with or will negotiate with certain facilities, including the following:        Yes   Patient/family informed of bed offers received.  Patient chooses bed at [facility]     Physician recommends and patient chooses bed at      Patient to be transferred to [facility] on 12/02/15.  Patient to be transferred to facility by ptar     Patient family notified on 12/02/15 of transfer.  Name of family member notified:  katie     PHYSICIAN Please sign FL2      Additional Comment:    _______________________________________________ Cranford Mon, LCSW 12/02/2015, 12:11 PM

## 2015-12-02 NOTE — Care Management Note (Signed)
Case Management Note  Patient Details  Name: Naseem Stephan MRN: [id number] Date of Birth: [birth year]  Subjective/Objective:                    Action/Plan: Plan is for patient to discharge to [facility] SNF today. No further needs per CM.   Expected Discharge Date:  11/24/15               Expected Discharge Plan:  [place] (from [facility])  In-House Referral:  Clinical Social Work  Discharge planning Services  CM Consult  Post Acute Care Choice:  NA Choice offered to:  NA  DME Arranged:  N/A DME Agency:  NA  HH Arranged:  NA HH Agency:  NA  Status of Service:  Completed, signed off  Medicare Important Message Given:    Date Medicare IM Given:    Medicare IM give by:    Date Additional Medicare IM Given:    Additional Medicare Important Message give by:     If discussed at [place] of Stay Meetings, dates discussed:    Additional Comments:  Pollie Friar, RN 12/02/2015, 11:04 AM

## 2015-12-02 NOTE — Progress Notes (Signed)
D/C orders received, pt for D/C to SNF today.  IV and telemetry D/C.  Rx and D/C instructions given with verbalized understanding. EMS brought pt downstairs via stretcher.

## 2015-12-02 NOTE — Progress Notes (Signed)
Patient will discharge to [name] Anticipated discharge date: 6/16 Family notified: pt dtr Joellen [place] Transportation by [facility]- scheduled for 1pm  CSW signing off.  Domenica Reamer, [place] Social Worker [phone]

## 2015-12-02 NOTE — Discharge Summary (Addendum)
Discharge Summary  Gregory [name] KJZ:[id number] DOB: 03-31-1961  PCP: Tamsen Roers, MD  Admit date: 11/07/2015 Discharge date: 12/02/2015  Time spent: >33mns Prolonged and complicated hospitalization  Recommendations for Outpatient Follow-up:  1. F/u with MD at SNF  repeat cbc/bmp at follow up, continue iv vanc and picc line care,  snf to continue taper opioids pain meds. 2. F/u with neurosurgery in 4 weeks , s/p c spine decompression surgery, keep neck collar on till seen by neurosurgery 3. F/u with infectious  4. [facility] Dr oLoel Loftyfor chronic urinary retention and foley care. Frequency of changing foley per urology direction  Discharge Diagnoses:  Active Hospital Problems   Diagnosis Date Noted  . Abscess in epidural space of lumbar spine   . Osteomyelitis of cervical spine (HFing[place]   . Cervical discitis   . Sepsis due to methicillin resistant Staphylococcus aureus (MRSA) (HDripping Springs   . CAP (community acquired pneumonia) due to MRSA (methicillin resistant Staphylococcus aureus) (HStock[place]   . Escherichia coli urinary tract infection   . Chronic diastolic CHF (congestive heart failure) (HM[place]   . Pulmonary hypertension (HRein[place]   . Essential hypertension   . HLD (hyperlipidemia)   . Acute renal failure (H[place]   . Urinary retention   . Uncontrolled type 2 diabetes mellitus with complication (HM[place]   . Discitis of cervical region   . Synovial cyst of lumbar spine   . Weakness of both lower extremities   . Foot drop, bilateral   . Pneumonia of both lower lobes due to methicillin resistant Staphylococcus aureus (MRSA) (HH[place]old[place]   . Opioid use with withdrawal (HFairfield Bay   . Acute respiratory failure with hypoxia (HR[place]   . Encounter for central line placement   . Encounter for orogastric (OG) tube placement   . Endotracheally intubated   . Acute respiratory failure with hypoxemia (HSand[place]   . Sepsis (HBeggs   . UTI (lower urinary tract infection)   . Osteomyelitis (HW[place]   . Pressure ulcer  11/10/2015  . Septic shock (HCamdenton 11/07/2015  . Altered mental status     Resolved Hospital Problems   Diagnosis Date Noted Date Resolved  No resolved problems to display.    Discharge Condition: stable  Diet recommendation: heart healthy/carb modified  Filed Weights   11/27/15 0423 11/28/15 0500 11/29/15 0400  Weight: 92.987 kg (205 lb) 94 kg (207 lb 3.7 oz) 89.631 kg (197 lb 9.6 oz)    History of present illness:   55yo male brought to ER with altered mental status. [facility] He was hypoxic, diaphoretic. He was given narcan and then became agitated. He was given ativan, and became sedated. He was noted to have fever, low BP. He was intubated for airway protection. He had purulent appearing urine. He was started on antibiotics, IV fluids, and pressors. He is admitted to icu intubated and on pressor. He failed extubation on 5/27 and had to be reintubated on 5/27, he was eventually extubated on 5/30 and transferred to hospitalist service on 6/1    He has a Past medical history of COPD, diabetes, hypertension, and chronic low back pain who has recently finished treatment for protracted MRSA bacteremia and multifocal osteomyelitis including cervical and lumbar spine, left shoulder and L foot in on May 1st. [facility]. He was found to have bacteremia from 2/13-2/25. [facility]. He was found  to have multiple sources suggesting osteomyelitis though did not undergo debridement. His hospital stay was complicated by respiratory failure requiring intubation 2/2 possible oversedation vs worsening pneumonia. He was discharged to SNF for prolonged abtx therapy which ended on may 1st, and had catheter removed by IR on 5/11. It does not look like he had further abtx. He now [facility] to [facility] on 5/22 with AMS though to be due to opiates,  hypoxia, and high fever which have persisted despite abtx. Infectious work up, purulent looking urine, has only shown ecoli urine cx ( S imi, Pip/tazo, gent, cefazolin, ctx).  ecoli uti fully treated in the hospital.  Repeat imaging of spine MRI suggests ongoing destruction of cervical spine. ID asked to provide input/recs on abtx management neurosurgery consulted, patient eventually had cervical spine decompression surgery in two stage on 6/9 and 6/13 after medically stable.   Pain management, opioids taper started from the hospital, SNF to continue taper down opioids.  Hospital Course:  Active Problems:   Septic shock (HCC)   Altered mental status   Pressure ulcer   Acute respiratory failure with hypoxemia (HCC)   Sepsis (HCC)   UTI (lower urinary tract infection)   Osteomyelitis (HCC)   Encounter for central line placement   Encounter for orogastric (OG) tube placement   Endotracheally intubated   Osteomyelitis of cervical spine (HCC)   Cervical discitis   Sepsis due to methicillin resistant Staphylococcus aureus (MRSA) ([place])   CAP (community acquired pneumonia) due to MRSA (methicillin resistant Staphylococcus aureus) ([place])   Escherichia coli urinary tract infection   Chronic diastolic CHF (congestive heart failure) (HCC)   Pulmonary hypertension (HCC)   Essential hypertension   HLD (hyperlipidemia)   Acute renal failure (HCC)   Urinary retention   Uncontrolled type 2 diabetes mellitus with complication (HCC)   Discitis of cervical region   Synovial cyst of lumbar spine   Weakness of both lower extremities   Foot drop, bilateral   Pneumonia of both lower lobes due to methicillin resistant Staphylococcus aureus (MRSA) (HCC)   Opioid use with withdrawal (HCC)   Acute respiratory failure with hypoxia (HCC)   Abscess in epidural space of lumbar spine    Assessment & Plan:  MRSA Osteomyelitis /discitis C5-6 & L4-5  - remains on vancomycin - Neurosurgery following - s/p  staged cervical decompression/stabilization - on 6/9 and 6/13 by Dr. Kathyrn Sheriff. - Antibiotics management per ID ,day 25/56 of vancomycin as of 6/15, plant to continue iv vanc through 7/15th. Trough goal of 15-20, Twice weekly bmp, vancomycin trough , repeat ESR in 2 weeks -Do not lift arm above shoulder, do not lift weight above 10pounds per neurosrugery, keep neck collar on for another 4weeks. F/u with Dr Kathyrn Sheriff in 4 weeks   Acute hypoxic respiratory failure required intubation -likely due to atelectasis/poor inspiratory effort /septic shock- -resolved, now on room air  -avoid over sedation, continue taper oipioids pain meds- encouraged IS use   Bilateral lower extremity weakness / bilateral foot drop/ reported has been wheelchair bound for the last 84month -due to above - NS following - appears to be improving , continue pt/ot  Sepsis and septic shock requiring intubation and pressor and icu admission initially due to Tracheobronchitis/ mrsa osteomyelitis/discitis/ecoli uti -abx per ID - Vanc continues   E coli UTI -completed 6 day course of antibiotics - f/u urine cx no growth   Pain management / withdrawal from high-dose opioids -stable w/ pain reasonably controlled - avoid escalating doses  of narcotics given atx and poor inspiratory effort  Continue taper oipiods at snf,   Chronic Diastolic CHF -TTE 6/4 noted EF 55-60% and grade 1 DD - no volume overload on exam  Filed Weights   11/27/15 0423 11/28/15 0500 11/29/15 0400  Weight: 92.987 kg (205 lb) 94 kg (207 lb 3.7 oz) 89.631 kg (197 lb 9.6 oz)   D/c ivf on 6/15, resume home lasix, lisnopril, betablocker  COPD -well compensated , lung clear at discharge, on room air  HTN  -bp meds held initially due to septic shock - resume home meds lasix/lisinopril/metoprolol  Acute Renal Failure , cr peak at 1.59 -cr normalized after hydration and holding lasix/lisinopril -d/c ivf, resume lasix/lisinopril on 6/15    Chronic Urinary Retention, does self cath at home -likely secondary to spine abscess and bladder damage secondary chronic urinary retention/distention - daughter confirms pt is followed by a Urologist as outpt   Anemia -likely due to renal failure and chronic smoldering infection -s/p prbc transfusion on 5/24 and 5/27 Hgb stable   Insulin dependent DM2 -5/31A1c 7.2 -  He is discharged on lantus 10units, resumed glipizide and started metformin, resume home med victoza as well snf md to continue close monitor blood sugar and continue to adjust meds   Hypokalemia -corrected , discharged on k supplement  Hypomagnesemia -corrected , continue oral mag supplement  Neurogenic bladder, reported self catheterization, reported baseline wheelchair bound. Keep foley in ,  Need follow up with urology      DVT prophylaxis: SCDs/Subcutaneous heparin while in the hospital Code Status: FULL CODE Family Communication: talked to daughter Joellen Jersey who is a Therapist, sports at [employer] long over the phone [phone] on 6/16 Disposition Plan: SNF   Consultants:  Admitted to critical care on 5/22 due to altered mental status and hypoxic respiratory failure, he was intubated on admission , transferred to hospitalist service on 6/2 ID Neurosurgery    Antimicrobials:  Zosyn 5/21 Cefepime 5/22 - 5/25 Rocephin 5/25 > 5/26 Vancomycin 5/22 >        Procedures: 6/4 TTE EF 55-60% - grade 1 diastolic dysfunction 6/9 anterior approach cervical spine surgery 6/13 posterior cervical laminectomy  *LINES/TUBES: Intubated on 5/22 OETT 7.5 5/22 - 5/27; failed extubation and reintubated on 5/27 >>5/30 extubated on 5/30 L IJ CVL 5/22 >>fever removed on 6/1 picc line from 6/6 for long term iv vanc   Discharge Exam: BP 166/98 mmHg  Pulse 104  Temp(Src) 98.8 F (37.1 C) (Oral)  Resp 16  Ht _0  (1.854 m)  Wt 89.631 kg (197 lb 9.6 oz)  BMI 26.08 kg/m2  SpO2 94%  General: aaoc3, neck collar on, picc line  right arm Cardiovascular: rrr Respiratory: CTABL Extremity: right toe dressing intact Neuro: aaox3, sensation intact, paraplegia lower extremity  Discharge Instructions You were cared for by a hospitalist during your hospital stay. If you have any questions about your discharge medications or the care you received while you were in the hospital after you are discharged, you can call the unit and asked to speak with the hospitalist on call if the hospitalist that took care of you is not available. Once you are discharged, your primary care physician will handle any further medical issues. Please note that NO REFILLS for any discharge medications will be authorized once you are discharged, as it is imperative that you return to your primary care physician (or establish a relationship with a primary care physician if you do not have one) for  your aftercare needs so that they can reassess your need for medications and monitor your lab values.      Discharge Instructions    Diet - low sodium heart healthy    Complete by:  As directed   Carb modified     Increase activity slowly    Complete by:  As directed             Medication List    STOP taking these medications        docusate sodium 100 MG capsule  Commonly known as:  COLACE     omeprazole 20 MG capsule  Commonly known as:  PRILOSEC     tiZANidine 4 MG tablet  Commonly known as:  ZANAFLEX  Replaced by:  tizanidine 2 MG capsule      TAKE these medications        acetaminophen 325 MG tablet  Commonly known as:  TYLENOL  Take 650 mg by mouth every 6 (six) hours as needed for mild pain.     albuterol 108 (90 Base) MCG/ACT inhaler  Commonly known as:  PROVENTIL HFA;VENTOLIN HFA  Inhale 2 puffs into the lungs every 6 (six) hours as needed for wheezing or shortness of breath.     bisacodyl 10 MG suppository  Commonly known as:  DULCOLAX  Place 1 suppository (10 mg total) rectally daily as needed for moderate constipation.      collagenase ointment  Commonly known as:  SANTYL  Apply topically daily.     DECUBI-VITE PO  Take 1 tablet by mouth daily.     diazepam 5 MG tablet  Commonly known as:  VALIUM  Take 1 tablet (5 mg total) by mouth every 8 (eight) hours as needed for anxiety or muscle spasms.     feeding supplement (PRO-STAT SUGAR FREE 64) Liqd  Take 30 mLs by mouth 3 (three) times daily with meals.     feeding supplement (PRO-STAT SUGAR FREE 64) Liqd  Take 30 mLs by mouth 2 (two) times daily.     feeding supplement Liqd  Take 1 Container by mouth 3 (three) times daily between meals.     furosemide 40 MG tablet  Commonly known as:  LASIX  Take 40 mg by mouth 2 (two) times daily.     glimepiride 2 MG tablet  Commonly known as:  AMARYL  Take 2 mg by mouth daily with breakfast.     glucagon (human recombinant) 1 MG injection  Commonly known as:  GLUCAGEN  Inject 1 mg into the vein once as needed for low blood sugar.     insulin glargine 100 UNIT/ML injection  Commonly known as:  LANTUS  Inject 0.1 mLs (10 Units total) into the skin at bedtime.     insulin lispro 100 UNIT/ML injection  Commonly known as:  HUMALOG  Before each meal 3 times a day, 140-199 - 2 units, 200-250 - 4 units, 251-299 - 6 units,  300-349 - 8 units,  350 or above 10 units. Insulin PEN if approved, provide syringes and needles if needed.     lisinopril 10 MG tablet  Commonly known as:  PRINIVIL,ZESTRIL  Take 10 mg by mouth daily.     magnesium oxide 400 MG tablet  Commonly known as:  MAG-OX  Take 400 mg by mouth 2 (two) times daily.     metFORMIN 500 MG tablet  Commonly known as:  GLUCOPHAGE  Take 1 tablet (500 mg total) by mouth 2 (two) times daily  with a meal.     metoprolol succinate 50 MG 24 hr tablet  Commonly known as:  TOPROL-XL  Take 50 mg by mouth daily. Take with or immediately following a meal.     oxyCODONE 40 mg 12 hr tablet  Commonly known as:  OXYCONTIN  Take 1 tablet (40 mg total) by mouth  every 12 (twelve) hours.     oxyCODONE-acetaminophen 5-325 MG tablet  Commonly known as:  ROXICET  Take 1 tablet by mouth every 8 (eight) hours as needed for moderate pain or severe pain.     PARoxetine 40 MG tablet  Commonly known as:  PAXIL  Take 40 mg by mouth every morning.     polyethylene glycol packet  Commonly known as:  MIRALAX / GLYCOLAX  Take 17 g by mouth daily. Hold if diarrhea     potassium chloride 10 MEQ tablet  Commonly known as:  K-DUR  Take 2 tablets (20 mEq total) by mouth daily.     pregabalin 75 MG capsule  Commonly known as:  LYRICA  Take 1 capsule (75 mg total) by mouth every 8 (eight) hours.     senna-docusate 8.6-50 MG tablet  Commonly known as:  Senokot-S  Take 2 tablets by mouth at bedtime.     sodium chloride 0.65 % Soln nasal spray  Commonly known as:  OCEAN  Place 1 spray into both nostrils as needed for congestion.     tamsulosin 0.4 MG Caps capsule  Commonly known as:  FLOMAX  Take 0.4 mg by mouth daily.     tizanidine 2 MG capsule  Commonly known as:  ZANAFLEX  Take 1 capsule (2 mg total) by mouth 3 (three) times daily as needed for muscle spasms.     traMADol 50 MG tablet  Commonly known as:  ULTRAM  Take 1 tablet (50 mg total) by mouth every 6 (six) hours as needed for moderate pain.     vancomycin in sodium chloride 0.9 % 500 mL  Will need to continue iv vanc per pharmacy  through July 15th per SNF protocol Trough goal of 15-20 Twice weekly bmp, vancomycin trough  repeat ESR  in 2 weeks Report result to infectious disease     VICTOZA 18 MG/3ML Sopn  Generic drug:  Liraglutide  Inject 1.2 mg into the skin 2 (two) times daily.       No Known Allergies Follow-up Information    Follow up with [facility] .   Specialty:  [place] information:   [address] [place] [id number]      Follow up with Consuella Lose, C, MD In 4 weeks.    Specialty:  Neurosurgery   Why:  s/p c spine decompression surgery, keep neck collar on till seen  by neurosurgery.   Contact information:   [address] [place] [address] [phone]       Follow up with [facility]             .   Why:  mrsa osteomyelitis on iv vanc   Contact information:   [address] [place] [address]       Follow up with Claybon Jabs, MD In 2 weeks.   Specialty:  Urology   Why:  urinary retention and foley care   Contact information:   [place] [address] [phone]  The results of significant diagnostics from this hospitalization (including imaging, microbiology, ancillary and laboratory) are listed below for reference.    Significant Diagnostic Studies: Ct Abdomen Pelvis Wo Contrast  11/10/2015  CLINICAL DATA:  55 year old male with history of urinary tract infection. Ongoing fever. Evaluate for potential abscess. EXAM: CT ABDOMEN AND PELVIS WITHOUT CONTRAST TECHNIQUE: Multidetector CT imaging of the abdomen and pelvis was performed following the standard protocol without IV contrast. COMPARISON:  CT of the abdomen and pelvis 08/01/2015. FINDINGS: Lower chest: Bibasilar opacities in the lungs appear to be predominantly atelectatic, however, resolving areas of airspace consolidation could have a similar appearance. Trace right pleural effusion lying dependently. Hepatobiliary: No definite cystic or solid hepatic lesions are identified in the liver on today's noncontrast CT examination. Unenhanced appearance of the gallbladder is unremarkable. Pancreas: No pancreatic mass or peripancreatic inflammatory changes on today's noncontrast CT examination. Spleen: Spleen is enlarged measuring 15.0 x 5.8 x 15.1 cm (estimated splenic volume of 657 mL). Adrenals/Urinary Tract: 2 mm calcification in the upper pole of the right kidney is favored to be vascular, but  could alternatively reflect a small nonobstructive calculus. No other potential calculi are noted within the collecting system of the left kidney, along the course of either ureter or within the lumen of the urinary bladder. No hydroureteronephrosis to indicate urinary tract obstruction at this time. Mild bilateral perinephric stranding (nonspecific). Unenhanced appearance of the kidneys is otherwise unremarkable. Urinary bladder is nearly completely decompressed with a Foley balloon catheter in place. Bilateral adrenal glands are normal in appearance. Stomach/Bowel: Unenhanced appearance of the stomach is normal. There is no pathologic dilatation of small bowel or colon. Normal appendix. Vascular/Lymphatic: Atherosclerotic calcifications throughout the abdominal and pelvic vasculature, without evidence of aneurysm. No lymphadenopathy noted in the abdomen or pelvis on today's noncontrast CT examination. Several borderline enlarged retroperitoneal lymph nodes are noted, measuring up to 9 mm in short axis in the left para-aortic nodal station (nonspecific). Reproductive: Prostate gland and seminal vesicles are unremarkable in appearance. Other: Soft tissue stranding around the urinary bladder, likely inflammatory. No well-defined fluid collection identified in the peritoneal cavity to suggest abscess at this time. No significant volume of ascites. No pneumoperitoneum. Musculoskeletal: There are no aggressive appearing lytic or blastic lesions noted in the visualized portions of the skeleton. IMPRESSION: 1. No abscess identified in the abdomen or pelvis. 2. Inflammatory changes in the fat adjacent to the urinary bladder, compatible with the reported clinical history of urinary tract infection. 3. Probable vascular calcification versus tiny 2 mm nonobstructive calculus in the upper pole of the right kidney. 4. Normal appendix. 5. Splenomegaly. 6. Areas of atelectasis or resolving airspace consolidation in the lung bases  bilaterally with trace right pleural effusion. 7. Atherosclerosis. Electronically Signed   By: Vinnie Langton M.D.   On: 11/10/2015 08:24   Dg Cervical Spine 1 View  11/25/2015  CLINICAL DATA:  Anterior C5-6 corpectomy and fixation. EXAM: CERVICAL SPINE 1 VIEW COMPARISON:  MRI of 11/11/2015 FINDINGS: Anterior fixation beginning at C4 and extending through the C7 level. C5-6 corpectomy. No acute hardware complication identified. More inferiorly is not imaged. IMPRESSION: Intraoperative imaging. Electronically Signed   By: Abigail Miyamoto M.D.   On: 11/25/2015 20:39   Dg Cervical Spine 2 Or 3 Views  11/28/2015  CLINICAL DATA:  56 year old male with a history of neck pain after surgery EXAM: CERVICAL SPINE - 2-3 VIEW COMPARISON:  Intraoperative fluoroscopic spot images 11/25/2015 FINDINGS: Postoperative changes of C5 corpectomy with anterior  plate and screw fixation spanning C4-C6. Note that the lower levels are not well evaluated secondary to overlying soft tissues. Expandable titanium cage appears well positioned. Significant prevertebral soft tissue swelling. Gas anterior to the C4 level. Compared to the intraoperative study, there is mild kyphosis. IMPRESSION: Postoperative changes of C5 corpectomy with anterior plate and screw fixation of C4-C6. Note that C6 and below not well visualized secondary to overlying soft tissues. Compared to the intraoperative fluoroscopic image there is mild increased kyphosis. Significant prevertebral soft tissue swelling and gas, favored to be postoperative changes. Signed, Dulcy Fanny. Earleen Newport, DO [facility] Radiology Electronically Signed   By: Corrie Mckusick D.O.   On: 11/28/2015 15:27   Ct Head Wo Contrast  11/07/2015  CLINICAL DATA:  Acute onset of lethargy and unresponsiveness. Initial encounter. EXAM: CT HEAD WITHOUT CONTRAST TECHNIQUE: Contiguous axial images were obtained from the base of the skull through the vertex without  intravenous contrast. COMPARISON:  CT of the head performed 04/08/2012, and MRI of the brain performed 04/15/2012 FINDINGS: There is no evidence of acute infarction, mass lesion, or intra- or extra-axial hemorrhage on CT. Mild periventricular white matter change likely reflects small vessel ischemic microangiopathy. The posterior fossa, including the cerebellum, brainstem and fourth ventricle, is within normal limits. The third and lateral ventricles, and basal ganglia are unremarkable in appearance. The cerebral hemispheres are symmetric in appearance, with normal gray-white differentiation. No mass effect or midline shift is seen. There is no evidence of fracture; visualized osseous structures are unremarkable in appearance. The orbits are within normal limits. The paranasal sinuses and mastoid air cells are well-aerated. No significant soft tissue abnormalities are seen. IMPRESSION: 1. No acute intracranial pathology seen on CT. 2. Mild small vessel ischemic microangiopathy. Electronically Signed   By: Garald Balding M.D.   On: 11/07/2015 03:44   Mr Cervical Spine W Wo Contrast  11/11/2015  CLINICAL DATA:  Urinary tract infection, persistent fever. History of MRSA osteomyelitis and bacteremia, diabetes, hypertension. EXAM: MRI CERVICAL SPINE WITHOUT AND WITH CONTRAST TECHNIQUE: Multiplanar and multiecho pulse sequences of the cervical spine, to include the craniocervical junction and cervicothoracic junction, were obtained according to standard protocol without and with intravenous contrast. Patient was unable to tolerate further imaging, and post gadolinium axial sequences not obtained. CONTRAST:  20 cc MultiHance COMPARISON:  None. FINDINGS: ALIGNMENT: Focal kyphosis C5-6.  No malalignment. VERTEBRAE/DISCS: Destructive C5-6 endplate changes and mild height loss associated with obliterated disc and effusion, associated abnormal enhancement within the C5-6 endplates and disc. The remaining intervertebral discs  demonstrate relatively preserved morphology and signal. Mild chronic discogenic endplate changes at multiple levels. No STIR signal abnormality to suggest fracture. CORD:Mild T2 bright cord edema C4-5 through C6-7 without syrinx. No abnormal cord enhancement. POSTERIOR FOSSA, VERTEBRAL ARTERIES, PARASPINAL TISSUES: Minimal dorsal dural enhancement at C5-6 is well as trace is ventral epidural fluid collection at C5-6 compatible with abscess. Enhancing prevertebral phlegmon. Enhancing bright STIR signal within the C5-6 interspinous space extending to the paraspinal soft tissues compatible with myositis. Life-support lines in place. Vertebral artery flow voids present. DISC LEVELS: C2-3: Uncovertebral hypertrophy without canal stenosis. Mild RIGHT neural foraminal narrowing. C3-4: Small RIGHT central to subarticular disc protrusion. Uncovertebral hypertrophy. Mild canal stenosis. Moderate to severe RIGHT and moderate LEFT neural foraminal narrowing. C4-5: Annular bulging, uncovertebral hypertrophy and tiny central disc protrusion. Mild canal stenosis. Moderate RIGHT neural foraminal narrowing. C5-6: Retropulsed disc material and uncal vertebral hypertrophy results in severe canal stenosis: AP  dimension of the canal is 6 mm. Severe bilateral neural foraminal narrowing. C6-7: Small broad-based disc bulge, uncovertebral hypertrophy resulting in moderate canal stenosis. Severe bilateral neural foraminal narrowing. C7-T1: Small LEFT subarticular disc protrusion. No canal stenosis. Moderate to severe LEFT neural foraminal narrowing. 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 Associated paraspinal muscle edema/myositis. Severe canal stenosis at C5-6. C4-5 through C6-7 spinal cord edema/pre syrinx. No abnormal enhancement of the spinal cord. Moderate canal stenosis at C6-7, mild at C3-4 and C4-5. Multilevel neural foraminal narrowing: Severe at C5-6 and C6-7. Electronically Signed   By:  Elon Alas M.D.   On: 11/11/2015 05:51   Mr Thoracic Spine W Wo Contrast  11/11/2015  CLINICAL DATA:  Urinary tract infection, persistent fever. History of MRSA osteomyelitis and bacteremia, diabetes, hypertension. EXAM: MRI THORACIC AND LUMBAR SPINE WITHOUT AND WITH CONTRAST TECHNIQUE: Multiplanar and multiecho pulse sequences of the thoracic and lumbar spine were obtained without and with intravenous contrast. CONTRAST:  20 cc MultiHance COMPARISON:  CT abdomen and pelvis Nov 10, 2015 FINDINGS: MRI THORACIC SPINE FINDINGS (marked artifact, possibly related life-support lines in 3 tesla environment) ALIGNMENT: Maintenance of the thoracic kyphosis. No malalignment. VERTEBRAE/DISCS: Vertebral bodies are intact. Intervertebral disc morphology's and signal are normal. No abnormal osseous or intradiscal enhancement. CORD: Thoracic spinal cord is normal morphology and signal characteristics to the level the conus medullaris which terminates at T12-L1. PREVERTEBRAL AND PARASPINAL SOFT TISSUES: Small pleural effusions and enhancing probable atelectasis or, possible pneumonia. DEGENERATIVE CHANGE: LEFT central T7-8 moderate disc protrusion resulting in mild canal stenosis. Limited assessment of neural foraminal narrowing due to poor image quality. MRI LUMBAR SPINE FINDINGS (low signal to noise ratio, further degraded by mild patient motion.) OSSEOUS STRUCTURES: Lumbar vertebral bodies intact. Grade 1 L4-5 anterolisthesis associated with bright STIR signal and enhancing bone marrow signal within the RIGHT L4-5 facet. Mild congenital canal narrowing on the basis of foreshortened pedicles. Mild L3-4, moderate L4-5 and mild L5-S1 disc height loss with decreased T2 signal within the lower lumbar disc compatible with mild desiccation. Mild acute on chronic discogenic endplate changes at I6-9. No STIR signal abnormality to suggest fracture. No vertebral body or intradiscal enhancement. SPINAL CORD: Conus medullaris  terminates at T12 and appears normal morphology and signal characteristics. Central displacement of the cauda equina due to canal stenosis. No abnormal cord, leptomeningeal enhancement. SOFT TISSUES: Bright STIR signal versus artifact within the RIGHT psoas muscle. Paraspinal bright interstitial STIR signal. LEVEL BY LEVEL EVALUATION: L1-2: Annular bulging without canal stenosis or neural foraminal narrowing. L2-3: Annular bulging. Mild facet arthropathy and ligamentum flavum redundancy without canal stenosis. Mild LEFT neural foraminal narrowing. L3-4: Large 5 mm broad-based disc bulge. Moderate facet arthropathy and ligamentum flavum redundancy. Severe canal stenosis. Moderate to severe bilateral neural foraminal narrowing. L4-5: Anterolisthesis. Moderate broad-based disc bulge. Severe facet arthropathy and ligamentum flavum redundancy with enhancing RIGHT facet. Severe canal stenosis. Severe RIGHT greater than LEFT neural foraminal narrowing. Abnormal signal within the RIGHT L4-5 lateral recess extend toward the RIGHT L5-S1 neural foramen, contiguous with the RIGHT facet with superimposed enhancement. 6 mm RIGHT facet synovial cyst, less likely abscess. Trace component extensive RIGHT ligamentum flavum. Bright STIR signal within the L4-5 interspinous space. L5-S1: Small broad-based disc bulge eccentric laterally. Mild facet arthropathy and ligamentum flavum redundancy without canal stenosis. Moderate RIGHT, mild LEFT neural foraminal narrowing. IMPRESSION: MRI THORACIC SPINE: No MR findings of infection within the thoracic spine. Moderate LEFT central T7-8 disc protrusion resulting in  mild canal stenosis. MRI LUMBAR SPINE: Grade 1 L4-5 anterolisthesis with severe L4-5 RIGHT facet arthropathy, inflammation versus infectious bone marrow changes and facet cyst versus abscess. Possible RIGHT psoas muscle myositis. Severe canal stenosis L3-4 and L4-5. Severe L4-5 and moderate to severe L3-4 neural foraminal narrowing.  Electronically Signed   By: Elon Alas M.D.   On: 11/11/2015 06:13   Mr Lumbar Spine W Wo Contrast  11/11/2015  CLINICAL DATA:  Urinary tract infection, persistent fever. History of MRSA osteomyelitis and bacteremia, diabetes, hypertension. EXAM: MRI THORACIC AND LUMBAR SPINE WITHOUT AND WITH CONTRAST TECHNIQUE: Multiplanar and multiecho pulse sequences of the thoracic and lumbar spine were obtained without and with intravenous contrast. CONTRAST:  20 cc MultiHance COMPARISON:  CT abdomen and pelvis Nov 10, 2015 FINDINGS: MRI THORACIC SPINE FINDINGS (marked artifact, possibly related life-support lines in 3 tesla environment) ALIGNMENT: Maintenance of the thoracic kyphosis. No malalignment. VERTEBRAE/DISCS: Vertebral bodies are intact. Intervertebral disc morphology's and signal are normal. No abnormal osseous or intradiscal enhancement. CORD: Thoracic spinal cord is normal morphology and signal characteristics to the level the conus medullaris which terminates at T12-L1. PREVERTEBRAL AND PARASPINAL SOFT TISSUES: Small pleural effusions and enhancing probable atelectasis or, possible pneumonia. DEGENERATIVE CHANGE: LEFT central T7-8 moderate disc protrusion resulting in mild canal stenosis. Limited assessment of neural foraminal narrowing due to poor image quality. MRI LUMBAR SPINE FINDINGS (low signal to noise ratio, further degraded by mild patient motion.) OSSEOUS STRUCTURES: Lumbar vertebral bodies intact. Grade 1 L4-5 anterolisthesis associated with bright STIR signal and enhancing bone marrow signal within the RIGHT L4-5 facet. Mild congenital canal narrowing on the basis of foreshortened pedicles. Mild L3-4, moderate L4-5 and mild L5-S1 disc height loss with decreased T2 signal within the lower lumbar disc compatible with mild desiccation. Mild acute on chronic discogenic endplate changes at J1-9. No STIR signal abnormality to suggest fracture. No vertebral body or intradiscal enhancement. SPINAL  CORD: Conus medullaris terminates at T12 and appears normal morphology and signal characteristics. Central displacement of the cauda equina due to canal stenosis. No abnormal cord, leptomeningeal enhancement. SOFT TISSUES: Bright STIR signal versus artifact within the RIGHT psoas muscle. Paraspinal bright interstitial STIR signal. LEVEL BY LEVEL EVALUATION: L1-2: Annular bulging without canal stenosis or neural foraminal narrowing. L2-3: Annular bulging. Mild facet arthropathy and ligamentum flavum redundancy without canal stenosis. Mild LEFT neural foraminal narrowing. L3-4: Large 5 mm broad-based disc bulge. Moderate facet arthropathy and ligamentum flavum redundancy. Severe canal stenosis. Moderate to severe bilateral neural foraminal narrowing. L4-5: Anterolisthesis. Moderate broad-based disc bulge. Severe facet arthropathy and ligamentum flavum redundancy with enhancing RIGHT facet. Severe canal stenosis. Severe RIGHT greater than LEFT neural foraminal narrowing. Abnormal signal within the RIGHT L4-5 lateral recess extend toward the RIGHT L5-S1 neural foramen, contiguous with the RIGHT facet with superimposed enhancement. 6 mm RIGHT facet synovial cyst, less likely abscess. Trace component extensive RIGHT ligamentum flavum. Bright STIR signal within the L4-5 interspinous space. L5-S1: Small broad-based disc bulge eccentric laterally. Mild facet arthropathy and ligamentum flavum redundancy without canal stenosis. Moderate RIGHT, mild LEFT neural foraminal narrowing. IMPRESSION: MRI THORACIC SPINE: No MR findings of infection within the thoracic spine. Moderate LEFT central T7-8 disc protrusion resulting in mild canal stenosis. MRI LUMBAR SPINE: Grade 1 L4-5 anterolisthesis with severe L4-5 RIGHT facet arthropathy, inflammation versus infectious bone marrow changes and facet cyst versus abscess. Possible RIGHT psoas muscle myositis. Severe canal stenosis L3-4 and L4-5. Severe L4-5 and moderate to severe L3-4  neural foraminal narrowing. Electronically  Signed   By: Elon Alas M.D.   On: 11/11/2015 06:13   US Renal Port  11/09/2015  CLINICAL DATA:  UTI EXAM: RENAL / URINARY TRACT ULTRASOUND COMPLETE COMPARISON:  08/01/2015 FINDINGS: Right Kidney: Length: 15.5 cm. Echogenicity within normal limits. No mass or hydronephrosis visualized. Left Kidney: Length: 16.1 cm. Echogenicity within normal limits. No mass or hydronephrosis visualized. Bladder: Decompressed by Foley catheter. IMPRESSION: Normal kidneys bilaterally. Electronically Signed   By: Inez Catalina M.D.   On: 11/09/2015 11:20   Ir Removal Tun Cv Cath W/o Fl  11/04/2015  INDICATION: Osteomyelitis and MRSA bacteremia. Patient had a right IJ PICC line placed at outside facility. Patient has completed treatment and request for removal of this central venous catheter. EXAM: REMOVAL TUNNELED CENTRAL VENOUS CATHETER MEDICATIONS: None; ANESTHESIA/SEDATION: Moderate Sedation Time: None The patient's level of consciousness and vital signs were monitored continuously by radiology nursing throughout the procedure under my direct supervision. FLUOROSCOPY TIME:  Fluoroscopy Time: None COMPLICATIONS: None immediate. PROCEDURE: Informed written consent was obtained from the patient after a thorough discussion of the procedural risks, benefits and alternatives. All questions were addressed. Maximal Sterile Barrier Technique was utilized including caps, mask, sterile gowns, sterile gloves, sterile drape, hand hygiene and skin antiseptic. A timeout was performed prior to the initiation of the procedure. The patient's right chest and catheter was prepped and draped in a normal sterile fashion. Using gentle blunt dissection the cuff of the catheter was exposed and the catheter was removed in it's entirety. Pressure was held till hemostasis was obtained. A sterile dressing was applied. The patient tolerated the procedure well with no immediate complications. IMPRESSION:  Successful catheter removal as described above. Read by: Ascencion Dike [name] Electronically Signed   By: Corrie Mckusick D.O.   On: 11/04/2015 11:31   Dg Chest Port 1 View  11/26/2015  CLINICAL DATA:  55 year old male with a history of reason surgery. Osteomyelitis discitis C5-C6 with corpectomy C5. EXAM: PORTABLE CHEST 1 VIEW COMPARISON:  11/12/2015 FINDINGS: Cardiomediastinal silhouette unchanged. Right upper extremity PICC has been placed. Since the prior chest x-ray there has been extubation and removal of gastric tube. Low lung volumes with interstitial opacities. No confluent airspace disease or large pleural effusion. IMPRESSION: Low lung volumes with interstitial opacities, most likely atelectasis. No large pleural effusion or evidence of lobar pneumonia. Right upper extremity PICC. Since the prior plain film there has been removal of endotracheal tube and gastric tube. Signed, Dulcy Fanny. Earleen Newport, DO [facility] Electronically Signed   By: Corrie Mckusick D.O.   On: 11/26/2015 10:52   Dg Chest Port 1 View  11/12/2015  CLINICAL DATA:  Endotracheal tube placement. Acute respiratory failure with hypoxemia. EXAM: PORTABLE CHEST 1 VIEW COMPARISON:  Nov 10, 2015. FINDINGS: Endotracheal and nasogastric tubes are unchanged in position. Left internal jugular catheter is unchanged in position. No pneumothorax is noted. Increased bilateral perihilar and basilar opacities are noted concerning for worsening edema or atelectasis. Bony thorax is unremarkable. IMPRESSION: Increased bilateral perihilar and basilar opacities concerning for worsening edema or atelectasis. Stable support apparatus. Electronically Signed   By: Marijo Conception, M.D.   On: 11/12/2015 13:59   Dg Chest Port 1 View  11/10/2015  CLINICAL DATA:  Respiratory failure. EXAM: PORTABLE CHEST 1 VIEW COMPARISON:  11/09/2015 . FINDINGS: Endotracheal tube, left IJ line, NG tube in stable position.  Stable cardiomegaly . Low lung volumes with mild bibasilar atelectasis. No pleural effusion or pneumothorax. IMPRESSION:  1. Lines and tubes in stable position. 2. Low lung volumes with mild bibasilar atelectasis again noted. 3. Stable cardiomegaly. Electronically Signed   By: Marcello Moores  Register   On: 11/10/2015 07:06   Dg Chest Port 1 View  11/09/2015  CLINICAL DATA:  Intubation.  Shortness of breath. EXAM: PORTABLE CHEST 1 VIEW COMPARISON:  11/08/2015. FINDINGS: Endotracheal tube, left IJ line, NG tube in stable position. Mild cardiomegaly, no pulmonary venous congestion. Low lung volumes with mild bibasilar atelectasis. No pleural effusion or pneumothorax. IMPRESSION: 1. Lines and tubes in stable position. 2. Low lung volumes with mild bibasilar atelectasis. 3. Mild cardiomegaly.  No pulmonary venous congestion. Electronically Signed   By: Marcello Moores  Register   On: 11/09/2015 07:09   Dg Chest Port 1 View  11/08/2015  CLINICAL DATA:  Respiratory failure.  Shortness of breath. EXAM: PORTABLE CHEST 1 VIEW COMPARISON:  11/07/2015. FINDINGS: Endotracheal tube, left IJ line, NG tube in stable position. Heart size normal. Low lung volumes with bibasilar atelectasis and/or infiltrates. No pleural effusion or pneumothorax. IMPRESSION: 1. Lines and tubes in stable position. 2.  Low lung volumes with bibasilar atelectasis and/or infiltrates. Electronically Signed   By: Marcello Moores  Register   On: 11/08/2015 07:05   Dg Chest Port 1 View  11/07/2015  CLINICAL DATA:  Central line placement EXAM: PORTABLE CHEST 1 VIEW COMPARISON:  11/07/2015 FINDINGS: Stable endotracheal tube and nasogastric tube. Left internal jugular line has been placed with tip over the superior vena cava. No pneumothorax identified. Stable mild cardiac enlargement. Lungs clear. IMPRESSION: Central line as described with no pneumothorax visible. Electronically Signed   By: Skipper Cliche M.D.   On: 11/07/2015 12:20   Dg Chest Port 1 View  11/07/2015   CLINICAL DATA:  Altered mental status, post intubation. EXAM: PORTABLE CHEST 1 VIEW COMPARISON:  Earlier this day at 0146 hour FINDINGS: Endotracheal tube 3.5 cm from the carina. Tip of the enteric tube below the diaphragm not included in the field of view. Low lung volumes persist. Left basilar atelectasis is unchanged. Cardiomediastinal contours are unchanged allowing for differences in technique. No new airspace disease. IMPRESSION: Endotracheal tube 3.5 cm from the carina.  Enteric tube in place. Persistent low lung volumes and left basilar atelectasis. Electronically Signed   By: Jeb Levering M.D.   On: 11/07/2015 05:09   Dg Chest Port 1 View  11/07/2015  CLINICAL DATA:  Hypoxia.  Patient not responsive. EXAM: PORTABLE CHEST 1 VIEW COMPARISON:  Most recent radiographs 08/08/2015 FINDINGS: Low lung volumes and supine positioning limits assessment. Borderline mild cardiomegaly. No confluent airspace disease. Probable left basilar atelectasis. No large pleural effusion or pneumothorax. IMPRESSION: Hypoventilatory chest with left basilar atelectasis. Evaluation limited by portable supine technique and low lung volumes. Recommend repeat PA and lateral exam when patient is able. Electronically Signed   By: Jeb Levering M.D.   On: 11/07/2015 02:52   Dg Abd Portable 1v  11/10/2015  CLINICAL DATA:  Orogastric tube placement EXAM: PORTABLE ABDOMEN - 1 VIEW COMPARISON:  None. FINDINGS: The there is normal small bowel gas pattern. There is NG tube coiled within proximal stomach with tip in gastric fundus. Moderate gas and stool noted within transverse colon and proximal left colon. IMPRESSION: NG tube coiled within proximal stomach with tip in gastric fundus. Moderate stool and gas noted in visualized colon. Electronically Signed   By: Lahoma Crocker M.D.   On: 11/10/2015 11:28   Dg Foot 2 Views Right  11/11/2015  CLINICAL DATA:  Wound on the tip of the right great toe. EXAM: RIGHT FOOT - 2 VIEW COMPARISON:   None. FINDINGS: There is no evidence of fracture or dislocation. There is no evidence of arthropathy or other focal bone abnormality. Soft tissue ulcer at the tip of the first distal phalanx. IMPRESSION: Soft tissue ulcer at the tip of the first distal phalanx. No evidence of osteomyelitis of the right foot. Electronically Signed   By: Kathreen Devoid   On: 11/11/2015 19:38   Dg C-arm 1-60 Min  11/25/2015  CLINICAL DATA:  C5-C6 corpectomy. EXAM: DG C-ARM 61-120 MIN COMPARISON:  None. FINDINGS: C5 and C6 have been resected with surgical hardware in place of the previous vertebral bodies. Anterior plate and screws span from C4 through C7. Hardware is in good position on this single lateral view. IMPRESSION: Status post C5-6 corpectomy. Electronically Signed   By: Dorise Bullion III M.D   On: 11/25/2015 20:35   Dg C-arm 1-60 Min-no Report  11/29/2015  CLINICAL DATA: for posterior cervical fusion C-ARM 1-60 MINUTES Fluoroscopy was utilized by the requesting physician.  No radiographic interpretation.    Microbiology: No results found for this or any previous visit (from the past 240 hour(s)).   Labs: Basic Metabolic Panel:  Recent Labs Lab 11/27/15 0200 11/28/15 0439 11/29/15 0500 12/01/15 0427 12/02/15 0455  NA 131* 131* 137 136 137  K 4.1 3.8 3.7 3.5 3.7  CL 93* 93* 100* 100* 102  CO2 _0 GLUCOSE 137* 179* 110* 150* 146*  BUN _1 CREATININE 1.27* 1.03 0.86 0.80 0.77  CALCIUM 9.3 9.3 9.7 9.5 9.6  MG  --   --   --   --  1.6*  PHOS  --  4.1  --   --   --    Liver Function Tests:  Recent Labs Lab 11/27/15 0200 11/28/15 0439  AST 12*  --   ALT 12*  --   ALKPHOS 101  --   BILITOT 0.4  --   PROT 7.1  --   ALBUMIN 2.6* 2.4*   No results for input(s): LIPASE, AMYLASE in the last 168 hours. No results for input(s): AMMONIA in the last 168 hours. CBC:  Recent Labs Lab 11/25/15 1653 11/26/15 0459 11/27/15 0200 11/29/15 0500 12/01/15 0427  WBC  --  10.1  12.4* 7.2 9.3  HGB 10.2* 9.1* 9.4* 8.3* 8.3*  HCT 30.0* 30.7* 30.2* 27.6* 27.3*  MCV  --  86.2 84.4 85.4 83.5  PLT  --  214 229 238 290   Cardiac Enzymes: No results for input(s): CKTOTAL, CKMB, CKMBINDEX, TROPONINI in the last 168 hours. BNP: BNP (last 3 results)  Recent Labs  11/07/15 0729  BNP 75.6    ProBNP (last 3 results) No results for input(s): PROBNP in the last 8760 hours.  CBG:  Recent Labs Lab 12/01/15 1126 12/01/15 1652 12/01/15 2130 12/02/15 0605 12/02/15 1131  GLUCAP 144* 116* 161* 167* 134*       Signed:  Kandance Yano MD, PhD  [facility] 12/02/2015, 11:56 AM

## 2015-12-05 ENCOUNTER — Non-Acute Institutional Stay (SKILLED_NURSING_FACILITY): Payer: Medicaid Other | Admitting: Adult Health

## 2015-12-05 ENCOUNTER — Encounter: Payer: Self-pay | Admitting: Adult Health

## 2015-12-05 DIAGNOSIS — M4802 Spinal stenosis, cervical region: Secondary | ICD-10-CM

## 2015-12-05 DIAGNOSIS — J449 Chronic obstructive pulmonary disease, unspecified: Secondary | ICD-10-CM | POA: Diagnosis not present

## 2015-12-05 DIAGNOSIS — M4622 Osteomyelitis of vertebra, cervical region: Secondary | ICD-10-CM | POA: Diagnosis not present

## 2015-12-05 DIAGNOSIS — E1149 Type 2 diabetes mellitus with other diabetic neurological complication: Secondary | ICD-10-CM | POA: Diagnosis not present

## 2015-12-05 DIAGNOSIS — R339 Retention of urine, unspecified: Secondary | ICD-10-CM

## 2015-12-05 DIAGNOSIS — A4102 Sepsis due to Methicillin resistant Staphylococcus aureus: Secondary | ICD-10-CM

## 2015-12-05 DIAGNOSIS — I5032 Chronic diastolic (congestive) heart failure: Secondary | ICD-10-CM

## 2015-12-05 DIAGNOSIS — I110 Hypertensive heart disease with heart failure: Secondary | ICD-10-CM

## 2015-12-05 NOTE — Progress Notes (Signed)
Location:   [facility] Room Number: 156 b Place of Service:  SNF ([id number])   CODE STATUS: full code   No Known Allergies  Chief Complaint  Patient presents with  . Hospitalization Follow-up    HPI:  He has been hospitalized for mrsa sepsis; abscess lumbar spine; cervical discitis; hypoxic respiratory failure and diastolic heart failure. He is here for short term rehab with his goal to return back home. He is not voicing any complaints this morning.  He is not wearing his cervical collar as ordered we did discuss the importance of him wearing the collar; he told me that he would wear it when he got out of bed. There are no nursing concerns at this time.   Past Medical History  Diagnosis Date  . Diabetes mellitus without complication ([place])   . Hypertension   . DDD (degenerative disc disease), lumbar   . Osteomyelitis ([place])   . Urinary retention   . COPD (chronic obstructive pulmonary disease) ([place])   . Osteomyelitis ([place])   . MRSA bacteremia   . Chronic back pain   . Anemia   . Acute respiratory failure ([place])   . Urinary retention     Past Surgical History  Procedure Laterality Date  . Anterior cervical corpectomy N/A 11/25/2015    Procedure: ANTERIOR CERVICAL FIVE CORPECTOMY Cervical four - six fusion;  Surgeon: Consuella Lose, MD;  Location: [facility];  Service: Neurosurgery;  Laterality: N/A;  ANTERIOR CERVICAL FIVE CORPECTOMY Cervical four - six fusion  . Posterior cervical fusion/foraminotomy N/A 11/29/2015    Procedure: Cervical Three-Cervical Seven Posterior Cervical Laminectomy with Fusion;  Surgeon: Consuella Lose, MD;  Location: [facility];  Service: Neurosurgery;  Laterality: N/A;  Cervical Three-Cervical Seven Posterior Cervical Laminectomy with Fusion    Social History   Social History  . Marital Status: Single    Spouse Name: N/A  . Number of Children: N/A  . Years of Education: N/A   Occupational History  . Not on file.   Social  History Main Topics  . Smoking status: Former Smoker -- 3.00 packs/day    Types: Cigarettes  . Smokeless tobacco: Never Used  . Alcohol Use: No  . Drug Use: No  . Sexual Activity: Not on file   Other Topics Concern  . Not on file   Social History Narrative   Family History  Problem Relation Age of Onset  . Cancer Other   . Diabetes Other       VITAL SIGNS BP 139/90 mmHg  Pulse 74  Temp(Src) 98 F (36.7 C)  Resp 18  Ht 6' 1"  (1.854 m)  Wt 197 lb (89.359 kg)  BMI 26.00 kg/m2  SpO2 95%  Patient's Medications  New Prescriptions   No medications on file  Previous Medications   ACETAMINOPHEN (TYLENOL) 325 MG TABLET    Take 650 mg by mouth every 6 (six) hours as needed for mild pain.   ALBUTEROL (PROVENTIL HFA;VENTOLIN HFA) 108 (90 BASE) MCG/ACT INHALER    Inhale 2 puffs into the lungs every 6 (six) hours as needed for wheezing or shortness of breath.   AMINO ACIDS-PROTEIN HYDROLYS (FEEDING SUPPLEMENT, PRO-STAT SUGAR FREE 64,) LIQD    Take 30 mLs by mouth 3 (three) times daily with meals.   BISACODYL (DULCOLAX) 10 MG SUPPOSITORY    Place 1 suppository (10 mg total) rectally daily as needed for moderate constipation.   COLLAGENASE (SANTYL) OINTMENT    Apply topically daily.  DIAZEPAM (VALIUM) 5 MG TABLET    Take 1 tablet (5 mg total) by mouth every 8 (eight) hours as needed for anxiety or muscle spasms.   FEEDING SUPPLEMENT (BOOST / RESOURCE BREEZE) LIQD    Take 1 Container by mouth 3 (three) times daily between meals.   FUROSEMIDE (LASIX) 40 MG TABLET    Take 40 mg by mouth 2 (two) times daily.    GLIMEPIRIDE (AMARYL) 2 MG TABLET    Take 2 mg by mouth daily with breakfast.   GLUCAGON, HUMAN RECOMBINANT, (GLUCAGEN) 1 MG INJECTION    Inject 1 mg into the vein once as needed for low blood sugar.   INSULIN GLARGINE (LANTUS) 100 UNIT/ML INJECTION    Inject 0.1 mLs (10 Units total) into the skin at bedtime.   INSULIN LISPRO (HUMALOG) 100 UNIT/ML INJECTION    Before each meal 3  times a day, 140-199 - 2 units, 200-250 - 4 units, 251-299 - 6 units,  300-349 - 8 units,  350 or above 10 units. Insulin PEN if approved, provide syringes and needles if needed.   LISINOPRIL (PRINIVIL,ZESTRIL) 10 MG TABLET    Take 10 mg by mouth daily.   MAGNESIUM OXIDE (MAG-OX) 400 MG TABLET    Take 400 mg by mouth 2 (two) times daily.   METFORMIN (GLUCOPHAGE) 500 MG TABLET    Take 1 tablet (500 mg total) by mouth 2 (two) times daily with a meal.   METOPROLOL SUCCINATE (TOPROL-XL) 50 MG 24 HR TABLET    Take 50 mg by mouth daily. Take with or immediately following a meal.   MULTIPLE VITAMINS-MINERALS (DECUBI-VITE PO)    Take 1 tablet by mouth daily.   OXYCODONE (OXYCONTIN) 40 MG 12 HR TABLET    Take 1 tablet (40 mg total) by mouth every 12 (twelve) hours.   OXYCODONE-ACETAMINOPHEN (ROXICET) 5-325 MG TABLET    Take 1 tablet by mouth every 8 (eight) hours as needed for moderate pain or severe pain.   PAROXETINE (PAXIL) 40 MG TABLET    Take 40 mg by mouth every morning.   POLYETHYLENE GLYCOL (MIRALAX / GLYCOLAX) PACKET    Take 17 g by mouth daily. Hold if diarrhea   POTASSIUM CHLORIDE (K-DUR) 10 MEQ TABLET    Take 2 tablets (20 mEq total) by mouth daily.   PREGABALIN (LYRICA) 75 MG CAPSULE    Take 1 capsule (75 mg total) by mouth every 8 (eight) hours.   SENNA-DOCUSATE (SENOKOT-S) 8.6-50 MG TABLET    Take 2 tablets by mouth at bedtime.   SODIUM CHLORIDE (OCEAN) 0.65 % SOLN NASAL SPRAY    Place 1 spray into both nostrils as needed for congestion.   TAMSULOSIN (FLOMAX) 0.4 MG CAPS CAPSULE    Take 0.4 mg by mouth daily.    TIZANIDINE (ZANAFLEX) 2 MG CAPSULE    Take 1 capsule (2 mg total) by mouth 3 (three) times daily as needed for muscle spasms.   TRAMADOL (ULTRAM) 50 MG TABLET    Take 1 tablet (50 mg total) by mouth every 6 (six) hours as needed for moderate pain.   VANCOMYCIN IN SODIUM CHLORIDE 0.9 % 500 ML    Will need to continue iv vanc per pharmacy  through July 15th per SNF protocol Trough goal  of 15-20 Twice weekly bmp, vancomycin trough  repeat ESR  in 2 weeks Report result to infectious disease   VICTOZA 18 MG/3ML SOPN    Inject 1.2 mg into the skin 2 (two) times daily.  Modified Medications   No medications on file  Discontinued Medications   AMINO ACIDS-PROTEIN HYDROLYS (FEEDING SUPPLEMENT, PRO-STAT SUGAR FREE 64,) LIQD    Take 30 mLs by mouth 2 (two) times daily.     SIGNIFICANT DIAGNOSTIC EXAMS  11-07-15: EEG: This EEG is indicative of sedated sleep.  No focal or epileptiform activity is seen  11-07-15: ct of head: 1. No acute intracranial pathology seen on CT. 2. Mild small vessel ischemic microangiopathy.   11-09-15: renal ultrasound: Normal kidneys bilaterally  11-10-15: ct of abdomen and pelvis: 1. No abscess identified in the abdomen or pelvis. 2. Inflammatory changes in the fat adjacent to the urinary bladder, compatible with the reported clinical history of urinary tract infection. 3. Probable vascular calcification versus tiny 2 mm nonobstructive calculus in the upper pole of the right kidney. 4. Normal appendix. 5. Splenomegaly. 6. Areas of atelectasis or resolving airspace consolidation in the lung bases bilaterally with trace right pleural effusion. 7. Atherosclerosis.  11-11-15: mri of thoracic and lumbar spine:  MRI THORACIC SPINE: No MR findings of infection within the thoracic spine. Moderate LEFT central T7-8 disc protrusion resulting in mild canal stenosis.   MRI LUMBAR SPINE: Grade 1 L4-5 anterolisthesis with severe L4-5 RIGHT facet arthropathy, inflammation versus infectious bone marrow changes and facet cyst versus abscess. Possible RIGHT psoas muscle myositis. Severe canal stenosis L3-4 and L4-5. Severe L4-5 and moderate to severe L3-4 neural foraminal narrowing.  11-11-15: mri of cervical spine: C5-6 discitis osteomyelitis with trace epidural abscess at this level. Associated paraspinal muscle edema/myositis. Severe canal stenosis at C5-6.  C4-5 through C6-7 spinal cord edema/pre syrinx. No abnormal enhancement of the spinal cord. Moderate canal stenosis at C6-7, mild at C3-4 and C4-5. Multilevel neural foraminal narrowing: Severe at C5-6 and C6-7.  11-20-15: 2-d echo; - Left ventricle: The cavity size was normal. There was mild concentric hypertrophy. Systolic function was normal. The estimated ejection fraction was in the range of 55% to 60%. Doppler parameters are consistent with abnormal left ventricular relaxation (grade 1 diastolic dysfunction). - Pericardium, extracardiac: A small pericardial effusion was identified anterior to the heart. There was no evidence of hemodynamic compromise.   11-26-15: chest x-ray: Low lung volumes with interstitial opacities, most likely atelectasis. No large pleural effusion or evidence of lobar pneumonia. Right upper extremity PICC. Since the prior plain film there has been removal of endotracheal tube and gastric tube.  11-28-15: cervical spine x-ray: Postoperative changes of C5 corpectomy with anterior plate and screw fixation of C4-C6. Note that C6 and below not well visualized secondary to overlying soft tissues. Compared to the intraoperative fluoroscopic image there is mild increased kyphosis. Significant prevertebral soft tissue swelling and gas, favored to be postoperative changes.    LABS REVIEWED:   11-29-15: wbc 7.2; hgb 8.3; hct 27.6; mcv 85.4; plt 238; glucose 110; bun 12; creat 0.86; k+ 3.7; na++ 137 12-01-15: wbc 9.3; hgb 8.3; hct 27.3 mcv 83.5; plt 290; glucose 150; bun 9; creat 0.80; k+ 3.5; na++ 136; sed rate 132   Review of Systems  Constitutional: Negative for malaise/fatigue.  Respiratory: Negative for cough and shortness of breath.   Cardiovascular: Negative for chest pain, palpitations and leg swelling.  Gastrointestinal: Negative for heartburn, abdominal pain and constipation.  Musculoskeletal: Negative for myalgias, back pain and joint pain.  Skin: Negative.     Neurological: Negative for dizziness.  Psychiatric/Behavioral: The patient is not nervous/anxious.     Physical Exam  Constitutional: He is oriented to person, place,  and time. No distress.  Overweight   Eyes: Conjunctivae are normal.  Neck: Neck supple. No JVD present. No thyromegaly present.  Cardiovascular: Normal rate, regular rhythm and intact distal pulses.   Respiratory: Effort normal and breath sounds normal. No respiratory distress. He has no wheezes.  GI: Soft. Bowel sounds are normal. He exhibits no distension. There is no tenderness.  Musculoskeletal: He exhibits no edema.  Able to move all extremities  Has neck collar in place  Lymphadenopathy:    He has no cervical adenopathy.  Neurological: He is alert and oriented to person, place, and time.  Skin: Skin is warm and dry. He is not diaphoretic.  Incision line without signs of infection present picc line in place   Psychiatric: He has a normal mood and affect.       ASSESSMENT/ PLAN:  1. Diabetes: will continue victoza 1.2 mg twice daily; lantus 10 units daily; amaryl 2 mg daily; metformin 500 mg twice daily humalog SSI: 140-199 = 2u; 200-250 =4; 251-299 = 6u; 300-349 = 8u;  >=350 = 10u  2.  Chronic diastolic heart failure: will continue lasix 40 mg twice daily   3. Hypertension: will continue lisinopril 10 mg daily; toprol xl 50 mg daily   4. COPD: will continue albuterol 2 puffs every 6 hours as needed  5. Constipation: will continue miralax daily; senna s 2 tabs daily   6. Urinary retention: will continue flomax 0.4 mg daily   7. 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 Severe cervical spinal stenosis: has chronic pain: will continue oxycontin 40 mg every 12 hours; percocet 5/325 mg every 8 hours as needed; will continue lyrica 75 mg three times daily and has valuim 5 mg every 8 hours as needed for spasms; has zanaflex 2 mg every 8  hours as needed also has ultram 50 mg every 6 hours as needed     Time spent with patient  50   minutes >50% time spent counseling; reviewing medical record; tests; labs; and developing future plan of care    Ok Edwards NP [facility]  Contact [phone] Monday through Friday 8am- 5pm  After hours call [phone]

## 2015-12-06 ENCOUNTER — Encounter: Payer: Self-pay | Admitting: Internal Medicine

## 2015-12-06 ENCOUNTER — Non-Acute Institutional Stay (SKILLED_NURSING_FACILITY): Payer: Medicaid Other | Admitting: Internal Medicine

## 2015-12-06 DIAGNOSIS — D6489 Other specified anemias: Secondary | ICD-10-CM | POA: Diagnosis not present

## 2015-12-06 DIAGNOSIS — M4622 Osteomyelitis of vertebra, cervical region: Secondary | ICD-10-CM

## 2015-12-06 DIAGNOSIS — M21371 Foot drop, right foot: Secondary | ICD-10-CM

## 2015-12-06 DIAGNOSIS — E785 Hyperlipidemia, unspecified: Secondary | ICD-10-CM

## 2015-12-06 DIAGNOSIS — I10 Essential (primary) hypertension: Secondary | ICD-10-CM | POA: Diagnosis not present

## 2015-12-06 DIAGNOSIS — I5032 Chronic diastolic (congestive) heart failure: Secondary | ICD-10-CM | POA: Diagnosis not present

## 2015-12-06 DIAGNOSIS — M21372 Foot drop, left foot: Secondary | ICD-10-CM

## 2015-12-06 DIAGNOSIS — E1165 Type 2 diabetes mellitus with hyperglycemia: Secondary | ICD-10-CM

## 2015-12-06 DIAGNOSIS — Z794 Long term (current) use of insulin: Secondary | ICD-10-CM

## 2015-12-06 DIAGNOSIS — J15212 Pneumonia due to Methicillin resistant Staphylococcus aureus: Secondary | ICD-10-CM | POA: Diagnosis not present

## 2015-12-06 DIAGNOSIS — R339 Retention of urine, unspecified: Secondary | ICD-10-CM | POA: Diagnosis not present

## 2015-12-06 DIAGNOSIS — J449 Chronic obstructive pulmonary disease, unspecified: Secondary | ICD-10-CM | POA: Diagnosis not present

## 2015-12-06 DIAGNOSIS — E118 Type 2 diabetes mellitus with unspecified complications: Secondary | ICD-10-CM

## 2015-12-06 DIAGNOSIS — M4642 Discitis, unspecified, cervical region: Secondary | ICD-10-CM

## 2015-12-06 DIAGNOSIS — IMO0002 Reserved for concepts with insufficient information to code with codable children: Secondary | ICD-10-CM

## 2015-12-06 NOTE — Progress Notes (Signed)
HISTORY AND PHYSICAL   DATE: 12/06/15  Location:    [facility] Room Number: 156 B Place of Service: SNF (31)   Extended Emergency Contact Information Primary Emergency Contact: Sherron Ales [place] Mobile Phone: [phone] Relation: Daughter Secondary Emergency Contact: Bero,Megan Address: [address]          [place] [address] [place] Phone: [phone] Relation: Daughter  Advanced Directive information Does patient have an advance directive?: No, Would patient like information on creating an advanced directive?: No - patient declined information FULL CODE Chief Complaint  Patient presents with  . New Admit To SNF    HPI:  55 yo male seen today as a new admission into SNF following hospital stay for septic shock, change in mental status, pressure ulcers, acute respiratory failure with hypoxia due to CAP, UTI, AKI, osteomyelitis of cervical and lumbar spine, HTN, diastolic HF, DM. He presented to the ED with change in MS. He was tx with abx at the end of May 2017 for osteomyelitis of spine. In Feb and Mar of 2017, he was tx with IV vanco (has also been tx with dapto and linezolid) by outside hospital for lumbar/left shoulder/left clavical osteo and multiple ulcers in right forefoot/osteo lateral sesamoid bone, possible C5 osteo also. UA (+) with cx growing E coli which was tx fully prior to d/c. MRI C spine s/o ongoing C spine destruction. ID followed pt. neurosx performed decompression of C spine in 2 stages (6/9th and 6/13th). Sputum cx (+) MRSA. He was placed on IV vanco and will need -->7/15th with goal trough of 15-20. H/H 8.3/27.3; WBC 9.3K; Cr 1.27-->0.77;  Albumin 2.4; A1c 7.2%  Today he has no concerns. Neck is uncomfortable but pain controlled. No CP/SOB. Uses straw to drink. Has neck collar. no nursing issues. No falls. No f/c. Wound care following surgical site. He gets nutritional supplements  DM -  borderline controlled. Takes victoza 1.2 mg twice daily; lantus 10 units daily; amaryl 2 mg daily; metformin 500 mg twice daily; humalog SSI  Chronic diastolic heart failure - stable on lasix 40 mg twice daily with Kdur  Hypertension - stable on lisinopril 10 mg daily; toprol xl 50 mg daily   COPD - stable on albuterol 2 puffs every 6 hours as needed  Constipation - stable on miralax daily; senna s 2 tabs daily   Urinary retention - stable on flomax 0.4 mg daily. He has a foley cath for comfort  severe cervical spinal stenosis - s/p decompression sx. He has chronic pain and takes oxycontin 40 mg every 12 hours; percocet 5/325 mg every 8 hours as needed; lyrica 75 mg three times daily; valuim 5 mg every 8 hours as needed for spasms; zanaflex 2 mg every 8 hours as needed; ultram 50 mg every 6 hours as needed   Anemia of chronic disease - stable; Hgb 8.3 at d/c  Hyperlipidemia - he does not take any meds at the present time. No recent lipid panel in system  Mood d/o - stable on paroxetine   Past Medical History  Diagnosis Date  . Diabetes mellitus without complication ([place])   . Hypertension   . DDD (degenerative disc disease), lumbar   . Osteomyelitis ([place])   . Urinary retention   . COPD (chronic obstructive pulmonary disease) ([place])   . Osteomyelitis ([place])   . MRSA bacteremia   . Chronic back pain   . Anemia   .  Acute respiratory failure ([place])   . Urinary retention     Past Surgical History  Procedure Laterality Date  . Anterior cervical corpectomy N/A 11/25/2015    Procedure: ANTERIOR CERVICAL FIVE CORPECTOMY Cervical four - six fusion;  Surgeon: Consuella Lose, MD;  Location: [facility];  Service: Neurosurgery;  Laterality: N/A;  ANTERIOR CERVICAL FIVE CORPECTOMY Cervical four - six fusion  . Posterior cervical fusion/foraminotomy N/A 11/29/2015    Procedure: Cervical Three-Cervical Seven Posterior Cervical Laminectomy with Fusion;  Surgeon: Consuella Lose, MD;   Location: [facility];  Service: Neurosurgery;  Laterality: N/A;  Cervical Three-Cervical Seven Posterior Cervical Laminectomy with Fusion    Patient Care Team: Tamsen Roers, MD as PCP - General (Family Medicine)  Social History   Social History  . Marital Status: Single    Spouse Name: N/A  . Number of Children: N/A  . Years of Education: N/A   Occupational History  . Not on file.   Social History Main Topics  . Smoking status: Former Smoker -- 3.00 packs/day    Types: Cigarettes  . Smokeless tobacco: Never Used  . Alcohol Use: No  . Drug Use: No  . Sexual Activity: Not on file   Other Topics Concern  . Not on file   Social History Narrative     reports that he has quit smoking. His smoking use included Cigarettes. He smoked 3.00 packs per day. He has never used smokeless tobacco. He reports that he does not drink alcohol or use illicit drugs.  Family History  Problem Relation Age of Onset  . Cancer Other   . Diabetes Other    No family status information on file.    Immunization History  Administered Date(s) Administered  . Pneumococcal Polysaccharide-23 04/14/2012    No Known Allergies  Medications: Patient's Medications  New Prescriptions   No medications on file  Previous Medications   ALBUTEROL (PROVENTIL HFA;VENTOLIN HFA) 108 (90 BASE) MCG/ACT INHALER    Inhale 2 puffs into the lungs every 6 (six) hours as needed for wheezing or shortness of breath.   ALUM & MAG HYDROXIDE-SIMETH (MAALOX PLUS) 400-400-40 MG/5ML SUSPENSION    Take 30 mLs by mouth every 6 (six) hours as needed for indigestion.   AMINO ACIDS-PROTEIN HYDROLYS (FEEDING SUPPLEMENT, PRO-STAT SUGAR FREE 64,) LIQD    Take 30 mLs by mouth 3 (three) times daily with meals.   BISACODYL (DULCOLAX) 10 MG SUPPOSITORY    Place 1 suppository (10 mg total) rectally daily as needed for moderate constipation.   COLLAGENASE (SANTYL) OINTMENT    Apply topically daily.   DIAZEPAM (VALIUM) 5 MG TABLET    Take  1 tablet (5 mg total) by mouth every 8 (eight) hours as needed for anxiety or muscle spasms.   FEEDING SUPPLEMENT (BOOST / RESOURCE BREEZE) LIQD    Take 1 Container by mouth 3 (three) times daily between meals.   FUROSEMIDE (LASIX) 40 MG TABLET    Take 40 mg by mouth 2 (two) times daily.    GLIMEPIRIDE (AMARYL) 2 MG TABLET    Take 2 mg by mouth daily with breakfast.   GLUCAGON, HUMAN RECOMBINANT, (GLUCAGEN) 1 MG INJECTION    Inject 1 mg into the vein once as needed for low blood sugar.   HEPARIN FLUSH 10 UNIT/ML SOLN INJECTION    Inject 5 mLs into the vein 2 (two) times daily.   INSULIN GLARGINE (LANTUS) 100 UNIT/ML INJECTION    Inject 0.1 mLs (10 Units total) into the  skin at bedtime.   INSULIN LISPRO (HUMALOG) 100 UNIT/ML INJECTION    Inject 0-5 Units into the skin 3 (three) times daily before meals. If 0-149=0 units, 150-600=5 units   LISINOPRIL (PRINIVIL,ZESTRIL) 10 MG TABLET    Take 10 mg by mouth daily.   MAGNESIUM OXIDE (MAG-OX) 400 MG TABLET    Take 400 mg by mouth 2 (two) times daily.   METFORMIN (GLUCOPHAGE) 500 MG TABLET    Take 1 tablet (500 mg total) by mouth 2 (two) times daily with a meal.   METOPROLOL SUCCINATE (TOPROL-XL) 50 MG 24 HR TABLET    Take 50 mg by mouth daily. Take with or immediately following a meal.   MULTIPLE VITAMINS-MINERALS (DECUBI-VITE PO)    Take 1 tablet by mouth daily.   OXYCODONE (OXYCONTIN) 40 MG 12 HR TABLET    Take 1 tablet (40 mg total) by mouth every 12 (twelve) hours.   OXYCODONE-ACETAMINOPHEN (ROXICET) 5-325 MG TABLET    Take 1 tablet by mouth every 8 (eight) hours as needed for moderate pain or severe pain.   PAROXETINE (PAXIL) 40 MG TABLET    Take 40 mg by mouth every morning.   POLYETHYLENE GLYCOL (MIRALAX / GLYCOLAX) PACKET    Take 17 g by mouth daily. Hold if diarrhea   POTASSIUM CHLORIDE (K-DUR) 10 MEQ TABLET    Take 2 tablets (20 mEq total) by mouth daily.   PREGABALIN (LYRICA) 75 MG CAPSULE    Take 1 capsule (75 mg total) by mouth every 8  (eight) hours.   PROMETHAZINE (PHENERGAN) 25 MG TABLET    Take 25 mg by mouth every 6 (six) hours as needed for nausea or vomiting.   SENNA-DOCUSATE (SENOKOT-S) 8.6-50 MG TABLET    Take 2 tablets by mouth at bedtime.   SODIUM CHLORIDE (OCEAN) 0.65 % SOLN NASAL SPRAY    Place 1 spray into both nostrils as needed for congestion.   SODIUM CHLORIDE FLUSH (NORMAL SALINE FLUSH) 0.9 % SOLN    Inject 10 mLs into the vein 2 (two) times daily. Flush before and after antibiotic   TAMSULOSIN (FLOMAX) 0.4 MG CAPS CAPSULE    Take 0.4 mg by mouth daily.    TIZANIDINE (ZANAFLEX) 2 MG CAPSULE    Take 1 capsule (2 mg total) by mouth 3 (three) times daily as needed for muscle spasms.   TRAMADOL (ULTRAM) 50 MG TABLET    Take 1 tablet (50 mg total) by mouth every 6 (six) hours as needed for moderate pain.   VANCOMYCIN IN SODIUM CHLORIDE 0.9 % 500 ML    Will need to continue iv vanc per pharmacy  through July 15th per SNF protocol Trough goal of 15-20 Twice weekly bmp, vancomycin trough  repeat ESR  in 2 weeks Report result to infectious disease   VICTOZA 18 MG/3ML SOPN    Inject 1.2 mg into the skin 2 (two) times daily.   Modified Medications   No medications on file  Discontinued Medications   ACETAMINOPHEN (TYLENOL) 325 MG TABLET    Take 650 mg by mouth every 6 (six) hours as needed for mild pain. Reported on 12/06/2015   INSULIN LISPRO (HUMALOG) 100 UNIT/ML INJECTION    Before each meal 3 times a day, 140-199 - 2 units, 200-250 - 4 units, 251-299 - 6 units,  300-349 - 8 units,  350 or above 10 units. Insulin PEN if approved, provide syringes and needles if needed.    Review of Systems  Musculoskeletal: Positive for arthralgias  and neck pain.  Skin: Positive for wound.  All other systems reviewed and are negative.   Filed Vitals:   12/06/15 0952  BP: 89/63  Pulse: 86  Temp: 97.1 F (36.2 C)  TempSrc: Oral  Resp: 17  Height: 6' (1.829 m)  Weight: 180 lb (81.647 kg)  SpO2: 96%   Body mass index is  24.41 kg/(m^2).  Physical Exam  Constitutional: He is oriented to person, place, and time. He appears well-developed.  Lying in bed in NAD. Hard Neck collar intact, frail appearing  HENT:  MM dry  Eyes: Pupils are equal, round, and reactive to light. No scleral icterus.  Neck: Neck supple. Carotid bruit is not present.  Hard neck collar intact  Cardiovascular: Normal rate, regular rhythm and intact distal pulses.  Exam reveals no gallop and no friction rub.   Murmur (1/6 SEM) heard. Trace LE edema b/l. No calf TTP. Right arm PICC line intact with no redness/secondary signs of infection  Pulmonary/Chest: Effort normal and breath sounds normal. He has no wheezes. He has no rales. He exhibits no tenderness.  Abdominal: Soft. Bowel sounds are normal. He exhibits no distension, no abdominal bruit, no pulsatile midline mass and no mass. There is no tenderness. There is no rebound and no guarding.  Genitourinary:  Foley DTG clear yellow urine  Musculoskeletal: He exhibits edema and tenderness.  Neurological: He is alert and oriented to person, place, and time.  L>R foot drop  Skin: Skin is warm and dry. No rash noted.  Psychiatric: He has a normal mood and affect. His behavior is normal.     Labs reviewed: Admission on 11/07/2015, Discharged on 12/02/2015  No results displayed because visit has over 200 results.  CBC Latest Ref Rng 12/01/2015 11/29/2015 11/27/2015  WBC 4.0 - 10.5 K/uL 9.3 7.2 12.4(H)  Hemoglobin 13.0 - 17.0 g/dL 8.3(L) 8.3(L) 9.4(L)  Hematocrit 39.0 - 52.0 % 27.3(L) 27.6(L) 30.2(L)  Platelets 150 - 400 K/uL 290 238 229    CMP Latest Ref Rng 12/02/2015 12/01/2015 11/29/2015  Glucose 65 - 99 mg/dL 146(H) 150(H) 110(H)  BUN 6 - 20 mg/dL 7 9 12   Creatinine 0.61 - 1.24 mg/dL 0.77 0.80 0.86  Sodium 135 - 145 mmol/L 137 136 137  Potassium 3.5 - 5.1 mmol/L 3.7 3.5 3.7  Chloride 101 - 111 mmol/L 102 100(L) 100(L)  CO2 22 - 32 mmol/L 26 25 27   Calcium 8.9 - 10.3 mg/dL 9.6 9.5 9.7   Total Protein 6.5 - 8.1 g/dL - - -  Total Bilirubin 0.3 - 1.2 mg/dL - - -  Alkaline Phos 38 - 126 U/L - - -  AST 15 - 41 U/L - - -  ALT 17 - 63 U/L - - -    Lipid Panel     Component Value Date/Time   TRIG 227* 11/21/2015 0426      Ct Abdomen Pelvis Wo Contrast  11/10/2015  CLINICAL DATA:  55 year old male with history of urinary tract infection. Ongoing fever. Evaluate for potential abscess. EXAM: CT ABDOMEN AND PELVIS WITHOUT CONTRAST TECHNIQUE: Multidetector CT imaging of the abdomen and pelvis was performed following the standard protocol without IV contrast. COMPARISON:  CT of the abdomen and pelvis 08/01/2015. FINDINGS: Lower chest: Bibasilar opacities in the lungs appear to be predominantly atelectatic, however, resolving areas of airspace consolidation could have a similar appearance. Trace right pleural effusion lying dependently. Hepatobiliary: No definite cystic or solid hepatic lesions are identified in the liver on today's noncontrast CT  examination. Unenhanced appearance of the gallbladder is unremarkable. Pancreas: No pancreatic mass or peripancreatic inflammatory changes on today's noncontrast CT examination. Spleen: Spleen is enlarged measuring 15.0 x 5.8 x 15.1 cm (estimated splenic volume of 657 mL). Adrenals/Urinary Tract: 2 mm calcification in the upper pole of the right kidney is favored to be vascular, but could alternatively reflect a small nonobstructive calculus. No other potential calculi are noted within the collecting system of the left kidney, along the course of either ureter or within the lumen of the urinary bladder. No hydroureteronephrosis to indicate urinary tract obstruction at this time. Mild bilateral perinephric stranding (nonspecific). Unenhanced appearance of the kidneys is otherwise unremarkable. Urinary bladder is nearly completely decompressed with a Foley balloon catheter in place. Bilateral adrenal glands are normal in appearance. Stomach/Bowel:  Unenhanced appearance of the stomach is normal. There is no pathologic dilatation of small bowel or colon. Normal appendix. Vascular/Lymphatic: Atherosclerotic calcifications throughout the abdominal and pelvic vasculature, without evidence of aneurysm. No lymphadenopathy noted in the abdomen or pelvis on today's noncontrast CT examination. Several borderline enlarged retroperitoneal lymph nodes are noted, measuring up to 9 mm in short axis in the left para-aortic nodal station (nonspecific). Reproductive: Prostate gland and seminal vesicles are unremarkable in appearance. Other: Soft tissue stranding around the urinary bladder, likely inflammatory. No well-defined fluid collection identified in the peritoneal cavity to suggest abscess at this time. No significant volume of ascites. No pneumoperitoneum. Musculoskeletal: There are no aggressive appearing lytic or blastic lesions noted in the visualized portions of the skeleton. IMPRESSION: 1. No abscess identified in the abdomen or pelvis. 2. Inflammatory changes in the fat adjacent to the urinary bladder, compatible with the reported clinical history of urinary tract infection. 3. Probable vascular calcification versus tiny 2 mm nonobstructive calculus in the upper pole of the right kidney. 4. Normal appendix. 5. Splenomegaly. 6. Areas of atelectasis or resolving airspace consolidation in the lung bases bilaterally with trace right pleural effusion. 7. Atherosclerosis. Electronically Signed   By: Vinnie Langton M.D.   On: 11/10/2015 08:24   Dg Cervical Spine 1 View  11/25/2015  CLINICAL DATA:  Anterior C5-6 corpectomy and fixation. EXAM: CERVICAL SPINE 1 VIEW COMPARISON:  MRI of 11/11/2015 FINDINGS: Anterior fixation beginning at C4 and extending through the C7 level. C5-6 corpectomy. No acute hardware complication identified. More inferiorly is not imaged. IMPRESSION: Intraoperative imaging. Electronically Signed   By: Abigail Miyamoto M.D.   On: 11/25/2015 20:39     Dg Cervical Spine 2 Or 3 Views  11/28/2015  CLINICAL DATA:  55 year old male with a history of neck pain after surgery EXAM: CERVICAL SPINE - 2-3 VIEW COMPARISON:  Intraoperative fluoroscopic spot images 11/25/2015 FINDINGS: Postoperative changes of C5 corpectomy with anterior plate and screw fixation spanning C4-C6. Note that the lower levels are not well evaluated secondary to overlying soft tissues. Expandable titanium cage appears well positioned. Significant prevertebral soft tissue swelling. Gas anterior to the C4 level. Compared to the intraoperative study, there is mild kyphosis. IMPRESSION: Postoperative changes of C5 corpectomy with anterior plate and screw fixation of C4-C6. Note that C6 and below not well visualized secondary to overlying soft tissues. Compared to the intraoperative fluoroscopic image there is mild increased kyphosis. Significant prevertebral soft tissue swelling and gas, favored to be postoperative changes. Signed, Dulcy Fanny. Earleen Newport, DO [facility] Electronically Signed   By: Corrie Mckusick D.O.   On: 11/28/2015 15:27   Ct Head Wo Contrast  11/07/2015  CLINICAL DATA:  Acute onset of lethargy and unresponsiveness. Initial encounter. EXAM: CT HEAD WITHOUT CONTRAST TECHNIQUE: Contiguous axial images were obtained from the base of the skull through the vertex without intravenous contrast. COMPARISON:  CT of the head performed 04/08/2012, and MRI of the brain performed 04/15/2012 FINDINGS: There is no evidence of acute infarction, mass lesion, or intra- or extra-axial hemorrhage on CT. Mild periventricular white matter change likely reflects small vessel ischemic microangiopathy. The posterior fossa, including the cerebellum, brainstem and fourth ventricle, is within normal limits. The third and lateral ventricles, and basal ganglia are unremarkable in appearance. The cerebral hemispheres are symmetric in appearance, with normal  gray-white differentiation. No mass effect or midline shift is seen. There is no evidence of fracture; visualized osseous structures are unremarkable in appearance. The orbits are within normal limits. The paranasal sinuses and mastoid air cells are well-aerated. No significant soft tissue abnormalities are seen. IMPRESSION: 1. No acute intracranial pathology seen on CT. 2. Mild small vessel ischemic microangiopathy. Electronically Signed   By: Garald Balding M.D.   On: 11/07/2015 03:44   Mr Cervical Spine W Wo Contrast  11/11/2015  CLINICAL DATA:  Urinary tract infection, persistent fever. History of MRSA osteomyelitis and bacteremia, diabetes, hypertension. EXAM: MRI CERVICAL SPINE WITHOUT AND WITH CONTRAST TECHNIQUE: Multiplanar and multiecho pulse sequences of the cervical spine, to include the craniocervical junction and cervicothoracic junction, were obtained according to standard protocol without and with intravenous contrast. Patient was unable to tolerate further imaging, and post gadolinium axial sequences not obtained. CONTRAST:  20 cc MultiHance COMPARISON:  None. FINDINGS: ALIGNMENT: Focal kyphosis C5-6.  No malalignment. VERTEBRAE/DISCS: Destructive C5-6 endplate changes and mild height loss associated with obliterated disc and effusion, associated abnormal enhancement within the C5-6 endplates and disc. The remaining intervertebral discs demonstrate relatively preserved morphology and signal. Mild chronic discogenic endplate changes at multiple levels. No STIR signal abnormality to suggest fracture. CORD:Mild T2 bright cord edema C4-5 through C6-7 without syrinx. No abnormal cord enhancement. POSTERIOR FOSSA, VERTEBRAL ARTERIES, PARASPINAL TISSUES: Minimal dorsal dural enhancement at C5-6 is well as trace is ventral epidural fluid collection at C5-6 compatible with abscess. Enhancing prevertebral phlegmon. Enhancing bright STIR signal within the C5-6 interspinous space extending to the paraspinal  soft tissues compatible with myositis. Life-support lines in place. Vertebral artery flow voids present. DISC LEVELS: C2-3: Uncovertebral hypertrophy without canal stenosis. Mild RIGHT neural foraminal narrowing. C3-4: Small RIGHT central to subarticular disc protrusion. Uncovertebral hypertrophy. Mild canal stenosis. Moderate to severe RIGHT and moderate LEFT neural foraminal narrowing. C4-5: Annular bulging, uncovertebral hypertrophy and tiny central disc protrusion. Mild canal stenosis. Moderate RIGHT neural foraminal narrowing. C5-6: Retropulsed disc material and uncal vertebral hypertrophy results in severe canal stenosis: AP dimension of the canal is 6 mm. Severe bilateral neural foraminal narrowing. C6-7: Small broad-based disc bulge, uncovertebral hypertrophy resulting in moderate canal stenosis. Severe bilateral neural foraminal narrowing. C7-T1: Small LEFT subarticular disc protrusion. No canal stenosis. Moderate to severe LEFT neural foraminal narrowing. 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 Associated paraspinal muscle edema/myositis. Severe canal stenosis at C5-6. C4-5 through C6-7 spinal cord edema/pre syrinx. No abnormal enhancement of the spinal cord. Moderate canal stenosis at C6-7, mild at C3-4 and C4-5. Multilevel neural foraminal narrowing: Severe at C5-6 and C6-7. Electronically Signed   By: Elon Alas M.D.   On: 11/11/2015 05:51   Mr Thoracic Spine W Wo Contrast  11/11/2015  CLINICAL DATA:  Urinary tract infection, persistent fever.  History of MRSA osteomyelitis and bacteremia, diabetes, hypertension. EXAM: MRI THORACIC AND LUMBAR SPINE WITHOUT AND WITH CONTRAST TECHNIQUE: Multiplanar and multiecho pulse sequences of the thoracic and lumbar spine were obtained without and with intravenous contrast. CONTRAST:  20 cc MultiHance COMPARISON:  CT abdomen and pelvis Nov 10, 2015 FINDINGS: MRI THORACIC SPINE FINDINGS (marked artifact, possibly  related life-support lines in 3 tesla environment) ALIGNMENT: Maintenance of the thoracic kyphosis. No malalignment. VERTEBRAE/DISCS: Vertebral bodies are intact. Intervertebral disc morphology's and signal are normal. No abnormal osseous or intradiscal enhancement. CORD: Thoracic spinal cord is normal morphology and signal characteristics to the level the conus medullaris which terminates at T12-L1. PREVERTEBRAL AND PARASPINAL SOFT TISSUES: Small pleural effusions and enhancing probable atelectasis or, possible pneumonia. DEGENERATIVE CHANGE: LEFT central T7-8 moderate disc protrusion resulting in mild canal stenosis. Limited assessment of neural foraminal narrowing due to poor image quality. MRI LUMBAR SPINE FINDINGS (low signal to noise ratio, further degraded by mild patient motion.) OSSEOUS STRUCTURES: Lumbar vertebral bodies intact. Grade 1 L4-5 anterolisthesis associated with bright STIR signal and enhancing bone marrow signal within the RIGHT L4-5 facet. Mild congenital canal narrowing on the basis of foreshortened pedicles. Mild L3-4, moderate L4-5 and mild L5-S1 disc height loss with decreased T2 signal within the lower lumbar disc compatible with mild desiccation. Mild acute on chronic discogenic endplate changes at X9-3. No STIR signal abnormality to suggest fracture. No vertebral body or intradiscal enhancement. SPINAL CORD: Conus medullaris terminates at T12 and appears normal morphology and signal characteristics. Central displacement of the cauda equina due to canal stenosis. No abnormal cord, leptomeningeal enhancement. SOFT TISSUES: Bright STIR signal versus artifact within the RIGHT psoas muscle. Paraspinal bright interstitial STIR signal. LEVEL BY LEVEL EVALUATION: L1-2: Annular bulging without canal stenosis or neural foraminal narrowing. L2-3: Annular bulging. Mild facet arthropathy and ligamentum flavum redundancy without canal stenosis. Mild LEFT neural foraminal narrowing. L3-4: Large 5 mm  broad-based disc bulge. Moderate facet arthropathy and ligamentum flavum redundancy. Severe canal stenosis. Moderate to severe bilateral neural foraminal narrowing. L4-5: Anterolisthesis. Moderate broad-based disc bulge. Severe facet arthropathy and ligamentum flavum redundancy with enhancing RIGHT facet. Severe canal stenosis. Severe RIGHT greater than LEFT neural foraminal narrowing. Abnormal signal within the RIGHT L4-5 lateral recess extend toward the RIGHT L5-S1 neural foramen, contiguous with the RIGHT facet with superimposed enhancement. 6 mm RIGHT facet synovial cyst, less likely abscess. Trace component extensive RIGHT ligamentum flavum. Bright STIR signal within the L4-5 interspinous space. L5-S1: Small broad-based disc bulge eccentric laterally. Mild facet arthropathy and ligamentum flavum redundancy without canal stenosis. Moderate RIGHT, mild LEFT neural foraminal narrowing. IMPRESSION: MRI THORACIC SPINE: No MR findings of infection within the thoracic spine. Moderate LEFT central T7-8 disc protrusion resulting in mild canal stenosis. MRI LUMBAR SPINE: Grade 1 L4-5 anterolisthesis with severe L4-5 RIGHT facet arthropathy, inflammation versus infectious bone marrow changes and facet cyst versus abscess. Possible RIGHT psoas muscle myositis. Severe canal stenosis L3-4 and L4-5. Severe L4-5 and moderate to severe L3-4 neural foraminal narrowing. Electronically Signed   By: Elon Alas M.D.   On: 11/11/2015 06:13   Mr Lumbar Spine W Wo Contrast  11/11/2015  CLINICAL DATA:  Urinary tract infection, persistent fever. History of MRSA osteomyelitis and bacteremia, diabetes, hypertension. EXAM: MRI THORACIC AND LUMBAR SPINE WITHOUT AND WITH CONTRAST TECHNIQUE: Multiplanar and multiecho pulse sequences of the thoracic and lumbar spine were obtained without and with intravenous contrast. CONTRAST:  20 cc MultiHance COMPARISON:  CT abdomen and pelvis Nov 10, 2015 FINDINGS: MRI THORACIC SPINE FINDINGS  (marked artifact, possibly related life-support lines in 3 tesla environment) ALIGNMENT: Maintenance of the thoracic kyphosis. No malalignment. VERTEBRAE/DISCS: Vertebral bodies are intact. Intervertebral disc morphology's and signal are normal. No abnormal osseous or intradiscal enhancement. CORD: Thoracic spinal cord is normal morphology and signal characteristics to the level the conus medullaris which terminates at T12-L1. PREVERTEBRAL AND PARASPINAL SOFT TISSUES: Small pleural effusions and enhancing probable atelectasis or, possible pneumonia. DEGENERATIVE CHANGE: LEFT central T7-8 moderate disc protrusion resulting in mild canal stenosis. Limited assessment of neural foraminal narrowing due to poor image quality. MRI LUMBAR SPINE FINDINGS (low signal to noise ratio, further degraded by mild patient motion.) OSSEOUS STRUCTURES: Lumbar vertebral bodies intact. Grade 1 L4-5 anterolisthesis associated with bright STIR signal and enhancing bone marrow signal within the RIGHT L4-5 facet. Mild congenital canal narrowing on the basis of foreshortened pedicles. Mild L3-4, moderate L4-5 and mild L5-S1 disc height loss with decreased T2 signal within the lower lumbar disc compatible with mild desiccation. Mild acute on chronic discogenic endplate changes at Z6-6. No STIR signal abnormality to suggest fracture. No vertebral body or intradiscal enhancement. SPINAL CORD: Conus medullaris terminates at T12 and appears normal morphology and signal characteristics. Central displacement of the cauda equina due to canal stenosis. No abnormal cord, leptomeningeal enhancement. SOFT TISSUES: Bright STIR signal versus artifact within the RIGHT psoas muscle. Paraspinal bright interstitial STIR signal. LEVEL BY LEVEL EVALUATION: L1-2: Annular bulging without canal stenosis or neural foraminal narrowing. L2-3: Annular bulging. Mild facet arthropathy and ligamentum flavum redundancy without canal stenosis. Mild LEFT neural foraminal  narrowing. L3-4: Large 5 mm broad-based disc bulge. Moderate facet arthropathy and ligamentum flavum redundancy. Severe canal stenosis. Moderate to severe bilateral neural foraminal narrowing. L4-5: Anterolisthesis. Moderate broad-based disc bulge. Severe facet arthropathy and ligamentum flavum redundancy with enhancing RIGHT facet. Severe canal stenosis. Severe RIGHT greater than LEFT neural foraminal narrowing. Abnormal signal within the RIGHT L4-5 lateral recess extend toward the RIGHT L5-S1 neural foramen, contiguous with the RIGHT facet with superimposed enhancement. 6 mm RIGHT facet synovial cyst, less likely abscess. Trace component extensive RIGHT ligamentum flavum. Bright STIR signal within the L4-5 interspinous space. L5-S1: Small broad-based disc bulge eccentric laterally. Mild facet arthropathy and ligamentum flavum redundancy without canal stenosis. Moderate RIGHT, mild LEFT neural foraminal narrowing. IMPRESSION: MRI THORACIC SPINE: No MR findings of infection within the thoracic spine. Moderate LEFT central T7-8 disc protrusion resulting in mild canal stenosis. MRI LUMBAR SPINE: Grade 1 L4-5 anterolisthesis with severe L4-5 RIGHT facet arthropathy, inflammation versus infectious bone marrow changes and facet cyst versus abscess. Possible RIGHT psoas muscle myositis. Severe canal stenosis L3-4 and L4-5. Severe L4-5 and moderate to severe L3-4 neural foraminal narrowing. Electronically Signed   By: Elon Alas M.D.   On: 11/11/2015 06:13   US Renal Port  11/09/2015  CLINICAL DATA:  UTI EXAM: RENAL / URINARY TRACT ULTRASOUND COMPLETE COMPARISON:  08/01/2015 FINDINGS: Right Kidney: Length: 15.5 cm. Echogenicity within normal limits. No mass or hydronephrosis visualized. Left Kidney: Length: 16.1 cm. Echogenicity within normal limits. No mass or hydronephrosis visualized. Bladder: Decompressed by Foley catheter. IMPRESSION: Normal kidneys bilaterally. Electronically Signed   By: Inez Catalina M.D.    On: 11/09/2015 11:20   Dg Chest Port 1 View  11/26/2015  CLINICAL DATA:  55 year old male with a history of reason surgery. Osteomyelitis discitis C5-C6 with corpectomy C5. EXAM: PORTABLE CHEST 1 VIEW COMPARISON:  11/12/2015 FINDINGS: Cardiomediastinal silhouette unchanged. Right upper extremity PICC has  been placed. Since the prior chest x-ray there has been extubation and removal of gastric tube. Low lung volumes with interstitial opacities. No confluent airspace disease or large pleural effusion. IMPRESSION: Low lung volumes with interstitial opacities, most likely atelectasis. No large pleural effusion or evidence of lobar pneumonia. Right upper extremity PICC. Since the prior plain film there has been removal of endotracheal tube and gastric tube. Signed, Dulcy Fanny. Earleen Newport, DO [facility] Electronically Signed   By: Corrie Mckusick D.O.   On: 11/26/2015 10:52   Dg Chest Port 1 View  11/12/2015  CLINICAL DATA:  Endotracheal tube placement. Acute respiratory failure with hypoxemia. EXAM: PORTABLE CHEST 1 VIEW COMPARISON:  Nov 10, 2015. FINDINGS: Endotracheal and nasogastric tubes are unchanged in position. Left internal jugular catheter is unchanged in position. No pneumothorax is noted. Increased bilateral perihilar and basilar opacities are noted concerning for worsening edema or atelectasis. Bony thorax is unremarkable. IMPRESSION: Increased bilateral perihilar and basilar opacities concerning for worsening edema or atelectasis. Stable support apparatus. Electronically Signed   By: Marijo Conception, M.D.   On: 11/12/2015 13:59   Dg Chest Port 1 View  11/10/2015  CLINICAL DATA:  Respiratory failure. EXAM: PORTABLE CHEST 1 VIEW COMPARISON:  11/09/2015 . FINDINGS: Endotracheal tube, left IJ line, NG tube in stable position. Stable cardiomegaly . Low lung volumes with mild bibasilar atelectasis. No pleural effusion or pneumothorax. IMPRESSION: 1.  Lines and tubes in stable position. 2. Low lung volumes with mild bibasilar atelectasis again noted. 3. Stable cardiomegaly. Electronically Signed   By: Marcello Moores  Register   On: 11/10/2015 07:06   Dg Chest Port 1 View  11/09/2015  CLINICAL DATA:  Intubation.  Shortness of breath. EXAM: PORTABLE CHEST 1 VIEW COMPARISON:  11/08/2015. FINDINGS: Endotracheal tube, left IJ line, NG tube in stable position. Mild cardiomegaly, no pulmonary venous congestion. Low lung volumes with mild bibasilar atelectasis. No pleural effusion or pneumothorax. IMPRESSION: 1. Lines and tubes in stable position. 2. Low lung volumes with mild bibasilar atelectasis. 3. Mild cardiomegaly.  No pulmonary venous congestion. Electronically Signed   By: Marcello Moores  Register   On: 11/09/2015 07:09   Dg Chest Port 1 View  11/08/2015  CLINICAL DATA:  Respiratory failure.  Shortness of breath. EXAM: PORTABLE CHEST 1 VIEW COMPARISON:  11/07/2015. FINDINGS: Endotracheal tube, left IJ line, NG tube in stable position. Heart size normal. Low lung volumes with bibasilar atelectasis and/or infiltrates. No pleural effusion or pneumothorax. IMPRESSION: 1. Lines and tubes in stable position. 2.  Low lung volumes with bibasilar atelectasis and/or infiltrates. Electronically Signed   By: Marcello Moores  Register   On: 11/08/2015 07:05   Dg Chest Port 1 View  11/07/2015  CLINICAL DATA:  Central line placement EXAM: PORTABLE CHEST 1 VIEW COMPARISON:  11/07/2015 FINDINGS: Stable endotracheal tube and nasogastric tube. Left internal jugular line has been placed with tip over the superior vena cava. No pneumothorax identified. Stable mild cardiac enlargement. Lungs clear. IMPRESSION: Central line as described with no pneumothorax visible. Electronically Signed   By: Skipper Cliche M.D.   On: 11/07/2015 12:20   Dg Chest Port 1 View  11/07/2015  CLINICAL DATA:  Altered mental status, post intubation. EXAM: PORTABLE CHEST 1 VIEW COMPARISON:  Earlier this day at 0146 hour  FINDINGS: Endotracheal tube 3.5 cm from the carina. Tip of the enteric tube below the diaphragm not included in the field of view. Low lung volumes persist. Left basilar atelectasis is unchanged. Cardiomediastinal contours  are unchanged allowing for differences in technique. No new airspace disease. IMPRESSION: Endotracheal tube 3.5 cm from the carina.  Enteric tube in place. Persistent low lung volumes and left basilar atelectasis. Electronically Signed   By: Jeb Levering M.D.   On: 11/07/2015 05:09   Dg Chest Port 1 View  11/07/2015  CLINICAL DATA:  Hypoxia.  Patient not responsive. EXAM: PORTABLE CHEST 1 VIEW COMPARISON:  Most recent radiographs 08/08/2015 FINDINGS: Low lung volumes and supine positioning limits assessment. Borderline mild cardiomegaly. No confluent airspace disease. Probable left basilar atelectasis. No large pleural effusion or pneumothorax. IMPRESSION: Hypoventilatory chest with left basilar atelectasis. Evaluation limited by portable supine technique and low lung volumes. Recommend repeat PA and lateral exam when patient is able. Electronically Signed   By: Jeb Levering M.D.   On: 11/07/2015 02:52   Dg Abd Portable 1v  11/10/2015  CLINICAL DATA:  Orogastric tube placement EXAM: PORTABLE ABDOMEN - 1 VIEW COMPARISON:  None. FINDINGS: The there is normal small bowel gas pattern. There is NG tube coiled within proximal stomach with tip in gastric fundus. Moderate gas and stool noted within transverse colon and proximal left colon. IMPRESSION: NG tube coiled within proximal stomach with tip in gastric fundus. Moderate stool and gas noted in visualized colon. Electronically Signed   By: Lahoma Crocker M.D.   On: 11/10/2015 11:28   Dg Foot 2 Views Right  11/11/2015  CLINICAL DATA:  Wound on the tip of the right great toe. EXAM: RIGHT FOOT - 2 VIEW COMPARISON:  None. FINDINGS: There is no evidence of fracture or dislocation. There is no evidence of arthropathy or other focal bone  abnormality. Soft tissue ulcer at the tip of the first distal phalanx. IMPRESSION: Soft tissue ulcer at the tip of the first distal phalanx. No evidence of osteomyelitis of the right foot. Electronically Signed   By: Kathreen Devoid   On: 11/11/2015 19:38   Dg C-arm 1-60 Min  11/25/2015  CLINICAL DATA:  C5-C6 corpectomy. EXAM: DG C-ARM 61-120 MIN COMPARISON:  None. FINDINGS: C5 and C6 have been resected with surgical hardware in place of the previous vertebral bodies. Anterior plate and screws span from C4 through C7. Hardware is in good position on this single lateral view. IMPRESSION: Status post C5-6 corpectomy. Electronically Signed   By: Dorise Bullion III M.D   On: 11/25/2015 20:35   Dg C-arm 1-60 Min-no Report  11/29/2015  CLINICAL DATA: for posterior cervical fusion C-ARM 1-60 MINUTES Fluoroscopy was utilized by the requesting physician.  No radiographic interpretation.     Assessment/Plan   ICD-9-CM ICD-10-CM   1. Discitis of cervical region 722.91 M46.42   2. Osteomyelitis of cervical spine (HCC) 730.28 M46.22   3. Urinary retention 788.20 R33.9   4. 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 CAP (community acquired pneumonia) due to MRSA (methicillin resistant Staphylococcus aureus) ([place]) 482.42 J15.212   6. Uncontrolled type 2 diabetes mellitus with complication, with long-term current use of insulin (HCC) 250.82 E11.8    V58.67 E11.65     Z79.4   7. Essential hypertension 401.9 I10   8. Chronic diastolic CHF (congestive heart failure) (HCC) 428.32 I50.32    428.0    9. Chronic obstructive pulmonary disease, unspecified COPD type ([place]) 496 J44.9   10. HLD (hyperlipidemia) 272.4 E78.5   11. Anemia due to other cause 285.8 D64.89     Contact isolation for MRSA respiratory  Will need lipid panel next month  Cont current meds as ordered  vanco dosed per pharmacy through 7/15th. Trough goal 15-20  PT/OT/ST as ordered  F/u with  specialists as scheduled  PICC line care as indicated  Foley cath care as indicated  Nutritional supplements as ordered  GOAL: short term rehab and d/c home when medically appropriate. Communicated with pt and nursing.  Will follow  Borghild Thaker S. Perlie Gold  [facility] [address] [place], [place] [id number] [phone] Cell (Monday-Friday 8 AM - 5 PM) [phone] After 5 PM and follow prompts

## 2015-12-08 ENCOUNTER — Non-Acute Institutional Stay (SKILLED_NURSING_FACILITY): Payer: Medicaid Other | Admitting: Adult Health

## 2015-12-08 DIAGNOSIS — L97512 Non-pressure chronic ulcer of other part of right foot with fat layer exposed: Secondary | ICD-10-CM

## 2015-12-15 DIAGNOSIS — M4802 Spinal stenosis, cervical region: Secondary | ICD-10-CM | POA: Insufficient documentation

## 2015-12-15 DIAGNOSIS — E1149 Type 2 diabetes mellitus with other diabetic neurological complication: Secondary | ICD-10-CM | POA: Insufficient documentation

## 2015-12-15 DIAGNOSIS — I110 Hypertensive heart disease with heart failure: Secondary | ICD-10-CM | POA: Insufficient documentation

## 2015-12-27 ENCOUNTER — Encounter: Payer: Self-pay | Admitting: Infectious Disease

## 2015-12-27 ENCOUNTER — Ambulatory Visit (INDEPENDENT_AMBULATORY_CARE_PROVIDER_SITE_OTHER): Payer: Self-pay | Admitting: Infectious Disease

## 2015-12-27 VITALS — BP 89/60 | HR 85 | Temp 97.5°F

## 2015-12-27 DIAGNOSIS — A4102 Sepsis due to Methicillin resistant Staphylococcus aureus: Secondary | ICD-10-CM

## 2015-12-27 DIAGNOSIS — M4642 Discitis, unspecified, cervical region: Secondary | ICD-10-CM

## 2015-12-27 DIAGNOSIS — R7881 Bacteremia: Secondary | ICD-10-CM

## 2015-12-27 DIAGNOSIS — M4622 Osteomyelitis of vertebra, cervical region: Secondary | ICD-10-CM

## 2015-12-27 DIAGNOSIS — M4646 Discitis, unspecified, lumbar region: Secondary | ICD-10-CM

## 2015-12-27 DIAGNOSIS — M869 Osteomyelitis, unspecified: Secondary | ICD-10-CM

## 2015-12-27 DIAGNOSIS — M21371 Foot drop, right foot: Secondary | ICD-10-CM

## 2015-12-27 DIAGNOSIS — R29898 Other symptoms and signs involving the musculoskeletal system: Secondary | ICD-10-CM

## 2015-12-27 DIAGNOSIS — B9562 Methicillin resistant Staphylococcus aureus infection as the cause of diseases classified elsewhere: Secondary | ICD-10-CM

## 2015-12-27 DIAGNOSIS — G061 Intraspinal abscess and granuloma: Secondary | ICD-10-CM

## 2015-12-27 DIAGNOSIS — M4802 Spinal stenosis, cervical region: Secondary | ICD-10-CM

## 2015-12-27 HISTORY — DX: Foot drop, right foot: M21.371

## 2015-12-27 NOTE — Progress Notes (Signed)
Location:   Psychiatrist of Service:  SNF (31)     No Known Allergies  Chief Complaint  Patient presents with  . Acute Visit    wound management     HPI:  I have been asked to review his right great toe wound. His toe is painful to touch. He is presently being treated MRSA infection. There are no indications of infection present in the toe. There are no reports of fever present.    Past Medical History  Diagnosis Date  . Diabetes mellitus without complication ([place])   . Hypertension   . DDD (degenerative disc disease), lumbar   . Osteomyelitis ([place])   . Urinary retention   . COPD (chronic obstructive pulmonary disease) ([place])   . Osteomyelitis ([place])   . MRSA bacteremia   . Chronic back pain   . Anemia   . Acute respiratory failure ([place])   . Urinary retention     Past Surgical History  Procedure Laterality Date  . Anterior cervical corpectomy N/A 11/25/2015    Procedure: ANTERIOR CERVICAL FIVE CORPECTOMY Cervical four - six fusion;  Surgeon: Consuella Lose, MD;  Location: [facility];  Service: Neurosurgery;  Laterality: N/A;  ANTERIOR CERVICAL FIVE CORPECTOMY Cervical four - six fusion  . Posterior cervical fusion/foraminotomy N/A 11/29/2015    Procedure: Cervical Three-Cervical Seven Posterior Cervical Laminectomy with Fusion;  Surgeon: Consuella Lose, MD;  Location: [facility];  Service: Neurosurgery;  Laterality: N/A;  Cervical Three-Cervical Seven Posterior Cervical Laminectomy with Fusion    Social History   Social History  . Marital Status: Single    Spouse Name: N/A  . Number of Children: N/A  . Years of Education: N/A   Occupational History  . Not on file.   Social History Main Topics  . Smoking status: Former Smoker -- 3.00 packs/day    Types: Cigarettes  . Smokeless tobacco: Never Used  . Alcohol Use: No  . Drug Use: No  . Sexual Activity: Not on file   Other Topics Concern  . Not on file   Social History Narrative    Family History  Problem Relation Age of Onset  . Cancer Other   . Diabetes Other       VITAL SIGNS BP 100/60 mmHg  Pulse 70  Ht 6' (1.829 m)  Wt 181 lb 9.6 oz (82.373 kg)  BMI 24.62 kg/m2  Patient's Medications  New Prescriptions   No medications on file  Previous Medications   ALBUTEROL (PROVENTIL HFA;VENTOLIN HFA) 108 (90 BASE) MCG/ACT INHALER    Inhale 2 puffs into the lungs every 6 (six) hours as needed for wheezing or shortness of breath.   ALUM & MAG HYDROXIDE-SIMETH (MAALOX PLUS) 400-400-40 MG/5ML SUSPENSION    Take 30 mLs by mouth every 6 (six) hours as needed for indigestion.   AMINO ACIDS-PROTEIN HYDROLYS (FEEDING SUPPLEMENT, PRO-STAT SUGAR FREE 64,) LIQD    Take 30 mLs by mouth 3 (three) times daily with meals.   BISACODYL (DULCOLAX) 10 MG SUPPOSITORY    Place 1 suppository (10 mg total) rectally daily as needed for moderate constipation.   COLLAGENASE (SANTYL) OINTMENT    Apply topically daily.   DIAZEPAM (VALIUM) 5 MG TABLET    Take 1 tablet (5 mg total) by mouth every 8 (eight) hours as needed for anxiety or muscle spasms.   FEEDING SUPPLEMENT (BOOST / RESOURCE BREEZE) LIQD    Take 1 Container by mouth 3 (three) times daily  between meals.   FUROSEMIDE (LASIX) 40 MG TABLET    Take 40 mg by mouth 2 (two) times daily.    GLIMEPIRIDE (AMARYL) 2 MG TABLET    Take 2 mg by mouth daily with breakfast.   GLUCAGON, HUMAN RECOMBINANT, (GLUCAGEN) 1 MG INJECTION    Inject 1 mg into the vein once as needed for low blood sugar.   HEPARIN FLUSH 10 UNIT/ML SOLN INJECTION    Inject 5 mLs into the vein 2 (two) times daily.   INSULIN GLARGINE (LANTUS) 100 UNIT/ML INJECTION    Inject 0.1 mLs (10 Units total) into the skin at bedtime.   INSULIN LISPRO (HUMALOG) 100 UNIT/ML INJECTION    Inject 0-5 Units into the skin 3 (three) times daily before meals. If 0-149=0 units, 150-600=5 units   LISINOPRIL (PRINIVIL,ZESTRIL) 10 MG TABLET    Take 10 mg by mouth daily.   MAGNESIUM OXIDE (MAG-OX)  400 MG TABLET    Take 400 mg by mouth 2 (two) times daily.   METFORMIN (GLUCOPHAGE) 500 MG TABLET    Take 1 tablet (500 mg total) by mouth 2 (two) times daily with a meal.   METOPROLOL SUCCINATE (TOPROL-XL) 50 MG 24 HR TABLET    Take 50 mg by mouth daily. Take with or immediately following a meal.   MULTIPLE VITAMINS-MINERALS (DECUBI-VITE PO)    Take 1 tablet by mouth daily.   OXYCODONE (OXYCONTIN) 40 MG 12 HR TABLET    Take 1 tablet (40 mg total) by mouth every 12 (twelve) hours.   OXYCODONE-ACETAMINOPHEN (ROXICET) 5-325 MG TABLET    Take 1 tablet by mouth every 8 (eight) hours as needed for moderate pain or severe pain.   PAROXETINE (PAXIL) 40 MG TABLET    Take 40 mg by mouth every morning.   POLYETHYLENE GLYCOL (MIRALAX / GLYCOLAX) PACKET    Take 17 g by mouth daily. Hold if diarrhea   POTASSIUM CHLORIDE (K-DUR) 10 MEQ TABLET    Take 2 tablets (20 mEq total) by mouth daily.   PREGABALIN (LYRICA) 75 MG CAPSULE    Take 1 capsule (75 mg total) by mouth every 8 (eight) hours.   SENNA-DOCUSATE (SENOKOT-S) 8.6-50 MG TABLET    Take 2 tablets by mouth at bedtime.   SODIUM CHLORIDE (OCEAN) 0.65 % SOLN NASAL SPRAY    Place 1 spray into both nostrils as needed for congestion.   SODIUM CHLORIDE FLUSH (NORMAL SALINE FLUSH) 0.9 % SOLN    Inject 10 mLs into the vein 2 (two) times daily. Flush before and after antibiotic   TAMSULOSIN (FLOMAX) 0.4 MG CAPS CAPSULE    Take 0.4 mg by mouth daily.    TIZANIDINE (ZANAFLEX) 2 MG CAPSULE    Take 1 capsule (2 mg total) by mouth 3 (three) times daily as needed for muscle spasms.   TRAMADOL (ULTRAM) 50 MG TABLET    Take 1 tablet (50 mg total) by mouth every 6 (six) hours as needed for moderate pain.   VANCOMYCIN IN SODIUM CHLORIDE 0.9 % 500 ML    Will need to continue iv vanc per pharmacy  through July 15th per SNF protocol Trough goal of 15-20 Twice weekly bmp, vancomycin trough  repeat ESR  in 2 weeks Report result to infectious disease   VICTOZA 18 MG/3ML SOPN     Inject 1.2 mg into the skin 2 (two) times daily.   Modified Medications   No medications on file  Discontinued Medications   No medications on file  SIGNIFICANT DIAGNOSTIC EXAMS   11-07-15: EEG: This EEG is indicative of sedated sleep.  No focal or epileptiform activity is seen  11-07-15: ct of head: 1. No acute intracranial pathology seen on CT. 2. Mild small vessel ischemic microangiopathy.   11-09-15: renal ultrasound: Normal kidneys bilaterally  11-10-15: ct of abdomen and pelvis: 1. No abscess identified in the abdomen or pelvis. 2. Inflammatory changes in the fat adjacent to the urinary bladder, compatible with the reported clinical history of urinary tract infection. 3. Probable vascular calcification versus tiny 2 mm nonobstructive calculus in the upper pole of the right kidney. 4. Normal appendix. 5. Splenomegaly. 6. Areas of atelectasis or resolving airspace consolidation in the lung bases bilaterally with trace right pleural effusion. 7. Atherosclerosis.  11-11-15: mri of thoracic and lumbar spine:  MRI THORACIC SPINE: No MR findings of infection within the thoracic spine. Moderate LEFT central T7-8 disc protrusion resulting in mild canal stenosis.   MRI LUMBAR SPINE: Grade 1 L4-5 anterolisthesis with severe L4-5 RIGHT facet arthropathy, inflammation versus infectious bone marrow changes and facet cyst versus abscess. Possible RIGHT psoas muscle myositis. Severe canal stenosis L3-4 and L4-5. Severe L4-5 and moderate to severe L3-4 neural foraminal narrowing.  11-11-15: mri of cervical spine: C5-6 discitis osteomyelitis with trace epidural abscess at this level. Associated paraspinal muscle edema/myositis. Severe canal stenosis at C5-6. C4-5 through C6-7 spinal cord edema/pre syrinx. No abnormal enhancement of the spinal cord. Moderate canal stenosis at C6-7, mild at C3-4 and C4-5. Multilevel neural foraminal narrowing: Severe at C5-6 and C6-7.  11-20-15: 2-d echo; - Left  ventricle: The cavity size was normal. There was mild concentric hypertrophy. Systolic function was normal. The estimated ejection fraction was in the range of 55% to 60%. Doppler parameters are consistent with abnormal left ventricular relaxation (grade 1 diastolic dysfunction). - Pericardium, extracardiac: A small pericardial effusion was identified anterior to the heart. There was no evidence of hemodynamic compromise.   11-26-15: chest x-ray: Low lung volumes with interstitial opacities, most likely atelectasis. No large pleural effusion or evidence of lobar pneumonia. Right upper extremity PICC. Since the prior plain film there has been removal of endotracheal tube and gastric tube.  11-28-15: cervical spine x-ray: Postoperative changes of C5 corpectomy with anterior plate and screw fixation of C4-C6. Note that C6 and below not well visualized secondary to overlying soft tissues. Compared to the intraoperative fluoroscopic image there is mild increased kyphosis. Significant prevertebral soft tissue swelling and gas, favored to be postoperative changes.    LABS REVIEWED:   11-29-15: wbc 7.2; hgb 8.3; hct 27.6; mcv 85.4; plt 238; glucose 110; bun 12; creat 0.86; k+ 3.7; na++ 137 12-01-15: wbc 9.3; hgb 8.3; hct 27.3 mcv 83.5; plt 290; glucose 150; bun 9; creat 0.80; k+ 3.5; na++ 136; sed rate 132   Review of Systems  Constitutional: Negative for malaise/fatigue.  Respiratory: Negative for cough and shortness of breath.   Cardiovascular: Negative for chest pain, palpitations and leg swelling.  Gastrointestinal: Negative for heartburn, abdominal pain and constipation.  Musculoskeletal: Negative for myalgias, back pain and joint pain.  Skin:has right great toe ulcer  Neurological: Negative for dizziness.  Psychiatric/Behavioral: The patient is not nervous/anxious.     Physical Exam  Constitutional: He is oriented to person, place, and time. No distress.  Overweight   Eyes: Conjunctivae are  normal.  Neck: Neck supple. No JVD present. No thyromegaly present.  Cardiovascular: Normal rate, regular rhythm and intact distal pulses.   Respiratory: Effort normal  and breath sounds normal. No respiratory distress. He has no wheezes.  GI: Soft. Bowel sounds are normal. He exhibits no distension. There is no tenderness.  Musculoskeletal: He exhibits no edema.  Able to move all extremities  Has neck collar in place  Lymphadenopathy:    He has no cervical adenopathy.  Neurological: He is alert and oriented to person, place, and time.  Skin: Skin is warm and dry. He is not diaphoretic.  Incision line without signs of infection present picc line in place  Right great toe: 0.6 x 1.4 x 0.2 cm 90% slough treat with santyl   Psychiatric: He has a normal mood and affect.     ASSESSMENT/ PLAN:  1. Right great toe ulceration: will continue santyl treatment and will continue to monitor his status; will continue supplements per facility protocol.    Ok Edwards NP [facility]  Contact [phone] Monday through Friday 8am- 5pm  After hours call [phone]

## 2015-12-27 NOTE — Patient Instructions (Signed)
I would like you to stop your lisinopril and Lasix for now  I think you should see a Primary Care MD or Nursing Home Provider  to decide if you need to continue your metoprolol  We will continue your vancomycin IV with target trough 15-20 through July 26th, 2017  After we finish those antibiotics then we will DC them and the PICC and start you on oral doxycycline 100mg  twice daily for at least a month  I would like you to come back and see myself or Dr. Linus Salmons in 6-8 weeks

## 2015-12-27 NOTE — Progress Notes (Signed)
Chief complaint: followup for MRSA bacteremia disseminated infection including cervical and lumbar diskitis including large epidural abscess sp Neurosurgery with decompressive laminectomy  Subjective:    Patient ID: Gregory [name], male    DOB: April 11, 1961, 55 y.o.   MRN: [id number]  HPI  55 y.o. 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 This was first presented to [facility] with shoulder and back pain on 08/01/2015. He was found to have bacteremia from 2/13-2/25. Eventually transferred to [facility] for management of MRSA disseminated disease. He was found to have multiple sources suggesting osteomyelitis though did not undergo debridement. His hospital stay was complicated by respiratory failure requiring intubation 2/2 possible oversedation vs worsening pneumonia. He was discharged to SNF for prolonged abtx therapy which ended on may 1st, and had catheter removed by IR on 5/11. It does not look like he had further abtx. He then presented to [facility] on 5/22 with AMS though to be due to opiates, hypoxia, and high fever which have persisted despite abtx. Infectious work up,  . Repeat imaging of spine suggests ongoing destruction of cervical spine. And large epidural abscess   He ultimately underwent   1. C3-7 laminectomy for decompression of spinal cord 2. Placement of posterior segmental instrumentation, C3-7 Medtronic lateral mass 3. Posterolateral arthrodesis, C3-4, C4-5, C5-6, C6-7 4. Use of non-structural morcellized bone allograft (Vitoss BA, DBX putty)  On June 9th. I do not see any cultures from that surgery. He has been on IV vancomycin since then and close to completing 4 weeks of postop antibiotics. He states that his left shoulder no longer hurts as it once did. He  showed me an ulcer on his lower right foot where he has foot drop  His BP was fairly low and I noted that he had lost 50# of weight throughout the above ordeal  He states that his strength is improving though he occasionally has spasms of his arms and his neck pain lessening.  Past Medical History  Diagnosis Date  . Diabetes mellitus without complication ([place])   . Hypertension   . DDD (degenerative disc disease), lumbar   . Osteomyelitis ([place])   . Urinary retention   . COPD (chronic obstructive pulmonary disease) ([place])   . Osteomyelitis ([place])   . MRSA bacteremia   . Chronic back pain   . Anemia   . Acute respiratory failure ([place])   . Urinary retention     Past Surgical History  Procedure Laterality Date  . Anterior cervical corpectomy N/A 11/25/2015    Procedure: ANTERIOR CERVICAL FIVE CORPECTOMY Cervical four - six fusion;  Surgeon: Consuella Lose, MD;  Location: [place] NEURO ORS;  Service: Neurosurgery;  Laterality: N/A;  ANTERIOR CERVICAL FIVE CORPECTOMY Cervical four - six fusion  . Posterior cervical fusion/foraminotomy N/A 11/29/2015    Procedure: Cervical Three-Cervical Seven Posterior Cervical Laminectomy with Fusion;  Surgeon: Consuella Lose, MD;  Location: [place] NEURO ORS;  Service: Neurosurgery;  Laterality: N/A;  Cervical Three-Cervical Seven Posterior Cervical Laminectomy with Fusion    Family History  Problem Relation Age of Onset  . Cancer Other   . Diabetes Other       Social History   Social History  . Marital Status: Single    Spouse Name: N/A  . Number of Children: N/A  . Years  of Education: N/A   Social History Main Topics  . Smoking status: Former Smoker -- 3.00 packs/day    Types: Cigarettes  . Smokeless tobacco: Never Used  . Alcohol Use: No  . Drug Use: No  . Sexual Activity: Not Asked   Other Topics Concern  . None   Social History Narrative    No Known Allergies   Current outpatient prescriptions:  .  albuterol (PROVENTIL  HFA;VENTOLIN HFA) 108 (90 Base) MCG/ACT inhaler, Inhale 2 puffs into the lungs every 6 (six) hours as needed for wheezing or shortness of breath., Disp: , Rfl:  .  alum & mag hydroxide-simeth (MAALOX PLUS) 400-400-40 MG/5ML suspension, Take 30 mLs by mouth every 6 (six) hours as needed for indigestion., Disp: , Rfl:  .  Amino Acids-Protein Hydrolys (FEEDING SUPPLEMENT, PRO-STAT SUGAR FREE 64,) LIQD, Take 30 mLs by mouth 3 (three) times daily with meals., Disp: , Rfl:  .  bisacodyl (DULCOLAX) 10 MG suppository, Place 1 suppository (10 mg total) rectally daily as needed for moderate constipation., Disp: 12 suppository, Rfl: 0 .  collagenase (SANTYL) ointment, Apply topically daily., Disp: 15 g, Rfl: 0 .  diazepam (VALIUM) 5 MG tablet, Take 1 tablet (5 mg total) by mouth every 8 (eight) hours as needed for anxiety or muscle spasms., Disp: 10 tablet, Rfl: 0 .  feeding supplement (BOOST / RESOURCE BREEZE) LIQD, Take 1 Container by mouth 3 (three) times daily between meals., Disp: 30 Container, Rfl: 0 .  furosemide (LASIX) 40 MG tablet, Take 40 mg by mouth 2 (two) times daily. , Disp: , Rfl:  .  glimepiride (AMARYL) 2 MG tablet, Take 2 mg by mouth daily with breakfast., Disp: , Rfl:  .  glucagon, human recombinant, (GLUCAGEN) 1 MG injection, Inject 1 mg into the vein once as needed for low blood sugar., Disp: , Rfl:  .  heparin flush 10 UNIT/ML SOLN injection, Inject 5 mLs into the vein 2 (two) times daily., Disp: , Rfl:  .  insulin glargine (LANTUS) 100 UNIT/ML injection, Inject 0.1 mLs (10 Units total) into the skin at bedtime., Disp: 10 mL, Rfl: 0 .  insulin lispro (HUMALOG) 100 UNIT/ML injection, Inject 0-5 Units into the skin 3 (three) times daily before meals. If 0-149=0 units, 150-600=5 units, Disp: , Rfl:  .  lisinopril (PRINIVIL,ZESTRIL) 10 MG tablet, Take 10 mg by mouth daily., Disp: , Rfl:  .  magnesium oxide (MAG-OX) 400 MG tablet, Take 400 mg by mouth 2 (two) times daily., Disp: , Rfl:  .   metFORMIN (GLUCOPHAGE) 500 MG tablet, Take 1 tablet (500 mg total) by mouth 2 (two) times daily with a meal., Disp: 60 tablet, Rfl: 0 .  metoprolol succinate (TOPROL-XL) 50 MG 24 hr tablet, Take 50 mg by mouth daily. Take with or immediately following a meal., Disp: , Rfl:  .  Multiple Vitamins-Minerals (DECUBI-VITE PO), Take 1 tablet by mouth daily., Disp: , Rfl:  .  oxyCODONE (OXYCONTIN) 40 mg 12 hr tablet, Take 1 tablet (40 mg total) by mouth every 12 (twelve) hours., Disp: 40 tablet, Rfl: 0 .  oxyCODONE-acetaminophen (ROXICET) 5-325 MG tablet, Take 1 tablet by mouth every 8 (eight) hours as needed for moderate pain or severe pain., Disp: 10 tablet, Rfl: 0 .  PARoxetine (PAXIL) 40 MG tablet, Take 40 mg by mouth every morning., Disp: , Rfl:  .  polyethylene glycol (MIRALAX / GLYCOLAX) packet, Take 17 g by mouth daily. Hold if diarrhea, Disp: 14 each, Rfl: 0 .  potassium chloride (K-DUR) 10 MEQ tablet, Take 2 tablets (20 mEq total) by mouth daily., Disp: 60 tablet, Rfl: 0 .  pregabalin (LYRICA) 75 MG capsule, Take 1 capsule (75 mg total) by mouth every 8 (eight) hours., Disp: 90 capsule, Rfl: 0 .  senna-docusate (SENOKOT-S) 8.6-50 MG tablet, Take 2 tablets by mouth at bedtime., Disp: 30 tablet, Rfl: 0 .  sodium chloride (OCEAN) 0.65 % SOLN nasal spray, Place 1 spray into both nostrils as needed for congestion., Disp: 1 Bottle, Rfl: 0 .  Sodium Chloride Flush (NORMAL SALINE FLUSH) 0.9 % SOLN, Inject 10 mLs into the vein 2 (two) times daily. Flush before and after antibiotic, Disp: , Rfl:  .  tamsulosin (FLOMAX) 0.4 MG CAPS capsule, Take 0.4 mg by mouth daily. , Disp: , Rfl:  .  tizanidine (ZANAFLEX) 2 MG capsule, Take 1 capsule (2 mg total) by mouth 3 (three) times daily as needed for muscle spasms., Disp: 30 capsule, Rfl: 0 .  traMADol (ULTRAM) 50 MG tablet, Take 1 tablet (50 mg total) by mouth every 6 (six) hours as needed for moderate pain., Disp: 15 tablet, Rfl: 0 .  vancomycin in sodium chloride  0.9 % 500 mL, Will need to continue iv vanc per pharmacy  through July 15th per SNF protocol Trough goal of 15-20 Twice weekly bmp, vancomycin trough  repeat ESR  in 2 weeks Report result to infectious disease, Disp: 1500 mg, Rfl: 0 .  VICTOZA 18 MG/3ML SOPN, Inject 1.2 mg into the skin 2 (two) times daily. , Disp: , Rfl:    Review of Systems  Constitutional: Positive for fatigue. Negative for fever, chills, diaphoresis, activity change, appetite change and unexpected weight change.  HENT: Negative for congestion, rhinorrhea, sinus pressure, sneezing, sore throat and trouble swallowing.   Eyes: Negative for photophobia and visual disturbance.  Respiratory: Negative for cough, chest tightness, shortness of breath, wheezing and stridor.   Cardiovascular: Negative for chest pain, palpitations and leg swelling.  Gastrointestinal: Negative for nausea, vomiting, abdominal pain, diarrhea, constipation, blood in stool, abdominal distention and anal bleeding.  Genitourinary: Negative for dysuria, hematuria, flank pain and difficulty urinating.  Musculoskeletal: Positive for myalgias. Negative for back pain, joint swelling, arthralgias and gait problem.  Skin: Positive for wound. Negative for color change, pallor and rash.  Neurological: Negative for dizziness, tremors, weakness and light-headedness.  Hematological: Negative for adenopathy. Does not bruise/bleed easily.  Psychiatric/Behavioral: Negative for behavioral problems, confusion, sleep disturbance, dysphoric mood, decreased concentration and agitation.       Objective:   Physical Exam  Constitutional: He is oriented to person, place, and time. He appears well-developed and well-nourished.  HENT:  Head: Normocephalic and atraumatic.  Eyes: Conjunctivae and EOM are normal.  Neck: Normal range of motion. Neck supple.  Cardiovascular: Normal rate and regular rhythm.   Pulmonary/Chest: Effort normal. No respiratory distress. He has no wheezes.   Abdominal: Soft. He exhibits no distension.  Musculoskeletal: Normal range of motion. He exhibits no edema or tenderness.  Neurological: He is alert and oriented to person, place, and time.  Skin: Skin is warm and dry. No rash noted. No erythema. There is pallor.  Psychiatric: He has a normal mood and affect. His speech is normal and behavior is normal.    Right foot with ulcers as below 12/27/15:    PICC line clean 12/27/15:          Assessment & Plan:   MRSA bacteremia with metastatic infection including large epidural abscesss sp Neurosurgery  --  I will extend his IV abx through end of July and then DC his picc and put him on chronic doxycycline  Hx of osteo of clavicle: will need to reassess this not just clinically but with imaging  Ulcers on foot: needs wound care and close followup  Hypotension: I suspect due to continuation of too many anti-HTNsives in setting of 50# weight loss. WOuld dc his ACE and diuretic and defer beta blocker to SNF MD and PCP  I spent greater than 40 minutes with the patient including greater than 50% of time in face to face counsel of the patient re his MRSA bacteremia with diskitis, osteomyelitis, osteo of the clavicle, ulcers on the foot, hypotension, weight loss and in coordination of her care.

## 2016-01-10 ENCOUNTER — Ambulatory Visit: Payer: Self-pay | Admitting: Infectious Disease

## 2016-01-10 LAB — BASIC METABOLIC PANEL
BUN: 31 mg/dL — AB (ref 4–21)
CREATININE: 1.7 mg/dL — AB (ref 0.6–1.3)
Glucose: 71 mg/dL
Potassium: 4.4 mmol/L (ref 3.4–5.3)
SODIUM: 143 mmol/L (ref 137–147)

## 2016-01-12 ENCOUNTER — Non-Acute Institutional Stay (SKILLED_NURSING_FACILITY): Payer: Medicaid Other | Admitting: Adult Health

## 2016-01-12 ENCOUNTER — Encounter: Payer: Self-pay | Admitting: Adult Health

## 2016-01-12 DIAGNOSIS — R339 Retention of urine, unspecified: Secondary | ICD-10-CM | POA: Diagnosis not present

## 2016-01-12 DIAGNOSIS — A4102 Sepsis due to Methicillin resistant Staphylococcus aureus: Secondary | ICD-10-CM | POA: Diagnosis not present

## 2016-01-12 DIAGNOSIS — I5032 Chronic diastolic (congestive) heart failure: Secondary | ICD-10-CM

## 2016-01-12 DIAGNOSIS — J449 Chronic obstructive pulmonary disease, unspecified: Secondary | ICD-10-CM | POA: Diagnosis not present

## 2016-01-12 DIAGNOSIS — I110 Hypertensive heart disease with heart failure: Secondary | ICD-10-CM | POA: Diagnosis not present

## 2016-01-12 DIAGNOSIS — M4802 Spinal stenosis, cervical region: Secondary | ICD-10-CM | POA: Diagnosis not present

## 2016-01-12 DIAGNOSIS — E1149 Type 2 diabetes mellitus with other diabetic neurological complication: Secondary | ICD-10-CM

## 2016-01-12 DIAGNOSIS — M4622 Osteomyelitis of vertebra, cervical region: Secondary | ICD-10-CM

## 2016-01-12 NOTE — Progress Notes (Addendum)
Patient ID: Gregory [name], male   DOB: Dec 02, 1960, 55 y.o.   MRN: [id number]    Location:   [place] Room Number: 156-A Place of Service:  SNF (31)   CODE STATUS: full code   No Known Allergies  Chief Complaint  Patient presents with  . Medical Management of Chronic Issues    Follow up    HPI:  He is a resident of this facility being seen for the management of his chronic illnesses. Overall there is little change in his status. He has completed is IV abt. He is not voicing any concerns or complaints a this time. There are no nursing concerns at this time.    Past Medical History:  Diagnosis Date  . Acute respiratory failure ([place])   . Anemia   . Chronic back pain   . COPD (chronic obstructive pulmonary disease) ([place])   . DDD (degenerative disc disease), lumbar   . Diabetes mellitus without complication ([place])   . Foot drop, right 12/27/2015  . Hypertension   . MRSA bacteremia   . Osteomyelitis ([place])   . Osteomyelitis ([place])   . Urinary retention   . Urinary retention     Past Surgical History:  Procedure Laterality Date  . ANTERIOR CERVICAL CORPECTOMY N/A 11/25/2015   Procedure: ANTERIOR CERVICAL FIVE CORPECTOMY Cervical four - six fusion;  Surgeon: Consuella Lose, MD;  Location: [place] NEURO ORS;  Service: Neurosurgery;  Laterality: N/A;  ANTERIOR CERVICAL FIVE CORPECTOMY Cervical four - six fusion  . POSTERIOR CERVICAL FUSION/FORAMINOTOMY N/A 11/29/2015   Procedure: Cervical Three-Cervical Seven Posterior Cervical Laminectomy with Fusion;  Surgeon: Consuella Lose, MD;  Location: [place] NEURO ORS;  Service: Neurosurgery;  Laterality: N/A;  Cervical Three-Cervical Seven Posterior Cervical Laminectomy with Fusion    Social History   Social History  . Marital status: Single    Spouse name: N/A  . Number of children: N/A  . Years of education: N/A   Occupational History  . Not on file.   Social History Main Topics  . Smoking status: Former Smoker   Packs/day: 3.00    Types: Cigarettes  . Smokeless tobacco: Never Used  . Alcohol use No  . Drug use: No  . Sexual activity: Not on file   Other Topics Concern  . Not on file   Social History Narrative  . No narrative on file   Family History  Problem Relation Age of Onset  . Cancer Other   . Diabetes Other       VITAL SIGNS BP 104/63   Pulse 76   Temp 97.9 F (36.6 C) (Oral)   Resp 18   Ht 6' (1.829 m)   Wt 196 lb (88.9 kg)   SpO2 95%   BMI 26.58 kg/m   Patient's Medications  New Prescriptions   No medications on file  Previous Medications   ALBUTEROL (PROVENTIL HFA;VENTOLIN HFA) 108 (90 BASE) MCG/ACT INHALER    Inhale 2 puffs into the lungs every 6 (six) hours as needed for wheezing or shortness of breath.   ALUM & MAG HYDROXIDE-SIMETH (MAALOX PLUS) 400-400-40 MG/5ML SUSPENSION    Take 30 mLs by mouth every 6 (six) hours as needed for indigestion.   AMINO ACIDS-PROTEIN HYDROLYS (FEEDING SUPPLEMENT, PRO-STAT SUGAR FREE 64,) LIQD    Take 30 mLs by mouth 3 (three) times daily with meals.   BISACODYL (DULCOLAX) 10 MG SUPPOSITORY    Place 1 suppository (10 mg total) rectally daily as needed for moderate constipation.  COLLAGENASE (SANTYL) OINTMENT    Apply topically daily.   DIAZEPAM (VALIUM) 5 MG TABLET    Take 1 tablet (5 mg total) by mouth every 8 (eight) hours as needed for anxiety or muscle spasms.   DOXYCYCLINE (VIBRAMYCIN) 100 MG CAPSULE    Take 100 mg by mouth 2 (two) times daily.   FEEDING SUPPLEMENT (BOOST / RESOURCE BREEZE) LIQD    Take 1 Container by mouth 3 (three) times daily between meals.   GLIMEPIRIDE (AMARYL) 2 MG TABLET    Take 2 mg by mouth daily with breakfast.   GLUCAGON, HUMAN RECOMBINANT, (GLUCAGEN) 1 MG INJECTION    Inject 1 mg into the vein once as needed for low blood sugar.   HEPARIN FLUSH 10 UNIT/ML SOLN INJECTION    Inject 5 mLs into the vein 2 (two) times daily.   INSULIN GLARGINE (LANTUS) 100 UNIT/ML INJECTION    Inject 0.1 mLs (10 Units  total) into the skin at bedtime.   INSULIN LISPRO (HUMALOG) 100 UNIT/ML INJECTION    Inject 0-5 Units into the skin 3 (three) times daily before meals. If 0-149=0 units, 150-600=5 units   MAGNESIUM OXIDE (MAG-OX) 400 MG TABLET    Take 400 mg by mouth 2 (two) times daily.   METFORMIN (GLUCOPHAGE) 500 MG TABLET    Take 1 tablet (500 mg total) by mouth 2 (two) times daily with a meal.   METOPROLOL SUCCINATE (TOPROL-XL) 50 MG 24 HR TABLET    Take 50 mg by mouth daily. Take with or immediately following a meal.   MULTIPLE VITAMINS-MINERALS (DECUBI-VITE PO)    Take 1 tablet by mouth daily.   OMEPRAZOLE (PRILOSEC) 20 MG CAPSULE    Take 20 mg by mouth daily.   OXYCODONE (OXYCONTIN) 40 MG 12 HR TABLET    Take 1 tablet (40 mg total) by mouth every 12 (twelve) hours.   OXYCODONE-ACETAMINOPHEN (ROXICET) 5-325 MG TABLET    Take 1 tablet by mouth every 8 (eight) hours as needed for moderate pain or severe pain.   PAROXETINE (PAXIL) 40 MG TABLET    Take 40 mg by mouth every morning.   POLYETHYLENE GLYCOL (MIRALAX / GLYCOLAX) PACKET    Take 17 g by mouth daily. Hold if diarrhea   POTASSIUM CHLORIDE (K-DUR) 10 MEQ TABLET    Take 2 tablets (20 mEq total) by mouth daily.   PREGABALIN (LYRICA) 75 MG CAPSULE    Take 1 capsule (75 mg total) by mouth every 8 (eight) hours.   SENNA-DOCUSATE (SENOKOT-S) 8.6-50 MG TABLET    Take 2 tablets by mouth at bedtime.   SODIUM CHLORIDE (OCEAN) 0.65 % SOLN NASAL SPRAY    Place 1 spray into both nostrils as needed for congestion.   SODIUM CHLORIDE FLUSH (NORMAL SALINE FLUSH) 0.9 % SOLN    Inject 10 mLs into the vein 2 (two) times daily. Flush before and after antibiotic   TAMSULOSIN (FLOMAX) 0.4 MG CAPS CAPSULE    Take 0.4 mg by mouth daily.    TIZANIDINE (ZANAFLEX) 2 MG CAPSULE    Take 1 capsule (2 mg total) by mouth 3 (three) times daily as needed for muscle spasms.   TRAMADOL (ULTRAM) 50 MG TABLET    Take 1 tablet (50 mg total) by mouth every 6 (six) hours as needed for moderate  pain.   VICTOZA 18 MG/3ML SOPN    Inject 1.2 mg into the skin 2 (two) times daily.   Modified Medications   No medications on file  Discontinued  Medications   FUROSEMIDE (LASIX) 40 MG TABLET    Take 40 mg by mouth 2 (two) times daily.    LISINOPRIL (PRINIVIL,ZESTRIL) 10 MG TABLET    Take 10 mg by mouth daily.   VANCOMYCIN IN SODIUM CHLORIDE 0.9 % 500 ML    Will need to continue iv vanc per pharmacy  through July 15th per SNF protocol Trough goal of 15-20 Twice weekly bmp, vancomycin trough  repeat ESR  in 2 weeks Report result to infectious disease     SIGNIFICANT DIAGNOSTIC EXAMS  11-07-15: EEG: This EEG is indicative of sedated sleep.  No focal or epileptiform activity is seen  11-07-15: ct of head: 1. No acute intracranial pathology seen on CT. 2. Mild small vessel ischemic microangiopathy.   11-09-15: renal ultrasound: Normal kidneys bilaterally  11-10-15: ct of abdomen and pelvis: 1. No abscess identified in the abdomen or pelvis. 2. Inflammatory changes in the fat adjacent to the urinary bladder, compatible with the reported clinical history of urinary tract infection. 3. Probable vascular calcification versus tiny 2 mm nonobstructive calculus in the upper pole of the right kidney. 4. Normal appendix. 5. Splenomegaly. 6. Areas of atelectasis or resolving airspace consolidation in the lung bases bilaterally with trace right pleural effusion. 7. Atherosclerosis.  11-11-15: mri of thoracic and lumbar spine:  MRI THORACIC SPINE: No MR findings of infection within the thoracic spine. Moderate LEFT central T7-8 disc protrusion resulting in mild canal stenosis.   MRI LUMBAR SPINE: Grade 1 L4-5 anterolisthesis with severe L4-5 RIGHT facet arthropathy, inflammation versus infectious bone marrow changes and facet cyst versus abscess. Possible RIGHT psoas muscle myositis. Severe canal stenosis L3-4 and L4-5. Severe L4-5 and moderate to severe L3-4 neural foraminal  narrowing.  11-11-15: mri of cervical spine: C5-6 discitis osteomyelitis with trace epidural abscess at this level. Associated paraspinal muscle edema/myositis. Severe canal stenosis at C5-6. C4-5 through C6-7 spinal cord edema/pre syrinx. No abnormal enhancement of the spinal cord. Moderate canal stenosis at C6-7, mild at C3-4 and C4-5. Multilevel neural foraminal narrowing: Severe at C5-6 and C6-7.  11-20-15: 2-d echo; - Left ventricle: The cavity size was normal. There was mild concentric hypertrophy. Systolic function was normal. The estimated ejection fraction was in the range of 55% to 60%. Doppler parameters are consistent with abnormal left ventricular relaxation (grade 1 diastolic dysfunction). - Pericardium, extracardiac: A small pericardial effusion was identified anterior to the heart. There was no evidence of hemodynamic compromise.   11-26-15: chest x-ray: Low lung volumes with interstitial opacities, most likely atelectasis. No large pleural effusion or evidence of lobar pneumonia. Right upper extremity PICC. Since the prior plain film there has been removal of endotracheal tube and gastric tube.  11-28-15: cervical spine x-ray: Postoperative changes of C5 corpectomy with anterior plate and screw fixation of C4-C6. Note that C6 and below not well visualized secondary to overlying soft tissues. Compared to the intraoperative fluoroscopic image there is mild increased kyphosis. Significant prevertebral soft tissue swelling and gas, favored to be postoperative changes.    LABS REVIEWED:   11-29-15: wbc 7.2; hgb 8.3; hct 27.6; mcv 85.4; plt 238; glucose 110; bun 12; creat 0.86; k+ 3.7; na++ 137 12-01-15: wbc 9.3; hgb 8.3; hct 27.3 mcv 83.5; plt 290; glucose 150; bun 9; creat 0.80; k+ 3.5; na++ 136; sed rate 132 12-28-15: wbc 12.8; hgb 6.5; hct 20.5; mcv 84.0; plt 261; glucose 120; bun 40; creat 2.16; k+ 4.6; na++ 139; sed rate 115; crp 2.2 (repeat) hgb 9.0; hct  28.0 01-09-16: glucose 71; bun  31; creat 1.74; k+ 4.4; na++ 143    Review of Systems  Constitutional: Negative for malaise/fatigue.  Respiratory: Negative for cough and shortness of breath.   Cardiovascular: Negative for chest pain, palpitations and leg swelling.  Gastrointestinal: Negative for heartburn, abdominal pain and constipation.  Musculoskeletal: Negative for myalgias, back pain and joint pain.  Skin: Negative.   Neurological: Negative for dizziness.  Psychiatric/Behavioral: The patient is not nervous/anxious.     Physical Exam  Constitutional: He is oriented to person, place, and time. No distress.  Overweight   Eyes: Conjunctivae are normal.  Neck: Neck supple. No JVD present. No thyromegaly present.  Cardiovascular: Normal rate, regular rhythm and intact distal pulses.   Respiratory: Effort normal and breath sounds normal. No respiratory distress. He has no wheezes.  GI: Soft. Bowel sounds are normal. He exhibits no distension. There is no tenderness.  Musculoskeletal: He exhibits no edema.  Able to move all extremities  Lymphadenopathy:    He has no cervical adenopathy.  Neurological: He is alert and oriented to person, place, and time.  Skin: Skin is warm and dry. He is not diaphoretic.  Sacrum unstaged: 2 x 4 x 0.2 cm 60% slough: santyl Right great toe: 0.7 x 0.8 x 0.1 90% slough  Left second toe red with bruising on tip with blister    Psychiatric: He has a normal mood and affect.       ASSESSMENT/ PLAN:  1. Diabetes: will continue victoza 1.2 mg twice daily; lantus 10 units daily; amaryl 2 mg daily; metformin 500 mg twice daily humalog humalog 5 units for cbg >=150  2.  Chronic diastolic heart failure: is currently off lasix will not make changes; will continue toprol xl 50 mg daily will monitor    3. Hypertension: will continue  toprol xl 50 mg daily   4. COPD: will continue albuterol 2 puffs every 6 hours as needed  5. Constipation: will continue miralax daily; senna s 2 tabs daily    6. Urinary retention: will continue flomax 0.4 mg daily   7. 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 Severe cervical spinal stenosis: has chronic pain: will continue oxycontin 40 mg every 12 hours; percocet 5/325 mg every 8 hours as needed; will continue lyrica 75 mg three times daily and has valuim 5 mg every 8 hours as needed for spasms; has zanaflex 2 mg every 8 hours as needed also has ultram 50 mg every 6 hours as needed      Ok Edwards NP [facility]  Contact [phone] Monday through Friday 8am- 5pm  After hours call [phone]

## 2016-01-18 LAB — HEMOGLOBIN A1C: Hemoglobin A1C: 5.5

## 2016-02-02 ENCOUNTER — Non-Acute Institutional Stay (SKILLED_NURSING_FACILITY): Payer: Medicaid Other | Admitting: Adult Health

## 2016-02-02 DIAGNOSIS — I110 Hypertensive heart disease with heart failure: Secondary | ICD-10-CM | POA: Diagnosis not present

## 2016-02-02 DIAGNOSIS — E1149 Type 2 diabetes mellitus with other diabetic neurological complication: Secondary | ICD-10-CM

## 2016-02-16 ENCOUNTER — Non-Acute Institutional Stay (SKILLED_NURSING_FACILITY): Payer: Medicaid Other | Admitting: Adult Health

## 2016-02-16 ENCOUNTER — Encounter: Payer: Self-pay | Admitting: Adult Health

## 2016-02-16 DIAGNOSIS — I5032 Chronic diastolic (congestive) heart failure: Secondary | ICD-10-CM | POA: Diagnosis not present

## 2016-02-16 DIAGNOSIS — M4802 Spinal stenosis, cervical region: Secondary | ICD-10-CM | POA: Diagnosis not present

## 2016-02-16 DIAGNOSIS — E1149 Type 2 diabetes mellitus with other diabetic neurological complication: Secondary | ICD-10-CM | POA: Diagnosis not present

## 2016-02-16 DIAGNOSIS — J449 Chronic obstructive pulmonary disease, unspecified: Secondary | ICD-10-CM | POA: Diagnosis not present

## 2016-02-16 DIAGNOSIS — I110 Hypertensive heart disease with heart failure: Secondary | ICD-10-CM

## 2016-02-16 NOTE — Progress Notes (Signed)
Patient ID: Gregory [name], male   DOB: 1961/04/13, 55 y.o.   MRN: [id number]    Location:   [facility] Room Number: 156-A Place of Service:  SNF (31)   CODE STATUS: Full Code  No Known Allergies  Chief Complaint  Patient presents with  . Medical Management of Chronic Issues    Follow up    HPI:  He is a long term resident of this facility being seen for the management of his chronic illnesses. He tells me that he has worsening sinus congestion and that the saline nasal spray is not helping. He is not voicing any other complaints at this time. There are no nursing concerns at this time.   Past Medical History:  Diagnosis Date  . Acute respiratory failure ([place])   . Anemia   . Chronic back pain   . COPD (chronic obstructive pulmonary disease) ([place])   . DDD (degenerative disc disease), lumbar   . Diabetes mellitus without complication ([place])   . Foot drop, right 12/27/2015  . Hypertension   . MRSA bacteremia   . Osteomyelitis ([place])   . Osteomyelitis ([place])   . Urinary retention   . Urinary retention     Past Surgical History:  Procedure Laterality Date  . ANTERIOR CERVICAL CORPECTOMY N/A 11/25/2015   Procedure: ANTERIOR CERVICAL FIVE CORPECTOMY Cervical four - six fusion;  Surgeon: Consuella Lose, MD;  Location: [place] [facility];  Service: Neurosurgery;  Laterality: N/A;  ANTERIOR CERVICAL FIVE CORPECTOMY Cervical four - six fusion  . POSTERIOR CERVICAL FUSION/FORAMINOTOMY N/A 11/29/2015   Procedure: Cervical Three-Cervical Seven Posterior Cervical Laminectomy with Fusion;  Surgeon: Consuella Lose, MD;  Location: [place] [facility];  Service: Neurosurgery;  Laterality: N/A;  Cervical Three-Cervical Seven Posterior Cervical Laminectomy with Fusion    Social History   Social History  . Marital status: Single    Spouse name: N/A  . Number of children: N/A  . Years of education: N/A   Occupational History  . Not on file.   Social History Main Topics  . Smoking  status: Former Smoker    Packs/day: 3.00    Types: Cigarettes  . Smokeless tobacco: Never Used  . Alcohol use No  . Drug use: No  . Sexual activity: Not on file   Other Topics Concern  . Not on file   Social History Narrative  . No narrative on file   Family History  Problem Relation Age of Onset  . Cancer Other   . Diabetes Other       VITAL SIGNS BP 112/68   Pulse 72   Temp 97.4 F (36.3 C) (Oral)   Resp 18   Ht 6' (1.829 m)   Wt 195 lb (88.5 kg)   SpO2 95%   BMI 26.45 kg/m   Patient's Medications  New Prescriptions   No medications on file  Previous Medications   ALBUTEROL (PROVENTIL HFA;VENTOLIN HFA) 108 (90 BASE) MCG/ACT INHALER    Inhale 2 puffs into the lungs every 6 (six) hours as needed for wheezing or shortness of breath.   AMINO ACIDS-PROTEIN HYDROLYS (FEEDING SUPPLEMENT, PRO-STAT SUGAR FREE 64,) LIQD    Take 30 mLs by mouth 3 (three) times daily with meals.   BISACODYL (DULCOLAX) 10 MG SUPPOSITORY    Place 1 suppository (10 mg total) rectally daily as needed for moderate constipation.   CALCIUM CARBONATE ANTACID (TUMS PO)    Take 2 tablets by mouth every 8 (eight) hours as needed.  COLLAGENASE (SANTYL) OINTMENT    Apply topically daily.   DIAZEPAM (VALIUM) 5 MG TABLET    Take 1 tablet (5 mg total) by mouth every 8 (eight) hours as needed for anxiety or muscle spasms.   FEEDING SUPPLEMENT (BOOST / RESOURCE BREEZE) LIQD    Take 1 Container by mouth 3 (three) times daily between meals.   GLUCAGON, HUMAN RECOMBINANT, (GLUCAGEN) 1 MG INJECTION    Inject 1 mg into the vein once as needed for low blood sugar.   HEPARIN FLUSH 10 UNIT/ML SOLN INJECTION    Inject 5 mLs into the vein 2 (two) times daily.   INSULIN LISPRO (HUMALOG) 100 UNIT/ML INJECTION    Inject 0-5 Units into the skin 3 (three) times daily before meals. If 0-149=0 units, 150-600=5 units   KETOCONAZOLE (NIZORAL) 2 % SHAMPOO    Apply 1 application topically. Apply to face every day shift on tue and  Friday.   MAGNESIUM OXIDE (MAG-OX) 400 MG TABLET    Take 400 mg by mouth 2 (two) times daily.   METFORMIN (GLUCOPHAGE) 500 MG TABLET    Take 1 tablet (500 mg total) by mouth 2 (two) times daily with a meal.   METOPROLOL SUCCINATE (TOPROL-XL) 50 MG 24 HR TABLET    Take 50 mg by mouth daily. Take with or immediately following a meal.   MULTIPLE VITAMINS-MINERALS (DECUBI-VITE PO)    Take 1 tablet by mouth daily.   OMEPRAZOLE (PRILOSEC) 20 MG CAPSULE    Take 20 mg by mouth daily.   OXYCODONE (OXYCONTIN) 40 MG 12 HR TABLET    Take 1 tablet (40 mg total) by mouth every 12 (twelve) hours.   OXYCODONE-ACETAMINOPHEN (ROXICET) 5-325 MG TABLET    Take 1 tablet by mouth every 8 (eight) hours as needed for moderate pain or severe pain.   PAROXETINE (PAXIL) 40 MG TABLET    Take 40 mg by mouth every morning.   POLYETHYLENE GLYCOL (MIRALAX / GLYCOLAX) PACKET    Take 17 g by mouth daily. Hold if diarrhea   POTASSIUM CHLORIDE (K-DUR) 10 MEQ TABLET    Take 2 tablets (20 mEq total) by mouth daily.   PREGABALIN (LYRICA) 75 MG CAPSULE    Take 1 capsule (75 mg total) by mouth every 8 (eight) hours.   SENNA-DOCUSATE (SENOKOT-S) 8.6-50 MG TABLET    Take 2 tablets by mouth at bedtime.   SODIUM CHLORIDE (OCEAN) 0.65 % SOLN NASAL SPRAY    Place 1 spray into both nostrils as needed for congestion.   SODIUM CHLORIDE FLUSH (NORMAL SALINE FLUSH) 0.9 % SOLN    Inject 10 mLs into the vein 2 (two) times daily. Flush before and after antibiotic   TAMSULOSIN (FLOMAX) 0.4 MG CAPS CAPSULE    Take 0.4 mg by mouth daily.    TIZANIDINE (ZANAFLEX) 2 MG CAPSULE    Take 1 capsule (2 mg total) by mouth 3 (three) times daily as needed for muscle spasms.   TORSEMIDE (DEMADEX) 20 MG TABLET    Take 20 mg by mouth 2 (two) times daily.   TRAMADOL (ULTRAM) 50 MG TABLET    Take 1 tablet (50 mg total) by mouth every 6 (six) hours as needed for moderate pain.   TRIAMCINOLONE CREAM (KENALOG) 0.1 %    Apply 1 application topically 2 (two) times daily.    Modified Medications   No medications on file  Discontinued Medications   ALUM & MAG HYDROXIDE-SIMETH (MAALOX PLUS) 400-400-40 MG/5ML SUSPENSION    Take 30  mLs by mouth every 6 (six) hours as needed for indigestion.   GLIMEPIRIDE (AMARYL) 2 MG TABLET    Take 2 mg by mouth daily with breakfast.   INSULIN GLARGINE (LANTUS) 100 UNIT/ML INJECTION    Inject 0.1 mLs (10 Units total) into the skin at bedtime.   VICTOZA 18 MG/3ML SOPN    Inject 1.2 mg into the skin 2 (two) times daily.      SIGNIFICANT DIAGNOSTIC EXAMS  11-07-15: EEG: This EEG is indicative of sedated sleep.  No focal or epileptiform activity is seen  11-07-15: ct of head: 1. No acute intracranial pathology seen on CT. 2. Mild small vessel ischemic microangiopathy.   11-09-15: renal ultrasound: Normal kidneys bilaterally  11-10-15: ct of abdomen and pelvis: 1. No abscess identified in the abdomen or pelvis. 2. Inflammatory changes in the fat adjacent to the urinary bladder, compatible with the reported clinical history of urinary tract infection. 3. Probable vascular calcification versus tiny 2 mm nonobstructive calculus in the upper pole of the right kidney. 4. Normal appendix. 5. Splenomegaly. 6. Areas of atelectasis or resolving airspace consolidation in the lung bases bilaterally with trace right pleural effusion. 7. Atherosclerosis.  11-11-15: mri of thoracic and lumbar spine:  MRI THORACIC SPINE: No MR findings of infection within the thoracic spine. Moderate LEFT central T7-8 disc protrusion resulting in mild canal stenosis.   MRI LUMBAR SPINE: Grade 1 L4-5 anterolisthesis with severe L4-5 RIGHT facet arthropathy, inflammation versus infectious bone marrow changes and facet cyst versus abscess. Possible RIGHT psoas muscle myositis. Severe canal stenosis L3-4 and L4-5. Severe L4-5 and moderate to severe L3-4 neural foraminal narrowing.  11-11-15: mri of cervical spine: C5-6 discitis osteomyelitis with trace epidural  abscess at this level. Associated paraspinal muscle edema/myositis. Severe canal stenosis at C5-6. C4-5 through C6-7 spinal cord edema/pre syrinx. No abnormal enhancement of the spinal cord. Moderate canal stenosis at C6-7, mild at C3-4 and C4-5. Multilevel neural foraminal narrowing: Severe at C5-6 and C6-7.  11-20-15: 2-d echo; - Left ventricle: The cavity size was normal. There was mild concentric hypertrophy. Systolic function was normal. The estimated ejection fraction was in the range of 55% to 60%. Doppler parameters are consistent with abnormal left ventricular relaxation (grade 1 diastolic dysfunction). - Pericardium, extracardiac: A small pericardial effusion was identified anterior to the heart. There was no evidence of hemodynamic compromise.   11-26-15: chest x-ray: Low lung volumes with interstitial opacities, most likely atelectasis. No large pleural effusion or evidence of lobar pneumonia. Right upper extremity PICC. Since the prior plain film there has been removal of endotracheal tube and gastric tube.  11-28-15: cervical spine x-ray: Postoperative changes of C5 corpectomy with anterior plate and screw fixation of C4-C6. Note that C6 and below not well visualized secondary to overlying soft tissues. Compared to the intraoperative fluoroscopic image there is mild increased kyphosis. Significant prevertebral soft tissue swelling and gas, favored to be postoperative changes.    LABS REVIEWED:   11-29-15: wbc 7.2; hgb 8.3; hct 27.6; mcv 85.4; plt 238; glucose 110; bun 12; creat 0.86; k+ 3.7; na++ 137 12-01-15: wbc 9.3; hgb 8.3; hct 27.3 mcv 83.5; plt 290; glucose 150; bun 9; creat 0.80; k+ 3.5; na++ 136; sed rate 132 01-06-16: wbc 7.4; hgb 8.1; hct 25.6; mcv 83.9; plt 250; sed rate >130; CRP 2.8 01-09-16: glucose 71; bun 31; creat 1.74; k+ 4.4; na++ 143 01-17-16: hgb a1c 5.5; pre-albumin 22 02-10-16: glucose 110; bun 31; creat 1.81; k+ 3.9; na+_+ 137  Review of Systems  Constitutional:  Negative for malaise/fatigue.  Respiratory: Negative for cough and shortness of breath.   Cardiovascular: Negative for chest pain, palpitations and leg swelling.  Gastrointestinal: Negative for heartburn, abdominal pain and constipation.  Musculoskeletal: Negative for myalgias, back pain and joint pain.  Skin:has right great toe ulcer  Neurological: Negative for dizziness.  Psychiatric/Behavioral: The patient is not nervous/anxious.     Physical Exam  Constitutional: He is oriented to person, place, and time. No distress.  Overweight   Eyes: Conjunctivae are normal.  Neck: Neck supple. No JVD present. No thyromegaly present.  Cardiovascular: Normal rate, regular rhythm and intact distal pulses.   Respiratory: Effort normal and breath sounds normal. No respiratory distress. He has no wheezes.  GI: Soft. Bowel sounds are normal. He exhibits no distension. There is no tenderness.  Musculoskeletal: He exhibits no edema.  Able to move all extremities  Has neck collar in place  Lymphadenopathy:    He has no cervical adenopathy.  Neurological: He is alert and oriented to person, place, and time.  Skin: Skin is warm and dry. He is not diaphoretic.  picc line in place  Right great toe: 0.3 x 0.3 x 0.1 cm dry dressing Right second toe: DTI: 0.9 x 3.8 cm skin prep  Psychiatric: He has a normal mood and affect.     ASSESSMENT/ PLAN:   1. Diabetes: will continue  metformin 500 mg twice daily humalog humalog 5 units for cbg >=150 hgb a1c 5.5   2.  Chronic diastolic heart failure: is currently off lasix will not make changes; will continue toprol xl 50 mg daily will monitor    3. Hypertension: will continue  toprol xl 50 mg daily   4. COPD: will continue albuterol 2 puffs every 6 hours as needed  Will begin flonase twice daily   5. Constipation: will continue miralax daily; senna s 2 tabs daily   6. Urinary retention: will continue flomax 0.4 mg daily   7. Severe cervical spinal  stenosis: has chronic pain: will continue oxycontin 40 mg every 12 hours; percocet 5/325 mg every 8 hours as needed; will continue lyrica 75 mg three times daily and has valuim 5 mg every 8 hours as needed for spasms; has zanaflex 2 mg every 8 hours as needed also has ultram 50 mg every 6 hours as needed        MD is aware of resident's narcotic use and is in agreement with current plan of care. We will attempt to wean resident as apropriate   Ok Edwards NP [facility]  Contact [phone] Monday through Friday 8am- 5pm  After hours call [phone]

## 2016-02-27 ENCOUNTER — Ambulatory Visit: Payer: Self-pay | Admitting: Infectious Disease

## 2016-03-05 ENCOUNTER — Encounter: Payer: Self-pay | Admitting: Adult Health

## 2016-03-05 NOTE — Progress Notes (Signed)
Location:   [facility] of Service:  SNF (31)   CODE STATUS: full code   No Known Allergies  Chief Complaint  Patient presents with  . Acute Visit    diabetes     HPI:  His cbg readings are low he states he has been feeling shaky at times. His blood pressure reading have been low as well.   Past Medical History:  Diagnosis Date  . Acute respiratory failure ([place])   . Anemia   . Chronic back pain   . COPD (chronic obstructive pulmonary disease) ([place])   . DDD (degenerative disc disease), lumbar   . Diabetes mellitus without complication ([place])   . Foot drop, right 12/27/2015  . Hypertension   . MRSA bacteremia   . Osteomyelitis ([place])   . Osteomyelitis ([employer])   . Urinary retention   . Urinary retention     Past Surgical History:  Procedure Laterality Date  . ANTERIOR CERVICAL CORPECTOMY N/A 11/25/2015   Procedure: ANTERIOR CERVICAL FIVE CORPECTOMY Cervical four - six fusion;  Surgeon: Consuella Lose, MD;  Location: [place] [facility];  Service: Neurosurgery;  Laterality: N/A;  ANTERIOR CERVICAL FIVE CORPECTOMY Cervical four - six fusion  . POSTERIOR CERVICAL FUSION/FORAMINOTOMY N/A 11/29/2015   Procedure: Cervical Three-Cervical Seven Posterior Cervical Laminectomy with Fusion;  Surgeon: Consuella Lose, MD;  Location: [place] [facility];  Service: Neurosurgery;  Laterality: N/A;  Cervical Three-Cervical Seven Posterior Cervical Laminectomy with Fusion    Social History   Social History  . Marital status: Single    Spouse name: N/A  . Number of children: N/A  . Years of education: N/A   Occupational History  . Not on file.   Social History Main Topics  . Smoking status: Former Smoker    Packs/day: 3.00    Types: Cigarettes  . Smokeless tobacco: Never Used  . Alcohol use No  . Drug use: No  . Sexual activity: Not on file   Other Topics Concern  . Not on file   Social History Narrative  . No narrative on file   Family History  Problem Relation Age of  Onset  . Cancer Other   . Diabetes Other    B/p: 98/60; P 77; wt: 195lb; 72i   Patient's Medications  New Prescriptions   No medications on file  Previous Medications   ALBUTEROL (PROVENTIL HFA;VENTOLIN HFA) 108 (90 BASE) MCG/ACT INHALER    Inhale 2 puffs into the lungs every 6 (six) hours as needed for wheezing or shortness of breath.   AMINO ACIDS-PROTEIN HYDROLYS (FEEDING SUPPLEMENT, PRO-STAT SUGAR FREE 64,) LIQD    Take 30 mLs by mouth 3 (three) times daily with meals.   BISACODYL (DULCOLAX) 10 MG SUPPOSITORY    Place 1 suppository (10 mg total) rectally daily as needed for moderate constipation.   CALCIUM CARBONATE ANTACID (TUMS PO)    Take 2 tablets by mouth every 8 (eight) hours as needed.   COLLAGENASE (SANTYL) OINTMENT    Apply topically daily.   DIAZEPAM (VALIUM) 5 MG TABLET    Take 1 tablet (5 mg total) by mouth every 8 (eight) hours as needed for anxiety or muscle spasms.   FEEDING SUPPLEMENT (BOOST / RESOURCE BREEZE) LIQD    Take 1 Container by mouth 3 (three) times daily between meals.   GLUCAGON, HUMAN RECOMBINANT, (GLUCAGEN) 1 MG INJECTION    Inject 1 mg into the vein once as needed for low blood sugar.   HEPARIN FLUSH  10 UNIT/ML SOLN INJECTION    Inject 5 mLs into the vein 2 (two) times daily.   INSULIN LISPRO (HUMALOG) 100 UNIT/ML INJECTION    Inject 0-5 Units into the skin 3 (three) times daily before meals. If 0-149=0 units, 150-600=5 units   KETOCONAZOLE (NIZORAL) 2 % SHAMPOO    Apply 1 application topically. Apply to face every day shift on tue and Friday.   MAGNESIUM OXIDE (MAG-OX) 400 MG TABLET    Take 400 mg by mouth 2 (two) times daily.   METFORMIN (GLUCOPHAGE) 500 MG TABLET    Take 1 tablet (500 mg total) by mouth 2 (two) times daily with a meal.   METOPROLOL SUCCINATE (TOPROL-XL) 50 MG 24 HR TABLET    Take 50 mg by mouth daily. Take with or immediately following a meal.   MULTIPLE VITAMINS-MINERALS (DECUBI-VITE PO)    Take 1 tablet by mouth daily.   OMEPRAZOLE  (PRILOSEC) 20 MG CAPSULE    Take 20 mg by mouth daily.   OXYCODONE (OXYCONTIN) 40 MG 12 HR TABLET    Take 1 tablet (40 mg total) by mouth every 12 (twelve) hours.   OXYCODONE-ACETAMINOPHEN (ROXICET) 5-325 MG TABLET    Take 1 tablet by mouth every 8 (eight) hours as needed for moderate pain or severe pain.   PAROXETINE (PAXIL) 40 MG TABLET    Take 40 mg by mouth every morning.   POLYETHYLENE GLYCOL (MIRALAX / GLYCOLAX) PACKET    Take 17 g by mouth daily. Hold if diarrhea   POTASSIUM CHLORIDE (K-DUR) 10 MEQ TABLET    Take 2 tablets (20 mEq total) by mouth daily.   PREGABALIN (LYRICA) 75 MG CAPSULE    Take 1 capsule (75 mg total) by mouth every 8 (eight) hours.   SENNA-DOCUSATE (SENOKOT-S) 8.6-50 MG TABLET    Take 2 tablets by mouth at bedtime.   SODIUM CHLORIDE (OCEAN) 0.65 % SOLN NASAL SPRAY    Place 1 spray into both nostrils as needed for congestion.   SODIUM CHLORIDE FLUSH (NORMAL SALINE FLUSH) 0.9 % SOLN    Inject 10 mLs into the vein 2 (two) times daily. Flush before and after antibiotic   TAMSULOSIN (FLOMAX) 0.4 MG CAPS CAPSULE    Take 0.4 mg by mouth daily.    TIZANIDINE (ZANAFLEX) 2 MG CAPSULE    Take 1 capsule (2 mg total) by mouth 3 (three) times daily as needed for muscle spasms.   TORSEMIDE (DEMADEX) 20 MG TABLET    Take 20 mg by mouth 2 (two) times daily.   TRAMADOL (ULTRAM) 50 MG TABLET    Take 1 tablet (50 mg total) by mouth every 6 (six) hours as needed for moderate pain.   TRIAMCINOLONE CREAM (KENALOG) 0.1 %    Apply 1 application topically 2 (two) times daily.  Modified Medications   No medications on file  Discontinued Medications   No medications on file     SIGNIFICANT DIAGNOSTIC EXAMS   11-07-15: EEG: This EEG is indicative of sedated sleep.  No focal or epileptiform activity is seen  11-07-15: ct of head: 1. No acute intracranial pathology seen on CT. 2. Mild small vessel ischemic microangiopathy.   11-09-15: renal ultrasound: Normal kidneys bilaterally  11-10-15: ct  of abdomen and pelvis: 1. No abscess identified in the abdomen or pelvis. 2. Inflammatory changes in the fat adjacent to the urinary bladder, compatible with the reported clinical history of urinary tract infection. 3. Probable vascular calcification versus tiny 2 mm nonobstructive calculus in  the upper pole of the right kidney. 4. Normal appendix. 5. Splenomegaly. 6. Areas of atelectasis or resolving airspace consolidation in the lung bases bilaterally with trace right pleural effusion. 7. Atherosclerosis.  11-11-15: mri of thoracic and lumbar spine:  MRI THORACIC SPINE: No MR findings of infection within the thoracic spine. Moderate LEFT central T7-8 disc protrusion resulting in mild canal stenosis.   MRI LUMBAR SPINE: Grade 1 L4-5 anterolisthesis with severe L4-5 RIGHT facet arthropathy, inflammation versus infectious bone marrow changes and facet cyst versus abscess. Possible RIGHT psoas muscle myositis. Severe canal stenosis L3-4 and L4-5. Severe L4-5 and moderate to severe L3-4 neural foraminal narrowing.  11-11-15: mri of cervical spine: C5-6 discitis osteomyelitis with trace epidural abscess at this level. Associated paraspinal muscle edema/myositis. Severe canal stenosis at C5-6. C4-5 through C6-7 spinal cord edema/pre syrinx. No abnormal enhancement of the spinal cord. Moderate canal stenosis at C6-7, mild at C3-4 and C4-5. Multilevel neural foraminal narrowing: Severe at C5-6 and C6-7.  11-20-15: 2-d echo; - Left ventricle: The cavity size was normal. There was mild concentric hypertrophy. Systolic function was normal. The estimated ejection fraction was in the range of 55% to 60%. Doppler parameters are consistent with abnormal left ventricular relaxation (grade 1 diastolic dysfunction). - Pericardium, extracardiac: A small pericardial effusion was identified anterior to the heart. There was no evidence of hemodynamic compromise.   11-26-15: chest x-ray: Low lung volumes with  interstitial opacities, most likely atelectasis. No large pleural effusion or evidence of lobar pneumonia. Right upper extremity PICC. Since the prior plain film there has been removal of endotracheal tube and gastric tube.  11-28-15: cervical spine x-ray: Postoperative changes of C5 corpectomy with anterior plate and screw fixation of C4-C6. Note that C6 and below not well visualized secondary to overlying soft tissues. Compared to the intraoperative fluoroscopic image there is mild increased kyphosis. Significant prevertebral soft tissue swelling and gas, favored to be postoperative changes.    LABS REVIEWED:   11-29-15: wbc 7.2; hgb 8.3; hct 27.6; mcv 85.4; plt 238; glucose 110; bun 12; creat 0.86; k+ 3.7; na++ 137 12-01-15: wbc 9.3; hgb 8.3; hct 27.3 mcv 83.5; plt 290; glucose 150; bun 9; creat 0.80; k+ 3.5; na++ 136; sed rate 132   Review of Systems  Constitutional: Negative for malaise/fatigue.  Respiratory: Negative for cough and shortness of breath.   Cardiovascular: Negative for chest pain, palpitations and leg swelling.  Gastrointestinal: Negative for heartburn, abdominal pain and constipation.  Musculoskeletal: Negative for myalgias, back pain and joint pain.  Skin:has right great toe ulcer  Neurological: Negative for dizziness.  Psychiatric/Behavioral: The patient is not nervous/anxious.     Physical Exam  Constitutional: He is oriented to person, place, and time. No distress.  Overweight   Eyes: Conjunctivae are normal.  Neck: Neck supple. No JVD present. No thyromegaly present.  Cardiovascular: Normal rate, regular rhythm and intact distal pulses.   Respiratory: Effort normal and breath sounds normal. No respiratory distress. He has no wheezes.  GI: Soft. Bowel sounds are normal. He exhibits no distension. There is no tenderness.  Musculoskeletal: He exhibits no edema.  Able to move all extremities Lymphadenopathy:    He has no cervical adenopathy.  Neurological: He  is alert and oriented to person, place, and time.  Skin: Skin is warm and dry. He is not diaphoretic.  picc line in place  Right great toe: 0.7 x 0.1 cm scab   Psychiatric: He has a normal mood and affect.  ASSESSMENT/ PLAN:  1. Diabetes: due to his low cbg readings: will stop the amaryl lantus; victoza will continue to monitor his status and will make further adjustments as indicated  2. Hypertension: will lower toprol xl to 25 mg daily     MD is aware of resident's narcotic use and is in agreement with current plan of care. We will attempt to wean resident as apropriate   Ok Edwards NP [facility]  Contact [phone] Monday through Friday 8am- 5pm  After hours call [phone]

## 2016-03-15 ENCOUNTER — Non-Acute Institutional Stay (SKILLED_NURSING_FACILITY): Payer: Medicaid Other | Admitting: Adult Health

## 2016-03-15 ENCOUNTER — Encounter: Payer: Self-pay | Admitting: Adult Health

## 2016-03-15 DIAGNOSIS — J449 Chronic obstructive pulmonary disease, unspecified: Secondary | ICD-10-CM

## 2016-03-15 DIAGNOSIS — R339 Retention of urine, unspecified: Secondary | ICD-10-CM

## 2016-03-15 DIAGNOSIS — I5032 Chronic diastolic (congestive) heart failure: Secondary | ICD-10-CM

## 2016-03-15 DIAGNOSIS — I110 Hypertensive heart disease with heart failure: Secondary | ICD-10-CM

## 2016-03-15 DIAGNOSIS — E1149 Type 2 diabetes mellitus with other diabetic neurological complication: Secondary | ICD-10-CM

## 2016-03-15 DIAGNOSIS — M4802 Spinal stenosis, cervical region: Secondary | ICD-10-CM | POA: Diagnosis not present

## 2016-03-15 NOTE — Progress Notes (Signed)
Patient ID: Gregory [name], male   DOB: 01-01-1961, 55 y.o.   MRN: [id number]   Location:   [facility] Room Number: 156-A Place of Service:  SNF (31)   CODE STATUS: Full Code  No Known Allergies  Chief Complaint  Patient presents with  . Medical Management of Chronic Issues    Follow up    HPI:  He is a long term resident of this facility being seen for the management of his chronic illnesses. Overall his status is stable. His urine culture has returned with p. aeruginosa and will require treatment. He tells me that he is feeling ok and has no complaints. He does understand that he will be on abt for his bladder infection. There are no nursing concerns at this time.   Past Medical History:  Diagnosis Date  . Acute respiratory failure ([place])   . Anemia   . Chronic back pain   . COPD (chronic obstructive pulmonary disease) ([place])   . DDD (degenerative disc disease), lumbar   . Diabetes mellitus without complication ([place])   . Foot drop, right 12/27/2015  . Hypertension   . MRSA bacteremia   . Osteomyelitis ([place])   . Osteomyelitis ([place])   . Urinary retention   . Urinary retention     Past Surgical History:  Procedure Laterality Date  . ANTERIOR CERVICAL CORPECTOMY N/A 11/25/2015   Procedure: ANTERIOR CERVICAL FIVE CORPECTOMY Cervical four - six fusion;  Surgeon: Consuella Lose, MD;  Location: [facility];  Service: Neurosurgery;  Laterality: N/A;  ANTERIOR CERVICAL FIVE CORPECTOMY Cervical four - six fusion  . POSTERIOR CERVICAL FUSION/FORAMINOTOMY N/A 11/29/2015   Procedure: Cervical Three-Cervical Seven Posterior Cervical Laminectomy with Fusion;  Surgeon: Consuella Lose, MD;  Location: [facility];  Service: Neurosurgery;  Laterality: N/A;  Cervical Three-Cervical Seven Posterior Cervical Laminectomy with Fusion    Social History   Social History  . Marital status: Single    Spouse name: N/A  . Number of children: N/A  . Years of education: N/A    Occupational History  . Not on file.   Social History Main Topics  . Smoking status: Former Smoker    Packs/day: 3.00    Types: Cigarettes  . Smokeless tobacco: Never Used  . Alcohol use No  . Drug use: No  . Sexual activity: Not on file   Other Topics Concern  . Not on file   Social History Narrative  . No narrative on file   Family History  Problem Relation Age of Onset  . Cancer Other   . Diabetes Other       VITAL SIGNS BP 110/67   Pulse 77   Temp (!) 100.7 F (38.2 C) (Oral)   Resp 17   Ht 6' (1.829 m)   Wt 212 lb 6 oz (96.3 kg)   SpO2 96%   BMI 28.80 kg/m   Patient's Medications  New Prescriptions   No medications on file  Previous Medications   ALBUTEROL (PROVENTIL HFA;VENTOLIN HFA) 108 (90 BASE) MCG/ACT INHALER    Inhale 2 puffs into the lungs every 6 (six) hours as needed for wheezing or shortness of breath.   AMINO ACIDS-PROTEIN HYDROLYS (FEEDING SUPPLEMENT, PRO-STAT SUGAR FREE 64,) LIQD    Take 30 mLs by mouth 3 (three) times daily with meals.   BISACODYL (DULCOLAX) 10 MG SUPPOSITORY    Place 1 suppository (10 mg total) rectally daily as needed for moderate constipation.   CALCIUM CARBONATE ANTACID (TUMS PO)  Take 2 tablets by mouth every 8 (eight) hours as needed.   DIAZEPAM (VALIUM) 5 MG TABLET    Take 1 tablet (5 mg total) by mouth every 8 (eight) hours as needed for anxiety or muscle spasms.   FLUTICASONE (FLONASE) 50 MCG/ACT NASAL SPRAY    Place 1 spray into both nostrils 2 (two) times daily.   GLUCAGON, HUMAN RECOMBINANT, (GLUCAGEN) 1 MG INJECTION    Inject 1 mg into the vein once as needed for low blood sugar.   GUAIFENESIN (MUCINEX) 600 MG 12 HR TABLET    Take 600 mg by mouth 2 (two) times daily.   HEPARIN FLUSH 10 UNIT/ML SOLN INJECTION    Inject 5 mLs into the vein 2 (two) times daily.   INSULIN LISPRO (HUMALOG) 100 UNIT/ML INJECTION    Inject 0-5 Units into the skin 3 (three) times daily before meals. If 0-149=0 units, 150-600=5 units    KETOCONAZOLE (NIZORAL) 2 % SHAMPOO    Apply 1 application topically. Apply to face every day shift on tue and Friday.   MAGNESIUM OXIDE (MAG-OX) 400 MG TABLET    Take 400 mg by mouth 2 (two) times daily.   METFORMIN (GLUCOPHAGE) 500 MG TABLET    Take 1 tablet (500 mg total) by mouth 2 (two) times daily with a meal.   METOPROLOL SUCCINATE (TOPROL-XL) 50 MG 24 HR TABLET    Take 50 mg by mouth daily. Take with or immediately following a meal.   MULTIPLE VITAMINS-MINERALS (DECUBI-VITE PO)    Take 1 tablet by mouth daily.   OMEPRAZOLE (PRILOSEC) 20 MG CAPSULE    Take 20 mg by mouth daily.   OXYCODONE (OXYCONTIN) 40 MG 12 HR TABLET    Take 1 tablet (40 mg total) by mouth every 12 (twelve) hours.   OXYCODONE-ACETAMINOPHEN (ROXICET) 5-325 MG TABLET    Take 1 tablet by mouth every 8 (eight) hours as needed for moderate pain or severe pain.   PAROXETINE (PAXIL) 40 MG TABLET    Take 40 mg by mouth every morning.   POLYETHYLENE GLYCOL (MIRALAX / GLYCOLAX) PACKET    Take 17 g by mouth daily. Hold if diarrhea   POTASSIUM CHLORIDE (K-DUR) 10 MEQ TABLET    Take 2 tablets (20 mEq total) by mouth daily.   PREGABALIN (LYRICA) 75 MG CAPSULE    Take 1 capsule (75 mg total) by mouth every 8 (eight) hours.   SENNA-DOCUSATE (SENOKOT-S) 8.6-50 MG TABLET    Take 2 tablets by mouth at bedtime.   SODIUM CHLORIDE (OCEAN) 0.65 % SOLN NASAL SPRAY    Place 1 spray into both nostrils as needed for congestion.   SODIUM CHLORIDE FLUSH (NORMAL SALINE FLUSH) 0.9 % SOLN    Inject 10 mLs into the vein 2 (two) times daily. Flush before and after antibiotic   TAMSULOSIN (FLOMAX) 0.4 MG CAPS CAPSULE    Take 0.4 mg by mouth daily.    TIZANIDINE (ZANAFLEX) 2 MG CAPSULE    Take 1 capsule (2 mg total) by mouth 3 (three) times daily as needed for muscle spasms.   TORSEMIDE (DEMADEX) 20 MG TABLET    Take 20 mg by mouth 2 (two) times daily.   TRAMADOL (ULTRAM) 50 MG TABLET    Take 1 tablet (50 mg total) by mouth every 6 (six) hours as needed for  moderate pain.   TRIAMCINOLONE CREAM (KENALOG) 0.1 %    Apply 1 application topically 2 (two) times daily.  Modified Medications   No medications on  file  Discontinued Medications   COLLAGENASE (SANTYL) OINTMENT    Apply topically daily.   FEEDING SUPPLEMENT (BOOST / RESOURCE BREEZE) LIQD    Take 1 Container by mouth 3 (three) times daily between meals.     SIGNIFICANT DIAGNOSTIC EXAMS  11-07-15: EEG: This EEG is indicative of sedated sleep.  No focal or epileptiform activity is seen  11-07-15: ct of head: 1. No acute intracranial pathology seen on CT. 2. Mild small vessel ischemic microangiopathy.   11-09-15: renal ultrasound: Normal kidneys bilaterally  11-10-15: ct of abdomen and pelvis: 1. No abscess identified in the abdomen or pelvis. 2. Inflammatory changes in the fat adjacent to the urinary bladder, compatible with the reported clinical history of urinary tract infection. 3. Probable vascular calcification versus tiny 2 mm nonobstructive calculus in the upper pole of the right kidney. 4. Normal appendix. 5. Splenomegaly. 6. Areas of atelectasis or resolving airspace consolidation in the lung bases bilaterally with trace right pleural effusion. 7. Atherosclerosis.  11-11-15: mri of thoracic and lumbar spine:  MRI THORACIC SPINE: No MR findings of infection within the thoracic spine. Moderate LEFT central T7-8 disc protrusion resulting in mild canal stenosis.   MRI LUMBAR SPINE: Grade 1 L4-5 anterolisthesis with severe L4-5 RIGHT facet arthropathy, inflammation versus infectious bone marrow changes and facet cyst versus abscess. Possible RIGHT psoas muscle myositis. Severe canal stenosis L3-4 and L4-5. Severe L4-5 and moderate to severe L3-4 neural foraminal narrowing.  11-11-15: mri of cervical spine: C5-6 discitis osteomyelitis with trace epidural abscess at this level. Associated paraspinal muscle edema/myositis. Severe canal stenosis at C5-6. C4-5 through C6-7 spinal cord  edema/pre syrinx. No abnormal enhancement of the spinal cord. Moderate canal stenosis at C6-7, mild at C3-4 and C4-5. Multilevel neural foraminal narrowing: Severe at C5-6 and C6-7.  11-20-15: 2-d echo; - Left ventricle: The cavity size was normal. There was mild concentric hypertrophy. Systolic function was normal. The estimated ejection fraction was in the range of 55% to 60%. Doppler parameters are consistent with abnormal left ventricular relaxation (grade 1 diastolic dysfunction). - Pericardium, extracardiac: A small pericardial effusion was identified anterior to the heart. There was no evidence of hemodynamic compromise.   11-26-15: chest x-ray: Low lung volumes with interstitial opacities, most likely atelectasis. No large pleural effusion or evidence of lobar pneumonia. Right upper extremity PICC. Since the prior plain film there has been removal of endotracheal tube and gastric tube.  11-28-15: cervical spine x-ray: Postoperative changes of C5 corpectomy with anterior plate and screw fixation of C4-C6. Note that C6 and below not well visualized secondary to overlying soft tissues. Compared to the intraoperative fluoroscopic image there is mild increased kyphosis. Significant prevertebral soft tissue swelling and gas, favored to be postoperative changes.  01-31-16: right lower extremity arterial doppler: mild atherosclerotic pvd with no hemodynamically significant stenosis and no occlusion.     LABS REVIEWED:   11-29-15: wbc 7.2; hgb 8.3; hct 27.6; mcv 85.4; plt 238; glucose 110; bun 12; creat 0.86; k+ 3.7; na++ 137 12-01-15: wbc 9.3; hgb 8.3; hct 27.3 mcv 83.5; plt 290; glucose 150; bun 9; creat 0.80; k+ 3.5; na++ 136; sed rate 132 01-06-16: wbc 7.4; hgb 8.1; hct 25.6; mcv 83.9; plt 250; sed rate >130; CRP 2.8 01-09-16: glucose 71; bun 31; creat 1.74; k+ 4.4; na++ 143 01-17-16: hgb a1c 5.5; pre-albumin 22 02-10-16: glucose 110; bun 31; creat 1.81; k+ 3.9; na++ 137 03-14-16: urine culture:  pseudomonas aeruginosa: cipro    Review of Systems  Constitutional:  Negative for malaise/fatigue.  Respiratory: Negative for cough and shortness of breath.   Cardiovascular: Negative for chest pain, palpitations and leg swelling.  Gastrointestinal: Negative for heartburn, abdominal pain and constipation.  Musculoskeletal: Negative for myalgias, back pain and joint pain.  Skin:has right great toe ulcer  Neurological: Negative for dizziness.  Psychiatric/Behavioral: The patient is not nervous/anxious.     Physical Exam  Constitutional: He is oriented to person, place, and time. No distress.  Overweight   Eyes: Conjunctivae are normal.  Neck: Neck supple. No JVD present. No thyromegaly present.  Cardiovascular: Normal rate, regular rhythm and intact distal pulses.   Respiratory: Effort normal and breath sounds normal. No respiratory distress. He has no wheezes.  GI: Soft. Bowel sounds are normal. He exhibits no distension. There is no tenderness.  Musculoskeletal: He exhibits no edema.  Able to move all extremities  Has neck collar in place  Lymphadenopathy:    He has no cervical adenopathy.  Neurological: He is alert and oriented to person, place, and time.  Skin: Skin is warm and dry. He is not diaphoretic.  picc line in place  Right great toe: 0.3 x 0.3 x 0.1 cm dry dressing Right second toe: DTI: 0.9 x 3.8 cm skin prep  Psychiatric: He has a normal mood and affect.     ASSESSMENT/ PLAN:   1. Diabetes: will continue  metformin 500 mg twice daily humalog humalog 5 units for cbg >=150 hgb a1c 5.5   2.  Chronic diastolic heart failure: is currently off lasix will not make changes; will continue toprol xl 50 mg daily will monitor    3. Hypertension: will continue  toprol xl 50 mg daily   4. COPD: will continue albuterol 2 puffs every 6 hours as needed  Will begin flonase twice daily   5. Constipation: will continue miralax daily; senna s 2 tabs daily   6. Urinary  retention: will continue flomax 0.4 mg daily   7. Severe cervical spinal stenosis: has chronic pain: will continue oxycontin 40 mg every 12 hours; percocet 5/325 mg every 8 hours as needed; will continue lyrica 75 mg three times daily and has valuim 5 mg every 8 hours as needed for spasms; has zanaflex 2 mg every 8 hours as needed also has ultram 50 mg every 6 hours as needed   8. UTI: will begin cipro 500 gm twice daily for 10 days with florastor twice daily      MD is aware of resident's narcotic use and is in agreement with current plan of care. We will attempt to wean resident as appropriate.     Ok Edwards NP [facility]  Contact [phone] Monday through Friday 8am- 5pm  After hours call [phone]

## 2016-03-16 ENCOUNTER — Other Ambulatory Visit: Payer: Self-pay

## 2016-03-16 MED ORDER — OXYCODONE-ACETAMINOPHEN 5-325 MG PO TABS: 1.0000 | ORAL_TABLET | Freq: Three times a day (TID) | ORAL | 0 refills | Status: DC | PRN

## 2016-03-16 NOTE — Telephone Encounter (Signed)
RX faxed to [facility] @ [phone], phone number [phone] 

## 2016-04-16 ENCOUNTER — Encounter: Payer: Self-pay | Admitting: Adult Health

## 2016-04-16 ENCOUNTER — Non-Acute Institutional Stay (SKILLED_NURSING_FACILITY): Payer: Medicaid Other | Admitting: Adult Health

## 2016-04-16 DIAGNOSIS — R339 Retention of urine, unspecified: Secondary | ICD-10-CM

## 2016-04-16 DIAGNOSIS — E1149 Type 2 diabetes mellitus with other diabetic neurological complication: Secondary | ICD-10-CM

## 2016-04-16 DIAGNOSIS — M4802 Spinal stenosis, cervical region: Secondary | ICD-10-CM | POA: Diagnosis not present

## 2016-04-16 DIAGNOSIS — I272 Pulmonary hypertension, unspecified: Secondary | ICD-10-CM | POA: Diagnosis not present

## 2016-04-16 DIAGNOSIS — I5032 Chronic diastolic (congestive) heart failure: Secondary | ICD-10-CM | POA: Diagnosis not present

## 2016-04-16 DIAGNOSIS — I110 Hypertensive heart disease with heart failure: Secondary | ICD-10-CM | POA: Diagnosis not present

## 2016-04-16 DIAGNOSIS — J449 Chronic obstructive pulmonary disease, unspecified: Secondary | ICD-10-CM | POA: Diagnosis not present

## 2016-04-16 DIAGNOSIS — K219 Gastro-esophageal reflux disease without esophagitis: Secondary | ICD-10-CM | POA: Diagnosis not present

## 2016-04-16 NOTE — Progress Notes (Signed)
Patient ID: Gregory [name], male   DOB: 1960/07/31, 55 y.o.   MRN: [id number]   Location:   [facility] Room Number: 156-A Place of Service:  SNF (31)   CODE STATUS: Full Code  No Known Allergies  Chief Complaint  Patient presents with  . Medical Management of Chronic Issues    Follow up    HPI:  He is a long term resident of this facility being seen for the management of his chronic illnesses. Overall his status is stable. He does spend nearly all of time in bed per his choice. He tells me that he is doing well. There are no nursing concerns at this time.    Past Medical History:  Diagnosis Date  . Acute respiratory failure ([place])   . Anemia   . Chronic back pain   . COPD (chronic obstructive pulmonary disease) ([place])   . DDD (degenerative disc disease), lumbar   . Diabetes mellitus without complication ([place])   . Foot drop, right 12/27/2015  . Hypertension   . MRSA bacteremia   . Osteomyelitis ([place])   . Osteomyelitis ([place])   . Urinary retention   . Urinary retention     Past Surgical History:  Procedure Laterality Date  . ANTERIOR CERVICAL CORPECTOMY N/A 11/25/2015   Procedure: ANTERIOR CERVICAL FIVE CORPECTOMY Cervical four - six fusion;  Surgeon: Consuella Lose, MD;  Location: [facility];  Service: Neurosurgery;  Laterality: N/A;  ANTERIOR CERVICAL FIVE CORPECTOMY Cervical four - six fusion  . POSTERIOR CERVICAL FUSION/FORAMINOTOMY N/A 11/29/2015   Procedure: Cervical Three-Cervical Seven Posterior Cervical Laminectomy with Fusion;  Surgeon: Consuella Lose, MD;  Location: [facility];  Service: Neurosurgery;  Laterality: N/A;  Cervical Three-Cervical Seven Posterior Cervical Laminectomy with Fusion    Social History   Social History  . Marital status: Single    Spouse name: N/A  . Number of children: N/A  . Years of education: N/A   Occupational History  . Not on file.   Social History Main Topics  . Smoking status: Former Smoker   Packs/day: 3.00    Types: Cigarettes  . Smokeless tobacco: Never Used  . Alcohol use No  . Drug use: No  . Sexual activity: Not on file   Other Topics Concern  . Not on file   Social History Narrative  . No narrative on file   Family History  Problem Relation Age of Onset  . Cancer Other   . Diabetes Other       VITAL SIGNS BP 109/68   Pulse 72   Temp 97.6 F (36.4 C) (Oral)   Ht 6' (1.829 m)   Wt 209 lb 2 oz (94.9 kg)   SpO2 94%   BMI 28.36 kg/m   Patient's Medications  New Prescriptions   No medications on file  Previous Medications   ALBUTEROL (PROVENTIL HFA;VENTOLIN HFA) 108 (90 BASE) MCG/ACT INHALER    Inhale 2 puffs into the lungs every 6 (six) hours as needed for wheezing or shortness of breath.   AMINO ACIDS-PROTEIN HYDROLYS (FEEDING SUPPLEMENT, PRO-STAT SUGAR FREE 64,) LIQD    Take 30 mLs by mouth 3 (three) times daily with meals.   BISACODYL (DULCOLAX) 10 MG SUPPOSITORY    Place 1 suppository (10 mg total) rectally daily as needed for moderate constipation.   CALCIUM CARBONATE ANTACID (TUMS PO)    Take 2 tablets by mouth every 8 (eight) hours as needed.   DIAZEPAM (VALIUM) 5 MG TABLET  Take 1 tablet (5 mg total) by mouth every 8 (eight) hours as needed for anxiety or muscle spasms.   FLUTICASONE (FLONASE) 50 MCG/ACT NASAL SPRAY    Place 1 spray into both nostrils 2 (two) times daily.   GLUCAGON, HUMAN RECOMBINANT, (GLUCAGEN) 1 MG INJECTION    Inject 1 mg into the vein once as needed for low blood sugar.   GUAIFENESIN (MUCINEX) 600 MG 12 HR TABLET    Take 600 mg by mouth 2 (two) times daily.   HEPARIN FLUSH 10 UNIT/ML SOLN INJECTION    Inject 5 mLs into the vein 2 (two) times daily.   INSULIN LISPRO (HUMALOG) 100 UNIT/ML INJECTION    Inject 0-5 Units into the skin 3 (three) times daily before meals. If 0-149=0 units, 150-600=5 units   KETOCONAZOLE (NIZORAL) 2 % SHAMPOO    Apply 1 application topically. Apply to face every day shift on tue and Friday.    MAGNESIUM OXIDE (MAG-OX) 400 MG TABLET    Take 400 mg by mouth 2 (two) times daily.   METFORMIN (GLUCOPHAGE) 500 MG TABLET    Take 1 tablet (500 mg total) by mouth 2 (two) times daily with a meal.   METOPROLOL SUCCINATE (TOPROL-XL) 50 MG 24 HR TABLET    Take 50 mg by mouth daily. Take with or immediately following a meal.   MULTIPLE VITAMINS-MINERALS (DECUBI-VITE PO)    Take 1 tablet by mouth daily.   OMEPRAZOLE (PRILOSEC) 20 MG CAPSULE    Take 20 mg by mouth daily.   OXYCODONE (OXYCONTIN) 40 MG 12 HR TABLET    Take 1 tablet (40 mg total) by mouth every 12 (twelve) hours.   OXYCODONE-ACETAMINOPHEN (ROXICET) 5-325 MG TABLET    Take 1 tablet by mouth every 8 (eight) hours as needed for moderate pain or severe pain. DO NOT EXCEED 3GM OF APAP IN 24 HOURS FROM ALL SOURCES   PAROXETINE (PAXIL) 40 MG TABLET    Take 40 mg by mouth every morning.   POLYETHYLENE GLYCOL (MIRALAX / GLYCOLAX) PACKET    Take 17 g by mouth daily. Hold if diarrhea   POTASSIUM CHLORIDE (K-DUR) 10 MEQ TABLET    Take 2 tablets (20 mEq total) by mouth daily.   PREGABALIN (LYRICA) 75 MG CAPSULE    Take 1 capsule (75 mg total) by mouth every 8 (eight) hours.   SENNA-DOCUSATE (SENOKOT-S) 8.6-50 MG TABLET    Take 2 tablets by mouth at bedtime.   SODIUM CHLORIDE (OCEAN) 0.65 % SOLN NASAL SPRAY    Place 1 spray into both nostrils as needed for congestion.   SODIUM CHLORIDE FLUSH (NORMAL SALINE FLUSH) 0.9 % SOLN    Inject 10 mLs into the vein 2 (two) times daily. Flush before and after antibiotic   TAMSULOSIN (FLOMAX) 0.4 MG CAPS CAPSULE    Take 0.4 mg by mouth daily.    TIZANIDINE (ZANAFLEX) 2 MG CAPSULE    Take 1 capsule (2 mg total) by mouth 3 (three) times daily as needed for muscle spasms.   TORSEMIDE (DEMADEX) 20 MG TABLET    Take 20 mg by mouth 2 (two) times daily.   TRAMADOL (ULTRAM) 50 MG TABLET    Take 1 tablet (50 mg total) by mouth every 6 (six) hours as needed for moderate pain.   TRIAMCINOLONE CREAM (KENALOG) 0.1 %    Apply 1  application topically 2 (two) times daily.  Modified Medications   No medications on file  Discontinued Medications   No medications on  file     SIGNIFICANT DIAGNOSTIC EXAMS  11-07-15: EEG: This EEG is indicative of sedated sleep.  No focal or epileptiform activity is seen  11-07-15: ct of head: 1. No acute intracranial pathology seen on CT. 2. Mild small vessel ischemic microangiopathy.   11-09-15: renal ultrasound: Normal kidneys bilaterally  11-10-15: ct of abdomen and pelvis: 1. No abscess identified in the abdomen or pelvis. 2. Inflammatory changes in the fat adjacent to the urinary bladder, compatible with the reported clinical history of urinary tract infection. 3. Probable vascular calcification versus tiny 2 mm nonobstructive calculus in the upper pole of the right kidney. 4. Normal appendix. 5. Splenomegaly. 6. Areas of atelectasis or resolving airspace consolidation in the lung bases bilaterally with trace right pleural effusion. 7. Atherosclerosis.  11-11-15: mri of thoracic and lumbar spine:  MRI THORACIC SPINE: No MR findings of infection within the thoracic spine. Moderate LEFT central T7-8 disc protrusion resulting in mild canal stenosis.   MRI LUMBAR SPINE: Grade 1 L4-5 anterolisthesis with severe L4-5 RIGHT facet arthropathy, inflammation versus infectious bone marrow changes and facet cyst versus abscess. Possible RIGHT psoas muscle myositis. Severe canal stenosis L3-4 and L4-5. Severe L4-5 and moderate to severe L3-4 neural foraminal narrowing.  11-11-15: mri of cervical spine: C5-6 discitis osteomyelitis with trace epidural abscess at this level. Associated paraspinal muscle edema/myositis. Severe canal stenosis at C5-6. C4-5 through C6-7 spinal cord edema/pre syrinx. No abnormal enhancement of the spinal cord. Moderate canal stenosis at C6-7, mild at C3-4 and C4-5. Multilevel neural foraminal narrowing: Severe at C5-6 and C6-7.  11-20-15: 2-d echo; - Left  ventricle: The cavity size was normal. There was mild concentric hypertrophy. Systolic function was normal. The estimated ejection fraction was in the range of 55% to 60%. Doppler parameters are consistent with abnormal left ventricular relaxation (grade 1 diastolic dysfunction). - Pericardium, extracardiac: A small pericardial effusion was identified anterior to the heart. There was no evidence of hemodynamic compromise.   11-26-15: chest x-ray: Low lung volumes with interstitial opacities, most likely atelectasis. No large pleural effusion or evidence of lobar pneumonia. Right upper extremity PICC. Since the prior plain film there has been removal of endotracheal tube and gastric tube.  11-28-15: cervical spine x-ray: Postoperative changes of C5 corpectomy with anterior plate and screw fixation of C4-C6. Note that C6 and below not well visualized secondary to overlying soft tissues. Compared to the intraoperative fluoroscopic image there is mild increased kyphosis. Significant prevertebral soft tissue swelling and gas, favored to be postoperative changes.  01-31-16: right lower extremity arterial doppler: mild atherosclerotic pvd with no hemodynamically significant stenosis and no occlusion.     LABS REVIEWED:   11-29-15: wbc 7.2; hgb 8.3; hct 27.6; mcv 85.4; plt 238; glucose 110; bun 12; creat 0.86; k+ 3.7; na++ 137 12-01-15: wbc 9.3; hgb 8.3; hct 27.3 mcv 83.5; plt 290; glucose 150; bun 9; creat 0.80; k+ 3.5; na++ 136; sed rate 132 01-06-16: wbc 7.4; hgb 8.1; hct 25.6; mcv 83.9; plt 250; sed rate >130; CRP 2.8 01-09-16: glucose 71; bun 31; creat 1.74; k+ 4.4; na++ 143 01-17-16: hgb a1c 5.5; pre-albumin 22 02-10-16: glucose 110; bun 31; creat 1.81; k+ 3.9; na++ 137 03-14-16: urine culture: pseudomonas aeruginosa: cipro    Review of Systems  Constitutional: Negative for malaise/fatigue.  Respiratory: Negative for cough and shortness of breath.   Cardiovascular: Negative for chest pain,  palpitations and leg swelling.  Gastrointestinal: Negative for heartburn, abdominal pain and constipation.  Musculoskeletal: Negative for myalgias,  back pain and joint pain.  Skin negative  Neurological: Negative for dizziness.  Psychiatric/Behavioral: The patient is not nervous/anxious.     Physical Exam  Constitutional: He is oriented to person, place, and time. No distress.  Overweight   Eyes: Conjunctivae are normal.  Neck: Neck supple. No JVD present. No thyromegaly present.  Cardiovascular: Normal rate, regular rhythm and intact distal pulses.   Respiratory: Effort normal and breath sounds normal. No respiratory distress. He has no wheezes.  GI: Soft. Bowel sounds are normal. He exhibits no distension. There is no tenderness.  Musculoskeletal: He exhibits no edema.  Able to move all extremities  Lymphadenopathy:    He has no cervical adenopathy.  Neurological: He is alert and oriented to person, place, and time.  Skin: Skin is warm and dry. He is not diaphoretic.  picc line in place  Psychiatric: He has a normal mood and affect.     ASSESSMENT/ PLAN:   1. Diabetes: will continue  metformin 500 mg twice daily humalog humalog 5 units for cbg >=150 hgb a1c 5.5   2.  Chronic diastolic heart failure: has pulmonary hypertension:  EF 55-60% (11-20-15): will continue demadex 20 mg twice daily with k+ 20 meq daily   3. Hypertension: will continue  toprol xl 50 mg daily   4. COPD: will continue albuterol 2 puffs every 6 hours as needed  flonase twice daily mucinex 600 mg twice daily   5. Constipation: will continue senna s 2 tabs daily   6. Urinary retention: will continue flomax 0.4 mg daily   7. Severe cervical spinal stenosis: has chronic pain: will continue oxycontin 40 mg every 12 hours; percocet 5/325 mg every 8 hours as needed; will continue lyrica 75 mg three times daily and has valuim 5 mg every 8 hours as needed for spasms; has zanaflex 2 mg every 8 hours as needed also  has ultram 50 mg every 6 hours as needed   8. Gerd: will continue prilosec 20 mg daily   9. Depression: will continue paxil 40 mg daily     Will check urine for micro-albumin; guaiac stool X3; and will place on foot and eye list    MD is aware of resident's narcotic use and is in agreement with current plan of care. We will attempt to wean resident as appropriate.     Ok Edwards NP [facility]  Contact [phone] Monday through Friday 8am- 5pm  After hours call [phone]

## 2016-05-11 DIAGNOSIS — K219 Gastro-esophageal reflux disease without esophagitis: Secondary | ICD-10-CM | POA: Insufficient documentation

## 2016-05-17 ENCOUNTER — Non-Acute Institutional Stay (SKILLED_NURSING_FACILITY): Payer: Medicaid Other | Admitting: Adult Health

## 2016-05-17 ENCOUNTER — Encounter: Payer: Self-pay | Admitting: Adult Health

## 2016-05-17 DIAGNOSIS — J449 Chronic obstructive pulmonary disease, unspecified: Secondary | ICD-10-CM

## 2016-05-17 DIAGNOSIS — I110 Hypertensive heart disease with heart failure: Secondary | ICD-10-CM | POA: Diagnosis not present

## 2016-05-17 DIAGNOSIS — E1149 Type 2 diabetes mellitus with other diabetic neurological complication: Secondary | ICD-10-CM | POA: Diagnosis not present

## 2016-05-17 DIAGNOSIS — I5032 Chronic diastolic (congestive) heart failure: Secondary | ICD-10-CM

## 2016-05-17 DIAGNOSIS — R339 Retention of urine, unspecified: Secondary | ICD-10-CM | POA: Diagnosis not present

## 2016-05-17 DIAGNOSIS — E1169 Type 2 diabetes mellitus with other specified complication: Secondary | ICD-10-CM

## 2016-05-17 DIAGNOSIS — M4802 Spinal stenosis, cervical region: Secondary | ICD-10-CM | POA: Diagnosis not present

## 2016-05-17 DIAGNOSIS — E785 Hyperlipidemia, unspecified: Secondary | ICD-10-CM | POA: Diagnosis not present

## 2016-05-17 NOTE — Progress Notes (Signed)
Patient ID: Gregory [name], male   DOB: 16-May-1961, 55 y.o.   MRN: [id number]   Location:   [facility] Room Number: 156-A Place of Service:  SNF (31)   CODE STATUS: Full Code  No Known Allergies  Chief Complaint  Patient presents with  . Medical Management of Chronic Issues    Follow up    HPI:  He is long term resident of this facility being seen for the management of his chronic illnesses. Overall there is little change in his status. He is not voicing any complaints. He does spend nearly all of his time in bed per his choice. There are no nursing concerns at this time.    Past Medical History:  Diagnosis Date  . Acute respiratory failure ([place])   . Anemia   . Chronic back pain   . COPD (chronic obstructive pulmonary disease) ([place])   . DDD (degenerative disc disease), lumbar   . Diabetes mellitus without complication ([place])   . Foot drop, right 12/27/2015  . Hypertension   . MRSA bacteremia   . Osteomyelitis ([place])   . Osteomyelitis ([place])   . Urinary retention   . Urinary retention     Past Surgical History:  Procedure Laterality Date  . ANTERIOR CERVICAL CORPECTOMY N/A 11/25/2015   Procedure: ANTERIOR CERVICAL FIVE CORPECTOMY Cervical four - six fusion;  Surgeon: Consuella Lose, MD;  Location: [facility];  Service: Neurosurgery;  Laterality: N/A;  ANTERIOR CERVICAL FIVE CORPECTOMY Cervical four - six fusion  . POSTERIOR CERVICAL FUSION/FORAMINOTOMY N/A 11/29/2015   Procedure: Cervical Three-Cervical Seven Posterior Cervical Laminectomy with Fusion;  Surgeon: Consuella Lose, MD;  Location: [facility];  Service: Neurosurgery;  Laterality: N/A;  Cervical Three-Cervical Seven Posterior Cervical Laminectomy with Fusion    Social History   Social History  . Marital status: Single    Spouse name: N/A  . Number of children: N/A  . Years of education: N/A   Occupational History  . Not on file.   Social History Main Topics  . Smoking status: Former  Smoker    Packs/day: 3.00    Types: Cigarettes  . Smokeless tobacco: Never Used  . Alcohol use No  . Drug use: No  . Sexual activity: Not on file   Other Topics Concern  . Not on file   Social History Narrative  . No narrative on file   Family History  Problem Relation Age of Onset  . Cancer Other   . Diabetes Other       VITAL SIGNS BP (!) 100/55   Pulse 60   Temp 97 F (36.1 C) (Oral)   Resp 18   Ht 6' (1.829 m)   Wt 214 lb 8 oz (97.3 kg)   SpO2 94%   BMI 29.09 kg/m   Patient's Medications  New Prescriptions   No medications on file  Previous Medications   ALBUTEROL (PROVENTIL HFA;VENTOLIN HFA) 108 (90 BASE) MCG/ACT INHALER    Inhale 2 puffs into the lungs every 6 (six) hours as needed for wheezing or shortness of breath.   AMINO ACIDS-PROTEIN HYDROLYS (FEEDING SUPPLEMENT, PRO-STAT SUGAR FREE 64,) LIQD    Take 30 mLs by mouth 3 (three) times daily with meals.   BISACODYL (DULCOLAX) 10 MG SUPPOSITORY    Place 1 suppository (10 mg total) rectally daily as needed for moderate constipation.   CALCIUM CARBONATE ANTACID (TUMS PO)    Take 2 tablets by mouth every 8 (eight) hours as needed.  DIAZEPAM (VALIUM) 5 MG TABLET    Take 1 tablet (5 mg total) by mouth every 8 (eight) hours as needed for anxiety or muscle spasms.   FLUTICASONE (FLONASE) 50 MCG/ACT NASAL SPRAY    Place 1 spray into both nostrils 2 (two) times daily.   GLUCAGON, HUMAN RECOMBINANT, (GLUCAGEN) 1 MG INJECTION    Inject 1 mg into the vein once as needed for low blood sugar.   INSULIN LISPRO (HUMALOG) 100 UNIT/ML INJECTION    Inject 0-5 Units into the skin 3 (three) times daily before meals. If 0-149=0 units, 150-600=5 units   KETOCONAZOLE (NIZORAL) 2 % SHAMPOO    Apply 1 application topically. Apply to face every day shift on tue and Friday.   MAGNESIUM OXIDE (MAG-OX) 400 MG TABLET    Take 400 mg by mouth 2 (two) times daily.   METFORMIN (GLUCOPHAGE) 500 MG TABLET    Take 1 tablet (500 mg total) by mouth 2  (two) times daily with a meal.   METOPROLOL TARTRATE (LOPRESSOR) 25 MG TABLET    Take 25 mg by mouth daily.   MULTIPLE VITAMINS-MINERALS (DECUBI-VITE PO)    Take 1 tablet by mouth daily.   OMEPRAZOLE (PRILOSEC) 20 MG CAPSULE    Take 20 mg by mouth daily.   OXYCODONE (OXYCONTIN) 40 MG 12 HR TABLET    Take 1 tablet (40 mg total) by mouth every 12 (twelve) hours.   OXYCODONE-ACETAMINOPHEN (ROXICET) 5-325 MG TABLET    Take 1 tablet by mouth every 8 (eight) hours as needed for moderate pain or severe pain. DO NOT EXCEED 3GM OF APAP IN 24 HOURS FROM ALL SOURCES   PAROXETINE (PAXIL) 40 MG TABLET    Take 40 mg by mouth every morning.   POLYETHYLENE GLYCOL (MIRALAX / GLYCOLAX) PACKET    Take 17 g by mouth daily. Hold if diarrhea   POTASSIUM CHLORIDE (K-DUR) 10 MEQ TABLET    Take 2 tablets (20 mEq total) by mouth daily.   PREGABALIN (LYRICA) 75 MG CAPSULE    Take 1 capsule (75 mg total) by mouth every 8 (eight) hours.   SENNA-DOCUSATE (SENOKOT-S) 8.6-50 MG TABLET    Take 2 tablets by mouth at bedtime.   SODIUM CHLORIDE (OCEAN) 0.65 % SOLN NASAL SPRAY    Place 1 spray into both nostrils as needed for congestion.   SODIUM CHLORIDE FLUSH (NORMAL SALINE FLUSH) 0.9 % SOLN    Inject 10 mLs into the vein 2 (two) times daily. Flush before and after antibiotic   TAMSULOSIN (FLOMAX) 0.4 MG CAPS CAPSULE    Take 0.4 mg by mouth daily.    TIZANIDINE (ZANAFLEX) 2 MG CAPSULE    Take 1 capsule (2 mg total) by mouth 3 (three) times daily as needed for muscle spasms.   TORSEMIDE (DEMADEX) 20 MG TABLET    Take 20 mg by mouth 2 (two) times daily.   TRAMADOL (ULTRAM) 50 MG TABLET    Take 1 tablet (50 mg total) by mouth every 6 (six) hours as needed for moderate pain.   TRIAMCINOLONE CREAM (KENALOG) 0.1 %    Apply 1 application topically 2 (two) times daily.  Modified Medications   No medications on file  Discontinued Medications   GUAIFENESIN (MUCINEX) 600 MG 12 HR TABLET    Take 600 mg by mouth 2 (two) times daily.    HEPARIN FLUSH 10 UNIT/ML SOLN INJECTION    Inject 5 mLs into the vein 2 (two) times daily.   METOPROLOL SUCCINATE (TOPROL-XL) 50  MG 24 HR TABLET    Take 50 mg by mouth daily. Take with or immediately following a meal.     SIGNIFICANT DIAGNOSTIC EXAMS  11-07-15: EEG: This EEG is indicative of sedated sleep.  No focal or epileptiform activity is seen  11-07-15: ct of head: 1. No acute intracranial pathology seen on CT. 2. Mild small vessel ischemic microangiopathy.   11-09-15: renal ultrasound: Normal kidneys bilaterally  11-10-15: ct of abdomen and pelvis: 1. No abscess identified in the abdomen or pelvis. 2. Inflammatory changes in the fat adjacent to the urinary bladder, compatible with the reported clinical history of urinary tract infection. 3. Probable vascular calcification versus tiny 2 mm nonobstructive calculus in the upper pole of the right kidney. 4. Normal appendix. 5. Splenomegaly. 6. Areas of atelectasis or resolving airspace consolidation in the lung bases bilaterally with trace right pleural effusion. 7. Atherosclerosis.  11-11-15: mri of thoracic and lumbar spine:  MRI THORACIC SPINE: No MR findings of infection within the thoracic spine. Moderate LEFT central T7-8 disc protrusion resulting in mild canal stenosis.   MRI LUMBAR SPINE: Grade 1 L4-5 anterolisthesis with severe L4-5 RIGHT facet arthropathy, inflammation versus infectious bone marrow changes and facet cyst versus abscess. Possible RIGHT psoas muscle myositis. Severe canal stenosis L3-4 and L4-5. Severe L4-5 and moderate to severe L3-4 neural foraminal narrowing.  11-11-15: mri of cervical spine: C5-6 discitis osteomyelitis with trace epidural abscess at this level. Associated paraspinal muscle edema/myositis. Severe canal stenosis at C5-6. C4-5 through C6-7 spinal cord edema/pre syrinx. No abnormal enhancement of the spinal cord. Moderate canal stenosis at C6-7, mild at C3-4 and C4-5. Multilevel neural  foraminal narrowing: Severe at C5-6 and C6-7.  11-20-15: 2-d echo; - Left ventricle: The cavity size was normal. There was mild concentric hypertrophy. Systolic function was normal. The estimated ejection fraction was in the range of 55% to 60%. Doppler parameters are consistent with abnormal left ventricular relaxation (grade 1 diastolic dysfunction). - Pericardium, extracardiac: A small pericardial effusion was identified anterior to the heart. There was no evidence of hemodynamic compromise.   11-26-15: chest x-ray: Low lung volumes with interstitial opacities, most likely atelectasis. No large pleural effusion or evidence of lobar pneumonia. Right upper extremity PICC. Since the prior plain film there has been removal of endotracheal tube and gastric tube.  11-28-15: cervical spine x-ray: Postoperative changes of C5 corpectomy with anterior plate and screw fixation of C4-C6. Note that C6 and below not well visualized secondary to overlying soft tissues. Compared to the intraoperative fluoroscopic image there is mild increased kyphosis. Significant prevertebral soft tissue swelling and gas, favored to be postoperative changes.  01-31-16: right lower extremity arterial doppler: mild atherosclerotic pvd with no hemodynamically significant stenosis and no occlusion.     LABS REVIEWED:   11-29-15: wbc 7.2; hgb 8.3; hct 27.6; mcv 85.4; plt 238; glucose 110; bun 12; creat 0.86; k+ 3.7; na++ 137 12-01-15: wbc 9.3; hgb 8.3; hct 27.3 mcv 83.5; plt 290; glucose 150; bun 9; creat 0.80; k+ 3.5; na++ 136; sed rate 132 01-06-16: wbc 7.4; hgb 8.1; hct 25.6; mcv 83.9; plt 250; sed rate >130; CRP 2.8 01-09-16: glucose 71; bun 31; creat 1.74; k+ 4.4; na++ 143 01-17-16: hgb a1c 5.5; pre-albumin 22 02-10-16: glucose 110; bun 31; creat 1.81; k+ 3.9; na++ 137 03-14-16: urine culture: pseudomonas aeruginosa: cipro  04-18-16: urine micro-albumin 130.1    Review of Systems  Constitutional: Negative for malaise/fatigue.    Respiratory: Negative for cough and shortness of breath.  Cardiovascular: Negative for chest pain, palpitations and leg swelling.  Gastrointestinal: Negative for heartburn, abdominal pain and constipation.  Musculoskeletal: Negative for myalgias, back pain and joint pain.  Skin negative  Neurological: Negative for dizziness.  Psychiatric/Behavioral: The patient is not nervous/anxious.     Physical Exam  Constitutional: He is oriented to person, place, and time. No distress.  Overweight   Eyes: Conjunctivae are normal.  Neck: Neck supple. No JVD present. No thyromegaly present.  Cardiovascular: Normal rate, regular rhythm and intact distal pulses.   Respiratory: Effort normal and breath sounds normal. No respiratory distress. He has no wheezes.  GI: Soft. Bowel sounds are normal. He exhibits no distension. There is no tenderness.  Musculoskeletal: He exhibits no edema.  Able to move all extremities  Lymphadenopathy:    He has no cervical adenopathy.  Neurological: He is alert and oriented to person, place, and time.  Skin: Skin is warm and dry. He is not diaphoretic.  Right heel DTI: 0.2 x 0.3 cm Left heel 0.5 x 0.2 cm Right outer heel: shear: 4.0 x 3.0 x 0.13 cm  Psychiatric: He has a normal mood and affect.     ASSESSMENT/ PLAN:  1. Diabetes: will continue  metformin 500 mg twice daily humalog humalog 5 units for cbg >=150 hgb a1c 5.5   2.  Chronic diastolic heart failure: has pulmonary hypertension:  EF 55-60% (11-20-15): will continue demadex 20 mg twice daily with k+ 20 meq daily   3. Hypertension: will continue  toprol xl 50 mg daily   4. COPD: will continue albuterol 2 puffs every 6 hours as needed  flonase twice daily mucinex 600 mg twice daily   5. Constipation: will continue senna s 2 tabs daily   6. Urinary retention: will continue flomax 0.4 mg daily   7. Severe cervical spinal stenosis: has chronic pain: will continue oxycontin 40 mg every 12 hours; percocet  5/325 mg every 8 hours as needed; will continue lyrica 75 mg three times daily and has valuim 5 mg every 8 hours as needed for spasms; has zanaflex 2 mg every 8 hours as needed also has ultram 50 mg every 6 hours as needed   8. Gerd: will continue prilosec 20 mg daily   9. Depression: will continue paxil 40 mg daily    Will check hgb a1c and lipids   MD is aware of resident's narcotic use and is in agreement with current plan of care. We will attempt to wean resident as apropriate   Ok Edwards NP [facility]  Contact [phone] Monday through Friday 8am- 5pm  After hours call [phone]

## 2016-06-06 DIAGNOSIS — E1169 Type 2 diabetes mellitus with other specified complication: Secondary | ICD-10-CM | POA: Insufficient documentation

## 2016-06-06 DIAGNOSIS — E785 Hyperlipidemia, unspecified: Secondary | ICD-10-CM

## 2016-06-06 DIAGNOSIS — E119 Type 2 diabetes mellitus without complications: Secondary | ICD-10-CM | POA: Insufficient documentation

## 2016-06-19 ENCOUNTER — Encounter: Payer: Self-pay | Admitting: Adult Health

## 2016-06-19 ENCOUNTER — Non-Acute Institutional Stay (SKILLED_NURSING_FACILITY): Payer: Medicaid Other | Admitting: Adult Health

## 2016-06-19 DIAGNOSIS — E1149 Type 2 diabetes mellitus with other diabetic neurological complication: Secondary | ICD-10-CM | POA: Diagnosis not present

## 2016-06-19 DIAGNOSIS — J449 Chronic obstructive pulmonary disease, unspecified: Secondary | ICD-10-CM

## 2016-06-19 DIAGNOSIS — I110 Hypertensive heart disease with heart failure: Secondary | ICD-10-CM

## 2016-06-19 DIAGNOSIS — E1169 Type 2 diabetes mellitus with other specified complication: Secondary | ICD-10-CM

## 2016-06-19 DIAGNOSIS — L89609 Pressure ulcer of unspecified heel, unspecified stage: Secondary | ICD-10-CM

## 2016-06-19 DIAGNOSIS — M4802 Spinal stenosis, cervical region: Secondary | ICD-10-CM

## 2016-06-19 DIAGNOSIS — E785 Hyperlipidemia, unspecified: Secondary | ICD-10-CM

## 2016-06-19 DIAGNOSIS — I5032 Chronic diastolic (congestive) heart failure: Secondary | ICD-10-CM

## 2016-06-19 NOTE — Progress Notes (Signed)
Location:   Psychiatrist of Service:  SNF (31)   CODE STATUS: full code   No Known Allergies  Chief Complaint  Patient presents with  . Medical Management of Chronic Issues    HPI:  He is a long term resident of this facility being seen for the management of his chronic illnesses. Overall there is little change in his status. He does spend most of his time in bed perhis choice. He is not voicing any concerns at this time. There are nursing concerns at this time.    Past Medical History:  Diagnosis Date  . Acute respiratory failure ([place])   . Anemia   . Chronic back pain   . COPD (chronic obstructive pulmonary disease) ([place])   . DDD (degenerative disc disease), lumbar   . Diabetes mellitus without complication ([place])   . Foot drop, right 12/27/2015  . Hypertension   . MRSA bacteremia   . Osteomyelitis ([place])   . Osteomyelitis ([place])   . Urinary retention   . Urinary retention     Past Surgical History:  Procedure Laterality Date  . ANTERIOR CERVICAL CORPECTOMY N/A 11/25/2015   Procedure: ANTERIOR CERVICAL FIVE CORPECTOMY Cervical four - six fusion;  Surgeon: Consuella Lose, MD;  Location: [place] [facility];  Service: Neurosurgery;  Laterality: N/A;  ANTERIOR CERVICAL FIVE CORPECTOMY Cervical four - six fusion  . POSTERIOR CERVICAL FUSION/FORAMINOTOMY N/A 11/29/2015   Procedure: Cervical Three-Cervical Seven Posterior Cervical Laminectomy with Fusion;  Surgeon: Consuella Lose, MD;  Location: [place] [facility];  Service: Neurosurgery;  Laterality: N/A;  Cervical Three-Cervical Seven Posterior Cervical Laminectomy with Fusion    Social History   Social History  . Marital status: Single    Spouse name: N/A  . Number of children: N/A  . Years of education: N/A   Occupational History  . Not on file.   Social History Main Topics  . Smoking status: Former Smoker    Packs/day: 3.00    Types: Cigarettes  . Smokeless tobacco: Never Used  . Alcohol use No  . Drug use:  No  . Sexual activity: Not on file   Other Topics Concern  . Not on file   Social History Narrative  . No narrative on file   Family History  Problem Relation Age of Onset  . Cancer Other   . Diabetes Other       VITAL SIGNS BP 115/64   Pulse 60   Temp 98.9 F (37.2 C)   Resp 18   Ht 6' (1.829 m)   Wt 214 lb 12.8 oz (97.4 kg)   SpO2 95%   BMI 29.13 kg/m   Patient's Medications  New Prescriptions   No medications on file  Previous Medications   ALBUTEROL (PROVENTIL HFA;VENTOLIN HFA) 108 (90 BASE) MCG/ACT INHALER    Inhale 2 puffs into the lungs every 6 (six) hours as needed for wheezing or shortness of breath.   AMINO ACIDS-PROTEIN HYDROLYS (FEEDING SUPPLEMENT, PRO-STAT SUGAR FREE 64,) LIQD    Take 30 mLs by mouth 3 (three) times daily with meals.   BISACODYL (DULCOLAX) 10 MG SUPPOSITORY    Place 1 suppository (10 mg total) rectally daily as needed for moderate constipation.   CALCIUM CARBONATE ANTACID (TUMS PO)    Take 2 tablets by mouth every 8 (eight) hours as needed.   DIAZEPAM (VALIUM) 5 MG TABLET    Take 1 tablet (5 mg total) by mouth every 8 (eight) hours as needed  for anxiety or muscle spasms.   FLUTICASONE (FLONASE) 50 MCG/ACT NASAL SPRAY    Place 1 spray into both nostrils 2 (two) times daily.   INSULIN LISPRO (HUMALOG) 100 UNIT/ML INJECTION    Inject 0-5 Units into the skin 3 (three) times daily before meals. If 0-149=0 units, 150-600=5 units   KETOCONAZOLE (NIZORAL) 2 % SHAMPOO    Apply 1 application topically. Apply to face every day shift on tue and Friday.   MAGNESIUM OXIDE (MAG-OX) 400 MG TABLET    Take 400 mg by mouth 2 (two) times daily.   METFORMIN (GLUCOPHAGE) 500 MG TABLET    Take 1 tablet (500 mg total) by mouth 2 (two) times daily with a meal.   METOPROLOL TARTRATE (LOPRESSOR) 25 MG TABLET    Take 25 mg by mouth daily.   MULTIPLE VITAMINS-MINERALS (DECUBI-VITE PO)    Take 1 tablet by mouth daily.   OMEPRAZOLE (PRILOSEC) 20 MG CAPSULE    Take 20 mg by  mouth daily.   OXYCODONE (OXYCONTIN) 40 MG 12 HR TABLET    Take 1 tablet (40 mg total) by mouth every 12 (twelve) hours.   OXYCODONE-ACETAMINOPHEN (ROXICET) 5-325 MG TABLET    Take 1 tablet by mouth every 8 (eight) hours as needed for moderate pain or severe pain. DO NOT EXCEED 3GM OF APAP IN 24 HOURS FROM ALL SOURCES   PAROXETINE (PAXIL) 40 MG TABLET    Take 40 mg by mouth every morning.   POLYETHYLENE GLYCOL (MIRALAX / GLYCOLAX) PACKET    Take 17 g by mouth daily. Hold if diarrhea   POTASSIUM CHLORIDE (K-DUR) 10 MEQ TABLET    Take 2 tablets (20 mEq total) by mouth daily.   PREGABALIN (LYRICA) 75 MG CAPSULE    Take 1 capsule (75 mg total) by mouth every 8 (eight) hours.   SENNA-DOCUSATE (SENOKOT-S) 8.6-50 MG TABLET    Take 2 tablets by mouth at bedtime.   SODIUM CHLORIDE (OCEAN) 0.65 % SOLN NASAL SPRAY    Place 1 spray into both nostrils as needed for congestion.   SODIUM CHLORIDE FLUSH (NORMAL SALINE FLUSH) 0.9 % SOLN    Inject 10 mLs into the vein 2 (two) times daily. Flush before and after antibiotic   TAMSULOSIN (FLOMAX) 0.4 MG CAPS CAPSULE    Take 0.4 mg by mouth daily.    TIZANIDINE (ZANAFLEX) 2 MG CAPSULE    Take 1 capsule (2 mg total) by mouth 3 (three) times daily as needed for muscle spasms.   TORSEMIDE (DEMADEX) 20 MG TABLET    Take 20 mg by mouth 2 (two) times daily.   TRAMADOL (ULTRAM) 50 MG TABLET    Take 1 tablet (50 mg total) by mouth every 6 (six) hours as needed for moderate pain.   TRIAMCINOLONE CREAM (KENALOG) 0.1 %    Apply 1 application topically 2 (two) times daily.   VITAMIN B-12 (CYANOCOBALAMIN) 1000 MCG TABLET    Take 1,000 mcg by mouth daily.  Modified Medications   No medications on file  Discontinued Medications   GLUCAGON, HUMAN RECOMBINANT, (GLUCAGEN) 1 MG INJECTION    Inject 1 mg into the vein once as needed for low blood sugar.     SIGNIFICANT DIAGNOSTIC EXAMS   11-07-15: EEG: This EEG is indicative of sedated sleep.  No focal or epileptiform activity is  seen  11-07-15: ct of head: 1. No acute intracranial pathology seen on CT. 2. Mild small vessel ischemic microangiopathy.   11-09-15: renal ultrasound: Normal kidneys bilaterally  11-10-15: ct of abdomen and pelvis: 1. No abscess identified in the abdomen or pelvis. 2. Inflammatory changes in the fat adjacent to the urinary bladder, compatible with the reported clinical history of urinary tract infection. 3. Probable vascular calcification versus tiny 2 mm nonobstructive calculus in the upper pole of the right kidney. 4. Normal appendix. 5. Splenomegaly. 6. Areas of atelectasis or resolving airspace consolidation in the lung bases bilaterally with trace right pleural effusion. 7. Atherosclerosis.  11-11-15: mri of thoracic and lumbar spine:  MRI THORACIC SPINE: No MR findings of infection within the thoracic spine. Moderate LEFT central T7-8 disc protrusion resulting in mild canal stenosis.   MRI LUMBAR SPINE: Grade 1 L4-5 anterolisthesis with severe L4-5 RIGHT facet arthropathy, inflammation versus infectious bone marrow changes and facet cyst versus abscess. Possible RIGHT psoas muscle myositis. Severe canal stenosis L3-4 and L4-5. Severe L4-5 and moderate to severe L3-4 neural foraminal narrowing.  11-11-15: mri of cervical spine: C5-6 discitis osteomyelitis with trace epidural abscess at this level. Associated paraspinal muscle edema/myositis. Severe canal stenosis at C5-6. C4-5 through C6-7 spinal cord edema/pre syrinx. No abnormal enhancement of the spinal cord. Moderate canal stenosis at C6-7, mild at C3-4 and C4-5. Multilevel neural foraminal narrowing: Severe at C5-6 and C6-7.  11-20-15: 2-d echo; - Left ventricle: The cavity size was normal. There was mild concentric hypertrophy. Systolic function was normal. The estimated ejection fraction was in the range of 55% to 60%. Doppler parameters are consistent with abnormal left ventricular relaxation (grade 1 diastolic dysfunction). -  Pericardium, extracardiac: A small pericardial effusion was identified anterior to the heart. There was no evidence of hemodynamic compromise.   11-26-15: chest x-ray: Low lung volumes with interstitial opacities, most likely atelectasis. No large pleural effusion or evidence of lobar pneumonia. Right upper extremity PICC. Since the prior plain film there has been removal of endotracheal tube and gastric tube.  11-28-15: cervical spine x-ray: Postoperative changes of C5 corpectomy with anterior plate and screw fixation of C4-C6. Note that C6 and below not well visualized secondary to overlying soft tissues. Compared to the intraoperative fluoroscopic image there is mild increased kyphosis. Significant prevertebral soft tissue swelling and gas, favored to be postoperative changes.  01-31-16: right lower extremity arterial doppler: mild atherosclerotic pvd with no hemodynamically significant stenosis and no occlusion.     LABS REVIEWED:   11-29-15: wbc 7.2; hgb 8.3; hct 27.6; mcv 85.4; plt 238; glucose 110; bun 12; creat 0.86; k+ 3.7; na++ 137 12-01-15: wbc 9.3; hgb 8.3; hct 27.3 mcv 83.5; plt 290; glucose 150; bun 9; creat 0.80; k+ 3.5; na++ 136; sed rate 132 01-06-16: wbc 7.4; hgb 8.1; hct 25.6; mcv 83.9; plt 250; sed rate >130; CRP 2.8 01-09-16: glucose 71; bun 31; creat 1.74; k+ 4.4; na++ 143 01-17-16: hgb a1c 5.5; pre-albumin 22 02-10-16: glucose 110; bun 31; creat 1.81; k+ 3.9; na++ 137 03-14-16: urine culture: pseudomonas aeruginosa: cipro  04-18-16: urine micro-albumin 130.1  05-24-16: vit B 12: 255; mag 2.0; hgb a1c 6.2    Review of Systems  Constitutional: Negative for malaise/fatigue.  Respiratory: Negative for cough and shortness of breath.   Cardiovascular: Negative for chest pain, palpitations and leg swelling.  Gastrointestinal: Negative for heartburn, abdominal pain and constipation.  Musculoskeletal: Negative for myalgias, back pain and joint pain.  Skin negative  Neurological:  Negative for dizziness.  Psychiatric/Behavioral: The patient is not nervous/anxious.     Physical Exam  Constitutional: He is oriented to person, place, and time. No distress.  Overweight   Eyes: Conjunctivae are normal.  Neck: Neck supple. No JVD present. No thyromegaly present.  Cardiovascular: Normal rate, regular rhythm and intact distal pulses.   Respiratory: Effort normal and breath sounds normal. No respiratory distress. He has no wheezes.  GI: Soft. Bowel sounds are normal. He exhibits no distension. There is no tenderness.  Musculoskeletal: He exhibits no edema.  Able to move all extremities  Lymphadenopathy:    He has no cervical adenopathy.  Neurological: He is alert and oriented to person, place, and time.  Skin: Skin is warm and dry. He is not diaphoretic.  Right heel DTI: 0.3 x 0.3 cm Left heel 0.3 x 0.2 cm Right outer heel: shear: 4.2 x 3.6 x 0.19 cm  Psychiatric: He has a normal mood and affect.     ASSESSMENT/ PLAN:  1. Diabetes: will continue  metformin 500 mg twice daily humalog humalog 5 units for cbg >=150 hgb a1c 6.2  2.  Chronic diastolic heart failure: has pulmonary hypertension:  EF 55-60% (11-20-15): will continue demadex 20 mg twice daily with k+ 20 meq daily   3. Hypertension: will continue  toprol xl 50 mg daily   4. COPD: will continue albuterol 2 puffs every 6 hours as needed  flonase twice daily mucinex 600 mg twice daily   5. Constipation: will continue senna s 2 tabs daily   6. Urinary retention: will continue flomax 0.4 mg daily   7. Severe cervical spinal stenosis: has chronic pain: will continue oxycontin 40 mg every 12 hours; percocet 5/325 mg every 8 hours as needed; will continue lyrica 75 mg three times daily and has valuim 5 mg every 8 hours as needed for spasms; has zanaflex 2 mg every 8 hours as needed also has ultram 50 mg every 6 hours as needed   8. Gerd: will continue prilosec 20 mg daily   9. Depression: will continue paxil 40  mg daily     MD is aware of resident's narcotic use and is in agreement with current plan of care. We will attempt to wean resident as apropriate   Ok Edwards NP [facility]  Contact [phone] Monday through Friday 8am- 5pm  After hours call [phone]

## 2016-07-16 ENCOUNTER — Other Ambulatory Visit: Payer: Self-pay | Admitting: *Deleted

## 2016-07-16 MED ORDER — OXYCODONE-ACETAMINOPHEN 5-325 MG PO TABS: 1.0000 | ORAL_TABLET | Freq: Three times a day (TID) | ORAL | 0 refills | Status: DC | PRN

## 2016-07-16 NOTE — Telephone Encounter (Signed)
[facility] #: [phone] Fax#: [phone]  

## 2016-07-18 ENCOUNTER — Other Ambulatory Visit: Payer: Self-pay | Admitting: *Deleted

## 2016-07-18 MED ORDER — OXYCODONE HCL 10 MG PO TABS: ORAL_TABLET | ORAL | 0 refills | Status: DC

## 2016-07-18 NOTE — Telephone Encounter (Signed)
[facility] #: [phone] Fax#: [phone]  

## 2016-08-13 ENCOUNTER — Non-Acute Institutional Stay (SKILLED_NURSING_FACILITY): Payer: Medicaid Other | Admitting: Adult Health

## 2016-08-13 ENCOUNTER — Encounter: Payer: Self-pay | Admitting: Adult Health

## 2016-08-13 DIAGNOSIS — E1169 Type 2 diabetes mellitus with other specified complication: Secondary | ICD-10-CM

## 2016-08-13 DIAGNOSIS — J449 Chronic obstructive pulmonary disease, unspecified: Secondary | ICD-10-CM | POA: Diagnosis not present

## 2016-08-13 DIAGNOSIS — R339 Retention of urine, unspecified: Secondary | ICD-10-CM | POA: Diagnosis not present

## 2016-08-13 DIAGNOSIS — E785 Hyperlipidemia, unspecified: Secondary | ICD-10-CM | POA: Diagnosis not present

## 2016-08-13 DIAGNOSIS — I5032 Chronic diastolic (congestive) heart failure: Secondary | ICD-10-CM

## 2016-08-13 DIAGNOSIS — M4802 Spinal stenosis, cervical region: Secondary | ICD-10-CM

## 2016-08-13 DIAGNOSIS — I110 Hypertensive heart disease with heart failure: Secondary | ICD-10-CM | POA: Diagnosis not present

## 2016-08-13 DIAGNOSIS — E1149 Type 2 diabetes mellitus with other diabetic neurological complication: Secondary | ICD-10-CM

## 2016-08-13 DIAGNOSIS — I272 Pulmonary hypertension, unspecified: Secondary | ICD-10-CM | POA: Diagnosis not present

## 2016-08-13 NOTE — Progress Notes (Signed)
Location:   [place] Room Number: 156 A Place of Service:  SNF (31)   CODE STATUS: Full Code  No Known Allergies  Chief Complaint  Patient presents with  . Medical Management of Chronic Issues    Routine Visit    HPI:    Past Medical History:  Diagnosis Date  . Acute respiratory failure ([place])   . Anemia   . Chronic back pain   . COPD (chronic obstructive pulmonary disease) ([place])   . DDD (degenerative disc disease), lumbar   . Diabetes mellitus without complication ([place])   . Foot drop, right 12/27/2015  . Hypertension   . MRSA bacteremia   . Osteomyelitis ([place])   . Osteomyelitis ([place])   . Urinary retention   . Urinary retention     Past Surgical History:  Procedure Laterality Date  . ANTERIOR CERVICAL CORPECTOMY N/A 11/25/2015   Procedure: ANTERIOR CERVICAL FIVE CORPECTOMY Cervical four - six fusion;  Surgeon: Consuella Lose, MD;  Location: [place] [facility];  Service: Neurosurgery;  Laterality: N/A;  ANTERIOR CERVICAL FIVE CORPECTOMY Cervical four - six fusion  . POSTERIOR CERVICAL FUSION/FORAMINOTOMY N/A 11/29/2015   Procedure: Cervical Three-Cervical Seven Posterior Cervical Laminectomy with Fusion;  Surgeon: Consuella Lose, MD;  Location: [place] [facility];  Service: Neurosurgery;  Laterality: N/A;  Cervical Three-Cervical Seven Posterior Cervical Laminectomy with Fusion    Social History   Social History  . Marital status: Single    Spouse name: N/A  . Number of children: N/A  . Years of education: N/A   Occupational History  . Not on file.   Social History Main Topics  . Smoking status: Former Smoker    Packs/day: 3.00    Types: Cigarettes  . Smokeless tobacco: Never Used  . Alcohol use No  . Drug use: No  . Sexual activity: Not on file   Other Topics Concern  . Not on file   Social History Narrative  . No narrative on file   Family History  Problem Relation Age of Onset  . Cancer Other   . Diabetes Other     Vitals:   08/13/16 1146  BP: (!) 128/59  Pulse: 82  Resp: 17  Temp: 97.9 F (36.6 C)  SpO2: 93%  Weight: 209 lb (94.8 kg)  Height: 6' (1.829 m)     Patient's Medications  New Prescriptions   No medications on file  Previous Medications   ALBUTEROL (PROVENTIL HFA;VENTOLIN HFA) 108 (90 BASE) MCG/ACT INHALER    Inhale 2 puffs into the lungs every 6 (six) hours as needed for wheezing or shortness of breath.   AMINO ACIDS-PROTEIN HYDROLYS (FEEDING SUPPLEMENT, PRO-STAT SUGAR FREE 64,) LIQD    Take 30 mLs by mouth 3 (three) times daily with meals.   BISACODYL (DULCOLAX) 10 MG SUPPOSITORY    Place 1 suppository (10 mg total) rectally daily as needed for moderate constipation.   CALCIUM CARBONATE ANTACID (TUMS PO)    Take 2 tablets by mouth every 8 (eight) hours as needed.   DIAZEPAM (VALIUM) 5 MG TABLET    Take 1 tablet (5 mg total) by mouth every 8 (eight) hours as needed for anxiety or muscle spasms.   FLUTICASONE (FLONASE) 50 MCG/ACT NASAL SPRAY    Place 1 spray into both nostrils 2 (two) times daily.   INSULIN LISPRO (HUMALOG) 100 UNIT/ML INJECTION    Inject 0-5 Units into the skin 3 (three) times daily before meals. If 0-149=0 units, 150-600=5 units   KETOCONAZOLE (  NIZORAL) 2 % SHAMPOO    Apply 1 application topically. Apply to face every day shift on tue and Friday.   MAGNESIUM OXIDE (MAG-OX) 400 MG TABLET    Take 400 mg by mouth 2 (two) times daily.   METFORMIN (GLUCOPHAGE) 500 MG TABLET    Take 1 tablet (500 mg total) by mouth 2 (two) times daily with a meal.   METOPROLOL TARTRATE (LOPRESSOR) 25 MG TABLET    Take 25 mg by mouth daily.   MULTIPLE VITAMINS-MINERALS (DECUBI-VITE PO)    Take 1 tablet by mouth daily.   OMEPRAZOLE (PRILOSEC) 20 MG CAPSULE    Take 20 mg by mouth daily.   OXYCODONE (OXYCONTIN) 40 MG 12 HR TABLET    Take 1 tablet (40 mg total) by mouth every 12 (twelve) hours.   OXYCODONE HCL 10 MG TABS    Take four tablets by mouth twice daily for pain   OXYCODONE-ACETAMINOPHEN  (ROXICET) 5-325 MG TABLET    Take 1 tablet by mouth every 8 (eight) hours as needed for moderate pain or severe pain. DO NOT EXCEED 3GM OF APAP IN 24 HOURS FROM ALL SOURCES   PAROXETINE (PAXIL) 40 MG TABLET    Take 40 mg by mouth every morning.   POLYETHYLENE GLYCOL (MIRALAX / GLYCOLAX) PACKET    Take 17 g by mouth daily. Hold if diarrhea   POTASSIUM CHLORIDE (K-DUR) 10 MEQ TABLET    Take 2 tablets (20 mEq total) by mouth daily.   PREGABALIN (LYRICA) 75 MG CAPSULE    Take 1 capsule (75 mg total) by mouth every 8 (eight) hours.   SENNA-DOCUSATE (SENOKOT-S) 8.6-50 MG TABLET    Take 2 tablets by mouth at bedtime.   SODIUM CHLORIDE (OCEAN) 0.65 % SOLN NASAL SPRAY    Place 1 spray into both nostrils as needed for congestion.   SODIUM CHLORIDE FLUSH (NORMAL SALINE FLUSH) 0.9 % SOLN    Inject 10 mLs into the vein 2 (two) times daily. Flush before and after antibiotic   TAMSULOSIN (FLOMAX) 0.4 MG CAPS CAPSULE    Take 0.4 mg by mouth daily.    TIZANIDINE (ZANAFLEX) 2 MG CAPSULE    Take 1 capsule (2 mg total) by mouth 3 (three) times daily as needed for muscle spasms.   TORSEMIDE (DEMADEX) 20 MG TABLET    Take 20 mg by mouth 2 (two) times daily.   TRAMADOL (ULTRAM) 50 MG TABLET    Take 1 tablet (50 mg total) by mouth every 6 (six) hours as needed for moderate pain.   TRIAMCINOLONE CREAM (KENALOG) 0.1 %    Apply 1 application topically 2 (two) times daily.   VITAMIN B-12 (CYANOCOBALAMIN) 1000 MCG TABLET    Take 1,000 mcg by mouth daily.  Modified Medications   No medications on file  Discontinued Medications   No medications on file     SIGNIFICANT DIAGNOSTIC EXAMS  11-07-15: EEG: This EEG is indicative of sedated sleep.  No focal or epileptiform activity is seen  11-07-15: ct of head: 1. No acute intracranial pathology seen on CT. 2. Mild small vessel ischemic microangiopathy.   11-09-15: renal ultrasound: Normal kidneys bilaterally  11-10-15: ct of abdomen and pelvis: 1. No abscess identified in the  abdomen or pelvis. 2. Inflammatory changes in the fat adjacent to the urinary bladder, compatible with the reported clinical history of urinary tract infection. 3. Probable vascular calcification versus tiny 2 mm nonobstructive calculus in the upper pole of the right kidney. 4. Normal appendix.  5. Splenomegaly. 6. Areas of atelectasis or resolving airspace consolidation in the lung bases bilaterally with trace right pleural effusion. 7. Atherosclerosis.  11-11-15: mri of thoracic and lumbar spine:  MRI THORACIC SPINE: No MR findings of infection within the thoracic spine. Moderate LEFT central T7-8 disc protrusion resulting in mild canal stenosis.   MRI LUMBAR SPINE: Grade 1 L4-5 anterolisthesis with severe L4-5 RIGHT facet arthropathy, inflammation versus infectious bone marrow changes and facet cyst versus abscess. Possible RIGHT psoas muscle myositis. Severe canal stenosis L3-4 and L4-5. Severe L4-5 and moderate to severe L3-4 neural foraminal narrowing.  11-11-15: mri of cervical spine: C5-6 discitis osteomyelitis with trace epidural abscess at this level. Associated paraspinal muscle edema/myositis. Severe canal stenosis at C5-6. C4-5 through C6-7 spinal cord edema/pre syrinx. No abnormal enhancement of the spinal cord. Moderate canal stenosis at C6-7, mild at C3-4 and C4-5. Multilevel neural foraminal narrowing: Severe at C5-6 and C6-7.  11-20-15: 2-d echo; - Left ventricle: The cavity size was normal. There was mild concentric hypertrophy. Systolic function was normal. The estimated ejection fraction was in the range of 55% to 60%. Doppler parameters are consistent with abnormal left ventricular relaxation (grade 1 diastolic dysfunction). - Pericardium, extracardiac: A small pericardial effusion was identified anterior to the heart. There was no evidence of hemodynamic compromise.   11-26-15: chest x-ray: Low lung volumes with interstitial opacities, most likely atelectasis. No large  pleural effusion or evidence of lobar pneumonia. Right upper extremity PICC. Since the prior plain film there has been removal of endotracheal tube and gastric tube.  11-28-15: cervical spine x-ray: Postoperative changes of C5 corpectomy with anterior plate and screw fixation of C4-C6. Note that C6 and below not well visualized secondary to overlying soft tissues. Compared to the intraoperative fluoroscopic image there is mild increased kyphosis. Significant prevertebral soft tissue swelling and gas, favored to be postoperative changes.  01-31-16: right lower extremity arterial doppler: mild atherosclerotic pvd with no hemodynamically significant stenosis and no occlusion.     LABS REVIEWED:   11-29-15: wbc 7.2; hgb 8.3; hct 27.6; mcv 85.4; plt 238; glucose 110; bun 12; creat 0.86; k+ 3.7; na++ 137 12-01-15: wbc 9.3; hgb 8.3; hct 27.3 mcv 83.5; plt 290; glucose 150; bun 9; creat 0.80; k+ 3.5; na++ 136; sed rate 132 01-06-16: wbc 7.4; hgb 8.1; hct 25.6; mcv 83.9; plt 250; sed rate >130; CRP 2.8 01-09-16: glucose 71; bun 31; creat 1.74; k+ 4.4; na++ 143 01-17-16: hgb a1c 5.5; pre-albumin 22 02-10-16: glucose 110; bun 31; creat 1.81; k+ 3.9; na++ 137 03-14-16: urine culture: pseudomonas aeruginosa: cipro  04-18-16: urine micro-albumin 130.1  05-24-16: vit B 12: 255; mag 2.0; hgb a1c 6.2    Review of Systems  Constitutional: Negative for malaise/fatigue.  Respiratory: Negative for cough and shortness of breath.   Cardiovascular: Negative for chest pain, palpitations and leg swelling.  Gastrointestinal: Negative for heartburn, abdominal pain and constipation.  Musculoskeletal: Negative for myalgias, back pain and joint pain.  Skin negative  Neurological: Negative for dizziness.  Psychiatric/Behavioral: The patient is not nervous/anxious.     Physical Exam  Constitutional: He is oriented to person, place, and time. No distress.  Overweight   Eyes: Conjunctivae are normal.  Neck: Neck supple. No  JVD present. No thyromegaly present.  Cardiovascular: Normal rate, regular rhythm and intact distal pulses.   Respiratory: Effort normal and breath sounds normal. No respiratory distress. He has no wheezes.  GI: Soft. Bowel sounds are normal. He exhibits no distension. There is  no tenderness.  Musculoskeletal: He exhibits no edema.  Able to move all extremities  Lymphadenopathy:    He has no cervical adenopathy.  Neurological: He is alert and oriented to person, place, and time.  Skin: Skin is warm and dry. He is not diaphoretic.  Right outer heel: shear: 3.4 x 5.0 x 0.9 cm undermining 1.0 cm calcium alginate Left great toe diabetic ulceration: 0.8 x 1.1cm is black using santyl   Psychiatric: He has a normal mood and affect.     ASSESSMENT/ PLAN:  1. Diabetes: will continue  metformin 500 mg twice daily humalog humalog 5 units for cbg >=150 hgb a1c 6.2  2.  Chronic diastolic heart failure: has pulmonary hypertension:  EF 55-60% (11-20-15): will continue demadex 20 mg twice daily with k+ 20 meq daily   3. Hypertension: will continue  toprol xl 50 mg daily   4. COPD: will continue albuterol 2 puffs every 6 hours as needed  flonase twice daily mucinex 600 mg twice daily   5. Constipation: will continue senna s 2 tabs daily   6. Urinary retention: will continue flomax 0.4 mg daily   7. Severe cervical spinal stenosis: has chronic pain: will continue oxycontin 40 mg every 12 hours; percocet 5/325 mg every 8 hours as needed; will continue lyrica 75 mg three times daily and has valuim 5 mg every 8 hours as needed for spasms; has zanaflex 2 mg every 8 hours as needed also has ultram 50 mg every 6 hours as needed   8. Gerd: will continue prilosec 20 mg daily   9. Depression: will continue paxil 40 mg daily     MD is aware of resident's narcotic use and is in agreement with current plan of care. We will attempt to wean resident as apropriate    Ok Edwards NP [facility]    Contact [phone] Monday through Friday 8am- 5pm  After hours call [phone]

## 2016-08-14 LAB — CBC AND DIFFERENTIAL
HCT: 27 % — AB (ref 41–53)
Hemoglobin: 8.3 g/dL — AB (ref 13.5–17.5)
NEUTROS ABS: 4 /uL
Platelets: 241 10*3/uL (ref 150–399)
WBC: 7.7 10*3/mL

## 2016-08-14 LAB — HEMOGLOBIN A1C: Hemoglobin A1C: 5.6

## 2016-08-14 LAB — HEPATIC FUNCTION PANEL
ALT: 7 U/L — AB (ref 10–40)
AST: 10 U/L — AB (ref 14–40)
Alkaline Phosphatase: 92 U/L (ref 25–125)
BILIRUBIN, TOTAL: 0.2 mg/dL

## 2016-08-14 LAB — BASIC METABOLIC PANEL
BUN: 15 mg/dL (ref 4–21)
Creatinine: 2.3 mg/dL — AB (ref 0.6–1.3)
GLUCOSE: 95 mg/dL
Potassium: 4.9 mmol/L (ref 3.4–5.3)
Sodium: 152 mmol/L — AB (ref 137–147)

## 2016-08-24 ENCOUNTER — Other Ambulatory Visit: Payer: Self-pay

## 2016-08-24 MED ORDER — OXYCODONE HCL ER 40 MG PO T12A: 40.0000 mg | EXTENDED_RELEASE_TABLET | Freq: Two times a day (BID) | ORAL | 0 refills | Status: DC

## 2016-08-24 NOTE — Telephone Encounter (Signed)
Prescription request was received from:  [facility]  [address] [place] [address]  PHONE: [phone]   Fax: [phone] 

## 2016-09-03 IMAGING — US US RENAL PORT
1 series · 14 of 23 positions shown · non-contrast
Comparison: 08/01/2015

CLINICAL DATA: UTI

EXAM:
RENAL / URINARY TRACT ULTRASOUND COMPLETE

[Series 1: us renal port · 0.33mm/px · 14 of 23 slices shown]
[im 1/23]
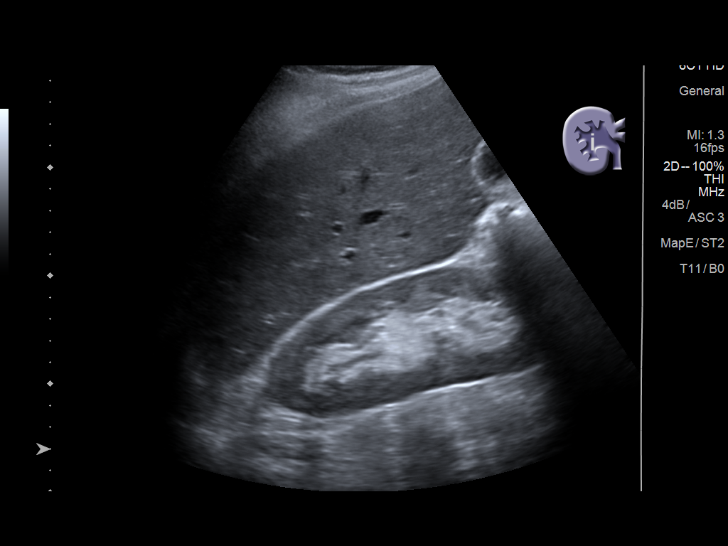
[im 3/23]
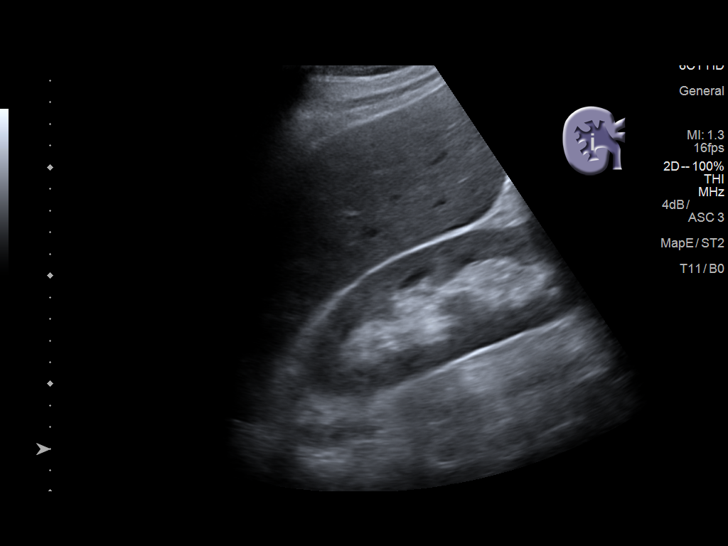
[im 5/23]
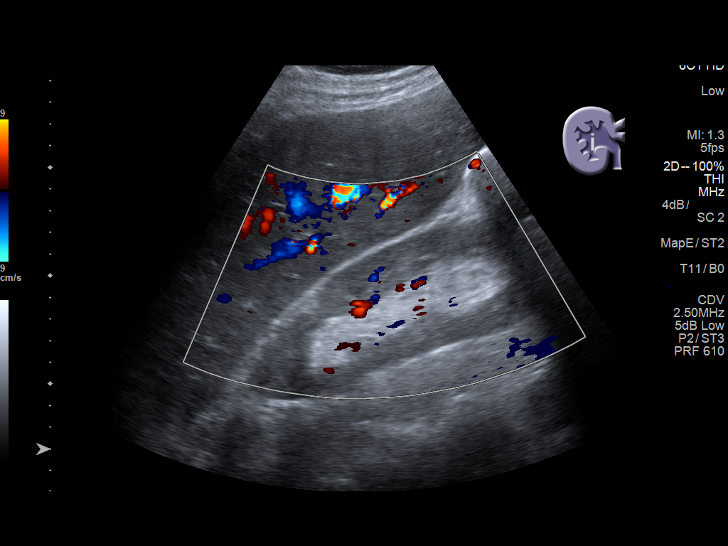
[im 6/23]
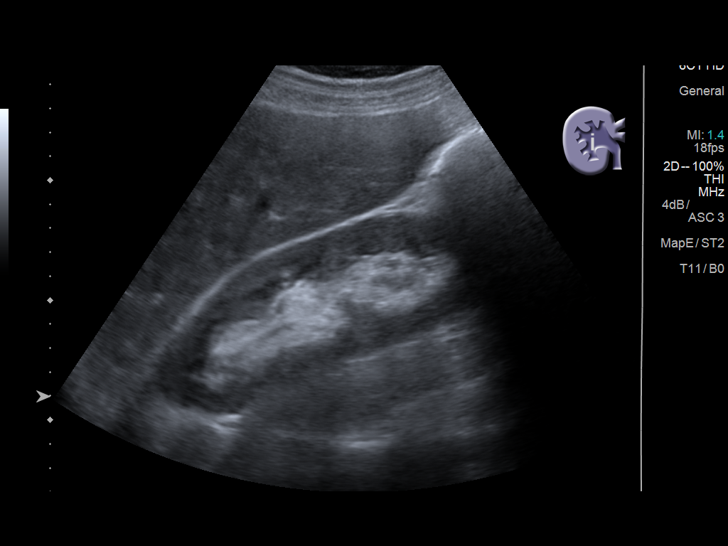
[im 8/23]
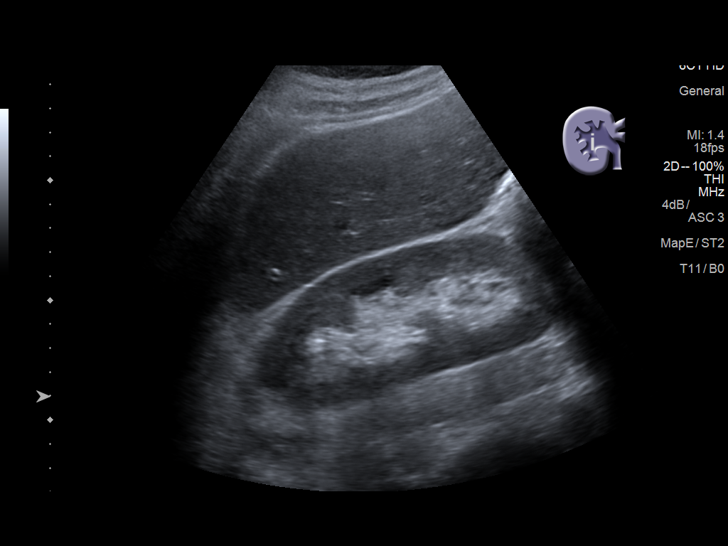
[im 10/23]
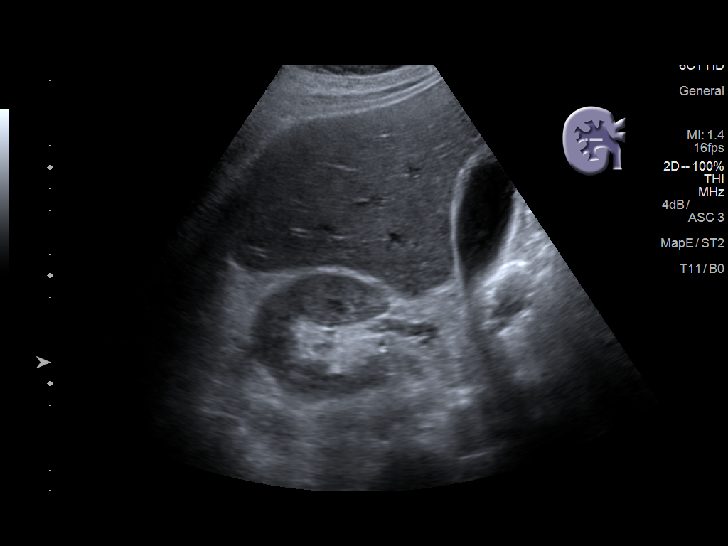
[im 11/23]
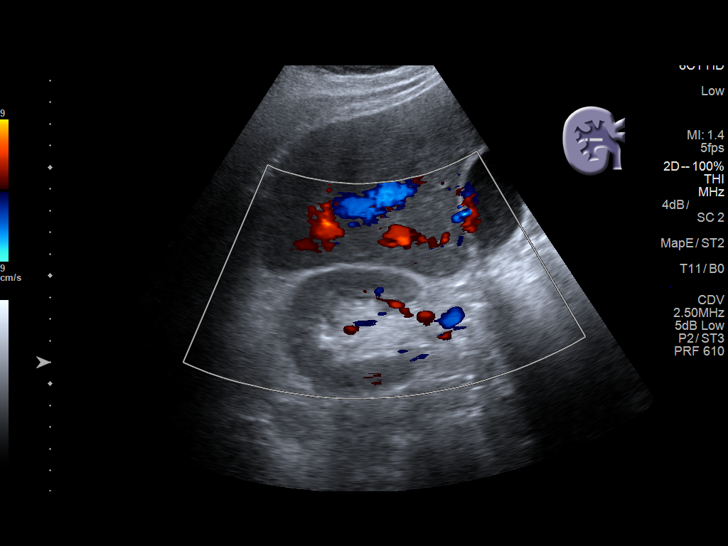
[im 13/23]
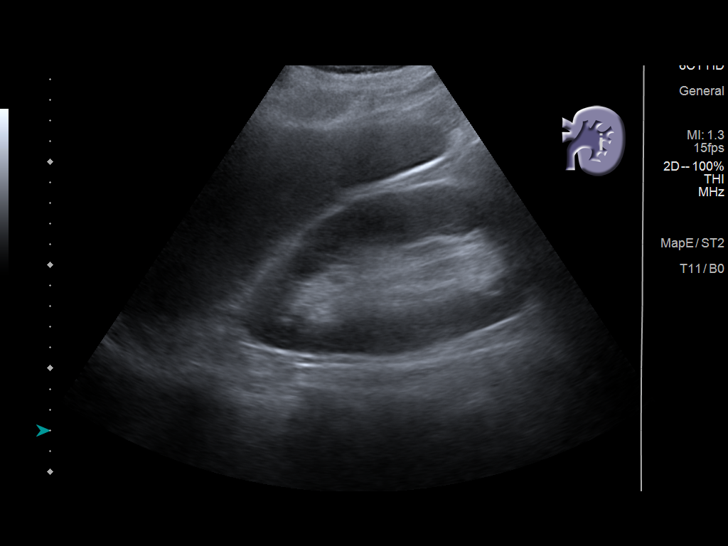
[im 14/23]
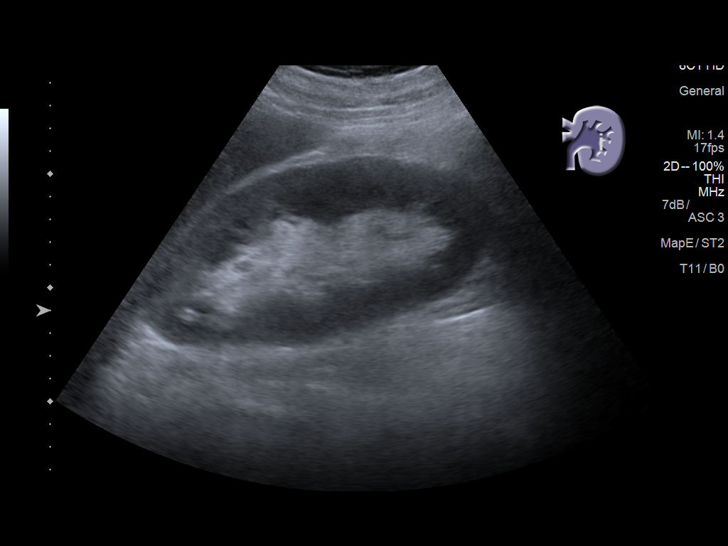
[im 16/23]
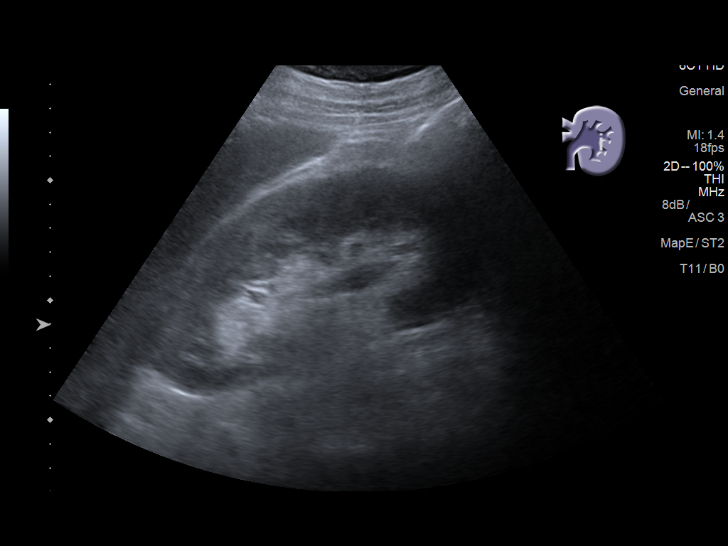
[im 18/23]
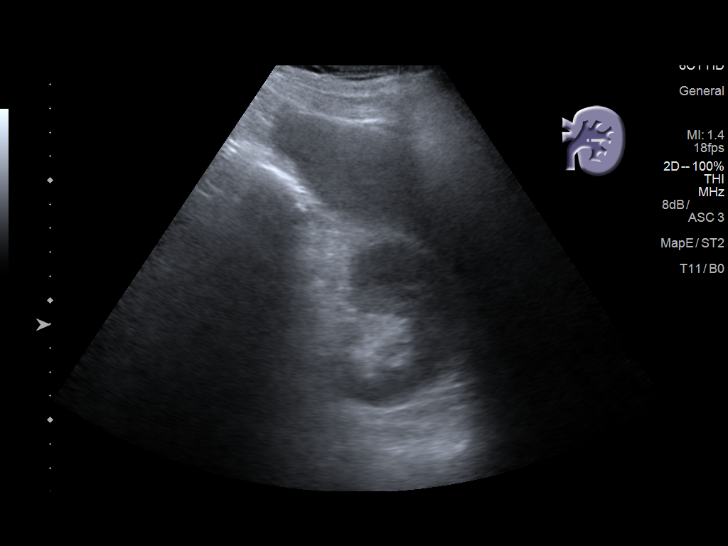
[im 19/23]
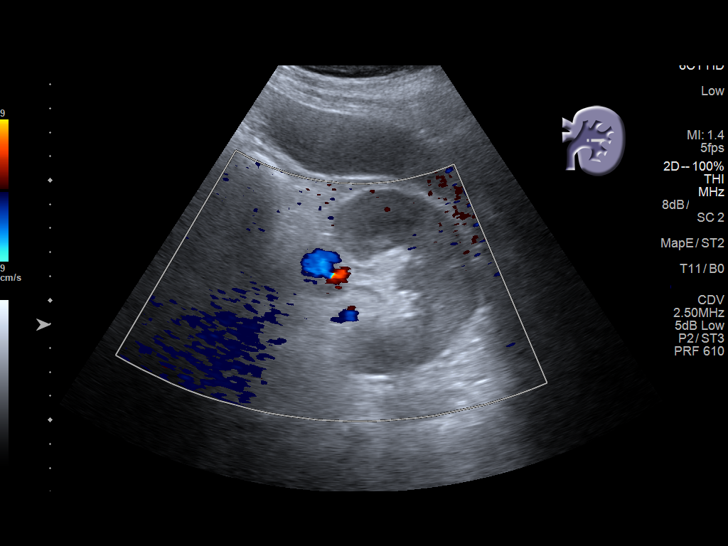
[im 21/23]
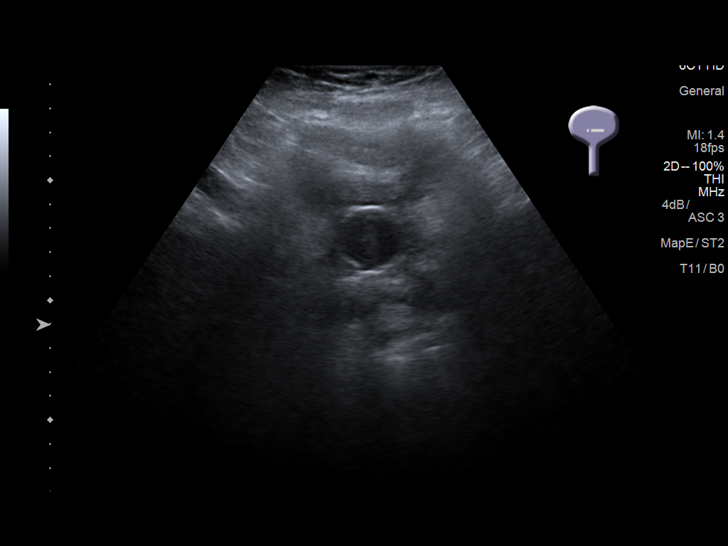
[im 23/23]
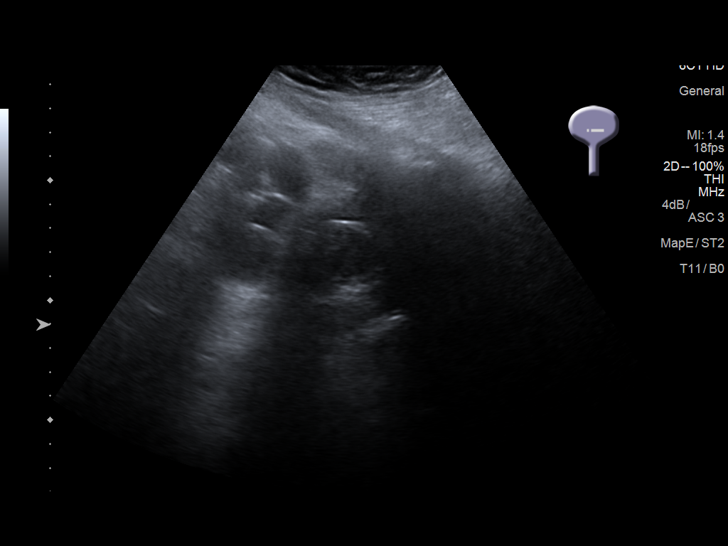

[14 of 23 positions shown; findings below may reference images not displayed]

FINDINGS: Right Kidney:

Length: 15.5 cm.. Echogenicity within normal limits. No mass or
hydronephrosis visualized.

Left Kidney:

Length: 16.1 cm.. Echogenicity within normal limits. No mass or
hydronephrosis visualized.

Bladder:

Decompressed by Foley catheter.
IMPRESSION: Normal kidneys bilaterally.

## 2016-09-10 ENCOUNTER — Encounter: Payer: Self-pay | Admitting: Adult Health

## 2016-09-10 ENCOUNTER — Non-Acute Institutional Stay (SKILLED_NURSING_FACILITY): Payer: Medicaid Other | Admitting: Adult Health

## 2016-09-10 DIAGNOSIS — M4802 Spinal stenosis, cervical region: Secondary | ICD-10-CM | POA: Diagnosis not present

## 2016-09-10 DIAGNOSIS — I5032 Chronic diastolic (congestive) heart failure: Secondary | ICD-10-CM

## 2016-09-10 DIAGNOSIS — G894 Chronic pain syndrome: Secondary | ICD-10-CM | POA: Diagnosis not present

## 2016-09-10 DIAGNOSIS — R339 Retention of urine, unspecified: Secondary | ICD-10-CM | POA: Diagnosis not present

## 2016-09-10 DIAGNOSIS — E1149 Type 2 diabetes mellitus with other diabetic neurological complication: Secondary | ICD-10-CM | POA: Diagnosis not present

## 2016-09-10 DIAGNOSIS — J449 Chronic obstructive pulmonary disease, unspecified: Secondary | ICD-10-CM

## 2016-09-10 DIAGNOSIS — I110 Hypertensive heart disease with heart failure: Secondary | ICD-10-CM

## 2016-09-10 NOTE — Progress Notes (Signed)
Location:   [facility] Room Number: 156 A Place of Service:  SNF (31)   CODE STATUS: Full Code  Allergies  Allergen Reactions  . Latex     Chief Complaint  Patient presents with  . Medical Management of Chronic Issues    1 month follow up    HPI:  He is a long term resident of this facility being seen for the management of his chronic illnesses. Overall his status is stable. He tells me that he is getting out of bed. He tells me that his bilateral feet hurt all the time; and that he is not getting any relief at this time. There are no nursing concerns at this time.    Past Medical History:  Diagnosis Date  . Acute respiratory failure ([place])   . Anemia   . Chronic back pain   . COPD (chronic obstructive pulmonary disease) ([place])   . DDD (degenerative disc disease), lumbar   . Diabetes mellitus without complication ([place])   . Foot drop, right 12/27/2015  . Hypertension   . MRSA bacteremia   . Osteomyelitis ([place])   . Osteomyelitis ([place])   . Urinary retention   . Urinary retention     Past Surgical History:  Procedure Laterality Date  . ANTERIOR CERVICAL CORPECTOMY N/A 11/25/2015   Procedure: ANTERIOR CERVICAL FIVE CORPECTOMY Cervical four - six fusion;  Surgeon: Consuella Lose, MD;  Location: [facility];  Service: Neurosurgery;  Laterality: N/A;  ANTERIOR CERVICAL FIVE CORPECTOMY Cervical four - six fusion  . POSTERIOR CERVICAL FUSION/FORAMINOTOMY N/A 11/29/2015   Procedure: Cervical Three-Cervical Seven Posterior Cervical Laminectomy with Fusion;  Surgeon: Consuella Lose, MD;  Location: [facility];  Service: Neurosurgery;  Laterality: N/A;  Cervical Three-Cervical Seven Posterior Cervical Laminectomy with Fusion    Social History   Social History  . Marital status: Single    Spouse name: N/A  . Number of children: N/A  . Years of education: N/A   Occupational History  . Not on file.   Social History Main Topics  . Smoking status: Former  Smoker    Packs/day: 3.00    Types: Cigarettes  . Smokeless tobacco: Never Used  . Alcohol use No  . Drug use: No  . Sexual activity: Not on file   Other Topics Concern  . Not on file   Social History Narrative  . No narrative on file   Family History  Problem Relation Age of Onset  . Cancer Other   . Diabetes Other       VITAL SIGNS BP (!) 102/55   Pulse 61   Temp 98.6 F (37 C)   Resp 18   Ht 6' (1.829 m)   Wt 215 lb (97.5 kg)   SpO2 94%   BMI 29.16 kg/m   Patient's Medications  New Prescriptions   No medications on file  Previous Medications   ALBUTEROL (PROVENTIL HFA;VENTOLIN HFA) 108 (90 BASE) MCG/ACT INHALER    Inhale 2 puffs into the lungs every 6 (six) hours as needed for wheezing or shortness of breath.   AMINO ACIDS-PROTEIN HYDROLYS (FEEDING SUPPLEMENT, PRO-STAT SUGAR FREE 64,) LIQD    Take 30 mLs by mouth 3 (three) times daily with meals.   BISACODYL (DULCOLAX) 10 MG SUPPOSITORY    Place 1 suppository (10 mg total) rectally daily as needed for moderate constipation.   BUPROPION (WELLBUTRIN) 75 MG TABLET    Take 75 mg by mouth. Give 75 mg by mouth in  the morning for depression   CALCIUM CARBONATE ANTACID (TUMS PO)    Take 2 tablets by mouth every 8 (eight) hours as needed.   CEPHALEXIN (KEFLEX) 500 MG CAPSULE    Take 500 mg by mouth 4 (four) times daily. For Cellulitis until seen by wound care specialist   DIAZEPAM (VALIUM) 5 MG TABLET    Take 1 tablet (5 mg total) by mouth every 8 (eight) hours as needed for anxiety or muscle spasms.   DOXYCYCLINE (DORYX) 100 MG EC TABLET    Take 100 mg by mouth 2 (two) times daily.    FLUTICASONE (FLONASE) 50 MCG/ACT NASAL SPRAY    Place 1 spray into both nostrils 2 (two) times daily.   GLUCAGON (GLUCAGON EMERGENCY) 1 MG INJECTION    Inject 1 mg into the muscle. Inject 1 mg intramuscular as needed for blood sugar less than 70   INSULIN LISPRO (HUMALOG) 100 UNIT/ML INJECTION    Inject 0-5 Units into the skin 3 (three) times  daily before meals. If 0-149=0 units, 150-600=5 units   KETOCONAZOLE (NIZORAL) 2 % SHAMPOO    Apply 1 application topically. Apply to face every day shift on tue and Friday.   MAGNESIUM OXIDE (MAG-OX) 400 MG TABLET    Take 400 mg by mouth 2 (two) times daily.   METFORMIN (GLUCOPHAGE) 500 MG TABLET    Take 1 tablet (500 mg total) by mouth 2 (two) times daily with a meal.   METOPROLOL TARTRATE (LOPRESSOR) 25 MG TABLET    Take 25 mg by mouth daily.   MULTIPLE VITAMINS-MINERALS (DECUBI-VITE PO)    Take 1 tablet by mouth daily.   OMEPRAZOLE (PRILOSEC) 20 MG CAPSULE    Take 20 mg by mouth daily.   OXYCODONE (OXYCONTIN) 40 MG 12 HR TABLET    Take 1 tablet (40 mg total) by mouth every 12 (twelve) hours.   OXYCODONE-ACETAMINOPHEN (ROXICET) 5-325 MG TABLET    Take 1 tablet by mouth every 8 (eight) hours as needed for moderate pain or severe pain. DO NOT EXCEED 3GM OF APAP IN 24 HOURS FROM ALL SOURCES   PAROXETINE (PAXIL) 40 MG TABLET    Take 40 mg by mouth every morning.   POLYETHYLENE GLYCOL (MIRALAX / GLYCOLAX) PACKET    Take 17 g by mouth daily. Hold if diarrhea   POTASSIUM CHLORIDE (K-DUR) 10 MEQ TABLET    Take 2 tablets (20 mEq total) by mouth daily.   PREGABALIN (LYRICA) 75 MG CAPSULE    Take 1 capsule (75 mg total) by mouth every 8 (eight) hours.   SACCHAROMYCES BOULARDII (FLORASTOR PO)    Take by mouth. Give 1 capsule by mouth two times a day for prophylaxis for 8 weeks   SENNA-DOCUSATE (SENOKOT-S) 8.6-50 MG TABLET    Take 2 tablets by mouth at bedtime.   SODIUM CHLORIDE (OCEAN) 0.65 % SOLN NASAL SPRAY    Place 1 spray into both nostrils as needed for congestion.   TAMSULOSIN (FLOMAX) 0.4 MG CAPS CAPSULE    Take 0.4 mg by mouth daily.    TIZANIDINE (ZANAFLEX) 2 MG CAPSULE    Take 1 capsule (2 mg total) by mouth 3 (three) times daily as needed for muscle spasms.   TORSEMIDE (DEMADEX) 20 MG TABLET    Take 20 mg by mouth 2 (two) times daily.   TRIAMCINOLONE CREAM (KENALOG) 0.1 %    Apply 1 application  topically 2 (two) times daily.   VITAMIN B-12 (CYANOCOBALAMIN) 1000 MCG TABLET  Take 1,000 mcg by mouth daily.   WOUND DRESSINGS GEL    Apply topically as needed for wound care. Apply to left 1st toe topically daily for 30 days  Modified Medications   No medications on file  Discontinued Medications   TRAMADOL (ULTRAM) 50 MG TABLET    Take 1 tablet (50 mg total) by mouth every 6 (six) hours as needed for moderate pain.     SIGNIFICANT DIAGNOSTIC EXAMS  11-07-15: EEG: This EEG is indicative of sedated sleep.  No focal or epileptiform activity is seen  11-07-15: ct of head: 1. No acute intracranial pathology seen on CT. 2. Mild small vessel ischemic microangiopathy.   11-09-15: renal ultrasound: Normal kidneys bilaterally  11-10-15: ct of abdomen and pelvis: 1. No abscess identified in the abdomen or pelvis. 2. Inflammatory changes in the fat adjacent to the urinary bladder, compatible with the reported clinical history of urinary tract infection. 3. Probable vascular calcification versus tiny 2 mm nonobstructive calculus in the upper pole of the right kidney. 4. Normal appendix. 5. Splenomegaly. 6. Areas of atelectasis or resolving airspace consolidation in the lung bases bilaterally with trace right pleural effusion. 7. Atherosclerosis.  11-11-15: mri of thoracic and lumbar spine:  MRI THORACIC SPINE: No MR findings of infection within the thoracic spine. Moderate LEFT central T7-8 disc protrusion resulting in mild canal stenosis.   MRI LUMBAR SPINE: Grade 1 L4-5 anterolisthesis with severe L4-5 RIGHT facet arthropathy, inflammation versus infectious bone marrow changes and facet cyst versus abscess. Possible RIGHT psoas muscle myositis. Severe canal stenosis L3-4 and L4-5. Severe L4-5 and moderate to severe L3-4 neural foraminal narrowing.  11-11-15: mri of cervical spine: C5-6 discitis osteomyelitis with trace epidural abscess at this level. Associated paraspinal muscle  edema/myositis. Severe canal stenosis at C5-6. C4-5 through C6-7 spinal cord edema/pre syrinx. No abnormal enhancement of the spinal cord. Moderate canal stenosis at C6-7, mild at C3-4 and C4-5. Multilevel neural foraminal narrowing: Severe at C5-6 and C6-7.  11-20-15: 2-d echo; - Left ventricle: The cavity size was normal. There was mild concentric hypertrophy. Systolic function was normal. The estimated ejection fraction was in the range of 55% to 60%. Doppler parameters are consistent with abnormal left ventricular relaxation (grade 1 diastolic dysfunction). - Pericardium, extracardiac: A small pericardial effusion was identified anterior to the heart. There was no evidence of hemodynamic compromise.   11-26-15: chest x-ray: Low lung volumes with interstitial opacities, most likely atelectasis. No large pleural effusion or evidence of lobar pneumonia. Right upper extremity PICC. Since the prior plain film there has been removal of endotracheal tube and gastric tube.  11-28-15: cervical spine x-ray: Postoperative changes of C5 corpectomy with anterior plate and screw fixation of C4-C6. Note that C6 and below not well visualized secondary to overlying soft tissues. Compared to the intraoperative fluoroscopic image there is mild increased kyphosis. Significant prevertebral soft tissue swelling and gas, favored to be postoperative changes.  01-31-16: right lower extremity arterial doppler: mild atherosclerotic pvd with no hemodynamically significant stenosis and no occlusion.     07-24-16: bone biopsy: + osteomyelitis     LABS REVIEWED:   11-29-15: wbc 7.2; hgb 8.3; hct 27.6; mcv 85.4; plt 238; glucose 110; bun 12; creat 0.86; k+ 3.7; na++ 137 12-01-15: wbc 9.3; hgb 8.3; hct 27.3 mcv 83.5; plt 290; glucose 150; bun 9; creat 0.80; k+ 3.5; na++ 136; sed rate 132 01-06-16: wbc 7.4; hgb 8.1; hct 25.6; mcv 83.9; plt 250; sed rate >130; CRP 2.8 01-09-16: glucose  71; bun 31; creat 1.74; k+ 4.4; na++  143 01-17-16: hgb a1c 5.5; pre-albumin 22 02-10-16: glucose 110; bun 31; creat 1.81; k+ 3.9; na++ 137 03-14-16: urine culture: pseudomonas aeruginosa: cipro  04-18-16: urine micro-albumin 130.1  05-24-16: vit B 12: 255; mag 2.0; hgb a1c 6.2  07-27-16: CRP latex: reactive  08-08-16: wound culture: MRSA  08-14-16: wbc 7.7; hgb 8.3; hct 27.3; mcv 87.7; plt 241; glucose 95; bun 33.8; creat 2.26; k+ 4.3; na++ 139; liver normal albumin 3.7; hgb a1c 5.6    Review of Systems  Constitutional: Negative for malaise/fatigue.  Respiratory: Negative for cough and shortness of breath.   Cardiovascular: Negative for chest pain, palpitations and leg swelling.  Gastrointestinal: Negative for heartburn, abdominal pain and constipation.  Musculoskeletal:  Has bilateral foot pain  Skin negative  Neurological: Negative for dizziness.  Psychiatric/Behavioral: The patient is not nervous/anxious.     Physical Exam  Constitutional: He is oriented to person, place, and time. No distress.  Overweight   Eyes: Conjunctivae are normal.  Neck: Neck supple. No JVD present. No thyromegaly present.  Cardiovascular: Normal rate, regular rhythm and intact distal pulses.   Respiratory: Effort normal and breath sounds normal. No respiratory distress. He has no wheezes.  GI: Soft. Bowel sounds are normal. He exhibits no distension. There is no tenderness.  Musculoskeletal: He exhibits no edema.  Able to move all extremities  Lymphadenopathy:    He has no cervical adenopathy.  Neurological: He is alert and oriented to person, place, and time.  Skin: Skin is warm and dry. He is not diaphoretic.  Right heel shear: 1.5 x 3.9 x 0.4 cm Left great toe: diabetic wound: 0.8 x 0.3 x 0.2 cm  Psychiatric: He has a normal mood and affect.     ASSESSMENT/ PLAN:  1. Diabetes: hgb a1c 5.6 ( previous 6.2)  will continue  metformin 500 mg twice daily humalog humalog 5 units for cbg >=150   2.  Chronic diastolic heart failure: has  pulmonary hypertension:  EF 55-60% (11-20-15): will continue demadex 20 mg twice daily with k+ 20 meq daily   3. Hypertension: will continue lopressor 25 mg daily   4. COPD: will continue albuterol 2 puffs every 6 hours as needed  flonase twice daily   5. Constipation: will continue senna s 2 tabs daily miralax daily   6. Urinary retention: will continue flomax 0.4 mg daily   7. Severe cervical spinal stenosis: has chronic pain: will continue ; percocet 5/325 mg every 8 hours as needed; will continue lyrica 75 mg three times daily and has valuim 5 mg every 8 hours as needed for spasms; has zanaflex 2 mg every 8 hours as needed will stop ultram as this medication is not providing any benefit and will increase oxycontin to 60 mg twice daily   8. Gerd: will continue prilosec 20 mg daily   9. Depression: will continue paxil 40 mg daily    MD is aware of resident's narcotic use and is in agreement with current plan of care. We will attempt to wean resident as apropriate     Ok Edwards NP [facility]  Contact [phone] Monday through Friday 8am- 5pm  After hours call [phone]

## 2016-09-27 DIAGNOSIS — G8929 Other chronic pain: Secondary | ICD-10-CM | POA: Insufficient documentation

## 2016-10-02 ENCOUNTER — Encounter: Payer: Self-pay | Admitting: Infectious Disease

## 2016-10-02 ENCOUNTER — Other Ambulatory Visit: Payer: Self-pay | Admitting: Infectious Disease

## 2016-10-02 ENCOUNTER — Ambulatory Visit (INDEPENDENT_AMBULATORY_CARE_PROVIDER_SITE_OTHER): Payer: Medicaid Other | Admitting: Infectious Disease

## 2016-10-02 ENCOUNTER — Encounter: Payer: Self-pay | Admitting: *Deleted

## 2016-10-02 ENCOUNTER — Ambulatory Visit
Admission: RE | Admit: 2016-10-02 | Discharge: 2016-10-02 | Disposition: A | Discharge status: Home / Self Care | Payer: Medicaid Other | Source: Ambulatory Visit | Attending: Infectious Disease | Admitting: Infectious Disease

## 2016-10-02 ENCOUNTER — Other Ambulatory Visit: Payer: Medicaid Other

## 2016-10-02 VITALS — BP 89/63 | HR 53 | Temp 98.6°F

## 2016-10-02 DIAGNOSIS — E1149 Type 2 diabetes mellitus with other diabetic neurological complication: Secondary | ICD-10-CM

## 2016-10-02 DIAGNOSIS — L97522 Non-pressure chronic ulcer of other part of left foot with fat layer exposed: Secondary | ICD-10-CM

## 2016-10-02 DIAGNOSIS — M21372 Foot drop, left foot: Secondary | ICD-10-CM

## 2016-10-02 DIAGNOSIS — E11621 Type 2 diabetes mellitus with foot ulcer: Secondary | ICD-10-CM

## 2016-10-02 DIAGNOSIS — R7881 Bacteremia: Secondary | ICD-10-CM

## 2016-10-02 DIAGNOSIS — M86171 Other acute osteomyelitis, right ankle and foot: Secondary | ICD-10-CM

## 2016-10-02 DIAGNOSIS — M21371 Foot drop, right foot: Secondary | ICD-10-CM

## 2016-10-02 DIAGNOSIS — L97529 Non-pressure chronic ulcer of other part of left foot with unspecified severity: Secondary | ICD-10-CM | POA: Insufficient documentation

## 2016-10-02 DIAGNOSIS — G062 Extradural and subdural abscess, unspecified: Secondary | ICD-10-CM

## 2016-10-02 DIAGNOSIS — E08621 Diabetes mellitus due to underlying condition with foot ulcer: Secondary | ICD-10-CM

## 2016-10-02 HISTORY — DX: Extradural and subdural abscess, unspecified: G06.2

## 2016-10-02 HISTORY — DX: Type 2 diabetes mellitus with foot ulcer: L97.529

## 2016-10-02 HISTORY — DX: Type 2 diabetes mellitus with foot ulcer: E11.621

## 2016-10-02 HISTORY — DX: Other acute osteomyelitis, right ankle and foot: M86.171

## 2016-10-02 LAB — CBC WITH DIFFERENTIAL/PLATELET
BASOS ABS: 0 {cells}/uL (ref 0–200)
Basophils Relative: 0 %
EOS PCT: 5 %
Eosinophils Absolute: 340 cells/uL (ref 15–500)
HCT: 31.3 % — ABNORMAL LOW (ref 38.5–50.0)
HEMOGLOBIN: 9.7 g/dL — AB (ref 13.2–17.1)
LYMPHS ABS: 3196 {cells}/uL (ref 850–3900)
Lymphocytes Relative: 47 %
MCH: 26.9 pg — ABNORMAL LOW (ref 27.0–33.0)
MCHC: 31 g/dL — ABNORMAL LOW (ref 32.0–36.0)
MCV: 86.7 fL (ref 80.0–100.0)
MPV: 10.4 fL (ref 7.5–12.5)
Monocytes Absolute: 476 cells/uL (ref 200–950)
Monocytes Relative: 7 %
NEUTROS ABS: 2788 {cells}/uL (ref 1500–7800)
Neutrophils Relative %: 41 %
PLATELETS: 263 10*3/uL (ref 140–400)
RBC: 3.61 MIL/uL — AB (ref 4.20–5.80)
RDW: 19 % — ABNORMAL HIGH (ref 11.0–15.0)
WBC: 6.8 10*3/uL (ref 3.8–10.8)

## 2016-10-02 LAB — COMPLETE METABOLIC PANEL WITH GFR
ALBUMIN: 3.7 g/dL (ref 3.6–5.1)
ALK PHOS: 77 U/L (ref 40–115)
ALT: 9 U/L (ref 9–46)
AST: 10 U/L (ref 10–35)
BUN: 36 mg/dL — ABNORMAL HIGH (ref 7–25)
CO2: 27 mmol/L (ref 20–31)
Calcium: 9.4 mg/dL (ref 8.6–10.3)
Chloride: 99 mmol/L (ref 98–110)
Creat: 2.67 mg/dL — ABNORMAL HIGH (ref 0.70–1.33)
GFR, EST AFRICAN AMERICAN: 29 mL/min — AB (ref >=60)
GFR, EST NON AFRICAN AMERICAN: 25 mL/min — AB (ref >=60)
GLUCOSE: 130 mg/dL — AB (ref 65–99)
Potassium: 4.9 mmol/L (ref 3.5–5.3)
SODIUM: 139 mmol/L (ref 135–146)
Total Bilirubin: 0.4 mg/dL (ref 0.2–1.2)
Total Protein: 7.1 g/dL (ref 6.1–8.1)

## 2016-10-02 NOTE — Patient Instructions (Signed)
WE will get xrays and labs today  I need you to be seen by Ortho foot surgeon ASAP, referring to Dr. Doran Durand  We will make appt with [place] in next 1-2 months  IF you have surgery on foot we wuold like to be notified and involved  We may call you to ask  You to stop your keflex

## 2016-10-02 NOTE — Progress Notes (Signed)
Chief complaint: followup for MRSA bacteremia disseminated infection including cervical and lumbar diskitis including large epidural abscess sp Neurosurgery with decompressive laminectomy  Subjective:    Patient ID: Gregory [name], male    DOB: 03-21-1961, 56 y.o.   MRN: [id number]  HPI  56 y.o. 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 This was first presented to [facility] with shoulder and back pain on 08/01/2015. He was found to have bacteremia from 2/13-2/25. Eventually transferred [facility] for management of MRSA disseminated disease. He was found to have multiple sources suggesting osteomyelitis though did not undergo debridement. His hospital stay was complicated by respiratory failure requiring intubation 2/2 possible oversedation vs worsening pneumonia. He was discharged to SNF for prolonged abtx therapy which ended on may 1st, and had catheter removed by IR on 5/11. It does not look like he had further abtx. He then presented to [facility] on 5/22 with AMS though to be due to opiates, hypoxia, and high fever which have persisted despite abtx. Infectious work up,  . Repeat imaging of spine suggested ongoing destruction of cervical spine. And large epidural abscess   He ultimately underwent   1. C3-7 laminectomy for decompression of spinal cord 2. Placement of posterior segmental instrumentation, C3-7 Medtronic lateral mass 3. Posterolateral arthrodesis, C3-4, C4-5, C5-6, C6-7 4. Use of non-structural morcellized bone allograft (Vitoss BA, DBX putty)  On June 9th. I do not see any cultures from that surgery. He has been on IV vancomycin since then and close to completing 4 weeks of postop antibiotics. He states that his left shoulder no longer hurts as it once did.  He showed me an ulcer on his lower right foot where he has foot drop at last visit.  This visit he is accompanied by one of his daughters who is a Marine scientist who works at [employer]. She knew his medical history much better than he did and she told me that he been diagnosed with osteomyelitis in his foot at [facility].   Found an MRI which showed the following in February of 2017   -Multiple ulcers involving the right forefoot; the largest is at the plantar aspect first MTP joint with enhancement extending into the first metatarsophalangeal joint, tendinitis of the flexor hallucis longus at this level. -Osteomyelitis of the lateral sesamoid.  -Focal enhancement in the first metatarsal head is nonspecific, more likely related reactive or due to degenerative disease than infection.  Plain films did not show osteomyelitis at that time.  His ulcer appears much worse to me today . I have looked back at my prior note I have a photograph of the dorsum of his foot but not of his heel where he has the ulcer that I'm examining today.  Hhe has been started on Keflex and followed by wound care I am worried that the ulcer which I'm seeing in the heel may extend to bone and the may have osteomyelitis in his heel. I'll get plain films today and going to refer him to Dr. Doran Durand with orthopedic surgery. He also has an ulcer in his great toe on the left foot as well which she's had for several weeks time.    Past Medical History:  Diagnosis Date  . Acute respiratory failure ([place])   . Anemia   .  Chronic back pain   . COPD (chronic obstructive pulmonary disease) ([place])   . DDD (degenerative disc disease), lumbar   . Diabetes mellitus without complication ([place])   . Foot drop, right 12/27/2015  . Hypertension   . MRSA bacteremia   . Osteomyelitis ([place])   . Osteomyelitis ([place])   . Urinary retention   . Urinary retention     Past Surgical History:  Procedure Laterality Date  . ANTERIOR CERVICAL CORPECTOMY N/A  11/25/2015   Procedure: ANTERIOR CERVICAL FIVE CORPECTOMY Cervical four - six fusion;  Surgeon: Consuella Lose, MD;  Location: [facility];  Service: Neurosurgery;  Laterality: N/A;  ANTERIOR CERVICAL FIVE CORPECTOMY Cervical four - six fusion  . POSTERIOR CERVICAL FUSION/FORAMINOTOMY N/A 11/29/2015   Procedure: Cervical Three-Cervical Seven Posterior Cervical Laminectomy with Fusion;  Surgeon: Consuella Lose, MD;  Location: [place] [facility];  Service: Neurosurgery;  Laterality: N/A;  Cervical Three-Cervical Seven Posterior Cervical Laminectomy with Fusion    Family History  Problem Relation Age of Onset  . Cancer Other   . Diabetes Other       Social History   Social History  . Marital status: Single    Spouse name: N/A  . Number of children: N/A  . Years of education: N/A   Social History Main Topics  . Smoking status: Former Smoker    Packs/day: 3.00    Types: Cigarettes  . Smokeless tobacco: Never Used  . Alcohol use No  . Drug use: No  . Sexual activity: Not Asked   Other Topics Concern  . None   Social History Narrative  . None    Allergies  Allergen Reactions  . Latex      Current Outpatient Prescriptions:  .  albuterol (PROVENTIL HFA;VENTOLIN HFA) 108 (90 Base) MCG/ACT inhaler, Inhale 2 puffs into the lungs every 6 (six) hours as needed for wheezing or shortness of breath., Disp: , Rfl:  .  Amino Acids-Protein Hydrolys (FEEDING SUPPLEMENT, PRO-STAT SUGAR FREE 64,) LIQD, Take 30 mLs by mouth 3 (three) times daily with meals., Disp: , Rfl:  .  bisacodyl (DULCOLAX) 10 MG suppository, Place 1 suppository (10 mg total) rectally daily as needed for moderate constipation., Disp: 12 suppository, Rfl: 0 .  buPROPion (WELLBUTRIN) 75 MG tablet, Take 75 mg by mouth. Give 75 mg by mouth in the morning for depression, Disp: , Rfl:  .  Calcium Carbonate Antacid (TUMS PO), Take 2 tablets by mouth every 8 (eight) hours as needed., Disp: , Rfl:  .  cephALEXin (KEFLEX) 500 MG  capsule, Take 500 mg by mouth 4 (four) times daily. For Cellulitis until seen by wound care specialist, Disp: , Rfl:  .  diazepam (VALIUM) 5 MG tablet, Take 1 tablet (5 mg total) by mouth every 8 (eight) hours as needed for anxiety or muscle spasms., Disp: 10 tablet, Rfl: 0 .  doxycycline (DORYX) 100 MG EC tablet, Take 100 mg by mouth 2 (two) times daily. , Disp: , Rfl:  .  fluticasone (FLONASE) 50 MCG/ACT nasal spray, Place 1 spray into both nostrils 2 (two) times daily., Disp: , Rfl:  .  glucagon (GLUCAGON EMERGENCY) 1 MG injection, Inject 1 mg into the muscle. Inject 1 mg intramuscular as needed for blood sugar less than 70, Disp: , Rfl:  .  insulin lispro (HUMALOG) 100 UNIT/ML injection, Inject 0-5 Units into the skin 3 (three) times daily before meals. If 0-149=0 units, 150-600=5 units, Disp: , Rfl:  .  ketoconazole (NIZORAL) 2 % shampoo,  Apply 1 application topically. Apply to face every day shift on tue and Friday., Disp: , Rfl:  .  magnesium oxide (MAG-OX) 400 MG tablet, Take 400 mg by mouth 2 (two) times daily., Disp: , Rfl:  .  metFORMIN (GLUCOPHAGE) 500 MG tablet, Take 1 tablet (500 mg total) by mouth 2 (two) times daily with a meal., Disp: 60 tablet, Rfl: 0 .  metoprolol tartrate (LOPRESSOR) 25 MG tablet, Take 25 mg by mouth daily., Disp: , Rfl:  .  Multiple Vitamins-Minerals (DECUBI-VITE PO), Take 1 tablet by mouth daily., Disp: , Rfl:  .  omeprazole (PRILOSEC) 20 MG capsule, Take 20 mg by mouth daily., Disp: , Rfl:  .  oxyCODONE (OXYCONTIN) 40 mg 12 hr tablet, Take 1 tablet (40 mg total) by mouth every 12 (twelve) hours., Disp: 60 tablet, Rfl: 0 .  oxyCODONE-acetaminophen (ROXICET) 5-325 MG tablet, Take 1 tablet by mouth every 8 (eight) hours as needed for moderate pain or severe pain. DO NOT EXCEED 3GM OF APAP IN 24 HOURS FROM ALL SOURCES, Disp: 90 tablet, Rfl: 0 .  PARoxetine (PAXIL) 40 MG tablet, Take 40 mg by mouth every morning., Disp: , Rfl:  .  polyethylene glycol (MIRALAX /  GLYCOLAX) packet, Take 17 g by mouth daily. Hold if diarrhea, Disp: 14 each, Rfl: 0 .  potassium chloride (K-DUR) 10 MEQ tablet, Take 2 tablets (20 mEq total) by mouth daily., Disp: 60 tablet, Rfl: 0 .  pregabalin (LYRICA) 75 MG capsule, Take 1 capsule (75 mg total) by mouth every 8 (eight) hours., Disp: 90 capsule, Rfl: 0 .  Saccharomyces boulardii (FLORASTOR PO), Take by mouth. Give 1 capsule by mouth two times a day for prophylaxis for 8 weeks, Disp: , Rfl:  .  senna-docusate (SENOKOT-S) 8.6-50 MG tablet, Take 2 tablets by mouth at bedtime., Disp: 30 tablet, Rfl: 0 .  sodium chloride (OCEAN) 0.65 % SOLN nasal spray, Place 1 spray into both nostrils as needed for congestion., Disp: 1 Bottle, Rfl: 0 .  tamsulosin (FLOMAX) 0.4 MG CAPS capsule, Take 0.4 mg by mouth daily. , Disp: , Rfl:  .  tizanidine (ZANAFLEX) 2 MG capsule, Take 1 capsule (2 mg total) by mouth 3 (three) times daily as needed for muscle spasms., Disp: 30 capsule, Rfl: 0 .  torsemide (DEMADEX) 20 MG tablet, Take 20 mg by mouth 2 (two) times daily., Disp: , Rfl:  .  triamcinolone cream (KENALOG) 0.1 %, Apply 1 application topically 2 (two) times daily., Disp: , Rfl:  .  vitamin B-12 (CYANOCOBALAMIN) 1000 MCG tablet, Take 1,000 mcg by mouth daily., Disp: , Rfl:    Review of Systems  Constitutional: Negative for activity change, appetite change, chills, diaphoresis, fever and unexpected weight change.  HENT: Negative for congestion, rhinorrhea, sinus pressure, sneezing, sore throat and trouble swallowing.   Eyes: Negative for photophobia and visual disturbance.  Respiratory: Negative for cough, chest tightness, shortness of breath, wheezing and stridor.   Cardiovascular: Negative for chest pain, palpitations and leg swelling.  Gastrointestinal: Negative for abdominal distention, abdominal pain, anal bleeding, blood in stool, constipation, diarrhea, nausea and vomiting.  Genitourinary: Negative for difficulty urinating, dysuria, flank  pain and hematuria.  Musculoskeletal: Positive for myalgias. Negative for arthralgias, back pain, gait problem and joint swelling.  Skin: Positive for wound. Negative for color change, pallor and rash.  Neurological: Positive for weakness. Negative for dizziness, tremors and light-headedness.  Hematological: Negative for adenopathy. Does not bruise/bleed easily.  Psychiatric/Behavioral: Negative for agitation, behavioral problems, confusion, decreased  concentration, dysphoric mood and sleep disturbance.       Objective:   Physical Exam  Constitutional: He is oriented to person, place, and time. He appears well-developed and well-nourished.  HENT:  Head: Normocephalic and atraumatic.  Eyes: Conjunctivae and EOM are normal.  Neck: Normal range of motion. Neck supple.  Cardiovascular: Normal rate and regular rhythm.   Pulmonary/Chest: Effort normal. No respiratory distress. He has no wheezes.  Abdominal: Soft. He exhibits no distension.  Musculoskeletal: Normal range of motion. He exhibits no edema or tenderness.  Neurological: He is alert and oriented to person, place, and time.  Skin: Skin is warm and dry. No rash noted. No erythema. There is pallor.  Psychiatric: He has a normal mood and affect. His speech is normal and behavior is normal.    Right foot with ulcers as below 12/27/15:     Right foot dorsum for 17 2018:    Right heel 10/02/2016:    Left foot April 17,018:            Assessment & Plan:   MRSA bacteremia with metastatic infection including large epidural abscesss sp Neurosurgery  --I will Continue him on chronic doxycycline --I am very concerned that his foot ulcers may be the source of his bacteremia. In particular I'm concerned about his deep ulcer in his heel. Fortunately he did not have clear-cut osteomyelitis when seen at [facility] by MRI in February 2017 which is what I was worried about but I'm still worried now that he may have it  now.  Again we'll get plain films and have him seen by Dr. Doran Durand. We'll check inflammatory markers.  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 Doran Durand.  I've written a note asking that he not be placed in a standing position by physical therapy where pressure is going to be applied to this ulcer.  Keflex not clinically making a difference and if he has a surgical intervention that is not a below the knee and dictation but rather debridement then I would like to have cultures done with him off of his Keflex that we should just continue on his doxycycline for now.  Hx of osteo of clavicle: will need to reassess this not just clinically but with imaging, need to address this at next visit.   I spent greater than 35  minutes with the patient including greater than 50% of time in face to face counsel of the patient re his MRSA bacteremia with diskitis, osteomyelitis, , ulcers on the foot,and in coordination of her care.

## 2016-10-03 ENCOUNTER — Telehealth: Payer: Self-pay | Admitting: *Deleted

## 2016-10-03 LAB — C-REACTIVE PROTEIN: CRP: 30.6 mg/L — ABNORMAL HIGH (ref <8.0)

## 2016-10-03 LAB — SEDIMENTATION RATE: Sed Rate: 84 mm/hr — ABNORMAL HIGH (ref 0–20)

## 2016-10-03 NOTE — Telephone Encounter (Addendum)
Shared Dr. Derek Mound message with facility RN.  [place] requested a call when the appointment is scheduled so that they can arrange transportation.  [phone]

## 2016-10-03 NOTE — Telephone Encounter (Signed)
We are trying to arrange him being seen by Dr. Doran Durand he has osteomyelitis I think is going to need a below the knee amputation

## 2016-10-03 NOTE — Telephone Encounter (Signed)
RN needing clarification for antibiotics.  Does the patient need to discontinue Keflex.  Dr. Tommy Medal notified of this question.

## 2016-10-04 NOTE — Telephone Encounter (Signed)
Thanks [employer]. I texted Wylene Simmer about this patient and he will be on the lookout for them

## 2016-10-05 ENCOUNTER — Other Ambulatory Visit: Payer: Self-pay

## 2016-10-05 LAB — BASIC METABOLIC PANEL
BUN: 32 mg/dL — AB (ref 4–21)
Creatinine: 2 mg/dL — AB (ref 0.6–1.3)
GLUCOSE: 91 mg/dL
Potassium: 4.9 mmol/L (ref 3.4–5.3)
Sodium: 136 mmol/L — AB (ref 137–147)

## 2016-10-05 LAB — HEPATIC FUNCTION PANEL
ALK PHOS: 88 U/L (ref 25–125)
ALT: 9 U/L — AB (ref 10–40)
AST: 11 U/L — AB (ref 14–40)
Bilirubin, Total: 0.2 mg/dL

## 2016-10-05 MED ORDER — OXYCODONE HCL ER 20 MG PO T12A: 60.0000 mg | EXTENDED_RELEASE_TABLET | Freq: Two times a day (BID) | ORAL | 0 refills | Status: DC

## 2016-10-05 NOTE — Telephone Encounter (Signed)
RX faxed to [facility] @ [phone], phone number [phone] 

## 2016-10-08 ENCOUNTER — Non-Acute Institutional Stay (SKILLED_NURSING_FACILITY): Payer: Medicaid Other | Admitting: Adult Health

## 2016-10-08 ENCOUNTER — Encounter: Payer: Self-pay | Admitting: Adult Health

## 2016-10-08 DIAGNOSIS — M86171 Other acute osteomyelitis, right ankle and foot: Secondary | ICD-10-CM | POA: Diagnosis not present

## 2016-10-08 DIAGNOSIS — I5032 Chronic diastolic (congestive) heart failure: Secondary | ICD-10-CM | POA: Diagnosis not present

## 2016-10-08 DIAGNOSIS — R7881 Bacteremia: Secondary | ICD-10-CM

## 2016-10-08 DIAGNOSIS — I110 Hypertensive heart disease with heart failure: Secondary | ICD-10-CM | POA: Diagnosis not present

## 2016-10-08 DIAGNOSIS — G062 Extradural and subdural abscess, unspecified: Secondary | ICD-10-CM

## 2016-10-08 DIAGNOSIS — B9562 Methicillin resistant Staphylococcus aureus infection as the cause of diseases classified elsewhere: Secondary | ICD-10-CM

## 2016-10-08 DIAGNOSIS — G894 Chronic pain syndrome: Secondary | ICD-10-CM | POA: Diagnosis not present

## 2016-10-08 DIAGNOSIS — E1149 Type 2 diabetes mellitus with other diabetic neurological complication: Secondary | ICD-10-CM | POA: Diagnosis not present

## 2016-10-08 DIAGNOSIS — E1169 Type 2 diabetes mellitus with other specified complication: Secondary | ICD-10-CM | POA: Diagnosis not present

## 2016-10-08 DIAGNOSIS — J449 Chronic obstructive pulmonary disease, unspecified: Secondary | ICD-10-CM

## 2016-10-08 DIAGNOSIS — E785 Hyperlipidemia, unspecified: Secondary | ICD-10-CM

## 2016-10-08 NOTE — Progress Notes (Signed)
Location:   [facility] Room Number: 156 A Place of Service:  SNF (31)   CODE STATUS: Full Code  Allergies  Allergen Reactions  . Latex     Chief Complaint  Patient presents with  . Medical Management of Chronic Issues    1 month follow up    HPI:  He is a long term resident of this facility being seen for the management of his chronic illnesses. Overall his status is stable. He does spend nearly all of his time in bed. There are no nursing concerns at this time.    Past Medical History:  Diagnosis Date  . Acute osteomyelitis of calcaneum, right ([place]) 10/02/2016  . Acute respiratory failure ([place])   . Anemia   . Chronic back pain   . COPD (chronic obstructive pulmonary disease) ([place])   . DDD (degenerative disc disease), lumbar   . Diabetes mellitus without complication ([place])   . Diabetic ulcer of toe of left foot ([place]) 10/02/2016  . 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 Foot drop, right 12/27/2015  . Hypertension   . MRSA bacteremia   . Osteomyelitis ([place])   . Osteomyelitis ([place])   . Urinary retention   . Urinary retention     Past Surgical History:  Procedure Laterality Date  . ANTERIOR CERVICAL CORPECTOMY N/A 11/25/2015   Procedure: ANTERIOR CERVICAL FIVE CORPECTOMY Cervical four - six fusion;  Surgeon: Consuella Lose, MD;  Location: [facility];  Service: Neurosurgery;  Laterality: N/A;  ANTERIOR CERVICAL FIVE CORPECTOMY Cervical four - six fusion  . POSTERIOR CERVICAL FUSION/FORAMINOTOMY N/A 11/29/2015   Procedure: Cervical Three-Cervical Seven Posterior Cervical Laminectomy with Fusion;  Surgeon: Consuella Lose, MD;  Location: [facility];  Service: Neurosurgery;  Laterality: N/A;  Cervical Three-Cervical Seven Posterior Cervical Laminectomy with Fusion    Social History   Social History  . Marital status: Single    Spouse name: N/A  . Number of children: N/A  . Years of education: N/A   Occupational History  . Not on file.   Social History  Main Topics  . Smoking status: Former Smoker    Packs/day: 3.00    Types: Cigarettes  . Smokeless tobacco: Never Used  . Alcohol use No  . Drug use: No  . Sexual activity: Not on file   Other Topics Concern  . Not on file   Social History Narrative  . No narrative on file   Family History  Problem Relation Age of Onset  . Cancer Other   . Diabetes Other       VITAL SIGNS BP (!) 113/59   Pulse 65   Temp 97.8 F (36.6 C)   Resp 18   Ht 6' (1.829 m)   Wt 215 lb (97.5 kg)   SpO2 91%   BMI 29.16 kg/m   Patient's Medications  New Prescriptions   No medications on file  Previous Medications   ALBUTEROL (PROVENTIL HFA;VENTOLIN HFA) 108 (90 BASE) MCG/ACT INHALER    Inhale 2 puffs into the lungs every 6 (six) hours as needed for wheezing or shortness of breath.   AMINO ACIDS-PROTEIN HYDROLYS (FEEDING SUPPLEMENT, PRO-STAT SUGAR FREE 64,) LIQD    Take 30 mLs by mouth 3 (three) times daily with meals.   BISACODYL (DULCOLAX) 10 MG SUPPOSITORY    Place 1 suppository (10 mg total) rectally daily as needed for moderate constipation.   BUPROPION (WELLBUTRIN SR) 200 MG 12 HR TABLET    Take 2 tablets by mouth  daily for depression   CALCIUM CARBONATE ANTACID (TUMS PO)    Take 2 tablets by mouth every 8 (eight) hours as needed.   CEPHALEXIN (KEFLEX) 500 MG CAPSULE    Take 500 mg by mouth 4 (four) times daily. For Cellulitis until seen by wound care specialist   DIAZEPAM (VALIUM) 5 MG TABLET    Take 1 tablet (5 mg total) by mouth every 8 (eight) hours as needed for anxiety or muscle spasms.   DOXYCYCLINE (DORYX) 100 MG EC TABLET    Take 100 mg by mouth 2 (two) times daily.    FLUTICASONE (FLONASE) 50 MCG/ACT NASAL SPRAY    Place 1 spray into both nostrils 2 (two) times daily.   GLUCAGON (GLUCAGON EMERGENCY) 1 MG INJECTION    Inject 1 mg into the muscle. Inject 1 mg intramuscular as needed for blood sugar less than 70   INSULIN LISPRO (HUMALOG) 100 UNIT/ML INJECTION    Inject 0-5 Units into  the skin 3 (three) times daily before meals. If 0-149=0 units, 150-600=5 units   KETOCONAZOLE (NIZORAL) 2 % SHAMPOO    Apply 1 application topically. Apply to face every day shift on tue and Friday.   MAGNESIUM OXIDE (MAG-OX) 400 MG TABLET    Take 400 mg by mouth 2 (two) times daily.   METFORMIN (GLUCOPHAGE) 500 MG TABLET    Take 1 tablet (500 mg total) by mouth 2 (two) times daily with a meal.   METOPROLOL TARTRATE (LOPRESSOR) 25 MG TABLET    Take 25 mg by mouth daily.   MULTIPLE VITAMINS-MINERALS (DECUBI-VITE PO)    Take 1 tablet by mouth daily.   OMEPRAZOLE (PRILOSEC) 20 MG CAPSULE    Take 20 mg by mouth daily.   OXYCODONE (OXYCONTIN) 20 MG 12 HR TABLET    Take 3 tablets (60 mg total) by mouth every 12 (twelve) hours.   OXYCODONE-ACETAMINOPHEN (ROXICET) 5-325 MG TABLET    Take 1 tablet by mouth every 8 (eight) hours as needed for moderate pain or severe pain. DO NOT EXCEED 3GM OF APAP IN 24 HOURS FROM ALL SOURCES   PAROXETINE (PAXIL) 40 MG TABLET    Take 40 mg by mouth every morning.   POLYETHYLENE GLYCOL (MIRALAX / GLYCOLAX) PACKET    Take 17 g by mouth daily. Hold if diarrhea   POTASSIUM CHLORIDE (K-DUR) 10 MEQ TABLET    Take 2 tablets (20 mEq total) by mouth daily.   PREGABALIN (LYRICA) 75 MG CAPSULE    Take 1 capsule (75 mg total) by mouth every 8 (eight) hours.   SENNA-DOCUSATE (SENOKOT-S) 8.6-50 MG TABLET    Take 2 tablets by mouth at bedtime.   SODIUM CHLORIDE (OCEAN) 0.65 % SOLN NASAL SPRAY    Place 1 spray into both nostrils as needed for congestion.   TAMSULOSIN (FLOMAX) 0.4 MG CAPS CAPSULE    Take 0.4 mg by mouth daily.    TIZANIDINE (ZANAFLEX) 2 MG CAPSULE    Take 1 capsule (2 mg total) by mouth 3 (three) times daily as needed for muscle spasms.   TORSEMIDE (DEMADEX) 20 MG TABLET    Take 20 mg by mouth 2 (two) times daily.   TRIAMCINOLONE CREAM (KENALOG) 0.1 %    Apply 1 application topically 2 (two) times daily.   VITAMIN B-12 (CYANOCOBALAMIN) 1000 MCG TABLET    Take 1,000 mcg by  mouth daily.  Modified Medications   No medications on file  Discontinued Medications   BUPROPION (WELLBUTRIN) 75 MG TABLET  Take 75 mg by mouth. Give 75 mg by mouth in the morning for depression   OXYCODONE (OXYCONTIN) 40 MG 12 HR TABLET    Take 1 tablet (40 mg total) by mouth every 12 (twelve) hours.   SACCHAROMYCES BOULARDII (FLORASTOR PO)    Take by mouth. Give 1 capsule by mouth two times a day for prophylaxis for 8 weeks     SIGNIFICANT DIAGNOSTIC EXAMS  11-07-15: EEG: This EEG is indicative of sedated sleep.  No focal or epileptiform activity is seen  11-07-15: ct of head: 1. No acute intracranial pathology seen on CT. 2. Mild small vessel ischemic microangiopathy.   11-09-15: renal ultrasound: Normal kidneys bilaterally  11-10-15: ct of abdomen and pelvis: 1. No abscess identified in the abdomen or pelvis. 2. Inflammatory changes in the fat adjacent to the urinary bladder, compatible with the reported clinical history of urinary tract infection. 3. Probable vascular calcification versus tiny 2 mm nonobstructive calculus in the upper pole of the right kidney. 4. Normal appendix. 5. Splenomegaly. 6. Areas of atelectasis or resolving airspace consolidation in the lung bases bilaterally with trace right pleural effusion. 7. Atherosclerosis.  11-11-15: mri of thoracic and lumbar spine:  MRI THORACIC SPINE: No MR findings of infection within the thoracic spine. Moderate LEFT central T7-8 disc protrusion resulting in mild canal stenosis.   MRI LUMBAR SPINE: Grade 1 L4-5 anterolisthesis with severe L4-5 RIGHT facet arthropathy, inflammation versus infectious bone marrow changes and facet cyst versus abscess. Possible RIGHT psoas muscle myositis. Severe canal stenosis L3-4 and L4-5. Severe L4-5 and moderate to severe L3-4 neural foraminal narrowing.  11-11-15: mri of cervical spine: C5-6 discitis osteomyelitis with trace epidural abscess at this level. Associated paraspinal muscle  edema/myositis. Severe canal stenosis at C5-6. C4-5 through C6-7 spinal cord edema/pre syrinx. No abnormal enhancement of the spinal cord. Moderate canal stenosis at C6-7, mild at C3-4 and C4-5. Multilevel neural foraminal narrowing: Severe at C5-6 and C6-7.  11-20-15: 2-d echo; - Left ventricle: The cavity size was normal. There was mild concentric hypertrophy. Systolic function was normal. The estimated ejection fraction was in the range of 55% to 60%. Doppler parameters are consistent with abnormal left ventricular relaxation (grade 1 diastolic dysfunction). - Pericardium, extracardiac: A small pericardial effusion was identified anterior to the heart. There was no evidence of hemodynamic compromise.   11-26-15: chest x-ray: Low lung volumes with interstitial opacities, most likely atelectasis. No large pleural effusion or evidence of lobar pneumonia. Right upper extremity PICC. Since the prior plain film there has been removal of endotracheal tube and gastric tube.  11-28-15: cervical spine x-ray: Postoperative changes of C5 corpectomy with anterior plate and screw fixation of C4-C6. Note that C6 and below not well visualized secondary to overlying soft tissues. Compared to the intraoperative fluoroscopic image there is mild increased kyphosis. Significant prevertebral soft tissue swelling and gas, favored to be postoperative changes.  01-31-16: right lower extremity arterial doppler: mild atherosclerotic pvd with no hemodynamically significant stenosis and no occlusion.    07-24-16: bone biopsy: + osteomyelitis   10-02-16: right ankle x-ray: There is osteolysis and cortical irregularity posterior aspect of calcaneus highly suspicious for osteomyelitis. There is adjacent soft tissue irregularity probable nonhealing wound.   10-02-16: left foot x-ray: No objective evidence of osteomyelitis of the great toe but the AP view is nondiagnostic. There is soft tissue swelling of the toe consistent with  cellulitis. There is a diffuse permeative pattern of the foot with subcentimeter focal areas of lucency  which may reflect disuse osteoporosis but a systemic process such as multiple myeloma could produce similar findings.  10-14-26; right foot x-ray: Soft tissue swelling and ulceration over the calcaneus with adjacent calcaneal cortical erosions suggesting osteomyelitis.     LABS REVIEWED:   11-29-15: wbc 7.2; hgb 8.3; hct 27.6; mcv 85.4; plt 238; glucose 110; bun 12; creat 0.86; k+ 3.7; na++ 137 12-01-15: wbc 9.3; hgb 8.3; hct 27.3 mcv 83.5; plt 290; glucose 150; bun 9; creat 0.80; k+ 3.5; na++ 136; sed rate 132 01-06-16: wbc 7.4; hgb 8.1; hct 25.6; mcv 83.9; plt 250; sed rate >130; CRP 2.8 01-09-16: glucose 71; bun 31; creat 1.74; k+ 4.4; na++ 143 01-17-16: hgb a1c 5.5; pre-albumin 22 02-10-16: glucose 110; bun 31; creat 1.81; k+ 3.9; na++ 137 03-14-16: urine culture: pseudomonas aeruginosa: cipro  04-18-16: urine micro-albumin 130.1  05-24-16: vit B 12: 255; mag 2.0; hgb a1c 6.2  07-27-16: CRP latex: reactive  08-08-16: wound culture: MRSA  08-14-16: wbc 7.7; hgb 8.3; hct 27.3; mcv 87.7; plt 241; glucose 95; bun 33.8; creat 2.26; k+ 4.3; na++ 139; liver normal albumin 3.7; hgb a1c 5.6  10-02-16: wbc 6.8; hgb 9.7; hct 31.3; mcv 86.2; plt 263; glucose 130; bun 36; creat 2.67; k+ 4.9; na++ 139; liver normal albumin 3.7; sed rate 84; CRP 30.6    Review of Systems  Constitutional: Negative for malaise/fatigue.  Respiratory: Negative for cough and shortness of breath.   Cardiovascular: Negative for chest pain, palpitations and leg swelling.  Gastrointestinal: Negative for heartburn, abdominal pain and constipation.  Musculoskeletal:  Has bilateral foot pain  Skin negative  Neurological: Negative for dizziness.  Psychiatric/Behavioral: The patient is not nervous/anxious.     Physical Exam  Constitutional: He is oriented to person, place, and time. No distress.  Overweight   Eyes: Conjunctivae are  normal.  Neck: Neck supple. No JVD present. No thyromegaly present.  Cardiovascular: Normal rate, regular rhythm and intact distal pulses.   Respiratory: Effort normal and breath sounds normal. No respiratory distress. He has no wheezes.  GI: Soft. Bowel sounds are normal. He exhibits no distension. There is no tenderness.  Musculoskeletal: He exhibits no edema.  Able to move all extremities  Lymphadenopathy:    He has no cervical adenopathy.  Neurological: He is alert and oriented to person, place, and time.  Skin: Skin is warm and dry. He is not diaphoretic.  Right heel shear: 0.6 x 0.3 x 0.2 cm Left great toe: diabetic wound: 0.1 x 0.3 x 0.1 cm Left toe diabetic ulceration   Psychiatric: He has a normal mood and affect.     ASSESSMENT/ PLAN:  1. Diabetes: hgb a1c 5.6 ( previous 6.2)  will continue  metformin 500 mg twice daily humalog humalog 5 units for cbg >=150   2.  Chronic diastolic heart failure: has pulmonary hypertension:  EF 55-60% (11-20-15): will continue demadex 20 mg twice daily with k+ 20 meq daily   3. Hypertension: will continue lopressor 25 mg daily   4. COPD: will continue albuterol 2 puffs every 6 hours as needed  flonase twice daily   5. Constipation: will continue senna s 2 tabs daily miralax daily   6. Urinary retention: will continue flomax 0.4 mg daily   7. Severe cervical spinal stenosis: has chronic pain: will continue ; percocet 5/325 mg every 8 hours as needed; will continue lyrica 75 mg three times daily and has valuim 5 mg every 8 hours as needed for spasms; has zanaflex 2  mg every 8 hours as needed  will continue oxycontin  60 mg twice daily   8. Gerd: will continue prilosec 20 mg daily   9. Depression: will continue paxil 40 mg daily   9. MRSA bacteremia with metastatic infection including large epidural abscess: has acute osteomyelitis right calcaneum:  Has diabetic foot ulcer with deep ulcer in the right heel:  He is on chronic doxycycline 100  mg twice daily and keflex 500 mg four times daily is followed by I/D.    MD is aware of resident's narcotic use and is in agreement with current plan of care. We will attempt to wean resident as apropriate   Ok Edwards NP [facility]  Contact [phone] Monday through Friday 8am- 5pm  After hours call [phone]

## 2016-10-10 ENCOUNTER — Telehealth: Payer: Self-pay | Admitting: *Deleted

## 2016-10-10 ENCOUNTER — Telehealth: Payer: Self-pay | Admitting: Infectious Disease

## 2016-10-10 NOTE — Telephone Encounter (Signed)
Patients Wound care MD called me yesterday re desire to treat this pt with IV antibiotics  However I did not have the patients chart in front of me to clearly and completely review his records  This man has CKD and should NEVER be touched with IV VANCOMYCIN  THERE IS ABSOLUTELY NO POINT IN TRYING TO SALVAGE THIS MAN;S INFECTED CALCANEOUS GIVEN THAT  #1 HE CANNOT WALK  #2 THIS INFECTION COULD BE A SOURCE OF HIS RECENT BACTEREMIA AND EPIDURAL ABSCESS THAT CAUSED HIM TO NEARLY BE PARALYZED.  I REFUSED TO FILL THE IV VANCOMYCIN ORDERS FROM [facility]  IA M HAPPY TO TALK TO THE WOUND CARE MD BUT I THINK IT IS CLEAR PT NEEDS CURATIVE BKA AND I WOULD LIKE THE PATIENT SEEN BY DR. Doran Durand ASAP

## 2016-10-10 NOTE — Telephone Encounter (Signed)
Information sent to [facility] and waiting on response.

## 2016-10-12 LAB — BASIC METABOLIC PANEL
BUN: 41 mg/dL — AB (ref 4–21)
Creatinine: 2.7 mg/dL — AB (ref 0.6–1.3)
GLUCOSE: 103 mg/dL
Potassium: 4.7 mmol/L (ref 3.4–5.3)
Sodium: 141 mmol/L (ref 137–147)

## 2016-10-19 ENCOUNTER — Other Ambulatory Visit: Payer: Self-pay | Admitting: *Deleted

## 2016-10-19 MED ORDER — OXYCODONE HCL ER 60 MG PO T12A: EXTENDED_RELEASE_TABLET | ORAL | 0 refills | Status: DC

## 2016-10-19 NOTE — Telephone Encounter (Signed)
[facility] #: [phone] Fax#: [phone]  

## 2016-10-22 LAB — BASIC METABOLIC PANEL
BUN: 26 mg/dL — AB (ref 4–21)
Creatinine: 2 mg/dL — AB (ref 0.6–1.3)
GLUCOSE: 154 mg/dL
POTASSIUM: 4.8 mmol/L (ref 3.4–5.3)
SODIUM: 138 mmol/L (ref 137–147)

## 2016-11-05 ENCOUNTER — Encounter: Payer: Self-pay | Admitting: Adult Health

## 2016-11-05 ENCOUNTER — Non-Acute Institutional Stay (SKILLED_NURSING_FACILITY): Payer: Medicaid Other | Admitting: Adult Health

## 2016-11-05 DIAGNOSIS — I110 Hypertensive heart disease with heart failure: Secondary | ICD-10-CM | POA: Diagnosis not present

## 2016-11-05 DIAGNOSIS — I5032 Chronic diastolic (congestive) heart failure: Secondary | ICD-10-CM | POA: Diagnosis not present

## 2016-11-05 DIAGNOSIS — F39 Unspecified mood [affective] disorder: Secondary | ICD-10-CM | POA: Insufficient documentation

## 2016-11-05 DIAGNOSIS — G894 Chronic pain syndrome: Secondary | ICD-10-CM

## 2016-11-05 DIAGNOSIS — M86171 Other acute osteomyelitis, right ankle and foot: Secondary | ICD-10-CM | POA: Diagnosis not present

## 2016-11-05 DIAGNOSIS — R7881 Bacteremia: Secondary | ICD-10-CM | POA: Diagnosis not present

## 2016-11-05 DIAGNOSIS — R339 Retention of urine, unspecified: Secondary | ICD-10-CM | POA: Diagnosis not present

## 2016-11-05 DIAGNOSIS — F339 Major depressive disorder, recurrent, unspecified: Secondary | ICD-10-CM | POA: Diagnosis not present

## 2016-11-05 DIAGNOSIS — E1149 Type 2 diabetes mellitus with other diabetic neurological complication: Secondary | ICD-10-CM

## 2016-11-05 DIAGNOSIS — J449 Chronic obstructive pulmonary disease, unspecified: Secondary | ICD-10-CM | POA: Diagnosis not present

## 2016-11-05 DIAGNOSIS — F32A Depression, unspecified: Secondary | ICD-10-CM | POA: Insufficient documentation

## 2016-11-05 NOTE — Progress Notes (Signed)
Location:   [facility] Room Number: 156 A Place of Service:  SNF (31)   CODE STATUS: Full Code  Allergies  Allergen Reactions  . Latex     Chief Complaint  Patient presents with  . Medical Management of Chronic Issues    1 month follow up    HPI:  He is a long term resident of this facility being seen for the management of his chronic illnesses. He is complaining of pain in his feet; neck and legs. He tells me that he was taking lyrica 300 mg twice daily with good pain management. He has no other complaints. There are no nursing concerns at this time.    Past Medical History:  Diagnosis Date  . Acute osteomyelitis of calcaneum, right ([place]) 10/02/2016  . Acute respiratory failure ([place])   . Anemia   . Chronic back pain   . COPD (chronic obstructive pulmonary disease) ([place])   . DDD (degenerative disc disease), lumbar   . Diabetes mellitus without complication ([place])   . Diabetic ulcer of toe of left foot ([place]) 10/02/2016  . 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 Foot drop, right 12/27/2015  . Hypertension   . MRSA bacteremia   . Osteomyelitis ([place])   . Osteomyelitis ([place])   . Urinary retention   . Urinary retention     Past Surgical History:  Procedure Laterality Date  . ANTERIOR CERVICAL CORPECTOMY N/A 11/25/2015   Procedure: ANTERIOR CERVICAL FIVE CORPECTOMY Cervical four - six fusion;  Surgeon: Consuella Lose, MD;  Location: [place] [facility] [place] [facility];  Service: Neurosurgery;  Laterality: N/A;  ANTERIOR CERVICAL FIVE CORPECTOMY Cervical four - six fusion  . POSTERIOR CERVICAL FUSION/FORAMINOTOMY N/A 11/29/2015   Procedure: Cervical Three-Cervical Seven Posterior Cervical Laminectomy with Fusion;  Surgeon: Consuella Lose, MD;  Location: [facility];  Service: Neurosurgery;  Laterality: N/A;  Cervical Three-Cervical Seven Posterior Cervical Laminectomy with Fusion    Social History   Social History  . Marital status: Single    Spouse name: N/A  . Number of children: N/A   . Years of education: N/A   Occupational History  . Not on file.   Social History Main Topics  . Smoking status: Former Smoker    Packs/day: 3.00    Types: Cigarettes  . Smokeless tobacco: Never Used  . Alcohol use No  . Drug use: No  . Sexual activity: Not on file   Other Topics Concern  . Not on file   Social History Narrative  . No narrative on file   Family History  Problem Relation Age of Onset  . Cancer Other   . Diabetes Other       VITAL SIGNS BP 119/61   Pulse 69   Temp 97.5 F (36.4 C)   Resp 16   Ht 6' (1.829 m)   Wt 215 lb (97.5 kg)   SpO2 91%   BMI 29.16 kg/m   Patient's Medications  New Prescriptions   No medications on file  Previous Medications   ALBUTEROL (PROVENTIL HFA;VENTOLIN HFA) 108 (90 BASE) MCG/ACT INHALER    Inhale 2 puffs into the lungs every 6 (six) hours as needed for wheezing or shortness of breath.   AMINO ACIDS-PROTEIN HYDROLYS (FEEDING SUPPLEMENT, PRO-STAT SUGAR FREE 64,) LIQD    Take 30 mLs by mouth 3 (three) times daily with meals.   BISACODYL (DULCOLAX) 10 MG SUPPOSITORY    Place 1 suppository (10 mg total) rectally daily as needed for moderate constipation.  BUPROPION HCL ER, XL, PO    Take by mouth. Give 450 mg by mouth daily   CALCIUM CARBONATE ANTACID (TUMS PO)    Take 2 tablets by mouth every 8 (eight) hours as needed.   DIAZEPAM (VALIUM) 5 MG TABLET    Take 1 tablet (5 mg total) by mouth every 8 (eight) hours as needed for anxiety or muscle spasms.   FLUTICASONE (FLONASE) 50 MCG/ACT NASAL SPRAY    Place 1 spray into both nostrils 2 (two) times daily.   GLUCAGON (GLUCAGON EMERGENCY) 1 MG INJECTION    Inject 1 mg into the muscle. Inject 1 mg intramuscular as needed for blood sugar less than 70   INSULIN LISPRO (HUMALOG) 100 UNIT/ML INJECTION    Inject 0-5 Units into the skin 3 (three) times daily before meals. If 0-149=0 units, 150-600=5 units   KETOCONAZOLE (NIZORAL) 2 % SHAMPOO    Apply 1 application topically. Apply to  face every day shift on tue and Friday.   MAGNESIUM OXIDE (MAG-OX) 400 MG TABLET    Take 400 mg by mouth 2 (two) times daily.   METFORMIN (GLUCOPHAGE) 500 MG TABLET    Take 1 tablet (500 mg total) by mouth 2 (two) times daily with a meal.   METOPROLOL TARTRATE (LOPRESSOR) 25 MG TABLET    Take 25 mg by mouth daily.   MULTIPLE VITAMINS-MINERALS (DECUBI-VITE PO)    Take 1 tablet by mouth daily.   OMEPRAZOLE (PRILOSEC) 20 MG CAPSULE    Take 20 mg by mouth daily.   OXYCODONE (OXYCONTIN) 60 MG 12 HR TABLET    Take one tablet by mouth every 12 hours for pain   OXYCODONE-ACETAMINOPHEN (ROXICET) 5-325 MG TABLET    Take 1 tablet by mouth every 8 (eight) hours as needed for moderate pain or severe pain. DO NOT EXCEED 3GM OF APAP IN 24 HOURS FROM ALL SOURCES   PAROXETINE (PAXIL) 40 MG TABLET    Take 40 mg by mouth every morning.   POLYETHYLENE GLYCOL (MIRALAX / GLYCOLAX) PACKET    Take 17 g by mouth daily. Hold if diarrhea   POTASSIUM CHLORIDE (K-DUR) 10 MEQ TABLET    Take 2 tablets (20 mEq total) by mouth daily.   PREGABALIN (LYRICA) 75 MG CAPSULE    Take 1 capsule (75 mg total) by mouth every 8 (eight) hours.   SENNA-DOCUSATE (SENOKOT-S) 8.6-50 MG TABLET    Take 2 tablets by mouth at bedtime.   SODIUM CHLORIDE (OCEAN) 0.65 % SOLN NASAL SPRAY    Place 1 spray into both nostrils as needed for congestion.   TAMSULOSIN (FLOMAX) 0.4 MG CAPS CAPSULE    Take 0.4 mg by mouth daily.    TIZANIDINE (ZANAFLEX) 2 MG CAPSULE    Take 1 capsule (2 mg total) by mouth 3 (three) times daily as needed for muscle spasms.   TORSEMIDE (DEMADEX) 20 MG TABLET    Take 20 mg by mouth 2 (two) times daily.   TRIAMCINOLONE CREAM (KENALOG) 0.1 %    Apply 1 application topically 2 (two) times daily.   VANCOMYCIN HCL IN DEXTROSE (VANCOCIN HCL IV)    Give 1 gram intravenously in the morning for Wound Infection   VITAMIN B-12 (CYANOCOBALAMIN) 1000 MCG TABLET    Take 1,000 mcg by mouth daily.  Modified Medications   No medications on file    Discontinued Medications     SIGNIFICANT DIAGNOSTIC EXAMS  11-07-15: EEG: This EEG is indicative of sedated sleep.  No focal or epileptiform  activity is seen  11-07-15: ct of head: 1. No acute intracranial pathology seen on CT. 2. Mild small vessel ischemic microangiopathy.   11-09-15: renal ultrasound: Normal kidneys bilaterally  11-10-15: ct of abdomen and pelvis: 1. No abscess identified in the abdomen or pelvis. 2. Inflammatory changes in the fat adjacent to the urinary bladder, compatible with the reported clinical history of urinary tract infection. 3. Probable vascular calcification versus tiny 2 mm nonobstructive calculus in the upper pole of the right kidney. 4. Normal appendix. 5. Splenomegaly. 6. Areas of atelectasis or resolving airspace consolidation in the lung bases bilaterally with trace right pleural effusion. 7. Atherosclerosis.  11-11-15: mri of thoracic and lumbar spine:  MRI THORACIC SPINE: No MR findings of infection within the thoracic spine. Moderate LEFT central T7-8 disc protrusion resulting in mild canal stenosis.   MRI LUMBAR SPINE: Grade 1 L4-5 anterolisthesis with severe L4-5 RIGHT facet arthropathy, inflammation versus infectious bone marrow changes and facet cyst versus abscess. Possible RIGHT psoas muscle myositis. Severe canal stenosis L3-4 and L4-5. Severe L4-5 and moderate to severe L3-4 neural foraminal narrowing.  11-11-15: mri of cervical spine: C5-6 discitis osteomyelitis with trace epidural abscess at this level. Associated paraspinal muscle edema/myositis. Severe canal stenosis at C5-6. C4-5 through C6-7 spinal cord edema/pre syrinx. No abnormal enhancement of the spinal cord. Moderate canal stenosis at C6-7, mild at C3-4 and C4-5. Multilevel neural foraminal narrowing: Severe at C5-6 and C6-7.  11-20-15: 2-d echo; - Left ventricle: The cavity size was normal. There was mild concentric hypertrophy. Systolic function was normal. The estimated  ejection fraction was in the range of 55% to 60%. Doppler parameters are consistent with abnormal left ventricular relaxation (grade 1 diastolic dysfunction). - Pericardium, extracardiac: A small pericardial effusion was identified anterior to the heart. There was no evidence of hemodynamic compromise.   11-26-15: chest x-ray: Low lung volumes with interstitial opacities, most likely atelectasis. No large pleural effusion or evidence of lobar pneumonia. Right upper extremity PICC. Since the prior plain film there has been removal of endotracheal tube and gastric tube.  11-28-15: cervical spine x-ray: Postoperative changes of C5 corpectomy with anterior plate and screw fixation of C4-C6. Note that C6 and below not well visualized secondary to overlying soft tissues. Compared to the intraoperative fluoroscopic image there is mild increased kyphosis. Significant prevertebral soft tissue swelling and gas, favored to be postoperative changes.  01-31-16: right lower extremity arterial doppler: mild atherosclerotic pvd with no hemodynamically significant stenosis and no occlusion.    07-24-16: bone biopsy: + osteomyelitis   10-02-16: right ankle x-ray: There is osteolysis and cortical irregularity posterior aspect of calcaneus highly suspicious for osteomyelitis. There is adjacent soft tissue irregularity probable nonhealing wound.   10-02-16: left foot x-ray: No objective evidence of osteomyelitis of the great toe but the AP view is nondiagnostic. There is soft tissue swelling of the toe consistent with cellulitis. There is a diffuse permeative pattern of the foot with subcentimeter focal areas of lucency which may reflect disuse osteoporosis but a systemic process such as multiple myeloma could produce similar findings.  10-14-26; right foot x-ray: Soft tissue swelling and ulceration over the calcaneus with adjacent calcaneal cortical erosions suggesting osteomyelitis.     LABS REVIEWED:   11-29-15: wbc  7.2; hgb 8.3; hct 27.6; mcv 85.4; plt 238; glucose 110; bun 12; creat 0.86; k+ 3.7; na++ 137 12-01-15: wbc 9.3; hgb 8.3; hct 27.3 mcv 83.5; plt 290; glucose 150; bun 9; creat 0.80; k+ 3.5; na++ 136;  sed rate 132 01-06-16: wbc 7.4; hgb 8.1; hct 25.6; mcv 83.9; plt 250; sed rate >130; CRP 2.8 01-09-16: glucose 71; bun 31; creat 1.74; k+ 4.4; na++ 143 01-17-16: hgb a1c 5.5; pre-albumin 22 02-10-16: glucose 110; bun 31; creat 1.81; k+ 3.9; na++ 137 03-14-16: urine culture: pseudomonas aeruginosa: cipro  04-18-16: urine micro-albumin 130.1  05-24-16: vit B 12: 255; mag 2.0; hgb a1c 6.2  07-27-16: CRP latex: reactive  08-08-16: wound culture: MRSA  08-14-16: wbc 7.7; hgb 8.3; hct 27.3; mcv 87.7; plt 241; glucose 95; bun 33.8; creat 2.26; k+ 4.3; na++ 139; liver normal albumin 3.7; hgb a1c 5.6  10-02-16: wbc 6.8; hgb 9.7; hct 31.3; mcv 86.2; plt 263; glucose 130; bun 36; creat 2.67; k+ 4.9; na++ 139; liver normal albumin 3.7; sed rate 84; CRP 30.6  10-05-16: glucose 91; bun 31.5; creat 2.01; k+ 4.9; na++ 136; ca 9.6; liver normal albumin 4.4 CRP 1.61; sed rate 86;  10-12-16: glucose 103; bun 40.6; creat 2.69'; k+ 4.7; na++ 141; ca 9.3  10-22-16: glucose 154; bun 25.7; creat 1.96; k+ 4.8; na++ 138; ca 9.0    Review of Systems  Constitutional: Negative for malaise/fatigue.  Respiratory: Negative for cough and shortness of breath.   Cardiovascular: Negative for chest pain, palpitations and leg swelling.  Gastrointestinal: Negative for heartburn, abdominal pain and constipation.  Musculoskeletal:  Has bilateral foot pain neck and leg pain   Skin negative  Neurological: Negative for dizziness.  Psychiatric/Behavioral: The patient is not nervous/anxious.     Physical Exam  Constitutional: He is oriented to person, place, and time. No distress.  Overweight   Eyes: Conjunctivae are normal.  Neck: Neck supple. No JVD present. No thyromegaly present.  Cardiovascular: Normal rate, regular rhythm and intact distal  pulses.   Respiratory: Effort normal and breath sounds normal. No respiratory distress. He has no wheezes.  GI: Soft. Bowel sounds are normal. He exhibits no distension. There is no tenderness.  Musculoskeletal: He exhibits no edema.  Able to move all extremities  Lymphadenopathy:    He has no cervical adenopathy.  Neurological: He is alert and oriented to person, place, and time.  Skin: Skin is warm and dry. He is not diaphoretic.  Right heel shear: 0.6 x 0.3 x 0.2 cm Left great toe: diabetic wound: 0.2 x 0.4 x 0.1 cm  Psychiatric: He has a normal mood and affect.     ASSESSMENT/ PLAN:  1. Diabetes: hgb a1c 5.6 ( previous 6.2)  will continue  metformin 500 mg twice daily humalog humalog 5 units for cbg >=150   2.  Chronic diastolic heart failure: has pulmonary hypertension:  EF 55-60% (11-20-15): will continue demadex 20 mg twice daily with k+ 20 meq daily   3. Hypertension:b/p 119/61 will continue lopressor 25 mg daily   4. COPD: will continue albuterol 2 puffs every 6 hours as needed  flonase twice daily   5. Constipation: will continue senna s 2 tabs daily miralax daily   6. Urinary retention: will continue flomax 0.4 mg daily   7. Severe cervical spinal stenosis: has chronic pain: will continue ; percocet 5/325 mg every 8 hours as needed; oxycontin 60 mg twice daily  has valuim 5 mg every 8 hours as needed for spasms; has zanaflex 2 mg every 8 hours as needed   Will increase lyrica to 150 mg three times daily   8. Gerd: will continue prilosec 20 mg daily   9. Depression: will continue paxil 40 mg daily and  wellbutrin xl 450 mg daily   10. 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   11. Urine retention: no foley; will continue flomax 0.4 mg daily   12.  CKD  stage III: bun 25.7 creat 1.96; will monitor   Will check hgb a1c lipids urine for micro-albumin    MD is aware of resident's narcotic use and is in agreement with current plan of care. We will attempt to wean resident as apropriate    Ok Edwards NP [facility]  Contact [phone] Monday through Friday 8am- 5pm  After hours call [phone]

## 2016-11-23 ENCOUNTER — Other Ambulatory Visit: Payer: Self-pay

## 2016-11-23 MED ORDER — OXYCODONE HCL ER 20 MG PO T12A: EXTENDED_RELEASE_TABLET | ORAL | 0 refills | Status: DC

## 2016-11-23 NOTE — Telephone Encounter (Signed)
RX faxed to [facility] @ [phone], phone number [phone] 

## 2016-11-26 ENCOUNTER — Other Ambulatory Visit: Payer: Self-pay | Admitting: *Deleted

## 2016-11-26 MED ORDER — OXYCODONE-ACETAMINOPHEN 5-325 MG PO TABS: ORAL_TABLET | ORAL | 0 refills | Status: DC

## 2016-11-26 NOTE — Telephone Encounter (Signed)
[facility] #: [phone] Fax#: [phone]  

## 2016-12-03 ENCOUNTER — Ambulatory Visit: Payer: Medicaid Other | Admitting: Infectious Disease

## 2016-12-10 ENCOUNTER — Ambulatory Visit: Payer: Medicaid Other | Admitting: Infectious Disease

## 2016-12-25 ENCOUNTER — Telehealth: Payer: Self-pay | Admitting: Internal Medicine

## 2016-12-25 ENCOUNTER — Telehealth: Payer: Self-pay | Admitting: *Deleted

## 2016-12-25 NOTE — Telephone Encounter (Signed)
Kenney Houseman, NP at [facility], is calling for advice on a urine culture/sensitivity. She states this is resistant to any medication she prescribes. She wants to know if patient's appointment can be moved up so this can be treated here. She also states he has a double lumen PICC with Vancomycin ordered and needs follow up for this.  Per chart review, patient was scheduled for follow up 6/25, but no-showed (per [place], he refused to leave the facility that day).  He was scheduled to follow up for osteomyelitis, not UTI, and was on oral antibiotics with the plan to stop and be sent to Dr Doran Durand for amputation.  It is noted that Dr Tommy Medal advised against vancomycin due to other health conditions.  RN asked Kenney Houseman to page on-call MD for urine c/s recommendations.  RN also asked Kenney Houseman to follow up with the physician who wrote the vancomycin orders.  Tonya asked for a call back number and the name of my manager.  Given. Landis Gandy, RN

## 2016-12-25 NOTE — Telephone Encounter (Signed)
I was called by a nurse at the [facility] skilled nursing facility today about Mr. Gregory [name] He was seen by my partners and 2017 for MRSA bacteremia and C-spine infection. He was last seen by Dr. Tommy Medal in April of this year with concerns for bilateral foot ulcers and possible osteomyelitis. Dr. Drucilla Schmidt commented that he should not receive vancomycin in the future because of his chronic kidney disease. Apparently he was started back on vancomycin recently by Dr. Rush Landmark) Gregory [name], the primary care provider at the nursing facility. He is also being followed by a wound care specialist who comes to the facility. The nurse was concerned that some cultures had been obtained that showed organisms resistant to vancomycin. I asked her to review this with [name] and the wound care specialist. If they feel they need our input I asked that they refer him back to our clinic.

## 2016-12-25 NOTE — Telephone Encounter (Signed)
As per my note. MY FIRM BELIEF is he should have had a CURATIVE amputation. The wound care MD called me about this patient and I believe he initiated the vancomycin. I would recommend DC vancomycin and the PICC line. As far as UTI. The diagnosis is only valid IF Gregory [name] has SYMPTOMS of dysuria, suprapubic pain, flank pain AND pyuria and a positive urine culture. I suspect he had a fever and a + culture but I am also becoming a bit jaded by seeing this as the usual course of action. I cannot move up the appt.He needs to see an ORTHOPEDIC FOOT SURGEON namely Dr. Doran Durand to whom I tried to refer the Gregory [name]

## 2017-01-12 ENCOUNTER — Emergency Department (HOSPITAL_COMMUNITY)
Admission: EM | Admit: 2017-01-12 | Discharge: 2017-01-12 | Disposition: A | Discharge status: Home / Self Care | Payer: Medicaid Other | Attending: Emergency Medicine | Admitting: Emergency Medicine

## 2017-01-12 ENCOUNTER — Encounter (HOSPITAL_COMMUNITY): Payer: Self-pay | Admitting: Emergency Medicine

## 2017-01-12 DIAGNOSIS — J449 Chronic obstructive pulmonary disease, unspecified: Secondary | ICD-10-CM | POA: Insufficient documentation

## 2017-01-12 DIAGNOSIS — Z79899 Other long term (current) drug therapy: Secondary | ICD-10-CM | POA: Diagnosis not present

## 2017-01-12 DIAGNOSIS — B372 Candidiasis of skin and nail: Secondary | ICD-10-CM | POA: Diagnosis not present

## 2017-01-12 DIAGNOSIS — E1149 Type 2 diabetes mellitus with other diabetic neurological complication: Secondary | ICD-10-CM | POA: Diagnosis not present

## 2017-01-12 DIAGNOSIS — I110 Hypertensive heart disease with heart failure: Secondary | ICD-10-CM | POA: Insufficient documentation

## 2017-01-12 DIAGNOSIS — Z794 Long term (current) use of insulin: Secondary | ICD-10-CM | POA: Diagnosis not present

## 2017-01-12 DIAGNOSIS — I5032 Chronic diastolic (congestive) heart failure: Secondary | ICD-10-CM | POA: Insufficient documentation

## 2017-01-12 DIAGNOSIS — Z87891 Personal history of nicotine dependence: Secondary | ICD-10-CM | POA: Diagnosis not present

## 2017-01-12 DIAGNOSIS — R300 Dysuria: Secondary | ICD-10-CM | POA: Diagnosis present

## 2017-01-12 DIAGNOSIS — N390 Urinary tract infection, site not specified: Secondary | ICD-10-CM | POA: Diagnosis not present

## 2017-01-12 DIAGNOSIS — Z91040 Latex allergy status: Secondary | ICD-10-CM | POA: Diagnosis not present

## 2017-01-12 LAB — URINALYSIS, ROUTINE W REFLEX MICROSCOPIC
BILIRUBIN URINE: NEGATIVE
Glucose, UA: NEGATIVE mg/dL
KETONES UR: NEGATIVE mg/dL
NITRITE: POSITIVE — AB
PROTEIN: NEGATIVE mg/dL
Specific Gravity, Urine: 1.006 (ref 1.005–1.030)
Squamous Epithelial / LPF: NONE SEEN
pH: 6 (ref 5.0–8.0)

## 2017-01-12 LAB — CBC WITH DIFFERENTIAL/PLATELET
BASOS ABS: 0 10*3/uL (ref 0.0–0.1)
BASOS PCT: 0 %
EOS ABS: 0.2 10*3/uL (ref 0.0–0.7)
EOS PCT: 2 %
HCT: 33.9 % — ABNORMAL LOW (ref 39.0–52.0)
Hemoglobin: 10.7 g/dL — ABNORMAL LOW (ref 13.0–17.0)
Lymphocytes Relative: 37 %
Lymphs Abs: 3.2 10*3/uL (ref 0.7–4.0)
MCH: 28.9 pg (ref 26.0–34.0)
MCHC: 31.6 g/dL (ref 30.0–36.0)
MCV: 91.6 fL (ref 78.0–100.0)
Monocytes Absolute: 0.5 10*3/uL (ref 0.1–1.0)
Monocytes Relative: 6 %
Neutro Abs: 4.7 10*3/uL (ref 1.7–7.7)
Neutrophils Relative %: 55 %
PLATELETS: 208 10*3/uL (ref 150–400)
RBC: 3.7 MIL/uL — AB (ref 4.22–5.81)
RDW: 14.5 % (ref 11.5–15.5)
WBC: 8.6 10*3/uL (ref 4.0–10.5)

## 2017-01-12 LAB — COMPREHENSIVE METABOLIC PANEL
ALT: 14 U/L — AB (ref 17–63)
AST: 17 U/L (ref 15–41)
Albumin: 3.7 g/dL (ref 3.5–5.0)
Alkaline Phosphatase: 77 U/L (ref 38–126)
Anion gap: 10 (ref 5–15)
BUN: 26 mg/dL — ABNORMAL HIGH (ref 6–20)
CHLORIDE: 98 mmol/L — AB (ref 101–111)
CO2: 28 mmol/L (ref 22–32)
CREATININE: 2.03 mg/dL — AB (ref 0.61–1.24)
Calcium: 9.2 mg/dL (ref 8.9–10.3)
GFR calc non Af Amer: 35 mL/min — ABNORMAL LOW (ref >60)
GFR, EST AFRICAN AMERICAN: 40 mL/min — AB (ref >60)
Glucose, Bld: 96 mg/dL (ref 65–99)
Potassium: 3.6 mmol/L (ref 3.5–5.1)
SODIUM: 136 mmol/L (ref 135–145)
Total Bilirubin: 0.5 mg/dL (ref 0.3–1.2)
Total Protein: 7.5 g/dL (ref 6.5–8.1)

## 2017-01-12 LAB — I-STAT CG4 LACTIC ACID, ED: Lactic Acid, Venous: 1.85 mmol/L (ref 0.5–1.9)

## 2017-01-12 LAB — LIPASE, BLOOD: LIPASE: 13 U/L (ref 11–51)

## 2017-01-12 MED ORDER — HEPARIN SOD (PORK) LOCK FLUSH 100 UNIT/ML IV SOLN
500.0000 [IU] | Freq: Once | INTRAVENOUS | Status: AC
  Administered 2017-01-12: 500 [IU]
  Filled 2017-01-12: qty 5

## 2017-01-12 MED ORDER — CEFPODOXIME PROXETIL 200 MG PO TABS
200.0000 mg | ORAL_TABLET | Freq: Two times a day (BID) | ORAL | 0 refills | Status: AC
Start: 2017-01-12 — End: 2017-01-22

## 2017-01-12 MED ORDER — CEFTRIAXONE SODIUM 2 G IJ SOLR
2.0000 g | Freq: Once | INTRAMUSCULAR | Status: AC
  Administered 2017-01-12: 2 g via INTRAVENOUS
  Filled 2017-01-12: qty 2

## 2017-01-12 MED ORDER — NYSTATIN 100000 UNIT/GM EX POWD: Freq: Three times a day (TID) | CUTANEOUS | 0 refills | Status: AC

## 2017-01-12 NOTE — Discharge Instructions (Signed)
Your urine culture was RESISTANT to Levaquin.  To help treat your infection, I have given a dose of ROCEPHIN 2 G INTRAVENOUSLY. It is important that you tell your doctor about the need to switch medications. Based on sensitivities, I have prescribed VANTIN, which should cover the infection. If you cannot eat/drink or tolerate PO antibiotics, tell your doctor to switch to an appropriate IV medication.

## 2017-01-12 NOTE — ED Provider Notes (Signed)
[facility] DEPT Provider Note   CSN: [id number] Arrival date & time: 01/12/17  1538     History   Chief Complaint Chief Complaint  Patient presents with  . Dysuria  . Diarrhea  . Pelvic Pain    HPI Gregory [name] is a 56 y.o. male.  HPI   56 year old male with past medical history of chronic urinary retention with indwelling Foley, recent enterococcal UTI on outpatient Levaquin, who presents with persistent lower abdominal pain as well as penile pain. The patient states that since starting his Levaquin 2 weeks ago, he has had progressively worsening lower abdominal pain with associated loose stools. He has associated nausea but no vomiting. His appetite has been poor. He feels that all the symptoms started after he started Levaquin. Over the last several days, he has had progressively worsening sharp, stabbing penile and groin pain. The pain is worse with palpation and movement. No alleviating factors. No known fevers or chills. No redness or pain at his PICC line.  Past Medical History:  Diagnosis Date  . Acute osteomyelitis of calcaneum, right ([place]) 10/02/2016  . Acute respiratory failure ([place])   . Anemia   . Chronic back pain   . COPD (chronic obstructive pulmonary disease) ([place])   . DDD (degenerative disc disease), lumbar   . Diabetes mellitus without complication ([place])   . Diabetic ulcer of toe of left foot ([place]) 10/02/2016  . 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 Foot drop, right 12/27/2015  . Hypertension   . MRSA bacteremia   . Osteomyelitis ([place])   . Osteomyelitis ([place])   . Urinary retention   . Urinary retention     Patient Active Problem List   Diagnosis Date Noted  . Depression, recurrent ([place]) 11/05/2016  . Acute osteomyelitis of calcaneum, right ([place]) 10/02/2016  . Diabetic ulcer of toe of left foot ([place]) 10/02/2016  . 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 Chronic pain 09/27/2016  . Dyslipidemia associated with type 2 diabetes mellitus ([place]) 06/06/2016  . GERD without  esophagitis 05/11/2016  . Hypertensive heart disease with congestive heart failure ([place]) 12/15/2015  . Spinal stenosis in cervical region 12/15/2015  . Type II diabetes mellitus with neurological manifestations ([place]) 12/15/2015  . Chronic diastolic CHF (congestive heart failure) ([place])   . Pulmonary hypertension ([place])   . Urinary retention   . Foot drop, bilateral   . Pressure ulcer 11/10/2015  . MRSA bacteremia 10/03/2015  . COPD (chronic obstructive pulmonary disease) ([place]) 10/03/2015    Past Surgical History:  Procedure Laterality Date  . ANTERIOR CERVICAL CORPECTOMY N/A 11/25/2015   Procedure: ANTERIOR CERVICAL FIVE CORPECTOMY Cervical four - six fusion;  Surgeon: Consuella Lose, MD;  Location: [facility];  Service: Neurosurgery;  Laterality: N/A;  ANTERIOR CERVICAL FIVE CORPECTOMY Cervical four - six fusion  . POSTERIOR CERVICAL FUSION/FORAMINOTOMY N/A 11/29/2015   Procedure: Cervical Three-Cervical Seven Posterior Cervical Laminectomy with Fusion;  Surgeon: Consuella Lose, MD;  Location: [facility];  Service: Neurosurgery;  Laterality: N/A;  Cervical Three-Cervical Seven Posterior Cervical Laminectomy with Fusion       Home Medications    Prior to Admission medications   Medication Sig Start Date End Date Taking? Authorizing Provider  albuterol (PROVENTIL HFA;VENTOLIN HFA) 108 (90 Base) MCG/ACT inhaler Inhale 2 puffs into the lungs every 6 (six) hours as needed for wheezing or shortness of breath.   Yes [provider]  Amino Acids-Protein Hydrolys (FEEDING SUPPLEMENT, PRO-STAT SUGAR FREE 64,) LIQD Take 30 mLs by mouth 3 (three) times  daily with meals.   Yes [provider]  bisacodyl (DULCOLAX) 10 MG suppository Place 1 suppository (10 mg total) rectally daily as needed for moderate constipation. 12/02/15  Yes [name], MD  BuPROPion HCl ER, XL, 450 MG TB24 Take 1 tablet by mouth daily.   Yes [provider]  calcium carbonate (TUMS - DOSED IN MG  ELEMENTAL CALCIUM) 500 MG chewable tablet Chew 2 tablets by mouth every 8 (eight) hours as needed for indigestion or heartburn.   Yes [provider]  diazepam (VALIUM) 5 MG tablet Take 1 tablet (5 mg total) by mouth every 8 (eight) hours as needed for anxiety or muscle spasms. 12/02/15  Yes [name], MD  fluticasone [facility]) 50 MCG/ACT nasal spray Place 1 spray into both nostrils 2 (two) times daily.   Yes [provider]  insulin lispro (HUMALOG) 100 UNIT/ML injection Inject 0-5 Units into the skin 3 (three) times daily before meals. If 0-149=0 units, 150-600=5 units   Yes [provider]  Levofloxacin (LEVAQUIN) 250 MG/50ML SOLN Inject 250 mg into the vein daily. For osteomyelitis x 6 weeks starting 7/12   Yes [provider]  linezolid (ZYVOX) 600 MG tablet Take 600 mg by mouth 2 (two) times daily. Starting 7/12 x 6 weeks   Yes [provider]  magnesium oxide (MAG-OX) 400 MG tablet Take 400 mg by mouth 2 (two) times daily.   Yes [provider]  metFORMIN (GLUCOPHAGE) 500 MG tablet Take 1 tablet (500 mg total) by mouth 2 (two) times daily with a meal. 12/02/15  Yes [name], MD  metoprolol tartrate (LOPRESSOR) 25 MG tablet Take 25 mg by mouth daily.   Yes [provider]  Multiple Vitamins-Minerals (DECUBI-VITE PO) Take 1 tablet by mouth daily.   Yes [provider]  nystatin (MYCOSTATIN/NYSTOP) powder Apply 1 g topically 2 (two) times daily. Apply to groin twice daily until healed   Yes [provider]  omeprazole (PRILOSEC) 20 MG capsule Take 20 mg by mouth daily.   Yes [provider]  oxyCODONE (OXYCONTIN) 60 MG 12 hr tablet Take one tablet by mouth every 12 hours for pain 10/19/16  Yes Lauree Chandler, NP  oxyCODONE-acetaminophen (ROXICET) 5-325 MG tablet Take one tablet by mouth every 8 hours as needed for pain. DO NOT EXCEED 3GM OF APAP IN 24 HOURS FROM ALL SOURCES 11/26/16  Yes Reed, Tiffany L, DO    PARoxetine (PAXIL) 40 MG tablet Take 40 mg by mouth every morning.   Yes [provider]  phenazopyridine (PYRIDIUM) 100 MG tablet Take 100 mg by mouth 3 (three) times daily as needed for pain.   Yes [provider]  polyethylene glycol (MIRALAX / GLYCOLAX) packet Take 17 g by mouth daily. Hold if diarrhea 12/02/15  Yes [name], MD  potassium chloride (K-DUR) 10 MEQ tablet Take 2 tablets (20 mEq total) by mouth daily. 12/02/15  Yes [name], MD  pregabalin (LYRICA) 75 MG capsule Take 1 capsule (75 mg total) by mouth every 8 (eight) hours. 12/02/15  Yes [name], MD  saccharomyces boulardii (FLORASTOR) 250 MG capsule Take 250 mg by mouth 2 (two) times daily.   Yes [provider]  senna-docusate (SENOKOT-S) 8.6-50 MG tablet Take 2 tablets by mouth at bedtime. 12/02/15  Yes [name], MD  sodium chloride (OCEAN) 0.65 % SOLN nasal spray Place 1 spray into both nostrils as needed for congestion. 12/02/15  Yes [name], MD  tamsulosin [facility]) 0.4  MG CAPS capsule Take 0.4 mg by mouth daily.  11/26/13  Yes [provider]  tizanidine (ZANAFLEX) 2 MG capsule Take 1 capsule (2 mg total) by mouth 3 (three) times daily as needed for muscle spasms. 12/02/15  Yes [name], MD  vitamin B-12 (CYANOCOBALAMIN) 1000 MCG tablet Take 1,000 mcg by mouth daily.   Yes [provider]  cefpodoxime (VANTIN) 200 MG tablet Take 1 tablet (200 mg total) by mouth 2 (two) times daily. 01/12/17 01/22/17  Duffy Bruce, MD  glucagon (GLUCAGON EMERGENCY) 1 MG injection Inject 1 mg into the muscle. Inject 1 mg intramuscular as needed for blood sugar less than 70    [provider]  nystatin (MYCOSTATIN/NYSTOP) powder Apply topically 3 (three) times daily. 01/12/17 01/22/17  Duffy Bruce, MD  Vancomycin HCl in Dextrose (VANCOCIN HCL IV) Give 1 gram intravenously in the morning for Wound Infection 10/30/16   [provider]    Family History Family History  Problem Relation Age  of Onset  . Cancer Other   . Diabetes Other     Social History Social History  Substance Use Topics  . Smoking status: Former Smoker    Packs/day: 3.00    Types: Cigarettes  . Smokeless tobacco: Never Used  . Alcohol use No     Allergies   Latex   Review of Systems Review of Systems  Constitutional: Positive for chills and fatigue. Negative for fever.  HENT: Negative for congestion and rhinorrhea.   Eyes: Negative for visual disturbance.  Respiratory: Negative for cough, shortness of breath and wheezing.   Cardiovascular: Negative for chest pain and leg swelling.  Gastrointestinal: Positive for abdominal pain and nausea. Negative for diarrhea and vomiting.  Genitourinary: Positive for penile pain and urgency. Negative for dysuria and flank pain.  Musculoskeletal: Negative for neck pain and neck stiffness.  Skin: Negative for rash and wound.  Allergic/Immunologic: Negative for immunocompromised state.  Neurological: Negative for syncope, weakness and headaches.  All other systems reviewed and are negative.    Physical Exam Updated Vital Signs BP 121/78   Pulse 60   Temp 98.4 F (36.9 C) (Oral)   Resp 18   Ht 6' (1.829 m)   Wt 99.8 kg (220 lb)   SpO2 96%   BMI 29.84 kg/m   Physical Exam  Constitutional: He is oriented to person, place, and time. He appears well-developed and well-nourished. No distress.  HENT:  Head: Normocephalic and atraumatic.  Eyes: Conjunctivae are normal.  Neck: Neck supple.  Cardiovascular: Normal rate, regular rhythm and normal heart sounds.  Exam reveals no friction rub.   No murmur heard. Pulmonary/Chest: Effort normal and breath sounds normal. No respiratory distress. He has no wheezes. He has no rales.  Abdominal: He exhibits no distension.  Genitourinary:  Genitourinary Comments: Foley catheter in place. Mildly erythematous, well demarcated rash in the bilateral inguinal creases with satellite lesions. No induration or  fluctuance. No scrotal tenderness or lesions.  Musculoskeletal: He exhibits no edema.  Neurological: He is alert and oriented to person, place, and time. He exhibits normal muscle tone.  Skin: Skin is warm. Capillary refill takes less than 2 seconds.  Psychiatric: He has a normal mood and affect.  Nursing note and vitals reviewed.    ED Treatments / Results  Labs (all labs ordered are listed, but only abnormal results are displayed) Labs Reviewed  CBC WITH DIFFERENTIAL/PLATELET - Abnormal; Notable for the following:       Result Value  RBC 3.70 (*)    Hemoglobin 10.7 (*)    HCT 33.9 (*)    All other components within normal limits  COMPREHENSIVE METABOLIC PANEL - Abnormal; Notable for the following:    Chloride 98 (*)    BUN 26 (*)    Creatinine, Ser 2.03 (*)    ALT 14 (*)    GFR calc non Af Amer 35 (*)    GFR calc Af Amer 40 (*)    All other components within normal limits  URINALYSIS, ROUTINE W REFLEX MICROSCOPIC - Abnormal; Notable for the following:    Hgb urine dipstick MODERATE (*)    Nitrite POSITIVE (*)    Leukocytes, UA MODERATE (*)    Bacteria, UA RARE (*)    All other components within normal limits  URINE CULTURE  LIPASE, BLOOD  I-STAT CG4 LACTIC ACID, ED  I-STAT CG4 LACTIC ACID, ED    EKG  EKG Interpretation None       Radiology No results found.  Procedures Procedures (including critical care time)  Medications Ordered in ED Medications  cefTRIAXone (ROCEPHIN) 2 g in dextrose 5 % 50 mL IVPB (0 g Intravenous Stopped 01/12/17 1927)  heparin lock flush 100 unit/mL (500 Units Intracatheter Given 01/12/17 1900)  heparin lock flush 100 unit/mL (500 Units Intracatheter Given 01/12/17 1900)     Initial Impression / Assessment and Plan / ED Course  I have reviewed the triage vital signs and the nursing notes.  Pertinent labs & imaging results that were available during my care of the patient were reviewed by me and considered in my medical decision  making (see chart for details).     56 year old male here with persistent penile and suprapubic pain. On arrival, vital signs are stable. Labwork is very reassuring with normal white count, normal lactic acid, and baseline renal function. No evidence of sepsis secondary to UTI. It is unclear whether his pain is secondary to his Foley versus candidal infection. However, urine culture does show signs of UTI. I obtained and reviewed culture results from pt's facility, which show that pt's Providencia is resistant to Levaquin as well as Enterococcus sensitive to Linezolid. Based on sensitivies, will switch to Rocephin/Vantin, start antifungals for intertrigo, and d/c home. Pt in agreement.  Final Clinical Impressions(s) / ED Diagnoses   Final diagnoses:  Complicated UTI (urinary tract infection)  Skin yeast infection    New Prescriptions Discharge Medication List as of 01/12/2017  6:10 PM    START taking these medications   Details  cefpodoxime (VANTIN) 200 MG tablet Take 1 tablet (200 mg total) by mouth 2 (two) times daily., Starting Sat 01/12/2017, Until Tue 01/22/2017, Print    nystatin (MYCOSTATIN/NYSTOP) powder Apply topically 3 (three) times daily., Starting Sat 01/12/2017, Until Tue 01/22/2017, Print         Duffy Bruce, MD 01/13/17 Dyann Kief

## 2017-01-12 NOTE — ED Notes (Signed)
Pt given instructions to alert PCP of tolerance to Levaquin and to have dressing re-checked and/or changed again once back at facility.  Pt verbalized understanding and left in wheelchair with daughter.  Denies any further questions.  A&Ox4.

## 2017-01-12 NOTE — ED Triage Notes (Signed)
Per PTAR, patient from [facility], c/o burning pelvic pain x2 weeks and diarrhea x4 days. Reports taking levaquin x2 weeks as prescribed. Patient has PICC line and foley catheter. Non ambulatory. Denies abdominal pain, N/V, chest pain and SOB.  BP 120/64 HR 65 RR 18 O2 92%

## 2017-01-15 LAB — URINE CULTURE: Culture: >=100000 — AB

## 2017-01-16 ENCOUNTER — Telehealth: Payer: Self-pay | Admitting: Emergency Medicine

## 2017-01-16 NOTE — Telephone Encounter (Signed)
Post ED Visit - Positive Culture Follow-up  Culture report reviewed by antimicrobial stewardship pharmacist:  []  Elenor Quinones, Pharm.D. []  Heide Guile, Pharm.D., BCPS AQ-ID []  Parks Neptune, Pharm.D., BCPS []  Alycia Rossetti, Pharm.D., BCPS []  [place], Pharm.D., BCPS, AAHIVP []  Legrand Como, Pharm.D., BCPS, AAHIVP []  Salome Arnt, PharmD, BCPS []  Dimitri Ped, PharmD, BCPS []  Vincenza Hews, PharmD, BCPS Jimmy Footman PharmD  Positive urine culture Treated with cefdodoxime, levofloxacin, organism sensitive to the same and no further patient follow-up is required at this time.  Hazle Nordmann 01/16/2017, 4:39 PM

## 2017-02-22 ENCOUNTER — Encounter (HOSPITAL_COMMUNITY): Payer: Self-pay | Admitting: Obstetrics and Gynecology

## 2017-02-22 ENCOUNTER — Emergency Department (HOSPITAL_COMMUNITY)
Admission: EM | Admit: 2017-02-22 | Discharge: 2017-02-22 | Disposition: A | Discharge status: Home / Self Care | Payer: Medicaid Other | Attending: Emergency Medicine | Admitting: Emergency Medicine

## 2017-02-22 DIAGNOSIS — I110 Hypertensive heart disease with heart failure: Secondary | ICD-10-CM | POA: Diagnosis not present

## 2017-02-22 DIAGNOSIS — E119 Type 2 diabetes mellitus without complications: Secondary | ICD-10-CM | POA: Diagnosis not present

## 2017-02-22 DIAGNOSIS — Z794 Long term (current) use of insulin: Secondary | ICD-10-CM | POA: Diagnosis not present

## 2017-02-22 DIAGNOSIS — L03213 Periorbital cellulitis: Secondary | ICD-10-CM

## 2017-02-22 DIAGNOSIS — J449 Chronic obstructive pulmonary disease, unspecified: Secondary | ICD-10-CM | POA: Diagnosis not present

## 2017-02-22 DIAGNOSIS — L509 Urticaria, unspecified: Secondary | ICD-10-CM | POA: Diagnosis not present

## 2017-02-22 DIAGNOSIS — Z87891 Personal history of nicotine dependence: Secondary | ICD-10-CM | POA: Insufficient documentation

## 2017-02-22 DIAGNOSIS — I5032 Chronic diastolic (congestive) heart failure: Secondary | ICD-10-CM | POA: Insufficient documentation

## 2017-02-22 DIAGNOSIS — Z79899 Other long term (current) drug therapy: Secondary | ICD-10-CM | POA: Insufficient documentation

## 2017-02-22 DIAGNOSIS — Z91040 Latex allergy status: Secondary | ICD-10-CM | POA: Diagnosis not present

## 2017-02-22 DIAGNOSIS — R21 Rash and other nonspecific skin eruption: Secondary | ICD-10-CM | POA: Insufficient documentation

## 2017-02-22 DIAGNOSIS — H02841 Edema of right upper eyelid: Secondary | ICD-10-CM | POA: Diagnosis present

## 2017-02-22 MED ORDER — DIPHENHYDRAMINE HCL 25 MG PO CAPS: 25.0000 mg | ORAL_CAPSULE | Freq: Four times a day (QID) | ORAL | 0 refills | Status: DC | PRN

## 2017-02-22 MED ORDER — CLINDAMYCIN HCL 300 MG PO CAPS
450.0000 mg | ORAL_CAPSULE | Freq: Once | ORAL | Status: AC
  Administered 2017-02-22: 17:00:00 450 mg via ORAL
  Filled 2017-02-22: qty 1

## 2017-02-22 MED ORDER — CLINDAMYCIN HCL 150 MG PO CAPS: 450.0000 mg | ORAL_CAPSULE | Freq: Three times a day (TID) | ORAL | 0 refills | Status: DC

## 2017-02-22 MED ORDER — DIPHENHYDRAMINE HCL 25 MG PO CAPS
25.0000 mg | ORAL_CAPSULE | Freq: Once | ORAL | Status: AC
  Administered 2017-02-22: 25 mg via ORAL
  Filled 2017-02-22: qty 1

## 2017-02-22 NOTE — ED Notes (Signed)
Pt is requesting to see MD again. Pt is upset that we have not given him anything to get rid of his hives.  MD made aware

## 2017-02-22 NOTE — ED Triage Notes (Signed)
Per EMS: Pt is coming from [facility] and rehab.  Pt has a rash on his trunk and arms. Pt is having itching on his hands. Pt is declining an IV No SOB, pt denies N/V/D Pt has an allergy to latex Pt has had no changes to his medications.  Pt has been at the facility for a year  Pt reported to nurse that he will not be "subjected to an IV"  Pt also has a wound on his eye that he would like looked at.

## 2017-02-22 NOTE — ED Notes (Signed)
ED Provider at bedside. 

## 2017-02-22 NOTE — ED Provider Notes (Signed)
[facility] DEPT Provider Note   CSN: [id number] Arrival date & time: 02/22/17  1424     History   Chief Complaint Chief Complaint  Patient presents with  . Urticaria  . Allergic Reaction  . Wound Check    HPI Gregory [name] is a 56 y.o. male.  The history is provided by the patient. No language interpreter was used.  Urticaria   Allergic Reaction  Wound Check     Gregory [name] is a 56 y.o. male who presents to the Emergency Department complaining of wound check, hives.  He presents from [facility] for evaluation of eye wound and hives.  He reports three days of swelling to the right eye.  He had a mole on the eyelid and scratched at it about a month ago and now has a wound there.  Initially there was swelling and he was treated with doxycycline, last dose 9/1.  Over the last three days his swelling/redness has returned.  He denies any pain, fevers, malaise.  He also reports diffuse itchy rash since yesterday.  He was started on prednisone and hydrocortisone for his itching. He denies any SOB.    Past Medical History:  Diagnosis Date  . Acute osteomyelitis of calcaneum, right ([place]) 10/02/2016  . Acute respiratory failure ([place])   . Anemia   . Chronic back pain   . COPD (chronic obstructive pulmonary disease) ([place])   . DDD (degenerative disc disease), lumbar   . Diabetes mellitus without complication ([place])   . Diabetic ulcer of toe of left foot ([place]) 10/02/2016  . 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 Foot drop, right 12/27/2015  . Hypertension   . MRSA bacteremia   . Osteomyelitis ([place])   . Osteomyelitis ([place])   . Urinary retention   . Urinary retention     Patient Active Problem List   Diagnosis Date Noted  . Depression, recurrent ([place]) 11/05/2016  . Acute osteomyelitis of calcaneum, right ([place]) 10/02/2016  . Diabetic ulcer of toe of left foot ([place]) 10/02/2016  . 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 Chronic pain 09/27/2016  . Dyslipidemia associated with type 2 diabetes mellitus  ([place]) 06/06/2016  . GERD without esophagitis 05/11/2016  . Hypertensive heart disease with congestive heart failure ([place]) 12/15/2015  . Spinal stenosis in cervical region 12/15/2015  . Type II diabetes mellitus with neurological manifestations ([place]) 12/15/2015  . Chronic diastolic CHF (congestive heart failure) ([place])   . Pulmonary hypertension ([place])   . Urinary retention   . Foot drop, bilateral   . Pressure ulcer 11/10/2015  . MRSA bacteremia 10/03/2015  . COPD (chronic obstructive pulmonary disease) ([place]) 10/03/2015    Past Surgical History:  Procedure Laterality Date  . ANTERIOR CERVICAL CORPECTOMY N/A 11/25/2015   Procedure: ANTERIOR CERVICAL FIVE CORPECTOMY Cervical four - six fusion;  Surgeon: Consuella Lose, MD;  Location: [facility];  Service: Neurosurgery;  Laterality: N/A;  ANTERIOR CERVICAL FIVE CORPECTOMY Cervical four - six fusion  . POSTERIOR CERVICAL FUSION/FORAMINOTOMY N/A 11/29/2015   Procedure: Cervical Three-Cervical Seven Posterior Cervical Laminectomy with Fusion;  Surgeon: Consuella Lose, MD;  Location: [facility];  Service: Neurosurgery;  Laterality: N/A;  Cervical Three-Cervical Seven Posterior Cervical Laminectomy with Fusion       Home Medications    Prior to Admission medications   Medication Sig Start Date End Date Taking? Authorizing Provider  BuPROPion HCl ER, XL, 450 MG TB24 Take 1 tablet by mouth daily.   Yes [provider]  fluticasone (FLONASE) 50 MCG/ACT nasal  spray Place 1 spray into both nostrils 2 (two) times daily.   Yes [provider]  magnesium oxide (MAG-OX) 400 MG tablet Take 400 mg by mouth 2 (two) times daily.   Yes [provider]  metFORMIN (GLUCOPHAGE) 500 MG tablet Take 1 tablet (500 mg total) by mouth 2 (two) times daily with a meal. 12/02/15  Yes [name], MD  metoprolol tartrate (LOPRESSOR) 25 MG tablet Take 25 mg by mouth daily.   Yes [provider]  Multiple Vitamins-Minerals  (DECUBI-VITE PO) Take 1 tablet by mouth daily.   Yes [provider]  omeprazole (PRILOSEC) 20 MG capsule Take 20 mg by mouth daily.   Yes [provider]  oxyCODONE (OXYCONTIN) 60 MG 12 hr tablet Take one tablet by mouth every 12 hours for pain 10/19/16  Yes Lauree Chandler, NP  PARoxetine (PAXIL) 40 MG tablet Take 40 mg by mouth every morning.   Yes [provider]  polyethylene glycol (MIRALAX / GLYCOLAX) packet Take 17 g by mouth daily. Hold if diarrhea 12/02/15  Yes [name], MD  potassium chloride (K-DUR) 10 MEQ tablet Take 2 tablets (20 mEq total) by mouth daily. 12/02/15  Yes [name], MD  senna-docusate (SENOKOT-S) 8.6-50 MG tablet Take 2 tablets by mouth at bedtime. 12/02/15  Yes [name], MD  tamsulosin (FLOMAX) 0.4 MG CAPS capsule Take 0.4 mg by mouth daily.  11/26/13  Yes [provider]  vitamin B-12 (CYANOCOBALAMIN) 1000 MCG tablet Take 1,000 mcg by mouth daily.   Yes [provider]  albuterol (PROVENTIL HFA;VENTOLIN HFA) 108 (90 Base) MCG/ACT inhaler Inhale 2 puffs into the lungs every 6 (six) hours as needed for wheezing or shortness of breath.    [provider]  Amino Acids-Protein Hydrolys (FEEDING SUPPLEMENT, PRO-STAT SUGAR FREE 64,) LIQD Take 30 mLs by mouth 3 (three) times daily with meals.    [provider]  bisacodyl (DULCOLAX) 10 MG suppository Place 1 suppository (10 mg total) rectally daily as needed for moderate constipation. 12/02/15   [name], MD  calcium carbonate (TUMS - DOSED IN MG ELEMENTAL CALCIUM) 500 MG chewable tablet Chew 2 tablets by mouth every 8 (eight) hours as needed for indigestion or heartburn.    [provider]  clindamycin (CLEOCIN) 150 MG capsule Take 3 capsules (450 mg total) by mouth 3 (three) times daily. 02/22/17   Quintella Reichert, MD  diazepam (VALIUM) 5 MG tablet Take 1 tablet (5 mg total) by mouth every 8 (eight) hours as needed for anxiety or muscle spasms. 12/02/15   [name],  MD  diphenhydrAMINE (BENADRYL) 25 mg capsule Take 1-2 capsules (25-50 mg total) by mouth every 6 (six) hours as needed. 02/22/17   Quintella Reichert, MD  glucagon (GLUCAGON EMERGENCY) 1 MG injection Inject 1 mg into the muscle. Inject 1 mg intramuscular as needed for blood sugar less than 70    [provider]  insulin lispro (HUMALOG) 100 UNIT/ML injection Inject 0-5 Units into the skin 3 (three) times daily before meals. If 0-149=0 units, 150-600=5 units    [provider]  Levofloxacin (LEVAQUIN) 250 MG/50ML SOLN Inject 250 mg into the vein daily. For osteomyelitis x 6 weeks starting 7/12    [provider]  linezolid (ZYVOX) 600 MG tablet Take 600 mg by mouth 2 (two) times daily. Starting 7/12 x 6 weeks    [provider]  nystatin (MYCOSTATIN/NYSTOP) powder Apply 1 g topically 2 (two) times daily. Apply to groin twice  daily until healed    [provider]  oxyCODONE-acetaminophen (ROXICET) 5-325 MG tablet Take one tablet by mouth every 8 hours as needed for pain. DO NOT EXCEED 3GM OF APAP IN 24 HOURS FROM ALL SOURCES 11/26/16   Reed, Tiffany L, DO  phenazopyridine (PYRIDIUM) 100 MG tablet Take 100 mg by mouth 3 (three) times daily as needed for pain.    [provider]  pregabalin (LYRICA) 75 MG capsule Take 1 capsule (75 mg total) by mouth every 8 (eight) hours. 12/02/15   [name], MD  saccharomyces boulardii (FLORASTOR) 250 MG capsule Take 250 mg by mouth 2 (two) times daily.    [provider]  sodium chloride (OCEAN) 0.65 % SOLN nasal spray Place 1 spray into both nostrils as needed for congestion. 12/02/15   [name], MD  tizanidine (ZANAFLEX) 2 MG capsule Take 1 capsule (2 mg total) by mouth 3 (three) times daily as needed for muscle spasms. 12/02/15   [name], MD  Vancomycin HCl in Dextrose (VANCOCIN HCL IV) Give 1 gram intravenously in the morning for Wound Infection 10/30/16   [provider]    Family History Family  History  Problem Relation Age of Onset  . Cancer Other   . Diabetes Other     Social History Social History  Substance Use Topics  . Smoking status: Former Smoker    Packs/day: 3.00    Types: Cigarettes  . Smokeless tobacco: Never Used  . Alcohol use No     Allergies   Latex   Review of Systems Review of Systems  All other systems reviewed and are negative.    Physical Exam Updated Vital Signs BP (!) 130/104   Pulse 83   Temp 98.4 F (36.9 C) (Oral)   Resp 16   SpO2 97%   Physical Exam  Constitutional: He is oriented to person, place, and time. He appears well-developed and well-nourished.  HENT:  Head: Normocephalic.  1cm ulcer to the right upper eyelid/eyebrow with moderate erythema and edema to upper eyelid. Able to open eyes bilaterally. EOMI.    Cardiovascular: Normal rate and regular rhythm.   No murmur heard. Pulmonary/Chest: Effort normal and breath sounds normal. No respiratory distress.  Abdominal: Soft. There is no tenderness. There is no rebound and no guarding.  Musculoskeletal: He exhibits no edema or tenderness.  Neurological: He is alert and oriented to person, place, and time.  Skin: Skin is warm and dry.  Diffuse urticaria  Psychiatric: He has a normal mood and affect. His behavior is normal.  Nursing note and vitals reviewed.    ED Treatments / Results  Labs (all labs ordered are listed, but only abnormal results are displayed) Labs Reviewed - No data to display  EKG  EKG Interpretation None       Radiology No results found.  Procedures Procedures (including critical care time)  Medications Ordered in ED Medications  clindamycin (CLEOCIN) capsule 450 mg (450 mg Oral Given 02/22/17 1643)  diphenhydrAMINE (BENADRYL) capsule 25 mg (25 mg Oral Given 02/22/17 1712)     Initial Impression / Assessment and Plan / ED Course  I have reviewed the triage vital signs and the nursing notes.  Pertinent labs & imaging results that were  available during my care of the patient were reviewed by me and considered in my medical decision making (see chart for details).     Pt here for evaluation of eyelid swelling and itchy rash.   1. Eyelid swelling -  concern for possible periorbital cellulitis, no concerning feature for orbital cellulitis.  Ulcer is concerning for basal cell carcinoma - patient has been seen by Dr. Katy Fitch with [facility] and states that he has referral for follow up.  Will treat with clindamycin with close return precautions.  Pt declined IV or imaging in ED.    2. Hives - urticarial rash to trunk, extremities, no evidence of anaphylaxis - unsure of offending agent.  Question doxycycline?  He was started on prednisone but do not feel this will be beneficial given his cellulitis and DM.  Discussed benadryl as needed for itching, return precautions.    Final Clinical Impressions(s) / ED Diagnoses   Final diagnoses:  Periorbital cellulitis of right eye  Urticaria    New Prescriptions New Prescriptions   CLINDAMYCIN (CLEOCIN) 150 MG CAPSULE    Take 3 capsules (450 mg total) by mouth 3 (three) times daily.   DIPHENHYDRAMINE (BENADRYL) 25 MG CAPSULE    Take 1-2 capsules (25-50 mg total) by mouth every 6 (six) hours as needed.     Quintella Reichert, MD 02/22/17 [phone]

## 2017-02-22 NOTE — Discharge Instructions (Signed)
You have a skin infection to your right eyelid. Get rechecked immediately if your swelling worsens, you develop severe eye pain, vision changes, fevers or any new concerning symptoms.  For your hives - please stop the prednisone.  You can take benadryl as needed for itching.    Get rechecked if you develop breathing problems.

## 2017-02-22 NOTE — ED Notes (Signed)
Bed: WHALA Expected date:  Expected time:  Means of arrival:  Comments: 

## 2017-02-22 NOTE — ED Notes (Signed)
Bed: WA04 Expected date:  Expected time:  Means of arrival:  Comments: EMS hives

## 2017-02-23 ENCOUNTER — Encounter (HOSPITAL_COMMUNITY): Payer: Self-pay | Admitting: Emergency Medicine

## 2017-02-23 ENCOUNTER — Emergency Department (HOSPITAL_COMMUNITY)
Admission: EM | Admit: 2017-02-23 | Discharge: 2017-02-23 | Disposition: A | Discharge status: Home / Self Care | Payer: Medicaid Other | Attending: Emergency Medicine | Admitting: Emergency Medicine

## 2017-02-23 DIAGNOSIS — Z79899 Other long term (current) drug therapy: Secondary | ICD-10-CM | POA: Insufficient documentation

## 2017-02-23 DIAGNOSIS — L299 Pruritus, unspecified: Secondary | ICD-10-CM | POA: Diagnosis not present

## 2017-02-23 DIAGNOSIS — Z91040 Latex allergy status: Secondary | ICD-10-CM | POA: Insufficient documentation

## 2017-02-23 DIAGNOSIS — L97529 Non-pressure chronic ulcer of other part of left foot with unspecified severity: Secondary | ICD-10-CM | POA: Insufficient documentation

## 2017-02-23 DIAGNOSIS — E1140 Type 2 diabetes mellitus with diabetic neuropathy, unspecified: Secondary | ICD-10-CM | POA: Insufficient documentation

## 2017-02-23 DIAGNOSIS — I5032 Chronic diastolic (congestive) heart failure: Secondary | ICD-10-CM | POA: Diagnosis not present

## 2017-02-23 DIAGNOSIS — Z87891 Personal history of nicotine dependence: Secondary | ICD-10-CM | POA: Insufficient documentation

## 2017-02-23 DIAGNOSIS — L509 Urticaria, unspecified: Secondary | ICD-10-CM | POA: Diagnosis present

## 2017-02-23 DIAGNOSIS — E11621 Type 2 diabetes mellitus with foot ulcer: Secondary | ICD-10-CM | POA: Diagnosis not present

## 2017-02-23 DIAGNOSIS — Z794 Long term (current) use of insulin: Secondary | ICD-10-CM | POA: Diagnosis not present

## 2017-02-23 DIAGNOSIS — I110 Hypertensive heart disease with heart failure: Secondary | ICD-10-CM | POA: Insufficient documentation

## 2017-02-23 HISTORY — DX: Foot drop, right foot: M21.371

## 2017-02-23 HISTORY — DX: Foot drop, right foot: M21.372

## 2017-02-23 HISTORY — DX: Hyperlipidemia, unspecified: E78.5

## 2017-02-23 HISTORY — DX: Basal cell carcinoma of skin of unspecified eyelid, including canthus: C44.111

## 2017-02-23 HISTORY — DX: Malignant (primary) neoplasm, unspecified: C80.1

## 2017-02-23 LAB — CBG MONITORING, ED: Glucose-Capillary: 134 mg/dL — ABNORMAL HIGH (ref 65–99)

## 2017-02-23 MED ORDER — DIPHENHYDRAMINE HCL 50 MG/ML IJ SOLN
50.0000 mg | Freq: Once | INTRAMUSCULAR | Status: AC
  Administered 2017-02-23: 50 mg via INTRAVENOUS
  Filled 2017-02-23: qty 1

## 2017-02-23 MED ORDER — METHYLPREDNISOLONE SODIUM SUCC 125 MG IJ SOLR
125.0000 mg | Freq: Once | INTRAMUSCULAR | Status: AC
  Administered 2017-02-23: 125 mg via INTRAVENOUS
  Filled 2017-02-23: qty 2

## 2017-02-23 MED ORDER — FAMOTIDINE IN NACL 20-0.9 MG/50ML-% IV SOLN
20.0000 mg | Freq: Once | INTRAVENOUS | Status: AC
  Administered 2017-02-23: 20 mg via INTRAVENOUS
  Filled 2017-02-23: qty 50

## 2017-02-23 MED ORDER — OXYCODONE HCL ER 10 MG PO T12A
60.0000 mg | EXTENDED_RELEASE_TABLET | Freq: Two times a day (BID) | ORAL | Status: DC
  Administered 2017-02-23: 60 mg via ORAL
  Filled 2017-02-23: qty 6

## 2017-02-23 MED ORDER — SODIUM CHLORIDE 0.9 % IV BOLUS (SEPSIS)
1000.0000 mL | Freq: Once | INTRAVENOUS | Status: AC
  Administered 2017-02-23: 1000 mL via INTRAVENOUS

## 2017-02-23 MED ORDER — DIPHENHYDRAMINE HCL 25 MG PO TABS: 25.0000 mg | ORAL_TABLET | Freq: Four times a day (QID) | ORAL | 0 refills | Status: DC

## 2017-02-23 MED ORDER — FAMOTIDINE 20 MG PO TABS: 20.0000 mg | ORAL_TABLET | Freq: Two times a day (BID) | ORAL | 0 refills | Status: DC

## 2017-02-23 MED ORDER — PREDNISONE 20 MG PO TABS: 40.0000 mg | ORAL_TABLET | Freq: Every day | ORAL | 0 refills | Status: AC

## 2017-02-23 NOTE — ED Notes (Signed)
Patient reports he is feeling much better. States he is not itching as bad and not as jumpy. Sitting on the side of the bed at this time. Will continue to monitor.

## 2017-02-23 NOTE — ED Notes (Signed)
PTAR called  

## 2017-02-23 NOTE — ED Provider Notes (Signed)
[facility] DEPT Provider Note   CSN: [id number] Arrival date & time: 02/23/17  0443     History   Chief Complaint Chief Complaint  Patient presents with  . Urticaria  . Pruritis    HPI Gregory [name] is a 56 y.o. male.  HPI Patient's a 56 year old male presents emergency department complaining of diffuse urticaria everywhere.  He states he is itching significantly.  Try the Benadryl which was prescribed yesterday without improvement in his symptoms.  He is in the ER yesterday and treated for allergic reaction also possible early developing periorbital cellulitis in the right.  He was discharged home on clindamycin.  At that time he was artery having urticaria-like symptoms.  He does not believe that though the Benadryl is helping.  No fevers or chills.  No change in his vision.  No difficulty breathing or swallowing.  No lightheadedness.  No other complaints.  Symptoms are moderate in severity   Past Medical History:  Diagnosis Date  . Acute osteomyelitis of calcaneum, right ([place]) 10/02/2016  . Acute respiratory failure ([place])   . Anemia   . Basal cell carcinoma, eyelid   . Cancer ([place])   . Chronic back pain   . COPD (chronic obstructive pulmonary disease) ([place])   . DDD (degenerative disc disease), lumbar   . Diabetes mellitus without complication ([place])   . Diabetic ulcer of toe of left foot ([place]) 10/02/2016  . 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 Foot drop, bilateral   . Foot drop, right 12/27/2015  . Hyperlipidemia   . Hypertension   . MRSA bacteremia   . Osteomyelitis ([place])   . Osteomyelitis ([place])   . Urinary retention   . Urinary retention     Patient Active Problem List   Diagnosis Date Noted  . Depression, recurrent ([place]) 11/05/2016  . Acute osteomyelitis of calcaneum, right ([place]) 10/02/2016  . Diabetic ulcer of toe of left foot ([place]) 10/02/2016  . 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 Chronic pain 09/27/2016  . Dyslipidemia associated with type 2 diabetes mellitus ([place])  06/06/2016  . GERD without esophagitis 05/11/2016  . Hypertensive heart disease with congestive heart failure ([place]) 12/15/2015  . Spinal stenosis in cervical region 12/15/2015  . Type II diabetes mellitus with neurological manifestations ([place]) 12/15/2015  . Chronic diastolic CHF (congestive heart failure) ([place])   . Pulmonary hypertension ([place])   . Urinary retention   . Foot drop, bilateral   . Pressure ulcer 11/10/2015  . MRSA bacteremia 10/03/2015  . COPD (chronic obstructive pulmonary disease) ([place]) 10/03/2015    Past Surgical History:  Procedure Laterality Date  . ANTERIOR CERVICAL CORPECTOMY N/A 11/25/2015   Procedure: ANTERIOR CERVICAL FIVE CORPECTOMY Cervical four - six fusion;  Surgeon: Consuella Lose, MD;  Location: [facility];  Service: Neurosurgery;  Laterality: N/A;  ANTERIOR CERVICAL FIVE CORPECTOMY Cervical four - six fusion  . POSTERIOR CERVICAL FUSION/FORAMINOTOMY N/A 11/29/2015   Procedure: Cervical Three-Cervical Seven Posterior Cervical Laminectomy with Fusion;  Surgeon: Consuella Lose, MD;  Location: [facility];  Service: Neurosurgery;  Laterality: N/A;  Cervical Three-Cervical Seven Posterior Cervical Laminectomy with Fusion       Home Medications    Prior to Admission medications   Medication Sig Start Date End Date Taking? Authorizing Provider  albuterol (PROVENTIL HFA;VENTOLIN HFA) 108 (90 Base) MCG/ACT inhaler Inhale 2 puffs into the lungs every 6 (six) hours as needed for wheezing or shortness of breath.    [provider]  Amino Acids-Protein Hydrolys (FEEDING SUPPLEMENT, PRO-STAT SUGAR  FREE 64,) LIQD Take 30 mLs by mouth 3 (three) times daily with meals.    [provider]  bisacodyl (DULCOLAX) 10 MG suppository Place 1 suppository (10 mg total) rectally daily as needed for moderate constipation. 12/02/15   [name], MD  BuPROPion HCl ER, XL, 450 MG TB24 Take 1 tablet by mouth daily.    [provider]  calcium carbonate  (TUMS - DOSED IN MG ELEMENTAL CALCIUM) 500 MG chewable tablet Chew 2 tablets by mouth every 8 (eight) hours as needed for indigestion or heartburn.    [provider]  clindamycin (CLEOCIN) 150 MG capsule Take 3 capsules (450 mg total) by mouth 3 (three) times daily. 02/22/17   Quintella Reichert, MD  diazepam (VALIUM) 5 MG tablet Take 1 tablet (5 mg total) by mouth every 8 (eight) hours as needed for anxiety or muscle spasms. 12/02/15   [name], MD  diphenhydrAMINE (BENADRYL) 25 MG tablet Take 1 tablet (25 mg total) by mouth every 6 (six) hours. 02/23/17   Jola Schmidt, MD  famotidine (PEPCID) 20 MG tablet Take 1 tablet (20 mg total) by mouth 2 (two) times daily. 02/23/17   Jola Schmidt, MD  fluticasone (FLONASE) 50 MCG/ACT nasal spray Place 1 spray into both nostrils 2 (two) times daily.    [provider]  glucagon (GLUCAGON EMERGENCY) 1 MG injection Inject 1 mg into the muscle. Inject 1 mg intramuscular as needed for blood sugar less than 70    [provider]  insulin lispro (HUMALOG) 100 UNIT/ML injection Inject 0-5 Units into the skin 3 (three) times daily before meals. If 0-149=0 units, 150-600=5 units    [provider]  Levofloxacin (LEVAQUIN) 250 MG/50ML SOLN Inject 250 mg into the vein daily. For osteomyelitis x 6 weeks starting 7/12    [provider]  linezolid (ZYVOX) 600 MG tablet Take 600 mg by mouth 2 (two) times daily. Starting 7/12 x 6 weeks    [provider]  magnesium oxide (MAG-OX) 400 MG tablet Take 400 mg by mouth 2 (two) times daily.    [provider]  metFORMIN (GLUCOPHAGE) 500 MG tablet Take 1 tablet (500 mg total) by mouth 2 (two) times daily with a meal. 12/02/15   [name], MD  metoprolol tartrate (LOPRESSOR) 25 MG tablet Take 25 mg by mouth daily.    [provider]  Multiple Vitamins-Minerals (DECUBI-VITE PO) Take 1 tablet by mouth daily.    [provider]  nystatin (MYCOSTATIN/NYSTOP) powder  Apply 1 g topically 2 (two) times daily. Apply to groin twice daily until healed    [provider]  omeprazole (PRILOSEC) 20 MG capsule Take 20 mg by mouth daily.    [provider]  oxyCODONE (OXYCONTIN) 60 MG 12 hr tablet Take one tablet by mouth every 12 hours for pain 10/19/16   Lauree Chandler, NP  oxyCODONE-acetaminophen (ROXICET) 5-325 MG tablet Take one tablet by mouth every 8 hours as needed for pain. DO NOT EXCEED 3GM OF APAP IN 24 HOURS FROM ALL SOURCES 11/26/16   Reed, Tiffany L, DO  PARoxetine (PAXIL) 40 MG tablet Take 40 mg by mouth every morning.    [provider]  phenazopyridine (PYRIDIUM) 100 MG tablet Take 100 mg by mouth 3 (three) times daily as needed for pain.    [provider]  polyethylene glycol (MIRALAX / GLYCOLAX) packet Take 17 g by mouth daily. Hold if diarrhea 12/02/15   [name], MD  potassium chloride (  K-DUR) 10 MEQ tablet Take 2 tablets (20 mEq total) by mouth daily. 12/02/15   [name], MD  predniSONE (DELTASONE) 20 MG tablet Take 2 tablets (40 mg total) by mouth daily. 02/23/17 02/28/17  Jola Schmidt, MD  pregabalin (LYRICA) 75 MG capsule Take 1 capsule (75 mg total) by mouth every 8 (eight) hours. 12/02/15   [name], MD  saccharomyces boulardii (FLORASTOR) 250 MG capsule Take 250 mg by mouth 2 (two) times daily.    [provider]  senna-docusate (SENOKOT-S) 8.6-50 MG tablet Take 2 tablets by mouth at bedtime. 12/02/15   [name], MD  sodium chloride (OCEAN) 0.65 % SOLN nasal spray Place 1 spray into both nostrils as needed for congestion. 12/02/15   [name], MD  tamsulosin (FLOMAX) 0.4 MG CAPS capsule Take 0.4 mg by mouth daily.  11/26/13   [provider]  tizanidine (ZANAFLEX) 2 MG capsule Take 1 capsule (2 mg total) by mouth 3 (three) times daily as needed for muscle spasms. 12/02/15   [name], MD  Vancomycin HCl in Dextrose (VANCOCIN HCL IV) Give 1 gram intravenously in the morning for Wound Infection 10/30/16    [provider]  vitamin B-12 (CYANOCOBALAMIN) 1000 MCG tablet Take 1,000 mcg by mouth daily.    [provider]    Family History Family History  Problem Relation Age of Onset  . Cancer Other   . Diabetes Other     Social History Social History  Substance Use Topics  . Smoking status: Former Smoker    Packs/day: 3.00    Types: Cigarettes  . Smokeless tobacco: Never Used  . Alcohol use No     Allergies   Latex   Review of Systems Review of Systems  All other systems reviewed and are negative.    Physical Exam Updated Vital Signs BP 136/79 (BP Location: Right Arm)   Pulse 87   Temp 98.1 F (36.7 C) (Oral)   Resp 20   SpO2 96%   Physical Exam  Constitutional: He is oriented to person, place, and time. He appears well-developed and well-nourished.  HENT:  Head: Normocephalic.  Eyes: EOM are normal.  Neck: Normal range of motion.  Cardiovascular: Normal rate and regular rhythm.   Pulmonary/Chest: Effort normal and breath sounds normal. He has no wheezes.  Abdominal: He exhibits no distension.  Musculoskeletal: Normal range of motion.  Neurological: He is alert and oriented to person, place, and time.  Skin:  Diffuse urticaria of extremities, abdomen, chest, back  Psychiatric: He has a normal mood and affect.  Nursing note and vitals reviewed.    ED Treatments / Results  Labs (all labs ordered are listed, but only abnormal results are displayed) Labs Reviewed  CBG MONITORING, ED - Abnormal; Notable for the following:       Result Value   Glucose-Capillary 134 (*)    All other components within normal limits    EKG  EKG Interpretation None       Radiology No results found.  Procedures Procedures (including critical care time)  Medications Ordered in ED Medications  oxyCODONE (OXYCONTIN) 12 hr tablet 60 mg (60 mg Oral Given 02/23/17 0541)  diphenhydrAMINE (BENADRYL) injection 50 mg (50 mg Intravenous Given 02/23/17 0546)    famotidine (PEPCID) IVPB 20 mg premix (0 mg Intravenous Stopped 02/23/17 0620)  sodium chloride 0.9 % bolus 1,000 mL (0 mLs Intravenous Stopped 02/23/17 0711)  methylPREDNISolone sodium succinate (SOLU-MEDROL) 125 mg/2 mL injection 125 mg (125 mg Intravenous  Given 02/23/17 0541)     Initial Impression / Assessment and Plan / ED Course  I have reviewed the triage vital signs and the nursing notes.  Pertinent labs & imaging results that were available during my care of the patient were reviewed by me and considered in my medical decision making (see chart for details).     7:15 AM Feels much better this time.  Urticaria significantly improving.  Patient reports itching is much better.  No significant erythema or swelling of the right periorbital region.  He will continue his antibiotics at home.  Home with a short course of steroids Benadryl Pepcid.  He understands to return to the ER for new or worsening symptoms  Final Clinical Impressions(s) / ED Diagnoses   Final diagnoses:  Urticaria    New Prescriptions New Prescriptions   DIPHENHYDRAMINE (BENADRYL) 25 MG TABLET    Take 1 tablet (25 mg total) by mouth every 6 (six) hours.   FAMOTIDINE (PEPCID) 20 MG TABLET    Take 1 tablet (20 mg total) by mouth 2 (two) times daily.   PREDNISONE (DELTASONE) 20 MG TABLET    Take 2 tablets (40 mg total) by mouth daily.     Jola Schmidt, MD 02/23/17 [phone]

## 2017-02-23 NOTE — ED Triage Notes (Signed)
Pt BIB EMS from [facility] with complaints of hives and itching that began 3 days ago. Patient was seen yesterday 02/22/2017 for the same. Patient was given benedryl but refused IV. Patient coming in today with same complaint.

## 2017-02-23 NOTE — ED Notes (Signed)
Report given to [facility]

## 2017-03-13 ENCOUNTER — Ambulatory Visit: Payer: Medicaid Other | Admitting: Infectious Disease

## 2017-03-25 ENCOUNTER — Inpatient Hospital Stay (HOSPITAL_COMMUNITY)
Admission: EM | Admit: 2017-03-25 | Discharge: 2017-04-02 | DRG: 617 | Disposition: A | Discharge status: Skilled Nursing Facility | Payer: Medicaid Other | Attending: Family Medicine | Admitting: Family Medicine

## 2017-03-25 ENCOUNTER — Encounter (HOSPITAL_COMMUNITY): Payer: Self-pay | Admitting: Emergency Medicine

## 2017-03-25 ENCOUNTER — Emergency Department (HOSPITAL_COMMUNITY): Payer: Medicaid Other

## 2017-03-25 DIAGNOSIS — C4490 Unspecified malignant neoplasm of skin, unspecified: Secondary | ICD-10-CM

## 2017-03-25 DIAGNOSIS — M21371 Foot drop, right foot: Secondary | ICD-10-CM | POA: Diagnosis present

## 2017-03-25 DIAGNOSIS — E1169 Type 2 diabetes mellitus with other specified complication: Secondary | ICD-10-CM | POA: Diagnosis present

## 2017-03-25 DIAGNOSIS — F339 Major depressive disorder, recurrent, unspecified: Secondary | ICD-10-CM | POA: Diagnosis not present

## 2017-03-25 DIAGNOSIS — L97529 Non-pressure chronic ulcer of other part of left foot with unspecified severity: Secondary | ICD-10-CM | POA: Diagnosis present

## 2017-03-25 DIAGNOSIS — E11621 Type 2 diabetes mellitus with foot ulcer: Secondary | ICD-10-CM | POA: Diagnosis present

## 2017-03-25 DIAGNOSIS — F39 Unspecified mood [affective] disorder: Secondary | ICD-10-CM | POA: Diagnosis present

## 2017-03-25 DIAGNOSIS — E785 Hyperlipidemia, unspecified: Secondary | ICD-10-CM | POA: Diagnosis present

## 2017-03-25 DIAGNOSIS — L97519 Non-pressure chronic ulcer of other part of right foot with unspecified severity: Secondary | ICD-10-CM | POA: Diagnosis present

## 2017-03-25 DIAGNOSIS — Z794 Long term (current) use of insulin: Secondary | ICD-10-CM | POA: Diagnosis not present

## 2017-03-25 DIAGNOSIS — M21372 Foot drop, left foot: Secondary | ICD-10-CM | POA: Diagnosis present

## 2017-03-25 DIAGNOSIS — E1121 Type 2 diabetes mellitus with diabetic nephropathy: Secondary | ICD-10-CM | POA: Diagnosis present

## 2017-03-25 DIAGNOSIS — E1149 Type 2 diabetes mellitus with other diabetic neurological complication: Secondary | ICD-10-CM | POA: Diagnosis present

## 2017-03-25 DIAGNOSIS — E1122 Type 2 diabetes mellitus with diabetic chronic kidney disease: Secondary | ICD-10-CM | POA: Diagnosis present

## 2017-03-25 DIAGNOSIS — Z87891 Personal history of nicotine dependence: Secondary | ICD-10-CM

## 2017-03-25 DIAGNOSIS — M79671 Pain in right foot: Secondary | ICD-10-CM | POA: Diagnosis present

## 2017-03-25 DIAGNOSIS — K5900 Constipation, unspecified: Secondary | ICD-10-CM | POA: Diagnosis present

## 2017-03-25 DIAGNOSIS — M86671 Other chronic osteomyelitis, right ankle and foot: Secondary | ICD-10-CM | POA: Diagnosis present

## 2017-03-25 DIAGNOSIS — G8929 Other chronic pain: Secondary | ICD-10-CM | POA: Diagnosis present

## 2017-03-25 DIAGNOSIS — Z79899 Other long term (current) drug therapy: Secondary | ICD-10-CM | POA: Diagnosis not present

## 2017-03-25 DIAGNOSIS — J449 Chronic obstructive pulmonary disease, unspecified: Secondary | ICD-10-CM | POA: Diagnosis present

## 2017-03-25 DIAGNOSIS — I129 Hypertensive chronic kidney disease with stage 1 through stage 4 chronic kidney disease, or unspecified chronic kidney disease: Secondary | ICD-10-CM | POA: Diagnosis present

## 2017-03-25 DIAGNOSIS — E1151 Type 2 diabetes mellitus with diabetic peripheral angiopathy without gangrene: Secondary | ICD-10-CM | POA: Diagnosis present

## 2017-03-25 DIAGNOSIS — M86171 Other acute osteomyelitis, right ankle and foot: Secondary | ICD-10-CM | POA: Diagnosis present

## 2017-03-25 DIAGNOSIS — N182 Chronic kidney disease, stage 2 (mild): Secondary | ICD-10-CM | POA: Diagnosis present

## 2017-03-25 DIAGNOSIS — F32A Depression, unspecified: Secondary | ICD-10-CM | POA: Diagnosis present

## 2017-03-25 DIAGNOSIS — F329 Major depressive disorder, single episode, unspecified: Secondary | ICD-10-CM | POA: Diagnosis present

## 2017-03-25 DIAGNOSIS — Z7951 Long term (current) use of inhaled steroids: Secondary | ICD-10-CM

## 2017-03-25 DIAGNOSIS — C4339 Malignant melanoma of other parts of face: Secondary | ICD-10-CM | POA: Diagnosis present

## 2017-03-25 DIAGNOSIS — M86672 Other chronic osteomyelitis, left ankle and foot: Secondary | ICD-10-CM | POA: Diagnosis present

## 2017-03-25 DIAGNOSIS — Z833 Family history of diabetes mellitus: Secondary | ICD-10-CM

## 2017-03-25 DIAGNOSIS — M869 Osteomyelitis, unspecified: Secondary | ICD-10-CM | POA: Diagnosis present

## 2017-03-25 DIAGNOSIS — D638 Anemia in other chronic diseases classified elsewhere: Secondary | ICD-10-CM | POA: Diagnosis present

## 2017-03-25 DIAGNOSIS — M86472 Chronic osteomyelitis with draining sinus, left ankle and foot: Secondary | ICD-10-CM | POA: Diagnosis not present

## 2017-03-25 DIAGNOSIS — E119 Type 2 diabetes mellitus without complications: Secondary | ICD-10-CM | POA: Diagnosis present

## 2017-03-25 DIAGNOSIS — Z8614 Personal history of Methicillin resistant Staphylococcus aureus infection: Secondary | ICD-10-CM

## 2017-03-25 LAB — COMPREHENSIVE METABOLIC PANEL
ALBUMIN: 3.2 g/dL — AB (ref 3.5–5.0)
ALK PHOS: 90 U/L (ref 38–126)
ALT: 19 U/L (ref 17–63)
AST: 30 U/L (ref 15–41)
Anion gap: 12 (ref 5–15)
BILIRUBIN TOTAL: 0.7 mg/dL (ref 0.3–1.2)
BUN: 20 mg/dL (ref 6–20)
CALCIUM: 9.2 mg/dL (ref 8.9–10.3)
CO2: 29 mmol/L (ref 22–32)
CREATININE: 1.93 mg/dL — AB (ref 0.61–1.24)
Chloride: 94 mmol/L — ABNORMAL LOW (ref 101–111)
GFR calc Af Amer: 43 mL/min — ABNORMAL LOW (ref >60)
GFR calc non Af Amer: 37 mL/min — ABNORMAL LOW (ref >60)
GLUCOSE: 146 mg/dL — AB (ref 65–99)
Potassium: 4.4 mmol/L (ref 3.5–5.1)
SODIUM: 135 mmol/L (ref 135–145)
Total Protein: 7.7 g/dL (ref 6.5–8.1)

## 2017-03-25 LAB — CBC WITH DIFFERENTIAL/PLATELET
BASOS PCT: 1 %
Basophils Absolute: 0 10*3/uL (ref 0.0–0.1)
EOS ABS: 0.2 10*3/uL (ref 0.0–0.7)
Eosinophils Relative: 2 %
HEMATOCRIT: 33.1 % — AB (ref 39.0–52.0)
HEMOGLOBIN: 10.2 g/dL — AB (ref 13.0–17.0)
LYMPHS ABS: 2.1 10*3/uL (ref 0.7–4.0)
Lymphocytes Relative: 26 %
MCH: 26.7 pg (ref 26.0–34.0)
MCHC: 30.8 g/dL (ref 30.0–36.0)
MCV: 86.6 fL (ref 78.0–100.0)
Monocytes Absolute: 0.6 10*3/uL (ref 0.1–1.0)
Monocytes Relative: 8 %
NEUTROS ABS: 4.9 10*3/uL (ref 1.7–7.7)
NEUTROS PCT: 63 %
Platelets: 326 10*3/uL (ref 150–400)
RBC: 3.82 MIL/uL — AB (ref 4.22–5.81)
RDW: 16.2 % — ABNORMAL HIGH (ref 11.5–15.5)
WBC: 7.8 10*3/uL (ref 4.0–10.5)

## 2017-03-25 LAB — GLUCOSE, CAPILLARY: Glucose-Capillary: 154 mg/dL — ABNORMAL HIGH (ref 65–99)

## 2017-03-25 LAB — C-REACTIVE PROTEIN: CRP: 4.9 mg/dL — AB (ref <1.0)

## 2017-03-25 LAB — SEDIMENTATION RATE: SED RATE: 120 mm/h — AB (ref 0–16)

## 2017-03-25 LAB — HEMOGLOBIN A1C
Hgb A1c MFr Bld: 7.2 % — ABNORMAL HIGH (ref 4.8–5.6)
Mean Plasma Glucose: 159.94 mg/dL

## 2017-03-25 LAB — PREALBUMIN: PREALBUMIN: 23.3 mg/dL (ref 18–38)

## 2017-03-25 MED ORDER — HYDROXYZINE HCL 25 MG PO TABS: 25.0000 mg | ORAL_TABLET | Freq: Four times a day (QID) | ORAL | Status: DC | PRN

## 2017-03-25 MED ORDER — VITAMIN D 1000 UNITS PO TABS
5000.0000 [IU] | ORAL_TABLET | Freq: Every day | ORAL | Status: DC
  Administered 2017-03-26 – 2017-04-02 (×8): 5000 [IU] via ORAL
  Filled 2017-03-25 (×9): qty 5

## 2017-03-25 MED ORDER — MAGNESIUM OXIDE 400 (241.3 MG) MG PO TABS
400.0000 mg | ORAL_TABLET | Freq: Two times a day (BID) | ORAL | Status: DC
  Administered 2017-03-25 – 2017-04-02 (×16): 400 mg via ORAL
  Filled 2017-03-25 (×16): qty 1

## 2017-03-25 MED ORDER — CYCLOBENZAPRINE HCL 10 MG PO TABS
10.0000 mg | ORAL_TABLET | Freq: Three times a day (TID) | ORAL | Status: DC | PRN
  Administered 2017-03-25 – 2017-04-02 (×12): 10 mg via ORAL
  Filled 2017-03-25 (×12): qty 1

## 2017-03-25 MED ORDER — ALBUTEROL SULFATE (2.5 MG/3ML) 0.083% IN NEBU
3.0000 mL | INHALATION_SOLUTION | Freq: Four times a day (QID) | RESPIRATORY_TRACT | Status: DC | PRN
Start: 2017-03-25 — End: 2017-04-02

## 2017-03-25 MED ORDER — VANCOMYCIN HCL 10 G IV SOLR
2000.0000 mg | Freq: Once | INTRAVENOUS | Status: AC
  Administered 2017-03-25: 2000 mg via INTRAVENOUS
  Filled 2017-03-25: qty 2000

## 2017-03-25 MED ORDER — GABAPENTIN 400 MG PO CAPS
400.0000 mg | ORAL_CAPSULE | Freq: Three times a day (TID) | ORAL | Status: DC
  Administered 2017-03-25 – 2017-03-29 (×11): 400 mg via ORAL
  Filled 2017-03-25 (×13): qty 1

## 2017-03-25 MED ORDER — VITAMIN B-12 1000 MCG PO TABS: 1000.0000 ug | ORAL_TABLET | Freq: Every day | ORAL | Status: DC

## 2017-03-25 MED ORDER — LORAZEPAM 1 MG PO TABS
1.0000 mg | ORAL_TABLET | Freq: Once | ORAL | Status: AC | PRN
  Administered 2017-03-26: 1 mg via ORAL
  Filled 2017-03-25: qty 1

## 2017-03-25 MED ORDER — TRAZODONE HCL 50 MG PO TABS
50.0000 mg | ORAL_TABLET | Freq: Every day | ORAL | Status: DC
  Administered 2017-03-25 – 2017-04-01 (×8): 50 mg via ORAL
  Filled 2017-03-25 (×8): qty 1

## 2017-03-25 MED ORDER — OXYCODONE HCL 5 MG PO TABS
5.0000 mg | ORAL_TABLET | Freq: Once | ORAL | Status: AC
  Administered 2017-03-25: 5 mg via ORAL
  Filled 2017-03-25: qty 1

## 2017-03-25 MED ORDER — VITAMINS A & D EX OINT
1.0000 "application " | TOPICAL_OINTMENT | Freq: Three times a day (TID) | CUTANEOUS | Status: DC
  Administered 2017-03-25 – 2017-04-02 (×13): 1 via TOPICAL
  Filled 2017-03-25: qty 1
  Filled 2017-03-25: qty 56.7
  Filled 2017-03-25 (×20): qty 1

## 2017-03-25 MED ORDER — HYDROMORPHONE HCL 1 MG/ML IJ SOLN
1.0000 mg | Freq: Once | INTRAMUSCULAR | Status: AC
  Administered 2017-03-25: 1 mg via INTRAVENOUS
  Filled 2017-03-25: qty 1

## 2017-03-25 MED ORDER — POTASSIUM CHLORIDE CRYS ER 10 MEQ PO TBCR
20.0000 meq | EXTENDED_RELEASE_TABLET | Freq: Every day | ORAL | Status: DC
  Administered 2017-03-26 – 2017-04-02 (×8): 20 meq via ORAL
  Filled 2017-03-25 (×8): qty 2

## 2017-03-25 MED ORDER — DIPHENHYDRAMINE HCL 25 MG PO CAPS: 25.0000 mg | ORAL_CAPSULE | Freq: Four times a day (QID) | ORAL | Status: DC | PRN

## 2017-03-25 MED ORDER — METRONIDAZOLE IN NACL 5-0.79 MG/ML-% IV SOLN
500.0000 mg | Freq: Three times a day (TID) | INTRAVENOUS | Status: DC
  Administered 2017-03-26 – 2017-04-01 (×18): 500 mg via INTRAVENOUS
  Filled 2017-03-25 (×20): qty 100

## 2017-03-25 MED ORDER — VITAMIN D-3 125 MCG (5000 UT) PO TABS: 5000.0000 [IU] | ORAL_TABLET | Freq: Every day | ORAL | Status: DC

## 2017-03-25 MED ORDER — SODIUM CHLORIDE 0.9 % IV SOLN
1250.0000 mg | INTRAVENOUS | Status: DC
  Administered 2017-03-26 – 2017-03-31 (×6): 1250 mg via INTRAVENOUS
  Filled 2017-03-25 (×6): qty 1250

## 2017-03-25 MED ORDER — VITAMIN B-12 1000 MCG PO TABS
1000.0000 ug | ORAL_TABLET | Freq: Every day | ORAL | Status: DC
  Administered 2017-03-26 – 2017-04-02 (×8): 1000 ug via ORAL
  Filled 2017-03-25 (×8): qty 1

## 2017-03-25 MED ORDER — CEFTRIAXONE SODIUM 2 G IJ SOLR
2.0000 g | INTRAMUSCULAR | Status: DC
  Administered 2017-03-26 – 2017-03-31 (×6): 2 g via INTRAVENOUS
  Filled 2017-03-25 (×7): qty 2

## 2017-03-25 MED ORDER — HYDROMORPHONE HCL 1 MG/ML IJ SOLN
1.0000 mg | INTRAMUSCULAR | Status: DC | PRN
  Administered 2017-03-25: 1 mg via INTRAVENOUS
  Filled 2017-03-25: qty 1

## 2017-03-25 MED ORDER — ZINC OXIDE 20 % EX OINT
1.0000 "application " | TOPICAL_OINTMENT | CUTANEOUS | Status: DC
  Administered 2017-03-26 – 2017-03-31 (×3): 1 via TOPICAL
  Filled 2017-03-25: qty 28.35

## 2017-03-25 MED ORDER — FUROSEMIDE 40 MG PO TABS
40.0000 mg | ORAL_TABLET | Freq: Two times a day (BID) | ORAL | Status: DC
  Administered 2017-03-26 – 2017-04-02 (×14): 40 mg via ORAL
  Filled 2017-03-25 (×14): qty 1

## 2017-03-25 MED ORDER — OXYCODONE-ACETAMINOPHEN 5-325 MG PO TABS
1.0000 | ORAL_TABLET | Freq: Three times a day (TID) | ORAL | Status: DC | PRN
Start: 2017-03-25 — End: 2017-03-26
  Administered 2017-03-25 – 2017-03-26 (×3): 1 via ORAL
  Filled 2017-03-25 (×3): qty 1

## 2017-03-25 MED ORDER — HYDROCORTISONE 1 % EX CREA: 1.0000 "application " | TOPICAL_CREAM | Freq: Two times a day (BID) | CUTANEOUS | Status: DC | PRN

## 2017-03-25 MED ORDER — BISACODYL 10 MG RE SUPP
10.0000 mg | Freq: Every day | RECTAL | Status: DC | PRN
  Filled 2017-03-25: qty 1

## 2017-03-25 MED ORDER — METOPROLOL TARTRATE 25 MG PO TABS
25.0000 mg | ORAL_TABLET | Freq: Every day | ORAL | Status: DC
  Administered 2017-03-26 – 2017-04-02 (×8): 25 mg via ORAL
  Filled 2017-03-25 (×8): qty 1

## 2017-03-25 MED ORDER — TRIAMCINOLONE ACETONIDE 0.1 % EX CREA: 1.0000 "application " | TOPICAL_CREAM | Freq: Two times a day (BID) | CUTANEOUS | Status: DC | PRN

## 2017-03-25 MED ORDER — PAROXETINE HCL 20 MG PO TABS
40.0000 mg | ORAL_TABLET | ORAL | Status: DC
  Administered 2017-03-26 – 2017-04-02 (×7): 40 mg via ORAL
  Filled 2017-03-25 (×7): qty 2

## 2017-03-25 MED ORDER — PANTOPRAZOLE SODIUM 40 MG PO TBEC
40.0000 mg | DELAYED_RELEASE_TABLET | Freq: Every day | ORAL | Status: DC
  Administered 2017-03-26 – 2017-04-02 (×8): 40 mg via ORAL
  Filled 2017-03-25 (×8): qty 1

## 2017-03-25 MED ORDER — BUPROPION HCL ER (XL) 150 MG PO TB24
450.0000 mg | ORAL_TABLET | Freq: Every day | ORAL | Status: DC
  Administered 2017-03-26 – 2017-04-02 (×7): 450 mg via ORAL
  Filled 2017-03-25 (×7): qty 3

## 2017-03-25 MED ORDER — METFORMIN HCL 850 MG PO TABS: 850.0000 mg | ORAL_TABLET | Freq: Two times a day (BID) | ORAL | Status: DC

## 2017-03-25 MED ORDER — MORPHINE SULFATE (PF) 4 MG/ML IV SOLN
2.0000 mg | INTRAVENOUS | Status: DC | PRN
  Administered 2017-03-25 – 2017-03-29 (×20): 4 mg via INTRAVENOUS
  Filled 2017-03-25 (×21): qty 1

## 2017-03-25 MED ORDER — TAMSULOSIN HCL 0.4 MG PO CAPS
0.4000 mg | ORAL_CAPSULE | Freq: Every day | ORAL | Status: DC
  Administered 2017-03-26 – 2017-04-02 (×8): 0.4 mg via ORAL
  Filled 2017-03-25 (×9): qty 1

## 2017-03-25 MED ORDER — OXYCODONE HCL ER 10 MG PO T12A
60.0000 mg | EXTENDED_RELEASE_TABLET | Freq: Two times a day (BID) | ORAL | Status: DC
  Administered 2017-03-25 – 2017-03-29 (×8): 60 mg via ORAL
  Filled 2017-03-25 (×8): qty 6

## 2017-03-25 MED ORDER — INSULIN ASPART 100 UNIT/ML ~~LOC~~ SOLN: 0.0000 [IU] | SUBCUTANEOUS | Status: DC

## 2017-03-25 MED ORDER — PYRITHIONE ZINC 1 % EX SHAM: 1.0000 "application " | MEDICATED_SHAMPOO | CUTANEOUS | Status: DC

## 2017-03-25 MED ORDER — HEPARIN SODIUM (PORCINE) 5000 UNIT/ML IJ SOLN
5000.0000 [IU] | Freq: Three times a day (TID) | INTRAMUSCULAR | Status: DC
  Administered 2017-03-25 – 2017-04-02 (×21): 5000 [IU] via SUBCUTANEOUS
  Filled 2017-03-25 (×21): qty 1

## 2017-03-25 MED ORDER — INSULIN ASPART 100 UNIT/ML ~~LOC~~ SOLN
0.0000 [IU] | Freq: Three times a day (TID) | SUBCUTANEOUS | Status: DC
  Administered 2017-03-26: 1 [IU] via SUBCUTANEOUS
  Administered 2017-03-26 (×2): 2 [IU] via SUBCUTANEOUS
  Administered 2017-03-27: 3 [IU] via SUBCUTANEOUS
  Administered 2017-03-27: 1 [IU] via SUBCUTANEOUS
  Administered 2017-03-27: 2 [IU] via SUBCUTANEOUS
  Administered 2017-03-28: 3 [IU] via SUBCUTANEOUS
  Administered 2017-03-28 – 2017-03-29 (×3): 2 [IU] via SUBCUTANEOUS
  Administered 2017-03-30 (×2): 1 [IU] via SUBCUTANEOUS
  Administered 2017-03-30: 2 [IU] via SUBCUTANEOUS
  Administered 2017-03-31: 1 [IU] via SUBCUTANEOUS
  Administered 2017-03-31: 2 [IU] via SUBCUTANEOUS
  Administered 2017-03-31 – 2017-04-01 (×3): 1 [IU] via SUBCUTANEOUS
  Administered 2017-04-01: 2 [IU] via SUBCUTANEOUS
  Administered 2017-04-02: 3 [IU] via SUBCUTANEOUS
  Administered 2017-04-02: 1 [IU] via SUBCUTANEOUS

## 2017-03-25 MED ORDER — FLUTICASONE PROPIONATE 50 MCG/ACT NA SUSP
1.0000 | Freq: Two times a day (BID) | NASAL | Status: DC
  Administered 2017-03-25 – 2017-04-02 (×13): 1 via NASAL
  Filled 2017-03-25: qty 16

## 2017-03-25 MED ORDER — DOCUSATE SODIUM 100 MG PO CAPS
100.0000 mg | ORAL_CAPSULE | Freq: Two times a day (BID) | ORAL | Status: DC
  Administered 2017-03-25 – 2017-03-28 (×7): 100 mg via ORAL
  Filled 2017-03-25 (×8): qty 1

## 2017-03-25 MED ORDER — CALCIUM CARBONATE ANTACID 500 MG PO CHEW
2.0000 | CHEWABLE_TABLET | Freq: Three times a day (TID) | ORAL | Status: DC | PRN
  Administered 2017-03-29 – 2017-04-01 (×2): 400 mg via ORAL
  Filled 2017-03-25 (×3): qty 2

## 2017-03-25 MED ORDER — INSULIN ASPART 100 UNIT/ML ~~LOC~~ SOLN
0.0000 [IU] | Freq: Every day | SUBCUTANEOUS | Status: DC
  Administered 2017-03-26: 3 [IU] via SUBCUTANEOUS

## 2017-03-25 NOTE — ED Notes (Signed)
Patient transported to X-ray 

## 2017-03-25 NOTE — Progress Notes (Signed)
PHARMACIST - PHYSICIAN COMMUNICATION DR: Eliseo Squires CONCERNING:  METFORMIN SAFE ADMINISTRATION POLICY  RECOMMENDATION: Metformin has been placed on DISCONTINUE (rejected order) STATUS and should be reordered only after any of the conditions below are ruled out.  Current Safety recommendations include avoiding metformin for a minimum of 48 hours after the patient's exposure to intravenous contrast media for the following conditions:  . eGFR < 60 ml/min  . Liver disease, alcoholism, heart failure, intra-arterial administration of contrast  DESCRIPTION:  The Pharmacy Committee has adopted a policy that restricts the use of metformin in hospitalized patients until all the contraindications to administration have been ruled out. Specific contraindications are: [x]  Serum creatinine ? 1.5 for males []  Serum creatinine ? 1.4 for females []  Shock, acute MI, sepsis, hypoxemia, dehydration []  Planned administration of intravenous iodinated contrast media when eGFR < 74m/min, Liver Disease, alcoholism, heart failure or intra-arterial administration of contrast []  Heart Failure patients with low EF []  Acute or chronic metabolic acidosis (including DKA)   WGeorgina Peer [facility]10/01/2017 8:15 PM

## 2017-03-25 NOTE — ED Notes (Signed)
Pt using phone to contact daughter at this time.

## 2017-03-25 NOTE — Consult Note (Signed)
Reason for Consult:  Right foot pain Referring Physician: Dr. Herby Abraham is an 56 y.o. male.  HPI:  56 y/o male with complex PMH including diabetes and osteomyelitis of the spine complicated by abscess.  He has been generally unwell for more than a year with many admissions.  He has had multiple spine surgeries for osteo and subsequent fusion.  After a prolonged recovery period, he and his daughter noted that he had developed weakness in both LEs worse on the right than the left.  He has not been able to bear weight for a year or more.  He can transfer on the L.  He denies any recent f/c/n/v/wt loss.  Diabetes has been in good control.  I saw him back in the spring for evaluation of chronic right calcaneus osteomyelitis.  He has been on multiple abx for as far back as he can remember.  He is on augment at the SNF currently.  He c/o pain in the R heel and foot that is severe and has been getting worse over the last couple of weeks.  Daughter is at bedside.  Past Medical History:  Diagnosis Date  . Acute osteomyelitis of calcaneum, right ([place]) 10/02/2016  . Acute respiratory failure ([place])   . Anemia   . Basal cell carcinoma, eyelid   . Cancer ([place])   . Chronic back pain   . COPD (chronic obstructive pulmonary disease) ([place])   . DDD (degenerative disc disease), lumbar   . Diabetes mellitus without complication ([place])   . Diabetic ulcer of toe of left foot ([place]) 10/02/2016  . 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 Foot drop, bilateral   . Foot drop, right 12/27/2015  . Hyperlipidemia   . Hypertension   . MRSA bacteremia   . Osteomyelitis ([place])   . Osteomyelitis ([place])   . Urinary retention   . Urinary retention     Past Surgical History:  Procedure Laterality Date  . ANTERIOR CERVICAL CORPECTOMY N/A 11/25/2015   Procedure: ANTERIOR CERVICAL FIVE CORPECTOMY Cervical four - six fusion;  Surgeon: Consuella Lose, MD;  Location: [facility];  Service: Neurosurgery;  Laterality: N/A;  ANTERIOR  CERVICAL FIVE CORPECTOMY Cervical four - six fusion  . POSTERIOR CERVICAL FUSION/FORAMINOTOMY N/A 11/29/2015   Procedure: Cervical Three-Cervical Seven Posterior Cervical Laminectomy with Fusion;  Surgeon: Consuella Lose, MD;  Location: [facility];  Service: Neurosurgery;  Laterality: N/A;  Cervical Three-Cervical Seven Posterior Cervical Laminectomy with Fusion    Family History  Problem Relation Age of Onset  . Cancer Other   . Diabetes Other     Social History:  reports that he has quit smoking. His smoking use included Cigarettes. He smoked 3.00 packs per day. He has never used smokeless tobacco. He reports that he does not drink alcohol or use drugs.  Allergies: No Known Allergies  Medications: I have reviewed the patient's current medications.   Results for orders placed or performed during the hospital encounter of 03/25/17 (from the past 48 hour(s))  CBC with Differential/Platelet     Status: Abnormal   Collection Time: 03/25/17  3:33 PM  Result Value Ref Range   WBC 7.8 4.0 - 10.5 K/uL   RBC 3.82 (L) 4.22 - 5.81 MIL/uL   Hemoglobin 10.2 (L) 13.0 - 17.0 g/dL   HCT 33.1 (L) 39.0 - 52.0 %   MCV 86.6 78.0 - 100.0 fL   MCH 26.7 26.0 - 34.0 pg   MCHC 30.8 30.0 - 36.0 g/dL  RDW 16.2 (H) 11.5 - 15.5 %   Platelets 326 150 - 400 K/uL   Neutrophils Relative % 63 %   Neutro Abs 4.9 1.7 - 7.7 K/uL   Lymphocytes Relative 26 %   Lymphs Abs 2.1 0.7 - 4.0 K/uL   Monocytes Relative 8 %   Monocytes Absolute 0.6 0.1 - 1.0 K/uL   Eosinophils Relative 2 %   Eosinophils Absolute 0.2 0.0 - 0.7 K/uL   Basophils Relative 1 %   Basophils Absolute 0.0 0.0 - 0.1 K/uL  Comprehensive metabolic panel     Status: Abnormal   Collection Time: 03/25/17  3:33 PM  Result Value Ref Range   Sodium 135 135 - 145 mmol/L   Potassium 4.4 3.5 - 5.1 mmol/L    Comment: SLIGHT HEMOLYSIS   Chloride 94 (L) 101 - 111 mmol/L   CO2 29 22 - 32 mmol/L   Glucose, Bld 146 (H) 65 - 99 mg/dL   BUN 20 6 - 20  mg/dL   Creatinine, Ser 1.93 (H) 0.61 - 1.24 mg/dL   Calcium 9.2 8.9 - 10.3 mg/dL   Total Protein 7.7 6.5 - 8.1 g/dL   Albumin 3.2 (L) 3.5 - 5.0 g/dL   AST 30 15 - 41 U/L   ALT 19 17 - 63 U/L   Alkaline Phosphatase 90 38 - 126 U/L   Total Bilirubin 0.7 0.3 - 1.2 mg/dL   GFR calc non Af Amer 37 (L) >60 mL/min   GFR calc Af Amer 43 (L) >60 mL/min    Comment: (NOTE) The eGFR has been calculated using the CKD EPI equation. This calculation has not been validated in all clinical situations. eGFR's persistently <60 mL/min signify possible Chronic Kidney Disease.    Anion gap 12 5 - 15  Hemoglobin A1c     Status: Abnormal   Collection Time: 03/25/17  5:25 PM  Result Value Ref Range   Hgb A1c MFr Bld 7.2 (H) 4.8 - 5.6 %    Comment: (NOTE) Pre diabetes:          5.7%-6.4% Diabetes:              >6.4% Glycemic control for   <7.0% adults with diabetes    Mean Plasma Glucose 159.94 mg/dL  Sedimentation rate     Status: Abnormal   Collection Time: 03/25/17  5:45 PM  Result Value Ref Range   Sed Rate 120 (H) 0 - 16 mm/hr  C-reactive protein     Status: Abnormal   Collection Time: 03/25/17  5:45 PM  Result Value Ref Range   CRP 4.9 (H) <1.0 mg/dL  Prealbumin     Status: None   Collection Time: 03/25/17  5:45 PM  Result Value Ref Range   Prealbumin 23.3 18 - 38 mg/dL    Dg Foot Complete Right  Result Date: 03/25/2017 CLINICAL DATA:  56 year old male with lower extremity wounds and pain. EXAM: RIGHT FOOT COMPLETE - 3+ VIEW COMPARISON:  Foot radiographs 10/02/2016 and earlier. FINDINGS: Interval improved and largely normalized bone mineralization at the posterior calcaneus where osteolysis was noted in April. However, there is new cortical osteolysis and destruction of the head of the right second proximal phalanx (arrows). There is involvement of the right PIP and likely also the base of the second middle phalanx. No other cortical osteolysis in the right foot. There is increased distal  foot soft tissue swelling. No subcutaneous gas. IMPRESSION: 1. Positive for Osteomyelitis and septic joint of the right second  PIP. Destruction of the head of the right second proximal phalanx since April. 2. Associated increased distal foot soft tissue swelling. No subcutaneous gas. 3. Improved appearance of the dorsal calcaneus since April. Electronically Signed   By: Genevie Ann M.D.   On: 03/25/2017 15:29    ROS:  As above. PE:  Blood pressure 125/77, pulse 76, temperature 98.2 F (36.8 C), temperature source Oral, resp. rate 18, height 6' (1.829 m), weight 99.9 kg (220 lb 3.8 oz), SpO2 97 %. wn wd male in nad.  A and ox 4.  Mood and affect normal.  EOMI.  resp unlabored.  R foot in fixed equinovarus position.  Ulcers at lateral heel and lateral forefoot.  2nd toe swollen and erythematous with dorsal ulcer.  TTP diffusely about the heel and forefoot.  No active DF of the ankle or toes.  1/5 strength in PF of the ankle.  Brisk cap refill at the toes.  L hallux with gross valgus deformity att he IP joint.  Erythema and swelling with dorsal ulcer.  I can't passively correct the toe.  TTP at the forefoot.  Brisk cap refill at the toes.  2/5 strength in PF, 1/5 in DF.  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  Plain films show osteo of the 2nd toe.  I'm suspicious that there is osteo of the lateral forefoot and calcaneus.  I'll order MRI of the foot to assess.  He'll likely need amputation, but the level is yet to be determined.  L hallux ulcer and gross deformity - based on his exam, I believe he may have osteo and possibly an open IP joint.  MRI of the left forefoot to assess.  He and his daughter understand the plan and agree.  Wylene Simmer 03/25/2017, 7:23 PM

## 2017-03-25 NOTE — ED Provider Notes (Signed)
[facility] DEPT Provider Note   CSN: [id number] Arrival date & time: 03/25/17  1403     History   Chief Complaint Chief Complaint  Patient presents with  . Wound Infection    HPI Gregory [name] is a 56 y.o. male.  HPI 56 year old male with a history of hypertension, hyperlipidemia, diabetes, peripheral vascular disease, prior osteomyelitis of presents to the emergency department for worsening right foot pain for the past 2 weeks. Patient lives in a rehabilitation facility with the facility physician is managing his wounds. Patient is currently taking Augmentin for a right foot diabetic foot ulcer with surrounding cellulitis. He's been on this for approximately one week. Reports that the ulcer on the lateral aspect of the right fifth MTP looks the same as it did one week ago. Patient also endorsing calcaneal pain on the right foot. This is where he had osteomyelitis back in July that was treated with vancomycin. He denies recent fevers, chills, trauma. Reports that his pain isn't controlled with his home high dose narcotic medication. Pain is intermittent in nature, feels like a burning or shooting sensation. No real alleviating or aggravating factors. He denies any other physical complaints.  Past Medical History:  Diagnosis Date  . Acute osteomyelitis of calcaneum, right ([place]) 10/02/2016  . Acute respiratory failure ([place])   . Anemia   . Basal cell carcinoma, eyelid   . Cancer ([place])   . Chronic back pain   . COPD (chronic obstructive pulmonary disease) ([place])   . DDD (degenerative disc disease), lumbar   . Diabetes mellitus without complication ([place])   . Diabetic ulcer of toe of left foot ([place]) 10/02/2016  . 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 Foot drop, bilateral   . Foot drop, right 12/27/2015  . Hyperlipidemia   . Hypertension   . MRSA bacteremia   . Osteomyelitis ([place])   . Osteomyelitis ([place])   . Urinary retention   . Urinary retention     Patient Active Problem List   Diagnosis Date Noted  . Depression, recurrent ([place]) 11/05/2016  . Acute osteomyelitis of calcaneum, right ([place]) 10/02/2016  . Diabetic ulcer of toe of left foot ([place]) 10/02/2016  . 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 Chronic pain 09/27/2016  . Dyslipidemia associated with type 2 diabetes mellitus ([place]) 06/06/2016  . GERD without esophagitis 05/11/2016  . Hypertensive heart disease with congestive heart failure ([place]) 12/15/2015  . Spinal stenosis in cervical region 12/15/2015  . Type II diabetes mellitus with neurological manifestations ([place]) 12/15/2015  . Chronic diastolic CHF (congestive heart failure) ([place])   . Pulmonary hypertension ([place])   . Urinary retention   . Foot drop, bilateral   . Pressure ulcer 11/10/2015  . MRSA bacteremia 10/03/2015  . COPD (chronic obstructive pulmonary disease) ([place]) 10/03/2015    Past Surgical History:  Procedure Laterality Date  . ANTERIOR CERVICAL CORPECTOMY N/A 11/25/2015   Procedure: ANTERIOR CERVICAL FIVE CORPECTOMY Cervical four - six fusion;  Surgeon: Consuella Lose, MD;  Location: [facility];  Service: Neurosurgery;  Laterality: N/A;  ANTERIOR CERVICAL FIVE CORPECTOMY Cervical four - six fusion  . POSTERIOR CERVICAL FUSION/FORAMINOTOMY N/A 11/29/2015   Procedure: Cervical Three-Cervical Seven Posterior Cervical Laminectomy with Fusion;  Surgeon: Consuella Lose, MD;  Location: [facility];  Service: Neurosurgery;  Laterality: N/A;  Cervical Three-Cervical Seven Posterior Cervical Laminectomy with Fusion       Home Medications    Prior to Admission medications   Medication Sig Start Date End Date Taking? Authorizing Provider  albuterol (  PROVENTIL HFA;VENTOLIN HFA) 108 (90 Base) MCG/ACT inhaler Inhale 2 puffs into the lungs every 6 (six) hours as needed for shortness of breath.    Yes [provider]  bisacodyl (DULCOLAX) 10 MG suppository Place 1 suppository (10 mg total) rectally daily as needed for moderate constipation. Patient  taking differently: Place 10 mg rectally daily as needed (for constipation).  12/02/15  Yes [name], MD  BuPROPion HCl ER, XL, 450 MG TB24 Take 450 mg by mouth daily.    Yes [provider]  calcium carbonate (TUMS - DOSED IN MG ELEMENTAL CALCIUM) 500 MG chewable tablet Chew 2 tablets by mouth every 8 (eight) hours as needed for indigestion or heartburn.   Yes [provider]  Cholecalciferol (VITAMIN D-3) 5000 units TABS Take 5,000 Units by mouth daily.   Yes [provider]  cyclobenzaprine (FLEXERIL) 10 MG tablet Take 10 mg by mouth every 8 (eight) hours as needed for muscle spasms.   Yes [provider]  diphenhydrAMINE (BENADRYL) 25 MG tablet Take 1 tablet (25 mg total) by mouth every 6 (six) hours. Patient taking differently: Take 25 mg by mouth every 6 (six) hours as needed (for Pruritus).  02/23/17  Yes Jola Schmidt, MD  docusate sodium (COLACE) 100 MG capsule Take 100 mg by mouth 2 (two) times daily.   Yes [provider]  fluticasone (FLONASE) 50 MCG/ACT nasal spray Place 1 spray into both nostrils 2 (two) times daily.   Yes [provider]  furosemide (LASIX) 40 MG tablet Take 40 mg by mouth 2 (two) times daily.   Yes [provider]  gabapentin (NEURONTIN) 400 MG capsule Take 400 mg by mouth 3 (three) times daily.   Yes [provider]  glucagon (GLUCAGON EMERGENCY) 1 MG injection Inject 1 mg into the muscle See admin instructions. FOR BGL LESS THAN 70   Yes [provider]  hydrocortisone cream 1 % Apply 1 application topically every 12 (twelve) hours as needed for itching. APPLY TO HANDS AND GROIN   Yes [provider]  hydrOXYzine (ATARAX/VISTARIL) 25 MG tablet Take 25 mg by mouth every 6 (six) hours as needed for itching.   Yes [provider]  insulin lispro (HUMALOG) 100 UNIT/ML injection Inject 0-10 Units into the skin See admin instructions. THREE TIMES A DAY BEFORE MEALS PER SLIDING  SCALE: BGL 0-150 = 0 units; 151-200 = 2 units; 201-250 = 4 units; 251-300 = 6 units; 301-350 = 8 units; 351-400 = 10 units; 400 or greater = CALL MD/NP/PA   Yes [provider]  linezolid (ZYVOX) 600 MG tablet Take 600 mg by mouth every 12 (twelve) hours. FOR 10 DAYS 03/22/17 03/31/17 Yes [provider]  magnesium oxide (MAG-OX) 400 MG tablet Take 400 mg by mouth 2 (two) times daily.   Yes [provider]  metFORMIN (GLUCOPHAGE) 850 MG tablet Take 850 mg by mouth 2 (two) times daily.   Yes [provider]  metoprolol tartrate (LOPRESSOR) 25 MG tablet Take 25 mg by mouth daily.   Yes [provider]  Multiple Vitamins-Minerals (DECUBI-VITE PO) Take 1 tablet by mouth daily.   Yes [provider]  nystatin (MYCOSTATIN/NYSTOP) powder Apply 1 g topically 3 (three) times daily. APPLY TO GROIN AREA FOR YEAST   Yes [provider]  omeprazole (PRILOSEC) 20 MG capsule Take 20 mg by mouth daily.   Yes [provider]  oxyCODONE (OXYCONTIN) 60 MG 12 hr tablet Take one tablet by mouth  every 12 hours for pain Patient taking differently: Take 60 mg by mouth every 12 (twelve) hours.  10/19/16  Yes Lauree Chandler, NP  oxyCODONE-acetaminophen (ROXICET) 5-325 MG tablet Take one tablet by mouth every 8 hours as needed for pain. DO NOT EXCEED 3GM OF APAP IN 24 HOURS FROM ALL SOURCES Patient taking differently: Take 1 tablet by mouth every 8 (eight) hours as needed for moderate pain or severe pain.  11/26/16  Yes Reed, Tiffany L, DO  PARoxetine (PAXIL) 40 MG tablet Take 40 mg by mouth every morning.   Yes [provider]  potassium chloride (K-DUR) 10 MEQ tablet Take 2 tablets (20 mEq total) by mouth daily. 12/02/15  Yes [name], MD  pyrithione zinc (SELSUN BLUE DRY SCALP) 1 % shampoo Apply 1 application topically 2 (two) times a week. APPLY TO SCALP AND FACE ON TUES & FRI   Yes [provider]  senna-docusate (SENOKOT-S) 8.6-50 MG  tablet Take 2 tablets by mouth at bedtime. 12/02/15  Yes [name], MD  Skin Protectants, Misc. (CALAZIME SKIN PROTECTANT) PSTE Apply 1 application topically See admin instructions. APPLY TO SACRAL AREA TWO TIMES A DAY AND AS NEEDED AFTER CLEANSING FROM SOILING/WETNESS TO PREVENT BREAKDOWN   Yes [provider]  sodium chloride (OCEAN) 0.65 % SOLN nasal spray Place 1 spray into both nostrils as needed for congestion. 12/02/15  Yes [name], MD  tamsulosin (FLOMAX) 0.4 MG CAPS capsule Take 0.4 mg by mouth daily.  11/26/13  Yes [provider]  traZODone (DESYREL) 50 MG tablet Take 50 mg by mouth at bedtime.   Yes [provider]  triamcinolone cream (KENALOG) 0.1 % Apply 1 application topically every 12 (twelve) hours as needed (for rash).   Yes [provider]  vitamin B-12 (CYANOCOBALAMIN) 1000 MCG tablet Take 1,000 mcg by mouth daily.   Yes [provider]  Vitamins A & D (VITAMIN A & D) ointment Apply 1 application topically 3 (three) times daily. APPLY TO LEFT HEEL   Yes [provider]  clindamycin (CLEOCIN) 150 MG capsule Take 3 capsules (450 mg total) by mouth 3 (three) times daily. Patient not taking: Reported on 03/25/2017 02/22/17   Quintella Reichert, MD  diazepam (VALIUM) 5 MG tablet Take 1 tablet (5 mg total) by mouth every 8 (eight) hours as needed for anxiety or muscle spasms. Patient not taking: Reported on 03/25/2017 12/02/15   [name], MD  famotidine (PEPCID) 20 MG tablet Take 1 tablet (20 mg total) by mouth 2 (two) times daily. Patient not taking: Reported on 03/25/2017 02/23/17   Jola Schmidt, MD  metFORMIN (GLUCOPHAGE) 500 MG tablet Take 1 tablet (500 mg total) by mouth 2 (two) times daily with a meal. Patient not taking: Reported on 03/25/2017 12/02/15   [name], MD  polyethylene glycol (MIRALAX / Floria Raveling) packet Take 17 g by mouth daily. Hold if diarrhea Patient not taking: Reported on 03/25/2017 12/02/15   [name], MD  pregabalin  (LYRICA) 75 MG capsule Take 1 capsule (75 mg total) by mouth every 8 (eight) hours. Patient not taking: Reported on 03/25/2017 12/02/15   [name], MD  tizanidine (ZANAFLEX) 2 MG capsule Take 1 capsule (2 mg total) by mouth 3 (three) times daily as needed for muscle spasms. Patient not taking: Reported on 03/25/2017 12/02/15   [name], MD    Family History Family History  Problem Relation Age of Onset  . Cancer Other   . Diabetes Other  Social History Social History  Substance Use Topics  . Smoking status: Former Smoker    Packs/day: 3.00    Types: Cigarettes  . Smokeless tobacco: Never Used  . Alcohol use No     Allergies   Patient has no known allergies.   Review of Systems Review of Systems All other systems are reviewed and are negative for acute change except as noted in the HPI   Physical Exam Updated Vital Signs BP (!) 142/87 (BP Location: Left Arm)   Pulse 85   Temp 99.3 F (37.4 C) (Oral)   Resp 16   Wt 99.8 kg (220 lb)   SpO2 97%   BMI 29.84 kg/m   Physical Exam  Constitutional: He is oriented to person, place, and time. He appears well-developed and well-nourished. No distress.  HENT:  Head: Normocephalic and atraumatic.  Right Ear: External ear normal.  Left Ear: External ear normal.  Nose: Nose normal.  Mouth/Throat: Mucous membranes are normal. No trismus in the jaw.  Eyes: Conjunctivae and EOM are normal. No scleral icterus.  Neck: Normal range of motion and phonation normal.  Cardiovascular: Normal rate and regular rhythm.   Pulmonary/Chest: Effort normal. No stridor. No respiratory distress.  Abdominal: He exhibits no distension.  Musculoskeletal: Normal range of motion. He exhibits no edema.       Feet:  Neurological: He is alert and oriented to person, place, and time.  Skin: He is not diaphoretic.  Psychiatric: He has a normal mood and affect. His behavior is normal.  Vitals reviewed.    ED Treatments / Results  Labs (all labs  ordered are listed, but only abnormal results are displayed) Labs Reviewed  CBC WITH DIFFERENTIAL/PLATELET - Abnormal; Notable for the following:       Result Value   RBC 3.82 (*)    Hemoglobin 10.2 (*)    HCT 33.1 (*)    RDW 16.2 (*)    All other components within normal limits  COMPREHENSIVE METABOLIC PANEL - Abnormal; Notable for the following:    Chloride 94 (*)    Glucose, Bld 146 (*)    Creatinine, Ser 1.93 (*)    Albumin 3.2 (*)    GFR calc non Af Amer 37 (*)    GFR calc Af Amer 43 (*)    All other components within normal limits  CULTURE, BLOOD (ROUTINE X 2)  CULTURE, BLOOD (ROUTINE X 2)    EKG  EKG Interpretation None       Radiology Dg Foot Complete Right  Result Date: 03/25/2017 CLINICAL DATA:  56 year old male with lower extremity wounds and pain. EXAM: RIGHT FOOT COMPLETE - 3+ VIEW COMPARISON:  Foot radiographs 10/02/2016 and earlier. FINDINGS: Interval improved and largely normalized bone mineralization at the posterior calcaneus where osteolysis was noted in April. However, there is new cortical osteolysis and destruction of the head of the right second proximal phalanx (arrows). There is involvement of the right PIP and likely also the base of the second middle phalanx. No other cortical osteolysis in the right foot. There is increased distal foot soft tissue swelling. No subcutaneous gas. IMPRESSION: 1. Positive for Osteomyelitis and septic joint of the right second PIP. Destruction of the head of the right second proximal phalanx since April. 2. Associated increased distal foot soft tissue swelling. No subcutaneous gas. 3. Improved appearance of the dorsal calcaneus since April. Electronically Signed   By: Genevie Ann M.D.   On: 03/25/2017 15:29    Procedures Procedures (including  critical care time)  Medications Ordered in ED Medications  HYDROmorphone (DILAUDID) injection 1 mg (1 mg Intravenous Given 03/25/17 1538)  oxyCODONE (Oxy IR/ROXICODONE) immediate  release tablet 5 mg (5 mg Oral Given 03/25/17 1646)     Initial Impression / Assessment and Plan / ED Course  I have reviewed the triage vital signs and the nursing notes.  Pertinent labs & imaging results that were available during my care of the patient were reviewed by me and considered in my medical decision making (see chart for details).     Workup consistent with osteomyelitis of the second PIP. Patient is not septic at this time. We'll hold off on antibiotics until surgery can obtain a biopsy. Case discussed with medicine for admission, further workup and management.  Final Clinical Impressions(s) / ED Diagnoses   Final diagnoses:  Other acute osteomyelitis of right foot ([place])      Bessie Livingood, Grayce Sessions, MD 03/25/17 1705

## 2017-03-25 NOTE — Progress Notes (Signed)
Pharmacy Antibiotic Note  Gregory [name] is a 56 y.o. male admitted on 03/25/2017 with DFI.  Pharmacy has been consulted for vancomycin dosing.  SCr elevated at 1.93 but seems to be around baseline from earlier this year. Normalized CrCl ~40ml/min.  Plan: Give vancomycin 2g IV x 1, then start vancomycin 1,250mg  IV Q24h Monitor clinical picture, renal function, VT at Css F/U C&S, abx deescalation / LOT   Weight: 220 lb (99.8 kg)  Temp (24hrs), Avg:99.3 F (37.4 C), Min:99.3 F (37.4 C), Max:99.3 F (37.4 C)   Recent Labs Lab 03/25/17 1533  WBC 7.8  CREATININE 1.93*    Estimated Creatinine Clearance: 51.7 mL/min (A) (by C-G formula based on SCr of 1.93 mg/dL (H)).    No Known Allergies   Thank you for allowing pharmacy to be a part of this patient's care.  Reginia Naas 03/25/2017 5:25 PM

## 2017-03-25 NOTE — H&P (Signed)
History and Physical    Foye Damron DUK:[id number] DOB: 03-Sep-1960 DOA: 03/25/2017  Referring MD/NP/PA: Joni Fears PCP: Patient, No Pcp Per Outpatient Specialists: Doran Durand (ortho) Patient coming from: [place])  Chief Complaint: foot pain  HPI: Mylik Pro is a 56 y.o. 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  He was being followed by Tommy Medal and Dr. Doran Durand.  Last note from Dr. Tommy Medal said 7/18: THERE IS ABSOLUTELY NO POINT IN TRYING TO SALVAGE THIS MAN'S INFECTED LIMB #1 HE CANNOT WALK #2 THIS INFECTION COULD BE A SOURCE OF HIS RECENT BACTEREMIA AND EPIDURAL ABSCESS THAT CAUSED HIM TO NEARLY BE PARALYZED. CLEAR PT NEEDS CURATIVE BKA AND I WOULD LIKE THE PATIENT SEEN BY DR. Doran Durand ASAP  Patient has been on multiple abx in his SNF, most recently augmentin.  Unfortunately his pain has worsened and wounds have spread. Pain ranges from burning to shooting.  No fever, no chills.  Patient states he has had a melanoma removed from his right eyebrow and is scheduled to undergo lymph node biopsy (no path in chart-- ? Basal cell)  Per his daughter who is a Marine scientist at [employer], he has been non-ambulatory due to foot drop since 2017 when he had back surgery.    Review of Systems: all systems reviewed, negative unless stated above in HPI   Past Medical History:  Diagnosis Date  . Acute osteomyelitis of calcaneum, right ([place]) 10/02/2016  . Acute respiratory failure ([place])   . Anemia   . Basal cell carcinoma, eyelid   . Cancer ([place])   . Chronic back pain   . COPD (chronic obstructive pulmonary disease) ([place])   . DDD (degenerative disc disease), lumbar   . Diabetes mellitus without complication ([place])   . Diabetic ulcer of toe of left foot ([place]) 10/02/2016  . 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 Foot drop, bilateral   . Foot drop, right 12/27/2015  . Hyperlipidemia   . Hypertension   . MRSA bacteremia   . Osteomyelitis ([place])   .  Osteomyelitis ([place])   . Urinary retention   . Urinary retention     Past Surgical History:  Procedure Laterality Date  . ANTERIOR CERVICAL CORPECTOMY N/A 11/25/2015   Procedure: ANTERIOR CERVICAL FIVE CORPECTOMY Cervical four - six fusion;  Surgeon: Consuella Lose, MD;  Location: [place] [facility];  Service: Neurosurgery;  Laterality: N/A;  ANTERIOR CERVICAL FIVE CORPECTOMY Cervical four - six fusion  . POSTERIOR CERVICAL FUSION/FORAMINOTOMY N/A 11/29/2015   Procedure: Cervical Three-Cervical Seven Posterior Cervical Laminectomy with Fusion;  Surgeon: Consuella Lose, MD;  Location: [facility];  Service: Neurosurgery;  Laterality: N/A;  Cervical Three-Cervical Seven Posterior Cervical Laminectomy with Fusion     reports that he has quit smoking. His smoking use included Cigarettes. He smoked 3.00 packs per day. He has never used smokeless tobacco. He reports that he does not drink alcohol or use drugs.  No Known Allergies  Family History  Problem Relation Age of Onset  . Cancer Other   . Diabetes Other      Prior to Admission medications   Medication Sig Start Date End Date Taking? Authorizing Provider  albuterol (PROVENTIL HFA;VENTOLIN HFA) 108 (90 Base) MCG/ACT inhaler Inhale 2 puffs into the lungs every 6 (six) hours as needed for shortness of breath.    Yes [provider]  bisacodyl (DULCOLAX) 10 MG suppository Place 1 suppository (10 mg total) rectally daily as needed for moderate  constipation. Patient taking differently: Place 10 mg rectally daily as needed (for constipation).  12/02/15  Yes [name], MD  BuPROPion HCl ER, XL, 450 MG TB24 Take 450 mg by mouth daily.    Yes [provider]  calcium carbonate (TUMS - DOSED IN MG ELEMENTAL CALCIUM) 500 MG chewable tablet Chew 2 tablets by mouth every 8 (eight) hours as needed for indigestion or heartburn.   Yes [provider]  Cholecalciferol (VITAMIN D-3) 5000 units TABS Take 5,000 Units by mouth daily.    Yes [provider]  cyclobenzaprine (FLEXERIL) 10 MG tablet Take 10 mg by mouth every 8 (eight) hours as needed for muscle spasms.   Yes [provider]  diphenhydrAMINE (BENADRYL) 25 MG tablet Take 1 tablet (25 mg total) by mouth every 6 (six) hours. Patient taking differently: Take 25 mg by mouth every 6 (six) hours as needed (for Pruritus).  02/23/17  Yes Jola Schmidt, MD  docusate sodium (COLACE) 100 MG capsule Take 100 mg by mouth 2 (two) times daily.   Yes [provider]  fluticasone (FLONASE) 50 MCG/ACT nasal spray Place 1 spray into both nostrils 2 (two) times daily.   Yes [provider]  furosemide (LASIX) 40 MG tablet Take 40 mg by mouth 2 (two) times daily.   Yes [provider]  gabapentin (NEURONTIN) 400 MG capsule Take 400 mg by mouth 3 (three) times daily.   Yes [provider]  glucagon (GLUCAGON EMERGENCY) 1 MG injection Inject 1 mg into the muscle See admin instructions. FOR BGL LESS THAN 70   Yes [provider]  hydrocortisone cream 1 % Apply 1 application topically every 12 (twelve) hours as needed for itching. APPLY TO HANDS AND GROIN   Yes [provider]  hydrOXYzine (ATARAX/VISTARIL) 25 MG tablet Take 25 mg by mouth every 6 (six) hours as needed for itching.   Yes [provider]  insulin lispro (HUMALOG) 100 UNIT/ML injection Inject 0-10 Units into the skin See admin instructions. THREE TIMES A DAY BEFORE MEALS PER SLIDING SCALE: BGL 0-150 = 0 units; 151-200 = 2 units; 201-250 = 4 units; 251-300 = 6 units; 301-350 = 8 units; 351-400 = 10 units; 400 or greater = CALL MD/NP/PA   Yes [provider]  linezolid (ZYVOX) 600 MG tablet Take 600 mg by mouth every 12 (twelve) hours. FOR 10 DAYS 03/22/17 03/31/17 Yes [provider]  magnesium oxide (MAG-OX) 400 MG tablet Take 400 mg by mouth 2 (two) times daily.   Yes [provider]  metFORMIN (GLUCOPHAGE) 850 MG tablet Take  850 mg by mouth 2 (two) times daily.   Yes [provider]  metoprolol tartrate (LOPRESSOR) 25 MG tablet Take 25 mg by mouth daily.   Yes [provider]  Multiple Vitamins-Minerals (DECUBI-VITE PO) Take 1 tablet by mouth daily.   Yes [provider]  nystatin (MYCOSTATIN/NYSTOP) powder Apply 1 g topically 3 (three) times daily. APPLY TO GROIN AREA FOR YEAST   Yes [provider]  omeprazole (PRILOSEC) 20 MG capsule Take 20 mg by mouth daily.   Yes [provider]  oxyCODONE (OXYCONTIN) 60 MG 12 hr tablet Take one tablet by mouth every 12 hours for pain Patient taking differently: Take 60 mg by mouth every 12 (twelve) hours.  10/19/16  Yes Lauree Chandler, NP  oxyCODONE-acetaminophen (ROXICET) 5-325 MG tablet Take one tablet by mouth every 8 hours as needed for pain. DO NOT EXCEED 3GM OF APAP  IN 24 HOURS FROM ALL SOURCES Patient taking differently: Take 1 tablet by mouth every 8 (eight) hours as needed for moderate pain or severe pain.  11/26/16  Yes Reed, Tiffany L, DO  PARoxetine (PAXIL) 40 MG tablet Take 40 mg by mouth every morning.   Yes [provider]  potassium chloride (K-DUR) 10 MEQ tablet Take 2 tablets (20 mEq total) by mouth daily. 12/02/15  Yes [name], MD  pyrithione zinc (SELSUN BLUE DRY SCALP) 1 % shampoo Apply 1 application topically 2 (two) times a week. APPLY TO SCALP AND FACE ON TUES & FRI   Yes [provider]  senna-docusate (SENOKOT-S) 8.6-50 MG tablet Take 2 tablets by mouth at bedtime. 12/02/15  Yes [name], MD  Skin Protectants, Misc. (CALAZIME SKIN PROTECTANT) PSTE Apply 1 application topically See admin instructions. APPLY TO SACRAL AREA TWO TIMES A DAY AND AS NEEDED AFTER CLEANSING FROM SOILING/WETNESS TO PREVENT BREAKDOWN   Yes [provider]  sodium chloride (OCEAN) 0.65 % SOLN nasal spray Place 1 spray into both nostrils as needed for congestion. 12/02/15  Yes [name], MD  tamsulosin (FLOMAX) 0.4  MG CAPS capsule Take 0.4 mg by mouth daily.  11/26/13  Yes [provider]  traZODone (DESYREL) 50 MG tablet Take 50 mg by mouth at bedtime.   Yes [provider]  triamcinolone cream (KENALOG) 0.1 % Apply 1 application topically every 12 (twelve) hours as needed (for rash).   Yes [provider]  vitamin B-12 (CYANOCOBALAMIN) 1000 MCG tablet Take 1,000 mcg by mouth daily.   Yes [provider]  Vitamins A & D (VITAMIN A & D) ointment Apply 1 application topically 3 (three) times daily. APPLY TO LEFT HEEL   Yes [provider]  clindamycin (CLEOCIN) 150 MG capsule Take 3 capsules (450 mg total) by mouth 3 (three) times daily. Patient not taking: Reported on 03/25/2017 02/22/17   Quintella Reichert, MD  diazepam (VALIUM) 5 MG tablet Take 1 tablet (5 mg total) by mouth every 8 (eight) hours as needed for anxiety or muscle spasms. Patient not taking: Reported on 03/25/2017 12/02/15   [name], MD  famotidine (PEPCID) 20 MG tablet Take 1 tablet (20 mg total) by mouth 2 (two) times daily. Patient not taking: Reported on 03/25/2017 02/23/17   Jola Schmidt, MD  metFORMIN (GLUCOPHAGE) 500 MG tablet Take 1 tablet (500 mg total) by mouth 2 (two) times daily with a meal. Patient not taking: Reported on 03/25/2017 12/02/15   [name], MD  polyethylene glycol (MIRALAX / Floria Raveling) packet Take 17 g by mouth daily. Hold if diarrhea Patient not taking: Reported on 03/25/2017 12/02/15   [name], MD  pregabalin (LYRICA) 75 MG capsule Take 1 capsule (75 mg total) by mouth every 8 (eight) hours. Patient not taking: Reported on 03/25/2017 12/02/15   [name], MD  tizanidine (ZANAFLEX) 2 MG capsule Take 1 capsule (2 mg total) by mouth 3 (three) times daily as needed for muscle spasms. Patient not taking: Reported on 03/25/2017 12/02/15   [name], MD    Physical Exam: Vitals:   03/25/17 1408 03/25/17 1409 03/25/17 1540 03/25/17 1700  BP: (!) 150/86 (!) 142/87 122/68 127/85  Pulse: 84 85  75 82  Resp: 18 16    Temp:  99.3 F (37.4 C)    TempSrc:  Oral    SpO2: 97% 97% 96% 91%  Weight:  99.8 kg (220 lb)        Constitutional: uncomfortable  appearing in bed Vitals:   03/25/17 1408 03/25/17 1409 03/25/17 1540 03/25/17 1700  BP: (!) 150/86 (!) 142/87 122/68 127/85  Pulse: 84 85 75 82  Resp: 18 16    Temp:  99.3 F (37.4 C)    TempSrc:  Oral    SpO2: 97% 97% 96% 91%  Weight:  99.8 kg (220 lb)     Eyes: large lesion above right eye lid ENMT: Poor dentation  Neck: normal, supple, no masses, no thyromegaly Respiratory: clear to auscultation bilaterally, no wheezing, no crackles. Normal respiratory effort. No accessory muscle use.  Cardiovascular: Regular rate and rhythm, no murmurs / rubs / gallops.  Abdomen: no tenderness, no masses palpated. No hepatosplenomegaly. Bowel sounds positive.  Musculoskeletal: foot drop b/l  Skin: multiple lesion on legs and abdomen, right foot: with red/infected appearing second toe, wound on lateral aspect of foot and heel Left foot: with redness and scaling on big toe Psychiatric: Normal judgment and insight. Alert and oriented x 3. Normal mood.     Labs on Admission: I have personally reviewed following labs and imaging studies  CBC:  Recent Labs Lab 03/25/17 1533  WBC 7.8  NEUTROABS 4.9  HGB 10.2*  HCT 33.1*  MCV 86.6  PLT 664   Basic Metabolic Panel:  Recent Labs Lab 03/25/17 1533  NA 135  K 4.4  CL 94*  CO2 29  GLUCOSE 146*  BUN 20  CREATININE 1.93*  CALCIUM 9.2   GFR: Estimated Creatinine Clearance: 51.7 mL/min (A) (by C-G formula based on SCr of 1.93 mg/dL (H)). Liver Function Tests:  Recent Labs Lab 03/25/17 1533  AST 30  ALT 19  ALKPHOS 90  BILITOT 0.7  PROT 7.7  ALBUMIN 3.2*   No results for input(s): LIPASE, AMYLASE in the last 168 hours. No results for input(s): AMMONIA in the last 168 hours. Coagulation Profile: No results for input(s): INR, PROTIME in the last 168 hours. Cardiac  Enzymes: No results for input(s): CKTOTAL, CKMB, CKMBINDEX, TROPONINI in the last 168 hours. BNP (last 3 results) No results for input(s): PROBNP in the last 8760 hours. HbA1C: No results for input(s): HGBA1C in the last 72 hours. CBG: No results for input(s): GLUCAP in the last 168 hours. Lipid Profile: No results for input(s): CHOL, HDL, LDLCALC, TRIG, CHOLHDL, LDLDIRECT in the last 72 hours. Thyroid Function Tests: No results for input(s): TSH, T4TOTAL, FREET4, T3FREE, THYROIDAB in the last 72 hours. Anemia Panel: No results for input(s): VITAMINB12, FOLATE, FERRITIN, TIBC, IRON, RETICCTPCT in the last 72 hours. Urine analysis:    Component Value Date/Time   COLORURINE YELLOW 01/12/2017 [place] 01/12/2017 1629   LABSPEC 1.006 01/12/2017 1629   PHURINE 6.0 01/12/2017 1629   GLUCOSEU NEGATIVE 01/12/2017 1629   HGBUR MODERATE (A) 01/12/2017 1629   BILIRUBINUR NEGATIVE 01/12/2017 1629   KETONESUR NEGATIVE 01/12/2017 1629   PROTEINUR NEGATIVE 01/12/2017 1629   UROBILINOGEN 1.0 02/03/2014 0052   NITRITE POSITIVE (A) 01/12/2017 1629   LEUKOCYTESUR MODERATE (A) 01/12/2017 1629   Sepsis Labs: Invalid input(s): PROCALCITONIN, LACTICIDVEN No results found for this or any previous visit (from the past 240 hour(s)).   Radiological Exams on Admission: Dg Foot Complete Right  Result Date: 03/25/2017 CLINICAL DATA:  56 year old male with lower extremity wounds and pain. EXAM: RIGHT FOOT COMPLETE - 3+ VIEW COMPARISON:  Foot radiographs 10/02/2016 and earlier. FINDINGS: Interval improved and largely normalized bone mineralization at the posterior calcaneus where osteolysis was noted in April. However, there is  new cortical osteolysis and destruction of the head of the right second proximal phalanx (arrows). There is involvement of the right PIP and likely also the base of the second middle phalanx. No other cortical osteolysis in the right foot. There is increased distal foot  soft tissue swelling. No subcutaneous gas. IMPRESSION: 1. Positive for Osteomyelitis and septic joint of the right second PIP. Destruction of the head of the right second proximal phalanx since April. 2. Associated increased distal foot soft tissue swelling. No subcutaneous gas. 3. Improved appearance of the dorsal calcaneus since April. Electronically Signed   By: Genevie Ann M.D.   On: 03/25/2017 15:29     Assessment/Plan Active Problems:   Foot drop, bilateral   Type II diabetes mellitus with neurological manifestations (HCC)   Dyslipidemia associated with type 2 diabetes mellitus (HCC)   Depression, recurrent (HCC)   Osteomyelitis (HCC)   Skin cancer   Osteomyelitis -seen on x ray -defer to Dr. Doran Durand if MRI needed -ABI ordered per orderset -IV abx per orderset -blood cultures pending  DM -SSI -will need tight control -diabetic diet when no longer NPO  Depression -resume home meds  Skin cancer -has outpatient follow up -due to have sentinal node biopsy per daughter -?melanoma?-- per family but appears to be more of a squamous/basal cell in appearance  Foot drop -patient from SNF and no longer ambulatory  DVT prophylaxis: heparin Code Status: full Family Communication: daughter- RN at [employer] Disposition Plan: back to SNF Consults called: [place] Admission status: inpt   [place] DO [facility] Pager [phone]  If 7PM-7AM, please contact night-coverage [email or web address] Password [email or web address]  03/25/2017, 5:47 PM

## 2017-03-25 NOTE — ED Triage Notes (Signed)
Pt arrived via [facility] from [facility] with complaints of diabetic foot infection with pain 10/10. Patient has 3 wounds to right foot (toe, outer pinky, heel) and 1 wound to Left toe. Pt reports facility sent him in due to inability to control pain with medication. Pt reports taking 60 oxycontin BID.

## 2017-03-26 ENCOUNTER — Inpatient Hospital Stay (HOSPITAL_COMMUNITY): Payer: Medicaid Other

## 2017-03-26 ENCOUNTER — Encounter (HOSPITAL_COMMUNITY): Payer: Medicaid Other

## 2017-03-26 DIAGNOSIS — M86171 Other acute osteomyelitis, right ankle and foot: Secondary | ICD-10-CM

## 2017-03-26 LAB — CBC WITH DIFFERENTIAL/PLATELET
Basophils Absolute: 0 10*3/uL (ref 0.0–0.1)
Basophils Relative: 1 %
EOS ABS: 0.2 10*3/uL (ref 0.0–0.7)
EOS PCT: 3 %
HCT: 30.2 % — ABNORMAL LOW (ref 39.0–52.0)
Hemoglobin: 9.5 g/dL — ABNORMAL LOW (ref 13.0–17.0)
LYMPHS ABS: 1.7 10*3/uL (ref 0.7–4.0)
Lymphocytes Relative: 24 %
MCH: 27.6 pg (ref 26.0–34.0)
MCHC: 31.5 g/dL (ref 30.0–36.0)
MCV: 87.8 fL (ref 78.0–100.0)
MONOS PCT: 9 %
Monocytes Absolute: 0.6 10*3/uL (ref 0.1–1.0)
Neutro Abs: 4.3 10*3/uL (ref 1.7–7.7)
Neutrophils Relative %: 63 %
PLATELETS: 298 10*3/uL (ref 150–400)
RBC: 3.44 MIL/uL — ABNORMAL LOW (ref 4.22–5.81)
RDW: 16.4 % — ABNORMAL HIGH (ref 11.5–15.5)
WBC: 6.8 10*3/uL (ref 4.0–10.5)

## 2017-03-26 LAB — BASIC METABOLIC PANEL
Anion gap: 11 (ref 5–15)
BUN: 18 mg/dL (ref 6–20)
CHLORIDE: 97 mmol/L — AB (ref 101–111)
CO2: 30 mmol/L (ref 22–32)
CREATININE: 1.87 mg/dL — AB (ref 0.61–1.24)
Calcium: 8.9 mg/dL (ref 8.9–10.3)
GFR calc Af Amer: 44 mL/min — ABNORMAL LOW (ref >60)
GFR calc non Af Amer: 38 mL/min — ABNORMAL LOW (ref >60)
Glucose, Bld: 215 mg/dL — ABNORMAL HIGH (ref 65–99)
Potassium: 3.4 mmol/L — ABNORMAL LOW (ref 3.5–5.1)
SODIUM: 138 mmol/L (ref 135–145)

## 2017-03-26 LAB — GLUCOSE, CAPILLARY
GLUCOSE-CAPILLARY: 132 mg/dL — AB (ref 65–99)
GLUCOSE-CAPILLARY: 155 mg/dL — AB (ref 65–99)
Glucose-Capillary: 169 mg/dL — ABNORMAL HIGH (ref 65–99)
Glucose-Capillary: 255 mg/dL — ABNORMAL HIGH (ref 65–99)

## 2017-03-26 LAB — SURGICAL PCR SCREEN
MRSA, PCR: NEGATIVE
Staphylococcus aureus: POSITIVE — AB

## 2017-03-26 LAB — HIV ANTIBODY (ROUTINE TESTING W REFLEX): HIV SCREEN 4TH GENERATION: NONREACTIVE

## 2017-03-26 MED ORDER — OXYCODONE-ACETAMINOPHEN 5-325 MG PO TABS
1.0000 | ORAL_TABLET | Freq: Three times a day (TID) | ORAL | Status: DC | PRN
  Administered 2017-03-27: 2 via ORAL
  Filled 2017-03-26: qty 2

## 2017-03-26 MED ORDER — MUPIROCIN 2 % EX OINT
1.0000 "application " | TOPICAL_OINTMENT | Freq: Two times a day (BID) | CUTANEOUS | Status: AC
  Administered 2017-03-26 – 2017-03-30 (×8): 1 via NASAL
  Filled 2017-03-26: qty 22

## 2017-03-26 MED ORDER — CHLORHEXIDINE GLUCONATE CLOTH 2 % EX PADS
6.0000 | MEDICATED_PAD | Freq: Every day | CUTANEOUS | Status: AC
  Administered 2017-03-26 – 2017-03-27 (×2): 6 via TOPICAL

## 2017-03-26 MED ORDER — ADULT MULTIVITAMIN W/MINERALS CH
1.0000 | ORAL_TABLET | Freq: Every day | ORAL | Status: DC
  Administered 2017-03-26 – 2017-04-02 (×8): 1 via ORAL
  Filled 2017-03-26 (×8): qty 1

## 2017-03-26 MED ORDER — BOOST / RESOURCE BREEZE PO LIQD
1.0000 | Freq: Two times a day (BID) | ORAL | Status: DC
  Administered 2017-03-26: 1 via ORAL

## 2017-03-26 MED ORDER — JUVEN PO PACK
1.0000 | PACK | Freq: Two times a day (BID) | ORAL | Status: DC
  Filled 2017-03-26: qty 1

## 2017-03-26 MED ORDER — OXYCODONE HCL 5 MG PO TABS
5.0000 mg | ORAL_TABLET | Freq: Once | ORAL | Status: AC
  Administered 2017-03-26: 5 mg via ORAL
  Filled 2017-03-26: qty 1

## 2017-03-26 NOTE — Progress Notes (Signed)
VASCULAR LAB PRELIMINARY  ARTERIAL  ABI completed:    RIGHT    LEFT    PRESSURE WAVEFORM  PRESSURE WAVEFORM  BRACHIAL 122 Triphasic BRACHIAL 122 Triphasic  DP 129 Triphasic DP 131 Triphasic  PT 141 Triphasic PT 122 Triphasic    RIGHT LEFT  ABI 1.16 1.07   ABIs and Doppler waveforms indicate normal arterial flow bilaterally at rest. Great toe pressures could not be evaluated due to anatomy of the toes.  Brave Dack, [place], RVS 03/26/2017, 5:36 PM

## 2017-03-26 NOTE — Progress Notes (Signed)
Initial Nutrition Assessment  DOCUMENTATION CODES:   Not applicable  INTERVENTION:   -MVI daily -Juven BID  NUTRITION DIAGNOSIS:   Increased nutrient needs related to wound healing as evidenced by estimated needs.  GOAL:   Patient will meet greater than or equal to 90% of their needs  MONITOR:   PO intake, Supplement acceptance, Labs, Weight trends, Skin, I & O's  REASON FOR ASSESSMENT:   Consult Wound healing  ASSESSMENT:   56 y/o male with complex PMH including diabetes and osteomyelitis of the spine complicated by abscess.   Per orthopedics notes, pt with lt hallux ulcers with gross deformity at the IP joint and rt foot chronic DM ulcers and gross equinovarus foot deformity.   Pt unavailable at time of visit. Unable to obtain further nutrition hx or perform nutrition-focused physical exam.   Review[facility]. Pt on a HSG, NAS diet PTA. Notable PTA medications include MVI, KCl, vitamin B-12, vitamin D-3, magnesium, and furosemide. Last Hgb A1c: 7.2, which demonstrates fair control especially in thw presence of infections. Home DM medications 850 mg metformin BID and 0-10 units insulin lispro TID with meals.   Reviewed wt hx; wt has been stable.   Pt with good appetite; meal completion 100%.   Labs reviewed: K: 3.4 (on PO supplementation), CBGS: 132-154 (inpatient orders for glycemic control are 0-9 units insulin aspart TID and 0-5 units insulin aspart q HS).   Diet Order:  Diet Carb Modified Fluid consistency: Thin; Room service appropriate? Yes  Skin:  Wound (see comment) (DM ulcers- lt hallus and rt foot with osteomyelitis)  Last BM:  03/25/17  Height:   Ht Readings from Last 1 Encounters:  03/25/17 6' (1.829 m)    Weight:   Wt Readings from Last 1 Encounters:  03/25/17 220 lb 3.8 oz (99.9 kg)    Ideal Body Weight:  80.9 kg  BMI:  Body mass index is 29.87 kg/m.  Estimated Nutritional Needs:   Kcal:  2200-2400  Protein:   130-145 grams  Fluid:  > 2.2 L  EDUCATION NEEDS:   Education needs no appropriate at this time  Gabriela Irigoyen A. Jimmye Norman, RD, LDN, CDE Pager: [phone] After hours Pager: [phone]

## 2017-03-26 NOTE — Progress Notes (Signed)
Inpatient Diabetes Program Recommendations  AACE/ADA: New Consensus Statement on Inpatient Glycemic Control (2015)  Target Ranges:  Prepandial:   less than 140 mg/dL      Peak postprandial:   less than 180 mg/dL (1-2 hours)      Critically ill patients:  140 - 180 mg/dL   Lab Results  Component Value Date   GLUCAP 132 (H) 03/26/2017   HGBA1C 7.2 (H) 03/25/2017    Review of Glycemic Control  Diabetes history: DM2 Outpatient Diabetes medications: Metformin 850 mg bid + Humalog Correction scale Current orders for Inpatient glycemic control: Novolog correction 0-9 units tid + 0-5 units hs  Inpatient Diabetes Program Recommendations:  Received referral due to lower extremity wound. A1c 7.2 on current regimen. Will follow during hospitalization.  Thank you, Nani Gasser. Carla Rashad, RN, MSN, CDE  Diabetes Coordinator Inpatient Glycemic Control Team Team Pager [phone] (8am-5pm) 03/26/2017 11:41 AM

## 2017-03-26 NOTE — Progress Notes (Signed)
Subjective: Pt continues to have significant pain in the R LE.  On multiple abx.  No n/v/f/c.  Pain improved with percocet.  MRIs completed.  Tolerating regular diet.   Objective: Vital signs in last 24 hours: Temp:  [97.9 F (36.6 C)-98.4 F (36.9 C)] 98.4 F (36.9 C) (10/09 1451) Pulse Rate:  [74-86] 74 (10/09 1451) Resp:  [17-18] 18 (10/09 1451) BP: (119-155)/(63-93) 155/78 (10/09 1451) SpO2:  [91 %-97 %] 97 % (10/09 1451) Weight:  [99.9 kg (220 lb 3.8 oz)] 99.9 kg (220 lb 3.8 oz) (10/08 1832)  Intake/Output from previous day: 10/08 0701 - 10/09 0700 In: 460 [P.O.:360; IV Piggyback:100] Out: 1000 [Urine:1000] Intake/Output this shift: Total I/O In: 720 [P.O.:720] Out: 1125 [Urine:1125]   Recent Labs  03/25/17 1533 03/26/17 0254  HGB 10.2* 9.5*    Recent Labs  03/25/17 1533 03/26/17 0254  WBC 7.8 6.8  RBC 3.82* 3.44*  HCT 33.1* 30.2*  PLT 326 298    Recent Labs  03/25/17 1533 03/26/17 0254  NA 135 138  K 4.4 3.4*  CL 94* 97*  CO2 29 30  BUN 20 18  CREATININE 1.93* 1.87*  GLUCOSE 146* 215*  CALCIUM 9.2 8.9   No results for input(s): LABPT, INR in the last 72 hours.  PE:  wn wd male in nad.  A and O x 4.  R foot with ulcers at platnar lateral forefoot, heel and 2nd toe.  Erythema improved.  Equinovarus deformity of R LE is stable.  L LE hallux with swelling and valgus alignment that is stable.  MRI of right foot shows osteo of the 2nd toe, 5th toe and MT and calcaneus.  MRI of the left forefoot shows osteo of the hallux  Assessment/Plan: R foot diabetic ulcers and osteo - pt is a candidate for BKA in an attempt to eradicate the infection.  L hallux diabetic ulcer and osteo - pt is a candidate for hallux amputation to treat the infection.  We discussed the treatment options in detail.  I've discussed the patient with [name].  He will be around to see the patient in the morning to plan surgical treatment on this admission.  If the patient wishes to  defer surgical treatment until next week, I can get him scheduled then.  I've updated the patient's daughter by phone.    I spend more than 30 min in care of this patient including reviewing studies, H and P and coordinating care.   Wylene Simmer 03/26/2017, 4:46 PM

## 2017-03-26 NOTE — Consult Note (Signed)
WOC consult requested for right foot wound prior to ortho service involvement. X-rays indicate;  " Positive for Osteomyelitis and septic joint of the right second PIP. Destruction of the head of the right second proximal phalanx". This complex medical condition is beyond [facility] scope of practice. Dr Doran Durand is now following for assessment and plan of care and plans for possible surgery.  Please refer to their team for further questions.  Please re-consult if further assistance is needed.  Thank-you,  Julien Girt MSN, [place], [place], [place], [place]

## 2017-03-26 NOTE — Progress Notes (Signed)
PROGRESS NOTE    Gregory [name]  DQQ:[id number] DOB: [birth year] DOA: 03/25/2017 PCP: Patient, No Pcp Per   Specialists:     Brief Narrative:  5 wm Copd htn  Dm Chr LBP H/o  MRSA + OSteo Cervical/Lumbar spine   Recent stay at [facility] for protracted illness--was intubated 2/2 to either PNa or oversedation  Underwent c3-7 laminectomy, post intrumentation, Arthrodeses  Has been on Chr doxy  R foot showed Chr Osteo   Assessment & Plan:   Active Problems:   Foot drop, bilateral   Type II diabetes mellitus with neurological manifestations ([place])   Dyslipidemia associated with type 2 diabetes mellitus (HCC)   Depression, recurrent (HCC)   Osteomyelitis (HCC)   Skin cancer   Osteomyelitis R foot 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 For now    Lovenox Full code No family Inpatient pending resolution and surgery?   Consultants:   Dr. Doran Durand  Procedures:   None yet  Antimicrobials:   Vancomycin + ceftriaxone 10/8    Subjective: Alert oriented in pain Just back from MRI Eating and drinking Somewhat constipated-just had a stool States that pain is 8/10 but appears comfortable at the bedside No chest pain No fever No chills  Objective: Vitals:   03/25/17 1800 03/25/17 1832 03/25/17 2030 03/26/17 0435  BP: (!) 140/93 125/77 137/78 119/63  Pulse: 74 76 86 74  Resp:  18  17  Temp:  98.2 F (36.8 C) 98.4 F (36.9 C) 97.9 F (36.6 C)  TempSrc:  Oral Oral Oral  SpO2:  97% 97% 92%  Weight:  99.9 kg (220 lb 3.8 oz)    Height:  6' (1.829 m)      Intake/Output Summary (Last 24 hours) at 03/26/17 1435 Last data filed at 03/26/17 1300  Gross per 24 hour  Intake              820 ml  Output             2125 ml  Net            -1305 ml   Filed Weights   03/25/17 1409 03/25/17 1832  Weight: 99.8 kg (220 lb) 99.9 kg (220 lb 3.8 oz)    Examination:  External ocular movements intact NCAT Thick neck, Mallampati 4 No JVD, no bruit S1-S2 slightly tachycardic sinus Chest clinically  clear no added sound      Data Reviewed: I have personally reviewed following labs and imaging studies  CBC:  Recent Labs Lab 03/25/17 1533 03/26/17 0254  WBC 7.8 6.8  NEUTROABS 4.9 4.3  HGB 10.2* 9.5*  HCT 33.1* 30.2*  MCV 86.6 87.8  PLT 326 510   Basic Metabolic Panel:  Recent Labs Lab 03/25/17 1533 03/26/17 0254  NA 135 138  K 4.4 3.4*  CL 94* 97*  CO2 29 30  GLUCOSE 146* 215*  BUN 20 18  CREATININE 1.93* 1.87*  CALCIUM 9.2 8.9   GFR: Estimated Creatinine Clearance: 53.3 mL/min (A) (by C-G formula based on SCr of 1.87 mg/dL (H)). Liver Function Tests:  Recent Labs Lab 03/25/17 1533  AST 30  ALT 19    ALKPHOS 90  BILITOT 0.7  PROT 7.7  ALBUMIN 3.2*   No results for input(s): LIPASE, AMYLASE in the last 168 hours. No results for input(s): AMMONIA in the last 168 hours. Coagulation Profile: No results for input(s): INR, PROTIME in the last 168 hours. Cardiac Enzymes: No results for input(s): CKTOTAL, CKMB, CKMBINDEX, TROPONINI in the last 168 hours. BNP (last 3 results) No results for input(s): PROBNP in the last 8760 hours. HbA1C:  Recent Labs  03/25/17 1725  HGBA1C 7.2*   CBG:  Recent Labs Lab 03/25/17 2027 03/26/17 0735 03/26/17 1312  GLUCAP 154* 132* 155*   Lipid Profile: No results for input(s): CHOL, HDL, LDLCALC, TRIG, CHOLHDL, LDLDIRECT in the last 72 hours. Thyroid Function Tests: No results for input(s): TSH, T4TOTAL, FREET4, T3FREE, THYROIDAB in the last 72 hours. Anemia Panel: No results for input(s): VITAMINB12, FOLATE, FERRITIN, TIBC, IRON, RETICCTPCT in the last 72 hours. Urine analysis:    Component Value Date/Time   COLORURINE YELLOW 01/12/2017 [place] 01/12/2017 1629   LABSPEC 1.006 01/12/2017 1629   PHURINE 6.0 01/12/2017 1629   GLUCOSEU NEGATIVE 01/12/2017 1629   HGBUR MODERATE (A) 01/12/2017 1629   BILIRUBINUR NEGATIVE 01/12/2017 [place] 01/12/2017 1629   PROTEINUR NEGATIVE 01/12/2017 1629   UROBILINOGEN 1.0 02/03/2014 0052   NITRITE POSITIVE (A) 01/12/2017 1629   LEUKOCYTESUR MODERATE (A) 01/12/2017 1629     Radiology Studies: Reviewed images personally in health database    Scheduled Meds: . buPROPion  450 mg Oral Daily  . Chlorhexidine Gluconate Cloth  6 each Topical Daily  . cholecalciferol  5,000 Units Oral Daily  . docusate sodium  100 mg Oral BID  . feeding supplement  1 Container Oral BID BM  . fluticasone  1 spray Each Nare BID  . furosemide  40 mg Oral BID  . gabapentin  400 mg Oral TID  . heparin  5,000 Units Subcutaneous Q8H  . insulin aspart  0-5 Units Subcutaneous QHS  .  insulin aspart  0-9 Units Subcutaneous TID WC  . magnesium oxide  400 mg Oral BID  . metoprolol tartrate  25 mg Oral Daily  . multivitamin with minerals  1 tablet Oral Daily  . mupirocin ointment  1 application Nasal BID  . oxyCODONE  60 mg Oral Q12H  . pantoprazole  40 mg Oral Daily  . PARoxetine  40 mg Oral BH-q7a  . potassium chloride  20 mEq Oral Daily  . tamsulosin  0.4 mg Oral Daily  . traZODone  50 mg Oral QHS  . vitamin A & D  1 application Topical TID  . vitamin B-12  1,000 mcg Oral Daily  . zinc oxide  1 application Topical See admin instructions   Continuous Infusions: . cefTRIAXone (ROCEPHIN)  IV     And  . metronidazole Stopped (03/26/17 1005)  . vancomycin       LOS: 1 day    Time spent: [place], MD [name]   If 7PM-7AM, please contact night-coverage [email or web address] Password [email or web address] 03/26/2017, 2:35 PM

## 2017-03-27 DIAGNOSIS — F339 Major depressive disorder, recurrent, unspecified: Secondary | ICD-10-CM

## 2017-03-27 DIAGNOSIS — E785 Hyperlipidemia, unspecified: Secondary | ICD-10-CM

## 2017-03-27 DIAGNOSIS — M86171 Other acute osteomyelitis, right ankle and foot: Secondary | ICD-10-CM

## 2017-03-27 DIAGNOSIS — M86472 Chronic osteomyelitis with draining sinus, left ankle and foot: Secondary | ICD-10-CM

## 2017-03-27 DIAGNOSIS — M21371 Foot drop, right foot: Secondary | ICD-10-CM

## 2017-03-27 DIAGNOSIS — M21372 Foot drop, left foot: Secondary | ICD-10-CM

## 2017-03-27 DIAGNOSIS — E1169 Type 2 diabetes mellitus with other specified complication: Principal | ICD-10-CM

## 2017-03-27 LAB — GLUCOSE, CAPILLARY
GLUCOSE-CAPILLARY: 153 mg/dL — AB (ref 65–99)
GLUCOSE-CAPILLARY: 122 mg/dL — AB (ref 65–99)
Glucose-Capillary: 217 mg/dL — ABNORMAL HIGH (ref 65–99)
Glucose-Capillary: 166 mg/dL — ABNORMAL HIGH (ref 65–99)

## 2017-03-27 MED ORDER — OXYCODONE HCL 5 MG PO TABS
7.5000 mg | ORAL_TABLET | Freq: Four times a day (QID) | ORAL | Status: DC | PRN
  Administered 2017-03-27 – 2017-03-29 (×6): 7.5 mg via ORAL
  Filled 2017-03-27 (×6): qty 2

## 2017-03-27 NOTE — Progress Notes (Signed)
PROGRESS NOTE    Gregory [name]  WIO:[id number] DOB: [birth year] DOA: 03/25/2017 PCP: Patient, No Pcp Per   Specialists:     Brief Narrative:  20 wm Copd htn  Dm Chr LBP H/o  MRSA + OSteo Cervical/Lumbar spine   Recent stay at [facility] for protracted illness--was intubated 2/2 to either PNa or oversedation  Underwent c3-7 laminectomy, post intrumentation, Arthrodeses  Has been on Chr doxy  R foot showed Chr Osteo   Assessment & Plan:   Active Problems:   Foot drop, bilateral   Type II diabetes mellitus with neurological manifestations ([place])   Dyslipidemia associated with type 2 diabetes mellitus (HCC)   Depression, recurrent (HCC)   Osteomyelitis (HCC)   Skin cancer   Osteomyelitis R foot 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  MRI of the lower extremity shows osteomyelitis right foot-Dr [name] planning right transtibial amputation, left great toe  amputation  Review of Percocet 1-2->oxycodoen 7.5  For now continue vancomycin + ceftriaxone--await cultures   Chronic pain Lumbar degenerative disc disease with MRSA-status post operation [facility]  Continue OxyContin 60 every 12  May continue morphine 2-4 every 4 when necessary severe pain  Wellbutrin 450 daily--will help with neuropathic component to the pain  Continue gabapentin 400 3 times a day  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 For now  Malignant melanoma above right eyebrow, diagnosed about 6 months ago  Follows with a dermatologist in [facility] Was scheduled for follow-up 10/10--this will have to be deferred and delayed at least  10 days to 14 days and I have explained this clearly to the daughter who will contact dermatologist -I have also discussed with the daughter that we would not typically order PET scans and other workup in the hospital   Lovenox Full code No family Inpatient pending resolution and surgery?   Consultants:   Dr. Doran Durand  Procedures:   None yet  Antimicrobials:   Vancomycin + ceftriaxone 10/8    Subjective: Alert oriented in pain Just back from MRI Eating and drinking Somewhat constipated-just had a stool States that pain is 8/10 but appears comfortable at the bedside No chest pain No fever No chills  Objective: Vitals:   03/26/17 1451 03/26/17 2128 03/27/17 0511 03/27/17 1428  BP: (!) 155/78 122/67 103/71 111/74  Pulse: 74 68 86 68  Resp: 18 18 18 18   Temp: 98.4 F (36.9 C) 97.8 F (36.6 C) 97.9 F (36.6 C) 97.8 F (36.6 C)  TempSrc: Oral Oral Oral Oral  SpO2: 97% 95% 97% 96%  Weight:      Height:        Intake/Output Summary (Last 24 hours) at 03/27/17 1706 Last data filed at 03/27/17 1616  Gross per 24 hour  Intake             1662 ml  Output             2750 ml  Net            -1088 ml   Filed Weights   03/25/17 1409 03/25/17 1832  Weight: 99.8 kg (220 lb) 99.9 kg (220 lb 3.8 oz)  Examination:  External ocular movements intact NCAT In nad Eating well S1-S2 nsr to exam Chest clinically clear no added sound Feet not examined today   Data Reviewed: I have personally reviewed following labs and imaging studies  CBC:  Recent Labs Lab 03/25/17 1533 03/26/17 0254  WBC 7.8 6.8  NEUTROABS 4.9 4.3  HGB 10.2* 9.5*  HCT 33.1* 30.2*  MCV 86.6 87.8  PLT 326 983   Basic Metabolic Panel:  Recent Labs Lab 03/25/17 1533 03/26/17 0254  NA 135 138  K 4.4 3.4*  CL 94* 97*  CO2 29 30  GLUCOSE 146* 215*  BUN 20 18  CREATININE 1.93* 1.87*  CALCIUM 9.2 8.9   GFR: Estimated Creatinine Clearance: 53.3 mL/min (A) (by C-G formula based on SCr  of 1.87 mg/dL (H)). Liver Function Tests:  Recent Labs Lab 03/25/17 1533  AST 30  ALT 19  ALKPHOS 90  BILITOT 0.7  PROT 7.7  ALBUMIN 3.2*   No results for input(s): LIPASE, AMYLASE in the last 168 hours. No results for input(s): AMMONIA in the last 168 hours. Coagulation Profile: No results for input(s): INR, PROTIME in the last 168 hours. Cardiac Enzymes: No results for input(s): CKTOTAL, CKMB, CKMBINDEX, TROPONINI in the last 168 hours. BNP (last 3 results) No results for input(s): PROBNP in the last 8760 hours. HbA1C:  Recent Labs  03/25/17 1725  HGBA1C 7.2*   CBG:  Recent Labs Lab 03/26/17 1312 03/26/17 1825 03/26/17 2127 03/27/17 0731 03/27/17 1221  GLUCAP 155* 169* 255* 153* 217*   Lipid Profile: No results for input(s): CHOL, HDL, LDLCALC, TRIG, CHOLHDL, LDLDIRECT in the last 72 hours. Thyroid Function Tests: No results for input(s): TSH, T4TOTAL, FREET4, T3FREE, THYROIDAB in the last 72 hours. Anemia Panel: No results for input(s): VITAMINB12, FOLATE, FERRITIN, TIBC, IRON, RETICCTPCT in the last 72 hours. Urine analysis:    Component Value Date/Time   COLORURINE YELLOW 01/12/2017 [place] 01/12/2017 1629   LABSPEC 1.006 01/12/2017 1629   PHURINE 6.0 01/12/2017 1629   GLUCOSEU NEGATIVE 01/12/2017 1629   HGBUR MODERATE (A) 01/12/2017 1629   BILIRUBINUR NEGATIVE 01/12/2017 [place] 01/12/2017 1629   PROTEINUR NEGATIVE 01/12/2017 1629   UROBILINOGEN 1.0 02/03/2014 0052   NITRITE POSITIVE (A) 01/12/2017 1629   LEUKOCYTESUR MODERATE (A) 01/12/2017 1629     Radiology Studies: Reviewed images personally in health database    Scheduled Meds: . buPROPion  450 mg Oral Daily  . Chlorhexidine Gluconate Cloth  6 each Topical Daily  . cholecalciferol  5,000 Units Oral Daily  . docusate sodium  100 mg Oral BID  . feeding supplement  1 Container Oral BID BM  . fluticasone  1 spray Each Nare BID  . furosemide  40 mg  Oral BID  . gabapentin  400 mg Oral TID  . heparin  5,000 Units Subcutaneous Q8H  . insulin aspart  0-5 Units Subcutaneous QHS  . insulin aspart  0-9 Units Subcutaneous TID WC  . magnesium oxide  400 mg Oral BID  . metoprolol tartrate  25 mg Oral Daily  . multivitamin with minerals  1 tablet Oral Daily  . mupirocin ointment  1 application Nasal BID  . oxyCODONE  60 mg Oral Q12H  . pantoprazole  40 mg Oral Daily  . PARoxetine  40 mg Oral BH-q7a  . potassium chloride  20 mEq Oral Daily  . tamsulosin  0.4 mg Oral Daily  . traZODone  50 mg Oral QHS  .  vitamin A & D  1 application Topical TID  . vitamin B-12  1,000 mcg Oral Daily  . zinc oxide  1 application Topical See admin instructions   Continuous Infusions: . cefTRIAXone (ROCEPHIN)  IV 2 g (03/27/17 1701)   And  . metronidazole 500 mg (03/27/17 1701)  . vancomycin Stopped (03/26/17 1957)     LOS: 2 days    Time spent: [place], MD [name]   If 7PM-7AM, please contact night-coverage [email or web address] Password [email or web address] 03/27/2017, 5:06 PM

## 2017-03-27 NOTE — Clinical Social Work Note (Signed)
Clinical Social Work Assessment  Patient Details  Name: Gregory [name] MRN: [id number] Date of Birth: August 10, 1960  Date of referral:  03/26/17               Reason for consult:  Facility Placement                Permission sought to share information with:  Chartered certified accountant granted to share information::  Yes, Verbal Permission Granted  Name::     Gregory [name]  Agency::  SNF- [facility]  Relationship::  daughter  Contact Information:     Housing/Transportation Living arrangements for the past 2 months:  [place] of Information:  Patient Patient Interpreter Needed:  None Criminal Activity/Legal Involvement Pertinent to Current Situation/Hospitalization:  No - Comment as needed Significant Relationships:  Adult Children Lives with:  Facility Resident Do you feel safe going back to the place where you live?  Yes Need for family participation in patient care:  Yes (Comment)  Care giving concerns:  Pt has resided at [facility] for aabout 15 months per patient. He intends to return to [facility] when he is medically ready. Pt indicated that CSW can speak with daughter if needed.   Social Worker assessment / plan:  CSW met with patient at bedside to discuss the plan to return to SNF at discharge. CSW will f/u with SNF and confirm readmittance. CSW will complete FL2, obtain passr and send offer to [facility].  Patient will need medical transport back to SNF when medically ready.  Employment status:  Disabled (Comment on whether or not currently receiving Disability) Insurance information:  Medicaid In [place] PT Recommendations:  [place] / Referral to community resources:  [place]  Patient/Family's Response to care:  Patient appreciative of CSW f/u about SNF placement. No issues or concerns identified.  Patient/Family's Understanding of and Emotional Response to Diagnosis,  Current Treatment, and Prognosis:  Patient has good understanding of diagnosis, current treatment and prognosis. Pt agreeable to return to SNF at discharge. No issues or concerns identified.  Emotional Assessment Appearance:  Appears older than stated age Attitude/Demeanor/Rapport:   (Cooperative) Affect (typically observed):  Accepting, Appropriate Orientation:  Oriented to Self, Oriented to Place, Oriented to  Time, Oriented to Situation Alcohol / Substance use:  Not Applicable Psych involvement (Current and /or in the community):  No (Comment)  Discharge Needs  Concerns to be addressed:  Care Coordination Readmission within the last 30 days:  Yes Current discharge risk:  Dependent with Mobility, Physical Impairment Barriers to Discharge:  No Barriers Identified   Normajean Baxter, LCSW 03/27/2017, 10:24 AM

## 2017-03-27 NOTE — Progress Notes (Signed)
Patient ID: Gregory [name], male   DOB: 23-Jan-1961, 56 y.o.   MRN: [id number] I discussed surgical versus nonsurgical options with the patient and family at bedside including his daughter. All questions were encouraged and answered. Patient and his family wish to proceed with a right transtibial amputation and left great toe amputation.  Patient has not walked in over a year. Discussed that his ability to walk with prosthesis will be limited by his strength. 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  Discussed that there is 10% mortality annually after the amputation.

## 2017-03-27 NOTE — Consult Note (Signed)
ORTHOPAEDIC CONSULTATION  REQUESTING PHYSICIAN: Arrien, Jimmy Picket,*  Chief Complaint: osteomyelitis right calcaneus and left great toe  HPI: Ranger Petrich is a 56 y.o. male who presents with chronic osteomyelitis right forefoot and right calcaneus with osteomyelitis of the left great toe. Patient states that the contractures with the equinus varus contracture in both lower extremities started when he was hospitalized for over a month. Patient states that he was very sick.patient reports a long history of diabetes.  Past Medical History:  Diagnosis Date  . Acute osteomyelitis of calcaneum, right ([place]) 10/02/2016  . Acute respiratory failure ([place])   . Anemia   . Basal cell carcinoma, eyelid   . Cancer ([place])   . Chronic back pain   . COPD (chronic obstructive pulmonary disease) ([employer])   . DDD (degenerative disc disease), lumbar   . Diabetes mellitus without complication ([place])   . Diabetic ulcer of toe of left foot ([place]) 10/02/2016  . 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 Foot drop, bilateral   . Foot drop, right 12/27/2015  . Hyperlipidemia   . Hypertension   . MRSA bacteremia   . Osteomyelitis ([place])   . Osteomyelitis ([place])   . Urinary retention   . Urinary retention    Past Surgical History:  Procedure Laterality Date  . ANTERIOR CERVICAL CORPECTOMY N/A 11/25/2015   Procedure: ANTERIOR CERVICAL FIVE CORPECTOMY Cervical four - six fusion;  Surgeon: Consuella Lose, MD;  Location: [facility];  Service: Neurosurgery;  Laterality: N/A;  ANTERIOR CERVICAL FIVE CORPECTOMY Cervical four - six fusion  . POSTERIOR CERVICAL FUSION/FORAMINOTOMY N/A 11/29/2015   Procedure: Cervical Three-Cervical Seven Posterior Cervical Laminectomy with Fusion;  Surgeon: Consuella Lose, MD;  Location: [facility];  Service: Neurosurgery;  Laterality: N/A;  Cervical Three-Cervical Seven Posterior Cervical Laminectomy with Fusion   Social History   Social History  . Marital status: Single    Spouse  name: N/A  . Number of children: N/A  . Years of education: N/A   Social History Main Topics  . Smoking status: Former Smoker    Packs/day: 3.00    Types: Cigarettes  . Smokeless tobacco: Never Used  . Alcohol use No  . Drug use: No  . Sexual activity: Not Asked   Other Topics Concern  . None   Social History Narrative  . None   Family History  Problem Relation Age of Onset  . Cancer Other   . Diabetes Other    - negative except otherwise stated in the family history section No Known Allergies Prior to Admission medications   Medication Sig Start Date End Date Taking? Authorizing Provider  albuterol (PROVENTIL HFA;VENTOLIN HFA) 108 (90 Base) MCG/ACT inhaler Inhale 2 puffs into the lungs every 6 (six) hours as needed for shortness of breath.    Yes [provider]  bisacodyl (DULCOLAX) 10 MG suppository Place 1 suppository (10 mg total) rectally daily as needed for moderate constipation. Patient taking differently: Place 10 mg rectally daily as needed (for constipation).  12/02/15  Yes [name], MD  BuPROPion HCl ER, XL, 450 MG TB24 Take 450 mg by mouth daily.    Yes [provider]  calcium carbonate (TUMS - DOSED IN MG ELEMENTAL CALCIUM) 500 MG chewable tablet Chew 2 tablets by mouth every 8 (eight) hours as needed for indigestion or heartburn.   Yes [provider]  Cholecalciferol (VITAMIN D-3) 5000 units TABS Take 5,000 Units by mouth daily.   Yes [provider]  cyclobenzaprine (  FLEXERIL) 10 MG tablet Take 10 mg by mouth every 8 (eight) hours as needed for muscle spasms.   Yes [provider]  diphenhydrAMINE (BENADRYL) 25 MG tablet Take 1 tablet (25 mg total) by mouth every 6 (six) hours. Patient taking differently: Take 25 mg by mouth every 6 (six) hours as needed (for Pruritus).  02/23/17  Yes Jola Schmidt, MD  docusate sodium (COLACE) 100 MG capsule Take 100 mg by mouth 2 (two) times daily.   Yes [provider]    fluticasone (FLONASE) 50 MCG/ACT nasal spray Place 1 spray into both nostrils 2 (two) times daily.   Yes [provider]  furosemide (LASIX) 40 MG tablet Take 40 mg by mouth 2 (two) times daily.   Yes [provider]  gabapentin (NEURONTIN) 400 MG capsule Take 400 mg by mouth 3 (three) times daily.   Yes [provider]  glucagon (GLUCAGON EMERGENCY) 1 MG injection Inject 1 mg into the muscle See admin instructions. FOR BGL LESS THAN 70   Yes [provider]  hydrocortisone cream 1 % Apply 1 application topically every 12 (twelve) hours as needed for itching. APPLY TO HANDS AND GROIN   Yes [provider]  hydrOXYzine (ATARAX/VISTARIL) 25 MG tablet Take 25 mg by mouth every 6 (six) hours as needed for itching.   Yes [provider]  insulin lispro (HUMALOG) 100 UNIT/ML injection Inject 0-10 Units into the skin See admin instructions. THREE TIMES A DAY BEFORE MEALS PER SLIDING SCALE: BGL 0-150 = 0 units; 151-200 = 2 units; 201-250 = 4 units; 251-300 = 6 units; 301-350 = 8 units; 351-400 = 10 units; 400 or greater = CALL MD/NP/PA   Yes [provider]  linezolid (ZYVOX) 600 MG tablet Take 600 mg by mouth every 12 (twelve) hours. FOR 10 DAYS 03/22/17 03/31/17 Yes [provider]  magnesium oxide (MAG-OX) 400 MG tablet Take 400 mg by mouth 2 (two) times daily.   Yes [provider]  metFORMIN (GLUCOPHAGE) 850 MG tablet Take 850 mg by mouth 2 (two) times daily.   Yes [provider]  metoprolol tartrate (LOPRESSOR) 25 MG tablet Take 25 mg by mouth daily.   Yes [provider]  Multiple Vitamins-Minerals (DECUBI-VITE PO) Take 1 tablet by mouth daily.   Yes [provider]  nystatin (MYCOSTATIN/NYSTOP) powder Apply 1 g topically 3 (three) times daily. APPLY TO GROIN AREA FOR YEAST   Yes [provider]  omeprazole (PRILOSEC) 20 MG capsule Take 20 mg by mouth daily.   Yes [provider]  oxyCODONE (OXYCONTIN) 60 MG 12 hr tablet Take one tablet by mouth every 12 hours for pain Patient taking differently: Take 60 mg by mouth every 12 (twelve) hours.  10/19/16  Yes Lauree Chandler, NP  oxyCODONE-acetaminophen (ROXICET) 5-325 MG tablet Take one tablet by mouth every 8 hours as needed for pain. DO NOT EXCEED 3GM OF APAP IN 24 HOURS FROM ALL SOURCES Patient taking differently: Take 1 tablet by mouth every 8 (eight) hours as needed for moderate pain or severe pain.  11/26/16  Yes Reed, Tiffany L, DO  PARoxetine (PAXIL) 40 MG tablet Take 40 mg by mouth every morning.   Yes [provider]  potassium chloride (K-DUR) 10 MEQ tablet Take 2 tablets (20 mEq total) by mouth daily. 12/02/15  Yes [name], MD  pyrithione zinc (SELSUN BLUE DRY SCALP) 1 % shampoo Apply 1 application topically 2 (two) times a week. APPLY TO  SCALP AND FACE ON TUES & FRI   Yes [provider]  senna-docusate (SENOKOT-S) 8.6-50 MG tablet Take 2 tablets by mouth at bedtime. 12/02/15  Yes [name], MD  Skin Protectants, Misc. (CALAZIME SKIN PROTECTANT) PSTE Apply 1 application topically See admin instructions. APPLY TO SACRAL AREA TWO TIMES A DAY AND AS NEEDED AFTER CLEANSING FROM SOILING/WETNESS TO PREVENT BREAKDOWN   Yes [provider]  sodium chloride (OCEAN) 0.65 % SOLN nasal spray Place 1 spray into both nostrils as needed for congestion. 12/02/15  Yes [name], MD  tamsulosin (FLOMAX) 0.4 MG CAPS capsule Take 0.4 mg by mouth daily.  11/26/13  Yes [provider]  traZODone (DESYREL) 50 MG tablet Take 50 mg by mouth at bedtime.   Yes [provider]  triamcinolone cream (KENALOG) 0.1 % Apply 1 application topically every 12 (twelve) hours as needed (for rash).   Yes [provider]  vitamin B-12 (CYANOCOBALAMIN) 1000 MCG tablet Take 1,000 mcg by mouth daily.   Yes [provider]  Vitamins A & D (VITAMIN A & D) ointment Apply 1 application topically 3  (three) times daily. APPLY TO LEFT HEEL   Yes [provider]  clindamycin (CLEOCIN) 150 MG capsule Take 3 capsules (450 mg total) by mouth 3 (three) times daily. Patient not taking: Reported on 03/25/2017 02/22/17   Quintella Reichert, MD  diazepam (VALIUM) 5 MG tablet Take 1 tablet (5 mg total) by mouth every 8 (eight) hours as needed for anxiety or muscle spasms. Patient not taking: Reported on 03/25/2017 12/02/15   [name], MD  famotidine (PEPCID) 20 MG tablet Take 1 tablet (20 mg total) by mouth 2 (two) times daily. Patient not taking: Reported on 03/25/2017 02/23/17   Jola Schmidt, MD  metFORMIN (GLUCOPHAGE) 500 MG tablet Take 1 tablet (500 mg total) by mouth 2 (two) times daily with a meal. Patient not taking: Reported on 03/25/2017 12/02/15   [name], MD  polyethylene glycol (MIRALAX / Floria Raveling) packet Take 17 g by mouth daily. Hold if diarrhea Patient not taking: Reported on 03/25/2017 12/02/15   [name], MD  pregabalin (LYRICA) 75 MG capsule Take 1 capsule (75 mg total) by mouth every 8 (eight) hours. Patient not taking: Reported on 03/25/2017 12/02/15   [name], MD  tizanidine (ZANAFLEX) 2 MG capsule Take 1 capsule (2 mg total) by mouth 3 (three) times daily as needed for muscle spasms. Patient not taking: Reported on 03/25/2017 12/02/15   [name], MD   Gregory Foot Right Wo [name]  Result Date: 03/26/2017 CLINICAL DATA:  Diabetic patient with chronic right foot ulcers. EXAM: MRI OF THE RIGHT FOREFOOT WITHOUT [name] TECHNIQUE: Multiplanar, multisequence Gregory imaging of the right forefoot Was performed. No intravenous [name] was administered. COMPARISON:  Plain films the right foot 03/25/2017 and 10/02/2016. FINDINGS: Bones/Joint/Cartilage Bony destructive change is seen in the lateral aspect of the calcaneus where a defect measuring approximately 2 cm AP by 0.8 cm transverse is identified. There is marrow edema in the underlying calcaneus extending 3.7 cm long by approximately 1.4 cm  transverse by 1.9 cm craniocaudal. Edema is moderate in intensity. Intense marrow edema and destructive change are seen in the head of the fifth metatarsal and proximal phalanx of the little toe. The distal 0.6 cm of the little toe is spared. The diaphysis of the fifth metatarsal demonstrates low level edema. Marrow edema and bony destructive change are identified about the PIP joint of the second toe consistent with  septic joint and surrounding osteomyelitis. The base of the proximal phalanx and head of the middle phalanx are spared. Ligaments Intact. Muscles and Tendons Atrophy of the intrinsic musculature the foot is noted. No intramuscular fluid collection. Soft tissues Skin ulceration at the level of fifth MTP joint is noted. No soft tissue abscess. IMPRESSION: Findings consistent with osteomyelitis in the head of the fifth metatarsal and almost entire proximal phalanx of the fifth toe. Low level edema in the diaphysis of the fifth metatarsal has an appearance most suggestive of reactive change. Findings consistent with septic PIP joint of the second toe with osteomyelitis in the proximal and distal phalanges as described above. Findings consistent with chronic osteomyelitis in the lateral aspect of the calcaneus where there is bony destructive change. Intermediate increased T2 signal in the calcaneus about the site of osteolysis is consistent with osteomyelitis. Electronically Signed   By: Inge Rise M.D.   On: 03/26/2017 13:03   Gregory [name]  Result Date: 03/26/2017 CLINICAL DATA:  Diabetic patient chronic skin ulcerations on both feet. EXAM: MRI OF THE LEFT TOES WITHOUT [name] TECHNIQUE: Multiplanar, multisequence Gregory imaging of the left toes was performed. No intravenous [name] was administered. COMPARISON:  Plain films left foot 10/02/2016. FINDINGS: Bones/Joint/Cartilage Severe valgus angulation and plantar flexion are seen at the IP joint of the great toe. There is marrow edema  throughout almost the entire distal phalanx of the great toe and in the distal 1.3 cm of the proximal phalanx. The appears to be a skin wound on the medial soft tissues of the great toe at the level of the IP joint. Increased T2 signal is seen throughout the distal phalanx of the second toe. Bone marrow signal is otherwise normal clawtoe deformities are noted. Ligaments Negative. Muscles and Tendons Atrophy of the intrinsic musculature the foot is noted. No intramuscular fluid collection. Soft tissues No soft tissue abscess is seen. IMPRESSION: Skin ulceration along the medial margin of the IP joint of the great toe with marrow edema in the proximal and distal phalanges most consistent osteomyelitis. Severe valgus angulation and flexion deformity about the IP joint of the great toe noted. Marrow edema in the distal phalanx of the second toe most consistent osteomyelitis. Negative for soft tissue abscess. Electronically Signed   By: Inge Rise M.D.   On: 03/26/2017 13:10   Dg Foot Complete Right  Result Date: 03/25/2017 CLINICAL DATA:  56 year old male with lower extremity wounds and pain. EXAM: RIGHT FOOT COMPLETE - 3+ VIEW COMPARISON:  Foot radiographs 10/02/2016 and earlier. FINDINGS: Interval improved and largely normalized bone mineralization at the posterior calcaneus where osteolysis was noted in April. However, there is new cortical osteolysis and destruction of the head of the right second proximal phalanx (arrows). There is involvement of the right PIP and likely also the base of the second middle phalanx. No other cortical osteolysis in the right foot. There is increased distal foot soft tissue swelling. No subcutaneous gas. IMPRESSION: 1. Positive for Osteomyelitis and septic joint of the right second PIP. Destruction of the head of the right second proximal phalanx since April. 2. Associated increased distal foot soft tissue swelling. No subcutaneous gas. 3. Improved appearance of the dorsal  calcaneus since April. Electronically Signed   By: Genevie Ann M.D.   On: 03/25/2017 15:29   - pertinent xrays, CT, MRI studies were reviewed and independently interpreted  Positive ROS: All other systems have been reviewed and were otherwise negative with the exception  of those mentioned in the HPI and as above.  Physical Exam: General: Alert, no acute distress Psychiatric: Patient is competent for consent with normal mood and affect Lymphatic: No axillary or cervical lymphadenopathy Cardiovascular: No pedal edema Respiratory: No cyanosis, no use of accessory musculature GI: No organomegaly, abdomen is soft and non-tender  Skin: examination patient has ulceration over the right calcaneus and right medial column he has equinus contracture of the foot he has good dorsalis pedis pulse.   Neurologic: Patient does not have protective sensation bilateral lower extremities.   MUSCULOSKELETAL:  Examination of the MRI scan shows osteomyelitis of the tuft of the left great toe as well as osteomyelitis of the right calcaneus.patient has good pulses bilaterally. He has a necrotic ulcer over the medial column the right foot as well as a right calcaneus and flexion contracture of the left foot and left great toe.  Assessment: Assessment: Right calcaneus osteomyelitis with osteomyelitis of the left great toewith diabetic insensate neuropathy  Plan: Plan: We will plan for right transtibial amputation and left great toe amputation on Friday.Patient states that his daughter would like to discuss the surgical options. We'll plan on meeting tomorrow Thursday at 5 PM at bedside  Thank you for the consult and the opportunity to see Gregory. Zeek [name], [place] [phone] 7:24 AM

## 2017-03-28 ENCOUNTER — Encounter (HOSPITAL_COMMUNITY): Payer: Self-pay | Admitting: General Practice

## 2017-03-28 ENCOUNTER — Other Ambulatory Visit (INDEPENDENT_AMBULATORY_CARE_PROVIDER_SITE_OTHER): Payer: Self-pay | Admitting: Family

## 2017-03-28 LAB — GLUCOSE, CAPILLARY
GLUCOSE-CAPILLARY: 147 mg/dL — AB (ref 65–99)
Glucose-Capillary: 212 mg/dL — ABNORMAL HIGH (ref 65–99)
Glucose-Capillary: 178 mg/dL — ABNORMAL HIGH (ref 65–99)
Glucose-Capillary: 181 mg/dL — ABNORMAL HIGH (ref 65–99)

## 2017-03-28 LAB — CBC WITH DIFFERENTIAL/PLATELET
Basophils Absolute: 0 10*3/uL (ref 0.0–0.1)
Basophils Relative: 1 %
EOS PCT: 10 %
Eosinophils Absolute: 0.6 10*3/uL (ref 0.0–0.7)
HCT: 32.7 % — ABNORMAL LOW (ref 39.0–52.0)
Hemoglobin: 9.8 g/dL — ABNORMAL LOW (ref 13.0–17.0)
LYMPHS ABS: 2.4 10*3/uL (ref 0.7–4.0)
LYMPHS PCT: 36 %
MCH: 26.6 pg (ref 26.0–34.0)
MCHC: 30 g/dL (ref 30.0–36.0)
MCV: 88.6 fL (ref 78.0–100.0)
MONO ABS: 0.6 10*3/uL (ref 0.1–1.0)
MONOS PCT: 9 %
Neutro Abs: 2.9 10*3/uL (ref 1.7–7.7)
Neutrophils Relative %: 44 %
PLATELETS: 321 10*3/uL (ref 150–400)
RBC: 3.69 MIL/uL — AB (ref 4.22–5.81)
RDW: 16.6 % — ABNORMAL HIGH (ref 11.5–15.5)
WBC: 6.6 10*3/uL (ref 4.0–10.5)

## 2017-03-28 LAB — COMPREHENSIVE METABOLIC PANEL
ALT: 25 U/L (ref 17–63)
AST: 28 U/L (ref 15–41)
Albumin: 3.2 g/dL — ABNORMAL LOW (ref 3.5–5.0)
Alkaline Phosphatase: 92 U/L (ref 38–126)
Anion gap: 12 (ref 5–15)
BUN: 17 mg/dL (ref 6–20)
CALCIUM: 9.3 mg/dL (ref 8.9–10.3)
CHLORIDE: 96 mmol/L — AB (ref 101–111)
CO2: 29 mmol/L (ref 22–32)
Creatinine, Ser: 1.79 mg/dL — ABNORMAL HIGH (ref 0.61–1.24)
GFR, EST AFRICAN AMERICAN: 47 mL/min — AB (ref >60)
GFR, EST NON AFRICAN AMERICAN: 40 mL/min — AB (ref >60)
Glucose, Bld: 162 mg/dL — ABNORMAL HIGH (ref 65–99)
Potassium: 4.1 mmol/L (ref 3.5–5.1)
Sodium: 137 mmol/L (ref 135–145)
Total Bilirubin: 0.4 mg/dL (ref 0.3–1.2)
Total Protein: 7.4 g/dL (ref 6.5–8.1)

## 2017-03-28 MED ORDER — CHLORHEXIDINE GLUCONATE 4 % EX LIQD
60.0000 mL | Freq: Once | CUTANEOUS | Status: AC
  Administered 2017-03-29: 4 via TOPICAL

## 2017-03-28 MED ORDER — CEFAZOLIN SODIUM-DEXTROSE 2-4 GM/100ML-% IV SOLN
2.0000 g | INTRAVENOUS | Status: DC
  Filled 2017-03-28: qty 100

## 2017-03-28 NOTE — Progress Notes (Signed)
PROGRESS NOTE    Gregory [name]  UMP:[id number] DOB: 1960/11/03 DOA: 03/25/2017 PCP: Patient, No Pcp Per   Specialists:     Brief Narrative:  70 wm Copd htn  Dm Chr LBP H/o  MRSA + OSteo Cervical/Lumbar spine   Recent stay at [facility] for protracted illness--was intubated 2/2 to either PNa or oversedation  Underwent c3-7 laminectomy, post intrumentation, Arthrodeses  Has been on Chr doxy  R foot showed Chr Osteo   Assessment & Plan:   Active Problems:   Foot drop, bilateral   Type II diabetes mellitus with neurological manifestations ([place])   Dyslipidemia associated with type 2 diabetes mellitus (HCC)   Depression, recurrent (HCC)   Osteomyelitis (HCC)   Skin cancer   Osteomyelitis R foot 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  MRI of the lower extremity shows osteomyelitis right foot-Dr [name] planning right transtibial amputation, left great  Toe amputation  Review of Percocet 1-2->oxycodoen 7.5   continue vancomycin + ceftriaxone----BC X2 no growth to date from 10/8   Chronic pain Lumbar degenerative disc disease with MRSA-status post operation [facility]  Continue OxyContin 60 every 12  May continue morphine 2-4 every 4 when necessary severe pain  Wellbutrin 450 daily--neuropathic component to the pain  Continue gabapentin 400 3 times a day  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 For now  Malignant melanoma above right eyebrow, diagnosed about 6 months ago  Follows with a dermatologist in [place] follow-up being arranged by daughter   Lovenox Full code No  family Inpatient pending resolution and surgery?   Consultants:   Dr. Doran Durand  Procedures:   None yet  Antimicrobials:   Vancomycin + ceftriaxone 10/8    Subjective:  Some discomfort today that better now A little anxious about surgery but has had a good explanation from the surgeon Overall no real changes today  Objective: Vitals:   03/27/17 1428 03/27/17 2054 03/28/17 0448 03/28/17 1525  BP: 111/74 127/71 114/71 120/70  Pulse: 68 66 77 66  Resp: 18 18 16 18   Temp: 97.8 F (36.6 C) 97.7 F (36.5 C) 97.8 F (36.6 C) 98.2 F (36.8 C)  TempSrc: Oral Oral Oral Oral  SpO2: 96% 97% 90% 96%  Weight:      Height:        Intake/Output Summary (Last 24 hours) at 03/28/17 1637 Last data filed at 03/28/17 1121  Gross per 24 hour  Intake              712 ml  Output             1250 ml  Net             -538 ml   Filed Weights   03/25/17 1409 03/25/17 1832  Weight: 99.8 kg (220 lb) 99.9 kg (220 lb 3.8 oz)    Examination:  External ocular movements intact NCATmelanoma noted over right eyebrow In nad-looks stable Eating well S1-S2 nsr to exam Chest clinically clear no added sound In socks and not examined   Data Reviewed: I have personally reviewed following labs and imaging studies  CBC:  Recent Labs Lab  03/25/17 1533 03/26/17 0254 03/28/17 0654  WBC 7.8 6.8 6.6  NEUTROABS 4.9 4.3 2.9  HGB 10.2* 9.5* 9.8*  HCT 33.1* 30.2* 32.7*  MCV 86.6 87.8 88.6  PLT 326 298 540   Basic Metabolic Panel:  Recent Labs Lab 03/25/17 1533 03/26/17 0254 03/28/17 0654  NA 135 138 137  K 4.4 3.4* 4.1  CL 94* 97* 96*  CO2 29 30 29   GLUCOSE 146* 215* 162*  BUN 20 18 17   CREATININE 1.93* 1.87* 1.79*  CALCIUM 9.2 8.9 9.3   GFR: Estimated Creatinine Clearance: 55.7 mL/min (A) (by C-G formula based on SCr of 1.79 mg/dL (H)). Liver Function Tests:  Recent Labs Lab 03/25/17 1533 03/28/17 0654  AST 30 28  ALT 19 25  ALKPHOS 90 92  BILITOT 0.7 0.4  PROT 7.7  7.4  ALBUMIN 3.2* 3.2*   No results for input(s): LIPASE, AMYLASE in the last 168 hours. No results for input(s): AMMONIA in the last 168 hours. Coagulation Profile: No results for input(s): INR, PROTIME in the last 168 hours. Cardiac Enzymes: No results for input(s): CKTOTAL, CKMB, CKMBINDEX, TROPONINI in the last 168 hours. BNP (last 3 results) No results for input(s): PROBNP in the last 8760 hours. HbA1C:  Recent Labs  03/25/17 1725  HGBA1C 7.2*   CBG:  Recent Labs Lab 03/27/17 1221 03/27/17 1717 03/27/17 2051 03/28/17 0806 03/28/17 1041  GLUCAP 217* 122* 166* 147* 212*   Lipid Profile: No results for input(s): CHOL, HDL, LDLCALC, TRIG, CHOLHDL, LDLDIRECT in the last 72 hours. Thyroid Function Tests: No results for input(s): TSH, T4TOTAL, FREET4, T3FREE, THYROIDAB in the last 72 hours. Anemia Panel: No results for input(s): VITAMINB12, FOLATE, FERRITIN, TIBC, IRON, RETICCTPCT in the last 72 hours. Urine analysis:    Component Value Date/Time   COLORURINE YELLOW 01/12/2017 [place] 01/12/2017 1629   LABSPEC 1.006 01/12/2017 1629   PHURINE 6.0 01/12/2017 1629   GLUCOSEU NEGATIVE 01/12/2017 1629   HGBUR MODERATE (A) 01/12/2017 1629   BILIRUBINUR NEGATIVE 01/12/2017 [place] 01/12/2017 1629   PROTEINUR NEGATIVE 01/12/2017 1629   UROBILINOGEN 1.0 02/03/2014 0052   NITRITE POSITIVE (A) 01/12/2017 1629   LEUKOCYTESUR MODERATE (A) 01/12/2017 1629     Radiology Studies: Reviewed images personally in health database    Scheduled Meds: . buPROPion  450 mg Oral Daily  . [START ON 03/29/2017] chlorhexidine  60 mL Topical Once  . Chlorhexidine Gluconate Cloth  6 each Topical Daily  . cholecalciferol  5,000 Units Oral Daily  . docusate sodium  100 mg Oral BID  . feeding supplement  1 Container Oral BID BM  . fluticasone  1 spray Each Nare BID  . furosemide  40 mg Oral BID  . gabapentin  400 mg Oral TID  . heparin  5,000 Units  Subcutaneous Q8H  . insulin aspart  0-5 Units Subcutaneous QHS  . insulin aspart  0-9 Units Subcutaneous TID WC  . magnesium oxide  400 mg Oral BID  . metoprolol tartrate  25 mg Oral Daily  . multivitamin with minerals  1 tablet Oral Daily  . mupirocin ointment  1 application Nasal BID  . oxyCODONE  60 mg Oral Q12H  . pantoprazole  40 mg Oral Daily  . PARoxetine  40 mg Oral [id number]  . potassium chloride  20 mEq Oral Daily  . tamsulosin  0.4 mg Oral Daily  . traZODone  50 mg Oral QHS  . vitamin A & D  1  application Topical TID  . vitamin B-12  1,000 mcg Oral Daily  . zinc oxide  1 application Topical See admin instructions   Continuous Infusions: . [START ON 03/29/2017]  ceFAZolin (ANCEF) IV    . cefTRIAXone (ROCEPHIN)  IV Stopped (03/27/17 1825)   And  . metronidazole Stopped (03/28/17 1130)  . vancomycin Stopped (03/27/17 2027)     LOS: 3 days    Time spent: [place], MD [name]   If 7PM-7AM, please contact night-coverage [email or web address] Password [facility] 03/28/2017, 4:37 PM

## 2017-03-28 NOTE — Progress Notes (Signed)
Pharmacy Antibiotic Note  Gregory [name] is a 56 y.o. male admitted on 03/25/2017 with DFI/osteo.  Pharmacy has been consulted for vancomycin dosing.  SCr still slighty elevated at 1.79, but overall stable and at baseline.  Patient to go for amputation tomorrow.  Plan: Vancomycin 1250mg  IV Q24h Flagyl 500mg  IV q8h per MD CTX 2g IV q24h per MD Monitor c/s, clinical picture, renal function, VT at Css, abx deescalation / LOT   Height: 6' (182.9 cm) Weight: 220 lb 3.8 oz (99.9 kg) IBW/kg (Calculated) : 77.6  Temp (24hrs), Avg:97.8 F (36.6 C), Min:97.7 F (36.5 C), Max:97.8 F (36.6 C)   Recent Labs Lab 03/25/17 1533 03/26/17 0254 03/28/17 0654  WBC 7.8 6.8 6.6  CREATININE 1.93* 1.87* 1.79*    Estimated Creatinine Clearance: 55.7 mL/min (A) (by C-G formula based on SCr of 1.79 mg/dL (H)).    No Known Allergies  Vanc 10/8>> Flagyl 10/8>> CTX 10/9>> (dose ordered but missed on 10/8)  Thank you for allowing pharmacy to be a part of this patient's care.  Nature Kueker 03/28/2017 10:36 AM

## 2017-03-29 ENCOUNTER — Encounter (HOSPITAL_COMMUNITY): Payer: Self-pay | Admitting: Surgery

## 2017-03-29 ENCOUNTER — Encounter (HOSPITAL_COMMUNITY)
Admission: EM | Disposition: A | Discharge status: Skilled Nursing Facility | Payer: Self-pay | Source: Home / Self Care | Attending: Family Medicine

## 2017-03-29 ENCOUNTER — Inpatient Hospital Stay (HOSPITAL_COMMUNITY): Payer: Medicaid Other | Admitting: Anesthesiology

## 2017-03-29 HISTORY — PX: AMPUTATION: SHX166

## 2017-03-29 LAB — GLUCOSE, CAPILLARY
GLUCOSE-CAPILLARY: 174 mg/dL — AB (ref 65–99)
Glucose-Capillary: 172 mg/dL — ABNORMAL HIGH (ref 65–99)
Glucose-Capillary: 159 mg/dL — ABNORMAL HIGH (ref 65–99)
Glucose-Capillary: 157 mg/dL — ABNORMAL HIGH (ref 65–99)

## 2017-03-29 SURGERY — AMPUTATION DIGIT
Anesthesia: General | Laterality: Right

## 2017-03-29 MED ORDER — NALOXONE HCL 0.4 MG/ML IJ SOLN: 0.4000 mg | INTRAMUSCULAR | Status: DC | PRN

## 2017-03-29 MED ORDER — METOCLOPRAMIDE HCL 5 MG PO TABS: 5.0000 mg | ORAL_TABLET | Freq: Three times a day (TID) | ORAL | Status: DC | PRN

## 2017-03-29 MED ORDER — ONDANSETRON HCL 4 MG/2ML IJ SOLN
4.0000 mg | Freq: Four times a day (QID) | INTRAMUSCULAR | Status: DC | PRN
  Filled 2017-03-29: qty 2

## 2017-03-29 MED ORDER — ONDANSETRON HCL 4 MG/2ML IJ SOLN
4.0000 mg | Freq: Four times a day (QID) | INTRAMUSCULAR | Status: DC | PRN
  Administered 2017-03-29: 4 mg via INTRAVENOUS

## 2017-03-29 MED ORDER — PHENYLEPHRINE HCL 10 MG/ML IJ SOLN
INTRAMUSCULAR | Status: DC | PRN
  Administered 2017-03-29: 120 ug via INTRAVENOUS
  Administered 2017-03-29 (×2): 80 ug via INTRAVENOUS

## 2017-03-29 MED ORDER — LIDOCAINE 2% (20 MG/ML) 5 ML SYRINGE
INTRAMUSCULAR | Status: AC
  Filled 2017-03-29: qty 5

## 2017-03-29 MED ORDER — FENTANYL CITRATE (PF) 250 MCG/5ML IJ SOLN
INTRAMUSCULAR | Status: AC
  Filled 2017-03-29: qty 5

## 2017-03-29 MED ORDER — MORPHINE SULFATE (PF) 4 MG/ML IV SOLN
1.0000 mg | INTRAVENOUS | Status: DC | PRN
  Administered 2017-03-29 (×6): 2 mg via INTRAVENOUS

## 2017-03-29 MED ORDER — SODIUM CHLORIDE 0.9 % IV SOLN
INTRAVENOUS | Status: DC
  Administered 2017-03-29: 19:00:00 via INTRAVENOUS

## 2017-03-29 MED ORDER — DIPHENHYDRAMINE HCL 12.5 MG/5ML PO ELIX: 12.5000 mg | ORAL_SOLUTION | Freq: Four times a day (QID) | ORAL | Status: DC | PRN

## 2017-03-29 MED ORDER — PROPOFOL 10 MG/ML IV BOLUS
INTRAVENOUS | Status: AC
  Filled 2017-03-29: qty 20

## 2017-03-29 MED ORDER — ONDANSETRON HCL 4 MG/2ML IJ SOLN
INTRAMUSCULAR | Status: AC
  Filled 2017-03-29: qty 2

## 2017-03-29 MED ORDER — ONDANSETRON HCL 4 MG/2ML IJ SOLN
INTRAMUSCULAR | Status: DC | PRN
  Administered 2017-03-29: 4 mg via INTRAVENOUS

## 2017-03-29 MED ORDER — DIPHENHYDRAMINE HCL 50 MG/ML IJ SOLN: 12.5000 mg | Freq: Four times a day (QID) | INTRAMUSCULAR | Status: DC | PRN

## 2017-03-29 MED ORDER — BISACODYL 10 MG RE SUPP: 10.0000 mg | Freq: Every day | RECTAL | Status: DC | PRN

## 2017-03-29 MED ORDER — ACETAMINOPHEN 650 MG RE SUPP: 650.0000 mg | Freq: Four times a day (QID) | RECTAL | Status: DC | PRN

## 2017-03-29 MED ORDER — MORPHINE SULFATE (PF) 4 MG/ML IV SOLN
4.0000 mg | Freq: Once | INTRAVENOUS | Status: AC
  Administered 2017-03-29: 4 mg via INTRAVENOUS

## 2017-03-29 MED ORDER — LIDOCAINE HCL (CARDIAC) 20 MG/ML IV SOLN
INTRAVENOUS | Status: DC | PRN
  Administered 2017-03-29: 60 mg via INTRATRACHEAL

## 2017-03-29 MED ORDER — OXYCODONE HCL 5 MG PO TABS
5.0000 mg | ORAL_TABLET | ORAL | Status: DC | PRN
  Administered 2017-03-29: 10 mg via ORAL
  Filled 2017-03-29: qty 2

## 2017-03-29 MED ORDER — MORPHINE SULFATE (PF) 4 MG/ML IV SOLN
INTRAVENOUS | Status: AC
  Filled 2017-03-29: qty 1

## 2017-03-29 MED ORDER — OXYCODONE HCL 5 MG PO TABS: 5.0000 mg | ORAL_TABLET | Freq: Once | ORAL | Status: DC | PRN

## 2017-03-29 MED ORDER — SODIUM CHLORIDE 0.9% FLUSH: 9.0000 mL | INTRAVENOUS | Status: DC | PRN

## 2017-03-29 MED ORDER — PROMETHAZINE HCL 25 MG/ML IJ SOLN: 6.2500 mg | INTRAMUSCULAR | Status: DC | PRN

## 2017-03-29 MED ORDER — HYDROMORPHONE HCL 1 MG/ML IJ SOLN: 1.0000 mg | INTRAMUSCULAR | Status: DC | PRN

## 2017-03-29 MED ORDER — MAGNESIUM CITRATE PO SOLN: 1.0000 | Freq: Once | ORAL | Status: DC | PRN

## 2017-03-29 MED ORDER — HYDROMORPHONE HCL 1 MG/ML IJ SOLN
INTRAMUSCULAR | Status: AC
  Filled 2017-03-29: qty 1

## 2017-03-29 MED ORDER — SENNOSIDES-DOCUSATE SODIUM 8.6-50 MG PO TABS
2.0000 | ORAL_TABLET | Freq: Every day | ORAL | Status: DC
  Administered 2017-03-29 – 2017-04-01 (×4): 2 via ORAL
  Filled 2017-03-29 (×4): qty 2

## 2017-03-29 MED ORDER — POLYETHYLENE GLYCOL 3350 17 G PO PACK
17.0000 g | PACK | Freq: Every day | ORAL | Status: DC | PRN
  Administered 2017-04-02: 17 g via ORAL
  Filled 2017-03-29: qty 1

## 2017-03-29 MED ORDER — ONDANSETRON HCL 4 MG/2ML IJ SOLN: 4.0000 mg | Freq: Four times a day (QID) | INTRAMUSCULAR | Status: DC | PRN

## 2017-03-29 MED ORDER — DOCUSATE SODIUM 100 MG PO CAPS
100.0000 mg | ORAL_CAPSULE | Freq: Two times a day (BID) | ORAL | Status: DC
  Administered 2017-03-29 – 2017-04-02 (×8): 100 mg via ORAL
  Filled 2017-03-29 (×8): qty 1

## 2017-03-29 MED ORDER — OXYCODONE HCL 5 MG/5ML PO SOLN: 5.0000 mg | Freq: Once | ORAL | Status: DC | PRN

## 2017-03-29 MED ORDER — ACETAMINOPHEN 325 MG PO TABS: 650.0000 mg | ORAL_TABLET | Freq: Four times a day (QID) | ORAL | Status: DC | PRN

## 2017-03-29 MED ORDER — HYDROMORPHONE HCL 1 MG/ML IJ SOLN
0.2500 mg | INTRAMUSCULAR | Status: DC | PRN
Start: 2017-03-29 — End: 2017-03-29
  Administered 2017-03-29 (×4): 0.5 mg via INTRAVENOUS

## 2017-03-29 MED ORDER — METHOCARBAMOL 500 MG PO TABS
500.0000 mg | ORAL_TABLET | Freq: Four times a day (QID) | ORAL | Status: DC | PRN
  Administered 2017-03-29 – 2017-04-02 (×12): 500 mg via ORAL
  Filled 2017-03-29 (×12): qty 1

## 2017-03-29 MED ORDER — SODIUM CHLORIDE 0.9 % IV SOLN
INTRAVENOUS | Status: DC | PRN
  Administered 2017-03-29 (×2): via INTRAVENOUS

## 2017-03-29 MED ORDER — MORPHINE SULFATE 2 MG/ML IV SOLN
INTRAVENOUS | Status: DC
  Filled 2017-03-29: qty 30

## 2017-03-29 MED ORDER — LACTATED RINGERS IV SOLN: INTRAVENOUS | Status: DC | PRN

## 2017-03-29 MED ORDER — GLUCERNA SHAKE PO LIQD
237.0000 mL | Freq: Two times a day (BID) | ORAL | Status: DC
  Administered 2017-03-31 – 2017-04-02 (×2): 237 mL via ORAL

## 2017-03-29 MED ORDER — GLYCOPYRROLATE 0.2 MG/ML IJ SOLN
INTRAMUSCULAR | Status: DC | PRN
  Administered 2017-03-29: 0.2 mg via INTRAVENOUS

## 2017-03-29 MED ORDER — ONDANSETRON HCL 4 MG PO TABS: 4.0000 mg | ORAL_TABLET | Freq: Four times a day (QID) | ORAL | Status: DC | PRN

## 2017-03-29 MED ORDER — CEFAZOLIN SODIUM-DEXTROSE 2-3 GM-% IV SOLR
INTRAVENOUS | Status: DC | PRN
  Administered 2017-03-29: 2 g via INTRAVENOUS

## 2017-03-29 MED ORDER — FENTANYL CITRATE (PF) 250 MCG/5ML IJ SOLN
INTRAMUSCULAR | Status: DC | PRN
  Administered 2017-03-29: 25 ug via INTRAVENOUS
  Administered 2017-03-29 (×2): 50 ug via INTRAVENOUS
  Administered 2017-03-29: 25 ug via INTRAVENOUS
  Administered 2017-03-29: 50 ug via INTRAVENOUS
  Administered 2017-03-29 (×2): 25 ug via INTRAVENOUS

## 2017-03-29 MED ORDER — METHOCARBAMOL 1000 MG/10ML IJ SOLN
500.0000 mg | Freq: Four times a day (QID) | INTRAMUSCULAR | Status: DC | PRN
  Filled 2017-03-29: qty 5

## 2017-03-29 MED ORDER — PROPOFOL 10 MG/ML IV BOLUS
INTRAVENOUS | Status: DC | PRN
  Administered 2017-03-29: 200 mg via INTRAVENOUS

## 2017-03-29 MED ORDER — 0.9 % SODIUM CHLORIDE (POUR BTL) OPTIME
TOPICAL | Status: DC | PRN
  Administered 2017-03-29: 1000 mL

## 2017-03-29 MED ORDER — MORPHINE SULFATE 2 MG/ML IV SOLN
INTRAVENOUS | Status: DC
  Filled 2017-03-29: qty 25

## 2017-03-29 MED ORDER — MORPHINE SULFATE 2 MG/ML IV SOLN
INTRAVENOUS | Status: DC
  Administered 2017-03-29 – 2017-03-30 (×2): via INTRAVENOUS
  Administered 2017-03-30: 21.52 mg via INTRAVENOUS
  Administered 2017-03-30: 14.62 mg via INTRAVENOUS
  Administered 2017-03-30: 01:00:00 via INTRAVENOUS
  Administered 2017-03-30: 9.33 mg via INTRAVENOUS
  Administered 2017-03-31: 11:00:00 via INTRAVENOUS
  Administered 2017-03-31: 29.46 mg via INTRAVENOUS
  Administered 2017-03-31: 27.19 mg via INTRAVENOUS
  Administered 2017-03-31: 05:00:00 via INTRAVENOUS
  Filled 2017-03-29 (×2): qty 25
  Filled 2017-03-29: qty 50
  Filled 2017-03-29 (×5): qty 25

## 2017-03-29 MED ORDER — METOCLOPRAMIDE HCL 5 MG/ML IJ SOLN: 5.0000 mg | Freq: Three times a day (TID) | INTRAMUSCULAR | Status: DC | PRN

## 2017-03-29 MED ORDER — CEFAZOLIN SODIUM-DEXTROSE 1-4 GM/50ML-% IV SOLN
1.0000 g | Freq: Four times a day (QID) | INTRAVENOUS | Status: DC
  Administered 2017-03-29: 1 g via INTRAVENOUS
  Filled 2017-03-29 (×3): qty 50

## 2017-03-29 SURGICAL SUPPLY — 45 items
BLADE SAW RECIP 87.9 MT (BLADE) ×3 IMPLANT
BLADE SURG 21 STRL SS (BLADE) ×3 IMPLANT
BNDG CMPR 9X4 STRL LF SNTH (GAUZE/BANDAGES/DRESSINGS) ×2
BNDG COHESIVE 4X5 TAN STRL (GAUZE/BANDAGES/DRESSINGS) ×3 IMPLANT
BNDG COHESIVE 6X5 TAN STRL LF (GAUZE/BANDAGES/DRESSINGS) ×6 IMPLANT
BNDG ESMARK 4X9 LF (GAUZE/BANDAGES/DRESSINGS) ×3 IMPLANT
BNDG GAUZE ELAST 4 BULKY (GAUZE/BANDAGES/DRESSINGS) ×3 IMPLANT
COVER SURGICAL LIGHT HANDLE (MISCELLANEOUS) ×3 IMPLANT
CUFF TOURNIQUET SINGLE 34IN LL (TOURNIQUET CUFF) IMPLANT
CUFF TOURNIQUET SINGLE 44IN (TOURNIQUET CUFF) IMPLANT
DRAPE INCISE IOBAN 66X45 STRL (DRAPES) IMPLANT
DRAPE U-SHAPE 47X51 STRL (DRAPES) ×3 IMPLANT
DRESSING PREVENA PLUS CUSTOM (GAUZE/BANDAGES/DRESSINGS) ×2 IMPLANT
DRSG ADAPTIC 3X8 NADH LF (GAUZE/BANDAGES/DRESSINGS) IMPLANT
DRSG EMULSION OIL 3X3 NADH (GAUZE/BANDAGES/DRESSINGS) ×3 IMPLANT
DRSG PAD ABDOMINAL 8X10 ST (GAUZE/BANDAGES/DRESSINGS) ×3 IMPLANT
DRSG PREVENA PLUS CUSTOM (GAUZE/BANDAGES/DRESSINGS) ×3
DRSG VAC ATS MED SENSATRAC (GAUZE/BANDAGES/DRESSINGS) ×3 IMPLANT
DURAPREP 26ML APPLICATOR (WOUND CARE) ×3 IMPLANT
ELECT REM PT RETURN 9FT ADLT (ELECTROSURGICAL) ×3
ELECTRODE REM PT RTRN 9FT ADLT (ELECTROSURGICAL) ×2 IMPLANT
GAUZE SPONGE 4X4 12PLY STRL (GAUZE/BANDAGES/DRESSINGS) IMPLANT
GAUZE SPONGE 4X4 12PLY STRL LF (GAUZE/BANDAGES/DRESSINGS) ×3 IMPLANT
GLOVE BIOGEL PI IND STRL 9 (GLOVE) ×2 IMPLANT
GLOVE BIOGEL PI INDICATOR 9 (GLOVE) ×1
GLOVE SURG ORTHO 9.0 STRL STRW (GLOVE) ×3 IMPLANT
GOWN STRL REUS W/ TWL XL LVL3 (GOWN DISPOSABLE) ×4 IMPLANT
GOWN STRL REUS W/TWL XL LVL3 (GOWN DISPOSABLE) ×6
KIT BASIN OR (CUSTOM PROCEDURE TRAY) ×3 IMPLANT
KIT ROOM TURNOVER OR (KITS) ×3 IMPLANT
MANIFOLD NEPTUNE II (INSTRUMENTS) ×3 IMPLANT
NEEDLE 22X1 1/2 (OR ONLY) (NEEDLE) IMPLANT
NS IRRIG 1000ML POUR BTL (IV SOLUTION) ×3 IMPLANT
PACK ORTHO EXTREMITY (CUSTOM PROCEDURE TRAY) ×3 IMPLANT
PAD ARMBOARD 7.5X6 YLW CONV (MISCELLANEOUS) ×6 IMPLANT
SPONGE LAP 18X18 X RAY DECT (DISPOSABLE) IMPLANT
STAPLER VISISTAT 35W (STAPLE) IMPLANT
STOCKINETTE IMPERVIOUS LG (DRAPES) ×3 IMPLANT
SUT ETHILON 2 0 PSLX (SUTURE) ×3 IMPLANT
SUT SILK 2 0 (SUTURE) ×2
SUT SILK 2-0 18XBRD TIE 12 (SUTURE) ×2 IMPLANT
SUT VIC AB 1 CTX 27 (SUTURE) IMPLANT
SYR CONTROL 10ML LL (SYRINGE) IMPLANT
TOWEL OR 17X26 10 PK STRL BLUE (TOWEL DISPOSABLE) ×3 IMPLANT
WND VAC CANISTER 500ML (MISCELLANEOUS) ×3 IMPLANT

## 2017-03-29 NOTE — Progress Notes (Signed)
Pt refused Hibiclens. I will wait for my daughter at 8am to give it to me.

## 2017-03-29 NOTE — Interval H&P Note (Signed)
History and Physical Interval Note:  03/29/2017 6:44 AM  [name]  has presented today for surgery, with the diagnosis of Osteomyelitis Right Calcaneus and Left Great Toe  The various methods of treatment have been discussed with the patient and family. After consideration of risks, benefits and other options for treatment, the patient has consented to  Procedure(s): LEFT GREAT TOE AMPUTATION AT METATARSOPHALANGEAL JOINT (Left) RIGHT BELOW KNEE AMPUTATION (Right) as a surgical intervention .  The patient's history has been reviewed, patient examined, no change in status, stable for surgery.  I have reviewed the patient's chart and labs.  Questions were answered to the patient's satisfaction.     Gregory [name]

## 2017-03-29 NOTE — Anesthesia Procedure Notes (Addendum)
Procedure Name: LMA Insertion Date/Time: 03/29/2017 10:10 AM Performed by: Murvin Natal Pre-anesthesia Checklist: Patient identified, Emergency Drugs available, Suction available and Patient being monitored Patient Re-evaluated:Patient Re-evaluated prior to induction Oxygen Delivery Method: Circle system utilized Preoxygenation: Pre-oxygenation with 100% oxygen Induction Type: IV induction Ventilation: Mask ventilation without difficulty LMA: LMA inserted LMA Size: 5.0 Number of attempts: 1 Tube secured with: Tape Dental Injury: Teeth and Oropharynx as per pre-operative assessment

## 2017-03-29 NOTE — Progress Notes (Signed)
Nutrition Follow-up  DOCUMENTATION CODES:   Not applicable  INTERVENTION:   -D/c Boost Breeze -Glucerna Shake po BID, each supplement provides 220 kcal and 10 grams of protein -Continue MVI daily  NUTRITION DIAGNOSIS:   Increased nutrient needs related to wound healing as evidenced by estimated needs.  Ongoing  GOAL:   Patient will meet greater than or equal to 90% of their needs  Progressing  MONITOR:   PO intake, Supplement acceptance, Labs, Weight trends, Skin, I & O's  REASON FOR ASSESSMENT:   Consult Wound healing  ASSESSMENT:   56 y/o male with complex PMH including diabetes and osteomyelitis of the spine complicated by abscess.   S/p PROCEDURE on 03/29/17: Transtibial amputation on the right Application of Prevena wound VAC Amputation left great toe through the MTP joint  Pt just returned from OR and was in severe pain.   Nutrition-Focused physical exam completed. Findings are no fat depletion, no muscle depletion, and mild edema.   Noted Boost Breeze supplements were ordered, however, pt has been refusing.  Medications reviewed and include lasix, magnesium oxide, potassium chloride, and vitamin B-12.  Labs reviewed: CBGS: [id number] (inpatient orders for glycemic control are 0-9 units insulin aspart TID and 0-5 units insulin aspart q HS).   Diet Order:  Diet Carb Modified Fluid consistency: Thin; Room service appropriate? Yes  Skin:  Wound (see comment) (rt BKA incision w wound vac)  Last BM:  03/28/17  Height:   Ht Readings from Last 1 Encounters:  03/25/17 6' (1.829 m)    Weight:   Wt Readings from Last 1 Encounters:  03/25/17 220 lb 3.8 oz (99.9 kg)    Ideal Body Weight:  75.6 kg  BMI:  Body mass index is 29.87 kg/m.  Estimated Nutritional Needs:   Kcal:  2050-2250  Protein:  120-135 grams  Fluid:  > 2.0 L  EDUCATION NEEDS:   Education needs no appropriate at this time  Nylan Nakatani A. Jimmye Norman, RD, LDN, CDE Pager:  [phone] After hours Pager: [phone]

## 2017-03-29 NOTE — Anesthesia Preprocedure Evaluation (Addendum)
Anesthesia Evaluation  Patient identified by MRN, date of birth, ID band Patient awake    Reviewed: Allergy & Precautions, H&P , NPO status , Patient's Chart, lab work & pertinent test results, reviewed documented beta blocker date and time   History of Anesthesia Complications Negative for: history of anesthetic complications  Airway Mallampati: II  TM Distance: >3 FB Neck ROM: Limited  Mouth opening: Limited Mouth Opening Comment: Limited by cervical collar Dental  (+) Missing, Poor Dentition   Pulmonary COPD, former smoker,    Pulmonary exam normal  (-) decreased breath sounds      Cardiovascular hypertension, Pt. on medications and Pt. on home beta blockers +CHF  Normal cardiovascular exam  ECG: SR, rate 83  ECHO: LV EF: 55% -   60%   Neuro/Psych PSYCHIATRIC DISORDERS Depression negative neurological ROS     GI/Hepatic Neg liver ROS, GERD  Medicated and Controlled,  Endo/Other  diabetes, Insulin Dependent, Oral Hypoglycemic Agents  Renal/GU Renal InsufficiencyRenal disease     Musculoskeletal  (+) Arthritis , Osteomyelitis  Chronic back pain Foot drop, right   Abdominal Normal abdominal exam  (+)   Peds  Hematology  (+) anemia ,   Anesthesia Other Findings Chronic pain HLD Epidural abscess  Reproductive/Obstetrics                            Anesthesia Physical  Anesthesia Plan  ASA: III  Anesthesia Plan: General   Post-op Pain Management:    Induction: Intravenous  PONV Risk Score and Plan: 2 and Ondansetron and Dexamethasone  Airway Management Planned: LMA  Additional Equipment:   Intra-op Plan:   Post-operative Plan: Extubation in OR  Informed Consent: I have reviewed the patients History and Physical, chart, labs and discussed the procedure including the risks, benefits and alternatives for the proposed anesthesia with the patient or authorized representative who  has indicated his/her understanding and acceptance.   Dental Advisory Given and Dental advisory given  Plan Discussed with: CRNA  Anesthesia Plan Comments:        Anesthesia Quick Evaluation

## 2017-03-29 NOTE — Op Note (Signed)
   Date of Surgery: 03/29/2017  INDICATIONS: Gregory [name] is a 56 y.o.-year-old male who presents with chronic osteomyelitis of the left great toe and right hindfoot.Marland Kitchen  PREOPERATIVE DIAGNOSIS: osteomyelitis left great toe and osteomyelitis right calcaneus  POSTOPERATIVE DIAGNOSIS: Same.  PROCEDURE: Transtibial amputation on the right Application of Prevena wound VAC Amputation left great toe through the MTP joint  SURGEON: [name], M.D.  ANESTHESIA:  general  IV FLUIDS AND URINE: See anesthesia.  ESTIMATED BLOOD LOSS: min mL.  COMPLICATIONS: None.  DESCRIPTION OF PROCEDURE: The patient was brought to the operating room and underwent a general anesthetic. After adequate levels of anesthesia were obtained patient's lower extremity bilaterally were prepped using DuraPrep draped into a sterile field. A timeout was called. The right foot was draped out of the sterile field with impervious stockinette. A transverse incision was made 11 cm distal to the tibial tubercle. This curved proximally and a large posterior flap was created. The tibia was transected 1 cm proximal to the skin incision. The fibula was transected just proximal to the tibial incision. The tibia was beveled anteriorly. A large posterior flap was created. The sciatic nerve was pulled cut and allowed to retract. The vascular bundles were suture ligated with 2-0 silk. The deep and superficial fascial layers were closed using #1 Vicryl. The skin was closed using staples and 2-0 nylon. The wound was covered with a Prevena wound VAC. There was a good suction fit.  Attention was then focused on the left lower extremity. A fishmouth incision was made just distal to the MTP joint. The toe was amputated through the MTP joint. Electrocautery was used for hemostasis. The wound was irrigated with normal saline. Incision was closed using 2-0 nylon. A sterile compressive was applied   Patient was extubated taken to the PACU in stable  condition.  Meridee Score, MD [place] 11:03 AM

## 2017-03-29 NOTE — H&P (View-Only) (Signed)
Patient ID: Gregory [name], male   DOB: [birth year], 58 y.o.   MRN: [id number] I discussed surgical versus nonsurgical options with the patient and family at bedside including his daughter. All questions were encouraged and answered. Patient and his family wish to proceed with a right transtibial amputation and left great toe amputation.  Patient has not walked in over a year. Discussed that his ability to walk with prosthesis will be limited by his strength. 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  Discussed that there is 10% mortality annually after the amputation.

## 2017-03-29 NOTE — Anesthesia Postprocedure Evaluation (Signed)
Anesthesia Post Note  Patient: Gregory [name]  Procedure(s) Performed: LEFT GREAT TOE AMPUTATION AT METATARSOPHALANGEAL JOINT (Left ) RIGHT BELOW KNEE AMPUTATION (Right )     Patient location during evaluation: PACU Anesthesia Type: General Level of consciousness: awake and alert Pain management: pain level controlled Vital Signs Assessment: post-procedure vital signs reviewed and stable Respiratory status: spontaneous breathing, nonlabored ventilation, respiratory function stable and patient connected to nasal cannula oxygen Cardiovascular status: blood pressure returned to baseline and stable Postop Assessment: no apparent nausea or vomiting Anesthetic complications: no    Last Vitals:  Vitals:   03/29/17 1234 03/29/17 1247  BP:  113/63  Pulse: 68 68  Resp: 14 18  Temp:  36.4 C  SpO2: 99% 99%    Last Pain:  Vitals:   03/29/17 1343  TempSrc:   PainSc: 10-Worst pain ever                 Ryan P Ellender

## 2017-03-29 NOTE — Progress Notes (Signed)
PROGRESS NOTE    Gregory [name]  HAL:[id number] DOB: 08-02-1960 DOA: 03/25/2017 PCP: Patient, No Pcp Per   Specialists:     Brief Narrative:   36 wm Copd htn  Dm Chr LBP H/o  MRSA + OSteo Cervical/Lumbar spine   Recent stay at [facility] for protracted illness--was intubated 2/2 to either PNa or oversedation  Underwent c3-7 laminectomy, post intrumentation, Arthrodeses  Has been on Chr doxy  R foot showed Chr Osteo   Assessment & Plan:   Active Problems:   Foot drop, bilateral   Type II diabetes mellitus with neurological manifestations ([place])   Dyslipidemia associated with type 2 diabetes mellitus (HCC)   Depression, recurrent (HCC)   Osteomyelitis (HCC)   Skin cancer   Osteomyelitis R foot 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  MRI of the lower extremity shows osteomyelitis right foot-Dr [name] planning right transtibial  amputation, left great  Toe amputation  continue vancomycin + ceftriaxone----BC X2 no growth to date from 10/8   Chronic pain Lumbar degenerative disc disease with MRSA-status post operation [facility]  Dilaudid apparently doesn't work for him--morphine 2-4 mg every 4 not hel[ing  D/w Pharmacist and worked out PCa custom dosing and will start the same  Will need adjustment in the next 24 hours re: pain control  D/c Oxycontin for now  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 For now  Malignant melanoma above right eyebrow, diagnosed about 6 months ago  Follows with a dermatologist in [facility] Outpatient follow-up being arranged by daughter   Lovenox Full code No  family Inpatient pending resolution and surgery?   Consultants:   Dr. Doran Durand  Procedures:   None yet  Antimicrobials:   Vancomycin + ceftriaxone 10/8    Subjective:  In severe pain crawling out of bed Asking for IV morphine Was able to get some rest with Robaxin as well as oxycodone but is in severe pain now   Objective: Vitals:   03/29/17 1224 03/29/17 1230 03/29/17 1234 03/29/17 1247  BP: 105/84   113/63  Pulse: 69 65 68 68  Resp: 16 16 14 18   Temp:  98 F (36.7 C)  97.6 F (36.4 C)  TempSrc:    Oral  SpO2: 99% 99% 99% 99%  Weight:      Height:        Intake/Output Summary (Last 24 hours) at 03/29/17 1541 Last data filed at 03/29/17 1230  Gross per 24 hour  Intake             1300 ml  Output             3300 ml  Net            -2000 ml   Filed Weights   03/25/17 1409 03/25/17 1832  Weight: 99.8 kg (220 lb) 99.9 kg (220 lb 3.8 oz)    Examination:  External ocular movements intact NCATmelanoma noted over right eyebrow In nad-looks stable Eating well S1-S2 nsr to exam Chest clinically clear no added sound R BKA noted-VAC ON STUMP   Data Reviewed: I have personally reviewed following labs and imaging studies  CBC:  Recent Labs Lab 03/25/17 1533 03/26/17 0254 03/28/17 0654  WBC 7.8 6.8 6.6  NEUTROABS 4.9 4.3 2.9  HGB 10.2* 9.5* 9.8*  HCT 33.1* 30.2* 32.7*  MCV 86.6 87.8 88.6  PLT 326 298 419   Basic Metabolic Panel:  Recent Labs Lab 03/25/17 1533 03/26/17 0254 03/28/17 0654  NA 135 138 137  K 4.4 3.4* 4.1  CL 94* 97* 96*  CO2 29 30 29   GLUCOSE 146* 215* 162*  BUN 20 18 17   CREATININE 1.93* 1.87* 1.79*  CALCIUM 9.2 8.9 9.3   GFR: Estimated Creatinine Clearance: 55.7 mL/min (A) (by C-G formula based on SCr of 1.79 mg/dL (H)). Liver Function Tests:  Recent Labs Lab 03/25/17 1533 03/28/17 0654  AST 30 28  ALT 19 25  ALKPHOS 90 92  BILITOT 0.7 0.4  PROT 7.7 7.4  ALBUMIN 3.2* 3.2*   No results for input(s): LIPASE,  AMYLASE in the last 168 hours. No results for input(s): AMMONIA in the last 168 hours. Coagulation Profile: No results for input(s): INR, PROTIME in the last 168 hours. Cardiac Enzymes: No results for input(s): CKTOTAL, CKMB, CKMBINDEX, TROPONINI in the last 168 hours. BNP (last 3 results) No results for input(s): PROBNP in the last 8760 hours. HbA1C: No results for input(s): HGBA1C in the last 72 hours. CBG:  Recent Labs Lab 03/28/17 1041 03/28/17 1719 03/28/17 2132 03/29/17 0751 03/29/17 1125  GLUCAP 212* 178* 181* 172* 159*   Lipid Profile: No results for input(s): CHOL, HDL, LDLCALC, TRIG, CHOLHDL, LDLDIRECT in the last 72 hours. Thyroid Function Tests: No results for input(s): TSH, T4TOTAL, FREET4, T3FREE, THYROIDAB in the last 72 hours. Anemia Panel: No results for input(s): VITAMINB12, FOLATE, FERRITIN, TIBC, IRON, RETICCTPCT in the last 72 hours. Urine analysis:    Component Value Date/Time   COLORURINE YELLOW 01/12/2017 [place] 01/12/2017 1629   LABSPEC 1.006 01/12/2017 1629   PHURINE 6.0 01/12/2017 1629   GLUCOSEU NEGATIVE 01/12/2017 1629   HGBUR MODERATE (A) 01/12/2017 1629   BILIRUBINUR NEGATIVE 01/12/2017 [place] 01/12/2017 1629   PROTEINUR NEGATIVE 01/12/2017 1629   UROBILINOGEN 1.0 02/03/2014 0052   NITRITE POSITIVE (A) 01/12/2017 1629   LEUKOCYTESUR MODERATE (A) 01/12/2017 1629     Radiology Studies: Reviewed images personally in health database    Scheduled Meds: . buPROPion  450 mg Oral Daily  . Chlorhexidine Gluconate Cloth  6 each Topical Daily  . cholecalciferol  5,000 Units Oral Daily  . docusate sodium  100 mg Oral BID  . feeding supplement  1 Container Oral BID BM  . fluticasone  1 spray Each Nare BID  . furosemide  40 mg Oral BID  . gabapentin  400 mg Oral TID  . heparin  5,000 Units Subcutaneous Q8H  . HYDROmorphone      . HYDROmorphone      . insulin aspart  0-5 Units Subcutaneous QHS  .  insulin aspart  0-9 Units Subcutaneous TID WC  . magnesium oxide  400 mg Oral BID  . metoprolol tartrate  25 mg Oral Daily  . morphine   Intravenous Q4H  . morphine      . morphine      . morphine      . multivitamin with minerals  1 tablet Oral Daily  . mupirocin ointment  1 application Nasal BID  . pantoprazole  40 mg Oral Daily  . PARoxetine  40 mg Oral BH-q7a  . potassium chloride  20 mEq Oral  Daily  . senna-docusate  2 tablet Oral QHS  . tamsulosin  0.4 mg Oral Daily  . traZODone  50 mg Oral QHS  . vitamin A & D  1 application Topical TID  . vitamin B-12  1,000 mcg Oral Daily  . zinc oxide  1 application Topical See admin instructions   Continuous Infusions: . sodium chloride    .  ceFAZolin (ANCEF) IV 1 g (03/29/17 1600)  . cefTRIAXone (ROCEPHIN)  IV Stopped (03/28/17 1906)   And  . metronidazole Stopped (03/29/17 0231)  . methocarbamol (ROBAXIN)  IV    . vancomycin Stopped (03/28/17 2100)     LOS: 4 days    Time spent: [place], MD [name]   If 7PM-7AM, please contact night-coverage [email or web address] Password [email or web address] 03/29/2017, 3:41 PM

## 2017-03-29 NOTE — Transfer of Care (Signed)
Immediate Anesthesia Transfer of Care Note  Patient: Gregory [name]  Procedure(s) Performed: LEFT GREAT TOE AMPUTATION AT METATARSOPHALANGEAL JOINT (Left ) RIGHT BELOW KNEE AMPUTATION (Right )  Patient Location: PACU  Anesthesia Type:General  Level of Consciousness: awake, alert  and patient cooperative  Airway & Oxygen Therapy: Patient Spontanous Breathing and Patient connected to face mask oxygen  Post-op Assessment: Report given to RN, Post -op Vital signs reviewed and stable, Patient moving all extremities X 4 and Patient able to stick tongue midline  Post vital signs: Reviewed and stable  Last Vitals:  Vitals:   03/29/17 0613 03/29/17 1110  BP: 118/78   Pulse: 66   Resp: 18   Temp: 36.8 C (!) (P) 36.4 C  SpO2: 95%     Last Pain:  Vitals:   03/29/17 0800  TempSrc:   PainSc: 10-Worst pain ever      Patients Stated Pain Goal: 0 (71/16/57 9038)  Complications: No apparent anesthesia complications

## 2017-03-29 NOTE — Progress Notes (Signed)
Excessive pain medication requests despite having already been heavily medicated for pain. Asked for MD to be paged for more pain medicine

## 2017-03-29 NOTE — Anesthesia Procedure Notes (Deleted)
Performed by: Murvin Natal

## 2017-03-30 ENCOUNTER — Encounter (HOSPITAL_COMMUNITY): Payer: Self-pay | Admitting: Orthopedic Surgery

## 2017-03-30 LAB — CULTURE, BLOOD (ROUTINE X 2)
CULTURE: NO GROWTH
Culture: NO GROWTH
SPECIAL REQUESTS: ADEQUATE
SPECIAL REQUESTS: ADEQUATE

## 2017-03-30 LAB — GLUCOSE, CAPILLARY
GLUCOSE-CAPILLARY: 185 mg/dL — AB (ref 65–99)
GLUCOSE-CAPILLARY: 150 mg/dL — AB (ref 65–99)
GLUCOSE-CAPILLARY: 165 mg/dL — AB (ref 65–99)
Glucose-Capillary: 162 mg/dL — ABNORMAL HIGH (ref 65–99)

## 2017-03-30 MED ORDER — PREGABALIN 50 MG PO CAPS
100.0000 mg | ORAL_CAPSULE | Freq: Two times a day (BID) | ORAL | Status: DC
  Administered 2017-03-30 – 2017-03-31 (×4): 100 mg via ORAL
  Filled 2017-03-30 (×4): qty 2

## 2017-03-30 NOTE — Progress Notes (Signed)
PT Cancellation Note  Patient Details Name: Gregory [name] MRN: [id number] DOB: [birth year]   Cancelled Treatment:    Reason Eval/Treat Not Completed: Pain limiting ability to participate. Pt long term resident of [facility]. Non ambulatory for several years. Per pt he was able to get to w/c on his own. Will try again at later date.   [place] 03/30/2017, 3:36 PM  [place]

## 2017-03-30 NOTE — Progress Notes (Signed)
Pt stable Vac working right Nurse daughter by bedside Pain predictably hard to control Morphine pca and pulse ox in place

## 2017-03-30 NOTE — Progress Notes (Signed)
PROGRESS NOTE    Gregory [name]  NIO:[id number] DOB: Sep 01, 1960 DOA: 03/25/2017 PCP: Patient, No Pcp Per   Specialists:     Brief Narrative:   18 wm Copd htn  Dm Chr LBP H/o  MRSA + OSteo Cervical/Lumbar spine   Recent stay at [facility] for protracted illness--was intubated 2/2 to either PNa or oversedation  Underwent c3-7 laminectomy, post intrumentation, Arthrodeses  Has been on Chr doxy  R foot showed Chr Osteo   Assessment & Plan:   Active Problems:   Foot drop, bilateral   Type II diabetes mellitus with neurological manifestations ([place])   Dyslipidemia associated with type 2 diabetes mellitus (HCC)   Depression, recurrent (HCC)   Osteomyelitis (HCC)   Skin cancer   Osteomyelitis R foot 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  MRI of the lower extremity shows osteomyelitis right foot-Dr [name] PERFORMED ON 10/12 right transtibial  amputation, left great  Toe amputation  continue vancomycin + ceftriaxone----BC X2 no growth to date from 10/8  D escalate antibiotics soon to maybe oral meds depending on margins and input from surgeon   Chronic pain Lumbar degenerative disc disease with MRSA-status post operation [facility]  D/c Oxycontin for now  urrently on morphine PCA custom dose-no adjustment today-somewhat sedated  Discussed neuropathic component of this pain-replace gabapentin 400 3 times a day with Lyrica 100 10/13  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 For now  Malignant melanoma above right eyebrow, diagnosed about 6 months ago  Follows with a dermatologist in [place] follow-up being arranged by  daughter   Lovenox Full code No family Inpatient pending resolution --return to skilled facility when stabilizing   Consultants:   Dr. Doran Durand  Procedures:   None yet  Antimicrobials:   Vancomycin + ceftriaxone 10/8    Subjective:  Pain still not completely controlled but is better than yesterday No nausea no vomiting tolerated 25% of breakfast No chest pain Asking about his meds and adjustments of the same   Objective: Vitals:   03/30/17 0359 03/30/17 0400 03/30/17 0547 03/30/17 0843  BP:   (!) 147/71   Pulse:   83   Resp: 18 18 20 16   Temp:   98.4 F (36.9 C)   TempSrc:   Oral   SpO2: 94% 94% 94% (!) 49%  Weight:      Height:        Intake/Output Summary (Last 24 hours) at 03/30/17 0939 Last data filed at 03/30/17 0547  Gross per 24 hour  Intake             1310 ml  Output             1625 ml  Net             -315 ml   Filed Weights   03/25/17 1409 03/25/17 1832  Weight: 99.8 kg (220 lb) 99.9 kg (220 lb 3.8 oz)    Examination:  External ocular movements intact NCATmelanoma noted over right eyebrow In nad-no new issue Eating fair S1-S2 nsr to exam Chest clinically clear no added sound R BKA noted-VAC ON STUMP Left lower extremity has Unna boot and  dressing around great toe which was not examined today   Data Reviewed: I have personally reviewed following labs and imaging studies  CBC:  Recent Labs Lab 03/25/17 1533 03/26/17 0254 03/28/17 0654  WBC 7.8 6.8 6.6  NEUTROABS 4.9 4.3 2.9  HGB 10.2* 9.5* 9.8*  HCT 33.1* 30.2* 32.7*  MCV 86.6 87.8 88.6  PLT 326 298 950   Basic Metabolic Panel:  Recent Labs Lab 03/25/17 1533 03/26/17 0254 03/28/17 0654  NA 135 138 137  K 4.4 3.4* 4.1  CL 94* 97* 96*  CO2 29 30 29   GLUCOSE 146* 215* 162*  BUN 20 18 17   CREATININE 1.93* 1.87* 1.79*  CALCIUM 9.2 8.9 9.3   GFR: Estimated Creatinine Clearance: 55.7 mL/min (A) (by C-G formula based on SCr of 1.79 mg/dL (H)). Liver Function  Tests:  Recent Labs Lab 03/25/17 1533 03/28/17 0654  AST 30 28  ALT 19 25  ALKPHOS 90 92  BILITOT 0.7 0.4  PROT 7.7 7.4  ALBUMIN 3.2* 3.2*   No results for input(s): LIPASE, AMYLASE in the last 168 hours. No results for input(s): AMMONIA in the last 168 hours. Coagulation Profile: No results for input(s): INR, PROTIME in the last 168 hours. Cardiac Enzymes: No results for input(s): CKTOTAL, CKMB, CKMBINDEX, TROPONINI in the last 168 hours. BNP (last 3 results) No results for input(s): PROBNP in the last 8760 hours. HbA1C: No results for input(s): HGBA1C in the last 72 hours. CBG:  Recent Labs Lab 03/29/17 0751 03/29/17 1125 03/29/17 1703 03/29/17 2201 03/30/17 0801  GLUCAP 172* 159* 157* 174* 185*   Lipid Profile: No results for input(s): CHOL, HDL, LDLCALC, TRIG, CHOLHDL, LDLDIRECT in the last 72 hours. Thyroid Function Tests: No results for input(s): TSH, T4TOTAL, FREET4, T3FREE, THYROIDAB in the last 72 hours. Anemia Panel: No results for input(s): VITAMINB12, FOLATE, FERRITIN, TIBC, IRON, RETICCTPCT in the last 72 hours. Urine analysis:    Component Value Date/Time   COLORURINE YELLOW 01/12/2017 [place] 01/12/2017 1629   LABSPEC 1.006 01/12/2017 1629   PHURINE 6.0 01/12/2017 1629   GLUCOSEU NEGATIVE 01/12/2017 1629   HGBUR MODERATE (A) 01/12/2017 1629   BILIRUBINUR NEGATIVE 01/12/2017 [place] 01/12/2017 1629   PROTEINUR NEGATIVE 01/12/2017 1629   UROBILINOGEN 1.0 02/03/2014 0052   NITRITE POSITIVE (A) 01/12/2017 1629   LEUKOCYTESUR MODERATE (A) 01/12/2017 1629     Radiology Studies: Reviewed images personally in health database    Scheduled Meds: . buPROPion  450 mg Oral Daily  . Chlorhexidine Gluconate Cloth  6 each Topical Daily  . cholecalciferol  5,000 Units Oral Daily  . docusate sodium  100 mg Oral BID  . feeding supplement (GLUCERNA SHAKE)  237 mL Oral BID BM  . fluticasone  1 spray Each Nare BID  .  furosemide  40 mg Oral BID  . gabapentin  400 mg Oral TID  . heparin  5,000 Units Subcutaneous Q8H  . insulin aspart  0-5 Units Subcutaneous QHS  . insulin aspart  0-9 Units Subcutaneous TID WC  . magnesium oxide  400 mg Oral BID  . metoprolol tartrate  25 mg Oral Daily  . morphine   Intravenous Q4H  . multivitamin with minerals  1 tablet Oral Daily  . mupirocin ointment  1 application Nasal BID  . pantoprazole  40 mg Oral Daily  . PARoxetine  40 mg Oral BH-q7a  . potassium chloride  20 mEq Oral Daily  . senna-docusate  2 tablet Oral  QHS  . tamsulosin  0.4 mg Oral Daily  . traZODone  50 mg Oral QHS  . vitamin A & D  1 application Topical TID  . vitamin B-12  1,000 mcg Oral Daily  . zinc oxide  1 application Topical See admin instructions   Continuous Infusions: . sodium chloride 10 mL/hr at 03/29/17 1843  . cefTRIAXone (ROCEPHIN)  IV Stopped (03/29/17 1911)   And  . metronidazole Stopped (03/30/17 0247)  . methocarbamol (ROBAXIN)  IV    . vancomycin Stopped (03/29/17 2240)     LOS: 5 days    Time spent: [place], MD [name]   If 7PM-7AM, please contact night-coverage [email or web address] Password [email or web address] 03/30/2017, 9:39 AM

## 2017-03-31 LAB — BASIC METABOLIC PANEL
ANION GAP: 12 (ref 5–15)
BUN: 18 mg/dL (ref 6–20)
CHLORIDE: 97 mmol/L — AB (ref 101–111)
CO2: 26 mmol/L (ref 22–32)
Calcium: 8.9 mg/dL (ref 8.9–10.3)
Creatinine, Ser: 2.06 mg/dL — ABNORMAL HIGH (ref 0.61–1.24)
GFR calc non Af Amer: 34 mL/min — ABNORMAL LOW (ref >60)
GFR, EST AFRICAN AMERICAN: 39 mL/min — AB (ref >60)
Glucose, Bld: 161 mg/dL — ABNORMAL HIGH (ref 65–99)
Potassium: 4.2 mmol/L (ref 3.5–5.1)
Sodium: 135 mmol/L (ref 135–145)

## 2017-03-31 LAB — GLUCOSE, CAPILLARY
GLUCOSE-CAPILLARY: 145 mg/dL — AB (ref 65–99)
GLUCOSE-CAPILLARY: 141 mg/dL — AB (ref 65–99)
Glucose-Capillary: 153 mg/dL — ABNORMAL HIGH (ref 65–99)
Glucose-Capillary: 175 mg/dL — ABNORMAL HIGH (ref 65–99)

## 2017-03-31 MED ORDER — OXYCODONE HCL 5 MG PO TABS
15.0000 mg | ORAL_TABLET | Freq: Four times a day (QID) | ORAL | Status: DC | PRN
  Administered 2017-03-31 – 2017-04-02 (×7): 15 mg via ORAL
  Filled 2017-03-31 (×7): qty 3

## 2017-03-31 MED ORDER — OXYCODONE HCL 5 MG PO TABS: 15.0000 mg | ORAL_TABLET | ORAL | Status: DC | PRN

## 2017-03-31 MED ORDER — OXYCODONE HCL ER 10 MG PO T12A
60.0000 mg | EXTENDED_RELEASE_TABLET | Freq: Two times a day (BID) | ORAL | Status: DC
  Administered 2017-03-31 – 2017-04-02 (×5): 60 mg via ORAL
  Filled 2017-03-31 (×5): qty 6

## 2017-03-31 NOTE — Evaluation (Signed)
Physical Therapy Evaluation Patient Details Name: Gregory [name] MRN: [id number] DOB: March 17, 1961 Today's Date: 03/31/2017   History of Present Illness  Pt is a 56 yo male admitted for R foot pain. Recent diagnosis of  melanoma above R eye.  Pt underwent R BKA, L great toe amputation. Pt resides at [facility]. PSH: anterior cervical corpectomy 11/2015, Posterior cervical fusion/foraminotomy 11/2015. PMH: COPD, HLD, osteomyelitis, bilat foot drop.  Clinical Impression  Pt appreciated education on phantom limb pain and was able to perform lateral scoot transfer to drop arm chair with verbal cues and set up and increased time. Pt remains to L drop foot. No WBing orders in chart for L LE, maintaining NWB due to pain. Acute PT to con't to follow.    Follow Up Recommendations SNF;Supervision/Assistance - 24 hour    Equipment Recommendations  None recommended by PT    Recommendations for Other Services       Precautions / Restrictions Precautions Precautions: Back Precaution Booklet Issued: No Precaution Comments: pt with good recall, pt also now with wound dehiscense Required Braces or Orthoses:  (order in chart stating no need for brace) Restrictions Weight Bearing Restrictions: Yes RLE Weight Bearing: Non weight bearing LLE Weight Bearing:  (no orders in chart, maintaining NWB due to pt wishes)      Mobility  Bed Mobility Overal bed mobility: Needs Assistance Bed Mobility: Supine to Sit     Supine to sit: Min assist     General bed mobility comments: pt able to bring LEs off EOB, pt inititiated raising trunk but required minA to complete sitting and modA to scoot to EOB  Transfers Overall transfer level: Needs assistance Equipment used: None (lateral scoot) Transfers: Lateral/Scoot Transfers          Lateral/Scoot Transfers: Min assist General transfer comment: minA for chair placement, v/c's for sequencing. pt reports "can't put weight on my L foot" there are no WBing  orders for L LE, pt NWB currently due ot pain. pt able to scoot over to chair with increased time and directional v/c's  Ambulation/Gait             General Gait Details: unable  Stairs            Wheelchair Mobility    Modified Rankin (Stroke Patients Only)       Balance Overall balance assessment: Needs assistance Sitting-balance support: Feet supported;No upper extremity supported Sitting balance-Leahy Scale: Fair     Standing balance support:  (unable)                                 Pertinent Vitals/Pain Pain Assessment: 0-10 Pain Score: 9  Pain Location: R residual limb Pain Descriptors / Indicators:  (phantom limb pain) Pain Intervention(s): Monitored during session (educated on desensitization)    Home Living Family/patient expects to be discharged to:: Skilled nursing facility                 Additional Comments: pt resides at [facility]    Prior Function Level of Independence: Needs assistance   Gait / Transfers Assistance Needed: per patient " i haven't walked in over a year", pt was indep with bed to w/c transfers  ADL's / Homemaking Assistance Needed: pt reports assist for bathing but could dress self        Hand Dominance   Dominant Hand: Right    Extremity/Trunk Assessment   Upper Extremity  Assessment Upper Extremity Assessment: Overall WFL for tasks assessed    Lower Extremity Assessment Lower Extremity Assessment: RLE deficits/detail;LLE deficits/detail RLE Deficits / Details: now AKA, able to complete quad set and active hip flex LLE Deficits / Details: 2/5 dorsiflexion, hip and knee ROM WFL, grossly 3/5 other than dorsiflexion    Cervical / Trunk Assessment Cervical / Trunk Assessment: Normal  Communication   Communication: HOH  Cognition Arousal/Alertness: Awake/alert Behavior During Therapy: WFL for tasks assessed/performed Overall Cognitive Status: Within Functional Limits for tasks assessed                                  General Comments: can be tangential with speech      General Comments General comments (skin integrity, edema, etc.): pt with open wound above R eye from melanoma excision    Exercises General Exercises - Lower Extremity Quad Sets: AROM;Both;5 reps;Supine Straight Leg Raises: AROM;Both;10 reps;Supine   Assessment/Plan    PT Assessment Patient needs continued PT services  PT Problem List Decreased strength;Decreased activity tolerance;Decreased balance;Decreased mobility;Decreased knowledge of use of DME;Pain       PT Treatment Interventions DME instruction;Functional mobility training;Therapeutic activities;Therapeutic exercise;Neuromuscular re-education    PT Goals (Current goals can be found in the Care Plan section)  Acute Rehab PT Goals Patient Stated Goal: walk PT Goal Formulation: With patient Time For Goal Achievement: 04/14/17 Potential to Achieve Goals: Good Additional Goals Additional Goal #1: Pt to complete desensitizing techniques to R residual limb indep.    Frequency Min 3X/week   Barriers to discharge        Co-evaluation               AM-PAC PT "6 Clicks" Daily Activity  Outcome Measure Difficulty turning over in bed (including adjusting bedclothes, sheets and blankets)?: A Lot Difficulty moving from lying on back to sitting on the side of the bed? : A Lot Difficulty sitting down on and standing up from a chair with arms (e.g., wheelchair, bedside commode, etc,.)?: Unable Help needed moving to and from a bed to chair (including a wheelchair)?: A Little Help needed walking in hospital room?: Total Help needed climbing 3-5 steps with a railing? : Total 6 Click Score: 10    End of Session Equipment Utilized During Treatment:  (wound vac to R limb) Activity Tolerance: Patient tolerated treatment well Patient left: in chair;with call bell/phone within reach Nurse Communication: Mobility status (condom cath  came off) PT Visit Diagnosis: Muscle weakness (generalized) (M62.81);Difficulty in walking, not elsewhere classified (R26.2);Pain Pain - Right/Left: Right Pain - part of body:  (residual limb)    Time: 2706-2376 PT Time Calculation (min) (ACUTE ONLY): 27 min   Charges:   PT Evaluation $PT Eval Moderate Complexity: 1 Mod PT Treatments $Therapeutic Activity: 8-22 mins   PT G Codes:        Kittie Plater, PT, DPT Pager #: [phone] Office #: [phone]   [place] 03/31/2017, 12:13 PM

## 2017-03-31 NOTE — Progress Notes (Signed)
Placed gauze behind ears to protect ears from nasal cannula.

## 2017-03-31 NOTE — Progress Notes (Signed)
PROGRESS NOTE    Gregory [name]  EQA:[id number] DOB: 1960-08-20 DOA: 03/25/2017 PCP: Patient, No Pcp Per   Specialists:     Brief Narrative:   76 wm Copd htn  Dm Chr LBP H/o  MRSA + OSteo Cervical/Lumbar spine   Recent stay at [facility] for protracted illness--was intubated 2/2 to either PNa or oversedation  Underwent c3-7 laminectomy, post intrumentation, Arthrodeses  Has been on Chr doxy  R foot showed Chr Osteo   Assessment & Plan:   Active Problems:   Foot drop, bilateral   Type II diabetes mellitus with neurological manifestations ([place])   Dyslipidemia associated with type 2 diabetes mellitus (HCC)   Depression, recurrent (HCC)   Osteomyelitis (HCC)   Skin cancer   Osteomyelitis R foot 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 For now  Malignant melanoma above right eyebrow, diagnosed about 6 months ago  Follows with a dermatologist in [facility] Outpatient follow-up being arranged by daughter   Lovenox Full code No family Inpatient pending resolution --return to skilled facility when stabilizing   Consultants:   Dr. Doran Durand  Procedures:   Transtibial amputation on the right + Prevena wound VAC  Amputation left great toe MTP joint  Antimicrobials:   Vancomycin + ceftriaxone 10/8    Subjective:  Awake alert not in pain Tolerating diet Sitting in chair and trying to do exercises by PT   Objective: Vitals:   03/31/17 0503 03/31/17 0754 03/31/17 0756 03/31/17 1057  BP:      Pulse:      Resp: 10 12 12 14   Temp:      TempSrc:      SpO2: 98% 97% 97% 97%  Weight:      Height:        Intake/Output Summary (Last 24 hours) at 03/31/17 1122 Last data filed at 03/31/17 1000  Gross per 24 hour  Intake           922.83 ml  Output             2150 ml  Net         -1227.17 ml   Filed Weights   03/25/17 1409 03/25/17 1832  Weight: 99.8 kg (220 lb) 99.9 kg (220 lb 3.8 oz)    Examination:  Comfortable appearing External ocular movements intact NCAT melanoma noted over right eyebrow S1-S2 nsr to exam Chest clinically clear no added sound R BKA noted-VAC ON STUMP Left lower extremity has Unna boot and dressing around great toe which was  not examined today   Data Reviewed: I have personally reviewed following labs and imaging studies  CBC:  Recent Labs Lab 03/25/17 1533 03/26/17 0254 03/28/17 0654  WBC 7.8 6.8 6.6  NEUTROABS 4.9 4.3 2.9  HGB 10.2* 9.5* 9.8*  HCT 33.1* 30.2* 32.7*  MCV 86.6 87.8 88.6  PLT 326 298 299   Basic Metabolic Panel:  Recent Labs Lab 03/25/17 1533 03/26/17 0254 03/28/17 0654 03/31/17 0555  NA 135 138 137 135  K 4.4 3.4* 4.1 4.2  CL 94* 97* 96* 97*  CO2 29 30 29 26   GLUCOSE 146* 215* 162* 161*  BUN 20 18 17 18   CREATININE 1.93* 1.87* 1.79* 2.06*  CALCIUM 9.2 8.9 9.3 8.9   GFR: Estimated Creatinine Clearance: 48.4 mL/min (A) (by C-G formula based on SCr  of 2.06 mg/dL (H)). Liver Function Tests:  Recent Labs Lab 03/25/17 1533 03/28/17 0654  AST 30 28  ALT 19 25  ALKPHOS 90 92  BILITOT 0.7 0.4  PROT 7.7 7.4  ALBUMIN 3.2* 3.2*   No results for input(s): LIPASE, AMYLASE in the last 168 hours. No results for input(s): AMMONIA in the last 168 hours. Coagulation Profile: No results for input(s): INR, PROTIME in the last 168 hours. Cardiac Enzymes: No results for input(s): CKTOTAL, CKMB, CKMBINDEX, TROPONINI in the last 168 hours. BNP (last 3 results) No results for input(s): PROBNP in the last 8760 hours. HbA1C: No results for input(s): HGBA1C in the last 72 hours. CBG:  Recent Labs Lab 03/30/17 0801 03/30/17 1210 03/30/17 1731 03/30/17 2026 03/31/17 0848  GLUCAP 185* 150* 165* 162* 153*   Lipid Profile: No results for input(s): CHOL, HDL, LDLCALC, TRIG, CHOLHDL, LDLDIRECT in the last 72 hours. Thyroid Function Tests: No results for input(s): TSH, T4TOTAL, FREET4, T3FREE, THYROIDAB in the last 72 hours. Anemia Panel: No results for input(s): VITAMINB12, FOLATE, FERRITIN, TIBC, IRON, RETICCTPCT in the last 72 hours. Urine analysis:    Component Value Date/Time   COLORURINE YELLOW 01/12/2017 [place] 01/12/2017 1629   LABSPEC 1.006 01/12/2017 1629   PHURINE 6.0 01/12/2017 1629   GLUCOSEU NEGATIVE 01/12/2017 1629   HGBUR MODERATE (A) 01/12/2017 1629   BILIRUBINUR NEGATIVE 01/12/2017 [place] 01/12/2017 1629   PROTEINUR NEGATIVE 01/12/2017 1629   UROBILINOGEN 1.0 02/03/2014 0052   NITRITE POSITIVE (A) 01/12/2017 1629   LEUKOCYTESUR MODERATE (A) 01/12/2017 1629     Radiology Studies: Reviewed images personally in health database    Scheduled Meds: . buPROPion  450 mg Oral Daily  . cholecalciferol  5,000 Units Oral Daily  . docusate sodium  100 mg Oral BID  . feeding supplement (GLUCERNA SHAKE)  237 mL Oral BID BM  . fluticasone  1 spray Each Nare BID  . furosemide  40 mg Oral  BID  . heparin  5,000 Units Subcutaneous Q8H  . insulin aspart  0-5 Units Subcutaneous QHS  . insulin aspart  0-9 Units Subcutaneous TID WC  . magnesium oxide  400 mg Oral BID  . metoprolol tartrate  25 mg Oral Daily  . multivitamin with minerals  1 tablet Oral Daily  . oxyCODONE  60 mg Oral Q12H  . pantoprazole  40 mg Oral Daily  . PARoxetine  40 mg Oral BH-q7a  . potassium chloride  20 mEq Oral Daily  . pregabalin  100 mg Oral BID  . senna-docusate  2 tablet Oral QHS  . tamsulosin  0.4 mg Oral Daily  . traZODone  50  mg Oral QHS  . vitamin A & D  1 application Topical TID  . vitamin B-12  1,000 mcg Oral Daily  . zinc oxide  1 application Topical See admin instructions   Continuous Infusions: . sodium chloride 10 mL/hr at 03/29/17 1843  . cefTRIAXone (ROCEPHIN)  IV Stopped (03/30/17 1700)   And  . metronidazole Stopped (03/31/17 1000)  . methocarbamol (ROBAXIN)  IV    . vancomycin Stopped (03/30/17 2142)     LOS: 6 days    Time spent: [place], MD [name]   If 7PM-7AM, please contact night-coverage [email or web address] Password [email or web address] 03/31/2017, 11:22 AM

## 2017-03-31 NOTE — Progress Notes (Signed)
Pharmacy Antibiotic Note  Gregory [name] is a 56 y.o. male admitted on 03/25/2017 with DFI/osteo.  Pharmacy has been consulted for vancomycin dosing.  Continues on IV abx for R foot osteomyelitis. Now s/p transtibial amputation on 10/12. Had most recently been on Augmentin PTA (per ortho note) vs Zyvox (per med hx) for chronic R calcaneous osteo. Extensive history with infections. SCr up a little to 2.06.  Plan: Continue vancomycin 1250mg  IV Q24h Continue Flagyl 500mg  IV q8h per MD Continue CTX 2g IV q24h Monitor c/s, renal function, VT prn F/U abx deescalation / LOT  Consider stopping IV abx today and possibly transitioning to PO if not clean margins   Height: 6' (182.9 cm) Weight: 220 lb 3.8 oz (99.9 kg) IBW/kg (Calculated) : 77.6  Temp (24hrs), Avg:98.3 F (36.8 C), Min:98 F (36.7 C), Max:98.4 F (36.9 C)   Recent Labs Lab 03/25/17 1533 03/26/17 0254 03/28/17 0654 03/31/17 0555  WBC 7.8 6.8 6.6  --   CREATININE 1.93* 1.87* 1.79* 2.06*    Estimated Creatinine Clearance: 48.4 mL/min (A) (by C-G formula based on SCr of 2.06 mg/dL (H)).    No Known Allergies  Thank you for allowing pharmacy to be a part of this patient's care.  Reginia Naas 03/31/2017 2:14 PM

## 2017-04-01 LAB — GLUCOSE, CAPILLARY
GLUCOSE-CAPILLARY: 181 mg/dL — AB (ref 65–99)
Glucose-Capillary: 137 mg/dL — ABNORMAL HIGH (ref 65–99)
Glucose-Capillary: 185 mg/dL — ABNORMAL HIGH (ref 65–99)
Glucose-Capillary: 148 mg/dL — ABNORMAL HIGH (ref 65–99)

## 2017-04-01 MED ORDER — PREGABALIN 75 MG PO CAPS
300.0000 mg | ORAL_CAPSULE | Freq: Two times a day (BID) | ORAL | Status: DC
  Administered 2017-04-01 – 2017-04-02 (×2): 300 mg via ORAL
  Filled 2017-04-01 (×2): qty 4

## 2017-04-01 MED ORDER — CEPASTAT 14.5 MG MT LOZG
1.0000 | LOZENGE | OROMUCOSAL | Status: DC | PRN
  Filled 2017-04-01: qty 9

## 2017-04-01 MED ORDER — PREGABALIN 75 MG PO CAPS
200.0000 mg | ORAL_CAPSULE | Freq: Two times a day (BID) | ORAL | Status: DC
  Administered 2017-04-01: 200 mg via ORAL
  Filled 2017-04-01 (×2): qty 1

## 2017-04-01 NOTE — Progress Notes (Signed)
Patient ID: Gregory [name], male   DOB: [birth year], 56 y.o.   MRN: [id number] Patient is postoperative day 3 right transtibial amputation and left great toe amputation. Examination the wound VAC is clean and dry.Patient may be weightbearing as tolerated on the left heel for transfer training no gait training.

## 2017-04-01 NOTE — Progress Notes (Signed)
PROGRESS NOTE    Gregory [name]  XLK:[id number] DOB: 1961-04-28 DOA: 03/25/2017 PCP: Patient, No Pcp Per   Specialists:     Brief Narrative:   66 wm Copd htn  Dm Chr LBPhe is low normal blood in the urine and the yet but we will is as he may be sohe is raising her slightly in he is a delightful 56 year old H/o  MRSA + OSteo Cervical/Lumbar spine   Recent stay at [facility] for protracted illness--was intubated 2/2 to either PNa or oversedation  Underwent c3-7 laminectomy, post intrumentation, Arthrodeses  Has been on Chr doxy  R foot showed Chr Osteo   Assessment & Plan:   Active Problems:   Foot drop, bilateral   Type II diabetes mellitus with neurological manifestations ([place])   Dyslipidemia associated with type 2 diabetes mellitus (HCC)   Depression, recurrent (HCC)   Osteomyelitis (HCC)   Skin cancer   Osteomyelitis R foot 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 For now  Malignant melanoma above right eyebrow, diagnosed about 6 months ago  Follows with a dermatologist in [facility] Outpatient follow-up being arranged by daughter   Lovenox Full code No family Inpatient pending resolution --return to skilled facility when stabilizing   Consultants:   Dr. Doran Durand  Procedures:   Transtibial amputation on the right + Prevena wound VAC  Amputation left great toe MTP joint  Antimicrobials:   Vancomycin + ceftriaxone 10/8    Subjective:  Shooting pains in the right foot stump Eating and drinking well having ice cream No nausea no vomiting No fever Mild cough  Objective: Vitals:   03/31/17 1245 03/31/17 1517 03/31/17 2054 04/01/17 0507  BP:  104/89 111/74 108/69  Pulse:  66 71 69  Resp: 14 16 16 16   Temp:  98.6 F (37 C) 98.4 F (36.9 C) 98.5 F (36.9 C)  TempSrc:  Oral Oral Oral  SpO2: 97% 96% 92% 93%  Weight:      Height:        Intake/Output Summary (Last 24 hours) at 04/01/17 1029 Last data filed at 04/01/17 0934  Gross per 24 hour  Intake             1420 ml  Output             2050 ml  Net             -630 ml   Filed Weights   03/25/17 1409 03/25/17 1832  Weight: 99.8 kg (220 lb)  99.9 kg (220 lb 3.8 oz)    Examination:  Comfortable appearingin no distress slightly somnolent External ocular movements intact NCAT melanoma noted over right eyebrow S1-S2 nsr to exam Chest clinically clear no added sound no rales no rhonchi R BKA noted-VAC ON STUMP Left lower extremity has Unna boot and dressing   Data Reviewed: I have personally reviewed following labs and imaging studies  CBC:  Recent Labs Lab 03/25/17 1533 03/26/17 0254 03/28/17 0654  WBC 7.8 6.8 6.6  NEUTROABS 4.9 4.3 2.9  HGB 10.2* 9.5* 9.8*  HCT 33.1* 30.2* 32.7*  MCV 86.6 87.8 88.6  PLT 326 298 993   Basic Metabolic Panel:  Recent Labs Lab 03/25/17 1533 03/26/17 0254 03/28/17 0654  03/31/17 0555  NA 135 138 137 135  K 4.4 3.4* 4.1 4.2  CL 94* 97* 96* 97*  CO2 29 30 29 26   GLUCOSE 146* 215* 162* 161*  BUN 20 18 17 18   CREATININE 1.93* 1.87* 1.79* 2.06*  CALCIUM 9.2 8.9 9.3 8.9   GFR: Estimated Creatinine Clearance: 48.4 mL/min (A) (by C-G formula based on SCr of 2.06 mg/dL (H)). Liver Function Tests:  Recent Labs Lab 03/25/17 1533 03/28/17 0654  AST 30 28  ALT 19 25  ALKPHOS 90 92  BILITOT 0.7 0.4  PROT 7.7 7.4  ALBUMIN 3.2* 3.2*   No results for input(s): LIPASE, AMYLASE in the last 168 hours. No results for input(s): AMMONIA in the last 168 hours. Coagulation Profile: No results for input(s): INR, PROTIME in the last 168 hours. Cardiac Enzymes: No results for input(s): CKTOTAL, CKMB, CKMBINDEX, TROPONINI in the last 168 hours. BNP (last 3 results) No results for input(s): PROBNP in the last 8760 hours. HbA1C: No results for input(s): HGBA1C in the last 72 hours. CBG:  Recent Labs Lab 03/31/17 0848 03/31/17 1241 03/31/17 1654 03/31/17 2053 04/01/17 0810  GLUCAP 153* 145* 141* 175* 137*   Lipid Profile: No results for input(s): CHOL, HDL, LDLCALC, TRIG, CHOLHDL, LDLDIRECT in the last 72 hours. Thyroid Function Tests: No results for input(s): TSH, T4TOTAL, FREET4, T3FREE, THYROIDAB in the last 72 hours. Anemia Panel: No results for input(s): VITAMINB12, FOLATE, FERRITIN, TIBC, IRON, RETICCTPCT in the last 72 hours. Urine analysis:    Component Value Date/Time   COLORURINE YELLOW 01/12/2017 [place] 01/12/2017 1629   LABSPEC 1.006 01/12/2017 1629   PHURINE 6.0 01/12/2017 1629   GLUCOSEU NEGATIVE 01/12/2017 1629   HGBUR MODERATE (A) 01/12/2017 1629   BILIRUBINUR NEGATIVE 01/12/2017 [place] 01/12/2017 1629   PROTEINUR NEGATIVE 01/12/2017 1629   UROBILINOGEN 1.0 02/03/2014 0052   NITRITE POSITIVE (A) 01/12/2017 1629   LEUKOCYTESUR MODERATE (A) 01/12/2017 1629     Radiology Studies: Reviewed  images personally in health database    Scheduled Meds: . buPROPion  450 mg Oral Daily  . cholecalciferol  5,000 Units Oral Daily  . docusate sodium  100 mg Oral BID  . feeding supplement (GLUCERNA SHAKE)  237 mL Oral BID BM  . fluticasone  1 spray Each Nare BID  . furosemide  40 mg Oral BID  . heparin  5,000 Units Subcutaneous Q8H  . insulin aspart  0-5 Units Subcutaneous QHS  . insulin aspart  0-9 Units Subcutaneous TID WC  . magnesium oxide  400 mg Oral BID  . metoprolol tartrate  25 mg Oral Daily  . multivitamin with minerals  1 tablet Oral Daily  . oxyCODONE  60 mg Oral Q12H  . pantoprazole  40 mg Oral Daily  . PARoxetine  40 mg Oral BH-q7a  . potassium chloride  20 mEq Oral Daily  . pregabalin  100 mg Oral BID  . senna-docusate  2 tablet Oral QHS  . tamsulosin  0.4 mg Oral Daily  . traZODone  50 mg Oral QHS  . vitamin A & D  1 application Topical TID  . vitamin B-12  1,000 mcg Oral Daily  . zinc oxide  1 application Topical See admin instructions   Continuous Infusions: . sodium chloride 10 mL/hr at 03/29/17 1843  . methocarbamol (ROBAXIN)  IV       LOS: 7 days    Time spent: [place], MD [name]   If 7PM-7AM, please contact night-coverage [email or web address] Password [email or web address] 04/01/2017, 10:29 AM

## 2017-04-01 NOTE — Progress Notes (Signed)
Pt refusing nutrition supplement, Glucerna.

## 2017-04-02 LAB — GLUCOSE, CAPILLARY
Glucose-Capillary: 132 mg/dL — ABNORMAL HIGH (ref 65–99)
Glucose-Capillary: 211 mg/dL — ABNORMAL HIGH (ref 65–99)

## 2017-04-02 MED ORDER — OXYCODONE HCL 15 MG PO TABS: 15.0000 mg | ORAL_TABLET | Freq: Four times a day (QID) | ORAL | 0 refills | Status: DC | PRN

## 2017-04-02 MED ORDER — CYCLOBENZAPRINE HCL 10 MG PO TABS: 10.0000 mg | ORAL_TABLET | Freq: Three times a day (TID) | ORAL | 0 refills | Status: DC | PRN

## 2017-04-02 MED ORDER — OXYCODONE HCL ER 60 MG PO T12A: 60.0000 mg | EXTENDED_RELEASE_TABLET | Freq: Two times a day (BID) | ORAL | 0 refills | Status: DC

## 2017-04-02 MED ORDER — GLUCERNA SHAKE PO LIQD
237.0000 mL | Freq: Two times a day (BID) | ORAL | 0 refills | Status: DC
Start: 2017-04-02 — End: 2018-06-03

## 2017-04-02 MED ORDER — SENNOSIDES-DOCUSATE SODIUM 8.6-50 MG PO TABS: 2.0000 | ORAL_TABLET | Freq: Once | ORAL | 0 refills | Status: AC

## 2017-04-02 MED ORDER — SENNOSIDES-DOCUSATE SODIUM 8.6-50 MG PO TABS
2.0000 | ORAL_TABLET | Freq: Once | ORAL | Status: AC
  Administered 2017-04-02: 2 via ORAL
  Filled 2017-04-02: qty 2

## 2017-04-02 MED ORDER — TRAZODONE HCL 50 MG PO TABS: 50.0000 mg | ORAL_TABLET | Freq: Every day | ORAL | 0 refills | Status: DC

## 2017-04-02 MED ORDER — PREGABALIN 300 MG PO CAPS: 300.0000 mg | ORAL_CAPSULE | Freq: Two times a day (BID) | ORAL | 0 refills | Status: DC

## 2017-04-02 MED ORDER — BUPROPION HCL ER (XL) 450 MG PO TB24: 450.0000 mg | ORAL_TABLET | Freq: Every day | ORAL | 0 refills | Status: DC

## 2017-04-02 MED ORDER — PAROXETINE HCL 40 MG PO TABS: 40.0000 mg | ORAL_TABLET | ORAL | 0 refills | Status: DC

## 2017-04-02 NOTE — Progress Notes (Signed)
Pt for discharge report given to [facility] and given robaxin prior to discharge.

## 2017-04-02 NOTE — Progress Notes (Signed)
Pt for discharge going to [facility], waiting for social worker to set up for the transportation.

## 2017-04-02 NOTE — Progress Notes (Signed)
PT Cancellation Note  Patient Details Name: Gregory [name] MRN: [id number] DOB: December 09, 1960   Cancelled Treatment:    Reason Eval/Treat Not Completed: Patient declined, no reason specified (pt reports 10/10 pain, feeling miserable and not agreeable to therapy even if pain meds given. Pt aware of need to mobilize and states he will have plenty of therapy at SNF and he is not willing to participate today. Will attempt at later date)   Sandy Salaam Jashay Roddy 04/02/2017, 8:36 AM  Elwyn Reach, [place]

## 2017-04-02 NOTE — Social Work (Signed)
Clinical Social Worker facilitated patient discharge including contacting patient family and facility to confirm patient discharge plans.  Clinical information faxed to facility and family agreeable with plan.    CSW arranged ambulance transport via [facility] to [facility] .    RN to call [phone] to give report prior to discharge.  Clinical Social Worker will sign off for now as social work intervention is no longer needed. Please consult [place] again if new need arises.  Elissa Hefty, LCSW Clinical Social Worker [phone]

## 2017-04-02 NOTE — Progress Notes (Signed)
Pt for discharge going to [facility] report given to [employer], left great toe and right bka dressing changed, given pain meds, prescriptions and discharge summary included, dressing dry and intact, POA aware, discontinue peripheral IV line.

## 2017-04-02 NOTE — [place] FL2 (Signed)
[place] MEDICAID FL2 LEVEL OF CARE SCREENING TOOL     IDENTIFICATION  Patient Name: Gregory [name] Birthdate: 05/20/1961 Sex: male Admission Date (Current Location): 03/25/2017  [facility] and [place] Number:  Herbalist and Address:  [facility] [place], [place] [address], [place], [place] [address]      Provider Number: [id number]  Attending Physician Name and Address:  Nita Sells, MD  Relative Name and Phone Number:   [name], [phone]    Current Level of Care: Hospital Recommended Level of Care: [place] Prior Approval Number:    Date Approved/Denied: 03/26/17 PASRR Number: [id number]  Discharge Plan: SNF    Current Diagnoses: Patient Active Problem List   Diagnosis Date Noted  . Osteomyelitis ([place]) 03/25/2017  . Skin cancer 03/25/2017  . Depression, recurrent ([place]) 11/05/2016  . Acute osteomyelitis of calcaneum, right ([place]) 10/02/2016  . Diabetic ulcer of toe of left foot ([place]) 10/02/2016  . 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 Chronic pain 09/27/2016  . Dyslipidemia associated with type 2 diabetes mellitus ([place]) 06/06/2016  . GERD without esophagitis 05/11/2016  . Hypertensive heart disease with congestive heart failure ([place]) 12/15/2015  . Spinal stenosis in cervical region 12/15/2015  . Type II diabetes mellitus with neurological manifestations ([place]) 12/15/2015  . Chronic diastolic CHF (congestive heart failure) ([place])   . Pulmonary hypertension ([place])   . Urinary retention   . Foot drop, bilateral   . Pressure ulcer 11/10/2015  . MRSA bacteremia 10/03/2015  . COPD (chronic obstructive pulmonary disease) ([place]) 10/03/2015    Orientation RESPIRATION BLADDER Height & Weight     Self, Time, Situation, Place  O2 (Nasal Cannula 3L) External catheter Weight: 220 lb 3.8 oz (99.9 kg) Height:  6' (182.9 cm)  BEHAVIORAL SYMPTOMS/MOOD NEUROLOGICAL BOWEL NUTRITION STATUS      Continent Diet (See DC Summary)   AMBULATORY STATUS COMMUNICATION OF NEEDS Skin   Limited Assist Verbally Surgical wounds, Wound Vac (Right toe, ankle, heel, foot ulcer)                       Personal Care Assistance Level of Assistance  Dressing, Bathing, Feeding Bathing Assistance: Limited assistance Feeding assistance: Limited assistance Dressing Assistance: Limited assistance     Functional Limitations Info             SPECIAL CARE FACTORS FREQUENCY  PT (By licensed PT)     PT Frequency: 3x week              Contractures Contractures Info: Not present    Additional Factors Info  Code Status, Allergies, Insulin Sliding Scale, Isolation Precautions, Psychotropic Code Status Info: Full code Allergies Info: No Known Allergies Psychotropic Info: Wellbutrim Insulin Sliding Scale Info: Insulin Daily Isolation Precautions Info: MRSA     Current Medications (04/02/2017):  This is the current hospital active medication list Current Facility-Administered Medications  Medication Dose Route Frequency Provider Last Rate Last Dose  . 0.9 %  sodium chloride infusion   Intravenous Continuous [name], MD 10 mL/hr at 03/29/17 1843    . acetaminophen (TYLENOL) tablet 650 mg  650 mg Oral Q6H PRN [name], MD       Or  . acetaminophen (TYLENOL) suppository 650 mg  650 mg Rectal Q6H PRN [name], MD      . albuterol (PROVENTIL) (2.5 MG/3ML) 0.083% nebulizer solution 3 mL  3 mL Inhalation Q6H PRN Eulogio Bear U, DO      .  bisacodyl (DULCOLAX) suppository 10 mg  10 mg Rectal Daily PRN [name], MD      . buPROPion (WELLBUTRIN XL) 24 hr tablet 450 mg  450 mg Oral Daily Eulogio Bear U, DO   450 mg at 04/01/17 1105  . calcium carbonate (TUMS - dosed in mg elemental calcium) chewable tablet 400 mg of elemental calcium  2 tablet Oral Q8H PRN Eliseo Squires, Jessica U, DO   400 mg of elemental calcium at 04/01/17 0858  . cholecalciferol (VITAMIN D) tablet 5,000 Units  5,000 Units Oral Daily Geradine Girt, DO   5,000 Units at 04/02/17 0955  . cyclobenzaprine (FLEXERIL) tablet 10 mg  10 mg Oral Q8H PRN Eulogio Bear U, DO   10 mg at 04/02/17 0839  . diphenhydrAMINE (BENADRYL) capsule 25 mg  25 mg Oral Q6H PRN Vann, Jessica U, DO      . feeding supplement (GLUCERNA SHAKE) (GLUCERNA SHAKE) liquid 237 mL  237 mL Oral BID BM Nita Sells, MD   237 mL at 03/31/17 0909  . fluticasone (FLONASE) 50 MCG/ACT nasal spray 1 spray  1 spray Each Nare BID Eulogio Bear U, DO   1 spray at 04/02/17 0956  . furosemide (LASIX) tablet 40 mg  40 mg Oral BID Eulogio Bear U, DO   40 mg at 04/02/17 0839  . heparin injection 5,000 Units  5,000 Units Subcutaneous Q8H Vann, Jessica U, DO   5,000 Units at 04/02/17 0541  . hydrocortisone cream 1 % 1 application  1 application Topical [id number] PRN Eulogio Bear U, DO      . hydrOXYzine (ATARAX/VISTARIL) tablet 25 mg  25 mg Oral Q6H PRN Vann, Jessica U, DO      . insulin aspart (novoLOG) injection 0-5 Units  0-5 Units Subcutaneous QHS Vianne Bulls, MD   3 Units at 03/26/17 2218  . insulin aspart (novoLOG) injection 0-9 Units  0-9 Units Subcutaneous TID WC Opyd, Ilene Qua, MD   1 Units at 04/02/17 [phone]  . magnesium citrate solution 1 Bottle  1 Bottle Oral Once PRN [name], MD      . magnesium oxide (MAG-OX) tablet 400 mg  400 mg Oral BID Eulogio Bear U, DO   400 mg at 04/02/17 0956  . methocarbamol (ROBAXIN) tablet 500 mg  500 mg Oral Q6H PRN [name], MD   500 mg at 04/02/17 5409   Or  . methocarbamol (ROBAXIN) 500 mg in dextrose 5 % 50 mL IVPB  500 mg Intravenous Q6H PRN [name], MD      . metoprolol tartrate (LOPRESSOR) tablet 25 mg  25 mg Oral Daily Eulogio Bear U, DO   25 mg at 04/02/17 0955  . multivitamin with minerals tablet 1 tablet  1 tablet Oral Daily Nita Sells, MD   1 tablet at 04/02/17 0955  . ondansetron (ZOFRAN) tablet 4 mg  4 mg Oral Q6H PRN [name], MD       Or  . ondansetron [facility]) injection 4 mg  4 mg  Intravenous Q6H PRN [name], MD   4 mg at 03/29/17 2012  . oxyCODONE (Oxy IR/ROXICODONE) immediate release tablet 15 mg  15 mg Oral Q6H PRN Nita Sells, MD   15 mg at 04/02/17 8119  . oxyCODONE (OXYCONTIN) 12 hr tablet 60 mg  60 mg Oral Q12H Nita Sells, MD   60 mg at 04/02/17 0953  . pantoprazole (PROTONIX) EC tablet 40 mg  40 mg  Oral Daily Eulogio Bear U, DO   40 mg at 04/02/17 4503  . PARoxetine (PAXIL) tablet 40 mg  40 mg Oral BH-q7a Vann, Jessica U, DO   40 mg at 04/02/17 8882  . phenol-menthol (CEPASTAT) lozenge 1 lozenge  1 lozenge Buccal PRN Nita Sells, MD      . polyethylene glycol (MIRALAX / GLYCOLAX) packet 17 g  17 g Oral Daily PRN [name], MD      . potassium chloride (K-DUR,KLOR-CON) CR tablet 20 mEq  20 mEq Oral Daily Eulogio Bear U, DO   20 mEq at 04/02/17 0955  . pregabalin (LYRICA) capsule 300 mg  300 mg Oral BID Nita Sells, MD   300 mg at 04/02/17 0954  . senna-docusate (Senokot-S) tablet 2 tablet  2 tablet Oral Once Nita Sells, MD      . tamsulosin (FLOMAX) capsule 0.4 mg  0.4 mg Oral Daily Eulogio Bear U, DO   0.4 mg at 04/02/17 0955  . traZODone (DESYREL) tablet 50 mg  50 mg Oral QHS Eulogio Bear U, DO   50 mg at 04/01/17 2117  . triamcinolone cream (KENALOG) 0.1 % 1 application  1 application Topical C00L PRN Eulogio Bear U, DO      . vitamin A & D ointment 1 application  1 application Topical TID Eulogio Bear U, DO   1 application at 49/17/91 2248  . vitamin B-12 (CYANOCOBALAMIN) tablet 1,000 mcg  1,000 mcg Oral Daily Eulogio Bear U, DO   1,000 mcg at 04/02/17 0955  . zinc oxide 20 % ointment 1 application  1 application Topical See admin instructions Geradine Girt, DO   1 application at 50/56/97 9480     Discharge Medications: Please see discharge summary for a list of discharge medications.  Relevant Imaging Results:  Relevant Lab Results:   Additional Information SS#:[id number] [place], LCSW

## 2017-04-02 NOTE — Discharge Summary (Signed)
Physician Discharge Summary  Gregory [name] HYW:[id number] DOB: 02/14/1961 DOA: 03/25/2017  PCP: Patient, No Pcp Per  Admit date: 03/25/2017 Discharge date: 04/02/2017  Time spent: 25 minutes  Recommendations for Outpatient Follow-up:  1. Needs outpatient follow-up for malignant melanoma above the right eyebrow-this is being arranged by his daughter who is a Marine scientist and was called our office 2. Discharge without wound VAC to right BKA left foot dressing to be changed as per orthopedic instructions--outpatient follow-up with [name] to be arranged at skilled facility--may need transport to his office 3. Have given limited prescriptions of oxycodone 15 every 6, OxyContin 60 twice a day and all of his psychotropic meds--would be very careful in this patient regarding escalation of pain meds and or neuropathic medications given risk of somnolence and metabolic encephalopathy 4. Needsa basic metabolic panel in addition to CBC in about one week 5. Antibiotics were discontinued after amputation and careful monitoring of wound on left lower extremity as per orthopedic recommendations needed as an outpatient  Discharge Diagnoses:  Active Problems:   Foot drop, bilateral   Type II diabetes mellitus with neurological manifestations ([place])   Dyslipidemia associated with type 2 diabetes mellitus ([facility])   Depression, recurrent ([place])   Osteomyelitis ([place])   Skin cancer   Discharge Condition: improved  Diet recommendation: diabetic heart healthy  Filed Weights   03/25/17 1409 03/25/17 1832  Weight: 99.8 kg (220 lb) 99.9 kg (220 lb 3.8 oz)    History of present illness:   56 wm Copd htn  Dm Chr LBP H/o  MRSA + OSteo Cervical/Lumbar spine              Recent stay at [facility] for prot[facility] illness--was intubated 2/2 to either PNa or oversedation             Underwent c3-7 laminectomy, post intrumentation, Arthrodeses  Has been on Chr doxy  R foot showed [facility] Hospital Course:   Osteomyelitis R foot 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 For now  Malignant melanoma above right eyebrow, diagnosed about 6 months ago             Follows with a dermatologist in [place] follow-up being arranged by daughter  Procedures:  Transtibial amputation on the right with wound VAC, amputation left great toe  10/12 (  Consultations:  orthopedics  Discharge Exam: Vitals:   04/01/17 2130 04/02/17 0625  BP: 127/74 123/82  Pulse: 70 66  Resp: 16 16  Temp: 98.2 F (36.8 C) 98 F (36.7 C)  SpO2: 97% 94%    General: alert feels fair a little nauseous and may be a lightheaded--has however finished breakfast and is doing okay Cardiovascular: s1-S2 no murmur Respiratory: clinically clear Wound VAC in place Left lower extremity not examined  Discharge Instructions   Discharge Instructions    Diet - low sodium heart healthy    Complete by:  As directed    Increase activity slowly    Complete by:  As directed      Current Discharge Medication List    START taking these medications   Details  feeding supplement, GLUCERNA SHAKE, (GLUCERNA SHAKE) LIQD Take 237 mLs by mouth 2 (two) times daily between meals. Refills: 0    oxyCODONE (ROXICODONE) 15 MG immediate release tablet Take 1 tablet (15 mg total) by mouth every 6 (six) hours as needed for breakthrough pain. Qty: 4 tablet, Refills: 0      CONTINUE these medications which have CHANGED   Details  buPROPion 450 MG TB24 Take 450 mg by mouth daily. Qty: 3 tablet, Refills: 0    cyclobenzaprine (FLEXERIL) 10 MG tablet Take 1 tablet (10 mg total) by mouth every 8 (eight) hours as needed for muscle spasms. Qty: 3 tablet, Refills: 0    oxyCODONE (OXYCONTIN) 60 MG 12 hr tablet Take 60 mg by mouth every 12 (twelve) hours. Qty: 3 tablet, Refills: 0    PARoxetine (PAXIL) 40 MG tablet Take 1 tablet (40 mg total) by mouth every morning. Qty: 3 tablet, Refills: 0    pregabalin (LYRICA) 300 MG capsule Take 1 capsule (300 mg total) by mouth 2 (two) times daily. Qty: 4 capsule, Refills: 0    traZODone (DESYREL) 50 MG tablet Take 1 tablet (50 mg total) by mouth at bedtime. Qty: 3 tablet, Refills: 0      CONTINUE these medications which have NOT CHANGED   Details  albuterol (PROVENTIL HFA;VENTOLIN HFA) 108 (90 Base) MCG/ACT inhaler  Inhale 2 puffs into the lungs every 6 (six) hours as needed for shortness of breath.     bisacodyl (DULCOLAX) 10 MG suppository Place 1 suppository (10 mg total) rectally daily as needed for moderate constipation. Qty: 12 suppository, Refills: 0    calcium carbonate (TUMS - DOSED IN MG ELEMENTAL CALCIUM) 500 MG chewable tablet Chew 2 tablets by mouth every 8 (eight) hours as needed for indigestion or heartburn.    Cholecalciferol (VITAMIN D-3) 5000  units TABS Take 5,000 Units by mouth daily.    diphenhydrAMINE (BENADRYL) 25 MG tablet Take 1 tablet (25 mg total) by mouth every 6 (six) hours. Qty: 12 tablet, Refills: 0    fluticasone (FLONASE) 50 MCG/ACT nasal spray Place 1 spray into both nostrils 2 (two) times daily.    furosemide (LASIX) 40 MG tablet Take 40 mg by mouth 2 (two) times daily.    glucagon (GLUCAGON EMERGENCY) 1 MG injection Inject 1 mg into the muscle See admin instructions. FOR BGL LESS THAN 70    hydrocortisone cream 1 % Apply 1 application topically every 12 (twelve) hours as needed for itching. APPLY TO HANDS AND GROIN    hydrOXYzine (ATARAX/VISTARIL) 25 MG tablet Take 25 mg by mouth every 6 (six) hours as needed for itching.    insulin lispro (HUMALOG) 100 UNIT/ML injection Inject 0-10 Units into the skin See admin instructions. THREE TIMES A DAY BEFORE MEALS PER SLIDING SCALE: BGL 0-150 = 0 units; 151-200 = 2 units; 201-250 = 4 units; 251-300 = 6 units; 301-350 = 8 units; 351-400 = 10 units; 400 or greater = CALL MD/NP/PA    magnesium oxide (MAG-OX) 400 MG tablet Take 400 mg by mouth 2 (two) times daily.    metFORMIN (GLUCOPHAGE) 850 MG tablet Take 850 mg by mouth 2 (two) times daily.    metoprolol tartrate (LOPRESSOR) 25 MG tablet Take 25 mg by mouth daily.    Multiple Vitamins-Minerals (DECUBI-VITE PO) Take 1 tablet by mouth daily.    omeprazole (PRILOSEC) 20 MG capsule Take 20 mg by mouth daily.    pyrithione zinc (SELSUN BLUE DRY SCALP) 1 % shampoo Apply 1  application topically 2 (two) times a week. APPLY TO SCALP AND FACE ON TUES & FRI    senna-docusate (SENOKOT-S) 8.6-50 MG tablet Take 2 tablets by mouth at bedtime. Qty: 30 tablet, Refills: 0    Skin Protectants, Misc. (CALAZIME SKIN PROTECTANT) PSTE Apply 1 application topically See admin instructions. APPLY TO SACRAL AREA TWO TIMES A DAY AND AS NEEDED AFTER CLEANSING FROM SOILING/WETNESS TO PREVENT BREAKDOWN    sodium chloride (OCEAN) 0.65 % SOLN nasal spray Place 1 spray into both nostrils as needed for congestion. Qty: 1 Bottle, Refills: 0    tamsulosin (FLOMAX) 0.4 MG CAPS capsule Take 0.4 mg by mouth daily.     triamcinolone cream (KENALOG) 0.1 % Apply 1 application topically every 12 (twelve) hours as needed (for rash).    vitamin B-12 (CYANOCOBALAMIN) 1000 MCG tablet Take 1,000 mcg by mouth daily.    Vitamins A & D (VITAMIN A & D) ointment Apply 1 application topically 3 (three) times daily. APPLY TO LEFT HEEL    famotidine (PEPCID) 20 MG tablet Take 1 tablet (20 mg total) by mouth 2 (two) times daily. Qty: 10 tablet, Refills: 0    polyethylene glycol (MIRALAX / GLYCOLAX) packet Take 17 g by mouth daily. Hold if diarrhea Qty: 14 each, Refills: 0      STOP taking these medications     docusate sodium (COLACE) 100 MG capsule      gabapentin (NEURONTIN) 400 MG capsule      linezolid (ZYVOX) 600 MG tablet      nystatin (MYCOSTATIN/NYSTOP) powder      oxyCODONE-acetaminophen (ROXICET) 5-325 MG tablet      potassium chloride (K-DUR) 10 MEQ tablet      clindamycin (CLEOCIN) 150 MG capsule      diazepam (VALIUM) 5 MG tablet  tizanidine (ZANAFLEX) 2 MG capsule        No Known Allergies Follow-up Information    [name], MD Follow up in 1 week(s).   Specialty:  Orthopedic Surgery Contact information: [place]  [address] [phone]            The results of significant diagnostics from this hospitalization (including  imaging, microbiology, ancillary and laboratory) are listed below for reference.    Significant Diagnostic Studies: Mr Foot Right Wo Contrast  Result Date: 03/26/2017 CLINICAL DATA:  Diabetic patient with chronic right foot ulcers. EXAM: MRI OF THE RIGHT FOREFOOT WITHOUT CONTRAST TECHNIQUE: Multiplanar, multisequence MR imaging of the right forefoot Was performed. No intravenous contrast was administered. COMPARISON:  Plain films the right foot 03/25/2017 and 10/02/2016. FINDINGS: Bones/Joint/Cartilage Bony destructive change is seen in the lateral aspect of the calcaneus where a defect measuring approximately 2 cm AP by 0.8 cm transverse is identified. There is marrow edema in the underlying calcaneus extending 3.7 cm long by approximately 1.4 cm transverse by 1.9 cm craniocaudal. Edema is moderate in intensity. Intense marrow edema and destructive change are seen in the head of the fifth metatarsal and proximal phalanx of the little toe. The distal 0.6 cm of the little toe is spared. The diaphysis of the fifth metatarsal demonstrates low level edema. Marrow edema and bony destructive change are identified about the PIP joint of the second toe consistent with septic joint and surrounding osteomyelitis. The base of the proximal phalanx and head of the middle phalanx are spared. Ligaments Intact. Muscles and Tendons Atrophy of the intrinsic musculature the foot is noted. No intramuscular fluid collection. Soft tissues Skin ulceration at the level of fifth MTP joint is noted. No soft tissue abscess. IMPRESSION: Findings consistent with osteomyelitis in the head of the fifth metatarsal and almost entire proximal phalanx of the fifth toe. Low level edema in the diaphysis of the fifth metatarsal has an appearance most suggestive of reactive change. Findings consistent with septic PIP joint of the second toe with osteomyelitis in the proximal and distal phalanges as described above. Findings consistent with chronic  osteomyelitis in the lateral aspect of the calcaneus where there is bony destructive change. Intermediate increased T2 signal in the calcaneus about the site of osteolysis is consistent with osteomyelitis. Electronically Signed   By: Inge Rise M.D.   On: 03/26/2017 13:03   Mr Toes Left Wo Contrast  Result Date: 03/26/2017 CLINICAL DATA:  Diabetic patient chronic skin ulcerations on both feet. EXAM: MRI OF THE LEFT TOES WITHOUT CONTRAST TECHNIQUE: Multiplanar, multisequence MR imaging of the left toes was performed. No intravenous contrast was administered. COMPARISON:  Plain films left foot 10/02/2016. FINDINGS: Bones/Joint/Cartilage Severe valgus angulation and plantar flexion are seen at the IP joint of the great toe. There is marrow edema throughout almost the entire distal phalanx of the great toe and in the distal 1.3 cm of the proximal phalanx. The appears to be a skin wound on the medial soft tissues of the great toe at the level of the IP joint. Increased T2 signal is seen throughout the distal phalanx of the second toe. Bone marrow signal is otherwise normal clawtoe deformities are noted. Ligaments Negative. Muscles and Tendons Atrophy of the intrinsic musculature the foot is noted. No intramuscular fluid collection. Soft tissues No soft tissue abscess is seen. IMPRESSION: Skin ulceration along the medial margin of the IP joint of the great toe with marrow edema in the proximal  and distal phalanges most consistent osteomyelitis. Severe valgus angulation and flexion deformity about the IP joint of the great toe noted. Marrow edema in the distal phalanx of the second toe most consistent osteomyelitis. Negative for soft tissue abscess. Electronically Signed   By: Inge Rise M.D.   On: 03/26/2017 13:10   Dg Foot Complete Right  Result Date: 03/25/2017 CLINICAL DATA:  56 year old male with lower extremity wounds and pain. EXAM: RIGHT FOOT COMPLETE - 3+ VIEW COMPARISON:  Foot radiographs  10/02/2016 and earlier. FINDINGS: Interval improved and largely normalized bone mineralization at the posterior calcaneus where osteolysis was noted in April. However, there is new cortical osteolysis and destruction of the head of the right second proximal phalanx (arrows). There is involvement of the right PIP and likely also the base of the second middle phalanx. No other cortical osteolysis in the right foot. There is increased distal foot soft tissue swelling. No subcutaneous gas. IMPRESSION: 1. Positive for Osteomyelitis and septic joint of the right second PIP. Destruction of the head of the right second proximal phalanx since April. 2. Associated increased distal foot soft tissue swelling. No subcutaneous gas. 3. Improved appearance of the dorsal calcaneus since April. Electronically Signed   By: Genevie Ann M.D.   On: 03/25/2017 15:29    Microbiology: Recent Results (from the past 240 hour(s))  Blood culture (routine x 2)     Status: None   Collection Time: 03/25/17  5:04 PM  Result Value Ref Range Status   Specimen Description BLOOD RIGHT HAND  Final   Special Requests   Final    BOTTLES DRAWN AEROBIC AND ANAEROBIC Blood Culture adequate volume   Culture NO GROWTH 5 DAYS  Final   Report Status 03/30/2017 FINAL  Final  Blood culture (routine x 2)     Status: None   Collection Time: 03/25/17  6:50 PM  Result Value Ref Range Status   Specimen Description BLOOD RIGHT HAND  Final   Special Requests   Final    BOTTLES DRAWN AEROBIC AND ANAEROBIC Blood Culture adequate volume   Culture NO GROWTH 5 DAYS  Final   Report Status 03/30/2017 FINAL  Final  Surgical pcr screen     Status: Abnormal   Collection Time: 03/25/17  9:13 PM  Result Value Ref Range Status   MRSA, PCR NEGATIVE NEGATIVE Final   Staphylococcus aureus POSITIVE (A) NEGATIVE Final    Comment: (NOTE) The Xpert SA Assay (FDA approved for NASAL specimens in patients 45 years of age and older), is one component of a  comprehensive surveillance program. It is not intended to diagnose infection nor to guide or monitor treatment.      Labs: Basic Metabolic Panel:  Recent Labs Lab 03/28/17 0654 03/31/17 0555  NA 137 135  K 4.1 4.2  CL 96* 97*  CO2 29 26  GLUCOSE 162* 161*  BUN 17 18  CREATININE 1.79* 2.06*  CALCIUM 9.3 8.9   Liver Function Tests:  Recent Labs Lab 03/28/17 0654  AST 28  ALT 25  ALKPHOS 92  BILITOT 0.4  PROT 7.4  ALBUMIN 3.2*   No results for input(s): LIPASE, AMYLASE in the last 168 hours. No results for input(s): AMMONIA in the last 168 hours. CBC:  Recent Labs Lab 03/28/17 0654  WBC 6.6  NEUTROABS 2.9  HGB 9.8*  HCT 32.7*  MCV 88.6  PLT 321   Cardiac Enzymes: No results for input(s): CKTOTAL, CKMB, CKMBINDEX, TROPONINI in the last 168 hours. BNP:  BNP (last 3 results) No results for input(s): BNP in the last 8760 hours.  ProBNP (last 3 results) No results for input(s): PROBNP in the last 8760 hours.  CBG:  Recent Labs Lab 04/01/17 0810 04/01/17 1214 04/01/17 1643 04/01/17 2126 04/02/17 0806  GLUCAP 137* 185* 148* 181* 132*       Signed:  Nita Sells MD   [facility] 04/02/2017, 10:10 AM

## 2017-04-02 NOTE — Clinical Social Work Placement (Signed)
   CLINICAL SOCIAL WORK PLACEMENT  NOTE  Date:  04/02/2017  Patient Details  Name: Gregory [name] MRN: [id number] Date of Birth: 1961-04-18  Clinical Social Work is seeking post-discharge placement for this patient at the [place] level of care (*CSW will initial, date and re-position this form in  chart as items are completed):  Yes   Patient/family provided with [facility] Work Department's list of facilities offering this level of care within the geographic area requested by the patient (or if unable, by the patient's family).  Yes   Patient/family informed of their freedom to choose among providers that offer the needed level of care, that participate in Medicare, Medicaid or managed care program needed by the patient, have an available bed and are willing to accept the patient.  Yes   Patient/family informed of [facility]'s ownership interest in [facility] and [facility], as well as of the fact that they are under no obligation to receive care at these facilities.  PASRR submitted to EDS on       PASRR number received on       Existing PASRR number confirmed on 03/26/17     FL2 transmitted to all facilities in geographic area requested by pt/family on       FL2 transmitted to all facilities within larger geographic area on 04/02/17     Patient informed that his/her managed care company has contracts with or will negotiate with certain facilities, including the following:        Yes   Patient/family informed of bed offers received.  Patient chooses bed at [facility]     Physician recommends and patient chooses bed at      Patient to be transferred to [facility] on 04/02/17.  Patient to be transferred to facility by [facility]     Patient family notified on 04/02/17 of transfer.  Name of family member notified:  patient responsible for self     PHYSICIAN Please prepare  prescriptions     Additional Comment:    _______________________________________________ Normajean Baxter, LCSW 04/02/2017, 10:56 AM

## 2017-04-02 NOTE — Progress Notes (Signed)
Patient ID: Gregory [name], male   DOB: 1961/02/24, 56 y.o.   MRN: [id number] The wound VAC is clean and dry. Anticipate discharge today. We'll have the wound VAC removed on the right BKA and the left foot dressing changed prior to discharge.

## 2017-04-09 ENCOUNTER — Ambulatory Visit (INDEPENDENT_AMBULATORY_CARE_PROVIDER_SITE_OTHER): Payer: Medicaid Other | Admitting: Orthopedic Surgery

## 2017-04-09 ENCOUNTER — Encounter (INDEPENDENT_AMBULATORY_CARE_PROVIDER_SITE_OTHER): Payer: Self-pay | Admitting: Orthopedic Surgery

## 2017-04-09 DIAGNOSIS — Z89412 Acquired absence of left great toe: Secondary | ICD-10-CM

## 2017-04-09 DIAGNOSIS — IMO0002 Reserved for concepts with insufficient information to code with codable children: Secondary | ICD-10-CM

## 2017-04-09 DIAGNOSIS — Z89511 Acquired absence of right leg below knee: Secondary | ICD-10-CM

## 2017-04-09 NOTE — Progress Notes (Signed)
Office Visit Note   Patient: Gregory [name]           Date of Birth: [birth year]           MRN: [id number] Visit Date: 04/09/2017              Requested by: No referring provider defined for this encounter. PCP: Patient, No Pcp Per  Chief Complaint  Patient presents with  . Left Great Toe - Routine Post Op    03/29/17 Left Great Toe Amputation at [facility]  . Right Leg - Routine Post Op    03/29/17 RBKA      HPI: Patient is a 56 year old gentleman currently living at skilled nursing status post right transtibial amputation and left great toe amputation 10 days ago.  Patient complains of throbbing pain from swelling in the right below the knee amputation.  Assessment & Plan: Visit Diagnoses:  1. S/P below knee amputation, right ([place])   2. Great toe amputation status, left ([place])     Plan: Recommended elevation.  Patient has worked with level for the past we will set him up for a stump shrinker and a K3 level prosthesis.  Patient does have the strength and ability to use a K3 level prosthesis and does require a prosthesis where he can walk up and down bleachers and on uneven terrain for his activities of daily living.  Patient was given instructions to wear the stump shrinker around the clock to wash his incisions daily with soap and water and to keep the right lower extremity elevated he has good extension he will continue working on extension.  Follow-Up Instructions: Return in about 1 week (around 04/16/2017).   Ortho Exam  Patient is alert, oriented, no adenopathy, well-dressed, normal affect, normal respiratory effort. On examination both surgical incisions are well-healed there is no cellulitis no drainage no dehiscence no signs of infection.  Patient has tight swelling in the right transtibial amputation there is minimal swelling in the left foot.  Imaging: No results found. No images are attached to the encounter.  Labs: Lab Results  Component Value Date   HGBA1C 7.2  (H) 03/25/2017   HGBA1C 5.6 08/14/2016   HGBA1C 5.5 01/18/2016   ESRSEDRATE 120 (H) 03/25/2017   ESRSEDRATE 84 (H) 10/02/2016   ESRSEDRATE 132 (H) 12/01/2015   CRP 4.9 (H) 03/25/2017   CRP 30.6 (H) 10/02/2016   CRP 13.7 (H) 11/11/2015   REPTSTATUS 03/30/2017 FINAL 03/25/2017   GRAMSTAIN  11/11/2015    ABUNDANT WBC PRESENT,BOTH PMN AND MONONUCLEAR RARE SQUAMOUS EPITHELIAL CELLS PRESENT FEW GRAM POSITIVE COCCI IN PAIRS RARE GRAM POSITIVE RODS RARE GRAM NEGATIVE COCCOBACILLI    CULT NO GROWTH 5 DAYS 03/25/2017   LABORGA PROVIDENCIA [name] (A) 01/12/2017    Orders:  No orders of the defined types were placed in this encounter.  No orders of the defined types were placed in this encounter.    Procedures: No procedures performed  Clinical Data: No additional findings.  ROS:  All other systems negative, except as noted in the HPI. Review of Systems  Objective: Vital Signs: There were no vitals taken for this visit.  Specialty Comments:  No specialty comments available.  PMFS History: Patient Active Problem List   Diagnosis Date Noted  . Osteomyelitis ([place]) 03/25/2017  . Skin cancer 03/25/2017  . Depression, recurrent ([place]) 11/05/2016  . Acute osteomyelitis of calcaneum, right ([place]) 10/02/2016  . Diabetic ulcer of toe of left foot ([place]) 10/02/2016  . Epidural abscess  10/02/2016  . Chronic pain 09/27/2016  . Dyslipidemia associated with type 2 diabetes mellitus ([place]) 06/06/2016  . GERD without esophagitis 05/11/2016  . Hypertensive heart disease with congestive heart failure ([place]) 12/15/2015  . Spinal stenosis in cervical region 12/15/2015  . Type II diabetes mellitus with neurological manifestations ([place]) 12/15/2015  . Chronic diastolic CHF (congestive heart failure) ([place])   . Pulmonary hypertension ([place])   . Urinary retention   . Foot drop, bilateral   . Pressure ulcer 11/10/2015  . MRSA bacteremia 10/03/2015  . COPD (chronic obstructive pulmonary disease)  ([place]) 10/03/2015   Past Medical History:  Diagnosis Date  . Acute osteomyelitis of calcaneum, right ([place]) 10/02/2016  . Acute respiratory failure ([place])   . Anemia   . Basal cell carcinoma, eyelid   . Cancer ([place])   . Chronic back pain   . COPD (chronic obstructive pulmonary disease) ([place])   . DDD (degenerative disc disease), lumbar   . Diabetes mellitus without complication ([place])   . Diabetic ulcer of toe of left foot ([place]) 10/02/2016  . 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 Foot drop, bilateral   . Foot drop, right 12/27/2015  . Hyperlipidemia   . Hypertension   . MRSA bacteremia   . Osteomyelitis ([place])   . Osteomyelitis ([place])   . Urinary retention   . Urinary retention     Family History  Problem Relation Age of Onset  . Cancer Other   . Diabetes Other     Past Surgical History:  Procedure Laterality Date  . AMPUTATION Left 03/29/2017   Procedure: LEFT GREAT TOE AMPUTATION AT METATARSOPHALANGEAL JOINT;  Surgeon: [name], MD;  Location: [facility];  Service: Orthopedics;  Laterality: Left;  . AMPUTATION Right 03/29/2017   Procedure: RIGHT BELOW KNEE AMPUTATION;  Surgeon: [name], MD;  Location: [facility];  Service: Orthopedics;  Laterality: Right;  . ANTERIOR CERVICAL CORPECTOMY N/A 11/25/2015   Procedure: ANTERIOR CERVICAL FIVE CORPECTOMY Cervical four - six fusion;  Surgeon: Consuella Lose, MD;  Location: [facility];  Service: Neurosurgery;  Laterality: N/A;  ANTERIOR CERVICAL FIVE CORPECTOMY Cervical four - six fusion  . POSTERIOR CERVICAL FUSION/FORAMINOTOMY N/A 11/29/2015   Procedure: Cervical Three-Cervical Seven Posterior Cervical Laminectomy with Fusion;  Surgeon: Consuella Lose, MD;  Location: [facility];  Service: Neurosurgery;  Laterality: N/A;  Cervical Three-Cervical Seven Posterior Cervical Laminectomy with Fusion   Social History   Occupational History  . Not on file.   Social History Main Topics  . Smoking status: Former Smoker    Packs/day: 3.00     Types: Cigarettes  . Smokeless tobacco: Never Used  . Alcohol use No  . Drug use: No  . Sexual activity: Not on file

## 2017-04-16 ENCOUNTER — Encounter (INDEPENDENT_AMBULATORY_CARE_PROVIDER_SITE_OTHER): Payer: Self-pay | Admitting: Orthopedic Surgery

## 2017-04-16 ENCOUNTER — Ambulatory Visit (INDEPENDENT_AMBULATORY_CARE_PROVIDER_SITE_OTHER): Payer: Medicaid Other | Admitting: Orthopedic Surgery

## 2017-04-16 DIAGNOSIS — Z89511 Acquired absence of right leg below knee: Secondary | ICD-10-CM

## 2017-04-16 DIAGNOSIS — C4330 Malignant melanoma of unspecified part of face: Secondary | ICD-10-CM | POA: Insufficient documentation

## 2017-04-16 DIAGNOSIS — Z89412 Acquired absence of left great toe: Secondary | ICD-10-CM

## 2017-04-16 DIAGNOSIS — IMO0002 Reserved for concepts with insufficient information to code with codable children: Secondary | ICD-10-CM

## 2017-04-16 NOTE — Progress Notes (Signed)
Office Visit Note   Patient: Gregory [name]           Date of Birth: [birth year]           MRN: [id number] Visit Date: 04/16/2017              Requested by: No referring provider defined for this encounter. PCP: Patient, No Pcp Per  Chief Complaint  Patient presents with  . Left Foot - Routine Post Op  . Right Leg - Routine Post Op      HPI: Patient presents in follow-up 3 weeks status post right transtibial amputation and left great toe amputation.  Patient has not been wearing his stump shrinker he has an old dressing over the incision with maceration beneath the dressing.  Assessment & Plan: Visit Diagnoses:  1. S/P below knee amputation, right ([place])   2. Great toe amputation status, left (H[facility])     Plan: Staples are harvested right transtibial amputation sutures were harvested from the left great toe discussed the importance of heel cord stretching this was demonstrated to him and his daughter.  He should do this repetitively during the day on the left knee extension exercises on the right wash the right leg with soap and water daily and wear a stump shrinker around the clock.  A second prescription was provided for level 4 orthotics and prosthetics for stump shrinker on the right to be worn 24 hours a day changed with a new shrinker daily a K2 level prosthesis on the right for liner socket materials foot and ankle.  Follow-Up Instructions: Return in about 4 weeks (around 05/14/2017).   Ortho Exam  Patient is alert, oriented, no adenopathy, well-dressed, normal affect, normal respiratory effort. Examination patient's surgical incisions are well-healed he does have heel cord tightness on the left secondary to his previous stroke he has some abrasions on the second toe from him rubbing his foot against the end of the bed.  There is no skin breakdown no signs of infection of the second toe.  Right transtibial amputation has maceration secondary to an old nonadherent dressing against  the wound.  There is no cellulitis no tenderness to palpation he has full active extension.  Flexion on the left is about 10 degrees short of neutral.  With pressure I can get him to neutral.  Imaging: No results found. No images are attached to the encounter.  Labs: Lab Results  Component Value Date   HGBA1C 7.2 (H) 03/25/2017   HGBA1C 5.6 08/14/2016   HGBA1C 5.5 01/18/2016   ESRSEDRATE 120 (H) 03/25/2017   ESRSEDRATE 84 (H) 10/02/2016   ESRSEDRATE 132 (H) 12/01/2015   CRP 4.9 (H) 03/25/2017   CRP 30.6 (H) 10/02/2016   CRP 13.7 (H) 11/11/2015   REPTSTATUS 03/30/2017 FINAL 03/25/2017   GRAMSTAIN  11/11/2015    ABUNDANT WBC PRESENT,BOTH PMN AND MONONUCLEAR RARE SQUAMOUS EPITHELIAL CELLS PRESENT FEW GRAM POSITIVE COCCI IN PAIRS RARE GRAM POSITIVE RODS RARE GRAM NEGATIVE COCCOBACILLI    CULT NO GROWTH 5 DAYS 03/25/2017   LABORGA PROVIDENCIA [name] (A) 01/12/2017    Orders:  No orders of the defined types were placed in this encounter.  No orders of the defined types were placed in this encounter.    Procedures: No procedures performed  Clinical Data: No additional findings.  ROS:  All other systems negative, except as noted in the HPI. Review of Systems  Objective: Vital Signs: There were no vitals taken for this visit.  Specialty Comments:  No specialty comments available.  PMFS History: Patient Active Problem List   Diagnosis Date Noted  . Osteomyelitis ([place]) 03/25/2017  . Skin cancer 03/25/2017  . Depression, recurrent ([place]) 11/05/2016  . Acute osteomyelitis of calcaneum, right ([place]) 10/02/2016  . Diabetic ulcer of toe of left foot ([place]) 10/02/2016  . 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 Chronic pain 09/27/2016  . Dyslipidemia associated with type 2 diabetes mellitus ([place]) 06/06/2016  . GERD without esophagitis 05/11/2016  . Hypertensive heart disease with congestive heart failure ([place]) 12/15/2015  . Spinal stenosis in cervical region 12/15/2015  . Type  II diabetes mellitus with neurological manifestations ([place]) 12/15/2015  . Chronic diastolic CHF (congestive heart failure) ([place])   . Pulmonary hypertension ([place])   . Urinary retention   . Foot drop, bilateral   . Pressure ulcer 11/10/2015  . MRSA bacteremia 10/03/2015  . COPD (chronic obstructive pulmonary disease) ([place]) 10/03/2015   Past Medical History:  Diagnosis Date  . Acute osteomyelitis of calcaneum, right ([place]) 10/02/2016  . Acute respiratory failure ([place])   . Anemia   . Basal cell carcinoma, eyelid   . Cancer ([place])   . Chronic back pain   . COPD (chronic obstructive pulmonary disease) ([place])   . DDD (degenerative disc disease), lumbar   . Diabetes mellitus without complication ([place])   . Diabetic ulcer of toe of left foot ([place]) 10/02/2016  . 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 Foot drop, bilateral   . Foot drop, right 12/27/2015  . Hyperlipidemia   . Hypertension   . MRSA bacteremia   . Osteomyelitis ([place])   . Osteomyelitis ([place])   . Urinary retention   . Urinary retention     Family History  Problem Relation Age of Onset  . Cancer Other   . Diabetes Other     Past Surgical History:  Procedure Laterality Date  . AMPUTATION Left 03/29/2017   Procedure: LEFT GREAT TOE AMPUTATION AT METATARSOPHALANGEAL JOINT;  Surgeon: [name], MD;  Location: [facility];  Service: Orthopedics;  Laterality: Left;  . AMPUTATION Right 03/29/2017   Procedure: RIGHT BELOW KNEE AMPUTATION;  Surgeon: [name], MD;  Location: [facility];  Service: Orthopedics;  Laterality: Right;  . ANTERIOR CERVICAL CORPECTOMY N/A 11/25/2015   Procedure: ANTERIOR CERVICAL FIVE CORPECTOMY Cervical four - six fusion;  Surgeon: Consuella Lose, MD;  Location: [facility];  Service: Neurosurgery;  Laterality: N/A;  ANTERIOR CERVICAL FIVE CORPECTOMY Cervical four - six fusion  . POSTERIOR CERVICAL FUSION/FORAMINOTOMY N/A 11/29/2015   Procedure: Cervical Three-Cervical Seven Posterior Cervical Laminectomy with Fusion;   Surgeon: Consuella Lose, MD;  Location: [facility];  Service: Neurosurgery;  Laterality: N/A;  Cervical Three-Cervical Seven Posterior Cervical Laminectomy with Fusion   Social History   Occupational History  . Not on file.   Social History Main Topics  . Smoking status: Former Smoker    Packs/day: 3.00    Types: Cigarettes  . Smokeless tobacco: Never Used  . Alcohol use No  . Drug use: No  . Sexual activity: Not on file

## 2017-05-13 ENCOUNTER — Ambulatory Visit (INDEPENDENT_AMBULATORY_CARE_PROVIDER_SITE_OTHER): Payer: Medicaid Other | Admitting: Orthopedic Surgery

## 2017-05-29 ENCOUNTER — Ambulatory Visit (INDEPENDENT_AMBULATORY_CARE_PROVIDER_SITE_OTHER): Payer: Medicaid Other | Admitting: Family

## 2017-05-29 DIAGNOSIS — Z89511 Acquired absence of right leg below knee: Secondary | ICD-10-CM

## 2017-05-29 DIAGNOSIS — M21371 Foot drop, right foot: Secondary | ICD-10-CM

## 2017-05-29 DIAGNOSIS — E08621 Diabetes mellitus due to underlying condition with foot ulcer: Secondary | ICD-10-CM

## 2017-05-29 DIAGNOSIS — L97522 Non-pressure chronic ulcer of other part of left foot with fat layer exposed: Secondary | ICD-10-CM

## 2017-05-29 DIAGNOSIS — M21372 Foot drop, left foot: Secondary | ICD-10-CM

## 2017-05-29 MED ORDER — SILVER SULFADIAZINE 1 % EX CREA: 1.0000 "application " | TOPICAL_CREAM | Freq: Every day | CUTANEOUS | 0 refills | Status: DC

## 2017-05-30 ENCOUNTER — Encounter (INDEPENDENT_AMBULATORY_CARE_PROVIDER_SITE_OTHER): Payer: Self-pay | Admitting: Family

## 2017-05-30 NOTE — Progress Notes (Signed)
Office Visit Note   Patient: Gregory [name]           Date of Birth: 1960/11/09           MRN: [id number] Visit Date: 05/29/2017              Requested by: No referring provider defined for this encounter. PCP: Patient, No Pcp Per  No chief complaint on file.     HPI: Patient presents in follow-up status post right transtibial amputation and left great toe amputation.  Patient has not been wearing his stump shrinker. Today is concerned about an ulcer to his left second toe tip, which is new.  Assessment & Plan: Visit Diagnoses:  No diagnosis found.  Plan: Continue to wash the right leg and left foot with soap and water daily and wear a stump shrinker around the clock.  Follow-Up Instructions: No Follow-up on file.   Ortho Exam  Patient is alert, oriented, no adenopathy, well-dressed, normal affect, normal respiratory effort. Examination patient's surgical incisions are well-healed he does have heel cord tightness on the left secondary to his previous stroke. Has a 3 mm in diameter ulcer to tip of left second toe, with no depth. No exudative tissue.  no signs of infection of the second toe.  Right transtibial amputation has a medial and lateral ulceration. Are 15 mm in diameter and 10 mm in diameter respectively. Are 3 mm deep. 100% filled with exudative tissue. There is no cellulitis no tenderness. No drainage.   Imaging: No results found. No images are attached to the encounter.  Labs: Lab Results  Component Value Date   HGBA1C 7.2 (H) 03/25/2017   HGBA1C 5.6 08/14/2016   HGBA1C 5.5 01/18/2016   ESRSEDRATE 120 (H) 03/25/2017   ESRSEDRATE 84 (H) 10/02/2016   ESRSEDRATE 132 (H) 12/01/2015   CRP 4.9 (H) 03/25/2017   CRP 30.6 (H) 10/02/2016   CRP 13.7 (H) 11/11/2015   REPTSTATUS 03/30/2017 FINAL 03/25/2017   GRAMSTAIN  11/11/2015    ABUNDANT WBC PRESENT,BOTH PMN AND MONONUCLEAR RARE SQUAMOUS EPITHELIAL CELLS PRESENT FEW GRAM POSITIVE COCCI IN PAIRS RARE GRAM POSITIVE  RODS RARE GRAM NEGATIVE COCCOBACILLI    CULT NO GROWTH 5 DAYS 03/25/2017   LABORGA PROVIDENCIA [name] (A) 01/12/2017    Orders:  No orders of the defined types were placed in this encounter.  Meds ordered this encounter  Medications  . silver sulfADIAZINE (SILVADENE) 1 % cream    Sig: Apply 1 application topically daily.    Dispense:  50 g    Refill:  0     Procedures: No procedures performed  Clinical Data: No additional findings.  ROS:  All other systems negative, except as noted in the HPI. Review of Systems  Constitutional: Negative for chills and fever.  Skin: Positive for wound. Negative for color change.    Objective: Vital Signs: There were no vitals taken for this visit.  Specialty Comments:  No specialty comments available.  PMFS History: Patient Active Problem List   Diagnosis Date Noted  . Osteomyelitis ([place]) 03/25/2017  . Skin cancer 03/25/2017  . Depression, recurrent ([place]) 11/05/2016  . Acute osteomyelitis of calcaneum, right ([place]) 10/02/2016  . Diabetic ulcer of toe of left foot ([place]) 10/02/2016  . 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 Chronic pain 09/27/2016  . Dyslipidemia associated with type 2 diabetes mellitus ([place]) 06/06/2016  . GERD without esophagitis 05/11/2016  . Hypertensive heart disease with congestive heart failure ([place]) 12/15/2015  . Spinal stenosis in cervical  region 12/15/2015  . Type II diabetes mellitus with neurological manifestations ([place]) 12/15/2015  . Chronic diastolic CHF (congestive heart failure) ([place])   . Pulmonary hypertension ([place])   . Urinary retention   . Foot drop, bilateral   . Pressure ulcer 11/10/2015  . MRSA bacteremia 10/03/2015  . COPD (chronic obstructive pulmonary disease) ([place]) 10/03/2015   Past Medical History:  Diagnosis Date  . Acute osteomyelitis of calcaneum, right ([place]) 10/02/2016  . Acute respiratory failure ([place])   . Anemia   . Basal cell carcinoma, eyelid   . Cancer ([place])   . Chronic back  pain   . COPD (chronic obstructive pulmonary disease) ([place])   . DDD (degenerative disc disease), lumbar   . Diabetes mellitus without complication ([place])   . Diabetic ulcer of toe of left foot ([place]) 10/02/2016  . 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 Foot drop, bilateral   . Foot drop, right 12/27/2015  . Hyperlipidemia   . Hypertension   . MRSA bacteremia   . Osteomyelitis ([place])   . Osteomyelitis ([place])   . Urinary retention   . Urinary retention     Family History  Problem Relation Age of Onset  . Cancer Other   . Diabetes Other     Past Surgical History:  Procedure Laterality Date  . AMPUTATION Left 03/29/2017   Procedure: LEFT GREAT TOE AMPUTATION AT METATARSOPHALANGEAL JOINT;  Surgeon: [name], MD;  Location: [facility];  Service: Orthopedics;  Laterality: Left;  . AMPUTATION Right 03/29/2017   Procedure: RIGHT BELOW KNEE AMPUTATION;  Surgeon: [name], MD;  Location: [facility];  Service: Orthopedics;  Laterality: Right;  . ANTERIOR CERVICAL CORPECTOMY N/A 11/25/2015   Procedure: ANTERIOR CERVICAL FIVE CORPECTOMY Cervical four - six fusion;  Surgeon: Consuella Lose, MD;  Location: [facility];  Service: Neurosurgery;  Laterality: N/A;  ANTERIOR CERVICAL FIVE CORPECTOMY Cervical four - six fusion  . POSTERIOR CERVICAL FUSION/FORAMINOTOMY N/A 11/29/2015   Procedure: Cervical Three-Cervical Seven Posterior Cervical Laminectomy with Fusion;  Surgeon: Consuella Lose, MD;  Location: [facility];  Service: Neurosurgery;  Laterality: N/A;  Cervical Three-Cervical Seven Posterior Cervical Laminectomy with Fusion   Social History   Occupational History  . Not on file  Tobacco Use  . Smoking status: Former Smoker    Packs/day: 3.00    Types: Cigarettes  . Smokeless tobacco: Never Used  Substance and Sexual Activity  . Alcohol use: No  . Drug use: No  . Sexual activity: Not on file

## 2017-06-20 ENCOUNTER — Ambulatory Visit (INDEPENDENT_AMBULATORY_CARE_PROVIDER_SITE_OTHER): Payer: Medicaid Other | Admitting: Orthopedic Surgery

## 2017-06-20 ENCOUNTER — Encounter (INDEPENDENT_AMBULATORY_CARE_PROVIDER_SITE_OTHER): Payer: Self-pay | Admitting: Orthopedic Surgery

## 2017-06-20 DIAGNOSIS — Z89511 Acquired absence of right leg below knee: Secondary | ICD-10-CM

## 2017-06-20 DIAGNOSIS — L97521 Non-pressure chronic ulcer of other part of left foot limited to breakdown of skin: Secondary | ICD-10-CM

## 2017-06-20 MED ORDER — DOXYCYCLINE HYCLATE 100 MG PO TABS: 100.0000 mg | ORAL_TABLET | Freq: Two times a day (BID) | ORAL | 0 refills | Status: DC

## 2017-06-20 NOTE — Progress Notes (Signed)
Office Visit Note   Patient: Gregory [name]           Date of Birth: September 27, 1960           MRN: [id number] Visit Date: 06/20/2017              Requested by: No referring provider defined for this encounter. PCP: Patient, No Pcp Per  Chief Complaint  Patient presents with  . 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 Left Foot - Follow-up    03/29/17 LT GT Amputation at [facility]      HPI: Patient presents in follow-up with ulceration swelling left second toe and 2 persistent ulcers of the right transtibial amputation.  Patient did have a culture swab positive for MRSA he was given 7 days of doxycycline.  Assessment & Plan: Visit Diagnoses: No diagnosis found.  Plan: We will plan for another month of doxycycline the wounds on the right transtibial amputation appear to be healing well.  Concerned with the swelling and redness of the second toe of the left foot.  Follow-up in 2 weeks.  Follow-Up Instructions: No Follow-up on file.   Ortho Exam  Patient is alert, oriented, no adenopathy, well-dressed, normal affect, normal respiratory effort. Examination patient has a wound medially and laterally of the right transtibial amputation remains have approximately 50% fibrinous base they are about 10 mm in diameter 1 mm deep there is no exposed tendon bone there is no drainage no cellulitis patient does have swelling in the importance of wearing a stump shrinker was discussed.  Left foot he has redness and swelling of the second toe there is no open ulcers or drainage on concern for possible deep bone infection.  We will place him on doxycycline for a month.  Imaging: No results found. No images are attached to the encounter.  Labs: Lab Results  Component Value Date   HGBA1C 7.2 (H) 03/25/2017   HGBA1C 5.6 08/14/2016   HGBA1C 5.5 01/18/2016   ESRSEDRATE 120 (H) 03/25/2017   ESRSEDRATE 84 (H) 10/02/2016   ESRSEDRATE 132 (H) 12/01/2015   CRP 4.9 (H) 03/25/2017   CRP 30.6 (H)  10/02/2016   CRP 13.7 (H) 11/11/2015   REPTSTATUS 03/30/2017 FINAL 03/25/2017   GRAMSTAIN  11/11/2015    ABUNDANT WBC PRESENT,BOTH PMN AND MONONUCLEAR RARE SQUAMOUS EPITHELIAL CELLS PRESENT FEW GRAM POSITIVE COCCI IN PAIRS RARE GRAM POSITIVE RODS RARE GRAM NEGATIVE COCCOBACILLI    CULT NO GROWTH 5 DAYS 03/25/2017   LABORGA PROVIDENCIA [name] (A) 01/12/2017    @LABSALLVALUES (HGBA1)@  There is no height or weight on file to calculate BMI.  Orders:  No orders of the defined types were placed in this encounter.  Meds ordered this encounter  Medications  . doxycycline (VIBRA-TABS) 100 MG tablet    Sig: Take 1 tablet (100 mg total) by mouth 2 (two) times daily.    Dispense:  60 tablet    Refill:  0     Procedures: No procedures performed  Clinical Data: No additional findings.  ROS:  All other systems negative, except as noted in the HPI. Review of Systems  Objective: Vital Signs: There were no vitals taken for this visit.  Specialty Comments:  No specialty comments available.  PMFS History: Patient Active Problem List   Diagnosis Date Noted  . Osteomyelitis ([place]) 03/25/2017  . Skin cancer 03/25/2017  . Depression, recurrent ([place]) 11/05/2016  . Diabetic ulcer of toe of left foot ([place]) 10/02/2016  .  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 Chronic pain 09/27/2016  . Dyslipidemia associated with type 2 diabetes mellitus ([place]) 06/06/2016  . GERD without esophagitis 05/11/2016  . Hypertensive heart disease with congestive heart failure ([place]) 12/15/2015  . Spinal stenosis in cervical region 12/15/2015  . Type II diabetes mellitus with neurological manifestations ([place]) 12/15/2015  . Chronic diastolic CHF (congestive heart failure) ([place])   . Pulmonary hypertension ([place])   . Urinary retention   . Foot drop, bilateral   . MRSA bacteremia 10/03/2015  . COPD (chronic obstructive pulmonary disease) ([place]) 10/03/2015   Past Medical History:  Diagnosis Date  . Acute  osteomyelitis of calcaneum, right ([place]) 10/02/2016  . Acute respiratory failure ([place])   . Anemia   . Basal cell carcinoma, eyelid   . Cancer ([place])   . Chronic back pain   . COPD (chronic obstructive pulmonary disease) ([place])   . DDD (degenerative disc disease), lumbar   . Diabetes mellitus without complication ([place])   . Diabetic ulcer of toe of left foot ([place]) 10/02/2016  . 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 Foot drop, bilateral   . Foot drop, right 12/27/2015  . Hyperlipidemia   . Hypertension   . MRSA bacteremia   . Osteomyelitis ([place])   . Osteomyelitis ([place])   . Urinary retention   . Urinary retention     Family History  Problem Relation Age of Onset  . Cancer Other   . Diabetes Other     Past Surgical History:  Procedure Laterality Date  . AMPUTATION Left 03/29/2017   Procedure: LEFT GREAT TOE AMPUTATION AT METATARSOPHALANGEAL JOINT;  Surgeon: [name], MD;  Location: [facility];  Service: Orthopedics;  Laterality: Left;  . AMPUTATION Right 03/29/2017   Procedure: RIGHT BELOW KNEE AMPUTATION;  Surgeon: [name], MD;  Location: [facility];  Service: Orthopedics;  Laterality: Right;  . ANTERIOR CERVICAL CORPECTOMY N/A 11/25/2015   Procedure: ANTERIOR CERVICAL FIVE CORPECTOMY Cervical four - six fusion;  Surgeon: Consuella Lose, MD;  Location: [facility];  Service: Neurosurgery;  Laterality: N/A;  ANTERIOR CERVICAL FIVE CORPECTOMY Cervical four - six fusion  . POSTERIOR CERVICAL FUSION/FORAMINOTOMY N/A 11/29/2015   Procedure: Cervical Three-Cervical Seven Posterior Cervical Laminectomy with Fusion;  Surgeon: Consuella Lose, MD;  Location: [facility];  Service: Neurosurgery;  Laterality: N/A;  Cervical Three-Cervical Seven Posterior Cervical Laminectomy with Fusion   Social History   Occupational History  . Not on file  Tobacco Use  . Smoking status: Former Smoker    Packs/day: 3.00    Types: Cigarettes  . Smokeless tobacco: Never Used  Substance and Sexual Activity  .  Alcohol use: No  . Drug use: No  . Sexual activity: Not on file

## 2017-07-04 ENCOUNTER — Encounter (INDEPENDENT_AMBULATORY_CARE_PROVIDER_SITE_OTHER): Payer: Self-pay | Admitting: Orthopedic Surgery

## 2017-07-04 ENCOUNTER — Ambulatory Visit (INDEPENDENT_AMBULATORY_CARE_PROVIDER_SITE_OTHER): Payer: Medicaid Other | Admitting: Family

## 2017-07-04 VITALS — Ht 72.0 in | Wt 220.0 lb

## 2017-07-04 DIAGNOSIS — E08621 Diabetes mellitus due to underlying condition with foot ulcer: Secondary | ICD-10-CM | POA: Diagnosis not present

## 2017-07-04 DIAGNOSIS — L97522 Non-pressure chronic ulcer of other part of left foot with fat layer exposed: Secondary | ICD-10-CM | POA: Diagnosis not present

## 2017-07-04 DIAGNOSIS — L97521 Non-pressure chronic ulcer of other part of left foot limited to breakdown of skin: Secondary | ICD-10-CM | POA: Diagnosis not present

## 2017-07-04 DIAGNOSIS — Z89511 Acquired absence of right leg below knee: Secondary | ICD-10-CM | POA: Diagnosis not present

## 2017-07-04 NOTE — Progress Notes (Signed)
Office Visit Note   Patient: Gregory [name]           Date of Birth: Nov 04, 1960           MRN: [id number] Visit Date: 07/04/2017              Requested by: No referring provider defined for this encounter. PCP: Patient, No Pcp Per  Chief Complaint  Patient presents with  . Right Leg - Follow-up    BKA ulceration   . Left Foot - Follow-up    03/29/17 GT amp at [facility]      HPI: Patient presents in follow-up with ulceration swelling left second toe and 2 persistent ulcers of the right transtibial amputation. Is doing silvadene dressing changes.  Patient did have a culture swab positive for MRSA he was given 7 days of doxycycline.  Assessment & Plan: Visit Diagnoses:  1. Acquired absence of right leg below knee ([place])   2. Ulcer of toe of left foot, limited to breakdown of skin ([place])   3. Diabetic ulcer of toe of left foot associated with diabetes mellitus due to underlying condition, with fat layer exposed ([place])     Plan: We will plan for another month of doxycycline the wounds on the right transtibial amputation appear to be healing well.  the swelling and redness of the second toe of the left foot is resolving.  Follow-up in 2 weeks.  Follow-Up Instructions: Return in about 4 weeks (around 08/01/2017).   Ortho Exam  Patient is alert, oriented, no adenopathy, well-dressed, normal affect, normal respiratory effort. Examination patient has a wound medially and laterally of the right transtibial amputation remains have approximately 50% fibrinous base they are about 5 mm in diameter 1 mm deep there is no exposed tendon bone there is no drainage no cellulitis patient does have swelling in the importance of wearing a stump shrinker was discussed.  Left foot he has improved redness and swelling of the second toe. There an open ulcer beneath nail plate, is 3 mm in diameter and 1 mm deep. Does not probe to bone. no drainage or concern for possible deep bone infection.    Imaging: No results  found. No images are attached to the encounter.  Labs: Lab Results  Component Value Date   HGBA1C 7.2 (H) 03/25/2017   HGBA1C 5.6 08/14/2016   HGBA1C 5.5 01/18/2016   ESRSEDRATE 120 (H) 03/25/2017   ESRSEDRATE 84 (H) 10/02/2016   ESRSEDRATE 132 (H) 12/01/2015   CRP 4.9 (H) 03/25/2017   CRP 30.6 (H) 10/02/2016   CRP 13.7 (H) 11/11/2015   REPTSTATUS 03/30/2017 FINAL 03/25/2017   GRAMSTAIN  11/11/2015    ABUNDANT WBC PRESENT,BOTH PMN AND MONONUCLEAR RARE SQUAMOUS EPITHELIAL CELLS PRESENT FEW GRAM POSITIVE COCCI IN PAIRS RARE GRAM POSITIVE RODS RARE GRAM NEGATIVE COCCOBACILLI    CULT NO GROWTH 5 DAYS 03/25/2017   LABORGA PROVIDENCIA [name] (A) 01/12/2017    @LABSALLVALUES (HGBA1)@  Body mass index is 29.84 kg/m.  Orders:  No orders of the defined types were placed in this encounter.  No orders of the defined types were placed in this encounter.    Procedures: No procedures performed  Clinical Data: No additional findings.  ROS:  All other systems negative, except as noted in the HPI. Review of Systems  Constitutional: Negative for chills and fever.  Skin: Positive for color change and wound.    Objective: Vital Signs: Ht 6' (1.829 m)   Wt 220 lb (99.8 kg)   BMI  29.84 kg/m   Specialty Comments:  No specialty comments available.  PMFS History: Patient Active Problem List   Diagnosis Date Noted  . Ulcer of toe of left foot, limited to breakdown of skin ([place]) 06/20/2017  . Acquired absence of right leg below knee ([place]) 06/20/2017  . Osteomyelitis ([place]) 03/25/2017  . Skin cancer 03/25/2017  . Depression, recurrent ([place]) 11/05/2016  . Diabetic ulcer of toe of left foot ([place]) 10/02/2016  . 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 Chronic pain 09/27/2016  . Dyslipidemia associated with type 2 diabetes mellitus ([place]) 06/06/2016  . GERD without esophagitis 05/11/2016  . Hypertensive heart disease with congestive heart failure ([place]) 12/15/2015  . Spinal stenosis in  cervical region 12/15/2015  . Type II diabetes mellitus with neurological manifestations ([place]) 12/15/2015  . Chronic diastolic CHF (congestive heart failure) ([place])   . Pulmonary hypertension ([place])   . Urinary retention   . Foot drop, bilateral   . MRSA bacteremia 10/03/2015  . COPD (chronic obstructive pulmonary disease) ([place]) 10/03/2015   Past Medical History:  Diagnosis Date  . Acute osteomyelitis of calcaneum, right ([place]) 10/02/2016  . Acute respiratory failure ([place])   . Anemia   . Basal cell carcinoma, eyelid   . Cancer ([place])   . Chronic back pain   . COPD (chronic obstructive pulmonary disease) ([place])   . DDD (degenerative disc disease), lumbar   . Diabetes mellitus without complication ([place])   . Diabetic ulcer of toe of left foot ([place]) 10/02/2016  . 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 Foot drop, bilateral   . Foot drop, right 12/27/2015  . Hyperlipidemia   . Hypertension   . MRSA bacteremia   . Osteomyelitis ([place])   . Osteomyelitis ([place])   . Urinary retention   . Urinary retention     Family History  Problem Relation Age of Onset  . Cancer Other   . Diabetes Other     Past Surgical History:  Procedure Laterality Date  . AMPUTATION Left 03/29/2017   Procedure: LEFT GREAT TOE AMPUTATION AT METATARSOPHALANGEAL JOINT;  Surgeon: [name], MD;  Location: [facility];  Service: Orthopedics;  Laterality: Left;  . AMPUTATION Right 03/29/2017   Procedure: RIGHT BELOW KNEE AMPUTATION;  Surgeon: [name], MD;  Location: [facility];  Service: Orthopedics;  Laterality: Right;  . ANTERIOR CERVICAL CORPECTOMY N/A 11/25/2015   Procedure: ANTERIOR CERVICAL FIVE CORPECTOMY Cervical four - six fusion;  Surgeon: Consuella Lose, MD;  Location: [facility]UR[facility]S;  Service: Neurosurgery;  Laterality: N/A;  ANTERIOR CERVICAL FIVE CORPECTOMY Cervical four - six fusion  . POSTERIOR CERVICAL FUSION/FORAMINOTOMY N/A 11/29/2015   Procedure: Cervical Three-Cervical Seven Posterior Cervical Laminectomy with  Fusion;  Surgeon: Consuella Lose, MD;  Location: [facility];  Service: Neurosurgery;  Laterality: N/A;  Cervical Three-Cervical Seven Posterior Cervical Laminectomy with Fusion   Social History   Occupational History  . Not on file  Tobacco Use  . Smoking status: Former Smoker    Packs/day: 3.00    Types: Cigarettes  . Smokeless tobacco: Never Used  Substance and Sexual Activity  . Alcohol use: No  . Drug use: No  . Sexual activity: Not on file

## 2017-07-08 ENCOUNTER — Other Ambulatory Visit (INDEPENDENT_AMBULATORY_CARE_PROVIDER_SITE_OTHER): Payer: Self-pay | Admitting: Family

## 2017-07-08 MED ORDER — PREGABALIN 300 MG PO CAPS: 300.0000 mg | ORAL_CAPSULE | Freq: Two times a day (BID) | ORAL | 0 refills | Status: DC

## 2017-07-09 ENCOUNTER — Telehealth (INDEPENDENT_AMBULATORY_CARE_PROVIDER_SITE_OTHER): Payer: Self-pay

## 2017-07-09 NOTE — Telephone Encounter (Signed)
-----   Message from Suzan Slick, NP sent at 07/08/2017  4:19 PM EST ----- Can we try to PA Lyrica  Although I cant see where hes taken gabapentin or cymbalta in past

## 2017-07-10 ENCOUNTER — Other Ambulatory Visit (INDEPENDENT_AMBULATORY_CARE_PROVIDER_SITE_OTHER): Payer: Self-pay | Admitting: Family

## 2017-07-18 DIAGNOSIS — R590 Localized enlarged lymph nodes: Secondary | ICD-10-CM | POA: Insufficient documentation

## 2017-08-05 ENCOUNTER — Encounter (INDEPENDENT_AMBULATORY_CARE_PROVIDER_SITE_OTHER): Payer: Self-pay | Admitting: Orthopedic Surgery

## 2017-08-05 ENCOUNTER — Ambulatory Visit (INDEPENDENT_AMBULATORY_CARE_PROVIDER_SITE_OTHER): Payer: Medicaid Other | Admitting: Orthopedic Surgery

## 2017-08-05 ENCOUNTER — Ambulatory Visit (INDEPENDENT_AMBULATORY_CARE_PROVIDER_SITE_OTHER): Payer: Medicaid Other

## 2017-08-05 DIAGNOSIS — E1149 Type 2 diabetes mellitus with other diabetic neurological complication: Secondary | ICD-10-CM | POA: Diagnosis not present

## 2017-08-05 DIAGNOSIS — Z89511 Acquired absence of right leg below knee: Secondary | ICD-10-CM

## 2017-08-05 DIAGNOSIS — E08621 Diabetes mellitus due to underlying condition with foot ulcer: Secondary | ICD-10-CM | POA: Diagnosis not present

## 2017-08-05 DIAGNOSIS — L97522 Non-pressure chronic ulcer of other part of left foot with fat layer exposed: Secondary | ICD-10-CM

## 2017-08-05 DIAGNOSIS — M86272 Subacute osteomyelitis, left ankle and foot: Secondary | ICD-10-CM | POA: Diagnosis not present

## 2017-08-05 MED ORDER — DOXYCYCLINE HYCLATE 100 MG PO TABS: 100.0000 mg | ORAL_TABLET | Freq: Two times a day (BID) | ORAL | 0 refills | Status: DC

## 2017-08-05 NOTE — Progress Notes (Signed)
Office Visit Note   Patient: Gregory [name]           Date of Birth: [birth year]           MRN: [id number] Visit Date: 08/05/2017              Requested by: No referring provider defined for this encounter. PCP: Patient, No Pcp Per  Chief Complaint  Patient presents with  . Right Leg - Follow-up, Wound Check    S/p BKA rt.  . Left Foot - Follow-up, Edema, Wound Check      HPI: Patient presents in follow-up with ulceration swelling left second toe and 2 persistent ulcers of the right transtibial amputation. Is doing silvadene dressing changes.   Has completed a course of Doxycycline.  Assessment & Plan: Visit Diagnoses:  1. Diabetic ulcer of toe of left foot associated with diabetes mellitus due to underlying condition, with fat layer exposed ([place])   2. Acquired absence of right leg below knee (H[facility])   3. Type II diabetes mellitus with neurological manifestations ([place])     Plan: The right transtibial amputation ulcers have healed well.  the swelling and redness of the second toe of the left foot is stable. Will have him continue his Doxycycline. Discussed amputation of the second toe left foot for osteomyelitis. Patient would like to continue with conservative measures at this time. Will follow up with [name] in 4 weeks.  Follow-Up Instructions: No Follow-up on file.   Ortho Exam  Patient is alert, oriented, no adenopathy, well-dressed, normal affect, normal respiratory effort. Examination patient had wounds medially and laterally of the right transtibial amputation. These are well healed. there is no drainage no cellulitis.  Left foot he has mild erythema and continued sausage digit swelling of the second toe. There an open ulcer beneath nail plate, is 3 mm in diameter and 1 mm deep. Does not probe to bone. No drainage.   Imaging: No results found. No images are attached to the encounter.  Labs: Lab Results  Component Value Date   HGBA1C 7.2 (H) 03/25/2017   HGBA1C 5.6  08/14/2016   HGBA1C 5.5 01/18/2016   ESRSEDRATE 120 (H) 03/25/2017   ESRSEDRATE 84 (H) 10/02/2016   ESRSEDRATE 132 (H) 12/01/2015   CRP 4.9 (H) 03/25/2017   CRP 30.6 (H) 10/02/2016   CRP 13.7 (H) 11/11/2015   REPTSTATUS 03/30/2017 FINAL 03/25/2017   GRAMSTAIN  11/11/2015    ABUNDANT WBC PRESENT,BOTH PMN AND MONONUCLEAR RARE SQUAMOUS EPITHELIAL CELLS PRESENT FEW GRAM POSITIVE COCCI IN PAIRS RARE GRAM POSITIVE RODS RARE GRAM NEGATIVE COCCOBACILLI    CULT NO GROWTH 5 DAYS 03/25/2017   LABORGA PROVIDENCIA [name] (A) 01/12/2017    @LABSALLVALUES (HGBA1)@  There is no height or weight on file to calculate BMI.  Orders:  Orders Placed This Encounter  Procedures  . XR Toe 2nd Left   No orders of the defined types were placed in this encounter.    Procedures: No procedures performed  Clinical Data: No additional findings.  ROS:  All other systems negative, except as noted in the HPI. Review of Systems  Constitutional: Negative for chills and fever.  Skin: Positive for color change and wound.    Objective: Vital Signs: There were no vitals taken for this visit.  Specialty Comments:  No specialty comments available.  PMFS History: Patient Active Problem List   Diagnosis Date Noted  . Acquired absence of right leg below knee ([place]) 06/20/2017  . Osteomyelitis ([place]) 03/25/2017  .  Skin cancer 03/25/2017  . Depression, recurrent ([place]) 11/05/2016  . Diabetic ulcer of toe of left foot ([place]) 10/02/2016  . 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 Chronic pain 09/27/2016  . Dyslipidemia associated with type 2 diabetes mellitus ([place]) 06/06/2016  . GERD without esophagitis 05/11/2016  . Hypertensive heart disease with congestive heart failure ([place]) 12/15/2015  . Spinal stenosis in cervical region 12/15/2015  . Type II diabetes mellitus with neurological manifestations ([place]) 12/15/2015  . Chronic diastolic CHF (congestive heart failure) ([place])   . Pulmonary hypertension ([place])   .  Urinary retention   . Foot drop, bilateral   . MRSA bacteremia 10/03/2015  . COPD (chronic obstructive pulmonary disease) ([place]) 10/03/2015   Past Medical History:  Diagnosis Date  . Acute osteomyelitis of calcaneum, right ([place]) 10/02/2016  . Acute respiratory failure ([place])   . Anemia   . Basal cell carcinoma, eyelid   . Cancer ([place])   . Chronic back pain   . COPD (chronic obstructive pulmonary disease) ([place])   . DDD (degenerative disc disease), lumbar   . Diabetes mellitus without complication ([place])   . Diabetic ulcer of toe of left foot ([place]) 10/02/2016  . 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 Foot drop, bilateral   . Foot drop, right 12/27/2015  . Hyperlipidemia   . Hypertension   . MRSA bacteremia   . Osteomyelitis ([place])   . Osteomyelitis ([place])   . Urinary retention   . Urinary retention     Family History  Problem Relation Age of Onset  . Cancer Other   . Diabetes Other     Past Surgical History:  Procedure Laterality Date  . AMPUTATION Left 03/29/2017   Procedure: LEFT GREAT TOE AMPUTATION AT METATARSOPHALANGEAL JOINT;  Surgeon: [name], MD;  Location: [facility];  Service: Orthopedics;  Laterality: Left;  . AMPUTATION Right 03/29/2017   Procedure: RIGHT BELOW KNEE AMPUTATION;  Surgeon: [name], MD;  Location: [facility];  Service: Orthopedics;  Laterality: Right;  . ANTERIOR CERVICAL CORPECTOMY N/A 11/25/2015   Procedure: ANTERIOR CERVICAL FIVE CORPECTOMY Cervical four - six fusion;  Surgeon: Consuella Lose, MD;  Location: [facility];  Service: Neurosurgery;  Laterality: N/A;  ANTERIOR CERVICAL FIVE CORPECTOMY Cervical four - six fusion  . POSTERIOR CERVICAL FUSION/FORAMINOTOMY N/A 11/29/2015   Procedure: Cervical Three-Cervical Seven Posterior Cervical Laminectomy with Fusion;  Surgeon: Consuella Lose, MD;  Location: [facility];  Service: Neurosurgery;  Laterality: N/A;  Cervical Three-Cervical Seven Posterior Cervical Laminectomy with Fusion   Social History    Occupational History  . Not on file  Tobacco Use  . Smoking status: Former Smoker    Packs/day: 3.00    Types: Cigarettes  . Smokeless tobacco: Never Used  Substance and Sexual Activity  . Alcohol use: No  . Drug use: No  . Sexual activity: Not on file

## 2017-09-02 ENCOUNTER — Ambulatory Visit (INDEPENDENT_AMBULATORY_CARE_PROVIDER_SITE_OTHER): Payer: Medicaid Other | Admitting: Orthopedic Surgery

## 2017-10-15 ENCOUNTER — Encounter (INDEPENDENT_AMBULATORY_CARE_PROVIDER_SITE_OTHER): Payer: Self-pay | Admitting: Orthopedic Surgery

## 2017-10-15 ENCOUNTER — Ambulatory Visit (INDEPENDENT_AMBULATORY_CARE_PROVIDER_SITE_OTHER): Payer: Medicaid Other | Admitting: Orthopedic Surgery

## 2017-10-15 ENCOUNTER — Telehealth (INDEPENDENT_AMBULATORY_CARE_PROVIDER_SITE_OTHER): Payer: Self-pay | Admitting: Orthopedic Surgery

## 2017-10-15 DIAGNOSIS — Z89511 Acquired absence of right leg below knee: Secondary | ICD-10-CM | POA: Diagnosis not present

## 2017-10-15 DIAGNOSIS — L97522 Non-pressure chronic ulcer of other part of left foot with fat layer exposed: Secondary | ICD-10-CM

## 2017-10-15 DIAGNOSIS — E08621 Diabetes mellitus due to underlying condition with foot ulcer: Secondary | ICD-10-CM | POA: Diagnosis not present

## 2017-10-15 DIAGNOSIS — E1149 Type 2 diabetes mellitus with other diabetic neurological complication: Secondary | ICD-10-CM

## 2017-10-15 NOTE — Progress Notes (Signed)
Office Visit Note   Patient: Gregory [name]           Date of Birth: 1960-12-30           MRN: [id number] Visit Date: 10/15/2017              Requested by: No referring provider defined for this encounter. PCP: Patient, No Pcp Per  Chief Complaint  Patient presents with  . Left Foot - Follow-up, Pain, Edema  . Right Leg - Follow-up, Pain      HPI: Patient is a 57 year old gentleman who presents about 6 months status post right transtibial amputation he is status post left great toe amputation with cellulitis and osteomyelitis of the left foot second toe with venous stasis swelling in the left leg.  Patient is currently on doxycycline.  Assessment & Plan: Visit Diagnoses:  1. Diabetic ulcer of toe of left foot associated with diabetes mellitus due to underlying condition, with fat layer exposed ([place])   2. Acquired absence of right leg below knee (H[facility])   3. Type II diabetes mellitus with neurological manifestations ([place])     Plan: Recommended patient start with prosthetic fitting for the right lower extremity BKA to level ambulator prescription provided for [facility] for stump shrinker and prosthesis.  Patient needs 15 to 20 mm of compression for the left lower extremity.  Patient is not interested in any intervention for the second toe.  Follow-Up Instructions: Return in about 1 month (around 11/12/2017).   Ortho Exam  Patient is alert, oriented, no adenopathy, well-dressed, normal affect, normal respiratory effort. Examination patient has a well-healed transtibial amputation the right there is a small wound that is 2 x 2 mm and 1 mm deep with bloody drainage.  Patient is rubbing the open wound with his fingers.  There is no cellulitis no odor no purulence no signs of infection.  CMS left lower extremity has venous stasis swelling with pitting edema venous stasis ulcers there is swelling and redness of the second toe consistent with chronic osteomyelitis but no open wounds no  drainage.  Imaging: No results found. No images are attached to the encounter.  Labs: Lab Results  Component Value Date   HGBA1C 7.2 (H) 03/25/2017   HGBA1C 5.6 08/14/2016   HGBA1C 5.5 01/18/2016   ESRSEDRATE 120 (H) 03/25/2017   ESRSEDRATE 84 (H) 10/02/2016   ESRSEDRATE 132 (H) 12/01/2015   CRP 4.9 (H) 03/25/2017   CRP 30.6 (H) 10/02/2016   CRP 13.7 (H) 11/11/2015   REPTSTATUS 03/30/2017 FINAL 03/25/2017   GRAMSTAIN  11/11/2015    ABUNDANT WBC PRESENT,BOTH PMN AND MONONUCLEAR RARE SQUAMOUS EPITHELIAL CELLS PRESENT FEW GRAM POSITIVE COCCI IN PAIRS RARE GRAM POSITIVE RODS RARE GRAM NEGATIVE COCCOBACILLI    CULT NO GROWTH 5 DAYS 03/25/2017   LABORGA PROVIDENCIA [name] (A) 01/12/2017    @LABSALLVALUES (HGBA1)@  There is no height or weight on file to calculate BMI.  Orders:  No orders of the defined types were placed in this encounter.  No orders of the defined types were placed in this encounter.    Procedures: No procedures performed  Clinical Data: No additional findings.  ROS:  All other systems negative, except as noted in the HPI. Review of Systems  Objective: Vital Signs: There were no vitals taken for this visit.  Specialty Comments:  No specialty comments available.  PMFS History: Patient Active Problem List   Diagnosis Date Noted  . Acquired absence of right leg below knee ([place]) 06/20/2017  .  Osteomyelitis ([place]) 03/25/2017  . Skin cancer 03/25/2017  . Depression, recurrent ([place]) 11/05/2016  . Diabetic ulcer of toe of left foot ([place]) 10/02/2016  . 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 Chronic pain 09/27/2016  . Dyslipidemia associated with type 2 diabetes mellitus ([place]) 06/06/2016  . GERD without esophagitis 05/11/2016  . Hypertensive heart disease with congestive heart failure ([place]) 12/15/2015  . Spinal stenosis in cervical region 12/15/2015  . Type II diabetes mellitus with neurological manifestations ([place]) 12/15/2015  . Chronic diastolic CHF  (congestive heart failure) ([place])   . Pulmonary hypertension ([place])   . Urinary retention   . Foot drop, bilateral   . MRSA bacteremia 10/03/2015  . COPD (chronic obstructive pulmonary disease) ([place]) 10/03/2015   Past Medical History:  Diagnosis Date  . Acute osteomyelitis of calcaneum, right ([place]) 10/02/2016  . Acute respiratory failure ([place])   . Anemia   . Basal cell carcinoma, eyelid   . Cancer ([place])   . Chronic back pain   . COPD (chronic obstructive pulmonary disease) ([name])   . DDD (degenerative disc disease), lumbar   . Diabetes mellitus without complication ([place])   . Diabetic ulcer of toe of left foot ([place]) 10/02/2016  . 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 Foot drop, bilateral   . Foot drop, right 12/27/2015  . Hyperlipidemia   . Hypertension   . MRSA bacteremia   . Osteomyelitis ([place])   . Osteomyelitis ([place])   . Urinary retention   . Urinary retention     Family History  Problem Relation Age of Onset  . Cancer Other   . Diabetes Other     Past Surgical History:  Procedure Laterality Date  . AMPUTATION Left 03/29/2017   Procedure: LEFT GREAT TOE AMPUTATION AT METATARSOPHALANGEAL JOINT;  Surgeon: [name], MD;  Location: [facility];  Service: Orthopedics;  Laterality: Left;  . AMPUTATION Right 03/29/2017   Procedure: RIGHT BELOW KNEE AMPUTATION;  Surgeon: [name], MD;  Location: [facility];  Service: Orthopedics;  Laterality: Right;  . ANTERIOR CERVICAL CORPECTOMY N/A 11/25/2015   Procedure: ANTERIOR CERVICAL FIVE CORPECTOMY Cervical four - six fusion;  Surgeon: Consuella Lose, MD;  Location: [facility];  Service: Neurosurgery;  Laterality: N/A;  ANTERIOR CERVICAL FIVE CORPECTOMY Cervical four - six fusion  . POSTERIOR CERVICAL FUSION/FORAMINOTOMY N/A 11/29/2015   Procedure: Cervical Three-Cervical Seven Posterior Cervical Laminectomy with Fusion;  Surgeon: Consuella Lose, MD;  Location: [facility];  Service: Neurosurgery;  Laterality: N/A;  Cervical Three-Cervical  Seven Posterior Cervical Laminectomy with Fusion   Social History   Occupational History  . Not on file  Tobacco Use  . Smoking status: Former Smoker    Packs/day: 3.00    Types: Cigarettes  . Smokeless tobacco: Never Used  Substance and Sexual Activity  . Alcohol use: No  . Drug use: No  . Sexual activity: Not on file

## 2017-10-15 NOTE — Telephone Encounter (Signed)
I called and sw [place] and she wanted a stop date for the pt's doxy. sw [name] and he advised that it is ok to stop the doxycycline today. She will call with questions.

## 2017-10-15 NOTE — Telephone Encounter (Signed)
[place] from [employer] called stating that she had some questions in regards to the patient taking Doxycycline.  CB#[phone].  Thank you.

## 2017-11-14 ENCOUNTER — Encounter (INDEPENDENT_AMBULATORY_CARE_PROVIDER_SITE_OTHER): Payer: Self-pay | Admitting: Orthopedic Surgery

## 2017-11-14 ENCOUNTER — Ambulatory Visit (INDEPENDENT_AMBULATORY_CARE_PROVIDER_SITE_OTHER): Payer: Medicaid Other | Admitting: Orthopedic Surgery

## 2017-11-14 VITALS — Ht 72.0 in | Wt 220.0 lb

## 2017-11-14 DIAGNOSIS — Z89412 Acquired absence of left great toe: Secondary | ICD-10-CM

## 2017-11-14 DIAGNOSIS — E08621 Diabetes mellitus due to underlying condition with foot ulcer: Secondary | ICD-10-CM

## 2017-11-14 DIAGNOSIS — IMO0002 Reserved for concepts with insufficient information to code with codable children: Secondary | ICD-10-CM | POA: Insufficient documentation

## 2017-11-14 DIAGNOSIS — L97522 Non-pressure chronic ulcer of other part of left foot with fat layer exposed: Secondary | ICD-10-CM | POA: Diagnosis not present

## 2017-11-14 DIAGNOSIS — Z89511 Acquired absence of right leg below knee: Secondary | ICD-10-CM

## 2017-11-14 DIAGNOSIS — L97929 Non-pressure chronic ulcer of unspecified part of left lower leg with unspecified severity: Secondary | ICD-10-CM | POA: Diagnosis not present

## 2017-11-14 DIAGNOSIS — I87332 Chronic venous hypertension (idiopathic) with ulcer and inflammation of left lower extremity: Secondary | ICD-10-CM | POA: Diagnosis not present

## 2017-11-14 NOTE — Progress Notes (Signed)
Office Visit Note   Patient: Gregory [name]           Date of Birth: Dec 31, 1960           MRN: [id number] Visit Date: 11/14/2017              Requested by: No referring provider defined for this encounter. PCP: Patient, No Pcp Per  Chief Complaint  Patient presents with  . Left Foot - Follow-up    03/29/17 Left foot great toe amputation at MTP joint   . Right Leg - Follow-up    03/2017 [facility]- [facility]       HPI: Patient is a 57 year old gentleman currently resident at skilled nursing status post left great toe amputation status post right transtibial amputation who has venous stasis ulcers on the left leg and a diabetic ulcer on the dorsum of the second toe left foot.  Patient is currently not wearing his shrinker he does complain of phantom pain he is on Lyrica 300 mg twice a day.  Assessment & Plan: Visit Diagnoses:  1. Diabetic ulcer of toe of left foot associated with diabetes mellitus due to underlying condition, with fat layer exposed ([place])   2. Acquired absence of right leg below knee (HCC)   3. Great toe amputation status, left ([place])   4. 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  He was given a prescription for a 15 to 20 mm compression knee-high stocking for the left lower extremity to be worn around-the-clock.  Patient was given instructions for Achilles stretching.  Discussed that he should pursue the prosthesis instead of using a scooter.  Follow-Up Instructions: Return in about 1 month (around 12/12/2017).   Ortho Exam  Patient is alert, oriented, no adenopathy, well-dressed, normal affect, normal respiratory effort. Examination patient is currently ambulating in a wheelchair.  He has venous stasis swelling in the left lower  extremity with pitting edema up to the tibial tubercle he has venous stasis ulcers over the anterior aspect of the tibia.  He has fixed clawing of his toes and has a ulcer on the dorsum of the second toe which is 3 mm in diameter 1 mm deep with fibrinous tissue there is no cellulitis no drainage no odor no signs of infection.  Patient has a well consolidated right transtibial amputation.  Imaging: No results found. No images are attached to the encounter.  Labs: Lab Results  Component Value Date   HGBA1C 7.2 (H) 03/25/2017   HGBA1C 5.6 08/14/2016   HGBA1C 5.5 01/18/2016   ESRSEDRATE 120 (H) 03/25/2017   ESRSEDRATE 84 (H) 10/02/2016   ESRSEDRATE 132 (H) 12/01/2015   CRP 4.9 (H) 03/25/2017   CRP 30.6 (H) 10/02/2016   CRP 13.7 (H) 11/11/2015   REPTSTATUS 03/30/2017 FINAL 03/25/2017   GRAMSTAIN  11/11/2015    ABUNDANT WBC PRESENT,BOTH PMN AND MONONUCLEAR RARE SQUAMOUS EPITHELIAL CELLS PRESENT FEW GRAM POSITIVE COCCI IN PAIRS RARE GRAM POSITIVE RODS RARE GRAM NEGATIVE COCCOBACILLI    CULT NO GROWTH 5 DAYS 03/25/2017   LABORGA PROVIDENCIA [name] (A) 01/12/2017     Lab Results  Component Value Date   ALBUMIN 3.2 (L) 03/28/2017   ALBUMIN 3.2 (L) 03/25/2017   ALBUMIN 3.7 01/12/2017   PREALBUMIN 23.3 03/25/2017    Body mass  index is 29.84 kg/m.  Orders:  No orders of the defined types were placed in this encounter.  No orders of the defined types were placed in this encounter.    Procedures: No procedures performed  Clinical Data: No additional findings.  ROS:  All other systems negative, except as noted in the HPI. Review of Systems  Objective: Vital Signs: Ht 6' (1.829 m)   Wt 220 lb (99.8 kg)   BMI 29.84 kg/m   Specialty Comments:  No specialty comments available.  PMFS History: Patient Active Problem List   Diagnosis Date Noted  . 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 Great toe  amputation status, left ([place]) 11/14/2017  . Acquired absence of right leg below knee ([place]) 06/20/2017  . Osteomyelitis ([place]) 03/25/2017  . Skin cancer 03/25/2017  . Depression, recurrent ([place]) 11/05/2016  . Diabetic ulcer of toe of left foot ([place]) 10/02/2016  . 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 Chronic pain 09/27/2016  . Dyslipidemia associated with type 2 diabetes mellitus ([place]) 06/06/2016  . GERD without esophagitis 05/11/2016  . Hypertensive heart disease with congestive heart failure ([place]) 12/15/2015  . Spinal stenosis in cervical region 12/15/2015  . Type II diabetes mellitus with neurological manifestations ([place]) 12/15/2015  . Chronic diastolic CHF (congestive heart failure) ([place])   . Pulmonary hypertension ([place])   . Urinary retention   . Foot drop, bilateral   . MRSA bacteremia 10/03/2015  . COPD (chronic obstructive pulmonary disease) ([place]) 10/03/2015   Past Medical History:  Diagnosis Date  . Acute osteomyelitis of calcaneum, right ([place]) 10/02/2016  . Acute respiratory failure ([place])   . Anemia   . Basal cell carcinoma, eyelid   . Cancer ([place])   . Chronic back pain   . COPD (chronic obstructive pulmonary disease) ([place])   . DDD (degenerative disc disease), lumbar   . Diabetes mellitus without complication ([place])   . Diabetic ulcer of toe of left foot ([place]) 10/02/2016  . 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 Foot drop, bilateral   . Foot drop, right 12/27/2015  . Hyperlipidemia   . Hypertension   . MRSA bacteremia   . Osteomyelitis ([place])   . Osteomyelitis ([place])   . Urinary retention   . Urinary retention     Family History  Problem Relation Age of Onset  . Cancer Other   . Diabetes Other     Past Surgical History:  Procedure Laterality Date  . AMPUTATION Left 03/29/2017   Procedure: LEFT GREAT TOE AMPUTATION AT METATARSOPHALANGEAL JOINT;  Surgeon: [name], MD;  Location: [facility];  Service: Orthopedics;  Laterality: Left;  . AMPUTATION Right 03/29/2017   Procedure:  RIGHT BELOW KNEE AMPUTATION;  Surgeon: [name], MD;  Location: [facility];  Service: Orthopedics;  Laterality: Right;  . ANTERIOR CERVICAL CORPECTOMY N/A 11/25/2015   Procedure: ANTERIOR CERVICAL FIVE CORPECTOMY Cervical four - six fusion;  Surgeon: Consuella Lose, MD;  Location: [facility];  Service: Neurosurgery;  Laterality: N/A;  ANTERIOR CERVICAL FIVE CORPECTOMY Cervical four - six fusion  . POSTERIOR CERVICAL FUSION/FORAMINOTOMY N/A 11/29/2015   Procedure: Cervical Three-Cervical Seven Posterior Cervical Laminectomy with Fusion;  Surgeon: Consuella Lose, MD;  Location: [facility];  Service: Neurosurgery;  Laterality: N/A;  Cervical Three-Cervical Seven Posterior Cervical Laminectomy with Fusion   Social History   Occupational History  . Not on file  Tobacco Use  . Smoking status: Former Smoker    Packs/day: 3.00  Types: Cigarettes  . Smokeless tobacco: Never Used  Substance and Sexual Activity  . Alcohol use: No  . Drug use: No  . Sexual activity: Not on file

## 2017-12-05 LAB — HM DIABETES EYE EXAM

## 2017-12-12 ENCOUNTER — Ambulatory Visit (INDEPENDENT_AMBULATORY_CARE_PROVIDER_SITE_OTHER): Payer: Medicaid Other | Admitting: Orthopedic Surgery

## 2017-12-12 ENCOUNTER — Encounter (INDEPENDENT_AMBULATORY_CARE_PROVIDER_SITE_OTHER): Payer: Self-pay | Admitting: Orthopedic Surgery

## 2017-12-12 DIAGNOSIS — E08621 Diabetes mellitus due to underlying condition with foot ulcer: Secondary | ICD-10-CM

## 2017-12-12 DIAGNOSIS — Z89511 Acquired absence of right leg below knee: Secondary | ICD-10-CM | POA: Diagnosis not present

## 2017-12-12 DIAGNOSIS — L97522 Non-pressure chronic ulcer of other part of left foot with fat layer exposed: Secondary | ICD-10-CM

## 2017-12-12 NOTE — Progress Notes (Signed)
Office Visit Note   Patient: Gregory [name]           Date of Birth: 13-Mar-1961           MRN: [id number] Visit Date: 12/12/2017              Requested by: No referring provider defined for this encounter. PCP: Patient, No Pcp Per  Chief Complaint  Patient presents with  . Left Foot - Follow-up      HPI: Patient is a 57 year old gentleman status post right transtibial amputation and left great toe amputation who was started on doxycycline for sausage digit swelling of the left second toe.  Radiographs have not shown any destructive bony changes.  Patient is currently on oxycodone 15 mg every 6 hours and morphine 15 mg twice a day he requests more pain medication.  Assessment & Plan: Visit Diagnoses:  1. Diabetic ulcer of toe of left foot associated with diabetes mellitus due to underlying condition, with fat layer exposed ([place])   2. Acquired absence of right leg below knee (HCC)     Plan: Regarding the pain medication discussed the patient is at a dangerous dose of narcotics and I would recommend him weaning off the current dose that he is taking.  Patient is at risk for respiratory failure.  Recommend that he complete the oral antibiotics and reevaluate the second toe and follow-up discussed that with the clinical signs and his second toe may require amputation.  He will follow-up with biotech for prosthetic fitting on the right.  Follow-Up Instructions: Return in about 1 month (around 01/09/2018).   Ortho Exam  Patient is alert, oriented, no adenopathy, well-dressed, normal affect, normal respiratory effort. Examination patient has a well consolidated transtibial amputation on the right no complicating features.  Left foot the great toe amputation has healed nicely the second toe does have sausage digit swelling but there is no cellulitis.  The ulcer over the PIP joint is improved does not probe to bone no drainage no cellulitis no odor.  Imaging: No results found. No images are  attached to the encounter.  Labs: Lab Results  Component Value Date   HGBA1C 7.2 (H) 03/25/2017   HGBA1C 5.6 08/14/2016   HGBA1C 5.5 01/18/2016   ESRSEDRATE 120 (H) 03/25/2017   ESRSEDRATE 84 (H) 10/02/2016   ESRSEDRATE 132 (H) 12/01/2015   CRP 4.9 (H) 03/25/2017   CRP 30.6 (H) 10/02/2016   CRP 13.7 (H) 11/11/2015   REPTSTATUS 03/30/2017 FINAL 03/25/2017   GRAMSTAIN  11/11/2015    ABUNDANT WBC PRESENT,BOTH PMN AND MONONUCLEAR RARE SQUAMOUS EPITHELIAL CELLS PRESENT FEW GRAM POSITIVE COCCI IN PAIRS RARE GRAM POSITIVE RODS RARE GRAM NEGATIVE COCCOBACILLI    CULT NO GROWTH 5 DAYS 03/25/2017   LABORGA PROVIDENCIA [name] (A) 01/12/2017     Lab Results  Component Value Date   ALBUMIN 3.2 (L) 03/28/2017   ALBUMIN 3.2 (L) 03/25/2017   ALBUMIN 3.7 01/12/2017   PREALBUMIN 23.3 03/25/2017    There is no height or weight on file to calculate BMI.  Orders:  No orders of the defined types were placed in this encounter.  No orders of the defined types were placed in this encounter.    Procedures: No procedures performed  Clinical Data: No additional findings.  ROS:  All other systems negative, except as noted in the HPI. Review of Systems  Objective: Vital Signs: There were no vitals taken for this visit.  Specialty Comments:  No specialty comments available.  Nina  History: Patient Active Problem List   Diagnosis Date Noted  . 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 Great toe amputation status, left ([place]) 11/14/2017  . Acquired absence of right leg below knee ([place]) 06/20/2017  . Osteomyelitis ([place]) 03/25/2017  . Skin cancer 03/25/2017  . Depression, recurrent ([place]) 11/05/2016  . Diabetic ulcer of toe of left foot ([place]) 10/02/2016  . 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 Chronic pain 09/27/2016  . Dyslipidemia associated with type 2 diabetes mellitus ([place]) 06/06/2016  . GERD without esophagitis  05/11/2016  . Hypertensive heart disease with congestive heart failure ([place]) 12/15/2015  . Spinal stenosis in cervical region 12/15/2015  . Type II diabetes mellitus with neurological manifestations ([place]) 12/15/2015  . Chronic diastolic CHF (congestive heart failure) ([place])   . Pulmonary hypertension ([place])   . Urinary retention   . Foot drop, bilateral   . MRSA bacteremia 10/03/2015  . COPD (chronic obstructive pulmonary disease) ([place]) 10/03/2015   Past Medical History:  Diagnosis Date  . Acute osteomyelitis of calcaneum, right ([place]) 10/02/2016  . Acute respiratory failure ([place])   . Anemia   . Basal cell carcinoma, eyelid   . Cancer ([place])   . Chronic back pain   . COPD (chronic obstructive pulmonary disease) ([place])   . DDD (degenerative disc disease), lumbar   . Diabetes mellitus without complication ([place])   . Diabetic ulcer of toe of left foot ([place]) 10/02/2016  . 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 Foot drop, bilateral   . Foot drop, right 12/27/2015  . Hyperlipidemia   . Hypertension   . MRSA bacteremia   . Osteomyelitis ([place])   . Osteomyelitis ([place])   . Urinary retention   . Urinary retention     Family History  Problem Relation Age of Onset  . Cancer Other   . Diabetes Other     Past Surgical History:  Procedure Laterality Date  . AMPUTATION Left 03/29/2017   Procedure: LEFT GREAT TOE AMPUTATION AT METATARSOPHALANGEAL JOINT;  Surgeon: [name], MD;  Location: [facility];  Service: Orthopedics;  Laterality: Left;  . AMPUTATION Right 03/29/2017   Procedure: RIGHT BELOW KNEE AMPUTATION;  Surgeon: [name], MD;  Location: [facility];  Service: Orthopedics;  Laterality: Right;  . ANTERIOR CERVICAL CORPECTOMY N/A 11/25/2015   Procedure: ANTERIOR CERVICAL FIVE CORPECTOMY Cervical four - six fusion;  Surgeon: Consuella Lose, MD;  Location: [facility];  Service: Neurosurgery;  Laterality: N/A;  ANTERIOR CERVICAL FIVE CORPECTOMY Cervical four - six fusion  . POSTERIOR CERVICAL  FUSION/FORAMINOTOMY N/A 11/29/2015   Procedure: Cervical Three-Cervical Seven Posterior Cervical Laminectomy with Fusion;  Surgeon: Consuella Lose, MD;  Location: [facility];  Service: Neurosurgery;  Laterality: N/A;  Cervical Three-Cervical Seven Posterior Cervical Laminectomy with Fusion   Social History   Occupational History  . Not on file  Tobacco Use  . Smoking status: Former Smoker    Packs/day: 3.00    Types: Cigarettes  . Smokeless tobacco: Never Used  Substance and Sexual Activity  . Alcohol use: No  . Drug use: No  . Sexual activity: Not on file

## 2018-01-09 ENCOUNTER — Ambulatory Visit (INDEPENDENT_AMBULATORY_CARE_PROVIDER_SITE_OTHER): Payer: Medicaid Other | Admitting: Orthopedic Surgery

## 2018-01-20 ENCOUNTER — Ambulatory Visit (INDEPENDENT_AMBULATORY_CARE_PROVIDER_SITE_OTHER): Payer: Medicaid Other | Admitting: Orthopedic Surgery

## 2018-01-28 ENCOUNTER — Ambulatory Visit (INDEPENDENT_AMBULATORY_CARE_PROVIDER_SITE_OTHER): Payer: Medicaid Other | Admitting: Orthopedic Surgery

## 2018-01-28 DIAGNOSIS — I87332 Chronic venous hypertension (idiopathic) with ulcer and inflammation of left lower extremity: Secondary | ICD-10-CM

## 2018-01-28 DIAGNOSIS — L97929 Non-pressure chronic ulcer of unspecified part of left lower leg with unspecified severity: Secondary | ICD-10-CM

## 2018-01-28 DIAGNOSIS — E08621 Diabetes mellitus due to underlying condition with foot ulcer: Secondary | ICD-10-CM | POA: Diagnosis not present

## 2018-01-28 DIAGNOSIS — Z89511 Acquired absence of right leg below knee: Secondary | ICD-10-CM | POA: Diagnosis not present

## 2018-01-28 DIAGNOSIS — L97522 Non-pressure chronic ulcer of other part of left foot with fat layer exposed: Secondary | ICD-10-CM | POA: Diagnosis not present

## 2018-02-03 ENCOUNTER — Encounter (INDEPENDENT_AMBULATORY_CARE_PROVIDER_SITE_OTHER): Payer: Self-pay | Admitting: Orthopedic Surgery

## 2018-02-03 NOTE — Progress Notes (Signed)
Office Visit Note   Patient: Gregory [name]           Date of Birth: June 29, 1960           MRN: [id number] Visit Date: 01/28/2018              Requested by: No referring provider defined for this encounter. PCP: Patient, No Pcp Per  Chief Complaint  Patient presents with  . 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  Assessment & Plan: Visit Diagnoses:  1. Acquired absence of right leg below knee ([place])   2. Diabetic ulcer of toe of left foot associated with diabetes mellitus due to underlying condition, with fat layer exposed ([place])   3. 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  The residual limb recommended compression stockings for the left lower extremity.  Discussed that with the sausage digit swelling of the second toe we may need to consider surgical intervention.  Follow-Up Instructions: Return in about 4 weeks (around 02/25/2018).   Ortho Exam  Patient is alert, oriented, no adenopathy, well-dressed, normal affect, normal respiratory effort. Examination patient has an antalgic gait.  Right transtibial amputation has healed well patient has not been using a stump shrinker.  Recommended follow-up with biotech a prescription was provided to obtain his stump shrinker for the right lower extremity.  Left lower extremity there is brawny skin color changes but no venous ulcers no cellulitis no signs of infection.  Patient does have sausage digit swelling of the left foot second toe with ulceration dorsally over the PIP joint.  There is no draining no cellulitis.  Imaging: No results found. No images are attached to the  encounter.  Labs: Lab Results  Component Value Date   HGBA1C 7.2 (H) 03/25/2017   HGBA1C 5.6 08/14/2016   HGBA1C 5.5 01/18/2016   ESRSEDRATE 120 (H) 03/25/2017   ESRSEDRATE 84 (H) 10/02/2016   ESRSEDRATE 132 (H) 12/01/2015   CRP 4.9 (H) 03/25/2017   CRP 30.6 (H) 10/02/2016   CRP 13.7 (H) 11/11/2015   REPTSTATUS 03/30/2017 FINAL 03/25/2017   GRAMSTAIN  11/11/2015    ABUNDANT WBC PRESENT,BOTH PMN AND MONONUCLEAR RARE SQUAMOUS EPITHELIAL CELLS PRESENT FEW GRAM POSITIVE COCCI IN PAIRS RARE GRAM POSITIVE RODS RARE GRAM NEGATIVE COCCOBACILLI    CULT NO GROWTH 5 DAYS 03/25/2017   LABORGA PROVIDENCIA [name] (A) 01/12/2017     Lab Results  Component Value Date   ALBUMIN 3.2 (L) 03/28/2017   ALBUMIN 3.2 (L) 03/25/2017   ALBUMIN 3.7 01/12/2017   PREALBUMIN 23.3 03/25/2017    There is no height or weight on file to calculate BMI.  Orders:  No orders of the defined types were placed in this encounter.  No orders of the defined types were placed in this encounter.    Procedures: No procedures performed  Clinical Data: No additional findings.  ROS:  All other systems negative, except as noted in the HPI. Review of Systems  Objective: Vital Signs: There were no vitals taken for this visit.  Specialty Comments:  No specialty comments available.  PMFS History: Patient Active Problem List   Diagnosis Date Noted  . Idiopathic  chronic venous hypertension of left lower extremity with ulcer and inflammation ([place]) 11/14/2017  . Great toe amputation status, left ([place]) 11/14/2017  . Acquired absence of right leg below knee ([place]) 06/20/2017  . Osteomyelitis ([place]) 03/25/2017  . Skin cancer 03/25/2017  . Depression, recurrent ([place]) 11/05/2016  . Diabetic ulcer of toe of left foot ([place]) 10/02/2016  . 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 Chronic pain 09/27/2016  . Dyslipidemia associated with type 2 diabetes mellitus ([place]) 06/06/2016  . GERD without esophagitis 05/11/2016  .  Hypertensive heart disease with congestive heart failure ([place]) 12/15/2015  . Spinal stenosis in cervical region 12/15/2015  . Type II diabetes mellitus with neurological manifestations ([place]) 12/15/2015  . Chronic diastolic CHF (congestive heart failure) ([place])   . Pulmonary hypertension ([place])   . Urinary retention   . Foot drop, bilateral   . MRSA bacteremia 10/03/2015  . COPD (chronic obstructive pulmonary disease) ([place]) 10/03/2015   Past Medical History:  Diagnosis Date  . Acute osteomyelitis of calcaneum, right ([place]) 10/02/2016  . Acute respiratory failure ([place])   . Anemia   . Basal cell carcinoma, eyelid   . Cancer ([place])   . Chronic back pain   . COPD (chronic obstructive pulmonary disease) ([place])   . DDD (degenerative disc disease), lumbar   . Diabetes mellitus without complication ([place])   . Diabetic ulcer of toe of left foot ([place]) 10/02/2016  . 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 Foot drop, bilateral   . Foot drop, right 12/27/2015  . Hyperlipidemia   . Hypertension   . MRSA bacteremia   . Osteomyelitis ([place])   . Osteomyelitis ([place])   . Urinary retention   . Urinary retention     Family History  Problem Relation Age of Onset  . Cancer Other   . Diabetes Other     Past Surgical History:  Procedure Laterality Date  . AMPUTATION Left 03/29/2017   Procedure: LEFT GREAT TOE AMPUTATION AT METATARSOPHALANGEAL JOINT;  Surgeon: [name], MD;  Location: [facility];  Service: Orthopedics;  Laterality: Left;  . AMPUTATION Right 03/29/2017   Procedure: RIGHT BELOW KNEE AMPUTATION;  Surgeon: [name], MD;  Location: [facility];  Service: Orthopedics;  Laterality: Right;  . ANTERIOR CERVICAL CORPECTOMY N/A 11/25/2015   Procedure: ANTERIOR CERVICAL FIVE CORPECTOMY Cervical four - six fusion;  Surgeon: Consuella Lose, MD;  Location: [facility];  Service: Neurosurgery;  Laterality: N/A;  ANTERIOR CERVICAL FIVE CORPECTOMY Cervical four - six fusion  . POSTERIOR CERVICAL FUSION/FORAMINOTOMY  N/A 11/29/2015   Procedure: Cervical Three-Cervical Seven Posterior Cervical Laminectomy with Fusion;  Surgeon: Consuella Lose, MD;  Location: [facility];  Service: Neurosurgery;  Laterality: N/A;  Cervical Three-Cervical Seven Posterior Cervical Laminectomy with Fusion   Social History   Occupational History  . Not on file  Tobacco Use  . Smoking status: Former Smoker    Packs/day: 3.00    Types: Cigarettes  . Smokeless tobacco: Never Used  Substance and Sexual Activity  . Alcohol use: No  . Drug use: No  . Sexual activity: Not on file

## 2018-02-25 IMAGING — CR DG CHEST 1V PORT
1 series · 1 of 1 positions shown · non-contrast
Comparison: 11/07/2015.

CLINICAL DATA: Respiratory failure.  Shortness of breath.

EXAM:
PORTABLE CHEST 1 VIEW

[AP]
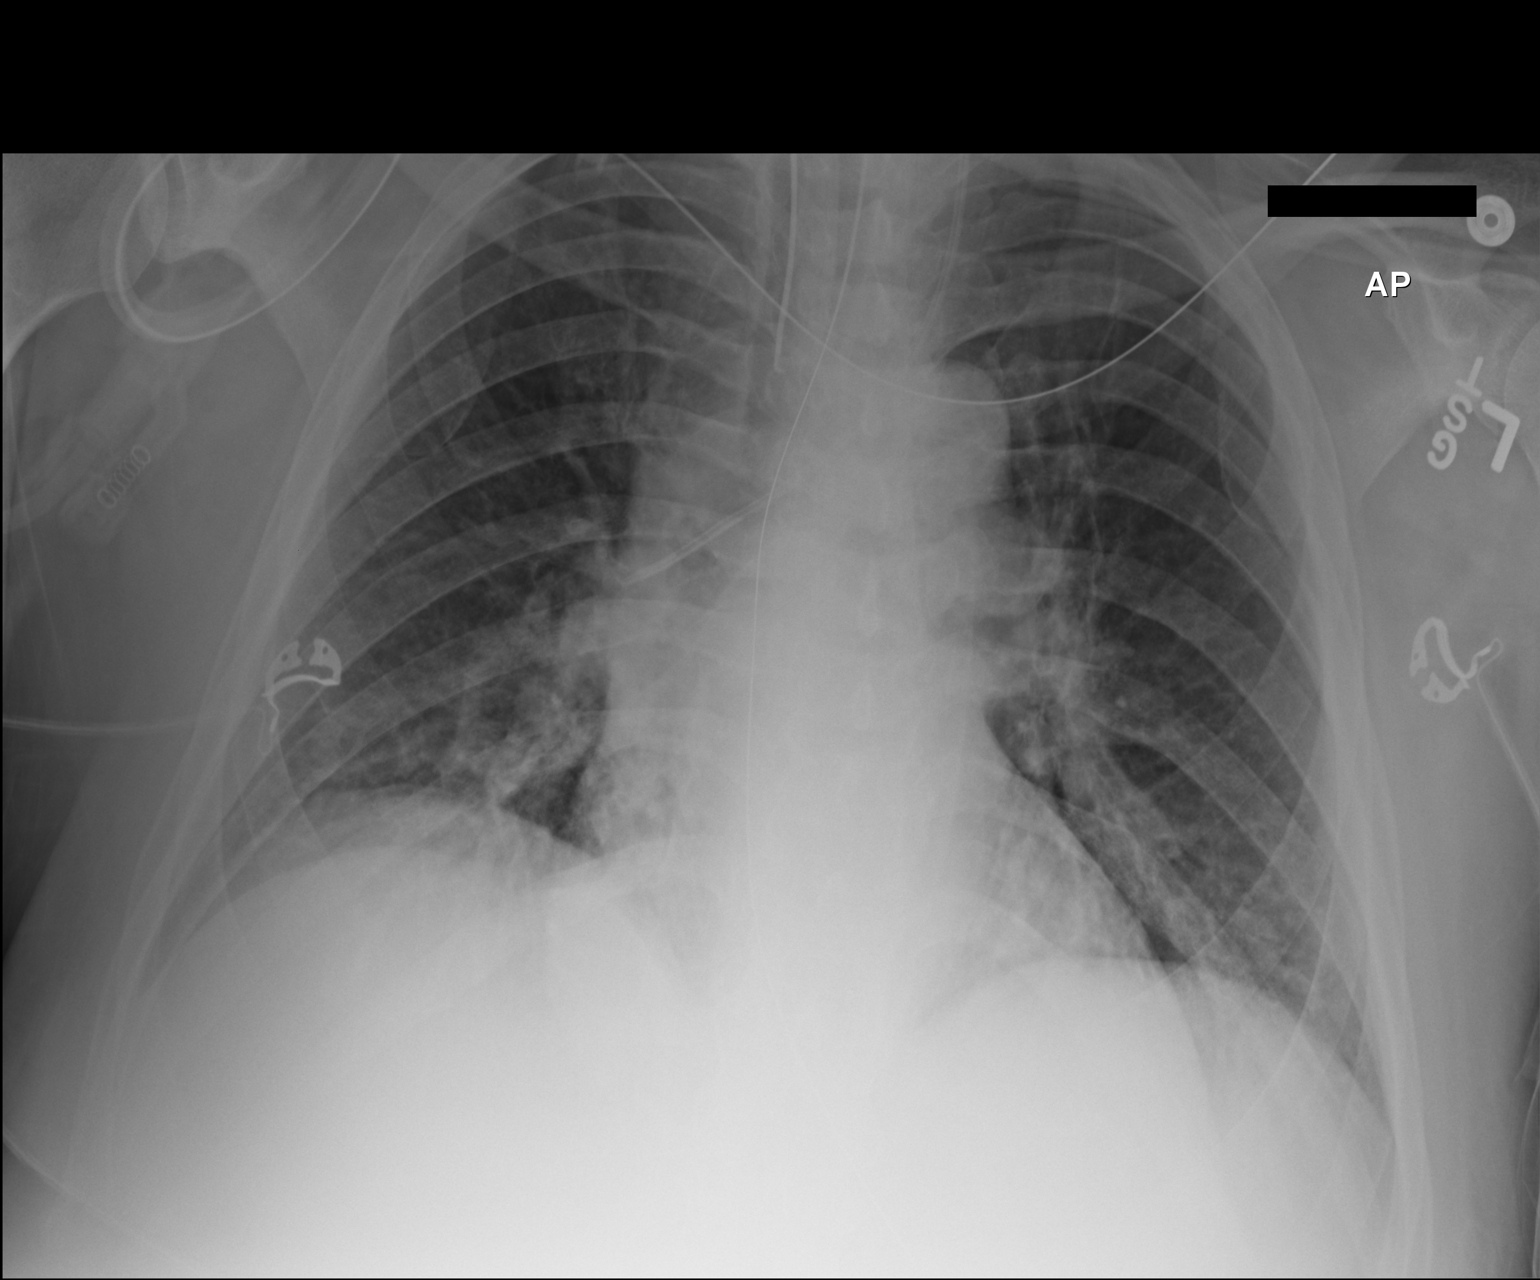

[1 of 1 positions shown; findings below may reference images not displayed]

FINDINGS: Endotracheal tube, left IJ line, NG tube in stable position. Heart
size normal. Low lung volumes with bibasilar atelectasis and/or
infiltrates. No pleural effusion or pneumothorax.
IMPRESSION: 1. Lines and tubes in stable position.

2.  Low lung volumes with bibasilar atelectasis and/or infiltrates.

## 2018-02-26 IMAGING — CR DG CHEST 1V PORT
1 series · 1 of 1 positions shown · non-contrast
Comparison: 11/08/2015.

CLINICAL DATA: Intubation.  Shortness of breath.

EXAM:
PORTABLE CHEST 1 VIEW

[AP]
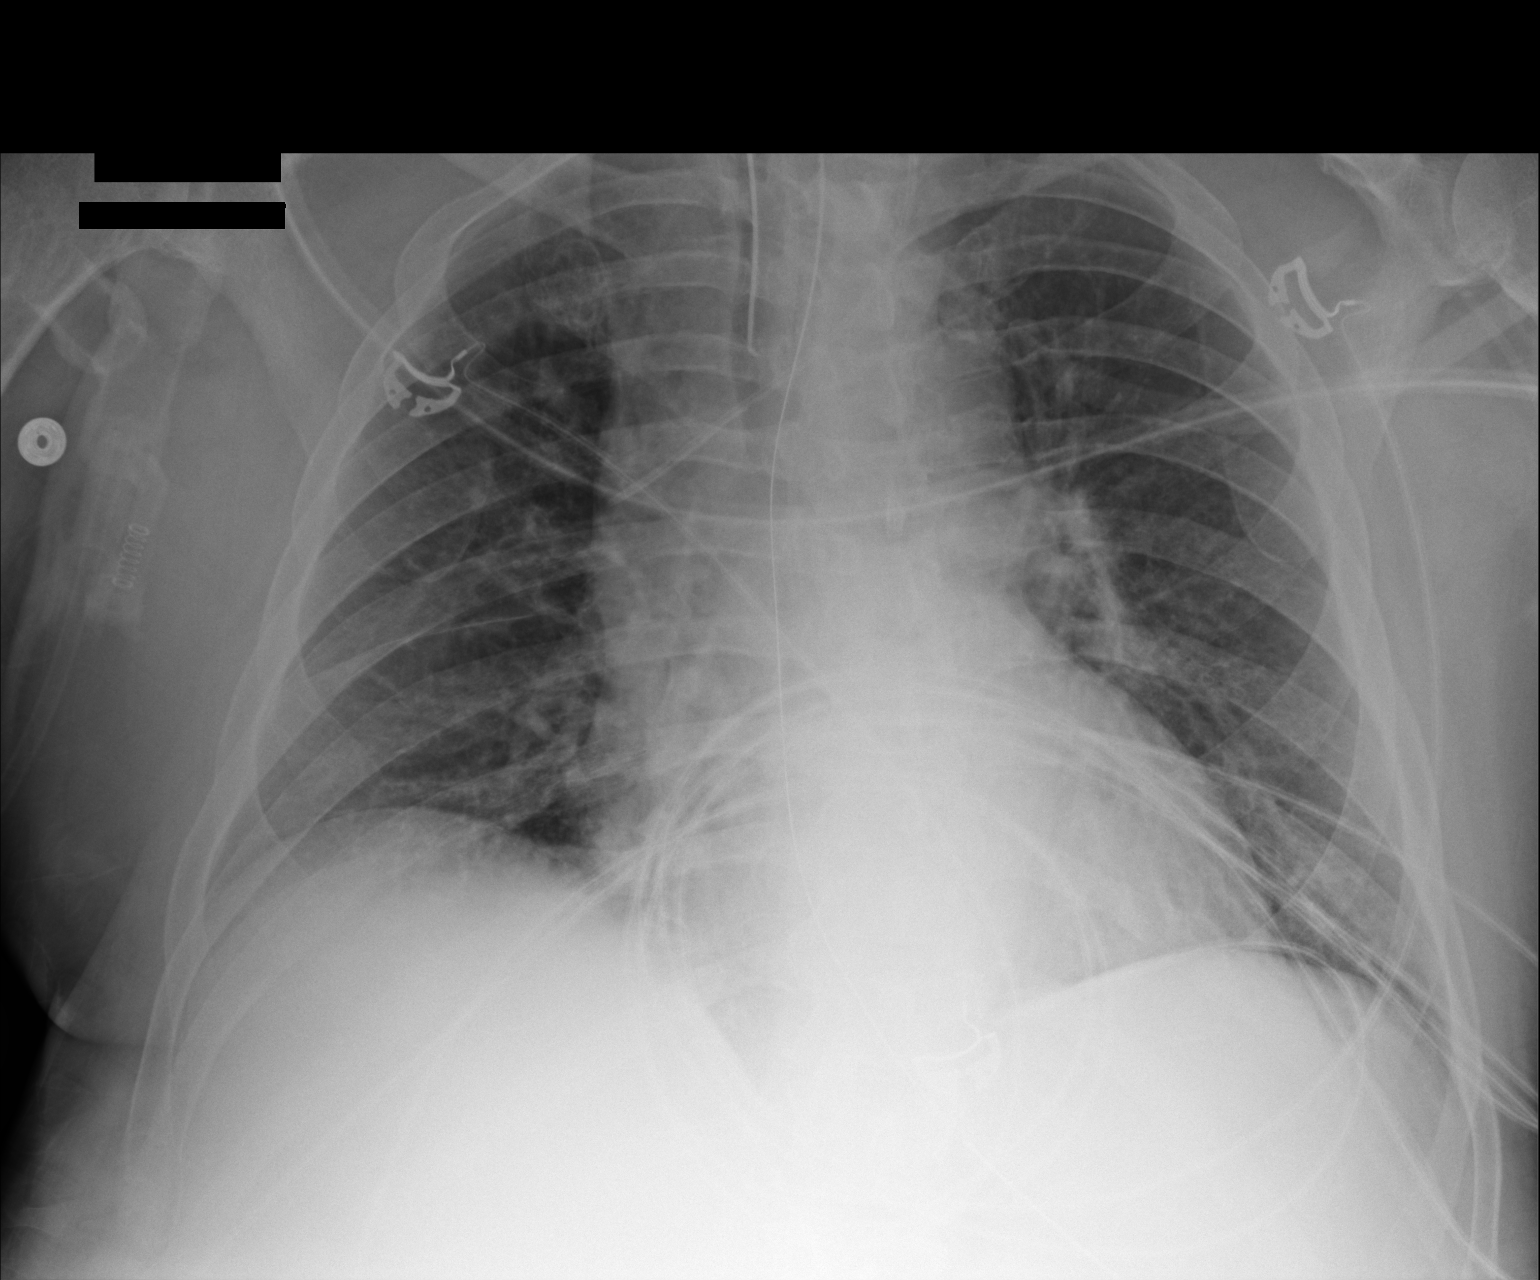

[1 of 1 positions shown; findings below may reference images not displayed]

FINDINGS: Endotracheal tube, left IJ line, NG tube in stable position. Mild
cardiomegaly, no pulmonary venous congestion. Low lung volumes with
mild bibasilar atelectasis. No pleural effusion or pneumothorax.
IMPRESSION: 1. Lines and tubes in stable position.
2. Low lung volumes with mild bibasilar atelectasis.
3. Mild cardiomegaly.  No pulmonary venous congestion.

## 2018-02-27 ENCOUNTER — Ambulatory Visit (INDEPENDENT_AMBULATORY_CARE_PROVIDER_SITE_OTHER): Payer: Medicaid Other | Admitting: Orthopedic Surgery

## 2018-02-27 IMAGING — DX DG ABD PORTABLE 1V
1 series · 1 of 1 positions shown · non-contrast
Comparison: None.

CLINICAL DATA: Orogastric tube placement

EXAM:
PORTABLE ABDOMEN - 1 VIEW

[abdomen supine]
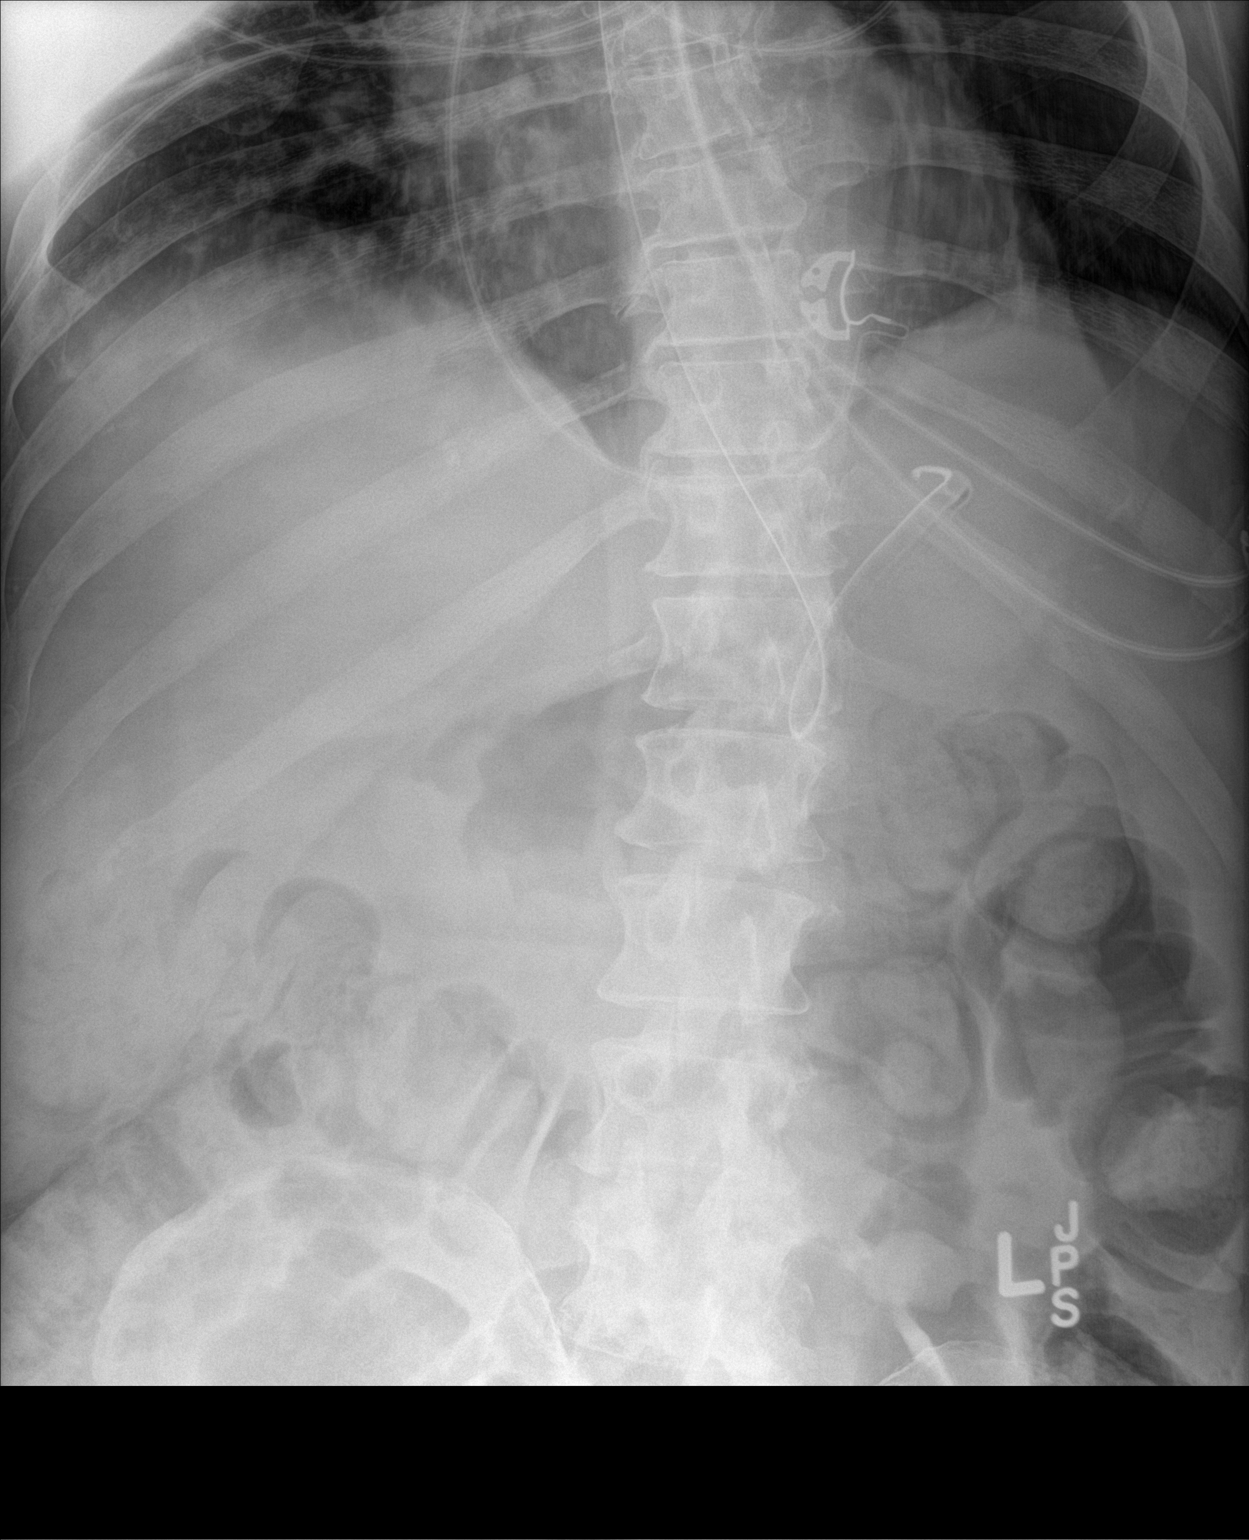

[1 of 1 positions shown; findings below may reference images not displayed]

FINDINGS: The there is normal small bowel gas pattern. There is NG tube coiled
within proximal stomach with tip in gastric fundus. Moderate gas and
stool noted within transverse colon and proximal left colon.
IMPRESSION: NG tube coiled within proximal stomach with tip in gastric fundus.
Moderate stool and gas noted in visualized colon.

## 2018-02-27 IMAGING — CT CT ABD-PELV W/O CM
2 of 4 series · 15 of 46 positions shown, 17 images · non-contrast
Comparison: CT of the abdomen and pelvis 08/01/2015.

CLINICAL DATA: 54-year-old male with history of urinary tract
infection. Ongoing fever. Evaluate for potential abscess.

EXAM:
CT ABDOMEN AND PELVIS WITHOUT CONTRAST
TECHNIQUE: Multidetector CT imaging of the abdomen and pelvis was performed
following the standard protocol without IV contrast.

[Series 2: a/p w/o 5mm · axial · non-contrast · 0.96mm/px · z∈[-552,-67]mm · 12 of 107 slices shown, 14 images]
[im 5/107  soft-tissue]
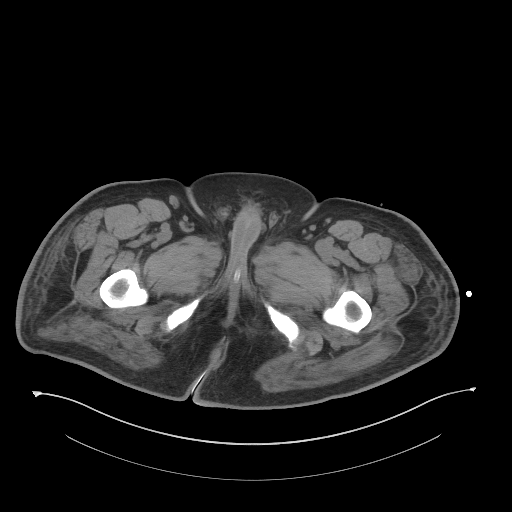
[im 5/107  bone]
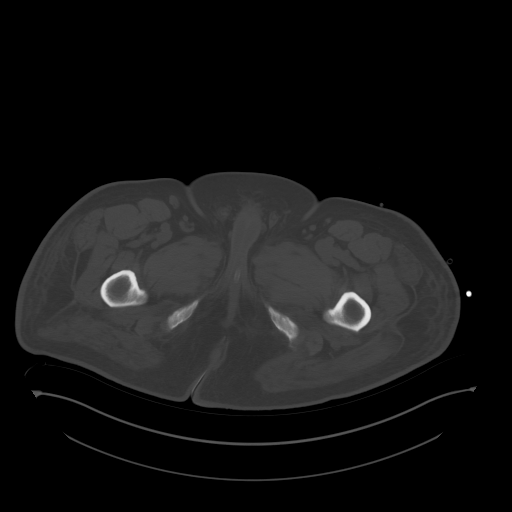
[im 14/107  soft-tissue]
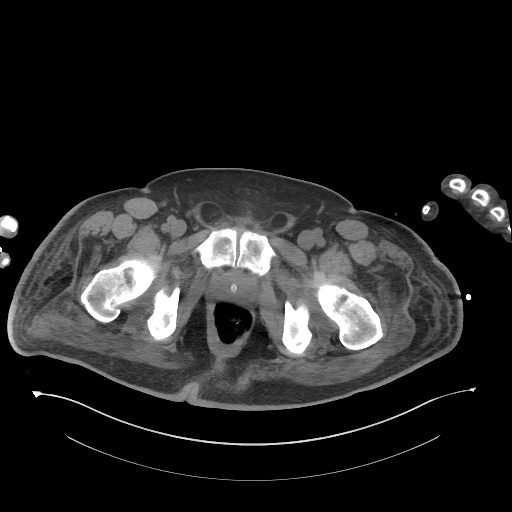
[im 23/107  soft-tissue]
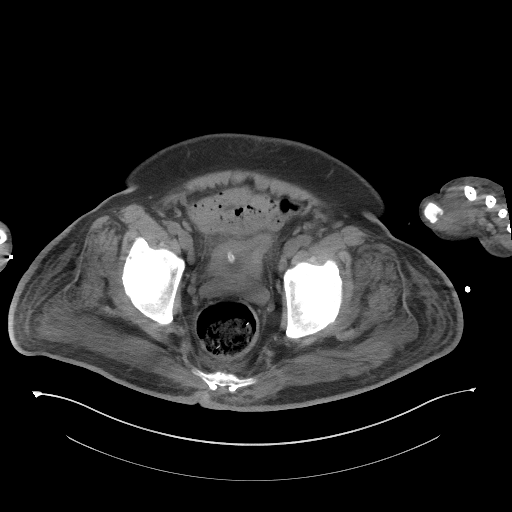
[im 31/107  soft-tissue]
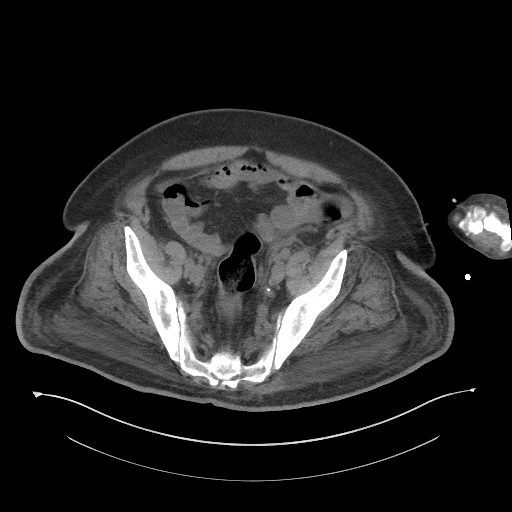
[im 40/107  soft-tissue]
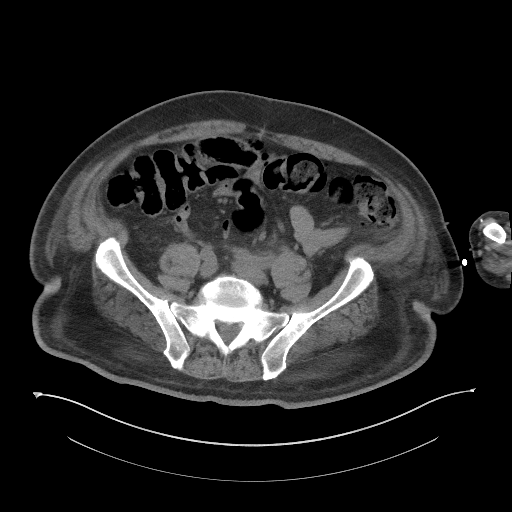
[im 49/107  soft-tissue]
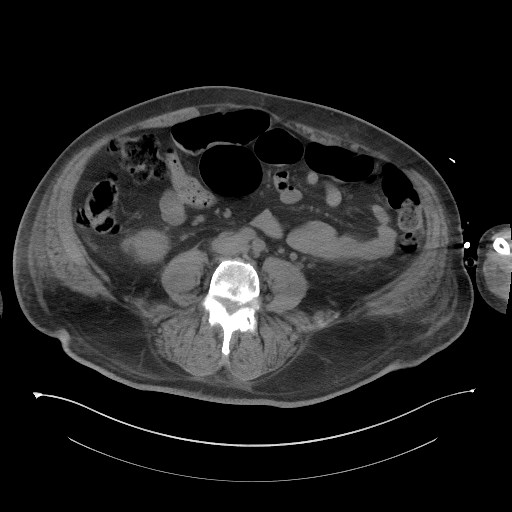
[im 58/107  soft-tissue]
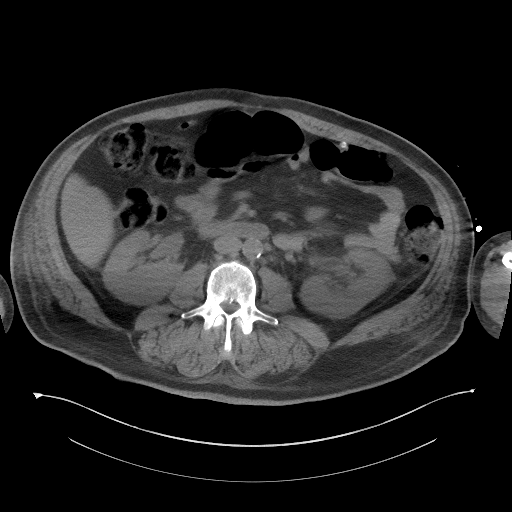
[im 67/107  soft-tissue]
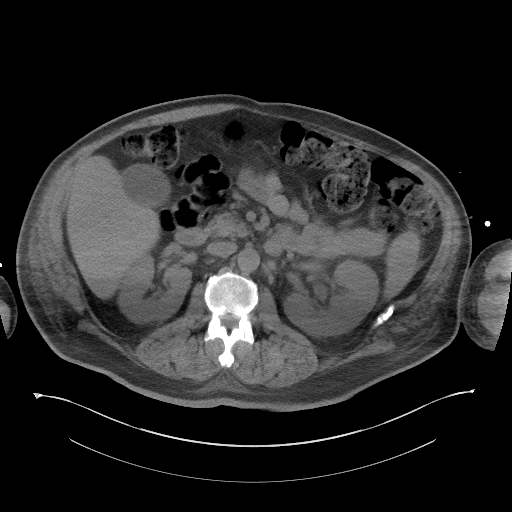
[im 76/107  soft-tissue]
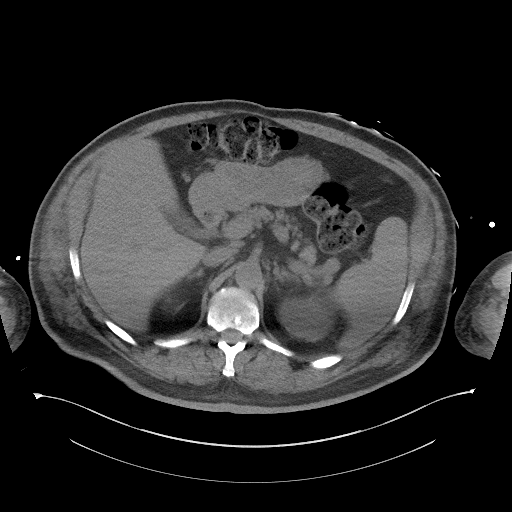
[im 76/107  bone]
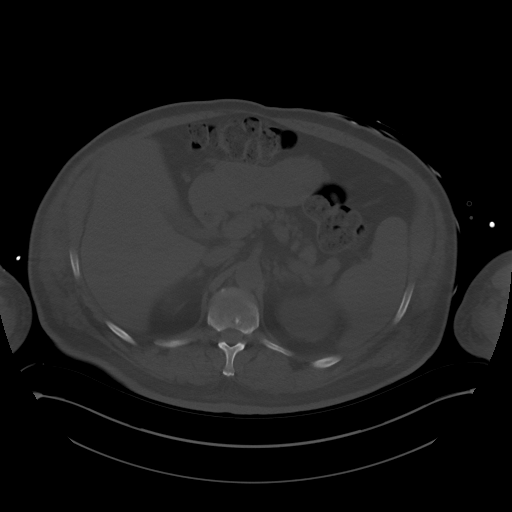
[im 84/107  soft-tissue]
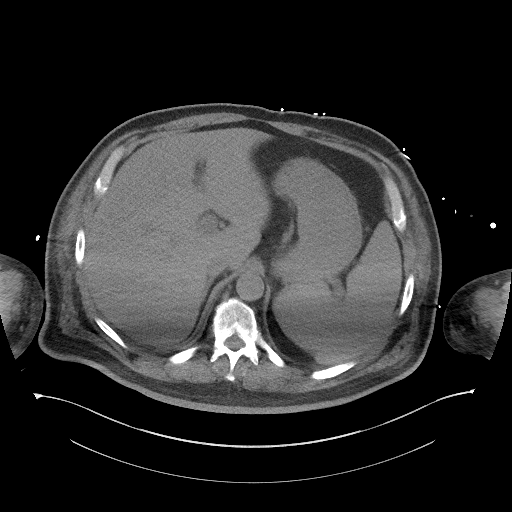
[im 93/107  soft-tissue]
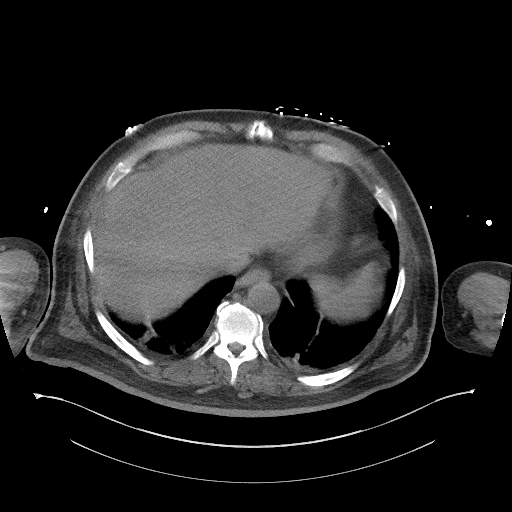
[im 102/107  soft-tissue]
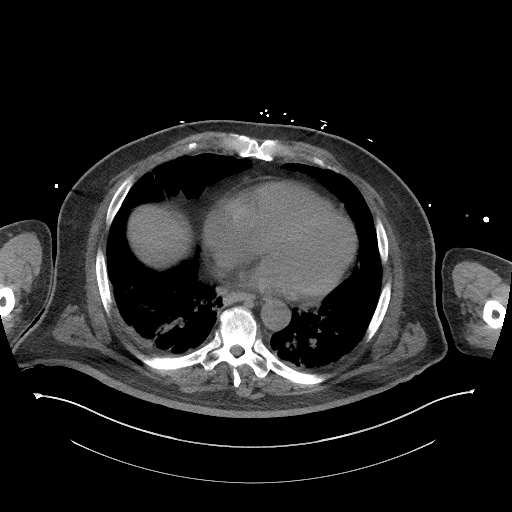

[Series 5: a/p w/o cor · coronal · non-contrast · 1.03mm/px · 3 of 151 slices shown]
[im 51/151  soft-tissue]
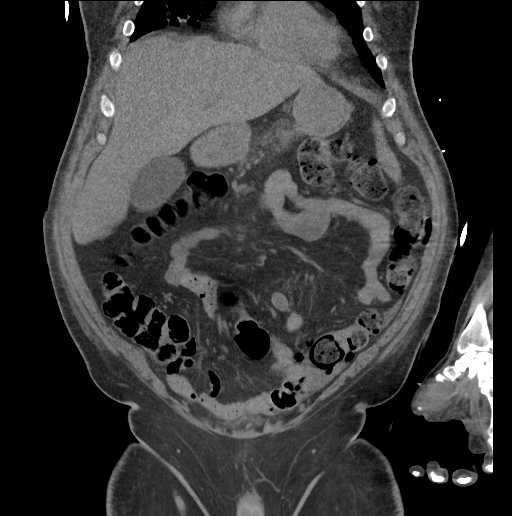
[im 67/151  soft-tissue]
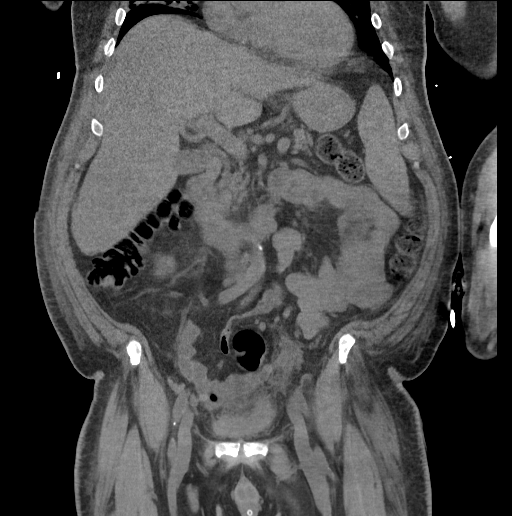
[im 84/151  soft-tissue]
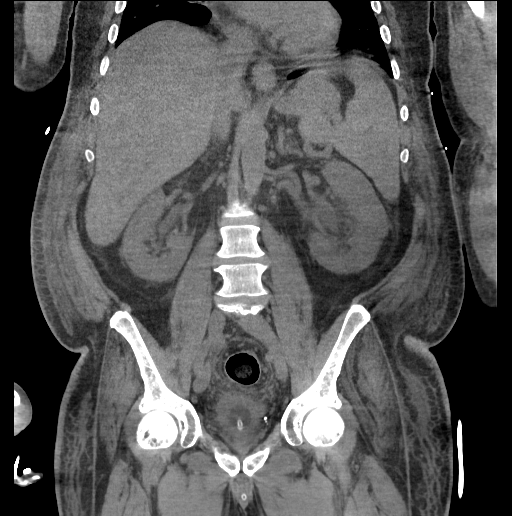

[15 of 46 positions shown; findings below may reference images not displayed]

FINDINGS: Lower chest: Bibasilar opacities in the lungs appear to be
predominantly atelectatic, however, resolving areas of airspace
consolidation could have a similar appearance. Trace right pleural
effusion lying dependently.

Hepatobiliary: No definite cystic or solid hepatic lesions are
identified in the liver on today's noncontrast CT examination.
Unenhanced appearance of the gallbladder is unremarkable.

Pancreas: No pancreatic mass or peripancreatic inflammatory changes
on today's noncontrast CT examination.

Spleen: Spleen is enlarged measuring 15.0 x 5.8 x 15.1 cm (estimated
splenic volume of 657 mL).

Adrenals/Urinary Tract: 2 mm calcification in the upper pole of the
right kidney is favored to be vascular, but could alternatively
reflect a small nonobstructive calculus. No other potential calculi
are noted within the collecting system of the left kidney, along the
course of either ureter or within the lumen of the urinary bladder.
No hydroureteronephrosis to indicate urinary tract obstruction at
this time. Mild bilateral perinephric stranding (nonspecific).
Unenhanced appearance of the kidneys is otherwise unremarkable.
Urinary bladder is nearly completely decompressed with a Foley
balloon catheter in place. Bilateral adrenal glands are normal in
appearance.

Stomach/Bowel: Unenhanced appearance of the stomach is normal. There
is no pathologic dilatation of small bowel or colon. Normal
appendix.

Vascular/Lymphatic: Atherosclerotic calcifications throughout the
abdominal and pelvic vasculature, without evidence of aneurysm. No
lymphadenopathy noted in the abdomen or pelvis on today's
noncontrast CT examination. Several borderline enlarged
retroperitoneal lymph nodes are noted, measuring up to 9 mm in short
axis in the left para-aortic nodal station (nonspecific).

Reproductive: Prostate gland and seminal vesicles are unremarkable
in appearance.

Other: Soft tissue stranding around the urinary bladder, likely
inflammatory. No well-defined fluid collection identified in the
peritoneal cavity to suggest abscess at this time. No significant
volume of ascites. No pneumoperitoneum.

Musculoskeletal: There are no aggressive appearing lytic or blastic
lesions noted in the visualized portions of the skeleton.
IMPRESSION: 1. No abscess identified in the abdomen or pelvis.
2. Inflammatory changes in the fat adjacent to the urinary bladder,
compatible with the reported clinical history of urinary tract
infection.
3. Probable vascular calcification versus tiny 2 mm nonobstructive
calculus in the upper pole of the right kidney.
4. Normal appendix.
5. Splenomegaly.
6. Areas of atelectasis or resolving airspace consolidation in the
lung bases bilaterally with trace right pleural effusion.
7. Atherosclerosis.

## 2018-03-13 ENCOUNTER — Telehealth (INDEPENDENT_AMBULATORY_CARE_PROVIDER_SITE_OTHER): Payer: Self-pay | Admitting: Orthopedic Surgery

## 2018-03-13 ENCOUNTER — Ambulatory Visit (INDEPENDENT_AMBULATORY_CARE_PROVIDER_SITE_OTHER): Payer: Medicaid Other | Admitting: Orthopedic Surgery

## 2018-03-13 ENCOUNTER — Encounter (INDEPENDENT_AMBULATORY_CARE_PROVIDER_SITE_OTHER): Payer: Self-pay | Admitting: Orthopedic Surgery

## 2018-03-13 ENCOUNTER — Ambulatory Visit (INDEPENDENT_AMBULATORY_CARE_PROVIDER_SITE_OTHER): Payer: Medicaid Other

## 2018-03-13 VITALS — Ht 72.0 in | Wt 220.0 lb

## 2018-03-13 DIAGNOSIS — Z89511 Acquired absence of right leg below knee: Secondary | ICD-10-CM

## 2018-03-13 DIAGNOSIS — I87332 Chronic venous hypertension (idiopathic) with ulcer and inflammation of left lower extremity: Secondary | ICD-10-CM

## 2018-03-13 DIAGNOSIS — L97929 Non-pressure chronic ulcer of unspecified part of left lower leg with unspecified severity: Secondary | ICD-10-CM

## 2018-03-13 NOTE — Telephone Encounter (Signed)
Ok refill? 

## 2018-03-13 NOTE — Telephone Encounter (Signed)
Medication Request  20 mg Prednisone    Patient called in a lot of pain

## 2018-03-13 NOTE — Progress Notes (Signed)
Office Visit Note   Patient: Gregory [name]           Date of Birth: 05-31-1961           MRN: [id number] Visit Date: 03/13/2018              Requested by: No referring provider defined for this encounter. PCP: Patient, No Pcp Per  Chief Complaint  Patient presents with  . Right Foot - Pain  . Right Leg - Pain      HPI: Patient is a 57 year old gentleman diabetic insensate neuropathy status post right transtibial amputation with venous ulcers on the left leg.  Patient is wearing compression stocking on the left patient complains of painful muscle spasms for the right leg.  He states he is on 2 different types of pain pills during the day 15 and was taking Lyrica 300 mg twice a day but now is only taking 150 mg twice a day.  Patient states he wants a higher level of Lyrica.  Patient is also on Lasix 40 mg a day and he states this was started due to his inability to urinate he states he urinates all the time.  Assessment & Plan: Visit Diagnoses:  1. Acquired absence of right leg below knee [facility])     Plan: Recommended patient discontinue his Lasix.  I feel his muscle spasms are secondary to his electrolyte imbalance.  Recommend coconut water to help restore his electrolyte balance.  Discussed that he should not go up any higher than the 150 mg twice a day Lyrica due to his GFR only being 40.  Follow-Up Instructions: No follow-ups on file.   Ortho Exam  Patient is alert, oriented, no adenopathy, well-dressed, normal affect, normal respiratory effort. Examination patient smells of urine he ambulates in a wheelchair manually.  Examination of the right lower extremity has a well consolidated limb good range of motion of the knee he complains of neuropathic pain over the anterior compartment.  There is no redness no ulcerations.  Examination left leg he has much improved venous stasis swelling the venous ulcers are almost completely healed.  There is some mild pitting edema.  Patient's left  second toe has some swelling with an ulcer over the PIP joint about 2 mm in diameter but there is no cellulitis no drainage.  Imaging: No results found. No images are attached to the encounter.  Labs: Lab Results  Component Value Date   HGBA1C 7.2 (H) 03/25/2017   HGBA1C 5.6 08/14/2016   HGBA1C 5.5 01/18/2016   ESRSEDRATE 120 (H) 03/25/2017   ESRSEDRATE 84 (H) 10/02/2016   ESRSEDRATE 132 (H) 12/01/2015   CRP 4.9 (H) 03/25/2017   CRP 30.6 (H) 10/02/2016   CRP 13.7 (H) 11/11/2015   REPTSTATUS 03/30/2017 FINAL 03/25/2017   GRAMSTAIN  11/11/2015    ABUNDANT WBC PRESENT,BOTH PMN AND MONONUCLEAR RARE SQUAMOUS EPITHELIAL CELLS PRESENT FEW GRAM POSITIVE COCCI IN PAIRS RARE GRAM POSITIVE RODS RARE GRAM NEGATIVE COCCOBACILLI    CULT NO GROWTH 5 DAYS 03/25/2017   LABORGA PROVIDENCIA [name] (A) 01/12/2017     Lab Results  Component Value Date   ALBUMIN 3.2 (L) 03/28/2017   ALBUMIN 3.2 (L) 03/25/2017   ALBUMIN 3.7 01/12/2017   PREALBUMIN 23.3 03/25/2017    Body mass index is 29.84 kg/m.  Orders:  Orders Placed This Encounter  Procedures  . XR Knee 1-2 Views Right   No orders of the defined types were placed in this encounter.    Procedures:  No procedures performed  Clinical Data: No additional findings.  ROS:  All other systems negative, except as noted in the HPI. Review of Systems  Objective: Vital Signs: Ht 6' (1.829 m)   Wt 220 lb (99.8 kg)   BMI 29.84 kg/m   Specialty Comments:  No specialty comments available.  PMFS History: Patient Active Problem List   Diagnosis Date Noted  . 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 Great toe amputation status, left ([place]) 11/14/2017  . Acquired absence of right leg below knee ([place]) 06/20/2017  . Osteomyelitis ([place]) 03/25/2017  . Skin cancer 03/25/2017  . Depression, recurrent ([place]) 11/05/2016  . Diabetic ulcer of toe of left foot ([place])  10/02/2016  . 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 Chronic pain 09/27/2016  . Dyslipidemia associated with type 2 diabetes mellitus ([place]) 06/06/2016  . GERD without esophagitis 05/11/2016  . Hypertensive heart disease with congestive heart failure ([place]) 12/15/2015  . Spinal stenosis in cervical region 12/15/2015  . Type II diabetes mellitus with neurological manifestations ([place]) 12/15/2015  . Chronic diastolic CHF (congestive heart failure) ([place])   . Pulmonary hypertension ([place])   . Urinary retention   . Foot drop, bilateral   . MRSA bacteremia 10/03/2015  . COPD (chronic obstructive pulmonary disease) ([place]) 10/03/2015   Past Medical History:  Diagnosis Date  . Acute osteomyelitis of calcaneum, right ([place]) 10/02/2016  . Acute respiratory failure ([place])   . Anemia   . Basal cell carcinoma, eyelid   . Cancer ([place])   . Chronic back pain   . COPD (chronic obstructive pulmonary disease) ([place])   . DDD (degenerative disc disease), lumbar   . Diabetes mellitus without complication ([place])   . Diabetic ulcer of toe of left foot ([place]) 10/02/2016  . 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 Foot drop, bilateral   . Foot drop, right 12/27/2015  . Hyperlipidemia   . Hypertension   . MRSA bacteremia   . Osteomyelitis ([place])   . Osteomyelitis ([place])   . Urinary retention   . Urinary retention     Family History  Problem Relation Age of Onset  . Cancer Other   . Diabetes Other     Past Surgical History:  Procedure Laterality Date  . AMPUTATION Left 03/29/2017   Procedure: LEFT GREAT TOE AMPUTATION AT METATARSOPHALANGEAL JOINT;  Surgeon: [name], MD;  Location: [facility];  Service: Orthopedics;  Laterality: Left;  . AMPUTATION Right 03/29/2017   Procedure: RIGHT BELOW KNEE AMPUTATION;  Surgeon: [name], MD;  Location: [facility];  Service: Orthopedics;  Laterality: Right;  . ANTERIOR CERVICAL CORPECTOMY N/A 11/25/2015   Procedure: ANTERIOR CERVICAL FIVE CORPECTOMY Cervical four - six fusion;  Surgeon:  Consuella Lose, MD;  Location: [facility];  Service: Neurosurgery;  Laterality: N/A;  ANTERIOR CERVICAL FIVE CORPECTOMY Cervical four - six fusion  . POSTERIOR CERVICAL FUSION/FORAMINOTOMY N/A 11/29/2015   Procedure: Cervical Three-Cervical Seven Posterior Cervical Laminectomy with Fusion;  Surgeon: Consuella Lose, MD;  Location: [facility];  Service: Neurosurgery;  Laterality: N/A;  Cervical Three-Cervical Seven Posterior Cervical Laminectomy with Fusion   Social History   Occupational History  . Not on file  Tobacco Use  . Smoking status: Former Smoker    Packs/day: 3.00    Types: Cigarettes  . Smokeless tobacco: Never Used  Substance and Sexual Activity  . Alcohol use: No  . Drug use: No  . Sexual activity: Not on file

## 2018-03-13 NOTE — Telephone Encounter (Signed)
Please advise. Thank you

## 2018-03-14 ENCOUNTER — Other Ambulatory Visit (INDEPENDENT_AMBULATORY_CARE_PROVIDER_SITE_OTHER): Payer: Self-pay

## 2018-03-14 MED ORDER — PREDNISONE 20 MG PO TABS: ORAL_TABLET | ORAL | 0 refills | Status: DC

## 2018-03-14 NOTE — Telephone Encounter (Signed)
Printed off Rx and faxed to [place] at patients facility at [phone].

## 2018-03-14 NOTE — Telephone Encounter (Signed)
Can you call patient and ask what pharmacy this needs to go to?  I assume 1 po qd, #30 would be ok to send in.  Please send Rx in for patient, thanks.

## 2018-03-21 ENCOUNTER — Telehealth (INDEPENDENT_AMBULATORY_CARE_PROVIDER_SITE_OTHER): Payer: Self-pay | Admitting: Orthopedic Surgery

## 2018-03-21 NOTE — Telephone Encounter (Signed)
What does he want an mri of

## 2018-03-21 NOTE — Telephone Encounter (Signed)
Dr [name] please advise. Thank you.

## 2018-03-21 NOTE — Telephone Encounter (Signed)
Patient called stating that his leg is still bothering him and wanted to know if [name] would order another MRI.  Please advise patient.  CB#[phone].  Thank you.

## 2018-03-21 NOTE — Telephone Encounter (Signed)
Scheduled patient appt to come in on Wed 9th at 245 pm to see Dr [name]

## 2018-03-26 ENCOUNTER — Ambulatory Visit (INDEPENDENT_AMBULATORY_CARE_PROVIDER_SITE_OTHER): Payer: Self-pay | Admitting: Orthopedic Surgery

## 2018-04-10 ENCOUNTER — Telehealth (INDEPENDENT_AMBULATORY_CARE_PROVIDER_SITE_OTHER): Payer: Self-pay | Admitting: Orthopedic Surgery

## 2018-04-10 NOTE — Telephone Encounter (Signed)
Patient called asking if Dr [name] can call in a pain patch. Patient states that the pain is so bad it is changing his personality. Patients # [phone]

## 2018-04-11 ENCOUNTER — Telehealth (INDEPENDENT_AMBULATORY_CARE_PROVIDER_SITE_OTHER): Payer: Self-pay

## 2018-04-11 NOTE — Telephone Encounter (Signed)
Left message for patient to return call to office for a follow up appt to access his pain.

## 2018-04-11 NOTE — Telephone Encounter (Signed)
Tried to call patient, refer to notes.

## 2018-04-16 ENCOUNTER — Ambulatory Visit (INDEPENDENT_AMBULATORY_CARE_PROVIDER_SITE_OTHER): Payer: Medicaid Other | Admitting: Family

## 2018-04-17 ENCOUNTER — Other Ambulatory Visit: Payer: Self-pay | Admitting: Urology

## 2018-04-22 ENCOUNTER — Encounter (INDEPENDENT_AMBULATORY_CARE_PROVIDER_SITE_OTHER): Payer: Self-pay | Admitting: Orthopedic Surgery

## 2018-04-22 ENCOUNTER — Ambulatory Visit (INDEPENDENT_AMBULATORY_CARE_PROVIDER_SITE_OTHER): Payer: Medicaid Other | Admitting: Orthopedic Surgery

## 2018-04-22 VITALS — Ht 72.0 in | Wt 220.0 lb

## 2018-04-22 DIAGNOSIS — L97929 Non-pressure chronic ulcer of unspecified part of left lower leg with unspecified severity: Secondary | ICD-10-CM

## 2018-04-22 DIAGNOSIS — Z89511 Acquired absence of right leg below knee: Secondary | ICD-10-CM

## 2018-04-22 DIAGNOSIS — I87332 Chronic venous hypertension (idiopathic) with ulcer and inflammation of left lower extremity: Secondary | ICD-10-CM

## 2018-04-22 NOTE — Progress Notes (Signed)
Office Visit Note   Patient: Gregory [name]           Date of Birth: 25-Jan-1961           MRN: [id number] Visit Date: 04/22/2018              Requested by: No referring provider defined for this encounter. PCP: Patient, No Pcp Per  Chief Complaint  Patient presents with  . Right Leg - Follow-up    BKA of right leg      HPI: Patient is a 57 year old gentleman who presents in follow-up status post right transtibial amputation.  He states he has increased pain in the right lower extremity he states he is currently getting 15 mg of oxycodone 4 times a day and he states he wants 30 mg of oxycodone 4 times a day he states the Lyrica is now 150 mg twice a day and he requests 300 mg twice a day.  His GFR is 40.  Patient is not on Lasix at this time he is not wearing his compression stockings.  Patient states that due to chronic indwelling Foley catheter he has urethral strictures and is having surgery this Friday.  Assessment & Plan: Visit Diagnoses:  1. Acquired absence of right leg below knee ([place])   2. 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  Had a long discussion regarding the appropriateness of narcotics.  Discussed that I cannot manage high levels of narcotics or high levels of Lyrica or Lasix management.  Recommended medical management for these issues.  Discussed the patient may benefit from a sympathetic block or a epidural steroid injection for the right leg pain he does have a history of lumbar disc disease.  Follow-Up Instructions: Return if symptoms worsen or fail to improve.   Ortho Exam  Patient is alert, oriented, no adenopathy, well-dressed, normal affect, normal respiratory effort. Examination patient's right lower extremity transtibial amputation is well-healed he has full range of  motion of the knee there is no redness no cellulitis no swelling no signs of infection.  Examination left leg he has venous stasis swelling brawny edema the ulcers have healed he also has swelling in the forefoot status post great toe amputation there is some swelling of the second toe but no open ulcers no drainage.  Imaging: No results found. No images are attached to the encounter.  Labs: Lab Results  Component Value Date   HGBA1C 7.2 (H) 03/25/2017   HGBA1C 5.6 08/14/2016   HGBA1C 5.5 01/18/2016   ESRSEDRATE 120 (H) 03/25/2017   ESRSEDRATE 84 (H) 10/02/2016   ESRSEDRATE 132 (H) 12/01/2015   CRP 4.9 (H) 03/25/2017   CRP 30.6 (H) 10/02/2016   CRP 13.7 (H) 11/11/2015   REPTSTATUS 03/30/2017 FINAL 03/25/2017   GRAMSTAIN  11/11/2015    ABUNDANT WBC PRESENT,BOTH PMN AND MONONUCLEAR RARE SQUAMOUS EPITHELIAL CELLS PRESENT FEW GRAM POSITIVE COCCI IN PAIRS RARE GRAM POSITIVE RODS RARE GRAM NEGATIVE COCCOBACILLI    CULT NO GROWTH 5 DAYS 03/25/2017   LABORGA PROVIDENCIA [name] (A) 01/12/2017     Lab Results  Component Value Date   ALBUMIN 3.2 (L) 03/28/2017   ALBUMIN 3.2 (L) 03/25/2017   ALBUMIN 3.7 01/12/2017   PREALBUMIN 23.3 03/25/2017    Body mass index is 29.84 kg/m.  Orders:  No orders of the defined types  were placed in this encounter.  No orders of the defined types were placed in this encounter.    Procedures: No procedures performed  Clinical Data: No additional findings.  ROS:  All other systems negative, except as noted in the HPI. Review of Systems  Objective: Vital Signs: Ht 6' (1.829 m)   Wt 220 lb (99.8 kg)   BMI 29.84 kg/m   Specialty Comments:  No specialty comments available.  PMFS History: Patient Active Problem List   Diagnosis Date Noted  . 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 Great toe amputation status, left 11/14/2017  . Acquired absence of right leg  below knee ([place]) 06/20/2017  . Osteomyelitis ([place]) 03/25/2017  . Skin cancer 03/25/2017  . Depression, recurrent ([place]) 11/05/2016  . Diabetic ulcer of toe of left foot ([place]) 10/02/2016  . 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 Chronic pain 09/27/2016  . Dyslipidemia associated with type 2 diabetes mellitus ([place]) 06/06/2016  . GERD without esophagitis 05/11/2016  . Hypertensive heart disease with congestive heart failure ([place]) 12/15/2015  . Spinal stenosis in cervical region 12/15/2015  . Type II diabetes mellitus with neurological manifestations ([place]) 12/15/2015  . Chronic diastolic CHF (congestive heart failure) ([place])   . Pulmonary hypertension ([place])   . Urinary retention   . Foot drop, bilateral   . MRSA bacteremia 10/03/2015  . COPD (chronic obstructive pulmonary disease) ([place]) 10/03/2015   Past Medical History:  Diagnosis Date  . Acute osteomyelitis of calcaneum, right ([place]) 10/02/2016  . Acute respiratory failure ([place])   . Anemia   . Basal cell carcinoma, eyelid   . Cancer ([place])   . Chronic back pain   . COPD (chronic obstructive pulmonary disease) ([place])   . DDD (degenerative disc disease), lumbar   . Diabetes mellitus without complication ([place])   . Diabetic ulcer of toe of left foot ([place]) 10/02/2016  . 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 Foot drop, bilateral   . Foot drop, right 12/27/2015  . Hyperlipidemia   . Hypertension   . MRSA bacteremia   . Osteomyelitis ([place])   . Osteomyelitis ([place])   . Urinary retention   . Urinary retention     Family History  Problem Relation Age of Onset  . Cancer Other   . Diabetes Other     Past Surgical History:  Procedure Laterality Date  . AMPUTATION Left 03/29/2017   Procedure: LEFT GREAT TOE AMPUTATION AT METATARSOPHALANGEAL JOINT;  Surgeon: [name], MD;  Location: [facility];  Service: Orthopedics;  Laterality: Left;  . AMPUTATION Right 03/29/2017   Procedure: RIGHT BELOW KNEE AMPUTATION;  Surgeon: [name], MD;  Location: [facility];   Service: Orthopedics;  Laterality: Right;  . ANTERIOR CERVICAL CORPECTOMY N/A 11/25/2015   Procedure: ANTERIOR CERVICAL FIVE CORPECTOMY Cervical four - six fusion;  Surgeon: Consuella Lose, MD;  Location: [facility];  Service: Neurosurgery;  Laterality: N/A;  ANTERIOR CERVICAL FIVE CORPECTOMY Cervical four - six fusion  . POSTERIOR CERVICAL FUSION/FORAMINOTOMY N/A 11/29/2015   Procedure: Cervical Three-Cervical Seven Posterior Cervical Laminectomy with Fusion;  Surgeon: Consuella Lose, MD;  Location: [facility];  Service: Neurosurgery;  Laterality: N/A;  Cervical Three-Cervical Seven Posterior Cervical Laminectomy with Fusion   Social History   Occupational History  . Not on file  Tobacco Use  . Smoking status: Former Smoker    Packs/day: 3.00    Types: Cigarettes  . Smokeless tobacco: Never Used  Substance and  Sexual Activity  . Alcohol use: No  . Drug use: No  . Sexual activity: Not on file

## 2018-04-23 ENCOUNTER — Encounter (HOSPITAL_COMMUNITY): Payer: Self-pay | Admitting: *Deleted

## 2018-04-23 ENCOUNTER — Inpatient Hospital Stay (HOSPITAL_COMMUNITY): Admission: RE | Admit: 2018-04-23 | Payer: Medicaid Other | Source: Ambulatory Visit

## 2018-04-23 NOTE — Progress Notes (Addendum)
Preop instructions for: Gregory [name]                         Date of Birth: May 28, 1961                            Date of Procedure: 04/25/2018        Doctor: Kathie Rhodes  Time to arrive at [facility] Report to: Admitting  Procedure: Cystoscopy with retrograde urethrogram /Balloon dilation  Any procedure time changes, MD office will notify you!   Do not eat or drink past midnight the night before your procedure.(To include any tube feedings-must be discontinued)    Take these morning medications only with sips of water.(or give through gastrostomy or feeding tube). , Finasteride, metoprolol, Omeprazole, Toviaz, Lyrica, Morphine , Mucinex, Zyrtec, inhaler if needed and bring, Nebulizer if needed, Flonase nasal spray  Note: No Insulin or Diabetic meds should be given or taken the morning of the procedure!   Facility contact: [place] POA: Laronn Devonshire- daughter- [phone]  Transportation contact phone#: [place]  Please send day of procedure:current med list and meds last taken that day, confirm nothing by mouth status from what time, Patient Demographic info( to include DNR status, problem list, allergies)   RN contact name/phone#: [place]                             and Fax #: [phone] Bring Insurance card and picture ID Leave all jewelry and other valuables at place where living( no metal or rings to be worn) No contact lens  l Men-no colognes,lotions, powders or deodorant   Any questions day of procedure,call Short Stay [phone]    Sent from :[facility]                   [place]                   [phone]  Sent by :Gillian Shields RN

## 2018-04-23 NOTE — Progress Notes (Signed)
REceived call from [place] at [facility] and she stated she had received preop instructions that nurse had faxed but updated med list as of 04/23/2018 that she would fax to [facility].  Received updated MAR and incomparing with original MAR sent there were several changes in the Med List.  [facility] and spoke with Sam and updated medlist .  Refaxed to [facility] updated preop instruction list to include updated meds with fax confirmation received

## 2018-04-24 NOTE — H&P (Signed)
HPI: Gregory [name] is a 57 year-old male with a urethral stricture.  The patient complains of lower urinary tract symptom(s) that include frequency, urgency, and sense of incomplete emptying. He has had the symptom(s) for 9 months. His symptoms did begin gradually. He gets up at night to urinate 2 times. He does have urgency. This condition would be considered of mild to moderate severity with no modifying factors or associated signs or symptoms other than as noted above.   12/12/17: The patient recently reported a 3 month history of intermittent dysuria, frequency and a sense of incomplete bladder emptying. Several urinalyses were checked and noted to be clear. He had his Flomax increased to 0.8 mg and in 5/19 was started on finasteride.  He reports to me that after being taught intermittent self catheterization he ended up developing a UTI, was admitted to the hospital and had a Foley catheter placed. This then remained indwelling but he started having what he thought was bladder contractions and wanted to see how he would do with the catheter out and in 8/18 his catheter was removed and he said he began voiding spontaneously. He now he has urgency and urge incontinence. It occurs both day and night. He denies any dysuria or hematuria.   04/17/18: When I saw him last he was having urgency with urge incontinence with an acceptable PVR and therefore I started him on samples of Myrbetriq.  Currently reports that he is experiencing dysuria this been present for approximately a week. A urinalysis was done was found to be negative. He is currently taking Flomax, finasteride and oxybutynin at a dosage of 2 immediate release pills once per day. He was given some Pyridium for his dysuria but only saw a slight amount of improvement. He said he typically urinates with a good stream but he feels like he has not emptied and will strip his penis and then empty some more.     ALLERGIES: No Allergies    MEDICATIONS:  Finasteride 5 mg tablet  Metformin Hcl 1,000 mg tablet  Metoprolol Tartrate 25 mg tablet  Omeprazole 20 mg capsule,delayed release  Tamsulosin Hcl 0.4 mg capsule  Acetaminophen 325 mg tablet Oral  Amaryl 2 mg tablet Oral  Biscolax 10 mg suppository, rectal  Bupropion Xl 450 mg tablet, extended release 24 hr  Colace 100 mg capsule Oral  Cyclobenzaprine Hcl 10 mg tablet  D3-50 50,000 unit capsule  Decubi Vite 400 mcg-50 mg-500 mg capsule Oral  Flomax 0.4 mg capsule Oral  Flonase Suspension  Gemfibrozil 600 mg tablet  Glucagon Emergency Kit 1 mg vial  Humalog Kwikpen U-100 100 unit/ml insulin pen Subcutaneous  Hydroxyzine Hcl 25 mg tablet  Ipratropium-Albuterol 0.5 mg-3 mg (2.5 mg base)/3 ml ampul for nebulization  Lantus 100 unit/ml vial Subcutaneous  Lasix 40 mg tablet Oral  Lisinopril 10 mg tablet Oral  Lunesta 2 mg tablet  Lyrica 150 mg capsule Oral  Mag-Ox 400 MG TABS Oral  Miralax 17 gram/dose powder  Miralax 17 gram/dose powder Oral  Morphine Sulfate 15 mg tablet  Multivitamin  Oxycodone Hcl 15 mg tablet  Oxycodone Hcl 30 mg tablet Oral  Oxycodone Hcl 10 mg tablet Oral  Paxil 40 mg tablet  Prilosec 20 mg capsule,delayed release Oral  Proair Hfa 90 mcg hfa aerosol with adapter Inhalation  Pro-Stat Sugar Free 15 gram-100 kcal/30 ml liquid Oral  Senna-Docusate Sodium  Toprol Xl 50 mg tablet, extended release 24 hr Oral  Vancomycin Hcl 1 gram vial Intravenous  Victoza  3-Pak 0.6 mg/0.1 ml (18 mg/3 ml) pen injector Subcutaneous  Vitamin B 12  Zanaflex 4 mg tablet Oral  Zofran     GU PSH: None     PSH Notes: No Surgical Problems   NON-GU PSH: None   GU PMH: BPH w/LUTS (Stable), He does have BPH and is currently on maximum medical management. He has a slight elevation of his PVR which I am not sure is clinically significant. - 12/12/2017 Urge incontinence, I am going to have him 1st try Myrbetriq 25 mg for 2 weeks. If this is effective he will contact me otherwise he  will try the 50 mg dose and then I want him to return in 4 weeks for reassessment and a check of his PVR. - 12/12/2017 Urinary Urgency, He now has urgency with urge incontinence. I have recommended he remain on his current medications and then I will add further medication as his problem is not a failure to empty but rather a failure to store. - 12/12/2017 BPH w/o LUTS, BPH (benign prostatic hypertrophy) - 2017 Urinary Retention, Acute urinary retention - 2017      PMH Notes: Urinary retention: In 2/17 he was found to be in urinary retention and with catheterization had a postobstructive diuresis indicating long-standing obstruction. He underwent an unsuccessful voiding trial during a hospitalization in 3/17 and in the office in 4/17. He indicated he had been on Flomax in the past and had experienced frequency, hesitancy and nocturia as well as intermittency with a slow stream.   Urodynamics 10/13/15: He was unable to generate a contraction and was therefore taught intermittent self-catheterization with recommendations to perform this 5 times per day in order to maintain a volume of less than 400 cc.    NON-GU PMH: Osteomyelitis, unspecified, Bone infection - 2017 Other chronic pain, Chronic pain - 2017 Personal history of other diseases of the circulatory system, History of hypertension - 2017 Personal history of other diseases of the musculoskeletal system and connective tissue, History of osteomyelitis - 2017 Personal history of other diseases of the respiratory system, History of acute respiratory failure - 2017 Personal history of other endocrine, nutritional and metabolic disease, History of type 2 diabetes mellitus - 2017 Personal history of other mental and behavioral disorders, History of depression - 2017 Pneumonia due to Methicillin resistant Staphylococcus aureus, Methicillin resistant Staphylococcus aureus pneumonia - 2017 Encounter for general adult medical examination without abnormal  findings, Encounter for preventive health examination    FAMILY HISTORY: Deceased - Runs In Family Prostate Cancer - Runs In Family   SOCIAL HISTORY: None   REVIEW OF SYSTEMS:    GU Review Male:   Patient denies frequent urination, hard to postpone urination, burning/ pain with urination, get up at night to urinate, leakage of urine, stream starts and stops, trouble starting your stream, have to strain to urinate , erection problems, and penile pain.  Gastrointestinal (Upper):   Patient denies nausea, vomiting, and indigestion/ heartburn.  Gastrointestinal (Lower):   Patient denies diarrhea and constipation.  Constitutional:   Patient denies fever, night sweats, weight loss, and fatigue.  Skin:   Patient denies skin rash/ lesion and itching.  Eyes:   Patient denies blurred vision and double vision.  Ears/ Nose/ Throat:   Patient denies sore throat and sinus problems.  Hematologic/Lymphatic:   Patient denies swollen glands and easy bruising.  Cardiovascular:   Patient denies leg swelling and chest pains.  Respiratory:   Patient denies cough and shortness of breath.  Endocrine:   Patient denies excessive thirst.  Musculoskeletal:   Patient denies back pain and joint pain.  Neurological:   Patient denies dizziness and headaches.  Psychologic:   Patient denies depression and anxiety.   VITAL SIGNS:    Weight 220 lb / 99.79 kg  Height 72 in / 182.88 cm  BP 143/81 mmHg  Pulse 96 /min  Temperature 98.2 F / 36.7 C  BMI 29.8 kg/m   Physical Exam  Constitutional: Well nourished and well developed . No acute distress.   ENT:. The ears and nose are normal in appearance.   Neck: The appearance of the neck is normal and no neck mass is present.   Pulmonary: No respiratory distress and normal respiratory rhythm and effort.   Cardiovascular: Heart rate and rhythm are normal . No peripheral edema.   Abdomen: The abdomen is soft and nontender. No masses are palpated. No CVA tenderness. No  hernias are palpable. No hepatosplenomegaly noted.   Rectal: Rectal exam demonstrates normal sphincter tone, no tenderness and no masses. The prostate has no nodularity and is not tender. The left seminal vesicle is nonpalpable. The right seminal vesicle is nonpalpable. The perineum is normal on inspection.   Genitourinary: Examination of the penis demonstrates an indwelling catheter, but no discharge, no masses, no lesions and a normal meatus. The scrotum is without lesions. The right epididymis is palpably normal and non-tender. The left epididymis is palpably normal and non-tender. The right testis is non-tender and without masses. The left testis is non-tender and without masses.   Lymphatics: The femoral and inguinal nodes are not enlarged or tender.   Skin: Normal skin turgor, no visible rash and no visible skin lesions.   Neuro/Psych:. Mood and affect are appropriate.      PAST DATA REVIEWED:  Source Of History:  Patient  Lab Test Review:   BUN/Creatinine  Records Review:   Previous Patient Records, POC Tool  Notes:                     His creatinine in 10/18 was 2.06.   PROCEDURES:         Flexible Cystoscopy findings from 04/17/18 .        PVR Ultrasound - [id number]  Scanned Volume: 112 cc  The urethra was noted be normal down to the bulbar region where a severe stricture was identified.       Urinalysis Dipstick Dipstick Cont'd  Color: Yellow Bilirubin: Neg mg/dL  Appearance: Clear Ketones: Neg mg/dL  Specific Gravity: 1.010 Blood: Neg ery/uL  pH: 5.5 Protein: Neg mg/dL  Glucose: Neg mg/dL Urobilinogen: 0.2 mg/dL    Nitrites: Neg    Leukocyte Esterase: Neg leu/uL    ASSESSMENT/PLAN:      ICD-10 Details  1 GU:   Urge incontinence - N39.41 Stable - His urge incontinence is currently being managed with oxybutynin at a dosage of 10 mg of immediate release once a day. I have recommended this be stopped. I am going to have him try Toviaz 8 mg and see if this is more  effective.  2   Encounter for Prostate Cancer screening - Z12.5 He has not had a PSA done in some time. I will obtain a screening  3   Incomplete bladder emptying - R39.14 He is not emptying his bladder completely and this is at least impart likely due to his bulbar urethral stricture that I diagnosed today.  4   Bulbar urethral stricture - N35.011  I found a bulbar urethral stricture that is pretty significant and therefore have recommended balloon dilation. I will also perform a retrograde urethrogram to assess its length. We discussed the procedure in detail, its probability of success, the risks and complications, the outpatient nature of the procedure as well as the anticipated postoperative course and the greatest risk being that of recurrence of his stricture.

## 2018-04-24 NOTE — Discharge Instructions (Signed)

## 2018-04-25 ENCOUNTER — Ambulatory Visit (HOSPITAL_COMMUNITY): Payer: Medicaid Other | Admitting: Anesthesiology

## 2018-04-25 ENCOUNTER — Encounter (HOSPITAL_COMMUNITY): Payer: Self-pay

## 2018-04-25 ENCOUNTER — Ambulatory Visit (HOSPITAL_COMMUNITY)
Admission: RE | Admit: 2018-04-25 | Discharge: 2018-04-25 | Disposition: A | Discharge status: Home / Self Care | Payer: Medicaid Other | Source: Ambulatory Visit | Attending: Urology | Admitting: Urology

## 2018-04-25 ENCOUNTER — Ambulatory Visit (HOSPITAL_COMMUNITY): Payer: Medicaid Other

## 2018-04-25 ENCOUNTER — Encounter (HOSPITAL_COMMUNITY)
Admission: RE | Disposition: A | Discharge status: Home / Self Care | Payer: Self-pay | Source: Ambulatory Visit | Attending: Urology

## 2018-04-25 DIAGNOSIS — Z794 Long term (current) use of insulin: Secondary | ICD-10-CM | POA: Diagnosis not present

## 2018-04-25 DIAGNOSIS — F329 Major depressive disorder, single episode, unspecified: Secondary | ICD-10-CM | POA: Diagnosis not present

## 2018-04-25 DIAGNOSIS — Z79899 Other long term (current) drug therapy: Secondary | ICD-10-CM | POA: Insufficient documentation

## 2018-04-25 DIAGNOSIS — N35912 Unspecified bulbous urethral stricture, male: Secondary | ICD-10-CM | POA: Diagnosis not present

## 2018-04-25 DIAGNOSIS — R339 Retention of urine, unspecified: Secondary | ICD-10-CM | POA: Insufficient documentation

## 2018-04-25 DIAGNOSIS — N401 Enlarged prostate with lower urinary tract symptoms: Secondary | ICD-10-CM | POA: Diagnosis not present

## 2018-04-25 DIAGNOSIS — E119 Type 2 diabetes mellitus without complications: Secondary | ICD-10-CM | POA: Diagnosis not present

## 2018-04-25 DIAGNOSIS — I10 Essential (primary) hypertension: Secondary | ICD-10-CM | POA: Diagnosis not present

## 2018-04-25 DIAGNOSIS — Z87891 Personal history of nicotine dependence: Secondary | ICD-10-CM | POA: Diagnosis not present

## 2018-04-25 DIAGNOSIS — N3941 Urge incontinence: Secondary | ICD-10-CM | POA: Diagnosis not present

## 2018-04-25 HISTORY — DX: Urinary tract infection, site not specified: N39.0

## 2018-04-25 HISTORY — DX: Difficulty in walking, not elsewhere classified: R26.2

## 2018-04-25 HISTORY — DX: Acquired absence of left great toe: Z89.412

## 2018-04-25 HISTORY — DX: Herpesviral vesicular dermatitis: B00.1

## 2018-04-25 HISTORY — DX: Insomnia, unspecified: G47.00

## 2018-04-25 HISTORY — DX: Muscle weakness (generalized): M62.81

## 2018-04-25 HISTORY — DX: Squamous cell carcinoma of skin, unspecified: C44.92

## 2018-04-25 HISTORY — DX: Gastro-esophageal reflux disease without esophagitis: K21.9

## 2018-04-25 HISTORY — DX: Nasal congestion: R09.81

## 2018-04-25 HISTORY — PX: CYSTOSCOPY WITH RETROGRADE URETHROGRAM: SHX6309

## 2018-04-25 HISTORY — DX: Unsteadiness on feet: R26.81

## 2018-04-25 HISTORY — DX: Major depressive disorder, single episode, unspecified: F32.9

## 2018-04-25 HISTORY — DX: Acquired absence of right leg below knee: Z89.511

## 2018-04-25 HISTORY — DX: Retention of urine, unspecified: R33.9

## 2018-04-25 HISTORY — DX: Chronic pain syndrome: G89.4

## 2018-04-25 HISTORY — DX: Other idiopathic peripheral autonomic neuropathy: G90.09

## 2018-04-25 HISTORY — DX: Depression, unspecified: F32.A

## 2018-04-25 HISTORY — DX: Chronic kidney disease, unspecified: N18.9

## 2018-04-25 HISTORY — DX: Heart failure, unspecified: I50.9

## 2018-04-25 HISTORY — DX: Neuromuscular dysfunction of bladder, unspecified: N31.9

## 2018-04-25 HISTORY — DX: Malignant melanoma of other parts of face: C43.39

## 2018-04-25 HISTORY — DX: Chronic multifocal osteomyelitis, unspecified site: M86.30

## 2018-04-25 LAB — CBC
HCT: 34.4 % — ABNORMAL LOW (ref 39.0–52.0)
Hemoglobin: 10.8 g/dL — ABNORMAL LOW (ref 13.0–17.0)
MCH: 28.6 pg (ref 26.0–34.0)
MCHC: 31.4 g/dL (ref 30.0–36.0)
MCV: 91 fL (ref 80.0–100.0)
PLATELETS: 219 10*3/uL (ref 150–400)
RBC: 3.78 MIL/uL — ABNORMAL LOW (ref 4.22–5.81)
RDW: 15.3 % (ref 11.5–15.5)
WBC: 6.6 10*3/uL (ref 4.0–10.5)
nRBC: 0 % (ref 0.0–0.2)

## 2018-04-25 LAB — HEMOGLOBIN A1C
HEMOGLOBIN A1C: 7.9 % — AB (ref 4.8–5.6)
MEAN PLASMA GLUCOSE: 180.03 mg/dL

## 2018-04-25 LAB — GLUCOSE, CAPILLARY
Glucose-Capillary: 183 mg/dL — ABNORMAL HIGH (ref 70–99)
Glucose-Capillary: 159 mg/dL — ABNORMAL HIGH (ref 70–99)

## 2018-04-25 LAB — BASIC METABOLIC PANEL
Anion gap: 11 (ref 5–15)
BUN: 35 mg/dL — AB (ref 6–20)
CALCIUM: 9.2 mg/dL (ref 8.9–10.3)
CO2: 26 mmol/L (ref 22–32)
Chloride: 103 mmol/L (ref 98–111)
Creatinine, Ser: 2.05 mg/dL — ABNORMAL HIGH (ref 0.61–1.24)
GFR calc Af Amer: 39 mL/min — ABNORMAL LOW (ref >60)
GFR, EST NON AFRICAN AMERICAN: 34 mL/min — AB (ref >60)
Glucose, Bld: 170 mg/dL — ABNORMAL HIGH (ref 70–99)
Potassium: 4.1 mmol/L (ref 3.5–5.1)
SODIUM: 140 mmol/L (ref 135–145)

## 2018-04-25 SURGERY — CYSTOSCOPY WITH RETROGRADE URETHROGRAM
Anesthesia: General

## 2018-04-25 MED ORDER — LIDOCAINE 2% (20 MG/ML) 5 ML SYRINGE
INTRAMUSCULAR | Status: AC
  Filled 2018-04-25: qty 5

## 2018-04-25 MED ORDER — LACTATED RINGERS IV SOLN
INTRAVENOUS | Status: DC
  Administered 2018-04-25: 1000 mL via INTRAVENOUS

## 2018-04-25 MED ORDER — SULFAMETHOXAZOLE-TRIMETHOPRIM 400-80 MG PO TABS: 1.0000 | ORAL_TABLET | Freq: Two times a day (BID) | ORAL | 0 refills | Status: DC

## 2018-04-25 MED ORDER — ONDANSETRON HCL 4 MG/2ML IJ SOLN
INTRAMUSCULAR | Status: DC | PRN
  Administered 2018-04-25: 4 mg via INTRAVENOUS

## 2018-04-25 MED ORDER — ONDANSETRON HCL 4 MG/2ML IJ SOLN: 4.0000 mg | Freq: Four times a day (QID) | INTRAMUSCULAR | Status: DC | PRN

## 2018-04-25 MED ORDER — IOHEXOL 300 MG/ML  SOLN
INTRAMUSCULAR | Status: DC | PRN
  Administered 2018-04-25: 10 mL

## 2018-04-25 MED ORDER — PROPOFOL 10 MG/ML IV BOLUS
INTRAVENOUS | Status: AC
  Filled 2018-04-25: qty 20

## 2018-04-25 MED ORDER — PROPOFOL 10 MG/ML IV BOLUS
INTRAVENOUS | Status: DC | PRN
  Administered 2018-04-25: 180 mg via INTRAVENOUS

## 2018-04-25 MED ORDER — MIDAZOLAM HCL 5 MG/5ML IJ SOLN
INTRAMUSCULAR | Status: DC | PRN
  Administered 2018-04-25: 2 mg via INTRAVENOUS

## 2018-04-25 MED ORDER — CIPROFLOXACIN IN D5W 400 MG/200ML IV SOLN
400.0000 mg | Freq: Once | INTRAVENOUS | Status: AC
  Administered 2018-04-25: 400 mg via INTRAVENOUS
  Filled 2018-04-25: qty 200

## 2018-04-25 MED ORDER — OXYCODONE HCL 5 MG PO TABS: 5.0000 mg | ORAL_TABLET | Freq: Once | ORAL | Status: DC | PRN

## 2018-04-25 MED ORDER — OXYCODONE HCL 5 MG/5ML PO SOLN
5.0000 mg | Freq: Once | ORAL | Status: DC | PRN
  Filled 2018-04-25: qty 5

## 2018-04-25 MED ORDER — LIDOCAINE 2% (20 MG/ML) 5 ML SYRINGE
INTRAMUSCULAR | Status: DC | PRN
  Administered 2018-04-25: 100 mg via INTRAVENOUS

## 2018-04-25 MED ORDER — ONDANSETRON HCL 4 MG/2ML IJ SOLN
INTRAMUSCULAR | Status: AC
  Filled 2018-04-25: qty 2

## 2018-04-25 MED ORDER — BELLADONNA ALKALOIDS-OPIUM 16.2-30 MG RE SUPP
RECTAL | Status: AC
  Filled 2018-04-25: qty 1

## 2018-04-25 MED ORDER — TRAMADOL HCL 50 MG PO TABS: 50.0000 mg | ORAL_TABLET | Freq: Four times a day (QID) | ORAL | 0 refills | Status: DC | PRN

## 2018-04-25 MED ORDER — FENTANYL CITRATE (PF) 100 MCG/2ML IJ SOLN
INTRAMUSCULAR | Status: AC
  Filled 2018-04-25: qty 2

## 2018-04-25 MED ORDER — FENTANYL CITRATE (PF) 100 MCG/2ML IJ SOLN
INTRAMUSCULAR | Status: DC | PRN
  Administered 2018-04-25: 50 ug via INTRAVENOUS

## 2018-04-25 MED ORDER — MIDAZOLAM HCL 2 MG/2ML IJ SOLN
INTRAMUSCULAR | Status: AC
  Filled 2018-04-25: qty 2

## 2018-04-25 MED ORDER — FENTANYL CITRATE (PF) 100 MCG/2ML IJ SOLN: 25.0000 ug | INTRAMUSCULAR | Status: DC | PRN

## 2018-04-25 MED ORDER — STERILE WATER FOR IRRIGATION IR SOLN
Status: DC | PRN
  Administered 2018-04-25: 3000 mL

## 2018-04-25 MED ORDER — LIDOCAINE HCL URETHRAL/MUCOSAL 2 % EX GEL
CUTANEOUS | Status: AC
  Filled 2018-04-25: qty 5

## 2018-04-25 SURGICAL SUPPLY — 25 items
BAG URINE DRAINAGE (UROLOGICAL SUPPLIES) ×2 IMPLANT
BAG URO CATCHER STRL LF (MISCELLANEOUS) ×2 IMPLANT
BALLN NEPHROSTOMY (BALLOONS) ×2
BALLOON NEPHROSTOMY (BALLOONS) ×1 IMPLANT
BASKET ZERO TIP 1.9FR (BASKET) IMPLANT
BASKET ZERO TIP NITINOL 2.4FR (BASKET) IMPLANT
BSKT STON RTRVL ZERO TP 1.9FR (BASKET)
BSKT STON RTRVL ZERO TP 2.4FR (BASKET)
CATH FOLEY 2WAY SLVR  5CC 16FR (CATHETERS) ×1
CATH FOLEY 2WAY SLVR 5CC 16FR (CATHETERS) ×1 IMPLANT
CATH INTERMIT  6FR 70CM (CATHETERS) ×2 IMPLANT
CLOTH BEACON ORANGE TIMEOUT ST (SAFETY) ×2 IMPLANT
EXTRACTOR STONE 1.7FRX115CM (UROLOGICAL SUPPLIES) IMPLANT
GLOVE BIOGEL M 8.0 STRL (GLOVE) ×2 IMPLANT
GOWN STRL REUS W/TWL XL LVL3 (GOWN DISPOSABLE) ×2 IMPLANT
GUIDEWIRE ANG ZIPWIRE 038X150 (WIRE) IMPLANT
GUIDEWIRE STR DUAL SENSOR (WIRE) ×2 IMPLANT
IV NS 1000ML (IV SOLUTION)
IV NS 1000ML BAXH (IV SOLUTION) IMPLANT
MANIFOLD NEPTUNE II (INSTRUMENTS) ×2 IMPLANT
PACK CYSTO (CUSTOM PROCEDURE TRAY) ×2 IMPLANT
SHEATH URETERAL 12FRX28CM (UROLOGICAL SUPPLIES) IMPLANT
SHEATH URETERAL 12FRX35CM (MISCELLANEOUS) IMPLANT
TUBING CONNECTING 10 (TUBING) ×2 IMPLANT
TUBING UROLOGY SET (TUBING) IMPLANT

## 2018-04-25 NOTE — Transfer of Care (Signed)
Immediate Anesthesia Transfer of Care Note  Patient: Gregory [name]  Procedure(s) Performed: CYSTOSCOPY WITH RETROGRADE URETHROGRAM/ BALLOON DILATION (N/A )  Patient Location: PACU  Anesthesia Type:General  Level of Consciousness: sedated  Airway & Oxygen Therapy: Patient Spontanous Breathing and Patient connected to face mask oxygen  Post-op Assessment: Report given to RN and Post -op Vital signs reviewed and stable  Post vital signs: Reviewed and stable  Last Vitals:  Vitals Value Taken Time  BP 109/68 04/25/2018 10:46 AM  Temp    Pulse 64 04/25/2018 10:48 AM  Resp 12 04/25/2018 10:48 AM  SpO2 96 % 04/25/2018 10:48 AM  Vitals shown include unvalidated device data.  Last Pain:  Vitals:   04/25/18 0923  TempSrc:   PainSc: 0-No pain      Patients Stated Pain Goal: 4 (71/82/09 9068)  Complications: No apparent anesthesia complications

## 2018-04-25 NOTE — Op Note (Signed)
PATIENT:  Gregory [name]  PRE-OPERATIVE DIAGNOSIS: Bulbar urethral stricture  POST-OPERATIVE DIAGNOSIS: Same  PROCEDURE: 1.  Cystoscopy with urethral dilatation. 2.  Retrograde urethrogram. 3.  Fluoroscopy time less than 1 hour.  SURGEON:  Claybon Jabs  INDICATION: Gregory [name] is a 57 year old male who was performing intermittent self-catheterization but ended up developing a UTI and was admitted to the hospital.  A Foley catheter was placed in left indwelling for some time.  After it was removed he later developed persistent dysuria and was found cystoscopically to have a fairly tight bulbar urethral stricture.  He is brought to the operating room today for dilation of his stricture and retrograde urethrogram.  ANESTHESIA:  General  EBL:  Minimal  DRAINS: 16 French Foley catheter  LOCAL MEDICATIONS USED:  None  SPECIMEN: None  Description of procedure: After informed consent the patient was taken to the operating room and placed on the table in a supine position. General anesthesia was then administered. Once fully anesthetized the patient was moved to the dorsal lithotomy position and the genitalia were sterilely prepped and draped in standard fashion. An official timeout was then performed.  The 21 French cystoscope was passed under direct vision down the urethra using a 30 degree lens.  The urethra was noted to be entirely normal down to the distal bulbar region where a tight stricture was identified.  This was photographed.  I then removed the cystoscope and performed a retrograde urethrogram.  Full-strength Omnipaque contrast was injected under direct fluoroscopy using a bulb syringe.  This was placed in the urethral meatus and I injected under direct fluoroscopy and identified a moderate stricture in the distal bulb consistent with my findings cystoscopically.  It was a single stricture and was fairly short.  I reinserted the cystoscope and passed a 0.038 inch sensor guidewire  through the stricture and into the bladder under fluoroscopic control.  I then passed a dilating balloon over the guidewire and across the area of stricture under fluoroscopy.  I then began to inflate the balloon and noted the stricture yielded fairly easily.  It was filled to 14 atm and remained inflated for 1 minute I then deflated the balloon and removed this.  Repeat cystoscopy revealed a wide open stricture.  I advanced the scope through this and into the bladder.  The bladder was noted to have 1+ trabeculation.  The right and left ureteral orifice were noted to be of normal configuration and position.  There were no tumors, stones or inflammatory lesions.  The cystoscope was removed and a 109 French Foley catheter was inserted in the bladder and the balloon filled with sterile water.  The catheter was connected to closed system drainage and the patient was awakened and taken to the recovery room in stable and satisfactory condition.  He tolerated procedure well no intraoperative complications.   PLAN OF CARE: Discharge to home after PACU  PATIENT DISPOSITION:  PACU - hemodynamically stable.

## 2018-04-25 NOTE — Anesthesia Preprocedure Evaluation (Signed)
Anesthesia Evaluation  Patient identified by MRN, date of birth, ID band Patient awake    Reviewed: Allergy & Precautions, H&P , NPO status , Patient's Chart, lab work & pertinent test results  Airway Mallampati: II   Neck ROM: full    Dental   Pulmonary COPD, former smoker,    breath sounds clear to auscultation       Cardiovascular hypertension, + Peripheral Vascular Disease and +CHF   Rhythm:regular Rate:Normal     Neuro/Psych PSYCHIATRIC DISORDERS Depression  Neuromuscular disease    GI/Hepatic GERD  ,  Endo/Other  diabetes, Type 2  Renal/GU Renal InsufficiencyRenal disease     Musculoskeletal  (+) Arthritis ,   Abdominal   Peds  Hematology   Anesthesia Other Findings   Reproductive/Obstetrics                             Anesthesia Physical Anesthesia Plan  ASA: III  Anesthesia Plan: General   Post-op Pain Management:    Induction: Intravenous  PONV Risk Score and Plan: 2 and Ondansetron, Midazolam and Treatment may vary due to age or medical condition  Airway Management Planned: LMA  Additional Equipment:   Intra-op Plan:   Post-operative Plan:   Informed Consent: I have reviewed the patients History and Physical, chart, labs and discussed the procedure including the risks, benefits and alternatives for the proposed anesthesia with the patient or authorized representative who has indicated his/her understanding and acceptance.     Plan Discussed with: CRNA, Anesthesiologist and Surgeon  Anesthesia Plan Comments:         Anesthesia Quick Evaluation

## 2018-04-25 NOTE — Anesthesia Postprocedure Evaluation (Signed)
Anesthesia Post Note  Patient: Gregory [name]  Procedure(s) Performed: CYSTOSCOPY WITH RETROGRADE URETHROGRAM/ BALLOON DILATION (N/A )     Patient location during evaluation: PACU Anesthesia Type: General Level of consciousness: awake and alert Pain management: pain level controlled Vital Signs Assessment: post-procedure vital signs reviewed and stable Respiratory status: spontaneous breathing, nonlabored ventilation, respiratory function stable and patient connected to nasal cannula oxygen Cardiovascular status: blood pressure returned to baseline and stable Postop Assessment: no apparent nausea or vomiting Anesthetic complications: no    Last Vitals:  Vitals:   04/25/18 1200 04/25/18 1220  BP: 122/71 114/66  Pulse: 65 68  Resp: 16 16  Temp:    SpO2: 91% 95%    Last Pain:  Vitals:   04/25/18 1145  TempSrc:   PainSc: 0-No pain                 Cerina Leary S

## 2018-04-25 NOTE — Anesthesia Procedure Notes (Signed)
Procedure Name: LMA Insertion Date/Time: 04/25/2018 10:18 AM Performed by: Lind Covert, CRNA Pre-anesthesia Checklist: Patient identified, Emergency Drugs available, Suction available, Patient being monitored and Timeout performed Patient Re-evaluated:Patient Re-evaluated prior to induction Oxygen Delivery Method: Circle system utilized Preoxygenation: Pre-oxygenation with 100% oxygen Induction Type: IV induction LMA: LMA inserted LMA Size: 5.0 Number of attempts: 1 Placement Confirmation: positive ETCO2 and breath sounds checked- equal and bilateral Tube secured with: Tape Dental Injury: Teeth and Oropharynx as per pre-operative assessment

## 2018-04-26 ENCOUNTER — Encounter (HOSPITAL_COMMUNITY): Payer: Self-pay | Admitting: Urology

## 2018-06-03 ENCOUNTER — Encounter (HOSPITAL_COMMUNITY): Payer: Self-pay

## 2018-06-03 ENCOUNTER — Inpatient Hospital Stay (HOSPITAL_COMMUNITY)
Admission: EM | Admit: 2018-06-03 | Discharge: 2018-06-09 | DRG: 853 | Disposition: A | Discharge status: Skilled Nursing Facility | Payer: Medicaid Other | Attending: Internal Medicine | Admitting: Internal Medicine

## 2018-06-03 ENCOUNTER — Other Ambulatory Visit: Payer: Self-pay

## 2018-06-03 ENCOUNTER — Emergency Department (HOSPITAL_COMMUNITY): Payer: Medicaid Other

## 2018-06-03 DIAGNOSIS — G9009 Other idiopathic peripheral autonomic neuropathy: Secondary | ICD-10-CM | POA: Diagnosis present

## 2018-06-03 DIAGNOSIS — L03116 Cellulitis of left lower limb: Secondary | ICD-10-CM | POA: Diagnosis present

## 2018-06-03 DIAGNOSIS — N319 Neuromuscular dysfunction of bladder, unspecified: Secondary | ICD-10-CM | POA: Diagnosis present

## 2018-06-03 DIAGNOSIS — Z981 Arthrodesis status: Secondary | ICD-10-CM

## 2018-06-03 DIAGNOSIS — R6520 Severe sepsis without septic shock: Secondary | ICD-10-CM

## 2018-06-03 DIAGNOSIS — E1149 Type 2 diabetes mellitus with other diabetic neurological complication: Secondary | ICD-10-CM | POA: Diagnosis present

## 2018-06-03 DIAGNOSIS — E861 Hypovolemia: Secondary | ICD-10-CM | POA: Diagnosis present

## 2018-06-03 DIAGNOSIS — Z833 Family history of diabetes mellitus: Secondary | ICD-10-CM

## 2018-06-03 DIAGNOSIS — Z89422 Acquired absence of other left toe(s): Secondary | ICD-10-CM | POA: Diagnosis not present

## 2018-06-03 DIAGNOSIS — E43 Unspecified severe protein-calorie malnutrition: Secondary | ICD-10-CM | POA: Diagnosis present

## 2018-06-03 DIAGNOSIS — Z22322 Carrier or suspected carrier of Methicillin resistant Staphylococcus aureus: Secondary | ICD-10-CM

## 2018-06-03 DIAGNOSIS — G4700 Insomnia, unspecified: Secondary | ICD-10-CM | POA: Diagnosis present

## 2018-06-03 DIAGNOSIS — E1122 Type 2 diabetes mellitus with diabetic chronic kidney disease: Secondary | ICD-10-CM | POA: Diagnosis present

## 2018-06-03 DIAGNOSIS — M21372 Foot drop, left foot: Secondary | ICD-10-CM | POA: Diagnosis present

## 2018-06-03 DIAGNOSIS — J449 Chronic obstructive pulmonary disease, unspecified: Secondary | ICD-10-CM | POA: Diagnosis present

## 2018-06-03 DIAGNOSIS — L8993 Pressure ulcer of unspecified site, stage 3: Secondary | ICD-10-CM

## 2018-06-03 DIAGNOSIS — G894 Chronic pain syndrome: Secondary | ICD-10-CM

## 2018-06-03 DIAGNOSIS — Z87891 Personal history of nicotine dependence: Secondary | ICD-10-CM

## 2018-06-03 DIAGNOSIS — F339 Major depressive disorder, recurrent, unspecified: Secondary | ICD-10-CM | POA: Diagnosis present

## 2018-06-03 DIAGNOSIS — E785 Hyperlipidemia, unspecified: Secondary | ICD-10-CM | POA: Diagnosis present

## 2018-06-03 DIAGNOSIS — M21371 Foot drop, right foot: Secondary | ICD-10-CM | POA: Diagnosis present

## 2018-06-03 DIAGNOSIS — M009 Pyogenic arthritis, unspecified: Secondary | ICD-10-CM

## 2018-06-03 DIAGNOSIS — E860 Dehydration: Secondary | ICD-10-CM | POA: Diagnosis present

## 2018-06-03 DIAGNOSIS — G8929 Other chronic pain: Secondary | ICD-10-CM | POA: Diagnosis present

## 2018-06-03 DIAGNOSIS — Z960 Presence of urogenital implants: Secondary | ICD-10-CM | POA: Diagnosis not present

## 2018-06-03 DIAGNOSIS — E1169 Type 2 diabetes mellitus with other specified complication: Secondary | ICD-10-CM | POA: Diagnosis present

## 2018-06-03 DIAGNOSIS — M86272 Subacute osteomyelitis, left ankle and foot: Secondary | ICD-10-CM

## 2018-06-03 DIAGNOSIS — K219 Gastro-esophageal reflux disease without esophagitis: Secondary | ICD-10-CM | POA: Diagnosis present

## 2018-06-03 DIAGNOSIS — B954 Other streptococcus as the cause of diseases classified elsewhere: Secondary | ICD-10-CM | POA: Diagnosis not present

## 2018-06-03 DIAGNOSIS — M869 Osteomyelitis, unspecified: Secondary | ICD-10-CM

## 2018-06-03 DIAGNOSIS — I130 Hypertensive heart and chronic kidney disease with heart failure and stage 1 through stage 4 chronic kidney disease, or unspecified chronic kidney disease: Secondary | ICD-10-CM | POA: Diagnosis present

## 2018-06-03 DIAGNOSIS — N179 Acute kidney failure, unspecified: Secondary | ICD-10-CM | POA: Diagnosis present

## 2018-06-03 DIAGNOSIS — I5032 Chronic diastolic (congestive) heart failure: Secondary | ICD-10-CM | POA: Diagnosis present

## 2018-06-03 DIAGNOSIS — R21 Rash and other nonspecific skin eruption: Secondary | ICD-10-CM | POA: Diagnosis not present

## 2018-06-03 DIAGNOSIS — E669 Obesity, unspecified: Secondary | ICD-10-CM | POA: Diagnosis present

## 2018-06-03 DIAGNOSIS — M86172 Other acute osteomyelitis, left ankle and foot: Secondary | ICD-10-CM

## 2018-06-03 DIAGNOSIS — Z8614 Personal history of Methicillin resistant Staphylococcus aureus infection: Secondary | ICD-10-CM

## 2018-06-03 DIAGNOSIS — E440 Moderate protein-calorie malnutrition: Secondary | ICD-10-CM

## 2018-06-03 DIAGNOSIS — Z794 Long term (current) use of insulin: Secondary | ICD-10-CM

## 2018-06-03 DIAGNOSIS — L02612 Cutaneous abscess of left foot: Secondary | ICD-10-CM

## 2018-06-03 DIAGNOSIS — Z91040 Latex allergy status: Secondary | ICD-10-CM | POA: Diagnosis not present

## 2018-06-03 DIAGNOSIS — Z79899 Other long term (current) drug therapy: Secondary | ICD-10-CM

## 2018-06-03 DIAGNOSIS — Z89412 Acquired absence of left great toe: Secondary | ICD-10-CM | POA: Diagnosis not present

## 2018-06-03 DIAGNOSIS — N183 Chronic kidney disease, stage 3 (moderate): Secondary | ICD-10-CM | POA: Diagnosis present

## 2018-06-03 DIAGNOSIS — L03032 Cellulitis of left toe: Secondary | ICD-10-CM | POA: Diagnosis not present

## 2018-06-03 DIAGNOSIS — Z6834 Body mass index (BMI) 34.0-34.9, adult: Secondary | ICD-10-CM

## 2018-06-03 DIAGNOSIS — F419 Anxiety disorder, unspecified: Secondary | ICD-10-CM | POA: Diagnosis present

## 2018-06-03 DIAGNOSIS — F32A Depression, unspecified: Secondary | ICD-10-CM | POA: Diagnosis present

## 2018-06-03 DIAGNOSIS — Z978 Presence of other specified devices: Secondary | ICD-10-CM | POA: Diagnosis not present

## 2018-06-03 DIAGNOSIS — L8990 Pressure ulcer of unspecified site, unspecified stage: Secondary | ICD-10-CM

## 2018-06-03 DIAGNOSIS — F39 Unspecified mood [affective] disorder: Secondary | ICD-10-CM | POA: Diagnosis present

## 2018-06-03 DIAGNOSIS — D72829 Elevated white blood cell count, unspecified: Secondary | ICD-10-CM | POA: Diagnosis not present

## 2018-06-03 DIAGNOSIS — Z85820 Personal history of malignant melanoma of skin: Secondary | ICD-10-CM

## 2018-06-03 DIAGNOSIS — A419 Sepsis, unspecified organism: Secondary | ICD-10-CM | POA: Diagnosis present

## 2018-06-03 DIAGNOSIS — D638 Anemia in other chronic diseases classified elsewhere: Secondary | ICD-10-CM | POA: Diagnosis present

## 2018-06-03 DIAGNOSIS — Z89511 Acquired absence of right leg below knee: Secondary | ICD-10-CM | POA: Diagnosis not present

## 2018-06-03 DIAGNOSIS — A491 Streptococcal infection, unspecified site: Secondary | ICD-10-CM

## 2018-06-03 DIAGNOSIS — A4101 Sepsis due to Methicillin susceptible Staphylococcus aureus: Secondary | ICD-10-CM | POA: Diagnosis not present

## 2018-06-03 LAB — CBC WITH DIFFERENTIAL/PLATELET
ABS IMMATURE GRANULOCYTES: 0.49 10*3/uL — AB (ref 0.00–0.07)
Basophils Absolute: 0 10*3/uL (ref 0.0–0.1)
Basophils Relative: 0 %
EOS PCT: 0 %
Eosinophils Absolute: 0 10*3/uL (ref 0.0–0.5)
HEMATOCRIT: 28.6 % — AB (ref 39.0–52.0)
Hemoglobin: 8.7 g/dL — ABNORMAL LOW (ref 13.0–17.0)
Immature Granulocytes: 3 %
LYMPHS ABS: 0.7 10*3/uL (ref 0.7–4.0)
LYMPHS PCT: 5 %
MCH: 27.6 pg (ref 26.0–34.0)
MCHC: 30.4 g/dL (ref 30.0–36.0)
MCV: 90.8 fL (ref 80.0–100.0)
MONO ABS: 0.7 10*3/uL (ref 0.1–1.0)
MONOS PCT: 5 %
NEUTROS ABS: 13 10*3/uL — AB (ref 1.7–7.7)
Neutrophils Relative %: 87 %
Platelets: 178 10*3/uL (ref 150–400)
RBC: 3.15 MIL/uL — ABNORMAL LOW (ref 4.22–5.81)
RDW: 16.2 % — ABNORMAL HIGH (ref 11.5–15.5)
WBC: 14.9 10*3/uL — ABNORMAL HIGH (ref 4.0–10.5)
nRBC: 0 % (ref 0.0–0.2)

## 2018-06-03 LAB — URINALYSIS, ROUTINE W REFLEX MICROSCOPIC
Bilirubin Urine: NEGATIVE
GLUCOSE, UA: NEGATIVE mg/dL
HGB URINE DIPSTICK: NEGATIVE
Ketones, ur: NEGATIVE mg/dL
NITRITE: NEGATIVE
PH: 5.5 (ref 5.0–8.0)
SPECIFIC GRAVITY, URINE: 1.015 (ref 1.005–1.030)

## 2018-06-03 LAB — COMPREHENSIVE METABOLIC PANEL
ALK PHOS: 114 U/L (ref 38–126)
ALT: 13 U/L (ref 0–44)
ANION GAP: 13 (ref 5–15)
AST: 15 U/L (ref 15–41)
Albumin: 2.8 g/dL — ABNORMAL LOW (ref 3.5–5.0)
BILIRUBIN TOTAL: 0.9 mg/dL (ref 0.3–1.2)
BUN: 66 mg/dL — ABNORMAL HIGH (ref 6–20)
CO2: 20 mmol/L — AB (ref 22–32)
CREATININE: 3.24 mg/dL — AB (ref 0.61–1.24)
Calcium: 8.4 mg/dL — ABNORMAL LOW (ref 8.9–10.3)
Chloride: 100 mmol/L (ref 98–111)
GFR calc non Af Amer: 20 mL/min — ABNORMAL LOW (ref >60)
GFR, EST AFRICAN AMERICAN: 23 mL/min — AB (ref >60)
GLUCOSE: 170 mg/dL — AB (ref 70–99)
Potassium: 4.8 mmol/L (ref 3.5–5.1)
SODIUM: 133 mmol/L — AB (ref 135–145)
TOTAL PROTEIN: 6.6 g/dL (ref 6.5–8.1)

## 2018-06-03 LAB — I-STAT CHEM 8, ED
BUN: 60 mg/dL — AB (ref 6–20)
CALCIUM ION: 1.1 mmol/L — AB (ref 1.15–1.40)
CHLORIDE: 104 mmol/L (ref 98–111)
CREATININE: 3.3 mg/dL — AB (ref 0.61–1.24)
GLUCOSE: 174 mg/dL — AB (ref 70–99)
HCT: 27 % — ABNORMAL LOW (ref 39.0–52.0)
Hemoglobin: 9.2 g/dL — ABNORMAL LOW (ref 13.0–17.0)
POTASSIUM: 4.8 mmol/L (ref 3.5–5.1)
Sodium: 136 mmol/L (ref 135–145)
TCO2: 22 mmol/L (ref 22–32)

## 2018-06-03 LAB — GLUCOSE, CAPILLARY: GLUCOSE-CAPILLARY: 192 mg/dL — AB (ref 70–99)

## 2018-06-03 LAB — CBG MONITORING, ED: Glucose-Capillary: 154 mg/dL — ABNORMAL HIGH (ref 70–99)

## 2018-06-03 LAB — I-STAT CG4 LACTIC ACID, ED: Lactic Acid, Venous: 1.02 mmol/L (ref 0.5–1.9)

## 2018-06-03 MED ORDER — PIPERACILLIN-TAZOBACTAM 3.375 G IVPB 30 MIN
3.3750 g | Freq: Once | INTRAVENOUS | Status: AC
  Administered 2018-06-03: 3.375 g via INTRAVENOUS
  Filled 2018-06-03: qty 50

## 2018-06-03 MED ORDER — MORPHINE SULFATE (PF) 2 MG/ML IV SOLN
2.0000 mg | Freq: Once | INTRAVENOUS | Status: AC
  Administered 2018-06-03: 2 mg via INTRAVENOUS
  Filled 2018-06-03: qty 1

## 2018-06-03 MED ORDER — TAMSULOSIN HCL 0.4 MG PO CAPS
0.8000 mg | ORAL_CAPSULE | Freq: Every day | ORAL | Status: DC
  Administered 2018-06-03 – 2018-06-08 (×6): 0.8 mg via ORAL
  Filled 2018-06-03 (×6): qty 2

## 2018-06-03 MED ORDER — ALBUTEROL SULFATE (2.5 MG/3ML) 0.083% IN NEBU: 2.5000 mg | INHALATION_SOLUTION | RESPIRATORY_TRACT | Status: DC | PRN

## 2018-06-03 MED ORDER — PIPERACILLIN-TAZOBACTAM 3.375 G IVPB
3.3750 g | Freq: Three times a day (TID) | INTRAVENOUS | Status: DC
  Administered 2018-06-03 – 2018-06-04 (×2): 3.375 g via INTRAVENOUS
  Filled 2018-06-03 (×2): qty 50

## 2018-06-03 MED ORDER — SODIUM CHLORIDE 0.9 % IV BOLUS: 1000.0000 mL | Freq: Once | INTRAVENOUS | Status: DC

## 2018-06-03 MED ORDER — OXYCODONE HCL 5 MG PO TABS
15.0000 mg | ORAL_TABLET | Freq: Four times a day (QID) | ORAL | Status: DC | PRN
  Administered 2018-06-04 – 2018-06-09 (×13): 15 mg via ORAL
  Filled 2018-06-03 (×14): qty 3

## 2018-06-03 MED ORDER — IPRATROPIUM-ALBUTEROL 0.5-2.5 (3) MG/3ML IN SOLN
3.0000 mL | Freq: Four times a day (QID) | RESPIRATORY_TRACT | Status: DC
  Administered 2018-06-04 (×2): 3 mL via RESPIRATORY_TRACT
  Filled 2018-06-03 (×3): qty 3

## 2018-06-03 MED ORDER — ACETAMINOPHEN 650 MG RE SUPP: 650.0000 mg | Freq: Four times a day (QID) | RECTAL | Status: DC | PRN

## 2018-06-03 MED ORDER — VANCOMYCIN HCL IN DEXTROSE 1-5 GM/200ML-% IV SOLN: 1000.0000 mg | Freq: Once | INTRAVENOUS | Status: DC

## 2018-06-03 MED ORDER — ZOLPIDEM TARTRATE 5 MG PO TABS
5.0000 mg | ORAL_TABLET | Freq: Every evening | ORAL | Status: DC | PRN
  Administered 2018-06-04 – 2018-06-08 (×5): 5 mg via ORAL
  Filled 2018-06-03 (×5): qty 1

## 2018-06-03 MED ORDER — ONDANSETRON HCL 4 MG/2ML IJ SOLN
4.0000 mg | Freq: Four times a day (QID) | INTRAMUSCULAR | Status: DC | PRN
  Administered 2018-06-06: 4 mg via INTRAVENOUS
  Filled 2018-06-03: qty 2

## 2018-06-03 MED ORDER — LORATADINE 10 MG PO TABS
10.0000 mg | ORAL_TABLET | Freq: Every day | ORAL | Status: DC
  Administered 2018-06-04 – 2018-06-09 (×5): 10 mg via ORAL
  Filled 2018-06-03 (×6): qty 1

## 2018-06-03 MED ORDER — MENTHOL 3 MG MT LOZG
1.0000 | LOZENGE | OROMUCOSAL | Status: DC | PRN
  Administered 2018-06-04: 3 mg via ORAL
  Filled 2018-06-03: qty 9

## 2018-06-03 MED ORDER — INSULIN ASPART 100 UNIT/ML ~~LOC~~ SOLN
0.0000 [IU] | Freq: Three times a day (TID) | SUBCUTANEOUS | Status: DC
  Administered 2018-06-05: 8 [IU] via SUBCUTANEOUS
  Administered 2018-06-06 – 2018-06-07 (×3): 3 [IU] via SUBCUTANEOUS
  Administered 2018-06-07: 5 [IU] via SUBCUTANEOUS
  Administered 2018-06-08: 2 [IU] via SUBCUTANEOUS
  Administered 2018-06-08: 3 [IU] via SUBCUTANEOUS
  Administered 2018-06-08: 5 [IU] via SUBCUTANEOUS
  Administered 2018-06-09: 2 [IU] via SUBCUTANEOUS

## 2018-06-03 MED ORDER — PREDNISONE 10 MG PO TABS
10.0000 mg | ORAL_TABLET | Freq: Every day | ORAL | Status: DC
  Administered 2018-06-04 – 2018-06-05 (×2): 10 mg via ORAL
  Filled 2018-06-03 (×2): qty 1

## 2018-06-03 MED ORDER — BUPROPION HCL ER (XL) 150 MG PO TB24
300.0000 mg | ORAL_TABLET | Freq: Every day | ORAL | Status: DC
  Administered 2018-06-03 – 2018-06-08 (×6): 300 mg via ORAL
  Filled 2018-06-03 (×6): qty 2

## 2018-06-03 MED ORDER — VANCOMYCIN HCL 10 G IV SOLR
1250.0000 mg | INTRAVENOUS | Status: DC
  Administered 2018-06-05: 1250 mg via INTRAVENOUS
  Filled 2018-06-03: qty 1250

## 2018-06-03 MED ORDER — VITAMIN B-12 1000 MCG PO TABS
1000.0000 ug | ORAL_TABLET | Freq: Every day | ORAL | Status: DC
  Administered 2018-06-04 – 2018-06-09 (×5): 1000 ug via ORAL
  Filled 2018-06-03 (×5): qty 1

## 2018-06-03 MED ORDER — ONDANSETRON HCL 4 MG PO TABS: 4.0000 mg | ORAL_TABLET | Freq: Four times a day (QID) | ORAL | Status: DC | PRN

## 2018-06-03 MED ORDER — METOPROLOL TARTRATE 25 MG PO TABS
25.0000 mg | ORAL_TABLET | Freq: Every day | ORAL | Status: DC
  Administered 2018-06-04 – 2018-06-09 (×6): 25 mg via ORAL
  Filled 2018-06-03 (×6): qty 1

## 2018-06-03 MED ORDER — PREGABALIN 75 MG PO CAPS
150.0000 mg | ORAL_CAPSULE | Freq: Two times a day (BID) | ORAL | Status: DC
  Administered 2018-06-03 – 2018-06-09 (×12): 150 mg via ORAL
  Filled 2018-06-03 (×2): qty 6
  Filled 2018-06-03 (×10): qty 2

## 2018-06-03 MED ORDER — PAROXETINE HCL 20 MG PO TABS
20.0000 mg | ORAL_TABLET | Freq: Every day | ORAL | Status: DC
  Administered 2018-06-03 – 2018-06-08 (×6): 20 mg via ORAL
  Filled 2018-06-03 (×6): qty 1

## 2018-06-03 MED ORDER — GUAIFENESIN ER 600 MG PO TB12
600.0000 mg | ORAL_TABLET | Freq: Two times a day (BID) | ORAL | Status: DC | PRN
  Administered 2018-06-03 – 2018-06-05 (×2): 600 mg via ORAL
  Filled 2018-06-03 (×2): qty 1

## 2018-06-03 MED ORDER — IPRATROPIUM-ALBUTEROL 0.5-2.5 (3) MG/3ML IN SOLN
3.0000 mL | Freq: Once | RESPIRATORY_TRACT | Status: DC
  Filled 2018-06-03: qty 3

## 2018-06-03 MED ORDER — TIZANIDINE HCL 2 MG PO TABS
2.0000 mg | ORAL_TABLET | Freq: Three times a day (TID) | ORAL | Status: DC | PRN
  Administered 2018-06-05 – 2018-06-09 (×5): 2 mg via ORAL
  Filled 2018-06-03 (×5): qty 1

## 2018-06-03 MED ORDER — SENNOSIDES-DOCUSATE SODIUM 8.6-50 MG PO TABS
2.0000 | ORAL_TABLET | Freq: Every day | ORAL | Status: DC
  Administered 2018-06-03 – 2018-06-07 (×5): 2 via ORAL
  Filled 2018-06-03 (×5): qty 2

## 2018-06-03 MED ORDER — MORPHINE SULFATE ER 15 MG PO TBCR
15.0000 mg | EXTENDED_RELEASE_TABLET | Freq: Two times a day (BID) | ORAL | Status: DC
  Administered 2018-06-03 – 2018-06-09 (×12): 15 mg via ORAL
  Filled 2018-06-03 (×12): qty 1

## 2018-06-03 MED ORDER — SODIUM CHLORIDE 0.9 % IV SOLN
INTRAVENOUS | Status: AC
  Administered 2018-06-03 – 2018-06-04 (×2): via INTRAVENOUS

## 2018-06-03 MED ORDER — FINASTERIDE 5 MG PO TABS
5.0000 mg | ORAL_TABLET | Freq: Every day | ORAL | Status: DC
  Administered 2018-06-04 – 2018-06-09 (×5): 5 mg via ORAL
  Filled 2018-06-03 (×6): qty 1

## 2018-06-03 MED ORDER — VANCOMYCIN HCL 10 G IV SOLR
2000.0000 mg | Freq: Once | INTRAVENOUS | Status: AC
  Administered 2018-06-03: 2000 mg via INTRAVENOUS
  Filled 2018-06-03: qty 2000

## 2018-06-03 MED ORDER — ATORVASTATIN CALCIUM 10 MG PO TABS
20.0000 mg | ORAL_TABLET | Freq: Every day | ORAL | Status: DC
  Administered 2018-06-03 – 2018-06-08 (×6): 20 mg via ORAL
  Filled 2018-06-03 (×6): qty 2

## 2018-06-03 MED ORDER — ACETAMINOPHEN 325 MG PO TABS
650.0000 mg | ORAL_TABLET | Freq: Four times a day (QID) | ORAL | Status: DC | PRN
  Administered 2018-06-06 – 2018-06-09 (×4): 650 mg via ORAL
  Filled 2018-06-03 (×4): qty 2

## 2018-06-03 MED ORDER — PREGABALIN 75 MG PO CAPS: 75.0000 mg | ORAL_CAPSULE | Freq: Two times a day (BID) | ORAL | Status: DC

## 2018-06-03 NOTE — ED Provider Notes (Signed)
[facility] DEPT Provider Note   CSN: [id number] Arrival date & time: 06/03/18  1309     History   Chief Complaint Chief Complaint  Patient presents with  . Wound Infection    HPI Gregory [name] is a 57 y.o. male with medical history significant for osteomyelitis, epidural abscess, diabetes, CKD stage III, CHF, MRSA presenting today from nursing facility for 72-hour history of left foot pain and drainage.  Per EMS report patient was started on azithromycin and Levaquin at nursing facility and after worsening of condition was sent to this ER.  On arrival patient is alert and oriented however aggravated.  He reports left foot pain that is constant, severe, aching/burning and worsened with movement/palpation.  Patient states that the antibiotics given to him at nursing facility have been unhelpful with his condition.  Patient noted to be tachycardic, fever of 100.2 F.  He denies history of fever, chills, chest pain/shortness of breath, nausea/vomiting, abdominal pain, headache/confusion or additional symptoms aside from his left foot pain.  HPI  Past Medical History:  Diagnosis Date  . Acquired absence of left great toe ([place])   . Acquired absence of right leg below knee ([place])   . Acute osteomyelitis of calcaneum, right ([place]) 10/02/2016  . Acute respiratory failure ([place])   . Anemia   . Basal cell carcinoma, eyelid   . Cancer ([place])   . CHF (congestive heart failure) (HCC)    chronic diastolic   . Chronic back pain   . Chronic kidney disease    stage 3   . Chronic multifocal osteomyelitis (HCC)    of ankle and foot   . Chronic pain syndrome   . COPD (chronic obstructive pulmonary disease) ([place])   . DDD (degenerative disc disease), lumbar   . Depression    major depressive disorder   . Diabetes mellitus without complication ([place])    type 2   . Diabetic ulcer of toe of left foot ([place]) 10/02/2016  . Difficulty in walking, not elsewhere classified   .  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 Foot drop, bilateral   . Foot drop, right 12/27/2015  . GERD (gastroesophageal reflux disease)   . Herpesviral vesicular dermatitis   . Hyperlipidemia   . Hyperlipidemia   . Hypertension   . Insomnia   . Malignant melanoma of other parts of face ([place])   . MRSA bacteremia   . Muscle weakness (generalized)   . Nasal congestion   . Neuromuscular dysfunction of bladder   . Osteomyelitis ([place])   . Osteomyelitis ([place])   . Other idiopathic peripheral autonomic neuropathy   . Retention of urine, unspecified   . Squamous cell carcinoma of skin   . Unsteadiness on feet   . Urinary retention   . Urinary retention   . Urinary tract infection     Patient Active Problem List   Diagnosis Date Noted  . Idiopathic chronic venous hypertension of left lower extremity with ulcer and inflammation ([place]) 11/14/2017  . Great toe amputation status, left 11/14/2017  . Acquired absence of right leg below knee ([place]) 06/20/2017  . Osteomyelitis ([place]) 03/25/2017  . Skin cancer 03/25/2017  . Depression, recurrent ([place]) 11/05/2016  . Diabetic ulcer of toe of left foot ([place]) 10/02/2016  . 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 Chronic pain 09/27/2016  . Dyslipidemia associated with type 2 diabetes mellitus ([place]) 06/06/2016  . GERD without esophagitis 05/11/2016  . Hypertensive heart disease with congestive heart failure ([place]) 12/15/2015  . Spinal  stenosis in cervical region 12/15/2015  . Type II diabetes mellitus with neurological manifestations ([place]) 12/15/2015  . Chronic diastolic CHF (congestive heart failure) ([place])   . Pulmonary hypertension ([place])   . Urinary retention   . Foot drop, bilateral   . MRSA bacteremia 10/03/2015  . COPD (chronic obstructive pulmonary disease) ([place]) 10/03/2015    Past Surgical History:  Procedure Laterality Date  . AMPUTATION Left 03/29/2017   Procedure: LEFT GREAT TOE AMPUTATION AT METATARSOPHALANGEAL JOINT;  Surgeon: [name], MD;   Location: [facility];  Service: Orthopedics;  Laterality: Left;  . AMPUTATION Right 03/29/2017   Procedure: RIGHT BELOW KNEE AMPUTATION;  Surgeon: [name], MD;  Location: [facility];  Service: Orthopedics;  Laterality: Right;  . ANTERIOR CERVICAL CORPECTOMY N/A 11/25/2015   Procedure: ANTERIOR CERVICAL FIVE CORPECTOMY Cervical four - six fusion;  Surgeon: Consuella Lose, MD;  Location: [facility];  Service: Neurosurgery;  Laterality: N/A;  ANTERIOR CERVICAL FIVE CORPECTOMY Cervical four - six fusion  . CYSTOSCOPY WITH RETROGRADE URETHROGRAM N/A 04/25/2018   Procedure: CYSTOSCOPY WITH RETROGRADE URETHROGRAM/ BALLOON DILATION;  Surgeon: Kathie Rhodes, MD;  Location: [facility];  Service: Urology;  Laterality: N/A;  . POSTERIOR CERVICAL FUSION/FORAMINOTOMY N/A 11/29/2015   Procedure: Cervical Three-Cervical Seven Posterior Cervical Laminectomy with Fusion;  Surgeon: Consuella Lose, MD;  Location: [facility];  Service: Neurosurgery;  Laterality: N/A;  Cervical Three-Cervical Seven Posterior Cervical Laminectomy with Fusion        Home Medications    Prior to Admission medications   Medication Sig Start Date End Date Taking? Authorizing Provider  acetaminophen (TYLENOL) 325 MG tablet Take 650 mg by mouth every 4 (four) hours as needed for moderate pain.   Yes [provider]  albuterol (PROVENTIL HFA;VENTOLIN HFA) 108 (90 Base) MCG/ACT inhaler Inhale 2 puffs into the lungs every 6 (six) hours as needed for wheezing or shortness of breath.   Yes [provider]  antiseptic oral rinse (BIOTENE) LIQD 15 mLs by Mouth Rinse route every 4 (four) hours as needed for dry mouth.    Yes [provider]  atorvastatin (LIPITOR) 20 MG tablet Take 20 mg by mouth at bedtime.   Yes [provider]  azithromycin (ZITHROMAX) 250 MG tablet Take 250 mg by mouth daily. 06/03/18 06/07/18 Yes [provider]  Bacitracin-Polymyxin B (POLY BACITRACIN) 500-10000 UNIT/GM OINT Place  1 application into the right eye every 6 (six) hours. 05/29/18 06/04/18 Yes [provider]  bisacodyl (DULCOLAX) 10 MG suppository Place 1 suppository (10 mg total) rectally daily as needed for moderate constipation. 12/02/15  Yes [name], MD  buPROPion (WELLBUTRIN XL) 300 MG 24 hr tablet Take 300 mg by mouth at bedtime.   Yes [provider]  calcium carbonate (TUMS - DOSED IN MG ELEMENTAL CALCIUM) 500 MG chewable tablet Chew 2 tablets by mouth every 8 (eight) hours as needed for indigestion or heartburn.   Yes [provider]  cephALEXin (KEFLEX) 500 MG capsule Take 500 mg by mouth every 12 (twelve) hours. 06/02/18 06/09/18 Yes [provider]  cetirizine (ZYRTEC) 10 MG tablet Take 10 mg by mouth daily.   Yes [provider]  Cholecalciferol (VITAMIN D-3) 5000 units TABS Take 5,000 Units by mouth daily.   Yes [provider]  diphenhydrAMINE (BENADRYL) 25 MG tablet Take 1 tablet (25 mg total) by mouth every 6 (six) hours. 02/23/17  Yes Jola Schmidt, MD  eszopiclone (LUNESTA) 2 MG TABS tablet Take 2 mg by  mouth at bedtime. Take immediately before bedtime   Yes [provider]  finasteride (PROSCAR) 5 MG tablet Take 5 mg by mouth daily.   Yes [provider]  furosemide (LASIX) 20 MG tablet Take 20 mg by mouth daily.    Yes [provider]  gemfibrozil (LOPID) 600 MG tablet Take 600 mg by mouth every 12 (twelve) hours.   Yes [provider]  glipiZIDE (GLUCOTROL) 10 MG tablet Take 10 mg by mouth daily before breakfast.   Yes [provider]  glucagon (GLUCAGON EMERGENCY) 1 MG injection Inject 1 mg into the muscle once as needed (for CBG <70).    Yes [provider]  guaiFENesin (MUCINEX) 600 MG 12 hr tablet Take 600 mg by mouth every 12 (twelve) hours as needed for cough (congestion).  04/23/18  Yes [provider]  guaiFENesin (MUCINEX) 600 MG 12 hr tablet Take 600 mg by mouth 2 (two)  times daily. 05/27/18 06/06/18 Yes [provider]  insulin lispro (HUMALOG KWIKPEN) 100 UNIT/ML KwikPen Inject 0-10 Units into the skin See admin instructions. Inject per sliding scale 3 times daily before meals; CBG <60 = notify MD, 0-150 = 0 units, 151-200 = 2 units, 201-250 = 4 units, 251-300 = 6 units, 301-350 = 8 units, 351-400 = 10 units, >400 = notify MD   Yes [provider]  ipratropium-albuterol (DUONEB) 0.5-2.5 (3) MG/3ML SOLN Take 3 mLs by nebulization every 6 (six) hours.  06/02/18 06/07/18 Yes [provider]  ipratropium-albuterol (DUONEB) 0.5-2.5 (3) MG/3ML SOLN Take 3 mLs by nebulization every 6 (six) hours as needed (congestion/SOB).   Yes [provider]  lidocaine (LIDODERM) 5 % Place 1 patch onto the skin daily. Remove & Discard patch within 12 hours or as directed by MD   Yes [provider]  loratadine (CLARITIN) 10 MG tablet Take 10 mg by mouth daily. 06/02/18 06/09/18 Yes [provider]  magnesium oxide (MAG-OX) 400 MG tablet Take 400 mg by mouth 2 (two) times daily.   Yes [provider]  metoprolol tartrate (LOPRESSOR) 25 MG tablet Take 25 mg by mouth daily.   Yes [provider]  mometasone (NASONEX) 50 MCG/ACT nasal spray Place 1 spray into the nose daily.   Yes [provider]  morphine (MS CONTIN) 15 MG 12 hr tablet Take 15 mg by mouth every 12 (twelve) hours.   Yes [provider]  omeprazole (PRILOSEC) 20 MG capsule Take 40 mg by mouth daily.    Yes [provider]  ondansetron (ZOFRAN) 4 MG tablet Take 4 mg by mouth every 6 (six) hours as needed for nausea or vomiting.   Yes [provider]  oxybutynin (DITROPAN XL) 15 MG 24 hr tablet Take 15 mg by mouth at bedtime.   Yes [provider]  oxyCODONE (ROXICODONE) 15 MG immediate release tablet Take 15 mg by mouth every 6 (six) hours as needed for pain.   Yes [provider]  PARoxetine (PAXIL)  20 MG tablet Take 20 mg by mouth at bedtime.   Yes [provider]  predniSONE (DELTASONE) 20 MG tablet Take 40 mg by mouth daily with breakfast. 06/03/18 06/08/18 Yes [provider]  pregabalin (LYRICA) 150 MG capsule Take 150 mg by mouth 2 (two) times daily.   Yes [provider]  senna-docusate (SENOKOT-S) 8.6-50 MG tablet Take 2 tablets by mouth at bedtime.   Yes [provider]  tamsulosin (FLOMAX) 0.4 MG CAPS capsule Take 0.8  mg by mouth every evening.  11/26/13  Yes [provider]  tiZANidine (ZANAFLEX) 2 MG tablet Take 2 mg by mouth every 8 (eight) hours as needed for muscle spasms.   Yes [provider]  vitamin B-12 (CYANOCOBALAMIN) 1000 MCG tablet Take 1,000 mcg by mouth daily.   Yes [provider]    Family History Family History  Problem Relation Age of Onset  . Cancer Other   . Diabetes Other     Social History Social History   Tobacco Use  . Smoking status: Former Smoker    Packs/day: 3.00    Types: Cigarettes  . Smokeless tobacco: Never Used  Substance Use Topics  . Alcohol use: No  . Drug use: No     Allergies   Latex   Review of Systems Review of Systems  Constitutional: Positive for fatigue. Negative for chills and fever.  HENT: Negative.  Negative for rhinorrhea and sore throat.   Eyes: Negative.  Negative for visual disturbance.  Respiratory: Negative.  Negative for cough and shortness of breath.   Cardiovascular: Negative.  Negative for chest pain.  Gastrointestinal: Negative.  Negative for abdominal pain, diarrhea, nausea and vomiting.  Genitourinary: Positive for decreased urine volume. Negative for dysuria and hematuria.  Musculoskeletal: Negative.  Negative for arthralgias and myalgias.  Skin: Positive for color change and wound.  Neurological: Negative.  Negative for dizziness, weakness and headaches.   Physical Exam Updated Vital Signs BP (!) 130/107   Pulse 95   Temp 100.2  F (37.9 C) (Oral)   Resp 20   SpO2 92%   Physical Exam Constitutional:      General: He is not in acute distress.    Appearance: He is obese. He is ill-appearing.  HENT:     Head: Normocephalic and atraumatic.     Right Ear: External ear normal.     Left Ear: External ear normal.     Nose: Nose normal.     Mouth/Throat:     Mouth: Mucous membranes are moist.     Pharynx: Oropharynx is clear.  Eyes:     Extraocular Movements: Extraocular movements intact.     Conjunctiva/sclera: Conjunctivae normal.     Pupils: Pupils are equal, round, and reactive to light.  Neck:     Musculoskeletal: Normal range of motion and neck supple. No muscular tenderness.  Cardiovascular:     Rate and Rhythm: Regular rhythm. Tachycardia present.     Pulses:          Dorsalis pedis pulses are 1+ on the left side.       Posterior tibial pulses are 1+ on the left side.     Heart sounds: Normal heart sounds.  Pulmonary:     Effort: Pulmonary effort is normal.     Breath sounds: Rhonchi present. No wheezing.  Chest:     Chest wall: No tenderness.  Abdominal:     General: Bowel sounds are normal.     Tenderness: There is no abdominal tenderness. There is no guarding.  Feet:     Right foot:     Amputation: Right leg is amputated below knee.     Left foot:     Protective Sensation: 3 sites tested. 3 sites sensed.     Skin integrity: Ulcer, blister, skin breakdown, erythema and warmth present.     Comments: See picture below Skin:    General: Skin is warm and dry.     Capillary Refill: Capillary refill takes  less than 2 seconds.  Neurological:     General: No focal deficit present.     Mental Status: He is alert.     GCS: GCS eye subscore is 4. GCS verbal subscore is 5. GCS motor subscore is 6.  Psychiatric:        Mood and Affect: Mood normal.        Behavior: Behavior normal.          ED Treatments / Results  Labs (all labs ordered are listed, but only abnormal results are  displayed) Labs Reviewed  CBC WITH DIFFERENTIAL/PLATELET - Abnormal; Notable for the following components:      Result Value   WBC 14.9 (*)    RBC 3.15 (*)    Hemoglobin 8.7 (*)    HCT 28.6 (*)    RDW 16.2 (*)    Neutro Abs 13.0 (*)    Abs Immature Granulocytes 0.49 (*)    All other components within normal limits  COMPREHENSIVE METABOLIC PANEL - Abnormal; Notable for the following components:   Sodium 133 (*)    CO2 20 (*)    Glucose, Bld 170 (*)    BUN 66 (*)    Creatinine, Ser 3.24 (*)    Calcium 8.4 (*)    Albumin 2.8 (*)    GFR calc non Af Amer 20 (*)    GFR calc Af Amer 23 (*)    All other components within normal limits  I-STAT CHEM 8, ED - Abnormal; Notable for the following components:   BUN 60 (*)    Creatinine, Ser 3.30 (*)    Glucose, Bld 174 (*)    Calcium, Ion 1.10 (*)    Hemoglobin 9.2 (*)    HCT 27.0 (*)    All other components within normal limits  CBG MONITORING, ED - Abnormal; Notable for the following components:   Glucose-Capillary 154 (*)    All other components within normal limits  CULTURE, BLOOD (ROUTINE X 2)  CULTURE, BLOOD (ROUTINE X 2)  URINALYSIS, ROUTINE W REFLEX MICROSCOPIC  I-STAT CG4 LACTIC ACID, ED  I-STAT CG4 LACTIC ACID, ED    EKG None  Radiology Dg Chest 2 View  Result Date: 06/03/2018 CLINICAL DATA:  [facility] EXAM: CHEST - 2 VIEW COMPARISON:  11/26/2015 FINDINGS: Generalized interstitial coarsening without Kerley lines or effusion. Heart size is normal. No collapse or consolidation. IMPRESSION: Bronchitic markings without focal pneumonia. Electronically Signed   By: Monte Fantasia M.D.   On: 06/03/2018 14:47   Dg Tibia/fibula Left  Result Date: 06/03/2018 CLINICAL DATA:  Worsening left leg infection x3 days. EXAM: LEFT TIBIA AND FIBULA - 2 VIEW COMPARISON:  None. FINDINGS: Overlapping AP and lateral views of the left tibia and fibula are provided. Soft tissue nodularity is seen along the lateral aspect of the knee and leg with  moderate soft tissue swelling noted about the ankle. No acute fracture, bone destruction or suspicious osseous lesions. Osteoarthritic joint space narrowing of the medial femorotibial compartment with spurring of the tibial spine is identified. No joint effusion is seen. IMPRESSION: 1. Soft tissue nodularity along the lateral aspect of the knee and leg with moderate soft tissue swelling about the ankle. 2. 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 3. No acute osseous abnormality. No evidence of bone destruction/osteomyelitis. Electronically Signed   By: Ashley Royalty M.D.   On: 06/03/2018 14:52   Dg Foot Complete Left  Result Date: 06/03/2018 CLINICAL DATA:  Worsening infection. EXAM: LEFT FOOT -  COMPLETE 3+ VIEW COMPARISON:  MRI 03/26/2017.  Left foot series 10/02/2016. FINDINGS: Prior amputation left first MTP. Destructive changes noted of the distal aspect of the proximal phalanx and proximal aspect of the middle phalanx of the left second digit. Destructive changes noted of the distal portion of the distal phalanx of the left second digit. These findings are suggestive of osteomyelitis. No other focal bony abnormality identified. IMPRESSION: 1. Destructive changes of the proximal and middle phalanges of the left second digit suggesting osteomyelitis. Septic arthritis at the proximal interphalangeal joint space may be present. 2. Structures changes in the distal tip of the distal phalanx of the left second digit suggesting osteomyelitis. Electronically Signed   By: Marcello Moores  Register   On: 06/03/2018 14:50    Procedures .Critical Care Performed by: Deliah Boston, [name] Authorized by: Deliah Boston, [name]   Critical care provider statement:    Critical care time (minutes):  36   Critical care was necessary to treat or prevent imminent or life-threatening deterioration of the following conditions:  Sepsis   Critical care was time spent personally by me on the following  activities:  Discussions with consultants, evaluation of patient's response to treatment, examination of patient, ordering and performing treatments and interventions, ordering and review of laboratory studies, ordering and review of radiographic studies, pulse oximetry, re-evaluation of patient's condition, obtaining history from patient or surrogate and review of old charts   (including critical care time)  Medications Ordered in ED Medications  vancomycin (VANCOCIN) 2,000 mg in sodium chloride 0.9 % 500 mL IVPB (2,000 mg Intravenous New Bag/Given 06/03/18 1413)  ipratropium-albuterol (DUONEB) 0.5-2.5 (3) MG/3ML nebulizer solution 3 mL (has no administration in time range)  morphine 2 MG/ML injection 2 mg (2 mg Intravenous Given 06/03/18 1414)  piperacillin-tazobactam (ZOSYN) IVPB 3.375 g (0 g Intravenous Stopped 06/03/18 1414)     Initial Impression / Assessment and Plan / ED Course  I have reviewed the triage vital signs and the nursing notes.  Pertinent labs & imaging results that were available during my care of the patient were reviewed by me and considered in my medical decision making (see chart for details).    79:62 PM: 57 year old male with multiple comorbid conditions presenting today with left foot pain, swelling and drainage that began 72 hours ago.  Failed outpatient antibiotics at nursing facility.  On arrival patient tachycardic, borderline febrile and ill-appearing.  Code sepsis was called, imaging ordered in addition to chest x-ray for possible pneumonia.  Empiric antibiotics started, due to patient's CHF/rhonchi and normotensive, will finish EMS fluid bolus without additional fluids at this time.  Case discussed with Dr. Ellender Hose who agrees with work-up at this time. ------------------------------------------------ 2:55PM:  CBC with leukocytosis and anemia CMP with AKI Lactate within normal limits EKG nonacute reviewed by Dr. Ellender Hose Chest x-ray with bronchitic changes DG  tib-fib left: With soft tissue swelling and osteoarthritis  DG left foot:  IMPRESSION:  1. Destructive changes of the proximal and middle phalanges of the  left second digit suggesting osteomyelitis. Septic arthritis at the  proximal interphalangeal joint space may be present.    2. Structures changes in the distal tip of the distal phalanx of the  left second digit suggesting osteomyelitis.   Vital signs stable  Consult placed to orthopedics. -------------------------------------------------- 3:30 PM: Consultation with [name], advises patient be admitted to medical hospital for surgical evaluation tomorrow morning with his service. --------------------------------- 3:50 PM: Admission called to hospitalist, Dr. Maryland Pink who has accepted  patient to his service.  Patient is to be admitted to hospital hospital for further treatment of his sepsis and AKI. ------------------------------------------ Patient has been admitted to hospitalist service.  Appreciate orthopedic and hospitalist consultations.  Patient was seen and evaluated by Dr. Ellender Hose during this visit who agrees with work-up and admission at this time.  Note: Portions of this report may have been transcribed using voice recognition software. Every effort was made to ensure accuracy; however, inadvertent computerized transcription errors may still be present. Final Clinical Impressions(s) / ED Diagnoses   Final diagnoses:  Sepsis with acute renal failure without septic shock, due to unspecified organism, unspecified acute renal failure type ([place])  Osteomyelitis of left foot, unspecified type ([place])  Septic arthritis of left foot, due to unspecified organism [facility])    ED Discharge Orders    None       Gari Crown 06/03/18 1640    Duffy Bruce, MD 06/04/18 [id number]

## 2018-06-03 NOTE — ED Notes (Signed)
ED TO INPATIENT HANDOFF REPORT  Name/Age/Gender Gregory [name] 57 y.o. male  Code Status Code Status History    Date Active Date Inactive Code Status Order ID Comments User Context   03/25/2017 1724 04/02/2017 1953 Full Code [id number]  Geradine Girt, DO ED   11/07/2015 0631 12/02/2015 1655 Full Code [id number]  Chesley Mires, MD ED      Home/SNF/Other Home  Chief Complaint leg infection  Level of Care/Admitting Diagnosis ED Disposition    ED Disposition Condition [facility] Area: [place] [[id number]]  Level of Care: Med-Surg [16]  Diagnosis: Acute osteomyelitis of left foot [facility]) [[id number]]  Admitting Physician: Bonnielee Haff [[id number]]  Attending Physician: Bonnielee Haff [[id number]]  Estimated length of stay: past midnight tomorrow  Certification:: I certify this patient will need inpatient services for at least 2 midnights  PT Class (Do Not Modify): Inpatient [101]  PT Acc Code (Do Not Modify): Private [1]       Medical History Past Medical History:  Diagnosis Date  . Acquired absence of left great toe ([place])   . Acquired absence of right leg below knee ([place])   . Acute osteomyelitis of calcaneum, right ([place]) 10/02/2016  . Acute respiratory failure ([place])   . Anemia   . Basal cell carcinoma, eyelid   . Cancer ([place])   . CHF (congestive heart failure) (H[facility])    chronic diastolic   . Chronic back pain   . Chronic kidney disease    stage 3   . Chronic multifocal osteomyelitis (H[facility])    of ankle and foot   . Chronic pain syndrome   . COPD (chronic obstructive pulmonary disease) ([place])   . DDD (degenerative disc disease), lumbar   . Depression    major depressive disorder   . Diabetes mellitus without complication ([place])    type 2   . Diabetic ulcer of toe of left foot ([place]) 10/02/2016  . Difficulty in walking, not elsewhere classified   . 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 Foot drop, bilateral   . Foot drop, right 12/27/2015  . GERD (gastroesophageal  reflux disease)   . Herpesviral vesicular dermatitis   . Hyperlipidemia   . Hyperlipidemia   . Hypertension   . Insomnia   . Malignant melanoma of other parts of face ([place])   . MRSA bacteremia   . Muscle weakness (generalized)   . Nasal congestion   . Neuromuscular dysfunction of bladder   . Osteomyelitis ([place])   . Osteomyelitis ([place])   . Other idiopathic peripheral autonomic neuropathy   . Retention of urine, unspecified   . Squamous cell carcinoma of skin   . Unsteadiness on feet   . Urinary retention   . Urinary retention   . Urinary tract infection     Allergies Allergies  Allergen Reactions  . Latex Other (See Comments)    Unknown - per [facility]    IV Location/Drains/Wounds Patient Lines/Drains/Airways Status   Active Line/Drains/Airways    Name:   Placement date:   Placement time:   Site:   Days:   Peripheral IV 06/03/18 Left Wrist   06/03/18    1325    Wrist   less than 1   Urethral Catheter Dr. Karsten Ro Latex 16 Fr.   04/25/18    1037    Latex   39   Airway   04/25/18    1018     39   Incision (Closed) 11/25/15 Neck Right   11/25/15  2043     921   Incision (Closed) 11/29/15 Neck   11/29/15    1751     917   Incision (Closed) 03/29/17 Leg Right   03/29/17    1058     431   Incision (Closed) 03/29/17 Foot Left   03/29/17    1058     431   Pressure Ulcer 11/10/15 Stage I -  Intact skin with non-blanchable redness of a localized area usually over a bony prominence.   11/10/15    0800     936   Wound / Incision (Open or Dehisced) 11/12/15 Toe (Comment  which one) Right    11/12/15    0800    Toe (Comment  which one)   934   Wound / Incision (Open or Dehisced) 03/25/17 Diabetic ulcer Foot Right 3.5 x 3.5   03/25/17    2100    Foot   435   Wound / Incision (Open or Dehisced) 03/25/17 Diabetic ulcer Heel Right   03/25/17    2100    Heel   435   Wound / Incision (Open or Dehisced) 03/25/17 Diabetic ulcer Ankle Right   03/25/17    2100    Ankle   435   Wound / Incision (Open  or Dehisced) 03/25/17 Diabetic ulcer Toe (Comment  which one) Right   03/25/17    2100    Toe (Comment  which one)   435   Wound / Incision (Open or Dehisced) 03/25/17 Diabetic ulcer Toe (Comment  which one) Left   03/25/17    2100    Toe (Comment  which one)   435          Labs/Imaging Results for orders placed or performed during the hospital encounter of 06/03/18 (from the past 48 hour(s))  CBC with Differential     Status: Abnormal   Collection Time: 06/03/18  1:33 PM  Result Value Ref Range   WBC 14.9 (H) 4.0 - 10.5 K/uL   RBC 3.15 (L) 4.22 - 5.81 MIL/uL   Hemoglobin 8.7 (L) 13.0 - 17.0 g/dL   HCT 28.6 (L) 39.0 - 52.0 %   MCV 90.8 80.0 - 100.0 fL   MCH 27.6 26.0 - 34.0 pg   MCHC 30.4 30.0 - 36.0 g/dL   RDW 16.2 (H) 11.5 - 15.5 %   Platelets 178 150 - 400 K/uL   nRBC 0.0 0.0 - 0.2 %   Neutrophils Relative % 87 %   Neutro Abs 13.0 (H) 1.7 - 7.7 K/uL   Lymphocytes Relative 5 %   Lymphs Abs 0.7 0.7 - 4.0 K/uL   Monocytes Relative 5 %   Monocytes Absolute 0.7 0.1 - 1.0 K/uL   Eosinophils Relative 0 %   Eosinophils Absolute 0.0 0.0 - 0.5 K/uL   Basophils Relative 0 %   Basophils Absolute 0.0 0.0 - 0.1 K/uL   Immature Granulocytes 3 %   Abs Immature Granulocytes 0.49 (H) 0.00 - 0.07 K/uL    Comment: Performed at [facility], [place] [address], [place], [place] [address]  Comprehensive metabolic panel     Status: Abnormal   Collection Time: 06/03/18  1:33 PM  Result Value Ref Range   Sodium 133 (L) 135 - 145 mmol/L   Potassium 4.8 3.5 - 5.1 mmol/L   Chloride 100 98 - 111 mmol/L   CO2 20 (L) 22 - 32 mmol/L   Glucose, Bld 170 (H) 70 - 99 mg/dL  BUN 66 (H) 6 - 20 mg/dL   Creatinine, Ser 3.24 (H) 0.61 - 1.24 mg/dL   Calcium 8.4 (L) 8.9 - 10.3 mg/dL   Total Protein 6.6 6.5 - 8.1 g/dL   Albumin 2.8 (L) 3.5 - 5.0 g/dL   AST 15 15 - 41 U/L   ALT 13 0 - 44 U/L   Alkaline Phosphatase 114 38 - 126 U/L   Total Bilirubin 0.9 0.3 - 1.2 mg/dL   GFR calc non Af Amer  20 (L) >60 mL/min   GFR calc Af Amer 23 (L) >60 mL/min   Anion gap 13 5 - 15    Comment: Performed at [facility], [place] [address], [place], [place] [address]  I-Stat Chem 8, ED     Status: Abnormal   Collection Time: 06/03/18  1:41 PM  Result Value Ref Range   Sodium 136 135 - 145 mmol/L   Potassium 4.8 3.5 - 5.1 mmol/L   Chloride 104 98 - 111 mmol/L   BUN 60 (H) 6 - 20 mg/dL   Creatinine, Ser 3.30 (H) 0.61 - 1.24 mg/dL   Glucose, Bld 174 (H) 70 - 99 mg/dL   Calcium, Ion 1.10 (L) 1.15 - 1.40 mmol/L   TCO2 22 22 - 32 mmol/L   Hemoglobin 9.2 (L) 13.0 - 17.0 g/dL   HCT 27.0 (L) 39.0 - 52.0 %  I-Stat CG4 Lactic Acid, ED     Status: None   Collection Time: 06/03/18  1:41 PM  Result Value Ref Range   Lactic Acid, Venous 1.02 0.5 - 1.9 mmol/L  POC CBG, ED     Status: Abnormal   Collection Time: 06/03/18  1:56 PM  Result Value Ref Range   Glucose-Capillary 154 (H) 70 - 99 mg/dL   Comment 1 Notify RN    Comment 2 Document in Chart   Urinalysis, Routine w reflex microscopic     Status: Abnormal   Collection Time: 06/03/18  4:24 PM  Result Value Ref Range   Color, Urine YELLOW YELLOW   APPearance CLEAR CLEAR   Specific Gravity, Urine 1.015 1.005 - 1.030   pH 5.5 5.0 - 8.0   Glucose, UA NEGATIVE NEGATIVE mg/dL   Hgb urine dipstick NEGATIVE NEGATIVE   Bilirubin Urine NEGATIVE NEGATIVE   Ketones, ur NEGATIVE NEGATIVE mg/dL   Protein, ur TRACE (A) NEGATIVE mg/dL   Nitrite NEGATIVE NEGATIVE   Leukocytes, UA TRACE (A) NEGATIVE   RBC / HPF 0-5 0 - 5 RBC/hpf   WBC, UA 6-10 0 - 5 WBC/hpf   Bacteria, UA RARE (A) NONE SEEN   Squamous Epithelial / LPF 0-5 0 - 5   Mucus PRESENT     Comment: Performed at [facility], [place] [address] [place], [place] [address]   Dg Chest 2 View  Result Date: 06/03/2018 CLINICAL DATA:  Rhon[place] EXAM: CHEST - 2 VIEW COMPARISON:  11/26/2015 FINDINGS: Generalized interstitial coarsening without Kerley lines or effusion.  Heart size is normal. No collapse or consolidation. IMPRESSION: Bronchitic markings without focal pneumonia. Electronically Signed   By: Monte Fantasia M.D.   On: 06/03/2018 14:47   Dg Tibia/fibula Left  Result Date: 06/03/2018 CLINICAL DATA:  Worsening left leg infection x3 days. EXAM: LEFT TIBIA AND FIBULA - 2 VIEW COMPARISON:  None. FINDINGS: Overlapping AP and lateral views of the left tibia and fibula are provided. Soft tissue nodularity is seen along the lateral aspect of the knee and leg with moderate soft tissue swelling  noted about the ankle. No acute fracture, bone destruction or suspicious osseous lesions. Osteoarthritic joint space narrowing of the medial femorotibial compartment with spurring of the tibial spine is identified. No joint effusion is seen. IMPRESSION: 1. Soft tissue nodularity along the lateral aspect of the knee and leg with moderate soft tissue swelling about the ankle. 2. 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 3. No acute osseous abnormality. No evidence of bone destruction/osteomyelitis. Electronically Signed   By: Ashley Royalty M.D.   On: 06/03/2018 14:52   Dg Foot Complete Left  Result Date: 06/03/2018 CLINICAL DATA:  Worsening infection. EXAM: LEFT FOOT - COMPLETE 3+ VIEW COMPARISON:  MRI 03/26/2017.  Left foot series 10/02/2016. FINDINGS: Prior amputation left first MTP. Destructive changes noted of the distal aspect of the proximal phalanx and proximal aspect of the middle phalanx of the left second digit. Destructive changes noted of the distal portion of the distal phalanx of the left second digit. These findings are suggestive of osteomyelitis. No other focal bony abnormality identified. IMPRESSION: 1. Destructive changes of the proximal and middle phalanges of the left second digit suggesting osteomyelitis. Septic arthritis at the proximal interphalangeal joint space may be present. 2. Structures changes in the distal tip of the distal  phalanx of the left second digit suggesting osteomyelitis. Electronically Signed   By: Marcello Moores  Register   On: 06/03/2018 14:50    Pending Labs Unresulted Labs (From admission, onward)    Start     Ordered   06/04/18 0500  Creatinine, serum  Daily,   R     06/03/18 1704   06/03/18 1323  Blood culture (routine x 2)  BLOOD CULTURE X 2,   STAT     06/03/18 1322   Signed and Held  HIV antibody (Routine Testing)  Tomorrow morning,   R     Signed and Held   Signed and Held  Comprehensive metabolic panel  Tomorrow morning,   R     Signed and Held   Signed and Held  CBC  Tomorrow morning,   R     Signed and Held   Signed and Held  Protime-INR  Tomorrow morning,   R     Signed and Held   Signed and Held  Hemoglobin A1c  Tomorrow morning,   R    Comments:  To assess prior glycemic control    Signed and Held          Vitals/Pain Today's Vitals   06/03/18 1700 06/03/18 1730 06/03/18 1808 06/03/18 1856  BP: (!) 142/83 135/74 135/74 112/80  Pulse:   96 87  Resp: 20 15 15 16   Temp:      TempSrc:      SpO2:   95% 96%  PainSc:        Isolation Precautions No active isolations  Medications Medications  ipratropium-albuterol (DUONEB) 0.5-2.5 (3) MG/3ML nebulizer solution 3 mL (3 mLs Nebulization Not Given 06/03/18 1550)  piperacillin-tazobactam (ZOSYN) IVPB 3.375 g (has no administration in time range)  vancomycin (VANCOCIN) 1,250 mg in sodium chloride 0.9 % 250 mL IVPB (has no administration in time range)  morphine 2 MG/ML injection 2 mg (2 mg Intravenous Given 06/03/18 1414)  piperacillin-tazobactam (ZOSYN) IVPB 3.375 g (0 g Intravenous Stopped 06/03/18 1414)  vancomycin (VANCOCIN) 2,000 mg in sodium chloride 0.9 % 500 mL IVPB (0 mg Intravenous Stopped 06/03/18 1626)    Mobility non-ambulatory

## 2018-06-03 NOTE — ED Triage Notes (Signed)
EMS reports from [facility] home,Left leg worsening infection X 3 days. Been taking ABX yet worsening symptoms. Strong dark urine over last few days as well. Hx of Diabetes and CHF  BP 124/70 HR 92 RR 20 Sp02 95 on 2 ltrs CBG 188  20ga L wrist

## 2018-06-03 NOTE — ED Notes (Signed)
[facility] at bedside 

## 2018-06-03 NOTE — ED Notes (Signed)
Pt requested drink, Judson Roch, RN notified.

## 2018-06-03 NOTE — ED Notes (Signed)
No urine output in condom cath at this time

## 2018-06-03 NOTE — ED Notes (Signed)
Bed: WA18 Expected date:  Expected time:  Means of arrival:  Comments: ResA 

## 2018-06-03 NOTE — Progress Notes (Signed)
Pharmacy Antibiotic Note  Gregory [name] is a 57 y.o. male admitted on 06/03/2018 with medical history significant for osteomyelitis, epidural abscess, diabetes, CKD stage III, CHF, MRSA presenting today from nursing facility for 72-hour history of left foot pain and drainage.  Pharmacy has been consulted for vancomycin and zosyn dosing for osteomyelitis/wound infection.  Plan: Vancomycin 2gm IV x 1 then 1250mg  q36h (AUC 487.3, Scr 3.3) Zosyn 3.375gm over 30 min x 1 then continue q8h extended interval Daily Scr Follow renal function, cultures and clinical course     Temp (24hrs), Avg:100.2 F (37.9 C), Min:100.2 F (37.9 C), Max:100.2 F (37.9 C)  Recent Labs  Lab 06/03/18 1333 06/03/18 1341  WBC 14.9*  --   CREATININE 3.24* 3.30*  LATICACIDVEN  --  1.02    CrCl cannot be calculated (Unknown ideal weight.).    Allergies  Allergen Reactions  . Latex Other (See Comments)    Unknown - per [facility]    Antimicrobials this admission: 12/17 vancomycin >> 12/17 zosyn >> Dose adjustments this admission:   Microbiology results: Thank you for allowing pharmacy to be a part of this patient's care.  Dolly Rias RPh 06/03/2018, 5:05 PM Pager [phone]

## 2018-06-03 NOTE — ED Notes (Signed)
[facility] contacted. Paperwork printed.  

## 2018-06-03 NOTE — H&P (Addendum)
[facility] History and Physical  Gregory [name] [name]:[id number] DOB: [birth year] DOA: 06/03/2018   PCP: [name], MD  Specialists: [name] is his orthopedic surgeon  Chief Complaint: Pain in the left foot and left leg  HPI: Gregory [name] is a 57 y.o. male with a past medical history of diabetes mellitus type 2, essential hypertension, chronic diastolic CHF, urethral stricture status post recent cystoscopy and urethral dilatation, chronic kidney disease stage III, right lower extremity amputation who was in his usual state of health until a few days ago when he started developing pain in his left foot as well as left leg.  Patient was started on antibiotics at his nursing facility including Keflex and azithromycin.  However his symptoms got worse and so he was sent over to the emergency department for further evaluation.  He had a fever with a temperature of 100.2 F.  He describes a burning type sensation in his left lower extremity.  Pain is 8 out of 10 in intensity.  He says he has not been urinating much the last few days.  Denies any difficulty breathing at this time.  No chest pain.  No abdominal pain.  In the emergency department patient was found to have blisters over his left lower extremity.  Imaging studies suggested osteomyelitis in his left foot.  [name] was contacted who recommended transferring the patient over to [facility] for surgical intervention tomorrow.  Patient was noted to have tachycardia tachypnea leukocytosis.  There was evidence for sepsis.  Home Medications: Prior to Admission medications   Medication Sig Start Date End Date Taking? Authorizing Provider  acetaminophen (TYLENOL) 325 MG tablet Take 650 mg by mouth every 4 (four) hours as needed for moderate pain.   Yes [provider]  albuterol (PROVENTIL HFA;VENTOLIN HFA) 108 (90 Base) MCG/ACT inhaler Inhale 2 puffs into the lungs every 6 (six) hours as needed for wheezing or shortness of breath.    Yes [provider]  antiseptic oral rinse (BIOTENE) LIQD 15 mLs by Mouth Rinse route every 4 (four) hours as needed for dry mouth.    Yes [provider]  atorvastatin (LIPITOR) 20 MG tablet Take 20 mg by mouth at bedtime.   Yes [provider]  azithromycin (ZITHROMAX) 250 MG tablet Take 250 mg by mouth daily. 06/03/18 06/07/18 Yes [provider]  Bacitracin-Polymyxin B (POLY BACITRACIN) 500-10000 UNIT/GM OINT Place 1 application into the right eye every 6 (six) hours. 05/29/18 06/04/18 Yes [provider]  bisacodyl (DULCOLAX) 10 MG suppository Place 1 suppository (10 mg total) rectally daily as needed for moderate constipation. 12/02/15  Yes [name], MD  buPROPion (WELLBUTRIN XL) 300 MG 24 hr tablet Take 300 mg by mouth at bedtime.   Yes [provider]  calcium carbonate (TUMS - DOSED IN MG ELEMENTAL CALCIUM) 500 MG chewable tablet Chew 2 tablets by mouth every 8 (eight) hours as needed for indigestion or heartburn.   Yes [provider]  cephALEXin (KEFLEX) 500 MG capsule Take 500 mg by mouth every 12 (twelve) hours. 06/02/18 06/09/18 Yes [provider]  cetirizine (ZYRTEC) 10 MG tablet Take 10 mg by mouth daily.   Yes [provider]  Cholecalciferol (VITAMIN D-3) 5000 units TABS Take 5,000 Units by mouth daily.   Yes [provider]  diphenhydrAMINE (BENADRYL) 25 MG tablet Take 1 tablet (25 mg total) by mouth every 6 (six) hours. 02/23/17  Yes Jola Schmidt, MD  eszopiclone (LUNESTA) 2 MG TABS tablet  Take 2 mg by mouth at bedtime. Take immediately before bedtime   Yes [provider]  finasteride (PROSCAR) 5 MG tablet Take 5 mg by mouth daily.   Yes [provider]  furosemide (LASIX) 20 MG tablet Take 20 mg by mouth daily.    Yes [provider]  gemfibrozil (LOPID) 600 MG tablet Take 600 mg by mouth every 12 (twelve) hours.   Yes [provider]  glipiZIDE  (GLUCOTROL) 10 MG tablet Take 10 mg by mouth daily before breakfast.   Yes [provider]  glucagon (GLUCAGON EMERGENCY) 1 MG injection Inject 1 mg into the muscle once as needed (for CBG <70).    Yes [provider]  guaiFENesin (MUCINEX) 600 MG 12 hr tablet Take 600 mg by mouth every 12 (twelve) hours as needed for cough (congestion).  04/23/18  Yes [provider]  guaiFENesin (MUCINEX) 600 MG 12 hr tablet Take 600 mg by mouth 2 (two) times daily. 05/27/18 06/06/18 Yes [provider]  insulin lispro (HUMALOG KWIKPEN) 100 UNIT/ML KwikPen Inject 0-10 Units into the skin See admin instructions. Inject per sliding scale 3 times daily before meals; CBG <60 = notify MD, 0-150 = 0 units, 151-200 = 2 units, 201-250 = 4 units, 251-300 = 6 units, 301-350 = 8 units, 351-400 = 10 units, >400 = notify MD   Yes [provider]  ipratropium-albuterol (DUONEB) 0.5-2.5 (3) MG/3ML SOLN Take 3 mLs by nebulization every 6 (six) hours.  06/02/18 06/07/18 Yes [provider]  ipratropium-albuterol (DUONEB) 0.5-2.5 (3) MG/3ML SOLN Take 3 mLs by nebulization every 6 (six) hours as needed (congestion/SOB).   Yes [provider]  lidocaine (LIDODERM) 5 % Place 1 patch onto the skin daily. Remove & Discard patch within 12 hours or as directed by MD   Yes [provider]  loratadine (CLARITIN) 10 MG tablet Take 10 mg by mouth daily. 06/02/18 06/09/18 Yes [provider]  magnesium oxide (MAG-OX) 400 MG tablet Take 400 mg by mouth 2 (two) times daily.   Yes [provider]  metoprolol tartrate (LOPRESSOR) 25 MG tablet Take 25 mg by mouth daily.   Yes [provider]  mometasone (NASONEX) 50 MCG/ACT nasal spray Place 1 spray into the nose daily.   Yes [provider]  morphine (MS CONTIN) 15 MG 12 hr tablet Take 15 mg by mouth every 12 (twelve) hours.   Yes [provider]  omeprazole (PRILOSEC) 20 MG capsule Take  40 mg by mouth daily.    Yes [provider]  ondansetron (ZOFRAN) 4 MG tablet Take 4 mg by mouth every 6 (six) hours as needed for nausea or vomiting.   Yes [provider]  oxybutynin (DITROPAN XL) 15 MG 24 hr tablet Take 15 mg by mouth at bedtime.   Yes [provider]  oxyCODONE (ROXICODONE) 15 MG immediate release tablet Take 15 mg by mouth every 6 (six) hours as needed for pain.   Yes [provider]  PARoxetine (PAXIL) 20 MG tablet Take 20 mg by mouth at bedtime.   Yes [provider]  predniSONE (DELTASONE) 20 MG tablet Take 40 mg by mouth daily with breakfast. 06/03/18 06/08/18 Yes [provider]  pregabalin (LYRICA) 150 MG capsule Take 150 mg by mouth 2 (two) times daily.   Yes [provider]  senna-docusate (SENOKOT-S) 8.6-50 MG tablet Take 2 tablets by mouth at bedtime.   Yes [provider]  tamsulosin (FLOMAX) 0.4 MG  CAPS capsule Take 0.8 mg by mouth every evening.  11/26/13  Yes [provider]  tiZANidine (ZANAFLEX) 2 MG tablet Take 2 mg by mouth every 8 (eight) hours as needed for muscle spasms.   Yes [provider]  vitamin B-12 (CYANOCOBALAMIN) 1000 MCG tablet Take 1,000 mcg by mouth daily.   Yes [provider]    Allergies:  Allergies  Allergen Reactions  . Latex Other (See Comments)    Unknown - per [facility]    Past Medical History: Past Medical History:  Diagnosis Date  . Acquired absence of left great toe ([place])   . Acquired absence of right leg below knee ([place])   . Acute osteomyelitis of calcaneum, right ([place]) 10/02/2016  . Acute respiratory failure ([place])   . Anemia   . Basal cell carcinoma, eyelid   . Cancer ([place])   . CHF (congestive heart failure) (HCC)    chronic diastolic   . Chronic back pain   . Chronic kidney disease    stage 3   . Chronic multifocal osteomyelitis (HCC)    of ankle and foot   . Chronic pain syndrome   . COPD (chronic obstructive pulmonary  disease) ([place])   . DDD (degenerative disc disease), lumbar   . Depression    major depressive disorder   . Diabetes mellitus without complication ([place])    type 2   . Diabetic ulcer of toe of left foot ([place]) 10/02/2016  . Difficulty in walking, not elsewhere classified   . 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 Foot drop, bilateral   . Foot drop, right 12/27/2015  . GERD (gastroesophageal reflux disease)   . Herpesviral vesicular dermatitis   . Hyperlipidemia   . Hyperlipidemia   . Hypertension   . Insomnia   . Malignant melanoma of other parts of face ([place])   . MRSA bacteremia   . Muscle weakness (generalized)   . Nasal congestion   . Neuromuscular dysfunction of bladder   . Osteomyelitis ([place])   . Osteomyelitis ([place])   . Other idiopathic peripheral autonomic neuropathy   . Retention of urine, unspecified   . Squamous cell carcinoma of skin   . Unsteadiness on feet   . Urinary retention   . Urinary retention   . Urinary tract infection     Past Surgical History:  Procedure Laterality Date  . AMPUTATION Left 03/29/2017   Procedure: LEFT GREAT TOE AMPUTATION AT METATARSOPHALANGEAL JOINT;  Surgeon: [name], MD;  Location: [place];  Service: Orthopedics;  Laterality: Left;  . AMPUTATION Right 03/29/2017   Procedure: RIGHT BELOW KNEE AMPUTATION;  Surgeon: [name], MD;  Location: [place];  Service: Orthopedics;  Laterality: Right;  . ANTERIOR CERVICAL CORPECTOMY N/A 11/25/2015   Procedure: ANTERIOR CERVICAL FIVE CORPECTOMY Cervical four - six fusion;  Surgeon: Consuella Lose, MD;  Location: [place] [facility];  Service: Neurosurgery;  Laterality: N/A;  ANTERIOR CERVICAL FIVE CORPECTOMY Cervical four - six fusion  . CYSTOSCOPY WITH RETROGRADE URETHROGRAM N/A 04/25/2018   Procedure: CYSTOSCOPY WITH RETROGRADE URETHROGRAM/ BALLOON DILATION;  Surgeon: Kathie Rhodes, MD;  Location: [facility];  Service: Urology;  Laterality: N/A;  . POSTERIOR CERVICAL FUSION/FORAMINOTOMY N/A 11/29/2015    Procedure: Cervical Three-Cervical Seven Posterior Cervical Laminectomy with Fusion;  Surgeon: Consuella Lose, MD;  Location: [place] [facility];  Service: Neurosurgery;  Laterality: N/A;  Cervical Three-Cervical Seven Posterior Cervical Laminectomy with Fusion    Social History: Former smoker.  No alcohol use.  No illicit drug use.  Family History:  Family History  Problem Relation Age of Onset  . Cancer Other   . Diabetes Other      Review of Systems - History obtained from the patient General ROS: positive for  - chills and fatigue Psychological ROS: negative Ophthalmic ROS: negative ENT ROS: negative Allergy and Immunology ROS: negative Hematological and Lymphatic ROS: negative Endocrine ROS: negative Respiratory ROS: no cough, shortness of breath, or wheezing Cardiovascular ROS: no chest pain or dyspnea on exertion Gastrointestinal ROS: no abdominal pain, change in bowel habits, or black or bloody stools Genito-Urinary ROS: as in hpi Musculoskeletal ROS: as in hpi Neurological ROS: no TIA or stroke symptoms Dermatological ROS: negative  Physical Examination  Vitals:   06/03/18 1324 06/03/18 1400 06/03/18 1453  BP: 130/76 126/72 (!) 130/107  Pulse: (!) 122 90 95  Resp: 20 19 20   Temp: 100.2 F (37.9 C)    TempSrc: Oral    SpO2: 94% 92% 92%    BP (!) 130/107   Pulse 95   Temp 100.2 F (37.9 C) (Oral)   Resp 20   SpO2 92%   General appearance: alert, cooperative, appears stated age and no distress Head: Normocephalic, without obvious abnormality, atraumatic Eyes: conjunctivae/corneas clear. PERRL, EOM's intact.  Throat: lips, mucosa, and tongue normal; teeth and gums normal Neck: no adenopathy, no carotid bruit, no JVD, supple, symmetrical, trachea midline and thyroid not enlarged, symmetric, no tenderness/mass/nodules Resp: Normal effort at rest.  Coarse breath sounds bilaterally.  No wheezing or rhonchi. Cardio: regular rate and rhythm, S1, S2 normal, no  murmur, click, rub or gallop GI: soft, non-tender; bowel sounds normal; no masses,  no organomegaly Extremities: Status post right lower extremity amputation.  Left lower leg noted to have multiple blisters weeping clear liquid.  Warm to touch.  Edematous.  Left foot is also noted to be swollen.  Erythematous. Pulses: unable to palpate in left foot due to swelling Skin: Chronic skin changes noted in the left lower extremity.  Erythema noted over the left foot.  Warm to touch. Lymph nodes: Cervical, supraclavicular, and axillary nodes normal. Neurologic: Alert and oriented x3.  Cranial nerves II to XII intact.  Motor strength equal bilateral upper and lower extremities.   Labs on Admission: I have personally reviewed following labs and imaging studies  CBC: Recent Labs  Lab 06/03/18 1333 06/03/18 1341  WBC 14.9*  --   NEUTROABS 13.0*  --   HGB 8.7* 9.2*  HCT 28.6* 27.0*  MCV 90.8  --   PLT 178  --    Basic Metabolic Panel: Recent Labs  Lab 06/03/18 1333 06/03/18 1341  NA 133* 136  K 4.8 4.8  CL 100 104  CO2 20*  --   GLUCOSE 170* 174*  BUN 66* 60*  CREATININE 3.24* 3.30*  CALCIUM 8.4*  --    GFR: CrCl cannot be calculated (Unknown ideal weight.). Liver Function Tests: Recent Labs  Lab 06/03/18 1333  AST 15  ALT 13  ALKPHOS 114  BILITOT 0.9  PROT 6.6  ALBUMIN 2.8*   CBG: Recent Labs  Lab 06/03/18 1356  GLUCAP 154*     Radiological Exams on Admission: Dg Chest 2 View  Result Date: 06/03/2018 CLINICAL DATA:  [place] EXAM: CHEST - 2 VIEW COMPARISON:  11/26/2015 FINDINGS: Generalized interstitial coarsening without Kerley lines or effusion. Heart size is normal. No collapse or consolidation. IMPRESSION: Bronchitic markings without focal pneumonia. Electronically Signed   By: Monte Fantasia M.D.   On: 06/03/2018  14:47   Dg Tibia/fibula Left  Result Date: 06/03/2018 CLINICAL DATA:  Worsening left leg infection x3 days. EXAM: LEFT TIBIA AND FIBULA - 2 VIEW  COMPARISON:  None. FINDINGS: Overlapping AP and lateral views of the left tibia and fibula are provided. Soft tissue nodularity is seen along the lateral aspect of the knee and leg with moderate soft tissue swelling noted about the ankle. No acute fracture, bone destruction or suspicious osseous lesions. Osteoarthritic joint space narrowing of the medial femorotibial compartment with spurring of the tibial spine is identified. No joint effusion is seen. IMPRESSION: 1. Soft tissue nodularity along the lateral aspect of the knee and leg with moderate soft tissue swelling about the ankle. 2. 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 3. No acute osseous abnormality. No evidence of bone destruction/osteomyelitis. Electronically Signed   By: Ashley Royalty M.D.   On: 06/03/2018 14:52   Dg Foot Complete Left  Result Date: 06/03/2018 CLINICAL DATA:  Worsening infection. EXAM: LEFT FOOT - COMPLETE 3+ VIEW COMPARISON:  MRI 03/26/2017.  Left foot series 10/02/2016. FINDINGS: Prior amputation left first MTP. Destructive changes noted of the distal aspect of the proximal phalanx and proximal aspect of the middle phalanx of the left second digit. Destructive changes noted of the distal portion of the distal phalanx of the left second digit. These findings are suggestive of osteomyelitis. No other focal bony abnormality identified. IMPRESSION: 1. Destructive changes of the proximal and middle phalanges of the left second digit suggesting osteomyelitis. Septic arthritis at the proximal interphalangeal joint space may be present. 2. Structures changes in the distal tip of the distal phalanx of the left second digit suggesting osteomyelitis. Electronically Signed   By: Marcello Moores  Register   On: 06/03/2018 14:50    My interpretation of Electrocardiogram: Sinus rhythm in the 90s.  Normal axis.  Intervals are normal.  No Q waves.  No concerning ST or T wave changes noted.   Problem List  Principal  Problem:   Acute osteomyelitis of left foot (HCC) Active Problems:   COPD (chronic obstructive pulmonary disease) (HCC)   Chronic diastolic CHF (congestive heart failure) (HCC)   Type II diabetes mellitus with neurological manifestations (HCC)   Chronic pain   Depression, recurrent (HCC)   Septic arthritis ([place])   Assessment: This is a 57 year old Caucasian male with past medical history as stated earlier who comes in with worsening pain in his left lower extremity.  He has blisters over the left lower leg.  Imaging studies raise concern for osteomyelitis involving the proximal and middle phalanges of the left second digit along with septic arthritis at the PIP.  There is also concern for osteomyelitis involving the distal phalanx of the second digit.  Plan:  1. Acute osteomyelitis and septic arthritis involving the left foot sepsis: Patient has sepsis at presentation tachycardia, tachypnea, leukocytosis and obvious source of infection.  Patient will be placed on vancomycin and Zosyn for now.  Case has been discussed with [name] who recommends transfer to [facility] for possible surgical intervention tomorrow.  Pain control.  Bowel regimen.  Defer advanced imaging studies such as MRI to [name].  Follow-up on cultures. Lactic acid level normal.  2.  Acute on chronic kidney disease stage III: Creatinine higher than his usual baseline which is around 1.5-2.0.  Hold nephrotoxic agents.  Hold his diuretics.  Gently hydrate him.  He does appear to be hypovolemic.  3.  Diabetes mellitus type 2 with neurological manifestations:  Patient insulin at home.  His oral agents will be held.  Check HbA1c.  SSI.  4.  Normocytic anemia: Hemoglobin close to baseline.  This is all likely due to anemia of chronic disease.  No evidence of overt bleeding.  5. Chronic diastolic CHF: Hold his diuretics.  This is because the patient appears to be hypovolemic.  Gently hydrate him.  Monitor volume status  closely.  6.  Steroid use: Patient noted to be on prednisone.  He said he was started on prednisone about a month ago for reasons that are not entirely clear.  Unable to determine from previous notes either.  He was on 40 mg and then he was cut down to 20 mg.  We will put him on 10 mg for now.  This will need to be investigated further.  Do not want to discontinue abruptly.    7. Chronic pain: Continue with his MS Contin and oxycodone for breakthrough pain.  8.  History of urethral stricture: Recently underwent dilatation of his urethral stricture by Dr. Karsten Ro.  He states that he has been able to urinate better than before.  Check bladder scan.  Continue with his Flomax.  DVT Prophylaxis: Definitive medical prophylaxis after his surgical intervention Code Status: Full code Family Communication: No family at bedside Consults called: [name]  Severity of Illness: The appropriate patient status for this patient is INPATIENT. Inpatient status is judged to be reasonable and necessary in order to provide the required intensity of service to ensure the patient's safety. The patient's presenting symptoms, physical exam findings, and initial radiographic and laboratory data in the context of their chronic comorbidities is felt to place them at high risk for further clinical deterioration. Furthermore, it is not anticipated that the patient will be medically stable for discharge from the hospital within 2 midnights of admission. The following factors support the patient status of inpatient.   " The patient's presenting symptoms include left leg and foot pain. " The worrisome physical exam findings include blisters over the left leg. " The initial radiographic and laboratory data are worrisome because of findings suggestive of osteomyelitis. " The chronic co-morbidities include diabetes mellitus type 2, chronic diastolic CHF, chronic kidney disease stage III.   * I certify that at the point of  admission it is my clinical judgment that the patient will require inpatient hospital care spanning beyond 2 midnights from the point of admission due to high intensity of service, high risk for further deterioration and high frequency of surveillance required.*    Further management decisions will depend on results of further testing and patient's response to treatment.   Bonnielee Haff  [facility] Pager [phone]  If 7PM-7AM, please contact night-coverage [email or web address] Password [facility]  06/03/2018, 4:26 PM

## 2018-06-03 NOTE — Progress Notes (Signed)
A consult was received from an ED physician for vancomycin and zosyn per pharmacy dosing.  The patient's profile has been reviewed for ht/wt/allergies/indication/available labs.   A one time order has been placed for vancomycin 2gm and zosyn 3.375gm.    Further antibiotics/pharmacy consults should be ordered by admitting physician if indicated.                       Thank you, Dolly Rias RPh 06/03/2018, 1:38 PM Pager [phone]

## 2018-06-03 NOTE — ED Notes (Signed)
Condom cath in place to obtain urine specimen

## 2018-06-04 ENCOUNTER — Inpatient Hospital Stay (HOSPITAL_COMMUNITY): Payer: Medicaid Other

## 2018-06-04 DIAGNOSIS — I130 Hypertensive heart and chronic kidney disease with heart failure and stage 1 through stage 4 chronic kidney disease, or unspecified chronic kidney disease: Secondary | ICD-10-CM

## 2018-06-04 DIAGNOSIS — E1122 Type 2 diabetes mellitus with diabetic chronic kidney disease: Secondary | ICD-10-CM

## 2018-06-04 DIAGNOSIS — L03116 Cellulitis of left lower limb: Secondary | ICD-10-CM

## 2018-06-04 DIAGNOSIS — I5032 Chronic diastolic (congestive) heart failure: Secondary | ICD-10-CM

## 2018-06-04 DIAGNOSIS — Z91040 Latex allergy status: Secondary | ICD-10-CM

## 2018-06-04 DIAGNOSIS — E43 Unspecified severe protein-calorie malnutrition: Secondary | ICD-10-CM

## 2018-06-04 DIAGNOSIS — Z87448 Personal history of other diseases of urinary system: Secondary | ICD-10-CM

## 2018-06-04 DIAGNOSIS — E1169 Type 2 diabetes mellitus with other specified complication: Secondary | ICD-10-CM

## 2018-06-04 DIAGNOSIS — E861 Hypovolemia: Secondary | ICD-10-CM

## 2018-06-04 DIAGNOSIS — Z87891 Personal history of nicotine dependence: Secondary | ICD-10-CM

## 2018-06-04 DIAGNOSIS — Z89511 Acquired absence of right leg below knee: Secondary | ICD-10-CM

## 2018-06-04 DIAGNOSIS — Z89412 Acquired absence of left great toe: Secondary | ICD-10-CM

## 2018-06-04 DIAGNOSIS — N183 Chronic kidney disease, stage 3 (moderate): Secondary | ICD-10-CM

## 2018-06-04 DIAGNOSIS — L03032 Cellulitis of left toe: Secondary | ICD-10-CM

## 2018-06-04 DIAGNOSIS — M009 Pyogenic arthritis, unspecified: Secondary | ICD-10-CM

## 2018-06-04 DIAGNOSIS — M86172 Other acute osteomyelitis, left ankle and foot: Secondary | ICD-10-CM

## 2018-06-04 DIAGNOSIS — N179 Acute kidney failure, unspecified: Secondary | ICD-10-CM

## 2018-06-04 DIAGNOSIS — E440 Moderate protein-calorie malnutrition: Secondary | ICD-10-CM

## 2018-06-04 DIAGNOSIS — A419 Sepsis, unspecified organism: Principal | ICD-10-CM

## 2018-06-04 DIAGNOSIS — E1149 Type 2 diabetes mellitus with other diabetic neurological complication: Secondary | ICD-10-CM

## 2018-06-04 LAB — COMPREHENSIVE METABOLIC PANEL
ALT: 13 U/L (ref 0–44)
AST: 14 U/L — AB (ref 15–41)
Albumin: 2.2 g/dL — ABNORMAL LOW (ref 3.5–5.0)
Alkaline Phosphatase: 107 U/L (ref 38–126)
Anion gap: 14 (ref 5–15)
BILIRUBIN TOTAL: 0.9 mg/dL (ref 0.3–1.2)
BUN: 61 mg/dL — AB (ref 6–20)
CO2: 20 mmol/L — ABNORMAL LOW (ref 22–32)
CREATININE: 3.11 mg/dL — AB (ref 0.61–1.24)
Calcium: 8.5 mg/dL — ABNORMAL LOW (ref 8.9–10.3)
Chloride: 104 mmol/L (ref 98–111)
GFR calc Af Amer: 24 mL/min — ABNORMAL LOW (ref >60)
GFR calc non Af Amer: 21 mL/min — ABNORMAL LOW (ref >60)
Glucose, Bld: 128 mg/dL — ABNORMAL HIGH (ref 70–99)
Potassium: 4.4 mmol/L (ref 3.5–5.1)
Sodium: 138 mmol/L (ref 135–145)
Total Protein: 6.5 g/dL (ref 6.5–8.1)

## 2018-06-04 LAB — CBC
HCT: 25.7 % — ABNORMAL LOW (ref 39.0–52.0)
Hemoglobin: 7.8 g/dL — ABNORMAL LOW (ref 13.0–17.0)
MCH: 27.1 pg (ref 26.0–34.0)
MCHC: 30.4 g/dL (ref 30.0–36.0)
MCV: 89.2 fL (ref 80.0–100.0)
PLATELETS: 172 10*3/uL (ref 150–400)
RBC: 2.88 MIL/uL — ABNORMAL LOW (ref 4.22–5.81)
RDW: 16.1 % — AB (ref 11.5–15.5)
WBC: 15.1 10*3/uL — ABNORMAL HIGH (ref 4.0–10.5)
nRBC: 0 % (ref 0.0–0.2)

## 2018-06-04 LAB — GLUCOSE, CAPILLARY
Glucose-Capillary: 64 mg/dL — ABNORMAL LOW (ref 70–99)
Glucose-Capillary: 85 mg/dL (ref 70–99)
Glucose-Capillary: 120 mg/dL — ABNORMAL HIGH (ref 70–99)
Glucose-Capillary: 102 mg/dL — ABNORMAL HIGH (ref 70–99)
Glucose-Capillary: 123 mg/dL — ABNORMAL HIGH (ref 70–99)

## 2018-06-04 LAB — SURGICAL PCR SCREEN
MRSA, PCR: POSITIVE — AB
Staphylococcus aureus: POSITIVE — AB

## 2018-06-04 LAB — HIV ANTIBODY (ROUTINE TESTING W REFLEX): HIV Screen 4th Generation wRfx: NONREACTIVE

## 2018-06-04 LAB — HEMOGLOBIN A1C
Hgb A1c MFr Bld: 8 % — ABNORMAL HIGH (ref 4.8–5.6)
Mean Plasma Glucose: 182.9 mg/dL

## 2018-06-04 LAB — PROTIME-INR
INR: 1.18
Prothrombin Time: 14.8 seconds (ref 11.4–15.2)

## 2018-06-04 MED ORDER — METRONIDAZOLE 500 MG PO TABS
500.0000 mg | ORAL_TABLET | Freq: Three times a day (TID) | ORAL | Status: DC
  Administered 2018-06-04 – 2018-06-06 (×6): 500 mg via ORAL
  Filled 2018-06-04 (×6): qty 1

## 2018-06-04 MED ORDER — DEXTROSE 50 % IV SOLN
INTRAVENOUS | Status: AC
  Filled 2018-06-04: qty 50

## 2018-06-04 MED ORDER — SODIUM CHLORIDE 0.9 % IV SOLN
1.0000 g | INTRAVENOUS | Status: DC
  Administered 2018-06-04: 1 g via INTRAVENOUS
  Filled 2018-06-04 (×2): qty 1

## 2018-06-04 MED ORDER — IPRATROPIUM-ALBUTEROL 0.5-2.5 (3) MG/3ML IN SOLN
3.0000 mL | Freq: Three times a day (TID) | RESPIRATORY_TRACT | Status: DC
  Filled 2018-06-04 (×4): qty 3

## 2018-06-04 MED ORDER — LORAZEPAM 2 MG/ML IJ SOLN
0.5000 mg | Freq: Four times a day (QID) | INTRAMUSCULAR | Status: DC | PRN
  Administered 2018-06-06 – 2018-06-08 (×4): 0.5 mg via INTRAVENOUS
  Filled 2018-06-04 (×4): qty 1

## 2018-06-04 MED ORDER — MUPIROCIN 2 % EX OINT
1.0000 "application " | TOPICAL_OINTMENT | Freq: Two times a day (BID) | CUTANEOUS | Status: AC
  Administered 2018-06-04 – 2018-06-08 (×10): 1 via NASAL
  Filled 2018-06-04 (×3): qty 22

## 2018-06-04 MED ORDER — CHLORHEXIDINE GLUCONATE CLOTH 2 % EX PADS
6.0000 | MEDICATED_PAD | Freq: Every day | CUTANEOUS | Status: AC
  Administered 2018-06-04 – 2018-06-08 (×5): 6 via TOPICAL

## 2018-06-04 MED ORDER — DEXTROSE 50 % IV SOLN
INTRAVENOUS | Status: AC
  Administered 2018-06-04: 12.5 mL
  Filled 2018-06-04: qty 50

## 2018-06-04 NOTE — Progress Notes (Addendum)
PROGRESS NOTE    Gregory [name]  WEX:[id number] DOB: 02-20-1961 DOA: 06/03/2018 PCP: [name], MD   Brief Narrative: Patient is a 57 year old male with past medical history of diabetes mellitus type 2, essential hypertension, chronic diastolic CHF, ureteral stricture status post recent cystoscopy and urethral dilation, chronic kidney disease stage III, right sided BKA who presented to the emergency department with complaints of pain, swelling, development of blebs/bulla on his left lower extremity.  He was discharged on oral antibiotics at his nursing facility but his symptoms got worse and he also started having fever.  Imagings done in the emergency department suggestive of osteomyelitis in the left foot.  ID and orthopedics consulted.  Assessment & Plan:   Principal Problem:   Acute osteomyelitis of left foot (HCC) Active Problems:   COPD (chronic obstructive pulmonary disease) (HCC)   Chronic diastolic CHF (congestive heart failure) (HCC)   Type II diabetes mellitus with neurological manifestations (HCC)   Chronic pain   Depression, recurrent (HCC)   Septic arthritis (HCC)  Acute osteomyelitis/septic arthritis of the left foot/possible necrotizing fasciitis: X-ray showed: destructive changes of the proximal and middle phalanges of the left second digit suggesting osteomyelitis. Septic arthritis at the proximal interphalangeal joint space may be present. Structures changes in the distal tip of the distal phalanx of the left second digit suggesting osteomyelitis Patient's left lower extremity is significantly swollen, tender and has bullae.  High suspicion for necrotizing fasciitis and need to rule out.  Orthopedics/ID seeing .Will order stat CT left lower extremity without contrast. Continue vancomycin and Zosyn for now.  [name] planning for surgery today. Follow-up blood cultures.  Has leukocytosis, will follow up trend.  He is afebrile now.  Acute kidney injury on chronic  kidney disease stage III: Looked dehydrated on presentation.  His baseline creatinine runs around 1.5-2.  Hold nephrotoxic agents.  Diuretics on hold.  Continue gentle hydration.  Creatinine is still in the range of 3 today.  Diabetes mellitus type 2: On insulin at home.  Hemoglobin A1c of 8.  Continue sliding his insulin here.  Normocytic anemia: Hemoglobin dropped most likely secondary to hemodilution.  Anemia secondary to chronic disease.  No evidence of bleeding.  We will continue to monitor CBC.  Chronic diastolic CHF: Diuretics on hold.  Patient appeared hypovolemic on presentation.  Continue gentle hydration.  Steroid use: He was started on prednisone about a month ago for unclear reason.  He was on 40 mg daily which has been cut to 10 mg ,we will continue to taper and stop it.  History of ureteral stricture: Recently underwent dilation of the ureteral stricture by Dr. Karsten Ro.  Has been urinating fine.  Continue Flomax  Chronic pain syndrome: Continue current pain regimen.         DVT prophylaxis:None for now for the anticipation of surgery Code Status:Full  Family Communication: Discussed with family by the bedside Disposition Plan: Undetermined at this point.  Needs to have surgery.   Consultants: Orthopedics, ID  Procedures: None  Antimicrobials: Vancomycin Zosyn day 2  Subjective: Patient seen and examined the bedside today in the morning.  He is hemodynamically stable at present.  He is afebrile during my evaluation.  Continues to complain of discomfort on the left lower extremity.  Complains of dry mouth because he is unable to eat for upcoming surgery.  Objective: Vitals:   06/03/18 2150 06/03/18 2302 06/04/18 0519 06/04/18 0900  BP: 127/71  134/68 123/69  Pulse: 85  87 91  Resp: 18  18   Temp: 99 F (37.2 C)  98.4 F (36.9 C)   TempSrc:   Oral   SpO2: 99%  99%   Height:  6' (1.829 m)      Intake/Output Summary (Last 24 hours) at 06/04/2018 0906 Last  data filed at 06/04/2018 0616 Gross per 24 hour  Intake 1492.43 ml  Output 575 ml  Net 917.43 ml   There were no vitals filed for this visit.  Examination:  General exam: Not in distress,obese HEENT:PERRL,Oral mucosa moist, Ear/Nose normal on gross exam Respiratory system: Bilateral equal air entry, normal vesicular breath sounds, no wheezes or crackles  Cardiovascular system: S1 & S2 heard, RRR. No JVD, murmurs, rubs, gallops or clicks.  Gastrointestinal system: Abdomen is mildly distended, soft and nontender. No organomegaly or masses felt. Normal bowel sounds heard. Central nervous system: Alert and oriented. No focal neurological deficits. Extremities: No cyanosis, severe edema, erythema, tenderness of the left lower extremity with blebs/bullae .  Left second toe dark in color.  Right-sided BKA skin: No rashes, lesions or ulcers,no icterus ,no pallor Psychiatry: Judgement and insight appear normal. Mood & affect appropriate.     Data Reviewed: I have personally reviewed following labs and imaging studies  CBC: Recent Labs  Lab 06/03/18 1333 06/03/18 1341 06/04/18 0200  WBC 14.9*  --  15.1*  NEUTROABS 13.0*  --   --   HGB 8.7* 9.2* 7.8*  HCT 28.6* 27.0* 25.7*  MCV 90.8  --  89.2  PLT 178  --  737   Basic Metabolic Panel: Recent Labs  Lab 06/03/18 1333 06/03/18 1341 06/04/18 0200  NA 133* 136 138  K 4.8 4.8 4.4  CL 100 104 104  CO2 20*  --  20*  GLUCOSE 170* 174* 128*  BUN 66* 60* 61*  CREATININE 3.24* 3.30* 3.11*  CALCIUM 8.4*  --  8.5*   GFR: CrCl cannot be calculated (Unknown ideal weight.). Liver Function Tests: Recent Labs  Lab 06/03/18 1333 06/04/18 0200  AST 15 14*  ALT 13 13  ALKPHOS 114 107  BILITOT 0.9 0.9  PROT 6.6 6.5  ALBUMIN 2.8* 2.2*   No results for input(s): LIPASE, AMYLASE in the last 168 hours. No results for input(s): AMMONIA in the last 168 hours. Coagulation Profile: Recent Labs  Lab 06/04/18 0200  INR 1.18   Cardiac  Enzymes: No results for input(s): CKTOTAL, CKMB, CKMBINDEX, TROPONINI in the last 168 hours. BNP (last 3 results) No results for input(s): PROBNP in the last 8760 hours. HbA1C: Recent Labs    06/04/18 0200  HGBA1C 8.0*   CBG: Recent Labs  Lab 06/03/18 1356 06/03/18 2216 06/04/18 0809  GLUCAP 154* 192* 64*   Lipid Profile: No results for input(s): CHOL, HDL, LDLCALC, TRIG, CHOLHDL, LDLDIRECT in the last 72 hours. Thyroid Function Tests: No results for input(s): TSH, T4TOTAL, FREET4, T3FREE, THYROIDAB in the last 72 hours. Anemia Panel: No results for input(s): VITAMINB12, FOLATE, FERRITIN, TIBC, IRON, RETICCTPCT in the last 72 hours. Sepsis Labs: Recent Labs  Lab 06/03/18 1341  LATICACIDVEN 1.02    Recent Results (from the past 240 hour(s))  Blood culture (routine x 2)     Status: None (Preliminary result)   Collection Time: 06/03/18  1:32 PM  Result Value Ref Range Status   Specimen Description   Final    BLOOD RIGHT ANTECUBITAL Performed at [facility] [address], [place], [place] [address]    Special Requests  Final    BOTTLES DRAWN AEROBIC AND ANAEROBIC Blood Culture results may not be optimal due to an excessive volume of blood received in culture bottles Performed at [facility] [address] [place], [place] [address]    Culture   Final    NO GROWTH < 24 HOURS Performed at [facility] [address], [place], [place] [address]    Report Status PENDING  Incomplete  Blood culture (routine x 2)     Status: None (Preliminary result)   Collection Time: 06/03/18  1:45 PM  Result Value Ref Range Status   Specimen Description   Final    BLOOD LEFT WRIST Performed at [facility] [address] [place], [place] [address]    Special Requests   Final    BOTTLES DRAWN AEROBIC AND ANAEROBIC Blood Culture adequate volume Performed at [facility] [address],  [place], [place] [address]    Culture   Final    NO GROWTH < 24 HOURS Performed at [facility] [address] [place], [place] [address]    Report Status PENDING  Incomplete  Surgical pcr screen     Status: Abnormal   Collection Time: 06/03/18 10:51 PM  Result Value Ref Range Status   MRSA, PCR POSITIVE (A) NEGATIVE Final    Comment: RESULT CALLED TO, READ BACK BY AND VERIFIED WITHBeulah Gandy RN [phone] 06/04/18 A BROWNING    Staphylococcus aureus POSITIVE (A) NEGATIVE Final    Comment: (NOTE) The Xpert SA Assay (FDA approved for NASAL specimens in patients 17 years of age and older), is one component of a comprehensive surveillance program. It is not intended to diagnose infection nor to guide or monitor treatment. Performed at [facility], [place] [address] [place], [place] [address]          Radiology Studies: Dg Chest 2 View  Result Date: 06/03/2018 CLINICAL DATA:  Rhonchi EXAM: CHEST - 2 VIEW COMPARISON:  11/26/2015 FINDINGS: Generalized interstitial coarsening without Kerley lines or effusion. Heart size is normal. No collapse or consolidation. IMPRESSION: Bronchitic markings without focal pneumonia. Electronically Signed   By: Monte Fantasia M.D.   On: 06/03/2018 14:47   Dg Tibia/fibula Left  Result Date: 06/03/2018 CLINICAL DATA:  Worsening left leg infection x3 days. EXAM: LEFT TIBIA AND FIBULA - 2 VIEW COMPARISON:  None. FINDINGS: Overlapping AP and lateral views of the left tibia and fibula are provided. Soft tissue nodularity is seen along the lateral aspect of the knee and leg with moderate soft tissue swelling noted about the ankle. No acute fracture, bone destruction or suspicious osseous lesions. Osteoarthritic joint space narrowing of the medial femorotibial compartment with spurring of the tibial spine is identified. No joint effusion is seen. IMPRESSION: 1. Soft tissue nodularity along the lateral aspect of the knee and leg with moderate soft tissue  swelling about the ankle. 2. 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 3. No acute osseous abnormality. No evidence of bone destruction/osteomyelitis. Electronically Signed   By: Ashley Royalty M.D.   On: 06/03/2018 14:52   Dg Foot Complete Left  Result Date: 06/03/2018 CLINICAL DATA:  Worsening infection. EXAM: LEFT FOOT - COMPLETE 3+ VIEW COMPARISON:  MRI 03/26/2017.  Left foot series 10/02/2016. FINDINGS: Prior amputation left first MTP. Destructive changes noted of the distal aspect of the proximal phalanx and proximal aspect of the middle phalanx of the left second digit. Destructive changes  noted of the distal portion of the distal phalanx of the left second digit. These findings are suggestive of osteomyelitis. No other focal bony abnormality identified. IMPRESSION: 1. Destructive changes of the proximal and middle phalanges of the left second digit suggesting osteomyelitis. Septic arthritis at the proximal interphalangeal joint space may be present. 2. Structures changes in the distal tip of the distal phalanx of the left second digit suggesting osteomyelitis. Electronically Signed   By: Marcello Moores  Register   On: 06/03/2018 14:50        Scheduled Meds: . atorvastatin  20 mg Oral QHS  . buPROPion  300 mg Oral QHS  . Chlorhexidine Gluconate Cloth  6 each Topical Q0600  . dextrose      . finasteride  5 mg Oral Daily  . insulin aspart  0-15 Units Subcutaneous TID WC  . ipratropium-albuterol  3 mL Nebulization Once  . ipratropium-albuterol  3 mL Nebulization Q6H  . loratadine  10 mg Oral Daily  . metoprolol tartrate  25 mg Oral Daily  . morphine  15 mg Oral Q12H  . mupirocin ointment  1 application Nasal BID  . PARoxetine  20 mg Oral QHS  . predniSONE  10 mg Oral Q breakfast  . pregabalin  150 mg Oral BID  . senna-docusate  2 tablet Oral QHS  . tamsulosin  0.8 mg Oral QPC supper  . vitamin B-12  1,000 mcg Oral Daily   Continuous Infusions: . sodium chloride 75  mL/hr at 06/03/18 2202  . piperacillin-tazobactam (ZOSYN)  IV 3.375 g (06/04/18 0518)  . [START ON 06/05/2018] vancomycin       LOS: 1 day    Time spent: 35 mins.More than 50% of that time was spent in counseling and/or coordination of care.      Shelly Coss, MD [facility] Pager [phone]  If 7PM-7AM, please contact night-coverage [email or web address] Password [email or web address] 06/04/2018, 9:06 AM

## 2018-06-04 NOTE — Progress Notes (Signed)
MRI of femur and tib/fib attempted. Pt only able to tolerate minimal imaging of femur before becoming anxious and stating he could not do anymore today. Informed RN and Dr Tawanna Solo. Will submit the limited imaging of the femur for radiologist reading.

## 2018-06-04 NOTE — H&P (View-Only) (Signed)
ORTHOPAEDIC CONSULTATION  REQUESTING PHYSICIAN: Shelly Coss, MD  Chief Complaint: Cellulitis left lower extremity.  HPI: Gregory [name] is a 57 y.o. male who presents with cellulitis of the entire left lower extremity.  Patient is status post first ray amputation and has osteomyelitis of his second toe.  Past Medical History:  Diagnosis Date  . Acquired absence of left great toe ([place])   . Acquired absence of right leg below knee ([place])   . Acute osteomyelitis of calcaneum, right ([place]) 10/02/2016  . Acute respiratory failure ([place])   . Anemia   . Basal cell carcinoma, eyelid   . Cancer ([place])   . CHF (congestive heart failure) (HCC)    chronic diastolic   . Chronic back pain   . Chronic kidney disease    stage 3   . Chronic multifocal osteomyelitis (HCC)    of ankle and foot   . Chronic pain syndrome   . COPD (chronic obstructive pulmonary disease) ([place])   . DDD (degenerative disc disease), lumbar   . Depression    major depressive disorder   . Diabetes mellitus without complication ([place])    type 2   . Diabetic ulcer of toe of left foot ([place]) 10/02/2016  . Difficulty in walking, not elsewhere classified   . 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 Foot drop, bilateral   . Foot drop, right 12/27/2015  . GERD (gastroesophageal reflux disease)   . Herpesviral vesicular dermatitis   . Hyperlipidemia   . Hyperlipidemia   . Hypertension   . Insomnia   . Malignant melanoma of other parts of face ([place])   . MRSA bacteremia   . Muscle weakness (generalized)   . Nasal congestion   . Neuromuscular dysfunction of bladder   . Osteomyelitis ([place])   . Osteomyelitis ([place])   . Other idiopathic peripheral autonomic neuropathy   . Retention of urine, unspecified   . Squamous cell carcinoma of skin   . Unsteadiness on feet   . Urinary retention   . Urinary retention   . Urinary tract infection    Past Surgical History:  Procedure Laterality Date  . AMPUTATION Left 03/29/2017   Procedure:  LEFT GREAT TOE AMPUTATION AT METATARSOPHALANGEAL JOINT;  Surgeon: [name], MD;  Location: [facility];  Service: Orthopedics;  Laterality: Left;  . AMPUTATION Right 03/29/2017   Procedure: RIGHT BELOW KNEE AMPUTATION;  Surgeon: [name], MD;  Location: [facility];  Service: Orthopedics;  Laterality: Right;  . ANTERIOR CERVICAL CORPECTOMY N/A 11/25/2015   Procedure: ANTERIOR CERVICAL FIVE CORPECTOMY Cervical four - six fusion;  Surgeon: Consuella Lose, MD;  Location: [facility];  Service: Neurosurgery;  Laterality: N/A;  ANTERIOR CERVICAL FIVE CORPECTOMY Cervical four - six fusion  . CYSTOSCOPY WITH RETROGRADE URETHROGRAM N/A 04/25/2018   Procedure: CYSTOSCOPY WITH RETROGRADE URETHROGRAM/ BALLOON DILATION;  Surgeon: Kathie Rhodes, MD;  Location: [facility];  Service: Urology;  Laterality: N/A;  . POSTERIOR CERVICAL FUSION/FORAMINOTOMY N/A 11/29/2015   Procedure: Cervical Three-Cervical Seven Posterior Cervical Laminectomy with Fusion;  Surgeon: Consuella Lose, MD;  Location: [facility];  Service: Neurosurgery;  Laterality: N/A;  Cervical Three-Cervical Seven Posterior Cervical Laminectomy with Fusion   Social History   Socioeconomic History  . Marital status: Divorced    Spouse name: Not on file  . Number of children: Not on file  . Years of education: Not on file  . Highest education level: Not on file  Occupational History  . Not on file  Social Needs  .  Financial resource strain: Not on file  . Food insecurity:    Worry: Not on file    Inability: Not on file  . Transportation needs:    Medical: Not on file    Non-medical: Not on file  Tobacco Use  . Smoking status: Former Smoker    Packs/day: 3.00    Types: Cigarettes  . Smokeless tobacco: Never Used  Substance and Sexual Activity  . Alcohol use: No  . Drug use: No  . Sexual activity: Not on file  Lifestyle  . Physical activity:    Days per week: Not on file    Minutes per session: Not on file  . Stress: Not on file    Relationships  . Social connections:    Talks on phone: Not on file    Gets together: Not on file    Attends religious service: Not on file    Active member of club or organization: Not on file    Attends meetings of clubs or organizations: Not on file    Relationship status: Not on file  Other Topics Concern  . Not on file  Social History Narrative  . Not on file   Family History  Problem Relation Age of Onset  . Cancer Other   . Diabetes Other    - negative except otherwise stated in the family history section Allergies  Allergen Reactions  . Latex Other (See Comments)    Unknown - per [facility]   Prior to Admission medications   Medication Sig Start Date End Date Taking? Authorizing Provider  acetaminophen (TYLENOL) 325 MG tablet Take 650 mg by mouth every 4 (four) hours as needed for moderate pain.   Yes [provider]  albuterol (PROVENTIL HFA;VENTOLIN HFA) 108 (90 Base) MCG/ACT inhaler Inhale 2 puffs into the lungs every 6 (six) hours as needed for wheezing or shortness of breath.   Yes [provider]  antiseptic oral rinse (BIOTENE) LIQD 15 mLs by Mouth Rinse route every 4 (four) hours as needed for dry mouth.    Yes [provider]  atorvastatin (LIPITOR) 20 MG tablet Take 20 mg by mouth at bedtime.   Yes [provider]  azithromycin (ZITHROMAX) 250 MG tablet Take 250 mg by mouth daily. 06/03/18 06/07/18 Yes [provider]  Bacitracin-Polymyxin B (POLY BACITRACIN) 500-10000 UNIT/GM OINT Place 1 application into the right eye every 6 (six) hours. 05/29/18 06/04/18 Yes [provider]  bisacodyl (DULCOLAX) 10 MG suppository Place 1 suppository (10 mg total) rectally daily as needed for moderate constipation. 12/02/15  Yes [name], MD  buPROPion (WELLBUTRIN XL) 300 MG 24 hr tablet Take 300 mg by mouth at bedtime.   Yes [provider]  calcium carbonate (TUMS - DOSED IN MG ELEMENTAL CALCIUM) 500 MG chewable tablet  Chew 2 tablets by mouth every 8 (eight) hours as needed for indigestion or heartburn.   Yes [provider]  cephALEXin (KEFLEX) 500 MG capsule Take 500 mg by mouth every 12 (twelve) hours. 06/02/18 06/09/18 Yes [provider]  cetirizine (ZYRTEC) 10 MG tablet Take 10 mg by mouth daily.   Yes [provider]  Cholecalciferol (VITAMIN D-3) 5000 units TABS Take 5,000 Units by mouth daily.   Yes [provider]  diphenhydrAMINE (BENADRYL) 25 MG tablet Take 1 tablet (25 mg total) by mouth every 6 (six) hours. 02/23/17  Yes Jola Schmidt, MD  eszopiclone (LUNESTA) 2 MG TABS tablet Take 2 mg by mouth at bedtime. Take immediately  before bedtime   Yes [provider]  finasteride (PROSCAR) 5 MG tablet Take 5 mg by mouth daily.   Yes [provider]  furosemide (LASIX) 20 MG tablet Take 20 mg by mouth daily.    Yes [provider]  gemfibrozil (LOPID) 600 MG tablet Take 600 mg by mouth every 12 (twelve) hours.   Yes [provider]  glipiZIDE (GLUCOTROL) 10 MG tablet Take 10 mg by mouth daily before breakfast.   Yes [provider]  glucagon (GLUCAGON EMERGENCY) 1 MG injection Inject 1 mg into the muscle once as needed (for CBG <70).    Yes [provider]  guaiFENesin (MUCINEX) 600 MG 12 hr tablet Take 600 mg by mouth every 12 (twelve) hours as needed for cough (congestion).  04/23/18  Yes [provider]  guaiFENesin (MUCINEX) 600 MG 12 hr tablet Take 600 mg by mouth 2 (two) times daily. 05/27/18 06/06/18 Yes [provider]  insulin lispro (HUMALOG KWIKPEN) 100 UNIT/ML KwikPen Inject 0-10 Units into the skin See admin instructions. Inject per sliding scale 3 times daily before meals; CBG <60 = notify MD, 0-150 = 0 units, 151-200 = 2 units, 201-250 = 4 units, 251-300 = 6 units, 301-350 = 8 units, 351-400 = 10 units, >400 = notify MD   Yes [provider]  ipratropium-albuterol (DUONEB) 0.5-2.5  (3) MG/3ML SOLN Take 3 mLs by nebulization every 6 (six) hours.  06/02/18 06/07/18 Yes [provider]  ipratropium-albuterol (DUONEB) 0.5-2.5 (3) MG/3ML SOLN Take 3 mLs by nebulization every 6 (six) hours as needed (congestion/SOB).   Yes [provider]  lidocaine (LIDODERM) 5 % Place 1 patch onto the skin daily. Remove & Discard patch within 12 hours or as directed by MD   Yes [provider]  loratadine (CLARITIN) 10 MG tablet Take 10 mg by mouth daily. 06/02/18 06/09/18 Yes [provider]  magnesium oxide (MAG-OX) 400 MG tablet Take 400 mg by mouth 2 (two) times daily.   Yes [provider]  metoprolol tartrate (LOPRESSOR) 25 MG tablet Take 25 mg by mouth daily.   Yes [provider]  mometasone (NASONEX) 50 MCG/ACT nasal spray Place 1 spray into the nose daily.   Yes [provider]  morphine (MS CONTIN) 15 MG 12 hr tablet Take 15 mg by mouth every 12 (twelve) hours.   Yes [provider]  omeprazole (PRILOSEC) 20 MG capsule Take 40 mg by mouth daily.    Yes [provider]  ondansetron (ZOFRAN) 4 MG tablet Take 4 mg by mouth every 6 (six) hours as needed for nausea or vomiting.   Yes [provider]  oxybutynin (DITROPAN XL) 15 MG 24 hr tablet Take 15 mg by mouth at bedtime.   Yes [provider]  oxyCODONE (ROXICODONE) 15 MG immediate release tablet Take 15 mg by mouth every 6 (six) hours as needed for pain.   Yes [provider]  PARoxetine (PAXIL) 20 MG tablet Take 20 mg by mouth at bedtime.   Yes [provider]  predniSONE (DELTASONE) 20 MG tablet Take 40 mg by mouth daily with breakfast. 06/03/18 06/08/18 Yes [provider]  pregabalin (LYRICA) 150 MG capsule Take 150 mg by mouth 2 (two) times daily.   Yes [provider]  senna-docusate (SENOKOT-S) 8.6-50 MG tablet Take 2 tablets by mouth at bedtime.   Yes [provider]  tamsulosin (FLOMAX)  0.4 MG CAPS capsule Take 0.8 mg by mouth every evening.  11/26/13  Yes [provider]  tiZANidine (ZANAFLEX) 2 MG tablet Take 2 mg by mouth every 8 (eight) hours as needed for muscle spasms.   Yes [provider]  vitamin B-12 (CYANOCOBALAMIN) 1000 MCG tablet Take 1,000 mcg by mouth daily.   Yes [provider]   Dg Chest 2 View  Result Date: 06/03/2018 CLINICAL DATA:  [place] EXAM: CHEST - 2 VIEW COMPARISON:  11/26/2015 FINDINGS: Generalized interstitial coarsening without Kerley lines or effusion. Heart size is normal. No collapse or consolidation. IMPRESSION: Bronchitic markings without focal pneumonia. Electronically Signed   By: Monte Fantasia M.D.   On: 06/03/2018 14:47   Dg Tibia/fibula Left  Result Date: 06/03/2018 CLINICAL DATA:  Worsening left leg infection x3 days. EXAM: LEFT TIBIA AND FIBULA - 2 VIEW COMPARISON:  None. FINDINGS: Overlapping AP and lateral views of the left tibia and fibula are provided. Soft tissue nodularity is seen along the lateral aspect of the knee and leg with moderate soft tissue swelling noted about the ankle. No acute fracture, bone destruction or suspicious osseous lesions. Osteoarthritic joint space narrowing of the medial femorotibial compartment with spurring of the tibial spine is identified. No joint effusion is seen. IMPRESSION: 1. Soft tissue nodularity along the lateral aspect of the knee and leg with moderate soft tissue swelling about the ankle. 2. 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 3. No acute osseous abnormality. No evidence of bone destruction/osteomyelitis. Electronically Signed   By: Ashley Royalty M.D.   On: 06/03/2018 14:52   Mr Femur Left Wo Contrast  Result Date: 06/04/2018 CLINICAL DATA:  Erythema and edema of the left thigh EXAM: MR OF THE LEFT FEMUR WITHOUT CONTRAST TECHNIQUE: Multiplanar, multisequence MR imaging of the left femur was performed. No intravenous contrast was  administered. COMPARISON:  None. FINDINGS: Bones/Joint/Cartilage No marrow signal abnormality. No fracture or dislocation. Normal alignment. No joint effusion. No periosteal reaction or bone destruction. No aggressive osseous lesion. Ligaments, Muscles and Tendons Muscles are normal. No muscle atrophy. No intramuscular fluid collection or hematoma. Soft tissue No fluid collection or hematoma. No soft tissue mass. Generalized mild soft tissue edema in the subcutaneous fat surrounding the left thigh. IMPRESSION: 1. Generalized mild soft tissue edema in the subcutaneous fat surrounding the left thigh. Differential and considerations include cellulitis versus venous stasis. Electronically Signed   By: Kathreen Devoid   On: 06/04/2018 13:33   Dg Foot Complete Left  Result Date: 06/03/2018 CLINICAL DATA:  Worsening infection. EXAM: LEFT FOOT - COMPLETE 3+ VIEW COMPARISON:  MRI 03/26/2017.  Left foot series 10/02/2016. FINDINGS: Prior amputation left first MTP. Destructive changes noted of the distal aspect of the proximal phalanx and proximal aspect of the middle phalanx of the left second digit. Destructive changes noted of the distal portion of the distal phalanx of the left second digit. These findings are suggestive of osteomyelitis. No other focal bony abnormality identified. IMPRESSION: 1. Destructive changes of the proximal and middle phalanges of the left second digit suggesting osteomyelitis. Septic arthritis at the proximal interphalangeal joint space may be present. 2. Structures changes in the distal tip of the distal phalanx of the left second digit suggesting osteomyelitis. Electronically Signed   By: Marcello Moores  Register   On: 06/03/2018 14:50   - pertinent xrays, CT, MRI studies were reviewed and independently interpreted  Positive ROS: All other systems have been reviewed and were otherwise negative with the exception of those mentioned in the HPI and as above.  Physical  Exam: General: Alert, no  acute distress Psychiatric: Patient is competent for consent with normal mood and affect Lymphatic: No axillary or cervical lymphadenopathy Cardiovascular: No pedal edema Respiratory: No cyanosis, no use of accessory musculature GI: No organomegaly, abdomen is soft and non-tender    Images:  @ENCIMAGES @  Labs:  Lab Results  Component Value Date   HGBA1C 8.0 (H) 06/04/2018   HGBA1C 7.9 (H) 04/25/2018   HGBA1C 7.2 (H) 03/25/2017   ESRSEDRATE 120 (H) 03/25/2017   ESRSEDRATE 84 (H) 10/02/2016   ESRSEDRATE 132 (H) 12/01/2015   CRP 4.9 (H) 03/25/2017   CRP 30.6 (H) 10/02/2016   CRP 13.7 (H) 11/11/2015   REPTSTATUS PENDING 06/03/2018   GRAMSTAIN  11/11/2015    ABUNDANT WBC PRESENT,BOTH PMN AND MONONUCLEAR RARE SQUAMOUS EPITHELIAL CELLS PRESENT FEW GRAM POSITIVE COCCI IN PAIRS RARE GRAM POSITIVE RODS RARE GRAM NEGATIVE COCCOBACILLI    CULT  06/03/2018    NO GROWTH < 24 HOURS Performed at [facility], [place] [address], [place], [place] [address]    LABORGA PROVIDENCIA [name] (A) 01/12/2017    Lab Results  Component Value Date   ALBUMIN 2.2 (L) 06/04/2018   ALBUMIN 2.8 (L) 06/03/2018   ALBUMIN 3.2 (L) 03/28/2017   PREALBUMIN 23.3 03/25/2017    Neurologic: Patient does not have protective sensation bilateral lower extremities.   MUSCULOSKELETAL:   Skin: Examination patient does have cellulitis that extends up to the groin.  There is blistering of the skin from the foot up to the thigh.  There is no crepitation there is no clinical signs of subcutaneous air.  Patient was examined twice today.  This morning and this afternoon.  Clinically his leg does look improved this afternoon.  Patient does have swelling I cannot palpate a pulse his foot is warm.  No clinical signs of arterial insufficiency.  Review of the MRI scan shows cellulitis does not show any subcutaneous air no findings consistent with necrotizing fasciitis.  Assessment: Assessment: Diabetic insensate  neuropathy with severe protein caloric malnutrition with a stable right transtibial amputation with cellulitis of the entire left lower extremity with osteomyelitis of the second toe  Plan: Plan: We will continue to follow the patient and will reevaluate in the morning.  Clinically patient is showing improvement in the cellulitis and will make a determination for surgery for Friday.  Thank you for the consult and the opportunity to see Mr. Camille Thau, [place] [phone] 3:27 PM

## 2018-06-04 NOTE — Consult Note (Signed)
ORTHOPAEDIC CONSULTATION  REQUESTING PHYSICIAN: Shelly Coss, MD  Chief Complaint: Cellulitis left lower extremity.  HPI: Gregory [name] is a 57 y.o. male who presents with cellulitis of the entire left lower extremity.  Patient is status post first ray amputation and has osteomyelitis of his second toe.  Past Medical History:  Diagnosis Date  . Acquired absence of left great toe ([place])   . Acquired absence of right leg below knee ([place])   . Acute osteomyelitis of calcaneum, right ([place]) 10/02/2016  . Acute respiratory failure ([place])   . Anemia   . Basal cell carcinoma, eyelid   . Cancer ([place])   . CHF (congestive heart failure) (HCC)    chronic diastolic   . Chronic back pain   . Chronic kidney disease    stage 3   . Chronic multifocal osteomyelitis (HCC)    of ankle and foot   . Chronic pain syndrome   . COPD (chronic obstructive pulmonary disease) ([place])   . DDD (degenerative disc disease), lumbar   . Depression    major depressive disorder   . Diabetes mellitus without complication ([place])    type 2   . Diabetic ulcer of toe of left foot ([place]) 10/02/2016  . Difficulty in walking, not elsewhere classified   . 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 Foot drop, bilateral   . Foot drop, right 12/27/2015  . GERD (gastroesophageal reflux disease)   . Herpesviral vesicular dermatitis   . Hyperlipidemia   . Hyperlipidemia   . Hypertension   . Insomnia   . Malignant melanoma of other parts of face ([place])   . MRSA bacteremia   . Muscle weakness (generalized)   . Nasal congestion   . Neuromuscular dysfunction of bladder   . Osteomyelitis ([place])   . Osteomyelitis ([place])   . Other idiopathic peripheral autonomic neuropathy   . Retention of urine, unspecified   . Squamous cell carcinoma of skin   . Unsteadiness on feet   . Urinary retention   . Urinary retention   . Urinary tract infection    Past Surgical History:  Procedure Laterality Date  . AMPUTATION Left 03/29/2017   Procedure:  LEFT GREAT TOE AMPUTATION AT METATARSOPHALANGEAL JOINT;  Surgeon: [name], MD;  Location: [facility];  Service: Orthopedics;  Laterality: Left;  . AMPUTATION Right 03/29/2017   Procedure: RIGHT BELOW KNEE AMPUTATION;  Surgeon: [name], MD;  Location: [facility];  Service: Orthopedics;  Laterality: Right;  . ANTERIOR CERVICAL CORPECTOMY N/A 11/25/2015   Procedure: ANTERIOR CERVICAL FIVE CORPECTOMY Cervical four - six fusion;  Surgeon: Consuella Lose, MD;  Location: [facility];  Service: Neurosurgery;  Laterality: N/A;  ANTERIOR CERVICAL FIVE CORPECTOMY Cervical four - six fusion  . CYSTOSCOPY WITH RETROGRADE URETHROGRAM N/A 04/25/2018   Procedure: CYSTOSCOPY WITH RETROGRADE URETHROGRAM/ BALLOON DILATION;  Surgeon: Kathie Rhodes, MD;  Location: [facility];  Service: Urology;  Laterality: N/A;  . POSTERIOR CERVICAL FUSION/FORAMINOTOMY N/A 11/29/2015   Procedure: Cervical Three-Cervical Seven Posterior Cervical Laminectomy with Fusion;  Surgeon: Consuella Lose, MD;  Location: [facility];  Service: Neurosurgery;  Laterality: N/A;  Cervical Three-Cervical Seven Posterior Cervical Laminectomy with Fusion   Social History   Socioeconomic History  . Marital status: Divorced    Spouse name: Not on file  . Number of children: Not on file  . Years of education: Not on file  . Highest education level: Not on file  Occupational History  . Not on file  Social Needs  .  Financial resource strain: Not on file  . Food insecurity:    Worry: Not on file    Inability: Not on file  . Transportation needs:    Medical: Not on file    Non-medical: Not on file  Tobacco Use  . Smoking status: Former Smoker    Packs/day: 3.00    Types: Cigarettes  . Smokeless tobacco: Never Used  Substance and Sexual Activity  . Alcohol use: No  . Drug use: No  . Sexual activity: Not on file  Lifestyle  . Physical activity:    Days per week: Not on file    Minutes per session: Not on file  . Stress: Not on file    Relationships  . Social connections:    Talks on phone: Not on file    Gets together: Not on file    Attends religious service: Not on file    Active member of club or organization: Not on file    Attends meetings of clubs or organizations: Not on file    Relationship status: Not on file  Other Topics Concern  . Not on file  Social History Narrative  . Not on file   Family History  Problem Relation Age of Onset  . Cancer Other   . Diabetes Other    - negative except otherwise stated in the family history section Allergies  Allergen Reactions  . Latex Other (See Comments)    Unknown - per [facility]   Prior to Admission medications   Medication Sig Start Date End Date Taking? Authorizing Provider  acetaminophen (TYLENOL) 325 MG tablet Take 650 mg by mouth every 4 (four) hours as needed for moderate pain.   Yes [provider]  albuterol (PROVENTIL HFA;VENTOLIN HFA) 108 (90 Base) MCG/ACT inhaler Inhale 2 puffs into the lungs every 6 (six) hours as needed for wheezing or shortness of breath.   Yes [provider]  antiseptic oral rinse (BIOTENE) LIQD 15 mLs by Mouth Rinse route every 4 (four) hours as needed for dry mouth.    Yes [provider]  atorvastatin (LIPITOR) 20 MG tablet Take 20 mg by mouth at bedtime.   Yes [provider]  azithromycin (ZITHROMAX) 250 MG tablet Take 250 mg by mouth daily. 06/03/18 06/07/18 Yes [provider]  Bacitracin-Polymyxin B (POLY BACITRACIN) 500-10000 UNIT/GM OINT Place 1 application into the right eye every 6 (six) hours. 05/29/18 06/04/18 Yes [provider]  bisacodyl (DULCOLAX) 10 MG suppository Place 1 suppository (10 mg total) rectally daily as needed for moderate constipation. 12/02/15  Yes [name], MD  buPROPion (WELLBUTRIN XL) 300 MG 24 hr tablet Take 300 mg by mouth at bedtime.   Yes [provider]  calcium carbonate (TUMS - DOSED IN MG ELEMENTAL CALCIUM) 500 MG chewable tablet  Chew 2 tablets by mouth every 8 (eight) hours as needed for indigestion or heartburn.   Yes [provider]  cephALEXin (KEFLEX) 500 MG capsule Take 500 mg by mouth every 12 (twelve) hours. 06/02/18 06/09/18 Yes [provider]  cetirizine (ZYRTEC) 10 MG tablet Take 10 mg by mouth daily.   Yes [provider]  Cholecalciferol (VITAMIN D-3) 5000 units TABS Take 5,000 Units by mouth daily.   Yes [provider]  diphenhydrAMINE (BENADRYL) 25 MG tablet Take 1 tablet (25 mg total) by mouth every 6 (six) hours. 02/23/17  Yes Jola Schmidt, MD  eszopiclone (LUNESTA) 2 MG TABS tablet Take 2 mg by mouth at bedtime. Take immediately  before bedtime   Yes [provider]  finasteride (PROSCAR) 5 MG tablet Take 5 mg by mouth daily.   Yes [provider]  furosemide (LASIX) 20 MG tablet Take 20 mg by mouth daily.    Yes [provider]  gemfibrozil (LOPID) 600 MG tablet Take 600 mg by mouth every 12 (twelve) hours.   Yes [provider]  glipiZIDE (GLUCOTROL) 10 MG tablet Take 10 mg by mouth daily before breakfast.   Yes [provider]  glucagon (GLUCAGON EMERGENCY) 1 MG injection Inject 1 mg into the muscle once as needed (for CBG <70).    Yes [provider]  guaiFENesin (MUCINEX) 600 MG 12 hr tablet Take 600 mg by mouth every 12 (twelve) hours as needed for cough (congestion).  04/23/18  Yes [provider]  guaiFENesin (MUCINEX) 600 MG 12 hr tablet Take 600 mg by mouth 2 (two) times daily. 05/27/18 06/06/18 Yes [provider]  insulin lispro (HUMALOG KWIKPEN) 100 UNIT/ML KwikPen Inject 0-10 Units into the skin See admin instructions. Inject per sliding scale 3 times daily before meals; CBG <60 = notify MD, 0-150 = 0 units, 151-200 = 2 units, 201-250 = 4 units, 251-300 = 6 units, 301-350 = 8 units, 351-400 = 10 units, >400 = notify MD   Yes [provider]  ipratropium-albuterol (DUONEB) 0.5-2.5  (3) MG/3ML SOLN Take 3 mLs by nebulization every 6 (six) hours.  06/02/18 06/07/18 Yes [provider]  ipratropium-albuterol (DUONEB) 0.5-2.5 (3) MG/3ML SOLN Take 3 mLs by nebulization every 6 (six) hours as needed (congestion/SOB).   Yes [provider]  lidocaine (LIDODERM) 5 % Place 1 patch onto the skin daily. Remove & Discard patch within 12 hours or as directed by MD   Yes [provider]  loratadine (CLARITIN) 10 MG tablet Take 10 mg by mouth daily. 06/02/18 06/09/18 Yes [provider]  magnesium oxide (MAG-OX) 400 MG tablet Take 400 mg by mouth 2 (two) times daily.   Yes [provider]  metoprolol tartrate (LOPRESSOR) 25 MG tablet Take 25 mg by mouth daily.   Yes [provider]  mometasone (NASONEX) 50 MCG/ACT nasal spray Place 1 spray into the nose daily.   Yes [provider]  morphine (MS CONTIN) 15 MG 12 hr tablet Take 15 mg by mouth every 12 (twelve) hours.   Yes [provider]  omeprazole (PRILOSEC) 20 MG capsule Take 40 mg by mouth daily.    Yes [provider]  ondansetron (ZOFRAN) 4 MG tablet Take 4 mg by mouth every 6 (six) hours as needed for nausea or vomiting.   Yes [provider]  oxybutynin (DITROPAN XL) 15 MG 24 hr tablet Take 15 mg by mouth at bedtime.   Yes [provider]  oxyCODONE (ROXICODONE) 15 MG immediate release tablet Take 15 mg by mouth every 6 (six) hours as needed for pain.   Yes [provider]  PARoxetine (PAXIL) 20 MG tablet Take 20 mg by mouth at bedtime.   Yes [provider]  predniSONE (DELTASONE) 20 MG tablet Take 40 mg by mouth daily with breakfast. 06/03/18 06/08/18 Yes [provider]  pregabalin (LYRICA) 150 MG capsule Take 150 mg by mouth 2 (two) times daily.   Yes [provider]  senna-docusate (SENOKOT-S) 8.6-50 MG tablet Take 2 tablets by mouth at bedtime.   Yes [provider]  tamsulosin (FLOMAX)  0.4 MG CAPS capsule Take 0.8 mg by mouth every evening.  11/26/13  Yes [provider]  tiZANidine (ZANAFLEX) 2 MG tablet Take 2 mg by mouth every 8 (eight) hours as needed for muscle spasms.   Yes [provider]  vitamin B-12 (CYANOCOBALAMIN) 1000 MCG tablet Take 1,000 mcg by mouth daily.   Yes [provider]   Dg Chest 2 View  Result Date: 06/03/2018 CLINICAL DATA:  [place] EXAM: CHEST - 2 VIEW COMPARISON:  11/26/2015 FINDINGS: Generalized interstitial coarsening without Kerley lines or effusion. Heart size is normal. No collapse or consolidation. IMPRESSION: Bronchitic markings without focal pneumonia. Electronically Signed   By: Monte Fantasia M.D.   On: 06/03/2018 14:47   Dg Tibia/fibula Left  Result Date: 06/03/2018 CLINICAL DATA:  Worsening left leg infection x3 days. EXAM: LEFT TIBIA AND FIBULA - 2 VIEW COMPARISON:  None. FINDINGS: Overlapping AP and lateral views of the left tibia and fibula are provided. Soft tissue nodularity is seen along the lateral aspect of the knee and leg with moderate soft tissue swelling noted about the ankle. No acute fracture, bone destruction or suspicious osseous lesions. Osteoarthritic joint space narrowing of the medial femorotibial compartment with spurring of the tibial spine is identified. No joint effusion is seen. IMPRESSION: 1. Soft tissue nodularity along the lateral aspect of the knee and leg with moderate soft tissue swelling about the ankle. 2. 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 3. No acute osseous abnormality. No evidence of bone destruction/osteomyelitis. Electronically Signed   By: Ashley Royalty M.D.   On: 06/03/2018 14:52   Mr Femur Left Wo Contrast  Result Date: 06/04/2018 CLINICAL DATA:  Erythema and edema of the left thigh EXAM: MR OF THE LEFT FEMUR WITHOUT CONTRAST TECHNIQUE: Multiplanar, multisequence MR imaging of the left femur was performed. No intravenous contrast was  administered. COMPARISON:  None. FINDINGS: Bones/Joint/Cartilage No marrow signal abnormality. No fracture or dislocation. Normal alignment. No joint effusion. No periosteal reaction or bone destruction. No aggressive osseous lesion. Ligaments, Muscles and Tendons Muscles are normal. No muscle atrophy. No intramuscular fluid collection or hematoma. Soft tissue No fluid collection or hematoma. No soft tissue mass. Generalized mild soft tissue edema in the subcutaneous fat surrounding the left thigh. IMPRESSION: 1. Generalized mild soft tissue edema in the subcutaneous fat surrounding the left thigh. Differential and considerations include cellulitis versus venous stasis. Electronically Signed   By: Kathreen Devoid   On: 06/04/2018 13:33   Dg Foot Complete Left  Result Date: 06/03/2018 CLINICAL DATA:  Worsening infection. EXAM: LEFT FOOT - COMPLETE 3+ VIEW COMPARISON:  MRI 03/26/2017.  Left foot series 10/02/2016. FINDINGS: Prior amputation left first MTP. Destructive changes noted of the distal aspect of the proximal phalanx and proximal aspect of the middle phalanx of the left second digit. Destructive changes noted of the distal portion of the distal phalanx of the left second digit. These findings are suggestive of osteomyelitis. No other focal bony abnormality identified. IMPRESSION: 1. Destructive changes of the proximal and middle phalanges of the left second digit suggesting osteomyelitis. Septic arthritis at the proximal interphalangeal joint space may be present. 2. Structures changes in the distal tip of the distal phalanx of the left second digit suggesting osteomyelitis. Electronically Signed   By: Marcello Moores  Register   On: 06/03/2018 14:50   - pertinent xrays, CT, MRI studies were reviewed and independently interpreted  Positive ROS: All other systems have been reviewed and were otherwise negative with the exception of those mentioned in the HPI and as above.  Physical  Exam: General: Alert, no  acute distress Psychiatric: Patient is competent for consent with normal mood and affect Lymphatic: No axillary or cervical lymphadenopathy Cardiovascular: No pedal edema Respiratory: No cyanosis, no use of accessory musculature GI: No organomegaly, abdomen is soft and non-tender    Images:  @ENCIMAGES @  Labs:  Lab Results  Component Value Date   HGBA1C 8.0 (H) 06/04/2018   HGBA1C 7.9 (H) 04/25/2018   HGBA1C 7.2 (H) 03/25/2017   ESRSEDRATE 120 (H) 03/25/2017   ESRSEDRATE 84 (H) 10/02/2016   ESRSEDRATE 132 (H) 12/01/2015   CRP 4.9 (H) 03/25/2017   CRP 30.6 (H) 10/02/2016   CRP 13.7 (H) 11/11/2015   REPTSTATUS PENDING 06/03/2018   GRAMSTAIN  11/11/2015    ABUNDANT WBC PRESENT,BOTH PMN AND MONONUCLEAR RARE SQUAMOUS EPITHELIAL CELLS PRESENT FEW GRAM POSITIVE COCCI IN PAIRS RARE GRAM POSITIVE RODS RARE GRAM NEGATIVE COCCOBACILLI    CULT  06/03/2018    NO GROWTH < 24 HOURS Performed at [facility], [place] [address] [place], [place] [address]    LABORGA PROVIDENCIA [name] (A) 01/12/2017    Lab Results  Component Value Date   ALBUMIN 2.2 (L) 06/04/2018   ALBUMIN 2.8 (L) 06/03/2018   ALBUMIN 3.2 (L) 03/28/2017   PREALBUMIN 23.3 03/25/2017    Neurologic: Patient does not have protective sensation bilateral lower extremities.   MUSCULOSKELETAL:   Skin: Examination patient does have cellulitis that extends up to the groin.  There is blistering of the skin from the foot up to the thigh.  There is no crepitation there is no clinical signs of subcutaneous air.  Patient was examined twice today.  This morning and this afternoon.  Clinically his leg does look improved this afternoon.  Patient does have swelling I cannot palpate a pulse his foot is warm.  No clinical signs of arterial insufficiency.  Review of the MRI scan shows cellulitis does not show any subcutaneous air no findings consistent with necrotizing fasciitis.  Assessment: Assessment: Diabetic insensate  neuropathy with severe protein caloric malnutrition with a stable right transtibial amputation with cellulitis of the entire left lower extremity with osteomyelitis of the second toe  Plan: Plan: We will continue to follow the patient and will reevaluate in the morning.  Clinically patient is showing improvement in the cellulitis and will make a determination for surgery for Friday.  Thank you for the consult and the opportunity to see Mr. Olson Lucarelli, [place] [phone] 3:27 PM

## 2018-06-04 NOTE — NC FL2 (Signed)
[place] LEVEL OF CARE SCREENING TOOL     IDENTIFICATION  Patient Name: Gregory [name] Birthdate: Oct 23, 1960 Sex: male Admission Date (Current Location): 06/03/2018  [place] and [place] Number:  Gregory [name] [id number] [name] and Address:  [facility], [place] [address] [place], [place] [address]      Provider Number: [id number]  Attending Physician Name and Address:  Shelly Coss, MD  Relative Name and Phone Number:  [name]; daughter; [phone]    Current Level of Care: Hospital Recommended Level of Care: [place] Prior Approval Number:    Date Approved/Denied:   PASRR Number: [id number]  Discharge Plan: SNF    Current Diagnoses: Patient Active Problem List   Diagnosis Date Noted  . Septic arthritis ([place]) 06/03/2018  . Idiopathic chronic venous hypertension of left lower extremity with ulcer and inflammation ([place]) 11/14/2017  . Great toe amputation status, left 11/14/2017  . Acquired absence of right leg below knee ([place]) 06/20/2017  . Osteomyelitis ([place]) 03/25/2017  . Skin cancer 03/25/2017  . Depression, recurrent ([place]) 11/05/2016  . Diabetic ulcer of toe of left foot ([place]) 10/02/2016  . 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 Chronic pain 09/27/2016  . Dyslipidemia associated with type 2 diabetes mellitus ([place]) 06/06/2016  . GERD without esophagitis 05/11/2016  . Hypertensive heart disease with congestive heart failure ([place]) 12/15/2015  . Spinal stenosis in cervical region 12/15/2015  . Type II diabetes mellitus with neurological manifestations ([place]) 12/15/2015  . Chronic diastolic CHF (congestive heart failure) ([place])   . Pulmonary hypertension ([place])   . Urinary retention   . Foot drop, bilateral   . MRSA bacteremia 10/03/2015  . COPD (chronic obstructive pulmonary disease) ([place]) 10/03/2015  . Acute osteomyelitis of left foot ([place]) 10/03/2015    Orientation RESPIRATION BLADDER Height & Weight      Self, Situation, Place  O2(nasal canula 2L) Continent Weight:   Height:  6' (182.9 cm)  BEHAVIORAL SYMPTOMS/MOOD NEUROLOGICAL BOWEL NUTRITION STATUS      Continent Diet(see discharge summary)  AMBULATORY STATUS COMMUNICATION OF NEEDS Skin   Extensive Assist   PU Stage and Appropriate Care, Other (Comment)   PU Stage 2 Dressing: (PRN foam dressing change on sacrum)                   Personal Care Assistance Level of Assistance  Bathing, Feeding, Dressing Bathing Assistance: Limited assistance Feeding assistance: Independent Dressing Assistance: Limited assistance     Functional Limitations Info  Sight, Hearing, Speech Sight Info: Adequate Hearing Info: Adequate Speech Info: Adequate    SPECIAL CARE FACTORS FREQUENCY                       Contractures Contractures Info: Not present    Additional Factors Info  Code Status, Allergies, Psychotropic, Isolation Precautions, Insulin Sliding Scale Code Status Info: Full Code Allergies Info: Latex Psychotropic Info: buPROPion (WELLBUTRIN XL) 24 hr tablet 300 mg daily at bedtime PO; PARoxetine (PAXIL) tablet 20 mg daily at bedtime PO; pregabalin (LYRICA) capsule 150 mg 2x daily PO Insulin Sliding Scale Info: buPROPion (WELLBUTRIN XL) 24 hr tablet 300 mg 3x daily with meals Isolation Precautions Info: contact precautions for MRSA     Current Medications (06/04/2018):  This is the current hospital active medication list Current Facility-Administered Medications  Medication Dose Route Frequency Provider Last Rate Last Dose  . 0.9 %  sodium chloride infusion   Intravenous Continuous Bonnielee Haff, MD 75 mL/hr at 06/03/18  2202    . acetaminophen (TYLENOL) tablet 650 mg  650 mg Oral Q6H PRN Bonnielee Haff, MD       Or  . acetaminophen (TYLENOL) suppository 650 mg  650 mg Rectal Q6H PRN Bonnielee Haff, MD      . albuterol (PROVENTIL) (2.5 MG/3ML) 0.083% nebulizer solution 2.5 mg  2.5 mg Nebulization Q2H PRN Bonnielee Haff, MD      . atorvastatin (LIPITOR) tablet 20 mg  20 mg Oral QHS Bonnielee Haff, MD   20 mg at 06/03/18 2151  . buPROPion (WELLBUTRIN XL) 24 hr tablet 300 mg  300 mg Oral QHS Bonnielee Haff, MD   300 mg at 06/03/18 2152  . ceFEPIme (MAXIPIME) 1 g in sodium chloride 0.9 % 100 mL IVPB  1 g Intravenous Q24H Golden Circle, FNP      . Chlorhexidine Gluconate Cloth 2 % PADS 6 each  6 each Topical Q0600 Bonnielee Haff, MD   6 each at 06/04/18 0518  . dextrose 50 % solution           . finasteride (PROSCAR) tablet 5 mg  5 mg Oral Daily Bonnielee Haff, MD   5 mg at 06/04/18 0900  . guaiFENesin (MUCINEX) 12 hr tablet 600 mg  600 mg Oral Q12H PRN Bonnielee Haff, MD   600 mg at 06/03/18 2202  . insulin aspart (novoLOG) injection 0-15 Units  0-15 Units Subcutaneous TID WC Bonnielee Haff, MD      . ipratropium-albuterol (DUONEB) 0.5-2.5 (3) MG/3ML nebulizer solution 3 mL  3 mL Nebulization Once Bonnielee Haff, MD      . ipratropium-albuterol (DUONEB) 0.5-2.5 (3) MG/3ML nebulizer solution 3 mL  3 mL Nebulization Q6H Bonnielee Haff, MD      . loratadine (CLARITIN) tablet 10 mg  10 mg Oral Daily Bonnielee Haff, MD   10 mg at 06/04/18 0901  . menthol-cetylpyridinium (CEPACOL) lozenge 3 mg  1 lozenge Oral PRN Bonnielee Haff, MD   3 mg at 06/04/18 8119  . metoprolol tartrate (LOPRESSOR) tablet 25 mg  25 mg Oral Daily Bonnielee Haff, MD   25 mg at 06/04/18 0901  . metroNIDAZOLE (FLAGYL) tablet 500 mg  500 mg Oral Q8H Golden Circle, FNP      . morphine (MS CONTIN) 12 hr tablet 15 mg  15 mg Oral Q12H Bonnielee Haff, MD   15 mg at 06/03/18 2151  . mupirocin ointment (BACTROBAN) 2 % 1 application  1 application Nasal BID Bonnielee Haff, MD   1 application at 14/78/29 0516  . ondansetron (ZOFRAN) tablet 4 mg  4 mg Oral Q6H PRN Bonnielee Haff, MD       Or  . ondansetron [facility]) injection 4 mg  4 mg Intravenous Q6H PRN Bonnielee Haff, MD      . oxyCODONE (Oxy IR/ROXICODONE) immediate release  tablet 15 mg  15 mg Oral Q6H PRN Bonnielee Haff, MD   15 mg at 06/04/18 0858  . PARoxetine (PAXIL) tablet 20 mg  20 mg Oral QHS Bonnielee Haff, MD   20 mg at 06/03/18 2152  . predniSONE (DELTASONE) tablet 10 mg  10 mg Oral Q breakfast Bonnielee Haff, MD   10 mg at 06/04/18 0900  . pregabalin (LYRICA) capsule 150 mg  150 mg Oral BID Schorr, Rhetta Mura, NP   150 mg at 06/04/18 0857  . senna-docusate (Senokot-S) tablet 2 tablet  2 tablet Oral QHS Bonnielee Haff, MD   2 tablet at 06/03/18 2151  . tamsulosin (FLOMAX) capsule  0.8 mg  0.8 mg Oral QPC supper Bonnielee Haff, MD   0.8 mg at 06/03/18 2151  . tiZANidine (ZANAFLEX) tablet 2 mg  2 mg Oral Q8H PRN Bonnielee Haff, MD      . Derrill Memo ON 06/05/2018] vancomycin (VANCOCIN) 1,250 mg in sodium chloride 0.9 % 250 mL IVPB  1,250 mg Intravenous Q36H Angela Adam, RPH      . vitamin B-12 (CYANOCOBALAMIN) tablet 1,000 mcg  1,000 mcg Oral Daily Bonnielee Haff, MD   1,000 mcg at 06/04/18 0900  . zolpidem (AMBIEN) tablet 5 mg  5 mg Oral QHS PRN Schorr, Rhetta Mura, NP         Discharge Medications: Please see discharge summary for a list of discharge medications.  Relevant Imaging Results:  Relevant Lab Results:   Additional Information SS#:[id number] [place], [place]

## 2018-06-04 NOTE — Social Work (Signed)
CSW acknowledging pt is LTC resident at [facility]. CSW continuing to follow for support with disposition when medically appropriate.  Westley Hummer, MSW, [place] Work [phone]

## 2018-06-04 NOTE — Consult Note (Addendum)
[facility]    Date of Admission:  06/03/2018   Total days of antibiotics: 2        Keflex 12/16-12/17        Azithromycin 12/16-12/17              Vancomycin 12/17-         Zosyn 12/17-               Reason for Consult: L foot osteomyelitis and LLE cellulitis concerning for necrotizing fasciitis    Referring Provider: Dr. Tawanna Solo Primary Care Provider: [name], MD  Assessment: Sepsis secondary to osteomyelitis and septic arthritis of the left foot - Patient is afebrile. Leukocytosis to 15. Blood cultures pending. Blood cultures were drawn after one day of antibiotic therapy with Azithromycin and Keflex. - Erythema and edema extends from the left foot to the left upper thigh and scrotum with both ruptured and intact bullae. - Exam and time course of this infection is not consistent with necrotizing fasciitis. LRINEC score cannot be calculated with CRP. - Primary team plans on MRI imaging of the leg today. Orthopedic surgery plans for surgery today. - History of CKD with baseline Cr of about 2. Current AKI with Cr elevated to 3.11. AKI likely due to hypovolemia in the setting of sepsis. Will continue with Vancomycin for a short time period to preserve kidney function. - If cultures are taken during surgery, they may not grow any bacteria because he received antibiotics prior to surgery.  Plan: 1. Narrow antibiotics from Vanc/Zosyn to Vancomycin, Cefepime, and Flagyl. This regimen will cover for Pseudomonas as well as anaerobes. 2. Follow-up blood cultures 3. The operative intervention will help guide length of treatment.   Principal Problem:   Acute osteomyelitis of left foot (HCC) Active Problems:   COPD (chronic obstructive pulmonary disease) (HCC)   Chronic diastolic CHF (congestive heart failure) (HCC)   Type II diabetes mellitus with neurological manifestations (HCC)   Chronic pain   Depression, recurrent (HCC)   Septic  arthritis ([place])   Scheduled Meds: . atorvastatin  20 mg Oral QHS  . buPROPion  300 mg Oral QHS  . Chlorhexidine Gluconate Cloth  6 each Topical Q0600  . dextrose      . finasteride  5 mg Oral Daily  . insulin aspart  0-15 Units Subcutaneous TID WC  . ipratropium-albuterol  3 mL Nebulization Once  . ipratropium-albuterol  3 mL Nebulization Q6H  . loratadine  10 mg Oral Daily  . metoprolol tartrate  25 mg Oral Daily  . morphine  15 mg Oral Q12H  . mupirocin ointment  1 application Nasal BID  . PARoxetine  20 mg Oral QHS  . predniSONE  10 mg Oral Q breakfast  . pregabalin  150 mg Oral BID  . senna-docusate  2 tablet Oral QHS  . tamsulosin  0.8 mg Oral QPC supper  . vitamin B-12  1,000 mcg Oral Daily   Continuous Infusions: . sodium chloride 75 mL/hr at 06/03/18 2202  . piperacillin-tazobactam (ZOSYN)  IV 3.375 g (06/04/18 0518)  . [START ON 06/05/2018] vancomycin     PRN Meds:.acetaminophen **OR** acetaminophen, albuterol, guaiFENesin, menthol-cetylpyridinium, ondansetron **OR** ondansetron (ZOFRAN) IV, oxyCODONE, tiZANidine, zolpidem  HPI: Gregory [name] is a 57 y.o. 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 He began to develop pain in his left leg and foot four days ago, which was later followed by redness and swelling of the leg. He was started on Keflex and Azithromycin at his nursing facility for infection of the lower extremity. When his symptoms failed to improve, he was sent to the emergency department, where he was started on Vancomycin and Zosyn. Xray of the left foot showed destructive changes suggestive of osteomyelitis to the proximal and middle phalanges of  the second digit and to the distal tip of the distal phalanx of the second digit as well as possible septic arthritis at the PIP of the second digit. Left tibia/fibula xray showed soft tissue swelling and OA, but no evidence of osteomyelitis. Blood cultures drawn 12/17 show no growth to date. Since admission, patient has been afebrile with a Tmax of 100.2 and has had leukocytosis with WBCs of 15.1 today. He is scheduled for surgery with [name] today. The primary team has concerns for necrotizing fasciitis based on the appearance of the limb and has consulted ID for input. Today he reports that overall his leg feels improved with less pain and less swelling in the thigh. However, he continues to have sharp pains that radiate up his leg from his foot to his scrotum.   Review of Systems: Review of Systems  Constitutional: Positive for chills. Negative for fever.  Respiratory: Negative for shortness of breath.   Cardiovascular: Positive for leg swelling. Negative for chest pain.  Gastrointestinal: Negative for abdominal pain.  Genitourinary: Negative for dysuria.  Skin: Positive for rash.       Left foot and leg pain    Past Medical History:  Diagnosis Date  . Acquired absence of left great toe ([place])   . Acquired absence of right leg below knee ([place])   . Acute osteomyelitis of calcaneum, right ([place]) 10/02/2016  . Acute respiratory failure ([place])   . Anemia   . Basal cell carcinoma, eyelid   . Cancer ([place])   . CHF (congestive heart failure) (HCC)    chronic diastolic   . Chronic back pain   . Chronic kidney disease    stage 3   . Chronic multifocal osteomyelitis (HCC)    of ankle and foot   . Chronic pain syndrome   . COPD (chronic obstructive pulmonary disease) ([place])   . DDD (degenerative disc disease), lumbar   . Depression    major depressive disorder   . Diabetes mellitus without complication ([place])    type 2   . Diabetic ulcer of toe of left foot ([place]) 10/02/2016  . Difficulty in  walking, not elsewhere classified   . 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 Foot drop, bilateral   . Foot drop, right 12/27/2015  . GERD (gastroesophageal reflux disease)   . Herpesviral vesicular dermatitis   . Hyperlipidemia   . Hyperlipidemia   . Hypertension   . Insomnia   . Malignant melanoma of other parts of face ([place])   . MRSA bacteremia   . Muscle weakness (generalized)   . Nasal congestion   . Neuromuscular dysfunction of bladder   . Osteomyelitis ([place])   . Osteomyelitis ([place])   . Other idiopathic peripheral autonomic neuropathy   . Retention of urine, unspecified   . Squamous cell carcinoma of skin   . Unsteadiness on  feet   . Urinary retention   . Urinary retention   . Urinary tract infection     Social History   Tobacco Use  . Smoking status: Former Smoker    Packs/day: 3.00    Types: Cigarettes  . Smokeless tobacco: Never Used  Substance Use Topics  . Alcohol use: No  . Drug use: No    Family History  Problem Relation Age of Onset  . Cancer Other   . Diabetes Other    Allergies  Allergen Reactions  . Latex Other (See Comments)    Unknown - per MAR    OBJECTIVE: Blood pressure 123/69, pulse 91, temperature 98.4 F (36.9 C), temperature source Oral, resp. rate 18, height 6' (1.829 m), SpO2 99 %.  Physical Exam Constitutional:      General: He is not in acute distress.    Appearance: Normal appearance.  HENT:     Head: Normocephalic and atraumatic.  Eyes:     Extraocular Movements: Extraocular movements intact.     Pupils: Pupils are equal, round, and reactive to light.  Cardiovascular:     Rate and Rhythm: Normal rate and regular rhythm.     Heart sounds: No murmur.  Pulmonary:     Effort: Pulmonary effort is normal.     Breath sounds: Normal breath sounds. No wheezing, rhonchi or rales.  Abdominal:     General: Bowel sounds are normal. There is no distension.     Palpations: Abdomen is soft.     Tenderness: There is no abdominal tenderness.    Genitourinary:    Comments: Erythematous scrotum without edema or bullae Musculoskeletal:     Comments: Right BKA without erythema or edema of the right thigh. The left lower extremity from the foot to the upper thigh is erythematous and edematous with increased warmth and tenderness to palpation. There are both intact and ruptured bullae primarily on the lateral leg. Amputated left great toe. Left second digit with distal toe eschar. The underlying soft tissue structures and bone are not visualized.   Skin:    Comments: See MSK exam.  Neurological:     Mental Status: He is alert.     Lab Results Lab Results  Component Value Date   WBC 15.1 (H) 06/04/2018   HGB 7.8 (L) 06/04/2018   HCT 25.7 (L) 06/04/2018   MCV 89.2 06/04/2018   PLT 172 06/04/2018    Lab Results  Component Value Date   CREATININE 3.11 (H) 06/04/2018   BUN 61 (H) 06/04/2018   NA 138 06/04/2018   K 4.4 06/04/2018   CL 104 06/04/2018   CO2 20 (L) 06/04/2018    Lab Results  Component Value Date   ALT 13 06/04/2018   AST 14 (L) 06/04/2018   ALKPHOS 107 06/04/2018   BILITOT 0.9 06/04/2018     Microbiology: Recent Results (from the past 240 hour(s))  Blood culture (routine x 2)     Status: None (Preliminary result)   Collection Time: 06/03/18  1:32 PM  Result Value Ref Range Status   Specimen Description   Final    BLOOD RIGHT ANTECUBITAL Performed at [facility], [place] [address], [place], [place] [address]    Special Requests   Final    BOTTLES DRAWN AEROBIC AND ANAEROBIC Blood Culture results may not be optimal due to an excessive volume of blood received in culture bottles Performed at [facility] [address] [place], [place] [address]  Culture   Final    NO GROWTH < 24 HOURS Performed at [facility], [place] [address], [place], [place] [address]    Report Status PENDING  Incomplete  Blood culture (routine x 2)     Status: None (Preliminary result)    Collection Time: 06/03/18  1:45 PM  Result Value Ref Range Status   Specimen Description   Final    BLOOD LEFT WRIST Performed at [facility] [address], [place], [place] [address]    Special Requests   Final    BOTTLES DRAWN AEROBIC AND ANAEROBIC Blood Culture adequate volume Performed at [facility] [address] [place], [place] [address]    Culture   Final    NO GROWTH < 24 HOURS Performed at [facility] [address] [place], [place] [address]    Report Status PENDING  Incomplete  Surgical pcr screen     Status: Abnormal   Collection Time: 06/03/18 10:51 PM  Result Value Ref Range Status   MRSA, PCR POSITIVE (A) NEGATIVE Final    Comment: RESULT CALLED TO, READ BACK BY AND VERIFIED WITHBeulah Gandy RN [phone] 06/04/18 A BROWNING    Staphylococcus aureus POSITIVE (A) NEGATIVE Final    Comment: (NOTE) The Xpert SA Assay (FDA approved for NASAL specimens in patients 104 years of age and older), is one component of a comprehensive surveillance program. It is not intended to diagnose infection nor to guide or monitor treatment. Performed at [facility], [place] [address], [name], [place] [address]     Corinne Ports, [place] for [facility] [phone] pager   [phone] cell 06/04/2018, 10:02 AM

## 2018-06-04 NOTE — Progress Notes (Signed)
CBG 64.  Pt is NPO. D50  12.5mg  given.  MD notified.  Will recheck CBG and continue to monitor.

## 2018-06-05 ENCOUNTER — Other Ambulatory Visit (INDEPENDENT_AMBULATORY_CARE_PROVIDER_SITE_OTHER): Payer: Self-pay | Admitting: Orthopedic Surgery

## 2018-06-05 DIAGNOSIS — Z960 Presence of urogenital implants: Secondary | ICD-10-CM

## 2018-06-05 DIAGNOSIS — M86272 Subacute osteomyelitis, left ankle and foot: Secondary | ICD-10-CM

## 2018-06-05 LAB — GLUCOSE, CAPILLARY
Glucose-Capillary: 52 mg/dL — ABNORMAL LOW (ref 70–99)
Glucose-Capillary: 70 mg/dL (ref 70–99)
Glucose-Capillary: 109 mg/dL — ABNORMAL HIGH (ref 70–99)
Glucose-Capillary: 259 mg/dL — ABNORMAL HIGH (ref 70–99)
Glucose-Capillary: 181 mg/dL — ABNORMAL HIGH (ref 70–99)

## 2018-06-05 LAB — CBC WITH DIFFERENTIAL/PLATELET
BLASTS: 0 %
Band Neutrophils: 0 %
Basophils Absolute: 0 10*3/uL (ref 0.0–0.1)
Basophils Relative: 0 %
Eosinophils Absolute: 0.1 10*3/uL (ref 0.0–0.5)
Eosinophils Relative: 1 %
HCT: 27.1 % — ABNORMAL LOW (ref 39.0–52.0)
Hemoglobin: 8.3 g/dL — ABNORMAL LOW (ref 13.0–17.0)
LYMPHS PCT: 4 %
Lymphs Abs: 0.6 10*3/uL — ABNORMAL LOW (ref 0.7–4.0)
MCH: 27.8 pg (ref 26.0–34.0)
MCHC: 30.6 g/dL (ref 30.0–36.0)
MCV: 90.6 fL (ref 80.0–100.0)
Metamyelocytes Relative: 0 %
Monocytes Absolute: 1 10*3/uL (ref 0.1–1.0)
Monocytes Relative: 7 %
Myelocytes: 0 %
NRBC: 0 % (ref 0.0–0.2)
Neutro Abs: 12.5 10*3/uL — ABNORMAL HIGH (ref 1.7–7.7)
Neutrophils Relative %: 88 %
Other: 0 %
Platelets: 210 10*3/uL (ref 150–400)
Promyelocytes Relative: 0 %
RBC: 2.99 MIL/uL — ABNORMAL LOW (ref 4.22–5.81)
RDW: 16.2 % — ABNORMAL HIGH (ref 11.5–15.5)
WBC: 14.2 10*3/uL — ABNORMAL HIGH (ref 4.0–10.5)
nRBC: 0 /100 WBC

## 2018-06-05 LAB — BASIC METABOLIC PANEL
ANION GAP: 13 (ref 5–15)
BUN: 56 mg/dL — ABNORMAL HIGH (ref 6–20)
CHLORIDE: 108 mmol/L (ref 98–111)
CO2: 20 mmol/L — ABNORMAL LOW (ref 22–32)
Calcium: 8.4 mg/dL — ABNORMAL LOW (ref 8.9–10.3)
Creatinine, Ser: 2.57 mg/dL — ABNORMAL HIGH (ref 0.61–1.24)
GFR calc Af Amer: 30 mL/min — ABNORMAL LOW (ref >60)
GFR calc non Af Amer: 26 mL/min — ABNORMAL LOW (ref >60)
Glucose, Bld: 76 mg/dL (ref 70–99)
Potassium: 4.2 mmol/L (ref 3.5–5.1)
Sodium: 141 mmol/L (ref 135–145)

## 2018-06-05 MED ORDER — LIDOCAINE VISCOUS HCL 2 % MT SOLN
15.0000 mL | OROMUCOSAL | Status: DC | PRN
  Filled 2018-06-05: qty 15

## 2018-06-05 MED ORDER — SODIUM CHLORIDE 0.9 % IV SOLN
2.0000 g | INTRAVENOUS | Status: DC
  Administered 2018-06-05: 2 g via INTRAVENOUS
  Filled 2018-06-05 (×2): qty 2

## 2018-06-05 MED ORDER — SODIUM CHLORIDE 0.9 % IV SOLN
INTRAVENOUS | Status: DC
  Administered 2018-06-05 – 2018-06-07 (×5): via INTRAVENOUS

## 2018-06-05 MED ORDER — NICOTINE 14 MG/24HR TD PT24
14.0000 mg | MEDICATED_PATCH | Freq: Every day | TRANSDERMAL | Status: DC
  Administered 2018-06-05 – 2018-06-09 (×5): 14 mg via TRANSDERMAL
  Filled 2018-06-05 (×5): qty 1

## 2018-06-05 MED ORDER — VANCOMYCIN HCL 10 G IV SOLR: 1750.0000 mg | INTRAVENOUS | Status: DC

## 2018-06-05 MED ORDER — VANCOMYCIN HCL 500 MG IV SOLR
500.0000 mg | Freq: Once | INTRAVENOUS | Status: AC
  Administered 2018-06-05: 500 mg via INTRAVENOUS
  Filled 2018-06-05: qty 500

## 2018-06-05 MED ORDER — PREDNISONE 5 MG PO TABS
5.0000 mg | ORAL_TABLET | Freq: Every day | ORAL | Status: AC
  Administered 2018-06-06 – 2018-06-07 (×2): 5 mg via ORAL
  Filled 2018-06-05 (×2): qty 1

## 2018-06-05 MED ORDER — CHLORHEXIDINE GLUCONATE 4 % EX LIQD
60.0000 mL | Freq: Once | CUTANEOUS | Status: AC
  Administered 2018-06-06: 4 via TOPICAL
  Filled 2018-06-05: qty 60

## 2018-06-05 NOTE — Progress Notes (Signed)
Pt refused to get MRI.

## 2018-06-05 NOTE — Progress Notes (Signed)
[facility] for Infectious Disease  Date of Admission:  06/03/2018         Total days of antibiotics 3              Keflex 12/16-12/17                                                                                Azithromycin 12/16-12/17                                                                                Zosyn 12/17                                                                                Vancomycin 12/17-present                Cefepime 12/18-present              Metronidazole 12/18-present                                                                                       ASSESSMENT: Sepsis secondary to osteomyelitis and septic arthritis of the left foot - Patient continues to be afebrile and normotensive. Leukocytosis has decreased from 15.1 to 14.2. Blood cultures with no growth to date. - Clinically, his leg appears similar to yesterday, but he endorses worsening pain, swelling, and warmth of the extremity. - Patient was only able to complete MRI of the femur yesterday due to anxiety. MRI showed soft tissue edema without signs of necrotizing fasciitis. MRI of the left tib/fib and foot have not been completed. - Ortho plans on second digit amputation tomorrow. - Creatinine has improved from 3.11 to 2.57. Okay to continue vancomycin for now.  PLAN: 1. Continue vancomycin, cefepime, and metronidazole 2. Left tib/fib and foot MRI  3. OR tomorrow for second digit amputation with [name]  Principal Problem:   Acute osteomyelitis of left foot ([place]) Active Problems:   COPD (chronic obstructive pulmonary disease) ([place])   Sepsis with acute renal failure without septic shock (HCC)   Chronic diastolic CHF (congestive heart failure) (HCC)   Type II diabetes mellitus with neurological manifestations (HCC)   Chronic pain  Depression, recurrent ([place])   Septic arthritis ([place])   Severe protein-calorie malnutrition ([place])   Cellulitis of left  leg   Scheduled Meds: . atorvastatin  20 mg Oral QHS  . buPROPion  300 mg Oral QHS  . Chlorhexidine Gluconate Cloth  6 each Topical Q0600  . finasteride  5 mg Oral Daily  . insulin aspart  0-15 Units Subcutaneous TID WC  . ipratropium-albuterol  3 mL Nebulization Once  . ipratropium-albuterol  3 mL Nebulization TID  . loratadine  10 mg Oral Daily  . metoprolol tartrate  25 mg Oral Daily  . metroNIDAZOLE  500 mg Oral Q8H  . morphine  15 mg Oral Q12H  . mupirocin ointment  1 application Nasal BID  . PARoxetine  20 mg Oral QHS  . [START ON 06/06/2018] predniSONE  5 mg Oral Q breakfast  . pregabalin  150 mg Oral BID  . senna-docusate  2 tablet Oral QHS  . tamsulosin  0.8 mg Oral QPC supper  . vitamin B-12  1,000 mcg Oral Daily   Continuous Infusions: . ceFEPime (MAXIPIME) IV Stopped (06/04/18 1447)  . vancomycin 1,250 mg (06/05/18 0237)   PRN Meds:.acetaminophen **OR** acetaminophen, albuterol, guaiFENesin, LORazepam, menthol-cetylpyridinium, ondansetron **OR** ondansetron (ZOFRAN) IV, oxyCODONE, tiZANidine, zolpidem   SUBJECTIVE: Gregory [name] reports that his left leg feels more painful, more swollen, and hotter today than it did yesterday. He denies pain anywhere else. He denies chills. He was sleepy, but arousable this morning. His daughter, who is a Marine scientist, was at bedside. She reports that his leg looks about the same and has not developed any new blisters or spread farther up his leg.   Review of Systems: Review of Systems  Constitutional: Negative for chills and fever.  Respiratory: Negative for cough.   Cardiovascular: Negative for chest pain.  Gastrointestinal: Negative for abdominal pain.  Genitourinary: Negative for dysuria.  Skin: Positive for rash.    Allergies  Allergen Reactions  . Latex Other (See Comments)    Unknown - per MAR    OBJECTIVE: Vitals:   06/04/18 1446 06/04/18 2007 06/04/18 2114 06/05/18 0528  BP: 116/62  133/68 (!) 162/80  Pulse: 72  88 97   Resp: 19  18 (!) 21  Temp: 98 F (36.7 C)  97.8 F (36.6 C) 98.9 F (37.2 C)  TempSrc: Oral  Oral Oral  SpO2: 94% 95% 100% 96%  Weight:      Height:       Body mass index is 34.53 kg/m.  Physical Exam Gen: Sleeping comfortably in bed. Easily arousable, but patient dozes back off quickly. No distress. CV: RRR. No murmurs, rubs, or gallops.  Pulm: CTAB. No wheezes, rales, or rhonchi. Abd: Obese. Soft, non-distended, non-tender. GU: Erythematous scrotum without edema or bullae. Condom catheter is in place. MSK: Overall, his lower extremities appear similar to yesterday. Right BKA without erythema or edema of the right thigh. The left lower extremity from the foot to the upper thigh is erythematous and edematous with increased warmth and tenderness to palpation. There are both intact and ruptured bullae primarily on the lateral leg and medial foot. There is a large bullae with a purplish appearance inferior to the medial malleolus of the left foot. Amputated left great toe. Left second digit with distal toe eschar.   Lab Results Lab Results  Component Value Date   WBC 14.2 (H) 06/05/2018   HGB 8.3 (L) 06/05/2018   HCT 27.1 (L) 06/05/2018   MCV 90.6 06/05/2018  PLT 210 06/05/2018    Lab Results  Component Value Date   CREATININE 2.57 (H) 06/05/2018   BUN 56 (H) 06/05/2018   NA 141 06/05/2018   K 4.2 06/05/2018   CL 108 06/05/2018   CO2 20 (L) 06/05/2018    Lab Results  Component Value Date   ALT 13 06/04/2018   AST 14 (L) 06/04/2018   ALKPHOS 107 06/04/2018   BILITOT 0.9 06/04/2018     Microbiology: Recent Results (from the past 240 hour(s))  Blood culture (routine x 2)     Status: None (Preliminary result)   Collection Time: 06/03/18  1:32 PM  Result Value Ref Range Status   Specimen Description   Final    BLOOD RIGHT ANTECUBITAL Performed at [facility], [place] [address], [place], [place] [address]    Special Requests   Final    BOTTLES DRAWN  AEROBIC AND ANAEROBIC Blood Culture results may not be optimal due to an excessive volume of blood received in culture bottles Performed at [facility] [address] [place], [place] [address]    Culture   Final    NO GROWTH 2 DAYS Performed at [facility] [address], [place], [place] [address]    Report Status PENDING  Incomplete  Blood culture (routine x 2)     Status: None (Preliminary result)   Collection Time: 06/03/18  1:45 PM  Result Value Ref Range Status   Specimen Description   Final    BLOOD LEFT WRIST Performed at [facility] [address] [place], [place] [address]    Special Requests   Final    BOTTLES DRAWN AEROBIC AND ANAEROBIC Blood Culture adequate volume Performed at [facility] [address], [place], [place] [address]    Culture   Final    NO GROWTH 2 DAYS Performed at [facility] [address] [place], [place] [address]    Report Status PENDING  Incomplete  Surgical pcr screen     Status: Abnormal   Collection Time: 06/03/18 10:51 PM  Result Value Ref Range Status   MRSA, PCR POSITIVE (A) NEGATIVE Final    Comment: RESULT CALLED TO, READ BACK BY AND VERIFIED WITHBeulah Gandy RN [phone] 06/04/18 A BROWNING    Staphylococcus aureus POSITIVE (A) NEGATIVE Final    Comment: (NOTE) The Xpert SA Assay (FDA approved for NASAL specimens in patients 45 years of age and older), is one component of a comprehensive surveillance program. It is not intended to diagnose infection nor to guide or monitor treatment. Performed at [facility], [place] [address], [place], [place] [address]     Deborah N Dorrell, MD Internal Medicine, PGY1 06/05/2018, 11:15 AM

## 2018-06-05 NOTE — Progress Notes (Signed)
PROGRESS NOTE    Gregory [name]  MVH:[id number] DOB: 01-08-1961 DOA: 06/03/2018 PCP: [name], MD   Brief Narrative: Patient is a 57 year old male with past medical history of diabetes mellitus type 2, essential hypertension, chronic diastolic CHF, ureteral stricture status post recent cystoscopy and urethral dilation, chronic kidney disease stage III, right sided BKA who presented to the emergency department with complaints of pain, swelling, development of blebs/bulla on his left lower extremity.  He was on oral antibiotics as an outpatient but his symptoms got worse and he also started having fever.  Imagings done in the emergency department suggestive of osteomyelitis in the left foot.  ID and orthopedics consulted.Plan for left  2nd digit by orthopedics tomorrow  Assessment & Plan:   Principal Problem:   Acute osteomyelitis of left foot (HCC) Active Problems:   COPD (chronic obstructive pulmonary disease) ([place])   Sepsis with acute renal failure without septic shock (HCC)   Chronic diastolic CHF (congestive heart failure) (HCC)   Type II diabetes mellitus with neurological manifestations (HCC)   Chronic pain   Depression, recurrent (HCC)   Septic arthritis ([place])   Severe protein-calorie malnutrition (HCC)   Cellulitis of left leg  Acute osteomyelitis/septic arthritis of the left foot/possible necrotizing fasciitis: X-ray showed: destructive changes of the proximal and middle phalanges of the left second digit suggesting osteomyelitis. Structural changes in the distal tip of the distal phalanx of the left second digit suggesting osteomyelitis Patient's left lower extremity is significantly swollen, tender and has bullae.  There was suspicion  for necrotizing fasciitis but MRI just showed soft tissue edema/cellulitis.  Left lower extremity swelling and erythema has improved today.  Going for further MRI studies Continue vancomycin , cefepime and Flagyl for now.  [name]  planning for surgery tomorrow(amputation  of left second toe) Follow-up blood cultures.  Has leukocytosis, will follow up trend.  He is afebrile now.  Acute kidney injury on chronic kidney disease stage III: Looked dehydrated on presentation.  His baseline creatinine runs around 1.5-2.  Hold nephrotoxic agents.  Diuretics on hold.  Continue gentle hydration.  Creatinine improved.  Diabetes mellitus type 2: On insulin at home.  Hemoglobin A1c of 8.  Continue sliding his insulin here.  Normocytic anemia: Hemoglobin dropped most likely secondary to hemodilution.  Anemia secondary to chronic disease.  No evidence of bleeding.  We will continue to monitor CBC.  Chronic diastolic CHF: Diuretics on hold.  Patient appeared hypovolemic on presentation.  Continue gentle hydration.  Steroid use: He was started on prednisone about a month ago for unclear reason.  He was on 40 mg daily which has been cut down ,we will continue to taper and stop it.  History of ureteral stricture: Recently underwent dilation of the ureteral stricture by Dr. Karsten Ro.  Has been urinating fine.  Continue Flomax  Chronic pain syndrome: Continue current pain regimen.  Tobacco abuse: Continue nicotine patch         DVT prophylaxis:None for now for the anticipation of surgery Code Status:Full  Family Communication: Discussed with family at the bedside Disposition Plan: Undetermined at this point.  Needs to have surgery.Likely rehab   Consultants: Orthopedics, ID  Procedures: None  Antimicrobials: Vancomycin day 3 Cefepime, Flagyl day 2  Subjective: Patient seen and examined the bedside today in the morning.  He is hemodynamically stable at present.  Lower extremity swelling, erythema is better.  Pain is better today.  Complains of discomfort in the mouth  Objective: Vitals:  06/04/18 1446 06/04/18 2007 06/04/18 2114 06/05/18 0528  BP: 116/62  133/68 (!) 162/80  Pulse: 72  88 97  Resp: 19  18 (!) 21  Temp:  98 F (36.7 C)  97.8 F (36.6 C) 98.9 F (37.2 C)  TempSrc: Oral  Oral Oral  SpO2: 94% 95% 100% 96%  Weight:      Height:        Intake/Output Summary (Last 24 hours) at 06/05/2018 1156 Last data filed at 06/05/2018 0534 Gross per 24 hour  Intake 599.36 ml  Output 450 ml  Net 149.36 ml   Filed Weights   06/04/18 1247  Weight: 115.5 kg    Examination:  General exam: Not in distress,obese HEENT:PERRL,Oral mucosa moist, Ear/Nose normal on gross exam Respiratory system: Bilateral equal air entry, normal vesicular breath sounds, no wheezes or crackles  Cardiovascular system: S1 & S2 heard, RRR. No JVD, murmurs, rubs, gallops or clicks.  Gastrointestinal system: Abdomen is mildly distended, soft and nontender. No organomegaly or masses felt. Normal bowel sounds heard. Central nervous system: Alert and oriented. No focal neurological deficits. Extremities: Edema, erythema, tenderness of the left lower extremity with blebs/bullae .  Left second toe dark in color.  Right-sided BKA Psychiatry: Judgement and insight appear normal. Mood & affect appropriate.     Data Reviewed: I have personally reviewed following labs and imaging studies  CBC: Recent Labs  Lab 06/03/18 1333 06/03/18 1341 06/04/18 0200 06/05/18 0243  WBC 14.9*  --  15.1* 14.2*  NEUTROABS 13.0*  --   --  12.5*  HGB 8.7* 9.2* 7.8* 8.3*  HCT 28.6* 27.0* 25.7* 27.1*  MCV 90.8  --  89.2 90.6  PLT 178  --  172 196   Basic Metabolic Panel: Recent Labs  Lab 06/03/18 1333 06/03/18 1341 06/04/18 0200 06/05/18 0243  NA 133* 136 138 141  K 4.8 4.8 4.4 4.2  CL 100 104 104 108  CO2 20*  --  20* 20*  GLUCOSE 170* 174* 128* 76  BUN 66* 60* 61* 56*  CREATININE 3.24* 3.30* 3.11* 2.57*  CALCIUM 8.4*  --  8.5* 8.4*   GFR: Estimated Creatinine Clearance: 40.6 mL/min (A) (by C-G formula based on SCr of 2.57 mg/dL (H)). Liver Function Tests: Recent Labs  Lab 06/03/18 1333 06/04/18 0200  AST 15 14*  ALT 13 13    ALKPHOS 114 107  BILITOT 0.9 0.9  PROT 6.6 6.5  ALBUMIN 2.8* 2.2*   No results for input(s): LIPASE, AMYLASE in the last 168 hours. No results for input(s): AMMONIA in the last 168 hours. Coagulation Profile: Recent Labs  Lab 06/04/18 0200  INR 1.18   Cardiac Enzymes: No results for input(s): CKTOTAL, CKMB, CKMBINDEX, TROPONINI in the last 168 hours. BNP (last 3 results) No results for input(s): PROBNP in the last 8760 hours. HbA1C: Recent Labs    06/04/18 0200  HGBA1C 8.0*   CBG: Recent Labs  Lab 06/04/18 1229 06/04/18 1730 06/04/18 2119 06/05/18 0815 06/05/18 0849  GLUCAP 120* 102* 123* 52* 70   Lipid Profile: No results for input(s): CHOL, HDL, LDLCALC, TRIG, CHOLHDL, LDLDIRECT in the last 72 hours. Thyroid Function Tests: No results for input(s): TSH, T4TOTAL, FREET4, T3FREE, THYROIDAB in the last 72 hours. Anemia Panel: No results for input(s): VITAMINB12, FOLATE, FERRITIN, TIBC, IRON, RETICCTPCT in the last 72 hours. Sepsis Labs: Recent Labs  Lab 06/03/18 1341  LATICACIDVEN 1.02    Recent Results (from the past 240 hour(s))  Blood culture (routine x  2)     Status: None (Preliminary result)   Collection Time: 06/03/18  1:32 PM  Result Value Ref Range Status   Specimen Description   Final    BLOOD RIGHT ANTECUBITAL Performed at [facility] [address] [place], [place] [address]    Special Requests   Final    BOTTLES DRAWN AEROBIC AND ANAEROBIC Blood Culture results may not be optimal due to an excessive volume of blood received in culture bottles Performed at [facility] [address] [place], [place] [address]    Culture   Final    NO GROWTH 2 DAYS Performed at [facility] [address] [place], [place] [address]    Report Status PENDING  Incomplete  Blood culture (routine x 2)     Status: None (Preliminary result)   Collection Time: 06/03/18  1:45 PM  Result Value Ref Range Status    Specimen Description   Final    BLOOD LEFT WRIST Performed at [facility] [address], [place], [place] [address]    Special Requests   Final    BOTTLES DRAWN AEROBIC AND ANAEROBIC Blood Culture adequate volume Performed at [facility] [address], [place], [place] [address]    Culture   Final    NO GROWTH 2 DAYS Performed at [facility] [address] [place], [place] [address]    Report Status PENDING  Incomplete  Surgical pcr screen     Status: Abnormal   Collection Time: 06/03/18 10:51 PM  Result Value Ref Range Status   MRSA, PCR POSITIVE (A) NEGATIVE Final    Comment: RESULT CALLED TO, READ BACK BY AND VERIFIED WITHBeulah Gandy RN [phone] 06/04/18 A BROWNING    Staphylococcus aureus POSITIVE (A) NEGATIVE Final    Comment: (NOTE) The Xpert SA Assay (FDA approved for NASAL specimens in patients 58 years of age and older), is one component of a comprehensive surveillance program. It is not intended to diagnose infection nor to guide or monitor treatment. Performed at [facility], [place] [address] [place], [place] [address]          Radiology Studies: Dg Chest 2 View  Result Date: 06/03/2018 CLINICAL DATA:  [place] EXAM: CHEST - 2 VIEW COMPARISON:  11/26/2015 FINDINGS: Generalized interstitial coarsening without Kerley lines or effusion. Heart size is normal. No collapse or consolidation. IMPRESSION: Bronchitic markings without focal pneumonia. Electronically Signed   By: Monte Fantasia M.D.   On: 06/03/2018 14:47   Dg Tibia/fibula Left  Result Date: 06/03/2018 CLINICAL DATA:  Worsening left leg infection x3 days. EXAM: LEFT TIBIA AND FIBULA - 2 VIEW COMPARISON:  None. FINDINGS: Overlapping AP and lateral views of the left tibia and fibula are provided. Soft tissue nodularity is seen along the lateral aspect of the knee and leg with moderate soft tissue swelling noted about the ankle. No acute fracture, bone  destruction or suspicious osseous lesions. Osteoarthritic joint space narrowing of the medial femorotibial compartment with spurring of the tibial spine is identified. No joint effusion is seen. IMPRESSION: 1. Soft tissue nodularity along the lateral aspect of the knee and leg with moderate soft tissue swelling about the ankle. 2. 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 3. No acute osseous abnormality. No evidence of bone destruction/osteomyelitis. Electronically Signed   By: Ashley Royalty M.D.   On: 06/03/2018 14:52   Mr Femur Left Wo Contrast  Result Date: 06/04/2018 CLINICAL DATA:  Erythema and edema of the left thigh EXAM: MR OF THE LEFT FEMUR WITHOUT CONTRAST TECHNIQUE: Multiplanar, multisequence MR imaging of the left femur was performed. No intravenous contrast was administered. COMPARISON:  None. FINDINGS: Bones/Joint/Cartilage No marrow signal abnormality. No fracture or dislocation. Normal alignment. No joint effusion. No periosteal reaction or bone destruction. No aggressive osseous lesion. Ligaments, Muscles and Tendons Muscles are normal. No muscle atrophy. No intramuscular fluid collection or hematoma. Soft tissue No fluid collection or hematoma. No soft tissue mass. Generalized mild soft tissue edema in the subcutaneous fat surrounding the left thigh. IMPRESSION: 1. Generalized mild soft tissue edema in the subcutaneous fat surrounding the left thigh. Differential and considerations include cellulitis versus venous stasis. Electronically Signed   By: Kathreen Devoid   On: 06/04/2018 13:33   Dg Foot Complete Left  Result Date: 06/03/2018 CLINICAL DATA:  Worsening infection. EXAM: LEFT FOOT - COMPLETE 3+ VIEW COMPARISON:  MRI 03/26/2017.  Left foot series 10/02/2016. FINDINGS: Prior amputation left first MTP. Destructive changes noted of the distal aspect of the proximal phalanx and proximal aspect of the middle phalanx of the left second digit. Destructive changes noted  of the distal portion of the distal phalanx of the left second digit. These findings are suggestive of osteomyelitis. No other focal bony abnormality identified. IMPRESSION: 1. Destructive changes of the proximal and middle phalanges of the left second digit suggesting osteomyelitis. Septic arthritis at the proximal interphalangeal joint space may be present. 2. Structures changes in the distal tip of the distal phalanx of the left second digit suggesting osteomyelitis. Electronically Signed   By: Marcello Moores  Register   On: 06/03/2018 14:50        Scheduled Meds: . atorvastatin  20 mg Oral QHS  . buPROPion  300 mg Oral QHS  . Chlorhexidine Gluconate Cloth  6 each Topical Q0600  . finasteride  5 mg Oral Daily  . insulin aspart  0-15 Units Subcutaneous TID WC  . ipratropium-albuterol  3 mL Nebulization Once  . ipratropium-albuterol  3 mL Nebulization TID  . loratadine  10 mg Oral Daily  . metoprolol tartrate  25 mg Oral Daily  . metroNIDAZOLE  500 mg Oral Q8H  . morphine  15 mg Oral Q12H  . mupirocin ointment  1 application Nasal BID  . nicotine  14 mg Transdermal Daily  . PARoxetine  20 mg Oral QHS  . [START ON 06/06/2018] predniSONE  5 mg Oral Q breakfast  . pregabalin  150 mg Oral BID  . senna-docusate  2 tablet Oral QHS  . tamsulosin  0.8 mg Oral QPC supper  . vitamin B-12  1,000 mcg Oral Daily   Continuous Infusions: . sodium chloride    . ceFEPime (MAXIPIME) IV Stopped (06/04/18 1447)  . vancomycin 1,250 mg (06/05/18 0237)     LOS: 2 days    Time spent: 35 mins.More than 50% of that time was spent in counseling and/or coordination of care.      Shelly Coss, MD [facility] Pager [phone]  If 7PM-7AM, please contact night-coverage [email or web address] Password [email or web address] 06/05/2018, 11:56 AM

## 2018-06-05 NOTE — Progress Notes (Signed)
Patient ID: Gregory [name], male   DOB: [birth year], 59 y.o.   MRN: [id number] The cellulitis in the left lower extremity is improving he still has cellulitis in the groin region.  Will plan for surgery tomorrow for amputation of the second toe.  MRI scan did not show any areas of abscess to be decompressed.

## 2018-06-05 NOTE — Progress Notes (Signed)
Pharmacy Antibiotic Note  Gregory [name] is a 57 y.o. male admitted on 06/03/2018 with medical history significant for osteomyelitis, epidural abscess, diabetes, CKD stage III, CHF, MRSA presenting from nursing facility for 72-hour history of left foot pain and drainage.  Pharmacy has been consulted for vancomycin and Zosyn dosing for osteomyelitis/wound infection.  MRI showed cellulitis vs venous stasis.  Renal function is improving, afebrile, WBC trending down.  Noted that patient received vancomycin 1250mg  IV this AM.   Plan: Vanc 500mg  IV x 1, then 1750mg  IV Q36H for AUC 487 using SCr 2.57 Change cefepime to 2gm IV Q24H Flagyl 500mg  IV Q8H per MD Monitor renal fxn, clinical progress, vanc levels as indicated  F/u VTE px, change IVF to D5NS d/t hypoglycemia, abx LOT post amputation on 12/20    Height: 6' (182.9 cm) Weight: 254 lb 10.1 oz (115.5 kg) IBW/kg (Calculated) : 77.6  Temp (24hrs), Avg:98.2 F (36.8 C), Min:97.8 F (36.6 C), Max:98.9 F (37.2 C)  Recent Labs  Lab 06/03/18 1333 06/03/18 1341 06/04/18 0200 06/05/18 0243  WBC 14.9*  --  15.1* 14.2*  CREATININE 3.24* 3.30* 3.11* 2.57*  LATICACIDVEN  --  1.02  --   --     Estimated Creatinine Clearance: 40.6 mL/min (A) (by C-G formula based on SCr of 2.57 mg/dL (H)).    Allergies  Allergen Reactions  . Latex Other (See Comments)    Unknown - per [facility]    Vanc 12/17 >> Cefepime 12/17 >> Flagyl 12/17 >> Keflex PTA Azith PTA  12/17 surgical screen - MRSA/SA 12/17 BCx - NGTD   Manan Olmo D. Mina Marble, PharmD, BCPS, [place] 06/05/2018, 12:54 PM

## 2018-06-06 ENCOUNTER — Encounter (HOSPITAL_COMMUNITY)
Admission: EM | Disposition: A | Discharge status: Skilled Nursing Facility | Payer: Self-pay | Source: Home / Self Care | Attending: Internal Medicine

## 2018-06-06 ENCOUNTER — Inpatient Hospital Stay (HOSPITAL_COMMUNITY): Payer: Medicaid Other | Admitting: Certified Registered"

## 2018-06-06 ENCOUNTER — Encounter (HOSPITAL_COMMUNITY): Payer: Self-pay | Admitting: Surgery

## 2018-06-06 DIAGNOSIS — R21 Rash and other nonspecific skin eruption: Secondary | ICD-10-CM

## 2018-06-06 DIAGNOSIS — L02612 Cutaneous abscess of left foot: Secondary | ICD-10-CM

## 2018-06-06 DIAGNOSIS — R6520 Severe sepsis without septic shock: Secondary | ICD-10-CM

## 2018-06-06 DIAGNOSIS — J449 Chronic obstructive pulmonary disease, unspecified: Secondary | ICD-10-CM

## 2018-06-06 DIAGNOSIS — D72829 Elevated white blood cell count, unspecified: Secondary | ICD-10-CM

## 2018-06-06 DIAGNOSIS — L8993 Pressure ulcer of unspecified site, stage 3: Secondary | ICD-10-CM

## 2018-06-06 DIAGNOSIS — L8990 Pressure ulcer of unspecified site, unspecified stage: Secondary | ICD-10-CM

## 2018-06-06 DIAGNOSIS — A4101 Sepsis due to Methicillin susceptible Staphylococcus aureus: Secondary | ICD-10-CM

## 2018-06-06 HISTORY — PX: AMPUTATION: SHX166

## 2018-06-06 LAB — CBC WITH DIFFERENTIAL/PLATELET
Abs Immature Granulocytes: 0.4 10*3/uL — ABNORMAL HIGH (ref 0.00–0.07)
BASOS PCT: 0 %
Basophils Absolute: 0 10*3/uL (ref 0.0–0.1)
EOS ABS: 0.2 10*3/uL (ref 0.0–0.5)
Eosinophils Relative: 1 %
HCT: 27.2 % — ABNORMAL LOW (ref 39.0–52.0)
Hemoglobin: 8.3 g/dL — ABNORMAL LOW (ref 13.0–17.0)
Immature Granulocytes: 3 %
Lymphocytes Relative: 8 %
Lymphs Abs: 1.2 10*3/uL (ref 0.7–4.0)
MCH: 27.9 pg (ref 26.0–34.0)
MCHC: 30.5 g/dL (ref 30.0–36.0)
MCV: 91.3 fL (ref 80.0–100.0)
Monocytes Absolute: 1.1 10*3/uL — ABNORMAL HIGH (ref 0.1–1.0)
Monocytes Relative: 7 %
Neutro Abs: 12.5 10*3/uL — ABNORMAL HIGH (ref 1.7–7.7)
Neutrophils Relative %: 81 %
PLATELETS: 267 10*3/uL (ref 150–400)
RBC: 2.98 MIL/uL — ABNORMAL LOW (ref 4.22–5.81)
RDW: 16.2 % — ABNORMAL HIGH (ref 11.5–15.5)
WBC: 15.4 10*3/uL — ABNORMAL HIGH (ref 4.0–10.5)
nRBC: 0 % (ref 0.0–0.2)

## 2018-06-06 LAB — BASIC METABOLIC PANEL
Anion gap: 11 (ref 5–15)
BUN: 47 mg/dL — AB (ref 6–20)
CO2: 20 mmol/L — ABNORMAL LOW (ref 22–32)
Calcium: 8.6 mg/dL — ABNORMAL LOW (ref 8.9–10.3)
Chloride: 111 mmol/L (ref 98–111)
Creatinine, Ser: 2.07 mg/dL — ABNORMAL HIGH (ref 0.61–1.24)
GFR calc Af Amer: 39 mL/min — ABNORMAL LOW (ref >60)
GFR calc non Af Amer: 34 mL/min — ABNORMAL LOW (ref >60)
Glucose, Bld: 142 mg/dL — ABNORMAL HIGH (ref 70–99)
Potassium: 4.6 mmol/L (ref 3.5–5.1)
Sodium: 142 mmol/L (ref 135–145)

## 2018-06-06 LAB — GLUCOSE, CAPILLARY
GLUCOSE-CAPILLARY: 168 mg/dL — AB (ref 70–99)
Glucose-Capillary: 119 mg/dL — ABNORMAL HIGH (ref 70–99)
Glucose-Capillary: 142 mg/dL — ABNORMAL HIGH (ref 70–99)
Glucose-Capillary: 174 mg/dL — ABNORMAL HIGH (ref 70–99)
Glucose-Capillary: 123 mg/dL — ABNORMAL HIGH (ref 70–99)

## 2018-06-06 SURGERY — AMPUTATION DIGIT
Anesthesia: General | Laterality: Left

## 2018-06-06 MED ORDER — DOCUSATE SODIUM 100 MG PO CAPS
100.0000 mg | ORAL_CAPSULE | Freq: Two times a day (BID) | ORAL | Status: DC
  Administered 2018-06-06 – 2018-06-09 (×5): 100 mg via ORAL
  Filled 2018-06-06 (×5): qty 1

## 2018-06-06 MED ORDER — ONDANSETRON HCL 4 MG/2ML IJ SOLN
INTRAMUSCULAR | Status: AC
  Filled 2018-06-06: qty 2

## 2018-06-06 MED ORDER — PROPOFOL 10 MG/ML IV BOLUS
INTRAVENOUS | Status: DC | PRN
  Administered 2018-06-06: 150 mg via INTRAVENOUS

## 2018-06-06 MED ORDER — ONDANSETRON HCL 4 MG/2ML IJ SOLN
4.0000 mg | Freq: Four times a day (QID) | INTRAMUSCULAR | Status: DC | PRN
  Administered 2018-06-08: 4 mg via INTRAVENOUS
  Filled 2018-06-06: qty 2

## 2018-06-06 MED ORDER — SODIUM CHLORIDE 0.9 % IV SOLN
2.0000 g | INTRAVENOUS | Status: DC
  Administered 2018-06-06 – 2018-06-08 (×3): 2 g via INTRAVENOUS
  Filled 2018-06-06 (×4): qty 20

## 2018-06-06 MED ORDER — METHOCARBAMOL 500 MG PO TABS
500.0000 mg | ORAL_TABLET | Freq: Four times a day (QID) | ORAL | Status: DC | PRN
  Administered 2018-06-07 – 2018-06-09 (×6): 500 mg via ORAL
  Filled 2018-06-06 (×6): qty 1

## 2018-06-06 MED ORDER — SODIUM CHLORIDE 0.9 % IV SOLN
INTRAVENOUS | Status: DC
  Administered 2018-06-06: 12:00:00 via INTRAVENOUS

## 2018-06-06 MED ORDER — SODIUM CHLORIDE 0.9 % IV SOLN
2.0000 g | Freq: Two times a day (BID) | INTRAVENOUS | Status: DC
  Administered 2018-06-06: 2 g via INTRAVENOUS
  Filled 2018-06-06 (×2): qty 2

## 2018-06-06 MED ORDER — 0.9 % SODIUM CHLORIDE (POUR BTL) OPTIME
TOPICAL | Status: DC | PRN
  Administered 2018-06-06: 1000 mL

## 2018-06-06 MED ORDER — BISACODYL 10 MG RE SUPP: 10.0000 mg | Freq: Every day | RECTAL | Status: DC | PRN

## 2018-06-06 MED ORDER — FENTANYL CITRATE (PF) 250 MCG/5ML IJ SOLN
INTRAMUSCULAR | Status: AC
  Filled 2018-06-06: qty 5

## 2018-06-06 MED ORDER — CEFAZOLIN SODIUM-DEXTROSE 2-4 GM/100ML-% IV SOLN
2.0000 g | INTRAVENOUS | Status: DC
  Filled 2018-06-06: qty 100

## 2018-06-06 MED ORDER — FENTANYL CITRATE (PF) 100 MCG/2ML IJ SOLN: 25.0000 ug | INTRAMUSCULAR | Status: DC | PRN

## 2018-06-06 MED ORDER — SODIUM CHLORIDE 0.9 % IV SOLN: INTRAVENOUS | Status: DC

## 2018-06-06 MED ORDER — METOCLOPRAMIDE HCL 5 MG/ML IJ SOLN: 5.0000 mg | Freq: Three times a day (TID) | INTRAMUSCULAR | Status: DC | PRN

## 2018-06-06 MED ORDER — MIDAZOLAM HCL 2 MG/2ML IJ SOLN
INTRAMUSCULAR | Status: AC
  Filled 2018-06-06: qty 2

## 2018-06-06 MED ORDER — MIDAZOLAM HCL 5 MG/5ML IJ SOLN
INTRAMUSCULAR | Status: DC | PRN
  Administered 2018-06-06: 2 mg via INTRAVENOUS

## 2018-06-06 MED ORDER — METOCLOPRAMIDE HCL 5 MG PO TABS: 5.0000 mg | ORAL_TABLET | Freq: Three times a day (TID) | ORAL | Status: DC | PRN

## 2018-06-06 MED ORDER — ONDANSETRON HCL 4 MG/2ML IJ SOLN
INTRAMUSCULAR | Status: DC | PRN
  Administered 2018-06-06: 4 mg via INTRAVENOUS

## 2018-06-06 MED ORDER — VANCOMYCIN HCL 10 G IV SOLR
1250.0000 mg | INTRAVENOUS | Status: DC
  Filled 2018-06-06 (×2): qty 1250

## 2018-06-06 MED ORDER — LACTATED RINGERS IV SOLN: INTRAVENOUS | Status: DC

## 2018-06-06 MED ORDER — METOCLOPRAMIDE HCL 5 MG/ML IJ SOLN: 10.0000 mg | Freq: Once | INTRAMUSCULAR | Status: DC | PRN

## 2018-06-06 MED ORDER — POLYETHYLENE GLYCOL 3350 17 G PO PACK: 17.0000 g | PACK | Freq: Every day | ORAL | Status: DC | PRN

## 2018-06-06 MED ORDER — MAGNESIUM CITRATE PO SOLN: 1.0000 | Freq: Once | ORAL | Status: DC | PRN

## 2018-06-06 MED ORDER — ONDANSETRON HCL 4 MG PO TABS
4.0000 mg | ORAL_TABLET | Freq: Four times a day (QID) | ORAL | Status: DC | PRN
  Administered 2018-06-07 – 2018-06-08 (×2): 4 mg via ORAL
  Filled 2018-06-06 (×2): qty 1

## 2018-06-06 MED ORDER — MEPERIDINE HCL 50 MG/ML IJ SOLN: 6.2500 mg | INTRAMUSCULAR | Status: DC | PRN

## 2018-06-06 MED ORDER — METHOCARBAMOL 1000 MG/10ML IJ SOLN
500.0000 mg | Freq: Four times a day (QID) | INTRAVENOUS | Status: DC | PRN
  Filled 2018-06-06: qty 5

## 2018-06-06 MED ORDER — LIDOCAINE 2% (20 MG/ML) 5 ML SYRINGE
INTRAMUSCULAR | Status: DC | PRN
  Administered 2018-06-06: 100 mg via INTRAVENOUS

## 2018-06-06 SURGICAL SUPPLY — 38 items
BLADE BEAVER 45 BD (BLADE) ×1 IMPLANT
BLADE SURG 21 STRL SS (BLADE) ×2 IMPLANT
BNDG CMPR 9X4 STRL LF SNTH (GAUZE/BANDAGES/DRESSINGS)
BNDG COHESIVE 4X5 TAN STRL (GAUZE/BANDAGES/DRESSINGS) ×1 IMPLANT
BNDG ESMARK 4X9 LF (GAUZE/BANDAGES/DRESSINGS) IMPLANT
BNDG GAUZE ELAST 4 BULKY (GAUZE/BANDAGES/DRESSINGS) ×1 IMPLANT
COVER SURGICAL LIGHT HANDLE (MISCELLANEOUS) ×3 IMPLANT
COVER WAND RF STERILE (DRAPES) ×1 IMPLANT
DRAPE INCISE IOBAN 66X45 STRL (DRAPES) ×1 IMPLANT
DRAPE U-SHAPE 47X51 STRL (DRAPES) ×2 IMPLANT
DRSG ADAPTIC 3X8 NADH LF (GAUZE/BANDAGES/DRESSINGS) IMPLANT
DRSG PAD ABDOMINAL 8X10 ST (GAUZE/BANDAGES/DRESSINGS) ×1 IMPLANT
DURAPREP 26ML APPLICATOR (WOUND CARE) ×2 IMPLANT
ELECT REM PT RETURN 9FT ADLT (ELECTROSURGICAL) ×2
ELECTRODE REM PT RTRN 9FT ADLT (ELECTROSURGICAL) ×1 IMPLANT
GAUZE SPONGE 4X4 12PLY STRL (GAUZE/BANDAGES/DRESSINGS) IMPLANT
GAUZE SPONGE 4X4 12PLY STRL LF (GAUZE/BANDAGES/DRESSINGS) IMPLANT
GLOVE BIOGEL PI IND STRL 9 (GLOVE) ×1 IMPLANT
GLOVE BIOGEL PI INDICATOR 9 (GLOVE) ×1
GLOVE SURG ORTHO 9.0 STRL STRW (GLOVE) ×2 IMPLANT
GOWN STRL REUS W/ TWL XL LVL3 (GOWN DISPOSABLE) ×2 IMPLANT
GOWN STRL REUS W/TWL XL LVL3 (GOWN DISPOSABLE) ×4
KIT BASIN OR (CUSTOM PROCEDURE TRAY) ×2 IMPLANT
KIT TURNOVER KIT B (KITS) ×2 IMPLANT
MANIFOLD NEPTUNE II (INSTRUMENTS) ×2 IMPLANT
NEEDLE 22X1 1/2 (OR ONLY) (NEEDLE) IMPLANT
NS IRRIG 1000ML POUR BTL (IV SOLUTION) ×2 IMPLANT
PACK ORTHO EXTREMITY (CUSTOM PROCEDURE TRAY) ×2 IMPLANT
PAD ABD 8X10 STRL (GAUZE/BANDAGES/DRESSINGS) IMPLANT
PAD ARMBOARD 7.5X6 YLW CONV (MISCELLANEOUS) ×2 IMPLANT
PREVENA RESTOR ARTHOFORM 33X30 (CANNISTER) ×2 IMPLANT
SUT ETHILON 2 0 PSLX (SUTURE) ×2 IMPLANT
SWABSTICK BENZOIN STERILE (MISCELLANEOUS) ×2 IMPLANT
SYR CONTROL 10ML LL (SYRINGE) IMPLANT
TOWEL OR 17X26 10 PK STRL BLUE (TOWEL DISPOSABLE) ×2 IMPLANT
TUBE CONNECTING 20X1/4 (TUBING) ×1 IMPLANT
WND VAC CANISTER 500ML (MISCELLANEOUS) ×1 IMPLANT
YANKAUER SUCT BULB TIP NO VENT (SUCTIONS) ×1 IMPLANT

## 2018-06-06 NOTE — Progress Notes (Signed)
Pharmacy Antibiotic Note  Gregory [name] is a 57 y.o. male admitted on 06/03/2018 with medical history significant for osteomyelitis, epidural abscess, diabetes, CKD stage III, CHF, MRSA presenting from nursing facility for 72-hour history of left foot pain and drainage.  Pharmacy has been consulted for vancomycin and Zosyn dosing for osteomyelitis/wound infection.  MRI showed cellulitis vs venous stasis.  Renal function is improving to SCr 2.07, afebrile.  Plan: Change vancomycin to 1250mg  IV q24h for improving SCr Change cefepime to 2gm IV Q24H Flagyl 500mg  IV Q8H per MD Monitor renal fxn, clinical progress, vanc levels as indicated F/u plans post-amputation 12/20   Height: 6' (182.9 cm) Weight: 254 lb 10.1 oz (115.5 kg) IBW/kg (Calculated) : 77.6  Temp (24hrs), Avg:98.9 F (37.2 C), Min:98.9 F (37.2 C), Max:98.9 F (37.2 C)  Recent Labs  Lab 06/03/18 1333 06/03/18 1341 06/04/18 0200 06/05/18 0243 06/06/18 0230  WBC 14.9*  --  15.1* 14.2* 15.4*  CREATININE 3.24* 3.30* 3.11* 2.57* 2.07*  LATICACIDVEN  --  1.02  --   --   --     Estimated Creatinine Clearance: 50.4 mL/min (A) (by C-G formula based on SCr of 2.07 mg/dL (H)).    Allergies  Allergen Reactions  . Latex Other (See Comments)    UNSPECIFIED REACTION     Vanc 12/17 >> Cefepime 12/17 >> Flagyl 12/17 >> Keflex PTA Azith PTA  12/17 surgical screen - MRSA/SA 12/17 BCx - NGTD  Elicia Lamp, PharmD, BCPS Clinical Pharmacist Clinical phone [phone] Please check AMION for all [facility] contact numbers 06/06/2018 11:10 AM

## 2018-06-06 NOTE — Interval H&P Note (Signed)
History and Physical Interval Note:  06/06/2018 8:01 AM  Gregory [name]  has presented today for surgery, with the diagnosis of Osteomyelitis Left 2nd Toe  The various methods of treatment have been discussed with the patient and family. After consideration of risks, benefits and other options for treatment, the patient has consented to  Procedure(s): LEFT 2ND TOE AMPUTATION (Left) as a surgical intervention .  The patient's history has been reviewed, patient examined, no change in status, stable for surgery.  I have reviewed the patient's chart and labs.  Questions were answered to the patient's satisfaction.     [place] [phone]

## 2018-06-06 NOTE — Progress Notes (Signed)
Gregory [name] for Infectious Disease  Date of Admission:  06/03/2018   Total days of antibiotics 4        Keflex 12/16-12/17       Azithromycin 12/16-12/17      Zosyn 12/17      Vancomycin 12/17-present                                                                                     Cefepime 12/18-present                                                                                     Metronidazole 12/18-present ASSESSMENT: Sepsis secondary to osteomyelitis and septic arthritis of the left foot - Patient continues to be afebrile and normotensive. Leukocytosis is increased to 15.4 today. Blood cultures with no growth to date. - Clinically patient appears similar to yesterday without advancement of the erythema and edema up his leg.  - Patient refused MRI of the lower extremity. While it would have been helpful to have imaging without signs of nec fasc, this is unlikely given that his disease is not rapidly progressing.  - OR today. If only the second digit is amputated, he would benefit from continued IV antibiotic therapy post-operatively, which can be managed at his SNF when he is suitable for discharge. Cultures taken during surgery may help guide antibiotic therapy. - Kidney function appears to be back to baseline despite vancomycin therapy.  PLAN: 1. Continue vancomycin, cefepime, and metronidazole 2. OR today for second digit amputation   Principal Problem:   Acute osteomyelitis of left foot (HCC) Active Problems:   COPD (chronic obstructive pulmonary disease) (HCC)   Sepsis with acute renal failure without septic shock (HCC)   Chronic diastolic CHF (congestive heart failure) (HCC)   Type II diabetes mellitus with neurological  manifestations (HCC)   Chronic pain   Depression, recurrent (HCC)   Septic arthritis (HCC)   Severe protein-calorie malnutrition (HCC)   Cellulitis of left leg   Scheduled Meds: . atorvastatin  20 mg Oral QHS  . buPROPion  300 mg Oral QHS  . Chlorhexidine Gluconate Cloth  6 each Topical Q0600  . finasteride  5 mg Oral Daily  . insulin aspart  0-15 Units Subcutaneous TID WC  . ipratropium-albuterol  3 mL Nebulization Once  . ipratropium-albuterol  3 mL Nebulization TID  . loratadine  10 mg Oral Daily  . metoprolol tartrate  25 mg Oral Daily  . metroNIDAZOLE  500 mg Oral Q8H  . morphine  15 mg Oral Q12H  . mupirocin ointment  1 application Nasal BID  . nicotine  14 mg Transdermal Daily  . PARoxetine  20 mg Oral QHS  . predniSONE  5 mg Oral Q breakfast  . pregabalin  150 mg Oral BID  .  senna-docusate  2 tablet Oral QHS  . tamsulosin  0.8 mg Oral QPC supper  . vitamin B-12  1,000 mcg Oral Daily   Continuous Infusions: . sodium chloride 75 mL/hr at 06/06/18 0042  . ceFEPime (MAXIPIME) IV 100 mL/hr at 06/05/18 1500  . [START ON 06/07/2018] vancomycin     PRN Meds:.acetaminophen **OR** acetaminophen, albuterol, guaiFENesin, lidocaine, LORazepam, menthol-cetylpyridinium, ondansetron **OR** ondansetron (ZOFRAN) IV, oxyCODONE, tiZANidine, zolpidem   SUBJECTIVE: Gregory [name] reports that his leg is about the same as yesterday. He refused to get the MRI of his left foot yesterday because of his unpleasant experience with MRI previously. He will be undergoing surgery today. He reports that he is living in a SNF currently and they can manage IV abx administration post-operatively.  Review of Systems: Review of Systems  Constitutional: Negative for chills and fever.  Cardiovascular: Positive for leg swelling.  Skin: Positive for rash.    Allergies  Allergen Reactions  . Latex Other (See Comments)    UNSPECIFIED REACTION     OBJECTIVE: Vitals:   06/05/18 0528 06/05/18 1413 06/05/18  2128 06/06/18 0513  BP: (!) 162/80 (!) 141/71 (!) 141/67 129/65  Pulse: 97 71 65 74  Resp: (!) 21 18 18 17   Temp: 98.9 F (37.2 C) 98.9 F (37.2 C) 98.9 F (37.2 C) 98.9 F (37.2 C)  TempSrc: Oral Oral Oral Oral  SpO2: 96% 94% 94% 95%  Weight:      Height:       Body mass index is 34.53 kg/m.  Physical Exam Gen: Lying comfortably in bed. No distress. MSK: Overall, his lower extremities appear similar to yesterday. See pictures added to chart. Right BKA without erythema or edema of the right thigh. The left lower extremity from the foot to the upper thigh is erythematous and edematous with increased warmth and tenderness to palpation. There are both intact and ruptured bullae primarily on the lateral leg and medial foot. There is a large bullae with a purplish appearance inferior to the medial malleolus of the left foot. Amputated left great toe. Left second digit with distal toe eschar.   Lab Results Lab Results  Component Value Date   WBC 15.4 (H) 06/06/2018   HGB 8.3 (L) 06/06/2018   HCT 27.2 (L) 06/06/2018   MCV 91.3 06/06/2018   PLT 267 06/06/2018    Lab Results  Component Value Date   CREATININE 2.07 (H) 06/06/2018   BUN 47 (H) 06/06/2018   NA 142 06/06/2018   K 4.6 06/06/2018   CL 111 06/06/2018   CO2 20 (L) 06/06/2018    Lab Results  Component Value Date   ALT 13 06/04/2018   AST 14 (L) 06/04/2018   ALKPHOS 107 06/04/2018   BILITOT 0.9 06/04/2018     Microbiology: Recent Results (from the past 240 hour(s))  Blood culture (routine x 2)     Status: None (Preliminary result)   Collection Time: 06/03/18  1:32 PM  Result Value Ref Range Status   Specimen Description   Final    BLOOD RIGHT ANTECUBITAL Performed at [facility], [place] [address] [place], [place] [address]    Special Requests   Final    BOTTLES DRAWN AEROBIC AND ANAEROBIC Blood Culture results may not be optimal due to an excessive volume of blood received in culture  bottles Performed at [place] [address], [place], [place] [address]    Culture   Final    NO GROWTH 2  DAYS Performed at [facility], [place] [address] [place], [place] [address]    Report Status PENDING  Incomplete  Blood culture (routine x 2)     Status: None (Preliminary result)   Collection Time: 06/03/18  1:45 PM  Result Value Ref Range Status   Specimen Description   Final    BLOOD LEFT WRIST Performed at [place] [address], [place], [place] [address]    Special Requests   Final    BOTTLES DRAWN AEROBIC AND ANAEROBIC Blood Culture adequate volume Performed at [place] [address] [place], [place] [address]    Culture   Final    NO GROWTH 2 DAYS Performed at [place] [address], [place], [place] [address]    Report Status PENDING  Incomplete  Surgical pcr screen     Status: Abnormal   Collection Time: 06/03/18 10:51 PM  Result Value Ref Range Status   MRSA, PCR POSITIVE (A) NEGATIVE Final    Comment: RESULT CALLED TO, READ BACK BY AND VERIFIED WITHBeulah Gandy RN [phone] 06/04/18 A BROWNING    Staphylococcus aureus POSITIVE (A) NEGATIVE Final    Comment: (NOTE) The Xpert SA Assay (FDA approved for NASAL specimens in patients 85 years of age and older), is one component of a comprehensive surveillance program. It is not intended to diagnose infection nor to guide or monitor treatment. Performed at [facility], [place] [address] [place], [place] [address]     Holbert Caples N Balen Woolum, MD Internal Medicine, PGY1 06/06/2018, 10:17 AM

## 2018-06-06 NOTE — Op Note (Signed)
06/06/2018  1:59 PM  PATIENT:  Gregory [name]    PRE-OPERATIVE DIAGNOSIS:  Osteomyelitis Left 2nd Toe  POST-OPERATIVE DIAGNOSIS: Osteomyelitis second toe left foot with large abscess of the left forefoot.  PROCEDURE: Left foot first and second ray amputation. Debridement of abscess left foot. Application of restore wound vacs x2.  SURGEON:  [name], MD  PHYSICIAN ASSISTANT:None ANESTHESIA:   General  PREOPERATIVE INDICATIONS:  Gregory [name] is a  57 y.o. male with a diagnosis of Osteomyelitis Left 2nd Toe who failed conservative measures and elected for surgical management.    The risks benefits and alternatives were discussed with the patient preoperatively including but not limited to the risks of infection, bleeding, nerve injury, cardiopulmonary complications, the need for revision surgery, among others, and the patient was willing to proceed.  OPERATIVE IMPLANTS: Restore wound vacs x2  @ENCIMAGES @  OPERATIVE FINDINGS: Patient had a large abscess of the left forefoot.  The abscess was sent for cultures.  OPERATIVE PROCEDURE: Patient was brought the operating room underwent a general anesthetic.  After adequate levels anesthesia were obtained patient's left lower extremity was prepped using DuraPrep draped into a sterile field.  A fishmouth incision was made just distal to the second toe MTP joint.  A large purulent abscess was encountered.  This required further resection of soft tissue back to healthy viable noninvolved tissue.  This required a resection of the distal third of the second and third metatarsals including the second toe.  The wound was irrigated with normal saline electrocautery was used for hemostasis.  The wound edges were not involved with the abscess.  Further dissection proximally did not show any ascending abscess.  After irrigation with normal saline electrocautery for hemostasis the wound was closed using 2-0 nylon.  2 [name] restore dressings were applied to  cover all of the soft tissue of the left leg to facilitate resolution of the edema with weeping blisters involving the entire left leg.  With palpation there is no fluctuance no palpable abscess no crepitation no clinical signs of necrotizing fasciitis.  Patient's leg was asymptomatic preoperatively with palpation up to the thigh.  After application of the restore dressings x2 this was overwrapped with Covan.  This had a good suction fit patient was extubated taken to PACU in stable condition.   DISCHARGE PLANNING:  Antibiotic duration: Continue IV antibiotics per infectious disease recommendations.  Cultures from the abscess are pending.  Weightbearing: Weightbearing on the left foot for transfers only  Pain medication: Continue current pain medication  Dressing care/ Wound VAC: Wound vacs to remain in place for 1 to 2 weeks  Ambulatory devices: Patient for transfer training only  Discharge to: Anticipate discharge to home.  Follow-up: In the office 1 week post operative.

## 2018-06-06 NOTE — Progress Notes (Signed)
PROGRESS NOTE    Gregory [name]  ZCH:[id number] DOB: 09-05-1960 DOA: 06/03/2018 PCP: [name], MD   Brief Narrative: Patient is a 57 year old male with past medical history of diabetes mellitus type 2, essential hypertension, chronic diastolic CHF, ureteral stricture status post recent cystoscopy and urethral dilation, chronic kidney disease stage III, right sided BKA who presented to the emergency department with complaints of pain, swelling, development of blebs/bulla on his left lower extremity.  He was on oral antibiotics as an outpatient but his symptoms got worse and he also started having fever.  Imagings done in the emergency department suggestive of osteomyelitis in the left foot.  ID and orthopedics consulted.Undergoing  for left 2nd toe amputation  by orthopedics today.  Assessment & Plan:   Principal Problem:   Acute osteomyelitis of left foot (HCC) Active Problems:   COPD (chronic obstructive pulmonary disease) ([place])   Sepsis with acute renal failure without septic shock (HCC)   Chronic diastolic CHF (congestive heart failure) (HCC)   Type II diabetes mellitus with neurological manifestations (HCC)   Chronic pain   Depression, recurrent (HCC)   Septic arthritis ([place])   Severe protein-calorie malnutrition ([place])   Cellulitis of left leg  Acute osteomyelitis/septic arthritis of the left foot/possible necrotizing fasciitis: X-ray showed: destructive changes of the proximal and middle phalanges of the left second digit suggesting osteomyelitis. Structural changes in the distal tip of the distal phalanx of the left second digit suggesting osteomyelitis Patient's left lower extremity was significantly swollen, tender and has bullae.  There was suspicion  for necrotizing fasciitis but MRI just showed soft tissue edema/cellulitis.  Left lower extremity swelling and erythema has been improving.  Ordered further MRI studies but patient denied. Continue vancomycin , cefepime and  Flagyl for now as per ID.  Underwent amputation of second toe on 06/06/2018. Follow-up blood cultures and bone biopsy.  Has leukocytosis, will follow up trend.  He is afebrile now.  Acute kidney injury on chronic kidney disease stage III: Looked dehydrated on presentation.  His baseline creatinine runs around 1.5-2.  Hold nephrotoxic agents.  Diuretics on hold.  Continue gentle hydration.  Creatinine improving.  Diabetes mellitus type 2: On insulin at home.  Hemoglobin A1c of 8.  Continue sliding his insulin here.  Normocytic anemia: Hemoglobin dropped most likely secondary to hemodilution.  Anemia secondary to chronic disease.  No evidence of bleeding.  We will continue to monitor CBC.  Chronic diastolic CHF: Diuretics on hold.  Patient appeared hypovolemic on presentation.  Continue gentle hydration.  Steroid use: He was started on prednisone about a month ago for unclear reason.  He was on 40 mg daily which has been cut down ,we will continue to taper and stop it.  History of ureteral stricture: Recently underwent dilation of the ureteral stricture by Dr. Karsten Ro.  Has been urinating fine.  Continue Flomax  Chronic pain syndrome: Continue current pain regimen.  Tobacco abuse: Continue nicotine patch         DVT prophylaxis:Heparin from tomorrow Code Status:Full  Family Communication: None at the bedside Disposition Plan: Undetermined at this point.  Needs to have surgery.Likely rehab.PT/OT will be ordered after surgery   Consultants: Orthopedics, ID  Procedures: None  Antimicrobials: Vancomycin day 4 Cefepime, Flagyl day 3  Subjective: Patient seen and examined the bedside today in the morning.  He is hemodynamically stable at present.  Lower extremity swelling, erythema is better.  Pain is better today.  Awaiting surgery  Objective: Vitals:  06/05/18 0528 06/05/18 1413 06/05/18 2128 06/06/18 0513  BP: (!) 162/80 (!) 141/71 (!) 141/67 129/65  Pulse: 97 71 65 74  Resp:  (!) 21 18 18 17   Temp: 98.9 F (37.2 C) 98.9 F (37.2 C) 98.9 F (37.2 C) 98.9 F (37.2 C)  TempSrc: Oral Oral Oral Oral  SpO2: 96% 94% 94% 95%  Weight:      Height:        Intake/Output Summary (Last 24 hours) at 06/06/2018 1307 Last data filed at 06/06/2018 1130 Gross per 24 hour  Intake 735.05 ml  Output 3475 ml  Net -2739.95 ml   Filed Weights   06/04/18 1247  Weight: 115.5 kg    Examination:  General exam: Not in distress,obese HEENT:PERRL,Oral mucosa moist, Ear/Nose normal on gross exam Respiratory system: Bilateral equal air entry, normal vesicular breath sounds, no wheezes or crackles  Cardiovascular system: S1 & S2 heard, RRR. No JVD, murmurs, rubs, gallops or clicks.  Gastrointestinal system: Abdomen is mildly distended, soft and nontender. No organomegaly or masses felt. Normal bowel sounds heard. Central nervous system: Alert and oriented. No focal neurological deficits. Extremities: Edema, erythema, tenderness of the left lower extremity with blebs/bullae but looking better slowly .  Left second toe dark in color.  Right-sided BKA Psychiatry: Judgement and insight appear normal. Mood & affect appropriate.     Data Reviewed: I have personally reviewed following labs and imaging studies  CBC: Recent Labs  Lab 06/03/18 1333 06/03/18 1341 06/04/18 0200 06/05/18 0243 06/06/18 0230  WBC 14.9*  --  15.1* 14.2* 15.4*  NEUTROABS 13.0*  --   --  12.5* 12.5*  HGB 8.7* 9.2* 7.8* 8.3* 8.3*  HCT 28.6* 27.0* 25.7* 27.1* 27.2*  MCV 90.8  --  89.2 90.6 91.3  PLT 178  --  172 210 376   Basic Metabolic Panel: Recent Labs  Lab 06/03/18 1333 06/03/18 1341 06/04/18 0200 06/05/18 0243 06/06/18 0230  NA 133* 136 138 141 142  K 4.8 4.8 4.4 4.2 4.6  CL 100 104 104 108 111  CO2 20*  --  20* 20* 20*  GLUCOSE 170* 174* 128* 76 142*  BUN 66* 60* 61* 56* 47*  CREATININE 3.24* 3.30* 3.11* 2.57* 2.07*  CALCIUM 8.4*  --  8.5* 8.4* 8.6*   GFR: Estimated Creatinine  Clearance: 50.4 mL/min (A) (by C-G formula based on SCr of 2.07 mg/dL (H)). Liver Function Tests: Recent Labs  Lab 06/03/18 1333 06/04/18 0200  AST 15 14*  ALT 13 13  ALKPHOS 114 107  BILITOT 0.9 0.9  PROT 6.6 6.5  ALBUMIN 2.8* 2.2*   No results for input(s): LIPASE, AMYLASE in the last 168 hours. No results for input(s): AMMONIA in the last 168 hours. Coagulation Profile: Recent Labs  Lab 06/04/18 0200  INR 1.18   Cardiac Enzymes: No results for input(s): CKTOTAL, CKMB, CKMBINDEX, TROPONINI in the last 168 hours. BNP (last 3 results) No results for input(s): PROBNP in the last 8760 hours. HbA1C: Recent Labs    06/04/18 0200  HGBA1C 8.0*   CBG: Recent Labs  Lab 06/05/18 1230 06/05/18 1719 06/05/18 2125 06/06/18 0824 06/06/18 1138  GLUCAP 109* 259* 181* 119* 142*   Lipid Profile: No results for input(s): CHOL, HDL, LDLCALC, TRIG, CHOLHDL, LDLDIRECT in the last 72 hours. Thyroid Function Tests: No results for input(s): TSH, T4TOTAL, FREET4, T3FREE, THYROIDAB in the last 72 hours. Anemia Panel: No results for input(s): VITAMINB12, FOLATE, FERRITIN, TIBC, IRON, RETICCTPCT in the last 72 hours.  Sepsis Labs: Recent Labs  Lab 06/03/18 1341  LATICACIDVEN 1.02    Recent Results (from the past 240 hour(s))  Blood culture (routine x 2)     Status: None (Preliminary result)   Collection Time: 06/03/18  1:32 PM  Result Value Ref Range Status   Specimen Description   Final    BLOOD RIGHT ANTECUBITAL Performed at [facility] [address] [place], [place] [address]    Special Requests   Final    BOTTLES DRAWN AEROBIC AND ANAEROBIC Blood Culture results may not be optimal due to an excessive volume of blood received in culture bottles Performed at [facility] [address], [place], [place] [address]    Culture   Final    NO GROWTH 3 DAYS Performed at [facility], [place] [address] [place], [place] [address]     Report Status PENDING  Incomplete  Blood culture (routine x 2)     Status: None (Preliminary result)   Collection Time: 06/03/18  1:45 PM  Result Value Ref Range Status   Specimen Description   Final    BLOOD LEFT WRIST Performed at [place] [address], [place], [place] [address]    Special Requests   Final    BOTTLES DRAWN AEROBIC AND ANAEROBIC Blood Culture adequate volume Performed at [facility] [address], [place], [place] [address]    Culture   Final    NO GROWTH 3 DAYS Performed at [facility], [place] [address] [place], [place] [address]    Report Status PENDING  Incomplete  Surgical pcr screen     Status: Abnormal   Collection Time: 06/03/18 10:51 PM  Result Value Ref Range Status   MRSA, PCR POSITIVE (A) NEGATIVE Final    Comment: RESULT CALLED TO, READ BACK BY AND VERIFIED WITHBeulah Gandy RN [phone] 06/04/18 A BROWNING    Staphylococcus aureus POSITIVE (A) NEGATIVE Final    Comment: (NOTE) The Xpert SA Assay (FDA approved for NASAL specimens in patients 41 years of age and older), is one component of a comprehensive surveillance program. It is not intended to diagnose infection nor to guide or monitor treatment. Performed at [facility], [place] [address] [place], [place] [address]          Radiology Studies: No results found.      Scheduled Meds: . [MAR Hold] atorvastatin  20 mg Oral QHS  . [MAR Hold] buPROPion  300 mg Oral QHS  . [MAR Hold] Chlorhexidine Gluconate Cloth  6 each Topical Q0600  . [MAR Hold] finasteride  5 mg Oral Daily  . [MAR Hold] insulin aspart  0-15 Units Subcutaneous TID WC  . [MAR Hold] ipratropium-albuterol  3 mL Nebulization Once  . [MAR Hold] ipratropium-albuterol  3 mL Nebulization TID  . [MAR Hold] loratadine  10 mg Oral Daily  . [MAR Hold] metoprolol tartrate  25 mg Oral Daily  . [MAR Hold] metroNIDAZOLE  500 mg Oral Q8H  . [MAR Hold] morphine  15 mg Oral Q12H    . [MAR Hold] mupirocin ointment  1 application Nasal BID  . [MAR Hold] nicotine  14 mg Transdermal Daily  . [MAR Hold] PARoxetine  20 mg Oral QHS  . [MAR Hold] predniSONE  5 mg Oral Q breakfast  . [MAR Hold] pregabalin  150 mg Oral BID  . [MAR Hold] senna-docusate  2 tablet Oral QHS  . [MAR Hold] tamsulosin  0.8 mg Oral QPC supper  . [MAR Hold] vitamin B-12  1,000 mcg Oral Daily   Continuous Infusions: . sodium chloride 75 mL/hr at 06/06/18 0042  . sodium chloride 10 mL/hr at 06/06/18 1217  . [START ON 06/07/2018]  ceFAZolin (ANCEF) IV    . [MAR Hold] ceFEPime (MAXIPIME) IV    . [MAR Hold] vancomycin       LOS: 3 days    Time spent: 35 mins.More than 50% of that time was spent in counseling and/or coordination of care.      Shelly Coss, MD [facility] Pager [phone]  If 7PM-7AM, please contact night-coverage [email or web address] Password [email or web address] 06/06/2018, 1:07 PM

## 2018-06-06 NOTE — Transfer of Care (Signed)
Immediate Anesthesia Transfer of Care Note  Patient: Gregory [name]  Procedure(s) Performed: LEFT 2ND TOE AMPUTATION (Left )  Patient Location: PACU  Anesthesia Type:General  Level of Consciousness: awake, alert , oriented and patient cooperative  Airway & Oxygen Therapy: Patient Spontanous Breathing and Patient connected to nasal cannula oxygen  Post-op Assessment: Report given to RN, Post -op Vital signs reviewed and stable and Patient moving all extremities  Post vital signs: Reviewed and stable  Last Vitals:  Vitals Value Taken Time  BP 122/66 06/06/2018  1:54 PM  Temp    Pulse 68 06/06/2018  1:56 PM  Resp 24 06/06/2018  1:56 PM  SpO2 100 % 06/06/2018  1:56 PM  Vitals shown include unvalidated device data.  Last Pain:  Vitals:   06/06/18 0755  TempSrc:   PainSc: 3          Complications: No apparent anesthesia complications

## 2018-06-06 NOTE — Anesthesia Preprocedure Evaluation (Signed)
Anesthesia Evaluation  Patient identified by MRN, date of birth, ID band Patient awake    Reviewed: Allergy & Precautions, H&P , NPO status , Patient's Chart, lab work & pertinent test results  Airway Mallampati: II   Neck ROM: full    Dental   Pulmonary COPD, former smoker,    breath sounds clear to auscultation       Cardiovascular hypertension, + Peripheral Vascular Disease and +CHF   Rhythm:regular Rate:Normal     Neuro/Psych PSYCHIATRIC DISORDERS Depression  Neuromuscular disease    GI/Hepatic GERD  ,  Endo/Other  diabetes, Type 2  Renal/GU Renal InsufficiencyRenal disease     Musculoskeletal  (+) Arthritis ,   Abdominal   Peds  Hematology   Anesthesia Other Findings   Reproductive/Obstetrics                             Anesthesia Physical  Anesthesia Plan  ASA: III  Anesthesia Plan: General   Post-op Pain Management:    Induction: Intravenous  PONV Risk Score and Plan: 2 and Ondansetron, Midazolam and Treatment may vary due to age or medical condition  Airway Management Planned: LMA  Additional Equipment:   Intra-op Plan:   Post-operative Plan:   Informed Consent: I have reviewed the patients History and Physical, chart, labs and discussed the procedure including the risks, benefits and alternatives for the proposed anesthesia with the patient or authorized representative who has indicated his/her understanding and acceptance.     Plan Discussed with: CRNA, Anesthesiologist and Surgeon  Anesthesia Plan Comments:         Anesthesia Quick Evaluation

## 2018-06-06 NOTE — Anesthesia Postprocedure Evaluation (Signed)
Anesthesia Post Note  Patient: Gregory [name]  Procedure(s) Performed: LEFT 2ND TOE AMPUTATION (Left )     Patient location during evaluation: PACU Anesthesia Type: General Level of consciousness: awake and alert Pain management: pain level controlled Vital Signs Assessment: post-procedure vital signs reviewed and stable Respiratory status: spontaneous breathing, nonlabored ventilation, respiratory function stable and patient connected to nasal cannula oxygen Cardiovascular status: blood pressure returned to baseline and stable Postop Assessment: no apparent nausea or vomiting Anesthetic complications: no    Last Vitals:  Vitals:   06/06/18 1410 06/06/18 1425  BP: 123/61 120/67  Pulse: 62 (!) 59  Resp: 13 15  Temp:  (!) 36.4 C  SpO2: 99% 95%    Last Pain:  Vitals:   06/06/18 1425  TempSrc:   PainSc: 0-No pain                 Montez Hageman

## 2018-06-06 NOTE — Anesthesia Procedure Notes (Signed)
Procedure Name: LMA Insertion Date/Time: 06/06/2018 1:08 PM Performed by: Myna Bright, CRNA Pre-anesthesia Checklist: Patient identified, Emergency Drugs available, Suction available and Patient being monitored Patient Re-evaluated:Patient Re-evaluated prior to induction Oxygen Delivery Method: Circle system utilized Preoxygenation: Pre-oxygenation with 100% oxygen Induction Type: IV induction Ventilation: Mask ventilation without difficulty LMA: LMA inserted LMA Size: 5.0 Tube type: Oral Number of attempts: 1 Placement Confirmation: positive ETCO2 and breath sounds checked- equal and bilateral Tube secured with: Tape Dental Injury: Teeth and Oropharynx as per pre-operative assessment

## 2018-06-06 NOTE — Social Work (Signed)
Pt from [facility], plan to return at discharge.   Westley Hummer, MSW, [place] Work [phone]

## 2018-06-07 ENCOUNTER — Encounter (HOSPITAL_COMMUNITY): Payer: Self-pay | Admitting: Orthopedic Surgery

## 2018-06-07 LAB — CBC WITH DIFFERENTIAL/PLATELET
Abs Immature Granulocytes: 0.32 10*3/uL — ABNORMAL HIGH (ref 0.00–0.07)
Basophils Absolute: 0.1 10*3/uL (ref 0.0–0.1)
Basophils Relative: 0 %
Eosinophils Absolute: 0.2 10*3/uL (ref 0.0–0.5)
Eosinophils Relative: 1 %
HCT: 25.3 % — ABNORMAL LOW (ref 39.0–52.0)
Hemoglobin: 7.8 g/dL — ABNORMAL LOW (ref 13.0–17.0)
Immature Granulocytes: 2 %
Lymphocytes Relative: 10 %
Lymphs Abs: 1.5 10*3/uL (ref 0.7–4.0)
MCH: 28.4 pg (ref 26.0–34.0)
MCHC: 30.8 g/dL (ref 30.0–36.0)
MCV: 92 fL (ref 80.0–100.0)
Monocytes Absolute: 1.1 10*3/uL — ABNORMAL HIGH (ref 0.1–1.0)
Monocytes Relative: 7 %
NRBC: 0 % (ref 0.0–0.2)
Neutro Abs: 11.3 10*3/uL — ABNORMAL HIGH (ref 1.7–7.7)
Neutrophils Relative %: 80 %
Platelets: 327 10*3/uL (ref 150–400)
RBC: 2.75 MIL/uL — ABNORMAL LOW (ref 4.22–5.81)
RDW: 16.1 % — ABNORMAL HIGH (ref 11.5–15.5)
WBC: 14.4 10*3/uL — ABNORMAL HIGH (ref 4.0–10.5)

## 2018-06-07 LAB — BASIC METABOLIC PANEL
ANION GAP: 10 (ref 5–15)
BUN: 37 mg/dL — ABNORMAL HIGH (ref 6–20)
CO2: 19 mmol/L — ABNORMAL LOW (ref 22–32)
Calcium: 8.3 mg/dL — ABNORMAL LOW (ref 8.9–10.3)
Chloride: 110 mmol/L (ref 98–111)
Creatinine, Ser: 1.82 mg/dL — ABNORMAL HIGH (ref 0.61–1.24)
GFR calc Af Amer: 46 mL/min — ABNORMAL LOW (ref >60)
GFR calc non Af Amer: 40 mL/min — ABNORMAL LOW (ref >60)
GLUCOSE: 198 mg/dL — AB (ref 70–99)
Potassium: 4.4 mmol/L (ref 3.5–5.1)
Sodium: 139 mmol/L (ref 135–145)

## 2018-06-07 LAB — GLUCOSE, CAPILLARY
Glucose-Capillary: 204 mg/dL — ABNORMAL HIGH (ref 70–99)
Glucose-Capillary: 167 mg/dL — ABNORMAL HIGH (ref 70–99)
Glucose-Capillary: 165 mg/dL — ABNORMAL HIGH (ref 70–99)
Glucose-Capillary: 126 mg/dL — ABNORMAL HIGH (ref 70–99)

## 2018-06-07 MED ORDER — HEPARIN SODIUM (PORCINE) 5000 UNIT/ML IJ SOLN
5000.0000 [IU] | Freq: Three times a day (TID) | INTRAMUSCULAR | Status: DC
  Administered 2018-06-07 – 2018-06-08 (×2): 5000 [IU] via SUBCUTANEOUS
  Filled 2018-06-07 (×3): qty 1

## 2018-06-07 MED ORDER — SALINE SPRAY 0.65 % NA SOLN
1.0000 | NASAL | Status: DC | PRN
  Administered 2018-06-07 – 2018-06-09 (×5): 1 via NASAL
  Filled 2018-06-07 (×2): qty 44

## 2018-06-07 MED ORDER — VANCOMYCIN HCL 10 G IV SOLR
1500.0000 mg | INTRAVENOUS | Status: DC
  Administered 2018-06-07 – 2018-06-08 (×2): 1500 mg via INTRAVENOUS
  Filled 2018-06-07 (×3): qty 1500

## 2018-06-07 NOTE — Progress Notes (Signed)
PROGRESS NOTE    Gregory [name]  UYQ:[id number] DOB: 1960-12-24 DOA: 06/03/2018 PCP: [name], MD   Brief Narrative: Patient is a 57 year old male with past medical history of diabetes mellitus type 2, essential hypertension, chronic diastolic CHF, ureteral stricture status post recent cystoscopy and urethral dilation, chronic kidney disease stage III, right sided BKA who presented to the emergency department with complaints of pain, swelling, development of blebs/bulla on his left lower extremity.  He was on oral antibiotics as an outpatient but his symptoms got worse and he also started having fever.  Imagings done in the emergency department suggestive of osteomyelitis in the left foot.  ID and orthopedics consulted.Underwent  Amputation  of  left 2nd toe   by orthopedics on 06/06/18.  Waiting for bone culture report.  Assessment & Plan:   Principal Problem:   Acute osteomyelitis of left foot (HCC) Active Problems:   COPD (chronic obstructive pulmonary disease) ([place])   Sepsis with acute renal failure without septic shock (HCC)   Chronic diastolic CHF (congestive heart failure) (HCC)   Type II diabetes mellitus with neurological manifestations (HCC)   Chronic pain   Depression, recurrent (HCC)   Septic arthritis ([place])   Severe protein-calorie malnutrition ([place])   Cellulitis of left leg   Cutaneous abscess of left foot   Pressure injury of skin  Acute osteomyelitis/septic arthritis of the left foot/possible necrotizing fasciitis: X-ray showed: destructive changes of the proximal and middle phalanges of the left second digit suggesting osteomyelitis. Structural changes in the distal tip of the distal phalanx of the left second digit suggesting osteomyelitis Patient's left lower extremity was significantly swollen, tender and has bullae.  There was suspicion  for necrotizing fasciitis but MRI just showed soft tissue edema/cellulitis.  Left lower extremity swelling and erythema  has been improving.  Ordered further MRI studies but patient denied. Continue vancomycin and ceftriaxone  for now as per ID.  Underwent amputation of second toe on 06/06/2018. Follow-up blood cultures and bone biopsy. NGTD.  Wound culture showing few gram-positive cocci.  Has leukocytosis, will follow up trend.  He has mild grade fever today.  Acute kidney injury on chronic kidney disease stage III: Looked dehydrated on presentation.  His baseline creatinine runs around 1.5-2.  Hold nephrotoxic agents.  Diuretics on hold.  Continue gentle hydration.  Creatinine improving.  Diabetes mellitus type 2: On insulin at home.  Hemoglobin A1c of 8.  Continue sliding his insulin here.  Normocytic anemia: Hemoglobin dropped most likely secondary to hemodilution.  Anemia secondary to chronic disease.  No evidence of bleeding.  We will continue to monitor CBC.  Chronic diastolic CHF: Diuretics on hold.  Patient appeared hypovolemic on presentation.  Continue gentle hydration.  Steroid use: He was started on prednisone about a month ago for unclear reason.  He was on 40 mg daily which has been cut down , tapered and stopped.  History of ureteral stricture: Recently underwent dilation of the ureteral stricture by Dr. Karsten Ro.  Has been urinating fine.  Continue Flomax  Chronic pain syndrome: Continue current pain regimen.  Tobacco abuse: Continue nicotine patch         DVT prophylaxis:Heparin [place] Code Status:Full  Family Communication: daughter Disposition Plan: Pending final culture report.  He will be discharged to skilled nursing facility after full work-up.   Consultants: Orthopedics, ID  Procedures: None  Antimicrobials: Vancomycin day 5 Ceftriaxone day 2  Subjective: Patient seen and examined the bedside today in the morning.  He  is hemodynamically stable at present.  Lower extremity swelling, erythema is better. Underwent amputation  Objective: Vitals:   06/06/18 2136 06/06/18 2347  06/07/18 0207 06/07/18 0602  BP: (!) 164/76 (!) 156/66 (!) 160/74 (!) 166/74  Pulse: 90 93 97 92  Resp: 16 18 17 14   Temp:   (!) 100.7 F (38.2 C) 100.3 F (37.9 C)  TempSrc:   Oral Oral  SpO2: 99% 95% 98% 97%  Weight:      Height:        Intake/Output Summary (Last 24 hours) at 06/07/2018 1457 Last data filed at 06/07/2018 1453 Gross per 24 hour  Intake 3954.09 ml  Output 3725 ml  Net 229.09 ml   Filed Weights   06/04/18 1247  Weight: 115.5 kg    Examination:  General exam: Not in distress,obese HEENT:PERRL,Oral mucosa moist, Ear/Nose normal on gross exam Respiratory system: Bilateral equal air entry, normal vesicular breath sounds, no wheezes or crackles  Cardiovascular system: S1 & S2 heard, RRR. No JVD, murmurs, rubs, gallops or clicks.  Gastrointestinal system: Abdomen is mildly distended, soft and nontender. No organomegaly or masses felt. Normal bowel sounds heard. Central nervous system: Alert and oriented. No focal neurological deficits. Extremities: Left lower extremity wrapped with dressing.  Right-sided BKA Psychiatry: Judgement and insight appear normal. Mood & affect appropriate.     Data Reviewed: I have personally reviewed following labs and imaging studies  CBC: Recent Labs  Lab 06/03/18 1333 06/03/18 1341 06/04/18 0200 06/05/18 0243 06/06/18 0230 06/07/18 0413  WBC 14.9*  --  15.1* 14.2* 15.4* 14.4*  NEUTROABS 13.0*  --   --  12.5* 12.5* 11.3*  HGB 8.7* 9.2* 7.8* 8.3* 8.3* 7.8*  HCT 28.6* 27.0* 25.7* 27.1* 27.2* 25.3*  MCV 90.8  --  89.2 90.6 91.3 92.0  PLT 178  --  172 210 267 607   Basic Metabolic Panel: Recent Labs  Lab 06/03/18 1333 06/03/18 1341 06/04/18 0200 06/05/18 0243 06/06/18 0230 06/07/18 0413  NA 133* 136 138 141 142 139  K 4.8 4.8 4.4 4.2 4.6 4.4  CL 100 104 104 108 111 110  CO2 20*  --  20* 20* 20* 19*  GLUCOSE 170* 174* 128* 76 142* 198*  BUN 66* 60* 61* 56* 47* 37*  CREATININE 3.24* 3.30* 3.11* 2.57* 2.07* 1.82*   CALCIUM 8.4*  --  8.5* 8.4* 8.6* 8.3*   GFR: Estimated Creatinine Clearance: 57.4 mL/min (A) (by C-G formula based on SCr of 1.82 mg/dL (H)). Liver Function Tests: Recent Labs  Lab 06/03/18 1333 06/04/18 0200  AST 15 14*  ALT 13 13  ALKPHOS 114 107  BILITOT 0.9 0.9  PROT 6.6 6.5  ALBUMIN 2.8* 2.2*   No results for input(s): LIPASE, AMYLASE in the last 168 hours. No results for input(s): AMMONIA in the last 168 hours. Coagulation Profile: Recent Labs  Lab 06/04/18 0200  INR 1.18   Cardiac Enzymes: No results for input(s): CKTOTAL, CKMB, CKMBINDEX, TROPONINI in the last 168 hours. BNP (last 3 results) No results for input(s): PROBNP in the last 8760 hours. HbA1C: No results for input(s): HGBA1C in the last 72 hours. CBG: Recent Labs  Lab 06/06/18 1353 06/06/18 1705 06/06/18 2132 06/07/18 0754 06/07/18 1157  GLUCAP 168* 174* 123* 204* 167*   Lipid Profile: No results for input(s): CHOL, HDL, LDLCALC, TRIG, CHOLHDL, LDLDIRECT in the last 72 hours. Thyroid Function Tests: No results for input(s): TSH, T4TOTAL, FREET4, T3FREE, THYROIDAB in the last 72 hours. Anemia Panel: No  results for input(s): VITAMINB12, FOLATE, FERRITIN, TIBC, IRON, RETICCTPCT in the last 72 hours. Sepsis Labs: Recent Labs  Lab 06/03/18 1341  LATICACIDVEN 1.02    Recent Results (from the past 240 hour(s))  Blood culture (routine x 2)     Status: None (Preliminary result)   Collection Time: 06/03/18  1:32 PM  Result Value Ref Range Status   Specimen Description   Final    BLOOD RIGHT ANTECUBITAL Performed at [facility] [address], [place], [place] [address]    Special Requests   Final    BOTTLES DRAWN AEROBIC AND ANAEROBIC Blood Culture results may not be optimal due to an excessive volume of blood received in culture bottles Performed at [facility] [address] [place], [place] [address]    Culture   Final    NO GROWTH 4  DAYS Performed at [facility], [place] [address], [place], [place] [address]    Report Status PENDING  Incomplete  Blood culture (routine x 2)     Status: None (Preliminary result)   Collection Time: 06/03/18  1:45 PM  Result Value Ref Range Status   Specimen Description   Final    BLOOD LEFT WRIST Performed at [facility] [address] [place], [place] [address]    Special Requests   Final    BOTTLES DRAWN AEROBIC AND ANAEROBIC Blood Culture adequate volume Performed at [facility] [address], [place], [place] [address]    Culture   Final    NO GROWTH 4 DAYS Performed at [facility], [place] [address] [place], [place] [address]    Report Status PENDING  Incomplete  Surgical pcr screen     Status: Abnormal   Collection Time: 06/03/18 10:51 PM  Result Value Ref Range Status   MRSA, PCR POSITIVE (A) NEGATIVE Final    Comment: RESULT CALLED TO, READ BACK BY AND VERIFIED WITHBeulah Gandy RN [phone] 06/04/18 A BROWNING    Staphylococcus aureus POSITIVE (A) NEGATIVE Final    Comment: (NOTE) The Xpert SA Assay (FDA approved for NASAL specimens in patients 32 years of age and older), is one component of a comprehensive surveillance program. It is not intended to diagnose infection nor to guide or monitor treatment. Performed at [facility], [place] [address], [place], [place] [address]   Aerobic/Anaerobic Culture (surgical/deep wound)     Status: None (Preliminary result)   Collection Time: 06/06/18  1:22 PM  Result Value Ref Range Status   Specimen Description ABSCESS LEFT TOE 2ND  Final   Special Requests NONE  Final   Gram Stain   Final    RARE WBC PRESENT, PREDOMINANTLY PMN FEW GRAM POSITIVE COCCI    Culture   Final    CULTURE REINCUBATED FOR BETTER GROWTH Performed at [facility], [place] [address] [place],  [address]    Report Status PENDING  Incomplete         Radiology Studies: No results  found.      Scheduled Meds: . atorvastatin  20 mg Oral QHS  . buPROPion  300 mg Oral QHS  . Chlorhexidine Gluconate Cloth  6 each Topical Q0600  . docusate sodium  100 mg Oral BID  . finasteride  5 mg Oral Daily  . insulin aspart  0-15 Units Subcutaneous TID WC  . ipratropium-albuterol  3 mL Nebulization Once  . ipratropium-albuterol  3 mL Nebulization TID  .  loratadine  10 mg Oral Daily  . metoprolol tartrate  25 mg Oral Daily  . morphine  15 mg Oral Q12H  . mupirocin ointment  1 application Nasal BID  . nicotine  14 mg Transdermal Daily  . PARoxetine  20 mg Oral QHS  . pregabalin  150 mg Oral BID  . senna-docusate  2 tablet Oral QHS  . tamsulosin  0.8 mg Oral QPC supper  . vitamin B-12  1,000 mcg Oral Daily   Continuous Infusions: . sodium chloride 75 mL/hr at 06/07/18 0557  . sodium chloride 10 mL/hr at 06/06/18 1217  . sodium chloride    . cefTRIAXone (ROCEPHIN)  IV 200 mL/hr at 06/06/18 1851  . methocarbamol (ROBAXIN) IV    . vancomycin 1,500 mg (06/07/18 1150)     LOS: 4 days    Time spent: 35 mins.More than 50% of that time was spent in counseling and/or coordination of care.      Shelly Coss, MD [facility] Pager [phone]  If 7PM-7AM, please contact night-coverage [email or web address] Password [facility] 06/07/2018, 2:57 PM

## 2018-06-07 NOTE — Plan of Care (Signed)
  Problem: Education: Goal: Knowledge of General Education information will improve Description: Including pain rating scale, medication(s)/side effects and non-pharmacologic comfort measures Outcome: Progressing   Problem: Clinical Measurements: Goal: Will remain free from infection Outcome: Progressing   Problem: Activity: Goal: Risk for activity intolerance will decrease Outcome: Progressing   Problem: Nutrition: Goal: Adequate nutrition will be maintained Outcome: Progressing   Problem: Coping: Goal: Level of anxiety will decrease Outcome: Progressing   Problem: Pain Managment: Goal: General experience of comfort will improve Outcome: Progressing   Problem: Safety: Goal: Ability to remain free from injury will improve Outcome: Progressing   Problem: Skin Integrity: Goal: Risk for impaired skin integrity will decrease Outcome: Progressing   

## 2018-06-07 NOTE — Progress Notes (Signed)
Patient ID: Gregory [name], male   DOB: 01-Oct-1960, 57 y.o.   MRN: [id number] Patient resting comfortably, post op day 1 amputation 1st and second ray left foot and debridement of abscess left forefoot, gram stain positive for gram positive cocci. No drainage in wound vac canister, VAC can remain in place for 1 week. I'll follow up in 1 week

## 2018-06-07 NOTE — Progress Notes (Signed)
PT Cancellation Note  Patient Details Name: Sonia Stickels MRN: [id number] DOB: 09-Oct-1960   Cancelled Treatment:    Reason Eval/Treat Not Completed: Pain limiting ability to participate  Patient declined 2nd to pain. Therapy returned later in the morning to coordinate with pain medication. Nursing can only give robaxin at this time. Patient declined stating he was not feeling good. Therapy will follow up in the afternoon.   Carney Living PT DPT  06/07/2018, 12:27 PM

## 2018-06-07 NOTE — Progress Notes (Addendum)
Pharmacy Antibiotic Note  Gregory [name] is a 57 y.o. male admitted on 06/03/2018 with medical history significant for osteomyelitis, epidural abscess, diabetes, CKD stage III, CHF, MRSA presenting from nursing facility for 72-hour history of left foot pain and drainage.  Pharmacy has been consulted for vancomycin and dosing for osteomyelitis/wound infection.  He is also on Rocephin per MD.  MRI showed cellulitis vs venous stasis.  Renal function continues to improve and appears to be at baseline.  Afebrile and WBC is improving.  Vanc dose not charted as given on 06/06/18.   Plan: Change vanc to 1500mg  IV Q24H for AUC 500 using SCr 1.82 CTX 2gm IV Q24H per MD Monitor renal fxn, micro data, vanc levels at Css   Height: 6' (182.9 cm) Weight: 254 lb 10.1 oz (115.5 kg) IBW/kg (Calculated) : 77.6  Temp (24hrs), Avg:99.4 F (37.4 C), Min:97.5 F (36.4 C), Max:100.7 F (38.2 C)  Recent Labs  Lab 06/03/18 1333 06/03/18 1341 06/04/18 0200 06/05/18 0243 06/06/18 0230 06/07/18 0413  WBC 14.9*  --  15.1* 14.2* 15.4* 14.4*  CREATININE 3.24* 3.30* 3.11* 2.57* 2.07* 1.82*  LATICACIDVEN  --  1.02  --   --   --   --     Estimated Creatinine Clearance: 57.4 mL/min (A) (by C-G formula based on SCr of 1.82 mg/dL (H)).    No Active Allergies   Vanc 12/17 >> Cefepime 12/17 >> 12/20 Flagyl 12/17 >> 12/20 CTX 12/20 >> Keflex PTA Azith PTA  12/17 surgical screen - MRSA/SA 12/17 BCx - NGTD 12/20 left 2nd toe abscess - GPC on Gram stain  Gregory [name] D. Mina Marble, PharmD, BCPS, [place] 06/07/2018, 9:56 AM

## 2018-06-08 LAB — BASIC METABOLIC PANEL
Anion gap: 12 (ref 5–15)
BUN: 29 mg/dL — AB (ref 6–20)
CO2: 16 mmol/L — ABNORMAL LOW (ref 22–32)
Calcium: 8.4 mg/dL — ABNORMAL LOW (ref 8.9–10.3)
Chloride: 111 mmol/L (ref 98–111)
Creatinine, Ser: 1.6 mg/dL — ABNORMAL HIGH (ref 0.61–1.24)
GFR calc Af Amer: 54 mL/min — ABNORMAL LOW (ref >60)
GFR calc non Af Amer: 46 mL/min — ABNORMAL LOW (ref >60)
Glucose, Bld: 137 mg/dL — ABNORMAL HIGH (ref 70–99)
Potassium: 5.4 mmol/L — ABNORMAL HIGH (ref 3.5–5.1)
SODIUM: 139 mmol/L (ref 135–145)

## 2018-06-08 LAB — GLUCOSE, CAPILLARY
GLUCOSE-CAPILLARY: 161 mg/dL — AB (ref 70–99)
Glucose-Capillary: 213 mg/dL — ABNORMAL HIGH (ref 70–99)
Glucose-Capillary: 122 mg/dL — ABNORMAL HIGH (ref 70–99)

## 2018-06-08 LAB — CBC WITH DIFFERENTIAL/PLATELET
Abs Immature Granulocytes: 0.44 10*3/uL — ABNORMAL HIGH (ref 0.00–0.07)
Basophils Absolute: 0.1 10*3/uL (ref 0.0–0.1)
Basophils Relative: 1 %
Eosinophils Absolute: 0.3 10*3/uL (ref 0.0–0.5)
Eosinophils Relative: 2 %
HCT: 29.3 % — ABNORMAL LOW (ref 39.0–52.0)
Hemoglobin: 9.1 g/dL — ABNORMAL LOW (ref 13.0–17.0)
Immature Granulocytes: 3 %
Lymphocytes Relative: 15 %
Lymphs Abs: 2.1 10*3/uL (ref 0.7–4.0)
MCH: 28.5 pg (ref 26.0–34.0)
MCHC: 31.1 g/dL (ref 30.0–36.0)
MCV: 91.8 fL (ref 80.0–100.0)
Monocytes Absolute: 1.1 10*3/uL — ABNORMAL HIGH (ref 0.1–1.0)
Monocytes Relative: 8 %
NEUTROS ABS: 10.1 10*3/uL — AB (ref 1.7–7.7)
Neutrophils Relative %: 71 %
Platelets: DECREASED 10*3/uL (ref 150–400)
RBC: 3.19 MIL/uL — AB (ref 4.22–5.81)
RDW: 16 % — ABNORMAL HIGH (ref 11.5–15.5)
WBC: 14.1 10*3/uL — AB (ref 4.0–10.5)
nRBC: 0 % (ref 0.0–0.2)

## 2018-06-08 LAB — CULTURE, BLOOD (ROUTINE X 2)
CULTURE: NO GROWTH
CULTURE: NO GROWTH
SPECIAL REQUESTS: ADEQUATE

## 2018-06-08 MED ORDER — ALUM & MAG HYDROXIDE-SIMETH 200-200-20 MG/5ML PO SUSP
30.0000 mL | Freq: Four times a day (QID) | ORAL | Status: DC | PRN
  Administered 2018-06-08: 30 mL via ORAL
  Filled 2018-06-08: qty 30

## 2018-06-08 MED ORDER — SODIUM POLYSTYRENE SULFONATE 15 GM/60ML PO SUSP
30.0000 g | Freq: Once | ORAL | Status: AC
  Administered 2018-06-08: 30 g via ORAL
  Filled 2018-06-08 (×2): qty 120

## 2018-06-08 MED ORDER — AMLODIPINE BESYLATE 5 MG PO TABS
5.0000 mg | ORAL_TABLET | Freq: Every day | ORAL | Status: DC
  Administered 2018-06-08 – 2018-06-09 (×2): 5 mg via ORAL
  Filled 2018-06-08 (×2): qty 1

## 2018-06-08 NOTE — Plan of Care (Signed)
  Problem: Education: Goal: Knowledge of General Education information will improve Description: Including pain rating scale, medication(s)/side effects and non-pharmacologic comfort measures Outcome: Progressing   Problem: Activity: Goal: Risk for activity intolerance will decrease Outcome: Progressing   Problem: Nutrition: Goal: Adequate nutrition will be maintained Outcome: Progressing   Problem: Elimination: Goal: Will not experience complications related to bowel motility Outcome: Progressing Goal: Will not experience complications related to urinary retention Outcome: Progressing   Problem: Pain Managment: Goal: General experience of comfort will improve Outcome: Progressing   Problem: Safety: Goal: Ability to remain free from injury will improve Outcome: Progressing   Problem: Skin Integrity: Goal: Risk for impaired skin integrity will decrease Outcome: Progressing   

## 2018-06-08 NOTE — Plan of Care (Signed)

## 2018-06-08 NOTE — Progress Notes (Addendum)
PROGRESS NOTE    Gregory [name]  VEH:[id number] DOB: 02-23-1961 DOA: 06/03/2018 PCP: [name], MD   Brief Narrative: Patient is a 57 year old male with past medical history of diabetes mellitus type 2, essential hypertension, chronic diastolic CHF, ureteral stricture status post recent cystoscopy and urethral dilation, chronic kidney disease stage III, right sided BKA who presented to the emergency department with complaints of pain, swelling, development of blebs/bulla on his left lower extremity.  He was on oral antibiotics as an outpatient but his symptoms got worse and he also started having fever.  Imagings done in the emergency department suggestive of osteomyelitis in the left foot.  ID and orthopedics consulted.Underwent  Amputation  of  left 2nd toe   by orthopedics on 06/06/18. Waiting for final culture and sensitivity report.  Assessment & Plan:   Principal Problem:   Acute osteomyelitis of left foot (HCC) Active Problems:   COPD (chronic obstructive pulmonary disease) ([place])   Sepsis with acute renal failure without septic shock (HCC)   Chronic diastolic CHF (congestive heart failure) (HCC)   Type II diabetes mellitus with neurological manifestations (HCC)   Chronic pain   Depression, recurrent (HCC)   Septic arthritis ([place])   Severe protein-calorie malnutrition ([place])   Cellulitis of left leg   Cutaneous abscess of left foot   Pressure injury of skin  Acute osteomyelitis/septic arthritis of the left foot/possible necrotizing fasciitis: X-ray showed: destructive changes of the proximal and middle phalanges of the left second digit suggesting osteomyelitis. Structural changes in the distal tip of the distal phalanx of the left second digit suggesting osteomyelitis Patient's left lower extremity was significantly swollen, tender and has bullae on admission.  There was suspicion  for necrotizing fasciitis but MRI just showed soft tissue edema/cellulitis.  Left lower  extremity swelling and erythema has been improving.  Ordered further MRI studies but patient denied. Continue vancomycin and ceftriaxone  for now as per ID.  Underwent amputation of second toe on 06/06/2018. Blood culture  NGTD.  Wound culture showing Streptococcus Canis.Waiting for sensitivity. Has  Mild  leukocytosis.  Acute kidney injury on chronic kidney disease stage III: Looked dehydrated on presentation.  His baseline creatinine runs around 1.5-2.  Hold nephrotoxic agents.  Diuretics on hold.    Creatinine improving.Fluids held.  HTN: To be hypertensive this morning.  Continue metoprolol at his home dose.  Added Norvasc 5 mg daily.  Diabetes mellitus type 2: On insulin at home.  Hemoglobin A1c of 8.  Continue sliding his insulin here.  Normocytic anemia:Anemia secondary to chronic disease.  No evidence of bleeding.  We will continue to monitor CBC.  Chronic diastolic CHF: Diuretics on hold.  Patient appeared hypovolemic on presentation.    Steroid use: He was started on prednisone about a month ago for unclear reason.  He was on 40 mg daily which has been cut down , tapered and stopped.  History of ureteral stricture: Recently underwent dilation of the ureteral stricture by Dr. Karsten Ro.  Has been urinating fine.  Continue Flomax  Chronic pain syndrome: Continue current pain regimen.  Tobacco abuse: Continue nicotine patch         DVT prophylaxis:Heparin [place] Code Status:Full  Family Communication: daughter on phone Disposition Plan: Pending final culture report.  He will be discharged to skilled nursing facility Final culture report and ID clearance   Consultants: Orthopedics, ID  Procedures: None  Antimicrobials: Vancomycin day 5 Ceftriaxone day 2  Subjective: Patient seen and examined the bedside today  in the morning.  He is hemodynamically stable at present.  No new complains. Objective: Vitals:   06/07/18 0602 06/07/18 1849 06/07/18 2116 06/08/18 0425  BP: (!)  166/74 (!) 150/76 (!) 143/75 (!) 164/83  Pulse: 92 (!) 55 62 81  Resp: 14  16 16   Temp: 100.3 F (37.9 C) 97.7 F (36.5 C) 97.8 F (36.6 C) 98.9 F (37.2 C)  TempSrc: Oral Oral Oral Oral  SpO2: 97% 96% 100% 98%  Weight:      Height:        Intake/Output Summary (Last 24 hours) at 06/08/2018 1113 Last data filed at 06/08/2018 9892 Gross per 24 hour  Intake 1884.5 ml  Output 2330 ml  Net -445.5 ml   Filed Weights   06/04/18 1247  Weight: 115.5 kg    Examination:  General exam: Not in distress,obese HEENT:PERRL,Oral mucosa moist, Ear/Nose normal on gross exam Respiratory system: Bilateral equal air entry, normal vesicular breath sounds, no wheezes or crackles  Cardiovascular system: S1 & S2 heard, RRR. No JVD, murmurs, rubs, gallops or clicks.  Gastrointestinal system: Abdomen is mildly distended, soft and nontender. No organomegaly or masses felt. Normal bowel sounds heard. Central nervous system: Alert and oriented. No focal neurological deficits. Extremities: Left lower extremity wrapped with dressing.  Right-sided BKA.Wound vac on left lower extremity Psychiatry: Judgement and insight appear normal. Mood & affect appropriate.     Data Reviewed: I have personally reviewed following labs and imaging studies  CBC: Recent Labs  Lab 06/03/18 1333  06/04/18 0200 06/05/18 0243 06/06/18 0230 06/07/18 0413 06/08/18 0314  WBC 14.9*  --  15.1* 14.2* 15.4* 14.4* 14.1*  NEUTROABS 13.0*  --   --  12.5* 12.5* 11.3* 10.1*  HGB 8.7*   < > 7.8* 8.3* 8.3* 7.8* 9.1*  HCT 28.6*   < > 25.7* 27.1* 27.2* 25.3* 29.3*  MCV 90.8  --  89.2 90.6 91.3 92.0 91.8  PLT 178  --  172 210 267 327 PLATELET CLUMPS NOTED ON SMEAR, COUNT APPEARS DECREASED   < > = values in this interval not displayed.   Basic Metabolic Panel: Recent Labs  Lab 06/04/18 0200 06/05/18 0243 06/06/18 0230 06/07/18 0413 06/08/18 0314  NA 138 141 142 139 139  K 4.4 4.2 4.6 4.4 5.4*  CL 104 108 111 110 111  CO2  20* 20* 20* 19* 16*  GLUCOSE 128* 76 142* 198* 137*  BUN 61* 56* 47* 37* 29*  CREATININE 3.11* 2.57* 2.07* 1.82* 1.60*  CALCIUM 8.5* 8.4* 8.6* 8.3* 8.4*   GFR: Estimated Creatinine Clearance: 65.3 mL/min (A) (by C-G formula based on SCr of 1.6 mg/dL (H)). Liver Function Tests: Recent Labs  Lab 06/03/18 1333 06/04/18 0200  AST 15 14*  ALT 13 13  ALKPHOS 114 107  BILITOT 0.9 0.9  PROT 6.6 6.5  ALBUMIN 2.8* 2.2*   No results for input(s): LIPASE, AMYLASE in the last 168 hours. No results for input(s): AMMONIA in the last 168 hours. Coagulation Profile: Recent Labs  Lab 06/04/18 0200  INR 1.18   Cardiac Enzymes: No results for input(s): CKTOTAL, CKMB, CKMBINDEX, TROPONINI in the last 168 hours. BNP (last 3 results) No results for input(s): PROBNP in the last 8760 hours. HbA1C: No results for input(s): HGBA1C in the last 72 hours. CBG: Recent Labs  Lab 06/07/18 0754 06/07/18 1157 06/07/18 1701 06/07/18 2113 06/08/18 0839  GLUCAP 204* 167* 165* 126* 161*   Lipid Profile: No results for input(s): CHOL, HDL, LDLCALC, TRIG,  CHOLHDL, LDLDIRECT in the last 72 hours. Thyroid Function Tests: No results for input(s): TSH, T4TOTAL, FREET4, T3FREE, THYROIDAB in the last 72 hours. Anemia Panel: No results for input(s): VITAMINB12, FOLATE, FERRITIN, TIBC, IRON, RETICCTPCT in the last 72 hours. Sepsis Labs: Recent Labs  Lab 06/03/18 1341  LATICACIDVEN 1.02    Recent Results (from the past 240 hour(s))  Blood culture (routine x 2)     Status: None   Collection Time: 06/03/18  1:32 PM  Result Value Ref Range Status   Specimen Description   Final    BLOOD RIGHT ANTECUBITAL Performed at [facility] [address], [place], [place] [address]    Special Requests   Final    BOTTLES DRAWN AEROBIC AND ANAEROBIC Blood Culture results may not be optimal due to an excessive volume of blood received in culture bottles Performed at [facility] [address], [place], [place] [address]    Culture   Final    NO GROWTH 5 DAYS Performed at [facility], [place] [address] [place], [place] [address]    Report Status 06/08/2018 FINAL  Final  Blood culture (routine x 2)     Status: None   Collection Time: 06/03/18  1:45 PM  Result Value Ref Range Status   Specimen Description   Final    BLOOD LEFT WRIST Performed at [facility], [place] [address], [place], [place] [address]    Special Requests   Final    BOTTLES DRAWN AEROBIC AND ANAEROBIC Blood Culture adequate volume Performed at [facility] [address], [place], [place] [address]    Culture   Final    NO GROWTH 5 DAYS Performed at [facility], [place] [address], [place], [place] [address]    Report Status 06/08/2018 FINAL  Final  Surgical pcr screen     Status: Abnormal   Collection Time: 06/03/18 10:51 PM  Result Value Ref Range Status   MRSA, PCR POSITIVE (A) NEGATIVE Final    Comment: RESULT CALLED TO, READ BACK BY AND VERIFIED WITHBeulah Gandy RN [phone] 06/04/18 A BROWNING    Staphylococcus aureus POSITIVE (A) NEGATIVE Final    Comment: (NOTE) The Xpert SA Assay (FDA approved for NASAL specimens in patients 30 years of age and older), is one component of a comprehensive surveillance program. It is not intended to diagnose infection nor to guide or monitor treatment. Performed at [facility], [place] [address], [place], [place] [address]   Aerobic/Anaerobic Culture (surgical/deep wound)     Status: None (Preliminary result)   Collection Time: 06/06/18  1:22 PM  Result Value Ref Range Status   Specimen Description ABSCESS LEFT TOE 2ND  Final   Special Requests NONE  Final   Gram Stain   Final    RARE WBC PRESENT, PREDOMINANTLY PMN FEW GRAM POSITIVE COCCI Performed at [facility], [place] [address], [place], [place] [address]    Culture   Final    MODERATE STREPTOCOCCUS CANIS NO ANAEROBES ISOLATED; CULTURE  IN PROGRESS FOR 5 DAYS    Report Status PENDING  Incomplete         Radiology Studies: No results found.      Scheduled Meds: . atorvastatin  20 mg Oral QHS  . buPROPion  300 mg Oral QHS  . docusate sodium  100 mg Oral BID  . finasteride  5 mg Oral Daily  . heparin injection (subcutaneous)  5,000 Units Subcutaneous Q8H  . insulin aspart  0-15 Units Subcutaneous TID WC  . ipratropium-albuterol  3 mL Nebulization Once  . loratadine  10 mg Oral Daily  . metoprolol tartrate  25 mg Oral Daily  . morphine  15 mg Oral Q12H  . mupirocin ointment  1 application Nasal BID  . nicotine  14 mg Transdermal Daily  . PARoxetine  20 mg Oral QHS  . pregabalin  150 mg Oral BID  . senna-docusate  2 tablet Oral QHS  . sodium polystyrene  30 g Oral Once  . tamsulosin  0.8 mg Oral QPC supper  . vitamin B-12  1,000 mcg Oral Daily   Continuous Infusions: . sodium chloride 10 mL/hr at 06/06/18 1217  . sodium chloride    . cefTRIAXone (ROCEPHIN)  IV 2 g (06/07/18 1744)  . methocarbamol (ROBAXIN) IV    . vancomycin 1,500 mg (06/08/18 1023)     LOS: 5 days    Time spent: 35 mins.More than 50% of that time was spent in counseling and/or coordination of care.      Shelly Coss, MD [facility] Pager [phone]  If 7PM-7AM, please contact night-coverage [email or web address] Password [email or web address] 06/08/2018, 11:13 AM

## 2018-06-08 NOTE — Evaluation (Signed)
Physical Therapy Evaluation Patient Details Name: Gregory [name] MRN: [id number] DOB: 01-May-1961 Today's Date: 06/08/2018   History of Present Illness  Patient is a 57 y/o male who presents with LLE pain. Found to have sepsis secondary to osteomyelitis of left foot. s/p 1st and 2nd toe amputations 12/20. + for gram + cocci. PMH includes Rt BKA, cervical surgery, HTN, CKD, CHF, COPD, HLD, bil foot drop.  Clinical Impression  Patient presents with lethargy, pain and impaired mobility s/p above. Tolerated long sitting and A-P transfer to chair with Mod A of 2. Pt from SNF and plans to return there. Reports being independent with transfers to w/c and some assist needed for ADls at baseline. Encouraged up in chair for all meals. Educated pt on importance of weight shifting and adjusting in chair/bed to prevent sores and WB status of LLE. Recommend A-P transfer back to bed with nursing. Will follow acutely to maximize independence and mobility prior to return to SNF.    Follow Up Recommendations SNF;Supervision for mobility/OOB    Equipment Recommendations  None recommended by PT    Recommendations for Other Services       Precautions / Restrictions Precautions Precautions: Fall Precaution Comments: prior rt BKA; wound vac Restrictions Weight Bearing Restrictions: Yes LLE Weight Bearing: Touchdown weight bearing Other Position/Activity Restrictions: only for transfers.      Mobility  Bed Mobility Overal bed mobility: Needs Assistance Bed Mobility: Supine to Sit     Supine to sit: Mod assist;HOB elevated;+2 for safety/equipment     General bed mobility comments: Able to get to long sitting with Mod A for support, impulsive initially.  Transfers Overall transfer level: Needs assistance Equipment used: None Transfers: Comptroller transfers: Mod assist;+2 physical assistance   General transfer comment: Performed A-P transfer to recliner  with cues for technique and assist to scoot bottom back into chair.   Ambulation/Gait             General Gait Details: NT as pt is non ambulatory  Stairs            Wheelchair Mobility    Modified Rankin (Stroke Patients Only)       Balance Overall balance assessment: Mild deficits observed, not formally tested                                           Pertinent Vitals/Pain Pain Assessment: Faces Faces Pain Scale: Hurts even more Pain Location: LLE Pain Descriptors / Indicators: Sore;Aching;Operative site guarding Pain Intervention(s): Monitored during session;Repositioned;Premedicated before session    Home Living Family/patient expects to be discharged to:: Skilled nursing facility                 Additional Comments: Resides at [facility]    Prior Function Level of Independence: Needs assistance   Gait / Transfers Assistance Needed: Transfers independently per report and uses w/c for mobility.   ADL's / Homemaking Assistance Needed: Reports can dress/bathe self- goes into the shower        Hand Dominance   Dominant Hand: Right    Extremity/Trunk Assessment   Upper Extremity Assessment Upper Extremity Assessment: Defer to OT evaluation    Lower Extremity Assessment Lower Extremity Assessment: RLE deficits/detail;LLE deficits/detail RLE Deficits / Details: Rt BKA- able to lift against gravity with no issues LLE Deficits / Details: No  ankle AROM- premorbid hx of foot drop; painful to move       Communication   Communication: HOH  Cognition Arousal/Alertness: Lethargic;Suspect due to medications Behavior During Therapy: Advanced Pain Management for tasks assessed/performed Overall Cognitive Status: No family/caregiver present to determine baseline cognitive functioning                                 General Comments: Seems WFL for basic mobility tasks.      General Comments      Exercises General Exercises - Lower  Extremity Straight Leg Raises: Both;5 reps;Supine   Assessment/Plan    PT Assessment Patient needs continued PT services  PT Problem List Decreased strength;Decreased mobility;Decreased skin integrity;Pain;Decreased balance;Decreased activity tolerance       PT Treatment Interventions Balance training;Patient/family education;Wheelchair mobility training;Therapeutic activities;Therapeutic exercise;DME instruction;Functional mobility training    PT Goals (Current goals can be found in the Care Plan section)  Acute Rehab PT Goals Patient Stated Goal: make this pain better and work with PT at SNF PT Goal Formulation: With patient Time For Goal Achievement: 06/22/18 Potential to Achieve Goals: Good    Frequency Min 2X/week   Barriers to discharge        Co-evaluation               AM-PAC PT "6 Clicks" Mobility  Outcome Measure Help needed turning from your back to your side while in a flat bed without using bedrails?: A Little Help needed moving from lying on your back to sitting on the side of a flat bed without using bedrails?: A Lot Help needed moving to and from a bed to a chair (including a wheelchair)?: A Lot Help needed standing up from a chair using your arms (e.g., wheelchair or bedside chair)?: Total Help needed to walk in hospital room?: Total Help needed climbing 3-5 steps with a railing? : Total 6 Click Score: 10    End of Session   Activity Tolerance: Patient tolerated treatment well Patient left: in chair;with call bell/phone within reach;with chair alarm set;with nursing/sitter in room Nurse Communication: Mobility status;Other (comment)(transfer technique) PT Visit Diagnosis: Pain;Muscle weakness (generalized) (M62.81) Pain - Right/Left: Left Pain - part of body: Ankle and joints of foot    Time: 9983-3825 PT Time Calculation (min) (ACUTE ONLY): 20 min   Charges:   PT Evaluation $PT Eval Moderate Complexity: 1 Mod          Wray Kearns, PT, DPT Acute Rehabilitation Services Pager [phone] Office [phone]      Marguarite Arbour A [name] Heck 06/08/2018, 9:23 AM

## 2018-06-09 ENCOUNTER — Inpatient Hospital Stay: Payer: Self-pay

## 2018-06-09 DIAGNOSIS — B954 Other streptococcus as the cause of diseases classified elsewhere: Secondary | ICD-10-CM

## 2018-06-09 DIAGNOSIS — A491 Streptococcal infection, unspecified site: Secondary | ICD-10-CM

## 2018-06-09 DIAGNOSIS — Z89422 Acquired absence of other left toe(s): Secondary | ICD-10-CM

## 2018-06-09 DIAGNOSIS — Z978 Presence of other specified devices: Secondary | ICD-10-CM

## 2018-06-09 LAB — CBC WITH DIFFERENTIAL/PLATELET
Abs Immature Granulocytes: 0.39 10*3/uL — ABNORMAL HIGH (ref 0.00–0.07)
BASOS PCT: 0 %
Basophils Absolute: 0 10*3/uL (ref 0.0–0.1)
Eosinophils Absolute: 0.3 10*3/uL (ref 0.0–0.5)
Eosinophils Relative: 2 %
HCT: 28.3 % — ABNORMAL LOW (ref 39.0–52.0)
Hemoglobin: 8.2 g/dL — ABNORMAL LOW (ref 13.0–17.0)
Immature Granulocytes: 3 %
Lymphocytes Relative: 14 %
Lymphs Abs: 1.9 10*3/uL (ref 0.7–4.0)
MCH: 26.7 pg (ref 26.0–34.0)
MCHC: 29 g/dL — ABNORMAL LOW (ref 30.0–36.0)
MCV: 92.2 fL (ref 80.0–100.0)
MONOS PCT: 6 %
Monocytes Absolute: 0.8 10*3/uL (ref 0.1–1.0)
Neutro Abs: 10.2 10*3/uL — ABNORMAL HIGH (ref 1.7–7.7)
Neutrophils Relative %: 75 %
Platelets: 353 10*3/uL (ref 150–400)
RBC: 3.07 MIL/uL — ABNORMAL LOW (ref 4.22–5.81)
RDW: 15.8 % — ABNORMAL HIGH (ref 11.5–15.5)
WBC: 13.6 10*3/uL — ABNORMAL HIGH (ref 4.0–10.5)
nRBC: 0 % (ref 0.0–0.2)

## 2018-06-09 LAB — BASIC METABOLIC PANEL
Anion gap: 7 (ref 5–15)
BUN: 18 mg/dL (ref 6–20)
CO2: 25 mmol/L (ref 22–32)
Calcium: 8.3 mg/dL — ABNORMAL LOW (ref 8.9–10.3)
Chloride: 107 mmol/L (ref 98–111)
Creatinine, Ser: 1.46 mg/dL — ABNORMAL HIGH (ref 0.61–1.24)
GFR calc Af Amer: >60 mL/min (ref >60)
GFR, EST NON AFRICAN AMERICAN: 52 mL/min — AB (ref >60)
Glucose, Bld: 162 mg/dL — ABNORMAL HIGH (ref 70–99)
Potassium: 3.7 mmol/L (ref 3.5–5.1)
Sodium: 139 mmol/L (ref 135–145)

## 2018-06-09 LAB — GLUCOSE, CAPILLARY: Glucose-Capillary: 135 mg/dL — ABNORMAL HIGH (ref 70–99)

## 2018-06-09 MED ORDER — POLYETHYLENE GLYCOL 3350 17 G PO PACK: 17.0000 g | PACK | Freq: Every day | ORAL | 0 refills | Status: DC | PRN

## 2018-06-09 MED ORDER — AMOXICILLIN-POT CLAVULANATE 875-125 MG PO TABS
1.0000 | ORAL_TABLET | Freq: Two times a day (BID) | ORAL | Status: DC
  Administered 2018-06-09: 1 via ORAL
  Filled 2018-06-09: qty 1

## 2018-06-09 MED ORDER — AMOXICILLIN-POT CLAVULANATE 875-125 MG PO TABS: 1.0000 | ORAL_TABLET | Freq: Two times a day (BID) | ORAL | 0 refills | Status: DC

## 2018-06-09 MED ORDER — AMLODIPINE BESYLATE 5 MG PO TABS: 5.0000 mg | ORAL_TABLET | Freq: Every day | ORAL | Status: DC

## 2018-06-09 NOTE — Discharge Summary (Signed)
Physician Discharge Summary  Gregory [name] TKZ:[id number] DOB: 07/25/1960 DOA: 06/03/2018  PCP: [name], MD  Admit date: 06/03/2018 Discharge date: 06/09/2018  Admitted From: SNF Disposition:  SNF  Discharge Condition:Stable CODE STATUS:FULL Diet recommendation: Heart Healthy   Brief/Interim Summary:  Patient is a 57 year old male with past medical history of diabetes mellitus type 2, essential hypertension, chronic diastolic CHF, ureteral stricture status post recent cystoscopy and urethral dilation, chronic kidney disease stage III, right sided BKA who presented to the emergency department with complaints of pain, swelling, development of blebs/bulla on his left lower extremity.  He was on oral antibiotics as an outpatient but his symptoms got worse and he also started having fever.  Imagings done in the emergency department suggestive of osteomyelitis in the left foot.  ID and orthopedics consulted.Underwent  Amputation  of  left 2nd toe   by orthopedics on 06/06/18.  Since then, he has remained hemodynamically stable.  His wound culture showed Streptococcus Canis.  He has been started on Augmentin that will be continued for 6 weeks.  Following problems were addressed during his hospitalization:  Acute osteomyelitis/septic arthritis of the left foot/possible necrotizing fasciitis: X-ray on admission showed: destructive changes of the proximal and middle phalanges of the left second digit suggesting osteomyelitis. Structural changes in the distal tip of the distal phalanx of the left second digit suggesting osteomyelitis Patient's left lower extremity was significantly swollen, tender and has bullae on admission.  There was suspicion  for necrotizing fasciitis but MRI just showed soft tissue edema/cellulitis.  Left lower extremity swelling and erythema has been improving.  Ordered further MRI studies but patient denied. He was started on  vancomycin and ceftriaxone   as per ID.   Underwent amputation of second toe on 06/06/2018. Blood culture  NGTD.  Wound culture showing streptococcus Canis.plan is to continue Augmentin for total of 6 weeks as per ID.  He will follow-up with ID as an outpatient. He will also follow-up with orthopedics as an outpatient.  He will be discharged with wound VAC.   Acute kidney injury on chronic kidney disease stage III: Looked dehydrated on presentation.  His baseline creatinine runs around 1.5-2.  Hold nephrotoxic agents.   Creatinine improving.Fluids held.  HTN:  Continue metoprolol at his home dose.  Added Norvasc 5 mg daily.  Diabetes mellitus type 2: Continue previous regimen on discharge.  Normocytic anemia:Anemia secondary to chronic disease.  No evidence of bleeding.  Check CBC periodically.  Chronic diastolic CHF: Diuretics was on hold.  Patient appeared hypovolemic on presentation.  He can resume Lasix 20 mg daily on discharge.  Steroid use: He was started on prednisone about a month ago for unclear reason.  He was on 40 mg daily which has been cut down , tapered and stopped.  History of ureteral stricture: Recently underwent dilation of the ureteral stricture by Dr. Karsten Ro.  Has been urinating fine.  Continue Flomax  Chronic pain syndrome: Continue current pain regimen.  Tobacco abuse: Continue nicotine patch   Discharge Diagnoses:  Principal Problem:   Acute osteomyelitis of left foot ([place]) Active Problems:   COPD (chronic obstructive pulmonary disease) (HCC)   Sepsis with acute renal failure without septic shock (HCC)   Chronic diastolic CHF (congestive heart failure) (HCC)   Type II diabetes mellitus with neurological manifestations (HCC)   Chronic pain   Depression, recurrent (HCC)   Septic arthritis ([place])   Severe protein-calorie malnutrition (HCC)   Cellulitis of left leg   Cutaneous  abscess of left foot   Pressure injury of skin    Discharge Instructions  Discharge Instructions    Diet - low  sodium heart healthy   Complete by:  As directed    Discharge instructions   Complete by:  As directed    1) Please follow up with Orthopedics as an outpatient.  Name and number of the provider group has been attached 2)Do CBC and BMP tests in a week. 3) Take prescribed medications as instructed.   Increase activity slowly   Complete by:  As directed    Negative Pressure Wound Therapy - Incisional   Complete by:  As directed    Disconnect hospital VAC and attach to prevena plus portable VAC pump.     Allergies as of 06/09/2018   No Active Allergies     Medication List    STOP taking these medications   azithromycin 250 MG tablet Commonly known as:  ZITHROMAX   cephALEXin 500 MG capsule Commonly known as:  KEFLEX   POLY BACITRACIN 500-10000 UNIT/GM Oint   predniSONE 20 MG tablet Commonly known as:  DELTASONE     TAKE these medications   acetaminophen 325 MG tablet Commonly known as:  TYLENOL Take 650 mg by mouth every 4 (four) hours as needed for moderate pain.   albuterol 108 (90 Base) MCG/ACT inhaler Commonly known as:  PROVENTIL HFA;VENTOLIN HFA Inhale 2 puffs into the lungs every 6 (six) hours as needed for wheezing or shortness of breath.   amLODipine 5 MG tablet Commonly known as:  NORVASC Take 1 tablet (5 mg total) by mouth daily. Start taking on:  June 10, 2018   amoxicillin-clavulanate 875-125 MG tablet Commonly known as:  AUGMENTIN Take 1 tablet by mouth every 12 (twelve) hours.   antiseptic oral rinse Liqd 15 mLs by Mouth Rinse route every 4 (four) hours as needed for dry mouth.   atorvastatin 20 MG tablet Commonly known as:  LIPITOR Take 20 mg by mouth at bedtime.   bisacodyl 10 MG suppository Commonly known as:  DULCOLAX Place 1 suppository (10 mg total) rectally daily as needed for moderate constipation.   buPROPion 300 MG 24 hr tablet Commonly known as:  WELLBUTRIN XL Take 300 mg by mouth at bedtime.   calcium carbonate 500 MG  chewable tablet Commonly known as:  TUMS - dosed in mg elemental calcium Chew 2 tablets by mouth every 8 (eight) hours as needed for indigestion or heartburn.   cetirizine 10 MG tablet Commonly known as:  ZYRTEC Take 10 mg by mouth daily.   diphenhydrAMINE 25 MG tablet Commonly known as:  BENADRYL Take 1 tablet (25 mg total) by mouth every 6 (six) hours.   eszopiclone 2 MG Tabs tablet Commonly known as:  LUNESTA Take 2 mg by mouth at bedtime. Take immediately before bedtime   finasteride 5 MG tablet Commonly known as:  PROSCAR Take 5 mg by mouth daily.   furosemide 20 MG tablet Commonly known as:  LASIX Take 20 mg by mouth daily.   gemfibrozil 600 MG tablet Commonly known as:  LOPID Take 600 mg by mouth every 12 (twelve) hours.   glipiZIDE 10 MG tablet Commonly known as:  GLUCOTROL Take 10 mg by mouth daily before breakfast.   GLUCAGON EMERGENCY 1 MG injection Generic drug:  glucagon Inject 1 mg into the muscle once as needed (for CBG <70).   guaiFENesin 600 MG 12 hr tablet Commonly known as:  MUCINEX Take 600 mg by mouth  every 12 (twelve) hours as needed for cough (congestion). What changed:  Another medication with the same name was removed. Continue taking this medication, and follow the directions you see here.   HUMALOG KWIKPEN 100 UNIT/ML KwikPen Generic drug:  insulin lispro Inject 0-10 Units into the skin See admin instructions. Inject per sliding scale 3 times daily before meals; CBG <60 = notify MD, 0-150 = 0 units, 151-200 = 2 units, 201-250 = 4 units, 251-300 = 6 units, 301-350 = 8 units, 351-400 = 10 units, >400 = notify MD   ipratropium-albuterol 0.5-2.5 (3) MG/3ML Soln Commonly known as:  DUONEB Take 3 mLs by nebulization every 6 (six) hours as needed (congestion/SOB). What changed:  Another medication with the same name was removed. Continue taking this medication, and follow the directions you see here.   lidocaine 5 % Commonly known as:   LIDODERM Place 1 patch onto the skin daily. Remove & Discard patch within 12 hours or as directed by MD   loratadine 10 MG tablet Commonly known as:  CLARITIN Take 10 mg by mouth daily.   magnesium oxide 400 MG tablet Commonly known as:  MAG-OX Take 400 mg by mouth 2 (two) times daily.   metoprolol tartrate 25 MG tablet Commonly known as:  LOPRESSOR Take 25 mg by mouth daily.   morphine 15 MG 12 hr tablet Commonly known as:  MS CONTIN Take 15 mg by mouth every 12 (twelve) hours.   NASONEX 50 MCG/ACT nasal spray Generic drug:  mometasone Place 1 spray into the nose daily.   omeprazole 20 MG capsule Commonly known as:  PRILOSEC Take 40 mg by mouth daily.   ondansetron 4 MG tablet Commonly known as:  ZOFRAN Take 4 mg by mouth every 6 (six) hours as needed for nausea or vomiting.   oxybutynin 15 MG 24 hr tablet Commonly known as:  DITROPAN XL Take 15 mg by mouth at bedtime.   oxyCODONE 15 MG immediate release tablet Commonly known as:  ROXICODONE Take 15 mg by mouth every 6 (six) hours as needed for pain.   PARoxetine 20 MG tablet Commonly known as:  PAXIL Take 20 mg by mouth at bedtime.   polyethylene glycol packet Commonly known as:  MIRALAX / GLYCOLAX Take 17 g by mouth daily as needed for mild constipation.   pregabalin 150 MG capsule Commonly known as:  LYRICA Take 150 mg by mouth 2 (two) times daily.   senna-docusate 8.6-50 MG tablet Commonly known as:  Senokot-S Take 2 tablets by mouth at bedtime.   tamsulosin 0.4 MG Caps capsule Commonly known as:  FLOMAX Take 0.8 mg by mouth every evening.   tiZANidine 2 MG tablet Commonly known as:  ZANAFLEX Take 2 mg by mouth every 8 (eight) hours as needed for muscle spasms.   vitamin B-12 1000 MCG tablet Commonly known as:  CYANOCOBALAMIN Take 1,000 mcg by mouth daily.   Vitamin D-3 125 MCG (5000 UT) Tabs Take 5,000 Units by mouth daily.       Contact information for follow-up providers    [name], MD In 1 week.   Specialty:  Orthopedic Surgery Contact information: [place] [address] [phone]            Contact information for after-discharge care    Destination    [facility] .   Service:  Skilled Chiropodist information: [address] [place] [phone]  No Active Allergies  Consultations:  ID, orthopedics   Procedures/Studies: Dg Chest 2 View  Result Date: 06/03/2018 CLINICAL DATA:  [place] EXAM: CHEST - 2 VIEW COMPARISON:  11/26/2015 FINDINGS: Generalized interstitial coarsening without Kerley lines or effusion. Heart size is normal. No collapse or consolidation. IMPRESSION: Bronchitic markings without focal pneumonia. Electronically Signed   By: Monte Fantasia M.D.   On: 06/03/2018 14:47   Dg Tibia/fibula Left  Result Date: 06/03/2018 CLINICAL DATA:  Worsening left leg infection x3 days. EXAM: LEFT TIBIA AND FIBULA - 2 VIEW COMPARISON:  None. FINDINGS: Overlapping AP and lateral views of the left tibia and fibula are provided. Soft tissue nodularity is seen along the lateral aspect of the knee and leg with moderate soft tissue swelling noted about the ankle. No acute fracture, bone destruction or suspicious osseous lesions. Osteoarthritic joint space narrowing of the medial femorotibial compartment with spurring of the tibial spine is identified. No joint effusion is seen. IMPRESSION: 1. Soft tissue nodularity along the lateral aspect of the knee and leg with moderate soft tissue swelling about the ankle. 2. 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 3. No acute osseous abnormality. No evidence of bone destruction/osteomyelitis. Electronically Signed   By: Ashley Royalty M.D.   On: 06/03/2018 14:52   Mr Femur Left Wo Contrast  Result Date: 06/04/2018 CLINICAL DATA:  Erythema and edema of the left thigh EXAM: MR OF THE LEFT FEMUR  WITHOUT CONTRAST TECHNIQUE: Multiplanar, multisequence MR imaging of the left femur was performed. No intravenous contrast was administered. COMPARISON:  None. FINDINGS: Bones/Joint/Cartilage No marrow signal abnormality. No fracture or dislocation. Normal alignment. No joint effusion. No periosteal reaction or bone destruction. No aggressive osseous lesion. Ligaments, Muscles and Tendons Muscles are normal. No muscle atrophy. No intramuscular fluid collection or hematoma. Soft tissue No fluid collection or hematoma. No soft tissue mass. Generalized mild soft tissue edema in the subcutaneous fat surrounding the left thigh. IMPRESSION: 1. Generalized mild soft tissue edema in the subcutaneous fat surrounding the left thigh. Differential and considerations include cellulitis versus venous stasis. Electronically Signed   By: Kathreen Devoid   On: 06/04/2018 13:33   Dg Foot Complete Left  Result Date: 06/03/2018 CLINICAL DATA:  Worsening infection. EXAM: LEFT FOOT - COMPLETE 3+ VIEW COMPARISON:  MRI 03/26/2017.  Left foot series 10/02/2016. FINDINGS: Prior amputation left first MTP. Destructive changes noted of the distal aspect of the proximal phalanx and proximal aspect of the middle phalanx of the left second digit. Destructive changes noted of the distal portion of the distal phalanx of the left second digit. These findings are suggestive of osteomyelitis. No other focal bony abnormality identified. IMPRESSION: 1. Destructive changes of the proximal and middle phalanges of the left second digit suggesting osteomyelitis. Septic arthritis at the proximal interphalangeal joint space may be present. 2. Structures changes in the distal tip of the distal phalanx of the left second digit suggesting osteomyelitis. Electronically Signed   By: Marcello Moores  Register   On: 06/03/2018 14:50   [place] Ekg Site Rite  Result Date: 06/09/2018 If Site Rite image not attached, placement could not be confirmed due to current cardiac  rhythm.      Subjective:  Patient seen and examined the bedside this morning.  Remains comfortable.  Hemodynamically stable.  Stable for discharge today to skilled nursing facility. Discharge Exam: Vitals:   06/08/18 1256 06/09/18 0555  BP: (!) 170/77 (!) 159/76  Pulse: 72 69  Resp: 16 16  Temp:  98.4 F (36.9 C) 98.7 F (37.1 C)  SpO2: 98% 100%   Vitals:   06/08/18 0425 06/08/18 1016 06/08/18 1256 06/09/18 0555  BP: (!) 164/83 113/75 (!) 170/77 (!) 159/76  Pulse: 81 (!) 101 72 69  Resp: 16  16 16   Temp: 98.9 F (37.2 C) 98.4 F (36.9 C) 98.4 F (36.9 C) 98.7 F (37.1 C)  TempSrc: Oral Oral Oral Oral  SpO2: 98% 100% 98% 100%  Weight:      Height:        General: Pt is alert, awake, not in acute distress Cardiovascular: RRR, S1/S2 +, no rubs, no gallops Respiratory: CTA bilaterally, no wheezing, no rhonchi Abdominal: Soft, NT, ND, bowel sounds + Extremities: Right BKA, left lower extremity wound covered with dressing, wound VAC, improving erythema and edema    The results of significant diagnostics from this hospitalization (including imaging, microbiology, ancillary and laboratory) are listed below for reference.     Microbiology: Recent Results (from the past 240 hour(s))  Blood culture (routine x 2)     Status: None   Collection Time: 06/03/18  1:32 PM  Result Value Ref Range Status   Specimen Description   Final    BLOOD RIGHT ANTECUBITAL Performed at [facility] [address] [place], [place] [address]    Special Requests   Final    BOTTLES DRAWN AEROBIC AND ANAEROBIC Blood Culture results may not be optimal due to an excessive volume of blood received in culture bottles Performed at [facility] [address] [place], [place] [address]    Culture   Final    NO GROWTH 5 DAYS Performed at [facility], [place] [address], [place], [place] [address]    Report Status 06/08/2018 FINAL  Final  Blood culture  (routine x 2)     Status: None   Collection Time: 06/03/18  1:45 PM  Result Value Ref Range Status   Specimen Description   Final    BLOOD LEFT WRIST Performed at [facility], [place] [address], [place], [place] [address]    Special Requests   Final    BOTTLES DRAWN AEROBIC AND ANAEROBIC Blood Culture adequate volume Performed at [facility] [address] [place], [place] [address]    Culture   Final    NO GROWTH 5 DAYS Performed at [facility], [place] [address] [place], [place] [address]    Report Status 06/08/2018 FINAL  Final  Surgical pcr screen     Status: Abnormal   Collection Time: 06/03/18 10:51 PM  Result Value Ref Range Status   MRSA, PCR POSITIVE (A) NEGATIVE Final    Comment: RESULT CALLED TO, READ BACK BY AND VERIFIED WITHBeulah Gandy RN [phone] 06/04/18 A BROWNING    Staphylococcus aureus POSITIVE (A) NEGATIVE Final    Comment: (NOTE) The Xpert SA Assay (FDA approved for NASAL specimens in patients 42 years of age and older), is one component of a comprehensive surveillance program. It is not intended to diagnose infection nor to guide or monitor treatment. Performed at [facility], [place] [address], [place], [place] [address]   Aerobic/Anaerobic Culture (surgical/deep wound)     Status: None (Preliminary result)   Collection Time: 06/06/18  1:22 PM  Result Value Ref Range Status   Specimen Description ABSCESS LEFT TOE 2ND  Final   Special Requests NONE  Final   Gram Stain   Final  RARE WBC PRESENT, PREDOMINANTLY PMN FEW GRAM POSITIVE COCCI Performed at [facility] [address] [place], [place] [address]    Culture   Final    MODERATE STREPTOCOCCUS CANIS NO ANAEROBES ISOLATED; CULTURE IN PROGRESS FOR 5 DAYS    Report Status PENDING  Incomplete     Labs: BNP (last 3 results) No results for input(s): BNP in the last 8760 hours. Basic Metabolic Panel: Recent Labs  Lab 06/05/18 0243 06/06/18 0230  06/07/18 0413 06/08/18 0314 06/09/18 0655  NA 141 142 139 139 139  K 4.2 4.6 4.4 5.4* 3.7  CL 108 111 110 111 107  CO2 20* 20* 19* 16* 25  GLUCOSE 76 142* 198* 137* 162*  BUN 56* 47* 37* 29* 18  CREATININE 2.57* 2.07* 1.82* 1.60* 1.46*  CALCIUM 8.4* 8.6* 8.3* 8.4* 8.3*   Liver Function Tests: Recent Labs  Lab 06/03/18 1333 06/04/18 0200  AST 15 14*  ALT 13 13  ALKPHOS 114 107  BILITOT 0.9 0.9  PROT 6.6 6.5  ALBUMIN 2.8* 2.2*   No results for input(s): LIPASE, AMYLASE in the last 168 hours. No results for input(s): AMMONIA in the last 168 hours. CBC: Recent Labs  Lab 06/05/18 0243 06/06/18 0230 06/07/18 0413 06/08/18 0314 06/09/18 0655  WBC 14.2* 15.4* 14.4* 14.1* 13.6*  NEUTROABS 12.5* 12.5* 11.3* 10.1* 10.2*  HGB 8.3* 8.3* 7.8* 9.1* 8.2*  HCT 27.1* 27.2* 25.3* 29.3* 28.3*  MCV 90.6 91.3 92.0 91.8 92.2  PLT 210 267 327 PLATELET CLUMPS NOTED ON SMEAR, COUNT APPEARS DECREASED 353   Cardiac Enzymes: No results for input(s): CKTOTAL, CKMB, CKMBINDEX, TROPONINI in the last 168 hours. BNP: Invalid input(s): POCBNP CBG: Recent Labs  Lab 06/07/18 2113 06/08/18 0839 06/08/18 1146 06/08/18 1727 06/09/18 0802  GLUCAP 126* 161* 213* 122* 135*   D-Dimer No results for input(s): DDIMER in the last 72 hours. Hgb A1c No results for input(s): HGBA1C in the last 72 hours. Lipid Profile No results for input(s): CHOL, HDL, LDLCALC, TRIG, CHOLHDL, LDLDIRECT in the last 72 hours. Thyroid function studies No results for input(s): TSH, T4TOTAL, T3FREE, THYROIDAB in the last 72 hours.  Invalid input(s): FREET3 Anemia work up No results for input(s): VITAMINB12, FOLATE, FERRITIN, TIBC, IRON, RETICCTPCT in the last 72 hours. Urinalysis    Component Value Date/Time   COLORURINE YELLOW 06/03/2018 1624   APPEARANCEUR CLEAR 06/03/2018 1624   LABSPEC 1.015 06/03/2018 1624   PHURINE 5.5 06/03/2018 1624   GLUCOSEU NEGATIVE 06/03/2018 1624   HGBUR NEGATIVE 06/03/2018 1624    BILIRUBINUR NEGATIVE 06/03/2018 1624   KETONESUR NEGATIVE 06/03/2018 1624   PROTEINUR TRACE (A) 06/03/2018 1624   UROBILINOGEN 1.0 02/03/2014 0052   NITRITE NEGATIVE 06/03/2018 1624   LEUKOCYTESUR TRACE (A) 06/03/2018 1624   Sepsis Labs Invalid input(s): PROCALCITONIN,  WBC,  LACTICIDVEN Microbiology Recent Results (from the past 240 hour(s))  Blood culture (routine x 2)     Status: None   Collection Time: 06/03/18  1:32 PM  Result Value Ref Range Status   Specimen Description   Final    BLOOD RIGHT ANTECUBITAL Performed at [facility], [place] [address], [place], [place] [address]    Special Requests   Final    BOTTLES DRAWN AEROBIC AND ANAEROBIC Blood Culture results may not be optimal due to an excessive volume of blood received in culture bottles Performed at [facility] [address] [place], [place] [address]    Culture   Final  NO GROWTH 5 DAYS Performed at [facility], [place] [address] [place], [place] [address]    Report Status 06/08/2018 FINAL  Final  Blood culture (routine x 2)     Status: None   Collection Time: 06/03/18  1:45 PM  Result Value Ref Range Status   Specimen Description   Final    BLOOD LEFT WRIST Performed at [facility], [place] [address] [place], [place] [address]    Special Requests   Final    BOTTLES DRAWN AEROBIC AND ANAEROBIC Blood Culture adequate volume Performed at [facility] [address] [place], [place] [address]    Culture   Final    NO GROWTH 5 DAYS Performed at [facility], [place] [address] [place], [place] [address]    Report Status 06/08/2018 FINAL  Final  Surgical pcr screen     Status: Abnormal   Collection Time: 06/03/18 10:51 PM  Result Value Ref Range Status   MRSA, PCR POSITIVE (A) NEGATIVE Final    Comment: RESULT CALLED TO, READ BACK BY AND VERIFIED WITHBeulah Gandy RN [phone] 06/04/18 A BROWNING    Staphylococcus aureus POSITIVE (A)  NEGATIVE Final    Comment: (NOTE) The Xpert SA Assay (FDA approved for NASAL specimens in patients 59 years of age and older), is one component of a comprehensive surveillance program. It is not intended to diagnose infection nor to guide or monitor treatment. Performed at [facility], [place] [address], [place], [place] [address]   Aerobic/Anaerobic Culture (surgical/deep wound)     Status: None (Preliminary result)   Collection Time: 06/06/18  1:22 PM  Result Value Ref Range Status   Specimen Description ABSCESS LEFT TOE 2ND  Final   Special Requests NONE  Final   Gram Stain   Final    RARE WBC PRESENT, PREDOMINANTLY PMN FEW GRAM POSITIVE COCCI Performed at [facility], [place] [address] [place], [place] [address]    Culture   Final    MODERATE STREPTOCOCCUS CANIS NO ANAEROBES ISOLATED; CULTURE IN PROGRESS FOR 5 DAYS    Report Status PENDING  Incomplete    Please note: You were cared for by a hospitalist during your hospital stay. Once you are discharged, your primary care physician will handle any further medical issues. Please note that NO REFILLS for any discharge medications will be authorized once you are discharged, as it is imperative that you return to your primary care physician (or establish a relationship with a primary care physician if you do not have one) for your post hospital discharge needs so that they can reassess your need for medications and monitor your lab values.    Time coordinating discharge: 40 minutes  SIGNED:   Shelly Coss, MD  [facility] 06/09/2018, 10:56 AM Pager [phone]  If 7PM-7AM, please contact night-coverage [email or web address] Password [email or web address]

## 2018-06-09 NOTE — Clinical Social Work Placement (Signed)
   CLINICAL SOCIAL WORK PLACEMENT  NOTE  Date:  06/09/2018  Patient Details  Name: Gregory [name] MRN: [id number] Date of Birth: 12-Oct-1960  Clinical Social Work is seeking post-discharge placement for this patient at the [place] level of care (*CSW will initial, date and re-position this form in  chart as items are completed):  Yes   Patient/family provided with [place] Work Department's list of facilities offering this level of care within the geographic area requested by the patient (or if unable, by the patient's family).  Yes   Patient/family informed of their freedom to choose among providers that offer the needed level of care, that participate in Medicare, Medicaid or managed care program needed by the patient, have an available bed and are willing to accept the patient.  Yes   Patient/family informed of [facility]'s ownership interest in [facility] and [facility], as well as of the fact that they are under no obligation to receive care at these facilities.  PASRR submitted to EDS on       PASRR number received on       Existing PASRR number confirmed on 06/04/18     FL2 transmitted to all facilities in geographic area requested by pt/family on 06/04/18     FL2 transmitted to all facilities within larger geographic area on       Patient informed that his/her managed care company has contracts with or will negotiate with certain facilities, including the following:        Yes   Patient/family informed of bed offers received.  Patient chooses bed at Other - please specify in the comment section below:([facility])     Physician recommends and patient chooses bed at      Patient to be transferred to Other - please specify in the comment section below:([facility]) on 06/09/18.  Patient to be transferred to facility by [facility]     Patient family notified on 06/09/18 of transfer.  Name of family member notified:  pt daughter  Belenda Cruise     PHYSICIAN Please prepare priority discharge summary, including medications, Please prepare prescriptions     Additional Comment:    _______________________________________________ Alberteen Sam, LCSW 06/09/2018, 11:09 AM

## 2018-06-09 NOTE — Progress Notes (Signed)
Report given to Yvette Rack, RN

## 2018-06-09 NOTE — Progress Notes (Signed)
Patient will DC to: [facility] Anticipated DC date: 06/09/18 Family notified:[name] [facility] ZJ:[facility]  Per MD patient ready for DC to [facility]. RN, patient, patient's family, and facility notified of DC. Discharge Summary sent to facility. RN given number for report [phone]. DC packet on chart. Ambulance transport requested for patient.  CSW signing off.  [place], [place]

## 2018-06-09 NOTE — Clinical Social Work Note (Signed)
Clinical Social Work Assessment  Patient Details  Name: Gregory [name] MRN: [id number] Date of Birth: [birth year]  Date of referral:  06/09/18               Reason for consult:  Discharge Planning                Permission sought to share information with:  Case Manager, Facility Sport and exercise psychologist, Family Supports Permission granted to share information::  Yes, Verbal Permission Granted  Name::     Arboriculturist::  SNF  Relationship::  daughter  Contact Information:  [phone]  Housing/Transportation Living arrangements for the past 2 months:  [place] of Information:  Patient Patient Interpreter Needed:  None Criminal Activity/Legal Involvement Pertinent to Current Situation/Hospitalization:  No - Comment as needed Significant Relationships:  Adult Children Lives with:  Facility Resident Do you feel safe going back to the place where you live?  Yes Need for family participation in patient care:  Yes (Comment)  Care giving concerns:  CSW received referral for possible SNF placement at time of discharge. Spoke with patient regarding possibility of SNF placement . Patient's family   is currently unable to care for him at their home given patient's current needs and fall risk.  Patient expressed understanding of PT recommendation and are agreeable to SNF placement at time of discharge. CSW to continue to follow and assist with discharge planning needs.     Social Worker assessment / plan:  Spoke with patient  concerning possibility of rehab at SNF before returning home. Patient is from [place] and wishes to return there.   Employment status:  Retired Forensic scientist:  Medicaid In [place] PT Recommendations:  [place] / Referral to community resources:  [place]  Patient/Family's Response to care:  Patient  recognize need for rehab before returning home and are agreeable to a SNF in [place]. They report  preference for returning to [place]  . CSW explained insurance authorization process. Patient's family reported that they want patient to get stronger to be able to come back home.    Patient/Family's Understanding of and Emotional Response to Diagnosis, Current Treatment, and Prognosis:  Patient/family is realistic regarding therapy needs and expressed being hopeful for SNF placement. Patient expressed understanding of CSW role and discharge process as well as medical condition. No questions/concerns about plan or treatment.    Emotional Assessment Appearance:  Appears stated age Attitude/Demeanor/Rapport:  Unable to Assess Affect (typically observed):  Unable to Assess Orientation:  Oriented to Self, Oriented to Place, Oriented to  Time, Oriented to Situation Alcohol / Substance use:  Not Applicable Psych involvement (Current and /or in the community):  No (Comment)  Discharge Needs  Concerns to be addressed:  Discharge Planning Concerns Readmission within the last 30 days:  No Current discharge risk:  Dependent with Mobility Barriers to Discharge:  Continued Medical Work up   [employer], LCSW 06/09/2018, 11:08 AM

## 2018-06-09 NOTE — Progress Notes (Signed)
[facility] for Infectious Disease  Date of Admission:  06/03/2018     Total days of antibiotics 5  Ceftriaxone 4         Patient ID: Gregory [name] is a 57 y.o. male with  Principal Problem:   Acute osteomyelitis of left foot ([place]) Active Problems:   COPD (chronic obstructive pulmonary disease) ([place])   Sepsis with acute renal failure without septic shock (HCC)   Chronic diastolic CHF (congestive heart failure) (HCC)   Type II diabetes mellitus with neurological manifestations (HCC)   Chronic pain   Depression, recurrent (HCC)   Septic arthritis ([place])   Severe protein-calorie malnutrition ([place])   Cellulitis of left leg   Cutaneous abscess of left foot   Pressure injury of skin   . amLODipine  5 mg Oral Daily  . amoxicillin-clavulanate  1 tablet Oral Q12H  . atorvastatin  20 mg Oral QHS  . buPROPion  300 mg Oral QHS  . docusate sodium  100 mg Oral BID  . finasteride  5 mg Oral Daily  . heparin injection (subcutaneous)  5,000 Units Subcutaneous Q8H  . insulin aspart  0-15 Units Subcutaneous TID WC  . ipratropium-albuterol  3 mL Nebulization Once  . loratadine  10 mg Oral Daily  . metoprolol tartrate  25 mg Oral Daily  . morphine  15 mg Oral Q12H  . nicotine  14 mg Transdermal Daily  . PARoxetine  20 mg Oral QHS  . pregabalin  150 mg Oral BID  . senna-docusate  2 tablet Oral QHS  . tamsulosin  0.8 mg Oral QPC supper  . vitamin B-12  1,000 mcg Oral Daily    SUBJECTIVE: No complaints. Over and over again states that he is not interested in a PICC line or IV antibiotics. Wants to try a pill first.   No Active Allergies  OBJECTIVE: Vitals:   06/08/18 0425 06/08/18 1016 06/08/18 1256 06/09/18 0555  BP: (!) 164/83 113/75 (!) 170/77 (!) 159/76  Pulse: 81 (!) 101 72 69  Resp: 16  16 16   Temp: 98.9 F (37.2 C) 98.4 F (36.9 C) 98.4 F (36.9 C) 98.7 F (37.1 C)  TempSrc: Oral Oral Oral Oral  SpO2: 98% 100% 98% 100%  Weight:      Height:       Body  mass index is 34.53 kg/m.  Physical Exam Constitutional:      General: He is not in acute distress.    Appearance: He is well-developed.     Comments: Resting in bed quietly.   HENT:     Mouth/Throat:     Dentition: Normal dentition. No dental abscesses.  Cardiovascular:     Rate and Rhythm: Normal rate and regular rhythm.     Heart sounds: Normal heart sounds.  Pulmonary:     Effort: Pulmonary effort is normal.     Breath sounds: Normal breath sounds.  Abdominal:     General: There is no distension.     Palpations: Abdomen is soft.     Tenderness: There is no abdominal tenderness.  Genitourinary:    Comments: Condom cath in place Musculoskeletal:     Comments: LLE wrapped in ACE wrap and VAC in place with good seal   Lymphadenopathy:     Cervical: No cervical adenopathy.  Skin:    General: Skin is warm and dry.     Findings: No rash.     Comments: Partial thickness skin loss/peeling of  skin let inner thigh   Neurological:     Mental Status: He is alert and oriented to person, place, and time.  Psychiatric:        Judgment: Judgment normal.    Lab Results Lab Results  Component Value Date   WBC 13.6 (H) 06/09/2018   HGB 8.2 (L) 06/09/2018   HCT 28.3 (L) 06/09/2018   MCV 92.2 06/09/2018   PLT 353 06/09/2018    Lab Results  Component Value Date   CREATININE 1.46 (H) 06/09/2018   BUN 18 06/09/2018   NA 139 06/09/2018   K 3.7 06/09/2018   CL 107 06/09/2018   CO2 25 06/09/2018    Lab Results  Component Value Date   ALT 13 06/04/2018   AST 14 (L) 06/04/2018   ALKPHOS 107 06/04/2018   BILITOT 0.9 06/04/2018     Microbiology: 12/20 abscess L #2 toe >> streptococcus canis   ASSESSMENT & PLAN:  1. Osteomyelitis with foot abscess s/pleft second ray amputation and debridement (12/20) = post op care per [name]. Streptococcus canis (Group G strep species) with very high likelihood of penicillin sensitivity. With his refusal of PICC line at this time will plan  on 6 week course of amoxicillin-clavulanate 875/125 BID.   2. Medication Monitoring = GFR improved since admission; would like to keep at higher dose of antibiotic. Will check ESR/CRP at follow.   Clinic follow up arranged with Dr. Tommy Medal on Friday January 17th @ 10:30 am   Janene Madeira, MSN, NP-C [facility] for Infectious [place] Pager: [phone]  06/09/2018  11:17 AM

## 2018-06-09 NOTE — Progress Notes (Signed)
[name] to be D/C'd  per MD order. report given to the facilty. VSS, Skin clean, dry and intact without evidence of skin break down, no evidence of skin tears noted.  IV catheter discontinued intact. Site without signs and symptoms of complications. Dressing and pressure applied.  An After Visit Summary was printed and given to the patient. Patient received prescription.  D/c education completed with patient/family including follow up instructions, medication list, d/c activities limitations if indicated, with other d/c instructions as indicated by MD - patient able to verbalize understanding, all questions fully answered.   Patient instructed to return to ED, call 911, or call MD for any changes in condition.   Patient to be escorted via [place], and D/C home via private auto.

## 2018-06-11 LAB — AEROBIC/ANAEROBIC CULTURE W GRAM STAIN (SURGICAL/DEEP WOUND)

## 2018-06-11 LAB — AEROBIC/ANAEROBIC CULTURE (SURGICAL/DEEP WOUND)

## 2018-06-16 ENCOUNTER — Ambulatory Visit (INDEPENDENT_AMBULATORY_CARE_PROVIDER_SITE_OTHER): Payer: Medicaid Other | Admitting: Orthopedic Surgery

## 2018-06-16 ENCOUNTER — Encounter (INDEPENDENT_AMBULATORY_CARE_PROVIDER_SITE_OTHER): Payer: Self-pay | Admitting: Orthopedic Surgery

## 2018-06-16 VITALS — Ht 72.0 in | Wt 254.6 lb

## 2018-06-16 DIAGNOSIS — Z89422 Acquired absence of other left toe(s): Secondary | ICD-10-CM

## 2018-06-16 DIAGNOSIS — S98132A Complete traumatic amputation of one left lesser toe, initial encounter: Secondary | ICD-10-CM

## 2018-06-18 ENCOUNTER — Encounter (INDEPENDENT_AMBULATORY_CARE_PROVIDER_SITE_OTHER): Payer: Self-pay | Admitting: Orthopedic Surgery

## 2018-06-18 DIAGNOSIS — S98132A Complete traumatic amputation of one left lesser toe, initial encounter: Secondary | ICD-10-CM | POA: Insufficient documentation

## 2018-06-18 NOTE — Progress Notes (Signed)
Office Visit Note   Patient: Gregory [name]           Date of Birth: 05-21-1961           MRN: [id number] Visit Date: 06/16/2018              Requested by: Gregory Marble, MD [place], [place] [address] PCP: Gregory Marble, MD  Chief Complaint  Patient presents with  . Left Foot - Routine Post Op    2nd toe amp      HPI: Patient is a 58 year old gentleman who is 1 week status post first and second ray amputations left foot.  He had massive venous insufficiency and the VAC was wrapped up the entire leg.  Assessment & Plan: Visit Diagnoses:  1. Amputated toe of left foot ([place])     Plan: We will have skilled nursing provide Dynaflex wraps 3 times a week for the left leg continue his antibiotics continue nonweightbearing.  Follow-Up Instructions: Return in about 2 weeks (around 06/30/2018).   Ortho Exam  Patient is alert, oriented, no adenopathy, well-dressed, normal affect, normal respiratory effort. Examination the wound VAC is removed there is significant maceration of the skin of the left leg but also significant decreased edema.  The wound edges are well approximated.  Patient will need to continue with compression wraps with a Dynaflex wrap that should resolve the maceration.  Imaging: No results found. No images are attached to the encounter.  Labs: Lab Results  Component Value Date   HGBA1C 8.0 (H) 06/04/2018   HGBA1C 7.9 (H) 04/25/2018   HGBA1C 7.2 (H) 03/25/2017   ESRSEDRATE 120 (H) 03/25/2017   ESRSEDRATE 84 (H) 10/02/2016   ESRSEDRATE 132 (H) 12/01/2015   CRP 4.9 (H) 03/25/2017   CRP 30.6 (H) 10/02/2016   CRP 13.7 (H) 11/11/2015   REPTSTATUS 06/11/2018 FINAL 06/06/2018   GRAMSTAIN  06/06/2018    RARE WBC PRESENT, PREDOMINANTLY PMN FEW GRAM POSITIVE COCCI    CULT  06/06/2018    MODERATE STREPTOCOCCUS CANIS NO ANAEROBES ISOLATED Performed at [facility], [place] [address] [place], [place] [address]    LABORGA PROVIDENCIA  [name] (A) 01/12/2017     Lab Results  Component Value Date   ALBUMIN 2.2 (L) 06/04/2018   ALBUMIN 2.8 (L) 06/03/2018   ALBUMIN 3.2 (L) 03/28/2017   PREALBUMIN 23.3 03/25/2017    Body mass index is 34.53 kg/m.  Orders:  No orders of the defined types were placed in this encounter.  No orders of the defined types were placed in this encounter.    Procedures: No procedures performed  Clinical Data: No additional findings.  ROS:  All other systems negative, except as noted in the HPI. Review of Systems  Objective: Vital Signs: Ht 6' (1.829 m)   Wt 254 lb 10.1 oz (115.5 kg)   BMI 34.53 kg/m   Specialty Comments:  No specialty comments available.  PMFS History: Patient Active Problem List   Diagnosis Date Noted  . Amputated toe of left foot ([place]) 06/18/2018  . Streptococcal infection   . Pressure injury of skin 06/06/2018  . Cutaneous abscess of left foot   . Severe protein-calorie malnutrition ([place])   . Cellulitis of left leg   . Septic arthritis ([place]) 06/03/2018  . Idiopathic chronic venous hypertension of left lower extremity with ulcer and inflammation ([place]) 11/14/2017  . Great toe amputation status, left 11/14/2017  . Acquired absence of right leg below knee ([place]) 06/20/2017  .  Osteomyelitis ([place]) 03/25/2017  . Skin cancer 03/25/2017  . Depression, recurrent ([place]) 11/05/2016  . Diabetic ulcer of toe of left foot ([place]) 10/02/2016  . 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 Chronic pain 09/27/2016  . Dyslipidemia associated with type 2 diabetes mellitus ([place]) 06/06/2016  . GERD without esophagitis 05/11/2016  . Hypertensive heart disease with congestive heart failure ([place]) 12/15/2015  . Spinal stenosis in cervical region 12/15/2015  . Type II diabetes mellitus with neurological manifestations ([place]) 12/15/2015  . Chronic diastolic CHF (congestive heart failure) ([place])   . Pulmonary hypertension ([place])   . Urinary retention   . Foot drop, bilateral   . Sepsis  with acute renal failure without septic shock ([place])   . MRSA bacteremia 10/03/2015  . COPD (chronic obstructive pulmonary disease) ([place]) 10/03/2015  . Acute osteomyelitis of left foot ([place]) 10/03/2015   Past Medical History:  Diagnosis Date  . Acquired absence of left great toe ([place])   . Acquired absence of right leg below knee ([place])   . Acute osteomyelitis of calcaneum, right ([place]) 10/02/2016  . Acute respiratory failure ([place])   . Anemia   . Basal cell carcinoma, eyelid   . Cancer ([place])   . CHF (congestive heart failure) (HCC)    chronic diastolic   . Chronic back pain   . Chronic kidney disease    stage 3   . Chronic multifocal osteomyelitis (HCC)    of ankle and foot   . Chronic pain syndrome   . COPD (chronic obstructive pulmonary disease) ([place])   . DDD (degenerative disc disease), lumbar   . Depression    major depressive disorder   . Diabetes mellitus without complication ([place])    type 2   . Diabetic ulcer of toe of left foot ([place]) 10/02/2016  . Difficulty in walking, not elsewhere classified   . 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 Foot drop, bilateral   . Foot drop, right 12/27/2015  . GERD (gastroesophageal reflux disease)   . Herpesviral vesicular dermatitis   . Hyperlipidemia   . Hyperlipidemia   . Hypertension   . Insomnia   . Malignant melanoma of other parts of face ([place])   . MRSA bacteremia   . Muscle weakness (generalized)   . Nasal congestion   . Neuromuscular dysfunction of bladder   . Osteomyelitis ([place])   . Osteomyelitis ([place])   . Other idiopathic peripheral autonomic neuropathy   . Retention of urine, unspecified   . Squamous cell carcinoma of skin   . Unsteadiness on feet   . Urinary retention   . Urinary retention   . Urinary tract infection     Family History  Problem Relation Age of Onset  . Cancer Other   . Diabetes Other     Past Surgical History:  Procedure Laterality Date  . AMPUTATION Left 03/29/2017   Procedure: LEFT GREAT TOE  AMPUTATION AT METATARSOPHALANGEAL JOINT;  Surgeon: [name], MD;  Location: [facility];  Service: Orthopedics;  Laterality: Left;  . AMPUTATION Right 03/29/2017   Procedure: RIGHT BELOW KNEE AMPUTATION;  Surgeon: [name], MD;  Location: [facility];  Service: Orthopedics;  Laterality: Right;  . AMPUTATION Left 06/06/2018   Procedure: LEFT 2ND TOE AMPUTATION;  Surgeon: [name], MD;  Location: [facility];  Service: Orthopedics;  Laterality: Left;  . ANTERIOR CERVICAL CORPECTOMY N/A 11/25/2015   Procedure: ANTERIOR CERVICAL FIVE CORPECTOMY Cervical four - six fusion;  Surgeon: Consuella Lose, MD;  Location: [facility];  Service: Neurosurgery;  Laterality: N/A;  ANTERIOR CERVICAL FIVE CORPECTOMY Cervical four - six fusion  . CYSTOSCOPY WITH RETROGRADE URETHROGRAM N/A 04/25/2018   Procedure: CYSTOSCOPY WITH RETROGRADE URETHROGRAM/ BALLOON DILATION;  Surgeon: Kathie Rhodes, MD;  Location: [facility];  Service: Urology;  Laterality: N/A;  . POSTERIOR CERVICAL FUSION/FORAMINOTOMY N/A 11/29/2015   Procedure: Cervical Three-Cervical Seven Posterior Cervical Laminectomy with Fusion;  Surgeon: Consuella Lose, MD;  Location: [facility];  Service: Neurosurgery;  Laterality: N/A;  Cervical Three-Cervical Seven Posterior Cervical Laminectomy with Fusion   Social History   Occupational History  . Not on file  Tobacco Use  . Smoking status: Former Smoker    Packs/day: 3.00    Types: Cigarettes  . Smokeless tobacco: Never Used  Substance and Sexual Activity  . Alcohol use: No  . Drug use: No  . Sexual activity: Not on file

## 2018-06-30 ENCOUNTER — Ambulatory Visit (INDEPENDENT_AMBULATORY_CARE_PROVIDER_SITE_OTHER): Payer: Medicaid Other | Admitting: Physician Assistant

## 2018-06-30 ENCOUNTER — Encounter (INDEPENDENT_AMBULATORY_CARE_PROVIDER_SITE_OTHER): Payer: Self-pay | Admitting: Orthopedic Surgery

## 2018-06-30 VITALS — Ht 72.0 in | Wt 254.6 lb

## 2018-06-30 DIAGNOSIS — I110 Hypertensive heart disease with heart failure: Secondary | ICD-10-CM

## 2018-06-30 DIAGNOSIS — S98132A Complete traumatic amputation of one left lesser toe, initial encounter: Secondary | ICD-10-CM

## 2018-06-30 DIAGNOSIS — E1142 Type 2 diabetes mellitus with diabetic polyneuropathy: Secondary | ICD-10-CM

## 2018-06-30 DIAGNOSIS — L97929 Non-pressure chronic ulcer of unspecified part of left lower leg with unspecified severity: Secondary | ICD-10-CM

## 2018-06-30 DIAGNOSIS — I87332 Chronic venous hypertension (idiopathic) with ulcer and inflammation of left lower extremity: Secondary | ICD-10-CM

## 2018-06-30 DIAGNOSIS — Z89422 Acquired absence of other left toe(s): Secondary | ICD-10-CM

## 2018-06-30 DIAGNOSIS — Z794 Long term (current) use of insulin: Secondary | ICD-10-CM

## 2018-06-30 DIAGNOSIS — I5032 Chronic diastolic (congestive) heart failure: Secondary | ICD-10-CM

## 2018-06-30 NOTE — Progress Notes (Addendum)
Office Visit Note   Patient: Gregory [name]           Date of Birth: 30-Jan-1961           MRN: [id number] Visit Date: 06/30/2018              Requested by: [name], MD [place], [place] [address] PCP: [name], MD  Chief Complaint  Patient presents with  . Left Foot - Routine Post Op      HPI: The patient is a 58 yo gentleman who is seen for post operative follow up following amputation of the left 1st and 2nd toes for osteomyelitis. He is in [facility] for rehab. He reports he was unable to tolerate the Dynaflex compression wraps as it was " cutting off his circulation" and he has clostrophobia.   Assessment & Plan: Visit Diagnoses:  1. Amputated toe of left foot ([place])   2. Idiopathic chronic venous hypertension of left lower extremity with ulcer and inflammation (HCC)   3. Type 2 diabetes mellitus with diabetic polyneuropathy, with long-term current use of insulin ([place])   4. Hypertensive heart disease with chronic diastolic congestive heart failure ([place])     Plan: The patient initially agreed to a Dynaflex multilayer compression wrap but then after the application began, he refused to have the third layer of compression applied.  [name] discussed with the patient at this point that he might require a below the knee amputation and the patient states that he understands that this may put him at risk for amputation but he declines to have the final layer compression applied.  He acknowledges that this will increase his risk of needing a below the knee amputation.  We recommended continued elevation nonweightbearing and he will follow-up here in 1 week.  He will continue his antibiotics.  Follow-Up Instructions: Return in about 1 week (around 07/07/2018).   Ortho Exam  Patient is alert, oriented, no adenopathy, well-dressed, normal affect, normal respiratory effort. The left foot amputation site has dehiscence severe drainage and edema and  excoriation.  He also has multiple areas of excoriation and edema of the calf into the thigh.  His third toe appears to be necrotic.  We are able to Doppler his dorsalis pedis pulse and it is biphasic.  Imaging: No results found.   Labs: Lab Results  Component Value Date   HGBA1C 8.0 (H) 06/04/2018   HGBA1C 7.9 (H) 04/25/2018   HGBA1C 7.2 (H) 03/25/2017   ESRSEDRATE 120 (H) 03/25/2017   ESRSEDRATE 84 (H) 10/02/2016   ESRSEDRATE 132 (H) 12/01/2015   CRP 4.9 (H) 03/25/2017   CRP 30.6 (H) 10/02/2016   CRP 13.7 (H) 11/11/2015   REPTSTATUS 06/11/2018 FINAL 06/06/2018   GRAMSTAIN  06/06/2018    RARE WBC PRESENT, PREDOMINANTLY PMN FEW GRAM POSITIVE COCCI    CULT  06/06/2018    MODERATE STREPTOCOCCUS CANIS NO ANAEROBES ISOLATED Performed at [facility], [place] [address], [place], [place] [address]    LABORGA PROVIDENCIA [name] (A) 01/12/2017     Lab Results  Component Value Date   ALBUMIN 2.2 (L) 06/04/2018   ALBUMIN 2.8 (L) 06/03/2018   ALBUMIN 3.2 (L) 03/28/2017   PREALBUMIN 23.3 03/25/2017    Body mass index is 34.53 kg/m.  Orders:  No orders of the defined types were placed in this encounter.  No orders of the defined types were placed in this encounter.    Procedures: No procedures performed  Clinical Data: No additional findings.  ROS:  All other systems negative, except as noted in the HPI. Review of Systems  Objective: Vital Signs: Ht 6' (1.829 m)   Wt 254 lb 10.1 oz (115.5 kg)   BMI 34.53 kg/m   Specialty Comments:  No specialty comments available.  PMFS History: Patient Active Problem List   Diagnosis Date Noted  . Amputated toe of left foot ([place]) 06/18/2018  . Streptococcal infection   . Pressure injury of skin 06/06/2018  . Cutaneous abscess of left foot   . Severe protein-calorie malnutrition ([place])   . Cellulitis of left leg   . Septic arthritis ([place]) 06/03/2018  . Idiopathic chronic venous hypertension of left lower extremity  with ulcer and inflammation ([place]) 11/14/2017  . Great toe amputation status, left 11/14/2017  . Acquired absence of right leg below knee ([place]) 06/20/2017  . Osteomyelitis ([place]) 03/25/2017  . Skin cancer 03/25/2017  . Depression, recurrent ([place]) 11/05/2016  . Diabetic ulcer of toe of left foot ([place]) 10/02/2016  . 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 Chronic pain 09/27/2016  . Dyslipidemia associated with type 2 diabetes mellitus ([place]) 06/06/2016  . GERD without esophagitis 05/11/2016  . Hypertensive heart disease with congestive heart failure ([place]) 12/15/2015  . Spinal stenosis in cervical region 12/15/2015  . Type II diabetes mellitus with neurological manifestations ([place]) 12/15/2015  . Chronic diastolic CHF (congestive heart failure) ([place])   . Pulmonary hypertension ([place])   . Urinary retention   . Foot drop, bilateral   . Sepsis with acute renal failure without septic shock ([place])   . MRSA bacteremia 10/03/2015  . COPD (chronic obstructive pulmonary disease) ([place]) 10/03/2015  . Acute osteomyelitis of left foot ([place]) 10/03/2015   Past Medical History:  Diagnosis Date  . Acquired absence of left great toe ([place])   . Acquired absence of right leg below knee ([place])   . Acute osteomyelitis of calcaneum, right ([place]) 10/02/2016  . Acute respiratory failure ([place])   . Anemia   . Basal cell carcinoma, eyelid   . Cancer ([place])   . CHF (congestive heart failure) (HCC)    chronic diastolic   . Chronic back pain   . Chronic kidney disease    stage 3   . Chronic multifocal osteomyelitis (HCC)    of ankle and foot   . Chronic pain syndrome   . COPD (chronic obstructive pulmonary disease) ([place])   . DDD (degenerative disc disease), lumbar   . Depression    major depressive disorder   . Diabetes mellitus without complication ([place])    type 2   . Diabetic ulcer of toe of left foot ([place]) 10/02/2016  . Difficulty in walking, not elsewhere classified   . 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 Foot drop,  bilateral   . Foot drop, right 12/27/2015  . GERD (gastroesophageal reflux disease)   . Herpesviral vesicular dermatitis   . Hyperlipidemia   . Hyperlipidemia   . Hypertension   . Insomnia   . Malignant melanoma of other parts of face ([place])   . MRSA bacteremia   . Muscle weakness (generalized)   . Nasal congestion   . Neuromuscular dysfunction of bladder   . Osteomyelitis ([place])   . Osteomyelitis ([place])   . Other idiopathic peripheral autonomic neuropathy   . Retention of urine, unspecified   . Squamous cell carcinoma of skin   . Unsteadiness on feet   . Urinary retention   . Urinary retention   . Urinary tract  infection     Family History  Problem Relation Age of Onset  . Cancer Other   . Diabetes Other     Past Surgical History:  Procedure Laterality Date  . AMPUTATION Left 03/29/2017   Procedure: LEFT GREAT TOE AMPUTATION AT METATARSOPHALANGEAL JOINT;  Surgeon: [name], MD;  Location: [facility];  Service: Orthopedics;  Laterality: Left;  . AMPUTATION Right 03/29/2017   Procedure: RIGHT BELOW KNEE AMPUTATION;  Surgeon: [name], MD;  Location: [facility];  Service: Orthopedics;  Laterality: Right;  . AMPUTATION Left 06/06/2018   Procedure: LEFT 2ND TOE AMPUTATION;  Surgeon: [name], MD;  Location: [facility];  Service: Orthopedics;  Laterality: Left;  . ANTERIOR CERVICAL CORPECTOMY N/A 11/25/2015   Procedure: ANTERIOR CERVICAL FIVE CORPECTOMY Cervical four - six fusion;  Surgeon: Consuella Lose, MD;  Location: [facility];  Service: Neurosurgery;  Laterality: N/A;  ANTERIOR CERVICAL FIVE CORPECTOMY Cervical four - six fusion  . CYSTOSCOPY WITH RETROGRADE URETHROGRAM N/A 04/25/2018   Procedure: CYSTOSCOPY WITH RETROGRADE URETHROGRAM/ BALLOON DILATION;  Surgeon: Kathie Rhodes, MD;  Location[facility];  Service: Urology;  Laterality: N/A;  . POSTERIOR CERVICAL FUSION/FORAMINOTOMY N/A 11/29/2015   Procedure: Cervical Three-Cervical Seven Posterior Cervical Laminectomy with  Fusion;  Surgeon: Consuella Lose, MD;  Location: [facility];  Service: Neurosurgery;  Laterality: N/A;  Cervical Three-Cervical Seven Posterior Cervical Laminectomy with Fusion   Social History   Occupational History  . Not on file  Tobacco Use  . Smoking status: Former Smoker    Packs/day: 3.00    Types: Cigarettes  . Smokeless tobacco: Never Used  Substance and Sexual Activity  . Alcohol use: No  . Drug use: No  . Sexual activity: Not on file

## 2018-07-04 ENCOUNTER — Inpatient Hospital Stay: Payer: Medicaid Other | Admitting: Infectious Disease

## 2018-07-07 ENCOUNTER — Encounter (INDEPENDENT_AMBULATORY_CARE_PROVIDER_SITE_OTHER): Payer: Self-pay | Admitting: Physician Assistant

## 2018-07-07 ENCOUNTER — Ambulatory Visit (INDEPENDENT_AMBULATORY_CARE_PROVIDER_SITE_OTHER): Payer: Medicaid Other | Admitting: Physician Assistant

## 2018-07-07 VITALS — Ht 72.0 in | Wt 254.0 lb

## 2018-07-07 DIAGNOSIS — E1142 Type 2 diabetes mellitus with diabetic polyneuropathy: Secondary | ICD-10-CM

## 2018-07-07 DIAGNOSIS — I87332 Chronic venous hypertension (idiopathic) with ulcer and inflammation of left lower extremity: Secondary | ICD-10-CM

## 2018-07-07 DIAGNOSIS — Z89422 Acquired absence of other left toe(s): Secondary | ICD-10-CM

## 2018-07-07 DIAGNOSIS — S98132A Complete traumatic amputation of one left lesser toe, initial encounter: Secondary | ICD-10-CM

## 2018-07-07 DIAGNOSIS — Z794 Long term (current) use of insulin: Secondary | ICD-10-CM

## 2018-07-07 DIAGNOSIS — L97929 Non-pressure chronic ulcer of unspecified part of left lower leg with unspecified severity: Secondary | ICD-10-CM

## 2018-07-07 NOTE — Progress Notes (Signed)
Office Visit Note   Patient: Gregory [name]           Date of Birth: 24-Feb-1961           MRN: [id number] Visit Date: 07/07/2018              Requested by: [name], MD [place], [place] [address] PCP: [name], MD  Chief Complaint  Patient presents with  . Left Foot - Routine Post Op    06/06/18 left foot 1st and 2nd ray amputation       HPI: Patient is a 58 year old gentleman who comes in with his daughter today who is an Therapist, sports at [employer].  He is seen for postoperative follow-up following amputation of the left first and second toes for osteomyelitis.  He remains in skilled nursing for rehabilitation.  He has been unable to tolerate compression dressings which have been recommended and comes in today with a partial multilayer compression wrap but with severe continued edema of the left foot.  He is on Augmentin for antibiotic coverage.  He is having some yellow drainage and further dehiscence of the incision.  Assessment & Plan: Visit Diagnoses:  1. Amputated toe of left foot ([place])   2. Idiopathic chronic venous hypertension of left lower extremity with ulcer and inflammation (HCC)   3. Type 2 diabetes mellitus with diabetic polyneuropathy, with long-term current use of insulin (HCC)     Plan: Discussed compression again with the patient who is here with his daughter who is an Therapist, sports today and the patient is agreeable to a Dynaflex compression wraps.  These are going to be applied today and we recommend re-wrapping at his skilled nursing facility on Thursday or Friday of this week and then he will follow-up next week.  Advised the patient to elevate as much as possible and nonweightbearing through the left foot.  Continue antibiotic coverage at the nursing facility.  Follow-up next week.  Follow-Up Instructions: Return in about 1 week (around 07/14/2018).   Ortho Exam  Patient is alert, oriented, no adenopathy, well-dressed, normal affect,  normal respiratory effort. Left foot 1st and 2nd ray amputation sites are continuing to heal marginally due to severe edema. There is necrosis of the incisional borders and dehiscence of the incision. The 3rd toe now has increased gangrenous changes. There are palpable pedal pulses, but severe edema through out the left foot and lower leg.   Imaging: No results found.   Labs: Lab Results  Component Value Date   HGBA1C 8.0 (H) 06/04/2018   HGBA1C 7.9 (H) 04/25/2018   HGBA1C 7.2 (H) 03/25/2017   ESRSEDRATE 120 (H) 03/25/2017   ESRSEDRATE 84 (H) 10/02/2016   ESRSEDRATE 132 (H) 12/01/2015   CRP 4.9 (H) 03/25/2017   CRP 30.6 (H) 10/02/2016   CRP 13.7 (H) 11/11/2015   REPTSTATUS 06/11/2018 FINAL 06/06/2018   GRAMSTAIN  06/06/2018    RARE WBC PRESENT, PREDOMINANTLY PMN FEW GRAM POSITIVE COCCI    CULT  06/06/2018    MODERATE STREPTOCOCCUS CANIS NO ANAEROBES ISOLATED Performed at [facility], [place] [address], [place], [place] [address]    LABORGA PROVIDENCIA [name] (A) 01/12/2017     Lab Results  Component Value Date   ALBUMIN 2.2 (L) 06/04/2018   ALBUMIN 2.8 (L) 06/03/2018   ALBUMIN 3.2 (L) 03/28/2017   PREALBUMIN 23.3 03/25/2017    Body mass index is 34.45 kg/m.  Orders:  No orders of the defined types were placed  in this encounter.  No orders of the defined types were placed in this encounter.    Procedures: No procedures performed  Clinical Data: No additional findings.  ROS:  All other systems negative, except as noted in the HPI. Review of Systems  Objective: Vital Signs: Ht 6' (1.829 m)   Wt 254 lb (115.2 kg)   BMI 34.45 kg/m   Specialty Comments:  No specialty comments available.  PMFS History: Patient Active Problem List   Diagnosis Date Noted  . Amputated toe of left foot ([place]) 06/18/2018  . Streptococcal infection   . Pressure injury of skin 06/06/2018  . Cutaneous abscess of left foot   . Severe protein-calorie malnutrition ([place])    . Cellulitis of left leg   . Septic arthritis ([place]) 06/03/2018  . Idiopathic chronic venous hypertension of left lower extremity with ulcer and inflammation ([place]) 11/14/2017  . Great toe amputation status, left 11/14/2017  . Acquired absence of right leg below knee ([place]) 06/20/2017  . Osteomyelitis ([place]) 03/25/2017  . Skin cancer 03/25/2017  . Depression, recurrent ([place]) 11/05/2016  . Diabetic ulcer of toe of left foot ([place]) 10/02/2016  . 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 Chronic pain 09/27/2016  . Dyslipidemia associated with type 2 diabetes mellitus ([place]) 06/06/2016  . GERD without esophagitis 05/11/2016  . Hypertensive heart disease with congestive heart failure ([place]) 12/15/2015  . Spinal stenosis in cervical region 12/15/2015  . Type II diabetes mellitus with neurological manifestations ([place]) 12/15/2015  . Chronic diastolic CHF (congestive heart failure) ([place])   . Pulmonary hypertension ([place])   . Urinary retention   . Foot drop, bilateral   . Sepsis with acute renal failure without septic shock ([place])   . MRSA bacteremia 10/03/2015  . COPD (chronic obstructive pulmonary disease) ([place]) 10/03/2015  . Acute osteomyelitis of left foot ([place]oper[place]) 10/03/2015   Past Medical History:  Diagnosis Date  . Acquired absence of left great toe (Delph[place])   . Acquired absence of right leg below knee ([place])   . Acute osteomyelitis of calcaneum, right ([place]) 10/02/2016  . Acute respiratory failure ([place])   . Anemia   . Basal cell carcinoma, eyelid   . Cancer ([place])   . CHF (congestive heart failure) (HCC)    chronic diastolic   . Chronic back pain   . Chronic kidney disease    stage 3   . Chronic multifocal osteomyelitis (HCC)    of ankle and foot   . Chronic pain syndrome   . COPD (chronic obstructive pulmonary disease) ([place])   . DDD (degenerative disc disease), lumbar   . Depression    major depressive disorder   . Diabetes mellitus without complication ([place])    type 2   . Diabetic ulcer of toe  of left foot ([place]) 10/02/2016  . Difficulty in walking, not elsewhere classified   . 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 Foot drop, bilateral   . Foot drop, right 12/27/2015  . GERD (gastroesophageal reflux disease)   . Herpesviral vesicular dermatitis   . Hyperlipidemia   . Hyperlipidemia   . Hypertension   . Insomnia   . Malignant melanoma of other parts of face ([place])   . MRSA bacteremia   . Muscle weakness (generalized)   . Nasal congestion   . Neuromuscular dysfunction of bladder   . Osteomyelitis ([place])   . Osteomyelitis ([place])   . Other idiopathic peripheral autonomic neuropathy   . Retention of urine, unspecified   . Squamous cell carcinoma of skin   .  Unsteadiness on feet   . Urinary retention   . Urinary retention   . Urinary tract infection     Family History  Problem Relation Age of Onset  . Cancer Other   . Diabetes Other     Past Surgical History:  Procedure Laterality Date  . AMPUTATION Left 03/29/2017   Procedure: LEFT GREAT TOE AMPUTATION AT METATARSOPHALANGEAL JOINT;  Surgeon: [name], MD;  Location: [facility];  Service: Orthopedics;  Laterality: Left;  . AMPUTATION Right 03/29/2017   Procedure: RIGHT BELOW KNEE AMPUTATION;  Surgeon: [name], MD;  Location: [facility];  Service: Orthopedics;  Laterality: Right;  . AMPUTATION Left 06/06/2018   Procedure: LEFT 2ND TOE AMPUTATION;  Surgeon: [name], MD;  Location: [facility];  Service: Orthopedics;  Laterality: Left;  . ANTERIOR CERVICAL CORPECTOMY N/A 11/25/2015   Procedure: ANTERIOR CERVICAL FIVE CORPECTOMY Cervical four - six fusion;  Surgeon: Consuella Lose, MD;  Location: [facility];  Service: Neurosurgery;  Laterality: N/A;  ANTERIOR CERVICAL FIVE CORPECTOMY Cervical four - six fusion  . CYSTOSCOPY WITH RETROGRADE URETHROGRAM N/A 04/25/2018   Procedure: CYSTOSCOPY WITH RETROGRADE URETHROGRAM/ BALLOON DILATION;  Surgeon: Kathie Rhodes, MD;  [facility];  Service: Urology;  Laterality: N/A;  .  POSTERIOR CERVICAL FUSION/FORAMINOTOMY N/A 11/29/2015   Procedure: Cervical Three-Cervical Seven Posterior Cervical Laminectomy with Fusion;  Surgeon: Consuella Lose, MD;  Location: [facility];  Service: Neurosurgery;  Laterality: N/A;  Cervical Three-Cervical Seven Posterior Cervical Laminectomy with Fusion   Social History   Occupational History  . Not on file  Tobacco Use  . Smoking status: Former Smoker    Packs/day: 3.00    Types: Cigarettes  . Smokeless tobacco: Never Used  Substance and Sexual Activity  . Alcohol use: No  . Drug use: No  . Sexual activity: Not on file

## 2018-07-08 ENCOUNTER — Ambulatory Visit (INDEPENDENT_AMBULATORY_CARE_PROVIDER_SITE_OTHER): Payer: Medicaid Other | Admitting: Infectious Disease

## 2018-07-08 ENCOUNTER — Encounter: Payer: Self-pay | Admitting: Infectious Disease

## 2018-07-08 VITALS — BP 129/65 | HR 98

## 2018-07-08 DIAGNOSIS — M86172 Other acute osteomyelitis, left ankle and foot: Secondary | ICD-10-CM | POA: Diagnosis not present

## 2018-07-08 DIAGNOSIS — G062 Extradural and subdural abscess, unspecified: Secondary | ICD-10-CM

## 2018-07-08 DIAGNOSIS — I96 Gangrene, not elsewhere classified: Secondary | ICD-10-CM | POA: Diagnosis not present

## 2018-07-08 DIAGNOSIS — A491 Streptococcal infection, unspecified site: Secondary | ICD-10-CM

## 2018-07-08 DIAGNOSIS — N179 Acute kidney failure, unspecified: Secondary | ICD-10-CM | POA: Diagnosis not present

## 2018-07-08 DIAGNOSIS — M00279 Other streptococcal arthritis, unspecified ankle and foot: Secondary | ICD-10-CM | POA: Diagnosis not present

## 2018-07-08 DIAGNOSIS — E08621 Diabetes mellitus due to underlying condition with foot ulcer: Secondary | ICD-10-CM | POA: Diagnosis not present

## 2018-07-08 DIAGNOSIS — R7881 Bacteremia: Secondary | ICD-10-CM

## 2018-07-08 DIAGNOSIS — L97522 Non-pressure chronic ulcer of other part of left foot with fat layer exposed: Secondary | ICD-10-CM

## 2018-07-08 DIAGNOSIS — R6520 Severe sepsis without septic shock: Secondary | ICD-10-CM

## 2018-07-08 DIAGNOSIS — A4101 Sepsis due to Methicillin susceptible Staphylococcus aureus: Secondary | ICD-10-CM | POA: Diagnosis not present

## 2018-07-08 HISTORY — DX: Gangrene, not elsewhere classified: I96

## 2018-07-08 MED ORDER — AMOXICILLIN-POT CLAVULANATE 875-125 MG PO TABS: 1.0000 | ORAL_TABLET | Freq: Two times a day (BID) | ORAL | 2 refills | Status: AC

## 2018-07-08 NOTE — Progress Notes (Signed)
Subjective:   Chief complaint: My pain is not well controlled in my foot   Patient ID: Gregory [name], male    DOB: Oct 21, 1960, 58 y.o.   MRN: [id number]  HPI  4-year-old Caucasian man with multiple medical problems including diabetes mellitus and chronic kidney disease status post below the knee amputation on the right side who I saw in 2017 when he had disseminated MRSA infection likely originating from his toe on his left foot.  At that time he also had dissemination of MRSA to his C-spine and required neurosurgery.  My intention back then was to have him on chronic doxycycline but he did not continue in care.  He ultimately had amputation of that toe.  He was then admitted this December with sepsis due to 2 osteomyelitis involving his second toe.  He is status post first ray amputation with Streptococcus canis being isolated on culture.  He did not want to go on IV antibiotics and we therefore treated him with Augmentin now nearing 6 weeks of antimicrobial therapy.  Being followed closely by [name] as well as [name].  He is me that his pain is not well controlled and it goes up to a 10 out of 10 in severity and is throbbing but episodic.  He said it is better relative to when he was admitted to the hospital.  He has had problems with edema in this leg and orthopedics is instituted Dynaflex dressing changes.  There is also increasing necrotic changes of the adjacent toe and I am worried that this will require amputation ultimately.  He says he had labs done yesterday at his nursing facility which is [facility].  Past Medical History:  Diagnosis Date  . Acquired absence of left great toe ([place])   . Acquired absence of right leg below knee ([place])   . Acute osteomyelitis of calcaneum, right ([place]) 10/02/2016  . Acute respiratory failure ([place])   . Anemia   . Basal cell carcinoma, eyelid   . Cancer ([place])   . CHF (congestive heart failure) (HCC)    chronic diastolic   .  Chronic back pain   . Chronic kidney disease    stage 3   . Chronic multifocal osteomyelitis ([facility])    of ankle and foot   . Chronic pain syndrome   . COPD (chronic obstructive pulmonary disease) ([place])   . DDD (degenerative disc disease), lumbar   . Depression    major depressive disorder   . Diabetes mellitus without complication ([place])    type 2   . Diabetic ulcer of toe of left foot ([place]) 10/02/2016  . Difficulty in walking, not elsewhere classified   . 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 Foot drop, bilateral   . Foot drop, right 12/27/2015  . GERD (gastroesophageal reflux disease)   . Herpesviral vesicular dermatitis   . Hyperlipidemia   . Hyperlipidemia   . Hypertension   . Insomnia   . Malignant melanoma of other parts of face ([place])   . MRSA bacteremia   . Muscle weakness (generalized)   . Nasal congestion   . Neuromuscular dysfunction of bladder   . Osteomyelitis ([place])   . Osteomyelitis ([place])   . Other idiopathic peripheral autonomic neuropathy   . Retention of urine, unspecified   . Squamous cell carcinoma of skin   . Unsteadiness on feet   . Urinary retention   . Urinary retention   . Urinary tract infection     Past  Surgical History:  Procedure Laterality Date  . AMPUTATION Left 03/29/2017   Procedure: LEFT GREAT TOE AMPUTATION AT METATARSOPHALANGEAL JOINT;  Surgeon: [name], MD;  Location: [facility];  Service: Orthopedics;  Laterality: Left;  . AMPUTATION Right 03/29/2017   Procedure: RIGHT BELOW KNEE AMPUTATION;  Surgeon: [name], MD;  Location: [facility];  Service: Orthopedics;  Laterality: Right;  . AMPUTATION Left 06/06/2018   Procedure: LEFT 2ND TOE AMPUTATION;  Surgeon: [name], MD;  Location: [facility];  Service: Orthopedics;  Laterality: Left;  . ANTERIOR CERVICAL CORPECTOMY N/A 11/25/2015   Procedure: ANTERIOR CERVICAL FIVE CORPECTOMY Cervical four - six fusion;  Surgeon: Consuella Lose, MD;  Location: [facility];  Service: Neurosurgery;   Laterality: N/A;  ANTERIOR CERVICAL FIVE CORPECTOMY Cervical four - six fusion  . CYSTOSCOPY WITH RETROGRADE URETHROGRAM N/A 04/25/2018   Procedure: CYSTOSCOPY WITH RETROGRADE URETHROGRAM/ BALLOON DILATION;  Surgeon: Kathie Rhodes, MD;  Location: [facility];  Service: Urology;  Laterality: N/A;  . POSTERIOR CERVICAL FUSION/FORAMINOTOMY N/A 11/29/2015   Procedure: Cervical Three-Cervical Seven Posterior Cervical Laminectomy with Fusion;  Surgeon: Consuella Lose, MD;  Location: [facility];  Service: Neurosurgery;  Laterality: N/A;  Cervical Three-Cervical Seven Posterior Cervical Laminectomy with Fusion    Family History  Problem Relation Age of Onset  . Cancer Other   . Diabetes Other       Social History   Socioeconomic History  . Marital status: Divorced    Spouse name: Not on file  . Number of children: Not on file  . Years of education: Not on file  . Highest education level: Not on file  Occupational History  . Not on file  Social Needs  . Financial resource strain: Not on file  . Food insecurity:    Worry: Not on file    Inability: Not on file  . Transportation needs:    Medical: Not on file    Non-medical: Not on file  Tobacco Use  . Smoking status: Former Smoker    Packs/day: 3.00    Types: Cigarettes  . Smokeless tobacco: Never Used  Substance and Sexual Activity  . Alcohol use: No  . Drug use: No  . Sexual activity: Not on file  Lifestyle  . Physical activity:    Days per week: Not on file    Minutes per session: Not on file  . Stress: Not on file  Relationships  . Social connections:    Talks on phone: Not on file    Gets together: Not on file    Attends religious service: Not on file    Active member of club or organization: Not on file    Attends meetings of clubs or organizations: Not on file    Relationship status: Not on file  Other Topics Concern  . Not on file  Social History Narrative  . Not on file    No Active Allergies   Current  Outpatient Medications:  .  acetaminophen (TYLENOL) 325 MG tablet, Take 650 mg by mouth every 4 (four) hours as needed for moderate pain., Disp: , Rfl:  .  amLODipine (NORVASC) 5 MG tablet, Take 1 tablet (5 mg total) by mouth daily., Disp: , Rfl:  .  amoxicillin-clavulanate (AUGMENTIN) 875-125 MG tablet, Take 1 tablet by mouth every 12 (twelve) hours., Disp: 60 tablet, Rfl: 2 .  atorvastatin (LIPITOR) 20 MG tablet, Take 20 mg by mouth at bedtime., Disp: , Rfl:  .  bisacodyl (DULCOLAX) 10 MG  suppository, Place 1 suppository (10 mg total) rectally daily as needed for moderate constipation., Disp: 12 suppository, Rfl: 0 .  buPROPion (WELLBUTRIN XL) 300 MG 24 hr tablet, Take 300 mg by mouth at bedtime., Disp: , Rfl:  .  calcium carbonate (TUMS - DOSED IN MG ELEMENTAL CALCIUM) 500 MG chewable tablet, Chew 2 tablets by mouth every 8 (eight) hours as needed for indigestion or heartburn., Disp: , Rfl:  .  cetirizine (ZYRTEC) 10 MG tablet, Take 10 mg by mouth daily., Disp: , Rfl:  .  Cholecalciferol (VITAMIN D-3) 5000 units TABS, Take 5,000 Units by mouth daily., Disp: , Rfl:  .  diphenhydrAMINE (BENADRYL) 25 MG tablet, Take 1 tablet (25 mg total) by mouth every 6 (six) hours., Disp: 12 tablet, Rfl: 0 .  eszopiclone (LUNESTA) 2 MG TABS tablet, Take 2 mg by mouth at bedtime. Take immediately before bedtime, Disp: , Rfl:  .  finasteride (PROSCAR) 5 MG tablet, Take 5 mg by mouth daily., Disp: , Rfl:  .  furosemide (LASIX) 20 MG tablet, Take 20 mg by mouth daily. , Disp: , Rfl:  .  glipiZIDE (GLUCOTROL) 10 MG tablet, Take 10 mg by mouth daily before breakfast., Disp: , Rfl:  .  glucagon (GLUCAGON EMERGENCY) 1 MG injection, Inject 1 mg into the muscle once as needed (for CBG <70). , Disp: , Rfl:  .  guaiFENesin (MUCINEX) 600 MG 12 hr tablet, Take 600 mg by mouth every 12 (twelve) hours as needed for cough (congestion). , Disp: , Rfl:  .  insulin lispro (HUMALOG KWIKPEN) 100 UNIT/ML KwikPen, Inject 0-10 Units into  the skin See admin instructions. Inject per sliding scale 3 times daily before meals; CBG <60 = notify MD, 0-150 = 0 units, 151-200 = 2 units, 201-250 = 4 units, 251-300 = 6 units, 301-350 = 8 units, 351-400 = 10 units, >400 = notify MD, Disp: , Rfl:  .  ipratropium-albuterol (DUONEB) 0.5-2.5 (3) MG/3ML SOLN, Take 3 mLs by nebulization every 6 (six) hours as needed (congestion/SOB)., Disp: , Rfl:  .  lidocaine (LIDODERM) 5 %, Place 1 patch onto the skin daily. Remove & Discard patch within 12 hours or as directed by MD, Disp: , Rfl:  .  magnesium oxide (MAG-OX) 400 MG tablet, Take 400 mg by mouth 2 (two) times daily., Disp: , Rfl:  .  metoprolol tartrate (LOPRESSOR) 25 MG tablet, Take 25 mg by mouth daily., Disp: , Rfl:  .  morphine (MS CONTIN) 15 MG 12 hr tablet, Take 15 mg by mouth every 12 (twelve) hours., Disp: , Rfl:  .  omeprazole (PRILOSEC) 20 MG capsule, Take 40 mg by mouth daily. , Disp: , Rfl:  .  ondansetron (ZOFRAN) 4 MG tablet, Take 4 mg by mouth every 6 (six) hours as needed for nausea or vomiting., Disp: , Rfl:  .  oxybutynin (DITROPAN XL) 15 MG 24 hr tablet, Take 15 mg by mouth at bedtime., Disp: , Rfl:  .  oxyCODONE (ROXICODONE) 15 MG immediate release tablet, Take 15 mg by mouth every 6 (six) hours as needed for pain., Disp: , Rfl:  .  PARoxetine (PAXIL) 20 MG tablet, Take 20 mg by mouth at bedtime., Disp: , Rfl:  .  polyethylene glycol (MIRALAX / GLYCOLAX) packet, Take 17 g by mouth daily as needed for mild constipation., Disp: 14 each, Rfl: 0 .  senna-docusate (SENOKOT-S) 8.6-50 MG tablet, Take 2 tablets by mouth at bedtime., Disp: , Rfl:  .  tamsulosin (FLOMAX) 0.4 MG  CAPS capsule, Take 0.8 mg by mouth every evening. , Disp: , Rfl:  .  tiZANidine (ZANAFLEX) 2 MG tablet, Take 2 mg by mouth every 8 (eight) hours as needed for muscle spasms., Disp: , Rfl:  .  vitamin B-12 (CYANOCOBALAMIN) 1000 MCG tablet, Take 1,000 mcg by mouth daily., Disp: , Rfl:  .  albuterol (PROVENTIL  HFA;VENTOLIN HFA) 108 (90 Base) MCG/ACT inhaler, Inhale 2 puffs into the lungs every 6 (six) hours as needed for wheezing or shortness of breath., Disp: , Rfl:  .  antiseptic oral rinse (BIOTENE) LIQD, 15 mLs by Mouth Rinse route every 4 (four) hours as needed for dry mouth. , Disp: , Rfl:  .  gemfibrozil (LOPID) 600 MG tablet, Take 600 mg by mouth every 12 (twelve) hours., Disp: , Rfl:  .  mometasone (NASONEX) 50 MCG/ACT nasal spray, Place 1 spray into the nose daily., Disp: , Rfl:  .  pregabalin (LYRICA) 150 MG capsule, Take 150 mg by mouth 2 (two) times daily., Disp: , Rfl:    Review of Systems  Constitutional: Negative for activity change, appetite change, chills, diaphoresis, fatigue, fever and unexpected weight change.  HENT: Negative for congestion, rhinorrhea, sinus pressure, sneezing, sore throat and trouble swallowing.   Eyes: Negative for photophobia and visual disturbance.  Respiratory: Negative for cough, chest tightness, shortness of breath, wheezing and stridor.   Cardiovascular: Negative for chest pain, palpitations and leg swelling.  Gastrointestinal: Negative for abdominal distention, abdominal pain, anal bleeding, blood in stool, constipation, diarrhea, nausea and vomiting.  Genitourinary: Negative for difficulty urinating, dysuria, flank pain and hematuria.  Musculoskeletal: Positive for joint swelling and myalgias. Negative for arthralgias, back pain and gait problem.  Skin: Positive for color change and wound. Negative for pallor and rash.  Neurological: Negative for dizziness, tremors, weakness and light-headedness.  Hematological: Negative for adenopathy. Does not bruise/bleed easily.  Psychiatric/Behavioral: Negative for agitation, behavioral problems, confusion, decreased concentration, dysphoric mood and sleep disturbance.       Objective:   Physical Exam Constitutional:      Appearance: He is well-developed.  HENT:     Head: Normocephalic and atraumatic.    Eyes:     Conjunctiva/sclera: Conjunctivae normal.  Neck:     Musculoskeletal: Normal range of motion and neck supple.  Cardiovascular:     Rate and Rhythm: Normal rate and regular rhythm.  Pulmonary:     Effort: Pulmonary effort is normal. No respiratory distress.     Breath sounds: No wheezing.  Abdominal:     General: There is no distension.     Palpations: Abdomen is soft.  Skin:    General: Skin is warm and dry.  Neurological:     Mental Status: He is alert and oriented to person, place, and time.  Psychiatric:        Mood and Affect: Mood normal.        Behavior: Behavior normal.        Thought Content: Thought content normal.        Judgment: Judgment normal.    Left lower extremity and Dynaflex bandage see pictures below there is necrosis of the adjacent toe I can only get a glimpse of the wound bed though I did see picture yesterday that [name] took it orthopedics see picture from her yesterday below and then my pictures from today  She is in [place] [facility] July 07, 2018:     Pictures today from our clinic July 08, 2018:  Assessment & Plan:   Osteomyelitis with sepsis due to Streptococcus Canis plus or minus anaerobes post first ray amputation now with necrosis of the adjacent digit  I worry that he does not have sufficient vascular supply to heal his operative wound and also worry about increasing necrosis of the adjacent digit  Need to be closely followed by orthopedic surgery I think he will at least need the third digit amputated as well.  Certainly may need a more proximal amputation as far as his prior can ray amputation though some of the tissue appears healthy I have concerned about that.  His pain seems improved but he still complaining about a fair amount of pain there including saying that he has 10 of 10 pain currently.  I was going to check labs today but he said he had them checked yesterday and the skilled nurse  facility.  I will continue him Augmentin for another month at minimum  See him again in roughly a month and I do not want him to come off antibiotics until have seen him.  History of MRSA bacteremia with discitis: Him to stay on chronic doxycycline due to placement of hardware in his C-spine.  He says that his neck is without any pain.  I will therefore not reinstitute this antibiotic at this point time will need to be mindful of this history.  I spent greater than 25 minutes with the patient including greater than 50% of time in face to face counsel of the patient guards to the nature of diabetic foot infections and osteomyelitis, how we manage these infections and multidisciplinary care and in coordination of his care.

## 2018-07-10 NOTE — Progress Notes (Signed)
Thank you for the information, I see ASAP

## 2018-07-10 NOTE — Progress Notes (Signed)
Lets see patient ASAP next week

## 2018-07-11 NOTE — Progress Notes (Signed)
Pt has an appt on monday

## 2018-07-14 ENCOUNTER — Ambulatory Visit (INDEPENDENT_AMBULATORY_CARE_PROVIDER_SITE_OTHER): Payer: Medicaid Other | Admitting: Physician Assistant

## 2018-07-14 ENCOUNTER — Encounter (INDEPENDENT_AMBULATORY_CARE_PROVIDER_SITE_OTHER): Payer: Self-pay | Admitting: Physician Assistant

## 2018-07-14 ENCOUNTER — Telehealth (INDEPENDENT_AMBULATORY_CARE_PROVIDER_SITE_OTHER): Payer: Self-pay | Admitting: Physician Assistant

## 2018-07-14 VITALS — Ht 72.0 in | Wt 254.0 lb

## 2018-07-14 DIAGNOSIS — I110 Hypertensive heart disease with heart failure: Secondary | ICD-10-CM

## 2018-07-14 DIAGNOSIS — Z89511 Acquired absence of right leg below knee: Secondary | ICD-10-CM

## 2018-07-14 DIAGNOSIS — E1142 Type 2 diabetes mellitus with diabetic polyneuropathy: Secondary | ICD-10-CM

## 2018-07-14 DIAGNOSIS — L97929 Non-pressure chronic ulcer of unspecified part of left lower leg with unspecified severity: Secondary | ICD-10-CM

## 2018-07-14 DIAGNOSIS — I87332 Chronic venous hypertension (idiopathic) with ulcer and inflammation of left lower extremity: Secondary | ICD-10-CM

## 2018-07-14 DIAGNOSIS — S98132A Complete traumatic amputation of one left lesser toe, initial encounter: Secondary | ICD-10-CM

## 2018-07-14 DIAGNOSIS — I5032 Chronic diastolic (congestive) heart failure: Secondary | ICD-10-CM

## 2018-07-14 DIAGNOSIS — Z794 Long term (current) use of insulin: Secondary | ICD-10-CM

## 2018-07-14 DIAGNOSIS — Z89422 Acquired absence of other left toe(s): Secondary | ICD-10-CM

## 2018-07-14 NOTE — Telephone Encounter (Signed)
Wash the foot daily with soap and water and then pack the open area over the amputation site with dry gauze and dry gauze over the remainder of the toes, then Ace wrap daily for compression. The orders were sent with the patient to the nursing facility and are on his pink consultation sheet from today.  BK amputation discussed with the patient today, but he is not ready to give up on saving the left foot yet.

## 2018-07-14 NOTE — Telephone Encounter (Signed)
Pt is s/p a 1st and 2nd ray amputation in office today. Any updated wound care orders for this pt?

## 2018-07-14 NOTE — Telephone Encounter (Signed)
Brittney from [facility] called needing clarification on wound care orders. The number to contact Gari Crown is [phone]

## 2018-07-16 ENCOUNTER — Encounter (INDEPENDENT_AMBULATORY_CARE_PROVIDER_SITE_OTHER): Payer: Self-pay | Admitting: Physician Assistant

## 2018-07-16 NOTE — Progress Notes (Signed)
Office Visit Note   Patient: Gregory [name]           Date of Birth: [birth year]           MRN: [id number] Visit Date: 07/14/2018              Requested by: [name], MD [place], [place] [id number] PCP: [name], MD  Chief Complaint  Patient presents with  . Left Foot - Routine Post Op    06/06/18 left foot 1st and 2nd ray amputation       HPI: The patient is a 58 year old gentleman here with his daughter for postoperative follow-up following left foot first and second ray amputations on 06/06/2018.  He is in skilled nursing for rehabilitation.  They have been attempting to apply Dynaflex compression wraps and skilled nursing due to significant edema of the left lower extremity and poor healing of the incisional area.  He continues on Augmentin per Dr. Drucilla Schmidt of infectious disease.  He is continuing to show poor healing over the left foot.  Assessment & Plan: Visit Diagnoses:  1. Amputated toe of left foot ([place])   2. Idiopathic chronic venous hypertension of left lower extremity with ulcer and inflammation (HCC)   3. Type 2 diabetes mellitus with diabetic polyneuropathy, with long-term current use of insulin ([place])   4. Hypertensive heart disease with chronic diastolic congestive heart failure ([place])   5. Acquired absence of right leg below knee [facility])     Plan: [name] discussed that the patient will be unlikely to heal the left foot.  He discussed treatment options including a left transtibial amputation at length with the patient.  The patient is status post a right transtibial amputation and reports severe neuropathic pain since this amputation was done despite multiple modalities and medications and is currently very resistant to proceeding with a left below the knee amputation.  We will continue to have the nursing facility wash the left foot daily with Dial soap and water packed the wound with dry gauze and Ace wrap for edema control.  He should  continue on his Augmentin.  He will follow-up here in 1 week.  Follow-Up Instructions: Return in about 1 week (around 07/21/2018).   Ortho Exam  Patient is alert, oriented, no adenopathy, well-dressed, normal affect, normal respiratory effort. Left first and second ray amputation sites show dehiscence and significant edema of the foot.  The third toe continues to have gangrenous changes.  He has edema throughout the left lower extremity particularly below the knee.  He does have palpable pulses however.   Imaging: No results found. No images are attached to the encounter.  Labs: Lab Results  Component Value Date   HGBA1C 8.0 (H) 06/04/2018   HGBA1C 7.9 (H) 04/25/2018   HGBA1C 7.2 (H) 03/25/2017   ESRSEDRATE 120 (H) 03/25/2017   ESRSEDRATE 84 (H) 10/02/2016   ESRSEDRATE 132 (H) 12/01/2015   CRP 4.9 (H) 03/25/2017   CRP 30.6 (H) 10/02/2016   CRP 13.7 (H) 11/11/2015   REPTSTATUS 06/11/2018 FINAL 06/06/2018   GRAMSTAIN  06/06/2018    RARE WBC PRESENT, PREDOMINANTLY PMN FEW GRAM POSITIVE COCCI    CULT  06/06/2018    MODERATE STREPTOCOCCUS CANIS NO ANAEROBES ISOLATED Performed at [facility], [place] [address], [place], [place] [address]    LABORGA PROVIDENCIA [name] (A) 01/12/2017     Lab Results  Component Value Date   ALBUMIN 2.2 (L) 06/04/2018   ALBUMIN 2.8 (  L) 06/03/2018   ALBUMIN 3.2 (L) 03/28/2017   PREALBUMIN 23.3 03/25/2017    Body mass index is 34.45 kg/m.  Orders:  No orders of the defined types were placed in this encounter.  No orders of the defined types were placed in this encounter.    Procedures: No procedures performed  Clinical Data: No additional findings.  ROS:  All other systems negative, except as noted in the HPI. Review of Systems  Objective: Vital Signs: Ht 6' (1.829 m)   Wt 254 lb (115.2 kg)   BMI 34.45 kg/m   Specialty Comments:  No specialty comments available.  PMFS History: Patient Active Problem List    Diagnosis Date Noted  . Necrosis of toe ([place]) 07/08/2018  . Amputated toe of left foot ([place]) 06/18/2018  . Streptococcal infection   . Pressure injury of skin 06/06/2018  . Cutaneous abscess of left foot   . Severe protein-calorie malnutrition ([place])   . Cellulitis of left leg   . Septic arthritis ([place]) 06/03/2018  . Idiopathic chronic venous hypertension of left lower extremity with ulcer and inflammation ([place]) 11/14/2017  . Great toe amputation status, left 11/14/2017  . Acquired absence of right leg below knee ([place]) 06/20/2017  . Osteomyelitis ([place]) 03/25/2017  . Skin cancer 03/25/2017  . Depression, recurrent ([place]) 11/05/2016  . Diabetic ulcer of toe of left foot ([place]) 10/02/2016  . 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 Chronic pain 09/27/2016  . Dyslipidemia associated with type 2 diabetes mellitus ([place]) 06/06/2016  . GERD without esophagitis 05/11/2016  . Hypertensive heart disease with congestive heart failure ([place]) 12/15/2015  . Spinal stenosis in cervical region 12/15/2015  . Type II diabetes mellitus with neurological manifestations ([place]) 12/15/2015  . Chronic diastolic CHF (congestive heart failure) ([place])   . Pulmonary hypertension ([place])   . Urinary retention   . Diskitis   . Foot drop, bilateral   . Sepsis with acute renal failure without septic shock ([place])   . MRSA bacteremia 10/03/2015  . COPD (chronic obstructive pulmonary disease) ([place]) 10/03/2015  . Acute osteomyelitis of left foot ([place]) 10/03/2015   Past Medical History:  Diagnosis Date  . Acquired absence of left great toe ([place])   . Acquired absence of right leg below knee ([place])   . Acute osteomyelitis of calcaneum, right ([place]) 10/02/2016  . Acute respiratory failure ([place])   . Anemia   . Basal cell carcinoma, eyelid   . Cancer ([place])   . CHF (congestive heart failure) (HCC)    chronic diastolic   . Chronic back pain   . Chronic kidney disease    stage 3   . Chronic multifocal osteomyelitis (HCC)    of ankle  and foot   . Chronic pain syndrome   . COPD (chronic obstructive pulmonary disease) ([place])   . DDD (degenerative disc disease), lumbar   . Depression    major depressive disorder   . Diabetes mellitus without complication ([place])    type 2   . Diabetic ulcer of toe of left foot ([place]) 10/02/2016  . Difficulty in walking, not elsewhere classified   . 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 Foot drop, bilateral   . Foot drop, right 12/27/2015  . GERD (gastroesophageal reflux disease)   . Herpesviral vesicular dermatitis   . Hyperlipidemia   . Hyperlipidemia   . Hypertension   . Insomnia   . Malignant melanoma of other parts of face ([place])   . MRSA bacteremia   . Muscle weakness (generalized)   .  Nasal congestion   . Necrosis of toe ([place]) 07/08/2018  . Neuromuscular dysfunction of bladder   . Osteomyelitis ([place])   . Osteomyelitis ([place])   . Other idiopathic peripheral autonomic neuropathy   . Retention of urine, unspecified   . Squamous cell carcinoma of skin   . Unsteadiness on feet   . Urinary retention   . Urinary retention   . Urinary tract infection     Family History  Problem Relation Age of Onset  . Cancer Other   . Diabetes Other     Past Surgical History:  Procedure Laterality Date  . AMPUTATION Left 03/29/2017   Procedure: LEFT GREAT TOE AMPUTATION AT METATARSOPHALANGEAL JOINT;  Surgeon: [name], MD;  Location: [facility];  Service: Orthopedics;  Laterality: Left;  . AMPUTATION Right 03/29/2017   Procedure: RIGHT BELOW KNEE AMPUTATION;  Surgeon: [name], MD;  Location: [facility];  Service: Orthopedics;  Laterality: Right;  . AMPUTATION Left 06/06/2018   Procedure: LEFT 2ND TOE AMPUTATION;  Surgeon: [name], MD;  Location: [facility];  Service: Orthopedics;  Laterality: Left;  . ANTERIOR CERVICAL CORPECTOMY N/A 11/25/2015   Procedure: ANTERIOR CERVICAL FIVE CORPECTOMY Cervical four - six fusion;  Surgeon: Consuella Lose, MD;  Location: [facility];  Service: Neurosurgery;   Laterality: N/A;  ANTERIOR CERVICAL FIVE CORPECTOMY Cervical four - six fusion  . CYSTOSCOPY WITH RETROGRADE URETHROGRAM N/A 04/25/2018   Procedure: CYSTOSCOPY WITH RETROGRADE URETHROGRAM/ BALLOON DILATION;  Surgeon: Kathie Rhodes, MD;  Location: [facility];  Service: Urology;  Laterality: N/A;  . POSTERIOR CERVICAL FUSION/FORAMINOTOMY N/A 11/29/2015   Procedure: Cervical Three-Cervical Seven Posterior Cervical Laminectomy with Fusion;  Surgeon: Consuella Lose, MD;  Location: [facility];  Service: Neurosurgery;  Laterality: N/A;  Cervical Three-Cervical Seven Posterior Cervical Laminectomy with Fusion   Social History   Occupational History  . Not on file  Tobacco Use  . Smoking status: Former Smoker    Packs/day: 3.00    Types: Cigarettes  . Smokeless tobacco: Never Used  Substance and Sexual Activity  . Alcohol use: No  . Drug use: No  . Sexual activity: Not on file

## 2018-07-21 ENCOUNTER — Encounter (INDEPENDENT_AMBULATORY_CARE_PROVIDER_SITE_OTHER): Payer: Self-pay | Admitting: Physician Assistant

## 2018-07-21 ENCOUNTER — Ambulatory Visit (INDEPENDENT_AMBULATORY_CARE_PROVIDER_SITE_OTHER): Payer: Medicaid Other | Admitting: Orthopedic Surgery

## 2018-07-21 VITALS — Ht 72.0 in | Wt 254.0 lb

## 2018-07-21 DIAGNOSIS — S98132A Complete traumatic amputation of one left lesser toe, initial encounter: Secondary | ICD-10-CM

## 2018-07-21 DIAGNOSIS — L97829 Non-pressure chronic ulcer of other part of left lower leg with unspecified severity: Secondary | ICD-10-CM | POA: Diagnosis not present

## 2018-07-21 DIAGNOSIS — Z4781 Encounter for orthopedic aftercare following surgical amputation: Secondary | ICD-10-CM

## 2018-07-21 DIAGNOSIS — I87332 Chronic venous hypertension (idiopathic) with ulcer and inflammation of left lower extremity: Secondary | ICD-10-CM | POA: Diagnosis not present

## 2018-07-21 DIAGNOSIS — Z89412 Acquired absence of left great toe: Secondary | ICD-10-CM

## 2018-07-21 DIAGNOSIS — L97929 Non-pressure chronic ulcer of unspecified part of left lower leg with unspecified severity: Secondary | ICD-10-CM

## 2018-07-21 DIAGNOSIS — Z89422 Acquired absence of other left toe(s): Secondary | ICD-10-CM

## 2018-07-21 NOTE — Progress Notes (Addendum)
Office Visit Note   Patient: Gregory [name]           Date of Birth: March 26, 1961           MRN: [id number] Visit Date: 07/21/2018              Requested by: [name], MD [place], [place] [id number] PCP: [name], MD  Chief Complaint  Patient presents with  . Left Foot - Routine Post Op    06/06/18 left foot 1st and 2nd ray amputation         HPI: Patient is a 58 year old gentleman who is status post great toe and second toe amputation left foot with venous insufficiency.  Patient is currently at skilled nursing they are currently doing dry dressing changes with an Ace wrap.  Patient refused the application of the compression of the layer of the Dynaflex wrap.  Assessment & Plan: Visit Diagnoses:  1. Amputated toe of left foot ([place])   2. Idiopathic chronic venous hypertension of left lower extremity with ulcer and inflammation ([place])     Plan: We will apply Iodosorb 4 x 4 and a Dynaflex wrap.  Discussed the importance of elevation compression cleansing and orders written for skilled nursing to apply Silvadene plus a dry dressing plus compression wrap.  Follow-Up Instructions: Return in about 2 weeks (around 08/04/2018).   Ortho Exam  Patient is alert, oriented, no adenopathy, well-dressed, normal affect, normal respiratory effort. Examination patient has decreased venous stasis swelling in the left leg.  The wound bed is significantly improved there is no drainage no odor after debridement of a thin layer fibrinous exudative tissue the wound bed is approximately 75% beefy granulation tissue.  Patient has ischemic changes to the tip of the third toe and clinically this looks like it will just auto amputate.  Imaging: No results found. No images are attached to the encounter.  Labs: Lab Results  Component Value Date   HGBA1C 8.0 (H) 06/04/2018   HGBA1C 7.9 (H) 04/25/2018   HGBA1C 7.2 (H) 03/25/2017   ESRSEDRATE 120 (H) 03/25/2017   ESRSEDRATE 84 (H) 10/02/2016   ESRSEDRATE 132 (H) 12/01/2015   CRP 4.9 (H) 03/25/2017   CRP 30.6 (H) 10/02/2016   CRP 13.7 (H) 11/11/2015   REPTSTATUS 06/11/2018 FINAL 06/06/2018   GRAMSTAIN  06/06/2018    RARE WBC PRESENT, PREDOMINANTLY PMN FEW GRAM POSITIVE COCCI    CULT  06/06/2018    MODERATE STREPTOCOCCUS CANIS NO ANAEROBES ISOLATED Performed at [facility], [place] [address] [place], [place] [address]    LABORGA PROVIDENCIA [name] (A) 01/12/2017     Lab Results  Component Value Date   ALBUMIN 2.2 (L) 06/04/2018   ALBUMIN 2.8 (L) 06/03/2018   ALBUMIN 3.2 (L) 03/28/2017   PREALBUMIN 23.3 03/25/2017    Body mass index is 34.45 kg/m.  Orders:  No orders of the defined types were placed in this encounter.  No orders of the defined types were placed in this encounter.    Procedures: No procedures performed  Clinical Data: No additional findings.  ROS:  All other systems negative, except as noted in the HPI. Review of Systems  Objective: Vital Signs: Ht 6' (1.829 m)   Wt 254 lb (115.2 kg)   BMI 34.45 kg/m   Specialty Comments:  No specialty comments available.  PMFS History: Patient Active Problem List   Diagnosis Date Noted  . Necrosis of toe ([place]) 07/08/2018  . Amputated toe of  left foot ([place]) 06/18/2018  . Streptococcal infection   . Pressure injury of skin 06/06/2018  . Cutaneous abscess of left foot   . Severe protein-calorie malnutrition ([place])   . Cellulitis of left leg   . Septic arthritis ([place]) 06/03/2018  . Idiopathic chronic venous hypertension of left lower extremity with ulcer and inflammation ([place]) 11/14/2017  . Great toe amputation status, left 11/14/2017  . Acquired absence of right leg below knee ([place]) 06/20/2017  . Osteomyelitis ([place]) 03/25/2017  . Skin cancer 03/25/2017  . Depression, recurrent ([place]) 11/05/2016  . Diabetic ulcer of toe of left foot ([place]) 10/02/2016  . 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 Chronic pain  09/27/2016  . Dyslipidemia associated with type 2 diabetes mellitus ([place]) 06/06/2016  . GERD without esophagitis 05/11/2016  . Hypertensive heart disease with congestive heart failure ([place]) 12/15/2015  . Spinal stenosis in cervical region 12/15/2015  . Type II diabetes mellitus with neurological manifestations ([place]) 12/15/2015  . Chronic diastolic CHF (congestive heart failure) ([place])   . Pulmonary hypertension ([place])   . Urinary retention   . Diskitis   . Foot drop, bilateral   . Sepsis with acute renal failure without septic shock ([place])   . MRSA bacteremia 10/03/2015  . COPD (chronic obstructive pulmonary disease) ([place]) 10/03/2015  . Acute osteomyelitis of left foot ([place]) 10/03/2015   Past Medical History:  Diagnosis Date  . Acquired absence of left great toe ([place])   . Acquired absence of right leg below knee ([place])   . Acute osteomyelitis of calcaneum, right ([place]) 10/02/2016  . Acute respiratory failure ([place])   . Anemia   . Basal cell carcinoma, eyelid   . Cancer ([place])   . CHF (congestive heart failure) (HCC)    chronic diastolic   . Chronic back pain   . Chronic kidney disease    stage 3   . Chronic multifocal osteomyelitis (HCC)    of ankle and foot   . Chronic pain syndrome   . COPD (chronic obstructive pulmonary disease) ([place])   . DDD (degenerative disc disease), lumbar   . Depression    major depressive disorder   . Diabetes mellitus without complication ([place])    type 2   . Diabetic ulcer of toe of left foot ([place]) 10/02/2016  . Difficulty in walking, not elsewhere classified   . 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 Foot drop, bilateral   . Foot drop, right 12/27/2015  . GERD (gastroesophageal reflux disease)   . Herpesviral vesicular dermatitis   . Hyperlipidemia   . Hyperlipidemia   . Hypertension   . Insomnia   . Malignant melanoma of other parts of face ([place])   . MRSA bacteremia   . Muscle weakness (generalized)   . Nasal congestion   . Necrosis of toe ([place])  07/08/2018  . Neuromuscular dysfunction of bladder   . Osteomyelitis ([place])   . Osteomyelitis ([place])   . Other idiopathic peripheral autonomic neuropathy   . Retention of urine, unspecified   . Squamous cell carcinoma of skin   . Unsteadiness on feet   . Urinary retention   . Urinary retention   . Urinary tract infection     Family History  Problem Relation Age of Onset  . Cancer Other   . Diabetes Other     Past Surgical History:  Procedure Laterality Date  . AMPUTATION Left 03/29/2017   Procedure: LEFT GREAT TOE AMPUTATION AT METATARSOPHALANGEAL JOINT;  Surgeon: [name], MD;  Location: [facility];  Service: Orthopedics;  Laterality: Left;  . AMPUTATION Right 03/29/2017   Procedure: RIGHT BELOW KNEE AMPUTATION;  Surgeon: [name], MD;  Location: [facility];  Service: Orthopedics;  Laterality: Right;  . AMPUTATION Left 06/06/2018   Procedure: LEFT 2ND TOE AMPUTATION;  Surgeon: [name], MD;  Location: [facility];  Service: Orthopedics;  Laterality: Left;  . ANTERIOR CERVICAL CORPECTOMY N/A 11/25/2015   Procedure: ANTERIOR CERVICAL FIVE CORPECTOMY Cervical four - six fusion;  Surgeon: Consuella Lose, MD;  Location: [facility];  Service: Neurosurgery;  Laterality: N/A;  ANTERIOR CERVICAL FIVE CORPECTOMY Cervical four - six fusion  . CYSTOSCOPY WITH RETROGRADE URETHROGRAM N/A 04/25/2018   Procedure: CYSTOSCOPY WITH RETROGRADE URETHROGRAM/ BALLOON DILATION;  Surgeon: Kathie Rhodes, MD;  [facility];  Service: Urology;  Laterality: N/A;  . POSTERIOR CERVICAL FUSION/FORAMINOTOMY N/A 11/29/2015   Procedure: Cervical Three-Cervical Seven Posterior Cervical Laminectomy with Fusion;  Surgeon: Consuella Lose, MD;  Location: [facility];  Service: Neurosurgery;  Laterality: N/A;  Cervical Three-Cervical Seven Posterior Cervical Laminectomy with Fusion   Social History   Occupational History  . Not on file  Tobacco Use  . Smoking status: Former Smoker    Packs/day: 3.00    Types:  Cigarettes  . Smokeless tobacco: Never Used  Substance and Sexual Activity  . Alcohol use: No  . Drug use: No  . Sexual activity: Not on file

## 2018-08-04 ENCOUNTER — Encounter (INDEPENDENT_AMBULATORY_CARE_PROVIDER_SITE_OTHER): Payer: Self-pay | Admitting: Orthopedic Surgery

## 2018-08-04 ENCOUNTER — Ambulatory Visit (INDEPENDENT_AMBULATORY_CARE_PROVIDER_SITE_OTHER): Payer: Medicaid Other | Admitting: Physician Assistant

## 2018-08-04 VITALS — Ht 72.0 in | Wt 254.0 lb

## 2018-08-04 DIAGNOSIS — E1142 Type 2 diabetes mellitus with diabetic polyneuropathy: Secondary | ICD-10-CM

## 2018-08-04 DIAGNOSIS — L97929 Non-pressure chronic ulcer of unspecified part of left lower leg with unspecified severity: Secondary | ICD-10-CM

## 2018-08-04 DIAGNOSIS — I5032 Chronic diastolic (congestive) heart failure: Secondary | ICD-10-CM

## 2018-08-04 DIAGNOSIS — Z794 Long term (current) use of insulin: Secondary | ICD-10-CM

## 2018-08-04 DIAGNOSIS — Z89511 Acquired absence of right leg below knee: Secondary | ICD-10-CM

## 2018-08-04 DIAGNOSIS — I110 Hypertensive heart disease with heart failure: Secondary | ICD-10-CM

## 2018-08-04 DIAGNOSIS — I87332 Chronic venous hypertension (idiopathic) with ulcer and inflammation of left lower extremity: Secondary | ICD-10-CM

## 2018-08-04 DIAGNOSIS — S98132A Complete traumatic amputation of one left lesser toe, initial encounter: Secondary | ICD-10-CM

## 2018-08-04 DIAGNOSIS — Z89422 Acquired absence of other left toe(s): Secondary | ICD-10-CM

## 2018-08-04 NOTE — Progress Notes (Signed)
Office Visit Note   Patient: Gregory [name]           Date of Birth: 1960-08-25           MRN: [id number] Visit Date: 08/04/2018              Requested by: [name], MD [place], [place] [address] PCP: [name], MD  Chief Complaint  Patient presents with  . Left Foot - Routine Post Op    06/06/18 1st &2nd Toe amp  . Right Leg - Follow-up    BKA stump infected      HPI: The patient is a 58 year old gentleman who is here for postoperative follow-up following left left foot first and second toe amputations for osteomyelitis on 06/06/2018.  He remains in skilled nursing for rehabilitation.  He comes in today complaining of severe pain over his right transtibial amputation site and reports that several nights ago he had drainage from the distal residual limb which squirted up and drained copious amounts.  He reports that the pain is very severe and the right leg today and he requesst a cortisone injection in the muscle.  We discussed there would be no benefit from cortisone at this time.  He demand demands some type of a pain shot in the clinic today..  We discussed that the only pain medication we had in the clinic is Toradol and that with his kidney disease and this would be ill advised.  He demanded injection and reported that he would be willing to take the risk of worsening kidney function for some pain relief.  He is continued to have drainage from his left first and second toe amputation site and significant edema over the foot.  The nursing facility has been placing Silvadene to the foot and applying a partial compression wrap to the leg as he has been resistant to a full Dynaflex wrap.  Assessment & Plan: Visit Diagnoses:  1. S/P below knee amputation, right ([place])   2. Amputated toe of left foot ([place])   3. Type 2 diabetes mellitus with diabetic polyneuropathy, with long-term current use of insulin ([place])   4. Hypertensive heart disease with chronic  diastolic congestive heart failure ([place])   5. Idiopathic chronic venous hypertension of left lower extremity with ulcer and inflammation (HCC)     Plan: The patient was given Toradol 30 mg IM x1 here in the clinic today after again discussing with the patient that this may cause decline in his renal function.  He stated he was willing to take the risk of declining renal function with the Toradol in order to get some increased pain relief. With new drainage, increased pain and signs of infection in the right transtibial amputation site, [name] advised the patient the patient revision of the right BKA was indicated at this time and should be done as soon as possible.  The patient reported he would not K[place] not have any further surgery and was not interested in having any further surgery at this time.  [name] reiterated with the patient that there is signs of infection in the right transtibial amputation site and that this is likely going to respond poorly to oral antibiotics but again the patient declined surgery at this time.  The nursing facility can apply a dry dressing to the right below the knee amputation and Ace wrapping.  Continue Silvadene and Dynaflex wrap to the left lower extremity.  He will follow-up in 1  week or sooner should he develop further difficulties in the interim.  Follow-Up Instructions: Return in about 1 week (around 08/11/2018).   Ortho Exam  Patient is alert, oriented, no adenopathy, well-dressed, normal affect, normal respiratory effort. The right transtibial amputation has a new wound over the distal residual limb.  This was opened further by [name] and copious amounts of gelatinous thick white drainage was obtained.  There is erythema over the area and the patient complains of severe pain over the area with palpation.  The drainage was cultured. There continues to be dehiscence of the amputation site on the left second and first toe with foul drainage erythema and  edema.  Imaging: No results found.   Labs: Lab Results  Component Value Date   HGBA1C 8.0 (H) 06/04/2018   HGBA1C 7.9 (H) 04/25/2018   HGBA1C 7.2 (H) 03/25/2017   ESRSEDRATE 120 (H) 03/25/2017   ESRSEDRATE 84 (H) 10/02/2016   ESRSEDRATE 132 (H) 12/01/2015   CRP 4.9 (H) 03/25/2017   CRP 30.6 (H) 10/02/2016   CRP 13.7 (H) 11/11/2015   REPTSTATUS 06/11/2018 FINAL 06/06/2018   GRAMSTAIN  06/06/2018    RARE WBC PRESENT, PREDOMINANTLY PMN FEW GRAM POSITIVE COCCI    CULT  06/06/2018    MODERATE STREPTOCOCCUS CANIS NO ANAEROBES ISOLATED Performed at [facility], [place] [address] [place], [place] [address]    LABORGA PROVIDENCIA [name] (A) 01/12/2017     Lab Results  Component Value Date   ALBUMIN 2.2 (L) 06/04/2018   ALBUMIN 2.8 (L) 06/03/2018   ALBUMIN 3.2 (L) 03/28/2017   PREALBUMIN 23.3 03/25/2017    Body mass index is 34.45 kg/m.  Orders:  Orders Placed This Encounter  Procedures  . Wound culture   No orders of the defined types were placed in this encounter.    Procedures: No procedures performed  Clinical Data: No additional findings.  ROS:  All other systems negative, except as noted in the HPI. Review of Systems  Objective: Vital Signs: Ht 6' (1.829 m)   Wt 254 lb (115.2 kg)   BMI 34.45 kg/m   Specialty Comments:  No specialty comments available.  PMFS History: Patient Active Problem List   Diagnosis Date Noted  . Necrosis of toe ([place]) 07/08/2018  . Amputated toe of left foot ([place]) 06/18/2018  . Streptococcal infection   . Pressure injury of skin 06/06/2018  . Cutaneous abscess of left foot   . Severe protein-calorie malnutrition ([place])   . Cellulitis of left leg   . Septic arthritis ([place]) 06/03/2018  . Idiopathic chronic venous hypertension of left lower extremity with ulcer and inflammation ([place]) 11/14/2017  . Great toe amputation status, left 11/14/2017  . Acquired absence of right leg below knee ([place]) 06/20/2017  .  Osteomyelitis ([place]) 03/25/2017  . Skin cancer 03/25/2017  . Depression, recurrent ([place]) 11/05/2016  . Diabetic ulcer of toe of left foot ([place]) 10/02/2016  . 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 Chronic pain 09/27/2016  . Dyslipidemia associated with type 2 diabetes mellitus ([place]) 06/06/2016  . GERD without esophagitis 05/11/2016  . Hypertensive heart disease with congestive heart failure ([place]) 12/15/2015  . Spinal stenosis in cervical region 12/15/2015  . Type II diabetes mellitus with neurological manifestations ([place]) 12/15/2015  . Chronic diastolic CHF (congestive heart failure) ([place])   . Pulmonary hypertension ([place])   . Urinary retention   . Diskitis   . Foot drop, bilateral   . Sepsis with acute renal failure without septic shock ([place])   .  MRSA bacteremia 10/03/2015  . COPD (chronic obstructive pulmonary disease) ([place]) 10/03/2015  . Acute osteomyelitis of left foot ([place]) 10/03/2015   Past Medical History:  Diagnosis Date  . Acquired absence of left great toe ([place])   . Acquired absence of right leg below knee ([place])   . Acute osteomyelitis of calcaneum, right ([place]) 10/02/2016  . Acute respiratory failure ([place])   . Anemia   . Basal cell carcinoma, eyelid   . Cancer ([place])   . CHF (congestive heart failure) (HCC)    chronic diastolic   . Chronic back pain   . Chronic kidney disease    stage 3   . Chronic multifocal osteomyelitis (HCC)    of ankle and foot   . Chronic pain syndrome   . COPD (chronic obstructive pulmonary disease) ([place])   . DDD (degenerative disc disease), lumbar   . Depression    major depressive disorder   . Diabetes mellitus without complication ([place])    type 2   . Diabetic ulcer of toe of left foot ([place]) 10/02/2016  . Difficulty in walking, not elsewhere classified   . 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 Foot drop, bilateral   . Foot drop, right 12/27/2015  . GERD (gastroesophageal reflux disease)   . Herpesviral vesicular dermatitis   . Hyperlipidemia   .  Hyperlipidemia   . Hypertension   . Insomnia   . Malignant melanoma of other parts of face ([place])   . MRSA bacteremia   . Muscle weakness (generalized)   . Nasal congestion   . Necrosis of toe ([place]) 07/08/2018  . Neuromuscular dysfunction of bladder   . Osteomyelitis ([place])   . Osteomyelitis ([place])   . Other idiopathic peripheral autonomic neuropathy   . Retention of urine, unspecified   . Squamous cell carcinoma of skin   . Unsteadiness on feet   . Urinary retention   . Urinary retention   . Urinary tract infection     Family History  Problem Relation Age of Onset  . Cancer Other   . Diabetes Other     Past Surgical History:  Procedure Laterality Date  . AMPUTATION Left 03/29/2017   Procedure: LEFT GREAT TOE AMPUTATION AT METATARSOPHALANGEAL JOINT;  Surgeon: [name], MD;  Location: [facility];  Service: Orthopedics;  Laterality: Left;  . AMPUTATION Right 03/29/2017   Procedure: RIGHT BELOW KNEE AMPUTATION;  Surgeon: [name], MD;  Location: [facility];  Service: Orthopedics;  Laterality: Right;  . AMPUTATION Left 06/06/2018   Procedure: LEFT 2ND TOE AMPUTATION;  Surgeon: [name], MD;  Location: [facility];  Service: Orthopedics;  Laterality: Left;  . ANTERIOR CERVICAL CORPECTOMY N/A 11/25/2015   Procedure: ANTERIOR CERVICAL FIVE CORPECTOMY Cervical four - six fusion;  Surgeon: Consuella Lose, MD;  Location: [facility];  Service: Neurosurgery;  Laterality: N/A;  ANTERIOR CERVICAL FIVE CORPECTOMY Cervical four - six fusion  . CYSTOSCOPY WITH RETROGRADE URETHROGRAM N/A 04/25/2018   Procedure: CYSTOSCOPY WITH RETROGRADE URETHROGRAM/ BALLOON DILATION;  Surgeon: Kathie Rhodes, MD;  Location: [facility];  Service: Urology;  Laterality: N/A;  . POSTERIOR CERVICAL FUSION/FORAMINOTOMY N/A 11/29/2015   Procedure: Cervical Three-Cervical Seven Posterior Cervical Laminectomy with Fusion;  Surgeon: Consuella Lose, MD;  Location: [facility];  Service: Neurosurgery;  Laterality: N/A;   Cervical Three-Cervical Seven Posterior Cervical Laminectomy with Fusion   Social History   Occupational History  . Not on file  Tobacco Use  . Smoking status: Former Smoker    Packs/day:  3.00    Types: Cigarettes  . Smokeless tobacco: Never Used  Substance and Sexual Activity  . Alcohol use: No  . Drug use: No  . Sexual activity: Not on file

## 2018-08-07 LAB — WOUND CULTURE
MICRO NUMBER:: 203814
SPECIMEN QUALITY:: ADEQUATE

## 2018-08-08 ENCOUNTER — Telehealth (INDEPENDENT_AMBULATORY_CARE_PROVIDER_SITE_OTHER): Payer: Self-pay | Admitting: Physician Assistant

## 2018-08-08 NOTE — Telephone Encounter (Signed)
Spoke with nursing staff at [facility] and gave verbal orders to begin antibiotics, Doxycycline 100 mg BID x 14 days per [name]. Patient has follow up in office 08/11/2018.

## 2018-08-11 ENCOUNTER — Ambulatory Visit (INDEPENDENT_AMBULATORY_CARE_PROVIDER_SITE_OTHER): Payer: Medicaid Other | Admitting: Orthopedic Surgery

## 2018-08-12 ENCOUNTER — Telehealth (INDEPENDENT_AMBULATORY_CARE_PROVIDER_SITE_OTHER): Payer: Self-pay | Admitting: Orthopedic Surgery

## 2018-08-12 ENCOUNTER — Ambulatory Visit (INDEPENDENT_AMBULATORY_CARE_PROVIDER_SITE_OTHER): Payer: Medicaid Other | Admitting: Infectious Disease

## 2018-08-12 ENCOUNTER — Encounter: Payer: Self-pay | Admitting: Infectious Disease

## 2018-08-12 VITALS — BP 121/73 | HR 61

## 2018-08-12 DIAGNOSIS — B379 Candidiasis, unspecified: Secondary | ICD-10-CM

## 2018-08-12 DIAGNOSIS — Z89511 Acquired absence of right leg below knee: Secondary | ICD-10-CM

## 2018-08-12 DIAGNOSIS — T8740 Infection of amputation stump, unspecified extremity: Secondary | ICD-10-CM | POA: Insufficient documentation

## 2018-08-12 DIAGNOSIS — A491 Streptococcal infection, unspecified site: Secondary | ICD-10-CM

## 2018-08-12 DIAGNOSIS — A498 Other bacterial infections of unspecified site: Secondary | ICD-10-CM

## 2018-08-12 DIAGNOSIS — Z8614 Personal history of Methicillin resistant Staphylococcus aureus infection: Secondary | ICD-10-CM

## 2018-08-12 DIAGNOSIS — E1149 Type 2 diabetes mellitus with other diabetic neurological complication: Secondary | ICD-10-CM

## 2018-08-12 DIAGNOSIS — R7881 Bacteremia: Secondary | ICD-10-CM

## 2018-08-12 DIAGNOSIS — Z8739 Personal history of other diseases of the musculoskeletal system and connective tissue: Secondary | ICD-10-CM

## 2018-08-12 DIAGNOSIS — S98132A Complete traumatic amputation of one left lesser toe, initial encounter: Secondary | ICD-10-CM

## 2018-08-12 DIAGNOSIS — R239 Unspecified skin changes: Secondary | ICD-10-CM

## 2018-08-12 DIAGNOSIS — B9562 Methicillin resistant Staphylococcus aureus infection as the cause of diseases classified elsewhere: Secondary | ICD-10-CM

## 2018-08-12 DIAGNOSIS — R300 Dysuria: Secondary | ICD-10-CM

## 2018-08-12 DIAGNOSIS — T8789 Other complications of amputation stump: Secondary | ICD-10-CM

## 2018-08-12 DIAGNOSIS — L02612 Cutaneous abscess of left foot: Secondary | ICD-10-CM

## 2018-08-12 DIAGNOSIS — R6520 Severe sepsis without septic shock: Secondary | ICD-10-CM

## 2018-08-12 DIAGNOSIS — M0080 Arthritis due to other bacteria, unspecified joint: Secondary | ICD-10-CM

## 2018-08-12 DIAGNOSIS — A021 Salmonella sepsis: Secondary | ICD-10-CM

## 2018-08-12 DIAGNOSIS — M4645 Discitis, unspecified, thoracolumbar region: Secondary | ICD-10-CM

## 2018-08-12 DIAGNOSIS — M01X0 Direct infection of unspecified joint in infectious and parasitic diseases classified elsewhere: Secondary | ICD-10-CM

## 2018-08-12 DIAGNOSIS — N170 Acute kidney failure with tubular necrosis: Secondary | ICD-10-CM

## 2018-08-12 HISTORY — DX: Candidiasis, unspecified: B37.9

## 2018-08-12 HISTORY — DX: Infection of amputation stump, unspecified extremity: T87.40

## 2018-08-12 MED ORDER — CLOTRIMAZOLE 1 % EX CREA: 1.0000 "application " | TOPICAL_CREAM | Freq: Two times a day (BID) | CUTANEOUS | 0 refills | Status: DC

## 2018-08-12 NOTE — Telephone Encounter (Signed)
Please see below and advise the pt has a non healing right BKA and left second toe amp. SNF asking for wtb status.

## 2018-08-12 NOTE — Telephone Encounter (Signed)
Called and advised of message below

## 2018-08-12 NOTE — Telephone Encounter (Signed)
Gregory [name] with [facility] called needing clarification on weight bearing status. Harrell Gave said patient has been doing standing tranfers back and forth from his chair. The number to contact Harrell Gave is [phone]

## 2018-08-12 NOTE — Telephone Encounter (Signed)
NWB bilateral lower extremities

## 2018-08-12 NOTE — Progress Notes (Signed)
Subjective:   Chief complaint: New purulent drainage from his right below the knee amputation site  Patient ID: Gregory [name], male    DOB: Dec 30, 1960, 58 y.o.   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  At that time he also had dissemination of MRSA to his C-spine and required neurosurgery.  My intention back then was to have him on chronic doxycycline but he did not continue in care.  He ultimately had amputation of that toe.  He was then admitted this December with sepsis due to 2 osteomyelitis involving his second toe.  He is status post first ray amputation with Streptococcus canis being isolated on culture.  He did not want to go on IV antibiotics and we therefore treated him with Augmentin >  6 weeks of antimicrobial therapy.  Being followed closely by Dr. Sharol [name] as well as Gregory [name]   He continued to have progressive necrosis of the adjacent toe which Dr. Sharol [name] believes will auto amputate.  Since I last saw him he developed purulence from his amputation site and it appears that this was cultured and this is yielded a methicillin-resistant Staphylococcus aureus species.  Dr. Sharol [name] prescribe doxycycline with the patient had not yet begun this.  Dr. Sharol [name] also felt strongly the patient needed to have amputation of the BKA site as the bone was undoubtedly involved.  As he is not ready for that and also not ready for any amputations to his left foot although this does not really look encouraging to me.  Additional complaints of skin changes over his knee and also dysuria.  He was seen by urology and apparently had urine work-up but apparently urine cultures were negative.  I wondered if he might have balanitis but he denies having erythema on  the glans of his  penis.    Past Medical History:  Diagnosis Date  . Acquired absence of left great toe ([place])   . Acquired absence of right leg below knee ([place])   . Acute osteomyelitis of calcaneum, right ([place]) 10/02/2016  . Acute respiratory failure ([place])   . Anemia   . Basal cell carcinoma, eyelid   . Cancer ([name])   . CHF (congestive heart failure) ([facility])    chronic diastolic   . Chronic back pain   . Chronic kidney disease    stage 3   . Chronic multifocal osteomyelitis ([facility])    of ankle and foot   . Chronic pain syndrome   . COPD (chronic obstructive pulmonary disease) ([place])   . DDD (degenerative disc disease), lumbar   . Depression    major depressive disorder   . Diabetes mellitus without complication ([place])    type 2   . Diabetic ulcer of toe of left foot ([place]) 10/02/2016  . Difficulty in walking, not elsewhere classified   . 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 Foot drop, bilateral   . Foot drop, right 12/27/2015  . GERD (gastroesophageal reflux disease)   . Herpesviral vesicular dermatitis   . Hyperlipidemia   . Hyperlipidemia   . Hypertension   . Insomnia   . Malignant melanoma of other parts of face ([place])   . MRSA bacteremia   . Muscle weakness (generalized)   . Nasal congestion   .  Necrosis of toe ([place]) 07/08/2018  . Neuromuscular dysfunction of bladder   . Osteomyelitis ([place])   . Osteomyelitis ([place])   . Other idiopathic peripheral autonomic neuropathy   . Retention of urine, unspecified   . Squamous cell carcinoma of skin   . Unsteadiness on feet   . Urinary retention   . Urinary retention   . Urinary tract infection     Past Surgical History:  Procedure Laterality Date  . AMPUTATION Left 03/29/2017   Procedure: LEFT GREAT TOE AMPUTATION AT METATARSOPHALANGEAL JOINT;  Surgeon: Gregory Minion, MD;  Location: [place] [facility];  Service: Orthopedics;  Laterality: Left;  . AMPUTATION Right 03/29/2017   Procedure: RIGHT BELOW KNEE AMPUTATION;  Surgeon: Gregory Minion, MD;   Location: [facility];  Service: Orthopedics;  Laterality: Right;  . AMPUTATION Left 06/06/2018   Procedure: LEFT 2ND TOE AMPUTATION;  Surgeon: Gregory Minion, MD;  Location: [facility];  Service: Orthopedics;  Laterality: Left;  . ANTERIOR CERVICAL CORPECTOMY N/A 11/25/2015   Procedure: ANTERIOR CERVICAL FIVE CORPECTOMY Cervical four - six fusion;  Surgeon: Gregory Lose, MD;  Location: [facility];  Service: Neurosurgery;  Laterality: N/A;  ANTERIOR CERVICAL FIVE CORPECTOMY Cervical four - six fusion  . CYSTOSCOPY WITH RETROGRADE URETHROGRAM N/A 04/25/2018   Procedure: CYSTOSCOPY WITH RETROGRADE URETHROGRAM/ BALLOON DILATION;  Surgeon: Gregory Rhodes, MD;  Location: [facility];  Service: Urology;  Laterality: N/A;  . POSTERIOR CERVICAL FUSION/FORAMINOTOMY N/A 11/29/2015   Procedure: Cervical Three-Cervical Seven Posterior Cervical Laminectomy with Fusion;  Surgeon: Gregory Lose, MD;  Location: [facility];  Service: Neurosurgery;  Laterality: N/A;  Cervical Three-Cervical Seven Posterior Cervical Laminectomy with Fusion    Family History  Problem Relation Age of Onset  . Cancer Other   . Diabetes Other       Social History   Socioeconomic History  . Marital status: Divorced    Spouse name: Not on file  . Number of children: Not on file  . Years of education: Not on file  . Highest education level: Not on file  Occupational History  . Not on file  Social Needs  . Financial resource strain: Not on file  . Food insecurity:    Worry: Not on file    Inability: Not on file  . Transportation needs:    Medical: Not on file    Non-medical: Not on file  Tobacco Use  . Smoking status: Former Smoker    Packs/day: 3.00    Types: Cigarettes  . Smokeless tobacco: Never Used  Substance and Sexual Activity  . Alcohol use: No  . Drug use: No  . Sexual activity: Not on file  Lifestyle  . Physical activity:    Days per week: Not on file    Minutes per session: Not on file  . Stress: Not on file   Relationships  . Social connections:    Talks on phone: Not on file    Gets together: Not on file    Attends religious service: Not on file    Active member of club or organization: Not on file    Attends meetings of clubs or organizations: Not on file    Relationship status: Not on file  Other Topics Concern  . Not on file  Social History Narrative  . Not on file    No Active Allergies   Current Outpatient Medications:  .  acetaminophen (TYLENOL) 325 MG tablet, Take 650 mg by mouth every 4 (four) hours as needed for  moderate pain., Disp: , Rfl:  .  albuterol (PROVENTIL HFA;VENTOLIN HFA) 108 (90 Base) MCG/ACT inhaler, Inhale 2 puffs into the lungs every 6 (six) hours as needed for wheezing or shortness of breath., Disp: , Rfl:  .  amLODipine (NORVASC) 5 MG tablet, Take 1 tablet (5 mg total) by mouth daily., Disp: , Rfl:  .  amoxicillin-clavulanate (AUGMENTIN) 875-125 MG tablet, Take 1 tablet by mouth every 12 (twelve) hours., Disp: 60 tablet, Rfl: 2 .  antiseptic oral rinse (BIOTENE) LIQD, 15 mLs by Mouth Rinse route every 4 (four) hours as needed for dry mouth. , Disp: , Rfl:  .  atorvastatin (LIPITOR) 20 MG tablet, Take 20 mg by mouth at bedtime., Disp: , Rfl:  .  bisacodyl (DULCOLAX) 10 MG suppository, Place 1 suppository (10 mg total) rectally daily as needed for moderate constipation., Disp: 12 suppository, Rfl: 0 .  buPROPion (WELLBUTRIN XL) 300 MG 24 hr tablet, Take 300 mg by mouth at bedtime., Disp: , Rfl:  .  calcium carbonate (TUMS - DOSED IN MG ELEMENTAL CALCIUM) 500 MG chewable tablet, Chew 2 tablets by mouth every 8 (eight) hours as needed for indigestion or heartburn., Disp: , Rfl:  .  cetirizine (ZYRTEC) 10 MG tablet, Take 10 mg by mouth daily., Disp: , Rfl:  .  Cholecalciferol (VITAMIN D-3) 5000 units TABS, Take 5,000 Units by mouth daily., Disp: , Rfl:  .  diphenhydrAMINE (BENADRYL) 25 MG tablet, Take 1 tablet (25 mg total) by mouth every 6 (six) hours., Disp: 12  tablet, Rfl: 0 .  eszopiclone (LUNESTA) 2 MG TABS tablet, Take 2 mg by mouth at bedtime. Take immediately before bedtime, Disp: , Rfl:  .  finasteride (PROSCAR) 5 MG tablet, Take 5 mg by mouth daily., Disp: , Rfl:  .  furosemide (LASIX) 20 MG tablet, Take 20 mg by mouth daily. , Disp: , Rfl:  .  gemfibrozil (LOPID) 600 MG tablet, Take 600 mg by mouth every 12 (twelve) hours., Disp: , Rfl:  .  glipiZIDE (GLUCOTROL) 10 MG tablet, Take 10 mg by mouth daily before breakfast., Disp: , Rfl:  .  glucagon (GLUCAGON EMERGENCY) 1 MG injection, Inject 1 mg into the muscle once as needed (for CBG <70). , Disp: , Rfl:  .  guaiFENesin (MUCINEX) 600 MG 12 hr tablet, Take 600 mg by mouth every 12 (twelve) hours as needed for cough (congestion). , Disp: , Rfl:  .  insulin lispro (HUMALOG KWIKPEN) 100 UNIT/ML KwikPen, Inject 0-10 Units into the skin See admin instructions. Inject per sliding scale 3 times daily before meals; CBG <60 = notify MD, 0-150 = 0 units, 151-200 = 2 units, 201-250 = 4 units, 251-300 = 6 units, 301-350 = 8 units, 351-400 = 10 units, >400 = notify MD, Disp: , Rfl:  .  ipratropium-albuterol (DUONEB) 0.5-2.5 (3) MG/3ML SOLN, Take 3 mLs by nebulization every 6 (six) hours as needed (congestion/SOB)., Disp: , Rfl:  .  lidocaine (LIDODERM) 5 %, Place 1 patch onto the skin daily. Remove & Discard patch within 12 hours or as directed by MD, Disp: , Rfl:  .  magnesium oxide (MAG-OX) 400 MG tablet, Take 400 mg by mouth 2 (two) times daily., Disp: , Rfl:  .  metoprolol tartrate (LOPRESSOR) 25 MG tablet, Take 25 mg by mouth daily., Disp: , Rfl:  .  mometasone (NASONEX) 50 MCG/ACT nasal spray, Place 1 spray into the nose daily., Disp: , Rfl:  .  morphine (MS CONTIN) 15 MG 12 hr tablet,  Take 15 mg by mouth every 12 (twelve) hours., Disp: , Rfl:  .  omeprazole (PRILOSEC) 20 MG capsule, Take 40 mg by mouth daily. , Disp: , Rfl:  .  ondansetron (ZOFRAN) 4 MG tablet, Take 4 mg by mouth every 6 (six) hours as  needed for nausea or vomiting., Disp: , Rfl:  .  oxybutynin (DITROPAN XL) 15 MG 24 hr tablet, Take 15 mg by mouth at bedtime., Disp: , Rfl:  .  oxyCODONE (ROXICODONE) 15 MG immediate release tablet, Take 15 mg by mouth every 6 (six) hours as needed for pain., Disp: , Rfl:  .  PARoxetine (PAXIL) 20 MG tablet, Take 20 mg by mouth at bedtime., Disp: , Rfl:  .  polyethylene glycol (MIRALAX / GLYCOLAX) packet, Take 17 g by mouth daily as needed for mild constipation., Disp: 14 each, Rfl: 0 .  pregabalin (LYRICA) 150 MG capsule, Take 150 mg by mouth 2 (two) times daily., Disp: , Rfl:  .  senna-docusate (SENOKOT-S) 8.6-50 MG tablet, Take 2 tablets by mouth at bedtime., Disp: , Rfl:  .  tamsulosin (FLOMAX) 0.4 MG CAPS capsule, Take 0.8 mg by mouth every evening. , Disp: , Rfl:  .  tiZANidine (ZANAFLEX) 2 MG tablet, Take 2 mg by mouth every 8 (eight) hours as needed for muscle spasms., Disp: , Rfl:  .  vitamin B-12 (CYANOCOBALAMIN) 1000 MCG tablet, Take 1,000 mcg by mouth daily., Disp: , Rfl:    Review of Systems  Constitutional: Negative for activity change, appetite change, chills, diaphoresis, fatigue, fever and unexpected weight change.  HENT: Negative for congestion, rhinorrhea, sinus pressure, sneezing, sore throat and trouble swallowing.   Eyes: Negative for photophobia and visual disturbance.  Respiratory: Negative for cough, chest tightness, shortness of breath, wheezing and stridor.   Cardiovascular: Negative for chest pain, palpitations and leg swelling.  Gastrointestinal: Negative for abdominal distention, abdominal pain, anal bleeding, blood in stool, constipation, diarrhea, nausea and vomiting.  Genitourinary: Negative for difficulty urinating, dysuria, flank pain and hematuria.  Musculoskeletal: Positive for joint swelling and myalgias. Negative for arthralgias, back pain and gait problem.  Skin: Positive for color change, rash and wound. Negative for pallor.  Neurological: Negative for  dizziness, tremors, weakness and light-headedness.  Hematological: Negative for adenopathy. Does not bruise/bleed easily.  Psychiatric/Behavioral: Negative for agitation, behavioral problems, confusion, decreased concentration, dysphoric mood and sleep disturbance.       Objective:   Physical Exam Constitutional:      Appearance: He is well-developed.  HENT:     Head: Normocephalic and atraumatic.  Eyes:     Conjunctiva/sclera: Conjunctivae normal.  Neck:     Musculoskeletal: Normal range of motion and neck supple.  Cardiovascular:     Rate and Rhythm: Normal rate and regular rhythm.  Pulmonary:     Effort: Pulmonary effort is normal. No respiratory distress.     Breath sounds: No wheezing.  Abdominal:     General: There is no distension.     Palpations: Abdomen is soft.  Skin:    General: Skin is warm and dry.  Neurological:     Mental Status: He is alert and oriented to person, place, and time.  Psychiatric:        Mood and Affect: Mood normal.        Behavior: Behavior normal.        Thought Content: Thought content normal.        Judgment: Judgment normal.    Left lower extremity and Dynaflex bandage  see pictures below there is necrosis of the adjacent toe I can only get a glimpse of the wound bed though I did see picture yesterday that Gregory [name] took it orthopedics see picture from her yesterday below and then my pictures from today  She is in [place] [facility] July 07, 2018:     Pictures today from our clinic July 08, 2018:         Lower extremity 08/12/2018:    Right BKA site 08/12/2018:    Left knee with chronic skin changes consistent with possible fungal infection though he has other dermatological history.  Picture taken 08/12/2018:        Assessment & Plan:   Below the knee amputation site with purulent drainage and suspicion of underlying osteomyelitis: I have encouraged him to undergo amputation with Dr. Sharol [name] but he is against  this yet.  I have also encouraged him to start doxycycline along with continue his Augmentin if he can tolerate this combination.  If not we could switch him to maybe to clindamycin as his MRSA infection was susceptible to clindamycin  Osteomyelitis with sepsis due to Streptococcus Canis plus or minus anaerobes post first ray amputation now with necrosis of the adjacent digit  Due to worry that this area will not sufficiently healed though perhaps the one digit will auto amputate I have concerns about viability of this site  We will think he was going to need a more proximal amputation to control this as well  Interim continue Augmentin  History of MRSA bacteremia with discitis: He is I wanted him to be on chronic doxycycline I would like to pick this back up again now that he has also a site in his BKA that looks infected and from which MRSA has been isolated   Skin changes: I will prescribe him clotrimazole in case the area on his knee is due to a fungal infection.  I do not want to try to give him a nasal at present or Lamisil.  May be prudent to have him see a dermatologist but this is the least of his problems right now.    Dysuria: Wondered if he could have fungal balanitis this would be consistent with him having a negative urine culture.  Again could talk try topical clotrimazole I would not hazard a nasal since he is on a statin and I worry about the ability the nursing facility to coordinate discontinuing his statin while he is on azole therapy  I spent greater than 40 minutes with the patient including greater than 50% of time in face to face counsel of the patient and his serious deep infections that will likely require amputations but which she is trying to control with antibiotics, regarding his chronic skin problems and in coordination of his care.

## 2018-08-14 ENCOUNTER — Ambulatory Visit (INDEPENDENT_AMBULATORY_CARE_PROVIDER_SITE_OTHER): Payer: Medicaid Other | Admitting: Orthopedic Surgery

## 2018-08-14 ENCOUNTER — Telehealth (INDEPENDENT_AMBULATORY_CARE_PROVIDER_SITE_OTHER): Payer: Self-pay | Admitting: Orthopedic Surgery

## 2018-08-14 ENCOUNTER — Encounter (INDEPENDENT_AMBULATORY_CARE_PROVIDER_SITE_OTHER): Payer: Self-pay | Admitting: Orthopedic Surgery

## 2018-08-14 DIAGNOSIS — M86261 Subacute osteomyelitis, right tibia and fibula: Secondary | ICD-10-CM

## 2018-08-14 NOTE — Telephone Encounter (Signed)
Gerald Stabs, PT, from [facility] called wanting clarification on the weight bearing status of the patient.  Gerald Stabs' work [phone] or Cell [phone].  Thank you.

## 2018-08-14 NOTE — Telephone Encounter (Signed)
I called and lm on vm to advise Gerald Stabs with PT that the pt is still to remain NWTB on bilat lower extremities. To call with questions.

## 2018-08-15 ENCOUNTER — Telehealth (INDEPENDENT_AMBULATORY_CARE_PROVIDER_SITE_OTHER): Payer: Self-pay

## 2018-08-15 ENCOUNTER — Other Ambulatory Visit (INDEPENDENT_AMBULATORY_CARE_PROVIDER_SITE_OTHER): Payer: Self-pay

## 2018-08-15 DIAGNOSIS — M86261 Subacute osteomyelitis, right tibia and fibula: Secondary | ICD-10-CM

## 2018-08-15 NOTE — Telephone Encounter (Signed)
[facility] called stating that patient was scheduled for a MRI Tibia Fibula of right leg with contrast and they do not do MRI's with contrast there.  Please advise, Do you want a MRI with contrast or without contrast if not I will re-enter another MRI for you. Thank you.

## 2018-08-15 NOTE — Telephone Encounter (Signed)
Proceed with MRI without contrast

## 2018-08-15 NOTE — Telephone Encounter (Signed)
Thank you :)

## 2018-08-18 ENCOUNTER — Encounter (INDEPENDENT_AMBULATORY_CARE_PROVIDER_SITE_OTHER): Payer: Self-pay | Admitting: Orthopedic Surgery

## 2018-08-18 NOTE — Progress Notes (Signed)
Office Visit Note   Patient: Gregory [name]           Date of Birth: [birth year]           MRN: [id number] Visit Date: 08/14/2018              Requested by: [name], MD [place], [place] [address] PCP: [name], MD  Chief Complaint  Patient presents with  . Left Foot - Routine Post Op    06/06/18 left 1st and 2nd toe amp  . Right Leg - Follow-up      HPI: Patient is a 58 year old gentleman who is status post right transtibial amputation.  Patient complains of redness and ulceration over the residual limb.  Patient is also status post left foot first and second toe amputations.  Patient has been followed by infectious disease he is currently on Augmentin.  Patient states he has fungus on his leg and has been using a cream twice a day.  Assessment & Plan: Visit Diagnoses:  1. Subacute osteomyelitis of right tibia [facility])     Plan: Will request an MRI scan to further evaluate the distal tibia.  Anticipate that if this does show osteomyelitis would still recommend a revision of the transtibial amputation.  Follow-Up Instructions: Return in about 2 weeks (around 08/28/2018).   Ortho Exam  Patient is alert, oriented, no adenopathy, well-dressed, normal affect, normal respiratory effort. Examination patient has improved granulation tissue on the left foot.  He has a fungal rash around the left knee with venous insufficiency.  He has massive swelling and I recommended elevation and compression.  Examination the right transtibial amputation there is a area of 5 cm of cellulitis over the residual limb the ulcer probes to bone consistent with osteomyelitis.  Patient has severe protein caloric malnutrition and uncontrolled type 2 diabetes.  Imaging: No results found. No images are attached to the encounter.  Labs: Lab Results  Component Value Date   HGBA1C 8.0 (H) 06/04/2018   HGBA1C 7.9 (H) 04/25/2018   HGBA1C 7.2 (H) 03/25/2017   ESRSEDRATE 120 (H)  03/25/2017   ESRSEDRATE 84 (H) 10/02/2016   ESRSEDRATE 132 (H) 12/01/2015   CRP 4.9 (H) 03/25/2017   CRP 30.6 (H) 10/02/2016   CRP 13.7 (H) 11/11/2015   REPTSTATUS 06/11/2018 FINAL 06/06/2018   GRAMSTAIN  06/06/2018    RARE WBC PRESENT, PREDOMINANTLY PMN FEW GRAM POSITIVE COCCI    CULT  06/06/2018    MODERATE STREPTOCOCCUS CANIS NO ANAEROBES ISOLATED Performed at [facility], [place] [address] [place],  [address]    LABORGA PROVIDENCIA [name] (A) 01/12/2017     Lab Results  Component Value Date   ALBUMIN 2.2 (L) 06/04/2018   ALBUMIN 2.8 (L) 06/03/2018   ALBUMIN 3.2 (L) 03/28/2017   PREALBUMIN 23.3 03/25/2017    Body mass index is 34.45 kg/m.  Orders:  Orders Placed This Encounter  Procedures  . MR TIBIA FIBULA RIGHT W CONTRAST   No orders of the defined types were placed in this encounter.    Procedures: No procedures performed  Clinical Data: No additional findings.  ROS:  All other systems negative, except as noted in the HPI. Review of Systems  Objective: Vital Signs: Ht 6' (1.829 m)   Wt 254 lb (115.2 kg)   BMI 34.45 kg/m   Specialty Comments:  No specialty comments available.  PMFS History: Patient Active Problem List   Diagnosis Date Noted  . Amputation stump infection (  [place]) 08/12/2018  . Candidiasis 08/12/2018  . Necrosis of toe ([place]) 07/08/2018  . Amputated toe of left foot ([place]) 06/18/2018  . Streptococcal infection   . Pressure injury of skin 06/06/2018  . Cutaneous abscess of left foot   . Severe protein-calorie malnutrition ([place])   . Cellulitis of left leg   . Septic arthritis ([place]) 06/03/2018  . Idiopathic chronic venous hypertension of left lower extremity with ulcer and inflammation ([place]) 11/14/2017  . Great toe amputation status, left 11/14/2017  . Acquired absence of right leg below knee ([place]) 06/20/2017  . Osteomyelitis ([place]) 03/25/2017  . Skin cancer 03/25/2017  . Depression, recurrent ([place]) 11/05/2016  .  Diabetic ulcer of toe of left foot ([place]) 10/02/2016  . 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 Chronic pain 09/27/2016  . Dyslipidemia associated with type 2 diabetes mellitus ([place]) 06/06/2016  . GERD without esophagitis 05/11/2016  . Hypertensive heart disease with congestive heart failure ([place]) 12/15/2015  . Spinal stenosis in cervical region 12/15/2015  . Type II diabetes mellitus with neurological manifestations ([place]) 12/15/2015  . Chronic diastolic CHF (congestive heart failure) ([place])   . Pulmonary hypertension ([place])   . Urinary retention   . Diskitis   . Foot drop, bilateral   . Sepsis with acute renal failure without septic shock ([place])   . MRSA bacteremia 10/03/2015  . COPD (chronic obstructive pulmonary disease) ([place]) 10/03/2015  . Acute osteomyelitis of left foot ([place]) 10/03/2015   Past Medical History:  Diagnosis Date  . Acquired absence of left great toe ([place])   . Acquired absence of right leg below knee ([place])   . Acute osteomyelitis of calcaneum, right ([place]) 10/02/2016  . Acute respiratory failure ([place])   . Amputation stump infection ([place]) 08/12/2018  . Anemia   . Basal cell carcinoma, eyelid   . Cancer ([place])   . Candidiasis 08/12/2018  . CHF (congestive heart failure) (HCC)    chronic diastolic   . Chronic back pain   . Chronic kidney disease    stage 3   . Chronic multifocal osteomyelitis (HCC)    of ankle and foot   . Chronic pain syndrome   . COPD (chronic obstructive pulmonary disease) ([place])   . DDD (degenerative disc disease), lumbar   . Depression    major depressive disorder   . Diabetes mellitus without complication ([place])    type 2   . Diabetic ulcer of toe of left foot ([place]) 10/02/2016  . Difficulty in walking, not elsewhere classified   . 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 Foot drop, bilateral   . Foot drop, right 12/27/2015  . GERD (gastroesophageal reflux disease)   . Herpesviral vesicular dermatitis   . Hyperlipidemia   . Hyperlipidemia   . Hypertension   .  Insomnia   . Malignant melanoma of other parts of face ([place])   . MRSA bacteremia   . Muscle weakness (generalized)   . Nasal congestion   . Necrosis of toe ([place]) 07/08/2018  . Neuromuscular dysfunction of bladder   . Osteomyelitis ([place])   . Osteomyelitis ([place])   . Other idiopathic peripheral autonomic neuropathy   . Retention of urine, unspecified   . Squamous cell carcinoma of skin   . Unsteadiness on feet   . Urinary retention   . Urinary retention   . Urinary tract infection     Family History  Problem Relation Age of Onset  . Cancer Other   . Diabetes Other     Past Surgical History:  Procedure Laterality Date  . AMPUTATION Left 03/29/2017   Procedure: LEFT GREAT TOE AMPUTATION AT METATARSOPHALANGEAL JOINT;  Surgeon: [name], MD;  Location: [place];  Service: Orthopedics;  Laterality: Left;  . AMPUTATION Right 03/29/2017   Procedure: RIGHT BELOW KNEE AMPUTATION;  Surgeon: [name], MD;  Location: [place];  Service: Orthopedics;  Laterality: Right;  . AMPUTATION Left 06/06/2018   Procedure: LEFT 2ND TOE AMPUTATION;  Surgeon: [name], MD;  Location: [place];  Service: Orthopedics;  Laterality: Left;  . ANTERIOR CERVICAL CORPECTOMY N/A 11/25/2015   Procedure: ANTERIOR CERVICAL FIVE CORPECTOMY Cervical four - six fusion;  Surgeon: Consuella Lose, MD;  Location: [place] [facility];  Service: Neurosurgery;  Laterality: N/A;  ANTERIOR CERVICAL FIVE CORPECTOMY Cervical four - six fusion  . CYSTOSCOPY WITH RETROGRADE URETHROGRAM N/A 04/25/2018   Procedure: CYSTOSCOPY WITH RETROGRADE URETHROGRAM/ BALLOON DILATION;  Surgeon: Kathie Rhodes, MD;  [facility];  Service: Urology;  Laterality: N/A;  . POSTERIOR CERVICAL FUSION/FORAMINOTOMY N/A 11/29/2015   Procedure: Cervical Three-Cervical Seven Posterior Cervical Laminectomy with Fusion;  Surgeon: Consuella Lose, MD;  Location: [place] [facility];  Service: Neurosurgery;  Laterality: N/A;  Cervical Three-Cervical Seven Posterior  Cervical Laminectomy with Fusion   Social History   Occupational History  . Not on file  Tobacco Use  . Smoking status: Former Smoker    Packs/day: 3.00    Types: Cigarettes  . Smokeless tobacco: Never Used  Substance and Sexual Activity  . Alcohol use: No  . Drug use: No  . Sexual activity: Not on file

## 2018-08-19 ENCOUNTER — Telehealth (INDEPENDENT_AMBULATORY_CARE_PROVIDER_SITE_OTHER): Payer: Self-pay | Admitting: Orthopedic Surgery

## 2018-08-19 ENCOUNTER — Other Ambulatory Visit (INDEPENDENT_AMBULATORY_CARE_PROVIDER_SITE_OTHER): Payer: Self-pay

## 2018-08-19 DIAGNOSIS — M86261 Subacute osteomyelitis, right tibia and fibula: Secondary | ICD-10-CM

## 2018-08-19 NOTE — Telephone Encounter (Signed)
Orders were faxed.

## 2018-08-19 NOTE — Telephone Encounter (Signed)
Chris/[facility]/PT called needs orders faxed to [phone]  Making sure patient can weight bear on left foot for transfer.

## 2018-08-20 ENCOUNTER — Telehealth (INDEPENDENT_AMBULATORY_CARE_PROVIDER_SITE_OTHER): Payer: Self-pay | Admitting: *Deleted

## 2018-08-20 ENCOUNTER — Telehealth (INDEPENDENT_AMBULATORY_CARE_PROVIDER_SITE_OTHER): Payer: Self-pay | Admitting: Orthopedic Surgery

## 2018-08-20 ENCOUNTER — Telehealth (INDEPENDENT_AMBULATORY_CARE_PROVIDER_SITE_OTHER): Payer: Self-pay

## 2018-08-20 NOTE — Telephone Encounter (Signed)
I tried to call pt back again several times no answer will hold message and try tomorrow.

## 2018-08-20 NOTE — Telephone Encounter (Signed)
Patient called asked if he can get an MRI on both legs. The number to contact patient is [phone]

## 2018-08-20 NOTE — Telephone Encounter (Signed)
Please see message below and advise. He is being sch for an MRI of his BKA and wants an MRI of his left foot as well. Please advise.

## 2018-08-20 NOTE — Telephone Encounter (Signed)
Orders were printed and re-faxed again via [place].

## 2018-08-20 NOTE — Telephone Encounter (Signed)
I received vm from pt stating he has not heard anything back from imaging in reference to their last call about the order needed to be changed and that the imaging was suppose to contact us so we can fix the order, the order has been changed and I called and left vm back to pt advising of this and to contact imaging to scheudle appt

## 2018-08-20 NOTE — Telephone Encounter (Signed)
Need to treat BKA first, after this is stable then can get MRI of left foot

## 2018-08-20 NOTE — Telephone Encounter (Signed)
Spoke to patient and advised I would fax over the correct orders asap.

## 2018-08-20 NOTE — Telephone Encounter (Signed)
Gregory [name] needs some clarification on the wound care for his legs.  He states that at his last visit he was given socks.  Is he supposed to wear these instead of having his legs wrapped? He is at [place].  You can call him at his # [phone].

## 2018-08-20 NOTE — Telephone Encounter (Signed)
I spoke to patient and apologized for my Rx sent that it was an error on my behalf and I will resend another Rx with updated information pertaining to patient care to [facility].

## 2018-08-20 NOTE — Telephone Encounter (Signed)
Have tried to call pt multiple times. Will hold message and continue to try and reach.

## 2018-08-21 NOTE — Telephone Encounter (Signed)
I called and sw pt to advise of message below. He states that he will try tomorrow to make appt for MRI for sometime next week. Advised to call the office after this has been scheduled and make an appt with [name] to review the results. Voiced understanding and will call with any questions.

## 2018-09-10 ENCOUNTER — Inpatient Hospital Stay: Admission: RE | Admit: 2018-09-10 | Payer: Medicaid Other | Source: Ambulatory Visit

## 2018-09-11 ENCOUNTER — Other Ambulatory Visit: Payer: Medicaid Other

## 2018-09-12 ENCOUNTER — Ambulatory Visit: Payer: Medicaid Other | Admitting: Infectious Disease

## 2018-10-09 ENCOUNTER — Telehealth (INDEPENDENT_AMBULATORY_CARE_PROVIDER_SITE_OTHER): Payer: Self-pay | Admitting: Physician Assistant

## 2018-10-09 NOTE — Telephone Encounter (Signed)
Spoke with nursing at [facility] in [place] and the nursing staff reports the patient was observed sticking a fork into the right below the knee amputation site. The patient reports this is the only way he can get some additional pain medication. Nursing reports they feel the area looks infected, as does the patient's foot on the left.  The [facility] is currently on lock down status due to Covid 19 and the patient is unable to come to the office for a visit.  Nursing reports that the NP at the center can assess the patient tomorrow.  I instructed nursing to send the patient to the ER at [facility] if they have concerns for infection of the right transtibial amputation site and left foot and we can evaluate the patient if admitted.   [place]

## 2018-10-10 NOTE — Telephone Encounter (Signed)
This pt in not included in the study. His surgery was prior to our data collection start date.

## 2018-10-20 ENCOUNTER — Other Ambulatory Visit: Payer: Self-pay

## 2018-10-20 ENCOUNTER — Encounter: Payer: Self-pay | Admitting: Orthopedic Surgery

## 2018-10-20 ENCOUNTER — Ambulatory Visit (INDEPENDENT_AMBULATORY_CARE_PROVIDER_SITE_OTHER): Payer: Self-pay | Admitting: Physician Assistant

## 2018-10-20 ENCOUNTER — Ambulatory Visit (INDEPENDENT_AMBULATORY_CARE_PROVIDER_SITE_OTHER): Payer: Medicaid Other | Admitting: Orthopedic Surgery

## 2018-10-20 ENCOUNTER — Ambulatory Visit (INDEPENDENT_AMBULATORY_CARE_PROVIDER_SITE_OTHER): Payer: Medicaid Other

## 2018-10-20 VITALS — Ht 72.0 in | Wt 254.0 lb

## 2018-10-20 DIAGNOSIS — M86261 Subacute osteomyelitis, right tibia and fibula: Secondary | ICD-10-CM

## 2018-10-20 NOTE — Progress Notes (Signed)
Office Visit Note   Patient: Gregory [name]           Date of Birth: 1961-03-18           MRN: [id number] Visit Date: 10/20/2018              Requested by: [name], MD [place], [place] [address] PCP: [name], MD  Chief Complaint  Patient presents with  . Right Leg - Routine Post Op    Right leg BKA      HPI: Patient is a 58 year old gentleman who presents in follow-up for his right transtibial amputation.  By report from skilled nursing they state the patient has been sticking a fork in his wound of the transtibial amputation for pain medicine.  Patient has a ulcer with drainage.  Patient also complains of venous stasis swelling in the left leg despite elevation.  Patient is approximately a year and a half out from his transtibial amputation.  Assessment & Plan: Visit Diagnoses:  1. Subacute osteomyelitis of right tibia [facility])     Plan: Patient was given instructions to get a pair of medical compression stockings size extra-large for the left lower extremity.  With the osteomyelitis and ulceration with exposed tibia patient's only option is to proceed with a revision of the transtibial amputation.  This should help resolve his pain and anticipated hospitalization would be about 3 to 5 days prior to returning back to skilled nursing.  Follow-Up Instructions: Return in about 2 weeks (around 11/03/2018).   Ortho Exam  Patient is alert, oriented, no adenopathy, well-dressed, normal affect, normal respiratory effort. Examination patient has chronic venous stasis changes in the left lower extremity with massive edema and brawny skin changes.  The area of amputation of the great toe and second toe is healing well with 100% beefy granulation tissue after debridement of the fibrinous exudative tissue.  The ulcerative areas 10 x 20 mm and 5 mm deep there is no exposed bone or tendon no signs of infection.  There are no venous ulcers.  Examination the right  transtibial amputation patient has an area of cellulitis approximately 3 cm in diameter with an ulcer over the tip of the transtibial amputation.  This ulcer probes down to bone.  There is drainage from the ulcer.  Imaging: Xr Tibia/fibula Right  Result Date: 10/20/2018 2 view radiographs of the right tibia transtibial amputation shows destructive bony changes over the distal anterior cortex consistent with osteomyelitis.  No images are attached to the encounter.  Labs: Lab Results  Component Value Date   HGBA1C 8.0 (H) 06/04/2018   HGBA1C 7.9 (H) 04/25/2018   HGBA1C 7.2 (H) 03/25/2017   ESRSEDRATE 120 (H) 03/25/2017   ESRSEDRATE 84 (H) 10/02/2016   ESRSEDRATE 132 (H) 12/01/2015   CRP 4.9 (H) 03/25/2017   CRP 30.6 (H) 10/02/2016   CRP 13.7 (H) 11/11/2015   REPTSTATUS 06/11/2018 FINAL 06/06/2018   GRAMSTAIN  06/06/2018    RARE WBC PRESENT, PREDOMINANTLY PMN FEW GRAM POSITIVE COCCI    CULT  06/06/2018    MODERATE STREPTOCOCCUS CANIS NO ANAEROBES ISOLATED Performed at [facility], [place] [address], [place], [place] [address]    LABORGA PROVIDENCIA [name] (A) 01/12/2017     Lab Results  Component Value Date   ALBUMIN 2.2 (L) 06/04/2018   ALBUMIN 2.8 (L) 06/03/2018   ALBUMIN 3.2 (L) 03/28/2017   PREALBUMIN 23.3 03/25/2017    Body mass index is 34.45 kg/m.  Orders:  Orders Placed This Encounter  Procedures  . XR Tibia/Fibula Right   No orders of the defined types were placed in this encounter.    Procedures: No procedures performed  Clinical Data: No additional findings.  ROS:  All other systems negative, except as noted in the HPI. Review of Systems  Objective: Vital Signs: Ht 6' (1.829 m)   Wt 254 lb (115.2 kg)   BMI 34.45 kg/m   Specialty Comments:  No specialty comments available.  PMFS History: Patient Active Problem List   Diagnosis Date Noted  . Amputation stump infection ([place]) 08/12/2018  . Candidiasis 08/12/2018  . Necrosis of  toe ([place]) 07/08/2018  . Amputated toe of left foot ([place]) 06/18/2018  . Streptococcal infection   . Pressure injury of skin 06/06/2018  . Cutaneous abscess of left foot   . Severe protein-calorie malnutrition ([place])   . Cellulitis of left leg   . Septic arthritis ([place]) 06/03/2018  . Idiopathic chronic venous hypertension of left lower extremity with ulcer and inflammation ([place]) 11/14/2017  . Great toe amputation status, left 11/14/2017  . Acquired absence of right leg below knee ([place]) 06/20/2017  . Osteomyelitis ([place]) 03/25/2017  . Skin cancer 03/25/2017  . Depression, recurrent ([place]) 11/05/2016  . Diabetic ulcer of toe of left foot ([place]) 10/02/2016  . 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 Chronic pain 09/27/2016  . Dyslipidemia associated with type 2 diabetes mellitus ([place]) 06/06/2016  . GERD without esophagitis 05/11/2016  . Hypertensive heart disease with congestive heart failure ([place]) 12/15/2015  . Spinal stenosis in cervical region 12/15/2015  . Type II diabetes mellitus with neurological manifestations ([place]) 12/15/2015  . Chronic diastolic CHF (congestive heart failure) ([place])   . Pulmonary hypertension ([place])   . Urinary retention   . Diskitis   . Foot drop, bilateral   . Sepsis with acute renal failure without septic shock ([place])   . MRSA bacteremia 10/03/2015  . COPD (chronic obstructive pulmonary disease) ([place]) 10/03/2015  . Acute osteomyelitis of left foot ([place]) 10/03/2015   Past Medical History:  Diagnosis Date  . Acquired absence of left great toe ([place])   . Acquired absence of right leg below knee ([place])   . Acute osteomyelitis of calcaneum, right ([place]) 10/02/2016  . Acute respiratory failure ([place])   . Amputation stump infection ([place]) 08/12/2018  . Anemia   . Basal cell carcinoma, eyelid   . Cancer ([place])   . Candidiasis 08/12/2018  . CHF (congestive heart failure) (H[facility])    chronic diastolic   . Chronic back pain   . Chronic kidney disease    stage 3   . Chronic multifocal  osteomyelitis (HCC)    of ankle and foot   . Chronic pain syndrome   . COPD (chronic obstructive pulmonary disease) ([place])   . DDD (degenerative disc disease), lumbar   . Depression    major depressive disorder   . Diabetes mellitus without complication ([place])    type 2   . Diabetic ulcer of toe of left foot ([place]) 10/02/2016  . Difficulty in walking, not elsewhere classified   . 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 Foot drop, bilateral   . Foot drop, right 12/27/2015  . GERD (gastroesophageal reflux disease)   . Herpesviral vesicular dermatitis   . Hyperlipidemia   . Hyperlipidemia   . Hypertension   . Insomnia   . Malignant melanoma of other parts of face ([place])   . MRSA bacteremia   . Muscle weakness (generalized)   .  Nasal congestion   . Necrosis of toe ([place]) 07/08/2018  . Neuromuscular dysfunction of bladder   . Osteomyelitis ([place])   . Osteomyelitis ([place])   . Other idiopathic peripheral autonomic neuropathy   . Retention of urine, unspecified   . Squamous cell carcinoma of skin   . Unsteadiness on feet   . Urinary retention   . Urinary retention   . Urinary tract infection     Family History  Problem Relation Age of Onset  . Cancer Other   . Diabetes Other     Past Surgical History:  Procedure Laterality Date  . AMPUTATION Left 03/29/2017   Procedure: LEFT GREAT TOE AMPUTATION AT METATARSOPHALANGEAL JOINT;  Surgeon: [name], MD;  Location: [place];  Service: Orthopedics;  Laterality: Left;  . AMPUTATION Right 03/29/2017   Procedure: RIGHT BELOW KNEE AMPUTATION;  Surgeon: [name], MD;  Location: [place];  Service: Orthopedics;  Laterality: Right;  . AMPUTATION Left 06/06/2018   Procedure: LEFT 2ND TOE AMPUTATION;  Surgeon: [name], MD;  Location: [place];  Service: Orthopedics;  Laterality: Left;  . ANTERIOR CERVICAL CORPECTOMY N/A 11/25/2015   Procedure: ANTERIOR CERVICAL FIVE CORPECTOMY Cervical four - six fusion;  Surgeon: Consuella Lose, MD;  Location: [place]  [facility]UR[facility];  Service: Neurosurgery;  Laterality: N/A;  ANTERIOR CERVICAL FIVE CORPECTOMY Cervical four - six fusion  . CYSTOSCOPY WITH RETROGRADE URETHROGRAM N/A 04/25/2018   Procedure: CYSTOSCOPY WITH RETROGRADE URETHROGRAM/ BALLOON DILATION;  Surgeon: Kathie Rhodes, MD;  [facility];  Service: Urology;  Laterality: N/A;  . POSTERIOR CERVICAL FUSION/FORAMINOTOMY N/A 11/29/2015   Procedure: Cervical Three-Cervical Seven Posterior Cervical Laminectomy with Fusion;  Surgeon: Consuella Lose, MD;  Location: [place] [facility];  Service: Neurosurgery;  Laterality: N/A;  Cervical Three-Cervical Seven Posterior Cervical Laminectomy with Fusion   Social History   Occupational History  . Not on file  Tobacco Use  . Smoking status: Former Smoker    Packs/day: 3.00    Types: Cigarettes  . Smokeless tobacco: Never Used  Substance and Sexual Activity  . Alcohol use: No  . Drug use: No  . Sexual activity: Not on file

## 2018-10-21 ENCOUNTER — Other Ambulatory Visit: Payer: Self-pay

## 2018-10-21 ENCOUNTER — Encounter (HOSPITAL_COMMUNITY): Payer: Self-pay | Admitting: *Deleted

## 2018-10-21 NOTE — Progress Notes (Signed)
SDW-pre-op call completed by pt nurse, [place], LPN at [facility]. Nurse denies that pt C/O SOB na d chest pain. Nurse denies that pt is under the care of a cardiologist. Please complete anesthesia assessment and history of cardiac studies with pt on DOS.   Nurse denies that pt tested positive for COVID-19.  Coronavirus Screening  Nurse denies that pt experienced the following symptoms:  Cough yes/no: No Fever (>100.4F)  yes/no: No Runny nose yes/no: No Sore throat yes/no: No Difficulty breathing/shortness of breath  yes/no: No  Have you or a family member traveled in the last 14 days and where? yes/no: No  Pt nurse reminded that hospital visitation restrictions are in effect and the importance of the restrictions.  Nurse confirmed receipt of and verbalized understanding of all pre-op instructions. PA, Anesthesiology, asked to review pt history; see note

## 2018-10-21 NOTE — Pre-Procedure Instructions (Signed)
SONIA STICKELS  10/21/2018     No Pharmacies Listed   Your procedure is scheduled on Wednesday, Oct 22, 2018  Report to [facility] Admitting at 6:30 A.M.  Call this number if you have problems the morning of surgery:  [phone]   Remember:Brush your teeth the morning of surgery with your regular toothpaste.  Do not eat after midnight.  You may drink clear liquids until 4:30 A.M. .  Clear liquids allowed are:                    Water, Carbonated beverages, Clear Tea, Black Coffee only and Gatorade    Take these medicines the morning of surgery with A SIP OF WATER : cetirizine (ZYRTEC),  finasteride (PROSCAR),  metoprolol tartrate (LOPRESSOR),  morphine (MS CONTIN), omeprazole  (PRILOSEC), pregabalin (LYRICA)   If needed: acetaminophen (TYLENOL), albuterol (PROVENTIL HFA;VENTOLIN inhaler ( bring inhaler in with you )   Stop taking vitamins, fish oil and herbal medications. Do not take any NSAIDs ie: Ibuprofen, Advil, Naproxen (Aleve), Motrin, BC and Goody Powder; stop now.    How to Manage Your Diabetes Before and After Surgery  Why is it important to control my blood sugar before and after surgery?  Improving blood sugar levels before and after surgery helps healing and can limit problems.  A way of improving blood sugar control is eating a healthy diet by: o  Eating less sugar and carbohydrates o  Increasing activity/exercise o  Talking with your doctor about reaching your blood sugar goals  High blood sugars (greater than 180 mg/dL) can raise your risk of infections and slow your recovery, so you will need to focus on controlling your diabetes during the weeks before surgery.  Make sure that the doctor who takes care of your diabetes knows about your planned surgery including the date and location.  How do I manage my blood sugar before surgery?  Check your blood sugar at least 4 times a day, starting 2 days before surgery, to make sure that the level is not  too high or low. o Check your blood sugar the morning of your surgery when you wake up and every 2 hours until you get to the Short Stay unit.  If your blood sugar is less than 70 mg/dL, you will need to treat for low blood sugar: o Do not take insulin. o Treat a low blood sugar (less than 70 mg/dL) with  cup of clear juice (cranberry or apple), 4 glucose tablets, OR glucose gel. Recheck blood sugar in 15 minutes after treatment (to make sure it is greater than 70 mg/dL). If your blood sugar is not greater than 70 mg/dL on recheck, call [phone] o  for further instructions.  Report your blood sugar to the short stay nurse when you get to Short Stay.   If you are admitted to the hospital after surgery: o Your blood sugar will be checked by the staff and you will probably be given insulin after surgery (instead of oral diabetes medicines) to make sure you have good blood sugar levels. o The goal for blood sugar control after surgery is 80-180 mg/dL.  WHAT DO I DO ABOUT MY DIABETES MEDICATION?    Do not take oral diabetes medicines (pills) the morning of surgery such as glipiZIDE (GLUCOTROL)    THE MORNING OF SURGERY, take no insulin unless  your CBG is greater than 220 mg/dL, you may take  of your sliding scale (correction)  dose of insulin.  Reviewed and Endorsed by [facility] Patient Education Committee, August 2015   Do not wear jewelry, make-up or nail polish.  Do not wear lotions, powders, or perfumes, or deodorant.  Do not shave 48 hours prior to surgery.  Men may shave face and neck.  Do not bring valuables to the hospital.  [facility] is not responsible for any belongings or valuables.  Contacts, dentures or bridgework may not be worn into surgery.   Patients discharged the day of surgery will not be allowed to drive home  Please read over the following fact sheets that you were given.

## 2018-10-21 NOTE — Anesthesia Preprocedure Evaluation (Addendum)
Anesthesia Evaluation  Patient identified by MRN, date of birth, ID band Patient awake    Reviewed: Allergy & Precautions, H&P , NPO status , Patient's Chart, lab work & pertinent test results  Airway Mallampati: II   Neck ROM: full    Dental   Pulmonary COPD, Current Smoker,    breath sounds clear to auscultation       Cardiovascular hypertension, +CHF   Rhythm:regular Rate:Normal     Neuro/Psych PSYCHIATRIC DISORDERS Depression  Neuromuscular disease    GI/Hepatic GERD  ,  Endo/Other  diabetes, Type 2  Renal/GU Renal InsufficiencyRenal disease     Musculoskeletal  (+) Arthritis ,   Abdominal   Peds  Hematology   Anesthesia Other Findings   Reproductive/Obstetrics                            Anesthesia Physical Anesthesia Plan  ASA: IV  Anesthesia Plan: General   Post-op Pain Management:    Induction: Intravenous  PONV Risk Score and Plan: 1 and Ondansetron, Midazolam, Treatment may vary due to age or medical condition and Dexamethasone  Airway Management Planned: LMA  Additional Equipment:   Intra-op Plan:   Post-operative Plan:   Informed Consent: I have reviewed the patients History and Physical, chart, labs and discussed the procedure including the risks, benefits and alternatives for the proposed anesthesia with the patient or authorized representative who has indicated his/her understanding and acceptance.       Plan Discussed with: CRNA, Anesthesiologist and Surgeon  Anesthesia Plan Comments: (PAT note written 10/21/2018 by Myra Gianotti, [name]. )       Anesthesia Quick Evaluation

## 2018-10-21 NOTE — Progress Notes (Signed)
Anesthesia Chart Review: Gregory [name]   Case:  [id number] Date/Time:  10/22/18 0814   Procedure:  REVISION RIGHT BELOW KNEE AMPUTATION (Right )   Anesthesia type:  General   Pre-op diagnosis:  Osteomyelitis Right Below Knee Amptuation   Location:  [facility] OR ROOM 01 / [place] OR   Surgeon:  [name], MD      DISCUSSION: Patient is a 58 year old male scheduled for the above procedure. According to notes, patient with known new drainage from right BKA site since at least 07/2018. Also with drainage and swelling from left foot. Patient declined BKA revision. Management included local wound care and antibiotics per ID. Has also seen ID. Recently, [facility] staff report patient has been sticking a fork into his right transtibial amputation wound for pain medication. Imaging revealed osteomyelitis. Patient has agreed to proceed with right BKA revision.    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  - Admission 11/07/15-12/02/15 for MRSA sepsis, MRSA CAP, E. Coli UTI, osteomyelitis of the cervical spine and abscess in epidural space of lumbar spine. Treated with antibiotics. Intubated 11/07/15-11/15/15 (failed extubation 11/12/15). 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 He underwent corpectomy at C5 for decompression and arthrodesis C4-6 on 11/25/15 and s/p C3-7 laminectomy for decompression of  spinal cord and posterolateral arthrodesis C3-7 11/29/15. - Admission 03/25/12-04/17/12 for septic shock and pneumococcal pneumonia with acute respiratory and renal failure. Required short term CVVH and tracheostomy 04/03/12 (decannulated 04/10/12).   Patient is a same day work-up. Anesthesiologist to review labs and evaluate patient at that time to determine definitive anesthesia plan.    PROVIDERS: [name], MD is listed as PCP Tommy Medal, Roderic Scarce, MD is ID   LABS: He will need updated labs prior to surgery. Review of most recent labs in [facility] and [place] show Cr range of 1.46-3.30 since 2018, but most often seems to be ~ 1.8-2.0 range. Last Cr 1.46 on 06/09/18. H/H on 06/09/18 was low at 8.2/28.3. A1c 06/04/18 was 8.0.   IMAGES: CXR 06/03/18: IMPRESSION: Bronchitic markings without focal pneumonia.   EKG: 06/03/18: SR.   CV: Echo 11/20/15: Study Conclusions - Left ventricle: The cavity size was normal. There was mild   concentric hypertrophy. Systolic function was normal. The   estimated ejection fraction was in the range of 55% to 60%.   Doppler parameters are consistent with abnormal left ventricular   relaxation (grade 1 diastolic dysfunction). - Pericardium, extracardiac: A small pericardial effusion was   identified anterior to the heart. There was no evidence of   hemodynamic compromise.  Carotid US 04/11/12: Summary: - No significant extracranial carotid artery stenosis demonstrated. Right vertebral could not be visualized due to thickness of the neck and respiratory interference. Left vertebral artery flow is antegrade - Technically difficult due to thickness of the neck, high bifurcations, and respiratory interference.   Past Medical History:  Diagnosis Date  . Acquired absence of left great toe (  [place])   . Acquired absence of right leg below knee ([place])   . Acute osteomyelitis of calcaneum, right ([place]) 10/02/2016  . Acute  respiratory failure ([place])   . Amputation stump infection ([place]) 08/12/2018  . Anemia   . Basal cell carcinoma, eyelid   . Cancer ([place])   . Candidiasis 08/12/2018  . CHF (congestive heart failure) (HCC)    chronic diastolic   . Chronic back pain   . Chronic kidney disease    stage 3   . Chronic multifocal osteomyelitis (HCC)    of ankle and foot   . Chronic pain syndrome   . COPD (chronic obstructive pulmonary disease) ([place])   . DDD (degenerative disc disease), lumbar   . Depression    major depressive disorder   . Diabetes mellitus without complication ([place])    type 2   . Diabetic ulcer of toe of left foot ([place]) 10/02/2016  . Difficulty in walking, not elsewhere classified   . 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 Foot drop, bilateral   . Foot drop, right 12/27/2015  . GERD (gastroesophageal reflux disease)   . Herpesviral vesicular dermatitis   . Hyperlipidemia   . Hyperlipidemia   . Hypertension   . Insomnia   . Malignant melanoma of other parts of face ([place])   . MRSA bacteremia   . Muscle weakness (generalized)   . Nasal congestion   . Necrosis of toe ([place]) 07/08/2018  . Neuromuscular dysfunction of bladder   . Osteomyelitis ([place])   . Osteomyelitis ([place])   . Osteomyelitis ([place])    right BKA  . Other idiopathic peripheral autonomic neuropathy   . Retention of urine, unspecified   . Squamous cell carcinoma of skin   . Unsteadiness on feet   . Urinary retention   . Urinary retention   . Urinary tract infection     Past Surgical History:  Procedure Laterality Date  . AMPUTATION Left 03/29/2017   Procedure: LEFT GREAT TOE AMPUTATION AT METATARSOPHALANGEAL JOINT;  Surgeon: [name], MD;  Location: [place];  Service: Orthopedics;  Laterality: Left;  . AMPUTATION Right 03/29/2017   Procedure: RIGHT BELOW KNEE AMPUTATION;  Surgeon: [name], MD;  Location: [place];  Service: Orthopedics;  Laterality: Right;  . AMPUTATION Left 06/06/2018   Procedure: LEFT 2ND TOE  AMPUTATION;  Surgeon: [name], MD;  Location: [place];  Service: Orthopedics;  Laterality: Left;  . ANTERIOR CERVICAL CORPECTOMY N/A 11/25/2015   Procedure: ANTERIOR CERVICAL FIVE CORPECTOMY Cervical four - six fusion;  Surgeon: Consuella Lose, MD;  Location: [place] NEUR[facility] ORS;  Service: Neurosurgery;  Laterality: N/A;  ANTERIOR CERVICAL FIVE CORPECTOMY Cervical four - six fusion  . CYSTOSCOPY WITH RETROGRADE URETHROGRAM N/A 04/25/2018   Procedure: CYSTOSCOPY WITH RETROGRADE URETHROGRAM/ BALLOON DILATION;  Surgeon: Kathie Rhodes, MD;  [facility];  Service: Urology;  Laterality: N/A;  . POSTERIOR CERVICAL FUSION/FORAMINOTOMY N/A 11/29/2015   Procedure: Cervical Three-Cervical Seven Posterior Cervical Laminectomy with Fusion;  Surgeon: Consuella Lose, MD;  Location: [place] NEURO ORS;  Service: Neurosurgery;  Laterality: N/A;  Cervical Three-Cervical Seven Posterior Cervical Laminectomy with Fusion    MEDICATIONS: No current facility-administered medications for this encounter.    Marland Kitchen acetaminophen (TYLENOL) 325 MG tablet  . albuterol (PROVENTIL HFA;VENTOLIN HFA) 108 (90 Base) MCG/ACT inhaler  . atorvastatin (LIPITOR) 20 MG tablet  . bisacodyl (DULCOLAX) 10 MG suppository  . buPROPion (WELLBUTRIN XL) 300 MG 24 hr tablet  . calcium carbonate (TUMS - DOSED IN MG  ELEMENTAL CALCIUM) 500 MG chewable tablet  . cetirizine (ZYRTEC) 10 MG tablet  . CETYLPYRIDINIUM CHLORIDE MT  . Cholecalciferol (VITAMIN D-3) 5000 units TABS  . eszopiclone (LUNESTA) 1 MG TABS tablet  . finasteride (PROSCAR) 5 MG tablet  . furosemide (LASIX) 40 MG tablet  . glipiZIDE (GLUCOTROL) 10 MG tablet  . glucagon (GLUCAGON EMERGENCY) 1 MG injection  . guaiFENesin (MUCINEX) 600 MG 12 hr tablet  . insulin lispro (HUMALOG KWIKPEN) 100 UNIT/ML KwikPen  . lidocaine (LIDODERM) 5 %  . magnesium oxide (MAG-OX) 400 MG tablet  . metoprolol tartrate (LOPRESSOR) 25 MG tablet  . mirabegron ER (MYRBETRIQ) 50 MG TB24 tablet  . mometasone  (NASONEX) 50 MCG/ACT nasal spray  . morphine (MS CONTIN) 15 MG 12 hr tablet  . omeprazole (PRILOSEC) 40 MG capsule  . ondansetron (ZOFRAN) 4 MG tablet  . oxybutynin (DITROPAN XL) 15 MG 24 hr tablet  . Oxycodone HCl 20 MG TABS  . PARoxetine (PAXIL) 20 MG tablet  . polyethylene glycol (MIRALAX / GLYCOLAX) packet  . pregabalin (LYRICA) 150 MG capsule  . senna-docusate (SENOKOT-S) 8.6-50 MG tablet  . tamsulosin (FLOMAX) 0.4 MG CAPS capsule  . terbinafine (LAMISIL) 250 MG tablet  . Tiotropium Bromide Monohydrate (SPIRIVA RESPIMAT) 1.25 MCG/ACT AERS  . tiZANidine (ZANAFLEX) 4 MG tablet  . vitamin B-12 (CYANOCOBALAMIN) 1000 MCG tablet    Myra Gianotti, [name] Surgical Short Stay/Anesthesiology [facility] Phone [phone] [facility] Phone [phone] 10/21/2018 2:17 PM

## 2018-10-22 ENCOUNTER — Encounter (HOSPITAL_COMMUNITY): Payer: Self-pay

## 2018-10-22 ENCOUNTER — Other Ambulatory Visit: Payer: Self-pay

## 2018-10-22 ENCOUNTER — Encounter (HOSPITAL_COMMUNITY)
Admission: RE | Disposition: A | Discharge status: Skilled Nursing Facility | Payer: Medicaid Other | Source: Skilled Nursing Facility | Attending: Orthopedic Surgery

## 2018-10-22 ENCOUNTER — Inpatient Hospital Stay (HOSPITAL_COMMUNITY): Payer: Medicaid Other | Admitting: Anesthesiology

## 2018-10-22 ENCOUNTER — Inpatient Hospital Stay (HOSPITAL_COMMUNITY)
Admission: RE | Admit: 2018-10-22 | Discharge: 2018-10-24 | DRG: 493 | Disposition: A | Discharge status: Skilled Nursing Facility | Payer: Medicaid Other | Source: Skilled Nursing Facility | Attending: Orthopedic Surgery | Admitting: Orthopedic Surgery

## 2018-10-22 DIAGNOSIS — M7989 Other specified soft tissue disorders: Secondary | ICD-10-CM | POA: Diagnosis present

## 2018-10-22 DIAGNOSIS — G9009 Other idiopathic peripheral autonomic neuropathy: Secondary | ICD-10-CM | POA: Diagnosis present

## 2018-10-22 DIAGNOSIS — E1169 Type 2 diabetes mellitus with other specified complication: Secondary | ICD-10-CM | POA: Diagnosis present

## 2018-10-22 DIAGNOSIS — F17210 Nicotine dependence, cigarettes, uncomplicated: Secondary | ICD-10-CM | POA: Diagnosis present

## 2018-10-22 DIAGNOSIS — I878 Other specified disorders of veins: Secondary | ICD-10-CM | POA: Diagnosis present

## 2018-10-22 DIAGNOSIS — Z85820 Personal history of malignant melanoma of skin: Secondary | ICD-10-CM

## 2018-10-22 DIAGNOSIS — K219 Gastro-esophageal reflux disease without esophagitis: Secondary | ICD-10-CM | POA: Diagnosis present

## 2018-10-22 DIAGNOSIS — F329 Major depressive disorder, single episode, unspecified: Secondary | ICD-10-CM | POA: Diagnosis present

## 2018-10-22 DIAGNOSIS — E785 Hyperlipidemia, unspecified: Secondary | ICD-10-CM | POA: Diagnosis present

## 2018-10-22 DIAGNOSIS — G894 Chronic pain syndrome: Secondary | ICD-10-CM | POA: Diagnosis present

## 2018-10-22 DIAGNOSIS — S88111A Complete traumatic amputation at level between knee and ankle, right lower leg, initial encounter: Secondary | ICD-10-CM | POA: Diagnosis present

## 2018-10-22 DIAGNOSIS — D631 Anemia in chronic kidney disease: Secondary | ICD-10-CM | POA: Diagnosis present

## 2018-10-22 DIAGNOSIS — J449 Chronic obstructive pulmonary disease, unspecified: Secondary | ICD-10-CM | POA: Diagnosis present

## 2018-10-22 DIAGNOSIS — Z981 Arthrodesis status: Secondary | ICD-10-CM | POA: Diagnosis not present

## 2018-10-22 DIAGNOSIS — Z833 Family history of diabetes mellitus: Secondary | ICD-10-CM | POA: Diagnosis not present

## 2018-10-22 DIAGNOSIS — M86261 Subacute osteomyelitis, right tibia and fibula: Secondary | ICD-10-CM | POA: Diagnosis present

## 2018-10-22 DIAGNOSIS — Y835 Amputation of limb(s) as the cause of abnormal reaction of the patient, or of later complication, without mention of misadventure at the time of the procedure: Secondary | ICD-10-CM | POA: Diagnosis present

## 2018-10-22 DIAGNOSIS — I130 Hypertensive heart and chronic kidney disease with heart failure and stage 1 through stage 4 chronic kidney disease, or unspecified chronic kidney disease: Secondary | ICD-10-CM | POA: Diagnosis present

## 2018-10-22 DIAGNOSIS — E1122 Type 2 diabetes mellitus with diabetic chronic kidney disease: Secondary | ICD-10-CM | POA: Diagnosis present

## 2018-10-22 DIAGNOSIS — N183 Chronic kidney disease, stage 3 (moderate): Secondary | ICD-10-CM | POA: Diagnosis present

## 2018-10-22 DIAGNOSIS — M86461 Chronic osteomyelitis with draining sinus, right tibia and fibula: Secondary | ICD-10-CM | POA: Diagnosis not present

## 2018-10-22 DIAGNOSIS — T8781 Dehiscence of amputation stump: Secondary | ICD-10-CM | POA: Diagnosis present

## 2018-10-22 DIAGNOSIS — S78111A Complete traumatic amputation at level between right hip and knee, initial encounter: Secondary | ICD-10-CM | POA: Diagnosis present

## 2018-10-22 DIAGNOSIS — I5032 Chronic diastolic (congestive) heart failure: Secondary | ICD-10-CM | POA: Diagnosis present

## 2018-10-22 HISTORY — PX: APPLICATION OF WOUND VAC: SHX5189

## 2018-10-22 HISTORY — DX: Presence of dental prosthetic device (complete) (partial): Z97.2

## 2018-10-22 HISTORY — PX: STUMP REVISION: SHX6102

## 2018-10-22 HISTORY — DX: Presence of spectacles and contact lenses: Z97.3

## 2018-10-22 LAB — SURGICAL PCR SCREEN
MRSA, PCR: NEGATIVE
Staphylococcus aureus: POSITIVE — AB

## 2018-10-22 LAB — COMPREHENSIVE METABOLIC PANEL
ALT: 10 U/L (ref 0–44)
AST: 11 U/L — ABNORMAL LOW (ref 15–41)
Albumin: 3.3 g/dL — ABNORMAL LOW (ref 3.5–5.0)
Alkaline Phosphatase: 74 U/L (ref 38–126)
Anion gap: 11 (ref 5–15)
BUN: 20 mg/dL (ref 6–20)
CO2: 25 mmol/L (ref 22–32)
Calcium: 8.8 mg/dL — ABNORMAL LOW (ref 8.9–10.3)
Chloride: 102 mmol/L (ref 98–111)
Creatinine, Ser: 1.51 mg/dL — ABNORMAL HIGH (ref 0.61–1.24)
GFR calc Af Amer: 58 mL/min — ABNORMAL LOW (ref >60)
GFR calc non Af Amer: 50 mL/min — ABNORMAL LOW (ref >60)
Glucose, Bld: 124 mg/dL — ABNORMAL HIGH (ref 70–99)
Potassium: 4 mmol/L (ref 3.5–5.1)
Sodium: 138 mmol/L (ref 135–145)
Total Bilirubin: 0.5 mg/dL (ref 0.3–1.2)
Total Protein: 7 g/dL (ref 6.5–8.1)

## 2018-10-22 LAB — CBC
HCT: 34.4 % — ABNORMAL LOW (ref 39.0–52.0)
Hemoglobin: 10.3 g/dL — ABNORMAL LOW (ref 13.0–17.0)
MCH: 24.8 pg — ABNORMAL LOW (ref 26.0–34.0)
MCHC: 29.9 g/dL — ABNORMAL LOW (ref 30.0–36.0)
MCV: 82.7 fL (ref 80.0–100.0)
Platelets: 228 10*3/uL (ref 150–400)
RBC: 4.16 MIL/uL — ABNORMAL LOW (ref 4.22–5.81)
RDW: 16.4 % — ABNORMAL HIGH (ref 11.5–15.5)
WBC: 9.1 10*3/uL (ref 4.0–10.5)
nRBC: 0 % (ref 0.0–0.2)

## 2018-10-22 LAB — GLUCOSE, CAPILLARY
Glucose-Capillary: 122 mg/dL — ABNORMAL HIGH (ref 70–99)
Glucose-Capillary: 113 mg/dL — ABNORMAL HIGH (ref 70–99)
Glucose-Capillary: 104 mg/dL — ABNORMAL HIGH (ref 70–99)
Glucose-Capillary: 97 mg/dL (ref 70–99)
Glucose-Capillary: 198 mg/dL — ABNORMAL HIGH (ref 70–99)

## 2018-10-22 SURGERY — REVISION, AMPUTATION SITE
Anesthesia: General | Site: Leg Lower | Laterality: Right

## 2018-10-22 MED ORDER — ONDANSETRON HCL 4 MG/2ML IJ SOLN: 4.0000 mg | Freq: Four times a day (QID) | INTRAMUSCULAR | Status: DC | PRN

## 2018-10-22 MED ORDER — UMECLIDINIUM BROMIDE 62.5 MCG/INH IN AEPB
1.0000 | INHALATION_SPRAY | Freq: Every day | RESPIRATORY_TRACT | Status: DC
  Administered 2018-10-23 – 2018-10-24 (×2): 1 via RESPIRATORY_TRACT
  Filled 2018-10-22: qty 7

## 2018-10-22 MED ORDER — BISACODYL 10 MG RE SUPP: 10.0000 mg | Freq: Every day | RECTAL | Status: DC | PRN

## 2018-10-22 MED ORDER — CEFAZOLIN SODIUM-DEXTROSE 2-3 GM-%(50ML) IV SOLR
INTRAVENOUS | Status: DC | PRN
  Administered 2018-10-22: 2 g via INTRAVENOUS

## 2018-10-22 MED ORDER — GLIPIZIDE 5 MG PO TABS
10.0000 mg | ORAL_TABLET | Freq: Every day | ORAL | Status: DC
  Administered 2018-10-23 – 2018-10-24 (×2): 10 mg via ORAL
  Filled 2018-10-22 (×2): qty 2

## 2018-10-22 MED ORDER — TAMSULOSIN HCL 0.4 MG PO CAPS
0.8000 mg | ORAL_CAPSULE | Freq: Every evening | ORAL | Status: DC
  Administered 2018-10-22 – 2018-10-23 (×2): 0.8 mg via ORAL
  Filled 2018-10-22 (×2): qty 2

## 2018-10-22 MED ORDER — METOCLOPRAMIDE HCL 5 MG/ML IJ SOLN: 5.0000 mg | Freq: Three times a day (TID) | INTRAMUSCULAR | Status: DC | PRN

## 2018-10-22 MED ORDER — BUPROPION HCL ER (XL) 150 MG PO TB24
300.0000 mg | ORAL_TABLET | Freq: Every day | ORAL | Status: DC
  Administered 2018-10-22 – 2018-10-23 (×2): 300 mg via ORAL
  Filled 2018-10-22 (×2): qty 2

## 2018-10-22 MED ORDER — TERBINAFINE HCL 250 MG PO TABS
250.0000 mg | ORAL_TABLET | Freq: Every day | ORAL | Status: DC
  Administered 2018-10-23 – 2018-10-24 (×2): 250 mg via ORAL
  Filled 2018-10-22 (×2): qty 1

## 2018-10-22 MED ORDER — FENTANYL CITRATE (PF) 100 MCG/2ML IJ SOLN
INTRAMUSCULAR | Status: DC | PRN
  Administered 2018-10-22: 50 ug via INTRAVENOUS

## 2018-10-22 MED ORDER — 0.9 % SODIUM CHLORIDE (POUR BTL) OPTIME
TOPICAL | Status: DC | PRN
  Administered 2018-10-22: 12:00:00 1000 mL

## 2018-10-22 MED ORDER — PHENYLEPHRINE 40 MCG/ML (10ML) SYRINGE FOR IV PUSH (FOR BLOOD PRESSURE SUPPORT)
PREFILLED_SYRINGE | INTRAVENOUS | Status: AC
  Filled 2018-10-22: qty 10

## 2018-10-22 MED ORDER — ALBUTEROL SULFATE (2.5 MG/3ML) 0.083% IN NEBU: 3.0000 mL | INHALATION_SOLUTION | Freq: Four times a day (QID) | RESPIRATORY_TRACT | Status: DC | PRN

## 2018-10-22 MED ORDER — ATORVASTATIN CALCIUM 10 MG PO TABS
20.0000 mg | ORAL_TABLET | Freq: Every day | ORAL | Status: DC
  Administered 2018-10-22 – 2018-10-23 (×2): 20 mg via ORAL
  Filled 2018-10-22 (×2): qty 2

## 2018-10-22 MED ORDER — CEFAZOLIN SODIUM-DEXTROSE 2-4 GM/100ML-% IV SOLN
2.0000 g | Freq: Four times a day (QID) | INTRAVENOUS | Status: AC
  Administered 2018-10-22 – 2018-10-23 (×3): 2 g via INTRAVENOUS
  Filled 2018-10-22 (×3): qty 100

## 2018-10-22 MED ORDER — PREGABALIN 75 MG PO CAPS
150.0000 mg | ORAL_CAPSULE | Freq: Two times a day (BID) | ORAL | Status: DC
  Administered 2018-10-22 – 2018-10-24 (×4): 150 mg via ORAL
  Filled 2018-10-22 (×4): qty 2

## 2018-10-22 MED ORDER — LIDOCAINE HCL (CARDIAC) PF 100 MG/5ML IV SOSY
PREFILLED_SYRINGE | INTRAVENOUS | Status: DC | PRN
  Administered 2018-10-22: 60 mg via INTRAVENOUS

## 2018-10-22 MED ORDER — FENTANYL CITRATE (PF) 100 MCG/2ML IJ SOLN
25.0000 ug | INTRAMUSCULAR | Status: DC | PRN
  Administered 2018-10-22 (×2): 50 ug via INTRAVENOUS

## 2018-10-22 MED ORDER — AMLODIPINE BESYLATE 10 MG PO TABS
10.0000 mg | ORAL_TABLET | Freq: Every day | ORAL | Status: DC
  Administered 2018-10-22 – 2018-10-24 (×3): 10 mg via ORAL
  Filled 2018-10-22 (×3): qty 1

## 2018-10-22 MED ORDER — FUROSEMIDE 40 MG PO TABS
40.0000 mg | ORAL_TABLET | Freq: Every day | ORAL | Status: DC
  Administered 2018-10-22 – 2018-10-24 (×3): 40 mg via ORAL
  Filled 2018-10-22 (×3): qty 1

## 2018-10-22 MED ORDER — MIRABEGRON ER 25 MG PO TB24
50.0000 mg | ORAL_TABLET | Freq: Every day | ORAL | Status: DC
  Administered 2018-10-22 – 2018-10-24 (×3): 50 mg via ORAL
  Filled 2018-10-22 (×3): qty 2

## 2018-10-22 MED ORDER — INSULIN ASPART 100 UNIT/ML ~~LOC~~ SOLN
4.0000 [IU] | Freq: Three times a day (TID) | SUBCUTANEOUS | Status: DC
  Administered 2018-10-24 (×2): 4 [IU] via SUBCUTANEOUS

## 2018-10-22 MED ORDER — OXYCODONE HCL 5 MG PO TABS: 5.0000 mg | ORAL_TABLET | Freq: Once | ORAL | Status: DC | PRN

## 2018-10-22 MED ORDER — ONDANSETRON HCL 4 MG/2ML IJ SOLN
INTRAMUSCULAR | Status: AC
  Filled 2018-10-22: qty 2

## 2018-10-22 MED ORDER — MAGNESIUM CITRATE PO SOLN: 1.0000 | Freq: Once | ORAL | Status: DC | PRN

## 2018-10-22 MED ORDER — DOCUSATE SODIUM 100 MG PO CAPS
100.0000 mg | ORAL_CAPSULE | Freq: Two times a day (BID) | ORAL | Status: DC
  Administered 2018-10-22 – 2018-10-24 (×4): 100 mg via ORAL
  Filled 2018-10-22 (×4): qty 1

## 2018-10-22 MED ORDER — FINASTERIDE 5 MG PO TABS
5.0000 mg | ORAL_TABLET | Freq: Every day | ORAL | Status: DC
  Administered 2018-10-23 – 2018-10-24 (×2): 5 mg via ORAL
  Filled 2018-10-22 (×3): qty 1

## 2018-10-22 MED ORDER — OXYCODONE HCL 5 MG PO TABS
ORAL_TABLET | ORAL | Status: AC
  Filled 2018-10-22: qty 4

## 2018-10-22 MED ORDER — METHOCARBAMOL 500 MG PO TABS
500.0000 mg | ORAL_TABLET | Freq: Four times a day (QID) | ORAL | Status: DC | PRN
  Administered 2018-10-22 – 2018-10-24 (×5): 500 mg via ORAL
  Filled 2018-10-22 (×4): qty 1

## 2018-10-22 MED ORDER — LACTATED RINGERS IV SOLN
INTRAVENOUS | Status: DC
  Administered 2018-10-22: 10:00:00 via INTRAVENOUS

## 2018-10-22 MED ORDER — METHOCARBAMOL 500 MG PO TABS
ORAL_TABLET | ORAL | Status: AC
  Filled 2018-10-22: qty 1

## 2018-10-22 MED ORDER — INSULIN ASPART 100 UNIT/ML ~~LOC~~ SOLN
0.0000 [IU] | Freq: Three times a day (TID) | SUBCUTANEOUS | Status: DC
  Administered 2018-10-23: 18:00:00 3 [IU] via SUBCUTANEOUS

## 2018-10-22 MED ORDER — LIDOCAINE 2% (20 MG/ML) 5 ML SYRINGE
INTRAMUSCULAR | Status: AC
  Filled 2018-10-22: qty 5

## 2018-10-22 MED ORDER — POLYETHYLENE GLYCOL 3350 17 G PO PACK: 17.0000 g | PACK | Freq: Every day | ORAL | Status: DC | PRN

## 2018-10-22 MED ORDER — OXYBUTYNIN CHLORIDE ER 15 MG PO TB24
15.0000 mg | ORAL_TABLET | Freq: Every day | ORAL | Status: DC
  Administered 2018-10-22 – 2018-10-23 (×2): 15 mg via ORAL
  Filled 2018-10-22 (×3): qty 1

## 2018-10-22 MED ORDER — METOCLOPRAMIDE HCL 5 MG PO TABS: 5.0000 mg | ORAL_TABLET | Freq: Three times a day (TID) | ORAL | Status: DC | PRN

## 2018-10-22 MED ORDER — CEFAZOLIN SODIUM-DEXTROSE 2-4 GM/100ML-% IV SOLN
INTRAVENOUS | Status: AC
  Filled 2018-10-22: qty 100

## 2018-10-22 MED ORDER — ONDANSETRON HCL 4 MG PO TABS: 4.0000 mg | ORAL_TABLET | Freq: Four times a day (QID) | ORAL | Status: DC | PRN

## 2018-10-22 MED ORDER — PROPOFOL 10 MG/ML IV BOLUS
INTRAVENOUS | Status: DC | PRN
  Administered 2018-10-22: 180 mg via INTRAVENOUS

## 2018-10-22 MED ORDER — MIDAZOLAM HCL 2 MG/2ML IJ SOLN
INTRAMUSCULAR | Status: AC
  Filled 2018-10-22: qty 2

## 2018-10-22 MED ORDER — DIPHENHYDRAMINE HCL 12.5 MG/5ML PO ELIX: 12.5000 mg | ORAL_SOLUTION | ORAL | Status: DC | PRN

## 2018-10-22 MED ORDER — TIOTROPIUM BROMIDE MONOHYDRATE 1.25 MCG/ACT IN AERS: 2.0000 | INHALATION_SPRAY | Freq: Every day | RESPIRATORY_TRACT | Status: DC

## 2018-10-22 MED ORDER — ONDANSETRON HCL 4 MG/2ML IJ SOLN
INTRAMUSCULAR | Status: DC | PRN
  Administered 2018-10-22: 4 mg via INTRAVENOUS

## 2018-10-22 MED ORDER — OXYCODONE HCL 5 MG PO TABS
20.0000 mg | ORAL_TABLET | Freq: Four times a day (QID) | ORAL | Status: DC | PRN
  Administered 2018-10-22 – 2018-10-24 (×6): 20 mg via ORAL
  Filled 2018-10-22 (×5): qty 4

## 2018-10-22 MED ORDER — OXYCODONE HCL 5 MG/5ML PO SOLN: 5.0000 mg | Freq: Once | ORAL | Status: DC | PRN

## 2018-10-22 MED ORDER — ACETAMINOPHEN 325 MG PO TABS
325.0000 mg | ORAL_TABLET | Freq: Four times a day (QID) | ORAL | Status: DC | PRN
  Filled 2018-10-22: qty 2

## 2018-10-22 MED ORDER — CHLORHEXIDINE GLUCONATE 4 % EX LIQD: 60.0000 mL | Freq: Once | CUTANEOUS | Status: DC

## 2018-10-22 MED ORDER — ZOLPIDEM TARTRATE 5 MG PO TABS
5.0000 mg | ORAL_TABLET | Freq: Every day | ORAL | Status: DC
  Administered 2018-10-22 – 2018-10-23 (×2): 5 mg via ORAL
  Filled 2018-10-22 (×2): qty 1

## 2018-10-22 MED ORDER — HYDROMORPHONE HCL 1 MG/ML IJ SOLN
0.5000 mg | INTRAMUSCULAR | Status: DC | PRN
  Administered 2018-10-22 – 2018-10-23 (×4): 1 mg via INTRAVENOUS
  Filled 2018-10-22 (×4): qty 1

## 2018-10-22 MED ORDER — FENTANYL CITRATE (PF) 250 MCG/5ML IJ SOLN
INTRAMUSCULAR | Status: AC
  Filled 2018-10-22: qty 5

## 2018-10-22 MED ORDER — METHOCARBAMOL 1000 MG/10ML IJ SOLN
500.0000 mg | Freq: Four times a day (QID) | INTRAVENOUS | Status: DC | PRN
  Filled 2018-10-22: qty 5

## 2018-10-22 MED ORDER — METOPROLOL TARTRATE 25 MG PO TABS
25.0000 mg | ORAL_TABLET | Freq: Every day | ORAL | Status: DC
  Administered 2018-10-23 – 2018-10-24 (×2): 25 mg via ORAL
  Filled 2018-10-22 (×3): qty 1

## 2018-10-22 MED ORDER — FENTANYL CITRATE (PF) 100 MCG/2ML IJ SOLN
INTRAMUSCULAR | Status: AC
  Filled 2018-10-22: qty 2

## 2018-10-22 MED ORDER — SODIUM CHLORIDE 0.9 % IV SOLN
INTRAVENOUS | Status: DC
  Administered 2018-10-22: 17:00:00 via INTRAVENOUS

## 2018-10-22 MED ORDER — MIDAZOLAM HCL 5 MG/5ML IJ SOLN
INTRAMUSCULAR | Status: DC | PRN
  Administered 2018-10-22 (×2): 1 mg via INTRAVENOUS

## 2018-10-22 MED ORDER — PAROXETINE HCL 20 MG PO TABS
20.0000 mg | ORAL_TABLET | Freq: Every day | ORAL | Status: DC
  Administered 2018-10-22 – 2018-10-23 (×2): 20 mg via ORAL
  Filled 2018-10-22 (×2): qty 1

## 2018-10-22 MED ORDER — MORPHINE SULFATE ER 15 MG PO TBCR
15.0000 mg | EXTENDED_RELEASE_TABLET | Freq: Two times a day (BID) | ORAL | Status: DC
  Administered 2018-10-22 – 2018-10-24 (×4): 15 mg via ORAL
  Filled 2018-10-22 (×4): qty 1

## 2018-10-22 SURGICAL SUPPLY — 32 items
BLADE SAW RECIP 87.9 MT (BLADE) IMPLANT
BLADE SURG 21 STRL SS (BLADE) ×3 IMPLANT
CANISTER WOUND CARE 500ML ATS (WOUND CARE) IMPLANT
CANISTER WOUNDNEG PRESSURE 500 (CANNISTER) ×3 IMPLANT
COVER SURGICAL LIGHT HANDLE (MISCELLANEOUS) ×3 IMPLANT
COVER WAND RF STERILE (DRAPES) ×3 IMPLANT
DRAPE EXTREMITY T 121X128X90 (DISPOSABLE) ×3 IMPLANT
DRAPE HALF SHEET 40X57 (DRAPES) ×3 IMPLANT
DRAPE INCISE IOBAN 66X45 STRL (DRAPES) ×3 IMPLANT
DRAPE U-SHAPE 47X51 STRL (DRAPES) ×6 IMPLANT
DRESSING PREVENA PLUS CUSTOM (GAUZE/BANDAGES/DRESSINGS) ×1 IMPLANT
DRSG PREVENA PLUS CUSTOM (GAUZE/BANDAGES/DRESSINGS) ×3
DURAPREP 26ML APPLICATOR (WOUND CARE) ×3 IMPLANT
ELECT REM PT RETURN 9FT ADLT (ELECTROSURGICAL) ×3
ELECTRODE REM PT RTRN 9FT ADLT (ELECTROSURGICAL) ×1 IMPLANT
GLOVE BIOGEL PI IND STRL 9 (GLOVE) ×1 IMPLANT
GLOVE BIOGEL PI INDICATOR 9 (GLOVE) ×2
GLOVE SURG ORTHO 9.0 STRL STRW (GLOVE) ×3 IMPLANT
GOWN STRL REUS W/ TWL XL LVL3 (GOWN DISPOSABLE) ×2 IMPLANT
GOWN STRL REUS W/TWL XL LVL3 (GOWN DISPOSABLE) ×6
KIT BASIN OR (CUSTOM PROCEDURE TRAY) ×3 IMPLANT
KIT TURNOVER KIT B (KITS) ×3 IMPLANT
MANIFOLD NEPTUNE II (INSTRUMENTS) ×3 IMPLANT
NS IRRIG 1000ML POUR BTL (IV SOLUTION) ×3 IMPLANT
PACK GENERAL/GYN (CUSTOM PROCEDURE TRAY) ×3 IMPLANT
PAD ARMBOARD 7.5X6 YLW CONV (MISCELLANEOUS) ×3 IMPLANT
PREVENA RESTOR ARTHOFORM 46X30 (CANNISTER) ×3 IMPLANT
STAPLER VISISTAT 35W (STAPLE) IMPLANT
SUT ETHILON 2 0 PSLX (SUTURE) ×12 IMPLANT
SUT SILK 2 0 (SUTURE)
SUT SILK 2-0 18XBRD TIE 12 (SUTURE) IMPLANT
TOWEL OR 17X26 10 PK STRL BLUE (TOWEL DISPOSABLE) ×3 IMPLANT

## 2018-10-22 NOTE — Op Note (Signed)
10/22/2018  12:51 PM  PATIENT:  Gregory [name]    PRE-OPERATIVE DIAGNOSIS:  Osteomyelitis Right Below Knee Amptuation  POST-OPERATIVE DIAGNOSIS:  Same  PROCEDURE:  REVISION RIGHT BELOW KNEE AMPUTATION, Application Of Wound Vac  SURGEON:  [name], MD  PHYSICIAN ASSISTANT:None ANESTHESIA:   General  PREOPERATIVE INDICATIONS:  [name] is a  58 y.o. male with a diagnosis of Osteomyelitis Right Below Knee Amptuation who failed conservative measures and elected for surgical management.    The risks benefits and alternatives were discussed with the patient preoperatively including but not limited to the risks of infection, bleeding, nerve injury, cardiopulmonary complications, the need for revision surgery, among others, and the patient was willing to proceed.  OPERATIVE IMPLANTS: [name] customizable and restore wound VAC  @ENCIMAGES @  OPERATIVE FINDINGS: Abscess extended down to bone.  Bone and soft tissue sent for cultures.  OPERATIVE PROCEDURE: Patient brought the operating room and underwent a general anesthetic.  After adequate levels anesthesia were obtained patient's right lower extremity was prepped using DuraPrep draped into a sterile field a timeout was called.  A fishmouth incision was made around the ulcerative cellulitic tissue.  This was carried sharply down to bone.  The ulcer extended down to bone.  The distal 2 cm of bone was resected from the tibia and fibula the tibia was beveled anteriorly.  The bone and abscess soft tissue were sent for cultures.  Electrocautery was used for hemostasis the wound was irrigated with normal saline.  2-0 nylon was used for closure of the incision.  A [name] customizable and restore dressing was applied this had a good suction fit patient was extubated taken the PACU in stable condition.   DISCHARGE PLANNING:  Antibiotic duration: Antibiotics continue until cultures are finalized  Weightbearing: Nonweightbearing on the  right  Pain medication: Opioid pathway ordered  Dressing care/ Wound VAC: Continue wound VAC for 1 week  Ambulatory devices: Walker  Discharge to: Anticipate discharge back to skilled nursing.  Follow-up: In the office 1 week post operative.

## 2018-10-22 NOTE — Transfer of Care (Signed)
Immediate Anesthesia Transfer of Care Note  Patient: Gregory [name]  Procedure(s) Performed: REVISION RIGHT BELOW KNEE AMPUTATION (Right Leg Lower) Application Of Wound Vac (Right Leg Lower)  Patient Location: PACU  Anesthesia Type:General  Level of Consciousness: awake, alert , oriented and sedated  Airway & Oxygen Therapy: Patient Spontanous Breathing and Patient connected to face mask oxygen  Post-op Assessment: Report given to RN, Post -op Vital signs reviewed and stable and Patient moving all extremities   Post vital signs: Reviewed and stable  Last Vitals:  Vitals Value Taken Time  BP 152/96 10/22/2018 12:59 PM  Temp    Pulse 55 10/22/2018  1:03 PM  Resp 18 10/22/2018  1:03 PM  SpO2 96 % 10/22/2018  1:03 PM  Vitals shown include unvalidated device data.  Last Pain:  Vitals:   10/22/18 0859  TempSrc: Oral  PainSc: 9       Patients Stated Pain Goal: 2 (62/82/41 7530)  Complications: No apparent anesthesia complications

## 2018-10-22 NOTE — Plan of Care (Signed)

## 2018-10-22 NOTE — Social Work (Signed)
CSW acknowledging consult for SNF placement. Pt LTC at [facility], will assist with d/c when pt medically stable for d/c.   Westley Hummer, MSW, [place] Work [phone]

## 2018-10-22 NOTE — H&P (Signed)
Gregory [name] is an 58 y.o. male.   Chief ]Complaint: Ulceration osteomyelitis right transtibial amputation. HPI: Patient is a 58 year old gentleman who presents in follow-up for his right transtibial amputation.  By report from skilled nursing they state the patient has been sticking a fork in his wound of the transtibial amputation for pain medicine.  Patient has a ulcer with drainage.  Patient also complains of venous stasis swelling in the left leg despite elevation.  Patient is approximately a year and a half out from his transtibial amputation.  Past Medical History:  Diagnosis Date  . Acquired absence of left great toe ([place])   . Acquired absence of right leg below knee ([place])   . Acute osteomyelitis of calcaneum, right ([place]) 10/02/2016  . Acute respiratory failure ([place])   . Amputation stump infection ([place]) 08/12/2018  . Anemia   . Basal cell carcinoma, eyelid   . Cancer ([place])   . Candidiasis 08/12/2018  . CHF (congestive heart failure) (HCC)    chronic diastolic   . Chronic back pain   . Chronic kidney disease    stage 3   . Chronic multifocal osteomyelitis (HCC)    of ankle and foot   . Chronic pain syndrome   . COPD (chronic obstructive pulmonary disease) ([place])   . DDD (degenerative disc disease), lumbar   . Depression    major depressive disorder   . Diabetes mellitus without complication ([place])    type 2   . Diabetic ulcer of toe of left foot ([place]) 10/02/2016  . Difficulty in walking, not elsewhere classified   . 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 Foot drop, bilateral   . Foot drop, right 12/27/2015  . GERD (gastroesophageal reflux disease)   . Herpesviral vesicular dermatitis   . Hyperlipidemia   . Hyperlipidemia   . Hypertension   . Insomnia   . Malignant melanoma of other parts of face ([place])   . MRSA bacteremia   . Muscle weakness (generalized)   . Nasal congestion   . Necrosis of toe ([place]) 07/08/2018  . Neuromuscular dysfunction of bladder   . Osteomyelitis ([place])    . Osteomyelitis ([place])   . Osteomyelitis ([place])    right BKA  . Other idiopathic peripheral autonomic neuropathy   . Retention of urine, unspecified   . Squamous cell carcinoma of skin   . Unsteadiness on feet   . Urinary retention   . Urinary retention   . Urinary tract infection   . Wears dentures   . Wears glasses     Past Surgical History:  Procedure Laterality Date  . AMPUTATION Left 03/29/2017   Procedure: LEFT GREAT TOE AMPUTATION AT METATARSOPHALANGEAL JOINT;  Surgeon: [name], MD;  Location: [place];  Service: Orthopedics;  Laterality: Left;  . AMPUTATION Right 03/29/2017   Procedure: RIGHT BELOW KNEE AMPUTATION;  Surgeon: [name], MD;  Location: [place];  Service: Orthopedics;  Laterality: Right;  . AMPUTATION Left 06/06/2018   Procedure: LEFT 2ND TOE AMPUTATION;  Surgeon: [name], MD;  Location: [place];  Service: Orthopedics;  Laterality: Left;  . ANTERIOR CERVICAL CORPECTOMY N/A 11/25/2015   Procedure: ANTERIOR CERVICAL FIVE CORPECTOMY Cervical four - six fusion;  Surgeon: Consuella Lose, MD;  Location: [place] NEURO ORS;  Service: Neurosurgery;  Laterality: N/A;  ANTERIOR CERVICAL FIVE CORPECTOMY Cervical four - six fusion  . CYSTOSCOPY WITH RETROGRADE URETHROGRAM N/A 04/25/2018   Procedure: CYSTOSCOPY WITH RETROGRADE URETHROGRAM/ BALLOON DILATION;  Surgeon: Kathie Rhodes, MD;  [facility];  Service: Urology;  Laterality: N/A;  . MULTIPLE TOOTH EXTRACTIONS    . POSTERIOR CERVICAL FUSION/FORAMINOTOMY N/A 11/29/2015   Procedure: Cervical Three-Cervical Seven Posterior Cervical Laminectomy with Fusion;  Surgeon: Consuella Lose, MD;  Location: [place] NEURO ORS;  Service: Neurosurgery;  Laterality: N/A;  Cervical Three-Cervical Seven Posterior Cervical Laminectomy with Fusion    Family History  Problem Relation Age of Onset  . Cancer Other   . Diabetes Other    Social History:  reports that he has been smoking cigarettes. He has been smoking about 0.50 packs  per day. He has never used smokeless tobacco. He reports that he does not drink alcohol or use drugs.  Allergies: No Known Allergies  No medications prior to admission.    No results found for this or any previous visit (from the past 48 hour(s)). Xr Tibia/fibula Right  Result Date: 10/20/2018 2 view radiographs of the right tibia transtibial amputation shows destructive bony changes over the distal anterior cortex consistent with osteomyelitis.   Review of Systems  All other systems reviewed and are negative.   There were no vitals taken for this visit. Physical Exam  Patient is alert, oriented, no adenopathy, well-dressed, normal affect, normal respiratory effort. Examination patient has chronic venous stasis changes in the left lower extremity with massive edema and brawny skin changes.  The area of amputation of the great toe and second toe is healing well with 100% beefy granulation tissue after debridement of the fibrinous exudative tissue.  The ulcerative areas 10 x 20 mm and 5 mm deep there is no exposed bone or tendon no signs of infection.  There are no venous ulcers.  Examination the right transtibial amputation patient has an area of cellulitis approximately 3 cm in diameter with an ulcer over the tip of the transtibial amputation.  This ulcer probes down to bone.  There is drainage from the ulcer. Assessment/Plan 1. Subacute osteomyelitis of right tibia [facility])     Plan: Patient was given instructions to get a pair of medical compression stockings size extra-large for the left lower extremity.  With the osteomyelitis and ulceration with exposed tibia patient's only option is to proceed with a revision of the transtibial amputation.  This should help resolve his pain and anticipated hospitalization would be about 3 to 5 days prior to returning back to skilled nursing.   [name], MD 10/22/2018, 6:53 AM

## 2018-10-22 NOTE — Anesthesia Procedure Notes (Signed)
Procedure Name: LMA Insertion Date/Time: 10/22/2018 12:11 PM Performed by: Scheryl Darter, CRNA Pre-anesthesia Checklist: Patient identified, Emergency Drugs available, Suction available and Patient being monitored Patient Re-evaluated:Patient Re-evaluated prior to induction Oxygen Delivery Method: Circle System Utilized Preoxygenation: Pre-oxygenation with 100% oxygen Induction Type: IV induction Ventilation: Mask ventilation without difficulty LMA: LMA inserted LMA Size: 5.0 Number of attempts: 1 Airway Equipment and Method: Bite block Placement Confirmation: positive ETCO2 Tube secured with: Tape Dental Injury: Teeth and Oropharynx as per pre-operative assessment

## 2018-10-23 ENCOUNTER — Encounter (HOSPITAL_COMMUNITY): Payer: Self-pay | Admitting: Orthopedic Surgery

## 2018-10-23 LAB — GLUCOSE, CAPILLARY
Glucose-Capillary: 118 mg/dL — ABNORMAL HIGH (ref 70–99)
Glucose-Capillary: 166 mg/dL — ABNORMAL HIGH (ref 70–99)
Glucose-Capillary: 151 mg/dL — ABNORMAL HIGH (ref 70–99)
Glucose-Capillary: 140 mg/dL — ABNORMAL HIGH (ref 70–99)

## 2018-10-23 MED ORDER — SALINE SPRAY 0.65 % NA SOLN
1.0000 | NASAL | Status: DC | PRN
  Administered 2018-10-23: 1 via NASAL
  Filled 2018-10-23: qty 44

## 2018-10-23 MED ORDER — KETOROLAC TROMETHAMINE 15 MG/ML IJ SOLN
15.0000 mg | Freq: Four times a day (QID) | INTRAMUSCULAR | Status: DC | PRN
  Administered 2018-10-23 – 2018-10-24 (×4): 15 mg via INTRAVENOUS
  Filled 2018-10-23 (×4): qty 1

## 2018-10-23 MED ORDER — NICOTINE 21 MG/24HR TD PT24
21.0000 mg | MEDICATED_PATCH | Freq: Every day | TRANSDERMAL | Status: DC
  Administered 2018-10-23 – 2018-10-24 (×2): 21 mg via TRANSDERMAL
  Filled 2018-10-23 (×2): qty 1

## 2018-10-23 MED ORDER — ADULT MULTIVITAMIN W/MINERALS CH
1.0000 | ORAL_TABLET | Freq: Every day | ORAL | Status: DC
  Administered 2018-10-23 – 2018-10-24 (×2): 1 via ORAL
  Filled 2018-10-23 (×2): qty 1

## 2018-10-23 MED ORDER — ENSURE MAX PROTEIN PO LIQD
11.0000 [oz_av] | Freq: Two times a day (BID) | ORAL | Status: DC
  Administered 2018-10-24: 10:00:00 11 [oz_av] via ORAL
  Filled 2018-10-23 (×4): qty 330

## 2018-10-23 MED ORDER — MUPIROCIN 2 % EX OINT
1.0000 "application " | TOPICAL_OINTMENT | Freq: Two times a day (BID) | CUTANEOUS | Status: DC
  Administered 2018-10-23 – 2018-10-24 (×3): 1 via NASAL
  Filled 2018-10-23: qty 22

## 2018-10-23 MED ORDER — CHLORHEXIDINE GLUCONATE CLOTH 2 % EX PADS
6.0000 | MEDICATED_PAD | Freq: Every day | CUTANEOUS | Status: DC
  Administered 2018-10-23: 10:00:00 6 via TOPICAL

## 2018-10-23 NOTE — Evaluation (Signed)
Physical Therapy Evaluation & Discharge Patient Details Name: Gregory [name] MRN: [id number] DOB: September 12, 1960 Today's Date: 10/23/2018   History of Present Illness  Pt is a 58 y.o. male admitted from [place] at [facility] on 10/22/18 with R BKA osteomyelitis, now s/p R BKA revision 5/6. 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    Clinical Impression  Patient evaluated by Physical Therapy with no further acute PT needs identified. PTA, pt resides at [facility]; reports indep with wheelchair transfers and ADLs. Pt with tilt w/c but reports he does not use tilt function because he doesn't want to. Educ on importance of tilting/pressure relief, elevation, therex and mobility in general. Also noted w/c seat and cushion soiled; cleaned accordingly, educ on importance of this as well. Pt not interested in getting out of bed today due to pain; does not wish to work with acute PT while admitted. Will sign off. Please reconsult if new needs arise.    Follow Up Recommendations (return to SNF)    Equipment Recommendations  None recommended by PT    Recommendations for Other Services       Precautions / Restrictions Precautions Precautions: Fall Precaution Comments: R residual limb wound vac Restrictions Weight Bearing Restrictions: Yes RLE Weight Bearing: Non weight bearing      Mobility  Bed Mobility               General bed mobility comments: Able to pull self up on bed, reliant on use of bed rails  Transfers                 General transfer comment: Declined due to pain  Ambulation/Gait                Stairs            Wheelchair Mobility    Modified Rankin (Stroke Patients Only)       Balance                                             Pertinent Vitals/Pain Pain Assessment: Faces Faces Pain Scale: Hurts little more Pain Location: R residual limb Pain  Descriptors / Indicators: Discomfort Pain Intervention(s): Limited activity within patient's tolerance;RN gave pain meds during session    Home Living Family/patient expects to be discharged to:: Skilled nursing facility                 Additional Comments: Resides at [facility]    Prior Function Level of Independence: Needs assistance   Gait / Transfers Assistance Needed: Pt reports indep to stand on LLE and transfer to his tilt w/c. Does not perform tilting (  ADL's / Homemaking Assistance Needed: Reports can dress/bathe self- goes into the shower        Hand Dominance   Dominant Hand: Right    Extremity/Trunk Assessment   Upper Extremity Assessment Upper Extremity Assessment: Overall WFL for tasks assessed    Lower Extremity Assessment Lower Extremity Assessment: RLE deficits/detail;LLE deficits/detail RLE Deficits / Details: R hip flex/ext/abd/add at least 3/5 LLE Deficits / Details: Hip/knee at least 3/5; limited ankle ROM, unable to move toes (baseline)       Communication      Cognition Arousal/Alertness: Awake/alert Behavior During Therapy: WFL for tasks assessed/performed Overall Cognitive Status: Within Functional Limits  for tasks assessed                                        General Comments General comments (skin integrity, edema, etc.): SpO2 maintaining 90-94% on RA, O2 [place] removed (RN present and aware)    Exercises General Exercises - Lower Extremity Hip ABduction/ADduction: AROM;Both;Supine Straight Leg Raises: AROM;Both;Supine   Assessment/Plan    PT Assessment Patent does not need any further PT services  PT Problem List         PT Treatment Interventions      PT Goals (Current goals can be found in the Care Plan section)  Acute Rehab PT Goals PT Goal Formulation: All assessment and education complete, DC therapy    Frequency     Barriers to discharge        Co-evaluation               AM-PAC PT  "6 Clicks" Mobility  Outcome Measure Help needed turning from your back to your side while in a flat bed without using bedrails?: A Little Help needed moving from lying on your back to sitting on the side of a flat bed without using bedrails?: A Little Help needed moving to and from a bed to a chair (including a wheelchair)?: A Lot Help needed standing up from a chair using your arms (e.g., wheelchair or bedside chair)?: A Lot Help needed to walk in hospital room?: A Lot Help needed climbing 3-5 steps with a railing? : Total 6 Click Score: 13    End of Session   Activity Tolerance: Patient limited by pain Patient left: in bed;with call bell/phone within reach;with bed alarm set Nurse Communication: Mobility status PT Visit Diagnosis: Other abnormalities of gait and mobility (R26.89)    Time: 9038-3338 PT Time Calculation (min) (ACUTE ONLY): 19 min   Charges:   PT Evaluation $PT Eval Moderate Complexity: [place], PT, DPT Acute Rehabilitation Services  Pager [phone] Office [place] 10/23/2018, 10:18 AM

## 2018-10-23 NOTE — Progress Notes (Signed)
Initial Nutrition Assessment  RD working remotely.  DOCUMENTATION CODES:   Obesity unspecified  INTERVENTION:   -Ensure Max po BID, each supplement provides 150 kcal and 30 grams of protein.  -MVI with minerals daily  NUTRITION DIAGNOSIS:   Increased nutrient needs related to wound healing as evidenced by estimated needs.  GOAL:   Patient will meet greater than or equal to 90% of their needs  MONITOR:   PO intake, Supplement acceptance, Diet advancement, Labs, Weight trends, Skin, I & O's  REASON FOR ASSESSMENT:   Other (Comment)    ASSESSMENT:   Patient is a 58 year old gentleman who presents in follow-up for his right transtibial amputation.  By report from skilled nursing they state the patient has been sticking a fork in his wound of the transtibial amputation for pain medicine.  Patient has a ulcer with drainage.  Patient also complains of venous stasis swelling in the left leg despite elevation.  Pt admitted with subacute osteomyelitis of rt tibia.   5/6- s/p rt BKA revision with wound vac placement  Reviewed I/O's: -708 ml x 24 hours  UOP: 2.1 L x 24 hours  Pt with good appetite; meal completion 100%.   Per wt records, wt has been stable over the past year. Per CareEverywhere records, noted slight wt gain over the past year (wt recorded at 206# on 11/15/17).   Given increased protein needs for wound healing with wound vac, pt would benefit from addition of oral nutrition supplements.   Per CSW notes, pt will return to SNF at discharge.   Lab Results  Component Value Date   HGBA1C 8.0 (H) 06/04/2018   PTA DM medications are 10 mg glipizide q AM and 1-10 units insulin lispro TID with meals.   Labs reviewed: CBGS: 97-198 (inpatient orders for glycemic control are 10 mg glipizide q AM, 0-15 units insulin aspart TID with meals, and 4 units insulin aspart TID with meals).   NUTRITION - FOCUSED PHYSICAL EXAM:    Most Recent Value  Orbital Region  Unable to  assess  Upper Arm Region  Unable to assess  Thoracic and Lumbar Region  Unable to assess  Buccal Region  Unable to assess  Temple Region  Unable to assess  Clavicle Bone Region  Unable to assess  Clavicle and Acromion Bone Region  Unable to assess  Scapular Bone Region  Unable to assess  Dorsal Hand  Unable to assess  Patellar Region  Unable to assess  Anterior Thigh Region  Unable to assess  Posterior Calf Region  Unable to assess  Edema (RD Assessment)  Unable to assess  Hair  Unable to assess  Eyes  Unable to assess  Mouth  Unable to assess  Skin  Unable to assess  Nails  Unable to assess       Diet Order:   Diet Order            Diet Carb Modified Fluid consistency: Thin; Room service appropriate? Yes  Diet effective now              EDUCATION NEEDS:   No education needs have been identified at this time  Skin:  Skin Assessment: Skin Integrity Issues: Skin Integrity Issues:: Wound VAC Wound Vac: rt BKA site  Last BM:  10/21/18  Height:   Ht Readings from Last 1 Encounters:  10/22/18 6' (1.829 m)    Weight:   Wt Readings from Last 1 Encounters:  10/22/18 112 kg    Ideal Body  Weight:  75.7 kg  BMI:  Body mass index is 33.5 kg/m.  Estimated Nutritional Needs:   Kcal:  2000-2200  Protein:  110-125 grams  Fluid:  > 2.2 L    Wen Munford A. Jimmye Norman, RD, LDN, [place] Registered Dietitian II Certified Diabetes Care and Education Specialist Pager: [phone] After hours Pager: [phone]

## 2018-10-23 NOTE — Anesthesia Postprocedure Evaluation (Signed)
Anesthesia Post Note  Patient: Gregory [name]  Procedure(s) Performed: REVISION RIGHT BELOW KNEE AMPUTATION (Right Leg Lower) Application Of Wound Vac (Right Leg Lower)     Patient location during evaluation: PACU Anesthesia Type: General Level of consciousness: awake and alert Pain management: pain level controlled Vital Signs Assessment: post-procedure vital signs reviewed and stable Respiratory status: spontaneous breathing, nonlabored ventilation, respiratory function stable and patient connected to nasal cannula oxygen Cardiovascular status: blood pressure returned to baseline and stable Postop Assessment: no apparent nausea or vomiting Anesthetic complications: no    Last Vitals:  Vitals:   10/23/18 0239 10/23/18 0504  BP: 130/64 126/72  Pulse: 67 64  Resp: 18 16  Temp: 36.7 C 36.9 C  SpO2: 92% 93%    Last Pain:  Vitals:   10/23/18 0750  TempSrc:   PainSc: 7                  Valecia Beske S

## 2018-10-23 NOTE — Progress Notes (Signed)
Checked on patient and noted wound vac was alarming. Noted patient trying to take dressing off and instructed him not to. Reinforced tegaderm and wound vac stopped alarming. Education pt on purpose of wound vac.

## 2018-10-23 NOTE — Progress Notes (Signed)
Notified on call doctor Dr. Garnette Czech on patient uncontrolled pain with pain medication. New order received and stated to check with pharmacy to see if Toradol 15mg  IVP was ok to give based on weight and renal function.

## 2018-10-23 NOTE — TOC Initial Note (Signed)
Transition of Care [facility]) - Initial/Assessment Note    Patient Details  Name: Gregory [name] MRN: [id number] Date of Birth: 08-Nov-1960  [facility] CM/SW Contact:    Alexander Mt, [place] Phone Number: 10/23/2018, 11:40 AM  Clinical Narrative:                 CSW spoke with pt at bedside. Introduced self, role, reason for visit. Pt from [facility] where he is a LTC resident Rene Paci been there a few years"). He has an adult daughter that he keeps in contact with and gives permission for her to be contacted as needed.   Pt expresses that he is uncomfortable with the vac. CSW encouraged pt to not pick at it and to address any discomfort with the RN and MD when they round for him.  Pt states the doctor told him "5 days" that he would be here at the hospital. Emphasized that whenever [name] decides he is medically ready to return to SNF that we would at that time have to discharge pt back to SNF. Pt states understanding.  Continuing to follow.   Expected Discharge Plan: Skilled Nursing Facility Barriers to Discharge: Continued Medical Work up   Patient Goals and CMS Choice Patient states their goals for this hospitalization and ongoing recovery are:: to have less pain CMS Medicare.gov Compare Post Acute Care list provided to:: Patient Choice offered to / list presented to : Patient  Expected Discharge Plan and Services Expected Discharge Plan: [place] In-house Referral: NA Discharge Planning Services: NA Post Acute Care Choice: [place], Resumption of Svcs/PTA Provider Living arrangements for the past 2 months: [place]                                      Prior Living Arrangements/Services Living arrangements for the past 2 months: [place] Lives with:: Facility Resident Patient language and need for interpreter reviewed:: Yes(no needs) Do you feel safe going back to the place where you  live?: Yes      Need for Family Participation in Patient Care: No (Comment)(facility resident) Care giver support system in place?: Yes (comment)(facility and adult daughter) Current home services: DME Criminal Activity/Legal Involvement Pertinent to Current Situation/Hospitalization: No - Comment as needed  Activities of Daily Living Home Assistive Devices/Equipment: Eyeglasses, Wheelchair, CBG Meter ADL Screening (condition at time of admission) Patient's cognitive ability adequate to safely complete daily activities?: Yes Is the patient deaf or have difficulty hearing?: Yes Does the patient have difficulty seeing, even when wearing glasses/contacts?: Yes Does the patient have difficulty concentrating, remembering, or making decisions?: No Patient able to express need for assistance with ADLs?: Yes Does the patient have difficulty dressing or bathing?: No Independently performs ADLs?: Yes (appropriate for developmental age) Does the patient have difficulty walking or climbing stairs?: Yes Weakness of Legs: Both Weakness of Arms/Hands: None  Permission Sought/Granted Permission sought to share information with : Facility Sport and exercise psychologist, Family Supports Permission granted to share information with : Yes, Verbal Permission Granted  Share Information with NAME: [name]  Permission granted to share info w AGENCY: [place] granted to share info w Relationship: daughter  Permission granted to share info w Contact Information: [phone]  Emotional Assessment Appearance:: Appears stated age Attitude/Demeanor/Rapport: Self-Absorbed, Engaged Affect (typically observed): Accepting, Appropriate, Restless Orientation: : Oriented to Self, Oriented to  Place, Oriented to  Time, Oriented to Situation Alcohol / Substance Use: Not Applicable Psych Involvement: No (comment)  Admission diagnosis:  Osteomyelitis Right Below Knee Amptuation Patient Active  Problem List   Diagnosis Date Noted  . Below-knee amputation of right lower extremity ([place]) 10/22/2018  . Dehiscence of amputation stump ([place])   . Amputation stump infection ([place]) 08/12/2018  . Candidiasis 08/12/2018  . Necrosis of toe ([place]) 07/08/2018  . Amputated toe of left foot ([place]) 06/18/2018  . Streptococcal infection   . Pressure injury of skin 06/06/2018  . Cutaneous abscess of left foot   . Severe protein-calorie malnutrition ([place])   . Cellulitis of left leg   . Septic arthritis ([place]) 06/03/2018  . Idiopathic chronic venous hypertension of left lower extremity with ulcer and inflammation ([place]) 11/14/2017  . Great toe amputation status, left 11/14/2017  . Acquired absence of right leg below knee ([place]) 06/20/2017  . Osteomyelitis ([place]) 03/25/2017  . Skin cancer 03/25/2017  . Depression, recurrent ([place]) 11/05/2016  . Diabetic ulcer of toe of left foot ([place]) 10/02/2016  . 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 Chronic pain 09/27/2016  . Dyslipidemia associated with type 2 diabetes mellitus ([place]) 06/06/2016  . GERD without esophagitis 05/11/2016  . Hypertensive heart disease with congestive heart failure ([place]) 12/15/2015  . Spinal stenosis in cervical region 12/15/2015  . Type II diabetes mellitus with neurological manifestations ([place]) 12/15/2015  . Chronic diastolic CHF (congestive heart failure) ([place])   . Pulmonary hypertension ([place])   . Urinary retention   . Diskitis   . Foot drop, bilateral   . Sepsis with acute renal failure without septic shock ([place])   . MRSA bacteremia 10/03/2015  . COPD (chronic obstructive pulmonary disease) ([place]) 10/03/2015  . Acute osteomyelitis of left foot ([place]) 10/03/2015   PCP:  [name], MD Pharmacy:  No Pharmacies Listed    Social Determinants of Health (SDOH) Interventions    Readmission Risk Interventions No flowsheet data found.

## 2018-10-23 NOTE — Progress Notes (Signed)
Called pharmacy and spoke with pharmacist to see if pt can have Toradol 15mg  IVP based on weight and renal function. Pharmacist Jeneen Rinks stated it was "ok" for patient to have.

## 2018-10-23 NOTE — Progress Notes (Signed)
Patient ID: Gregory [name], male   DOB: Apr 21, 1961, 58 y.o.   MRN: [id number] Postoperative day 1 revision transtibial amputation.  Tissue cultures and bone cultures are pending.  There is no drainage in the wound VAC canister.  Patient is resting comfortably but after awakening patient states that he is in significant pain.  Plan for discharge back to skilled nursing.

## 2018-10-23 NOTE — NC FL2 (Signed)
[place] LEVEL OF CARE SCREENING TOOL     IDENTIFICATION  Patient Name: Gregory [name] Birthdate: 06-02-1961 Sex: male Admission Date (Current Location): 10/22/2018  [place] and [place] Number:  [name] [phone] [place] and Address:  [facility] [place], [place] [address] [place], [place] [address]      Provider Number: [id number]  Attending Physician Name and Address:  [name], MD  Relative Name and Phone Number:  [name], daughter, [phone]    Current Level of Care: Hospital Recommended Level of Care: [place] Prior Approval Number:    Date Approved/Denied:   PASRR Number: [id number]  Discharge Plan: SNF    Current Diagnoses: Patient Active Problem List   Diagnosis Date Noted  . Below-knee amputation of right lower extremity ([place]) 10/22/2018  . Dehiscence of amputation stump ([place])   . Amputation stump infection ([place]) 08/12/2018  . Candidiasis 08/12/2018  . Necrosis of toe ([place]) 07/08/2018  . Amputated toe of left foot ([place]) 06/18/2018  . Streptococcal infection   . Pressure injury of skin 06/06/2018  . Cutaneous abscess of left foot   . Severe protein-calorie malnutrition ([place])   . Cellulitis of left leg   . Septic arthritis ([place]) 06/03/2018  . 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 Great toe amputation status, left 11/14/2017  . Acquired absence of right leg below knee ([place]) 06/20/2017  . Osteomyelitis ([place]) 03/25/2017  . Skin cancer 03/25/2017  . Depression, recurrent ([place]) 11/05/2016  . Diabetic ulcer of toe of left foot ([place]) 10/02/2016  . 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 Chronic pain 09/27/2016  . Dyslipidemia associated with type 2 diabetes mellitus ([place]) 06/06/2016  . GERD without esophagitis 05/11/2016  . Hypertensive heart disease with congestive heart failure ([place]) 12/15/2015  . Spinal stenosis in cervical region  12/15/2015  . Type II diabetes mellitus with neurological manifestations ([place]) 12/15/2015  . Chronic diastolic CHF (congestive heart failure) ([place])   . Pulmonary hypertension ([place])   . Urinary retention   . Diskitis   . Foot drop, bilateral   . Sepsis with acute renal failure without septic shock ([place])   . MRSA bacteremia 10/03/2015  . COPD (chronic obstructive pulmonary disease) ([place]) 10/03/2015  . Acute osteomyelitis of left foot ([place]) 10/03/2015    Orientation RESPIRATION BLADDER Height & Weight     Self, Time, Situation, Place  O2(nasal canula 2L) Continent Weight: 247 lb (112 kg) Height:  6' (182.9 cm)  BEHAVIORAL SYMPTOMS/MOOD NEUROLOGICAL BOWEL NUTRITION STATUS      Continent Diet(carb modified diet; thin liquids)  AMBULATORY STATUS COMMUNICATION OF NEEDS Skin   Limited Assist Verbally Surgical wounds, Wound Vac(incision on right leg (previous amputation) with wound vac running at 125)                       Personal Care Assistance Level of Assistance  Feeding, Dressing, Bathing Bathing Assistance: Limited assistance Feeding assistance: Independent Dressing Assistance: Limited assistance     Functional Limitations Info  Sight, Hearing, Speech Sight Info: Adequate Hearing Info: Adequate Speech Info: Adequate    SPECIAL CARE FACTORS FREQUENCY                       Contractures Contractures Info: Not present    Additional Factors Info  Code Status, Allergies, Psychotropic, Insulin Sliding Scale Code Status Info: Full Code Allergies Info: No Active Allergies  Psychotropic Info: buPROPion (WELLBUTRIN XL) 24 hr tablet 300 mg daily at bedtime PO; zolpidem (AMBIEN) tablet 5 mg daily at bedtime PO Insulin Sliding Scale Info: insulin aspart (novoLOG) injection 0-15 Units 3x daily with meals; insulin aspart (novoLOG) injection 4 Units 3x daily with meals       Current Medications (10/23/2018):  This is the current hospital active medication list Current  Facility-Administered Medications  Medication Dose Route Frequency Provider Last Rate Last Dose  . 0.9 %  sodium chloride infusion   Intravenous Continuous [name], MD 10 mL/hr at 10/22/18 1637    . acetaminophen (TYLENOL) tablet 325-650 mg  325-650 mg Oral Q6H PRN [name], MD      . albuterol (PROVENTIL) (2.5 MG/3ML) 0.083% nebulizer solution 3 mL  3 mL Inhalation Q6H PRN [name], MD      . amLODipine (NORVASC) tablet 10 mg  10 mg Oral Daily [name], MD   10 mg at 10/23/18 0950  . atorvastatin (LIPITOR) tablet 20 mg  20 mg Oral QHS [name], MD   20 mg at 10/22/18 2127  . bisacodyl (DULCOLAX) suppository 10 mg  10 mg Rectal Daily PRN [name], MD      . buPROPion (WELLBUTRIN XL) 24 hr tablet 300 mg  300 mg Oral QHS [name], MD   300 mg at 10/22/18 2127  . Chlorhexidine Gluconate Cloth 2 % PADS 6 each  6 each Topical Q0600 [name], MD   6 each at 10/23/18 [phone]  . diphenhydrAMINE (BENADRYL) 12.5 MG/5ML elixir 12.5-25 mg  12.5-25 mg Oral Q4H PRN [name], MD      . docusate sodium (COLACE) capsule 100 mg  100 mg Oral BID [name], MD   100 mg at 10/23/18 0950  . finasteride (PROSCAR) tablet 5 mg  5 mg Oral Daily [name], MD   5 mg at 10/23/18 0950  . furosemide (LASIX) tablet 40 mg  40 mg Oral Daily [name], MD   40 mg at 10/23/18 0950  . glipiZIDE (GLUCOTROL) tablet 10 mg  10 mg Oral QAC breakfast [name], MD   10 mg at 10/23/18 0752  . HYDROmorphone (DILAUDID) injection 0.5-1 mg  0.5-1 mg Intravenous Q4H PRN [name], MD   1 mg at 10/23/18 0958  . insulin aspart (novoLOG) injection 0-15 Units  0-15 Units Subcutaneous TID WC [name], MD      . insulin aspart (novoLOG) injection 4 Units  4 Units Subcutaneous TID WC [name], MD      . ketorolac (TORADOL) 15 MG/ML injection 15 mg  15 mg Intravenous Q6H PRN Garald Balding, MD   15 mg at 10/23/18 1105  . magnesium citrate solution 1 Bottle  1  Bottle Oral Once PRN [name], MD      . methocarbamol (ROBAXIN) tablet 500 mg  500 mg Oral Q6H PRN [name], MD   500 mg at 10/23/18 1118   Or  . methocarbamol (ROBAXIN) 500 mg in dextrose 5 % 50 mL IVPB  500 mg Intravenous Q6H PRN [name], MD      . metoCLOPramide (REGLAN) tablet 5-10 mg  5-10 mg Oral Q8H PRN [name], MD       Or  . metoCLOPramide (REGLAN) injection 5-10 mg  5-10 mg Intravenous Q8H PRN [name], MD      .  metoprolol tartrate (LOPRESSOR) tablet 25 mg  25 mg Oral Daily [name], MD   25 mg at 10/23/18 0950  . mirabegron ER (MYRBETRIQ) tablet 50 mg  50 mg Oral Daily [name], MD   50 mg at 10/23/18 0949  . morphine (MS CONTIN) 12 hr tablet 15 mg  15 mg Oral Q12H [name], MD   15 mg at 10/23/18 0950  . mupirocin ointment (BACTROBAN) 2 % 1 application  1 application Nasal BID [name], MD   1 application at 72/90/21 0950  . nicotine (NICODERM CQ - dosed in mg/24 hours) patch 21 mg  21 mg Transdermal Daily [name], MD   21 mg at 10/23/18 1119  . ondansetron (ZOFRAN) tablet 4 mg  4 mg Oral Q6H PRN [name], MD       Or  . ondansetron [facility]) injection 4 mg  4 mg Intravenous Q6H PRN [name], MD      . oxybutynin (DITROPAN XL) 24 hr tablet 15 mg  15 mg Oral QHS [name], MD   15 mg at 10/22/18 2127  . oxyCODONE (Oxy IR/ROXICODONE) immediate release tablet 20 mg  20 mg Oral Q6H PRN [name], MD   20 mg at 10/23/18 0752  . PARoxetine (PAXIL) tablet 20 mg  20 mg Oral QHS [name], MD   20 mg at 10/22/18 2127  . polyethylene glycol (MIRALAX / GLYCOLAX) packet 17 g  17 g Oral Daily PRN [name], MD      . pregabalin (LYRICA) capsule 150 mg  150 mg Oral BID [name], MD   150 mg at 10/23/18 0950  . sodium chloride (OCEAN) 0.65 % nasal spray 1 spray  1 spray Each Nare PRN [name], MD   1 spray at 10/23/18 1119  . tamsulosin (FLOMAX) capsule 0.8 mg  0.8 mg Oral QPM [name],  MD   0.8 mg at 10/22/18 1633  . terbinafine (LAMISIL) tablet 250 mg  250 mg Oral Daily [name], MD   250 mg at 10/23/18 0949  . umeclidinium bromide (INCRUSE ELLIPTA) 62.5 MCG/INH 1 puff  1 puff Inhalation Daily Alvira Philips, Bath   1 puff at 10/23/18 1155  . zolpidem (AMBIEN) tablet 5 mg  5 mg Oral QHS [name], MD   5 mg at 10/22/18 2127     Discharge Medications: Please see discharge summary for a list of discharge medications.  Relevant Imaging Results:  Relevant Lab Results:   Additional Information SS#:[id number] [place], [place]

## 2018-10-24 LAB — GLUCOSE, CAPILLARY: Glucose-Capillary: 105 mg/dL — ABNORMAL HIGH (ref 70–99)

## 2018-10-24 MED ORDER — OXYCODONE HCL 20 MG PO TABS: 20.0000 mg | ORAL_TABLET | Freq: Four times a day (QID) | ORAL | 0 refills | Status: DC | PRN

## 2018-10-24 MED ORDER — MORPHINE SULFATE ER 15 MG PO TBCR: 15.0000 mg | EXTENDED_RELEASE_TABLET | Freq: Two times a day (BID) | ORAL | 0 refills | Status: DC

## 2018-10-24 MED ORDER — PREGABALIN 150 MG PO CAPS: 150.0000 mg | ORAL_CAPSULE | Freq: Two times a day (BID) | ORAL | 0 refills | Status: DC

## 2018-10-24 MED ORDER — DOXYCYCLINE HYCLATE 100 MG PO CAPS: 100.0000 mg | ORAL_CAPSULE | Freq: Two times a day (BID) | ORAL | 0 refills | Status: DC

## 2018-10-24 NOTE — Plan of Care (Signed)
  Problem: Health Behavior/Discharge Planning: Goal: Ability to manage health-related needs will improve Outcome: Progressing   Problem: Clinical Measurements: Goal: Will remain free from infection Outcome: Progressing   

## 2018-10-24 NOTE — Discharge Instructions (Signed)
Keep Prevena VAC machine plugged into wall outlet as much as possible to keep charged.

## 2018-10-24 NOTE — Progress Notes (Addendum)
Subjective: 2 Days Post-Op Procedure(s) (LRB): REVISION RIGHT BELOW KNEE AMPUTATION (Right) Application Of Wound Vac (Right) Patient reports pain as mild and moderate.    Objective: Vital signs in last 24 hours: Temp:  [97.5 F (36.4 C)-98.2 F (36.8 C)] 97.7 F (36.5 C) (05/08 9758) Pulse Rate:  [53-61] 57 (05/08 0608) Resp:  [16] 16 (05/08 0608) BP: (122-131)/(60-64) 131/63 (05/08 0608) SpO2:  [97 %-98 %] 97 % (05/08 0723)  Intake/Output from previous day: 05/07 0701 - 05/08 0700 In: -  Out: 1500 [Urine:1500] Intake/Output this shift: No intake/output data recorded.  Recent Labs    10/22/18 0842  HGB 10.3*   Recent Labs    10/22/18 0842  WBC 9.1  RBC 4.16*  HCT 34.4*  PLT 228   Recent Labs    10/22/18 0842  NA 138  K 4.0  CL 102  CO2 25  BUN 20  CREATININE 1.51*  GLUCOSE 124*  CALCIUM 8.8*   No results for input(s): LABPT, INR in the last 72 hours.  Right transtibial amputation site with VAC dressing in place and functioning well. No drainage in VAC canister.    Assessment/Plan: 2 Days Post-Op Procedure(s) (LRB): REVISION RIGHT BELOW KNEE AMPUTATION (Right) Application Of Wound Vac (Right) Continue VAC therapy. Up with PT/OT Plan DC back to SNF with [employer]  Erlinda Hong, [name] 10/24/2018, 8:26 AM  [place]

## 2018-10-24 NOTE — Social Work (Addendum)
12:24pm- vitals sent to facility, no indications for covid test prior to dc therefore SNF can admit. PTAR rescheduled, pt wheelchair to be picked up by facility who will call the front desk when ready.   10:15am- SNF now requesting COVID test prior to readmit. Pt was direct admit therefore did not get test through ED, have requested one ordered by attending team PA Shawn. Put PTAR on hold at this time  9:44am-Clinical Social Worker facilitated patient discharge including contacting patient family and facility to confirm patient discharge plans.  Clinical information faxed to facility and family agreeable with plan.  CSW arranged ambulance transport via PTAR to [facility] [place] to call [phone]  with report prior to discharge.  Clinical Social Worker will sign off for now as social work intervention is no longer needed. Please consult [place] again if new need arises.  Westley Hummer, MSW, [place] Social Worker [phone]

## 2018-10-24 NOTE — TOC Transition Note (Signed)
Transition of Care [facility]) - CM/SW Discharge Note   Patient Details  Name: Gregory [name] MRN: [id number] Date of Birth: Sep 11, 1960  Transition of Care [facility]) CM/SW Contact:  Alexander Mt, [place] Phone Number: 10/24/2018, 9:41 AM   Clinical Narrative:    Pt medically cleared for d/c back to LTC SNF, clinicals sent through hub and PTAR arranged for 11am. Scripts and PTAR papers on chart.   Final next level of care: Skilled Nursing Facility Barriers to Discharge: Barriers Resolved   Patient Goals and CMS Choice Patient states their goals for this hospitalization and ongoing recovery are:: to have less pain CMS Medicare.gov Compare Post Acute Care list provided to:: Patient Choice offered to / list presented to : Patient  Discharge Placement   Existing PASRR number confirmed : 10/23/18          Patient chooses bed at: Other - please specify in the comment section below: Patient to be transferred to facility by: [place] Name of family member notified: pt responsible for self, A&Ox4 Patient and family notified of of transfer: 10/24/18  Discharge Plan and Services In-house Referral: NA Discharge Planning Services: NA Post Acute Care Choice: [place], Resumption of Svcs/PTA Provider                               Social Determinants of Health (SDOH) Interventions     Readmission Risk Interventions No flowsheet data found.

## 2018-10-24 NOTE — Progress Notes (Signed)
Portable Prevenna wound vac in place and appears to be operating without any problems.  Discharge packet will be sent to [facility] via [place].  The patient is aware of the discharge today.  Report was given to [place] Loss adjuster, chartered)

## 2018-10-24 NOTE — Discharge Summary (Addendum)
Discharge Diagnoses:  Active Problems:   Dehiscence of amputation stump (HCC)   Below-knee amputation of right lower extremity (HCC)   Surgeries: Procedure(s): REVISION RIGHT BELOW KNEE AMPUTATION Application Of Wound Vac on 10/22/2018    Consultants:   Discharged Condition: Improved Patient is afebrile and has no symptoms consistent with Covid 19 infection at this time.    Hospital Course: Gregory [name] is an 58 y.o. male who was admitted 10/22/2018 with a chief complaint of infection of right transtibial amputation site, with a final diagnosis of Osteomyelitis Right Below Knee Amptuation.  Patient was brought to the operating room on 10/22/2018 and underwent Procedure(s): REVISION RIGHT BELOW KNEE AMPUTATION Application Of Wound Vac.    Patient was given perioperative antibiotics:  Anti-infectives (From admission, onward)   Start     Dose/Rate Route Frequency Ordered Stop   10/24/18 0000  doxycycline (VIBRAMYCIN) 100 MG capsule     100 mg Oral 2 times daily 10/24/18 0900     10/23/18 1000  terbinafine (LAMISIL) tablet 250 mg     250 mg Oral Daily 10/22/18 1546     10/22/18 2000  ceFAZolin (ANCEF) IVPB 2g/100 mL premix     2 g 200 mL/hr over 30 Minutes Intravenous Every 6 hours 10/22/18 1546 10/23/18 0828   10/22/18 1145  ceFAZolin (ANCEF) 2-4 GM/100ML-% IVPB    Note to Pharmacy:  Ebbie Latus   : cabinet override      10/22/18 1145 10/22/18 2359    .  Patient was given sequential compression devices, early ambulation, and aspirin for DVT prophylaxis.  Recent vital signs:  Patient Vitals for the past 24 hrs:  BP Temp Temp src Pulse Resp SpO2  10/24/18 0855 131/63 - - (!) 57 - -  10/24/18 0854 131/63 - - - - -  10/24/18 0723 - - - - - 97 %  10/24/18 0608 131/63 97.7 F (36.5 C) Oral (!) 57 16 97 %  10/23/18 2012 125/64 (!) 97.5 F (36.4 C) Oral (!) 53 16 97 %  10/23/18 1509 122/60 98.2 F (36.8 C) Axillary 61 16 98 %  .  Recent laboratory studies: No results  found.  Discharge Medications:   Allergies as of 10/24/2018   No Active Allergies     Medication List    TAKE these medications   acetaminophen 325 MG tablet Commonly known as:  TYLENOL Take 650 mg by mouth every 4 (four) hours as needed (general discomfort).   albuterol 108 (90 Base) MCG/ACT inhaler Commonly known as:  VENTOLIN HFA Inhale 2 puffs into the lungs every 6 (six) hours as needed for wheezing or shortness of breath.   amLODipine 10 MG tablet Commonly known as:  NORVASC Take 10 mg by mouth daily. Give 1 tablet by mouth one time a day for HTN   atorvastatin 20 MG tablet Commonly known as:  LIPITOR Take 20 mg by mouth at bedtime.   bisacodyl 10 MG suppository Commonly known as:  DULCOLAX Place 1 suppository (10 mg total) rectally daily as needed for moderate constipation.   buPROPion 300 MG 24 hr tablet Commonly known as:  WELLBUTRIN XL Take 300 mg by mouth at bedtime.   calcium carbonate 500 MG chewable tablet Commonly known as:  TUMS - dosed in mg elemental calcium Chew 2 tablets by mouth every 8 (eight) hours as needed for indigestion or heartburn.   cetirizine 10 MG tablet Commonly known as:  ZYRTEC Take 10 mg by mouth daily.   CETYLPYRIDINIUM  CHLORIDE MT Use as directed 15 mLs in the mouth or throat every 4 (four) hours as needed (dry mouth).   doxycycline 100 MG capsule Commonly known as:  VIBRAMYCIN Take 1 capsule (100 mg total) by mouth 2 (two) times daily.   eszopiclone 1 MG Tabs tablet Commonly known as:  LUNESTA Take 1 mg by mouth at bedtime. Take immediately before bedtime   finasteride 5 MG tablet Commonly known as:  PROSCAR Take 5 mg by mouth daily.   furosemide 40 MG tablet Commonly known as:  LASIX Take 40 mg by mouth daily.   glipiZIDE 10 MG tablet Commonly known as:  GLUCOTROL Take 10 mg by mouth daily before breakfast.   Glucagon Emergency 1 MG injection Generic drug:  glucagon Inject 1 mg into the muscle once as needed (for  CBG <70).   guaiFENesin 600 MG 12 hr tablet Commonly known as:  MUCINEX Take 600 mg by mouth every 12 (twelve) hours as needed for cough (congestion).   HumaLOG KwikPen 100 UNIT/ML KwikPen Generic drug:  insulin lispro Inject 0-10 Units into the skin See admin instructions. Inject per sliding scale 3 times daily before meals; CBG <70 = notify MD, 0-150 = 0 units, 151-200 = 2 units, 201-250 = 4 units, 251-300 = 6 units, 301-350 = 8 units, 351-400 = 10 units, >400 = notify MD   lidocaine 5 % Commonly known as:  LIDODERM Place 1 patch onto the skin daily. Apply to Right stump. Remove & Discard patch within 12 hours or as directed by MD   magnesium oxide 400 MG tablet Commonly known as:  MAG-OX Take 400 mg by mouth 2 (two) times daily.   metoprolol tartrate 25 MG tablet Commonly known as:  LOPRESSOR Take 25 mg by mouth daily.   mirabegron ER 50 MG Tb24 tablet Commonly known as:  MYRBETRIQ Take 50 mg by mouth daily.   morphine 15 MG 12 hr tablet Commonly known as:  MS CONTIN Take 1 tablet (15 mg total) by mouth every 12 (twelve) hours.   Nasonex 50 MCG/ACT nasal spray Generic drug:  mometasone Place 1 spray into the nose at bedtime.   omeprazole 40 MG capsule Commonly known as:  PRILOSEC Take 40 mg by mouth daily.   ondansetron 4 MG tablet Commonly known as:  ZOFRAN Take 4 mg by mouth every 6 (six) hours as needed for nausea or vomiting.   oxybutynin 15 MG 24 hr tablet Commonly known as:  DITROPAN XL Take 15 mg by mouth at bedtime.   Oxycodone HCl 20 MG Tabs Take 1 tablet (20 mg total) by mouth every 6 (six) hours as needed (pain).   PARoxetine 20 MG tablet Commonly known as:  PAXIL Take 20 mg by mouth at bedtime.   polyethylene glycol 17 g packet Commonly known as:  MIRALAX / GLYCOLAX Take 17 g by mouth daily as needed for mild constipation.   pregabalin 150 MG capsule Commonly known as:  LYRICA Take 1 capsule (150 mg total) by mouth 2 (two) times daily.    senna-docusate 8.6-50 MG tablet Commonly known as:  Senokot-S Take 2 tablets by mouth at bedtime.   Spiriva Respimat 1.25 MCG/ACT Aers Generic drug:  Tiotropium Bromide Monohydrate Inhale 2 puffs into the lungs daily.   tamsulosin 0.4 MG Caps capsule Commonly known as:  FLOMAX Take 0.8 mg by mouth every evening.   terbinafine 250 MG tablet Commonly known as:  LAMISIL Take 250 mg by mouth daily.   tiZANidine 4  MG tablet Commonly known as:  ZANAFLEX Take 4 mg by mouth every 8 (eight) hours as needed for muscle spasms.   vitamin B-12 1000 MCG tablet Commonly known as:  CYANOCOBALAMIN Take 1,000 mcg by mouth daily.   Vitamin D-3 125 MCG (5000 UT) Tabs Take 5,000 Units by mouth daily.       Diagnostic Studies: Xr Tibia/fibula Right  Result Date: 10/20/2018 2 view radiographs of the right tibia transtibial amputation shows destructive bony changes over the distal anterior cortex consistent with osteomyelitis.   Patient benefited maximally from their hospital stay and there were no complications.     Disposition: Discharge disposition: 03-Skilled Nursing Facility      Discharge Instructions    Call MD / Call 911   Complete by:  As directed    If you experience chest pain or shortness of breath, CALL 911 and be transported to the hospital emergency room.  If you develope a fever above 101 F, pus (white drainage) or increased drainage or redness at the wound, or calf pain, call your surgeon's office.   Care order/instruction:   Complete by:  As directed    Place Prevena VAC for discharge to SNF   Constipation Prevention   Complete by:  As directed    Drink plenty of fluids.  Prune juice may be helpful.  You may use a stool softener, such as Colace (over the counter) 100 mg twice a day.  Use MiraLax (over the counter) for constipation as needed.   Diet - low sodium heart healthy   Complete by:  As directed    Increase activity slowly as tolerated   Complete by:  As  directed      Follow-up Information    [name], MD In 1 week.   Specialty:  Orthopedic Surgery Contact information: [place] [address] [phone]          Patient was discharged with 7 day supply of controlled medications and will need update/refills per facility provider .   SignedErlinda Hong, [name] 10/24/2018, 9:06 AM [employer] [phone]

## 2018-10-28 ENCOUNTER — Other Ambulatory Visit: Payer: Self-pay

## 2018-10-28 ENCOUNTER — Ambulatory Visit (INDEPENDENT_AMBULATORY_CARE_PROVIDER_SITE_OTHER): Payer: Medicaid Other | Admitting: Physician Assistant

## 2018-10-28 ENCOUNTER — Encounter: Payer: Self-pay | Admitting: Orthopedic Surgery

## 2018-10-28 VITALS — Ht 72.0 in | Wt 247.0 lb

## 2018-10-28 DIAGNOSIS — Z89422 Acquired absence of other left toe(s): Secondary | ICD-10-CM

## 2018-10-28 DIAGNOSIS — E1142 Type 2 diabetes mellitus with diabetic polyneuropathy: Secondary | ICD-10-CM

## 2018-10-28 DIAGNOSIS — Z89511 Acquired absence of right leg below knee: Secondary | ICD-10-CM

## 2018-10-28 DIAGNOSIS — I110 Hypertensive heart disease with heart failure: Secondary | ICD-10-CM

## 2018-10-28 DIAGNOSIS — S98132A Complete traumatic amputation of one left lesser toe, initial encounter: Secondary | ICD-10-CM

## 2018-10-28 DIAGNOSIS — Z794 Long term (current) use of insulin: Secondary | ICD-10-CM

## 2018-10-28 DIAGNOSIS — I5032 Chronic diastolic (congestive) heart failure: Secondary | ICD-10-CM

## 2018-10-29 ENCOUNTER — Encounter: Payer: Self-pay | Admitting: Orthopedic Surgery

## 2018-10-29 NOTE — Progress Notes (Signed)
Office Visit Note   Patient: Gregory [name]           Date of Birth: [birth year]           MRN: [id number] Visit Date: 10/28/2018              Requested by: [name], MD [place], [place] [address] PCP: [name], MD  Chief Complaint  Patient presents with   Right Leg - Routine Post Op    10/22/2018 revision right AKA  D/c vac today it was alarming 50 cc No shrinker       HPI: The patient is a 58 year old gentleman who is seen for postoperative follow-up following right transtibial amputation revision on 10/22/2018.  He presents with his Gregory [name] wound VAC which is alarming today.  This was removed.  There is approximately 50 cc of drainage in the canister.  He currently is at [facility] facility.  He remains on doxycycline, I do not see the results of any cultures from the OR. He reports his pain has been severe at times over the right lower extremity.  He reports worsening of his pain with elevation.  Assessment & Plan: Visit Diagnoses:  1. S/P below knee amputation, right ([place])   2. Amputated toe of left foot ([place])   3. Type 2 diabetes mellitus with diabetic polyneuropathy, with long-term current use of insulin ([place])   4. Hypertensive heart disease with chronic diastolic congestive heart failure ([place])     Plan: Orders were sent with the patient to the skilled nursing facility to wash the right transtibial amputation site and left foot daily with Dial soap and water.  They should apply Silvadene cream to the left foot open area daily and cover with gauze and an Ace wrap.  He should elevate as much as he can tolerate.  They should apply dry gauze and Ace wrapping to the right transtibial amputation site and then use his stump shrinker stocking directly in contact with the skin once the stump shrinker stocking is obtained.  He was given a prescription for [facility] for stump shrinker stockings per his request.  He will follow-up in 1  week.  Follow-Up Instructions: Return in about 1 week (around 11/04/2018).   Ortho Exam  Patient is alert, oriented, no adenopathy, well-dressed, normal affect, normal respiratory effort. Sutures are in place over the right transtibial amputation site.  There is scant serous drainage.  There is edema but no erythema over the residual limb.  He lacks several degrees of full extension. Examination of the left foot amputation site remains slightly open with yellow fibrinous tissue present and minimal serous appearing drainage.  He does have marked woody edema edema of the left lower extremity which is nonpitting.  There are no signs of cellulitis currently.  He has weakly palpable pedal pulses.  Imaging: No results found.    Labs: Lab Results  Component Value Date   HGBA1C 8.0 (H) 06/04/2018   HGBA1C 7.9 (H) 04/25/2018   HGBA1C 7.2 (H) 03/25/2017   ESRSEDRATE 120 (H) 03/25/2017   ESRSEDRATE 84 (H) 10/02/2016   ESRSEDRATE 132 (H) 12/01/2015   CRP 4.9 (H) 03/25/2017   CRP 30.6 (H) 10/02/2016   CRP 13.7 (H) 11/11/2015   REPTSTATUS 06/11/2018 FINAL 06/06/2018   GRAMSTAIN  06/06/2018    RARE WBC PRESENT, PREDOMINANTLY PMN FEW GRAM POSITIVE COCCI    CULT  06/06/2018    MODERATE STREPTOCOCCUS CANIS NO ANAEROBES ISOLATED Performed  at [facility], [place] [address] [place], [place] [address]    LABORGA PROVIDENCIA [name] (A) 01/12/2017     Lab Results  Component Value Date   ALBUMIN 3.3 (L) 10/22/2018   ALBUMIN 2.2 (L) 06/04/2018   ALBUMIN 2.8 (L) 06/03/2018   PREALBUMIN 23.3 03/25/2017    Body mass index is 33.5 kg/m.  Orders:  No orders of the defined types were placed in this encounter.  No orders of the defined types were placed in this encounter.    Procedures: No procedures performed  Clinical Data: No additional findings.  ROS:  All other systems negative, except as noted in the HPI. Review of Systems  Objective: Vital Signs: Ht 6' (1.829 m)     Wt 247 lb (112 kg)    BMI 33.50 kg/m   Specialty Comments:  No specialty comments available.  PMFS History: Patient Active Problem List   Diagnosis Date Noted   Below-knee amputation of right lower extremity ([place]) 10/22/2018   Dehiscence of amputation stump ([place])    Amputation stump infection ([place]) 08/12/2018   Candidiasis 08/12/2018   Necrosis of toe ([place]) 07/08/2018   Amputated toe of left foot ([place]) 06/18/2018   Streptococcal infection    Pressure injury of skin 06/06/2018   Cutaneous abscess of left foot    Severe protein-calorie malnutrition (HCC)    Cellulitis of left leg    Septic arthritis ([place]) 06/03/2018   Idiopathic chronic venous hypertension of left lower extremity with ulcer and inflammation ([place]) 11/14/2017   Great toe amputation status, left 11/14/2017   Acquired absence of right leg below knee ([place]) 06/20/2017   Osteomyelitis ([place]) 03/25/2017   Skin cancer 03/25/2017   Depression, recurrent ([place]) 11/05/2016   Diabetic ulcer of toe of left foot ([place]) 10/02/2016   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   Chronic pain 09/27/2016   Dyslipidemia associated with type 2 diabetes mellitus ([place]) 06/06/2016   GERD without esophagitis 05/11/2016   Hypertensive heart disease with congestive heart failure ([place]) 12/15/2015   Spinal stenosis in cervical region 12/15/2015   Type II diabetes mellitus with neurological manifestations ([place]) 12/15/2015   Chronic diastolic CHF (congestive heart failure) ([place])    Pulmonary hypertension (HCC)    Urinary retention    Diskitis    Foot drop, bilateral    Sepsis with acute renal failure without septic shock ([place])    MRSA bacteremia 10/03/2015   COPD (chronic obstructive pulmonary disease) ([place]) 10/03/2015   Acute osteomyelitis of left foot ([place]) 10/03/2015   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)    Acquired absence of right leg below knee ([place])    Acute  osteomyelitis of calcaneum, right ([place]) 10/02/2016   Acute respiratory failure (HCC)    Amputation stump infection ([place]) 08/12/2018   Anemia    Basal cell carcinoma, eyelid    Cancer (HCC)    Candidiasis 08/12/2018   CHF (congestive heart failure) (HCC)    chronic diastolic    Chronic back pain    Chronic kidney disease    stage 3    Chronic multifocal osteomyelitis (HCC)    of ankle and foot    Chronic pain syndrome    COPD (chronic obstructive pulmonary disease) (HCC)    DDD (degenerative disc disease), lumbar    Depression    major depressive disorder    Diabetes mellitus without complication ([place])    type 2    Diabetic ulcer of toe  of left foot ([place]) 10/02/2016   Difficulty in walking, not elsewhere classified    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   Foot drop, bilateral    Foot drop, right 12/27/2015   GERD (gastroesophageal reflux disease)    Herpesviral vesicular dermatitis    Hyperlipidemia    Hyperlipidemia    Hypertension    Insomnia    Malignant melanoma of other parts of face ([place])    MRSA bacteremia    Muscle weakness (generalized)    Nasal congestion    Necrosis of toe ([place]) 07/08/2018   Neuromuscular dysfunction of bladder    Osteomyelitis (HCC)    Osteomyelitis (HCC)    Osteomyelitis (HCC)    right BKA   Other idiopathic peripheral autonomic neuropathy    Retention of urine, unspecified    Squamous cell carcinoma of skin    Unsteadiness on feet    Urinary retention    Urinary retention    Urinary tract infection    Wears dentures    Wears glasses     Family History  Problem Relation Age of Onset   Cancer Other    Diabetes Other     Past Surgical History:  Procedure Laterality Date   AMPUTATION Left 03/29/2017   Procedure: LEFT GREAT TOE AMPUTATION AT METATARSOPHALANGEAL JOINT;  Surgeon: [name], MD;  Location: [facility];  Service: Orthopedics;  Laterality: Left;   AMPUTATION Right 03/29/2017   Procedure:  RIGHT BELOW KNEE AMPUTATION;  Surgeon: [name], MD;  Location: [facility];  Service: Orthopedics;  Laterality: Right;   AMPUTATION Left 06/06/2018   Procedure: LEFT 2ND TOE AMPUTATION;  Surgeon: [name], MD;  Location: [facility];  Service: Orthopedics;  Laterality: Left;   ANTERIOR CERVICAL CORPECTOMY N/A 11/25/2015   Procedure: ANTERIOR CERVICAL FIVE CORPECTOMY Cervical four - six fusion;  Surgeon: Consuella Lose, MD;  Location: [facility];  Service: Neurosurgery;  Laterality: N/A;  ANTERIOR CERVICAL FIVE CORPECTOMY Cervical four - six fusion   APPLICATION OF WOUND VAC Right 10/22/2018   Procedure: Application Of Wound Vac;  Surgeon: [name], MD;  Location: [facility];  Service: Orthopedics;  Laterality: Right;   CYSTOSCOPY WITH RETROGRADE URETHROGRAM N/A 04/25/2018   Procedure: CYSTOSCOPY WITH RETROGRADE URETHROGRAM/ BALLOON DILATION;  Surgeon: Kathie Rhodes, MD;  Location[facility];  Service: Urology;  Laterality: N/A;   MULTIPLE TOOTH EXTRACTIONS     POSTERIOR CERVICAL FUSION/FORAMINOTOMY N/A 11/29/2015   Procedure: Cervical Three-Cervical Seven Posterior Cervical Laminectomy with Fusion;  Surgeon: Consuella Lose, MD;  Location: [facility];  Service: Neurosurgery;  Laterality: N/A;  Cervical Three-Cervical Seven Posterior Cervical Laminectomy with Fusion   STUMP REVISION Right 10/22/2018   Procedure: REVISION RIGHT BELOW KNEE AMPUTATION;  Surgeon: [name], MD;  Location: [facility];  Service: Orthopedics;  Laterality: Right;   Social History   Occupational History   Not on file  Tobacco Use   Smoking status: Current Every Day Smoker    Packs/day: 0.50    Types: Cigarettes   Smokeless tobacco: Never Used  Substance and Sexual Activity   Alcohol use: No   Drug use: No   Sexual activity: Not on file

## 2018-11-04 ENCOUNTER — Ambulatory Visit (INDEPENDENT_AMBULATORY_CARE_PROVIDER_SITE_OTHER): Payer: Medicaid Other | Admitting: Orthopedic Surgery

## 2018-11-04 ENCOUNTER — Other Ambulatory Visit: Payer: Self-pay

## 2018-11-04 ENCOUNTER — Encounter: Payer: Self-pay | Admitting: Orthopedic Surgery

## 2018-11-04 VITALS — Ht 72.0 in | Wt 247.0 lb

## 2018-11-04 DIAGNOSIS — Z89511 Acquired absence of right leg below knee: Secondary | ICD-10-CM

## 2018-11-04 NOTE — Progress Notes (Signed)
Office Visit Note   Patient: Gregory [name]           Date of Birth: 10-16-1960           MRN: [id number] Visit Date: 11/04/2018              Requested by: [name], MD [place], [place] [address] PCP: [name], MD  Chief Complaint  Patient presents with  . Right Leg - Routine Post Op    10/22/2018 right BKA revision       HPI: Patient is a 58 year old gentleman who presents status post revision right transtibial amputation approximately 2 weeks ago.  Patient states he fell on the residual limb at his skilled nursing facility 2 days ago.  Patient complains of pain dehiscence drainage and redness.   Assessment & Plan: Visit Diagnoses:  1. S/P below knee amputation, right ([place])     Plan: Recommended patient to proceed with a revision of the amputation.  Due to the extremely short length of the residual limb patient would require a above-the-knee amputation.  Patient states that he cannot proceed with an amputation revision at this time.  He understands the risks of sepsis progressive wound dehiscence and medical complications with delaying surgery.  Plan to follow-up in 1 week.  Follow-Up Instructions: Return in about 1 week (around 11/11/2018).   Ortho Exam  Patient is alert, oriented, no adenopathy, well-dressed, normal affect, normal respiratory effort. Examination patient has cellulitis wound dehiscence necrotic tissue with exposed bone secondary to his recent fall.  Patient is putting his finger directly in the wound to touch the bone.  There is necrotic tissue with around the wound edges.  Patient has insufficient tibia remaining to revise the amputation to provide a stable residual limb for prosthetic fitting.  Patient will need to proceed with an above-the-knee amputation.  Imaging: No results found. No images are attached to the encounter.  Labs: Lab Results  Component Value Date   HGBA1C 8.0 (H) 06/04/2018   HGBA1C 7.9 (H) 04/25/2018   HGBA1C 7.2 (H) 03/25/2017   ESRSEDRATE 120 (H) 03/25/2017   ESRSEDRATE 84 (H) 10/02/2016   ESRSEDRATE 132 (H) 12/01/2015   CRP 4.9 (H) 03/25/2017   CRP 30.6 (H) 10/02/2016   CRP 13.7 (H) 11/11/2015   REPTSTATUS 06/11/2018 FINAL 06/06/2018   GRAMSTAIN  06/06/2018    RARE WBC PRESENT, PREDOMINANTLY PMN FEW GRAM POSITIVE COCCI    CULT  06/06/2018    MODERATE STREPTOCOCCUS CANIS NO ANAEROBES ISOLATED Performed at [facility], [place] [address] [place], [place] [address]    LABORGA PROVIDENCIA [name] (A) 01/12/2017     Lab Results  Component Value Date   ALBUMIN 3.3 (L) 10/22/2018   ALBUMIN 2.2 (L) 06/04/2018   ALBUMIN 2.8 (L) 06/03/2018   PREALBUMIN 23.3 03/25/2017    Body mass index is 33.5 kg/m.  Orders:  No orders of the defined types were placed in this encounter.  No orders of the defined types were placed in this encounter.    Procedures: No procedures performed  Clinical Data: No additional findings.  ROS:  All other systems negative, except as noted in the HPI. Review of Systems  Objective: Vital Signs: Ht 6' (1.829 m)   Wt 247 lb (112 kg)   BMI 33.50 kg/m   Specialty Comments:  No specialty comments available.  PMFS History: Patient Active Problem List   Diagnosis Date Noted  . Below-knee amputation of right lower extremity (  [place]) 10/22/2018  . Dehiscence of amputation stump ([place])   . Amputation stump infection ([place]) 08/12/2018  . Candidiasis 08/12/2018  . Necrosis of toe ([place]) 07/08/2018  . Amputated toe of left foot ([place]) 06/18/2018  . Streptococcal infection   . Pressure injury of skin 06/06/2018  . Cutaneous abscess of left foot   . Severe protein-calorie malnutrition ([place])   . Cellulitis of left leg   . Septic arthritis ([place]) 06/03/2018  . Idiopathic chronic venous hypertension of left lower extremity with ulcer and inflammation ([place]) 11/14/2017  . Great toe amputation status, left 11/14/2017  . Acquired absence of right leg  below knee ([place]) 06/20/2017  . Osteomyelitis ([place]) 03/25/2017  . Skin cancer 03/25/2017  . Depression, recurrent ([place]) 11/05/2016  . Diabetic ulcer of toe of left foot ([place]) 10/02/2016  . 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 Chronic pain 09/27/2016  . Dyslipidemia associated with type 2 diabetes mellitus ([place]) 06/06/2016  . GERD without esophagitis 05/11/2016  . Hypertensive heart disease with congestive heart failure ([place]) 12/15/2015  . Spinal stenosis in cervical region 12/15/2015  . Type II diabetes mellitus with neurological manifestations ([place]) 12/15/2015  . Chronic diastolic CHF (congestive heart failure) ([place])   . Pulmonary hypertension ([place])   . Urinary retention   . Diskitis   . Foot drop, bilateral   . Sepsis with acute renal failure without septic shock ([place])   . MRSA bacteremia 10/03/2015  . COPD (chronic obstructive pulmonary disease) ([place]) 10/03/2015  . Acute osteomyelitis of left foot ([place]) 10/03/2015   Past Medical History:  Diagnosis Date  . Acquired absence of left great toe ([place])   . Acquired absence of right leg below knee ([place])   . Acute osteomyelitis of calcaneum, right ([place]) 10/02/2016  . Acute respiratory failure ([place])   . Amputation stump infection ([place]) 08/12/2018  . Anemia   . Basal cell carcinoma, eyelid   . Cancer ([place])   . Candidiasis 08/12/2018  . CHF (congestive heart failure) (HCC)    chronic diastolic   . Chronic back pain   . Chronic kidney disease    stage 3   . Chronic multifocal osteomyelitis (HCC)    of ankle and foot   . Chronic pain syndrome   . COPD (chronic obstructive pulmonary disease) ([place])   . DDD (degenerative disc disease), lumbar   . Depression    major depressive disorder   . Diabetes mellitus without complication ([place])    type 2   . Diabetic ulcer of toe of left foot ([place]) 10/02/2016  . Difficulty in walking, not elsewhere classified   . 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 Foot drop, bilateral   . Foot drop, right 12/27/2015  .  GERD (gastroesophageal reflux disease)   . Herpesviral vesicular dermatitis   . Hyperlipidemia   . Hyperlipidemia   . Hypertension   . Insomnia   . Malignant melanoma of other parts of face ([place])   . MRSA bacteremia   . Muscle weakness (generalized)   . Nasal congestion   . Necrosis of toe ([place]) 07/08/2018  . Neuromuscular dysfunction of bladder   . Osteomyelitis ([place])   . Osteomyelitis ([place])   . Osteomyelitis ([place])    right BKA  . Other idiopathic peripheral autonomic neuropathy   . Retention of urine, unspecified   . Squamous cell carcinoma of skin   . Unsteadiness on feet   . Urinary retention   . Urinary retention   . Urinary tract infection   . Wears  dentures   . Wears glasses     Family History  Problem Relation Age of Onset  . Cancer Other   . Diabetes Other     Past Surgical History:  Procedure Laterality Date  . AMPUTATION Left 03/29/2017   Procedure: LEFT GREAT TOE AMPUTATION AT METATARSOPHALANGEAL JOINT;  Surgeon: [name], MD;  Location: [facility];  Service: Orthopedics;  Laterality: Left;  . AMPUTATION Right 03/29/2017   Procedure: RIGHT BELOW KNEE AMPUTATION;  Surgeon: [name], MD;  Location: [facility];  Service: Orthopedics;  Laterality: Right;  . AMPUTATION Left 06/06/2018   Procedure: LEFT 2ND TOE AMPUTATION;  Surgeon: [name], MD;  Location: [facility];  Service: Orthopedics;  Laterality: Left;  . ANTERIOR CERVICAL CORPECTOMY N/A 11/25/2015   Procedure: ANTERIOR CERVICAL FIVE CORPECTOMY Cervical four - six fusion;  Surgeon: Consuella Lose, MD;  Location: [facility];  Service: Neurosurgery;  Laterality: N/A;  ANTERIOR CERVICAL FIVE CORPECTOMY Cervical four - six fusion  . APPLICATION OF WOUND VAC Right 10/22/2018   Procedure: Application Of Wound Vac;  Surgeon: [name], MD;  Location: [facility];  Service: Orthopedics;  Laterality: Right;  . CYSTOSCOPY WITH RETROGRADE URETHROGRAM N/A 04/25/2018   Procedure: CYSTOSCOPY WITH RETROGRADE URETHROGRAM/  BALLOON DILATION;  Surgeon: Kathie Rhodes, MD;  [facility];  Service: Urology;  Laterality: N/A;  . MULTIPLE TOOTH EXTRACTIONS    . POSTERIOR CERVICAL FUSION/FORAMINOTOMY N/A 11/29/2015   Procedure: Cervical Three-Cervical Seven Posterior Cervical Laminectomy with Fusion;  Surgeon: Consuella Lose, MD;  Location: [facility];  Service: Neurosurgery;  Laterality: N/A;  Cervical Three-Cervical Seven Posterior Cervical Laminectomy with Fusion  . STUMP REVISION Right 10/22/2018   Procedure: REVISION RIGHT BELOW KNEE AMPUTATION;  Surgeon: [name], MD;  Location: [facility];  Service: Orthopedics;  Laterality: Right;   Social History   Occupational History  . Not on file  Tobacco Use  . Smoking status: Current Every Day Smoker    Packs/day: 0.50    Types: Cigarettes  . Smokeless tobacco: Never Used  Substance and Sexual Activity  . Alcohol use: No  . Drug use: No  . Sexual activity: Not on file

## 2018-11-11 ENCOUNTER — Encounter: Payer: Self-pay | Admitting: Orthopedic Surgery

## 2018-11-11 ENCOUNTER — Other Ambulatory Visit: Payer: Self-pay

## 2018-11-11 ENCOUNTER — Ambulatory Visit (INDEPENDENT_AMBULATORY_CARE_PROVIDER_SITE_OTHER): Payer: Medicaid Other | Admitting: Orthopedic Surgery

## 2018-11-11 VITALS — Ht 72.0 in | Wt 247.0 lb

## 2018-11-11 DIAGNOSIS — Z89511 Acquired absence of right leg below knee: Secondary | ICD-10-CM

## 2018-11-11 DIAGNOSIS — T8781 Dehiscence of amputation stump: Secondary | ICD-10-CM

## 2018-11-11 MED ORDER — LEVOFLOXACIN 750 MG PO TABS: 750.0000 mg | ORAL_TABLET | Freq: Every day | ORAL | 0 refills | Status: DC

## 2018-11-11 NOTE — Progress Notes (Signed)
Office Visit Note   Patient: Gregory [name]           Date of Birth: 1961/05/07           MRN: [id number] Visit Date: 11/11/2018              Requested by: [name], MD [place], [place] [address] PCP: [name], MD  Chief Complaint  Patient presents with  . Right Leg - Routine Post Op    10/22/18 right BKA revision      HPI: Patient is a 58 year old gentleman is status post right below the knee amputation.  Patient has had wound dehiscence with cellulitis.  He has just completed his course of antibiotics.  Patient states he is not ready to proceed with surgery he states there is increased drainage redness and swelling.  Assessment & Plan: Visit Diagnoses:  1. S/P below knee amputation, right ([place])   2. Dehiscence of amputation stump ([place])     Plan: Patient requested a prescription for Levaquin treatment by him sometime until he is ready to make a decision.  We will have his leg washed with soap and water daily apply antibiotic ointment harvest the sutures today prescription for Levaquin 750 mg.  Follow-Up Instructions: Return in about 1 week (around 11/18/2018).   Ortho Exam  Patient is alert, oriented, no adenopathy, well-dressed, normal affect, normal respiratory effort. Examination patient has swelling cellulitis and drainage of the right transtibial dictation with wound dehiscence the open area is 2 cm in diameter and probes down to bone.  The fibrinous exudative tissue was debrided and there is some healthy granulation tissue at the base.  We will harvest the sutures today.  Imaging: No results found. No images are attached to the encounter.  Labs: Lab Results  Component Value Date   HGBA1C 8.0 (H) 06/04/2018   HGBA1C 7.9 (H) 04/25/2018   HGBA1C 7.2 (H) 03/25/2017   ESRSEDRATE 120 (H) 03/25/2017   ESRSEDRATE 84 (H) 10/02/2016   ESRSEDRATE 132 (H) 12/01/2015   CRP 4.9 (H) 03/25/2017   CRP 30.6 (H) 10/02/2016   CRP 13.7 (H) 11/11/2015    REPTSTATUS 06/11/2018 FINAL 06/06/2018   GRAMSTAIN  06/06/2018    RARE WBC PRESENT, PREDOMINANTLY PMN FEW GRAM POSITIVE COCCI    CULT  06/06/2018    MODERATE STREPTOCOCCUS CANIS NO ANAEROBES ISOLATED Performed at [facility], [place] [address], [place], [place] [address]    LABORGA PROVIDENCIA [name] (A) 01/12/2017     Lab Results  Component Value Date   ALBUMIN 3.3 (L) 10/22/2018   ALBUMIN 2.2 (L) 06/04/2018   ALBUMIN 2.8 (L) 06/03/2018   PREALBUMIN 23.3 03/25/2017    Body mass index is 33.5 kg/m.  Orders:  No orders of the defined types were placed in this encounter.  Meds ordered this encounter  Medications  . levofloxacin (LEVAQUIN) 750 MG tablet    Sig: Take 1 tablet (750 mg total) by mouth daily.    Dispense:  30 tablet    Refill:  0     Procedures: No procedures performed  Clinical Data: No additional findings.  ROS:  All other systems negative, except as noted in the HPI. Review of Systems  Objective: Vital Signs: Ht 6' (1.829 m)   Wt 247 lb (112 kg)   BMI 33.50 kg/m   Specialty Comments:  No specialty comments available.  PMFS History: Patient Active Problem List   Diagnosis Date Noted  . Below-knee amputation  of right lower extremity ([place]) 10/22/2018  . Dehiscence of amputation stump ([place])   . Amputation stump infection ([place]) 08/12/2018  . Candidiasis 08/12/2018  . Necrosis of toe ([place]) 07/08/2018  . Amputated toe of left foot ([place]) 06/18/2018  . Streptococcal infection   . Pressure injury of skin 06/06/2018  . Cutaneous abscess of left foot   . Severe protein-calorie malnutrition ([place])   . Cellulitis of left leg   . Septic arthritis ([place]) 06/03/2018  . Idiopathic chronic venous hypertension of left lower extremity with ulcer and inflammation ([place]) 11/14/2017  . Great toe amputation status, left 11/14/2017  . Acquired absence of right leg below knee ([place]) 06/20/2017  . Osteomyelitis ([place]) 03/25/2017  . Skin cancer  03/25/2017  . Depression, recurrent ([place]) 11/05/2016  . Diabetic ulcer of toe of left foot ([place]) 10/02/2016  . 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 Chronic pain 09/27/2016  . Dyslipidemia associated with type 2 diabetes mellitus ([place]) 06/06/2016  . GERD without esophagitis 05/11/2016  . Hypertensive heart disease with congestive heart failure ([place]) 12/15/2015  . Spinal stenosis in cervical region 12/15/2015  . Type II diabetes mellitus with neurological manifestations ([place]) 12/15/2015  . Chronic diastolic CHF (congestive heart failure) ([place])   . Pulmonary hypertension ([place])   . Urinary retention   . Diskitis   . Foot drop, bilateral   . Sepsis with acute renal failure without septic shock ([place])   . MRSA bacteremia 10/03/2015  . COPD (chronic obstructive pulmonary disease) ([place]) 10/03/2015  . Acute osteomyelitis of left foot ([place]) 10/03/2015   Past Medical History:  Diagnosis Date  . Acquired absence of left great toe ([place])   . Acquired absence of right leg below knee ([place])   . Acute osteomyelitis of calcaneum, right ([place]) 10/02/2016  . Acute respiratory failure ([place])   . Amputation stump infection ([place]) 08/12/2018  . Anemia   . Basal cell carcinoma, eyelid   . Cancer ([place])   . Candidiasis 08/12/2018  . CHF (congestive heart failure) (H[facility])    chronic diastolic   . Chronic back pain   . Chronic kidney disease    stage 3   . Chronic multifocal osteomyelitis ([facility])    of ankle and foot   . Chronic pain syndrome   . COPD (chronic obstructive pulmonary disease) ([place])   . DDD (degenerative disc disease), lumbar   . Depression    major depressive disorder   . Diabetes mellitus without complication ([place])    type 2   . Diabetic ulcer of toe of left foot ([place]) 10/02/2016  . Difficulty in walking, not elsewhere classified   . 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 Foot drop, bilateral   . Foot drop, right 12/27/2015  . GERD (gastroesophageal reflux disease)   . Herpesviral vesicular dermatitis   .  Hyperlipidemia   . Hyperlipidemia   . Hypertension   . Insomnia   . Malignant melanoma of other parts of face ([place])   . MRSA bacteremia   . Muscle weakness (generalized)   . Nasal congestion   . Necrosis of toe ([place]) 07/08/2018  . Neuromuscular dysfunction of bladder   . Osteomyelitis ([place])   . Osteomyelitis ([place])   . Osteomyelitis ([place])    right BKA  . Other idiopathic peripheral autonomic neuropathy   . Retention of urine, unspecified   . Squamous cell carcinoma of skin   . Unsteadiness on feet   . Urinary retention   . Urinary retention   . Urinary tract infection   .  Wears dentures   . Wears glasses     Family History  Problem Relation Age of Onset  . Cancer Other   . Diabetes Other     Past Surgical History:  Procedure Laterality Date  . AMPUTATION Left 03/29/2017   Procedure: LEFT GREAT TOE AMPUTATION AT METATARSOPHALANGEAL JOINT;  Surgeon: [name], MD;  Location: [facility];  Service: Orthopedics;  Laterality: Left;  . AMPUTATION Right 03/29/2017   Procedure: RIGHT BELOW KNEE AMPUTATION;  Surgeon: [name], MD;  Location: [facility];  Service: Orthopedics;  Laterality: Right;  . AMPUTATION Left 06/06/2018   Procedure: LEFT 2ND TOE AMPUTATION;  Surgeon: [name], MD;  Location: [facility];  Service: Orthopedics;  Laterality: Left;  . ANTERIOR CERVICAL CORPECTOMY N/A 11/25/2015   Procedure: ANTERIOR CERVICAL FIVE CORPECTOMY Cervical four - six fusion;  Surgeon: Consuella Lose, MD;  Location: [facility];  Service: Neurosurgery;  Laterality: N/A;  ANTERIOR CERVICAL FIVE CORPECTOMY Cervical four - six fusion  . APPLICATION OF WOUND VAC Right 10/22/2018   Procedure: Application Of Wound Vac;  Surgeon: [name], MD;  Location: [facility];  Service: Orthopedics;  Laterality: Right;  . CYSTOSCOPY WITH RETROGRADE URETHROGRAM N/A 04/25/2018   Procedure: CYSTOSCOPY WITH RETROGRADE URETHROGRAM/ BALLOON DILATION;  Surgeon: Kathie Rhodes, MD;  Location: [facility];  Service:  Urology;  Laterality: N/A;  . MULTIPLE TOOTH EXTRACTIONS    . POSTERIOR CERVICAL FUSION/FORAMINOTOMY N/A 11/29/2015   Procedure: Cervical Three-Cervical Seven Posterior Cervical Laminectomy with Fusion;  Surgeon: Consuella Lose, MD;  Location: [facility];  Service: Neurosurgery;  Laterality: N/A;  Cervical Three-Cervical Seven Posterior Cervical Laminectomy with Fusion  . STUMP REVISION Right 10/22/2018   Procedure: REVISION RIGHT BELOW KNEE AMPUTATION;  Surgeon: [name], MD;  Location: [facility];  Service: Orthopedics;  Laterality: Right;   Social History   Occupational History  . Not on file  Tobacco Use  . Smoking status: Current Every Day Smoker    Packs/day: 0.50    Types: Cigarettes  . Smokeless tobacco: Never Used  Substance and Sexual Activity  . Alcohol use: No  . Drug use: No  . Sexual activity: Not on file

## 2018-11-15 ENCOUNTER — Telehealth: Payer: Self-pay | Admitting: Cardiology

## 2018-11-15 NOTE — Telephone Encounter (Signed)
Pt. Returned call.  Advised of possible exposure to COVID 19, while at his [facility] office appt. On 11/11/18.  The pt. stated he is in [place]. and would like me to call back on Monday, to discuss this with the Site Administrator.  Requested to call back to discuss with "Loie", site Administrator.

## 2018-11-15 NOTE — Telephone Encounter (Signed)
Left message for patient to callback [phone].  Call is regarding recent office appt 5/26 and possible exposure to Covid and offer testing.

## 2018-11-16 ENCOUNTER — Inpatient Hospital Stay (HOSPITAL_COMMUNITY)
Admission: EM | Admit: 2018-11-16 | Discharge: 2018-11-22 | DRG: 475 | Disposition: A | Discharge status: Skilled Nursing Facility | Payer: Medicaid Other | Source: Skilled Nursing Facility | Attending: Family Medicine | Admitting: Family Medicine

## 2018-11-16 ENCOUNTER — Encounter (HOSPITAL_COMMUNITY): Payer: Self-pay | Admitting: Emergency Medicine

## 2018-11-16 ENCOUNTER — Emergency Department (HOSPITAL_COMMUNITY): Payer: Medicaid Other

## 2018-11-16 ENCOUNTER — Other Ambulatory Visit: Payer: Self-pay

## 2018-11-16 DIAGNOSIS — Y835 Amputation of limb(s) as the cause of abnormal reaction of the patient, or of later complication, without mention of misadventure at the time of the procedure: Secondary | ICD-10-CM | POA: Diagnosis present

## 2018-11-16 DIAGNOSIS — I872 Venous insufficiency (chronic) (peripheral): Secondary | ICD-10-CM | POA: Diagnosis present

## 2018-11-16 DIAGNOSIS — R339 Retention of urine, unspecified: Secondary | ICD-10-CM | POA: Diagnosis not present

## 2018-11-16 DIAGNOSIS — F17210 Nicotine dependence, cigarettes, uncomplicated: Secondary | ICD-10-CM | POA: Diagnosis present

## 2018-11-16 DIAGNOSIS — W19XXXA Unspecified fall, initial encounter: Secondary | ICD-10-CM | POA: Diagnosis present

## 2018-11-16 DIAGNOSIS — T8781 Dehiscence of amputation stump: Secondary | ICD-10-CM | POA: Diagnosis present

## 2018-11-16 DIAGNOSIS — I890 Lymphedema, not elsewhere classified: Secondary | ICD-10-CM | POA: Diagnosis present

## 2018-11-16 DIAGNOSIS — E440 Moderate protein-calorie malnutrition: Secondary | ICD-10-CM | POA: Diagnosis not present

## 2018-11-16 DIAGNOSIS — R609 Edema, unspecified: Secondary | ICD-10-CM | POA: Diagnosis not present

## 2018-11-16 DIAGNOSIS — E785 Hyperlipidemia, unspecified: Secondary | ICD-10-CM | POA: Diagnosis present

## 2018-11-16 DIAGNOSIS — M7989 Other specified soft tissue disorders: Secondary | ICD-10-CM | POA: Diagnosis not present

## 2018-11-16 DIAGNOSIS — N179 Acute kidney failure, unspecified: Secondary | ICD-10-CM | POA: Diagnosis present

## 2018-11-16 DIAGNOSIS — J449 Chronic obstructive pulmonary disease, unspecified: Secondary | ICD-10-CM | POA: Diagnosis present

## 2018-11-16 DIAGNOSIS — E1142 Type 2 diabetes mellitus with diabetic polyneuropathy: Secondary | ICD-10-CM | POA: Diagnosis present

## 2018-11-16 DIAGNOSIS — L089 Local infection of the skin and subcutaneous tissue, unspecified: Secondary | ICD-10-CM | POA: Diagnosis present

## 2018-11-16 DIAGNOSIS — N183 Chronic kidney disease, stage 3 (moderate): Secondary | ICD-10-CM | POA: Diagnosis present

## 2018-11-16 DIAGNOSIS — D509 Iron deficiency anemia, unspecified: Secondary | ICD-10-CM | POA: Diagnosis present

## 2018-11-16 DIAGNOSIS — E1122 Type 2 diabetes mellitus with diabetic chronic kidney disease: Secondary | ICD-10-CM | POA: Diagnosis present

## 2018-11-16 DIAGNOSIS — M86451 Chronic osteomyelitis with draining sinus, right femur: Secondary | ICD-10-CM | POA: Diagnosis not present

## 2018-11-16 DIAGNOSIS — I130 Hypertensive heart and chronic kidney disease with heart failure and stage 1 through stage 4 chronic kidney disease, or unspecified chronic kidney disease: Secondary | ICD-10-CM | POA: Diagnosis present

## 2018-11-16 DIAGNOSIS — E1149 Type 2 diabetes mellitus with other diabetic neurological complication: Secondary | ICD-10-CM | POA: Diagnosis present

## 2018-11-16 DIAGNOSIS — I5032 Chronic diastolic (congestive) heart failure: Secondary | ICD-10-CM | POA: Diagnosis present

## 2018-11-16 DIAGNOSIS — T8740 Infection of amputation stump, unspecified extremity: Secondary | ICD-10-CM | POA: Insufficient documentation

## 2018-11-16 DIAGNOSIS — Y92129 Unspecified place in nursing home as the place of occurrence of the external cause: Secondary | ICD-10-CM | POA: Diagnosis not present

## 2018-11-16 DIAGNOSIS — E1151 Type 2 diabetes mellitus with diabetic peripheral angiopathy without gangrene: Secondary | ICD-10-CM | POA: Diagnosis present

## 2018-11-16 DIAGNOSIS — Z1159 Encounter for screening for other viral diseases: Secondary | ICD-10-CM

## 2018-11-16 DIAGNOSIS — H5462 Unqualified visual loss, left eye, normal vision right eye: Secondary | ICD-10-CM | POA: Diagnosis present

## 2018-11-16 DIAGNOSIS — F329 Major depressive disorder, single episode, unspecified: Secondary | ICD-10-CM | POA: Diagnosis present

## 2018-11-16 DIAGNOSIS — R229 Localized swelling, mass and lump, unspecified: Secondary | ICD-10-CM | POA: Diagnosis present

## 2018-11-16 DIAGNOSIS — L03115 Cellulitis of right lower limb: Secondary | ICD-10-CM | POA: Diagnosis present

## 2018-11-16 DIAGNOSIS — T8743 Infection of amputation stump, right lower extremity: Principal | ICD-10-CM | POA: Diagnosis present

## 2018-11-16 DIAGNOSIS — T148XXA Other injury of unspecified body region, initial encounter: Secondary | ICD-10-CM | POA: Diagnosis not present

## 2018-11-16 LAB — BASIC METABOLIC PANEL WITH GFR
Anion gap: 10 (ref 5–15)
BUN: 21 mg/dL — ABNORMAL HIGH (ref 6–20)
CO2: 27 mmol/L (ref 22–32)
Calcium: 9 mg/dL (ref 8.9–10.3)
Chloride: 99 mmol/L (ref 98–111)
Creatinine, Ser: 1.79 mg/dL — ABNORMAL HIGH (ref 0.61–1.24)
GFR calc Af Amer: 47 mL/min — ABNORMAL LOW
GFR calc non Af Amer: 41 mL/min — ABNORMAL LOW
Glucose, Bld: 107 mg/dL — ABNORMAL HIGH (ref 70–99)
Potassium: 4.4 mmol/L (ref 3.5–5.1)
Sodium: 136 mmol/L (ref 135–145)

## 2018-11-16 LAB — CBC WITH DIFFERENTIAL/PLATELET
Abs Immature Granulocytes: 0.04 10*3/uL (ref 0.00–0.07)
Basophils Absolute: 0.1 10*3/uL (ref 0.0–0.1)
Basophils Relative: 1 %
Eosinophils Absolute: 0.2 10*3/uL (ref 0.0–0.5)
Eosinophils Relative: 2 %
HCT: 31.3 % — ABNORMAL LOW (ref 39.0–52.0)
Hemoglobin: 9.3 g/dL — ABNORMAL LOW (ref 13.0–17.0)
Immature Granulocytes: 1 %
Lymphocytes Relative: 27 %
Lymphs Abs: 2.1 10*3/uL (ref 0.7–4.0)
MCH: 24.5 pg — ABNORMAL LOW (ref 26.0–34.0)
MCHC: 29.7 g/dL — ABNORMAL LOW (ref 30.0–36.0)
MCV: 82.4 fL (ref 80.0–100.0)
Monocytes Absolute: 0.5 10*3/uL (ref 0.1–1.0)
Monocytes Relative: 6 %
Neutro Abs: 5 10*3/uL (ref 1.7–7.7)
Neutrophils Relative %: 63 %
Platelets: 241 10*3/uL (ref 150–400)
RBC: 3.8 MIL/uL — ABNORMAL LOW (ref 4.22–5.81)
RDW: 16.9 % — ABNORMAL HIGH (ref 11.5–15.5)
WBC: 7.9 10*3/uL (ref 4.0–10.5)
nRBC: 0 % (ref 0.0–0.2)

## 2018-11-16 LAB — URINALYSIS, ROUTINE W REFLEX MICROSCOPIC
Bilirubin Urine: NEGATIVE
Glucose, UA: NEGATIVE mg/dL
Hgb urine dipstick: NEGATIVE
Ketones, ur: NEGATIVE mg/dL
Leukocytes,Ua: NEGATIVE
Nitrite: NEGATIVE
Protein, ur: NEGATIVE mg/dL
Specific Gravity, Urine: 1.006 (ref 1.005–1.030)
pH: 7 (ref 5.0–8.0)

## 2018-11-16 LAB — CBG MONITORING, ED
Glucose-Capillary: 80 mg/dL (ref 70–99)
Glucose-Capillary: 86 mg/dL (ref 70–99)

## 2018-11-16 LAB — GLUCOSE, CAPILLARY: Glucose-Capillary: 155 mg/dL — ABNORMAL HIGH (ref 70–99)

## 2018-11-16 LAB — SARS CORONAVIRUS 2 BY RT PCR (HOSPITAL ORDER, PERFORMED IN ~~LOC~~ HOSPITAL LAB): SARS Coronavirus 2: NEGATIVE

## 2018-11-16 MED ORDER — PIPERACILLIN-TAZOBACTAM 3.375 G IVPB 30 MIN
3.3750 g | Freq: Once | INTRAVENOUS | Status: AC
  Administered 2018-11-16: 3.375 g via INTRAVENOUS
  Filled 2018-11-16: qty 50

## 2018-11-16 MED ORDER — FUROSEMIDE 40 MG PO TABS
40.0000 mg | ORAL_TABLET | Freq: Every day | ORAL | Status: DC
  Administered 2018-11-17 – 2018-11-20 (×5): 40 mg via ORAL
  Filled 2018-11-16 (×3): qty 2
  Filled 2018-11-16: qty 1
  Filled 2018-11-16: qty 2

## 2018-11-16 MED ORDER — FINASTERIDE 5 MG PO TABS
5.0000 mg | ORAL_TABLET | Freq: Every day | ORAL | Status: DC
  Administered 2018-11-17 – 2018-11-22 (×5): 5 mg via ORAL
  Filled 2018-11-16 (×5): qty 1

## 2018-11-16 MED ORDER — ONDANSETRON HCL 4 MG/2ML IJ SOLN
4.0000 mg | Freq: Once | INTRAMUSCULAR | Status: AC
  Administered 2018-11-16: 4 mg via INTRAVENOUS
  Filled 2018-11-16: qty 2

## 2018-11-16 MED ORDER — MOMETASONE FURO-FORMOTEROL FUM 100-5 MCG/ACT IN AERO
2.0000 | INHALATION_SPRAY | Freq: Two times a day (BID) | RESPIRATORY_TRACT | Status: DC
  Administered 2018-11-17 – 2018-11-22 (×7): 2 via RESPIRATORY_TRACT
  Filled 2018-11-16: qty 8.8

## 2018-11-16 MED ORDER — INSULIN ASPART 100 UNIT/ML ~~LOC~~ SOLN
0.0000 [IU] | Freq: Three times a day (TID) | SUBCUTANEOUS | Status: DC
  Administered 2018-11-17: 3 [IU] via SUBCUTANEOUS
  Administered 2018-11-17 – 2018-11-18 (×2): 2 [IU] via SUBCUTANEOUS
  Administered 2018-11-18: 17:00:00 3 [IU] via SUBCUTANEOUS
  Administered 2018-11-18: 13:00:00 2 [IU] via SUBCUTANEOUS
  Administered 2018-11-19 (×2): 1 [IU] via SUBCUTANEOUS
  Administered 2018-11-20: 5 [IU] via SUBCUTANEOUS
  Administered 2018-11-20 (×2): 2 [IU] via SUBCUTANEOUS
  Administered 2018-11-21: 19:00:00 5 [IU] via SUBCUTANEOUS
  Administered 2018-11-21: 3 [IU] via SUBCUTANEOUS
  Administered 2018-11-21: 09:00:00 2 [IU] via SUBCUTANEOUS
  Administered 2018-11-22: 5 [IU] via SUBCUTANEOUS
  Administered 2018-11-22: 09:00:00 2 [IU] via SUBCUTANEOUS

## 2018-11-16 MED ORDER — NICOTINE 14 MG/24HR TD PT24
14.0000 mg | MEDICATED_PATCH | Freq: Every day | TRANSDERMAL | Status: DC
  Administered 2018-11-16 – 2018-11-22 (×7): 14 mg via TRANSDERMAL
  Filled 2018-11-16 (×7): qty 1

## 2018-11-16 MED ORDER — PIPERACILLIN-TAZOBACTAM 3.375 G IVPB
3.3750 g | Freq: Three times a day (TID) | INTRAVENOUS | Status: AC
  Administered 2018-11-16 – 2018-11-20 (×12): 3.375 g via INTRAVENOUS
  Filled 2018-11-16 (×10): qty 50

## 2018-11-16 MED ORDER — BUPROPION HCL ER (XL) 150 MG PO TB24
300.0000 mg | ORAL_TABLET | Freq: Every day | ORAL | Status: DC
  Administered 2018-11-16 – 2018-11-21 (×6): 300 mg via ORAL
  Filled 2018-11-16 (×6): qty 2

## 2018-11-16 MED ORDER — HYDROMORPHONE HCL 1 MG/ML IJ SOLN
1.0000 mg | Freq: Once | INTRAMUSCULAR | Status: AC
  Administered 2018-11-16: 1 mg via INTRAVENOUS
  Filled 2018-11-16: qty 1

## 2018-11-16 MED ORDER — PAROXETINE HCL 20 MG PO TABS
20.0000 mg | ORAL_TABLET | Freq: Every day | ORAL | Status: DC
  Administered 2018-11-16 – 2018-11-21 (×6): 20 mg via ORAL
  Filled 2018-11-16 (×6): qty 1

## 2018-11-16 MED ORDER — OXYCODONE HCL 5 MG PO TABS
10.0000 mg | ORAL_TABLET | Freq: Four times a day (QID) | ORAL | Status: DC | PRN
  Administered 2018-11-16: 10 mg via ORAL
  Filled 2018-11-16: qty 2

## 2018-11-16 MED ORDER — VANCOMYCIN HCL 10 G IV SOLR
2000.0000 mg | Freq: Once | INTRAVENOUS | Status: AC
  Administered 2018-11-16: 2000 mg via INTRAVENOUS
  Filled 2018-11-16: qty 2000

## 2018-11-16 MED ORDER — OXYCODONE-ACETAMINOPHEN 5-325 MG PO TABS
1.0000 | ORAL_TABLET | Freq: Once | ORAL | Status: DC
  Filled 2018-11-16: qty 1

## 2018-11-16 MED ORDER — PANTOPRAZOLE SODIUM 40 MG PO TBEC
40.0000 mg | DELAYED_RELEASE_TABLET | Freq: Every day | ORAL | Status: DC
  Administered 2018-11-17 – 2018-11-22 (×6): 40 mg via ORAL
  Filled 2018-11-16 (×6): qty 1

## 2018-11-16 MED ORDER — ATORVASTATIN CALCIUM 10 MG PO TABS
20.0000 mg | ORAL_TABLET | Freq: Every day | ORAL | Status: DC
  Administered 2018-11-17 – 2018-11-21 (×5): 20 mg via ORAL
  Filled 2018-11-16 (×5): qty 2

## 2018-11-16 MED ORDER — PREGABALIN 75 MG PO CAPS
75.0000 mg | ORAL_CAPSULE | Freq: Two times a day (BID) | ORAL | Status: DC
  Administered 2018-11-16 – 2018-11-17 (×2): 75 mg via ORAL
  Filled 2018-11-16 (×2): qty 1

## 2018-11-16 MED ORDER — VANCOMYCIN HCL 10 G IV SOLR: 1250.0000 mg | INTRAVENOUS | Status: DC

## 2018-11-16 MED ORDER — HEPARIN SODIUM (PORCINE) 5000 UNIT/ML IJ SOLN
5000.0000 [IU] | Freq: Three times a day (TID) | INTRAMUSCULAR | Status: AC
  Administered 2018-11-16: 5000 [IU] via SUBCUTANEOUS
  Filled 2018-11-16: qty 1

## 2018-11-16 MED ORDER — VANCOMYCIN HCL 10 G IV SOLR
1250.0000 mg | INTRAVENOUS | Status: AC
  Administered 2018-11-17 – 2018-11-20 (×4): 1250 mg via INTRAVENOUS
  Filled 2018-11-16 (×6): qty 1250

## 2018-11-16 MED ORDER — MORPHINE SULFATE (PF) 4 MG/ML IV SOLN
8.0000 mg | Freq: Once | INTRAVENOUS | Status: AC
  Administered 2018-11-16: 4 mg via INTRAVENOUS
  Filled 2018-11-16: qty 2

## 2018-11-16 MED ORDER — OXYCODONE HCL 5 MG PO TABS
20.0000 mg | ORAL_TABLET | Freq: Four times a day (QID) | ORAL | Status: DC | PRN
  Administered 2018-11-16 – 2018-11-17 (×3): 20 mg via ORAL
  Filled 2018-11-16 (×3): qty 4

## 2018-11-16 MED ORDER — AMLODIPINE BESYLATE 10 MG PO TABS
10.0000 mg | ORAL_TABLET | Freq: Every day | ORAL | Status: DC
  Administered 2018-11-17 – 2018-11-22 (×6): 10 mg via ORAL
  Filled 2018-11-16: qty 2
  Filled 2018-11-16: qty 1
  Filled 2018-11-16: qty 2
  Filled 2018-11-16: qty 1
  Filled 2018-11-16: qty 2
  Filled 2018-11-16: qty 1

## 2018-11-16 MED ORDER — METOPROLOL TARTRATE 25 MG PO TABS
25.0000 mg | ORAL_TABLET | Freq: Two times a day (BID) | ORAL | Status: DC
  Administered 2018-11-16 – 2018-11-22 (×12): 25 mg via ORAL
  Filled 2018-11-16 (×12): qty 1

## 2018-11-16 MED ORDER — BACLOFEN 10 MG PO TABS
5.0000 mg | ORAL_TABLET | Freq: Three times a day (TID) | ORAL | Status: DC | PRN
  Administered 2018-11-16 – 2018-11-22 (×10): 5 mg via ORAL
  Filled 2018-11-16 (×11): qty 1

## 2018-11-16 MED ORDER — ACETAMINOPHEN 325 MG PO TABS
650.0000 mg | ORAL_TABLET | Freq: Four times a day (QID) | ORAL | Status: DC
  Administered 2018-11-16 – 2018-11-22 (×21): 650 mg via ORAL
  Filled 2018-11-16 (×22): qty 2

## 2018-11-16 MED ORDER — OXYBUTYNIN CHLORIDE ER 15 MG PO TB24
15.0000 mg | ORAL_TABLET | Freq: Every day | ORAL | Status: DC
  Administered 2018-11-17 – 2018-11-21 (×5): 15 mg via ORAL
  Filled 2018-11-16 (×7): qty 1

## 2018-11-16 MED ORDER — MORPHINE SULFATE ER 15 MG PO TBCR
15.0000 mg | EXTENDED_RELEASE_TABLET | Freq: Two times a day (BID) | ORAL | Status: DC
  Administered 2018-11-16 – 2018-11-22 (×12): 15 mg via ORAL
  Filled 2018-11-16 (×12): qty 1

## 2018-11-16 MED ORDER — FINASTERIDE 5 MG PO TABS: 5.0000 mg | ORAL_TABLET | Freq: Every day | ORAL | Status: DC

## 2018-11-16 NOTE — ED Notes (Signed)
Bed re-assigned to 6N; floor will call. Not ready for report at this time.

## 2018-11-16 NOTE — ED Provider Notes (Signed)
[facility] EMERGENCY DEPARTMENT Provider Note   CSN: [id number] Arrival date & time: 11/16/18  1316    History   Chief Complaint Chief Complaint  Patient presents with  . Fall  . Leg Pain    HPI Gregory [name] is a 58 y.o. male.     HPI Patient is a 58 year old male with a history of recent right BKA stump revision for osteomyelitis.  This was performed by [name] at the beginning of May 2020.  He has since had recurrent stump infection with wound dehiscence and associated cellulitis.  He was seen in orthopedic office last week and surgical revision was recommended however at that time the patient was refusing.  He was given a prescription for Levaquin.  He presents the emergency department today for increasing right BKA pain and erythema and drainage despite appropriate antibiotics and pain medication.  No fevers.  No chills.  He now agrees to stump revision by [name].  He understands that operative management is his only option left given failed antibiotic treatment. Past Medical History:  Diagnosis Date  . Acquired absence of left great toe ([place])   . Acquired absence of right leg below knee ([place])   . Acute osteomyelitis of calcaneum, right ([place]) 10/02/2016  . Acute respiratory failure ([place])   . Amputation stump infection ([place]) 08/12/2018  . Anemia   . Basal cell carcinoma, eyelid   . Cancer ([place])   . Candidiasis 08/12/2018  . CHF (congestive heart failure) (HCC)    chronic diastolic   . Chronic back pain   . Chronic kidney disease    stage 3   . Chronic multifocal osteomyelitis (HCC)    of ankle and foot   . Chronic pain syndrome   . COPD (chronic obstructive pulmonary disease) ([place])   . DDD (degenerative disc disease), lumbar   . Depression    major depressive disorder   . Diabetes mellitus without complication ([place])    type 2   . Diabetic ulcer of toe of left foot ([place]) 10/02/2016  . Difficulty in walking, not elsewhere classified   . Epidural  abscess 10/02/2016  . Foot drop, bilateral   . Foot drop, right 12/27/2015  . GERD (gastroesophageal reflux disease)   . Herpesviral vesicular dermatitis   . Hyperlipidemia   . Hyperlipidemia   . Hypertension   . Insomnia   . Malignant melanoma of other parts of face ([place])   . MRSA bacteremia   . Muscle weakness (generalized)   . Nasal congestion   . Necrosis of toe ([place]) 07/08/2018  . Neuromuscular dysfunction of bladder   . Osteomyelitis ([place])   . Osteomyelitis ([place])   . Osteomyelitis ([place])    right BKA  . Other idiopathic peripheral autonomic neuropathy   . Retention of urine, unspecified   . Squamous cell carcinoma of skin   . Unsteadiness on feet   . Urinary retention   . Urinary retention   . Urinary tract infection   . Wears dentures   . Wears glasses     Patient Active Problem List   Diagnosis Date Noted  . Below-knee amputation of right lower extremity ([place]) 10/22/2018  . Dehiscence of amputation stump ([place])   . Amputation stump infection ([place]) 08/12/2018  . Candidiasis 08/12/2018  . Necrosis of toe ([place]) 07/08/2018  . Amputated toe of left foot ([place]) 06/18/2018  . Streptococcal infection   . Pressure injury of skin 06/06/2018  . Cutaneous abscess of left  foot   . Severe protein-calorie malnutrition ([place])   . Cellulitis of left leg   . Septic arthritis ([place]) 06/03/2018  . Idiopathic chronic venous hypertension of left lower extremity with ulcer and inflammation ([place]) 11/14/2017  . Great toe amputation status, left 11/14/2017  . Acquired absence of right leg below knee ([place]) 06/20/2017  . Osteomyelitis ([place]) 03/25/2017  . Skin cancer 03/25/2017  . Depression, recurrent ([place]) 11/05/2016  . Diabetic ulcer of toe of left foot ([place]) 10/02/2016  . 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 Chronic pain 09/27/2016  . Dyslipidemia associated with type 2 diabetes mellitus ([place]) 06/06/2016  . GERD without esophagitis 05/11/2016  . Hypertensive heart disease with congestive  heart failure ([place]) 12/15/2015  . Spinal stenosis in cervical region 12/15/2015  . Type II diabetes mellitus with neurological manifestations ([place]) 12/15/2015  . Chronic diastolic CHF (congestive heart failure) ([place])   . Pulmonary hypertension ([place])   . Urinary retention   . Diskitis   . Foot drop, bilateral   . Sepsis with acute renal failure without septic shock ([place])   . MRSA bacteremia 10/03/2015  . COPD (chronic obstructive pulmonary disease) ([place]) 10/03/2015  . Acute osteomyelitis of left foot ([place]) 10/03/2015    Past Surgical History:  Procedure Laterality Date  . AMPUTATION Left 03/29/2017   Procedure: LEFT GREAT TOE AMPUTATION AT METATARSOPHALANGEAL JOINT;  Surgeon: [name], MD;  Location: [facility];  Service: Orthopedics;  Laterality: Left;  . AMPUTATION Right 03/29/2017   Procedure: RIGHT BELOW KNEE AMPUTATION;  Surgeon: [name], MD;  Location: [facility];  Service: Orthopedics;  Laterality: Right;  . AMPUTATION Left 06/06/2018   Procedure: LEFT 2ND TOE AMPUTATION;  Surgeon: [name], MD;  Location: [facility];  Service: Orthopedics;  Laterality: Left;  . ANTERIOR CERVICAL CORPECTOMY N/A 11/25/2015   Procedure: ANTERIOR CERVICAL FIVE CORPECTOMY Cervical four - six fusion;  Surgeon: Consuella Lose, MD;  Location: [facility];  Service: Neurosurgery;  Laterality: N/A;  ANTERIOR CERVICAL FIVE CORPECTOMY Cervical four - six fusion  . APPLICATION OF WOUND VAC Right 10/22/2018   Procedure: Application Of Wound Vac;  Surgeon: [name], MD;  Location: [facility];  Service: Orthopedics;  Laterality: Right;  . CYSTOSCOPY WITH RETROGRADE URETHROGRAM N/A 04/25/2018   Procedure: CYSTOSCOPY WITH RETROGRADE URETHROGRAM/ BALLOON DILATION;  Surgeon: Kathie Rhodes, MD;  Location: [facility];  Service: Urology;  Laterality: N/A;  . MULTIPLE TOOTH EXTRACTIONS    . POSTERIOR CERVICAL FUSION/FORAMINOTOMY N/A 11/29/2015   Procedure: Cervical Three-Cervical Seven Posterior Cervical Laminectomy with  Fusion;  Surgeon: Consuella Lose, MD;  Location: [facility];  Service: Neurosurgery;  Laterality: N/A;  Cervical Three-Cervical Seven Posterior Cervical Laminectomy with Fusion  . STUMP REVISION Right 10/22/2018   Procedure: REVISION RIGHT BELOW KNEE AMPUTATION;  Surgeon: [name], MD;  Location: [facility];  Service: Orthopedics;  Laterality: Right;        Home Medications    Prior to Admission medications   Medication Sig Start Date End Date Taking? Authorizing Provider  acetaminophen (TYLENOL) 325 MG tablet Take 650 mg by mouth every 4 (four) hours as needed (general discomfort).     [provider]  albuterol (PROVENTIL HFA;VENTOLIN HFA) 108 (90 Base) MCG/ACT inhaler Inhale 2 puffs into the lungs every 6 (six) hours as needed for wheezing or shortness of breath.    [provider]  amLODipine (NORVASC) 10 MG tablet Take 10 mg by mouth daily. Give 1 tablet by mouth one time  a day for HTN    [provider]  atorvastatin (LIPITOR) 20 MG tablet Take 20 mg by mouth at bedtime.    [provider]  bisacodyl (DULCOLAX) 10 MG suppository Place 1 suppository (10 mg total) rectally daily as needed for moderate constipation. 12/02/15   [name], MD  buPROPion (WELLBUTRIN XL) 300 MG 24 hr tablet Take 300 mg by mouth at bedtime.    [provider]  calcium carbonate (TUMS - DOSED IN MG ELEMENTAL CALCIUM) 500 MG chewable tablet Chew 2 tablets by mouth every 8 (eight) hours as needed for indigestion or heartburn.    [provider]  cetirizine (ZYRTEC) 10 MG tablet Take 10 mg by mouth daily.    [provider]  CETYLPYRIDINIUM CHLORIDE MT Use as directed 15 mLs in the mouth or throat every 4 (four) hours as needed (dry mouth).    [provider]  Cholecalciferol (VITAMIN D-3) 5000 units TABS Take 5,000 Units by mouth daily.    [provider]  doxycycline (VIBRAMYCIN) 100 MG capsule Take 1 capsule (100 mg total) by mouth  2 (two) times daily. 10/24/18   Rayburn, Neta Mends, [name]  eszopiclone (LUNESTA) 1 MG TABS tablet Take 1 mg by mouth at bedtime. Take immediately before bedtime    [provider]  finasteride (PROSCAR) 5 MG tablet Take 5 mg by mouth daily.    [provider]  furosemide (LASIX) 40 MG tablet Take 40 mg by mouth daily.    [provider]  glipiZIDE (GLUCOTROL) 10 MG tablet Take 10 mg by mouth daily before breakfast.    [provider]  glucagon (GLUCAGON EMERGENCY) 1 MG injection Inject 1 mg into the muscle once as needed (for CBG <70).     [provider]  guaiFENesin (MUCINEX) 600 MG 12 hr tablet Take 600 mg by mouth every 12 (twelve) hours as needed for cough (congestion).  04/23/18   [provider]  insulin lispro (HUMALOG KWIKPEN) 100 UNIT/ML KwikPen Inject 0-10 Units into the skin See admin instructions. Inject per sliding scale 3 times daily before meals; CBG <70 = notify MD, 0-150 = 0 units, 151-200 = 2 units, 201-250 = 4 units, 251-300 = 6 units, 301-350 = 8 units, 351-400 = 10 units, >400 = notify MD    [provider]  levofloxacin (LEVAQUIN) 750 MG tablet Take 1 tablet (750 mg total) by mouth daily. 11/11/18   [name], MD  lidocaine (LIDODERM) 5 % Place 1 patch onto the skin daily. Apply to Right stump. Remove & Discard patch within 12 hours or as directed by MD    [provider]  magnesium oxide (MAG-OX) 400 MG tablet Take 400 mg by mouth 2 (two) times daily.    [provider]  metoprolol tartrate (LOPRESSOR) 25 MG tablet Take 25 mg by mouth daily.    [provider]  mirabegron ER (MYRBETRIQ) 50 MG TB24 tablet Take 50 mg by mouth daily.    [provider]  mometasone (NASONEX) 50 MCG/ACT nasal spray Place 1 spray into the nose at bedtime.     [provider]  morphine (MS CONTIN) 15 MG 12 hr tablet Take 1 tablet (15 mg total) by mouth every 12 (twelve) hours. 10/24/18    Rayburn, Neta Mends, [name]  omeprazole (PRILOSEC) 40 MG capsule Take 40 mg by mouth daily.    [provider]  ondansetron (ZOFRAN) 4 MG tablet Take 4 mg by mouth every  6 (six) hours as needed for nausea or vomiting.    [provider]  oxybutynin (DITROPAN XL) 15 MG 24 hr tablet Take 15 mg by mouth at bedtime.    [provider]  Oxycodone HCl 20 MG TABS Take 1 tablet (20 mg total) by mouth every 6 (six) hours as needed (pain). 10/24/18   Rayburn, Neta Mends, [name]  PARoxetine (PAXIL) 20 MG tablet Take 20 mg by mouth at bedtime.    [provider]  polyethylene glycol (MIRALAX / GLYCOLAX) packet Take 17 g by mouth daily as needed for mild constipation. 06/09/18   Shelly Coss, MD  pregabalin (LYRICA) 150 MG capsule Take 1 capsule (150 mg total) by mouth 2 (two) times daily. 10/24/18   Rayburn, Neta Mends, [name]  senna-docusate (SENOKOT-S) 8.6-50 MG tablet Take 2 tablets by mouth at bedtime.    [provider]  tamsulosin (FLOMAX) 0.4 MG CAPS capsule Take 0.8 mg by mouth every evening.  11/26/13   [provider]  terbinafine (LAMISIL) 250 MG tablet Take 250 mg by mouth daily.    [provider]  Tiotropium Bromide Monohydrate (SPIRIVA RESPIMAT) 1.25 MCG/ACT AERS Inhale 2 puffs into the lungs daily.    [provider]  tiZANidine (ZANAFLEX) 4 MG tablet Take 4 mg by mouth every 8 (eight) hours as needed for muscle spasms.    [provider]  vitamin B-12 (CYANOCOBALAMIN) 1000 MCG tablet Take 1,000 mcg by mouth daily.    [provider]    Family History Family History  Problem Relation Age of Onset  . Cancer Other   . Diabetes Other     Social History Social History   Tobacco Use  . Smoking status: Current Every Day Smoker    Packs/day: 0.50    Types: Cigarettes  . Smokeless tobacco: Never Used  Substance Use Topics  . Alcohol use: No  . Drug use: No     Allergies   Patient has  no known allergies.   Review of Systems Review of Systems  All other systems reviewed and are negative.    Physical Exam Updated Vital Signs BP 139/81 (BP Location: Right Arm)   Pulse 66   Temp 98.6 F (37 C) (Oral)   Resp 18   SpO2 93%   Physical Exam Vitals signs and nursing note reviewed.  Constitutional:      Appearance: He is well-developed.  HENT:     Head: Normocephalic.  Neck:     Musculoskeletal: Normal range of motion.  Pulmonary:     Effort: Pulmonary effort is normal.  Abdominal:     General: There is no distension.  Musculoskeletal: Normal range of motion.     Comments: Wound dehiscence of the right BKA with surrounding erythema and associated wound drainage.  Neurological:     Mental Status: He is alert and oriented to person, place, and time.      ED Treatments / Results  Labs (all labs ordered are listed, but only abnormal results are displayed) Labs Reviewed  CBC WITH DIFFERENTIAL/PLATELET - Abnormal; Notable for the following components:      Result Value   RBC 3.80 (*)    Hemoglobin 9.3 (*)    HCT 31.3 (*)    MCH 24.5 (*)    MCHC 29.7 (*)    RDW 16.9 (*)    All other components within normal limits  BASIC METABOLIC PANEL - Abnormal; Notable for the following components:   Glucose, Bld 107 (*)  BUN 21 (*)    Creatinine, Ser 1.79 (*)    GFR calc non Af Amer 41 (*)    GFR calc Af Amer 47 (*)    All other components within normal limits  URINALYSIS, ROUTINE W REFLEX MICROSCOPIC - Abnormal; Notable for the following components:   Color, Urine STRAW (*)    All other components within normal limits  SARS CORONAVIRUS 2 (HOSPITAL ORDER, [facility])  CULTURE, BLOOD (ROUTINE X 2)  CULTURE, BLOOD (ROUTINE X 2)  URINE CULTURE  CBG MONITORING, ED  CBG MONITORING, ED    EKG None  Radiology Dg Knee Complete 4 Views Right  Result Date: 11/16/2018 CLINICAL DATA:  Infection and pain at amputation site below the  right knee. Reported fall. EXAM: RIGHT KNEE - COMPLETE 4+ VIEW COMPARISON:  10/20/2018 right lower extremity radiographs FINDINGS: Status post right lower extremity amputation at the level of the proximal tibial shaft. New soft tissue swelling throughout amputation site. New soft tissue defect overlying the tibial stump. Curvilinear lucency at the anterolateral margin of the right tibial stump suggests a nondisplaced fracture. No new osseous erosions or new periosteal reaction. No suspicious focal osseous lesions. No right knee joint effusion. No radiopaque foreign body. IMPRESSION: New curvilinear lucency at the anterolateral margin of the right tibial stump, suggesting a nondisplaced fracture. New soft tissue swelling throughout the amputation site. New soft tissue defect overlying the tibial stump. No specific radiographic findings of acute osteomyelitis. Electronically Signed   By: Ilona Sorrel M.D.   On: 11/16/2018 15:09    Procedures Procedures (including critical care time)  Medications Ordered in ED Medications  vancomycin (VANCOCIN) 1,250 mg in sodium chloride 0.9 % 250 mL IVPB (has no administration in time range)  oxyCODONE-acetaminophen (PERCOCET/ROXICET) 5-325 MG per tablet 1 tablet (has no administration in time range)  HYDROmorphone (DILAUDID) injection 1 mg (1 mg Intravenous Given 11/16/18 1410)  piperacillin-tazobactam (ZOSYN) IVPB 3.375 g (0 g Intravenous Stopped 11/16/18 1453)  vancomycin (VANCOCIN) 2,000 mg in sodium chloride 0.9 % 500 mL IVPB (2,000 mg Intravenous New Bag/Given 11/16/18 1424)  HYDROmorphone (DILAUDID) injection 1 mg (1 mg Intravenous Given 11/16/18 1534)  ondansetron (ZOFRAN) injection 4 mg (4 mg Intravenous Given 11/16/18 1653)     Initial Impression / Assessment and Plan / ED Course  I have reviewed the triage vital signs and the nursing notes.  Pertinent labs & imaging results that were available during my care of the patient were reviewed by me and considered  in my medical decision making (see chart for details).        Patient will be admitted for wound dehiscence and worsening infection despite appropriate outpatient oral antibiotics and need for surgical intervention.  Case discussed with Dr. Louanne Skye of orthopedic surgery who works with [name].  Patient will be seen in consultation.  Plan for likely operative management in the coming days by [name].  N.p.o. after midnight.  IV antibiotics given here in the emergency department.  Attempted pain control.  Patient updated.  Final Clinical Impressions(s) / ED Diagnoses   Final diagnoses:  Cellulitis of right knee  Chronic infection of amputation stump [facility])    ED Discharge Orders    None       Jola Schmidt, MD 11/16/18 [phone]

## 2018-11-16 NOTE — ED Notes (Signed)
May provide food / drink per Dr. Venora Maples.

## 2018-11-16 NOTE — ED Notes (Signed)
ED TO INPATIENT HANDOFF REPORT  ED Nurse Name and Phone #: William Hamburger RN [phone]  S Name/Age/Gender Gregory [name] 58 y.o. male Room/Bed: 043C/043C  Code Status   Code Status: Prior  Home/SNF/Other Home Patient oriented to: situation Is this baseline? Yes   Triage Complete: Triage complete  Chief Complaint Fall  Triage Note Pt arrives from [facility] reporting fall last week with new pain and drainage to new BKA.  Pt reports pain 10/10.  Stump noted to be open, red. Pt reports exposure to COVID at Dr. Jess Barters office, denies fever, cough, body aches.   Allergies No Known Allergies  Level of Care/Admitting Diagnosis ED Disposition    ED Disposition Condition Comment   Admit  The patient appears reasonably stabilized for admission considering the current resources, flow, and capabilities available in the ED at this time, and I doubt any other [facility] requiring further screening and/or treatment in the ED prior to admission is  present.       B Medical/Surgery History Past Medical History:  Diagnosis Date  . Acquired absence of left great toe ([place])   . Acquired absence of right leg below knee ([place])   . Acute osteomyelitis of calcaneum, right ([place]) 10/02/2016  . Acute respiratory failure ([place])   . Amputation stump infection ([place]) 08/12/2018  . Anemia   . Basal cell carcinoma, eyelid   . Cancer ([place])   . Candidiasis 08/12/2018  . CHF (congestive heart failure) (HCC)    chronic diastolic   . Chronic back pain   . Chronic kidney disease    stage 3   . Chronic multifocal osteomyelitis (HCC)    of ankle and foot   . Chronic pain syndrome   . COPD (chronic obstructive pulmonary disease) ([place])   . DDD (degenerative disc disease), lumbar   . Depression    major depressive disorder   . Diabetes mellitus without complication ([place])    type 2   . Diabetic ulcer of toe of left foot ([place]) 10/02/2016  . Difficulty in walking, not elsewhere classified   . 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 Foot drop, bilateral   . Foot drop, right 12/27/2015  . GERD (gastroesophageal reflux disease)   . Herpesviral vesicular dermatitis   . Hyperlipidemia   . Hyperlipidemia   . Hypertension   . Insomnia   . Malignant melanoma of other parts of face ([place])   . MRSA bacteremia   . Muscle weakness (generalized)   . Nasal congestion   . Necrosis of toe ([place]) 07/08/2018  . Neuromuscular dysfunction of bladder   . Osteomyelitis ([place])   . Osteomyelitis ([place])   . Osteomyelitis ([place])    right BKA  . Other idiopathic peripheral autonomic neuropathy   . Retention of urine, unspecified   . Squamous cell carcinoma of skin   . Unsteadiness on feet   . Urinary retention   . Urinary retention   . Urinary tract infection   . Wears dentures   . Wears glasses    Past Surgical History:  Procedure Laterality Date  . AMPUTATION Left 03/29/2017   Procedure: LEFT GREAT TOE AMPUTATION AT METATARSOPHALANGEAL JOINT;  Surgeon: [name], MD;  Location: [place];  Service: Orthopedics;  Laterality: Left;  . AMPUTATION Right 03/29/2017   Procedure: RIGHT BELOW KNEE AMPUTATION;  Surgeon: [name], MD;  Location: [place];  Service: Orthopedics;  Laterality: Right;  . AMPUTATION Left 06/06/2018   Procedure: LEFT 2ND TOE AMPUTATION;  Surgeon: [name],  Illene Regulus, MD;  Location: [place];  Service: Orthopedics;  Laterality: Left;  . ANTERIOR CERVICAL CORPECTOMY N/A 11/25/2015   Procedure: ANTERIOR CERVICAL FIVE CORPECTOMY Cervical four - six fusion;  Surgeon: Consuella Lose, MD;  Location: [facility];  Service: Neurosurgery;  Laterality: N/A;  ANTERIOR CERVICAL FIVE CORPECTOMY Cervical four - six fusion  . APPLICATION OF WOUND VAC Right 10/22/2018   Procedure: Application Of Wound Vac;  Surgeon: [name], MD;  Location: [place];  Service: Orthopedics;  Laterality: Right;  . CYSTOSCOPY WITH RETROGRADE URETHROGRAM N/A 04/25/2018   Procedure: CYSTOSCOPY WITH RETROGRADE URETHROGRAM/ BALLOON DILATION;  Surgeon:  Kathie Rhodes, MD;  Location: [facility];  Service: Urology;  Laterality: N/A;  . MULTIPLE TOOTH EXTRACTIONS    . POSTERIOR CERVICAL FUSION/FORAMINOTOMY N/A 11/29/2015   Procedure: Cervical Three-Cervical Seven Posterior Cervical Laminectomy with Fusion;  Surgeon: Consuella Lose, MD;  Location: [place] [facility];  Service: Neurosurgery;  Laterality: N/A;  Cervical Three-Cervical Seven Posterior Cervical Laminectomy with Fusion  . STUMP REVISION Right 10/22/2018   Procedure: REVISION RIGHT BELOW KNEE AMPUTATION;  Surgeon: [name], MD;  Location: [place];  Service: Orthopedics;  Laterality: Right;     A IV Location/Drains/Wounds Patient Lines/Drains/Airways Status   Active Line/Drains/Airways    Name:   Placement date:   Placement time:   Site:   Days:   Peripheral IV 11/16/18 Left Hand   11/16/18    1345    Hand   less than 1   Peripheral IV 11/16/18 Right Hand   11/16/18    1355    Hand   less than 1   Negative Pressure Wound Therapy Foot Left   06/06/18    1334    -   163   Negative Pressure Wound Therapy Leg Right   10/22/18    1223    -   25   Incision (Closed) 11/25/15 Neck Right   11/25/15    2043     1087   Incision (Closed) 11/29/15 Neck   11/29/15    1751     1083   Incision (Closed) 03/29/17 Leg Right   03/29/17    1058     597   Incision (Closed) 03/29/17 Foot Left   03/29/17    1058     597   Incision (Closed) 06/06/18 Foot Left   06/06/18    1301     163   Incision (Closed) 06/06/18 Foot Left   06/06/18    1305     163   Incision (Closed) 10/22/18 Leg Right   10/22/18    1202     25   Pressure Ulcer 11/10/15 Stage I -  Intact skin with non-blanchable redness of a localized area usually over a bony prominence.   11/10/15    0800     1102   Pressure Injury 06/03/18 Stage II -  Partial thickness loss of dermis presenting as a shallow open ulcer with a red, pink wound bed without slough.   06/03/18    2151     166   Pressure Injury 06/02/18 Stage II -  Partial thickness loss of dermis  presenting as a shallow open ulcer with a red, pink wound bed without slough.   06/02/18    1105     167   Pressure Injury 06/09/18 Stage II -  Partial thickness loss of dermis presenting as a shallow open ulcer with a red, pink wound bed without slough.  06/09/18    1107     160   Wound / Incision (Open or Dehisced) 11/12/15 Toe (Comment  which one) Right    11/12/15    0800    Toe (Comment  which one)   1100   Wound / Incision (Open or Dehisced) 03/25/17 Diabetic ulcer Foot Right 3.5 x 3.5   03/25/17    2100    Foot   601   Wound / Incision (Open or Dehisced) 03/25/17 Diabetic ulcer Heel Right   03/25/17    2100    Heel   601   Wound / Incision (Open or Dehisced) 03/25/17 Diabetic ulcer Ankle Right   03/25/17    2100    Ankle   601   Wound / Incision (Open or Dehisced) 03/25/17 Diabetic ulcer Toe (Comment  which one) Right   03/25/17    2100    Toe (Comment  which one)   601   Wound / Incision (Open or Dehisced) 03/25/17 Diabetic ulcer Toe (Comment  which one) Left   03/25/17    2100    Toe (Comment  which one)   601          Intake/Output Last 24 hours No intake or output data in the 24 hours ending 11/16/18 1724  Labs/Imaging Results for orders placed or performed during the hospital encounter of 11/16/18 (from the past 48 hour(s))  CBC with Differential/Platelet     Status: Abnormal   Collection Time: 11/16/18  1:56 PM  Result Value Ref Range   WBC 7.9 4.0 - 10.5 K/uL   RBC 3.80 (L) 4.22 - 5.81 MIL/uL   Hemoglobin 9.3 (L) 13.0 - 17.0 g/dL   HCT 31.3 (L) 39.0 - 52.0 %   MCV 82.4 80.0 - 100.0 fL   MCH 24.5 (L) 26.0 - 34.0 pg   MCHC 29.7 (L) 30.0 - 36.0 g/dL   RDW 16.9 (H) 11.5 - 15.5 %   Platelets 241 150 - 400 K/uL   nRBC 0.0 0.0 - 0.2 %   Neutrophils Relative % 63 %   Neutro Abs 5.0 1.7 - 7.7 K/uL   Lymphocytes Relative 27 %   Lymphs Abs 2.1 0.7 - 4.0 K/uL   Monocytes Relative 6 %   Monocytes Absolute 0.5 0.1 - 1.0 K/uL   Eosinophils Relative 2 %   Eosinophils Absolute 0.2  0.0 - 0.5 K/uL   Basophils Relative 1 %   Basophils Absolute 0.1 0.0 - 0.1 K/uL   Immature Granulocytes 1 %   Abs Immature Granulocytes 0.04 0.00 - 0.07 K/uL    Comment: Performed at [facility], [address], [place], [place] [address]  Basic metabolic panel     Status: Abnormal   Collection Time: 11/16/18  1:56 PM  Result Value Ref Range   Sodium 136 135 - 145 mmol/L   Potassium 4.4 3.5 - 5.1 mmol/L   Chloride 99 98 - 111 mmol/L   CO2 27 22 - 32 mmol/L   Glucose, Bld 107 (H) 70 - 99 mg/dL   BUN 21 (H) 6 - 20 mg/dL   Creatinine, Ser 1.79 (H) 0.61 - 1.24 mg/dL   Calcium 9.0 8.9 - 10.3 mg/dL   GFR calc non Af Amer 41 (L) >60 mL/min   GFR calc Af Amer 47 (L) >60 mL/min   Anion gap 10 5 - 15    Comment: Performed at [facility] [address] [place], [place] [address]  SARS Coronavirus 2 (CEPHEID - Performed in [place] hospital lab), Hosp Order     Status: None   Collection Time: 11/16/18  1:57 PM  Result Value Ref Range   SARS Coronavirus 2 NEGATIVE NEGATIVE    Comment: (NOTE) If result is NEGATIVE SARS-CoV-2 target nucleic acids are NOT DETECTED. The SARS-CoV-2 RNA is generally detectable in upper and lower  respiratory specimens during the acute phase of infection. The lowest  concentration of SARS-CoV-2 viral copies this assay can detect is 250  copies / mL. A negative result does not preclude SARS-CoV-2 infection  and should not be used as the sole basis for treatment or other  patient management decisions.  A negative result may occur with  improper specimen collection / handling, submission of specimen other  than nasopharyngeal swab, presence of viral mutation(s) within the  areas targeted by this assay, and inadequate number of viral copies  (<250 copies / mL). A negative result must be combined with clinical  observations, patient history, and epidemiological information. If result is POSITIVE SARS-CoV-2 target nucleic acids are DETECTED. The  SARS-CoV-2 RNA is generally detectable in upper and lower  respiratory specimens dur ing the acute phase of infection.  Positive  results are indicative of active infection with SARS-CoV-2.  Clinical  correlation with patient history and other diagnostic information is  necessary to determine patient infection status.  Positive results do  not rule out bacterial infection or co-infection with other viruses. If result is PRESUMPTIVE POSTIVE SARS-CoV-2 nucleic acids MAY BE PRESENT.   A presumptive positive result was obtained on the submitted specimen  and confirmed on repeat testing.  While 2019 novel coronavirus  (SARS-CoV-2) nucleic acids may be present in the submitted sample  additional confirmatory testing may be necessary for epidemiological  and / or clinical management purposes  to differentiate between  SARS-CoV-2 and other Sarbecovirus currently known to infect humans.  If clinically indicated additional testing with an alternate test  methodology [phone]) is advised. The SARS-CoV-2 RNA is generally  detectable in upper and lower respiratory sp ecimens during the acute  phase of infection. The expected result is Negative. Fact Sheet for Patients:  StrictlyIdeas.no Fact Sheet for Healthcare Providers: BankingDealers.co.za This test is not yet approved or cleared by the [place] FDA and has been authorized for detection and/or diagnosis of SARS-CoV-2 by FDA under an Emergency Use Authorization (EUA).  This EUA will remain in effect (meaning this test can be used) for the duration of the COVID-19 declaration under Section 564(b)(1) of the Act, 21 U.S.C. section 360bbb-3(b)(1), unless the authorization is terminated or revoked sooner. Performed at [facility], [place] [address], [place], [place] [address]   Urinalysis, Routine w reflex microscopic     Status: Abnormal   Collection Time: 11/16/18  1:57 PM  Result Value  Ref Range   Color, Urine STRAW (A) YELLOW   APPearance CLEAR CLEAR   Specific Gravity, Urine 1.006 1.005 - 1.030   pH 7.0 5.0 - 8.0   Glucose, UA NEGATIVE NEGATIVE mg/dL   Hgb urine dipstick NEGATIVE NEGATIVE   Bilirubin Urine NEGATIVE NEGATIVE   Ketones, ur NEGATIVE NEGATIVE mg/dL   Protein, ur NEGATIVE NEGATIVE mg/dL   Nitrite NEGATIVE NEGATIVE   Leukocytes,Ua NEGATIVE NEGATIVE    Comment: Performed at [facility] [address], [place],  [address]  CBG monitoring, ED     Status: None   Collection Time: 11/16/18  3:42 PM  Result Value  Ref Range   Glucose-Capillary 80 70 - 99 mg/dL  POC CBG, ED     Status: None   Collection Time: 11/16/18  4:44 PM  Result Value Ref Range   Glucose-Capillary 86 70 - 99 mg/dL   Dg Knee Complete 4 Views Right  Result Date: 11/16/2018 CLINICAL DATA:  Infection and pain at amputation site below the right knee. Reported fall. EXAM: RIGHT KNEE - COMPLETE 4+ VIEW COMPARISON:  10/20/2018 right lower extremity radiographs FINDINGS: Status post right lower extremity amputation at the level of the proximal tibial shaft. New soft tissue swelling throughout amputation site. New soft tissue defect overlying the tibial stump. Curvilinear lucency at the anterolateral margin of the right tibial stump suggests a nondisplaced fracture. No new osseous erosions or new periosteal reaction. No suspicious focal osseous lesions. No right knee joint effusion. No radiopaque foreign body. IMPRESSION: New curvilinear lucency at the anterolateral margin of the right tibial stump, suggesting a nondisplaced fracture. New soft tissue swelling throughout the amputation site. New soft tissue defect overlying the tibial stump. No specific radiographic findings of acute osteomyelitis. Electronically Signed   By: Ilona Sorrel M.D.   On: 11/16/2018 15:09    Pending Labs Unresulted Labs (From admission, onward)    Start     Ordered   11/16/18 1433  Urine culture  ONCE - STAT,    STAT     11/16/18 1432   11/16/18 1329  Blood culture (routine x 2)  BLOOD CULTURE X 2,   STAT     11/16/18 1329          Vitals/Pain Today's Vitals   11/16/18 1345 11/16/18 1400 11/16/18 1410 11/16/18 1534  BP: 139/77 (!) 146/81  139/81  Pulse: 66 70  66  Resp: 17 14  18   Temp:      TempSrc:      SpO2: 93% 94%  93%  PainSc:   10-Worst pain ever 10-Worst pain ever    Isolation Precautions No active isolations  Medications Medications  vancomycin (VANCOCIN) 1,250 mg in sodium chloride 0.9 % 250 mL IVPB (has no administration in time range)  oxyCODONE-acetaminophen (PERCOCET/ROXICET) 5-325 MG per tablet 1 tablet (has no administration in time range)  morphine 4 MG/ML injection 8 mg (has no administration in time range)  HYDROmorphone (DILAUDID) injection 1 mg (1 mg Intravenous Given 11/16/18 1410)  piperacillin-tazobactam (ZOSYN) IVPB 3.375 g (0 g Intravenous Stopped 11/16/18 1453)  vancomycin (VANCOCIN) 2,000 mg in sodium chloride 0.9 % 500 mL IVPB (2,000 mg Intravenous New Bag/Given 11/16/18 1424)  HYDROmorphone (DILAUDID) injection 1 mg (1 mg Intravenous Given 11/16/18 1534)  ondansetron (ZOFRAN) injection 4 mg (4 mg Intravenous Given 11/16/18 1653)    Mobility non-ambulatory Moderate fall risk   Focused Assessments Cardiac Assessment Handoff:    Lab Results  Component Value Date   TROPONINI 0.03 11/07/2015   No results found for: DDIMER Does the Patient currently have chest pain? No  , Neuro Assessment Handoff:  Swallow screen pass? Yes          Neuro Assessment:   Neuro Checks:      Last Documented NIHSS Modified Score:   Has TPA been given? No If patient is a Neuro Trauma and patient is going to OR before floor call report to 4N Charge nurse: [phone] or [phone]  , Renal Assessment Handoff:  Hemodialysis Schedule:  Last Hemodialysis date and time: n/a     Restricted appendage: n/a  , Pulmonary Assessment Handoff:  Lung  sounds:   O2  Device: Room Air        R Recommendations: See Admitting Provider Note  Report given to:   Additional Notes: Right BKA; recent; wound site open to air,

## 2018-11-16 NOTE — Progress Notes (Signed)
Pharmacy note: heparin   58 yo male here for BKA revision on 6/1. Pharmacy consulted to dose heparin (I discussed with MD and this was ordered for VTE prophylaxis). -hg= 9.3, plt= 241  Plan -heparin 5000 units sq tonight x1 -Will hold further orders until stable post procedure  Hildred Laser, PharmD Clinical Pharmacist **Pharmacist phone directory can now be found on [email or web address] ([email or web address]).  Listed under [place].

## 2018-11-16 NOTE — Progress Notes (Signed)
Pt complaining of severe pain on right stump. PRN meds given as ordered. Demanding for IV pain meds. Pt is upset and yelling. On call made aware. Awaiting for response.

## 2018-11-16 NOTE — Progress Notes (Signed)
Pharmacy Antibiotic Note  Gregory [name] is a 58 y.o. male admitted on 11/16/2018 with wound infection.  Pharmacy has been consulted for vancomycin dosing. Pt is afebrile and WBC are WNL. Scr is elevated.   Plan: Vancomycin 2gm IV x 1 then 1250mg  IV Q24H F/u renal fxn, C&S, clinical status and peak/trough at [facility] F/u continuation of zosyn or other gram negative coverage  Temp (24hrs), Avg:98.6 F (37 C), Min:98.6 F (37 C), Max:98.6 F (37 C)  No results for input(s): WBC, CREATININE, LATICACIDVEN, VANCOTROUGH, VANCOPEAK, VANCORANDOM, GENTTROUGH, GENTPEAK, GENTRANDOM, TOBRATROUGH, TOBRAPEAK, TOBRARND, AMIKACINPEAK, AMIKACINTROU, AMIKACIN in the last 168 hours.  CrCl cannot be calculated (Patient's most recent lab result is older than the maximum 21 days allowed.).    No Known Allergies  Antimicrobials this admission: Vanc 5/31>> Zosyn x 1 5/31  Dose adjustments this admission: N/A  Microbiology results: Pending  Thank you for allowing pharmacy to be a part of this patient's care.  Joretta Eads, Rande Lawman 11/16/2018 1:37 PM

## 2018-11-16 NOTE — H&P (Signed)
[facility] Admission History and Physical Service Pager: [phone]  Patient name: Gregory [name] Medical record number: [id number] Date of birth: [birth year] Age: 58 y.o. Gender: male  Primary Care Provider: [name], MD Consultants: ortho (nitka/duda) Code Status: full  Chief Complaint: Infection at wound site from [facility] earlier this month  Assessment and Plan: Gregory [name] is a 58 y.o. male presenting with infection at wound site from [facility] from [name] May 6.. PMH is significant for HFpEF, diabetes, peripheral neuropathy, edema with stasis changes, history of multiple amputations with revisions, urinary difficulty, chronic pain, COPD, hypertension, hyperlipidemia, blindness in left eye, history of cancer  Infection of wound site a prior BKA from May 6: Patient says he has had a few weeks of signs of infection and redness at stump.  Was recently offered revision by [name] but declined and went home with antibiotics.  Since then he is fallen about 4 to 5 days ago which opened the wound, pain is increased since then and he has now decided he was willing to get surgery revision.  Anabiotic history includes a course of doxy until about 6 days ago, changed to [place] since then.  He denies streaking or fever symptoms.  Dr. Venora Maples in the ED called Dr. due to his partner who suggested to admit to medicine for likely Ortho surgery in the morning, told to continue antibiotics.  X-ray showed soft tissue changes and potential distal fracture at tibia but no osteomyelitis, which does not rule this out.  Exam shows dehisced wound with signs of infection and cellulitis distally, picture in chart -Admit to MedSurg, Dr. Mingo Amber -Continue Vanco and Zosyn perform at request of Ortho -Heparin per farm anticipation of likely procedure in the morning for DVT prophylaxis overnight -Pain control as noted below and chronic pain -N.p.o. at midnight -Monitor vitals  Diabetes:  Patient's most recent A1c was 8.  He says that he does not actually need much insulin at all.  This could be because of his reduced kidney function with GFR 40.  Home meds list glipizide. -Hold home oral meds -Sensitive sliding scale, will not start long-acting tonight as he will be n.p.o. tomorrow for long-term amount of time due to surgery -CBGs 4 times daily -Follow-up A1c  Complaint of nodule under right eye: Patient notes that he had a history of unknown cancer.  Said that he had some lymph nodes removed on the right side.  Since then he has had a puffiness underneath his right eye along the eyelid.  This is not concerning to him and does not change his vision (he is blind on the left) but he says that he has noticed over the last few months that there is a nodule approximately 2 inches below the eye.  This is not painful does not been changing in size but he is concerned about it given his history of cancer. -Can evaluate further with daily exams during this admission, likely follow-up outpatient  History of HFpEF: Last echo in chart was 2017 June.  55 to 60% ejection fraction with inadequate imaging to assess LV wall motion.  Patient is on metoprolol and Lasix at this time, mild crackles on lungs bases bilaterally with history of edema and stasis changes in legs.  No murmur on exam -Continue metoprolol and Lasix -Follow-up on echo  Hypertension: BP unremarkable on admission.  History of home amlodipine, metoprolol, Lasix -Continue home blood pressure medicines monitor vitals -Evaluate utility of ACE inhibitor  on discharge for renal protection  Chronic pain: Confirmed with patient's nursing facility, [facility], that patient takes MS Contin scheduled 15 mg twice daily and Oxley 20 mg every 6 as needed along with pregabalin 150 twice daily.  Patient does have reduced creatinine clearance this does not appear to be an AKI GFR in the 40s -Scheduled Tylenol -Schedule home MS Contin 50 mg twice  daily -Reduce PRN Oxley to 10 mg every 6 as needed -Renally dose Lyrica to 75 mg twice daily, suggest mentioning renal dosing and discharge summary  COPD chronic: No respiratory symptoms in the ED.  Patient is stable on room air, no cough.  Says he has not used albuterol inhaler in days.  Patient take Spiriva at home -[facility] scheduled as a controller medication due to formulary  CKD stage III a-B, not AKI: History of diabetes and hypertension could be contributory.  GFR baseline appears to be in the 40s.  Was 41 on admission.  Patient noted to take Lasix at home.  Does not appear to be on ACE which could be renally protective.  Mild crackles at lung bases bilaterally, but no respiratory symptoms -Consider ACE for discharge -Continue Lasix at this time -Renally dose medications and avoid nephrotoxicity  Urinary difficulty.  Patient notes that he recently went 1 year wearing a cath because he had trouble urinating.  On current medication regiment he says that he generally urinates pretty well.  Does note home Lasix, oxybutynin, finasteride.  States he does not take tamsulosin or Myrbetriq.  Mild crackles on lungs that patient does not notice, clinically.  Edema in left leg is chronic with stasis changes -Continue home Lasix, oxybutynin, finasteride  Hyperlipidemia: Continue home atorvastatin 20 -We will check lipid panel  Depression: Patient has history of this, takes Paxil 20 and Wellbutrin daily.  Feels stable on this -Continue home medicine  History of tobacco abuse: Patient presented to ED with nicotine patch.  Says he smokes 5 to 6 cigarettes daily. -Nicotine patch 7 mg daily  FEN/GI: Heart healthy carb modified diet until midnight when n.p.o. for potential surgery in the morning Prophylaxis: Heparin per pharmacy anticipation of potential surgery  Disposition: MedSurg pending surgery in the morning with Ortho  History of Present Illness:  Gregory [name] is a 58 y.o. male  presenting with signs of infection in right sided BKA from May 6.  He has had multiple revisions of mutation on this leg with [name].  Also with history of amputation of multiple toes on the left.  Picture in chart.  States that he was initially doing well after the amputation for a week or so.  But then started getting signs of infection, he went to see [name] and was offered a revision of this wound and initially declined.  He states that he is recent antibiotics include a course of doxy which ended approximately 7 days ago when it was replaced by Levaquin.  He has been staying at [facility] and they have been ensuring he gets all of his medication on time.  Approximately 4 days ago he had a fall at the facility and landed on the stump of the BKA.  When he did this the wound dehisced and he could see signs of infection deeper into the wound.  Since this fall pain is been increased and he has presented to the ED now willing to consent to surgical revision.  ED work-up included x-ray which did not show osteomyelitis but did show potential  distal fracture to tibia on the right side which is consistent with patient's story of falling.  Exam with cellulitis and purulence to the dehisced wound.  Patient's vitals were stable, pain control was offered via narcotic medication as patient has significant opioid regimen at home.  Patient's renal function is within normal limits for them at CKD 3, and white count of 7, patient is diabetic but glucose is within normal limits, patient with no respiratory symptoms, no fevers and COVID negative, blood culture and urine culture pending  Review Of Systems: Per HPI with the following additions:   Review of Systems  Constitutional: Negative for chills, diaphoresis, fever and malaise/fatigue.  HENT: Negative for congestion, hearing loss and sore throat.   Eyes:       Blind on left side  Respiratory: Negative for cough, hemoptysis, sputum production, shortness of  breath, wheezing and stridor.   Cardiovascular: Positive for leg swelling. Negative for chest pain, palpitations and orthopnea.  Gastrointestinal: Negative for abdominal pain, constipation, diarrhea, heartburn, nausea and vomiting.  Genitourinary: Negative for dysuria and urgency.       Chronic urinary difficulties treated medicinally.  Stable  Musculoskeletal: Positive for joint pain.       Chronic leg pain consistent with peripheral neuropathy  Skin:       Purulence and cellulitis around the wound of right BKA but no streaking.  Left side with chronic skin changes and edema below the knee.  Neurological: Negative for sensory change, focal weakness, seizures, loss of consciousness and weakness.  Psychiatric/Behavioral: Positive for depression and substance abuse. Negative for suicidal ideas.       Chronic depression, stable on Paxil and Wellbutrin.  Tobacco but no alcohol or illicit drugs    Patient Active Problem List   Diagnosis Date Noted  . Below-knee amputation of right lower extremity ([place]) 10/22/2018  . Dehiscence of amputation stump ([place])   . Amputation stump infection ([place]) 08/12/2018  . Candidiasis 08/12/2018  . Necrosis of toe ([place]) 07/08/2018  . Amputated toe of left foot ([place]) 06/18/2018  . Streptococcal infection   . Pressure injury of skin 06/06/2018  . Cutaneous abscess of left foot   . Severe protein-calorie malnutrition ([place])   . Cellulitis of left leg   . Septic arthritis ([place]) 06/03/2018  . Idiopathic chronic venous hypertension of left lower extremity with ulcer and inflammation ([place]) 11/14/2017  . Great toe amputation status, left 11/14/2017  . Acquired absence of right leg below knee ([place]) 06/20/2017  . Osteomyelitis ([place]) 03/25/2017  . Skin cancer 03/25/2017  . Depression, recurrent ([place]) 11/05/2016  . Diabetic ulcer of toe of left foot ([place]) 10/02/2016  . 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 Chronic pain 09/27/2016  . Dyslipidemia associated with type 2  diabetes mellitus ([place]) 06/06/2016  . GERD without esophagitis 05/11/2016  . Hypertensive heart disease with congestive heart failure ([place]) 12/15/2015  . Spinal stenosis in cervical region 12/15/2015  . Type II diabetes mellitus with neurological manifestations ([place]) 12/15/2015  . Chronic diastolic CHF (congestive heart failure) ([place])   . Pulmonary hypertension ([place])   . Urinary retention   . Diskitis   . Foot drop, bilateral   . Sepsis with acute renal failure without septic shock ([place])   . MRSA bacteremia 10/03/2015  . COPD (chronic obstructive pulmonary disease) ([place]) 10/03/2015  . Acute osteomyelitis of left foot ([place]) 10/03/2015    Past Medical History: Past Medical History:  Diagnosis Date  . Acquired absence of left great toe ([place])   .  Acquired absence of right leg below knee ([place])   . Acute osteomyelitis of calcaneum, right ([place]) 10/02/2016  . Acute respiratory failure ([place])   . Amputation stump infection ([place]) 08/12/2018  . Anemia   . Basal cell carcinoma, eyelid   . Cancer ([place])   . Candidiasis 08/12/2018  . CHF (congestive heart failure) (HCC)    chronic diastolic   . Chronic back pain   . Chronic kidney disease    stage 3   . Chronic multifocal osteomyelitis (HCC)    of ankle and foot   . Chronic pain syndrome   . COPD (chronic obstructive pulmonary disease) ([place])   . DDD (degenerative disc disease), lumbar   . Depression    major depressive disorder   . Diabetes mellitus without complication ([place])    type 2   . Diabetic ulcer of toe of left foot ([place]) 10/02/2016  . Difficulty in walking, not elsewhere classified   . 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 Foot drop, bilateral   . Foot drop, right 12/27/2015  . GERD (gastroesophageal reflux disease)   . Herpesviral vesicular dermatitis   . Hyperlipidemia   . Hyperlipidemia   . Hypertension   . Insomnia   . Malignant melanoma of other parts of face ([place])   . MRSA bacteremia   . Muscle weakness (generalized)   .  Nasal congestion   . Necrosis of toe ([place]) 07/08/2018  . Neuromuscular dysfunction of bladder   . Osteomyelitis ([place])   . Osteomyelitis ([place])   . Osteomyelitis ([place])    right BKA  . Other idiopathic peripheral autonomic neuropathy   . Retention of urine, unspecified   . Squamous cell carcinoma of skin   . Unsteadiness on feet   . Urinary retention   . Urinary retention   . Urinary tract infection   . Wears dentures   . Wears glasses     Past Surgical History: Past Surgical History:  Procedure Laterality Date  . AMPUTATION Left 03/29/2017   Procedure: LEFT GREAT TOE AMPUTATION AT METATARSOPHALANGEAL JOINT;  Surgeon: [name], MD;  Location: [facility];  Service: Orthopedics;  Laterality: Left;  . AMPUTATION Right 03/29/2017   Procedure: RIGHT BELOW KNEE AMPUTATION;  Surgeon: [name], MD;  Location: [facility];  Service: Orthopedics;  Laterality: Right;  . AMPUTATION Left 06/06/2018   Procedure: LEFT 2ND TOE AMPUTATION;  Surgeon: [name], MD;  Location: [facility];  Service: Orthopedics;  Laterality: Left;  . ANTERIOR CERVICAL CORPECTOMY N/A 11/25/2015   Procedure: ANTERIOR CERVICAL FIVE CORPECTOMY Cervical four - six fusion;  Surgeon: Consuella Lose, MD;  Location: [facility];  Service: Neurosurgery;  Laterality: N/A;  ANTERIOR CERVICAL FIVE CORPECTOMY Cervical four - six fusion  . APPLICATION OF WOUND VAC Right 10/22/2018   Procedure: Application Of Wound Vac;  Surgeon: [name], MD;  Location: [facility];  Service: Orthopedics;  Laterality: Right;  . CYSTOSCOPY WITH RETROGRADE URETHROGRAM N/A 04/25/2018   Procedure: CYSTOSCOPY WITH RETROGRADE URETHROGRAM/ BALLOON DILATION;  Surgeon: Kathie Rhodes, MD;  Location: [facility];  Service: Urology;  Laterality: N/A;  . MULTIPLE TOOTH EXTRACTIONS    . POSTERIOR CERVICAL FUSION/FORAMINOTOMY N/A 11/29/2015   Procedure: Cervical Three-Cervical Seven Posterior Cervical Laminectomy with Fusion;  Surgeon: Consuella Lose, MD;  Location: [facility]  [facility]UR[facility];  Service: Neurosurgery;  Laterality: N/A;  Cervical Three-Cervical Seven Posterior Cervical Laminectomy with Fusion  . STUMP REVISION Right 10/22/2018   Procedure: REVISION RIGHT BELOW KNEE AMPUTATION;  Surgeon: [name],  Illene Regulus, MD;  Location: [facility];  Service: Orthopedics;  Laterality: Right;    Social History: Social History   Tobacco Use  . Smoking status: Current Every Day Smoker    Packs/day: 0.50    Types: Cigarettes  . Smokeless tobacco: Never Used  Substance Use Topics  . Alcohol use: No  . Drug use: No   Additional social history:   Please also refer to relevant sections of EMR.  Family History: Family History  Problem Relation Age of Onset  . Cancer Other   . Diabetes Other      Allergies and Medications: No Known Allergies No current facility-administered medications on file prior to encounter.    Current Outpatient Medications on File Prior to Encounter  Medication Sig Dispense Refill  . acetaminophen (TYLENOL) 325 MG tablet Take 650 mg by mouth every 4 (four) hours as needed (general discomfort).     Marland Kitchen albuterol (PROVENTIL HFA;VENTOLIN HFA) 108 (90 Base) MCG/ACT inhaler Inhale 2 puffs into the lungs every 6 (six) hours as needed for wheezing or shortness of breath.    Marland Kitchen amLODipine (NORVASC) 10 MG tablet Take 10 mg by mouth daily. Give 1 tablet by mouth one time a day for HTN    . atorvastatin (LIPITOR) 20 MG tablet Take 20 mg by mouth at bedtime.    . bisacodyl (DULCOLAX) 10 MG suppository Place 1 suppository (10 mg total) rectally daily as needed for moderate constipation. 12 suppository 0  . buPROPion (WELLBUTRIN XL) 300 MG 24 hr tablet Take 300 mg by mouth at bedtime.    . calcium carbonate (TUMS - DOSED IN MG ELEMENTAL CALCIUM) 500 MG chewable tablet Chew 2 tablets by mouth every 8 (eight) hours as needed for indigestion or heartburn.    . cetirizine (ZYRTEC) 10 MG tablet Take 10 mg by mouth daily.    . CETYLPYRIDINIUM CHLORIDE MT Use as directed 15  mLs in the mouth or throat every 4 (four) hours as needed (dry mouth).    . Cholecalciferol (VITAMIN D-3) 5000 units TABS Take 5,000 Units by mouth daily.    Marland Kitchen doxycycline (VIBRAMYCIN) 100 MG capsule Take 1 capsule (100 mg total) by mouth 2 (two) times daily. 28 capsule 0  . eszopiclone (LUNESTA) 1 MG TABS tablet Take 1 mg by mouth at bedtime. Take immediately before bedtime    . finasteride (PROSCAR) 5 MG tablet Take 5 mg by mouth daily.    . furosemide (LASIX) 40 MG tablet Take 40 mg by mouth daily.    Marland Kitchen glipiZIDE (GLUCOTROL) 10 MG tablet Take 10 mg by mouth daily before breakfast.    . glucagon (GLUCAGON EMERGENCY) 1 MG injection Inject 1 mg into the muscle once as needed (for CBG <70).     Marland Kitchen guaiFENesin (MUCINEX) 600 MG 12 hr tablet Take 600 mg by mouth every 12 (twelve) hours as needed for cough (congestion).     . insulin lispro (HUMALOG KWIKPEN) 100 UNIT/ML KwikPen Inject 0-10 Units into the skin See admin instructions. Inject per sliding scale 3 times daily before meals; CBG <70 = notify MD, 0-150 = 0 units, 151-200 = 2 units, 201-250 = 4 units, 251-300 = 6 units, 301-350 = 8 units, 351-400 = 10 units, >400 = notify MD    . levofloxacin (LEVAQUIN) 750 MG tablet Take 1 tablet (750 mg total) by mouth daily. 30 tablet 0  . lidocaine (LIDODERM) 5 % Place 1 patch onto the skin daily. Apply to Right stump. Remove & Discard  patch within 12 hours or as directed by MD    . magnesium oxide (MAG-OX) 400 MG tablet Take 400 mg by mouth 2 (two) times daily.    . metoprolol tartrate (LOPRESSOR) 25 MG tablet Take 25 mg by mouth daily.    . mirabegron ER (MYRBETRIQ) 50 MG TB24 tablet Take 50 mg by mouth daily.    . mometasone (NASONEX) 50 MCG/ACT nasal spray Place 1 spray into the nose at bedtime.     Marland Kitchen morphine (MS CONTIN) 15 MG 12 hr tablet Take 1 tablet (15 mg total) by mouth every 12 (twelve) hours. 14 tablet 0  . omeprazole (PRILOSEC) 40 MG capsule Take 40 mg by mouth daily.    . ondansetron (ZOFRAN) 4  MG tablet Take 4 mg by mouth every 6 (six) hours as needed for nausea or vomiting.    Marland Kitchen oxybutynin (DITROPAN XL) 15 MG 24 hr tablet Take 15 mg by mouth at bedtime.    . Oxycodone HCl 20 MG TABS Take 1 tablet (20 mg total) by mouth every 6 (six) hours as needed (pain). 28 tablet 0  . PARoxetine (PAXIL) 20 MG tablet Take 20 mg by mouth at bedtime.    . polyethylene glycol (MIRALAX / GLYCOLAX) packet Take 17 g by mouth daily as needed for mild constipation. 14 each 0  . pregabalin (LYRICA) 150 MG capsule Take 1 capsule (150 mg total) by mouth 2 (two) times daily. 14 capsule 0  . senna-docusate (SENOKOT-S) 8.6-50 MG tablet Take 2 tablets by mouth at bedtime.    . tamsulosin (FLOMAX) 0.4 MG CAPS capsule Take 0.8 mg by mouth every evening.     . terbinafine (LAMISIL) 250 MG tablet Take 250 mg by mouth daily.    . Tiotropium Bromide Monohydrate (SPIRIVA RESPIMAT) 1.25 MCG/ACT AERS Inhale 2 puffs into the lungs daily.    Marland Kitchen tiZANidine (ZANAFLEX) 4 MG tablet Take 4 mg by mouth every 8 (eight) hours as needed for muscle spasms.    . vitamin B-12 (CYANOCOBALAMIN) 1000 MCG tablet Take 1,000 mcg by mouth daily.      Objective: BP 139/81 (BP Location: Right Arm)   Pulse 66   Temp 98.6 F (37 C) (Oral)   Resp 18   SpO2 93%  Exam: General: No acute distress, obese on room air Eyes: Blindness in left eye, no conjunctivitis or drainage ENTM: Subcutaneous nodule Neck: No abnormality noted Cardiovascular: Regular rate and rhythm, no murmur to my exam Respiratory: No increased work of breathing, no cough, no stridor, minor crackles to bases bilaterally Gastrointestinal: Obese, no changes per patient, no pain MSK: Right BKA with redness of cellulitis distally wound is dehisced approximately 2 inches, no active bleeding, purulence present.  On right side there are chronic stasis changes in the skin with edema distally to the knee.  Distal foot on the left is bandaged and patient says that he has history of  amputations as well. Neuro: Grossly intact with no deficits noted Psych: Patient is processing information and plan appropriately  Labs and Imaging: CBC BMET  Recent Labs  Lab 11/16/18 1356  WBC 7.9  HGB 9.3*  HCT 31.3*  PLT 241   Recent Labs  Lab 11/16/18 1356  NA 136  K 4.4  CL 99  CO2 27  BUN 21*  CREATININE 1.79*  GLUCOSE 107*  CALCIUM 9.0     Dg Knee Complete 4 Views Right  Result Date: 11/16/2018 CLINICAL DATA:  Infection and pain at amputation site below  the right knee. Reported fall. EXAM: RIGHT KNEE - COMPLETE 4+ VIEW COMPARISON:  10/20/2018 right lower extremity radiographs FINDINGS: Status post right lower extremity amputation at the level of the proximal tibial shaft. New soft tissue swelling throughout amputation site. New soft tissue defect overlying the tibial stump. Curvilinear lucency at the anterolateral margin of the right tibial stump suggests a nondisplaced fracture. No new osseous erosions or new periosteal reaction. No suspicious focal osseous lesions. No right knee joint effusion. No radiopaque foreign body. IMPRESSION: New curvilinear lucency at the anterolateral margin of the right tibial stump, suggesting a nondisplaced fracture. New soft tissue swelling throughout the amputation site. New soft tissue defect overlying the tibial stump. No specific radiographic findings of acute osteomyelitis. Electronically Signed   By: Ilona Sorrel M.D.   On: 11/16/2018 15:09    Sherene Sires, DO 11/16/2018, 5:26 PM PGY-2, [facility] pager: [phone], text pages welcome

## 2018-11-16 NOTE — Progress Notes (Signed)
Pharmacy Antibiotic Note  Gregory [name] is a 58 y.o. male admitted on 11/16/2018 with wound infection.  Pharmacy has been consulted for vancomycin and zosyn dosing. Pt is afebrile and WBC are WNL. Scr is elevated.   Plan: Vancomycin 2gm IV x 1 then 1250mg  IV Q24H Zosyn E I F/u renal fxn, C&S, clinical status and peak/trough at [facility] F/u continuation of zosyn or other gram negative coverage  Temp (24hrs), Avg:98.6 F (37 C), Min:98.6 F (37 C), Max:98.6 F (37 C)  Recent Labs  Lab 11/16/18 1356  WBC 7.9  CREATININE 1.79*    Estimated Creatinine Clearance: 57.4 mL/min (A) (by C-G formula based on SCr of 1.79 mg/dL (H)).    No Known Allergies  Antimicrobials this admission: Vanc 5/31>> Zosyn x 1 5/31  Dose adjustments this admission: N/A  Microbiology results: Pending  Thank you for allowing pharmacy to be a part of this patient's care.  Excell Seltzer Poteet 11/16/2018 10:29 PM

## 2018-11-16 NOTE — ED Triage Notes (Signed)
Pt arrives from [facility] reporting fall last week with new pain and drainage to new BKA.  Pt reports pain 10/10.  Stump noted to be open, red. Pt reports exposure to COVID at Dr. Jess Barters office, denies fever, cough, body aches.

## 2018-11-16 NOTE — ED Notes (Signed)
Nurse navigator spoke with patient who states he has nausea and feels his blood sugar is low. Doctor notified. Patient is contacting family himself.

## 2018-11-17 ENCOUNTER — Inpatient Hospital Stay (HOSPITAL_COMMUNITY): Payer: Medicaid Other

## 2018-11-17 DIAGNOSIS — I890 Lymphedema, not elsewhere classified: Secondary | ICD-10-CM

## 2018-11-17 DIAGNOSIS — T8740 Infection of amputation stump, unspecified extremity: Secondary | ICD-10-CM | POA: Insufficient documentation

## 2018-11-17 DIAGNOSIS — E1142 Type 2 diabetes mellitus with diabetic polyneuropathy: Secondary | ICD-10-CM

## 2018-11-17 LAB — CBC
HCT: 31.1 % — ABNORMAL LOW (ref 39.0–52.0)
Hemoglobin: 9.3 g/dL — ABNORMAL LOW (ref 13.0–17.0)
MCH: 24.7 pg — ABNORMAL LOW (ref 26.0–34.0)
MCHC: 29.9 g/dL — ABNORMAL LOW (ref 30.0–36.0)
MCV: 82.7 fL (ref 80.0–100.0)
Platelets: 208 10*3/uL (ref 150–400)
RBC: 3.76 MIL/uL — ABNORMAL LOW (ref 4.22–5.81)
RDW: 17.3 % — ABNORMAL HIGH (ref 11.5–15.5)
WBC: 7.2 10*3/uL (ref 4.0–10.5)
nRBC: 0 % (ref 0.0–0.2)

## 2018-11-17 LAB — BASIC METABOLIC PANEL
Anion gap: 10 (ref 5–15)
BUN: 23 mg/dL — ABNORMAL HIGH (ref 6–20)
CO2: 27 mmol/L (ref 22–32)
Calcium: 8.6 mg/dL — ABNORMAL LOW (ref 8.9–10.3)
Chloride: 99 mmol/L (ref 98–111)
Creatinine, Ser: 1.8 mg/dL — ABNORMAL HIGH (ref 0.61–1.24)
GFR calc Af Amer: 47 mL/min — ABNORMAL LOW (ref >60)
GFR calc non Af Amer: 40 mL/min — ABNORMAL LOW (ref >60)
Glucose, Bld: 185 mg/dL — ABNORMAL HIGH (ref 70–99)
Potassium: 4 mmol/L (ref 3.5–5.1)
Sodium: 136 mmol/L (ref 135–145)

## 2018-11-17 LAB — RETICULOCYTES
Immature Retic Fract: 20.7 % — ABNORMAL HIGH (ref 2.3–15.9)
RBC.: 3.75 MIL/uL — ABNORMAL LOW (ref 4.22–5.81)
Retic Count, Absolute: 71.6 10*3/uL (ref 19.0–186.0)
Retic Ct Pct: 1.9 % (ref 0.4–3.1)

## 2018-11-17 LAB — HEMOGLOBIN A1C
Hgb A1c MFr Bld: 7.7 % — ABNORMAL HIGH (ref 4.8–5.6)
Mean Plasma Glucose: 174.29 mg/dL

## 2018-11-17 LAB — GLUCOSE, CAPILLARY
Glucose-Capillary: 113 mg/dL — ABNORMAL HIGH (ref 70–99)
Glucose-Capillary: 218 mg/dL — ABNORMAL HIGH (ref 70–99)
Glucose-Capillary: 153 mg/dL — ABNORMAL HIGH (ref 70–99)
Glucose-Capillary: 157 mg/dL — ABNORMAL HIGH (ref 70–99)

## 2018-11-17 LAB — IRON AND TIBC
Iron: 23 ug/dL — ABNORMAL LOW (ref 45–182)
Saturation Ratios: 9 % — ABNORMAL LOW (ref 17.9–39.5)
TIBC: 266 ug/dL (ref 250–450)
UIBC: 243 ug/dL

## 2018-11-17 LAB — LIPID PANEL
Cholesterol: 92 mg/dL (ref 0–200)
HDL: 19 mg/dL — ABNORMAL LOW (ref >40)
LDL Cholesterol: 18 mg/dL (ref 0–99)
Total CHOL/HDL Ratio: 4.8 RATIO
Triglycerides: 273 mg/dL — ABNORMAL HIGH (ref <150)
VLDL: 55 mg/dL — ABNORMAL HIGH (ref 0–40)

## 2018-11-17 LAB — URINE CULTURE: Culture: NO GROWTH

## 2018-11-17 LAB — VITAMIN B12: Vitamin B-12: 287 pg/mL (ref 180–914)

## 2018-11-17 LAB — FOLATE: Folate: 15.7 ng/mL (ref >5.9)

## 2018-11-17 LAB — FERRITIN: Ferritin: 22 ng/mL — ABNORMAL LOW (ref 24–336)

## 2018-11-17 MED ORDER — LORATADINE 10 MG PO TABS
10.0000 mg | ORAL_TABLET | Freq: Every day | ORAL | Status: DC
  Administered 2018-11-17 – 2018-11-22 (×5): 10 mg via ORAL
  Filled 2018-11-17 (×5): qty 1

## 2018-11-17 MED ORDER — UMECLIDINIUM BROMIDE 62.5 MCG/INH IN AEPB
1.0000 | INHALATION_SPRAY | Freq: Every day | RESPIRATORY_TRACT | Status: DC
  Administered 2018-11-21 – 2018-11-22 (×2): 1 via RESPIRATORY_TRACT
  Filled 2018-11-17 (×2): qty 7

## 2018-11-17 MED ORDER — MORPHINE SULFATE (PF) 4 MG/ML IV SOLN
6.0000 mg | INTRAVENOUS | Status: DC | PRN
  Administered 2018-11-17 (×2): 6 mg via INTRAVENOUS
  Filled 2018-11-17 (×2): qty 2

## 2018-11-17 MED ORDER — OXYCODONE HCL 5 MG PO TABS
20.0000 mg | ORAL_TABLET | Freq: Four times a day (QID) | ORAL | Status: DC | PRN
  Administered 2018-11-17 – 2018-11-19 (×8): 20 mg via ORAL
  Filled 2018-11-17 (×8): qty 4

## 2018-11-17 MED ORDER — MIRABEGRON ER 25 MG PO TB24
50.0000 mg | ORAL_TABLET | Freq: Every day | ORAL | Status: DC
  Administered 2018-11-17 – 2018-11-22 (×5): 50 mg via ORAL
  Filled 2018-11-17 (×5): qty 2

## 2018-11-17 MED ORDER — MAGNESIUM OXIDE 400 (241.3 MG) MG PO TABS
400.0000 mg | ORAL_TABLET | Freq: Two times a day (BID) | ORAL | Status: DC
  Administered 2018-11-17 – 2018-11-22 (×10): 400 mg via ORAL
  Filled 2018-11-17 (×10): qty 1

## 2018-11-17 MED ORDER — FLUTICASONE PROPIONATE 50 MCG/ACT NA SUSP
2.0000 | Freq: Every day | NASAL | Status: DC
  Administered 2018-11-17 – 2018-11-22 (×6): 2 via NASAL
  Filled 2018-11-17: qty 16

## 2018-11-17 MED ORDER — TAMSULOSIN HCL 0.4 MG PO CAPS
0.4000 mg | ORAL_CAPSULE | Freq: Every day | ORAL | Status: DC
  Administered 2018-11-17 – 2018-11-21 (×5): 0.4 mg via ORAL
  Filled 2018-11-17 (×5): qty 1

## 2018-11-17 MED ORDER — PREGABALIN 75 MG PO CAPS
150.0000 mg | ORAL_CAPSULE | Freq: Two times a day (BID) | ORAL | Status: DC
  Administered 2018-11-17 – 2018-11-22 (×10): 150 mg via ORAL
  Filled 2018-11-17 (×10): qty 2

## 2018-11-17 MED ORDER — HEPARIN SODIUM (PORCINE) 5000 UNIT/ML IJ SOLN
5000.0000 [IU] | Freq: Three times a day (TID) | INTRAMUSCULAR | Status: AC
  Administered 2018-11-17 – 2018-11-18 (×5): 5000 [IU] via SUBCUTANEOUS
  Filled 2018-11-17 (×5): qty 1

## 2018-11-17 MED ORDER — VITAMIN B-12 1000 MCG PO TABS
1000.0000 ug | ORAL_TABLET | Freq: Every day | ORAL | Status: DC
  Administered 2018-11-17 – 2018-11-22 (×5): 1000 ug via ORAL
  Filled 2018-11-17 (×5): qty 1

## 2018-11-17 NOTE — Progress Notes (Signed)
Pt requested for muscle relaxer. Notified on call MD. MD returned call @[phone]  and placed order for Baclofen 5mg .

## 2018-11-17 NOTE — Social Work (Signed)
CSW familiar with pt from prior admission, pt is LTC resident at [facility]. Following for recommendations.   Pt will need negative COVID within 48 hrs of discharge. Aware of current negative, likely will need repeat depending on hospital course.   Westley Hummer, MSW, [place] [employer] [phone]

## 2018-11-17 NOTE — Consult Note (Signed)
ORTHOPAEDIC CONSULTATION  REQUESTING PHYSICIAN: No att. providers found  Chief Complaint: Progressive dehiscence right below the knee amputation.  HPI: Gregory [name] is a 58 y.o. male who presents with lymphedema and venous insufficiency of the left leg with redness and swelling.  Patient states he has had progressive wound dehiscence of the right transtibial amputation.  Patient is manipulating the open wound with his hands.  Past Medical History:  Diagnosis Date  . Acquired absence of left great toe ([place])   . Acquired absence of right leg below knee ([place])   . Acute osteomyelitis of calcaneum, right ([place]) 10/02/2016  . Acute respiratory failure ([place])   . Amputation stump infection ([place]) 08/12/2018  . Anemia   . Basal cell carcinoma, eyelid   . Cancer ([place])   . Candidiasis 08/12/2018  . CHF (congestive heart failure) (HCC)    chronic diastolic   . Chronic back pain   . Chronic kidney disease    stage 3   . Chronic multifocal osteomyelitis (HCC)    of ankle and foot   . Chronic pain syndrome   . COPD (chronic obstructive pulmonary disease) ([place])   . DDD (degenerative disc disease), lumbar   . Depression    major depressive disorder   . Diabetes mellitus without complication ([place])    type 2   . Diabetic ulcer of toe of left foot ([place]) 10/02/2016  . Difficulty in walking, not elsewhere classified   . 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 Foot drop, bilateral   . Foot drop, right 12/27/2015  . GERD (gastroesophageal reflux disease)   . Herpesviral vesicular dermatitis   . Hyperlipidemia   . Hyperlipidemia   . Hypertension   . Insomnia   . Malignant melanoma of other parts of face ([place])   . MRSA bacteremia   . Muscle weakness (generalized)   . Nasal congestion   . Necrosis of toe ([place]) 07/08/2018  . Neuromuscular dysfunction of bladder   . Osteomyelitis ([place])   . Osteomyelitis ([place])   . Osteomyelitis ([place])    right BKA  . Other idiopathic peripheral autonomic neuropathy   .  Retention of urine, unspecified   . Squamous cell carcinoma of skin   . Unsteadiness on feet   . Urinary retention   . Urinary retention   . Urinary tract infection   . Wears dentures   . Wears glasses    Past Surgical History:  Procedure Laterality Date  . AMPUTATION Left 03/29/2017   Procedure: LEFT GREAT TOE AMPUTATION AT METATARSOPHALANGEAL JOINT;  Surgeon: [name], MD;  Location: [facility];  Service: Orthopedics;  Laterality: Left;  . AMPUTATION Right 03/29/2017   Procedure: RIGHT BELOW KNEE AMPUTATION;  Surgeon: [name], MD;  Location: [facility];  Service: Orthopedics;  Laterality: Right;  . AMPUTATION Left 06/06/2018   Procedure: LEFT 2ND TOE AMPUTATION;  Surgeon: [name], MD;  Location: [facility];  Service: Orthopedics;  Laterality: Left;  . ANTERIOR CERVICAL CORPECTOMY N/A 11/25/2015   Procedure: ANTERIOR CERVICAL FIVE CORPECTOMY Cervical four - six fusion;  Surgeon: Consuella Lose, MD;  Location: [facility];  Service: Neurosurgery;  Laterality: N/A;  ANTERIOR CERVICAL FIVE CORPECTOMY Cervical four - six fusion  . APPLICATION OF WOUND VAC Right 10/22/2018   Procedure: Application Of Wound Vac;  Surgeon: [name], MD;  Location: [facility];  Service: Orthopedics;  Laterality: Right;  . CYSTOSCOPY WITH RETROGRADE URETHROGRAM N/A 04/25/2018   Procedure: CYSTOSCOPY WITH RETROGRADE URETHROGRAM/  BALLOON DILATION;  Surgeon: Kathie Rhodes, MD;  Location: [facility];  Service: Urology;  Laterality: N/A;  . MULTIPLE TOOTH EXTRACTIONS    . POSTERIOR CERVICAL FUSION/FORAMINOTOMY N/A 11/29/2015   Procedure: Cervical Three-Cervical Seven Posterior Cervical Laminectomy with Fusion;  Surgeon: Consuella Lose, MD;  Location: [facility];  Service: Neurosurgery;  Laterality: N/A;  Cervical Three-Cervical Seven Posterior Cervical Laminectomy with Fusion  . STUMP REVISION Right 10/22/2018   Procedure: REVISION RIGHT BELOW KNEE AMPUTATION;  Surgeon: [name], MD;  Location: [facility];   Service: Orthopedics;  Laterality: Right;   Social History   Socioeconomic History  . Marital status: Single    Spouse name: Not on file  . Number of children: Not on file  . Years of education: Not on file  . Highest education level: Not on file  Occupational History  . Not on file  Social Needs  . Financial resource strain: Not on file  . Food insecurity:    Worry: Not on file    Inability: Not on file  . Transportation needs:    Medical: Not on file    Non-medical: Not on file  Tobacco Use  . Smoking status: Current Every Day Smoker    Packs/day: 0.50    Types: Cigarettes  . Smokeless tobacco: Never Used  Substance and Sexual Activity  . Alcohol use: No  . Drug use: No  . Sexual activity: Not on file  Lifestyle  . Physical activity:    Days per week: Not on file    Minutes per session: Not on file  . Stress: Not on file  Relationships  . Social connections:    Talks on phone: Not on file    Gets together: Not on file    Attends religious service: Not on file    Active member of club or organization: Not on file    Attends meetings of clubs or organizations: Not on file    Relationship status: Not on file  Other Topics Concern  . Not on file  Social History Narrative  . Not on file   Family History  Problem Relation Age of Onset  . Cancer Other   . Diabetes Other    - negative except otherwise stated in the family history section No Known Allergies Prior to Admission medications   Medication Sig Start Date End Date Taking? Authorizing Provider  acetaminophen (TYLENOL) 325 MG tablet Take 650 mg by mouth every 4 (four) hours as needed (general discomfort).    Yes [provider]  albuterol (PROVENTIL HFA;VENTOLIN HFA) 108 (90 Base) MCG/ACT inhaler Inhale 2 puffs into the lungs every 6 (six) hours as needed for wheezing or shortness of breath.   Yes [provider]  amLODipine (NORVASC) 10 MG tablet Take 10 mg by mouth daily. for HTN   Yes  [provider]  atorvastatin (LIPITOR) 20 MG tablet Take 20 mg by mouth at bedtime.   Yes [provider]  bisacodyl (DULCOLAX) 10 MG suppository Place 1 suppository (10 mg total) rectally daily as needed for moderate constipation. 12/02/15  Yes [name], MD  buPROPion (WELLBUTRIN XL) 300 MG 24 hr tablet Take 300 mg by mouth at bedtime.   Yes [provider]  calcium carbonate (TUMS - DOSED IN MG ELEMENTAL CALCIUM) 500 MG chewable tablet Chew 2 tablets by mouth every 8 (eight) hours as needed for indigestion or heartburn.   Yes [provider]  cetirizine (ZYRTEC) 10 MG tablet Take 10 mg  by mouth daily.   Yes [provider]  CETYLPYRIDINIUM CHLORIDE MT Use as directed 15 mLs in the mouth or throat every 4 (four) hours as needed (dry mouth).   Yes [provider]  Cholecalciferol (VITAMIN D-3) 5000 units TABS Take 5,000 Units by mouth daily.   Yes [provider]  eszopiclone (LUNESTA) 1 MG TABS tablet Take 1 mg by mouth at bedtime. Take immediately before bedtime   Yes [provider]  finasteride (PROSCAR) 5 MG tablet Take 5 mg by mouth daily.   Yes [provider]  furosemide (LASIX) 40 MG tablet Take 40 mg by mouth daily.   Yes [provider]  glipiZIDE (GLUCOTROL) 10 MG tablet Take 10 mg by mouth daily before breakfast.   Yes [provider]  glucagon (GLUCAGON EMERGENCY) 1 MG injection Inject 1 mg into the muscle once as needed (for CBG <70).    Yes [provider]  guaiFENesin (MUCINEX) 600 MG 12 hr tablet Take 600 mg by mouth every 12 (twelve) hours as needed (congestion).  04/23/18  Yes [provider]  insulin lispro (HUMALOG KWIKPEN) 100 UNIT/ML KwikPen Inject 0-10 Units into the skin See admin instructions. Inject 0-10 units per sliding scale 3 times daily before meals; CBG <70 = notify MD, 0-150 = 0 units, 151-200 = 2 units, 201-250 = 4 units, 251-300 = 6 units, 301-350 = 8  units, 351-400 = 10 units, >400 = notify MD   Yes [provider]  levofloxacin (LEVAQUIN) 750 MG tablet Take 1 tablet (750 mg total) by mouth daily. 11/11/18  Yes [name], MD  lidocaine (LIDODERM) 5 % Place 1 patch onto the skin daily. Apply to Right stump. Remove & Discard patch within 12 hours or as directed by MD   Yes [provider]  magnesium oxide (MAG-OX) 400 MG tablet Take 400 mg by mouth 2 (two) times daily.   Yes [provider]  metoprolol tartrate (LOPRESSOR) 50 MG tablet Take 50 mg by mouth daily.    Yes [provider]  mirabegron ER (MYRBETRIQ) 50 MG TB24 tablet Take 50 mg by mouth daily.   Yes [provider]  mometasone (NASONEX) 50 MCG/ACT nasal spray Place 2 sprays into the nose at bedtime.    Yes [provider]  morphine (MS CONTIN) 15 MG 12 hr tablet Take 1 tablet (15 mg total) by mouth every 12 (twelve) hours. 10/24/18  Yes Rayburn, Neta Mends, [name]  nicotine (NICODERM CQ - DOSED IN MG/24 HOURS) 14 mg/24hr patch Place 14 mg onto the skin daily.   Yes [provider]  omeprazole (PRILOSEC) 40 MG capsule Take 40 mg by mouth daily before breakfast.    Yes [provider]  ondansetron (ZOFRAN) 4 MG tablet Take 4 mg by mouth every 6 (six) hours as needed for nausea or vomiting.   Yes [provider]  oxybutynin (DITROPAN XL) 15 MG 24 hr tablet Take 15 mg by mouth at bedtime.   Yes [provider]  Oxycodone HCl 20 MG TABS Take 1 tablet (20 mg total) by mouth every 6 (six) hours as needed (pain). 10/24/18  Yes Rayburn, Neta Mends, [name]  PARoxetine (PAXIL) 20 MG tablet Take 20 mg by mouth at bedtime.   Yes [provider]  polyethylene glycol (MIRALAX / GLYCOLAX) packet Take 17 g by mouth daily as needed for mild constipation. 06/09/18  Yes Shelly Coss, MD  pregabalin (LYRICA) 150 MG capsule Take 1 capsule (  150 mg total) by mouth 2 (two) times daily. 10/24/18  Yes  Rayburn, Neta Mends, [name]  senna-docusate (SENOKOT-S) 8.6-50 MG tablet Take 2 tablets by mouth at bedtime.   Yes [provider]  silver sulfADIAZINE (SILVADENE) 1 % cream Apply 1 application topically See admin instructions. Apply to left toes every shift for wound care   Yes [provider]  tamsulosin (FLOMAX) 0.4 MG CAPS capsule Take 0.8 mg by mouth daily at 6 PM.  11/26/13  Yes [provider]  terbinafine (LAMISIL) 250 MG tablet Take 250 mg by mouth daily.   Yes [provider]  Tiotropium Bromide Monohydrate (SPIRIVA RESPIMAT) 1.25 MCG/ACT AERS Inhale 2 puffs into the lungs daily.   Yes [provider]  tiZANidine (ZANAFLEX) 4 MG tablet Take 4 mg by mouth every 8 (eight) hours as needed for muscle spasms.   Yes [provider]  vitamin B-12 (CYANOCOBALAMIN) 500 MCG tablet Take 1,000 mcg by mouth daily.    Yes [provider]   Dg Knee Complete 4 Views Right  Result Date: 11/16/2018 CLINICAL DATA:  Infection and pain at amputation site below the right knee. Reported fall. EXAM: RIGHT KNEE - COMPLETE 4+ VIEW COMPARISON:  10/20/2018 right lower extremity radiographs FINDINGS: Status post right lower extremity amputation at the level of the proximal tibial shaft. New soft tissue swelling throughout amputation site. New soft tissue defect overlying the tibial stump. Curvilinear lucency at the anterolateral margin of the right tibial stump suggests a nondisplaced fracture. No new osseous erosions or new periosteal reaction. No suspicious focal osseous lesions. No right knee joint effusion. No radiopaque foreign body. IMPRESSION: New curvilinear lucency at the anterolateral margin of the right tibial stump, suggesting a nondisplaced fracture. New soft tissue swelling throughout the amputation site. New soft tissue defect overlying the tibial stump. No specific radiographic findings of acute osteomyelitis. Electronically Signed   By: Ilona Sorrel M.D.   On: 11/16/2018 15:09   - pertinent xrays, CT, MRI studies were reviewed and independently interpreted  Positive ROS: All other systems have been reviewed and were otherwise negative with the exception of those mentioned in the HPI and as above.  Physical Exam: General: Alert, no acute distress Psychiatric: Patient is competent for consent with normal mood and affect Lymphatic: No axillary or cervical lymphadenopathy Cardiovascular: No pedal edema Respiratory: No cyanosis, no use of accessory musculature GI: No organomegaly, abdomen is soft and non-tender    Images:  @ENCIMAGES @  Labs:  Lab Results  Component Value Date   HGBA1C 7.7 (H) 11/17/2018   HGBA1C 8.0 (H) 06/04/2018   HGBA1C 7.9 (H) 04/25/2018   ESRSEDRATE 120 (H) 03/25/2017   ESRSEDRATE 84 (H) 10/02/2016   ESRSEDRATE 132 (H) 12/01/2015   CRP 4.9 (H) 03/25/2017   CRP 30.6 (H) 10/02/2016   CRP 13.7 (H) 11/11/2015   REPTSTATUS PENDING 11/16/2018   GRAMSTAIN  06/06/2018    RARE WBC PRESENT, PREDOMINANTLY PMN FEW GRAM POSITIVE COCCI    CULT  11/16/2018    NO GROWTH < 24 HOURS Performed at [facility], [place] [address] [place], [place] [address]    LABORGA PROVIDENCIA [name] (A) 01/12/2017    Lab Results  Component Value Date   ALBUMIN 3.3 (L) 10/22/2018   ALBUMIN 2.2 (L) 06/04/2018   ALBUMIN 2.8 (L) 06/03/2018   PREALBUMIN 23.3 03/25/2017    Neurologic: Patient does not have protective sensation bilateral lower extremities.   MUSCULOSKELETAL:   Skin: Examination patient has  had progressive dehiscence of the open wound.  It is approximately 3 x 6 cm and probes to bone.  The residual limb is extremely short and insufficient bone to preserve a revision transtibial amputation on the right.  Examination the left leg there is lymphedema and venous swelling there are multiple healed ulcers no active drainage at this time.  Patient has uncontrolled type 2 diabetes with protein caloric  malnutrition.  Assessment: Assessment: Diabetic insensate neuropathy with peripheral vascular disease with lymphedema and venous insufficiency of the left leg with wound dehiscence and osteomyelitis of the right transtibial amputation.  Plan: Plan: Discussed with the patient the previous recommendations of proceeding with an above-the-knee amputation on the right.  Patient has progressive infection and wound dehiscence this has not resolved with oral antibiotics.  We will plan for compression wrap for the left lower extremity.  Will proceed with right above-the-knee amputation on Wednesday.  Thank you for the consult and the opportunity to see Gregory [name], [place] [phone] 7:26 AM

## 2018-11-17 NOTE — Progress Notes (Signed)
Patient refuses echo

## 2018-11-17 NOTE — Progress Notes (Signed)
Gave report to [place], RN for direct patient care.

## 2018-11-17 NOTE — Progress Notes (Signed)
The patient requested to call [facility] in regards to him "wanting IV dilaudid"  The MD has been made aware of the patient's request.

## 2018-11-17 NOTE — H&P (View-Only) (Signed)
ORTHOPAEDIC CONSULTATION  REQUESTING PHYSICIAN: No att. providers found  Chief Complaint: Progressive dehiscence right below the knee amputation.  HPI: Gregory [name] is a 58 y.o. male who presents with lymphedema and venous insufficiency of the left leg with redness and swelling.  Patient states he has had progressive wound dehiscence of the right transtibial amputation.  Patient is manipulating the open wound with his hands.  Past Medical History:  Diagnosis Date  . Acquired absence of left great toe ([place])   . Acquired absence of right leg below knee ([place])   . Acute osteomyelitis of calcaneum, right ([place]) 10/02/2016  . Acute respiratory failure ([place])   . Amputation stump infection ([place]) 08/12/2018  . Anemia   . Basal cell carcinoma, eyelid   . Cancer ([place])   . Candidiasis 08/12/2018  . CHF (congestive heart failure) (HCC)    chronic diastolic   . Chronic back pain   . Chronic kidney disease    stage 3   . Chronic multifocal osteomyelitis (HCC)    of ankle and foot   . Chronic pain syndrome   . COPD (chronic obstructive pulmonary disease) ([place])   . DDD (degenerative disc disease), lumbar   . Depression    major depressive disorder   . Diabetes mellitus without complication ([place])    type 2   . Diabetic ulcer of toe of left foot ([place]) 10/02/2016  . Difficulty in walking, not elsewhere classified   . 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 Foot drop, bilateral   . Foot drop, right 12/27/2015  . GERD (gastroesophageal reflux disease)   . Herpesviral vesicular dermatitis   . Hyperlipidemia   . Hyperlipidemia   . Hypertension   . Insomnia   . Malignant melanoma of other parts of face ([place])   . MRSA bacteremia   . Muscle weakness (generalized)   . Nasal congestion   . Necrosis of toe ([place]) 07/08/2018  . Neuromuscular dysfunction of bladder   . Osteomyelitis ([place])   . Osteomyelitis ([place])   . Osteomyelitis ([place])    right BKA  . Other idiopathic peripheral autonomic neuropathy   .  Retention of urine, unspecified   . Squamous cell carcinoma of skin   . Unsteadiness on feet   . Urinary retention   . Urinary retention   . Urinary tract infection   . Wears dentures   . Wears glasses    Past Surgical History:  Procedure Laterality Date  . AMPUTATION Left 03/29/2017   Procedure: LEFT GREAT TOE AMPUTATION AT METATARSOPHALANGEAL JOINT;  Surgeon: [name], MD;  Location: [facility];  Service: Orthopedics;  Laterality: Left;  . AMPUTATION Right 03/29/2017   Procedure: RIGHT BELOW KNEE AMPUTATION;  Surgeon: [name], MD;  Location: [facility];  Service: Orthopedics;  Laterality: Right;  . AMPUTATION Left 06/06/2018   Procedure: LEFT 2ND TOE AMPUTATION;  Surgeon: [name], MD;  Location: [facility];  Service: Orthopedics;  Laterality: Left;  . ANTERIOR CERVICAL CORPECTOMY N/A 11/25/2015   Procedure: ANTERIOR CERVICAL FIVE CORPECTOMY Cervical four - six fusion;  Surgeon: Consuella Lose, MD;  Location: [facility];  Service: Neurosurgery;  Laterality: N/A;  ANTERIOR CERVICAL FIVE CORPECTOMY Cervical four - six fusion  . APPLICATION OF WOUND VAC Right 10/22/2018   Procedure: Application Of Wound Vac;  Surgeon: [name], MD;  Location: [facility];  Service: Orthopedics;  Laterality: Right;  . CYSTOSCOPY WITH RETROGRADE URETHROGRAM N/A 04/25/2018   Procedure: CYSTOSCOPY WITH RETROGRADE URETHROGRAM/  BALLOON DILATION;  Surgeon: Kathie Rhodes, MD;  Location[facility];  Service: Urology;  Laterality: N/A;  . MULTIPLE TOOTH EXTRACTIONS    . POSTERIOR CERVICAL FUSION/FORAMINOTOMY N/A 11/29/2015   Procedure: Cervical Three-Cervical Seven Posterior Cervical Laminectomy with Fusion;  Surgeon: Consuella Lose, MD;  Location: [facility];  Service: Neurosurgery;  Laterality: N/A;  Cervical Three-Cervical Seven Posterior Cervical Laminectomy with Fusion  . STUMP REVISION Right 10/22/2018   Procedure: REVISION RIGHT BELOW KNEE AMPUTATION;  Surgeon: [name], MD;  Location: [facility];   Service: Orthopedics;  Laterality: Right;   Social History   Socioeconomic History  . Marital status: Single    Spouse name: Not on file  . Number of children: Not on file  . Years of education: Not on file  . Highest education level: Not on file  Occupational History  . Not on file  Social Needs  . Financial resource strain: Not on file  . Food insecurity:    Worry: Not on file    Inability: Not on file  . Transportation needs:    Medical: Not on file    Non-medical: Not on file  Tobacco Use  . Smoking status: Current Every Day Smoker    Packs/day: 0.50    Types: Cigarettes  . Smokeless tobacco: Never Used  Substance and Sexual Activity  . Alcohol use: No  . Drug use: No  . Sexual activity: Not on file  Lifestyle  . Physical activity:    Days per week: Not on file    Minutes per session: Not on file  . Stress: Not on file  Relationships  . Social connections:    Talks on phone: Not on file    Gets together: Not on file    Attends religious service: Not on file    Active member of club or organization: Not on file    Attends meetings of clubs or organizations: Not on file    Relationship status: Not on file  Other Topics Concern  . Not on file  Social History Narrative  . Not on file   Family History  Problem Relation Age of Onset  . Cancer Other   . Diabetes Other    - negative except otherwise stated in the family history section No Known Allergies Prior to Admission medications   Medication Sig Start Date End Date Taking? Authorizing Provider  acetaminophen (TYLENOL) 325 MG tablet Take 650 mg by mouth every 4 (four) hours as needed (general discomfort).    Yes [provider]  albuterol (PROVENTIL HFA;VENTOLIN HFA) 108 (90 Base) MCG/ACT inhaler Inhale 2 puffs into the lungs every 6 (six) hours as needed for wheezing or shortness of breath.   Yes [provider]  amLODipine (NORVASC) 10 MG tablet Take 10 mg by mouth daily. for HTN   Yes  [provider]  atorvastatin (LIPITOR) 20 MG tablet Take 20 mg by mouth at bedtime.   Yes [provider]  bisacodyl (DULCOLAX) 10 MG suppository Place 1 suppository (10 mg total) rectally daily as needed for moderate constipation. 12/02/15  Yes [name], MD  buPROPion (WELLBUTRIN XL) 300 MG 24 hr tablet Take 300 mg by mouth at bedtime.   Yes [provider]  calcium carbonate (TUMS - DOSED IN MG ELEMENTAL CALCIUM) 500 MG chewable tablet Chew 2 tablets by mouth every 8 (eight) hours as needed for indigestion or heartburn.   Yes [provider]  cetirizine (ZYRTEC) 10 MG tablet Take 10 mg  by mouth daily.   Yes [provider]  CETYLPYRIDINIUM CHLORIDE MT Use as directed 15 mLs in the mouth or throat every 4 (four) hours as needed (dry mouth).   Yes [provider]  Cholecalciferol (VITAMIN D-3) 5000 units TABS Take 5,000 Units by mouth daily.   Yes [provider]  eszopiclone (LUNESTA) 1 MG TABS tablet Take 1 mg by mouth at bedtime. Take immediately before bedtime   Yes [provider]  finasteride (PROSCAR) 5 MG tablet Take 5 mg by mouth daily.   Yes [provider]  furosemide (LASIX) 40 MG tablet Take 40 mg by mouth daily.   Yes [provider]  glipiZIDE (GLUCOTROL) 10 MG tablet Take 10 mg by mouth daily before breakfast.   Yes [provider]  glucagon (GLUCAGON EMERGENCY) 1 MG injection Inject 1 mg into the muscle once as needed (for CBG <70).    Yes [provider]  guaiFENesin (MUCINEX) 600 MG 12 hr tablet Take 600 mg by mouth every 12 (twelve) hours as needed (congestion).  04/23/18  Yes [provider]  insulin lispro (HUMALOG KWIKPEN) 100 UNIT/ML KwikPen Inject 0-10 Units into the skin See admin instructions. Inject 0-10 units per sliding scale 3 times daily before meals; CBG <70 = notify MD, 0-150 = 0 units, 151-200 = 2 units, 201-250 = 4 units, 251-300 = 6 units, 301-350 = 8  units, 351-400 = 10 units, >400 = notify MD   Yes [provider]  levofloxacin (LEVAQUIN) 750 MG tablet Take 1 tablet (750 mg total) by mouth daily. 11/11/18  Yes [name], MD  lidocaine (LIDODERM) 5 % Place 1 patch onto the skin daily. Apply to Right stump. Remove & Discard patch within 12 hours or as directed by MD   Yes [provider]  magnesium oxide (MAG-OX) 400 MG tablet Take 400 mg by mouth 2 (two) times daily.   Yes [provider]  metoprolol tartrate (LOPRESSOR) 50 MG tablet Take 50 mg by mouth daily.    Yes [provider]  mirabegron ER (MYRBETRIQ) 50 MG TB24 tablet Take 50 mg by mouth daily.   Yes [provider]  mometasone (NASONEX) 50 MCG/ACT nasal spray Place 2 sprays into the nose at bedtime.    Yes [provider]  morphine (MS CONTIN) 15 MG 12 hr tablet Take 1 tablet (15 mg total) by mouth every 12 (twelve) hours. 10/24/18  Yes Rayburn, Neta Mends, [name]  nicotine (NICODERM CQ - DOSED IN MG/24 HOURS) 14 mg/24hr patch Place 14 mg onto the skin daily.   Yes [provider]  omeprazole (PRILOSEC) 40 MG capsule Take 40 mg by mouth daily before breakfast.    Yes [provider]  ondansetron (ZOFRAN) 4 MG tablet Take 4 mg by mouth every 6 (six) hours as needed for nausea or vomiting.   Yes [provider]  oxybutynin (DITROPAN XL) 15 MG 24 hr tablet Take 15 mg by mouth at bedtime.   Yes [provider]  Oxycodone HCl 20 MG TABS Take 1 tablet (20 mg total) by mouth every 6 (six) hours as needed (pain). 10/24/18  Yes Rayburn, Neta Mends, [name]  PARoxetine (PAXIL) 20 MG tablet Take 20 mg by mouth at bedtime.   Yes [provider]  polyethylene glycol (MIRALAX / GLYCOLAX) packet Take 17 g by mouth daily as needed for mild constipation. 06/09/18  Yes Shelly Coss, MD  pregabalin (LYRICA) 150 MG capsule Take 1 capsule (  150 mg total) by mouth 2 (two) times daily. 10/24/18  Yes  Rayburn, Neta Mends, [name]  senna-docusate (SENOKOT-S) 8.6-50 MG tablet Take 2 tablets by mouth at bedtime.   Yes [provider]  silver sulfADIAZINE (SILVADENE) 1 % cream Apply 1 application topically See admin instructions. Apply to left toes every shift for wound care   Yes [provider]  tamsulosin (FLOMAX) 0.4 MG CAPS capsule Take 0.8 mg by mouth daily at 6 PM.  11/26/13  Yes [provider]  terbinafine (LAMISIL) 250 MG tablet Take 250 mg by mouth daily.   Yes [provider]  Tiotropium Bromide Monohydrate (SPIRIVA RESPIMAT) 1.25 MCG/ACT AERS Inhale 2 puffs into the lungs daily.   Yes [provider]  tiZANidine (ZANAFLEX) 4 MG tablet Take 4 mg by mouth every 8 (eight) hours as needed for muscle spasms.   Yes [provider]  vitamin B-12 (CYANOCOBALAMIN) 500 MCG tablet Take 1,000 mcg by mouth daily.    Yes [provider]   Dg Knee Complete 4 Views Right  Result Date: 11/16/2018 CLINICAL DATA:  Infection and pain at amputation site below the right knee. Reported fall. EXAM: RIGHT KNEE - COMPLETE 4+ VIEW COMPARISON:  10/20/2018 right lower extremity radiographs FINDINGS: Status post right lower extremity amputation at the level of the proximal tibial shaft. New soft tissue swelling throughout amputation site. New soft tissue defect overlying the tibial stump. Curvilinear lucency at the anterolateral margin of the right tibial stump suggests a nondisplaced fracture. No new osseous erosions or new periosteal reaction. No suspicious focal osseous lesions. No right knee joint effusion. No radiopaque foreign body. IMPRESSION: New curvilinear lucency at the anterolateral margin of the right tibial stump, suggesting a nondisplaced fracture. New soft tissue swelling throughout the amputation site. New soft tissue defect overlying the tibial stump. No specific radiographic findings of acute osteomyelitis. Electronically Signed   By: Ilona Sorrel M.D.   On: 11/16/2018 15:09   - pertinent xrays, CT, MRI studies were reviewed and independently interpreted  Positive ROS: All other systems have been reviewed and were otherwise negative with the exception of those mentioned in the HPI and as above.  Physical Exam: General: Alert, no acute distress Psychiatric: Patient is competent for consent with normal mood and affect Lymphatic: No axillary or cervical lymphadenopathy Cardiovascular: No pedal edema Respiratory: No cyanosis, no use of accessory musculature GI: No organomegaly, abdomen is soft and non-tender    Images:  @ENCIMAGES @  Labs:  Lab Results  Component Value Date   HGBA1C 7.7 (H) 11/17/2018   HGBA1C 8.0 (H) 06/04/2018   HGBA1C 7.9 (H) 04/25/2018   ESRSEDRATE 120 (H) 03/25/2017   ESRSEDRATE 84 (H) 10/02/2016   ESRSEDRATE 132 (H) 12/01/2015   CRP 4.9 (H) 03/25/2017   CRP 30.6 (H) 10/02/2016   CRP 13.7 (H) 11/11/2015   REPTSTATUS PENDING 11/16/2018   GRAMSTAIN  06/06/2018    RARE WBC PRESENT, PREDOMINANTLY PMN FEW GRAM POSITIVE COCCI    CULT  11/16/2018    NO GROWTH < 24 HOURS Performed at [facility], [place] [address] [place], [place] [address]    LABORGA PROVIDENCIA [name] (A) 01/12/2017    Lab Results  Component Value Date   ALBUMIN 3.3 (L) 10/22/2018   ALBUMIN 2.2 (L) 06/04/2018   ALBUMIN 2.8 (L) 06/03/2018   PREALBUMIN 23.3 03/25/2017    Neurologic: Patient does not have protective sensation bilateral lower extremities.   MUSCULOSKELETAL:   Skin: Examination patient has  had progressive dehiscence of the open wound.  It is approximately 3 x 6 cm and probes to bone.  The residual limb is extremely short and insufficient bone to preserve a revision transtibial amputation on the right.  Examination the left leg there is lymphedema and venous swelling there are multiple healed ulcers no active drainage at this time.  Patient has uncontrolled type 2 diabetes with protein caloric  malnutrition.  Assessment: Assessment: Diabetic insensate neuropathy with peripheral vascular disease with lymphedema and venous insufficiency of the left leg with wound dehiscence and osteomyelitis of the right transtibial amputation.  Plan: Plan: Discussed with the patient the previous recommendations of proceeding with an above-the-knee amputation on the right.  Patient has progressive infection and wound dehiscence this has not resolved with oral antibiotics.  We will plan for compression wrap for the left lower extremity.  Will proceed with right above-the-knee amputation on Wednesday.  Thank you for the consult and the opportunity to see Mr. Delma Villalva, [place] [phone] 7:26 AM

## 2018-11-17 NOTE — Progress Notes (Signed)
I was called to the patient's room and the patient stated " that Doctor that just came in here, said to tell the nurse that I can have morphine"  I informed the patient that no order has been written to given him morphine at this time and I cant talk orders from the patient, just because the Doctor told him that.  [facility] has been sent a text page

## 2018-11-17 NOTE — Progress Notes (Signed)
The patient  Request pain medication often.  The patient was sleeping soundly when nurse came into the room to given PRN that he requested.

## 2018-11-17 NOTE — Consult Note (Signed)
[facility] Nurse wound consult note Patient receiving care in [facility]. At the current time, the FYIS box contains the following statement:  "POSSIBLE NOVEL CORONAVIRUS (2019-NCOV)". Reason for Consult: Profore application to left lower leg Wound type: Patient states there are no wounds under the dressing on the left foot. Patient requested to see the layers of the Profore wrap.  These were shown to the patient. Patient refused application of the wrap.  He stated, "[name] knows I don't like those.  I'll get one of the socks he told me about".  Application of Profore canceled. Thank you for the consult.  Discussed plan of care with the patient and bedside nurse.  [facility] nurse will not follow at this time.  Please re-consult the [facility] team if needed.  Val Riles, RN, MSN, CWOCN, CNS-BC, pager [phone]

## 2018-11-17 NOTE — Progress Notes (Signed)
The patient refused the echocardiogram that was ordered.

## 2018-11-17 NOTE — Progress Notes (Signed)
Family Medicine Teaching Service Daily Progress Note Intern Pager: [phone]  Patient name: Gregory [name] Medical record number: [id number] Date of birth: 14-Feb-1961 Age: 58 y.o. Gender: male  Primary Care Provider: [name], MD Consultants: Gregory [name] Code Status: Full  Pt Overview and Major Events to Date:  5/31-admission for infected surgical site of a right BKA amputation  Assessment and Plan: Gregory [name] is a 58 y.o. male presenting with infection at wound site from [facility] from [name] May 6.. PMH is significant for HFpEF, diabetes, peripheral neuropathy, edema with stasis changes, history of multiple amputations with revisions, urinary difficulty, chronic pain, COPD, hypertension, hyperlipidemia, blindness in left eye, history of cancer.  Surgical site infection Vitals stable overnight.  X-ray of the right knee shows shot soft tissue swelling without evidence of acute osteomyelitis.  Patient reports that [name] saw him this morning and is planning to perform surgery on 6/3. -Orthopedics aware, will see patient in 6/1 -Continue vancomycin and Zosyn per Ortho request -Heparin per pharmacy -Pain control as below  Diabetes, type II A1c 7.7.  Holding home medications.  CBGs 107-185 in the past 24 hours.  No insulin given in the past 24 hours. -Insulin sliding scale, sensitive  Nodule under right eye -Continue to monitor -Outpatient follow-up  History of HFpEF, stable -Continue metoprolol and Lasix -Follow-up echo  Hypertension Blood pressures normotensive from admission. -Amlodipine 10 mg -Furosemide 40 mg daily -Metoprolol tartrate 25 mg twice daily  Chronic pain -Tylenol 650 every 6 hours -Baclofen 5 mg 3 times daily as needed -MS Contin 15 mg every 12 hours -Oxycodone IR 20 mg every 6 hours as needed -Pregabalin 75 mg twice daily  COPD, stable -Dulera  Urinary retention -Lasix -Oxybutynin -Finasteride  Hyperlipidemia Total cholesterol 92, HDL 19, LDL  18 -Atorvastatin 20 mg daily  Depression -Paroxetine 20 mg daily -Wellbutrin daily  Tobacco use -Nicotine patch 7 mg daily  FEN/GI: NPO PPx: heparin  Disposition: Pending surgery on 6/3.  Subjective:  No acute events overnight.  Gregory [name] feels mildly improved this morning.  He continues to report significant pain around his surgical site.  He reports that he is seen Dr. due to this morning who noted that he will be having a surgery on 6/3.  Gregory [name] would like [name] pain control.  Objective: Temp:  [98.4 F (36.9 C)-98.7 F (37.1 C)] 98.4 F (36.9 C) (06/01 0402) Pulse Rate:  [66-72] 69 (06/01 0402) Resp:  [14-18] 18 (06/01 0402) BP: (122-146)/(58-81) 129/74 (06/01 0402) SpO2:  [92 %-97 %] 97 % (06/01 0402) Weight:  [176 kg] 112 kg (06/01 0304) Physical Exam: General: Alert and cooperative and appears to be in no acute distress.  Sitting in bed eating breakfast comfortably no acute distress. Cardio: Normal S1 and S2, no S3 or S4. Rhythm is regular. No murmurs or rubs.   Pulm: Clear to auscultation bilaterally, no crackles, wheezing, or diminished breath sounds. Normal respiratory effort Abdomen: Bowel sounds normal. Abdomen soft and non-tender.  Extremities: Surgical site on right lower limb test.  Significantly erythematous with purulent discharge exuding from dehisced wound from a below the knee amputation. Neuro: Cranial nerves grossly intact   Laboratory: Recent Labs  Lab 11/16/18 1356 11/17/18 0406  WBC 7.9 7.2  HGB 9.3* 9.3*  HCT 31.3* 31.1*  PLT 241 208   Recent Labs  Lab 11/16/18 1356 11/17/18 0406  NA 136 136  K 4.4 4.0  CL 99 99  CO2 27 27  BUN 21*  23*  CREATININE 1.79* 1.80*  CALCIUM 9.0 8.6*  GLUCOSE 107* 185*    Imaging/Diagnostic Tests: Dg Knee Complete 4 Views Right  Result Date: 11/16/2018 CLINICAL DATA:  Infection and pain at amputation site below the right knee. Reported fall. EXAM: RIGHT KNEE - COMPLETE 4+ VIEW COMPARISON:   10/20/2018 right lower extremity radiographs FINDINGS: Status post right lower extremity amputation at the level of the proximal tibial shaft. New soft tissue swelling throughout amputation site. New soft tissue defect overlying the tibial stump. Curvilinear lucency at the anterolateral margin of the right tibial stump suggests a nondisplaced fracture. No new osseous erosions or new periosteal reaction. No suspicious focal osseous lesions. No right knee joint effusion. No radiopaque foreign body. IMPRESSION: New curvilinear lucency at the anterolateral margin of the right tibial stump, suggesting a nondisplaced fracture. New soft tissue swelling throughout the amputation site. New soft tissue defect overlying the tibial stump. No specific radiographic findings of acute osteomyelitis. Electronically Signed   By: Ilona Sorrel M.D.   On: 11/16/2018 15:09    Matilde Haymaker, MD 11/17/2018, 6:32 AM PGY-1, [facility] Intern pager: [phone], text pages welcome

## 2018-11-17 NOTE — Consult Note (Signed)
[facility] Nurse wound consult note I have asked the unit secretary to order a Profore wrap for the LLE. Kellie Simmering #[id number] Val Riles, RN, MSN, CWOCN, CNS-BC, pager [phone]

## 2018-11-17 NOTE — Progress Notes (Signed)
Patient ID: Gregory [name], male   DOB: 1960-11-09, 58 y.o.   MRN: [id number] [name] ia aware of this patient and will see him in early AM and likely proceed with surgery.

## 2018-11-18 ENCOUNTER — Inpatient Hospital Stay (HOSPITAL_COMMUNITY): Payer: Medicaid Other

## 2018-11-18 ENCOUNTER — Ambulatory Visit: Payer: Medicaid Other | Admitting: Orthopedic Surgery

## 2018-11-18 DIAGNOSIS — M7989 Other specified soft tissue disorders: Secondary | ICD-10-CM

## 2018-11-18 DIAGNOSIS — R609 Edema, unspecified: Secondary | ICD-10-CM

## 2018-11-18 LAB — BASIC METABOLIC PANEL
Anion gap: 15 (ref 5–15)
BUN: 21 mg/dL — ABNORMAL HIGH (ref 6–20)
CO2: 25 mmol/L (ref 22–32)
Calcium: 8.3 mg/dL — ABNORMAL LOW (ref 8.9–10.3)
Chloride: 99 mmol/L (ref 98–111)
Creatinine, Ser: 1.79 mg/dL — ABNORMAL HIGH (ref 0.61–1.24)
GFR calc Af Amer: 47 mL/min — ABNORMAL LOW (ref >60)
GFR calc non Af Amer: 41 mL/min — ABNORMAL LOW (ref >60)
Glucose, Bld: 173 mg/dL — ABNORMAL HIGH (ref 70–99)
Potassium: 4 mmol/L (ref 3.5–5.1)
Sodium: 139 mmol/L (ref 135–145)

## 2018-11-18 LAB — CBC
HCT: 28.1 % — ABNORMAL LOW (ref 39.0–52.0)
Hemoglobin: 8.4 g/dL — ABNORMAL LOW (ref 13.0–17.0)
MCH: 24.9 pg — ABNORMAL LOW (ref 26.0–34.0)
MCHC: 29.9 g/dL — ABNORMAL LOW (ref 30.0–36.0)
MCV: 83.4 fL (ref 80.0–100.0)
Platelets: 188 10*3/uL (ref 150–400)
RBC: 3.37 MIL/uL — ABNORMAL LOW (ref 4.22–5.81)
RDW: 17.4 % — ABNORMAL HIGH (ref 11.5–15.5)
WBC: 5.5 10*3/uL (ref 4.0–10.5)
nRBC: 0 % (ref 0.0–0.2)

## 2018-11-18 LAB — GLUCOSE, CAPILLARY
Glucose-Capillary: 155 mg/dL — ABNORMAL HIGH (ref 70–99)
Glucose-Capillary: 184 mg/dL — ABNORMAL HIGH (ref 70–99)
Glucose-Capillary: 201 mg/dL — ABNORMAL HIGH (ref 70–99)
Glucose-Capillary: 177 mg/dL — ABNORMAL HIGH (ref 70–99)

## 2018-11-18 MED ORDER — FUROSEMIDE 40 MG PO TABS
40.0000 mg | ORAL_TABLET | Freq: Once | ORAL | Status: AC
  Administered 2018-11-18: 40 mg via ORAL

## 2018-11-18 MED ORDER — FERROUS SULFATE 325 (65 FE) MG PO TABS
325.0000 mg | ORAL_TABLET | Freq: Every day | ORAL | Status: DC
  Administered 2018-11-18 – 2018-11-22 (×4): 325 mg via ORAL
  Filled 2018-11-18 (×4): qty 1

## 2018-11-18 MED ORDER — POVIDONE-IODINE 10 % EX SWAB: 2.0000 "application " | Freq: Once | CUTANEOUS | Status: DC

## 2018-11-18 MED ORDER — CHLORHEXIDINE GLUCONATE 4 % EX LIQD
60.0000 mL | Freq: Once | CUTANEOUS | Status: DC
  Filled 2018-11-18: qty 60

## 2018-11-18 NOTE — Progress Notes (Signed)
Lower extremity venous has been completed.   Preliminary results in CV Proc.   Abram Sander 11/18/2018 10:32 AM

## 2018-11-18 NOTE — Progress Notes (Addendum)
FPTS Interim Progress Note  S: Received page from nurse concerning patient demanding pain meds.  Called patient to discuss his pain and he notes worsening left lower extremity swelling, stating that "it has become so tight."  Patient not due for next oxy for another 40 minutes. Per chart review, patient had left lower extremity edema noted this morning.  Lower extremity Doppler negative for DVT.  Received 40 mg of p.o. Lasix with 1.5 L urine output throughout the day. Went to see patient.  Lower extremity examined.  Patient notes he takes 80 mg of Lasix a day which keeps his swelling controlled.  Patient also endorsed that no one has looked at his left toe amputation since he arrived.  This was inspected and picture saved to media for documentation.   O: BP (!) 145/56 (BP Location: Left Arm)   Pulse 77   Temp 98.1 F (36.7 C) (Oral)   Resp 18   Ht 6' (1.829 m)   Wt 112 kg   SpO2 97%   BMI 33.50 kg/m   Gen: sitting up comfortably at bedside chair with legs propped up, conversing easily Extremity: left LE with 1+ pitting edema to thigh, some erythema along anterior LE but no increased warmth, NTTP. Left toe amputation well appearing. Some crusting, but no active drainage or bleeding and no signs of active infection      A/P: - give another 40mg  PO lasix - monitor I/O's, daily weights - give home Oxy 20mg  now, then as scheduled  Danna Hefty, DO 11/18/2018, 9:21 PM PGY-1, [facility] Medicine Service pager [phone]

## 2018-11-18 NOTE — Progress Notes (Signed)
Family Medicine Teaching Service Daily Progress Note Intern Pager: [phone]  Patient name: Gregory [name] Medical record number: [id number] Date of birth: August 27, 1960 Age: 58 y.o. 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 male presenting with infection at wound site from [place] from [name] May 6.. PMH is significant for HFpEF, diabetes, peripheral neuropathy, edema with stasis changes, history of multiple amputations with revisions, urinary difficulty, chronic pain, COPD, hypertension, hyperlipidemia, blindness in left eye, history of cancer.  Surgical site infection with dehiscence [name] with orthopedics has assessed and is recommending above-the-knee amputation on 6/3. -Orthopedics planning surgery on 6/3 -N.p.o. at midnight -Continue vancomycin and Zosyn per Ortho request -Heparin per pharmacy -Pain control as below  Anemia, iron deficiency Iron 23, ferritin 22, iron saturation 9.  Hemoglobin this morning 8.4. -Ferrous sulfate 325 daily  Edema of the left leg He reports noting edema of his left leg following his left toe surgery in 05/2018.  He thinks that this is worsened in the past couple of days.  In the past, this is improved with Lasix.  He denies any pain in his lower extremity, denies chest pain, denies palpitations.  On 6/1 he denied a wrapping for his leg.  He denies any history of DVTs. -LE doppler of left LE -Consider p.o. Lasix for lower extremity swelling  Diabetes, type II A1c 7.7.  Holding home medications.  CBGs 153-218 in the past 24 hours.  7 units insulin given in the past 24 hours. -Insulin sliding scale, sensitive  Nodule under right eye -Continue to monitor -Outpatient follow-up  History of HFpEF, stable -Continue metoprolol and  Lasix -Follow-up echo  Hypertension Blood pressures normotensive from admission. -Amlodipine 10 mg -Furosemide 40 mg daily -Metoprolol tartrate 25 mg twice daily  Chronic pain -Tylenol 650 every 6 hours -Baclofen 5 mg 3 times daily as needed -MS Contin 15 mg every 12 hours -Oxycodone IR 20 mg every 6 hours as needed -Pregabalin 75 mg twice daily  COPD, stable -Dulera -Incruse Ellipta  Urinary retention -Lasix -Oxybutynin -Finasteride -Flomax  Hyperlipidemia Total cholesterol 92, HDL 19, LDL 18 -Atorvastatin 20 mg daily  Depression -Paroxetine 20 mg daily -Wellbutrin daily  Tobacco use -Nicotine patch 7 mg daily  Allergies -Flonase -Loratadine  FEN/GI: NPO PPx: heparin  Disposition: Pending surgery on 6/3.  Subjective:  No acute events overnight.  He reported worse pain control with IV morphine and requests being put back on his p.o. oxycodone.  Is no new complaints this morning but he did note increased swelling of his last left leg the past several days.  Pacifically denied chest pain, palpitations, pain in his leg.  Objective: Temp:  [97.7 F (36.5 C)-98.1 F (36.7 C)] 98.1 F (36.7 C) (06/02 0546) Pulse Rate:  [57-72] 57 (06/02 0546) Resp:  [16-18] 16 (06/02 0546) BP: (121-147)/(61-74) 147/61 (06/02 0546) SpO2:  [91 %-93 %] 93 % (06/02 2585)  Physical Exam: General: Alert and cooperative and appears to be in no acute distress.  Resting in bed comfortably eating breakfast.  Able to move around without issue. HEENT: Neck non-tender without lymphadenopathy, masses or thyromegaly Cardio: Normal S1 and S2, no S3 or S4. Rhythm is regular. No murmurs or rubs.   Pulm: Clear to auscultation bilaterally, no crackles, wheezing, or diminished breath  sounds. Normal respiratory effort Abdomen: Bowel sounds normal. Abdomen soft and non-tender.  Extremities: LE edema of the left leg up to the mid thigh.  Skin changes consistent with vascular disease.  Left foot  remains wrapped. Neuro: Cranial nerves grossly intact    Laboratory: Recent Labs  Lab 11/16/18 1356 11/17/18 0406 11/18/18 0502  WBC 7.9 7.2 5.5  HGB 9.3* 9.3* 8.4*  HCT 31.3* 31.1* 28.1*  PLT 241 208 188   Recent Labs  Lab 11/16/18 1356 11/17/18 0406 11/18/18 0502  NA 136 136 139  K 4.4 4.0 4.0  CL 99 99 99  CO2 27 27 25   BUN 21* 23* 21*  CREATININE 1.79* 1.80* 1.79*  CALCIUM 9.0 8.6* 8.3*  GLUCOSE 107* 185* 173*    Imaging/Diagnostic Tests: No results found.  Matilde Haymaker, MD 11/18/2018, 6:31 AM PGY-1, [place] Intern pager: [phone], text pages welcome

## 2018-11-19 ENCOUNTER — Inpatient Hospital Stay (HOSPITAL_COMMUNITY): Payer: Medicaid Other

## 2018-11-19 ENCOUNTER — Encounter (HOSPITAL_COMMUNITY)
Admission: EM | Disposition: A | Discharge status: Skilled Nursing Facility | Payer: Self-pay | Source: Skilled Nursing Facility | Attending: Family Medicine

## 2018-11-19 ENCOUNTER — Inpatient Hospital Stay (HOSPITAL_COMMUNITY): Payer: Medicaid Other | Admitting: Anesthesiology

## 2018-11-19 ENCOUNTER — Encounter (HOSPITAL_COMMUNITY): Payer: Self-pay | Admitting: Certified Registered"

## 2018-11-19 DIAGNOSIS — T8781 Dehiscence of amputation stump: Secondary | ICD-10-CM

## 2018-11-19 DIAGNOSIS — E1142 Type 2 diabetes mellitus with diabetic polyneuropathy: Secondary | ICD-10-CM

## 2018-11-19 DIAGNOSIS — L03115 Cellulitis of right lower limb: Secondary | ICD-10-CM

## 2018-11-19 DIAGNOSIS — T8740 Infection of amputation stump, unspecified extremity: Secondary | ICD-10-CM

## 2018-11-19 HISTORY — PX: AMPUTATION: SHX166

## 2018-11-19 LAB — GLUCOSE, CAPILLARY
Glucose-Capillary: 135 mg/dL — ABNORMAL HIGH (ref 70–99)
Glucose-Capillary: 131 mg/dL — ABNORMAL HIGH (ref 70–99)
Glucose-Capillary: 134 mg/dL — ABNORMAL HIGH (ref 70–99)
Glucose-Capillary: 115 mg/dL — ABNORMAL HIGH (ref 70–99)
Glucose-Capillary: 138 mg/dL — ABNORMAL HIGH (ref 70–99)
Glucose-Capillary: 291 mg/dL — ABNORMAL HIGH (ref 70–99)

## 2018-11-19 LAB — BASIC METABOLIC PANEL
Anion gap: 13 (ref 5–15)
BUN: 23 mg/dL — ABNORMAL HIGH (ref 6–20)
CO2: 27 mmol/L (ref 22–32)
Calcium: 8.9 mg/dL (ref 8.9–10.3)
Chloride: 99 mmol/L (ref 98–111)
Creatinine, Ser: 2.11 mg/dL — ABNORMAL HIGH (ref 0.61–1.24)
GFR calc Af Amer: 39 mL/min — ABNORMAL LOW (ref >60)
GFR calc non Af Amer: 33 mL/min — ABNORMAL LOW (ref >60)
Glucose, Bld: 158 mg/dL — ABNORMAL HIGH (ref 70–99)
Potassium: 3.8 mmol/L (ref 3.5–5.1)
Sodium: 139 mmol/L (ref 135–145)

## 2018-11-19 LAB — CBC
HCT: 27.4 % — ABNORMAL LOW (ref 39.0–52.0)
Hemoglobin: 8.1 g/dL — ABNORMAL LOW (ref 13.0–17.0)
MCH: 24.3 pg — ABNORMAL LOW (ref 26.0–34.0)
MCHC: 29.6 g/dL — ABNORMAL LOW (ref 30.0–36.0)
MCV: 82.3 fL (ref 80.0–100.0)
Platelets: 192 10*3/uL (ref 150–400)
RBC: 3.33 MIL/uL — ABNORMAL LOW (ref 4.22–5.81)
RDW: 17.7 % — ABNORMAL HIGH (ref 11.5–15.5)
WBC: 5.5 10*3/uL (ref 4.0–10.5)
nRBC: 0 % (ref 0.0–0.2)

## 2018-11-19 SURGERY — AMPUTATION, ABOVE KNEE
Anesthesia: General | Laterality: Right

## 2018-11-19 MED ORDER — PROPOFOL 10 MG/ML IV BOLUS
INTRAVENOUS | Status: AC
  Filled 2018-11-19: qty 20

## 2018-11-19 MED ORDER — ONDANSETRON HCL 4 MG/2ML IJ SOLN
INTRAMUSCULAR | Status: DC | PRN
  Administered 2018-11-19: 4 mg via INTRAVENOUS

## 2018-11-19 MED ORDER — SUCCINYLCHOLINE CHLORIDE 200 MG/10ML IV SOSY
PREFILLED_SYRINGE | INTRAVENOUS | Status: AC
  Filled 2018-11-19: qty 10

## 2018-11-19 MED ORDER — GLYCOPYRROLATE PF 0.2 MG/ML IJ SOSY
PREFILLED_SYRINGE | INTRAMUSCULAR | Status: AC
  Filled 2018-11-19: qty 1

## 2018-11-19 MED ORDER — SODIUM CHLORIDE 0.9 % IV SOLN
INTRAVENOUS | Status: DC
  Administered 2018-11-19: 18:00:00 via INTRAVENOUS

## 2018-11-19 MED ORDER — FENTANYL CITRATE (PF) 100 MCG/2ML IJ SOLN
INTRAMUSCULAR | Status: AC
  Administered 2018-11-19: 17:00:00 50 ug via INTRAVENOUS
  Filled 2018-11-19: qty 2

## 2018-11-19 MED ORDER — OXYCODONE HCL 5 MG PO TABS
ORAL_TABLET | ORAL | Status: AC
  Administered 2018-11-19: 17:00:00 5 mg via ORAL
  Filled 2018-11-19: qty 1

## 2018-11-19 MED ORDER — LIDOCAINE 2% (20 MG/ML) 5 ML SYRINGE
INTRAMUSCULAR | Status: DC | PRN
  Administered 2018-11-19: 100 mg via INTRAVENOUS

## 2018-11-19 MED ORDER — DEXAMETHASONE SODIUM PHOSPHATE 10 MG/ML IJ SOLN
INTRAMUSCULAR | Status: AC
  Filled 2018-11-19: qty 1

## 2018-11-19 MED ORDER — OXYCODONE HCL 5 MG/5ML PO SOLN: 5.0000 mg | Freq: Once | ORAL | Status: AC | PRN

## 2018-11-19 MED ORDER — FENTANYL CITRATE (PF) 100 MCG/2ML IJ SOLN
INTRAMUSCULAR | Status: DC | PRN
  Administered 2018-11-19: 50 ug via INTRAVENOUS

## 2018-11-19 MED ORDER — ONDANSETRON HCL 4 MG/2ML IJ SOLN
INTRAMUSCULAR | Status: AC
  Filled 2018-11-19: qty 2

## 2018-11-19 MED ORDER — TEMAZEPAM 15 MG PO CAPS
15.0000 mg | ORAL_CAPSULE | Freq: Once | ORAL | Status: AC
  Administered 2018-11-19: 01:00:00 15 mg via ORAL
  Filled 2018-11-19: qty 1

## 2018-11-19 MED ORDER — FENTANYL CITRATE (PF) 100 MCG/2ML IJ SOLN
25.0000 ug | INTRAMUSCULAR | Status: DC | PRN
  Administered 2018-11-19 (×2): 50 ug via INTRAVENOUS

## 2018-11-19 MED ORDER — ONDANSETRON HCL 4 MG/2ML IJ SOLN: 4.0000 mg | Freq: Once | INTRAMUSCULAR | Status: DC | PRN

## 2018-11-19 MED ORDER — MIDAZOLAM HCL 2 MG/2ML IJ SOLN
2.0000 mg | Freq: Once | INTRAMUSCULAR | Status: AC
  Administered 2018-11-19: 2 mg via INTRAVENOUS

## 2018-11-19 MED ORDER — CEFAZOLIN SODIUM-DEXTROSE 2-4 GM/100ML-% IV SOLN: 2.0000 g | INTRAVENOUS | Status: DC

## 2018-11-19 MED ORDER — OXYCODONE HCL 5 MG PO TABS
10.0000 mg | ORAL_TABLET | ORAL | Status: DC | PRN
  Administered 2018-11-19 – 2018-11-21 (×7): 15 mg via ORAL
  Filled 2018-11-19 (×7): qty 3

## 2018-11-19 MED ORDER — ACETAMINOPHEN 325 MG PO TABS
325.0000 mg | ORAL_TABLET | Freq: Four times a day (QID) | ORAL | Status: DC | PRN
  Filled 2018-11-19: qty 2

## 2018-11-19 MED ORDER — DEXAMETHASONE SODIUM PHOSPHATE 4 MG/ML IJ SOLN
INTRAMUSCULAR | Status: DC | PRN
  Administered 2018-11-19: 6 mg via INTRAVENOUS

## 2018-11-19 MED ORDER — DOCUSATE SODIUM 100 MG PO CAPS
100.0000 mg | ORAL_CAPSULE | Freq: Two times a day (BID) | ORAL | Status: DC
  Administered 2018-11-19 – 2018-11-21 (×4): 100 mg via ORAL
  Filled 2018-11-19 (×4): qty 1

## 2018-11-19 MED ORDER — PROPOFOL 10 MG/ML IV BOLUS
INTRAVENOUS | Status: DC | PRN
  Administered 2018-11-19: 150 mg via INTRAVENOUS

## 2018-11-19 MED ORDER — HYDROMORPHONE HCL 1 MG/ML IJ SOLN
0.5000 mg | INTRAMUSCULAR | Status: DC | PRN
  Administered 2018-11-19 – 2018-11-21 (×8): 1 mg via INTRAVENOUS
  Filled 2018-11-19 (×8): qty 1

## 2018-11-19 MED ORDER — GLYCOPYRROLATE PF 0.2 MG/ML IJ SOSY
PREFILLED_SYRINGE | INTRAMUSCULAR | Status: DC | PRN
  Administered 2018-11-19: .1 mg via INTRAVENOUS

## 2018-11-19 MED ORDER — CEFAZOLIN SODIUM-DEXTROSE 2-4 GM/100ML-% IV SOLN
INTRAVENOUS | Status: AC
  Filled 2018-11-19: qty 100

## 2018-11-19 MED ORDER — FENTANYL CITRATE (PF) 100 MCG/2ML IJ SOLN
INTRAMUSCULAR | Status: AC
  Administered 2018-11-19: 100 ug via INTRAVENOUS
  Filled 2018-11-19: qty 2

## 2018-11-19 MED ORDER — MIDAZOLAM HCL 2 MG/2ML IJ SOLN
INTRAMUSCULAR | Status: AC
  Filled 2018-11-19: qty 2

## 2018-11-19 MED ORDER — LIDOCAINE 2% (20 MG/ML) 5 ML SYRINGE
INTRAMUSCULAR | Status: AC
  Filled 2018-11-19: qty 5

## 2018-11-19 MED ORDER — METHOCARBAMOL 1000 MG/10ML IJ SOLN
500.0000 mg | Freq: Four times a day (QID) | INTRAVENOUS | Status: DC | PRN
  Filled 2018-11-19: qty 5

## 2018-11-19 MED ORDER — SODIUM CHLORIDE 0.9 % IV SOLN
INTRAVENOUS | Status: DC | PRN
  Administered 2018-11-19: 16:00:00 60 ug/min via INTRAVENOUS

## 2018-11-19 MED ORDER — 0.9 % SODIUM CHLORIDE (POUR BTL) OPTIME
TOPICAL | Status: DC | PRN
  Administered 2018-11-19: 15:00:00 1000 mL

## 2018-11-19 MED ORDER — METHOCARBAMOL 500 MG PO TABS
500.0000 mg | ORAL_TABLET | Freq: Four times a day (QID) | ORAL | Status: DC | PRN
  Administered 2018-11-19 – 2018-11-21 (×6): 500 mg via ORAL
  Filled 2018-11-19 (×6): qty 1

## 2018-11-19 MED ORDER — BISACODYL 10 MG RE SUPP: 10.0000 mg | Freq: Every day | RECTAL | Status: DC | PRN

## 2018-11-19 MED ORDER — MIDAZOLAM HCL 2 MG/2ML IJ SOLN
INTRAMUSCULAR | Status: AC
  Administered 2018-11-19: 15:00:00 2 mg via INTRAVENOUS
  Filled 2018-11-19: qty 2

## 2018-11-19 MED ORDER — POLYETHYLENE GLYCOL 3350 17 G PO PACK: 17.0000 g | PACK | Freq: Every day | ORAL | Status: DC | PRN

## 2018-11-19 MED ORDER — OXYCODONE HCL 5 MG PO TABS
5.0000 mg | ORAL_TABLET | Freq: Once | ORAL | Status: AC | PRN
  Administered 2018-11-19: 17:00:00 5 mg via ORAL

## 2018-11-19 MED ORDER — BUPIVACAINE HCL (PF) 0.5 % IJ SOLN
INTRAMUSCULAR | Status: DC | PRN
  Administered 2018-11-19 (×2): 15 mL via PERINEURAL

## 2018-11-19 MED ORDER — FENTANYL CITRATE (PF) 250 MCG/5ML IJ SOLN
INTRAMUSCULAR | Status: AC
  Filled 2018-11-19: qty 5

## 2018-11-19 MED ORDER — SODIUM CHLORIDE 0.9% FLUSH
10.0000 mL | Freq: Two times a day (BID) | INTRAVENOUS | Status: DC
  Administered 2018-11-19 – 2018-11-22 (×5): 10 mL

## 2018-11-19 MED ORDER — BUPIVACAINE LIPOSOME 1.3 % IJ SUSP
INTRAMUSCULAR | Status: DC | PRN
  Administered 2018-11-19: 10 mL

## 2018-11-19 MED ORDER — METOCLOPRAMIDE HCL 5 MG/ML IJ SOLN: 5.0000 mg | Freq: Three times a day (TID) | INTRAMUSCULAR | Status: DC | PRN

## 2018-11-19 MED ORDER — LACTATED RINGERS IV SOLN: INTRAVENOUS | Status: DC

## 2018-11-19 MED ORDER — FENTANYL CITRATE (PF) 100 MCG/2ML IJ SOLN
100.0000 ug | Freq: Once | INTRAMUSCULAR | Status: AC
  Administered 2018-11-19: 100 ug via INTRAVENOUS

## 2018-11-19 MED ORDER — LACTATED RINGERS IV SOLN
INTRAVENOUS | Status: DC | PRN
  Administered 2018-11-19: 15:00:00 via INTRAVENOUS

## 2018-11-19 MED ORDER — ONDANSETRON HCL 4 MG PO TABS
4.0000 mg | ORAL_TABLET | Freq: Four times a day (QID) | ORAL | Status: DC | PRN
  Administered 2018-11-22 (×2): 4 mg via ORAL
  Filled 2018-11-19 (×2): qty 1

## 2018-11-19 MED ORDER — PHENYLEPHRINE 40 MCG/ML (10ML) SYRINGE FOR IV PUSH (FOR BLOOD PRESSURE SUPPORT)
PREFILLED_SYRINGE | INTRAVENOUS | Status: AC
  Filled 2018-11-19: qty 10

## 2018-11-19 MED ORDER — RAMELTEON 8 MG PO TABS
8.0000 mg | ORAL_TABLET | Freq: Every day | ORAL | Status: DC
  Administered 2018-11-19 – 2018-11-21 (×3): 8 mg via ORAL
  Filled 2018-11-19 (×4): qty 1

## 2018-11-19 MED ORDER — METOCLOPRAMIDE HCL 5 MG PO TABS: 5.0000 mg | ORAL_TABLET | Freq: Three times a day (TID) | ORAL | Status: DC | PRN

## 2018-11-19 MED ORDER — MAGNESIUM CITRATE PO SOLN
1.0000 | Freq: Once | ORAL | Status: AC | PRN
  Administered 2018-11-22: 09:00:00 1 via ORAL
  Filled 2018-11-19: qty 296

## 2018-11-19 MED ORDER — SODIUM CHLORIDE 0.9% FLUSH: 10.0000 mL | INTRAVENOUS | Status: DC | PRN

## 2018-11-19 MED ORDER — OXYCODONE HCL 5 MG PO TABS
5.0000 mg | ORAL_TABLET | ORAL | Status: DC | PRN
  Administered 2018-11-20: 10 mg via ORAL
  Filled 2018-11-19: qty 2

## 2018-11-19 MED ORDER — HYDROMORPHONE HCL 1 MG/ML IJ SOLN
1.0000 mg | Freq: Once | INTRAMUSCULAR | Status: AC
  Administered 2018-11-19: 01:00:00 1 mg via INTRAVENOUS
  Filled 2018-11-19: qty 1

## 2018-11-19 MED ORDER — ONDANSETRON HCL 4 MG/2ML IJ SOLN: 4.0000 mg | Freq: Four times a day (QID) | INTRAMUSCULAR | Status: DC | PRN

## 2018-11-19 MED ORDER — PHENYLEPHRINE 40 MCG/ML (10ML) SYRINGE FOR IV PUSH (FOR BLOOD PRESSURE SUPPORT)
PREFILLED_SYRINGE | INTRAVENOUS | Status: DC | PRN
  Administered 2018-11-19: 80 ug via INTRAVENOUS

## 2018-11-19 SURGICAL SUPPLY — 39 items
BLADE SAW RECIP 87.9 MT (BLADE) ×2 IMPLANT
BLADE SURG 21 STRL SS (BLADE) ×2 IMPLANT
BNDG COHESIVE 6X5 TAN STRL LF (GAUZE/BANDAGES/DRESSINGS) ×2 IMPLANT
CANISTER WOUND CARE 500ML ATS (WOUND CARE) IMPLANT
COVER SURGICAL LIGHT HANDLE (MISCELLANEOUS) ×2 IMPLANT
COVER WAND RF STERILE (DRAPES) IMPLANT
CUFF TOURNIQUET SINGLE 34IN LL (TOURNIQUET CUFF) IMPLANT
DRAPE INCISE IOBAN 66X45 STRL (DRAPES) ×4 IMPLANT
DRAPE U-SHAPE 47X51 STRL (DRAPES) ×2 IMPLANT
DRESSING PREVENA PLUS CUSTOM (GAUZE/BANDAGES/DRESSINGS) ×1 IMPLANT
DRSG PREVENA PLUS CUSTOM (GAUZE/BANDAGES/DRESSINGS) ×2
DURAPREP 26ML APPLICATOR (WOUND CARE) ×2 IMPLANT
ELECT REM PT RETURN 9FT ADLT (ELECTROSURGICAL) ×2
ELECTRODE REM PT RTRN 9FT ADLT (ELECTROSURGICAL) ×1 IMPLANT
GLOVE BIOGEL PI IND STRL 7.5 (GLOVE) ×1 IMPLANT
GLOVE BIOGEL PI IND STRL 9 (GLOVE) ×1 IMPLANT
GLOVE BIOGEL PI INDICATOR 7.5 (GLOVE) ×1
GLOVE BIOGEL PI INDICATOR 9 (GLOVE) ×1
GLOVE SURG ORTHO 9.0 STRL STRW (GLOVE) ×2 IMPLANT
GLOVE SURG SS PI 6.5 STRL IVOR (GLOVE) ×2 IMPLANT
GOWN STRL REUS W/ TWL LRG LVL3 (GOWN DISPOSABLE) ×1 IMPLANT
GOWN STRL REUS W/ TWL XL LVL3 (GOWN DISPOSABLE) ×2 IMPLANT
GOWN STRL REUS W/TWL LRG LVL3 (GOWN DISPOSABLE) ×2
GOWN STRL REUS W/TWL XL LVL3 (GOWN DISPOSABLE) ×4
KIT BASIN OR (CUSTOM PROCEDURE TRAY) ×2 IMPLANT
KIT TURNOVER KIT B (KITS) ×2 IMPLANT
MANIFOLD NEPTUNE II (INSTRUMENTS) ×2 IMPLANT
NS IRRIG 1000ML POUR BTL (IV SOLUTION) ×2 IMPLANT
PACK ORTHO EXTREMITY (CUSTOM PROCEDURE TRAY) ×2 IMPLANT
PAD ARMBOARD 7.5X6 YLW CONV (MISCELLANEOUS) ×2 IMPLANT
PREVENA RESTOR ARTHOFORM 46X30 (CANNISTER) ×2 IMPLANT
STAPLER VISISTAT 35W (STAPLE) IMPLANT
STOCKINETTE IMPERVIOUS LG (DRAPES) IMPLANT
SUT ETHILON 2 0 PSLX (SUTURE) ×4 IMPLANT
SUT SILK 2 0 (SUTURE) ×2
SUT SILK 2-0 18XBRD TIE 12 (SUTURE) ×1 IMPLANT
TOWEL GREEN STERILE FF (TOWEL DISPOSABLE) ×2 IMPLANT
TUBE CONNECTING 20X1/4 (TUBING) ×2 IMPLANT
YANKAUER SUCT BULB TIP NO VENT (SUCTIONS) ×2 IMPLANT

## 2018-11-19 NOTE — Anesthesia Procedure Notes (Signed)
Anesthesia Regional Block: Popliteal block (subgluteal sciatic)   Pre-Anesthetic Checklist: ,, timeout performed, Correct Patient, Correct Site, Correct Laterality, Correct Procedure, Correct Position, site marked, Risks and benefits discussed,  Surgical consent,  Pre-op evaluation,  At surgeon's request and post-op pain management  Laterality: Right  Prep: chloraprep       Needles:  Injection technique: Single-shot  Needle Type: Echogenic Stimulator Needle     Needle Length: 9cm  Needle Gauge: 21     Additional Needles:   Procedures:, nerve stimulator,,, ultrasound used (permanent image in chart),,,,   Nerve Stimulator or Paresthesia:  Response: hamstring, 1 mA,   Additional Responses:   Narrative:  Start time: 11/19/2018 2:58 PM End time: 11/19/2018 3:06 PM Injection made incrementally with aspirations every 5 mL.  Performed by: Personally  Anesthesiologist: Lidia Collum, MD  Additional Notes: Monitors applied. Injection made in 5cc increments. No resistance to injection. Good needle visualization. Patient tolerated procedure well.

## 2018-11-19 NOTE — Progress Notes (Signed)
Family Medicine Teaching Service Daily Progress Note Intern Pager: [phone]  Patient name: Gregory [name] Medical record number: [id number] Date of birth: 1960-07-01 Age: 58 y.o. 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 male presenting with infection at wound site from [place] from [name] May 6.. PMH is significant for HFpEF, diabetes, peripheral neuropathy, edema with stasis changes, history of multiple amputations with revisions, urinary difficulty, chronic pain, COPD, hypertension, hyperlipidemia, blindness in left eye, history of cancer.  Surgical site infection with dehiscence N.p.o. since midnight.  Planning to go to the OR today for above-the-knee amputation.  We will follow-up following surgery and adjust antibiotics based on surgery recommendations. -Orthopedics planning surgery on 6/3 -N.p.o. at midnight -Continue vancomycin and Zosyn  -Heparin per pharmacy -Pain control as below  History of HFpEF, stable Increasing lower extremity edema of the left leg during the past week per patient report.  Physical exam shows 1+ pitting edema to the hip and rales in lower fields bilaterally on lung auscultation.  Given his history of heart failure may be experiencing mild fluid overload during his hospital stay likely secondary to antibiotic administration.  He was gently diuresed on 6/2.  Will consider additional diuresis following surgery.  No respiratory symptoms at this time. -Metoprolol titrate 25 mg twice daily -Lasix p.o. 40 mg daily -Follow-up echo  Edema of the left leg Lower extremity Doppler shows no evidence of DVT.  Mild improvement in lower extremity edema following addition of p.o. Lasix on 6/2 per patient report.  Patient shows signs of mild fluid overload will  consider additional diuresis following surgery. -Consider p.o. Lasix for lower extremity swelling  AKI Baseline creatinine 1.6.  Creatinine increased from 1.8-2.1 since admission.  Likely secondary to mild fluid overload. -Diurese as above  Anemia, iron deficiency Iron 23, ferritin 22, iron saturation 9.  Hemoglobin this morning 8.4. -Ferrous sulfate 325 daily  Diabetes, type II A1c 7.7.  Holding home medications.  CBGs 153-218 in the past 24 hours.  7 units insulin given in the past 24 hours. -Insulin sliding scale, sensitive  Nodule under right eye -Continue to monitor -Outpatient follow-up  Hypertension Blood pressures normotensive from admission. -Amlodipine 10 mg -Furosemide 40 mg daily -Metoprolol tartrate 25 mg twice daily  Chronic pain -Tylenol 650 every 6 hours -Baclofen 5 mg 3 times daily as needed -MS Contin 15 mg every 12 hours -Oxycodone IR 20 mg every 6 hours as needed -Pregabalin 75 mg twice daily  COPD, stable -Dulera -Incruse Ellipta  Urinary retention -Lasix -Oxybutynin -Finasteride -Flomax  Hyperlipidemia Total cholesterol 92, HDL 19, LDL 18 -Atorvastatin 20 mg daily  Depression -Paroxetine 20 mg daily -Wellbutrin daily  Tobacco use -Nicotine patch 7 mg daily  Allergies -Flonase -Loratadine  FEN/GI: NPO PPx: heparin  Disposition: Pending surgery on 6/3.  Subjective:  He reports mild improvement in his left lower leg swelling after Lasix.  He also reports that he slept better after given the sleep aid and Dilaudid.  He has no new complaints this morning.  Denies shortness of breath, chest pain, nausea, vomiting.  Objective: Temp:  [97.9 F (36.6 C)-98.1 F (36.7 C)] 97.9 F (36.6 C) (06/03 0555) Pulse Rate:  [59-77] 59 (06/03 0555) Resp:  [18-19] 18 (06/03 0555) BP: (128-145)/(56-73) 128/73 (06/03 0555) SpO2:  [  95 %-97 %] 95 % (06/03 0555)  Physical Exam: General: Alert and cooperative and appears to be in no acute  distress Cardio: Normal S1 and S2, no S3 or S4. Rhythm is regular. No murmurs or rubs.   Pulm: Breathing comfortably on room air.  Mild rales noted in lower fields bilaterally.  No wheezing/stridor noted. Abdomen: Bowel sounds normal. Abdomen soft and non-tender.  Extremities: 2+ pitting edema noted to the mid thigh.  Surgical site remains mildly erythematous with purulent drainage. Neuro: Cranial nerves grossly intact   Laboratory: Recent Labs  Lab 11/17/18 0406 11/18/18 0502 11/19/18 0423  WBC 7.2 5.5 5.5  HGB 9.3* 8.4* 8.1*  HCT 31.1* 28.1* 27.4*  PLT 208 188 192   Recent Labs  Lab 11/17/18 0406 11/18/18 0502 11/19/18 0423  NA 136 139 139  K 4.0 4.0 3.8  CL 99 99 99  CO2 27 25 27   BUN 23* 21* 23*  CREATININE 1.80* 1.79* 2.11*  CALCIUM 8.6* 8.3* 8.9  GLUCOSE 185* 173* 158*    Imaging/Diagnostic Tests: Vas [place] Lower Extremity Venous (dvt)  Result Date: 11/18/2018  Lower Venous Study Indications: Edema, and Swelling.  Limitations: Poor ultrasound/tissue interface. Performing Technologist: Abram Sander RVS  Examination Guidelines: A complete evaluation includes B-mode imaging, spectral Doppler, color Doppler, and power Doppler as needed of all accessible portions of each vessel. Bilateral testing is considered an integral part of a complete examination. Limited examinations for reoccurring indications may be performed as noted.  +-----+---------------+---------+-----------+----------+-------+ RIGHTCompressibilityPhasicitySpontaneityPropertiesSummary +-----+---------------+---------+-----------+----------+-------+ CFV  Full           Yes      Yes                          +-----+---------------+---------+-----------+----------+-------+   +---------+---------------+---------+-----------+----------+--------------+ LEFT     CompressibilityPhasicitySpontaneityPropertiesSummary        +---------+---------------+---------+-----------+----------+--------------+ CFV       Full                                                        +---------+---------------+---------+-----------+----------+--------------+ SFJ      Full                                                        +---------+---------------+---------+-----------+----------+--------------+ FV Prox  Full                                                        +---------+---------------+---------+-----------+----------+--------------+ FV Mid   Full                                                        +---------+---------------+---------+-----------+----------+--------------+ FV DistalFull                                                        +---------+---------------+---------+-----------+----------+--------------+  PFV      Full                                                        +---------+---------------+---------+-----------+----------+--------------+ POP      Full           Yes      Yes                                 +---------+---------------+---------+-----------+----------+--------------+ PTV                                                   Not visualized +---------+---------------+---------+-----------+----------+--------------+ PERO                                                  Not visualized +---------+---------------+---------+-----------+----------+--------------+     Summary: Right: No evidence of common femoral vein obstruction. Left: There is no evidence of deep vein thrombosis in the lower extremity. However, portions of this examination were limited- see technologist comments above. No cystic structure found in the popliteal fossa.  *See table(s) above for measurements and observations. Electronically signed by Deitra Mayo MD on 11/18/2018 at 12:53:12 PM.    Final     Matilde Haymaker, MD 11/19/2018, 6:39 AM PGY-1, [place] Intern pager: [phone], text pages welcome

## 2018-11-19 NOTE — Anesthesia Procedure Notes (Signed)
Anesthesia Regional Block: Femoral nerve block   Pre-Anesthetic Checklist: ,, timeout performed, Correct Patient, Correct Site, Correct Laterality, Correct Procedure, Correct Position, site marked, Risks and benefits discussed,  Surgical consent,  Pre-op evaluation,  At surgeon's request and post-op pain management  Laterality: Right  Prep: chloraprep       Needles:  Injection technique: Single-shot  Needle Type: Echogenic Stimulator Needle     Needle Length: 9cm  Needle Gauge: 21     Additional Needles:   Procedures:, nerve stimulator,,, ultrasound used (permanent image in chart),,,,   Nerve Stimulator or Paresthesia:  Response: quadriceps, 1 mA,   Additional Responses:   Narrative:  Start time: 11/19/2018 2:55 PM End time: 11/19/2018 2:58 PM Injection made incrementally with aspirations every 5 mL.  Performed by: Personally  Anesthesiologist: Lidia Collum, MD  Additional Notes: Monitors applied. Injection made in 5cc increments. No resistance to injection. Good needle visualization. Patient tolerated procedure well.

## 2018-11-19 NOTE — Transfer of Care (Signed)
Immediate Anesthesia Transfer of Care Note  Patient: Gregory [name]  Procedure(s) Performed: AMPUTATION ABOVE KNEE (Right )  Patient Location: PACU  Anesthesia Type:General and Regional  Level of Consciousness: sedated and drowsy  Airway & Oxygen Therapy: Patient Spontanous Breathing and Patient connected to face mask oxygen  Post-op Assessment: Report given to RN and Post -op Vital signs reviewed and stable  Post vital signs: Reviewed and stable  Last Vitals:  Vitals Value Taken Time  BP    Temp    Pulse 60 11/19/2018  4:11 PM  Resp 9 11/19/2018  4:11 PM  SpO2 99 % 11/19/2018  4:11 PM  Vitals shown include unvalidated device data.  Last Pain:  Vitals:   11/19/18 1344  TempSrc:   PainSc: 7       Patients Stated Pain Goal: 2 (45/03/88 8280)  Complications: No apparent anesthesia complications

## 2018-11-19 NOTE — Progress Notes (Signed)
Pharmacy Antibiotic Note  Gregory [name] is a 58 y.o. male admitted on 11/16/2018 with wound infection post right BKA amputation.  Pharmacy has been consulted for vancomycin and zosyn dosing. Pt is afebrile and WBC WNL. Scr increased from 1.79 to 2.11. Recalculated AUC based on renal function change and decided to continue antibiotics based on scheduled AKA 6/3 and anticipated abx discontinuation.   Plan: Continue Zosyn 3.375g IV every 8 hour Continue Vancomycin 1250mg  IV every 24 hour     Goal AUC 400-550    Expected AUC: 576    SCr used: 2.11 Monitor renal function, C&S, and post surgery recommendations.   Height: 6' (182.9 cm) Weight: 247 lb (112 kg) IBW/kg (Calculated) : 77.6  Temp (24hrs), Avg:98 F (36.7 C), Min:97.9 F (36.6 C), Max:98.1 F (36.7 C)  Recent Labs  Lab 11/16/18 1356 11/17/18 0406 11/18/18 0502 11/19/18 0423  WBC 7.9 7.2 5.5 5.5  CREATININE 1.79* 1.80* 1.79* 2.11*    Estimated Creatinine Clearance: 48.7 mL/min (A) (by C-G formula based on SCr of 2.11 mg/dL (H)).    No Known Allergies  Antimicrobials this admission: Vanc 5/31>> Zosyn 5/31>>  Microbiology results: 5/31 BCx: ngtd 5/31 UCx: neg 5/31 COVID: neg  Thank you for allowing pharmacy to be a part of this patient's care.  [name] 11/19/2018 2:13 PM

## 2018-11-19 NOTE — Anesthesia Postprocedure Evaluation (Signed)
Anesthesia Post Note  Patient: Gregory [name]  Procedure(s) Performed: AMPUTATION ABOVE KNEE (Right )     Patient location during evaluation: PACU Anesthesia Type: General Level of consciousness: awake and alert Pain management: pain level controlled Vital Signs Assessment: post-procedure vital signs reviewed and stable Respiratory status: spontaneous breathing, nonlabored ventilation, respiratory function stable and patient connected to nasal cannula oxygen Cardiovascular status: blood pressure returned to baseline and stable Postop Assessment: no apparent nausea or vomiting Anesthetic complications: no    Last Vitals:  Vitals:   11/19/18 1718 11/19/18 2007  BP: 125/80 116/76  Pulse: 67 65  Resp: 20 18  Temp: 36.4 C 36.6 C  SpO2: 90% 95%    Last Pain:  Vitals:   11/19/18 2007  TempSrc: Oral  PainSc:                  [name]

## 2018-11-19 NOTE — NC FL2 (Signed)
[place] LEVEL OF CARE SCREENING TOOL     IDENTIFICATION  Patient Name: Gregory [name] Birthdate: 1960-07-14 Sex: male Admission Date (Current Location): 11/16/2018  [facility] and [place] Number:  Herbalist and Address:  [facility] [place]. [facility], [place] [address], [place], [place] [address]      Provider Number: [id number]  Attending Physician Name and Address:  Alveda Reasons, MD  Relative Name and Phone Number:  [name]; daughter; [phone]    Current Level of Care: Hospital Recommended Level of Care: [place] Prior Approval Number:    Date Approved/Denied:   PASRR Number: [id number]  Discharge Plan: SNF    Current Diagnoses: Patient Active Problem List   Diagnosis Date Noted  . Cellulitis of right knee   . Lymphedema of left leg   . Diabetic polyneuropathy associated with type 2 diabetes mellitus ([place])   . Wound infection 11/16/2018  . Below-knee amputation of right lower extremity ([place]) 10/22/2018  . Dehiscence of amputation stump ([place])   . 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[place]) 08/12/2018  . Candidiasis 08/12/2018  . Necrosis of toe ([place]) 07/08/2018  . Amputated toe of left foot ([place]) 06/18/2018  . Streptococcal infection   . Pressure injury of skin 06/06/2018  . Cutaneous abscess of left foot   . Moderate protein-calorie malnutrition ([place])   . Cellulitis of left leg   . Septic arthritis ([place]) 06/03/2018  . Idiopathic chronic venous hypertension of left lower extremity with ulcer and inflammation ([place]) 11/14/2017  . Great toe amputation status, left 11/14/2017  . Acquired absence of right leg below knee ([place]) 06/20/2017  . Osteomyelitis ([place]) 03/25/2017  . Skin cancer 03/25/2017  . Depression, recurrent ([place]) 11/05/2016  . Diabetic ulcer of toe of left foot ([place]) 10/02/2016  . 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 Chronic pain 09/27/2016  . Dyslipidemia associated with type 2 diabetes mellitus  ([place]) 06/06/2016  . GERD without esophagitis 05/11/2016  . Hypertensive heart disease with congestive heart failure ([place]) 12/15/2015  . Spinal stenosis in cervical region 12/15/2015  . Type II diabetes mellitus with neurological manifestations ([place]) 12/15/2015  . Chronic diastolic CHF (congestive heart failure) ([place])   . Pulmonary hypertension ([place])   . Urinary retention   . Diskitis   . Foot drop, bilateral   . Sepsis with acute renal failure without septic shock ([place])   . MRSA bacteremia 10/03/2015  . COPD (chronic obstructive pulmonary disease) ([employer]) 10/03/2015  . Acute osteomyelitis of left foot ([place]) 10/03/2015    Orientation RESPIRATION BLADDER Height & Weight     Self, Time, Situation, Place  O2(2L nasal canula) Continent Weight: 247 lb (112 kg) Height:  6' (182.9 cm)  BEHAVIORAL SYMPTOMS/MOOD NEUROLOGICAL BOWEL NUTRITION STATUS      Continent Diet(see discharge summary)  AMBULATORY STATUS COMMUNICATION OF NEEDS Skin   Extensive Assist Verbally Surgical wounds, Wound Vac, Other (Comment)(incision on right leg with wound vac (new AKA); wound on left great toe with gauze)                       Personal Care Assistance Level of Assistance  Bathing, Feeding, Dressing Bathing Assistance: Limited assistance Feeding assistance: Independent Dressing Assistance: Limited assistance     Functional Limitations Info  Speech, Hearing, Sight Sight Info: Adequate Hearing Info: Adequate Speech Info: Adequate    SPECIAL CARE FACTORS FREQUENCY  PT (By licensed PT), OT (By licensed OT)     PT  Frequency: 5x week OT Frequency: 5x week            Contractures Contractures Info: Not present    Additional Factors Info  Code Status, Allergies, Psychotropic, Insulin Sliding Scale Code Status Info: Full Code Allergies Info: No Known Allergies Psychotropic Info: buPROPion (WELLBUTRIN XL) 24 hr tablet 300 mg daily at bedtime PO; PARoxetine (PAXIL) tablet 20 mg daily at bedtime  PO Insulin Sliding Scale Info: insulin aspart (novoLOG) injection 0-9 Units        Current Medications (11/19/2018):  This is the current hospital active medication list Current Facility-Administered Medications  Medication Dose Route Frequency Provider Last Rate Last Dose  . [MAR Hold] acetaminophen (TYLENOL) tablet 650 mg  650 mg Oral Q6H Bland, Scott, DO   650 mg at 11/19/18 0754  . [MAR Hold] amLODipine (NORVASC) tablet 10 mg  10 mg Oral Daily Bland, Scott, DO   10 mg at 11/19/18 1042  . [MAR Hold] atorvastatin (LIPITOR) tablet 20 mg  20 mg Oral QHS Bland, Scott, DO   20 mg at 11/18/18 2157  . [MAR Hold] baclofen (LIORESAL) tablet 5 mg  5 mg Oral TID PRN Mullis, Kiersten P, DO   5 mg at 11/18/18 1534  . [MAR Hold] buPROPion (WELLBUTRIN XL) 24 hr tablet 300 mg  300 mg Oral QHS Bland, Scott, DO   300 mg at 11/18/18 2157  . ceFAZolin (ANCEF) 2-4 GM/100ML-% IVPB           . ceFAZolin (ANCEF) IVPB 2g/100 mL premix  2 g Intravenous On Call to OR [name], MD      . chlorhexidine (HIBICLENS) 4 % liquid 4 application  60 mL Topical Once Rayburn, Neta Mends, [name]      . fentaNYL (SUBLIMAZE) injection 25-50 mcg  25-50 mcg Intravenous Q5 min PRN Lidia Collum, MD      . [place] Hold] ferrous sulfate tablet 325 mg  325 mg Oral Q breakfast Mullis, Kiersten P, DO   325 mg at 11/18/18 4665  . [MAR Hold] finasteride (PROSCAR) tablet 5 mg  5 mg Oral Daily Bland, Scott, DO   Stopped at 11/19/18 1032  . [MAR Hold] fluticasone (FLONASE) 50 MCG/ACT nasal spray 2 spray  2 spray Each Nare QHS Matilde Haymaker, MD   2 spray at 11/18/18 2251  . [MAR Hold] furosemide (LASIX) tablet 40 mg  40 mg Oral Daily Bland, Scott, DO   40 mg at 11/19/18 1043  . [MAR Hold] insulin aspart (novoLOG) injection 0-9 Units  0-9 Units Subcutaneous TID WC Bland, Scott, DO   1 Units at 11/19/18 1302  . lactated ringers infusion   Intravenous Continuous [name], MD      . [place] Hold] loratadine (CLARITIN) tablet 10 mg  10 mg  Oral Daily Matilde Haymaker, MD   Stopped at 11/19/18 1032  . [MAR Hold] magnesium oxide (MAG-OX) tablet 400 mg  400 mg Oral BID Matilde Haymaker, MD   Stopped at 11/19/18 1032  . [MAR Hold] metoprolol tartrate (LOPRESSOR) tablet 25 mg  25 mg Oral BID Bland, Scott, DO   25 mg at 11/19/18 1042  . [MAR Hold] mirabegron ER (MYRBETRIQ) tablet 50 mg  50 mg Oral Daily Matilde Haymaker, MD   50 mg at 11/18/18 0834  . [MAR Hold] mometasone-formoterol (DULERA) 100-5 MCG/ACT inhaler 2 puff  2 puff Inhalation BID Sherene Sires, DO   2 puff at 11/17/18 1948  . [MAR Hold] morphine (MS CONTIN) 12 hr tablet  15 mg  15 mg Oral Q12H Bland, Scott, DO   15 mg at 11/19/18 1042  . [MAR Hold] nicotine (NICODERM CQ - dosed in mg/24 hours) patch 14 mg  14 mg Transdermal Daily Shawna Orleans, Elsia J, DO   14 mg at 11/19/18 1042  . ondansetron (ZOFRAN) injection 4 mg  4 mg Intravenous Once PRN Lidia Collum, MD      . [place] Hold] oxybutynin (DITROPAN XL) 24 hr tablet 15 mg  15 mg Oral QHS Bland, Scott, DO   15 mg at 11/18/18 2156  . [MAR Hold] oxyCODONE (Oxy IR/ROXICODONE) immediate release tablet 20 mg  20 mg Oral Q6H PRN Mullis, Kiersten P, DO   20 mg at 11/19/18 0818  . oxyCODONE (Oxy IR/ROXICODONE) immediate release tablet 5 mg  5 mg Oral Once PRN Lidia Collum, MD       Or  . oxyCODONE (ROXICODONE) 5 MG/5ML solution 5 mg  5 mg Oral Once PRN Lidia Collum, MD      . Doug Sou Hold] pantoprazole (PROTONIX) EC tablet 40 mg  40 mg Oral Daily Bland, Scott, DO   40 mg at 11/19/18 1047  . [MAR Hold] PARoxetine (PAXIL) tablet 20 mg  20 mg Oral QHS Bland, Scott, DO   20 mg at 11/18/18 2251  . [MAR Hold] piperacillin-tazobactam (ZOSYN) IVPB 3.375 g  3.375 g Intravenous Q8H Bland, Scott, DO   Stopped at 11/19/18 1112  . povidone-iodine 10 % swab 2 application  2 application Topical Once Rayburn, Neta Mends, [name]      . [MAR Hold] pregabalin (LYRICA) capsule 150 mg  150 mg Oral BID Matilde Haymaker, MD   150 mg at 11/19/18 1042  . [MAR Hold]  sodium chloride flush (NS) 0.9 % injection 10-40 mL  10-40 mL Intracatheter Q12H Alveda Reasons, MD   10 mL at 11/19/18 1048  . [MAR Hold] sodium chloride flush (NS) 0.9 % injection 10-40 mL  10-40 mL Intracatheter PRN Alveda Reasons, MD      . [place] Hold] tamsulosin [facility]) capsule 0.4 mg  0.4 mg Oral QPC supper Matilde Haymaker, MD   0.4 mg at 11/18/18 1721  . [MAR Hold] umeclidinium bromide (INCRUSE ELLIPTA) 62.5 MCG/INH 1 puff  1 puff Inhalation Daily Matilde Haymaker, MD      . [place] Hold] vancomycin (VANCOCIN) 1,250 mg in sodium chloride 0.9 % 250 mL IVPB  1,250 mg Intravenous Q24H Bland, Scott, DO 166.7 mL/hr at 11/18/18 1716 1,250 mg at 11/18/18 1716  . [MAR Hold] vitamin B-12 (CYANOCOBALAMIN) tablet 1,000 mcg  1,000 mcg Oral Daily Matilde Haymaker, MD   1,000 mcg at 11/18/18 7846     Discharge Medications: Please see discharge summary for a list of discharge medications.  Relevant Imaging Results:  Relevant Lab Results:   Additional Information SS#:[id number] [place] [place], [place]

## 2018-11-19 NOTE — Plan of Care (Signed)

## 2018-11-19 NOTE — Interval H&P Note (Signed)
History and Physical Interval Note:  11/19/2018 2:09 PM  Gregory [name]  has presented today for surgery, with the diagnosis of RIGHT BELOW KNEE AMPUTATION.  The various methods of treatment have been discussed with the patient and family. After consideration of risks, benefits and other options for treatment, the patient has consented to  Procedure(s): AMPUTATION ABOVE KNEE (Right) as a surgical intervention.  The patient's history has been reviewed, patient examined, no change in status, stable for surgery.  I have reviewed the patient's chart and labs.  Questions were answered to the patient's satisfaction.     [name]

## 2018-11-19 NOTE — Op Note (Signed)
11/19/2018  4:15 PM  PATIENT:  Gregory [name]    PRE-OPERATIVE DIAGNOSIS:  RIGHT BELOW KNEE AMPUTATION  POST-OPERATIVE DIAGNOSIS:  Same  PROCEDURE:  AMPUTATION ABOVE KNEE Application of cleanse towards wound VAC  SURGEON:  [name], MD  PHYSICIAN ASSISTANT:None ANESTHESIA:   General  PREOPERATIVE INDICATIONS:  [name] is a  59 y.o. male with a diagnosis of RIGHT BELOW KNEE AMPUTATION who failed conservative measures and elected for surgical management.    The risks benefits and alternatives were discussed with the patient preoperatively including but not limited to the risks of infection, bleeding, nerve injury, cardiopulmonary complications, the need for revision surgery, among others, and the patient was willing to proceed.  OPERATIVE IMPLANTS: Cleanse choice wound VAC  @ENCIMAGES @  OPERATIVE FINDINGS: Good muscle bleeding no abscess at the level of amputation  OPERATIVE PROCEDURE: Patient was brought the operating room underwent a general anesthetic.  After adequate levels anesthesia were obtained patient's right lower extremity was prepped using DuraPrep draped into a sterile field a timeout was called.  A fishmouth incision was made through the distal 5 this was carried down to near the medial vascular bundle this was clamped and suture ligated with 2-0 silk.  The amputation was completed with a reciprocating saw.  The wound was irrigated normal saline electrocautery was used for further hemostasis.  The deep fascia layers were closed with 1-0 Vicryl the skin and subcutaneous fascia layers were closed with 2-0 nylon the incision was closed using staples.  The customizable and restore wound VAC dressing was applied this had a good suction fit patient was extubated taken to PACU in stable condition.   DISCHARGE PLANNING:  Antibiotic duration: Continue IV antibiotics 24 hours postoperatively  Weightbearing: Continue nonweightbearing on the right  Pain medication: Opioid  pathway ordered  Dressing care/ Wound VAC: Continue wound VAC for 1 week  Ambulatory devices: Walker  Discharge to: Anticipate discharge to skilled nursing  Follow-up: In the office 1 week post operative.

## 2018-11-19 NOTE — Anesthesia Procedure Notes (Signed)
Procedure Name: LMA Insertion Date/Time: 11/19/2018 3:32 PM Performed by: Orlie Dakin, CRNA Pre-anesthesia Checklist: Patient identified, Emergency Drugs available, Suction available and Patient being monitored Patient Re-evaluated:Patient Re-evaluated prior to induction Oxygen Delivery Method: Circle system utilized Preoxygenation: Pre-oxygenation with 100% oxygen Induction Type: IV induction LMA: LMA inserted LMA Size: 5.0 Tube type: Oral Number of attempts: 1 Placement Confirmation: positive ETCO2 Tube secured with: Tape Dental Injury: Teeth and Oropharynx as per pre-operative assessment

## 2018-11-19 NOTE — Procedures (Signed)
Patient refuses a second time to have echocardiogram.

## 2018-11-19 NOTE — Anesthesia Preprocedure Evaluation (Signed)
Anesthesia Evaluation  Patient identified by MRN, date of birth, ID band Patient awake    Reviewed: Allergy & Precautions, H&P , NPO status , Patient's Chart, lab work & pertinent test results  Airway Mallampati: II   Neck ROM: full    Dental   Pulmonary COPD, Current Smoker,    Pulmonary exam normal        Cardiovascular hypertension, +CHF  Normal cardiovascular exam Rhythm:regular Rate:Normal     Neuro/Psych PSYCHIATRIC DISORDERS Depression  Neuromuscular disease    GI/Hepatic GERD  ,  Endo/Other  diabetes, Type 2  Renal/GU Renal InsufficiencyRenal disease     Musculoskeletal  (+) Arthritis ,   Abdominal   Peds  Hematology   Anesthesia Other Findings No significant interval change in history since last anesthetic in May.   Reproductive/Obstetrics                             Anesthesia Physical  Anesthesia Plan  ASA: III  Anesthesia Plan: General   Post-op Pain Management: GA combined w/ Regional for post-op pain   Induction: Intravenous  PONV Risk Score and Plan: 1 and Ondansetron, Midazolam, Treatment may vary due to age or medical condition and Dexamethasone  Airway Management Planned: LMA  Additional Equipment: None  Intra-op Plan:   Post-operative Plan: Extubation in OR  Informed Consent: I have reviewed the patients History and Physical, chart, labs and discussed the procedure including the risks, benefits and alternatives for the proposed anesthesia with the patient or authorized representative who has indicated his/her understanding and acceptance.     Dental advisory given  Plan Discussed with: CRNA, Anesthesiologist and Surgeon  Anesthesia Plan Comments: (PAT note written 10/21/2018 by Myra Gianotti, [name]. )       Anesthesia Quick Evaluation

## 2018-11-19 NOTE — Progress Notes (Signed)
Pt alert, able to make needs known. No acute distress. C/o pain in right stump. Medicated as needed. Completed assessment, noted that the pts stump was red, warm to touch. Also noted that pts left lower leg was warm to touch. Asked pt if he had a wound on that side when I noticed the top of a dressing.Pulled a dirty sock off of pt's foot. Dressing noted to be dark grey in color. Pt stated it hadn't been changed in 4 days. Removed dressing, noted that the pt had his great toe removed. He stated that it had been a little while since he had the surgery. Noted that the top if the incision had a slough/yellow appearance. Area cleaned and new dress applied. MD made aware. Continue to monitor

## 2018-11-19 NOTE — Progress Notes (Signed)
FPTS Interim Progress Note  Received page from nurse concerning patients pain. Spoke to patient. Patient notes severe RLE pain that is worse than from admission, "really bothering him tonight" and home meds are "just not cutting it". Received scheduled MS contin 2 hours prior. Discussed with pharmacy for recommendations.   Plan: 1mg  IV dilaudid x1 Restorilx1 for sleep   Danna Hefty, DO 11/19/2018, 12:13 AM PGY-1, [place] Medicine Service pager [phone]

## 2018-11-20 ENCOUNTER — Encounter (HOSPITAL_COMMUNITY): Payer: Self-pay | Admitting: Orthopedic Surgery

## 2018-11-20 ENCOUNTER — Inpatient Hospital Stay (HOSPITAL_COMMUNITY): Payer: Medicaid Other

## 2018-11-20 LAB — CBC
HCT: 26.3 % — ABNORMAL LOW (ref 39.0–52.0)
Hemoglobin: 7.8 g/dL — ABNORMAL LOW (ref 13.0–17.0)
MCH: 24.8 pg — ABNORMAL LOW (ref 26.0–34.0)
MCHC: 29.7 g/dL — ABNORMAL LOW (ref 30.0–36.0)
MCV: 83.5 fL (ref 80.0–100.0)
Platelets: 193 10*3/uL (ref 150–400)
RBC: 3.15 MIL/uL — ABNORMAL LOW (ref 4.22–5.81)
RDW: 17.6 % — ABNORMAL HIGH (ref 11.5–15.5)
WBC: 6.3 10*3/uL (ref 4.0–10.5)
nRBC: 0 % (ref 0.0–0.2)

## 2018-11-20 LAB — BASIC METABOLIC PANEL
Anion gap: 11 (ref 5–15)
BUN: 29 mg/dL — ABNORMAL HIGH (ref 6–20)
CO2: 26 mmol/L (ref 22–32)
Calcium: 8.7 mg/dL — ABNORMAL LOW (ref 8.9–10.3)
Chloride: 100 mmol/L (ref 98–111)
Creatinine, Ser: 1.96 mg/dL — ABNORMAL HIGH (ref 0.61–1.24)
GFR calc Af Amer: 42 mL/min — ABNORMAL LOW (ref >60)
GFR calc non Af Amer: 36 mL/min — ABNORMAL LOW (ref >60)
Glucose, Bld: 283 mg/dL — ABNORMAL HIGH (ref 70–99)
Potassium: 4.3 mmol/L (ref 3.5–5.1)
Sodium: 137 mmol/L (ref 135–145)

## 2018-11-20 LAB — GLUCOSE, CAPILLARY
Glucose-Capillary: 260 mg/dL — ABNORMAL HIGH (ref 70–99)
Glucose-Capillary: 200 mg/dL — ABNORMAL HIGH (ref 70–99)
Glucose-Capillary: 184 mg/dL — ABNORMAL HIGH (ref 70–99)
Glucose-Capillary: 190 mg/dL — ABNORMAL HIGH (ref 70–99)

## 2018-11-20 LAB — SARS CORONAVIRUS 2: SARS Coronavirus 2: NOT DETECTED

## 2018-11-20 MED ORDER — FUROSEMIDE 10 MG/ML IJ SOLN
40.0000 mg | Freq: Every day | INTRAMUSCULAR | Status: DC
  Administered 2018-11-20: 40 mg via INTRAVENOUS
  Filled 2018-11-20: qty 4

## 2018-11-20 MED ORDER — FUROSEMIDE 10 MG/ML IJ SOLN: 40.0000 mg | Freq: Two times a day (BID) | INTRAMUSCULAR | Status: DC

## 2018-11-20 MED ORDER — GUAIFENESIN ER 600 MG PO TB12
600.0000 mg | ORAL_TABLET | Freq: Two times a day (BID) | ORAL | Status: DC
  Administered 2018-11-20 – 2018-11-22 (×6): 600 mg via ORAL
  Filled 2018-11-20 (×6): qty 1

## 2018-11-20 MED ORDER — POLYETHYLENE GLYCOL 3350 17 G PO PACK
17.0000 g | PACK | Freq: Every day | ORAL | Status: DC
  Administered 2018-11-21 – 2018-11-22 (×2): 17 g via ORAL
  Filled 2018-11-20 (×2): qty 1

## 2018-11-20 NOTE — Plan of Care (Signed)

## 2018-11-20 NOTE — Progress Notes (Signed)
Inpatient Diabetes Program Recommendations  AACE/ADA: New Consensus Statement on Inpatient Glycemic Control (2015)  Target Ranges:  Prepandial:   less than 140 mg/dL      Peak postprandial:   less than 180 mg/dL (1-2 hours)      Critically ill patients:  140 - 180 mg/dL   Results for Gregory [name], Gregory [name] (MRN [id number]) as of 11/20/2018 14:28  Ref. Range 11/19/2018 08:24 11/19/2018 10:11 11/19/2018 11:53 11/19/2018 16:12 11/19/2018 17:27 11/19/2018 21:00 11/20/2018 07:41 11/20/2018 11:27  Glucose-Capillary Latest Ref Range: 70 - 99 mg/dL 135 (H) 131 (H) 134 (H) 115 (H) 138 (H) 291 (H) 260 (H) 200 (H)   Review of Glycemic Control  Diabetes history: DM 2 Outpatient Diabetes medications: Glipizide Daily, Humalog 0-10 units tid Current orders for Inpatient glycemic control:  Novolog 0-9 units tid  A1c 7.7 % on 6/1  Inpatient Diabetes Program Recommendations:    Glucose trends consistently in the 200 range after patient received Decadron 6 mg.  Consider Q6 hour Novolog Correction scale for another 24 hours. Then resume Novolog tid + Novolog hs scale as glucose trends should decrease within the next 24-48 hours.  Thanks,  Tama Headings RN, MSN, BC-ADM Inpatient Diabetes Coordinator Team Pager [phone] (8a-5p)

## 2018-11-20 NOTE — Progress Notes (Signed)
PT Cancellation Note  Patient Details Name: Gregory [name] MRN: [id number] DOB: December 26, 1960   Cancelled Treatment:    Reason Eval/Treat Not Completed: Patient declined, no reason specified. Requesting PT return tomorrow for evaluation. Pt familiar to me from recent admission 10/22/18 for R BKA revision, had then declined working with acute PT while admitted, but no reports he is agreeable.  Mabeline Caras, PT, DPT Acute Rehabilitation Services  Pager [phone] Office [place] 11/20/2018, 2:03 PM

## 2018-11-20 NOTE — Progress Notes (Signed)
Patient refusing echo for third time.  Gregory [name] Gregory [name]

## 2018-11-20 NOTE — Progress Notes (Signed)
Family Medicine Teaching Service Daily Progress Note Intern Pager: [phone]  Patient name: Gregory [name] Medical record number: [id number] Date of birth: 1961-04-06 Age: 58 y.o. 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 male presenting with infection at wound site from [place] from [name] May 6.. PMH is significant for HFpEF, diabetes, peripheral neuropathy, edema with stasis changes, history of multiple amputations with revisions, urinary difficulty, chronic pain, COPD, hypertension, hyperlipidemia, blindness in left eye, history of cancer.  Above-the-knee amputation Above-the-knee amputation performed on 6/3.  No complications.  [name] recommends 24 hours of antibiotics following surgery, wound VAC for 1 week, follow-up in clinic in 1 week.  Patient reports that he has been urinating and passing gas since the procedure.  No bowel movements yet. -Continue vancomycin/Zosyn through 6/4 -Pain regimen includes: --MS Contin 15 mg every 12 hours --Oxycodone 5-10 mg every 4 hours as needed for moderate pain --Oxycodone 10-15 mg every 4 hours as needed for severe pain --Dilaudid 0.5-1 mg every 4 hours for severe pain -PT/OT evaluate and treat -Docusate twice daily -miralax -Dulcolax as needed  History of HFpEF, stable -Metoprolol titrate 25 mg twice daily -Lasix p.o. 40 mg daily, increase to Lasix IV 40 mg daily -Strict I's and O's -Daily weights  Edema of the left leg Likely secondary to venous stasis in addition to heart failure.. -Diuresis as above  AKI-improved Baseline creatinine 1.6.  Creatinine 1.96 (6/4) from 2.1 (6/3) -Diurese as above  Anemia, iron deficiency Iron 23, ferritin 22, iron saturation 9.  Hemoglobin 7.8 (6/4) from 8.1. -Ferrous sulfate 325  daily -Daily CBC  Diabetes, type II A1c 7.7.  Holding home medications.  CBGs [id number] in the past 24 hours 2 units aspart given -Insulin sliding scale, sensitive  Nodule under right eye -Continue to monitor -Outpatient follow-up  Hypertension Blood pressures normotensive from admission. -Amlodipine 10 mg -Furosemide 40 mg daily -Metoprolol tartrate 25 mg twice daily  Chronic pain -Tylenol 650 every 6 hours -Baclofen 5 mg 3 times daily as needed -MS Contin 15 mg every 12 hours -Oxycodone IR 20 mg every 6 hours as needed -Pregabalin 75 mg twice daily  COPD, stable -Dulera -Incruse Ellipta  Urinary retention -Lasix -Oxybutynin -Finasteride -Flomax  Hyperlipidemia Total cholesterol 92, HDL 19, LDL 18 -Atorvastatin 20 mg daily  Depression -Paroxetine 20 mg daily -Wellbutrin daily  Tobacco use -Nicotine patch 7 mg daily  Allergies -Flonase -Loratadine  FEN/GI: NPO PPx: heparin  Disposition: Pending surgery on 6/3.  Subjective:  The surgery yesterday.  This morning he reports that he has been urinating well been passing gas though he has not yet had a bowel movement.  He has not yet been out of bed since the surgery.  He reports that his pain has been moderately well controlled.  He did note that he does not want to be on Dilaudid upon discharge.  Objective: Temp:  [97.3 F (36.3 C)-98.3 F (36.8 C)] 98.3 F (36.8 C) (06/04 0349) Pulse Rate:  [56-69] 67 (06/04 0349) Resp:  [12-20] 16 (06/04 0349) BP: (116-145)/(64-92) 137/71 (06/04 0349) SpO2:  [90 %-99 %] 95 % (06/04 0349)  Physical Exam: General: Alert and cooperative and appears to be in no acute distress HEENT: Neck non-tender without lymphadenopathy, masses or thyromegaly Cardio: Normal S1 and  S2, no S3 or S4. Rhythm is regular. No murmurs or rubs.   Pulm: Rales noted in lower and middle fields bilaterally.  No wheezing/stridor noted.  Breathing comfortably on room air. Abdomen: Bowel sounds  normal. Abdomen soft, protuberant and non-tender.  Extremities: Lower extremity edema noted in left leg above the knee.  Left toe wrapped in Ace bandage.  Wound VAC covering right leg below site. Neuro: Cranial nerves grossly intact    Laboratory: Recent Labs  Lab 11/18/18 0502 11/19/18 0423 11/20/18 0402  WBC 5.5 5.5 6.3  HGB 8.4* 8.1* 7.8*  HCT 28.1* 27.4* 26.3*  PLT 188 192 193   Recent Labs  Lab 11/18/18 0502 11/19/18 0423 11/20/18 0402  NA 139 139 137  K 4.0 3.8 4.3  CL 99 99 100  CO2 25 27 26   BUN 21* 23* 29*  CREATININE 1.79* 2.11* 1.96*  CALCIUM 8.3* 8.9 8.7*  GLUCOSE 173* 158* 283*    Imaging/Diagnostic Tests: No results found.  Matilde Haymaker, MD 11/20/2018, 5:56 AM PGY-1, [place] Intern pager: [phone], text pages welcome

## 2018-11-20 NOTE — TOC Initial Note (Signed)
Transition of Care [facility]) - Initial/Assessment Note    Patient Details  Name: Gregory [name] MRN: [id number] Date of Birth: December 16, 1960  Transition [facility]) CM/SW Contact:    Alexander Mt, [place] Phone Number: 11/20/2018, 4:56 PM  Clinical Narrative:                 CSW briefly met with pt at bedside. Familiar with pt from previous visits at hospital. Introduced self, role, reason for visit. Pt states he does not want to talk to CSW for long. Inquired if pt from [place], he was able to state yes and that he will return at discharge. Confirmed his daughter is support system. Let him know Dr. Gavin Potters notes indicate possible dc tomorrow.   Pt aware. Declined further visit from [place] today.  Expected Discharge Plan: [place] Barriers to Discharge: Continued Medical Work up   Patient Goals and CMS Choice Patient states their goals for this hospitalization and ongoing recovery are:: go back to [facility]; have less pain CMS Medicare.gov Compare Post Acute Care list provided to:: Patient Choice offered to / list presented to : Patient  Expected Discharge Plan and Services Expected Discharge Plan: [place] In-house Referral: Clinical Social Work Discharge Planning Services: NA Post Acute Care Choice: [place] Living arrangements for the past 2 months: [place]                                      Prior Living Arrangements/Services Living arrangements for the past 2 months: [place] Lives with:: Facility Resident Patient language and need for interpreter reviewed:: Yes(no needs) Do you feel safe going back to the place where you live?: Yes      Need for Family Participation in Patient Care: No (Comment)(facility resident) Care giver support system in place?: Yes (comment)(SNF and adult daughter) Current home services: DME Criminal Activity/Legal Involvement Pertinent to Current  Situation/Hospitalization: No - Comment as needed  Activities of Daily Living Home Assistive Devices/Equipment: Wheelchair ADL Screening (condition at time of admission) Patient's cognitive ability adequate to safely complete daily activities?: Yes Is the patient deaf or have difficulty hearing?: No Does the patient have difficulty seeing, even when wearing glasses/contacts?: No Does the patient have difficulty concentrating, remembering, or making decisions?: No Patient able to express need for assistance with ADLs?: Yes Does the patient have difficulty dressing or bathing?: No Independently performs ADLs?: Yes (appropriate for developmental age) Does the patient have difficulty walking or climbing stairs?: Yes Weakness of Legs: Right Weakness of Arms/Hands: None  Permission Sought/Granted Permission sought to share information with : Facility Sport and exercise psychologist, Family Supports Permission granted to share information with : Yes, Verbal Permission Granted  Share Information with NAME: [name]  Permission granted to share info w AGENCY: [place] granted to share info w Relationship: daughter  Permission granted to share info w Contact Information: [phone]  Emotional Assessment Appearance:: Appears stated age Attitude/Demeanor/Rapport: Self-Absorbed Affect (typically observed): Overwhelmed Orientation: : Oriented to Self, Oriented to Place, Oriented to  Time, Oriented to Situation Alcohol / Substance Use: Not Applicable Psych Involvement: No (comment)  Admission diagnosis:  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 Cellulitis of right knee   . Lymphedema of left leg   . Diabetic polyneuropathy associated  with type 2 diabetes mellitus ([place])   . Wound infection 11/16/2018  . Below-knee amputation of right lower extremity ([place]) 10/22/2018   . Dehiscence of amputation stump ([place])   . 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[place]) 08/12/2018  . Candidiasis 08/12/2018  . Necrosis of toe ([place]) 07/08/2018  . Amputated toe of left foot ([place]) 06/18/2018  . Streptococcal infection   . Pressure injury of skin 06/06/2018  . Cutaneous abscess of left foot   . Moderate protein-calorie malnutrition ([place])   . Cellulitis of left leg   . Septic arthritis ([place]) 06/03/2018  . Idiopathic chronic venous hypertension of left lower extremity with ulcer and inflammation ([place]) 11/14/2017  . Great toe amputation status, left 11/14/2017  . Acquired absence of right leg below knee ([place]) 06/20/2017  . Osteomyelitis ([place]) 03/25/2017  . Skin cancer 03/25/2017  . Depression, recurrent ([place]) 11/05/2016  . Diabetic ulcer of toe of left foot ([place]) 10/02/2016  . 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 Chronic pain 09/27/2016  . Dyslipidemia associated with type 2 diabetes mellitus ([place]) 06/06/2016  . GERD without esophagitis 05/11/2016  . Hypertensive heart disease with congestive heart failure ([place]) 12/15/2015  . Spinal stenosis in cervical region 12/15/2015  . Type II diabetes mellitus with neurological manifestations ([place]) 12/15/2015  . Chronic diastolic CHF (congestive heart failure) ([place])   . Pulmonary hypertension ([place])   . Urinary retention   . Diskitis   . Foot drop, bilateral   . Sepsis with acute renal failure without septic shock ([place])   . MRSA bacteremia 10/03/2015  . COPD (chronic obstructive pulmonary disease) ([place]) 10/03/2015  . Acute osteomyelitis of left foot ([place]) 10/03/2015   PCP:  [name], MD Pharmacy:  No Pharmacies Listed    Social Determinants of Health (SDOH) Interventions    Readmission Risk Interventions Readmission Risk Prevention Plan 11/20/2018  Transportation Screening Complete  Medication Review ([place]) Complete  PCP or Specialist appointment within 3-5 days of discharge Complete  HRI or [place] Not Complete  HRI or Home Care Consult Pt Refusal Comments pt is LTC SNF resident  SW Recovery Care/Counseling Consult Complete  Palliative Care Screening Not [place] Complete  Some recent data might be hidden

## 2018-11-20 NOTE — Progress Notes (Signed)
Patient refused echo for third time.  Echocardiogram order will be cancelled.  Please reorder if patient agrees to testing.

## 2018-11-20 NOTE — Progress Notes (Signed)
Pt refusing to wear face mask.

## 2018-11-20 NOTE — Consult Note (Signed)
[place] Nurse wound consult note Patient receiving care in [place]. Reason for Consult: Left toe amputation routine care Wound type: NO wound present to left great toe amputation site. Pressure Injury POA: Yes/No/NA Wash the left foot with soap and water. Pat dry. Apply moisturizing lotion.  Thank you for the consult.  Discussed plan of care with the patient and bedside nurse.  [place] nurse will not follow at this time.  Please re-consult the [place] team if needed.  Val Riles, RN, MSN, CWOCN, CNS-BC, pager [phone]

## 2018-11-20 NOTE — Social Work (Signed)
CSW acknowledging consult for SNF placement. Pt is LTC resident at [facility], they are requesting a more recent COVID screen, will request MD place this order.    Westley Hummer, MSW, [place] Work [phone]

## 2018-11-20 NOTE — Progress Notes (Signed)
Patient ID: Gregory [name], male   DOB: 1960-08-03, 58 y.o.   MRN: [id number] Postoperative day 1 above-the-knee amputation.  Patient states his leg is sore.  There is no drainage in the wound VAC canister.  Anticipate patient would be able to discharge to home tomorrow Friday.

## 2018-11-20 NOTE — Progress Notes (Signed)
Pt has a COVID negative results as of four days ago but has a new active cough. Pt was re-tested today and results are pending.

## 2018-11-21 DIAGNOSIS — E440 Moderate protein-calorie malnutrition: Secondary | ICD-10-CM

## 2018-11-21 DIAGNOSIS — T148XXA Other injury of unspecified body region, initial encounter: Secondary | ICD-10-CM

## 2018-11-21 DIAGNOSIS — L089 Local infection of the skin and subcutaneous tissue, unspecified: Secondary | ICD-10-CM

## 2018-11-21 LAB — CULTURE, BLOOD (ROUTINE X 2)
Culture: NO GROWTH
Culture: NO GROWTH
Special Requests: ADEQUATE
Special Requests: ADEQUATE

## 2018-11-21 LAB — BASIC METABOLIC PANEL
Anion gap: 10 (ref 5–15)
BUN: 29 mg/dL — ABNORMAL HIGH (ref 6–20)
CO2: 28 mmol/L (ref 22–32)
Calcium: 8.6 mg/dL — ABNORMAL LOW (ref 8.9–10.3)
Chloride: 101 mmol/L (ref 98–111)
Creatinine, Ser: 2.03 mg/dL — ABNORMAL HIGH (ref 0.61–1.24)
GFR calc Af Amer: 40 mL/min — ABNORMAL LOW (ref >60)
GFR calc non Af Amer: 35 mL/min — ABNORMAL LOW (ref >60)
Glucose, Bld: 194 mg/dL — ABNORMAL HIGH (ref 70–99)
Potassium: 3.5 mmol/L (ref 3.5–5.1)
Sodium: 139 mmol/L (ref 135–145)

## 2018-11-21 LAB — CBC
HCT: 26.4 % — ABNORMAL LOW (ref 39.0–52.0)
Hemoglobin: 7.8 g/dL — ABNORMAL LOW (ref 13.0–17.0)
MCH: 24.8 pg — ABNORMAL LOW (ref 26.0–34.0)
MCHC: 29.5 g/dL — ABNORMAL LOW (ref 30.0–36.0)
MCV: 84.1 fL (ref 80.0–100.0)
Platelets: 197 10*3/uL (ref 150–400)
RBC: 3.14 MIL/uL — ABNORMAL LOW (ref 4.22–5.81)
RDW: 17.8 % — ABNORMAL HIGH (ref 11.5–15.5)
WBC: 6.5 10*3/uL (ref 4.0–10.5)
nRBC: 0 % (ref 0.0–0.2)

## 2018-11-21 LAB — GLUCOSE, CAPILLARY
Glucose-Capillary: 159 mg/dL — ABNORMAL HIGH (ref 70–99)
Glucose-Capillary: 204 mg/dL — ABNORMAL HIGH (ref 70–99)
Glucose-Capillary: 267 mg/dL — ABNORMAL HIGH (ref 70–99)
Glucose-Capillary: 329 mg/dL — ABNORMAL HIGH (ref 70–99)

## 2018-11-21 MED ORDER — OXYCODONE HCL 5 MG PO TABS
20.0000 mg | ORAL_TABLET | Freq: Four times a day (QID) | ORAL | Status: DC | PRN
  Administered 2018-11-21 – 2018-11-22 (×4): 20 mg via ORAL
  Filled 2018-11-21 (×4): qty 4

## 2018-11-21 MED ORDER — FUROSEMIDE 10 MG/ML IJ SOLN
40.0000 mg | Freq: Once | INTRAMUSCULAR | Status: AC
  Administered 2018-11-21: 40 mg via INTRAVENOUS
  Filled 2018-11-21: qty 4

## 2018-11-21 MED ORDER — HYDROMORPHONE HCL 1 MG/ML IJ SOLN
1.0000 mg | INTRAMUSCULAR | Status: DC | PRN
  Administered 2018-11-21 – 2018-11-22 (×4): 1 mg via INTRAVENOUS
  Filled 2018-11-21 (×5): qty 1

## 2018-11-21 MED ORDER — FUROSEMIDE 10 MG/ML IJ SOLN: 40.0000 mg | Freq: Once | INTRAMUSCULAR | Status: DC

## 2018-11-21 MED ORDER — FUROSEMIDE 40 MG PO TABS
40.0000 mg | ORAL_TABLET | Freq: Two times a day (BID) | ORAL | Status: DC
  Administered 2018-11-21: 40 mg via ORAL
  Filled 2018-11-21: qty 1

## 2018-11-21 MED ORDER — SENNA 8.6 MG PO TABS
1.0000 | ORAL_TABLET | Freq: Every day | ORAL | Status: DC
  Administered 2018-11-21 – 2018-11-22 (×2): 8.6 mg via ORAL
  Filled 2018-11-21 (×2): qty 1

## 2018-11-21 NOTE — Progress Notes (Signed)
Subjective: 2 Days Post-Op Procedure(s) (LRB): AMPUTATION ABOVE KNEE (Right) Patient reports pain as moderate and severe.    Objective: Vital signs in last 24 hours: Temp:  [97.5 F (36.4 C)-98.5 F (36.9 C)] 98.4 F (36.9 C) (06/05 0436) Pulse Rate:  [51-70] 64 (06/05 0843) Resp:  [19-20] 19 (06/05 0436) BP: (119-143)/(66-86) 143/74 (06/05 0843) SpO2:  [93 %-98 %] 95 % (06/05 0757)  Intake/Output from previous day: 06/04 0701 - 06/05 0700 In: 1080 [P.O.:1080] Out: 2550 [Urine:2550] Intake/Output this shift: Total I/O In: 240 [P.O.:240] Out: 400 [Urine:400]  Recent Labs    11/19/18 0423 11/20/18 0402 11/21/18 0325  HGB 8.1* 7.8* 7.8*   Recent Labs    11/20/18 0402 11/21/18 0325  WBC 6.3 6.5  RBC 3.15* 3.14*  HCT 26.3* 26.4*  PLT 193 197   Recent Labs    11/20/18 0402 11/21/18 0325  NA 137 139  K 4.3 3.5  CL 100 101  CO2 26 28  BUN 29* 29*  CREATININE 1.96* 2.03*  GLUCOSE 283* 194*  CALCIUM 8.7* 8.6*   No results for input(s): LABPT, INR in the last 72 hours.  VAC dressing intact over the right transtibial amputation site and functioning well. ~ 20 cc drainage in canister.    Assessment/Plan: 2 Days Post-Op Procedure(s) (LRB): AMPUTATION ABOVE KNEE (Right) Continue VAC therapy. DC with [place] VAC at discharge. Plan back to SNF over the next couple of days. Follow up in the office next week.    Erlinda Hong, [name] 11/21/2018, 11:09 AM  [place]

## 2018-11-21 NOTE — Progress Notes (Signed)
PT Cancellation Note  Patient Details Name: TORREN MAFFEO MRN: [id number] DOB: May 20, 1961   Cancelled Treatment:    Reason Eval/Treat Not Completed: Pain limiting ability to participate. Requesting PT to return after he receives pain meds.  Mabeline Caras, PT, DPT Acute Rehabilitation Services  Pager [phone] Office [place] 11/21/2018, 12:01 PM

## 2018-11-21 NOTE — Progress Notes (Addendum)
Family Medicine Teaching Service Daily Progress Note Intern Pager: [phone]  Patient name: Gregory [name] Medical record number: [id number] Date of birth: 09-26-1960 Age: 58 y.o. Gender: male  Primary Care Provider: [name], MD Consultants: Gregory [name] Code Status: Full  Pt Overview and Major Events to Date:  5/31-admission for infected surgical site of a right BKA amputation  Assessment and Plan: Gregory [name] is a 58 y.o. male presenting with infection at wound site from Gregory [name] from Gregory [name] May 6.. PMH is significant for HFpEF, diabetes, peripheral neuropathy, edema with stasis changes, history of multiple amputations with revisions, urinary difficulty, chronic pain, COPD, hypertension, hyperlipidemia, blindness in left eye, history of cancer.  Above-the-knee amputation Above-the-knee amputation performed on 6/3.  No complications.  Gregory [name] recommends 24 hours of antibiotics following surgery, wound VAC for 1 week, follow-up in clinic in 1 week.  Patient reports that he has been urinating and passing gas since the procedure.  No bowel movements yet. -Pain regimen includes: --MS Contin 15 mg every 12 hours --Oxycodone 20 mg every 6 hours. -PT/OT evaluate and treat -Docusate twice daily -miralax -Dulcolax as needed  History of HFpEF, stable Mild fluid overload noted on exam during hospitalization.  Patient should get diuresis with p.o. Lasix.  Refused echocardiogram 3 times.  Urine output 1.9 L in 6/5, net -6.3 L for this hospitalization. -Metoprolol tartrate 25 mg twice daily -Changes in to Lasix p.o. 40 mg twice daily -Strict I's and O's -Daily weights  Edema of the left leg Likely secondary to venous stasis in addition to heart failure.. -Diuresis as above  AKI Baseline creatinine 1.6.  Creatinine 1.9>2.0(6/5) -Diurese as above  Anemia, iron deficiency Iron 23, ferritin 22, iron saturation 9.  Hemoglobin 7.8 (6/5) from 8.1. -Ferrous sulfate 325 daily -Daily  CBC  Diabetes, type II A1c 7.7.  Holding home medications.  Blood glucose 159-204 in the past 24 hours. -Insulin sliding scale, sensitive  Nodule under right eye -Continue to monitor -Outpatient follow-up  Hypertension Blood pressures normotensive from admission. -Amlodipine 10 mg -Furosemide 40 mg daily -Metoprolol tartrate 25 mg twice daily  Chronic pain -Tylenol 650 every 6 hours -Baclofen 5 mg 3 times daily as needed -MS Contin 15 mg every 12 hours -Oxycodone IR 20 mg every 6 hours as needed -Pregabalin 75 mg twice daily  COPD, stable -Dulera -Incruse Ellipta  Urinary retention -Lasix -Oxybutynin -Finasteride -Flomax  Hyperlipidemia Total cholesterol 92, HDL 19, LDL 18 -Atorvastatin 20 mg daily  Depression -Paroxetine 20 mg daily -Wellbutrin daily  Tobacco use -Nicotine patch 7 mg daily  Allergies -Flonase -Loratadine  FEN/GI: NPO PPx: heparin  Disposition: Possible DC to Gregory [name] on 6/6  Subjective:  No acute events overnight.  Patient reports poor pain control overnight, significantly worse compared to the daytime.  He reports that he has not yet had a bowel movement since the surgery although he continues to urinate and pass gas.  He planned working with physical therapy on 6/4 although he agreed to cooperate with physical therapy today.  Specifically denies shortness of breath today and reports that he thinks lower extremity edema is improving.  Objective: Temp:  [97.5 F (36.4 C)-98.5 F (36.9 C)] 98.4 F (36.9 C) (06/05 0436) Pulse Rate:  [51-70] 51 (06/05 0436) Resp:  [19-20] 19 (06/05 0436) BP: (119-129)/(66-86) 119/66 (06/05 0436) SpO2:  [93 %-98 %] 96 % (06/05 0436)  Physical Exam: General: Alert and cooperative and appears to be in no acute distress HEENT: Neck non-tender without  lymphadenopathy, masses or thyromegaly Cardio: Distant heart sounds, strong radial pulse. Pulm: Rales noted in lower fields bilaterally.  No wheezing or  stridor noted.  Breathing comfortably on room air.  Completing long sentences without issue. Abdomen: Bowel sounds normal. Abdomen soft and non-tender.  Extremities:  Strong radial pulse.  Continued lower extremity edema noted up to the knee.  Mild improvement compared to previous exam. Neuro: Cranial nerves grossly intact   Laboratory: Recent Labs  Lab 11/19/18 0423 11/20/18 0402 11/21/18 0325  WBC 5.5 6.3 6.5  HGB 8.1* 7.8* 7.8*  HCT 27.4* 26.3* 26.4*  PLT 192 193 197   Recent Labs  Lab 11/19/18 0423 11/20/18 0402 11/21/18 0325  NA 139 137 139  K 3.8 4.3 3.5  CL 99 100 101  CO2 27 26 28   BUN 23* 29* 29*  CREATININE 2.11* 1.96* 2.03*  CALCIUM 8.9 8.7* 8.6*  GLUCOSE 158* 283* 194*    Imaging/Diagnostic Tests: No results found.  Gregory Haymaker, MD 11/21/2018, 6:43 AM PGY-1, [place] Intern pager: [phone], text pages welcome

## 2018-11-21 NOTE — Evaluation (Signed)
Physical Therapy Evaluation & Discharge Patient Details Name: Gregory [name] MRN: [id number] DOB: Jul 08, 1960 Today's Date: 11/21/2018   History of Present Illness  Pt is a 58 y.o. male admitted from [place] at [facility] on 11/16/18 with R BKA infection. Now s/p R AKA 6/3. PMH includes HF, DM, peripheral neuropathy, COPD, chronic pain, L eye blindness, h/o multiple amputations with revisions (most recently 10/22/18).    Clinical Impression  Patient evaluated by Physical Therapy with no further acute PT needs identified. PTA, pt LTC resident at [facility]; reports indep with transfers to wheelchair, staff assists as needed. Today, pt mod indep with pivot transfer to recliner. All education has been completed and the patient has no further questions. Pt understands importance of R residual limb elevation, BLE mobility and pressure relief (although he does not use tilt function of his w/c). Acute PT is signing off. Thank you for this referral.   Follow Up Recommendations (return to LTC at [facility])    Equipment Recommendations  None recommended by PT    Recommendations for Other Services       Precautions / Restrictions Precautions Precautions: Fall;Other (comment) Precaution Comments: R AKA with wound vac      Mobility  Bed Mobility Overal bed mobility: Modified Independent             General bed mobility comments: Mod indep with HOB elevated and use of bed rail  Transfers Overall transfer level: Modified independent Equipment used: None Transfers: Squat Pivot Transfers     Squat pivot transfers: Modified independent (Device/Increase time)     General transfer comment: Pt only requiring assist for transfer set-up. Mod indep to perform squat pivot transfer from bed to drop arm recliner towards L-side   Ambulation/Gait             General Gait Details: Does not ambulate at baseline  Stairs            Wheelchair Mobility    Modified Rankin (Stroke Patients Only)        Balance Overall balance assessment: Mild deficits observed, not formally tested                                           Pertinent Vitals/Pain Pain Assessment: Faces Faces Pain Scale: Hurts little more Pain Location: R AKA site Pain Descriptors / Indicators: Sore;Guarding Pain Intervention(s): Patient requesting pain meds-RN notified;Repositioned    Home Living Family/patient expects to be discharged to:: Skilled nursing facility                 Additional Comments: Resides at [facility]    Prior Function Level of Independence: Needs assistance   Gait / Transfers Assistance Needed: Pt reports indep to stand on LLE and transfer to his tilt w/c. Does not perform tilting (  ADL's / Homemaking Assistance Needed: Reports can dress/bathe self- goes into the shower        Hand Dominance        Extremity/Trunk Assessment   Upper Extremity Assessment Upper Extremity Assessment: Overall WFL for tasks assessed    Lower Extremity Assessment Lower Extremity Assessment: RLE deficits/detail RLE Deficits / Details: s/p R AKA; hip flex >3/5       Communication      Cognition Arousal/Alertness: Awake/alert Behavior During Therapy: WFL for tasks assessed/performed Overall Cognitive Status: Within Functional Limits for tasks assessed  General Comments      Exercises     Assessment/Plan    PT Assessment Patent does not need any further PT services  PT Problem List         PT Treatment Interventions      PT Goals (Current goals can be found in the Care Plan section)  Acute Rehab PT Goals PT Goal Formulation: All assessment and education complete, DC therapy    Frequency     Barriers to discharge        Co-evaluation               AM-PAC PT "6 Clicks" Mobility  Outcome Measure Help needed turning from your back to your side while in a flat bed without using  bedrails?: None Help needed moving from lying on your back to sitting on the side of a flat bed without using bedrails?: None Help needed moving to and from a bed to a chair (including a wheelchair)?: None Help needed standing up from a chair using your arms (e.g., wheelchair or bedside chair)?: A Lot Help needed to walk in hospital room?: Total Help needed climbing 3-5 steps with a railing? : Total 6 Click Score: 16    End of Session   Activity Tolerance: Patient tolerated treatment well;Patient limited by pain Patient left: in chair;with call bell/phone within reach Nurse Communication: Mobility status PT Visit Diagnosis: Other abnormalities of gait and mobility (R26.89)    Time: 9509-3267 PT Time Calculation (min) (ACUTE ONLY): 17 min   Charges:   PT Evaluation $PT Eval Moderate Complexity: [place], PT, DPT Acute Rehabilitation Services  Pager [phone] Office [place] 11/21/2018, 2:15 PM

## 2018-11-21 NOTE — Progress Notes (Signed)
Inpatient Diabetes Program Recommendations  AACE/ADA: New Consensus Statement on Inpatient Glycemic Control (2015)  Target Ranges:  Prepandial:   less than 140 mg/dL      Peak postprandial:   less than 180 mg/dL (1-2 hours)      Critically ill patients:  140 - 180 mg/dL   Lab Results  Component Value Date   GLUCAP 204 (H) 11/21/2018   HGBA1C 7.7 (H) 11/17/2018    Review of Glycemic Control Results for Gregory [name], Gregory [name] (MRN [id number]) as of 11/21/2018 14:22  Ref. Range 11/20/2018 11:27 11/20/2018 16:33 11/20/2018 21:05 11/21/2018 07:33 11/21/2018 11:38  Glucose-Capillary Latest Ref Range: 70 - 99 mg/dL 200 (H) 184 (H) 190 (H) 159 (H) 204 (H)  Diabetes history: DM 2 Outpatient Diabetes medications: Glipizide Daily, Humalog 0-10 units tid Current orders for Inpatient glycemic control:  Novolog 0-9 units tid  Inpatient Diabetes Program Recommendations:    Please consider adding Novolog meal coverage 3 units tid with meals.   Thanks,  Adah Perl, RN, BC-ADM Inpatient Diabetes Coordinator Pager [phone] (8a-5p)

## 2018-11-22 LAB — GLUCOSE, CAPILLARY
Glucose-Capillary: 178 mg/dL — ABNORMAL HIGH (ref 70–99)
Glucose-Capillary: 255 mg/dL — ABNORMAL HIGH (ref 70–99)

## 2018-11-22 MED ORDER — OXYCODONE HCL 10 MG PO TABS: 10.0000 mg | ORAL_TABLET | Freq: Four times a day (QID) | ORAL | 0 refills | Status: AC | PRN

## 2018-11-22 MED ORDER — SENNA 8.6 MG PO TABS: 2.0000 | ORAL_TABLET | Freq: Every day | ORAL | Status: DC

## 2018-11-22 MED ORDER — FERROUS SULFATE 325 (65 FE) MG PO TABS: 325.0000 mg | ORAL_TABLET | Freq: Every day | ORAL | 0 refills | Status: DC

## 2018-11-22 MED ORDER — BACLOFEN 5 MG PO TABS: 5.0000 mg | ORAL_TABLET | Freq: Three times a day (TID) | ORAL | 0 refills | Status: DC | PRN

## 2018-11-22 NOTE — Progress Notes (Addendum)
CSW received call from Dr. Pilar Plate regarding this patient's plan for discharge back to [facility]. CSW will arrange for transportation and return to [facility].  Madilyn Fireman, MSW, LCSW-A Clinical Social Worker [facility] [phone]

## 2018-11-22 NOTE — Progress Notes (Addendum)
Attempted to call report to [facility] x 3 with no answer. RN's number written on paper and given to PTAR to give to facility so they can call with any questions. Wound vac changed over to prevena at discharge per order, no issues. All belongings gathered to be sent with pt. Pt in no distress at discharge.  Received call from [place] at [facility], report given, all questions answered.

## 2018-11-22 NOTE — Discharge Summary (Signed)
[facility] Discharge Summary  Patient name: Gregory [name] Medical record number: [id number] Date of birth: 10/22/1960 Age: 58 y.o. Gender: male Date of Admission: 11/16/2018  Date of Discharge: 11/22/2018 Admitting Physician: Alveda Reasons, MD  Primary Care Provider: [name], MD Consultants: orthopedic surgery  Indication for Hospitalization: surgical site infection/wound dehiscence  Discharge Diagnoses/Problem List:  Above-the-knee amputation History of half path Edema of left leg AKI Anemia, iron deficiency Diabetes Nodule under right eye Chronic pain COPD Urinary retention Hyperlipidemia Tobacco use Allergies  Disposition: Discharge to LTAC  Discharge Condition: Stable  Discharge Exam:  General: Alert and cooperative and appears to be in no acute distress.  Resting in bed comfortably eating breakfast and watching TV. Cardio: Normal S1 and S2, no S3 or S4. Rhythm is regular. No murmurs or rubs.   Pulm: Breathing comfortably on room air.  Mild rales heard in lower fields bilaterally.  No wheezing/stridor. Abdomen: Bowel sounds normal. Abdomen soft and non-tender.  Extremities: Right leg with wound VAC, no signs of erythema above bandaging.  Left leg with 2+ pitting edema and venous stasis changes up to the tibial tuberosity.  Significantly improved from previous exam. Neuro: Cranial nerves grossly intact   Brief Hospital Course:  Gregory [name] a 58 y.o.malepresenting with infection at wound site from [place] from [name] May 6.. PMH is significant forHFpEF,diabetes,peripheral neuropathy,edema with stasis changes,history of multiple amputations with revisions,urinary difficulty,chronic pain,COPD,hypertension,hyperlipidemia,blindness in left eye,history of cancer.  Surgical site infection with dehiscence Gregory [name] presented to the ED with a surgical site infection that apparently began after falling on his amputated right  leg.  On admission, he was put on Vanco and Zosyn.  He subsequently failed outpatient antibiotic therapy by orthopedics.  He was seen by orthopedics, [name], while inpatient.  On 6/3, a successful above-the-knee amputation was performed without complication.  He remained on Vanco and Zosyn for 24 hours following his surgery.  [name] recommended keeping the wound VAC in place for at least 1 week following the surgery to follow-up with him in his clinic.  Following surgery, he was able to demonstrate good p.o. intake and urine output.  He did demonstrate flatus though he had not yet had a postoperative bowel movement at the time of discharge.  On 6/6 he is found to be medically stable and discharged to his LTAC.  Fluid overload, lower extremity edema, heart failure On 6/2, he was noted to have increasing left leg edema up to the mid thigh and rales on auscultation of the lungs.  Doppler studies revealed that there is no evidence of DVT.  His home Lasix regimen was doubled for 2 days with market improvement in his left leg edema.  This fluid overload may have been related to the ongoing infection prior to surgery in addition to IV fluids from antibiotics.  Is found to be euvolemic on exam prior to discharge.  He was discharged on his home Lasix regimen of p.o. Lasix 40 daily.  Issues for Follow Up:  1. Follow-up with orthopedic surgery in 1 week 2. Oxycodone 10 mg every 4 hours as needed for 7 days for postsurgical pain.  This is added on top of his home pain regimen. 3. Continue senna 2 pills daily until he he resumes regular daily bowel movements.  Significant Procedures:  6/3 - above-the-knee amputation, no complications.  Significant Labs and Imaging:  Recent Labs  Lab 11/19/18 0423 11/20/18 0402 11/21/18 0325  WBC 5.5  6.3 6.5  HGB 8.1* 7.8* 7.8*  HCT 27.4* 26.3* 26.4*  PLT 192 193 197   Recent Labs  Lab 11/17/18 0406 11/18/18 0502 11/19/18 0423 11/20/18 0402 11/21/18 0325  NA 136  139 139 137 139  K 4.0 4.0 3.8 4.3 3.5  CL 99 99 99 100 101  CO2 27 25 27 26 28   GLUCOSE 185* 173* 158* 283* 194*  BUN 23* 21* 23* 29* 29*  CREATININE 1.80* 1.79* 2.11* 1.96* 2.03*  CALCIUM 8.6* 8.3* 8.9 8.7* 8.6*    Dg Knee Complete 4 Views Right  Result Date: 11/16/2018 CLINICAL DATA:  Infection and pain at amputation site below the right knee. Reported fall. EXAM: RIGHT KNEE - COMPLETE 4+ VIEW COMPARISON:  10/20/2018 right lower extremity radiographs FINDINGS: Status post right lower extremity amputation at the level of the proximal tibial shaft. New soft tissue swelling throughout amputation site. New soft tissue defect overlying the tibial stump. Curvilinear lucency at the anterolateral margin of the right tibial stump suggests a nondisplaced fracture. No new osseous erosions or new periosteal reaction. No suspicious focal osseous lesions. No right knee joint effusion. No radiopaque foreign body. IMPRESSION: New curvilinear lucency at the anterolateral margin of the right tibial stump, suggesting a nondisplaced fracture. New soft tissue swelling throughout the amputation site. New soft tissue defect overlying the tibial stump. No specific radiographic findings of acute osteomyelitis. Electronically Signed   By: Ilona Sorrel M.D.   On: 11/16/2018 15:09   Vas [place] Lower Extremity Venous (dvt)  Result Date: 11/18/2018  Lower Venous Study Indications: Edema, and Swelling.  Limitations: Poor ultrasound/tissue interface. Performing Technologist: Abram Sander RVS  Examination Guidelines: A complete evaluation includes B-mode imaging, spectral Doppler, color Doppler, and power Doppler as needed of all accessible portions of each vessel. Bilateral testing is considered an integral part of a complete examination. Limited examinations for reoccurring indications may be performed as noted.  +-----+---------------+---------+-----------+----------+-------+  RIGHTCompressibilityPhasicitySpontaneityPropertiesSummary +-----+---------------+---------+-----------+----------+-------+ CFV  Full           Yes      Yes                          +-----+---------------+---------+-----------+----------+-------+   +---------+---------------+---------+-----------+----------+--------------+ LEFT     CompressibilityPhasicitySpontaneityPropertiesSummary        +---------+---------------+---------+-----------+----------+--------------+ CFV      Full                                                        +---------+---------------+---------+-----------+----------+--------------+ SFJ      Full                                                        +---------+---------------+---------+-----------+----------+--------------+ FV Prox  Full                                                        +---------+---------------+---------+-----------+----------+--------------+ FV Mid   Full                                                        +---------+---------------+---------+-----------+----------+--------------+  FV DistalFull                                                        +---------+---------------+---------+-----------+----------+--------------+ PFV      Full                                                        +---------+---------------+---------+-----------+----------+--------------+ POP      Full           Yes      Yes                                 +---------+---------------+---------+-----------+----------+--------------+ PTV                                                   Not visualized +---------+---------------+---------+-----------+----------+--------------+ PERO                                                  Not visualized +---------+---------------+---------+-----------+----------+--------------+     Summary: Right: No evidence of common femoral vein obstruction. Left: There is no  evidence of deep vein thrombosis in the lower extremity. However, portions of this examination were limited- see technologist comments above. No cystic structure found in the popliteal fossa.  *See table(s) above for measurements and observations. Electronically signed by Deitra Mayo MD on 11/18/2018 at 12:53:12 PM.    Final      Results/Tests Pending at Time of Discharge: none  Discharge Medications:  Allergies as of 11/22/2018   No Known Allergies     Medication List    STOP taking these medications   levofloxacin 750 MG tablet Commonly known as:  Levaquin     TAKE these medications   acetaminophen 325 MG tablet Commonly known as:  TYLENOL Take 650 mg by mouth every 4 (four) hours as needed (general discomfort).   albuterol 108 (90 Base) MCG/ACT inhaler Commonly known as:  VENTOLIN HFA Inhale 2 puffs into the lungs every 6 (six) hours as needed for wheezing or shortness of breath.   amLODipine 10 MG tablet Commonly known as:  NORVASC Take 10 mg by mouth daily. for HTN   atorvastatin 20 MG tablet Commonly known as:  LIPITOR Take 20 mg by mouth at bedtime.   Baclofen 5 MG Tabs Take 5 mg by mouth 3 (three) times daily as needed for muscle spasms.   bisacodyl 10 MG suppository Commonly known as:  DULCOLAX Place 1 suppository (10 mg total) rectally daily as needed for moderate constipation.   buPROPion 300 MG 24 hr tablet Commonly known as:  WELLBUTRIN XL Take 300 mg by mouth at bedtime.   calcium carbonate 500 MG chewable tablet Commonly known as:  TUMS - dosed in mg elemental calcium Chew 2 tablets by mouth every 8 (eight) hours as needed for indigestion  or heartburn.   cetirizine 10 MG tablet Commonly known as:  ZYRTEC Take 10 mg by mouth daily.   CETYLPYRIDINIUM CHLORIDE MT Use as directed 15 mLs in the mouth or throat every 4 (four) hours as needed (dry mouth).   eszopiclone 1 MG Tabs tablet Commonly known as:  LUNESTA Take 1 mg by mouth at bedtime.  Take immediately before bedtime   ferrous sulfate 325 (65 FE) MG tablet Take 1 tablet (325 mg total) by mouth daily with breakfast. Start taking on:  November 23, 2018   finasteride 5 MG tablet Commonly known as:  PROSCAR Take 5 mg by mouth daily.   furosemide 40 MG tablet Commonly known as:  LASIX Take 40 mg by mouth daily.   glipiZIDE 10 MG tablet Commonly known as:  GLUCOTROL Take 10 mg by mouth daily before breakfast.   Glucagon Emergency 1 MG injection Generic drug:  glucagon Inject 1 mg into the muscle once as needed (for CBG <70).   guaiFENesin 600 MG 12 hr tablet Commonly known as:  MUCINEX Take 600 mg by mouth every 12 (twelve) hours as needed (congestion).   HumaLOG KwikPen 100 UNIT/ML KwikPen Generic drug:  insulin lispro Inject 0-10 Units into the skin See admin instructions. Inject 0-10 units per sliding scale 3 times daily before meals; CBG <70 = notify MD, 0-150 = 0 units, 151-200 = 2 units, 201-250 = 4 units, 251-300 = 6 units, 301-350 = 8 units, 351-400 = 10 units, >400 = notify MD   lidocaine 5 % Commonly known as:  LIDODERM Place 1 patch onto the skin daily. Apply to Right stump. Remove & Discard patch within 12 hours or as directed by MD   magnesium oxide 400 MG tablet Commonly known as:  MAG-OX Take 400 mg by mouth 2 (two) times daily.   metoprolol tartrate 50 MG tablet Commonly known as:  LOPRESSOR Take 50 mg by mouth daily.   mirabegron ER 50 MG Tb24 tablet Commonly known as:  MYRBETRIQ Take 50 mg by mouth daily.   morphine 15 MG 12 hr tablet Commonly known as:  MS CONTIN Take 1 tablet (15 mg total) by mouth every 12 (twelve) hours.   Nasonex 50 MCG/ACT nasal spray Generic drug:  mometasone Place 2 sprays into the nose at bedtime.   nicotine 14 mg/24hr patch Commonly known as:  NICODERM CQ - dosed in mg/24 hours Place 14 mg onto the skin daily.   omeprazole 40 MG capsule Commonly known as:  PRILOSEC Take 40 mg by mouth daily before  breakfast.   ondansetron 4 MG tablet Commonly known as:  ZOFRAN Take 4 mg by mouth every 6 (six) hours as needed for nausea or vomiting.   oxybutynin 15 MG 24 hr tablet Commonly known as:  DITROPAN XL Take 15 mg by mouth at bedtime.   Oxycodone HCl 20 MG Tabs Take 1 tablet (20 mg total) by mouth every 6 (six) hours as needed (pain). What changed:  Another medication with the same name was added. Make sure you understand how and when to take each.   Oxycodone HCl 10 MG Tabs Take 1 tablet (10 mg total) by mouth every 6 (six) hours as needed for up to 7 days (for severe surgical pain). What changed:  You were already taking a medication with the same name, and this prescription was added. Make sure you understand how and when to take each.   PARoxetine 20 MG tablet Commonly known as:  PAXIL Take 20  mg by mouth at bedtime.   polyethylene glycol 17 g packet Commonly known as:  MIRALAX / GLYCOLAX Take 17 g by mouth daily as needed for mild constipation.   pregabalin 150 MG capsule Commonly known as:  LYRICA Take 1 capsule (150 mg total) by mouth 2 (two) times daily.   senna-docusate 8.6-50 MG tablet Commonly known as:  Senokot-S Take 2 tablets by mouth at bedtime.   silver sulfADIAZINE 1 % cream Commonly known as:  SILVADENE Apply 1 application topically See admin instructions. Apply to left toes every shift for wound care   Spiriva Respimat 1.25 MCG/ACT Aers Generic drug:  Tiotropium Bromide Monohydrate Inhale 2 puffs into the lungs daily.   tamsulosin 0.4 MG Caps capsule Commonly known as:  FLOMAX Take 0.8 mg by mouth daily at 6 PM.   terbinafine 250 MG tablet Commonly known as:  LAMISIL Take 250 mg by mouth daily.   tiZANidine 4 MG tablet Commonly known as:  ZANAFLEX Take 4 mg by mouth every 8 (eight) hours as needed for muscle spasms.   vitamin B-12 500 MCG tablet Commonly known as:  CYANOCOBALAMIN Take 1,000 mcg by mouth daily.   Vitamin D-3 125 MCG (5000 UT)  Tabs Take 5,000 Units by mouth daily.       Discharge Instructions: Please refer to Patient Instructions section of EMR for full details.  Patient was counseled important signs and symptoms that should prompt return to medical care, changes in medications, dietary instructions, activity restrictions, and follow up appointments.   Follow-Up Appointments: Follow-up Information    [name], MD.   Specialty:  Internal Medicine Contact information: [place] [address] [phone]        [name], MD. Schedule an appointment as soon as possible for a visit in 1 week(s).   Specialty:  Orthopedic Surgery Contact information: [place] [address] [phone]           Matilde Haymaker, MD 11/22/2018, 8:51 PM PGY-1, [place]

## 2018-11-22 NOTE — Plan of Care (Signed)
  Problem: Pain Managment: Goal: General experience of comfort will improve Outcome: Progressing   Problem: Safety: Goal: Ability to remain free from injury will improve Outcome: Progressing   Problem: Skin Integrity: Goal: Risk for impaired skin integrity will decrease Outcome: Progressing   

## 2018-11-22 NOTE — Progress Notes (Signed)
Educated patient on liquids that he was drinking that are high in sugar. Patient agreed to drink diet coke with meds this AM. Will continue to monitor and educate patient on diabetic diet.

## 2018-11-22 NOTE — Progress Notes (Signed)
CBG was 329. Patient drinking regular coke with meds and at other times. Educated patient on diabetic diet and concentrated sweets. Patient refused to have CBG rechecked. Will continue to monitor patient.

## 2018-11-22 NOTE — TOC Transition Note (Signed)
Transition of Care [facility]) - CM/SW Discharge Note   Patient Details  Name: Gregory [name] MRN: [id number] Date of Birth: April 12, 1961  [facility]) CM/SW Contact:  Archie Endo, LCSW Phone Number: 11/22/2018, 1:22 PM   Clinical Narrative:     CSW spoke with patient's MD Dr. Pilar Plate to discuss his discharge, patient is medically ready for return to [facility]. CSW spoke with patient's RN Sharyn Lull and CSW and RN agreed to transport patient at 3pm today. CSW spoke with Debbie at [facility] who confirmed that patient can return there today. The room number will be 155 and the number to call for report is [phone]. Patient requires no special equipment only a prevena wound vac. CSW arranged for transport to [facility] at 3pm via [facility].  Final next level of care: [place] Barriers to Discharge: No Barriers Identified   Patient Goals and CMS Choice Patient states their goals for this hospitalization and ongoing recovery are:: go back to [facility]; have less pain CMS Medicare.gov Compare Post Acute Care list provided to:: Patient Choice offered to / list presented to : Patient  Discharge Placement              Patient chooses bed at: [facility]) Patient to be transferred to facility by: [facility] Name of family member notified: [name] Patient and family notified of of transfer: 11/22/18  Discharge Plan and Services In-house Referral: Clinical Social Work Discharge Planning Services: NA Post Acute Care Choice: [place]                               Social Determinants of Health (SDOH) Interventions     Readmission Risk Interventions Readmission Risk Prevention Plan 11/20/2018  Transportation Screening Complete  Medication Review [employer]) Complete  PCP or Specialist appointment within 3-5 days of discharge Complete  HRI or [place] Not Complete  HRI or Home Care Consult Pt Refusal Comments  pt is LTC SNF resident  SW Recovery Care/Counseling Consult Complete  Palliative Care Screening Not Applicable  Skilled Nursing Facility Complete  Some recent data might be hidden

## 2018-11-22 NOTE — Discharge Instructions (Signed)
While you were in the hospital, you had above-the-knee amputation for you surgical site infection and dehiscence.  You need to follow up with [name] in his clinic one week following the surgery (you will be contacted by his office).   You have been given additional pain medication for the next week to help with surgical pain. You were also treated for your left leg edema which improved with lasix.  I do not think that you need more lasix moving forward.    You were found to have some low blood levels while you were here and were started on iron pills. Continue taking these pills and make sure to have your iron checked in the next 1-3 months.

## 2018-11-25 ENCOUNTER — Ambulatory Visit (INDEPENDENT_AMBULATORY_CARE_PROVIDER_SITE_OTHER): Payer: Medicaid Other | Admitting: Orthopedic Surgery

## 2018-11-25 ENCOUNTER — Encounter: Payer: Self-pay | Admitting: Physician Assistant

## 2018-11-25 ENCOUNTER — Other Ambulatory Visit: Payer: Self-pay

## 2018-11-25 VITALS — Ht 72.0 in | Wt 245.0 lb

## 2018-11-25 DIAGNOSIS — Z89611 Acquired absence of right leg above knee: Secondary | ICD-10-CM

## 2018-11-25 DIAGNOSIS — S78111A Complete traumatic amputation at level between right hip and knee, initial encounter: Secondary | ICD-10-CM

## 2018-11-25 NOTE — Progress Notes (Signed)
Office Visit Note   Patient: Gregory [name]           Date of Birth: 1961/06/12           MRN: [id number] Visit Date: 11/25/2018              Requested by: [name], MD [place], [place] [address] PCP: [name], MD  Chief Complaint  Patient presents with  . Right Leg - Routine Post Op    11/19/2018 right AKA      HPI: Patient is a 58 year old gentleman who is about a week out from a right above-the-knee amputation.  He is currently at skilled nursing the wound VAC was discontinued yesterday.  Patient states there is no drainage in the wound VAC canister.  Assessment & Plan: Visit Diagnoses:  1. Unilateral AKA, right ([name])     Plan: Patient will start Dial soap cleansing daily he was given a prescription for a stump shrinker for the right with biotech he will be a K2 level ambulator with a above-the-knee prosthesis.  Harvest sutures and staples at follow-up.  Follow-Up Instructions: Return in about 2 weeks (around 12/09/2018).   Ortho Exam  Patient is alert, oriented, no adenopathy, well-dressed, normal affect, normal respiratory effort. Examination the incision is healing nicely there is no maceration no cellulitis no drainage no signs of infection.  Imaging: No results found.   Labs: Lab Results  Component Value Date   HGBA1C 7.7 (H) 11/17/2018   HGBA1C 8.0 (H) 06/04/2018   HGBA1C 7.9 (H) 04/25/2018   ESRSEDRATE 120 (H) 03/25/2017   ESRSEDRATE 84 (H) 10/02/2016   ESRSEDRATE 132 (H) 12/01/2015   CRP 4.9 (H) 03/25/2017   CRP 30.6 (H) 10/02/2016   CRP 13.7 (H) 11/11/2015   REPTSTATUS 11/17/2018 FINAL 11/16/2018   GRAMSTAIN  06/06/2018    RARE WBC PRESENT, PREDOMINANTLY PMN FEW GRAM POSITIVE COCCI    CULT  11/16/2018    NO GROWTH Performed at [facility] [address] [place], [place] [address]    LABORGA PROVIDENCIA [name] (A) 01/12/2017     Lab Results  Component Value Date   ALBUMIN 3.3 (L) 10/22/2018   ALBUMIN 2.2 (L) 06/04/2018   ALBUMIN 2.8 (L) 06/03/2018   PREALBUMIN 23.3 03/25/2017    Body mass index is 33.23 kg/m.  Orders:  No orders of the defined types were placed in this encounter.  No orders of the defined types were placed in this encounter.    Procedures: No procedures performed  Clinical Data: No additional findings.  ROS:  All other systems negative, except as noted in the HPI. Review of Systems  Objective: Vital Signs: Ht 6' (1.829 m)   Wt 245 lb (111.1 kg)   BMI 33.23 kg/m   Specialty Comments:  No specialty comments available.  PMFS History: Patient Active Problem List   Diagnosis Date Noted  . Amputation stump infection (Milwaukee)   . Lymphedema of left leg   . Diabetic polyneuropathy associated with type 2 diabetes mellitus (Plymouth)   . Wound infection 11/16/2018  . Below-knee amputation of right lower extremity (Citronelle) 10/22/2018  . Dehiscence of amputation stump (Henderson)   . 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Snyder) 08/12/2018  . Candidiasis 08/12/2018  . Necrosis of toe (Brewster) 07/08/2018  . Amputated toe of left foot (Walton) 06/18/2018  . Streptococcal infection   . Pressure injury of skin 06/06/2018  . Cutaneous abscess of left foot   . Moderate  protein-calorie malnutrition (Rowesville)   . Cellulitis of left leg   . Septic arthritis (Murray) 06/03/2018  . Idiopathic chronic venous hypertension of left lower extremity with ulcer and inflammation (Avoca) 11/14/2017  . Great toe amputation status, left 11/14/2017  . Acquired absence of right leg below knee (Lordsburg) 06/20/2017  . Osteomyelitis (Waco) 03/25/2017  . Skin cancer 03/25/2017  . Depression, recurrent (Ambler) 11/05/2016  . Diabetic ulcer of toe of left foot (South Fork Estates) 10/02/2016  . 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 Chronic pain 09/27/2016  . Dyslipidemia associated with type 2 diabetes mellitus (Los Alamos) 06/06/2016  . GERD without esophagitis 05/11/2016  . Hypertensive heart disease with congestive heart failure  (Rose Hill Acres) 12/15/2015  . Spinal stenosis in cervical region 12/15/2015  . Type II diabetes mellitus with neurological manifestations (Washington) 12/15/2015  . Chronic diastolic CHF (congestive heart failure) (El Rito)   . Pulmonary hypertension (Nanticoke)   . Urinary retention   . Diskitis   . Foot drop, bilateral   . Sepsis with acute renal failure without septic shock (Sedley)   . MRSA bacteremia 10/03/2015  . COPD (chronic obstructive pulmonary disease) (Lemannville) 10/03/2015  . Acute osteomyelitis of left foot (Negaunee) 10/03/2015   Past Medical History:  Diagnosis Date  . Acquired absence of left great toe (Soda Springs)   . Acquired absence of right leg below knee (Westchester)   . Acute osteomyelitis of calcaneum, right (Ewa Beach) 10/02/2016  . Acute respiratory failure (La Riviera)   . Amputation stump infection (Apple Grove) 08/12/2018  . Anemia   . Basal cell carcinoma, eyelid   . Cancer (Edison)   . Candidiasis 08/12/2018  . CHF (congestive heart failure) (HCC)    chronic diastolic   . Chronic back pain   . Chronic kidney disease    stage 3   . Chronic multifocal osteomyelitis (HCC)    of ankle and foot   . Chronic pain syndrome   . COPD (chronic obstructive pulmonary disease) (McKinley)   . DDD (degenerative disc disease), lumbar   . Depression    major depressive disorder   . Diabetes mellitus without complication (Tucson)    type 2   . Diabetic ulcer of toe of left foot (Cumberland Head) 10/02/2016  . Difficulty in walking, not elsewhere classified   . 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 Foot drop, bilateral   . Foot drop, right 12/27/2015  . GERD (gastroesophageal reflux disease)   . Herpesviral vesicular dermatitis   . Hyperlipidemia   . Hyperlipidemia   . Hypertension   . Insomnia   . Malignant melanoma of other parts of face (Vienna)   . MRSA bacteremia   . Muscle weakness (generalized)   . Nasal congestion   . Necrosis of toe (Montgomery) 07/08/2018  . Neuromuscular dysfunction of bladder   . Osteomyelitis (Cottonwood)   . Osteomyelitis (Love Valley)   . Osteomyelitis  (Sauk Rapids)    right BKA  . Other idiopathic peripheral autonomic neuropathy   . Retention of urine, unspecified   . Squamous cell carcinoma of skin   . Unsteadiness on feet   . Urinary retention   . Urinary retention   . Urinary tract infection   . Wears dentures   . Wears glasses     Family History  Problem Relation Age of Onset  . Cancer Other   . Diabetes Other     Past Surgical History:  Procedure Laterality Date  . AMPUTATION Left 03/29/2017   Procedure: LEFT GREAT TOE AMPUTATION AT METATARSOPHALANGEAL JOINT;  Surgeon: [name], MD;  Location: [facility];  Service: Orthopedics;  Laterality: Left;  . AMPUTATION Right 03/29/2017   Procedure: RIGHT BELOW KNEE AMPUTATION;  Surgeon: [name], MD;  Location: [facility];  Service: Orthopedics;  Laterality: Right;  . AMPUTATION Left 06/06/2018   Procedure: LEFT 2ND TOE AMPUTATION;  Surgeon: [name], MD;  Location: [facility];  Service: Orthopedics;  Laterality: Left;  . AMPUTATION Right 11/19/2018   Procedure: AMPUTATION ABOVE KNEE;  Surgeon: [name], MD;  Location: [facility];  Service: Orthopedics;  Laterality: Right;  . ANTERIOR CERVICAL CORPECTOMY N/A 11/25/2015   Procedure: ANTERIOR CERVICAL FIVE CORPECTOMY Cervical four - six fusion;  Surgeon: Consuella Lose, MD;  Location: [facility];  Service: Neurosurgery;  Laterality: N/A;  ANTERIOR CERVICAL FIVE CORPECTOMY Cervical four - six fusion  . APPLICATION OF WOUND VAC Right 10/22/2018   Procedure: Application Of Wound Vac;  Surgeon: [name], MD;  Location: [facility];  Service: Orthopedics;  Laterality: Right;  . CYSTOSCOPY WITH RETROGRADE URETHROGRAM N/A 04/25/2018   Procedure: CYSTOSCOPY WITH RETROGRADE URETHROGRAM/ BALLOON DILATION;  Surgeon: Kathie Rhodes, MD;  Location: [facility];  Service: Urology;  Laterality: N/A;  . MULTIPLE TOOTH EXTRACTIONS    . POSTERIOR CERVICAL FUSION/FORAMINOTOMY N/A 11/29/2015   Procedure: Cervical Three-Cervical Seven Posterior Cervical Laminectomy  with Fusion;  Surgeon: Consuella Lose, MD;  Location: [facility];  Service: Neurosurgery;  Laterality: N/A;  Cervical Three-Cervical Seven Posterior Cervical Laminectomy with Fusion  . STUMP REVISION Right 10/22/2018   Procedure: REVISION RIGHT BELOW KNEE AMPUTATION;  Surgeon: [name], MD;  Location: [facility];  Service: Orthopedics;  Laterality: Right;   Social History   Occupational History  . Not on file  Tobacco Use  . Smoking status: Current Every Day Smoker    Packs/day: 0.50    Types: Cigarettes  . Smokeless tobacco: Never Used  Substance and Sexual Activity  . Alcohol use: No  . Drug use: No  . Sexual activity: Not on file

## 2018-12-02 ENCOUNTER — Ambulatory Visit (INDEPENDENT_AMBULATORY_CARE_PROVIDER_SITE_OTHER): Payer: Medicaid Other | Admitting: Orthopedic Surgery

## 2018-12-02 ENCOUNTER — Other Ambulatory Visit: Payer: Self-pay

## 2018-12-02 ENCOUNTER — Encounter: Payer: Self-pay | Admitting: Orthopedic Surgery

## 2018-12-02 VITALS — Ht 72.0 in | Wt 245.0 lb

## 2018-12-02 DIAGNOSIS — Z89611 Acquired absence of right leg above knee: Secondary | ICD-10-CM

## 2018-12-02 DIAGNOSIS — S78111A Complete traumatic amputation at level between right hip and knee, initial encounter: Secondary | ICD-10-CM

## 2018-12-02 NOTE — Progress Notes (Signed)
Office Visit Note   Patient: Gregory [name]           Date of Birth: 02/03/1961           MRN: [id number] Visit Date: 12/02/2018              Requested by: [name], MD [place],  [place] [address] PCP: [name], MD  Chief Complaint  Patient presents with  . Right Leg - Routine Post Op    11/19/2018 right leg AKA      HPI: Patient is a 58 year old gentleman who is 2 weeks status post revision of the right above-the-knee amputation.  Patient has refused therapy at skilled nursing patient has been manipulating the wound applying ointment to the wound and removing his dressing.  Patient has not received his stump shrinker after multiple prescriptions have been written.  Patient has refused to start physical therapy.  Assessment & Plan: Visit Diagnoses:  1. Unilateral AKA, right (Stevinson)     Plan: Patient was given instructions that he is not to apply ointment to the incision he is not to remove the dressing he is to start physical therapy tomorrow.  Patient cannot refuse physical therapy because of global complaints of pain.  Discussed that he must wear the stump shrinker prescriptions have been provided  After patient's manipulation and wound dehiscence we will need to plan for revision above-the-knee amputation on the right.  We will set this up for Friday with patient returning back to skilled nursing on Monday or Tuesday discussed that he would need a wound VAC and also discussed that patient will need to be compliant with the medical care.  Patient states that he has been in arguments with the skilled nursing administration discussed the importance of patient listening to the medical providers.  I reviewed what happened in the office with the patient's daughter and discussed the importance of proceeding with revision of the amputation in a sterile environment patient and his daughter want to see if this could be treated without surgical intervention.  Again  reinforced with the patient's daughter the importance of him being compliant with the medical treatment team that he needs to participate with physical therapy as instructed and not refused physical therapy and he needs to not manipulate the wound or remove the wound VAC dressing that is going to be applied postoperatively.  Follow-Up Instructions: No follow-ups on file.   Ortho Exam  Patient is alert, oriented, no adenopathy, well-dressed, normal affect, normal respiratory effort. Examination the incision is well-healed he does have swelling there is redness around the sutures and staples from skin irritation and swelling the incision has no drainage no cellulitis no signs of infection.  Plan to harvest the sutures and staples today.  The sutures and staples were harvested without complications.  Patient then manipulated the wound with pressure and was able to completely dehisced the wound with a large hematoma and clear serosanguineous drainage.  There is no purulence no abscess no signs of infection.  A new sterile dressing was applied.  Imaging: No results found.   Labs: Lab Results  Component Value Date   HGBA1C 7.7 (H) 11/17/2018   HGBA1C 8.0 (H) 06/04/2018   HGBA1C 7.9 (H) 04/25/2018   ESRSEDRATE 120 (H) 03/25/2017   ESRSEDRATE 84 (H) 10/02/2016   ESRSEDRATE 132 (H) 12/01/2015   CRP 4.9 (H) 03/25/2017   CRP 30.6 (H) 10/02/2016   CRP 13.7 (H) 11/11/2015   REPTSTATUS 11/17/2018  FINAL 11/16/2018   GRAMSTAIN  06/06/2018    RARE WBC PRESENT, PREDOMINANTLY PMN FEW GRAM POSITIVE COCCI    CULT  11/16/2018    NO GROWTH Performed at [facility] [address], [place], [place] [address]    LABORGA PROVIDENCIA [name] (A) 01/12/2017     Lab Results  Component Value Date   ALBUMIN 3.3 (L) 10/22/2018   ALBUMIN 2.2 (L) 06/04/2018   ALBUMIN 2.8 (L) 06/03/2018   PREALBUMIN 23.3 03/25/2017    Body mass index is 33.23 kg/m.  Orders:  No orders of the defined types  were placed in this encounter.  No orders of the defined types were placed in this encounter.    Procedures: No procedures performed  Clinical Data: No additional findings.  ROS:  All other systems negative, except as noted in the HPI. Review of Systems  Objective: Vital Signs: Ht 6' (1.829 m)   Wt 245 lb (111.1 kg)   BMI 33.23 kg/m   Specialty Comments:  No specialty comments available.  PMFS History: Patient Active Problem List   Diagnosis Date Noted  . Amputation stump infection (Geneva)   . Lymphedema of left leg   . Diabetic polyneuropathy associated with type 2 diabetes mellitus (Coburn)   . Wound infection 11/16/2018  . Below-knee amputation of right lower extremity (Ukiah) 10/22/2018  . Dehiscence of amputation stump (Angie)   . 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Miami) 08/12/2018  . Candidiasis 08/12/2018  . Necrosis of toe (Ty Ty) 07/08/2018  . Amputated toe of left foot (Tieton) 06/18/2018  . Streptococcal infection   . Pressure injury of skin 06/06/2018  . Cutaneous abscess of left foot   . Moderate protein-calorie malnutrition (Murphys Estates)   . Cellulitis of left leg   . Septic arthritis (Larrabee) 06/03/2018  . Idiopathic chronic venous hypertension of left lower extremity with ulcer and inflammation (Republic) 11/14/2017  . Great toe amputation status, left 11/14/2017  . Acquired absence of right leg below knee (Tierras Nuevas Poniente) 06/20/2017  . Osteomyelitis (Coldfoot) 03/25/2017  . Skin cancer 03/25/2017  . Depression, recurrent (Kempton) 11/05/2016  . Diabetic ulcer of toe of left foot (Hempstead) 10/02/2016  . 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 Chronic pain 09/27/2016  . Dyslipidemia associated with type 2 diabetes mellitus (Willow) 06/06/2016  . GERD without esophagitis 05/11/2016  . Hypertensive heart disease with congestive heart failure (Manton) 12/15/2015  . Spinal stenosis in cervical region 12/15/2015  . Type II diabetes mellitus with neurological manifestations (Switzer) 12/15/2015  . Chronic diastolic CHF  (congestive heart failure) (Henriette)   . Pulmonary hypertension (Mauriceville)   . Urinary retention   . Diskitis   . Foot drop, bilateral   . Sepsis with acute renal failure without septic shock (Fosston)   . MRSA bacteremia 10/03/2015  . COPD (chronic obstructive pulmonary disease) (Backus) 10/03/2015  . Acute osteomyelitis of left foot (Bozeman) 10/03/2015   Past Medical History:  Diagnosis Date  . Acquired absence of left great toe (Chester)   . Acquired absence of right leg below knee (Concordia)   . Acute osteomyelitis of calcaneum, right (Goshen) 10/02/2016  . Acute respiratory failure (Benton)   . Amputation stump infection (Roopville) 08/12/2018  . Anemia   . Basal cell carcinoma, eyelid   . Cancer (Holliday)   . Candidiasis 08/12/2018  . CHF (congestive heart failure) (HCC)    chronic diastolic   . Chronic back pain   . Chronic kidney disease    stage 3   . Chronic multifocal  osteomyelitis (Oakland)    of ankle and foot   . Chronic pain syndrome   . COPD (chronic obstructive pulmonary disease) (Lake Placid)   . DDD (degenerative disc disease), lumbar   . Depression    major depressive disorder   . Diabetes mellitus without complication (Stockholm)    type 2   . Diabetic ulcer of toe of left foot (Bolivar) 10/02/2016  . Difficulty in walking, not elsewhere classified   . 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 Foot drop, bilateral   . Foot drop, right 12/27/2015  . GERD (gastroesophageal reflux disease)   . Herpesviral vesicular dermatitis   . Hyperlipidemia   . Hyperlipidemia   . Hypertension   . Insomnia   . Malignant melanoma of other parts of face (Allenwood)   . MRSA bacteremia   . Muscle weakness (generalized)   . Nasal congestion   . Necrosis of toe (Hoytsville) 07/08/2018  . Neuromuscular dysfunction of bladder   . Osteomyelitis (Langhorne Manor)   . Osteomyelitis (Easton)   . Osteomyelitis (Vincent)    right BKA  . Other idiopathic peripheral autonomic neuropathy   . Retention of urine, unspecified   . Squamous cell carcinoma of skin   . Unsteadiness on feet    . Urinary retention   . Urinary retention   . Urinary tract infection   . Wears dentures   . Wears glasses     Family History  Problem Relation Age of Onset  . Cancer Other   . Diabetes Other     Past Surgical History:  Procedure Laterality Date  . AMPUTATION Left 03/29/2017   Procedure: LEFT GREAT TOE AMPUTATION AT METATARSOPHALANGEAL JOINT;  Surgeon: [name], MD;  Location: [facility];  Service: Orthopedics;  Laterality: Left;  . AMPUTATION Right 03/29/2017   Procedure: RIGHT BELOW KNEE AMPUTATION;  Surgeon: [name], MD;  Location: [facility];  Service: Orthopedics;  Laterality: Right;  . AMPUTATION Left 06/06/2018   Procedure: LEFT 2ND TOE AMPUTATION;  Surgeon: [name], MD;  Location: [facility];  Service: Orthopedics;  Laterality: Left;  . AMPUTATION Right 11/19/2018   Procedure: AMPUTATION ABOVE KNEE;  Surgeon: [name], MD;  Location: [facility];  Service: Orthopedics;  Laterality: Right;  . ANTERIOR CERVICAL CORPECTOMY N/A 11/25/2015   Procedure: ANTERIOR CERVICAL FIVE CORPECTOMY Cervical four - six fusion;  Surgeon: Consuella Lose, MD;  Location: [facility];  Service: Neurosurgery;  Laterality: N/A;  ANTERIOR CERVICAL FIVE CORPECTOMY Cervical four - six fusion  . APPLICATION OF WOUND VAC Right 10/22/2018   Procedure: Application Of Wound Vac;  Surgeon: [name], MD;  Location: [facility];  Service: Orthopedics;  Laterality: Right;  . CYSTOSCOPY WITH RETROGRADE URETHROGRAM N/A 04/25/2018   Procedure: CYSTOSCOPY WITH RETROGRADE URETHROGRAM/ BALLOON DILATION;  Surgeon: Kathie Rhodes, MD;  [facility];  Service: Urology;  Laterality: N/A;  . MULTIPLE TOOTH EXTRACTIONS    . POSTERIOR CERVICAL FUSION/FORAMINOTOMY N/A 11/29/2015   Procedure: Cervical Three-Cervical Seven Posterior Cervical Laminectomy with Fusion;  Surgeon: Consuella Lose, MD;  Location: [facility];  Service: Neurosurgery;  Laterality: N/A;  Cervical Three-Cervical Seven Posterior Cervical Laminectomy  with Fusion  . STUMP REVISION Right 10/22/2018   Procedure: REVISION RIGHT BELOW KNEE AMPUTATION;  Surgeon: [name], MD;  Location: [facility];  Service: Orthopedics;  Laterality: Right;   Social History   Occupational History  . Not on file  Tobacco Use  . Smoking status: Current Every Day Smoker    Packs/day:  0.50    Types: Cigarettes  . Smokeless tobacco: Never Used  Substance and Sexual Activity  . Alcohol use: No  . Drug use: No  . Sexual activity: Not on file

## 2018-12-03 NOTE — Progress Notes (Signed)
Spoke to Gregory [name] this morning. He is currently in a care facility and will be Covid 19 tested DOS.

## 2018-12-04 ENCOUNTER — Encounter (HOSPITAL_COMMUNITY): Payer: Self-pay | Admitting: *Deleted

## 2018-12-04 NOTE — Pre-Procedure Instructions (Addendum)
   Gregory [name]  12/04/2018    [name] procedure is scheduled on Friday, 12/05/18 at 10:30 AM   Report to [facility] Entrance "A" Admitting Office at 7:30 AM.   Call this number if you have problems the morning of surgery: [phone]   Remember:  Patient is not to eat food after midnight tonight.  Patient may drink clear liquids until 4:30 AM. .  Clear liquids allowed are:    Water, Gatorade, juice (No pulp or citric juice), black coffee, clear tea, plain Jello, plain popsicles                  Take these medicines the morning of surgery with A SIP OF WATER: Amlodipine (Norvasc), Cetirizine (Zyrtec), Finasteride (Proscar), Metoprolol (Lopressor), MS Contin (Morphine), Omeprazole (Prilosec), Pregabalin (Lyrica), Spiriva, Albuterol inhaler - if needed (please send with patient).  Patient is not to take his Glipizide morning of surgery. Please check patient's blood sugar when he gets up in the AM and every 2 hours until he leaves for the hospital. If blood sugar is >220 take 1/2 of usual correction dose of Humalog insulin. If blood sugar is 70 or below, treat with 1/2 cup of clear juice (apple or cranberry) and recheck blood sugar 15 minutes after drinking juice. If blood sugar continues to be 70 or below, call the Short Stay department and ask to speak to a nurse.  Per instructions Dr. Roberts Gaudy, Anesthesiologist/Marnell Mcdaniel [place], RN   Do not wear jewelry.  Do not wear lotions, powders, cologne or deodorant.  Men may shave face and neck.  Do not bring valuables to the hospital.  [facility] is not responsible for any belongings or valuables.  Contacts, dentures or bridgework may not be worn into surgery.  Leave your suitcase in the car.  After surgery it may be brought to your room.  For patients admitted to the hospital, discharge time will be determined by your treatment team.  Any questions today, please call [phone].

## 2018-12-04 NOTE — Progress Notes (Signed)
Pt is a resident at [facility]. Spoke with Zachery Conch, RN for pre-op call. Pt has hx of CHF. Sherlon Handing states no recent c/o chest pain or sob. Pt is a type 2 diabetic. Last A1C was 7.7 on 11/17/18. Sherlon Handing states pt's fasting blood sugar has been between 118-220. She also states that pt is self inflicting injuries to his stump. States he has used a fork at the suture site, refuses dressing changes. She states she has sent this information to [name]. Pt will arrive via transportation from [facility]. Pre-op instructions faxed to [place] at [phone].  Pt will be tested on arrival day of surgery.   Coronavirus Screening  Have you experienced the following symptoms:  Cough NO Fever (>100.57F)  NO Runny nose NO Sore throat NO Difficulty breathing/shortness of breath  NO  Have you or a family member traveled in the last 14 days and where? NO

## 2018-12-05 ENCOUNTER — Inpatient Hospital Stay (HOSPITAL_COMMUNITY): Payer: Medicaid Other | Admitting: Certified Registered"

## 2018-12-05 ENCOUNTER — Encounter (HOSPITAL_COMMUNITY)
Admission: RE | Disposition: A | Discharge status: Skilled Nursing Facility | Payer: Self-pay | Source: Home / Self Care | Attending: Orthopedic Surgery

## 2018-12-05 ENCOUNTER — Inpatient Hospital Stay (HOSPITAL_COMMUNITY)
Admission: RE | Admit: 2018-12-05 | Discharge: 2018-12-09 | DRG: 565 | Disposition: A | Discharge status: Skilled Nursing Facility | Payer: Medicaid Other | Attending: Orthopedic Surgery | Admitting: Orthopedic Surgery

## 2018-12-05 ENCOUNTER — Encounter (HOSPITAL_COMMUNITY): Payer: Self-pay

## 2018-12-05 ENCOUNTER — Other Ambulatory Visit: Payer: Self-pay

## 2018-12-05 DIAGNOSIS — F329 Major depressive disorder, single episode, unspecified: Secondary | ICD-10-CM | POA: Diagnosis present

## 2018-12-05 DIAGNOSIS — Z7951 Long term (current) use of inhaled steroids: Secondary | ICD-10-CM | POA: Diagnosis not present

## 2018-12-05 DIAGNOSIS — Z79899 Other long term (current) drug therapy: Secondary | ICD-10-CM

## 2018-12-05 DIAGNOSIS — Z981 Arthrodesis status: Secondary | ICD-10-CM | POA: Diagnosis not present

## 2018-12-05 DIAGNOSIS — Z89611 Acquired absence of right leg above knee: Secondary | ICD-10-CM

## 2018-12-05 DIAGNOSIS — Z794 Long term (current) use of insulin: Secondary | ICD-10-CM | POA: Diagnosis not present

## 2018-12-05 DIAGNOSIS — K219 Gastro-esophageal reflux disease without esophagitis: Secondary | ICD-10-CM | POA: Diagnosis present

## 2018-12-05 DIAGNOSIS — Z87891 Personal history of nicotine dependence: Secondary | ICD-10-CM | POA: Diagnosis not present

## 2018-12-05 DIAGNOSIS — Z89412 Acquired absence of left great toe: Secondary | ICD-10-CM

## 2018-12-05 DIAGNOSIS — Z79891 Long term (current) use of opiate analgesic: Secondary | ICD-10-CM

## 2018-12-05 DIAGNOSIS — E785 Hyperlipidemia, unspecified: Secondary | ICD-10-CM | POA: Diagnosis present

## 2018-12-05 DIAGNOSIS — Z1159 Encounter for screening for other viral diseases: Secondary | ICD-10-CM | POA: Diagnosis not present

## 2018-12-05 DIAGNOSIS — G894 Chronic pain syndrome: Secondary | ICD-10-CM | POA: Diagnosis present

## 2018-12-05 DIAGNOSIS — T8781 Dehiscence of amputation stump: Principal | ICD-10-CM | POA: Diagnosis present

## 2018-12-05 DIAGNOSIS — Z85820 Personal history of malignant melanoma of skin: Secondary | ICD-10-CM | POA: Diagnosis not present

## 2018-12-05 DIAGNOSIS — Z8614 Personal history of Methicillin resistant Staphylococcus aureus infection: Secondary | ICD-10-CM

## 2018-12-05 DIAGNOSIS — Z91040 Latex allergy status: Secondary | ICD-10-CM | POA: Diagnosis not present

## 2018-12-05 DIAGNOSIS — Z89422 Acquired absence of other left toe(s): Secondary | ICD-10-CM

## 2018-12-05 DIAGNOSIS — Z89511 Acquired absence of right leg below knee: Secondary | ICD-10-CM

## 2018-12-05 DIAGNOSIS — D631 Anemia in chronic kidney disease: Secondary | ICD-10-CM | POA: Diagnosis present

## 2018-12-05 DIAGNOSIS — J449 Chronic obstructive pulmonary disease, unspecified: Secondary | ICD-10-CM | POA: Diagnosis present

## 2018-12-05 DIAGNOSIS — I5032 Chronic diastolic (congestive) heart failure: Secondary | ICD-10-CM | POA: Diagnosis present

## 2018-12-05 DIAGNOSIS — Z833 Family history of diabetes mellitus: Secondary | ICD-10-CM | POA: Diagnosis not present

## 2018-12-05 DIAGNOSIS — I130 Hypertensive heart and chronic kidney disease with heart failure and stage 1 through stage 4 chronic kidney disease, or unspecified chronic kidney disease: Secondary | ICD-10-CM | POA: Diagnosis present

## 2018-12-05 HISTORY — PX: STUMP REVISION: SHX6102

## 2018-12-05 LAB — CBC
HCT: 26.5 % — ABNORMAL LOW (ref 39.0–52.0)
Hemoglobin: 7.7 g/dL — ABNORMAL LOW (ref 13.0–17.0)
MCH: 24.8 pg — ABNORMAL LOW (ref 26.0–34.0)
MCHC: 29.1 g/dL — ABNORMAL LOW (ref 30.0–36.0)
MCV: 85.2 fL (ref 80.0–100.0)
Platelets: 242 10*3/uL (ref 150–400)
RBC: 3.11 MIL/uL — ABNORMAL LOW (ref 4.22–5.81)
RDW: 17.5 % — ABNORMAL HIGH (ref 11.5–15.5)
WBC: 11 10*3/uL — ABNORMAL HIGH (ref 4.0–10.5)
nRBC: 0 % (ref 0.0–0.2)

## 2018-12-05 LAB — BASIC METABOLIC PANEL
Anion gap: 10 (ref 5–15)
BUN: 21 mg/dL — ABNORMAL HIGH (ref 6–20)
CO2: 24 mmol/L (ref 22–32)
Calcium: 8.6 mg/dL — ABNORMAL LOW (ref 8.9–10.3)
Chloride: 101 mmol/L (ref 98–111)
Creatinine, Ser: 2.04 mg/dL — ABNORMAL HIGH (ref 0.61–1.24)
GFR calc Af Amer: 40 mL/min — ABNORMAL LOW (ref >60)
GFR calc non Af Amer: 35 mL/min — ABNORMAL LOW (ref >60)
Glucose, Bld: 121 mg/dL — ABNORMAL HIGH (ref 70–99)
Potassium: 4.3 mmol/L (ref 3.5–5.1)
Sodium: 135 mmol/L (ref 135–145)

## 2018-12-05 LAB — GLUCOSE, CAPILLARY
Glucose-Capillary: 125 mg/dL — ABNORMAL HIGH (ref 70–99)
Glucose-Capillary: 100 mg/dL — ABNORMAL HIGH (ref 70–99)
Glucose-Capillary: 95 mg/dL (ref 70–99)
Glucose-Capillary: 98 mg/dL (ref 70–99)
Glucose-Capillary: 152 mg/dL — ABNORMAL HIGH (ref 70–99)
Glucose-Capillary: 193 mg/dL — ABNORMAL HIGH (ref 70–99)
Glucose-Capillary: 234 mg/dL — ABNORMAL HIGH (ref 70–99)

## 2018-12-05 LAB — SURGICAL PCR SCREEN
MRSA, PCR: POSITIVE — AB
Staphylococcus aureus: POSITIVE — AB

## 2018-12-05 LAB — PREPARE RBC (CROSSMATCH)

## 2018-12-05 LAB — SARS CORONAVIRUS 2: SARS Coronavirus 2: NOT DETECTED

## 2018-12-05 SURGERY — REVISION, AMPUTATION SITE
Anesthesia: General | Laterality: Right

## 2018-12-05 MED ORDER — FERROUS SULFATE 325 (65 FE) MG PO TABS
325.0000 mg | ORAL_TABLET | Freq: Three times a day (TID) | ORAL | Status: DC
  Administered 2018-12-05 – 2018-12-09 (×12): 325 mg via ORAL
  Filled 2018-12-05 (×13): qty 1

## 2018-12-05 MED ORDER — ACETAMINOPHEN 500 MG PO TABS
ORAL_TABLET | ORAL | Status: AC
  Administered 2018-12-05: 1000 mg
  Filled 2018-12-05: qty 2

## 2018-12-05 MED ORDER — FENTANYL CITRATE (PF) 100 MCG/2ML IJ SOLN
50.0000 ug | Freq: Once | INTRAMUSCULAR | Status: AC
  Administered 2018-12-05: 09:00:00 50 ug via INTRAVENOUS

## 2018-12-05 MED ORDER — DOCUSATE SODIUM 100 MG PO CAPS
100.0000 mg | ORAL_CAPSULE | Freq: Two times a day (BID) | ORAL | Status: DC
  Administered 2018-12-05 – 2018-12-09 (×9): 100 mg via ORAL
  Filled 2018-12-05 (×9): qty 1

## 2018-12-05 MED ORDER — TAMSULOSIN HCL 0.4 MG PO CAPS
0.8000 mg | ORAL_CAPSULE | Freq: Every evening | ORAL | Status: DC
  Administered 2018-12-05 – 2018-12-09 (×5): 0.8 mg via ORAL
  Filled 2018-12-05 (×5): qty 2

## 2018-12-05 MED ORDER — MIDAZOLAM HCL 2 MG/2ML IJ SOLN
INTRAMUSCULAR | Status: DC | PRN
  Administered 2018-12-05: 2 mg via INTRAVENOUS

## 2018-12-05 MED ORDER — PREGABALIN 25 MG PO CAPS
150.0000 mg | ORAL_CAPSULE | Freq: Two times a day (BID) | ORAL | Status: DC
  Administered 2018-12-05 – 2018-12-09 (×9): 150 mg via ORAL
  Filled 2018-12-05 (×9): qty 2

## 2018-12-05 MED ORDER — METOCLOPRAMIDE HCL 5 MG PO TABS: 5.0000 mg | ORAL_TABLET | Freq: Three times a day (TID) | ORAL | Status: DC | PRN

## 2018-12-05 MED ORDER — ALBUTEROL SULFATE (2.5 MG/3ML) 0.083% IN NEBU: 3.0000 mL | INHALATION_SOLUTION | Freq: Four times a day (QID) | RESPIRATORY_TRACT | Status: DC | PRN

## 2018-12-05 MED ORDER — ONDANSETRON HCL 4 MG PO TABS
4.0000 mg | ORAL_TABLET | Freq: Four times a day (QID) | ORAL | Status: DC | PRN
  Administered 2018-12-05: 4 mg via ORAL
  Filled 2018-12-05: qty 1

## 2018-12-05 MED ORDER — NICOTINE 14 MG/24HR TD PT24
14.0000 mg | MEDICATED_PATCH | Freq: Every day | TRANSDERMAL | Status: DC
  Administered 2018-12-05 – 2018-12-09 (×5): 14 mg via TRANSDERMAL
  Filled 2018-12-05 (×5): qty 1

## 2018-12-05 MED ORDER — ONDANSETRON HCL 4 MG/2ML IJ SOLN
4.0000 mg | Freq: Four times a day (QID) | INTRAMUSCULAR | Status: DC | PRN
  Administered 2018-12-06 – 2018-12-08 (×2): 4 mg via INTRAVENOUS
  Filled 2018-12-05 (×2): qty 2

## 2018-12-05 MED ORDER — LORATADINE 10 MG PO TABS
10.0000 mg | ORAL_TABLET | Freq: Every day | ORAL | Status: DC
  Administered 2018-12-06 – 2018-12-09 (×4): 10 mg via ORAL
  Filled 2018-12-05 (×4): qty 1

## 2018-12-05 MED ORDER — TIZANIDINE HCL 4 MG PO TABS: 4.0000 mg | ORAL_TABLET | Freq: Three times a day (TID) | ORAL | Status: DC | PRN

## 2018-12-05 MED ORDER — FENTANYL CITRATE (PF) 250 MCG/5ML IJ SOLN
INTRAMUSCULAR | Status: DC | PRN
  Administered 2018-12-05: 100 ug via INTRAVENOUS

## 2018-12-05 MED ORDER — PROPOFOL 10 MG/ML IV BOLUS
INTRAVENOUS | Status: DC | PRN
  Administered 2018-12-05: 10 mg via INTRAVENOUS
  Administered 2018-12-05: 150 mg via INTRAVENOUS
  Administered 2018-12-05: 20 mg via INTRAVENOUS

## 2018-12-05 MED ORDER — POLYETHYLENE GLYCOL 3350 17 G PO PACK
17.0000 g | PACK | Freq: Every day | ORAL | Status: DC | PRN
  Administered 2018-12-09: 12:00:00 17 g via ORAL
  Filled 2018-12-05: qty 1

## 2018-12-05 MED ORDER — METOPROLOL TARTRATE 50 MG PO TABS
50.0000 mg | ORAL_TABLET | Freq: Every day | ORAL | Status: DC
  Administered 2018-12-06 – 2018-12-09 (×4): 50 mg via ORAL
  Filled 2018-12-05 (×4): qty 1

## 2018-12-05 MED ORDER — FENTANYL CITRATE (PF) 100 MCG/2ML IJ SOLN
INTRAMUSCULAR | Status: AC
  Administered 2018-12-05: 09:00:00 50 ug via INTRAVENOUS
  Filled 2018-12-05: qty 2

## 2018-12-05 MED ORDER — SODIUM CHLORIDE 0.9 % IV SOLN
INTRAVENOUS | Status: DC
  Administered 2018-12-05: 08:00:00 via INTRAVENOUS

## 2018-12-05 MED ORDER — METOCLOPRAMIDE HCL 5 MG/ML IJ SOLN: 5.0000 mg | Freq: Three times a day (TID) | INTRAMUSCULAR | Status: DC | PRN

## 2018-12-05 MED ORDER — PAROXETINE HCL 20 MG PO TABS
20.0000 mg | ORAL_TABLET | Freq: Every day | ORAL | Status: DC
  Administered 2018-12-05 – 2018-12-09 (×5): 20 mg via ORAL
  Filled 2018-12-05 (×4): qty 1

## 2018-12-05 MED ORDER — MIDAZOLAM HCL 2 MG/2ML IJ SOLN
INTRAMUSCULAR | Status: AC
  Filled 2018-12-05: qty 2

## 2018-12-05 MED ORDER — METHOCARBAMOL 500 MG PO TABS
ORAL_TABLET | ORAL | Status: AC
  Filled 2018-12-05: qty 1

## 2018-12-05 MED ORDER — ONDANSETRON HCL 4 MG/2ML IJ SOLN
INTRAMUSCULAR | Status: DC | PRN
  Administered 2018-12-05: 4 mg via INTRAVENOUS

## 2018-12-05 MED ORDER — BISACODYL 10 MG RE SUPP: 10.0000 mg | Freq: Every day | RECTAL | Status: DC | PRN

## 2018-12-05 MED ORDER — METHOCARBAMOL 500 MG PO TABS
500.0000 mg | ORAL_TABLET | Freq: Four times a day (QID) | ORAL | Status: DC | PRN
  Administered 2018-12-05 – 2018-12-09 (×13): 500 mg via ORAL
  Filled 2018-12-05 (×12): qty 1

## 2018-12-05 MED ORDER — ZOLPIDEM TARTRATE 5 MG PO TABS
5.0000 mg | ORAL_TABLET | Freq: Every evening | ORAL | Status: DC | PRN
  Administered 2018-12-05 – 2018-12-07 (×3): 5 mg via ORAL
  Filled 2018-12-05 (×3): qty 1

## 2018-12-05 MED ORDER — ATORVASTATIN CALCIUM 10 MG PO TABS
20.0000 mg | ORAL_TABLET | Freq: Every day | ORAL | Status: DC
  Administered 2018-12-05 – 2018-12-09 (×5): 20 mg via ORAL
  Filled 2018-12-05 (×5): qty 2

## 2018-12-05 MED ORDER — AMLODIPINE BESYLATE 10 MG PO TABS
10.0000 mg | ORAL_TABLET | Freq: Every day | ORAL | Status: DC
  Administered 2018-12-06 – 2018-12-09 (×4): 10 mg via ORAL
  Filled 2018-12-05 (×4): qty 1

## 2018-12-05 MED ORDER — VITAMIN B-12 500 MCG PO TABS: 1000.0000 ug | ORAL_TABLET | Freq: Every day | ORAL | Status: DC

## 2018-12-05 MED ORDER — VITAMIN D-3 125 MCG (5000 UT) PO TABS: 5000.0000 [IU] | ORAL_TABLET | Freq: Every day | ORAL | Status: DC

## 2018-12-05 MED ORDER — POVIDONE-IODINE 10 % EX SWAB: 2.0000 "application " | Freq: Once | CUTANEOUS | Status: DC

## 2018-12-05 MED ORDER — FUROSEMIDE 40 MG PO TABS
40.0000 mg | ORAL_TABLET | Freq: Every day | ORAL | Status: DC
  Administered 2018-12-06 – 2018-12-09 (×4): 40 mg via ORAL
  Filled 2018-12-05 (×4): qty 1

## 2018-12-05 MED ORDER — CEFAZOLIN SODIUM-DEXTROSE 2-4 GM/100ML-% IV SOLN
2.0000 g | INTRAVENOUS | Status: AC
  Administered 2018-12-05: 11:00:00 2 g via INTRAVENOUS
  Filled 2018-12-05: qty 100

## 2018-12-05 MED ORDER — GLIPIZIDE 5 MG PO TABS
10.0000 mg | ORAL_TABLET | Freq: Every day | ORAL | Status: DC
  Administered 2018-12-06 – 2018-12-09 (×4): 10 mg via ORAL
  Filled 2018-12-05 (×4): qty 2

## 2018-12-05 MED ORDER — HYDROMORPHONE HCL 1 MG/ML IJ SOLN
INTRAMUSCULAR | Status: AC
  Filled 2018-12-05: qty 1

## 2018-12-05 MED ORDER — 0.9 % SODIUM CHLORIDE (POUR BTL) OPTIME
TOPICAL | Status: DC | PRN
  Administered 2018-12-05: 1000 mL

## 2018-12-05 MED ORDER — FENTANYL CITRATE (PF) 250 MCG/5ML IJ SOLN
INTRAMUSCULAR | Status: AC
  Filled 2018-12-05: qty 5

## 2018-12-05 MED ORDER — FERROUS SULFATE 325 (65 FE) MG PO TABS: 325.0000 mg | ORAL_TABLET | Freq: Every day | ORAL | Status: DC

## 2018-12-05 MED ORDER — PROPOFOL 10 MG/ML IV BOLUS
INTRAVENOUS | Status: AC
  Filled 2018-12-05: qty 20

## 2018-12-05 MED ORDER — CHLORHEXIDINE GLUCONATE 4 % EX LIQD: 60.0000 mL | Freq: Once | CUTANEOUS | Status: DC

## 2018-12-05 MED ORDER — ACETAMINOPHEN 325 MG PO TABS
325.0000 mg | ORAL_TABLET | Freq: Four times a day (QID) | ORAL | Status: DC | PRN
  Administered 2018-12-07: 650 mg via ORAL
  Administered 2018-12-07: 325 mg via ORAL
  Administered 2018-12-08 – 2018-12-09 (×4): 650 mg via ORAL
  Filled 2018-12-05 (×6): qty 2

## 2018-12-05 MED ORDER — OXYCODONE HCL 5 MG PO TABS
10.0000 mg | ORAL_TABLET | ORAL | Status: DC | PRN
  Administered 2018-12-05 – 2018-12-06 (×3): 15 mg via ORAL
  Filled 2018-12-05 (×3): qty 3

## 2018-12-05 MED ORDER — METHOCARBAMOL 1000 MG/10ML IJ SOLN
500.0000 mg | Freq: Four times a day (QID) | INTRAVENOUS | Status: DC | PRN
  Filled 2018-12-05: qty 5

## 2018-12-05 MED ORDER — ENSURE PRE-SURGERY PO LIQD
296.0000 mL | Freq: Once | ORAL | Status: DC
  Filled 2018-12-05: qty 296

## 2018-12-05 MED ORDER — SODIUM CHLORIDE 0.9% IV SOLUTION
Freq: Once | INTRAVENOUS | Status: AC
  Administered 2018-12-07: 10:00:00 via INTRAVENOUS

## 2018-12-05 MED ORDER — ONDANSETRON HCL 4 MG PO TABS: 4.0000 mg | ORAL_TABLET | Freq: Four times a day (QID) | ORAL | Status: DC | PRN

## 2018-12-05 MED ORDER — OXYCODONE HCL 5 MG PO TABS
ORAL_TABLET | ORAL | Status: AC
  Filled 2018-12-05: qty 2

## 2018-12-05 MED ORDER — SENNOSIDES-DOCUSATE SODIUM 8.6-50 MG PO TABS
2.0000 | ORAL_TABLET | Freq: Every day | ORAL | Status: DC
  Administered 2018-12-05 – 2018-12-09 (×5): 2 via ORAL
  Filled 2018-12-05 (×4): qty 2

## 2018-12-05 MED ORDER — HYDROMORPHONE HCL 1 MG/ML IJ SOLN
0.5000 mg | INTRAMUSCULAR | Status: DC | PRN
  Administered 2018-12-05 – 2018-12-06 (×4): 1 mg via INTRAVENOUS
  Filled 2018-12-05 (×3): qty 1

## 2018-12-05 MED ORDER — PHENYLEPHRINE 40 MCG/ML (10ML) SYRINGE FOR IV PUSH (FOR BLOOD PRESSURE SUPPORT)
PREFILLED_SYRINGE | INTRAVENOUS | Status: DC | PRN
  Administered 2018-12-05: 120 ug via INTRAVENOUS

## 2018-12-05 MED ORDER — PHENYLEPHRINE 40 MCG/ML (10ML) SYRINGE FOR IV PUSH (FOR BLOOD PRESSURE SUPPORT)
PREFILLED_SYRINGE | INTRAVENOUS | Status: AC
  Filled 2018-12-05: qty 10

## 2018-12-05 MED ORDER — LIDOCAINE HCL (CARDIAC) PF 100 MG/5ML IV SOSY
PREFILLED_SYRINGE | INTRAVENOUS | Status: DC | PRN
  Administered 2018-12-05: 100 mg via INTRAVENOUS

## 2018-12-05 MED ORDER — BUPROPION HCL ER (XL) 150 MG PO TB24
300.0000 mg | ORAL_TABLET | Freq: Every day | ORAL | Status: DC
  Administered 2018-12-05 – 2018-12-09 (×5): 300 mg via ORAL
  Filled 2018-12-05 (×5): qty 2

## 2018-12-05 MED ORDER — UMECLIDINIUM BROMIDE 62.5 MCG/INH IN AEPB
1.0000 | INHALATION_SPRAY | Freq: Every day | RESPIRATORY_TRACT | Status: DC
  Administered 2018-12-06 – 2018-12-09 (×3): 1 via RESPIRATORY_TRACT
  Filled 2018-12-05: qty 7

## 2018-12-05 MED ORDER — TRAMADOL HCL 50 MG PO TABS
50.0000 mg | ORAL_TABLET | Freq: Four times a day (QID) | ORAL | Status: DC
  Administered 2018-12-05 – 2018-12-09 (×13): 50 mg via ORAL
  Filled 2018-12-05 (×14): qty 1

## 2018-12-05 MED ORDER — OXYBUTYNIN CHLORIDE ER 15 MG PO TB24
15.0000 mg | ORAL_TABLET | Freq: Every day | ORAL | Status: DC
  Administered 2018-12-05 – 2018-12-09 (×5): 15 mg via ORAL
  Filled 2018-12-05 (×5): qty 1

## 2018-12-05 MED ORDER — TIOTROPIUM BROMIDE MONOHYDRATE 1.25 MCG/ACT IN AERS: 2.0000 | INHALATION_SPRAY | Freq: Every day | RESPIRATORY_TRACT | Status: DC

## 2018-12-05 MED ORDER — MORPHINE SULFATE ER 15 MG PO TBCR
15.0000 mg | EXTENDED_RELEASE_TABLET | Freq: Two times a day (BID) | ORAL | Status: DC
  Administered 2018-12-05 – 2018-12-09 (×9): 15 mg via ORAL
  Filled 2018-12-05 (×9): qty 1

## 2018-12-05 MED ORDER — MIRABEGRON ER 50 MG PO TB24
50.0000 mg | ORAL_TABLET | Freq: Every day | ORAL | Status: DC
  Administered 2018-12-06 – 2018-12-09 (×4): 50 mg via ORAL
  Filled 2018-12-05 (×4): qty 1

## 2018-12-05 MED ORDER — FINASTERIDE 5 MG PO TABS
5.0000 mg | ORAL_TABLET | Freq: Every day | ORAL | Status: DC
  Administered 2018-12-06 – 2018-12-09 (×4): 5 mg via ORAL
  Filled 2018-12-05 (×4): qty 1

## 2018-12-05 MED ORDER — TERBINAFINE HCL 250 MG PO TABS
250.0000 mg | ORAL_TABLET | Freq: Every day | ORAL | Status: DC
  Administered 2018-12-06 – 2018-12-09 (×4): 250 mg via ORAL
  Filled 2018-12-05 (×4): qty 1

## 2018-12-05 MED ORDER — TRAMADOL HCL 50 MG PO TABS
ORAL_TABLET | ORAL | Status: AC
  Filled 2018-12-05: qty 1

## 2018-12-05 MED ORDER — EPHEDRINE 5 MG/ML INJ
INTRAVENOUS | Status: AC
  Filled 2018-12-05: qty 10

## 2018-12-05 MED ORDER — PANTOPRAZOLE SODIUM 40 MG PO TBEC
40.0000 mg | DELAYED_RELEASE_TABLET | Freq: Every day | ORAL | Status: DC
  Administered 2018-12-06 – 2018-12-09 (×4): 40 mg via ORAL
  Filled 2018-12-05 (×4): qty 1

## 2018-12-05 MED ORDER — MAGNESIUM OXIDE 400 (241.3 MG) MG PO TABS
400.0000 mg | ORAL_TABLET | Freq: Two times a day (BID) | ORAL | Status: DC
  Administered 2018-12-05 – 2018-12-09 (×9): 400 mg via ORAL
  Filled 2018-12-05 (×9): qty 1

## 2018-12-05 MED ORDER — HYDROMORPHONE HCL 1 MG/ML IJ SOLN
0.2500 mg | INTRAMUSCULAR | Status: DC | PRN
  Administered 2018-12-05 (×4): 0.5 mg via INTRAVENOUS

## 2018-12-05 MED ORDER — CALCIUM CARBONATE ANTACID 500 MG PO CHEW: 2.0000 | CHEWABLE_TABLET | Freq: Three times a day (TID) | ORAL | Status: DC | PRN

## 2018-12-05 MED ORDER — ONDANSETRON HCL 4 MG/2ML IJ SOLN
INTRAMUSCULAR | Status: AC
  Filled 2018-12-05: qty 2

## 2018-12-05 MED ORDER — INSULIN ASPART 100 UNIT/ML ~~LOC~~ SOLN
0.0000 [IU] | Freq: Three times a day (TID) | SUBCUTANEOUS | Status: DC
  Administered 2018-12-06 (×2): 2 [IU] via SUBCUTANEOUS
  Administered 2018-12-06: 18:00:00 3 [IU] via SUBCUTANEOUS
  Administered 2018-12-07: 5 [IU] via SUBCUTANEOUS
  Administered 2018-12-07: 2 [IU] via SUBCUTANEOUS
  Administered 2018-12-07: 5 [IU] via SUBCUTANEOUS
  Administered 2018-12-08: 1 [IU] via SUBCUTANEOUS
  Administered 2018-12-08: 12:00:00 3 [IU] via SUBCUTANEOUS
  Administered 2018-12-08 – 2018-12-09 (×2): 1 [IU] via SUBCUTANEOUS
  Administered 2018-12-09 (×2): 2 [IU] via SUBCUTANEOUS

## 2018-12-05 MED ORDER — OXYCODONE HCL 5 MG PO TABS
5.0000 mg | ORAL_TABLET | ORAL | Status: DC | PRN
  Administered 2018-12-05 (×2): 10 mg via ORAL

## 2018-12-05 SURGICAL SUPPLY — 32 items
BLADE SAW RECIP 87.9 MT (BLADE) IMPLANT
BLADE SURG 21 STRL SS (BLADE) ×3 IMPLANT
CANISTER WOUND CARE 500ML ATS (WOUND CARE) ×3 IMPLANT
COVER SURGICAL LIGHT HANDLE (MISCELLANEOUS) ×3 IMPLANT
COVER WAND RF STERILE (DRAPES) ×3 IMPLANT
DRAPE EXTREMITY T 121X128X90 (DISPOSABLE) ×3 IMPLANT
DRAPE HALF SHEET 40X57 (DRAPES) ×3 IMPLANT
DRAPE INCISE IOBAN 66X45 STRL (DRAPES) ×3 IMPLANT
DRAPE U-SHAPE 47X51 STRL (DRAPES) ×6 IMPLANT
DRESSING PREVENA PLUS CUSTOM (GAUZE/BANDAGES/DRESSINGS) ×1 IMPLANT
DRSG PREVENA PLUS CUSTOM (GAUZE/BANDAGES/DRESSINGS) ×3
DURAPREP 26ML APPLICATOR (WOUND CARE) ×3 IMPLANT
ELECT REM PT RETURN 9FT ADLT (ELECTROSURGICAL) ×3
ELECTRODE REM PT RTRN 9FT ADLT (ELECTROSURGICAL) ×1 IMPLANT
GLOVE BIOGEL PI IND STRL 9 (GLOVE) ×1 IMPLANT
GLOVE BIOGEL PI INDICATOR 9 (GLOVE) ×2
GLOVE SURG ORTHO 9.0 STRL STRW (GLOVE) ×3 IMPLANT
GOWN STRL REUS W/ TWL XL LVL3 (GOWN DISPOSABLE) ×2 IMPLANT
GOWN STRL REUS W/TWL XL LVL3 (GOWN DISPOSABLE) ×6
KIT BASIN OR (CUSTOM PROCEDURE TRAY) ×3 IMPLANT
KIT TURNOVER KIT B (KITS) ×3 IMPLANT
MANIFOLD NEPTUNE II (INSTRUMENTS) ×3 IMPLANT
NS IRRIG 1000ML POUR BTL (IV SOLUTION) ×3 IMPLANT
PACK GENERAL/GYN (CUSTOM PROCEDURE TRAY) ×3 IMPLANT
PAD ARMBOARD 7.5X6 YLW CONV (MISCELLANEOUS) ×3 IMPLANT
PREVENA RESTOR ARTHOFORM 33X30 (CANNISTER) ×3 IMPLANT
PREVENA RESTOR ARTHOFORM 46X30 (CANNISTER) ×3 IMPLANT
STAPLER VISISTAT 35W (STAPLE) IMPLANT
SUT ETHILON 2 0 PSLX (SUTURE) ×6 IMPLANT
SUT SILK 2 0 (SUTURE)
SUT SILK 2-0 18XBRD TIE 12 (SUTURE) IMPLANT
TOWEL OR 17X26 10 PK STRL BLUE (TOWEL DISPOSABLE) ×3 IMPLANT

## 2018-12-05 NOTE — Anesthesia Preprocedure Evaluation (Addendum)
Anesthesia Evaluation  Patient identified by MRN, date of birth, ID band Patient awake    Reviewed: Allergy & Precautions, NPO status , Patient's Chart, lab work & pertinent test results  Airway Mallampati: III  TM Distance: >3 FB Neck ROM: Full    Dental no notable dental hx. (+) Edentulous Upper, Poor Dentition, Dental Advisory Given   Pulmonary COPD, Current Smoker,    Pulmonary exam normal breath sounds clear to auscultation       Cardiovascular hypertension, +CHF  Normal cardiovascular exam Rhythm:Regular Rate:Normal  Echo 11/20/15: Study Conclusions - Left ventricle: The cavity size was normal. There was mild concentric hypertrophy. Systolic function was normal. Theestimated ejection fraction was in the range of 55% to 60%. Doppler parameters are consistent with abnormal left ventricular relaxation (grade 1 diastolic dysfunction). - Pericardium, extracardiac: A small pericardial effusion was identified anterior to the heart. There was no evidence of hemodynamic compromise.  Carotid US 04/11/12: Summary: - No significant extracranial carotid artery stenosis demonstrated. Right vertebral could not be visualized due to thickness of the neck and respiratory interference. Left vertebral artery flow is antegrade - Technically difficult due to thickness of the neck, high bifurcations, and respiratory interference.   Neuro/Psych PSYCHIATRIC DISORDERS Depression negative neurological ROS     GI/Hepatic Neg liver ROS, GERD  ,  Endo/Other  negative endocrine ROSdiabetes, Type 2  Renal/GU Renal disease  negative genitourinary   Musculoskeletal  (+) Arthritis ,   Abdominal   Peds  Hematology  (+) Blood dyscrasia (Hgb 7.7), anemia ,   Anesthesia Other Findings   Reproductive/Obstetrics                           Anesthesia Physical Anesthesia Plan  ASA: III  Anesthesia Plan: General    Post-op Pain Management:    Induction: Intravenous  PONV Risk Score and Plan: 1 and Ondansetron, Midazolam and Dexamethasone  Airway Management Planned: LMA  Additional Equipment:   Intra-op Plan:   Post-operative Plan: Extubation in OR  Informed Consent: I have reviewed the patients History and Physical, chart, labs and discussed the procedure including the risks, benefits and alternatives for the proposed anesthesia with the patient or authorized representative who has indicated his/her understanding and acceptance.     Dental advisory given  Plan Discussed with: CRNA  Anesthesia Plan Comments:         Anesthesia Quick Evaluation

## 2018-12-05 NOTE — Plan of Care (Signed)
  Problem: Activity: Goal: Risk for activity intolerance will decrease Outcome: Progressing   Problem: Nutrition: Goal: Adequate nutrition will be maintained Outcome: Progressing   Problem: Pain Managment: Goal: General experience of comfort will improve Outcome: Progressing   Problem: Safety: Goal: Ability to remain free from injury will improve Outcome: Progressing   Problem: Skin Integrity: Goal: Risk for impaired skin integrity will decrease Outcome: Progressing   

## 2018-12-05 NOTE — Progress Notes (Signed)
Started 1unit PRBC transfusion, no adverse reactions noted, vital signs taken and recorded, will continue to monitor, endorsed accordingly.

## 2018-12-05 NOTE — Transfer of Care (Signed)
Immediate Anesthesia Transfer of Care Note  Patient: Gregory [name]  Procedure(s) Performed: Revision Right Above Knee Amputation (Right )  Patient Location: PACU  Anesthesia Type:General  Level of Consciousness: awake  Airway & Oxygen Therapy: Patient Spontanous Breathing and Patient connected to face mask oxygen  Post-op Assessment: Report given to RN and Post -op Vital signs reviewed and stable  Post vital signs: Reviewed and stable  Last Vitals:  Vitals Value Taken Time  BP 157/89 12/05/18 1149  Temp    Pulse 74 12/05/18 1150  Resp 20 12/05/18 1150  SpO2 100 % 12/05/18 1150  Vitals shown include unvalidated device data.  Last Pain:  Vitals:   12/05/18 0655  PainSc: 4       Patients Stated Pain Goal: 2 (79/48/01 6553)  Complications: No apparent anesthesia complications

## 2018-12-05 NOTE — H&P (Signed)
Gregory [name] is an 58 y.o. male.   Chief Complaint: Dehiscence left above-the-knee amputation site HPI: The patient is a 58 year old gentleman who is 2 weeks status post revision of a right above-the-knee amputation.  His sutures and staples were removed from the incision as it was healing well and following this the patient applied pressure over the area and the wound dehisced with drainage of hematoma /seroma with gross dehiscence of the incisional area.  The patient presents for revision above-the-knee amputation.  Past Medical History:  Diagnosis Date  . Acquired absence of left great toe ([place])   . Acquired absence of right leg below knee ([place])   . Acute osteomyelitis of calcaneum, right ([place]) 10/02/2016  . Acute respiratory failure ([place])   . Amputation stump infection ([place]) 08/12/2018  . Anemia   . Basal cell carcinoma, eyelid   . Cancer ([place])   . Candidiasis 08/12/2018  . CHF (congestive heart failure) (HCC)    chronic diastolic   . Chronic back pain   . Chronic kidney disease    stage 3   . Chronic multifocal osteomyelitis (H[facility])    of ankle and foot   . Chronic pain syndrome   . COPD (chronic obstructive pulmonary disease) ([place])   . DDD (degenerative disc disease), lumbar   . Depression    major depressive disorder   . Diabetes mellitus without complication ([place])    type 2   . Diabetic ulcer of toe of left foot ([place]) 10/02/2016  . Difficulty in walking, not elsewhere classified   . 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 Foot drop, bilateral   . Foot drop, right 12/27/2015  . GERD (gastroesophageal reflux disease)   . Herpesviral vesicular dermatitis   . Hyperlipidemia   . Hyperlipidemia   . Hypertension   . Insomnia   . Malignant melanoma of other parts of face ([place])   . MRSA bacteremia   . Muscle weakness (generalized)   . Nasal congestion   . Necrosis of toe ([place]) 07/08/2018  . Neuromuscular dysfunction of bladder   . Osteomyelitis ([place])   . Osteomyelitis ([place])   .  Osteomyelitis ([place])    right BKA  . Other idiopathic peripheral autonomic neuropathy   . Retention of urine, unspecified   . Squamous cell carcinoma of skin   . Unsteadiness on feet   . Urinary retention   . Urinary retention   . Urinary tract infection   . Wears dentures   . Wears glasses     Past Surgical History:  Procedure Laterality Date  . AMPUTATION Left 03/29/2017   Procedure: LEFT GREAT TOE AMPUTATION AT METATARSOPHALANGEAL JOINT;  Surgeon: [name], MD;  Location: [facility];  Service: Orthopedics;  Laterality: Left;  . AMPUTATION Right 03/29/2017   Procedure: RIGHT BELOW KNEE AMPUTATION;  Surgeon: [name], MD;  Location: [facility];  Service: Orthopedics;  Laterality: Right;  . AMPUTATION Left 06/06/2018   Procedure: LEFT 2ND TOE AMPUTATION;  Surgeon: [name], MD;  Location: [facility];  Service: Orthopedics;  Laterality: Left;  . AMPUTATION Right 11/19/2018   Procedure: AMPUTATION ABOVE KNEE;  Surgeon: [name], MD;  Location: [facility];  Service: Orthopedics;  Laterality: Right;  . ANTERIOR CERVICAL CORPECTOMY N/A 11/25/2015   Procedure: ANTERIOR CERVICAL FIVE CORPECTOMY Cervical four - six fusion;  Surgeon: Consuella Lose, MD;  Location: [facility];  Service: Neurosurgery;  Laterality: N/A;  ANTERIOR CERVICAL FIVE CORPECTOMY Cervical four - six fusion  . APPLICATION  OF WOUND VAC Right 10/22/2018   Procedure: Application Of Wound Vac;  Surgeon: [name], MD;  Location: [facility];  Service: Orthopedics;  Laterality: Right;  . CYSTOSCOPY WITH RETROGRADE URETHROGRAM N/A 04/25/2018   Procedure: CYSTOSCOPY WITH RETROGRADE URETHROGRAM/ BALLOON DILATION;  Surgeon: Kathie Rhodes, MD;  Location: [facility];  Service: Urology;  Laterality: N/A;  . MULTIPLE TOOTH EXTRACTIONS    . POSTERIOR CERVICAL FUSION/FORAMINOTOMY N/A 11/29/2015   Procedure: Cervical Three-Cervical Seven Posterior Cervical Laminectomy with Fusion;  Surgeon: Consuella Lose, MD;  Location: [facility];   Service: Neurosurgery;  Laterality: N/A;  Cervical Three-Cervical Seven Posterior Cervical Laminectomy with Fusion  . STUMP REVISION Right 10/22/2018   Procedure: REVISION RIGHT BELOW KNEE AMPUTATION;  Surgeon: [name], MD;  Location: [facility];  Service: Orthopedics;  Laterality: Right;    Family History  Problem Relation Age of Onset  . Cancer Other   . Diabetes Other    Social History:  reports that he has quit smoking. His smoking use included cigarettes. He smoked 0.50 packs per day. He has never used smokeless tobacco. He reports that he does not drink alcohol or use drugs.  Allergies:  Allergies  Allergen Reactions  . Latex Other (See Comments)    Listed on MAR    Medications Prior to Admission  Medication Sig Dispense Refill  . acetaminophen (TYLENOL) 325 MG tablet Take 650 mg by mouth every 4 (four) hours as needed (general discomfort).     Marland Kitchen albuterol (PROVENTIL HFA;VENTOLIN HFA) 108 (90 Base) MCG/ACT inhaler Inhale 2 puffs into the lungs every 6 (six) hours as needed for wheezing or shortness of breath.    Marland Kitchen amLODipine (NORVASC) 10 MG tablet Take 10 mg by mouth daily. for HTN    . atorvastatin (LIPITOR) 20 MG tablet Take 20 mg by mouth at bedtime.    . bisacodyl (DULCOLAX) 10 MG suppository Place 1 suppository (10 mg total) rectally daily as needed for moderate constipation. 12 suppository 0  . buPROPion (WELLBUTRIN XL) 300 MG 24 hr tablet Take 300 mg by mouth at bedtime.    . calcium carbonate (TUMS - DOSED IN MG ELEMENTAL CALCIUM) 500 MG chewable tablet Chew 2 tablets by mouth every 8 (eight) hours as needed for indigestion or heartburn.    . cetirizine (ZYRTEC) 10 MG tablet Take 10 mg by mouth daily.    . CETYLPYRIDINIUM CHLORIDE MT Use as directed 15 mLs in the mouth or throat every 4 (four) hours as needed (dry mouth).    . Cholecalciferol (VITAMIN D-3) 5000 units TABS Take 5,000 Units by mouth daily.    . eszopiclone (LUNESTA) 1 MG TABS tablet Take 1 mg by mouth at  bedtime. Take immediately before bedtime    . ferrous sulfate 325 (65 FE) MG tablet Take 1 tablet (325 mg total) by mouth daily with breakfast. 30 tablet 0  . finasteride (PROSCAR) 5 MG tablet Take 5 mg by mouth daily.    . furosemide (LASIX) 40 MG tablet Take 40 mg by mouth daily.    Marland Kitchen glipiZIDE (GLUCOTROL) 10 MG tablet Take 10 mg by mouth daily before breakfast.    . glucagon (GLUCAGON EMERGENCY) 1 MG injection Inject 1 mg into the muscle once as needed (for CBG <70).     Marland Kitchen guaiFENesin (MUCINEX) 600 MG 12 hr tablet Take 600 mg by mouth every 12 (twelve) hours as needed (congestion).     . insulin lispro (HUMALOG KWIKPEN) 100 UNIT/ML KwikPen Inject 0-10 Units  into the skin See admin instructions. Inject 0-10 units per sliding scale 3 times daily before meals; CBG <70 = notify MD, 0-150 = 0 units, 151-200 = 2 units, 201-250 = 4 units, 251-300 = 6 units, 301-350 = 8 units, 351-400 = 10 units, >400 = notify MD    . lidocaine (LIDODERM) 5 % Place 1 patch onto the skin daily. Apply to Right stump. Remove & Discard patch within 12 hours or as directed by MD    . magnesium oxide (MAG-OX) 400 MG tablet Take 400 mg by mouth 2 (two) times daily.    . Menthol, Topical Analgesic, (BIOFREEZE) 4 % GEL Apply 1 application topically every 4 (four) hours as needed (pain). Apply to right stump    . metoprolol tartrate (LOPRESSOR) 50 MG tablet Take 50 mg by mouth daily.     . mirabegron ER (MYRBETRIQ) 50 MG TB24 tablet Take 50 mg by mouth daily.    . mometasone (NASONEX) 50 MCG/ACT nasal spray Place 2 sprays into the nose at bedtime.     Marland Kitchen morphine (MS CONTIN) 15 MG 12 hr tablet Take 15 mg by mouth every 12 (twelve) hours.    . nicotine (NICODERM CQ - DOSED IN MG/24 HOURS) 14 mg/24hr patch Place 14 mg onto the skin daily.    Marland Kitchen omeprazole (PRILOSEC) 40 MG capsule Take 40 mg by mouth daily before breakfast.     . ondansetron (ZOFRAN) 4 MG tablet Take 4 mg by mouth every 6 (six) hours as needed for nausea or vomiting.     Marland Kitchen oxybutynin (DITROPAN XL) 15 MG 24 hr tablet Take 15 mg by mouth at bedtime.    . Oxycodone HCl 20 MG TABS Take 1 tablet (20 mg total) by mouth every 6 (six) hours as needed (pain). 28 tablet 0  . PARoxetine (PAXIL) 20 MG tablet Take 20 mg by mouth at bedtime.    . polyethylene glycol (MIRALAX / GLYCOLAX) packet Take 17 g by mouth daily as needed for mild constipation. 14 each 0  . pregabalin (LYRICA) 150 MG capsule Take 1 capsule (150 mg total) by mouth 2 (two) times daily. 14 capsule 0  . senna-docusate (SENOKOT-S) 8.6-50 MG tablet Take 2 tablets by mouth at bedtime.    . tamsulosin (FLOMAX) 0.4 MG CAPS capsule Take 0.8 mg by mouth every evening.     . terbinafine (LAMISIL) 250 MG tablet Take 250 mg by mouth daily.    . Tiotropium Bromide Monohydrate (SPIRIVA RESPIMAT) 1.25 MCG/ACT AERS Inhale 2 puffs into the lungs daily.    Marland Kitchen tiZANidine (ZANAFLEX) 4 MG tablet Take 4 mg by mouth every 8 (eight) hours as needed for muscle spasms.    . vitamin B-12 (CYANOCOBALAMIN) 500 MCG tablet Take 1,000 mcg by mouth daily.       Results for orders placed or performed during the hospital encounter of 12/05/18 (from the past 48 hour(s))  Glucose, capillary     Status: Abnormal   Collection Time: 12/05/18  6:40 AM  Result Value Ref Range   Glucose-Capillary 125 (H) 70 - 99 mg/dL   Comment 1 Notify RN    No results found.  Review of Systems  All other systems reviewed and are negative.   Blood pressure (!) 114/54, pulse 66, temperature 98.7 F (37.1 C), resp. rate 16, SpO2 95 %. Physical Exam  Constitutional: He is oriented to person, place, and time. He appears well-developed and well-nourished. No distress.  Neck: No tracheal deviation present. No thyromegaly  present.  Cardiovascular: Normal rate.  Respiratory: Effort normal. No stridor. No respiratory distress.  GI: Soft.  Musculoskeletal:     Comments: Following removal of the sutures and staples from the above-the-knee amputation site, the  patient manipulated the wound with pressure and was able to completely dehisced the wound with a large hematoma and clear serosanguineous drainage.  There is no purulence no abscess no signs of infection.  Neurological: He is alert and oriented to person, place, and time.  Skin: Skin is warm.     Assessment/Plan Dehiscence right above-the-knee amputation stump-plan revision right above-the-knee amputation-the procedure and benefits and risks including the risk of bleeding, infection, possible neurovascular injury and possible need for further surgery were discussed with the patient and the patient's questions were answered to his satisfaction.  The patient wishes to proceed with surgery at this time.  Erlinda Hong, [name] 12/05/2018, 7:30 AM [facility] [phone]

## 2018-12-05 NOTE — Anesthesia Procedure Notes (Signed)
Procedure Name: LMA Insertion Date/Time: 12/05/2018 10:55 AM Performed by: Julieta Bellini, CRNA Pre-anesthesia Checklist: Patient identified, Emergency Drugs available, Suction available and Patient being monitored Patient Re-evaluated:Patient Re-evaluated prior to induction Oxygen Delivery Method: Circle system utilized Preoxygenation: Pre-oxygenation with 100% oxygen Induction Type: IV induction LMA: LMA inserted LMA Size: 5.0 Placement Confirmation: positive ETCO2,  CO2 detector and breath sounds checked- equal and bilateral Tube secured with: Tape Dental Injury: Teeth and Oropharynx as per pre-operative assessment

## 2018-12-05 NOTE — Op Note (Signed)
12/05/2018  11:48 AM  PATIENT:  Gregory [name]    PRE-OPERATIVE DIAGNOSIS:  Dehiscence Right Above Knee Amputation  POST-OPERATIVE DIAGNOSIS:  Same  PROCEDURE:  Revision Right Above Knee Amputation  SURGEON:  [name], MD  PHYSICIAN ASSISTANT:None ANESTHESIA:   General  PREOPERATIVE INDICATIONS:  Gregory [name] is a  58 y.o. male with a diagnosis of Dehiscence Right Above Knee Amputation who failed conservative measures and elected for surgical management.    The risks benefits and alternatives were discussed with the patient preoperatively including but not limited to the risks of infection, bleeding, nerve injury, cardiopulmonary complications, the need for revision surgery, among others, and the patient was willing to proceed.  OPERATIVE IMPLANTS: [name] customizable and restore wound VAC  @ENCIMAGES @  OPERATIVE FINDINGS: Large area of wound dehiscence.  OPERATIVE PROCEDURE: Patient was brought the operating room and underwent a general anesthetic.  After adequate levels anesthesia were obtained patient's right lower extremity was prepped using DuraPrep draped into a sterile field a timeout was called.  A fishmouth incision was made around the dehisced wound.  This was carried down and tissue that was exposed to the dehisced wound was resected.  The vascular bundles were suture ligated with 2-0 silk.  The wound was irrigated with normal saline.  The incision was closed using 2-0 nylon closing the deep and superficial fascia layers and skin.  A customizable and restore wound VAC were applied these had a good suction fit patient was extubated taken the PACU in stable condition.   DISCHARGE PLANNING:  Antibiotic duration: Continue antibiotics for 24 hours  Weightbearing: Nonweightbearing on the right  Pain medication: Opioid pathway  Dressing care/ Wound VAC: Continue wound VAC for 1 week after discharge  Ambulatory devices: Walker  Discharge to: Discharge to skilled  nursing on Monday or Tuesday.  Follow-up: In the office 1 week post operative.

## 2018-12-06 LAB — TYPE AND SCREEN
ABO/RH(D): O POS
Antibody Screen: NEGATIVE
Unit division: 0
Unit division: 0

## 2018-12-06 LAB — BPAM RBC
Blood Product Expiration Date: 202007192359
Blood Product Expiration Date: 202007192359
ISSUE DATE / TIME: 202006191236
ISSUE DATE / TIME: 202006191812
Unit Type and Rh: 5100
Unit Type and Rh: 5100

## 2018-12-06 LAB — GLUCOSE, CAPILLARY
Glucose-Capillary: 152 mg/dL — ABNORMAL HIGH (ref 70–99)
Glucose-Capillary: 160 mg/dL — ABNORMAL HIGH (ref 70–99)
Glucose-Capillary: 225 mg/dL — ABNORMAL HIGH (ref 70–99)
Glucose-Capillary: 237 mg/dL — ABNORMAL HIGH (ref 70–99)

## 2018-12-06 LAB — HEMOGLOBIN AND HEMATOCRIT, BLOOD
HCT: 31.3 % — ABNORMAL LOW (ref 39.0–52.0)
Hemoglobin: 9.6 g/dL — ABNORMAL LOW (ref 13.0–17.0)

## 2018-12-06 MED ORDER — OXYCODONE HCL 5 MG PO TABS
20.0000 mg | ORAL_TABLET | Freq: Four times a day (QID) | ORAL | Status: DC | PRN
  Administered 2018-12-06 – 2018-12-09 (×13): 20 mg via ORAL
  Filled 2018-12-06 (×14): qty 4

## 2018-12-06 MED ORDER — LABETALOL HCL 5 MG/ML IV SOLN
10.0000 mg | Freq: Once | INTRAVENOUS | Status: AC
  Administered 2018-12-06: 10 mg via INTRAVENOUS
  Filled 2018-12-06: qty 4

## 2018-12-06 NOTE — Plan of Care (Signed)
  Problem: Activity: Goal: Risk for activity intolerance will decrease Outcome: Progressing   Problem: Nutrition: Goal: Adequate nutrition will be maintained Outcome: Progressing   Problem: Elimination: Goal: Will not experience complications related to bowel motility Outcome: Progressing   Problem: Pain Managment: Goal: General experience of comfort will improve Outcome: Progressing   

## 2018-12-06 NOTE — Plan of Care (Signed)

## 2018-12-06 NOTE — Evaluation (Signed)
Occupational Therapy Evaluation Patient Details Name: Gregory [name] MRN: [id number] DOB: [birth year] Today's Date: 12/06/2018    History of Present Illness  58 y.o. male admitted from [place] at [facility] on 11/16/18 with s/p R AKA 6/3. pt with revision of R AKA 12/05/18. PMH includes HF, DM, peripheral neuropathy, COPD, chronic pain, L eye blindness, h/o multiple amputations with revisions (most recently 10/22/18).   Clinical Impression   Patient is s/p revision R AKA surgery resulting in functional limitations due to the deficits listed below (see OT problem list). Pt currenlty min (A) to transfer L into w/c. Pt with leaking wound vac and RN made aware.  Patient will benefit from skilled OT acutely to increase independence and safety with ADLS to allow discharge SNF .     Follow Up Recommendations  SNF    Equipment Recommendations  Wheelchair (measurements OT);Wheelchair cushion (measurements OT);Hospital bed    Recommendations for Other Services       Precautions / Restrictions Precautions Precaution Comments: R AKA with wound vac Restrictions Weight Bearing Restrictions: Yes RLE Weight Bearing: Non weight bearing      Mobility Bed Mobility Overal bed mobility: Needs Assistance Bed Mobility: Supine to Sit     Supine to sit: Min assist     General bed mobility comments: use of bed rail to exit on L side to static sit. pt with (A) to steady initially  Transfers Overall transfer level: Needs assistance   Transfers: Stand Pivot Transfers   Stand pivot transfers: Min assist       General transfer comment: pt transfering to chair on the L side min (A)     Balance                                           ADL either performed or assessed with clinical judgement   ADL Overall ADL's : Needs assistance/impaired Eating/Feeding: Independent   Grooming: Wash/dry hands   Upper Body Bathing: Minimal assistance   Lower Body Bathing: Moderate  assistance   Upper Body Dressing : Minimal assistance   Lower Body Dressing: Moderate assistance         Toileting - Clothing Manipulation Details (indicate cue type and reason): pt voiding in urinal supine during session and spilling over the urinal volume possible. Bed adn patient gown wet. Urine was straw yellow in color       General ADL Comments: pt agreeable to OOB to w/c to eat lunch. pt sleeping soundly on arrival and startles awake to therapist calling name for the 4th time.      Vision         Perception     Praxis      Pertinent Vitals/Pain Pain Assessment: Faces Faces Pain Scale: Hurts whole lot Pain Location: R AKA site Pain Descriptors / Indicators: Operative site guarding;Grimacing;Discomfort Pain Intervention(s): Patient requesting pain meds-RN notified;Premedicated before session;Repositioned     Hand Dominance Right   Extremity/Trunk Assessment Upper Extremity Assessment Upper Extremity Assessment: Overall WFL for tasks assessed   Lower Extremity Assessment Lower Extremity Assessment: Defer to PT evaluation   Cervical / Trunk Assessment Cervical / Trunk Assessment: Normal   Communication Communication Communication: HOH   Cognition Arousal/Alertness: Awake/alert Behavior During Therapy: WFL for tasks assessed/performed Overall Cognitive Status: Within Functional Limits for tasks assessed  General Comments  wound vac leaking and RN made aware    Exercises     Shoulder Instructions      Home Living Family/patient expects to be discharged to:: Skilled nursing facility                                 Additional Comments: permanent reside of facility      Prior Functioning/Environment Level of Independence: Needs assistance  Gait / Transfers Assistance Needed: Pt reports indep to stand on LLE and transfer to his tilt w/c. Does not perform tilting ( ADL's / Homemaking  Assistance Needed: Reports can dress/bathe self- goes into the shower            OT Problem List: Decreased strength;Decreased activity tolerance;Impaired balance (sitting and/or standing);Decreased safety awareness;Decreased knowledge of use of DME or AE;Decreased knowledge of precautions;Pain;Obesity;Decreased range of motion      OT Treatment/Interventions: Self-care/ADL training;Therapeutic exercise;Energy conservation;Neuromuscular education;DME and/or AE instruction;Manual therapy;Modalities;Therapeutic activities;Patient/family education;Balance training    OT Goals(Current goals can be found in the care plan section) Acute Rehab OT Goals Patient Stated Goal: to return to [place] and not smokea gain OT Goal Formulation: With patient Time For Goal Achievement: 12/20/18 Potential to Achieve Goals: Good  OT Frequency: Min 3X/week   Barriers to D/C:            Co-evaluation              AM-PAC OT "6 Clicks" Daily Activity     Outcome Measure Help from another person eating meals?: None Help from another person taking care of personal grooming?: None Help from another person toileting, which includes using toliet, bedpan, or urinal?: A Lot Help from another person bathing (including washing, rinsing, drying)?: A Lot Help from another person to put on and taking off regular upper body clothing?: None Help from another person to put on and taking off regular lower body clothing?: Total 6 Click Score: 17   End of Session Nurse Communication: Mobility status;Precautions;Weight bearing status  Activity Tolerance: Patient tolerated treatment well Patient left: in chair;with call bell/phone within reach;with nursing/sitter in room  OT Visit Diagnosis: Unsteadiness on feet (R26.81);Muscle weakness (generalized) (M62.81)                Time: 8299-3716 OT Time Calculation (min): 22 min Charges:  OT General Charges $OT Visit: 1 Visit OT Evaluation $OT Eval Moderate  Complexity: 1 Mod   Jeri Modena, OTR/L  Acute Rehabilitation Services Pager: [phone] Office: [phone] .   Jeri Modena 12/06/2018, 3:47 PM

## 2018-12-06 NOTE — Progress Notes (Signed)
Patient ID: Gregory [name], male   DOB: 29-May-1961, 58 y.o.   MRN: [id number] Postoperative day 1 revision right above-the-knee amputation.  There is approximately 50 cc of the wound VAC canister.  This has a good suction fit patient continues to complain of pain will adjust the pain medication schedule.  Plan for discharge back to skilled nursing on Monday or Tuesday.

## 2018-12-06 NOTE — Progress Notes (Signed)
Pt stated no one to call to update and no number seen in chart.

## 2018-12-06 NOTE — Progress Notes (Signed)
Patient's HR has been running on 130's. Pt denies any chest pain but stated that 'feels it's running fast'. MD oncall notified and PA called back and ordered to give labetalol 10 mg IV once. Patient's HR down to 80's. BP 135/87. Nursing will continue to monitor.

## 2018-12-06 NOTE — Evaluation (Signed)
Physical Therapy Evaluation Patient Details Name: Gregory [name] MRN: [id number] DOB: January 01, 1961 Today's Date: 12/06/2018   History of Present Illness   58 y.o. 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 pt with revision of R AKA 12/05/18. PMH includes HF, DM, peripheral neuropathy, COPD, chronic pain, L eye blindness, h/o multiple amputations with revisions (most recently 10/22/18).    Clinical Impression  Pt admitted with above diagnosis. Pt currently with functional limitations due to the deficits listed below (see PT Problem List). PTA pt resided in SNF. He was mod I mobility utilizing wheelchair. On eval, pt required min assist bed mobility and min assist squat pivot transfer bed to w/c. Pt will benefit from skilled PT to increase their independence and safety with mobility to allow discharge to the venue listed below.       Follow Up Recommendations SNF    Equipment Recommendations  None recommended by PT    Recommendations for Other Services       Precautions / Restrictions Precautions Precautions: Fall;Other (comment) Precaution Comments: R AKA with wound vac Restrictions Weight Bearing Restrictions: Yes RLE Weight Bearing: Non weight bearing      Mobility  Bed Mobility Overal bed mobility: Needs Assistance Bed Mobility: Supine to Sit     Supine to sit: Min assist     General bed mobility comments: +rail, increased time and effort  Transfers Overall transfer level: Needs assistance Equipment used: None Transfers: Squat Pivot Transfers   Stand pivot transfers: Min assist Squat pivot transfers: Min assist     General transfer comment: pivot transfer bed to w/c toward L side  Ambulation/Gait             General Gait Details: nonambulatory at baseline  Stairs            Wheelchair Mobility    Modified Rankin (Stroke Patients Only)       Balance Overall balance assessment: Needs assistance Sitting-balance  support: No upper extremity supported;Feet supported Sitting balance-Leahy Scale: Good     Standing balance support: Bilateral upper extremity supported Standing balance-Leahy Scale: Poor Standing balance comment: reliant on UE support during transfer                             Pertinent Vitals/Pain Pain Assessment: Faces Faces Pain Scale: Hurts whole lot Pain Location: R AKA site Pain Descriptors / Indicators: Operative site guarding;Grimacing;Discomfort Pain Intervention(s): Patient requesting pain meds-RN notified;Limited activity within patient's tolerance;Repositioned    Home Living Family/patient expects to be discharged to:: Skilled nursing facility                 Additional Comments: permanent reside of facility    Prior Function Level of Independence: Needs assistance   Gait / Transfers Assistance Needed: Pt reports indep to stand on LLE and transfer to his tilt w/c. Does not perform tilting (  ADL's / Homemaking Assistance Needed: Reports can dress/bathe self- goes into the shower        Hand Dominance   Dominant Hand: Right    Extremity/Trunk Assessment   Upper Extremity Assessment Upper Extremity Assessment: Overall WFL for tasks assessed    Lower Extremity Assessment Lower Extremity Assessment: RLE deficits/detail RLE Deficits / Details: s/p R AKA revision    Cervical / Trunk Assessment Cervical / Trunk Assessment: Normal  Communication   Communication: HOH  Cognition Arousal/Alertness: Awake/alert Behavior During Therapy: WFL for  tasks assessed/performed Overall Cognitive Status: Within Functional Limits for tasks assessed                                        General Comments General comments (skin integrity, edema, etc.): wound vac leaking and RN made aware    Exercises     Assessment/Plan    PT Assessment Patient needs continued PT services  PT Problem List Decreased mobility;Decreased activity  tolerance;Pain;Decreased balance       PT Treatment Interventions Therapeutic activities;Therapeutic exercise;Patient/family education;Balance training;Functional mobility training    PT Goals (Current goals can be found in the Care Plan section)  Acute Rehab PT Goals Patient Stated Goal: return to SNF and not start smoking again PT Goal Formulation: With patient Time For Goal Achievement: 12/20/18 Potential to Achieve Goals: Good    Frequency Min 2X/week   Barriers to discharge        Co-evaluation PT/OT/SLP Co-Evaluation/Treatment: Yes Reason for Co-Treatment: To address functional/ADL transfers PT goals addressed during session: Mobility/safety with mobility;Balance         AM-PAC PT "6 Clicks" Mobility  Outcome Measure Help needed turning from your back to your side while in a flat bed without using bedrails?: None Help needed moving from lying on your back to sitting on the side of a flat bed without using bedrails?: A Little Help needed moving to and from a bed to a chair (including a wheelchair)?: A Little Help needed standing up from a chair using your arms (e.g., wheelchair or bedside chair)?: A Little Help needed to walk in hospital room?: Total Help needed climbing 3-5 steps with a railing? : Total 6 Click Score: 15    End of Session   Activity Tolerance: Patient tolerated treatment well Patient left: in chair;with call bell/phone within reach Nurse Communication: Mobility status;Patient requests pain meds PT Visit Diagnosis: Other abnormalities of gait and mobility (R26.89)    Time: 0865-7846 PT Time Calculation (min) (ACUTE ONLY): 24 min   Charges:   PT Evaluation $PT Eval Moderate Complexity: 1 Mod          Lorrin Goodell, PT  Office # [phone] Pager [phone]   Lorriane Shire 12/06/2018, 4:18 PM

## 2018-12-07 LAB — GLUCOSE, CAPILLARY
Glucose-Capillary: 278 mg/dL — ABNORMAL HIGH (ref 70–99)
Glucose-Capillary: 251 mg/dL — ABNORMAL HIGH (ref 70–99)
Glucose-Capillary: 166 mg/dL — ABNORMAL HIGH (ref 70–99)
Glucose-Capillary: 157 mg/dL — ABNORMAL HIGH (ref 70–99)

## 2018-12-07 NOTE — Progress Notes (Signed)
Pt is noncompliant with diet and has been educated due to his DM. Will continue to monitor pt.   Pt also stated no one to update about his POC.

## 2018-12-07 NOTE — Plan of Care (Signed)
  Problem: Nutrition: Goal: Adequate nutrition will be maintained Outcome: Progressing   Problem: Pain Managment: Goal: General experience of comfort will improve Outcome: Not Progressing  Patient continues to complain pain despite scheduled and as needed pain medications are given.  Problem: Safety: Goal: Ability to remain free from injury will improve Outcome: Progressing   Problem: Skin Integrity: Goal: Risk for impaired skin integrity will decrease Outcome: Progressing

## 2018-12-07 NOTE — TOC Initial Note (Addendum)
Transition of Care [facility]) - Initial/Assessment Note    Patient Details  Name: Gregory [name] MRN: [id number] Date of Birth: Nov 03, 1960  Transition of [facility]) CM/SW Contact:    Bary Castilla, LCSW Phone Number: 12/07/2018, 2:09 PM  Clinical Narrative:    CSW met with patient bedside to discuss patient's discharge plan.Patient informed CSW that he plans to return back to [place]. CSW completed TOC assessment for client and called [facility] and left voice mail.       2:31pm Tammy from [facility] called CSW back and stated that patient could return. She stated that he would need a COVID test that is within 48 hours. A new test would need to be performed. CSW informed nurse of new test needed.           Expected Discharge Plan: Skilled Nursing Facility Barriers to Discharge: Continued Medical Work up   Patient Goals and CMS Choice Patient states their goals for this hospitalization and ongoing recovery are:: To go back to [facility]      Expected Discharge Plan and Services Expected Discharge Plan: [place] arrangements for the past 2 months: [place] Expected Discharge Date: 12/09/18                                    Prior Living Arrangements/Services Living arrangements for the past 2 months: [place] Lives with:: Facility Resident Patient language and need for interpreter reviewed:: Yes Do you feel safe going back to the place where you live?: Yes        Care giver support system in place?: Yes (comment)      Activities of Daily Living Home Assistive Devices/Equipment: Wheelchair, CBG Meter ADL Screening (condition at time of admission) Patient's cognitive ability adequate to safely complete daily activities?: Yes Is the patient deaf or have difficulty hearing?: No Does the patient have difficulty seeing, even when wearing glasses/contacts?: No Does the patient have difficulty concentrating,  remembering, or making decisions?: No Patient able to express need for assistance with ADLs?: Yes Does the patient have difficulty dressing or bathing?: No Independently performs ADLs?: Yes (appropriate for developmental age) Does the patient have difficulty walking or climbing stairs?: Yes Weakness of Legs: Right Weakness of Arms/Hands: None  Permission Sought/Granted Permission sought to share information with : Facility Sport and exercise psychologist, Family Supports Permission granted to share information with : Yes, Verbal Permission Granted  Share Information with NAME: [name]  Permission granted to share info w AGENCY: [place] granted to share info w Relationship: daughter  Permission granted to share info w Contact Information: [phone]  Emotional Assessment Appearance:: Appears stated age Attitude/Demeanor/Rapport: Complaining Affect (typically observed): Agitated Orientation: : Oriented to Self, Oriented to Place, Oriented to  Time, Oriented to Situation   Psych Involvement: No (comment)  Admission diagnosis:  Dehiscence Right Above Knee Amputation Patient Active Problem List   Diagnosis Date Noted  . Acquired absence of right lower extremity above knee ([place]) 12/05/2018  . Amputation stump infection ([place])   . Lymphedema of left leg   . Diabetic polyneuropathy associated with type 2 diabetes mellitus ([place])   . Wound infection 11/16/2018  . Below-knee amputation of right lower extremity ([place]) 10/22/2018  . Dehiscence of amputation stump ([place])   . 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[place]) 08/12/2018  . Candidiasis 08/12/2018  . Necrosis  of toe ([place]) 07/08/2018  . Amputated toe of left foot ([place]) 06/18/2018  . Streptococcal infection   . Pressure injury of skin 06/06/2018  . Cutaneous abscess of left foot   . Moderate protein-calorie malnutrition ([place])   . Cellulitis of left leg   . Septic arthritis ([place]) 06/03/2018  . 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 Great toe amputation status, left 11/14/2017  . Acquired absence of right leg below knee ([place]) 06/20/2017  . Osteomyelitis ([place]) 03/25/2017  . Skin cancer 03/25/2017  . Depression, recurrent ([place]) 11/05/2016  . Diabetic ulcer of toe of left foot ([place]) 10/02/2016  . 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 Chronic pain 09/27/2016  . Dyslipidemia associated with type 2 diabetes mellitus ([employer]) 06/06/2016  . GERD without esophagitis 05/11/2016  . Hypertensive heart disease with congestive heart failure ([place]) 12/15/2015  . Spinal stenosis in cervical region 12/15/2015  . Type II diabetes mellitus with neurological manifestations ([place]) 12/15/2015  . Chronic diastolic CHF (congestive heart failure) ([place])   . Pulmonary hypertension ([place])   . Urinary retention   . Diskitis   . Foot drop, bilateral   . Sepsis with acute renal failure without septic shock ([place])   . MRSA bacteremia 10/03/2015  . COPD (chronic obstructive pulmonary disease) ([place]) 10/03/2015  . Acute osteomyelitis of left foot ([place]) 10/03/2015   PCP:  [name], MD Pharmacy:  No Pharmacies Listed    Social Determinants of Health (SDOH) Interventions    Readmission Risk Interventions Readmission Risk Prevention Plan 12/07/2018 11/20/2018  Transportation Screening Complete Complete  Medication Review Press photographer) Complete Complete  PCP or Specialist appointment within 3-5 days of discharge Complete Complete  HRI or [place] Not Complete Not Complete  HRI or Home Care Consult Pt Refusal Comments pt from SNF LTC pt is LTC SNF resident  SW Recovery Care/Counseling Consult Complete Complete  Palliative Care Screening Not Applicable Not Applicable  Skilled Nursing Facility Complete Complete  Some recent data might be hidden

## 2018-12-07 NOTE — Progress Notes (Signed)
Subjective: 2 Days Post-Op Procedure(s) (LRB): Revision Right Above Knee Amputation (Right) Patient reports pain as moderate.  Continues to be in pain.  Patient on chronic pain meds.  Otherwise, no complaints  Objective: Vital signs in last 24 hours: Temp:  [98.4 F (36.9 C)-98.7 F (37.1 C)] 98.4 F (36.9 C) (06/21 0736) Pulse Rate:  [61-86] 80 (06/21 0804) Resp:  [14-20] 18 (06/21 0736) BP: (100-149)/(51-75) 149/73 (06/21 0804) SpO2:  [93 %-100 %] 94 % (06/21 0736)  Intake/Output from previous day: 06/20 0701 - 06/21 0700 In: 240 [P.O.:240] Out: 2500 [Urine:2450; Drains:50] Intake/Output this shift: No intake/output data recorded.  Recent Labs    12/05/18 0715 12/06/18 0024  HGB 7.7* 9.6*   Recent Labs    12/05/18 0715 12/06/18 0024  WBC 11.0*  --   RBC 3.11*  --   HCT 26.5* 31.3*  PLT 242  --    Recent Labs    12/05/18 0715  NA 135  K 4.3  CL 101  CO2 24  BUN 21*  CREATININE 2.04*  GLUCOSE 121*  CALCIUM 8.6*   No results for input(s): LABPT, INR in the last 72 hours.  Neurologically intact  Wound vac functioning properly- 100 cc blood in canister   Assessment/Plan: 2 Days Post-Op Procedure(s) (LRB): Revision Right Above Knee Amputation (Right) Up with therapy  NWB RLE I do not feel comfortable increasing pain meds and patient is aware Plan for d/c back to SNF Monday or Tuesday     Aundra Dubin 12/07/2018, 8:24 AM

## 2018-12-07 NOTE — Anesthesia Postprocedure Evaluation (Signed)
Anesthesia Post Note  Patient: Gregory [name]  Procedure(s) Performed: Revision Right Above Knee Amputation (Right )     Patient location during evaluation: PACU Anesthesia Type: General Level of consciousness: awake and alert Pain management: pain level controlled Vital Signs Assessment: post-procedure vital signs reviewed and stable Respiratory status: spontaneous breathing, nonlabored ventilation, respiratory function stable and patient connected to nasal cannula oxygen Cardiovascular status: blood pressure returned to baseline and stable Postop Assessment: no apparent nausea or vomiting Anesthetic complications: no    Last Vitals:  Vitals:   12/06/18 1949 12/07/18 0512  BP: 136/68 (!) 141/74  Pulse: 61 79  Resp:  14  Temp: 37.1 C 37.1 C  SpO2: 93% 93%    Last Pain:  Vitals:   12/07/18 0512  TempSrc: Oral  PainSc: 8                  Jewel Venditto L Randye Treichler

## 2018-12-07 NOTE — NC FL2 (Signed)
[place] LEVEL OF CARE SCREENING TOOL     IDENTIFICATION  Patient Name: Gregory [name] Birthdate: 16-Mar-1961 Sex: male Admission Date (Current Location): 12/05/2018  [facility] and [place] Number:  Herbalist and Address:         Provider Number: [id number]  Attending Physician Name and Address:  Gregory Minion, MD  Relative Name and Phone Number:  Gregory [name]; daughter    Current Level of Care: Hospital Recommended Level of Care: [place] Prior Approval Number:    Date Approved/Denied:   PASRR Number: [id number]  Discharge Plan: SNF    Current Diagnoses: Patient Active Problem List   Diagnosis Date Noted  . Acquired absence of right lower extremity above knee ([place]) 12/05/2018  . Amputation stump infection ([place])   . Lymphedema of left leg   . Diabetic polyneuropathy associated with type 2 diabetes mellitus ([place])   . Wound infection 11/16/2018  . Below-knee amputation of right lower extremity ([place]) 10/22/2018  . Dehiscence of amputation stump ([place])   . 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[place]) 08/12/2018  . Candidiasis 08/12/2018  . Necrosis of toe ([place]) 07/08/2018  . Amputated toe of left foot ([place]) 06/18/2018  . Streptococcal infection   . Pressure injury of skin 06/06/2018  . Cutaneous abscess of left foot   . Moderate protein-calorie malnutrition ([place])   . Cellulitis of left leg   . Septic arthritis ([place]) 06/03/2018  . Idiopathic chronic venous hypertension of left lower extremity with ulcer and inflammation ([place]) 11/14/2017  . Great toe amputation status, left 11/14/2017  . Acquired absence of right leg below knee ([place]) 06/20/2017  . Osteomyelitis ([place]) 03/25/2017  . Skin cancer 03/25/2017  . Depression, recurrent ([place]) 11/05/2016  . Diabetic ulcer of toe of left foot ([place]) 10/02/2016  . 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 Chronic pain 09/27/2016  . Dyslipidemia associated with type 2 diabetes  mellitus ([place]) 06/06/2016  . GERD without esophagitis 05/11/2016  . Hypertensive heart disease with congestive heart failure ([place]) 12/15/2015  . Spinal stenosis in cervical region 12/15/2015  . Type II diabetes mellitus with neurological manifestations ([place]) 12/15/2015  . Chronic diastolic CHF (congestive heart failure) ([place])   . Pulmonary hypertension ([place])   . Urinary retention   . Diskitis   . Foot drop, bilateral   . Sepsis with acute renal failure without septic shock ([place])   . MRSA bacteremia 10/03/2015  . COPD (chronic obstructive pulmonary disease) ([place]) 10/03/2015  . Acute osteomyelitis of left foot ([place]) 10/03/2015    Orientation RESPIRATION BLADDER Height & Weight     Self, Situation, Time, Place  Normal Continent, External catheter Weight:   Height:     BEHAVIORAL SYMPTOMS/MOOD NEUROLOGICAL BOWEL NUTRITION STATUS      Continent Diet(carb modified; thin liquids)  AMBULATORY STATUS COMMUNICATION OF NEEDS Skin   Extensive Assist Verbally Surgical wounds, Wound Vac, Other (Comment)(amputation R leg with prevena wound vac; L great toe wound with gauze; cellulitis bilateral legs)                       Personal Care Assistance Level of Assistance  Bathing, Feeding, Dressing Bathing Assistance: Limited assistance Feeding assistance: Independent Dressing Assistance: Limited assistance     Functional Limitations Info  Sight, Hearing, Speech Sight Info: Impaired Hearing Info: Adequate Speech Info: Adequate    SPECIAL CARE FACTORS FREQUENCY  OT (By licensed OT), PT (By licensed PT)     PT Frequency: 5x  week OT Frequency: 5x week            Contractures Contractures Info: Not present    Additional Factors Info  Code Status, Allergies, Insulin Sliding Scale, Isolation Precautions Code Status Info: Full Code Allergies Info: Latex Psychotropic Info: buPROPion (WELLBUTRIN XL) 24 hr tablet 300 mg daily at bedtime PO; PARoxetine (PAXIL) tablet 20 mg daily at  bedtime PO Insulin Sliding Scale Info: insulin aspart (novoLOG) injection 0-9 Units 3x daily with meals Isolation Precautions Info: MRSA     Current Medications (12/07/2018):  This is the current hospital active medication list Current Facility-Administered Medications  Medication Dose Route Frequency Provider Last Rate Last Dose  . acetaminophen (TYLENOL) tablet 325-650 mg  325-650 mg Oral Q6H PRN [name], Gregory Mends, [name]   325 mg at 12/07/18 1128  . albuterol (PROVENTIL) (2.5 MG/3ML) 0.083% nebulizer solution 3 mL  3 mL Inhalation Q6H PRN [name], Gregory Mends, [name]      . amLODipine (NORVASC) tablet 10 mg  10 mg Oral Daily [name], Gregory Mends, [name]   10 mg at 12/07/18 1023  . atorvastatin (LIPITOR) tablet 20 mg  20 mg Oral QHS [name], Gregory Mends, [name]   20 mg at 12/06/18 2141  . bisacodyl (DULCOLAX) suppository 10 mg  10 mg Rectal Daily PRN [name], Gregory Mends, [name]      . buPROPion (WELLBUTRIN XL) 24 hr tablet 300 mg  300 mg Oral QHS [name], Gregory Mends, [name]   300 mg at 12/06/18 2140  . calcium carbonate (TUMS - dosed in mg elemental calcium) chewable tablet 400 mg of elemental calcium  2 tablet Oral Q8H PRN [name], Gregory Mends, [name]      . docusate sodium (COLACE) capsule 100 mg  100 mg Oral BID [name], Gregory Mends, [name]   100 mg at 12/07/18 1023  . ferrous sulfate tablet 325 mg  325 mg Oral TID PC [name], Gregory Montgomery, [name]   325 mg at 12/07/18 1023  . finasteride (PROSCAR) tablet 5 mg  5 mg Oral Daily [name], Gregory Mends, [name]   5 mg at 12/07/18 1023  . furosemide (LASIX) tablet 40 mg  40 mg Oral Daily [name], Gregory Mends, [name]   40 mg at 12/07/18 1023  . glipiZIDE (GLUCOTROL) tablet 10 mg  10 mg Oral QAC breakfast [name], Gregory Mends, [name]   10 mg at 12/07/18 0804  . insulin aspart (novoLOG) injection 0-9 Units  0-9 Units Subcutaneous TID WC [name], Gregory Mends, [name]   5 Units at 12/07/18 1610  . loratadine  (CLARITIN) tablet 10 mg  10 mg Oral Daily [name], Gregory Mends, [name]   10 mg at 12/07/18 1023  . magnesium oxide (MAG-OX) tablet 400 mg  400 mg Oral BID [name], Gregory Mends, [name]   400 mg at 12/07/18 1026  . methocarbamol (ROBAXIN) tablet 500 mg  500 mg Oral Q6H PRN [name], Gregory Mends, [name]   500 mg at 12/07/18 1128   Or  . methocarbamol (ROBAXIN) 500 mg in dextrose 5 % 50 mL IVPB  500 mg Intravenous Q6H PRN [name], Gregory Mends, [name]      . metoCLOPramide (REGLAN) tablet 5-10 mg  5-10 mg Oral Q8H PRN [name], Gregory Mends, [name]       Or  . metoCLOPramide (REGLAN) injection 5-10 mg  5-10 mg Intravenous Q8H PRN [name], Gregory Mends, [name]      . metoprolol tartrate (LOPRESSOR) tablet 50 mg  50 mg Oral Daily [name], Gregory Mends, [name]   50 mg  at 12/07/18 1023  . mirabegron ER (MYRBETRIQ) tablet 50 mg  50 mg Oral Daily [name], Gregory Mends, [name]   50 mg at 12/07/18 1023  . morphine (MS CONTIN) 12 hr tablet 15 mg  15 mg Oral Q12H [name], Gregory Mends, [name]   15 mg at 12/07/18 1023  . nicotine (NICODERM CQ - dosed in mg/24 hours) patch 14 mg  14 mg Transdermal QHS [name], Gregory Mends, [name]   14 mg at 12/06/18 2141  . ondansetron (ZOFRAN) tablet 4 mg  4 mg Oral Q6H PRN [name], Gregory Mends, [name]   4 mg at 12/05/18 1851   Or  . ondansetron (ZOFRAN) injection 4 mg  4 mg Intravenous Q6H PRN [name], Gregory Mends, [name]   4 mg at 12/06/18 0034  . oxybutynin (DITROPAN XL) 24 hr tablet 15 mg  15 mg Oral QHS [name], Gregory Mends, [name]   15 mg at 12/06/18 2141  . oxyCODONE (Oxy IR/ROXICODONE) immediate release tablet 20 mg  20 mg Oral Q6H PRN Gregory Minion, MD   20 mg at 12/07/18 0804  . pantoprazole (PROTONIX) EC tablet 40 mg  40 mg Oral Daily [name], Gregory Mends, [name]   40 mg at 12/07/18 1023  . PARoxetine (PAXIL) tablet 20 mg  20 mg Oral QHS [name], Gregory Mends, [name]   20 mg at 12/06/18 2141  . polyethylene glycol (MIRALAX /  GLYCOLAX) packet 17 g  17 g Oral Daily PRN [name], Gregory Mends, [name]      . pregabalin (LYRICA) capsule 150 mg  150 mg Oral BID [name], Gregory Mends, [name]   150 mg at 12/07/18 1023  . senna-docusate (Senokot-S) tablet 2 tablet  2 tablet Oral QHS [name], Gregory Mends, [name]   2 tablet at 12/06/18 2140  . tamsulosin (FLOMAX) capsule 0.8 mg  0.8 mg Oral QPM [name], Gregory Mends, [name]   0.8 mg at 12/06/18 1759  . terbinafine (LAMISIL) tablet 250 mg  250 mg Oral Daily [name], Gregory Mends, [name]   250 mg at 12/07/18 1024  . traMADol (ULTRAM) tablet 50 mg  50 mg Oral Q6H [name], Gregory Montgomery, [name]   50 mg at 12/07/18 0513  . umeclidinium bromide (INCRUSE ELLIPTA) 62.5 MCG/INH 1 puff  1 puff Inhalation Daily Gregory Minion, MD   1 puff at 12/06/18 0830  . zolpidem (AMBIEN) tablet 5 mg  5 mg Oral QHS PRN Aundra Dubin, [name]   5 mg at 12/07/18 0032     Discharge Medications: Please see discharge summary for a list of discharge medications.  Relevant Imaging Results:  Relevant Lab Results:   Additional Information SS#:[id number] [place], [place]

## 2018-12-08 ENCOUNTER — Encounter (HOSPITAL_COMMUNITY): Payer: Self-pay | Admitting: Orthopedic Surgery

## 2018-12-08 LAB — GLUCOSE, CAPILLARY
Glucose-Capillary: 124 mg/dL — ABNORMAL HIGH (ref 70–99)
Glucose-Capillary: 248 mg/dL — ABNORMAL HIGH (ref 70–99)
Glucose-Capillary: 145 mg/dL — ABNORMAL HIGH (ref 70–99)
Glucose-Capillary: 186 mg/dL — ABNORMAL HIGH (ref 70–99)

## 2018-12-08 MED ORDER — MUPIROCIN 2 % EX OINT
1.0000 "application " | TOPICAL_OINTMENT | Freq: Two times a day (BID) | CUTANEOUS | Status: DC
  Administered 2018-12-08 – 2018-12-09 (×4): 1 via NASAL
  Filled 2018-12-08: qty 22

## 2018-12-08 MED ORDER — KETOROLAC TROMETHAMINE 15 MG/ML IJ SOLN
15.0000 mg | Freq: Four times a day (QID) | INTRAMUSCULAR | Status: AC
  Administered 2018-12-08 – 2018-12-09 (×4): 15 mg via INTRAVENOUS
  Filled 2018-12-08 (×5): qty 1

## 2018-12-08 MED ORDER — CHLORHEXIDINE GLUCONATE CLOTH 2 % EX PADS
6.0000 | MEDICATED_PAD | Freq: Every day | CUTANEOUS | Status: DC
  Administered 2018-12-08 – 2018-12-09 (×2): 6 via TOPICAL

## 2018-12-08 NOTE — Progress Notes (Signed)
New dressing applied to right stump as ordered. Pt tolerated well. Wound vac in place and in working order. No leak detected. Pt instructed not to peel at dressing. Pt verbalized understanding. Will continue to monitor.

## 2018-12-08 NOTE — Progress Notes (Signed)
During RN hourly rounding, RN found wound vac unattached to pt. Pt stated he just removed wound vac "because it was just hurting so bad". No drainage at this time. [name], MD aware. New orders to follow. Will continue to monitor.

## 2018-12-08 NOTE — Progress Notes (Signed)
Pt complaining of pain 10/10. Pt refusing Tylenol at this time. No other PRN pain meds available. [name], MD notified. New orders to follow. Will continue to monitor.

## 2018-12-08 NOTE — Progress Notes (Signed)
CSW was notified by [facility] that patient needs updated COVID test within 48 hours of returning to SNF. Last test done on 6/19, patient will dc tomorrow back to [facility].  Patient will need updated COVID SARS test, CSW notified nurse.   [place], [place]

## 2018-12-08 NOTE — Progress Notes (Signed)
Pt refused this RN to update pt's daughter of pt status. Pt stated "I just talked to her a little bit ago". Will continue to monitor.

## 2018-12-08 NOTE — Progress Notes (Signed)
Patient ID: Gregory [name], male   DOB: [birth year], 58 y.o.   MRN: [id number] Patient resting comfortably this morning upon awakening patient states that he is in excruciating pain.  Examination the wound VAC canister shows 200 cc of drainage.  Will discharge when the drainage has slowed down.  Anticipate discharge to skilled nursing tomorrow.

## 2018-12-08 NOTE — Progress Notes (Signed)
Per [place] Nurse, supplies ordered for incisional VAC to stump. Awaiting delivery. Will continue to monitor.

## 2018-12-08 NOTE — Consult Note (Signed)
[place] Nurse wound consult note Patient receiving care in [facility] 5N30. I spoke with his primary RN, Steffanie Crago, via telephone. Reason for Consult: Placement of incision VAC to stump. Patient removed the one he had this morning due to the pain he was experiencing. Wound type: Surgical I have entered orders so the nurses will know what supplies to order.  I have also entered step-by-step directions for placement of incision VAC therapy.  Bedside nurse to place.  Thank you for the consult.  Discussed plan of care with the bedside nurse.  Val Riles, RN, MSN, CWOCN, CNS-BC, pager [phone]

## 2018-12-08 NOTE — Plan of Care (Signed)
Problem: Education: Goal: Knowledge of General Education information will improve Description: Including pain rating scale, medication(s)/side effects and non-pharmacologic comfort measures Outcome: Progressing   Problem: Health Behavior/Discharge Planning: Goal: Ability to manage health-related needs will improve Outcome: Progressing   Problem: Clinical Measurements: Goal: Ability to maintain clinical measurements within normal limits will improve Outcome: Progressing  Goal: Respiratory complications will improve Outcome: Progressing  Goal: Cardiovascular complication will be avoided Outcome: Progressing   Problem: Nutrition: Goal: Adequate nutrition will be maintained Outcome: Progressing   Problem: Coping: Goal: Level of anxiety will decrease Outcome: Progressing   Problem: Elimination: Goal: Will not experience complications related to urinary retention Outcome: Progressing   Problem: Pain Managment: Goal: General experience of comfort will improve Outcome: Progressing   Problem: Safety: Goal: Ability to remain free from injury will improve Outcome: Progressing   Problem: Skin Integrity: Goal: Risk for impaired skin integrity will decrease Outcome: Progressing   

## 2018-12-09 ENCOUNTER — Ambulatory Visit: Payer: Medicaid Other | Admitting: Orthopedic Surgery

## 2018-12-09 LAB — GLUCOSE, CAPILLARY
Glucose-Capillary: 185 mg/dL — ABNORMAL HIGH (ref 70–99)
Glucose-Capillary: 150 mg/dL — ABNORMAL HIGH (ref 70–99)
Glucose-Capillary: 187 mg/dL — ABNORMAL HIGH (ref 70–99)
Glucose-Capillary: 118 mg/dL — ABNORMAL HIGH (ref 70–99)

## 2018-12-09 LAB — NOVEL CORONAVIRUS, NAA (HOSP ORDER, SEND-OUT TO REF LAB; TAT 18-24 HRS): SARS-CoV-2, NAA: NOT DETECTED

## 2018-12-09 MED ORDER — OXYCODONE HCL 20 MG PO TABS: 20.0000 mg | ORAL_TABLET | Freq: Four times a day (QID) | ORAL | 0 refills | Status: DC | PRN

## 2018-12-09 NOTE — Progress Notes (Signed)
[name] [place] yellow watch returned to him from security.  AVS, signed printed prescription, and social worker's paperwork placed in discharge packet. Report called and given to Angela Burke, LPN at [facility]. All questions answered to satisfaction. [name] wound vac in place and in working order. Awaiting PTAR for transportation.   Per Mardene Celeste with scheduling at [place], transportation from [place] will come pick up pt's personal wheelchair tomorrow 12/10/2018. Pt aware. Will continue to monitor.

## 2018-12-09 NOTE — Progress Notes (Signed)
Pending updated COVID test results patient can return to [facility]. Test done on 6/22, results continue to be pending.   Gregory [name], Gregory [name]

## 2018-12-09 NOTE — Plan of Care (Signed)
Problem: Education: Goal: Knowledge of General Education information will improve Description Including pain rating scale, medication(s)/side effects and non-pharmacologic comfort measures Outcome: Progressing   Problem: Health Behavior/Discharge Planning: Goal: Ability to manage health-related needs will improve Outcome: Progressing   Problem: Clinical Measurements: Goal: Ability to maintain clinical measurements within normal limits will improve Outcome: Progressing Goal: Will remain free from infection Outcome: Progressing Goal: Respiratory complications will improve Outcome: Progressing Goal: Cardiovascular complication will be avoided Outcome: Progressing   Problem: Activity: Goal: Risk for activity intolerance will decrease Outcome: Progressing   Problem: Nutrition: Goal: Adequate nutrition will be maintained Outcome: Progressing   Problem: Coping: Goal: Level of anxiety will decrease Outcome: Progressing   Problem: Elimination: Goal: Will not experience complications related to urinary retention Outcome: Progressing   Problem: Pain Managment: Goal: General experience of comfort will improve Outcome: Progressing   Problem: Safety: Goal: Ability to remain free from injury will improve Outcome: Progressing   Problem: Skin Integrity: Goal: Risk for impaired skin integrity will decrease Outcome: Progressing   

## 2018-12-09 NOTE — Discharge Summary (Signed)
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 male who was admitted 12/05/2018 with a chief complaint of dehiscence right above knee amputation, with a final diagnosis of Dehiscence Right Above Knee Amputation.  Patient was brought to the operating room on 12/05/2018 and underwent Procedure(s): Revision Right Above Knee Amputation.    Patient was given perioperative antibiotics:  Anti-infectives (From admission, onward)   Start     Dose/Rate Route Frequency Ordered Stop   12/06/18 1000  terbinafine (LAMISIL) tablet 250 mg     250 mg Oral Daily 12/05/18 1755     12/05/18 0700  ceFAZolin (ANCEF) IVPB 2g/100 mL premix     2 g 200 mL/hr over 30 Minutes Intravenous On call to O.R. 12/05/18 0645 12/05/18 1123    .  Patient was given sequential compression devices, early ambulation, and aspirin for DVT prophylaxis.  Recent vital signs:  Patient Vitals for the past 24 hrs:  BP Temp Temp src Pulse Resp SpO2  12/09/18 0437 126/64 98.6 F (37 C) Oral (!) 54 16 96 %  12/08/18 1921 140/67 97.8 F (36.6 C) Oral (!) 58 16 98 %  12/08/18 1543 137/65 (!) 97.5 F (36.4 C) Oral (!) 59 16 100 %  12/08/18 0911 - - - 66 16 95 %  12/08/18 0804 125/72 98.6 F (37 C) Oral 60 16 95 %  .  Recent laboratory studies: No results found.  Discharge Medications:   Allergies as of 12/09/2018      Reactions   Latex Other (See Comments)   Listed on MAR- pt sts he is not allergic.      Medication List    TAKE these medications   acetaminophen 325 MG tablet Commonly known as: TYLENOL Take 650 mg by mouth every 4 (four) hours as needed (general discomfort).   albuterol 108 (90 Base) MCG/ACT inhaler Commonly known as: VENTOLIN HFA Inhale 2 puffs into the lungs every 6 (six)  hours as needed for wheezing or shortness of breath.   amLODipine 10 MG tablet Commonly known as: NORVASC Take 10 mg by mouth daily. for HTN   atorvastatin 20 MG tablet Commonly known as: LIPITOR Take 20 mg by mouth at bedtime.   Biofreeze 4 % Gel Generic drug: Menthol (Topical Analgesic) Apply 1 application topically every 4 (four) hours as needed (pain). Apply to right stump   bisacodyl 10 MG suppository Commonly known as: DULCOLAX Place 1 suppository (10 mg total) rectally daily as needed for moderate constipation.   buPROPion 300 MG 24 hr tablet Commonly known as: WELLBUTRIN XL Take 300 mg by mouth at bedtime.   calcium carbonate 500 MG chewable tablet Commonly known as: TUMS - dosed in mg elemental calcium Chew 2 tablets by mouth every 8 (eight) hours as needed for indigestion or heartburn.   cetirizine 10 MG tablet Commonly known as: ZYRTEC Take 10 mg by mouth daily.   CETYLPYRIDINIUM CHLORIDE MT Use as directed 15 mLs in the mouth or throat every 4 (four) hours as needed (dry mouth).   eszopiclone 1 MG Tabs tablet Commonly known as: LUNESTA Take 1 mg by mouth at bedtime. Take immediately before bedtime   ferrous sulfate 325 (65 FE) MG tablet Take 1 tablet (325 mg total) by mouth daily with breakfast.  finasteride 5 MG tablet Commonly known as: PROSCAR Take 5 mg by mouth daily.   furosemide 40 MG tablet Commonly known as: LASIX Take 40 mg by mouth daily.   glipiZIDE 10 MG tablet Commonly known as: GLUCOTROL Take 10 mg by mouth daily before breakfast.   Glucagon Emergency 1 MG injection Generic drug: glucagon Inject 1 mg into the muscle once as needed (for CBG <70).   guaiFENesin 600 MG 12 hr tablet Commonly known as: MUCINEX Take 600 mg by mouth every 12 (twelve) hours as needed (congestion).   HumaLOG KwikPen 100 UNIT/ML KwikPen Generic drug: insulin lispro Inject 0-10 Units into the skin See admin instructions. Inject 0-10 units per sliding scale  3 times daily before meals; CBG <70 = notify MD, 0-150 = 0 units, 151-200 = 2 units, 201-250 = 4 units, 251-300 = 6 units, 301-350 = 8 units, 351-400 = 10 units, >400 = notify MD   lidocaine 5 % Commonly known as: LIDODERM Place 1 patch onto the skin daily. Apply to Right stump. Remove & Discard patch within 12 hours or as directed by MD   magnesium oxide 400 MG tablet Commonly known as: MAG-OX Take 400 mg by mouth 2 (two) times daily.   metoprolol tartrate 50 MG tablet Commonly known as: LOPRESSOR Take 50 mg by mouth daily.   mirabegron ER 50 MG Tb24 tablet Commonly known as: MYRBETRIQ Take 50 mg by mouth daily.   morphine 15 MG 12 hr tablet Commonly known as: MS CONTIN Take 15 mg by mouth every 12 (twelve) hours.   Nasonex 50 MCG/ACT nasal spray Generic drug: mometasone Place 2 sprays into the nose at bedtime.   nicotine 14 mg/24hr patch Commonly known as: NICODERM CQ - dosed in mg/24 hours Place 14 mg onto the skin daily.   omeprazole 40 MG capsule Commonly known as: PRILOSEC Take 40 mg by mouth daily before breakfast.   ondansetron 4 MG tablet Commonly known as: ZOFRAN Take 4 mg by mouth every 6 (six) hours as needed for nausea or vomiting.   oxybutynin 15 MG 24 hr tablet Commonly known as: DITROPAN XL Take 15 mg by mouth at bedtime.   Oxycodone HCl 20 MG Tabs Take 1 tablet (20 mg total) by mouth every 6 (six) hours as needed (pain).   PARoxetine 20 MG tablet Commonly known as: PAXIL Take 20 mg by mouth at bedtime.   polyethylene glycol 17 g packet Commonly known as: MIRALAX / GLYCOLAX Take 17 g by mouth daily as needed for mild constipation.   pregabalin 150 MG capsule Commonly known as: LYRICA Take 1 capsule (150 mg total) by mouth 2 (two) times daily.   senna-docusate 8.6-50 MG tablet Commonly known as: Senokot-S Take 2 tablets by mouth at bedtime.   Spiriva Respimat 1.25 MCG/ACT Aers Generic drug: Tiotropium Bromide Monohydrate Inhale 2 puffs  into the lungs daily.   tamsulosin 0.4 MG Caps capsule Commonly known as: FLOMAX Take 0.8 mg by mouth every evening.   terbinafine 250 MG tablet Commonly known as: LAMISIL Take 250 mg by mouth daily.   tiZANidine 4 MG tablet Commonly known as: ZANAFLEX Take 4 mg by mouth every 8 (eight) hours as needed for muscle spasms.   vitamin B-12 500 MCG tablet Commonly known as: CYANOCOBALAMIN Take 1,000 mcg by mouth daily.   Vitamin D-3 125 MCG (5000 UT) Tabs Take 5,000 Units by mouth daily.       Diagnostic Studies: Dg Knee Complete 4 Views Right  Result  Date: 11/16/2018 CLINICAL DATA:  Infection and pain at amputation site below the right knee. Reported fall. EXAM: RIGHT KNEE - COMPLETE 4+ VIEW COMPARISON:  10/20/2018 right lower extremity radiographs FINDINGS: Status post right lower extremity amputation at the level of the proximal tibial shaft. New soft tissue swelling throughout amputation site. New soft tissue defect overlying the tibial stump. Curvilinear lucency at the anterolateral margin of the right tibial stump suggests a nondisplaced fracture. No new osseous erosions or new periosteal reaction. No suspicious focal osseous lesions. No right knee joint effusion. No radiopaque foreign body. IMPRESSION: New curvilinear lucency at the anterolateral margin of the right tibial stump, suggesting a nondisplaced fracture. New soft tissue swelling throughout the amputation site. New soft tissue defect overlying the tibial stump. No specific radiographic findings of acute osteomyelitis. Electronically Signed   By: Ilona Sorrel M.D.   On: 11/16/2018 15:09   Vas [place] Lower Extremity Venous (dvt)  Result Date: 11/18/2018  Lower Venous Study Indications: Edema, and Swelling.  Limitations: Poor ultrasound/tissue interface. Performing Technologist: Abram Sander RVS  Examination Guidelines: A complete evaluation includes B-mode imaging, spectral Doppler, color Doppler, and power Doppler as needed of all  accessible portions of each vessel. Bilateral testing is considered an integral part of a complete examination. Limited examinations for reoccurring indications may be performed as noted.  +-----+---------------+---------+-----------+----------+-------+ RIGHTCompressibilityPhasicitySpontaneityPropertiesSummary +-----+---------------+---------+-----------+----------+-------+ CFV  Full           Yes      Yes                          +-----+---------------+---------+-----------+----------+-------+   +---------+---------------+---------+-----------+----------+--------------+ LEFT     CompressibilityPhasicitySpontaneityPropertiesSummary        +---------+---------------+---------+-----------+----------+--------------+ CFV      Full                                                        +---------+---------------+---------+-----------+----------+--------------+ SFJ      Full                                                        +---------+---------------+---------+-----------+----------+--------------+ FV Prox  Full                                                        +---------+---------------+---------+-----------+----------+--------------+ FV Mid   Full                                                        +---------+---------------+---------+-----------+----------+--------------+ FV DistalFull                                                        +---------+---------------+---------+-----------+----------+--------------+  PFV      Full                                                        +---------+---------------+---------+-----------+----------+--------------+ POP      Full           Yes      Yes                                 +---------+---------------+---------+-----------+----------+--------------+ PTV                                                   Not visualized  +---------+---------------+---------+-----------+----------+--------------+ PERO                                                  Not visualized +---------+---------------+---------+-----------+----------+--------------+     Summary: Right: No evidence of common femoral vein obstruction. Left: There is no evidence of deep vein thrombosis in the lower extremity. However, portions of this examination were limited- see technologist comments above. No cystic structure found in the popliteal fossa.  *See table(s) above for measurements and observations. Electronically signed by Deitra Mayo MD on 11/18/2018 at 12:53:12 PM.    Final     Patient benefited maximally from their hospital stay and there were no complications.     Disposition: Discharge disposition: 03-Skilled Nursing Facility      Discharge Instructions    Call MD / Call 911   Complete by: As directed    If you experience chest pain or shortness of breath, CALL 911 and be transported to the hospital emergency room.  If you develope a fever above 101 F, pus (white drainage) or increased drainage or redness at the wound, or calf pain, call your surgeon's office.   Constipation Prevention   Complete by: As directed    Drink plenty of fluids.  Prune juice may be helpful.  You may use a stool softener, such as Colace (over the counter) 100 mg twice a day.  Use MiraLax (over the counter) for constipation as needed.   Diet - low sodium heart healthy   Complete by: As directed    Increase activity slowly as tolerated   Complete by: As directed    Negative Pressure Wound Therapy - Incisional   Complete by: As directed       Contact information for follow-up providers    [name], MD In 1 week.   Specialty: Orthopedic Surgery Contact information: [place] [id number] [phone]            Contact information for after-discharge care    Destination    [facility] .    Service: Skilled Nursing Contact information: [place] [phone]                   Signed: [name] 12/09/2018, 7:06 AM

## 2018-12-09 NOTE — Progress Notes (Signed)
Physical Therapy Treatment Patient Details Name: Gregory [name] MRN: [id number] DOB: Sep 09, 1960 Today's Date: 12/09/2018    History of Present Illness  58 y.o. 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 pt with revision of R AKA 12/05/18. PMH includes HF, DM, peripheral neuropathy, COPD, chronic pain, L eye blindness, h/o multiple amputations with revisions (most recently 10/22/18).    PT Comments    Pt received in bed. Declining OOB due to pain but agreeable to bed exercises. Pt performed BLE exercises in supine. He reports he has been transferring to his w/c with nursing assist. Plan for transfer back to SNF after covid test results received.    Follow Up Recommendations  SNF     Equipment Recommendations  None recommended by PT    Recommendations for Other Services       Precautions / Restrictions Precautions Precautions: Fall;Other (comment) Precaution Comments: R AKA with wound vac Restrictions RLE Weight Bearing: Non weight bearing    Mobility  Bed Mobility Overal bed mobility: Needs Assistance Bed Mobility: Supine to Sit;Sit to Supine;Rolling Rolling: Modified independent (Device/Increase time)   Supine to sit: Min guard Sit to supine: Min guard   General bed mobility comments: +rail, increased time and effort, min guard for safety/lines for sup<>sit  Transfers                 General transfer comment: Pt declining OOB due to pain.  Ambulation/Gait                 Stairs             Wheelchair Mobility    Modified Rankin (Stroke Patients Only)       Balance                                            Cognition Arousal/Alertness: Awake/alert Behavior During Therapy: WFL for tasks assessed/performed Overall Cognitive Status: Within Functional Limits for tasks assessed                                        Exercises Amputee Exercises Gluteal Sets: AROM;Both;10  reps;Supine Hip Extension: AROM;Right;10 reps;Supine Hip ABduction/ADduction: AROM;Right;Left;10 reps;Supine Knee Flexion: AROM;Left;10 reps;Supine    General Comments        Pertinent Vitals/Pain Pain Assessment: 0-10 Pain Score: 8  Pain Location: R AKA site Pain Descriptors / Indicators: Operative site guarding;Grimacing;Discomfort Pain Intervention(s): Monitored during session;Limited activity within patient's tolerance    Home Living                      Prior Function            PT Goals (current goals can now be found in the care plan section) Acute Rehab PT Goals Patient Stated Goal: return to SNF and not start smoking again PT Goal Formulation: With patient Time For Goal Achievement: 12/20/18 Potential to Achieve Goals: Good Progress towards PT goals: Progressing toward goals    Frequency    Min 2X/week      PT Plan Current plan remains appropriate    Co-evaluation              AM-PAC PT "6 Clicks" Mobility   Outcome Measure  Help needed  turning from your back to your side while in a flat bed without using bedrails?: None Help needed moving from lying on your back to sitting on the side of a flat bed without using bedrails?: A Little Help needed moving to and from a bed to a chair (including a wheelchair)?: A Little Help needed standing up from a chair using your arms (e.g., wheelchair or bedside chair)?: A Little Help needed to walk in hospital room?: Total Help needed climbing 3-5 steps with a railing? : Total 6 Click Score: 15    End of Session   Activity Tolerance: Patient tolerated treatment well Patient left: in bed;with call bell/phone within reach;with bed alarm set Nurse Communication: Mobility status PT Visit Diagnosis: Other abnormalities of gait and mobility (R26.89)     Time: 9390-3009 PT Time Calculation (min) (ACUTE ONLY): 10 min  Charges:  $Therapeutic Exercise: 8-22 mins                     Lorrin Goodell, PT   Office # [phone] Pager [phone]    Lorriane Shire 12/09/2018, 10:46 AM

## 2018-12-09 NOTE — TOC Transition Note (Signed)
Transition of Care [facility]) - CM/SW Discharge Note   Patient Details  Name: Gregory [name] MRN: [id number] Date of Birth: 1960/09/18  Transition of Care [facility]) CM/SW Contact:  Alberteen Sam, [place] Phone Number: [phone] 12/09/2018, 3:54 PM   Clinical Narrative:     Patient will DC to: [facility] Anticipated DC date: 12/09/2018 Family notified: patient will notify Transport [id number]  Per MD patient ready for DC to [place]. RN, patient, patient's family, and facility notified of DC. Discharge Summary sent to facility. RN given number for report  [phone]. DC packet on chart. Ambulance transport requested for patient.  CSW signing off.  [place], [place]   Final next level of care: Skilled Nursing Facility Barriers to Discharge: No Barriers Identified   Patient Goals and CMS Choice Patient states their goals for this hospitalization and ongoing recovery are:: return to [place] CMS Medicare.gov Compare Post Acute Care list provided to:: Patient Choice offered to / list presented to : Patient  Discharge Placement PASRR number recieved: 12/07/18            Patient chooses bed at: Other - please specify in the comment section below:([facility]) Patient to be transferred to facility by: [place] Name of family member notified: Patient will contact Patient and family notified of of transfer: 12/09/18  Discharge Plan and Services                                     Social Determinants of Health ([place]) Interventions     Readmission Risk Interventions Readmission Risk Prevention Plan 12/07/2018 11/20/2018  Transportation Screening Complete Complete  Medication Review Press photographer) Complete Complete  PCP or Specialist appointment within 3-5 days of discharge Complete Complete  HRI or [place] Not Complete Not Complete  HRI or Home Care Consult Pt Refusal Comments pt from SNF LTC pt is LTC SNF resident  SW Recovery Care/Counseling Consult  Complete Complete  Palliative Care Screening Not Applicable Not Applicable  Skilled Nursing Facility Complete Complete  Some recent data might be hidden

## 2018-12-09 NOTE — Progress Notes (Signed)
Pt refused this RN to update pt's daughter of pt status. Pt stated "I already told her I am leaving today". Will continue to monitor.

## 2018-12-15 ENCOUNTER — Other Ambulatory Visit: Payer: Self-pay

## 2018-12-15 ENCOUNTER — Ambulatory Visit (INDEPENDENT_AMBULATORY_CARE_PROVIDER_SITE_OTHER): Payer: Medicaid Other | Admitting: Orthopedic Surgery

## 2018-12-15 ENCOUNTER — Telehealth: Payer: Self-pay

## 2018-12-15 ENCOUNTER — Encounter: Payer: Self-pay | Admitting: Physician Assistant

## 2018-12-15 VITALS — Ht 72.0 in | Wt 245.0 lb

## 2018-12-15 DIAGNOSIS — Z89511 Acquired absence of right leg below knee: Secondary | ICD-10-CM

## 2018-12-15 NOTE — Telephone Encounter (Signed)
Pt has an appt today. Nurse with [facility] called to advise that the pt has been " bathing in urine" he has been voiding in a urinal and then pouring the urine over his incision. The pt removed his wound vac, refuses dressing cahnges or when they are applied he removes them. The pt is squeezing his incision and " milking it" Nurse states that he has been applying pressure to open the incision> He is asking for pain medication and believes that the pt is being non compliant and injurious to his surgical site so that he will obtain pain medication. Advised that this was documented in the pt's chart. [name] has been made aware and will discuss with pt during his office visit today.

## 2018-12-15 NOTE — Progress Notes (Signed)
Office Visit Note   Patient: Gregory [name]           Date of Birth: July 25, 1960           MRN: [id number] Visit Date: 12/15/2018              Requested by: [name], MD [place],  [place] [address] PCP: [name], MD  Chief Complaint  Patient presents with  . Right Leg - Routine Post Op    12/05/18 revision right above the knee amputation       HPI: Patient is a 58 year old gentleman status post revision above-the-knee amputation of the right patient is currently in skilled nursing.  We received a phone call from skilled nursing stating that patient pulled off his wound VAC dressing and that patient has been manipulating the wound and that the patient poured urine from a urinal over the incision.  Patient also was outside the office smoking and would not come into the office until he finished smoking.  Assessment & Plan: Visit Diagnoses:  1. S/P below knee amputation, right ([place])     Plan: Discussed with the patient the importance of hygiene and compliance for the wound to heal.  Discussed that the wound VAC was providing an important feature for wound healing.  Discussed the importance of not manipulating the wound and not touching the wound or getting anything on the wound.  Recommended that he continue the Keflex.  Dry dressing change daily.  Follow-Up Instructions: Return in about 1 week (around 12/22/2018).   Ortho Exam  Patient is alert, oriented, no adenopathy, well-dressed, normal affect, normal respiratory effort. The incision does have clear drainage there is redness around the sutures there is increased swelling.  Imaging: No results found.   Labs: Lab Results  Component Value Date   HGBA1C 7.7 (H) 11/17/2018   HGBA1C 8.0 (H) 06/04/2018   HGBA1C 7.9 (H) 04/25/2018   ESRSEDRATE 120 (H) 03/25/2017   ESRSEDRATE 84 (H) 10/02/2016   ESRSEDRATE 132 (H) 12/01/2015   CRP 4.9 (H) 03/25/2017   CRP 30.6 (H) 10/02/2016   CRP 13.7 (H)  11/11/2015   REPTSTATUS 11/17/2018 FINAL 11/16/2018   GRAMSTAIN  06/06/2018    RARE WBC PRESENT, PREDOMINANTLY PMN FEW GRAM POSITIVE COCCI    CULT  11/16/2018    NO GROWTH Performed at [facility] [address] [place], [place] [address]    LABORGA PROVIDENCIA [name] (A) 01/12/2017     Lab Results  Component Value Date   ALBUMIN 3.3 (L) 10/22/2018   ALBUMIN 2.2 (L) 06/04/2018   ALBUMIN 2.8 (L) 06/03/2018   PREALBUMIN 23.3 03/25/2017    Body mass index is 33.23 kg/m.  Orders:  No orders of the defined types were placed in this encounter.  No orders of the defined types were placed in this encounter.    Procedures: No procedures performed  Clinical Data: No additional findings.  ROS:  All other systems negative, except as noted in the HPI. Review of Systems  Objective: Vital Signs: Ht 6' (1.829 m)   Wt 245 lb (111.1 kg)   BMI 33.23 kg/m   Specialty Comments:  No specialty comments available.  PMFS History: Patient Active Problem List   Diagnosis Date Noted  . Acquired absence of right lower extremity above knee ([place]) 12/05/2018  . Amputation stump infection ([place])   . Lymphedema of left leg   . Diabetic polyneuropathy associated with type 2 diabetes mellitus ([place])   .  Wound infection 11/16/2018  . Below-knee amputation of right lower extremity ([place]ch[place]) 10/22/2018  . Dehiscence of amputation stump ([place]ri[place])   . 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Jeann[place]) 08/12/2018  . Candidiasis 08/12/2018  . Necrosis of toe ([place]) 07/08/2018  . Amputated toe of left foot (Huntersville) 06/18/2018  . Streptococcal infection   . Pressure injury of skin 06/06/2018  . Cutaneous abscess of left foot   . Moderate protein-calorie malnutrition ([place])   . Cellulitis of left leg   . Septic arthritis ([place]) 06/03/2018  . Idiopathic chronic venous hypertension of left lower extremity with ulcer and inflammation ([place]raw[place]) 11/14/2017  . Great toe amputation status, left 11/14/2017  .  Acquired absence of right leg below knee ([place]) 06/20/2017  . Osteomyelitis ([place]) 03/25/2017  . Skin cancer 03/25/2017  . Depression, recurrent (P[place]) 11/05/2016  . Diabetic ulcer of toe of left foot ([place]) 10/02/2016  . 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 Chronic pain 09/27/2016  . Dyslipidemia associated with type 2 diabetes mellitus ([place]) 06/06/2016  . GERD without esophagitis 05/11/2016  . Hypertensive heart disease with congestive heart failure (Flint[place]) 12/15/2015  . Spinal stenosis in cervical region 12/15/2015  . Type II diabetes mellitus with neurological manifestations (An[place]) 12/15/2015  . Chronic diastolic CHF (congestive heart failure) (Ransom[place])   . Pulmonary hypertension ([place])   . Urinary retention   . Diskitis   . Foot drop, bilateral   . Sepsis with acute renal failure without septic shock ([place])   . MRSA bacteremia 10/03/2015  . COPD (chronic obstructive pulmonary disease) ([place]) 10/03/2015  . Acute osteomyelitis of left foot ([place] Point) 10/03/2015   Past Medical History:  Diagnosis Date  . Acquired absence of left great toe (Belmont)   . Acquired absence of right leg below knee ([place]win Lakes)   . Acute osteomyelitis of calcaneum, right (Spur[place]) 10/02/2016  . Acute respiratory failure ([place])   . Amputation stump infection ([place]) 08/12/2018  . Anemia   . Basal cell carcinoma, eyelid   . Cancer ([place])   . Candidiasis 08/12/2018  . CHF (congestive heart failure) (HCC)    chronic diastolic   . Chronic back pain   . Chronic kidney disease    stage 3   . Chronic multifocal osteomyelitis (HCC)    of ankle and foot   . Chronic pain syndrome   . COPD (chronic obstructive pulmonary disease) ([place] Beach)   . DDD (degenerative disc disease), lumbar   . Depression    major depressive disorder   . Diabetes mellitus without complication (Lakewood)    type 2   . Diabetic ulcer of toe of left foot ([place]) 10/02/2016  . Difficulty in walking, not elsewhere classified   . 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 Foot drop, bilateral   .  Foot drop, right 12/27/2015  . GERD (gastroesophageal reflux disease)   . Herpesviral vesicular dermatitis   . Hyperlipidemia   . Hyperlipidemia   . Hypertension   . Insomnia   . Malignant melanoma of other parts of face ([place]arranquitas)   . MRSA bacteremia   . Muscle weakness (generalized)   . Nasal congestion   . Necrosis of toe (Pingree Grove) 07/08/2018  . Neuromuscular dysfunction of bladder   . Osteomyelitis ([place] Bridge)   . Osteomyelitis ([place]autau[place])   . Osteomyelitis ([place] Green)    right BKA  . Other idiopathic peripheral autonomic neuropathy   . Retention of urine, unspecified   . Squamous cell carcinoma of skin   . Unsteadiness on feet   . Urinary retention   .  Urinary retention   . Urinary tract infection   . Wears dentures   . Wears glasses     Family History  Problem Relation Age of Onset  . Cancer Other   . Diabetes Other     Past Surgical History:  Procedure Laterality Date  . AMPUTATION Left 03/29/2017   Procedure: LEFT GREAT TOE AMPUTATION AT METATARSOPHALANGEAL JOINT;  Surgeon: [name], MD;  Location: [facility];  Service: Orthopedics;  Laterality: Left;  . AMPUTATION Right 03/29/2017   Procedure: RIGHT BELOW KNEE AMPUTATION;  Surgeon: [name], MD;  Location: [facility];  Service: Orthopedics;  Laterality: Right;  . AMPUTATION Left 06/06/2018   Procedure: LEFT 2ND TOE AMPUTATION;  Surgeon: [name], MD;  Location: [facility];  Service: Orthopedics;  Laterality: Left;  . AMPUTATION Right 11/19/2018   Procedure: AMPUTATION ABOVE KNEE;  Surgeon: [name], MD;  Location: [facility];  Service: Orthopedics;  Laterality: Right;  . ANTERIOR CERVICAL CORPECTOMY N/A 11/25/2015   Procedure: ANTERIOR CERVICAL FIVE CORPECTOMY Cervical four - six fusion;  Surgeon: Consuella Lose, MD;  Location: [facility];  Service: Neurosurgery;  Laterality: N/A;  ANTERIOR CERVICAL FIVE CORPECTOMY Cervical four - six fusion  . APPLICATION OF WOUND VAC Right 10/22/2018   Procedure: Application Of Wound Vac;  Surgeon:  [name], MD;  Location: [facility];  Service: Orthopedics;  Laterality: Right;  . CYSTOSCOPY WITH RETROGRADE URETHROGRAM N/A 04/25/2018   Procedure: CYSTOSCOPY WITH RETROGRADE URETHROGRAM/ BALLOON DILATION;  Surgeon: Kathie Rhodes, MD;  Location: [facility];  Service: Urology;  Laterality: N/A;  . MULTIPLE TOOTH EXTRACTIONS    . POSTERIOR CERVICAL FUSION/FORAMINOTOMY N/A 11/29/2015   Procedure: Cervical Three-Cervical Seven Posterior Cervical Laminectomy with Fusion;  Surgeon: Consuella Lose, MD;  Location: [facility];  Service: Neurosurgery;  Laterality: N/A;  Cervical Three-Cervical Seven Posterior Cervical Laminectomy with Fusion  . STUMP REVISION Right 10/22/2018   Procedure: REVISION RIGHT BELOW KNEE AMPUTATION;  Surgeon: [name], MD;  Location: [facility];  Service: Orthopedics;  Laterality: Right;  . STUMP REVISION Right 12/05/2018   Procedure: Revision Right Above Knee Amputation;  Surgeon: [name], MD;  Location: [facility];  Service: Orthopedics;  Laterality: Right;   Social History   Occupational History  . Not on file  Tobacco Use  . Smoking status: Current Every Day Smoker    Packs/day: 0.50    Types: Cigarettes  . Smokeless tobacco: Never Used  . Tobacco comment: 11/19/18  Substance and Sexual Activity  . Alcohol use: No  . Drug use: No  . Sexual activity: Not on file

## 2018-12-22 ENCOUNTER — Encounter: Payer: Self-pay | Admitting: Orthopedic Surgery

## 2018-12-22 ENCOUNTER — Ambulatory Visit (INDEPENDENT_AMBULATORY_CARE_PROVIDER_SITE_OTHER): Payer: Medicaid Other | Admitting: Physician Assistant

## 2018-12-22 ENCOUNTER — Other Ambulatory Visit: Payer: Self-pay

## 2018-12-22 VITALS — Ht 72.0 in | Wt 245.0 lb

## 2018-12-22 DIAGNOSIS — S78111A Complete traumatic amputation at level between right hip and knee, initial encounter: Secondary | ICD-10-CM

## 2018-12-22 DIAGNOSIS — E1142 Type 2 diabetes mellitus with diabetic polyneuropathy: Secondary | ICD-10-CM

## 2018-12-22 DIAGNOSIS — T8781 Dehiscence of amputation stump: Secondary | ICD-10-CM

## 2018-12-22 DIAGNOSIS — Z89611 Acquired absence of right leg above knee: Secondary | ICD-10-CM

## 2018-12-22 DIAGNOSIS — Z794 Long term (current) use of insulin: Secondary | ICD-10-CM

## 2018-12-22 NOTE — Progress Notes (Signed)
Office Visit Note   Patient: Gregory [name]           Date of Birth: 17-Jan-1961           MRN: [id number] Visit Date: 12/22/2018              Requested by: [name], MD [place],  [place] [id number] PCP: [name], MD  Chief Complaint  Patient presents with  . Right Leg - Routine Post Op    12/05/18 Revision Right AKA      HPI: The patient is a 58 yo gentleman who is seen for post operative follow up following a right above the knee amputation on 12/05/2018. He has developed some central dehiscence and drainage. He has been on Keflex. He remains in skilled nursing.   Assessment & Plan: Visit Diagnoses:  1. Unilateral AKA, right ([place])   2. Dehiscence of amputation stump ([place])   3. Type 2 diabetes mellitus with diabetic polyneuropathy, with long-term current use of insulin (HCC)     Plan: Orders sent to SNF for washing daily with soap and water and packing of dry gauze in the open area , cover with gauze and secure with tape. DC keflex and begin Doxycycline 100 mg BID.  Follow up in 2 weeks.   Follow-Up Instructions: Return in about 2 weeks (around 01/05/2019).   Ortho Exam  Patient is alert, oriented, no adenopathy, well-dressed, normal affect, normal respiratory effort. Sutures in place over the incision and under tension. There is dehiscence of the central incision ~ 3 x 2 cm with undermining to 1 cm. There is yellow tan mostly serous appearing drainage. The lateral and medial incision line is intact.   Imaging: No results found. No images are attached to the encounter.  Labs: Lab Results  Component Value Date   HGBA1C 7.7 (H) 11/17/2018   HGBA1C 8.0 (H) 06/04/2018   HGBA1C 7.9 (H) 04/25/2018   ESRSEDRATE 120 (H) 03/25/2017   ESRSEDRATE 84 (H) 10/02/2016   ESRSEDRATE 132 (H) 12/01/2015   CRP 4.9 (H) 03/25/2017   CRP 30.6 (H) 10/02/2016   CRP 13.7 (H) 11/11/2015   REPTSTATUS 11/17/2018 FINAL 11/16/2018   GRAMSTAIN  06/06/2018    RARE  WBC PRESENT, PREDOMINANTLY PMN FEW GRAM POSITIVE COCCI    CULT  11/16/2018    NO GROWTH Performed at [facility] [address] [place], [place] [address]    LABORGA PROVIDENCIA [name] (A) 01/12/2017     Lab Results  Component Value Date   ALBUMIN 3.3 (L) 10/22/2018   ALBUMIN 2.2 (L) 06/04/2018   ALBUMIN 2.8 (L) 06/03/2018   PREALBUMIN 23.3 03/25/2017    Lab Results  Component Value Date   MG 1.6 (L) 12/02/2015   MG 2.0 11/22/2015   MG 2.1 11/21/2015   No results found for: VD25OH  Lab Results  Component Value Date   PREALBUMIN 23.3 03/25/2017   CBC EXTENDED Latest Ref Rng & Units 12/06/2018 12/05/2018 11/21/2018  WBC 4.0 - 10.5 K/uL - 11.0(H) 6.5  RBC 4.22 - 5.81 MIL/uL - 3.11(L) 3.14(L)  HGB 13.0 - 17.0 g/dL 9.6(L) 7.7(L) 7.8(L)  HCT 39.0 - 52.0 % 31.3(L) 26.5(L) 26.4(L)  PLT 150 - 400 K/uL - 242 197  NEUTROABS 1.7 - 7.7 K/uL - - -  LYMPHSABS 0.7 - 4.0 K/uL - - -     Body mass index is 33.23 kg/m.  Orders:  No orders of the defined types were placed in  this encounter.  No orders of the defined types were placed in this encounter.    Procedures: No procedures performed  Clinical Data: No additional findings.  ROS:  All other systems negative, except as noted in the HPI. Review of Systems  Objective: Vital Signs: Ht 6' (1.829 m)   Wt 245 lb (111.1 kg)   BMI 33.23 kg/m   Specialty Comments:  No specialty comments available.  PMFS History: Patient Active Problem List   Diagnosis Date Noted  . Acquired absence of right lower extremity above knee ([place]) 12/05/2018  . Amputation stump infection ([place]khart)   . Lymphedema of left leg   . Diabetic polyneuropathy associated with type 2 diabetes mellitus (Crownsville)   . Wound infection 11/16/2018  . Below-knee amputation of right lower extremity (Ship Bottom) 10/22/2018  . Dehiscence of amputation stump ([place])   . 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Larimer) 08/12/2018  . Candidiasis 08/12/2018  . Necrosis of  toe (Dunfermline) 07/08/2018  . Amputated toe of left foot (Capulin) 06/18/2018  . Streptococcal infection   . Pressure injury of skin 06/06/2018  . Cutaneous abscess of left foot   . Moderate protein-calorie malnutrition ([place])   . Cellulitis of left leg   . Septic arthritis (Clif[place] Heights) 06/03/2018  . Idiopathic chronic venous hypertension of left lower extremity with ulcer and inflammation ([place]ayette) 11/14/2017  . Great toe amputation status, left 11/14/2017  . Acquired absence of right leg below knee ([place]) 06/20/2017  . Osteomyelitis ([place]) 03/25/2017  . Skin cancer 03/25/2017  . Depression, recurrent ([place] Len[place]) 11/05/2016  . Diabetic ulcer of toe of left foot ([place]) 10/02/2016  . 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 Chronic pain 09/27/2016  . Dyslipidemia associated with type 2 diabetes mellitus ([place]) 06/06/2016  . GERD without esophagitis 05/11/2016  . Hypertensive heart disease with congestive heart failure ([place]) 12/15/2015  . Spinal stenosis in cervical region 12/15/2015  . Type II diabetes mellitus with neurological manifestations (Glenville) 12/15/2015  . Chronic diastolic CHF (congestive heart failure) ([place]aranj[place])   . Pulmonary hypertension (Meyersdale)   . Urinary retention   . Diskitis   . Foot drop, bilateral   . Sepsis with acute renal failure without septic shock (Helmetta)   . MRSA bacteremia 10/03/2015  . COPD (chronic obstructive pulmonary disease) ([place]wick) 10/03/2015  . Acute osteomyelitis of left foot (Unionville) 10/03/2015   Past Medical History:  Diagnosis Date  . Acquired absence of left great toe (Whitsett)   . Acquired absence of right leg below knee (Molino)   . Acute osteomyelitis of calcaneum, right (Issaquah) 10/02/2016  . Acute respiratory failure ([place]per City)   . Amputation stump infection (Danville) 08/12/2018  . Anemia   . Basal cell carcinoma, eyelid   . Cancer ([place])   . Candidiasis 08/12/2018  . CHF (congestive heart failure) (HCC)    chronic diastolic   . Chronic back pain   . Chronic kidney disease    stage 3   . Chronic  multifocal osteomyelitis (HCC)    of ankle and foot   . Chronic pain syndrome   . COPD (chronic obstructive pulmonary disease) ([place])   . DDD (degenerative disc disease), lumbar   . Depression    major depressive disorder   . Diabetes mellitus without complication (Effie)    type 2   . Diabetic ulcer of toe of left foot (Fultondale) 10/02/2016  . Difficulty in walking, not elsewhere classified   . 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 Foot drop, bilateral   . Foot drop, right 12/27/2015  .  GERD (gastroesophageal reflux disease)   . Herpesviral vesicular dermatitis   . Hyperlipidemia   . Hyperlipidemia   . Hypertension   . Insomnia   . Malignant melanoma of other parts of face ([place]merce)   . MRSA bacteremia   . Muscle weakness (generalized)   . Nasal congestion   . Necrosis of toe (Sinai) 07/08/2018  . Neuromuscular dysfunction of bladder   . Osteomyelitis ([place])   . Osteomyelitis ([place] Fork)   . Osteomyelitis (McCl[place])    right BKA  . Other idiopathic peripheral autonomic neuropathy   . Retention of urine, unspecified   . Squamous cell carcinoma of skin   . Unsteadiness on feet   . Urinary retention   . Urinary retention   . Urinary tract infection   . Wears dentures   . Wears glasses     Family History  Problem Relation Age of Onset  . Cancer Other   . Diabetes Other     Past Surgical History:  Procedure Laterality Date  . AMPUTATION Left 03/29/2017   Procedure: LEFT GREAT TOE AMPUTATION AT METATARSOPHALANGEAL JOINT;  Surgeon: [name], MD;  Location: [facility];  Service: Orthopedics;  Laterality: Left;  . AMPUTATION Right 03/29/2017   Procedure: RIGHT BELOW KNEE AMPUTATION;  Surgeon: [name], MD;  Location: [facility];  Service: Orthopedics;  Laterality: Right;  . AMPUTATION Left 06/06/2018   Procedure: LEFT 2ND TOE AMPUTATION;  Surgeon: [name], MD;  Location: [facility];  Service: Orthopedics;  Laterality: Left;  . AMPUTATION Right 11/19/2018   Procedure: AMPUTATION ABOVE KNEE;  Surgeon: [name], MD;  Location: [facility];  Service: Orthopedics;  Laterality: Right;  . ANTERIOR CERVICAL CORPECTOMY N/A 11/25/2015   Procedure: ANTERIOR CERVICAL FIVE CORPECTOMY Cervical four - six fusion;  Surgeon: Consuella Lose, MD;  Location: [facility];  Service: Neurosurgery;  Laterality: N/A;  ANTERIOR CERVICAL FIVE CORPECTOMY Cervical four - six fusion  . APPLICATION OF WOUND VAC Right 10/22/2018   Procedure: Application Of Wound Vac;  Surgeon: [name], MD;  Location: [facility];  Service: Orthopedics;  Laterality: Right;  . CYSTOSCOPY WITH RETROGRADE URETHROGRAM N/A 04/25/2018   Procedure: CYSTOSCOPY WITH RETROGRADE URETHROGRAM/ BALLOON DILATION;  Surgeon: Kathie Rhodes, MD;  [facility];  Service: Urology;  Laterality: N/A;  . MULTIPLE TOOTH EXTRACTIONS    . POSTERIOR CERVICAL FUSION/FORAMINOTOMY N/A 11/29/2015   Procedure: Cervical Three-Cervical Seven Posterior Cervical Laminectomy with Fusion;  Surgeon: Consuella Lose, MD;  Location: [facility];  Service: Neurosurgery;  Laterality: N/A;  Cervical Three-Cervical Seven Posterior Cervical Laminectomy with Fusion  . STUMP REVISION Right 10/22/2018   Procedure: REVISION RIGHT BELOW KNEE AMPUTATION;  Surgeon: [name], MD;  Location: [facility];  Service: Orthopedics;  Laterality: Right;  . STUMP REVISION Right 12/05/2018   Procedure: Revision Right Above Knee Amputation;  Surgeon: [name], MD;  Location: [facility];  Service: Orthopedics;  Laterality: Right;   Social History   Occupational History  . Not on file  Tobacco Use  . Smoking status: Current Every Day Smoker    Packs/day: 0.50    Types: Cigarettes  . Smokeless tobacco: Never Used  . Tobacco comment: 11/19/18  Substance and Sexual Activity  . Alcohol use: No  . Drug use: No  . Sexual activity: Not on file

## 2019-01-05 ENCOUNTER — Encounter: Payer: Self-pay | Admitting: Orthopedic Surgery

## 2019-01-05 ENCOUNTER — Ambulatory Visit (INDEPENDENT_AMBULATORY_CARE_PROVIDER_SITE_OTHER): Payer: Medicaid Other | Admitting: Orthopedic Surgery

## 2019-01-05 VITALS — Ht 72.0 in | Wt 245.0 lb

## 2019-01-05 DIAGNOSIS — S78111A Complete traumatic amputation at level between right hip and knee, initial encounter: Secondary | ICD-10-CM

## 2019-01-05 DIAGNOSIS — Z89611 Acquired absence of right leg above knee: Secondary | ICD-10-CM

## 2019-01-11 ENCOUNTER — Encounter: Payer: Self-pay | Admitting: Orthopedic Surgery

## 2019-01-11 NOTE — Progress Notes (Signed)
Office Visit Note   Patient: Gregory [name]           Date of Birth: 09/23/1960           MRN: [id number] Visit Date: 01/05/2019              Requested by: Gregory Marble, MD [place],  [name] [id number] PCP: Gregory Marble, MD  Chief Complaint  Patient presents with  . Right Leg - Routine Post Op    12/05/18 Revision Right AKA      HPI: Patient is a 58 year old gentleman who presents in follow-up approximately 4 weeks status post revision right above-the-knee amputation.  He is currently on doxycycline he states he has increased swelling and drainage.  Assessment & Plan: Visit Diagnoses:  1. Unilateral AKA, right (HCC)     Plan: Sutures are harvested continue with compression stocking.  Orders were placed for patient to have cleansing of the wound daily with soap and water to remove fibrinous exudate with compression after cleansing.  Follow-Up Instructions: Return in about 2 weeks (around 01/19/2019).   Ortho Exam  Patient is alert, oriented, no adenopathy, well-dressed, normal affect, normal respiratory effort. Examination the wound edges have some fibrinous exudative tissue this was debrided back to healthy viable granulation tissue no abscess no tunneling no signs of any deep infection.  The sutures are harvested.  Imaging: No results found. No images are attached to the encounter.  Labs: Lab Results  Component Value Date   HGBA1C 7.7 (H) 11/17/2018   HGBA1C 8.0 (H) 06/04/2018   HGBA1C 7.9 (H) 04/25/2018   ESRSEDRATE 120 (H) 03/25/2017   ESRSEDRATE 84 (H) 10/02/2016   ESRSEDRATE 132 (H) 12/01/2015   CRP 4.9 (H) 03/25/2017   CRP 30.6 (H) 10/02/2016   CRP 13.7 (H) 11/11/2015   REPTSTATUS 11/17/2018 FINAL 11/16/2018   GRAMSTAIN  06/06/2018    RARE WBC PRESENT, PREDOMINANTLY PMN FEW GRAM POSITIVE COCCI    CULT  11/16/2018    NO GROWTH Performed at [facility] [address] [place], [name] [address]    LABORGA PROVIDENCIA  Gregory (A) 01/12/2017     Lab Results  Component Value Date   ALBUMIN 3.3 (L) 10/22/2018   ALBUMIN 2.2 (L) 06/04/2018   ALBUMIN 2.8 (L) 06/03/2018   PREALBUMIN 23.3 03/25/2017    Lab Results  Component Value Date   MG 1.6 (L) 12/02/2015   MG 2.0 11/22/2015   MG 2.1 11/21/2015   No results found for: VD25OH  Lab Results  Component Value Date   PREALBUMIN 23.3 03/25/2017   CBC EXTENDED Latest Ref Rng & Units 12/06/2018 12/05/2018 11/21/2018  WBC 4.0 - 10.5 K/uL - 11.0(H) 6.5  RBC 4.22 - 5.81 MIL/uL - 3.11(L) 3.14(L)  HGB 13.0 - 17.0 g/dL 9.6(L) 7.7(L) 7.8(L)  HCT 39.0 - 52.0 % 31.3(L) 26.5(L) 26.4(L)  PLT 150 - 400 K/uL - 242 197  NEUTROABS 1.7 - 7.7 K/uL - - -  LYMPHSABS 0.7 - 4.0 K/uL - - -     Body mass index is 33.23 kg/m.  Orders:  No orders of the defined types were placed in this encounter.  No orders of the defined types were placed in this encounter.    Procedures: No procedures performed  Clinical Data: No additional findings.  ROS:  All other systems negative, except as noted in the HPI. Review of Systems  Objective: Vital Signs: Ht 6' (1.829 m)   Wt 245 lb (111.1  kg)   BMI 33.23 kg/m   Specialty Comments:  No specialty comments available.  PMFS History: Patient Active Problem List   Diagnosis Date Noted  . Acquired absence of right lower extremity above knee (Denmark) 12/05/2018  . Amputation stump infection (Onaga)   . Lymphedema of left leg   . Diabetic polyneuropathy associated with type 2 diabetes mellitus ([place] Mill)   . Wound infection 11/16/2018  . Below-knee amputation of right lower extremity (Briar) 10/22/2018  . Dehiscence of amputation stump (Round Rock)   . 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Hayesville) 08/12/2018  . Candidiasis 08/12/2018  . Necrosis of toe (Istachatta) 07/08/2018  . Amputated toe of left foot (Mather) 06/18/2018  . Streptococcal infection   . Pressure injury of skin 06/06/2018  . Cutaneous abscess of left foot   . Moderate  protein-calorie malnutrition (Easthampton)   . Cellulitis of left leg   . Septic arthritis (Hamlin) 06/03/2018  . Idiopathic chronic venous hypertension of left lower extremity with ulcer and inflammation (Norman) 11/14/2017  . Great toe amputation status, left 11/14/2017  . Acquired absence of right leg below knee (Fall Branch) 06/20/2017  . Osteomyelitis (Clearbrook Park) 03/25/2017  . Skin cancer 03/25/2017  . Depression, recurrent (Buckley) 11/05/2016  . Diabetic ulcer of toe of left foot (Harbor Bluffs) 10/02/2016  . 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 Chronic pain 09/27/2016  . Dyslipidemia associated with type 2 diabetes mellitus (Marietta-Alderwood) 06/06/2016  . GERD without esophagitis 05/11/2016  . Hypertensive heart disease with congestive heart failure (Williamstown) 12/15/2015  . Spinal stenosis in cervical region 12/15/2015  . Type II diabetes mellitus with neurological manifestations (Tallmadge) 12/15/2015  . Chronic diastolic CHF (congestive heart failure) (Port Townsend)   . Pulmonary hypertension (Nephi)   . Urinary retention   . Diskitis   . Foot drop, bilateral   . Sepsis with acute renal failure without septic shock (Woodbine)   . MRSA bacteremia 10/03/2015  . COPD (chronic obstructive pulmonary disease) (Pembroke) 10/03/2015  . Acute osteomyelitis of left foot (South Shaftsbury) 10/03/2015   Past Medical History:  Diagnosis Date  . Acquired absence of left great toe (Cherokee)   . Acquired absence of right leg below knee (Simonton)   . Acute osteomyelitis of calcaneum, right ([place] Beach Haven) 10/02/2016  . Acute respiratory failure (Velda Village Hills)   . Amputation stump infection (De Pue) 08/12/2018  . Anemia   . Basal cell carcinoma, eyelid   . Cancer (Spartansburg)   . Candidiasis 08/12/2018  . CHF (congestive heart failure) (HCC)    chronic diastolic   . Chronic back pain   . Chronic kidney disease    stage 3   . Chronic multifocal osteomyelitis (HCC)    of ankle and foot   . Chronic pain syndrome   . COPD (chronic obstructive pulmonary disease) (Caro)   . DDD (degenerative disc disease), lumbar   .  Depression    major depressive disorder   . Diabetes mellitus without complication (Wells)    type 2   . Diabetic ulcer of toe of left foot (Topsail Beach) 10/02/2016  . Difficulty in walking, not elsewhere classified   . 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 Foot drop, bilateral   . Foot drop, right 12/27/2015  . GERD (gastroesophageal reflux disease)   . Herpesviral vesicular dermatitis   . Hyperlipidemia   . Hyperlipidemia   . Hypertension   . Insomnia   . Malignant melanoma of other parts of face (Fairmount)   . MRSA bacteremia   . Muscle weakness (generalized)   . Nasal congestion   .  Necrosis of toe (Columbia) 07/08/2018  . Neuromuscular dysfunction of bladder   . Osteomyelitis (Vernonia)   . Osteomyelitis (Martinez Lake)   . Osteomyelitis (Chester Hill)    right BKA  . Other idiopathic peripheral autonomic neuropathy   . Retention of urine, unspecified   . Squamous cell carcinoma of skin   . Unsteadiness on feet   . Urinary retention   . Urinary retention   . Urinary tract infection   . Wears dentures   . Wears glasses     Family History  Problem Relation Age of Onset  . Cancer Other   . Diabetes Other     Past Surgical History:  Procedure Laterality Date  . AMPUTATION Left 03/29/2017   Procedure: LEFT GREAT TOE AMPUTATION AT METATARSOPHALANGEAL JOINT;  Surgeon: [name], MD;  Location: [facility];  Service: Orthopedics;  Laterality: Left;  . AMPUTATION Right 03/29/2017   Procedure: RIGHT BELOW KNEE AMPUTATION;  Surgeon: [name], MD;  Location: [facility];  Service: Orthopedics;  Laterality: Right;  . AMPUTATION Left 06/06/2018   Procedure: LEFT 2ND TOE AMPUTATION;  Surgeon: [name], MD;  Location: [facility];  Service: Orthopedics;  Laterality: Left;  . AMPUTATION Right 11/19/2018   Procedure: AMPUTATION ABOVE KNEE;  Surgeon: [name], MD;  Location: [facility];  Service: Orthopedics;  Laterality: Right;  . ANTERIOR CERVICAL CORPECTOMY N/A 11/25/2015   Procedure: ANTERIOR CERVICAL FIVE CORPECTOMY Cervical four -  six fusion;  Surgeon: Consuella Lose, MD;  Location: [facility];  Service: Neurosurgery;  Laterality: N/A;  ANTERIOR CERVICAL FIVE CORPECTOMY Cervical four - six fusion  . APPLICATION OF WOUND VAC Right 10/22/2018   Procedure: Application Of Wound Vac;  Surgeon: [name], MD;  Location: [facility];  Service: Orthopedics;  Laterality: Right;  . CYSTOSCOPY WITH RETROGRADE URETHROGRAM N/A 04/25/2018   Procedure: CYSTOSCOPY WITH RETROGRADE URETHROGRAM/ BALLOON DILATION;  Surgeon: Kathie Rhodes, MD;  Location: [facility];  Service: Urology;  Laterality: N/A;  . MULTIPLE TOOTH EXTRACTIONS    . POSTERIOR CERVICAL FUSION/FORAMINOTOMY N/A 11/29/2015   Procedure: Cervical Three-Cervical Seven Posterior Cervical Laminectomy with Fusion;  Surgeon: Consuella Lose, MD;  Location: [facility];  Service: Neurosurgery;  Laterality: N/A;  Cervical Three-Cervical Seven Posterior Cervical Laminectomy with Fusion  . STUMP REVISION Right 10/22/2018   Procedure: REVISION RIGHT BELOW KNEE AMPUTATION;  Surgeon: [name], MD;  Location: [facility];  Service: Orthopedics;  Laterality: Right;  . STUMP REVISION Right 12/05/2018   Procedure: Revision Right Above Knee Amputation;  Surgeon: [name], MD;  Location: [facility];  Service: Orthopedics;  Laterality: Right;   Social History   Occupational History  . Not on file  Tobacco Use  . Smoking status: Current Every Day Smoker    Packs/day: 0.50    Types: Cigarettes  . Smokeless tobacco: Never Used  . Tobacco comment: 11/19/18  Substance and Sexual Activity  . Alcohol use: No  . Drug use: No  . Sexual activity: Not on file

## 2019-01-19 ENCOUNTER — Ambulatory Visit: Payer: Medicaid Other | Admitting: Orthopedic Surgery

## 2019-01-20 ENCOUNTER — Telehealth: Payer: Self-pay

## 2019-01-20 ENCOUNTER — Ambulatory Visit: Payer: Medicaid Other | Admitting: Orthopedic Surgery

## 2019-01-20 ENCOUNTER — Telehealth: Payer: Self-pay | Admitting: Orthopedic Surgery

## 2019-01-20 ENCOUNTER — Other Ambulatory Visit: Payer: Self-pay | Admitting: Orthopedic Surgery

## 2019-01-20 MED ORDER — COLLAGENASE 250 UNIT/GM EX OINT: 1.0000 "application " | TOPICAL_OINTMENT | Freq: Every day | CUTANEOUS | 3 refills | Status: DC

## 2019-01-20 NOTE — Telephone Encounter (Signed)
April Tucker, from [facility] left a voicemail message stating that she needed to talk to you in regards to some orders on this patient.  CB#[phone].  Thank you.

## 2019-01-20 NOTE — Telephone Encounter (Signed)
Patient's wound care Nurse was called and discussed patient's care for dressing and wound. Nurse(Abra) is informed per Dr [name] to use santyl ointment on wound and when that is complete to call our office and schedule patient appt to be seen. She states that their facility is on lock-down at this time but not due to COVID-19 but per management protocol.  Dr [name] would like to see the patient in the office when available, nurse understood orders and stated she will call and set-up appt.

## 2019-02-19 ENCOUNTER — Telehealth: Payer: Self-pay | Admitting: Orthopedic Surgery

## 2019-02-19 NOTE — Telephone Encounter (Signed)
Sherlon Handing with [facility] called wanting to speak with you in regards to this patient and where they are at with him and how [name] wants them to proceed.  CB#[phone].  Than you.

## 2019-02-19 NOTE — Telephone Encounter (Signed)
Called and sw Sherlon Handing states that the right AKA is healing fine. The left foot hx of GT amputation. States that pt has opened this up and that he is causing sores on his left limb. Pt eill not stop manipulating the skin and needs appt.

## 2019-02-19 NOTE — Telephone Encounter (Signed)
Pt sch for 03/09/19 at 1:15

## 2019-03-09 ENCOUNTER — Encounter: Payer: Self-pay | Admitting: Orthopedic Surgery

## 2019-03-09 ENCOUNTER — Other Ambulatory Visit: Payer: Self-pay

## 2019-03-09 ENCOUNTER — Ambulatory Visit (INDEPENDENT_AMBULATORY_CARE_PROVIDER_SITE_OTHER): Payer: Medicaid Other | Admitting: Orthopedic Surgery

## 2019-03-09 VITALS — Ht 72.0 in | Wt 245.0 lb

## 2019-03-09 DIAGNOSIS — E1142 Type 2 diabetes mellitus with diabetic polyneuropathy: Secondary | ICD-10-CM

## 2019-03-09 DIAGNOSIS — I87332 Chronic venous hypertension (idiopathic) with ulcer and inflammation of left lower extremity: Secondary | ICD-10-CM | POA: Diagnosis not present

## 2019-03-09 DIAGNOSIS — T8781 Dehiscence of amputation stump: Secondary | ICD-10-CM | POA: Diagnosis not present

## 2019-03-09 DIAGNOSIS — Z89611 Acquired absence of right leg above knee: Secondary | ICD-10-CM

## 2019-03-09 DIAGNOSIS — Z794 Long term (current) use of insulin: Secondary | ICD-10-CM

## 2019-03-09 DIAGNOSIS — S78111A Complete traumatic amputation at level between right hip and knee, initial encounter: Secondary | ICD-10-CM

## 2019-03-09 DIAGNOSIS — L97929 Non-pressure chronic ulcer of unspecified part of left lower leg with unspecified severity: Secondary | ICD-10-CM

## 2019-03-09 NOTE — Progress Notes (Signed)
Office Visit Note   Patient: Gregory [name]           Date of Birth: 04-Jun-1961           MRN: [id number] Visit Date: 03/09/2019              Requested by: [name], MD [place],  [place] [address] PCP: [name], MD  Chief Complaint  Patient presents with  . Right Leg - Follow-up    Revision AKA  DOS- 12/05/2018      HPI: Patient is a 58 year old gentleman presents for massive venous stasis swelling left leg with an ulcer on the dorsal medial aspect the left foot with multiple ulcerations on the left leg as well as a wound dehiscence of the right above-the-knee amputation.  Patient states he is having wound clinic visits once a week.  Assessment & Plan: Visit Diagnoses:  1. Unilateral AKA, right (Overbrook)   2. Dehiscence of amputation stump (Stanhope)   3. Type 2 diabetes mellitus with diabetic polyneuropathy, with long-term current use of insulin (Houston)   4. Idiopathic chronic venous hypertension of left lower extremity with ulcer and inflammation (HCC)     Plan: Discussed with the patient importance of wearing compression stockings for the left lower extremity he is to elevate his leg and wear a compression stocking daily recommended dry dressing changes to the ulcer over the dorsal medial aspect of the left foot.  Recommended continuing dressing changes to the right above-the-knee amputation.  Patient is given a postoperative shoe for the left foot.  Follow-Up Instructions: Return in about 2 months (around 05/09/2019).   Ortho Exam  Patient is alert, oriented, no adenopathy, well-dressed, normal affect, normal respiratory effort. Examination patient has dermatitis over the right above-the-knee amputation the wound measures 2 x 3 cm and is 1 cm deep there is a small amount of fibrinous exudative tissue over the wound bed which has good granulation tissue there is no exposed bone or tendon.  Examination the left leg he has massive venous and lymphatic  insufficiency of the left lower extremity with ulcerations secondary to the swelling as well as an ulceration over the medial border of the left foot this measures 1 x 2 cm and is 1 mm deep.  This wound is flat there is no exposed bone or tendon.  Patient has no cellulitis in either lower extremity.  Imaging: No results found. No images are attached to the encounter.  Labs: Lab Results  Component Value Date   HGBA1C 7.7 (H) 11/17/2018   HGBA1C 8.0 (H) 06/04/2018   HGBA1C 7.9 (H) 04/25/2018   ESRSEDRATE 120 (H) 03/25/2017   ESRSEDRATE 84 (H) 10/02/2016   ESRSEDRATE 132 (H) 12/01/2015   CRP 4.9 (H) 03/25/2017   CRP 30.6 (H) 10/02/2016   CRP 13.7 (H) 11/11/2015   REPTSTATUS 11/17/2018 FINAL 11/16/2018   GRAMSTAIN  06/06/2018    RARE WBC PRESENT, PREDOMINANTLY PMN FEW GRAM POSITIVE COCCI    CULT  11/16/2018    NO GROWTH Performed at [facility] [address], [place], [place] [address]    LABORGA PROVIDENCIA [name] (A) 01/12/2017     Lab Results  Component Value Date   ALBUMIN 3.3 (L) 10/22/2018   ALBUMIN 2.2 (L) 06/04/2018   ALBUMIN 2.8 (L) 06/03/2018   PREALBUMIN 23.3 03/25/2017    Lab Results  Component Value Date   MG 1.6 (L) 12/02/2015   MG 2.0 11/22/2015   MG 2.1  11/21/2015   No results found for: VD25OH  Lab Results  Component Value Date   PREALBUMIN 23.3 03/25/2017   CBC EXTENDED Latest Ref Rng & Units 12/06/2018 12/05/2018 11/21/2018  WBC 4.0 - 10.5 K/uL - 11.0(H) 6.5  RBC 4.22 - 5.81 MIL/uL - 3.11(L) 3.14(L)  HGB 13.0 - 17.0 g/dL 9.6(L) 7.7(L) 7.8(L)  HCT 39.0 - 52.0 % 31.3(L) 26.5(L) 26.4(L)  PLT 150 - 400 K/uL - 242 197  NEUTROABS 1.7 - 7.7 K/uL - - -  LYMPHSABS 0.7 - 4.0 K/uL - - -     Body mass index is 33.23 kg/m.  Orders:  No orders of the defined types were placed in this encounter.  No orders of the defined types were placed in this encounter.    Procedures: No procedures performed  Clinical Data: No additional findings.   ROS:  All other systems negative, except as noted in the HPI. Review of Systems  Objective: Vital Signs: Ht 6' (1.829 m)   Wt 245 lb (111.1 kg)   BMI 33.23 kg/m   Specialty Comments:  No specialty comments available.  PMFS History: Patient Active Problem List   Diagnosis Date Noted  . Acquired absence of right lower extremity above knee (Estill) 12/05/2018  . Amputation stump infection (Northchase)   . Lymphedema of left leg   . Diabetic polyneuropathy associated with type 2 diabetes mellitus (Rocky Mountain)   . Wound infection 11/16/2018  . Below-knee amputation of right lower extremity (Michiana Shores) 10/22/2018  . Dehiscence of amputation stump (Friendly)   . 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Princeton) 08/12/2018  . Candidiasis 08/12/2018  . Necrosis of toe (Fairview Park) 07/08/2018  . Amputated toe of left foot (White Shield) 06/18/2018  . Streptococcal infection   . Pressure injury of skin 06/06/2018  . Cutaneous abscess of left foot   . Moderate protein-calorie malnutrition (Fort Dodge)   . Cellulitis of left leg   . Septic arthritis (El Mango) 06/03/2018  . Idiopathic chronic venous hypertension of left lower extremity with ulcer and inflammation (Kerr) 11/14/2017  . Great toe amputation status, left 11/14/2017  . Acquired absence of right leg below knee (Denmark) 06/20/2017  . Osteomyelitis (Latimer) 03/25/2017  . Skin cancer 03/25/2017  . Depression, recurrent (Richmond) 11/05/2016  . Diabetic ulcer of toe of left foot (Olivet) 10/02/2016  . 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 Chronic pain 09/27/2016  . Dyslipidemia associated with type 2 diabetes mellitus (Wilson) 06/06/2016  . GERD without esophagitis 05/11/2016  . Hypertensive heart disease with congestive heart failure (Edgewood) 12/15/2015  . Spinal stenosis in cervical region 12/15/2015  . Type II diabetes mellitus with neurological manifestations (St. Charles) 12/15/2015  . Chronic diastolic CHF (congestive heart failure) (Gravois Mills)   . Pulmonary hypertension (Kirkersville)   . Urinary retention   . Diskitis    . Foot drop, bilateral   . Sepsis with acute renal failure without septic shock (Lancaster)   . MRSA bacteremia 10/03/2015  . COPD (chronic obstructive pulmonary disease) (Scottsbluff) 10/03/2015  . Acute osteomyelitis of left foot (Miles City) 10/03/2015   Past Medical History:  Diagnosis Date  . Acquired absence of left great toe (Sycamore)   . Acquired absence of right leg below knee (Cameron)   . Acute osteomyelitis of calcaneum, right (Morgantown) 10/02/2016  . Acute respiratory failure (Los Ebanos)   . Amputation stump infection (Hastings) 08/12/2018  . Anemia   . Basal cell carcinoma, eyelid   . Cancer (Elmont)   . Candidiasis 08/12/2018  . CHF (congestive heart failure) (Dundee)  chronic diastolic   . Chronic back pain   . Chronic kidney disease    stage 3   . Chronic multifocal osteomyelitis (HCC)    of ankle and foot   . Chronic pain syndrome   . COPD (chronic obstructive pulmonary disease) (Port Dickinson)   . DDD (degenerative disc disease), lumbar   . Depression    major depressive disorder   . Diabetes mellitus without complication (Kenvir)    type 2   . Diabetic ulcer of toe of left foot (Stansbury Park) 10/02/2016  . Difficulty in walking, not elsewhere classified   . 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 Foot drop, bilateral   . Foot drop, right 12/27/2015  . GERD (gastroesophageal reflux disease)   . Herpesviral vesicular dermatitis   . Hyperlipidemia   . Hyperlipidemia   . Hypertension   . Insomnia   . Malignant melanoma of other parts of face (Enderlin)   . MRSA bacteremia   . Muscle weakness (generalized)   . Nasal congestion   . Necrosis of toe (Pierron) 07/08/2018  . Neuromuscular dysfunction of bladder   . Osteomyelitis (Mount Crawford)   . Osteomyelitis (Merrydale)   . Osteomyelitis (Wind Ridge)    right BKA  . Other idiopathic peripheral autonomic neuropathy   . Retention of urine, unspecified   . Squamous cell carcinoma of skin   . Unsteadiness on feet   . Urinary retention   . Urinary retention   . Urinary tract infection   . Wears dentures   . Wears  glasses     Family History  Problem Relation Age of Onset  . Cancer Other   . Diabetes Other     Past Surgical History:  Procedure Laterality Date  . AMPUTATION Left 03/29/2017   Procedure: LEFT GREAT TOE AMPUTATION AT METATARSOPHALANGEAL JOINT;  Surgeon: [name], MD;  Location: [facility];  Service: Orthopedics;  Laterality: Left;  . AMPUTATION Right 03/29/2017   Procedure: RIGHT BELOW KNEE AMPUTATION;  Surgeon: [name], MD;  Location: [facility];  Service: Orthopedics;  Laterality: Right;  . AMPUTATION Left 06/06/2018   Procedure: LEFT 2ND TOE AMPUTATION;  Surgeon: [name], MD;  Location: [facility];  Service: Orthopedics;  Laterality: Left;  . AMPUTATION Right 11/19/2018   Procedure: AMPUTATION ABOVE KNEE;  Surgeon: [name], MD;  Location: [facility];  Service: Orthopedics;  Laterality: Right;  . ANTERIOR CERVICAL CORPECTOMY N/A 11/25/2015   Procedure: ANTERIOR CERVICAL FIVE CORPECTOMY Cervical four - six fusion;  Surgeon: Consuella Lose, MD;  Location: [facility];  Service: Neurosurgery;  Laterality: N/A;  ANTERIOR CERVICAL FIVE CORPECTOMY Cervical four - six fusion  . APPLICATION OF WOUND VAC Right 10/22/2018   Procedure: Application Of Wound Vac;  Surgeon: [name], MD;  Location: [facility];  Service: Orthopedics;  Laterality: Right;  . CYSTOSCOPY WITH RETROGRADE URETHROGRAM N/A 04/25/2018   Procedure: CYSTOSCOPY WITH RETROGRADE URETHROGRAM/ BALLOON DILATION;  Surgeon: Kathie Rhodes, MD;  [facility];  Service: Urology;  Laterality: N/A;  . MULTIPLE TOOTH EXTRACTIONS    . POSTERIOR CERVICAL FUSION/FORAMINOTOMY N/A 11/29/2015   Procedure: Cervical Three-Cervical Seven Posterior Cervical Laminectomy with Fusion;  Surgeon: Consuella Lose, MD;  Location: [facility];  Service: Neurosurgery;  Laterality: N/A;  Cervical Three-Cervical Seven Posterior Cervical Laminectomy with Fusion  . STUMP REVISION Right 10/22/2018   Procedure: REVISION RIGHT BELOW KNEE AMPUTATION;   Surgeon: [name], MD;  Location: [facility];  Service: Orthopedics;  Laterality: Right;  . STUMP REVISION  Right 12/05/2018   Procedure: Revision Right Above Knee Amputation;  Surgeon: [name], MD;  Location: [facility];  Service: Orthopedics;  Laterality: Right;   Social History   Occupational History  . Not on file  Tobacco Use  . Smoking status: Current Every Day Smoker    Packs/day: 0.50    Types: Cigarettes  . Smokeless tobacco: Never Used  . Tobacco comment: 11/19/18  Substance and Sexual Activity  . Alcohol use: No  . Drug use: No  . Sexual activity: Not on file

## 2019-05-04 ENCOUNTER — Ambulatory Visit (INDEPENDENT_AMBULATORY_CARE_PROVIDER_SITE_OTHER): Payer: Medicaid Other | Admitting: Orthopedic Surgery

## 2019-05-04 ENCOUNTER — Encounter: Payer: Self-pay | Admitting: Orthopedic Surgery

## 2019-05-04 DIAGNOSIS — I87332 Chronic venous hypertension (idiopathic) with ulcer and inflammation of left lower extremity: Secondary | ICD-10-CM | POA: Diagnosis not present

## 2019-05-04 DIAGNOSIS — Z89611 Acquired absence of right leg above knee: Secondary | ICD-10-CM | POA: Diagnosis not present

## 2019-05-04 DIAGNOSIS — L97929 Non-pressure chronic ulcer of unspecified part of left lower leg with unspecified severity: Secondary | ICD-10-CM

## 2019-05-04 DIAGNOSIS — S78111A Complete traumatic amputation at level between right hip and knee, initial encounter: Secondary | ICD-10-CM

## 2019-05-04 DIAGNOSIS — T8781 Dehiscence of amputation stump: Secondary | ICD-10-CM | POA: Diagnosis not present

## 2019-05-04 NOTE — Progress Notes (Signed)
Office Visit Note   Patient: Gregory [name]           Date of Birth: [birth year]           MRN: [id number] Visit Date: 05/04/2019              Requested by: [name], MD [place],  [place] [address] PCP: [name], MD  Chief Complaint  Patient presents with  . Right Knee - Pain, Follow-up  . Left Foot - Pain      HPI: Patient is a 58 year old gentleman who is 5 months status post a right above-the-knee amputation as well as massive swelling of the left lower extremity he has been wearing compression stockings patient has not been doing any therapy he ambulates in a wheelchair.  Patient has had a chronic ulcer on his right above-knee amputation residual limb.  Assessment & Plan: Visit Diagnoses:  1. Unilateral AKA, right ([place])   2. Dehiscence of amputation stump ([place])   3. 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  Patient will continue with the dressing changes for his wound on the right above-knee amputation Iodosorb was applied today.  Follow-Up Instructions: Return in about 4 weeks (around 06/01/2019).   Ortho Exam  Patient is alert, oriented, no adenopathy, well-dressed, normal affect, normal respiratory effort. Examination patient has hard indurated swelling of the left lower extremity consistent with venous and lymphatic insufficiency.  Patient has skin dermatitis and thickening secondary to the swelling but no open draining ulcers no cellulitis.  He has swelling involving the foot and calf.  Examination the right above-knee amputation he has 50% granulation tissue 50% thin exudative tissue for wound that is 2 cm in diameter 3 mm deep this  continues to show improved healing there is no cellulitis no drainage no signs of infection.  Imaging: No results found. No images are attached to the encounter.  Labs: Lab Results  Component Value Date   HGBA1C 7.7 (H) 11/17/2018   HGBA1C 8.0 (H) 06/04/2018   HGBA1C 7.9 (H) 04/25/2018   ESRSEDRATE 120 (H) 03/25/2017   ESRSEDRATE 84 (H) 10/02/2016   ESRSEDRATE 132 (H) 12/01/2015   CRP 4.9 (H) 03/25/2017   CRP 30.6 (H) 10/02/2016   CRP 13.7 (H) 11/11/2015   REPTSTATUS 11/17/2018 FINAL 11/16/2018   GRAMSTAIN  06/06/2018    RARE WBC PRESENT, PREDOMINANTLY PMN FEW GRAM POSITIVE COCCI    CULT  11/16/2018    NO GROWTH Performed at [facility] [address] [place],  [address]    LABORGA PROVIDENCIA [name] (A) 01/12/2017     Lab Results  Component Value Date   ALBUMIN 3.3 (L) 10/22/2018   ALBUMIN 2.2 (L) 06/04/2018   ALBUMIN 2.8 (L) 06/03/2018   PREALBUMIN 23.3 03/25/2017    Lab Results  Component Value Date   MG 1.6 (L) 12/02/2015   MG 2.0 11/22/2015   MG 2.1 11/21/2015   No results found for: VD25OH  Lab Results  Component Value Date   PREALBUMIN 23.3 03/25/2017   CBC EXTENDED Latest Ref Rng & Units 12/06/2018 12/05/2018 11/21/2018  WBC 4.0 - 10.5 K/uL - 11.0(H) 6.5  RBC 4.22 - 5.81  MIL/uL - 3.11(L) 3.14(L)  HGB 13.0 - 17.0 g/dL 9.6(L) 7.7(L) 7.8(L)  HCT 39.0 - 52.0 % 31.3(L) 26.5(L) 26.4(L)  PLT 150 - 400 K/uL - 242 197  NEUTROABS 1.7 - 7.7 K/uL - - -  LYMPHSABS 0.7 - 4.0 K/uL - - -     There is no height or weight on file to calculate BMI.  Orders:  No orders of the defined types were placed in this encounter.  No orders of the defined types were placed in this encounter.    Procedures: No procedures performed  Clinical Data: No additional findings.  ROS:  All other systems negative, except as noted in the HPI. Review of Systems  Objective: Vital Signs: There were no vitals taken for this visit.  Specialty Comments:  No  specialty comments available.  PMFS History: Patient Active Problem List   Diagnosis Date Noted  . Acquired absence of right lower extremity above knee ([place]) 12/05/2018  . Amputation stump infection ([place])   . Lymphedema of left leg   . Diabetic polyneuropathy associated with type 2 diabetes mellitus ([place])   . Wound infection 11/16/2018  . Below-knee amputation of right lower extremity ([place]) 10/22/2018  . Dehiscence of amputation stump ([place])   . 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[place]) 08/12/2018  . Candidiasis 08/12/2018  . Necrosis of toe ([place]) 07/08/2018  . Amputated toe of left foot ([place]) 06/18/2018  . Streptococcal infection   . Pressure injury of skin 06/06/2018  . Cutaneous abscess of left foot   . Moderate protein-calorie malnutrition ([place])   . Cellulitis of left leg   . Septic arthritis ([place]) 06/03/2018  . 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 Great toe amputation status, left 11/14/2017  . Acquired absence of right leg below knee ([place]) 06/20/2017  . Osteomyelitis ([place]) 03/25/2017  . Skin cancer 03/25/2017  . Depression, recurrent ([place]) 11/05/2016  . Diabetic ulcer of toe of left foot ([place]) 10/02/2016  . 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 Chronic pain 09/27/2016  . Dyslipidemia associated with type 2 diabetes mellitus ([place]) 06/06/2016  . GERD without esophagitis 05/11/2016  . Hypertensive heart disease with congestive heart failure ([place]) 12/15/2015  . Spinal stenosis in cervical region 12/15/2015  . Type II diabetes mellitus with neurological manifestations ([place]) 12/15/2015  . Chronic diastolic CHF (congestive heart failure) ([place])   . Pulmonary hypertension ([place])   . Urinary retention   . Diskitis   . Foot drop, bilateral   . Sepsis with acute renal failure without septic shock ([place])   . MRSA bacteremia 10/03/2015  . COPD (chronic obstructive pulmonary disease) ([place]) 10/03/2015  . Acute  osteomyelitis of left foot ([place]) 10/03/2015   Past Medical History:  Diagnosis Date  . Acquired absence of left great toe ([place])   . Acquired absence of right leg below knee ([place])   . Acute osteomyelitis of calcaneum, right ([place]) 10/02/2016  . Acute respiratory failure ([place])   . Amputation stump infection ([place]) 08/12/2018  . Anemia   . Basal cell carcinoma, eyelid   . Cancer ([place])   . Candidiasis 08/12/2018  . CHF (congestive heart failure) (H[facility])    chronic diastolic   . Chronic back pain   . Chronic kidney disease    stage 3   . Chronic multifocal osteomyelitis ([facility])    of ankle and foot   . Chronic pain syndrome   . COPD (chronic obstructive pulmonary disease) ([place])   .  DDD (degenerative disc disease), lumbar   . Depression    major depressive disorder   . Diabetes mellitus without complication ([place])    type 2   . Diabetic ulcer of toe of left foot ([place]) 10/02/2016  . Difficulty in walking, not elsewhere classified   . 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 Foot drop, bilateral   . Foot drop, right 12/27/2015  . GERD (gastroesophageal reflux disease)   . Herpesviral vesicular dermatitis   . Hyperlipidemia   . Hyperlipidemia   . Hypertension   . Insomnia   . Malignant melanoma of other parts of face ([place])   . MRSA bacteremia   . Muscle weakness (generalized)   . Nasal congestion   . Necrosis of toe ([place]) 07/08/2018  . Neuromuscular dysfunction of bladder   . Osteomyelitis ([place])   . Osteomyelitis ([place])   . Osteomyelitis ([place])    right BKA  . Other idiopathic peripheral autonomic neuropathy   . Retention of urine, unspecified   . Squamous cell carcinoma of skin   . Unsteadiness on feet   . Urinary retention   . Urinary retention   . Urinary tract infection   . Wears dentures   . Wears glasses     Family History  Problem Relation Age of Onset  . Cancer Other   . Diabetes Other     Past Surgical History:  Procedure Laterality Date  . AMPUTATION Left 03/29/2017    Procedure: LEFT GREAT TOE AMPUTATION AT METATARSOPHALANGEAL JOINT;  Surgeon: [name], MD;  Location: [facility];  Service: Orthopedics;  Laterality: Left;  . AMPUTATION Right 03/29/2017   Procedure: RIGHT BELOW KNEE AMPUTATION;  Surgeon: [name], MD;  Location: [facility];  Service: Orthopedics;  Laterality: Right;  . AMPUTATION Left 06/06/2018   Procedure: LEFT 2ND TOE AMPUTATION;  Surgeon: [name], MD;  Location: [facility];  Service: Orthopedics;  Laterality: Left;  . AMPUTATION Right 11/19/2018   Procedure: AMPUTATION ABOVE KNEE;  Surgeon: [name], MD;  Location: [facility];  Service: Orthopedics;  Laterality: Right;  . ANTERIOR CERVICAL CORPECTOMY N/A 11/25/2015   Procedure: ANTERIOR CERVICAL FIVE CORPECTOMY Cervical four - six fusion;  Surgeon: Consuella Lose, MD;  Location: [facility];  Service: Neurosurgery;  Laterality: N/A;  ANTERIOR CERVICAL FIVE CORPECTOMY Cervical four - six fusion  . APPLICATION OF WOUND VAC Right 10/22/2018   Procedure: Application Of Wound Vac;  Surgeon: [name], MD;  Location: [facility];  Service: Orthopedics;  Laterality: Right;  . CYSTOSCOPY WITH RETROGRADE URETHROGRAM N/A 04/25/2018   Procedure: CYSTOSCOPY WITH RETROGRADE URETHROGRAM/ BALLOON DILATION;  Surgeon: Kathie Rhodes, MD;  Location: [facility];  Service: Urology;  Laterality: N/A;  . MULTIPLE TOOTH EXTRACTIONS    . POSTERIOR CERVICAL FUSION/FORAMINOTOMY N/A 11/29/2015   Procedure: Cervical Three-Cervical Seven Posterior Cervical Laminectomy with Fusion;  Surgeon: Consuella Lose, MD;  Location: [facility];  Service: Neurosurgery;  Laterality: N/A;  Cervical Three-Cervical Seven Posterior Cervical Laminectomy with Fusion  . STUMP REVISION Right 10/22/2018   Procedure: REVISION RIGHT BELOW KNEE AMPUTATION;  Surgeon: [name], MD;  Location: [facility];  Service: Orthopedics;  Laterality: Right;  . STUMP REVISION Right 12/05/2018   Procedure: Revision Right Above Knee Amputation;  Surgeon: [name], MD;  Location: [facility];  Service: Orthopedics;  Laterality: Right;   Social History   Occupational History  . Not on file  Tobacco Use  . Smoking status: Current Every Day Smoker  Packs/day: 0.50    Types: Cigarettes  . Smokeless tobacco: Never Used  . Tobacco comment: 11/19/18  Substance and Sexual Activity  . Alcohol use: No  . Drug use: No  . Sexual activity: Not on file

## 2019-05-22 ENCOUNTER — Inpatient Hospital Stay (HOSPITAL_COMMUNITY)
Admission: EM | Admit: 2019-05-22 | Discharge: 2019-05-31 | DRG: 474 | Disposition: A | Discharge status: Skilled Nursing Facility | Payer: Medicaid Other | Attending: Internal Medicine | Admitting: Internal Medicine

## 2019-05-22 ENCOUNTER — Encounter (HOSPITAL_COMMUNITY): Payer: Self-pay

## 2019-05-22 ENCOUNTER — Emergency Department (HOSPITAL_COMMUNITY): Payer: Medicaid Other

## 2019-05-22 ENCOUNTER — Other Ambulatory Visit: Payer: Self-pay

## 2019-05-22 DIAGNOSIS — T8740 Infection of amputation stump, unspecified extremity: Secondary | ICD-10-CM | POA: Diagnosis present

## 2019-05-22 DIAGNOSIS — T8743 Infection of amputation stump, right lower extremity: Principal | ICD-10-CM | POA: Diagnosis present

## 2019-05-22 DIAGNOSIS — F17210 Nicotine dependence, cigarettes, uncomplicated: Secondary | ICD-10-CM | POA: Diagnosis present

## 2019-05-22 DIAGNOSIS — Y835 Amputation of limb(s) as the cause of abnormal reaction of the patient, or of later complication, without mention of misadventure at the time of the procedure: Secondary | ICD-10-CM | POA: Diagnosis present

## 2019-05-22 DIAGNOSIS — I130 Hypertensive heart and chronic kidney disease with heart failure and stage 1 through stage 4 chronic kidney disease, or unspecified chronic kidney disease: Secondary | ICD-10-CM | POA: Diagnosis present

## 2019-05-22 DIAGNOSIS — D631 Anemia in chronic kidney disease: Secondary | ICD-10-CM | POA: Diagnosis present

## 2019-05-22 DIAGNOSIS — G894 Chronic pain syndrome: Secondary | ICD-10-CM | POA: Diagnosis present

## 2019-05-22 DIAGNOSIS — E11622 Type 2 diabetes mellitus with other skin ulcer: Secondary | ICD-10-CM | POA: Diagnosis present

## 2019-05-22 DIAGNOSIS — Z89611 Acquired absence of right leg above knee: Secondary | ICD-10-CM

## 2019-05-22 DIAGNOSIS — Z85820 Personal history of malignant melanoma of skin: Secondary | ICD-10-CM

## 2019-05-22 DIAGNOSIS — J449 Chronic obstructive pulmonary disease, unspecified: Secondary | ICD-10-CM | POA: Diagnosis present

## 2019-05-22 DIAGNOSIS — N1832 Chronic kidney disease, stage 3b: Secondary | ICD-10-CM

## 2019-05-22 DIAGNOSIS — I87309 Chronic venous hypertension (idiopathic) without complications of unspecified lower extremity: Secondary | ICD-10-CM | POA: Diagnosis present

## 2019-05-22 DIAGNOSIS — E1149 Type 2 diabetes mellitus with other diabetic neurological complication: Secondary | ICD-10-CM | POA: Diagnosis present

## 2019-05-22 DIAGNOSIS — Z23 Encounter for immunization: Secondary | ICD-10-CM

## 2019-05-22 DIAGNOSIS — A419 Sepsis, unspecified organism: Secondary | ICD-10-CM | POA: Diagnosis present

## 2019-05-22 DIAGNOSIS — E1165 Type 2 diabetes mellitus with hyperglycemia: Secondary | ICD-10-CM | POA: Diagnosis present

## 2019-05-22 DIAGNOSIS — I5032 Chronic diastolic (congestive) heart failure: Secondary | ICD-10-CM | POA: Diagnosis present

## 2019-05-22 DIAGNOSIS — Z87440 Personal history of urinary (tract) infections: Secondary | ICD-10-CM

## 2019-05-22 DIAGNOSIS — K219 Gastro-esophageal reflux disease without esophagitis: Secondary | ICD-10-CM | POA: Diagnosis present

## 2019-05-22 DIAGNOSIS — L089 Local infection of the skin and subcutaneous tissue, unspecified: Secondary | ICD-10-CM

## 2019-05-22 DIAGNOSIS — Z981 Arthrodesis status: Secondary | ICD-10-CM

## 2019-05-22 DIAGNOSIS — N183 Chronic kidney disease, stage 3 unspecified: Secondary | ICD-10-CM

## 2019-05-22 DIAGNOSIS — Z79899 Other long term (current) drug therapy: Secondary | ICD-10-CM

## 2019-05-22 DIAGNOSIS — E46 Unspecified protein-calorie malnutrition: Secondary | ICD-10-CM | POA: Diagnosis present

## 2019-05-22 DIAGNOSIS — Z91040 Latex allergy status: Secondary | ICD-10-CM

## 2019-05-22 DIAGNOSIS — T8789 Other complications of amputation stump: Secondary | ICD-10-CM | POA: Diagnosis present

## 2019-05-22 DIAGNOSIS — L97119 Non-pressure chronic ulcer of right thigh with unspecified severity: Secondary | ICD-10-CM | POA: Diagnosis present

## 2019-05-22 DIAGNOSIS — Z794 Long term (current) use of insulin: Secondary | ICD-10-CM

## 2019-05-22 DIAGNOSIS — Z8701 Personal history of pneumonia (recurrent): Secondary | ICD-10-CM

## 2019-05-22 DIAGNOSIS — E785 Hyperlipidemia, unspecified: Secondary | ICD-10-CM | POA: Diagnosis present

## 2019-05-22 DIAGNOSIS — Z22322 Carrier or suspected carrier of Methicillin resistant Staphylococcus aureus: Secondary | ICD-10-CM

## 2019-05-22 DIAGNOSIS — Z833 Family history of diabetes mellitus: Secondary | ICD-10-CM

## 2019-05-22 DIAGNOSIS — E1122 Type 2 diabetes mellitus with diabetic chronic kidney disease: Secondary | ICD-10-CM | POA: Diagnosis present

## 2019-05-22 DIAGNOSIS — Z720 Tobacco use: Secondary | ICD-10-CM

## 2019-05-22 DIAGNOSIS — E1169 Type 2 diabetes mellitus with other specified complication: Secondary | ICD-10-CM | POA: Diagnosis present

## 2019-05-22 DIAGNOSIS — Z20828 Contact with and (suspected) exposure to other viral communicable diseases: Secondary | ICD-10-CM | POA: Diagnosis present

## 2019-05-22 DIAGNOSIS — F329 Major depressive disorder, single episode, unspecified: Secondary | ICD-10-CM | POA: Diagnosis present

## 2019-05-22 DIAGNOSIS — M869 Osteomyelitis, unspecified: Secondary | ICD-10-CM | POA: Diagnosis present

## 2019-05-22 DIAGNOSIS — L0390 Cellulitis, unspecified: Secondary | ICD-10-CM

## 2019-05-22 DIAGNOSIS — Z972 Presence of dental prosthetic device (complete) (partial): Secondary | ICD-10-CM

## 2019-05-22 LAB — CBC WITH DIFFERENTIAL/PLATELET
Abs Immature Granulocytes: 0.16 10*3/uL — ABNORMAL HIGH (ref 0.00–0.07)
Basophils Absolute: 0.1 10*3/uL (ref 0.0–0.1)
Basophils Relative: 0 %
Eosinophils Absolute: 0.1 10*3/uL (ref 0.0–0.5)
Eosinophils Relative: 0 %
HCT: 37.8 % — ABNORMAL LOW (ref 39.0–52.0)
Hemoglobin: 11.8 g/dL — ABNORMAL LOW (ref 13.0–17.0)
Immature Granulocytes: 1 %
Lymphocytes Relative: 2 %
Lymphs Abs: 0.4 10*3/uL — ABNORMAL LOW (ref 0.7–4.0)
MCH: 26 pg (ref 26.0–34.0)
MCHC: 31.2 g/dL (ref 30.0–36.0)
MCV: 83.4 fL (ref 80.0–100.0)
Monocytes Absolute: 0.6 10*3/uL (ref 0.1–1.0)
Monocytes Relative: 3 %
Neutro Abs: 20.6 10*3/uL — ABNORMAL HIGH (ref 1.7–7.7)
Neutrophils Relative %: 94 %
Platelets: 256 10*3/uL (ref 150–400)
RBC: 4.53 MIL/uL (ref 4.22–5.81)
RDW: 17.4 % — ABNORMAL HIGH (ref 11.5–15.5)
WBC: 21.9 10*3/uL — ABNORMAL HIGH (ref 4.0–10.5)
nRBC: 0 % (ref 0.0–0.2)

## 2019-05-22 LAB — COMPREHENSIVE METABOLIC PANEL
ALT: 12 U/L (ref 0–44)
AST: 13 U/L — ABNORMAL LOW (ref 15–41)
Albumin: 3.3 g/dL — ABNORMAL LOW (ref 3.5–5.0)
Alkaline Phosphatase: 90 U/L (ref 38–126)
Anion gap: 12 (ref 5–15)
BUN: 21 mg/dL — ABNORMAL HIGH (ref 6–20)
CO2: 23 mmol/L (ref 22–32)
Calcium: 9 mg/dL (ref 8.9–10.3)
Chloride: 100 mmol/L (ref 98–111)
Creatinine, Ser: 1.79 mg/dL — ABNORMAL HIGH (ref 0.61–1.24)
GFR calc Af Amer: 47 mL/min — ABNORMAL LOW (ref >60)
GFR calc non Af Amer: 41 mL/min — ABNORMAL LOW (ref >60)
Glucose, Bld: 209 mg/dL — ABNORMAL HIGH (ref 70–99)
Potassium: 4.4 mmol/L (ref 3.5–5.1)
Sodium: 135 mmol/L (ref 135–145)
Total Bilirubin: 0.5 mg/dL (ref 0.3–1.2)
Total Protein: 7.4 g/dL (ref 6.5–8.1)

## 2019-05-22 LAB — URINALYSIS, ROUTINE W REFLEX MICROSCOPIC
Bacteria, UA: NONE SEEN
Bilirubin Urine: NEGATIVE
Glucose, UA: NEGATIVE mg/dL
Hgb urine dipstick: NEGATIVE
Ketones, ur: NEGATIVE mg/dL
Leukocytes,Ua: NEGATIVE
Nitrite: NEGATIVE
Protein, ur: 100 mg/dL — AB
Specific Gravity, Urine: 1.018 (ref 1.005–1.030)
pH: 5 (ref 5.0–8.0)

## 2019-05-22 LAB — LACTIC ACID, PLASMA: Lactic Acid, Venous: 2.1 mmol/L (ref 0.5–1.9)

## 2019-05-22 MED ORDER — OXYCODONE HCL 5 MG PO TABS
5.0000 mg | ORAL_TABLET | Freq: Once | ORAL | Status: AC
  Administered 2019-05-22: 5 mg via ORAL
  Filled 2019-05-22: qty 1

## 2019-05-22 MED ORDER — ACETAMINOPHEN 325 MG PO TABS
650.0000 mg | ORAL_TABLET | Freq: Once | ORAL | Status: AC
  Administered 2019-05-22: 22:00:00 650 mg via ORAL
  Filled 2019-05-22: qty 2

## 2019-05-22 NOTE — ED Triage Notes (Signed)
Pt BIB [facility] for eval of R leg pain at the site of his R AKA. EMS reports some minor erythema w/ oozing. Pt reports shortness of breath, satting well on RA. Pt arrives in triage attempting to unbuckle EMS seatbelts and agitated, making demands. GCS 15, A&Ox4.

## 2019-05-22 NOTE — ED Notes (Signed)
Pt c/o increased pain and is moaning. This tech rechecked VS.

## 2019-05-23 ENCOUNTER — Inpatient Hospital Stay (HOSPITAL_COMMUNITY): Payer: Medicaid Other

## 2019-05-23 DIAGNOSIS — Z89511 Acquired absence of right leg below knee: Secondary | ICD-10-CM | POA: Diagnosis not present

## 2019-05-23 DIAGNOSIS — E118 Type 2 diabetes mellitus with unspecified complications: Secondary | ICD-10-CM | POA: Diagnosis not present

## 2019-05-23 DIAGNOSIS — N1832 Chronic kidney disease, stage 3b: Secondary | ICD-10-CM

## 2019-05-23 DIAGNOSIS — A419 Sepsis, unspecified organism: Secondary | ICD-10-CM | POA: Diagnosis present

## 2019-05-23 DIAGNOSIS — Z91040 Latex allergy status: Secondary | ICD-10-CM | POA: Diagnosis not present

## 2019-05-23 DIAGNOSIS — E1149 Type 2 diabetes mellitus with other diabetic neurological complication: Secondary | ICD-10-CM | POA: Diagnosis present

## 2019-05-23 DIAGNOSIS — E785 Hyperlipidemia, unspecified: Secondary | ICD-10-CM | POA: Diagnosis present

## 2019-05-23 DIAGNOSIS — N183 Chronic kidney disease, stage 3 unspecified: Secondary | ICD-10-CM

## 2019-05-23 DIAGNOSIS — T8789 Other complications of amputation stump: Secondary | ICD-10-CM | POA: Diagnosis present

## 2019-05-23 DIAGNOSIS — E440 Moderate protein-calorie malnutrition: Secondary | ICD-10-CM | POA: Diagnosis not present

## 2019-05-23 DIAGNOSIS — T8740 Infection of amputation stump, unspecified extremity: Secondary | ICD-10-CM | POA: Diagnosis not present

## 2019-05-23 DIAGNOSIS — E46 Unspecified protein-calorie malnutrition: Secondary | ICD-10-CM | POA: Diagnosis present

## 2019-05-23 DIAGNOSIS — E1122 Type 2 diabetes mellitus with diabetic chronic kidney disease: Secondary | ICD-10-CM | POA: Diagnosis present

## 2019-05-23 DIAGNOSIS — Z20828 Contact with and (suspected) exposure to other viral communicable diseases: Secondary | ICD-10-CM | POA: Diagnosis present

## 2019-05-23 DIAGNOSIS — M869 Osteomyelitis, unspecified: Secondary | ICD-10-CM | POA: Diagnosis present

## 2019-05-23 DIAGNOSIS — K219 Gastro-esophageal reflux disease without esophagitis: Secondary | ICD-10-CM | POA: Diagnosis present

## 2019-05-23 DIAGNOSIS — F329 Major depressive disorder, single episode, unspecified: Secondary | ICD-10-CM | POA: Diagnosis present

## 2019-05-23 DIAGNOSIS — E1165 Type 2 diabetes mellitus with hyperglycemia: Secondary | ICD-10-CM | POA: Diagnosis present

## 2019-05-23 DIAGNOSIS — I130 Hypertensive heart and chronic kidney disease with heart failure and stage 1 through stage 4 chronic kidney disease, or unspecified chronic kidney disease: Secondary | ICD-10-CM | POA: Diagnosis present

## 2019-05-23 DIAGNOSIS — F17210 Nicotine dependence, cigarettes, uncomplicated: Secondary | ICD-10-CM | POA: Diagnosis present

## 2019-05-23 DIAGNOSIS — L089 Local infection of the skin and subcutaneous tissue, unspecified: Secondary | ICD-10-CM | POA: Diagnosis not present

## 2019-05-23 DIAGNOSIS — I87309 Chronic venous hypertension (idiopathic) without complications of unspecified lower extremity: Secondary | ICD-10-CM | POA: Diagnosis present

## 2019-05-23 DIAGNOSIS — E1169 Type 2 diabetes mellitus with other specified complication: Secondary | ICD-10-CM | POA: Diagnosis present

## 2019-05-23 DIAGNOSIS — T148XXA Other injury of unspecified body region, initial encounter: Secondary | ICD-10-CM | POA: Diagnosis present

## 2019-05-23 DIAGNOSIS — L97119 Non-pressure chronic ulcer of right thigh with unspecified severity: Secondary | ICD-10-CM | POA: Diagnosis present

## 2019-05-23 DIAGNOSIS — T8781 Dehiscence of amputation stump: Secondary | ICD-10-CM | POA: Diagnosis not present

## 2019-05-23 DIAGNOSIS — L0390 Cellulitis, unspecified: Secondary | ICD-10-CM

## 2019-05-23 DIAGNOSIS — D631 Anemia in chronic kidney disease: Secondary | ICD-10-CM | POA: Diagnosis present

## 2019-05-23 DIAGNOSIS — M86451 Chronic osteomyelitis with draining sinus, right femur: Secondary | ICD-10-CM | POA: Diagnosis not present

## 2019-05-23 DIAGNOSIS — E11622 Type 2 diabetes mellitus with other skin ulcer: Secondary | ICD-10-CM | POA: Diagnosis present

## 2019-05-23 DIAGNOSIS — T8743 Infection of amputation stump, right lower extremity: Secondary | ICD-10-CM | POA: Diagnosis present

## 2019-05-23 DIAGNOSIS — Z720 Tobacco use: Secondary | ICD-10-CM

## 2019-05-23 DIAGNOSIS — J449 Chronic obstructive pulmonary disease, unspecified: Secondary | ICD-10-CM | POA: Diagnosis present

## 2019-05-23 DIAGNOSIS — Z22322 Carrier or suspected carrier of Methicillin resistant Staphylococcus aureus: Secondary | ICD-10-CM | POA: Diagnosis not present

## 2019-05-23 DIAGNOSIS — Z23 Encounter for immunization: Secondary | ICD-10-CM | POA: Diagnosis not present

## 2019-05-23 DIAGNOSIS — Y835 Amputation of limb(s) as the cause of abnormal reaction of the patient, or of later complication, without mention of misadventure at the time of the procedure: Secondary | ICD-10-CM | POA: Diagnosis present

## 2019-05-23 DIAGNOSIS — I5032 Chronic diastolic (congestive) heart failure: Secondary | ICD-10-CM | POA: Diagnosis present

## 2019-05-23 LAB — HEMOGLOBIN A1C
Hgb A1c MFr Bld: 8.6 % — ABNORMAL HIGH (ref 4.8–5.6)
Mean Plasma Glucose: 200.12 mg/dL

## 2019-05-23 LAB — LACTIC ACID, PLASMA: Lactic Acid, Venous: 1.9 mmol/L (ref 0.5–1.9)

## 2019-05-23 LAB — GLUCOSE, CAPILLARY
Glucose-Capillary: 188 mg/dL — ABNORMAL HIGH (ref 70–99)
Glucose-Capillary: 175 mg/dL — ABNORMAL HIGH (ref 70–99)

## 2019-05-23 LAB — SARS CORONAVIRUS 2 (TAT 6-24 HRS): SARS Coronavirus 2: NEGATIVE

## 2019-05-23 LAB — CBG MONITORING, ED
Glucose-Capillary: 152 mg/dL — ABNORMAL HIGH (ref 70–99)
Glucose-Capillary: 179 mg/dL — ABNORMAL HIGH (ref 70–99)

## 2019-05-23 MED ORDER — UMECLIDINIUM BROMIDE 62.5 MCG/INH IN AEPB
1.0000 | INHALATION_SPRAY | Freq: Every day | RESPIRATORY_TRACT | Status: DC
  Administered 2019-05-23 – 2019-05-31 (×6): 1 via RESPIRATORY_TRACT
  Filled 2019-05-23 (×2): qty 7

## 2019-05-23 MED ORDER — HYDROMORPHONE HCL 1 MG/ML IJ SOLN
1.0000 mg | Freq: Once | INTRAMUSCULAR | Status: AC
  Administered 2019-05-23: 1 mg via INTRAVENOUS
  Filled 2019-05-23: qty 1

## 2019-05-23 MED ORDER — CALCIUM CARBONATE ANTACID 500 MG PO CHEW
1.0000 | CHEWABLE_TABLET | Freq: Three times a day (TID) | ORAL | Status: DC | PRN
  Administered 2019-05-23 – 2019-05-25 (×2): 200 mg via ORAL
  Filled 2019-05-23 (×2): qty 1

## 2019-05-23 MED ORDER — FUROSEMIDE 40 MG PO TABS
40.0000 mg | ORAL_TABLET | Freq: Every day | ORAL | Status: DC
  Administered 2019-05-23 – 2019-05-25 (×3): 40 mg via ORAL
  Filled 2019-05-23: qty 1
  Filled 2019-05-23: qty 2
  Filled 2019-05-23: qty 1

## 2019-05-23 MED ORDER — INSULIN ASPART 100 UNIT/ML ~~LOC~~ SOLN
0.0000 [IU] | Freq: Three times a day (TID) | SUBCUTANEOUS | Status: DC
  Administered 2019-05-23 – 2019-05-24 (×4): 2 [IU] via SUBCUTANEOUS
  Administered 2019-05-24: 1 [IU] via SUBCUTANEOUS
  Administered 2019-05-24 – 2019-05-25 (×2): 3 [IU] via SUBCUTANEOUS
  Administered 2019-05-25: 2 [IU] via SUBCUTANEOUS
  Administered 2019-05-25 – 2019-05-26 (×2): 3 [IU] via SUBCUTANEOUS
  Administered 2019-05-26 (×2): 2 [IU] via SUBCUTANEOUS
  Administered 2019-05-27: 3 [IU] via SUBCUTANEOUS
  Administered 2019-05-27: 2 [IU] via SUBCUTANEOUS
  Administered 2019-05-28: 5 [IU] via SUBCUTANEOUS
  Administered 2019-05-28 – 2019-05-29 (×2): 3 [IU] via SUBCUTANEOUS
  Administered 2019-05-29: 5 [IU] via SUBCUTANEOUS
  Administered 2019-05-29 – 2019-05-30 (×4): 2 [IU] via SUBCUTANEOUS
  Administered 2019-05-31: 3 [IU] via SUBCUTANEOUS
  Administered 2019-05-31: 5 [IU] via SUBCUTANEOUS

## 2019-05-23 MED ORDER — ZOLPIDEM TARTRATE 5 MG PO TABS
5.0000 mg | ORAL_TABLET | Freq: Every evening | ORAL | Status: DC | PRN
  Administered 2019-05-24 – 2019-05-31 (×8): 5 mg via ORAL
  Filled 2019-05-23 (×10): qty 1

## 2019-05-23 MED ORDER — NICOTINE 14 MG/24HR TD PT24: 14.0000 mg | MEDICATED_PATCH | Freq: Every day | TRANSDERMAL | Status: DC

## 2019-05-23 MED ORDER — OXYBUTYNIN CHLORIDE ER 15 MG PO TB24
15.0000 mg | ORAL_TABLET | Freq: Every day | ORAL | Status: DC
  Administered 2019-05-23 – 2019-05-30 (×8): 15 mg via ORAL
  Filled 2019-05-23 (×9): qty 1

## 2019-05-23 MED ORDER — POLYETHYLENE GLYCOL 3350 17 G PO PACK: 17.0000 g | PACK | Freq: Every day | ORAL | Status: DC | PRN

## 2019-05-23 MED ORDER — ALBUTEROL SULFATE (2.5 MG/3ML) 0.083% IN NEBU: 3.0000 mL | INHALATION_SOLUTION | Freq: Four times a day (QID) | RESPIRATORY_TRACT | Status: DC | PRN

## 2019-05-23 MED ORDER — VANCOMYCIN HCL IN DEXTROSE 1-5 GM/200ML-% IV SOLN: 1000.0000 mg | Freq: Once | INTRAVENOUS | Status: DC

## 2019-05-23 MED ORDER — VITAMIN B-12 1000 MCG PO TABS
1000.0000 ug | ORAL_TABLET | Freq: Every day | ORAL | Status: DC
  Administered 2019-05-23 – 2019-05-31 (×9): 1000 ug via ORAL
  Filled 2019-05-23 (×9): qty 1

## 2019-05-23 MED ORDER — ACETAMINOPHEN 325 MG PO TABS
650.0000 mg | ORAL_TABLET | Freq: Four times a day (QID) | ORAL | Status: DC | PRN
  Administered 2019-05-23 (×2): 650 mg via ORAL
  Filled 2019-05-23 (×2): qty 2

## 2019-05-23 MED ORDER — PAROXETINE HCL 10 MG PO TABS
10.0000 mg | ORAL_TABLET | Freq: Every day | ORAL | Status: DC
  Administered 2019-05-23 – 2019-05-30 (×8): 10 mg via ORAL
  Filled 2019-05-23 (×9): qty 1

## 2019-05-23 MED ORDER — MORPHINE SULFATE (PF) 2 MG/ML IV SOLN
2.0000 mg | INTRAVENOUS | Status: DC | PRN
  Administered 2019-05-23 – 2019-05-29 (×19): 2 mg via INTRAVENOUS
  Filled 2019-05-23 (×21): qty 1

## 2019-05-23 MED ORDER — NICOTINE 14 MG/24HR TD PT24
14.0000 mg | MEDICATED_PATCH | Freq: Every day | TRANSDERMAL | Status: DC
  Administered 2019-05-23 – 2019-05-31 (×9): 14 mg via TRANSDERMAL
  Filled 2019-05-23 (×9): qty 1

## 2019-05-23 MED ORDER — SALINE SPRAY 0.65 % NA SOLN
1.0000 | NASAL | Status: DC | PRN
  Administered 2019-05-23 – 2019-05-25 (×2): 1 via NASAL
  Filled 2019-05-23: qty 44

## 2019-05-23 MED ORDER — VANCOMYCIN HCL 10 G IV SOLR
1250.0000 mg | INTRAVENOUS | Status: DC
  Administered 2019-05-24 – 2019-05-25 (×2): 1250 mg via INTRAVENOUS
  Filled 2019-05-23 (×2): qty 1250

## 2019-05-23 MED ORDER — TIZANIDINE HCL 2 MG PO TABS
4.0000 mg | ORAL_TABLET | Freq: Three times a day (TID) | ORAL | Status: DC | PRN
  Administered 2019-05-23 – 2019-05-29 (×2): 4 mg via ORAL
  Filled 2019-05-23: qty 2
  Filled 2019-05-23: qty 1

## 2019-05-23 MED ORDER — SODIUM CHLORIDE 0.9 % IV SOLN
2.0000 g | Freq: Once | INTRAVENOUS | Status: AC
  Administered 2019-05-23: 01:00:00 2 g via INTRAVENOUS
  Filled 2019-05-23: qty 20

## 2019-05-23 MED ORDER — FINASTERIDE 5 MG PO TABS
5.0000 mg | ORAL_TABLET | Freq: Every day | ORAL | Status: DC
  Administered 2019-05-23 – 2019-05-31 (×9): 5 mg via ORAL
  Filled 2019-05-23 (×9): qty 1

## 2019-05-23 MED ORDER — FERROUS SULFATE 325 (65 FE) MG PO TABS
325.0000 mg | ORAL_TABLET | Freq: Every day | ORAL | Status: DC
  Administered 2019-05-24 – 2019-05-31 (×8): 325 mg via ORAL
  Filled 2019-05-23 (×8): qty 1

## 2019-05-23 MED ORDER — OXYCODONE HCL 5 MG PO TABS
5.0000 mg | ORAL_TABLET | Freq: Four times a day (QID) | ORAL | Status: DC | PRN
  Administered 2019-05-23: 5 mg via ORAL
  Filled 2019-05-23: qty 1

## 2019-05-23 MED ORDER — MORPHINE SULFATE (PF) 2 MG/ML IV SOLN
1.0000 mg | INTRAVENOUS | Status: DC | PRN
  Administered 2019-05-23 (×2): 1 mg via INTRAVENOUS
  Filled 2019-05-23 (×2): qty 1

## 2019-05-23 MED ORDER — OXYCODONE HCL 5 MG PO TABS
20.0000 mg | ORAL_TABLET | Freq: Four times a day (QID) | ORAL | Status: DC | PRN
  Administered 2019-05-23 (×2): 20 mg via ORAL
  Administered 2019-05-23: 15 mg via ORAL
  Administered 2019-05-24 – 2019-05-31 (×27): 20 mg via ORAL
  Filled 2019-05-23 (×30): qty 4

## 2019-05-23 MED ORDER — PANTOPRAZOLE SODIUM 40 MG PO TBEC
40.0000 mg | DELAYED_RELEASE_TABLET | Freq: Every day | ORAL | Status: DC
  Administered 2019-05-23 – 2019-05-31 (×9): 40 mg via ORAL
  Filled 2019-05-23 (×9): qty 1

## 2019-05-23 MED ORDER — ENOXAPARIN SODIUM 40 MG/0.4ML ~~LOC~~ SOLN: 40.0000 mg | SUBCUTANEOUS | Status: DC

## 2019-05-23 MED ORDER — TERBINAFINE HCL 250 MG PO TABS
250.0000 mg | ORAL_TABLET | Freq: Every day | ORAL | Status: DC
  Administered 2019-05-23 – 2019-05-31 (×9): 250 mg via ORAL
  Filled 2019-05-23 (×9): qty 1

## 2019-05-23 MED ORDER — SODIUM CHLORIDE 0.9 % IV SOLN
INTRAVENOUS | Status: DC | PRN
  Administered 2019-05-23 – 2019-05-24 (×2): 250 mL via INTRAVENOUS

## 2019-05-23 MED ORDER — IBUPROFEN 400 MG PO TABS
600.0000 mg | ORAL_TABLET | Freq: Once | ORAL | Status: AC
  Administered 2019-05-23: 600 mg via ORAL
  Filled 2019-05-23: qty 1

## 2019-05-23 MED ORDER — ENOXAPARIN SODIUM 40 MG/0.4ML ~~LOC~~ SOLN
40.0000 mg | SUBCUTANEOUS | Status: DC
  Administered 2019-05-23 – 2019-05-31 (×9): 40 mg via SUBCUTANEOUS
  Filled 2019-05-23 (×9): qty 0.4

## 2019-05-23 MED ORDER — PREGABALIN 75 MG PO CAPS
150.0000 mg | ORAL_CAPSULE | Freq: Two times a day (BID) | ORAL | Status: DC
  Administered 2019-05-23 – 2019-05-31 (×17): 150 mg via ORAL
  Filled 2019-05-23 (×3): qty 2
  Filled 2019-05-23: qty 3
  Filled 2019-05-23 (×13): qty 2

## 2019-05-23 MED ORDER — TIOTROPIUM BROMIDE MONOHYDRATE 1.25 MCG/ACT IN AERS: 2.0000 | INHALATION_SPRAY | Freq: Every day | RESPIRATORY_TRACT | Status: DC

## 2019-05-23 MED ORDER — INSULIN ASPART 100 UNIT/ML ~~LOC~~ SOLN
0.0000 [IU] | Freq: Every day | SUBCUTANEOUS | Status: DC
  Administered 2019-05-25 – 2019-05-27 (×3): 2 [IU] via SUBCUTANEOUS
  Administered 2019-05-29: 4 [IU] via SUBCUTANEOUS
  Administered 2019-05-30: 3 [IU] via SUBCUTANEOUS

## 2019-05-23 MED ORDER — ACETAMINOPHEN 325 MG PO TABS
650.0000 mg | ORAL_TABLET | Freq: Four times a day (QID) | ORAL | Status: DC | PRN
  Administered 2019-05-23 – 2019-05-24 (×3): 650 mg via ORAL
  Filled 2019-05-23 (×2): qty 2

## 2019-05-23 MED ORDER — ATORVASTATIN CALCIUM 10 MG PO TABS
20.0000 mg | ORAL_TABLET | Freq: Every day | ORAL | Status: DC
  Administered 2019-05-23 – 2019-05-30 (×8): 20 mg via ORAL
  Filled 2019-05-23 (×8): qty 2

## 2019-05-23 MED ORDER — METOPROLOL TARTRATE 50 MG PO TABS
50.0000 mg | ORAL_TABLET | Freq: Every day | ORAL | Status: DC
  Administered 2019-05-23 – 2019-05-31 (×9): 50 mg via ORAL
  Filled 2019-05-23: qty 1
  Filled 2019-05-23: qty 2
  Filled 2019-05-23 (×7): qty 1

## 2019-05-23 MED ORDER — TAMSULOSIN HCL 0.4 MG PO CAPS
0.8000 mg | ORAL_CAPSULE | Freq: Every evening | ORAL | Status: DC
  Administered 2019-05-23 – 2019-05-30 (×8): 0.8 mg via ORAL
  Filled 2019-05-23 (×7): qty 2

## 2019-05-23 MED ORDER — SODIUM CHLORIDE 0.9 % IV SOLN
2.0000 g | INTRAVENOUS | Status: DC
  Administered 2019-05-23 – 2019-05-30 (×8): 2 g via INTRAVENOUS
  Filled 2019-05-23: qty 2
  Filled 2019-05-23: qty 20
  Filled 2019-05-23 (×4): qty 2
  Filled 2019-05-23: qty 20
  Filled 2019-05-23 (×2): qty 2

## 2019-05-23 MED ORDER — MIRABEGRON ER 50 MG PO TB24
50.0000 mg | ORAL_TABLET | Freq: Every day | ORAL | Status: DC
  Administered 2019-05-23 – 2019-05-31 (×9): 50 mg via ORAL
  Filled 2019-05-23 (×9): qty 1

## 2019-05-23 MED ORDER — AMLODIPINE BESYLATE 10 MG PO TABS
10.0000 mg | ORAL_TABLET | Freq: Every day | ORAL | Status: DC
  Administered 2019-05-23 – 2019-05-31 (×9): 10 mg via ORAL
  Filled 2019-05-23 (×3): qty 1
  Filled 2019-05-23: qty 2
  Filled 2019-05-23 (×5): qty 1

## 2019-05-23 MED ORDER — CALCIUM CARBONATE ANTACID 500 MG PO CHEW
400.0000 mg | CHEWABLE_TABLET | Freq: Once | ORAL | Status: AC
  Administered 2019-05-23: 400 mg via ORAL
  Filled 2019-05-23: qty 2

## 2019-05-23 MED ORDER — SENNOSIDES-DOCUSATE SODIUM 8.6-50 MG PO TABS
2.0000 | ORAL_TABLET | Freq: Every day | ORAL | Status: DC
  Administered 2019-05-23 – 2019-05-30 (×8): 2 via ORAL
  Filled 2019-05-23 (×8): qty 2

## 2019-05-23 MED ORDER — BUPROPION HCL ER (XL) 150 MG PO TB24
300.0000 mg | ORAL_TABLET | Freq: Every day | ORAL | Status: DC
  Administered 2019-05-23 – 2019-05-30 (×8): 300 mg via ORAL
  Filled 2019-05-23 (×8): qty 2

## 2019-05-23 MED ORDER — VANCOMYCIN HCL 10 G IV SOLR
2000.0000 mg | Freq: Once | INTRAVENOUS | Status: AC
  Administered 2019-05-23: 2000 mg via INTRAVENOUS
  Filled 2019-05-23: qty 2000

## 2019-05-23 NOTE — ED Notes (Signed)
ED TO INPATIENT HANDOFF REPORT  ED Nurse Name and Phone #: Von Quintanar [phone]  S Name/Age/Gender Gregory [name] 58 y.o. male Room/Bed: 058C/058C  Code Status   Code Status: Full Code  Home/SNF/Other Rehab Patient oriented to: self, place, time and situation Is this baseline? 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 EMS reports some minor erythema w/ oozing. Pt reports shortness of breath, satting well on RA. Pt arrives in triage attempting to unbuckle EMS seatbelts and agitated, making demands. GCS 15, A&Ox4.    Allergies Allergies  Allergen Reactions  . Latex Other (See Comments)    Listed on MAR- pt sts he is not allergic.    Level of Care/Admitting Diagnosis ED Disposition    ED Disposition Condition [facility] Area: [place] [100100]  Level of Care: Telemetry Medical [104]  Covid Evaluation: Asymptomatic Screening Protocol (No Symptoms)  Diagnosis: Wound infection [id number]  Admitting Physician: Shela Leff [id number]  Attending Physician: Shela Leff [id number]  Estimated length of stay: past midnight tomorrow  Certification:: I certify this patient will need inpatient services for at least 2 midnights  PT Class (Do Not Modify): Inpatient [101]  PT Acc Code (Do Not Modify): Private [1]       B Medical/Surgery History Past Medical History:  Diagnosis Date  . Acquired absence of left great toe ([place])   . Acquired absence of right leg below knee ([place])   . Acute osteomyelitis of calcaneum, right ([place]) 10/02/2016  . Acute respiratory failure ([place])   . Amputation stump infection ([place]) 08/12/2018  . Anemia   . Basal cell carcinoma, eyelid   . Cancer ([place])   . Candidiasis 08/12/2018  . CHF (congestive heart failure) (HCC)    chronic diastolic   . Chronic back pain   . Chronic kidney disease    stage 3   .  Chronic multifocal osteomyelitis (HCC)    of ankle and foot   . Chronic pain syndrome   . COPD (chronic obstructive pulmonary disease) ([place])   . DDD (degenerative disc disease), lumbar   . Depression    major depressive disorder   . Diabetes mellitus without complication ([place])    type 2   . Diabetic ulcer of toe of left foot ([place]) 10/02/2016  . Difficulty in walking, not elsewhere classified   . 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 Foot drop, bilateral   . Foot drop, right 12/27/2015  . GERD (gastroesophageal reflux disease)   . Herpesviral vesicular dermatitis   . Hyperlipidemia   . Hyperlipidemia   . Hypertension   . Insomnia   . Malignant melanoma of other parts of face ([place])   . MRSA bacteremia   . Muscle weakness (generalized)   . Nasal congestion   . Necrosis of toe ([place]) 07/08/2018  . Neuromuscular dysfunction of bladder   . Osteomyelitis ([place])   . Osteomyelitis ([place])   . Osteomyelitis ([place])    right BKA  . Other idiopathic peripheral autonomic neuropathy   . Retention of urine, unspecified   . Squamous cell carcinoma of skin   . Unsteadiness on feet   . Urinary retention   . Urinary retention   . Urinary tract infection   . Wears dentures   . Wears glasses    Past Surgical History:  Procedure Laterality Date  .  AMPUTATION Left 03/29/2017   Procedure: LEFT GREAT TOE AMPUTATION AT METATARSOPHALANGEAL JOINT;  Surgeon: [name], MD;  Location: [facility];  Service: Orthopedics;  Laterality: Left;  . AMPUTATION Right 03/29/2017   Procedure: RIGHT BELOW KNEE AMPUTATION;  Surgeon: [name], MD;  Location: [facility];  Service: Orthopedics;  Laterality: Right;  . AMPUTATION Left 06/06/2018   Procedure: LEFT 2ND TOE AMPUTATION;  Surgeon: [name], MD;  Location: [facility];  Service: Orthopedics;  Laterality: Left;  . AMPUTATION Right 11/19/2018   Procedure: AMPUTATION ABOVE KNEE;  Surgeon: [name], MD;  Location: [facility];  Service: Orthopedics;  Laterality: Right;  .  ANTERIOR CERVICAL CORPECTOMY N/A 11/25/2015   Procedure: ANTERIOR CERVICAL FIVE CORPECTOMY Cervical four - six fusion;  Surgeon: Consuella Lose, MD;  Location: [facility];  Service: Neurosurgery;  Laterality: N/A;  ANTERIOR CERVICAL FIVE CORPECTOMY Cervical four - six fusion  . APPLICATION OF WOUND VAC Right 10/22/2018   Procedure: Application Of Wound Vac;  Surgeon: [name], MD;  Location: [facility];  Service: Orthopedics;  Laterality: Right;  . CYSTOSCOPY WITH RETROGRADE URETHROGRAM N/A 04/25/2018   Procedure: CYSTOSCOPY WITH RETROGRADE URETHROGRAM/ BALLOON DILATION;  Surgeon: Kathie Rhodes, MD;  Location[facility];  Service: Urology;  Laterality: N/A;  . MULTIPLE TOOTH EXTRACTIONS    . POSTERIOR CERVICAL FUSION/FORAMINOTOMY N/A 11/29/2015   Procedure: Cervical Three-Cervical Seven Posterior Cervical Laminectomy with Fusion;  Surgeon: Consuella Lose, MD;  Location: [facility];  Service: Neurosurgery;  Laterality: N/A;  Cervical Three-Cervical Seven Posterior Cervical Laminectomy with Fusion  . STUMP REVISION Right 10/22/2018   Procedure: REVISION RIGHT BELOW KNEE AMPUTATION;  Surgeon: [name], MD;  Location: [facility];  Service: Orthopedics;  Laterality: Right;  . STUMP REVISION Right 12/05/2018   Procedure: Revision Right Above Knee Amputation;  Surgeon: [name], MD;  Location: [facility];  Service: Orthopedics;  Laterality: Right;     A IV Location/Drains/Wounds Patient Lines/Drains/Airways Status   Active Line/Drains/Airways    Name:   Placement date:   Placement time:   Site:   Days:   Peripheral IV 05/23/19 Right Antecubital   05/23/19    0120    Antecubital   less than 1   Peripheral IV 05/23/19 Left Arm   05/23/19    0143    Arm   less than 1   Negative Pressure Wound Therapy Foot Left   06/06/18    1334    -   351   Negative Pressure Wound Therapy Leg Right   10/22/18    1223    -   213   Negative Pressure Wound Therapy Leg Right   11/19/18    1554    -   185   Negative  Pressure Wound Therapy Leg Right   12/05/18    1124    -   169   Incision (Closed) 11/29/15 Neck   11/29/15    1751     1271   Incision (Closed) 03/29/17 Leg Right   03/29/17    1058     785   Incision (Closed) 03/29/17 Foot Left   03/29/17    1058     785   Incision (Closed) 06/06/18 Foot Left   06/06/18    1301     351   Incision (Closed) 06/06/18 Foot Left   06/06/18    1305     351   Incision (Closed) 10/22/18 Leg Right  10/22/18    1202     213   Incision (Closed) 11/19/18 Leg Right   11/19/18    1553     185   Incision (Closed) 12/05/18 Leg Right   12/05/18    1104     169   Pressure Ulcer 11/10/15 Stage I -  Intact skin with non-blanchable redness of a localized area usually over a bony prominence.   11/10/15    0800     1290   Pressure Injury 06/03/18 Stage II -  Partial thickness loss of dermis presenting as a shallow open ulcer with a red, pink wound bed without slough.   06/03/18    2151     354   Pressure Injury 06/02/18 Stage II -  Partial thickness loss of dermis presenting as a shallow open ulcer with a red, pink wound bed without slough.   06/02/18    1105     355   Pressure Injury 06/09/18 Stage II -  Partial thickness loss of dermis presenting as a shallow open ulcer with a red, pink wound bed without slough.   06/09/18    1107     348   Wound / Incision (Open or Dehisced) 03/25/17 Diabetic ulcer Foot Right 3.5 x 3.5   03/25/17    2100    Foot   789   Wound / Incision (Open or Dehisced) 03/25/17 Diabetic ulcer Heel Right   03/25/17    2100    Heel   789   Wound / Incision (Open or Dehisced) 03/25/17 Diabetic ulcer Ankle Right   03/25/17    2100    Ankle   789   Wound / Incision (Open or Dehisced) 03/25/17 Diabetic ulcer Toe (Comment  which one) Right   03/25/17    2100    Toe (Comment  which one)   789   Wound / Incision (Open or Dehisced) 03/25/17 Diabetic ulcer Toe (Comment  which one) Left   03/25/17    2100    Toe (Comment  which one)   789   Wound / Incision (Open or  Dehisced) 11/19/18 Incision - Dehisced Toe (Comment  which one) Left open incision to left great toe   11/19/18    0820    Toe (Comment  which one)   185          Intake/Output Last 24 hours No intake or output data in the 24 hours ending 05/23/19 1536  Labs/Imaging Results for orders placed or performed during the hospital encounter of 05/22/19 (from the past 48 hour(s))  Lactic acid, plasma     Status: Abnormal   Collection Time: 05/22/19  5:58 PM  Result Value Ref Range   Lactic Acid, Venous 2.1 (HH) 0.5 - 1.9 mmol/L    Comment: CRITICAL RESULT CALLED TO, READ BACK BY AND VERIFIED WITH: Lyda Kalata, RN 1842 05/22/2019 WBOND Performed at [facility], [address] [place],  [address]   Comprehensive metabolic panel     Status: Abnormal   Collection Time: 05/22/19  5:58 PM  Result Value Ref Range   Sodium 135 135 - 145 mmol/L   Potassium 4.4 3.5 - 5.1 mmol/L   Chloride 100 98 - 111 mmol/L   CO2 23 22 - 32 mmol/L   Glucose, Bld 209 (H) 70 - 99 mg/dL   BUN 21 (H) 6 - 20 mg/dL   Creatinine, Ser 1.79 (H) 0.61 - 1.24 mg/dL   Calcium 9.0  8.9 - 10.3 mg/dL   Total Protein 7.4 6.5 - 8.1 g/dL   Albumin 3.3 (L) 3.5 - 5.0 g/dL   AST 13 (L) 15 - 41 U/L   ALT 12 0 - 44 U/L   Alkaline Phosphatase 90 38 - 126 U/L   Total Bilirubin 0.5 0.3 - 1.2 mg/dL   GFR calc non Af Amer 41 (L) >60 mL/min   GFR calc Af Amer 47 (L) >60 mL/min   Anion gap 12 5 - 15    Comment: Performed at [facility] [address] [place], [place] [address]  CBC with Differential     Status: Abnormal   Collection Time: 05/22/19  5:58 PM  Result Value Ref Range   WBC 21.9 (H) 4.0 - 10.5 K/uL   RBC 4.53 4.22 - 5.81 MIL/uL   Hemoglobin 11.8 (L) 13.0 - 17.0 g/dL   HCT 37.8 (L) 39.0 - 52.0 %   MCV 83.4 80.0 - 100.0 fL   MCH 26.0 26.0 - 34.0 pg   MCHC 31.2 30.0 - 36.0 g/dL   RDW 17.4 (H) 11.5 - 15.5 %   Platelets 256 150 - 400 K/uL   nRBC 0.0 0.0 - 0.2 %   Neutrophils Relative % 94 %   Neutro  Abs 20.6 (H) 1.7 - 7.7 K/uL   Lymphocytes Relative 2 %   Lymphs Abs 0.4 (L) 0.7 - 4.0 K/uL   Monocytes Relative 3 %   Monocytes Absolute 0.6 0.1 - 1.0 K/uL   Eosinophils Relative 0 %   Eosinophils Absolute 0.1 0.0 - 0.5 K/uL   Basophils Relative 0 %   Basophils Absolute 0.1 0.0 - 0.1 K/uL   Immature Granulocytes 1 %   Abs Immature Granulocytes 0.16 (H) 0.00 - 0.07 K/uL    Comment: Performed at [facility] [address], [place], [place] [address]  Urinalysis, Routine w reflex microscopic     Status: Abnormal   Collection Time: 05/22/19  9:54 PM  Result Value Ref Range   Color, Urine YELLOW YELLOW   APPearance CLEAR CLEAR   Specific Gravity, Urine 1.018 1.005 - 1.030   pH 5.0 5.0 - 8.0   Glucose, UA NEGATIVE NEGATIVE mg/dL   Hgb urine dipstick NEGATIVE NEGATIVE   Bilirubin Urine NEGATIVE NEGATIVE   Ketones, ur NEGATIVE NEGATIVE mg/dL   Protein, ur 100 (A) NEGATIVE mg/dL   Nitrite NEGATIVE NEGATIVE   Leukocytes,Ua NEGATIVE NEGATIVE   RBC / HPF 0-5 0 - 5 RBC/hpf   WBC, UA 0-5 0 - 5 WBC/hpf   Bacteria, UA NONE SEEN NONE SEEN    Comment: Performed at [facility] [address] [place], [place] [address]  SARS CORONAVIRUS 2 (TAT 6-24 HRS) Nasopharyngeal Nasopharyngeal Swab     Status: None   Collection Time: 05/23/19 12:56 AM   Specimen: Nasopharyngeal Swab  Result Value Ref Range   SARS Coronavirus 2 NEGATIVE NEGATIVE    Comment: (NOTE) SARS-CoV-2 target nucleic acids are NOT DETECTED. The SARS-CoV-2 RNA is generally detectable in upper and lower respiratory specimens during the acute phase of infection. Negative results do not preclude SARS-CoV-2 infection, do not rule out co-infections with other pathogens, and should not be used as the sole basis for treatment or other patient management decisions. Negative results must be combined with clinical observations, patient history, and epidemiological information. The expected result is Negative. Fact Sheet for  Patients: SugarRoll.be Fact Sheet for Healthcare Providers: https://www.woods-mathews.com/ This test is not yet  approved or cleared by the [place] and  has been authorized for detection and/or diagnosis of SARS-CoV-2 by FDA under an Emergency Use Authorization (EUA). This EUA will remain  in effect (meaning this test can be used) for the duration of the COVID-19 declaration under Section 56 4(b)(1) of the Act, 21 U.S.C. section 360bbb-3(b)(1), unless the authorization is terminated or revoked sooner. Performed at [facility], [place] [address] [place], [place] [address]   Lactic acid, plasma     Status: None   Collection Time: 05/23/19  1:16 AM  Result Value Ref Range   Lactic Acid, Venous 1.9 0.5 - 1.9 mmol/L    Comment: Performed at [facility] [address], [place], [place] [address]  Hemoglobin A1c     Status: Abnormal   Collection Time: 05/23/19  6:09 AM  Result Value Ref Range   Hgb A1c MFr Bld 8.6 (H) 4.8 - 5.6 %    Comment: (NOTE) Pre diabetes:          5.7%-6.4% Diabetes:              >6.4% Glycemic control for   <7.0% adults with diabetes    Mean Plasma Glucose 200.12 mg/dL    Comment: Performed at [facility] [address], [place],  [address]  CBG monitoring, ED     Status: Abnormal   Collection Time: 05/23/19  8:27 AM  Result Value Ref Range   Glucose-Capillary 152 (H) 70 - 99 mg/dL  CBG monitoring, ED     Status: Abnormal   Collection Time: 05/23/19 11:57 AM  Result Value Ref Range   Glucose-Capillary 179 (H) 70 - 99 mg/dL   Dg Chest 2 View  Result Date: 05/22/2019 CLINICAL DATA:  Fever EXAM: CHEST - 2 VIEW COMPARISON:  06/03/2018 FINDINGS: The heart size and mediastinal contours are within normal limits. Both lungs are clear. Surgical hardware in the lower cervical spine. IMPRESSION: No active cardiopulmonary disease. Electronically Signed   By: Donavan Foil M.D.   On:  05/22/2019 17:32   Dg Femur Min 2 Views Right  Result Date: 05/23/2019 CLINICAL DATA:  Flank pain at the site of right amputation EXAM: RIGHT FEMUR 2 VIEWS COMPARISON:  None. FINDINGS: No fracture seen. There is heterotopic ossification seen at the amputation site. No cortical destruction is noted. There is mild periosteal thickening of the femoral shaft. There is diffuse osteopenia. 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 IMPRESSION: No acute osseous abnormality. Electronically Signed   By: Prudencio Pair M.D.   On: 05/23/2019 02:23    Pending Labs Unresulted Labs (From admission, onward)    Start     Ordered   05/24/19 0500  CBC  Tomorrow morning,   R     05/23/19 0543   05/23/19 0056  Urine culture  ONCE - STAT,   STAT     05/23/19 0055   05/22/19 2213  Culture, blood (Routine x 2)  BLOOD CULTURE X 2,   STAT     05/22/19 2213          Vitals/Pain Today's Vitals   05/23/19 1149 05/23/19 1200 05/23/19 1232 05/23/19 1410  BP: (!) 133/102   110/64  Pulse:    79  Resp:    16  Temp:  98.1 F (36.7 C)    TempSrc:  Oral    SpO2:    95%  Weight:      Height:      PainSc:  Asleep     Isolation Precautions No active isolations  Medications Medications  vancomycin (VANCOCIN) 1,250 mg in sodium chloride 0.9 % 250 mL IVPB (has no administration in time range)  cefTRIAXone (ROCEPHIN) 2 g in sodium chloride 0.9 % 100 mL IVPB (has no administration in time range)  acetaminophen (TYLENOL) tablet 650 mg (650 mg Oral Given 05/23/19 0731)  nicotine (NICODERM CQ - dosed in mg/24 hours) patch 14 mg (14 mg Transdermal Patch Applied 05/23/19 0956)  insulin aspart (novoLOG) injection 0-9 Units (2 Units Subcutaneous Given 05/23/19 1310)  insulin aspart (novoLOG) injection 0-5 Units (has no administration in time range)  enoxaparin (LOVENOX) injection 40 mg (40 mg Subcutaneous Given 05/23/19 1401)  oxyCODONE (Oxy IR/ROXICODONE) immediate release tablet 20 mg (15 mg Oral Given 05/23/19 1145)  amLODipine  (NORVASC) tablet 10 mg (10 mg Oral Given 05/23/19 1149)  atorvastatin (LIPITOR) tablet 20 mg (has no administration in time range)  furosemide (LASIX) tablet 40 mg (40 mg Oral Given 05/23/19 1407)  metoprolol tartrate (LOPRESSOR) tablet 50 mg (50 mg Oral Given 05/23/19 1402)  terbinafine (LAMISIL) tablet 250 mg (250 mg Oral Given 05/23/19 1402)  buPROPion (WELLBUTRIN XL) 24 hr tablet 300 mg (has no administration in time range)  zolpidem (AMBIEN) tablet 5 mg (has no administration in time range)  PARoxetine (PAXIL) tablet 10 mg (has no administration in time range)  pantoprazole (PROTONIX) EC tablet 40 mg (40 mg Oral Given 05/23/19 1150)  senna-docusate (Senokot-S) tablet 2 tablet (has no administration in time range)  polyethylene glycol (MIRALAX / GLYCOLAX) packet 17 g (has no administration in time range)  finasteride (PROSCAR) tablet 5 mg (5 mg Oral Given 05/23/19 1403)  mirabegron ER (MYRBETRIQ) tablet 50 mg (50 mg Oral Given 05/23/19 1403)  oxybutynin (DITROPAN XL) 24 hr tablet 15 mg (has no administration in time range)  tamsulosin (FLOMAX) capsule 0.8 mg (has no administration in time range)  ferrous sulfate tablet 325 mg (has no administration in time range)  vitamin B-12 (CYANOCOBALAMIN) tablet 1,000 mcg (1,000 mcg Oral Given 05/23/19 1150)  pregabalin (LYRICA) capsule 150 mg (150 mg Oral Given 05/23/19 1148)  tiZANidine (ZANAFLEX) tablet 4 mg (4 mg Oral Given 05/23/19 1149)  albuterol (PROVENTIL) (2.5 MG/3ML) 0.083% nebulizer solution 3 mL (has no administration in time range)  morphine 2 MG/ML injection 2 mg (has no administration in time range)  umeclidinium bromide (INCRUSE ELLIPTA) 62.5 MCG/INH 1 puff (1 puff Inhalation Given 05/23/19 1401)  oxyCODONE (Oxy IR/ROXICODONE) immediate release tablet 5 mg (5 mg Oral Given 05/22/19 1813)  acetaminophen (TYLENOL) tablet 650 mg (650 mg Oral Given 05/22/19 2222)  cefTRIAXone (ROCEPHIN) 2 g in sodium chloride 0.9 % 100 mL IVPB (0 g Intravenous  Stopped 05/23/19 0154)  ibuprofen (ADVIL) tablet 600 mg (600 mg Oral Given 05/23/19 0125)  vancomycin (VANCOCIN) 2,000 mg in sodium chloride 0.9 % 500 mL IVPB (0 mg Intravenous Stopped 05/23/19 0559)  HYDROmorphone (DILAUDID) injection 1 mg (1 mg Intravenous Given 05/23/19 0127)    Mobility power wheelchair High fall risk   Focused Assessments [place]   R Recommendations: See Admitting Provider Note  Report given to:   Additional Notes:

## 2019-05-23 NOTE — Progress Notes (Signed)
Admitted by Dr Marlowe Sax this morning for right lower extremity pain at the amputation site with some drainage.  Patient seen and examined at bedside.  He is complaining of pain and drainage around the site.  Vital signs are currently stable.  But on physical examination he does have a right lower extremity stump with some drainage and erythema.  Left lower extremity toe amputations, poor hygiene.  Lab work showed elevated WBC at 21.9, fever of 102.  At this point continue IV vancomycin and Rocephin.  Supportive care.  Pain control. I have consulted Dr. Lorin Mercy from orthopedic will see the patient and also notify [name].  Discussed with nursing staff at bedside.  Call with further questions as needed.  Gerlean Ren MD

## 2019-05-23 NOTE — ED Notes (Signed)
Patient transported to X-ray 

## 2019-05-23 NOTE — H&P (Signed)
History and Physical    Gregory [name] [id number] DOB: Sep 16, 1960 DOA: 05/22/2019  PCP: [name], MD Patient coming from: Home  Chief Complaint: Right leg pain  HPI: Gregory [name] is a 58 y.o. 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  Patient states his amputation was done a year ago and since then he has had pain but it is worse now.  His home oxycodone is not helping.  He is requesting a stronger pain medication.  He is having fevers.  States his left leg is always swollen and unchanged from baseline.  Denies cough, shortness of breath, chest pain, nausea, vomiting, abdominal pain, or diarrhea.  No other complaints.  ED Course: Febrile with T-max 102.6 F.  WBC count 21.9 with left shift.  Lactic acid 2.1 >1.9.  Blood culture x2 pending.  SARS-CoV-2 test pending.  X-ray of right femur showing no acute osseous abnormality.  Chest x-ray showing no active disease. Patient received Tylenol, Dilaudid, ibuprofen, OxyIR, vancomycin, and ceftriaxone.  Review of Systems:  All systems reviewed and apart from history of presenting illness, are negative.  Past Medical History:  Diagnosis Date  . Acquired absence of left great toe ([place])   . Acquired absence of right leg below knee ([place])   . Acute osteomyelitis of calcaneum, right ([place]) 10/02/2016  . Acute respiratory failure ([place])   . Amputation stump infection ([place]) 08/12/2018  . Anemia   . Basal cell carcinoma, eyelid   . Cancer ([place])   . Candidiasis 08/12/2018  . CHF (congestive heart failure) (HCC)    chronic diastolic   . Chronic back pain   . Chronic kidney disease    stage 3   . Chronic multifocal osteomyelitis (HCC)    of ankle  and foot   . Chronic pain syndrome   . COPD (chronic obstructive pulmonary disease) ([place])   . DDD (degenerative disc disease), lumbar   . Depression    major depressive disorder   . Diabetes mellitus without complication ([place])    type 2   . Diabetic ulcer of toe of left foot ([place]) 10/02/2016  . Difficulty in walking, not elsewhere classified   . 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 Foot drop, bilateral   . Foot drop, right 12/27/2015  . GERD (gastroesophageal reflux disease)   . Herpesviral vesicular dermatitis   . Hyperlipidemia   . Hyperlipidemia   . Hypertension   . Insomnia   . Malignant melanoma of other parts of face ([place])   . MRSA bacteremia   . Muscle weakness (generalized)   . Nasal congestion   . Necrosis of toe ([place]) 07/08/2018  . Neuromuscular dysfunction of bladder   . Osteomyelitis ([place])   . Osteomyelitis ([place])   . Osteomyelitis ([place])    right BKA  . Other idiopathic peripheral autonomic neuropathy   . Retention of urine, unspecified   . Squamous cell carcinoma of skin   . Unsteadiness on feet   . Urinary retention   . Urinary retention   . Urinary tract infection   . Wears dentures   . Wears glasses     Past Surgical History:  Procedure Laterality Date  . AMPUTATION Left 03/29/2017   Procedure: LEFT GREAT TOE AMPUTATION AT METATARSOPHALANGEAL JOINT;  Surgeon: [name], MD;  Location: [facility];  Service: Orthopedics;  Laterality: Left;  . AMPUTATION Right 03/29/2017   Procedure: RIGHT BELOW KNEE AMPUTATION;  Surgeon: [name], MD;  Location: [facility];  Service: Orthopedics;  Laterality: Right;  . AMPUTATION Left 06/06/2018   Procedure: LEFT 2ND TOE AMPUTATION;  Surgeon: [name], MD;  Location: [facility];  Service: Orthopedics;  Laterality: Left;  . AMPUTATION Right 11/19/2018   Procedure: AMPUTATION ABOVE KNEE;  Surgeon: [name], MD;  Location: [facility];  Service: Orthopedics;  Laterality: Right;  . ANTERIOR CERVICAL CORPECTOMY N/A 11/25/2015    Procedure: ANTERIOR CERVICAL FIVE CORPECTOMY Cervical four - six fusion;  Surgeon: Consuella Lose, MD;  Location: [facility];  Service: Neurosurgery;  Laterality: N/A;  ANTERIOR CERVICAL FIVE CORPECTOMY Cervical four - six fusion  . APPLICATION OF WOUND VAC Right 10/22/2018   Procedure: Application Of Wound Vac;  Surgeon: [name], MD;  Location: [facility];  Service: Orthopedics;  Laterality: Right;  . CYSTOSCOPY WITH RETROGRADE URETHROGRAM N/A 04/25/2018   Procedure: CYSTOSCOPY WITH RETROGRADE URETHROGRAM/ BALLOON DILATION;  Surgeon: Kathie Rhodes, MD;  Location: [facility];  Service: Urology;  Laterality: N/A;  . MULTIPLE TOOTH EXTRACTIONS    . POSTERIOR CERVICAL FUSION/FORAMINOTOMY N/A 11/29/2015   Procedure: Cervical Three-Cervical Seven Posterior Cervical Laminectomy with Fusion;  Surgeon: Consuella Lose, MD;  Location: [facility];  Service: Neurosurgery;  Laterality: N/A;  Cervical Three-Cervical Seven Posterior Cervical Laminectomy with Fusion  . STUMP REVISION Right 10/22/2018   Procedure: REVISION RIGHT BELOW KNEE AMPUTATION;  Surgeon: [name], MD;  Location: [facility];  Service: Orthopedics;  Laterality: Right;  . STUMP REVISION Right 12/05/2018   Procedure: Revision Right Above Knee Amputation;  Surgeon: [name], MD;  Location: [facility];  Service: Orthopedics;  Laterality: Right;     reports that he has been smoking cigarettes. He has been smoking about 0.50 packs per day. He has never used smokeless tobacco. He reports that he does not drink alcohol or use drugs.  Allergies  Allergen Reactions  . Latex Other (See Comments)    Listed on MAR- pt sts he is not allergic.    Family History  Problem Relation Age of Onset  . Cancer Other   . Diabetes Other     Prior to Admission medications   Medication Sig Start Date End Date Taking? Authorizing Provider  acetaminophen (TYLENOL) 325 MG tablet Take 650 mg by mouth every 4 (four) hours as needed (general discomfort).      [provider]  albuterol (PROVENTIL HFA;VENTOLIN HFA) 108 (90 Base) MCG/ACT inhaler Inhale 2 puffs into the lungs every 6 (six) hours as needed for wheezing or shortness of breath.    [provider]  amLODipine (NORVASC) 10 MG tablet Take 10 mg by mouth daily. for HTN    [provider]  atorvastatin (LIPITOR) 20 MG tablet Take 20 mg by mouth at bedtime.    [provider]  bisacodyl (DULCOLAX) 10 MG suppository Place 1 suppository (10 mg total) rectally daily as needed for moderate constipation. 12/02/15   [name], MD  buPROPion (WELLBUTRIN XL) 300 MG 24 hr tablet Take 300 mg by mouth at bedtime.    [provider]  calcium carbonate (TUMS - DOSED IN MG ELEMENTAL CALCIUM) 500 MG chewable tablet Chew 2 tablets by mouth every 8 (eight) hours as needed for indigestion or heartburn.    [provider]  cetirizine (  ZYRTEC) 10 MG tablet Take 10 mg by mouth daily.    [provider]  CETYLPYRIDINIUM CHLORIDE MT Use as directed 15 mLs in the mouth or throat every 4 (four) hours as needed (dry mouth).    [provider]  Cholecalciferol (VITAMIN D-3) 5000 units TABS Take 5,000 Units by mouth daily.    [provider]  collagenase (SANTYL) ointment Apply 1 application topically daily. Apply to the affected area daily plus dry dressing 01/20/19   [name], MD  eszopiclone (LUNESTA) 1 MG TABS tablet Take 1 mg by mouth at bedtime. Take immediately before bedtime    [provider]  ferrous sulfate 325 (65 FE) MG tablet Take 1 tablet (325 mg total) by mouth daily with breakfast. 11/23/18   Matilde Haymaker, MD  finasteride (PROSCAR) 5 MG tablet Take 5 mg by mouth daily.    [provider]  furosemide (LASIX) 40 MG tablet Take 40 mg by mouth daily.    [provider]  glipiZIDE (GLUCOTROL) 10 MG tablet Take 10 mg by mouth daily before breakfast.    [provider]  glucagon (GLUCAGON EMERGENCY) 1  MG injection Inject 1 mg into the muscle once as needed (for CBG <70).     [provider]  guaiFENesin (MUCINEX) 600 MG 12 hr tablet Take 600 mg by mouth every 12 (twelve) hours as needed (congestion).  04/23/18   [provider]  insulin lispro (HUMALOG KWIKPEN) 100 UNIT/ML KwikPen Inject 0-10 Units into the skin See admin instructions. Inject 0-10 units per sliding scale 3 times daily before meals; CBG <70 = notify MD, 0-150 = 0 units, 151-200 = 2 units, 201-250 = 4 units, 251-300 = 6 units, 301-350 = 8 units, 351-400 = 10 units, >400 = notify MD    [provider]  lidocaine (LIDODERM) 5 % Place 1 patch onto the skin daily. Apply to Right stump. Remove & Discard patch within 12 hours or as directed by MD    [provider]  magnesium oxide (MAG-OX) 400 MG tablet Take 400 mg by mouth 2 (two) times daily.    [provider]  Menthol, Topical Analgesic, (BIOFREEZE) 4 % GEL Apply 1 application topically every 4 (four) hours as needed (pain). Apply to right stump    [provider]  metoprolol tartrate (LOPRESSOR) 50 MG tablet Take 50 mg by mouth daily.     [provider]  mirabegron ER (MYRBETRIQ) 50 MG TB24 tablet Take 50 mg by mouth daily.    [provider]  mometasone (NASONEX) 50 MCG/ACT nasal spray Place 2 sprays into the nose at bedtime.     [provider]  morphine (MS CONTIN) 15 MG 12 hr tablet Take 15 mg by mouth every 12 (twelve) hours.    [provider]  nicotine (NICODERM CQ - DOSED IN MG/24 HOURS) 14 mg/24hr patch Place 14 mg onto the skin daily.    [provider]  omeprazole (PRILOSEC) 40 MG capsule Take 40 mg by mouth daily before breakfast.     [provider]  ondansetron (ZOFRAN) 4 MG tablet Take 4 mg by mouth every 6 (six) hours as needed for nausea or vomiting.    [provider]  oxybutynin (DITROPAN XL) 15 MG 24 hr tablet Take 15 mg by mouth at bedtime.     [provider]  Oxycodone HCl 20 MG TABS Take 1 tablet (20 mg total) by mouth every 6 (six) hours as  needed (pain). 12/09/18   [name], MD  PARoxetine (PAXIL) 20 MG tablet Take 20 mg by mouth at bedtime.    [provider]  polyethylene glycol (MIRALAX / GLYCOLAX) packet Take 17 g by mouth daily as needed for mild constipation. 06/09/18   Shelly Coss, MD  pregabalin (LYRICA) 150 MG capsule Take 1 capsule (150 mg total) by mouth 2 (two) times daily. 10/24/18   Rayburn, Neta Mends, [name]  senna-docusate (SENOKOT-S) 8.6-50 MG tablet Take 2 tablets by mouth at bedtime.    [provider]  tamsulosin (FLOMAX) 0.4 MG CAPS capsule Take 0.8 mg by mouth every evening.  11/26/13   [provider]  terbinafine (LAMISIL) 250 MG tablet Take 250 mg by mouth daily.    [provider]  Tiotropium Bromide Monohydrate (SPIRIVA RESPIMAT) 1.25 MCG/ACT AERS Inhale 2 puffs into the lungs daily.    [provider]  tiZANidine (ZANAFLEX) 4 MG tablet Take 4 mg by mouth every 8 (eight) hours as needed for muscle spasms.    [provider]  vitamin B-12 (CYANOCOBALAMIN) 500 MCG tablet Take 1,000 mcg by mouth daily.     [provider]    Physical Exam: Vitals:   05/23/19 0058 05/23/19 0100 05/23/19 0244 05/23/19 0442  BP: 134/61 134/61 (!) 118/57 109/70  Pulse: 86 85 75 68  Resp: 19 (!) 26 19 (!) 23  Temp: (!) 102.6 F (39.2 C)     TempSrc: Rectal     SpO2: 95% 93% 94% 92%  Weight:      Height:        Physical Exam  Constitutional: He is oriented to person, place, and time. He appears well-developed and well-nourished. No distress.  HENT:  Head: Normocephalic.  Eyes: Right eye exhibits no discharge. Left eye exhibits no discharge.  Neck: Neck supple.  Cardiovascular: Normal rate, regular rhythm and intact distal pulses.  Pulmonary/Chest: Effort normal and breath sounds normal. No respiratory distress. He has no wheezes. He  has no rales.  Abdominal: Soft. Bowel sounds are normal. He exhibits no distension. There is no abdominal tenderness. There is no guarding.  Musculoskeletal:        General: Edema present.     Comments: Right lower extremity AKA stump site with erythema and some purulent drainage Significant edema, ulceration, and chronic venous stasis dermatitis of the left lower extremity  Neurological: He is alert and oriented to person, place, and time.  Skin: Skin is warm and dry. He is not diaphoretic.       Labs on Admission: I have personally reviewed following labs and imaging studies  CBC: Recent Labs  Lab 05/22/19 1758  WBC 21.9*  NEUTROABS 20.6*  HGB 11.8*  HCT 37.8*  MCV 83.4  PLT 123456   Basic Metabolic Panel: Recent Labs  Lab 05/22/19 1758  NA 135  K 4.4  CL 100  CO2 23  GLUCOSE 209*  BUN 21*  CREATININE 1.79*  CALCIUM 9.0   GFR: Estimated Creatinine Clearance: 52.5 mL/min (A) (by C-G formula based on SCr of 1.79 mg/dL (H)). Liver Function Tests: Recent Labs  Lab 05/22/19 1758  AST 13*  ALT 12  ALKPHOS 90  BILITOT 0.5  PROT 7.4  ALBUMIN 3.3*   No results for input(s): LIPASE, AMYLASE in the last 168 hours. No results for input(s): AMMONIA in the last 168 hours. Coagulation Profile: No results for input(s): INR, PROTIME in the last 168 hours. Cardiac Enzymes: No results for input(s): CKTOTAL,  CKMB, CKMBINDEX, TROPONINI in the last 168 hours. BNP (last 3 results) No results for input(s): PROBNP in the last 8760 hours. HbA1C: No results for input(s): HGBA1C in the last 72 hours. CBG: No results for input(s): GLUCAP in the last 168 hours. Lipid Profile: No results for input(s): CHOL, HDL, LDLCALC, TRIG, CHOLHDL, LDLDIRECT in the last 72 hours. Thyroid Function Tests: No results for input(s): TSH, T4TOTAL, FREET4, T3FREE, THYROIDAB in the last 72 hours. Anemia Panel: No results for input(s): VITAMINB12, FOLATE, FERRITIN, TIBC, IRON, RETICCTPCT in the last  72 hours. Urine analysis:    Component Value Date/Time   COLORURINE YELLOW 05/22/2019 2154   APPEARANCEUR CLEAR 05/22/2019 2154   LABSPEC 1.018 05/22/2019 2154   PHURINE 5.0 05/22/2019 2154   GLUCOSEU NEGATIVE 05/22/2019 2154   HGBUR NEGATIVE 05/22/2019 2154   [place] NEGATIVE 05/22/2019 2154   [place] NEGATIVE 05/22/2019 2154   PROTEINUR 100 (A) 05/22/2019 2154   UROBILINOGEN 1.0 02/03/2014 0052   NITRITE NEGATIVE 05/22/2019 2154   LEUKOCYTESUR NEGATIVE 05/22/2019 2154    Radiological Exams on Admission: Dg Chest 2 View  Result Date: 05/22/2019 CLINICAL DATA:  Fever EXAM: CHEST - 2 VIEW COMPARISON:  06/03/2018 FINDINGS: The heart size and mediastinal contours are within normal limits. Both lungs are clear. Surgical hardware in the lower cervical spine. IMPRESSION: No active cardiopulmonary disease. Electronically Signed   By: Donavan Foil M.D.   On: 05/22/2019 17:32   Dg Femur Min 2 Views Right  Result Date: 05/23/2019 CLINICAL DATA:  Flank pain at the site of right amputation EXAM: RIGHT FEMUR 2 VIEWS COMPARISON:  None. FINDINGS: No fracture seen. There is heterotopic ossification seen at the amputation site. No cortical destruction is noted. There is mild periosteal thickening of the femoral shaft. There is diffuse osteopenia. 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 IMPRESSION: No acute osseous abnormality. Electronically Signed   By: Prudencio Pair M.D.   On: 05/23/2019 02:23    EKG: Pending at this time.  Assessment/Plan Principal Problem:   Amputation stump infection ([place]) Active Problems:   Type II diabetes mellitus with neurological manifestations (HCC)   CKD (chronic kidney disease), stage III   Tobacco use   Sepsis ([place])   Sepsis secondary to infection of chronic ulcer at right AKA stump site Febrile with T-max 102.6 F.  WBC count 21.9 with left shift.  Lactic acid 2.1 >1.9.  Not tachycardic or hypotensive.  Erythema and some purulent drainage noted amputation stump  site concerning for infection/ purulent cellulitis.  X-ray without signs of osteomyelitis. -Continue vancomycin and ceftriaxone -Morphine as needed for pain -Tylenol as needed for fevers -Blood culture x2 pending -Continue to monitor WBC count  Idiopathic chronic venous hypertension and lymphedema of the left lower extremity with ulcer and inflammation Followed by [name].  Last office visit 11/16.  Recommendation is to wear compression stockings. -Will need continued outpatient follow-up  CKD stage III -Stable.  Creatinine 1.7, at baseline.  Chronic anemia -Stable.  Hemoglobin 11.8, above baseline.  Insulin-dependent type 2 diabetes -Check A1c.  Sliding scale insulin sensitive and CBG checks.  Chronic diastolic congestive heart failure Left lower extremity edema appears to be chronic.  Chest x-ray without signs of volume overload.  Chronic physical disability -PT evaluation   Tobacco use -NicoDerm patch -Counseling  Pharmacy med rec pending.  DVT prophylaxis: Loveno Code Status: Patient wishes to be full code. Family Communication: No family available. Disposition Plan: Anticipate discharge after clinical improvement. Consults called: None Admission status: It is  my clinical opinion that admission to INPATIENT is reasonable and necessary in this 58 y.o. male . presenting with sepsis secondary to infection of chronic ulcer at right AKA stump site.  Will need IV antibiotics for several days.  Given the aforementioned, the predictability of an adverse outcome is felt to be significant. I expect that the patient will require at least 2 midnights in the hospital to treat this condition.   The medical decision making on this patient was of high complexity and the patient is at high risk for clinical deterioration, therefore this is a level 3 visit.  Shela Leff MD [facility] Pager [phone]  If 7PM-7AM, please contact night-coverage [email or web address]  Password [email or web address]  05/23/2019, 5:53 AM

## 2019-05-23 NOTE — Progress Notes (Signed)
Pharmacy Antibiotic Note  Gregory [name] is a 58 y.o. male admitted on 05/22/2019 with wound infection.  Pharmacy has been consulted for Vancomycin dosing. WBC elevated. Noted renal dysfunction.   Plan: Vancomycin 1250 mg IV q24 >>Estimated AUC: 461 Ceftriaxone x 1 in the ED, f/u additional gram negative coverage Trend WBC, temp, renal function  F/U infectious work-up Drug levels as indicated   Height: 5\' 6"  (167.6 cm) Weight: 250 lb (113.4 kg) IBW/kg (Calculated) : 63.8  Temp (24hrs), Avg:100.9 F (38.3 C), Min:99.9 F (37.7 C), Max:102.6 F (39.2 C)  Recent Labs  Lab 05/22/19 1758 05/23/19 0116  WBC 21.9*  --   CREATININE 1.79*  --   LATICACIDVEN 2.1* 1.9    Estimated Creatinine Clearance: 52.5 mL/min (A) (by C-G formula based on SCr of 1.79 mg/dL (H)).    Allergies  Allergen Reactions  . Latex Other (See Comments)    Listed on MAR- pt sts he is not allergic.    Narda Bonds, PharmD, BCPS Clinical Pharmacist Phone: [phone]

## 2019-05-23 NOTE — Progress Notes (Signed)
Patient had a temperature of 101.4 when vitals were taken. MD paged and orders were put in. Will continue to monitor patients temperature.   Farley Ly RN

## 2019-05-23 NOTE — ED Notes (Signed)
Pt states that he needs his pain pills.

## 2019-05-23 NOTE — ED Notes (Signed)
Pt keeps requesting temp checks.

## 2019-05-23 NOTE — ED Notes (Signed)
Patient calls out frequently for pain medication. Md aware.

## 2019-05-23 NOTE — Progress Notes (Signed)
PT Cancellation Note  Patient Details Name: Gregory [name] MRN: [id number] DOB: [birth year]   Cancelled Treatment:    Reason Eval/Treat Not Completed: Pain limiting ability to participate. Pt reporting severe pain and withdrawal from pain meds. Eval attempted in ED. Awaiting bed for admission. PT to re-attempt as time allows and pt able to participate. He reports he was independent with transfers to/from his wheelchair at his [facility].    Lorriane Shire 05/23/2019, 10:42 AM   Lorrin Goodell, PT  Office # [phone] Pager [phone]

## 2019-05-23 NOTE — ED Notes (Signed)
Pt still constantly groaning out for pain medicine. MD notified 4 times now. No new orders. Waiting for med rec form to be faxed from [place]. Pharm tech has attempted multiple times. MD has been requested for 1 time pain med dose but no new orders at this time. Pt not keeping 5 lead or BP cuff on. VSS when they are on. Axox4.

## 2019-05-23 NOTE — Progress Notes (Signed)
NEW ADMISSION NOTE New Admission Note:   Arrival Method: stretcher from ED Mental Orientation: alert and oriented x4  Telemetry: box 18 Assessment: Completed Skin: dry, left leg swollen  IV: RAC Pain: 10 right amputation prior  Safety Measures: Safety Fall Prevention Plan has been given, discussed and signed Admission: Completed [facility] Orientation: Patient has been orientated to the room, unit and staff.  Family: NA  Orders have been reviewed and implemented. Will continue to monitor the patient. Call light has been placed within reach and bed alarm has been activated.   Baldo Ash, RN

## 2019-05-23 NOTE — ED Notes (Signed)
Tele ordered bfast 

## 2019-05-23 NOTE — ED Provider Notes (Signed)
[facility] EMERGENCY DEPARTMENT Provider Note   CSN: [id number] Arrival date & time: 05/22/19  1653     History   Chief Complaint Chief Complaint  Patient presents with   Leg Pain    HPI Gregory [name] is a 58 y.o. male.     HPI   58 year old male with history of basal cell carcinoma, CHF, CKD, osteomyelitis, COPD, depression, diabetes, hyperlipidemia, hypertension, right AKA, who presents the emergency department today for evaluation of right leg pain at the site of his right AKA which was completed 5 months ago.  States that he has had a nonhealing wound to this area and has had purulent drainage from it but starting today he had significantly worse pain.  There is also some redness and drainage that has persisted from the wound.  He denies any fevers.  He has several other complaints including chronic cough for the last several weeks, chronic shortness of breath, urinary frequency and dysuria.  He also complains of some nausea.  He denies any abdominal pain or diarrhea.  He denies any chest pain.  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)    Acquired absence of right leg below knee (HCC)    Acute osteomyelitis of calcaneum, right (HCC) 10/02/2016   Acute respiratory failure (HCC)    Amputation stump infection ([place]) 08/12/2018   Anemia    Basal cell carcinoma, eyelid    Cancer (HCC)    Candidiasis 08/12/2018   CHF (congestive heart failure) (HCC)    chronic diastolic    Chronic back pain    Chronic kidney disease    stage 3    Chronic multifocal osteomyelitis (HCC)    of ankle and foot    Chronic pain syndrome    COPD (chronic obstructive pulmonary disease) (HCC)    DDD (degenerative disc disease), lumbar    Depression    major depressive disorder    Diabetes mellitus without complication ([place])    type 2    Diabetic ulcer of toe of left foot ([place]) 10/02/2016   Difficulty in walking, not  elsewhere classified    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   Foot drop, bilateral    Foot drop, right 12/27/2015   GERD (gastroesophageal reflux disease)    Herpesviral vesicular dermatitis    Hyperlipidemia    Hyperlipidemia    Hypertension    Insomnia    Malignant melanoma of other parts of face ([place])    MRSA bacteremia    Muscle weakness (generalized)    Nasal congestion    Necrosis of toe (HCC) 07/08/2018   Neuromuscular dysfunction of bladder    Osteomyelitis (HCC)    Osteomyelitis (HCC)    Osteomyelitis (HCC)    right BKA   Other idiopathic peripheral autonomic neuropathy    Retention of urine, unspecified    Squamous cell carcinoma of skin    Unsteadiness on feet    Urinary retention    Urinary retention    Urinary tract infection    Wears dentures    Wears glasses     Patient Active Problem List   Diagnosis Date Noted   Acquired absence of right lower extremity above knee ([place]) 12/05/2018   Amputation stump infection ([place])    Lymphedema of left leg    Diabetic polyneuropathy associated with type 2 diabetes mellitus ([place])    Wound infection 11/16/2018   Below-knee amputation of right lower extremity ([place]) 10/22/2018   Dehiscence  of amputation stump ([place])    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[place]) 08/12/2018   Candidiasis 08/12/2018   Necrosis of toe ([place]) 07/08/2018   Amputated toe of left foot ([place]) 06/18/2018   Streptococcal infection    Pressure injury of skin 06/06/2018   Cutaneous abscess of left foot    Moderate protein-calorie malnutrition (HCC)    Cellulitis of left leg    Septic arthritis ([place]) 06/03/2018   Idiopathic chronic venous hypertension of left lower extremity with ulcer and inflammation ([place]) 11/14/2017   Great toe amputation status, left 11/14/2017   Acquired absence of right leg below knee ([place]) 06/20/2017   Osteomyelitis ([place]) 03/25/2017   Skin cancer 03/25/2017   Depression, recurrent  ([place]) 11/05/2016   Diabetic ulcer of toe of left foot ([place]) 10/02/2016   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   Chronic pain 09/27/2016   Dyslipidemia associated with type 2 diabetes mellitus ([place]) 06/06/2016   GERD without esophagitis 05/11/2016   Hypertensive heart disease with congestive heart failure ([place]) 12/15/2015   Spinal stenosis in cervical region 12/15/2015   Type II diabetes mellitus with neurological manifestations ([place]) 12/15/2015   Chronic diastolic CHF (congestive heart failure) ([place])    Pulmonary hypertension ([place])    Urinary retention    Diskitis    Foot drop, bilateral    Sepsis with acute renal failure without septic shock ([place])    MRSA bacteremia 10/03/2015   COPD (chronic obstructive pulmonary disease) ([place]) 10/03/2015   Acute osteomyelitis of left foot ([place]) 10/03/2015    Past Surgical History:  Procedure Laterality Date   AMPUTATION Left 03/29/2017   Procedure: LEFT GREAT TOE AMPUTATION AT METATARSOPHALANGEAL JOINT;  Surgeon: [name], MD;  Location: [facility];  Service: Orthopedics;  Laterality: Left;   AMPUTATION Right 03/29/2017   Procedure: RIGHT BELOW KNEE AMPUTATION;  Surgeon: [name], MD;  Location: [facility];  Service: Orthopedics;  Laterality: Right;   AMPUTATION Left 06/06/2018   Procedure: LEFT 2ND TOE AMPUTATION;  Surgeon: [name], MD;  Location: [facility];  Service: Orthopedics;  Laterality: Left;   AMPUTATION Right 11/19/2018   Procedure: AMPUTATION ABOVE KNEE;  Surgeon: [name], MD;  Location: [facility];  Service: Orthopedics;  Laterality: Right;   ANTERIOR CERVICAL CORPECTOMY N/A 11/25/2015   Procedure: ANTERIOR CERVICAL FIVE CORPECTOMY Cervical four - six fusion;  Surgeon: Consuella Lose, MD;  Location: [facility];  Service: Neurosurgery;  Laterality: N/A;  ANTERIOR CERVICAL FIVE CORPECTOMY Cervical four - six fusion   APPLICATION OF WOUND VAC Right 10/22/2018   Procedure: Application Of Wound Vac;  Surgeon: [name], MD;  Location: [facility];  Service: Orthopedics;  Laterality: Right;   CYSTOSCOPY WITH RETROGRADE URETHROGRAM N/A 04/25/2018   Procedure: CYSTOSCOPY WITH RETROGRADE URETHROGRAM/ BALLOON DILATION;  Surgeon: Kathie Rhodes, MD;  Location: [facility];  Service: Urology;  Laterality: N/A;   MULTIPLE TOOTH EXTRACTIONS     POSTERIOR CERVICAL FUSION/FORAMINOTOMY N/A 11/29/2015   Procedure: Cervical Three-Cervical Seven Posterior Cervical Laminectomy with Fusion;  Surgeon: Consuella Lose, MD;  Location: [facility];  Service: Neurosurgery;  Laterality: N/A;  Cervical Three-Cervical Seven Posterior Cervical Laminectomy with Fusion   STUMP REVISION Right 10/22/2018   Procedure: REVISION RIGHT BELOW KNEE AMPUTATION;  Surgeon: [name], MD;  Location: [facility];  Service: Orthopedics;  Laterality: Right;   STUMP REVISION Right 12/05/2018   Procedure: Revision Right Above Knee Amputation;  Surgeon: [name], MD;  Location: [facility];  Service: Orthopedics;  Laterality: Right;        Home Medications    Prior to Admission medications   Medication Sig Start Date End Date Taking? Authorizing Provider  acetaminophen (TYLENOL) 325 MG tablet Take 650 mg by mouth every 4 (four) hours as needed (general discomfort).     [provider]  albuterol (PROVENTIL HFA;VENTOLIN HFA) 108 (90 Base) MCG/ACT inhaler Inhale 2 puffs into the lungs every 6 (six) hours as needed for wheezing or shortness of breath.    [provider]  amLODipine (NORVASC) 10 MG tablet Take 10 mg by mouth daily. for HTN    [provider]  atorvastatin (LIPITOR) 20 MG tablet Take 20 mg by mouth at bedtime.    [provider]  bisacodyl (DULCOLAX) 10 MG suppository Place 1 suppository (10 mg total) rectally daily as needed for moderate constipation. 12/02/15   [name], MD  buPROPion (WELLBUTRIN XL) 300 MG 24 hr tablet Take 300 mg by mouth at bedtime.    [provider]  calcium carbonate (TUMS  - DOSED IN MG ELEMENTAL CALCIUM) 500 MG chewable tablet Chew 2 tablets by mouth every 8 (eight) hours as needed for indigestion or heartburn.    [provider]  cetirizine (ZYRTEC) 10 MG tablet Take 10 mg by mouth daily.    [provider]  CETYLPYRIDINIUM CHLORIDE MT Use as directed 15 mLs in the mouth or throat every 4 (four) hours as needed (dry mouth).    [provider]  Cholecalciferol (VITAMIN D-3) 5000 units TABS Take 5,000 Units by mouth daily.    [provider]  collagenase (SANTYL) ointment Apply 1 application topically daily. Apply to the affected area daily plus dry dressing 01/20/19   [name], MD  eszopiclone (LUNESTA) 1 MG TABS tablet Take 1 mg by mouth at bedtime. Take immediately before bedtime    [provider]  ferrous sulfate 325 (65 FE) MG tablet Take 1 tablet (325 mg total) by mouth daily with breakfast. 11/23/18   Matilde Haymaker, MD  finasteride (PROSCAR) 5 MG tablet Take 5 mg by mouth daily.    [provider]  furosemide (LASIX) 40 MG tablet Take 40 mg by mouth daily.    [provider]  glipiZIDE (GLUCOTROL) 10 MG tablet Take 10 mg by mouth daily before breakfast.    [provider]  glucagon (GLUCAGON EMERGENCY) 1 MG injection Inject 1 mg into the muscle once as needed (for CBG <70).     [provider]  guaiFENesin (MUCINEX) 600 MG 12 hr tablet Take 600 mg by mouth every 12 (twelve) hours as needed (congestion).  04/23/18   [provider]  insulin lispro (HUMALOG KWIKPEN) 100 UNIT/ML KwikPen Inject 0-10 Units into the skin See admin instructions. Inject 0-10 units per sliding scale 3 times daily before meals; CBG <70 = notify MD, 0-150 = 0 units, 151-200 = 2 units, 201-250 = 4 units, 251-300 = 6 units, 301-350 = 8 units, 351-400 = 10 units, >400 = notify MD    [provider]  lidocaine (LIDODERM) 5 % Place 1 patch onto the skin daily. Apply to Right stump. Remove & Discard  patch within 12 hours or as directed by MD    [provider]  magnesium oxide (MAG-OX) 400 MG tablet Take 400 mg by mouth 2 (two) times daily.    [provider]  Menthol, Topical Analgesic, (BIOFREEZE) 4 % GEL Apply 1 application topically every 4 (four) hours as  needed (pain). Apply to right stump    [provider]  metoprolol tartrate (LOPRESSOR) 50 MG tablet Take 50 mg by mouth daily.     [provider]  mirabegron ER (MYRBETRIQ) 50 MG TB24 tablet Take 50 mg by mouth daily.    [provider]  mometasone (NASONEX) 50 MCG/ACT nasal spray Place 2 sprays into the nose at bedtime.     [provider]  morphine (MS CONTIN) 15 MG 12 hr tablet Take 15 mg by mouth every 12 (twelve) hours.    [provider]  nicotine (NICODERM CQ - DOSED IN MG/24 HOURS) 14 mg/24hr patch Place 14 mg onto the skin daily.    [provider]  omeprazole (PRILOSEC) 40 MG capsule Take 40 mg by mouth daily before breakfast.     [provider]  ondansetron (ZOFRAN) 4 MG tablet Take 4 mg by mouth every 6 (six) hours as needed for nausea or vomiting.    [provider]  oxybutynin (DITROPAN XL) 15 MG 24 hr tablet Take 15 mg by mouth at bedtime.    [provider]  Oxycodone HCl 20 MG TABS Take 1 tablet (20 mg total) by mouth every 6 (six) hours as needed (pain). 12/09/18   [name], MD  PARoxetine (PAXIL) 20 MG tablet Take 20 mg by mouth at bedtime.    [provider]  polyethylene glycol (MIRALAX / GLYCOLAX) packet Take 17 g by mouth daily as needed for mild constipation. 06/09/18   Shelly Coss, MD  pregabalin (LYRICA) 150 MG capsule Take 1 capsule (150 mg total) by mouth 2 (two) times daily. 10/24/18   Rayburn, Neta Mends, [name]  senna-docusate (SENOKOT-S) 8.6-50 MG tablet Take 2 tablets by mouth at bedtime.    [provider]  tamsulosin (FLOMAX) 0.4 MG CAPS capsule Take 0.8 mg by mouth every  evening.  11/26/13   [provider]  terbinafine (LAMISIL) 250 MG tablet Take 250 mg by mouth daily.    [provider]  Tiotropium Bromide Monohydrate (SPIRIVA RESPIMAT) 1.25 MCG/ACT AERS Inhale 2 puffs into the lungs daily.    [provider]  tiZANidine (ZANAFLEX) 4 MG tablet Take 4 mg by mouth every 8 (eight) hours as needed for muscle spasms.    [provider]  vitamin B-12 (CYANOCOBALAMIN) 500 MCG tablet Take 1,000 mcg by mouth daily.     [provider]    Family History Family History  Problem Relation Age of Onset   Cancer Other    Diabetes Other     Social History Social History   Tobacco Use   Smoking status: Current Every Day Smoker    Packs/day: 0.50    Types: Cigarettes   Smokeless tobacco: Never Used   Tobacco comment: 11/19/18  Substance Use Topics   Alcohol use: No   Drug use: No     Allergies   Latex   Review of Systems Review of Systems  Constitutional: Negative for fever.  HENT: Negative for ear pain and sore throat.   Eyes: Negative for visual disturbance.  Respiratory: Positive for cough and shortness of breath.   Cardiovascular: Positive for leg swelling. Negative for chest pain.  Gastrointestinal: Positive for nausea. Negative for abdominal pain, constipation, diarrhea and vomiting.  Genitourinary: Negative for dysuria and hematuria.  Musculoskeletal: Negative for back pain.       Left pain  Skin: Positive for color change and wound.  Neurological: Negative for headaches.  All other  systems reviewed and are negative.    Physical Exam Updated Vital Signs BP (!) 118/57    Pulse 75    Temp (!) 102.6 F (39.2 C) (Rectal)    Resp 19    Ht 5\' 6"  (1.676 m)    Wt 113.4 kg    SpO2 94%    BMI 40.35 kg/m   Physical Exam Vitals signs and nursing note reviewed.  Constitutional:      Appearance: He is well-developed.  HENT:     Head: Normocephalic and atraumatic.  Eyes:     Conjunctiva/sclera:  Conjunctivae normal.  Neck:     Musculoskeletal: Neck supple.  Cardiovascular:     Rate and Rhythm: Normal rate and regular rhythm.     Heart sounds: Normal heart sounds. No murmur.  Pulmonary:     Effort: Pulmonary effort is normal. No respiratory distress.     Breath sounds: Normal breath sounds. No wheezing, rhonchi or rales.  Abdominal:     General: Bowel sounds are normal. There is no distension.     Palpations: Abdomen is soft.     Tenderness: There is no abdominal tenderness. There is no guarding or rebound.  Musculoskeletal:     Right lower leg: Edema present.     Left lower leg: Edema present.     Comments: Right aka with wound as pictured below  Skin:    General: Skin is warm and dry.  Neurological:     Mental Status: He is alert.         ED Treatments / Results  Labs (all labs ordered are listed, but only abnormal results are displayed) Labs Reviewed  LACTIC ACID, PLASMA - Abnormal; Notable for the following components:      Result Value   Lactic Acid, Venous 2.1 (*)    All other components within normal limits  COMPREHENSIVE METABOLIC PANEL - Abnormal; Notable for the following components:   Glucose, Bld 209 (*)    BUN 21 (*)    Creatinine, Ser 1.79 (*)    Albumin 3.3 (*)    AST 13 (*)    GFR calc non Af Amer 41 (*)    GFR calc Af Amer 47 (*)    All other components within normal limits  CBC WITH DIFFERENTIAL/PLATELET - Abnormal; Notable for the following components:   WBC 21.9 (*)    Hemoglobin 11.8 (*)    HCT 37.8 (*)    RDW 17.4 (*)    Neutro Abs 20.6 (*)    Lymphs Abs 0.4 (*)    Abs Immature Granulocytes 0.16 (*)    All other components within normal limits  URINALYSIS, ROUTINE W REFLEX MICROSCOPIC - Abnormal; Notable for the following components:   Protein, ur 100 (*)    All other components within normal limits  CULTURE, BLOOD (ROUTINE X 2)  CULTURE, BLOOD (ROUTINE X 2)  SARS CORONAVIRUS 2 (TAT 6-24 HRS)  URINE CULTURE  LACTIC ACID,  PLASMA    EKG None  Radiology Dg Chest 2 View  Result Date: 05/22/2019 CLINICAL DATA:  Fever EXAM: CHEST - 2 VIEW COMPARISON:  06/03/2018 FINDINGS: The heart size and mediastinal contours are within normal limits. Both lungs are clear. Surgical hardware in the lower cervical spine. IMPRESSION: No active cardiopulmonary disease. Electronically Signed   By: Donavan Foil M.D.   On: 05/22/2019 17:32   Dg Femur Min 2 Views Right  Result Date: 05/23/2019 CLINICAL DATA:  Flank pain at the site of right amputation EXAM:  RIGHT FEMUR 2 VIEWS COMPARISON:  None. FINDINGS: No fracture seen. There is heterotopic ossification seen at the amputation site. No cortical destruction is noted. There is mild periosteal thickening of the femoral shaft. There is diffuse osteopenia. 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 IMPRESSION: No acute osseous abnormality. Electronically Signed   By: Prudencio Pair M.D.   On: 05/23/2019 02:23    Procedures Procedures (including critical care time) CRITICAL CARE Performed by: Rodney Booze   Total critical care time: 32 minutes  Critical care time was exclusive of separately billable procedures and treating other patients.  Critical care was necessary to treat or prevent imminent or life-threatening deterioration.  Critical care was time spent personally by me on the following activities: development of treatment plan with patient and/or surrogate as well as nursing, discussions with consultants, evaluation of patient's response to treatment, examination of patient, obtaining history from patient or surrogate, ordering and performing treatments and interventions, ordering and review of laboratory studies, ordering and review of radiographic studies, pulse oximetry and re-evaluation of patient's condition.   Medications Ordered in ED Medications  vancomycin (VANCOCIN) 2,000 mg in sodium chloride 0.9 % 500 mL IVPB (2,000 mg Intravenous New Bag/Given 05/23/19 0248)  oxyCODONE  (Oxy IR/ROXICODONE) immediate release tablet 5 mg (5 mg Oral Given 05/22/19 1813)  acetaminophen (TYLENOL) tablet 650 mg (650 mg Oral Given 05/22/19 2222)  cefTRIAXone (ROCEPHIN) 2 g in sodium chloride 0.9 % 100 mL IVPB (0 g Intravenous Stopped 05/23/19 0154)  ibuprofen (ADVIL) tablet 600 mg (600 mg Oral Given 05/23/19 0125)  HYDROmorphone (DILAUDID) injection 1 mg (1 mg Intravenous Given 05/23/19 0127)     Initial Impression / Assessment and Plan / ED Course  I have reviewed the triage vital signs and the nursing notes.  Pertinent labs & imaging results that were available during my care of the patient were reviewed by me and considered in my medical decision making (see chart for details).     Final Clinical Impressions(s) / ED Diagnoses   Final diagnoses:  Wound infection   58 year old male presenting for evaluation of right leg pain.  Has chronic wound to the side of his right AKA which was completed several months ago.  Today pain worsened.  Associated with pus drainage.  On arrival patient noted to be febrile to 102.6, tachypneic.  Normal blood pressures.  Code sepsis initiated due to vital signs and suspected infection of right leg wound.  CBC with leukocytosis of 21,000. Anemia present but improved prior CMP with elevated cr, actually improved from baseline Lactic acid marginally elevated  UA neg for UTI COVID in process  EKG pending on admission  CXR negative Xray femur pending on admission   Pt given broad spectrum abx to cover infection of the RLE. 30cc/kg bolus not ordered prior to admission as patient was not hypotensive and lactic acid <4  1:40 AM CONSULT with Dr. Marlowe Sax who accepts patient for admission   ED Discharge Orders    None       Bishop Dublin 05/23/19 [id number]    Merrily Pew, MD 05/23/19 [phone]

## 2019-05-24 ENCOUNTER — Other Ambulatory Visit: Payer: Self-pay

## 2019-05-24 DIAGNOSIS — E440 Moderate protein-calorie malnutrition: Secondary | ICD-10-CM

## 2019-05-24 DIAGNOSIS — T8743 Infection of amputation stump, right lower extremity: Secondary | ICD-10-CM | POA: Diagnosis not present

## 2019-05-24 DIAGNOSIS — E1149 Type 2 diabetes mellitus with other diabetic neurological complication: Secondary | ICD-10-CM

## 2019-05-24 DIAGNOSIS — T8781 Dehiscence of amputation stump: Secondary | ICD-10-CM

## 2019-05-24 DIAGNOSIS — T148XXA Other injury of unspecified body region, initial encounter: Secondary | ICD-10-CM

## 2019-05-24 DIAGNOSIS — L089 Local infection of the skin and subcutaneous tissue, unspecified: Secondary | ICD-10-CM

## 2019-05-24 LAB — CBC
HCT: 35.4 % — ABNORMAL LOW (ref 39.0–52.0)
Hemoglobin: 10.8 g/dL — ABNORMAL LOW (ref 13.0–17.0)
MCH: 26 pg (ref 26.0–34.0)
MCHC: 30.5 g/dL (ref 30.0–36.0)
MCV: 85.3 fL (ref 80.0–100.0)
Platelets: 175 10*3/uL (ref 150–400)
RBC: 4.15 MIL/uL — ABNORMAL LOW (ref 4.22–5.81)
RDW: 18.3 % — ABNORMAL HIGH (ref 11.5–15.5)
WBC: 12.3 10*3/uL — ABNORMAL HIGH (ref 4.0–10.5)
nRBC: 0 % (ref 0.0–0.2)

## 2019-05-24 LAB — GLUCOSE, CAPILLARY
Glucose-Capillary: 146 mg/dL — ABNORMAL HIGH (ref 70–99)
Glucose-Capillary: 167 mg/dL — ABNORMAL HIGH (ref 70–99)
Glucose-Capillary: 213 mg/dL — ABNORMAL HIGH (ref 70–99)
Glucose-Capillary: 165 mg/dL — ABNORMAL HIGH (ref 70–99)

## 2019-05-24 LAB — URINE CULTURE

## 2019-05-24 MED ORDER — FLUTICASONE PROPIONATE 50 MCG/ACT NA SUSP
2.0000 | Freq: Every day | NASAL | Status: DC
  Filled 2019-05-24: qty 16

## 2019-05-24 MED ORDER — OXYMETAZOLINE HCL 0.05 % NA SOLN
2.0000 | Freq: Two times a day (BID) | NASAL | Status: DC
  Administered 2019-05-24 – 2019-05-31 (×9): 2 via NASAL
  Filled 2019-05-24: qty 30

## 2019-05-24 NOTE — H&P (View-Only) (Signed)
ORTHOPAEDIC CONSULTATION  REQUESTING PHYSICIAN: Damita Lack, MD  Chief Complaint: Ulcer and drainage right above-the-knee amputation.  HPI: Gregory [name] is a 58 y.o. male who presents with ulcer and drainage with exposed bone right above-the-knee amputation.  Patient has a history of uncontrolled type 2 diabetes as well as history of severe protein caloric malnutrition who has had multiple surgical interventions for infection right lower extremity.  Patient now has ulceration exposed bone and purulent drainage.  Past Medical History:  Diagnosis Date  . Acquired absence of left great toe ([place])   . Acquired absence of right leg below knee ([place])   . Acute osteomyelitis of calcaneum, right ([place]) 10/02/2016  . Acute respiratory failure ([place])   . Amputation stump infection ([place]) 08/12/2018  . Anemia   . Basal cell carcinoma, eyelid   . Cancer ([place])   . Candidiasis 08/12/2018  . CHF (congestive heart failure) (HCC)    chronic diastolic   . Chronic back pain   . Chronic kidney disease    stage 3   . Chronic multifocal osteomyelitis (HCC)    of ankle and foot   . Chronic pain syndrome   . COPD (chronic obstructive pulmonary disease) ([place])   . DDD (degenerative disc disease), lumbar   . Depression    major depressive disorder   . Diabetes mellitus without complication ([place])    type 2   . Diabetic ulcer of toe of left foot ([place]) 10/02/2016  . Difficulty in walking, not elsewhere classified   . 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 Foot drop, bilateral   . Foot drop, right 12/27/2015  . GERD (gastroesophageal reflux disease)   . Herpesviral vesicular dermatitis   . Hyperlipidemia   . Hyperlipidemia   . Hypertension   . Insomnia   . Malignant melanoma of other parts of face ([place])   . MRSA bacteremia   . Muscle weakness (generalized)   . Nasal congestion   . Necrosis of toe ([place]) 07/08/2018  . Neuromuscular dysfunction of bladder   . Osteomyelitis ([place])   . Osteomyelitis ([place])   .  Osteomyelitis ([place])    right BKA  . Other idiopathic peripheral autonomic neuropathy   . Retention of urine, unspecified   . Squamous cell carcinoma of skin   . Unsteadiness on feet   . Urinary retention   . Urinary retention   . Urinary tract infection   . Wears dentures   . Wears glasses    Past Surgical History:  Procedure Laterality Date  . AMPUTATION Left 03/29/2017   Procedure: LEFT GREAT TOE AMPUTATION AT METATARSOPHALANGEAL JOINT;  Surgeon: [name], MD;  Location: [facility];  Service: Orthopedics;  Laterality: Left;  . AMPUTATION Right 03/29/2017   Procedure: RIGHT BELOW KNEE AMPUTATION;  Surgeon: [name], MD;  Location: [facility];  Service: Orthopedics;  Laterality: Right;  . AMPUTATION Left 06/06/2018   Procedure: LEFT 2ND TOE AMPUTATION;  Surgeon: [name], MD;  Location: [facility];  Service: Orthopedics;  Laterality: Left;  . AMPUTATION Right 11/19/2018   Procedure: AMPUTATION ABOVE KNEE;  Surgeon: [name], MD;  Location: [facility];  Service: Orthopedics;  Laterality: Right;  . ANTERIOR CERVICAL CORPECTOMY N/A 11/25/2015   Procedure: ANTERIOR CERVICAL FIVE CORPECTOMY Cervical four - six fusion;  Surgeon: Consuella Lose, MD;  Location: [facility];  Service: Neurosurgery;  Laterality: N/A;  ANTERIOR CERVICAL FIVE CORPECTOMY Cervical four - six fusion  . APPLICATION OF WOUND VAC Right 10/22/2018  Procedure: Application Of Wound Vac;  Surgeon: [name], MD;  Location: [facility];  Service: Orthopedics;  Laterality: Right;  . CYSTOSCOPY WITH RETROGRADE URETHROGRAM N/A 04/25/2018   Procedure: CYSTOSCOPY WITH RETROGRADE URETHROGRAM/ BALLOON DILATION;  Surgeon: Kathie Rhodes, MD;  Location: [facility];  Service: Urology;  Laterality: N/A;  . MULTIPLE TOOTH EXTRACTIONS    . POSTERIOR CERVICAL FUSION/FORAMINOTOMY N/A 11/29/2015   Procedure: Cervical Three-Cervical Seven Posterior Cervical Laminectomy with Fusion;  Surgeon: Consuella Lose, MD;  Location: [facility];   Service: Neurosurgery;  Laterality: N/A;  Cervical Three-Cervical Seven Posterior Cervical Laminectomy with Fusion  . STUMP REVISION Right 10/22/2018   Procedure: REVISION RIGHT BELOW KNEE AMPUTATION;  Surgeon: [name], MD;  Location: [facility];  Service: Orthopedics;  Laterality: Right;  . STUMP REVISION Right 12/05/2018   Procedure: Revision Right Above Knee Amputation;  Surgeon: [name], MD;  Location: [facility];  Service: Orthopedics;  Laterality: Right;   Social History   Socioeconomic History  . Marital status: Single    Spouse name: Not on file  . Number of children: Not on file  . Years of education: Not on file  . Highest education level: Not on file  Occupational History  . Not on file  Social Needs  . Financial resource strain: Not on file  . Food insecurity    Worry: Not on file    Inability: Not on file  . Transportation needs    Medical: Not on file    Non-medical: Not on file  Tobacco Use  . Smoking status: Current Every Day Smoker    Packs/day: 0.50    Types: Cigarettes  . Smokeless tobacco: Never Used  . Tobacco comment: 11/19/18  Substance and Sexual Activity  . Alcohol use: No  . Drug use: No  . Sexual activity: Not on file  Lifestyle  . Physical activity    Days per week: Not on file    Minutes per session: Not on file  . Stress: Not on file  Relationships  . Social Herbalist on phone: Not on file    Gets together: Not on file    Attends religious service: Not on file    Active member of club or organization: Not on file    Attends meetings of clubs or organizations: Not on file    Relationship status: Not on file  Other Topics Concern  . Not on file  Social History Narrative  . Not on file   Family History  Problem Relation Age of Onset  . Cancer Other   . Diabetes Other    - negative except otherwise stated in the family history section Allergies  Allergen Reactions  . Latex Other (See Comments)    Listed on MAR- pt sts he  is not allergic.   Prior to Admission medications   Medication Sig Start Date End Date Taking? Authorizing Provider  acetaminophen (TYLENOL) 325 MG tablet Take 650 mg by mouth every 4 (four) hours as needed (general discomfort).    Yes [provider]  albuterol (PROVENTIL HFA;VENTOLIN HFA) 108 (90 Base) MCG/ACT inhaler Inhale 2 puffs into the lungs every 6 (six) hours as needed for wheezing or shortness of breath.   Yes [provider]  amLODipine (NORVASC) 10 MG tablet Take 10 mg by mouth daily. for HTN   Yes [provider]  atorvastatin (LIPITOR) 20 MG tablet Take 20 mg by mouth at bedtime.   Yes [provider]  bisacodyl (DULCOLAX) 10 MG suppository Place 1 suppository (10 mg total) rectally daily as needed for moderate constipation. 12/02/15  Yes [name], MD  buPROPion (WELLBUTRIN XL) 300 MG 24 hr tablet Take 300 mg by mouth at bedtime.   Yes [provider]  calcium carbonate (TUMS - DOSED IN MG ELEMENTAL CALCIUM) 500 MG chewable tablet Chew 2 tablets by mouth every 8 (eight) hours as needed for indigestion or heartburn.   Yes [provider]  cetirizine (ZYRTEC) 10 MG tablet Take 10 mg by mouth daily.   Yes [provider]  CETYLPYRIDINIUM CHLORIDE MT Use as directed 15 mLs in the mouth or throat every 4 (four) hours as needed (dry mouth).   Yes [provider]  Cholecalciferol (VITAMIN D-3) 5000 units TABS Take 5,000 Units by mouth daily.   Yes [provider]  collagenase (SANTYL) ointment Apply 1 application topically daily. Apply to the affected area daily plus dry dressing 01/20/19  Yes [name], MD  eszopiclone (LUNESTA) 1 MG TABS tablet Take 1 mg by mouth at bedtime. Take immediately before bedtime   Yes [provider]  ferrous sulfate 325 (65 FE) MG tablet Take 1 tablet (325 mg total) by mouth daily with breakfast. 11/23/18  Yes Matilde Haymaker, MD  finasteride (PROSCAR) 5 MG tablet Take 5 mg  by mouth daily.   Yes [provider]  furosemide (LASIX) 40 MG tablet Take 40 mg by mouth daily.   Yes [provider]  glipiZIDE (GLUCOTROL) 10 MG tablet Take 10 mg by mouth daily before breakfast.   Yes [provider]  insulin lispro (HUMALOG KWIKPEN) 100 UNIT/ML KwikPen Inject 0-10 Units into the skin See admin instructions. Inject 0-10 units per sliding scale 3 times daily before meals; CBG <70 = notify MD, 0-150 = 0 units, 151-200 = 2 units, 201-250 = 4 units, 251-300 = 6 units, 301-350 = 8 units, 351-400 = 10 units, >400 = notify MD   Yes [provider]  lidocaine (LIDODERM) 5 % Place 1 patch onto the skin daily. Apply to Right stump. Remove & Discard patch within 12 hours or as directed by MD   Yes [provider]  magnesium oxide (MAG-OX) 400 MG tablet Take 400 mg by mouth 2 (two) times daily.   Yes [provider]  Menthol, Topical Analgesic, (BIOFREEZE) 4 % GEL Apply 1 application topically every 4 (four) hours as needed (pain). Apply to right stump   Yes [provider]  metoprolol tartrate (LOPRESSOR) 50 MG tablet Take 50 mg by mouth daily.    Yes [provider]  mirabegron ER (MYRBETRIQ) 50 MG TB24 tablet Take 50 mg by mouth daily.   Yes [provider]  mometasone (NASONEX) 50 MCG/ACT nasal spray Place 2 sprays into the nose at bedtime.    Yes [provider]  nicotine (NICODERM CQ - DOSED IN MG/24 HOURS) 14 mg/24hr patch Place 14 mg onto the skin daily.   Yes [provider]  omeprazole (PRILOSEC) 40 MG capsule Take 40 mg by mouth daily before breakfast.    Yes [provider]  ondansetron (ZOFRAN) 4 MG tablet Take 4 mg by mouth every 6 (six) hours as needed for nausea or vomiting.   Yes [provider]  oxybutynin (DITROPAN XL) 15 MG 24 hr tablet Take 15 mg by mouth at bedtime.   Yes [provider]  Oxycodone HCl 10 MG TABS Take 10 mg by mouth 2 (  two) times  daily. Pt takes 10 mg at 12p & 2400.   Yes [provider]  Oxycodone HCl 20 MG TABS Take 1 tablet (20 mg total) by mouth every 6 (six) hours as needed (pain). Patient taking differently: Take 20 mg by mouth 2 (two) times daily. Pt takes at 6a and 1800. 12/09/18  Yes [name], MD  PARoxetine (PAXIL) 20 MG tablet Take 10 mg by mouth at bedtime.    Yes [provider]  polyethylene glycol (MIRALAX / GLYCOLAX) packet Take 17 g by mouth daily as needed for mild constipation. 06/09/18  Yes Shelly Coss, MD  pregabalin (LYRICA) 150 MG capsule Take 1 capsule (150 mg total) by mouth 2 (two) times daily. 10/24/18  Yes Rayburn, Neta Mends, [name]  senna-docusate (SENOKOT-S) 8.6-50 MG tablet Take 2 tablets by mouth at bedtime.   Yes [provider]  tamsulosin (FLOMAX) 0.4 MG CAPS capsule Take 0.8 mg by mouth every evening.  11/26/13  Yes [provider]  terbinafine (LAMISIL) 250 MG tablet Take 250 mg by mouth daily.   Yes [provider]  Tiotropium Bromide Monohydrate (SPIRIVA RESPIMAT) 1.25 MCG/ACT AERS Inhale 2 puffs into the lungs daily.   Yes [provider]  tiZANidine (ZANAFLEX) 4 MG tablet Take 4 mg by mouth every 8 (eight) hours as needed for muscle spasms.   Yes [provider]  vitamin B-12 (CYANOCOBALAMIN) 500 MCG tablet Take 1,000 mcg by mouth daily.    Yes [provider]  glucagon (GLUCAGON EMERGENCY) 1 MG injection Inject 1 mg into the muscle once as needed (for CBG <70).     [provider]   Dg Chest 2 View  Result Date: 05/22/2019 CLINICAL DATA:  Fever EXAM: CHEST - 2 VIEW COMPARISON:  06/03/2018 FINDINGS: The heart size and mediastinal contours are within normal limits. Both lungs are clear. Surgical hardware in the lower cervical spine. IMPRESSION: No active cardiopulmonary disease. Electronically Signed   By: Donavan Foil M.D.   On: 05/22/2019 17:32   Dg Femur Min 2 Views Right  Result Date:  05/23/2019 CLINICAL DATA:  Flank pain at the site of right amputation EXAM: RIGHT FEMUR 2 VIEWS COMPARISON:  None. FINDINGS: No fracture seen. There is heterotopic ossification seen at the amputation site. No cortical destruction is noted. There is mild periosteal thickening of the femoral shaft. There is diffuse osteopenia. 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 IMPRESSION: No acute osseous abnormality. Electronically Signed   By: Prudencio Pair M.D.   On: 05/23/2019 02:23   - pertinent xrays, CT, MRI studies were reviewed and independently interpreted  Positive ROS: All other systems have been reviewed and were otherwise negative with the exception of those mentioned in the HPI and as above.  Physical Exam: General: Alert, no acute distress Psychiatric: Patient is competent for consent with normal mood and affect Lymphatic: No axillary or cervical lymphadenopathy Cardiovascular:  pedal edema left foot Respiratory: No cyanosis, no use of accessory musculature GI: No organomegaly, abdomen is soft and non-tender    Images:  @ENCIMAGES @  Labs:  Lab Results  Component Value Date   HGBA1C 8.6 (H) 05/23/2019   HGBA1C 7.7 (H) 11/17/2018   HGBA1C 8.0 (H) 06/04/2018   ESRSEDRATE 120 (H) 03/25/2017   ESRSEDRATE 84 (H) 10/02/2016   ESRSEDRATE 132 (H) 12/01/2015   CRP 4.9 (H) 03/25/2017   CRP 30.6 (H) 10/02/2016   CRP 13.7 (H) 11/11/2015   REPTSTATUS 11/17/2018 FINAL 11/16/2018   GRAMSTAIN  06/06/2018  RARE WBC PRESENT, PREDOMINANTLY PMN FEW GRAM POSITIVE COCCI    CULT  11/16/2018    NO GROWTH Performed at [facility] [address] [place], [place] [address]    LABORGA PROVIDENCIA [name] (A) 01/12/2017    Lab Results  Component Value Date   ALBUMIN 3.3 (L) 05/22/2019   ALBUMIN 3.3 (L) 10/22/2018   ALBUMIN 2.2 (L) 06/04/2018   PREALBUMIN 23.3 03/25/2017    Neurologic: Patient does not have protective sensation bilateral lower extremities.   MUSCULOSKELETAL:    Skin: Examination left lower extremity he has venous and lymphatic insufficiency with swelling of the foot and leg with hard induration of the skin there is no cellulitis no tenderness to palpation no pain with range of motion of the ankle no clinical signs of DVT or infection.  Examination of the right above-knee amputation his thigh is soft nontender to palpation no cellulitis.  He has an ulcer approximately 3 cm in diameter with exposed bone and purulent drainage.  Review of the radiographs shows heterotopic ossification of the right femur extending up to the ulcer.  An MRI scan has been ordered of the right femur.  Patient's general medical condition has deteriorated since the last time I have seen him.  Assessment: Assessment: Uncontrolled type 2 diabetes with protein caloric malnutrition with ulcer abscess and osteomyelitis of the right above-the-knee amputation.  Plan: Plan: Discussed with the patient ideal treatment is to proceed with revision of the  Above knee amputation.  Discussed that since the ulcer is open and draining surgery is not urgent.  Patient states that he is reluctant to proceed with further surgery.  He will discussed this with his daughter Gregory [name] and will let me know tomorrow if he wants to proceed with surgery.  I will cancel the MRI scan.  Thank you for the consult and the opportunity to see Gregory [name], [place] [phone] 8:46 AM

## 2019-05-24 NOTE — Progress Notes (Signed)
PROGRESS NOTE    Gregory [name]  [name] DOB: 1960/12/06 DOA: 05/22/2019 PCP: [name], MD   Brief Narrative:  58 year old with history of right AKA with chronic stump infection, multifocal osteomyelitis, chronic pain syndrome, chronic diastolic congestive heart failure, CKD stage III, COPD, DM 2, HTN, HLD came to the hospital with complains of right leg pain with some drainage around that site.  Upon admission he was noted to be febrile with elevated WBC with concerns of infection of the right lower extremity stump.  X-ray was negative for any bony involvement.  Orthopedic, Dr. Lorin Mercy was consulted.   Assessment & Plan:   Principal Problem:   Amputation stump infection ([place]) Active Problems:   Type II diabetes mellitus with neurological manifestations ([place])   CKD (chronic kidney disease), stage III   Tobacco use   Sepsis ([place])   Sepsis secondary to infected right femoral stump/status post right AKA with chronic ulcer -Sepsis physiology.  Slowly improving.  Follow-up culture data.  No obvious wound dehiscence -Continue IV vancomycin and Rocephin -Pain control, bowel regimen. -X-ray negative for bony involvement.   -Seen by orthopedic-recommending revision of above-knee amputation.  Will await their final decision.  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  Supportive care.  CKD stage IIIb -Creatinine baseline at 1.7.  Continue to monitor.  Diabetes mellitus type 2, poorly controlled secondary to hyperglycemia -Insulin sliding scale and Accu-Chek -A1c-8.6  Essential hypertension -Norvasc 10 mg daily.  Metoprolol 50 mg daily.  Chronic diastolic congestive heart failure -Clinically appears to be euvolemic.  Does have chronic lower extremity edema.  Anemia of chronic disease -Baseline hemoglobin around 11.  No obvious evidence of bleeding. Cont Iron Supplements.   HLD= Cont Lipitor.   DVT prophylaxis: Lovenox Code  Status: Full code Family Communication: None Disposition Plan: Maintain hospital stay for IV antibiotics concerns for infected right upper knee amputation ulceration site with osteomyelitis.  He will require revision of this with patient to decide.  Consultants:   Orthopedic  Procedures:   None   Antimicrobials:   IV Vanc, Roc   Subjective: Patient is upset this morning that he has to get another surgical procedure for revision of his amputation and infection.  No new complaints besides pain at the site.  Review of Systems Otherwise negative except as per HPI, including: General = no fevers, chills, dizziness, malaise, fatigue HEENT/EYES = negative for pain, redness, loss of vision, double vision, blurred vision, loss of hearing, sore throat, hoarseness, dysphagia Cardiovascular= negative for chest pain, palpitation, murmurs, lower extremity swelling Respiratory/lungs= negative for shortness of breath, cough, hemoptysis, wheezing, mucus production Gastrointestinal= negative for nausea, vomiting,, abdominal pain, melena, hematemesis Genitourinary= negative for Dysuria, Hematuria, Change in Urinary Frequency MSK = Negative for arthralgia, myalgias, Back Pain, Joint swelling  Neurology= Negative for headache, seizures, numbness, tingling  Psychiatry= Negative for anxiety, depression, suicidal and homocidal ideation Allergy/Immunology= Medication/Food allergy as listed  Skin= Negative for Rash, lesions, ulcers, itching   Objective: Vitals:   05/23/19 2349 05/24/19 0028 05/24/19 0201 05/24/19 0554  BP:    129/65  Pulse:    80  Resp:    18  Temp: 98.6 F (37 C) 99.3 F (37.4 C) 99.2 F (37.3 C) 98.7 F (37.1 C)  TempSrc: Oral Oral Oral Oral  SpO2:    93%  Weight:      Height:        Intake/Output Summary (Last 24 hours) at 05/24/2019 [id number] Last data filed  at 05/24/2019 0600 Gross per 24 hour  Intake 895.12 ml  Output 750 ml  Net 145.12 ml   Filed Weights   05/22/19  1701  Weight: 113.4 kg    Examination: Constitutional: Not in acute distress Respiratory: Clear to auscultation bilaterally Cardiovascular: Normal sinus rhythm, no rubs Abdomen: Nontender nondistended good bowel sounds Musculoskeletal: No edema noted Skin: Right lower extremity above the knee amputation with infected site with area of erythema.  Left lower extremity chronic lower extremity skin changes with darkening of the skin. Neurologic: CN 2-12 grossly intact.  And nonfocal Psychiatric: Normal judgment and insight. Alert and oriented x 3. Normal mood.     Data Reviewed:   CBC: Recent Labs  Lab 05/22/19 1758 05/24/19 0537  WBC 21.9* 12.3*  NEUTROABS 20.6*  --   HGB 11.8* 10.8*  HCT 37.8* 35.4*  MCV 83.4 85.3  PLT 256 0000000   Basic Metabolic Panel: Recent Labs  Lab 05/22/19 1758  NA 135  K 4.4  CL 100  CO2 23  GLUCOSE 209*  BUN 21*  CREATININE 1.79*  CALCIUM 9.0   GFR: Estimated Creatinine Clearance: 52.5 mL/min (A) (by C-G formula based on SCr of 1.79 mg/dL (H)). Liver Function Tests: Recent Labs  Lab 05/22/19 1758  AST 13*  ALT 12  ALKPHOS 90  BILITOT 0.5  PROT 7.4  ALBUMIN 3.3*   No results for input(s): LIPASE, AMYLASE in the last 168 hours. No results for input(s): AMMONIA in the last 168 hours. Coagulation Profile: No results for input(s): INR, PROTIME in the last 168 hours. Cardiac Enzymes: No results for input(s): CKTOTAL, CKMB, CKMBINDEX, TROPONINI in the last 168 hours. BNP (last 3 results) No results for input(s): PROBNP in the last 8760 hours. HbA1C: Recent Labs    05/23/19 0609  HGBA1C 8.6*   CBG: Recent Labs  Lab 05/23/19 0827 05/23/19 1157 05/23/19 1702 05/23/19 2142 05/24/19 0704  GLUCAP 152* 179* 188* 175* 146*   Lipid Profile: No results for input(s): CHOL, HDL, LDLCALC, TRIG, CHOLHDL, LDLDIRECT in the last 72 hours. Thyroid Function Tests: No results for input(s): TSH, T4TOTAL, FREET4, T3FREE, THYROIDAB in the last  72 hours. Anemia Panel: No results for input(s): VITAMINB12, FOLATE, FERRITIN, TIBC, IRON, RETICCTPCT in the last 72 hours. Sepsis Labs: Recent Labs  Lab 05/22/19 1758 05/23/19 0116  LATICACIDVEN 2.1* 1.9    Recent Results (from the past 240 hour(s))  SARS CORONAVIRUS 2 (TAT 6-24 HRS) Nasopharyngeal Nasopharyngeal Swab     Status: None   Collection Time: 05/23/19 12:56 AM   Specimen: Nasopharyngeal Swab  Result Value Ref Range Status   SARS Coronavirus 2 NEGATIVE NEGATIVE Final    Comment: (NOTE) SARS-CoV-2 target nucleic acids are NOT DETECTED. The SARS-CoV-2 RNA is generally detectable in upper and lower respiratory specimens during the acute phase of infection. Negative results do not preclude SARS-CoV-2 infection, do not rule out co-infections with other pathogens, and should not be used as the sole basis for treatment or other patient management decisions. Negative results must be combined with clinical observations, patient history, and epidemiological information. The expected result is Negative. Fact Sheet for Patients: SugarRoll.be Fact Sheet for Healthcare Providers: https://www.woods-mathews.com/ This test is not yet approved or cleared by the [place] FDA and  has been authorized for detection and/or diagnosis of SARS-CoV-2 by FDA under an Emergency Use Authorization (EUA). This EUA will remain  in effect (meaning this test can be used) for the duration of the COVID-19 declaration under Section 56  4(b)(1) of the Act, 21 U.S.C. section 360bbb-3(b)(1), unless the authorization is terminated or revoked sooner. Performed at [facility], [place] [address] [place], [place] [address]          Radiology Studies: Dg Chest 2 View  Result Date: 05/22/2019 CLINICAL DATA:  Fever EXAM: CHEST - 2 VIEW COMPARISON:  06/03/2018 FINDINGS: The heart size and mediastinal contours are within normal limits. Both lungs are  clear. Surgical hardware in the lower cervical spine. IMPRESSION: No active cardiopulmonary disease. Electronically Signed   By: Donavan Foil M.D.   On: 05/22/2019 17:32   Dg Femur Min 2 Views Right  Result Date: 05/23/2019 CLINICAL DATA:  Flank pain at the site of right amputation EXAM: RIGHT FEMUR 2 VIEWS COMPARISON:  None. FINDINGS: No fracture seen. There is heterotopic ossification seen at the amputation site. No cortical destruction is noted. There is mild periosteal thickening of the femoral shaft. There is diffuse osteopenia. 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 IMPRESSION: No acute osseous abnormality. Electronically Signed   By: Prudencio Pair M.D.   On: 05/23/2019 02:23        Scheduled Meds:  amLODipine  10 mg Oral Daily   atorvastatin  20 mg Oral QHS   buPROPion  300 mg Oral QHS   enoxaparin (LOVENOX) injection  40 mg Subcutaneous Q24H   ferrous sulfate  325 mg Oral Q breakfast   finasteride  5 mg Oral Daily   fluticasone  2 spray Each Nare Daily   furosemide  40 mg Oral Daily   insulin aspart  0-5 Units Subcutaneous QHS   insulin aspart  0-9 Units Subcutaneous TID WC   metoprolol tartrate  50 mg Oral Daily   mirabegron ER  50 mg Oral Daily   nicotine  14 mg Transdermal Daily   oxybutynin  15 mg Oral QHS   pantoprazole  40 mg Oral Daily   PARoxetine  10 mg Oral QHS   pregabalin  150 mg Oral BID   senna-docusate  2 tablet Oral QHS   tamsulosin  0.8 mg Oral QPM   terbinafine  250 mg Oral Daily   umeclidinium bromide  1 puff Inhalation Daily   vitamin B-12  1,000 mcg Oral Daily   Continuous Infusions:  sodium chloride 250 mL (05/24/19 0551)   cefTRIAXone (ROCEPHIN)  IV Stopped (05/23/19 2351)   vancomycin 1,250 mg (05/24/19 0553)     LOS: 1 day   Time spent=40 mins    Verlon Carcione Arsenio Loader, MD [facility]  If 7PM-7AM, please contact night-coverage  05/24/2019, 7:53 AM

## 2019-05-24 NOTE — Plan of Care (Signed)
  Problem: Education: Goal: Knowledge of General Education information will improve Description Including pain rating scale, medication(s)/side effects and non-pharmacologic comfort measures Outcome: Progressing   

## 2019-05-24 NOTE — Evaluation (Signed)
Physical Therapy Evaluation Patient Details Name: Gregory [name] MRN: [id number] DOB: 12-16-1960 Today's Date: 05/24/2019   History of Present Illness  58 y.o. 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    Clinical Impression  Pt admitted with above diagnosis. PTA pt mod I mobility at wheelchair level. He is a long term SNF resident. On eval, pt required supervision bed mobility. He declined transfer OOB due to pain. Pt easily agitated, becoming frustrated/agitated with education on importance of mobility and role of PT in acute care. [name] is recommending repeat revision of R AKA due to wound with exposed bone. Pt unsure if he wants to proceed. Pt currently with functional limitations due to the deficits listed below (see PT Problem List). Pt will benefit from skilled PT to increase their independence and safety with mobility to allow discharge to the venue listed below.       Follow Up Recommendations SNF    Equipment Recommendations  None recommended by PT    Recommendations for Other Services       Precautions / Restrictions Precautions Precautions: Fall      Mobility  Bed Mobility Overal bed mobility: Needs Assistance Bed Mobility: Supine to Sit;Sit to Supine     Supine to sit: Supervision;HOB elevated Sit to supine: Supervision;HOB elevated   General bed mobility comments: +rail, supervision for safety  Transfers                 General transfer comment: Pt declining due to pain.  Ambulation/Gait             General Gait Details: nonambulatory  Stairs            Wheelchair Mobility    Modified Rankin (Stroke Patients Only)       Balance Overall  balance assessment: Needs assistance Sitting-balance support: No upper extremity supported;Feet supported Sitting balance-Leahy Scale: Good                                       Pertinent Vitals/Pain Pain Assessment: Faces Faces Pain Scale: Hurts even more Pain Location: R AKA Pain Descriptors / Indicators: Sore;Aching;Constant Pain Intervention(s): Premedicated before session;Limited activity within patient's tolerance    Home Living Family/patient expects to be discharged to:: Skilled nursing facility                 Additional Comments: permanent resident of facility    Prior Function Level of Independence: Needs assistance   Gait / Transfers Assistance Needed: mod I at wheelchair level  ADL's / Homemaking Assistance Needed: Reports can dress/bathe self- goes into the shower        Hand Dominance   Dominant Hand: Right    Extremity/Trunk Assessment   Upper Extremity Assessment Upper Extremity Assessment: Overall WFL for tasks assessed    Lower Extremity Assessment Lower Extremity Assessment: RLE deficits/detail RLE Deficits / Details: h/o R AKA. Open wound with drainage at stump site.       Communication   Communication: HOH  Cognition Arousal/Alertness: Awake/alert Behavior During Therapy: Agitated;WFL for tasks assessed/performed Overall Cognitive Status: Within Functional Limits for tasks assessed  General Comments: easily agitated. Gets frustrated with line of questioning.      General Comments      Exercises     Assessment/Plan    PT Assessment Patient needs continued PT services  PT Problem List Decreased mobility;Pain;Decreased balance;Decreased activity tolerance       PT Treatment Interventions Therapeutic activities;Therapeutic exercise;Patient/family education;Balance training;Functional mobility training    PT Goals (Current goals can be found in the Care Plan  section)  Acute Rehab PT Goals Patient Stated Goal: decrease pain PT Goal Formulation: With patient Time For Goal Achievement: 06/07/19 Potential to Achieve Goals: Good    Frequency Min 2X/week   Barriers to discharge        Co-evaluation               AM-PAC PT "6 Clicks" Mobility  Outcome Measure Help needed turning from your back to your side while in a flat bed without using bedrails?: None Help needed moving from lying on your back to sitting on the side of a flat bed without using bedrails?: A Little Help needed moving to and from a bed to a chair (including a wheelchair)?: A Little Help needed standing up from a chair using your arms (e.g., wheelchair or bedside chair)?: A Little Help needed to walk in hospital room?: Total Help needed climbing 3-5 steps with a railing? : Total 6 Click Score: 15    End of Session   Activity Tolerance: Treatment limited secondary to agitation Patient left: in bed;with call bell/phone within reach Nurse Communication: Mobility status PT Visit Diagnosis: Other abnormalities of gait and mobility (R26.89);Pain Pain - Right/Left: Right Pain - part of body: Leg    Time: [id number] PT Time Calculation (min) (ACUTE ONLY): 10 min   Charges:   PT Evaluation $PT Eval Moderate Complexity: 1 Mod          Lorrin Goodell, PT  Office # [phone] Pager [phone]   Lorriane Shire 05/24/2019, 9:52 AM

## 2019-05-24 NOTE — Plan of Care (Signed)
  Problem: Clinical Measurements: Goal: Ability to maintain clinical measurements within normal limits will improve Outcome: Progressing   

## 2019-05-24 NOTE — Consult Note (Addendum)
ORTHOPAEDIC CONSULTATION  REQUESTING PHYSICIAN: Damita Lack, MD  Chief Complaint: Ulcer and drainage right above-the-knee amputation.  HPI: Gregory [name] is a 58 y.o. male who presents with ulcer and drainage with exposed bone right above-the-knee amputation.  Patient has a history of uncontrolled type 2 diabetes as well as history of severe protein caloric malnutrition who has had multiple surgical interventions for infection right lower extremity.  Patient now has ulceration exposed bone and purulent drainage.  Past Medical History:  Diagnosis Date  . Acquired absence of left great toe ([place])   . Acquired absence of right leg below knee ([place])   . Acute osteomyelitis of calcaneum, right ([place]) 10/02/2016  . Acute respiratory failure ([place])   . Amputation stump infection ([place]) 08/12/2018  . Anemia   . Basal cell carcinoma, eyelid   . Cancer ([place])   . Candidiasis 08/12/2018  . CHF (congestive heart failure) (H[facility])    chronic diastolic   . Chronic back pain   . Chronic kidney disease    stage 3   . Chronic multifocal osteomyelitis ([facility])    of ankle and foot   . Chronic pain syndrome   . COPD (chronic obstructive pulmonary disease) ([place])   . DDD (degenerative disc disease), lumbar   . Depression    major depressive disorder   . Diabetes mellitus without complication ([place])    type 2   . Diabetic ulcer of toe of left foot ([place]) 10/02/2016  . Difficulty in walking, not elsewhere classified   . 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 Foot drop, bilateral   . Foot drop, right 12/27/2015  . GERD (gastroesophageal reflux disease)   . Herpesviral vesicular dermatitis   . Hyperlipidemia   . Hyperlipidemia   . Hypertension   . Insomnia   . Malignant melanoma of other parts of face ([place])   . MRSA bacteremia   . Muscle weakness (generalized)   . Nasal congestion   . Necrosis of toe ([place]) 07/08/2018  . Neuromuscular dysfunction of bladder   . Osteomyelitis ([place])   . Osteomyelitis ([place])   .  Osteomyelitis ([place])    right BKA  . Other idiopathic peripheral autonomic neuropathy   . Retention of urine, unspecified   . Squamous cell carcinoma of skin   . Unsteadiness on feet   . Urinary retention   . Urinary retention   . Urinary tract infection   . Wears dentures   . Wears glasses    Past Surgical History:  Procedure Laterality Date  . AMPUTATION Left 03/29/2017   Procedure: LEFT GREAT TOE AMPUTATION AT METATARSOPHALANGEAL JOINT;  Surgeon: [name], MD;  Location: [facility];  Service: Orthopedics;  Laterality: Left;  . AMPUTATION Right 03/29/2017   Procedure: RIGHT BELOW KNEE AMPUTATION;  Surgeon: [name], MD;  Location: [facility];  Service: Orthopedics;  Laterality: Right;  . AMPUTATION Left 06/06/2018   Procedure: LEFT 2ND TOE AMPUTATION;  Surgeon: [name], MD;  Location: [facility];  Service: Orthopedics;  Laterality: Left;  . AMPUTATION Right 11/19/2018   Procedure: AMPUTATION ABOVE KNEE;  Surgeon: [name], MD;  Location: [facility];  Service: Orthopedics;  Laterality: Right;  . ANTERIOR CERVICAL CORPECTOMY N/A 11/25/2015   Procedure: ANTERIOR CERVICAL FIVE CORPECTOMY Cervical four - six fusion;  Surgeon: Consuella Lose, MD;  Location: [facility];  Service: Neurosurgery;  Laterality: N/A;  ANTERIOR CERVICAL FIVE CORPECTOMY Cervical four - six fusion  . APPLICATION OF WOUND VAC Right 10/22/2018  Procedure: Application Of Wound Vac;  Surgeon: [name], MD;  Location: [facility];  Service: Orthopedics;  Laterality: Right;  . CYSTOSCOPY WITH RETROGRADE URETHROGRAM N/A 04/25/2018   Procedure: CYSTOSCOPY WITH RETROGRADE URETHROGRAM/ BALLOON DILATION;  Surgeon: Kathie Rhodes, MD;  Location: [facility];  Service: Urology;  Laterality: N/A;  . MULTIPLE TOOTH EXTRACTIONS    . POSTERIOR CERVICAL FUSION/FORAMINOTOMY N/A 11/29/2015   Procedure: Cervical Three-Cervical Seven Posterior Cervical Laminectomy with Fusion;  Surgeon: Consuella Lose, MD;  Location: [facility];   Service: Neurosurgery;  Laterality: N/A;  Cervical Three-Cervical Seven Posterior Cervical Laminectomy with Fusion  . STUMP REVISION Right 10/22/2018   Procedure: REVISION RIGHT BELOW KNEE AMPUTATION;  Surgeon: [name], MD;  Location: [facility];  Service: Orthopedics;  Laterality: Right;  . STUMP REVISION Right 12/05/2018   Procedure: Revision Right Above Knee Amputation;  Surgeon: [name], MD;  Location: [facility];  Service: Orthopedics;  Laterality: Right;   Social History   Socioeconomic History  . Marital status: Single    Spouse name: Not on file  . Number of children: Not on file  . Years of education: Not on file  . Highest education level: Not on file  Occupational History  . Not on file  Social Needs  . Financial resource strain: Not on file  . Food insecurity    Worry: Not on file    Inability: Not on file  . Transportation needs    Medical: Not on file    Non-medical: Not on file  Tobacco Use  . Smoking status: Current Every Day Smoker    Packs/day: 0.50    Types: Cigarettes  . Smokeless tobacco: Never Used  . Tobacco comment: 11/19/18  Substance and Sexual Activity  . Alcohol use: No  . Drug use: No  . Sexual activity: Not on file  Lifestyle  . Physical activity    Days per week: Not on file    Minutes per session: Not on file  . Stress: Not on file  Relationships  . Social Herbalist on phone: Not on file    Gets together: Not on file    Attends religious service: Not on file    Active member of club or organization: Not on file    Attends meetings of clubs or organizations: Not on file    Relationship status: Not on file  Other Topics Concern  . Not on file  Social History Narrative  . Not on file   Family History  Problem Relation Age of Onset  . Cancer Other   . Diabetes Other    - negative except otherwise stated in the family history section Allergies  Allergen Reactions  . Latex Other (See Comments)    Listed on MAR- pt sts he  is not allergic.   Prior to Admission medications   Medication Sig Start Date End Date Taking? Authorizing Provider  acetaminophen (TYLENOL) 325 MG tablet Take 650 mg by mouth every 4 (four) hours as needed (general discomfort).    Yes [provider]  albuterol (PROVENTIL HFA;VENTOLIN HFA) 108 (90 Base) MCG/ACT inhaler Inhale 2 puffs into the lungs every 6 (six) hours as needed for wheezing or shortness of breath.   Yes [provider]  amLODipine (NORVASC) 10 MG tablet Take 10 mg by mouth daily. for HTN   Yes [provider]  atorvastatin (LIPITOR) 20 MG tablet Take 20 mg by mouth at bedtime.   Yes [provider]  bisacodyl (DULCOLAX) 10 MG suppository Place 1 suppository (10 mg total) rectally daily as needed for moderate constipation. 12/02/15  Yes [name], MD  buPROPion (WELLBUTRIN XL) 300 MG 24 hr tablet Take 300 mg by mouth at bedtime.   Yes [provider]  calcium carbonate (TUMS - DOSED IN MG ELEMENTAL CALCIUM) 500 MG chewable tablet Chew 2 tablets by mouth every 8 (eight) hours as needed for indigestion or heartburn.   Yes [provider]  cetirizine (ZYRTEC) 10 MG tablet Take 10 mg by mouth daily.   Yes [provider]  CETYLPYRIDINIUM CHLORIDE MT Use as directed 15 mLs in the mouth or throat every 4 (four) hours as needed (dry mouth).   Yes [provider]  Cholecalciferol (VITAMIN D-3) 5000 units TABS Take 5,000 Units by mouth daily.   Yes [provider]  collagenase (SANTYL) ointment Apply 1 application topically daily. Apply to the affected area daily plus dry dressing 01/20/19  Yes [name], MD  eszopiclone (LUNESTA) 1 MG TABS tablet Take 1 mg by mouth at bedtime. Take immediately before bedtime   Yes [provider]  ferrous sulfate 325 (65 FE) MG tablet Take 1 tablet (325 mg total) by mouth daily with breakfast. 11/23/18  Yes Matilde Haymaker, MD  finasteride (PROSCAR) 5 MG tablet Take 5 mg  by mouth daily.   Yes [provider]  furosemide (LASIX) 40 MG tablet Take 40 mg by mouth daily.   Yes [provider]  glipiZIDE (GLUCOTROL) 10 MG tablet Take 10 mg by mouth daily before breakfast.   Yes [provider]  insulin lispro (HUMALOG KWIKPEN) 100 UNIT/ML KwikPen Inject 0-10 Units into the skin See admin instructions. Inject 0-10 units per sliding scale 3 times daily before meals; CBG <70 = notify MD, 0-150 = 0 units, 151-200 = 2 units, 201-250 = 4 units, 251-300 = 6 units, 301-350 = 8 units, 351-400 = 10 units, >400 = notify MD   Yes [provider]  lidocaine (LIDODERM) 5 % Place 1 patch onto the skin daily. Apply to Right stump. Remove & Discard patch within 12 hours or as directed by MD   Yes [provider]  magnesium oxide (MAG-OX) 400 MG tablet Take 400 mg by mouth 2 (two) times daily.   Yes [provider]  Menthol, Topical Analgesic, (BIOFREEZE) 4 % GEL Apply 1 application topically every 4 (four) hours as needed (pain). Apply to right stump   Yes [provider]  metoprolol tartrate (LOPRESSOR) 50 MG tablet Take 50 mg by mouth daily.    Yes [provider]  mirabegron ER (MYRBETRIQ) 50 MG TB24 tablet Take 50 mg by mouth daily.   Yes [provider]  mometasone (NASONEX) 50 MCG/ACT nasal spray Place 2 sprays into the nose at bedtime.    Yes [provider]  nicotine (NICODERM CQ - DOSED IN MG/24 HOURS) 14 mg/24hr patch Place 14 mg onto the skin daily.   Yes [provider]  omeprazole (PRILOSEC) 40 MG capsule Take 40 mg by mouth daily before breakfast.    Yes [provider]  ondansetron (ZOFRAN) 4 MG tablet Take 4 mg by mouth every 6 (six) hours as needed for nausea or vomiting.   Yes [provider]  oxybutynin (DITROPAN XL) 15 MG 24 hr tablet Take 15 mg by mouth at bedtime.   Yes [provider]  Oxycodone HCl 10 MG TABS Take 10 mg by mouth 2 (  two) times  daily. Pt takes 10 mg at 12p & 2400.   Yes [provider]  Oxycodone HCl 20 MG TABS Take 1 tablet (20 mg total) by mouth every 6 (six) hours as needed (pain). Patient taking differently: Take 20 mg by mouth 2 (two) times daily. Pt takes at 6a and 1800. 12/09/18  Yes [name], MD  PARoxetine (PAXIL) 20 MG tablet Take 10 mg by mouth at bedtime.    Yes [provider]  polyethylene glycol (MIRALAX / GLYCOLAX) packet Take 17 g by mouth daily as needed for mild constipation. 06/09/18  Yes Shelly Coss, MD  pregabalin (LYRICA) 150 MG capsule Take 1 capsule (150 mg total) by mouth 2 (two) times daily. 10/24/18  Yes Rayburn, Neta Mends, [name]  senna-docusate (SENOKOT-S) 8.6-50 MG tablet Take 2 tablets by mouth at bedtime.   Yes [provider]  tamsulosin (FLOMAX) 0.4 MG CAPS capsule Take 0.8 mg by mouth every evening.  11/26/13  Yes [provider]  terbinafine (LAMISIL) 250 MG tablet Take 250 mg by mouth daily.   Yes [provider]  Tiotropium Bromide Monohydrate (SPIRIVA RESPIMAT) 1.25 MCG/ACT AERS Inhale 2 puffs into the lungs daily.   Yes [provider]  tiZANidine (ZANAFLEX) 4 MG tablet Take 4 mg by mouth every 8 (eight) hours as needed for muscle spasms.   Yes [provider]  vitamin B-12 (CYANOCOBALAMIN) 500 MCG tablet Take 1,000 mcg by mouth daily.    Yes [provider]  glucagon (GLUCAGON EMERGENCY) 1 MG injection Inject 1 mg into the muscle once as needed (for CBG <70).     [provider]   Dg Chest 2 View  Result Date: 05/22/2019 CLINICAL DATA:  Fever EXAM: CHEST - 2 VIEW COMPARISON:  06/03/2018 FINDINGS: The heart size and mediastinal contours are within normal limits. Both lungs are clear. Surgical hardware in the lower cervical spine. IMPRESSION: No active cardiopulmonary disease. Electronically Signed   By: Donavan Foil M.D.   On: 05/22/2019 17:32   Dg Femur Min 2 Views Right  Result Date:  05/23/2019 CLINICAL DATA:  Flank pain at the site of right amputation EXAM: RIGHT FEMUR 2 VIEWS COMPARISON:  None. FINDINGS: No fracture seen. There is heterotopic ossification seen at the amputation site. No cortical destruction is noted. There is mild periosteal thickening of the femoral shaft. There is diffuse osteopenia. 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 IMPRESSION: No acute osseous abnormality. Electronically Signed   By: Prudencio Pair M.D.   On: 05/23/2019 02:23   - pertinent xrays, CT, MRI studies were reviewed and independently interpreted  Positive ROS: All other systems have been reviewed and were otherwise negative with the exception of those mentioned in the HPI and as above.  Physical Exam: General: Alert, no acute distress Psychiatric: Patient is competent for consent with normal mood and affect Lymphatic: No axillary or cervical lymphadenopathy Cardiovascular:  pedal edema left foot Respiratory: No cyanosis, no use of accessory musculature GI: No organomegaly, abdomen is soft and non-tender    Images:  @ENCIMAGES @  Labs:  Lab Results  Component Value Date   HGBA1C 8.6 (H) 05/23/2019   HGBA1C 7.7 (H) 11/17/2018   HGBA1C 8.0 (H) 06/04/2018   ESRSEDRATE 120 (H) 03/25/2017   ESRSEDRATE 84 (H) 10/02/2016   ESRSEDRATE 132 (H) 12/01/2015   CRP 4.9 (H) 03/25/2017   CRP 30.6 (H) 10/02/2016   CRP 13.7 (H) 11/11/2015   REPTSTATUS 11/17/2018 FINAL 11/16/2018   GRAMSTAIN  06/06/2018  RARE WBC PRESENT, PREDOMINANTLY PMN FEW GRAM POSITIVE COCCI    CULT  11/16/2018    NO GROWTH Performed at [facility] [address], [place], [place] [address]    LABORGA PROVIDENCIA [name] (A) 01/12/2017    Lab Results  Component Value Date   ALBUMIN 3.3 (L) 05/22/2019   ALBUMIN 3.3 (L) 10/22/2018   ALBUMIN 2.2 (L) 06/04/2018   PREALBUMIN 23.3 03/25/2017    Neurologic: Patient does not have protective sensation bilateral lower extremities.   MUSCULOSKELETAL:    Skin: Examination left lower extremity he has venous and lymphatic insufficiency with swelling of the foot and leg with hard induration of the skin there is no cellulitis no tenderness to palpation no pain with range of motion of the ankle no clinical signs of DVT or infection.  Examination of the right above-knee amputation his thigh is soft nontender to palpation no cellulitis.  He has an ulcer approximately 3 cm in diameter with exposed bone and purulent drainage.  Review of the radiographs shows heterotopic ossification of the right femur extending up to the ulcer.  An MRI scan has been ordered of the right femur.  Patient's general medical condition has deteriorated since the last time I have seen him.  Assessment: Assessment: Uncontrolled type 2 diabetes with protein caloric malnutrition with ulcer abscess and osteomyelitis of the right above-the-knee amputation.  Plan: Plan: Discussed with the patient ideal treatment is to proceed with revision of the  Above knee amputation.  Discussed that since the ulcer is open and draining surgery is not urgent.  Patient states that he is reluctant to proceed with further surgery.  He will discussed this with his daughter Joellen Jersey and will let me know tomorrow if he wants to proceed with surgery.  I will cancel the MRI scan.  Thank you for the consult and the opportunity to see Gregory [name], [place] [phone] 8:46 AM

## 2019-05-25 ENCOUNTER — Other Ambulatory Visit: Payer: Self-pay | Admitting: Physician Assistant

## 2019-05-25 LAB — CBC
HCT: 30.1 % — ABNORMAL LOW (ref 39.0–52.0)
Hemoglobin: 9.2 g/dL — ABNORMAL LOW (ref 13.0–17.0)
MCH: 25.7 pg — ABNORMAL LOW (ref 26.0–34.0)
MCHC: 30.6 g/dL (ref 30.0–36.0)
MCV: 84.1 fL (ref 80.0–100.0)
Platelets: 168 10*3/uL (ref 150–400)
RBC: 3.58 MIL/uL — ABNORMAL LOW (ref 4.22–5.81)
RDW: 18 % — ABNORMAL HIGH (ref 11.5–15.5)
WBC: 6.8 10*3/uL (ref 4.0–10.5)
nRBC: 0 % (ref 0.0–0.2)

## 2019-05-25 LAB — BASIC METABOLIC PANEL
Anion gap: 10 (ref 5–15)
BUN: 38 mg/dL — ABNORMAL HIGH (ref 6–20)
CO2: 24 mmol/L (ref 22–32)
Calcium: 8.1 mg/dL — ABNORMAL LOW (ref 8.9–10.3)
Chloride: 103 mmol/L (ref 98–111)
Creatinine, Ser: 2.65 mg/dL — ABNORMAL HIGH (ref 0.61–1.24)
GFR calc Af Amer: 29 mL/min — ABNORMAL LOW (ref >60)
GFR calc non Af Amer: 25 mL/min — ABNORMAL LOW (ref >60)
Glucose, Bld: 252 mg/dL — ABNORMAL HIGH (ref 70–99)
Potassium: 3.7 mmol/L (ref 3.5–5.1)
Sodium: 137 mmol/L (ref 135–145)

## 2019-05-25 LAB — GLUCOSE, CAPILLARY
Glucose-Capillary: 232 mg/dL — ABNORMAL HIGH (ref 70–99)
Glucose-Capillary: 189 mg/dL — ABNORMAL HIGH (ref 70–99)
Glucose-Capillary: 226 mg/dL — ABNORMAL HIGH (ref 70–99)
Glucose-Capillary: 242 mg/dL — ABNORMAL HIGH (ref 70–99)

## 2019-05-25 LAB — VANCOMYCIN, PEAK: Vancomycin Pk: 26 ug/mL — ABNORMAL LOW (ref 30–40)

## 2019-05-25 MED ORDER — VANCOMYCIN VARIABLE DOSE PER UNSTABLE RENAL FUNCTION (PHARMACIST DOSING): Status: DC

## 2019-05-25 MED ORDER — HYDRALAZINE HCL 20 MG/ML IJ SOLN: 10.0000 mg | INTRAMUSCULAR | Status: DC | PRN

## 2019-05-25 MED ORDER — INSULIN GLARGINE 100 UNIT/ML ~~LOC~~ SOLN
15.0000 [IU] | Freq: Every day | SUBCUTANEOUS | Status: DC
  Administered 2019-05-25 – 2019-05-27 (×3): 15 [IU] via SUBCUTANEOUS
  Filled 2019-05-25 (×3): qty 0.15

## 2019-05-25 NOTE — Progress Notes (Signed)
Clean patient wound with Normal saline. Patient states that he does not want dressing on his leg.

## 2019-05-25 NOTE — Plan of Care (Signed)
  Problem: Education: Goal: Knowledge of General Education information will improve Description: Including pain rating scale, medication(s)/side effects and non-pharmacologic comfort measures Outcome: Completed/Met

## 2019-05-25 NOTE — Progress Notes (Signed)
Patient ID: Gregory [name], male   DOB: 23-Jun-1960, 58 y.o.   MRN: [id number] Patient is a 58 year old gentleman with ulceration drainage and osteomyelitis right above-the-knee amputation.  Patient states that he is still unsure about proceeding with surgery.  I will call his daughter this morning discussed recommendation to proceed with revision of the above-knee amputation on Wednesday.

## 2019-05-25 NOTE — Consult Note (Signed)
[facility] Nurse Consult Note: Reason for Consult: initially Right BKA, revision to AKA with osteomyelitis and Dr [name] has recommended revision with AKA.  Patient is unsure how he would like to proceed.  Dr [name] is going to speak with patient's daughter regarding needed surgical intervention.   Wound type:surgical/infectious Pressure Injury POA: NA Measurement: surgical incision dehiscence with protruding bone and purulence.  Wound bed: Eschar and purulence, pale pink nongranulating Drainage (amount, consistency, odor) moderate purulence Periwound:edema Dressing procedure/placement/frequency: Cleanse right AKA site with NS and pat dry.  COver with Xeroform and dry gauze, wrap with kerlix and tape. Change daily.  Dr [name] managing this wound.  Will not follow at this time.  Please re-consult if needed.  Domenic Moras MSN, RN, FNP-BC CWON Wound, Ostomy, Continence Nurse Pager [phone]

## 2019-05-25 NOTE — Progress Notes (Signed)
Pharmacy Antibiotic Note  Gregory [name] is a 58 y.o. male admitted on 05/22/2019 with wound infection.  Pharmacy has been consulted for Vancomycin dosing. WBC elevated. Noted renal dysfunction. SCR 1.79>>2.65 Plan: Change vancomycin to variable dosing while renal function unstable. Get peak and 24 hour level in AM BMP in AM  F/U infectious work-up Drug levels as indicated   Height: 5\' 6"  (167.6 cm) Weight: 250 lb (113.4 kg) IBW/kg (Calculated) : 63.8  Temp (24hrs), Avg:98.7 F (37.1 C), Min:98 F (36.7 C), Max:99.6 F (37.6 C)  Recent Labs  Lab 05/22/19 1758 05/23/19 0116 05/24/19 0537 05/25/19 0538  WBC 21.9*  --  12.3* 6.8  CREATININE 1.79*  --   --  2.65*  LATICACIDVEN 2.1* 1.9  --   --     Estimated Creatinine Clearance: 35.5 mL/min (A) (by C-G formula based on SCr of 2.65 mg/dL (H)).    Allergies  Allergen Reactions  . Latex Other (See Comments)    Listed on MAR- pt sts he is not allergic.    Vicent Febles A. Levada Dy, PharmD, BCPS, FNKF Clinical Pharmacist [facility] Please utilize Amion for appropriate phone number to reach the unit pharmacist ([facility])

## 2019-05-25 NOTE — Progress Notes (Signed)
PROGRESS NOTE    Gregory [name]  [name] DOB: 11-12-1960 DOA: 05/22/2019 PCP: Gregory Marble, MD   Brief Narrative:  58 year old with history of right AKA with chronic stump infection, multifocal osteomyelitis, chronic pain syndrome, chronic diastolic congestive heart failure, CKD stage III, COPD, DM 2, HTN, HLD came to the hospital with complains of right leg pain with some drainage around that site.  Upon admission he was noted to be febrile with elevated WBC with concerns of infection of the right lower extremity stump.  X-ray was negative for any bony involvement.  Orthopedic, Dr. Lorin Mercy was consulted. Ortho plans for revision of right aka.    Assessment & Plan:   Principal Problem:   Amputation stump infection ([place]) Active Problems:   Type II diabetes mellitus with neurological manifestations ([place])   CKD (chronic kidney disease), stage III   Tobacco use   Sepsis ([place])   Sepsis secondary to infected right femoral stump/osteomyelitis/status post right AKA with chronic ulcer -Sepsis physiology.  Slowly improving.  Follow-up culture data.  No obvious wound dehiscence -Continue IV vancomycin and Rocephin.  Closely monitor renal function -Pain control bowel regimen -X-ray negative for bony involvement.   -Seen by orthopedic-recommending revision of above-knee amputation.  Will await their final decision.  [name] to discuss with his daughter.  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  Supportive care.  Acute kidney injury on CKD stage IIIb -Creatinine baseline at 1.7.  Creatinine trended up to 2.65.  Diabetes mellitus type 2, poorly controlled secondary to hyperglycemia -Insulin sliding scale and Accu-Chek -A1c-8.6  Essential hypertension -Norvasc 10 mg daily.  Metoprolol 50 mg daily.  Chronic diastolic congestive heart failure -Clinically appears to be euvolemic.  Does have chronic lower extremity edema.  Anemia of chronic  disease -Baseline hemoglobin around 11.  No obvious evidence of bleeding. Cont Iron Supplements.   HLD= Cont Lipitor.   DVT prophylaxis: Lovenox Code Status: Full code Family Communication: None Disposition Plan: Maintain hospital stay for IV antibiotics.  Eventually she will need revision of the right AKA ulceration with concerns of osteomyelitis  Consultants:   Orthopedic  Procedures:   None   Antimicrobials:   IV Vanc, Roc   Subjective: Reasonably upset that he still requires immobilization of right AKA described on for multiple surgeries in the past.  He understands the risk limited options at this time.  He would like orthopedic to discuss with her daughter prior to proceeding.  Review of Systems Otherwise negative except as per HPI, including: General = no fevers, chills, dizziness, malaise, fatigue HEENT/EYES = negative for pain, redness, loss of vision, double vision, blurred vision, loss of hearing, sore throat, hoarseness, dysphagia Cardiovascular= negative for chest pain, palpitation, murmurs, lower extremity swelling Respiratory/lungs= negative for shortness of breath, cough, hemoptysis, wheezing, mucus production Gastrointestinal= negative for nausea, vomiting,, abdominal pain, melena, hematemesis Genitourinary= negative for Dysuria, Hematuria, Change in Urinary Frequency MSK = Negative for arthralgia, myalgias, Back Pain, Joint swelling  Neurology= Negative for headache, seizures, numbness, tingling  Psychiatry= Negative for anxiety, depression, suicidal and homocidal ideation Allergy/Immunology= Medication/Food allergy as listed  Skin= Negative for Rash, lesions, ulcers, itching    Objective: Vitals:   05/24/19 1700 05/24/19 2050 05/25/19 0537 05/25/19 0828  BP: 128/66 123/65 125/68 126/72  Pulse: 90 71 80 72  Resp: 20 18 18 16   Temp: 98.8 F (37.1 C) 98.3 F (36.8 C) 98.7 F (37.1 C) 98 F (36.7 C)  TempSrc: Oral Oral Oral  Oral  SpO2: 96% 95% 95%  93%  Weight:      Height:        Intake/Output Summary (Last 24 hours) at 05/25/2019 1153 Last data filed at 05/25/2019 1130 Gross per 24 hour  Intake 1671.57 ml  Output 1975 ml  Net -303.43 ml   Filed Weights   05/22/19 1701  Weight: 113.4 kg    Examination: Constitutional: Not in acute distress Respiratory: Clear to auscultation bilaterally Cardiovascular: Normal sinus rhythm, no rubs Abdomen: Nontender nondistended good bowel sounds Musculoskeletal: No edema noted, right-sided AKA Skin: Right sided AKA with drainage noted around the incision scar from the past with some drainage.  Slightly warm to touch.  Chronic left lower extremity skin changes. Neurologic: CN 2-12 grossly intact.  And nonfocal Psychiatric: Normal judgment and insight. Alert and oriented x 3. Normal mood.      Data Reviewed:   CBC: Recent Labs  Lab 05/22/19 1758 05/24/19 0537 05/25/19 0538  WBC 21.9* 12.3* 6.8  NEUTROABS 20.6*  --   --   HGB 11.8* 10.8* 9.2*  HCT 37.8* 35.4* 30.1*  MCV 83.4 85.3 84.1  PLT 256 175 [id number]   Basic Metabolic Panel: Recent Labs  Lab 05/22/19 1758 05/25/19 0538  NA 135 137  K 4.4 3.7  CL 100 103  CO2 23 24  GLUCOSE 209* 252*  BUN 21* 38*  CREATININE 1.79* 2.65*  CALCIUM 9.0 8.1*   GFR: Estimated Creatinine Clearance: 35.5 mL/min (A) (by C-G formula based on SCr of 2.65 mg/dL (H)). Liver Function Tests: Recent Labs  Lab 05/22/19 1758  AST 13*  ALT 12  ALKPHOS 90  BILITOT 0.5  PROT 7.4  ALBUMIN 3.3*   No results for input(s): LIPASE, AMYLASE in the last 168 hours. No results for input(s): AMMONIA in the last 168 hours. Coagulation Profile: No results for input(s): INR, PROTIME in the last 168 hours. Cardiac Enzymes: No results for input(s): CKTOTAL, CKMB, CKMBINDEX, TROPONINI in the last 168 hours. BNP (last 3 results) No results for input(s): PROBNP in the last 8760 hours. HbA1C: Recent Labs    05/23/19 0609  HGBA1C 8.6*   CBG: Recent  Labs  Lab 05/24/19 1145 05/24/19 1638 05/24/19 2147 05/25/19 0712 05/25/19 1133  GLUCAP 167* 213* 165* 232* 189*   Lipid Profile: No results for input(s): CHOL, HDL, LDLCALC, TRIG, CHOLHDL, LDLDIRECT in the last 72 hours. Thyroid Function Tests: No results for input(s): TSH, T4TOTAL, FREET4, T3FREE, THYROIDAB in the last 72 hours. Anemia Panel: No results for input(s): VITAMINB12, FOLATE, FERRITIN, TIBC, IRON, RETICCTPCT in the last 72 hours. Sepsis Labs: Recent Labs  Lab 05/22/19 1758 05/23/19 0116  LATICACIDVEN 2.1* 1.9    Recent Results (from the past 240 hour(s))  Culture, blood (Routine x 2)     Status: None (Preliminary result)   Collection Time: 05/22/19 10:00 PM   Specimen: BLOOD  Result Value Ref Range Status   Specimen Description BLOOD LEFT ARM  Final   Special Requests   Final    BOTTLES DRAWN AEROBIC AND ANAEROBIC Blood Culture adequate volume   Culture   Final    NO GROWTH 1 DAY Performed at [facility], [place] [address] [place], [place] [address]    Report Status PENDING  Incomplete  Culture, blood (Routine x 2)     Status: None (Preliminary result)   Collection Time: 05/22/19 10:30 PM   Specimen: BLOOD  Result Value Ref Range Status   Specimen Description BLOOD RIGHT ARM  Final   Special Requests   Final    BOTTLES DRAWN AEROBIC ONLY Blood Culture adequate volume   Culture   Final    NO GROWTH 1 DAY Performed at [facility], [place] [address], [place], [place] [address]    Report Status PENDING  Incomplete  SARS CORONAVIRUS 2 (TAT 6-24 HRS) Nasopharyngeal Nasopharyngeal Swab     Status: None   Collection Time: 05/23/19 12:56 AM   Specimen: Nasopharyngeal Swab  Result Value Ref Range Status   SARS Coronavirus 2 NEGATIVE NEGATIVE Final    Comment: (NOTE) SARS-CoV-2 target nucleic acids are NOT DETECTED. The SARS-CoV-2 RNA is generally detectable in upper and lower respiratory specimens during the acute phase of infection. Negative  results do not preclude SARS-CoV-2 infection, do not rule out co-infections with other pathogens, and should not be used as the sole basis for treatment or other patient management decisions. Negative results must be combined with clinical observations, patient history, and epidemiological information. The expected result is Negative. Fact Sheet for Patients: SugarRoll.be Fact Sheet for Healthcare Providers: https://www.woods-mathews.com/ This test is not yet approved or cleared by the [place] FDA and  has been authorized for detection and/or diagnosis of SARS-CoV-2 by FDA under an Emergency Use Authorization (EUA). This EUA will remain  in effect (meaning this test can be used) for the duration of the COVID-19 declaration under Section 56 4(b)(1) of the Act, 21 U.S.C. section 360bbb-3(b)(1), unless the authorization is terminated or revoked sooner. Performed at [facility], [place] [address], [place], [place] [address]   Urine culture     Status: Abnormal   Collection Time: 05/23/19 12:56 AM   Specimen: In/Out Cath Urine  Result Value Ref Range Status   Specimen Description IN/OUT CATH URINE  Final   Special Requests   Final    NONE Performed at [facility], [place] [address] [place], [place] [address]    Culture MULTIPLE SPECIES PRESENT, SUGGEST RECOLLECTION (A)  Final   Report Status 05/24/2019 FINAL  Final         Radiology Studies: No results found.      Scheduled Meds: . amLODipine  10 mg Oral Daily  . atorvastatin  20 mg Oral QHS  . buPROPion  300 mg Oral QHS  . enoxaparin (LOVENOX) injection  40 mg Subcutaneous Q24H  . ferrous sulfate  325 mg Oral Q breakfast  . finasteride  5 mg Oral Daily  . insulin aspart  0-5 Units Subcutaneous QHS  . insulin aspart  0-9 Units Subcutaneous TID WC  . metoprolol tartrate  50 mg Oral Daily  . mirabegron ER  50 mg Oral Daily  . nicotine  14 mg Transdermal Daily  .  oxybutynin  15 mg Oral QHS  . oxymetazoline  2 spray Each Nare BID  . pantoprazole  40 mg Oral Daily  . PARoxetine  10 mg Oral QHS  . pregabalin  150 mg Oral BID  . senna-docusate  2 tablet Oral QHS  . tamsulosin  0.8 mg Oral QPM  . terbinafine  250 mg Oral Daily  . umeclidinium bromide  1 puff Inhalation Daily  . vancomycin variable dose per unstable renal function (pharmacist dosing)   Does not apply See admin instructions  . vitamin B-12  1,000 mcg Oral Daily   Continuous Infusions: . sodium chloride 250 mL (05/24/19 0551)  . cefTRIAXone (ROCEPHIN)  IV 2 g (05/24/19 2139)     LOS: 2 days  Time spent=40 mins    Tinslee Klare Arsenio Loader, MD [facility]  If 7PM-7AM, please contact night-coverage  05/25/2019, 11:53 AM

## 2019-05-25 NOTE — Progress Notes (Signed)
Inpatient Diabetes Program Recommendations  AACE/ADA: New Consensus Statement on Inpatient Glycemic Control (2015)  Target Ranges:  Prepandial:   less than 140 mg/dL      Peak postprandial:   less than 180 mg/dL (1-2 hours)      Critically ill patients:  140 - 180 mg/dL   Lab Results  Component Value Date   GLUCAP 189 (H) 05/25/2019   HGBA1C 8.6 (H) 05/23/2019    Review of Glycemic Control Results for EZECHIEL, BENSING (MRN [id number]) as of 05/25/2019 13:38  Ref. Range 05/24/2019 11:45 05/24/2019 16:38 05/24/2019 21:47 05/25/2019 07:12 05/25/2019 11:33  Glucose-Capillary Latest Ref Range: 70 - 99 mg/dL 167 (H) 213 (H) 165 (H) 232 (H) 189 (H)   Diabetes history: DM 2 Outpatient Diabetes medications:  Glucotrol 10 mg daily, Humalog 0-10 units tid Current orders for Inpatient glycemic control:  Novolog sensitive tid with meals and HS  Inpatient Diabetes Program Recommendations:    Consider adding Lantus 15 units daily.   Thanks,  Adah Perl, RN, BC-ADM Inpatient Diabetes Coordinator Pager [phone] (8a-5p)

## 2019-05-26 DIAGNOSIS — E118 Type 2 diabetes mellitus with unspecified complications: Secondary | ICD-10-CM

## 2019-05-26 DIAGNOSIS — N183 Chronic kidney disease, stage 3 unspecified: Secondary | ICD-10-CM

## 2019-05-26 DIAGNOSIS — Z89412 Acquired absence of left great toe: Secondary | ICD-10-CM

## 2019-05-26 DIAGNOSIS — E1122 Type 2 diabetes mellitus with diabetic chronic kidney disease: Secondary | ICD-10-CM

## 2019-05-26 DIAGNOSIS — Z89422 Acquired absence of other left toe(s): Secondary | ICD-10-CM

## 2019-05-26 DIAGNOSIS — Z8739 Personal history of other diseases of the musculoskeletal system and connective tissue: Secondary | ICD-10-CM

## 2019-05-26 DIAGNOSIS — Z8701 Personal history of pneumonia (recurrent): Secondary | ICD-10-CM

## 2019-05-26 DIAGNOSIS — I130 Hypertensive heart and chronic kidney disease with heart failure and stage 1 through stage 4 chronic kidney disease, or unspecified chronic kidney disease: Secondary | ICD-10-CM

## 2019-05-26 DIAGNOSIS — I5032 Chronic diastolic (congestive) heart failure: Secondary | ICD-10-CM

## 2019-05-26 DIAGNOSIS — F17210 Nicotine dependence, cigarettes, uncomplicated: Secondary | ICD-10-CM

## 2019-05-26 LAB — GLUCOSE, CAPILLARY
Glucose-Capillary: 178 mg/dL — ABNORMAL HIGH (ref 70–99)
Glucose-Capillary: 196 mg/dL — ABNORMAL HIGH (ref 70–99)
Glucose-Capillary: 231 mg/dL — ABNORMAL HIGH (ref 70–99)
Glucose-Capillary: 236 mg/dL — ABNORMAL HIGH (ref 70–99)

## 2019-05-26 LAB — BASIC METABOLIC PANEL
Anion gap: 12 (ref 5–15)
BUN: 36 mg/dL — ABNORMAL HIGH (ref 6–20)
CO2: 25 mmol/L (ref 22–32)
Calcium: 8.7 mg/dL — ABNORMAL LOW (ref 8.9–10.3)
Chloride: 99 mmol/L (ref 98–111)
Creatinine, Ser: 2.11 mg/dL — ABNORMAL HIGH (ref 0.61–1.24)
GFR calc Af Amer: 39 mL/min — ABNORMAL LOW (ref >60)
GFR calc non Af Amer: 33 mL/min — ABNORMAL LOW (ref >60)
Glucose, Bld: 218 mg/dL — ABNORMAL HIGH (ref 70–99)
Potassium: 3.8 mmol/L (ref 3.5–5.1)
Sodium: 136 mmol/L (ref 135–145)

## 2019-05-26 LAB — CBC
HCT: 32.7 % — ABNORMAL LOW (ref 39.0–52.0)
Hemoglobin: 10.1 g/dL — ABNORMAL LOW (ref 13.0–17.0)
MCH: 25.9 pg — ABNORMAL LOW (ref 26.0–34.0)
MCHC: 30.9 g/dL (ref 30.0–36.0)
MCV: 83.8 fL (ref 80.0–100.0)
Platelets: 211 10*3/uL (ref 150–400)
RBC: 3.9 MIL/uL — ABNORMAL LOW (ref 4.22–5.81)
RDW: 17.6 % — ABNORMAL HIGH (ref 11.5–15.5)
WBC: 6.1 10*3/uL (ref 4.0–10.5)
nRBC: 0 % (ref 0.0–0.2)

## 2019-05-26 LAB — MRSA PCR SCREENING: MRSA by PCR: POSITIVE — AB

## 2019-05-26 LAB — VANCOMYCIN, RANDOM: Vancomycin Rm: 16

## 2019-05-26 LAB — MAGNESIUM: Magnesium: 1.7 mg/dL (ref 1.7–2.4)

## 2019-05-26 MED ORDER — CHLORHEXIDINE GLUCONATE CLOTH 2 % EX PADS
6.0000 | MEDICATED_PAD | Freq: Every day | CUTANEOUS | Status: AC
  Administered 2019-05-26 – 2019-05-30 (×3): 6 via TOPICAL

## 2019-05-26 MED ORDER — VANCOMYCIN HCL IN DEXTROSE 1-5 GM/200ML-% IV SOLN
1000.0000 mg | INTRAVENOUS | Status: DC
  Filled 2019-05-26: qty 200

## 2019-05-26 MED ORDER — VANCOMYCIN HCL 10 G IV SOLR
1250.0000 mg | INTRAVENOUS | Status: DC
  Administered 2019-05-27 – 2019-05-31 (×4): 1250 mg via INTRAVENOUS
  Filled 2019-05-26 (×5): qty 1250

## 2019-05-26 MED ORDER — MUPIROCIN 2 % EX OINT
1.0000 "application " | TOPICAL_OINTMENT | Freq: Two times a day (BID) | CUTANEOUS | Status: AC
  Administered 2019-05-26 – 2019-05-30 (×10): 1 via NASAL
  Filled 2019-05-26 (×2): qty 22

## 2019-05-26 MED ORDER — ALPRAZOLAM 0.5 MG PO TABS
0.5000 mg | ORAL_TABLET | Freq: Three times a day (TID) | ORAL | Status: DC | PRN
  Administered 2019-05-26 – 2019-05-31 (×11): 0.5 mg via ORAL
  Filled 2019-05-26 (×11): qty 1

## 2019-05-26 NOTE — Consult Note (Signed)
Gregory [name] for Infectious Disease    Date of Admission:  05/22/2019     Total days of antibiotics 5             Reason for Consult: Osteomyelitis  Referring Provider: Reesa Chew Primary Care Provider: [name], MD   ASSESSMENT:  Gregory [name] is a 58 y/o male with osteomyelitis of his right above knee amputation with poorly controlled Type 2 diabetes. Orthopedics has recommended revision of right above knee amputation which he is currently declining. There is no culture data that is available from previous infections and his blood cultures are without growth to date. Has defeversced with improved leukocytosis with broad spectrum coverage with vancomycin and ceftraixone.  Discussed concern this is not likely to heal with antibiotics as debridement would be necessary for source control. Would need prolonged therapy course of at least 6 weeks of IV antibiotics should he decide to proceed with treatment although unlikely to have significant improvements with this plan. He is agreeable to obtain MRI for further evaluation and will consider surgery pending results of MRI. We discussed importance of blood sugar control to improve his healing.    PLAN:  1. Continue vancomycin and ceftraixone. 2. Will check inflammatory markers. 3. MRI of the right femur 4. Check Hepatitis C antibody for healthcare maintenance  5. Blood sugar control per primary team.    Principal Problem:   Amputation stump infection ([place]) Active Problems:   Type II diabetes mellitus with neurological manifestations ([place])   CKD (chronic kidney disease), stage III   Tobacco use   Sepsis ([place])   . amLODipine  10 mg Oral Daily  . atorvastatin  20 mg Oral QHS  . buPROPion  300 mg Oral QHS  . enoxaparin (LOVENOX) injection  40 mg Subcutaneous Q24H  . ferrous sulfate  325 mg Oral Q breakfast  . finasteride  5 mg Oral Daily  . insulin aspart  0-5 Units Subcutaneous QHS  . insulin aspart  0-9 Units Subcutaneous  TID WC  . insulin glargine  15 Units Subcutaneous Daily  . metoprolol tartrate  50 mg Oral Daily  . mirabegron ER  50 mg Oral Daily  . nicotine  14 mg Transdermal Daily  . oxybutynin  15 mg Oral QHS  . oxymetazoline  2 spray Each Nare BID  . pantoprazole  40 mg Oral Daily  . PARoxetine  10 mg Oral QHS  . pregabalin  150 mg Oral BID  . senna-docusate  2 tablet Oral QHS  . tamsulosin  0.8 mg Oral QPM  . terbinafine  250 mg Oral Daily  . umeclidinium bromide  1 puff Inhalation Daily  . vitamin B-12  1,000 mcg Oral Daily     HPI: Gregory [name] is a 58 y.o. male with previous medical history of type 2 diabetes, chronic kidney disease stage III, chronic diastolic congestive heart failure, hypertension, and osteomyelitis s/p left great toe (2018) and 2nd toe amputation (2019) and most recently right below knee amputation (5/20) followed by right above knee amputation in June admitted with right stump pain unrelieved with pain medication.  Gregory [name] was febrile at home and on admission with max temperature of 102.6 F with leukocytosis of 21.9 and lactic acid of 2.1. Stump site with some purulent drainage. X-ray without acute osseous abnormality. Started on broad spectrum vancomycin and ceftriaxone.  Previously tested positive for MRSA PCR swab.   Orthopedics consulted and with evaluation showing 3 cm ulcer in diameter  with exposed bone and purulent drainage. Recommendation to proceed with revision of above knee amputation.   Gregory [name] has been afebrile since admission and is currently on Day 5 of antimicrobial therapy with Vancomycin and ceftriaxone. Blood cultures on admission are without growth to date and leukocytosis is improved now at 6.1. Per notes, Gregory [name] is not wishing to pursue surgery at the present time and wishing to continue with antibiotic therapy while possibly considering surgery. ID has been consulted for antibiotic management.   Gregory [name] continues to have pain in his right stump  with redness and swelling around the area. Describes the pain as lightening like at times and throbbing. He started having fevers and symptoms about 4 days ago. Was recently on antibiotics for pneumonia about 2 weeks ago. He is currently at [facility].   Review of Systems: Review of Systems  Constitutional: Negative for chills, fever and weight loss.  Respiratory: Negative for cough, shortness of breath and wheezing.   Cardiovascular: Negative for chest pain and leg swelling.  Gastrointestinal: Negative for abdominal pain, constipation, diarrhea, nausea and vomiting.  Musculoskeletal:       Positive for right stump pain.   Skin: Negative for rash.     Past Medical History:  Diagnosis Date  . Acquired absence of left great toe ([place])   . Acquired absence of right leg below knee ([place])   . Acute osteomyelitis of calcaneum, right ([place]) 10/02/2016  . Acute respiratory failure ([place])   . Amputation stump infection ([place]) 08/12/2018  . Anemia   . Basal cell carcinoma, eyelid   . Cancer ([place])   . Candidiasis 08/12/2018  . CHF (congestive heart failure) ([facility])    chronic diastolic   . Chronic back pain   . Chronic kidney disease    stage 3   . Chronic multifocal osteomyelitis ([facility])    of ankle and foot   . Chronic pain syndrome   . COPD (chronic obstructive pulmonary disease) ([place])   . DDD (degenerative disc disease), lumbar   . Depression    major depressive disorder   . Diabetes mellitus without complication ([place])    type 2   . Diabetic ulcer of toe of left foot ([place]) 10/02/2016  . Difficulty in walking, not elsewhere classified   . 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 Foot drop, bilateral   . Foot drop, right 12/27/2015  . GERD (gastroesophageal reflux disease)   . Herpesviral vesicular dermatitis   . Hyperlipidemia   . Hyperlipidemia   . Hypertension   . Insomnia   . 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 MRSA bacteremia   . Muscle weakness (generalized)   . Nasal  congestion   . Necrosis of toe ([place]) 07/08/2018  . Neuromuscular dysfunction of bladder   . Osteomyelitis ([place])   . Osteomyelitis ([place])   . Osteomyelitis ([place])    right BKA  . Other idiopathic peripheral autonomic neuropathy   . Retention of urine, unspecified   . Squamous cell carcinoma of skin   . Unsteadiness on feet   . Urinary retention   . Urinary retention   . Urinary tract infection   . Wears dentures   . Wears glasses     Social History   Tobacco Use  . Smoking status: Current Every Day Smoker    Packs/day: 0.50    Types: Cigarettes  . Smokeless tobacco: Never Used  . Tobacco comment: 11/19/18  Substance Use Topics  . Alcohol use: No  .  Drug use: No    Family History  Problem Relation Age of Onset  . Cancer Other   . Diabetes Other     Allergies  Allergen Reactions  . Latex Other (See Comments)    Listed on MAR- pt sts he is not allergic.    OBJECTIVE: Blood pressure (!) 143/73, pulse 63, temperature 98.1 F (36.7 C), temperature source Oral, resp. rate 18, height 5\' 6"  (1.676 m), weight 110.9 kg, SpO2 95 %.  Physical Exam Constitutional:      General: He is not in acute distress.    Appearance: He is well-developed. He is obese.     Comments: Lying in bed with head of bed elevated; pleasant.   Cardiovascular:     Rate and Rhythm: Normal rate and regular rhythm.     Heart sounds: Normal heart sounds.  Pulmonary:     Effort: Pulmonary effort is normal.     Breath sounds: Normal breath sounds. No wheezing or rhonchi.  Abdominal:     General: Bowel sounds are normal.     Tenderness: There is no abdominal tenderness.  Musculoskeletal:     Comments: Right stump with softball sized area of redness at the base with purulent discharge in the center. There is tenderness without induration, but does feel tight.   Skin:    General: Skin is warm and dry.  Neurological:     Mental Status: He is alert and oriented to person, place, and time.  Psychiatric:         Behavior: Behavior normal.        Thought Content: Thought content normal.        Judgment: Judgment normal.     Lab Results Lab Results  Component Value Date   WBC 6.1 05/26/2019   HGB 10.1 (L) 05/26/2019   HCT 32.7 (L) 05/26/2019   MCV 83.8 05/26/2019   PLT 211 05/26/2019    Lab Results  Component Value Date   CREATININE 2.11 (H) 05/26/2019   BUN 36 (H) 05/26/2019   NA 136 05/26/2019   K 3.8 05/26/2019   CL 99 05/26/2019   CO2 25 05/26/2019    Lab Results  Component Value Date   ALT 12 05/22/2019   AST 13 (L) 05/22/2019   ALKPHOS 90 05/22/2019   BILITOT 0.5 05/22/2019     Microbiology: Recent Results (from the past 240 hour(s))  Culture, blood (Routine x 2)     Status: None (Preliminary result)   Collection Time: 05/22/19 10:00 PM   Specimen: BLOOD  Result Value Ref Range Status   Specimen Description BLOOD LEFT ARM  Final   Special Requests   Final    BOTTLES DRAWN AEROBIC AND ANAEROBIC Blood Culture adequate volume   Culture   Final    NO GROWTH 3 DAYS Performed at [facility], [address], [place], [place] [address]    Report Status PENDING  Incomplete  Culture, blood (Routine x 2)     Status: None (Preliminary result)   Collection Time: 05/22/19 10:30 PM   Specimen: BLOOD  Result Value Ref Range Status   Specimen Description BLOOD RIGHT ARM  Final   Special Requests   Final    BOTTLES DRAWN AEROBIC ONLY Blood Culture adequate volume   Culture   Final    NO GROWTH 3 DAYS Performed at [facility], [place] [address], [place], [place] [address]    Report Status PENDING  Incomplete  SARS CORONAVIRUS 2 (TAT  6-24 HRS) Nasopharyngeal Nasopharyngeal Swab     Status: None   Collection Time: 05/23/19 12:56 AM   Specimen: Nasopharyngeal Swab  Result Value Ref Range Status   SARS Coronavirus 2 NEGATIVE NEGATIVE Final    Comment: (NOTE) SARS-CoV-2 target nucleic acids are NOT DETECTED. The SARS-CoV-2 RNA is generally detectable in upper and  lower respiratory specimens during the acute phase of infection. Negative results do not preclude SARS-CoV-2 infection, do not rule out co-infections with other pathogens, and should not be used as the sole basis for treatment or other patient management decisions. Negative results must be combined with clinical observations, patient history, and epidemiological information. The expected result is Negative. Fact Sheet for Patients: SugarRoll.be Fact Sheet for Healthcare Providers: https://www.woods-mathews.com/ This test is not yet approved or cleared by the [place] FDA and  has been authorized for detection and/or diagnosis of SARS-CoV-2 by FDA under an Emergency Use Authorization (EUA). This EUA will remain  in effect (meaning this test can be used) for the duration of the COVID-19 declaration under Section 56 4(b)(1) of the Act, 21 U.S.C. section 360bbb-3(b)(1), unless the authorization is terminated or revoked sooner. Performed at [facility], [place] [address] [place], [place] [address]   Urine culture     Status: Abnormal   Collection Time: 05/23/19 12:56 AM   Specimen: In/Out Cath Urine  Result Value Ref Range Status   Specimen Description IN/OUT CATH URINE  Final   Special Requests   Final    NONE Performed at [facility], [place] [address], [place], [place] [address]    Culture MULTIPLE SPECIES PRESENT, SUGGEST RECOLLECTION (A)  Final   Report Status 05/24/2019 FINAL  Final     Terri Piedra, NP [facility] [place] [phone] Pager  05/26/2019  9:42 AM

## 2019-05-26 NOTE — Progress Notes (Signed)
Patient refused to wear his telemetry.

## 2019-05-26 NOTE — Progress Notes (Signed)
Patient non compliant with diet . "I am tired of drinking diet. My daughter let me dink some." Patient also was agitated and hateful to staff. Anti anxiety meds given.

## 2019-05-26 NOTE — Progress Notes (Addendum)
PROGRESS NOTE    Gregory [name]  [name] DOB: Apr 14, 1961 DOA: 05/22/2019 PCP: [name], MD   Brief Narrative:  58 year old with history of right AKA with chronic stump infection, multifocal osteomyelitis, chronic pain syndrome, chronic diastolic congestive heart failure, CKD stage III, COPD, DM 2, HTN, HLD came to the hospital with complains of right leg pain with some drainage around that site.  Upon admission he was noted to be febrile with elevated WBC with concerns of infection of the right lower extremity stump.  X-ray was negative for any bony involvement.  Orthopedic, Dr. Lorin Mercy was consulted. Ortho plans for revision of right aka.  Hesitant about surgical intervention.  ID consulted for antibiotic management   Assessment & Plan:   Principal Problem:   Amputation stump infection ([place]) Active Problems:   Type II diabetes mellitus with neurological manifestations ([place])   CKD (chronic kidney disease), stage III   Tobacco use   Sepsis ([place])   Sepsis secondary to infected right femoral stump/osteomyelitis/status post right AKA with chronic ulcer -Sepsis physiology.  Slowly improving.  Follow-up culture data.  No obvious wound dehiscence.  Seen by orthopedic recommending revision of AKA.  Patient is hesitant for repeat surgery.  Understands limited options.  Does not want MRI either. -Continue IV vancomycin and Rocephin.  Closely monitor renal function -Pain control, bowel regimen -ID consulted for their input regarding antibiotic management. -Has been refusing MRI which was previously offered  Patient requesting second opinion - Spoke with Dr Marla Roe, Plastic Surgery, she has gracefully agreed to give her input.   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  Supportive care.  Acute kidney injury on CKD stage IIIb -Creatinine baseline at 1.7.  Creatinine trended up to 2.65.  Diabetes mellitus type 2, poorly controlled  secondary to hyperglycemia -Insulin sliding scale and Accu-Chek -A1c-8.6  Essential hypertension -Norvasc 10 mg daily.  Metoprolol 50 mg daily.  Chronic diastolic congestive heart failure -Clinically appears to be euvolemic.  Does have chronic lower extremity edema.  Anemia of chronic disease -Baseline hemoglobin around 11.  No obvious evidence of bleeding. Cont Iron Supplements.   HLD= Cont Lipitor.   DVT prophylaxis: Lovenox Code Status: Full code Family Communication: None Disposition Plan: Maintain hospital stay for IV antibiotics.  At this time he will think about possible surgery for Friday.  Consultants:   Orthopedic  Infectious disease  Procedures:   None   Antimicrobials:   IV Vanc, Roc   Subjective: Reports of right lower extremity discomfort around the stump site.  Upset that he has to go to another surgical intervention and at this time he is not sure if would like to go through this again.  Understands limited options.  Review of Systems Otherwise negative except as per HPI, including: General = no fevers, chills, dizziness, malaise, fatigue HEENT/EYES = negative for pain, redness, loss of vision, double vision, blurred vision, loss of hearing, sore throat, hoarseness, dysphagia Cardiovascular= negative for chest pain, palpitation, murmurs, lower extremity swelling Respiratory/lungs= negative for shortness of breath, cough, hemoptysis, wheezing, mucus production Gastrointestinal= negative for nausea, vomiting,, abdominal pain, melena, hematemesis Genitourinary= negative for Dysuria, Hematuria, Change in Urinary Frequency MSK = Negative for arthralgia, myalgias, Back Pain, Joint swelling  Neurology= Negative for headache, seizures, numbness, tingling  Psychiatry= Negative for anxiety, depression, suicidal and homocidal ideation Allergy/Immunology= Medication/Food allergy as listed  Skin= Negative for Rash, lesions, ulcers, itching    Objective:  Vitals:   05/25/19 2050 05/25/19 2051  05/26/19 0426 05/26/19 0820  BP: 121/75 121/75 137/68 (!) 143/73  Pulse: 70 68 67 63  Resp: 16 16 16 18   Temp: 98.1 F (36.7 C) 98.1 F (36.7 C) 98.1 F (36.7 C) 98.1 F (36.7 C)  TempSrc: Oral Oral Oral Oral  SpO2: 91% 92% 95% 95%  Weight:   110.9 kg   Height:        Intake/Output Summary (Last 24 hours) at 05/26/2019 1129 Last data filed at 05/26/2019 0800 Gross per 24 hour  Intake 1961.86 ml  Output 2025 ml  Net -63.14 ml   Filed Weights   05/22/19 1701 05/26/19 0426  Weight: 113.4 kg 110.9 kg    Examination: Constitutional: Not in acute distress Respiratory: Clear to auscultation bilaterally Cardiovascular: Normal sinus rhythm, no rubs Abdomen: Nontender nondistended good bowel sounds Musculoskeletal: No edema noted, right sided AKA Skin: Right lower extremity AKA noted some drainage around the previous incision scar.  Left lower extremity chronic skin changes noted. Neurologic: CN 2-12 grossly intact.  And nonfocal Psychiatric: Normal judgment and insight. Alert and oriented x 3. Normal mood.     Data Reviewed:   CBC: Recent Labs  Lab 05/22/19 1758 05/24/19 0537 05/25/19 0538 05/26/19 0602  WBC 21.9* 12.3* 6.8 6.1  NEUTROABS 20.6*  --   --   --   HGB 11.8* 10.8* 9.2* 10.1*  HCT 37.8* 35.4* 30.1* 32.7*  MCV 83.4 85.3 84.1 83.8  PLT 256 175 168 [id number]   Basic Metabolic Panel: Recent Labs  Lab 05/22/19 1758 05/25/19 0538 05/26/19 0602  NA 135 137 136  K 4.4 3.7 3.8  CL 100 103 99  CO2 23 24 25   GLUCOSE 209* 252* 218*  BUN 21* 38* 36*  CREATININE 1.79* 2.65* 2.11*  CALCIUM 9.0 8.1* 8.7*  MG  --   --  1.7   GFR: Estimated Creatinine Clearance: 44 mL/min (A) (by C-G formula based on SCr of 2.11 mg/dL (H)). Liver Function Tests: Recent Labs  Lab 05/22/19 1758  AST 13*  ALT 12  ALKPHOS 90  BILITOT 0.5  PROT 7.4  ALBUMIN 3.3*   No results for input(s): LIPASE, AMYLASE in the last 168 hours. No results  for input(s): AMMONIA in the last 168 hours. Coagulation Profile: No results for input(s): INR, PROTIME in the last 168 hours. Cardiac Enzymes: No results for input(s): CKTOTAL, CKMB, CKMBINDEX, TROPONINI in the last 168 hours. BNP (last 3 results) No results for input(s): PROBNP in the last 8760 hours. HbA1C: No results for input(s): HGBA1C in the last 72 hours. CBG: Recent Labs  Lab 05/25/19 1133 05/25/19 1611 05/25/19 2049 05/26/19 0655 05/26/19 1127  GLUCAP 189* 226* 242* 178* 196*   Lipid Profile: No results for input(s): CHOL, HDL, LDLCALC, TRIG, CHOLHDL, LDLDIRECT in the last 72 hours. Thyroid Function Tests: No results for input(s): TSH, T4TOTAL, FREET4, T3FREE, THYROIDAB in the last 72 hours. Anemia Panel: No results for input(s): VITAMINB12, FOLATE, FERRITIN, TIBC, IRON, RETICCTPCT in the last 72 hours. Sepsis Labs: Recent Labs  Lab 05/22/19 1758 05/23/19 0116  LATICACIDVEN 2.1* 1.9    Recent Results (from the past 240 hour(s))  Culture, blood (Routine x 2)     Status: None (Preliminary result)   Collection Time: 05/22/19 10:00 PM   Specimen: BLOOD  Result Value Ref Range Status   Specimen Description BLOOD LEFT ARM  Final   Special Requests   Final    BOTTLES DRAWN AEROBIC AND ANAEROBIC Blood Culture adequate volume   Culture  Final    NO GROWTH 3 DAYS Performed at [facility], [place] [address] [place], [place] [address]    Report Status PENDING  Incomplete  Culture, blood (Routine x 2)     Status: None (Preliminary result)   Collection Time: 05/22/19 10:30 PM   Specimen: BLOOD  Result Value Ref Range Status   Specimen Description BLOOD RIGHT ARM  Final   Special Requests   Final    BOTTLES DRAWN AEROBIC ONLY Blood Culture adequate volume   Culture   Final    NO GROWTH 3 DAYS Performed at [facility], [place] [address], [place], [place] [address]    Report Status PENDING  Incomplete  SARS CORONAVIRUS 2 (TAT 6-24 HRS) Nasopharyngeal  Nasopharyngeal Swab     Status: None   Collection Time: 05/23/19 12:56 AM   Specimen: Nasopharyngeal Swab  Result Value Ref Range Status   SARS Coronavirus 2 NEGATIVE NEGATIVE Final    Comment: (NOTE) SARS-CoV-2 target nucleic acids are NOT DETECTED. The SARS-CoV-2 RNA is generally detectable in upper and lower respiratory specimens during the acute phase of infection. Negative results do not preclude SARS-CoV-2 infection, do not rule out co-infections with other pathogens, and should not be used as the sole basis for treatment or other patient management decisions. Negative results must be combined with clinical observations, patient history, and epidemiological information. The expected result is Negative. Fact Sheet for Patients: SugarRoll.be Fact Sheet for Healthcare Providers: https://www.woods-mathews.com/ This test is not yet approved or cleared by the [place] FDA and  has been authorized for detection and/or diagnosis of SARS-CoV-2 by FDA under an Emergency Use Authorization (EUA). This EUA will remain  in effect (meaning this test can be used) for the duration of the COVID-19 declaration under Section 56 4(b)(1) of the Act, 21 U.S.C. section 360bbb-3(b)(1), unless the authorization is terminated or revoked sooner. Performed at [facility], [place] [address] [place], [place] [address]   Urine culture     Status: Abnormal   Collection Time: 05/23/19 12:56 AM   Specimen: In/Out Cath Urine  Result Value Ref Range Status   Specimen Description IN/OUT CATH URINE  Final   Special Requests   Final    NONE Performed at [facility], [place] [address] [place], [place] [address]    Culture MULTIPLE SPECIES PRESENT, SUGGEST RECOLLECTION (A)  Final   Report Status 05/24/2019 FINAL  Final  MRSA PCR Screening     Status: Abnormal   Collection Time: 05/26/19  8:50 AM   Specimen: Nasopharyngeal  Result Value Ref Range Status    MRSA by PCR POSITIVE (A) NEGATIVE Final    Comment:        The GeneXpert MRSA Assay (FDA approved for NASAL specimens only), is one component of a comprehensive MRSA colonization surveillance program. It is not intended to diagnose MRSA infection nor to guide or monitor treatment for MRSA infections. RESULT CALLED TO, READ BACK BY AND VERIFIED WITH: Tawni Levy RN 10:40 05/26/19 ([email or web address]) Performed at [facility], [place] [address], [place], [place] [address]          Radiology Studies: No results found.      Scheduled Meds: . amLODipine  10 mg Oral Daily  . atorvastatin  20 mg Oral QHS  . buPROPion  300 mg Oral QHS  . Chlorhexidine Gluconate Cloth  6 each Topical Q0600  . enoxaparin (LOVENOX) injection  40 mg Subcutaneous Q24H  .  ferrous sulfate  325 mg Oral Q breakfast  . finasteride  5 mg Oral Daily  . insulin aspart  0-5 Units Subcutaneous QHS  . insulin aspart  0-9 Units Subcutaneous TID WC  . insulin glargine  15 Units Subcutaneous Daily  . metoprolol tartrate  50 mg Oral Daily  . mirabegron ER  50 mg Oral Daily  . mupirocin ointment  1 application Nasal BID  . nicotine  14 mg Transdermal Daily  . oxybutynin  15 mg Oral QHS  . oxymetazoline  2 spray Each Nare BID  . pantoprazole  40 mg Oral Daily  . PARoxetine  10 mg Oral QHS  . pregabalin  150 mg Oral BID  . senna-docusate  2 tablet Oral QHS  . tamsulosin  0.8 mg Oral QPM  . terbinafine  250 mg Oral Daily  . umeclidinium bromide  1 puff Inhalation Daily  . vitamin B-12  1,000 mcg Oral Daily   Continuous Infusions: . sodium chloride 250 mL (05/24/19 0551)  . cefTRIAXone (ROCEPHIN)  IV 2 g (05/25/19 2151)  . [START ON 05/27/2019] vancomycin       LOS: 3 days   Time spent=35 mins    Renuka Farfan Arsenio Loader, MD [facility]  If 7PM-7AM, please contact night-coverage  05/26/2019, 11:29 AM

## 2019-05-26 NOTE — Progress Notes (Signed)
Inpatient Diabetes Program Recommendations  AACE/ADA: New Consensus Statement on Inpatient Glycemic Control (2015)  Target Ranges:  Prepandial:   less than 140 mg/dL      Peak postprandial:   less than 180 mg/dL (1-2 hours)      Critically ill patients:  140 - 180 mg/dL   Lab Results  Component Value Date   GLUCAP 196 (H) 05/26/2019   HGBA1C 8.6 (H) 05/23/2019    Review of Glycemic Control  Spoke with patient about diabetes and home regimen for diabetes control. Patient reports that the CMA's at his facility check his glucose 4 x/day. He mentioned he has seen an increase in his glucose  Recently. His daughter is a Marine scientist at [employer]. Discussed Current A1c level. Pt knew that it was too high. Spoke with patient about watching what he ate at the facility and to get in touch with his doctor if glucose levels were elevated all the time.  Patient concerned about his limb. Pt also verified that worse glucose control is worse on his limb healing and ability to be salvaged.  Patient desires a second opinion in regards to his limb and requests it from a different practice.  Spoke with Dr. Reesa Chew regarding plan of care for pt.  Thanks,  Tama Headings RN, MSN, BC-ADM Inpatient Diabetes Coordinator Team Pager [phone] (8a-5p)

## 2019-05-26 NOTE — Progress Notes (Addendum)
Pharmacy Antibiotic Note  Gregory [name] is a 58 y.o. male admitted on 05/22/2019 with wound infection with possible OM  Pharmacy has been consulted for Vancomycin dosing.  WBC elevated. Noted renal dysfunction. SCR 1.79>>2.65>2.11  Peak and trough drawn: Peak 26 (3.5 hours after infusion), Trough 16 (1 hour late). AUC 527  Plan: Continue vancomycin at 1250mg  IV q24h, AUC 527 for OM F/U infectious work-up Drug levels as indicated   Height: 5\' 6"  (167.6 cm) Weight: 244 lb 7.8 oz (110.9 kg) IBW/kg (Calculated) : 63.8  Temp (24hrs), Avg:98.1 F (36.7 C), Min:98.1 F (36.7 C), Max:98.3 F (36.8 C)  Recent Labs  Lab 05/22/19 1758 05/23/19 0116 05/24/19 0537 05/25/19 0538 05/25/19 1016 05/26/19 0602  WBC 21.9*  --  12.3* 6.8  --  6.1  CREATININE 1.79*  --   --  2.65*  --  2.11*  LATICACIDVEN 2.1* 1.9  --   --   --   --   VANCOPEAK  --   --   --   --  26*  --   VANCORANDOM  --   --   --   --   --  16    Estimated Creatinine Clearance: 44 mL/min (A) (by C-G formula based on SCr of 2.11 mg/dL (H)).    Allergies  Allergen Reactions  . Latex Other (See Comments)    Listed on MAR- pt sts he is not allergic.    Twanisha Foulk A. Levada Dy, PharmD, BCPS, FNKF Clinical Pharmacist [place] Please utilize Amion for appropriate phone number to reach the unit pharmacist ([place])

## 2019-05-26 NOTE — Progress Notes (Signed)
Patient ID: Gregory [name], male   DOB: 1961/05/03, 58 y.o.   MRN: [id number] I spoke with the patient's daughter yesterday and discussed the patient still has an open draining ulcer with exposed bone and osteomyelitis.  Patient's daughter spoke with him yesterday and reinforced the recommendation to proceed with surgery for revision of above-knee amputation.  This morning patient states that he does not want to consider surgery at this time.  We discussed the possibility of an MRI scan but patient states that even with the MRI scan information he would not change his mind at this time.  Discussed with the patient the purpose of getting MRI scan would be to make a clinical decision to proceed with surgery and since he would not change his mind with the MRI scan findings do not feel an MRI scan is beneficial at this time.  Patient states he may change his mind by Friday surgery was canceled for tomorrow will reevaluate for surgery on Friday.  Patient states he wants to just continue with the antibiotics.  Patient states that his leg is very painful.  I discussed that the pain is secondary to the infection.

## 2019-05-27 ENCOUNTER — Encounter (HOSPITAL_COMMUNITY): Payer: Self-pay | Admitting: General Practice

## 2019-05-27 DIAGNOSIS — Z89511 Acquired absence of right leg below knee: Secondary | ICD-10-CM | POA: Diagnosis not present

## 2019-05-27 DIAGNOSIS — Z91040 Latex allergy status: Secondary | ICD-10-CM | POA: Diagnosis not present

## 2019-05-27 DIAGNOSIS — T8743 Infection of amputation stump, right lower extremity: Secondary | ICD-10-CM | POA: Diagnosis not present

## 2019-05-27 LAB — CBC
HCT: 32.9 % — ABNORMAL LOW (ref 39.0–52.0)
Hemoglobin: 10 g/dL — ABNORMAL LOW (ref 13.0–17.0)
MCH: 25.7 pg — ABNORMAL LOW (ref 26.0–34.0)
MCHC: 30.4 g/dL (ref 30.0–36.0)
MCV: 84.6 fL (ref 80.0–100.0)
Platelets: 216 10*3/uL (ref 150–400)
RBC: 3.89 MIL/uL — ABNORMAL LOW (ref 4.22–5.81)
RDW: 17 % — ABNORMAL HIGH (ref 11.5–15.5)
WBC: 4.9 10*3/uL (ref 4.0–10.5)
nRBC: 0 % (ref 0.0–0.2)

## 2019-05-27 LAB — BASIC METABOLIC PANEL
Anion gap: 12 (ref 5–15)
BUN: 32 mg/dL — ABNORMAL HIGH (ref 6–20)
CO2: 26 mmol/L (ref 22–32)
Calcium: 8.8 mg/dL — ABNORMAL LOW (ref 8.9–10.3)
Chloride: 99 mmol/L (ref 98–111)
Creatinine, Ser: 1.78 mg/dL — ABNORMAL HIGH (ref 0.61–1.24)
GFR calc Af Amer: 47 mL/min — ABNORMAL LOW (ref >60)
GFR calc non Af Amer: 41 mL/min — ABNORMAL LOW (ref >60)
Glucose, Bld: 265 mg/dL — ABNORMAL HIGH (ref 70–99)
Potassium: 3.9 mmol/L (ref 3.5–5.1)
Sodium: 137 mmol/L (ref 135–145)

## 2019-05-27 LAB — HEPATITIS C ANTIBODY: HCV Ab: NONREACTIVE

## 2019-05-27 LAB — GLUCOSE, CAPILLARY
Glucose-Capillary: 246 mg/dL — ABNORMAL HIGH (ref 70–99)
Glucose-Capillary: 187 mg/dL — ABNORMAL HIGH (ref 70–99)
Glucose-Capillary: 74 mg/dL (ref 70–99)
Glucose-Capillary: 220 mg/dL — ABNORMAL HIGH (ref 70–99)

## 2019-05-27 LAB — MAGNESIUM: Magnesium: 1.8 mg/dL (ref 1.7–2.4)

## 2019-05-27 LAB — C-REACTIVE PROTEIN: CRP: 10.2 mg/dL — ABNORMAL HIGH (ref <1.0)

## 2019-05-27 LAB — SEDIMENTATION RATE: Sed Rate: 102 mm/hr — ABNORMAL HIGH (ref 0–16)

## 2019-05-27 MED ORDER — INFLUENZA VAC SPLIT QUAD 0.5 ML IM SUSY
0.5000 mL | PREFILLED_SYRINGE | INTRAMUSCULAR | Status: AC
  Administered 2019-05-31: 0.5 mL via INTRAMUSCULAR
  Filled 2019-05-27: qty 0.5

## 2019-05-27 MED ORDER — INSULIN ASPART 100 UNIT/ML ~~LOC~~ SOLN
5.0000 [IU] | Freq: Three times a day (TID) | SUBCUTANEOUS | Status: DC
  Administered 2019-05-27 – 2019-05-31 (×10): 5 [IU] via SUBCUTANEOUS

## 2019-05-27 MED ORDER — OXYCODONE HCL ER 15 MG PO T12A
15.0000 mg | EXTENDED_RELEASE_TABLET | Freq: Two times a day (BID) | ORAL | Status: DC
  Administered 2019-05-27 – 2019-05-31 (×6): 15 mg via ORAL
  Filled 2019-05-27 (×7): qty 1

## 2019-05-27 MED ORDER — INSULIN GLARGINE 100 UNIT/ML ~~LOC~~ SOLN
22.0000 [IU] | Freq: Every day | SUBCUTANEOUS | Status: DC
  Administered 2019-05-28: 22 [IU] via SUBCUTANEOUS
  Filled 2019-05-27 (×2): qty 0.22

## 2019-05-27 NOTE — Progress Notes (Signed)
Inpatient Diabetes Program Recommendations  AACE/ADA: New Consensus Statement on Inpatient Glycemic Control (2015)  Target Ranges:  Prepandial:   less than 140 mg/dL      Peak postprandial:   less than 180 mg/dL (1-2 hours)      Critically ill patients:  140 - 180 mg/dL   Lab Results  Component Value Date   GLUCAP 246 (H) 05/27/2019   HGBA1C 8.6 (H) 05/23/2019    Review of Glycemic Control  Diabetes history: DM 2 Outpatient Diabetes medications: Glipizide 10 mg Daily, Humalog 0-10 units tid before meals Current orders for Inpatient glycemic control:  Lantus 15 units Novolog 0-9 units tid + hs  Inpatient Diabetes Program Recommendations:    Noncompliant in hospital with diet after I specifically discussed that with him yesterday.   Glucose 200's this am. Consider increasing Lantus to 22-24 units (0.2 units/kg).  Thanks,  Tama Headings RN, MSN, BC-ADM Inpatient Diabetes Coordinator Team Pager [phone] (8a-5p)

## 2019-05-27 NOTE — Consult Note (Signed)
@LOGO @  Reason for Consult:Wound of Right leg above knee amputation  Referring Physician: Dr. Orvan Seen is an 58 y.o. male.  HPI: Presented with ulcer and purulent drainage of right above the knee amputation.  Hx of uncontrolled type 2 diabetes, smoking, and protein caloric malnutrition. Has had multiple surgical interventions for infections of right lower extremity.   Past Medical History:  Diagnosis Date  . Acquired absence of left great toe ([place])   . Acquired absence of right leg below knee ([place])   . Acute osteomyelitis of calcaneum, right ([place]) 10/02/2016  . Acute respiratory failure ([place])   . Amputation stump infection ([facility]) 08/12/2018  . Anemia   . Basal cell carcinoma, eyelid   . Cancer ([place])   . Candidiasis 08/12/2018  . CHF (congestive heart failure) (HCC)    chronic diastolic   . Chronic back pain   . Chronic kidney disease    stage 3   . Chronic multifocal osteomyelitis (HCC)    of ankle and foot   . Chronic pain syndrome   . COPD (chronic obstructive pulmonary disease) ([place])   . DDD (degenerative disc disease), lumbar   . Depression    major depressive disorder   . Diabetes mellitus without complication ([place])    type 2   . Diabetic ulcer of toe of left foot ([place]) 10/02/2016  . Difficulty in walking, not elsewhere classified   . 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 Foot drop, bilateral   . Foot drop, right 12/27/2015  . GERD (gastroesophageal reflux disease)   . Herpesviral vesicular dermatitis   . Hyperlipidemia   . Hyperlipidemia   . Hypertension   . Insomnia   . Malignant melanoma of other parts of face ([place])   . MRSA bacteremia   . Muscle weakness (generalized)   . Nasal congestion   . Necrosis of toe ([place]) 07/08/2018  . Neuromuscular dysfunction of bladder   . Osteomyelitis ([place])   . Osteomyelitis ([place])   . Osteomyelitis ([place])    right BKA  . Other idiopathic peripheral autonomic neuropathy   . Retention of urine, unspecified   . Squamous cell  carcinoma of skin   . Unsteadiness on feet   . Urinary retention   . Urinary retention   . Urinary tract infection   . Wears dentures   . Wears glasses     Past Surgical History:  Procedure Laterality Date  . AMPUTATION Left 03/29/2017   Procedure: LEFT GREAT TOE AMPUTATION AT METATARSOPHALANGEAL JOINT;  Surgeon: [name], MD;  Location: [facility];  Service: Orthopedics;  Laterality: Left;  . AMPUTATION Right 03/29/2017   Procedure: RIGHT BELOW KNEE AMPUTATION;  Surgeon: [name], MD;  Location: [facility];  Service: Orthopedics;  Laterality: Right;  . AMPUTATION Left 06/06/2018   Procedure: LEFT 2ND TOE AMPUTATION;  Surgeon: [name], MD;  Location: [facility];  Service: Orthopedics;  Laterality: Left;  . AMPUTATION Right 11/19/2018   Procedure: AMPUTATION ABOVE KNEE;  Surgeon: [name], MD;  Location: [facility];  Service: Orthopedics;  Laterality: Right;  . ANTERIOR CERVICAL CORPECTOMY N/A 11/25/2015   Procedure: ANTERIOR CERVICAL FIVE CORPECTOMY Cervical four - six fusion;  Surgeon: Consuella Lose, MD;  Location: [facility];  Service: Neurosurgery;  Laterality: N/A;  ANTERIOR CERVICAL FIVE CORPECTOMY Cervical four - six fusion  . APPLICATION OF WOUND VAC Right 10/22/2018   Procedure: Application Of Wound Vac;  Surgeon: [name], MD;  Location: [facility];  Service: Orthopedics;  Laterality: Right;  . CYSTOSCOPY WITH RETROGRADE URETHROGRAM N/A 04/25/2018   Procedure: CYSTOSCOPY WITH RETROGRADE URETHROGRAM/ BALLOON DILATION;  Surgeon: Kathie Rhodes, MD;  Location[facility];  Service: Urology;  Laterality: N/A;  . MULTIPLE TOOTH EXTRACTIONS    . POSTERIOR CERVICAL FUSION/FORAMINOTOMY N/A 11/29/2015   Procedure: Cervical Three-Cervical Seven Posterior Cervical Laminectomy with Fusion;  Surgeon: Consuella Lose, MD;  Location: [facility];  Service: Neurosurgery;  Laterality: N/A;  Cervical Three-Cervical Seven Posterior Cervical Laminectomy with Fusion  . STUMP REVISION Right 10/22/2018    Procedure: REVISION RIGHT BELOW KNEE AMPUTATION;  Surgeon: [name], MD;  Location: [facility];  Service: Orthopedics;  Laterality: Right;  . STUMP REVISION Right 12/05/2018   Procedure: Revision Right Above Knee Amputation;  Surgeon: [name], MD;  Location: [facility];  Service: Orthopedics;  Laterality: Right;    Family History  Problem Relation Age of Onset  . Cancer Other   . Diabetes Other     Social History:  reports that he has been smoking cigarettes. He has been smoking about 0.50 packs per day. He has never used smokeless tobacco. He reports that he does not drink alcohol or use drugs.  Allergies:  Allergies  Allergen Reactions  . Latex Other (See Comments)    Listed on MAR- pt sts he is not allergic.    Medications: I have reviewed the patient's current medications.  Results for orders placed or performed during the hospital encounter of 05/22/19 (from the past 48 hour(s))  Glucose, capillary     Status: Abnormal   Collection Time: 05/25/19  4:11 PM  Result Value Ref Range   Glucose-Capillary 226 (H) 70 - 99 mg/dL  Glucose, capillary     Status: Abnormal   Collection Time: 05/25/19  8:49 PM  Result Value Ref Range   Glucose-Capillary 242 (H) 70 - 99 mg/dL  Vancomycin, random     Status: None   Collection Time: 05/26/19  6:02 AM  Result Value Ref Range   Vancomycin Rm 16     Comment:        Random Vancomycin therapeutic range is dependent on dosage and time of specimen collection. A peak range is 20.0-40.0 ug/mL A trough range is 5.0-15.0 ug/mL        Performed at [facility] [address], [place],  [id number]   Basic metabolic panel     Status: Abnormal   Collection Time: 05/26/19  6:02 AM  Result Value Ref Range   Sodium 136 135 - 145 mmol/L   Potassium 3.8 3.5 - 5.1 mmol/L   Chloride 99 98 - 111 mmol/L   CO2 25 22 - 32 mmol/L   Glucose, Bld 218 (H) 70 - 99 mg/dL   BUN 36 (H) 6 - 20 mg/dL   Creatinine, Ser 2.11 (H) 0.61 - 1.24  mg/dL   Calcium 8.7 (L) 8.9 - 10.3 mg/dL   GFR calc non Af Amer 33 (L) >60 mL/min   GFR calc Af Amer 39 (L) >60 mL/min   Anion gap 12 5 - 15    Comment: Performed at [facility] [address] [place], [place] [address]  CBC Daily 5 days     Status: Abnormal   Collection Time: 05/26/19  6:02 AM  Result Value Ref Range   WBC 6.1 4.0 - 10.5 K/uL   RBC 3.90 (L) 4.22 - 5.81 MIL/uL   Hemoglobin 10.1 (L) 13.0 - 17.0 g/dL  HCT 32.7 (L) 39.0 - 52.0 %   MCV 83.8 80.0 - 100.0 fL   MCH 25.9 (L) 26.0 - 34.0 pg   MCHC 30.9 30.0 - 36.0 g/dL   RDW 17.6 (H) 11.5 - 15.5 %   Platelets 211 150 - 400 K/uL   nRBC 0.0 0.0 - 0.2 %    Comment: Performed at [facility] [address] [place], [place] [address]  Mg Daily 5 days     Status: None   Collection Time: 05/26/19  6:02 AM  Result Value Ref Range   Magnesium 1.7 1.7 - 2.4 mg/dL    Comment: Performed at [facility], [place] [address] [place], [place] [address]  Glucose, capillary     Status: Abnormal   Collection Time: 05/26/19  6:55 AM  Result Value Ref Range   Glucose-Capillary 178 (H) 70 - 99 mg/dL  MRSA PCR Screening     Status: Abnormal   Collection Time: 05/26/19  8:50 AM   Specimen: Nasopharyngeal  Result Value Ref Range   MRSA by PCR POSITIVE (A) NEGATIVE    Comment:        The GeneXpert MRSA Assay (FDA approved for NASAL specimens only), is one component of a comprehensive MRSA colonization surveillance program. It is not intended to diagnose MRSA infection nor to guide or monitor treatment for MRSA infections. RESULT CALLED TO, READ BACK BY AND VERIFIED WITH: Tawni Levy RN 10:40 05/26/19 ([email or web address]) Performed at [facility], [place] [address], [place], [place] [address]   Glucose, capillary     Status: Abnormal   Collection Time: 05/26/19 11:27 AM  Result Value Ref Range   Glucose-Capillary 196 (H) 70 - 99 mg/dL  Glucose, capillary     Status: Abnormal   Collection Time: 05/26/19  4:45 PM   Result Value Ref Range   Glucose-Capillary 231 (H) 70 - 99 mg/dL  Glucose, capillary     Status: Abnormal   Collection Time: 05/26/19  8:43 PM  Result Value Ref Range   Glucose-Capillary 236 (H) 70 - 99 mg/dL  CBC Daily 5 days     Status: Abnormal   Collection Time: 05/27/19  2:27 AM  Result Value Ref Range   WBC 4.9 4.0 - 10.5 K/uL   RBC 3.89 (L) 4.22 - 5.81 MIL/uL   Hemoglobin 10.0 (L) 13.0 - 17.0 g/dL   HCT 32.9 (L) 39.0 - 52.0 %   MCV 84.6 80.0 - 100.0 fL   MCH 25.7 (L) 26.0 - 34.0 pg   MCHC 30.4 30.0 - 36.0 g/dL   RDW 17.0 (H) 11.5 - 15.5 %   Platelets 216 150 - 400 K/uL   nRBC 0.0 0.0 - 0.2 %    Comment: Performed at [facility], [place]. [address] [place], [place] [address]  BMP daily 5 days     Status: Abnormal   Collection Time: 05/27/19  2:27 AM  Result Value Ref Range   Sodium 137 135 - 145 mmol/L   Potassium 3.9 3.5 - 5.1 mmol/L   Chloride 99 98 - 111 mmol/L   CO2 26 22 - 32 mmol/L   Glucose, Bld 265 (H) 70 - 99 mg/dL   BUN 32 (H) 6 - 20 mg/dL   Creatinine, Ser 1.78 (H) 0.61 - 1.24 mg/dL   Calcium 8.8 (L) 8.9 - 10.3 mg/dL   GFR calc non Af Amer 41 (L) >60 mL/min   GFR calc Af Amer 47 (L) >60 mL/min  Anion gap 12 5 - 15    Comment: Performed at [facility] [address] [place], [place] [address]  Mg Daily 5 days     Status: None   Collection Time: 05/27/19  2:27 AM  Result Value Ref Range   Magnesium 1.8 1.7 - 2.4 mg/dL    Comment: Performed at [facility], [place] [address], [place], [place] [address]  C-reactive protein     Status: Abnormal   Collection Time: 05/27/19  2:27 AM  Result Value Ref Range   CRP 10.2 (H) <1.0 mg/dL    Comment: Performed at [facility] [address] [place], [place] [address]  Sedimentation rate     Status: Abnormal   Collection Time: 05/27/19  2:27 AM  Result Value Ref Range   Sed Rate 102 (H) 0 - 16 mm/hr    Comment: Performed at [facility] [address], [place], [place] [address]   Hepatitis C antibody     Status: None   Collection Time: 05/27/19  2:27 AM  Result Value Ref Range   HCV Ab NON REACTIVE NON REACTIVE    Comment: (NOTE) Nonreactive HCV antibody screen is consistent with no HCV infections,  unless recent infection is suspected or other evidence exists to indicate HCV infection. Performed at [facility], [place] [address] [place], [place] [address]   Glucose, capillary     Status: Abnormal   Collection Time: 05/27/19  6:55 AM  Result Value Ref Range   Glucose-Capillary 246 (H) 70 - 99 mg/dL  Glucose, capillary     Status: Abnormal   Collection Time: 05/27/19 11:28 AM  Result Value Ref Range   Glucose-Capillary 187 (H) 70 - 99 mg/dL    No results found.  Review of Systems  Constitutional: Negative for fever.  Musculoskeletal:       Right-above knee amputation, painful, redness, drainage, states he had repeated surgeries    Blood pressure 135/64, pulse (!) 59, temperature 97.6 F (36.4 C), temperature source Oral, resp. rate 18, height 5\' 6"  (1.676 m), weight 110.4 kg, SpO2 93 %. Physical Exam  Constitutional: He is oriented to person, place, and time. He appears well-developed.  HENT:  Head: Normocephalic and atraumatic.  Eyes: EOM are normal.  Respiratory: Effort normal.  Musculoskeletal:     Comments: Right above knee amputation, redness and small amount of drainage present, patient continually rubs top of thigh throughout conversation  Neurological: He is alert and oriented to person, place, and time.  Skin: Skin is warm and dry. There is erythema (end of amputation site).  Psychiatric: He has a normal mood and affect. His behavior is normal. Judgment and thought content normal.    Assessment/Plan:  Mr. Misko is a 58 year old man with ulcer abscess and osteomyelitis of the right above knee amputation.  Complicated by uncontrolled diabetes.   Discussed case with Dr. Marla Roe. In agreement with Dr. Jess Barters management plan for  revision of the above knee amputation.     Discussed current condition with the patient. At this time the patient feels he is ready to move forward with the amputation revision with [name].  He asked if he could speak with [name] one more time prior to having the surgery.   We also discussed the negative impact smoking and uncontrolled diabetes has on proper wound healing.  Patient states he is ready to quit smoking to improve his chances at better wound  healing. He has quit before so feels this is achievable.  He also expressed understanding of the negative impact uncontrolled diabetes has on wound healing.  Thank you for the consult opportunity.  Threasa Heads, [name] 05/27/2019, 2:56 PM

## 2019-05-27 NOTE — Progress Notes (Signed)
[facility] for Infectious Disease  Date of Admission:  05/22/2019     Total days of antibiotics 6         ASSESSMENT:  Gregory [name] continues to have pain in his right stump and is awaiting second opinion regarding need for surgery from Dr. Marla Roe. MRI currently on hold pending any surgical decisions. Inflammatory makers elevated as would be expected given osteomyelitis. Continue current dose of vancomycin and ceftriaxone pending plastic surgery consult. Creatinine improved today and without evidence of nephrotoxicity. Discussed importance of blood sugar control to optimize healing.   PLAN:  1. Continue vancomycin and ceftriaxone.  2. Renal function stable with Vancomycin continue to monitor per pharmacy protocol.  3. Diabetes management per primary team.   Principal Problem:   Amputation stump infection (Gregory [name]) Active Problems:   Type II diabetes mellitus with neurological manifestations (Gregory [name])   CKD (chronic kidney disease), stage III   Tobacco use   Sepsis (Gregory [name])   . amLODipine  10 mg Oral Daily  . atorvastatin  20 mg Oral QHS  . buPROPion  300 mg Oral QHS  . Chlorhexidine Gluconate Cloth  6 each Topical Q0600  . enoxaparin (LOVENOX) injection  40 mg Subcutaneous Q24H  . ferrous sulfate  325 mg Oral Q breakfast  . finasteride  5 mg Oral Daily  . insulin aspart  0-5 Units Subcutaneous QHS  . insulin aspart  0-9 Units Subcutaneous TID WC  . insulin aspart  5 Units Subcutaneous TID WC  . [START ON 05/28/2019] insulin glargine  22 Units Subcutaneous Daily  . metoprolol tartrate  50 mg Oral Daily  . mirabegron ER  50 mg Oral Daily  . mupirocin ointment  1 application Nasal BID  . nicotine  14 mg Transdermal Daily  . oxybutynin  15 mg Oral QHS  . oxymetazoline  2 spray Each Nare BID  . pantoprazole  40 mg Oral Daily  . PARoxetine  10 mg Oral QHS  . pregabalin  150 mg Oral BID  . senna-docusate  2 tablet Oral QHS  . tamsulosin  0.8 mg Oral QPM  . terbinafine  250 mg  Oral Daily  . umeclidinium bromide  1 puff Inhalation Daily  . vitamin B-12  1,000 mcg Oral Daily    SUBJECTIVE:  Afebrile overnight with no acute events/concerns. MRI has not been completed. Inflammatory markers elevated with CRP of 10.2 and ESR of 102. Awaiting plastic surgery evaluation for second opinion. Continues to have pain in his right stump.   Allergies  Allergen Reactions  . Latex Other (See Comments)    Listed on MAR- pt sts he is not allergic.     Review of Systems: Review of Systems  Constitutional: Negative for chills, fever and weight loss.  Respiratory: Negative for cough, shortness of breath and wheezing.   Cardiovascular: Negative for chest pain and leg swelling.  Gastrointestinal: Negative for abdominal pain, constipation, diarrhea, nausea and vomiting.  Musculoskeletal:       Positive for right lower extremity stump pain.   Skin: Negative for rash.      OBJECTIVE: Vitals:   05/26/19 1650 05/26/19 2045 05/27/19 0512 05/27/19 0849  BP: 137/73 (!) 147/74 (!) 151/71 135/64  Pulse: (!) 56 (!) 51 (!) 56 (!) 59  Resp: _0 Temp: 98 F (36.7 C) 97.7 F (36.5 C) 97.8 F (36.6 C) 97.6 F (36.4 C)  TempSrc: Oral Oral Oral Oral  SpO2: 93% 92% 92% 93%  Weight:  110.4 kg   Height:       Body mass index is 39.28 kg/m.  Physical Exam Constitutional:      General: He is not in acute distress.    Appearance: He is well-developed. He is obese.     Comments: Lying in bed with head of bed elevated; pleasant.   Cardiovascular:     Rate and Rhythm: Normal rate and regular rhythm.     Heart sounds: Normal heart sounds.  Pulmonary:     Effort: Pulmonary effort is normal.     Breath sounds: Normal breath sounds.  Musculoskeletal:     Comments: Right stump remains unchanged from previous.   Skin:    General: Skin is warm and dry.  Neurological:     Mental Status: He is alert and oriented to person, place, and time.  Psychiatric:        Mood and  Affect: Mood normal.     Lab Results Lab Results  Component Value Date   WBC 4.9 05/27/2019   HGB 10.0 (L) 05/27/2019   HCT 32.9 (L) 05/27/2019   MCV 84.6 05/27/2019   PLT 216 05/27/2019    Lab Results  Component Value Date   CREATININE 1.78 (H) 05/27/2019   BUN 32 (H) 05/27/2019   NA 137 05/27/2019   K 3.9 05/27/2019   CL 99 05/27/2019   CO2 26 05/27/2019    Lab Results  Component Value Date   ALT 12 05/22/2019   AST 13 (L) 05/22/2019   ALKPHOS 90 05/22/2019   BILITOT 0.5 05/22/2019     Microbiology: Recent Results (from the past 240 hour(s))  Culture, blood (Routine x 2)     Status: None (Preliminary result)   Collection Time: 05/22/19 10:00 PM   Specimen: BLOOD  Result Value Ref Range Status   Specimen Description BLOOD LEFT ARM  Final   Special Requests   Final    BOTTLES DRAWN AEROBIC AND ANAEROBIC Blood Culture adequate volume   Culture   Final    NO GROWTH 4 DAYS Performed at [facility], [address] [place], [place] [address]    Report Status PENDING  Incomplete  Culture, blood (Routine x 2)     Status: None (Preliminary result)   Collection Time: 05/22/19 10:30 PM   Specimen: BLOOD  Result Value Ref Range Status   Specimen Description BLOOD RIGHT ARM  Final   Special Requests   Final    BOTTLES DRAWN AEROBIC ONLY Blood Culture adequate volume   Culture   Final    NO GROWTH 4 DAYS Performed at [facility], [place] [address], [place], [place] [address]    Report Status PENDING  Incomplete  SARS CORONAVIRUS 2 (TAT 6-24 HRS) Nasopharyngeal Nasopharyngeal Swab     Status: None   Collection Time: 05/23/19 12:56 AM   Specimen: Nasopharyngeal Swab  Result Value Ref Range Status   SARS Coronavirus 2 NEGATIVE NEGATIVE Final    Comment: (NOTE) SARS-CoV-2 target nucleic acids are NOT DETECTED. The SARS-CoV-2 RNA is generally detectable in upper and lower respiratory specimens during the acute phase of infection. Negative results do not  preclude SARS-CoV-2 infection, do not rule out co-infections with other pathogens, and should not be used as the sole basis for treatment or other patient management decisions. Negative results must be combined with clinical observations, patient history, and epidemiological information. The expected result is Negative. Fact Sheet for Patients: SugarRoll.be Fact Sheet for Healthcare Providers: https://www.woods-mathews.com/ This  test is not yet approved or cleared by the [place] and  has been authorized for detection and/or diagnosis of SARS-CoV-2 by FDA under an Emergency Use Authorization (EUA). This EUA will remain  in effect (meaning this test can be used) for the duration of the COVID-19 declaration under Section 56 4(b)(1) of the Act, 21 U.S.C. section 360bbb-3(b)(1), unless the authorization is terminated or revoked sooner. Performed at [facility], [place] [address], [place], [place] [address]   Urine culture     Status: Abnormal   Collection Time: 05/23/19 12:56 AM   Specimen: In/Out Cath Urine  Result Value Ref Range Status   Specimen Description IN/OUT CATH URINE  Final   Special Requests   Final    NONE Performed at [facility], [place] [address] [place], [place] [address]    Culture MULTIPLE SPECIES PRESENT, SUGGEST RECOLLECTION (A)  Final   Report Status 05/24/2019 FINAL  Final  MRSA PCR Screening     Status: Abnormal   Collection Time: 05/26/19  8:50 AM   Specimen: Nasopharyngeal  Result Value Ref Range Status   MRSA by PCR POSITIVE (A) NEGATIVE Final    Comment:        The GeneXpert MRSA Assay (FDA approved for NASAL specimens only), is one component of a comprehensive MRSA colonization surveillance program. It is not intended to diagnose MRSA infection nor to guide or monitor treatment for MRSA infections. RESULT CALLED TO, READ BACK BY AND VERIFIED WITH: Tawni Levy RN 10:40 05/26/19 ([email or web address])  Performed at [facility], [place] [address] [place], [place] [address]      Terri Piedra, [facility] [phone] Pager  05/27/2019  11:37 AM

## 2019-05-27 NOTE — Progress Notes (Signed)
PROGRESS NOTE    Gregory [name]  [id number] DOB: 02-09-1961 DOA: 05/22/2019 PCP: [name], MD   Brief Narrative:  58 year old with history of right AKA with chronic stump infection, multifocal osteomyelitis, chronic pain syndrome, chronic diastolic congestive heart failure, CKD stage III, COPD, DM 2, HTN, HLD came to the hospital with complains of right leg pain with some drainage around that site.  Upon admission he was noted to be febrile with elevated WBC with concerns of infection of the right lower extremity stump.  X-ray was negative for any bony involvement.  Orthopedic, Dr. Lorin Mercy was consulted. Ortho plans for revision of right aka.  Hesitant about surgical intervention.  ID consulted for antibiotic management.  Plastic surgery consulted for second opinion at the request of the patient   Assessment & Plan:   Principal Problem:   Amputation stump infection ([place]) Active Problems:   Type II diabetes mellitus with neurological manifestations ([place])   CKD (chronic kidney disease), stage III   Tobacco use   Sepsis ([place])   Sepsis secondary to infected right femoral stump/osteomyelitis/status post right AKA with chronic ulcer -Sepsis physiology slowly improving follow-up culture data.  Per Ortho requires revision of AKA but patient hesitant and requesting second opinion -Consulted plastic surgery for second opinion. -Continue IV vancomycin and Rocephin.  Closely monitor renal function -Pain control, bowel regimen -ID following for antibiotic management -No need for MRI at this time. -Sed rate/CRP-elevated  Chronic left lower extremity lymphedema with skin changes -Compression stocking.  Supportive care.  Acute kidney injury on CKD stage IIIb -Creatinine baseline at 1.7.  Creatinine improved down to 1.7 today.  Diabetes mellitus type 2, poorly controlled secondary to hyperglycemia -Insulin sliding scale and Accu-Chek -Increase Lantus to 22 units daily.  Will add  premeal insulins -A1c-8.6  Essential hypertension -Norvasc 10 mg daily.  Metoprolol 50 mg daily.  Chronic diastolic congestive heart failure -Clinically appears to be euvolemic.  Does have chronic lower extremity edema.  Anemia of chronic disease -Baseline hemoglobin around 11.  No obvious evidence of bleeding. Cont Iron Supplements.   HLD= Cont Lipitor.   DVT prophylaxis: Lovenox Code Status: Full code Family Communication: None Disposition Plan: Maintain hospital stay for IV antibiotics.  Tentatively scheduled for surgery on Friday.  Consultants:   Orthopedic  Infectious disease  Plastic surgery for second opinion regarding surgery at the request of patient  Procedures:   None   Antimicrobials:   IV Vanc, Roc   Subjective: Patient continues to report of right lower extremity stump discomfort.  Also continues to request for second opinion from surgical specialty to see if revision is still the right option for him.  I explained to him that at this time it is the best option but I also consulted plastic surgery for their input.  He seems to be more receptive of this and will consider surgery if they say so.  Review of Systems Otherwise negative except as per HPI, including: General = no fevers, chills, dizziness, malaise, fatigue HEENT/EYES = negative for pain, redness, loss of vision, double vision, blurred vision, loss of hearing, sore throat, hoarseness, dysphagia Cardiovascular= negative for chest pain, palpitation, murmurs, lower extremity swelling Respiratory/lungs= negative for shortness of breath, cough, hemoptysis, wheezing, mucus production Gastrointestinal= negative for nausea, vomiting,, abdominal pain, melena, hematemesis Genitourinary= negative for Dysuria, Hematuria, Change in Urinary Frequency MSK = Negative for arthralgia, myalgias, Back Pain, Joint swelling  Neurology= Negative for headache, seizures, numbness, tingling  Psychiatry= Negative for  anxiety,  depression, suicidal and homocidal ideation Allergy/Immunology= Medication/Food allergy as listed  Skin= Negative for Rash, lesions, ulcers, itching  Objective: Vitals:   05/26/19 1650 05/26/19 2045 05/27/19 0512 05/27/19 0849  BP: 137/73 (!) 147/74 (!) 151/71 135/64  Pulse: (!) 56 (!) 51 (!) 56 (!) 59  Resp: 18 14 16 18   Temp: 98 F (36.7 C) 97.7 F (36.5 C) 97.8 F (36.6 C) 97.6 F (36.4 C)  TempSrc: Oral Oral Oral Oral  SpO2: 93% 92% 92% 93%  Weight:   110.4 kg   Height:        Intake/Output Summary (Last 24 hours) at 05/27/2019 1041 Last data filed at 05/27/2019 0848 Gross per 24 hour  Intake 1208.08 ml  Output 2600 ml  Net -1391.92 ml   Filed Weights   05/22/19 1701 05/26/19 0426 05/27/19 0512  Weight: 113.4 kg 110.9 kg 110.4 kg    Examination: Constitutional: Not in acute distress Respiratory: Clear to auscultation bilaterally Cardiovascular: Normal sinus rhythm, no rubs Abdomen: Nontender nondistended good bowel sounds Musculoskeletal: No edema noted Skin: RLE AKA with some drainage.  Previous incision scar noted.  Left lower extremity chronic skin changes. Neurologic: CN 2-12 grossly intact.  And nonfocal Psychiatric: Normal judgment and insight. Alert and oriented x 3. Normal mood.   Data Reviewed:   CBC: Recent Labs  Lab 05/22/19 1758 05/24/19 0537 05/25/19 0538 05/26/19 0602 05/27/19 0227  WBC 21.9* 12.3* 6.8 6.1 4.9  NEUTROABS 20.6*  --   --   --   --   HGB 11.8* 10.8* 9.2* 10.1* 10.0*  HCT 37.8* 35.4* 30.1* 32.7* 32.9*  MCV 83.4 85.3 84.1 83.8 84.6  PLT 256 175 168 211 [id number]   Basic Metabolic Panel: Recent Labs  Lab 05/22/19 1758 05/25/19 0538 05/26/19 0602 05/27/19 0227  NA 135 137 136 137  K 4.4 3.7 3.8 3.9  CL 100 103 99 99  CO2 23 24 25 26   GLUCOSE 209* 252* 218* 265*  BUN 21* 38* 36* 32*  CREATININE 1.79* 2.65* 2.11* 1.78*  CALCIUM 9.0 8.1* 8.7* 8.8*  MG  --   --  1.7 1.8   GFR: Estimated Creatinine Clearance: 52.1  mL/min (A) (by C-G formula based on SCr of 1.78 mg/dL (H)). Liver Function Tests: Recent Labs  Lab 05/22/19 1758  AST 13*  ALT 12  ALKPHOS 90  BILITOT 0.5  PROT 7.4  ALBUMIN 3.3*   No results for input(s): LIPASE, AMYLASE in the last 168 hours. No results for input(s): AMMONIA in the last 168 hours. Coagulation Profile: No results for input(s): INR, PROTIME in the last 168 hours. Cardiac Enzymes: No results for input(s): CKTOTAL, CKMB, CKMBINDEX, TROPONINI in the last 168 hours. BNP (last 3 results) No results for input(s): PROBNP in the last 8760 hours. HbA1C: No results for input(s): HGBA1C in the last 72 hours. CBG: Recent Labs  Lab 05/26/19 0655 05/26/19 1127 05/26/19 1645 05/26/19 2043 05/27/19 0655  GLUCAP 178* 196* 231* 236* 246*   Lipid Profile: No results for input(s): CHOL, HDL, LDLCALC, TRIG, CHOLHDL, LDLDIRECT in the last 72 hours. Thyroid Function Tests: No results for input(s): TSH, T4TOTAL, FREET4, T3FREE, THYROIDAB in the last 72 hours. Anemia Panel: No results for input(s): VITAMINB12, FOLATE, FERRITIN, TIBC, IRON, RETICCTPCT in the last 72 hours. Sepsis Labs: Recent Labs  Lab 05/22/19 1758 05/23/19 0116  LATICACIDVEN 2.1* 1.9    Recent Results (from the past 240 hour(s))  Culture, blood (Routine x 2)     Status: None (Preliminary  result)   Collection Time: 05/22/19 10:00 PM   Specimen: BLOOD  Result Value Ref Range Status   Specimen Description BLOOD LEFT ARM  Final   Special Requests   Final    BOTTLES DRAWN AEROBIC AND ANAEROBIC Blood Culture adequate volume   Culture   Final    NO GROWTH 3 DAYS Performed at [facility], [address], [place], [place] [address]    Report Status PENDING  Incomplete  Culture, blood (Routine x 2)     Status: None (Preliminary result)   Collection Time: 05/22/19 10:30 PM   Specimen: BLOOD  Result Value Ref Range Status   Specimen Description BLOOD RIGHT ARM  Final   Special Requests   Final     BOTTLES DRAWN AEROBIC ONLY Blood Culture adequate volume   Culture   Final    NO GROWTH 3 DAYS Performed at [facility], [place] [address], [place], [place] [address]    Report Status PENDING  Incomplete  SARS CORONAVIRUS 2 (TAT 6-24 HRS) Nasopharyngeal Nasopharyngeal Swab     Status: None   Collection Time: 05/23/19 12:56 AM   Specimen: Nasopharyngeal Swab  Result Value Ref Range Status   SARS Coronavirus 2 NEGATIVE NEGATIVE Final    Comment: (NOTE) SARS-CoV-2 target nucleic acids are NOT DETECTED. The SARS-CoV-2 RNA is generally detectable in upper and lower respiratory specimens during the acute phase of infection. Negative results do not preclude SARS-CoV-2 infection, do not rule out co-infections with other pathogens, and should not be used as the sole basis for treatment or other patient management decisions. Negative results must be combined with clinical observations, patient history, and epidemiological information. The expected result is Negative. Fact Sheet for Patients: SugarRoll.be Fact Sheet for Healthcare Providers: https://www.woods-mathews.com/ This test is not yet approved or cleared by the [place] FDA and  has been authorized for detection and/or diagnosis of SARS-CoV-2 by FDA under an Emergency Use Authorization (EUA). This EUA will remain  in effect (meaning this test can be used) for the duration of the COVID-19 declaration under Section 56 4(b)(1) of the Act, 21 U.S.C. section 360bbb-3(b)(1), unless the authorization is terminated or revoked sooner. Performed at [facility], [place] [address], [place], [place] [address]   Urine culture     Status: Abnormal   Collection Time: 05/23/19 12:56 AM   Specimen: In/Out Cath Urine  Result Value Ref Range Status   Specimen Description IN/OUT CATH URINE  Final   Special Requests   Final    NONE Performed at [facility], [place] [address]  [place], [place] [address]    Culture MULTIPLE SPECIES PRESENT, SUGGEST RECOLLECTION (A)  Final   Report Status 05/24/2019 FINAL  Final  MRSA PCR Screening     Status: Abnormal   Collection Time: 05/26/19  8:50 AM   Specimen: Nasopharyngeal  Result Value Ref Range Status   MRSA by PCR POSITIVE (A) NEGATIVE Final    Comment:        The GeneXpert MRSA Assay (FDA approved for NASAL specimens only), is one component of a comprehensive MRSA colonization surveillance program. It is not intended to diagnose MRSA infection nor to guide or monitor treatment for MRSA infections. RESULT CALLED TO, READ BACK BY AND VERIFIED WITH: Tawni Levy RN 10:40 05/26/19 ([email or web address]) Performed at [facility], [place] [address] [place], [place] [address]          Radiology Studies: No results found.  Scheduled Meds: . amLODipine  10 mg Oral Daily  . atorvastatin  20 mg Oral QHS  . buPROPion  300 mg Oral QHS  . Chlorhexidine Gluconate Cloth  6 each Topical Q0600  . enoxaparin (LOVENOX) injection  40 mg Subcutaneous Q24H  . ferrous sulfate  325 mg Oral Q breakfast  . finasteride  5 mg Oral Daily  . insulin aspart  0-5 Units Subcutaneous QHS  . insulin aspart  0-9 Units Subcutaneous TID WC  . insulin glargine  15 Units Subcutaneous Daily  . metoprolol tartrate  50 mg Oral Daily  . mirabegron ER  50 mg Oral Daily  . mupirocin ointment  1 application Nasal BID  . nicotine  14 mg Transdermal Daily  . oxybutynin  15 mg Oral QHS  . oxymetazoline  2 spray Each Nare BID  . pantoprazole  40 mg Oral Daily  . PARoxetine  10 mg Oral QHS  . pregabalin  150 mg Oral BID  . senna-docusate  2 tablet Oral QHS  . tamsulosin  0.8 mg Oral QPM  . terbinafine  250 mg Oral Daily  . umeclidinium bromide  1 puff Inhalation Daily  . vitamin B-12  1,000 mcg Oral Daily   Continuous Infusions: . sodium chloride 250 mL (05/24/19 0551)  . cefTRIAXone (ROCEPHIN)  IV 2 g (05/26/19 2205)  . vancomycin 1,250 mg  (05/27/19 0838)     LOS: 4 days   Time spent= 35 mins    Ankit Arsenio Loader, MD [facility]  If 7PM-7AM, please contact night-coverage  05/27/2019, 10:41 AM

## 2019-05-27 NOTE — Progress Notes (Signed)
Physical Therapy Treatment Patient Details Name: Gregory [name] MRN: [id number] DOB: 1960/08/05 Today's Date: 05/27/2019    History of Present Illness 58 y.o. 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    PT Comments    Pt received in bed with RN in room with pt initially reporting "no therapy today" but then agreeable to reviewing bed level therapeutic exercises with PT. Pt reviewed RLE hip abduction and LLE hip abduction, heel slides & straight leg raises with pt teaching back exercises.  Educated pt on importance of participation in PT to prevent muscle weakness, and risk of becoming weak if laying in bed for days. Pt reports he will participate in more therapy when his pain decreases & reports he is planning to have 2nd surgery on Friday after speaking with plastic surgeon as his second opinion. Encouraged pt to perform BLE exercises on his own throughout the day & pt agreeable.   Will continue to follow pt acutely to focus on OOB activity when pt agreeable.   Follow Up Recommendations  SNF     Equipment Recommendations  (TBD in next venue)    Recommendations for Other Services       Precautions / Restrictions Precautions Precautions: Fall Precaution Comments: old R AKA Restrictions Weight Bearing Restrictions: No    Mobility  Bed Mobility                  Transfers                    Ambulation/Gait                 Stairs             Wheelchair Mobility    Modified Rankin (Stroke Patients Only)       Balance                                            Cognition   Behavior During Therapy: WFL for tasks  assessed/performed Overall Cognitive Status: Within Functional Limits for tasks assessed                                        Exercises      General Comments        Pertinent Vitals/Pain Pain Assessment: Faces Faces Pain Scale: Hurts whole lot Pain Location: R AKA Pain Descriptors / Indicators: Constant Pain Intervention(s): Monitored during session(RN aware & about to administer pain meds)    Home Living Family/patient expects to be discharged to:: Skilled nursing facility Living Arrangements: Other (Comment)(snf)                  Prior Function            PT Goals (current goals can now be found in the care plan section) Acute Rehab PT Goals Patient Stated Goal: decrease pain PT Goal Formulation: With patient Time For Goal Achievement: 06/07/19 Potential to Achieve Goals: Fair Progress towards PT goals: PT to reassess next treatment    Frequency  Min 2X/week      PT Plan Current plan remains appropriate    Co-evaluation              AM-PAC PT "6 Clicks" Mobility   Outcome Measure  Help needed turning from your back to your side while in a flat bed without using bedrails?: None Help needed moving from lying on your back to sitting on the side of a flat bed without using bedrails?: A Little Help needed moving to and from a bed to a chair (including a wheelchair)?: A Little Help needed standing up from a chair using your arms (e.g., wheelchair or bedside chair)?: A Little Help needed to walk in hospital room?: Total Help needed climbing 3-5 steps with a railing? : Total 6 Click Score: 15    End of Session   Activity Tolerance: Patient limited by pain Patient left: in bed;with bed alarm set;with call bell/phone within reach   PT Visit Diagnosis: Other abnormalities of gait and mobility (R26.89);Pain Pain - Right/Left: Right Pain - part of body: Leg     Time: [id number] PT Time Calculation (min) (ACUTE ONLY): 10  min  Charges:  $Therapeutic Activity: 8-22 mins                         Waunita Schooner, PT, DPT 05/27/2019, 3:29 PM

## 2019-05-27 NOTE — Progress Notes (Signed)
Patient sleepy but awake. Does not want to change his mind about surgery at this time. As per dr. Jess Barters notes MRI would not beneficial if patient is refusing surgery. Did not want to discuss things further.

## 2019-05-28 ENCOUNTER — Other Ambulatory Visit: Payer: Self-pay | Admitting: Physician Assistant

## 2019-05-28 LAB — CBC
HCT: 31.3 % — ABNORMAL LOW (ref 39.0–52.0)
Hemoglobin: 9.5 g/dL — ABNORMAL LOW (ref 13.0–17.0)
MCH: 25.6 pg — ABNORMAL LOW (ref 26.0–34.0)
MCHC: 30.4 g/dL (ref 30.0–36.0)
MCV: 84.4 fL (ref 80.0–100.0)
Platelets: 246 10*3/uL (ref 150–400)
RBC: 3.71 MIL/uL — ABNORMAL LOW (ref 4.22–5.81)
RDW: 16.9 % — ABNORMAL HIGH (ref 11.5–15.5)
WBC: 6.7 10*3/uL (ref 4.0–10.5)
nRBC: 0 % (ref 0.0–0.2)

## 2019-05-28 LAB — BASIC METABOLIC PANEL
Anion gap: 8 (ref 5–15)
BUN: 28 mg/dL — ABNORMAL HIGH (ref 6–20)
CO2: 26 mmol/L (ref 22–32)
Calcium: 8.5 mg/dL — ABNORMAL LOW (ref 8.9–10.3)
Chloride: 103 mmol/L (ref 98–111)
Creatinine, Ser: 1.73 mg/dL — ABNORMAL HIGH (ref 0.61–1.24)
GFR calc Af Amer: 49 mL/min — ABNORMAL LOW (ref >60)
GFR calc non Af Amer: 42 mL/min — ABNORMAL LOW (ref >60)
Glucose, Bld: 301 mg/dL — ABNORMAL HIGH (ref 70–99)
Potassium: 4.1 mmol/L (ref 3.5–5.1)
Sodium: 137 mmol/L (ref 135–145)

## 2019-05-28 LAB — SURGICAL PCR SCREEN
MRSA, PCR: POSITIVE — AB
Staphylococcus aureus: POSITIVE — AB

## 2019-05-28 LAB — GLUCOSE, CAPILLARY
Glucose-Capillary: 268 mg/dL — ABNORMAL HIGH (ref 70–99)
Glucose-Capillary: 220 mg/dL — ABNORMAL HIGH (ref 70–99)
Glucose-Capillary: 119 mg/dL — ABNORMAL HIGH (ref 70–99)
Glucose-Capillary: 194 mg/dL — ABNORMAL HIGH (ref 70–99)

## 2019-05-28 LAB — CULTURE, BLOOD (ROUTINE X 2)
Culture: NO GROWTH
Culture: NO GROWTH
Special Requests: ADEQUATE
Special Requests: ADEQUATE

## 2019-05-28 LAB — MAGNESIUM: Magnesium: 1.6 mg/dL — ABNORMAL LOW (ref 1.7–2.4)

## 2019-05-28 MED ORDER — CHLORHEXIDINE GLUCONATE 4 % EX LIQD
60.0000 mL | Freq: Once | CUTANEOUS | Status: DC
  Filled 2019-05-28: qty 60

## 2019-05-28 MED ORDER — MAGNESIUM OXIDE 400 (241.3 MG) MG PO TABS
800.0000 mg | ORAL_TABLET | Freq: Once | ORAL | Status: AC
  Administered 2019-05-28: 800 mg via ORAL
  Filled 2019-05-28: qty 2

## 2019-05-28 MED ORDER — CEFAZOLIN SODIUM-DEXTROSE 2-4 GM/100ML-% IV SOLN
2.0000 g | INTRAVENOUS | Status: DC
  Filled 2019-05-28: qty 100

## 2019-05-28 NOTE — Progress Notes (Signed)
Spoke with patient this morning. He is agreeable to Revision of the AKA with [name]. Will post for tomorrow.  Dr [name] will see patient tomorrow morning on rounds prior to surgery per patients request

## 2019-05-28 NOTE — Progress Notes (Signed)
Inpatient Diabetes Program Recommendations  AACE/ADA: New Consensus Statement on Inpatient Glycemic Control (2015)  Target Ranges:  Prepandial:   less than 140 mg/dL      Peak postprandial:   less than 180 mg/dL (1-2 hours)      Critically ill patients:  140 - 180 mg/dL   Lab Results  Component Value Date   GLUCAP 268 (H) 05/28/2019   HGBA1C 8.6 (H) 05/23/2019    Review of Glycemic Control  Diabetes history: DM 2 Outpatient Diabetes medications: Glipizide 10 mg Daily, Humalog 0-10 units tid before meals Current orders for Inpatient glycemic control:  Lantus 22 units Novolog 0-9 units tid + hs Novolog 5 units tid meal coverage  Inpatient Diabetes Program Recommendations:    First dose Lantus 22 units given this am. Will monitor trends for today.  Thanks,  Tama Headings RN, MSN, BC-ADM Inpatient Diabetes Coordinator Team Pager [phone] (8a-5p)

## 2019-05-28 NOTE — Progress Notes (Signed)
PROGRESS NOTE    Gregory [name]  [name] DOB: 01-10-1961 DOA: 05/22/2019 PCP: [name], MD   Brief Narrative:  58 year old with history of right AKA with chronic stump infection, multifocal osteomyelitis, chronic pain syndrome, chronic diastolic congestive heart failure, CKD stage III, COPD, DM 2, HTN, HLD came to the hospital with complains of right leg pain with some drainage around that site.  Upon admission he was noted to be febrile with elevated WBC with concerns of infection of the right lower extremity stump.  X-ray was negative for any bony involvement.  Orthopedic, Dr. Lorin Mercy was consulted. Ortho plans for revision of right aka.  Hesitant about surgical intervention.  ID consulted for antibiotic management.  Plastic surgery consulted for second opinion at the request of the patient who agreed with the assessment as well.   Assessment & Plan:   Principal Problem:   Amputation stump infection ([place]) Active Problems:   Type II diabetes mellitus with neurological manifestations ([place])   CKD (chronic kidney disease), stage III   Tobacco use   Sepsis ([place])   Sepsis secondary to infected right femoral stump/osteomyelitis/status post right AKA with chronic ulcer -Sepsis physiology is improved.  Continue to follow culture data.  Plans for revision of AKA tomorrow. -Appreciate input from plastic surgery for second opinion. -Continue IV vancomycin and Rocephin.  Closely monitor renal function -Pain control, bowel regimen -ID following for antibiotic management -No need for MRI -Sed rate/CRP-elevated  Chronic left lower extremity lymphedema with skin changes -Compression stockings and supportive care  Acute kidney injury on CKD stage IIIb, resolved -Creatinine baseline at 1.7.    Diabetes mellitus type 2, poorly controlled secondary to hyperglycemia -Insulin sliding scale and Accu-Chek -Lantus 22 units with premeal insulin.  Diabetic coordinator following. -A1c-8.6  Essential hypertension -Norvasc 10 mg daily.  Metoprolol 50 mg daily.  Chronic diastolic congestive heart failure -Clinically appears to be euvolemic.  Does have chronic lower extremity edema.  Anemia of chronic disease -Baseline hemoglobin around 11.  No obvious evidence of bleeding. Cont Iron Supplements.   HLD= Cont Lipitor.   DVT prophylaxis: Lovenox Code Status: Full code Family Communication: None Disposition Plan: Maintain hospital stay for amputation scheduled for tomorrow.  In the meantime receiving IV antibiotics.  Consultants:   Orthopedic  Infectious disease  Plastic surgery for second opinion regarding surgery at the request of patient  Procedures:   None   Antimicrobials:   IV Vanc, Roc   Subjective: Reporting of right lower extremity stump pain but much better with addition of long-acting pain medication.  Review of Systems Otherwise negative except as per HPI, including: General = no fevers, chills, dizziness, malaise, fatigue HEENT/EYES = negative for pain, redness, loss of vision, double vision, blurred vision, loss of hearing, sore throat, hoarseness, dysphagia Cardiovascular= negative for chest pain, palpitation, murmurs, lower extremity swelling Respiratory/lungs= negative for shortness of breath, cough, hemoptysis, wheezing, mucus production Gastrointestinal= negative for nausea, vomiting,, abdominal pain, melena, hematemesis Genitourinary= negative for Dysuria, Hematuria, Change in Urinary Frequency MSK = right lower extremity stump pain Neurology= Negative for headache, seizures, numbness, tingling  Psychiatry= Negative for anxiety, depression, suicidal and homocidal ideation Allergy/Immunology= Medication/Food allergy as listed  Skin= Negative for Rash, lesions, ulcers, itching  Objective: Vitals:   05/27/19 1634 05/27/19 2047 05/28/19 0437 05/28/19 0934  BP: (!) 130/55 (!) 148/65 (!) 141/70 139/73  Pulse: 66 (!) 58 (!) 55 (!) 57  Resp:  18 16 16 16   Temp: 98 F (36.7 C) 97.8  F (36.6 C) 98.1 F (36.7 C) 97.8 F (36.6 C)  TempSrc: Oral Oral Oral   SpO2:  96% 93% 95%  Weight:      Height:        Intake/Output Summary (Last 24 hours) at 05/28/2019 1123 Last data filed at 05/28/2019 0900 Gross per 24 hour  Intake 1749.05 ml  Output 1550 ml  Net 199.05 ml   Filed Weights   05/22/19 1701 05/26/19 0426 05/27/19 0512  Weight: 113.4 kg 110.9 kg 110.4 kg    Examination: Constitutional: Not in acute distress Respiratory: Clear to auscultation bilaterally Cardiovascular: Normal sinus rhythm, no rubs Abdomen: Nontender nondistended good bowel sounds Musculoskeletal: Right sided AKA Skin: Chronic skin changes in the left lower extremity.  Right lower extremity AKA with mild drainage.  Previous incision scar noted. Neurologic: CN 2-12 grossly intact.  And nonfocal Psychiatric: Normal judgment and insight. Alert and oriented x 3. Normal mood.   Data Reviewed:   CBC: Recent Labs  Lab 05/22/19 1758 05/24/19 0537 05/25/19 0538 05/26/19 0602 05/27/19 0227 05/28/19 0515  WBC 21.9* 12.3* 6.8 6.1 4.9 6.7  NEUTROABS 20.6*  --   --   --   --   --   HGB 11.8* 10.8* 9.2* 10.1* 10.0* 9.5*  HCT 37.8* 35.4* 30.1* 32.7* 32.9* 31.3*  MCV 83.4 85.3 84.1 83.8 84.6 84.4  PLT 256 175 168 211 216 0000000   Basic Metabolic Panel: Recent Labs  Lab 05/22/19 1758 05/25/19 0538 05/26/19 0602 05/27/19 0227 05/28/19 0515  NA 135 137 136 137 137  K 4.4 3.7 3.8 3.9 4.1  CL 100 103 99 99 103  CO2 23 24 25 26 26   GLUCOSE 209* 252* 218* 265* 301*  BUN 21* 38* 36* 32* 28*  CREATININE 1.79* 2.65* 2.11* 1.78* 1.73*  CALCIUM 9.0 8.1* 8.7* 8.8* 8.5*  MG  --   --  1.7 1.8 1.6*   GFR: Estimated Creatinine Clearance: 53.6 mL/min (A) (by C-G formula based on SCr of 1.73 mg/dL (H)). Liver Function Tests: Recent Labs  Lab 05/22/19 1758  AST 13*  ALT 12  ALKPHOS 90  BILITOT 0.5  PROT 7.4  ALBUMIN 3.3*   No results for input(s):  LIPASE, AMYLASE in the last 168 hours. No results for input(s): AMMONIA in the last 168 hours. Coagulation Profile: No results for input(s): INR, PROTIME in the last 168 hours. Cardiac Enzymes: No results for input(s): CKTOTAL, CKMB, CKMBINDEX, TROPONINI in the last 168 hours. BNP (last 3 results) No results for input(s): PROBNP in the last 8760 hours. HbA1C: No results for input(s): HGBA1C in the last 72 hours. CBG: Recent Labs  Lab 05/27/19 0655 05/27/19 1128 05/27/19 1615 05/27/19 2219 05/28/19 0628  GLUCAP 246* 187* 74 220* 268*   Lipid Profile: No results for input(s): CHOL, HDL, LDLCALC, TRIG, CHOLHDL, LDLDIRECT in the last 72 hours. Thyroid Function Tests: No results for input(s): TSH, T4TOTAL, FREET4, T3FREE, THYROIDAB in the last 72 hours. Anemia Panel: No results for input(s): VITAMINB12, FOLATE, FERRITIN, TIBC, IRON, RETICCTPCT in the last 72 hours. Sepsis Labs: Recent Labs  Lab 05/22/19 1758 05/23/19 0116  LATICACIDVEN 2.1* 1.9    Recent Results (from the past 240 hour(s))  Culture, blood (Routine x 2)     Status: None   Collection Time: 05/22/19 10:00 PM   Specimen: BLOOD  Result Value Ref Range Status   Specimen Description BLOOD LEFT ARM  Final   Special Requests   Final    BOTTLES DRAWN AEROBIC  AND ANAEROBIC Blood Culture adequate volume   Culture   Final    NO GROWTH 5 DAYS Performed at [facility], [place] [address] [place], [place] [address]    Report Status 05/28/2019 FINAL  Final  Culture, blood (Routine x 2)     Status: None   Collection Time: 05/22/19 10:30 PM   Specimen: BLOOD  Result Value Ref Range Status   Specimen Description BLOOD RIGHT ARM  Final   Special Requests   Final    BOTTLES DRAWN AEROBIC ONLY Blood Culture adequate volume   Culture   Final    NO GROWTH 5 DAYS Performed at [facility], [place] [address] [place], [place] [address]    Report Status 05/28/2019 FINAL  Final  SARS CORONAVIRUS 2 (TAT 6-24 HRS)  Nasopharyngeal Nasopharyngeal Swab     Status: None   Collection Time: 05/23/19 12:56 AM   Specimen: Nasopharyngeal Swab  Result Value Ref Range Status   SARS Coronavirus 2 NEGATIVE NEGATIVE Final    Comment: (NOTE) SARS-CoV-2 target nucleic acids are NOT DETECTED. The SARS-CoV-2 RNA is generally detectable in upper and lower respiratory specimens during the acute phase of infection. Negative results do not preclude SARS-CoV-2 infection, do not rule out co-infections with other pathogens, and should not be used as the sole basis for treatment or other patient management decisions. Negative results must be combined with clinical observations, patient history, and epidemiological information. The expected result is Negative. Fact Sheet for Patients: SugarRoll.be Fact Sheet for Healthcare Providers: https://www.woods-mathews.com/ This test is not yet approved or cleared by the [place] FDA and  has been authorized for detection and/or diagnosis of SARS-CoV-2 by FDA under an Emergency Use Authorization (EUA). This EUA will remain  in effect (meaning this test can be used) for the duration of the COVID-19 declaration under Section 56 4(b)(1) of the Act, 21 U.S.C. section 360bbb-3(b)(1), unless the authorization is terminated or revoked sooner. Performed at [facility], [place] [address] [place], [place] [address]   Urine culture     Status: Abnormal   Collection Time: 05/23/19 12:56 AM   Specimen: In/Out Cath Urine  Result Value Ref Range Status   Specimen Description IN/OUT CATH URINE  Final   Special Requests   Final    NONE Performed at [facility], [place] [address] [place], [place] [address]    Culture MULTIPLE SPECIES PRESENT, SUGGEST RECOLLECTION (A)  Final   Report Status 05/24/2019 FINAL  Final  MRSA PCR Screening     Status: Abnormal   Collection Time: 05/26/19  8:50 AM   Specimen: Nasopharyngeal  Result Value Ref  Range Status   MRSA by PCR POSITIVE (A) NEGATIVE Final    Comment:        The GeneXpert MRSA Assay (FDA approved for NASAL specimens only), is one component of a comprehensive MRSA colonization surveillance program. It is not intended to diagnose MRSA infection nor to guide or monitor treatment for MRSA infections. RESULT CALLED TO, READ BACK BY AND VERIFIED WITH: Tawni Levy RN 10:40 05/26/19 ([email or web address]) Performed at [facility], [place] [address] [place], [place] [address]          Radiology Studies: No results found.      Scheduled Meds: . amLODipine  10 mg Oral Daily  . atorvastatin  20 mg Oral QHS  . buPROPion  300 mg Oral QHS  . Chlorhexidine Gluconate Cloth  6 each Topical Q0600  .  enoxaparin (LOVENOX) injection  40 mg Subcutaneous Q24H  . ferrous sulfate  325 mg Oral Q breakfast  . finasteride  5 mg Oral Daily  . influenza vac split quadrivalent PF  0.5 mL Intramuscular Tomorrow-1000  . insulin aspart  0-5 Units Subcutaneous QHS  . insulin aspart  0-9 Units Subcutaneous TID WC  . insulin aspart  5 Units Subcutaneous TID WC  . insulin glargine  22 Units Subcutaneous Daily  . metoprolol tartrate  50 mg Oral Daily  . mirabegron ER  50 mg Oral Daily  . mupirocin ointment  1 application Nasal BID  . nicotine  14 mg Transdermal Daily  . oxybutynin  15 mg Oral QHS  . oxyCODONE  15 mg Oral Q12H  . oxymetazoline  2 spray Each Nare BID  . pantoprazole  40 mg Oral Daily  . PARoxetine  10 mg Oral QHS  . pregabalin  150 mg Oral BID  . senna-docusate  2 tablet Oral QHS  . tamsulosin  0.8 mg Oral QPM  . terbinafine  250 mg Oral Daily  . umeclidinium bromide  1 puff Inhalation Daily  . vitamin B-12  1,000 mcg Oral Daily   Continuous Infusions: . sodium chloride 250 mL (05/24/19 0551)  . cefTRIAXone (ROCEPHIN)  IV 2 g (05/27/19 2108)  . vancomycin 1,250 mg (05/28/19 0948)     LOS: 5 days   Time spent= 35 mins    Deidrea Gaetz Arsenio Loader, MD [facility]   If 7PM-7AM, please contact night-coverage  05/28/2019, 11:23 AM

## 2019-05-28 NOTE — Progress Notes (Signed)
ID PROGRESS NOTE   58yo M with Stump infection. Continue with vancomycin and ceftriaxone. Will likely revise abtx recommendations pending his surgery tomorrow. I have asked dr duda to send OR Cx to help narrow abtx course.  Elzie Rings [facility] [phone]

## 2019-05-29 ENCOUNTER — Inpatient Hospital Stay (HOSPITAL_COMMUNITY): Payer: Medicaid Other | Admitting: Anesthesiology

## 2019-05-29 ENCOUNTER — Encounter (HOSPITAL_COMMUNITY)
Admission: EM | Disposition: A | Discharge status: Skilled Nursing Facility | Payer: Self-pay | Source: Home / Self Care | Attending: Internal Medicine

## 2019-05-29 ENCOUNTER — Encounter (HOSPITAL_COMMUNITY): Payer: Self-pay | Admitting: Internal Medicine

## 2019-05-29 DIAGNOSIS — M86451 Chronic osteomyelitis with draining sinus, right femur: Secondary | ICD-10-CM

## 2019-05-29 HISTORY — PX: STUMP REVISION: SHX6102

## 2019-05-29 LAB — BASIC METABOLIC PANEL
Anion gap: 10 (ref 5–15)
BUN: 26 mg/dL — ABNORMAL HIGH (ref 6–20)
CO2: 24 mmol/L (ref 22–32)
Calcium: 8.6 mg/dL — ABNORMAL LOW (ref 8.9–10.3)
Chloride: 104 mmol/L (ref 98–111)
Creatinine, Ser: 1.38 mg/dL — ABNORMAL HIGH (ref 0.61–1.24)
GFR calc Af Amer: >60 mL/min (ref >60)
GFR calc non Af Amer: 56 mL/min — ABNORMAL LOW (ref >60)
Glucose, Bld: 227 mg/dL — ABNORMAL HIGH (ref 70–99)
Potassium: 4.3 mmol/L (ref 3.5–5.1)
Sodium: 138 mmol/L (ref 135–145)

## 2019-05-29 LAB — CBC
HCT: 34.5 % — ABNORMAL LOW (ref 39.0–52.0)
Hemoglobin: 10.3 g/dL — ABNORMAL LOW (ref 13.0–17.0)
MCH: 25.6 pg — ABNORMAL LOW (ref 26.0–34.0)
MCHC: 29.9 g/dL — ABNORMAL LOW (ref 30.0–36.0)
MCV: 85.6 fL (ref 80.0–100.0)
Platelets: 260 10*3/uL (ref 150–400)
RBC: 4.03 MIL/uL — ABNORMAL LOW (ref 4.22–5.81)
RDW: 16.9 % — ABNORMAL HIGH (ref 11.5–15.5)
WBC: 7.4 10*3/uL (ref 4.0–10.5)
nRBC: 0 % (ref 0.0–0.2)

## 2019-05-29 LAB — GLUCOSE, CAPILLARY
Glucose-Capillary: 194 mg/dL — ABNORMAL HIGH (ref 70–99)
Glucose-Capillary: 197 mg/dL — ABNORMAL HIGH (ref 70–99)
Glucose-Capillary: 219 mg/dL — ABNORMAL HIGH (ref 70–99)
Glucose-Capillary: 255 mg/dL — ABNORMAL HIGH (ref 70–99)
Glucose-Capillary: 301 mg/dL — ABNORMAL HIGH (ref 70–99)

## 2019-05-29 LAB — MAGNESIUM: Magnesium: 1.7 mg/dL (ref 1.7–2.4)

## 2019-05-29 SURGERY — REVISION, AMPUTATION SITE
Anesthesia: General | Laterality: Right

## 2019-05-29 MED ORDER — FENTANYL CITRATE (PF) 250 MCG/5ML IJ SOLN
INTRAMUSCULAR | Status: AC
  Filled 2019-05-29: qty 5

## 2019-05-29 MED ORDER — MIDAZOLAM HCL 2 MG/2ML IJ SOLN
INTRAMUSCULAR | Status: DC | PRN
  Administered 2019-05-29: 2 mg via INTRAVENOUS

## 2019-05-29 MED ORDER — ACETAMINOPHEN 325 MG PO TABS: 325.0000 mg | ORAL_TABLET | ORAL | Status: DC | PRN

## 2019-05-29 MED ORDER — MIDAZOLAM HCL 2 MG/2ML IJ SOLN
INTRAMUSCULAR | Status: AC
  Administered 2019-05-29: 09:00:00 2 mg via INTRAVENOUS
  Filled 2019-05-29: qty 2

## 2019-05-29 MED ORDER — ONDANSETRON HCL 4 MG PO TABS: 4.0000 mg | ORAL_TABLET | Freq: Four times a day (QID) | ORAL | Status: DC | PRN

## 2019-05-29 MED ORDER — FENTANYL CITRATE (PF) 100 MCG/2ML IJ SOLN
INTRAMUSCULAR | Status: AC
  Filled 2019-05-29: qty 2

## 2019-05-29 MED ORDER — MORPHINE SULFATE (PF) 4 MG/ML IV SOLN
4.0000 mg | INTRAVENOUS | Status: DC | PRN
  Administered 2019-05-29 – 2019-05-31 (×8): 4 mg via INTRAVENOUS
  Filled 2019-05-29 (×9): qty 1

## 2019-05-29 MED ORDER — ONDANSETRON HCL 4 MG/2ML IJ SOLN
INTRAMUSCULAR | Status: DC | PRN
  Administered 2019-05-29: 4 mg via INTRAVENOUS

## 2019-05-29 MED ORDER — INSULIN GLARGINE 100 UNIT/ML ~~LOC~~ SOLN
28.0000 [IU] | Freq: Every day | SUBCUTANEOUS | Status: DC
  Administered 2019-05-30 – 2019-05-31 (×2): 28 [IU] via SUBCUTANEOUS
  Filled 2019-05-29 (×2): qty 0.28

## 2019-05-29 MED ORDER — DEXAMETHASONE SODIUM PHOSPHATE 10 MG/ML IJ SOLN
INTRAMUSCULAR | Status: DC | PRN
  Administered 2019-05-29: 5 mg via INTRAVENOUS

## 2019-05-29 MED ORDER — PROPOFOL 10 MG/ML IV BOLUS
INTRAVENOUS | Status: DC | PRN
  Administered 2019-05-29: 140 mg via INTRAVENOUS

## 2019-05-29 MED ORDER — 0.9 % SODIUM CHLORIDE (POUR BTL) OPTIME
TOPICAL | Status: DC | PRN
  Administered 2019-05-29: 10:00:00 1000 mL

## 2019-05-29 MED ORDER — METOCLOPRAMIDE HCL 5 MG PO TABS: 5.0000 mg | ORAL_TABLET | Freq: Three times a day (TID) | ORAL | Status: DC | PRN

## 2019-05-29 MED ORDER — LACTATED RINGERS IV SOLN
INTRAVENOUS | Status: DC
  Administered 2019-05-29: 09:00:00 via INTRAVENOUS

## 2019-05-29 MED ORDER — MIDAZOLAM HCL 2 MG/2ML IJ SOLN
INTRAMUSCULAR | Status: AC
  Filled 2019-05-29: qty 2

## 2019-05-29 MED ORDER — HYDROMORPHONE HCL 1 MG/ML IJ SOLN
1.0000 mg | Freq: Once | INTRAMUSCULAR | Status: AC
  Administered 2019-05-29: 1 mg via INTRAVENOUS
  Filled 2019-05-29: qty 1

## 2019-05-29 MED ORDER — ACETAMINOPHEN 160 MG/5ML PO SOLN: 325.0000 mg | ORAL | Status: DC | PRN

## 2019-05-29 MED ORDER — ONDANSETRON HCL 4 MG/2ML IJ SOLN
4.0000 mg | Freq: Four times a day (QID) | INTRAMUSCULAR | Status: DC | PRN
  Administered 2019-05-31: 4 mg via INTRAVENOUS
  Filled 2019-05-29: qty 2

## 2019-05-29 MED ORDER — OXYCODONE HCL 5 MG/5ML PO SOLN: 5.0000 mg | Freq: Once | ORAL | Status: DC | PRN

## 2019-05-29 MED ORDER — OXYCODONE HCL 5 MG PO TABS: 5.0000 mg | ORAL_TABLET | Freq: Once | ORAL | Status: DC | PRN

## 2019-05-29 MED ORDER — MEPERIDINE HCL 25 MG/ML IJ SOLN: 6.2500 mg | INTRAMUSCULAR | Status: DC | PRN

## 2019-05-29 MED ORDER — MIDAZOLAM HCL 2 MG/2ML IJ SOLN: 2.0000 mg | Freq: Once | INTRAMUSCULAR | Status: AC

## 2019-05-29 MED ORDER — ONDANSETRON HCL 4 MG/2ML IJ SOLN: 4.0000 mg | Freq: Once | INTRAMUSCULAR | Status: DC | PRN

## 2019-05-29 MED ORDER — PROPOFOL 10 MG/ML IV BOLUS
INTRAVENOUS | Status: AC
  Filled 2019-05-29: qty 80

## 2019-05-29 MED ORDER — LIDOCAINE 2% (20 MG/ML) 5 ML SYRINGE
INTRAMUSCULAR | Status: DC | PRN
  Administered 2019-05-29: 80 mg via INTRAVENOUS

## 2019-05-29 MED ORDER — OXYCODONE HCL 5 MG PO TABS
ORAL_TABLET | ORAL | Status: AC
  Filled 2019-05-29: qty 4

## 2019-05-29 MED ORDER — FENTANYL CITRATE (PF) 100 MCG/2ML IJ SOLN
25.0000 ug | INTRAMUSCULAR | Status: DC | PRN
  Administered 2019-05-29: 100 ug via INTRAVENOUS
  Administered 2019-05-29: 11:00:00 50 ug via INTRAVENOUS

## 2019-05-29 MED ORDER — FENTANYL CITRATE (PF) 100 MCG/2ML IJ SOLN
INTRAMUSCULAR | Status: DC | PRN
  Administered 2019-05-29 (×5): 50 ug via INTRAVENOUS

## 2019-05-29 MED ORDER — METOCLOPRAMIDE HCL 5 MG/ML IJ SOLN: 5.0000 mg | Freq: Three times a day (TID) | INTRAMUSCULAR | Status: DC | PRN

## 2019-05-29 MED ORDER — DOCUSATE SODIUM 100 MG PO CAPS
100.0000 mg | ORAL_CAPSULE | Freq: Two times a day (BID) | ORAL | Status: DC
  Administered 2019-05-29 – 2019-05-31 (×5): 100 mg via ORAL
  Filled 2019-05-29 (×5): qty 1

## 2019-05-29 SURGICAL SUPPLY — 30 items
BLADE SAW RECIP 87.9 MT (BLADE) ×2 IMPLANT
BLADE SURG 21 STRL SS (BLADE) ×3 IMPLANT
CANISTER WOUND CARE 500ML ATS (WOUND CARE) ×3 IMPLANT
COVER SURGICAL LIGHT HANDLE (MISCELLANEOUS) ×3 IMPLANT
DRAPE EXTREMITY T 121X128X90 (DISPOSABLE) ×3 IMPLANT
DRAPE HALF SHEET 40X57 (DRAPES) ×3 IMPLANT
DRAPE INCISE IOBAN 66X45 STRL (DRAPES) ×3 IMPLANT
DRAPE U-SHAPE 47X51 STRL (DRAPES) ×6 IMPLANT
DRESSING PREVENA PLUS CUSTOM (GAUZE/BANDAGES/DRESSINGS) IMPLANT
DRSG PREVENA PLUS CUSTOM (GAUZE/BANDAGES/DRESSINGS) ×3
DURAPREP 26ML APPLICATOR (WOUND CARE) ×3 IMPLANT
ELECT REM PT RETURN 9FT ADLT (ELECTROSURGICAL) ×3
ELECTRODE REM PT RTRN 9FT ADLT (ELECTROSURGICAL) ×1 IMPLANT
GLOVE BIOGEL PI IND STRL 9 (GLOVE) ×1 IMPLANT
GLOVE BIOGEL PI INDICATOR 9 (GLOVE) ×2
GLOVE SURG ORTHO 9.0 STRL STRW (GLOVE) ×3 IMPLANT
GOWN STRL REUS W/ TWL XL LVL3 (GOWN DISPOSABLE) ×2 IMPLANT
GOWN STRL REUS W/TWL XL LVL3 (GOWN DISPOSABLE) ×6
KIT BASIN OR (CUSTOM PROCEDURE TRAY) ×3 IMPLANT
KIT TURNOVER KIT B (KITS) ×3 IMPLANT
MANIFOLD NEPTUNE II (INSTRUMENTS) ×3 IMPLANT
NS IRRIG 1000ML POUR BTL (IV SOLUTION) ×3 IMPLANT
PACK GENERAL/GYN (CUSTOM PROCEDURE TRAY) ×3 IMPLANT
PAD ARMBOARD 7.5X6 YLW CONV (MISCELLANEOUS) ×3 IMPLANT
SPONGE LAP 18X18 RF (DISPOSABLE) ×2 IMPLANT
SUT ETHILON 2 0 PSLX (SUTURE) ×8 IMPLANT
TOWEL GREEN STERILE (TOWEL DISPOSABLE) ×3 IMPLANT
TUBE CONNECTING 20'X1/4 (TUBING) ×1
TUBE CONNECTING 20X1/4 (TUBING) ×1 IMPLANT
YANKAUER SUCT BULB TIP NO VENT (SUCTIONS) ×2 IMPLANT

## 2019-05-29 NOTE — Progress Notes (Signed)
Inpatient Diabetes Program Recommendations  AACE/ADA: New Consensus Statement on Inpatient Glycemic Control (2015)  Target Ranges:  Prepandial:   less than 140 mg/dL      Peak postprandial:   less than 180 mg/dL (1-2 hours)      Critically ill patients:  140 - 180 mg/dL   Results for ANTWANN, WEIDERT (MRN [id number]) as of 05/29/2019 10:42  Ref. Range 05/28/2019 06:28 05/28/2019 11:58 05/28/2019 16:45 05/28/2019 20:45 05/29/2019 07:08  Glucose-Capillary Latest Ref Range: 70 - 99 mg/dL 268 (H) 220 (H) 119 (H) 194 (H) 194 (H)   Review of Glycemic Control  Diabetes history: DM 2 Outpatient Diabetes medications: Glipizide 10 mg Daily, Humalog 0-10 units tid before meals Current orders for Inpatient glycemic control:  Lantus 22 units Novolog 0-9 units tid + hs Novolog 5 units tid meal coverage  A1c 8.6%  Inpatient Diabetes Program Recommendations:    Glucose trends slightly better. Decadron 5 mg given today.   Fasting glucose 190's. Consider increasing Lantus to 28 units.  Thanks,  Tama Headings RN, MSN, BC-ADM Inpatient Diabetes Coordinator Team Pager [phone] (8a-5p)

## 2019-05-29 NOTE — Transfer of Care (Signed)
Immediate Anesthesia Transfer of Care Note  Patient: Gregory [name]  Procedure(s) Performed: REVISION RIGHT ABOVE KNEE AMPUTATION (Right )  Patient Location: PACU  Anesthesia Type:General  Level of Consciousness: awake  Airway & Oxygen Therapy: Patient Spontanous Breathing and Patient connected to face mask oxygen  Post-op Assessment: Report given to RN and Post -op Vital signs reviewed and stable  Post vital signs: Reviewed and stable  Last Vitals:  Vitals Value Taken Time  BP 118/74 05/29/19 1040  Temp    Pulse 55 05/29/19 1041  Resp 19 05/29/19 1041  SpO2 99 % 05/29/19 1041  Vitals shown include unvalidated device data.  Last Pain:  Vitals:   05/29/19 0640  TempSrc:   PainSc: Asleep      Patients Stated Pain Goal: 2 ([id number])  Complications: No apparent anesthesia complications

## 2019-05-29 NOTE — Progress Notes (Signed)
Patient removed his wound Vac and states he can't take it anymore. he wants . MD  And Ortho notify.

## 2019-05-29 NOTE — Op Note (Signed)
05/29/2019  10:39 AM  PATIENT:  Gregory [name]    PRE-OPERATIVE DIAGNOSIS:  Osteomyelitis Right Above Knee Amputation  POST-OPERATIVE DIAGNOSIS:  Same  PROCEDURE:  REVISION RIGHT ABOVE KNEE AMPUTATION Application of [name] customizable and arthroform VAC.  SURGEON:  [name], MD  PHYSICIAN ASSISTANT:None ANESTHESIA:   General  PREOPERATIVE INDICATIONS:  Gregory [name] is a  58 y.o. male with a diagnosis of Osteomyelitis Right Above Knee Amputation who failed conservative measures and elected for surgical management.    The risks benefits and alternatives were discussed with the patient preoperatively including but not limited to the risks of infection, bleeding, nerve injury, cardiopulmonary complications, the need for revision surgery, among others, and the patient was willing to proceed.  OPERATIVE IMPLANTS: [name] dressings x2  @ENCIMAGES @  OPERATIVE FINDINGS: No deep abscess.  Tissue sent for cultures including skin and soft tissue muscle and bone.  OPERATIVE PROCEDURE: Patient was brought to the operating room and underwent a general anesthetic.  After adequate levels anesthesia were obtained patient's right lower extremity was prepped using DuraPrep draped into a sterile field a timeout was called.  A fishmouth incision was made around the ulcerative tissue this was carried sharply down to bone.  The medial neurovascular bundles were clamped and suture ligated with 2-0 silk.  A reciprocating saw was used to resect the distal femur just proximal to the bony changes.  The bone skin soft tissue and ulcer were resected in 1 block of tissue.  Suture with 2-0 silk was used for hemostasis.  The wound was irrigated with normal saline.  The deep superficial fascia layers and skin was closed using 2-0 nylon.  A customizable Prevena wound VAC was applied to the incision this was covered with a arthroform dressing.  This had a good suction fit patient was extubated taken the PACU in  stable condition.   DISCHARGE PLANNING:  Antibiotic duration: Continue antibiotics as per infectious disease.  Tissue cultures sent skin soft tissue muscle fascia and bone.  Weightbearing: Nonweightbearing on the right  Pain medication: Continue current opioid pathway  Dressing care/ Wound VAC: Continue wound VAC for 1 week  Ambulatory devices: Walker for transfers  Discharge to: Anticipate discharge back to skilled nursing.  Follow-up: In the office 1 week post operative.

## 2019-05-29 NOTE — Progress Notes (Signed)
Pharmacy Antibiotic Note  Gregory [name] is a 58 y.o. male admitted on 05/22/2019 with wound infection with possible OM  Pharmacy has been consulted for Vancomycin dosing.  WBC elevated. Noted renal dysfunction. SCR 1.79>>2.65>2.11>>1.38  Patient to have revision of AKA today per [name]. ID also consulted who recommends continuing vancomycin and CTX pending surgery. WBC 7.4, afebrile    Plan: Continue vancomycin at 1250mg  IV q24h, AUC 527 for OM F/U post-op for changes in abx recommendations Drug levels as indicated   Height: 5\' 6"  (167.6 cm) Weight: 243 lb 6.2 oz (110.4 kg) IBW/kg (Calculated) : 63.8  Temp (24hrs), Avg:97.8 F (36.6 C), Min:97.6 F (36.4 C), Max:97.9 F (36.6 C)  Recent Labs  Lab 05/22/19 1758 05/23/19 0116 05/25/19 0538 05/25/19 1016 05/26/19 0602 05/27/19 0227 05/28/19 0515 05/29/19 0408  WBC 21.9*  --  6.8  --  6.1 4.9 6.7 7.4  CREATININE 1.79*  --  2.65*  --  2.11* 1.78* 1.73* 1.38*  LATICACIDVEN 2.1* 1.9  --   --   --   --   --   --   VANCOPEAK  --   --   --  26*  --   --   --   --   VANCORANDOM  --   --   --   --  16  --   --   --     Estimated Creatinine Clearance: 67.2 mL/min (A) (by C-G formula based on SCr of 1.38 mg/dL (H)).    Allergies  Allergen Reactions  . Latex Other (See Comments)    Listed on MAR- pt sts he is not allergic.    Viana Sleep A. Levada Dy, PharmD, BCPS, FNKF Clinical Pharmacist [place] Please utilize Amion for appropriate phone number to reach the unit pharmacist ([place])

## 2019-05-29 NOTE — Progress Notes (Signed)
PROGRESS NOTE    Gregory [name]  [name] DOB: July 29, 1960 DOA: 05/22/2019 PCP: [name], MD   Brief Narrative:  58 year old with history of right AKA with chronic stump infection, multifocal osteomyelitis, chronic pain syndrome, chronic diastolic congestive heart failure, CKD stage III, COPD, DM 2, HTN, HLD came to the hospital with complains of right leg pain with some drainage around that site.  Upon admission he was noted to be febrile with elevated WBC with concerns of infection of the right lower extremity stump.  X-ray was negative for any bony involvement.  Orthopedic, Dr. Lorin Mercy was consulted. Ortho plans for revision of right aka.  Hesitant about surgical intervention.  ID consulted for antibiotic management.  Plastic surgery consulted for second opinion at the request of the patient who agreed with the assessment as well.  Underwent revision of the right above-the-knee amputation with wound VAC placement.   Assessment & Plan:   Principal Problem:   Amputation stump infection ([place]) Active Problems:   Type II diabetes mellitus with neurological manifestations ([place])   CKD (chronic kidney disease), stage III   Tobacco use   Sepsis ([place])   Sepsis secondary to infected right femoral stump/osteomyelitis/status post right AKA with chronic ulcer -Sepsis physiology is improved.  Status post revision of right AKA 12/11. -Appreciate input from plastic surgery -Antibiotic per infectious disease. -Pain control, bowel regimen -PT OT following.  Maintain wound VAC in for 1 week and follow-up outpatient orthopedic -Pain control, bowel regimen.  Morphine increased due to significant amount of pain -Sed rate/CRP-elevated  Chronic left lower extremity lymphedema with skin changes -Compression stockings and supportive care  Acute kidney injury on CKD stage IIIb, resolved -Creatinine baseline at 1.7.    Diabetes mellitus type 2, poorly controlled secondary to  hyperglycemia -Insulin sliding scale and Accu-Chek -Lantus 22 units with premeal insulin.  Diabetic coordinator following. -A1c-8.6  Essential hypertension -Norvasc 10 mg daily.  Metoprolol 50 mg daily.  Chronic diastolic congestive heart failure -Clinically appears to be euvolemic.  Does have chronic lower extremity edema.  Anemia of chronic disease -Baseline hemoglobin around 11.  No obvious evidence of bleeding. Cont Iron Supplements.   HLD= Cont Lipitor.   DVT prophylaxis: Lovenox Code Status: Full code Family Communication: None Disposition Plan: Maintain hospital stay for amputation scheduled for tomorrow.  In the meantime receiving IV antibiotics.  Consultants:   Orthopedic  Infectious disease  Plastic surgery for second opinion regarding surgery at the request of patient  Procedures:  Revision of the right AKA 12/11  Antimicrobials:   IV Vanc, Roc   Subjective: Seen him postoperatively in his room complaining of significant amount of pain in the right stump at the revision site.  Review of Systems Otherwise negative except as per HPI, including: General = no fevers, chills, dizziness, malaise, fatigue HEENT/EYES = negative for pain, redness, loss of vision, double vision, blurred vision, loss of hearing, sore throat, hoarseness, dysphagia Cardiovascular= negative for chest pain, palpitation, murmurs, lower extremity swelling Respiratory/lungs= negative for shortness of breath, cough, hemoptysis, wheezing, mucus production Gastrointestinal= negative for nausea, vomiting,, abdominal pain, melena, hematemesis Genitourinary= negative for Dysuria, Hematuria, Change in Urinary Frequency MSK = right stump pain at the surgical site Neurology= Negative for headache, seizures, numbness, tingling  Psychiatry= Negative for anxiety, depression, suicidal and homocidal ideation Allergy/Immunology= Medication/Food allergy as listed  Skin= Negative for Rash, lesions,  ulcers, itching   Objective: Vitals:   05/29/19 1051 05/29/19 1058 05/29/19 1100 05/29/19 1137  BP:  Marland Kitchen)  153/86 (!) 153/86 (!) 149/77  Pulse:    (!) 54  Resp:    20  Temp:   (!) 97.3 F (36.3 C) 97.8 F (36.6 C)  TempSrc:    Oral  SpO2: 96%  96% 93%  Weight:      Height:        Intake/Output Summary (Last 24 hours) at 05/29/2019 1141 Last data filed at 05/29/2019 1019 Gross per 24 hour  Intake 240 ml  Output 2225 ml  Net -1985 ml   Filed Weights   05/27/19 0512 05/28/19 2046 05/29/19 0857  Weight: 110.4 kg 110.4 kg 110.4 kg    Examination: Constitutional: Mild distress due to significant amount of pain at the surgical site Respiratory: Clear to auscultation bilaterally Cardiovascular: Normal sinus rhythm, no rubs Abdomen: Nontender nondistended good bowel sounds Musculoskeletal: Right AKA noted Skin: Right AKA surgical site with wound VAC noted without any evidence of active bleeding.  Left lower extremities chronic skin changes Neurologic: CN 2-12 grossly intact.  And nonfocal Psychiatric: Normal judgment and insight. Alert and oriented x 3. Normal mood.    ata Reviewed:   CBC: Recent Labs  Lab 05/22/19 1758 05/25/19 0538 05/26/19 0602 05/27/19 0227 05/28/19 0515 05/29/19 0408  WBC 21.9* 6.8 6.1 4.9 6.7 7.4  NEUTROABS 20.6*  --   --   --   --   --   HGB 11.8* 9.2* 10.1* 10.0* 9.5* 10.3*  HCT 37.8* 30.1* 32.7* 32.9* 31.3* 34.5*  MCV 83.4 84.1 83.8 84.6 84.4 85.6  PLT 256 168 211 216 246 [id number]   Basic Metabolic Panel: Recent Labs  Lab 05/25/19 0538 05/26/19 0602 05/27/19 0227 05/28/19 0515 05/29/19 0408  NA 137 136 137 137 138  K 3.7 3.8 3.9 4.1 4.3  CL 103 99 99 103 104  CO2 24 25 26 26 24   GLUCOSE 252* 218* 265* 301* 227*  BUN 38* 36* 32* 28* 26*  CREATININE 2.65* 2.11* 1.78* 1.73* 1.38*  CALCIUM 8.1* 8.7* 8.8* 8.5* 8.6*  MG  --  1.7 1.8 1.6* 1.7   GFR: Estimated Creatinine Clearance: 67.2 mL/min (A) (by C-G formula based on SCr of 1.38  mg/dL (H)). Liver Function Tests: Recent Labs  Lab 05/22/19 1758  AST 13*  ALT 12  ALKPHOS 90  BILITOT 0.5  PROT 7.4  ALBUMIN 3.3*   No results for input(s): LIPASE, AMYLASE in the last 168 hours. No results for input(s): AMMONIA in the last 168 hours. Coagulation Profile: No results for input(s): INR, PROTIME in the last 168 hours. Cardiac Enzymes: No results for input(s): CKTOTAL, CKMB, CKMBINDEX, TROPONINI in the last 168 hours. BNP (last 3 results) No results for input(s): PROBNP in the last 8760 hours. HbA1C: No results for input(s): HGBA1C in the last 72 hours. CBG: Recent Labs  Lab 05/28/19 1645 05/28/19 2045 05/29/19 0708 05/29/19 1043 05/29/19 1135  GLUCAP 119* 194* 194* 197* 219*   Lipid Profile: No results for input(s): CHOL, HDL, LDLCALC, TRIG, CHOLHDL, LDLDIRECT in the last 72 hours. Thyroid Function Tests: No results for input(s): TSH, T4TOTAL, FREET4, T3FREE, THYROIDAB in the last 72 hours. Anemia Panel: No results for input(s): VITAMINB12, FOLATE, FERRITIN, TIBC, IRON, RETICCTPCT in the last 72 hours. Sepsis Labs: Recent Labs  Lab 05/22/19 1758 05/23/19 0116  LATICACIDVEN 2.1* 1.9    Recent Results (from the past 240 hour(s))  Culture, blood (Routine x 2)     Status: None   Collection Time: 05/22/19 10:00 PM   Specimen: BLOOD  Result Value Ref Range Status   Specimen Description BLOOD LEFT ARM  Final   Special Requests   Final    BOTTLES DRAWN AEROBIC AND ANAEROBIC Blood Culture adequate volume   Culture   Final    NO GROWTH 5 DAYS Performed at [facility], [address], [place], [place] [address]    Report Status 05/28/2019 FINAL  Final  Culture, blood (Routine x 2)     Status: None   Collection Time: 05/22/19 10:30 PM   Specimen: BLOOD  Result Value Ref Range Status   Specimen Description BLOOD RIGHT ARM  Final   Special Requests   Final    BOTTLES DRAWN AEROBIC ONLY Blood Culture adequate volume   Culture   Final    NO  GROWTH 5 DAYS Performed at [facility], [place] [address] [place], [place] [address]    Report Status 05/28/2019 FINAL  Final  SARS CORONAVIRUS 2 (TAT 6-24 HRS) Nasopharyngeal Nasopharyngeal Swab     Status: None   Collection Time: 05/23/19 12:56 AM   Specimen: Nasopharyngeal Swab  Result Value Ref Range Status   SARS Coronavirus 2 NEGATIVE NEGATIVE Final    Comment: (NOTE) SARS-CoV-2 target nucleic acids are NOT DETECTED. The SARS-CoV-2 RNA is generally detectable in upper and lower respiratory specimens during the acute phase of infection. Negative results do not preclude SARS-CoV-2 infection, do not rule out co-infections with other pathogens, and should not be used as the sole basis for treatment or other patient management decisions. Negative results must be combined with clinical observations, patient history, and epidemiological information. The expected result is Negative. Fact Sheet for Patients: SugarRoll.be Fact Sheet for Healthcare Providers: https://www.woods-mathews.com/ This test is not yet approved or cleared by the [place] FDA and  has been authorized for detection and/or diagnosis of SARS-CoV-2 by FDA under an Emergency Use Authorization (EUA). This EUA will remain  in effect (meaning this test can be used) for the duration of the COVID-19 declaration under Section 56 4(b)(1) of the Act, 21 U.S.C. section 360bbb-3(b)(1), unless the authorization is terminated or revoked sooner. Performed at [facility], [place] [address] [place], [place] [address]   Urine culture     Status: Abnormal   Collection Time: 05/23/19 12:56 AM   Specimen: In/Out Cath Urine  Result Value Ref Range Status   Specimen Description IN/OUT CATH URINE  Final   Special Requests   Final    NONE Performed at [facility], [place] [address] [place], [place] [address]    Culture MULTIPLE SPECIES PRESENT, SUGGEST RECOLLECTION (A)   Final   Report Status 05/24/2019 FINAL  Final  MRSA PCR Screening     Status: Abnormal   Collection Time: 05/26/19  8:50 AM   Specimen: Nasopharyngeal  Result Value Ref Range Status   MRSA by PCR POSITIVE (A) NEGATIVE Final    Comment:        The GeneXpert MRSA Assay (FDA approved for NASAL specimens only), is one component of a comprehensive MRSA colonization surveillance program. It is not intended to diagnose MRSA infection nor to guide or monitor treatment for MRSA infections. RESULT CALLED TO, READ BACK BY AND VERIFIED WITH: Tawni Levy RN 10:40 05/26/19 ([email or web address]) Performed at [facility], [place] [address] [place], [place] [address]   Surgical pcr screen     Status: Abnormal   Collection Time: 05/28/19  6:16 PM   Specimen: Nasal Mucosa; Nasal Swab  Result  Value Ref Range Status   MRSA, PCR POSITIVE (A) NEGATIVE Final    Comment: RESULT CALLED TO, READ BACK BY AND VERIFIED WITH: K LIPPARD RN 05/28/19 1948 JDW    Staphylococcus aureus POSITIVE (A) NEGATIVE Final    Comment: (NOTE) The Xpert SA Assay (FDA approved for NASAL specimens in patients 31 years of age and older), is one component of a comprehensive surveillance program. It is not intended to diagnose infection nor to guide or monitor treatment. Performed at [facility], [place] [address], [place], [place] [address]          Radiology Studies: No results found.      Scheduled Meds: . amLODipine  10 mg Oral Daily  . atorvastatin  20 mg Oral QHS  . buPROPion  300 mg Oral QHS  . Chlorhexidine Gluconate Cloth  6 each Topical Q0600  . enoxaparin (LOVENOX) injection  40 mg Subcutaneous Q24H  . fentaNYL      . fentaNYL      . ferrous sulfate  325 mg Oral Q breakfast  . finasteride  5 mg Oral Daily  . influenza vac split quadrivalent PF  0.5 mL Intramuscular Tomorrow-1000  . insulin aspart  0-5 Units Subcutaneous QHS  . insulin aspart  0-9 Units Subcutaneous TID WC  . insulin aspart  5  Units Subcutaneous TID WC  . insulin glargine  22 Units Subcutaneous Daily  . metoprolol tartrate  50 mg Oral Daily  . mirabegron ER  50 mg Oral Daily  . mupirocin ointment  1 application Nasal BID  . nicotine  14 mg Transdermal Daily  . oxybutynin  15 mg Oral QHS  . oxyCODONE      . oxyCODONE  15 mg Oral Q12H  . oxymetazoline  2 spray Each Nare BID  . pantoprazole  40 mg Oral Daily  . PARoxetine  10 mg Oral QHS  . pregabalin  150 mg Oral BID  . senna-docusate  2 tablet Oral QHS  . tamsulosin  0.8 mg Oral QPM  . terbinafine  250 mg Oral Daily  . umeclidinium bromide  1 puff Inhalation Daily  . vitamin B-12  1,000 mcg Oral Daily   Continuous Infusions: . sodium chloride 250 mL (05/24/19 0551)  . cefTRIAXone (ROCEPHIN)  IV 2 g (05/28/19 2132)  . vancomycin 1,250 mg (05/28/19 0948)     LOS: 6 days   Time spent= 35 mins    Evaleen Sant Arsenio Loader, MD [facility]  If 7PM-7AM, please contact night-coverage  05/29/2019, 11:41 AM

## 2019-05-29 NOTE — Anesthesia Procedure Notes (Signed)
Procedure Name: LMA Insertion Date/Time: 05/29/2019 9:53 AM Performed by: Imagene Riches, CRNA Pre-anesthesia Checklist: Patient identified, Emergency Drugs available, Suction available and Patient being monitored Patient Re-evaluated:Patient Re-evaluated prior to induction Oxygen Delivery Method: Circle System Utilized Preoxygenation: Pre-oxygenation with 100% oxygen Induction Type: IV induction Ventilation: Mask ventilation without difficulty LMA: LMA inserted LMA Size: 5.0 Number of attempts: 1 Airway Equipment and Method: Bite block Placement Confirmation: positive ETCO2 Tube secured with: Tape Dental Injury: Teeth and Oropharynx as per pre-operative assessment

## 2019-05-29 NOTE — Anesthesia Preprocedure Evaluation (Signed)
Anesthesia Evaluation  Patient identified by MRN, date of birth, ID band Patient awake    Reviewed: Allergy & Precautions, NPO status , Patient's Chart, lab work & pertinent test results  Airway Mallampati: III  TM Distance: >3 FB Neck ROM: Full    Dental no notable dental hx. (+) Edentulous Upper, Poor Dentition, Dental Advisory Given   Pulmonary COPD, Current Smoker and Patient abstained from smoking.,    Pulmonary exam normal breath sounds clear to auscultation       Cardiovascular hypertension, +CHF  Normal cardiovascular exam Rhythm:Regular Rate:Normal  Echo 11/20/15: Study Conclusions - Left ventricle: The cavity size was normal. There was mild concentric hypertrophy. Systolic function was normal. Theestimated ejection fraction was in the range of 55% to 60%. Doppler parameters are consistent with abnormal left ventricular relaxation (grade 1 diastolic dysfunction). - Pericardium, extracardiac: A small pericardial effusion was identified anterior to the heart. There was no evidence of hemodynamic compromise.  Carotid US 04/11/12: Summary: - No significant extracranial carotid artery stenosis demonstrated. Right vertebral could not be visualized due to thickness of the neck and respiratory interference. Left vertebral artery flow is antegrade - Technically difficult due to thickness of the neck, high bifurcations, and respiratory interference.   Neuro/Psych PSYCHIATRIC DISORDERS Depression negative neurological ROS     GI/Hepatic Neg liver ROS, GERD  ,  Endo/Other  negative endocrine ROSdiabetes, Type 2  Renal/GU Renal disease  negative genitourinary   Musculoskeletal  (+) Arthritis ,   Abdominal   Peds  Hematology  (+) Blood dyscrasia (Hgb 7.7), anemia ,   Anesthesia Other Findings   Reproductive/Obstetrics                             Anesthesia Physical  Anesthesia  Plan  ASA: III  Anesthesia Plan: General   Post-op Pain Management:    Induction: Intravenous  PONV Risk Score and Plan: 1 and Ondansetron, Midazolam and Dexamethasone  Airway Management Planned: LMA  Additional Equipment:   Intra-op Plan:   Post-operative Plan: Extubation in OR  Informed Consent: I have reviewed the patients History and Physical, chart, labs and discussed the procedure including the risks, benefits and alternatives for the proposed anesthesia with the patient or authorized representative who has indicated his/her understanding and acceptance.     Dental advisory given  Plan Discussed with: CRNA, Anesthesiologist and Surgeon  Anesthesia Plan Comments:         Anesthesia Quick Evaluation

## 2019-05-29 NOTE — Progress Notes (Signed)
Patient ID: Gregory [name], male   DOB: March 08, 1961, 58 y.o.   MRN: [id number] Patient received a high dose of pain medicine in the recovery room.  Upon return to the hospital floor patient demanded IV pain medicine.  He was informed that it was too soon for IV pain medication.  Patient became extremely agitated screaming cursing at the nurses and ripped off his wound VAC dressing.  Patient manipulated the wound with his hand and had blood all over his right hand.  Will have a dry dressing applied do not feel it safe to reapply the wound VAC at this time.  I attempted to call the patient's daughter at bedside but there was no answer.  Patient was calm and comfortable after our discussion.  Anticipate it should be safe from an orthopedic standpoint for patient to return to skilled nursing tomorrow.

## 2019-05-29 NOTE — Interval H&P Note (Signed)
History and Physical Interval Note:  05/29/2019 7:01 AM  Gregory [name]  has presented today for surgery, with the diagnosis of Osteomyelitis Right Above Knee Amputation.  The various methods of treatment have been discussed with the patient and family. After consideration of risks, benefits and other options for treatment, the patient has consented to  Procedure(s): REVISION RIGHT ABOVE KNEE AMPUTATION (Right) as a surgical intervention.  The patient's history has been reviewed, patient examined, no change in status, stable for surgery.  I have reviewed the patient's chart and labs.  Questions were answered to the patient's satisfaction.     [name]

## 2019-05-29 NOTE — Progress Notes (Signed)
PT Cancellation Note  Patient Details Name: Gregory [name] MRN: [id number] DOB: August 08, 1960   Cancelled Treatment:    Reason Eval/Treat Not Completed: Medical issues which prohibited therapy. Patient having AKA today. Will follow up with tomorrow.   [name] 05/29/2019, 8:45 AM

## 2019-05-29 NOTE — Consult Note (Addendum)
WOC Nurse Consult Note: WOC consult requested to reapply Vac dressing, prior to Dr [name] cancelling this order and providing topical treatment orders for the bedside nurses to apply dry dressings to the stump post-op wound.  The Vac has been discontinued after the patient removed it; refer to ortho service note.   Please re-consult if further assistance is needed.  Thank-you,  Julien Girt MSN, [place], [place], [place], [place]

## 2019-05-29 NOTE — Progress Notes (Signed)
[facility] for Infectious Disease    Date of Admission:  05/22/2019      ID: Gregory [name] is a 58 y.o. male with stump infection Principal Problem:   Amputation stump infection ([place]) Active Problems:   Type II diabetes mellitus with neurological manifestations ([place])   CKD (chronic kidney disease), stage III   Tobacco use   Sepsis ([place])    Subjective: Afebrile, no significant drainage from right stump. He is awaiting surgery  Medications:  . amLODipine  10 mg Oral Daily  . atorvastatin  20 mg Oral QHS  . buPROPion  300 mg Oral QHS  . Chlorhexidine Gluconate Cloth  6 each Topical Q0600  . docusate sodium  100 mg Oral BID  . enoxaparin (LOVENOX) injection  40 mg Subcutaneous Q24H  . fentaNYL      . fentaNYL      . ferrous sulfate  325 mg Oral Q breakfast  . finasteride  5 mg Oral Daily  . influenza vac split quadrivalent PF  0.5 mL Intramuscular Tomorrow-1000  . insulin aspart  0-5 Units Subcutaneous QHS  . insulin aspart  0-9 Units Subcutaneous TID WC  . insulin aspart  5 Units Subcutaneous TID WC  . [START ON 05/30/2019] insulin glargine  28 Units Subcutaneous Daily  . metoprolol tartrate  50 mg Oral Daily  . mirabegron ER  50 mg Oral Daily  . mupirocin ointment  1 application Nasal BID  . nicotine  14 mg Transdermal Daily  . oxybutynin  15 mg Oral QHS  . oxyCODONE      . oxyCODONE  15 mg Oral Q12H  . oxymetazoline  2 spray Each Nare BID  . pantoprazole  40 mg Oral Daily  . PARoxetine  10 mg Oral QHS  . pregabalin  150 mg Oral BID  . senna-docusate  2 tablet Oral QHS  . tamsulosin  0.8 mg Oral QPM  . terbinafine  250 mg Oral Daily  . umeclidinium bromide  1 puff Inhalation Daily  . vitamin B-12  1,000 mcg Oral Daily    Objective: Vital signs in last 24 hours: Temp:  [97.3 F (36.3 C)-97.9 F (36.6 C)] 97.9 F (36.6 C) (12/11 1648) Pulse Rate:  [35-57] 56 (12/11 1648) Resp:  [18-20] 20 (12/11 1648) BP: (118-153)/(69-86) 140/76 (12/11 1648) SpO2:   [92 %-98 %] 95 % (12/11 1648) Weight:  [110.4 kg] 110.4 kg (12/11 0857) Physical Exam  Constitutional: He is oriented to person, place, and time. He appears well-developed and well-nourished. No distress.  HENT:  Mouth/Throat: Oropharynx is clear and moist. No oropharyngeal exudate.  Cardiovascular: Normal rate, regular rhythm and normal heart sounds. Exam reveals no gallop and no friction rub.  No murmur heard.  Pulmonary/Chest: Effort normal and breath sounds normal. No respiratory distress. He has no wheezes.  Abdominal: Soft. Bowel sounds are normal. He exhibits no distension. There is no tenderness.  Ext: right BKA small eschar slight erythema Psychiatric: He has a normal mood and affect. His behavior is normal.     Lab Results Recent Labs    05/28/19 0515 05/29/19 0408  WBC 6.7 7.4  HGB 9.5* 10.3*  HCT 31.3* 34.5*  NA 137 138  K 4.1 4.3  CL 103 104  CO2 26 24  BUN 28* 26*  CREATININE 1.73* 1.38*   Sedimentation Rate Recent Labs    05/27/19 0227  ESRSEDRATE 102*   C-Reactive Protein Recent Labs    05/27/19 0227  CRP 10.2*  Microbiology: pending Studies/Results: No results found.   Assessment/Plan: Stump infection = no recent cultures to guide treatment. He is mrsa colonized. After revision. Recommend to discharge on doxy 100mg  bid plus amox/clav for mop up. Xray did not suggest osteo.  Will sign off  [facility], [place] for Infectious Diseases Cell: [phone] Pager: [phone]  05/29/2019, 6:18 PM

## 2019-05-30 LAB — BASIC METABOLIC PANEL
Anion gap: 12 (ref 5–15)
BUN: 31 mg/dL — ABNORMAL HIGH (ref 6–20)
CO2: 23 mmol/L (ref 22–32)
Calcium: 9 mg/dL (ref 8.9–10.3)
Chloride: 102 mmol/L (ref 98–111)
Creatinine, Ser: 1.68 mg/dL — ABNORMAL HIGH (ref 0.61–1.24)
GFR calc Af Amer: 51 mL/min — ABNORMAL LOW (ref >60)
GFR calc non Af Amer: 44 mL/min — ABNORMAL LOW (ref >60)
Glucose, Bld: 176 mg/dL — ABNORMAL HIGH (ref 70–99)
Potassium: 4.8 mmol/L (ref 3.5–5.1)
Sodium: 137 mmol/L (ref 135–145)

## 2019-05-30 LAB — CBC
HCT: 29.1 % — ABNORMAL LOW (ref 39.0–52.0)
Hemoglobin: 8.8 g/dL — ABNORMAL LOW (ref 13.0–17.0)
MCH: 25.7 pg — ABNORMAL LOW (ref 26.0–34.0)
MCHC: 30.2 g/dL (ref 30.0–36.0)
MCV: 84.8 fL (ref 80.0–100.0)
Platelets: 323 10*3/uL (ref 150–400)
RBC: 3.43 MIL/uL — ABNORMAL LOW (ref 4.22–5.81)
RDW: 16.9 % — ABNORMAL HIGH (ref 11.5–15.5)
WBC: 12 10*3/uL — ABNORMAL HIGH (ref 4.0–10.5)
nRBC: 0 % (ref 0.0–0.2)

## 2019-05-30 LAB — SARS CORONAVIRUS 2 (TAT 6-24 HRS): SARS Coronavirus 2: NEGATIVE

## 2019-05-30 LAB — GLUCOSE, CAPILLARY
Glucose-Capillary: 163 mg/dL — ABNORMAL HIGH (ref 70–99)
Glucose-Capillary: 177 mg/dL — ABNORMAL HIGH (ref 70–99)
Glucose-Capillary: 156 mg/dL — ABNORMAL HIGH (ref 70–99)
Glucose-Capillary: 267 mg/dL — ABNORMAL HIGH (ref 70–99)

## 2019-05-30 LAB — MAGNESIUM: Magnesium: 1.6 mg/dL — ABNORMAL LOW (ref 1.7–2.4)

## 2019-05-30 MED ORDER — MAGNESIUM OXIDE 400 (241.3 MG) MG PO TABS
800.0000 mg | ORAL_TABLET | ORAL | Status: AC
  Administered 2019-05-30 (×3): 800 mg via ORAL
  Filled 2019-05-30 (×3): qty 2

## 2019-05-30 MED ORDER — TRAZODONE HCL 50 MG PO TABS: 50.0000 mg | ORAL_TABLET | Freq: Every evening | ORAL | Status: DC | PRN

## 2019-05-30 NOTE — Progress Notes (Signed)
PROGRESS NOTE    Gregory [name]  [name] DOB: 1961-05-12 DOA: 05/22/2019 PCP: [name], MD   Brief Narrative:  58 year old with history of right AKA with chronic stump infection, multifocal osteomyelitis, chronic pain syndrome, chronic diastolic congestive heart failure, CKD stage III, COPD, DM 2, HTN, HLD came to the hospital with complains of right leg pain with some drainage around that site.  Upon admission he was noted to be febrile with elevated WBC with concerns of infection of the right lower extremity stump.  X-ray was negative for any bony involvement.  Orthopedic, Dr. Lorin Mercy was consulted. Ortho plans for revision of right aka.  Hesitant about surgical intervention.  ID consulted for antibiotic management.  Plastic surgery consulted for second opinion at the request of the patient who agreed with the assessment as well.  Underwent revision of the right above-the-knee amputation with wound VAC placement.  Postoperatively patient removed his wound VAC.  Seen by orthopedic.   Assessment & Plan:   Principal Problem:   Amputation stump infection ([place]) Active Problems:   Type II diabetes mellitus with neurological manifestations ([place])   CKD (chronic kidney disease), stage III   Tobacco use   Sepsis ([place])   Sepsis secondary to infected right femoral stump/osteomyelitis/status post right AKA with chronic ulcer -Sepsis physiology is improved.  Status post revision of right AKA 12/11. -Appreciate input from plastic surgery -ID recommends-doxycycline and Augmentin.  We can likely okay as of the time of discharge.  If surgical Gram stain remains negative, will not send him on any more further antibiotics -Pain control, bowel regimen -PT OT following.  Patient already removed his wound VAC yesterday. -Orthopedic following. -Pain control, bowel regimen.  Morphine increased due to significant amount of pain -Sed rate/CRP-elevated  Chronic left lower extremity lymphedema with  skin changes -Compression stockings and supportive care  Acute kidney injury on CKD stage IIIb, resolved -Creatinine baseline at 1.7.    Diabetes mellitus type 2, poorly controlled secondary to hyperglycemia -Insulin sliding scale and Accu-Chek -Lantus 22 units with premeal insulin.  Diabetic coordinator following. -A1c-8.6  Essential hypertension -Norvasc 10 mg daily.  Metoprolol 50 mg daily.  Chronic diastolic congestive heart failure -Clinically appears to be euvolemic.  Does have chronic lower extremity edema.  Anemia of chronic disease -Baseline hemoglobin around 11.  No obvious evidence of bleeding. Cont Iron Supplements.   HLD= Cont Lipitor.   DVT prophylaxis: Lovenox Code Status: Full code Family Communication: None Disposition Plan: Maintain hospital stay for SNF placement.  Appears he will likely have a bed tomorrow.  Today we will do Covid testing for placement.  Consultants:   Orthopedic  Infectious disease  Plastic surgery for second opinion regarding surgery at the request of patient  Procedures:  Revision of the right AKA 12/11  Antimicrobials:   IV Vanc, Roc   Subjective: Postoperatively yesterday patient became agitated and ended up pulling off his wound VAC.  Seen by orthopedic thereafter.  At this time he continues to request for IV pain medications despite of appearing comfortable in between unless someone enters his room.  Review of Systems Otherwise negative except as per HPI, including: General = no fevers, chills, dizziness, malaise, fatigue HEENT/EYES = negative for pain, redness, loss of vision, double vision, blurred vision, loss of hearing, sore throat, hoarseness, dysphagia Cardiovascular= negative for chest pain, palpitation, murmurs, lower extremity swelling Respiratory/lungs= negative for shortness of breath, cough, hemoptysis, wheezing, mucus production Gastrointestinal= negative for nausea, vomiting,, abdominal pain, melena,  hematemesis Genitourinary= negative for Dysuria, Hematuria, Change in Urinary Frequency MSK = Negative for arthralgia, myalgias, Back Pain, Joint swelling  Neurology= Negative for headache, seizures, numbness, tingling  Psychiatry= Negative for anxiety, depression, suicidal and homocidal ideation Allergy/Immunology= Medication/Food allergy as listed  Skin= Negative for Rash, lesions, ulcers, itching  Objective: Vitals:   05/29/19 1648 05/29/19 2019 05/30/19 0901 05/30/19 0913  BP: 140/76 133/78  130/73  Pulse: (!) 56 (!) 58  61  Resp: 20 18  18   Temp: 97.9 F (36.6 C) 98.3 F (36.8 C)  98.6 F (37 C)  TempSrc: Oral   Oral  SpO2: 95% 92% 92% 98%  Weight:      Height:        Intake/Output Summary (Last 24 hours) at 05/30/2019 1054 Last data filed at 05/30/2019 [id number] Gross per 24 hour  Intake 946.27 ml  Output 650 ml  Net 296.27 ml   Filed Weights   05/27/19 0512 05/28/19 2046 05/29/19 0857  Weight: 110.4 kg 110.4 kg 110.4 kg    Examination: Constitutional: Not in acute distress Respiratory: Clear to auscultation bilaterally Cardiovascular: Normal sinus rhythm, no rubs Abdomen: Nontender nondistended good bowel sounds Musculoskeletal: Right-sided surgical site of his AKA noted. Skin: Left lower extremities chronic skin changes Neurologic: CN 2-12 grossly intact.  And nonfocal Psychiatric: Normal judgment and insight. Alert and oriented x 3. Normal mood.     ata Reviewed:   CBC: Recent Labs  Lab 05/26/19 0602 05/27/19 0227 05/28/19 0515 05/29/19 0408 05/30/19 0456  WBC 6.1 4.9 6.7 7.4 12.0*  HGB 10.1* 10.0* 9.5* 10.3* 8.8*  HCT 32.7* 32.9* 31.3* 34.5* 29.1*  MCV 83.8 84.6 84.4 85.6 84.8  PLT 211 216 246 260 [id number]   Basic Metabolic Panel: Recent Labs  Lab 05/26/19 0602 05/27/19 0227 05/28/19 0515 05/29/19 0408 05/30/19 0456  NA 136 137 137 138 137  K 3.8 3.9 4.1 4.3 4.8  CL 99 99 103 104 102  CO2 25 26 26 24 23   GLUCOSE 218* 265* 301* 227* 176*    BUN 36* 32* 28* 26* 31*  CREATININE 2.11* 1.78* 1.73* 1.38* 1.68*  CALCIUM 8.7* 8.8* 8.5* 8.6* 9.0  MG 1.7 1.8 1.6* 1.7 1.6*   GFR: Estimated Creatinine Clearance: 55.2 mL/min (A) (by C-G formula based on SCr of 1.68 mg/dL (H)). Liver Function Tests: No results for input(s): AST, ALT, ALKPHOS, BILITOT, PROT, ALBUMIN in the last 168 hours. No results for input(s): LIPASE, AMYLASE in the last 168 hours. No results for input(s): AMMONIA in the last 168 hours. Coagulation Profile: No results for input(s): INR, PROTIME in the last 168 hours. Cardiac Enzymes: No results for input(s): CKTOTAL, CKMB, CKMBINDEX, TROPONINI in the last 168 hours. BNP (last 3 results) No results for input(s): PROBNP in the last 8760 hours. HbA1C: No results for input(s): HGBA1C in the last 72 hours. CBG: Recent Labs  Lab 05/29/19 1043 05/29/19 1135 05/29/19 1644 05/29/19 2018 05/30/19 0919  GLUCAP 197* 219* 255* 301* 163*   Lipid Profile: No results for input(s): CHOL, HDL, LDLCALC, TRIG, CHOLHDL, LDLDIRECT in the last 72 hours. Thyroid Function Tests: No results for input(s): TSH, T4TOTAL, FREET4, T3FREE, THYROIDAB in the last 72 hours. Anemia Panel: No results for input(s): VITAMINB12, FOLATE, FERRITIN, TIBC, IRON, RETICCTPCT in the last 72 hours. Sepsis Labs: No results for input(s): PROCALCITON, LATICACIDVEN in the last 168 hours.  Recent Results (from the past 240 hour(s))  Culture, blood (Routine x 2)     Status: None  Collection Time: 05/22/19 10:00 PM   Specimen: BLOOD  Result Value Ref Range Status   Specimen Description BLOOD LEFT ARM  Final   Special Requests   Final    BOTTLES DRAWN AEROBIC AND ANAEROBIC Blood Culture adequate volume   Culture   Final    NO GROWTH 5 DAYS Performed at [facility], [address] [place], [place] [address]    Report Status 05/28/2019 FINAL  Final  Culture, blood (Routine x 2)     Status: None   Collection Time: 05/22/19 10:30 PM    Specimen: BLOOD  Result Value Ref Range Status   Specimen Description BLOOD RIGHT ARM  Final   Special Requests   Final    BOTTLES DRAWN AEROBIC ONLY Blood Culture adequate volume   Culture   Final    NO GROWTH 5 DAYS Performed at [facility], [place] [address], [place], [place] [address]    Report Status 05/28/2019 FINAL  Final  SARS CORONAVIRUS 2 (TAT 6-24 HRS) Nasopharyngeal Nasopharyngeal Swab     Status: None   Collection Time: 05/23/19 12:56 AM   Specimen: Nasopharyngeal Swab  Result Value Ref Range Status   SARS Coronavirus 2 NEGATIVE NEGATIVE Final    Comment: (NOTE) SARS-CoV-2 target nucleic acids are NOT DETECTED. The SARS-CoV-2 RNA is generally detectable in upper and lower respiratory specimens during the acute phase of infection. Negative results do not preclude SARS-CoV-2 infection, do not rule out co-infections with other pathogens, and should not be used as the sole basis for treatment or other patient management decisions. Negative results must be combined with clinical observations, patient history, and epidemiological information. The expected result is Negative. Fact Sheet for Patients: SugarRoll.be Fact Sheet for Healthcare Providers: https://www.woods-mathews.com/ This test is not yet approved or cleared by the [place] FDA and  has been authorized for detection and/or diagnosis of SARS-CoV-2 by FDA under an Emergency Use Authorization (EUA). This EUA will remain  in effect (meaning this test can be used) for the duration of the COVID-19 declaration under Section 56 4(b)(1) of the Act, 21 U.S.C. section 360bbb-3(b)(1), unless the authorization is terminated or revoked sooner. Performed at [facility], [place] [address], [place], [place] [address]   Urine culture     Status: Abnormal   Collection Time: 05/23/19 12:56 AM   Specimen: In/Out Cath Urine  Result Value Ref Range Status   Specimen  Description IN/OUT CATH URINE  Final   Special Requests   Final    NONE Performed at [facility], [place] [address] [place], [place] [address]    Culture MULTIPLE SPECIES PRESENT, SUGGEST RECOLLECTION (A)  Final   Report Status 05/24/2019 FINAL  Final  MRSA PCR Screening     Status: Abnormal   Collection Time: 05/26/19  8:50 AM   Specimen: Nasopharyngeal  Result Value Ref Range Status   MRSA by PCR POSITIVE (A) NEGATIVE Final    Comment:        The GeneXpert MRSA Assay (FDA approved for NASAL specimens only), is one component of a comprehensive MRSA colonization surveillance program. It is not intended to diagnose MRSA infection nor to guide or monitor treatment for MRSA infections. RESULT CALLED TO, READ BACK BY AND VERIFIED WITH: Tawni Levy RN 10:40 05/26/19 ([email or web address]) Performed at [facility], [place] [address], [place], [place] [address]   Surgical pcr screen     Status: Abnormal   Collection Time: 05/28/19  6:16  PM   Specimen: Nasal Mucosa; Nasal Swab  Result Value Ref Range Status   MRSA, PCR POSITIVE (A) NEGATIVE Final    Comment: RESULT CALLED TO, READ BACK BY AND VERIFIED WITH: K LIPPARD RN 05/28/19 1948 JDW    Staphylococcus aureus POSITIVE (A) NEGATIVE Final    Comment: (NOTE) The Xpert SA Assay (FDA approved for NASAL specimens in patients 66 years of age and older), is one component of a comprehensive surveillance program. It is not intended to diagnose infection nor to guide or monitor treatment. Performed at [facility], [place] [address], [place], [place] [address]   Aerobic/Anaerobic Culture (surgical/deep wound)     Status: None (Preliminary result)   Collection Time: 05/29/19 10:01 AM   Specimen: Wound  Result Value Ref Range Status   Specimen Description WOUND  Final   Special Requests RIGHT ABOVE KNEE AMPUTATION  Final   Gram Stain   Final    RARE WBC PRESENT, PREDOMINANTLY PMN NO ORGANISMS SEEN    Culture   Final    NO GROWTH <  24 HOURS Performed at [facility], [place] [address], [place], [place] [address]    Report Status PENDING  Incomplete         Radiology Studies: No results found.      Scheduled Meds: . amLODipine  10 mg Oral Daily  . atorvastatin  20 mg Oral QHS  . buPROPion  300 mg Oral QHS  . Chlorhexidine Gluconate Cloth  6 each Topical Q0600  . docusate sodium  100 mg Oral BID  . enoxaparin (LOVENOX) injection  40 mg Subcutaneous Q24H  . ferrous sulfate  325 mg Oral Q breakfast  . finasteride  5 mg Oral Daily  . influenza vac split quadrivalent PF  0.5 mL Intramuscular Tomorrow-1000  . insulin aspart  0-5 Units Subcutaneous QHS  . insulin aspart  0-9 Units Subcutaneous TID WC  . insulin aspart  5 Units Subcutaneous TID WC  . insulin glargine  28 Units Subcutaneous Daily  . metoprolol tartrate  50 mg Oral Daily  . mirabegron ER  50 mg Oral Daily  . mupirocin ointment  1 application Nasal BID  . nicotine  14 mg Transdermal Daily  . oxybutynin  15 mg Oral QHS  . oxyCODONE  15 mg Oral Q12H  . oxymetazoline  2 spray Each Nare BID  . pantoprazole  40 mg Oral Daily  . PARoxetine  10 mg Oral QHS  . pregabalin  150 mg Oral BID  . senna-docusate  2 tablet Oral QHS  . tamsulosin  0.8 mg Oral QPM  . terbinafine  250 mg Oral Daily  . umeclidinium bromide  1 puff Inhalation Daily  . vitamin B-12  1,000 mcg Oral Daily   Continuous Infusions: . sodium chloride 250 mL (05/24/19 0551)  . cefTRIAXone (ROCEPHIN)  IV 2 g (05/29/19 2033)  . vancomycin 1,250 mg (05/28/19 0948)     LOS: 7 days   Time spent= 35 mins    Elvenia Godden Arsenio Loader, MD [facility]  If 7PM-7AM, please contact night-coverage  05/30/2019, 10:54 AM

## 2019-05-30 NOTE — Progress Notes (Signed)
PT Cancellation Note  Patient Details Name: Gregory [name] MRN: [id number] DOB: 14-May-1961   Cancelled Treatment:    Reason Eval/Treat Not Completed: Pain limiting ability to participate;Patient declined, no reason specified.  Pt is repeating his decline of visit, nursing in to help with his meds during the PT visit, and pt agreed to let a therapist try again tomorrow.   Ramond Dial 05/30/2019, 1:56 PM   Mee Hives, PT MS Acute Rehab Dept. Number: [place] [facility] [place]

## 2019-05-30 NOTE — Progress Notes (Signed)
PT Cancellation Note  Patient Details Name: Gregory [name] MRN: [id number] DOB: 30-Jun-1960   Cancelled Treatment:    Reason Eval/Treat Not Completed: Fatigue/lethargy limiting ability to participate.  Pt declined therapy, reported poor sleep and will try to see again at another time for re-assessment.     Ramond Dial 05/30/2019, 10:51 AM   Mee Hives, PT [facility] Acute Rehab Dept. Number: [place]

## 2019-05-30 NOTE — NC FL2 (Signed)
[place] LEVEL OF CARE SCREENING TOOL     IDENTIFICATION  Patient Name: Gregory [name] Birthdate: 1961/05/04 Sex: male Admission Date (Current Location): 05/22/2019  [place] and [place] Number:  [name] [id number] Miles and Address:  [facility] [place] [place] [address], [place], [place] [address]      Provider Number: [id number]  Attending Physician Name and Address:  Damita Lack, MD  Relative Name and Phone Number:  [name], Daughter, [phone]    Current Level of Care: Hospital Recommended Level of Care: [place] Prior Approval Number:    Date Approved/Denied:   PASRR Number: [id number]  Discharge Plan: SNF    Current Diagnoses: Patient Active Problem List   Diagnosis Date Noted  . CKD (chronic kidney disease), stage III 05/23/2019  . Tobacco use 05/23/2019  . Sepsis ([place]) 05/23/2019  . Acquired absence of right lower extremity above knee ([place]) 12/05/2018  . Amputation stump infection ([place])   . Lymphedema of left leg   . Diabetic polyneuropathy associated with type 2 diabetes mellitus ([place])   . Below-knee amputation of right lower extremity ([place]) 10/22/2018  . Dehiscence of amputation stump ([place])   . 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[place]) 08/12/2018  . Candidiasis 08/12/2018  . Necrosis of toe ([place]) 07/08/2018  . Amputated toe of left foot ([place]) 06/18/2018  . Streptococcal infection   . Pressure injury of skin 06/06/2018  . Cutaneous abscess of left foot   . Moderate protein-calorie malnutrition ([place])   . Cellulitis of left leg   . Septic arthritis ([place]) 06/03/2018  . Idiopathic chronic venous hypertension of left lower extremity with ulcer and inflammation ([place]) 11/14/2017  . Great toe amputation status, left 11/14/2017  . Acquired absence of right leg below knee ([place]) 06/20/2017  . Osteomyelitis ([place]) 03/25/2017  . Skin cancer 03/25/2017  . Depression, recurrent ([place]) 11/05/2016  .  Diabetic ulcer of toe of left foot ([place]) 10/02/2016  . 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 Chronic pain 09/27/2016  . Dyslipidemia associated with type 2 diabetes mellitus ([place]) 06/06/2016  . GERD without esophagitis 05/11/2016  . Hypertensive heart disease with congestive heart failure ([place]) 12/15/2015  . Spinal stenosis in cervical region 12/15/2015  . Type II diabetes mellitus with neurological manifestations ([place]) 12/15/2015  . Chronic diastolic CHF (congestive heart failure) ([place])   . Pulmonary hypertension ([place])   . Urinary retention   . Diskitis   . Foot drop, bilateral   . Sepsis with acute renal failure without septic shock ([place])   . MRSA bacteremia 10/03/2015  . COPD (chronic obstructive pulmonary disease) ([place]) 10/03/2015  . Acute osteomyelitis of left foot ([place]) 10/03/2015    Orientation RESPIRATION BLADDER Height & Weight     Self, Time, Situation, Place  Normal Continent Weight: 243 lb 6.2 oz (110.4 kg) Height:  5' 5.98" (167.6 cm)  BEHAVIORAL SYMPTOMS/MOOD NEUROLOGICAL BOWEL NUTRITION STATUS      Continent Diet(carb modified diet, thin liquids)  AMBULATORY STATUS COMMUNICATION OF NEEDS Skin   Limited Assist Verbally Surgical wounds(closed surgical incision on right leg and non-pressure wound on right leg (dressing in place))                       Personal Care Assistance Level of Assistance  Bathing, Feeding, Dressing Bathing Assistance: Limited assistance Feeding assistance: Independent Dressing Assistance: Limited assistance     Functional Limitations Info  Sight, Hearing, Speech Sight Info: Adequate Hearing Info: Adequate Speech  Info: Adequate    SPECIAL CARE FACTORS FREQUENCY  PT (By licensed PT), OT (By licensed OT)     PT Frequency: 5x/wk OT Frequency: 5x/wk            Contractures Contractures Info: Not present    Additional Factors Info  Code Status, Allergies, Psychotropic, Insulin Sliding Scale Code Status Info: Full Code Allergies  Info: Latex Psychotropic Info: paxil tablet 10mg  daily at bedtime and wellbutrin 24hr tablet 300mg  daily Insulin Sliding Scale Info: insulin aspart novolog 0-5 units at bedtime; insulin aspart novolog 0-9 unuts daily w/meals; insulin aspart novolog 5 units 3x daily w/meals; insulin glargine lantus 28 units daily       Current Medications (05/30/2019):  This is the current hospital active medication list Current Facility-Administered Medications  Medication Dose Route Frequency Provider Last Rate Last Admin  . 0.9 %  sodium chloride infusion   Intravenous PRN Persons, Bevely Palmer, [place] 10 mL/hr at 05/24/19 0551 250 mL at 05/24/19 0551  . acetaminophen (TYLENOL) tablet 650 mg  650 mg Oral Q6H PRN Persons, Bevely Palmer, PA   650 mg at 05/24/19 1600  . albuterol (PROVENTIL) (2.5 MG/3ML) 0.083% nebulizer solution 3 mL  3 mL Inhalation Q6H PRN Persons, Bevely Palmer, PA      . ALPRAZolam Duanne Moron) tablet 0.5 mg  0.5 mg Oral TID PRN Persons, Bevely Palmer, PA   0.5 mg at 05/30/19 0516  . amLODipine (NORVASC) tablet 10 mg  10 mg Oral Daily Persons, Bevely Palmer, [place]   10 mg at 05/30/19 [id number]  . atorvastatin (LIPITOR) tablet 20 mg  20 mg Oral QHS Persons, Bevely Palmer, [place]   20 mg at 05/29/19 2034  . buPROPion (WELLBUTRIN XL) 24 hr tablet 300 mg  300 mg Oral QHS Persons, Bevely Palmer, [place]   300 mg at 05/29/19 2037  . calcium carbonate (TUMS - dosed in mg elemental calcium) chewable tablet 200 mg of elemental calcium  1 tablet Oral TID PRN Persons, Bevely Palmer, PA   200 mg of elemental calcium at 05/25/19 1212  . cefTRIAXone (ROCEPHIN) 2 g in sodium chloride 0.9 % 100 mL IVPB  2 g Intravenous Q24H Persons, Bevely Palmer, PA 200 mL/hr at 05/29/19 2033 2 g at 05/29/19 2033  . Chlorhexidine Gluconate Cloth 2 % PADS 6 each  6 each Topical Q0600 Persons, Bevely Palmer, [place]   6 each at 05/30/19 0516  . docusate sodium (COLACE) capsule 100 mg  100 mg Oral BID Persons, Bevely Palmer, PA   100 mg at 05/30/19 [id number]  . enoxaparin (LOVENOX) injection 40 mg  40 mg  Subcutaneous Q24H Persons, Bevely Palmer, PA   40 mg at 05/30/19 [id number]  . ferrous sulfate tablet 325 mg  325 mg Oral Q breakfast Persons, Bevely Palmer, [place]   325 mg at 05/30/19 [id number]  . finasteride (PROSCAR) tablet 5 mg  5 mg Oral Daily Persons, Bevely Palmer, PA   5 mg at 05/30/19 [id number]  . hydrALAZINE (APRESOLINE) injection 10 mg  10 mg Intravenous Q4H PRN Persons, Bevely Palmer, PA      . influenza vac split quadrivalent PF (FLUARIX) injection 0.5 mL  0.5 mL Intramuscular Tomorrow-1000 Amin, Ankit Chirag, MD      . insulin aspart (novoLOG) injection 0-5 Units  0-5 Units Subcutaneous QHS Persons, Bevely Palmer, [place]   4 Units at 05/29/19 2030  . insulin aspart (novoLOG) injection 0-9 Units  0-9 Units Subcutaneous TID WC Persons, Bevely Palmer, [place]   2 Units at 05/30/19  [id number]  . insulin aspart (novoLOG) injection 5 Units  5 Units Subcutaneous TID WC Persons, Bevely Palmer, [place]   5 Units at 05/29/19 1725  . insulin glargine (LANTUS) injection 28 Units  28 Units Subcutaneous Daily Damita Lack, MD   28 Units at 05/30/19 [id number]  . metoCLOPramide (REGLAN) tablet 5-10 mg  5-10 mg Oral Q8H PRN Persons, Bevely Palmer, PA       Or  . metoCLOPramide (REGLAN) injection 5-10 mg  5-10 mg Intravenous Q8H PRN Persons, Bevely Palmer, PA      . metoprolol tartrate (LOPRESSOR) tablet 50 mg  50 mg Oral Daily Persons, Bevely Palmer, PA   50 mg at 05/30/19 [id number]  . mirabegron ER (MYRBETRIQ) tablet 50 mg  50 mg Oral Daily Persons, Bevely Palmer, PA   50 mg at 05/30/19 [id number]  . morphine 4 MG/ML injection 4 mg  4 mg Intravenous Q4H PRN Damita Lack, MD   4 mg at 05/30/19 0928  . mupirocin ointment (BACTROBAN) 2 % 1 application  1 application Nasal BID Persons, Bevely Palmer, [place]   1 application at 0000000 [id number]  . nicotine (NICODERM CQ - dosed in mg/24 hours) patch 14 mg  14 mg Transdermal Daily Persons, Bevely Palmer, PA   14 mg at 05/30/19 0941  . ondansetron (ZOFRAN) tablet 4 mg  4 mg Oral Q6H PRN Persons, Bevely Palmer, PA       Or  . ondansetron [facility]) injection 4 mg   4 mg Intravenous Q6H PRN Persons, Bevely Palmer, PA      . oxybutynin (DITROPAN XL) 24 hr tablet 15 mg  15 mg Oral QHS Persons, Bevely Palmer, [place]   15 mg at 05/29/19 2034  . oxyCODONE (Oxy IR/ROXICODONE) immediate release tablet 20 mg  20 mg Oral Q6H PRN Persons, Bevely Palmer, PA   20 mg at 05/30/19 0516  . oxyCODONE (OXYCONTIN) 12 hr tablet 15 mg  15 mg Oral Q12H Persons, Bevely Palmer, [place]   15 mg at 05/29/19 2037  . oxymetazoline (AFRIN) 0.05 % nasal spray 2 spray  2 spray Each Nare BID Persons, Bevely Palmer, [place]   2 spray at 05/27/19 2111  . pantoprazole (PROTONIX) EC tablet 40 mg  40 mg Oral Daily Persons, Bevely Palmer, [place]   40 mg at 05/30/19 [id number]  . PARoxetine (PAXIL) tablet 10 mg  10 mg Oral QHS Persons, Bevely Palmer, [place]   10 mg at 05/29/19 2033  . polyethylene glycol (MIRALAX / GLYCOLAX) packet 17 g  17 g Oral Daily PRN Persons, Bevely Palmer, PA      . pregabalin (LYRICA) capsule 150 mg  150 mg Oral BID Persons, Bevely Palmer, PA   150 mg at 05/30/19 [id number]  . senna-docusate (Senokot-S) tablet 2 tablet  2 tablet Oral QHS Persons, Bevely Palmer, [place]   2 tablet at 05/29/19 2035  . sodium chloride (OCEAN) 0.65 % nasal spray 1 spray  1 spray Each Nare PRN Persons, Bevely Palmer, [place]   1 spray at 05/25/19 0820  . tamsulosin (FLOMAX) capsule 0.8 mg  0.8 mg Oral QPM Persons, Bevely Palmer, PA   0.8 mg at 05/29/19 1711  . terbinafine (LAMISIL) tablet 250 mg  250 mg Oral Daily Persons, Bevely Palmer, [place]   250 mg at 05/30/19 [id number]  . tiZANidine (ZANAFLEX) tablet 4 mg  4 mg Oral Q8H PRN Persons, Bevely Palmer, [place]   4 mg at 05/29/19 2153  . umeclidinium bromide (INCRUSE ELLIPTA) 62.5 MCG/INH 1 puff  1  puff Inhalation Daily Persons, Bevely Palmer, [place]   1 puff at 05/30/19 0901  . vancomycin (VANCOCIN) 1,250 mg in sodium chloride 0.9 % 250 mL IVPB  1,250 mg Intravenous Q24H Persons, Bevely Palmer, PA 166.7 mL/hr at 05/28/19 0948 1,250 mg at 05/28/19 0948  . vitamin B-12 (CYANOCOBALAMIN) tablet 1,000 mcg  1,000 mcg Oral Daily Persons, Bevely Palmer, PA   1,000 mcg at 05/30/19  [id number]  . zolpidem (AMBIEN) tablet 5 mg  5 mg Oral QHS PRN,MR X 1 Persons, Bevely Palmer, PA   5 mg at 05/29/19 2310     Discharge Medications: Please see discharge summary for a list of discharge medications.  Relevant Imaging Results:  Relevant Lab Results:   Additional Information SSN: [id number]; COVID negative on 05/23/2019  Juliya Magill B Jermaine Tholl, LCSWA

## 2019-05-30 NOTE — Progress Notes (Signed)
Patient was able to slide from bed to reclining chair with chair being to closer to bed with minimum assist.  He was up in the chair for more than 4 hours.

## 2019-05-30 NOTE — Progress Notes (Signed)
Pt continuously asking for pain meds. Jeannette Corpus, NP notified.

## 2019-05-30 NOTE — TOC Initial Note (Signed)
Transition of Care [facility]) - Initial/Assessment Note    Patient Details  Name: Gregory [name] MRN: [id number] Date of Birth: Feb 10, 1961  [facility] CM/SW Contact:    Gelene Mink, [place] Phone Number: 05/30/2019, 2:32 PM  Clinical Narrative:                  Patient is from [facility] [employer] [facility]. Patient will return to his SNF once he is medically ready. Patient is awaiting COVID test result.   Plan for discharge on Sunday. CSW will continue to follow and assist with discharge planning.   Expected Discharge Plan: Skilled Nursing Facility Barriers to Discharge: Other (comment), Continued Medical Work up(Awaiting COVID screen)   Patient Goals and CMS Choice Patient states their goals for this hospitalization and ongoing recovery are:: Pt will return back to his SNF   Choice offered to / list presented to : NA  Expected Discharge Plan and Services Expected Discharge Plan: [place] In-house Referral: Clinical Social Work Discharge Planning Services: NA Post Acute Care Choice: [place] Living arrangements for the past 2 months: [place]                 DME Arranged: N/A DME Agency: NA       HH Arranged: NA Power Agency: NA        Prior Living Arrangements/Services Living arrangements for the past 2 months: [place] Lives with:: Facility Resident Patient language and need for interpreter reviewed:: No Do you feel safe going back to the place where you live?: Yes      Need for Family Participation in Patient Care: No (Comment) Care giver support system in place?: Yes (comment)   Criminal Activity/Legal Involvement Pertinent to Current Situation/Hospitalization: No - Comment as needed  Activities of Daily Living Home Assistive Devices/Equipment: Eyeglasses, Wheelchair ADL Screening (condition at time of admission) Patient's cognitive ability adequate to safely complete daily activities?: Yes Is  the patient deaf or have difficulty hearing?: No Does the patient have difficulty seeing, even when wearing glasses/contacts?: No Does the patient have difficulty concentrating, remembering, or making decisions?: No Patient able to express need for assistance with ADLs?: Yes Does the patient have difficulty dressing or bathing?: No Independently performs ADLs?: Yes (appropriate for developmental age) Does the patient have difficulty walking or climbing stairs?: Yes Weakness of Legs: Left(RIGHT AKA) Weakness of Arms/Hands: None  Permission Sought/Granted Permission sought to share information with : Case Manager       Permission granted to share info w AGENCY: [facility]        Emotional Assessment Appearance:: Appears stated age     Orientation: : Oriented to Self, Oriented to Place, Oriented to  Time, Oriented to Situation Alcohol / Substance Use: Not Applicable Psych Involvement: No (comment)  Admission diagnosis:  Wound infection [T14.8XXA, L08.9] Patient Active Problem List   Diagnosis Date Noted  . CKD (chronic kidney disease), stage III 05/23/2019  . Tobacco use 05/23/2019  . Sepsis ([place]) 05/23/2019  . Acquired absence of right lower extremity above knee ([place]) 12/05/2018  . Amputation stump infection ([place])   . Lymphedema of left leg   . Diabetic polyneuropathy associated with type 2 diabetes mellitus ([place])   . Below-knee amputation of right lower extremity ([place]) 10/22/2018  . Dehiscence of amputation stump ([place])   . 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[place]) 08/12/2018  . Candidiasis 08/12/2018  . Necrosis of toe ([place]) 07/08/2018  . Amputated toe of left  foot ([place]) 06/18/2018  . Streptococcal infection   . Pressure injury of skin 06/06/2018  . Cutaneous abscess of left foot   . Moderate protein-calorie malnutrition ([place])   . Cellulitis of left leg   . Septic arthritis ([place]) 06/03/2018  . Idiopathic chronic venous hypertension of left lower extremity with ulcer  and inflammation ([place]) 11/14/2017  . Great toe amputation status, left 11/14/2017  . Acquired absence of right leg below knee ([place]) 06/20/2017  . Osteomyelitis ([place]) 03/25/2017  . Skin cancer 03/25/2017  . Depression, recurrent ([place]) 11/05/2016  . Diabetic ulcer of toe of left foot ([place]) 10/02/2016  . 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 Chronic pain 09/27/2016  . Dyslipidemia associated with type 2 diabetes mellitus ([place]) 06/06/2016  . GERD without esophagitis 05/11/2016  . Hypertensive heart disease with congestive heart failure ([place]) 12/15/2015  . Spinal stenosis in cervical region 12/15/2015  . Type II diabetes mellitus with neurological manifestations ([place]) 12/15/2015  . Chronic diastolic CHF (congestive heart failure) ([place])   . Pulmonary hypertension ([place])   . Urinary retention   . Diskitis   . Foot drop, bilateral   . Sepsis with acute renal failure without septic shock ([place])   . MRSA bacteremia 10/03/2015  . COPD (chronic obstructive pulmonary disease) ([place]) 10/03/2015  . Acute osteomyelitis of left foot ([place]) 10/03/2015   PCP:  [name], MD Pharmacy:   [place], [place] [address] [place] [address] Phone: [phone] Fax: [phone]     Social Determinants of Health ([place]) Interventions    Readmission Risk Interventions Readmission Risk Prevention Plan 12/07/2018 11/20/2018  Transportation Screening Complete Complete  Medication Review ([place]) Complete Complete  PCP or Specialist appointment within 3-5 days of discharge Complete Complete  HRI or [place] Not Complete Not Complete  HRI or Home Care Consult Pt Refusal Comments pt from SNF LTC pt is LTC SNF resident  SW Recovery Care/Counseling Consult Complete Complete  Palliative Care Screening Not Applicable Not Applicable  Skilled Nursing Facility Complete Complete  Some recent data might be hidden

## 2019-05-30 NOTE — Progress Notes (Signed)
Patient ID: Gregory [name], male   DOB: 04-11-1961, 58 y.o.   MRN: [id number] Is a 58 year old gentleman postoperative day 1 revision right above-the-knee amputation.  Patient is resting comfortably this morning.  He denies any pain.  Discussed the importance of not manipulating the wound change the dry dressing as needed patient may discharge to skilled nursing at this time.  Tissue sent for cultures initial Gram stain's are negative for organisms

## 2019-05-31 LAB — GLUCOSE, CAPILLARY
Glucose-Capillary: 262 mg/dL — ABNORMAL HIGH (ref 70–99)
Glucose-Capillary: 202 mg/dL — ABNORMAL HIGH (ref 70–99)

## 2019-05-31 MED ORDER — OXYCODONE HCL 20 MG PO TABS: 20.0000 mg | ORAL_TABLET | Freq: Four times a day (QID) | ORAL | 0 refills | Status: DC | PRN

## 2019-05-31 MED ORDER — DOCUSATE SODIUM 100 MG PO CAPS: 100.0000 mg | ORAL_CAPSULE | Freq: Two times a day (BID) | ORAL | 0 refills | Status: DC

## 2019-05-31 MED ORDER — AMOXICILLIN-POT CLAVULANATE 875-125 MG PO TABS: 1.0000 | ORAL_TABLET | Freq: Two times a day (BID) | ORAL | 0 refills | Status: AC

## 2019-05-31 MED ORDER — DOXYCYCLINE MONOHYDRATE 100 MG PO TABS: 100.0000 mg | ORAL_TABLET | Freq: Two times a day (BID) | ORAL | 0 refills | Status: AC

## 2019-05-31 NOTE — Discharge Summary (Signed)
Physician Discharge Summary  Gregory [name] [name] DOB: 02/22/1961 DOA: 05/22/2019  PCP: [name], MD  Admit date: 05/22/2019 Discharge date: 05/31/2019  Admitted From: SNF Disposition: SNF  Recommendations for Outpatient Follow-up:  1. Follow up with PCP in 1-2 weeks 2. Please obtain BMP/CBC in one week your next doctors visit.  3. Oral Augmentin and doxycycline for 1 week 4. Follow-up outpatient orthopedic [name] in 1 week.  Discharge Condition: Stable CODE STATUS: Full code Diet recommendation: Heart healthy  Brief/Interim Summary: 58 year old with history of right AKA with chronic stump infection, multifocal osteomyelitis, chronic pain syndrome, chronic diastolic congestive heart failure, CKD stage III, COPD, DM 2, HTN, HLD came to the hospital with complains of right leg pain with some drainage around that site.  Upon admission he was noted to be febrile with elevated WBC with concerns of infection of the right lower extremity stump.  X-ray was negative for any bony involvement.  Orthopedic, Dr. Lorin Mercy was consulted. Ortho plans for revision of right aka.  Hesitant about surgical intervention.  ID consulted for antibiotic management.  Plastic surgery consulted for second opinion at the request of the patient who agreed with the assessment as well.  Underwent revision of the right above-the-knee amputation with wound VAC placement.  Postoperatively patient removed his wound VAC.  Seen by orthopedic and recommended outpatient follow-up.  Arrangements for skilled nursing facility made.   Discharge Diagnoses:  Principal Problem:   Amputation stump infection ([place]) Active Problems:   Type II diabetes mellitus with neurological manifestations ([place])   CKD (chronic kidney disease), stage III   Tobacco use   Sepsis ([place])  Sepsis secondary to infected right femoral stump/osteomyelitis/status post right AKA with chronic ulcer -Sepsis physiology has resolved.  Status post  revision of right AKA 12/11. -Appreciate input from plastic surgery -ID recommends-doxycycline and Augmentin.    Although Gram stain is negative, will give him 1 week of oral doxycycline Augmentin -Pain control, bowel regimen -PT OT following.  Patient already removed his wound VAC yesterday. -Orthopedic following.  Follow-up outpatient -Oxycodone IR 20 mg every 6 hours as needed for pain along with bowel regimen. 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    Diabetes mellitus type 2, poorly controlled secondary to hyperglycemia -Home regimen.  Would recommend outpatient follow-up with endocrinology -A1c-8.6  Essential hypertension -Norvasc 10 mg daily.  Metoprolol 50 mg daily.  Chronic diastolic congestive heart failure -Clinically appears to be euvolemic.  Does have chronic lower extremity edema.  Anemia of chronic disease -Baseline hemoglobin around 11.  No obvious evidence of bleeding. Cont Iron Supplements.   HLD= Cont Lipitor.   Consultations:  Orthopedic  Subjective: As I walked in the room patient was sleeping in the minute I woke him up he started complaining of right lower extremity pain and demanding he does not want to leave the hospital today and wants more pain medication. I have explained to him he does not seem to be any distress and his pain is well controlled.  He has reached max and benefit from hospital stay and will be discharged to skilled nursing facility.  Discharge Exam: Vitals:   05/31/19 0850 05/31/19 0900  BP:  138/80  Pulse:  64  Resp:  16  Temp:  98 F (36.7 C)  SpO2: 95% 96%   Vitals:   05/30/19 2114 05/31/19 0507  05/31/19 0850 05/31/19 0900  BP: (!) 124/58 (!) 148/82  138/80  Pulse: (!) 52 61  64  Resp: 16 15  16   Temp: 97.7 F (36.5 C) (!) 97.5 F (36.4 C)  98 F  (36.7 C)  TempSrc: Oral Oral  Oral  SpO2: 92% 94% 95% 96%  Weight:  109.3 kg    Height:        General: Pt is alert, awake, not in acute distress Cardiovascular: RRR, S1/S2 +, no rubs, no gallops Respiratory: CTA bilaterally, no wheezing, no rhonchi Abdominal: Soft, NT, ND, bowel sounds + Extremities: Right lower extremity AKA amputation status post revision with dressing in place.  Discharge Instructions  Discharge Instructions    Discharge wound care:   Complete by: As directed    Change dry dressing as needed   Neg Press Wound Therapy / Incisional   Complete by: As directed    Show patient how to attach prevena pump. Do not remove dressing     Allergies as of 05/31/2019      Reactions   Latex Other (See Comments)   Listed on MAR- pt sts he is not allergic.      Medication List    TAKE these medications   acetaminophen 325 MG tablet Commonly known as: TYLENOL Take 650 mg by mouth every 4 (four) hours as needed (general discomfort).   albuterol 108 (90 Base) MCG/ACT inhaler Commonly known as: VENTOLIN HFA Inhale 2 puffs into the lungs every 6 (six) hours as needed for wheezing or shortness of breath.   amLODipine 10 MG tablet Commonly known as: NORVASC Take 10 mg by mouth daily. for HTN   amoxicillin-clavulanate 875-125 MG tablet Commonly known as: Augmentin Take 1 tablet by mouth every 12 (twelve) hours for 7 days.   atorvastatin 20 MG tablet Commonly known as: LIPITOR Take 20 mg by mouth at bedtime.   Biofreeze 4 % Gel Generic drug: Menthol (Topical Analgesic) Apply 1 application topically every 4 (four) hours as needed (pain). Apply to right stump   bisacodyl 10 MG suppository Commonly known as: DULCOLAX Place 1 suppository (10 mg total) rectally daily as needed for moderate constipation.   buPROPion 300 MG 24 hr tablet Commonly known as: WELLBUTRIN XL Take 300 mg by mouth at bedtime.   calcium carbonate 500 MG chewable tablet Commonly known as:  TUMS - dosed in mg elemental calcium Chew 2 tablets by mouth every 8 (eight) hours as needed for indigestion or heartburn.   cetirizine 10 MG tablet Commonly known as: ZYRTEC Take 10 mg by mouth daily.   CETYLPYRIDINIUM CHLORIDE MT Use as directed 15 mLs in the mouth or throat every 4 (four) hours as needed (dry mouth).   collagenase ointment Commonly known as: SANTYL Apply 1 application topically daily. Apply to the affected area daily plus dry dressing   docusate sodium 100 MG capsule Commonly known as: COLACE Take 1 capsule (100 mg total) by mouth 2 (two) times daily.   doxycycline 100 MG tablet Commonly known as: ADOXA Take 1 tablet (100 mg total) by mouth 2 (two) times daily for 7 days.   eszopiclone 1 MG Tabs tablet Commonly known as: LUNESTA Take 1 mg by mouth at bedtime. Take immediately before bedtime   ferrous sulfate 325 (65 FE) MG tablet Take 1 tablet (325 mg total) by mouth daily with breakfast.   finasteride 5 MG tablet Commonly known as: PROSCAR Take 5 mg by mouth daily.   furosemide 40 MG tablet Commonly  known as: LASIX Take 40 mg by mouth daily.   glipiZIDE 10 MG tablet Commonly known as: GLUCOTROL Take 10 mg by mouth daily before breakfast.   Glucagon Emergency 1 MG injection Generic drug: glucagon Inject 1 mg into the muscle once as needed (for CBG <70).   HumaLOG KwikPen 100 UNIT/ML KwikPen Generic drug: insulin lispro Inject 0-10 Units into the skin See admin instructions. Inject 0-10 units per sliding scale 3 times daily before meals; CBG <70 = notify MD, 0-150 = 0 units, 151-200 = 2 units, 201-250 = 4 units, 251-300 = 6 units, 301-350 = 8 units, 351-400 = 10 units, >400 = notify MD   lidocaine 5 % Commonly known as: LIDODERM Place 1 patch onto the skin daily. Apply to Right stump. Remove & Discard patch within 12 hours or as directed by MD   magnesium oxide 400 MG tablet Commonly known as: MAG-OX Take 400 mg by mouth 2 (two) times daily.    metoprolol tartrate 50 MG tablet Commonly known as: LOPRESSOR Take 50 mg by mouth daily.   mirabegron ER 50 MG Tb24 tablet Commonly known as: MYRBETRIQ Take 50 mg by mouth daily.   Nasonex 50 MCG/ACT nasal spray Generic drug: mometasone Place 2 sprays into the nose at bedtime.   nicotine 14 mg/24hr patch Commonly known as: NICODERM CQ - dosed in mg/24 hours Place 14 mg onto the skin daily.   omeprazole 40 MG capsule Commonly known as: PRILOSEC Take 40 mg by mouth daily before breakfast.   ondansetron 4 MG tablet Commonly known as: ZOFRAN Take 4 mg by mouth every 6 (six) hours as needed for nausea or vomiting.   oxybutynin 15 MG 24 hr tablet Commonly known as: DITROPAN XL Take 15 mg by mouth at bedtime.   Oxycodone HCl 20 MG Tabs Take 1 tablet (20 mg total) by mouth every 6 (six) hours as needed (pain). What changed:   when to take this  reasons to take this  additional instructions  Another medication with the same name was removed. Continue taking this medication, and follow the directions you see here.   PARoxetine 20 MG tablet Commonly known as: PAXIL Take 10 mg by mouth at bedtime.   polyethylene glycol 17 g packet Commonly known as: MIRALAX / GLYCOLAX Take 17 g by mouth daily as needed for mild constipation.   pregabalin 150 MG capsule Commonly known as: LYRICA Take 1 capsule (150 mg total) by mouth 2 (two) times daily.   senna-docusate 8.6-50 MG tablet Commonly known as: Senokot-S Take 2 tablets by mouth at bedtime.   Spiriva Respimat 1.25 MCG/ACT Aers Generic drug: Tiotropium Bromide Monohydrate Inhale 2 puffs into the lungs daily.   tamsulosin 0.4 MG Caps capsule Commonly known as: FLOMAX Take 0.8 mg by mouth every evening.   terbinafine 250 MG tablet Commonly known as: LAMISIL Take 250 mg by mouth daily.   tiZANidine 4 MG tablet Commonly known as: ZANAFLEX Take 4 mg by mouth every 8 (eight) hours as needed for muscle spasms.   vitamin  B-12 500 MCG tablet Commonly known as: CYANOCOBALAMIN Take 1,000 mcg by mouth daily.   Vitamin D-3 125 MCG (5000 UT) Tabs Take 5,000 Units by mouth daily.            Discharge Care Instructions  (From admission, onward)         Start     Ordered   05/31/19 0000  Discharge wound care:    Comments: Change dry dressing  as needed   05/31/19 [id number]         Follow-up Information    [name], MD In 1 week.   Specialty: Orthopedic Surgery Contact information: [place] [address] [phone]        [name], MD. Schedule an appointment as soon as possible for a visit in 1 week(s).   Specialty: Internal Medicine Contact information: [place] [address] [phone]          Allergies  Allergen Reactions  . Latex Other (See Comments)    Listed on MAR- pt sts he is not allergic.    You were cared for by a hospitalist during your hospital stay. If you have any questions about your discharge medications or the care you received while you were in the hospital after you are discharged, you can call the unit and asked to speak with the hospitalist on call if the hospitalist that took care of you is not available. Once you are discharged, your primary care physician will handle any further medical issues. Please note that no refills for any discharge medications will be authorized once you are discharged, as it is imperative that you return to your primary care physician (or establish a relationship with a primary care physician if you do not have one) for your aftercare needs so that they can reassess your need for medications and monitor your lab values.   Procedures/Studies: DG Chest 2 View  Result Date: 05/22/2019 CLINICAL DATA:  Fever EXAM: CHEST - 2 VIEW COMPARISON:  06/03/2018 FINDINGS: The heart size and mediastinal contours are within normal limits. Both lungs are clear. Surgical hardware in the lower cervical spine.  IMPRESSION: No active cardiopulmonary disease. Electronically Signed   By: Donavan Foil M.D.   On: 05/22/2019 17:32   DG Femur Min 2 Views Right  Result Date: 05/23/2019 CLINICAL DATA:  Flank pain at the site of right amputation EXAM: RIGHT FEMUR 2 VIEWS COMPARISON:  None. FINDINGS: No fracture seen. There is heterotopic ossification seen at the amputation site. No cortical destruction is noted. There is mild periosteal thickening of the femoral shaft. There is diffuse osteopenia. 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 IMPRESSION: No acute osseous abnormality. Electronically Signed   By: Prudencio Pair M.D.   On: 05/23/2019 02:23     The results of significant diagnostics from this hospitalization (including imaging, microbiology, ancillary and laboratory) are listed below for reference.     Microbiology: Recent Results (from the past 240 hour(s))  Culture, blood (Routine x 2)     Status: None   Collection Time: 05/22/19 10:00 PM   Specimen: BLOOD  Result Value Ref Range Status   Specimen Description BLOOD LEFT ARM  Final   Special Requests   Final    BOTTLES DRAWN AEROBIC AND ANAEROBIC Blood Culture adequate volume   Culture   Final    NO GROWTH 5 DAYS Performed at [facility], [address], [place], [place] [address]    Report Status 05/28/2019 FINAL  Final  Culture, blood (Routine x 2)     Status: None   Collection Time: 05/22/19 10:30 PM   Specimen: BLOOD  Result Value Ref Range Status   Specimen Description BLOOD RIGHT ARM  Final   Special Requests   Final    BOTTLES DRAWN AEROBIC ONLY Blood Culture adequate volume   Culture   Final    NO GROWTH 5 DAYS Performed at [facility],  [address] [place], [place] [address]    Report Status 05/28/2019 FINAL  Final  SARS CORONAVIRUS 2 (TAT 6-24 HRS) Nasopharyngeal Nasopharyngeal Swab     Status: None   Collection Time: 05/23/19 12:56 AM   Specimen: Nasopharyngeal Swab  Result Value Ref Range Status   SARS  Coronavirus 2 NEGATIVE NEGATIVE Final    Comment: (NOTE) SARS-CoV-2 target nucleic acids are NOT DETECTED. The SARS-CoV-2 RNA is generally detectable in upper and lower respiratory specimens during the acute phase of infection. Negative results do not preclude SARS-CoV-2 infection, do not rule out co-infections with other pathogens, and should not be used as the sole basis for treatment or other patient management decisions. Negative results must be combined with clinical observations, patient history, and epidemiological information. The expected result is Negative. Fact Sheet for Patients: SugarRoll.be Fact Sheet for Healthcare Providers: https://www.woods-mathews.com/ This test is not yet approved or cleared by the [place] FDA and  has been authorized for detection and/or diagnosis of SARS-CoV-2 by FDA under an Emergency Use Authorization (EUA). This EUA will remain  in effect (meaning this test can be used) for the duration of the COVID-19 declaration under Section 56 4(b)(1) of the Act, 21 U.S.C. section 360bbb-3(b)(1), unless the authorization is terminated or revoked sooner. Performed at [facility], [place] [address], [place], [place] [address]   Urine culture     Status: Abnormal   Collection Time: 05/23/19 12:56 AM   Specimen: In/Out Cath Urine  Result Value Ref Range Status   Specimen Description IN/OUT CATH URINE  Final   Special Requests   Final    NONE Performed at [facility], [place] [address], [place], [place] [address]    Culture MULTIPLE SPECIES PRESENT, SUGGEST RECOLLECTION (A)  Final   Report Status 05/24/2019 FINAL  Final  MRSA PCR Screening     Status: Abnormal   Collection Time: 05/26/19  8:50 AM   Specimen: Nasopharyngeal  Result Value Ref Range Status   MRSA by PCR POSITIVE (A) NEGATIVE Final    Comment:        The GeneXpert MRSA Assay (FDA approved for NASAL specimens only), is one component  of a comprehensive MRSA colonization surveillance program. It is not intended to diagnose MRSA infection nor to guide or monitor treatment for MRSA infections. RESULT CALLED TO, READ BACK BY AND VERIFIED WITH: Tawni Levy RN 10:40 05/26/19 ([email or web address]) Performed at [facility], [place] [address] [place], [place] [address]   Surgical pcr screen     Status: Abnormal   Collection Time: 05/28/19  6:16 PM   Specimen: Nasal Mucosa; Nasal Swab  Result Value Ref Range Status   MRSA, PCR POSITIVE (A) NEGATIVE Final    Comment: RESULT CALLED TO, READ BACK BY AND VERIFIED WITH: K LIPPARD RN 05/28/19 1948 JDW    Staphylococcus aureus POSITIVE (A) NEGATIVE Final    Comment: (NOTE) The Xpert SA Assay (FDA approved for NASAL specimens in patients 63 years of age and older), is one component of a comprehensive surveillance program. It is not intended to diagnose infection nor to guide or monitor treatment. Performed at [facility], [place] [address], [place], [place] [address]   Aerobic/Anaerobic Culture (surgical/deep wound)     Status: None (Preliminary result)   Collection Time: 05/29/19 10:01 AM   Specimen: Wound  Result Value Ref Range Status   Specimen Description WOUND  Final   Special Requests RIGHT ABOVE KNEE AMPUTATION  Final   Gram Stain   Final    RARE WBC PRESENT, PREDOMINANTLY PMN NO ORGANISMS SEEN    Culture   Final    NO GROWTH 2 DAYS Performed at [facility] [address], [place], [place] [address]    Report Status PENDING  Incomplete  SARS CORONAVIRUS 2 (TAT 6-24 HRS) Nasopharyngeal Nasopharyngeal Swab     Status: None   Collection Time: 05/30/19 11:32 AM   Specimen: Nasopharyngeal Swab  Result Value Ref Range Status   SARS Coronavirus 2 NEGATIVE NEGATIVE Final    Comment: (NOTE) SARS-CoV-2 target nucleic acids are NOT DETECTED. The SARS-CoV-2 RNA is generally detectable in upper and lower respiratory specimens during the acute phase of infection.  Negative results do not preclude SARS-CoV-2 infection, do not rule out co-infections with other pathogens, and should not be used as the sole basis for treatment or other patient management decisions. Negative results must be combined with clinical observations, patient history, and epidemiological information. The expected result is Negative. Fact Sheet for Patients: SugarRoll.be Fact Sheet for Healthcare Providers: https://www.woods-mathews.com/ This test is not yet approved or cleared by the [place] FDA and  has been authorized for detection and/or diagnosis of SARS-CoV-2 by FDA under an Emergency Use Authorization (EUA). This EUA will remain  in effect (meaning this test can be used) for the duration of the COVID-19 declaration under Section 56 4(b)(1) of the Act, 21 U.S.C. section 360bbb-3(b)(1), unless the authorization is terminated or revoked sooner. Performed at [facility], [place] [address], [place], [place] [address]      Labs: BNP (last 3 results) No results for input(s): BNP in the last 8760 hours. Basic Metabolic Panel: Recent Labs  Lab 05/26/19 0602 05/27/19 0227 05/28/19 0515 05/29/19 0408 05/30/19 0456  NA 136 137 137 138 137  K 3.8 3.9 4.1 4.3 4.8  CL 99 99 103 104 102  CO2 25 26 26 24 23   GLUCOSE 218* 265* 301* 227* 176*  BUN 36* 32* 28* 26* 31*  CREATININE 2.11* 1.78* 1.73* 1.38* 1.68*  CALCIUM 8.7* 8.8* 8.5* 8.6* 9.0  MG 1.7 1.8 1.6* 1.7 1.6*   Liver Function Tests: No results for input(s): AST, ALT, ALKPHOS, BILITOT, PROT, ALBUMIN in the last 168 hours. No results for input(s): LIPASE, AMYLASE in the last 168 hours. No results for input(s): AMMONIA in the last 168 hours. CBC: Recent Labs  Lab 05/26/19 0602 05/27/19 0227 05/28/19 0515 05/29/19 0408 05/30/19 0456  WBC 6.1 4.9 6.7 7.4 12.0*  HGB 10.1* 10.0* 9.5* 10.3* 8.8*  HCT 32.7* 32.9* 31.3* 34.5* 29.1*  MCV 83.8 84.6 84.4 85.6 84.8   PLT 211 216 246 260 323   Cardiac Enzymes: No results for input(s): CKTOTAL, CKMB, CKMBINDEX, TROPONINI in the last 168 hours. BNP: Invalid input(s): POCBNP CBG: Recent Labs  Lab 05/30/19 1128 05/30/19 1646 05/30/19 2115 05/31/19 0653 05/31/19 1138  GLUCAP 177* 156* 267* 262* 202*   D-Dimer No results for input(s): DDIMER in the last 72 hours. Hgb A1c No results for input(s): HGBA1C in the last 72 hours. Lipid Profile No results for input(s): CHOL, HDL, LDLCALC, TRIG, CHOLHDL, LDLDIRECT in the last 72 hours. Thyroid function studies No results for input(s): TSH, T4TOTAL, T3FREE, THYROIDAB in the last 72 hours.  Invalid input(s): FREET3 Anemia work up No results for input(s): VITAMINB12, FOLATE, FERRITIN, TIBC, IRON, RETICCTPCT in the last 72 hours. Urinalysis    Component Value Date/Time   COLORURINE YELLOW 05/22/2019 2154  APPEARANCEUR CLEAR 05/22/2019 2154   LABSPEC 1.018 05/22/2019 2154   PHURINE 5.0 05/22/2019 2154   GLUCOSEU NEGATIVE 05/22/2019 2154   HGBUR NEGATIVE 05/22/2019 2154   [place] NEGATIVE 05/22/2019 2154   [place] NEGATIVE 05/22/2019 2154   PROTEINUR 100 (A) 05/22/2019 2154   UROBILINOGEN 1.0 02/03/2014 0052   NITRITE NEGATIVE 05/22/2019 2154   LEUKOCYTESUR NEGATIVE 05/22/2019 2154   Sepsis Labs Invalid input(s): PROCALCITONIN,  WBC,  LACTICIDVEN Microbiology Recent Results (from the past 240 hour(s))  Culture, blood (Routine x 2)     Status: None   Collection Time: 05/22/19 10:00 PM   Specimen: BLOOD  Result Value Ref Range Status   Specimen Description BLOOD LEFT ARM  Final   Special Requests   Final    BOTTLES DRAWN AEROBIC AND ANAEROBIC Blood Culture adequate volume   Culture   Final    NO GROWTH 5 DAYS Performed at [facility], [place] [address], [place], [place] [address]    Report Status 05/28/2019 FINAL  Final  Culture, blood (Routine x 2)     Status: None   Collection Time: 05/22/19 10:30 PM   Specimen: BLOOD   Result Value Ref Range Status   Specimen Description BLOOD RIGHT ARM  Final   Special Requests   Final    BOTTLES DRAWN AEROBIC ONLY Blood Culture adequate volume   Culture   Final    NO GROWTH 5 DAYS Performed at [facility], [place] [address] [place], [place] [address]    Report Status 05/28/2019 FINAL  Final  SARS CORONAVIRUS 2 (TAT 6-24 HRS) Nasopharyngeal Nasopharyngeal Swab     Status: None   Collection Time: 05/23/19 12:56 AM   Specimen: Nasopharyngeal Swab  Result Value Ref Range Status   SARS Coronavirus 2 NEGATIVE NEGATIVE Final    Comment: (NOTE) SARS-CoV-2 target nucleic acids are NOT DETECTED. The SARS-CoV-2 RNA is generally detectable in upper and lower respiratory specimens during the acute phase of infection. Negative results do not preclude SARS-CoV-2 infection, do not rule out co-infections with other pathogens, and should not be used as the sole basis for treatment or other patient management decisions. Negative results must be combined with clinical observations, patient history, and epidemiological information. The expected result is Negative. Fact Sheet for Patients: SugarRoll.be Fact Sheet for Healthcare Providers: https://www.woods-mathews.com/ This test is not yet approved or cleared by the [place] FDA and  has been authorized for detection and/or diagnosis of SARS-CoV-2 by FDA under an Emergency Use Authorization (EUA). This EUA will remain  in effect (meaning this test can be used) for the duration of the COVID-19 declaration under Section 56 4(b)(1) of the Act, 21 U.S.C. section 360bbb-3(b)(1), unless the authorization is terminated or revoked sooner. Performed at [facility], [place] [address], [place], [place] [address]   Urine culture     Status: Abnormal   Collection Time: 05/23/19 12:56 AM   Specimen: In/Out Cath Urine  Result Value Ref Range Status   Specimen Description IN/OUT CATH  URINE  Final   Special Requests   Final    NONE Performed at [facility], [place] [address] [place], [place] [address]    Culture MULTIPLE SPECIES PRESENT, SUGGEST RECOLLECTION (A)  Final   Report Status 05/24/2019 FINAL  Final  MRSA PCR Screening     Status: Abnormal   Collection Time: 05/26/19  8:50 AM   Specimen: Nasopharyngeal  Result Value Ref Range Status   MRSA by PCR  POSITIVE (A) NEGATIVE Final    Comment:        The GeneXpert MRSA Assay (FDA approved for NASAL specimens only), is one component of a comprehensive MRSA colonization surveillance program. It is not intended to diagnose MRSA infection nor to guide or monitor treatment for MRSA infections. RESULT CALLED TO, READ BACK BY AND VERIFIED WITH: Tawni Levy RN 10:40 05/26/19 ([email or web address]) Performed at [facility], [place] [address], [place], [place] [address]   Surgical pcr screen     Status: Abnormal   Collection Time: 05/28/19  6:16 PM   Specimen: Nasal Mucosa; Nasal Swab  Result Value Ref Range Status   MRSA, PCR POSITIVE (A) NEGATIVE Final    Comment: RESULT CALLED TO, READ BACK BY AND VERIFIED WITH: K LIPPARD RN 05/28/19 1948 JDW    Staphylococcus aureus POSITIVE (A) NEGATIVE Final    Comment: (NOTE) The Xpert SA Assay (FDA approved for NASAL specimens in patients 40 years of age and older), is one component of a comprehensive surveillance program. It is not intended to diagnose infection nor to guide or monitor treatment. Performed at [facility], [place] [address], [place], [place] [address]   Aerobic/Anaerobic Culture (surgical/deep wound)     Status: None (Preliminary result)   Collection Time: 05/29/19 10:01 AM   Specimen: Wound  Result Value Ref Range Status   Specimen Description WOUND  Final   Special Requests RIGHT ABOVE KNEE AMPUTATION  Final   Gram Stain   Final    RARE WBC PRESENT, PREDOMINANTLY PMN NO ORGANISMS SEEN    Culture   Final    NO GROWTH 2 DAYS Performed at [facility], [place] [address] [place],  [address]    Report Status PENDING  Incomplete  SARS CORONAVIRUS 2 (TAT 6-24 HRS) Nasopharyngeal Nasopharyngeal Swab     Status: None   Collection Time: 05/30/19 11:32 AM   Specimen: Nasopharyngeal Swab  Result Value Ref Range Status   SARS Coronavirus 2 NEGATIVE NEGATIVE Final    Comment: (NOTE) SARS-CoV-2 target nucleic acids are NOT DETECTED. The SARS-CoV-2 RNA is generally detectable in upper and lower respiratory specimens during the acute phase of infection. Negative results do not preclude SARS-CoV-2 infection, do not rule out co-infections with other pathogens, and should not be used as the sole basis for treatment or other patient management decisions. Negative results must be combined with clinical observations, patient history, and epidemiological information. The expected result is Negative. Fact Sheet for Patients: SugarRoll.be Fact Sheet for Healthcare Providers: https://www.woods-mathews.com/ This test is not yet approved or cleared by the [place] FDA and  has been authorized for detection and/or diagnosis of SARS-CoV-2 by FDA under an Emergency Use Authorization (EUA). This EUA will remain  in effect (meaning this test can be used) for the duration of the COVID-19 declaration under Section 56 4(b)(1) of the Act, 21 U.S.C. section 360bbb-3(b)(1), unless the authorization is terminated or revoked sooner. Performed at [facility], [place] [address] [place],  [address]      Time coordinating discharge:  I have spent 35 minutes face to face with the patient and on the ward discussing the patients care, assessment, plan and disposition with other care givers. >50% of the time was devoted counseling the patient about the risks and benefits of treatment/Discharge disposition and coordinating care.   SIGNED:   Damita Lack, MD  [facility] 05/31/2019,  11:43 AM   If 7PM-7AM, please contact  night-coverage

## 2019-05-31 NOTE — Plan of Care (Signed)
  Problem: Health Behavior/Discharge Planning: Goal: Ability to manage health-related needs will improve Outcome: Progressing   Problem: Clinical Measurements: Goal: Diagnostic test results will improve Outcome: Progressing   

## 2019-05-31 NOTE — Progress Notes (Signed)
PT Cancellation Note  Patient Details Name: DEVONTEZ ASCHER MRN: [id number] DOB: 02-Oct-1960   Cancelled Treatment:    Reason Eval/Treat Not Completed: Patient declined emphatically "I'm nauseated and I'm not doing it! I'm refusing"  PT/OT attempted to gain pt's participation in light of pain medicine given one hour prior and pending discharge, however he continued to adamantly refuse.    Barry Brunner, PT Pager [phone]   Rexanne Mano 05/31/2019, 9:00 AM

## 2019-05-31 NOTE — Anesthesia Postprocedure Evaluation (Signed)
Anesthesia Post Note  Patient: Gregory [name]  Procedure(s) Performed: REVISION RIGHT ABOVE KNEE AMPUTATION (Right )     Anesthesia Post Evaluation  Last Vitals:  Vitals:   05/31/19 0850 05/31/19 0900  BP:  138/80  Pulse:  64  Resp:  16  Temp:  36.7 C  SpO2: 95% 96%    Last Pain:  Vitals:   05/31/19 1351  TempSrc:   PainSc: 7                  Metztli Sachdev

## 2019-05-31 NOTE — Progress Notes (Addendum)
DISCHARGE NOTE SNF [facility] to be discharged Skilled nursing facility per MD order. Patient verbalized understanding.  Skin clean, dry and intact without evidence of skin break down, no evidence of skin tears noted. IV catheter discontinued intact. Site without signs and symptoms of complications. Dressing and pressure applied. Pt denies pain at the site currently. No complaints noted.  Patient free of lines, drains, and wounds.   Discharge packet assembled. An After Visit Summary (AVS) was printed and given to the EMS personnel. Patient escorted via stretcher and discharged to [facility] via ambulance. Report called to facility with all questions were answered. Patient was medicated for pain prior to dressing changed.  Dolores Hoose, RN

## 2019-05-31 NOTE — Plan of Care (Signed)
  Problem: Health Behavior/Discharge Planning: Goal: Ability to manage health-related needs will improve 05/31/2019 1244 by Dolores Hoose, RN Outcome: Adequate for Discharge 05/31/2019 [id number] by Dolores Hoose, RN Outcome: Progressing   Problem: Clinical Measurements: Goal: Ability to maintain clinical measurements within normal limits will improve Outcome: Adequate for Discharge Goal: Will remain free from infection Outcome: Adequate for Discharge Goal: Diagnostic test results will improve 05/31/2019 1244 by Dolores Hoose, RN Outcome: Adequate for Discharge 05/31/2019 [id number] by Dolores Hoose, RN Outcome: Progressing Goal: Respiratory complications will improve Outcome: Adequate for Discharge Goal: Cardiovascular complication will be avoided Outcome: Adequate for Discharge   Problem: Activity: Goal: Risk for activity intolerance will decrease Outcome: Adequate for Discharge

## 2019-05-31 NOTE — Progress Notes (Signed)
OT Cancellation Note  Patient Details Name: JACKS MUTTI MRN: [id number] DOB: 1961-01-30   Cancelled Treatment:    Reason Eval/Treat Not Completed: Patient declined, no reason specified  Patient declined emphatically "I'm nauseated and I'm not doing it! I'm refusing". PT/OT attempted to gain pt's participation in light of pain medicine given one hour prior and pending discharge, however he continued to adamantly refuse.   Zenovia Jarred, MSOT, OTR/L Behavioral Health OT/ Acute Relief OT [facility] Office: [phone]  Zenovia Jarred 05/31/2019, 9:41 AM

## 2019-05-31 NOTE — TOC Transition Note (Signed)
Transition of Care [facility]) - CM/SW Discharge Note   Patient Details  Name: Gregory [name] MRN: [id number] Date of Birth: 03/28/1961  [facility] of [facility]) CM/SW Contact:  Gelene Mink, [place] Phone Number: 05/31/2019, 9:59 AM   Clinical Narrative:     Patient will DC to: [facility] Anticipated DC date: 05/31/2019 Family notified: Yes Transport by: Corey Harold   Per MD patient ready for DC to . RN, patient, patient's family, and facility notified of DC. Discharge Summary and FL2 sent to facility. RN to call report prior to discharge [phone], patient will go to room 107. DC packet on chart. Ambulance transport requested for patient.   CSW will sign off for now as social work intervention is no longer needed. Please consult [place] again if new needs arise.  Sabryna Lahm, LCSW-A [place]/Clinical Social Work Department Cell: [phone]    Final next level of care: [place] Barriers to Discharge: No Barriers Identified   Patient Goals and CMS Choice Patient states their goals for this hospitalization and ongoing recovery are:: Pt will return to SNF   Choice offered to / list presented to : NA  Discharge Placement   Existing PASRR number confirmed : 05/30/19          Patient chooses bed at: Other - please specify in the comment section below:([facility]) Patient to be transferred to facility by: [facility] Name of family member notified: [name] Patient and family notified of of transfer: 05/31/19  Discharge Plan and Services In-house Referral: Clinical Social Work Discharge Planning Services: NA Post Acute Care Choice: [place]          DME Arranged: N/A DME Agency: NA       HH Arranged: NA HH Agency: NA        Social Determinants of Health (SDOH) Interventions     Readmission Risk Interventions Readmission Risk Prevention Plan 12/07/2018 11/20/2018  Transportation Screening Complete Complete  Medication Review Press photographer)  Complete Complete  PCP or Specialist appointment within 3-5 days of discharge Complete Complete  HRI or [place] Not Complete Not Complete  HRI or Home Care Consult Pt Refusal Comments pt from SNF LTC pt is LTC SNF resident  SW Recovery Care/Counseling Consult Complete Complete  Palliative Care Screening Not Applicable Not Applicable  Skilled Nursing Facility Complete Complete  Some recent data might be hidden

## 2019-06-01 LAB — FUNGUS STAIN

## 2019-06-01 LAB — FUNGAL STAIN REFLEX

## 2019-06-02 ENCOUNTER — Ambulatory Visit: Payer: Medicaid Other | Admitting: Orthopedic Surgery

## 2019-06-03 LAB — AEROBIC/ANAEROBIC CULTURE W GRAM STAIN (SURGICAL/DEEP WOUND): Culture: NO GROWTH

## 2019-06-05 ENCOUNTER — Telehealth: Payer: Self-pay

## 2019-06-05 NOTE — Telephone Encounter (Signed)
Nurse from [facility] called stating that patient's RT stump was swollen, incision was busted open, and that 2 staples had came out.  Patient had RT above the knee amputation on Friday, 05/29/2019. Advised Autumn of message above due to [name] being in surgery this morning and she spoke with nurse at [facility] and advised her to pack the incision and apply a dry dressing and if she felt as though it was getting worse before his appointment on Monday 06/08/2019, then patient is to be seen at the ED.

## 2019-06-08 ENCOUNTER — Ambulatory Visit (INDEPENDENT_AMBULATORY_CARE_PROVIDER_SITE_OTHER): Payer: Medicaid Other | Admitting: Orthopedic Surgery

## 2019-06-08 ENCOUNTER — Other Ambulatory Visit: Payer: Self-pay

## 2019-06-08 ENCOUNTER — Encounter: Payer: Self-pay | Admitting: Orthopedic Surgery

## 2019-06-08 VITALS — Ht 65.98 in | Wt 241.0 lb

## 2019-06-08 DIAGNOSIS — Z89611 Acquired absence of right leg above knee: Secondary | ICD-10-CM

## 2019-06-08 DIAGNOSIS — S78111A Complete traumatic amputation at level between right hip and knee, initial encounter: Secondary | ICD-10-CM

## 2019-06-08 NOTE — Progress Notes (Signed)
Office Visit Note   Patient: Gregory [name]           Date of Birth: Oct 27, 1960           MRN: [id number] Visit Date: 06/08/2019              Requested by: [name], MD [place],  [place] [address] PCP: [name], MD  Chief Complaint  Patient presents with  . Right Leg - Routine Post Op    05/29/2019 Revision Right AKA      HPI: This is a pleasant gentleman who is 9 days status post revision of right above-knee amputation he is currently in a nursing facility.  He presents today without his wound VAC.  He is complaining that his leg is swollen and he is not getting adequate pain control  Assessment & Plan: Visit Diagnoses: No diagnosis found.  Plan: We recommend  Compression wraps daily . Pain control is difficult given his chronic narcotic use. Could have NSAIDS. Worry about him constantly touching wound and infection  Follow-Up Instructions: No follow-ups on file.   Ortho Exam  Patient is alert, oriented, no adenopathy, well-dressed, normal affect, normal respiratory effort. Right AKA  Healthy wound edges. 2cm area of dehiscence bloody drainage. Moderate soft tissue swelling. No cellulitis   Imaging: No results found. No images are attached to the encounter.  Labs: Lab Results  Component Value Date   HGBA1C 8.6 (H) 05/23/2019   HGBA1C 7.7 (H) 11/17/2018   HGBA1C 8.0 (H) 06/04/2018   ESRSEDRATE 102 (H) 05/27/2019   ESRSEDRATE 120 (H) 03/25/2017   ESRSEDRATE 84 (H) 10/02/2016   CRP 10.2 (H) 05/27/2019   CRP 4.9 (H) 03/25/2017   CRP 30.6 (H) 10/02/2016   REPTSTATUS 06/03/2019 FINAL 05/29/2019   GRAMSTAIN  05/29/2019    RARE WBC PRESENT, PREDOMINANTLY PMN NO ORGANISMS SEEN    CULT  05/29/2019    No growth aerobically or anaerobically. Performed at [facility], [place] [address], [place], [place] [address]    LABORGA PROVIDENCIA [name] (A) 01/12/2017     Lab Results  Component Value Date   ALBUMIN 3.3 (L) 05/22/2019   ALBUMIN 3.3 (L) 10/22/2018   ALBUMIN 2.2 (L) 06/04/2018   PREALBUMIN 23.3 03/25/2017    Lab Results  Component Value Date   MG 1.6 (L) 05/30/2019   MG 1.7 05/29/2019   MG 1.6 (L) 05/28/2019   No results found for: VD25OH  Lab Results  Component Value Date   PREALBUMIN 23.3 03/25/2017   CBC EXTENDED Latest Ref Rng & Units 05/30/2019 05/29/2019 05/28/2019  WBC 4.0 - 10.5 K/uL 12.0(H) 7.4 6.7  RBC 4.22 - 5.81 MIL/uL 3.43(L) 4.03(L) 3.71(L)  HGB 13.0 - 17.0 g/dL 8.8(L) 10.3(L) 9.5(L)  HCT 39.0 - 52.0 % 29.1(L) 34.5(L) 31.3(L)  PLT 150 - 400 K/uL 323 260 246  NEUTROABS 1.7 - 7.7 K/uL - - -  LYMPHSABS 0.7 - 4.0 K/uL - - -     Body mass index is 38.92 kg/m.  Orders:  No orders of the defined types were placed in this encounter.  No orders of the defined types were placed in this encounter.    Procedures: No procedures performed  Clinical Data: No additional findings.  ROS:  All other systems negative, except as noted in the HPI. Review of Systems  Objective: Vital Signs: Ht 5' 5.98" (1.676 m)   Wt 240 lb 15.4 oz (109.3 kg)   BMI 38.92 kg/m  Specialty Comments:  No specialty comments available.  PMFS History: Patient Active Problem List   Diagnosis Date Noted  . CKD (chronic kidney disease), stage III 05/23/2019  . Tobacco use 05/23/2019  . Sepsis ([place]) 05/23/2019  . Acquired absence of right lower extremity above knee ([place]) 12/05/2018  . Amputation stump infection ([place])   . Lymphedema of left leg   . Diabetic polyneuropathy associated with type 2 diabetes mellitus ([place])   . Below-knee amputation of right lower extremity ([place]) 10/22/2018  . Dehiscence of amputation stump ([place])   . 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[place]) 08/12/2018  . Candidiasis 08/12/2018  . Necrosis of toe ([place]) 07/08/2018  . Amputated toe of left foot ([place]) 06/18/2018  . Streptococcal infection   . Pressure injury of skin 06/06/2018  . Cutaneous abscess of left foot   .  Moderate protein-calorie malnutrition ([place])   . Cellulitis of left leg   . Septic arthritis ([place]) 06/03/2018  . Idiopathic chronic venous hypertension of left lower extremity with ulcer and inflammation ([place]) 11/14/2017  . Great toe amputation status, left 11/14/2017  . Acquired absence of right leg below knee ([place]) 06/20/2017  . Osteomyelitis ([place]) 03/25/2017  . Skin cancer 03/25/2017  . Depression, recurrent ([place]) 11/05/2016  . Diabetic ulcer of toe of left foot ([place]) 10/02/2016  . 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 Chronic pain 09/27/2016  . Dyslipidemia associated with type 2 diabetes mellitus ([place]) 06/06/2016  . GERD without esophagitis 05/11/2016  . Hypertensive heart disease with congestive heart failure ([place]) 12/15/2015  . Spinal stenosis in cervical region 12/15/2015  . Type II diabetes mellitus with neurological manifestations ([place]) 12/15/2015  . Chronic diastolic CHF (congestive heart failure) ([place])   . Pulmonary hypertension ([place])   . Urinary retention   . Diskitis   . Foot drop, bilateral   . Sepsis with acute renal failure without septic shock ([place])   . MRSA bacteremia 10/03/2015  . COPD (chronic obstructive pulmonary disease) ([place]) 10/03/2015  . Acute osteomyelitis of left foot ([place]) 10/03/2015   Past Medical History:  Diagnosis Date  . Acquired absence of left great toe ([place])   . Acquired absence of right leg below knee ([place])   . Acute osteomyelitis of calcaneum, right ([place]) 10/02/2016  . Acute respiratory failure ([place])   . Amputation stump infection ([place]) 08/12/2018  . Anemia   . Basal cell carcinoma, eyelid   . Cancer ([place])   . Candidiasis 08/12/2018  . CHF (congestive heart failure) (HCC)    chronic diastolic   . Chronic back pain   . Chronic kidney disease    stage 3   . Chronic multifocal osteomyelitis (HCC)    of ankle and foot   . Chronic pain syndrome   . COPD (chronic obstructive pulmonary disease) ([place])   . DDD (degenerative disc disease), lumbar   .  Depression    major depressive disorder   . Diabetes mellitus without complication ([place])    type 2   . Diabetic ulcer of toe of left foot ([place]) 10/02/2016  . Difficulty in walking, not elsewhere classified   . 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 Foot drop, bilateral   . Foot drop, right 12/27/2015  . GERD (gastroesophageal reflux disease)   . Herpesviral vesicular dermatitis   . Hyperlipidemia   . Hyperlipidemia   . Hypertension   . Insomnia   . Malignant melanoma of other parts of face ([place])   . MRSA bacteremia   . Muscle weakness (generalized)   .  Nasal congestion   . Necrosis of toe ([place]) 07/08/2018  . Neuromuscular dysfunction of bladder   . Osteomyelitis ([place])   . Osteomyelitis ([place])   . Osteomyelitis ([place])    right BKA  . Other idiopathic peripheral autonomic neuropathy   . Retention of urine, unspecified   . Squamous cell carcinoma of skin   . Unsteadiness on feet   . Urinary retention   . Urinary retention   . Urinary tract infection   . Wears dentures   . Wears glasses     Family History  Problem Relation Age of Onset  . Cancer Other   . Diabetes Other     Past Surgical History:  Procedure Laterality Date  . AMPUTATION Left 03/29/2017   Procedure: LEFT GREAT TOE AMPUTATION AT METATARSOPHALANGEAL JOINT;  Surgeon: [name], MD;  Location: [facility];  Service: Orthopedics;  Laterality: Left;  . AMPUTATION Right 03/29/2017   Procedure: RIGHT BELOW KNEE AMPUTATION;  Surgeon: [name], MD;  Location: [facility];  Service: Orthopedics;  Laterality: Right;  . AMPUTATION Left 06/06/2018   Procedure: LEFT 2ND TOE AMPUTATION;  Surgeon: [name], MD;  Location: [facility];  Service: Orthopedics;  Laterality: Left;  . AMPUTATION Right 11/19/2018   Procedure: AMPUTATION ABOVE KNEE;  Surgeon: [name], MD;  Location: [facility];  Service: Orthopedics;  Laterality: Right;  . ANTERIOR CERVICAL CORPECTOMY N/A 11/25/2015   Procedure: ANTERIOR CERVICAL FIVE CORPECTOMY Cervical four -  six fusion;  Surgeon: Consuella Lose, MD;  Location: [facility];  Service: Neurosurgery;  Laterality: N/A;  ANTERIOR CERVICAL FIVE CORPECTOMY Cervical four - six fusion  . APPLICATION OF WOUND VAC Right 10/22/2018   Procedure: Application Of Wound Vac;  Surgeon: [name], MD;  Location: [facility];  Service: Orthopedics;  Laterality: Right;  . CYSTOSCOPY WITH RETROGRADE URETHROGRAM N/A 04/25/2018   Procedure: CYSTOSCOPY WITH RETROGRADE URETHROGRAM/ BALLOON DILATION;  Surgeon: Kathie Rhodes, MD;  Location: [facility];  Service: Urology;  Laterality: N/A;  . MULTIPLE TOOTH EXTRACTIONS    . POSTERIOR CERVICAL FUSION/FORAMINOTOMY N/A 11/29/2015   Procedure: Cervical Three-Cervical Seven Posterior Cervical Laminectomy with Fusion;  Surgeon: Consuella Lose, MD;  Location: [facility];  Service: Neurosurgery;  Laterality: N/A;  Cervical Three-Cervical Seven Posterior Cervical Laminectomy with Fusion  . STUMP REVISION Right 10/22/2018   Procedure: REVISION RIGHT BELOW KNEE AMPUTATION;  Surgeon: [name], MD;  Location: [facility];  Service: Orthopedics;  Laterality: Right;  . STUMP REVISION Right 12/05/2018   Procedure: Revision Right Above Knee Amputation;  Surgeon: [name], MD;  Location: [facility];  Service: Orthopedics;  Laterality: Right;  . STUMP REVISION Right 05/29/2019   Procedure: REVISION RIGHT ABOVE KNEE AMPUTATION;  Surgeon: [name], MD;  Location: [facility];  Service: Orthopedics;  Laterality: Right;   Social History   Occupational History  . Not on file  Tobacco Use  . Smoking status: Current Every Day Smoker    Packs/day: 0.50    Types: Cigarettes  . Smokeless tobacco: Never Used  . Tobacco comment: 11/19/18  Substance and Sexual Activity  . Alcohol use: No  . Drug use: No  . Sexual activity: Not on file

## 2019-06-18 ENCOUNTER — Other Ambulatory Visit: Payer: Self-pay | Admitting: Orthopedic Surgery

## 2019-06-18 ENCOUNTER — Telehealth: Payer: Self-pay | Admitting: Physician Assistant

## 2019-06-18 ENCOUNTER — Other Ambulatory Visit: Payer: Self-pay

## 2019-06-18 ENCOUNTER — Encounter: Payer: Self-pay | Admitting: Physician Assistant

## 2019-06-18 ENCOUNTER — Ambulatory Visit (INDEPENDENT_AMBULATORY_CARE_PROVIDER_SITE_OTHER): Payer: Medicaid Other | Admitting: Physician Assistant

## 2019-06-18 VITALS — Ht 65.0 in | Wt 240.0 lb

## 2019-06-18 DIAGNOSIS — M86472 Chronic osteomyelitis with draining sinus, left ankle and foot: Secondary | ICD-10-CM

## 2019-06-18 MED ORDER — OXYCODONE-ACETAMINOPHEN 10-325 MG PO TABS: 1.0000 | ORAL_TABLET | Freq: Four times a day (QID) | ORAL | 0 refills | Status: DC | PRN

## 2019-06-18 MED ORDER — DOXYCYCLINE HYCLATE 100 MG PO TABS: 100.0000 mg | ORAL_TABLET | Freq: Two times a day (BID) | ORAL | 0 refills | Status: DC

## 2019-06-18 NOTE — Telephone Encounter (Signed)
Please advise, thank you.

## 2019-06-18 NOTE — Telephone Encounter (Signed)
Tanzania from [facility] called. Says they need a RX for the spot for Doxy and for the Lipitor increase. Her call back number is [phone] and fax is [phone]

## 2019-06-18 NOTE — Telephone Encounter (Signed)
Rx written for doxycycline.  We did not increase his Lipitor.  As per patient request we requested they increase his oxycodone to 20 mg 4 times a day.

## 2019-06-18 NOTE — Progress Notes (Signed)
Office Visit Note   Patient: Gregory [name]           Date of Birth: Sep 19, 1960           MRN: [id number] Visit Date: 06/18/2019              Requested by: [name], MD [place],  [place] [id number] PCP: [name], MD  Chief Complaint  Patient presents with  . Right Leg - Follow-up      HPI: The patient presents in follow-up today he is almost 3 weeks status post right above-knee amputation revision.  As he comes in he is noted to be rubbing on his wound with his bare hand.  He says he has extreme pain and swelling.  He is asking for increased pain medication.  He also removed the wound VAC against our advice   Assessment & Plan: Visit Diagnoses: No diagnosis found.  Plan: We have recommended that they increase his dosing of oxycodone to 20 mg 4 times daily.  He is also to have Silvadene dressing changes daily.  He also will begin doxycycline 100 mg twice a day we also counseled him on the importance of not touching his wound with his bare hands or his hand at all he understands this will significantly increase the risk of infection.  He says he has to do this to help with his pain and that he could not tolerate the wound VAC because he is claustrophobic  Follow-Up Instructions: No follow-ups on file.   Ortho Exam  Patient is alert, oriented, no adenopathy, well-dressed, normal affect, normal respiratory effort. Moderate amount of soft tissue swelling the wound has dehisced in the central portion there is a mild odor.  Patient is noted to be touching the wound  Imaging: No results found. No images are attached to the encounter.  Labs: Lab Results  Component Value Date   HGBA1C 8.6 (H) 05/23/2019   HGBA1C 7.7 (H) 11/17/2018   HGBA1C 8.0 (H) 06/04/2018   ESRSEDRATE 102 (H) 05/27/2019   ESRSEDRATE 120 (H) 03/25/2017   ESRSEDRATE 84 (H) 10/02/2016   CRP 10.2 (H) 05/27/2019   CRP 4.9 (H) 03/25/2017   CRP 30.6 (H) 10/02/2016   REPTSTATUS  06/03/2019 FINAL 05/29/2019   GRAMSTAIN  05/29/2019    RARE WBC PRESENT, PREDOMINANTLY PMN NO ORGANISMS SEEN    CULT  05/29/2019    No growth aerobically or anaerobically. Performed at [facility], [place] [address], [place], [place] [address]    LABORGA PROVIDENCIA [name] (A) 01/12/2017     Lab Results  Component Value Date   ALBUMIN 3.3 (L) 05/22/2019   ALBUMIN 3.3 (L) 10/22/2018   ALBUMIN 2.2 (L) 06/04/2018   PREALBUMIN 23.3 03/25/2017    Lab Results  Component Value Date   MG 1.6 (L) 05/30/2019   MG 1.7 05/29/2019   MG 1.6 (L) 05/28/2019   No results found for: VD25OH  Lab Results  Component Value Date   PREALBUMIN 23.3 03/25/2017   CBC EXTENDED Latest Ref Rng & Units 05/30/2019 05/29/2019 05/28/2019  WBC 4.0 - 10.5 K/uL 12.0(H) 7.4 6.7  RBC 4.22 - 5.81 MIL/uL 3.43(L) 4.03(L) 3.71(L)  HGB 13.0 - 17.0 g/dL 8.8(L) 10.3(L) 9.5(L)  HCT 39.0 - 52.0 % 29.1(L) 34.5(L) 31.3(L)  PLT 150 - 400 K/uL 323 260 246  NEUTROABS 1.7 - 7.7 K/uL - - -  LYMPHSABS 0.7 - 4.0 K/uL - - -     Body mass  index is 39.94 kg/m.  Orders:  No orders of the defined types were placed in this encounter.  No orders of the defined types were placed in this encounter.    Procedures: No procedures performed  Clinical Data: No additional findings.  ROS:  All other systems negative, except as noted in the HPI. Review of Systems  Objective: Vital Signs: Ht 5\' 5"  (1.651 m)   Wt 240 lb (108.9 kg)   BMI 39.94 kg/m   Specialty Comments:  No specialty comments available.  PMFS History: Patient Active Problem List   Diagnosis Date Noted  . CKD (chronic kidney disease), stage III 05/23/2019  . Tobacco use 05/23/2019  . Sepsis ([place]) 05/23/2019  . Acquired absence of right lower extremity above knee ([place]) 12/05/2018  . Amputation stump infection ([place])   . Lymphedema of left leg   . Diabetic polyneuropathy associated with type 2 diabetes mellitus ([place])   . Below-knee  amputation of right lower extremity ([place]) 10/22/2018  . Dehiscence of amputation stump ([place])   . 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[place]) 08/12/2018  . Candidiasis 08/12/2018  . Necrosis of toe ([place]) 07/08/2018  . Amputated toe of left foot ([place]) 06/18/2018  . Streptococcal infection   . Pressure injury of skin 06/06/2018  . Cutaneous abscess of left foot   . Moderate protein-calorie malnutrition ([place])   . Cellulitis of left leg   . Septic arthritis ([place]) 06/03/2018  . Idiopathic chronic venous hypertension of left lower extremity with ulcer and inflammation ([place]) 11/14/2017  . Great toe amputation status, left 11/14/2017  . Acquired absence of right leg below knee ([place]) 06/20/2017  . Osteomyelitis ([place]) 03/25/2017  . Skin cancer 03/25/2017  . Depression, recurrent (Cla[place]on) 11/05/2016  . Diabetic ulcer of toe of left foot ([place]) 10/02/2016  . 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 Chronic pain 09/27/2016  . Dyslipidemia associated with type 2 diabetes mellitus ([place]) 06/06/2016  . GERD without esophagitis 05/11/2016  . Hypertensive heart disease with congestive heart failure ([place]) 12/15/2015  . Spinal stenosis in cervical region 12/15/2015  . Type II diabetes mellitus with neurological manifestations ([place]) 12/15/2015  . Chronic diastolic CHF (congestive heart failure) ([place])   . Pulmonary hypertension ([place])   . Urinary retention   . Diskitis   . Foot drop, bilateral   . Sepsis with acute renal failure without septic shock ([place]in[place])   . MRSA bacteremia 10/03/2015  . COPD (chronic obstructive pulmonary disease) ([place]) 10/03/2015  . Acute osteomyelitis of left foot ([facility]) 10/03/2015   Past Medical History:  Diagnosis Date  . Acquired absence of left great toe ([place]aka)   . Acquired absence of right leg below knee ([place])   . Acute osteomyelitis of calcaneum, right ([place]ora) 10/02/2016  . Acute respiratory failure ([place])   . Amputation stump infection ([place]) 08/12/2018  . Anemia   . Basal cell carcinoma,  eyelid   . Cancer ([place])   . Candidiasis 08/12/2018  . CHF (congestive heart failure) (HCC)    chronic diastolic   . Chronic back pain   . Chronic kidney disease    stage 3   . Chronic multifocal osteomyelitis (HCC)    of ankle and foot   . Chronic pain syndrome   . COPD (chronic obstructive pulmonary disease) ([place])   . DDD (degenerative disc disease), lumbar   . Depression    major depressive disorder   . Diabetes mellitus without complication ([place]ley)    type 2   . Diabetic ulcer of toe  of left foot ([place]alt) 10/02/2016  . Difficulty in walking, not elsewhere classified   . 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 Foot drop, bilateral   . Foot drop, right 12/27/2015  . GERD (gastroesophageal reflux disease)   . Herpesviral vesicular dermatitis   . Hyperlipidemia   . Hyperlipidemia   . Hypertension   . Insomnia   . Malignant melanoma of other parts of face (Gall[place])   . MRSA bacteremia   . Muscle weakness (generalized)   . Nasal congestion   . Necrosis of toe ([place]bon[place]) 07/08/2018  . Neuromuscular dysfunction of bladder   . Osteomyelitis ([place] Grove)   . Osteomyelitis ([place])   . Osteomyelitis ([place])    right BKA  . Other idiopathic peripheral autonomic neuropathy   . Retention of urine, unspecified   . Squamous cell carcinoma of skin   . Unsteadiness on feet   . Urinary retention   . Urinary retention   . Urinary tract infection   . Wears dentures   . Wears glasses     Family History  Problem Relation Age of Onset  . Cancer Other   . Diabetes Other     Past Surgical History:  Procedure Laterality Date  . AMPUTATION Left 03/29/2017   Procedure: LEFT GREAT TOE AMPUTATION AT METATARSOPHALANGEAL JOINT;  Surgeon: [name], MD;  Location: [facility];  Service: Orthopedics;  Laterality: Left;  . AMPUTATION Right 03/29/2017   Procedure: RIGHT BELOW KNEE AMPUTATION;  Surgeon: [name], MD;  Location: [facility];  Service: Orthopedics;  Laterality: Right;  . AMPUTATION Left 06/06/2018   Procedure: LEFT  2ND TOE AMPUTATION;  Surgeon: [name], MD;  Location: [facility];  Service: Orthopedics;  Laterality: Left;  . AMPUTATION Right 11/19/2018   Procedure: AMPUTATION ABOVE KNEE;  Surgeon: [name], MD;  Location: [facility];  Service: Orthopedics;  Laterality: Right;  . ANTERIOR CERVICAL CORPECTOMY N/A 11/25/2015   Procedure: ANTERIOR CERVICAL FIVE CORPECTOMY Cervical four - six fusion;  Surgeon: Consuella Lose, MD;  Location: [facility];  Service: Neurosurgery;  Laterality: N/A;  ANTERIOR CERVICAL FIVE CORPECTOMY Cervical four - six fusion  . APPLICATION OF WOUND VAC Right 10/22/2018   Procedure: Application Of Wound Vac;  Surgeon: [name], MD;  Location: [facility];  Service: Orthopedics;  Laterality: Right;  . CYSTOSCOPY WITH RETROGRADE URETHROGRAM N/A 04/25/2018   Procedure: CYSTOSCOPY WITH RETROGRADE URETHROGRAM/ BALLOON DILATION;  Surgeon: Kathie Rhodes, MD;  [facility];  Service: Urology;  Laterality: N/A;  . MULTIPLE TOOTH EXTRACTIONS    . POSTERIOR CERVICAL FUSION/FORAMINOTOMY N/A 11/29/2015   Procedure: Cervical Three-Cervical Seven Posterior Cervical Laminectomy with Fusion;  Surgeon: Consuella Lose, MD;  Location: [facility];  Service: Neurosurgery;  Laterality: N/A;  Cervical Three-Cervical Seven Posterior Cervical Laminectomy with Fusion  . STUMP REVISION Right 10/22/2018   Procedure: REVISION RIGHT BELOW KNEE AMPUTATION;  Surgeon: [name], MD;  Location: [facility];  Service: Orthopedics;  Laterality: Right;  . STUMP REVISION Right 12/05/2018   Procedure: Revision Right Above Knee Amputation;  Surgeon: [name], MD;  Location: [facility];  Service: Orthopedics;  Laterality: Right;  . STUMP REVISION Right 05/29/2019   Procedure: REVISION RIGHT ABOVE KNEE AMPUTATION;  Surgeon: [name], MD;  Location: [facility];  Service: Orthopedics;  Laterality: Right;   Social History   Occupational History  . Not on file  Tobacco Use  . Smoking status: Current Every Day Smoker     Packs/day: 0.50  Types: Cigarettes  . Smokeless tobacco: Never Used  . Tobacco comment: 11/19/18  Substance and Sexual Activity  . Alcohol use: No  . Drug use: No  . Sexual activity: Not on file

## 2019-06-18 NOTE — Telephone Encounter (Signed)
Patient resides at [facility] at [place], [place] . Both Rx's will be faxed for Oxycodone and Doxy to [facility] at [phone].

## 2019-06-18 NOTE — Telephone Encounter (Signed)
Will fax Rx for Doxycycline to listed number [phone].

## 2019-06-22 ENCOUNTER — Encounter (HOSPITAL_COMMUNITY): Payer: Self-pay | Admitting: Emergency Medicine

## 2019-06-22 ENCOUNTER — Ambulatory Visit: Payer: Medicaid Other | Admitting: Orthopedic Surgery

## 2019-06-22 ENCOUNTER — Inpatient Hospital Stay (HOSPITAL_COMMUNITY)
Admission: EM | Admit: 2019-06-22 | Discharge: 2019-06-27 | DRG: 475 | Disposition: A | Discharge status: Skilled Nursing Facility | Payer: Medicaid Other | Source: Skilled Nursing Facility | Attending: Internal Medicine | Admitting: Internal Medicine

## 2019-06-22 ENCOUNTER — Other Ambulatory Visit: Payer: Self-pay

## 2019-06-22 DIAGNOSIS — E785 Hyperlipidemia, unspecified: Secondary | ICD-10-CM | POA: Diagnosis present

## 2019-06-22 DIAGNOSIS — E1152 Type 2 diabetes mellitus with diabetic peripheral angiopathy with gangrene: Secondary | ICD-10-CM | POA: Diagnosis present

## 2019-06-22 DIAGNOSIS — I5032 Chronic diastolic (congestive) heart failure: Secondary | ICD-10-CM | POA: Diagnosis present

## 2019-06-22 DIAGNOSIS — D631 Anemia in chronic kidney disease: Secondary | ICD-10-CM | POA: Diagnosis present

## 2019-06-22 DIAGNOSIS — G894 Chronic pain syndrome: Secondary | ICD-10-CM | POA: Diagnosis present

## 2019-06-22 DIAGNOSIS — Z89611 Acquired absence of right leg above knee: Secondary | ICD-10-CM | POA: Diagnosis not present

## 2019-06-22 DIAGNOSIS — Z89412 Acquired absence of left great toe: Secondary | ICD-10-CM

## 2019-06-22 DIAGNOSIS — T8743 Infection of amputation stump, right lower extremity: Secondary | ICD-10-CM | POA: Diagnosis present

## 2019-06-22 DIAGNOSIS — E1122 Type 2 diabetes mellitus with diabetic chronic kidney disease: Secondary | ICD-10-CM | POA: Diagnosis present

## 2019-06-22 DIAGNOSIS — J449 Chronic obstructive pulmonary disease, unspecified: Secondary | ICD-10-CM | POA: Diagnosis present

## 2019-06-22 DIAGNOSIS — I10 Essential (primary) hypertension: Secondary | ICD-10-CM | POA: Diagnosis not present

## 2019-06-22 DIAGNOSIS — Y835 Amputation of limb(s) as the cause of abnormal reaction of the patient, or of later complication, without mention of misadventure at the time of the procedure: Secondary | ICD-10-CM | POA: Diagnosis present

## 2019-06-22 DIAGNOSIS — K30 Functional dyspepsia: Secondary | ICD-10-CM | POA: Diagnosis not present

## 2019-06-22 DIAGNOSIS — Z981 Arthrodesis status: Secondary | ICD-10-CM

## 2019-06-22 DIAGNOSIS — F419 Anxiety disorder, unspecified: Secondary | ICD-10-CM | POA: Diagnosis present

## 2019-06-22 DIAGNOSIS — Z89422 Acquired absence of other left toe(s): Secondary | ICD-10-CM | POA: Diagnosis not present

## 2019-06-22 DIAGNOSIS — Z20822 Contact with and (suspected) exposure to covid-19: Secondary | ICD-10-CM | POA: Diagnosis present

## 2019-06-22 DIAGNOSIS — D638 Anemia in other chronic diseases classified elsewhere: Secondary | ICD-10-CM | POA: Diagnosis not present

## 2019-06-22 DIAGNOSIS — I130 Hypertensive heart and chronic kidney disease with heart failure and stage 1 through stage 4 chronic kidney disease, or unspecified chronic kidney disease: Secondary | ICD-10-CM | POA: Diagnosis present

## 2019-06-22 DIAGNOSIS — N1832 Chronic kidney disease, stage 3b: Secondary | ICD-10-CM | POA: Diagnosis present

## 2019-06-22 DIAGNOSIS — N183 Chronic kidney disease, stage 3 unspecified: Secondary | ICD-10-CM | POA: Diagnosis not present

## 2019-06-22 DIAGNOSIS — I96 Gangrene, not elsewhere classified: Secondary | ICD-10-CM | POA: Diagnosis present

## 2019-06-22 DIAGNOSIS — Z1611 Resistance to penicillins: Secondary | ICD-10-CM | POA: Diagnosis present

## 2019-06-22 DIAGNOSIS — F329 Major depressive disorder, single episode, unspecified: Secondary | ICD-10-CM | POA: Diagnosis present

## 2019-06-22 DIAGNOSIS — F17210 Nicotine dependence, cigarettes, uncomplicated: Secondary | ICD-10-CM | POA: Diagnosis present

## 2019-06-22 DIAGNOSIS — Z794 Long term (current) use of insulin: Secondary | ICD-10-CM

## 2019-06-22 DIAGNOSIS — L03115 Cellulitis of right lower limb: Secondary | ICD-10-CM | POA: Diagnosis present

## 2019-06-22 DIAGNOSIS — L089 Local infection of the skin and subcutaneous tissue, unspecified: Secondary | ICD-10-CM | POA: Diagnosis not present

## 2019-06-22 DIAGNOSIS — Z1623 Resistance to quinolones and fluoroquinolones: Secondary | ICD-10-CM | POA: Diagnosis present

## 2019-06-22 DIAGNOSIS — N179 Acute kidney failure, unspecified: Secondary | ICD-10-CM | POA: Diagnosis present

## 2019-06-22 DIAGNOSIS — T8781 Dehiscence of amputation stump: Secondary | ICD-10-CM | POA: Diagnosis present

## 2019-06-22 DIAGNOSIS — L0291 Cutaneous abscess, unspecified: Secondary | ICD-10-CM | POA: Diagnosis not present

## 2019-06-22 DIAGNOSIS — T148XXA Other injury of unspecified body region, initial encounter: Secondary | ICD-10-CM | POA: Diagnosis not present

## 2019-06-22 LAB — COMPREHENSIVE METABOLIC PANEL
ALT: 11 U/L (ref 0–44)
AST: 10 U/L — ABNORMAL LOW (ref 15–41)
Albumin: 2.6 g/dL — ABNORMAL LOW (ref 3.5–5.0)
Alkaline Phosphatase: 90 U/L (ref 38–126)
Anion gap: 10 (ref 5–15)
BUN: 26 mg/dL — ABNORMAL HIGH (ref 6–20)
CO2: 27 mmol/L (ref 22–32)
Calcium: 8.9 mg/dL (ref 8.9–10.3)
Chloride: 101 mmol/L (ref 98–111)
Creatinine, Ser: 1.9 mg/dL — ABNORMAL HIGH (ref 0.61–1.24)
GFR calc Af Amer: 43 mL/min — ABNORMAL LOW (ref >60)
GFR calc non Af Amer: 37 mL/min — ABNORMAL LOW (ref >60)
Glucose, Bld: 147 mg/dL — ABNORMAL HIGH (ref 70–99)
Potassium: 4.3 mmol/L (ref 3.5–5.1)
Sodium: 138 mmol/L (ref 135–145)
Total Bilirubin: 0.4 mg/dL (ref 0.3–1.2)
Total Protein: 7.2 g/dL (ref 6.5–8.1)

## 2019-06-22 LAB — LACTIC ACID, PLASMA: Lactic Acid, Venous: 0.9 mmol/L (ref 0.5–1.9)

## 2019-06-22 LAB — CBC WITH DIFFERENTIAL/PLATELET
Abs Immature Granulocytes: 0.06 10*3/uL (ref 0.00–0.07)
Basophils Absolute: 0 10*3/uL (ref 0.0–0.1)
Basophils Relative: 0 %
Eosinophils Absolute: 0.3 10*3/uL (ref 0.0–0.5)
Eosinophils Relative: 3 %
HCT: 32.2 % — ABNORMAL LOW (ref 39.0–52.0)
Hemoglobin: 9.4 g/dL — ABNORMAL LOW (ref 13.0–17.0)
Immature Granulocytes: 1 %
Lymphocytes Relative: 17 %
Lymphs Abs: 1.8 10*3/uL (ref 0.7–4.0)
MCH: 26 pg (ref 26.0–34.0)
MCHC: 29.2 g/dL — ABNORMAL LOW (ref 30.0–36.0)
MCV: 89.2 fL (ref 80.0–100.0)
Monocytes Absolute: 0.8 10*3/uL (ref 0.1–1.0)
Monocytes Relative: 8 %
Neutro Abs: 7.6 10*3/uL (ref 1.7–7.7)
Neutrophils Relative %: 71 %
Platelets: 308 10*3/uL (ref 150–400)
RBC: 3.61 MIL/uL — ABNORMAL LOW (ref 4.22–5.81)
RDW: 17.2 % — ABNORMAL HIGH (ref 11.5–15.5)
WBC: 10.6 10*3/uL — ABNORMAL HIGH (ref 4.0–10.5)
nRBC: 0 % (ref 0.0–0.2)

## 2019-06-22 LAB — GLUCOSE, CAPILLARY: Glucose-Capillary: 202 mg/dL — ABNORMAL HIGH (ref 70–99)

## 2019-06-22 LAB — URINALYSIS, ROUTINE W REFLEX MICROSCOPIC
Bacteria, UA: NONE SEEN
Bilirubin Urine: NEGATIVE
Glucose, UA: NEGATIVE mg/dL
Hgb urine dipstick: NEGATIVE
Ketones, ur: NEGATIVE mg/dL
Leukocytes,Ua: NEGATIVE
Nitrite: NEGATIVE
Protein, ur: 30 mg/dL — AB
Specific Gravity, Urine: 1.014 (ref 1.005–1.030)
pH: 5 (ref 5.0–8.0)

## 2019-06-22 LAB — PROTIME-INR
INR: 1 (ref 0.8–1.2)
Prothrombin Time: 13 seconds (ref 11.4–15.2)

## 2019-06-22 MED ORDER — CYANOCOBALAMIN 500 MCG PO TABS
1000.0000 ug | ORAL_TABLET | Freq: Every day | ORAL | Status: DC
  Administered 2019-06-23 – 2019-06-27 (×5): 1000 ug via ORAL
  Filled 2019-06-22 (×5): qty 2

## 2019-06-22 MED ORDER — MORPHINE SULFATE (PF) 4 MG/ML IV SOLN
4.0000 mg | Freq: Once | INTRAVENOUS | Status: AC
  Administered 2019-06-22: 4 mg via INTRAVENOUS
  Filled 2019-06-22: qty 1

## 2019-06-22 MED ORDER — TIOTROPIUM BROMIDE MONOHYDRATE 1.25 MCG/ACT IN AERS: 2.0000 | INHALATION_SPRAY | Freq: Every day | RESPIRATORY_TRACT | Status: DC

## 2019-06-22 MED ORDER — UMECLIDINIUM BROMIDE 62.5 MCG/INH IN AEPB
1.0000 | INHALATION_SPRAY | Freq: Every day | RESPIRATORY_TRACT | Status: DC
  Administered 2019-06-23 – 2019-06-27 (×4): 1 via RESPIRATORY_TRACT
  Filled 2019-06-22: qty 7

## 2019-06-22 MED ORDER — PREGABALIN 75 MG PO CAPS
150.0000 mg | ORAL_CAPSULE | Freq: Two times a day (BID) | ORAL | Status: DC
  Administered 2019-06-22 – 2019-06-27 (×10): 150 mg via ORAL
  Filled 2019-06-22 (×10): qty 2

## 2019-06-22 MED ORDER — NICOTINE 14 MG/24HR TD PT24
14.0000 mg | MEDICATED_PATCH | Freq: Every day | TRANSDERMAL | Status: DC
  Administered 2019-06-22 – 2019-06-26 (×5): 14 mg via TRANSDERMAL
  Filled 2019-06-22 (×5): qty 1

## 2019-06-22 MED ORDER — POLYETHYLENE GLYCOL 3350 17 G PO PACK: 17.0000 g | PACK | Freq: Every day | ORAL | Status: DC | PRN

## 2019-06-22 MED ORDER — BISACODYL 10 MG RE SUPP: 10.0000 mg | Freq: Every day | RECTAL | Status: DC | PRN

## 2019-06-22 MED ORDER — MAGNESIUM OXIDE 400 (241.3 MG) MG PO TABS
400.0000 mg | ORAL_TABLET | Freq: Two times a day (BID) | ORAL | Status: DC
  Administered 2019-06-22 – 2019-06-27 (×10): 400 mg via ORAL
  Filled 2019-06-22 (×10): qty 1

## 2019-06-22 MED ORDER — OXYBUTYNIN CHLORIDE ER 15 MG PO TB24
15.0000 mg | ORAL_TABLET | Freq: Every day | ORAL | Status: DC
  Administered 2019-06-22 – 2019-06-26 (×5): 15 mg via ORAL
  Filled 2019-06-22 (×7): qty 1

## 2019-06-22 MED ORDER — METOPROLOL TARTRATE 50 MG PO TABS
50.0000 mg | ORAL_TABLET | Freq: Every day | ORAL | Status: DC
  Administered 2019-06-23 – 2019-06-27 (×5): 50 mg via ORAL
  Filled 2019-06-22 (×4): qty 1
  Filled 2019-06-22: qty 2

## 2019-06-22 MED ORDER — ATORVASTATIN CALCIUM 10 MG PO TABS
20.0000 mg | ORAL_TABLET | Freq: Every day | ORAL | Status: DC
  Administered 2019-06-22 – 2019-06-26 (×5): 20 mg via ORAL
  Filled 2019-06-22 (×5): qty 2

## 2019-06-22 MED ORDER — DOCUSATE SODIUM 100 MG PO CAPS
100.0000 mg | ORAL_CAPSULE | Freq: Two times a day (BID) | ORAL | Status: DC
  Administered 2019-06-22 – 2019-06-27 (×10): 100 mg via ORAL
  Filled 2019-06-22 (×10): qty 1

## 2019-06-22 MED ORDER — LORATADINE 10 MG PO TABS
10.0000 mg | ORAL_TABLET | Freq: Every day | ORAL | Status: DC
  Administered 2019-06-23 – 2019-06-27 (×5): 10 mg via ORAL
  Filled 2019-06-22 (×5): qty 1

## 2019-06-22 MED ORDER — CALCIUM CARBONATE ANTACID 500 MG PO CHEW
2.0000 | CHEWABLE_TABLET | Freq: Three times a day (TID) | ORAL | Status: DC | PRN
  Administered 2019-06-25: 400 mg via ORAL
  Filled 2019-06-22: qty 2

## 2019-06-22 MED ORDER — OXYCODONE-ACETAMINOPHEN 5-325 MG PO TABS
1.0000 | ORAL_TABLET | ORAL | Status: AC | PRN
  Administered 2019-06-22 (×2): 1 via ORAL
  Filled 2019-06-22 (×2): qty 1

## 2019-06-22 MED ORDER — ONDANSETRON HCL 4 MG PO TABS
4.0000 mg | ORAL_TABLET | Freq: Four times a day (QID) | ORAL | Status: DC | PRN
  Administered 2019-06-23 – 2019-06-26 (×6): 4 mg via ORAL
  Filled 2019-06-22 (×6): qty 1

## 2019-06-22 MED ORDER — VANCOMYCIN HCL 2000 MG/400ML IV SOLN
2000.0000 mg | Freq: Once | INTRAVENOUS | Status: AC
  Administered 2019-06-22: 2000 mg via INTRAVENOUS
  Filled 2019-06-22: qty 400

## 2019-06-22 MED ORDER — TIZANIDINE HCL 2 MG PO TABS
4.0000 mg | ORAL_TABLET | Freq: Three times a day (TID) | ORAL | Status: DC | PRN
  Administered 2019-06-22 – 2019-06-27 (×10): 4 mg via ORAL
  Filled 2019-06-22: qty 1
  Filled 2019-06-22: qty 2
  Filled 2019-06-22 (×2): qty 1
  Filled 2019-06-22 (×2): qty 2
  Filled 2019-06-22: qty 1
  Filled 2019-06-22 (×2): qty 2
  Filled 2019-06-22: qty 1
  Filled 2019-06-22: qty 2
  Filled 2019-06-22: qty 1
  Filled 2019-06-22: qty 2

## 2019-06-22 MED ORDER — OXYCODONE HCL 5 MG PO TABS
5.0000 mg | ORAL_TABLET | ORAL | Status: DC | PRN
  Administered 2019-06-22 – 2019-06-23 (×3): 5 mg via ORAL
  Filled 2019-06-22 (×3): qty 1

## 2019-06-22 MED ORDER — VANCOMYCIN HCL 1500 MG/300ML IV SOLN: 1500.0000 mg | Freq: Once | INTRAVENOUS | Status: DC

## 2019-06-22 MED ORDER — FERROUS SULFATE 325 (65 FE) MG PO TABS
325.0000 mg | ORAL_TABLET | Freq: Every day | ORAL | Status: DC
  Administered 2019-06-23 – 2019-06-27 (×4): 325 mg via ORAL
  Filled 2019-06-22 (×5): qty 1

## 2019-06-22 MED ORDER — OXYCODONE HCL 5 MG PO TABS
5.0000 mg | ORAL_TABLET | Freq: Once | ORAL | Status: AC
  Administered 2019-06-22: 5 mg via ORAL
  Filled 2019-06-22: qty 1

## 2019-06-22 MED ORDER — ZOLPIDEM TARTRATE 5 MG PO TABS
5.0000 mg | ORAL_TABLET | Freq: Every evening | ORAL | Status: DC | PRN
  Administered 2019-06-22 – 2019-06-24 (×4): 5 mg via ORAL
  Filled 2019-06-22 (×4): qty 1

## 2019-06-22 MED ORDER — ONDANSETRON HCL 4 MG/2ML IJ SOLN
4.0000 mg | Freq: Once | INTRAMUSCULAR | Status: AC
  Administered 2019-06-22: 4 mg via INTRAVENOUS
  Filled 2019-06-22: qty 2

## 2019-06-22 MED ORDER — PIPERACILLIN-TAZOBACTAM 3.375 G IVPB 30 MIN
3.3750 g | Freq: Once | INTRAVENOUS | Status: AC
  Administered 2019-06-22: 3.375 g via INTRAVENOUS
  Filled 2019-06-22: qty 50

## 2019-06-22 MED ORDER — VANCOMYCIN HCL 750 MG/150ML IV SOLN
750.0000 mg | INTRAVENOUS | Status: DC
  Filled 2019-06-22: qty 150

## 2019-06-22 MED ORDER — SODIUM CHLORIDE 0.9 % IV SOLN
2.0000 g | Freq: Two times a day (BID) | INTRAVENOUS | Status: DC
  Administered 2019-06-23 (×2): 2 g via INTRAVENOUS
  Filled 2019-06-22 (×4): qty 2

## 2019-06-22 MED ORDER — FINASTERIDE 5 MG PO TABS
5.0000 mg | ORAL_TABLET | Freq: Every day | ORAL | Status: DC
  Administered 2019-06-23 – 2019-06-27 (×5): 5 mg via ORAL
  Filled 2019-06-22 (×5): qty 1

## 2019-06-22 MED ORDER — BUPROPION HCL ER (XL) 150 MG PO TB24
300.0000 mg | ORAL_TABLET | Freq: Every day | ORAL | Status: DC
  Administered 2019-06-22 – 2019-06-26 (×5): 300 mg via ORAL
  Filled 2019-06-22 (×5): qty 2

## 2019-06-22 MED ORDER — CETYLPYRIDINIUM CHLORIDE 0.05 % MT LIQD: OROMUCOSAL | Status: DC | PRN

## 2019-06-22 MED ORDER — AMLODIPINE BESYLATE 10 MG PO TABS
10.0000 mg | ORAL_TABLET | Freq: Every day | ORAL | Status: DC
  Administered 2019-06-23 – 2019-06-27 (×5): 10 mg via ORAL
  Filled 2019-06-22: qty 2
  Filled 2019-06-22 (×4): qty 1

## 2019-06-22 MED ORDER — FUROSEMIDE 20 MG PO TABS: 40.0000 mg | ORAL_TABLET | Freq: Every day | ORAL | Status: DC

## 2019-06-22 MED ORDER — SENNOSIDES-DOCUSATE SODIUM 8.6-50 MG PO TABS
2.0000 | ORAL_TABLET | Freq: Every day | ORAL | Status: DC
  Administered 2019-06-22 – 2019-06-26 (×5): 2 via ORAL
  Filled 2019-06-22 (×5): qty 2

## 2019-06-22 MED ORDER — HYDROMORPHONE HCL 1 MG/ML IJ SOLN
1.0000 mg | INTRAMUSCULAR | Status: DC | PRN
  Administered 2019-06-22 – 2019-06-25 (×15): 1 mg via INTRAVENOUS
  Filled 2019-06-22 (×16): qty 1

## 2019-06-22 MED ORDER — HYDROMORPHONE HCL 1 MG/ML IJ SOLN: 1.0000 mg | INTRAMUSCULAR | Status: DC | PRN

## 2019-06-22 MED ORDER — INSULIN ASPART 100 UNIT/ML ~~LOC~~ SOLN
0.0000 [IU] | Freq: Three times a day (TID) | SUBCUTANEOUS | Status: DC
  Administered 2019-06-23: 5 [IU] via SUBCUTANEOUS
  Administered 2019-06-23 (×2): 3 [IU] via SUBCUTANEOUS
  Administered 2019-06-24: 5 [IU] via SUBCUTANEOUS
  Administered 2019-06-24 – 2019-06-25 (×2): 8 [IU] via SUBCUTANEOUS
  Administered 2019-06-25 – 2019-06-26 (×4): 5 [IU] via SUBCUTANEOUS
  Administered 2019-06-26: 3 [IU] via SUBCUTANEOUS
  Administered 2019-06-27: 5 [IU] via SUBCUTANEOUS
  Administered 2019-06-27: 3 [IU] via SUBCUTANEOUS

## 2019-06-22 MED ORDER — SODIUM CHLORIDE 0.9 % IV SOLN
2.0000 g | Freq: Once | INTRAVENOUS | Status: AC
  Administered 2019-06-22: 2 g via INTRAVENOUS
  Filled 2019-06-22: qty 2

## 2019-06-22 MED ORDER — HEPARIN SODIUM (PORCINE) 5000 UNIT/ML IJ SOLN
5000.0000 [IU] | Freq: Three times a day (TID) | INTRAMUSCULAR | Status: DC
  Administered 2019-06-22 – 2019-06-27 (×13): 5000 [IU] via SUBCUTANEOUS
  Filled 2019-06-22 (×13): qty 1

## 2019-06-22 MED ORDER — PANTOPRAZOLE SODIUM 40 MG PO TBEC
40.0000 mg | DELAYED_RELEASE_TABLET | Freq: Every day | ORAL | Status: DC
  Administered 2019-06-23 – 2019-06-27 (×5): 40 mg via ORAL
  Filled 2019-06-22 (×5): qty 1

## 2019-06-22 MED ORDER — FLUTICASONE PROPIONATE 50 MCG/ACT NA SUSP
1.0000 | Freq: Every day | NASAL | Status: DC
  Administered 2019-06-24 – 2019-06-27 (×3): 1 via NASAL
  Filled 2019-06-22: qty 16

## 2019-06-22 MED ORDER — ALBUTEROL SULFATE (2.5 MG/3ML) 0.083% IN NEBU: 3.0000 mL | INHALATION_SOLUTION | Freq: Four times a day (QID) | RESPIRATORY_TRACT | Status: DC | PRN

## 2019-06-22 MED ORDER — PAROXETINE HCL 10 MG PO TABS
10.0000 mg | ORAL_TABLET | Freq: Every day | ORAL | Status: DC
  Administered 2019-06-22 – 2019-06-26 (×5): 10 mg via ORAL
  Filled 2019-06-22 (×7): qty 1

## 2019-06-22 MED ORDER — TAMSULOSIN HCL 0.4 MG PO CAPS
0.8000 mg | ORAL_CAPSULE | Freq: Every evening | ORAL | Status: DC
  Administered 2019-06-23 – 2019-06-26 (×4): 0.8 mg via ORAL
  Filled 2019-06-22 (×4): qty 2

## 2019-06-22 MED ORDER — MIRABEGRON ER 25 MG PO TB24
50.0000 mg | ORAL_TABLET | Freq: Every day | ORAL | Status: DC
  Administered 2019-06-23 – 2019-06-27 (×5): 50 mg via ORAL
  Filled 2019-06-22: qty 2
  Filled 2019-06-22: qty 1
  Filled 2019-06-22 (×2): qty 2
  Filled 2019-06-22: qty 1
  Filled 2019-06-22: qty 2

## 2019-06-22 NOTE — Progress Notes (Signed)
Pt yelling out, states he needs more pain meds. "That oxy 5mg  will not help me, I take Oxy 20mg " Text page on call K.Kirby and received new order to give additional Oxy 5mg  x1.

## 2019-06-22 NOTE — ED Notes (Signed)
Pt is screaming out wanting help. He states his pain is to much and needs more pain meds.

## 2019-06-22 NOTE — Consult Note (Signed)
Reason for Consult:Right AKA infection Referring Physician: Bryden Hoofnagle is an 59 y.o. male.  HPI: Gregory [name] comes to the ED with continued right AKA dehiscence and infection. He's saw [name] and his PA on 12/31 and was placed on doxy. He's been taking that but is no better and likely a little worse. He's having tremendous pain in the leg and copious foul drainage.  Past Medical History:  Diagnosis Date  . Acquired absence of left great toe ([place])   . Acquired absence of right leg below knee (Round Lake)   . Acute osteomyelitis of calcaneum, right (Waynesville) 10/02/2016  . Acute respiratory failure ([place])   . Amputation stump infection (Hartleton) 08/12/2018  . Anemia   . Basal cell carcinoma, eyelid   . Cancer ([place])   . Candidiasis 08/12/2018  . CHF (congestive heart failure) (HCC)    chronic diastolic   . Chronic back pain   . Chronic kidney disease    stage 3   . Chronic multifocal osteomyelitis (HCC)    of ankle and foot   . Chronic pain syndrome   . COPD (chronic obstructive pulmonary disease) ([place])   . DDD (degenerative disc disease), lumbar   . Depression    major depressive disorder   . Diabetes mellitus without complication ([place])    type 2   . Diabetic ulcer of toe of left foot ([place]) 10/02/2016  . Difficulty in walking, not elsewhere classified   . 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 Foot drop, bilateral   . Foot drop, right 12/27/2015  . GERD (gastroesophageal reflux disease)   . Herpesviral vesicular dermatitis   . Hyperlipidemia   . Hyperlipidemia   . Hypertension   . Insomnia   . Malignant melanoma of other parts of face ([place])   . MRSA bacteremia   . Muscle weakness (generalized)   . Nasal congestion   . Necrosis of toe ([place]) 07/08/2018  . Neuromuscular dysfunction of bladder   . Osteomyelitis ([place])   . Osteomyelitis ([place])   . Osteomyelitis ([place])    right BKA  . Other idiopathic peripheral autonomic neuropathy   . Retention of urine, unspecified   . Squamous cell carcinoma  of skin   . Unsteadiness on feet   . Urinary retention   . Urinary retention   . Urinary tract infection   . Wears dentures   . Wears glasses     Past Surgical History:  Procedure Laterality Date  . AMPUTATION Left 03/29/2017   Procedure: LEFT GREAT TOE AMPUTATION AT METATARSOPHALANGEAL JOINT;  Surgeon: [name], MD;  Location: [facility];  Service: Orthopedics;  Laterality: Left;  . AMPUTATION Right 03/29/2017   Procedure: RIGHT BELOW KNEE AMPUTATION;  Surgeon: [name], MD;  Location: [facility];  Service: Orthopedics;  Laterality: Right;  . AMPUTATION Left 06/06/2018   Procedure: LEFT 2ND TOE AMPUTATION;  Surgeon: [name], MD;  Location: [facility];  Service: Orthopedics;  Laterality: Left;  . AMPUTATION Right 11/19/2018   Procedure: AMPUTATION ABOVE KNEE;  Surgeon: [name], MD;  Location: [facility];  Service: Orthopedics;  Laterality: Right;  . ANTERIOR CERVICAL CORPECTOMY N/A 11/25/2015   Procedure: ANTERIOR CERVICAL FIVE CORPECTOMY Cervical four - six fusion;  Surgeon: Consuella Lose, MD;  Location: [facility];  Service: Neurosurgery;  Laterality: N/A;  ANTERIOR CERVICAL FIVE CORPECTOMY Cervical four - six fusion  . APPLICATION OF WOUND VAC Right 10/22/2018   Procedure: Application Of Wound Vac;  Surgeon: [name],  Illene Regulus, MD;  Location: [facility];  Service: Orthopedics;  Laterality: Right;  . CYSTOSCOPY WITH RETROGRADE URETHROGRAM N/A 04/25/2018   Procedure: CYSTOSCOPY WITH RETROGRADE URETHROGRAM/ BALLOON DILATION;  Surgeon: Kathie Rhodes, MD;  [facility];  Service: Urology;  Laterality: N/A;  . MULTIPLE TOOTH EXTRACTIONS    . POSTERIOR CERVICAL FUSION/FORAMINOTOMY N/A 11/29/2015   Procedure: Cervical Three-Cervical Seven Posterior Cervical Laminectomy with Fusion;  Surgeon: Consuella Lose, MD;  Location: [facility];  Service: Neurosurgery;  Laterality: N/A;  Cervical Three-Cervical Seven Posterior Cervical Laminectomy with Fusion  . STUMP REVISION Right 10/22/2018    Procedure: REVISION RIGHT BELOW KNEE AMPUTATION;  Surgeon: [name], MD;  Location: [facility];  Service: Orthopedics;  Laterality: Right;  . STUMP REVISION Right 12/05/2018   Procedure: Revision Right Above Knee Amputation;  Surgeon: [name], MD;  Location: [facility];  Service: Orthopedics;  Laterality: Right;  . STUMP REVISION Right 05/29/2019   Procedure: REVISION RIGHT ABOVE KNEE AMPUTATION;  Surgeon: [name], MD;  Location: [facility];  Service: Orthopedics;  Laterality: Right;    Family History  Problem Relation Age of Onset  . Cancer Other   . Diabetes Other     Social History:  reports that he has been smoking cigarettes. He has been smoking about 0.50 packs per day. He has never used smokeless tobacco. He reports that he does not drink alcohol or use drugs.  Allergies:  Allergies  Allergen Reactions  . Latex Other (See Comments)    Listed on MAR- pt sts he is not allergic.    Medications: I have reviewed the patient's current medications.  Results for orders placed or performed during the hospital encounter of 06/22/19 (from the past 48 hour(s))  Comprehensive metabolic panel     Status: Abnormal   Collection Time: 06/22/19 10:37 AM  Result Value Ref Range   Sodium 138 135 - 145 mmol/L   Potassium 4.3 3.5 - 5.1 mmol/L   Chloride 101 98 - 111 mmol/L   CO2 27 22 - 32 mmol/L   Glucose, Bld 147 (H) 70 - 99 mg/dL   BUN 26 (H) 6 - 20 mg/dL   Creatinine, Ser 1.90 (H) 0.61 - 1.24 mg/dL   Calcium 8.9 8.9 - 10.3 mg/dL   Total Protein 7.2 6.5 - 8.1 g/dL   Albumin 2.6 (L) 3.5 - 5.0 g/dL   AST 10 (L) 15 - 41 U/L   ALT 11 0 - 44 U/L   Alkaline Phosphatase 90 38 - 126 U/L   Total Bilirubin 0.4 0.3 - 1.2 mg/dL   GFR calc non Af Amer 37 (L) >60 mL/min   GFR calc Af Amer 43 (L) >60 mL/min   Anion gap 10 5 - 15    Comment: Performed at [facility], [address], [place], [place] [address]  Lactic acid, plasma     Status: None   Collection Time: 06/22/19 10:37 AM   Result Value Ref Range   Lactic Acid, Venous 0.9 0.5 - 1.9 mmol/L    Comment: Performed at [facility] [address] [place], [place] [address]  CBC with Differential     Status: Abnormal   Collection Time: 06/22/19 10:37 AM  Result Value Ref Range   WBC 10.6 (H) 4.0 - 10.5 K/uL   RBC 3.61 (L) 4.22 - 5.81 MIL/uL   Hemoglobin 9.4 (L) 13.0 - 17.0 g/dL   HCT 32.2 (L) 39.0 - 52.0 %  MCV 89.2 80.0 - 100.0 fL   MCH 26.0 26.0 - 34.0 pg   MCHC 29.2 (L) 30.0 - 36.0 g/dL   RDW 17.2 (H) 11.5 - 15.5 %   Platelets 308 150 - 400 K/uL   nRBC 0.0 0.0 - 0.2 %   Neutrophils Relative % 71 %   Neutro Abs 7.6 1.7 - 7.7 K/uL   Lymphocytes Relative 17 %   Lymphs Abs 1.8 0.7 - 4.0 K/uL   Monocytes Relative 8 %   Monocytes Absolute 0.8 0.1 - 1.0 K/uL   Eosinophils Relative 3 %   Eosinophils Absolute 0.3 0.0 - 0.5 K/uL   Basophils Relative 0 %   Basophils Absolute 0.0 0.0 - 0.1 K/uL   Immature Granulocytes 1 %   Abs Immature Granulocytes 0.06 0.00 - 0.07 K/uL    Comment: Performed at [facility] [address] [place], [place] [address]  Protime-INR     Status: None   Collection Time: 06/22/19 10:37 AM  Result Value Ref Range   Prothrombin Time 13.0 11.4 - 15.2 seconds   INR 1.0 0.8 - 1.2    Comment: (NOTE) INR goal varies based on device and disease states. Performed at [facility], [place] [address] [place], [place] [address]     No results found.  Review of Systems  Constitutional: Negative for chills, diaphoresis and fatigue.  HENT: Negative for ear discharge, ear pain, hearing loss and tinnitus.   Eyes: Negative for photophobia and pain.  Respiratory: Negative for cough and shortness of breath.   Cardiovascular: Negative for chest pain.  Gastrointestinal: Negative for abdominal pain, nausea and vomiting.  Genitourinary: Negative for dysuria, flank pain, frequency and urgency.  Musculoskeletal: Positive for myalgias (Right AKA site). Negative for back pain and  neck pain.  Neurological: Negative for dizziness and headaches.  Hematological: Does not bruise/bleed easily.  Psychiatric/Behavioral: The patient is not nervous/anxious.    Blood pressure 138/69, pulse 77, temperature 98.6 F (37 C), temperature source Oral, resp. rate 19, height 5\' 8"  (1.727 m), weight 99.8 kg, SpO2 93 %. Physical Exam  Constitutional: He appears well-developed and well-nourished. No distress.  HENT:  Head: Normocephalic and atraumatic.  Eyes: Conjunctivae are normal. Right eye exhibits no discharge. Left eye exhibits no discharge. No scleral icterus.  Cardiovascular: Normal rate and regular rhythm.  Respiratory: Effort normal. No respiratory distress.  Musculoskeletal:     Cervical back: Normal range of motion.     Comments: RLE No traumatic wounds, ecchymosis, or rash  S/p AKA, dehiscence (more pronounced laterally) with foul purulent discharge  Neurological: He is alert.  Skin: Skin is warm and dry. He is not diaphoretic.  Psychiatric: He has a normal mood and affect. His behavior is normal.    Assessment/Plan: Right AKA dehiscence/infection -- Will ask medicine to admit, IV abx, will try to get culture. [name] to evaluate later today or in AM.    Lisette Abu, [name] Orthopedic Surgery [phone] 06/22/2019, 4:05 PM

## 2019-06-22 NOTE — H&P (Signed)
History and Physical    Gregory [name] [name] DOB: 1961/03/21 DOA: 06/22/2019  PCP: [name], MD   Patient coming from: Home  I have personally briefly reviewed patient's old medical records in [place]  Chief Complaint: Right leg swelling pain discharge  HPI: Gregory [name] is a 59 y.o. 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  He denies any fever chills.  Recently patient underwent revision of the right above-the-knee amputation, he was discharged home with 1 week of antibiotics on December 13. ED Course: Orthopedic surgeon [name] was contacted, IV antibiotics and possible revision/incision and drainage of the right leg wound.  Review of Systems: As per HPI otherwise 10 point review of systems negative.    Past Medical History:  Diagnosis Date  . Acquired absence of left great toe ([place])   . Acquired absence of right leg below knee ([place])   . Acute osteomyelitis of calcaneum, right ([place]) 10/02/2016  . Acute respiratory failure ([place])   . Amputation stump infection ([place]) 08/12/2018  . Anemia   . Basal cell carcinoma, eyelid   . Cancer ([place])   . Candidiasis 08/12/2018  . CHF (congestive heart failure) (HCC)    chronic diastolic   . Chronic back pain   . Chronic kidney disease    stage 3   . Chronic multifocal osteomyelitis (H[facility])    of ankle and foot   . Chronic pain syndrome   . COPD (chronic obstructive pulmonary disease) ([place])   . DDD (degenerative disc disease), lumbar   . Depression    major depressive disorder   . Diabetes mellitus without complication ([place])    type 2   . Diabetic ulcer of toe of left foot  ([place]) 10/02/2016  . Difficulty in walking, not elsewhere classified   . 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 Foot drop, bilateral   . Foot drop, right 12/27/2015  . GERD (gastroesophageal reflux disease)   . Herpesviral vesicular dermatitis   . Hyperlipidemia   . Hyperlipidemia   . Hypertension   . Insomnia   . Malignant melanoma of other parts of face ([place])   . MRSA bacteremia   . Muscle weakness (generalized)   . Nasal congestion   . Necrosis of toe ([place]) 07/08/2018  . Neuromuscular dysfunction of bladder   . Osteomyelitis ([place])   . Osteomyelitis ([place])   . Osteomyelitis ([place])    right BKA  . Other idiopathic peripheral autonomic neuropathy   . Retention of urine, unspecified   . Squamous cell carcinoma of skin   . Unsteadiness on feet   . Urinary retention   . Urinary retention   . Urinary tract infection   . Wears dentures   . Wears glasses     Past Surgical History:  Procedure Laterality Date  . AMPUTATION Left 03/29/2017   Procedure: LEFT GREAT TOE AMPUTATION AT METATARSOPHALANGEAL JOINT;  Surgeon: [name], MD;  Location: [facility];  Service: Orthopedics;  Laterality: Left;  . AMPUTATION Right 03/29/2017   Procedure: RIGHT BELOW KNEE AMPUTATION;  Surgeon: [name], MD;  Location: [facility];  Service: Orthopedics;  Laterality: Right;  . AMPUTATION Left 06/06/2018   Procedure: LEFT 2ND TOE  AMPUTATION;  Surgeon: [name], MD;  Location: [facility];  Service: Orthopedics;  Laterality: Left;  . AMPUTATION Right 11/19/2018   Procedure: AMPUTATION ABOVE KNEE;  Surgeon: [name], MD;  Location: [facility];  Service: Orthopedics;  Laterality: Right;  . ANTERIOR CERVICAL CORPECTOMY N/A 11/25/2015   Procedure: ANTERIOR CERVICAL FIVE CORPECTOMY Cervical four - six fusion;  Surgeon: Consuella Lose, MD;  Location: [facility];  Service: Neurosurgery;  Laterality: N/A;  ANTERIOR CERVICAL FIVE CORPECTOMY Cervical four - six fusion  . APPLICATION OF WOUND VAC Right 10/22/2018   Procedure:  Application Of Wound Vac;  Surgeon: [name], MD;  Location: [facility];  Service: Orthopedics;  Laterality: Right;  . CYSTOSCOPY WITH RETROGRADE URETHROGRAM N/A 04/25/2018   Procedure: CYSTOSCOPY WITH RETROGRADE URETHROGRAM/ BALLOON DILATION;  Surgeon: Kathie Rhodes, MD;  Location: [facility];  Service: Urology;  Laterality: N/A;  . MULTIPLE TOOTH EXTRACTIONS    . POSTERIOR CERVICAL FUSION/FORAMINOTOMY N/A 11/29/2015   Procedure: Cervical Three-Cervical Seven Posterior Cervical Laminectomy with Fusion;  Surgeon: Consuella Lose, MD;  Location: [facility];  Service: Neurosurgery;  Laterality: N/A;  Cervical Three-Cervical Seven Posterior Cervical Laminectomy with Fusion  . STUMP REVISION Right 10/22/2018   Procedure: REVISION RIGHT BELOW KNEE AMPUTATION;  Surgeon: [name], MD;  Location: [facility];  Service: Orthopedics;  Laterality: Right;  . STUMP REVISION Right 12/05/2018   Procedure: Revision Right Above Knee Amputation;  Surgeon: [name], MD;  Location: [facility];  Service: Orthopedics;  Laterality: Right;  . STUMP REVISION Right 05/29/2019   Procedure: REVISION RIGHT ABOVE KNEE AMPUTATION;  Surgeon: [name], MD;  Location: [facility];  Service: Orthopedics;  Laterality: Right;     reports that he has been smoking cigarettes. He has been smoking about 0.50 packs per day. He has never used smokeless tobacco. He reports that he does not drink alcohol or use drugs.  Allergies  Allergen Reactions  . Latex Other (See Comments)    Listed on MAR- pt sts he is not allergic.    Family History  Problem Relation Age of Onset  . Cancer Other   . Diabetes Other      Prior to Admission medications   Medication Sig Start Date End Date Taking? Authorizing Provider  acetaminophen (TYLENOL) 325 MG tablet Take 650 mg by mouth every 4 (four) hours as needed (general discomfort).     [provider]  albuterol (PROVENTIL HFA;VENTOLIN HFA) 108 (90 Base) MCG/ACT inhaler Inhale 2 puffs into  the lungs every 6 (six) hours as needed for wheezing or shortness of breath.    [provider]  amLODipine (NORVASC) 10 MG tablet Take 10 mg by mouth daily. for HTN    [provider]  atorvastatin (LIPITOR) 20 MG tablet Take 20 mg by mouth at bedtime.    [provider]  bisacodyl (DULCOLAX) 10 MG suppository Place 1 suppository (10 mg total) rectally daily as needed for moderate constipation. 12/02/15   [name], MD  buPROPion (WELLBUTRIN XL) 300 MG 24 hr tablet Take 300 mg by mouth at bedtime.    [provider]  calcium carbonate (TUMS - DOSED IN MG ELEMENTAL CALCIUM) 500 MG chewable tablet Chew 2 tablets by mouth every 8 (eight) hours as needed for indigestion or heartburn.    [provider]  cetirizine (ZYRTEC) 10 MG tablet Take 10 mg by mouth daily.    [provider]  CETYLPYRIDINIUM CHLORIDE MT Use as directed  15 mLs in the mouth or throat every 4 (four) hours as needed (dry mouth).    [provider]  Cholecalciferol (VITAMIN D-3) 5000 units TABS Take 5,000 Units by mouth daily.    [provider]  collagenase (SANTYL) ointment Apply 1 application topically daily. Apply to the affected area daily plus dry dressing 01/20/19   [name], MD  docusate sodium (COLACE) 100 MG capsule Take 1 capsule (100 mg total) by mouth 2 (two) times daily. 05/31/19   Amin, Jeanella Flattery, MD  doxycycline (VIBRA-TABS) 100 MG tablet Take 1 tablet (100 mg total) by mouth 2 (two) times daily. 06/18/19   [name], MD  eszopiclone (LUNESTA) 1 MG TABS tablet Take 1 mg by mouth at bedtime. Take immediately before bedtime    [provider]  ferrous sulfate 325 (65 FE) MG tablet Take 1 tablet (325 mg total) by mouth daily with breakfast. 11/23/18   Matilde Haymaker, MD  finasteride (PROSCAR) 5 MG tablet Take 5 mg by mouth daily.    [provider]  furosemide (LASIX) 40 MG tablet Take 40 mg by mouth daily.    [provider]  glipiZIDE (GLUCOTROL) 10 MG tablet Take 10 mg by mouth daily before breakfast.    [provider]  glucagon (GLUCAGON EMERGENCY) 1 MG injection Inject 1 mg into the muscle once as needed (for CBG <70).     [provider]  insulin lispro (HUMALOG KWIKPEN) 100 UNIT/ML KwikPen Inject 0-10 Units into the skin See admin instructions. Inject 0-10 units per sliding scale 3 times daily before meals; CBG <70 = notify MD, 0-150 = 0 units, 151-200 = 2 units, 201-250 = 4 units, 251-300 = 6 units, 301-350 = 8 units, 351-400 = 10 units, >400 = notify MD    [provider]  lidocaine (LIDODERM) 5 % Place 1 patch onto the skin daily. Apply to Right stump. Remove & Discard patch within 12 hours or as directed by MD    [provider]  magnesium oxide (MAG-OX) 400 MG tablet Take 400 mg by mouth 2 (two) times daily.    [provider]  Menthol, Topical Analgesic, (BIOFREEZE) 4 % GEL Apply 1 application topically every 4 (four) hours as needed (pain). Apply to right stump    [provider]  metoprolol tartrate (LOPRESSOR) 50 MG tablet Take 50 mg by mouth daily.     [provider]  mirabegron ER (MYRBETRIQ) 50 MG TB24 tablet Take 50 mg by mouth daily.    [provider]  mometasone (NASONEX) 50 MCG/ACT nasal spray Place 2 sprays into the nose at bedtime.     [provider]  nicotine (NICODERM CQ - DOSED IN MG/24 HOURS) 14 mg/24hr patch Place 14 mg onto the skin daily.    [provider]  omeprazole (PRILOSEC) 40 MG capsule Take 40 mg by mouth daily before breakfast.     [provider]  ondansetron (ZOFRAN) 4 MG tablet Take 4 mg by mouth every 6 (six) hours as needed for nausea or vomiting.    [provider]  oxybutynin (DITROPAN XL) 15 MG 24 hr tablet Take 15 mg by mouth at bedtime.    [provider]  Oxycodone HCl 20 MG TABS Take 1 tablet (20 mg total) by mouth every 6 (six) hours as  needed (pain). 05/31/19   Amin, Jeanella Flattery, MD  oxyCODONE-acetaminophen (PERCOCET) 10-325 MG tablet Take 1 tablet by mouth every 6 (six)  hours as needed for pain. 06/18/19   [name], MD  PARoxetine (PAXIL) 20 MG tablet Take 10 mg by mouth at bedtime.     [provider]  polyethylene glycol (MIRALAX / GLYCOLAX) packet Take 17 g by mouth daily as needed for mild constipation. 06/09/18   Shelly Coss, MD  pregabalin (LYRICA) 150 MG capsule Take 1 capsule (150 mg total) by mouth 2 (two) times daily. 10/24/18   Rayburn, Neta Mends, [name]  senna-docusate (SENOKOT-S) 8.6-50 MG tablet Take 2 tablets by mouth at bedtime.    [provider]  tamsulosin (FLOMAX) 0.4 MG CAPS capsule Take 0.8 mg by mouth every evening.  11/26/13   [provider]  terbinafine (LAMISIL) 250 MG tablet Take 250 mg by mouth daily.    [provider]  Tiotropium Bromide Monohydrate (SPIRIVA RESPIMAT) 1.25 MCG/ACT AERS Inhale 2 puffs into the lungs daily.    [provider]  tiZANidine (ZANAFLEX) 4 MG tablet Take 4 mg by mouth every 8 (eight) hours as needed for muscle spasms.    [provider]  vitamin B-12 (CYANOCOBALAMIN) 500 MCG tablet Take 1,000 mcg by mouth daily.     [provider]    Physical Exam: Vitals:   06/22/19 1008 06/22/19 1035 06/22/19 1300  BP: (!) 122/57  138/69  Pulse: 79  77  Resp: 14  19  Temp: 98 F (36.7 C)  98.6 F (37 C)  TempSrc:   Oral  SpO2: 97%  93%  Weight:  99.8 kg   Height:  5\' 8"  ([id number] m)     Constitutional: NAD, calm, comfortable Vitals:   06/22/19 1008 06/22/19 1035 06/22/19 1300  BP: (!) 122/57  138/69  Pulse: 79  77  Resp: 14  19  Temp: 98 F (36.7 C)  98.6 F (37 C)  TempSrc:   Oral  SpO2: 97%  93%  Weight:  99.8 kg   Height:  5\' 8"  (1.727 m)    Eyes: PERRL, lids and conjunctivae normal ENMT: Mucous membranes are moist. Posterior pharynx clear of any exudate or lesions.Normal dentition.    Neck: normal, supple, no masses, no thyromegaly Respiratory: clear to auscultation bilaterally, no wheezing, no crackles. Normal respiratory effort. No accessory muscle use.  Cardiovascular: Regular rate and rhythm, no murmurs / rubs / gallops. No extremity edema. 2+ pedal pulses. No carotid bruits.  Abdomen: no tenderness, no masses palpated. No hepatosplenomegaly. Bowel sounds positive.  Musculoskeletal: no clubbing / cyanosis. No joint deformity upper and lower extremities. Good ROM, no contractures. Normal muscle tone.  Skin: Large open wound on the right leg stump with purulent discharge Neurologic: CN 2-12 grossly intact. Sensation intact, DTR normal. Strength 5/5 in all 4.  Psychiatric: Normal judgment and insight. Alert and oriented x 3. Normal mood.       Labs on Admission: I have personally reviewed following labs and imaging studies  CBC: Recent Labs  Lab 06/22/19 1037  WBC 10.6*  NEUTROABS 7.6  HGB 9.4*  HCT 32.2*  MCV 89.2  PLT [id number]   Basic Metabolic Panel: Recent Labs  Lab 06/22/19 1037  NA 138  K 4.3  CL 101  CO2 27  GLUCOSE 147*  BUN 26*  CREATININE 1.90*  CALCIUM 8.9   GFR: Estimated Creatinine Clearance: 47.4 mL/min (A) (by C-G formula based on SCr of 1.9 mg/dL (H)). Liver Function Tests: Recent Labs  Lab 06/22/19 1037  AST 10*  ALT 11  ALKPHOS 90  BILITOT 0.4  PROT 7.2  ALBUMIN 2.6*   No results for input(s): LIPASE, AMYLASE in the last 168 hours. No results for input(s): AMMONIA in the last 168 hours. Coagulation Profile: Recent Labs  Lab 06/22/19 1037  INR 1.0   Cardiac Enzymes: No results for input(s): CKTOTAL, CKMB, CKMBINDEX, TROPONINI in the last 168 hours. BNP (last 3 results) No results for input(s): PROBNP in the last 8760 hours. HbA1C: No results for input(s): HGBA1C in the last 72 hours. CBG: No results for input(s): GLUCAP in the last 168 hours. Lipid Profile: No results for input(s): CHOL, HDL, LDLCALC, TRIG,  CHOLHDL, LDLDIRECT in the last 72 hours. Thyroid Function Tests: No results for input(s): TSH, T4TOTAL, FREET4, T3FREE, THYROIDAB in the last 72 hours. Anemia Panel: No results for input(s): VITAMINB12, FOLATE, FERRITIN, TIBC, IRON, RETICCTPCT in the last 72 hours. Urine analysis:    Component Value Date/Time   COLORURINE YELLOW 05/22/2019 2154   APPEARANCEUR CLEAR 05/22/2019 2154   LABSPEC 1.018 05/22/2019 2154   PHURINE 5.0 05/22/2019 2154   GLUCOSEU NEGATIVE 05/22/2019 2154   HGBUR NEGATIVE 05/22/2019 2154   [place] NEGATIVE 05/22/2019 2154   [place] NEGATIVE 05/22/2019 2154   PROTEINUR 100 (A) 05/22/2019 2154   UROBILINOGEN 1.0 02/03/2014 0052   NITRITE NEGATIVE 05/22/2019 2154   LEUKOCYTESUR NEGATIVE 05/22/2019 2154    Radiological Exams on Admission: No results found.  EKG: Independently reviewed.  Assessment/Plan Active Problems:   * No active hospital problems. *  Right leg stump infection, multiple MRSA screen positive in the past including recently, will start vancomycin to cover MRSA, patient received Zosyn in the ED, agree with Pseudomonas coverage given the patient has diabetes, Zosyn to cefepime and consult pharmacy for redosing of vancomycin and cefepime.  [name] will evaluate patient today.  Also consult wound care.  AKI on CKD stage III, as above renal dosed vancomycin and cefepime.  Hold Lasix for today, restart once kidney function stabilized.  Diabetes, start sliding scale.  Hypertension, continue metoprolol and Norvasc.  Chronic diastolic CHF, clinically compensated, hold Lasix for his kidney function.  Anemia of chronic disease, H&H stable.   DVT prophylaxis: Heparin subcu Code Status: Full code Family Communication: None at bedside  disposition Plan: Home Consults called: [name] Admission status: Med surg   Lequita Halt MD [facility] Pager [phone]  If 7PM-7AM, please contact night-coverage [email or web address] Password  [email or web address]  06/22/2019, 4:59 PM

## 2019-06-22 NOTE — ED Notes (Signed)
Pt  continuously calling out wanting pain medicine. This RN and Optician, dispensing provided a Biomedical scientist for better comfort.

## 2019-06-22 NOTE — Progress Notes (Addendum)
Pharmacy Antibiotic Note  Gregory [name] is a 59 y.o. male admitted on 06/22/2019 with a wound infection at a right-side AKA site infection. Pharmacy has been consulted for Vancomycin dosing.  Significant Labs:  WBC elevated at 10.6 and lactic acid 0.9  Plan: Give Vancomycin load 2 gm x1 dose; then start Vancomycin 750 mg q24hr   - Goal AUC 400-550.   - Expected AUC: 488.6   - SCr used: 1.90  Monitor clinical improvement and renal function; obtain vancomycin levels as needed  Height: 5\' 8"  (172.7 cm) Weight: 220 lb (99.8 kg) IBW/kg (Calculated) : 68.4  Temp (24hrs), Avg:98.3 F (36.8 C), Min:98 F (36.7 C), Max:98.6 F (37 C)  Recent Labs  Lab 06/22/19 1037  WBC 10.6*  CREATININE 1.90*  LATICACIDVEN 0.9    Estimated Creatinine Clearance: 47.4 mL/min (A) (by C-G formula based on SCr of 1.9 mg/dL (H)).    Allergies  Allergen Reactions  . Latex Other (See Comments)    Listed on MAR- pt sts he is not allergic.    Antimicrobials this admission: 1/4 Vancomycin >>  Dose adjustments this admission: none  Microbiology results: 1/4  Wound Cx:  1/4 BCx:    Thank you for allowing pharmacy to be a part of this patient's care.  Acey Lav, PharmD  PGY1 Acute Care Pharmacy Resident [phone]  06/22/2019 5:44 PM

## 2019-06-22 NOTE — Plan of Care (Signed)
?  Problem: Elimination: ?Goal: Will not experience complications related to urinary retention ?Outcome: Progressing ?  ?

## 2019-06-22 NOTE — ED Triage Notes (Signed)
PTAR with patient from [place] with recent right AKA that he has not pulled the staples out, the wound has dehisced and is draining copious amounts of fluid. Afebrile cbg 492 received Humolog PTA. A/O x4 in wheelchair.

## 2019-06-22 NOTE — ED Provider Notes (Addendum)
[facility] EMERGENCY DEPARTMENT Provider Note   CSN: [id number] Arrival date & time: 06/22/19  [id number]     History Chief Complaint  Patient presents with  . Wound Infection    Gregory [name] is a 59 y.o. 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  Patient was prescribed oral Augmentin and doxycycline x1 week with scheduled outpatient follow-up.  Patient complains of swollen leg and inadequate pain control.  Patient states that his significant pain requires him to continuously rub his affected thigh.  On 06/18/2019 at F/U patient was informed that he may take oxycodone 20 mg 4 times daily as well as repeat doxycycline 100 mg.  He denies any fevers, chills, fatigue, diminished appetite, or other symptoms.  HPI     Past Medical History:  Diagnosis Date  . Acquired absence of left great toe ([place])   . Acquired absence of right leg below knee ([place])   . Acute osteomyelitis of calcaneum, right ([place]) 10/02/2016  . Acute respiratory failure ([place])   . Amputation stump infection ([place]) 08/12/2018  . Anemia   . Basal cell carcinoma, eyelid   . Cancer ([place])   . Candidiasis 08/12/2018  . CHF (congestive heart failure) (HCC)    chronic diastolic   . Chronic back pain   . Chronic kidney disease    stage 3   . Chronic multifocal osteomyelitis (HCC)    of ankle and foot   . Chronic pain syndrome   . COPD (chronic obstructive pulmonary disease) ([place])   . DDD (degenerative disc disease), lumbar   . Depression    major depressive disorder   . Diabetes mellitus without complication ([place])    type 2   . Diabetic ulcer of toe of left foot ([place]) 10/02/2016  . Difficulty in walking, not elsewhere classified   . 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 Foot drop, bilateral   . Foot drop, right 12/27/2015  . GERD (gastroesophageal reflux disease)   . Herpesviral vesicular  dermatitis   . Hyperlipidemia   . Hyperlipidemia   . Hypertension   . Insomnia   . Malignant melanoma of other parts of face ([place])   . MRSA bacteremia   . Muscle weakness (generalized)   . Nasal congestion   . Necrosis of toe ([place]) 07/08/2018  . Neuromuscular dysfunction of bladder   . Osteomyelitis ([place])   . Osteomyelitis ([place])   . Osteomyelitis ([place])    right BKA  . Other idiopathic peripheral autonomic neuropathy   . Retention of urine, unspecified   . Squamous cell carcinoma of skin   . Unsteadiness on feet   . Urinary retention   . Urinary retention   . Urinary tract infection   . Wears dentures   . Wears glasses     Patient Active Problem List   Diagnosis Date Noted  . CKD (chronic kidney disease), stage III 05/23/2019  . Tobacco use 05/23/2019  . Sepsis ([place]) 05/23/2019  . Acquired absence of right lower extremity above knee ([place]) 12/05/2018  . Amputation stump infection ([place])   . Lymphedema of left leg   . Diabetic polyneuropathy associated with type 2 diabetes mellitus ([place])   . Below-knee amputation of right lower extremity ([place]) 10/22/2018  . Dehiscence of amputation stump ([place])   . 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[place]) 08/12/2018  . Candidiasis 08/12/2018  . Necrosis of toe ([place])  07/08/2018  . Amputated toe of left foot ([place]) 06/18/2018  . Streptococcal infection   . Pressure injury of skin 06/06/2018  . Cutaneous abscess of left foot   . Moderate protein-calorie malnutrition ([place])   . Cellulitis of left leg   . Septic arthritis ([place]) 06/03/2018  . Idiopathic chronic venous hypertension of left lower extremity with ulcer and inflammation ([place]) 11/14/2017  . Great toe amputation status, left 11/14/2017  . Acquired absence of right leg below knee ([place]) 06/20/2017  . Osteomyelitis ([place]) 03/25/2017  . Skin cancer 03/25/2017  . Depression, recurrent ([place]) 11/05/2016  . Diabetic ulcer of toe of left foot ([place]) 10/02/2016  . 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    . Chronic pain 09/27/2016  . Dyslipidemia associated with type 2 diabetes mellitus ([place]) 06/06/2016  . GERD without esophagitis 05/11/2016  . Hypertensive heart disease with congestive heart failure ([place]) 12/15/2015  . Spinal stenosis in cervical region 12/15/2015  . Type II diabetes mellitus with neurological manifestations ([place]) 12/15/2015  . Chronic diastolic CHF (congestive heart failure) ([place])   . Pulmonary hypertension ([place])   . Urinary retention   . Diskitis   . Foot drop, bilateral   . Sepsis with acute renal failure without septic shock ([place])   . MRSA bacteremia 10/03/2015  . COPD (chronic obstructive pulmonary disease) ([place]) 10/03/2015  . Acute osteomyelitis of left foot ([place]) 10/03/2015    Past Surgical History:  Procedure Laterality Date  . AMPUTATION Left 03/29/2017   Procedure: LEFT GREAT TOE AMPUTATION AT METATARSOPHALANGEAL JOINT;  Surgeon: [name], MD;  Location: [place];  Service: Orthopedics;  Laterality: Left;  . AMPUTATION Right 03/29/2017   Procedure: RIGHT BELOW KNEE AMPUTATION;  Surgeon: [name], MD;  Location: [place];  Service: Orthopedics;  Laterality: Right;  . AMPUTATION Left 06/06/2018   Procedure: LEFT 2ND TOE AMPUTATION;  Surgeon: [name], MD;  Location: [place];  Service: Orthopedics;  Laterality: Left;  . AMPUTATION Right 11/19/2018   Procedure: AMPUTATION ABOVE KNEE;  Surgeon: [name], MD;  Location: [place];  Service: Orthopedics;  Laterality: Right;  . ANTERIOR CERVICAL CORPECTOMY N/A 11/25/2015   Procedure: ANTERIOR CERVICAL FIVE CORPECTOMY Cervical four - six fusion;  Surgeon: Consuella Lose, MD;  Location: [place] [facility];  Service: Neurosurgery;  Laterality: N/A;  ANTERIOR CERVICAL FIVE CORPECTOMY Cervical four - six fusion  . APPLICATION OF WOUND VAC Right 10/22/2018   Procedure: Application Of Wound Vac;  Surgeon: [name], MD;  Location: [place];  Service: Orthopedics;  Laterality: Right;  . CYSTOSCOPY WITH RETROGRADE  URETHROGRAM N/A 04/25/2018   Procedure: CYSTOSCOPY WITH RETROGRADE URETHROGRAM/ BALLOON DILATION;  Surgeon: Kathie Rhodes, MD;  Location: [facility];  Service: Urology;  Laterality: N/A;  . MULTIPLE TOOTH EXTRACTIONS    . POSTERIOR CERVICAL FUSION/FORAMINOTOMY N/A 11/29/2015   Procedure: Cervical Three-Cervical Seven Posterior Cervical Laminectomy with Fusion;  Surgeon: Consuella Lose, MD;  Location: [place] [facility];  Service: Neurosurgery;  Laterality: N/A;  Cervical Three-Cervical Seven Posterior Cervical Laminectomy with Fusion  . STUMP REVISION Right 10/22/2018   Procedure: REVISION RIGHT BELOW KNEE AMPUTATION;  Surgeon: [name], MD;  Location: [place];  Service: Orthopedics;  Laterality: Right;  . STUMP REVISION Right 12/05/2018   Procedure: Revision Right Above Knee Amputation;  Surgeon: [name], MD;  Location: [place];  Service: Orthopedics;  Laterality: Right;  . STUMP REVISION Right 05/29/2019   Procedure: REVISION RIGHT ABOVE KNEE AMPUTATION;  Surgeon: [name], MD;  Location: [place];  Service: Orthopedics;  Laterality: Right;       Family History  Problem Relation Age of Onset  . Cancer Other   . Diabetes Other     Social History   Tobacco Use  . Smoking status: Current Every Day Smoker    Packs/day: 0.50    Types: Cigarettes  . Smokeless tobacco: Never Used  . Tobacco comment: 11/19/18  Substance Use Topics  . Alcohol use: No  . Drug use: No    Home Medications Prior to Admission medications   Medication Sig Start Date End Date Taking? Authorizing Provider  acetaminophen (TYLENOL) 325 MG tablet Take 650 mg by mouth every 4 (four) hours as needed (general discomfort).     [provider]  albuterol (PROVENTIL HFA;VENTOLIN HFA) 108 (90 Base) MCG/ACT inhaler Inhale 2 puffs into the lungs every 6 (six) hours as needed for wheezing or shortness of breath.    [provider]  amLODipine (NORVASC) 10 MG tablet Take 10 mg by mouth daily. for HTN     [provider]  atorvastatin (LIPITOR) 20 MG tablet Take 20 mg by mouth at bedtime.    [provider]  bisacodyl (DULCOLAX) 10 MG suppository Place 1 suppository (10 mg total) rectally daily as needed for moderate constipation. 12/02/15   [name], MD  buPROPion (WELLBUTRIN XL) 300 MG 24 hr tablet Take 300 mg by mouth at bedtime.    [provider]  calcium carbonate (TUMS - DOSED IN MG ELEMENTAL CALCIUM) 500 MG chewable tablet Chew 2 tablets by mouth every 8 (eight) hours as needed for indigestion or heartburn.    [provider]  cetirizine (ZYRTEC) 10 MG tablet Take 10 mg by mouth daily.    [provider]  CETYLPYRIDINIUM CHLORIDE MT Use as directed 15 mLs in the mouth or throat every 4 (four) hours as needed (dry mouth).    [provider]  Cholecalciferol (VITAMIN D-3) 5000 units TABS Take 5,000 Units by mouth daily.    [provider]  collagenase (SANTYL) ointment Apply 1 application topically daily. Apply to the affected area daily plus dry dressing 01/20/19   [name], MD  docusate sodium (COLACE) 100 MG capsule Take 1 capsule (100 mg total) by mouth 2 (two) times daily. 05/31/19   Amin, Jeanella Flattery, MD  doxycycline (VIBRA-TABS) 100 MG tablet Take 1 tablet (100 mg total) by mouth 2 (two) times daily. 06/18/19   [name], MD  eszopiclone (LUNESTA) 1 MG TABS tablet Take 1 mg by mouth at bedtime. Take immediately before bedtime    [provider]  ferrous sulfate 325 (65 FE) MG tablet Take 1 tablet (325 mg total) by mouth daily with breakfast. 11/23/18   Matilde Haymaker, MD  finasteride (PROSCAR) 5 MG tablet Take 5 mg by mouth daily.    [provider]  furosemide (LASIX) 40 MG tablet Take 40 mg by mouth daily.    [provider]  glipiZIDE (GLUCOTROL) 10 MG tablet Take 10 mg by mouth daily before breakfast.    [provider]  glucagon (GLUCAGON EMERGENCY) 1 MG injection Inject 1 mg into  the muscle once as needed (for CBG <70).     [provider]  insulin lispro (HUMALOG KWIKPEN) 100 UNIT/ML KwikPen Inject 0-10 Units into the skin See admin instructions. Inject 0-10 units per sliding scale 3 times daily before meals; CBG <70 = notify MD, 0-150 = 0 units, 151-200 = 2  units, 201-250 = 4 units, 251-300 = 6 units, 301-350 = 8 units, 351-400 = 10 units, >400 = notify MD    [provider]  lidocaine (LIDODERM) 5 % Place 1 patch onto the skin daily. Apply to Right stump. Remove & Discard patch within 12 hours or as directed by MD    [provider]  magnesium oxide (MAG-OX) 400 MG tablet Take 400 mg by mouth 2 (two) times daily.    [provider]  Menthol, Topical Analgesic, (BIOFREEZE) 4 % GEL Apply 1 application topically every 4 (four) hours as needed (pain). Apply to right stump    [provider]  metoprolol tartrate (LOPRESSOR) 50 MG tablet Take 50 mg by mouth daily.     [provider]  mirabegron ER (MYRBETRIQ) 50 MG TB24 tablet Take 50 mg by mouth daily.    [provider]  mometasone (NASONEX) 50 MCG/ACT nasal spray Place 2 sprays into the nose at bedtime.     [provider]  nicotine (NICODERM CQ - DOSED IN MG/24 HOURS) 14 mg/24hr patch Place 14 mg onto the skin daily.    [provider]  omeprazole (PRILOSEC) 40 MG capsule Take 40 mg by mouth daily before breakfast.     [provider]  ondansetron (ZOFRAN) 4 MG tablet Take 4 mg by mouth every 6 (six) hours as needed for nausea or vomiting.    [provider]  oxybutynin (DITROPAN XL) 15 MG 24 hr tablet Take 15 mg by mouth at bedtime.    [provider]  Oxycodone HCl 20 MG TABS Take 1 tablet (20 mg total) by mouth every 6 (six) hours as needed (pain). 05/31/19   Amin, Jeanella Flattery, MD  oxyCODONE-acetaminophen (PERCOCET) 10-325 MG tablet Take 1 tablet by mouth every 6 (six) hours as needed for pain. 06/18/19   [name], MD  PARoxetine (PAXIL) 20 MG tablet Take 10 mg by mouth at bedtime.     [provider]  polyethylene glycol (MIRALAX / GLYCOLAX) packet Take 17 g by mouth daily as needed for mild constipation. 06/09/18   Shelly Coss, MD  pregabalin (LYRICA) 150 MG capsule Take 1 capsule (150 mg total) by mouth 2 (two) times daily. 10/24/18   Rayburn, Neta Mends, [name]  senna-docusate (SENOKOT-S) 8.6-50 MG tablet Take 2 tablets by mouth at bedtime.    [provider]  tamsulosin (FLOMAX) 0.4 MG CAPS capsule Take 0.8 mg by mouth every evening.  11/26/13   [provider]  terbinafine (LAMISIL) 250 MG tablet Take 250 mg by mouth daily.    [provider]  Tiotropium Bromide Monohydrate (SPIRIVA RESPIMAT) 1.25 MCG/ACT AERS Inhale 2 puffs into the lungs daily.    [provider]  tiZANidine (ZANAFLEX) 4 MG tablet Take 4 mg by mouth every 8 (eight) hours as needed for muscle spasms.    [provider]  vitamin B-12 (CYANOCOBALAMIN) 500 MCG tablet Take 1,000 mcg by mouth daily.     [provider]    Allergies    Latex  Review of Systems   Review of Systems  Constitutional: Negative for chills and fever.    Physical Exam Updated Vital Signs BP 138/69 (BP Location: Left Arm)   Pulse 77   Temp 98.6 F (37 C) (Oral)   Resp 19   Ht 5\' 8"  (1.727 m)   Wt 99.8 kg   SpO2 93%   BMI 33.45 kg/m   Physical Exam Vitals and  nursing note reviewed. Exam conducted with a chaperone present.  Constitutional:      Appearance: Normal appearance.  HENT:     Head: Normocephalic and atraumatic.  Eyes:     General: No scleral icterus.    Conjunctiva/sclera: Conjunctivae normal.  Pulmonary:     Effort: Pulmonary effort is normal.  Musculoskeletal:     Comments: Right leg: S/P AKA. Open surgical incision site. Profuse, purulent drainage. Foul odor. Surrounding erythema and warmth. Mildly indurated. Mildly TTP.  Skin:    General: Skin is  dry.  Neurological:     Mental Status: He is alert.     GCS: GCS eye subscore is 4. GCS verbal subscore is 5. GCS motor subscore is 6.  Psychiatric:        Mood and Affect: Mood normal.        Behavior: Behavior normal.        Thought Content: Thought content normal.          ED Results / Procedures / Treatments   Labs (all labs ordered are listed, but only abnormal results are displayed) Labs Reviewed  COMPREHENSIVE METABOLIC PANEL - Abnormal; Notable for the following components:      Result Value   Glucose, Bld 147 (*)    BUN 26 (*)    Creatinine, Ser 1.90 (*)    Albumin 2.6 (*)    AST 10 (*)    GFR calc non Af Amer 37 (*)    GFR calc Af Amer 43 (*)    All other components within normal limits  CBC WITH DIFFERENTIAL/PLATELET - Abnormal; Notable for the following components:   WBC 10.6 (*)    RBC 3.61 (*)    Hemoglobin 9.4 (*)    HCT 32.2 (*)    MCHC 29.2 (*)    RDW 17.2 (*)    All other components within normal limits  CULTURE, BLOOD (SINGLE)  AEROBIC CULTURE (SUPERFICIAL SPECIMEN)  LACTIC ACID, PLASMA  PROTIME-INR  URINALYSIS, ROUTINE W REFLEX MICROSCOPIC    EKG None  Radiology No results found.  Procedures Procedures (including critical care time)  Medications Ordered in ED Medications  piperacillin-tazobactam (ZOSYN) IVPB 3.375 g (has no administration in time range)  oxyCODONE-acetaminophen (PERCOCET/ROXICET) 5-325 MG per tablet 1 tablet (1 tablet Oral Given 06/22/19 1519)  morphine 4 MG/ML injection 4 mg (4 mg Intravenous Given 06/22/19 1630)  ondansetron (ZOFRAN) injection 4 mg (4 mg Intravenous Given 06/22/19 1631)    ED Course  I have reviewed the triage vital signs and the nursing notes.  Pertinent labs & imaging results that were available during my care of the patient were reviewed by me and considered in my medical decision making (see chart for details).  Clinical Course as of Jun 21 1699  Mon Jun 22, 2019  1659 Spoke with Dr. Roosevelt Locks who  will evaluate and admit patient.    [GG]    Clinical Course User Index [GG] Corena Herter, [name]   MDM Rules/Calculators/A&P                      Silvestre Gunner, [name] from orthopedics was consulted and personally evaluated patient.  He spoke with [name] and given patient's failed outpatient antibiotic therapy, plan is to have patient admitted to medicine for IV antibiotics and continued pain management.  Started patient on Zosyn IV.  Morphine 4 mg IV and Zofran 4 mg IV provided.  Patient is hemodynamically stable and in no acute distress.  Will obtain specimen culture.    Will consult medicine for admission for IV antibiotics, per request of [name]. Spoke with Dr. Roosevelt Locks who will evaluate and admit patient.   Final Clinical Impression(s) / ED Diagnoses Final diagnoses:  Wound infection    Rx / DC Orders ED Discharge Orders    None       Corena Herter, [name] 06/22/19 1701    Sherwood Gambler, MD 06/23/19 0903    Corena Herter, [name] 07/30/19 1523    Sherwood Gambler, MD 07/31/19 [phone]

## 2019-06-23 ENCOUNTER — Encounter (HOSPITAL_COMMUNITY): Payer: Self-pay | Admitting: Internal Medicine

## 2019-06-23 ENCOUNTER — Other Ambulatory Visit: Payer: Self-pay | Admitting: Physician Assistant

## 2019-06-23 ENCOUNTER — Other Ambulatory Visit: Payer: Self-pay

## 2019-06-23 DIAGNOSIS — F329 Major depressive disorder, single episode, unspecified: Secondary | ICD-10-CM

## 2019-06-23 DIAGNOSIS — N183 Chronic kidney disease, stage 3 unspecified: Secondary | ICD-10-CM

## 2019-06-23 DIAGNOSIS — I5032 Chronic diastolic (congestive) heart failure: Secondary | ICD-10-CM

## 2019-06-23 DIAGNOSIS — I10 Essential (primary) hypertension: Secondary | ICD-10-CM

## 2019-06-23 DIAGNOSIS — D638 Anemia in other chronic diseases classified elsewhere: Secondary | ICD-10-CM

## 2019-06-23 LAB — SARS CORONAVIRUS 2 (TAT 6-24 HRS): SARS Coronavirus 2: NEGATIVE

## 2019-06-23 LAB — GLUCOSE, CAPILLARY
Glucose-Capillary: 184 mg/dL — ABNORMAL HIGH (ref 70–99)
Glucose-Capillary: 231 mg/dL — ABNORMAL HIGH (ref 70–99)
Glucose-Capillary: 179 mg/dL — ABNORMAL HIGH (ref 70–99)
Glucose-Capillary: 224 mg/dL — ABNORMAL HIGH (ref 70–99)

## 2019-06-23 LAB — HIV ANTIBODY (ROUTINE TESTING W REFLEX): HIV Screen 4th Generation wRfx: NONREACTIVE

## 2019-06-23 MED ORDER — FUROSEMIDE 40 MG PO TABS
40.0000 mg | ORAL_TABLET | Freq: Every day | ORAL | Status: DC
  Administered 2019-06-23 – 2019-06-27 (×5): 40 mg via ORAL
  Filled 2019-06-23 (×5): qty 1

## 2019-06-23 MED ORDER — CHLORHEXIDINE GLUCONATE 4 % EX LIQD
60.0000 mL | Freq: Once | CUTANEOUS | Status: AC
  Administered 2019-06-24: 4 via TOPICAL

## 2019-06-23 MED ORDER — VANCOMYCIN HCL IN DEXTROSE 1-5 GM/200ML-% IV SOLN
1000.0000 mg | INTRAVENOUS | Status: DC
  Administered 2019-06-23: 1000 mg via INTRAVENOUS
  Filled 2019-06-23 (×2): qty 200

## 2019-06-23 MED ORDER — OXYCODONE-ACETAMINOPHEN 5-325 MG PO TABS
1.0000 | ORAL_TABLET | Freq: Four times a day (QID) | ORAL | Status: DC | PRN
  Administered 2019-06-24 – 2019-06-27 (×3): 1 via ORAL
  Filled 2019-06-23 (×3): qty 1

## 2019-06-23 MED ORDER — OXYCODONE HCL 5 MG PO TABS
20.0000 mg | ORAL_TABLET | Freq: Four times a day (QID) | ORAL | Status: DC | PRN
  Filled 2019-06-23: qty 4

## 2019-06-23 MED ORDER — OXYCODONE HCL 5 MG PO TABS
20.0000 mg | ORAL_TABLET | Freq: Four times a day (QID) | ORAL | Status: DC | PRN
  Administered 2019-06-23 – 2019-06-24 (×4): 20 mg via ORAL
  Filled 2019-06-23 (×5): qty 4

## 2019-06-23 MED ORDER — OXYCODONE HCL 5 MG PO TABS: 5.0000 mg | ORAL_TABLET | Freq: Four times a day (QID) | ORAL | Status: DC | PRN

## 2019-06-23 MED ORDER — OXYCODONE-ACETAMINOPHEN 5-325 MG PO TABS
1.0000 | ORAL_TABLET | Freq: Four times a day (QID) | ORAL | Status: DC | PRN
  Filled 2019-06-23: qty 1

## 2019-06-23 NOTE — Consult Note (Signed)
ORTHOPAEDIC CONSULTATION  REQUESTING PHYSICIAN: Cristal Ford, DO  Chief Complaint: Dehiscence cellulitis and pain right above-the-knee amputation.  HPI: Gregory [name] is a 59 y.o. male who presents with dehiscence and infection of the right above-knee amputation.  After the last surgery when the patient returned to the room after surgery he removed the wound VAC dressing and manipulated the wound.  In follow-up visits in the office patient continued to manipulate the wound.  The wound has progressively broken down and patient has a deep infection with exposed bone.  Past Medical History:  Diagnosis Date  . Acquired absence of left great toe ([place])   . Acquired absence of right leg below knee ([place])   . Acute osteomyelitis of calcaneum, right ([place]) 10/02/2016  . Acute respiratory failure ([place])   . Amputation stump infection ([place]) 08/12/2018  . Anemia   . Basal cell carcinoma, eyelid   . Cancer ([place])   . Candidiasis 08/12/2018  . CHF (congestive heart failure) (HCC)    chronic diastolic   . Chronic back pain   . Chronic kidney disease    stage 3   . Chronic multifocal osteomyelitis (HCC)    of ankle and foot   . Chronic pain syndrome   . COPD (chronic obstructive pulmonary disease) ([place])   . DDD (degenerative disc disease), lumbar   . Depression    major depressive disorder   . Diabetes mellitus without complication ([place])    type 2   . Diabetic ulcer of toe of left foot ([place]) 10/02/2016  . Difficulty in walking, not elsewhere classified   . 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 Foot drop, bilateral   . Foot drop, right 12/27/2015  . GERD (gastroesophageal reflux disease)   . Herpesviral vesicular dermatitis   . Hyperlipidemia   . Hyperlipidemia   . Hypertension   . Insomnia   . Malignant melanoma of other parts of face ([place])   . MRSA bacteremia   . Muscle weakness (generalized)   . Nasal congestion   . Necrosis of toe ([place]) 07/08/2018  . Neuromuscular dysfunction of bladder     . Osteomyelitis ([place])   . Osteomyelitis ([place])   . Osteomyelitis ([place])    right BKA  . Other idiopathic peripheral autonomic neuropathy   . Retention of urine, unspecified   . Squamous cell carcinoma of skin   . Unsteadiness on feet   . Urinary retention   . Urinary retention   . Urinary tract infection   . Wears dentures   . Wears glasses    Past Surgical History:  Procedure Laterality Date  . AMPUTATION Left 03/29/2017   Procedure: LEFT GREAT TOE AMPUTATION AT METATARSOPHALANGEAL JOINT;  Surgeon: [name], MD;  Location: [facility];  Service: Orthopedics;  Laterality: Left;  . AMPUTATION Right 03/29/2017   Procedure: RIGHT BELOW KNEE AMPUTATION;  Surgeon: [name], MD;  Location: [facility];  Service: Orthopedics;  Laterality: Right;  . AMPUTATION Left 06/06/2018   Procedure: LEFT 2ND TOE AMPUTATION;  Surgeon: [name], MD;  Location: [facility];  Service: Orthopedics;  Laterality: Left;  . AMPUTATION Right 11/19/2018   Procedure: AMPUTATION ABOVE KNEE;  Surgeon: [name], MD;  Location: [facility];  Service: Orthopedics;  Laterality: Right;  . ANTERIOR CERVICAL CORPECTOMY N/A 11/25/2015   Procedure: ANTERIOR CERVICAL FIVE CORPECTOMY Cervical four - six fusion;  Surgeon: Consuella Lose, MD;  Location: [facility];  Service: Neurosurgery;  Laterality: N/A;  ANTERIOR CERVICAL FIVE CORPECTOMY  Cervical four - six fusion  . APPLICATION OF WOUND VAC Right 10/22/2018   Procedure: Application Of Wound Vac;  Surgeon: [name], MD;  Location: [facility];  Service: Orthopedics;  Laterality: Right;  . CYSTOSCOPY WITH RETROGRADE URETHROGRAM N/A 04/25/2018   Procedure: CYSTOSCOPY WITH RETROGRADE URETHROGRAM/ BALLOON DILATION;  Surgeon: Kathie Rhodes, MD;  Location: [facility];  Service: Urology;  Laterality: N/A;  . MULTIPLE TOOTH EXTRACTIONS    . POSTERIOR CERVICAL FUSION/FORAMINOTOMY N/A 11/29/2015   Procedure: Cervical Three-Cervical Seven Posterior Cervical Laminectomy with Fusion;  Surgeon:  Consuella Lose, MD;  Location: [facility];  Service: Neurosurgery;  Laterality: N/A;  Cervical Three-Cervical Seven Posterior Cervical Laminectomy with Fusion  . STUMP REVISION Right 10/22/2018   Procedure: REVISION RIGHT BELOW KNEE AMPUTATION;  Surgeon: [name], MD;  Location: [facility];  Service: Orthopedics;  Laterality: Right;  . STUMP REVISION Right 12/05/2018   Procedure: Revision Right Above Knee Amputation;  Surgeon: [name], MD;  Location: [facility];  Service: Orthopedics;  Laterality: Right;  . STUMP REVISION Right 05/29/2019   Procedure: REVISION RIGHT ABOVE KNEE AMPUTATION;  Surgeon: [name], MD;  Location: [facility];  Service: Orthopedics;  Laterality: Right;   Social History   Socioeconomic History  . Marital status: Single    Spouse name: Not on file  . Number of children: Not on file  . Years of education: Not on file  . Highest education level: Not on file  Occupational History  . Not on file  Tobacco Use  . Smoking status: Current Every Day Smoker    Packs/day: 0.50    Types: Cigarettes  . Smokeless tobacco: Never Used  . Tobacco comment: 11/19/18  Substance and Sexual Activity  . Alcohol use: No  . Drug use: No  . Sexual activity: Not on file  Other Topics Concern  . Not on file  Social History Narrative  . Not on file   Social Determinants of Health   Financial Resource Strain:   . Difficulty of Paying Living Expenses: Not on file  Food Insecurity:   . Worried About Charity fundraiser in the Last Year: Not on file  . Ran Out of Food in the Last Year: Not on file  Transportation Needs:   . Lack of Transportation (Medical): Not on file  . Lack of Transportation (Non-Medical): Not on file  Physical Activity:   . Days of Exercise per Week: Not on file  . Minutes of Exercise per Session: Not on file  Stress:   . Feeling of Stress : Not on file  Social Connections:   . Frequency of Communication with Friends and Family: Not on file  .  Frequency of Social Gatherings with Friends and Family: Not on file  . Attends Religious Services: Not on file  . Active Member of Clubs or Organizations: Not on file  . Attends Archivist Meetings: Not on file  . Marital Status: Not on file   Family History  Problem Relation Age of Onset  . Cancer Other   . Diabetes Other    - negative except otherwise stated in the family history section Allergies  Allergen Reactions  . Latex Other (See Comments)    Listed on MAR- pt sts he is not allergic.   Prior to Admission medications   Medication Sig Start Date End Date Taking? Authorizing Provider  amLODipine (NORVASC) 10 MG tablet Take 10 mg by mouth daily. for HTN   Yes  [provider]  atorvastatin (LIPITOR) 20 MG tablet Take 20 mg by mouth at bedtime.   Yes [provider]  furosemide (LASIX) 40 MG tablet Take 40 mg by mouth daily.   Yes [provider]  metoprolol tartrate (LOPRESSOR) 50 MG tablet Take 50 mg by mouth daily.    Yes [provider]  acetaminophen (TYLENOL) 325 MG tablet Take 650 mg by mouth every 4 (four) hours as needed (general discomfort).     [provider]  albuterol (PROVENTIL HFA;VENTOLIN HFA) 108 (90 Base) MCG/ACT inhaler Inhale 2 puffs into the lungs every 6 (six) hours as needed for wheezing or shortness of breath.    [provider]  bisacodyl (DULCOLAX) 10 MG suppository Place 1 suppository (10 mg total) rectally daily as needed for moderate constipation. 12/02/15   [name], MD  buPROPion (WELLBUTRIN XL) 300 MG 24 hr tablet Take 300 mg by mouth at bedtime.    [provider]  calcium carbonate (TUMS - DOSED IN MG ELEMENTAL CALCIUM) 500 MG chewable tablet Chew 2 tablets by mouth every 8 (eight) hours as needed for indigestion or heartburn.    [provider]  cetirizine (ZYRTEC) 10 MG tablet Take 10 mg by mouth daily.    [provider]  CETYLPYRIDINIUM CHLORIDE MT Use as  directed 15 mLs in the mouth or throat every 4 (four) hours as needed (dry mouth).    [provider]  Cholecalciferol (VITAMIN D-3) 5000 units TABS Take 5,000 Units by mouth daily.    [provider]  collagenase (SANTYL) ointment Apply 1 application topically daily. Apply to the affected area daily plus dry dressing 01/20/19   [name], MD  docusate sodium (COLACE) 100 MG capsule Take 1 capsule (100 mg total) by mouth 2 (two) times daily. 05/31/19   Amin, Jeanella Flattery, MD  doxycycline (VIBRA-TABS) 100 MG tablet Take 1 tablet (100 mg total) by mouth 2 (two) times daily. 06/18/19   [name], MD  eszopiclone (LUNESTA) 1 MG TABS tablet Take 1 mg by mouth at bedtime. Take immediately before bedtime    [provider]  ferrous sulfate 325 (65 FE) MG tablet Take 1 tablet (325 mg total) by mouth daily with breakfast. 11/23/18   Matilde Haymaker, MD  finasteride (PROSCAR) 5 MG tablet Take 5 mg by mouth daily.    [provider]  glipiZIDE (GLUCOTROL) 10 MG tablet Take 10 mg by mouth daily before breakfast.    [provider]  glucagon (GLUCAGON EMERGENCY) 1 MG injection Inject 1 mg into the muscle once as needed (for CBG <70).     [provider]  insulin lispro (HUMALOG KWIKPEN) 100 UNIT/ML KwikPen Inject 0-10 Units into the skin See admin instructions. Inject 0-10 units per sliding scale 3 times daily before meals; CBG <70 = notify MD, 0-150 = 0 units, 151-200 = 2 units, 201-250 = 4 units, 251-300 = 6 units, 301-350 = 8 units, 351-400 = 10 units, >400 = notify MD    [provider]  lidocaine (LIDODERM) 5 % Place 1 patch onto the skin daily. Apply to Right stump. Remove & Discard patch within 12 hours or as directed by MD    [provider]  magnesium oxide (MAG-OX) 400 MG tablet Take 400 mg by mouth 2 (two) times daily.    [provider]  Menthol, Topical Analgesic, (BIOFREEZE) 4 % GEL Apply 1 application topically every 4  (four) hours as needed (pain).  Apply to right stump    [provider]  mirabegron ER (MYRBETRIQ) 50 MG TB24 tablet Take 50 mg by mouth daily.    [provider]  mometasone (NASONEX) 50 MCG/ACT nasal spray Place 2 sprays into the nose at bedtime.     [provider]  nicotine (NICODERM CQ - DOSED IN MG/24 HOURS) 14 mg/24hr patch Place 14 mg onto the skin daily.    [provider]  omeprazole (PRILOSEC) 40 MG capsule Take 40 mg by mouth daily before breakfast.     [provider]  ondansetron (ZOFRAN) 4 MG tablet Take 4 mg by mouth every 6 (six) hours as needed for nausea or vomiting.    [provider]  oxybutynin (DITROPAN XL) 15 MG 24 hr tablet Take 15 mg by mouth at bedtime.    [provider]  Oxycodone HCl 20 MG TABS Take 1 tablet (20 mg total) by mouth every 6 (six) hours as needed (pain). 05/31/19   Amin, Jeanella Flattery, MD  oxyCODONE-acetaminophen (PERCOCET) 10-325 MG tablet Take 1 tablet by mouth every 6 (six) hours as needed for pain. 06/18/19   [name], MD  PARoxetine (PAXIL) 20 MG tablet Take 10 mg by mouth at bedtime.     [provider]  polyethylene glycol (MIRALAX / GLYCOLAX) packet Take 17 g by mouth daily as needed for mild constipation. 06/09/18   Shelly Coss, MD  pregabalin (LYRICA) 150 MG capsule Take 1 capsule (150 mg total) by mouth 2 (two) times daily. 10/24/18   Rayburn, Neta Mends, [name]  senna-docusate (SENOKOT-S) 8.6-50 MG tablet Take 2 tablets by mouth at bedtime.    [provider]  tamsulosin (FLOMAX) 0.4 MG CAPS capsule Take 0.8 mg by mouth every evening.  11/26/13   [provider]  terbinafine (LAMISIL) 250 MG tablet Take 250 mg by mouth daily.    [provider]  Tiotropium Bromide Monohydrate (SPIRIVA RESPIMAT) 1.25 MCG/ACT AERS Inhale 2 puffs into the lungs daily.    [provider]  tiZANidine (ZANAFLEX) 4 MG tablet Take 4 mg by mouth every 8  (eight) hours as needed for muscle spasms.    [provider]  vitamin B-12 (CYANOCOBALAMIN) 500 MCG tablet Take 1,000 mcg by mouth daily.     [provider]   No results found. - pertinent xrays, CT, MRI studies were reviewed and independently interpreted  Positive ROS: All other systems have been reviewed and were otherwise negative with the exception of those mentioned in the HPI and as above.  Physical Exam: General: Alert, no acute distress Psychiatric: Patient is competent for consent with normal mood and affect Lymphatic: No axillary or cervical lymphadenopathy Cardiovascular: No pedal edema Respiratory: No cyanosis, no use of accessory musculature GI: No organomegaly, abdomen is soft and non-tender    Images:  @ENCIMAGES @  Labs:  Lab Results  Component Value Date   HGBA1C 8.6 (H) 05/23/2019   HGBA1C 7.7 (H) 11/17/2018   HGBA1C 8.0 (H) 06/04/2018   ESRSEDRATE 102 (H) 05/27/2019   ESRSEDRATE 120 (H) 03/25/2017   ESRSEDRATE 84 (H) 10/02/2016   CRP 10.2 (H) 05/27/2019   CRP 4.9 (H) 03/25/2017   CRP 30.6 (H) 10/02/2016   REPTSTATUS PENDING 06/22/2019   GRAMSTAIN  06/22/2019    FEW WBC PRESENT, PREDOMINANTLY PMN ABUNDANT GRAM POSITIVE COCCI MODERATE GRAM NEGATIVE RODS Performed at [facility], [address] [place], [place] [address]    CULT PENDING 06/22/2019   LABORGA  PROVIDENCIA [name] (A) 01/12/2017    Lab Results  Component Value Date   ALBUMIN 2.6 (L) 06/22/2019   ALBUMIN 3.3 (L) 05/22/2019   ALBUMIN 3.3 (L) 10/22/2018   PREALBUMIN 23.3 03/25/2017    Neurologic: Patient does not have protective sensation bilateral lower extremities.   MUSCULOSKELETAL:   Skin: Examination patient has dehiscence of the right above-the-knee wound there is cellulitis and the wound is open down to bone.  Patient has localized cellulitis but no ascending cellulitis his albumin is 2.6 hemoglobin A1c 8.6.  Assessment: Assessment dehiscence  right above-the-knee amputation.  Plan: Plan: Discussed with the patient his only option is for revision of the above-knee amputation of the right.  Discussed that we would not proceed with surgery unless patient would guarantee that he would not remove the dressing and would not manipulate the wound.  Patient states he understands agrees that he will be compliant.  Plan for surgery Wednesday or Friday depending on operating room availability.  Thank you for the consult and the opportunity to see Mr. Kevinn Tseng, [place] [phone] 9:48 AM

## 2019-06-23 NOTE — Anesthesia Preprocedure Evaluation (Addendum)
Anesthesia Evaluation  Patient identified by MRN, date of birth, ID band Patient awake    Reviewed: Allergy & Precautions, NPO status , Patient's Chart, lab work & pertinent test results  Airway Mallampati: III  TM Distance: >3 FB Neck ROM: Full    Dental  (+) Poor Dentition, Missing, Edentulous Upper   Pulmonary COPD, Current Smoker and Patient abstained from smoking.,    Pulmonary exam normal        Cardiovascular hypertension, +CHF  Normal cardiovascular exam  Echo 11/20/15: Study Conclusions - Left ventricle: The cavity size was normal. There was mild concentric hypertrophy. Systolic function was normal. Theestimated ejection fraction was in the range of 55% to 60%. Doppler parameters are consistent with abnormal left ventricular relaxation (grade 1 diastolic dysfunction). - Pericardium, extracardiac: A small pericardial effusion was identified anterior to the heart. There was no evidence of hemodynamic compromise.  Carotid US 04/11/12: Summary: - No significant extracranial carotid artery stenosis demonstrated. Right vertebral could not be visualized due to thickness of the neck and respiratory interference. Left vertebral artery flow is antegrade - Technically difficult due to thickness of the neck, high bifurcations, and respiratory interference.   Neuro/Psych PSYCHIATRIC DISORDERS Depression negative neurological ROS     GI/Hepatic Neg liver ROS, GERD  ,  Endo/Other  diabetes, Type 2  Renal/GU Renal InsufficiencyRenal disease  negative genitourinary   Musculoskeletal  (+) Arthritis ,   Abdominal   Peds  Hematology  (+) Blood dyscrasia (Hgb 7.7), anemia ,   Anesthesia Other Findings R AKA stump infection  Reproductive/Obstetrics                            Anesthesia Physical  Anesthesia Plan  ASA: III  Anesthesia Plan: General   Post-op Pain Management:     Induction: Intravenous  PONV Risk Score and Plan: 1 and Ondansetron, Midazolam, Dexamethasone and Treatment may vary due to age or medical condition  Airway Management Planned: LMA  Additional Equipment: None  Intra-op Plan:   Post-operative Plan: Extubation in OR  Informed Consent: I have reviewed the patients History and Physical, chart, labs and discussed the procedure including the risks, benefits and alternatives for the proposed anesthesia with the patient or authorized representative who has indicated his/her understanding and acceptance.     Dental advisory given  Plan Discussed with:   Anesthesia Plan Comments:        Anesthesia Quick Evaluation

## 2019-06-23 NOTE — Progress Notes (Signed)
Pharmacy Antibiotic Note  Gregory [name] is a 59 y.o. male admitted on 06/22/2019 with R AKA stump infection.  Pharmacy has been consulted for Vancomycin dosing.      Vancomycin 2gm IV loading dose given 1/4 pm and 750 mg IV q24hrs to begin tonight.  However, dosed on 68" and 99.8 kg, and current ht listed as 72" and weight 112.3 kg.  Confirmed ht/wt with patient.   Plan: Adjust Vancomycin from 750 mg > 1gm IV q24hrs to begin tonight. Goal AUC 400-550. Expected AUC: 424 SCr used: 1.90 Continue Cefepime 2gm IV q12hrs. For stump revision on 1/6 am. Will follow renal function, culture data, clinical progress and antibiotic plans.   Height: 6' (182.9 cm) Weight: 247 lb 9.2 oz (112.3 kg) IBW/kg (Calculated) : 77.6  Temp (24hrs), Avg:98.7 F (37.1 C), Min:98.2 F (36.8 C), Max:99.3 F (37.4 C)  Recent Labs  Lab 06/22/19 1037  WBC 10.6*  CREATININE 1.90*  LATICACIDVEN 0.9    Estimated Creatinine Clearance: 53.5 mL/min (A) (by C-G formula based on SCr of 1.9 mg/dL (H)).    Allergies  Allergen Reactions  . Latex Other (See Comments)    Listed on MAR- pt sts he is not allergic.    Antimicrobials this admission: Vancomycin 1/4 >> Cefepime 1/4>>  Dose adjustments this admission:  1/5:  New height/weight > maintenance dose adjusted before 1st dose given  Microbiology results: 1/4 Wound Cx ((R leg stump): abundant GPC, mod GNR - reincubated 1/4 Blood x 1: ng x 1 day to date 1/4 COVID: negative  Thank you for allowing pharmacy to be a part of this patient's care.  Arty Baumgartner, [place] Phone: [phone] 06/23/2019 3:22 PM

## 2019-06-23 NOTE — Social Work (Signed)
Pt from [facility] [employer] [facility] [employer], [place] continuing to follow for support with disposition when medically appropriate.  Westley Hummer, MSW, [place] Work [phone]

## 2019-06-23 NOTE — Progress Notes (Signed)
PROGRESS NOTE    Gregory [name]  [name] DOB: [birth year] DOA: 06/22/2019 PCP: [name], MD   Brief Narrative:  HPI on 06/22/19 by Dr. Vita Erm is a 59 y.o. 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  He denies any fever chills.  Recently patient underwent revision of the right above-the-knee amputation, he was discharged home with 1 week of antibiotics on December 13.  Assessment & Plan   Right lower extremity/stump infection/cellulitis -Of note, Patient has had multiple positive MRSA screenings in the past -Started on vancomycin and cefepime -Orthopedic surgery consulted and appreciated, plan for surgery on 06/24/2019 or 06/26/2019 pending OR schedule -Continue pain control, have started patient on oxycodone 20 mg every 6 hours as needed.  Continue IV Dilaudid as needed -Blood culture showed no growth to date  Chronic kidney disease, stage IIIb -On review of patient's chart, his GFR has been in stage IIIb range since 2019 -Continue to monitor BMP  Diabetes mellitus, type II -Hemoglobin A1c 8.6 -Continue insulin sliding scale with CBG monitoring  Essential hypertension -Continue amlodipine, Lasix, metoprolol  Chronic diastolic heart failure -Appears to be euvolemic and compensated -Continue Lasix, intake and output, daily weights  Anemia of chronic disease -hemoglobin stable, continue monitor CBC  Depression/anxiety -Continue Paxil, Wellbutrin  DVT Prophylaxis Heparin  Code Status: Full  Family Communication: None at bedside  Disposition Plan: Admitted.  Pending surgery.  Disposition  TBD  Consultants Orthopedic surgery  Procedures  None  Antibiotics   Anti-infectives (From admission, onward)   Start     Dose/Rate Route Frequency Ordered Stop   06/23/19 1800  vancomycin (VANCOREADY) IVPB 750 mg/150 mL     750 mg 150 mL/hr over 60 Minutes Intravenous Every 24 hours 06/22/19 1757     06/23/19 1000  ceFEPIme (MAXIPIME) 2 g in sodium chloride 0.9 % 100 mL IVPB     2 g 200 mL/hr over 30 Minutes Intravenous Every 12 hours 06/22/19 2044     06/22/19 1900  ceFEPIme (MAXIPIME) 2 g in sodium chloride 0.9 % 100 mL IVPB     2 g 200 mL/hr over 30 Minutes Intravenous  Once 06/22/19 1851 06/22/19 2249   06/22/19 1800  vancomycin (VANCOREADY) IVPB 2000 mg/400 mL     2,000 mg 200 mL/hr over 120 Minutes Intravenous  Once 06/22/19 1757 06/22/19 2242   06/22/19 1730  vancomycin (VANCOREADY) IVPB 1500 mg/300 mL  Status:  Discontinued     1,500 mg 150 mL/hr over 120 Minutes Intravenous  Once 06/22/19 1723 06/22/19 1757   06/22/19 1645  piperacillin-tazobactam (ZOSYN) IVPB 3.375 g     3.375 g 100 mL/hr over 30 Minutes Intravenous  Once 06/22/19 1634 06/22/19 1752      Subjective:   Gregory [name] seen and examined today.  Continues to complain of pain in the stump.  States he was taking oxycodone 20 mg at the nursing facility.  Does not feel that Dilaudid helps him.  Denies current chest pain or shortness of breath, abdominal pain, nausea or vomiting, diarrhea or constipation, dizziness or headache.  Objective:   Vitals:   06/22/19 1300 06/22/19 1950 06/23/19 0347 06/23/19 0826  BP: 138/69 (!) 172/88 Marland Kitchen)  134/102   Pulse: 77 82 87   Resp: 19 18 18 16   Temp: 98.6 F (37 C) 98.5 F (36.9 C) 99.3 F (37.4 C)   TempSrc: Oral Oral Oral   SpO2: 93% 95% 97%   Weight:  112.3 kg    Height:  6' (1.829 m)      Intake/Output Summary (Last 24 hours) at 06/23/2019 1345 Last data filed at 06/23/2019 1012 Gross per 24 hour  Intake 940 ml  Output 700 ml  Net 240 ml   Filed Weights    06/22/19 1035 06/22/19 1950  Weight: 99.8 kg 112.3 kg    Exam  General: Well developed, chronically appearing, NAD  HEENT: NCAT,  mucous membranes moist.   Cardiovascular: S1 S2 auscultated, RRR  Respiratory: Clear to auscultation bilaterally with equal chest rise  Abdomen: Soft, nontender, nondistended, + bowel sounds  Extremities: Right AKA, with drainage and mild erythema of the stump.  Neuro: AAOx3, nonfocal  Psych: Appropriate mood and affect   Data Reviewed: I have personally reviewed following labs and imaging studies  CBC: Recent Labs  Lab 06/22/19 1037  WBC 10.6*  NEUTROABS 7.6  HGB 9.4*  HCT 32.2*  MCV 89.2  PLT [id number]   Basic Metabolic Panel: Recent Labs  Lab 06/22/19 1037  NA 138  K 4.3  CL 101  CO2 27  GLUCOSE 147*  BUN 26*  CREATININE 1.90*  CALCIUM 8.9   GFR: Estimated Creatinine Clearance: 53.5 mL/min (A) (by C-G formula based on SCr of 1.9 mg/dL (H)). Liver Function Tests: Recent Labs  Lab 06/22/19 1037  AST 10*  ALT 11  ALKPHOS 90  BILITOT 0.4  PROT 7.2  ALBUMIN 2.6*   No results for input(s): LIPASE, AMYLASE in the last 168 hours. No results for input(s): AMMONIA in the last 168 hours. Coagulation Profile: Recent Labs  Lab 06/22/19 1037  INR 1.0   Cardiac Enzymes: No results for input(s): CKTOTAL, CKMB, CKMBINDEX, TROPONINI in the last 168 hours. BNP (last 3 results) No results for input(s): PROBNP in the last 8760 hours. HbA1C: No results for input(s): HGBA1C in the last 72 hours. CBG: Recent Labs  Lab 06/22/19 2109 06/23/19 0741 06/23/19 1145  GLUCAP 202* 184* 231*   Lipid Profile: No results for input(s): CHOL, HDL, LDLCALC, TRIG, CHOLHDL, LDLDIRECT in the last 72 hours. Thyroid Function Tests: No results for input(s): TSH, T4TOTAL, FREET4, T3FREE, THYROIDAB in the last 72 hours. Anemia Panel: No results for input(s): VITAMINB12, FOLATE, FERRITIN, TIBC, IRON, RETICCTPCT in the last 72 hours. Urine analysis:     Component Value Date/Time   COLORURINE YELLOW 06/22/2019 2053   APPEARANCEUR CLEAR 06/22/2019 2053   LABSPEC 1.014 06/22/2019 2053   PHURINE 5.0 06/22/2019 2053   GLUCOSEU NEGATIVE 06/22/2019 2053   HGBUR NEGATIVE 06/22/2019 2053   [place] NEGATIVE 06/22/2019 2053   KETONESUR NEGATIVE 06/22/2019 2053   PROTEINUR 30 (A) 06/22/2019 2053   UROBILINOGEN 1.0 02/03/2014 0052   NITRITE NEGATIVE 06/22/2019 2053   LEUKOCYTESUR NEGATIVE 06/22/2019 2053   Sepsis Labs: @LABRCNTIP (procalcitonin:4,lacticidven:4)  ) Recent Results (from the past 240 hour(s))  Culture, blood (single)     Status: None (Preliminary result)   Collection Time: 06/22/19 11:04 AM   Specimen: BLOOD RIGHT ARM  Result Value Ref Range Status   Specimen Description BLOOD RIGHT ARM  Final   Special Requests   Final    BOTTLES DRAWN AEROBIC AND ANAEROBIC Blood Culture adequate volume   Culture   Final  NO GROWTH 1 DAY Performed at [facility], [place] [address] [place], [place] [address]    Report Status PENDING  Incomplete  Wound or Superficial Culture     Status: None (Preliminary result)   Collection Time: 06/22/19  4:40 PM   Specimen: Wound  Result Value Ref Range Status   Specimen Description WOUND RIGHT LEG  Final   Special Requests STUMP SITE  Final   Gram Stain   Final    FEW WBC PRESENT, PREDOMINANTLY PMN ABUNDANT GRAM POSITIVE COCCI MODERATE GRAM NEGATIVE RODS    Culture   Final    CULTURE REINCUBATED FOR BETTER GROWTH Performed at [facility], [place] [address], [place], [place] [address]    Report Status PENDING  Incomplete  SARS CORONAVIRUS 2 (TAT 6-24 HRS) Nasopharyngeal Nasopharyngeal Swab     Status: None   Collection Time: 06/22/19  7:58 PM   Specimen: Nasopharyngeal Swab  Result Value Ref Range Status   SARS Coronavirus 2 NEGATIVE NEGATIVE Final    Comment: (NOTE) SARS-CoV-2 target nucleic acids are NOT DETECTED. The SARS-CoV-2 RNA is generally detectable in upper and  lower respiratory specimens during the acute phase of infection. Negative results do not preclude SARS-CoV-2 infection, do not rule out co-infections with other pathogens, and should not be used as the sole basis for treatment or other patient management decisions. Negative results must be combined with clinical observations, patient history, and epidemiological information. The expected result is Negative. Fact Sheet for Patients: SugarRoll.be Fact Sheet for Healthcare Providers: https://www.woods-mathews.com/ This test is not yet approved or cleared by the [place] FDA and  has been authorized for detection and/or diagnosis of SARS-CoV-2 by FDA under an Emergency Use Authorization (EUA). This EUA will remain  in effect (meaning this test can be used) for the duration of the COVID-19 declaration under Section 56 4(b)(1) of the Act, 21 U.S.C. section 360bbb-3(b)(1), unless the authorization is terminated or revoked sooner. Performed at [facility], [place] [address], [place], [place] [address]       Radiology Studies: No results found.   Scheduled Meds: . amLODipine  10 mg Oral Daily  . atorvastatin  20 mg Oral QHS  . buPROPion  300 mg Oral QHS  . docusate sodium  100 mg Oral BID  . ferrous sulfate  325 mg Oral Q breakfast  . finasteride  5 mg Oral Daily  . fluticasone  1 spray Each Nare Daily  . furosemide  40 mg Oral Daily  . heparin  5,000 Units Subcutaneous Q8H  . insulin aspart  0-15 Units Subcutaneous TID WC  . loratadine  10 mg Oral Daily  . magnesium oxide  400 mg Oral BID  . metoprolol tartrate  50 mg Oral Daily  . mirabegron ER  50 mg Oral Daily  . nicotine  14 mg Transdermal QHS  . oxybutynin  15 mg Oral QHS  . pantoprazole  40 mg Oral Daily  . PARoxetine  10 mg Oral QHS  . pregabalin  150 mg Oral BID  . senna-docusate  2 tablet Oral QHS  . tamsulosin  0.8 mg Oral QPM  . umeclidinium bromide  1 puff  Inhalation Daily  . vitamin B-12  1,000 mcg Oral Daily   Continuous Infusions: . ceFEPime (MAXIPIME) IV 2 g (06/23/19 0929)  . vancomycin       LOS: 1 day   Time Spent in minutes   45 minutes  Acheron Sugg D.O. on 06/23/2019  at 1:45 PM  Between 7am to 7pm - Please see pager noted on [email or web address]  After 7pm go to [email or web address]  And look for the night coverage person covering for me after hours  [name] Office  [phone]

## 2019-06-23 NOTE — Consult Note (Signed)
WOC Nurse Consult Note: Patient receiving care in [facility] [phone]. [name] surgeon per record and has been consulted for this admission as well. Consult completed remotely after review of record, including wound image. Reason for Consult: "right leg" Wound type: open wound involving the surgical stump with chronic osteomyelitis Pressure Injury POA: Yes/No/NA Measurement: Wound bed: see image Drainage (amount, consistency, odor) purulent per record Periwound: intact  Dressing procedure/placement/frequency: Place Aquacel Kellie Simmering [phone]) into the wound of the right stump. Cover with ABD pads. Secure. Change daily and prn saturation. [facility] nurse will not follow at this time.  Please re-consult the [facility] team if needed.  Val Riles, RN, MSN, CWOCN, CNS-BC, pager [phone]

## 2019-06-23 NOTE — H&P (View-Only) (Signed)
ORTHOPAEDIC CONSULTATION  REQUESTING PHYSICIAN: Cristal Ford, DO  Chief Complaint: Dehiscence cellulitis and pain right above-the-knee amputation.  HPI: Gregory [name] is a 59 y.o. male who presents with dehiscence and infection of the right above-knee amputation.  After the last surgery when the patient returned to the room after surgery he removed the wound VAC dressing and manipulated the wound.  In follow-up visits in the office patient continued to manipulate the wound.  The wound has progressively broken down and patient has a deep infection with exposed bone.  Past Medical History:  Diagnosis Date  . Acquired absence of left great toe ([place])   . Acquired absence of right leg below knee ([place])   . Acute osteomyelitis of calcaneum, right ([place]) 10/02/2016  . Acute respiratory failure ([place])   . Amputation stump infection ([place]) 08/12/2018  . Anemia   . Basal cell carcinoma, eyelid   . Cancer ([place])   . Candidiasis 08/12/2018  . CHF (congestive heart failure) (HCC)    chronic diastolic   . Chronic back pain   . Chronic kidney disease    stage 3   . Chronic multifocal osteomyelitis (HCC)    of ankle and foot   . Chronic pain syndrome   . COPD (chronic obstructive pulmonary disease) ([place])   . DDD (degenerative disc disease), lumbar   . Depression    major depressive disorder   . Diabetes mellitus without complication ([place])    type 2   . Diabetic ulcer of toe of left foot ([place]) 10/02/2016  . Difficulty in walking, not elsewhere classified   . 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 Foot drop, bilateral   . Foot drop, right 12/27/2015  . GERD (gastroesophageal reflux disease)   . Herpesviral vesicular dermatitis   . Hyperlipidemia   . Hyperlipidemia   . Hypertension   . Insomnia   . Malignant melanoma of other parts of face ([place])   . MRSA bacteremia   . Muscle weakness (generalized)   . Nasal congestion   . Necrosis of toe ([place]) 07/08/2018  . Neuromuscular dysfunction of bladder     . Osteomyelitis ([place])   . Osteomyelitis ([place])   . Osteomyelitis ([place])    right BKA  . Other idiopathic peripheral autonomic neuropathy   . Retention of urine, unspecified   . Squamous cell carcinoma of skin   . Unsteadiness on feet   . Urinary retention   . Urinary retention   . Urinary tract infection   . Wears dentures   . Wears glasses    Past Surgical History:  Procedure Laterality Date  . AMPUTATION Left 03/29/2017   Procedure: LEFT GREAT TOE AMPUTATION AT METATARSOPHALANGEAL JOINT;  Surgeon: [name], MD;  Location: [facility];  Service: Orthopedics;  Laterality: Left;  . AMPUTATION Right 03/29/2017   Procedure: RIGHT BELOW KNEE AMPUTATION;  Surgeon: [name], MD;  Location: [facility];  Service: Orthopedics;  Laterality: Right;  . AMPUTATION Left 06/06/2018   Procedure: LEFT 2ND TOE AMPUTATION;  Surgeon: [name], MD;  Location: [facility];  Service: Orthopedics;  Laterality: Left;  . AMPUTATION Right 11/19/2018   Procedure: AMPUTATION ABOVE KNEE;  Surgeon: [name], MD;  Location: [facility];  Service: Orthopedics;  Laterality: Right;  . ANTERIOR CERVICAL CORPECTOMY N/A 11/25/2015   Procedure: ANTERIOR CERVICAL FIVE CORPECTOMY Cervical four - six fusion;  Surgeon: Consuella Lose, MD;  Location: [facility];  Service: Neurosurgery;  Laterality: N/A;  ANTERIOR CERVICAL FIVE CORPECTOMY  Cervical four - six fusion  . APPLICATION OF WOUND VAC Right 10/22/2018   Procedure: Application Of Wound Vac;  Surgeon: [name], MD;  Location: [facility];  Service: Orthopedics;  Laterality: Right;  . CYSTOSCOPY WITH RETROGRADE URETHROGRAM N/A 04/25/2018   Procedure: CYSTOSCOPY WITH RETROGRADE URETHROGRAM/ BALLOON DILATION;  Surgeon: Kathie Rhodes, MD;  Location: [facility];  Service: Urology;  Laterality: N/A;  . MULTIPLE TOOTH EXTRACTIONS    . POSTERIOR CERVICAL FUSION/FORAMINOTOMY N/A 11/29/2015   Procedure: Cervical Three-Cervical Seven Posterior Cervical Laminectomy with Fusion;  Surgeon:  Consuella Lose, MD;  Location: [facility];  Service: Neurosurgery;  Laterality: N/A;  Cervical Three-Cervical Seven Posterior Cervical Laminectomy with Fusion  . STUMP REVISION Right 10/22/2018   Procedure: REVISION RIGHT BELOW KNEE AMPUTATION;  Surgeon: [name], MD;  Location: [facility];  Service: Orthopedics;  Laterality: Right;  . STUMP REVISION Right 12/05/2018   Procedure: Revision Right Above Knee Amputation;  Surgeon: [name], MD;  Location: [facility];  Service: Orthopedics;  Laterality: Right;  . STUMP REVISION Right 05/29/2019   Procedure: REVISION RIGHT ABOVE KNEE AMPUTATION;  Surgeon: [name], MD;  Location: [facility];  Service: Orthopedics;  Laterality: Right;   Social History   Socioeconomic History  . Marital status: Single    Spouse name: Not on file  . Number of children: Not on file  . Years of education: Not on file  . Highest education level: Not on file  Occupational History  . Not on file  Tobacco Use  . Smoking status: Current Every Day Smoker    Packs/day: 0.50    Types: Cigarettes  . Smokeless tobacco: Never Used  . Tobacco comment: 11/19/18  Substance and Sexual Activity  . Alcohol use: No  . Drug use: No  . Sexual activity: Not on file  Other Topics Concern  . Not on file  Social History Narrative  . Not on file   Social Determinants of Health   Financial Resource Strain:   . Difficulty of Paying Living Expenses: Not on file  Food Insecurity:   . Worried About Charity fundraiser in the Last Year: Not on file  . Ran Out of Food in the Last Year: Not on file  Transportation Needs:   . Lack of Transportation (Medical): Not on file  . Lack of Transportation (Non-Medical): Not on file  Physical Activity:   . Days of Exercise per Week: Not on file  . Minutes of Exercise per Session: Not on file  Stress:   . Feeling of Stress : Not on file  Social Connections:   . Frequency of Communication with Friends and Family: Not on file  .  Frequency of Social Gatherings with Friends and Family: Not on file  . Attends Religious Services: Not on file  . Active Member of Clubs or Organizations: Not on file  . Attends Archivist Meetings: Not on file  . Marital Status: Not on file   Family History  Problem Relation Age of Onset  . Cancer Other   . Diabetes Other    - negative except otherwise stated in the family history section Allergies  Allergen Reactions  . Latex Other (See Comments)    Listed on MAR- pt sts he is not allergic.   Prior to Admission medications   Medication Sig Start Date End Date Taking? Authorizing Provider  amLODipine (NORVASC) 10 MG tablet Take 10 mg by mouth daily. for HTN   Yes  [provider]  atorvastatin (LIPITOR) 20 MG tablet Take 20 mg by mouth at bedtime.   Yes [provider]  furosemide (LASIX) 40 MG tablet Take 40 mg by mouth daily.   Yes [provider]  metoprolol tartrate (LOPRESSOR) 50 MG tablet Take 50 mg by mouth daily.    Yes [provider]  acetaminophen (TYLENOL) 325 MG tablet Take 650 mg by mouth every 4 (four) hours as needed (general discomfort).     [provider]  albuterol (PROVENTIL HFA;VENTOLIN HFA) 108 (90 Base) MCG/ACT inhaler Inhale 2 puffs into the lungs every 6 (six) hours as needed for wheezing or shortness of breath.    [provider]  bisacodyl (DULCOLAX) 10 MG suppository Place 1 suppository (10 mg total) rectally daily as needed for moderate constipation. 12/02/15   [name], MD  buPROPion (WELLBUTRIN XL) 300 MG 24 hr tablet Take 300 mg by mouth at bedtime.    [provider]  calcium carbonate (TUMS - DOSED IN MG ELEMENTAL CALCIUM) 500 MG chewable tablet Chew 2 tablets by mouth every 8 (eight) hours as needed for indigestion or heartburn.    [provider]  cetirizine (ZYRTEC) 10 MG tablet Take 10 mg by mouth daily.    [provider]  CETYLPYRIDINIUM CHLORIDE MT Use as  directed 15 mLs in the mouth or throat every 4 (four) hours as needed (dry mouth).    [provider]  Cholecalciferol (VITAMIN D-3) 5000 units TABS Take 5,000 Units by mouth daily.    [provider]  collagenase (SANTYL) ointment Apply 1 application topically daily. Apply to the affected area daily plus dry dressing 01/20/19   [name], MD  docusate sodium (COLACE) 100 MG capsule Take 1 capsule (100 mg total) by mouth 2 (two) times daily. 05/31/19   Amin, Jeanella Flattery, MD  doxycycline (VIBRA-TABS) 100 MG tablet Take 1 tablet (100 mg total) by mouth 2 (two) times daily. 06/18/19   [name], MD  eszopiclone (LUNESTA) 1 MG TABS tablet Take 1 mg by mouth at bedtime. Take immediately before bedtime    [provider]  ferrous sulfate 325 (65 FE) MG tablet Take 1 tablet (325 mg total) by mouth daily with breakfast. 11/23/18   Matilde Haymaker, MD  finasteride (PROSCAR) 5 MG tablet Take 5 mg by mouth daily.    [provider]  glipiZIDE (GLUCOTROL) 10 MG tablet Take 10 mg by mouth daily before breakfast.    [provider]  glucagon (GLUCAGON EMERGENCY) 1 MG injection Inject 1 mg into the muscle once as needed (for CBG <70).     [provider]  insulin lispro (HUMALOG KWIKPEN) 100 UNIT/ML KwikPen Inject 0-10 Units into the skin See admin instructions. Inject 0-10 units per sliding scale 3 times daily before meals; CBG <70 = notify MD, 0-150 = 0 units, 151-200 = 2 units, 201-250 = 4 units, 251-300 = 6 units, 301-350 = 8 units, 351-400 = 10 units, >400 = notify MD    [provider]  lidocaine (LIDODERM) 5 % Place 1 patch onto the skin daily. Apply to Right stump. Remove & Discard patch within 12 hours or as directed by MD    [provider]  magnesium oxide (MAG-OX) 400 MG tablet Take 400 mg by mouth 2 (two) times daily.    [provider]  Menthol, Topical Analgesic, (BIOFREEZE) 4 % GEL Apply 1 application topically every 4  (four) hours as needed (pain).  Apply to right stump    [provider]  mirabegron ER (MYRBETRIQ) 50 MG TB24 tablet Take 50 mg by mouth daily.    [provider]  mometasone (NASONEX) 50 MCG/ACT nasal spray Place 2 sprays into the nose at bedtime.     [provider]  nicotine (NICODERM CQ - DOSED IN MG/24 HOURS) 14 mg/24hr patch Place 14 mg onto the skin daily.    [provider]  omeprazole (PRILOSEC) 40 MG capsule Take 40 mg by mouth daily before breakfast.     [provider]  ondansetron (ZOFRAN) 4 MG tablet Take 4 mg by mouth every 6 (six) hours as needed for nausea or vomiting.    [provider]  oxybutynin (DITROPAN XL) 15 MG 24 hr tablet Take 15 mg by mouth at bedtime.    [provider]  Oxycodone HCl 20 MG TABS Take 1 tablet (20 mg total) by mouth every 6 (six) hours as needed (pain). 05/31/19   Amin, Jeanella Flattery, MD  oxyCODONE-acetaminophen (PERCOCET) 10-325 MG tablet Take 1 tablet by mouth every 6 (six) hours as needed for pain. 06/18/19   [name], MD  PARoxetine (PAXIL) 20 MG tablet Take 10 mg by mouth at bedtime.     [provider]  polyethylene glycol (MIRALAX / GLYCOLAX) packet Take 17 g by mouth daily as needed for mild constipation. 06/09/18   Shelly Coss, MD  pregabalin (LYRICA) 150 MG capsule Take 1 capsule (150 mg total) by mouth 2 (two) times daily. 10/24/18   Rayburn, Neta Mends, [name]  senna-docusate (SENOKOT-S) 8.6-50 MG tablet Take 2 tablets by mouth at bedtime.    [provider]  tamsulosin (FLOMAX) 0.4 MG CAPS capsule Take 0.8 mg by mouth every evening.  11/26/13   [provider]  terbinafine (LAMISIL) 250 MG tablet Take 250 mg by mouth daily.    [provider]  Tiotropium Bromide Monohydrate (SPIRIVA RESPIMAT) 1.25 MCG/ACT AERS Inhale 2 puffs into the lungs daily.    [provider]  tiZANidine (ZANAFLEX) 4 MG tablet Take 4 mg by mouth every 8  (eight) hours as needed for muscle spasms.    [provider]  vitamin B-12 (CYANOCOBALAMIN) 500 MCG tablet Take 1,000 mcg by mouth daily.     [provider]   No results found. - pertinent xrays, CT, MRI studies were reviewed and independently interpreted  Positive ROS: All other systems have been reviewed and were otherwise negative with the exception of those mentioned in the HPI and as above.  Physical Exam: General: Alert, no acute distress Psychiatric: Patient is competent for consent with normal mood and affect Lymphatic: No axillary or cervical lymphadenopathy Cardiovascular: No pedal edema Respiratory: No cyanosis, no use of accessory musculature GI: No organomegaly, abdomen is soft and non-tender    Images:  @ENCIMAGES @  Labs:  Lab Results  Component Value Date   HGBA1C 8.6 (H) 05/23/2019   HGBA1C 7.7 (H) 11/17/2018   HGBA1C 8.0 (H) 06/04/2018   ESRSEDRATE 102 (H) 05/27/2019   ESRSEDRATE 120 (H) 03/25/2017   ESRSEDRATE 84 (H) 10/02/2016   CRP 10.2 (H) 05/27/2019   CRP 4.9 (H) 03/25/2017   CRP 30.6 (H) 10/02/2016   REPTSTATUS PENDING 06/22/2019   GRAMSTAIN  06/22/2019    FEW WBC PRESENT, PREDOMINANTLY PMN ABUNDANT GRAM POSITIVE COCCI MODERATE GRAM NEGATIVE RODS Performed at [facility], [address] [place], [place] [address]    CULT PENDING 06/22/2019   LABORGA  PROVIDENCIA [name] (A) 01/12/2017    Lab Results  Component Value Date   ALBUMIN 2.6 (L) 06/22/2019   ALBUMIN 3.3 (L) 05/22/2019   ALBUMIN 3.3 (L) 10/22/2018   PREALBUMIN 23.3 03/25/2017    Neurologic: Patient does not have protective sensation bilateral lower extremities.   MUSCULOSKELETAL:   Skin: Examination patient has dehiscence of the right above-the-knee wound there is cellulitis and the wound is open down to bone.  Patient has localized cellulitis but no ascending cellulitis his albumin is 2.6 hemoglobin A1c 8.6.  Assessment: Assessment dehiscence  right above-the-knee amputation.  Plan: Plan: Discussed with the patient his only option is for revision of the above-knee amputation of the right.  Discussed that we would not proceed with surgery unless patient would guarantee that he would not remove the dressing and would not manipulate the wound.  Patient states he understands agrees that he will be compliant.  Plan for surgery Wednesday or Friday depending on operating room availability.  Thank you for the consult and the opportunity to see Mr. Gregory [name], [place] [phone] 9:48 AM

## 2019-06-23 NOTE — NC FL2 (Signed)
[place] LEVEL OF CARE SCREENING TOOL     IDENTIFICATION  Patient Name: Gregory [name] Birthdate: [birth year] Sex: male Admission Date (Current Location): 06/22/2019  [place] and [place] Number:  [name] [id number] [place] and Address:  [facility], [place] [address], [place], [place] [address]      Provider Number: [id number]  Attending Physician Name and Address:  Cristal Ford, DO  Relative Name and Phone Number:       Current Level of Care: Hospital Recommended Level of Care: [place] Prior Approval Number:    Date Approved/Denied:   PASRR Number: [id number]  Discharge Plan: SNF    Current Diagnoses: Patient Active Problem List   Diagnosis Date Noted  . Wound infection 06/22/2019  . Abscess 06/22/2019  . CKD (chronic kidney disease), stage III 05/23/2019  . Tobacco use 05/23/2019  . Sepsis ([place]) 05/23/2019  . S/P AKA (above knee amputation) unilateral, right ([place]) 12/05/2018  . Amputation stump infection ([place])   . Lymphedema of left leg   . Diabetic polyneuropathy associated with type 2 diabetes mellitus ([place])   . Below-knee amputation of right lower extremity ([place]) 10/22/2018  . Dehiscence of amputation stump ([place])   . 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[place]) 08/12/2018  . Candidiasis 08/12/2018  . Necrosis of toe ([place]) 07/08/2018  . Amputated toe of left foot ([place]) 06/18/2018  . Streptococcal infection   . Pressure injury of skin 06/06/2018  . Cutaneous abscess of left foot   . Moderate protein-calorie malnutrition ([place])   . Cellulitis of left leg   . Septic arthritis ([place]) 06/03/2018  . Idiopathic chronic venous hypertension of left lower extremity with ulcer and inflammation ([place]) 11/14/2017  . Great toe amputation status, left 11/14/2017  . Acquired absence of right leg below knee ([place]) 06/20/2017  . Osteomyelitis ([place]) 03/25/2017  . Skin cancer 03/25/2017  . Depression, recurrent ([place])  11/05/2016  . Diabetic ulcer of toe of left foot ([place]) 10/02/2016  . 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 Chronic pain 09/27/2016  . Dyslipidemia associated with type 2 diabetes mellitus ([place]) 06/06/2016  . GERD without esophagitis 05/11/2016  . Hypertensive heart disease with congestive heart failure ([place]) 12/15/2015  . Spinal stenosis in cervical region 12/15/2015  . Type II diabetes mellitus with neurological manifestations ([place]) 12/15/2015  . Chronic diastolic CHF (congestive heart failure) ([place])   . Pulmonary hypertension ([place])   . Urinary retention   . Diskitis   . Foot drop, bilateral   . Sepsis with acute renal failure without septic shock ([place])   . MRSA bacteremia 10/03/2015  . COPD (chronic obstructive pulmonary disease) ([place]) 10/03/2015  . Acute osteomyelitis of left foot ([place]) 10/03/2015    Orientation RESPIRATION BLADDER Height & Weight     Self, Time, Situation, Place  Normal Continent Weight: 247 lb 9.2 oz (112.3 kg) Height:  6' (182.9 cm)  BEHAVIORAL SYMPTOMS/MOOD NEUROLOGICAL BOWEL NUTRITION STATUS      Continent Diet  AMBULATORY STATUS COMMUNICATION OF NEEDS Skin   Extensive Assist Verbally Skin abrasions, Other (Comment)(MASD on left groin; previous RLE AKA with gauze dressing)                       Personal Care Assistance Level of Assistance  Bathing, Feeding, Dressing Bathing Assistance: Maximum assistance Feeding assistance: Independent Dressing Assistance: Maximum assistance     Functional Limitations Info  Sight, Speech, Hearing Sight Info: Impaired Hearing Info: Adequate Speech Info: Adequate  SPECIAL CARE FACTORS FREQUENCY  OT (By licensed OT), PT (By licensed PT)     PT Frequency: 5x week OT Frequency: 5x week            Contractures Contractures Info: Not present    Additional Factors Info  Code Status, Allergies, Psychotropic, Insulin Sliding Scale Code Status Info: Full Code Allergies Info: Latex Psychotropic Info: buPROPion  (WELLBUTRIN XL) 24 hr tablet 300 mg daily at bedtime Insulin Sliding Scale Info: insulin aspart (novoLOG) injection 0-15 Units 3x daily with meals       Current Medications (06/23/2019):  This is the current hospital active medication list Current Facility-Administered Medications  Medication Dose Route Frequency Provider Last Rate Last Admin  . albuterol (PROVENTIL) (2.5 MG/3ML) 0.083% nebulizer solution 3 mL  3 mL Inhalation Q6H PRN Wynetta Fines T, MD      . amLODipine (NORVASC) tablet 10 mg  10 mg Oral Daily Wynetta Fines T, MD   10 mg at 06/23/19 [id number]  . antiseptic oral rinse (CPC / CETYLPYRIDINIUM CHLORIDE 0.05%) solution   Mouth Rinse PRN Wynetta Fines T, MD      . atorvastatin (LIPITOR) tablet 20 mg  20 mg Oral QHS Wynetta Fines T, MD   20 mg at 06/22/19 2028  . bisacodyl (DULCOLAX) suppository 10 mg  10 mg Rectal Daily PRN Wynetta Fines T, MD      . buPROPion (WELLBUTRIN XL) 24 hr tablet 300 mg  300 mg Oral QHS Wynetta Fines T, MD   300 mg at 06/22/19 2028  . calcium carbonate (TUMS - dosed in mg elemental calcium) chewable tablet 400 mg of elemental calcium  2 tablet Oral Q8H PRN Wynetta Fines T, MD      . ceFEPIme (MAXIPIME) 2 g in sodium chloride 0.9 % 100 mL IVPB  2 g Intravenous Q12H Wynetta Fines T, MD 200 mL/hr at 06/23/19 0929 2 g at 06/23/19 0929  . docusate sodium (COLACE) capsule 100 mg  100 mg Oral BID Wynetta Fines T, MD   100 mg at 06/23/19 [id number]  . ferrous sulfate tablet 325 mg  325 mg Oral Q breakfast Wynetta Fines T, MD   325 mg at 06/23/19 0741  . finasteride (PROSCAR) tablet 5 mg  5 mg Oral Daily Wynetta Fines T, MD   5 mg at 06/23/19 [id number]  . fluticasone (FLONASE) 50 MCG/ACT nasal spray 1 spray  1 spray Each Nare Daily Wynetta Fines T, MD      . heparin injection 5,000 Units  5,000 Units Subcutaneous Q8H Lequita Halt, MD   5,000 Units at 06/23/19 0505  . HYDROmorphone (DILAUDID) injection 1 mg  1 mg Intravenous Q3H PRN Wynetta Fines T, MD   1 mg at 06/23/19 0741  . insulin aspart (novoLOG)  injection 0-15 Units  0-15 Units Subcutaneous TID WC Lequita Halt, MD   3 Units at 06/23/19 [id number]  . loratadine (CLARITIN) tablet 10 mg  10 mg Oral Daily Wynetta Fines T, MD   10 mg at 06/23/19 [id number]  . magnesium oxide (MAG-OX) tablet 400 mg  400 mg Oral BID Wynetta Fines T, MD   400 mg at 06/23/19 [id number]  . metoprolol tartrate (LOPRESSOR) tablet 50 mg  50 mg Oral Daily Wynetta Fines T, MD   50 mg at 06/23/19 [id number]  . mirabegron ER (MYRBETRIQ) tablet 50 mg  50 mg Oral Daily Wynetta Fines T, MD   50 mg at 06/23/19 [id number]  . nicotine (NICODERM CQ - dosed in mg/24  hours) patch 14 mg  14 mg Transdermal QHS Wynetta Fines T, MD   14 mg at 06/22/19 2030  . ondansetron (ZOFRAN) tablet 4 mg  4 mg Oral Q6H PRN Lequita Halt, MD   4 mg at 06/23/19 0504  . oxybutynin (DITROPAN XL) 24 hr tablet 15 mg  15 mg Oral QHS Wynetta Fines T, MD   15 mg at 06/22/19 2211  . oxyCODONE (Oxy IR/ROXICODONE) immediate release tablet 20 mg  20 mg Oral Q6H PRN Cristal Ford, DO   20 mg at 06/23/19 1020  . oxyCODONE-acetaminophen (PERCOCET/ROXICET) 5-325 MG per tablet 1 tablet  1 tablet Oral Q6H PRN Cristal Ford, DO       And  . oxyCODONE (Oxy IR/ROXICODONE) immediate release tablet 5 mg  5 mg Oral Q6H PRN Cristal Ford, DO      . pantoprazole (PROTONIX) EC tablet 40 mg  40 mg Oral Daily Wynetta Fines T, MD   40 mg at 06/23/19 [id number]  . PARoxetine (PAXIL) tablet 10 mg  10 mg Oral QHS Wynetta Fines T, MD   10 mg at 06/22/19 2211  . polyethylene glycol (MIRALAX / GLYCOLAX) packet 17 g  17 g Oral Daily PRN Wynetta Fines T, MD      . pregabalin (LYRICA) capsule 150 mg  150 mg Oral BID Wynetta Fines T, MD   150 mg at 06/23/19 [id number]  . senna-docusate (Senokot-S) tablet 2 tablet  2 tablet Oral QHS Lequita Halt, MD   2 tablet at 06/22/19 2028  . tamsulosin (FLOMAX) capsule 0.8 mg  0.8 mg Oral QPM Wynetta Fines T, MD      . tiZANidine (ZANAFLEX) tablet 4 mg  4 mg Oral Q8H PRN Wynetta Fines T, MD   4 mg at 06/23/19 0505  . umeclidinium bromide (INCRUSE  ELLIPTA) 62.5 MCG/INH 1 puff  1 puff Inhalation Daily Lequita Halt, MD   1 puff at 06/23/19 [phone]  . vancomycin (VANCOREADY) IVPB 750 mg/150 mL  750 mg Intravenous Q24H Gillian Scarce, RPH      . vitamin B-12 (CYANOCOBALAMIN) tablet 1,000 mcg  1,000 mcg Oral Daily Wynetta Fines T, MD   1,000 mcg at 06/23/19 [id number]  . zolpidem (AMBIEN) tablet 5 mg  5 mg Oral QHS PRN,MR X 1 Lequita Halt, MD   5 mg at 06/22/19 2212     Discharge Medications: Please see discharge summary for a list of discharge medications.  Relevant Imaging Results:  Relevant Lab Results:   Additional Information [id number] [place], [place]

## 2019-06-24 ENCOUNTER — Encounter (HOSPITAL_COMMUNITY)
Admission: EM | Disposition: A | Discharge status: Skilled Nursing Facility | Payer: Self-pay | Source: Skilled Nursing Facility | Attending: Internal Medicine

## 2019-06-24 ENCOUNTER — Inpatient Hospital Stay (HOSPITAL_COMMUNITY): Payer: Medicaid Other | Admitting: Certified Registered"

## 2019-06-24 DIAGNOSIS — Z89611 Acquired absence of right leg above knee: Secondary | ICD-10-CM

## 2019-06-24 DIAGNOSIS — T148XXA Other injury of unspecified body region, initial encounter: Secondary | ICD-10-CM

## 2019-06-24 DIAGNOSIS — L089 Local infection of the skin and subcutaneous tissue, unspecified: Secondary | ICD-10-CM

## 2019-06-24 DIAGNOSIS — T8781 Dehiscence of amputation stump: Secondary | ICD-10-CM

## 2019-06-24 HISTORY — PX: STUMP REVISION: SHX6102

## 2019-06-24 LAB — BASIC METABOLIC PANEL
Anion gap: 13 (ref 5–15)
BUN: 22 mg/dL — ABNORMAL HIGH (ref 6–20)
CO2: 22 mmol/L (ref 22–32)
Calcium: 8.8 mg/dL — ABNORMAL LOW (ref 8.9–10.3)
Chloride: 101 mmol/L (ref 98–111)
Creatinine, Ser: 1.55 mg/dL — ABNORMAL HIGH (ref 0.61–1.24)
GFR calc Af Amer: 56 mL/min — ABNORMAL LOW (ref >60)
GFR calc non Af Amer: 48 mL/min — ABNORMAL LOW (ref >60)
Glucose, Bld: 269 mg/dL — ABNORMAL HIGH (ref 70–99)
Potassium: 4.5 mmol/L (ref 3.5–5.1)
Sodium: 136 mmol/L (ref 135–145)

## 2019-06-24 LAB — GLUCOSE, CAPILLARY
Glucose-Capillary: 213 mg/dL — ABNORMAL HIGH (ref 70–99)
Glucose-Capillary: 234 mg/dL — ABNORMAL HIGH (ref 70–99)
Glucose-Capillary: 241 mg/dL — ABNORMAL HIGH (ref 70–99)
Glucose-Capillary: 273 mg/dL — ABNORMAL HIGH (ref 70–99)
Glucose-Capillary: 224 mg/dL — ABNORMAL HIGH (ref 70–99)
Glucose-Capillary: 265 mg/dL — ABNORMAL HIGH (ref 70–99)

## 2019-06-24 LAB — CBC
HCT: 30.4 % — ABNORMAL LOW (ref 39.0–52.0)
Hemoglobin: 9 g/dL — ABNORMAL LOW (ref 13.0–17.0)
MCH: 25.4 pg — ABNORMAL LOW (ref 26.0–34.0)
MCHC: 29.6 g/dL — ABNORMAL LOW (ref 30.0–36.0)
MCV: 85.9 fL (ref 80.0–100.0)
Platelets: 293 10*3/uL (ref 150–400)
RBC: 3.54 MIL/uL — ABNORMAL LOW (ref 4.22–5.81)
RDW: 16.5 % — ABNORMAL HIGH (ref 11.5–15.5)
WBC: 10 10*3/uL (ref 4.0–10.5)
nRBC: 0 % (ref 0.0–0.2)

## 2019-06-24 SURGERY — REVISION, AMPUTATION SITE
Anesthesia: General | Site: Leg Upper | Laterality: Right

## 2019-06-24 MED ORDER — SODIUM CHLORIDE 0.9 % IV SOLN
2.0000 g | Freq: Three times a day (TID) | INTRAVENOUS | Status: DC
  Administered 2019-06-24 – 2019-06-26 (×6): 2 g via INTRAVENOUS
  Filled 2019-06-24 (×7): qty 2

## 2019-06-24 MED ORDER — PROPOFOL 10 MG/ML IV BOLUS
INTRAVENOUS | Status: AC
  Filled 2019-06-24: qty 40

## 2019-06-24 MED ORDER — FENTANYL CITRATE (PF) 100 MCG/2ML IJ SOLN
INTRAMUSCULAR | Status: AC
  Filled 2019-06-24: qty 2

## 2019-06-24 MED ORDER — DOCUSATE SODIUM 100 MG PO CAPS: 100.0000 mg | ORAL_CAPSULE | Freq: Two times a day (BID) | ORAL | Status: DC

## 2019-06-24 MED ORDER — ONDANSETRON HCL 4 MG/2ML IJ SOLN
INTRAMUSCULAR | Status: AC
  Filled 2019-06-24: qty 2

## 2019-06-24 MED ORDER — MIDAZOLAM HCL 5 MG/5ML IJ SOLN
INTRAMUSCULAR | Status: DC | PRN
  Administered 2019-06-24: 2 mg via INTRAVENOUS

## 2019-06-24 MED ORDER — FENTANYL CITRATE (PF) 100 MCG/2ML IJ SOLN
INTRAMUSCULAR | Status: DC | PRN
  Administered 2019-06-24 (×5): 50 ug via INTRAVENOUS

## 2019-06-24 MED ORDER — OXYCODONE HCL 5 MG PO TABS
20.0000 mg | ORAL_TABLET | ORAL | Status: DC | PRN
  Administered 2019-06-24 – 2019-06-27 (×16): 20 mg via ORAL
  Filled 2019-06-24 (×16): qty 4

## 2019-06-24 MED ORDER — LIDOCAINE 2% (20 MG/ML) 5 ML SYRINGE
INTRAMUSCULAR | Status: AC
  Filled 2019-06-24: qty 5

## 2019-06-24 MED ORDER — PROPOFOL 10 MG/ML IV BOLUS
INTRAVENOUS | Status: DC | PRN
  Administered 2019-06-24: 50 mg via INTRAVENOUS
  Administered 2019-06-24: 200 mg via INTRAVENOUS

## 2019-06-24 MED ORDER — LIDOCAINE HCL (CARDIAC) PF 100 MG/5ML IV SOSY: PREFILLED_SYRINGE | INTRAVENOUS | Status: DC | PRN

## 2019-06-24 MED ORDER — DEXAMETHASONE SODIUM PHOSPHATE 4 MG/ML IJ SOLN
INTRAMUSCULAR | Status: DC | PRN
  Administered 2019-06-24: 5 mg via INTRAVENOUS

## 2019-06-24 MED ORDER — ONDANSETRON HCL 4 MG/2ML IJ SOLN
INTRAMUSCULAR | Status: DC | PRN
  Administered 2019-06-24: 4 mg via INTRAVENOUS

## 2019-06-24 MED ORDER — ONDANSETRON HCL 4 MG/2ML IJ SOLN
4.0000 mg | Freq: Once | INTRAMUSCULAR | Status: AC | PRN
  Administered 2019-06-24: 4 mg via INTRAVENOUS

## 2019-06-24 MED ORDER — LACTATED RINGERS IV SOLN: INTRAVENOUS | Status: DC | PRN

## 2019-06-24 MED ORDER — OXYCODONE HCL 5 MG PO TABS: 5.0000 mg | ORAL_TABLET | Freq: Once | ORAL | Status: AC | PRN

## 2019-06-24 MED ORDER — FENTANYL CITRATE (PF) 250 MCG/5ML IJ SOLN
INTRAMUSCULAR | Status: AC
  Filled 2019-06-24: qty 5

## 2019-06-24 MED ORDER — FENTANYL CITRATE (PF) 100 MCG/2ML IJ SOLN
25.0000 ug | INTRAMUSCULAR | Status: DC | PRN
  Administered 2019-06-24: 25 ug via INTRAVENOUS
  Administered 2019-06-24: 50 ug via INTRAVENOUS

## 2019-06-24 MED ORDER — 0.9 % SODIUM CHLORIDE (POUR BTL) OPTIME
TOPICAL | Status: DC | PRN
  Administered 2019-06-24: 1000 mL

## 2019-06-24 MED ORDER — MIDAZOLAM HCL 2 MG/2ML IJ SOLN
INTRAMUSCULAR | Status: AC
  Filled 2019-06-24: qty 2

## 2019-06-24 MED ORDER — DEXAMETHASONE SODIUM PHOSPHATE 10 MG/ML IJ SOLN
INTRAMUSCULAR | Status: AC
  Filled 2019-06-24: qty 1

## 2019-06-24 MED ORDER — OXYCODONE HCL 5 MG PO TABS
ORAL_TABLET | ORAL | Status: AC
  Administered 2019-06-24: 5 mg via ORAL
  Filled 2019-06-24: qty 1

## 2019-06-24 MED ORDER — OXYCODONE HCL 5 MG/5ML PO SOLN: 5.0000 mg | Freq: Once | ORAL | Status: AC | PRN

## 2019-06-24 MED ORDER — LIDOCAINE 2% (20 MG/ML) 5 ML SYRINGE
INTRAMUSCULAR | Status: DC | PRN
  Administered 2019-06-24: 100 mg via INTRAVENOUS

## 2019-06-24 MED ORDER — INSULIN ASPART 100 UNIT/ML ~~LOC~~ SOLN
SUBCUTANEOUS | Status: AC
  Administered 2019-06-24: 5 [IU] via SUBCUTANEOUS
  Filled 2019-06-24: qty 1

## 2019-06-24 MED ORDER — VANCOMYCIN HCL 1250 MG/250ML IV SOLN
1250.0000 mg | INTRAVENOUS | Status: DC
  Administered 2019-06-24: 1250 mg via INTRAVENOUS
  Filled 2019-06-24 (×2): qty 250

## 2019-06-24 SURGICAL SUPPLY — 31 items
BLADE SAW RECIP 87.9 MT (BLADE) IMPLANT
BLADE SURG 21 STRL SS (BLADE) ×3 IMPLANT
CANISTER WOUND CARE 500ML ATS (WOUND CARE) ×3 IMPLANT
COVER SURGICAL LIGHT HANDLE (MISCELLANEOUS) ×3 IMPLANT
COVER WAND RF STERILE (DRAPES) ×3 IMPLANT
DRAPE EXTREMITY T 121X128X90 (DISPOSABLE) ×3 IMPLANT
DRAPE HALF SHEET 40X57 (DRAPES) ×3 IMPLANT
DRAPE INCISE IOBAN 66X45 STRL (DRAPES) ×3 IMPLANT
DRAPE U-SHAPE 47X51 STRL (DRAPES) ×6 IMPLANT
DRESSING PREVENA PLUS CUSTOM (GAUZE/BANDAGES/DRESSINGS) ×1 IMPLANT
DRSG PREVENA PLUS CUSTOM (GAUZE/BANDAGES/DRESSINGS) ×3
DURAPREP 26ML APPLICATOR (WOUND CARE) ×3 IMPLANT
ELECT REM PT RETURN 9FT ADLT (ELECTROSURGICAL) ×3
ELECTRODE REM PT RTRN 9FT ADLT (ELECTROSURGICAL) ×1 IMPLANT
GLOVE BIOGEL PI IND STRL 9 (GLOVE) ×1 IMPLANT
GLOVE BIOGEL PI INDICATOR 9 (GLOVE) ×2
GLOVE SURG ORTHO 9.0 STRL STRW (GLOVE) ×3 IMPLANT
GOWN STRL REUS W/ TWL XL LVL3 (GOWN DISPOSABLE) ×2 IMPLANT
GOWN STRL REUS W/TWL XL LVL3 (GOWN DISPOSABLE) ×6
KIT BASIN OR (CUSTOM PROCEDURE TRAY) ×3 IMPLANT
KIT TURNOVER KIT B (KITS) ×3 IMPLANT
MANIFOLD NEPTUNE II (INSTRUMENTS) ×3 IMPLANT
NS IRRIG 1000ML POUR BTL (IV SOLUTION) ×3 IMPLANT
PACK GENERAL/GYN (CUSTOM PROCEDURE TRAY) ×3 IMPLANT
PAD ARMBOARD 7.5X6 YLW CONV (MISCELLANEOUS) ×3 IMPLANT
PREVENA RESTOR ARTHOFORM 46X30 (CANNISTER) ×3 IMPLANT
STAPLER VISISTAT 35W (STAPLE) IMPLANT
SUT ETHILON 2 0 PSLX (SUTURE) ×10 IMPLANT
SUT SILK 2 0 (SUTURE)
SUT SILK 2-0 18XBRD TIE 12 (SUTURE) IMPLANT
TOWEL GREEN STERILE (TOWEL DISPOSABLE) ×3 IMPLANT

## 2019-06-24 NOTE — Interval H&P Note (Signed)
History and Physical Interval Note:  06/24/2019 7:26 AM  Gregory [name]  has presented today for surgery, with the diagnosis of right dehiscence below knee amputation.  The various methods of treatment have been discussed with the patient and family. After consideration of risks, benefits and other options for treatment, the patient has consented to  Procedure(s): STUMP REVISION (Right) as a surgical intervention.  The patient's history has been reviewed, patient examined, no change in status, stable for surgery.  I have reviewed the patient's chart and labs.  Questions were answered to the patient's satisfaction.     [name]

## 2019-06-24 NOTE — Anesthesia Procedure Notes (Signed)
Procedure Name: LMA Insertion Date/Time: 06/24/2019 8:26 AM Performed by: Griffin Dakin, CRNA Pre-anesthesia Checklist: Patient identified, Emergency Drugs available, Suction available and Patient being monitored Patient Re-evaluated:Patient Re-evaluated prior to induction Oxygen Delivery Method: Circle system utilized Preoxygenation: Pre-oxygenation with 100% oxygen Induction Type: IV induction Ventilation: Mask ventilation without difficulty LMA: LMA inserted LMA Size: 5.0 Tube type: Oral Number of attempts: 1 Placement Confirmation: positive ETCO2 and breath sounds checked- equal and bilateral Tube secured with: Tape Dental Injury: Teeth and Oropharynx as per pre-operative assessment

## 2019-06-24 NOTE — Transfer of Care (Signed)
Immediate Anesthesia Transfer of Care Note  Patient: Gregory [name]  Procedure(s) Performed: STUMP REVISION (Right Leg Upper)  Patient Location: PACU  Anesthesia Type:General  Level of Consciousness: awake, alert  and oriented  Airway & Oxygen Therapy: Patient Spontanous Breathing and Patient connected to nasal cannula oxygen  Post-op Assessment: Report given to RN and Post -op Vital signs reviewed and stable  Post vital signs: Reviewed and stable  Last Vitals:  Vitals Value Taken Time  BP 134/80 06/24/19 0927  Temp    Pulse 83 06/24/19 0929  Resp 19 06/24/19 0929  SpO2 94 % 06/24/19 0929  Vitals shown include unvalidated device data.  Last Pain:  Vitals:   06/24/19 0458  TempSrc: Oral  PainSc:          Complications: No apparent anesthesia complications

## 2019-06-24 NOTE — Progress Notes (Signed)
Pharmacy Antibiotic Note  Gregory [name] is a 59 y.o. male admitted on 06/22/2019 with R AKA stump infection.  Pharmacy has been consulted for Vancomycin dosing.      Vanc dosed 1/4 on 68" and 99.8 kg, then ht listed as 72" and weight 112.3 kg on 1/5.  Confirmed ht/wt with patient and Vanc regimen adjusted.     s/p AKA revision this am. Scr improved 1.9 > 1.55  Plan: Adjust Vancomycin from 1gm > 1250 mg IV q24hrs to begin tonight. Goal AUC 400-550. Expected AUC: 440 SCr used: 1.44 Adjust Cefepime 2gm IV q12 > q8hrs. Will follow renal function, culture data, clinical progress and antibiotic plans.   Height: 6' (182.9 cm) Weight: 247 lb 9.2 oz (112.3 kg) IBW/kg (Calculated) : 77.6  Temp (24hrs), Avg:98.3 F (36.8 C), Min:97.9 F (36.6 C), Max:98.9 F (37.2 C)  Recent Labs  Lab 06/22/19 1037 06/24/19 1104  WBC 10.6* 10.0  CREATININE 1.90* 1.55*  LATICACIDVEN 0.9  --     Estimated Creatinine Clearance: 65.6 mL/min (A) (by C-G formula based on SCr of 1.55 mg/dL (H)).    Allergies  Allergen Reactions  . Latex Other (See Comments)    Listed on MAR- pt sts he is not allergic.    Antimicrobials this admission: Vancomycin 1/4 >> Cefepime 1/4>>  Dose adjustments this admission:  1/5:  New height/weight > maintenance dose adjusted before 1st dose given  1/6: Scr improved > adjusted Vanc 1gm > 1250 mg IV q24hrs and                                  Cefepime 2 gm IV q12 > q8hrs  Microbiology results: 1/4 Wound Cx ((R leg stump): abundant GNR, moderate Staph aureus - pending 1/4 Blood x 1: no growth x 2 days to date 1/4 COVID: negative  Thank you for allowing pharmacy to be a part of this patient's care.  Arty Baumgartner, [place] Phone: [phone] 06/24/2019 3:23 PM

## 2019-06-24 NOTE — TOC Initial Note (Signed)
Transition of Care [facility]) - Initial/Assessment Note    Patient Details  Name: Gregory [name] MRN: [id number] Date of Birth: March 20, 1961  Transition of Care [facility]) CM/SW Contact:    Alexander Mt, [place] Phone Number: 06/24/2019, 1:23 PM  Clinical Narrative:                 CSW went by pt room-pt post op and calling out in pain. RN aware. CSW familiar with pt- he is a LTC resident at [facility]. CSW called and spoke with his daughter Joellen Jersey. Confirmed plan for pt return to bed at SNF when medically ready. Encouraged her to call and speak with pt, CSW, or RN if she had questions. Pt will need new COVID test prior to dc.   Expected Discharge Plan: Skilled Nursing Facility Barriers to Discharge: Continued Medical Work up   Patient Goals and CMS Choice Patient states their goals for this hospitalization and ongoing recovery are:: for him to return to SNF when ready CMS Medicare.gov Compare Post Acute Care list provided to:: Patient Choice offered to / list presented to : Patient, Adult Children  Expected Discharge Plan and Services Expected Discharge Plan: Skilled Nursing Facility In-house Referral: Clinical Social Work Discharge Planning Services: CM Consult Post Acute Care Choice: Resumption of Svcs/PTA Provider, [place] Living arrangements for the past 2 months: [place]  Prior Living Arrangements/Services Living arrangements for the past 2 months: [place] Lives with:: Facility Resident Patient language and need for interpreter reviewed:: Yes(no needs) Do you feel safe going back to the place where you live?: Yes      Need for Family Participation in Patient Care: Yes (Comment)(assistance with all ADL/IADLs) Care giver support system in place?: Yes (comment)   Criminal Activity/Legal Involvement Pertinent to Current Situation/Hospitalization: No - Comment as needed  Activities of Daily Living Home Assistive Devices/Equipment: Eyeglasses ADL  Screening (condition at time of admission) Patient's cognitive ability adequate to safely complete daily activities?: Yes Is the patient deaf or have difficulty hearing?: No Does the patient have difficulty seeing, even when wearing glasses/contacts?: No Does the patient have difficulty concentrating, remembering, or making decisions?: No Patient able to express need for assistance with ADLs?: Yes Does the patient have difficulty dressing or bathing?: No Independently performs ADLs?: Yes (appropriate for developmental age) Does the patient have difficulty walking or climbing stairs?: Yes Weakness of Legs: Left(Rt AKA) Weakness of Arms/Hands: None  Permission Sought/Granted Permission sought to share information with : Family Supports Permission granted to share information with : Yes, Verbal Permission Granted  Share Information with NAME: [name]  Permission granted to share info w AGENCY: [place] granted to share info w Relationship: daughter  Permission granted to share info w Contact Information: [phone]  Emotional Assessment Appearance:: Appears stated age Attitude/Demeanor/Rapport: Reactive Affect (typically observed): Frustrated Orientation: : Oriented to Self, Oriented to Place, Oriented to  Time, Oriented to Situation Alcohol / Substance Use: Not Applicable Psych Involvement: No (comment)  Admission diagnosis:  Abscess [L02.91] Wound infection [T14.8XXA, L08.9] Patient Active Problem List   Diagnosis Date Noted  . Wound infection 06/22/2019  . Abscess 06/22/2019  . CKD (chronic kidney disease), stage III 05/23/2019  . Tobacco use 05/23/2019  . Sepsis ([place]) 05/23/2019  . S/P AKA (above knee amputation) unilateral, right ([place]) 12/05/2018  . Amputation stump infection ([place])   . Lymphedema of left leg   . Diabetic polyneuropathy associated with type 2 diabetes mellitus ()   .  Below-knee amputation of right lower extremity ([place])  10/22/2018  . Dehiscence of amputation stump ([place])   . 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[place]) 08/12/2018  . Candidiasis 08/12/2018  . Necrosis of toe ([place]) 07/08/2018  . Amputated toe of left foot ([place]) 06/18/2018  . Streptococcal infection   . Pressure injury of skin 06/06/2018  . Cutaneous abscess of left foot   . Moderate protein-calorie malnutrition ([place])   . Cellulitis of left leg   . Septic arthritis ([place]) 06/03/2018  . Idiopathic chronic venous hypertension of left lower extremity with ulcer and inflammation ([place]) 11/14/2017  . Great toe amputation status, left 11/14/2017  . Acquired absence of right leg below knee ([place]) 06/20/2017  . Osteomyelitis ([place]) 03/25/2017  . Skin cancer 03/25/2017  . Depression, recurrent ([place]) 11/05/2016  . Diabetic ulcer of toe of left foot ([place]) 10/02/2016  . 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 Chronic pain 09/27/2016  . Dyslipidemia associated with type 2 diabetes mellitus ([place]) 06/06/2016  . GERD without esophagitis 05/11/2016  . Hypertensive heart disease with congestive heart failure ([place]) 12/15/2015  . Spinal stenosis in cervical region 12/15/2015  . Type II diabetes mellitus with neurological manifestations ([place]) 12/15/2015  . Chronic diastolic CHF (congestive heart failure) ([place])   . Pulmonary hypertension ([place])   . Urinary retention   . Diskitis   . Foot drop, bilateral   . Sepsis with acute renal failure without septic shock ([place])   . MRSA bacteremia 10/03/2015  . COPD (chronic obstructive pulmonary disease) ([place]) 10/03/2015  . Acute osteomyelitis of left foot ([place]) 10/03/2015   PCP:  [name], MD Pharmacy:   [facility], [place] [address] [place] [address] Phone: [phone] Fax: [phone]   Readmission Risk Interventions Readmission Risk Prevention Plan 06/23/2019 12/07/2018 11/20/2018  Transportation Screening Complete Complete Complete  PCP or Specialist  Appt within 3-5 Days Not Complete - -  Not Complete comments SNF pt - -  [place] or Home Care Consult Complete - -  Social Work Consult for Deport Planning/Counseling Complete - -  Palliative Care Screening Not Applicable - -  Medication Review ([place]) Referral to Pharmacy Complete Complete  PCP or Specialist appointment within 3-5 days of discharge Not Complete Complete Complete  PCP/Specialist Appt Not Complete comments SNF pt - -  HRI or Home Care Consult Complete Not Complete Not Complete  HRI or Home Care Consult Pt Refusal Comments - pt from SNF LTC pt is LTC SNF resident  SW Recovery Care/Counseling Consult Complete Complete Complete  Palliative Care Screening Not Applicable Not Applicable Not Applicable  Skilled Nursing Facility Complete Complete Complete  Some recent data might be hidden

## 2019-06-24 NOTE — Op Note (Signed)
06/24/2019  9:24 AM  PATIENT:  Gregory [name]    PRE-OPERATIVE DIAGNOSIS:  right dehiscence below knee amputation  POST-OPERATIVE DIAGNOSIS:  Same  PROCEDURE:  STUMP REVISION Revision right above-the-knee amputation. Application of [name] customizable and water form wound VAC.  SURGEON:  [name], MD  PHYSICIAN ASSISTANT:None ANESTHESIA:   General  PREOPERATIVE INDICATIONS:  Gregory [name] is a  59 y.o. male with a diagnosis of right dehiscence below knee amputation who failed conservative measures and elected for surgical management.    The risks benefits and alternatives were discussed with the patient preoperatively including but not limited to the risks of infection, bleeding, nerve injury, cardiopulmonary complications, the need for revision surgery, among others, and the patient was willing to proceed.  OPERATIVE IMPLANTS: [name] wound VAC sponges x2  @ENCIMAGES @  OPERATIVE FINDINGS: Large necrotic wound dehiscence after manipulation of the wound by the patient.  OPERATIVE PROCEDURE: Patient brought the operating room and underwent a general anesthetic.  After adequate levels anesthesia were obtained patient's right lower extremity was prepped using DuraPrep draped into a sterile field a timeout was called.  A fishmouth incision was made around the infected necrotic tissue this was carried down through healthy viable muscle.  The vascular bundle medially was suture ligated with 2-0 silk 2 cm of the femur was resected the tissue margins were clear the wound was irrigated with normal saline electrocautery was used for further hemostasis the deep superficial fascia layers and skin was closed using 2-0 nylon [name] customizable and arthroform wound vacs were applied this had a good suction fit patient was extubated taken the PACU in stable condition.   DISCHARGE PLANNING:  Antibiotic duration: Continue IV antibiotics  Weightbearing: Nonweightbearing on the right  Pain  medication: Opioid pathway  Dressing care/ Wound VAC: Wound VAC continue at discharge  Ambulatory devices: Walker wheelchair  Discharge to: Skilled nursing  Follow-up: In the office 1 week post operative.

## 2019-06-24 NOTE — Progress Notes (Signed)
PROGRESS NOTE    Gregory [name]  [name] DOB: [birth year] DOA: 06/22/2019 PCP: Gregory Marble, MD   Brief Narrative:  HPI on 06/22/19 by Dr. Vita Erm is a 59 y.o. 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  He denies any fever chills.  Recently patient underwent revision of the right above-the-knee amputation, he was discharged home with 1 week of antibiotics on December 13.  Interim history Admitted with right lower extremity stump infection/cellulitis.  Orthopedic surgery consulted, status post revision right above-the-knee amputation and application of Prevena customizable and water form wound VAC.  Assessment & Plan   Right lower extremity/stump infection/cellulitis -Of note, Patient has had multiple positive MRSA screenings in the past -Started on vancomycin and cefepime -Orthopedic surgery consulted and appreciated, status post revision of right AKA and application of wound VAC -Continue pain control, have changed oxycodone 20mg  q4hr PRN.  Continue IV Dilaudid as needed -Blood culture showed no growth to date -continue zanaflex  Chronic kidney disease, stage IIIb -On review of patient's chart, his GFR has been in stage IIIb range since 2019 -Creatinine stable, currently 1.55 -Continue to monitor BMP  Diabetes mellitus, type II -Hemoglobin A1c 8.6 -Continue insulin sliding scale with CBG monitoring  Essential hypertension -Continue amlodipine, Lasix, metoprolol  Chronic diastolic heart failure -Appears to be euvolemic and compensated -Continue Lasix, intake and output, daily weights  Anemia of chronic  disease -hemoglobin stable, currently 9, continue monitor CBC  Depression/anxiety -Continue Paxil, Wellbutrin  DVT Prophylaxis Heparin  Code Status: Full  Family Communication: None at bedside  Disposition Plan: Admitted.  Status post surgery today, disposition TBD  Consultants Orthopedic surgery  Procedures  Revision of right AKA and application of Prevena customizable water form wound VAC  Antibiotics   Anti-infectives (From admission, onward)   Start     Dose/Rate Route Frequency Ordered Stop   06/23/19 2000  vancomycin (VANCOCIN) IVPB 1000 mg/200 mL premix     1,000 mg 200 mL/hr over 60 Minutes Intravenous Every 24 hours 06/23/19 1522     06/23/19 1800  vancomycin (VANCOREADY) IVPB 750 mg/150 mL  Status:  Discontinued     750 mg 150 mL/hr over 60 Minutes Intravenous Every 24 hours 06/22/19 1757 06/23/19 1522   06/23/19 1000  ceFEPIme (MAXIPIME) 2 g in sodium chloride 0.9 % 100 mL IVPB     2 g 200 mL/hr over 30 Minutes Intravenous Every 12 hours 06/22/19 2044     06/22/19 1900  ceFEPIme (MAXIPIME) 2 g in sodium chloride 0.9 % 100 mL IVPB     2 g 200 mL/hr over 30 Minutes Intravenous  Once 06/22/19 1851 06/22/19 2249   06/22/19 1800  vancomycin (VANCOREADY) IVPB 2000 mg/400 mL     2,000 mg 200 mL/hr over 120 Minutes Intravenous  Once 06/22/19 1757 06/22/19 2242   06/22/19 1730  vancomycin (VANCOREADY) IVPB 1500 mg/300 mL  Status:  Discontinued     1,500 mg 150 mL/hr over 120 Minutes Intravenous  Once 06/22/19 1723 06/22/19 1757   06/22/19 1645  piperacillin-tazobactam (ZOSYN) IVPB 3.375 g     3.375 g 100 mL/hr over 30 Minutes Intravenous  Once 06/22/19 1634 06/22/19 1752  Subjective:   Gregory [name] seen and examined today.  Does not feel his pain is adequately being treated. States "this is inhumane".  Continues to have pain in the right stump. Would like valium, xanax, and pain medicine.   Objective:   Vitals:   06/24/19 1000 06/24/19 1015 06/24/19 1031  06/24/19 1235  BP:  (!) 158/83 (!) 175/91 (!) 143/93  Pulse:  78 (!) 117 64  Resp:  14  15  Temp: 98.3 F (36.8 C) 98.3 F (36.8 C) 98.2 F (36.8 C) 97.9 F (36.6 C)  TempSrc:   Oral Oral  SpO2:  99% 95% 99%  Weight:      Height:        Intake/Output Summary (Last 24 hours) at 06/24/2019 1332 Last data filed at 06/24/2019 0912 Gross per 24 hour  Intake 1400 ml  Output 1845 ml  Net -445 ml   Filed Weights   06/22/19 1035 06/22/19 1950  Weight: 99.8 kg 112.3 kg   Exam  General: Well developed, ill-appearing, NAD  HEENT: NCAT, mucous membranes moist.   Cardiovascular: S1 S2 auscultated, RRR  Respiratory: Clear to auscultation bilaterally   Abdomen: Soft, obese, nontender, nondistended, + bowel sounds  Extremities: R AKA, dressing and vac in place  Neuro: AAOx3, nonfocal  Psych: Appropriate mood and affect  Data Reviewed: I have personally reviewed following labs and imaging studies  CBC: Recent Labs  Lab 06/22/19 1037 06/24/19 1104  WBC 10.6* 10.0  NEUTROABS 7.6  --   HGB 9.4* 9.0*  HCT 32.2* 30.4*  MCV 89.2 85.9  PLT 308 0000000   Basic Metabolic Panel: Recent Labs  Lab 06/22/19 1037 06/24/19 1104  NA 138 136  K 4.3 4.5  CL 101 101  CO2 27 22  GLUCOSE 147* 269*  BUN 26* 22*  CREATININE 1.90* 1.55*  CALCIUM 8.9 8.8*   GFR: Estimated Creatinine Clearance: 65.6 mL/min (A) (by C-G formula based on SCr of 1.55 mg/dL (H)). Liver Function Tests: Recent Labs  Lab 06/22/19 1037  AST 10*  ALT 11  ALKPHOS 90  BILITOT 0.4  PROT 7.2  ALBUMIN 2.6*   No results for input(s): LIPASE, AMYLASE in the last 168 hours. No results for input(s): AMMONIA in the last 168 hours. Coagulation Profile: Recent Labs  Lab 06/22/19 1037  INR 1.0   Cardiac Enzymes: No results for input(s): CKTOTAL, CKMB, CKMBINDEX, TROPONINI in the last 168 hours. BNP (last 3 results) No results for input(s): PROBNP in the last 8760 hours. HbA1C: No results for input(s): HGBA1C  in the last 72 hours. CBG: Recent Labs  Lab 06/23/19 2132 06/24/19 0654 06/24/19 0926 06/24/19 1006 06/24/19 1131  GLUCAP 224* 213* 234* 241* 273*   Lipid Profile: No results for input(s): CHOL, HDL, LDLCALC, TRIG, CHOLHDL, LDLDIRECT in the last 72 hours. Thyroid Function Tests: No results for input(s): TSH, T4TOTAL, FREET4, T3FREE, THYROIDAB in the last 72 hours. Anemia Panel: No results for input(s): VITAMINB12, FOLATE, FERRITIN, TIBC, IRON, RETICCTPCT in the last 72 hours. Urine analysis:    Component Value Date/Time   COLORURINE YELLOW 06/22/2019 2053   APPEARANCEUR CLEAR 06/22/2019 2053   LABSPEC 1.014 06/22/2019 2053   PHURINE 5.0 06/22/2019 2053   GLUCOSEU NEGATIVE 06/22/2019 2053   HGBUR NEGATIVE 06/22/2019 2053   [place] NEGATIVE 06/22/2019 2053   [place] NEGATIVE 06/22/2019 2053   PROTEINUR 30 (A) 06/22/2019 2053   UROBILINOGEN 1.0 02/03/2014 0052   NITRITE NEGATIVE 06/22/2019 2053   LEUKOCYTESUR NEGATIVE 06/22/2019 2053  Sepsis Labs: @LABRCNTIP (procalcitonin:4,lacticidven:4)  ) Recent Results (from the past 240 hour(s))  Culture, blood (single)     Status: None (Preliminary result)   Collection Time: 06/22/19 11:04 AM   Specimen: BLOOD RIGHT ARM  Result Value Ref Range Status   Specimen Description BLOOD RIGHT ARM  Final   Special Requests   Final    BOTTLES DRAWN AEROBIC AND ANAEROBIC Blood Culture adequate volume   Culture   Final    NO GROWTH 1 DAY Performed at [facility], [address], [place], [place] [address]    Report Status PENDING  Incomplete  Wound or Superficial Culture     Status: None (Preliminary result)   Collection Time: 06/22/19  4:40 PM   Specimen: Wound  Result Value Ref Range Status   Specimen Description WOUND RIGHT LEG  Final   Special Requests STUMP SITE  Final   Gram Stain   Final    FEW WBC PRESENT, PREDOMINANTLY PMN ABUNDANT GRAM POSITIVE COCCI MODERATE GRAM NEGATIVE RODS Performed at [facility], [place] [address] [place], [place] [address]    Culture   Final    Emmit [place] NEGATIVE RODS MODERATE STAPHYLOCOCCUS AUREUS    Report Status PENDING  Incomplete  SARS CORONAVIRUS 2 (TAT 6-24 HRS) Nasopharyngeal Nasopharyngeal Swab     Status: None   Collection Time: 06/22/19  7:58 PM   Specimen: Nasopharyngeal Swab  Result Value Ref Range Status   SARS Coronavirus 2 NEGATIVE NEGATIVE Final    Comment: (NOTE) SARS-CoV-2 target nucleic acids are NOT DETECTED. The SARS-CoV-2 RNA is generally detectable in upper and lower respiratory specimens during the acute phase of infection. Negative results do not preclude SARS-CoV-2 infection, do not rule out co-infections with other pathogens, and should not be used as the sole basis for treatment or other patient management decisions. Negative results must be combined with clinical observations, patient history, and epidemiological information. The expected result is Negative. Fact Sheet for Patients: SugarRoll.be Fact Sheet for Healthcare Providers: https://www.woods-mathews.com/ This test is not yet approved or cleared by the [place] FDA and  has been authorized for detection and/or diagnosis of SARS-CoV-2 by FDA under an Emergency Use Authorization (EUA). This EUA will remain  in effect (meaning this test can be used) for the duration of the COVID-19 declaration under Section 56 4(b)(1) of the Act, 21 U.S.C. section 360bbb-3(b)(1), unless the authorization is terminated or revoked sooner. Performed at [facility], [place] [address] [place], [place] [address]       Radiology Studies: No results found.   Scheduled Meds: . amLODipine  10 mg Oral Daily  . atorvastatin  20 mg Oral QHS  . buPROPion  300 mg Oral QHS  . docusate sodium  100 mg Oral BID  . fentaNYL      . ferrous sulfate  325 mg Oral Q breakfast  . finasteride  5 mg Oral Daily  . fluticasone  1 spray Each  Nare Daily  . furosemide  40 mg Oral Daily  . heparin  5,000 Units Subcutaneous Q8H  . insulin aspart  0-15 Units Subcutaneous TID WC  . loratadine  10 mg Oral Daily  . magnesium oxide  400 mg Oral BID  . metoprolol tartrate  50 mg Oral Daily  . mirabegron ER  50 mg Oral Daily  . nicotine  14 mg Transdermal QHS  . ondansetron      . oxybutynin  15 mg Oral QHS  .  pantoprazole  40 mg Oral Daily  . PARoxetine  10 mg Oral QHS  . pregabalin  150 mg Oral BID  . senna-docusate  2 tablet Oral QHS  . tamsulosin  0.8 mg Oral QPM  . umeclidinium bromide  1 puff Inhalation Daily  . vitamin B-12  1,000 mcg Oral Daily   Continuous Infusions: . ceFEPime (MAXIPIME) IV 2 g (06/23/19 2217)  . vancomycin 1,000 mg (06/23/19 2104)     LOS: 2 days   Time Spent in minutes   45 minutes  Kort Stettler D.O. on 06/24/2019 at 1:32 PM  Between 7am to 7pm - Please see pager noted on [email or web address]  After 7pm go to [email or web address]  And look for the night coverage person covering for me after hours  [name] Office  [phone]

## 2019-06-24 NOTE — Interval H&P Note (Signed)
History and Physical Interval Note:  06/24/2019 6:54 AM  Gregory [name]  has presented today for surgery, with the diagnosis of right dehiscence below knee amputation.  The various methods of treatment have been discussed with the patient and family. After consideration of risks, benefits and other options for treatment, the patient has consented to  Procedure(s): STUMP REVISION (Right) as a surgical intervention.  The patient's history has been reviewed, patient examined, no change in status, stable for surgery.  I have reviewed the patient's chart and labs.  Questions were answered to the patient's satisfaction.     [name]

## 2019-06-24 NOTE — Anesthesia Postprocedure Evaluation (Signed)
Anesthesia Post Note  Patient: Gregory [name]  Procedure(s) Performed: STUMP REVISION (Right Leg Upper)     Patient location during evaluation: PACU Anesthesia Type: General Level of consciousness: awake and alert Pain management: pain level controlled Vital Signs Assessment: post-procedure vital signs reviewed and stable Respiratory status: spontaneous breathing, nonlabored ventilation and respiratory function stable Cardiovascular status: blood pressure returned to baseline and stable Postop Assessment: no apparent nausea or vomiting Anesthetic complications: no    Last Vitals:  Vitals:   06/24/19 1235 06/24/19 1441  BP: (!) 143/93 127/72  Pulse: 64 (!) 57  Resp: 15   Temp: 36.6 C 36.7 C  SpO2: 99% 98%    Last Pain:  Vitals:   06/24/19 1307  TempSrc:   PainSc: 10-Worst pain ever                 Lidia Collum

## 2019-06-25 LAB — BASIC METABOLIC PANEL
Anion gap: 13 (ref 5–15)
BUN: 24 mg/dL — ABNORMAL HIGH (ref 6–20)
CO2: 23 mmol/L (ref 22–32)
Calcium: 9 mg/dL (ref 8.9–10.3)
Chloride: 100 mmol/L (ref 98–111)
Creatinine, Ser: 1.36 mg/dL — ABNORMAL HIGH (ref 0.61–1.24)
GFR calc Af Amer: >60 mL/min (ref >60)
GFR calc non Af Amer: 56 mL/min — ABNORMAL LOW (ref >60)
Glucose, Bld: 249 mg/dL — ABNORMAL HIGH (ref 70–99)
Potassium: 4 mmol/L (ref 3.5–5.1)
Sodium: 136 mmol/L (ref 135–145)

## 2019-06-25 LAB — GLUCOSE, CAPILLARY
Glucose-Capillary: 208 mg/dL — ABNORMAL HIGH (ref 70–99)
Glucose-Capillary: 249 mg/dL — ABNORMAL HIGH (ref 70–99)
Glucose-Capillary: 269 mg/dL — ABNORMAL HIGH (ref 70–99)
Glucose-Capillary: 211 mg/dL — ABNORMAL HIGH (ref 70–99)

## 2019-06-25 LAB — HEMOGLOBIN AND HEMATOCRIT, BLOOD
HCT: 29.6 % — ABNORMAL LOW (ref 39.0–52.0)
Hemoglobin: 8.9 g/dL — ABNORMAL LOW (ref 13.0–17.0)

## 2019-06-25 LAB — AEROBIC CULTURE W GRAM STAIN (SUPERFICIAL SPECIMEN)

## 2019-06-25 LAB — SARS CORONAVIRUS 2 (TAT 6-24 HRS): SARS Coronavirus 2: NEGATIVE

## 2019-06-25 MED ORDER — ALPRAZOLAM 0.25 MG PO TABS
0.2500 mg | ORAL_TABLET | Freq: Two times a day (BID) | ORAL | Status: DC | PRN
  Administered 2019-06-25 – 2019-06-27 (×5): 0.25 mg via ORAL
  Filled 2019-06-25 (×5): qty 1

## 2019-06-25 MED ORDER — VANCOMYCIN HCL 1500 MG/300ML IV SOLN
1500.0000 mg | INTRAVENOUS | Status: DC
  Administered 2019-06-25: 1500 mg via INTRAVENOUS
  Filled 2019-06-25 (×2): qty 300

## 2019-06-25 MED ORDER — MORPHINE SULFATE (PF) 2 MG/ML IV SOLN
2.0000 mg | INTRAVENOUS | Status: DC | PRN
  Administered 2019-06-25 – 2019-06-27 (×11): 2 mg via INTRAVENOUS
  Filled 2019-06-25 (×11): qty 1

## 2019-06-25 MED ORDER — FAMOTIDINE 20 MG PO TABS
20.0000 mg | ORAL_TABLET | Freq: Two times a day (BID) | ORAL | Status: DC
  Administered 2019-06-25 – 2019-06-27 (×5): 20 mg via ORAL
  Filled 2019-06-25 (×5): qty 1

## 2019-06-25 MED ORDER — ALUM & MAG HYDROXIDE-SIMETH 200-200-20 MG/5ML PO SUSP
15.0000 mL | ORAL | Status: DC | PRN
  Administered 2019-06-25 (×2): 15 mL via ORAL
  Filled 2019-06-25 (×2): qty 30

## 2019-06-25 NOTE — Progress Notes (Signed)
Inpatient Diabetes Program Recommendations  AACE/ADA: New Consensus Statement on Inpatient Glycemic Control (2015)  Target Ranges:  Prepandial:   less than 140 mg/dL      Peak postprandial:   less than 180 mg/dL (1-2 hours)      Critically ill patients:  140 - 180 mg/dL   Lab Results  Component Value Date   GLUCAP 249 (H) 06/25/2019   HGBA1C 8.6 (H) 05/23/2019    Review of Glycemic Control Results for Gregory [name], Gregory [name] (MRN [id number]) as of 06/25/2019 16:13  Ref. Range 06/24/2019 16:26 06/24/2019 21:34 06/25/2019 08:00 06/25/2019 12:00  Glucose-Capillary Latest Ref Range: 70 - 99 mg/dL 224 (H) 265 (H) 208 (H) 249 (H)   Diabetes history: Type 2 DM Outpatient Diabetes medications: Glipizide 10 mg QAM, Humalog 16 units TID Current orders for Inpatient glycemic control: Novolog 0-15 units TID  Inpatient Diabetes Program Recommendations:    If trends continue above inpatient goals consider adding Lantus 10 units QHS.   Thanks, Bronson Curb, MSN, RNC-OB Diabetes Coordinator [phone] (8a-5p)

## 2019-06-25 NOTE — Progress Notes (Signed)
Pharmacy Antibiotic Note  Gregory [name] is a 59 y.o. male admitted on 06/22/2019 with R AKA stump infection.  Pharmacy has been consulted for Vancomycin dosing.      Vanc dosed 1/4 on 68" and 99.8 kg, then ht listed as 72" and weight 112.3 kg on 1/5. Confirmed ht/wt with patient and Vanc regimen adjusted.    s/p R AKA revision 1/6.  Scr has improved 1.90 >> 1.36, likely  baseline.   Wound culture grew E coli, MRSA and Group B Strep.  Plan: Adjust Vancomycin from 1250 mg >1500 mg IV q24hrs to begin tonight. Goal AUC 400-550. Expected AUC: 469 SCr used: 1.36 Continue Cefepime 2gm IV q12 > q8hrs. Will follow renal function, clinical progress and antibiotic plans. Vanc levels at steady state.   Height: 6' (182.9 cm) Weight: 247 lb 9.2 oz (112.3 kg) IBW/kg (Calculated) : 77.6  Temp (24hrs), Avg:98.3 F (36.8 C), Min:98.1 F (36.7 C), Max:98.5 F (36.9 C)  Recent Labs  Lab 06/22/19 1037 06/24/19 1104 06/25/19 0621  WBC 10.6* 10.0  --   CREATININE 1.90* 1.55* 1.36*  LATICACIDVEN 0.9  --   --     Estimated Creatinine Clearance: 74.8 mL/min (A) (by C-G formula based on SCr of 1.36 mg/dL (H)).    Allergies  Allergen Reactions  . Latex Other (See Comments)    Listed on MAR- pt sts he is not allergic.    Antimicrobials this admission: Vancomycin 1/4 >> Cefepime 1/4>>  Dose adjustments this admission:  1/5:  New height/weight > maintenance dose adjusted before 1st dose given  1/6: Scr improved > adjusted Vanc 1gm > 1250 mg IV q24hrs and                                  Cefepime 2 gm IV q12 > q8hrs  1/7: Scr improved > incr Vanc to 1500 mg IV q24hrs  Microbiology results: 1/4 Wound Cx ((R leg stump): several organisms    abundant E coli - sens Cefepime, Cefazolin; R to Amp, Cipro, Septra    moderate MRSA - MIC to Vanc 1; R Cipro and Septra    moderate Group B Strep 1/4 Blood x 1: no growth x 3 days to date 1/4 COVID: negative  Thank you for allowing pharmacy to be a part of  this patient's care.  Arty Baumgartner, [place] Phone: [phone] 06/25/2019 3:33 PM

## 2019-06-25 NOTE — Progress Notes (Signed)
OT Cancellation Note  Patient Details Name: Gregory [name] MRN: [id number] DOB: [birth year]   Cancelled Treatment:     Attempted to see patient x2 this morning. First attempt patient waiting for pain medication. Second attempt patient reports still in too much pain. Will re-attempt as schedule allows.  [place] OT office: [place] 06/25/2019, 12:09 PM

## 2019-06-25 NOTE — Progress Notes (Signed)
POD1 revision AKA. 0 cc in VAC. From an orthopedic standpoint patient can return to SNF.  follow up with [name] 1 week.  He says he cant imagine doing that as he feels his pain wont be controlled there. I explained to him that his pain management will be difficult because he has been on chronic large doses of narcotics. I also reminded him of the importance of not touching or attempting to remove his wound vac. He understands.

## 2019-06-25 NOTE — Progress Notes (Addendum)
PROGRESS NOTE    Gregory [name]  [id number] DOB: Oct 17, 1960 DOA: 06/22/2019 PCP: [name], MD   Brief Narrative:  HPI on 06/22/19 by Dr. Vita Erm is a 59 y.o. 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  He denies any fever chills.  Recently patient underwent revision of the right above-the-knee amputation, he was discharged home with 1 week of antibiotics on December 13.  Interim history Admitted with right lower extremity stump infection/cellulitis.  Orthopedic surgery consulted, status post revision right above-the-knee amputation and application of Prevena customizable and water form wound VAC.   Assessment & Plan   Right lower extremity/stump infection/cellulitis -Of note, Patient has had multiple positive MRSA screenings in the past -Started on vancomycin and cefepime -Orthopedic surgery consulted and appreciated, status post revision of right AKA and application of wound VAC -Continue pain control, have changed oxycodone 20mg  q4hr PRN.   -Blood culture showed no growth to date -continue zanaflex -Patient requesting Dilaudid to be switched to morphine and would like to have an anxiolytic.  Chronic kidney disease, stage IIIb -On review of patient's chart, his GFR has been in stage IIIb range since 2019 -Creatinine stable, currently 1.36 -Continue to monitor BMP  Diabetes mellitus, type II -Hemoglobin A1c 8.6 -Continue insulin sliding scale with CBG monitoring  Essential hypertension -Continue amlodipine, Lasix, metoprolol  Chronic diastolic heart failure -Appears to be euvolemic and compensated -Continue  Lasix, intake and output, daily weights  Anemia of chronic disease -hemoglobin stable, currently 8.9, continue monitor CBC  Depression/anxiety -Continue Paxil, Wellbutrin -Will add on low-dose Xanax, but ultimately patient will need to follow-up with his PCP continuation of this  Indigestion -Have ordered Pepcid as well as Maalox  DVT Prophylaxis Heparin  Code Status: Full  Family Communication: None at bedside  Disposition Plan: Admitted.  Suspect patient will return to SNF in 1 to 2 days. Pending repeat COVID testing  Consultants Orthopedic surgery  Procedures  Revision of right AKA and application of Prevena customizable water form wound VAC  Antibiotics   Anti-infectives (From admission, onward)   Start     Dose/Rate Route Frequency Ordered Stop   06/24/19 2000  vancomycin (VANCOREADY) IVPB 1250 mg/250 mL     1,250 mg 166.7 mL/hr over 90 Minutes Intravenous Every 24 hours 06/24/19 1520     06/24/19 1600  ceFEPIme (MAXIPIME) 2 g in sodium chloride 0.9 % 100 mL IVPB     2 g 200 mL/hr over 30 Minutes Intravenous Every 8 hours 06/24/19 1522     06/23/19 2000  vancomycin (VANCOCIN) IVPB 1000 mg/200 mL premix  Status:  Discontinued     1,000 mg 200 mL/hr over 60 Minutes Intravenous Every 24 hours 06/23/19 1522 06/24/19 1520   06/23/19 1800  vancomycin (VANCOREADY) IVPB 750 mg/150 mL  Status:  Discontinued     750 mg 150 mL/hr over 60 Minutes Intravenous Every 24 hours 06/22/19 1757 06/23/19 1522   06/23/19 1000  ceFEPIme (MAXIPIME) 2 g in sodium chloride 0.9 % 100 mL IVPB  Status:  Discontinued     2 g 200 mL/hr over 30 Minutes Intravenous Every 12 hours 06/22/19 2044 06/24/19 1522   06/22/19 1900  ceFEPIme (  MAXIPIME) 2 g in sodium chloride 0.9 % 100 mL IVPB     2 g 200 mL/hr over 30 Minutes Intravenous  Once 06/22/19 1851 06/22/19 2249   06/22/19 1800  vancomycin (VANCOREADY) IVPB 2000 mg/400 mL     2,000 mg 200 mL/hr over 120 Minutes Intravenous  Once 06/22/19 1757  06/22/19 2242   06/22/19 1730  vancomycin (VANCOREADY) IVPB 1500 mg/300 mL  Status:  Discontinued     1,500 mg 150 mL/hr over 120 Minutes Intravenous  Once 06/22/19 1723 06/22/19 1757   06/22/19 1645  piperacillin-tazobactam (ZOSYN) IVPB 3.375 g     3.375 g 100 mL/hr over 30 Minutes Intravenous  Once 06/22/19 1634 06/22/19 1752      Subjective:   Gregory [name] seen and examined today.  Patient does not feel his pain is adequately controlled, would like to have morphine instead of Dilaudid.  Also asking for Xanax.  Denies current chest pain shortness of breath, abdominal pain.  Does complain of indigestion.  Denies diarrhea, constipation, dizziness or headache.  Objective:   Vitals:   06/24/19 1650 06/24/19 2137 06/25/19 0205 06/25/19 0601  BP: (!) 143/77 (!) 144/70 (!) 148/76 135/70  Pulse: 63 (!) 59 69 60  Resp:  18 15 14   Temp: 98.5 F (36.9 C) 98.5 F (36.9 C) 98.1 F (36.7 C) 98.2 F (36.8 C)  TempSrc:  Oral Oral Oral  SpO2: 98% 98% 97% 96%  Weight:      Height:        Intake/Output Summary (Last 24 hours) at 06/25/2019 1056 Last data filed at 06/25/2019 [id number] Gross per 24 hour  Intake 840 ml  Output 1275 ml  Net -435 ml   Filed Weights   06/22/19 1035 06/22/19 1950  Weight: 99.8 kg 112.3 kg   Exam   General: Well developed, chronically ill-appearing, NAD  HEENT: NCAT, mucous membranes moist.   Cardiovascular: S1 S2 auscultated, RRR  Respiratory: Clear to auscultation bilaterally with equal chest rise  Abdomen: Soft, pes, nontender, nondistended, + bowel sounds  Extremities: Right AKA with wound VAC in place  Neuro: AAOx3, nonfocal  Psych: Anxious  Data Reviewed: I have personally reviewed following labs and imaging studies  CBC: Recent Labs  Lab 06/22/19 1037 06/24/19 1104 06/25/19 0621  WBC 10.6* 10.0  --   NEUTROABS 7.6  --   --   HGB 9.4* 9.0* 8.9*  HCT 32.2* 30.4* 29.6*  MCV 89.2 85.9  --   PLT 308 293  --    Basic Metabolic Panel: Recent  Labs  Lab 06/22/19 1037 06/24/19 1104 06/25/19 0621  NA 138 136 136  K 4.3 4.5 4.0  CL 101 101 100  CO2 27 22 23   GLUCOSE 147* 269* 249*  BUN 26* 22* 24*  CREATININE 1.90* 1.55* 1.36*  CALCIUM 8.9 8.8* 9.0   GFR: Estimated Creatinine Clearance: 74.8 mL/min (A) (by C-G formula based on SCr of 1.36 mg/dL (H)). Liver Function Tests: Recent Labs  Lab 06/22/19 1037  AST 10*  ALT 11  ALKPHOS 90  BILITOT 0.4  PROT 7.2  ALBUMIN 2.6*   No results for input(s): LIPASE, AMYLASE in the last 168 hours. No results for input(s): AMMONIA in the last 168 hours. Coagulation Profile: Recent Labs  Lab 06/22/19 1037  INR 1.0   Cardiac Enzymes: No results for input(s): CKTOTAL, CKMB, CKMBINDEX, TROPONINI in the last 168 hours. BNP (last 3 results) No results for input(s): PROBNP in the last 8760 hours. HbA1C: No results  for input(s): HGBA1C in the last 72 hours. CBG: Recent Labs  Lab 06/24/19 1006 06/24/19 1131 06/24/19 1626 06/24/19 2134 06/25/19 0800  GLUCAP 241* 273* 224* 265* 208*   Lipid Profile: No results for input(s): CHOL, HDL, LDLCALC, TRIG, CHOLHDL, LDLDIRECT in the last 72 hours. Thyroid Function Tests: No results for input(s): TSH, T4TOTAL, FREET4, T3FREE, THYROIDAB in the last 72 hours. Anemia Panel: No results for input(s): VITAMINB12, FOLATE, FERRITIN, TIBC, IRON, RETICCTPCT in the last 72 hours. Urine analysis:    Component Value Date/Time   COLORURINE YELLOW 06/22/2019 2053   APPEARANCEUR CLEAR 06/22/2019 2053   LABSPEC 1.014 06/22/2019 2053   PHURINE 5.0 06/22/2019 2053   GLUCOSEU NEGATIVE 06/22/2019 2053   HGBUR NEGATIVE 06/22/2019 2053   [place] NEGATIVE 06/22/2019 2053   [place] NEGATIVE 06/22/2019 2053   PROTEINUR 30 (A) 06/22/2019 2053   UROBILINOGEN 1.0 02/03/2014 0052   NITRITE NEGATIVE 06/22/2019 2053   LEUKOCYTESUR NEGATIVE 06/22/2019 2053   Sepsis Labs: @LABRCNTIP (procalcitonin:4,lacticidven:4)  ) Recent Results (from the past  240 hour(s))  Culture, blood (single)     Status: None (Preliminary result)   Collection Time: 06/22/19 11:04 AM   Specimen: BLOOD RIGHT ARM  Result Value Ref Range Status   Specimen Description BLOOD RIGHT ARM  Final   Special Requests   Final    BOTTLES DRAWN AEROBIC AND ANAEROBIC Blood Culture adequate volume   Culture   Final    NO GROWTH 2 DAYS Performed at [facility], [place] [address] [place], [place] [address]    Report Status PENDING  Incomplete  Wound or Superficial Culture     Status: None (Preliminary result)   Collection Time: 06/22/19  4:40 PM   Specimen: Wound  Result Value Ref Range Status   Specimen Description WOUND RIGHT LEG  Final   Special Requests STUMP SITE  Final   Gram Stain   Final    FEW WBC PRESENT, PREDOMINANTLY PMN ABUNDANT GRAM POSITIVE COCCI MODERATE GRAM NEGATIVE RODS Performed at [facility], [place] [address] [place], [place] [address]    Culture   Final    ABUNDANT ESCHERICHIA COLI MODERATE METHICILLIN RESISTANT STAPHYLOCOCCUS AUREUS    Report Status PENDING  Incomplete   Organism ID, Bacteria ESCHERICHIA COLI  Final   Organism ID, Bacteria METHICILLIN RESISTANT STAPHYLOCOCCUS AUREUS  Final      Susceptibility   Escherichia coli - MIC*    AMPICILLIN >=32 RESISTANT Resistant     CEFAZOLIN <=4 SENSITIVE Sensitive     CEFEPIME <=0.12 SENSITIVE Sensitive     CEFTAZIDIME <=1 SENSITIVE Sensitive     CEFTRIAXONE <=0.25 SENSITIVE Sensitive     CIPROFLOXACIN >=4 RESISTANT Resistant     GENTAMICIN <=1 SENSITIVE Sensitive     IMIPENEM <=0.25 SENSITIVE Sensitive     TRIMETH/SULFA >=320 RESISTANT Resistant     AMPICILLIN/SULBACTAM 16 INTERMEDIATE Intermediate     PIP/TAZO <=4 SENSITIVE Sensitive     * ABUNDANT ESCHERICHIA COLI   Methicillin resistant staphylococcus aureus - MIC*    CIPROFLOXACIN >=8 RESISTANT Resistant     ERYTHROMYCIN >=8 RESISTANT Resistant     GENTAMICIN <=0.5 SENSITIVE Sensitive     OXACILLIN >=4 RESISTANT  Resistant     TETRACYCLINE >=16 RESISTANT Resistant     VANCOMYCIN 1 SENSITIVE Sensitive     TRIMETH/SULFA >=320 RESISTANT Resistant     CLINDAMYCIN <=0.25 SENSITIVE Sensitive     RIFAMPIN <=0.5 SENSITIVE Sensitive     Inducible Clindamycin NEGATIVE Sensitive     *  MODERATE METHICILLIN RESISTANT STAPHYLOCOCCUS AUREUS  SARS CORONAVIRUS 2 (TAT 6-24 HRS) Nasopharyngeal Nasopharyngeal Swab     Status: None   Collection Time: 06/22/19  7:58 PM   Specimen: Nasopharyngeal Swab  Result Value Ref Range Status   SARS Coronavirus 2 NEGATIVE NEGATIVE Final    Comment: (NOTE) SARS-CoV-2 target nucleic acids are NOT DETECTED. The SARS-CoV-2 RNA is generally detectable in upper and lower respiratory specimens during the acute phase of infection. Negative results do not preclude SARS-CoV-2 infection, do not rule out co-infections with other pathogens, and should not be used as the sole basis for treatment or other patient management decisions. Negative results must be combined with clinical observations, patient history, and epidemiological information. The expected result is Negative. Fact Sheet for Patients: SugarRoll.be Fact Sheet for Healthcare Providers: https://www.woods-mathews.com/ This test is not yet approved or cleared by the [place] FDA and  has been authorized for detection and/or diagnosis of SARS-CoV-2 by FDA under an Emergency Use Authorization (EUA). This EUA will remain  in effect (meaning this test can be used) for the duration of the COVID-19 declaration under Section 56 4(b)(1) of the Act, 21 U.S.C. section 360bbb-3(b)(1), unless the authorization is terminated or revoked sooner. Performed at [facility], [place] [address], [place],  [address]       Radiology Studies: No results found.   Scheduled Meds: . amLODipine  10 mg Oral Daily  . atorvastatin  20 mg Oral QHS  . buPROPion  300 mg Oral QHS  . docusate  sodium  100 mg Oral BID  . famotidine  20 mg Oral BID  . ferrous sulfate  325 mg Oral Q breakfast  . finasteride  5 mg Oral Daily  . fluticasone  1 spray Each Nare Daily  . furosemide  40 mg Oral Daily  . heparin  5,000 Units Subcutaneous Q8H  . insulin aspart  0-15 Units Subcutaneous TID WC  . loratadine  10 mg Oral Daily  . magnesium oxide  400 mg Oral BID  . metoprolol tartrate  50 mg Oral Daily  . mirabegron ER  50 mg Oral Daily  . nicotine  14 mg Transdermal QHS  . oxybutynin  15 mg Oral QHS  . pantoprazole  40 mg Oral Daily  . PARoxetine  10 mg Oral QHS  . pregabalin  150 mg Oral BID  . senna-docusate  2 tablet Oral QHS  . tamsulosin  0.8 mg Oral QPM  . umeclidinium bromide  1 puff Inhalation Daily  . vitamin B-12  1,000 mcg Oral Daily   Continuous Infusions: . ceFEPime (MAXIPIME) IV 2 g (06/25/19 0732)  . vancomycin Stopped (06/25/19 0644)     LOS: 3 days   Time Spent in minutes   45 minutes  Maylee Bare D.O. on 06/25/2019 at 10:56 AM  Between 7am to 7pm - Please see pager noted on [email or web address]  After 7pm go to [email or web address]  And look for the night coverage person covering for me after hours  [name] Office  [phone]

## 2019-06-25 NOTE — Progress Notes (Signed)
PT Cancellation Note  Patient Details Name: Gregory [name] MRN: [id number] DOB: [birth year]   Cancelled Treatment:    Reason Eval/Treat Not Completed: Patient declined, no reason specified  Attempted x 2 but pt refused PT.  Educated on importance of mobility in recovery and prevention of PNE, blood clots, and bed sore, but pt becoming somewhat agitated and firmly stated "Not Now."  Returned 2 hours later and he again refused.  Additionally pt laying on edge of bed with rail both rails down and bed height elevated.  Tried to put rail up and lower bed and he adamantly refused - discussed fall risk but still refused.   Maggie Font, PT Acute Rehab Services Pager [phone] [facility] [place] 06/25/2019, 1:37 PM

## 2019-06-26 LAB — GLUCOSE, CAPILLARY
Glucose-Capillary: 210 mg/dL — ABNORMAL HIGH (ref 70–99)
Glucose-Capillary: 243 mg/dL — ABNORMAL HIGH (ref 70–99)
Glucose-Capillary: 184 mg/dL — ABNORMAL HIGH (ref 70–99)
Glucose-Capillary: 271 mg/dL — ABNORMAL HIGH (ref 70–99)

## 2019-06-26 MED ORDER — CEPHALEXIN 500 MG PO CAPS
500.0000 mg | ORAL_CAPSULE | Freq: Three times a day (TID) | ORAL | Status: DC
  Administered 2019-06-26 – 2019-06-27 (×4): 500 mg via ORAL
  Filled 2019-06-26 (×4): qty 1

## 2019-06-26 MED ORDER — OXYCODONE HCL 5 MG PO TABS: 5.0000 mg | ORAL_TABLET | Freq: Four times a day (QID) | ORAL | 0 refills | Status: DC | PRN

## 2019-06-26 MED ORDER — OXYCODONE-ACETAMINOPHEN 5-325 MG PO TABS: 1.0000 | ORAL_TABLET | Freq: Four times a day (QID) | ORAL | 0 refills | Status: DC | PRN

## 2019-06-26 MED ORDER — CLINDAMYCIN HCL 300 MG PO CAPS
300.0000 mg | ORAL_CAPSULE | Freq: Three times a day (TID) | ORAL | Status: DC
  Administered 2019-06-26 – 2019-06-27 (×4): 300 mg via ORAL
  Filled 2019-06-26 (×4): qty 1

## 2019-06-26 MED ORDER — OXYCODONE HCL 5 MG PO TABS: 5.0000 mg | ORAL_TABLET | ORAL | 0 refills | Status: DC | PRN

## 2019-06-26 MED ORDER — OXYCODONE HCL 20 MG PO TABS: 20.0000 mg | ORAL_TABLET | ORAL | 0 refills | Status: DC | PRN

## 2019-06-26 MED ORDER — NICOTINE 14 MG/24HR TD PT24: 14.0000 mg | MEDICATED_PATCH | Freq: Every day | TRANSDERMAL | 0 refills | Status: DC

## 2019-06-26 NOTE — Progress Notes (Signed)
PROGRESS NOTE    Gregory [name]  [name] DOB: [birth year] DOA: 06/22/2019 PCP: [name], MD   Brief Narrative:  HPI on 06/22/19 by Dr. Vita Erm is a 59 y.o. 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  He denies any fever chills.  Recently patient underwent revision of the right above-the-knee amputation, he was discharged home with 1 week of antibiotics on December 13.  Interim history Admitted with right lower extremity stump infection/cellulitis.  Orthopedic surgery consulted, status post revision right above-the-knee amputation and application of Prevena customizable and water form wound VAC.   Assessment & Plan   Right lower extremity/stump infection/cellulitis -Of note, Patient has had multiple positive MRSA screenings in the past -Started on vancomycin and cefepime -Orthopedic surgery consulted and appreciated, status post revision of right AKA and application of wound VAC -Continue pain control, have changed oxycodone 20mg  q4hr PRN.   -Blood culture showed no growth to date -Wound culture: Showed abundant E. coli resistant to Cipro, Bactrim, ampicillin as well as Augmentin.  Also showed MRSA fairly resistant. -continue zanaflex -Patient requesting Dilaudid to be switched to morphine and would like to have an anxiolytic. -Have placed patient on Keflex as well as clindamycin  Chronic kidney disease, stage IIIb -On review of patient's chart, his GFR has been in stage IIIb range since 2019 -Creatinine stable, currently 1.36  Diabetes mellitus, type II -Hemoglobin A1c 8.6 -Continue insulin sliding scale with CBG  monitoring  Essential hypertension -Continue amlodipine, Lasix, metoprolol  Chronic diastolic heart failure -Appears to be euvolemic and compensated -Continue Lasix, intake and output, daily weights  Anemia of chronic disease -hemoglobin was 8.9 on 06/25/19- stable -Refused labs this morning  Depression/anxiety -Continue Paxil, Wellbutrin -Added on low-dose Xanax, but ultimately patient will need to follow-up with his PCP continuation of this  Indigestion -Continue Pepcid as well as Maalox  DVT Prophylaxis Heparin  Code Status: Full  Family Communication: None at bedside  Disposition Plan: Admitted.  Return to SNF on 06/27/2019 as he refused to leave today.  Covid test negative.  Consultants Orthopedic surgery  Procedures  Revision of right AKA and application of Prevena customizable water form wound VAC  Antibiotics   Anti-infectives (From admission, onward)   Start     Dose/Rate Route Frequency Ordered Stop   06/26/19 1115  cephALEXin (KEFLEX) capsule 500 mg     500 mg Oral 3 times daily 06/26/19 1107 07/10/19 0959   06/26/19 1115  clindamycin (CLEOCIN) capsule 300 mg     300 mg Oral 3 times daily 06/26/19 1107 07/03/19 0959   06/25/19 2000  vancomycin (VANCOREADY) IVPB 1500 mg/300 mL  Status:  Discontinued     1,500 mg 150 mL/hr over 120 Minutes Intravenous Every 24 hours 06/25/19 1533 06/26/19 1106   06/24/19 2000  vancomycin (VANCOREADY) IVPB 1250 mg/250 mL  Status:  Discontinued     1,250 mg 166.7 mL/hr over 90 Minutes Intravenous Every 24 hours 06/24/19 1520 06/25/19 1533   06/24/19 1600  ceFEPIme (MAXIPIME) 2 g in sodium chloride 0.9 % 100 mL IVPB  Status:  Discontinued     2 g 200 mL/hr over 30 Minutes Intravenous Every 8 hours 06/24/19 1522  06/26/19 1106   06/23/19 2000  vancomycin (VANCOCIN) IVPB 1000 mg/200 mL premix  Status:  Discontinued     1,000 mg 200 mL/hr over 60 Minutes Intravenous Every 24 hours 06/23/19 1522 06/24/19 1520   06/23/19 1800   vancomycin (VANCOREADY) IVPB 750 mg/150 mL  Status:  Discontinued     750 mg 150 mL/hr over 60 Minutes Intravenous Every 24 hours 06/22/19 1757 06/23/19 1522   06/23/19 1000  ceFEPIme (MAXIPIME) 2 g in sodium chloride 0.9 % 100 mL IVPB  Status:  Discontinued     2 g 200 mL/hr over 30 Minutes Intravenous Every 12 hours 06/22/19 2044 06/24/19 1522   06/22/19 1900  ceFEPIme (MAXIPIME) 2 g in sodium chloride 0.9 % 100 mL IVPB     2 g 200 mL/hr over 30 Minutes Intravenous  Once 06/22/19 1851 06/22/19 2249   06/22/19 1800  vancomycin (VANCOREADY) IVPB 2000 mg/400 mL     2,000 mg 200 mL/hr over 120 Minutes Intravenous  Once 06/22/19 1757 06/22/19 2242   06/22/19 1730  vancomycin (VANCOREADY) IVPB 1500 mg/300 mL  Status:  Discontinued     1,500 mg 150 mL/hr over 120 Minutes Intravenous  Once 06/22/19 1723 06/22/19 1757   06/22/19 1645  piperacillin-tazobactam (ZOSYN) IVPB 3.375 g     3.375 g 100 mL/hr over 30 Minutes Intravenous  Once 06/22/19 1634 06/22/19 1752      Subjective:   Gregory [name] seen and examined today.  Patient feels that he is mildly improved as compared to recent days however continues to have pain and feels it is not adequately controlled.  States he just does not feel well today and does not feel he can leave the hospital today.  Denies chest pain, shortness of breath, abdominal pain.  Does complain of nausea (see however patient did eat all of his breakfast).   Objective:   Vitals:   06/25/19 1736 06/25/19 2033 06/26/19 0501 06/26/19 0804  BP: 136/79 127/65 129/76   Pulse: 64 (!) 55 (!) 58   Resp: 17 16 16    Temp: 97.9 F (36.6 C) 98.3 F (36.8 C) 97.7 F (36.5 C)   TempSrc: Oral Oral Oral   SpO2: 97% 94% 94% 96%  Weight:      Height:        Intake/Output Summary (Last 24 hours) at 06/26/2019 1244 Last data filed at 06/26/2019 1129 Gross per 24 hour  Intake 1779.85 ml  Output 1400 ml  Net 379.85 ml   Filed Weights   06/22/19 1035 06/22/19 1950  Weight: 99.8  kg 112.3 kg   Exam  General: Well developed, chronically ill appearing, NAD  HEENT: NCAT, mucous membranes moist. Poor dentition  Cardiovascular: S1 S2 auscultated, RRR  Respiratory: Clear to auscultation bilaterally   Abdomen: Soft, nontender, nondistended, + bowel sounds  Extremities:  R AKA with wound VAC  Neuro: AAOx3, nonfocal  Data Reviewed: I have personally reviewed following labs and imaging studies  CBC: Recent Labs  Lab 06/22/19 1037 06/24/19 1104 06/25/19 0621  WBC 10.6* 10.0  --   NEUTROABS 7.6  --   --   HGB 9.4* 9.0* 8.9*  HCT 32.2* 30.4* 29.6*  MCV 89.2 85.9  --   PLT 308 293  --    Basic Metabolic Panel: Recent Labs  Lab 06/22/19 1037 06/24/19 1104 06/25/19 0621  NA 138 136 136  K 4.3 4.5 4.0  CL 101 101 100  CO2 27 22 23   GLUCOSE 147* 269* 249*  BUN 26* 22* 24*  CREATININE 1.90* 1.55* 1.36*  CALCIUM 8.9 8.8* 9.0   GFR: Estimated Creatinine Clearance: 74.8 mL/min (A) (by C-G formula based on SCr of 1.36 mg/dL (H)). Liver Function Tests: Recent Labs  Lab 06/22/19 1037  AST 10*  ALT 11  ALKPHOS 90  BILITOT 0.4  PROT 7.2  ALBUMIN 2.6*   No results for input(s): LIPASE, AMYLASE in the last 168 hours. No results for input(s): AMMONIA in the last 168 hours. Coagulation Profile: Recent Labs  Lab 06/22/19 1037  INR 1.0   Cardiac Enzymes: No results for input(s): CKTOTAL, CKMB, CKMBINDEX, TROPONINI in the last 168 hours. BNP (last 3 results) No results for input(s): PROBNP in the last 8760 hours. HbA1C: No results for input(s): HGBA1C in the last 72 hours. CBG: Recent Labs  Lab 06/25/19 0800 06/25/19 1200 06/25/19 1744 06/25/19 2035 06/26/19 0820  GLUCAP 208* 249* 269* 211* 210*   Lipid Profile: No results for input(s): CHOL, HDL, LDLCALC, TRIG, CHOLHDL, LDLDIRECT in the last 72 hours. Thyroid Function Tests: No results for input(s): TSH, T4TOTAL, FREET4, T3FREE, THYROIDAB in the last 72 hours. Anemia Panel: No results  for input(s): VITAMINB12, FOLATE, FERRITIN, TIBC, IRON, RETICCTPCT in the last 72 hours. Urine analysis:    Component Value Date/Time   COLORURINE YELLOW 06/22/2019 2053   APPEARANCEUR CLEAR 06/22/2019 2053   LABSPEC 1.014 06/22/2019 2053   PHURINE 5.0 06/22/2019 2053   GLUCOSEU NEGATIVE 06/22/2019 2053   HGBUR NEGATIVE 06/22/2019 2053   [place] NEGATIVE 06/22/2019 2053   [place] NEGATIVE 06/22/2019 2053   PROTEINUR 30 (A) 06/22/2019 2053   UROBILINOGEN 1.0 02/03/2014 0052   NITRITE NEGATIVE 06/22/2019 2053   LEUKOCYTESUR NEGATIVE 06/22/2019 2053   Sepsis Labs: @LABRCNTIP (procalcitonin:4,lacticidven:4)  ) Recent Results (from the past 240 hour(s))  Culture, blood (single)     Status: None (Preliminary result)   Collection Time: 06/22/19 11:04 AM   Specimen: BLOOD RIGHT ARM  Result Value Ref Range Status   Specimen Description BLOOD RIGHT ARM  Final   Special Requests   Final    BOTTLES DRAWN AEROBIC AND ANAEROBIC Blood Culture adequate volume   Culture   Final    NO GROWTH 4 DAYS Performed at [facility], [place] [address] [place], [place] [address]    Report Status PENDING  Incomplete  Wound or Superficial Culture     Status: None   Collection Time: 06/22/19  4:40 PM   Specimen: Wound  Result Value Ref Range Status   Specimen Description WOUND RIGHT LEG  Final   Special Requests STUMP SITE  Final   Gram Stain   Final    FEW WBC PRESENT, PREDOMINANTLY PMN ABUNDANT GRAM POSITIVE COCCI MODERATE GRAM NEGATIVE RODS    Culture   Final    ABUNDANT ESCHERICHIA COLI MODERATE METHICILLIN RESISTANT STAPHYLOCOCCUS AUREUS MODERATE GROUP B STREP(S.AGALACTIAE)ISOLATED TESTING AGAINST S. AGALACTIAE NOT ROUTINELY PERFORMED DUE TO PREDICTABILITY OF AMP/PEN/VAN SUSCEPTIBILITY. Performed at [facility], [place] [address] [place], [place] [address]    Report Status 06/25/2019 FINAL  Final   Organism ID, Bacteria ESCHERICHIA COLI  Final   Organism ID, Bacteria  METHICILLIN RESISTANT STAPHYLOCOCCUS AUREUS  Final      Susceptibility   Escherichia coli - MIC*    AMPICILLIN >=32 RESISTANT Resistant     CEFAZOLIN <=4 SENSITIVE Sensitive     CEFEPIME <=0.12 SENSITIVE Sensitive     CEFTAZIDIME <=1 SENSITIVE Sensitive     CEFTRIAXONE <=0.25 SENSITIVE Sensitive  CIPROFLOXACIN >=4 RESISTANT Resistant     GENTAMICIN <=1 SENSITIVE Sensitive     IMIPENEM <=0.25 SENSITIVE Sensitive     TRIMETH/SULFA >=320 RESISTANT Resistant     AMPICILLIN/SULBACTAM 16 INTERMEDIATE Intermediate     PIP/TAZO <=4 SENSITIVE Sensitive     * ABUNDANT ESCHERICHIA COLI   Methicillin resistant staphylococcus aureus - MIC*    CIPROFLOXACIN >=8 RESISTANT Resistant     ERYTHROMYCIN >=8 RESISTANT Resistant     GENTAMICIN <=0.5 SENSITIVE Sensitive     OXACILLIN >=4 RESISTANT Resistant     TETRACYCLINE >=16 RESISTANT Resistant     VANCOMYCIN 1 SENSITIVE Sensitive     TRIMETH/SULFA >=320 RESISTANT Resistant     CLINDAMYCIN <=0.25 SENSITIVE Sensitive     RIFAMPIN <=0.5 SENSITIVE Sensitive     Inducible Clindamycin NEGATIVE Sensitive     * MODERATE METHICILLIN RESISTANT STAPHYLOCOCCUS AUREUS  SARS CORONAVIRUS 2 (TAT 6-24 HRS) Nasopharyngeal Nasopharyngeal Swab     Status: None   Collection Time: 06/22/19  7:58 PM   Specimen: Nasopharyngeal Swab  Result Value Ref Range Status   SARS Coronavirus 2 NEGATIVE NEGATIVE Final    Comment: (NOTE) SARS-CoV-2 target nucleic acids are NOT DETECTED. The SARS-CoV-2 RNA is generally detectable in upper and lower respiratory specimens during the acute phase of infection. Negative results do not preclude SARS-CoV-2 infection, do not rule out co-infections with other pathogens, and should not be used as the sole basis for treatment or other patient management decisions. Negative results must be combined with clinical observations, patient history, and epidemiological information. The expected result is Negative. Fact Sheet for  Patients: SugarRoll.be Fact Sheet for Healthcare Providers: https://www.woods-mathews.com/ This test is not yet approved or cleared by the [place] FDA and  has been authorized for detection and/or diagnosis of SARS-CoV-2 by FDA under an Emergency Use Authorization (EUA). This EUA will remain  in effect (meaning this test can be used) for the duration of the COVID-19 declaration under Section 56 4(b)(1) of the Act, 21 U.S.C. section 360bbb-3(b)(1), unless the authorization is terminated or revoked sooner. Performed at [facility], [place] [address], [place], [place] [address]   SARS CORONAVIRUS 2 (TAT 6-24 HRS) Nasopharyngeal Nasopharyngeal Swab     Status: None   Collection Time: 06/25/19 10:56 AM   Specimen: Nasopharyngeal Swab  Result Value Ref Range Status   SARS Coronavirus 2 NEGATIVE NEGATIVE Final    Comment: (NOTE) SARS-CoV-2 target nucleic acids are NOT DETECTED. The SARS-CoV-2 RNA is generally detectable in upper and lower respiratory specimens during the acute phase of infection. Negative results do not preclude SARS-CoV-2 infection, do not rule out co-infections with other pathogens, and should not be used as the sole basis for treatment or other patient management decisions. Negative results must be combined with clinical observations, patient history, and epidemiological information. The expected result is Negative. Fact Sheet for Patients: SugarRoll.be Fact Sheet for Healthcare Providers: https://www.woods-mathews.com/ This test is not yet approved or cleared by the [place] FDA and  has been authorized for detection and/or diagnosis of SARS-CoV-2 by FDA under an Emergency Use Authorization (EUA). This EUA will remain  in effect (meaning this test can be used) for the duration of the COVID-19 declaration under Section 56 4(b)(1) of the Act, 21 U.S.C. section  360bbb-3(b)(1), unless the authorization is terminated or revoked sooner. Performed at [facility], [place] [address] [place], [place] [address]       Radiology Studies: No results found.   Scheduled Meds: .  amLODipine  10 mg Oral Daily  . atorvastatin  20 mg Oral QHS  . buPROPion  300 mg Oral QHS  . cephALEXin  500 mg Oral TID  . clindamycin  300 mg Oral TID  . docusate sodium  100 mg Oral BID  . famotidine  20 mg Oral BID  . ferrous sulfate  325 mg Oral Q breakfast  . finasteride  5 mg Oral Daily  . fluticasone  1 spray Each Nare Daily  . furosemide  40 mg Oral Daily  . heparin  5,000 Units Subcutaneous Q8H  . insulin aspart  0-15 Units Subcutaneous TID WC  . loratadine  10 mg Oral Daily  . magnesium oxide  400 mg Oral BID  . metoprolol tartrate  50 mg Oral Daily  . mirabegron ER  50 mg Oral Daily  . nicotine  14 mg Transdermal QHS  . oxybutynin  15 mg Oral QHS  . pantoprazole  40 mg Oral Daily  . PARoxetine  10 mg Oral QHS  . pregabalin  150 mg Oral BID  . senna-docusate  2 tablet Oral QHS  . tamsulosin  0.8 mg Oral QPM  . umeclidinium bromide  1 puff Inhalation Daily  . vitamin B-12  1,000 mcg Oral Daily   Continuous Infusions:    LOS: 4 days   Time Spent in minutes   45 minutes  Jeret Goyer D.O. on 06/26/2019 at 12:44 PM  Between 7am to 7pm - Please see pager noted on [email or web address]  After 7pm go to [email or web address]  And look for the night coverage person covering for me after hours  [name] Office  [phone]

## 2019-06-26 NOTE — Progress Notes (Signed)
Patient still focused on pain management. Also complaining of nausea. Doesn't want to dc to SNF until tomorrow. Concerned SNF wont manage pain. Threatened to get an attorney.  [place] in [facility]  in place. From an orthopedic standpoint can be discharged to SNF when medically stable

## 2019-06-26 NOTE — Evaluation (Signed)
Occupational Therapy Evaluation Patient Details Name: Gregory [name] MRN: [id number] DOB: Nov 09, 1960 Today's Date: 06/26/2019    History of Present Illness 59 y.o. 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 Pt is now s/p revision of R AKA stump on 1/7 with placement of wound vac   Clinical Impression   Pt in bed upon arrival and refused EOB and OOB activity due to R LE pain. Pt agreeable to bed level activity. Pt is a LTC resident of a SNF and will be returning later today or tomorrow. Pt states that his PLOF was Mod I at w/c level and that he propels himself around facility. All education completed and no further acute OT is indicated at this time, any further OT intervention will be deferred to the SNF. OT will sign off    Follow Up Recommendations  SNF    Equipment Recommendations  None recommended by OT    Recommendations for Other Services       Precautions / Restrictions Precautions Precautions: Fall Precaution Comments: R AKA with wound vac Restrictions Weight Bearing Restrictions: Yes RLE Weight Bearing: Non weight bearing Other Position/Activity Restrictions: No orders for WB status, assumed NWB to R AKA surgical site for protection of surgical site and wound vac      Mobility Bed Mobility Overal bed mobility: Modified Independent             General bed mobility comments: pt refused EOB sitting due to R LE pain but agreeale to bed level activity. Per PT note pt mod I with bed mobiliyt to sit EOB  Transfers Overall transfer level: Needs assistance   Transfers: Lateral/Scoot Transfers          Lateral/Scoot Transfers: Supervision General transfer comment: declined     Balance Overall balance assessment: (declined EOB and OOB activity) Sitting-balance support: No upper extremity supported;Feet unsupported Sitting balance-Leahy Scale: Good Sitting balance - Comments: supervision for safety, pt able to move outside BOS with no LOB     Standing balance-Leahy Scale: Poor Standing balance comment: pt used B UE support on EOB to perform lateral scoot with full clearance of hips from bed, min guard for safety                           ADL either performed or assessed with clinical judgement   ADL Overall ADL's : Needs assistance/impaired Eating/Feeding: Independent;Sitting;Bed level   Grooming: Wash/dry hands;Wash/dry face;Set up;Bed level   Upper Body Bathing: Set up;Bed level   Lower Body Bathing: Minimal assistance;Bed level   Upper Body Dressing : Set up;Bed level   Lower Body Dressing: Minimal assistance;Bed level     Toilet Transfer Details (indicate cue type and reason): declined                 Vision Patient Visual Report: No change from baseline       Perception     Praxis      Pertinent Vitals/Pain Pain Assessment: 0-10 Pain Score: 8  Pain Location: R amputation site and phantom pain Pain Descriptors / Indicators: Sore;Grimacing;Throbbing Pain Intervention(s): Limited activity within patient's tolerance;Monitored during session;Patient requesting pain meds-RN notified     Hand Dominance Right   Extremity/Trunk Assessment Upper Extremity Assessment  Upper Extremity Assessment: Overall WFL for tasks assessed   Lower Extremity Assessment Lower Extremity Assessment: Defer to PT evaluation RLE Deficits / Details: pt with R AKA, assumed NWB to surgical site, very painful for pt. pt reports phantom pain as well in R limb   Cervical / Trunk Assessment Cervical / Trunk Assessment: Normal   Communication Communication Communication: HOH   Cognition Arousal/Alertness: Awake/alert Behavior During Therapy: WFL  for tasks assessed/performed Overall Cognitive Status: Within Functional Limits for tasks assessed                                 General Comments: Pt very pleasant and cooperative, declined OOB this AM due to pain, but agreed to participate with CNA later in day.   General Comments       Exercises     Shoulder Instructions      Home Living Family/patient expects to be discharged to:: Skilled nursing facility                                 Additional Comments: permanent resident of facility (pt reports 3 years at SNF)      Prior Functioning/Environment Level of Independence: Needs assistance  Gait / Transfers Assistance Needed: mod I at wheelchair level ADL's / [place] Needed: Reports can dress/bathe self- goes into the shower, facility provides meals            OT Problem List: Pain;Decreased activity tolerance      OT Treatment/Interventions:      OT Goals(Current goals can be found in the care plan section) Acute Rehab OT Goals Patient Stated Goal: to return to facility OT Goal Formulation: With patient  OT Frequency:     Barriers to D/C:            Co-evaluation              AM-PAC OT "6 Clicks" Daily Activity     Outcome Measure Help from another person eating meals?: None Help from another person taking care of personal grooming?: None Help from another person toileting, which includes using toliet, bedpan, or urinal?: A Little Help from another person bathing (including washing, rinsing, drying)?: A Little Help from another person to put on and taking off regular upper body clothing?: None Help from another person to put on and taking off regular lower body clothing?: A Little 6 Click Score: 21   End of Session    Activity Tolerance: Patient limited by pain Patient left: in bed;with call bell/phone within reach;with nursing/sitter in room  OT Visit Diagnosis: Pain Pain - Right/Left: Right Pain -  part of body: Leg                Time: 1104-1120 OT Time Calculation (min): 16 min Charges:  OT General Charges $OT Visit: 1 Visit OT Evaluation $OT Eval Low Complexity: 1 Low    Britt Bottom 06/26/2019, 12:58 PM

## 2019-06-26 NOTE — Progress Notes (Signed)
Patient refused H&H to be drawn this morning. Phlebotomy will try again this afternoon.

## 2019-06-26 NOTE — Evaluation (Signed)
Physical Therapy Evaluation Patient Details Name: Gregory [name] MRN: [id number] DOB: 06/11/1961 Today's Date: 06/26/2019   History of Present Illness  59 y.o. 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 Pt is now s/p revision of R AKA stump on 1/7 with placement of wound vac  Clinical Impression  Pt in bed upon arrival of PT, and was agreeable to limited session due to pain. The pt presents with limitations in functional mobility and independence due to above dx and recent revision of R AKA. The pt uses a wc at baseline and is long-term resident of SNF where he completes ADLs independently and has IADLs provided for him. The pt was able to demonstrate good safety with bed mobility and lateral scooting (no AD, full clearance of hips with stance on LLE) with supervision for safety. The pt demonstrated good awareness of safety and line management today with mobility, and promises to get up to recliner with squat-pivot transfer later this afternoon. The pt reports he feels at his baseline for mobility, and has no mobility concerns regarding d/c to SNF where he is long term-resident. The pt will continue to benefit from PT acutely to maximize independence and mobility, but would be safe to d/c to SNF when medically appropriate.     Follow Up Recommendations No PT follow up. Return to SNF where he is long-term resident.     Equipment Recommendations  None recommended by PT    Recommendations for Other Services       Precautions / Restrictions Precautions Precautions: Fall Precaution Comments: R AKA with wound vac Restrictions Other Position/Activity Restrictions: No orders for WB status, assumed NWB to R AKA  surgical site for protection of surgical site and wound vac      Mobility  Bed Mobility Overal bed mobility: Modified Independent             General bed mobility comments: pt able to safely transition to sitting EOB without physical assist and back to supine.  Transfers Overall transfer level: Needs assistance   Transfers: Lateral/Scoot Transfers          Lateral/Scoot Transfers: Supervision General transfer comment: pt declines transfer to chair at this time, was able to scoot laterally to [facility] with supervision for safety and use of LLE to fully clear hips from bed  Ambulation/Gait             General Gait Details: pt declined, uses WC at baseline, reports he will perform stand-pivot to recliner later  Stairs            Wheelchair Mobility    Modified Rankin (Stroke Patients Only)       Balance Overall balance assessment: Needs assistance Sitting-balance support: No upper extremity supported;Feet unsupported Sitting balance-Leahy Scale: Good Sitting balance - Comments: supervision for safety, pt able to move outside BOS with no LOB     Standing balance-Leahy Scale: Poor Standing balance comment: pt used B UE support on EOB to perform lateral scoot with full clearance of hips from bed, min guard for safety                             Pertinent Vitals/Pain Pain Assessment: 0-10 Pain Score: 8  Pain  Location: R amputation site and phantom pain Pain Descriptors / Indicators: Sore;Grimacing;Throbbing Pain Intervention(s): Limited activity within patient's tolerance;Monitored during session;Repositioned;Premedicated before session    [place] expects to be discharged to:: Skilled nursing facility                 Additional Comments: permanent resident of facility (pt reports 3 years at [facility])    Prior Function Level of Independence: Needs assistance   Gait / Transfers Assistance Needed: mod I at wheelchair  level  ADL's / Homemaking Assistance Needed: Reports can dress/bathe self- goes into the shower, facility provides meals        Hand Dominance   Dominant Hand: Right    Extremity/Trunk Assessment   Upper Extremity Assessment Upper Extremity Assessment: Overall WFL for tasks assessed    Lower Extremity Assessment Lower Extremity Assessment: Overall WFL for tasks assessed;RLE deficits/detail RLE Deficits / Details: pt with R AKA, assumed NWB to surgical site, very painful for pt. pt reports phantom pain as well in R limb    Cervical / Trunk Assessment Cervical / Trunk Assessment: Normal  Communication   Communication: HOH  Cognition Arousal/Alertness: Awake/alert Behavior During Therapy: WFL for tasks assessed/performed Overall Cognitive Status: Within Functional Limits for tasks assessed                                 General Comments: Pt very pleasant and cooperative, declined OOB this AM due to pain, but agreed to participate with CNA later in day.      General Comments      Exercises     Assessment/Plan    PT Assessment Patient needs continued PT services  PT Problem List Decreased strength;Decreased mobility;Decreased range of motion;Decreased activity tolerance;Decreased skin integrity;Decreased balance;Pain       PT Treatment Interventions DME instruction;Therapeutic exercise;Balance training;Gait training;Functional mobility training;Patient/family education;Therapeutic activities;Wheelchair mobility training(transfer training)    PT Goals (Current goals can be found in the Care Plan section)  Acute Rehab PT Goals Patient Stated Goal: to return to facility PT Goal Formulation: With patient Time For Goal Achievement: 07/10/19 Potential to Achieve Goals: Good    Frequency Min 2X/week   Barriers to discharge        Co-evaluation               AM-PAC PT "6 Clicks" Mobility  Outcome Measure Help needed turning from your back to  your side while in a flat bed without using bedrails?: None Help needed moving from lying on your back to sitting on the side of a flat bed without using bedrails?: None Help needed moving to and from a bed to a chair (including a wheelchair)?: A Little Help needed standing up from a chair using your arms (e.g., wheelchair or bedside chair)?: A Little Help needed to walk in hospital room?: A Lot Help needed climbing 3-5 steps with a railing? : Total 6 Click Score: 17    End of Session Equipment Utilized During Treatment: Other (comment)(none, wound VAC R AKA surgical site) Activity Tolerance: Patient tolerated treatment well;Patient limited by pain(pt declined transfer to recliner due to pain at this time) Patient left: in bed;with call bell/phone within reach Nurse Communication: Mobility status(pt desire to transfer to recliner later) PT Visit Diagnosis: Difficulty in walking, not elsewhere classified (R26.2);Other abnormalities of gait and mobility (R26.89);Pain Pain - Right/Left: Right Pain - part of body: Hip;Leg    Time: [id number]  PT Time Calculation (min) (ACUTE ONLY): 24 min   Charges:   PT Evaluation $PT Eval Low Complexity: 1 Low PT Treatments $Therapeutic Activity: 8-22 mins        Karma Ganja, PT, DPT   Acute Rehabilitation Department [phone]  Otho Bellows 06/26/2019, 10:12 AM

## 2019-06-26 NOTE — TOC Progression Note (Addendum)
Transition of Care [facility]) - Progression Note    Patient Details  Name: Gregory [name] MRN: [id number] Date of Birth: 05-08-1961  Transition of Care [facility] CM/SW [place], [place] Phone Number: 06/26/2019, 8:54 AM  Clinical Narrative:    8:54am- CSW aware of pt concerns as is SNF, following for stability for discharge. Negative COVID has resulted. SNF ready for pt return.  12:15pm- Per MD plan for dc tomorrow. CSW has let SNF know. COVID negative has resulted.     Expected Discharge Plan: Skilled Nursing Facility Barriers to Discharge: Continued Medical Work up  Expected Discharge Plan and Services Expected Discharge Plan: [place] In-house Referral: Clinical Social Work Discharge Planning Services: CM Consult Post Acute Care Choice: Resumption of Svcs/PTA Provider, [place] Living arrangements for the past 2 months: [place]     Readmission Risk Interventions Readmission Risk Prevention Plan 06/23/2019 12/07/2018 11/20/2018  Transportation Screening Complete Complete Complete  PCP or Specialist Appt within 3-5 Days Not Complete - -  Not Complete comments SNF pt - -  [place] or Home Care Consult Complete - -  Social Work Consult for [place] Planning/Counseling Complete - -  Palliative Care Screening Not Applicable - -  Medication Review Press photographer) Referral to Pharmacy Complete Complete  PCP or Specialist appointment within 3-5 days of discharge Not Complete Complete Complete  PCP/Specialist Appt Not Complete comments SNF pt - -  [place] or Home Care Consult Complete Not Complete Not Complete  HRI or Home Care Consult Pt Refusal Comments - pt from SNF LTC pt is LTC SNF resident  SW Recovery Care/Counseling Consult Complete Complete Complete  Palliative Care Screening Not Applicable Not Applicable Not Applicable  Skilled Nursing Facility Complete Complete Complete  Some recent data might be hidden

## 2019-06-27 LAB — GLUCOSE, CAPILLARY
Glucose-Capillary: 202 mg/dL — ABNORMAL HIGH (ref 70–99)
Glucose-Capillary: 188 mg/dL — ABNORMAL HIGH (ref 70–99)

## 2019-06-27 LAB — CULTURE, BLOOD (SINGLE)
Culture: NO GROWTH
Special Requests: ADEQUATE

## 2019-06-27 MED ORDER — CLINDAMYCIN HCL 300 MG PO CAPS: 300.0000 mg | ORAL_CAPSULE | Freq: Three times a day (TID) | ORAL | 0 refills | Status: DC

## 2019-06-27 MED ORDER — ALPRAZOLAM 0.25 MG PO TABS: 0.2500 mg | ORAL_TABLET | Freq: Two times a day (BID) | ORAL | 0 refills | Status: DC | PRN

## 2019-06-27 MED ORDER — CEPHALEXIN 500 MG PO CAPS: 500.0000 mg | ORAL_CAPSULE | Freq: Three times a day (TID) | ORAL | 0 refills | Status: DC

## 2019-06-27 MED ORDER — TIZANIDINE HCL 4 MG PO TABS: 4.0000 mg | ORAL_TABLET | Freq: Three times a day (TID) | ORAL | 0 refills | Status: DC | PRN

## 2019-06-27 NOTE — Discharge Summary (Signed)
Physician Discharge Summary  Gregory [name] [name] DOB: 03-02-1961 DOA: 06/22/2019  PCP: [name], MD  Admit date: 06/22/2019 Discharge date: 06/27/2019  Time spent: 45 minutes  Recommendations for Outpatient Follow-up:  Patient will be discharged to skilled nursing facility.  Patient will need to follow up with primary care provider within one week of discharge.  Follow up with [name] in one week.  Patient should continue medications as prescribed.  Patient should follow a carb modified diet.   Discharge Diagnoses:  Right lower extremity/stump infection/cellulitis Chronic kidney disease, stage IIIb Diabetes mellitus, type II Essential hypertension Chronic diastolic heart failure Anemia of chronic disease Depression/anxiety Indigestion  Discharge Condition: Stable  Diet recommendation: carb modified   Filed Weights   06/22/19 1035 06/22/19 1950  Weight: 99.8 kg 112.3 kg    History of present illness:  on 06/22/19 by Dr. 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  Hospital Course:  Right lower extremity/stump infection/cellulitis -Of note, Patient has had multiple positive MRSA screenings in the past -Started on vancomycin and cefepime -Orthopedic surgery consulted and appreciated, status post revision of right AKA and application of wound VAC -Continue pain control, have changed oxycodone 20mg  q4hr PRN.   -Blood  culture showed no growth to date -Wound culture: Showed abundant E. coli resistant to Cipro, Bactrim, ampicillin as well as Augmentin.  Also showed MRSA fairly resistant. -continue zanaflex -Patient requesting Dilaudid to be switched to morphine and would like to have an anxiolytic. -Have placed patient on Keflex as well as clindamycin  Chronic kidney disease, stage IIIb -On review of patient's chart, his GFR has been in stage IIIb range since 2019 -Creatinine stable, currently 1.36  Diabetes mellitus, type II -Hemoglobin A1c 8.6 -continue home meds  Essential hypertension -Continue amlodipine, Lasix, metoprolol  Chronic diastolic heart failure -Appears to be euvolemic and compensated -Continue Lasix, intake and output, daily weights  Anemia of chronic disease -hemoglobin was 8.9 on 06/25/19- stable -Refused labs this morning  Depression/anxiety -Continue Paxil, Wellbutrin -Added on low-dose Xanax, but ultimately patient will need to follow-up with his PCP continuation of this  Indigestion -Continue Pepcid as well as Maalox  Consultants Orthopedic surgery  Procedures  Revision of right AKA and application of Prevena customizable water form wound VAC  Discharge Exam: Vitals:   06/26/19 2045 06/27/19 0602  BP: 131/68 (!) 148/81  Pulse: 68 69  Resp: 18 18  Temp: 99.1 F (37.3 C) 98.4 F (36.9 C)  SpO2: 91% 98%     General: Well developed, chronically ill appearing, NAD  HEENT: NCAT, mucous membranes moist. Poor dentition  Extremities: R AKA with wound VAC  Neuro: AAOx3, nonfocal  Discharge Instructions Discharge Instructions    Discharge instructions   Complete by: As directed    Patient will be discharged to skilled nursing facility.  Patient will need to follow up with primary care provider within one week of discharge.  Follow up with [name] in one week.  Patient should continue medications as prescribed.  Patient should follow a carb modified  diet.   Neg Press Wound Therapy / Incisional   Complete by: As directed    Show patient how to attach prevena pump   Neg Press Wound Therapy / Incisional   Complete by: As directed    Attach vac dressing to the portable Pravena vac pump. Show patient how to plug in the Pravena pump to keep it charged.     Allergies as of 06/27/2019      Reactions   Latex Other (See Comments)   Listed on MAR- pt sts he is not allergic.      Medication List    STOP taking these medications   collagenase ointment Commonly known as: SANTYL   doxycycline 100 MG tablet Commonly known as: VIBRA-TABS   oxyCODONE-acetaminophen 10-325 MG tablet Commonly known as: Percocet Replaced by: oxyCODONE-acetaminophen 5-325 MG tablet   rosuvastatin 10 MG tablet Commonly known as: CRESTOR     TAKE these medications   acetaminophen 325 MG tablet Commonly known as: TYLENOL Take 650 mg by mouth every 4 (four) hours as needed (general discomfort).   albuterol 108 (90 Base) MCG/ACT inhaler Commonly known as: VENTOLIN HFA Inhale 2 puffs into the lungs every 6 (six) hours as needed for wheezing or shortness of breath.   ALPRAZolam 0.25 MG tablet Commonly known as: XANAX Take 1 tablet (0.25 mg total) by mouth 2 (two) times daily as needed for anxiety.   amLODipine 10 MG tablet Commonly known as: NORVASC Take 10 mg by mouth daily. for HTN   atorvastatin 20 MG tablet Commonly known as: LIPITOR Take 20 mg by mouth at bedtime.   Baclofen 5 MG Tabs Take 10 mg by mouth 3 (three) times daily.   Biofreeze 4 % Gel Generic drug: Menthol (Topical Analgesic) Apply 1 application topically every 4 (four) hours as needed (pain). Apply to right stump   bisacodyl 10 MG suppository Commonly known as: DULCOLAX Place 1 suppository (10 mg total) rectally daily as needed for moderate constipation.   buPROPion 300 MG 24 hr tablet Commonly known as: WELLBUTRIN XL Take 300 mg by mouth at bedtime.   calcium carbonate 500  MG chewable tablet Commonly known as: TUMS - dosed in mg elemental calcium Chew 2 tablets by mouth every 8 (eight) hours as needed for indigestion or heartburn.   cephALEXin 500 MG capsule Commonly known as: KEFLEX Take 1 capsule (500 mg total) by mouth 3 (three) times daily for 42 doses.   cetirizine 10 MG tablet Commonly known as: ZYRTEC Take 10 mg by mouth daily.   CETYLPYRIDINIUM CHLORIDE MT Use as directed 15 mLs in the mouth or throat every 4 (four) hours as needed (dry mouth).   clindamycin 300 MG capsule Commonly known as: CLEOCIN Take 1 capsule (300 mg total) by mouth 3 (three) times daily for 21 days.   docusate sodium 100 MG capsule Commonly known as: COLACE Take 1 capsule (100 mg total) by mouth 2 (two) times daily.   eszopiclone 1 MG Tabs tablet Commonly known as: LUNESTA Take 1 mg by mouth at bedtime. Take immediately before bedtime   ferrous sulfate 325 (65 FE) MG tablet Take 1 tablet (325 mg total) by mouth daily with breakfast.   finasteride 5 MG tablet Commonly known as: PROSCAR Take 5 mg by mouth daily.   furosemide 40 MG tablet Commonly known as: LASIX Take 40 mg by mouth daily.   glipiZIDE 10 MG tablet Commonly known as: GLUCOTROL Take 10 mg by mouth daily before breakfast.   glucagon  1 MG injection Inject 1 mg into the muscle once as needed (for CBG <70).   HumaLOG KwikPen 100 UNIT/ML KwikPen Generic drug: insulin lispro Inject 16 Units into the skin 3 (three) times daily before meals.   ibuprofen 800 MG tablet Commonly known as: ADVIL Take 800 mg by mouth 3 (three) times daily.   lidocaine 5 % Commonly known as: LIDODERM Place 1 patch onto the skin daily. Apply to Right stump. Remove & Discard patch within 12 hours or as directed by MD   Lyrica 300 MG capsule Generic drug: pregabalin Take 300 mg by mouth 2 (two) times daily.   magnesium oxide 400 MG tablet Commonly known as: MAG-OX Take 400 mg by mouth 2 (two) times daily.     metoprolol tartrate 50 MG tablet Commonly known as: LOPRESSOR Take 50 mg by mouth daily.   mirabegron ER 50 MG Tb24 tablet Commonly known as: MYRBETRIQ Take 50 mg by mouth daily.   Nasonex 50 MCG/ACT nasal spray Generic drug: mometasone Place 2 sprays into the nose at bedtime.   nicotine 14 mg/24hr patch Commonly known as: NICODERM CQ - dosed in mg/24 hours Place 1 patch (14 mg total) onto the skin at bedtime. What changed: when to take this   omeprazole 20 MG capsule Commonly known as: PRILOSEC Take 40 mg by mouth daily before breakfast.   ondansetron 4 MG tablet Commonly known as: ZOFRAN Take 4 mg by mouth every 6 (six) hours as needed for nausea or vomiting.   oxybutynin 15 MG 24 hr tablet Commonly known as: DITROPAN XL Take 15 mg by mouth at bedtime.   oxyCODONE 5 MG immediate release tablet Commonly known as: Oxy IR/ROXICODONE Take 1 tablet (5 mg total) by mouth every 4 (four) hours as needed for severe pain. What changed:   medication strength  how much to take  when to take this  reasons to take this   Oxycodone HCl 20 MG Tabs Take 1 tablet (20 mg total) by mouth every 4 (four) hours as needed for severe pain (pain). What changed:   medication strength  how much to take  when to take this  reasons to take this   oxyCODONE 5 MG immediate release tablet Commonly known as: Oxy IR/ROXICODONE Take 1 tablet (5 mg total) by mouth every 6 (six) hours as needed for moderate pain. What changed: You were already taking a medication with the same name, and this prescription was added. Make sure you understand how and when to take each.   oxyCODONE-acetaminophen 5-325 MG tablet Commonly known as: PERCOCET/ROXICET Take 1 tablet by mouth every 6 (six) hours as needed for moderate pain. Replaces: oxyCODONE-acetaminophen 10-325 MG tablet   PARoxetine 20 MG tablet Commonly known as: PAXIL Take 20 mg by mouth at bedtime.   polyethylene glycol 17 g  packet Commonly known as: MIRALAX / GLYCOLAX Take 17 g by mouth daily as needed for mild constipation.   senna-docusate 8.6-50 MG tablet Commonly known as: Senokot-S Take 2 tablets by mouth at bedtime.   Spiriva Respimat 1.25 MCG/ACT Aers Generic drug: Tiotropium Bromide Monohydrate Inhale 2 puffs into the lungs daily.   tamsulosin 0.4 MG Caps capsule Commonly known as: FLOMAX Take 0.4 mg by mouth every evening.   terbinafine 250 MG tablet Commonly known as: LAMISIL Take 250 mg by mouth daily.   tiZANidine 4 MG tablet Commonly known as: ZANAFLEX Take 1 tablet (4 mg total) by mouth every 8 (eight) hours as needed for muscle spasms.  vitamin B-12 500 MCG tablet Commonly known as: CYANOCOBALAMIN Take 1,000 mcg by mouth daily.   Vitamin D-3 125 MCG (5000 UT) Tabs Take 5,000 Units by mouth daily.      Allergies  Allergen Reactions  . Latex Other (See Comments)    Listed on MAR- pt sts he is not allergic.   Follow-up Information    [name], MD In 1 week.   Specialty: Orthopedic Surgery Contact information: [place] [address] [phone]        [name], MD. Schedule an appointment as soon as possible for a visit in 1 week(s).   Specialty: Internal Medicine Why: Hospital follow up Contact information: [place] [address] [phone]            The results of significant diagnostics from this hospitalization (including imaging, microbiology, ancillary and laboratory) are listed below for reference.    Significant Diagnostic Studies: No results found.  Microbiology: Recent Results (from the past 240 hour(s))  Culture, blood (single)     Status: None   Collection Time: 06/22/19 11:04 AM   Specimen: BLOOD RIGHT ARM  Result Value Ref Range Status   Specimen Description BLOOD RIGHT ARM  Final   Special Requests   Final    BOTTLES DRAWN AEROBIC AND ANAEROBIC Blood Culture adequate volume   Culture    Final    NO GROWTH 5 DAYS Performed at [facility], [address], [place], [place] [address]    Report Status 06/27/2019 FINAL  Final  Wound or Superficial Culture     Status: None   Collection Time: 06/22/19  4:40 PM   Specimen: Wound  Result Value Ref Range Status   Specimen Description WOUND RIGHT LEG  Final   Special Requests STUMP SITE  Final   Gram Stain   Final    FEW WBC PRESENT, PREDOMINANTLY PMN ABUNDANT GRAM POSITIVE COCCI MODERATE GRAM NEGATIVE RODS    Culture   Final    ABUNDANT ESCHERICHIA COLI MODERATE METHICILLIN RESISTANT STAPHYLOCOCCUS AUREUS MODERATE GROUP B STREP(S.AGALACTIAE)ISOLATED TESTING AGAINST S. AGALACTIAE NOT ROUTINELY PERFORMED DUE TO PREDICTABILITY OF AMP/PEN/VAN SUSCEPTIBILITY. Performed at [facility], [place] [address] [place],  [address]    Report Status 06/25/2019 FINAL  Final   Organism ID, Bacteria ESCHERICHIA COLI  Final   Organism ID, Bacteria METHICILLIN RESISTANT STAPHYLOCOCCUS AUREUS  Final      Susceptibility   Escherichia coli - MIC*    AMPICILLIN >=32 RESISTANT Resistant     CEFAZOLIN <=4 SENSITIVE Sensitive     CEFEPIME <=0.12 SENSITIVE Sensitive     CEFTAZIDIME <=1 SENSITIVE Sensitive     CEFTRIAXONE <=0.25 SENSITIVE Sensitive     CIPROFLOXACIN >=4 RESISTANT Resistant     GENTAMICIN <=1 SENSITIVE Sensitive     IMIPENEM <=0.25 SENSITIVE Sensitive     TRIMETH/SULFA >=320 RESISTANT Resistant     AMPICILLIN/SULBACTAM 16 INTERMEDIATE Intermediate     PIP/TAZO <=4 SENSITIVE Sensitive     * ABUNDANT ESCHERICHIA COLI   Methicillin resistant staphylococcus aureus - MIC*    CIPROFLOXACIN >=8 RESISTANT Resistant     ERYTHROMYCIN >=8 RESISTANT Resistant     GENTAMICIN <=0.5 SENSITIVE Sensitive     OXACILLIN >=4 RESISTANT Resistant     TETRACYCLINE >=16 RESISTANT Resistant     VANCOMYCIN 1 SENSITIVE Sensitive     TRIMETH/SULFA >=320 RESISTANT Resistant     CLINDAMYCIN <=0.25 SENSITIVE Sensitive     RIFAMPIN <=0.5  SENSITIVE  Sensitive     Inducible Clindamycin NEGATIVE Sensitive     * MODERATE METHICILLIN RESISTANT STAPHYLOCOCCUS AUREUS  SARS CORONAVIRUS 2 (TAT 6-24 HRS) Nasopharyngeal Nasopharyngeal Swab     Status: None   Collection Time: 06/22/19  7:58 PM   Specimen: Nasopharyngeal Swab  Result Value Ref Range Status   SARS Coronavirus 2 NEGATIVE NEGATIVE Final    Comment: (NOTE) SARS-CoV-2 target nucleic acids are NOT DETECTED. The SARS-CoV-2 RNA is generally detectable in upper and lower respiratory specimens during the acute phase of infection. Negative results do not preclude SARS-CoV-2 infection, do not rule out co-infections with other pathogens, and should not be used as the sole basis for treatment or other patient management decisions. Negative results must be combined with clinical observations, patient history, and epidemiological information. The expected result is Negative. Fact Sheet for Patients: SugarRoll.be Fact Sheet for Healthcare Providers: https://www.woods-mathews.com/ This test is not yet approved or cleared by the [place] FDA and  has been authorized for detection and/or diagnosis of SARS-CoV-2 by FDA under an Emergency Use Authorization (EUA). This EUA will remain  in effect (meaning this test can be used) for the duration of the COVID-19 declaration under Section 56 4(b)(1) of the Act, 21 U.S.C. section 360bbb-3(b)(1), unless the authorization is terminated or revoked sooner. Performed at [facility], [place] [address], [place], [place] [address]   SARS CORONAVIRUS 2 (TAT 6-24 HRS) Nasopharyngeal Nasopharyngeal Swab     Status: None   Collection Time: 06/25/19 10:56 AM   Specimen: Nasopharyngeal Swab  Result Value Ref Range Status   SARS Coronavirus 2 NEGATIVE NEGATIVE Final    Comment: (NOTE) SARS-CoV-2 target nucleic acids are NOT DETECTED. The SARS-CoV-2 RNA is generally detectable in upper and  lower respiratory specimens during the acute phase of infection. Negative results do not preclude SARS-CoV-2 infection, do not rule out co-infections with other pathogens, and should not be used as the sole basis for treatment or other patient management decisions. Negative results must be combined with clinical observations, patient history, and epidemiological information. The expected result is Negative. Fact Sheet for Patients: SugarRoll.be Fact Sheet for Healthcare Providers: https://www.woods-mathews.com/ This test is not yet approved or cleared by the [place] FDA and  has been authorized for detection and/or diagnosis of SARS-CoV-2 by FDA under an Emergency Use Authorization (EUA). This EUA will remain  in effect (meaning this test can be used) for the duration of the COVID-19 declaration under Section 56 4(b)(1) of the Act, 21 U.S.C. section 360bbb-3(b)(1), unless the authorization is terminated or revoked sooner. Performed at [facility], [place] [address], [place], [place] [address]      Labs: Basic Metabolic Panel: Recent Labs  Lab 06/22/19 1037 06/24/19 1104 06/25/19 0621  NA 138 136 136  K 4.3 4.5 4.0  CL 101 101 100  CO2 27 22 23   GLUCOSE 147* 269* 249*  BUN 26* 22* 24*  CREATININE 1.90* 1.55* 1.36*  CALCIUM 8.9 8.8* 9.0   Liver Function Tests: Recent Labs  Lab 06/22/19 1037  AST 10*  ALT 11  ALKPHOS 90  BILITOT 0.4  PROT 7.2  ALBUMIN 2.6*   No results for input(s): LIPASE, AMYLASE in the last 168 hours. No results for input(s): AMMONIA in the last 168 hours. CBC: Recent Labs  Lab 06/22/19 1037 06/24/19 1104 06/25/19 0621  WBC 10.6* 10.0  --   NEUTROABS 7.6  --   --   HGB 9.4* 9.0* 8.9*  HCT 32.2*  30.4* 29.6*  MCV 89.2 85.9  --   PLT 308 293  --    Cardiac Enzymes: No results for input(s): CKTOTAL, CKMB, CKMBINDEX, TROPONINI in the last 168 hours. BNP: BNP (last 3 results) No results  for input(s): BNP in the last 8760 hours.  ProBNP (last 3 results) No results for input(s): PROBNP in the last 8760 hours.  CBG: Recent Labs  Lab 06/26/19 0820 06/26/19 1308 06/26/19 1648 06/26/19 2049 06/27/19 0808  GLUCAP 210* 243* 184* 271* 202*       Signed:  Carlas Vandyne  [facility] 06/27/2019, 9:08 AM

## 2019-06-27 NOTE — Discharge Instructions (Signed)

## 2019-06-27 NOTE — Progress Notes (Signed)
Report given to RN to receive at [place] [facility] at [place].

## 2019-06-27 NOTE — Progress Notes (Signed)
Discharged patient to skilled nursing facility at [facility] at [name] via [place] [facility]. Instructions given at report  to RN to receive at the facility. Belongings returned accordingly.

## 2019-06-27 NOTE — Progress Notes (Signed)
Called for report, RN not available. Number given to call back

## 2019-06-27 NOTE — TOC Transition Note (Signed)
Transition of Care [facility]) - CM/SW Discharge Note   Patient Details  Name: ATZEL [place] MRN: [id number] Date of Birth: 1960/09/29  [facility]) CM/SW Contact:  Alberteen Sam, LCSW Phone Number: 06/27/2019, 10:34 AM   Clinical Narrative:     Patient will DC to: [facility] Anticipated DC date: 06/27/2019 Family notified:[name] [facility] [id number]  Per MD patient ready for DC to [facility] RN, patient, patient's family, and facility notified of DC. Discharge Summary sent to facility. RN given number for report [phone] Room 146. DC packet on chart. Ambulance transport requested for patient.  CSW signing off.  [place], [place]   Final next level of care: Skilled Nursing Facility Barriers to Discharge: No Barriers Identified   Patient Goals and CMS Choice Patient states their goals for this hospitalization and ongoing recovery are:: for him to return to SNF when ready CMS Medicare.gov Compare Post Acute Care list provided to:: Patient Represenative (must comment)([name] (daughter)) Choice offered to / list presented to : Adult Children  Discharge Placement PASRR number recieved: 06/23/19            Patient chooses bed at: Other - please specify in the comment section below:([facility]) Patient to be transferred to facility by: [facility] Name of family member notified: [name] Patient and family notified of of transfer: 06/27/19  Discharge Plan and Services In-house Referral: Clinical Social Work Discharge Planning Services: CM Consult Post Acute Care Choice: Resumption of Product/process development scientist, [place]                               Social Determinants of Health (SDOH) Interventions     Readmission Risk Interventions Readmission Risk Prevention Plan 06/23/2019 12/07/2018 11/20/2018  Transportation Screening Complete Complete Complete  PCP or Specialist Appt within 3-5 Days Not Complete - -  Not Complete comments SNF pt - -   HRI or Home Care Consult Complete - -  Social Work Consult for [place] Planning/Counseling Complete - -  Palliative Care Screening Not Applicable - -  Medication Review Press photographer) Referral to Pharmacy Complete Complete  PCP or Specialist appointment within 3-5 days of discharge Not Complete Complete Complete  PCP/Specialist Appt Not Complete comments SNF pt - -  HRI or Home Care Consult Complete Not Complete Not Complete  HRI or Home Care Consult Pt Refusal Comments - pt from SNF LTC pt is LTC SNF resident  SW Recovery Care/Counseling Consult Complete Complete Complete  Palliative Care Screening Not Applicable Not Applicable Not Applicable  Skilled Nursing Facility Complete Complete Complete  Some recent data might be hidden

## 2019-07-02 ENCOUNTER — Encounter: Payer: Self-pay | Admitting: Physician Assistant

## 2019-07-02 ENCOUNTER — Other Ambulatory Visit: Payer: Self-pay

## 2019-07-02 ENCOUNTER — Ambulatory Visit (INDEPENDENT_AMBULATORY_CARE_PROVIDER_SITE_OTHER): Payer: Medicaid Other | Admitting: Physician Assistant

## 2019-07-02 VITALS — Ht 72.0 in | Wt 247.0 lb

## 2019-07-02 DIAGNOSIS — M86172 Other acute osteomyelitis, left ankle and foot: Secondary | ICD-10-CM

## 2019-07-02 NOTE — Progress Notes (Signed)
Office Visit Note   Patient: Gregory [name]           Date of Birth: [birth year]           MRN: [id number] Visit Date: 07/02/2019              Requested by: [name], MD [place],  [place] [id number] PCP: [name], MD  Chief Complaint  Patient presents with  . Right Leg - Routine Post Op    16/21 right AKA revision       HPI: This is a pleasant gentleman who is 1 week status post right above-knee amputation revision.  Overall he is doing quite well he has been taking 20 mg of oxycodone every 4 hours for his pain.  He has restrain from touching his wound VAC and he says a great part of this is because he feels like his pain is better controlled.  He is not asking for any increase in his pain medication he is concerned that the nursing home had told him they would be decreasing his medication  Assessment & Plan: Visit Diagnoses: No diagnosis found.  Plan: The patient has a long history of chronic narcotic use.  I do believe while he is recovering from the surgery there should not be significant changes in his medication doses.  He and I discussed in detail that once he is discharged he should seek pain management and he is amenable to this.  My fear is that if he did begins having increased pain he will begin touching at the wound which has been his pattern in the past.  He will follow up in 1 week  Follow-Up Instructions: No follow-ups on file.   Ortho Exam  Patient is alert, oriented, no adenopathy, well-dressed, normal affect, normal respiratory effort. Wound VAC was removed.  Incision is well approximated there is no erythema he has some serous drainage sutures are in place no foul odor no cellulitis  Imaging: No results found. No images are attached to the encounter.  Labs: Lab Results  Component Value Date   HGBA1C 8.6 (H) 05/23/2019   HGBA1C 7.7 (H) 11/17/2018   HGBA1C 8.0 (H) 06/04/2018   ESRSEDRATE 102 (H) 05/27/2019   ESRSEDRATE 120  (H) 03/25/2017   ESRSEDRATE 84 (H) 10/02/2016   CRP 10.2 (H) 05/27/2019   CRP 4.9 (H) 03/25/2017   CRP 30.6 (H) 10/02/2016   REPTSTATUS 06/25/2019 FINAL 06/22/2019   GRAMSTAIN  06/22/2019    FEW WBC PRESENT, PREDOMINANTLY PMN ABUNDANT GRAM POSITIVE COCCI MODERATE GRAM NEGATIVE RODS    CULT  06/22/2019    ABUNDANT ESCHERICHIA COLI MODERATE METHICILLIN RESISTANT STAPHYLOCOCCUS AUREUS MODERATE GROUP B STREP(S.AGALACTIAE)ISOLATED TESTING AGAINST S. AGALACTIAE NOT ROUTINELY PERFORMED DUE TO PREDICTABILITY OF AMP/PEN/VAN SUSCEPTIBILITY. Performed at [facility], [place] [address], [place], [place] [address]    LABORGA ESCHERICHIA COLI 06/22/2019   LABORGA METHICILLIN RESISTANT STAPHYLOCOCCUS AUREUS 06/22/2019     Lab Results  Component Value Date   ALBUMIN 2.6 (L) 06/22/2019   ALBUMIN 3.3 (L) 05/22/2019   ALBUMIN 3.3 (L) 10/22/2018   PREALBUMIN 23.3 03/25/2017    Lab Results  Component Value Date   MG 1.6 (L) 05/30/2019   MG 1.7 05/29/2019   MG 1.6 (L) 05/28/2019   No results found for: VD25OH  Lab Results  Component Value Date   PREALBUMIN 23.3 03/25/2017   CBC EXTENDED Latest Ref Rng & Units 06/25/2019 06/24/2019 06/22/2019  WBC 4.0 - 10.5  K/uL - 10.0 10.6(H)  RBC 4.22 - 5.81 MIL/uL - 3.54(L) 3.61(L)  HGB 13.0 - 17.0 g/dL 8.9(L) 9.0(L) 9.4(L)  HCT 39.0 - 52.0 % 29.6(L) 30.4(L) 32.2(L)  PLT 150 - 400 K/uL - 293 308  NEUTROABS 1.7 - 7.7 K/uL - - 7.6  LYMPHSABS 0.7 - 4.0 K/uL - - 1.8     Body mass index is 33.5 kg/m.  Orders:  No orders of the defined types were placed in this encounter.  No orders of the defined types were placed in this encounter.    Procedures: No procedures performed  Clinical Data: No additional findings.  ROS:  All other systems negative, except as noted in the HPI. Review of Systems  Objective: Vital Signs: Ht 6' (1.829 m)   Wt 247 lb (112 kg)   BMI 33.50 kg/m   Specialty Comments:  No specialty comments  available.  PMFS History: Patient Active Problem List   Diagnosis Date Noted  . Wound infection 06/22/2019  . Abscess 06/22/2019  . CKD (chronic kidney disease), stage III 05/23/2019  . Tobacco use 05/23/2019  . Sepsis ([place]) 05/23/2019  . S/P AKA (above knee amputation) unilateral, right ([place]) 12/05/2018  . Amputation stump infection ([place])   . Lymphedema of left leg   . Diabetic polyneuropathy associated with type 2 diabetes mellitus ([place])   . Below-knee amputation of right lower extremity ([place]) 10/22/2018  . Dehiscence of amputation stump ([place])   . 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[place]) 08/12/2018  . Candidiasis 08/12/2018  . Necrosis of toe ([place]) 07/08/2018  . Amputated toe of left foot ([place]) 06/18/2018  . Streptococcal infection   . Pressure injury of skin 06/06/2018  . Cutaneous abscess of left foot   . Moderate protein-calorie malnutrition ([place])   . Cellulitis of left leg   . Septic arthritis ([place]) 06/03/2018  . Idiopathic chronic venous hypertension of left lower extremity with ulcer and inflammation ([place]) 11/14/2017  . Great toe amputation status, left 11/14/2017  . Acquired absence of right leg below knee ([place]) 06/20/2017  . Osteomyelitis ([place]) 03/25/2017  . Skin cancer 03/25/2017  . Depression, recurrent ([place]) 11/05/2016  . Diabetic ulcer of toe of left foot ([place]) 10/02/2016  . 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 Chronic pain 09/27/2016  . Dyslipidemia associated with type 2 diabetes mellitus ([place]) 06/06/2016  . GERD without esophagitis 05/11/2016  . Hypertensive heart disease with congestive heart failure ([place]) 12/15/2015  . Spinal stenosis in cervical region 12/15/2015  . Type II diabetes mellitus with neurological manifestations ([place]) 12/15/2015  . Chronic diastolic CHF (congestive heart failure) ([place])   . Pulmonary hypertension ([place])   . Urinary retention   . Diskitis   . Foot drop, bilateral   . Sepsis with acute renal failure without septic shock ([place])   .  MRSA bacteremia 10/03/2015  . COPD (chronic obstructive pulmonary disease) ([place]) 10/03/2015  . Acute osteomyelitis of left foot ([place]) 10/03/2015   Past Medical History:  Diagnosis Date  . Acquired absence of left great toe ([place])   . Acquired absence of right leg below knee ([place])   . Acute osteomyelitis of calcaneum, right ([place]) 10/02/2016  . Acute respiratory failure ([place])   . Amputation stump infection ([place]) 08/12/2018  . Anemia   . Basal cell carcinoma, eyelid   . Cancer ([place])   . Candidiasis 08/12/2018  . CHF (congestive heart failure) (H[facility])    chronic diastolic   . Chronic back pain   . Chronic kidney disease  stage 3   . Chronic multifocal osteomyelitis (H[facility])    of ankle and foot   . Chronic pain syndrome   . COPD (chronic obstructive pulmonary disease) ([place])   . DDD (degenerative disc disease), lumbar   . Depression    major depressive disorder   . Diabetes mellitus without complication ([place])    type 2   . Diabetic ulcer of toe of left foot ([place]) 10/02/2016  . Difficulty in walking, not elsewhere classified   . 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 Foot drop, bilateral   . Foot drop, right 12/27/2015  . GERD (gastroesophageal reflux disease)   . Herpesviral vesicular dermatitis   . Hyperlipidemia   . Hyperlipidemia   . Hypertension   . Insomnia   . Malignant melanoma of other parts of face ([place])   . MRSA bacteremia   . Muscle weakness (generalized)   . Nasal congestion   . Necrosis of toe ([place]) 07/08/2018  . Neuromuscular dysfunction of bladder   . Osteomyelitis ([place])   . Osteomyelitis ([place])   . Osteomyelitis ([place])    right BKA  . Other idiopathic peripheral autonomic neuropathy   . Retention of urine, unspecified   . Squamous cell carcinoma of skin   . Unsteadiness on feet   . Urinary retention   . Urinary retention   . Urinary tract infection   . Wears dentures   . Wears glasses     Family History  Problem Relation Age of Onset  . Cancer Other   . Diabetes  Other     Past Surgical History:  Procedure Laterality Date  . AMPUTATION Left 03/29/2017   Procedure: LEFT GREAT TOE AMPUTATION AT METATARSOPHALANGEAL JOINT;  Surgeon: [name], MD;  Location: [facility];  Service: Orthopedics;  Laterality: Left;  . AMPUTATION Right 03/29/2017   Procedure: RIGHT BELOW KNEE AMPUTATION;  Surgeon: [name], MD;  Location: [facility];  Service: Orthopedics;  Laterality: Right;  . AMPUTATION Left 06/06/2018   Procedure: LEFT 2ND TOE AMPUTATION;  Surgeon: [name], MD;  Location: [facility];  Service: Orthopedics;  Laterality: Left;  . AMPUTATION Right 11/19/2018   Procedure: AMPUTATION ABOVE KNEE;  Surgeon: [name], MD;  Location: [facility];  Service: Orthopedics;  Laterality: Right;  . ANTERIOR CERVICAL CORPECTOMY N/A 11/25/2015   Procedure: ANTERIOR CERVICAL FIVE CORPECTOMY Cervical four - six fusion;  Surgeon: Consuella Lose, MD;  Location: [facility];  Service: Neurosurgery;  Laterality: N/A;  ANTERIOR CERVICAL FIVE CORPECTOMY Cervical four - six fusion  . APPLICATION OF WOUND VAC Right 10/22/2018   Procedure: Application Of Wound Vac;  Surgeon: [name], MD;  Location: [facility];  Service: Orthopedics;  Laterality: Right;  . CYSTOSCOPY WITH RETROGRADE URETHROGRAM N/A 04/25/2018   Procedure: CYSTOSCOPY WITH RETROGRADE URETHROGRAM/ BALLOON DILATION;  Surgeon: Kathie Rhodes, MD;  Location: [facility];  Service: Urology;  Laterality: N/A;  . MULTIPLE TOOTH EXTRACTIONS    . POSTERIOR CERVICAL FUSION/FORAMINOTOMY N/A 11/29/2015   Procedure: Cervical Three-Cervical Seven Posterior Cervical Laminectomy with Fusion;  Surgeon: Consuella Lose, MD;  Location: [facility];  Service: Neurosurgery;  Laterality: N/A;  Cervical Three-Cervical Seven Posterior Cervical Laminectomy with Fusion  . STUMP REVISION Right 10/22/2018   Procedure: REVISION RIGHT BELOW KNEE AMPUTATION;  Surgeon: [name], MD;  Location: [facility];  Service: Orthopedics;  Laterality: Right;  . STUMP  REVISION Right 12/05/2018   Procedure: Revision Right Above Knee Amputation;  Surgeon: [name], MD;  Location: [facility];  Service: Orthopedics;  Laterality: Right;  . STUMP REVISION Right 05/29/2019   Procedure: REVISION RIGHT ABOVE KNEE AMPUTATION;  Surgeon: [name], MD;  Location: [facility];  Service: Orthopedics;  Laterality: Right;  . STUMP REVISION Right 06/24/2019   Procedure: STUMP REVISION;  Surgeon: [name], MD;  Location: [facility];  Service: Orthopedics;  Laterality: Right;   Social History   Occupational History  . Not on file  Tobacco Use  . Smoking status: Current Every Day Smoker    Packs/day: 0.50    Types: Cigarettes  . Smokeless tobacco: Never Used  . Tobacco comment: 11/19/18  Substance and Sexual Activity  . Alcohol use: No  . Drug use: No  . Sexual activity: Not on file

## 2019-07-09 ENCOUNTER — Ambulatory Visit: Payer: Medicaid Other | Admitting: Physician Assistant

## 2019-07-11 ENCOUNTER — Encounter (HOSPITAL_COMMUNITY): Payer: Self-pay | Admitting: Emergency Medicine

## 2019-07-11 ENCOUNTER — Other Ambulatory Visit: Payer: Self-pay

## 2019-07-11 ENCOUNTER — Emergency Department (HOSPITAL_COMMUNITY): Payer: Medicaid Other

## 2019-07-11 ENCOUNTER — Inpatient Hospital Stay (HOSPITAL_COMMUNITY)
Admission: EM | Admit: 2019-07-11 | Discharge: 2019-07-18 | DRG: 177 | Disposition: A | Discharge status: Skilled Nursing Facility | Payer: Medicaid Other | Attending: Family Medicine | Admitting: Family Medicine

## 2019-07-11 DIAGNOSIS — D509 Iron deficiency anemia, unspecified: Secondary | ICD-10-CM | POA: Diagnosis present

## 2019-07-11 DIAGNOSIS — M8660 Other chronic osteomyelitis, unspecified site: Secondary | ICD-10-CM | POA: Diagnosis present

## 2019-07-11 DIAGNOSIS — I130 Hypertensive heart and chronic kidney disease with heart failure and stage 1 through stage 4 chronic kidney disease, or unspecified chronic kidney disease: Secondary | ICD-10-CM | POA: Diagnosis present

## 2019-07-11 DIAGNOSIS — T8740 Infection of amputation stump, unspecified extremity: Secondary | ICD-10-CM | POA: Diagnosis present

## 2019-07-11 DIAGNOSIS — I272 Pulmonary hypertension, unspecified: Secondary | ICD-10-CM

## 2019-07-11 DIAGNOSIS — J1282 Pneumonia due to coronavirus disease 2019: Secondary | ICD-10-CM | POA: Diagnosis present

## 2019-07-11 DIAGNOSIS — U071 COVID-19: Secondary | ICD-10-CM | POA: Diagnosis not present

## 2019-07-11 DIAGNOSIS — J069 Acute upper respiratory infection, unspecified: Secondary | ICD-10-CM | POA: Diagnosis not present

## 2019-07-11 DIAGNOSIS — B9562 Methicillin resistant Staphylococcus aureus infection as the cause of diseases classified elsewhere: Secondary | ICD-10-CM | POA: Diagnosis present

## 2019-07-11 DIAGNOSIS — R7989 Other specified abnormal findings of blood chemistry: Secondary | ICD-10-CM | POA: Diagnosis not present

## 2019-07-11 DIAGNOSIS — D631 Anemia in chronic kidney disease: Secondary | ICD-10-CM | POA: Diagnosis present

## 2019-07-11 DIAGNOSIS — J9601 Acute respiratory failure with hypoxia: Secondary | ICD-10-CM | POA: Diagnosis present

## 2019-07-11 DIAGNOSIS — J449 Chronic obstructive pulmonary disease, unspecified: Secondary | ICD-10-CM | POA: Diagnosis not present

## 2019-07-11 DIAGNOSIS — Z89422 Acquired absence of other left toe(s): Secondary | ICD-10-CM

## 2019-07-11 DIAGNOSIS — F17210 Nicotine dependence, cigarettes, uncomplicated: Secondary | ICD-10-CM | POA: Diagnosis present

## 2019-07-11 DIAGNOSIS — E11649 Type 2 diabetes mellitus with hypoglycemia without coma: Secondary | ICD-10-CM | POA: Diagnosis not present

## 2019-07-11 DIAGNOSIS — N400 Enlarged prostate without lower urinary tract symptoms: Secondary | ICD-10-CM | POA: Diagnosis present

## 2019-07-11 DIAGNOSIS — N1832 Chronic kidney disease, stage 3b: Secondary | ICD-10-CM | POA: Diagnosis present

## 2019-07-11 DIAGNOSIS — E1169 Type 2 diabetes mellitus with other specified complication: Secondary | ICD-10-CM | POA: Diagnosis present

## 2019-07-11 DIAGNOSIS — E1122 Type 2 diabetes mellitus with diabetic chronic kidney disease: Secondary | ICD-10-CM | POA: Diagnosis present

## 2019-07-11 DIAGNOSIS — E785 Hyperlipidemia, unspecified: Secondary | ICD-10-CM | POA: Diagnosis present

## 2019-07-11 DIAGNOSIS — K219 Gastro-esophageal reflux disease without esophagitis: Secondary | ICD-10-CM | POA: Diagnosis present

## 2019-07-11 DIAGNOSIS — B9620 Unspecified Escherichia coli [E. coli] as the cause of diseases classified elsewhere: Secondary | ICD-10-CM | POA: Diagnosis present

## 2019-07-11 DIAGNOSIS — Z89511 Acquired absence of right leg below knee: Secondary | ICD-10-CM

## 2019-07-11 DIAGNOSIS — Z79899 Other long term (current) drug therapy: Secondary | ICD-10-CM

## 2019-07-11 DIAGNOSIS — E1165 Type 2 diabetes mellitus with hyperglycemia: Secondary | ICD-10-CM | POA: Diagnosis present

## 2019-07-11 DIAGNOSIS — F329 Major depressive disorder, single episode, unspecified: Secondary | ICD-10-CM | POA: Diagnosis present

## 2019-07-11 DIAGNOSIS — Z89412 Acquired absence of left great toe: Secondary | ICD-10-CM

## 2019-07-11 DIAGNOSIS — N1831 Chronic kidney disease, stage 3a: Secondary | ICD-10-CM

## 2019-07-11 DIAGNOSIS — Z794 Long term (current) use of insulin: Secondary | ICD-10-CM

## 2019-07-11 DIAGNOSIS — S78111A Complete traumatic amputation at level between right hip and knee, initial encounter: Secondary | ICD-10-CM

## 2019-07-11 DIAGNOSIS — Y835 Amputation of limb(s) as the cause of abnormal reaction of the patient, or of later complication, without mention of misadventure at the time of the procedure: Secondary | ICD-10-CM | POA: Diagnosis present

## 2019-07-11 DIAGNOSIS — Z833 Family history of diabetes mellitus: Secondary | ICD-10-CM

## 2019-07-11 DIAGNOSIS — Z85820 Personal history of malignant melanoma of skin: Secondary | ICD-10-CM

## 2019-07-11 DIAGNOSIS — G894 Chronic pain syndrome: Secondary | ICD-10-CM | POA: Diagnosis present

## 2019-07-11 DIAGNOSIS — R001 Bradycardia, unspecified: Secondary | ICD-10-CM | POA: Diagnosis not present

## 2019-07-11 DIAGNOSIS — Z791 Long term (current) use of non-steroidal anti-inflammatories (NSAID): Secondary | ICD-10-CM

## 2019-07-11 DIAGNOSIS — E1142 Type 2 diabetes mellitus with diabetic polyneuropathy: Secondary | ICD-10-CM | POA: Diagnosis present

## 2019-07-11 DIAGNOSIS — J440 Chronic obstructive pulmonary disease with acute lower respiratory infection: Secondary | ICD-10-CM | POA: Diagnosis present

## 2019-07-11 DIAGNOSIS — I5032 Chronic diastolic (congestive) heart failure: Secondary | ICD-10-CM | POA: Diagnosis present

## 2019-07-11 DIAGNOSIS — Z89611 Acquired absence of right leg above knee: Secondary | ICD-10-CM

## 2019-07-11 DIAGNOSIS — N183 Chronic kidney disease, stage 3 unspecified: Secondary | ICD-10-CM | POA: Diagnosis present

## 2019-07-11 DIAGNOSIS — D649 Anemia, unspecified: Secondary | ICD-10-CM | POA: Diagnosis present

## 2019-07-11 LAB — CBC WITH DIFFERENTIAL/PLATELET
Abs Immature Granulocytes: 0.03 10*3/uL (ref 0.00–0.07)
Basophils Absolute: 0 10*3/uL (ref 0.0–0.1)
Basophils Relative: 0 %
Eosinophils Absolute: 0 10*3/uL (ref 0.0–0.5)
Eosinophils Relative: 1 %
HCT: 28.4 % — ABNORMAL LOW (ref 39.0–52.0)
Hemoglobin: 8.2 g/dL — ABNORMAL LOW (ref 13.0–17.0)
Immature Granulocytes: 1 %
Lymphocytes Relative: 14 %
Lymphs Abs: 0.7 10*3/uL (ref 0.7–4.0)
MCH: 24.8 pg — ABNORMAL LOW (ref 26.0–34.0)
MCHC: 28.9 g/dL — ABNORMAL LOW (ref 30.0–36.0)
MCV: 85.8 fL (ref 80.0–100.0)
Monocytes Absolute: 0.2 10*3/uL (ref 0.1–1.0)
Monocytes Relative: 4 %
Neutro Abs: 4.2 10*3/uL (ref 1.7–7.7)
Neutrophils Relative %: 80 %
Platelets: 214 10*3/uL (ref 150–400)
RBC: 3.31 MIL/uL — ABNORMAL LOW (ref 4.22–5.81)
RDW: 17.9 % — ABNORMAL HIGH (ref 11.5–15.5)
WBC: 5.1 10*3/uL (ref 4.0–10.5)
nRBC: 0 % (ref 0.0–0.2)

## 2019-07-11 LAB — COMPREHENSIVE METABOLIC PANEL
ALT: 11 U/L (ref 0–44)
AST: 18 U/L (ref 15–41)
Albumin: 2.6 g/dL — ABNORMAL LOW (ref 3.5–5.0)
Alkaline Phosphatase: 80 U/L (ref 38–126)
Anion gap: 14 (ref 5–15)
BUN: 37 mg/dL — ABNORMAL HIGH (ref 6–20)
CO2: 22 mmol/L (ref 22–32)
Calcium: 8.2 mg/dL — ABNORMAL LOW (ref 8.9–10.3)
Chloride: 102 mmol/L (ref 98–111)
Creatinine, Ser: 2.09 mg/dL — ABNORMAL HIGH (ref 0.61–1.24)
GFR calc Af Amer: 39 mL/min — ABNORMAL LOW (ref >60)
GFR calc non Af Amer: 33 mL/min — ABNORMAL LOW (ref >60)
Glucose, Bld: 223 mg/dL — ABNORMAL HIGH (ref 70–99)
Potassium: 4.5 mmol/L (ref 3.5–5.1)
Sodium: 138 mmol/L (ref 135–145)
Total Bilirubin: 0.5 mg/dL (ref 0.3–1.2)
Total Protein: 6.9 g/dL (ref 6.5–8.1)

## 2019-07-11 LAB — BRAIN NATRIURETIC PEPTIDE: B Natriuretic Peptide: 74.8 pg/mL (ref 0.0–100.0)

## 2019-07-11 LAB — LACTIC ACID, PLASMA: Lactic Acid, Venous: 1.2 mmol/L (ref 0.5–1.9)

## 2019-07-11 LAB — D-DIMER, QUANTITATIVE: D-Dimer, Quant: 3.43 ug/mL-FEU — ABNORMAL HIGH (ref 0.00–0.50)

## 2019-07-11 LAB — LACTATE DEHYDROGENASE: LDH: 280 U/L — ABNORMAL HIGH (ref 98–192)

## 2019-07-11 LAB — FERRITIN: Ferritin: 87 ng/mL (ref 24–336)

## 2019-07-11 LAB — C-REACTIVE PROTEIN: CRP: 19.5 mg/dL — ABNORMAL HIGH (ref <1.0)

## 2019-07-11 LAB — FIBRINOGEN: Fibrinogen: 761 mg/dL — ABNORMAL HIGH (ref 210–475)

## 2019-07-11 LAB — PROCALCITONIN: Procalcitonin: 0.15 ng/mL

## 2019-07-11 LAB — POC SARS CORONAVIRUS 2 AG -  ED: SARS Coronavirus 2 Ag: POSITIVE — AB

## 2019-07-11 LAB — TRIGLYCERIDES: Triglycerides: 143 mg/dL (ref <150)

## 2019-07-11 MED ORDER — ACETAMINOPHEN 325 MG PO TABS
650.0000 mg | ORAL_TABLET | ORAL | Status: DC | PRN
  Filled 2019-07-11: qty 2

## 2019-07-11 MED ORDER — OXYBUTYNIN CHLORIDE ER 15 MG PO TB24
15.0000 mg | ORAL_TABLET | Freq: Every day | ORAL | Status: DC
  Administered 2019-07-11 – 2019-07-17 (×7): 15 mg via ORAL
  Filled 2019-07-11 (×8): qty 1

## 2019-07-11 MED ORDER — ATORVASTATIN CALCIUM 10 MG PO TABS
20.0000 mg | ORAL_TABLET | Freq: Every day | ORAL | Status: DC
  Administered 2019-07-11 – 2019-07-17 (×7): 20 mg via ORAL
  Filled 2019-07-11 (×7): qty 2

## 2019-07-11 MED ORDER — ASCORBIC ACID 500 MG PO TABS
500.0000 mg | ORAL_TABLET | Freq: Every day | ORAL | Status: DC
  Administered 2019-07-11 – 2019-07-18 (×8): 500 mg via ORAL
  Filled 2019-07-11 (×9): qty 1

## 2019-07-11 MED ORDER — METOPROLOL TARTRATE 50 MG PO TABS
50.0000 mg | ORAL_TABLET | Freq: Every day | ORAL | Status: DC
  Filled 2019-07-11 (×2): qty 1

## 2019-07-11 MED ORDER — TAMSULOSIN HCL 0.4 MG PO CAPS
0.4000 mg | ORAL_CAPSULE | Freq: Every evening | ORAL | Status: DC
  Administered 2019-07-11 – 2019-07-17 (×6): 0.4 mg via ORAL
  Filled 2019-07-11 (×6): qty 1

## 2019-07-11 MED ORDER — MENTHOL (TOPICAL ANALGESIC) 4 % EX GEL: 1.0000 "application " | CUTANEOUS | Status: DC | PRN

## 2019-07-11 MED ORDER — INSULIN ASPART 100 UNIT/ML ~~LOC~~ SOLN
0.0000 [IU] | SUBCUTANEOUS | Status: DC
  Administered 2019-07-11 (×2): 11 [IU] via SUBCUTANEOUS
  Administered 2019-07-12 (×3): 7 [IU] via SUBCUTANEOUS
  Administered 2019-07-12: 4 [IU] via SUBCUTANEOUS
  Administered 2019-07-13: 7 [IU] via SUBCUTANEOUS
  Administered 2019-07-14: 08:00:00 4 [IU] via SUBCUTANEOUS
  Administered 2019-07-14: 7 [IU] via SUBCUTANEOUS
  Administered 2019-07-14: 04:00:00 4 [IU] via SUBCUTANEOUS
  Administered 2019-07-14: 7 [IU] via SUBCUTANEOUS
  Administered 2019-07-14: 3 [IU] via SUBCUTANEOUS
  Administered 2019-07-14: 4 [IU] via SUBCUTANEOUS
  Administered 2019-07-15 (×2): 7 [IU] via SUBCUTANEOUS
  Administered 2019-07-15 (×2): 4 [IU] via SUBCUTANEOUS
  Administered 2019-07-15: 12:00:00 11 [IU] via SUBCUTANEOUS
  Administered 2019-07-15: 19:00:00 4 [IU] via SUBCUTANEOUS
  Administered 2019-07-16: 7 [IU] via SUBCUTANEOUS
  Administered 2019-07-16: 12:00:00 11 [IU] via SUBCUTANEOUS
  Administered 2019-07-16: 4 [IU] via SUBCUTANEOUS
  Administered 2019-07-16: 16:00:00 11 [IU] via SUBCUTANEOUS
  Administered 2019-07-16: 4 [IU] via SUBCUTANEOUS
  Administered 2019-07-16: 3 [IU] via SUBCUTANEOUS
  Administered 2019-07-17: 11 [IU] via SUBCUTANEOUS
  Administered 2019-07-17 (×2): 3 [IU] via SUBCUTANEOUS
  Administered 2019-07-17: 15 [IU] via SUBCUTANEOUS
  Administered 2019-07-17: 3 [IU] via SUBCUTANEOUS
  Administered 2019-07-17 (×2): 4 [IU] via SUBCUTANEOUS
  Administered 2019-07-18: 08:00:00 3 [IU] via SUBCUTANEOUS
  Administered 2019-07-18: 04:00:00 7 [IU] via SUBCUTANEOUS

## 2019-07-11 MED ORDER — PAROXETINE HCL 20 MG PO TABS
20.0000 mg | ORAL_TABLET | Freq: Every day | ORAL | Status: DC
  Administered 2019-07-11 – 2019-07-17 (×7): 20 mg via ORAL
  Filled 2019-07-11 (×8): qty 1

## 2019-07-11 MED ORDER — TIZANIDINE HCL 4 MG PO TABS
4.0000 mg | ORAL_TABLET | Freq: Three times a day (TID) | ORAL | Status: DC | PRN
  Administered 2019-07-11 – 2019-07-18 (×13): 4 mg via ORAL
  Filled 2019-07-11 (×19): qty 1

## 2019-07-11 MED ORDER — ENOXAPARIN SODIUM 60 MG/0.6ML ~~LOC~~ SOLN
55.0000 mg | SUBCUTANEOUS | Status: DC
  Administered 2019-07-11 – 2019-07-17 (×7): 55 mg via SUBCUTANEOUS
  Filled 2019-07-11 (×8): qty 0.6

## 2019-07-11 MED ORDER — INSULIN ASPART 100 UNIT/ML ~~LOC~~ SOLN
16.0000 [IU] | Freq: Three times a day (TID) | SUBCUTANEOUS | Status: DC
  Administered 2019-07-11 – 2019-07-12 (×3): 16 [IU] via SUBCUTANEOUS

## 2019-07-11 MED ORDER — ZINC SULFATE 220 (50 ZN) MG PO CAPS
220.0000 mg | ORAL_CAPSULE | Freq: Every day | ORAL | Status: DC
  Administered 2019-07-11 – 2019-07-18 (×8): 220 mg via ORAL
  Filled 2019-07-11 (×9): qty 1

## 2019-07-11 MED ORDER — DEXAMETHASONE 6 MG PO TABS: 6.0000 mg | ORAL_TABLET | ORAL | Status: DC

## 2019-07-11 MED ORDER — INSULIN LISPRO (1 UNIT DIAL) 100 UNIT/ML (KWIKPEN): 16.0000 [IU] | PEN_INJECTOR | Freq: Three times a day (TID) | SUBCUTANEOUS | Status: DC

## 2019-07-11 MED ORDER — INSULIN DETEMIR 100 UNIT/ML ~~LOC~~ SOLN
0.0750 [IU]/kg | Freq: Two times a day (BID) | SUBCUTANEOUS | Status: DC
  Administered 2019-07-11 – 2019-07-12 (×3): 8 [IU] via SUBCUTANEOUS
  Filled 2019-07-11 (×5): qty 0.08

## 2019-07-11 MED ORDER — HYDROCOD POLST-CPM POLST ER 10-8 MG/5ML PO SUER: 5.0000 mL | Freq: Two times a day (BID) | ORAL | Status: DC | PRN

## 2019-07-11 MED ORDER — POLYETHYLENE GLYCOL 3350 17 G PO PACK: 17.0000 g | PACK | Freq: Every day | ORAL | Status: DC | PRN

## 2019-07-11 MED ORDER — SODIUM CHLORIDE 0.9 % IV BOLUS
500.0000 mL | Freq: Once | INTRAVENOUS | Status: AC
  Administered 2019-07-11: 14:00:00 500 mL via INTRAVENOUS

## 2019-07-11 MED ORDER — LIDOCAINE 5 % EX PTCH
1.0000 | MEDICATED_PATCH | CUTANEOUS | Status: DC
  Administered 2019-07-14 – 2019-07-16 (×3): 1 via TRANSDERMAL
  Filled 2019-07-11 (×8): qty 1

## 2019-07-11 MED ORDER — CALCIUM CARBONATE ANTACID 500 MG PO CHEW
1.0000 | CHEWABLE_TABLET | Freq: Three times a day (TID) | ORAL | Status: DC
  Administered 2019-07-11 – 2019-07-18 (×18): 200 mg via ORAL
  Filled 2019-07-11 (×25): qty 1

## 2019-07-11 MED ORDER — MAGNESIUM OXIDE 400 MG PO TABS
400.0000 mg | ORAL_TABLET | Freq: Two times a day (BID) | ORAL | Status: DC
  Administered 2019-07-11 – 2019-07-17 (×12): 400 mg via ORAL
  Filled 2019-07-11 (×29): qty 1

## 2019-07-11 MED ORDER — AMLODIPINE BESYLATE 10 MG PO TABS
10.0000 mg | ORAL_TABLET | Freq: Every day | ORAL | Status: DC
  Administered 2019-07-12 – 2019-07-18 (×7): 10 mg via ORAL
  Filled 2019-07-11 (×9): qty 1

## 2019-07-11 MED ORDER — ALPRAZOLAM 0.25 MG PO TABS
0.2500 mg | ORAL_TABLET | Freq: Two times a day (BID) | ORAL | Status: DC | PRN
  Administered 2019-07-11 – 2019-07-18 (×13): 0.25 mg via ORAL
  Filled 2019-07-11 (×13): qty 1

## 2019-07-11 MED ORDER — LINAGLIPTIN 5 MG PO TABS
5.0000 mg | ORAL_TABLET | Freq: Every day | ORAL | Status: DC
  Administered 2019-07-12 – 2019-07-18 (×7): 5 mg via ORAL
  Filled 2019-07-11 (×9): qty 1

## 2019-07-11 MED ORDER — ALBUTEROL SULFATE HFA 108 (90 BASE) MCG/ACT IN AERS
2.0000 | INHALATION_SPRAY | Freq: Four times a day (QID) | RESPIRATORY_TRACT | Status: DC | PRN
  Administered 2019-07-11: 17:00:00 2 via RESPIRATORY_TRACT
  Filled 2019-07-11: qty 6.7

## 2019-07-11 MED ORDER — CALCIUM CARBONATE ANTACID 500 MG PO CHEW
2.0000 | CHEWABLE_TABLET | Freq: Three times a day (TID) | ORAL | Status: DC | PRN
  Administered 2019-07-14 – 2019-07-17 (×4): 400 mg via ORAL
  Filled 2019-07-11 (×6): qty 2

## 2019-07-11 MED ORDER — SODIUM CHLORIDE 0.9 % IV SOLN
100.0000 mg | Freq: Every day | INTRAVENOUS | Status: AC
  Administered 2019-07-12 – 2019-07-15 (×4): 100 mg via INTRAVENOUS
  Filled 2019-07-11 (×4): qty 20

## 2019-07-11 MED ORDER — MORPHINE SULFATE (PF) 4 MG/ML IV SOLN
4.0000 mg | Freq: Once | INTRAVENOUS | Status: AC
  Administered 2019-07-11: 11:00:00 4 mg via INTRAVENOUS
  Filled 2019-07-11: qty 1

## 2019-07-11 MED ORDER — MUSCLE RUB 10-15 % EX CREA
TOPICAL_CREAM | CUTANEOUS | Status: DC | PRN
  Filled 2019-07-11 (×2): qty 85

## 2019-07-11 MED ORDER — ENOXAPARIN SODIUM 40 MG/0.4ML ~~LOC~~ SOLN: 40.0000 mg | SUBCUTANEOUS | Status: DC

## 2019-07-11 MED ORDER — PREGABALIN 50 MG PO CAPS
300.0000 mg | ORAL_CAPSULE | Freq: Two times a day (BID) | ORAL | Status: DC
  Administered 2019-07-11 – 2019-07-18 (×15): 300 mg via ORAL
  Filled 2019-07-11 (×2): qty 3
  Filled 2019-07-11: qty 6
  Filled 2019-07-11 (×6): qty 3
  Filled 2019-07-11: qty 6
  Filled 2019-07-11 (×3): qty 3
  Filled 2019-07-11 (×2): qty 6

## 2019-07-11 MED ORDER — METHYLPREDNISOLONE SODIUM SUCC 40 MG IJ SOLR
40.0000 mg | Freq: Two times a day (BID) | INTRAMUSCULAR | Status: DC
  Administered 2019-07-11 – 2019-07-17 (×12): 40 mg via INTRAVENOUS
  Filled 2019-07-11 (×12): qty 1

## 2019-07-11 MED ORDER — TIOTROPIUM BROMIDE MONOHYDRATE 1.25 MCG/ACT IN AERS
2.0000 | INHALATION_SPRAY | Freq: Every day | RESPIRATORY_TRACT | Status: DC
  Administered 2019-07-11: 18:00:00 2 via RESPIRATORY_TRACT

## 2019-07-11 MED ORDER — UMECLIDINIUM BROMIDE 62.5 MCG/INH IN AEPB
1.0000 | INHALATION_SPRAY | Freq: Every day | RESPIRATORY_TRACT | Status: DC
  Administered 2019-07-11 – 2019-07-18 (×8): 1 via RESPIRATORY_TRACT
  Filled 2019-07-11: qty 7

## 2019-07-11 MED ORDER — BUPROPION HCL ER (XL) 300 MG PO TB24
300.0000 mg | ORAL_TABLET | Freq: Every day | ORAL | Status: DC
  Administered 2019-07-11 – 2019-07-17 (×7): 300 mg via ORAL
  Filled 2019-07-11 (×8): qty 1

## 2019-07-11 MED ORDER — FINASTERIDE 5 MG PO TABS
5.0000 mg | ORAL_TABLET | Freq: Every day | ORAL | Status: DC
  Administered 2019-07-13 – 2019-07-18 (×6): 5 mg via ORAL
  Filled 2019-07-11 (×8): qty 1

## 2019-07-11 MED ORDER — FLUTICASONE PROPIONATE 50 MCG/ACT NA SUSP
2.0000 | Freq: Every day | NASAL | Status: DC
  Administered 2019-07-12 – 2019-07-16 (×5): 2 via NASAL
  Filled 2019-07-11: qty 16

## 2019-07-11 MED ORDER — ONDANSETRON HCL 4 MG/2ML IJ SOLN
4.0000 mg | Freq: Once | INTRAMUSCULAR | Status: AC
  Administered 2019-07-11: 4 mg via INTRAVENOUS
  Filled 2019-07-11: qty 2

## 2019-07-11 MED ORDER — TERBINAFINE HCL 250 MG PO TABS
250.0000 mg | ORAL_TABLET | Freq: Every day | ORAL | Status: DC
  Administered 2019-07-12 – 2019-07-18 (×7): 250 mg via ORAL
  Filled 2019-07-11 (×7): qty 1

## 2019-07-11 MED ORDER — SODIUM CHLORIDE 0.9 % IV SOLN
200.0000 mg | Freq: Once | INTRAVENOUS | Status: AC
  Administered 2019-07-11: 14:00:00 200 mg via INTRAVENOUS
  Filled 2019-07-11: qty 40

## 2019-07-11 MED ORDER — SODIUM CHLORIDE 0.9% IV SOLUTION: Freq: Once | INTRAVENOUS | Status: AC

## 2019-07-11 MED ORDER — DEXAMETHASONE SODIUM PHOSPHATE 10 MG/ML IJ SOLN
10.0000 mg | Freq: Once | INTRAMUSCULAR | Status: AC
  Administered 2019-07-11: 11:00:00 10 mg via INTRAVENOUS
  Filled 2019-07-11: qty 1

## 2019-07-11 MED ORDER — BISACODYL 10 MG RE SUPP: 10.0000 mg | Freq: Every day | RECTAL | Status: DC | PRN

## 2019-07-11 MED ORDER — SENNOSIDES-DOCUSATE SODIUM 8.6-50 MG PO TABS
2.0000 | ORAL_TABLET | Freq: Every day | ORAL | Status: DC
  Administered 2019-07-11 – 2019-07-17 (×7): 2 via ORAL
  Filled 2019-07-11 (×8): qty 2

## 2019-07-11 MED ORDER — OXYCODONE HCL 5 MG PO TABS
20.0000 mg | ORAL_TABLET | ORAL | Status: DC | PRN
  Administered 2019-07-11 – 2019-07-18 (×34): 20 mg via ORAL
  Filled 2019-07-11 (×35): qty 4

## 2019-07-11 MED ORDER — ONDANSETRON HCL 4 MG PO TABS
4.0000 mg | ORAL_TABLET | Freq: Four times a day (QID) | ORAL | Status: DC | PRN
  Administered 2019-07-17: 4 mg via ORAL
  Filled 2019-07-11: qty 1

## 2019-07-11 MED ORDER — CLINDAMYCIN HCL 300 MG PO CAPS
300.0000 mg | ORAL_CAPSULE | Freq: Three times a day (TID) | ORAL | Status: DC
  Administered 2019-07-11 (×2): 300 mg via ORAL
  Filled 2019-07-11 (×4): qty 1

## 2019-07-11 MED ORDER — CEPHALEXIN 500 MG PO CAPS
500.0000 mg | ORAL_CAPSULE | Freq: Three times a day (TID) | ORAL | Status: DC
  Administered 2019-07-11 (×2): 500 mg via ORAL
  Filled 2019-07-11 (×4): qty 1

## 2019-07-11 MED ORDER — MIRABEGRON ER 50 MG PO TB24
50.0000 mg | ORAL_TABLET | Freq: Every day | ORAL | Status: DC
  Administered 2019-07-13 – 2019-07-18 (×6): 50 mg via ORAL
  Filled 2019-07-11 (×8): qty 1

## 2019-07-11 MED ORDER — PANTOPRAZOLE SODIUM 40 MG PO TBEC
40.0000 mg | DELAYED_RELEASE_TABLET | Freq: Every day | ORAL | Status: DC
  Administered 2019-07-12 – 2019-07-18 (×7): 40 mg via ORAL
  Filled 2019-07-11 (×9): qty 1

## 2019-07-11 MED ORDER — ONDANSETRON HCL 4 MG/2ML IJ SOLN: 4.0000 mg | Freq: Four times a day (QID) | INTRAMUSCULAR | Status: DC | PRN

## 2019-07-11 MED ORDER — GUAIFENESIN-DM 100-10 MG/5ML PO SYRP: 10.0000 mL | ORAL_SOLUTION | ORAL | Status: DC | PRN

## 2019-07-11 MED ORDER — ZOLPIDEM TARTRATE 5 MG PO TABS
5.0000 mg | ORAL_TABLET | Freq: Every evening | ORAL | Status: DC | PRN
  Administered 2019-07-11 – 2019-07-16 (×3): 5 mg via ORAL
  Filled 2019-07-11 (×3): qty 1

## 2019-07-11 NOTE — ED Triage Notes (Signed)
Pt BIB [facility] from [facility]. Per facility pt SpO2 was low yesterday afternoon in the 80's on 4L [place]. Per facility this morning pt was 5L [place] and SpO2 was 78%. Pt placed on NRB by EMS and SpO2 up to 95%. Pt Covid positive 07/08/2019. Pt not complaining of feeling SOB. Pt with history of COPD. VSS. NAD.

## 2019-07-11 NOTE — ED Notes (Signed)
Pt requesting ice cubes. Informed pt that Thurmond Butts, RN will be bringing them with his medicine. Pt sts "he needs to hurry" This staff member said ok and walked away

## 2019-07-11 NOTE — Progress Notes (Signed)
Ok to increase his lovenox to 0.5mg /kg/day per Dr Cruzita Lederer due to his wt.  Onnie Boer, PharmD, BCIDP, AAHIVP, CPP Infectious Disease Pharmacist 07/11/2019 4:43 PM

## 2019-07-11 NOTE — H&P (Signed)
History and Physical  DREAN REMME [name] DOB: 11-30-1960 DOA: 07/11/2019  Referring physician: Dr Laverta Baltimore, ED physician PCP: [name], MD  Outpatient Specialists: Dr [name] (ortho)  Patient Coming From: [place]  Chief Complaint: Shortness of breath  HPI: Gregory [name] is a 59 y.o. male with a history of type 2 diabetes, status post right lower extremity amputation with multiple revisions leading to an above-the-knee amputation, hypertension, COPD, chronic pain syndrome, hyperlipidemia, GERD.  He has been hospitalized multiple times since December, all related to chronic osteomyelitis and chronic wound infection.  He is at [facility].  He states that there are multiple cases of Covid at the facility.  He started having shortness of breath for the past 5 days that has been increasing.  He has had worsening oxygen saturations, worsening dyspnea on exertion, worsening dyspnea at rest.  She was placed on 4 L nasal cannula, but satting in the 80s.  EMS was called and patient was placed on a nonrebreather with oxygen saturations in the 90s.  No other palliating or provoking factors.  Emergency Department Course: Patient Covid positive with chest x-ray consistent with Covid pneumonia and multifocal airway disease.  Review of Systems:   Pt denies any fevers, chills, nausea, vomiting, diarrhea, constipation, abdominal pain, palpitations, headache, vision changes, lightheadedness, dizziness, melena, rectal bleeding.  Review of systems are otherwise negative  Past Medical History:  Diagnosis Date  . Acquired absence of left great toe ([place])   . Acquired absence of right leg below knee ([place])   . Acute osteomyelitis of calcaneum, right ([place]) 10/02/2016  . Acute respiratory failure ([place])   . Amputation stump infection ([place]) 08/12/2018  . Anemia   . Basal cell carcinoma, eyelid   . Cancer ([place])   . Candidiasis 08/12/2018  . CHF (congestive heart failure)  (HCC)    chronic diastolic   . Chronic back pain   . Chronic kidney disease    stage 3   . Chronic multifocal osteomyelitis (HCC)    of ankle and foot   . Chronic pain syndrome   . COPD (chronic obstructive pulmonary disease) ([place])   . DDD (degenerative disc disease), lumbar   . Depression    major depressive disorder   . Diabetes mellitus without complication ([place])    type 2   . Diabetic ulcer of toe of left foot ([place]) 10/02/2016  . Difficulty in walking, not elsewhere classified   . 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 Foot drop, bilateral   . Foot drop, right 12/27/2015  . GERD (gastroesophageal reflux disease)   . Herpesviral vesicular dermatitis   . Hyperlipidemia   . Hyperlipidemia   . Hypertension   . Insomnia   . Malignant melanoma of other parts of face ([place])   . MRSA bacteremia   . Muscle weakness (generalized)   . Nasal congestion   . Necrosis of toe ([place]) 07/08/2018  . Neuromuscular dysfunction of bladder   . Osteomyelitis ([place])   . Osteomyelitis ([place])   . Osteomyelitis ([place])    right BKA  . Other idiopathic peripheral autonomic neuropathy   . Retention of urine, unspecified   . Squamous cell carcinoma of skin   . Unsteadiness on feet   . Urinary retention   . Urinary retention   . Urinary tract infection   . Wears dentures   . Wears glasses    Past Surgical History:  Procedure Laterality Date  . AMPUTATION Left 03/29/2017   Procedure: LEFT  GREAT TOE AMPUTATION AT METATARSOPHALANGEAL JOINT;  Surgeon: [name], MD;  Location: [facility];  Service: Orthopedics;  Laterality: Left;  . AMPUTATION Right 03/29/2017   Procedure: RIGHT BELOW KNEE AMPUTATION;  Surgeon: [name], MD;  Location: [facility];  Service: Orthopedics;  Laterality: Right;  . AMPUTATION Left 06/06/2018   Procedure: LEFT 2ND TOE AMPUTATION;  Surgeon: [name], MD;  Location: [facility];  Service: Orthopedics;  Laterality: Left;  . AMPUTATION Right 11/19/2018   Procedure: AMPUTATION ABOVE KNEE;   Surgeon: [name], MD;  Location: [facility];  Service: Orthopedics;  Laterality: Right;  . ANTERIOR CERVICAL CORPECTOMY N/A 11/25/2015   Procedure: ANTERIOR CERVICAL FIVE CORPECTOMY Cervical four - six fusion;  Surgeon: Consuella Lose, MD;  Location: [facility];  Service: Neurosurgery;  Laterality: N/A;  ANTERIOR CERVICAL FIVE CORPECTOMY Cervical four - six fusion  . APPLICATION OF WOUND VAC Right 10/22/2018   Procedure: Application Of Wound Vac;  Surgeon: [name], MD;  Location: [facility];  Service: Orthopedics;  Laterality: Right;  . CYSTOSCOPY WITH RETROGRADE URETHROGRAM N/A 04/25/2018   Procedure: CYSTOSCOPY WITH RETROGRADE URETHROGRAM/ BALLOON DILATION;  Surgeon: Kathie Rhodes, MD;  Location: [facility];  Service: Urology;  Laterality: N/A;  . MULTIPLE TOOTH EXTRACTIONS    . POSTERIOR CERVICAL FUSION/FORAMINOTOMY N/A 11/29/2015   Procedure: Cervical Three-Cervical Seven Posterior Cervical Laminectomy with Fusion;  Surgeon: Consuella Lose, MD;  Location: [facility];  Service: Neurosurgery;  Laterality: N/A;  Cervical Three-Cervical Seven Posterior Cervical Laminectomy with Fusion  . STUMP REVISION Right 10/22/2018   Procedure: REVISION RIGHT BELOW KNEE AMPUTATION;  Surgeon: [name], MD;  Location: [facility] [place];  Service: Orthopedics;  Laterality: Right;  . STUMP REVISION Right 12/05/2018   Procedure: Revision Right Above Knee Amputation;  Surgeon: [name], MD;  Location: [facility];  Service: Orthopedics;  Laterality: Right;  . STUMP REVISION Right 05/29/2019   Procedure: REVISION RIGHT ABOVE KNEE AMPUTATION;  Surgeon: [name], MD;  Location: [facility];  Service: Orthopedics;  Laterality: Right;  . STUMP REVISION Right 06/24/2019   Procedure: STUMP REVISION;  Surgeon: [name], MD;  Location: [facility];  Service: Orthopedics;  Laterality: Right;   Social History:  reports that he has been smoking cigarettes. He has been smoking about 0.50 packs per day. He has never used smokeless  tobacco. He reports that he does not drink alcohol or use drugs. Patient lives at home  Allergies  Allergen Reactions  . Latex Other (See Comments)    Listed on MAR- pt sts he is not allergic.    Family History  Problem Relation Age of Onset  . Cancer Other   . Diabetes Other       Prior to Admission medications   Medication Sig Start Date End Date Taking? Authorizing Provider  acetaminophen (TYLENOL) 325 MG tablet Take 650 mg by mouth every 4 (four) hours as needed (general discomfort).     [provider]  albuterol (PROVENTIL HFA;VENTOLIN HFA) 108 (90 Base) MCG/ACT inhaler Inhale 2 puffs into the lungs every 6 (six) hours as needed for wheezing or shortness of breath.    [provider]  ALPRAZolam Duanne Moron) 0.25 MG tablet Take 1 tablet (0.25 mg total) by mouth 2 (two) times daily as needed for anxiety. 06/27/19   Mikhail, Velta Addison, DO  amLODipine (NORVASC) 10 MG tablet Take 10 mg by mouth daily. for HTN    [provider]  atorvastatin (LIPITOR) 20  MG tablet Take 20 mg by mouth at bedtime.    [provider]  Baclofen 5 MG TABS Take 10 mg by mouth 3 (three) times daily.    [provider]  bisacodyl (DULCOLAX) 10 MG suppository Place 1 suppository (10 mg total) rectally daily as needed for moderate constipation. 12/02/15   [name], MD  buPROPion (WELLBUTRIN XL) 300 MG 24 hr tablet Take 300 mg by mouth at bedtime.    [provider]  calcium carbonate (TUMS - DOSED IN MG ELEMENTAL CALCIUM) 500 MG chewable tablet Chew 2 tablets by mouth every 8 (eight) hours as needed for indigestion or heartburn.    [provider]  cephALEXin (KEFLEX) 500 MG capsule Take 1 capsule (500 mg total) by mouth 3 (three) times daily for 42 doses. 06/27/19 07/11/19  Cristal Ford, DO  cetirizine (ZYRTEC) 10 MG tablet Take 10 mg by mouth daily.    [provider]  CETYLPYRIDINIUM CHLORIDE MT Use as directed 15 mLs in the mouth or throat every 4  (four) hours as needed (dry mouth).    [provider]  Cholecalciferol (VITAMIN D-3) 5000 units TABS Take 5,000 Units by mouth daily.    [provider]  clindamycin (CLEOCIN) 300 MG capsule Take 1 capsule (300 mg total) by mouth 3 (three) times daily for 21 days. 06/27/19 07/18/19  Cristal Ford, DO  docusate sodium (COLACE) 100 MG capsule Take 1 capsule (100 mg total) by mouth 2 (two) times daily. 05/31/19   Amin, Ankit Chirag, MD  eszopiclone (LUNESTA) 1 MG TABS tablet Take 1 mg by mouth at bedtime. Take immediately before bedtime    [provider]  ferrous sulfate 325 (65 FE) MG tablet Take 1 tablet (325 mg total) by mouth daily with breakfast. 11/23/18   Matilde Haymaker, MD  finasteride (PROSCAR) 5 MG tablet Take 5 mg by mouth daily.    [provider]  furosemide (LASIX) 40 MG tablet Take 40 mg by mouth daily.    [provider]  glipiZIDE (GLUCOTROL) 10 MG tablet Take 10 mg by mouth daily before breakfast.    [provider]  glucagon (GLUCAGON EMERGENCY) 1 MG injection Inject 1 mg into the muscle once as needed (for CBG <70).     [provider]  ibuprofen (ADVIL) 800 MG tablet Take 800 mg by mouth 3 (three) times daily.    [provider]  insulin lispro (HUMALOG KWIKPEN) 100 UNIT/ML KwikPen Inject 16 Units into the skin 3 (three) times daily before meals.     [provider]  lidocaine (LIDODERM) 5 % Place 1 patch onto the skin daily. Apply to Right stump. Remove & Discard patch within 12 hours or as directed by MD    [provider]  LYRICA 300 MG capsule Take 300 mg by mouth 2 (two) times daily. 04/20/19   [provider]  magnesium oxide (MAG-OX) 400 MG tablet Take 400 mg by mouth 2 (two) times daily.    [provider]  Menthol, Topical Analgesic, (BIOFREEZE) 4 % GEL Apply 1 application topically every 4 (four) hours as needed (pain). Apply to right stump    [provider]    metoprolol tartrate (LOPRESSOR) 50 MG tablet Take 50 mg by mouth daily.     [provider]  mirabegron ER (MYRBETRIQ) 50 MG TB24 tablet Take 50 mg by mouth daily.    [provider]  mometasone (NASONEX) 50 MCG/ACT nasal spray Place 2 sprays into  the nose at bedtime.     [provider]  nicotine (NICODERM CQ - DOSED IN MG/24 HOURS) 14 mg/24hr patch Place 1 patch (14 mg total) onto the skin at bedtime. 06/26/19   Persons, Bevely Palmer, PA  omeprazole (PRILOSEC) 20 MG capsule Take 40 mg by mouth daily before breakfast.     [provider]  ondansetron (ZOFRAN) 4 MG tablet Take 4 mg by mouth every 6 (six) hours as needed for nausea or vomiting.    [provider]  oxybutynin (DITROPAN XL) 15 MG 24 hr tablet Take 15 mg by mouth at bedtime.    [provider]  oxyCODONE (OXY IR/ROXICODONE) 5 MG immediate release tablet Take 1 tablet (5 mg total) by mouth every 4 (four) hours as needed for severe pain. 06/26/19   Persons, Bevely Palmer, PA  oxyCODONE (OXY IR/ROXICODONE) 5 MG immediate release tablet Take 1 tablet (5 mg total) by mouth every 6 (six) hours as needed for moderate pain. 06/26/19   Persons, Bevely Palmer, PA  oxyCODONE 20 MG TABS Take 1 tablet (20 mg total) by mouth every 4 (four) hours as needed for severe pain (pain). 06/26/19   Persons, Bevely Palmer, PA  oxyCODONE-acetaminophen (PERCOCET/ROXICET) 5-325 MG tablet Take 1 tablet by mouth every 6 (six) hours as needed for moderate pain. 06/26/19   Persons, Bevely Palmer, PA  PARoxetine (PAXIL) 20 MG tablet Take 20 mg by mouth at bedtime.     [provider]  polyethylene glycol (MIRALAX / GLYCOLAX) packet Take 17 g by mouth daily as needed for mild constipation. 06/09/18   Shelly Coss, MD  senna-docusate (SENOKOT-S) 8.6-50 MG tablet Take 2 tablets by mouth at bedtime.    [provider]  tamsulosin (FLOMAX) 0.4 MG CAPS capsule Take 0.4 mg by mouth every evening.  11/26/13   [provider]  terbinafine (LAMISIL) 250 MG tablet Take 250 mg by mouth daily.    [provider]  Tiotropium Bromide Monohydrate (SPIRIVA RESPIMAT) 1.25 MCG/ACT AERS Inhale 2 puffs into the lungs daily.    [provider]  tiZANidine (ZANAFLEX) 4 MG tablet Take 1 tablet (4 mg total) by mouth every 8 (eight) hours as needed for muscle spasms. 06/27/19   Mikhail, Velta Addison, DO  vitamin B-12 (CYANOCOBALAMIN) 500 MCG tablet Take 1,000 mcg by mouth daily.     [provider]    Physical Exam: BP 114/69 (BP Location: Right Arm)   Pulse 93   Temp 98.6 F (37 C) (Oral)   Resp 20   SpO2 95%   . General: Elderly male. Awake and alert and oriented x3. No acute cardiopulmonary distress.  Marland Kitchen HEENT: Normocephalic atraumatic.  Right and left ears normal in appearance.  Pupils equal, round, reactive to light. Extraocular muscles are intact. Sclerae anicteric and noninjected.  Moist mucosal membranes. No mucosal lesions.  . Neck: Neck supple without lymphadenopathy. No carotid bruits. No masses palpated.  . Cardiovascular: Regular rate with normal S1-S2 sounds. No murmurs, rubs, gallops auscultated. No JVD.  Marland Kitchen Respiratory: Prolonged exhalation phase.  No wheezing.  Rales diffusely.  Poor air movement.  No accessory muscle use. . Abdomen: Soft, nontender, nondistended. Active bowel sounds. No masses or hepatosplenomegaly  . Skin: No rashes, lesions, or ulcerations.  Dry, warm to touch. 2+ dorsalis pedis and radial pulses. . Musculoskeletal: Right AKA.  Stump has no erythema or purulent drainage.  He does have a healing incision no calf or leg pain. All major joints not erythematous  nontender.  No upper or lower joint deformation.  Good ROM.  No contractures  . Psychiatric: Intact judgment and insight. Pleasant and cooperative. . Neurologic: No focal neurological deficits. Strength is 5/5 and symmetric in upper and lower extremities.  Cranial nerves II through XII are grossly intact.            Labs on Admission: I have personally reviewed following labs and imaging studies  CBC: Recent Labs  Lab 07/11/19 1023  WBC 5.1  NEUTROABS 4.2  HGB 8.2*  HCT 28.4*  MCV 85.8  PLT [id number]   Basic Metabolic Panel: Recent Labs  Lab 07/11/19 1023  NA 138  K 4.5  CL 102  CO2 22  GLUCOSE 223*  BUN 37*  CREATININE 2.09*  CALCIUM 8.2*   GFR: Estimated Creatinine Clearance: 48.6 mL/min (A) (by C-G formula based on SCr of 2.09 mg/dL (H)). Liver Function Tests: Recent Labs  Lab 07/11/19 1023  AST 18  ALT 11  ALKPHOS 80  BILITOT 0.5  PROT 6.9  ALBUMIN 2.6*   No results for input(s): LIPASE, AMYLASE in the last 168 hours. No results for input(s): AMMONIA in the last 168 hours. Coagulation Profile: No results for input(s): INR, PROTIME in the last 168 hours. Cardiac Enzymes: No results for input(s): CKTOTAL, CKMB, CKMBINDEX, TROPONINI in the last 168 hours. BNP (last 3 results) No results for input(s): PROBNP in the last 8760 hours. HbA1C: No results for input(s): HGBA1C in the last 72 hours. CBG: No results for input(s): GLUCAP in the last 168 hours. Lipid Profile: Recent Labs    07/11/19 1023  TRIG 143   Thyroid Function Tests: No results for input(s): TSH, T4TOTAL, FREET4, T3FREE, THYROIDAB in the last 72 hours. Anemia Panel: Recent Labs    07/11/19 1045  FERRITIN 87   Urine analysis:    Component Value Date/Time   COLORURINE YELLOW 06/22/2019 2053   APPEARANCEUR CLEAR 06/22/2019 2053   LABSPEC 1.014 06/22/2019 2053   PHURINE 5.0 06/22/2019 2053   GLUCOSEU NEGATIVE 06/22/2019 2053   HGBUR NEGATIVE 06/22/2019 2053   [place] NEGATIVE 06/22/2019 2053   [place] NEGATIVE 06/22/2019 2053   PROTEINUR 30 (A) 06/22/2019 2053   UROBILINOGEN 1.0 02/03/2014 0052   NITRITE NEGATIVE 06/22/2019 2053   LEUKOCYTESUR NEGATIVE 06/22/2019 2053   Sepsis Labs: @LABRCNTIP (procalcitonin:4,lacticidven:4) )No results found for this or any previous visit (from the  past 240 hour(s)).   Radiological Exams on Admission: DG Chest Port 1 View  Result Date: 07/11/2019 CLINICAL DATA:  COVID positive, shortness of breath. Hypoxemia. EXAM: PORTABLE CHEST 1 VIEW COMPARISON:  Chest x-ray dated 05/22/2019. FINDINGS: Ill-defined airspace opacities bilaterally, RIGHT greater than LEFT. No pleural effusion or pneumothorax is seen. Borderline cardiomegaly is stable. Osseous structures about the chest are unremarkable. IMPRESSION: Multifocal pneumonia, RIGHT greater than LEFT. Electronically Signed   By: Franki Cabot M.D.   On: 07/11/2019 10:50    EKG: Independently reviewed.  Sinus rhythm with right axis deviation.  No acute ST changes.  Assessment/Plan: Principal Problem:   Acute respiratory failure with hypoxia (HCC) Active Problems:   COPD (chronic obstructive pulmonary disease) (HCC)   Pulmonary hypertension (HCC)   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HCC)   Above knee amputation of right lower extremity (HCC)   Diabetic polyneuropathy associated with type 2 diabetes mellitus ([place])   CKD (chronic kidney disease), stage III   Acute respiratory disease due to COVID-19 virus    This patient was discussed with the ED physician, including pertinent vitals,  physical exam findings, labs, and imaging.  We also discussed care given by the ED provider.  1. Acute respiratory failure with hypoxia 2. Acute respiratory disease due to COVID-19 a. Admit to [facility] b. Continue remdesivir and dexamethasone c. Vitamin C, and zinc d. CBC, CMP, CRP, D-dimer, ferritin, magnesium daily 3. COPD a. Continue spiriva and albuterol.  4. Chronic hypertension a. Continue antihypertensives 5. Pulmonary hypertension 6. Chronic infection of amputation stump a. Continue clindamycin and Keflex 7. AKA 8. Diabetes a. Continue premeal insulin b. Hold oral hypoglycemic c. Start Tradjenta as this is been shown to improve outcomes in diabetic patients d. Start sliding scale  insulin and long-acting insulin 9. Chronic kidney disease a. Creatinine higher than on baseline b. We will bolus 500 mL of fluid and recheck  DVT prophylaxis: lovenox Consultants: none Code Status: full Family Communication: none  Disposition Plan: Pending   Truett Mainland, DO

## 2019-07-11 NOTE — ED Provider Notes (Signed)
Emergency Department Provider Note   I have reviewed the triage vital signs and the nursing notes.   HISTORY  Chief Complaint Covid/SOB   HPI Gregory [name] is a 59 y.o. male with past history of right AKA, CHF, COPD, and recent COVID 19 diagnosis presents the emergency department for evaluation of worsening hypoxemia and shortness of breath.  Patient describes some "mild" SOB at this time without chest pain.  2 weeks ago the patient had right stump infection/cellulitis requiring surgical debridement.  Patient describes continued pain in this area with some drainage but denies bleeding.  He has been feeling worse in terms of his Covid symptoms over the past 5 days.  Per facility staff the patient was on 5 L nasal cannula and satting 78%.  Hypoxemia noted yesterday and initially managed there but with worsening oxygen saturation EMS was called EMS transition to nonrebreather in route and patient's sats have been in the mid 90s per crew.  Patient does report very dry mouth.   Past Medical History:  Diagnosis Date  . Acquired absence of left great toe ([place])   . Acquired absence of right leg below knee ([place])   . Acute osteomyelitis of calcaneum, right ([place]) 10/02/2016  . Acute respiratory failure ([place])   . Amputation stump infection ([place]) 08/12/2018  . Anemia   . Basal cell carcinoma, eyelid   . Cancer ([place])   . Candidiasis 08/12/2018  . CHF (congestive heart failure) (HCC)    chronic diastolic   . Chronic back pain   . Chronic kidney disease    stage 3   . Chronic multifocal osteomyelitis (HCC)    of ankle and foot   . Chronic pain syndrome   . COPD (chronic obstructive pulmonary disease) ([place])   . DDD (degenerative disc disease), lumbar   . Depression    major depressive disorder   . Diabetes mellitus without complication ([place])    type 2   . Diabetic ulcer of toe of left foot ([place]) 10/02/2016  . Difficulty in walking, not elsewhere classified   . 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  Foot drop, bilateral   . Foot drop, right 12/27/2015  . GERD (gastroesophageal reflux disease)   . Herpesviral vesicular dermatitis   . Hyperlipidemia   . Hyperlipidemia   . Hypertension   . Insomnia   . Malignant melanoma of other parts of face ([place])   . MRSA bacteremia   . Muscle weakness (generalized)   . Nasal congestion   . Necrosis of toe ([place]) 07/08/2018  . Neuromuscular dysfunction of bladder   . Osteomyelitis ([place])   . Osteomyelitis ([place])   . Osteomyelitis ([place])    right BKA  . Other idiopathic peripheral autonomic neuropathy   . Retention of urine, unspecified   . Squamous cell carcinoma of skin   . Unsteadiness on feet   . Urinary retention   . Urinary retention   . Urinary tract infection   . Wears dentures   . Wears glasses     Patient Active Problem List   Diagnosis Date Noted  . Acute respiratory failure with hypoxia ([place]) 07/11/2019  . Acute respiratory disease due to COVID-19 virus 07/11/2019  . Wound infection 06/22/2019  . Abscess 06/22/2019  . CKD (chronic kidney disease), stage III 05/23/2019  . Tobacco use 05/23/2019  . Sepsis ([place]) 05/23/2019  . S/P AKA (above knee amputation) unilateral, right ([place]) 12/05/2018  . Amputation stump infection ([place])   . Lymphedema of left leg   .  Diabetic polyneuropathy associated with type 2 diabetes mellitus ([place])   . Above knee amputation of right lower extremity ([place]) 10/22/2018  . Dehiscence of amputation stump ([place])   . 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[place]) 08/12/2018  . Candidiasis 08/12/2018  . Necrosis of toe ([place]) 07/08/2018  . Amputated toe of left foot ([place]) 06/18/2018  . Streptococcal infection   . Pressure injury of skin 06/06/2018  . Cutaneous abscess of left foot   . Moderate protein-calorie malnutrition ([place])   . Cellulitis of left leg   . Septic arthritis ([place]) 06/03/2018  . Idiopathic chronic venous hypertension of left lower extremity with ulcer and inflammation ([place]) 11/14/2017  .  Great toe amputation status, left 11/14/2017  . Osteomyelitis ([place]) 03/25/2017  . Skin cancer 03/25/2017  . Depression, recurrent ([place]) 11/05/2016  . Diabetic ulcer of toe of left foot ([place]) 10/02/2016  . 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 Chronic pain 09/27/2016  . Dyslipidemia associated with type 2 diabetes mellitus ([place]) 06/06/2016  . GERD without esophagitis 05/11/2016  . Hypertensive heart disease with congestive heart failure ([place]) 12/15/2015  . Spinal stenosis in cervical region 12/15/2015  . Type II diabetes mellitus with neurological manifestations ([place]) 12/15/2015  . Chronic diastolic CHF (congestive heart failure) ([place])   . Pulmonary hypertension ([place])   . Urinary retention   . Diskitis   . Foot drop, bilateral   . Sepsis with acute renal failure without septic shock ([place])   . MRSA bacteremia 10/03/2015  . COPD (chronic obstructive pulmonary disease) ([place]) 10/03/2015  . Acute osteomyelitis of left foot ([place]) 10/03/2015    Past Surgical History:  Procedure Laterality Date  . AMPUTATION Left 03/29/2017   Procedure: LEFT GREAT TOE AMPUTATION AT METATARSOPHALANGEAL JOINT;  Surgeon: [name], MD;  Location: [facility];  Service: Orthopedics;  Laterality: Left;  . AMPUTATION Right 03/29/2017   Procedure: RIGHT BELOW KNEE AMPUTATION;  Surgeon: [name], MD;  Location: [facility];  Service: Orthopedics;  Laterality: Right;  . AMPUTATION Left 06/06/2018   Procedure: LEFT 2ND TOE AMPUTATION;  Surgeon: [name], MD;  Location: [facility];  Service: Orthopedics;  Laterality: Left;  . AMPUTATION Right 11/19/2018   Procedure: AMPUTATION ABOVE KNEE;  Surgeon: [name], MD;  Location: [facility];  Service: Orthopedics;  Laterality: Right;  . ANTERIOR CERVICAL CORPECTOMY N/A 11/25/2015   Procedure: ANTERIOR CERVICAL FIVE CORPECTOMY Cervical four - six fusion;  Surgeon: Consuella Lose, MD;  Location: [facility];  Service: Neurosurgery;  Laterality: N/A;  ANTERIOR CERVICAL FIVE CORPECTOMY  Cervical four - six fusion  . APPLICATION OF WOUND VAC Right 10/22/2018   Procedure: Application Of Wound Vac;  Surgeon: [name], MD;  Location: [facility];  Service: Orthopedics;  Laterality: Right;  . CYSTOSCOPY WITH RETROGRADE URETHROGRAM N/A 04/25/2018   Procedure: CYSTOSCOPY WITH RETROGRADE URETHROGRAM/ BALLOON DILATION;  Surgeon: Kathie Rhodes, MD;  Location: [facility];  Service: Urology;  Laterality: N/A;  . MULTIPLE TOOTH EXTRACTIONS    . POSTERIOR CERVICAL FUSION/FORAMINOTOMY N/A 11/29/2015   Procedure: Cervical Three-Cervical Seven Posterior Cervical Laminectomy with Fusion;  Surgeon: Consuella Lose, MD;  Location: [facility];  Service: Neurosurgery;  Laterality: N/A;  Cervical Three-Cervical Seven Posterior Cervical Laminectomy with Fusion  . STUMP REVISION Right 10/22/2018   Procedure: REVISION RIGHT BELOW KNEE AMPUTATION;  Surgeon: [name], MD;  Location: [facility];  Service: Orthopedics;  Laterality: Right;  . STUMP REVISION Right 12/05/2018   Procedure: Revision Right Above Knee Amputation;  Surgeon: [name], MD;  Location: [facility];  Service: Orthopedics;  Laterality: Right;  . STUMP REVISION Right 05/29/2019   Procedure: REVISION RIGHT ABOVE KNEE AMPUTATION;  Surgeon: [name], MD;  Location: [facility];  Service: Orthopedics;  Laterality: Right;  . STUMP REVISION Right 06/24/2019   Procedure: STUMP REVISION;  Surgeon: [name], MD;  Location: [facility];  Service: Orthopedics;  Laterality: Right;    Allergies Latex  Family History  Problem Relation Age of Onset  . Cancer Other   . Diabetes Other     Social History Social History   Tobacco Use  . Smoking status: Current Every Day Smoker    Packs/day: 0.50    Types: Cigarettes  . Smokeless tobacco: Never Used  . Tobacco comment: 11/19/18  Substance Use Topics  . Alcohol use: No  . Drug use: No    Review of Systems  Constitutional: Positive fever/chills and generalized weakness.  Eyes: No visual  changes. ENT: No sore throat. Cardiovascular: Denies chest pain. Respiratory: Positive shortness of breath. Gastrointestinal: No abdominal pain.  No nausea, no vomiting.  No diarrhea.  No constipation. Genitourinary: Negative for dysuria. Musculoskeletal: Negative for back pain. Right stump pain.  Skin: Negative for rash. Neurological: Negative for headaches, focal weakness or numbness.  10-point ROS otherwise negative.  ____________________________________________   PHYSICAL EXAM:  VITAL SIGNS: ED Triage Vitals  Enc Vitals Group     BP 07/11/19 1007 114/69     Pulse Rate 07/11/19 1007 93     Resp 07/11/19 1007 20     Temp 07/11/19 1007 98.6 F (37 C)     Temp Source 07/11/19 1007 Oral     SpO2 07/11/19 1006 95 %   Constitutional: Alert and oriented. Well appearing and in no acute distress. Eyes: Conjunctivae are normal.  Head: Atraumatic. Nose: No congestion/rhinnorhea. Mouth/Throat: Mucous membranes are dry.  Neck: No stridor. Cardiovascular: Normal rate, regular rhythm. Good peripheral circulation. Grossly normal heart sounds.   Respiratory: Slight increased respiratory effort.  No retractions. Lungs CTAB. Gastrointestinal: Soft and nontender. No distention.  Musculoskeletal: Right AKA with clean/dry dressings. No pitting edema in the LLE.  Neurologic:  Normal speech and language. No gross focal neurologic deficits are appreciated.  Skin:  Skin is warm, dry and intact. No rash noted.  ____________________________________________   LABS (all labs ordered are listed, but only abnormal results are displayed)  Labs Reviewed  CBC WITH DIFFERENTIAL/PLATELET - Abnormal; Notable for the following components:      Result Value   RBC 3.31 (*)    Hemoglobin 8.2 (*)    HCT 28.4 (*)    MCH 24.8 (*)    MCHC 28.9 (*)    RDW 17.9 (*)    All other components within normal limits  COMPREHENSIVE METABOLIC PANEL - Abnormal; Notable for the following components:   Glucose, Bld  223 (*)    BUN 37 (*)    Creatinine, Ser 2.09 (*)    Calcium 8.2 (*)    Albumin 2.6 (*)    GFR calc non Af Amer 33 (*)    GFR calc Af Amer 39 (*)    All other components within normal limits  D-DIMER, QUANTITATIVE (NOT AT [facility]) - Abnormal; Notable for the following components:   D-Dimer, Quant 3.43 (*)    All other components within normal limits  LACTATE DEHYDROGENASE - Abnormal; Notable for the following components:   LDH 280 (*)    All other components within  normal limits  FIBRINOGEN - Abnormal; Notable for the following components:   Fibrinogen 761 (*)    All other components within normal limits  C-REACTIVE PROTEIN - Abnormal; Notable for the following components:   CRP 19.5 (*)    All other components within normal limits  POC SARS CORONAVIRUS 2 AG -  ED - Abnormal; Notable for the following components:   SARS Coronavirus 2 Ag POSITIVE (*)    All other components within normal limits  CULTURE, BLOOD (ROUTINE X 2)  CULTURE, BLOOD (ROUTINE X 2)  LACTIC ACID, PLASMA  PROCALCITONIN  TRIGLYCERIDES  FERRITIN  BRAIN NATRIURETIC PEPTIDE  C-REACTIVE PROTEIN  CBC WITH DIFFERENTIAL/PLATELET  COMPREHENSIVE METABOLIC PANEL  D-DIMER, QUANTITATIVE (NOT AT [facility])  FERRITIN  MAGNESIUM  PROCALCITONIN  PREPARE RBC (CROSSMATCH)  PREPARE FRESH FROZEN PLASMA  TYPE AND SCREEN  TYPE AND SCREEN   ____________________________________________  EKG   EKG Interpretation  Date/Time:  Saturday July 11 2019 10:05:01 EST Ventricular Rate:  87 PR Interval:    QRS Duration: 109 QT Interval:  348 QTC Calculation: 419 R Axis:   112 Text Interpretation: Sinus rhythm. Right axis deviation No STEMI Confirmed by Nanda Quinton [phone]) on 07/11/2019 10:47:07 AM       ____________________________________________  RADIOLOGY  DG Chest Port 1 View  Result Date: 07/11/2019 CLINICAL DATA:  COVID positive, shortness of breath. Hypoxemia. EXAM: PORTABLE CHEST 1 VIEW COMPARISON:  Chest x-ray  dated 05/22/2019. FINDINGS: Ill-defined airspace opacities bilaterally, RIGHT greater than LEFT. No pleural effusion or pneumothorax is seen. Borderline cardiomegaly is stable. Osseous structures about the chest are unremarkable. IMPRESSION: Multifocal pneumonia, RIGHT greater than LEFT. Electronically Signed   By: Franki Cabot M.D.   On: 07/11/2019 10:50    ____________________________________________   PROCEDURES  Procedure(s) performed:   Procedures  CRITICAL CARE Performed by: Margette Fast Total critical care time: 35 minutes Critical care time was exclusive of separately billable procedures and treating other patients. Critical care was necessary to treat or prevent imminent or life-threatening deterioration. Critical care was time spent personally by me on the following activities: development of treatment plan with patient and/or surrogate as well as nursing, discussions with consultants, evaluation of patient's response to treatment, examination of patient, obtaining history from patient or surrogate, ordering and performing treatments and interventions, ordering and review of laboratory studies, ordering and review of radiographic studies, pulse oximetry and re-evaluation of patient's condition.  Nanda Quinton, MD Emergency Medicine  ____________________________________________   INITIAL IMPRESSION / ASSESSMENT AND PLAN / ED COURSE  Pertinent labs & imaging results that were available during my care of the patient were reviewed by me and considered in my medical decision making (see chart for details).   Patient presents to the emergency department with worsening hypoxemia in the setting of known COVID-19 diagnosis. Tolerating NRB well and will continue with desaturation on [place]. No high flow available. Patient not requiring intubation at this time. Labs and imaging reviewed. Plan for admit.   Discussed patient's case with [facility] to request admission. Patient and family (if  present) updated with plan. Care transferred to [facility] service.  I reviewed all nursing notes, vitals, pertinent old records, EKGs, labs, imaging (as available).    ____________________________________________  FINAL CLINICAL IMPRESSION(S) / ED DIAGNOSES  Final diagnoses:  Acute respiratory failure with hypoxia (HCC)  COVID-19     MEDICATIONS GIVEN DURING THIS VISIT:  Medications  remdesivir 200 mg in sodium chloride 0.9% 250 mL IVPB (0 mg Intravenous Stopped 07/11/19 1518)  Followed by  remdesivir 100 mg in sodium chloride 0.9 % 100 mL IVPB (has no administration in time range)  oxyCODONE (Oxy IR/ROXICODONE) immediate release tablet 20 mg (20 mg Oral Given 07/11/19 1751)  acetaminophen (TYLENOL) tablet 650 mg (has no administration in time range)  cephALEXin (KEFLEX) capsule 500 mg (500 mg Oral Given 07/11/19 1726)  clindamycin (CLEOCIN) capsule 300 mg (300 mg Oral Given 07/11/19 1727)  terbinafine (LAMISIL) tablet 250 mg (has no administration in time range)  amLODipine (NORVASC) tablet 10 mg (has no administration in time range)  atorvastatin (LIPITOR) tablet 20 mg (has no administration in time range)  metoprolol tartrate (LOPRESSOR) tablet 50 mg (has no administration in time range)  ALPRAZolam (XANAX) tablet 0.25 mg (0.25 mg Oral Given 07/11/19 1725)  buPROPion (WELLBUTRIN XL) 24 hr tablet 300 mg (has no administration in time range)  zolpidem (AMBIEN) tablet 5 mg (has no administration in time range)  PARoxetine (PAXIL) tablet 20 mg (has no administration in time range)  bisacodyl (DULCOLAX) suppository 10 mg (has no administration in time range)  calcium carbonate (TUMS - dosed in mg elemental calcium) chewable tablet 400 mg of elemental calcium (has no administration in time range)  magnesium oxide (MAG-OX) tablet 400 mg (400 mg Oral Not Given 07/11/19 1526)  pantoprazole (PROTONIX) EC tablet 40 mg (40 mg Oral Not Given 07/11/19 1526)  polyethylene glycol (MIRALAX / GLYCOLAX)  packet 17 g (has no administration in time range)  senna-docusate (Senokot-S) tablet 2 tablet (has no administration in time range)  finasteride (PROSCAR) tablet 5 mg (5 mg Oral Not Given 07/11/19 1527)  mirabegron ER (MYRBETRIQ) tablet 50 mg (50 mg Oral Not Given 07/11/19 1527)  oxybutynin (DITROPAN XL) 24 hr tablet 15 mg (has no administration in time range)  tamsulosin (FLOMAX) capsule 0.4 mg (0.4 mg Oral Given 07/11/19 1725)  pregabalin (LYRICA) capsule 300 mg (300 mg Oral Given 07/11/19 1751)  tiZANidine (ZANAFLEX) tablet 4 mg (4 mg Oral Given 07/11/19 1725)  albuterol (VENTOLIN HFA) 108 (90 Base) MCG/ACT inhaler 2 puff (2 puffs Inhalation Given 07/11/19 1729)  fluticasone (FLONASE) 50 MCG/ACT nasal spray 2 spray (2 sprays Each Nare Not Given 07/11/19 1521)  lidocaine (LIDODERM) 5 % 1 patch (1 patch Transdermal Not Given 07/11/19 1730)  guaiFENesin-dextromethorphan (ROBITUSSIN DM) 100-10 MG/5ML syrup 10 mL (has no administration in time range)  chlorpheniramine-HYDROcodone (TUSSIONEX) 10-8 MG/5ML suspension 5 mL (has no administration in time range)  ascorbic acid (VITAMIN C) tablet 500 mg (500 mg Oral Given 07/11/19 1725)  zinc sulfate capsule 220 mg (220 mg Oral Given 07/11/19 1725)  insulin aspart (novoLOG) injection 0-20 Units (11 Units Subcutaneous Given 07/11/19 1734)  ondansetron (ZOFRAN) tablet 4 mg (has no administration in time range)    Or  ondansetron (ZOFRAN) injection 4 mg (has no administration in time range)  linagliptin (TRADJENTA) tablet 5 mg (has no administration in time range)  insulin detemir (LEVEMIR) injection 8 Units (has no administration in time range)  methylPREDNISolone sodium succinate (SOLU-MEDROL) 40 mg/mL injection 40 mg (has no administration in time range)  insulin aspart (novoLOG) injection 16 Units (16 Units Subcutaneous Given 07/11/19 1733)  umeclidinium bromide (INCRUSE ELLIPTA) 62.5 MCG/INH 1 puff (1 puff Inhalation Given 07/11/19 1727)  Muscle Rub CREA (has no  administration in time range)  enoxaparin (LOVENOX) injection 55 mg (55 mg Subcutaneous Given 07/11/19 1733)  calcium carbonate (TUMS - dosed in mg elemental calcium) chewable tablet 200 mg of elemental calcium (has no administration in time range)  0.9 %  sodium chloride infusion (Manually program via Guardrails IV Fluids) (has no administration in time range)  morphine 4 MG/ML injection 4 mg (4 mg Intravenous Given 07/11/19 1050)  ondansetron (ZOFRAN) injection 4 mg (4 mg Intravenous Given 07/11/19 1052)  dexamethasone (DECADRON) injection 10 mg (10 mg Intravenous Given 07/11/19 1051)  sodium chloride 0.9 % bolus 500 mL (500 mLs Intravenous New Bag/Given 07/11/19 1342)    Note:  This document was prepared using Dragon voice recognition software and may include unintentional dictation errors.  Nanda Quinton, MD, [facility] Emergency Medicine    Marae Cottrell, [facility], MD 07/11/19 [phone]

## 2019-07-11 NOTE — Progress Notes (Signed)
Report received from ER nurse @ [facility].

## 2019-07-11 NOTE — ED Notes (Signed)
Pt given cup of ice. Pt phone plugged into wall outlet on the left side of bed on top of the counter out of reach of fall. Pt lights turned down. TV and room phone in reach of pt. Pt comfortable at the moment.

## 2019-07-11 NOTE — Plan of Care (Signed)
Priscille Loveless, RN

## 2019-07-11 NOTE — Progress Notes (Signed)
Pt admitted to room 111. Assessment completed. MD in to discuss Plasma and consent w/ him. He wants MD to call his daughter first to get daughters insight b/c she is an Therapist, sports . MD to notify daughter and patient said he will sign consent after she approves it.

## 2019-07-12 DIAGNOSIS — N1832 Chronic kidney disease, stage 3b: Secondary | ICD-10-CM

## 2019-07-12 LAB — CBC WITH DIFFERENTIAL/PLATELET
Abs Immature Granulocytes: 0.02 10*3/uL (ref 0.00–0.07)
Basophils Absolute: 0 10*3/uL (ref 0.0–0.1)
Basophils Relative: 0 %
Eosinophils Absolute: 0 10*3/uL (ref 0.0–0.5)
Eosinophils Relative: 0 %
HCT: 22.7 % — ABNORMAL LOW (ref 39.0–52.0)
Hemoglobin: 6.6 g/dL — CL (ref 13.0–17.0)
Immature Granulocytes: 0 %
Lymphocytes Relative: 12 %
Lymphs Abs: 0.6 10*3/uL — ABNORMAL LOW (ref 0.7–4.0)
MCH: 24.5 pg — ABNORMAL LOW (ref 26.0–34.0)
MCHC: 29.1 g/dL — ABNORMAL LOW (ref 30.0–36.0)
MCV: 84.4 fL (ref 80.0–100.0)
Monocytes Absolute: 0.3 10*3/uL (ref 0.1–1.0)
Monocytes Relative: 6 %
Neutro Abs: 4.4 10*3/uL (ref 1.7–7.7)
Neutrophils Relative %: 82 %
Platelets: 205 10*3/uL (ref 150–400)
RBC: 2.69 MIL/uL — ABNORMAL LOW (ref 4.22–5.81)
RDW: 17.6 % — ABNORMAL HIGH (ref 11.5–15.5)
WBC: 5.3 10*3/uL (ref 4.0–10.5)
nRBC: 0 % (ref 0.0–0.2)

## 2019-07-12 LAB — COMPREHENSIVE METABOLIC PANEL
ALT: 11 U/L (ref 0–44)
AST: 16 U/L (ref 15–41)
Albumin: 2.6 g/dL — ABNORMAL LOW (ref 3.5–5.0)
Alkaline Phosphatase: 71 U/L (ref 38–126)
Anion gap: 10 (ref 5–15)
BUN: 38 mg/dL — ABNORMAL HIGH (ref 6–20)
CO2: 25 mmol/L (ref 22–32)
Calcium: 8.2 mg/dL — ABNORMAL LOW (ref 8.9–10.3)
Chloride: 103 mmol/L (ref 98–111)
Creatinine, Ser: 1.81 mg/dL — ABNORMAL HIGH (ref 0.61–1.24)
GFR calc Af Amer: 46 mL/min — ABNORMAL LOW (ref >60)
GFR calc non Af Amer: 40 mL/min — ABNORMAL LOW (ref >60)
Glucose, Bld: 216 mg/dL — ABNORMAL HIGH (ref 70–99)
Potassium: 4.8 mmol/L (ref 3.5–5.1)
Sodium: 138 mmol/L (ref 135–145)
Total Bilirubin: 0.2 mg/dL — ABNORMAL LOW (ref 0.3–1.2)
Total Protein: 6.9 g/dL (ref 6.5–8.1)

## 2019-07-12 LAB — HEMOGLOBIN AND HEMATOCRIT, BLOOD
HCT: 22.9 % — ABNORMAL LOW (ref 39.0–52.0)
Hemoglobin: 6.9 g/dL — CL (ref 13.0–17.0)

## 2019-07-12 LAB — GLUCOSE, CAPILLARY
Glucose-Capillary: 249 mg/dL — ABNORMAL HIGH (ref 70–99)
Glucose-Capillary: 237 mg/dL — ABNORMAL HIGH (ref 70–99)
Glucose-Capillary: 220 mg/dL — ABNORMAL HIGH (ref 70–99)
Glucose-Capillary: 164 mg/dL — ABNORMAL HIGH (ref 70–99)
Glucose-Capillary: 155 mg/dL — ABNORMAL HIGH (ref 70–99)

## 2019-07-12 LAB — PROCALCITONIN: Procalcitonin: 0.14 ng/mL

## 2019-07-12 LAB — FERRITIN: Ferritin: 95 ng/mL (ref 24–336)

## 2019-07-12 LAB — PREPARE RBC (CROSSMATCH)

## 2019-07-12 LAB — ABO/RH: ABO/RH(D): O POS

## 2019-07-12 LAB — D-DIMER, QUANTITATIVE: D-Dimer, Quant: 2.28 ug/mL-FEU — ABNORMAL HIGH (ref 0.00–0.50)

## 2019-07-12 LAB — C-REACTIVE PROTEIN: CRP: 18 mg/dL — ABNORMAL HIGH (ref <1.0)

## 2019-07-12 LAB — MAGNESIUM: Magnesium: 2.1 mg/dL (ref 1.7–2.4)

## 2019-07-12 MED ORDER — CEPHALEXIN 500 MG PO CAPS
500.0000 mg | ORAL_CAPSULE | Freq: Three times a day (TID) | ORAL | Status: AC
  Administered 2019-07-12 – 2019-07-17 (×17): 500 mg via ORAL
  Filled 2019-07-12 (×17): qty 1

## 2019-07-12 MED ORDER — METOPROLOL TARTRATE 25 MG PO TABS
25.0000 mg | ORAL_TABLET | Freq: Two times a day (BID) | ORAL | Status: DC
  Administered 2019-07-12 – 2019-07-14 (×6): 25 mg via ORAL
  Filled 2019-07-12 (×5): qty 1

## 2019-07-12 MED ORDER — CLINDAMYCIN HCL 300 MG PO CAPS
300.0000 mg | ORAL_CAPSULE | Freq: Three times a day (TID) | ORAL | Status: AC
  Administered 2019-07-12 – 2019-07-17 (×17): 300 mg via ORAL
  Filled 2019-07-12 (×17): qty 1

## 2019-07-12 MED ORDER — SODIUM CHLORIDE 0.9% IV SOLUTION: Freq: Once | INTRAVENOUS | Status: DC

## 2019-07-12 NOTE — Progress Notes (Signed)
PROGRESS NOTE  Gregory [name] [id number] DOB: 21-Dec-1960 DOA: 07/11/2019 PCP: [name], MD   LOS: 1 day   Brief Narrative / Interim history: 59 year old male with DM 2, status post right lower extremity amputation with multiple revisions most recent 18 June 2018, now AKA, HTN, COPD, chronic pain, hyperlipidemia, GERD, hospitalized several times since December all related to chronic osteomyelitis and chronic wound infection, currently at [place], there have been apparently multiple cases of Covid over there and came into the hospital with progressive shortness of breath over the last 5 days that has been getting worse.  On admission required 15 L nasal cannula  Subjective / 24h Interval events: He feels better this morning, less short of breath.  Denies any chest pain, no abdominal pain, no nausea or vomiting.  Assessment & Plan:  Principal Problem Acute Hypoxic Respiratory Failure due to Covid-19 Viral Illness/COVID-19 pneumonia -On admission was profoundly hypoxic requiring 15 L high flow nasal cannula. -Has just been weaned to 6 L when I entered the room setting in the upper 80s-lower 90s -Started on remdesivir for 5 days, continue -Continue steroids, continue, plan for 10 days -Received convalescent plasma on 1/23 -Given multiple chronic infections and current antibiotic treatment he is not a candidate for Actemra -Continue to monitor inflammatory markers, continue supportive treatment   COVID-19 Labs  Recent Labs    07/11/19 1023 07/11/19 1045  DDIMER 3.43*  --   FERRITIN  --  87  LDH 280*  --   CRP  --  19.5*    Active Problems Underlying COPD -No wheezing, currently on steroids, continue inhalers  DM 2, with hyperglycemia -Placed on Levemir, NovoLog 3 times daily with meals 16 units plus sliding scale -Also patient has a regular Coke, 2 L bottle in room.  CBG (last 3)  Recent Labs    07/12/19 0210 07/12/19 0434  GLUCAP 249* 237*     Essential hypertension -Continue Norvasc, metoprolol  Chronic kidney disease stage IIIb -Baseline creatinine 1.3-2.1, currently at baseline  Anemia -Continue statin  GERD -Continue PPI  Chronic osteomyelitis/lower extremity wounds -Status post revision by [name] at the beginning of January 2020 -Currently on Keflex and clindamycin based on prior microbiology with E. coli as well as MRSA, was discharged on 1/9 with plans for 21 days of antibiotics -He has 7 more days remaining  BPH -Continue tamsulosin, oxybutynin  Depression -Continue paroxetine  Scheduled Meds: . amLODipine  10 mg Oral Daily  . vitamin C  500 mg Oral Daily  . atorvastatin  20 mg Oral QHS  . buPROPion  300 mg Oral QHS  . calcium carbonate  1 tablet Oral TID WC  . cephALEXin  500 mg Oral TID  . clindamycin  300 mg Oral TID  . enoxaparin (LOVENOX) injection  55 mg Subcutaneous Q24H  . finasteride  5 mg Oral Daily  . fluticasone  2 spray Each Nare Daily  . insulin aspart  0-20 Units Subcutaneous Q4H  . insulin aspart  16 Units Subcutaneous TID WC  . insulin detemir  0.075 Units/kg Subcutaneous BID  . lidocaine  1 patch Transdermal Q24H  . linagliptin  5 mg Oral Daily  . magnesium oxide  400 mg Oral BID  . methylPREDNISolone (SOLU-MEDROL) injection  40 mg Intravenous Q12H  . metoprolol tartrate  50 mg Oral Daily  . mirabegron ER  50 mg Oral Daily  . oxybutynin  15 mg Oral QHS  . pantoprazole  40 mg Oral  Daily  . PARoxetine  20 mg Oral QHS  . pregabalin  300 mg Oral BID  . senna-docusate  2 tablet Oral QHS  . tamsulosin  0.4 mg Oral QPM  . terbinafine  250 mg Oral Daily  . umeclidinium bromide  1 puff Inhalation Daily  . zinc sulfate  220 mg Oral Daily   Continuous Infusions: . remdesivir 100 mg in NS 100 mL     PRN Meds:.acetaminophen, albuterol, ALPRAZolam, bisacodyl, calcium carbonate, chlorpheniramine-HYDROcodone, guaiFENesin-dextromethorphan, Muscle Rub, ondansetron **OR** ondansetron  (ZOFRAN) IV, oxyCODONE, polyethylene glycol, tiZANidine, zolpidem  DVT prophylaxis: Lovenox Code Status: Full code Family Communication: Daughter [name] [phone], she is an Therapist, sports at [employer] fourth floor Patient admitted from: SNF Anticipated d/c place: SNF Barriers to d/c: Profound hypoxia, IV antivirals  Consultants:  None  Procedures:  None   Microbiology: None   Antimicrobials: Clindamycin/Keflex-PTA, last day July 18, 2019  Objective: Vitals:   07/12/19 0315 07/12/19 0350 07/12/19 0415 07/12/19 0515  BP: 130/70 (!) 126/58 130/66 126/80  Pulse:  (!) 57    Resp:  16    Temp: (!) 97.4 F (36.3 C) 98.1 F (36.7 C) (!) 97.2 F (36.2 C) (!) 97.1 F (36.2 C)  TempSrc: Axillary Oral Axillary Axillary  SpO2:  99%    Weight:      Height:        Intake/Output Summary (Last 24 hours) at 07/12/2019 0616 Last data filed at 07/12/2019 0515 Gross per 24 hour  Intake 1895 ml  Output 200 ml  Net 1695 ml   Filed Weights   07/11/19 2211  Weight: 112 kg    Examination:  Constitutional: NAD Eyes: no scleral icterus ENMT: Mucous membranes are moist.  Neck: normal, supple Respiratory: Faint bibasilar rhonchi, overall diminished, no crackles, no wheezing Cardiovascular: Regular rate and rhythm, no murmurs / rubs / gallops. No LE edema.  Abdomen: non distended, no tenderness. Bowel sounds positive.  Musculoskeletal: no clubbing / cyanosis. Dressing on stump C/D/I Skin: no rashes Neurologic: Nonfocal Psychiatric: Normal judgment and insight. Alert and oriented x 3. Normal mood.    Data Reviewed: I have independently reviewed following labs and imaging studies   CBC: Recent Labs  Lab 07/11/19 1023  WBC 5.1  NEUTROABS 4.2  HGB 8.2*  HCT 28.4*  MCV 85.8  PLT [id number]   Basic Metabolic Panel: Recent Labs  Lab 07/11/19 1023  NA 138  K 4.5  CL 102  CO2 22  GLUCOSE 223*  BUN 37*  CREATININE 2.09*  CALCIUM 8.2*   GFR: Estimated Creatinine  Clearance: 48.6 mL/min (A) (by C-G formula based on SCr of 2.09 mg/dL (H)). Liver Function Tests: Recent Labs  Lab 07/11/19 1023  AST 18  ALT 11  ALKPHOS 80  BILITOT 0.5  PROT 6.9  ALBUMIN 2.6*   No results for input(s): LIPASE, AMYLASE in the last 168 hours. No results for input(s): AMMONIA in the last 168 hours. Coagulation Profile: No results for input(s): INR, PROTIME in the last 168 hours. Cardiac Enzymes: No results for input(s): CKTOTAL, CKMB, CKMBINDEX, TROPONINI in the last 168 hours. BNP (last 3 results) No results for input(s): PROBNP in the last 8760 hours. HbA1C: No results for input(s): HGBA1C in the last 72 hours. CBG: Recent Labs  Lab 07/12/19 0210 07/12/19 0434  GLUCAP 249* 237*   Lipid Profile: Recent Labs    07/11/19 1023  TRIG 143   Thyroid Function Tests: No results for input(s): TSH, T4TOTAL, FREET4, T3FREE, THYROIDAB in  the last 72 hours. Anemia Panel: Recent Labs    07/11/19 1045  FERRITIN 87   Urine analysis:    Component Value Date/Time   COLORURINE YELLOW 06/22/2019 2053   APPEARANCEUR CLEAR 06/22/2019 2053   LABSPEC 1.014 06/22/2019 2053   PHURINE 5.0 06/22/2019 2053   GLUCOSEU NEGATIVE 06/22/2019 2053   HGBUR NEGATIVE 06/22/2019 2053   [place] NEGATIVE 06/22/2019 2053   KETONESUR NEGATIVE 06/22/2019 2053   PROTEINUR 30 (A) 06/22/2019 2053   UROBILINOGEN 1.0 02/03/2014 0052   NITRITE NEGATIVE 06/22/2019 2053   LEUKOCYTESUR NEGATIVE 06/22/2019 2053   Sepsis Labs: Invalid input(s): PROCALCITONIN, LACTICIDVEN  No results found for this or any previous visit (from the past 240 hour(s)).    Radiology Studies: DG Chest Port 1 View  Result Date: 07/11/2019 CLINICAL DATA:  COVID positive, shortness of breath. Hypoxemia. EXAM: PORTABLE CHEST 1 VIEW COMPARISON:  Chest x-ray dated 05/22/2019. FINDINGS: Ill-defined airspace opacities bilaterally, RIGHT greater than LEFT. No pleural effusion or pneumothorax is seen. Borderline  cardiomegaly is stable. Osseous structures about the chest are unremarkable. IMPRESSION: Multifocal pneumonia, RIGHT greater than LEFT. Electronically Signed   By: Franki Cabot M.D.   On: 07/11/2019 10:50    Marzetta Board, MD, PhD [facility]  Between 7 am - 7 pm I am available, please contact me via Amion or Securechat  Between 7 pm - 7 am I am not available, please contact night coverage MD/APP via Amion

## 2019-07-13 LAB — BPAM FFP
Blood Product Expiration Date: 202101250145
ISSUE DATE / TIME: 202101240154
Unit Type and Rh: 5100

## 2019-07-13 LAB — CBC WITH DIFFERENTIAL/PLATELET
Abs Immature Granulocytes: 0.06 10*3/uL (ref 0.00–0.07)
Basophils Absolute: 0 10*3/uL (ref 0.0–0.1)
Basophils Relative: 0 %
Eosinophils Absolute: 0 10*3/uL (ref 0.0–0.5)
Eosinophils Relative: 0 %
HCT: 29.8 % — ABNORMAL LOW (ref 39.0–52.0)
Hemoglobin: 8.6 g/dL — ABNORMAL LOW (ref 13.0–17.0)
Immature Granulocytes: 1 %
Lymphocytes Relative: 14 %
Lymphs Abs: 1.2 10*3/uL (ref 0.7–4.0)
MCH: 24.9 pg — ABNORMAL LOW (ref 26.0–34.0)
MCHC: 28.9 g/dL — ABNORMAL LOW (ref 30.0–36.0)
MCV: 86.1 fL (ref 80.0–100.0)
Monocytes Absolute: 0.5 10*3/uL (ref 0.1–1.0)
Monocytes Relative: 6 %
Neutro Abs: 6.7 10*3/uL (ref 1.7–7.7)
Neutrophils Relative %: 79 %
Platelets: 244 10*3/uL (ref 150–400)
RBC: 3.46 MIL/uL — ABNORMAL LOW (ref 4.22–5.81)
RDW: 17.6 % — ABNORMAL HIGH (ref 11.5–15.5)
WBC: 8.5 10*3/uL (ref 4.0–10.5)
nRBC: 0 % (ref 0.0–0.2)

## 2019-07-13 LAB — COMPREHENSIVE METABOLIC PANEL
ALT: 12 U/L (ref 0–44)
AST: 22 U/L (ref 15–41)
Albumin: 3 g/dL — ABNORMAL LOW (ref 3.5–5.0)
Alkaline Phosphatase: 86 U/L (ref 38–126)
Anion gap: 11 (ref 5–15)
BUN: 41 mg/dL — ABNORMAL HIGH (ref 6–20)
CO2: 26 mmol/L (ref 22–32)
Calcium: 8.7 mg/dL — ABNORMAL LOW (ref 8.9–10.3)
Chloride: 102 mmol/L (ref 98–111)
Creatinine, Ser: 1.98 mg/dL — ABNORMAL HIGH (ref 0.61–1.24)
GFR calc Af Amer: 41 mL/min — ABNORMAL LOW (ref >60)
GFR calc non Af Amer: 36 mL/min — ABNORMAL LOW (ref >60)
Glucose, Bld: 77 mg/dL (ref 70–99)
Potassium: 4.6 mmol/L (ref 3.5–5.1)
Sodium: 139 mmol/L (ref 135–145)
Total Bilirubin: 0.4 mg/dL (ref 0.3–1.2)
Total Protein: 8 g/dL (ref 6.5–8.1)

## 2019-07-13 LAB — PREPARE FRESH FROZEN PLASMA: Unit division: 0

## 2019-07-13 LAB — TYPE AND SCREEN
ABO/RH(D): O POS
Antibody Screen: NEGATIVE
Unit division: 0

## 2019-07-13 LAB — D-DIMER, QUANTITATIVE: D-Dimer, Quant: 3.79 ug/mL-FEU — ABNORMAL HIGH (ref 0.00–0.50)

## 2019-07-13 LAB — FERRITIN: Ferritin: 84 ng/mL (ref 24–336)

## 2019-07-13 LAB — GLUCOSE, CAPILLARY
Glucose-Capillary: 291 mg/dL — ABNORMAL HIGH (ref 70–99)
Glucose-Capillary: 67 mg/dL — ABNORMAL LOW (ref 70–99)
Glucose-Capillary: 50 mg/dL — ABNORMAL LOW (ref 70–99)
Glucose-Capillary: 67 mg/dL — ABNORMAL LOW (ref 70–99)
Glucose-Capillary: 78 mg/dL (ref 70–99)
Glucose-Capillary: 167 mg/dL — ABNORMAL HIGH (ref 70–99)
Glucose-Capillary: 202 mg/dL — ABNORMAL HIGH (ref 70–99)
Glucose-Capillary: 107 mg/dL — ABNORMAL HIGH (ref 70–99)
Glucose-Capillary: 80 mg/dL (ref 70–99)

## 2019-07-13 LAB — MAGNESIUM: Magnesium: 2.3 mg/dL (ref 1.7–2.4)

## 2019-07-13 LAB — BPAM RBC
Blood Product Expiration Date: 202102202359
ISSUE DATE / TIME: 202101241607
Unit Type and Rh: 5100

## 2019-07-13 LAB — C-REACTIVE PROTEIN: CRP: 13.7 mg/dL — ABNORMAL HIGH (ref <1.0)

## 2019-07-13 MED ORDER — FUROSEMIDE 10 MG/ML IJ SOLN
40.0000 mg | Freq: Once | INTRAMUSCULAR | Status: AC
  Administered 2019-07-13: 40 mg via INTRAVENOUS
  Filled 2019-07-13: qty 4

## 2019-07-13 MED ORDER — INSULIN ASPART 100 UNIT/ML ~~LOC~~ SOLN
14.0000 [IU] | Freq: Three times a day (TID) | SUBCUTANEOUS | Status: DC
  Administered 2019-07-13 – 2019-07-18 (×15): 14 [IU] via SUBCUTANEOUS

## 2019-07-13 MED ORDER — INSULIN DETEMIR 100 UNIT/ML ~~LOC~~ SOLN
7.0000 [IU] | Freq: Two times a day (BID) | SUBCUTANEOUS | Status: DC
  Administered 2019-07-13 – 2019-07-18 (×11): 7 [IU] via SUBCUTANEOUS
  Filled 2019-07-13 (×12): qty 0.07

## 2019-07-13 NOTE — Progress Notes (Signed)
PROGRESS NOTE  Gregory [name] [name] DOB: 1960/06/30 DOA: 07/11/2019 PCP: Gregory Marble, MD   LOS: 2 days   Brief Narrative / Interim history: 59 year old male with DM 2, status post right lower extremity amputation with multiple revisions most recent 18 June 2018, now AKA, HTN, COPD, chronic pain, hyperlipidemia, GERD, hospitalized several times since December all related to chronic osteomyelitis and chronic wound infection, currently at [place], there have been apparently multiple cases of Covid over there and came into the hospital with progressive shortness of breath over the last 5 days that has been getting worse.  On admission required 15 L nasal cannula  Subjective / 24h Interval events: He says that his breathing has improved.  No abdominal pain, no nausea or vomiting.  No chest pain  Assessment & Plan:  Principal Problem Acute Hypoxic Respiratory Failure due to Covid-19 Viral Illness/COVID-19 pneumonia -On admission was profoundly hypoxic requiring 15 L high flow nasal cannula. -Currently on 10 L high flow -Started on remdesivir for 5 days, continue -Continue steroids, continue, plan for 10 days at a minimum -Received convalescent plasma on 1/23 -Given multiple chronic infections and current antibiotic treatment he is not a candidate for Actemra -Continue to monitor inflammatory markers, continue supportive treatment, CRP improving   COVID-19 Labs  Recent Labs    07/11/19 1023 07/11/19 1045 07/12/19 0920 07/13/19 0300  DDIMER 3.43*  --  2.28* 3.79*  FERRITIN  --  87 95 84  LDH 280*  --   --   --   CRP  --  19.5* 18.0* 13.7*    Active Problems Underlying COPD -No wheezing, currently on steroids, continue inhalers  DM 2, with hyperglycemia -Placed on Levemir, NovoLog scheduled plus sliding scale, had an episode of hypoglycemia earlier this morning and will decrease insulin regimen today  CBG (last 3)  Recent Labs    07/13/19 0358  07/13/19 0929 07/13/19 1129  GLUCAP 78 167* 202*   Essential hypertension -Continue Norvasc, metoprolol, blood pressure stable  Chronic kidney disease stage IIIb -Baseline creatinine 1.3-2.1, currently at baseline  Anemia -Continue statin  GERD -Continue PPI  Chronic osteomyelitis/lower extremity wounds -Status post revision by [name] at the beginning of January 2020 -Currently on Keflex and clindamycin based on prior microbiology with E. coli as well as MRSA, was discharged on 1/9 with plans for 21 days of antibiotics -He has 7 more days remaining  BPH -Continue tamsulosin, oxybutynin  Depression -Continue paroxetine  Scheduled Meds: . sodium chloride   Intravenous Once  . amLODipine  10 mg Oral Daily  . vitamin C  500 mg Oral Daily  . atorvastatin  20 mg Oral QHS  . buPROPion  300 mg Oral QHS  . calcium carbonate  1 tablet Oral TID WC  . cephALEXin  500 mg Oral TID  . clindamycin  300 mg Oral TID  . enoxaparin (LOVENOX) injection  55 mg Subcutaneous Q24H  . finasteride  5 mg Oral Daily  . fluticasone  2 spray Each Nare Daily  . insulin aspart  0-20 Units Subcutaneous Q4H  . insulin aspart  14 Units Subcutaneous TID WC  . insulin detemir  7 Units Subcutaneous BID  . lidocaine  1 patch Transdermal Q24H  . linagliptin  5 mg Oral Daily  . magnesium oxide  400 mg Oral BID  . methylPREDNISolone (SOLU-MEDROL) injection  40 mg Intravenous Q12H  . metoprolol tartrate  25 mg Oral BID  . mirabegron ER  50 mg  Oral Daily  . oxybutynin  15 mg Oral QHS  . pantoprazole  40 mg Oral Daily  . PARoxetine  20 mg Oral QHS  . pregabalin  300 mg Oral BID  . senna-docusate  2 tablet Oral QHS  . tamsulosin  0.4 mg Oral QPM  . terbinafine  250 mg Oral Daily  . umeclidinium bromide  1 puff Inhalation Daily  . zinc sulfate  220 mg Oral Daily   Continuous Infusions: . remdesivir 100 mg in NS 100 mL 100 mg (07/13/19 1121)   PRN Meds:.acetaminophen, albuterol, ALPRAZolam, bisacodyl,  calcium carbonate, chlorpheniramine-HYDROcodone, guaiFENesin-dextromethorphan, Muscle Rub, ondansetron **OR** ondansetron (ZOFRAN) IV, oxyCODONE, polyethylene glycol, tiZANidine, zolpidem  DVT prophylaxis: Lovenox Code Status: Full code Family Communication: Daughter Gregory [name] [phone], she is an Therapist, sports at [employer] fourth floor Patient admitted from: SNF Anticipated d/c place: SNF Barriers to d/c: Persistent hypoxia  Consultants:  None  Procedures:  None   Microbiology: None   Antimicrobials: Clindamycin/Keflex-PTA, last day July 18, 2019  Objective: Vitals:   07/12/19 2115 07/12/19 2230 07/12/19 2345 07/13/19 0400  BP: 124/66 121/60 118/70 (!) 116/59  Pulse: 60 (!) 58 62 60  Resp: 20 20 20 16   Temp: 97.8 F (36.6 C) (!) 97.1 F (36.2 C) 98.1 F (36.7 C) 98.2 F (36.8 C)  TempSrc: Oral Oral Oral Axillary  SpO2: 99% 99% 95% 97%  Weight:      Height:        Intake/Output Summary (Last 24 hours) at 07/13/2019 1307 Last data filed at 07/13/2019 0500 Gross per 24 hour  Intake 1600 ml  Output 550 ml  Net 1050 ml   Filed Weights   07/11/19 2211  Weight: 112 kg    Examination:  Constitutional: Sleeping, no distress Eyes: No scleral icterus ENMT: Moist mucous membranes Neck: normal, supple Respiratory: Bibasilar rhonchi, no crackles, no wheezing Cardiovascular: Regular rate and rhythm, no murmurs, no peripheral edema Abdomen: Soft, nondistended, nontender, bowel sounds positive Musculoskeletal: no clubbing / cyanosis. Dressing on stump C/D/I Skin: No rashes appreciated Neurologic: No focal deficits, moves extremities independently, equal strength  Data Reviewed: I have independently reviewed following labs and imaging studies   CBC: Recent Labs  Lab 07/11/19 1023 07/12/19 0920 07/12/19 1300 07/13/19 0300  WBC 5.1 5.3  --  8.5  NEUTROABS 4.2 4.4  --  6.7  HGB 8.2* 6.6* 6.9* 8.6*  HCT 28.4* 22.7* 22.9* 29.8*  MCV 85.8 84.4  --  86.1  PLT  214 205  --  [id number]   Basic Metabolic Panel: Recent Labs  Lab 07/11/19 1023 07/12/19 0920 07/13/19 0300  NA 138 138 139  K 4.5 4.8 4.6  CL 102 103 102  CO2 22 25 26   GLUCOSE 223* 216* 77  BUN 37* 38* 41*  CREATININE 2.09* 1.81* 1.98*  CALCIUM 8.2* 8.2* 8.7*  MG  --  2.1 2.3   GFR: Estimated Creatinine Clearance: 51.3 mL/min (A) (by C-G formula based on SCr of 1.98 mg/dL (H)). Liver Function Tests: Recent Labs  Lab 07/11/19 1023 07/12/19 0920 07/13/19 0300  AST 18 16 22   ALT 11 11 12   ALKPHOS 80 71 86  BILITOT 0.5 0.2* 0.4  PROT 6.9 6.9 8.0  ALBUMIN 2.6* 2.6* 3.0*   No results for input(s): LIPASE, AMYLASE in the last 168 hours. No results for input(s): AMMONIA in the last 168 hours. Coagulation Profile: No results for input(s): INR, PROTIME in the last 168 hours. Cardiac Enzymes: No results for input(s): CKTOTAL, CKMB,  CKMBINDEX, TROPONINI in the last 168 hours. BNP (last 3 results) No results for input(s): PROBNP in the last 8760 hours. HbA1C: No results for input(s): HGBA1C in the last 72 hours. CBG: Recent Labs  Lab 07/13/19 0117 07/13/19 0218 07/13/19 0358 07/13/19 0929 07/13/19 1129  GLUCAP 50* 67* 78 167* 202*   Lipid Profile: Recent Labs    07/11/19 1023  TRIG 143   Thyroid Function Tests: No results for input(s): TSH, T4TOTAL, FREET4, T3FREE, THYROIDAB in the last 72 hours. Anemia Panel: Recent Labs    07/12/19 0920 07/13/19 0300  FERRITIN 95 84   Urine analysis:    Component Value Date/Time   COLORURINE YELLOW 06/22/2019 2053   APPEARANCEUR CLEAR 06/22/2019 2053   LABSPEC 1.014 06/22/2019 2053   PHURINE 5.0 06/22/2019 2053   GLUCOSEU NEGATIVE 06/22/2019 2053   HGBUR NEGATIVE 06/22/2019 2053   [place] NEGATIVE 06/22/2019 2053   KETONESUR NEGATIVE 06/22/2019 2053   PROTEINUR 30 (A) 06/22/2019 2053   UROBILINOGEN 1.0 02/03/2014 0052   NITRITE NEGATIVE 06/22/2019 2053   LEUKOCYTESUR NEGATIVE 06/22/2019 2053   Sepsis  Labs: Invalid input(s): PROCALCITONIN, LACTICIDVEN  Recent Results (from the past 240 hour(s))  Blood Culture (routine x 2)     Status: None (Preliminary result)   Collection Time: 07/11/19 10:22 AM   Specimen: BLOOD  Result Value Ref Range Status   Specimen Description BLOOD RIGHT ANTECUBITAL  Final   Special Requests   Final    BOTTLES DRAWN AEROBIC AND ANAEROBIC Blood Culture adequate volume   Culture   Final    NO GROWTH 2 DAYS Performed at [facility], [address] [place], [place] [address]    Report Status PENDING  Incomplete  Blood Culture (routine x 2)     Status: None (Preliminary result)   Collection Time: 07/11/19 11:10 AM   Specimen: BLOOD RIGHT HAND  Result Value Ref Range Status   Specimen Description BLOOD RIGHT HAND  Final   Special Requests   Final    BOTTLES DRAWN AEROBIC AND ANAEROBIC Blood Culture results may not be optimal due to an inadequate volume of blood received in culture bottles   Culture   Final    NO GROWTH 2 DAYS Performed at [facility], [place] [address], [place], [place] [address]    Report Status PENDING  Incomplete      Radiology Studies: No results found.  Marzetta Board, MD, PhD [facility]  Between 7 am - 7 pm I am available, please contact me via Amion or Securechat  Between 7 pm - 7 am I am not available, please contact night coverage MD/APP via Amion

## 2019-07-14 ENCOUNTER — Inpatient Hospital Stay (HOSPITAL_COMMUNITY): Payer: Medicaid Other

## 2019-07-14 DIAGNOSIS — R7989 Other specified abnormal findings of blood chemistry: Secondary | ICD-10-CM

## 2019-07-14 LAB — GLUCOSE, CAPILLARY
Glucose-Capillary: 148 mg/dL — ABNORMAL HIGH (ref 70–99)
Glucose-Capillary: 176 mg/dL — ABNORMAL HIGH (ref 70–99)
Glucose-Capillary: 177 mg/dL — ABNORMAL HIGH (ref 70–99)
Glucose-Capillary: 178 mg/dL — ABNORMAL HIGH (ref 70–99)
Glucose-Capillary: 202 mg/dL — ABNORMAL HIGH (ref 70–99)
Glucose-Capillary: 202 mg/dL — ABNORMAL HIGH (ref 70–99)

## 2019-07-14 LAB — COMPREHENSIVE METABOLIC PANEL
ALT: 10 U/L (ref 0–44)
AST: 17 U/L (ref 15–41)
Albumin: 2.7 g/dL — ABNORMAL LOW (ref 3.5–5.0)
Alkaline Phosphatase: 72 U/L (ref 38–126)
Anion gap: 12 (ref 5–15)
BUN: 41 mg/dL — ABNORMAL HIGH (ref 6–20)
CO2: 26 mmol/L (ref 22–32)
Calcium: 8.5 mg/dL — ABNORMAL LOW (ref 8.9–10.3)
Chloride: 102 mmol/L (ref 98–111)
Creatinine, Ser: 1.79 mg/dL — ABNORMAL HIGH (ref 0.61–1.24)
GFR calc Af Amer: 47 mL/min — ABNORMAL LOW (ref >60)
GFR calc non Af Amer: 40 mL/min — ABNORMAL LOW (ref >60)
Glucose, Bld: 167 mg/dL — ABNORMAL HIGH (ref 70–99)
Potassium: 5.3 mmol/L — ABNORMAL HIGH (ref 3.5–5.1)
Sodium: 140 mmol/L (ref 135–145)
Total Bilirubin: 0.8 mg/dL (ref 0.3–1.2)
Total Protein: 7.3 g/dL (ref 6.5–8.1)

## 2019-07-14 LAB — CBC WITH DIFFERENTIAL/PLATELET
Abs Immature Granulocytes: 0.06 10*3/uL (ref 0.00–0.07)
Basophils Absolute: 0 10*3/uL (ref 0.0–0.1)
Basophils Relative: 0 %
Eosinophils Absolute: 0 10*3/uL (ref 0.0–0.5)
Eosinophils Relative: 0 %
HCT: 27.5 % — ABNORMAL LOW (ref 39.0–52.0)
Hemoglobin: 7.9 g/dL — ABNORMAL LOW (ref 13.0–17.0)
Immature Granulocytes: 1 %
Lymphocytes Relative: 8 %
Lymphs Abs: 0.5 10*3/uL — ABNORMAL LOW (ref 0.7–4.0)
MCH: 24.9 pg — ABNORMAL LOW (ref 26.0–34.0)
MCHC: 28.7 g/dL — ABNORMAL LOW (ref 30.0–36.0)
MCV: 86.8 fL (ref 80.0–100.0)
Monocytes Absolute: 0.3 10*3/uL (ref 0.1–1.0)
Monocytes Relative: 4 %
Neutro Abs: 5.6 10*3/uL (ref 1.7–7.7)
Neutrophils Relative %: 87 %
Platelets: 247 10*3/uL (ref 150–400)
RBC: 3.17 MIL/uL — ABNORMAL LOW (ref 4.22–5.81)
RDW: 17.5 % — ABNORMAL HIGH (ref 11.5–15.5)
WBC: 6.5 10*3/uL (ref 4.0–10.5)
nRBC: 0 % (ref 0.0–0.2)

## 2019-07-14 LAB — FERRITIN: Ferritin: 74 ng/mL (ref 24–336)

## 2019-07-14 LAB — MAGNESIUM: Magnesium: 2.3 mg/dL (ref 1.7–2.4)

## 2019-07-14 LAB — D-DIMER, QUANTITATIVE: D-Dimer, Quant: 3.4 ug/mL-FEU — ABNORMAL HIGH (ref 0.00–0.50)

## 2019-07-14 LAB — C-REACTIVE PROTEIN: CRP: 21.2 mg/dL — ABNORMAL HIGH (ref <1.0)

## 2019-07-14 MED ORDER — TOCILIZUMAB 400 MG/20ML IV SOLN
800.0000 mg | Freq: Once | INTRAVENOUS | Status: AC
  Administered 2019-07-14: 800 mg via INTRAVENOUS
  Filled 2019-07-14: qty 40

## 2019-07-14 NOTE — Progress Notes (Signed)
Bilateral lower extremity venous duplex complete.  Please see CV Proc tab for preliminary results. [place], RVT 2:56 PM  07/14/2019

## 2019-07-14 NOTE — Progress Notes (Addendum)
PROGRESS NOTE  Gregory [name] [id number] DOB: 10/26/1960 DOA: 07/11/2019 PCP: [name], MD   LOS: 3 days   Brief Narrative / Interim history: 59 year old male with DM 2, status post right lower extremity amputation with multiple revisions most recent 18 June 2018, now AKA, HTN, COPD, chronic pain, hyperlipidemia, GERD, hospitalized several times since December all related to chronic osteomyelitis and chronic wound infection, currently at [facility], there have been apparently multiple cases of Covid over there and came into the hospital with progressive shortness of breath over the last 5 days that has been getting worse.  On admission required 15 L nasal cannula, minimally improved since  Subjective / 24h Interval events: Complains of pain at the stump level, breathing about the same  Assessment & Plan:  Principal Problem Acute Hypoxic Respiratory Failure due to Covid-19 Viral Illness/COVID-19 pneumonia -Currently on remdesivir, last dose on Wednesday, 1/27 -On steroids Solu-Medrol 40 mg every 12 -Supportive treatment with inhalers, antitussives, incentive spirometer, flutter valve -Status post convalescent plasma on 1/23 -Currently remains on 10 L high flow nasal cannula, he is at risk for decompensation and ICU need, closely monitor -Continue to monitor inflammatory markers -Patient has had a significant increase in his CRP today and remains profoundly hypoxic.  I am concerned that his sudden jump in CRP will proceed worsening of respiratory status.  I have discussed with the patient's daughter regarding Actemra and will go ahead and give a dose today  The patient has hypoxia and is high-risk for intubation (due to age, CRP significantly increased), but expected to survive >48 hours and has good baseline functional status.  She is not known to be on immunomodulators, anti-rejection medications, or cancer chemotherapy, has no history of TB or latent TB, and no  history of diverticulitis or intestinal perforation.  Platelets are >50K, ANC is >500, and ALT/AST are below 5x ULN with no known hepatitis B infection. The investigational nature of this medication was discussed with the patient's daughter and she agrees to the use of this medication.   COVID-19 Labs  Recent Labs    07/12/19 0920 07/13/19 0300 07/14/19 0157  DDIMER 2.28* 3.79* 3.40*  FERRITIN 95 84 74  CRP 18.0* 13.7* 21.2*    Active Problems Underlying COPD -No wheezing, currently on steroids, continue inhalers  DM 2, with hyperglycemia -Placed on Levemir, NovoLog scheduled plus sliding scale, had an episode of hypoglycemia 1/20 at 5 AM and his insulin regimen has been adjusted and CBGs now look much better  CBG (last 3)  Recent Labs    07/13/19 1934 07/13/19 2351 07/14/19 0354  GLUCAP 80 148* 177*   Essential hypertension -Continue Norvasc, metoprolol, blood pressure remained stable  Chronic kidney disease stage IIIb -Baseline creatinine 1.3-2.1, currently at baseline at 1.79.  Lab Results  Component Value Date   CREATININE 1.98 (H) 07/13/2019   CREATININE 1.81 (H) 07/12/2019   CREATININE 2.09 (H) 07/11/2019   Anemia -Of chronic kidney disease, no bleeding, continue to monitor  GERD -Continue PPI  Chronic osteomyelitis/lower extremity wounds -Status post revision by [name] at the beginning of January 2020 -Currently on Keflex and clindamycin based on prior microbiology with E. coli as well as MRSA, was discharged on 1/9 with plans for 21 days of antibiotics -Scheduled to finish antibiotics on 1/30  BPH -Continue tamsulosin, oxybutynin  Depression -Continue paroxetine   Scheduled Meds: . sodium chloride   Intravenous Once  . amLODipine  10 mg Oral Daily  . vitamin  C  500 mg Oral Daily  . atorvastatin  20 mg Oral QHS  . buPROPion  300 mg Oral QHS  . calcium carbonate  1 tablet Oral TID WC  . cephALEXin  500 mg Oral TID  . clindamycin  300 mg Oral  TID  . enoxaparin (LOVENOX) injection  55 mg Subcutaneous Q24H  . finasteride  5 mg Oral Daily  . fluticasone  2 spray Each Nare Daily  . insulin aspart  0-20 Units Subcutaneous Q4H  . insulin aspart  14 Units Subcutaneous TID WC  . insulin detemir  7 Units Subcutaneous BID  . lidocaine  1 patch Transdermal Q24H  . linagliptin  5 mg Oral Daily  . magnesium oxide  400 mg Oral BID  . methylPREDNISolone (SOLU-MEDROL) injection  40 mg Intravenous Q12H  . metoprolol tartrate  25 mg Oral BID  . mirabegron ER  50 mg Oral Daily  . oxybutynin  15 mg Oral QHS  . pantoprazole  40 mg Oral Daily  . PARoxetine  20 mg Oral QHS  . pregabalin  300 mg Oral BID  . senna-docusate  2 tablet Oral QHS  . tamsulosin  0.4 mg Oral QPM  . terbinafine  250 mg Oral Daily  . umeclidinium bromide  1 puff Inhalation Daily  . zinc sulfate  220 mg Oral Daily   Continuous Infusions: . remdesivir 100 mg in NS 100 mL 100 mg (07/13/19 1121)   PRN Meds:.acetaminophen, albuterol, ALPRAZolam, bisacodyl, calcium carbonate, chlorpheniramine-HYDROcodone, guaiFENesin-dextromethorphan, Muscle Rub, ondansetron **OR** ondansetron (ZOFRAN) IV, oxyCODONE, polyethylene glycol, tiZANidine, zolpidem  DVT prophylaxis: Lovenox Code Status: Full code Family Communication: Updated daughter [name] [phone] on 1/26 Patient admitted from: SNF Anticipated d/c place: SNF Barriers to d/c: Persistent hypoxia  Consultants:  None  Procedures:  None   Microbiology: None   Antimicrobials: Clindamycin/Keflex-PTA, last day July 18, 2019  Objective: Vitals:   07/13/19 1615 07/13/19 1925 07/13/19 2347 07/14/19 0348  BP: 139/80 129/82 120/67 (!) 116/59  Pulse: 73 73 (!) 58 (!) 55  Resp:  17 19 17   Temp: 98.3 F (36.8 C) 100 F (37.8 C) 99.2 F (37.3 C) 98.5 F (36.9 C)  TempSrc: Axillary Oral Axillary Axillary  SpO2: (!) 82% 90% 99% 97%  Weight:      Height:        Intake/Output Summary (Last 24 hours) at  07/14/2019 0617 Last data filed at 07/14/2019 0300 Gross per 24 hour  Intake 200 ml  Output 700 ml  Net -500 ml   Filed Weights   07/11/19 2211  Weight: 112 kg    Examination:  Constitutional: Laying on his back, tachypneic but no significant distress Eyes: No scleral icterus seen ENMT: Moist mucous membranes Neck: normal, supple Respiratory: Bibasilar rhonchi, no crackles, no wheezing Cardiovascular: Regular rate and rhythm, no murmurs, no edema Abdomen: Soft, nontender, nondistended, positive bowel sounds Musculoskeletal: no clubbing / cyanosis. Dressing on stump C/D/I Skin: No new rashes Neurologic: No focal deficits, moves extremities independently and has equal strength  Data Reviewed: I have independently reviewed following labs and imaging studies   CBC: Recent Labs  Lab 07/11/19 1023 07/12/19 0920 07/12/19 1300 07/13/19 0300 07/14/19 0157  WBC 5.1 5.3  --  8.5 6.5  NEUTROABS 4.2 4.4  --  6.7 5.6  HGB 8.2* 6.6* 6.9* 8.6* 7.9*  HCT 28.4* 22.7* 22.9* 29.8* 27.5*  MCV 85.8 84.4  --  86.1 86.8  PLT 214 205  --  244 247   Basic  Metabolic Panel: Recent Labs  Lab 07/11/19 1023 07/12/19 0920 07/13/19 0300  NA 138 138 139  K 4.5 4.8 4.6  CL 102 103 102  CO2 22 25 26   GLUCOSE 223* 216* 77  BUN 37* 38* 41*  CREATININE 2.09* 1.81* 1.98*  CALCIUM 8.2* 8.2* 8.7*  MG  --  2.1 2.3   GFR: Estimated Creatinine Clearance: 51.3 mL/min (A) (by C-G formula based on SCr of 1.98 mg/dL (H)). Liver Function Tests: Recent Labs  Lab 07/11/19 1023 07/12/19 0920 07/13/19 0300  AST 18 16 22   ALT 11 11 12   ALKPHOS 80 71 86  BILITOT 0.5 0.2* 0.4  PROT 6.9 6.9 8.0  ALBUMIN 2.6* 2.6* 3.0*   No results for input(s): LIPASE, AMYLASE in the last 168 hours. No results for input(s): AMMONIA in the last 168 hours. Coagulation Profile: No results for input(s): INR, PROTIME in the last 168 hours. Cardiac Enzymes: No results for input(s): CKTOTAL, CKMB, CKMBINDEX, TROPONINI in  the last 168 hours. BNP (last 3 results) No results for input(s): PROBNP in the last 8760 hours. HbA1C: No results for input(s): HGBA1C in the last 72 hours. CBG: Recent Labs  Lab 07/13/19 1129 07/13/19 1530 07/13/19 1934 07/13/19 2351 07/14/19 0354  GLUCAP 202* 107* 80 148* 177*   Lipid Profile: Recent Labs    07/11/19 1023  TRIG 143   Thyroid Function Tests: No results for input(s): TSH, T4TOTAL, FREET4, T3FREE, THYROIDAB in the last 72 hours. Anemia Panel: Recent Labs    07/12/19 0920 07/13/19 0300  FERRITIN 95 84   Urine analysis:    Component Value Date/Time   COLORURINE YELLOW 06/22/2019 2053   APPEARANCEUR CLEAR 06/22/2019 2053   LABSPEC 1.014 06/22/2019 2053   PHURINE 5.0 06/22/2019 2053   GLUCOSEU NEGATIVE 06/22/2019 2053   HGBUR NEGATIVE 06/22/2019 2053   [place] NEGATIVE 06/22/2019 2053   KETONESUR NEGATIVE 06/22/2019 2053   PROTEINUR 30 (A) 06/22/2019 2053   UROBILINOGEN 1.0 02/03/2014 0052   NITRITE NEGATIVE 06/22/2019 2053   LEUKOCYTESUR NEGATIVE 06/22/2019 2053   Sepsis Labs: Invalid input(s): PROCALCITONIN, LACTICIDVEN  Recent Results (from the past 240 hour(s))  Blood Culture (routine x 2)     Status: None (Preliminary result)   Collection Time: 07/11/19 10:22 AM   Specimen: BLOOD  Result Value Ref Range Status   Specimen Description BLOOD RIGHT ANTECUBITAL  Final   Special Requests   Final    BOTTLES DRAWN AEROBIC AND ANAEROBIC Blood Culture adequate volume   Culture   Final    NO GROWTH 2 DAYS Performed at [facility], [address] [place], [place] [address]    Report Status PENDING  Incomplete  Blood Culture (routine x 2)     Status: None (Preliminary result)   Collection Time: 07/11/19 11:10 AM   Specimen: BLOOD RIGHT HAND  Result Value Ref Range Status   Specimen Description BLOOD RIGHT HAND  Final   Special Requests   Final    BOTTLES DRAWN AEROBIC AND ANAEROBIC Blood Culture results may not be optimal due to an  inadequate volume of blood received in culture bottles   Culture   Final    NO GROWTH 2 DAYS Performed at [facility], [place] [address] [place], [place] [address]    Report Status PENDING  Incomplete      Radiology Studies: No results found.  Marzetta Board, MD, PhD [facility]  Between 7 am - 7 pm I am available, please contact me  via Amion or Securechat  Between 7 pm - 7 am I am not available, please contact night coverage MD/APP via Amion

## 2019-07-14 NOTE — Plan of Care (Signed)
Pt rested during the night, did seem a little groggy so avoided prn meds. Currently on 10L HFNC. Fall precautions in place  Problem: Education: Goal: Knowledge of risk factors and measures for prevention of condition will improve Outcome: Progressing   Problem: Coping: Goal: Psychosocial and spiritual needs will be supported Outcome: Progressing   Problem: Respiratory: Goal: Will maintain a patent airway Outcome: Progressing Goal: Complications related to the disease process, condition or treatment will be avoided or minimized Outcome: Progressing

## 2019-07-14 NOTE — Progress Notes (Addendum)
Walking into the room pt begins to ask for his xanax. Falling asleep before I walk out of room, but says he really needs it.

## 2019-07-15 DIAGNOSIS — J9601 Acute respiratory failure with hypoxia: Secondary | ICD-10-CM

## 2019-07-15 LAB — COMPREHENSIVE METABOLIC PANEL
ALT: 10 U/L (ref 0–44)
AST: 14 U/L — ABNORMAL LOW (ref 15–41)
Albumin: 2.6 g/dL — ABNORMAL LOW (ref 3.5–5.0)
Alkaline Phosphatase: 72 U/L (ref 38–126)
Anion gap: 10 (ref 5–15)
BUN: 40 mg/dL — ABNORMAL HIGH (ref 6–20)
CO2: 27 mmol/L (ref 22–32)
Calcium: 8.9 mg/dL (ref 8.9–10.3)
Chloride: 103 mmol/L (ref 98–111)
Creatinine, Ser: 1.59 mg/dL — ABNORMAL HIGH (ref 0.61–1.24)
GFR calc Af Amer: 54 mL/min — ABNORMAL LOW (ref >60)
GFR calc non Af Amer: 46 mL/min — ABNORMAL LOW (ref >60)
Glucose, Bld: 152 mg/dL — ABNORMAL HIGH (ref 70–99)
Potassium: 4.8 mmol/L (ref 3.5–5.1)
Sodium: 140 mmol/L (ref 135–145)
Total Bilirubin: 0.5 mg/dL (ref 0.3–1.2)
Total Protein: 7.1 g/dL (ref 6.5–8.1)

## 2019-07-15 LAB — CBC WITH DIFFERENTIAL/PLATELET
Abs Immature Granulocytes: 0.07 10*3/uL (ref 0.00–0.07)
Basophils Absolute: 0 10*3/uL (ref 0.0–0.1)
Basophils Relative: 0 %
Eosinophils Absolute: 0 10*3/uL (ref 0.0–0.5)
Eosinophils Relative: 0 %
HCT: 27.5 % — ABNORMAL LOW (ref 39.0–52.0)
Hemoglobin: 8.1 g/dL — ABNORMAL LOW (ref 13.0–17.0)
Immature Granulocytes: 2 %
Lymphocytes Relative: 15 %
Lymphs Abs: 0.7 10*3/uL (ref 0.7–4.0)
MCH: 24.7 pg — ABNORMAL LOW (ref 26.0–34.0)
MCHC: 29.5 g/dL — ABNORMAL LOW (ref 30.0–36.0)
MCV: 83.8 fL (ref 80.0–100.0)
Monocytes Absolute: 0.3 10*3/uL (ref 0.1–1.0)
Monocytes Relative: 8 %
Neutro Abs: 3.3 10*3/uL (ref 1.7–7.7)
Neutrophils Relative %: 75 %
Platelets: 251 10*3/uL (ref 150–400)
RBC: 3.28 MIL/uL — ABNORMAL LOW (ref 4.22–5.81)
RDW: 17.1 % — ABNORMAL HIGH (ref 11.5–15.5)
WBC: 4.4 10*3/uL (ref 4.0–10.5)
nRBC: 0.5 % — ABNORMAL HIGH (ref 0.0–0.2)

## 2019-07-15 LAB — D-DIMER, QUANTITATIVE: D-Dimer, Quant: 3.67 ug/mL-FEU — ABNORMAL HIGH (ref 0.00–0.50)

## 2019-07-15 LAB — MAGNESIUM: Magnesium: 2.5 mg/dL — ABNORMAL HIGH (ref 1.7–2.4)

## 2019-07-15 LAB — IRON AND TIBC
Iron: 22 ug/dL — ABNORMAL LOW (ref 45–182)
Saturation Ratios: 9 % — ABNORMAL LOW (ref 17.9–39.5)
TIBC: 238 ug/dL — ABNORMAL LOW (ref 250–450)
UIBC: 216 ug/dL

## 2019-07-15 LAB — GLUCOSE, CAPILLARY
Glucose-Capillary: 152 mg/dL — ABNORMAL HIGH (ref 70–99)
Glucose-Capillary: 158 mg/dL — ABNORMAL HIGH (ref 70–99)
Glucose-Capillary: 193 mg/dL — ABNORMAL HIGH (ref 70–99)
Glucose-Capillary: 202 mg/dL — ABNORMAL HIGH (ref 70–99)
Glucose-Capillary: 244 mg/dL — ABNORMAL HIGH (ref 70–99)
Glucose-Capillary: 269 mg/dL — ABNORMAL HIGH (ref 70–99)

## 2019-07-15 LAB — VITAMIN B12: Vitamin B-12: 495 pg/mL (ref 180–914)

## 2019-07-15 LAB — FOLATE: Folate: 9.3 ng/mL (ref >5.9)

## 2019-07-15 LAB — C-REACTIVE PROTEIN: CRP: 14.3 mg/dL — ABNORMAL HIGH (ref <1.0)

## 2019-07-15 LAB — FERRITIN: Ferritin: 70 ng/mL (ref 24–336)

## 2019-07-15 MED ORDER — METOPROLOL TARTRATE 25 MG PO TABS
12.5000 mg | ORAL_TABLET | Freq: Two times a day (BID) | ORAL | Status: DC
  Administered 2019-07-16 – 2019-07-18 (×5): 12.5 mg via ORAL
  Filled 2019-07-15 (×5): qty 1

## 2019-07-15 NOTE — Plan of Care (Signed)
Rested well during the night. Currently on 3L [place] and sating 95%. Still having pain to right AKA from previous surgery, treating with prn meds. Safety precautions in place Problem: Education: Goal: Knowledge of risk factors and measures for prevention of condition will improve Outcome: Progressing   Problem: Coping: Goal: Psychosocial and spiritual needs will be supported Outcome: Progressing   Problem: Respiratory: Goal: Will maintain a patent airway Outcome: Progressing Goal: Complications related to the disease process, condition or treatment will be avoided or minimized Outcome: Progressing

## 2019-07-15 NOTE — Progress Notes (Signed)
PT Cancellation Note  Patient Details Name: Gregory [name] MRN: [id number] DOB: 09-27-1960   Cancelled Treatment:    Reason Eval/Treat Not Completed: Pain limiting ability to participate, patient reports residual right limb pain.    Claretha Cooper 07/15/2019, 3:43 PM [place]  Office [phone]

## 2019-07-15 NOTE — Progress Notes (Addendum)
PROGRESS NOTE    Gregory [name]  KXF:[id number] DOB: 1961-02-10 DOA: 07/11/2019 PCP: [name], MD   Brief Narrative:  59 year old male with DM 2, status post right lower extremity amputation with multiple revisions most recent 18 June 2018, now AKA, HTN, COPD, chronic pain, hyperlipidemia, GERD, hospitalized several times since December all related to chronic osteomyelitis and chronic wound infection, currently at [facility], there have been apparently multiple cases of Covid over there and came into the hospital with progressive shortness of breath over the last 5 days that has been getting worse.  On admission required 15 L nasal cannula, currently on 3 L.  Assessment & Plan:   Principal Problem:   Acute respiratory failure with hypoxia (HCC) Active Problems:   COPD (chronic obstructive pulmonary disease) (HCC)   Pulmonary hypertension (HCC)   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HCC)   Above knee amputation of right lower extremity (HCC)   Diabetic polyneuropathy associated with type 2 diabetes mellitus (HCC)   CKD (chronic kidney disease), stage III   Acute respiratory disease due to COVID-19 virus  Acute Hypoxic Respiratory Failure due to Covid-19 Viral Illness/COVID-19 pneumonia -Remdesivir, completed 1/27 -On steroids Solu-Medrol 40 mg every 12 (day 5) -Supportive treatment with inhalers, antitussives, incentive spirometer, flutter valve -Status post convalescent plasma on 1/23 -Actemra 1/26 -I/O, daily weights - LE [place] without DVT  COVID-19 Labs  Recent Labs    07/13/19 0300 07/14/19 0157 07/15/19 0248  DDIMER 3.79* 3.40* 3.67*  FERRITIN 84 74 70  CRP 13.7* 21.2* 14.3*    Lab Results  Component Value Date   SARSCOV2NAA NEGATIVE 06/25/2019   [place] NEGATIVE 06/22/2019   [place] NEGATIVE 05/30/2019   [place] NEGATIVE 05/23/2019   Underlying COPD -No wheezing, currently on steroids, continue inhalers  DM 2, with  hyperglycemia -Placed on Levemir, NovoLog scheduled plus sliding scale, had an episode of hypoglycemia 1/20 at 5 AM.  BG's reasonable.   Essential hypertension -Continue Norvasc, metoprolol, blood pressure remained stable  Chronic kidney disease stage IIIb -Baseline creatinine 1.3-2.1, creatinine improved today 1.59  Anemia -Of chronic kidney disease, no bleeding, continue to monitor -follow iron, b12, folate, ferritin  GERD -Continue PPI  Chronic osteomyelitis/lower extremity wounds -Status post revision by [name] at the beginning of January 2020 -Currently on Keflex and clindamycin based on prior microbiology with E. coli as well as MRSA, was discharged on 1/9 with plans for 21 days of antibiotics -Scheduled to finish antibiotics on 1/30  BPH -Continue tamsulosin, oxybutynin  Depression -Continue paroxetine  Bradycardia - Decrease metop dose, add holding parameters  DVT prophylaxis: lovenox Code Status: full  Family Communication: none at bedside - daughter, Joellen [place] Disposition Plan:  . Patient came from: [facility]            . Anticipated d/c place: pending PT/OT eval . Barriers to d/c OR conditions which need to be met to effect Azaliah Carrero safe d/c: improved oxygen requirement, PT/OT eval   Consultants:   none  Procedures:  LE [place] Summary:  Right: There is no evidence of deep vein thrombosis in the lower  extremity.  Left: There is no evidence of deep vein thrombosis in the lower extremity.  However, portions of this examination were limited- see technologist  comments above. No cystic structure found in the popliteal fossa.   Antimicrobials:  Anti-infectives (From admission, onward)   Start     Dose/Rate Route Frequency Ordered Stop   07/12/19 1600  cephALEXin (KEFLEX) capsule 500 mg  500 mg Oral 3 times daily 07/12/19 1034 07/17/19 2359   07/12/19 1600  clindamycin (CLEOCIN) capsule 300 mg     300 mg Oral 3 times daily 07/12/19 1034 07/17/19 2359    07/12/19 1000  remdesivir 100 mg in sodium chloride 0.9 % 100 mL IVPB     100 mg 200 mL/hr over 30 Minutes Intravenous Daily 07/11/19 1121 07/15/19 0856   07/12/19 1000  terbinafine (LAMISIL) tablet 250 mg     250 mg Oral Daily 07/11/19 1312     07/11/19 1600  cephALEXin (KEFLEX) capsule 500 mg  Status:  Discontinued     500 mg Oral 3 times daily 07/11/19 1312 07/12/19 1034   07/11/19 1600  clindamycin (CLEOCIN) capsule 300 mg  Status:  Discontinued     300 mg Oral 3 times daily 07/11/19 1312 07/12/19 1034   07/11/19 1200  remdesivir 200 mg in sodium chloride 0.9% 250 mL IVPB     200 mg 580 mL/hr over 30 Minutes Intravenous Once 07/11/19 1121 07/11/19 1518     Subjective: C/o RLE pain Breathing feels ok  Objective: Vitals:   07/14/19 2144 07/15/19 0042 07/15/19 0425 07/15/19 0815  BP:   140/74 (!) 144/69  Pulse:   (!) 50 (!) 50  Resp:   18   Temp:   97.8 F (36.6 C) 97.9 F (36.6 C)  TempSrc:   Axillary Axillary  SpO2: 92% 92% 94% 95%  Weight:      Height:        Intake/Output Summary (Last 24 hours) at 07/15/2019 1131 Last data filed at 07/15/2019 0500 Gross per 24 hour  Intake 320 ml  Output 1295 ml  Net -975 ml   Filed Weights   07/11/19 2211  Weight: 112 kg    Examination:  General exam: Appears calm and comfortable  Respiratory system: Clear to auscultation. Respiratory effort normal. Cardiovascular system: S1 & S2 heard, RRR. Gastrointestinal system: Abdomen is nondistended, soft and nontender.  Central nervous system: Alert and oriented. No focal neurological deficits. Extremities: R AKA with dressing intact Skin: No rashes, lesions or ulcers Psychiatry: Judgement and insight appear normal. Mood & affect appropriate.     Data Reviewed: I have personally reviewed following labs and imaging studies  CBC: Recent Labs  Lab 07/11/19 1023 07/11/19 1023 07/12/19 0920 07/12/19 1300 07/13/19 0300 07/14/19 0157 07/15/19 0248  WBC 5.1  --  5.3  --  8.5  6.5 4.4  NEUTROABS 4.2  --  4.4  --  6.7 5.6 3.3  HGB 8.2*   < > 6.6* 6.9* 8.6* 7.9* 8.1*  HCT 28.4*   < > 22.7* 22.9* 29.8* 27.5* 27.5*  MCV 85.8  --  84.4  --  86.1 86.8 83.8  PLT 214  --  205  --  244 247 251   < > = values in this interval not displayed.   Basic Metabolic Panel: Recent Labs  Lab 07/11/19 1023 07/12/19 0920 07/13/19 0300 07/14/19 0157 07/15/19 0248  NA 138 138 139 140 140  K 4.5 4.8 4.6 5.3* 4.8  CL 102 103 102 102 103  CO2 _0 GLUCOSE 223* 216* 77 167* 152*  BUN 37* 38* 41* 41* 40*  CREATININE 2.09* 1.81* 1.98* 1.79* 1.59*  CALCIUM 8.2* 8.2* 8.7* 8.5* 8.9  MG  --  2.1 2.3 2.3 2.5*   GFR: Estimated Creatinine Clearance: 63.9 mL/min (Orlanda Frankum) (by C-G formula based on SCr of 1.59 mg/dL (H)). Liver Function  Tests: Recent Labs  Lab 07/11/19 1023 07/12/19 0920 07/13/19 0300 07/14/19 0157 07/15/19 0248  AST _0 14*  ALT _1 ALKPHOS 80 71 86 72 72  BILITOT 0.5 0.2* 0.4 0.8 0.5  PROT 6.9 6.9 8.0 7.3 7.1  ALBUMIN 2.6* 2.6* 3.0* 2.7* 2.6*   No results for input(s): LIPASE, AMYLASE in the last 168 hours. No results for input(s): AMMONIA in the last 168 hours. Coagulation Profile: No results for input(s): INR, PROTIME in the last 168 hours. Cardiac Enzymes: No results for input(s): CKTOTAL, CKMB, CKMBINDEX, TROPONINI in the last 168 hours. BNP (last 3 results) No results for input(s): PROBNP in the last 8760 hours. HbA1C: No results for input(s): HGBA1C in the last 72 hours. CBG: Recent Labs  Lab 07/14/19 1557 07/14/19 1919 07/15/19 0041 07/15/19 0425 07/15/19 0756  GLUCAP 202* 176* 158* 152* 202*   Lipid Profile: No results for input(s): CHOL, HDL, LDLCALC, TRIG, CHOLHDL, LDLDIRECT in the last 72 hours. Thyroid Function Tests: No results for input(s): TSH, T4TOTAL, FREET4, T3FREE, THYROIDAB in the last 72 hours. Anemia Panel: Recent Labs    07/14/19 0157 07/15/19 0248  FERRITIN 74 70   Sepsis Labs: Recent  Labs  Lab 07/11/19 1023 07/12/19 0920  PROCALCITON 0.15 0.14  LATICACIDVEN 1.2  --     Recent Results (from the past 240 hour(s))  Blood Culture (routine x 2)     Status: None (Preliminary result)   Collection Time: 07/11/19 10:22 AM   Specimen: BLOOD  Result Value Ref Range Status   Specimen Description BLOOD RIGHT ANTECUBITAL  Final   Special Requests   Final    BOTTLES DRAWN AEROBIC AND ANAEROBIC Blood Culture adequate volume   Culture   Final    NO GROWTH 4 DAYS Performed at [facility], [place] [address] [place], [place] [address]    Report Status PENDING  Incomplete  Blood Culture (routine x 2)     Status: None (Preliminary result)   Collection Time: 07/11/19 11:10 AM   Specimen: BLOOD RIGHT HAND  Result Value Ref Range Status   Specimen Description BLOOD RIGHT HAND  Final   Special Requests   Final    BOTTLES DRAWN AEROBIC AND ANAEROBIC Blood Culture results may not be optimal due to an inadequate volume of blood received in culture bottles   Culture   Final    NO GROWTH 4 DAYS Performed at [facility], [place] [address], [place], [place] [address]    Report Status PENDING  Incomplete         Radiology Studies: VAS [place] LOWER EXTREMITY VENOUS (DVT)  Result Date: 07/14/2019  Lower Venous Study Indications: Elevated D dimer.  Limitations: Body habitus. Performing Technologist: Antonieta Pert RDMS, RVT  Examination Guidelines: Nyheim Seufert complete evaluation includes B-mode imaging, spectral Doppler, color Doppler, and power Doppler as needed of all accessible portions of each vessel. Bilateral testing is considered an integral part of Carnella Fryman complete examination. Limited examinations for reoccurring indications may be performed as noted.  +-------+---------------+---------+-----------+----------+--------------+ RIGHT  CompressibilityPhasicitySpontaneityPropertiesThrombus Aging +-------+---------------+---------+-----------+----------+--------------+ CFV    Full                                                         +-------+---------------+---------+-----------+----------+--------------+ SFJ    Full                                                        +-------+---------------+---------+-----------+----------+--------------+  FV ProxFull                                                        +-------+---------------+---------+-----------+----------+--------------+ enlarged lymph node 1.8 x 2.8cm  +---------+---------------+---------+-----------+----------+-------------------+ LEFT     CompressibilityPhasicitySpontaneityPropertiesThrombus Aging      +---------+---------------+---------+-----------+----------+-------------------+ CFV      Full                                                             +---------+---------------+---------+-----------+----------+-------------------+ SFJ      Full                                                             +---------+---------------+---------+-----------+----------+-------------------+ FV Prox  Full                                                             +---------+---------------+---------+-----------+----------+-------------------+ FV Mid   Full                                                             +---------+---------------+---------+-----------+----------+-------------------+ FV DistalFull                                                             +---------+---------------+---------+-----------+----------+-------------------+ PFV      Full                                                             +---------+---------------+---------+-----------+----------+-------------------+ POP      Full                                                             +---------+---------------+---------+-----------+----------+-------------------+ PTV                                                   unable to compress,  poor visualization  +---------+---------------+---------+-----------+----------+-------------------+ PERO                                                  unable to compress,                                                       poor visualization  +---------+---------------+---------+-----------+----------+-------------------+ GSV      Full                                                             +---------+---------------+---------+-----------+----------+-------------------+     Summary: Right: There is no evidence of deep vein thrombosis in the lower extremity. Left: There is no evidence of deep vein thrombosis in the lower extremity. However, portions of this examination were limited- see technologist comments above. No cystic structure found in the popliteal fossa.  *See table(s) above for measurements and observations. Electronically signed by Deitra Mayo MD on 07/14/2019 at 4:07:35 PM.    Final         Scheduled Meds: . sodium chloride   Intravenous Once  . amLODipine  10 mg Oral Daily  . vitamin C  500 mg Oral Daily  . atorvastatin  20 mg Oral QHS  . buPROPion  300 mg Oral QHS  . calcium carbonate  1 tablet Oral TID WC  . cephALEXin  500 mg Oral TID  . clindamycin  300 mg Oral TID  . enoxaparin (LOVENOX) injection  55 mg Subcutaneous Q24H  . finasteride  5 mg Oral Daily  . fluticasone  2 spray Each Nare Daily  . insulin aspart  0-20 Units Subcutaneous Q4H  . insulin aspart  14 Units Subcutaneous TID WC  . insulin detemir  7 Units Subcutaneous BID  . lidocaine  1 patch Transdermal Q24H  . linagliptin  5 mg Oral Daily  . magnesium oxide  400 mg Oral BID  . methylPREDNISolone (SOLU-MEDROL) injection  40 mg Intravenous Q12H  . metoprolol tartrate  12.5 mg Oral BID  . mirabegron ER  50 mg Oral Daily  . oxybutynin  15 mg Oral QHS  . pantoprazole  40 mg Oral Daily  . PARoxetine  20 mg Oral QHS  . pregabalin  300 mg Oral BID  .  senna-docusate  2 tablet Oral QHS  . tamsulosin  0.4 mg Oral QPM  . terbinafine  250 mg Oral Daily  . umeclidinium bromide  1 puff Inhalation Daily  . zinc sulfate  220 mg Oral Daily   Continuous Infusions:   LOS: 4 days    Time spent: over 30 min    Fayrene Helper, MD [facility]   To contact the attending provider between 7A-7P or the covering provider during after hours 7P-7A, please log into the web site [email or web address] and access using universal [place] password for that web site. If you do not have the password, please call the hospital operator.  07/15/2019, 11:31 AM

## 2019-07-16 LAB — GLUCOSE, CAPILLARY
Glucose-Capillary: 149 mg/dL — ABNORMAL HIGH (ref 70–99)
Glucose-Capillary: 161 mg/dL — ABNORMAL HIGH (ref 70–99)
Glucose-Capillary: 185 mg/dL — ABNORMAL HIGH (ref 70–99)
Glucose-Capillary: 244 mg/dL — ABNORMAL HIGH (ref 70–99)
Glucose-Capillary: 259 mg/dL — ABNORMAL HIGH (ref 70–99)
Glucose-Capillary: 283 mg/dL — ABNORMAL HIGH (ref 70–99)

## 2019-07-16 LAB — CULTURE, BLOOD (ROUTINE X 2)
Culture: NO GROWTH
Culture: NO GROWTH
Special Requests: ADEQUATE

## 2019-07-16 MED ORDER — FERROUS SULFATE 325 (65 FE) MG PO TABS
325.0000 mg | ORAL_TABLET | Freq: Every day | ORAL | Status: DC
  Administered 2019-07-17 – 2019-07-18 (×2): 325 mg via ORAL
  Filled 2019-07-16 (×3): qty 1

## 2019-07-16 NOTE — Progress Notes (Signed)
PT Cancellation Note  Patient Details Name: Gregory [name] MRN: [id number] DOB: 1961/04/25   Cancelled Treatment:    Reason Eval/Treat Not Completed: Pain limiting ability to participate(He refused and was very agitated.)  Rollen Sox, PT # [phone] CGV cell  Casandra Doffing 07/16/2019, 5:02 PM

## 2019-07-16 NOTE — Progress Notes (Signed)
Patient arrives to room 312 via wheelchair, accompanied by RN. Patient is A&O X4. Right AKA, 1 assist pivot to bedside recliner. RA. Orienting patient to room and floor at this time.

## 2019-07-16 NOTE — Progress Notes (Signed)
OT Cancellation Note  Patient Details Name: JACORIEN SIPLE MRN: [id number] DOB: 05/11/1961   Cancelled Treatment:    Reason Eval/Treat Not Completed: Other (comment). Pt declined to participate with OT today.   Vestal Markin,HILLARY 07/16/2019, 5:16 PM  Maurie Boettcher, OT/L   Acute OT Clinical Specialist Acute Rehabilitation Services Pager [phone] Office [phone]

## 2019-07-16 NOTE — Plan of Care (Signed)
Pt down to 2L [place], tolerating well. Did not sleep well during the night, complaints of pain constantly. Treated prn with meds. Currently sleeping. Safety precautions in place Problem: Education: Goal: Knowledge of risk factors and measures for prevention of condition will improve Outcome: Progressing   Problem: Coping: Goal: Psychosocial and spiritual needs will be supported Outcome: Progressing   Problem: Respiratory: Goal: Will maintain a patent airway Outcome: Progressing Goal: Complications related to the disease process, condition or treatment will be avoided or minimized Outcome: Progressing

## 2019-07-16 NOTE — Plan of Care (Addendum)
Pt A&Ox4, drowsy during shift and became agitated when check for sedation level.  Educated pt regard appropriate use of pain medication with dependence and alternatives to pain relief. Pt expressed understanding however, promptly called for pain medication when due. Pt now on room air, sating 94-98%. Dressing changed x2 to left ampt. AKA. MD aware of drainage. Pt refused lab draw MD aware schedule for tomorrow am. Will continue to monitor.   Problem: Education: Goal: Knowledge of risk factors and measures for prevention of condition will improve Outcome: Progressing   Problem: Coping: Goal: Psychosocial and spiritual needs will be supported Outcome: Progressing   Problem: Respiratory: Goal: Will maintain a patent airway Outcome: Progressing Goal: Complications related to the disease process, condition or treatment will be avoided or minimized Outcome: Progressing

## 2019-07-16 NOTE — Progress Notes (Addendum)
PROGRESS NOTE    Gregory [name]  HUD:[id number] DOB: 11/25/1960 DOA: 07/11/2019 PCP: [name], MD   Brief Narrative:  59 year old male with DM 2, status post right lower extremity amputation with multiple revisions most recent 18 June 2018, now AKA, HTN, COPD, chronic pain, hyperlipidemia, GERD, hospitalized several times since December all related to chronic osteomyelitis and chronic wound infection, currently at [facility], there have been apparently multiple cases of Covid over there and came into the hospital with progressive shortness of breath over the last 5 days that has been getting worse.  On admission required 15 L nasal cannula, currently on 3 L.  Assessment & Plan:   Principal Problem:   Acute respiratory failure with hypoxia (HCC) Active Problems:   COPD (chronic obstructive pulmonary disease) (HCC)   Pulmonary hypertension (HCC)   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HCC)   Above knee amputation of right lower extremity (HCC)   Diabetic polyneuropathy associated with type 2 diabetes mellitus (HCC)   CKD (chronic kidney disease), stage III   Acute respiratory disease due to COVID-19 virus  Acute Hypoxic Respiratory Failure due to Covid-19 Viral Illness/COVID-19 pneumonia -Remdesivir, completed 1/27 -On steroids Solu-Medrol 40 mg every 12 (day 6) -Supportive treatment with inhalers, antitussives, incentive spirometer, flutter valve -Status post convalescent plasma on 1/23 -Actemra 1/26 -I/O, daily weights -LE [place] without DVT  COVID-19 Labs  Recent Labs    07/14/19 0157 07/15/19 0248  DDIMER 3.40* 3.67*  FERRITIN 74 70  CRP 21.2* 14.3*    Lab Results  Component Value Date   SARSCOV2NAA NEGATIVE 06/25/2019   [place] NEGATIVE 06/22/2019   [place] NEGATIVE 05/30/2019   [place] NEGATIVE 05/23/2019   Underlying COPD -No wheezing, currently on steroids, continue inhalers  DM 2, with hyperglycemia -Placed on Levemir,  NovoLog scheduled plus sliding scale, had an episode of hypoglycemia 1/20 at 5 AM.  BG's reasonable.   Essential hypertension -Continue Norvasc, metoprolol, blood pressure remained stable  Chronic kidney disease stage IIIb -Baseline creatinine 1.3-2.1, creatinine improved today 1.59  Anemia -Of chronic kidney disease, no bleeding, continue to monitor -follow iron, b12, folate, ferritin - low iron and % sat, component of iron def anemia as well - nl b12 and folate  GERD -Continue PPI  Chronic osteomyelitis/lower extremity wounds -Status post revision by [name] at the beginning of January 2020 -Currently on Keflex and clindamycin based on prior microbiology with E. coli as well as MRSA, was discharged on 1/9 with plans for 21 days of antibiotics -Scheduled to finish antibiotics on 1/30 -Sutures should remain in place "at least 4 weeks" - would discuss with [name] around this time -he has drainage from the RLE stump, cloudy.  Discussed with [name] 1/28, will consider extending abx duration after completion.  Of note, per our discussion, pt sometimes manipulates wound for unclear reasons, so risk of infection is high, will continue to monitor on abx.  BPH -Continue tamsulosin, oxybutynin  Depression -Continue paroxetine  Bradycardia - Decrease metop dose, add holding parameters  DVT prophylaxis: lovenox Code Status: full  Family Communication: none at bedside - daughter, Joellen Jersey 1/28 Disposition Plan:  . Patient came from: [facility]            . Anticipated d/c place: pending PT/OT eval . Barriers to d/c OR conditions which need to be met to effect Madlyn Crosby safe d/c: improved oxygen requirement, PT/OT eval   Consultants:   none  Procedures:  LE [place] Summary:  Right: There  is no evidence of deep vein thrombosis in the lower  extremity.  Left: There is no evidence of deep vein thrombosis in the lower extremity.  However, portions of this examination were limited- see  technologist  comments above. No cystic structure found in the popliteal fossa.   Antimicrobials:  Anti-infectives (From admission, onward)   Start     Dose/Rate Route Frequency Ordered Stop   07/12/19 1600  cephALEXin (KEFLEX) capsule 500 mg     500 mg Oral 3 times daily 07/12/19 1034 07/17/19 2359   07/12/19 1600  clindamycin (CLEOCIN) capsule 300 mg     300 mg Oral 3 times daily 07/12/19 1034 07/17/19 2359   07/12/19 1000  remdesivir 100 mg in sodium chloride 0.9 % 100 mL IVPB     100 mg 200 mL/hr over 30 Minutes Intravenous Daily 07/11/19 1121 07/15/19 0900   07/12/19 1000  terbinafine (LAMISIL) tablet 250 mg     250 mg Oral Daily 07/11/19 1312     07/11/19 1600  cephALEXin (KEFLEX) capsule 500 mg  Status:  Discontinued     500 mg Oral 3 times daily 07/11/19 1312 07/12/19 1034   07/11/19 1600  clindamycin (CLEOCIN) capsule 300 mg  Status:  Discontinued     300 mg Oral 3 times daily 07/11/19 1312 07/12/19 1034   07/11/19 1200  remdesivir 200 mg in sodium chloride 0.9% 250 mL IVPB     200 mg 580 mL/hr over 30 Minutes Intravenous Once 07/11/19 1121 07/11/19 1518     Subjective: Breathing is fine, c/o RLE pain  Objective: Vitals:   07/16/19 0211 07/16/19 0330 07/16/19 0731 07/16/19 0800  BP:  (!) 154/73 (!) 165/86 (!) 155/83  Pulse:  78  (!) 56  Resp:  20 18   Temp:  98.2 F (36.8 C) 97.8 F (36.6 C)   TempSrc:  Axillary Oral   SpO2: 90% 95%  94%  Weight:      Height:        Intake/Output Summary (Last 24 hours) at 07/16/2019 1110 Last data filed at 07/16/2019 1000 Gross per 24 hour  Intake 720 ml  Output 1500 ml  Net -780 ml   Filed Weights   07/11/19 2211  Weight: 112 kg    Examination:  General: No acute distress. Cardiovascular: Heart sounds show Hermie Reagor regular rate, and rhythm.  Lungs: Clear to auscultation bilaterally Abdomen: Soft, nontender, nondistended  Neurological: Alert and oriented 3. Moves all extremities 4. Cranial nerves II through XII grossly  intact. Skin: Warm and dry. No rashes or lesions. Extremities: RLE stump with drainage from incision     Data Reviewed: I have personally reviewed following labs and imaging studies  CBC: Recent Labs  Lab 07/11/19 1023 07/11/19 1023 07/12/19 0920 07/12/19 1300 07/13/19 0300 07/14/19 0157 07/15/19 0248  WBC 5.1  --  5.3  --  8.5 6.5 4.4  NEUTROABS 4.2  --  4.4  --  6.7 5.6 3.3  HGB 8.2*   < > 6.6* 6.9* 8.6* 7.9* 8.1*  HCT 28.4*   < > 22.7* 22.9* 29.8* 27.5* 27.5*  MCV 85.8  --  84.4  --  86.1 86.8 83.8  PLT 214  --  205  --  244 247 251   < > = values in this interval not displayed.   Basic Metabolic Panel: Recent Labs  Lab 07/11/19 1023 07/12/19 0920 07/13/19 0300 07/14/19 0157 07/15/19 0248  NA 138 138 139 140 140  K 4.5 4.8 4.6  5.3* 4.8  CL 102 103 102 102 103  CO2 22 25 26 26 27   GLUCOSE 223* 216* 77 167* 152*  BUN 37* 38* 41* 41* 40*  CREATININE 2.09* 1.81* 1.98* 1.79* 1.59*  CALCIUM 8.2* 8.2* 8.7* 8.5* 8.9  MG  --  2.1 2.3 2.3 2.5*   GFR: Estimated Creatinine Clearance: 63.9 mL/min (Zeyna Mkrtchyan) (by C-G formula based on SCr of 1.59 mg/dL (H)). Liver Function Tests: Recent Labs  Lab 07/11/19 1023 07/12/19 0920 07/13/19 0300 07/14/19 0157 07/15/19 0248  AST 18 16 22 17  14*  ALT 11 11 12 10 10   ALKPHOS 80 71 86 72 72  BILITOT 0.5 0.2* 0.4 0.8 0.5  PROT 6.9 6.9 8.0 7.3 7.1  ALBUMIN 2.6* 2.6* 3.0* 2.7* 2.6*   No results for input(s): LIPASE, AMYLASE in the last 168 hours. No results for input(s): AMMONIA in the last 168 hours. Coagulation Profile: No results for input(s): INR, PROTIME in the last 168 hours. Cardiac Enzymes: No results for input(s): CKTOTAL, CKMB, CKMBINDEX, TROPONINI in the last 168 hours. BNP (last 3 results) No results for input(s): PROBNP in the last 8760 hours. HbA1C: No results for input(s): HGBA1C in the last 72 hours. CBG: Recent Labs  Lab 07/15/19 1608 07/15/19 1910 07/15/19 2331 07/16/19 0329 07/16/19 0729  GLUCAP 244*  193* 161* 149* 185*   Lipid Profile: No results for input(s): CHOL, HDL, LDLCALC, TRIG, CHOLHDL, LDLDIRECT in the last 72 hours. Thyroid Function Tests: No results for input(s): TSH, T4TOTAL, FREET4, T3FREE, THYROIDAB in the last 72 hours. Anemia Panel: Recent Labs    07/14/19 0157 07/15/19 0248  VITAMINB12  --  495  FOLATE  --  9.3  FERRITIN 74 70  TIBC  --  238*  IRON  --  22*   Sepsis Labs: Recent Labs  Lab 07/11/19 1023 07/12/19 0920  PROCALCITON 0.15 0.14  LATICACIDVEN 1.2  --     Recent Results (from the past 240 hour(s))  Blood Culture (routine x 2)     Status: None   Collection Time: 07/11/19 10:22 AM   Specimen: BLOOD  Result Value Ref Range Status   Specimen Description BLOOD RIGHT ANTECUBITAL  Final   Special Requests   Final    BOTTLES DRAWN AEROBIC AND ANAEROBIC Blood Culture adequate volume   Culture   Final    NO GROWTH 5 DAYS Performed at [facility], [place] [address] [place], [place] [address]    Report Status 07/16/2019 FINAL  Final  Blood Culture (routine x 2)     Status: None   Collection Time: 07/11/19 11:10 AM   Specimen: BLOOD RIGHT HAND  Result Value Ref Range Status   Specimen Description BLOOD RIGHT HAND  Final   Special Requests   Final    BOTTLES DRAWN AEROBIC AND ANAEROBIC Blood Culture results may not be optimal due to an inadequate volume of blood received in culture bottles   Culture   Final    NO GROWTH 5 DAYS Performed at [facility], [place] [address] [place], [place] [address]    Report Status 07/16/2019 FINAL  Final         Radiology Studies: VAS [place] LOWER EXTREMITY VENOUS (DVT)  Result Date: 07/14/2019  Lower Venous Study Indications: Elevated D dimer.  Limitations: Body habitus. Performing Technologist: Antonieta Pert RDMS, RVT  Examination Guidelines: [place] complete evaluation includes B-mode imaging, spectral Doppler, color Doppler, and power Doppler as needed of all accessible portions of each vessel.  Bilateral testing is considered an integral part of Birgit Nowling complete examination. Limited examinations for reoccurring indications may be performed as noted.  +-------+---------------+---------+-----------+----------+--------------+ RIGHT  CompressibilityPhasicitySpontaneityPropertiesThrombus Aging +-------+---------------+---------+-----------+----------+--------------+ CFV    Full                                                        +-------+---------------+---------+-----------+----------+--------------+ SFJ    Full                                                        +-------+---------------+---------+-----------+----------+--------------+ FV ProxFull                                                        +-------+---------------+---------+-----------+----------+--------------+ enlarged lymph node 1.8 x 2.8cm  +---------+---------------+---------+-----------+----------+-------------------+ LEFT     CompressibilityPhasicitySpontaneityPropertiesThrombus Aging      +---------+---------------+---------+-----------+----------+-------------------+ CFV      Full                                                             +---------+---------------+---------+-----------+----------+-------------------+ SFJ      Full                                                             +---------+---------------+---------+-----------+----------+-------------------+ FV Prox  Full                                                             +---------+---------------+---------+-----------+----------+-------------------+ FV Mid   Full                                                             +---------+---------------+---------+-----------+----------+-------------------+ FV DistalFull                                                             +---------+---------------+---------+-----------+----------+-------------------+ PFV      Full                                                              +---------+---------------+---------+-----------+----------+-------------------+  POP      Full                                                             +---------+---------------+---------+-----------+----------+-------------------+ PTV                                                   unable to compress,                                                       poor visualization  +---------+---------------+---------+-----------+----------+-------------------+ PERO                                                  unable to compress,                                                       poor visualization  +---------+---------------+---------+-----------+----------+-------------------+ GSV      Full                                                             +---------+---------------+---------+-----------+----------+-------------------+     Summary: Right: There is no evidence of deep vein thrombosis in the lower extremity. Left: There is no evidence of deep vein thrombosis in the lower extremity. However, portions of this examination were limited- see technologist comments above. No cystic structure found in the popliteal fossa.  *See table(s) above for measurements and observations. Electronically signed by Deitra Mayo MD on 07/14/2019 at 4:07:35 PM.    Final         Scheduled Meds: . amLODipine  10 mg Oral Daily  . vitamin C  500 mg Oral Daily  . atorvastatin  20 mg Oral QHS  . buPROPion  300 mg Oral QHS  . calcium carbonate  1 tablet Oral TID WC  . cephALEXin  500 mg Oral TID  . clindamycin  300 mg Oral TID  . enoxaparin (LOVENOX) injection  55 mg Subcutaneous Q24H  . finasteride  5 mg Oral Daily  . fluticasone  2 spray Each Nare Daily  . insulin aspart  0-20 Units Subcutaneous Q4H  . insulin aspart  14 Units Subcutaneous TID WC  . insulin detemir  7 Units Subcutaneous BID  . lidocaine  1 patch Transdermal  Q24H  . linagliptin  5 mg Oral Daily  . magnesium oxide  400 mg Oral BID  . methylPREDNISolone (SOLU-MEDROL) injection  40 mg Intravenous Q12H  . metoprolol tartrate  12.5 mg Oral BID  .  mirabegron ER  50 mg Oral Daily  . oxybutynin  15 mg Oral QHS  . pantoprazole  40 mg Oral Daily  . PARoxetine  20 mg Oral QHS  . pregabalin  300 mg Oral BID  . senna-docusate  2 tablet Oral QHS  . tamsulosin  0.4 mg Oral QPM  . terbinafine  250 mg Oral Daily  . umeclidinium bromide  1 puff Inhalation Daily  . zinc sulfate  220 mg Oral Daily   Continuous Infusions:   LOS: 5 days    Time spent: over 30 min    Fayrene Helper, MD [facility]   To contact the attending provider between 7A-7P or the covering provider during after hours 7P-7A, please log into the web site [email or web address] and access using universal [place] password for that web site. If you do not have the password, please call the hospital operator.  07/16/2019, 11:10 AM

## 2019-07-17 LAB — GLUCOSE, CAPILLARY
Glucose-Capillary: 148 mg/dL — ABNORMAL HIGH (ref 70–99)
Glucose-Capillary: 159 mg/dL — ABNORMAL HIGH (ref 70–99)
Glucose-Capillary: 188 mg/dL — ABNORMAL HIGH (ref 70–99)
Glucose-Capillary: 234 mg/dL — ABNORMAL HIGH (ref 70–99)
Glucose-Capillary: 282 mg/dL — ABNORMAL HIGH (ref 70–99)

## 2019-07-17 MED ORDER — MAGNESIUM OXIDE 400 (241.3 MG) MG PO TABS
400.0000 mg | ORAL_TABLET | Freq: Three times a day (TID) | ORAL | Status: DC
  Administered 2019-07-18: 400 mg via ORAL
  Filled 2019-07-17: qty 1

## 2019-07-17 MED ORDER — DEXAMETHASONE 6 MG PO TABS
6.0000 mg | ORAL_TABLET | Freq: Every day | ORAL | Status: DC
  Administered 2019-07-17: 6 mg via ORAL
  Filled 2019-07-17: qty 1

## 2019-07-17 NOTE — Plan of Care (Signed)
  Problem: Coping: Goal: Psychosocial and spiritual needs will be supported 07/17/2019 1831 by Dawayne Cirri, RN Outcome: Progressing 07/17/2019 1830 by Dawayne Cirri, RN Outcome: Progressing   Problem: Respiratory: Goal: Complications related to the disease process, condition or treatment will be avoided or minimized 07/17/2019 1831 by Dawayne Cirri, RN Outcome: Progressing 07/17/2019 1830 by Dawayne Cirri, RN Outcome: Progressing

## 2019-07-17 NOTE — Progress Notes (Signed)
Patient continues to refuse lab draws late this morning. MD made aware.

## 2019-07-17 NOTE — Progress Notes (Addendum)
PROGRESS NOTE    Gregory [name]  OTL:[id number] DOB: 1960-10-29 DOA: 07/11/2019 PCP: [name], MD   Brief Narrative:  59 year old male with DM 2, status post right lower extremity amputation with multiple revisions most recent 18 June 2018, now AKA, HTN, COPD, chronic pain, hyperlipidemia, GERD, hospitalized several times since December all related to chronic osteomyelitis and chronic wound infection, currently at [facility], there have been apparently multiple cases of Covid over there and came into the hospital with progressive shortness of breath over the last 5 days that has been getting worse.  On admission required 15 L nasal cannula, currently on 3 L.  Assessment & Plan:   Principal Problem:   Acute respiratory failure with hypoxia (HCC) Active Problems:   COPD (chronic obstructive pulmonary disease) (HCC)   Pulmonary hypertension (HCC)   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HCC)   Above knee amputation of right lower extremity (HCC)   Diabetic polyneuropathy associated with type 2 diabetes mellitus (HCC)   CKD (chronic kidney disease), stage III   Acute respiratory disease due to COVID-19 virus  Acute Hypoxic Respiratory Failure due to Covid-19 Viral Illness/COVID-19 pneumonia -Remdesivir, completed 1/27 -steroids (day 7) -Supportive treatment with inhalers, antitussives, incentive spirometer, flutter valve -Status post convalescent plasma on 1/23 -Actemra 1/26 -I/O, daily weights -LE [place] without DVT  COVID-19 Labs  Recent Labs    07/15/19 0248  DDIMER 3.67*  FERRITIN 70  CRP 14.3*    Lab Results  Component Value Date   SARSCOV2NAA NEGATIVE 06/25/2019   [place] NEGATIVE 06/22/2019   [place] NEGATIVE 05/30/2019   [place] NEGATIVE 05/23/2019   Underlying COPD -No wheezing, currently on steroids, continue inhalers  DM 2, with hyperglycemia -Placed on Levemir, NovoLog scheduled plus sliding scale, had an episode of  hypoglycemia 1/20 at 5 AM.  BG's reasonable.   Essential hypertension -Continue Norvasc, metoprolol, blood pressure remained stable  Chronic kidney disease stage IIIb -Baseline creatinine 1.3-2.1, creatinine improved today 1.59  Anemia -Of chronic kidney disease, no bleeding, continue to monitor -follow iron, b12, folate, ferritin - low iron and % sat, component of iron def anemia as well - nl b12 and folate  GERD -Continue PPI  Chronic osteomyelitis/lower extremity wounds -Status post revision by [name] at the beginning of January 2020 -Currently on Keflex and clindamycin based on prior microbiology with E. coli as well as MRSA, was discharged on 1/9 with plans for 21 days of antibiotics -Scheduled to finish antibiotics on 1/30 -Sutures should remain in place "at least 4 weeks" - would discuss with [name] around this time -he has drainage from the RLE stump, cloudy.  Discussed with [name] 1/28, will consider extending abx duration after completion.  Of note, per our discussion, pt sometimes manipulates wound for unclear reasons, so risk of infection is high, will continue to monitor on abx.  BPH -Continue tamsulosin, oxybutynin  Depression -Continue paroxetine  Bradycardia - Decrease metop dose, add holding parameters  Currently refusing labs, will continue to discuss  DVT prophylaxis: lovenox Code Status: full  Family Communication: none at bedside - daughter, Joellen [place] 1/28 - no answer 1/29 Disposition Plan:  . Patient came from: [facility]            . Anticipated d/c place: pending PT/OT eval -> back to SNF . Barriers to d/c OR conditions which need to be met to effect Shaeleigh Graw safe d/c: improved oxygen requirement, PT/OT eval   Consultants:   none  Procedures:  LE  [place] Summary:  Right: There is no evidence of deep vein thrombosis in the lower  extremity.  Left: There is no evidence of deep vein thrombosis in the lower extremity.  However, portions of this  examination were limited- see technologist  comments above. No cystic structure found in the popliteal fossa.   Antimicrobials:  Anti-infectives (From admission, onward)   Start     Dose/Rate Route Frequency Ordered Stop   07/12/19 1600  cephALEXin (KEFLEX) capsule 500 mg     500 mg Oral 3 times daily 07/12/19 1034 07/17/19 2359   07/12/19 1600  clindamycin (CLEOCIN) capsule 300 mg     300 mg Oral 3 times daily 07/12/19 1034 07/17/19 2359   07/12/19 1000  remdesivir 100 mg in sodium chloride 0.9 % 100 mL IVPB     100 mg 200 mL/hr over 30 Minutes Intravenous Daily 07/11/19 1121 07/15/19 0900   07/12/19 1000  terbinafine (LAMISIL) tablet 250 mg     250 mg Oral Daily 07/11/19 1312     07/11/19 1600  cephALEXin (KEFLEX) capsule 500 mg  Status:  Discontinued     500 mg Oral 3 times daily 07/11/19 1312 07/12/19 1034   07/11/19 1600  clindamycin (CLEOCIN) capsule 300 mg  Status:  Discontinued     300 mg Oral 3 times daily 07/11/19 1312 07/12/19 1034   07/11/19 1200  remdesivir 200 mg in sodium chloride 0.9% 250 mL IVPB     200 mg 580 mL/hr over 30 Minutes Intravenous Once 07/11/19 1121 07/11/19 1518     Subjective: C/o RLE pain No new complaints  Objective: Vitals:   07/16/19 1500 07/16/19 1600 07/17/19 0441 07/17/19 0736  BP:   (!) 166/74 (!) 166/92  Pulse: (!) 58 61 66 62  Resp:   12 19  Temp:   98.2 F (36.8 C) 98.2 F (36.8 C)  TempSrc:   Oral Oral  SpO2: 98% 100% 95% 96%  Weight:      Height:        Intake/Output Summary (Last 24 hours) at 07/17/2019 1429 Last data filed at 07/17/2019 0900 Gross per 24 hour  Intake 240 ml  Output 650 ml  Net -410 ml   Filed Weights   07/11/19 2211  Weight: 112 kg    Examination:  General: No acute distress. Cardiovascular: Heart sounds show Dom[employer] [place]isc[employer] regular rate, and rhythm. Lungs: Clear to auscultation bilaterally  Abdomen: Soft, nontender, nondistended  Neurological: Alert and oriented 3. Moves all extremities 4 . Cranial nerves  II through XII grossly intact. Skin: Warm and dry. No rashes or lesions. Extremities: RLE stump with drainage through dressing      Data Reviewed: I have personally reviewed following labs and imaging studies  CBC: Recent Labs  Lab 07/11/19 1023 07/11/19 1023 07/12/19 0920 07/12/19 1300 07/13/19 0300 07/14/19 0157 07/15/19 0248  WBC 5.1  --  5.3  --  8.5 6.5 4.4  NEUTROABS 4.2  --  4.4  --  6.7 5.6 3.3  HGB 8.2*   < > 6.6* 6.9* 8.6* 7.9* 8.1*  HCT 28.4*   < > 22.7* 22.9* 29.8* 27.5* 27.5*  MCV 85.8  --  84.4  --  86.1 86.8 83.8  PLT 214  --  205  --  244 247 251   < > = values in this interval not displayed.   Basic Metabolic Panel: Recent Labs  Lab 07/11/19 1023 07/12/19 0920 07/13/19 0300 07/14/19 0157 07/15/19 0248  NA 138 138 139 140  140  K 4.5 4.8 4.6 5.3* 4.8  CL 102 103 102 102 103  CO2 _0 GLUCOSE 223* 216* 77 167* 152*  BUN 37* 38* 41* 41* 40*  CREATININE 2.09* 1.81* 1.98* 1.79* 1.59*  CALCIUM 8.2* 8.2* 8.7* 8.5* 8.9  MG  --  2.1 2.3 2.3 2.5*   GFR: Estimated Creatinine Clearance: 63.9 mL/min (Aaria Happ) (by C-G formula based on SCr of 1.59 mg/dL (H)). Liver Function Tests: Recent Labs  Lab 07/11/19 1023 07/12/19 0920 07/13/19 0300 07/14/19 0157 07/15/19 0248  AST _1 14*  ALT _2 ALKPHOS 80 71 86 72 72  BILITOT 0.5 0.2* 0.4 0.8 0.5  PROT 6.9 6.9 8.0 7.3 7.1  ALBUMIN 2.6* 2.6* 3.0* 2.7* 2.6*   No results for input(s): LIPASE, AMYLASE in the last 168 hours. No results for input(s): AMMONIA in the last 168 hours. Coagulation Profile: No results for input(s): INR, PROTIME in the last 168 hours. Cardiac Enzymes: No results for input(s): CKTOTAL, CKMB, CKMBINDEX, TROPONINI in the last 168 hours. BNP (last 3 results) No results for input(s): PROBNP in the last 8760 hours. HbA1C: No results for input(s): HGBA1C in the last 72 hours. CBG: Recent Labs  Lab 07/16/19 1606 07/16/19 2128 07/16/19 2327 07/17/19 0441  07/17/19 1117  GLUCAP 283* 244* 234* 159* 188*   Lipid Profile: No results for input(s): CHOL, HDL, LDLCALC, TRIG, CHOLHDL, LDLDIRECT in the last 72 hours. Thyroid Function Tests: No results for input(s): TSH, T4TOTAL, FREET4, T3FREE, THYROIDAB in the last 72 hours. Anemia Panel: Recent Labs    07/15/19 0248  VITAMINB12 495  FOLATE 9.3  FERRITIN 70  TIBC 238*  IRON 22*   Sepsis Labs: Recent Labs  Lab 07/11/19 1023 07/12/19 0920  PROCALCITON 0.15 0.14  LATICACIDVEN 1.2  --     Recent Results (from the past 240 hour(s))  Blood Culture (routine x 2)     Status: None   Collection Time: 07/11/19 10:22 AM   Specimen: BLOOD  Result Value Ref Range Status   Specimen Description BLOOD RIGHT ANTECUBITAL  Final   Special Requests   Final    BOTTLES DRAWN AEROBIC AND ANAEROBIC Blood Culture adequate volume   Culture   Final    NO GROWTH 5 DAYS Performed at [facility], [address], [place], [place] [address]    Report Status 07/16/2019 FINAL  Final  Blood Culture (routine x 2)     Status: None   Collection Time: 07/11/19 11:10 AM   Specimen: BLOOD RIGHT HAND  Result Value Ref Range Status   Specimen Description BLOOD RIGHT HAND  Final   Special Requests   Final    BOTTLES DRAWN AEROBIC AND ANAEROBIC Blood Culture results may not be optimal due to an inadequate volume of blood received in culture bottles   Culture   Final    NO GROWTH 5 DAYS Performed at [facility], [place] [address], [place], [place] [address]    Report Status 07/16/2019 FINAL  Final         Radiology Studies: No results found.      Scheduled Meds: . amLODipine  10 mg Oral Daily  . vitamin C  500 mg Oral Daily  . atorvastatin  20 mg Oral QHS  . buPROPion  300 mg Oral QHS  . calcium carbonate  1 tablet Oral TID WC  . cephALEXin  500 mg Oral TID  .  clindamycin  300 mg Oral TID  . enoxaparin (LOVENOX) injection  55 mg Subcutaneous Q24H  . ferrous sulfate  325 mg Oral Q  breakfast  . finasteride  5 mg Oral Daily  . fluticasone  2 spray Each Nare Daily  . insulin aspart  0-20 Units Subcutaneous Q4H  . insulin aspart  14 Units Subcutaneous TID WC  . insulin detemir  7 Units Subcutaneous BID  . lidocaine  1 patch Transdermal Q24H  . linagliptin  5 mg Oral Daily  . magnesium oxide  400 mg Oral BID  . methylPREDNISolone (SOLU-MEDROL) injection  40 mg Intravenous Q12H  . metoprolol tartrate  12.5 mg Oral BID  . mirabegron ER  50 mg Oral Daily  . oxybutynin  15 mg Oral QHS  . pantoprazole  40 mg Oral Daily  . PARoxetine  20 mg Oral QHS  . pregabalin  300 mg Oral BID  . senna-docusate  2 tablet Oral QHS  . tamsulosin  0.4 mg Oral QPM  . terbinafine  250 mg Oral Daily  . umeclidinium bromide  1 puff Inhalation Daily  . zinc sulfate  220 mg Oral Daily   Continuous Infusions:   LOS: 6 days    Time spent: over 30 min    Fayrene Helper, MD [facility]   To contact the attending provider between 7A-7P or the covering provider during after hours 7P-7A, please log into the web site [email or web address] and access using universal Coin password for that web site. If you do not have the password, please call the hospital operator.  07/17/2019, 2:29 PM

## 2019-07-17 NOTE — Progress Notes (Signed)
Physical Therapy Evaluation Patient Details Name: Gregory [name] MRN: [id number] DOB: March 01, 1961 Today's Date: 07/17/2019   History of Present Illness  59 y.o. 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 Pt is now s/p revision of R AKA stump on 1/7 with placement of wound vac  Clinical Impression  The patient participated in transfers to [facility] and self propelled on the unit x 100' Patient  Reports that his residual limb is painful . Pt admitted with above diagnosis.  Pt currently with functional limitations due to the deficits listed below (see PT Problem List). Pt will benefit from skilled PT to increase their independence and safety with mobility to allow discharge to the venue listed below.       Follow Up Recommendations No PT follow up back to SNF, may benefit from a few PT visits at SNF, pt. Is not Mod I at this time.    Equipment Recommendations  None recommended by PT    Recommendations for Other Services       Precautions / Restrictions Precautions Precautions: Fall Precaution Comments: R AKA with wound      Mobility  Bed Mobility           Sit to supine: Supervision      Transfers Overall transfer level: Needs assistance   Transfers: Squat Pivot Transfers     Squat pivot transfers: Mod assist;+2 safety/equipment    Lateral/Scoot Transfers: Mod assist;+2 safety/equipment General transfer comment: From high recliner, squat pivot to [facility] with mod assist for safety( brake is broken on WC) , from [place] to bed  armrest removed for safer transfer. The patient did participate.  Ambulation/Gait                Cytogeneticist mobility: Yes Wheelchair propulsion: Both upper extremities;Left lower extremity Wheelchair parts: Supervision/cueing Distance: 100 Wheelchair Assistance Details (indicate cue type and reason): required sveral breaks to rest  Modified Rankin (Stroke Patients Only)       Balance   Sitting-balance support: No upper extremity supported;Feet unsupported Sitting balance-Leahy Scale: Good         Standing balance comment: NT                             Pertinent Vitals/Pain Pain Score: 8  Pain Location: R amputation site and phantom pain Pain Descriptors / Indicators: Sore;Grimacing;Throbbing Pain Intervention(s): Monitored during session;Premedicated before session    Home Living Family/patient expects to be discharged to:: Skilled nursing facility                 Additional Comments: [facility]    Prior Function Level of Independence: Needs assistance   Gait / Transfers Assistance Needed: mod I at wheelchair level  ADL's / Homemaking Assistance Needed: Reports can dress/bathe self- goes into the shower, facility provides meals        Hand Dominance        Extremity/Trunk Assessment   Upper Extremity Assessment Upper Extremity Assessment: Overall WFL for tasks assessed    Lower Extremity Assessment Lower Extremity Assessment: RLE deficits/detail;LLE deficits/detail RLE Deficits / Details: pt with R AKA, ,  very painful for pt. pt reports phantom pain LLE Deficits / Details: decreased support to fully stand       Communication      Cognition Arousal/Alertness: Awake/alert Behavior During Therapy: WFL for tasks assessed/performed Overall Cognitive Status: Within Functional Limits for tasks assessed                                        General Comments      Exercises     Assessment/Plan    PT Assessment Patient needs continued PT services  PT Problem List         PT  Treatment Interventions      PT Goals (Current goals can be found in the Care Plan section)       Frequency Min 2X/week   Barriers to discharge        Co-evaluation               AM-PAC PT "6 Clicks" Mobility  Outcome Measure Help needed turning from your back to your side while in a flat bed without using bedrails?: A Little Help needed moving from lying on your back to sitting on the side of a flat bed without using bedrails?: A Little Help needed moving to and from a bed to a chair (including a wheelchair)?: A Lot Help needed standing up from a chair using your arms (e.g., wheelchair or bedside chair)?: A Lot Help needed to walk in hospital room?: Total Help needed climbing 3-5 steps with a railing? : Total 6 Click Score: 12    End of Session   Activity Tolerance: Patient tolerated treatment well;Patient limited by pain;Patient limited by fatigue Patient left: in bed;with call bell/phone within reach;with bed alarm set Nurse Communication: Mobility status PT Visit Diagnosis: Difficulty in walking, not elsewhere classified (R26.2);Other abnormalities of gait and mobility (R26.89);Pain Pain - Right/Left: Right Pain - part of body: Hip;Leg    Time: 0913-0929 PT Time Calculation (min) (ACUTE ONLY): 16 min   Charges:   PT Evaluation $PT Eval Low Complexity: [place] PT Acute Rehabilitation Services Pager [phone] Office [phone]   Claretha Cooper 07/17/2019, 1:30 PM

## 2019-07-17 NOTE — Evaluation (Signed)
Occupational Therapy Evaluation Patient Details Name: Gregory [name] MRN: [id number] DOB: 1960-10-07 Today's Date: 07/17/2019    History of Present Illness 59 y.o. 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 Pt is now s/p revision of R AKA stump on 1/7 with placement of wound vac   Clinical Impression   PTA, pt was living at [facility] and performed BADLs and used w/c for mobility. Pt currently presenting with decreased strength, balance, and activity tolerance. Pt reporting he feels weaker and more fatigued. Pt requiring Mod A +2 for squat pivot to w/c demonstrating poor strength and balance. Pt would benefit from further acute OT to facilitate safe dc. Recommend dc to SNF for further OT to optimize safety, independence with ADLs, and return to PLOF.      Follow Up Recommendations  SNF;Supervision/Assistance - 24 hour    Equipment Recommendations  None recommended by OT    Recommendations for Other Services PT consult     Precautions / Restrictions Precautions Precautions: Fall Precaution Comments: R AKA with wound Restrictions Weight Bearing Restrictions: No RLE Weight Bearing: (Right AKA)      Mobility Bed Mobility Overal bed mobility: Needs Assistance         Sit to supine: Supervision   General bed mobility comments: Supervision for safety  Transfers Overall transfer level: Needs assistance   Transfers: Squat Pivot Transfers     Squat pivot transfers: Mod assist;+2 safety/equipment;+2 physical assistance    Lateral/Scoot Transfers: Mod assist;+2 safety/equipment;+2 physical assistance General transfer comment: From high recliner, squat pivot to [facility] with mod  assist for safety( brake is broken on WC) , from [place] to bed  armrest removed for safer transfer. The patient did participate.    Balance Overall balance assessment: Needs assistance Sitting-balance support: No upper extremity supported;Feet unsupported Sitting balance-Leahy Scale: Good Sitting balance - Comments: supervision for safety, pt able to move outside BOS with no LOB   Standing balance support: Bilateral upper extremity supported;During functional activity Standing balance-Leahy Scale: Poor Standing balance comment: Requiring Mod A for pivot to w/c                           ADL either performed or assessed with clinical judgement   ADL Overall ADL's : Needs assistance/impaired Eating/Feeding: Independent;Sitting;Bed level   Grooming: Set up;Wash/dry face;Sitting;Supervision/safety Grooming Details (indicate cue type and reason): Sitting at EOB, pt washing his face wiht supervision Upper Body Bathing: Bed level;Sitting;Min guard   Lower Body Bathing: Minimal assistance;Bed level;Sitting/lateral leans   Upper Body Dressing : Set up;Bed level;Sitting;Min guard   Lower Body Dressing: Minimal assistance;Bed level;Sitting/lateral leans   Toilet Transfer: Moderate assistance;+2 for physical assistance;Stand-pivot(simulated to w/c) Toilet Transfer Details (indicate cue type and reason): Pt requiring Mod A +2 to power up and pivot to w/c. Pt presenting wiht weakness and poor balance         Functional mobility during ADLs: Moderate assistance;+2 for physical assistance;Wheelchair;Min guard(Mod A +2 for stand pivot) General ADL Comments: Pt presenting with decreased strength and balance requiring Mod A for funcitonal transfers     Vision Patient Visual Report: No change from baseline       Perception     Praxis  Pertinent Vitals/Pain Pain Assessment: 0-10 Pain Score: 8  Pain Location: R amputation site and phantom pain Pain Descriptors / Indicators:  Sore;Grimacing;Throbbing Pain Intervention(s): Monitored during session;Limited activity within patient's tolerance;Repositioned;Premedicated before session     Hand Dominance Right   Extremity/Trunk Assessment Upper Extremity Assessment Upper Extremity Assessment: Generalized weakness   Lower Extremity Assessment Lower Extremity Assessment: Defer to PT evaluation RLE Deficits / Details: pt with R AKA, , very painful for pt. pt reports phantom pain LLE Deficits / Details: decreased support to fully stand   Cervical / Trunk Assessment Cervical / Trunk Assessment: Normal   Communication Communication Communication: HOH   Cognition Arousal/Alertness: Awake/alert Behavior During Therapy: WFL for tasks assessed/performed Overall Cognitive Status: Within Functional Limits for tasks assessed                                 General Comments: Requiring increased encouragement for participation   General Comments  VSS on RA    Exercises     Shoulder Instructions      Home Living Family/patient expects to be discharged to:: Skilled nursing facility Living Arrangements: Other (Comment)                               Additional Comments: [place]      Prior Functioning/Environment Level of Independence: Needs assistance  Gait / Transfers Assistance Needed: mod I at wheelchair level ADL's / Homemaking Assistance Needed: Reports can dress/bathe self- goes into the shower, facility provides meals            OT Problem List: Pain;Decreased activity tolerance      OT Treatment/Interventions: Self-care/ADL training;Therapeutic exercise;Energy conservation;DME and/or AE instruction;Therapeutic activities;Patient/family education    OT Goals(Current goals can be found in the care plan section) Acute Rehab OT Goals Patient Stated Goal: to return to facility OT Goal Formulation: With patient Time For Goal Achievement:  07/31/19 Potential to Achieve Goals: Good  OT Frequency: Min 2X/week   Barriers to D/C:            Co-evaluation PT/OT/SLP Co-Evaluation/Treatment: Yes Reason for Co-Treatment: Complexity of the patient's impairments (multi-system involvement);For patient/therapist safety;To address functional/ADL transfers   OT goals addressed during session: ADL's and self-care      AM-PAC OT "6 Clicks" Daily Activity     Outcome Measure Help from another person eating meals?: None Help from another person taking care of personal grooming?: None Help from another person toileting, which includes using toliet, bedpan, or urinal?: A Little Help from another person bathing (including washing, rinsing, drying)?: A Little Help from another person to put on and taking off regular upper body clothing?: None Help from another person to put on and taking off regular lower body clothing?: A Little 6 Click Score: 21   End of Session Equipment Utilized During Treatment: Other (comment)(w/vc) Nurse Communication: Mobility status  Activity Tolerance: Patient limited by pain Patient left: in bed;with call bell/phone within reach;with bed alarm set  OT Visit Diagnosis: Pain;Unsteadiness on feet (R26.81);Other abnormalities of gait and mobility (R26.89);Muscle weakness (generalized) (M62.81) Pain - Right/Left: Right Pain - part of body: Leg                Time: [id number] OT Time Calculation (min): 16 min Charges:  OT General Charges $OT Visit: 1 Visit OT Evaluation $OT Eval Moderate Complexity: 1 Mod  Burt Piatek  Braidon Chermak MSOT, OTR/L Acute Rehab Pager: [phone] Office: [place] 07/17/2019, 4:47 PM

## 2019-07-18 LAB — GLUCOSE, CAPILLARY
Glucose-Capillary: 131 mg/dL — ABNORMAL HIGH (ref 70–99)
Glucose-Capillary: 248 mg/dL — ABNORMAL HIGH (ref 70–99)
Glucose-Capillary: 129 mg/dL — ABNORMAL HIGH (ref 70–99)
Glucose-Capillary: 168 mg/dL — ABNORMAL HIGH (ref 70–99)

## 2019-07-18 MED ORDER — CEPHALEXIN 500 MG PO CAPS: 500.0000 mg | ORAL_CAPSULE | Freq: Four times a day (QID) | ORAL | 0 refills | Status: AC

## 2019-07-18 MED ORDER — OXYCODONE HCL 20 MG PO TABS: 20.0000 mg | ORAL_TABLET | ORAL | 0 refills | Status: AC | PRN

## 2019-07-18 MED ORDER — METOPROLOL TARTRATE 25 MG PO TABS: 12.5000 mg | ORAL_TABLET | Freq: Two times a day (BID) | ORAL | 0 refills | Status: DC

## 2019-07-18 MED ORDER — INSULIN ASPART 100 UNIT/ML ~~LOC~~ SOLN: 0.0000 [IU] | Freq: Three times a day (TID) | SUBCUTANEOUS | Status: DC

## 2019-07-18 MED ORDER — INSULIN ASPART 100 UNIT/ML ~~LOC~~ SOLN: 0.0000 [IU] | Freq: Every day | SUBCUTANEOUS | Status: DC

## 2019-07-18 MED ORDER — CLINDAMYCIN HCL 300 MG PO CAPS: 300.0000 mg | ORAL_CAPSULE | Freq: Four times a day (QID) | ORAL | 0 refills | Status: AC

## 2019-07-18 MED ORDER — ALPRAZOLAM 0.25 MG PO TABS: 0.2500 mg | ORAL_TABLET | Freq: Two times a day (BID) | ORAL | 0 refills | Status: AC | PRN

## 2019-07-18 MED ORDER — INSULIN ASPART 100 UNIT/ML ~~LOC~~ SOLN: 0.0000 [IU] | Freq: Three times a day (TID) | SUBCUTANEOUS | 0 refills | Status: DC

## 2019-07-18 MED ORDER — INSULIN ASPART 100 UNIT/ML ~~LOC~~ SOLN
0.0000 [IU] | SUBCUTANEOUS | Status: DC
  Administered 2019-07-18: 12:00:00 4 [IU] via SUBCUTANEOUS

## 2019-07-18 NOTE — Plan of Care (Signed)
  Problem: Education: Goal: Knowledge of risk factors and measures for prevention of condition will improve Outcome: Adequate for Discharge   Problem: Coping: Goal: Psychosocial and spiritual needs will be supported Outcome: Adequate for Discharge   Problem: Respiratory: Goal: Will maintain a patent airway Outcome: Adequate for Discharge Goal: Complications related to the disease process, condition or treatment will be avoided or minimized Outcome: Adequate for Discharge   Problem: Education: Patient discharged to facility via [facility]. Discharge teaching completed with patient and included new medication regimen, and covid 19 information. Patient verbalizes understanding and asks appropriate questions. PIV removed with catheter intact.   Goal: Knowledge of General Education information will improve Description: Including pain rating scale, medication(s)/side effects and non-pharmacologic comfort measures Outcome: Adequate for Discharge   Problem: Health Behavior/Discharge Planning: Goal: Ability to manage health-related needs will improve Outcome: Adequate for Discharge   Problem: Clinical Measurements: Goal: Ability to maintain clinical measurements within normal limits will improve Outcome: Adequate for Discharge Goal: Will remain free from infection Outcome: Adequate for Discharge Goal: Diagnostic test results will improve Outcome: Adequate for Discharge Goal: Respiratory complications will improve Outcome: Adequate for Discharge Goal: Cardiovascular complication will be avoided Outcome: Adequate for Discharge

## 2019-07-18 NOTE — TOC Transition Note (Signed)
Transition of Care [facility]) - CM/SW Discharge Note   Patient Details  Name: Gregory [name] MRN: [id number] Date of Birth: 1960-10-29  [facility] CM/SW Contact:  Ninfa Meeker, RN Phone Number: [phone] (working remotely) 07/18/2019, 12:11 PM   Clinical Narrative:    Patient will DC to: [facility] Anticipated DC date: 07/18/19 Family notified: Daughter : Joellen [place] (left message for return call) Transport by: [facility] 1pm  Patient is ready for discharge back to [facility] per MD. Case Manager spoke with patient via telephone to update him on discharge plan.Facility and nurse are aware. Case manager sent DC summary to facility. Bedside RN to call report to: [phone], he will go to room 115B. Ambulance transport has been requested.   Final next level of care: Skilled Nursing Facility Barriers to Discharge: No Barriers Identified   Patient Goals and CMS Choice        Discharge Placement                       Discharge Plan and Services                                     Social Determinants of Health (SDOH) Interventions     Readmission Risk Interventions Readmission Risk Prevention Plan 06/23/2019 12/07/2018 11/20/2018  Transportation Screening Complete Complete Complete  PCP or Specialist Appt within 3-5 Days Not Complete - -  Not Complete comments SNF pt - -  HRI or Home Care Consult Complete - -  Social Work Consult for [place] Planning/Counseling Complete - -  Palliative Care Screening Not Applicable - -  Medication Review ([place]) Referral to Pharmacy Complete Complete  PCP or Specialist appointment within 3-5 days of discharge Not Complete Complete Complete  PCP/Specialist Appt Not Complete comments SNF pt - -  HRI or Home Care Consult Complete Not Complete Not Complete  HRI or Home Care Consult Pt Refusal Comments - pt from SNF LTC pt is LTC SNF resident  SW Recovery Care/Counseling Consult Complete Complete Complete   Palliative Care Screening Not Applicable Not Applicable Not Applicable  Skilled Nursing Facility Complete Complete Complete  Some recent data might be hidden

## 2019-07-18 NOTE — Progress Notes (Signed)
Attempted to call report to facility. No answer.  

## 2019-07-18 NOTE — Plan of Care (Signed)
Patient discharged to home via wheelchair. Discharge teaching completed and included: home med regimen, covid 19 quarantine info, follow-up appointment info, and signs & symptoms to report/return with. PIV removed with catheter intact.    Problem: Education: Goal: Knowledge of risk factors and measures for prevention of condition will improve 07/18/2019 1341 by Dawayne Cirri, RN Outcome: Adequate for Discharge 07/18/2019 1327 by Dawayne Cirri, RN Outcome: Adequate for Discharge   Problem: Coping: Goal: Psychosocial and spiritual needs will be supported 07/18/2019 1341 by Dawayne Cirri, RN Outcome: Adequate for Discharge 07/18/2019 1327 by Dawayne Cirri, RN Outcome: Adequate for Discharge   Problem: Respiratory: Goal: Will maintain a patent airway 07/18/2019 1341 by Dawayne Cirri, RN Outcome: Adequate for Discharge 07/18/2019 1327 by Dawayne Cirri, RN Outcome: Adequate for Discharge Goal: Complications related to the disease process, condition or treatment will be avoided or minimized 07/18/2019 1341 by Dawayne Cirri, RN Outcome: Adequate for Discharge 07/18/2019 1327 by Dawayne Cirri, RN Outcome: Adequate for Discharge   Problem: Education: Goal: Knowledge of General Education information will improve Description: Including pain rating scale, medication(s)/side effects and non-pharmacologic comfort measures 07/18/2019 1341 by Dawayne Cirri, RN Outcome: Adequate for Discharge 07/18/2019 1327 by Dawayne Cirri, RN Outcome: Adequate for Discharge   Problem: Health Behavior/Discharge Planning: Goal: Ability to manage health-related needs will improve 07/18/2019 1341 by Dawayne Cirri, RN Outcome: Adequate for Discharge 07/18/2019 1327 by Dawayne Cirri, RN Outcome: Adequate for Discharge   Problem: Clinical Measurements: Goal: Ability to maintain clinical measurements within normal limits will improve 07/18/2019 1341 by Dawayne Cirri, RN Outcome:  Adequate for Discharge 07/18/2019 1327 by Dawayne Cirri, RN Outcome: Adequate for Discharge Goal: Will remain free from infection 07/18/2019 1341 by Dawayne Cirri, RN Outcome: Adequate for Discharge 07/18/2019 1327 by Dawayne Cirri, RN Outcome: Adequate for Discharge Goal: Diagnostic test results will improve 07/18/2019 1341 by Dawayne Cirri, RN Outcome: Adequate for Discharge 07/18/2019 1327 by Dawayne Cirri, RN Outcome: Adequate for Discharge Goal: Respiratory complications will improve 07/18/2019 1341 by Dawayne Cirri, RN Outcome: Adequate for Discharge 07/18/2019 1327 by Dawayne Cirri, RN Outcome: Adequate for Discharge Goal: Cardiovascular complication will be avoided 07/18/2019 1341 by Dawayne Cirri, RN Outcome: Adequate for Discharge 07/18/2019 1327 by Dawayne Cirri, RN Outcome: Adequate for Discharge

## 2019-07-18 NOTE — Discharge Instructions (Signed)
COVID-19 COVID-19 is a respiratory infection that is caused by a virus called severe acute respiratory syndrome coronavirus 2 (SARS-CoV-2). The disease is also known as coronavirus disease or novel coronavirus. In some people, the virus may not cause any symptoms. In others, it may cause a serious infection. The infection can get worse quickly and can lead to complications, such as:  Pneumonia, or infection of the lungs.  Acute respiratory distress syndrome or ARDS. This is a condition in which fluid build-up in the lungs prevents the lungs from filling with air and passing oxygen into the blood.  Acute respiratory failure. This is a condition in which there is not enough oxygen passing from the lungs to the body or when carbon dioxide is not passing from the lungs out of the body.  Sepsis or septic shock. This is a serious bodily reaction to an infection.  Blood clotting problems.  Secondary infections due to bacteria or fungus.  Organ failure. This is when your body's organs stop working. The virus that causes COVID-19 is contagious. This means that it can spread from person to person through droplets from coughs and sneezes (respiratory secretions). What are the causes? This illness is caused by a virus. You may catch the virus by:  Breathing in droplets from an infected person. Droplets can be spread by a person breathing, speaking, singing, coughing, or sneezing.  Touching something, like a table or a doorknob, that was exposed to the virus (contaminated) and then touching your mouth, nose, or eyes. What increases the risk? Risk for infection You are more likely to be infected with this virus if you:  Are within 6 feet (2 meters) of a person with COVID-19.  Provide care for or live with a person who is infected with COVID-19.  Spend time in crowded indoor spaces or live in shared housing. Risk for serious illness You are more likely to become seriously ill from the virus if you:   Are 50 years of age or older. The higher your age, the more you are at risk for serious illness.  Live in a nursing home or long-term care facility.  Have cancer.  Have a long-term (chronic) disease such as: ? Chronic lung disease, including chronic obstructive pulmonary disease or asthma. ? A long-term disease that lowers your body's ability to fight infection (immunocompromised). ? Heart disease, including heart failure, a condition in which the arteries that lead to the heart become narrow or blocked (coronary artery disease), a disease which makes the heart muscle thick, weak, or stiff (cardiomyopathy). ? Diabetes. ? Chronic kidney disease. ? Sickle cell disease, a condition in which red blood cells have an abnormal "sickle" shape. ? Liver disease.  Are obese. What are the signs or symptoms? Symptoms of this condition can range from mild to severe. Symptoms may appear any time from 2 to 14 days after being exposed to the virus. They include:  A fever or chills.  A cough.  Difficulty breathing.  Headaches, body aches, or muscle aches.  Runny or stuffy (congested) nose.  A sore throat.  New loss of taste or smell. Some people may also have stomach problems, such as nausea, vomiting, or diarrhea. Other people may not have any symptoms of COVID-19. How is this diagnosed? This condition may be diagnosed based on:  Your signs and symptoms, especially if: ? You live in an area with a COVID-19 outbreak. ? You recently traveled to or from an area where the virus is common. ? You   provide care for or live with a person who was diagnosed with COVID-19. ? You were exposed to a person who was diagnosed with COVID-19.  A physical exam.  Lab tests, which may include: ? Taking a sample of fluid from the back of your nose and throat (nasopharyngeal fluid), your nose, or your throat using a swab. ? A sample of mucus from your lungs (sputum). ? Blood tests.  Imaging tests, which  may include, X-rays, CT scan, or ultrasound. How is this treated? At present, there is no medicine to treat COVID-19. Medicines that treat other diseases are being used on a trial basis to see if they are effective against COVID-19. Your health care provider will talk with you about ways to treat your symptoms. For most people, the infection is mild and can be managed at home with rest, fluids, and over-the-counter medicines. Treatment for a serious infection usually takes places in a hospital intensive care unit (ICU). It may include one or more of the following treatments. These treatments are given until your symptoms improve.  Receiving fluids and medicines through an IV.  Supplemental oxygen. Extra oxygen is given through a tube in the nose, a face mask, or a hood.  Positioning you to lie on your stomach (prone position). This makes it easier for oxygen to get into the lungs.  Continuous positive airway pressure (CPAP) or bi-level positive airway pressure (BPAP) machine. This treatment uses mild air pressure to keep the airways open. A tube that is connected to a motor delivers oxygen to the body.  Ventilator. This treatment moves air into and out of the lungs by using a tube that is placed in your windpipe.  Tracheostomy. This is a procedure to create a hole in the neck so that a breathing tube can be inserted.  Extracorporeal membrane oxygenation (ECMO). This procedure gives the lungs a chance to recover by taking over the functions of the heart and lungs. It supplies oxygen to the body and removes carbon dioxide. Follow these instructions at home: Lifestyle  If you are sick, stay home except to get medical care. Your health care provider will tell you how long to stay home. Call your health care provider before you go for medical care.  Rest at home as told by your health care provider.  Do not use any products that contain nicotine or tobacco, such as cigarettes, e-cigarettes, and  chewing tobacco. If you need help quitting, ask your health care provider.  Return to your normal activities as told by your health care provider. Ask your health care provider what activities are safe for you. General instructions  Take over-the-counter and prescription medicines only as told by your health care provider.  Drink enough fluid to keep your urine pale yellow.  Keep all follow-up visits as told by your health care provider. This is important. How is this prevented?  There is no vaccine to help prevent COVID-19 infection. However, there are steps you can take to protect yourself and others from this virus. To protect yourself:   Do not travel to areas where COVID-19 is a risk. The areas where COVID-19 is reported change often. To identify high-risk areas and travel restrictions, check the CDC travel website: wwwnc.cdc.gov/travel/notices  If you live in, or must travel to, an area where COVID-19 is a risk, take precautions to avoid infection. ? Stay away from people who are sick. ? Wash your hands often with soap and water for 20 seconds. If soap and water   are not available, use an alcohol-based hand sanitizer. ? Avoid touching your mouth, face, eyes, or nose. ? Avoid going out in public, follow guidance from your state and local health authorities. ? If you must go out in public, wear a cloth face covering or face mask. Make sure your mask covers your nose and mouth. ? Avoid crowded indoor spaces. Stay at least 6 feet (2 meters) away from others. ? Disinfect objects and surfaces that are frequently touched every day. This may include:  Counters and tables.  Doorknobs and light switches.  Sinks and faucets.  Electronics, such as phones, remote controls, keyboards, computers, and tablets. To protect others: If you have symptoms of COVID-19, take steps to prevent the virus from spreading to others.  If you think you have a COVID-19 infection, contact your health care  provider right away. Tell your health care team that you think you may have a COVID-19 infection.  Stay home. Leave your house only to seek medical care. Do not use public transport.  Do not travel while you are sick.  Wash your hands often with soap and water for 20 seconds. If soap and water are not available, use alcohol-based hand sanitizer.  Stay away from other members of your household. Let healthy household members care for children and pets, if possible. If you have to care for children or pets, wash your hands often and wear a mask. If possible, stay in your own room, separate from others. Use a different bathroom.  Make sure that all people in your household wash their hands well and often.  Cough or sneeze into a tissue or your sleeve or elbow. Do not cough or sneeze into your hand or into the air.  Wear a cloth face covering or face mask. Make sure your mask covers your nose and mouth. Where to find more information  Centers for Disease Control and Prevention: www.cdc.gov/coronavirus/2019-ncov/index.html  World Health Organization: www.who.int/health-topics/coronavirus Contact a health care provider if:  You live in or have traveled to an area where COVID-19 is a risk and you have symptoms of the infection.  You have had contact with someone who has COVID-19 and you have symptoms of the infection. Get help right away if:  You have trouble breathing.  You have pain or pressure in your chest.  You have confusion.  You have bluish lips and fingernails.  You have difficulty waking from sleep.  You have symptoms that get worse. These symptoms may represent a serious problem that is an emergency. Do not wait to see if the symptoms will go away. Get medical help right away. Call your local emergency services (911 in the [place] Do not drive yourself to the hospital. Let the emergency medical personnel know if you think you have COVID-19. Summary  COVID-19 is a  respiratory infection that is caused by a virus. It is also known as coronavirus disease or novel coronavirus. It can cause serious infections, such as pneumonia, acute respiratory distress syndrome, acute respiratory failure, or sepsis.  The virus that causes COVID-19 is contagious. This means that it can spread from person to person through droplets from breathing, speaking, singing, coughing, or sneezing.  You are more likely to develop a serious illness if you are 50 years of age or older, have a weak immune system, live in a nursing home, or have chronic disease.  There is no medicine to treat COVID-19. Your health care provider will talk with you about ways to treat your symptoms.    Take steps to protect yourself and others from infection. Wash your hands often and disinfect objects and surfaces that are frequently touched every day. Stay away from people who are sick and wear a mask if you are sick. This information is not intended to replace advice given to you by your health care provider. Make sure you discuss any questions you have with your health care provider. Document Revised: 04/03/2019 Document Reviewed: 07/10/2018 [employer]vier Patient Education  2020 [employer]  

## 2019-07-18 NOTE — Progress Notes (Signed)
Writer went with lab to discuss importance of need for labs. Patient refused labs stating he is not going to do it because his legs are hurting and he is not going to have labs drawn.

## 2019-07-18 NOTE — Discharge Summary (Addendum)
Physician Discharge Summary  Gregory [name] [name] DOB: [birth year] DOA: 07/11/2019  PCP: [name], MD  Admit date: 07/11/2019 Discharge date: 07/18/2019  Time spent: 40 minutes  Recommendations for Outpatient Follow-up:  1. Follow outpatient CBC/CMP 2. Will need to quarantine until 2/13 per CDC guidelines 3. Given drainage from R stump, will extend abx x1 week, needs f/u with [name] outpatient 4. I've written 2 days of oxycodone and xanax for him to facilitate return to SNF, further prescriptions will need to be refilled by SNF MD.  Pt on large doses of pain medications here, also on sedating meds with benzos and lunesta at night.  Needs long term plan for these meds, taper vs adjustment in dose vs referral to pain specialist. 5. Resuming home insulin regimen, added sliding scale insulin, see below for sliding scale - follow BG outpatient and adjust regimen  6. Metoprolol decreased due to HR, follow and adjust as needed 7. Holding lasix on discharge, follow and resume as needed based on volume status and renal function 8. Follow iron def outpatient   Discharge Diagnoses:  Principal Problem:   Acute respiratory failure with hypoxia ([place]) Active Problems:   COPD (chronic obstructive pulmonary disease) (HCC)   Pulmonary hypertension (HCC)   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HCC)   Above knee amputation of right lower extremity (HCC)   Diabetic polyneuropathy associated with type 2 diabetes mellitus (HCC)   CKD (chronic kidney disease), stage III   Acute respiratory disease due to COVID-19 virus   Discharge Condition: stable  Diet recommendation: diabetic  Filed Weights   07/11/19 2211  Weight: 112 kg    History of present illness:  59 year old male with DM 2, status post right lower extremity amputation with multiple revisions most recent 18 June 2018, now AKA, HTN, COPD, chronic pain, hyperlipidemia, GERD, hospitalized several times since December all  related to chronic osteomyelitis and chronic wound infection, currently at [facility], there have been apparently multiple cases of Covid over there and came into the hospital with progressive shortness of breath over the last 5 days that has been getting worse. On admission required 15 L nasal cannula, currently on 3 L.  He was admitted for COVID pneumonia and has improved on steroids, remdesivir, plasma, and actemra.  He's currently doing well on RA.  Plan for discharge back to SNF on 1/30.   Hospital Course:  Acute Hypoxic Respiratory Failure due to Covid-19 Viral Illness/COVID-19 pneumonia -Remdesivir, completed 1/27 -steroids (7 days) -Supportive treatment with inhalers, antitussives, incentive spirometer, flutter valve -Status post convalescent plasma on 1/23 -Actemra 1/26 -I/O, daily weights -LE [place] without DVT -Doing well on 1/30 on RA, plan for d/c to SNF  Underlying COPD -No wheezing, currently on steroids, continue inhalers  DM 2, with hyperglycemia -Resume home regimen on discharge -add SSI - adjust insulin as needed -sliding scale as noted below  For blood sugar <70, implement hypoglycemia protocol For blood sugar 70-120, give 0 units aspart For blood sugar 121-150, give 1 unit aspart For blood sugar 151-200, give 2 units aspart For blood sugar 201-250, give 3 units aspart For blood sugar 251-300, give 5 units aspart For blood sugar 301-350, give 7 units aspart For blood sugar 351-400, give 9 units aspart For blood sugar >400, call MD  Essential hypertension -Continue Norvasc, metoprolol (dose reduced - follow outpatient) - lasix on hold, follow outpatient for need for resumption  Chronic kidney disease stage IIIb -Baseline creatinine 1.3-2.1,creatinine improved today 1.59 -  lasix currently on hold, follow outpatient -he's refused labs last several days, follow outpatient  Anemia of Chronic Kidney Disease  Iron Deficiency -Of chronic  kidney disease, no bleeding, continue to monitor -follow iron, b12, folate, ferritin - low iron and % sat, component of iron def anemia as well - nl b12 and folate  GERD -Continue PPI  Chronic osteomyelitis/lower extremity wounds -Status post revision by [name] at the beginning of January 2020 -of note, I don't see any imaging finding in chart specific for osteo, though this was noted on chart when I picked him up -Currently on Keflex and clindamycin based on prior microbiology with E. coli as well as MRSA, was discharged on 1/9 with plans for 21 days of antibiotics -Scheduled to finish antibiotics on 1/30 -Sutures should remain in place "at least 4 weeks" - would discuss with [name] around this time -he has drainage from the RLE stump, cloudy.  Discussed with [name] 1/28, will consider extending abx duration after completion.  Of note, per our discussion, pt sometimes manipulates wound for unclear reasons, so risk of infection is high, will continue to monitor on abx.  Will send home with additional 7 days abx.  Needs f/u with [name] outpatient.  BPH -Continue tamsulosin, oxybutynin  Depression -Continue paroxetine  Bradycardia - Decrease metop dose, add holding parameters  Currently refusing labs, will continue to discuss  Procedures: LE [place] Summary:  Right: There is no evidence of deep vein thrombosis in the lower  extremity.  Left: There is no evidence of deep vein thrombosis in the lower extremity.  However, portions of this examination were limited- see technologist  comments above. No cystic structure found in the popliteal fossa.   Consultations:  none  Discharge Exam: Vitals:   07/18/19 0331 07/18/19 0802  BP: (!) 153/73 (!) 159/83  Pulse: (!) 56 94  Resp:  18  Temp: 98.4 F (36.9 C) 97.8 F (36.6 C)  SpO2: 98% 92%   C/o RLE pain Otherwise, breathing feels fine No answer from daughter over phone - called and discussed with her later in  day  General: No acute distress. Cardiovascular: Heart sounds show Gregory [name] regular rate, and rhythm.  Lungs: Clear to auscultation bilaterally Abdomen: Soft, nontender, nondistended  Neurological: Alert and oriented 3. Moves all extremities 4. Cranial nerves II through XII grossly intact. Skin: Warm and dry. No rashes or lesions. Extremities: RLE stump with some drainage from incision site   Discharge Instructions   Discharge Instructions    Diet - low sodium heart healthy   Complete by: As directed    Discharge instructions   Complete by: As directed    You were seen for COVID 19 infection.    You've improved with steroids and remdesivir and actemra.  You should continue to quarantine until February 13th.  Please follow up with [name] as an outpatient for your recent surgery.  You'll need Gregory [name] follow up appointment for your sutures.  You have continued drainage from that wound, please continue to follow with [name].  We've extended your antibiotic course for another week.  Follow your blood sugars as an outpatient.  We'll resume your mealtime insulin, but also add Kayvon Mo prescription for sliding scale insulin.  You need to continue to follow your blood sugars with your outpatient PCP, they'll need to adjust your medications as an outpatient.    Your sliding scale will be as follows. For blood sugar <70, implement hypoglycemia protocol For blood sugar  70-120, give 0 units aspart For blood sugar 121-150, give 1 unit aspart For blood sugar 151-200, give 2 units aspart For blood sugar 201-250, give 3 units aspart For blood sugar 251-300, give 5 units aspart For blood sugar 301-350, give 7 units aspart For blood sugar 351-400, give 9 units aspart For blood sugar >400, call MD  We've adjusted your blood pressure medicines.  Your metoprolol has been reduced and we're holding your lasix.  Please follow with your PCP to see if these need to be resumed.    Your on many pain medicines and  sedating medications.  Long term, these should be tapered and adjusted.  Please follow up with this with your PCP.  Return for new, recurrent, or worsening symptoms.  Please ask your PCP to request records from this hospitalization so they know what was done and what the next steps will be.   Increase activity slowly   Complete by: As directed      Allergies as of 07/18/2019      Reactions   Latex Other (See Comments)   Listed on MAR- pt sts he is not allergic.      Medication List    STOP taking these medications   furosemide 40 MG tablet Commonly known as: LASIX     TAKE these medications   acetaminophen 325 MG tablet Commonly known as: TYLENOL Take 650 mg by mouth every 4 (four) hours as needed (general discomfort).   albuterol 108 (90 Base) MCG/ACT inhaler Commonly known as: VENTOLIN HFA Inhale 2 puffs into the lungs every 6 (six) hours as needed for wheezing or shortness of breath.   ALPRAZolam 0.25 MG tablet Commonly known as: XANAX Take 1 tablet (0.25 mg total) by mouth 2 (two) times daily as needed for up to 2 days for anxiety.   amLODipine 10 MG tablet Commonly known as: NORVASC Take 10 mg by mouth daily. for HTN   Baclofen 5 MG Tabs Take 5 mg by mouth 3 (three) times daily.   buPROPion 300 MG 24 hr tablet Commonly known as: WELLBUTRIN XL Take 300 mg by mouth at bedtime.   cephALEXin 500 MG capsule Commonly known as: KEFLEX Take 1 capsule (500 mg total) by mouth 4 (four) times daily for 7 days. What changed: when to take this   cetirizine 10 MG tablet Commonly known as: ZYRTEC Take 10 mg by mouth daily.   clindamycin 300 MG capsule Commonly known as: CLEOCIN Take 1 capsule (300 mg total) by mouth 4 (four) times daily for 7 days. What changed: when to take this   docusate sodium 100 MG capsule Commonly known as: COLACE Take 1 capsule (100 mg total) by mouth 2 (two) times daily.   eszopiclone 1 MG Tabs tablet Commonly known as: LUNESTA Take 1 mg  by mouth at bedtime. Take immediately before bedtime   ferrous sulfate 325 (65 FE) MG tablet Take 1 tablet (325 mg total) by mouth daily with breakfast.   finasteride 5 MG tablet Commonly known as: PROSCAR Take 5 mg by mouth daily.   glipiZIDE 10 MG tablet Commonly known as: GLUCOTROL Take 10 mg by mouth daily before breakfast.   HumaLOG KwikPen 100 UNIT/ML KwikPen Generic drug: insulin lispro Inject 16 Units into the skin 3 (three) times daily before meals.   ibuprofen 800 MG tablet Commonly known as: ADVIL Take 800 mg by mouth 3 (three) times daily.   insulin aspart 100 UNIT/ML injection Commonly known as: novoLOG Inject 0-9 Units into  the skin 3 (three) times daily with meals.   lidocaine 5 % Commonly known as: LIDODERM Place 1 patch onto the skin daily. Apply to Right stump. Remove & Discard patch within 12 hours or as directed by MD   Lyrica 300 MG capsule Generic drug: pregabalin Take 300 mg by mouth 2 (two) times daily.   magnesium oxide 400 MG tablet Commonly known as: MAG-OX Take 400 mg by mouth 2 (two) times daily.   metoprolol tartrate 25 MG tablet Commonly known as: LOPRESSOR Take 0.5 tablets (12.5 mg total) by mouth 2 (two) times daily. What changed:   medication strength  how much to take  when to take this   mirabegron ER 50 MG Tb24 tablet Commonly known as: MYRBETRIQ Take 50 mg by mouth daily.   Nasonex 50 MCG/ACT nasal spray Generic drug: mometasone Place 2 sprays into the nose at bedtime.   nicotine 14 mg/24hr patch Commonly known as: NICODERM CQ - dosed in mg/24 hours Place 1 patch (14 mg total) onto the skin at bedtime.   omeprazole 20 MG capsule Commonly known as: PRILOSEC Take 40 mg by mouth daily before breakfast.   oxybutynin 10 MG 24 hr tablet Commonly known as: DITROPAN-XL Take 10 mg by mouth at bedtime.   Oxycodone HCl 20 MG Tabs Take 1 tablet (20 mg total) by mouth every 4 (four) hours as needed for up to 2 days  (pain). What changed:   reasons to take this  Another medication with the same name was removed. Continue taking this medication, and follow the directions you see here.   PARoxetine 10 MG tablet Commonly known as: PAXIL Take 10 mg by mouth at bedtime.   rosuvastatin 10 MG tablet Commonly known as: CRESTOR Take 10 mg by mouth at bedtime.   senna-docusate 8.6-50 MG tablet Commonly known as: Senokot-S Take 2 tablets by mouth at bedtime.   Spiriva Respimat 1.25 MCG/ACT Aers Generic drug: Tiotropium Bromide Monohydrate Inhale 2 puffs into the lungs daily.   tamsulosin 0.4 MG Caps capsule Commonly known as: FLOMAX Take 0.4 mg by mouth every evening.   terbinafine 250 MG tablet Commonly known as: LAMISIL Take 250 mg by mouth daily.   vitamin B-12 500 MCG tablet Commonly known as: CYANOCOBALAMIN Take 1,000 mcg by mouth daily.   vitamin C 250 MG tablet Commonly known as: ASCORBIC ACID Take 500 mg by mouth 2 (two) times daily.   Vitamin D-3 125 MCG (5000 UT) Tabs Take 5,000 Units by mouth daily.   Vitamin D-3 125 MCG (5000 UT) Tabs Take 1 tablet by mouth daily.   Vitamin D 50 MCG (2000 UT) tablet Take 2,000 Units by mouth daily.   Zinc-220 220 (50 Zn) MG capsule Generic drug: zinc sulfate Take 220 mg by mouth daily.      Allergies  Allergen Reactions  . Latex Other (See Comments)    Listed on MAR- pt sts he is not allergic.      The results of significant diagnostics from this hospitalization (including imaging, microbiology, ancillary and laboratory) are listed below for reference.    Significant Diagnostic Studies: DG Chest Port 1 View  Result Date: 07/11/2019 CLINICAL DATA:  COVID positive, shortness of breath. Hypoxemia. EXAM: PORTABLE CHEST 1 VIEW COMPARISON:  Chest x-ray dated 05/22/2019. FINDINGS: Ill-defined airspace opacities bilaterally, RIGHT greater than LEFT. No pleural effusion or pneumothorax is seen. Borderline cardiomegaly is stable. Osseous  structures about the chest are unremarkable. IMPRESSION: Multifocal pneumonia, RIGHT greater than LEFT. Electronically  Signed   By: Franki Cabot M.D.   On: 07/11/2019 10:50   VAS [place] LOWER EXTREMITY VENOUS (DVT)  Result Date: 07/14/2019  Lower Venous Study Indications: Elevated D dimer.  Limitations: Body habitus. Performing Technologist: Gregory [name] RDMS, RVT  Examination Guidelines: Laverle Pillard complete evaluation includes B-mode imaging, spectral Doppler, color Doppler, and power Doppler as needed of all accessible portions of each vessel. Bilateral testing is considered an integral part of Gregory [name] complete examination. Limited examinations for reoccurring indications may be performed as noted.  +-------+---------------+---------+-----------+----------+--------------+ RIGHT  CompressibilityPhasicitySpontaneityPropertiesThrombus Aging +-------+---------------+---------+-----------+----------+--------------+ CFV    Full                                                        +-------+---------------+---------+-----------+----------+--------------+ SFJ    Full                                                        +-------+---------------+---------+-----------+----------+--------------+ FV ProxFull                                                        +-------+---------------+---------+-----------+----------+--------------+ enlarged lymph node 1.8 x 2.8cm  +---------+---------------+---------+-----------+----------+-------------------+ LEFT     CompressibilityPhasicitySpontaneityPropertiesThrombus Aging      +---------+---------------+---------+-----------+----------+-------------------+ CFV      Full                                                             +---------+---------------+---------+-----------+----------+-------------------+ SFJ      Full                                                              +---------+---------------+---------+-----------+----------+-------------------+ FV Prox  Full                                                             +---------+---------------+---------+-----------+----------+-------------------+ FV Mid   Full                                                             +---------+---------------+---------+-----------+----------+-------------------+ FV DistalFull                                                             +---------+---------------+---------+-----------+----------+-------------------+  PFV      Full                                                             +---------+---------------+---------+-----------+----------+-------------------+ POP      Full                                                             +---------+---------------+---------+-----------+----------+-------------------+ PTV                                                   unable to compress,                                                       poor visualization  +---------+---------------+---------+-----------+----------+-------------------+ PERO                                                  unable to compress,                                                       poor visualization  +---------+---------------+---------+-----------+----------+-------------------+ GSV      Full                                                             +---------+---------------+---------+-----------+----------+-------------------+     Summary: Right: There is no evidence of deep vein thrombosis in the lower extremity. Left: There is no evidence of deep vein thrombosis in the lower extremity. However, portions of this examination were limited- see technologist comments above. No cystic structure found in the popliteal fossa.  *See table(s) above for measurements and observations. Electronically signed by Deitra Mayo MD on  07/14/2019 at 4:07:35 PM.    Final     Microbiology: Recent Results (from the past 240 hour(s))  Blood Culture (routine x 2)     Status: None   Collection Time: 07/11/19 10:22 AM   Specimen: BLOOD  Result Value Ref Range Status   Specimen Description BLOOD RIGHT ANTECUBITAL  Final   Special Requests   Final    BOTTLES DRAWN AEROBIC AND ANAEROBIC Blood Culture adequate volume   Culture   Final    NO GROWTH 5 DAYS Performed at [facility], [address] [place], [place] [address]    Report  Status 07/16/2019 FINAL  Final  Blood Culture (routine x 2)     Status: None   Collection Time: 07/11/19 11:10 AM   Specimen: BLOOD RIGHT HAND  Result Value Ref Range Status   Specimen Description BLOOD RIGHT HAND  Final   Special Requests   Final    BOTTLES DRAWN AEROBIC AND ANAEROBIC Blood Culture results may not be optimal due to an inadequate volume of blood received in culture bottles   Culture   Final    NO GROWTH 5 DAYS Performed at [facility], [place] [address], [place], [place] [address]    Report Status 07/16/2019 FINAL  Final     Labs: Basic Metabolic Panel: Recent Labs  Lab 07/12/19 0920 07/13/19 0300 07/14/19 0157 07/15/19 0248  NA 138 139 140 140  K 4.8 4.6 5.3* 4.8  CL 103 102 102 103  CO2 25 26 26 27   GLUCOSE 216* 77 167* 152*  BUN 38* 41* 41* 40*  CREATININE 1.81* 1.98* 1.79* 1.59*  CALCIUM 8.2* 8.7* 8.5* 8.9  MG 2.1 2.3 2.3 2.5*   Liver Function Tests: Recent Labs  Lab 07/12/19 0920 07/13/19 0300 07/14/19 0157 07/15/19 0248  AST 16 22 17  14*  ALT 11 12 10 10   ALKPHOS 71 86 72 72  BILITOT 0.2* 0.4 0.8 0.5  PROT 6.9 8.0 7.3 7.1  ALBUMIN 2.6* 3.0* 2.7* 2.6*   No results for input(s): LIPASE, AMYLASE in the last 168 hours. No results for input(s): AMMONIA in the last 168 hours. CBC: Recent Labs  Lab 07/12/19 0920 07/12/19 1300 07/13/19 0300 07/14/19 0157 07/15/19 0248  WBC 5.3  --  8.5 6.5 4.4  NEUTROABS 4.4  --  6.7 5.6 3.3  HGB  6.6* 6.9* 8.6* 7.9* 8.1*  HCT 22.7* 22.9* 29.8* 27.5* 27.5*  MCV 84.4  --  86.1 86.8 83.8  PLT 205  --  244 247 251   Cardiac Enzymes: No results for input(s): CKTOTAL, CKMB, CKMBINDEX, TROPONINI in the last 168 hours. BNP: BNP (last 3 results) Recent Labs    07/11/19 1023  BNP 74.8    ProBNP (last 3 results) No results for input(s): PROBNP in the last 8760 hours.  CBG: Recent Labs  Lab 07/17/19 0734 07/17/19 1117 07/17/19 1603 07/17/19 2347 07/18/19 0313  GLUCAP 131* 188* 148* 282* 248*       Signed:  Fayrene Helper MD.  [facility] 07/18/2019, 10:49 AM

## 2019-07-20 LAB — GLUCOSE, CAPILLARY: Glucose-Capillary: 150 mg/dL — ABNORMAL HIGH (ref 70–99)

## 2019-08-03 ENCOUNTER — Ambulatory Visit (INDEPENDENT_AMBULATORY_CARE_PROVIDER_SITE_OTHER): Payer: Medicaid Other | Admitting: Physician Assistant

## 2019-08-03 ENCOUNTER — Other Ambulatory Visit: Payer: Self-pay

## 2019-08-03 ENCOUNTER — Encounter: Payer: Self-pay | Admitting: Physician Assistant

## 2019-08-03 VITALS — Ht 72.0 in | Wt 246.0 lb

## 2019-08-03 DIAGNOSIS — M86172 Other acute osteomyelitis, left ankle and foot: Secondary | ICD-10-CM

## 2019-08-03 NOTE — Progress Notes (Signed)
Office Visit Note   Patient: Gregory [name]           Date of Birth: 07/28/1960           MRN: [id number] Visit Date: 08/03/2019              Requested by: [name], MD [place],  [place] [address] PCP: [name], MD  Chief Complaint  Patient presents with  . Right Leg - Follow-up    06/24/2019 Stump Revision Right AKA      HPI: Patient presents today 6 weeks status post right above-knee amputation revision.  He has been lost to follow-up as he unfortunately contracted COVID-19 and was in the hospital.  He follows up today.  Overall he is doing well he is going to be under the care of a chronic pain management specialist and he is pleased about this.  He admits that he keeps rubbing the wound and it is very difficult for him to discontinue with this  Assessment & Plan: Visit Diagnoses: No diagnosis found.  Plan: Again encouraged him not to rub the wound.  I think overall he is doing well he does have a small suture abscess which I dressed today.  He has no purulent drainage.  He is already on antibiotics follow-up in 2 weeks  Follow-Up Instructions: No follow-ups on file.   Ortho Exam  Patient is alert, oriented, no adenopathy, well-dressed, normal affect, normal respiratory effort. Focused examination of his stump there is embedded nylon sutures from surgery.  Wound edges are well approximated without any necrosis.  Patient is noted to be rubbing his wound.  There is some redness around the wound no foul odor no surrounding cellulitis sutures were harvested today  Imaging: No results found.   Labs: Lab Results  Component Value Date   HGBA1C 8.6 (H) 05/23/2019   HGBA1C 7.7 (H) 11/17/2018   HGBA1C 8.0 (H) 06/04/2018   ESRSEDRATE 102 (H) 05/27/2019   ESRSEDRATE 120 (H) 03/25/2017   ESRSEDRATE 84 (H) 10/02/2016   CRP 14.3 (H) 07/15/2019   CRP 21.2 (H) 07/14/2019   CRP 13.7 (H) 07/13/2019   REPTSTATUS 07/16/2019 FINAL 07/11/2019   GRAMSTAIN   06/22/2019    FEW WBC PRESENT, PREDOMINANTLY PMN ABUNDANT GRAM POSITIVE COCCI MODERATE GRAM NEGATIVE RODS    CULT  07/11/2019    NO GROWTH 5 DAYS Performed at [facility], [place] [address] [place],  [address]    LABORGA ESCHERICHIA COLI 06/22/2019   LABORGA METHICILLIN RESISTANT STAPHYLOCOCCUS AUREUS 06/22/2019     Lab Results  Component Value Date   ALBUMIN 2.6 (L) 07/15/2019   ALBUMIN 2.7 (L) 07/14/2019   ALBUMIN 3.0 (L) 07/13/2019   PREALBUMIN 23.3 03/25/2017    Lab Results  Component Value Date   MG 2.5 (H) 07/15/2019   MG 2.3 07/14/2019   MG 2.3 07/13/2019   No results found for: VD25OH  Lab Results  Component Value Date   PREALBUMIN 23.3 03/25/2017   CBC EXTENDED Latest Ref Rng & Units 07/15/2019 07/14/2019 07/13/2019  WBC 4.0 - 10.5 K/uL 4.4 6.5 8.5  RBC 4.22 - 5.81 MIL/uL 3.28(L) 3.17(L) 3.46(L)  HGB 13.0 - 17.0 g/dL 8.1(L) 7.9(L) 8.6(L)  HCT 39.0 - 52.0 % 27.5(L) 27.5(L) 29.8(L)  PLT 150 - 400 K/uL 251 247 244  NEUTROABS 1.7 - 7.7 K/uL 3.3 5.6 6.7  LYMPHSABS 0.7 - 4.0 K/uL 0.7 0.5(L) 1.2     Body mass index is 33.36  kg/m.  Orders:  No orders of the defined types were placed in this encounter.  No orders of the defined types were placed in this encounter.    Procedures: No procedures performed  Clinical Data: No additional findings.  ROS:  All other systems negative, except as noted in the HPI. Review of Systems  Objective: Vital Signs: Ht 6' (1.829 m)   Wt 246 lb (111.6 kg)   BMI 33.36 kg/m   Specialty Comments:  No specialty comments available.  PMFS History: Patient Active Problem List   Diagnosis Date Noted  . Acute respiratory failure with hypoxia ([place]) 07/11/2019  . Acute respiratory disease due to COVID-19 virus 07/11/2019  . Wound infection 06/22/2019  . Abscess 06/22/2019  . CKD (chronic kidney disease), stage III 05/23/2019  . Tobacco use 05/23/2019  . Sepsis ([place]) 05/23/2019  . S/P AKA (above knee  amputation) unilateral, right ([place]) 12/05/2018  . Amputation stump infection ([place])   . Lymphedema of left leg   . Diabetic polyneuropathy associated with type 2 diabetes mellitus ([place])   . Above knee amputation of right lower extremity ([place]) 10/22/2018  . Dehiscence of amputation stump ([place])   . 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[place]) 08/12/2018  . Candidiasis 08/12/2018  . Necrosis of toe ([place]) 07/08/2018  . Amputated toe of left foot ([place]) 06/18/2018  . Streptococcal infection   . Pressure injury of skin 06/06/2018  . Cutaneous abscess of left foot   . Moderate protein-calorie malnutrition ([place])   . Cellulitis of left leg   . Septic arthritis ([place]) 06/03/2018  . Idiopathic chronic venous hypertension of left lower extremity with ulcer and inflammation ([place]) 11/14/2017  . Great toe amputation status, left 11/14/2017  . Osteomyelitis ([place]) 03/25/2017  . Skin cancer 03/25/2017  . Depression, recurrent ([place]) 11/05/2016  . Diabetic ulcer of toe of left foot ([place]) 10/02/2016  . 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 Chronic pain 09/27/2016  . Dyslipidemia associated with type 2 diabetes mellitus ([place]) 06/06/2016  . GERD without esophagitis 05/11/2016  . Hypertensive heart disease with congestive heart failure ([place]) 12/15/2015  . Spinal stenosis in cervical region 12/15/2015  . Type II diabetes mellitus with neurological manifestations ([place]) 12/15/2015  . Chronic diastolic CHF (congestive heart failure) ([place])   . Pulmonary hypertension ([place])   . Urinary retention   . Diskitis   . Foot drop, bilateral   . Sepsis with acute renal failure without septic shock ([place])   . MRSA bacteremia 10/03/2015  . COPD (chronic obstructive pulmonary disease) ([place]) 10/03/2015  . Acute osteomyelitis of left foot ([place]) 10/03/2015   Past Medical History:  Diagnosis Date  . Acquired absence of left great toe ([place])   . Acquired absence of right leg below knee ([place])   . Acute osteomyelitis of calcaneum,  right ([place]) 10/02/2016  . Acute respiratory failure ([place])   . Amputation stump infection ([place]) 08/12/2018  . Anemia   . Basal cell carcinoma, eyelid   . Cancer ([place])   . Candidiasis 08/12/2018  . CHF (congestive heart failure) (HCC)    chronic diastolic   . Chronic back pain   . Chronic kidney disease    stage 3   . Chronic multifocal osteomyelitis (H[facility])    of ankle and foot   . Chronic pain syndrome   . COPD (chronic obstructive pulmonary disease) ([place])   . DDD (degenerative disc disease), lumbar   . Depression    major depressive disorder   . Diabetes mellitus without complication (  [place])    type 2   . Diabetic ulcer of toe of left foot ([place]) 10/02/2016  . Difficulty in walking, not elsewhere classified   . 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 Foot drop, bilateral   . Foot drop, right 12/27/2015  . GERD (gastroesophageal reflux disease)   . Herpesviral vesicular dermatitis   . Hyperlipidemia   . Hyperlipidemia   . Hypertension   . Insomnia   . Malignant melanoma of other parts of face ([place])   . MRSA bacteremia   . Muscle weakness (generalized)   . Nasal congestion   . Necrosis of toe ([place]) 07/08/2018  . Neuromuscular dysfunction of bladder   . Osteomyelitis ([place])   . Osteomyelitis ([place])   . Osteomyelitis ([place])    right BKA  . Other idiopathic peripheral autonomic neuropathy   . Retention of urine, unspecified   . Squamous cell carcinoma of skin   . Unsteadiness on feet   . Urinary retention   . Urinary retention   . Urinary tract infection   . Wears dentures   . Wears glasses     Family History  Problem Relation Age of Onset  . Cancer Other   . Diabetes Other     Past Surgical History:  Procedure Laterality Date  . AMPUTATION Left 03/29/2017   Procedure: LEFT GREAT TOE AMPUTATION AT METATARSOPHALANGEAL JOINT;  Surgeon: [name], MD;  Location: [facility];  Service: Orthopedics;  Laterality: Left;  . AMPUTATION Right 03/29/2017   Procedure: RIGHT BELOW KNEE  AMPUTATION;  Surgeon: [name], MD;  Location: [facility];  Service: Orthopedics;  Laterality: Right;  . AMPUTATION Left 06/06/2018   Procedure: LEFT 2ND TOE AMPUTATION;  Surgeon: [name], MD;  Location: [facility];  Service: Orthopedics;  Laterality: Left;  . AMPUTATION Right 11/19/2018   Procedure: AMPUTATION ABOVE KNEE;  Surgeon: [name], MD;  Location: [facility];  Service: Orthopedics;  Laterality: Right;  . ANTERIOR CERVICAL CORPECTOMY N/A 11/25/2015   Procedure: ANTERIOR CERVICAL FIVE CORPECTOMY Cervical four - six fusion;  Surgeon: Consuella Lose, MD;  Location: [facility];  Service: Neurosurgery;  Laterality: N/A;  ANTERIOR CERVICAL FIVE CORPECTOMY Cervical four - six fusion  . APPLICATION OF WOUND VAC Right 10/22/2018   Procedure: Application Of Wound Vac;  Surgeon: [name], MD;  Location: [facility];  Service: Orthopedics;  Laterality: Right;  . CYSTOSCOPY WITH RETROGRADE URETHROGRAM N/A 04/25/2018   Procedure: CYSTOSCOPY WITH RETROGRADE URETHROGRAM/ BALLOON DILATION;  Surgeon: Kathie Rhodes, MD;  Location: [facility];  Service: Urology;  Laterality: N/A;  . MULTIPLE TOOTH EXTRACTIONS    . POSTERIOR CERVICAL FUSION/FORAMINOTOMY N/A 11/29/2015   Procedure: Cervical Three-Cervical Seven Posterior Cervical Laminectomy with Fusion;  Surgeon: Consuella Lose, MD;  Location: [facility];  Service: Neurosurgery;  Laterality: N/A;  Cervical Three-Cervical Seven Posterior Cervical Laminectomy with Fusion  . STUMP REVISION Right 10/22/2018   Procedure: REVISION RIGHT BELOW KNEE AMPUTATION;  Surgeon: [name], MD;  Location: [facility];  Service: Orthopedics;  Laterality: Right;  . STUMP REVISION Right 12/05/2018   Procedure: Revision Right Above Knee Amputation;  Surgeon: [name], MD;  Location: [facility];  Service: Orthopedics;  Laterality: Right;  . STUMP REVISION Right 05/29/2019   Procedure: REVISION RIGHT ABOVE KNEE AMPUTATION;  Surgeon: [name], MD;  Location: [facility];  Service:  Orthopedics;  Laterality: Right;  . STUMP REVISION Right 06/24/2019   Procedure: STUMP REVISION;  Surgeon: [name], MD;  Location: [facility];  Service: Orthopedics;  Laterality: Right;   Social History   Occupational History  . Not on file  Tobacco Use  . Smoking status: Current Every Day Smoker    Packs/day: 0.50    Types: Cigarettes  . Smokeless tobacco: Never Used  . Tobacco comment: 11/19/18  Substance and Sexual Activity  . Alcohol use: No  . Drug use: No  . Sexual activity: Not on file

## 2019-08-17 ENCOUNTER — Ambulatory Visit: Payer: Medicaid Other | Admitting: Physician Assistant

## 2019-09-25 ENCOUNTER — Telehealth: Payer: Self-pay | Admitting: Orthopedic Surgery

## 2019-09-25 ENCOUNTER — Telehealth: Payer: Self-pay | Admitting: Physician Assistant

## 2019-09-25 NOTE — Telephone Encounter (Signed)
Called patient left message to return call to schedule an appointment for the pain he is experiencing in his right upper leg

## 2019-09-25 NOTE — Telephone Encounter (Signed)
Patient was supposed to follow up on his amputation 6 weeks ago but never did. He can make an appointment for amputation follow up and if orthopedic we can evaluate arm. Cant rx anything without evaluating him.

## 2019-09-25 NOTE — Telephone Encounter (Signed)
Patient called asked if he can be referred to someone for the pain he is experiencing in his right upper extremity. Patient said he can't sleep because of the pain. Patient asked if he can be called back today?  Patient asked if he can get a muscle relaxer called into his pharmacy.  The number to contact patient is [phone]

## 2019-09-25 NOTE — Telephone Encounter (Signed)
Can you please call pt and make an appt. Needs eval first thank you!

## 2019-09-25 NOTE — Telephone Encounter (Signed)
Please see message below and advise.

## 2019-09-29 ENCOUNTER — Ambulatory Visit: Payer: Medicaid Other | Admitting: Orthopedic Surgery

## 2019-10-05 ENCOUNTER — Ambulatory Visit: Payer: Medicaid Other | Admitting: Orthopedic Surgery

## 2019-10-14 NOTE — Progress Notes (Signed)
error 

## 2019-10-15 ENCOUNTER — Ambulatory Visit
Admission: RE | Admit: 2019-10-15 | Discharge: 2019-10-15 | Disposition: A | Discharge status: Home / Self Care | Payer: Self-pay | Source: Ambulatory Visit | Attending: Radiation Oncology | Admitting: Radiation Oncology

## 2019-10-15 ENCOUNTER — Other Ambulatory Visit: Payer: Self-pay | Admitting: Radiation Oncology

## 2019-10-15 ENCOUNTER — Ambulatory Visit: Admission: RE | Admit: 2019-10-15 | Payer: Medicaid Other | Source: Ambulatory Visit

## 2019-10-15 ENCOUNTER — Ambulatory Visit
Admission: RE | Admit: 2019-10-15 | Discharge: 2019-10-15 | Disposition: A | Discharge status: Home / Self Care | Payer: Medicaid Other | Source: Ambulatory Visit | Attending: Orthopedic Surgery | Admitting: Orthopedic Surgery

## 2019-10-15 DIAGNOSIS — C439 Malignant melanoma of skin, unspecified: Secondary | ICD-10-CM

## 2019-10-15 DIAGNOSIS — C4339 Malignant melanoma of other parts of face: Secondary | ICD-10-CM | POA: Insufficient documentation

## 2019-10-26 NOTE — Progress Notes (Signed)
Histology and Location of Primary Skin Cancer: Melanoma recurrence on his right cheek.  Gregory [name] presented with the following signs/symptoms: He was seen in December and was noted to have a nodule under his right eye which FNA showed + melanoma metastasis, 1.5-2 cm.  CT CAP 07/03/2019: No convincing of metastatic disease within the chest. However, there are scattered ground glass opacities within all pulmonary lobes, suspicious for multifocal pneumonia including atypical/viral etiologies with COVID-19 pneumonia within the differential. Dense focus along the posterior wall of the gallbladder body measuring 8 mm. This could relate to a gallstone though an enhancing lesion/metastasis is not excluded given history of melanoma. Advise right upper quadrant ultrasound for further evaluation.   Pathology Report 09/07/2019   Cytology Report 06/17/2019    Past/Anticipated interventions by patient's surgeon/dermatologist for current problematic lesion, if any: Right cheek, s/p wide local excision with positive margins 08/24/2019  Past skin cancers, if any:  1) Location/Histology/Intervention: Right Eyebrow 2018-  2) Location/Histology/Intervention: Right Cheek  3) Location/Histology/Intervention:   History of Blistering sunburns, if any: No  Dr. 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   Plan:   1. Wound care discussed and no specific wound care 2. Discussed with patient risks of positive margins and patient is at high risk for recurrence, recommended additional interventions surgery (technically difficult with morbid wound), radiation, versus medical therapy. Patient would like to speak with rad onc and med onc and then return to clinic in 3 months to review options. 3. Otherwise no acute post-op  complications   SAFETY ISSUES:  Prior radiation? No  Pacemaker/ICD? No  Possible current pregnancy? N/a  Is the patient on methotrexate? No  Current Complaints / other details:   -Lives at [facility]

## 2019-10-27 ENCOUNTER — Ambulatory Visit
Admission: RE | Admit: 2019-10-27 | Discharge: 2019-10-27 | Disposition: A | Discharge status: Home / Self Care | Payer: Medicaid Other | Source: Ambulatory Visit | Attending: Radiation Oncology | Admitting: Radiation Oncology

## 2019-10-27 ENCOUNTER — Other Ambulatory Visit: Payer: Self-pay

## 2019-10-27 ENCOUNTER — Encounter: Payer: Self-pay | Admitting: Radiation Oncology

## 2019-10-27 ENCOUNTER — Telehealth: Payer: Self-pay | Admitting: *Deleted

## 2019-10-27 DIAGNOSIS — C4339 Malignant melanoma of other parts of face: Secondary | ICD-10-CM

## 2019-10-27 NOTE — Telephone Encounter (Signed)
CALLED PATIENT TO INFORM OF APPT. WITH DR. SHADAD ON 11-05-19 - ARRIVAL TIME- 10:45 AM , LVM FOR A RETURN CALL

## 2019-10-27 NOTE — Progress Notes (Signed)
Radiation Oncology         [phone] ________________________________  Initial Outpatient Consultation - Conducted via telephone due to current COVID-19 concerns for limiting patient exposure  I spoke with the patient to conduct this consult visit via telephone to spare the patient unnecessary potential exposure in the healthcare setting during the current COVID-19 pandemic. The patient was notified in advance and was offered a [place] meeting to allow for face to face communication but unfortunately reported that they did not have the appropriate resources/technology to support such a visit and instead preferred to proceed with a telephone consult.    Name: Gregory [name]        MRN: [id number]  Date of Service: 10/27/2019 DOB: [birth year]  YQ:[id number], Brandt Loosen, MD  Health, [place]: Health, [facility]*   DIAGNOSIS: The encounter diagnosis was Melanoma of cheek ([place]).   HISTORY OF PRESENT ILLNESS: Gregory [name] is a 59 y.o. male seen at the request of Dr. Jyl Heinz in general surgery at [facility] for a diagnosis of melanoma.  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  He recently presented after noting a lesion in the right cheek likely representing recurrent in-transit disease.  This was biopsied with fine-needle aspiration in December 2020 and consistent with melanoma, and at that time the lesion was about 1.5 to 2 cm size.  Staging CT imaging did not reveal any evidence of obvious metastatic disease though there was scattered groundglass opacities in all pulmonary lobes, it was recommended that he have repeat in 6 to 8 weeks though this does not appear to have occurred yet.  He subsequently underwent surgical resection on 08/24/2019.  Final pathology revealed Malignant melanoma the tumor was 2.8 cm in greatest dimension and the neoplasm probably extended to the inked margin.  Given the high risk features on his  pathology for the positive margin, he met with Dr. Crisoforo Oxford to review options of additional therapy to try and reduce risks of recurrence and she is of radiotherapy to do so.  He does not appear to be established with medical oncology but it appears Dr. Crisoforo Oxford sent a referral for medical oncology but he has yet to be seen.    PREVIOUS RADIATION THERAPY: No   PAST MEDICAL HISTORY:  Past Medical History:  Diagnosis Date  . Acquired absence of left great toe ([place])   . Acquired absence of right leg below knee ([place])   . Acute osteomyelitis of calcaneum, right ([place]) 10/02/2016  . Acute respiratory failure ([place])   . Amputation stump infection ([place]) 08/12/2018  . Anemia   . Basal cell carcinoma, eyelid   . Cancer ([place])   . Candidiasis 08/12/2018  . CHF (congestive heart failure) (HCC)    chronic diastolic   . Chronic back pain   . Chronic kidney disease    stage 3   . Chronic multifocal osteomyelitis (HCC)    of ankle and foot   . Chronic pain syndrome   . COPD (chronic obstructive pulmonary disease) ([place])   . DDD (degenerative disc disease), lumbar   . Depression    major depressive disorder   . Diabetes mellitus without complication ([place])    type 2   . Diabetic ulcer of toe of left foot ([place]) 10/02/2016  . Difficulty in walking, not elsewhere classified   . 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 Foot drop, bilateral   . Foot  drop, right 12/27/2015  . GERD (gastroesophageal reflux disease)   . Herpesviral vesicular dermatitis   . Hyperlipidemia   . Hyperlipidemia   . Hypertension   . Insomnia   . Malignant melanoma of other parts of face ([place])   . MRSA bacteremia   . Muscle weakness (generalized)   . Nasal congestion   . Necrosis of toe ([place]) 07/08/2018  . Neuromuscular dysfunction of bladder   . Osteomyelitis ([place])   . Osteomyelitis ([place])   . 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 Other idiopathic peripheral autonomic neuropathy   . Retention of urine, unspecified   . Squamous cell  carcinoma of skin   . Unsteadiness on feet   . Urinary retention   . Urinary retention   . Urinary tract infection   . Wears dentures   . Wears glasses        PAST SURGICAL HISTORY: Past Surgical History:  Procedure Laterality Date  . AMPUTATION Left 03/29/2017   Procedure: LEFT GREAT TOE AMPUTATION AT METATARSOPHALANGEAL JOINT;  Surgeon: [name], MD;  Location: [place];  Service: Orthopedics;  Laterality: Left;  . AMPUTATION Right 03/29/2017   Procedure: RIGHT BELOW KNEE AMPUTATION;  Surgeon: [name], MD;  Location: [place];  Service: Orthopedics;  Laterality: Right;  . AMPUTATION Left 06/06/2018   Procedure: LEFT 2ND TOE AMPUTATION;  Surgeon: [name], MD;  Location: [place];  Service: Orthopedics;  Laterality: Left;  . AMPUTATION Right 11/19/2018   Procedure: AMPUTATION ABOVE KNEE;  Surgeon: [name], MD;  Location: [place];  Service: Orthopedics;  Laterality: Right;  . ANTERIOR CERVICAL CORPECTOMY N/A 11/25/2015   Procedure: ANTERIOR CERVICAL FIVE CORPECTOMY Cervical four - six fusion;  Surgeon: Consuella Lose, MD;  Location: [place] [facility];  Service: Neurosurgery;  Laterality: N/A;  ANTERIOR CERVICAL FIVE CORPECTOMY Cervical four - six fusion  . APPLICATION OF WOUND VAC Right 10/22/2018   Procedure: Application Of Wound Vac;  Surgeon: [name], MD;  Location: [place];  Service: Orthopedics;  Laterality: Right;  . CYSTOSCOPY WITH RETROGRADE URETHROGRAM N/A 04/25/2018   Procedure: CYSTOSCOPY WITH RETROGRADE URETHROGRAM/ BALLOON DILATION;  Surgeon: Kathie Rhodes, MD;  Location: [facility];  Service: Urology;  Laterality: N/A;  . MULTIPLE TOOTH EXTRACTIONS    . POSTERIOR CERVICAL FUSION/FORAMINOTOMY N/A 11/29/2015   Procedure: Cervical Three-Cervical Seven Posterior Cervical Laminectomy with Fusion;  Surgeon: Consuella Lose, MD;  Location: [place] [facility];  Service: Neurosurgery;  Laterality: N/A;  Cervical Three-Cervical Seven Posterior Cervical Laminectomy with Fusion  .  STUMP REVISION Right 10/22/2018   Procedure: REVISION RIGHT BELOW KNEE AMPUTATION;  Surgeon: [name], MD;  Location: [place];  Service: Orthopedics;  Laterality: Right;  . STUMP REVISION Right 12/05/2018   Procedure: Revision Right Above Knee Amputation;  Surgeon: [name], MD;  Location: [place];  Service: Orthopedics;  Laterality: Right;  . STUMP REVISION Right 05/29/2019   Procedure: REVISION RIGHT ABOVE KNEE AMPUTATION;  Surgeon: [name], MD;  Location: [place];  Service: Orthopedics;  Laterality: Right;  . STUMP REVISION Right 06/24/2019   Procedure: STUMP REVISION;  Surgeon: [name], MD;  Location: [place];  Service: Orthopedics;  Laterality: Right;     FAMILY HISTORY:  Family History  Problem Relation Age of Onset  . Cancer Other   . Diabetes Other      SOCIAL HISTORY:  reports that he has been smoking cigarettes. He has been smoking about 0.50 packs  per day. He has never used smokeless tobacco. He reports that he does not drink alcohol or use drugs. The patient is single and lives in [place]. His daughter [name] is an Therapist, sports at [employer].   ALLERGIES: Latex   MEDICATIONS:  Current Outpatient Medications  Medication Sig Dispense Refill  . acetaminophen (TYLENOL) 325 MG tablet Take 650 mg by mouth every 4 (four) hours as needed (general discomfort).     [place] Kitchen albuterol (PROVENTIL HFA;VENTOLIN HFA) 108 (90 Base) MCG/ACT inhaler Inhale 2 puffs into the lungs every 6 (six) hours as needed for wheezing or shortness of breath.    Marland Kitchen amLODipine (NORVASC) 10 MG tablet Take 10 mg by mouth daily. for HTN    . Baclofen 5 MG TABS Take 5 mg by mouth 3 (three) times daily.     Mar[place] Kitchen buPROPion (WELLBUTRIN XL) 300 MG 24 hr tablet Take 300 mg by mouth at bedtime.    . cetirizine (ZYRTEC) 10 MG tablet Take 10 mg by mouth daily.    . Cholecalciferol (VITAMIN D) 50 MCG (2000 UT) tablet Take 2,000 Units by mouth daily.    . Cholecalciferol (VITAMIN D-3) 125 MCG (5000 UT) TABS  Take 1 tablet by mouth daily.    . Cholecalciferol (VITAMIN D-3) 5000 units TABS Take 5,000 Units by mouth daily.    Marland Kitchen docusate sodium (COLACE) 100 MG capsule Take 1 capsule (100 mg total) by mouth 2 (two) times daily. 10 capsule 0  . eszopiclone (LUNESTA) 1 MG TABS tablet Take 1 mg by mouth at bedtime. Take immediately before bedtime    . ferrous sulfate 325 (65 FE) MG tablet Take 1 tablet (325 mg total) by mouth daily with breakfast. 30 tablet 0  . finasteride (PROSCAR) 5 MG tablet Take 5 mg by mouth daily.    Mar[place] Kitchen glipiZIDE (GLUCOTROL) 10 MG tablet Take 10 mg by mouth daily before breakfast.    . ibuprofen (ADVIL) 800 MG tablet Take 800 mg by mouth 3 (three) times daily.    . insulin aspart (NOVOLOG) 100 UNIT/ML injection Inject 0-9 Units into the skin 3 (three) times daily with meals. 8.1 mL 0  . insulin lispro (HUMALOG KWIKPEN) 100 UNIT/ML KwikPen Inject 16 Units into the skin 3 (three) times daily before meals.     . lidocaine (LIDODERM) 5 % Place 1 patch onto the skin daily. Apply to Right stump. Remove & Discard patch within 12 hours or as directed by MD    . LYRICA 300 MG capsule Take 300 mg by mouth 2 (two) times daily.    . magnesium oxide (MAG-OX) 400 MG tablet Take 400 mg by mouth 2 (two) times daily.    . metoprolol tartrate (LOPRESSOR) 25 MG tablet Take 0.5 tablets (12.5 mg total) by mouth 2 (two) times daily. 30 tablet 0  . mirabegron ER (MYRBETRIQ) 50 MG TB24 tablet Take 50 mg by mouth daily.    . mometasone (NASONEX) 50 MCG/ACT nasal spray Place 2 sprays into the nose at bedtime.     . nicotine (NICODERM CQ - DOSED IN MG/24 HOURS) 14 mg/24hr patch Place 1 patch (14 mg total) onto the skin at bedtime. 28 patch 0  . omeprazole (PRILOSEC) 20 MG capsule Take 40 mg by mouth daily before breakfast.     . oxybutynin (DITROPAN-XL) 10 MG 24 hr tablet Take 10 mg by mouth at bedtime.    [place] Kitchen PARoxetine (PAXIL) 10 MG tablet Take 10 mg by mouth at bedtime.     Mar[place] Kitchen  rosuvastatin (CRESTOR) 10 MG tablet  Take 10 mg by mouth at bedtime.    . senna-docusate (SENOKOT-S) 8.6-50 MG tablet Take 2 tablets by mouth at bedtime.    . tamsulosin (FLOMAX) 0.4 MG CAPS capsule Take 0.4 mg by mouth every evening.     . terbinafine (LAMISIL) 250 MG tablet Take 250 mg by mouth daily.    . Tiotropium Bromide Monohydrate (SPIRIVA RESPIMAT) 1.25 MCG/ACT AERS Inhale 2 puffs into the lungs daily.    . vitamin B-12 (CYANOCOBALAMIN) 500 MCG tablet Take 1,000 mcg by mouth daily.     . vitamin C (ASCORBIC ACID) 250 MG tablet Take 500 mg by mouth 2 (two) times daily.    Mar[place] Kitchen zinc sulfate (ZINC-220) 220 (50 Zn) MG capsule Take 220 mg by mouth daily.     No current facility-administered medications for this encounter.     REVIEW OF SYSTEMS: On review of systems, the patient reports that he is doing well overall. He reports he is having a difficult time with right lower eyelid edema. He denies any chest pain, shortness of breath, cough, fevers, chills, night sweats, unintended weight changes. He denies any bowel or bladder disturbances, and denies abdominal pain, nausea or vomiting. He denies any new musculoskeletal or joint aches or pains. A complete review of systems is obtained and is otherwise negative.     PHYSICAL EXAM:  Wt Readings from Last 3 Encounters:  08/03/19 246 lb (111.6 kg)  07/11/19 246 lb 14.6 oz (112 kg)  07/02/19 247 lb (112 kg)   Unable to assess due to encounter type.   ECOG = 1  0 - Asymptomatic (Fully active, able to carry on all predisease activities without restriction)  1 - Symptomatic but completely ambulatory (Restricted in physically strenuous activity but ambulatory and able to carry out work of a light or sedentary nature. For example, light housework, office work)  2 - Symptomatic, <50% in bed during the day (Ambulatory and capable of all self care but unable to carry out any work activities. Up and about more than 50% of waking hours)  3 - Symptomatic, >50% in bed, but not bedbound  (Capable of only limited self-care, confined to bed or chair 50% or more of waking hours)  4 - Bedbound (Completely disabled. Cannot carry on any self-care. Totally confined to bed or chair)  5 - Death   Eustace Pen MM, Creech RH, Tormey DC, et al. [phone]). "Toxicity and response criteria of the [facility]". [place] Oncol. 5 (6): 649-55    LABORATORY DATA:  Lab Results  Component Value Date   WBC 4.4 07/15/2019   HGB 8.1 (L) 07/15/2019   HCT 27.5 (L) 07/15/2019   MCV 83.8 07/15/2019   PLT 251 07/15/2019   Lab Results  Component Value Date   NA 140 07/15/2019   K 4.8 07/15/2019   CL 103 07/15/2019   CO2 27 07/15/2019   Lab Results  Component Value Date   ALT 10 07/15/2019   AST 14 (L) 07/15/2019   ALKPHOS 72 07/15/2019   BILITOT 0.5 07/15/2019      RADIOGRAPHY: No results found.     IMPRESSION/PLAN: 1.         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 Dr. Lisbeth Renshaw discusses the pathology findings and reviews the nature of treatment of melanoma with radiotherapy for scenarios of high risk postoperative findings. Dr. Lisbeth Renshaw would like a CT of  the neck and maxillofacial structures for dept of treatment planning and to rule out nodal disease since he doesn't appear to have  We discussed the risks, benefits, short, and long term effects of radiotherapy, and the patient is interested in proceeding. Dr. Lisbeth Renshaw discusses the delivery and logistics of radiotherapy and anticipates a course of 4 weeks of electron based treatment. We will also refer him to medical oncology here at the cancer center to discuss possible need of systemic therapy. Once we know the timing of his CT imaging we will coordinate a time for him to come in and plan his electron set up at the linear accelerator. He is in agreement with this plan and at that time will sign consent to proceed. 2.         Multifocal pulmonary groundglass  opacities on prior imaging. This will need to be followed expectantly. 3. Right periorbital edema likely secondary to lymphedema. A referral will be sent for urgent evaluation to see if improvement can be made with the providers in the PT clinic.       Given current concerns for patient exposure during the COVID-19 pandemic, this encounter was conducted via telephone.  The patient has provided two factor identification and has given verbal consent for this type of encounter and has been advised to only accept a meeting of this type in a secure network environment. The time spent during this encounter was 60 minutes including preparation, discussion, and coordination of the patient's care. The attendants for this meeting include Blenda Nicely, RN, Dr. Lisbeth Renshaw, Hayden Pedro  and Cephus Slater.  During the encounter,  Blenda Nicely, RN, Dr. Lisbeth Renshaw, and Hayden Pedro were located at [facility] Radiation Oncology Department.  Cephus Slater was located at home.   The above documentation reflects my direct findings during this shared patient visit. Please see the separate note by Dr. Lisbeth Renshaw on this date for the remainder of the patient's plan of care.    Carola Rhine, PAC

## 2019-11-03 ENCOUNTER — Other Ambulatory Visit: Payer: Self-pay

## 2019-11-03 ENCOUNTER — Telehealth: Payer: Self-pay | Admitting: *Deleted

## 2019-11-03 ENCOUNTER — Encounter: Payer: Self-pay | Admitting: Physical Therapy

## 2019-11-03 ENCOUNTER — Ambulatory Visit: Payer: Medicaid Other | Attending: Radiation Oncology | Admitting: Physical Therapy

## 2019-11-03 DIAGNOSIS — I890 Lymphedema, not elsewhere classified: Secondary | ICD-10-CM | POA: Diagnosis present

## 2019-11-03 DIAGNOSIS — R293 Abnormal posture: Secondary | ICD-10-CM | POA: Diagnosis present

## 2019-11-03 NOTE — Therapy (Signed)
[place], [place], [address] Phone: [phone]   Fax:  [phone]  Physical Therapy Evaluation  Patient Details  Name: Gregory [name] MRN: [id number] Date of Birth: 03-05-1961 Referring Provider (PT): Shona Simpson    Encounter Date: 11/03/2019  PT End of Session - 11/03/19 1600    Visit Number  1    Number of Visits  12    Date for PT Re-Evaluation  01/04/20    PT Start Time  1500    PT Stop Time  1545    PT Time Calculation (min)  45 min    Activity Tolerance  Patient tolerated treatment well    Behavior During Therapy  [facility] for tasks assessed/performed       Past Medical History:  Diagnosis Date  . Acquired absence of left great toe ([place])   . Acquired absence of right leg below knee ([place])   . Acute osteomyelitis of calcaneum, right ([place]) 10/02/2016  . Acute respiratory failure ([place])   . Amputation stump infection ([place]) 08/12/2018  . Anemia   . Basal cell carcinoma, eyelid   . Cancer ([place])   . Candidiasis 08/12/2018  . CHF (congestive heart failure) (HCC)    chronic diastolic   . Chronic back pain   . Chronic kidney disease    stage 3   . Chronic multifocal osteomyelitis (HCC)    of ankle and foot   . Chronic pain syndrome   . COPD (chronic obstructive pulmonary disease) ([place])   . DDD (degenerative disc disease), lumbar   . Depression    major depressive disorder   . Diabetes mellitus without complication ([place])    type 2   . Diabetic ulcer of toe of left foot ([place]) 10/02/2016  . Difficulty in walking, not elsewhere classified   . 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 Foot drop, bilateral   . Foot drop, right 12/27/2015  . GERD (gastroesophageal reflux disease)   . Herpesviral vesicular dermatitis   . Hyperlipidemia   . Hyperlipidemia   . Hypertension   . Insomnia   . Malignant melanoma of other parts of face ([place])   . MRSA bacteremia   . Muscle weakness (generalized)   . Nasal  congestion   . Necrosis of toe ([place]) 07/08/2018  . Neuromuscular dysfunction of bladder   . Osteomyelitis ([place])   . Osteomyelitis ([place])   . Osteomyelitis ([place])    right BKA  . Other idiopathic peripheral autonomic neuropathy   . Retention of urine, unspecified   . Squamous cell carcinoma of skin   . Unsteadiness on feet   . Urinary retention   . Urinary retention   . Urinary tract infection   . Wears dentures   . Wears glasses     Past Surgical History:  Procedure Laterality Date  . AMPUTATION Left 03/29/2017   Procedure: LEFT GREAT TOE AMPUTATION AT METATARSOPHALANGEAL JOINT;  Surgeon: [name], MD;  Location: [facility];  Service: Orthopedics;  Laterality: Left;  . AMPUTATION Right 03/29/2017   Procedure: RIGHT BELOW KNEE AMPUTATION;  Surgeon: [name], MD;  Location: [facility];  Service: Orthopedics;  Laterality: Right;  . AMPUTATION Left 06/06/2018   Procedure: LEFT 2ND TOE AMPUTATION;  Surgeon: [name], MD;  Location: [facility];  Service: Orthopedics;  Laterality: Left;  . AMPUTATION Right 11/19/2018   Procedure: AMPUTATION ABOVE KNEE;  Surgeon: [name], MD;  Location: [facility];  Service: Orthopedics;  Laterality: Right;  .  ANTERIOR CERVICAL CORPECTOMY N/A 11/25/2015   Procedure: ANTERIOR CERVICAL FIVE CORPECTOMY Cervical four - six fusion;  Surgeon: Consuella Lose, MD;  Location: [facility];  Service: Neurosurgery;  Laterality: N/A;  ANTERIOR CERVICAL FIVE CORPECTOMY Cervical four - six fusion  . APPLICATION OF WOUND VAC Right 10/22/2018   Procedure: Application Of Wound Vac;  Surgeon: [name], MD;  Location: [facility];  Service: Orthopedics;  Laterality: Right;  . CYSTOSCOPY WITH RETROGRADE URETHROGRAM N/A 04/25/2018   Procedure: CYSTOSCOPY WITH RETROGRADE URETHROGRAM/ BALLOON DILATION;  Surgeon: Kathie Rhodes, MD;  Location: [facility];  Service: Urology;  Laterality: N/A;  . MULTIPLE TOOTH EXTRACTIONS    . POSTERIOR CERVICAL FUSION/FORAMINOTOMY N/A 11/29/2015    Procedure: Cervical Three-Cervical Seven Posterior Cervical Laminectomy with Fusion;  Surgeon: Consuella Lose, MD;  Location: [facility];  Service: Neurosurgery;  Laterality: N/A;  Cervical Three-Cervical Seven Posterior Cervical Laminectomy with Fusion  . STUMP REVISION Right 10/22/2018   Procedure: REVISION RIGHT BELOW KNEE AMPUTATION;  Surgeon: [name], MD;  Location: [facility];  Service: Orthopedics;  Laterality: Right;  . STUMP REVISION Right 12/05/2018   Procedure: Revision Right Above Knee Amputation;  Surgeon: [name], MD;  Location: [facility];  Service: Orthopedics;  Laterality: Right;  . STUMP REVISION Right 05/29/2019   Procedure: REVISION RIGHT ABOVE KNEE AMPUTATION;  Surgeon: [name], MD;  Location: [facility];  Service: Orthopedics;  Laterality: Right;  . STUMP REVISION Right 06/24/2019   Procedure: STUMP REVISION;  Surgeon: [name], MD;  Location: [facility];  Service: Orthopedics;  Laterality: Right;    There were no vitals filed for this visit.   Subjective Assessment - 11/03/19 1513    Subjective  Pt says he has had swelling under his left eye since 2018 that has gotten worse since his recent surgery "It feels tighter" and he wants to see if something can be done about it.  Pt is not sure if he will have radiation yet on his right cheek    Pertinent History  H/x of Melanoma s/p neck dissection and right eyebrow excision in 2018, and local recurrence in right cheek, and since surgery has had periorbital edema,  Probable lymphedema.Past histroy includes blindness in the left eye, DM with complications for ight AKA with multiple revisions, Left frst and second toe amputation.    Patient Stated Goals  to get his eye swelling down    Currently in Pain?  No/denies         [facility] PT Assessment - 11/03/19 0001      Assessment   Medical Diagnosis  melanoma of cheek    Referring Provider (PT)  Shona Simpson     Onset Date/Surgical Date  09/07/19   previous surgery in  2018    Hand Dominance  Right      Precautions   Precautions  Sternal    Precaution Comments  at risk for lymphedema , fall risk       Restrictions   Weight Bearing Restrictions  Yes      Balance Screen   Has the patient fallen in the past 6 months  No    Has the patient had a decrease in activity level because of a fear of falling?   No    Is the patient reluctant to leave their home because of a fear of falling?   No      Home Environment   Living Environment  Assisted living  Home Equipment  Wheelchair - manual      Prior Function   Level of Independence  Requires assistive device for independence   pt states he is mostly independent at his facility      Cognition   Overall Cognitive Status  Within Functional Limits for tasks assessed      Observation/Other Assessments   Observations  pt has visible swelling under right  with scar on right cheek       Sensation   Light Touch  Not tested      Coordination   Gross Motor Movements are Fluid and Coordinated  Not tested      Posture/Postural Control   Posture/Postural Control  Postural limitations    Postural Limitations  Rounded Shoulders;Forward head      ROM / Strength   AROM / PROM / Strength  AROM      AROM   Overall AROM   Deficits    Overall AROM Comments  UE are [facility] for pt to independently propel wheelchair       Palpation   Palpation comment  soft lymphedema in right undereye area with decreased skin excursion at scar on right cheek         LYMPHEDEMA/ONCOLOGY QUESTIONNAIRE - 11/03/19 1554      Type   Cancer Type  melanoma       What other symptoms do you have   Are you Having Heaviness or Tightness  Yes    Are you having Pain  No    Are you having pitting edema  Yes    Body Site  right periorbital area       Lymphedema Stage   Stage  STAGE 2 SPONTANEOUSLY IRREVERSIBLE      Lymphedema Assessments   Lymphedema Assessments  --   right eye open 1.3 cm left eye open 1.9 cm                 Objective measurements completed on examination: See above findings.                PT Short Term Goals - 11/03/19 1612      PT SHORT TERM GOAL #1   Title  Pt will be independent in self managment of periorbital lymphedema with self manual lymph drainage , compression and exercise    Time  4    Period  Weeks    Status  New        PT Long Term Goals - 11/03/19 1610      PT LONG TERM GOAL #1   Title  Pt wil increase right eye openin to 1.9 cm    Baseline  1.3 cm on 11/03/2019    Time  8    Period  Weeks    Status  New      PT LONG TERM GOAL #2   Title  --    Baseline  --    Time  --    Period  --    Status  --             Plan - 11/03/19 1602    Clinical Impression Statement  Lathen Cucco comes to PT seeking treatment for chronic swelling around his right eye after surgery for a melanoma of his right eyebrow that has gotten worse after an excision for a recurrance of his right cheek. He will benefit from treatment and instrution in self care for self manual lymph drainage, use of compression and exercises.  He has several other physical issues that will not be addressed at this time.    Personal Factors and Comorbidities  Comorbidity 3+    Comorbidities  melanoma recurrance, right AKA with several revisions, Left great and first toe amputation    Stability/Clinical Decision Making  Evolving/Moderate complexity   Pt may be having radiation   Clinical Decision Making  Moderate    Rehab Potential  Good    PT Frequency  2x / week   one time a week for 2 weeks then 2x a week for 4 weeks as needed   PT Duration  8 weeks    PT Treatment/Interventions  ADLs/Self Care Home Management;Therapeutic exercise;Patient/family education;Orthotic Fit/Training;Manual lymph drainage;Taping;Compression bandaging    PT Next Visit Plan  teach Self MLD, try kinesiotape. ??? eye compression patch ??    Recommended Other Services  demographics sent to [facility]  to be measured for [place] eye patch on Friday May 21    Consulted and Agree with Plan of Care  Patient       Patient will benefit from skilled therapeutic intervention in order to improve the following deficits and impairments:  Increased edema, Decreased knowledge of use of DME, Decreased knowledge of precautions, Postural dysfunction  Visit Diagnosis: Lymphedema, not elsewhere classified - Plan: PT plan of care cert/re-cert  Abnormal posture - Plan: PT plan of care cert/re-cert     Problem List Patient Active Problem List   Diagnosis Date Noted  . Melanoma of cheek ([place]) 10/15/2019  . Acute respiratory failure with hypoxia ([place]) 07/11/2019  . Acute respiratory disease due to COVID-19 virus 07/11/2019  . Wound infection 06/22/2019  . Abscess 06/22/2019  . CKD (chronic kidney disease), stage III 05/23/2019  . Tobacco use 05/23/2019  . Sepsis ([place]) 05/23/2019  . S/P AKA (above knee amputation) unilateral, right ([place]) 12/05/2018  . Amputation stump infection ([place])   . Lymphedema of left leg   . Diabetic polyneuropathy associated with type 2 diabetes mellitus ([place])   . Above knee amputation of right lower extremity ([place]) 10/22/2018  . Dehiscence of amputation stump ([place])   . 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[place]) 08/12/2018  . Candidiasis 08/12/2018  . Necrosis of toe ([place]) 07/08/2018  . Amputated toe of left foot ([place]) 06/18/2018  . Streptococcal infection   . Pressure injury of skin 06/06/2018  . Cutaneous abscess of left foot   . Moderate protein-calorie malnutrition ([place])   . Cellulitis of left leg   . Septic arthritis ([place]) 06/03/2018  . Idiopathic chronic venous hypertension of left lower extremity with ulcer and inflammation ([place]) 11/14/2017  . Great toe amputation status, left 11/14/2017  . Osteomyelitis ([place]) 03/25/2017  . Skin cancer 03/25/2017  . Depression, recurrent ([place]) 11/05/2016  . Diabetic ulcer of toe of left foot ([place]) 10/02/2016  . Epidural  abscess 10/02/2016  . Chronic pain 09/27/2016  . Dyslipidemia associated with type 2 diabetes mellitus ([place]) 06/06/2016  . GERD without esophagitis 05/11/2016  . Hypertensive heart disease with congestive heart failure ([place]) 12/15/2015  . Spinal stenosis in cervical region 12/15/2015  . Type II diabetes mellitus with neurological manifestations ([place]) 12/15/2015  . Chronic diastolic CHF (congestive heart failure) ([place])   . Pulmonary hypertension ([place])   . Urinary retention   . Diskitis   . Foot drop, bilateral   . Sepsis with acute renal failure without septic shock ([place])   . MRSA bacteremia 10/03/2015  . COPD (chronic obstructive pulmonary disease) ([place]) 10/03/2015  . Acute osteomyelitis  of left foot ([place]) 10/03/2015   Donato Heinz. Owens Shark PT  Norwood Levo 11/03/2019, 4:16 PM  [place], [place], [address] Phone: [phone]   Fax:  [phone]  Name: TACOMA DRUMM MRN: [id number] Date of Birth: 04/13/1961

## 2019-11-03 NOTE — Telephone Encounter (Signed)
CALLED PATIENT TO INFORM OF CT FOR 11-09-19- ARRIVAL TIME- 7:15 AM @, PT. TO HAVE WATER ONLY - 4 HRS. PRIOR TO TEST, TEST TO BE DONE @ [facility], SPOKE WITH PATIENT AND HE IS AWARE OF THESE TESTS

## 2019-11-04 ENCOUNTER — Telehealth: Payer: Self-pay | Admitting: *Deleted

## 2019-11-04 ENCOUNTER — Telehealth: Payer: Self-pay | Admitting: Radiation Oncology

## 2019-11-04 NOTE — Telephone Encounter (Signed)
CALLED PATIENT TO INFORM OF SCANS FOR 11-09-19, LVM FOR A RETURN CALL

## 2019-11-04 NOTE — Telephone Encounter (Signed)
I spoke with the patient's daughter to summarize his treatment recommendations thus far. We will get CTs on Monday, and he is seeing Dr. Alen Blew tomorrow. I will follow up after we have this information.

## 2019-11-05 ENCOUNTER — Inpatient Hospital Stay: Payer: Medicaid Other | Attending: Oncology | Admitting: Oncology

## 2019-11-05 ENCOUNTER — Other Ambulatory Visit: Payer: Self-pay | Admitting: Radiation Oncology

## 2019-11-05 ENCOUNTER — Telehealth: Payer: Self-pay

## 2019-11-05 NOTE — Telephone Encounter (Signed)
Patient did not show for today's new patient appointment at 11:00. Called numbers listed for patient and his daughter and got voicemail for each. Left message to call [phone] to reschedule.

## 2019-11-09 ENCOUNTER — Telehealth: Payer: Self-pay | Admitting: Oncology

## 2019-11-09 ENCOUNTER — Ambulatory Visit (HOSPITAL_COMMUNITY): Admission: RE | Admit: 2019-11-09 | Payer: Medicaid Other | Source: Ambulatory Visit

## 2019-11-09 ENCOUNTER — Encounter (HOSPITAL_COMMUNITY): Payer: Self-pay

## 2019-11-09 NOTE — Telephone Encounter (Signed)
I cld Mr. Seepersad to reschedule Mr. Trobaugh missed appt w/Dr. Alen Blew from 5/20. A new appt has been scheduled for Mr. Cottam to see Dr. Alen Blew on 5/28 at 2pm Pt has been cld and made aware to arrive 15 minutes early.

## 2019-11-12 ENCOUNTER — Telehealth: Payer: Self-pay | Admitting: *Deleted

## 2019-11-12 ENCOUNTER — Ambulatory Visit (HOSPITAL_COMMUNITY)
Admission: RE | Admit: 2019-11-12 | Discharge: 2019-11-12 | Disposition: A | Discharge status: Home / Self Care | Payer: Medicaid Other | Source: Ambulatory Visit | Attending: Radiation Oncology | Admitting: Radiation Oncology

## 2019-11-12 ENCOUNTER — Other Ambulatory Visit: Payer: Self-pay

## 2019-11-12 DIAGNOSIS — C4339 Malignant melanoma of other parts of face: Secondary | ICD-10-CM | POA: Diagnosis not present

## 2019-11-12 MED ORDER — IOHEXOL 300 MG/ML  SOLN
75.0000 mL | Freq: Once | INTRAMUSCULAR | Status: AC | PRN
  Administered 2019-11-12: 75 mL via INTRAVENOUS

## 2019-11-12 NOTE — Telephone Encounter (Signed)
Called the patient to inform him that we needed to draw some blood tomorrow while he was here seeing the medical oncologist.  He informed me that he would not be here for that appointment because he had other doctors to see about his eyes.  He states to call early Monday morning to see about getting all of this rescheduled.  Natalie in Dr. Hazeline Junker office was notified and appointment was cancelled.  PA Shona Simpson notified of the lab cancellation.  Will continue to follow as necessary.  Gloriajean Dell. Leonie Green, BSN

## 2019-11-13 ENCOUNTER — Ambulatory Visit: Payer: Medicaid Other | Admitting: Physical Therapy

## 2019-11-13 ENCOUNTER — Inpatient Hospital Stay: Payer: Medicaid Other | Admitting: Oncology

## 2019-11-13 ENCOUNTER — Encounter: Payer: Self-pay | Admitting: Physical Therapy

## 2019-11-13 DIAGNOSIS — I890 Lymphedema, not elsewhere classified: Secondary | ICD-10-CM

## 2019-11-13 NOTE — Patient Instructions (Signed)
Sit in front of a mirror. Do 5 slow deep breaths, breathing in through the nose and out through the mouth, letting your belly "inflate" as you breathe in.  1) Place hands on areas just behind collar bones and do 10 stationary circles with stretch in outward directions. 2) Do stationary circles at each armpit about 10 times. 3) Place one hand on the front of the opposite shoulder and do stationary circles with stretch downward toward underarm. 4) Repeat #1 above. 5) Imagine a river runnning in a line from the earlobe straight down the neck.  Place hands just behind this and do circles with stretch coming forward slightly and down, thinking about fluid flowing down that river.  Do 10 times. 6) Place one hand just in front of the river on one side and do circles with a slight back and then downward stretch, thinking again about putting that fluid in the river.  Do 10-20 times on each side. 7) Place one hand just slightly in front of the spot you just did and do the same thing.  DO THIS VERY GENTLY. 8) Use the side of the fingers of your dominant hand to pump downward starting just under the chin and "stair stepping" downward with a stretch, working down to the chest. 9) Repeat #1. 10) Do stationary circles on each side of the face just above the chin, out and down with the stretch 10 times. 11) Do stationary circles on each side of the face on the cheeks with pressure going back and down, 10 times. 12) Do stationary circles on each side of the face between the eyes and ears, again back and downward 10 times. 13) Repeat steps 9,8,7,1,3 and 2 in that order!  Do not slide on the skin, but STRETCH it with your motions. Only give enough pressure to stretch the skin. Make sure to wash your hands prior to doing this. DO THIS SLOWLY, PLEASE!  And do once a day. 

## 2019-11-13 NOTE — Therapy (Signed)
[place], [place], [address] Phone: [phone]   Fax:  [phone]  Physical Therapy Treatment  Patient Details  Name: Gregory [name] MRN: [id number] Date of Birth: 1960/06/29 Referring Provider (PT): Shona Simpson    Encounter Date: 11/13/2019  PT End of Session - 11/13/19 1052    Visit Number  2    Number of Visits  12    Date for PT Re-Evaluation  01/04/20    Authorization Type  send new medicaid auth by 6/8 for 2x/wk for 4 wks    Authorization - Visit Number  1    Authorization - Number of Visits  3    PT Start Time  [phone]    PT Stop Time  1049    PT Time Calculation (min)  57 min    Activity Tolerance  Patient tolerated treatment well    Behavior During Therapy  [facility] for tasks assessed/performed       Past Medical History:  Diagnosis Date  . Acquired absence of left great toe ([place])   . Acquired absence of right leg below knee ([place])   . Acute osteomyelitis of calcaneum, right ([place]) 10/02/2016  . Acute respiratory failure ([place])   . Amputation stump infection ([place]) 08/12/2018  . Anemia   . Basal cell carcinoma, eyelid   . Cancer ([place])   . Candidiasis 08/12/2018  . CHF (congestive heart failure) (HCC)    chronic diastolic   . Chronic back pain   . Chronic kidney disease    stage 3   . Chronic multifocal osteomyelitis (HCC)    of ankle and foot   . Chronic pain syndrome   . COPD (chronic obstructive pulmonary disease) ([place])   . DDD (degenerative disc disease), lumbar   . Depression    major depressive disorder   . Diabetes mellitus without complication ([place])    type 2   . Diabetic ulcer of toe of left foot ([place]) 10/02/2016  . Difficulty in walking, not elsewhere classified   . 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 Foot drop, bilateral   . Foot drop, right 12/27/2015  . GERD (gastroesophageal reflux disease)   . Herpesviral vesicular dermatitis   . Hyperlipidemia   . Hyperlipidemia   .  Hypertension   . Insomnia   . Malignant melanoma of other parts of face ([place])   . MRSA bacteremia   . Muscle weakness (generalized)   . Nasal congestion   . Necrosis of toe ([place]) 07/08/2018  . Neuromuscular dysfunction of bladder   . Osteomyelitis ([place])   . Osteomyelitis ([place])   . Osteomyelitis ([place])    right BKA  . Other idiopathic peripheral autonomic neuropathy   . Retention of urine, unspecified   . Squamous cell carcinoma of skin   . Unsteadiness on feet   . Urinary retention   . Urinary retention   . Urinary tract infection   . Wears dentures   . Wears glasses     Past Surgical History:  Procedure Laterality Date  . AMPUTATION Left 03/29/2017   Procedure: LEFT GREAT TOE AMPUTATION AT METATARSOPHALANGEAL JOINT;  Surgeon: [name], MD;  Location: [place];  Service: Orthopedics;  Laterality: Left;  . AMPUTATION Right 03/29/2017   Procedure: RIGHT BELOW KNEE AMPUTATION;  Surgeon: [name], MD;  Location: [place];  Service: Orthopedics;  Laterality: Right;  . AMPUTATION Left 06/06/2018   Procedure: LEFT 2ND TOE AMPUTATION;  Surgeon: [name], MD;  Location: [place];  Service: Orthopedics;  Laterality: Left;  . AMPUTATION Right 11/19/2018   Procedure: AMPUTATION ABOVE KNEE;  Surgeon: [name], MD;  Location: [place];  Service: Orthopedics;  Laterality: Right;  . ANTERIOR CERVICAL CORPECTOMY N/A 11/25/2015   Procedure: ANTERIOR CERVICAL FIVE CORPECTOMY Cervical four - six fusion;  Surgeon: Consuella Lose, MD;  Location: [place] [facility];  Service: Neurosurgery;  Laterality: N/A;  ANTERIOR CERVICAL FIVE CORPECTOMY Cervical four - six fusion  . APPLICATION OF WOUND VAC Right 10/22/2018   Procedure: Application Of Wound Vac;  Surgeon: [name], MD;  Location: [place];  Service: Orthopedics;  Laterality: Right;  . CYSTOSCOPY WITH RETROGRADE URETHROGRAM N/A 04/25/2018   Procedure: CYSTOSCOPY WITH RETROGRADE URETHROGRAM/ BALLOON DILATION;  Surgeon: Kathie Rhodes, MD;   [facility];  Service: Urology;  Laterality: N/A;  . MULTIPLE TOOTH EXTRACTIONS    . POSTERIOR CERVICAL FUSION/FORAMINOTOMY N/A 11/29/2015   Procedure: Cervical Three-Cervical Seven Posterior Cervical Laminectomy with Fusion;  Surgeon: Consuella Lose, MD;  Location: [place] [facility];  Service: Neurosurgery;  Laterality: N/A;  Cervical Three-Cervical Seven Posterior Cervical Laminectomy with Fusion  . STUMP REVISION Right 10/22/2018   Procedure: REVISION RIGHT BELOW KNEE AMPUTATION;  Surgeon: [name], MD;  Location: [place];  Service: Orthopedics;  Laterality: Right;  . STUMP REVISION Right 12/05/2018   Procedure: Revision Right Above Knee Amputation;  Surgeon: [name], MD;  Location: [place];  Service: Orthopedics;  Laterality: Right;  . STUMP REVISION Right 05/29/2019   Procedure: REVISION RIGHT ABOVE KNEE AMPUTATION;  Surgeon: [name], MD;  Location: [place];  Service: Orthopedics;  Laterality: Right;  . STUMP REVISION Right 06/24/2019   Procedure: STUMP REVISION;  Surgeon: [name], MD;  Location: [place];  Service: Orthopedics;  Laterality: Right;    There were no vitals filed for this visit.  Subjective Assessment - 11/13/19 0956    Subjective  My eye bugs me. The swelling has been about the same.    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    Patient Stated Goals  to get his eye swelling down    Currently in Pain?  Yes    Pain Score  4     Pain Location  Leg    Pain Orientation  Right    Pain Descriptors / Indicators  Shooting    Pain Type  Chronic pain    Pain Onset  More than a month ago    Pain Frequency  Constant                        [facility] Adult PT  Treatment/Exercise - 11/13/19 0001      Manual Therapy   Manual Therapy  Manual Lymphatic Drainage (MLD)    Manual Lymphatic Drainage (MLD)  short neck, bilateral axillary nodes, bilateral shoulder collectors, short neck, posterior, lateral and anterior neck moving fluid towards pathway at lateral neck, then jaw and lower face moving fluid towards pathway then majority of time spent at upper face and around right eye moving fluid down face anterior to ear then down pathay on lateral neck then retracing all steps. Focused on area of edema and  fibrosis just superior to cheek scar and interior to cheek scar. Did not do massage over scar because it is still healing. Educated pt throughout and had him return demonstrate correct technique and sequence.              PT Education - 11/13/19 1052    Education Details  self MLD    Person(s) Educated  Patient    Methods  Explanation;Handout    Comprehension  Verbalized understanding;Returned demonstration       PT Short Term Goals - 11/03/19 1621      PT SHORT TERM GOAL #1   Title  Pt will be independent in self managment of periorbital lymphedema with self manual lymph drainage , compression and exercise    Baseline  no knowledge        PT Long Term Goals - 11/03/19 1610      PT LONG TERM GOAL #1   Title  Pt will iincrease right eye opening to 1.9 cm    Baseline  1.3 cm on 11/03/2019    Time  8    Period  Weeks    Status  New      PT LONG TERM GOAL #2   Title  --    Baseline  --    Time  --    Period  --    Status  --            Plan - 11/13/19 1053    Clinical Impression Statement  Began MLD today for R orbital area and instructed pt throughout and had him return demonstrate. Issued handout for pt to follow when doing self MLD. His scar on his right cheek is still healing so no scar massage performed today. Worked on draining area superior and inferior to scar on cheek.    Comorbidities  melanoma recurrance, right AKA  with several revisions, Left great and first toe amputation    PT Frequency  2x / week   1x/wk for 2 weeks then 2x/wk for 3 weeks   PT Duration  8 weeks    PT Next Visit Plan  teach Self MLD, try kinesiotape. ??? eye compression patch ??    Consulted and Agree with Plan of Care  Patient       Patient will benefit from skilled therapeutic intervention in order to improve the following deficits and impairments:  Increased edema, Decreased knowledge of use of DME, Decreased knowledge of precautions, Postural dysfunction  Visit Diagnosis: Lymphedema, not elsewhere classified     Problem List Patient Active Problem List   Diagnosis Date Noted  . Melanoma of cheek ([place]) 10/15/2019  . Acute respiratory failure with hypoxia ([place]) 07/11/2019  . Acute respiratory disease due to COVID-19 virus 07/11/2019  . Wound infection 06/22/2019  . Abscess 06/22/2019  . CKD (chronic kidney disease), stage III 05/23/2019  . Tobacco use 05/23/2019  . Sepsis ([place]) 05/23/2019  . S/P AKA (above knee amputation) unilateral, right ([place]) 12/05/2018  . Amputation stump infection ([place])   . Lymphedema of left leg   . Diabetic polyneuropathy associated with type 2 diabetes mellitus ([place])   . Above knee amputation of right lower extremity ([place]) 10/22/2018  . Dehiscence of amputation stump ([place])   . 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[place]) 08/12/2018  . Candidiasis 08/12/2018  . Necrosis of toe ([place]) 07/08/2018  . Amputated toe of left foot ([place]) 06/18/2018  . Streptococcal infection   . Pressure injury of skin 06/06/2018  . Cutaneous  abscess of left foot   . Moderate protein-calorie malnutrition ([place])   . Cellulitis of left leg   . Septic arthritis ([place]) 06/03/2018  . Idiopathic chronic venous hypertension of left lower extremity with ulcer and inflammation ([place]) 11/14/2017  . Great toe amputation status, left 11/14/2017  . Osteomyelitis ([place]) 03/25/2017  . Skin cancer 03/25/2017  . Depression,  recurrent ([place]) 11/05/2016  . Diabetic ulcer of toe of left foot ([place]) 10/02/2016  . 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 Chronic pain 09/27/2016  . Dyslipidemia associated with type 2 diabetes mellitus ([place]) 06/06/2016  . GERD without esophagitis 05/11/2016  . Hypertensive heart disease with congestive heart failure ([place]) 12/15/2015  . Spinal stenosis in cervical region 12/15/2015  . Type II diabetes mellitus with neurological manifestations ([place]) 12/15/2015  . Chronic diastolic CHF (congestive heart failure) ([place])   . Pulmonary hypertension ([place])   . Urinary retention   . Diskitis   . Foot drop, bilateral   . Sepsis with acute renal failure without septic shock ([place])   . MRSA bacteremia 10/03/2015  . COPD (chronic obstructive pulmonary disease) ([place]) 10/03/2015  . Acute osteomyelitis of left foot ([place]) 10/03/2015    Allyson Sabal [facility] 11/13/2019, 11:03 AM  [place], [place], [address] Phone: [phone]   Fax:  [phone]  Name: KEIDRICK PLACK MRN: [id number] Date of Birth: 08-30-1960  Manus Gunning, PT 11/13/19 11:03 AM

## 2019-11-17 ENCOUNTER — Telehealth: Payer: Self-pay | Admitting: Radiation Oncology

## 2019-11-17 NOTE — Telephone Encounter (Signed)
I called the patient and reviewed his CT scan results and rationale to proceed with XRT He has not decided yet about moving forward with XRT. He wants to meet with medical oncology first to discuss options. I had left a message for Dr. Hazeline Junker nurse to try to get him rescheduled.

## 2019-11-18 ENCOUNTER — Telehealth: Payer: Self-pay | Admitting: Oncology

## 2019-11-18 NOTE — Telephone Encounter (Signed)
Gregory [name] no showed for his appt with Dr. Alen Blew on 5/20. I cld and left the pt a vm to callback to reschedule his new patient appt.

## 2019-11-19 ENCOUNTER — Telehealth: Payer: Self-pay | Admitting: Oncology

## 2019-11-19 NOTE — Telephone Encounter (Signed)
Mr. Gregory [name] has been rescheduled to see Dr. Alen Blew on 6/8 at 2pm. The appointment date and time has been given to the pt's nurse [place] at [facility]. Awar for the pt to arrive 15 minutes early

## 2019-11-20 ENCOUNTER — Ambulatory Visit: Payer: Medicaid Other | Attending: Radiation Oncology | Admitting: Physical Therapy

## 2019-11-20 ENCOUNTER — Other Ambulatory Visit: Payer: Self-pay

## 2019-11-20 DIAGNOSIS — I890 Lymphedema, not elsewhere classified: Secondary | ICD-10-CM | POA: Diagnosis present

## 2019-11-20 DIAGNOSIS — R293 Abnormal posture: Secondary | ICD-10-CM | POA: Insufficient documentation

## 2019-11-20 NOTE — Therapy (Signed)
[place], [place], [address] Phone: [phone]   Fax:  [phone]  Physical Therapy Treatment  Patient Details  Name: Gregory [name] MRN: [id number] Date of Birth: 21-Nov-1960 Referring Provider (PT): Shona Simpson    Encounter Date: 11/20/2019  PT End of Session - 11/20/19 1011    Visit Number  3    Number of Visits  12    Date for PT Re-Evaluation  01/04/20    Authorization Type  send new medicaid auth by 6/8 for 2x/wk for 4 wks    Authorization - Visit Number  2    Authorization - Number of Visits  3    PT Start Time  1007    PT Stop Time  1055    PT Time Calculation (min)  48 min    Activity Tolerance  Patient tolerated treatment well    Behavior During Therapy  [facility] for tasks assessed/performed       Past Medical History:  Diagnosis Date  . Acquired absence of left great toe ([place])   . Acquired absence of right leg below knee ([place])   . Acute osteomyelitis of calcaneum, right ([place]) 10/02/2016  . Acute respiratory failure ([place])   . Amputation stump infection ([place]) 08/12/2018  . Anemia   . Basal cell carcinoma, eyelid   . Cancer ([place])   . Candidiasis 08/12/2018  . CHF (congestive heart failure) (HCC)    chronic diastolic   . Chronic back pain   . Chronic kidney disease    stage 3   . Chronic multifocal osteomyelitis (HCC)    of ankle and foot   . Chronic pain syndrome   . COPD (chronic obstructive pulmonary disease) ([place])   . DDD (degenerative disc disease), lumbar   . Depression    major depressive disorder   . Diabetes mellitus without complication ([place])    type 2   . Diabetic ulcer of toe of left foot ([place]) 10/02/2016  . Difficulty in walking, not elsewhere classified   . 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 Foot drop, bilateral   . Foot drop, right 12/27/2015  . GERD (gastroesophageal reflux disease)   . Herpesviral vesicular dermatitis   . Hyperlipidemia   . Hyperlipidemia   .  Hypertension   . Insomnia   . Malignant melanoma of other parts of face ([place])   . MRSA bacteremia   . Muscle weakness (generalized)   . Nasal congestion   . Necrosis of toe ([place]) 07/08/2018  . Neuromuscular dysfunction of bladder   . Osteomyelitis ([place])   . Osteomyelitis ([place])   . Osteomyelitis ([place])    right BKA  . Other idiopathic peripheral autonomic neuropathy   . Retention of urine, unspecified   . Squamous cell carcinoma of skin   . Unsteadiness on feet   . Urinary retention   . Urinary retention   . Urinary tract infection   . Wears dentures   . Wears glasses     Past Surgical History:  Procedure Laterality Date  . AMPUTATION Left 03/29/2017   Procedure: LEFT GREAT TOE AMPUTATION AT METATARSOPHALANGEAL JOINT;  Surgeon: [name], MD;  Location: [place];  Service: Orthopedics;  Laterality: Left;  . AMPUTATION Right 03/29/2017   Procedure: RIGHT BELOW KNEE AMPUTATION;  Surgeon: [name], MD;  Location: [place];  Service: Orthopedics;  Laterality: Right;  . AMPUTATION Left 06/06/2018   Procedure: LEFT 2ND TOE AMPUTATION;  Surgeon: [name], MD;  Location: [place];  Service: Orthopedics;  Laterality: Left;  . AMPUTATION Right 11/19/2018   Procedure: AMPUTATION ABOVE KNEE;  Surgeon: [name], MD;  Location: [place];  Service: Orthopedics;  Laterality: Right;  . ANTERIOR CERVICAL CORPECTOMY N/A 11/25/2015   Procedure: ANTERIOR CERVICAL FIVE CORPECTOMY Cervical four - six fusion;  Surgeon: Consuella Lose, MD;  Location: [place] [facility];  Service: Neurosurgery;  Laterality: N/A;  ANTERIOR CERVICAL FIVE CORPECTOMY Cervical four - six fusion  . APPLICATION OF WOUND VAC Right 10/22/2018   Procedure: Application Of Wound Vac;  Surgeon: [name], MD;  Location: [place];  Service: Orthopedics;  Laterality: Right;  . CYSTOSCOPY WITH RETROGRADE URETHROGRAM N/A 04/25/2018   Procedure: CYSTOSCOPY WITH RETROGRADE URETHROGRAM/ BALLOON DILATION;  Surgeon: Kathie Rhodes, MD;   [facility];  Service: Urology;  Laterality: N/A;  . MULTIPLE TOOTH EXTRACTIONS    . POSTERIOR CERVICAL FUSION/FORAMINOTOMY N/A 11/29/2015   Procedure: Cervical Three-Cervical Seven Posterior Cervical Laminectomy with Fusion;  Surgeon: Consuella Lose, MD;  Location: [place] [facility];  Service: Neurosurgery;  Laterality: N/A;  Cervical Three-Cervical Seven Posterior Cervical Laminectomy with Fusion  . STUMP REVISION Right 10/22/2018   Procedure: REVISION RIGHT BELOW KNEE AMPUTATION;  Surgeon: [name], MD;  Location: [place];  Service: Orthopedics;  Laterality: Right;  . STUMP REVISION Right 12/05/2018   Procedure: Revision Right Above Knee Amputation;  Surgeon: [name], MD;  Location: [place];  Service: Orthopedics;  Laterality: Right;  . STUMP REVISION Right 05/29/2019   Procedure: REVISION RIGHT ABOVE KNEE AMPUTATION;  Surgeon: [name], MD;  Location: [place];  Service: Orthopedics;  Laterality: Right;  . STUMP REVISION Right 06/24/2019   Procedure: STUMP REVISION;  Surgeon: [name], MD;  Location: [place];  Service: Orthopedics;  Laterality: Right;    There were no vitals filed for this visit.  Subjective Assessment - 11/20/19 1011    Subjective  Pt states that his eye is starting to effect your vision. Pt states that when he does the massage that his eye is swelling more.    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    Patient Stated Goals  to get his eye swelling down    Currently in Pain?  No/denies   Not in his face but has chronic pain in his leg.   Pain Score  0-No pain                        [facility] Adult PT Treatment/Exercise - 11/20/19 0001      Manual  Therapy   Manual Therapy  Manual Lymphatic Drainage (MLD)    Manual Lymphatic Drainage (MLD)  short neck, bilateral axillary nodes, bilateral shoulder collectors, short neck, posterior, lateral and anterior neck moving fluid towards pathway at lateral neck, then jaw and lower face moving fluid towards pathway then majority of time spent at upper face and around right eye moving fluid down face anterior to ear then down pathay on lateral neck then retracing all steps. Focused on area of edema and fibrosis just superior to cheek scar and interior to cheek scar. Did not do massage over scar because it is still healing. added  intermittent light pressure with just fingers over arae of edema and educated pt to perform this intermittently for 3-5 seconds throughout the day             PT Education - 11/20/19 1217    Education Details  Pt was taught to perform very light MLD and discussed very light intermittent compression under the eye when he thinks about it to help decrease fluid in this area.    Person(s) Educated  Patient    Methods  Explanation;Demonstration    Comprehension  Verbalized understanding;Returned demonstration       PT Short Term Goals - 11/03/19 1621      PT SHORT TERM GOAL #1   Title  Pt will be independent in self managment of periorbital lymphedema with self manual lymph drainage , compression and exercise    Baseline  no knowledge        PT Long Term Goals - 11/03/19 1610      PT LONG TERM GOAL #1   Title  Pt will iincrease right eye opening to 1.9 cm    Baseline  1.3 cm on 11/03/2019    Time  8    Period  Weeks    Status  New      PT LONG TERM GOAL #2   Title  --    Baseline  --    Time  --    Period  --    Status  --            Plan - 11/20/19 1010    Clinical Impression Statement  Pt presents with increased edema in the R inferior orbital area this session. He was educated on using very light strokes with MLD and always pushing the fluid down.  Discussed the anatomy and physiology of the lymphatic system with pt including how the lymph fluid is cleared from the body and the role of the deep lymphatics. Pt scar continues to not healed completely at this point so scar moblization was not performed and was avoided with MLD. Pt will benefit from continued POC at this time.    Personal Factors and Comorbidities  Comorbidity 3+    Comorbidities  melanoma recurrance, right AKA with several revisions, Left great and first toe amputation    Rehab Potential  Good    PT Frequency  2x / week    PT Duration  8 weeks    PT Treatment/Interventions  ADLs/Self Care Home Management;Therapeutic exercise;Patient/family education;Orthotic Fit/Training;Manual lymph drainage;Taping;Compression bandaging    PT Next Visit Plan  teach Self MLD, try kinesiotape. ??? eye compression patch ??    Consulted and Agree with Plan of Care  Patient       Patient will benefit from skilled therapeutic intervention in order to improve the following deficits and impairments:  Increased edema, Decreased knowledge of use of DME, Decreased knowledge of precautions, Postural dysfunction  Visit Diagnosis: Lymphedema, not elsewhere classified  Abnormal posture     Problem List Patient Active Problem List   Diagnosis Date Noted  . Melanoma of cheek ([place]) 10/15/2019  . Acute respiratory failure with hypoxia ([place]) 07/11/2019  . Acute respiratory disease due to COVID-19 virus 07/11/2019  . Wound infection 06/22/2019  . Abscess 06/22/2019  . CKD (chronic kidney disease), stage III 05/23/2019  . Tobacco use 05/23/2019  . Sepsis ([place]) 05/23/2019  . S/P AKA (above knee amputation) unilateral, right ([place]) 12/05/2018  . Amputation stump infection ([place])   . Lymphedema of left leg   .  Diabetic polyneuropathy associated with type 2 diabetes mellitus ([place])   . Above knee amputation of right lower extremity ([place]) 10/22/2018  . Dehiscence of amputation stump ([place])   . Chronic  infection of amputation stump ([place]) 08/12/2018  . Candidiasis 08/12/2018  . Necrosis of toe ([place]) 07/08/2018  . Amputated toe of left foot ([place]) 06/18/2018  . Streptococcal infection   . Pressure injury of skin 06/06/2018  . Cutaneous abscess of left foot   . Moderate protein-calorie malnutrition ([place])   . Cellulitis of left leg   . Septic arthritis ([place]) 06/03/2018  . Idiopathic chronic venous hypertension of left lower extremity with ulcer and inflammation ([place]) 11/14/2017  . Great toe amputation status, left 11/14/2017  . Osteomyelitis ([place]) 03/25/2017  . Skin cancer 03/25/2017  . Depression, recurrent ([place]) 11/05/2016  . Diabetic ulcer of toe of left foot ([place]) 10/02/2016  . 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 Chronic pain 09/27/2016  . Dyslipidemia associated with type 2 diabetes mellitus ([place]) 06/06/2016  . GERD without esophagitis 05/11/2016  . Hypertensive heart disease with congestive heart failure ([place]) 12/15/2015  . Spinal stenosis in cervical region 12/15/2015  . Type II diabetes mellitus with neurological manifestations ([place]) 12/15/2015  . Chronic diastolic CHF (congestive heart failure) ([place])   . Pulmonary hypertension ([place])   . Urinary retention   . Diskitis   . Foot drop, bilateral   . Sepsis with acute renal failure without septic shock ([place])   . MRSA bacteremia 10/03/2015  . COPD (chronic obstructive pulmonary disease) ([place]) 10/03/2015  . Acute osteomyelitis of left foot ([place]) 10/03/2015    Ander Purpura, PT 11/20/2019, 12:28 PM  [place], [place], [address] Phone: [phone]   Fax:  [phone]  Name: Gregory [name] MRN: [id number] Date of Birth: Aug 19, 1960

## 2019-11-24 ENCOUNTER — Inpatient Hospital Stay: Payer: Medicaid Other | Attending: Oncology | Admitting: Oncology

## 2019-11-24 ENCOUNTER — Other Ambulatory Visit: Payer: Self-pay

## 2019-11-24 VITALS — BP 146/71 | HR 69 | Temp 98.2°F | Resp 18 | Ht 72.0 in | Wt 230.0 lb

## 2019-11-24 DIAGNOSIS — I129 Hypertensive chronic kidney disease with stage 1 through stage 4 chronic kidney disease, or unspecified chronic kidney disease: Secondary | ICD-10-CM

## 2019-11-24 DIAGNOSIS — C4339 Malignant melanoma of other parts of face: Secondary | ICD-10-CM

## 2019-11-24 DIAGNOSIS — F17210 Nicotine dependence, cigarettes, uncomplicated: Secondary | ICD-10-CM | POA: Diagnosis not present

## 2019-11-24 DIAGNOSIS — N183 Chronic kidney disease, stage 3 unspecified: Secondary | ICD-10-CM

## 2019-11-24 DIAGNOSIS — E1122 Type 2 diabetes mellitus with diabetic chronic kidney disease: Secondary | ICD-10-CM

## 2019-11-24 DIAGNOSIS — I2510 Atherosclerotic heart disease of native coronary artery without angina pectoris: Secondary | ICD-10-CM

## 2019-11-24 NOTE — Progress Notes (Signed)
Reason for the request:   Melanoma   HPI: I was asked by Dr. Lisbeth Renshaw to evaluate Gregory [name] for diagnosis of melanoma.  He is a 59 year old man with a history of coronary artery disease, hypertension as well as osteomyelitis who was diagnosed with melanoma of the right eyebrow in 2018.  At that time he was found to have stage IIb disease that was surgically resected by Dr. Crisoforo [facility] at [facility].  He was noted to have a lesion on his right cheek in December 2020.  Biopsy obtained at that time which showed melanoma consistent with likely in-transit metastasis.  He underwent repeat surgical excision on August 24, 2019.  The final pathology showed a 2.8 cm neoplasm with positive margins.  The tumor showed spindled to epithelioid melanocyte with areas of necrosis and nuclear atypia and frequent mitotic figures.  Staging work-up at that time did not reveal any evidence of metastatic disease.  He was evaluated by Dr. Lisbeth Renshaw for radiation therapy given his positive margins.  Imaging studies including soft tissue neck and CT of the maxillofacial on 11/12/2019 did not show any evidence of metastatic disease.  Clinically, he reports no major complaints at this time.  He does report facial swelling and has been receiving physical therapy for facial lymphedema.  He does report chronic right leg pain related to his previous surgery and amputation.  He does not report any headaches, blurry vision, syncope or seizures. Does not report any fevers, chills or sweats.  Does not report any cough, wheezing or hemoptysis.  Does not report any chest pain, palpitation, orthopnea or leg edema.  Does not report any nausea, vomiting or abdominal pain.  Does not report any constipation or diarrhea.  Does not report any skeletal complaints.    Does not report frequency, urgency or hematuria.  Does not report any skin rashes or lesions. Does not report any heat or cold intolerance.  Does not report any lymphadenopathy or  petechiae.  Does not report any anxiety or depression.  Remaining review of systems is negative.    Past Medical History:  Diagnosis Date  . Acquired absence of left great toe ([place])   . Acquired absence of right leg below knee ([place])   . Acute osteomyelitis of calcaneum, right ([name]) 10/02/2016  . Acute respiratory failure ([place])   . Amputation stump infection ([place]) 08/12/2018  . Anemia   . Basal cell carcinoma, eyelid   . Cancer ([place])   . Candidiasis 08/12/2018  . CHF (congestive heart failure) (H[facility])    chronic diastolic   . Chronic back pain   . Chronic kidney disease    stage 3   . Chronic multifocal osteomyelitis (H[facility])    of ankle and foot   . Chronic pain syndrome   . COPD (chronic obstructive pulmonary disease) ([place])   . DDD (degenerative disc disease), lumbar   . Depression    major depressive disorder   . Diabetes mellitus without complication ([place])    type 2   . Diabetic ulcer of toe of left foot ([place]) 10/02/2016  . Difficulty in walking, not elsewhere classified   . 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 Foot drop, bilateral   . Foot drop, right 12/27/2015  . GERD (gastroesophageal reflux disease)   . Herpesviral vesicular dermatitis   . Hyperlipidemia   . Hyperlipidemia   . Hypertension   . Insomnia   . Malignant melanoma of other parts of face ([place])   . MRSA bacteremia   .  Muscle weakness (generalized)   . Nasal congestion   . Necrosis of toe ([place]) 07/08/2018  . Neuromuscular dysfunction of bladder   . Osteomyelitis ([place])   . Osteomyelitis ([place])   . Osteomyelitis ([place])    right BKA  . Other idiopathic peripheral autonomic neuropathy   . Retention of urine, unspecified   . Squamous cell carcinoma of skin   . Unsteadiness on feet   . Urinary retention   . Urinary retention   . Urinary tract infection   . Wears dentures   . Wears glasses   :  Past Surgical History:  Procedure Laterality Date  . AMPUTATION Left 03/29/2017   Procedure: LEFT GREAT TOE AMPUTATION AT  METATARSOPHALANGEAL JOINT;  Surgeon: [name], MD;  Location: [place];  Service: Orthopedics;  Laterality: Left;  . AMPUTATION Right 03/29/2017   Procedure: RIGHT BELOW KNEE AMPUTATION;  Surgeon: [name], MD;  Location: [place];  Service: Orthopedics;  Laterality: Right;  . AMPUTATION Left 06/06/2018   Procedure: LEFT 2ND TOE AMPUTATION;  Surgeon: [name], MD;  Location: [place];  Service: Orthopedics;  Laterality: Left;  . AMPUTATION Right 11/19/2018   Procedure: AMPUTATION ABOVE KNEE;  Surgeon: [name], MD;  Location: [place];  Service: Orthopedics;  Laterality: Right;  . ANTERIOR CERVICAL CORPECTOMY N/A 11/25/2015   Procedure: ANTERIOR CERVICAL FIVE CORPECTOMY Cervical four - six fusion;  Surgeon: Consuella Lose, MD;  Location: [facility];  Service: Neurosurgery;  Laterality: N/A;  ANTERIOR CERVICAL FIVE CORPECTOMY Cervical four - six fusion  . APPLICATION OF WOUND VAC Right 10/22/2018   Procedure: Application Of Wound Vac;  Surgeon: [name], MD;  Location: [place];  Service: Orthopedics;  Laterality: Right;  . CYSTOSCOPY WITH RETROGRADE URETHROGRAM N/A 04/25/2018   Procedure: CYSTOSCOPY WITH RETROGRADE URETHROGRAM/ BALLOON DILATION;  Surgeon: Kathie Rhodes, MD;  Location: [facility];  Service: Urology;  Laterality: N/A;  . MULTIPLE TOOTH EXTRACTIONS    . POSTERIOR CERVICAL FUSION/FORAMINOTOMY N/A 11/29/2015   Procedure: Cervical Three-Cervical Seven Posterior Cervical Laminectomy with Fusion;  Surgeon: Consuella Lose, MD;  Location: [place] [facility];  Service: Neurosurgery;  Laterality: N/A;  Cervical Three-Cervical Seven Posterior Cervical Laminectomy with Fusion  . STUMP REVISION Right 10/22/2018   Procedure: REVISION RIGHT BELOW KNEE AMPUTATION;  Surgeon: [name], MD;  Location: [place];  Service: Orthopedics;  Laterality: Right;  . STUMP REVISION Right 12/05/2018   Procedure: Revision Right Above Knee Amputation;  Surgeon: [name], MD;  Location: [place];  Service:  Orthopedics;  Laterality: Right;  . STUMP REVISION Right 05/29/2019   Procedure: REVISION RIGHT ABOVE KNEE AMPUTATION;  Surgeon: [name], MD;  Location: [place];  Service: Orthopedics;  Laterality: Right;  . STUMP REVISION Right 06/24/2019   Procedure: STUMP REVISION;  Surgeon: [name], MD;  Location: [place];  Service: Orthopedics;  Laterality: Right;  :   Current Outpatient Medications:  .  acetaminophen (TYLENOL) 325 MG tablet, Take 650 mg by mouth every 4 (four) hours as needed (general discomfort). , Disp: , Rfl:  .  albuterol (PROVENTIL HFA;VENTOLIN HFA) 108 (90 Base) MCG/ACT inhaler, Inhale 2 puffs into the lungs every 6 (six) hours as needed for wheezing or shortness of breath., Disp: , Rfl:  .  alum & mag hydroxide-simeth (MAALOX PLUS) 400-400-40 MG/5ML suspension, Take by mouth., Disp: , Rfl:  .  amLODipine (NORVASC) 10 MG tablet, Take 10 mg by mouth daily. for HTN, Disp: , Rfl:  .  Baclofen 5 MG TABS, Take 5 mg by mouth 3 (three) times daily. , Disp: , Rfl:  .  buPROPion (WELLBUTRIN XL) 300 MG 24 hr tablet, Take 300 mg by mouth at bedtime., Disp: , Rfl:  .  cetirizine (ZYRTEC) 10 MG tablet, Take 10 mg by mouth daily., Disp: , Rfl:  .  Cholecalciferol (VITAMIN D) 50 MCG (2000 UT) tablet, Take 2,000 Units by mouth daily., Disp: , Rfl:  .  Cholecalciferol (VITAMIN D-3) 125 MCG (5000 UT) TABS, Take 1 tablet by mouth daily., Disp: , Rfl:  .  Cholecalciferol (VITAMIN D-3) 5000 units TABS, Take 5,000 Units by mouth daily., Disp: , Rfl:  .  diphenhydrAMINE (SOMINEX) 25 MG tablet, Take by mouth., Disp: , Rfl:  .  docusate sodium (COLACE) 100 MG capsule, Take 1 capsule (100 mg total) by mouth 2 (two) times daily., Disp: 10 capsule, Rfl: 0 .  eszopiclone (LUNESTA) 1 MG TABS tablet, Take 1 mg by mouth at bedtime. Take immediately before bedtime, Disp: , Rfl:  .  ferrous sulfate 325 (65 FE) MG tablet, Take 1 tablet (325 mg total) by mouth daily with breakfast., Disp: 30 tablet, Rfl: 0 .   finasteride (PROSCAR) 5 MG tablet, Take 5 mg by mouth daily., Disp: , Rfl:  .  furosemide (LASIX) 40 MG tablet, Take by mouth., Disp: , Rfl:  .  gabapentin (NEURONTIN) 100 MG capsule, Take by mouth., Disp: , Rfl:  .  glipiZIDE (GLUCOTROL) 10 MG tablet, Take 10 mg by mouth daily before breakfast., Disp: , Rfl:  .  ibuprofen (ADVIL) 800 MG tablet, Take 800 mg by mouth 3 (three) times daily., Disp: , Rfl:  .  insulin aspart (NOVOLOG) 100 UNIT/ML injection, Inject 0-9 Units into the skin 3 (three) times daily with meals., Disp: 8.1 mL, Rfl: 0 .  insulin glargine (LANTUS) 100 UNIT/ML injection, Inject into the skin., Disp: , Rfl:  .  insulin lispro (HUMALOG KWIKPEN) 100 UNIT/ML KwikPen, Inject 16 Units into the skin 3 (three) times daily before meals. , Disp: , Rfl:  .  lidocaine (LIDODERM) 5 %, Place 1 patch onto the skin daily. Apply to Right stump. Remove & Discard patch within 12 hours or as directed by MD, Disp: , Rfl:  .  LYRICA 300 MG capsule, Take 300 mg by mouth 2 (two) times daily., Disp: , Rfl:  .  magnesium oxide (MAG-OX) 400 MG tablet, Take 400 mg by mouth 2 (two) times daily., Disp: , Rfl:  .  metoprolol tartrate (LOPRESSOR) 25 MG tablet, Take 0.5 tablets (12.5 mg total) by mouth 2 (two) times daily., Disp: 30 tablet, Rfl: 0 .  mirabegron ER (MYRBETRIQ) 50 MG TB24 tablet, Take 50 mg by mouth daily., Disp: , Rfl:  .  mometasone (NASONEX) 50 MCG/ACT nasal spray, Place 2 sprays into the nose at bedtime. , Disp: , Rfl:  .  naloxone (NARCAN) 4 MG/0.1ML LIQD nasal spray kit, Place into the nose., Disp: , Rfl:  .  nicotine (NICODERM CQ - DOSED IN MG/24 HOURS) 14 mg/24hr patch, Place 1 patch (14 mg total) onto the skin at bedtime., Disp: 28 patch, Rfl: 0 .  omeprazole (PRILOSEC) 20 MG capsule, Take 40 mg by mouth daily before breakfast. , Disp: , Rfl:  .  ondansetron (ZOFRAN) 4 MG tablet, Take by mouth., Disp: , Rfl:  .  oxybutynin (DITROPAN-XL) 10 MG 24 hr tablet, Take 10 mg by mouth at bedtime.,  Disp: , Rfl:  .  oxycodone (ROXICODONE) 30 MG immediate  release tablet, Take by mouth., Disp: , Rfl:  .  PARoxetine (PAXIL) 10 MG tablet, Take 10 mg by mouth at bedtime. , Disp: , Rfl:  .  promethazine (PHENERGAN) 25 MG tablet, Take by mouth., Disp: , Rfl:  .  rosuvastatin (CRESTOR) 10 MG tablet, Take 10 mg by mouth at bedtime., Disp: , Rfl:  .  senna-docusate (SENOKOT-S) 8.6-50 MG tablet, Take 2 tablets by mouth at bedtime., Disp: , Rfl:  .  tamsulosin (FLOMAX) 0.4 MG CAPS capsule, Take 0.4 mg by mouth every evening. , Disp: , Rfl:  .  terbinafine (LAMISIL) 250 MG tablet, Take 250 mg by mouth daily., Disp: , Rfl:  .  Tiotropium Bromide Monohydrate (SPIRIVA RESPIMAT) 1.25 MCG/ACT AERS, Inhale 2 puffs into the lungs daily., Disp: , Rfl:  .  vitamin B-12 (CYANOCOBALAMIN) 500 MCG tablet, Take 1,000 mcg by mouth daily. , Disp: , Rfl:  .  vitamin C (ASCORBIC ACID) 250 MG tablet, Take 500 mg by mouth 2 (two) times daily., Disp: , Rfl:  .  zinc sulfate (ZINC-220) 220 (50 Zn) MG capsule, Take 220 mg by mouth daily., Disp: , Rfl: :  No Active Allergies:  Family History  Problem Relation Age of Onset  . Cancer Other   . Diabetes Other   . Bone cancer Father   . Prostate cancer Father   . Cancer Paternal Grandfather   :  Social History   Socioeconomic History  . Marital status: Single    Spouse name: Not on file  . Number of children: Not on file  . Years of education: Not on file  . Highest education level: Not on file  Occupational History  . Not on file  Tobacco Use  . Smoking status: Current Every Day Smoker    Packs/day: 0.50    Types: Cigarettes  . Smokeless tobacco: Never Used  . Tobacco comment: 11/19/18  Substance and Sexual Activity  . Alcohol use: No  . Drug use: No  . Sexual activity: Not on file  Other Topics Concern  . Not on file  Social History Narrative  . Not on file   Social Determinants of Health   Financial Resource Strain:   . Difficulty of Paying Living  Expenses:   Food Insecurity:   . Worried About Charity fundraiser in the Last Year:   . Arboriculturist in the Last Year:   Transportation Needs:   . Film/video editor (Medical):   Marland Kitchen Lack of Transportation (Non-Medical):   Physical Activity:   . Days of Exercise per Week:   . Minutes of Exercise per Session:   Stress:   . Feeling of Stress :   Social Connections:   . Frequency of Communication with Friends and Family:   . Frequency of Social Gatherings with Friends and Family:   . Attends Religious Services:   . Active Member of Clubs or Organizations:   . Attends Archivist Meetings:   Marland Kitchen Marital Status:   Intimate Partner Violence:   . Fear of Current or Ex-Partner:   . Emotionally Abused:   Marland Kitchen Physically Abused:   . Sexually Abused:   :  Pertinent items are noted in HPI.  Exam: Blood pressure (!) 146/71, pulse 69, temperature 98.2 F (36.8 C), temperature source Temporal, resp. rate 18, height 6' (1.829 m), weight 230 lb (104.3 kg), SpO2 97 %.   ECOG 1 General appearance: alert and cooperative appeared without distress. Head: atraumatic without any abnormalities. Eyes:  conjunctivae/corneas clear. PERRL.  Sclera anicteric. Throat: lips, mucosa, and tongue normal; without oral thrush or ulcers. Resp: clear to auscultation bilaterally without rhonchi, wheezes or dullness to percussion. Cardio: regular rate and rhythm, S1, S2 normal, no murmur, click, rub or gallop GI: soft, non-tender; bowel sounds normal; no masses,  no organomegaly Skin: Skin color, texture, turgor normal. No rashes or lesions Lymph nodes: Cervical, supraclavicular, and axillary nodes normal. Neurologic: Grossly normal without any motor, sensory or deep tendon reflexes. Musculoskeletal: Right below the knee amputation noted.    CT Soft Tissue Neck W Contrast  Result Date: 11/13/2019 CLINICAL DATA:  59 year old male with melanoma of the right eyebrow. Recurrence at the right cheek  recently excised. EXAM: CT NECK WITH CONTRAST TECHNIQUE: Multidetector CT imaging of the neck was performed using the standard protocol following the bolus administration of intravenous contrast. CONTRAST:  70m OMNIPAQUE IOHEXOL 300 MG/ML SOLN in conjunction with contrast enhanced imaging of the face reported separately. COMPARISON:  Face CT today reported separately. Brain MRI 04/15/2012. Cervical spine MRI 11/11/2015. FINDINGS: Pharynx and larynx: Laryngeal and pharyngeal soft tissue contours are within normal limits. Negative parapharyngeal and retropharyngeal spaces. Salivary glands: Negative sublingual space. Submandibular glands appear symmetric and within normal limits. There is a small area of right side accessory parotid tissue overlying the masseter muscle on series 3, image 23. and there is also a linear area of what appears to be postoperative scarring extending from the skin surface to the superficial right parotid gland on series 3, image 17. No right parotid space lymphadenopathy. The parotid glands otherwise appear symmetric and normal. Thyroid: Negative; streak artifact in the region from cervical spine hardware. Lymph nodes: No cervical lymphadenopathy. Bilateral lower lung and level 2 lymph nodes appear symmetric and within normal limits. No enlarged or heterogeneous nodes. Masslike soft tissue along the inferior preseptal and premalar spaces at the right orbit (series 3, image 13), described on the face CT separately today. Vascular: Major vascular structures in the neck and at the skull base appear patent. Calcified atherosclerosis of the proximal right ICA, and the vessels at the skull base. Limited intracranial: Negative, further reported separately on the face CT today. Visualized orbits: See face CT today. Mastoids and visualized paranasal sinuses: Reported on face CT today. Skeleton: Extensive postoperative changes to the cervical spine with both anterior and posterior fusion hardware.  Previous C5 corpectomy. Multilevel posterior decompression. No acute or suspicious osseous lesion identified in the neck. Upper chest: Negative visible superior mediastinum. Negative lung apices; minor dependent atelectasis or scarring on the left. IMPRESSION: 1. See also Face CT today reported separately. 2. No lymphadenopathy or metastatic disease identified in the neck. Mild postoperative appearing changes to the right parotid gland. 3. Extensive prior decompression and fusion of the cervical spine. Electronically Signed   By: HGenevie AnnM.D.   On: 11/13/2019 12:36   CT MAXILLOFACIAL W CONTRAST  Result Date: 11/13/2019 CLINICAL DATA:  59year old male with melanoma of the right eyebrow. Recurrence at the right cheek recently excised. EXAM: CT MAXILLOFACIAL WITH CONTRAST TECHNIQUE: Multidetector CT imaging of the maxillofacial structures was performed with intravenous contrast. Multiplanar CT image reconstructions were also generated. CONTRAST:  741mOMNIPAQUE IOHEXOL 300 MG/ML  SOLN COMPARISON:  Neck CT today reported separately. Brain MRI 04/15/2012. Head CT 11/07/2015. FINDINGS: Osseous: Absent maxillary dentition. Mandible is intact and normally located. Partially visible anterior and posterior fusion changes to the cervical spine. Abnormal soft tissue along the anterior wall of the right maxillary sinus (  see below), with no underlying bone erosion or abnormality. Nasal bones, left maxilla, bilateral zygoma, and central skull base appear intact. There is a stable small sclerotic focus in the left clivus since 2017 which appears benign (series 4, image 29). No suspicious lesion of the visible calvarium. Orbits: Bony walls of the orbits appear intact. The left orbits soft tissues appear normal. There is widespread right side preseptal soft tissue thickening up to 10 mm (series 3, image 27). This is primarily along the inferior orbit, and is contiguous with a roughly 2 cm area of nodular soft tissue thickening  in the right premalar space with adjacent surgical clips (series 3, image 36). All told the area is about 36 mm in length craniocaudal (sagittal series 6, image 31). But furthermore, there is also a slightly more medial and inferior area of skin and immediate subjacent subcutaneous thickening on series 3, image 41. Such that when combined with the above the abnormal soft tissues encompass 42 mm cc (series 6, image 35). The underlying right globe appears to remain intact. And there is no postseptal orbital mass or inflammation identified. Superior to the orbit an area of soft tissue deficiency is probably from the initial melanoma resection (series 3, image 11). Sinuses: Paranasal sinuses appears stable and well pneumatized. There is a small retention cyst or area of mucosal thickening along the anterior left maxillary alveolar recess. Tympanic cavities remain clear. Mild left mastoid effusion is stable since 2017. Soft tissues: Right orbit and superficial face soft tissues described above. Soft tissue spaces of the neck are reported separately today. There is no deep soft tissue space abnormality of the face identified. The major vascular structures in the face and at the skull base appear patent. There is distal carotid and vertebral artery calcified atherosclerosis. Limited intracranial: Visible brain parenchyma appears stable compared to 2017 and within normal limits. IMPRESSION: 1. Right orbit inferior preseptal space and contiguous right premalar space mass-like soft tissue thickening, encompassing about 4 cm as seen on sagittal series 6, image 35. 2. No underlying bone involvement. No postseptal orbital involvement. 3. No metastatic disease identified in the face or visible brain. See also Neck CT reported separately today. Electronically Signed   By: Genevie Ann M.D.   On: 11/13/2019 12:29    Assessment and Plan:   59 year old man with:  1.  Melanoma of the right face diagnosed in 2018.  He was found to  have stage IIb disease that was surgically resected.  He developed in transit metastasis to the right cheek that was surgically resected in March 2021.  The final pathology showed positive margins with epithelioid to spindle cell melanocytes.  Staging imaging studies did not show any evidence of distant metastasis.  He is currently under evaluation to start radiation therapy given his positive margins.  The role of systemic therapy in the form of Pembrolizumab or Opdivo were discussed.  The indications for this treatments were reviewed at this time.  He has no evidence of measurable metastatic disease at this time and the treatment will be form of adjuvant course on a monthly basis to complete a year.  Complication associated with this therapy were discussed including immune mediated complications, on fatigue and skin rash.  After discussion today, I feel that local therapy is recommended at this time including radiation therapy and deferring systemic therapy upon completing local treatment.  I recommended repeating PET scan in the next 3 months and repeat evaluation at that time.  If he has  no evidence of metastatic disease at that time we will consider adjuvant systemic therapy versus continued active surveillance.  2.  Follow-up: Will be in 3 months for repeat follow-up after imaging studies.   60  minutes were dedicated to this visit. The time was spent on reviewing laboratory data, imaging studies, discussing treatment options, and answering questions regarding future plan.    Thank you for the referral.  I had the pleasure of meeting this patient today.  A copy of this consult has been forwarded to the requesting physician.

## 2019-11-25 ENCOUNTER — Telehealth: Payer: Self-pay | Admitting: Oncology

## 2019-11-25 NOTE — Telephone Encounter (Signed)
Scheduled appt 6/8 los.  Printed and mailed appt calendar.

## 2019-11-27 ENCOUNTER — Encounter: Payer: Medicaid Other | Admitting: Physical Therapy

## 2019-11-27 ENCOUNTER — Telehealth: Payer: Self-pay | Admitting: Physical Therapy

## 2019-11-27 NOTE — Telephone Encounter (Signed)
Called pt to let him know that Medicaid authorization is pending, so his appointment was cancelled today. Discussed with patient FlexiTouch compression pump for management of eye lymphedema. He should be hearing from pump company soon. Also educated pt that Medicaid denied his compression garment and that he could use a hackey sack over his eye to provide compression.  Pt requested therapist call his daughter, Joellen [place], and give her this information as well. Allyson Sabal [place], [place] 11/27/19 9:12 AM

## 2019-12-07 ENCOUNTER — Ambulatory Visit: Payer: Medicaid Other

## 2019-12-07 ENCOUNTER — Other Ambulatory Visit: Payer: Self-pay

## 2019-12-07 DIAGNOSIS — R293 Abnormal posture: Secondary | ICD-10-CM

## 2019-12-07 DIAGNOSIS — I890 Lymphedema, not elsewhere classified: Secondary | ICD-10-CM

## 2019-12-07 NOTE — Therapy (Signed)
[place], [place], [address] Phone: [phone]   Fax:  [phone]  Physical Therapy Treatment  Patient Details  Name: Gregory [name] MRN: [id number] Date of Birth: Aug 10, 1960 Referring Provider (PT): Shona Simpson    Encounter Date: 12/07/2019   PT End of Session - 12/07/19 1651    Visit Number 3    Number of Visits 12    Date for PT Re-Evaluation 01/04/20    Authorization Type send new medicaid auth by 6/8 for 2x/wk for 4 wks; new auth 6/17-7/14/21 for 8 visits    Authorization - Visit Number 1    Authorization - Number of Visits 8    PT Start Time 9924    PT Stop Time 1501    PT Time Calculation (min) 54 min    Activity Tolerance Patient tolerated treatment well    Behavior During Therapy [facility] for tasks assessed/performed           Past Medical History:  Diagnosis Date  . Acquired absence of left great toe ([place])   . Acquired absence of right leg below knee ([place])   . Acute osteomyelitis of calcaneum, right ([place]) 10/02/2016  . Acute respiratory failure ([place])   . Amputation stump infection ([place]) 08/12/2018  . Anemia   . Basal cell carcinoma, eyelid   . Cancer ([place])   . Candidiasis 08/12/2018  . CHF (congestive heart failure) (HCC)    chronic diastolic   . Chronic back pain   . Chronic kidney disease    stage 3   . Chronic multifocal osteomyelitis (HCC)    of ankle and foot   . Chronic pain syndrome   . COPD (chronic obstructive pulmonary disease) ([place])   . DDD (degenerative disc disease), lumbar   . Depression    major depressive disorder   . Diabetes mellitus without complication ([place])    type 2   . Diabetic ulcer of toe of left foot ([place]) 10/02/2016  . Difficulty in walking, not elsewhere classified   . 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 Foot drop, bilateral   . Foot drop, right 12/27/2015  . GERD (gastroesophageal reflux disease)   . Herpesviral vesicular dermatitis   .  Hyperlipidemia   . Hyperlipidemia   . Hypertension   . Insomnia   . Malignant melanoma of other parts of face ([place])   . MRSA bacteremia   . Muscle weakness (generalized)   . Nasal congestion   . Necrosis of toe ([place]) 07/08/2018  . Neuromuscular dysfunction of bladder   . Osteomyelitis ([place])   . Osteomyelitis ([place])   . Osteomyelitis ([place])    right BKA  . Other idiopathic peripheral autonomic neuropathy   . Retention of urine, unspecified   . Squamous cell carcinoma of skin   . Unsteadiness on feet   . Urinary retention   . Urinary retention   . Urinary tract infection   . Wears dentures   . Wears glasses     Past Surgical History:  Procedure Laterality Date  . AMPUTATION Left 03/29/2017   Procedure: LEFT GREAT TOE AMPUTATION AT METATARSOPHALANGEAL JOINT;  Surgeon: [name], MD;  Location: [place];  Service: Orthopedics;  Laterality: Left;  . AMPUTATION Right 03/29/2017   Procedure: RIGHT BELOW KNEE AMPUTATION;  Surgeon: [name], MD;  Location: [place];  Service: Orthopedics;  Laterality: Right;  . AMPUTATION Left 06/06/2018   Procedure: LEFT 2ND TOE AMPUTATION;  Surgeon: [name], MD;  Location: [place];  Service: Orthopedics;  Laterality: Left;  . AMPUTATION Right 11/19/2018   Procedure: AMPUTATION ABOVE KNEE;  Surgeon: [name], MD;  Location: [place];  Service: Orthopedics;  Laterality: Right;  . ANTERIOR CERVICAL CORPECTOMY N/A 11/25/2015   Procedure: ANTERIOR CERVICAL FIVE CORPECTOMY Cervical four - six fusion;  Surgeon: Consuella Lose, MD;  Location: [place] [facility];  Service: Neurosurgery;  Laterality: N/A;  ANTERIOR CERVICAL FIVE CORPECTOMY Cervical four - six fusion  . APPLICATION OF WOUND VAC Right 10/22/2018   Procedure: Application Of Wound Vac;  Surgeon: [name], MD;  Location: [place];  Service: Orthopedics;  Laterality: Right;  . CYSTOSCOPY WITH RETROGRADE URETHROGRAM N/A 04/25/2018   Procedure: CYSTOSCOPY WITH RETROGRADE URETHROGRAM/ BALLOON  DILATION;  Surgeon: Kathie Rhodes, MD;  [facility];  Service: Urology;  Laterality: N/A;  . MULTIPLE TOOTH EXTRACTIONS    . POSTERIOR CERVICAL FUSION/FORAMINOTOMY N/A 11/29/2015   Procedure: Cervical Three-Cervical Seven Posterior Cervical Laminectomy with Fusion;  Surgeon: Consuella Lose, MD;  Location: [place] [facility];  Service: Neurosurgery;  Laterality: N/A;  Cervical Three-Cervical Seven Posterior Cervical Laminectomy with Fusion  . STUMP REVISION Right 10/22/2018   Procedure: REVISION RIGHT BELOW KNEE AMPUTATION;  Surgeon: [name], MD;  Location: [place];  Service: Orthopedics;  Laterality: Right;  . STUMP REVISION Right 12/05/2018   Procedure: Revision Right Above Knee Amputation;  Surgeon: [name], MD;  Location: [place];  Service: Orthopedics;  Laterality: Right;  . STUMP REVISION Right 05/29/2019   Procedure: REVISION RIGHT ABOVE KNEE AMPUTATION;  Surgeon: [name], MD;  Location: [place];  Service: Orthopedics;  Laterality: Right;  . STUMP REVISION Right 06/24/2019   Procedure: STUMP REVISION;  Surgeon: [name], MD;  Location: [place];  Service: Orthopedics;  Laterality: Right;    There were no vitals filed for this visit.   Subjective Assessment - 12/07/19 1413    Subjective Vicente Males came to my house Friday to talk about the compression pump and he said that processing is rolling now. And I can tell my eye is more swollen today. My daughter hasn't gotten a hold of a hackey sack yet.    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    Patient Stated Goals to get his eye swelling down    Currently in Pain? No/denies   chronic leg pain, but no pain in face                             [facility] Adult PT Treatment/Exercise - 12/07/19 0001      Manual Therapy   Manual Therapy Manual Lymphatic Drainage (MLD)    Manual Lymphatic Drainage (MLD) Pt seated in power WC for duration of session: short neck, bilateral axillary nodes, bilateral shoulder collectors, 5 diaphragamtic breaths, posterior, lateral and anterior neck moving fluid towards pathway at lateral neck, then jaw and lower face moving fluid towards pathway then majority of time spent at upper face and around right eye moving fluid down face anterior to ear then down pathay on lateral neck then retracing all steps. Focused on area of edema and fibrosis just superior to cheek scar and interior  to cheek scar. Did not do massage over scar because it is still healing. added intermittent light pressure with just fingers over arae of edema                  PT Education - 12/07/19 1758    Education Details Issued information to pt for how to order Solaris Versi-Face Swell Spot    Person(s) Educated Patient    Methods Explanation;Handout    Comprehension Verbalized understanding            PT Short Term Goals - 11/03/19 1621      PT SHORT TERM GOAL #1   Title Pt will be independent in self managment of periorbital lymphedema with self manual lymph drainage , compression and exercise    Baseline no knowledge             PT Long Term Goals - 11/03/19 1610      PT LONG TERM GOAL #1   Title Pt will iincrease right eye opening to 1.9 cm    Baseline 1.3 cm on 11/03/2019    Time 8    Period Weeks    Status New      PT LONG TERM GOAL #2   Title --    Baseline --    Time --    Period --    Status --                 Plan - 12/07/19 1744    Clinical Impression Statement Pt continues with increased lymphedema in the Rt inferior orbital area this session. Reviewed technique while performing. Pt reports he really struggles with light pressure. He had compression pump  demo Friday and reports being glad about this option as the light pressure required for the manual lymph drainage has been an issue for him. Due to this and ongoing lymphedema a Flexitouch pneumatic compression device is medically necessary for long term in home use in the management of lymphedema. Pt continues with good results after treatment reporting today he is able to see better out of Rt eye due to less edema present at end of session. Issued information about how pt can order a Solaris Versi-Face Swell Spot for eye coverage since Medicaid denied coverage.    Personal Factors and Comorbidities Comorbidity 3+    Comorbidities melanoma recurrance, right AKA with several revisions, Left great and first toe amputation    Stability/Clinical Decision Making Evolving/Moderate complexity   pt may be having radiation   Rehab Potential Good    PT Frequency 2x / week    PT Duration 8 weeks    PT Treatment/Interventions ADLs/Self Care Home Management;Therapeutic exercise;Patient/family education;Orthotic Fit/Training;Manual lymph drainage;Taping;Compression bandaging    PT Next Visit Plan Remeasure circumference. Continue performing MLD and reviewing same with pt, try kinesiotape. ??? Did he order eye compression patch? Issue small dotted Medi foam to wear over orbital area with compression if so. If he hasn't ordered try making chip pack in thick stockinette???    PT Home Exercise Plan Self MLD    Consulted and Agree with Plan of Care Patient           Patient will benefit from skilled therapeutic intervention in order to improve the following deficits and impairments:  Increased edema, Decreased knowledge of use of DME, Decreased knowledge of precautions, Postural dysfunction  Visit Diagnosis: Lymphedema, not elsewhere classified  Abnormal posture     Problem List Patient Active Problem List   Diagnosis Date  Noted  . Melanoma of cheek ([place]) 10/15/2019  . Acute respiratory failure with  hypoxia ([place]) 07/11/2019  . Acute respiratory disease due to COVID-19 virus 07/11/2019  . Wound infection 06/22/2019  . Abscess 06/22/2019  . CKD (chronic kidney disease), stage III 05/23/2019  . Tobacco use 05/23/2019  . Sepsis ([place]) 05/23/2019  . S/P AKA (above knee amputation) unilateral, right ([place]) 12/05/2018  . Amputation stump infection ([place])   . Lymphedema of left leg   . Diabetic polyneuropathy associated with type 2 diabetes mellitus ([place])   . Above knee amputation of right lower extremity ([place]) 10/22/2018  . Dehiscence of amputation stump ([place])   . 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[place]) 08/12/2018  . Candidiasis 08/12/2018  . Necrosis of toe ([place]) 07/08/2018  . Amputated toe of left foot ([place]) 06/18/2018  . Streptococcal infection   . Pressure injury of skin 06/06/2018  . Cutaneous abscess of left foot   . Moderate protein-calorie malnutrition ([place])   . Cellulitis of left leg   . Septic arthritis ([place]) 06/03/2018  . Idiopathic chronic venous hypertension of left lower extremity with ulcer and inflammation ([place]) 11/14/2017  . Great toe amputation status, left 11/14/2017  . Osteomyelitis ([place]) 03/25/2017  . Skin cancer 03/25/2017  . Depression, recurrent ([place]) 11/05/2016  . Diabetic ulcer of toe of left foot ([place]) 10/02/2016  . 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 Chronic pain 09/27/2016  . Dyslipidemia associated with type 2 diabetes mellitus ([place]) 06/06/2016  . GERD without esophagitis 05/11/2016  . Hypertensive heart disease with congestive heart failure ([place]) 12/15/2015  . Spinal stenosis in cervical region 12/15/2015  . Type II diabetes mellitus with neurological manifestations ([place]) 12/15/2015  . Chronic diastolic CHF (congestive heart failure) ([place])   . Pulmonary hypertension ([place])   . Urinary retention   . Diskitis   . Foot drop, bilateral   . Sepsis with acute renal failure without septic shock ([place])   . MRSA bacteremia 10/03/2015  . COPD (chronic  obstructive pulmonary disease) ([place]) 10/03/2015  . Acute osteomyelitis of left foot ([place]) 10/03/2015    Otelia Limes, PTA 12/07/2019, 6:04 PM  [place], [place], [address] Phone: [phone]   Fax:  [phone]  Name: ADLER CHARTRAND MRN: [id number] Date of Birth: [birth year]

## 2019-12-09 ENCOUNTER — Encounter: Payer: Medicaid Other | Admitting: Physical Therapy

## 2019-12-16 ENCOUNTER — Ambulatory Visit: Payer: Medicaid Other | Admitting: Physical Therapy

## 2019-12-16 ENCOUNTER — Other Ambulatory Visit: Payer: Self-pay

## 2019-12-16 ENCOUNTER — Encounter: Payer: Self-pay | Admitting: Physical Therapy

## 2019-12-16 DIAGNOSIS — I890 Lymphedema, not elsewhere classified: Secondary | ICD-10-CM | POA: Diagnosis not present

## 2019-12-16 NOTE — Therapy (Signed)
[place], [place], [address] Phone: [phone]   Fax:  [phone]  Physical Therapy Treatment  Patient Details  Name: Gregory [name] MRN: [id number] Date of Birth: Jan 17, 1961 Referring Provider (PT): Shona Simpson    Encounter Date: 12/16/2019   PT End of Session - 12/16/19 1456    Visit Number 4    Number of Visits 12    Date for PT Re-Evaluation 01/04/20    Authorization Type send new medicaid auth by 6/8 for 2x/wk for 4 wks; new auth 6/17-7/14/21 for 8 visits    Authorization - Visit Number 2    Authorization - Number of Visits 8    PT Start Time 6962   pt arrived late   PT Stop Time 1455    PT Time Calculation (min) 43 min    Activity Tolerance Patient tolerated treatment well    Behavior During Therapy [facility] for tasks assessed/performed           Past Medical History:  Diagnosis Date  . Acquired absence of left great toe ([place])   . Acquired absence of right leg below knee ([place])   . Acute osteomyelitis of calcaneum, right ([place]) 10/02/2016  . Acute respiratory failure ([place])   . Amputation stump infection ([place]) 08/12/2018  . Anemia   . Basal cell carcinoma, eyelid   . Cancer ([place])   . Candidiasis 08/12/2018  . CHF (congestive heart failure) (HCC)    chronic diastolic   . Chronic back pain   . Chronic kidney disease    stage 3   . Chronic multifocal osteomyelitis (HCC)    of ankle and foot   . Chronic pain syndrome   . COPD (chronic obstructive pulmonary disease) ([place])   . DDD (degenerative disc disease), lumbar   . Depression    major depressive disorder   . Diabetes mellitus without complication ([place])    type 2   . Diabetic ulcer of toe of left foot ([place]) 10/02/2016  . Difficulty in walking, not elsewhere classified   . 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 Foot drop, bilateral   . Foot drop, right 12/27/2015  . GERD (gastroesophageal reflux disease)   . Herpesviral vesicular dermatitis     . Hyperlipidemia   . Hyperlipidemia   . Hypertension   . Insomnia   . Malignant melanoma of other parts of face ([place])   . MRSA bacteremia   . Muscle weakness (generalized)   . Nasal congestion   . Necrosis of toe ([place]) 07/08/2018  . Neuromuscular dysfunction of bladder   . Osteomyelitis ([place])   . Osteomyelitis ([place])   . Osteomyelitis ([place])    right BKA  . Other idiopathic peripheral autonomic neuropathy   . Retention of urine, unspecified   . Squamous cell carcinoma of skin   . Unsteadiness on feet   . Urinary retention   . Urinary retention   . Urinary tract infection   . Wears dentures   . Wears glasses     Past Surgical History:  Procedure Laterality Date  . AMPUTATION Left 03/29/2017   Procedure: LEFT GREAT TOE AMPUTATION AT METATARSOPHALANGEAL JOINT;  Surgeon: [name], MD;  Location: [place];  Service: Orthopedics;  Laterality: Left;  . AMPUTATION Right 03/29/2017   Procedure: RIGHT BELOW KNEE AMPUTATION;  Surgeon: [name], MD;  Location: [place];  Service: Orthopedics;  Laterality: Right;  . AMPUTATION Left 06/06/2018   Procedure: LEFT 2ND TOE AMPUTATION;  Surgeon:  [name], MD;  Location: [place];  Service: Orthopedics;  Laterality: Left;  . AMPUTATION Right 11/19/2018   Procedure: AMPUTATION ABOVE KNEE;  Surgeon: [name], MD;  Location: [place];  Service: Orthopedics;  Laterality: Right;  . ANTERIOR CERVICAL CORPECTOMY N/A 11/25/2015   Procedure: ANTERIOR CERVICAL FIVE CORPECTOMY Cervical four - six fusion;  Surgeon: Consuella Lose, MD;  Location: [place] [facility];  Service: Neurosurgery;  Laterality: N/A;  ANTERIOR CERVICAL FIVE CORPECTOMY Cervical four - six fusion  . APPLICATION OF WOUND VAC Right 10/22/2018   Procedure: Application Of Wound Vac;  Surgeon: [name], MD;  Location: [place];  Service: Orthopedics;  Laterality: Right;  . CYSTOSCOPY WITH RETROGRADE URETHROGRAM N/A 04/25/2018   Procedure: CYSTOSCOPY WITH RETROGRADE URETHROGRAM/ BALLOON  DILATION;  Surgeon: Kathie Rhodes, MD;  [facility];  Service: Urology;  Laterality: N/A;  . MULTIPLE TOOTH EXTRACTIONS    . POSTERIOR CERVICAL FUSION/FORAMINOTOMY N/A 11/29/2015   Procedure: Cervical Three-Cervical Seven Posterior Cervical Laminectomy with Fusion;  Surgeon: Consuella Lose, MD;  Location: [place] [facility];  Service: Neurosurgery;  Laterality: N/A;  Cervical Three-Cervical Seven Posterior Cervical Laminectomy with Fusion  . STUMP REVISION Right 10/22/2018   Procedure: REVISION RIGHT BELOW KNEE AMPUTATION;  Surgeon: [name], MD;  Location: [place];  Service: Orthopedics;  Laterality: Right;  . STUMP REVISION Right 12/05/2018   Procedure: Revision Right Above Knee Amputation;  Surgeon: [name], MD;  Location: [place];  Service: Orthopedics;  Laterality: Right;  . STUMP REVISION Right 05/29/2019   Procedure: REVISION RIGHT ABOVE KNEE AMPUTATION;  Surgeon: [name], MD;  Location: [place];  Service: Orthopedics;  Laterality: Right;  . STUMP REVISION Right 06/24/2019   Procedure: STUMP REVISION;  Surgeon: [name], MD;  Location: [place];  Service: Orthopedics;  Laterality: Right;    There were no vitals filed for this visit.   Subjective Assessment - 12/16/19 1419    Subjective I think I make the swelling worse. I am doing the massage too hard. 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    Patient Stated Goals to get his eye swelling down    Currently in Pain? Yes    Pain Score 7     Pain Location Leg    Pain Orientation Right    Pain Descriptors / Indicators  Shooting;Stabbing;Pins and needles    Pain Type Chronic pain    Pain Onset More than a month ago    Pain Frequency Constant                             [facility] Adult PT Treatment/Exercise - 12/16/19 0001      Manual Therapy   Manual Therapy Manual Lymphatic Drainage (MLD);Edema management    Edema Management Issued info to pt for pt to obtain a weight eye mask to help decrease swelling    Manual Lymphatic Drainage (MLD) short neck, bilateral axillary nodes, bilateral shoulder collectors, short neck, posterior, lateral and anterior neck moving fluid towards pathway at lateral  neck, then jaw and lower face moving fluid towards pathway then majority of time spent at upper face and around right eye moving fluid down face anterior to ear then down pathay on lateral neck then retracing all steps. Focused on area of edema and fibrosis just superior to cheek scar and interior to cheek scar. Did not do massage over scar because it is still healing                    PT Short Term Goals - 11/03/19 1621      PT SHORT TERM GOAL #1   Title Pt will be independent in self managment of periorbital lymphedema with self manual lymph drainage , compression and exercise    Baseline no knowledge             PT Long Term Goals - 11/03/19 1610      PT LONG TERM GOAL #1   Title Pt will iincrease right eye opening to 1.9 cm    Baseline 1.3 cm on 11/03/2019    Time 8    Period Weeks    Status New      PT LONG TERM GOAL #2   Title --    Baseline --    Time --    Period --    Status --                 Plan - 12/16/19 1459    Clinical Impression Statement Pt has increased lymphedema around R eye. He reports it worsens when he tries to do self MLD. He is unable to do light pressure. He should recieve a compression pump in a few weeks. Continued with MLD to R eye. Issued info to pt so he can obtain a weighted eye mask to help decrease swelling. Following treatment pt  had increased eye opening and improved skin mobility.    PT Frequency 2x / week    PT Duration 8 weeks    PT Treatment/Interventions ADLs/Self Care Home Management;Therapeutic exercise;Patient/family education;Orthotic Fit/Training;Manual lymph drainage;Taping;Compression bandaging    PT Next Visit Plan Remeasure circumference. Continue performing MLD and reviewing same with pt, try kinesiotape. ??? Did he order eye compression patch? Issue small dotted Medi foam to wear over orbital area with compression if so. If he hasn't ordered try making chip pack in thick stockinette???    PT Home Exercise Plan Self MLD    Consulted and Agree with Plan of Care Patient           Patient will benefit from skilled therapeutic intervention in order to improve the following deficits and impairments:  Increased edema, Decreased knowledge of use of DME, Decreased knowledge of precautions, Postural dysfunction  Visit Diagnosis: Lymphedema, not elsewhere classified     Problem List Patient Active Problem List   Diagnosis Date Noted  . Melanoma of cheek ([place]) 10/15/2019  . Acute respiratory failure with hypoxia ([place]) 07/11/2019  . Acute respiratory disease due to COVID-19 virus 07/11/2019  . Wound infection 06/22/2019  . Abscess 06/22/2019  . CKD (chronic kidney disease), stage III 05/23/2019  . Tobacco use 05/23/2019  . Sepsis ([place]) 05/23/2019  . S/P AKA (above knee amputation) unilateral, right ([place]) 12/05/2018  . Amputation stump infection ([place])   . Lymphedema of left leg   . Diabetic polyneuropathy associated with type 2 diabetes mellitus ([place])   . Above knee amputation of right lower extremity ([place]) 10/22/2018  . Dehiscence of amputation stump ([place])   .  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[name]) 08/12/2018  . Candidiasis 08/12/2018  . Necrosis of toe ([place]) 07/08/2018  . Amputated toe of left foot ([place]) 06/18/2018  . Streptococcal infection   . Pressure injury of skin 06/06/2018  .  Cutaneous abscess of left foot   . Moderate protein-calorie malnutrition ([place])   . Cellulitis of left leg   . Septic arthritis ([place]) 06/03/2018  . Idiopathic chronic venous hypertension of left lower extremity with ulcer and inflammation ([place]) 11/14/2017  . Great toe amputation status, left 11/14/2017  . Osteomyelitis ([place]) 03/25/2017  . Skin cancer 03/25/2017  . Depression, recurrent ([place]) 11/05/2016  . Diabetic ulcer of toe of left foot ([place]) 10/02/2016  . 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 Chronic pain 09/27/2016  . Dyslipidemia associated with type 2 diabetes mellitus ([place]) 06/06/2016  . GERD without esophagitis 05/11/2016  . Hypertensive heart disease with congestive heart failure ([place]) 12/15/2015  . Spinal stenosis in cervical region 12/15/2015  . Type II diabetes mellitus with neurological manifestations ([place]) 12/15/2015  . Chronic diastolic CHF (congestive heart failure) ([place])   . Pulmonary hypertension ([place])   . Urinary retention   . Diskitis   . Foot drop, bilateral   . Sepsis with acute renal failure without septic shock ([place])   . MRSA bacteremia 10/03/2015  . COPD (chronic obstructive pulmonary disease) ([place]) 10/03/2015  . Acute osteomyelitis of left foot ([place]) 10/03/2015    Allyson Sabal [facility] 12/16/2019, 3:03 PM  [place], [place], [address] Phone: [phone]   Fax:  [phone]  Name: Gregory [name] MRN: [id number] Date of Birth: 1960/12/29  Manus Gunning, PT 12/16/19 3:03 PM

## 2019-12-23 ENCOUNTER — Ambulatory Visit: Payer: Medicaid Other | Attending: Radiation Oncology | Admitting: Physical Therapy

## 2019-12-23 DIAGNOSIS — R293 Abnormal posture: Secondary | ICD-10-CM | POA: Insufficient documentation

## 2019-12-23 DIAGNOSIS — I890 Lymphedema, not elsewhere classified: Secondary | ICD-10-CM | POA: Insufficient documentation

## 2019-12-25 ENCOUNTER — Other Ambulatory Visit: Payer: Self-pay

## 2019-12-25 ENCOUNTER — Encounter: Payer: Self-pay | Admitting: Rehabilitation

## 2019-12-25 ENCOUNTER — Ambulatory Visit: Payer: Medicaid Other | Admitting: Rehabilitation

## 2019-12-25 DIAGNOSIS — I890 Lymphedema, not elsewhere classified: Secondary | ICD-10-CM | POA: Diagnosis not present

## 2019-12-25 DIAGNOSIS — R293 Abnormal posture: Secondary | ICD-10-CM | POA: Diagnosis present

## 2019-12-25 NOTE — Therapy (Addendum)
[place], [place], [address] Phone: [phone]   Fax:  [phone]  Physical Therapy Treatment  Patient Details  Name: Gregory [name] MRN: [id number] Date of Birth: [birth year] Referring Provider (PT): Shona Simpson    Encounter Date: 12/25/2019   PT End of Session - 12/25/19 1155    Visit Number 5    Number of Visits 12    Date for PT Re-Evaluation 01/04/20    Authorization Type Medicaid; update new auth    Authorization - Visit Number 3    Authorization - Number of Visits 8    PT Start Time 3295    PT Stop Time 1155    PT Time Calculation (min) 58 min    Activity Tolerance Patient tolerated treatment well    Behavior During Therapy [facility] for tasks assessed/performed           Past Medical History:  Diagnosis Date  . Acquired absence of left great toe ([place])   . Acquired absence of right leg below knee ([place])   . Acute osteomyelitis of calcaneum, right ([place]) 10/02/2016  . Acute respiratory failure ([place])   . Amputation stump infection ([place]) 08/12/2018  . Anemia   . Basal cell carcinoma, eyelid   . Cancer ([place])   . Candidiasis 08/12/2018  . CHF (congestive heart failure) (HCC)    chronic diastolic   . Chronic back pain   . Chronic kidney disease    stage 3   . Chronic multifocal osteomyelitis (HCC)    of ankle and foot   . Chronic pain syndrome   . COPD (chronic obstructive pulmonary disease) ([place])   . DDD (degenerative disc disease), lumbar   . Depression    major depressive disorder   . Diabetes mellitus without complication ([place])    type 2   . Diabetic ulcer of toe of left foot ([place]) 10/02/2016  . Difficulty in walking, not elsewhere classified   . 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 Foot drop, bilateral   . Foot drop, right 12/27/2015  . GERD (gastroesophageal reflux disease)   . Herpesviral vesicular dermatitis   . Hyperlipidemia   . Hyperlipidemia   . Hypertension   . Insomnia   .  Malignant melanoma of other parts of face ([place])   . MRSA bacteremia   . Muscle weakness (generalized)   . Nasal congestion   . Necrosis of toe ([place]) 07/08/2018  . Neuromuscular dysfunction of bladder   . Osteomyelitis ([place])   . Osteomyelitis ([place])   . Osteomyelitis ([place])    right BKA  . Other idiopathic peripheral autonomic neuropathy   . Retention of urine, unspecified   . Squamous cell carcinoma of skin   . Unsteadiness on feet   . Urinary retention   . Urinary retention   . Urinary tract infection   . Wears dentures   . Wears glasses     Past Surgical History:  Procedure Laterality Date  . AMPUTATION Left 03/29/2017   Procedure: LEFT GREAT TOE AMPUTATION AT METATARSOPHALANGEAL JOINT;  Surgeon: [name], MD;  Location: [place];  Service: Orthopedics;  Laterality: Left;  . AMPUTATION Right 03/29/2017   Procedure: RIGHT BELOW KNEE AMPUTATION;  Surgeon: [name], MD;  Location: [place];  Service: Orthopedics;  Laterality: Right;  . AMPUTATION Left 06/06/2018   Procedure: LEFT 2ND TOE AMPUTATION;  Surgeon: [name], MD;  Location: [place];  Service: Orthopedics;  Laterality: Left;  . AMPUTATION Right  11/19/2018   Procedure: AMPUTATION ABOVE KNEE;  Surgeon: [name], MD;  Location: [place];  Service: Orthopedics;  Laterality: Right;  . ANTERIOR CERVICAL CORPECTOMY N/A 11/25/2015   Procedure: ANTERIOR CERVICAL FIVE CORPECTOMY Cervical four - six fusion;  Surgeon: Consuella Lose, MD;  Location: [place] [facility];  Service: Neurosurgery;  Laterality: N/A;  ANTERIOR CERVICAL FIVE CORPECTOMY Cervical four - six fusion  . APPLICATION OF WOUND VAC Right 10/22/2018   Procedure: Application Of Wound Vac;  Surgeon: [name], MD;  Location: [place];  Service: Orthopedics;  Laterality: Right;  . CYSTOSCOPY WITH RETROGRADE URETHROGRAM N/A 04/25/2018   Procedure: CYSTOSCOPY WITH RETROGRADE URETHROGRAM/ BALLOON DILATION;  Surgeon: Kathie Rhodes, MD;  [facility];  Service: Urology;   Laterality: N/A;  . MULTIPLE TOOTH EXTRACTIONS    . POSTERIOR CERVICAL FUSION/FORAMINOTOMY N/A 11/29/2015   Procedure: Cervical Three-Cervical Seven Posterior Cervical Laminectomy with Fusion;  Surgeon: Consuella Lose, MD;  Location: [place] [facility];  Service: Neurosurgery;  Laterality: N/A;  Cervical Three-Cervical Seven Posterior Cervical Laminectomy with Fusion  . STUMP REVISION Right 10/22/2018   Procedure: REVISION RIGHT BELOW KNEE AMPUTATION;  Surgeon: [name], MD;  Location: [place];  Service: Orthopedics;  Laterality: Right;  . STUMP REVISION Right 12/05/2018   Procedure: Revision Right Above Knee Amputation;  Surgeon: [name], MD;  Location: [place];  Service: Orthopedics;  Laterality: Right;  . STUMP REVISION Right 05/29/2019   Procedure: REVISION RIGHT ABOVE KNEE AMPUTATION;  Surgeon: [name], MD;  Location: [place];  Service: Orthopedics;  Laterality: Right;  . STUMP REVISION Right 06/24/2019   Procedure: STUMP REVISION;  Surgeon: [name], MD;  Location: [place];  Service: Orthopedics;  Laterality: Right;    There were no vitals filed for this visit.   Subjective Assessment - 12/25/19 1055    Subjective It just does not work. 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    Patient Stated Goals to get his eye swelling down    Currently in Pain? Yes    Pain Score 7     Pain Location Leg    Pain Orientation Right    Pain Descriptors / Indicators Shooting;Stabbing    Pain Type Chronic pain    Pain Onset More than a month ago    Pain Frequency Constant                 LYMPHEDEMA/ONCOLOGY QUESTIONNAIRE - 12/25/19 0001       Lymphedema Assessments   Lymphedema Assessments --   1.3                       [facility] Adult PT Treatment/Exercise - 12/25/19 0001      Manual Therapy   Edema Management Derek from [employer] happened to be in the lobby to talk with pt regarding pump which is being held waiting for insurance     Manual Lymphatic Drainage (MLD) short neck, bilateral axillary nodes, bilateral shoulder collectors, posterior, lateral and anterior neck moving fluid towards pathway at lateral neck, then jaw and lower face moving fluid towards pathway then majority of time spent at  upper face and around right eye moving fluid down face anterior to ear then down pathay on lateral neck then retracing all steps. Focused on area of edema and fibrosis just superior to cheek scar and interior to cheek scar. Did not do massage over scar because it is still healing                    PT Short Term Goals - 11/03/19 1621      PT SHORT TERM GOAL #1   Title Pt will be independent in self managment of periorbital lymphedema with self manual lymph drainage , compression and exercise    Baseline no knowledge             PT Long Term Goals - 12/25/19 1201      PT LONG TERM GOAL #1   Title Pt will iincrease right eye opening to 1.9 cm    Baseline 1.3 cm on 11/03/2019, 1.3 on 12/25/19    Status On-going      PT LONG TERM GOAL #2   Title Pt will be ind in self management of periorbital lymphedema and self manual lymph drainage , compression, and exercise    Time 4    Period Weeks    Status Partially Met                 Plan - 12/25/19 1156    Clinical Impression Statement Pt continues with increased edema inferior to the Rt eye and above his recent melanoma removal incision.  The incision is still open proximally but not draining.  Pt continues with eye opening of 1.3cm and has yet to get any compression recommendations for home.  His compression pump is processing and should be here in the  next 2 weeks per the rep.  Pt will benefit from continued PT until pump arrives and compression is obtained.    PT Frequency 2x / week    PT Duration 8 weeks    PT Treatment/Interventions ADLs/Self Care Home Management;Therapeutic exercise;Patient/family education;Orthotic Fit/Training;Manual lymph drainage;Taping;Compression bandaging    PT Next Visit Plan Continue performing MLD and reviewing same with pt, Did he order eye compression patch? Try foam under night mask if pt gets this    PT Home Exercise Plan Self MLD    Consulted and Agree with Plan of Care Patient           Patient will benefit from skilled therapeutic intervention in order to improve the following deficits and impairments:     Visit Diagnosis: Lymphedema, not elsewhere classified  Abnormal posture     Problem List Patient Active Problem List   Diagnosis Date Noted  . Melanoma of cheek ([place]) 10/15/2019  . Acute respiratory failure with hypoxia ([place]) 07/11/2019  . Acute respiratory disease due to COVID-19 virus 07/11/2019  . Wound infection 06/22/2019  . Abscess 06/22/2019  . CKD (chronic kidney disease), stage III 05/23/2019  . Tobacco use 05/23/2019  . Sepsis ([place]) 05/23/2019  . S/P AKA (above knee amputation) unilateral, right ([place]) 12/05/2018  . Amputation stump infection ([place])   . Lymphedema of left leg   . Diabetic polyneuropathy associated with type 2 diabetes mellitus ([place])   . Above knee amputation of right lower extremity ([place]) 10/22/2018  . Dehiscence of amputation stump ([place])   . 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[place]) 08/12/2018  . Candidiasis 08/12/2018  . Necrosis of toe ([place]) 07/08/2018  . Amputated toe of left foot ([place]) 06/18/2018  . Streptococcal  infection   . Pressure injury of skin 06/06/2018  . Cutaneous abscess of left foot   . Moderate protein-calorie malnutrition ([place])   . Cellulitis of left leg   . Septic arthritis ([place]) 06/03/2018  . 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 Great toe amputation status, left 11/14/2017  . Osteomyelitis ([place]) 03/25/2017  . Skin cancer 03/25/2017  . Depression, recurrent ([place]) 11/05/2016  . Diabetic ulcer of toe of left foot ([place]) 10/02/2016  . 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 Chronic pain 09/27/2016  . Dyslipidemia associated with type 2 diabetes mellitus ([place]) 06/06/2016  . GERD without esophagitis 05/11/2016  . Hypertensive heart disease with congestive heart failure ([place]) 12/15/2015  . Spinal stenosis in cervical region 12/15/2015  . Type II diabetes mellitus with neurological manifestations ([place]) 12/15/2015  . Chronic diastolic CHF (congestive heart failure) ([place])   . Pulmonary hypertension ([place])   . Urinary retention   . Diskitis   . Foot drop, bilateral   . Sepsis with acute renal failure without septic shock ([place])   . MRSA bacteremia 10/03/2015  . COPD (chronic obstructive pulmonary disease) ([place]) 10/03/2015  . Acute osteomyelitis of left foot ([place]) 10/03/2015    Stark Bray 12/25/2019, 12:02 PM  [place], [place], [address] Phone: [phone]   Fax:  [phone]  Name: Gregory [name] MRN: [id number] Date of Birth: 1960-08-20

## 2020-01-05 ENCOUNTER — Other Ambulatory Visit: Payer: Self-pay

## 2020-01-05 ENCOUNTER — Ambulatory Visit: Payer: Medicaid Other | Admitting: Rehabilitation

## 2020-01-05 ENCOUNTER — Encounter: Payer: Self-pay | Admitting: Rehabilitation

## 2020-01-05 DIAGNOSIS — I890 Lymphedema, not elsewhere classified: Secondary | ICD-10-CM | POA: Diagnosis not present

## 2020-01-05 DIAGNOSIS — R293 Abnormal posture: Secondary | ICD-10-CM

## 2020-01-05 NOTE — Therapy (Signed)
[place], [place], [address] Phone: [phone]   Fax:  [phone]  Physical Therapy Treatment  Patient Details  Name: Gregory [name] MRN: [id number] Date of Birth: 07-Jun-1961 Referring Provider (PT): Shona Simpson    Encounter Date: 01/05/2020   PT End of Session - 01/05/20 1153    Visit Number 6    Number of Visits 12    Date for PT Re-Evaluation 01/26/20    Authorization Type Medicaid    Authorization Time Period 01/04/20-01/24/20    Authorization - Visit Number 1    Authorization - Number of Visits 6    PT Start Time 1100    PT Stop Time 1150    PT Time Calculation (min) 50 min    Activity Tolerance Patient tolerated treatment well    Behavior During Therapy [facility] for tasks assessed/performed           Past Medical History:  Diagnosis Date  . Acquired absence of left great toe ([place])   . Acquired absence of right leg below knee ([place])   . Acute osteomyelitis of calcaneum, right ([place]) 10/02/2016  . Acute respiratory failure ([place])   . Amputation stump infection ([place]) 08/12/2018  . Anemia   . Basal cell carcinoma, eyelid   . Cancer ([place])   . Candidiasis 08/12/2018  . CHF (congestive heart failure) (HCC)    chronic diastolic   . Chronic back pain   . Chronic kidney disease    stage 3   . Chronic multifocal osteomyelitis (HCC)    of ankle and foot   . Chronic pain syndrome   . COPD (chronic obstructive pulmonary disease) ([place])   . DDD (degenerative disc disease), lumbar   . Depression    major depressive disorder   . Diabetes mellitus without complication ([place])    type 2   . Diabetic ulcer of toe of left foot ([place]) 10/02/2016  . Difficulty in walking, not elsewhere classified   . 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 Foot drop, bilateral   . Foot drop, right 12/27/2015  . GERD (gastroesophageal reflux disease)   . Herpesviral vesicular dermatitis   . Hyperlipidemia   . Hyperlipidemia   .  Hypertension   . Insomnia   . Malignant melanoma of other parts of face ([place])   . MRSA bacteremia   . Muscle weakness (generalized)   . Nasal congestion   . Necrosis of toe ([place]) 07/08/2018  . Neuromuscular dysfunction of bladder   . Osteomyelitis ([place])   . Osteomyelitis ([place])   . Osteomyelitis ([place])    right BKA  . Other idiopathic peripheral autonomic neuropathy   . Retention of urine, unspecified   . Squamous cell carcinoma of skin   . Unsteadiness on feet   . Urinary retention   . Urinary retention   . Urinary tract infection   . Wears dentures   . Wears glasses     Past Surgical History:  Procedure Laterality Date  . AMPUTATION Left 03/29/2017   Procedure: LEFT GREAT TOE AMPUTATION AT METATARSOPHALANGEAL JOINT;  Surgeon: [name], MD;  Location: [place];  Service: Orthopedics;  Laterality: Left;  . AMPUTATION Right 03/29/2017   Procedure: RIGHT BELOW KNEE AMPUTATION;  Surgeon: [name], MD;  Location: [place];  Service: Orthopedics;  Laterality: Right;  . AMPUTATION Left 06/06/2018   Procedure: LEFT 2ND TOE AMPUTATION;  Surgeon: [name], MD;  Location: [place];  Service: Orthopedics;  Laterality: Left;  .  AMPUTATION Right 11/19/2018   Procedure: AMPUTATION ABOVE KNEE;  Surgeon: [name], MD;  Location: [place];  Service: Orthopedics;  Laterality: Right;  . ANTERIOR CERVICAL CORPECTOMY N/A 11/25/2015   Procedure: ANTERIOR CERVICAL FIVE CORPECTOMY Cervical four - six fusion;  Surgeon: Consuella Lose, MD;  Location[place] [facility];  Service: Neurosurgery;  Laterality: N/A;  ANTERIOR CERVICAL FIVE CORPECTOMY Cervical four - six fusion  . APPLICATION OF WOUND VAC Right 10/22/2018   Procedure: Application Of Wound Vac;  Surgeon: [name], MD;  Location: [place];  Service: Orthopedics;  Laterality: Right;  . CYSTOSCOPY WITH RETROGRADE URETHROGRAM N/A 04/25/2018   Procedure: CYSTOSCOPY WITH RETROGRADE URETHROGRAM/ BALLOON DILATION;  Surgeon: Kathie Rhodes, MD;   [facility];  Service: Urology;  Laterality: N/A;  . MULTIPLE TOOTH EXTRACTIONS    . POSTERIOR CERVICAL FUSION/FORAMINOTOMY N/A 11/29/2015   Procedure: Cervical Three-Cervical Seven Posterior Cervical Laminectomy with Fusion;  Surgeon: Consuella Lose, MD;  Location: [place] [facility];  Service: Neurosurgery;  Laterality: N/A;  Cervical Three-Cervical Seven Posterior Cervical Laminectomy with Fusion  . STUMP REVISION Right 10/22/2018   Procedure: REVISION RIGHT BELOW KNEE AMPUTATION;  Surgeon: [name], MD;  Location: [place];  Service: Orthopedics;  Laterality: Right;  . STUMP REVISION Right 12/05/2018   Procedure: Revision Right Above Knee Amputation;  Surgeon: [name], MD;  Location: [place];  Service: Orthopedics;  Laterality: Right;  . STUMP REVISION Right 05/29/2019   Procedure: REVISION RIGHT ABOVE KNEE AMPUTATION;  Surgeon: [name], MD;  Location: [place];  Service: Orthopedics;  Laterality: Right;  . STUMP REVISION Right 06/24/2019   Procedure: STUMP REVISION;  Surgeon: [name], MD;  Location: [place];  Service: Orthopedics;  Laterality: Right;    There were no vitals filed for this visit.   Subjective Assessment - 01/05/20 1151    Subjective I've been using this eye patch and it is better!    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    Patient Stated Goals to get his eye swelling down    Currently in Pain? No/denies                             [facility] Adult PT Treatment/Exercise - 01/05/20 0001      Manual Therapy   Manual therapy comments made pt small circle of 1/2" foam with eye space cut out and wrapped in tgsoft for use under eye patch as pt has been  making progress with this patch alone with the skin much more flat today    Edema Management called Vicente Males from [employer] who said it may be around 12-14 days from now for the pump due to insurance papers    Manual Lymphatic Drainage (MLD) short neck, bilateral axillary nodes, bilateral shoulder collectors, posterior, lateral and anterior neck moving fluid towards pathway at lateral neck, then jaw and lower face moving fluid towards pathway then majority of time spent at upper face and around right eye moving fluid down face anterior to ear then down pathay on lateral neck then retracing all steps. Focused on area of edema and fibrosis just superior to cheek scar and interior to cheek  scar. Did not do massage over scar because it is still healing                    PT Education - 01/05/20 1153    Education Details using eye patch    Person(s) Educated Patient    Methods Explanation    Comprehension Verbalized understanding            PT Short Term Goals - 11/03/19 1621      PT SHORT TERM GOAL #1   Title Pt will be independent in self managment of periorbital lymphedema with self manual lymph drainage , compression and exercise    Baseline no knowledge             PT Long Term Goals - 12/25/19 1201      PT LONG TERM GOAL #1   Title Pt will iincrease right eye opening to 1.9 cm    Baseline 1.3 cm on 11/03/2019, 1.3 on 12/25/19    Status On-going      PT LONG TERM GOAL #2   Title Pt will be ind in self management of periorbital lymphedema and self manual lymph drainage , compression, and exercise    Time 4    Period Weeks    Status Partially Met                 Plan - 01/05/20 1155    Clinical Impression Statement Pt arrives with edema under Rt eye much improved after fashioning some compression with an eye patch turned inside out and using this at home.  Updated the compression to include some foam wiht the eye cut in to decrease eye pressure.  Pt is also aware  to not have too much eye pressure when wearing this.  See photo for visual changes.  Eye also appears more open but did not measure today.  Will continue treatment until pump arrives.  Incision is also still not fully healed.    PT Frequency 2x / week    PT Duration 6 weeks    PT Treatment/Interventions ADLs/Self Care Home Management;Therapeutic exercise;Patient/family education;Orthotic Fit/Training;Manual lymph drainage;Taping;Compression bandaging    PT Next Visit Plan how was foam? , measure eye opening, Continue performing MLD and reviewing same with pt    PT Home Exercise Plan Self MLD    Consulted and Agree with Plan of Care Patient           Patient will benefit from skilled therapeutic intervention in order to improve the following deficits and impairments:     Visit Diagnosis: Lymphedema, not elsewhere classified  Abnormal posture     Problem List Patient Active Problem List   Diagnosis Date Noted  . Melanoma of cheek ([place]) 10/15/2019  . Acute respiratory failure with hypoxia ([place]) 07/11/2019  . Acute respiratory disease due to COVID-19 virus 07/11/2019  . Wound infection 06/22/2019  . Abscess 06/22/2019  . CKD (chronic kidney disease), stage III 05/23/2019  . Tobacco use 05/23/2019  . Sepsis ([place]) 05/23/2019  . S/P AKA (above knee amputation) unilateral, right ([place]) 12/05/2018  . Amputation stump infection ([place])   . Lymphedema of left leg   . Diabetic polyneuropathy associated with type 2 diabetes mellitus ([place])   . Above knee amputation of right lower extremity ([place]) 10/22/2018  . Dehiscence of amputation stump ([place])   . 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[place]) 08/12/2018  . Candidiasis 08/12/2018  . Necrosis of toe ([place]) 07/08/2018  . Amputated toe of left  foot ([place]) 06/18/2018  . Streptococcal infection   . Pressure injury of skin 06/06/2018  . Cutaneous abscess of left foot   . Moderate protein-calorie malnutrition ([place])   . Cellulitis of left leg   .  Septic arthritis ([place]) 06/03/2018  . Idiopathic chronic venous hypertension of left lower extremity with ulcer and inflammation ([place]) 11/14/2017  . Great toe amputation status, left 11/14/2017  . Osteomyelitis ([place]) 03/25/2017  . Skin cancer 03/25/2017  . Depression, recurrent ([place]) 11/05/2016  . Diabetic ulcer of toe of left foot ([place]) 10/02/2016  . 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 Chronic pain 09/27/2016  . Dyslipidemia associated with type 2 diabetes mellitus ([place]) 06/06/2016  . GERD without esophagitis 05/11/2016  . Hypertensive heart disease with congestive heart failure ([place]) 12/15/2015  . Spinal stenosis in cervical region 12/15/2015  . Type II diabetes mellitus with neurological manifestations ([place]) 12/15/2015  . Chronic diastolic CHF (congestive heart failure) ([place])   . Pulmonary hypertension ([place])   . Urinary retention   . Diskitis   . Foot drop, bilateral   . Sepsis with acute renal failure without septic shock ([place])   . MRSA bacteremia 10/03/2015  . COPD (chronic obstructive pulmonary disease) ([place]) 10/03/2015  . Acute osteomyelitis of left foot ([place]) 10/03/2015    Stark Bray 01/05/2020, 11:58 AM  [place], [place], [address] Phone: [phone]   Fax:  [phone]  Name: Gregory [name] MRN: [id number] Date of Birth: 04-11-1961

## 2020-01-07 ENCOUNTER — Ambulatory Visit: Payer: Medicaid Other | Admitting: Rehabilitation

## 2020-01-12 ENCOUNTER — Encounter: Payer: Medicaid Other | Admitting: Rehabilitation

## 2020-01-14 ENCOUNTER — Encounter: Payer: Self-pay | Admitting: Rehabilitation

## 2020-01-14 ENCOUNTER — Other Ambulatory Visit: Payer: Self-pay

## 2020-01-14 ENCOUNTER — Ambulatory Visit: Payer: Medicaid Other | Admitting: Rehabilitation

## 2020-01-14 DIAGNOSIS — R293 Abnormal posture: Secondary | ICD-10-CM

## 2020-01-14 DIAGNOSIS — I890 Lymphedema, not elsewhere classified: Secondary | ICD-10-CM | POA: Diagnosis not present

## 2020-01-14 NOTE — Therapy (Signed)
[place], [place], [address] Phone: [phone]   Fax:  [phone]  Physical Therapy Treatment  Patient Details  Name: Gregory [name] MRN: [id number] Date of Birth: [birth year] Referring Provider (PT): Shona Simpson    Encounter Date: 01/14/2020   PT End of Session - 01/14/20 1142    Visit Number 7    Number of Visits 12    Date for PT Re-Evaluation 01/26/20    Authorization Type Medicaid    Authorization Time Period 01/04/20-01/24/20    Authorization - Visit Number 2    Authorization - Number of Visits 6    PT Start Time 7342    PT Stop Time 8768   pt wanting to end early after a restroom need   PT Time Calculation (min) 38 min    Activity Tolerance Patient tolerated treatment well    Behavior During Therapy [facility] for tasks assessed/performed           Past Medical History:  Diagnosis Date  . Acquired absence of left great toe ([place])   . Acquired absence of right leg below knee ([place])   . Acute osteomyelitis of calcaneum, right ([place]) 10/02/2016  . Acute respiratory failure ([place])   . Amputation stump infection ([place]) 08/12/2018  . Anemia   . Basal cell carcinoma, eyelid   . Cancer ([place])   . Candidiasis 08/12/2018  . CHF (congestive heart failure) (HCC)    chronic diastolic   . Chronic back pain   . Chronic kidney disease    stage 3   . Chronic multifocal osteomyelitis (HCC)    of ankle and foot   . Chronic pain syndrome   . COPD (chronic obstructive pulmonary disease) ([place])   . DDD (degenerative disc disease), lumbar   . Depression    major depressive disorder   . Diabetes mellitus without complication ([place])    type 2   . Diabetic ulcer of toe of left foot ([place]) 10/02/2016  . Difficulty in walking, not elsewhere classified   . 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 Foot drop, bilateral   . Foot drop, right 12/27/2015  . GERD (gastroesophageal reflux disease)   . Herpesviral vesicular dermatitis     . Hyperlipidemia   . Hyperlipidemia   . Hypertension   . Insomnia   . Malignant melanoma of other parts of face ([place])   . MRSA bacteremia   . Muscle weakness (generalized)   . Nasal congestion   . Necrosis of toe ([place]) 07/08/2018  . Neuromuscular dysfunction of bladder   . Osteomyelitis ([place])   . Osteomyelitis ([place])   . Osteomyelitis ([place])    right BKA  . Other idiopathic peripheral autonomic neuropathy   . Retention of urine, unspecified   . Squamous cell carcinoma of skin   . Unsteadiness on feet   . Urinary retention   . Urinary retention   . Urinary tract infection   . Wears dentures   . Wears glasses     Past Surgical History:  Procedure Laterality Date  . AMPUTATION Left 03/29/2017   Procedure: LEFT GREAT TOE AMPUTATION AT METATARSOPHALANGEAL JOINT;  Surgeon: [name], MD;  Location: [place];  Service: Orthopedics;  Laterality: Left;  . AMPUTATION Right 03/29/2017   Procedure: RIGHT BELOW KNEE AMPUTATION;  Surgeon: [name], MD;  Location: [place];  Service: Orthopedics;  Laterality: Right;  . AMPUTATION Left 06/06/2018   Procedure: LEFT 2ND TOE AMPUTATION;  Surgeon: [name],  MD;  Location: [place];  Service: Orthopedics;  Laterality: Left;  . AMPUTATION Right 11/19/2018   Procedure: AMPUTATION ABOVE KNEE;  Surgeon: [name], MD;  Location: [place];  Service: Orthopedics;  Laterality: Right;  . ANTERIOR CERVICAL CORPECTOMY N/A 11/25/2015   Procedure: ANTERIOR CERVICAL FIVE CORPECTOMY Cervical four - six fusion;  Surgeon: Consuella Lose, MD;  Location: [place] [facility];  Service: Neurosurgery;  Laterality: N/A;  ANTERIOR CERVICAL FIVE CORPECTOMY Cervical four - six fusion  . APPLICATION OF WOUND VAC Right 10/22/2018   Procedure: Application Of Wound Vac;  Surgeon: [name], MD;  Location: [place];  Service: Orthopedics;  Laterality: Right;  . CYSTOSCOPY WITH RETROGRADE URETHROGRAM N/A 04/25/2018   Procedure: CYSTOSCOPY WITH RETROGRADE URETHROGRAM/ BALLOON  DILATION;  Surgeon: Kathie Rhodes, MD;  Location: [facility];  Service: Urology;  Laterality: N/A;  . MULTIPLE TOOTH EXTRACTIONS    . POSTERIOR CERVICAL FUSION/FORAMINOTOMY N/A 11/29/2015   Procedure: Cervical Three-Cervical Seven Posterior Cervical Laminectomy with Fusion;  Surgeon: Consuella Lose, MD;  Location: [place] [facility];  Service: Neurosurgery;  Laterality: N/A;  Cervical Three-Cervical Seven Posterior Cervical Laminectomy with Fusion  . STUMP REVISION Right 10/22/2018   Procedure: REVISION RIGHT BELOW KNEE AMPUTATION;  Surgeon: [name], MD;  Location: [place];  Service: Orthopedics;  Laterality: Right;  . STUMP REVISION Right 12/05/2018   Procedure: Revision Right Above Knee Amputation;  Surgeon: [name], MD;  Location: [place];  Service: Orthopedics;  Laterality: Right;  . STUMP REVISION Right 05/29/2019   Procedure: REVISION RIGHT ABOVE KNEE AMPUTATION;  Surgeon: [name], MD;  Location: [place];  Service: Orthopedics;  Laterality: Right;  . STUMP REVISION Right 06/24/2019   Procedure: STUMP REVISION;  Surgeon: [name], MD;  Location: [place];  Service: Orthopedics;  Laterality: Right;    There were no vitals filed for this visit.   Subjective Assessment - 01/14/20 1105    Subjective I have been doing that compression and it works and then it comes right back. I have had alot of pain from this UTI.  No pump yet.    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    Patient Stated Goals to get his eye swelling down    Currently in Pain? Yes    Pain Score 5     Pain Location Leg    Pain Orientation Right    Pain Descriptors / Indicators Shooting;Stabbing    Pain Type Chronic pain     Pain Onset More than a month ago    Pain Frequency Constant                             [facility] Adult PT Treatment/Exercise - 01/14/20 0001      Manual Therapy   Manual therapy comments incision still not fully closed medially and appears unchanged overall    Edema Management emailed Vicente Males regarding pump status    Manual Lymphatic Drainage (MLD) short neck, bilateral axillary nodes, bilateral shoulder collectors, posterior, lateral and anterior neck moving fluid towards pathway at lateral neck, then jaw and lower face moving fluid towards pathway then majority of time spent at upper face  and around right eye moving fluid down face anterior to ear then down pathay on lateral neck then retracing all steps. Focused on area of edema and fibrosis just superior to cheek scar and interior to cheek scar. Did not do massage over scar because it is still healing                    PT Short Term Goals - 11/03/19 1621      PT SHORT TERM GOAL #1   Title Pt will be independent in self managment of periorbital lymphedema with self manual lymph drainage , compression and exercise    Baseline no knowledge             PT Long Term Goals - 12/25/19 1201      PT LONG TERM GOAL #1   Title Pt will iincrease right eye opening to 1.9 cm    Baseline 1.3 cm on 11/03/2019, 1.3 on 12/25/19    Status On-going      PT LONG TERM GOAL #2   Title Pt will be ind in self management of periorbital lymphedema and self manual lymph drainage , compression, and exercise    Time 4    Period Weeks    Status Partially Met                 Plan - 01/14/20 1146    Clinical Impression Statement Pt arrives with edema under the Rt eye returned to prior status with pt reporting if he doesn't do something on it 24/7 it just comes back.  Pt still has not received his pump so we will continue with CDT until pump independence.    PT Frequency 2x / week    PT Duration 6 weeks    PT  Treatment/Interventions ADLs/Self Care Home Management;Therapeutic exercise;Patient/family education;Orthotic Fit/Training;Manual lymph drainage;Taping;Compression bandaging    PT Next Visit Plan Continue performing MLD and reviewing same with pt for the Rt eye until pump arrives    PT Home Exercise Plan Self MLD    Consulted and Agree with Plan of Care Patient           Patient will benefit from skilled therapeutic intervention in order to improve the following deficits and impairments:     Visit Diagnosis: Lymphedema, not elsewhere classified  Abnormal posture     Problem List Patient Active Problem List   Diagnosis Date Noted  . Melanoma of cheek ([place]) 10/15/2019  . Acute respiratory failure with hypoxia ([place]) 07/11/2019  . Acute respiratory disease due to COVID-19 virus 07/11/2019  . Wound infection 06/22/2019  . Abscess 06/22/2019  . CKD (chronic kidney disease), stage III 05/23/2019  . Tobacco use 05/23/2019  . Sepsis ([place]) 05/23/2019  . S/P AKA (above knee amputation) unilateral, right ([place]) 12/05/2018  . Amputation stump infection ([place])   . Lymphedema of left leg   . Diabetic polyneuropathy associated with type 2 diabetes mellitus ([place])   . Above knee amputation of right lower extremity ([place]) 10/22/2018  . Dehiscence of amputation stump ([place])   . 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[place]) 08/12/2018  . Candidiasis 08/12/2018  . Necrosis of toe ([place]) 07/08/2018  . Amputated toe of left foot ([place]) 06/18/2018  . Streptococcal infection   . Pressure injury of skin 06/06/2018  . Cutaneous abscess of left foot   . Moderate protein-calorie malnutrition ([place])   . Cellulitis of left leg   . Septic arthritis ([place]) 06/03/2018  . Idiopathic chronic venous hypertension  of left lower extremity with ulcer and inflammation ([place]) 11/14/2017  . Great toe amputation status, left 11/14/2017  . Osteomyelitis ([place]) 03/25/2017  . Skin cancer 03/25/2017  . Depression, recurrent  ([place]) 11/05/2016  . Diabetic ulcer of toe of left foot ([place]) 10/02/2016  . 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 Chronic pain 09/27/2016  . Dyslipidemia associated with type 2 diabetes mellitus ([place]) 06/06/2016  . GERD without esophagitis 05/11/2016  . Hypertensive heart disease with congestive heart failure ([place]) 12/15/2015  . Spinal stenosis in cervical region 12/15/2015  . Type II diabetes mellitus with neurological manifestations ([place]) 12/15/2015  . Chronic diastolic CHF (congestive heart failure) ([place])   . Pulmonary hypertension ([place])   . Urinary retention   . Diskitis   . Foot drop, bilateral   . Sepsis with acute renal failure without septic shock ([place])   . MRSA bacteremia 10/03/2015  . COPD (chronic obstructive pulmonary disease) ([place]) 10/03/2015  . Acute osteomyelitis of left foot ([place]) 10/03/2015    Stark Bray 01/14/2020, 11:48 AM  [place], [place], [address] Phone: [phone]   Fax:  [phone]  Name: EAGLE PITTA MRN: [id number] Date of Birth: [birth year]

## 2020-01-18 ENCOUNTER — Ambulatory Visit: Payer: Medicaid Other | Attending: Radiation Oncology

## 2020-01-19 ENCOUNTER — Ambulatory Visit: Payer: Medicaid Other | Admitting: Rehabilitation

## 2020-02-05 ENCOUNTER — Telehealth: Payer: Self-pay | Admitting: Rehabilitation

## 2020-02-05 NOTE — Telephone Encounter (Signed)
Returned pt call x 2 and LM regarding vasopneumatic compression pump for lymphedema being denied due to living in a LTC facility.  Let pt know that the rep is working on any way this could be modified.

## 2020-02-24 ENCOUNTER — Inpatient Hospital Stay: Payer: Medicaid Other | Attending: Oncology

## 2020-02-24 ENCOUNTER — Other Ambulatory Visit: Payer: Self-pay

## 2020-02-24 DIAGNOSIS — C4339 Malignant melanoma of other parts of face: Secondary | ICD-10-CM

## 2020-02-24 DIAGNOSIS — Z79899 Other long term (current) drug therapy: Secondary | ICD-10-CM | POA: Insufficient documentation

## 2020-02-24 LAB — CBC WITH DIFFERENTIAL (CANCER CENTER ONLY)
Abs Immature Granulocytes: 0.04 10*3/uL (ref 0.00–0.07)
Basophils Absolute: 0.1 10*3/uL (ref 0.0–0.1)
Basophils Relative: 1 %
Eosinophils Absolute: 0.3 10*3/uL (ref 0.0–0.5)
Eosinophils Relative: 4 %
HCT: 33.8 % — ABNORMAL LOW (ref 39.0–52.0)
Hemoglobin: 10.7 g/dL — ABNORMAL LOW (ref 13.0–17.0)
Immature Granulocytes: 1 %
Lymphocytes Relative: 21 %
Lymphs Abs: 1.6 10*3/uL (ref 0.7–4.0)
MCH: 28.5 pg (ref 26.0–34.0)
MCHC: 31.7 g/dL (ref 30.0–36.0)
MCV: 89.9 fL (ref 80.0–100.0)
Monocytes Absolute: 0.3 10*3/uL (ref 0.1–1.0)
Monocytes Relative: 4 %
Neutro Abs: 5.3 10*3/uL (ref 1.7–7.7)
Neutrophils Relative %: 69 %
Platelet Count: 221 10*3/uL (ref 150–400)
RBC: 3.76 MIL/uL — ABNORMAL LOW (ref 4.22–5.81)
RDW: 15.3 % (ref 11.5–15.5)
WBC Count: 7.7 10*3/uL (ref 4.0–10.5)
nRBC: 0 % (ref 0.0–0.2)

## 2020-02-24 LAB — CMP (CANCER CENTER ONLY)
ALT: 8 U/L (ref 0–44)
AST: 8 U/L — ABNORMAL LOW (ref 15–41)
Albumin: 3.1 g/dL — ABNORMAL LOW (ref 3.5–5.0)
Alkaline Phosphatase: 94 U/L (ref 38–126)
Anion gap: 9 (ref 5–15)
BUN: 26 mg/dL — ABNORMAL HIGH (ref 6–20)
CO2: 27 mmol/L (ref 22–32)
Calcium: 9 mg/dL (ref 8.9–10.3)
Chloride: 101 mmol/L (ref 98–111)
Creatinine: 1.95 mg/dL — ABNORMAL HIGH (ref 0.61–1.24)
GFR, Est AFR Am: 42 mL/min — ABNORMAL LOW (ref >60)
GFR, Estimated: 36 mL/min — ABNORMAL LOW (ref >60)
Glucose, Bld: 224 mg/dL — ABNORMAL HIGH (ref 70–99)
Potassium: 4.5 mmol/L (ref 3.5–5.1)
Sodium: 137 mmol/L (ref 135–145)
Total Bilirubin: 0.3 mg/dL (ref 0.3–1.2)
Total Protein: 7.1 g/dL (ref 6.5–8.1)

## 2020-03-02 ENCOUNTER — Inpatient Hospital Stay (HOSPITAL_BASED_OUTPATIENT_CLINIC_OR_DEPARTMENT_OTHER): Payer: Medicaid Other | Admitting: Oncology

## 2020-03-02 ENCOUNTER — Other Ambulatory Visit: Payer: Self-pay

## 2020-03-02 VITALS — BP 144/58 | HR 62 | Temp 96.6°F | Resp 18 | Ht 72.0 in | Wt 235.0 lb

## 2020-03-02 DIAGNOSIS — C4339 Malignant melanoma of other parts of face: Secondary | ICD-10-CM

## 2020-03-02 NOTE — Progress Notes (Signed)
Hematology and Oncology Follow Up Visit  Gregory [name] [id number] 09/11/1960 59 y.o. 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   Prior Therapy:  Is status post surgical resection Dr. Crisoforo Oxford at [facility] in 2018.    Gregory [name] was noted to have a lesion on his right cheek in December 2020.  Biopsy obtained at that time which showed melanoma consistent with likely in-transit metastasis.  Gregory [name] underwent repeat surgical excision on August 24, 2019.  The final pathology showed a 2.8 cm neoplasm with positive margins.  The tumor showed spindled to epithelioid melanocyte with areas of necrosis and nuclear atypia and frequent mitotic figures.    Gregory [name] considered radiation therapy for a total of 48 Gray in 20 fractions to the right cheek but opted against it.   Current therapy: Active surveillance.  Interim History: Gregory [name] returns today for a follow-up visit.  Since last visit, Gregory [name] reports no major changes in his health.  Gregory [name] denies any increased swelling in his face or new masses or lesions.  Gregory [name] denies any nausea, vomiting or abdominal pain.  Gregory [name] denies any constitutional symptoms.  Formal status is limited because of his amputation but uses a motorized scooter for mobility.     Medications: I have reviewed the patient's current medications.  Current Outpatient Medications  Medication Sig Dispense Refill  . acetaminophen (TYLENOL) 325 MG tablet Take 650 mg by mouth every 4 (four) hours as needed (general discomfort).     Marland Kitchen albuterol (PROVENTIL HFA;VENTOLIN HFA) 108 (90 Base) MCG/ACT inhaler Inhale 2 puffs into the lungs every 6 (six) hours as needed for wheezing or shortness of breath.    Marland Kitchen alum & mag hydroxide-simeth (MAALOX PLUS) 400-400-40 MG/5ML suspension Take by mouth.    Marland Kitchen amLODipine  (NORVASC) 10 MG tablet Take 10 mg by mouth daily. for HTN    . Baclofen 5 MG TABS Take 5 mg by mouth 3 (three) times daily.     Marland Kitchen buPROPion (WELLBUTRIN XL) 300 MG 24 hr tablet Take 300 mg by mouth at bedtime.    . cetirizine (ZYRTEC) 10 MG tablet Take 10 mg by mouth daily.    . Cholecalciferol (VITAMIN D) 50 MCG (2000 UT) tablet Take 2,000 Units by mouth daily.    . Cholecalciferol (VITAMIN D-3) 125 MCG (5000 UT) TABS Take 1 tablet by mouth daily.    . Cholecalciferol (VITAMIN D-3) 5000 units TABS Take 5,000 Units by mouth daily.    . diphenhydrAMINE (SOMINEX) 25 MG tablet Take by mouth.    . docusate sodium (COLACE) 100 MG capsule Take 1 capsule (100 mg total) by mouth 2 (two) times daily. 10 capsule 0  . eszopiclone (LUNESTA) 1 MG TABS tablet Take 1 mg by mouth at bedtime. Take immediately before bedtime    . ferrous sulfate 325 (65 FE) MG tablet Take 1 tablet (325 mg total) by mouth daily with breakfast. 30 tablet 0  . finasteride (PROSCAR) 5 MG tablet Take 5 mg by mouth daily.    . furosemide (LASIX) 40 MG tablet Take by mouth.    . gabapentin (NEURONTIN) 100 MG capsule Take by mouth.    Marland Kitchen glipiZIDE (GLUCOTROL) 10 MG tablet Take 10 mg by mouth daily before breakfast.    . ibuprofen (ADVIL) 800 MG tablet Take 800  mg by mouth 3 (three) times daily.    . insulin aspart (NOVOLOG) 100 UNIT/ML injection Inject 0-9 Units into the skin 3 (three) times daily with meals. 8.1 mL 0  . insulin glargine (LANTUS) 100 UNIT/ML injection Inject into the skin.    Marland Kitchen insulin lispro (HUMALOG KWIKPEN) 100 UNIT/ML KwikPen Inject 16 Units into the skin 3 (three) times daily before meals.     . lidocaine (LIDODERM) 5 % Place 1 patch onto the skin daily. Apply to Right stump. Remove & Discard patch within 12 hours or as directed by MD    . LYRICA 300 MG capsule Take 300 mg by mouth 2 (two) times daily.    . magnesium oxide (MAG-OX) 400 MG tablet Take 400 mg by mouth 2 (two) times daily.    . metoprolol tartrate  (LOPRESSOR) 25 MG tablet Take 0.5 tablets (12.5 mg total) by mouth 2 (two) times daily. 30 tablet 0  . mirabegron ER (MYRBETRIQ) 50 MG TB24 tablet Take 50 mg by mouth daily.    . mometasone (NASONEX) 50 MCG/ACT nasal spray Place 2 sprays into the nose at bedtime.     . naloxone (NARCAN) 4 MG/0.1ML LIQD nasal spray kit Place into the nose.    . nicotine (NICODERM CQ - DOSED IN MG/24 HOURS) 14 mg/24hr patch Place 1 patch (14 mg total) onto the skin at bedtime. 28 patch 0  . omeprazole (PRILOSEC) 20 MG capsule Take 40 mg by mouth daily before breakfast.     . ondansetron (ZOFRAN) 4 MG tablet Take by mouth.    . oxybutynin (DITROPAN-XL) 10 MG 24 hr tablet Take 10 mg by mouth at bedtime.    Marland Kitchen oxycodone (ROXICODONE) 30 MG immediate release tablet Take by mouth.    Marland Kitchen PARoxetine (PAXIL) 10 MG tablet Take 10 mg by mouth at bedtime.     . promethazine (PHENERGAN) 25 MG tablet Take by mouth.    . rosuvastatin (CRESTOR) 10 MG tablet Take 10 mg by mouth at bedtime.    . senna-docusate (SENOKOT-S) 8.6-50 MG tablet Take 2 tablets by mouth at bedtime.    . tamsulosin (FLOMAX) 0.4 MG CAPS capsule Take 0.4 mg by mouth every evening.     . terbinafine (LAMISIL) 250 MG tablet Take 250 mg by mouth daily.    . Tiotropium Bromide Monohydrate (SPIRIVA RESPIMAT) 1.25 MCG/ACT AERS Inhale 2 puffs into the lungs daily.    . vitamin B-12 (CYANOCOBALAMIN) 500 MCG tablet Take 1,000 mcg by mouth daily.     . vitamin C (ASCORBIC ACID) 250 MG tablet Take 500 mg by mouth 2 (two) times daily.    Marland Kitchen zinc sulfate (ZINC-220) 220 (50 Zn) MG capsule Take 220 mg by mouth daily.     No current facility-administered medications for this visit.     Allergies: No Active Allergies  Past Medical History, Surgical history, Social history, and Family History were reviewed and updated.    Physical Exam: Blood pressure (!) 144/58, pulse 62, temperature (!) 96.6 F (35.9 C), temperature source Tympanic, resp. rate 18, height 6' (1.829  m), weight 235 lb (106.6 kg), SpO2 97 %.   ECOG: 1    General appearance: Comfortable appearing without any discomfort Head: Normocephalic without any trauma Oropharynx: Mucous membranes are moist and pink without any thrush or ulcers. Eyes: Pupils are equal and round reactive to light. Lymph nodes: No cervical, supraclavicular, inguinal or axillary lymphadenopathy.   Heart:regular rate and rhythm.  S1 and S2 without leg edema.  Lung: Clear without any rhonchi or wheezes.  No dullness to percussion. Abdomin: Soft, nontender, nondistended with good bowel sounds.  No hepatosplenomegaly. Musculoskeletal: Right above-the-knee amputation noted. Neurological: No deficits noted on motor, sensory and deep tendon reflex exam. Skin: Erythema noted on his left lower leg.    Lab Results: Lab Results  Component Value Date   WBC 7.7 02/24/2020   HGB 10.7 (L) 02/24/2020   HCT 33.8 (L) 02/24/2020   MCV 89.9 02/24/2020   PLT 221 02/24/2020     Chemistry      Component Value Date/Time   NA 137 02/24/2020 0938   NA 138 10/22/2016 0000   K 4.5 02/24/2020 0938   CL 101 02/24/2020 0938   CO2 27 02/24/2020 0938   BUN 26 (H) 02/24/2020 0938   BUN 26 (A) 10/22/2016 0000   CREATININE 1.95 (H) 02/24/2020 0938   CREATININE 2.67 (H) 10/02/2016 1651   GLU 154 10/22/2016 0000      Component Value Date/Time   CALCIUM 9.0 02/24/2020 0938   ALKPHOS 94 02/24/2020 0938   AST 8 (L) 02/24/2020 0938   ALT 8 02/24/2020 0938   BILITOT 0.3 02/24/2020 0938         Impression and Plan:  59 year old man with:  1.    Stage II.  Cutaneous melanoma of the right face diagnosed in 2018.  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   Gregory [name] has no evidence of distant metastasis.   The natural course of this disease was reviewed today and treatment options were discussed.  Continued active surveillance versus adjuvant immunotherapy options were discussed.  Risks and benefits of monthly nivolumab to  complete 12 months were discussed.  Complications including immune mediated complication occluding pneumonitis, colitis and hepatitis of thyroid disease were reviewed.  Alternatively active surveillance would be an alternative with periodic monitoring.  After discussion today, Gregory [name] opted with continuous monitoring for the time being.  I will arrange for a PET scan which was not scheduled to be completed in the near future.      2.  Follow-up: In 4 months for repeat follow-up.   30  minutes were spent on this encounter.  The time was dedicated to updating his disease status, discussing treatment options and addressing complications with therapy.     Zola Button, MD 9/15/20211:31 PM

## 2020-03-15 ENCOUNTER — Ambulatory Visit: Payer: Medicaid Other | Admitting: Physical Therapy

## 2020-03-15 ENCOUNTER — Other Ambulatory Visit: Payer: Self-pay

## 2020-03-17 ENCOUNTER — Other Ambulatory Visit: Payer: Self-pay

## 2020-03-17 ENCOUNTER — Encounter: Payer: Self-pay | Admitting: Internal Medicine

## 2020-03-17 ENCOUNTER — Ambulatory Visit (INDEPENDENT_AMBULATORY_CARE_PROVIDER_SITE_OTHER): Payer: Medicaid Other | Admitting: Internal Medicine

## 2020-03-17 VITALS — BP 159/72 | HR 73 | Temp 98.1°F

## 2020-03-17 DIAGNOSIS — I890 Lymphedema, not elsewhere classified: Secondary | ICD-10-CM | POA: Diagnosis not present

## 2020-03-17 DIAGNOSIS — Z720 Tobacco use: Secondary | ICD-10-CM

## 2020-03-17 DIAGNOSIS — Z794 Long term (current) use of insulin: Secondary | ICD-10-CM

## 2020-03-17 DIAGNOSIS — E1151 Type 2 diabetes mellitus with diabetic peripheral angiopathy without gangrene: Secondary | ICD-10-CM | POA: Diagnosis not present

## 2020-03-17 NOTE — Patient Instructions (Addendum)
It was nice meeting you today   You have chronic lymphedema (or swelling). This had lead to hardening of your skin and irritation. However, it doesn't appear you have had an active cellulitis (skin infection at this time).   With chronic swelling, your skin can get irritated (red and uncomfortable). Use triamcinolone cream 1% once a day when this happen  Infection is signified by rapidly spreading redness, severe pain, fever, chill, then call our clinic   For underlying leg swelling, you will need to exercise your leg, elevate it above your heart level, and continue with Christoper Allegra daily.

## 2020-03-17 NOTE — Progress Notes (Signed)
Subjective:    Patient ID: Gregory [name], male    DOB: 1961/06/09, 59 y.o.   MRN: [id number]  Chief Complaint  Patient presents with  . New Patient (Initial Visit)    (Cellulitis, right AKA, L great Toe Amputation     HPI:  Gregory [name] is a 59 y.o. 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 He describeds bump with ulcerations on the anteriorior right LE. He denies episodes of fever, chill, spreading redness, or pain/acute swelling  He had superficial swab that showed mrsa (R tetra, bactrim), among various gnr. He just underwent a triple abx course recewntly but don't remember what. linezolid was used recent past  Per his pcp note, he is not compliant with una boot and endorsed picking his legs   He feels well otherwise.      No Known Allergies    Outpatient Medications Prior to Visit  Medication Sig Dispense Refill  . acetaminophen (TYLENOL) 325 MG tablet Take 650 mg by mouth every 4 (four) hours as needed (general discomfort).     Marland Kitchen albuterol (PROVENTIL HFA;VENTOLIN HFA) 108 (90 Base) MCG/ACT inhaler Inhale 2 puffs into the lungs every 6 (six) hours as needed for wheezing or shortness of breath.    Marland Kitchen alum & mag hydroxide-simeth (MAALOX PLUS) 400-400-40 MG/5ML suspension Take by mouth.    Marland Kitchen amLODipine (NORVASC) 10 MG tablet Take 10 mg by mouth daily. for HTN    . Baclofen 5 MG TABS Take 5 mg by mouth 3 (three) times daily.     Marland Kitchen buPROPion (WELLBUTRIN XL) 300 MG 24 hr tablet Take 300 mg by mouth at bedtime.    . cetirizine (ZYRTEC) 10 MG tablet Take 10 mg by mouth daily.    . Cholecalciferol (VITAMIN D) 50 MCG (2000 UT) tablet Take 2,000 Units by  mouth daily.    . Cholecalciferol (VITAMIN D-3) 125 MCG (5000 UT) TABS Take 1 tablet by mouth daily.    . Cholecalciferol (VITAMIN D-3) 5000 units TABS Take 5,000 Units by mouth daily.    . diphenhydrAMINE (SOMINEX) 25 MG tablet Take by mouth.    . docusate sodium (COLACE) 100 MG capsule Take 1 capsule (100 mg total) by mouth 2 (two) times daily. 10 capsule 0  . eszopiclone (LUNESTA) 1 MG TABS tablet Take 1 mg by mouth at bedtime. Take immediately before bedtime    . ferrous sulfate 325 (65 FE) MG tablet Take 1 tablet (325 mg total) by mouth daily with breakfast. 30 tablet 0  . finasteride (PROSCAR) 5 MG tablet Take 5 mg by mouth daily.    . furosemide (LASIX) 40 MG tablet Take by mouth.    . gabapentin (NEURONTIN) 100 MG capsule Take by mouth.    Marland Kitchen glipiZIDE (GLUCOTROL) 10 MG tablet Take 10 mg by mouth daily before breakfast.    . ibuprofen (ADVIL) 800 MG tablet Take 800 mg by mouth 3 (three) times daily.    . insulin glargine (LANTUS) 100 UNIT/ML injection Inject into the skin.    Marland Kitchen insulin lispro (HUMALOG KWIKPEN) 100 UNIT/ML KwikPen Inject 16 Units into the skin 3 (three) times daily before meals.     . lidocaine (  LIDODERM) 5 % Place 1 patch onto the skin daily. Apply to Right stump. Remove & Discard patch within 12 hours or as directed by MD    . LYRICA 300 MG capsule Take 300 mg by mouth 2 (two) times daily.    . magnesium oxide (MAG-OX) 400 MG tablet Take 400 mg by mouth 2 (two) times daily.    . mirabegron ER (MYRBETRIQ) 50 MG TB24 tablet Take 50 mg by mouth daily.    . mometasone (NASONEX) 50 MCG/ACT nasal spray Place 2 sprays into the nose at bedtime.     . naloxone (NARCAN) 4 MG/0.1ML LIQD nasal spray kit Place into the nose.    . nicotine (NICODERM CQ - DOSED IN MG/24 HOURS) 14 mg/24hr patch Place 1 patch (14 mg total) onto the skin at bedtime. 28 patch 0  . omeprazole (PRILOSEC) 20 MG capsule Take 40 mg by mouth daily before breakfast.     . ondansetron (ZOFRAN) 4 MG tablet Take by  mouth.    . oxybutynin (DITROPAN-XL) 10 MG 24 hr tablet Take 10 mg by mouth at bedtime.    Marland Kitchen oxycodone (ROXICODONE) 30 MG immediate release tablet Take by mouth.    Marland Kitchen PARoxetine (PAXIL) 10 MG tablet Take 10 mg by mouth at bedtime.     . promethazine (PHENERGAN) 25 MG tablet Take by mouth.    . rosuvastatin (CRESTOR) 10 MG tablet Take 10 mg by mouth at bedtime.    . senna-docusate (SENOKOT-S) 8.6-50 MG tablet Take 2 tablets by mouth at bedtime.    . tamsulosin (FLOMAX) 0.4 MG CAPS capsule Take 0.4 mg by mouth every evening.     . terbinafine (LAMISIL) 250 MG tablet Take 250 mg by mouth daily.    . Tiotropium Bromide Monohydrate (SPIRIVA RESPIMAT) 1.25 MCG/ACT AERS Inhale 2 puffs into the lungs daily.    . vitamin B-12 (CYANOCOBALAMIN) 500 MCG tablet Take 1,000 mcg by mouth daily.     . vitamin C (ASCORBIC ACID) 250 MG tablet Take 500 mg by mouth 2 (two) times daily.    Marland Kitchen zinc sulfate (ZINC-220) 220 (50 Zn) MG capsule Take 220 mg by mouth daily.    . insulin aspart (NOVOLOG) 100 UNIT/ML injection Inject 0-9 Units into the skin 3 (three) times daily with meals. 8.1 mL 0  . metoprolol tartrate (LOPRESSOR) 25 MG tablet Take 0.5 tablets (12.5 mg total) by mouth 2 (two) times daily. 30 tablet 0   No facility-administered medications prior to visit.     Past Medical History:  Diagnosis Date  . Acquired absence of left great toe ([place])   . Acquired absence of right leg below knee ([place])   . Acute osteomyelitis of calcaneum, right ([place]) 10/02/2016  . Acute respiratory failure ([place])   . Amputation stump infection ([place]) 08/12/2018  . Anemia   . Basal cell carcinoma, eyelid   . Cancer ([place])   . Candidiasis 08/12/2018  . CHF (congestive heart failure) (HCC)    chronic diastolic   . Chronic back pain   . Chronic kidney disease    stage 3   . Chronic multifocal osteomyelitis (HCC)    of ankle and foot   . Chronic pain syndrome   . COPD (chronic obstructive pulmonary disease) ([place])   . DDD  (degenerative disc disease), lumbar   . Depression    major depressive disorder   . Diabetes mellitus without complication ([place])    type 2   . Diabetic ulcer of toe of  left foot ([place]) 10/02/2016  . Difficulty in walking, not elsewhere classified   . 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 Foot drop, bilateral   . Foot drop, right 12/27/2015  . GERD (gastroesophageal reflux disease)   . Herpesviral vesicular dermatitis   . Hyperlipidemia   . Hyperlipidemia   . Hypertension   . Insomnia   . Malignant melanoma of other parts of face ([place])   . MRSA bacteremia   . Muscle weakness (generalized)   . Nasal congestion   . Necrosis of toe ([place]) 07/08/2018  . Neuromuscular dysfunction of bladder   . Osteomyelitis ([place])   . Osteomyelitis ([place])   . Osteomyelitis ([place])    right BKA  . Other idiopathic peripheral autonomic neuropathy   . Retention of urine, unspecified   . Squamous cell carcinoma of skin   . Unsteadiness on feet   . Urinary retention   . Urinary retention   . Urinary tract infection   . Wears dentures   . Wears glasses       Past Surgical History:  Procedure Laterality Date  . AMPUTATION Left 03/29/2017   Procedure: LEFT GREAT TOE AMPUTATION AT METATARSOPHALANGEAL JOINT;  Surgeon: [name], MD;  Location: [facility];  Service: Orthopedics;  Laterality: Left;  . AMPUTATION Right 03/29/2017   Procedure: RIGHT BELOW KNEE AMPUTATION;  Surgeon: [name], MD;  Location: [facility];  Service: Orthopedics;  Laterality: Right;  . AMPUTATION Left 06/06/2018   Procedure: LEFT 2ND TOE AMPUTATION;  Surgeon: [name], MD;  Location: [facility];  Service: Orthopedics;  Laterality: Left;  . AMPUTATION Right 11/19/2018   Procedure: AMPUTATION ABOVE KNEE;  Surgeon: [name], MD;  Location: [place];  Service: Orthopedics;  Laterality: Right;  . ANTERIOR CERVICAL CORPECTOMY N/A 11/25/2015   Procedure: ANTERIOR CERVICAL FIVE CORPECTOMY Cervical four - six fusion;  Surgeon: Consuella Lose, MD;   Location: [facility];  Service: Neurosurgery;  Laterality: N/A;  ANTERIOR CERVICAL FIVE CORPECTOMY Cervical four - six fusion  . APPLICATION OF WOUND VAC Right 10/22/2018   Procedure: Application Of Wound Vac;  Surgeon: [name], MD;  Location: [facility] [facility];  Service: Orthopedics;  Laterality: Right;  . CYSTOSCOPY WITH RETROGRADE URETHROGRAM N/A 04/25/2018   Procedure: CYSTOSCOPY WITH RETROGRADE URETHROGRAM/ BALLOON DILATION;  Surgeon: Kathie Rhodes, MD;  Location[facility];  Service: Urology;  Laterality: N/A;  . MULTIPLE TOOTH EXTRACTIONS    . POSTERIOR CERVICAL FUSION/FORAMINOTOMY N/A 11/29/2015   Procedure: Cervical Three-Cervical Seven Posterior Cervical Laminectomy with Fusion;  Surgeon: Consuella Lose, MD;  Location: [facility];  Service: Neurosurgery;  Laterality: N/A;  Cervical Three-Cervical Seven Posterior Cervical Laminectomy with Fusion  . STUMP REVISION Right 10/22/2018   Procedure: REVISION RIGHT BELOW KNEE AMPUTATION;  Surgeon: [name], MD;  Location: [facility];  Service: Orthopedics;  Laterality: Right;  . STUMP REVISION Right 12/05/2018   Procedure: Revision Right Above Knee Amputation;  Surgeon: [name], MD;  Location: [facility];  Service: Orthopedics;  Laterality: Right;  . STUMP REVISION Right 05/29/2019   Procedure: REVISION RIGHT ABOVE KNEE AMPUTATION;  Surgeon: [name], MD;  Location: [facility];  Service: Orthopedics;  Laterality: Right;  . STUMP REVISION Right 06/24/2019   Procedure: STUMP REVISION;  Surgeon: [name], MD;  Location: [facility];  Service: Orthopedics;  Laterality: Right;      Family History  Problem Relation Age of Onset  . Cancer Other   . Diabetes Other   .  Bone cancer Father   . Prostate cancer Father   . Cancer Paternal Grandfather       Social History   Socioeconomic History  . Marital status: Single    Spouse name: Not on file  . Number of children: Not on file  . Years of education: Not on file  . Highest education level:  Not on file  Occupational History  . Not on file  Tobacco Use  . Smoking status: Current Every Day Smoker    Packs/day: 0.50    Types: Cigarettes  . Smokeless tobacco: Never Used  . Tobacco comment: 11/19/18  Vaping Use  . Vaping Use: Never used  Substance and Sexual Activity  . Alcohol use: No  . Drug use: No  . Sexual activity: Not on file  Other Topics Concern  . Not on file  Social History Narrative  . Not on file   Social Determinants of Health   Financial Resource Strain:   . Difficulty of Paying Living Expenses: Not on file  Food Insecurity:   . Worried About Charity fundraiser in the Last Year: Not on file  . Ran Out of Food in the Last Year: Not on file  Transportation Needs:   . Lack of Transportation (Medical): Not on file  . Lack of Transportation (Non-Medical): Not on file  Physical Activity:   . Days of Exercise per Week: Not on file  . Minutes of Exercise per Session: Not on file  Stress:   . Feeling of Stress : Not on file  Social Connections:   . Frequency of Communication with Friends and Family: Not on file  . Frequency of Social Gatherings with Friends and Family: Not on file  . Attends Religious Services: Not on file  . Active Member of Clubs or Organizations: Not on file  . Attends Archivist Meetings: Not on file  . Marital Status: Not on file  Intimate Partner Violence:   . Fear of Current or Ex-Partner: Not on file  . Emotionally Abused: Not on file  . Physically Abused: Not on file  . Sexually Abused: Not on file      Review of Systems   negative 11 point ros unless mentioned above  Objective:    BP (!) 159/72   Pulse 73   Temp 98.1 F (36.7 C) (Oral)  Nursing note and vital signs reviewed.  Physical Exam     Pleasant well developed gentleman in electric wheel chair Heent: normocephalic, atraumatic; lymphedema periorbital right eye Neck supple cv rrr no mrg Lungs clear abd s/nt Ext s/p right aka; lle with brawny  edema, hyperkeratosis regions, several nodules/papules anterior chin with superficial ulceration/plaque; no erythema; there is serous and fibrinous exudate out of some ulcerated bump.  Neuro in wheel chair, cn2-12 function intact, strength LLE 5/5 Psych alert/oriented Skin periorbital edema right eye chronic  Labs: none  Assessment & Plan:   Patient Active Problem List   Diagnosis Date Noted  . Melanoma of cheek ([place]) 10/15/2019  . Acute respiratory failure with hypoxia ([place]) 07/11/2019  . Acute respiratory disease due to COVID-19 virus 07/11/2019  . Wound infection 06/22/2019  . Abscess 06/22/2019  . CKD (chronic kidney disease), stage III 05/23/2019  . Tobacco use 05/23/2019  . Sepsis ([place]) 05/23/2019  . S/P AKA (above knee amputation) unilateral, right ([place]) 12/05/2018  . Amputation stump infection ([place])   . Lymphedema of left leg   . Diabetic polyneuropathy associated with type 2 diabetes mellitus (  [place])   . Above knee amputation of right lower extremity ([place]) 10/22/2018  . Dehiscence of amputation stump ([place])   . 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[place]) 08/12/2018  . Candidiasis 08/12/2018  . Necrosis of toe ([place]) 07/08/2018  . Amputated toe of left foot ([place]) 06/18/2018  . Streptococcal infection   . Pressure injury of skin 06/06/2018  . Cutaneous abscess of left foot   . Moderate protein-calorie malnutrition ([place])   . Cellulitis of left leg   . Septic arthritis ([place]) 06/03/2018  . Idiopathic chronic venous hypertension of left lower extremity with ulcer and inflammation ([place]) 11/14/2017  . Great toe amputation status, left 11/14/2017  . Osteomyelitis ([place]) 03/25/2017  . Skin cancer 03/25/2017  . Depression, recurrent ([place]) 11/05/2016  . Diabetic ulcer of toe of left foot ([place]) 10/02/2016  . 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 Chronic pain 09/27/2016  . Dyslipidemia associated with type 2 diabetes mellitus ([place]) 06/06/2016  . GERD without esophagitis 05/11/2016  .  Hypertensive heart disease with congestive heart failure ([place]) 12/15/2015  . Spinal stenosis in cervical region 12/15/2015  . Type II diabetes mellitus with neurological manifestations ([place]) 12/15/2015  . Chronic diastolic CHF (congestive heart failure) ([place])   . Pulmonary hypertension ([place])   . Urinary retention   . Diskitis   . Foot drop, bilateral   . Sepsis with acute renal failure without septic shock ([place])   . MRSA bacteremia 10/03/2015  . COPD (chronic obstructive pulmonary disease) ([place]) 10/03/2015  . Acute osteomyelitis of left foot ([place]) 10/03/2015     Problem List Items Addressed This Visit    None    Visit Diagnoses    Lymphedema    -  Primary   Type 2 diabetes mellitus with diabetic peripheral angiopathy without gangrene, with long-term current use of insulin (HCC)       Tobacco abuse         -no active infection; precaution given for such episode and to return to clinic as needed -encourage una boot -kenalog cream 1% for use as needed if evidence of venous stasis dermatitis (swollen, well demarcated erythema, lack of fever, chill or spreading redness or increasing pain)     I am having Gregory [name] maintain his tamsulosin, vitamin B-12, Vitamin D-3, finasteride, glipiZIDE, PARoxetine, senna-docusate, acetaminophen, albuterol, cetirizine, insulin lispro, magnesium oxide, buPROPion, lidocaine, mometasone, eszopiclone, mirabegron ER, omeprazole, Tiotropium Bromide Monohydrate, terbinafine, amLODipine, ferrous sulfate, docusate sodium, Lyrica, Baclofen, ibuprofen, nicotine, oxybutynin, rosuvastatin, Vitamin D-3, Vitamin D, zinc sulfate, vitamin C, metoprolol tartrate, insulin aspart, promethazine, oxycodone, ondansetron, Narcan, insulin glargine, gabapentin, furosemide, diphenhydrAMINE, and alum & mag hydroxide-simeth.   No orders of the defined types were placed in this encounter.  Discharge Instructions   None       Follow-up: Return if symptoms worsen or fail  to improve.    Osker Mason, [facility] [place] Main number: [phone]

## 2020-03-18 ENCOUNTER — Telehealth: Payer: Self-pay

## 2020-03-18 DIAGNOSIS — I872 Venous insufficiency (chronic) (peripheral): Secondary | ICD-10-CM

## 2020-03-18 NOTE — Telephone Encounter (Signed)
Received called today from April with [place] Dezza at [facility] to follow up on prescription for triamcinolone cream 1%. Will forward message to provider for prescription. Will forward to SNF once prescription is printed and signed. [place]

## 2020-03-22 NOTE — Telephone Encounter (Signed)
Dr Gale Journey will be in clinic 10/6 to print/sign prescription for SNF. 'CC triage pool. Landis Gandy, RN

## 2020-03-23 ENCOUNTER — Other Ambulatory Visit: Payer: Self-pay

## 2020-03-23 ENCOUNTER — Ambulatory Visit: Payer: Medicaid Other | Attending: Radiation Oncology | Admitting: Physical Therapy

## 2020-03-23 ENCOUNTER — Encounter: Payer: Self-pay | Admitting: Physical Therapy

## 2020-03-23 DIAGNOSIS — I890 Lymphedema, not elsewhere classified: Secondary | ICD-10-CM | POA: Diagnosis present

## 2020-03-23 DIAGNOSIS — Z483 Aftercare following surgery for neoplasm: Secondary | ICD-10-CM | POA: Diagnosis present

## 2020-03-23 MED ORDER — TRIAMCINOLONE ACETONIDE 0.1 % EX CREA: 1.0000 "application " | TOPICAL_CREAM | Freq: Two times a day (BID) | CUTANEOUS | 2 refills | Status: DC

## 2020-03-23 NOTE — Telephone Encounter (Signed)
Dr Gale Journey signed printed prescription, gave to [facility] for fax to SNF.

## 2020-03-23 NOTE — Therapy (Signed)
Gregory [name], [place], [address] Phone: [phone]   Fax:  [phone]  Physical Therapy Re-Evaluation  Patient Details  Name: Gregory [name] MRN: [id number] Date of Birth: 03/01/1961 Referring Provider (PT): Shona Simpson    Encounter Date: 03/23/2020   PT End of Session - 03/23/20 2111    Visit Number 1    Number of Visits 12    Date for PT Re-Evaluation 05/11/20    Authorization Type Medicaid    PT Start Time 1300    PT Stop Time 0814    PT Time Calculation (min) 45 min    Activity Tolerance Treatment limited secondary to medical complications (Comment)   Pt got dizzy, feeling like it was low blood sugar   Behavior During Therapy Impulsive   pt bothered as he did not feel well          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)    Acquired absence of right leg below knee ([place])    Acute osteomyelitis of calcaneum, right ([place]) 10/02/2016   Acute respiratory failure (HCC)    Amputation stump infection ([place]) 08/12/2018   Anemia    Basal cell carcinoma, eyelid    Cancer ([place])    Candidiasis 08/12/2018   CHF (congestive heart failure) (HCC)    chronic diastolic    Chronic back pain    Chronic kidney disease    stage 3    Chronic multifocal osteomyelitis (HCC)    of ankle and foot    Chronic pain syndrome    COPD (chronic obstructive pulmonary disease) ([place])    DDD (degenerative disc disease), lumbar    Depression    major depressive disorder    Diabetes mellitus without complication ([place])    type 2    Diabetic ulcer of toe of left foot ([place]) 10/02/2016   Difficulty in walking, not elsewhere classified    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   Foot drop, bilateral    Foot drop, right 12/27/2015   GERD (gastroesophageal reflux disease)    Herpesviral vesicular dermatitis    Hyperlipidemia    Hyperlipidemia    Hypertension     Insomnia    Malignant melanoma of other parts of face ([place])    MRSA bacteremia    Muscle weakness (generalized)    Nasal congestion    Necrosis of toe ([place]) 07/08/2018   Neuromuscular dysfunction of bladder    Osteomyelitis ([place])    Osteomyelitis ([place])    Osteomyelitis ([place])    right BKA   Other idiopathic peripheral autonomic neuropathy    Retention of urine, unspecified    Squamous cell carcinoma of skin    Unsteadiness on feet    Urinary retention    Urinary retention    Urinary tract infection    Wears dentures    Wears glasses     Past Surgical History:  Procedure Laterality Date   AMPUTATION Left 03/29/2017   Procedure: LEFT GREAT TOE AMPUTATION AT METATARSOPHALANGEAL JOINT;  Surgeon: [name], MD;  Location: [facility];  Service: Orthopedics;  Laterality: Left;   AMPUTATION Right 03/29/2017   Procedure: RIGHT BELOW KNEE AMPUTATION;  Surgeon: [name], MD;  Location: [facility];  Service: Orthopedics;  Laterality: Right;   AMPUTATION Left 06/06/2018   Procedure: LEFT 2ND TOE AMPUTATION;  Surgeon: [name], MD;  Location: [facility];  Service: Orthopedics;  Laterality: Left;   AMPUTATION Right  11/19/2018   Procedure: AMPUTATION ABOVE KNEE;  Surgeon: [name], MD;  Location: [facility];  Service: Orthopedics;  Laterality: Right;   ANTERIOR CERVICAL CORPECTOMY N/A 11/25/2015   Procedure: ANTERIOR CERVICAL FIVE CORPECTOMY Cervical four - six fusion;  Surgeon: Consuella Lose, MD;  Location: [facility];  Service: Neurosurgery;  Laterality: N/A;  ANTERIOR CERVICAL FIVE CORPECTOMY Cervical four - six fusion   APPLICATION OF WOUND VAC Right 10/22/2018   Procedure: Application Of Wound Vac;  Surgeon: [name], MD;  Location: [facility];  Service: Orthopedics;  Laterality: Right;   CYSTOSCOPY WITH RETROGRADE URETHROGRAM N/A 04/25/2018   Procedure: CYSTOSCOPY WITH RETROGRADE URETHROGRAM/ BALLOON DILATION;  Surgeon: Kathie Rhodes, MD;  Location: [facility];   Service: Urology;  Laterality: N/A;   MULTIPLE TOOTH EXTRACTIONS     POSTERIOR CERVICAL FUSION/FORAMINOTOMY N/A 11/29/2015   Procedure: Cervical Three-Cervical Seven Posterior Cervical Laminectomy with Fusion;  Surgeon: Consuella Lose, MD;  Location: [facility];  Service: Neurosurgery;  Laterality: N/A;  Cervical Three-Cervical Seven Posterior Cervical Laminectomy with Fusion   STUMP REVISION Right 10/22/2018   Procedure: REVISION RIGHT BELOW KNEE AMPUTATION;  Surgeon: [name], MD;  Location: [facility];  Service: Orthopedics;  Laterality: Right;   STUMP REVISION Right 12/05/2018   Procedure: Revision Right Above Knee Amputation;  Surgeon: [name], MD;  Location: [facility];  Service: Orthopedics;  Laterality: Right;   STUMP REVISION Right 05/29/2019   Procedure: REVISION RIGHT ABOVE KNEE AMPUTATION;  Surgeon: [name], MD;  Location: [facility];  Service: Orthopedics;  Laterality: Right;   STUMP REVISION Right 06/24/2019   Procedure: STUMP REVISION;  Surgeon: [name], MD;  Location: [facility];  Service: Orthopedics;  Laterality: Right;    There were no vitals filed for this visit.    Subjective Assessment - 03/23/20 1306    Subjective Pt says that he has a patch the his daughter bought for hime that he wears sometimes that helps decrease the swelling but it just moves down onto his cheek and comes right back. He says his doctor needs the pump and wants to know if there is something else he needs to get it. He wants to know if there is something more he can do. He says that he is blind in his left eye and can't see well out of the right eye because of the swelling    Pertinent History H/x of Melanoma s/p neck dissection and right eyebrow excision in 2018, and local recurrence in right cheek, and since surgery has had periorbital edema,  Probable lymphedema.Past histroy includes blindness in the left eye, DM with complications for ight AKA with multiple revisions, Left frst and second  toe amputation.    Patient Stated Goals to get his eye swelling down    Currently in Pain? Yes    Pain Score 8    pt is constantly rubbing his right residual limb   Pain Location Leg    Pain Orientation Right    Pain Descriptors / Indicators Sharp    Pain Type Chronic pain    Pain Onset More than a month ago              [facility] PT Assessment - 03/23/20 0001      Assessment   Medical Diagnosis melanoma of cheek    Referring Provider (PT) Shona Simpson     Onset Date/Surgical Date 09/07/19   previous surgery in 2018      Prior  Function   Level of Independence Requires assistive device for independence   pt states he is mostly independent at his facility             LYMPHEDEMA/ONCOLOGY QUESTIONNAIRE - 03/23/20 0001      Lymphedema Assessments   Lymphedema Assessments --   right eye open 1.5 cm left eye open 1.8 cm     Head and Neck   Other where tragus of ear meets face over fullness of skin under eye  to corner of eye on right 14.4cm,  on left ;13.2                   Objective measurements completed on examination: See above findings.       [facility] Adult PT Treatment/Exercise - 03/23/20 0001      Manual Therapy   Manual Therapy Edema management;Manual Lymphatic Drainage (MLD)    Manual therapy comments at beginning of session, pt needed to go to bathroom, then stated he did not feel well, like his 'sugar was dropping' He drank some of the coke he brought and had a piece of candy and wanted to proceed with treatment     Edema Management measured eye opening and face     Manual Lymphatic Drainage (MLD) right neck and shoulder circles. stationary circles at swollen area under eye and gently around scar as it is now healed. Pt reported that he wasn't feeling well and was dizzy and needed to stop treatment.                      PT Short Term Goals - 03/23/20 2132      PT SHORT TERM GOAL #1   Title --    Baseline --    Time --    Period --     Status --             PT Long Term Goals - 03/23/20 2133      PT LONG TERM GOAL #1   Title Pt will iincrease right eye opening to 1.9 cm    Baseline 1.3 cm on 11/03/2019, 1.3 on 12/25/19, 1.5 on 03/23/2020    Time 6    Period Weeks    Status On-going      PT LONG TERM GOAL #2   Title Pt will be ind in self management of periorbital lymphedema and self manual lymph drainage , compression, and exercise    Baseline 6    Time 4    Period Weeks    Status On-going                  Plan - 03/23/20 2113    Clinical Impression Statement Pt comes back to PT independenltly controlling his mororized scooter wanting to have more treatment for the lymphedema of his right face. Medicaid has denied his Flexitouch and compression garment.  Pt had medical issues today that he attributed to low blood sugar today. He had urinary urgency,  felt very dizzy, had to stop treatment and have his ride called.  He asked to go outside, take his mask off and get some fresh air. O2 sat monitor reading did not register.  He was closely monitored and reported he felt much better and that he returned to normal. He said he wanted to smoke and was asked not to.  He had urinary urgency again and came inside to use his urinal. After that, he then went outside to wait for  his ride and was monitored from a distance. Pt was found to be smoking in the parking lot.  Pt wants to improve his lymphedema and  he has not been able to manage it himself despite several sessions of instruction. He has had improvment but compression but gets frustrated that the results do not last long.  Will try again to contact his daughter and recommend a swell spot that she can get to use for compresison to his face under his eye so he will still be able to see over it. Pt states that he will bring in the eye patch he uses next session to see if it can be modified. Pt visits may be limited by his medical conditions, family support or follow through  at ALF/SNF and compliance to previous suggestions,    Comorbidities melanoma recurrance, right AKA with several revisions, Left great and first toe amputation    Stability/Clinical Decision Making Evolving/Moderate complexity    Clinical Decision Making Moderate    Rehab Potential Fair    PT Frequency 2x / week    PT Duration 6 weeks    PT Treatment/Interventions ADLs/Self Care Home Management;Therapeutic exercise;Patient/family education;Orthotic Fit/Training;Manual lymph drainage;Taping;Compression bandaging    PT Next Visit Plan see if pt brought his eye patch in . Can it be used to cover left eye and the strap used to hold a swell spot over swelling of right cheek.  Is there someone at ALF/SNF that can put use of compression in his car plan there?  Contact daughter for family support.    Consulted and Agree with Plan of Care Patient           Patient will benefit from skilled therapeutic intervention in order to improve the following deficits and impairments:  Increased edema, Decreased knowledge of use of DME, Decreased knowledge of precautions, Postural dysfunction  Visit Diagnosis: Lymphedema, not elsewhere classified - Plan: PT plan of care cert/re-cert  Aftercare following surgery for neoplasm - Plan: PT plan of care cert/re-cert     Problem List Patient Active Problem List   Diagnosis Date Noted   Melanoma of cheek ([place]) 10/15/2019   Acute respiratory failure with hypoxia ([place]) 07/11/2019   Acute respiratory disease due to COVID-19 virus 07/11/2019   Wound infection 06/22/2019   Abscess 06/22/2019   CKD (chronic kidney disease), stage III ([place]) 05/23/2019   Tobacco use 05/23/2019   Sepsis ([place]) 05/23/2019   S/P AKA (above knee amputation) unilateral, right ([place]) 12/05/2018   Amputation stump infection ([place])    Lymphedema of left leg    Diabetic polyneuropathy associated with type 2 diabetes mellitus ([place])    Above knee amputation of right lower extremity  ([place]) 10/22/2018   Dehiscence of amputation stump ([place])    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[place]) 08/12/2018   Candidiasis 08/12/2018   Necrosis of toe ([place]) 07/08/2018   Amputated toe of left foot ([place]) 06/18/2018   Streptococcal infection    Pressure injury of skin 06/06/2018   Cutaneous abscess of left foot    Moderate protein-calorie malnutrition ([place])    Cellulitis of left leg    Septic arthritis ([place]) 06/03/2018   Idiopathic chronic venous hypertension of left lower extremity with ulcer and inflammation ([place]) 11/14/2017   Great toe amputation status, left 11/14/2017   Osteomyelitis ([place]) 03/25/2017   Skin cancer 03/25/2017   Depression, recurrent ([place]) 11/05/2016   Diabetic ulcer of toe of left foot ([place]) 10/02/2016   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   Chronic  pain 09/27/2016   Dyslipidemia associated with type 2 diabetes mellitus ([place]) 06/06/2016   GERD without esophagitis 05/11/2016   Hypertensive heart disease with congestive heart failure ([place]) 12/15/2015   Spinal stenosis in cervical region 12/15/2015   Type II diabetes mellitus with neurological manifestations ([place]) 12/15/2015   Chronic diastolic CHF (congestive heart failure) ([place])    Pulmonary hypertension ([place])    Urinary retention    Diskitis    Foot drop, bilateral    Sepsis with acute renal failure without septic shock ([place])    MRSA bacteremia 10/03/2015   COPD (chronic obstructive pulmonary disease) ([place]) 10/03/2015   Acute osteomyelitis of left foot ([place]) 10/03/2015   Donato Heinz. Owens Shark PT  Norwood Levo 03/23/2020, 9:38 PM  [place], [place], [address] Phone: [phone]   Fax:  [phone]  Name: KIMSEY DEMAREE MRN: [id number] Date of Birth: 1960/11/12

## 2020-03-23 NOTE — Addendum Note (Signed)
Addended by: Landis Gandy on: 03/23/2020 11:25 AM   Modules accepted: Orders

## 2020-04-07 ENCOUNTER — Ambulatory Visit: Payer: Medicaid Other

## 2020-04-19 ENCOUNTER — Telehealth: Payer: Self-pay

## 2020-04-19 NOTE — Telephone Encounter (Signed)
Patient called about having PET scan done. Returned patient phone call to make patient aware of scan. No answer left a message. Per Dr Alen Blew scheduled patient for PET scan on 05/03/20. Patient is a resident at [facility] phone number [phone]. Spoke with [place] about patient's appointment time and NPO directions. Requested appointment time be faxed over. Faxed over appointment calender and NPO directions to [phone].

## 2020-04-19 NOTE — Telephone Encounter (Signed)
-----   Message from Wyatt Portela, MD sent at 04/19/2020 10:33 AM EDT ----- It was ordered and was not done. Please contact radiology scheduling. Thanks ----- Message ----- From: Kennedy Bucker, LPN Sent: 16/01/3728  10:29 AM EDT To: Wyatt Portela, MD  Patient called questing scheduling PET scan. Patient wanted to know when should it be scheduled. Please advise.   Kim LPN

## 2020-05-03 ENCOUNTER — Encounter (HOSPITAL_COMMUNITY): Payer: Self-pay

## 2020-05-03 ENCOUNTER — Ambulatory Visit (HOSPITAL_COMMUNITY)
Admission: RE | Admit: 2020-05-03 | Discharge: 2020-05-03 | Disposition: A | Discharge status: Home / Self Care | Payer: Medicaid Other | Source: Ambulatory Visit | Attending: Oncology | Admitting: Oncology

## 2020-05-03 ENCOUNTER — Other Ambulatory Visit: Payer: Self-pay

## 2020-05-03 DIAGNOSIS — C4339 Malignant melanoma of other parts of face: Secondary | ICD-10-CM | POA: Insufficient documentation

## 2020-05-05 ENCOUNTER — Telehealth: Payer: Self-pay

## 2020-05-05 NOTE — Telephone Encounter (Signed)
Received voice message from patient regarding MD ordered PET/CT. Telephone call to patient, unable to reach LVM that order for PET scan is correct and he can go ahead to get it done. Left call back number for any questions or concerns.

## 2020-05-24 ENCOUNTER — Ambulatory Visit (INDEPENDENT_AMBULATORY_CARE_PROVIDER_SITE_OTHER): Payer: Medicaid Other | Admitting: Sports Medicine

## 2020-05-24 ENCOUNTER — Other Ambulatory Visit: Payer: Self-pay

## 2020-05-24 VITALS — BP 137/70 | Ht 72.0 in | Wt 240.0 lb

## 2020-05-24 DIAGNOSIS — M25551 Pain in right hip: Secondary | ICD-10-CM | POA: Diagnosis not present

## 2020-05-24 MED ORDER — METHYLPREDNISOLONE ACETATE 40 MG/ML IJ SUSP
40.0000 mg | Freq: Once | INTRAMUSCULAR | Status: AC
  Administered 2020-05-24: 40 mg via INTRA_ARTICULAR

## 2020-05-24 NOTE — Patient Instructions (Signed)
It was nice meeting you today Mr. Artman. Today, you received a cortisone injection into the lower lateral thigh muscle on the right. Since you are requesting a prescription for prednisone, I would like for you to discuss this with your primary care doctor first.  If they are okay with me ordering it, have them contact my office and I will call it in for you.

## 2020-05-25 ENCOUNTER — Encounter: Payer: Self-pay | Admitting: Sports Medicine

## 2020-05-25 NOTE — Progress Notes (Signed)
   Subjective:    Patient ID: Gregory [name], male    DOB: 07-15-1960, 59 y.o.   MRN: [id number]  HPI chief complaint: Right leg pain  Patient is a 59 year old gentleman that is status post right AKA and revision who comes in today complaining of pain around the right stump.  He is requesting a cortisone injection in the area and states he has had excellent results with cortisone injections and other parts of his body in the past.  He currently resides at an assisted living facility.  He receives wound care therefore decubitus ulcers.  He also has a history of lymphedema in the left lower leg with associated wounds.  Denies fevers or chills.  Past medical history reviewed Medications reviewed Allergies reviewed    Review of Systems    Above Objective:   Physical Exam  Patient sits comfortably in his wheelchair.  No acute distress  Right AKA.  Well-healed surgical incision.  No drainage.  No erythema.  No signs of infection.      Assessment & Plan:   Status post right AKA  I explained to the patient that a cortisone injection may not help but I do not think that it will be harmful to him.  I performed a simple IM injection to the right distal lateral quad.  He tolerates this without difficulty.  He is also requesting a prescription for prednisone but given his uncontrolled diabetes, I will defer this to his PCP.  Follow-up with me as needed.  Consent obtained and verified. Time-out conducted. Noted no overlying erythema, induration, or other signs of local infection. Skin prepped in a sterile fashion. Topical analgesic spray: Ethyl chloride. Joint: Right distal lateral thigh/ quad (IM injection) Needle: 25g 1.5 inch Completed without difficulty. Meds: 3cc 1% xylocaine, 1cc (40mg ) depomedrol  Advised to call if fevers/chills, erythema, induration, drainage, or persistent bleeding.

## 2020-06-20 ENCOUNTER — Telehealth: Payer: Self-pay | Admitting: *Deleted

## 2020-06-20 NOTE — Telephone Encounter (Signed)
Patient called. PET scan currently ordered by Dr. Clelia Croft.  He does not want to have a PET scan. He does not think it is needed. He wants to have a CT scan and wants Dr. Clelia Croft to order that.  Message routed to Dr. Clelia Croft.

## 2020-07-06 ENCOUNTER — Inpatient Hospital Stay: Payer: Medicaid Other | Attending: Oncology | Admitting: Oncology

## 2020-07-06 ENCOUNTER — Other Ambulatory Visit: Payer: Self-pay

## 2020-07-06 VITALS — BP 148/77 | HR 86 | Temp 97.8°F | Resp 18 | Ht 72.0 in

## 2020-07-06 DIAGNOSIS — C4339 Malignant melanoma of other parts of face: Secondary | ICD-10-CM | POA: Insufficient documentation

## 2020-07-06 DIAGNOSIS — Z79899 Other long term (current) drug therapy: Secondary | ICD-10-CM | POA: Diagnosis not present

## 2020-07-06 NOTE — Progress Notes (Signed)
Hematology and Oncology Follow Up Visit  Gregory [name] [id number] 04-26-1961 60 y.o. 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  He presented with stage IIb the right face diagnosed in 2018.    Prior Therapy:  Is status post surgical resection Gregory [name] at [facility] in 2018.    He was noted to have a lesion on his right cheek in December 2020.  Biopsy obtained at that time which showed melanoma consistent with likely in-transit metastasis.  He underwent repeat surgical excision on August 24, 2019.  The final pathology showed a 2.8 cm neoplasm with positive margins.  The tumor showed spindled to epithelioid melanocyte with areas of necrosis and nuclear atypia and frequent mitotic figures.    He considered radiation therapy for a total of 48 Gray in 20 fractions to the right cheek but opted against it.   Current therapy: Active surveillance.  Interim History: Gregory [name] is here for a follow-up evaluation.  Since the last visit, he reports no major changes in his health.  He denies any facial swelling or pain.  He does have periorbital edema that continues to improve slowly.  He denies any nausea, vomiting or abdominal pain.  He denies any recent hospitalization or illnesses.     Medications: Updated on review. Current Outpatient Medications  Medication Sig Dispense Refill  . acetaminophen (TYLENOL) 325 MG tablet Take 650 mg by mouth every 4 (four) hours as needed (general discomfort).     Marland Kitchen albuterol (PROVENTIL HFA;VENTOLIN HFA) 108 (90 Base) MCG/ACT inhaler Inhale 2 puffs into the lungs every 6 (six) hours as needed for wheezing or shortness of breath.    Marland Kitchen alum & mag hydroxide-simeth (MAALOX PLUS) 400-400-40 MG/5ML suspension Take by mouth.    Marland Kitchen amLODipine (NORVASC) 10 MG tablet Take 10 mg by mouth daily. for HTN    .  Baclofen 5 MG TABS Take 5 mg by mouth 3 (three) times daily.     Marland Kitchen buPROPion (WELLBUTRIN XL) 300 MG 24 hr tablet Take 300 mg by mouth at bedtime.    . cetirizine (ZYRTEC) 10 MG tablet Take 10 mg by mouth daily.    . Cholecalciferol (VITAMIN D) 50 MCG (2000 UT) tablet Take 2,000 Units by mouth daily.    . Cholecalciferol (VITAMIN D-3) 125 MCG (5000 UT) TABS Take 1 tablet by mouth daily.    . Cholecalciferol (VITAMIN D-3) 5000 units TABS Take 5,000 Units by mouth daily.    . diphenhydrAMINE (SOMINEX) 25 MG tablet Take by mouth.    . docusate sodium (COLACE) 100 MG capsule Take 1 capsule (100 mg total) by mouth 2 (two) times daily. 10 capsule 0  . eszopiclone (LUNESTA) 1 MG TABS tablet Take 1 mg by mouth at bedtime. Take immediately before bedtime    . ferrous sulfate 325 (65 FE) MG tablet Take 1 tablet (325 mg total) by mouth daily with breakfast. 30 tablet 0  . finasteride (PROSCAR) 5 MG tablet Take 5 mg by mouth daily.    . furosemide (LASIX) 40 MG tablet Take by mouth.    . gabapentin (NEURONTIN) 100 MG capsule Take by mouth.    Marland Kitchen glipiZIDE (GLUCOTROL) 10 MG tablet Take 10 mg by mouth daily before breakfast.    . ibuprofen (ADVIL) 800 MG tablet Take 800 mg by mouth 3 (three) times daily.    Marland Kitchen  insulin aspart (NOVOLOG) 100 UNIT/ML injection Inject 0-9 Units into the skin 3 (three) times daily with meals. 8.1 mL 0  . insulin glargine (LANTUS) 100 UNIT/ML injection Inject into the skin.    Marland Kitchen insulin lispro (HUMALOG KWIKPEN) 100 UNIT/ML KwikPen Inject 16 Units into the skin 3 (three) times daily before meals.     . lidocaine (LIDODERM) 5 % Place 1 patch onto the skin daily. Apply to Right stump. Remove & Discard patch within 12 hours or as directed by MD    . LYRICA 300 MG capsule Take 300 mg by mouth 2 (two) times daily.    . magnesium oxide (MAG-OX) 400 MG tablet Take 400 mg by mouth 2 (two) times daily.    . metoprolol tartrate (LOPRESSOR) 25 MG tablet Take 0.5 tablets (12.5 mg total) by mouth 2  (two) times daily. 30 tablet 0  . mirabegron ER (MYRBETRIQ) 50 MG TB24 tablet Take 50 mg by mouth daily.    . mometasone (NASONEX) 50 MCG/ACT nasal spray Place 2 sprays into the nose at bedtime.     . naloxone (NARCAN) 4 MG/0.1ML LIQD nasal spray kit Place into the nose.    . nicotine (NICODERM CQ - DOSED IN MG/24 HOURS) 14 mg/24hr patch Place 1 patch (14 mg total) onto the skin at bedtime. 28 patch 0  . omeprazole (PRILOSEC) 20 MG capsule Take 40 mg by mouth daily before breakfast.     . ondansetron (ZOFRAN) 4 MG tablet Take by mouth.    . oxybutynin (DITROPAN-XL) 10 MG 24 hr tablet Take 10 mg by mouth at bedtime.    Marland Kitchen oxycodone (ROXICODONE) 30 MG immediate release tablet Take by mouth.    Marland Kitchen PARoxetine (PAXIL) 10 MG tablet Take 10 mg by mouth at bedtime.     . promethazine (PHENERGAN) 25 MG tablet Take by mouth.    . rosuvastatin (CRESTOR) 10 MG tablet Take 10 mg by mouth at bedtime.    . senna-docusate (SENOKOT-S) 8.6-50 MG tablet Take 2 tablets by mouth at bedtime.    . tamsulosin (FLOMAX) 0.4 MG CAPS capsule Take 0.4 mg by mouth every evening.     . terbinafine (LAMISIL) 250 MG tablet Take 250 mg by mouth daily.    . Tiotropium Bromide Monohydrate (SPIRIVA RESPIMAT) 1.25 MCG/ACT AERS Inhale 2 puffs into the lungs daily.    Marland Kitchen triamcinolone cream (KENALOG) 0.1 % Apply 1 application topically 2 (two) times daily. 453.6 g 2  . vitamin B-12 (CYANOCOBALAMIN) 500 MCG tablet Take 1,000 mcg by mouth daily.     . vitamin C (ASCORBIC ACID) 250 MG tablet Take 500 mg by mouth 2 (two) times daily.    Marland Kitchen zinc sulfate (ZINC-220) 220 (50 Zn) MG capsule Take 220 mg by mouth daily.     No current facility-administered medications for this visit.     Allergies: No Known Allergies      Physical Exam:  Blood pressure (!) 148/77, pulse 86, temperature 97.8 F (36.6 C), temperature source Tympanic, resp. rate 18, height 6' (1.829 m), SpO2 96 %.   ECOG: 1   General appearance: Alert, awake without  any distress. Head: Atraumatic without abnormalities Oropharynx: Without any thrush or ulcers. Eyes: No scleral icterus. Lymph nodes: No lymphadenopathy noted in the cervical, supraclavicular, or axillary nodes Heart:regular rate and rhythm, without any murmurs or gallops.   Lung: Clear to auscultation without any rhonchi, wheezes or dullness to percussion. Abdomin: Soft, nontender without any shifting dullness or ascites. Musculoskeletal: No  clubbing or cyanosis. Neurological: No motor or sensory deficits. Skin: No rashes or lesions.    Lab Results: Lab Results  Component Value Date   WBC 7.7 02/24/2020   HGB 10.7 (L) 02/24/2020   HCT 33.8 (L) 02/24/2020   MCV 89.9 02/24/2020   PLT 221 02/24/2020     Chemistry      Component Value Date/Time   NA 137 02/24/2020 0938   NA 138 10/22/2016 0000   K 4.5 02/24/2020 0938   CL 101 02/24/2020 0938   CO2 27 02/24/2020 0938   BUN 26 (H) 02/24/2020 0938   BUN 26 (A) 10/22/2016 0000   CREATININE 1.95 (H) 02/24/2020 0938   CREATININE 2.67 (H) 10/02/2016 1651   GLU 154 10/22/2016 0000      Component Value Date/Time   CALCIUM 9.0 02/24/2020 0938   ALKPHOS 94 02/24/2020 0938   AST 8 (L) 02/24/2020 0938   ALT 8 02/24/2020 0938   BILITOT 0.3 02/24/2020 0938         Impression and Plan:  60 year old man with:  1.   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  Initially presented with stage II in 2018.     He has deferred the option of repeat surgical resection or radiation and overall I will opted with active surveillance.  I have arranged for a PET scan which he has also declined at this time.  Treatment options moving forward were discussed including active surveillance and utilizing immunotherapy if he develops metastatic disease.  He has no clinical signs or symptoms of disease progression at this time.  After discussion, he is agreeable to have a CT scan done which we will  arrange in the near future.     2.  Follow-up: Will be in 6 months for repeat follow-up.   30  minutes were spent on this visit.  The time was dedicated to reviewing disease status, discussing treatment options and future plan of care review.     Zola Button, MD 1/19/20229:33 AM

## 2020-07-13 ENCOUNTER — Encounter (HOSPITAL_BASED_OUTPATIENT_CLINIC_OR_DEPARTMENT_OTHER): Payer: Medicaid Other | Attending: Physician Assistant | Admitting: Physician Assistant

## 2020-07-20 ENCOUNTER — Ambulatory Visit (HOSPITAL_COMMUNITY): Payer: Medicaid Other

## 2020-07-20 ENCOUNTER — Telehealth: Payer: Self-pay

## 2020-07-20 NOTE — Telephone Encounter (Signed)
Received a call from [facility] in Radiology stating patient was a no show for today's scans. Attempted to call patient. No answer. Dr. Alen Blew made aware that patient was a no show for today's scheduled scans.

## 2020-08-10 ENCOUNTER — Ambulatory Visit: Payer: Medicaid Other | Admitting: Internal Medicine

## 2020-08-16 ENCOUNTER — Encounter (HOSPITAL_BASED_OUTPATIENT_CLINIC_OR_DEPARTMENT_OTHER): Payer: Medicaid Other | Attending: Internal Medicine | Admitting: Internal Medicine

## 2020-08-26 ENCOUNTER — Ambulatory Visit (HOSPITAL_COMMUNITY): Payer: Medicaid Other

## 2020-09-08 ENCOUNTER — Telehealth: Payer: Self-pay | Admitting: *Deleted

## 2020-09-08 ENCOUNTER — Other Ambulatory Visit: Payer: Self-pay | Admitting: Internal Medicine

## 2020-09-08 DIAGNOSIS — I872 Venous insufficiency (chronic) (peripheral): Secondary | ICD-10-CM

## 2020-09-08 MED ORDER — TRIAMCINOLONE ACETONIDE 0.025 % EX OINT: 1.0000 "application " | TOPICAL_OINTMENT | Freq: Two times a day (BID) | CUTANEOUS | 5 refills | Status: DC

## 2020-09-08 NOTE — Telephone Encounter (Signed)
Patient called for refill of triamcinolone cream to be sent to [facility] in [place].  He was using daily this for red bumps on his left leg. Patient lives at [facility] skilled facility [phone]). He will need to reschedule his missed appointment from February, as he was sick and could not come.  Please advise on refills. He is able to come back for follow up, just needs 2-3 weeks notice for transportation. Landis Gandy, RN

## 2020-09-08 NOTE — Telephone Encounter (Signed)
I left him a message letting him know the refill was sent to the pharmacy. Does he need to see you? He missed a follow up appointment in February. Landis Gandy, RN

## 2020-09-08 NOTE — Progress Notes (Signed)
rx for triamcinolone cream for venous stasis dermatitis

## 2020-09-09 NOTE — Telephone Encounter (Signed)
He doesnt need to see me. He can follow up with pcp for management of venous stasis  If he have cellulitis and wants to see [place], then yes  Thanks Sharyn Lull

## 2020-10-19 ENCOUNTER — Ambulatory Visit (HOSPITAL_COMMUNITY)
Admission: RE | Admit: 2020-10-19 | Discharge: 2020-10-19 | Disposition: A | Discharge status: Home / Self Care | Payer: Medicaid Other | Source: Ambulatory Visit | Attending: Oncology | Admitting: Oncology

## 2020-10-19 ENCOUNTER — Other Ambulatory Visit: Payer: Self-pay

## 2020-10-19 DIAGNOSIS — C4339 Malignant melanoma of other parts of face: Secondary | ICD-10-CM | POA: Diagnosis not present

## 2020-10-21 ENCOUNTER — Telehealth: Payer: Self-pay | Admitting: *Deleted

## 2020-10-21 ENCOUNTER — Encounter: Payer: Self-pay | Admitting: Oncology

## 2020-10-21 ENCOUNTER — Telehealth: Payer: Self-pay | Admitting: Oncology

## 2020-10-21 NOTE — Telephone Encounter (Signed)
PC to patient informing him Dr. Alen Blew has reviewed his CT results & is setting up a phone appointment with him.  Patient verbalizes understanding.

## 2020-10-21 NOTE — Telephone Encounter (Signed)
Chart opened in error

## 2020-10-21 NOTE — Telephone Encounter (Signed)
Scheduled appt per 5/6 sch msg. Pt aware.  

## 2020-10-21 NOTE — Telephone Encounter (Signed)
-----   Message from Wyatt Portela, MD sent at 10/21/2020 10:24 AM EDT ----- Regarding: RE: CT from 10/19/20 I will schedule a Phone visit soon. Thanks ----- Message ----- From: Rolene Course, RN Sent: 10/21/2020  10:23 AM EDT To: Wyatt Portela, MD Subject: CT from 10/19/20                                 This patient called this morning, he saw his CT results from 10/19/20 on MyChart & thinks there are some changes.  He is asking if he needs to be seen sooner than his scheduled appointment in July.  Please advise, thanks.

## 2020-10-27 ENCOUNTER — Inpatient Hospital Stay: Payer: Medicaid Other | Attending: Oncology | Admitting: Oncology

## 2020-10-27 DIAGNOSIS — C4339 Malignant melanoma of other parts of face: Secondary | ICD-10-CM | POA: Diagnosis not present

## 2020-10-27 NOTE — Progress Notes (Signed)
Hematology and Oncology Follow Up for Telemedicine Visits  Gregory [name] [id number] 03-22-1961 60 y.o. 10/27/2020 12:22 PM [name], MDTejan-Sie, Brandt Loosen, MD   I connected with Mr. Orosz on 10/27/20 at  1:00 PM EDT by telephone visit and verified that I am speaking with the correct person using two identifiers.   I discussed the limitations, risks, security and privacy concerns of performing an evaluation and management service by telemedicine and the availability of in-person appointments. I also discussed with the patient that there may be a patient responsible charge related to this service. The patient expressed understanding and agreed to proceed.  Other persons participating in the visit and their role in the encounter: Daughter Joellen Jersey  Patient's location: Home Provider's location: Office    Principle Diagnosis: 60 year old man with melanoma of the right face diagnosed in 2018.  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    Prior Therapy:  He is status post surgical resection Dr. Crisoforo Oxford at [facility] in 2018.   He was noted to have a lesion on his right cheek in December 2020. Biopsy obtained at that time which showed melanoma consistent with likely in-transit metastasis. He underwent repeat surgical excision on August 24, 2019. The final pathology showed a 2.8 cm neoplasm with positive margins. The tumor showed spindled to epithelioid melanocyte with areas of necrosis and nuclear atypia and frequent mitotic figures.    He considered radiation therapy for a total of 48 Gray in 20 fractions to the right cheek but opted against it.   Current therapy:  Under consideration for additional therapy.     Interim History: Mr. Osuna reports reports no major changes in his health.  He does report some occasional swelling and nodularity along the incision on the right face but no  lymphadenopathy or any constitutional symptoms.  He denies any worsening pain or changes in his mental status.     Medications: I have reviewed the patient's current medications.  Current Outpatient Medications  Medication Sig Dispense Refill  . acetaminophen (TYLENOL) 325 MG tablet Take 650 mg by mouth every 4 (four) hours as needed (general discomfort).     Marland Kitchen albuterol (PROVENTIL HFA;VENTOLIN HFA) 108 (90 Base) MCG/ACT inhaler Inhale 2 puffs into the lungs every 6 (six) hours as needed for wheezing or shortness of breath.    Marland Kitchen alum & mag hydroxide-simeth (MAALOX PLUS) 400-400-40 MG/5ML suspension Take by mouth.    Marland Kitchen amLODipine (NORVASC) 10 MG tablet Take 10 mg by mouth daily. for HTN    . Baclofen 5 MG TABS Take 5 mg by mouth 3 (three) times daily.     Marland Kitchen buPROPion (WELLBUTRIN XL) 300 MG 24 hr tablet Take 300 mg by mouth at bedtime.    . cetirizine (ZYRTEC) 10 MG tablet Take 10 mg by mouth daily.    . Cholecalciferol (VITAMIN D) 50 MCG (2000 UT) tablet Take 2,000 Units by mouth daily.    . Cholecalciferol (VITAMIN D-3) 125 MCG (5000 UT) TABS Take 1 tablet by mouth daily.    . Cholecalciferol (VITAMIN D-3) 5000 units TABS Take 5,000 Units by mouth daily.    . diphenhydrAMINE (SOMINEX) 25 MG tablet Take by mouth.    . docusate sodium (COLACE) 100 MG capsule Take 1 capsule (100 mg total) by mouth 2 (two) times daily. 10 capsule 0  . eszopiclone (LUNESTA) 1 MG TABS tablet Take 1 mg by mouth at  bedtime. Take immediately before bedtime    . ferrous sulfate 325 (65 FE) MG tablet Take 1 tablet (325 mg total) by mouth daily with breakfast. 30 tablet 0  . finasteride (PROSCAR) 5 MG tablet Take 5 mg by mouth daily.    . furosemide (LASIX) 40 MG tablet Take by mouth.    . gabapentin (NEURONTIN) 100 MG capsule Take by mouth.    Marland Kitchen glipiZIDE (GLUCOTROL) 10 MG tablet Take 10 mg by mouth daily before breakfast.    . ibuprofen (ADVIL) 800 MG tablet Take 800 mg by mouth 3 (three) times daily.    . insulin  aspart (NOVOLOG) 100 UNIT/ML injection Inject 0-9 Units into the skin 3 (three) times daily with meals. 8.1 mL 0  . insulin glargine (LANTUS) 100 UNIT/ML injection Inject into the skin.    Marland Kitchen insulin lispro (HUMALOG KWIKPEN) 100 UNIT/ML KwikPen Inject 16 Units into the skin 3 (three) times daily before meals.     . lidocaine (LIDODERM) 5 % Place 1 patch onto the skin daily. Apply to Right stump. Remove & Discard patch within 12 hours or as directed by MD    . LYRICA 300 MG capsule Take 300 mg by mouth 2 (two) times daily.    . magnesium oxide (MAG-OX) 400 MG tablet Take 400 mg by mouth 2 (two) times daily.    . metoprolol tartrate (LOPRESSOR) 25 MG tablet Take 0.5 tablets (12.5 mg total) by mouth 2 (two) times daily. 30 tablet 0  . mirabegron ER (MYRBETRIQ) 50 MG TB24 tablet Take 50 mg by mouth daily.    . mometasone (NASONEX) 50 MCG/ACT nasal spray Place 2 sprays into the nose at bedtime.     . naloxone (NARCAN) 4 MG/0.1ML LIQD nasal spray kit Place into the nose.    . nicotine (NICODERM CQ - DOSED IN MG/24 HOURS) 14 mg/24hr patch Place 1 patch (14 mg total) onto the skin at bedtime. 28 patch 0  . omeprazole (PRILOSEC) 20 MG capsule Take 40 mg by mouth daily before breakfast.     . ondansetron (ZOFRAN) 4 MG tablet Take by mouth.    . oxybutynin (DITROPAN-XL) 10 MG 24 hr tablet Take 10 mg by mouth at bedtime.    Marland Kitchen oxycodone (ROXICODONE) 30 MG immediate release tablet Take by mouth.    Marland Kitchen PARoxetine (PAXIL) 10 MG tablet Take 10 mg by mouth at bedtime.     . promethazine (PHENERGAN) 25 MG tablet Take by mouth.    . rosuvastatin (CRESTOR) 10 MG tablet Take 10 mg by mouth at bedtime.    . senna-docusate (SENOKOT-S) 8.6-50 MG tablet Take 2 tablets by mouth at bedtime.    . tamsulosin (FLOMAX) 0.4 MG CAPS capsule Take 0.4 mg by mouth every evening.     . terbinafine (LAMISIL) 250 MG tablet Take 250 mg by mouth daily.    . Tiotropium Bromide Monohydrate (SPIRIVA RESPIMAT) 1.25 MCG/ACT AERS Inhale 2 puffs  into the lungs daily.    Marland Kitchen triamcinolone (KENALOG) 0.025 % ointment Apply 1 application topically 2 (two) times daily. 30 g 5  . vitamin B-12 (CYANOCOBALAMIN) 500 MCG tablet Take 1,000 mcg by mouth daily.     . vitamin C (ASCORBIC ACID) 250 MG tablet Take 500 mg by mouth 2 (two) times daily.    Marland Kitchen zinc sulfate (ZINC-220) 220 (50 Zn) MG capsule Take 220 mg by mouth daily.     No current facility-administered medications for this visit.  Lab Results: Lab Results  Component Value Date   WBC 7.7 02/24/2020   HGB 10.7 (L) 02/24/2020   HCT 33.8 (L) 02/24/2020   MCV 89.9 02/24/2020   PLT 221 02/24/2020     Chemistry      Component Value Date/Time   NA 137 02/24/2020 0938   NA 138 10/22/2016 0000   K 4.5 02/24/2020 0938   CL 101 02/24/2020 0938   CO2 27 02/24/2020 0938   BUN 26 (H) 02/24/2020 0938   BUN 26 (A) 10/22/2016 0000   CREATININE 1.95 (H) 02/24/2020 0938   CREATININE 2.67 (H) 10/02/2016 1651   GLU 154 10/22/2016 0000      Component Value Date/Time   CALCIUM 9.0 02/24/2020 0938   ALKPHOS 94 02/24/2020 0938   AST 8 (L) 02/24/2020 0938   ALT 8 02/24/2020 0938   BILITOT 0.3 02/24/2020 0938      IMPRESSION: 1. No evidence of melanoma metastasis in the thorax. 2. Stable small benign appearing mediastinal lymph nodes. 3. Resolution of mild ground-glass nodularity seen on comparison CT scan from January 2021. 4. Coronary artery calcification and Aortic Atherosclerosis  CLINICAL DATA:  Melanoma of cheek. Skin cancer, staging. Additional history provided by scanning technologist: Melanoma right cheek diagnosed 2018, right cheek resection and lymph node removed from right side of neck.  EXAM: CT NECK WITHOUT CONTRAST  TECHNIQUE: Multidetector CT imaging of the neck was performed following the standard protocol without intravenous contrast.  COMPARISON:  Neck CT 11/12/2019.  Maxillofacial CT 11/12/2019.  FINDINGS: Pharynx and larynx: Poor dentition.  No appreciable swelling or discrete mass within the oral cavity, pharynx or larynx.  Salivary glands: Redemonstrated excision tract overlying the right parotid gland. The parotid and submandibular glands are otherwise unremarkable.  Thyroid: Unremarkable.  Lymph nodes: New from the prior neck CT of 11/12/2019, there are left level 2/3 lymph nodes which are enlarged by short axis criteria (measuring up to 1.7 cm in short axis (for instance as seen on series 8, images 80 and 77). A right level 2 lymph node has also slightly increased in size, but measures subcentimeter in short axis (9 mm) (series 8, image 28).  Vascular: There is limited assessment of the major vascular structures of the neck in the absence of intravenous contrast. Mild calcified plaque within the visualized aortic arch and proximal major branch vessels of the neck. Calcified plaque is also present within the carotid arteries bilaterally.  Limited intracranial: No evidence of acute intracranial abnormality within the field of view.  Visualized orbits: The orbits are incompletely imaged. No acute or significant finding within the visible orbits.  Mastoids and visualized paranasal sinuses: Small left maxillary sinus mucous retention cyst. Trace bilateral ethmoid sinus mucosal thickening at the imaged levels.  Skeleton: Redemonstrated postoperative changes to the cervical spine with sequela of prior anterior and posterior fusion and previous C5 corpectomy. Composed cervical spondylosis. No acute bony abnormality or aggressive osseous lesion.  Upper chest: Please see concurrently performed chest CT for description of intrathoracic findings.  Other: Not significantly changed from the prior maxillofacial CT of 11/12/2019, there is masslike/nodular cutaneous/subcutaneous thickening in the right lower eyelid and right cheek soft tissues, in total encompassing 4 cm.  New from the prior exam, there is a  region skin thickening and subtle subcutaneous induration within the left perimandibular region, measuring 1.9 cm (for instance as seen on series 3, image 59).  Unchanged nonspecific 11 mm cystic appearing cutaneous/subcutaneous lesion within the posterior right lower neck, unchanged  from the prior neck CT (series 3, image 47). Unchanged nonspecific 7 mm cystic appearing cutaneous/subcutaneous lesion along the right external ear (series 3, image 21).  Impressions 1,2,3 will be called to the ordering clinician or representative by the Radiologist Assistant, and communication documented in the PACS or [employer].  IMPRESSION: Area of masslike/nodular cutaneous/subcutaneous thickening in the right lower eyelid and right cheek soft tissues, in total encompassing 4 cm, not significantly changed from the prior maxillofacial CT of 11/12/2019.  New from the prior exam, there is a region of skin thickening and subtle subcutaneous induration within the left perimandibular region, measuring 1.9 cm. Direct visualization is recommended.  Enlarged left level 2/3 lymph nodes, new from the prior neck CT of 11/12/2019. These lymph nodes measure up to 1.7 cm in short axis. Additionally, a right level 2 lymph node has slightly increased in size, but remains subcentimeter in short axis (9 mm). Nodal metastatic disease cannot be excluded. Consider PET-CT for further evaluation.  Unchanged nonspecific 11 mm cystic appearing cutaneous/subcutaneous lesion within the posterior right lower neck.  Unchanged nonspecific 7 mm cystic appearing cutaneous/subcutaneous lesion along the right external ear.  Paranasal sinus disease as described.  Redemonstrated extensive postoperative changes to the cervical spine with superimposed spondylosis.    Impression and Plan:  60 year old man with:  1.   Melanoma of the right face presented with stage II in 2018.  He subsequently developed  in-transit metastasis with surgical resection.   His disease status was updated including imaging studies of the chest and face.  These results were discussed today and showed enlarging left cervical lymph node measuring 1.7 cm slightly increased in size but remains subcentimeter.  There is also a cutaneous and subcutaneous lesion in the posterior right lower eyelid.  These findings are equivocal for melanoma recurrence with significantly suspicious.  Management options at this time were reviewed.  I recommended obtaining a PET scan and potential biopsy of any suspicious lesions.  Systemic therapy including immunotherapy as a single agent or doublet could be considered as well as oral targeted therapy if it is BRAF mutated tumor.   After discussion today, he is agreeable to proceed with a PET scan which will be scheduled in the near future.     2. Follow-up: Will be determined pending results from PET scan.     I discussed the assessment and treatment plan with the patient. The patient was provided an opportunity to ask questions and all were answered. The patient agreed with the plan and demonstrated an understanding of the instructions.   The patient was advised to call back or seek an in-person evaluation if the symptoms worsen or if the condition fails to improve as anticipated.  I provided 20 minutes of non face-to-face telephone visit time during this encounter.  The time was spent on reviewing imaging studies, disease status update and outlining future plan of care.  Zola Button, MD 10/27/2020 12:22 PM

## 2020-11-11 ENCOUNTER — Encounter (HOSPITAL_COMMUNITY): Payer: Medicaid Other

## 2020-11-11 ENCOUNTER — Telehealth: Payer: Self-pay | Admitting: *Deleted

## 2020-11-11 NOTE — Telephone Encounter (Signed)
New order will need to be placed

## 2020-11-11 NOTE — Telephone Encounter (Signed)
Pt left a message stating he cannot come in for the PET scan today. " I am under the weather, feeling achy and stopped up" PET cancelled. Will be rescheduled.   LM for patient to call Dr Hazeline Junker nurse

## 2021-01-03 ENCOUNTER — Other Ambulatory Visit: Payer: Self-pay

## 2021-01-03 ENCOUNTER — Inpatient Hospital Stay: Payer: Medicaid Other | Attending: Oncology | Admitting: Oncology

## 2021-01-03 VITALS — BP 126/67 | HR 77 | Temp 98.2°F | Resp 18 | Ht 72.0 in | Wt 229.0 lb

## 2021-01-03 DIAGNOSIS — C760 Malignant neoplasm of head, face and neck: Secondary | ICD-10-CM | POA: Diagnosis not present

## 2021-01-03 DIAGNOSIS — C4339 Malignant melanoma of other parts of face: Secondary | ICD-10-CM

## 2021-01-03 DIAGNOSIS — Z79899 Other long term (current) drug therapy: Secondary | ICD-10-CM | POA: Insufficient documentation

## 2021-01-03 NOTE — Progress Notes (Signed)
Hematology and Oncology Follow Up  Gregory [name] [id number] [birth year] 60 y.o. 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  He initially presented with localized disease in 2018 on the right cheek.     Prior Therapy:   He is status post surgical resection Dr. Crisoforo Oxford at [facility] in 2018.     He was noted to have a lesion on his right cheek in December 2020.  Biopsy obtained at that time which showed melanoma consistent with likely in-transit metastasis.  He underwent repeat surgical excision on August 24, 2019.  The final pathology showed a 2.8 cm neoplasm with positive margins.  The tumor showed spindled to epithelioid melanocyte with areas of necrosis and nuclear atypia and frequent mitotic figures.      He considered radiation therapy for a total of 48 Gray in 20 fractions to the right cheek but opted against it.     Current therapy:  Under consideration for systemic therapy.     Interim History: Gregory [name] reports returns today for a follow-up visit.  Since the last visit, he reports no major changes in his health.  He has reported no facial swelling or discomfort.  He denies any painful adenopathy.  He denies any hospitalization or illnesses.  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  He denies any fevers or chills or sweats.     Medications: Reviewed without changes. Current Outpatient Medications  Medication Sig Dispense Refill   acetaminophen (TYLENOL) 325 MG tablet Take 650 mg by mouth every 4 (four) hours as needed (general discomfort).      albuterol (PROVENTIL HFA;VENTOLIN HFA) 108 (90 Base) MCG/ACT inhaler Inhale 2 puffs into the lungs every 6 (six) hours as needed for wheezing or shortness of breath.     alum & mag hydroxide-simeth (MAALOX PLUS) 400-400-40 MG/5ML suspension  Take by mouth.     amLODipine (NORVASC) 10 MG tablet Take 10 mg by mouth daily. for HTN     Baclofen 5 MG TABS Take 5 mg by mouth 3 (three) times daily.      buPROPion (WELLBUTRIN XL) 300 MG 24 hr tablet Take 300 mg by mouth at bedtime.     cetirizine (ZYRTEC) 10 MG tablet Take 10 mg by mouth daily.     Cholecalciferol (VITAMIN D) 50 MCG (2000 UT) tablet Take 2,000 Units by mouth daily.     Cholecalciferol (VITAMIN D-3) 125 MCG (5000 UT) TABS Take 1 tablet by mouth daily.     Cholecalciferol (VITAMIN D-3) 5000 units TABS Take 5,000 Units by mouth daily.     diphenhydrAMINE (SOMINEX) 25 MG tablet Take by mouth.     docusate sodium (COLACE) 100 MG capsule Take 1 capsule (100 mg total) by mouth 2 (two) times daily. 10 capsule 0   eszopiclone (LUNESTA) 1 MG TABS tablet Take 1 mg by mouth at bedtime. Take immediately before bedtime     ferrous sulfate 325 (65 FE) MG tablet Take 1 tablet (325 mg total) by mouth daily with breakfast. 30 tablet 0   finasteride (PROSCAR) 5 MG tablet Take 5 mg by mouth daily.     furosemide (LASIX) 40 MG tablet Take by mouth.     gabapentin (NEURONTIN) 100 MG capsule Take by mouth.     glipiZIDE (GLUCOTROL) 10 MG tablet Take 10 mg by  mouth daily before breakfast.     ibuprofen (ADVIL) 800 MG tablet Take 800 mg by mouth 3 (three) times daily.     insulin aspart (NOVOLOG) 100 UNIT/ML injection Inject 0-9 Units into the skin 3 (three) times daily with meals. 8.1 mL 0   insulin glargine (LANTUS) 100 UNIT/ML injection Inject into the skin.     insulin lispro (HUMALOG KWIKPEN) 100 UNIT/ML KwikPen Inject 16 Units into the skin 3 (three) times daily before meals.      lidocaine (LIDODERM) 5 % Place 1 patch onto the skin daily. Apply to Right stump. Remove & Discard patch within 12 hours or as directed by MD     LYRICA 300 MG capsule Take 300 mg by mouth 2 (two) times daily.     magnesium oxide (MAG-OX) 400 MG tablet Take 400 mg by mouth 2 (two) times daily.     metoprolol  tartrate (LOPRESSOR) 25 MG tablet Take 0.5 tablets (12.5 mg total) by mouth 2 (two) times daily. 30 tablet 0   mirabegron ER (MYRBETRIQ) 50 MG TB24 tablet Take 50 mg by mouth daily.     mometasone (NASONEX) 50 MCG/ACT nasal spray Place 2 sprays into the nose at bedtime.      naloxone (NARCAN) 4 MG/0.1ML LIQD nasal spray kit Place into the nose.     nicotine (NICODERM CQ - DOSED IN MG/24 HOURS) 14 mg/24hr patch Place 1 patch (14 mg total) onto the skin at bedtime. 28 patch 0   omeprazole (PRILOSEC) 20 MG capsule Take 40 mg by mouth daily before breakfast.      ondansetron (ZOFRAN) 4 MG tablet Take by mouth.     oxybutynin (DITROPAN-XL) 10 MG 24 hr tablet Take 10 mg by mouth at bedtime.     oxycodone (ROXICODONE) 30 MG immediate release tablet Take by mouth.     PARoxetine (PAXIL) 10 MG tablet Take 10 mg by mouth at bedtime.      promethazine (PHENERGAN) 25 MG tablet Take by mouth.     rosuvastatin (CRESTOR) 10 MG tablet Take 10 mg by mouth at bedtime.     senna-docusate (SENOKOT-S) 8.6-50 MG tablet Take 2 tablets by mouth at bedtime.     tamsulosin (FLOMAX) 0.4 MG CAPS capsule Take 0.4 mg by mouth every evening.      terbinafine (LAMISIL) 250 MG tablet Take 250 mg by mouth daily.     Tiotropium Bromide Monohydrate (SPIRIVA RESPIMAT) 1.25 MCG/ACT AERS Inhale 2 puffs into the lungs daily.     triamcinolone (KENALOG) 0.025 % ointment Apply 1 application topically 2 (two) times daily. 30 g 5   vitamin B-12 (CYANOCOBALAMIN) 500 MCG tablet Take 1,000 mcg by mouth daily.      vitamin C (ASCORBIC ACID) 250 MG tablet Take 500 mg by mouth 2 (two) times daily.     zinc sulfate (ZINC-220) 220 (50 Zn) MG capsule Take 220 mg by mouth daily.     No current facility-administered medications for this visit.    Physical exam Blood pressure 126/67, pulse 77, temperature 98.2 F (36.8 C), temperature source Oral, resp. rate 18, height 6' (1.829 m), weight 229 lb (103.9 kg), SpO2 94 %.  ECOG 1  General  appearance: Comfortable appearing without any discomfort Head: Normocephalic without any trauma Oropharynx: Mucous membranes are moist and pink without any thrush or ulcers. Eyes: Pupils are equal and round reactive to light. Lymph nodes: No cervical, supraclavicular, inguinal or axillary lymphadenopathy.   Heart:regular rate and rhythm.  S1  and S2 without leg edema. Lung: Clear without any rhonchi or wheezes.  No dullness to percussion. Abdomin: Soft, nontender, nondistended with good bowel sounds.  No hepatosplenomegaly. Musculoskeletal: No joint deformity or effusion.  Full range of motion noted. Neurological: No deficits noted on motor, sensory and deep tendon reflex exam. Skin: No erythema or induration on the right side of his face. Psychiatric: Mood and affect appeared appropriate.    Lab Results: Lab Results  Component Value Date   WBC 7.7 02/24/2020   HGB 10.7 (L) 02/24/2020   HCT 33.8 (L) 02/24/2020   MCV 89.9 02/24/2020   PLT 221 02/24/2020     Chemistry      Component Value Date/Time   NA 137 02/24/2020 0938   NA 138 10/22/2016 0000   K 4.5 02/24/2020 0938   CL 101 02/24/2020 0938   CO2 27 02/24/2020 0938   BUN 26 (H) 02/24/2020 0938   BUN 26 (A) 10/22/2016 0000   CREATININE 1.95 (H) 02/24/2020 0938   CREATININE 2.67 (H) 10/02/2016 1651   GLU 154 10/22/2016 0000      Component Value Date/Time   CALCIUM 9.0 02/24/2020 0938   ALKPHOS 94 02/24/2020 0938   AST 8 (L) 02/24/2020 0938   ALT 8 02/24/2020 0938   BILITOT 0.3 02/24/2020 0938      IMPRESSION: 1. No evidence of melanoma metastasis in the thorax. 2. Stable small benign appearing mediastinal lymph nodes. 3. Resolution of mild ground-glass nodularity seen on comparison CT scan from January 2021. 4. Coronary artery calcification and Aortic Atherosclerosis     IMPRESSION: Area of masslike/nodular cutaneous/subcutaneous thickening in the right lower eyelid and right cheek soft tissues, in  total encompassing 4 cm, not significantly changed from the prior maxillofacial CT of 11/12/2019.   New from the prior exam, there is a region of skin thickening and subtle subcutaneous induration within the left perimandibular region, measuring 1.9 cm. Direct visualization is recommended.   Enlarged left level 2/3 lymph nodes, new from the prior neck CT of 11/12/2019. These lymph nodes measure up to 1.7 cm in short axis. Additionally, a right level 2 lymph node has slightly increased in size, but remains subcentimeter in short axis (9 mm). Nodal metastatic disease cannot be excluded. Consider PET-CT for further evaluation.   Unchanged nonspecific 11 mm cystic appearing cutaneous/subcutaneous lesion within the posterior right lower neck.   Unchanged nonspecific 7 mm cystic appearing cutaneous/subcutaneous lesion along the right external ear.   Paranasal sinus disease as described.   Redemonstrated extensive postoperative changes to the cervical spine with superimposed spondylosis.     Impression and Plan:  60 year old man with:   1.   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  He is status post surgical resection in 2018.     He has not received any additional therapy at this time.  Imaging studies obtained in May 2022 were reviewed again personally and discussed with the patient.  Treatment choices were also discussed.  It remains unclear whether he has indeed recurrent disease versus treatment related effects including surgery.  The role for systemic therapy was discussed including single agent immunotherapy as well as immunotherapy tablets.  To further elucidate the nature of his disease I recommended PET scan which was ordered for him but was not able to complete.  Complication associated with immunotherapy single agent" were reviewed including immune mediated issues related pneumonitis, colitis and thyroid disease.  Complication related to his  cancer include  locally advanced presentation with pain and dysphagia.   After discussion today, his facial swelling has subsided without any masses or lesions.  He opted to defer the option for PET scan unless any more lesions have been detected.  We will continue with clinical evaluation every 3 months.     2.  Follow-up: In 3 months for repeat follow-up.      30  minutes were dedicated to this visit. The time was spent on reviewing  imaging studies, discussing treatment options, addressing complications related to his cancer and cancer therapy.   Zola Button, MD 01/03/2021 10:35 AM

## 2021-02-27 ENCOUNTER — Encounter (HOSPITAL_BASED_OUTPATIENT_CLINIC_OR_DEPARTMENT_OTHER): Payer: Medicaid Other | Attending: Internal Medicine | Admitting: Internal Medicine

## 2021-03-04 NOTE — Progress Notes (Signed)
Patient: Gregory [name]  Service Category: E/M  Provider: Gaspar Cola, MD  DOB: 12-13-1960  DOS: 03/08/2021  Referring Provider: Almyra Free, MD  MRN: [id number]  Setting: Ambulatory outpatient  PCP: [name], MD  Type: New Patient  Specialty: Interventional Pain Management    Location: Office  Delivery: Face-to-face     Primary Reason(s) for Visit: Encounter for initial evaluation of one or more chronic problems (new to examiner) potentially causing chronic pain, and posing a threat to normal musculoskeletal function. (Level of risk: High) CC: Leg Pain (Right stump pain and right foot phantom pain)  HPI  Gregory [name] is a 60 y.o. year old, male patient, who comes for the first time to our practice referred by Almyra Free, MD for our initial evaluation of his chronic pain. He has MRSA bacteremia; COPD (chronic obstructive pulmonary disease) ([place]); Acute osteomyelitis of left foot ([place]); Sepsis with acute renal failure without septic shock ([place]); Chronic diastolic CHF (congestive heart failure) ([place]); Pulmonary hypertension ([place]); Urinary retention; Diskitis; Foot drop, bilateral; Hypertensive heart disease with congestive heart failure ([place]); Spinal stenosis in cervical region; Type II diabetes mellitus with neurological manifestations ([place]); GERD without esophagitis; Dyslipidemia associated with type 2 diabetes mellitus ([place]); Chronic pain; Diabetic ulcer of toe of left foot ([place]); Epidural abscess; Depression, recurrent ([place]); Osteomyelitis ([place]); Skin cancer; Idiopathic chronic venous hypertension of left lower extremity with ulcer and inflammation ([place]); Great toe amputation status, left; Septic arthritis ([place]); Moderate protein-calorie malnutrition ([place]); Cellulitis of left leg; Cutaneous abscess of left foot; Pressure injury of skin; Streptococcal infection; Amputated toe of left foot ([place]); Necrosis of toe ([place]); 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[place]); Candidiasis;  Dehiscence of amputation stump ([place]); Above knee amputation of right lower extremity ([place]); Amputation stump infection ([place]); Lymphedema of left leg; Diabetic polyneuropathy associated with type 2 diabetes mellitus (HCC); S/P AKA (above knee amputation) unilateral, right ([place]); CKD (chronic kidney disease), stage III ([place]); Tobacco use; Sepsis ([place]); Wound infection; Abscess; Acute respiratory failure with hypoxia ([place]); Acute respiratory disease due to COVID-19 virus; Melanoma of cheek ([place]); Chronic low back pain; Chronic multifocal osteomyelitis, multiple sites ([place]); Enlarged lymph node in neck; Malignant melanoma of face ([place]); and Pneumonia on their problem list. Today he comes in for evaluation of his Leg Pain (Right stump pain and right foot phantom pain)  Pain Assessment: Location: Right Leg Radiating: right stump pain  and right foot  phantom pain Onset: More than a month ago Duration: Chronic pain Quality: Discomfort Severity: 6 /10 (subjective, self-reported pain score)  Effect on ADL: The pain is awful Timing: Constant Modifying factors: rubbing it helps some BP: (!) 145/68  HR: 62  Onset and Duration: Sudden and Present longer than 3 months Cause of pain:  AKA right Severity: Getting worse, NAS-11 at its worse: 10/10, NAS-11 at its best: 5/10, NAS-11 now: 7/10, and NAS-11 on the average: 7/10 Timing: Morning and Afternoon Aggravating Factors:  nothing specific Alleviating Factors:  rubbing it, muscle relaxer Associated Problems: Pain that wakes patient up Quality of Pain: Dreadful Previous Examinations or Tests: The patient denies none Previous Treatments: The patient denies none  The patient comes into the clinics today for the first time indicating that his primary area of pain is that of his right above-knee amputation stump.  In addition, the patient indicates also experiencing some phantom limb pain in his right foot.  According to the patient he has a longstanding  history of low back pain, smoking, and alcohol  consumption while he was younger and this combined with severe diabetes and peripheral vascular disease ended up in amputation of his right leg.  This surgery was done by [name] (orthopedic surgeon).  Unfortunately, due to the patient's decreased immune system, he developed an osteomyelitis of the stump that require 7 different surgeries ending up with an above-knee amputation.  He describes that the pain is intermittent but when he does have the pain is rather severe and it seems to be located in the lateral and posterior aspect of his leg/stump.  Over the years he has used opioid analgesics to control his pain.  The use of these opioid analgesics has been regular and nonstop for over 20 years, leading to significant opioid tolerance.  He is currently being treated at [facility] pain clinic with as much as 30 mg of oxycodone every 8 hours and 5 mg for breakthrough pain, also every 8 hours.  In addition to this, the patient indicates being treated with Lyrica 150 mg p.o. twice daily down from 300 mg p.o. twice daily that he used to take in the past.  Interestingly, when looking at the PMP, I see that there is a prescription for oxycodone IR 30 mg tablets (#9) with apparent instructions to take every 8 hours, but only for 3 days.  There is also another prescription for oxycodone IR 5 mg tablets (# 9) to also take every 8 hours, but for only 3 days.  According to the PMP the last time that this patient had the medication failed was on 12/27/2019.  I am not entirely sure how to interpret this since the report from the patient indicates that he is taking and has been taking this medication regularly, but what is reported on the PMP seems to be that he has not received this medication for quite some time.  Apparently, the patient has been staying at a nursing home or rehab facility and is planning on moving with his daughter.  Apparently nursing facilities do not report  the patient's opioid intake to the PMP as pharmacy is do.  In any case, this would indicate that the patient takes an average of 105 mg/day of oxycodone, which has an ache equivalency of 157.5 MME/day.  This demonstrates a significant amount of tolerance to opioids.  Not only that, but during today's appointment he indicates that he was sent here to see if we could prescribe for him 1 more oxycodone 30 mg pill so that he could get 4/day instead of 3/day.  I took this opportunity to explain to the patient the concept of opioid tolerance and drug holidays.  In addition, I made it clear that I would not be doing that or recommending that to be done.  In fact, what I recommend is to have the patient slowly taper his opioids down until he can completely come off of them for a period no shorter than 2 to 3 weeks.  After that, he should be reevaluated to see if there is still the need for the use of those medicines.  Regarding his pain, I have explained to him that I specialize in interventional therapies and therefore my evaluation today will be aiming at what type of therapy I may be able to offer him to get some of this pain under control.  However, as it turns out, he has an extensive history of complications secondary to medical interventions.  He has a history of infections of his stump, cutaneous abscesses, dehiscence of the  stump while he was healing, as well as epidural abscesses, osteomyelitis, septic arthritis, candidiasis, MRSA bacteremia, staph infections, and sepsis.  To make matters more complicated he seems to have insulin-dependent diabetes with history of being difficult to get it under control and therefore being a very poor candidate for steroid injections, or any other type of injections.  For this reason and after carefully having evaluated this patient's case, I have informed him that there is not much that I can offer him at this time and therefore I will not be scheduling him to return.  I  did inform him that I would recommend the "Drug Holiday", but before leaving our facility he told the nurse that he did not want me to recommend any "Drug Holidays" or decreases in his pain medicine.  Historic Controlled Substance Pharmacotherapy Review  PMP and historical list of controlled substances: Oxycodone IR 30 mg, 1 tab p.o. every 8 hours + oxycodone IR 5 mg 1 tab p.o. every 8 hours PRN for breakthrough pain; Lyrica 150 mg p.o. twice daily Current opioid analgesics: .   Oxycodone IR 30 mg, 1 tab p.o. every 8 hours + oxycodone IR 5 mg 1 tab p.o. every 8 hours PRN for breakthrough pain (105 mg/day of oxycodone) MME/day: 157 mg/day   Historical Monitoring: The patient  reports no history of drug use. List of all UDS Test(s): Lab Results  Component Value Date   COCAINSCRNUR NONE DETECTED 11/07/2015   THCU NONE DETECTED 11/07/2015   ETH <5 11/07/2015   List of other Serum/Urine Drug Screening Test(s):  Lab Results  Component Value Date   COCAINSCRNUR NONE DETECTED 11/07/2015   THCU NONE DETECTED 11/07/2015   ETH <5 11/07/2015   Historical Background Evaluation: [place] PMP: PDMP reviewed during this encounter. Online review of the past 38-monthperiod conducted.             PMP NARX Score Report: (This report is highly inaccurate due to the fact that the nursing home does not report to the PMP) Narcotic: 060 Sedative: 040 Stimulant: 000  Department of public safety, offender search: (Editor, commissioningInformation) Non-contributory Risk Assessment Profile: PMP NARX Overdose Risk Score: 290 Personal History of Substance Abuse (SUD-Substance use disorder):  Alcohol: Negative  Illegal Drugs: Negative  Rx Drugs: Negative  ORT Risk Level calculation: Low Risk  Opioid Risk Tool - 03/08/21 1310       Family History of Substance Abuse   Alcohol Negative    Illegal Drugs Negative    Rx Drugs Negative      Personal History of Substance Abuse   Alcohol Negative    Illegal Drugs Negative     Rx Drugs Negative      Age   Age between 160-45years  No      History of Preadolescent Sexual Abuse   History of Preadolescent Sexual Abuse Negative or Male      Psychological Disease   Psychological Disease Negative    Depression Negative      Total Score   Opioid Risk Tool Scoring 0    Opioid Risk Interpretation Low Risk            ORT Scoring interpretation table:  Score <3 = Low Risk for SUD  Score between 4-7 = Moderate Risk for SUD  Score >8 = High Risk for Opioid Abuse   PHQ-2 Depression Scale:  Total score: 0  PHQ-2 Scoring interpretation table: (Score and probability of major depressive disorder)  Score 0 = No  depression  Score 1 = 15.4% Probability  Score 2 = 21.1% Probability  Score 3 = 38.4% Probability  Score 4 = 45.5% Probability  Score 5 = 56.4% Probability  Score 6 = 78.6% Probability   PHQ-9 Depression Scale:  Total score: 0  PHQ-9 Scoring interpretation table:  Score 0-4 = No depression  Score 5-9 = Mild depression  Score 10-14 = Moderate depression  Score 15-19 = Moderately severe depression  Score 20-27 = Severe depression (2.4 times higher risk of SUD and 2.89 times higher risk of overuse)   Pharmacologic Plan:  Today I have informed the patient that we will not be taking over his medication management.             Initial impression:  Unfortunately this patient has 2 very distinct problems.  1 is the pain and what is currently causing it and the second 1 is his opioids and a significant amount of tolerance to them.  Meds   Current Outpatient Medications:    acetaminophen (TYLENOL) 325 MG tablet, Take 650 mg by mouth every 4 (four) hours as needed (general discomfort). , Disp: , Rfl:    albuterol (PROVENTIL HFA;VENTOLIN HFA) 108 (90 Base) MCG/ACT inhaler, Inhale 2 puffs into the lungs every 6 (six) hours as needed for wheezing or shortness of breath., Disp: , Rfl:    alum & mag hydroxide-simeth (MAALOX PLUS) 400-400-40 MG/5ML suspension, Take  by mouth., Disp: , Rfl:    amLODipine (NORVASC) 10 MG tablet, Take 10 mg by mouth daily. for HTN, Disp: , Rfl:    buPROPion (WELLBUTRIN XL) 300 MG 24 hr tablet, Take 300 mg by mouth at bedtime., Disp: , Rfl:    cetirizine (ZYRTEC) 10 MG tablet, Take 10 mg by mouth daily., Disp: , Rfl:    Cholecalciferol (VITAMIN D) 50 MCG (2000 UT) tablet, Take 2,000 Units by mouth daily., Disp: , Rfl:    Cholecalciferol (VITAMIN D-3) 125 MCG (5000 UT) TABS, Take 1 tablet by mouth daily., Disp: , Rfl:    Cholecalciferol (VITAMIN D-3) 5000 units TABS, Take 5,000 Units by mouth daily., Disp: , Rfl:    diphenhydrAMINE (SOMINEX) 25 MG tablet, Take by mouth., Disp: , Rfl:    docusate sodium (COLACE) 100 MG capsule, Take 1 capsule (100 mg total) by mouth 2 (two) times daily., Disp: 10 capsule, Rfl: 0   eszopiclone (LUNESTA) 1 MG TABS tablet, Take 1 mg by mouth at bedtime. Take immediately before bedtime, Disp: , Rfl:    Eszopiclone 3 MG TABS, Take 3 mg by mouth at bedtime., Disp: , Rfl:    ferrous sulfate 325 (65 FE) MG tablet, Take 1 tablet (325 mg total) by mouth daily with breakfast., Disp: 30 tablet, Rfl: 0   finasteride (PROSCAR) 5 MG tablet, Take 5 mg by mouth daily., Disp: , Rfl:    furosemide (LASIX) 40 MG tablet, Take by mouth., Disp: , Rfl:    glimepiride (AMARYL) 2 MG tablet, Take by mouth., Disp: , Rfl:    glipiZIDE (GLUCOTROL) 10 MG tablet, Take 10 mg by mouth daily before breakfast., Disp: , Rfl:    insulin aspart (NOVOLOG) 100 UNIT/ML injection, Inject 0-9 Units into the skin 3 (three) times daily with meals., Disp: 8.1 mL, Rfl: 0   insulin glargine (LANTUS) 100 UNIT/ML injection, Inject into the skin., Disp: , Rfl:    insulin lispro (HUMALOG) 100 UNIT/ML KwikPen, Inject 16 Units into the skin 3 (three) times daily before meals. , Disp: , Rfl:  LYRICA 300 MG capsule, Take 150 mg by mouth 2 (two) times daily., Disp: , Rfl:    magnesium oxide (MAG-OX) 400 MG tablet, Take 400 mg by mouth 2 (two) times  daily., Disp: , Rfl:    metoprolol tartrate (LOPRESSOR) 25 MG tablet, Take 0.5 tablets (12.5 mg total) by mouth 2 (two) times daily., Disp: 30 tablet, Rfl: 0   mirabegron ER (MYRBETRIQ) 50 MG TB24 tablet, Take 50 mg by mouth daily., Disp: , Rfl:    mometasone (NASONEX) 50 MCG/ACT nasal spray, Place 2 sprays into the nose at bedtime. , Disp: , Rfl:    omeprazole (PRILOSEC) 20 MG capsule, Take 40 mg by mouth daily before breakfast. , Disp: , Rfl:    ondansetron (ZOFRAN) 4 MG tablet, Take by mouth., Disp: , Rfl:    oxybutynin (DITROPAN-XL) 10 MG 24 hr tablet, Take 10 mg by mouth at bedtime., Disp: , Rfl:    oxycodone (OXY-IR) 5 MG capsule, Take 5 mg by mouth every 8 (eight) hours., Disp: , Rfl:    oxycodone (ROXICODONE) 30 MG immediate release tablet, Take 30 mg by mouth every 8 (eight) hours as needed., Disp: , Rfl:    PARoxetine (PAXIL) 10 MG tablet, Take 10 mg by mouth at bedtime. , Disp: , Rfl:    promethazine (PHENERGAN) 25 MG tablet, Take by mouth., Disp: , Rfl:    rosuvastatin (CRESTOR) 10 MG tablet, Take 10 mg by mouth at bedtime., Disp: , Rfl:    senna-docusate (SENOKOT-S) 8.6-50 MG tablet, Take 2 tablets by mouth at bedtime., Disp: , Rfl:    tamsulosin (FLOMAX) 0.4 MG CAPS capsule, Take 0.4 mg by mouth every evening. , Disp: , Rfl:    Tiotropium Bromide Monohydrate 1.25 MCG/ACT AERS, Inhale 2 puffs into the lungs daily., Disp: , Rfl:    triamcinolone (KENALOG) 0.025 % ointment, Apply 1 application topically 2 (two) times daily., Disp: 30 g, Rfl: 5   vitamin B-12 (CYANOCOBALAMIN) 500 MCG tablet, Take 1,000 mcg by mouth daily. , Disp: , Rfl:    vitamin C (ASCORBIC ACID) 250 MG tablet, Take 500 mg by mouth 2 (two) times daily., Disp: , Rfl:    zinc sulfate 220 (50 Zn) MG capsule, Take 220 mg by mouth daily., Disp: , Rfl:    naloxone (NARCAN) 4 MG/0.1ML LIQD nasal spray kit, Place into the nose., Disp: , Rfl:   Imaging Review  Cervical Imaging: Cervical MR wo contrast: Results for orders  placed during the hospital encounter of 07/17/14 MR Cervical Spine Wo Contrast  Narrative CLINICAL DATA:  Numbness in the fingers of both hands. Bilateral upper extremity radiculopathy. Cervical radiculopathy.  EXAM: MRI CERVICAL SPINE WITHOUT CONTRAST  TECHNIQUE: Multiplanar, multisequence MR imaging of the cervical spine was performed. No intravenous contrast was administered.  COMPARISON:  None.  FINDINGS: Alignment: Reversal of the normal cervical lordosis with the apex around C5-C6. No spondylolisthesis. Mild levoconvex torticollis.  Vertebrae: Bone marrow signal shows suppression of normal fatty marrow. This is a nonspecific finding most commonly associated with obesity, anemia, cigarette smoking or chronic disease. No aggressive osseous lesions or fractures.  Cord: No intramedullary lesions or edema.  Posterior Fossa: Grossly normal.  Vertebral Arteries: Flow voids present bilaterally.  Paraspinal tissues: Within normal limits.  Disc levels:  The spinal canal is congenitally narrow. There is superimposed degenerative disease. Diffuse disc desiccation throughout the cervical spine.  C2-C3: RIGHT foraminal stenosis associated with RIGHT facet arthrosis and uncovertebral spurring. Central canal and LEFT foramen patent.  C3-C4:  RIGHT eccentric disc osteophyte complex. Mild central stenosis. The LEFT neural foramen is patent. There is RIGHT foraminal stenosis due to uncovertebral spurring and short pedicles.  C4-C5: Central canal appears patent. Shallow broad-based disc bulging. Mild RIGHT foraminal stenosis due to uncovertebral spurring and short pedicles. LEFT foramen patent.  C5-C6: Severe central stenosis. LEFT eccentric broad-based disc osteophyte complex. Effacement of the subarachnoid space. Bilateral foraminal stenosis due to uncovertebral spurring potentially affecting both C6 nerves.  C6-C7: Moderate central stenosis is multifactorial.  Broad-based posterior disc osteophyte complex and posterior ligamentum flavum redundancy. Bilateral foraminal stenosis due to short pedicles and bilateral uncovertebral spurring. This potentially affects both C7 nerves.  C7-T1: Disc desiccation. LEFT foraminal protrusion and osteophytes with severe LEFT foraminal stenosis potentially affecting the LEFT C8 nerve. There is mild central stenosis associated with a shallow LEFT paracentral disc protrusion that extends into the LEFT neural foramen.  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 The central stenosis is most pronounced at C5-C6. Multilevel foraminal stenosis detailed above likely accounting for the bilateral upper extremity cervical radiculopathy.   Electronically Signed By: Dereck Ligas M.D. On: 07/17/2014 16:34  Cervical MR w/wo contrast: Results for orders placed during the hospital encounter of 11/07/15 MR Cervical Spine W Wo Contrast  Narrative CLINICAL DATA:  Urinary tract infection, persistent fever. History of MRSA osteomyelitis and bacteremia, diabetes, hypertension.  EXAM: MRI CERVICAL SPINE WITHOUT AND WITH CONTRAST  TECHNIQUE: Multiplanar and multiecho pulse sequences of the cervical spine, to include the craniocervical junction and cervicothoracic junction, were obtained according to standard protocol without and with intravenous contrast. Patient was unable to tolerate further imaging, and post gadolinium axial sequences not obtained.  CONTRAST:  20 cc MultiHance  COMPARISON:  None.  FINDINGS: ALIGNMENT: Focal kyphosis C5-6.  No malalignment.  VERTEBRAE/DISCS: Destructive C5-6 endplate changes and mild height loss associated with obliterated disc and effusion, associated abnormal enhancement within the C5-6 endplates and disc. The remaining intervertebral discs demonstrate relatively preserved morphology and signal. Mild chronic discogenic endplate  changes at multiple levels. No STIR signal abnormality to suggest fracture.  CORD:Mild T2 bright cord edema C4-5 through C6-7 without syrinx. No abnormal cord enhancement.  POSTERIOR FOSSA, VERTEBRAL ARTERIES, PARASPINAL TISSUES: Minimal dorsal dural enhancement at C5-6 is well as trace is ventral epidural fluid collection at C5-6 compatible with abscess. Enhancing prevertebral phlegmon. Enhancing bright STIR signal within the C5-6 interspinous space extending to the paraspinal soft tissues compatible with myositis. Life-support lines in place. Vertebral artery flow voids present.  DISC LEVELS:  C2-3: Uncovertebral hypertrophy without canal stenosis. Mild RIGHT neural foraminal narrowing.  C3-4: Small RIGHT central to subarticular disc protrusion. Uncovertebral hypertrophy. Mild canal stenosis. Moderate to severe RIGHT and moderate LEFT neural foraminal narrowing.  C4-5: Annular bulging, uncovertebral hypertrophy and tiny central disc protrusion. Mild canal stenosis. Moderate RIGHT neural foraminal narrowing.  C5-6: Retropulsed disc material and uncal vertebral hypertrophy results in severe canal stenosis: AP dimension of the canal is 6 mm. Severe bilateral neural foraminal narrowing.  C6-7: Small broad-based disc bulge, uncovertebral hypertrophy resulting in moderate canal stenosis. Severe bilateral neural foraminal narrowing.  C7-T1: Small LEFT subarticular disc protrusion. No canal stenosis. Moderate to severe LEFT neural foraminal narrowing.  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 Associated paraspinal muscle edema/myositis.  Severe canal stenosis at C5-6. C4-5 through C6-7 spinal cord edema/pre syrinx. No abnormal enhancement of the spinal cord.  Moderate canal stenosis at C6-7, mild at C3-4 and C4-5. Multilevel neural  foraminal narrowing: Severe at C5-6 and C6-7.   Electronically Signed By: Elon Alas M.D. On:  11/11/2015 05:51  Cervical DG 1 view: Results for orders placed during the hospital encounter of 11/07/15 DG Cervical Spine 1 View  Narrative CLINICAL DATA:  Anterior C5-6 corpectomy and fixation.  EXAM: CERVICAL SPINE 1 VIEW  COMPARISON:  MRI of 11/11/2015  FINDINGS: Anterior fixation beginning at C4 and extending through the C7 level. C5-6 corpectomy. No acute hardware complication identified. More inferiorly is not imaged.  IMPRESSION: Intraoperative imaging.   Electronically Signed By: Abigail Miyamoto M.D. On: 11/25/2015 20:39  Cervical DG 2-3 views: Results for orders placed during the hospital encounter of 11/07/15 DG Cervical Spine 2 or 3 views  Narrative CLINICAL DATA:  60 year old male with a history of neck pain after surgery  EXAM: CERVICAL SPINE - 2-3 VIEW  COMPARISON:  Intraoperative fluoroscopic spot images 11/25/2015  FINDINGS: Postoperative changes of C5 corpectomy with anterior plate and screw fixation spanning C4-C6. Note that the lower levels are not well evaluated secondary to overlying soft tissues.  Expandable titanium cage appears well positioned.  Significant prevertebral soft tissue swelling. Gas anterior to the C4 level.  Compared to the intraoperative study, there is mild kyphosis.  IMPRESSION: Postoperative changes of C5 corpectomy with anterior plate and screw fixation of C4-C6. Note that C6 and below not well visualized secondary to overlying soft tissues.  Compared to the intraoperative fluoroscopic image there is mild increased kyphosis.  Significant prevertebral soft tissue swelling and gas, favored to be postoperative changes.  Signed,  Dulcy Fanny. Earleen Newport, DO  [facility]  [facility]   Electronically Signed By: Corrie Mckusick D.O. On: 11/28/2015 15:27  Thoracic Imaging: Thoracic MR w/wo contrast: Results for orders placed during the hospital encounter of 11/07/15 MR  Thoracic Spine W Wo Contrast  Narrative CLINICAL DATA:  Urinary tract infection, persistent fever. History of MRSA osteomyelitis and bacteremia, diabetes, hypertension.  EXAM: MRI THORACIC AND LUMBAR SPINE WITHOUT AND WITH CONTRAST  TECHNIQUE: Multiplanar and multiecho pulse sequences of the thoracic and lumbar spine were obtained without and with intravenous contrast.  CONTRAST:  20 cc MultiHance  COMPARISON:  CT abdomen and pelvis Nov 10, 2015  FINDINGS: MRI THORACIC SPINE FINDINGS (marked artifact, possibly related life-support lines in 3 tesla environment)  ALIGNMENT: Maintenance of the thoracic kyphosis. No malalignment.  VERTEBRAE/DISCS: Vertebral bodies are intact. Intervertebral disc morphology's and signal are normal. No abnormal osseous or intradiscal enhancement.  CORD: Thoracic spinal cord is normal morphology and signal characteristics to the level the conus medullaris which terminates at T12-L1.  PREVERTEBRAL AND PARASPINAL SOFT TISSUES: Small pleural effusions and enhancing probable atelectasis or, possible pneumonia.  DEGENERATIVE CHANGE: LEFT central T7-8 moderate disc protrusion resulting in mild canal stenosis. Limited assessment of neural foraminal narrowing due to poor image quality.  MRI LUMBAR SPINE FINDINGS (low signal to noise ratio, further degraded by mild patient motion.)  OSSEOUS STRUCTURES: Lumbar vertebral bodies intact. Grade 1 L4-5 anterolisthesis associated with bright STIR signal and enhancing bone marrow signal within the RIGHT L4-5 facet. Mild congenital canal narrowing on the basis of foreshortened pedicles. Mild L3-4, moderate L4-5 and mild L5-S1 disc height loss with decreased T2 signal within the lower lumbar disc compatible with mild desiccation. Mild acute on chronic discogenic endplate changes at O3-5. No STIR signal abnormality to suggest fracture. No vertebral body or intradiscal enhancement.  SPINAL CORD: Conus  medullaris terminates at T12 and appears normal morphology and  signal characteristics. Central displacement of the cauda equina due to canal stenosis. No abnormal cord, leptomeningeal enhancement.  SOFT TISSUES: Bright STIR signal versus artifact within the RIGHT psoas muscle. Paraspinal bright interstitial STIR signal.  LEVEL BY LEVEL EVALUATION:  L1-2: Annular bulging without canal stenosis or neural foraminal narrowing.  L2-3: Annular bulging. Mild facet arthropathy and ligamentum flavum redundancy without canal stenosis. Mild LEFT neural foraminal narrowing.  L3-4: Large 5 mm broad-based disc bulge. Moderate facet arthropathy and ligamentum flavum redundancy. Severe canal stenosis. Moderate to severe bilateral neural foraminal narrowing.  L4-5: Anterolisthesis. Moderate broad-based disc bulge. Severe facet arthropathy and ligamentum flavum redundancy with enhancing RIGHT facet. Severe canal stenosis. Severe RIGHT greater than LEFT neural foraminal narrowing. Abnormal signal within the RIGHT L4-5 lateral recess extend toward the RIGHT L5-S1 neural foramen, contiguous with the RIGHT facet with superimposed enhancement. 6 mm RIGHT facet synovial cyst, less likely abscess. Trace component extensive RIGHT ligamentum flavum. Bright STIR signal within the L4-5 interspinous space.  L5-S1: Small broad-based disc bulge eccentric laterally. Mild facet arthropathy and ligamentum flavum redundancy without canal stenosis. Moderate RIGHT, mild LEFT neural foraminal narrowing.  IMPRESSION: MRI THORACIC SPINE: No MR findings of infection within the thoracic spine.  Moderate LEFT central T7-8 disc protrusion resulting in mild canal stenosis.  MRI LUMBAR SPINE: Grade 1 L4-5 anterolisthesis with severe L4-5 RIGHT facet arthropathy, inflammation versus infectious bone marrow changes and facet cyst versus abscess.  Possible RIGHT psoas muscle myositis.  Severe canal stenosis L3-4 and  L4-5.  Severe L4-5 and moderate to severe L3-4 neural foraminal narrowing.   Electronically Signed By: Elon Alas M.D. On: 11/11/2015 06:13  Lumbosacral Imaging: Lumbar MR wo contrast: Results for orders placed during the hospital encounter of 07/17/14 MR Lumbar Spine Wo Contrast  Narrative CLINICAL DATA:  Low back pain. Hip and leg pain. Chronic lumbago for 8 years. Weakness in the back and legs. Bilateral lower extremity paresthesias and radiculopathy.  EXAM: MRI LUMBAR SPINE WITHOUT CONTRAST  TECHNIQUE: Multiplanar, multisequence MR imaging of the lumbar spine was performed. No intravenous contrast was administered.  COMPARISON:  Radiographs 06/22/2014.  FINDINGS: Segmentation: The numbering convention used for this exam termed L5-S1 as the last intervertebral disc space. 5 lumbar type vertebral bodies identified on prior radiographs.  Alignment: Mild dextroconvex curve with the apex at L3. Grade I anterolisthesis of L4 on L5 that appears degenerative. This measures 5 mm. Grade I retrolisthesis of L5 on S1 associated with collapse of the disc space.  Vertebrae: Bone marrow signal shows suppression of normal fatty marrow. This is a nonspecific finding most commonly associated with obesity, anemia, cigarette smoking or chronic disease. Degenerative endplate changes are present.  Conus medullaris: Normal at T12-L1.  Paraspinal tissues: Small cortical cyst in the RIGHT kidney.  Disc levels:  The spinal canal is congenitally narrow with short pedicles. There is superimposed degenerative disease resulting in stenosis.  L1-L2:  Negative.  L2-L3:  Negative.  L3-L4: Disc desiccation and degeneration with circumferential disc bulging. There is multifactorial severe center stenosis. Posterior ligamentum flavum redundancy along with the disc bulging produces severe central stenosis with bilateral subarticular stenosis. There is also severe LEFT foraminal  stenosis potentially affecting the LEFT L3 nerve in the foramen.  L4-L5: Severe multifactorial central stenosis. The disc is degenerated with uncoverage and broad-based posterior bulging. Bilateral subarticular stenosis. Moderate bilateral foraminal stenosis is present potentially affecting both L4 nerves. Moderate to severe bilateral facet arthrosis. Facet arthrosis accounts for the anterolisthesis.  L5-S1: Disc  desiccation and loss of height central canal and subarticular zones are patent. Both foramina appear adequately patent.  IMPRESSION: 1. 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 Superimposed degenerative disease. 2. Severe central stenosis at L3-L4 and L4-L5 with bilateral subarticular stenosis at both levels. 3. L3-L4 and L4-L5 bilateral foraminal stenosis, more severe on the LEFT than RIGHT.   Electronically Signed By: Dereck Ligas M.D. On: 07/17/2014 16:09  Lumbar MR w/wo contrast: Results for orders placed during the hospital encounter of 11/07/15 MR Lumbar Spine W Wo Contrast  Narrative CLINICAL DATA:  Urinary tract infection, persistent fever. History of MRSA osteomyelitis and bacteremia, diabetes, hypertension.  EXAM: MRI THORACIC AND LUMBAR SPINE WITHOUT AND WITH CONTRAST  TECHNIQUE: Multiplanar and multiecho pulse sequences of the thoracic and lumbar spine were obtained without and with intravenous contrast.  CONTRAST:  20 cc MultiHance  COMPARISON:  CT abdomen and pelvis Nov 10, 2015  FINDINGS: MRI THORACIC SPINE FINDINGS (marked artifact, possibly related life-support lines in 3 tesla environment)  ALIGNMENT: Maintenance of the thoracic kyphosis. No malalignment.  VERTEBRAE/DISCS: Vertebral bodies are intact. Intervertebral disc morphology's and signal are normal. No abnormal osseous or intradiscal enhancement.  CORD: Thoracic spinal cord is normal morphology and signal characteristics to the level the conus medullaris which  terminates at T12-L1.  PREVERTEBRAL AND PARASPINAL SOFT TISSUES: Small pleural effusions and enhancing probable atelectasis or, possible pneumonia.  DEGENERATIVE CHANGE: LEFT central T7-8 moderate disc protrusion resulting in mild canal stenosis. Limited assessment of neural foraminal narrowing due to poor image quality.  MRI LUMBAR SPINE FINDINGS (low signal to noise ratio, further degraded by mild patient motion.)  OSSEOUS STRUCTURES: Lumbar vertebral bodies intact. Grade 1 L4-5 anterolisthesis associated with bright STIR signal and enhancing bone marrow signal within the RIGHT L4-5 facet. Mild congenital canal narrowing on the basis of foreshortened pedicles. Mild L3-4, moderate L4-5 and mild L5-S1 disc height loss with decreased T2 signal within the lower lumbar disc compatible with mild desiccation. Mild acute on chronic discogenic endplate changes at T0-1. No STIR signal abnormality to suggest fracture. No vertebral body or intradiscal enhancement.  SPINAL CORD: Conus medullaris terminates at T12 and appears normal morphology and signal characteristics. Central displacement of the cauda equina due to canal stenosis. No abnormal cord, leptomeningeal enhancement.  SOFT TISSUES: Bright STIR signal versus artifact within the RIGHT psoas muscle. Paraspinal bright interstitial STIR signal.  LEVEL BY LEVEL EVALUATION:  L1-2: Annular bulging without canal stenosis or neural foraminal narrowing.  L2-3: Annular bulging. Mild facet arthropathy and ligamentum flavum redundancy without canal stenosis. Mild LEFT neural foraminal narrowing.  L3-4: Large 5 mm broad-based disc bulge. Moderate facet arthropathy and ligamentum flavum redundancy. Severe canal stenosis. Moderate to severe bilateral neural foraminal narrowing.  L4-5: Anterolisthesis. Moderate broad-based disc bulge. Severe facet arthropathy and ligamentum flavum redundancy with enhancing RIGHT facet. Severe canal  stenosis. Severe RIGHT greater than LEFT neural foraminal narrowing. Abnormal signal within the RIGHT L4-5 lateral recess extend toward the RIGHT L5-S1 neural foramen, contiguous with the RIGHT facet with superimposed enhancement. 6 mm RIGHT facet synovial cyst, less likely abscess. Trace component extensive RIGHT ligamentum flavum. Bright STIR signal within the L4-5 interspinous space.  L5-S1: Small broad-based disc bulge eccentric laterally. Mild facet arthropathy and ligamentum flavum redundancy without canal stenosis. Moderate RIGHT, mild LEFT neural foraminal narrowing.  IMPRESSION: MRI THORACIC SPINE: No MR findings of infection within the thoracic spine.  Moderate LEFT central T7-8 disc protrusion resulting in mild canal stenosis.  MRI LUMBAR SPINE:  Grade 1 L4-5 anterolisthesis with severe L4-5 RIGHT facet arthropathy, inflammation versus infectious bone marrow changes and facet cyst versus abscess.  Possible RIGHT psoas muscle myositis.  Severe canal stenosis L3-4 and L4-5.  Severe L4-5 and moderate to severe L3-4 neural foraminal narrowing.   Electronically Signed By: Elon Alas M.D. On: 11/11/2015 06:13  Knee Imaging: Knee-R DG 4 views: Results for orders placed during the hospital encounter of 11/16/18 DG Knee Complete 4 Views Right  Narrative CLINICAL DATA:  Infection and pain at amputation site below the right knee. Reported fall.  EXAM: RIGHT KNEE - COMPLETE 4+ VIEW  COMPARISON:  10/20/2018 right lower extremity radiographs  FINDINGS: Status post right lower extremity amputation at the level of the proximal tibial shaft. New soft tissue swelling throughout amputation site. New soft tissue defect overlying the tibial stump. Curvilinear lucency at the anterolateral margin of the right tibial stump suggests a nondisplaced fracture. No new osseous erosions or new periosteal reaction. No suspicious focal osseous lesions. No right knee joint  effusion. No radiopaque foreign body.  IMPRESSION: New curvilinear lucency at the anterolateral margin of the right tibial stump, suggesting a nondisplaced fracture.  New soft tissue swelling throughout the amputation site. New soft tissue defect overlying the tibial stump. No specific radiographic findings of acute osteomyelitis.   Electronically Signed By: Ilona Sorrel M.D. On: 11/16/2018 15:09  Foot Imaging: Foot-R DG Complete: Results for orders placed during the hospital encounter of 03/25/17 DG Foot Complete Right  Narrative CLINICAL DATA:  60 year old male with lower extremity wounds and pain.  EXAM: RIGHT FOOT COMPLETE - 3+ VIEW  COMPARISON:  Foot radiographs 10/02/2016 and earlier.  FINDINGS: Interval improved and largely normalized bone mineralization at the posterior calcaneus where osteolysis was noted in April.  However, there is new cortical osteolysis and destruction of the head of the right second proximal phalanx (arrows). There is involvement of the right PIP and likely also the base of the second middle phalanx.  No other cortical osteolysis in the right foot. There is increased distal foot soft tissue swelling. No subcutaneous gas.  IMPRESSION: 1. Positive for Osteomyelitis and septic joint of the right second PIP. Destruction of the head of the right second proximal phalanx since April. 2. Associated increased distal foot soft tissue swelling. No subcutaneous gas. 3. Improved appearance of the dorsal calcaneus since April.   Electronically Signed By: Genevie Ann M.D. On: 03/25/2017 15:29  Foot-L DG Complete: Results for orders placed during the hospital encounter of 06/03/18 DG Foot Complete Left  Narrative CLINICAL DATA:  Worsening infection.  EXAM: LEFT FOOT - COMPLETE 3+ VIEW  COMPARISON:  MRI 03/26/2017.  Left foot series 10/02/2016.  FINDINGS: Prior amputation left first MTP. Destructive changes noted of the distal aspect of the  proximal phalanx and proximal aspect of the middle phalanx of the left second digit. Destructive changes noted of the distal portion of the distal phalanx of the left second digit. These findings are suggestive of osteomyelitis. No other focal bony abnormality identified.  IMPRESSION: 1. Destructive changes of the proximal and middle phalanges of the left second digit suggesting osteomyelitis. Septic arthritis at the proximal interphalangeal joint space may be present.  2. Structures changes in the distal tip of the distal phalanx of the left second digit suggesting osteomyelitis.   Electronically Signed By: Marcello Moores  Register On: 06/03/2018 14:50  Complexity Note: Imaging results reviewed. Results shared with Gregory [name], using Layman's terms.  ROS  Cardiovascular: High blood pressure and Heart failure Pulmonary or Respiratory: Lung problems Neurological: Abnormal skin sensations (Peripheral Neuropathy) Psychological-Psychiatric: No reported psychological or psychiatric signs or symptoms such as difficulty sleeping, anxiety, depression, delusions or hallucinations (schizophrenial), mood swings (bipolar disorders) or suicidal ideations or attempts Gastrointestinal: No reported gastrointestinal signs or symptoms such as vomiting or evacuating blood, reflux, heartburn, alternating episodes of diarrhea and constipation, inflamed or scarred liver, or pancreas or irrregular and/or infrequent bowel movements Genitourinary: Kidney disease Hematological: Weakness due to low blood hemoglobin or red blood cell count (Anemia) Endocrine: High blood sugar requiring insulin (IDDM) Rheumatologic: Joint aches and or swelling due to excess weight (Osteoarthritis) Musculoskeletal: Negative for myasthenia gravis, muscular dystrophy, multiple sclerosis or malignant hyperthermia Work History: Disabled  Allergies  Gregory [name] has No Known Allergies.  Laboratory Chemistry Profile    Renal Lab Results  Component Value Date   BUN 26 (H) 02/24/2020   CREATININE 1.95 (H) 02/24/2020   GFRAA 42 (L) 02/24/2020   GFRNONAA 36 (L) 02/24/2020   PROTEINUR 30 (A) 06/22/2019     Electrolytes Lab Results  Component Value Date   NA 137 02/24/2020   K 4.5 02/24/2020   CL 101 02/24/2020   CALCIUM 9.0 02/24/2020   MG 2.5 (H) 07/15/2019   PHOS 4.1 11/28/2015     Hepatic Lab Results  Component Value Date   AST 8 (L) 02/24/2020   ALT 8 02/24/2020   ALBUMIN 3.1 (L) 02/24/2020   ALKPHOS 94 02/24/2020   LIPASE 13 01/12/2017   AMMONIA 26 11/07/2015     ID Lab Results  Component Value Date   HIV NON REACTIVE 06/23/2019   SARSCOV2NAA NEGATIVE 06/25/2019   STAPHAUREUS POSITIVE (A) 05/28/2019   MRSAPCR POSITIVE (A) 05/28/2019   HCVAB NON REACTIVE 05/27/2019     Bone No results found for: VD25OH, VD125OH2TOT, YW7371GG2, IR4854OE7, 25OHVITD1, 25OHVITD2, 25OHVITD3, TESTOFREE, TESTOSTERONE   Endocrine Lab Results  Component Value Date   GLUCOSE 224 (H) 02/24/2020   GLUCOSEU NEGATIVE 06/22/2019   HGBA1C 8.6 (H) 05/23/2019   CRTSLPL 21.0 11/07/2015     Neuropathy Lab Results  Component Value Date   VITAMINB12 495 07/15/2019   FOLATE 9.3 07/15/2019   HGBA1C 8.6 (H) 05/23/2019   HIV NON REACTIVE 06/23/2019     CNS No results found for: COLORCSF, APPEARCSF, RBCCOUNTCSF, WBCCSF, POLYSCSF, LYMPHSCSF, EOSCSF, PROTEINCSF, GLUCCSF, JCVIRUS, CSFOLI, IGGCSF, LABACHR, ACETBL, LABACHR, ACETBL   Inflammation (CRP: Acute  ESR: Chronic) Lab Results  Component Value Date   CRP 14.3 (H) 07/15/2019   ESRSEDRATE 102 (H) 05/27/2019   LATICACIDVEN 1.2 07/11/2019     Rheumatology No results found for: RF, ANA, LABURIC, URICUR, LYMEIGGIGMAB, LYMEABIGMQN, HLAB27   Coagulation Lab Results  Component Value Date   INR 1.0 06/22/2019   LABPROT 13.0 06/22/2019   APTT 35 11/15/2015   PLT 221 02/24/2020   DDIMER 3.67 (H) 07/15/2019     Cardiovascular Lab Results  Component  Value Date   BNP 74.8 07/11/2019   TROPONINI 0.03 11/07/2015   HGB 10.7 (L) 02/24/2020   HCT 33.8 (L) 02/24/2020     Screening Lab Results  Component Value Date   SARSCOV2NAA NEGATIVE 06/25/2019   COVIDSOURCE NASOPHARYNGEAL 12/08/2018   STAPHAUREUS POSITIVE (A) 05/28/2019   MRSAPCR POSITIVE (A) 05/28/2019   HCVAB NON REACTIVE 05/27/2019   HIV NON REACTIVE 06/23/2019     Cancer No results found for: CEA, CA125, LABCA2   Allergens No results found for: ALMOND, APPLE, ASPARAGUS, AVOCADO, BANANA,  BARLEY, BASIL, BAYLEAF, GREENBEAN, LIMABEAN, WHITEBEAN, BEEFIGE, REDBEET, BLUEBERRY, BROCCOLI, CABBAGE, MELON, CARROT, CASEIN, CASHEWNUT, CAULIFLOWER, CELERY     Note: Lab results reviewed.  [place]  Drug: Gregory [name]  reports no history of drug use. Alcohol:  reports no history of alcohol use. Tobacco:  reports that he has been smoking cigarettes. He has been smoking an average of .5 packs per day. He has never used smokeless tobacco. Medical:  has a past medical history of Acquired absence of left great toe [facility]), Acquired absence of right leg below knee [facility]), Acute osteomyelitis of calcaneum, right ([place]) (10/02/2016), Acute respiratory failure ([place]), Amputation stump infection ([place]) (08/12/2018), Anemia, Basal cell carcinoma, eyelid, Cancer ([place]), Candidiasis (08/12/2018), CHF (congestive heart failure) (HCC), Chronic back pain, Chronic kidney disease, Chronic multifocal osteomyelitis ([place]), Chronic pain syndrome, COPD (chronic obstructive pulmonary disease) ([place]), DDD (degenerative disc disease), lumbar, Depression, Diabetes mellitus without complication ([place]), Diabetic ulcer of toe of left foot ([place]) (10/02/2016), Difficulty in walking, not elsewhere classified, 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), Foot drop, bilateral, Foot drop, right (12/27/2015), GERD (gastroesophageal reflux disease), Herpesviral vesicular dermatitis, Hyperlipidemia, Hyperlipidemia, Hypertension, Insomnia, Malignant melanoma of other parts  of face [facility]), MRSA bacteremia, Muscle weakness (generalized), Nasal congestion, Necrosis of toe ([place]) (07/08/2018), Neuromuscular dysfunction of bladder, Osteomyelitis ([place]), Osteomyelitis ([place]), Osteomyelitis ([place]), Other idiopathic peripheral autonomic neuropathy, Retention of urine, unspecified, Squamous cell carcinoma of skin, Unsteadiness on feet, Urinary retention, Urinary retention, Urinary tract infection, Wears dentures, and Wears glasses. Family: family history includes Bone cancer in his father; Cancer in his father, mother, paternal grandfather, and another family member; Diabetes in his mother and another family member; Heart disease in his mother; Prostate cancer in his father.  Past Surgical History:  Procedure Laterality Date   AMPUTATION Left 03/29/2017   Procedure: LEFT GREAT TOE AMPUTATION AT METATARSOPHALANGEAL JOINT;  Surgeon: [name], MD;  Location: [place];  Service: Orthopedics;  Laterality: Left;   AMPUTATION Right 03/29/2017   Procedure: RIGHT BELOW KNEE AMPUTATION;  Surgeon: [name], MD;  Location: [place];  Service: Orthopedics;  Laterality: Right;   AMPUTATION Left 06/06/2018   Procedure: LEFT 2ND TOE AMPUTATION;  Surgeon: [name], MD;  Location: [place];  Service: Orthopedics;  Laterality: Left;   AMPUTATION Right 11/19/2018   Procedure: AMPUTATION ABOVE KNEE;  Surgeon: [name], MD;  Location: [place];  Service: Orthopedics;  Laterality: Right;   ANTERIOR CERVICAL CORPECTOMY N/A 11/25/2015   Procedure: ANTERIOR CERVICAL FIVE CORPECTOMY Cervical four - six fusion;  Surgeon: Consuella Lose, MD;  Location: [place] [facility];  Service: Neurosurgery;  Laterality: N/A;  ANTERIOR CERVICAL FIVE CORPECTOMY Cervical four - six fusion   APPLICATION OF WOUND VAC Right 10/22/2018   Procedure: Application Of Wound Vac;  Surgeon: [name], MD;  Location: [place];  Service: Orthopedics;  Laterality: Right;   CYSTOSCOPY WITH RETROGRADE URETHROGRAM N/A 04/25/2018    Procedure: CYSTOSCOPY WITH RETROGRADE URETHROGRAM/ BALLOON DILATION;  Surgeon: Kathie Rhodes, MD;  [facility];  Service: Urology;  Laterality: N/A;   MULTIPLE TOOTH EXTRACTIONS     POSTERIOR CERVICAL FUSION/FORAMINOTOMY N/A 11/29/2015   Procedure: Cervical Three-Cervical Seven Posterior Cervical Laminectomy with Fusion;  Surgeon: Consuella Lose, MD;  Location: [place] [facility];  Service: Neurosurgery;  Laterality: N/A;  Cervical Three-Cervical Seven Posterior Cervical Laminectomy with Fusion   STUMP REVISION Right 10/22/2018   Procedure: REVISION RIGHT BELOW KNEE AMPUTATION;  Surgeon: [name], MD;  Location: [place];  Service: Orthopedics;  Laterality: Right;  STUMP REVISION Right 12/05/2018   Procedure: Revision Right Above Knee Amputation;  Surgeon: [name], MD;  Location: [place];  Service: Orthopedics;  Laterality: Right;   STUMP REVISION Right 05/29/2019   Procedure: REVISION RIGHT ABOVE KNEE AMPUTATION;  Surgeon: [name], MD;  Location: [place];  Service: Orthopedics;  Laterality: Right;   STUMP REVISION Right 06/24/2019   Procedure: STUMP REVISION;  Surgeon: [name], MD;  Location: [place];  Service: Orthopedics;  Laterality: Right;   Active Ambulatory Problems    Diagnosis Date Noted   MRSA bacteremia 08/13/2015   COPD (chronic obstructive pulmonary disease) ([place]) 10/03/2015   Acute osteomyelitis of left foot (HCC) 10/03/2015   Sepsis with acute renal failure without septic shock (HCC)    Chronic diastolic CHF (congestive heart failure) ([place])    Pulmonary hypertension ([place])    Urinary retention    Diskitis    Foot drop, bilateral    Hypertensive heart disease with congestive heart failure ([place]) 12/15/2015   Spinal stenosis in cervical region 12/15/2015   Type II diabetes mellitus with neurological manifestations ([place]) 12/15/2015   GERD without esophagitis 05/11/2016   Dyslipidemia associated with type 2 diabetes mellitus ([place]) 06/06/2016   Chronic pain  09/27/2016   Diabetic ulcer of toe of left foot ([place]) 10/02/2016   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   Depression, recurrent ([place]) 11/05/2016   Osteomyelitis ([place]) 03/25/2017   Skin cancer 03/25/2017   Idiopathic chronic venous hypertension of left lower extremity with ulcer and inflammation ([place]) 11/14/2017   Great toe amputation status, left 11/14/2017   Septic arthritis ([place]) 06/03/2018   Moderate protein-calorie malnutrition ([place])    Cellulitis of left leg    Cutaneous abscess of left foot    Pressure injury of skin 06/06/2018   Streptococcal infection    Amputated toe of left foot (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[place]) 08/12/2018   Candidiasis 08/12/2018   Dehiscence of amputation stump ([place])    Above knee amputation of right lower extremity ([place]) 10/22/2018   Amputation stump infection ([place])    Lymphedema of left leg    Diabetic polyneuropathy associated with type 2 diabetes mellitus (HCC)    S/P AKA (above knee amputation) unilateral, right ([place]) 12/05/2018   CKD (chronic kidney disease), stage III ([place]) 05/23/2019   Tobacco use 05/23/2019   Sepsis ([place]) 05/23/2019   Wound infection 06/22/2019   Abscess 06/22/2019   Acute respiratory failure with hypoxia ([place]) 07/11/2019   Acute respiratory disease due to COVID-19 virus 07/11/2019   Melanoma of cheek ([place]) 10/15/2019   Chronic low back pain 08/13/2015   Chronic multifocal osteomyelitis, multiple sites ([place]) 08/13/2015   Enlarged lymph node in neck 07/18/2017   Malignant melanoma of face ([place]) 04/16/2017   Pneumonia 08/13/2015   Resolved Ambulatory Problems    Diagnosis Date Noted   Pneumococcal pneumonia ([place]) 03/25/2012   Acute respiratory failure ([place]) 03/25/2012   Septic shock(785.52) 03/25/2012   Diabetes ([place]) 03/25/2012   Hyperglycemia 03/25/2012   Acute encephalopathy 03/25/2012   Acute renal failure ([place]) 03/25/2012   Osteomyelitis of clavicle ([place])  10/03/2015   Lumbar discitis 10/03/2015   Septic shock ([place]) 11/07/2015   Altered mental status    Pressure ulcer 11/10/2015   Acute respiratory failure with hypoxemia (HCC)    UTI (lower urinary tract infection)    Osteomyelitis ([place])    Encounter for central line placement    Encounter  for orogastric (OG) tube placement    Endotracheally intubated    Osteomyelitis of cervical spine (HCC)    Cervical discitis    Sepsis due to methicillin resistant Staphylococcus aureus (MRSA) ([place])    CAP (community acquired pneumonia) due to MRSA (methicillin resistant Staphylococcus aureus) ([place])    Escherichia coli urinary tract infection    Essential hypertension    HLD (hyperlipidemia)    Acute renal failure (HCC)    Uncontrolled type 2 diabetes mellitus with complication (HCC)    Synovial cyst of lumbar spine    Weakness of both lower extremities    Pneumonia of both lower lobes due to methicillin resistant Staphylococcus aureus (MRSA) (HCC)    Opioid use with withdrawal (HCC)    Acute respiratory failure with hypoxia (HCC)    Abscess in epidural space of lumbar spine    Foot drop, right 12/27/2015   Acute osteomyelitis of calcaneum, right ([place]) 10/02/2016   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)    Acquired absence of right leg below knee (HCC)    Anemia    Basal cell carcinoma, eyelid    Cancer (HCC)    CHF (congestive heart failure) (HCC)    Chronic back pain    Chronic kidney disease    Chronic multifocal osteomyelitis (HCC)    Chronic pain syndrome    DDD (degenerative disc disease), lumbar    Depression    Diabetes mellitus without complication ([place])    Difficulty in walking, not elsewhere classified    GERD (gastroesophageal reflux disease)    Herpesviral vesicular dermatitis    Hyperlipidemia    Hyperlipidemia    Hypertension    Insomnia    Malignant melanoma of other parts of face (HCC)    Muscle weakness (generalized)     Nasal congestion    Neuromuscular dysfunction of bladder    Other idiopathic peripheral autonomic neuropathy    Retention of urine, unspecified    Squamous cell carcinoma of skin    Unsteadiness on feet    Urinary tract infection    Wears dentures    Wears glasses    Constitutional Exam  General appearance: Well nourished, well developed, and well hydrated. In no apparent acute distress Vitals:   03/08/21 1249  BP: (!) 145/68  Pulse: 62  Resp: 18  Temp: 98.7 F (37.1 C)  SpO2: 93%  Weight: 235 lb (106.6 kg)  Height: 6' (1.829 m)   BMI Assessment: Estimated body mass index is 31.87 kg/m as calculated from the following:   Height as of this encounter: 6' (1.829 m).   Weight as of this encounter: 235 lb (106.6 kg).  BMI interpretation table: BMI level Category Range association with higher incidence of chronic pain  <18 kg/m2 Underweight   18.5-24.9 kg/m2 Ideal body weight   25-29.9 kg/m2 Overweight Increased incidence by 20%  30-34.9 kg/m2 Obese (Class I) Increased incidence by 68%  35-39.9 kg/m2 Severe obesity (Class II) Increased incidence by 136%  >40 kg/m2 Extreme obesity (Class III) Increased incidence by 254%   Patient's current BMI Ideal Body weight  Body mass index is 31.87 kg/m. Ideal body weight: 77.6 kg (171 lb 1.2 oz) Adjusted ideal body weight: 89.2 kg (196 lb 10.3 oz)   BMI Readings from Last 4 Encounters:  03/08/21 31.87 kg/m  01/03/21 31.06 kg/m  07/06/20 32.55 kg/m  05/24/20 32.55 kg/m   Wt Readings from Last 4 Encounters:  03/08/21 235 lb (  106.6 kg)  01/03/21 229 lb (103.9 kg)  05/24/20 240 lb (108.9 kg)  03/02/20 235 lb (106.6 kg)    Psych/Mental status: Alert, oriented x 3 (person, place, & time)       Eyes: PERLA Respiratory: No evidence of acute respiratory distress  Assessment  Primary Diagnosis & Pertinent Problem List: There were no encounter diagnoses.  Visit Diagnosis (New problems to examiner): No diagnosis found. Plan of  Care (Initial workup plan)  Note: Gregory [name] was reminded that as per protocol, today's visit has been an evaluation only. We have not taken over the patient's controlled substance management.  Problem-specific plan: No problem-specific Assessment & Plan notes found for this encounter. Lab Orders  No laboratory test(s) ordered today   Imaging Orders  No imaging studies ordered today   Referral Orders  No referral(s) requested today   Procedure Orders    No procedure(s) ordered today   Pharmacotherapy (current): Medications ordered:  No orders of the defined types were placed in this encounter.  Medications administered during this visit: Lyle L. Kiesling had no medications administered during this visit.   Pharmacological management options:  Opioid Analgesics: I will not be taking over this patient's opioid therapy.  He has significant tolerance to the opioids and he is unwilling to undergo tolerance management by the use of "Drug Holidays".  Membrane stabilizer: I will not be taking over Gregory [name] medication regimen  Muscle relaxant: I will not be taking over Gregory [name] medication regimen  NSAID: I will not be taking over Gregory [name] medication regimen  Other analgesic(s): I will not be taking over Gregory [name] medication regimen   Interventional management options: Gregory [name] is an extremely poor candidate for any type of interventional therapy secondary to his decreased immune system and extensive history of complications secondary to interventional treatments.     Interventional Therapies  Risk  Complexity Considerations:   Estimated body mass index is 31.87 kg/m as calculated from the following:   Height as of this encounter: 6' (1.829 m).   Weight as of this encounter: 235 lb (106.6 kg). The patient is at a very high risk for postprocedural complications secondary to his diabetes, peripheral vascular disease, decrease and/or compromised immune system, and he is extensive prior history  of complications with interventional therapies.  In addition, he is using very high doses of opioids which we are currently not in a position to manage due to shortage in manpower.   Planned  Pending:   Pending further evaluation   Under consideration:      Completed:   None at this time   Therapeutic  Palliative (PRN) options:   None established    Provider-requested follow-up: No follow-ups on file.  Future Appointments  Date Time Provider [place]  04/06/2021  3:30 PM Wyatt Portela, MD [facility] None    Note by: Gaspar Cola, MD Date: 03/08/2021; Time: 2:48 PM

## 2021-03-08 ENCOUNTER — Other Ambulatory Visit: Payer: Self-pay

## 2021-03-08 ENCOUNTER — Encounter: Payer: Self-pay | Admitting: Pain Medicine

## 2021-03-08 ENCOUNTER — Ambulatory Visit: Payer: Medicaid Other | Attending: Pain Medicine | Admitting: Pain Medicine

## 2021-03-08 VITALS — BP 145/68 | HR 62 | Temp 98.7°F | Resp 18 | Ht 72.0 in | Wt 235.0 lb

## 2021-03-08 DIAGNOSIS — T8789 Other complications of amputation stump: Secondary | ICD-10-CM

## 2021-03-08 DIAGNOSIS — E1142 Type 2 diabetes mellitus with diabetic polyneuropathy: Secondary | ICD-10-CM

## 2021-03-08 DIAGNOSIS — M79604 Pain in right leg: Secondary | ICD-10-CM

## 2021-03-08 DIAGNOSIS — G894 Chronic pain syndrome: Secondary | ICD-10-CM

## 2021-03-08 DIAGNOSIS — M8639 Chronic multifocal osteomyelitis, multiple sites: Secondary | ICD-10-CM | POA: Diagnosis present

## 2021-03-08 DIAGNOSIS — Z89611 Acquired absence of right leg above knee: Secondary | ICD-10-CM

## 2021-03-08 DIAGNOSIS — M4642 Discitis, unspecified, cervical region: Secondary | ICD-10-CM | POA: Diagnosis present

## 2021-03-08 DIAGNOSIS — M8660 Other chronic osteomyelitis, unspecified site: Secondary | ICD-10-CM

## 2021-03-08 DIAGNOSIS — T8740 Infection of amputation stump, unspecified extremity: Secondary | ICD-10-CM | POA: Diagnosis not present

## 2021-03-08 DIAGNOSIS — G062 Extradural and subdural abscess, unspecified: Secondary | ICD-10-CM | POA: Diagnosis present

## 2021-03-15 DIAGNOSIS — M79604 Pain in right leg: Secondary | ICD-10-CM | POA: Insufficient documentation

## 2021-03-15 DIAGNOSIS — T8789 Other complications of amputation stump: Secondary | ICD-10-CM | POA: Insufficient documentation

## 2021-03-20 ENCOUNTER — Ambulatory Visit: Payer: Medicaid Other | Admitting: Pain Medicine

## 2021-03-22 ENCOUNTER — Other Ambulatory Visit: Payer: Self-pay | Admitting: Oncology

## 2021-03-22 ENCOUNTER — Telehealth: Payer: Self-pay | Admitting: *Deleted

## 2021-03-22 DIAGNOSIS — C4339 Malignant melanoma of other parts of face: Secondary | ICD-10-CM

## 2021-03-22 NOTE — Telephone Encounter (Signed)
Notified that CT is ordered

## 2021-03-22 NOTE — Telephone Encounter (Signed)
Gregory [name] called requesting an appt with Dr Alen Blew. States the place on his face is getting bigger.

## 2021-03-23 ENCOUNTER — Other Ambulatory Visit: Payer: Self-pay | Admitting: *Deleted

## 2021-03-23 ENCOUNTER — Telehealth: Payer: Self-pay | Admitting: Oncology

## 2021-03-23 DIAGNOSIS — C4339 Malignant melanoma of other parts of face: Secondary | ICD-10-CM

## 2021-03-23 NOTE — Telephone Encounter (Signed)
Per 10/5 sch msg, appt was scheduled and [facility] confirmed appt

## 2021-03-24 ENCOUNTER — Telehealth: Payer: Self-pay | Admitting: Oncology

## 2021-03-24 NOTE — Telephone Encounter (Signed)
Scheduled per 10/06 staff message, patient has been called and voicemail was left.

## 2021-03-27 ENCOUNTER — Other Ambulatory Visit: Payer: Self-pay

## 2021-03-27 ENCOUNTER — Ambulatory Visit (INDEPENDENT_AMBULATORY_CARE_PROVIDER_SITE_OTHER): Payer: Medicaid Other | Admitting: Orthopedic Surgery

## 2021-03-27 ENCOUNTER — Ambulatory Visit: Payer: Medicaid Other | Admitting: Orthopedic Surgery

## 2021-03-27 DIAGNOSIS — S78111A Complete traumatic amputation at level between right hip and knee, initial encounter: Secondary | ICD-10-CM

## 2021-03-27 DIAGNOSIS — I87332 Chronic venous hypertension (idiopathic) with ulcer and inflammation of left lower extremity: Secondary | ICD-10-CM | POA: Diagnosis not present

## 2021-03-27 DIAGNOSIS — L97929 Non-pressure chronic ulcer of unspecified part of left lower leg with unspecified severity: Secondary | ICD-10-CM | POA: Diagnosis not present

## 2021-03-28 ENCOUNTER — Encounter: Payer: Self-pay | Admitting: Orthopedic Surgery

## 2021-03-28 ENCOUNTER — Ambulatory Visit: Payer: Medicaid Other | Admitting: Family

## 2021-03-28 NOTE — Progress Notes (Addendum)
Office Visit Note   Patient: Gregory [name]           Date of Birth: 01-May-1961           MRN: [id number] Visit Date: 03/27/2021              Requested by: [name], MD [place],  [place] [id number] PCP: [name], MD  Chief Complaint  Patient presents with   Right Leg - Follow-up    Hx aka   Left Knee - Pain    S/p fall OOB open wound below knee with depth      HPI: Patient is a 60 year old gentleman currently at skilled nursing.  Has persistent pain in his right above-the-knee amputation.  Patient is currently going to a pain clinic he states he is on 30 mg of oxycodone 3 times a day 5 mg of oxycodone between these doses as well as a muscle relaxer, still has significant pain where he has to massage his leg.  Patient states he also recently fell 3 weeks ago sustaining a traumatic wound over the tibial tubercle left leg he complains of knee and leg pain.  Patient has had progressive dehiscence of the wound secondary to the massive venous and lymphatic insufficiency.  Patient states that his pain clinic referred him to a physician at [facility] for a sciatic block for the pain in the right above-the-knee amputation, patient states that the physician would not perform the block and patient requests our input to find a new doctor for a sciatic block.  Assessment & Plan: Visit Diagnoses:  1. Unilateral AKA, right ([place])   2. 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  After discussing the treatment plan and writing orders for skilled nursing to apply compression wraps twice a week patient refused having the compression wrap applied.  Patient was also upset that we did not have someone who can perform a  sciatic nerve block.  I recommended patient follow-up with his pain clinic that they should have access to another physician.  Patient states that due to our inability to provide a physician for a sciatic block he would not be coming back to the office.  Follow-Up Instructions: Return if symptoms worsen or fail to improve.   Ortho Exam  Patient is alert, oriented, no adenopathy, well-dressed, normal affect, normal respiratory effort. Examination patient has massive venous and lymphatic insufficiency with brawny edema to the left lower extremity with significant swelling and induration.  There is a traumatic wound dehiscence over the tibial tubercle that measures 1 cm diameter 3 cm in width and 1 cm depth.  Patient has no dystrophic changes to the right above-the-knee amputation there is no cellulitis no drainage patient states that massaging his leg is the only way to provide pain relief.  Imaging: No results found. No images are attached to the encounter.  Labs: Lab Results  Component Value Date   HGBA1C 8.6 (H) 05/23/2019   HGBA1C 7.7 (H) 11/17/2018   HGBA1C 8.0 (H) 06/04/2018   ESRSEDRATE 102 (H) 05/27/2019   ESRSEDRATE 120 (H) 03/25/2017   ESRSEDRATE 84 (H) 10/02/2016   CRP 14.3 (H) 07/15/2019   CRP 21.2 (H) 07/14/2019   CRP 13.7 (H) 07/13/2019   REPTSTATUS  07/16/2019 FINAL 07/11/2019   GRAMSTAIN  06/22/2019    FEW WBC PRESENT, PREDOMINANTLY PMN ABUNDANT GRAM POSITIVE COCCI MODERATE GRAM NEGATIVE RODS    CULT  07/11/2019    NO GROWTH 5 DAYS Performed at [facility], [place] [address], [place], [place] [address]    LABORGA ESCHERICHIA COLI 06/22/2019   LABORGA METHICILLIN RESISTANT STAPHYLOCOCCUS AUREUS 06/22/2019     Lab Results  Component Value Date   ALBUMIN 3.1 (L) 02/24/2020   ALBUMIN 2.6 (L) 07/15/2019   ALBUMIN 2.7 (L) 07/14/2019   PREALBUMIN 23.3 03/25/2017    Lab Results  Component Value Date   MG 2.5 (H) 07/15/2019   MG 2.3 07/14/2019   MG 2.3  07/13/2019   No results found for: VD25OH  Lab Results  Component Value Date   PREALBUMIN 23.3 03/25/2017   CBC EXTENDED Latest Ref Rng & Units 02/24/2020 07/15/2019 07/14/2019  WBC 4.0 - 10.5 K/uL 7.7 4.4 6.5  RBC 4.22 - 5.81 MIL/uL 3.76(L) 3.28(L) 3.17(L)  HGB 13.0 - 17.0 g/dL 10.7(L) 8.1(L) 7.9(L)  HCT 39.0 - 52.0 % 33.8(L) 27.5(L) 27.5(L)  PLT 150 - 400 K/uL 221 251 247  NEUTROABS 1.7 - 7.7 K/uL 5.3 3.3 5.6  LYMPHSABS 0.7 - 4.0 K/uL 1.6 0.7 0.5(L)     There is no height or weight on file to calculate BMI.  Orders:  No orders of the defined types were placed in this encounter.  No orders of the defined types were placed in this encounter.    Procedures: No procedures performed  Clinical Data: No additional findings.  ROS:  All other systems negative, except as noted in the HPI. Review of Systems  Objective: Vital Signs: There were no vitals taken for this visit.  Specialty Comments:  No specialty comments available.  PMFS History: Patient Active Problem List   Diagnosis Date Noted   Pain of amputation stump of right lower extremity ([place]) 03/15/2021   Melanoma of cheek ([place]) 10/15/2019   Acute respiratory failure with hypoxia ([place]) 07/11/2019   Acute respiratory disease due to COVID-19 virus 07/11/2019   Wound infection 06/22/2019   Abscess 06/22/2019   CKD (chronic kidney disease), stage III ([place]) 05/23/2019   Tobacco use 05/23/2019   Sepsis ([place]) 05/23/2019   S/P AKA (above knee amputation) unilateral, right ([place]) 12/05/2018   Amputation stump infection ([place])    Lymphedema of left leg    Diabetic polyneuropathy associated with type 2 diabetes mellitus ([place])    Above knee amputation of right lower extremity ([place]) 10/22/2018   Dehiscence of amputation stump (HCC)    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[place]) 08/12/2018   Candidiasis 08/12/2018   Necrosis of toe ([place]) 07/08/2018   Amputated toe of left foot ([place]) 06/18/2018   Streptococcal infection     Pressure injury of skin 06/06/2018   Cutaneous abscess of left foot    Moderate protein-calorie malnutrition ([place])    Cellulitis of left leg    Septic arthritis ([place]) 06/03/2018   Idiopathic chronic venous hypertension of left lower extremity with ulcer and inflammation ([place]) 11/14/2017   Great toe amputation status, left 11/14/2017   Enlarged lymph node in neck 07/18/2017   Malignant melanoma of face ([place]) 04/16/2017   Osteomyelitis ([place]) 03/25/2017   Skin cancer 03/25/2017   Depression, recurrent ([place]) 11/05/2016   Diabetic ulcer of toe of left foot ([place]) 10/02/2016   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   Chronic pain 09/27/2016   Dyslipidemia associated with type 2 diabetes mellitus ([place])  06/06/2016   GERD without esophagitis 05/11/2016   Hypertensive heart disease with congestive heart failure ([place]) 12/15/2015   Spinal stenosis in cervical region 12/15/2015   Type II diabetes mellitus with neurological manifestations ([place]) 12/15/2015   Chronic diastolic CHF (congestive heart failure) ([place])    Pulmonary hypertension (HCC)    Urinary retention    Diskitis    Foot drop, bilateral    Sepsis with acute renal failure without septic shock (HCC)    COPD (chronic obstructive pulmonary disease) ([place]) 10/03/2015   Acute osteomyelitis of left foot ([place]) 10/03/2015   MRSA bacteremia 08/13/2015   Chronic low back pain 08/13/2015   Chronic multifocal osteomyelitis, multiple sites ([place]) 08/13/2015   Pneumonia 08/13/2015   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)    Acquired absence of right leg below knee (HCC)    Acute osteomyelitis of calcaneum, right (HCC) 10/02/2016   Acute respiratory failure (HCC)    Amputation stump infection ([place]) 08/12/2018   Anemia    Basal cell carcinoma, eyelid    Cancer (HCC)    Candidiasis 08/12/2018   CHF (congestive heart failure) (HCC)    chronic diastolic    Chronic back pain    Chronic kidney disease    stage 3     Chronic multifocal osteomyelitis (HCC)    of ankle and foot    Chronic pain syndrome    COPD (chronic obstructive pulmonary disease) (HCC)    DDD (degenerative disc disease), lumbar    Depression    major depressive disorder    Diabetes mellitus without complication ([place])    type 2    Diabetic ulcer of toe of left foot ([place]) 10/02/2016   Difficulty in walking, not elsewhere classified    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   Foot drop, bilateral    Foot drop, right 12/27/2015   GERD (gastroesophageal reflux disease)    Herpesviral vesicular dermatitis    Hyperlipidemia    Hyperlipidemia    Hypertension    Insomnia    Malignant melanoma of other parts of face (HCC)    MRSA bacteremia    Muscle weakness (generalized)    Nasal congestion    Necrosis of toe ([place]) 07/08/2018   Neuromuscular dysfunction of bladder    Osteomyelitis (HCC)    Osteomyelitis (HCC)    Osteomyelitis (HCC)    right BKA   Other idiopathic peripheral autonomic neuropathy    Retention of urine, unspecified    Squamous cell carcinoma of skin    Unsteadiness on feet    Urinary retention    Urinary retention    Urinary tract infection    Wears dentures    Wears glasses     Family History  Problem Relation Age of Onset   Diabetes Mother    Heart disease Mother    Cancer Mother    Cancer Father    Bone cancer Father    Prostate cancer Father    Cancer Paternal Grandfather    Cancer Other    Diabetes Other     Past Surgical History:  Procedure Laterality Date   AMPUTATION Left 03/29/2017   Procedure: LEFT GREAT TOE AMPUTATION AT METATARSOPHALANGEAL JOINT;  Surgeon: [name], MD;  Location: [place];  Service: Orthopedics;  Laterality: Left;   AMPUTATION Right 03/29/2017   Procedure: RIGHT BELOW KNEE AMPUTATION;  Surgeon: [name], MD;  Location: [facility];  Service: Orthopedics;  Laterality: Right;   AMPUTATION Left  06/06/2018   Procedure: LEFT 2ND TOE AMPUTATION;  Surgeon: [name], MD;  Location:  [facility];  Service: Orthopedics;  Laterality: Left;   AMPUTATION Right 11/19/2018   Procedure: AMPUTATION ABOVE KNEE;  Surgeon: [name], MD;  Location: [facility];  Service: Orthopedics;  Laterality: Right;   ANTERIOR CERVICAL CORPECTOMY N/A 11/25/2015   Procedure: ANTERIOR CERVICAL FIVE CORPECTOMY Cervical four - six fusion;  Surgeon: Consuella Lose, MD;  Location: [facility];  Service: Neurosurgery;  Laterality: N/A;  ANTERIOR CERVICAL FIVE CORPECTOMY Cervical four - six fusion   APPLICATION OF WOUND VAC Right 10/22/2018   Procedure: Application Of Wound Vac;  Surgeon: [name], MD;  Location: [facility];  Service: Orthopedics;  Laterality: Right;   CYSTOSCOPY WITH RETROGRADE URETHROGRAM N/A 04/25/2018   Procedure: CYSTOSCOPY WITH RETROGRADE URETHROGRAM/ BALLOON DILATION;  Surgeon: Kathie Rhodes, MD;  Location: [facility];  Service: Urology;  Laterality: N/A;   MULTIPLE TOOTH EXTRACTIONS     POSTERIOR CERVICAL FUSION/FORAMINOTOMY N/A 11/29/2015   Procedure: Cervical Three-Cervical Seven Posterior Cervical Laminectomy with Fusion;  Surgeon: Consuella Lose, MD;  Location: [place];  Service: Neurosurgery;  Laterality: N/A;  Cervical Three-Cervical Seven Posterior Cervical Laminectomy with Fusion   STUMP REVISION Right 10/22/2018   Procedure: REVISION RIGHT BELOW KNEE AMPUTATION;  Surgeon: [name], MD;  Location: [place];  Service: Orthopedics;  Laterality: Right;   STUMP REVISION Right 12/05/2018   Procedure: Revision Right Above Knee Amputation;  Surgeon: [name], MD;  Location: [facility];  Service: Orthopedics;  Laterality: Right;   STUMP REVISION Right 05/29/2019   Procedure: REVISION RIGHT ABOVE KNEE AMPUTATION;  Surgeon: [name], MD;  Location: [place];  Service: Orthopedics;  Laterality: Right;   STUMP REVISION Right 06/24/2019   Procedure: STUMP REVISION;  Surgeon: [name], MD;  Location: [facility];  Service: Orthopedics;  Laterality: Right;   Social History   Occupational  History   Not on file  Tobacco Use   Smoking status: Every Day    Packs/day: 0.50    Types: Cigarettes   Smokeless tobacco: Never   Tobacco comments:    11/19/18  Vaping Use   Vaping Use: Never used  Substance and Sexual Activity   Alcohol use: No   Drug use: No   Sexual activity: Not on file

## 2021-03-30 ENCOUNTER — Other Ambulatory Visit: Payer: Self-pay | Admitting: Oncology

## 2021-03-30 ENCOUNTER — Inpatient Hospital Stay: Payer: Medicaid Other | Attending: Oncology

## 2021-03-30 ENCOUNTER — Ambulatory Visit (HOSPITAL_COMMUNITY)
Admission: RE | Admit: 2021-03-30 | Discharge: 2021-03-30 | Disposition: A | Discharge status: Home / Self Care | Payer: Medicaid Other | Source: Ambulatory Visit | Attending: Oncology | Admitting: Oncology

## 2021-03-30 ENCOUNTER — Other Ambulatory Visit: Payer: Self-pay

## 2021-03-30 DIAGNOSIS — C4339 Malignant melanoma of other parts of face: Secondary | ICD-10-CM | POA: Insufficient documentation

## 2021-03-30 LAB — POCT I-STAT CREATININE: Creatinine, Ser: 1.9 mg/dL — ABNORMAL HIGH (ref 0.61–1.24)

## 2021-03-30 MED ORDER — IOHEXOL 350 MG/ML SOLN: 75.0000 mL | Freq: Once | INTRAVENOUS | Status: DC | PRN

## 2021-04-06 ENCOUNTER — Ambulatory Visit: Payer: Medicaid Other | Admitting: Oncology

## 2021-04-06 ENCOUNTER — Inpatient Hospital Stay: Payer: Medicaid Other | Admitting: Oncology

## 2021-04-13 ENCOUNTER — Inpatient Hospital Stay: Payer: Medicaid Other | Admitting: Oncology

## 2021-04-13 NOTE — Progress Notes (Incomplete)
Hematology and Oncology Follow Up  Gregory [name] [id number] [birth year] 60 y.o. 04/13/2021 10:23 AM [name], MDTejan-Sie, Brandt Loosen, MD       Principle Diagnosis: 60 year old man with cutaneous melanoma of the right cheek diagnosed in 2018.  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       Prior Therapy:   He is status post surgical resection Dr. Crisoforo Oxford at [facility] in 2018.     He was noted to have a lesion on his right cheek in December 2020.  Biopsy obtained at that time which showed melanoma consistent with likely in-transit metastasis.  He underwent repeat surgical excision on August 24, 2019.  The final pathology showed a 2.8 cm neoplasm with positive margins.  The tumor showed spindled to epithelioid melanocyte with areas of necrosis and nuclear atypia and frequent mitotic figures.      He considered radiation therapy for a total of 48 Gray in 20 fractions to the right cheek but opted against it.     Current therapy: Active surveillance.     Interim History: Gregory [name] returns today for a follow-up evaluation.  Since the last visit,     Medications: Updated on review. Current Outpatient Medications  Medication Sig Dispense Refill   acetaminophen (TYLENOL) 325 MG tablet Take 650 mg by mouth every 4 (four) hours as needed (general discomfort).      albuterol (PROVENTIL HFA;VENTOLIN HFA) 108 (90 Base) MCG/ACT inhaler Inhale 2 puffs into the lungs every 6 (six) hours as needed for wheezing or shortness of breath.     alum & mag hydroxide-simeth (MAALOX PLUS) 400-400-40 MG/5ML suspension Take by mouth.     amLODipine (NORVASC) 10 MG tablet Take 10 mg by mouth daily. for HTN     buPROPion (WELLBUTRIN XL) 300 MG 24 hr tablet Take 300 mg by mouth at bedtime.     cetirizine (ZYRTEC) 10 MG tablet Take 10 mg by mouth daily.     Cholecalciferol (VITAMIN D) 50 MCG (2000 UT) tablet Take 2,000 Units by mouth daily.     Cholecalciferol  (VITAMIN D-3) 125 MCG (5000 UT) TABS Take 1 tablet by mouth daily.     Cholecalciferol (VITAMIN D-3) 5000 units TABS Take 5,000 Units by mouth daily.     diphenhydrAMINE (SOMINEX) 25 MG tablet Take by mouth.     docusate sodium (COLACE) 100 MG capsule Take 1 capsule (100 mg total) by mouth 2 (two) times daily. 10 capsule 0   eszopiclone (LUNESTA) 1 MG TABS tablet Take 1 mg by mouth at bedtime. Take immediately before bedtime     Eszopiclone 3 MG TABS Take 3 mg by mouth at bedtime.     ferrous sulfate 325 (65 FE) MG tablet Take 1 tablet (325 mg total) by mouth daily with breakfast. 30 tablet 0   finasteride (PROSCAR) 5 MG tablet Take 5 mg by mouth daily.     furosemide (LASIX) 40 MG tablet Take by mouth.     glimepiride (AMARYL) 2 MG tablet Take by mouth.     glipiZIDE (GLUCOTROL) 10 MG tablet Take 10 mg by mouth daily before breakfast.     insulin aspart (NOVOLOG) 100 UNIT/ML injection Inject 0-9 Units into the skin 3 (three) times daily with meals. 8.1 mL 0   insulin glargine (LANTUS) 100 UNIT/ML injection Inject into the skin.     insulin lispro (HUMALOG) 100 UNIT/ML KwikPen Inject 16 Units into the skin 3 (three) times daily before  meals.      LYRICA 300 MG capsule Take 150 mg by mouth 2 (two) times daily.     magnesium oxide (MAG-OX) 400 MG tablet Take 400 mg by mouth 2 (two) times daily.     metoprolol tartrate (LOPRESSOR) 25 MG tablet Take 0.5 tablets (12.5 mg total) by mouth 2 (two) times daily. 30 tablet 0   mirabegron ER (MYRBETRIQ) 50 MG TB24 tablet Take 50 mg by mouth daily.     mometasone (NASONEX) 50 MCG/ACT nasal spray Place 2 sprays into the nose at bedtime.      naloxone (NARCAN) 4 MG/0.1ML LIQD nasal spray kit Place into the nose.     omeprazole (PRILOSEC) 20 MG capsule Take 40 mg by mouth daily before breakfast.      ondansetron (ZOFRAN) 4 MG tablet Take by mouth.     oxybutynin (DITROPAN-XL) 10 MG 24 hr tablet Take 10 mg by mouth at bedtime.     oxycodone (OXY-IR) 5 MG  capsule Take 5 mg by mouth every 8 (eight) hours.     oxycodone (ROXICODONE) 30 MG immediate release tablet Take 30 mg by mouth every 8 (eight) hours as needed.     PARoxetine (PAXIL) 10 MG tablet Take 10 mg by mouth at bedtime.      promethazine (PHENERGAN) 25 MG tablet Take by mouth.     rosuvastatin (CRESTOR) 10 MG tablet Take 10 mg by mouth at bedtime.     senna-docusate (SENOKOT-S) 8.6-50 MG tablet Take 2 tablets by mouth at bedtime.     tamsulosin (FLOMAX) 0.4 MG CAPS capsule Take 0.4 mg by mouth every evening.      Tiotropium Bromide Monohydrate 1.25 MCG/ACT AERS Inhale 2 puffs into the lungs daily.     triamcinolone (KENALOG) 0.025 % ointment Apply 1 application topically 2 (two) times daily. 30 g 5   vitamin B-12 (CYANOCOBALAMIN) 500 MCG tablet Take 1,000 mcg by mouth daily.      vitamin C (ASCORBIC ACID) 250 MG tablet Take 500 mg by mouth 2 (two) times daily.     zinc sulfate 220 (50 Zn) MG capsule Take 220 mg by mouth daily.     No current facility-administered medications for this visit.    Physical exam   ECOG 1    General appearance: Alert, awake without any distress. Head: Atraumatic without abnormalities Oropharynx: Without any thrush or ulcers. Eyes: No scleral icterus. Lymph nodes: No lymphadenopathy noted in the cervical, supraclavicular, or axillary nodes Heart:regular rate and rhythm, without any murmurs or gallops.   Lung: Clear to auscultation without any rhonchi, wheezes or dullness to percussion. Abdomin: Soft, nontender without any shifting dullness or ascites. Musculoskeletal: No clubbing or cyanosis. Neurological: No motor or sensory deficits. Skin: No rashes or lesions.     Lab Results: Lab Results  Component Value Date   WBC 7.7 02/24/2020   HGB 10.7 (L) 02/24/2020   HCT 33.8 (L) 02/24/2020   MCV 89.9 02/24/2020   PLT 221 02/24/2020     Chemistry      Component Value Date/Time   NA 137 02/24/2020 0938   NA 138 10/22/2016 0000   K 4.5  02/24/2020 0938   CL 101 02/24/2020 0938   CO2 27 02/24/2020 0938   BUN 26 (H) 02/24/2020 0938   BUN 26 (A) 10/22/2016 0000   CREATININE 1.90 (H) 03/30/2021 1224   CREATININE 1.95 (H) 02/24/2020 0938   CREATININE 2.67 (H) 10/02/2016 1651   GLU 154 10/22/2016 0000  Component Value Date/Time   CALCIUM 9.0 02/24/2020 0938   ALKPHOS 94 02/24/2020 0938   AST 8 (L) 02/24/2020 0938   ALT 8 02/24/2020 0938   BILITOT 0.3 02/24/2020 0938      IMPRESSION: 1. No findings of progressive disease in the neck. 2. Unchanged to minimally decreased size of borderline enlarged cervical chain lymph nodes bilaterally. 3. Chronic partially nodular thickening of the soft tissues of the right cheek and lower eyelid, incompletely imaged on today's study but without clear progression of the included portions. 4. Aortic Atherosclerosis (ICD10-I70.0).     Impression and Plan:  60 year old man with:   1.   Melanoma of the right face diagnosed in 2018 and developed in-transit metastasis in March 2021.   He remains on active surveillance at this time after declining systemic therapy.  CT scan obtained on March 31, 2019 to the neck and the face showed evidence of disease progression.  Risks and benefits of continuing this approach as well as the role for immunotherapy in the setting were discussed.  I recommended continued active surveillance as he declined systemic therapy.     2.  Follow-up: He will return in 6 months for a follow-up visit.      30  minutes were spent on this encounter.  The time was dedicated to reviewing his disease status, treatment choices reviewing imaging studies and future plan of care discussion.   Zola Button, MD 04/13/2021 10:23 AM

## 2021-04-17 ENCOUNTER — Other Ambulatory Visit: Payer: Self-pay

## 2021-04-17 DIAGNOSIS — I739 Peripheral vascular disease, unspecified: Secondary | ICD-10-CM

## 2021-04-20 ENCOUNTER — Other Ambulatory Visit: Payer: Self-pay

## 2021-04-20 ENCOUNTER — Ambulatory Visit (HOSPITAL_COMMUNITY)
Admission: RE | Admit: 2021-04-20 | Discharge: 2021-04-20 | Disposition: A | Discharge status: Home / Self Care | Payer: Medicaid Other | Source: Ambulatory Visit | Attending: Internal Medicine | Admitting: Internal Medicine

## 2021-04-20 ENCOUNTER — Encounter: Payer: Medicaid Other | Admitting: Vascular Surgery

## 2021-04-20 DIAGNOSIS — I739 Peripheral vascular disease, unspecified: Secondary | ICD-10-CM | POA: Diagnosis present

## 2021-04-27 ENCOUNTER — Telehealth: Payer: Self-pay

## 2021-04-27 ENCOUNTER — Encounter: Payer: Medicaid Other | Admitting: Vascular Surgery

## 2021-04-27 NOTE — Telephone Encounter (Signed)
Patient calls today to ask if he needs to be seen s/p ABIs. Advised him that it was recommended he come to his scheduled new vascular appt for evaluation. He says his daughter is an Therapist, sports and she read the results. Advised patient that he would have to decide if he wanted to be seen or not. He says he has a sore on his butt and was advised to stay off of it. Rescheduled patient for next week per his request.

## 2021-05-04 ENCOUNTER — Other Ambulatory Visit: Payer: Self-pay

## 2021-05-04 ENCOUNTER — Encounter: Payer: Self-pay | Admitting: Vascular Surgery

## 2021-05-04 ENCOUNTER — Ambulatory Visit (INDEPENDENT_AMBULATORY_CARE_PROVIDER_SITE_OTHER): Payer: Medicaid Other | Admitting: Vascular Surgery

## 2021-05-04 VITALS — BP 151/76 | HR 68 | Temp 98.7°F | Resp 20 | Ht 72.0 in | Wt 235.0 lb

## 2021-05-04 DIAGNOSIS — I890 Lymphedema, not elsewhere classified: Secondary | ICD-10-CM | POA: Diagnosis not present

## 2021-05-04 DIAGNOSIS — I872 Venous insufficiency (chronic) (peripheral): Secondary | ICD-10-CM | POA: Diagnosis not present

## 2021-05-04 NOTE — Progress Notes (Signed)
ASSESSMENT & PLAN   COMBINED CHRONIC VENOUS INSUFFICIENCY AND LYMPHEDEMA: Based on his exam I think this patient has combined chronic venous insufficiency and lymphedema.  He has massive swelling of the left lower extremity now with a wound.  He has advanced skin changes with hyperplasia, hyperkeratosis, and papillomatosis.  We have discussed the importance of intermittent leg elevation and the proper positioning for this.  He states that he has a hard time lying flat but will work on trying to get his leg above his heart.  I encouraged him to exercise however his activity is very limited as he has an above-the-knee amputation on the right and does not have a prosthesis.  He is willing to try compression therapy again and I think we will start with a thigh-high 15-20 stocking which I have written for.  For now I think the most important thing is leg elevation.  I will see him back in 4 to 6 weeks.  If things have not improved significantly I think he would be a candidate for a lymphedema pump.  He would require the BioTAB full leg length for the left leg only.  In addition, when he returns I will obtain formal venous reflux testing on the left.  If he did have significant superficial venous disease he might be a candidate for addressing this.  Based on his noninvasive study he did not have evidence of significant arterial insufficiency however if the wound fails to heal that would be the other consideration would be to obtain arteriography given that he has calcific disease likely and his ABIs may be falsely elevated.  He did have biphasic flow in the left foot however.  TOBACCO ABUSE: We have discussed the importance of tobacco cessation.  We have explained that the nicotine can cause significant vasoconstriction which interferes with wound healing. (3 min)  REASON FOR CONSULT:    Nonhealing wound left lower extremity with peripheral arterial disease.  The consult is requested by Dr. Elijio Miles  HPI:    Gregory [name] is a 60 y.o. male who presents for evaluation of a wound on the left leg.  This patient has a right above-the-knee amputation.  He has a massively swollen left lower extremity with what looks like combined chronic venous insufficiency and lymphedema.  He developed a wound below the knee which had been draining and he states has now improved significantly.  Of note he did undergo noninvasive studies on 04/20/2021 which did not show any evidence of significant arterial insufficiency.  He denies any previous history of DVT.  He tells me that he does not really tolerate compression therapy but is willing to try it again.  He does not have a prosthesis for his right above-the-knee amputation.   His risk factors for peripheral vascular disease include type 2 diabetes, hypertension, hyperlipidemia, and tobacco use.  He has cut back to half a pack per day but had smoked up to 3 packs/day in the past.  I have reviewed the records from the referring offices.  The patient was seen by Dr. Meridee Score on 03/27/2021.  At that time he was at a skilled nursing facility.  He has a right above-the-knee amputation.  Past Medical History:  Diagnosis Date   Acquired absence of left great toe ([place])    Acquired absence of right leg below knee ([place])    Acute osteomyelitis of calcaneum, right ([place]) 10/02/2016   Acute respiratory failure ([place])    Amputation stump infection ([place]) 08/12/2018  Anemia    Basal cell carcinoma, eyelid    Cancer (HCC)    Candidiasis 08/12/2018   CHF (congestive heart failure) (HCC)    chronic diastolic    Chronic back pain    Chronic kidney disease    stage 3    Chronic multifocal osteomyelitis (HCC)    of ankle and foot    Chronic pain syndrome    COPD (chronic obstructive pulmonary disease) (HCC)    DDD (degenerative disc disease), lumbar    Depression    major depressive disorder    Diabetes mellitus without complication ([place])    type 2    Diabetic ulcer of toe of  left foot ([place]) 10/02/2016   Difficulty in walking, not elsewhere classified    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   Foot drop, bilateral    Foot drop, right 12/27/2015   GERD (gastroesophageal reflux disease)    Herpesviral vesicular dermatitis    Hyperlipidemia    Hyperlipidemia    Hypertension    Insomnia    Malignant melanoma of other parts of face (HCC)    MRSA bacteremia    Muscle weakness (generalized)    Nasal congestion    Necrosis of toe (HCC) 07/08/2018   Neuromuscular dysfunction of bladder    Osteomyelitis (HCC)    Osteomyelitis (HCC)    Osteomyelitis (HCC)    right BKA   Other idiopathic peripheral autonomic neuropathy    Retention of urine, unspecified    Squamous cell carcinoma of skin    Unsteadiness on feet    Urinary retention    Urinary retention    Urinary tract infection    Wears dentures    Wears glasses     Family History  Problem Relation Age of Onset   Diabetes Mother    Heart disease Mother    Cancer Mother    Cancer Father    Bone cancer Father    Prostate cancer Father    Cancer Paternal Grandfather    Cancer Other    Diabetes Other     SOCIAL HISTORY: Social History   Tobacco Use   Smoking status: Every Day    Packs/day: 0.50    Types: Cigarettes   Smokeless tobacco: Never   Tobacco comments:    11/19/18  Substance Use Topics   Alcohol use: No    No Known Allergies  Current Outpatient Medications  Medication Sig Dispense Refill   acetaminophen (TYLENOL) 325 MG tablet Take 650 mg by mouth every 4 (four) hours as needed (general discomfort).      albuterol (PROVENTIL HFA;VENTOLIN HFA) 108 (90 Base) MCG/ACT inhaler Inhale 2 puffs into the lungs every 6 (six) hours as needed for wheezing or shortness of breath.     alum & mag hydroxide-simeth (MAALOX PLUS) 400-400-40 MG/5ML suspension Take by mouth.     amLODipine (NORVASC) 10 MG tablet Take 10 mg by mouth daily. for HTN     buPROPion (WELLBUTRIN XL) 300 MG 24 hr tablet Take 300 mg by  mouth at bedtime.     cetirizine (ZYRTEC) 10 MG tablet Take 10 mg by mouth daily.     Cholecalciferol (VITAMIN D) 50 MCG (2000 UT) tablet Take 2,000 Units by mouth daily.     Cholecalciferol (VITAMIN D-3) 125 MCG (5000 UT) TABS Take 1 tablet by mouth daily.     Cholecalciferol (VITAMIN D-3) 5000 units TABS Take 5,000 Units by mouth daily.     diphenhydrAMINE (SOMINEX) 25 MG tablet Take by mouth.  docusate sodium (COLACE) 100 MG capsule Take 1 capsule (100 mg total) by mouth 2 (two) times daily. 10 capsule 0   eszopiclone (LUNESTA) 1 MG TABS tablet Take 1 mg by mouth at bedtime. Take immediately before bedtime     Eszopiclone 3 MG TABS Take 3 mg by mouth at bedtime.     ferrous sulfate 325 (65 FE) MG tablet Take 1 tablet (325 mg total) by mouth daily with breakfast. 30 tablet 0   finasteride (PROSCAR) 5 MG tablet Take 5 mg by mouth daily.     furosemide (LASIX) 40 MG tablet Take by mouth.     glimepiride (AMARYL) 2 MG tablet Take by mouth.     glipiZIDE (GLUCOTROL) 10 MG tablet Take 10 mg by mouth daily before breakfast.     insulin glargine (LANTUS) 100 UNIT/ML injection Inject into the skin.     insulin lispro (HUMALOG) 100 UNIT/ML KwikPen Inject 16 Units into the skin 3 (three) times daily before meals.      LYRICA 300 MG capsule Take 150 mg by mouth 2 (two) times daily.     magnesium oxide (MAG-OX) 400 MG tablet Take 400 mg by mouth 2 (two) times daily.     mirabegron ER (MYRBETRIQ) 50 MG TB24 tablet Take 50 mg by mouth daily.     mometasone (NASONEX) 50 MCG/ACT nasal spray Place 2 sprays into the nose at bedtime.      naloxone (NARCAN) 4 MG/0.1ML LIQD nasal spray kit Place into the nose.     omeprazole (PRILOSEC) 20 MG capsule Take 40 mg by mouth daily before breakfast.      ondansetron (ZOFRAN) 4 MG tablet Take by mouth.     oxybutynin (DITROPAN-XL) 10 MG 24 hr tablet Take 10 mg by mouth at bedtime.     oxycodone (OXY-IR) 5 MG capsule Take 5 mg by mouth every 8 (eight) hours.      oxycodone (ROXICODONE) 30 MG immediate release tablet Take 30 mg by mouth every 8 (eight) hours as needed.     PARoxetine (PAXIL) 10 MG tablet Take 10 mg by mouth at bedtime.      promethazine (PHENERGAN) 25 MG tablet Take by mouth.     rosuvastatin (CRESTOR) 10 MG tablet Take 10 mg by mouth at bedtime.     senna-docusate (SENOKOT-S) 8.6-50 MG tablet Take 2 tablets by mouth at bedtime.     tamsulosin (FLOMAX) 0.4 MG CAPS capsule Take 0.4 mg by mouth every evening.      Tiotropium Bromide Monohydrate 1.25 MCG/ACT AERS Inhale 2 puffs into the lungs daily.     triamcinolone (KENALOG) 0.025 % ointment Apply 1 application topically 2 (two) times daily. 30 g 5   vitamin B-12 (CYANOCOBALAMIN) 500 MCG tablet Take 1,000 mcg by mouth daily.      vitamin C (ASCORBIC ACID) 250 MG tablet Take 500 mg by mouth 2 (two) times daily.     zinc sulfate 220 (50 Zn) MG capsule Take 220 mg by mouth daily.     insulin aspart (NOVOLOG) 100 UNIT/ML injection Inject 0-9 Units into the skin 3 (three) times daily with meals. 8.1 mL 0   metoprolol tartrate (LOPRESSOR) 25 MG tablet Take 0.5 tablets (12.5 mg total) by mouth 2 (two) times daily. 30 tablet 0   No current facility-administered medications for this visit.    REVIEW OF SYSTEMS:  [X] denotes positive finding, [ ] denotes negative finding Cardiac  Comments:  Chest pain or chest pressure:  Shortness of breath upon exertion:    Short of breath when lying flat:    Irregular heart rhythm:        Vascular    Pain in calf, thigh, or hip brought on by ambulation:    Pain in feet at night that wakes you up from your sleep:     Blood clot in your veins:    Leg swelling:  x       Pulmonary    Oxygen at home:    Productive cough:     Wheezing:         Neurologic    Sudden weakness in arms or legs:     Sudden numbness in arms or legs:     Sudden onset of difficulty speaking or slurred speech:    Temporary loss of vision in one eye:     Problems with  dizziness:         Gastrointestinal    Blood in stool:     Vomited blood:         Genitourinary    Burning when urinating:     Blood in urine:        Psychiatric    Major depression:         Hematologic    Bleeding problems:    Problems with blood clotting too easily:        Skin    Rashes or ulcers: x       Constitutional    Fever or chills:    -  PHYSICAL EXAM:   Vitals:   05/04/21 0814  BP: (!) 151/76  Pulse: 68  Resp: 20  Temp: 98.7 F (37.1 C)  SpO2: 90%  Weight: 235 lb (106.6 kg)  Height: 6' (1.829 m)   Body mass index is 31.87 kg/m. GENERAL: The patient is a well-nourished male, in no acute distress. The vital signs are documented above. CARDIAC: There is a regular rate and rhythm.  VASCULAR: I do not detect carotid bruits. Given the massive swelling in the left leg, it is impossible to assess his pulses. He has hyperkeratosis, papillomatosis, and hyperplasia as documented in the photograph below.  He also has some hyperpigmentation.   PULMONARY: There is good air exchange bilaterally without wheezing or rales. ABDOMEN: Soft and non-tender with normal pitched bowel sounds.  MUSCULOSKELETAL: There are no major deformities. NEUROLOGIC: No focal weakness or paresthesias are detected. SKIN: There are no ulcers or rashes noted. PSYCHIATRIC: The patient has a normal affect.  DATA:    ARTERIAL DOPPLER STUDY: I have independently interpreted the arterial Doppler study that was done on 04/20/2021.  This was of the left lower extremity only as the patient has a right above-the-knee amputation.  He has a biphasic posterior tibial signal and dorsalis pedis signal on the left.  ABI is 97% although this could be falsely elevated because of calcific disease.  A toe pressure could not be obtained as he has a transmetatarsal amputation on the left.  Deitra Mayo [facility]

## 2021-05-08 ENCOUNTER — Other Ambulatory Visit: Payer: Self-pay

## 2021-05-08 DIAGNOSIS — I872 Venous insufficiency (chronic) (peripheral): Secondary | ICD-10-CM

## 2021-06-07 ENCOUNTER — Ambulatory Visit: Payer: Medicaid Other | Admitting: Vascular Surgery

## 2021-06-07 ENCOUNTER — Encounter (HOSPITAL_COMMUNITY): Payer: Medicaid Other

## 2021-06-08 ENCOUNTER — Ambulatory Visit: Payer: Medicaid Other | Admitting: Vascular Surgery

## 2021-06-08 ENCOUNTER — Ambulatory Visit (HOSPITAL_COMMUNITY): Admission: RE | Admit: 2021-06-08 | Payer: Medicaid Other | Source: Ambulatory Visit

## 2021-06-26 ENCOUNTER — Emergency Department (HOSPITAL_COMMUNITY): Payer: Medicaid Other

## 2021-06-26 ENCOUNTER — Other Ambulatory Visit: Payer: Self-pay

## 2021-06-26 ENCOUNTER — Inpatient Hospital Stay (HOSPITAL_COMMUNITY)
Admission: EM | Admit: 2021-06-26 | Discharge: 2021-07-07 | DRG: 177 | Disposition: A | Discharge status: Skilled Nursing Facility | Payer: Medicaid Other | Attending: Internal Medicine | Admitting: Internal Medicine

## 2021-06-26 ENCOUNTER — Inpatient Hospital Stay (HOSPITAL_COMMUNITY): Payer: Medicaid Other

## 2021-06-26 DIAGNOSIS — L89153 Pressure ulcer of sacral region, stage 3: Secondary | ICD-10-CM | POA: Diagnosis present

## 2021-06-26 DIAGNOSIS — Z794 Long term (current) use of insulin: Secondary | ICD-10-CM | POA: Diagnosis not present

## 2021-06-26 DIAGNOSIS — Z79899 Other long term (current) drug therapy: Secondary | ICD-10-CM

## 2021-06-26 DIAGNOSIS — K6389 Other specified diseases of intestine: Secondary | ICD-10-CM

## 2021-06-26 DIAGNOSIS — R7989 Other specified abnormal findings of blood chemistry: Secondary | ICD-10-CM | POA: Diagnosis not present

## 2021-06-26 DIAGNOSIS — Z20822 Contact with and (suspected) exposure to covid-19: Secondary | ICD-10-CM | POA: Diagnosis present

## 2021-06-26 DIAGNOSIS — Z79891 Long term (current) use of opiate analgesic: Secondary | ICD-10-CM

## 2021-06-26 DIAGNOSIS — T40601A Poisoning by unspecified narcotics, accidental (unintentional), initial encounter: Secondary | ICD-10-CM

## 2021-06-26 DIAGNOSIS — Z8042 Family history of malignant neoplasm of prostate: Secondary | ICD-10-CM | POA: Diagnosis not present

## 2021-06-26 DIAGNOSIS — K219 Gastro-esophageal reflux disease without esophagitis: Secondary | ICD-10-CM | POA: Diagnosis present

## 2021-06-26 DIAGNOSIS — J189 Pneumonia, unspecified organism: Principal | ICD-10-CM

## 2021-06-26 DIAGNOSIS — L8993 Pressure ulcer of unspecified site, stage 3: Secondary | ICD-10-CM | POA: Diagnosis present

## 2021-06-26 DIAGNOSIS — J9601 Acute respiratory failure with hypoxia: Secondary | ICD-10-CM

## 2021-06-26 DIAGNOSIS — R112 Nausea with vomiting, unspecified: Secondary | ICD-10-CM

## 2021-06-26 DIAGNOSIS — N1832 Chronic kidney disease, stage 3b: Secondary | ICD-10-CM | POA: Diagnosis present

## 2021-06-26 DIAGNOSIS — E1165 Type 2 diabetes mellitus with hyperglycemia: Secondary | ICD-10-CM | POA: Diagnosis not present

## 2021-06-26 DIAGNOSIS — Z833 Family history of diabetes mellitus: Secondary | ICD-10-CM

## 2021-06-26 DIAGNOSIS — J690 Pneumonitis due to inhalation of food and vomit: Principal | ICD-10-CM | POA: Diagnosis present

## 2021-06-26 DIAGNOSIS — I248 Other forms of acute ischemic heart disease: Secondary | ICD-10-CM | POA: Diagnosis not present

## 2021-06-26 DIAGNOSIS — I130 Hypertensive heart and chronic kidney disease with heart failure and stage 1 through stage 4 chronic kidney disease, or unspecified chronic kidney disease: Secondary | ICD-10-CM | POA: Diagnosis present

## 2021-06-26 DIAGNOSIS — F419 Anxiety disorder, unspecified: Secondary | ICD-10-CM | POA: Diagnosis present

## 2021-06-26 DIAGNOSIS — E1149 Type 2 diabetes mellitus with other diabetic neurological complication: Secondary | ICD-10-CM | POA: Diagnosis present

## 2021-06-26 DIAGNOSIS — E1122 Type 2 diabetes mellitus with diabetic chronic kidney disease: Secondary | ICD-10-CM | POA: Diagnosis present

## 2021-06-26 DIAGNOSIS — I890 Lymphedema, not elsewhere classified: Secondary | ICD-10-CM

## 2021-06-26 DIAGNOSIS — K599 Functional intestinal disorder, unspecified: Secondary | ICD-10-CM | POA: Diagnosis not present

## 2021-06-26 DIAGNOSIS — I10 Essential (primary) hypertension: Secondary | ICD-10-CM | POA: Diagnosis present

## 2021-06-26 DIAGNOSIS — E669 Obesity, unspecified: Secondary | ICD-10-CM | POA: Diagnosis present

## 2021-06-26 DIAGNOSIS — J449 Chronic obstructive pulmonary disease, unspecified: Secondary | ICD-10-CM | POA: Diagnosis present

## 2021-06-26 DIAGNOSIS — Z6833 Body mass index (BMI) 33.0-33.9, adult: Secondary | ICD-10-CM

## 2021-06-26 DIAGNOSIS — D631 Anemia in chronic kidney disease: Secondary | ICD-10-CM | POA: Diagnosis present

## 2021-06-26 DIAGNOSIS — R8271 Bacteriuria: Secondary | ICD-10-CM | POA: Diagnosis present

## 2021-06-26 DIAGNOSIS — Z7984 Long term (current) use of oral hypoglycemic drugs: Secondary | ICD-10-CM | POA: Diagnosis not present

## 2021-06-26 DIAGNOSIS — R0902 Hypoxemia: Secondary | ICD-10-CM

## 2021-06-26 DIAGNOSIS — G894 Chronic pain syndrome: Secondary | ICD-10-CM | POA: Diagnosis present

## 2021-06-26 DIAGNOSIS — L03116 Cellulitis of left lower limb: Secondary | ICD-10-CM | POA: Diagnosis present

## 2021-06-26 DIAGNOSIS — Z8249 Family history of ischemic heart disease and other diseases of the circulatory system: Secondary | ICD-10-CM

## 2021-06-26 DIAGNOSIS — F17210 Nicotine dependence, cigarettes, uncomplicated: Secondary | ICD-10-CM | POA: Diagnosis present

## 2021-06-26 DIAGNOSIS — T40605A Adverse effect of unspecified narcotics, initial encounter: Secondary | ICD-10-CM | POA: Diagnosis not present

## 2021-06-26 DIAGNOSIS — G8929 Other chronic pain: Secondary | ICD-10-CM | POA: Diagnosis present

## 2021-06-26 DIAGNOSIS — N183 Chronic kidney disease, stage 3 unspecified: Secondary | ICD-10-CM | POA: Diagnosis present

## 2021-06-26 DIAGNOSIS — Z720 Tobacco use: Secondary | ICD-10-CM | POA: Diagnosis present

## 2021-06-26 DIAGNOSIS — I5032 Chronic diastolic (congestive) heart failure: Secondary | ICD-10-CM | POA: Diagnosis present

## 2021-06-26 DIAGNOSIS — E785 Hyperlipidemia, unspecified: Secondary | ICD-10-CM | POA: Diagnosis present

## 2021-06-26 DIAGNOSIS — Z7951 Long term (current) use of inhaled steroids: Secondary | ICD-10-CM | POA: Diagnosis not present

## 2021-06-26 DIAGNOSIS — Z515 Encounter for palliative care: Secondary | ICD-10-CM | POA: Diagnosis not present

## 2021-06-26 DIAGNOSIS — R0602 Shortness of breath: Secondary | ICD-10-CM | POA: Diagnosis present

## 2021-06-26 DIAGNOSIS — R110 Nausea: Secondary | ICD-10-CM

## 2021-06-26 DIAGNOSIS — Z89611 Acquired absence of right leg above knee: Secondary | ICD-10-CM

## 2021-06-26 DIAGNOSIS — D649 Anemia, unspecified: Secondary | ICD-10-CM | POA: Diagnosis present

## 2021-06-26 LAB — COMPREHENSIVE METABOLIC PANEL
ALT: 10 U/L (ref 0–44)
AST: 12 U/L — ABNORMAL LOW (ref 15–41)
Albumin: 3 g/dL — ABNORMAL LOW (ref 3.5–5.0)
Alkaline Phosphatase: 84 U/L (ref 38–126)
Anion gap: 8 (ref 5–15)
BUN: 33 mg/dL — ABNORMAL HIGH (ref 8–23)
CO2: 25 mmol/L (ref 22–32)
Calcium: 8.3 mg/dL — ABNORMAL LOW (ref 8.9–10.3)
Chloride: 106 mmol/L (ref 98–111)
Creatinine, Ser: 2.28 mg/dL — ABNORMAL HIGH (ref 0.61–1.24)
GFR, Estimated: 32 mL/min — ABNORMAL LOW (ref >60)
Glucose, Bld: 195 mg/dL — ABNORMAL HIGH (ref 70–99)
Potassium: 4.7 mmol/L (ref 3.5–5.1)
Sodium: 139 mmol/L (ref 135–145)
Total Bilirubin: 0.7 mg/dL (ref 0.3–1.2)
Total Protein: 7.2 g/dL (ref 6.5–8.1)

## 2021-06-26 LAB — URINALYSIS, ROUTINE W REFLEX MICROSCOPIC
Bilirubin Urine: NEGATIVE
Glucose, UA: NEGATIVE mg/dL
Ketones, ur: NEGATIVE mg/dL
Nitrite: POSITIVE — AB
Protein, ur: 100 mg/dL — AB
Specific Gravity, Urine: 1.015 (ref 1.005–1.030)
pH: 6 (ref 5.0–8.0)

## 2021-06-26 LAB — RESPIRATORY PANEL BY PCR

## 2021-06-26 LAB — I-STAT ARTERIAL BLOOD GAS, ED
Acid-Base Excess: 0 mmol/L (ref 0.0–2.0)
Bicarbonate: 24.8 mmol/L (ref 20.0–28.0)
Calcium, Ion: 1.19 mmol/L (ref 1.15–1.40)
HCT: 27 % — ABNORMAL LOW (ref 39.0–52.0)
Hemoglobin: 9.2 g/dL — ABNORMAL LOW (ref 13.0–17.0)
O2 Saturation: 98 %
Patient temperature: 99
Potassium: 4.6 mmol/L (ref 3.5–5.1)
Sodium: 138 mmol/L (ref 135–145)
TCO2: 26 mmol/L (ref 22–32)
pCO2 arterial: 41.8 mmHg (ref 32.0–48.0)
pH, Arterial: 7.381 (ref 7.350–7.450)
pO2, Arterial: 106 mmHg (ref 83.0–108.0)

## 2021-06-26 LAB — MRSA NEXT GEN BY PCR, NASAL: MRSA by PCR Next Gen: DETECTED — AB

## 2021-06-26 LAB — CBC WITH DIFFERENTIAL/PLATELET
Abs Immature Granulocytes: 0.07 10*3/uL (ref 0.00–0.07)
Basophils Absolute: 0.1 10*3/uL (ref 0.0–0.1)
Basophils Relative: 0 %
Eosinophils Absolute: 0.1 10*3/uL (ref 0.0–0.5)
Eosinophils Relative: 1 %
HCT: 28.8 % — ABNORMAL LOW (ref 39.0–52.0)
Hemoglobin: 8.8 g/dL — ABNORMAL LOW (ref 13.0–17.0)
Immature Granulocytes: 1 %
Lymphocytes Relative: 7 %
Lymphs Abs: 0.9 10*3/uL (ref 0.7–4.0)
MCH: 28.4 pg (ref 26.0–34.0)
MCHC: 30.6 g/dL (ref 30.0–36.0)
MCV: 92.9 fL (ref 80.0–100.0)
Monocytes Absolute: 1.1 10*3/uL — ABNORMAL HIGH (ref 0.1–1.0)
Monocytes Relative: 8 %
Neutro Abs: 11.2 10*3/uL — ABNORMAL HIGH (ref 1.7–7.7)
Neutrophils Relative %: 83 %
Platelets: 240 10*3/uL (ref 150–400)
RBC: 3.1 MIL/uL — ABNORMAL LOW (ref 4.22–5.81)
RDW: 16.2 % — ABNORMAL HIGH (ref 11.5–15.5)
WBC: 13.4 10*3/uL — ABNORMAL HIGH (ref 4.0–10.5)
nRBC: 0 % (ref 0.0–0.2)

## 2021-06-26 LAB — ECHOCARDIOGRAM COMPLETE
AR max vel: 2.85 cm2
AV Peak grad: 15.1 mmHg
Ao pk vel: 1.95 m/s
Area-P 1/2: 4.49 cm2
Height: 72 in
P 1/2 time: 476 msec
S' Lateral: 3.73 cm
Weight: 3947.12 oz

## 2021-06-26 LAB — RESP PANEL BY RT-PCR (FLU A&B, COVID) ARPGX2
Influenza A by PCR: NEGATIVE
Influenza B by PCR: NEGATIVE
SARS Coronavirus 2 by RT PCR: NEGATIVE

## 2021-06-26 LAB — URINALYSIS, MICROSCOPIC (REFLEX): Squamous Epithelial / HPF: NONE SEEN (ref 0–5)

## 2021-06-26 LAB — TROPONIN I (HIGH SENSITIVITY)
Troponin I (High Sensitivity): 24 ng/L — ABNORMAL HIGH (ref <18)
Troponin I (High Sensitivity): 31 ng/L — ABNORMAL HIGH (ref <18)

## 2021-06-26 LAB — HEMOGLOBIN A1C
Hgb A1c MFr Bld: 7.2 % — ABNORMAL HIGH (ref 4.8–5.6)
Mean Plasma Glucose: 159.94 mg/dL

## 2021-06-26 LAB — GLUCOSE, CAPILLARY
Glucose-Capillary: 145 mg/dL — ABNORMAL HIGH (ref 70–99)
Glucose-Capillary: 110 mg/dL — ABNORMAL HIGH (ref 70–99)
Glucose-Capillary: 174 mg/dL — ABNORMAL HIGH (ref 70–99)
Glucose-Capillary: 178 mg/dL — ABNORMAL HIGH (ref 70–99)
Glucose-Capillary: 152 mg/dL — ABNORMAL HIGH (ref 70–99)

## 2021-06-26 LAB — CBG MONITORING, ED: Glucose-Capillary: 185 mg/dL — ABNORMAL HIGH (ref 70–99)

## 2021-06-26 LAB — HIV ANTIBODY (ROUTINE TESTING W REFLEX): HIV Screen 4th Generation wRfx: NONREACTIVE

## 2021-06-26 LAB — LACTIC ACID, PLASMA: Lactic Acid, Venous: 1 mmol/L (ref 0.5–1.9)

## 2021-06-26 LAB — PROCALCITONIN: Procalcitonin: 0.91 ng/mL

## 2021-06-26 LAB — STREP PNEUMONIAE URINARY ANTIGEN: Strep Pneumo Urinary Antigen: NEGATIVE

## 2021-06-26 LAB — BRAIN NATRIURETIC PEPTIDE: B Natriuretic Peptide: 182.7 pg/mL — ABNORMAL HIGH (ref 0.0–100.0)

## 2021-06-26 MED ORDER — RENA-VITE PO TABS
1.0000 | ORAL_TABLET | Freq: Every day | ORAL | Status: DC
  Administered 2021-06-26 – 2021-07-06 (×11): 1 via ORAL
  Filled 2021-06-26 (×11): qty 1

## 2021-06-26 MED ORDER — MUPIROCIN 2 % EX OINT
1.0000 "application " | TOPICAL_OINTMENT | Freq: Two times a day (BID) | CUTANEOUS | Status: AC
  Administered 2021-06-26 – 2021-06-30 (×10): 1 via NASAL
  Filled 2021-06-26 (×4): qty 22

## 2021-06-26 MED ORDER — NICOTINE 21 MG/24HR TD PT24
21.0000 mg | MEDICATED_PATCH | Freq: Every day | TRANSDERMAL | Status: DC
  Administered 2021-06-26 – 2021-07-07 (×12): 21 mg via TRANSDERMAL
  Filled 2021-06-26 (×13): qty 1

## 2021-06-26 MED ORDER — DEXMEDETOMIDINE HCL IN NACL 400 MCG/100ML IV SOLN
0.4000 ug/kg/h | INTRAVENOUS | Status: DC
  Administered 2021-06-26 (×2): 0.4 ug/kg/h via INTRAVENOUS
  Filled 2021-06-26 (×3): qty 100

## 2021-06-26 MED ORDER — ALPRAZOLAM 0.5 MG PO TABS
0.2500 mg | ORAL_TABLET | Freq: Two times a day (BID) | ORAL | Status: DC | PRN
  Administered 2021-06-26 – 2021-07-07 (×18): 0.25 mg via ORAL
  Filled 2021-06-26 (×19): qty 1

## 2021-06-26 MED ORDER — DULOXETINE HCL 60 MG PO CPEP
60.0000 mg | ORAL_CAPSULE | Freq: Every day | ORAL | Status: DC
  Administered 2021-06-27 – 2021-07-07 (×11): 60 mg via ORAL
  Filled 2021-06-26 (×12): qty 1

## 2021-06-26 MED ORDER — VANCOMYCIN HCL 2000 MG/400ML IV SOLN
2000.0000 mg | Freq: Once | INTRAVENOUS | Status: AC
  Administered 2021-06-26: 2000 mg via INTRAVENOUS
  Filled 2021-06-26 (×2): qty 400

## 2021-06-26 MED ORDER — ACETAMINOPHEN 325 MG PO TABS
650.0000 mg | ORAL_TABLET | Freq: Four times a day (QID) | ORAL | Status: DC | PRN
  Administered 2021-06-26 – 2021-07-07 (×9): 650 mg via ORAL
  Filled 2021-06-26 (×10): qty 2

## 2021-06-26 MED ORDER — ALBUTEROL SULFATE (2.5 MG/3ML) 0.083% IN NEBU: 2.5000 mg | INHALATION_SOLUTION | RESPIRATORY_TRACT | Status: DC | PRN

## 2021-06-26 MED ORDER — NON FORMULARY: 3.0000 mg | Freq: Every evening | Status: DC | PRN

## 2021-06-26 MED ORDER — SODIUM CHLORIDE 0.9 % IV SOLN
500.0000 mg | Freq: Once | INTRAVENOUS | Status: AC
  Administered 2021-06-26: 500 mg via INTRAVENOUS
  Filled 2021-06-26: qty 5

## 2021-06-26 MED ORDER — BUPROPION HCL ER (XL) 150 MG PO TB24
300.0000 mg | ORAL_TABLET | Freq: Every day | ORAL | Status: DC
  Administered 2021-06-26 – 2021-07-07 (×12): 300 mg via ORAL
  Filled 2021-06-26: qty 2
  Filled 2021-06-26: qty 1
  Filled 2021-06-26 (×3): qty 2
  Filled 2021-06-26: qty 1
  Filled 2021-06-26 (×4): qty 2
  Filled 2021-06-26: qty 1
  Filled 2021-06-26: qty 2

## 2021-06-26 MED ORDER — PANTOPRAZOLE SODIUM 40 MG IV SOLR: 40.0000 mg | Freq: Every day | INTRAVENOUS | Status: DC

## 2021-06-26 MED ORDER — TAMSULOSIN HCL 0.4 MG PO CAPS
0.4000 mg | ORAL_CAPSULE | Freq: Every evening | ORAL | Status: DC
  Administered 2021-06-26 – 2021-07-06 (×11): 0.4 mg via ORAL
  Filled 2021-06-26 (×11): qty 1

## 2021-06-26 MED ORDER — PREGABALIN 75 MG PO CAPS
150.0000 mg | ORAL_CAPSULE | Freq: Two times a day (BID) | ORAL | Status: DC
  Administered 2021-06-26 – 2021-07-07 (×23): 150 mg via ORAL
  Filled 2021-06-26: qty 3
  Filled 2021-06-26 (×6): qty 2
  Filled 2021-06-26: qty 3
  Filled 2021-06-26: qty 2
  Filled 2021-06-26: qty 3
  Filled 2021-06-26 (×9): qty 2
  Filled 2021-06-26: qty 3
  Filled 2021-06-26 (×2): qty 2
  Filled 2021-06-26: qty 3

## 2021-06-26 MED ORDER — PANTOPRAZOLE SODIUM 40 MG PO TBEC
40.0000 mg | DELAYED_RELEASE_TABLET | Freq: Every day | ORAL | Status: DC
  Administered 2021-06-26 – 2021-07-07 (×12): 40 mg via ORAL
  Filled 2021-06-26 (×12): qty 1

## 2021-06-26 MED ORDER — VANCOMYCIN VARIABLE DOSE PER UNSTABLE RENAL FUNCTION (PHARMACIST DOSING): Status: DC

## 2021-06-26 MED ORDER — ENSURE MAX PROTEIN PO LIQD
11.0000 [oz_av] | Freq: Every day | ORAL | Status: DC
  Administered 2021-06-26 – 2021-07-02 (×6): 11 [oz_av] via ORAL
  Filled 2021-06-26 (×9): qty 330

## 2021-06-26 MED ORDER — SODIUM CHLORIDE 0.9 % IV SOLN
1.0000 g | Freq: Once | INTRAVENOUS | Status: AC
  Administered 2021-06-26: 1 g via INTRAVENOUS
  Filled 2021-06-26: qty 10

## 2021-06-26 MED ORDER — OXYCODONE HCL 5 MG PO TABS
20.0000 mg | ORAL_TABLET | Freq: Four times a day (QID) | ORAL | Status: DC | PRN
  Administered 2021-06-26 – 2021-06-28 (×7): 20 mg via ORAL
  Filled 2021-06-26 (×7): qty 4

## 2021-06-26 MED ORDER — PROSOURCE PLUS PO LIQD
30.0000 mL | Freq: Every day | ORAL | Status: DC
  Administered 2021-06-26 – 2021-07-06 (×8): 30 mL via ORAL
  Filled 2021-06-26 (×10): qty 30

## 2021-06-26 MED ORDER — CHLORHEXIDINE GLUCONATE 0.12 % MT SOLN
15.0000 mL | Freq: Two times a day (BID) | OROMUCOSAL | Status: DC
  Administered 2021-06-26 – 2021-07-06 (×20): 15 mL via OROMUCOSAL
  Filled 2021-06-26 (×22): qty 15

## 2021-06-26 MED ORDER — ORAL CARE MOUTH RINSE
15.0000 mL | Freq: Two times a day (BID) | OROMUCOSAL | Status: DC
  Administered 2021-06-26 – 2021-07-05 (×12): 15 mL via OROMUCOSAL

## 2021-06-26 MED ORDER — OXYMETAZOLINE HCL 0.05 % NA SOLN
1.0000 | Freq: Two times a day (BID) | NASAL | Status: AC | PRN
  Administered 2021-06-26 – 2021-06-27 (×2): 1 via NASAL
  Filled 2021-06-26: qty 30

## 2021-06-26 MED ORDER — BUDESONIDE 0.5 MG/2ML IN SUSP
0.5000 mg | Freq: Two times a day (BID) | RESPIRATORY_TRACT | Status: DC
  Administered 2021-06-26 – 2021-07-07 (×22): 0.5 mg via RESPIRATORY_TRACT
  Filled 2021-06-26 (×25): qty 2

## 2021-06-26 MED ORDER — ESZOPICLONE 1 MG PO TABS
3.0000 mg | ORAL_TABLET | Freq: Every evening | ORAL | Status: DC | PRN
  Administered 2021-06-27 – 2021-07-06 (×11): 3 mg via ORAL
  Filled 2021-06-26 (×11): qty 3

## 2021-06-26 MED ORDER — ONDANSETRON HCL 4 MG/2ML IJ SOLN
4.0000 mg | Freq: Once | INTRAMUSCULAR | Status: AC
  Administered 2021-06-26: 4 mg via INTRAVENOUS
  Filled 2021-06-26: qty 2

## 2021-06-26 MED ORDER — ACETAMINOPHEN 325 MG PO TABS: 650.0000 mg | ORAL_TABLET | Freq: Once | ORAL | Status: DC | PRN

## 2021-06-26 MED ORDER — INSULIN ASPART 100 UNIT/ML IJ SOLN
0.0000 [IU] | INTRAMUSCULAR | Status: DC
  Administered 2021-06-26: 20:00:00 3 [IU] via SUBCUTANEOUS
  Administered 2021-06-26 (×3): 4 [IU] via SUBCUTANEOUS
  Administered 2021-06-26: 3 [IU] via SUBCUTANEOUS
  Administered 2021-06-27: 20:00:00 7 [IU] via SUBCUTANEOUS
  Administered 2021-06-27: 17:00:00 11 [IU] via SUBCUTANEOUS
  Administered 2021-06-27: 12:00:00 4 [IU] via SUBCUTANEOUS
  Administered 2021-06-27: 7 [IU] via SUBCUTANEOUS
  Administered 2021-06-27: 3 [IU] via SUBCUTANEOUS
  Administered 2021-06-28: 7 [IU] via SUBCUTANEOUS
  Administered 2021-06-28: 4 [IU] via SUBCUTANEOUS
  Administered 2021-06-28: 3 [IU] via SUBCUTANEOUS
  Administered 2021-06-28: 7 [IU] via SUBCUTANEOUS
  Administered 2021-06-28 (×2): 4 [IU] via SUBCUTANEOUS
  Administered 2021-06-28: 7 [IU] via SUBCUTANEOUS
  Administered 2021-06-29: 3 [IU] via SUBCUTANEOUS
  Administered 2021-06-29 (×2): 4 [IU] via SUBCUTANEOUS
  Administered 2021-06-29 (×3): 3 [IU] via SUBCUTANEOUS
  Administered 2021-06-30 (×2): 4 [IU] via SUBCUTANEOUS
  Administered 2021-06-30: 3 [IU] via SUBCUTANEOUS
  Administered 2021-06-30: 4 [IU] via SUBCUTANEOUS
  Administered 2021-06-30: 3 [IU] via SUBCUTANEOUS
  Administered 2021-07-01: 4 [IU] via SUBCUTANEOUS
  Administered 2021-07-01: 11 [IU] via SUBCUTANEOUS
  Administered 2021-07-01 (×3): 4 [IU] via SUBCUTANEOUS
  Administered 2021-07-01: 11 [IU] via SUBCUTANEOUS
  Administered 2021-07-01: 4 [IU] via SUBCUTANEOUS
  Administered 2021-07-02: 3 [IU] via SUBCUTANEOUS
  Administered 2021-07-02: 11 [IU] via SUBCUTANEOUS
  Administered 2021-07-02: 4 [IU] via SUBCUTANEOUS
  Administered 2021-07-02: 3 [IU] via SUBCUTANEOUS
  Administered 2021-07-03: 4 [IU] via SUBCUTANEOUS
  Administered 2021-07-03: 7 [IU] via SUBCUTANEOUS
  Administered 2021-07-03: 11 [IU] via SUBCUTANEOUS

## 2021-06-26 MED ORDER — DOCUSATE SODIUM 100 MG PO CAPS: 100.0000 mg | ORAL_CAPSULE | Freq: Two times a day (BID) | ORAL | Status: DC | PRN

## 2021-06-26 MED ORDER — SODIUM CHLORIDE 0.9 % IV SOLN
2.0000 g | Freq: Two times a day (BID) | INTRAVENOUS | Status: DC
  Administered 2021-06-26 – 2021-06-27 (×2): 2 g via INTRAVENOUS
  Filled 2021-06-26 (×2): qty 2

## 2021-06-26 MED ORDER — ARFORMOTEROL TARTRATE 15 MCG/2ML IN NEBU
15.0000 ug | INHALATION_SOLUTION | Freq: Two times a day (BID) | RESPIRATORY_TRACT | Status: DC
  Administered 2021-06-26 – 2021-07-07 (×22): 15 ug via RESPIRATORY_TRACT
  Filled 2021-06-26 (×25): qty 2

## 2021-06-26 MED ORDER — UMECLIDINIUM BROMIDE 62.5 MCG/ACT IN AEPB
1.0000 | INHALATION_SPRAY | Freq: Every day | RESPIRATORY_TRACT | Status: DC
  Filled 2021-06-26: qty 7

## 2021-06-26 MED ORDER — CHLORHEXIDINE GLUCONATE CLOTH 2 % EX PADS
6.0000 | MEDICATED_PAD | Freq: Every day | CUTANEOUS | Status: DC
  Administered 2021-06-26 – 2021-07-03 (×7): 6 via TOPICAL

## 2021-06-26 MED ORDER — HEPARIN SODIUM (PORCINE) 5000 UNIT/ML IJ SOLN
5000.0000 [IU] | Freq: Three times a day (TID) | INTRAMUSCULAR | Status: DC
  Administered 2021-06-26 – 2021-07-07 (×35): 5000 [IU] via SUBCUTANEOUS
  Filled 2021-06-26 (×35): qty 1

## 2021-06-26 MED ORDER — FUROSEMIDE 10 MG/ML IJ SOLN
60.0000 mg | Freq: Once | INTRAMUSCULAR | Status: AC
  Administered 2021-06-26: 60 mg via INTRAVENOUS
  Filled 2021-06-26: qty 6

## 2021-06-26 MED ORDER — SODIUM CHLORIDE 0.9 % IV SOLN
2.0000 g | Freq: Once | INTRAVENOUS | Status: AC
  Administered 2021-06-26: 2 g via INTRAVENOUS
  Filled 2021-06-26: qty 2

## 2021-06-26 MED ORDER — POLYETHYLENE GLYCOL 3350 17 G PO PACK
17.0000 g | PACK | Freq: Every day | ORAL | Status: DC | PRN
  Filled 2021-06-26: qty 1

## 2021-06-26 MED ORDER — PERFLUTREN LIPID MICROSPHERE
1.0000 mL | INTRAVENOUS | Status: AC | PRN
  Administered 2021-06-26: 2 mL via INTRAVENOUS
  Filled 2021-06-26: qty 10

## 2021-06-26 NOTE — Progress Notes (Signed)
Patient is ripping off the NIV mask, and yelling stating that he does not want to wear it. Patient oxygen saturations immediately decline to the 60's w/ a great pleth.  Positive reinforcement was given to the patient to attempt to encourage patient to work with the therapy. Patient is still very adamant about not wanting to wear the BiPAP. Patient is currently on it now but likely will rip it off again. Patient is constantly manipulating the mask causing excessive leaks which leads to ineffective therapy. Informed patient that if BiPAP is intolerable that intubation is very likely due to hypoxia respiratory failure and further clinical deterioration. MD Tegeler is aware and at bedside when conversation was taking place. MD encouraged patient to wear it. Patient was given ice chips from RN per MD at bedside.   Velma Agnes L. Tamala Julian, BS, RRT-ACCS, RCP

## 2021-06-26 NOTE — ED Provider Triage Note (Signed)
Emergency Medicine Provider Triage Evaluation Note  ALAND CHESTNUTT , a 61 y.o. male  was evaluated in triage.  Pt complains of SOB.  EMS called to facility for SOB, sats of 69% on RA upon their arrival.  Does not generally wear home O2.  Started on NRB with improvement.  Recent cough and fever.  Temp 101F with EMS.  No sick contacts or covid exposures known.  Review of Systems  Positive: Cough, fever Negative: vomiting  Physical Exam  BP (!) 141/100 (BP Location: Right Arm)    Pulse 83    Resp 20    SpO2 90%   Gen:   Awake, no distress   Resp:  On NRB, wet cough noted, sats 85-90%% on EMS monitor MSK:   Moves extremities without difficulty  Other:    Medical Decision Making  Medically screening exam initiated at 12:39 AM.  Appropriate orders placed.  Cephus Slater was informed that the remainder of the evaluation will be completed by another provider, this initial triage assessment does not replace that evaluation, and the importance of remaining in the ED until their evaluation is complete.  SOB.  Febrile with EMS, sats low at 90% even with supplemental O2 in triage.  Concern for PNA.  Labs, cultures, covid screen, and CXR.  Added ABG given increasing O2 requirement.   Larene Pickett, [name] 06/26/21 [phone]

## 2021-06-26 NOTE — Progress Notes (Signed)
[facility] Progress Note Patient Name: Gregory [name] DOB: [birth year] MRN: [id number]   Date of Service  06/26/2021  HPI/Events of Note  Patient asking for additional home psychotropic medications be resume, including Zanaflex and Gabapentin, patient is on supplemental oxygen and respiratory status is tenuous.  eICU Interventions  Bedside RN requested to explain to him that additional psychotropic medications will be deferred until the AM (he could not hear me well via camera.)        Kerry Kass Lindamarie Maclachlan 06/26/2021, 9:32 PM

## 2021-06-26 NOTE — ED Notes (Signed)
The pt has pulled his bi-pap off  sats in the 80s  resp therapy contacted  he wants the bi-pap off

## 2021-06-26 NOTE — ED Triage Notes (Signed)
Pt brought in by EMS from facility that was formerly [facility] for fever, cough, and shortness of breath.

## 2021-06-26 NOTE — Progress Notes (Signed)
[facility] Progress Note Patient Name: Gregory [name] DOB: [birth year] MRN: [id number]   Date of Service  06/26/2021  HPI/Events of Note  Pt asking for something for pain, LE wound,  takes Oxycodone 30mg  IR every hours and Oxycodone IR 5mg     Takes PO  eICU Interventions  Will avoid narcotics for now. Risk for intubation.   Tylenol ordering.       Intervention Category Intermediate Interventions: Pain - evaluation and management  Elmer Sow 06/26/2021, 5:52 AM

## 2021-06-26 NOTE — H&P (Signed)
NAME:  Gregory [name], MRN:  [id number], DOB:  [birth year], LOS: 0 ADMISSION DATE:  06/26/2021, CONSULTATION DATE:  06/26/21 REFERRING MD:  Gustavus Messing, CHIEF COMPLAINT:  SOB   History of Present Illness:  History is limited due to patient requiring BiPAP with acute respiratory failure  Gregory [name], is a 61 y.o. male, who presented to the [facility] ED with a chief complaint of SOB, fever, and productive cough  They have a pertinent past medical history of CHF, HTN, HLD, COPD, osteomyelitis, RT AKA, chronic L leg wounds, CKD3, DM2, resident of a facility  Per chart review, patient is a resident of a facility, he was brought to the ED by EMS after two days of productive cough, and a day of fever, chills, and shortness of breath. Upon EMS arrival he was found to by hypoxic with sats in the 60's and was started on an NRB.  ED course was notable for transition from NRB to BiPAP with O2 sats in the 90's. Labs notable for leukocytosis, lactate 1.0, BNP 182, troponin 24. Blood cultures and urine cultures were ordered. He was given a dose of ceftriaxone, azithromycin  PCCM was consulted for admission   Pertinent  Medical History  CHF, HTN, HLD, COPD, osteomyelitis, RT AKA, chronic L leg wounds, CKD3, DM2, resident of a facility  [facility] Events: Including procedures, antibiotic start and stop dates in addition to other pertinent events   1/9 Presented to [facility]. PCCM consult. ceftriaxone and azithromycin> Vanc cefepime. BC> UC>  Interim History / Subjective:  See above  Subjective exam limited d/t bipap. Endorses some SOB, denies chest pain, wants BiPAP off, denies abdominal pain  Objective   Blood pressure (!) 147/71, pulse 94, temperature 99.2 F (37.3 C), temperature source Temporal, resp. rate 20, height 6' (1.829 m), weight 112.5 kg, SpO2 96 %.    Vent Mode: BIPAP;PCV FiO2 (%):  [60 %] 60 % Set Rate:  [15 bmp] 15 bmp PEEP:  [8 cmH20] 8 cmH20  No intake or output data in the 24 hours  ending 06/26/21 0334 Filed Weights   06/26/21 0051  Weight: 112.5 kg    Examination: General: In bed, some distress, uncomfortable, chronically ill appearing, obese HEENT: MM pink/moist, anicteric, atraumatic Neuro: RASS +1, PERRL 57mm, MAE, alert CV: S1S2, NSR, no m/r/g appreciated PULM:  air movement in all lobes lower lobes, trachea midline, chest expansion symmetric GI: soft, bsx4 active, non-tender   Extremities: warm/dry, L aka, LLE edema, capillary refill less than 3 seconds  Skin: see images below, no other rashes or lesions noted    L gluteal PI above    L leg wounds above with edema    7.38/41/106/24 lactate 1.0,  BNP 182 troponin 24 WBC 13 HGB 8.8 Creat 2.2 Albumin 3 Bun 33 Glucose 195 COVID, Flu neg 12 lead with no ST changes noted CXR concerning for pneumonia bilaterally Resolved Hospital Problem list     Assessment & Plan:  Community acquired pneumonia Acute respiratory failure with hypoxia, secondary to CAP HX COPD Presented with SOB, cough, hypoxia. WBC 13.4. Reported fever of 101 PTA. CXR concerning for pneumonia bilaterally. Continues to [place]at when taking BiPAP off. Resident of facility -Admit to ICU with continuous tele and SPO2 monitoring  -Broad spectrum antibiotics. Narrow as cultures result -Follow up BC, UC, ECHO -Continue BiPAP, monitor closely for intubation -Start low dose precedex. Goal RASS 0. Titrate to Goal. -PRN albuterol -NPO. Desats rapidly when BiPAP removed. Risk for intubation.  HX CHF  HX HTN HX HLD Troponin elevation Normotensive, BNP 182, Troponin 24, 12 lead with no st changes. Suspect troponin elevated secondary to demand ?volume overload component -Holding PO medications at this time due to desats off bipap -Furosemide 19m IV X1, discussed with Dr. AElwyn Reach-Obtain ECHO -Resume home medications as patient progresses -Trend troponin   DM2 -Blood Glucose goal 140-180. -SSI -Unclear when patient received last  dose of lantus  CKD3 Creat 2.28, baseline 1.7-1.9 -Ensure renal perfusion. Goal MAP 65 or greater. -Avoid neprotoxic drugs as possible. -Strict I&O's -Follow up AM creatinine  Suspect chronic anemia Secondary to kidney disease -Transfuse PRBC if HBG less than 7 -Obtain AM CBC to trend H&H -Monitor for signs of bleeding  Pressure injury POA Chronic L leg wound Patient states L leg as swollen as it normally is. -Wound care consult -RD consult -On broad spectrum abx   Best Practice (right click and "Reselect all SmartList Selections" daily)   Diet/type: NPO DVT prophylaxis: prophylactic heparin  GI prophylaxis: PPI Lines: N/A Foley:  N/A Code Status:  full code Last date of multidisciplinary goals of care discussion [pending]  Labs   CBC: Recent Labs  Lab 06/26/21 0050 06/26/21 0203  WBC 13.4*  --   NEUTROABS 11.2*  --   HGB 8.8* 9.2*  HCT 28.8* 27.0*  MCV 92.9  --   PLT 240  --     Basic Metabolic Panel: Recent Labs  Lab 06/26/21 0050 06/26/21 0203  NA 139 138  K 4.7 4.6  CL 106  --   CO2 25  --   GLUCOSE 195*  --   BUN 33*  --   CREATININE 2.28*  --   CALCIUM 8.3*  --    GFR: Estimated Creatinine Clearance: 43.5 mL/min (A) (by C-G formula based on SCr of 2.28 mg/dL (H)). Recent Labs  Lab 06/26/21 0038 06/26/21 0050  WBC  --  13.4*  LATICACIDVEN 1.0  --     Liver Function Tests: Recent Labs  Lab 06/26/21 0050  AST 12*  ALT 10  ALKPHOS 84  BILITOT 0.7  PROT 7.2  ALBUMIN 3.0*   No results for input(s): LIPASE, AMYLASE in the last 168 hours. No results for input(s): AMMONIA in the last 168 hours.  ABG    Component Value Date/Time   PHART 7.381 06/26/2021 0203   PCO2ART 41.8 06/26/2021 0203   PO2ART 106 06/26/2021 0203   HCO3 24.8 06/26/2021 0203   TCO2 26 06/26/2021 0203   ACIDBASEDEF 4.0 (H) 11/08/2015 0329   O2SAT 98.0 06/26/2021 0203     Coagulation Profile: No results for input(s): INR, PROTIME in the last 168  hours.  Cardiac Enzymes: No results for input(s): CKTOTAL, CKMB, CKMBINDEX, TROPONINI in the last 168 hours.  HbA1C: Hemoglobin A1C  Date/Time Value Ref Range Status  08/14/2016 12:00 AM 5.6  Final  01/18/2016 12:00 AM 5.5  Final   Hgb A1c MFr Bld  Date/Time Value Ref Range Status  05/23/2019 06:09 AM 8.6 (H) 4.8 - 5.6 % Final    Comment:    (NOTE) Pre diabetes:          5.7%-6.4% Diabetes:              >6.4% Glycemic control for   <7.0% adults with diabetes   11/17/2018 04:06 AM 7.7 (H) 4.8 - 5.6 % Final    Comment:    (NOTE) Pre diabetes:          5.7%-6.4% Diabetes:              >  6.4% Glycemic control for   <7.0% adults with diabetes     CBG: No results for input(s): GLUCAP in the last 168 hours.  Review of Systems:   Positives in bold  Gen: fever, chills, weight change, fatigue, night sweats HEENT:  blurred vision, double vision, hearing loss, tinnitus, sinus congestion, rhinorrhea, sore throat, neck stiffness, dysphagia PULM:  shortness of breath, cough, sputum production, hemoptysis, wheezing CV: chest pain, edema, orthopnea, paroxysmal nocturnal dyspnea, palpitations GI:  abdominal pain, nausea, vomiting, diarrhea, hematochezia, melena, constipation, change in bowel habits GU: dysuria, hematuria, polyuria, oliguria, urethral discharge Endocrine: hot or cold intolerance, polyuria, polyphagia or appetite change Derm: rash, dry skin, scaling or peeling skin change, leg swelling Heme: easy bruising, bleeding, bleeding gums Neuro: headache, numbness, weakness, slurred speech, loss of memory or consciousness   Past Medical History:  He,  has a past medical history of Acquired absence of left great toe ([place]), Acquired absence of right leg below knee ([place]), Acute osteomyelitis of calcaneum, right ([place]) (10/02/2016), Acute respiratory failure (HCC), Amputation stump infection ([place]) (08/12/2018), Anemia, Basal cell carcinoma, eyelid, Cancer (HCC), Candidiasis (08/12/2018),  CHF (congestive heart failure) (HCC), Chronic back pain, Chronic kidney disease, Chronic multifocal osteomyelitis (HCC), Chronic pain syndrome, COPD (chronic obstructive pulmonary disease) ([place]), DDD (degenerative disc disease), lumbar, Depression, Diabetes mellitus without complication ([place]), Diabetic ulcer of toe of left foot ([place]) (10/02/2016), Difficulty in walking, not elsewhere classified, 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), Foot drop, bilateral, Foot drop, right (12/27/2015), GERD (gastroesophageal reflux disease), Herpesviral vesicular dermatitis, Hyperlipidemia, Hyperlipidemia, Hypertension, Insomnia, Malignant melanoma of other parts of face [facility]), MRSA bacteremia, Muscle weakness (generalized), Nasal congestion, Necrosis of toe (HCC) (07/08/2018), Neuromuscular dysfunction of bladder, Osteomyelitis ([place]), Osteomyelitis ([place]), Osteomyelitis ([place]), Other idiopathic peripheral autonomic neuropathy, Retention of urine, unspecified, Squamous cell carcinoma of skin, Unsteadiness on feet, Urinary retention, Urinary retention, Urinary tract infection, Wears dentures, and Wears glasses.   Surgical History:   Past Surgical History:  Procedure Laterality Date   AMPUTATION Left 03/29/2017   Procedure: LEFT GREAT TOE AMPUTATION AT METATARSOPHALANGEAL JOINT;  Surgeon: [name], MD;  Location: [place];  Service: Orthopedics;  Laterality: Left;   AMPUTATION Right 03/29/2017   Procedure: RIGHT BELOW KNEE AMPUTATION;  Surgeon: [name], MD;  Location: [place];  Service: Orthopedics;  Laterality: Right;   AMPUTATION Left 06/06/2018   Procedure: LEFT 2ND TOE AMPUTATION;  Surgeon: [name], MD;  Location: [place];  Service: Orthopedics;  Laterality: Left;   AMPUTATION Right 11/19/2018   Procedure: AMPUTATION ABOVE KNEE;  Surgeon: [name], MD;  Location: [place];  Service: Orthopedics;  Laterality: Right;   ANTERIOR CERVICAL CORPECTOMY N/A 11/25/2015   Procedure: ANTERIOR CERVICAL FIVE CORPECTOMY Cervical  four - six fusion;  Surgeon: Consuella Lose, MD;  Location: [place] [facility];  Service: Neurosurgery;  Laterality: N/A;  ANTERIOR CERVICAL FIVE CORPECTOMY Cervical four - six fusion   APPLICATION OF WOUND VAC Right 10/22/2018   Procedure: Application Of Wound Vac;  Surgeon: [name], MD;  Location: [place];  Service: Orthopedics;  Laterality: Right;   CYSTOSCOPY WITH RETROGRADE URETHROGRAM N/A 04/25/2018   Procedure: CYSTOSCOPY WITH RETROGRADE URETHROGRAM/ BALLOON DILATION;  Surgeon: Kathie Rhodes, MD;  Location: [facility];  Service: Urology;  Laterality: N/A;   MULTIPLE TOOTH EXTRACTIONS     POSTERIOR CERVICAL FUSION/FORAMINOTOMY N/A 11/29/2015   Procedure: Cervical Three-Cervical Seven Posterior Cervical Laminectomy with Fusion;  Surgeon: Consuella Lose, MD;  Location: [place] [facility];  Service: Neurosurgery;  Laterality: N/A;  Cervical Three-Cervical Seven Posterior Cervical Laminectomy with Fusion   STUMP REVISION Right 10/22/2018   Procedure: REVISION RIGHT BELOW KNEE AMPUTATION;  Surgeon: [name], MD;  Location: [place];  Service: Orthopedics;  Laterality: Right;   STUMP REVISION Right 12/05/2018   Procedure: Revision Right Above Knee Amputation;  Surgeon: [name], MD;  Location: [place];  Service: Orthopedics;  Laterality: Right;   STUMP REVISION Right 05/29/2019   Procedure: REVISION RIGHT ABOVE KNEE AMPUTATION;  Surgeon: [name], MD;  Location: [place];  Service: Orthopedics;  Laterality: Right;   STUMP REVISION Right 06/24/2019   Procedure: STUMP REVISION;  Surgeon: [name], MD;  Location: [place];  Service: Orthopedics;  Laterality: Right;     Social History:   reports that he has been smoking cigarettes. He has been smoking an average of .5 packs per day. He has never used smokeless tobacco. He reports that he does not drink alcohol and does not use drugs.   Family History:  His family history includes Bone cancer in his father; Cancer in his father, mother, paternal  grandfather, and another family member; Diabetes in his mother and another family member; Heart disease in his mother; Prostate cancer in his father.   Allergies No Known Allergies   Home Medications  Prior to Admission medications   Medication Sig Start Date End Date Taking? Authorizing Provider  amLODipine (NORVASC) 10 MG tablet Take 10 mg by mouth daily. for HTN   Yes [provider]  buPROPion (WELLBUTRIN XL) 300 MG 24 hr tablet Take 300 mg by mouth daily.   Yes [provider]  cetirizine (ZYRTEC) 10 MG tablet Take 10 mg by mouth daily.   Yes [provider]  docusate sodium (COLACE) 100 MG capsule Take 1 capsule (100 mg total) by mouth 2 (two) times daily. 05/31/19  Yes Amin, Ankit Chirag, MD  DULoxetine (CYMBALTA) 60 MG capsule Take 60 mg by mouth daily.   Yes [provider]  Eszopiclone 3 MG TABS Take 3 mg by mouth at bedtime. 09/29/20  Yes [provider]  ferrous sulfate 325 (65 FE) MG tablet Take 1 tablet (325 mg total) by mouth daily with breakfast. 11/23/18  Yes Matilde Haymaker, MD  furosemide (LASIX) 20 MG tablet Take 20 mg by mouth every Tuesday, Thursday, Saturday, and Sunday.   Yes [provider]  furosemide (LASIX) 40 MG tablet Take 40 mg by mouth every Monday, Wednesday, and Friday.   Yes [provider]  glipiZIDE (GLUCOTROL) 10 MG tablet Take 10 mg by mouth daily before breakfast.   Yes [provider]  insulin glargine (LANTUS) 100 UNIT/ML injection Inject 14 Units into the skin at bedtime.   Yes [provider]  Lidocaine 4 % PTCH Apply 1 patch topically daily.   Yes [provider]  NON FORMULARY 1 application See admin instructions. Compression stockings to left leg, leave on for 12 hours then remove   Yes [provider]  omeprazole (PRILOSEC) 20 MG capsule Take 20 mg by mouth at bedtime.   Yes [provider]  oxybutynin (DITROPAN-XL) 10 MG 24 hr tablet Take 10 mg  by mouth daily.   Yes [provider]  rosuvastatin (CRESTOR) 10 MG tablet Take 10 mg by mouth at bedtime.   Yes [provider]  senna-docusate (SENOKOT-S) 8.6-50 MG tablet Take 1 tablet by mouth at bedtime.   Yes [provider]  tamsulosin (FLOMAX) 0.4 MG CAPS capsule Take  0.4 mg by mouth every evening.  11/26/13  Yes [provider]  Tiotropium Bromide Monohydrate 1.25 MCG/ACT AERS Inhale 2 puffs into the lungs daily.   Yes [provider]  vitamin B-12 (CYANOCOBALAMIN) 500 MCG tablet Take 1,000 mcg by mouth daily.    Yes [provider]  acetaminophen (TYLENOL) 325 MG tablet Take 650 mg by mouth every 4 (four) hours as needed (general discomfort).     [provider]  albuterol (PROVENTIL HFA;VENTOLIN HFA) 108 (90 Base) MCG/ACT inhaler Inhale 2 puffs into the lungs every 6 (six) hours as needed for wheezing or shortness of breath.    [provider]  alum & mag hydroxide-simeth (MAALOX PLUS) 400-400-40 MG/5ML suspension Take by mouth.    [provider]  Cholecalciferol (VITAMIN D) 50 MCG (2000 UT) tablet Take 2,000 Units by mouth daily.    [provider]  Cholecalciferol (VITAMIN D-3) 125 MCG (5000 UT) TABS Take 1 tablet by mouth daily.    [provider]  Cholecalciferol (VITAMIN D-3) 5000 units TABS Take 5,000 Units by mouth daily.    [provider]  diphenhydrAMINE (SOMINEX) 25 MG tablet Take by mouth.    [provider]  eszopiclone (LUNESTA) 1 MG TABS tablet Take 1 mg by mouth at bedtime. Take immediately before bedtime    [provider]  finasteride (PROSCAR) 5 MG tablet Take 5 mg by mouth daily.    [provider]  glimepiride (AMARYL) 2 MG tablet Take by mouth.    [provider]  insulin aspart (NOVOLOG) 100 UNIT/ML injection Inject 0-9 Units into the skin 3 (three) times daily with meals. 07/18/19 03/08/21  Elodia Florence., MD  insulin  lispro (HUMALOG) 100 UNIT/ML KwikPen Inject 16 Units into the skin 3 (three) times daily before meals.     [provider]  magnesium oxide (MAG-OX) 400 MG tablet Take 400 mg by mouth 2 (two) times daily.    [provider]  metoprolol tartrate (LOPRESSOR) 25 MG tablet Take 0.5 tablets (12.5 mg total) by mouth 2 (two) times daily. 07/18/19 03/08/21  Elodia Florence., MD  mirabegron ER (MYRBETRIQ) 50 MG TB24 tablet Take 50 mg by mouth daily.    [provider]  mometasone (NASONEX) 50 MCG/ACT nasal spray Place 2 sprays into the nose at bedtime.     [provider]  naloxone [facility]) 4 MG/0.1ML LIQD nasal spray kit Place into the nose.    [provider]  ondansetron (ZOFRAN) 4 MG tablet Take by mouth.    [provider]  oxycodone (OXY-IR) 5 MG capsule Take 5 mg by mouth every 8 (eight) hours.    [provider]  oxycodone (ROXICODONE) 30 MG immediate release tablet Take 30 mg by mouth every 8 (eight) hours as needed.    [provider]  PARoxetine (PAXIL) 10 MG tablet Take 10 mg by mouth at bedtime.     [provider]  promethazine (PHENERGAN) 25 MG tablet Take by mouth.    [provider]  triamcinolone (KENALOG) 0.025 % ointment Apply 1 application topically 2 (two) times daily. 09/08/20   Vu, Rockey Situ, MD  vitamin C (ASCORBIC ACID) 250 MG tablet Take 500 mg by mouth 2 (two) times daily.    [provider]  zinc sulfate 220 (50 Zn) MG capsule Take 220 mg by mouth daily.    [provider]     Critical care time: 40 minutes  [employer]., MSN, APRN, AGACNP-BC [facility]  06/26/2021 , 4:30 AM  Please see [email or web address] for pager details  If no response, please call [phone] After hours, please call Elink at [phone]

## 2021-06-26 NOTE — Progress Notes (Signed)
[facility] Progress Note Patient Name: Gregory [name] DOB: Oct 20, 1960 MRN: [id number]   Date of Service  06/26/2021  HPI/Events of Note  Brief MLY:YTKP off from NP:  61 YO M. Resident of a facility.  PMX CHF, COPD, DM2, Osteo, Rt AKA.  Presented after two days of cough and SOB. Found to be hypoxic. Started on BiPAP.  CXR concerning for pneumonia. on broad spectrum ABX. Cultures ordered.  Low dose precedex for anxiety on bipap.  60 MG IV lasix once ED.   Notes, labs reviewed.  Data: Reviewed. 7.38/41/106, BNP 182 Cr 2.2 Trop 24 Covid/flu neg. EKG: sinus tachy. No STEMI CxR film seen: per hilar congestion, RLL > left air space densities. ? Effusion at base vs atelectasis. Cervical spine fusion hardware.  Camera:Obese. HR  91, sats 92%. Coughing. MAP 84. 05/25/14/60%. Able to protect airways. No abdominal breathing. Yet to start precedex and lasix.    Plan: As above. Trend troponin Follow Pneumonia work up Continue COPD inhalers as at home. On protonix.  VTE:SQ heparin.  CBG goals < 180, on ssi.  eICU Interventions  As above.      Intervention Category Major Interventions: Respiratory failure - evaluation and management Intermediate Interventions: Best-practice therapies (e.g. DVT, beta blocker, etc.) Evaluation Type: New Patient Evaluation  Elmer Sow 06/26/2021, 4:47 AM

## 2021-06-26 NOTE — Progress Notes (Incomplete)
Echocardiogram 2D Echocardiogram has been performed.  Gregory [name] 06/26/2021, 11:32 AM

## 2021-06-26 NOTE — Progress Notes (Signed)
Initial Nutrition Assessment  DOCUMENTATION CODES:   Not applicable  INTERVENTION:   - Ensure Max po daily, each supplement provides 150 kcal and 30 grams of protein.   - ProSource Plus po daily, each supplement provides 100 kcal and 15 grams of protein  - Renal MVI daily  - Encourage ongoing adequate PO intake  NUTRITION DIAGNOSIS:   Increased nutrient needs related to wound healing as evidenced by estimated needs.  GOAL:   Patient will meet greater than or equal to 90% of their needs  MONITOR:   PO intake, Supplement acceptance, Labs, Weight trends, Skin, I & O's  REASON FOR ASSESSMENT:   Consult Wound healing  ASSESSMENT:   61 year old male who presented to the ED on 1/09 with fever, cough, SOB. PMH of COPD, CHF, T2DM, HTN, HLD, osteomyelitis, previous R AKA, chronic LLE wounds, CKD stage III. Pt admitted with CAP.  Discussed pt with RN and during ICU rounds. Diet advanced to Carb Modified today. Pt off BiPAP and precedex drip is off.  Spoke with pt at bedside. Pt in good spirits at time of RD visit. Pt reports that he is hungry and is glad to know that his diet has been advanced and that he will receive lunch. Per nursing staff, pt asking for a grilled cheese sandwich.  Pt reports consuming 3 meals daily at his facility. He states that he eats well and eats most of his food if it is something that he likes. Pt reports that he receives one Pro-stat daily and tolerates this well. Pt states that he does not receive protein shakes but is willing to try these in addition to the ProSource while admitted. RD to order.  Pt denies recent weight loss and reports actually having gained weight recently. He reports that his UBW is between 225-230 lbs. Pt believes that he has gained weight due to fluid overload. Pt states that he has been asking the providers at his facility to increase his lasix dose. Pt currently with mild pitting generalized edema and deep pitting edema to  LLE.  Discussed importance of adequate calorie and protein intake to promote wound healing. Pt expresses understanding. Pt willing to consume oral nutrition supplements in addition to taking MVI. Pt reports that he takes "a lot of different vitamins" at his facility but is unsure whether he takes a MVI.  Meal Completion: 100% x 1 meal today (lunch meal)  Medications reviewed and include: SSI q 4 hours, protonix, IV abx  Labs reviewed: BUN 33, creatinine 2.28, hemoglobin 9.2 CBG's: 110-185  I/O's: +540 ml since admit  NUTRITION - FOCUSED PHYSICAL EXAM:  Flowsheet Row Most Recent Value  Orbital Region No depletion  Upper Arm Region No depletion  Thoracic and Lumbar Region No depletion  Buccal Region No depletion  Temple Region No depletion  Clavicle Bone Region Mild depletion  Clavicle and Acromion Bone Region Mild depletion  Scapular Bone Region No depletion  Dorsal Hand No depletion  Patellar Region No depletion  Anterior Thigh Region No depletion  Posterior Calf Region No depletion  Edema (RD Assessment) Moderate  [LLE]  Hair Reviewed  Eyes Reviewed  Mouth Reviewed  Skin Reviewed  Nails Reviewed       Diet Order:   Diet Order             Diet Carb Modified Fluid consistency: Thin; Room service appropriate? Yes  Diet effective now  EDUCATION NEEDS:   Education needs have been addressed  Skin:  Skin Assessment: Skin Integrity Issues: Stage III: L buttocks Other: hyperkeratotic tissue L knee and L ankle  Last BM:  06/24/21  Height:   Ht Readings from Last 1 Encounters:  06/26/21 6' (1.829 m)    Weight:   Wt Readings from Last 1 Encounters:  06/26/21 111.9 kg    BMI:  Body mass index is 33.46 kg/m.  Estimated Nutritional Needs:   Kcal:  2300-2500  Protein:  120-135 grams  Fluid:  >/= 2.0 L    Gustavus Bryant, MS, RD, LDN Inpatient Clinical Dietitian Please see AMiON for contact information.

## 2021-06-26 NOTE — ED Provider Notes (Signed)
[facility]PART[facility] Provider Note   CSN: [id number] Arrival date & time: 06/26/21  0045     History  Chief Complaint  Patient presents with   Shortness of Breath   Fever   Cough    Gregory [name] is a 61 y.o. male.  The history is provided by the EMS personnel and the patient. No language interpreter was used.  Shortness of Breath Severity:  Severe Onset quality:  Gradual Duration:  2 days Timing:  Constant Progression:  Worsening Chronicity:  New Context: URI   Relieved by:  Nothing Worsened by:  Coughing Ineffective treatments:  None tried Associated symptoms: cough, fever and sputum production   Associated symptoms: no abdominal pain, no chest pain, no headaches, no hemoptysis, no rash, no vomiting and no wheezing   Risk factors: no hx of PE/DVT   Fever Max temp prior to arrival:  101 Temp source:  Oral Severity:  Moderate Onset quality:  Gradual Duration:  1 day Relieved by:  Nothing Associated symptoms: chills, congestion and cough   Associated symptoms: no chest pain, no diarrhea, no dysuria, no headaches, no nausea, no rash and no vomiting   Cough Associated symptoms: chills, fever and shortness of breath   Associated symptoms: no chest pain, no headaches, no rash and no wheezing       Home Medications Prior to Admission medications   Medication Sig Start Date End Date Taking? Authorizing Provider  acetaminophen (TYLENOL) 325 MG tablet Take 650 mg by mouth every 4 (four) hours as needed (general discomfort).     [provider]  albuterol (PROVENTIL HFA;VENTOLIN HFA) 108 (90 Base) MCG/ACT inhaler Inhale 2 puffs into the lungs every 6 (six) hours as needed for wheezing or shortness of breath.    [provider]  alum & mag hydroxide-simeth (MAALOX PLUS) 400-400-40 MG/5ML suspension Take by mouth.    [provider]  amLODipine (NORVASC) 10 MG tablet Take 10 mg by mouth daily. for HTN    [provider]  buPROPion (WELLBUTRIN XL) 300 MG 24 hr tablet Take 300 mg by mouth at bedtime.    [provider]  cetirizine (ZYRTEC) 10 MG tablet Take 10 mg by mouth daily.    [provider]  Cholecalciferol (VITAMIN D) 50 MCG (2000 UT) tablet Take 2,000 Units by mouth daily.    [provider]  Cholecalciferol (VITAMIN D-3) 125 MCG (5000 UT) TABS Take 1 tablet by mouth daily.    [provider]  Cholecalciferol (VITAMIN D-3) 5000 units TABS Take 5,000 Units by mouth daily.    [provider]  diphenhydrAMINE (SOMINEX) 25 MG tablet Take by mouth.    [provider]  docusate sodium (COLACE) 100 MG capsule Take 1 capsule (100 mg total) by mouth 2 (two) times daily. 05/31/19   Amin, Ankit Chirag, MD  eszopiclone (LUNESTA) 1 MG TABS tablet Take 1 mg by mouth at bedtime. Take immediately before bedtime    [provider]  Eszopiclone 3 MG TABS Take 3 mg by mouth at bedtime. 09/29/20   [provider]  ferrous sulfate 325 (65 FE) MG tablet Take 1 tablet (325 mg total) by mouth daily with breakfast. 11/23/18   Matilde Haymaker, MD  finasteride (PROSCAR) 5 MG tablet Take 5 mg by mouth daily.    [provider]  furosemide (LASIX) 40 MG tablet Take by mouth.    [provider]  glimepiride (AMARYL) 2 MG tablet Take by  mouth.    [provider]  glipiZIDE (GLUCOTROL) 10 MG tablet Take 10 mg by mouth daily before breakfast.    [provider]  insulin aspart (NOVOLOG) 100 UNIT/ML injection Inject 0-9 Units into the skin 3 (three) times daily with meals. 07/18/19 03/08/21  Elodia Florence., MD  insulin glargine (LANTUS) 100 UNIT/ML injection Inject into the skin.    [provider]  insulin lispro (HUMALOG) 100 UNIT/ML KwikPen Inject 16 Units into the skin 3 (three) times daily before meals.     [provider]  LYRICA 300 MG capsule Take 150 mg by mouth 2 (two) times daily. 04/20/19    [provider]  magnesium oxide (MAG-OX) 400 MG tablet Take 400 mg by mouth 2 (two) times daily.    [provider]  metoprolol tartrate (LOPRESSOR) 25 MG tablet Take 0.5 tablets (12.5 mg total) by mouth 2 (two) times daily. 07/18/19 03/08/21  Elodia Florence., MD  mirabegron ER (MYRBETRIQ) 50 MG TB24 tablet Take 50 mg by mouth daily.    [provider]  mometasone (NASONEX) 50 MCG/ACT nasal spray Place 2 sprays into the nose at bedtime.     [provider]  naloxone [facility]) 4 MG/0.1ML LIQD nasal spray kit Place into the nose.    [provider]  omeprazole (PRILOSEC) 20 MG capsule Take 40 mg by mouth daily before breakfast.     [provider]  ondansetron (ZOFRAN) 4 MG tablet Take by mouth.    [provider]  oxybutynin (DITROPAN-XL) 10 MG 24 hr tablet Take 10 mg by mouth at bedtime.    [provider]  oxycodone (OXY-IR) 5 MG capsule Take 5 mg by mouth every 8 (eight) hours.    [provider]  oxycodone (ROXICODONE) 30 MG immediate release tablet Take 30 mg by mouth every 8 (eight) hours as needed.    [provider]  PARoxetine (PAXIL) 10 MG tablet Take 10 mg by mouth at bedtime.     [provider]  promethazine (PHENERGAN) 25 MG tablet Take by mouth.    [provider]  rosuvastatin (CRESTOR) 10 MG tablet Take 10 mg by mouth at bedtime.    [provider]  senna-docusate (SENOKOT-S) 8.6-50 MG tablet Take 2 tablets by mouth at bedtime.    [provider]  tamsulosin (FLOMAX) 0.4 MG CAPS capsule Take 0.4 mg by mouth every evening.  11/26/13   [provider]  Tiotropium Bromide Monohydrate 1.25 MCG/ACT AERS Inhale 2 puffs into the lungs daily.    [provider]  triamcinolone (KENALOG) 0.025 % ointment Apply 1 application topically 2 (two) times daily. 09/08/20   Vu, Rockey Situ, MD  vitamin B-12 (CYANOCOBALAMIN) 500 MCG tablet Take 1,000 mcg by  mouth daily.     [provider]  vitamin C (ASCORBIC ACID) 250 MG tablet Take 500 mg by mouth 2 (two) times daily.    [provider]  zinc sulfate 220 (50 Zn) MG capsule Take 220 mg by mouth daily.    [provider]      Allergies    Patient has no known allergies.    Review of Systems   Review of Systems  Constitutional:  Positive for chills, fatigue and fever.  HENT:  Positive for congestion.   Respiratory:  Positive for cough, sputum production, chest tightness and shortness of breath. Negative for hemoptysis and wheezing.   Cardiovascular:  Positive for leg swelling (chronic and  unchanged per pt). Negative for chest pain and palpitations.  Gastrointestinal:  Negative for abdominal pain, constipation, diarrhea, nausea and vomiting.  Genitourinary:  Negative for dysuria and flank pain.  Musculoskeletal:  Negative for back pain.  Skin:  Negative for rash and wound.  Neurological:  Negative for light-headedness and headaches.  Psychiatric/Behavioral:  Negative for agitation.   All other systems reviewed and are negative.  Physical Exam Updated Vital Signs BP (!) 141/100 (BP Location: Right Arm)    Pulse 83    Resp 20    Ht 6' (1.829 m)    Wt 112.5 kg    SpO2 90%    BMI 33.63 kg/m  Physical Exam Vitals and nursing note reviewed.  Constitutional:      General: He is in acute distress.     Appearance: He is well-developed. He is ill-appearing.  HENT:     Head: Normocephalic and atraumatic.  Eyes:     Conjunctiva/sclera: Conjunctivae normal.  Cardiovascular:     Rate and Rhythm: Normal rate and regular rhythm.     Heart sounds: No murmur heard. Pulmonary:     Effort: Tachypnea and accessory muscle usage present. No respiratory distress.     Breath sounds: Rhonchi and rales present. No wheezing.  Chest:     Chest wall: No tenderness.  Abdominal:     Palpations: Abdomen is soft.     Tenderness: There is no abdominal tenderness.  Musculoskeletal:         General: No swelling.     Cervical back: Neck supple.     Left lower leg: Edema present.  Skin:    General: Skin is warm and dry.     Capillary Refill: Capillary refill takes less than 2 seconds.     Findings: No erythema.  Neurological:     General: No focal deficit present.     Mental Status: He is alert.  Psychiatric:        Mood and Affect: Mood normal.    ED Results / Procedures / Treatments   Labs (all labs ordered are listed, but only abnormal results are displayed) Labs Reviewed  CBC WITH DIFFERENTIAL/PLATELET - Abnormal; Notable for the following components:      Result Value   WBC 13.4 (*)    RBC 3.10 (*)    Hemoglobin 8.8 (*)    HCT 28.8 (*)    RDW 16.2 (*)    Neutro Abs 11.2 (*)    Monocytes Absolute 1.1 (*)    All other components within normal limits  COMPREHENSIVE METABOLIC PANEL - Abnormal; Notable for the following components:   Glucose, Bld 195 (*)    BUN 33 (*)    Creatinine, Ser 2.28 (*)    Calcium 8.3 (*)    Albumin 3.0 (*)    AST 12 (*)    GFR, Estimated 32 (*)    All other components within normal limits  BRAIN NATRIURETIC PEPTIDE - Abnormal; Notable for the following components:   B Natriuretic Peptide 182.7 (*)    All other components within normal limits  I-STAT ARTERIAL BLOOD GAS, ED - Abnormal; Notable for the following components:   HCT 27.0 (*)    Hemoglobin 9.2 (*)    All other components within normal limits  TROPONIN I (HIGH SENSITIVITY) - Abnormal; Notable for the following components:   Troponin I (High Sensitivity) 24 (*)    All other components within normal limits  RESP PANEL BY RT-PCR (FLU A&B, COVID) ARPGX2  CULTURE,  BLOOD (ROUTINE X 2)  CULTURE, BLOOD (ROUTINE X 2)  URINE CULTURE  LACTIC ACID, PLASMA  URINALYSIS, ROUTINE W REFLEX MICROSCOPIC  HIV ANTIBODY (ROUTINE TESTING W REFLEX)  CBC  CREATININE, SERUM  PROCALCITONIN  HEMOGLOBIN A1C  TROPONIN I (HIGH SENSITIVITY)    EKG EKG  Interpretation  Date/Time:  Monday June 26 2021 01:51:16 EST Ventricular Rate:  105 PR Interval:  204 QRS Duration: 111 QT Interval:  309 QTC Calculation: 409 R Axis:   107 Text Interpretation: Sinus tachycardia Right axis deviation When compared to prior, faster rate. No STEMI Confirmed by Antony Blackbird [phone]) on 06/26/2021 1:55:10 AM  Radiology DG Chest Port 1 View  Result Date: 06/26/2021 CLINICAL DATA:  Sepsis. EXAM: PORTABLE CHEST 1 VIEW COMPARISON:  Chest radiograph dated 07/11/2019. CT dated 10/19/2020. FINDINGS: Mild cardiomegaly. Diffuse bilateral interstitial and airspace densities may represent pulmonary edema, pneumonia, or combination. Clinical correlation is recommended. No pleural effusion pneumothorax. No acute osseous pathology. Cervical fusion. IMPRESSION: Diffuse bilateral interstitial and airspace densities may represent pulmonary edema, pneumonia, or combination. Electronically Signed   By: Anner Crete M.D.   On: 06/26/2021 02:07    Procedures Procedures    CRITICAL CARE Performed by: Gwenyth Allegra Bita Cartwright Total critical care time: 45 minutes Critical care time was exclusive of separately billable procedures and treating other patients. Critical care was necessary to treat or prevent imminent or life-threatening deterioration. Critical care was time spent personally by me on the following activities: development of treatment plan with patient and/or surrogate as well as nursing, discussions with consultants, evaluation of patient's response to treatment, examination of patient, obtaining history from patient or surrogate, ordering and performing treatments and interventions, ordering and review of laboratory studies, ordering and review of radiographic studies, pulse oximetry and re-evaluation of patient's condition.   Medications Ordered in ED Medications  docusate sodium (COLACE) capsule 100 mg (has no administration in time range)  polyethylene glycol  (MIRALAX / GLYCOLAX) packet 17 g (has no administration in time range)  heparin injection 5,000 Units (has no administration in time range)  pantoprazole (PROTONIX) injection 40 mg (has no administration in time range)  albuterol (PROVENTIL) (2.5 MG/3ML) 0.083% nebulizer solution 2.5 mg (has no administration in time range)  insulin aspart (novoLOG) injection 0-20 Units (has no administration in time range)  dexmedetomidine (PRECEDEX) 400 MCG/100ML (4 mcg/mL) infusion (has no administration in time range)  furosemide (LASIX) injection 60 mg (has no administration in time range)  cefTRIAXone (ROCEPHIN) 1 g in sodium chloride 0.9 % 100 mL IVPB (0 g Intravenous Stopped 06/26/21 0226)  azithromycin (ZITHROMAX) 500 mg in sodium chloride 0.9 % 250 mL IVPB (0 mg Intravenous Stopped 06/26/21 0349)  ondansetron (ZOFRAN) injection 4 mg (4 mg Intravenous Given 06/26/21 0243)    ED Course/ Medical Decision Making/ A&P                           Medical Decision Making  Gregory [name] is a 61 y.o. male with a past medical history significant for COPD, CHF, diabetes, hypertension, and previous right AKA who presents with 2 days of productive cough and 1 day of fevers, chills, shortness of breath.  According to patient, he developed a cough yesterday and has been coughing up green and yellow large sputum.  Denies hemoptysis.  Reports today started getting worsening shortness of breath.  According to EMS, patient's oxygen saturations were found to be in the 60s when they arrived and  he required a nonrebreather on 10 L to keep him between 85 and 90% oxygen saturation.  Patient is denying any chest pain or abdominal pain and also denies nausea, vomiting, constipation, diarrhea, or urinary symptoms.  He reportedly had a fever of 101 with EMS.  Denies any trauma.  Reports that his left leg is always swollen and this does not seem any different from an edema standpoint.  Does not take oxygen normally and says that his COPD is  normally not a problem.  On arrival, patient is in respiratory distress despite nonrebreather.  Patient has rhonchi in all lung fields as well as some rales.  No wheezing appreciated initially.  Right leg is amputated and left leg is nontender but edematous.  Patient reports it is unchanged from baseline.  He is able to answer some questions but is very short of breath.  Decision made to use BiPAP and this caused patient to have oxygen saturations in the 90s.  Given the patient's fevers, chills, tachycardia, tachypnea, hypoxia, and concern for a pulmonary source, will be started on antibiotics for presumptive pneumonia.  Will get other work-up and labs and cultures.  Given the patient's oxygen demand currently, and his history of difficult airway and the difficulty getting his oxygen saturation into the 90s, I anticipate we will touch base with critical care to have them see the patient to determine if he needs observation admission in the ICU versus stepdown unit.  Critical care team came to see the patient and agree he needs ICU level care.  Patient intermittently rubbed his BiPAP mask off and dropped into the 60s again.  We let him have something to drink and then put it back on and oxygen is back in the 90s.  Patient will be mated to the ICU.         Final Clinical Impression(s) / ED Diagnoses Final diagnoses:  Community acquired pneumonia, unspecified laterality  Hypoxia  Shortness of breath     Clinical Impression: 1. Community acquired pneumonia, unspecified laterality   2. Hypoxia   3. Shortness of breath     Disposition: Admit  This note was prepared with assistance of Dragon voice recognition software. Occasional wrong-word or sound-a-like substitutions may have occurred due to the inherent limitations of voice recognition software.     Stokely Jeancharles, Gwenyth Allegra, MD 06/26/21 [phone]

## 2021-06-26 NOTE — Progress Notes (Signed)
Pharmacy Antibiotic Note  Gregory [name] is a 61 y.o. male admitted on 06/26/2021 with pneumonia.  Pharmacy has been consulted for vancomycin and cefepime dosing.  CKD3 baseline SCr ~1.8, now 2.28.  Plan: Vancomycin 2000mg  IV x1; f/u SCr prior to redosing in case it is worsening. Cefepime 2g IV Q12H.  Height: 6' (182.9 cm) Weight: 112.5 kg (248 lb) IBW/kg (Calculated) : 77.6  Temp (24hrs), Avg:99.2 F (37.3 C), Min:99.2 F (37.3 C), Max:99.2 F (37.3 C)  Recent Labs  Lab 06/26/21 0038 06/26/21 0050  WBC  --  13.4*  CREATININE  --  2.28*  LATICACIDVEN 1.0  --     Estimated Creatinine Clearance: 43.5 mL/min (A) (by C-G formula based on SCr of 2.28 mg/dL (H)).    No Known Allergies   Thank you for allowing pharmacy to be a part of this patients care.  Wynona Neat, PharmD, BCPS  06/26/2021 4:36 AM

## 2021-06-26 NOTE — Progress Notes (Signed)
Gregory Sabins, NP aware that bladder scan result is 460 cc.

## 2021-06-26 NOTE — Progress Notes (Signed)
[facility]  S: admitted overnight for HCAP treated with NIMV Feels better this morning  Past Medical History:  Diagnosis Date   Acquired absence of left great toe ([place])    Acquired absence of right leg below knee ([place])    Acute osteomyelitis of calcaneum, right ([place]) 10/02/2016   Acute respiratory failure (HCC)    Amputation stump infection ([place]) 08/12/2018   Anemia    Basal cell carcinoma, eyelid    Cancer (HCC)    Candidiasis 08/12/2018   CHF (congestive heart failure) (HCC)    chronic diastolic    Chronic back pain    Chronic kidney disease    stage 3    Chronic multifocal osteomyelitis (HCC)    of ankle and foot    Chronic pain syndrome    COPD (chronic obstructive pulmonary disease) (HCC)    DDD (degenerative disc disease), lumbar    Depression    major depressive disorder    Diabetes mellitus without complication ([place])    type 2    Diabetic ulcer of toe of left foot ([place]) 10/02/2016   Difficulty in walking, not elsewhere classified    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   Foot drop, bilateral    Foot drop, right 12/27/2015   GERD (gastroesophageal reflux disease)    Herpesviral vesicular dermatitis    Hyperlipidemia    Hyperlipidemia    Hypertension    Insomnia    Malignant melanoma of other parts of face ([place])    MRSA bacteremia    Muscle weakness (generalized)    Nasal congestion    Necrosis of toe ([place]) 07/08/2018   Neuromuscular dysfunction of bladder    Osteomyelitis (HCC)    Osteomyelitis (HCC)    Osteomyelitis ([place])    right BKA   Other idiopathic peripheral autonomic neuropathy    Retention of urine, unspecified    Squamous cell carcinoma of skin    Unsteadiness on feet    Urinary retention    Urinary retention    Urinary tract infection    Wears dentures    Wears glasses    O:  Vitals:   06/26/21 1100 06/26/21 1130 06/26/21 1200 06/26/21 1300  BP: (!) 91/57 (!) 105/58 120/67 (!) 113/50  Pulse: 67 64 (!) 59 65  Resp: 20 18 18 13   Temp: 99.5 F (37.5 C)      TempSrc: Axillary     SpO2: 95% 94% 94% 98%  Weight:      Height:       Vent Mode: BIPAP FiO2 (%):  [60 %-100 %] 100 % Set Rate:  [15 bmp] 15 bmp PEEP:  [8 cmH20] 8 cmH20   Intake/Output Summary (Last 24 hours) at 06/26/2021 1315 Last data filed at 06/26/2021 1253 Gross per 24 hour  Intake 570.98 ml  Output 850 ml  Net -279.02 ml   General:  Resting comfortably in bed HENT: NCAT OP clear PULM: CTA B, normal effort CV: RRR, no mgr GI: BS+, soft, nontender MSK: normal bulk and tone Neuro: awake, alert, no distress, MAEW  CBC    Component Value Date/Time   WBC 13.4 (H) 06/26/2021 0050   RBC 3.10 (L) 06/26/2021 0050   HGB 9.2 (L) 06/26/2021 0203   HGB 10.7 (L) 02/24/2020 0938   HCT 27.0 (L) 06/26/2021 0203   PLT 240 06/26/2021 0050   PLT 221 02/24/2020 0938   MCV 92.9 06/26/2021 0050   MCH 28.4 06/26/2021 0050   MCHC 30.6 06/26/2021 0050   RDW 16.2 (H)  06/26/2021 0050   LYMPHSABS 0.9 06/26/2021 0050   MONOABS 1.1 (H) 06/26/2021 0050   EOSABS 0.1 06/26/2021 0050   BASOSABS 0.1 06/26/2021 0050   BMET    Component Value Date/Time   NA 138 06/26/2021 0203   NA 138 10/22/2016 0000   K 4.6 06/26/2021 0203   CL 106 06/26/2021 0050   CO2 25 06/26/2021 0050   GLUCOSE 195 (H) 06/26/2021 0050   BUN 33 (H) 06/26/2021 0050   BUN 26 (A) 10/22/2016 0000   CREATININE 2.28 (H) 06/26/2021 0050   CREATININE 1.95 (H) 02/24/2020 0938   CREATININE 2.67 (H) 10/02/2016 1651   CALCIUM 8.3 (L) 06/26/2021 0050   GFRNONAA 32 (L) 06/26/2021 0050   GFRNONAA 36 (L) 02/24/2020 0938   GFRNONAA 25 (L) 10/02/2016 1651   CXR 1/9 > diffuse hazy opacification in right lung > left, C-spine hardware noted  Impression: Acute hypoxemic respiratory failure CAP Chronic pain and chronic narcotic use Chronic kidney disease 3a  Plan: Switch from BIPAP to HHF Maintain in ICU setting Continue broad spectrum antibiotics Precedex  Add back oral narcotics, slightly reduced dose from home  settings Keep net even today  Additional cc time 35 minutes  Roselie Awkward, MD [facility] Pager: [phone] Cell: [phone] After 7:00 pm call [facility]  [phone]

## 2021-06-26 NOTE — Consult Note (Signed)
WOC Nurse Consult Note: Reason for Consult:Pressure injury Stage 3 to left buttocks, hyperkeratotic tissue to left knee, left ankle.  Wound type:pressure Pressure Injury POA: Yes Measurement: 3cm x 1cm x 0.4cm with pale pink, dry wound bed.  Wound bed:As noted above Drainage (amount, consistency, odor) Scant serous exudate Periwound: with evidence of previous wound healing, contraction and scarring of wound edges Dressing procedure/placement/frequency: I have provided guidance for the topical care of this wound using xeroform (nonadherent, antimicrobial) as a wound contact layer and topped with silicone foam. Turning and repositioning is in place and because patient is in ICU, he is on a mattress replacement with low air loss feature.  A silicone foam will be placed to the sacrum and the "tip" tucked down over the apex of the gluteal cleft where a healed and scarred area presents. The left [place] Comment is with amputations of digits, dry skin and hyperkeratotic tissue at the knee and ankle.  This is to be moisturized twice daily with our house moisturizer, Sween 24.  A pressure redistribution heel boot is provided for PI prevention to the left heel.  [place] nursing team will not follow, but will remain available to this patient, the nursing and medical teams.  Please re-consult if needed. Thanks, Maudie Flakes, MSN, RN, [place], Arther Abbott  Pager# [phone]

## 2021-06-27 ENCOUNTER — Inpatient Hospital Stay (HOSPITAL_COMMUNITY): Payer: Medicaid Other

## 2021-06-27 DIAGNOSIS — J9601 Acute respiratory failure with hypoxia: Secondary | ICD-10-CM | POA: Diagnosis not present

## 2021-06-27 LAB — CBC
HCT: 25.9 % — ABNORMAL LOW (ref 39.0–52.0)
Hemoglobin: 7.7 g/dL — ABNORMAL LOW (ref 13.0–17.0)
MCH: 28 pg (ref 26.0–34.0)
MCHC: 29.7 g/dL — ABNORMAL LOW (ref 30.0–36.0)
MCV: 94.2 fL (ref 80.0–100.0)
Platelets: 199 10*3/uL (ref 150–400)
RBC: 2.75 MIL/uL — ABNORMAL LOW (ref 4.22–5.81)
RDW: 16 % — ABNORMAL HIGH (ref 11.5–15.5)
WBC: 10.6 10*3/uL — ABNORMAL HIGH (ref 4.0–10.5)
nRBC: 0 % (ref 0.0–0.2)

## 2021-06-27 LAB — GLUCOSE, CAPILLARY
Glucose-Capillary: 214 mg/dL — ABNORMAL HIGH (ref 70–99)
Glucose-Capillary: 150 mg/dL — ABNORMAL HIGH (ref 70–99)
Glucose-Capillary: 167 mg/dL — ABNORMAL HIGH (ref 70–99)
Glucose-Capillary: 284 mg/dL — ABNORMAL HIGH (ref 70–99)
Glucose-Capillary: 204 mg/dL — ABNORMAL HIGH (ref 70–99)
Glucose-Capillary: 195 mg/dL — ABNORMAL HIGH (ref 70–99)

## 2021-06-27 LAB — LEGIONELLA PNEUMOPHILA SEROGP 1 UR AG: L. pneumophila Serogp 1 Ur Ag: NEGATIVE

## 2021-06-27 LAB — BASIC METABOLIC PANEL
Anion gap: 10 (ref 5–15)
BUN: 33 mg/dL — ABNORMAL HIGH (ref 8–23)
CO2: 23 mmol/L (ref 22–32)
Calcium: 8.1 mg/dL — ABNORMAL LOW (ref 8.9–10.3)
Chloride: 104 mmol/L (ref 98–111)
Creatinine, Ser: 2.44 mg/dL — ABNORMAL HIGH (ref 0.61–1.24)
GFR, Estimated: 29 mL/min — ABNORMAL LOW (ref >60)
Glucose, Bld: 153 mg/dL — ABNORMAL HIGH (ref 70–99)
Potassium: 4.4 mmol/L (ref 3.5–5.1)
Sodium: 137 mmol/L (ref 135–145)

## 2021-06-27 LAB — MAGNESIUM: Magnesium: 1.8 mg/dL (ref 1.7–2.4)

## 2021-06-27 LAB — PHOSPHORUS: Phosphorus: 3.5 mg/dL (ref 2.5–4.6)

## 2021-06-27 MED ORDER — GUAIFENESIN ER 600 MG PO TB12
1200.0000 mg | ORAL_TABLET | Freq: Two times a day (BID) | ORAL | Status: DC
  Administered 2021-06-27 – 2021-07-07 (×21): 1200 mg via ORAL
  Filled 2021-06-27 (×21): qty 2

## 2021-06-27 MED ORDER — FENTANYL CITRATE (PF) 100 MCG/2ML IJ SOLN
25.0000 ug | INTRAMUSCULAR | Status: DC | PRN
  Administered 2021-06-27 – 2021-06-28 (×4): 50 ug via INTRAVENOUS
  Administered 2021-06-28: 100 ug via INTRAVENOUS
  Filled 2021-06-27 (×5): qty 2

## 2021-06-27 MED ORDER — INSULIN GLARGINE-YFGN 100 UNIT/ML ~~LOC~~ SOLN
5.0000 [IU] | Freq: Every day | SUBCUTANEOUS | Status: DC
  Administered 2021-06-27 – 2021-06-28 (×2): 5 [IU] via SUBCUTANEOUS
  Filled 2021-06-27 (×2): qty 0.05

## 2021-06-27 MED ORDER — TIZANIDINE HCL 4 MG PO TABS
4.0000 mg | ORAL_TABLET | Freq: Four times a day (QID) | ORAL | Status: DC | PRN
  Administered 2021-06-27 – 2021-07-06 (×9): 4 mg via ORAL
  Filled 2021-06-27 (×10): qty 1

## 2021-06-27 MED ORDER — VANCOMYCIN HCL 1750 MG/350ML IV SOLN: 1750.0000 mg | INTRAVENOUS | Status: DC

## 2021-06-27 MED ORDER — DOCUSATE SODIUM 100 MG PO CAPS
100.0000 mg | ORAL_CAPSULE | Freq: Every day | ORAL | Status: DC
  Administered 2021-06-27 – 2021-07-03 (×7): 100 mg via ORAL
  Filled 2021-06-27 (×7): qty 1

## 2021-06-27 MED ORDER — SODIUM CHLORIDE 0.9 % IV SOLN
500.0000 mg | INTRAVENOUS | Status: AC
  Administered 2021-06-27 – 2021-06-28 (×2): 500 mg via INTRAVENOUS
  Filled 2021-06-27 (×2): qty 5

## 2021-06-27 MED ORDER — MAGNESIUM SULFATE 2 GM/50ML IV SOLN
2.0000 g | Freq: Once | INTRAVENOUS | Status: AC
  Administered 2021-06-27: 2 g via INTRAVENOUS
  Filled 2021-06-27: qty 50

## 2021-06-27 MED ORDER — SODIUM CHLORIDE 0.9 % IV SOLN
2.0000 g | INTRAVENOUS | Status: AC
  Administered 2021-06-27 – 2021-07-03 (×7): 2 g via INTRAVENOUS
  Filled 2021-06-27 (×7): qty 20

## 2021-06-27 MED ORDER — SODIUM CHLORIDE 0.9 % IV SOLN: INTRAVENOUS | Status: DC | PRN

## 2021-06-27 MED ORDER — FUROSEMIDE 10 MG/ML IJ SOLN
40.0000 mg | Freq: Four times a day (QID) | INTRAMUSCULAR | Status: AC
  Administered 2021-06-27 (×2): 40 mg via INTRAVENOUS
  Filled 2021-06-27 (×2): qty 4

## 2021-06-27 NOTE — Progress Notes (Signed)
NAME:  Gregory [name], MRN:  [id number], DOB:  Jan 04, 1961, LOS: 1 ADMISSION DATE:  06/26/2021, CONSULTATION DATE:  1/9 REFERRING MD:  Tegler, CHIEF COMPLAINT:  Dyspnea   History of Present Illness:  61 y/o male presented to [facility] ED with dyspnea, fever, cough.  Admitted for acute respiratory failure with hypoxemia requiring BIPAP.    Pertinent  Medical History  Diastolic heart failure Chronic normocytic anemia Chronic pain syndrome with chronic narcotic use COPD Degenerative disc disease DM2  Epidural abscess Diabetic ulcer of left foot S/p R AKA Epidural abscess GERD Hyperlipidemia Hypertension MRSA bacteremia SCC of the skin UTI History of osteomyelitis  Significant Hospital Events: Including procedures, antibiotic start and stop dates in addition to other pertinent events   1/9 admit 1/10 back on BIPAP due to work of breathing and hypoxemia, diuresed overnight, renal function a little worse Images 1/9 TTE> LVEF 50-55%, mild left LVH, RV systolic function is normal, can't assess PA pressure, aortic valve not well visualized, RA pressure estimate is 34mmHg  Micro 1/9 sars cov 2/flu > neg 1/9 urine culture >  1/9 RVP > neg 1/9 blood > neg  Abx 1/8 vanc > 1/10 1/8 cefepime > 1/10 1/10 ceftriaxone >  1/10 azithro >     Interim History / Subjective:  Off and on BIPAP overnight Now on high flow nasal cannula Diuresed some yesterday Complaining of pain in his R stump this morning Cr up slightly He says he feels swollen CXR worse  Objective   Blood pressure (!) 144/62, pulse 89, temperature 99.2 F (37.3 C), temperature source Axillary, resp. rate (!) 29, height 6' (1.829 m), weight 112.4 kg, SpO2 98 %.    Vent Mode: BIPAP FiO2 (%):  [60 %-100 %] 60 % Set Rate:  [15 bmp] 15 bmp PEEP:  [8 cmH20] 8 cmH20   Intake/Output Summary (Last 24 hours) at 06/27/2021 0802 Last data filed at 06/27/2021 0600 Gross per 24 hour  Intake 1643.97 ml  Output 2350 ml  Net -706.03  ml   Filed Weights   06/26/21 0051 06/26/21 0500 06/27/21 0500  Weight: 112.5 kg 111.9 kg 112.4 kg    Examination:  General:  Resting comfortably in bed HENT: NCAT OP clear PULM: Rhonchi R>L B, normal effort CV: RRR, no mgr GI: BS+, soft, nontender MSK: s/p R AKA Neuro: awake, alert, no distress, MAEW   Resolved Hospital Problem list     Assessment & Plan:  Acute respiratory failure with hypoxemia due to CAP History of COPD Wean off heated high flow for O2 saturation > 88% Continue abx but de-escalate to ceftriaxone/azithro Monitor cultures Send sputum culture Add flutter, guaifenesin, get out of bed Brovana/pulmicort  Acute decompensated Diastolic heart failure Hypertension Hyperlipidemia Demand ischemia, mild Monitor hemodynamics Tele Lasix again today, monitor IO and BMET  DM2 with hyperglycemia SSI, monitor carefully Add back glargine  CKD stage 3a, mildly worse on 1/10 Monitor BMET and UOP Replace electrolytes as needed  Chronic normocytic anemia: worsening without bleeding on 1/10 Monitor for bleeding Transfuse PRBC for Hgb < 7 gm/dL  Pressure injury skin POA Chronic leg wound Wound care consult  GERD PPI  BPH Flomax   Best Practice (right click and "Reselect all SmartList Selections" daily)   Diet/type: Regular consistency (see orders) DVT prophylaxis: prophylactic heparin  GI prophylaxis: PPI Lines: N/A Foley:  Yes, and it is still needed Code Status:  full code Last date of multidisciplinary goals of care discussion [1/10 called his daughter Joellen Jersey for  an update]  Labs   CBC: Recent Labs  Lab 06/26/21 0050 06/26/21 0203 06/27/21 0554  WBC 13.4*  --  10.6*  NEUTROABS 11.2*  --   --   HGB 8.8* 9.2* 7.7*  HCT 28.8* 27.0* 25.9*  MCV 92.9  --  94.2  PLT 240  --  423    Basic Metabolic Panel: Recent Labs  Lab 06/26/21 0050 06/26/21 0203 06/27/21 0554  NA 139 138 137  K 4.7 4.6 4.4  CL 106  --  104  CO2 25  --  23   GLUCOSE 195*  --  153*  BUN 33*  --  33*  CREATININE 2.28*  --  2.44*  CALCIUM 8.3*  --  8.1*  MG  --   --  1.8  PHOS  --   --  3.5   GFR: Estimated Creatinine Clearance: 40.6 mL/min (A) (by C-G formula based on SCr of 2.44 mg/dL (H)). Recent Labs  Lab 06/26/21 0038 06/26/21 0050 06/26/21 0748 06/27/21 0554  PROCALCITON  --   --  0.91  --   WBC  --  13.4*  --  10.6*  LATICACIDVEN 1.0  --   --   --     Liver Function Tests: Recent Labs  Lab 06/26/21 0050  AST 12*  ALT 10  ALKPHOS 84  BILITOT 0.7  PROT 7.2  ALBUMIN 3.0*   No results for input(s): LIPASE, AMYLASE in the last 168 hours. No results for input(s): AMMONIA in the last 168 hours.  ABG    Component Value Date/Time   PHART 7.381 06/26/2021 0203   PCO2ART 41.8 06/26/2021 0203   PO2ART 106 06/26/2021 0203   HCO3 24.8 06/26/2021 0203   TCO2 26 06/26/2021 0203   ACIDBASEDEF 4.0 (H) 11/08/2015 0329   O2SAT 98.0 06/26/2021 0203     Coagulation Profile: No results for input(s): INR, PROTIME in the last 168 hours.  Cardiac Enzymes: No results for input(s): CKTOTAL, CKMB, CKMBINDEX, TROPONINI in the last 168 hours.  HbA1C: Hemoglobin A1C  Date/Time Value Ref Range Status  08/14/2016 12:00 AM 5.6  Final  01/18/2016 12:00 AM 5.5  Final   Hgb A1c MFr Bld  Date/Time Value Ref Range Status  06/26/2021 07:48 AM 7.2 (H) 4.8 - 5.6 % Final    Comment:    (NOTE) Pre diabetes:          5.7%-6.4%  Diabetes:              >6.4%  Glycemic control for   <7.0% adults with diabetes   05/23/2019 06:09 AM 8.6 (H) 4.8 - 5.6 % Final    Comment:    (NOTE) Pre diabetes:          5.7%-6.4% Diabetes:              >6.4% Glycemic control for   <7.0% adults with diabetes     CBG: Recent Labs  Lab 06/26/21 1527 06/26/21 1941 06/26/21 2322 06/27/21 0335 06/27/21 0726  GLUCAP 174* 178* 152* 214* 150*    Critical care time: 35 minutes    Roselie Awkward, MD [place] [facility] Pager: [phone] Cell:  [phone] After 7:00 pm call Elink  [phone]

## 2021-06-27 NOTE — Progress Notes (Signed)
Pt placed back on BIPAP at this time due to Spo2 trending down and pt complaining of difficulty breathing. Pt does have increased RR and WOB. Complains of pain in his leg. Pt states "maybe for an hour. I don't know how long I can wear that mask".

## 2021-06-27 NOTE — Progress Notes (Signed)
Patient stated he did not want to wear the BiPAP tonight.  RT will continue with HFNCtreatment.  RT informed patient that if his WOB and O2 saturation was to decline that he will have to be placed on BiPAP; pt stated he understood. RT will continue to monitor.

## 2021-06-27 NOTE — Progress Notes (Signed)
Pharmacy Antibiotic Note  Gregory [name] is a 61 y.o. male admitted on 06/26/2021 with pneumonia.  CXR 1/9 has diffuse hazy opacification in the right lung. Afebrile. Renal function remains variable (1.90>2.28>2.44). Pharmacy has been consulted for Vancomycin dosing. 1/9 LD Vancomycin 2000mg  IV x1  Plan: Vancomycin 1750 mg IV Q48 (eAUC 471.9) goal AUC 400-600 Monitor for signs/symptoms of clinical improvement or worsening of infection and renal function. Order vanc levels at steady state.   Height: 6' (182.9 cm) Weight: 112.4 kg (247 lb 12.8 oz) IBW/kg (Calculated) : 77.6  Temp (24hrs), Avg:98.5 F (36.9 C), Min:96.3 F (35.7 C), Max:101 F (38.3 C)  Recent Labs  Lab 06/26/21 0038 06/26/21 0050 06/27/21 0554  WBC  --  13.4* 10.6*  CREATININE  --  2.28* 2.44*  LATICACIDVEN 1.0  --   --     Estimated Creatinine Clearance: 40.6 mL/min (A) (by C-G formula based on SCr of 2.44 mg/dL (H)).    No Known Allergies  Antimicrobials this admission: Vancomycin 2000mg  IV x1 Cefepime 2g IV Q12 >>    Microbiology results: 1/9 BCx: NG 24h 1/9 UCx: pend  1/9 MRSA PCR: Detected  Thank you for allowing pharmacy to be a part of this patients care.  Asher Muir, PharmD Candidate  Please check AMION for all [place] numbers After 10:00 PM, call main pharmacy [phone]  06/27/2021 8:48 AM

## 2021-06-28 DIAGNOSIS — J9601 Acute respiratory failure with hypoxia: Secondary | ICD-10-CM | POA: Diagnosis not present

## 2021-06-28 LAB — GLUCOSE, CAPILLARY
Glucose-Capillary: 128 mg/dL — ABNORMAL HIGH (ref 70–99)
Glucose-Capillary: 198 mg/dL — ABNORMAL HIGH (ref 70–99)
Glucose-Capillary: 200 mg/dL — ABNORMAL HIGH (ref 70–99)
Glucose-Capillary: 202 mg/dL — ABNORMAL HIGH (ref 70–99)
Glucose-Capillary: 202 mg/dL — ABNORMAL HIGH (ref 70–99)
Glucose-Capillary: 211 mg/dL — ABNORMAL HIGH (ref 70–99)

## 2021-06-28 LAB — CBC WITH DIFFERENTIAL/PLATELET
Abs Immature Granulocytes: 0.08 10*3/uL — ABNORMAL HIGH (ref 0.00–0.07)
Basophils Absolute: 0 10*3/uL (ref 0.0–0.1)
Basophils Relative: 1 %
Eosinophils Absolute: 0.4 10*3/uL (ref 0.0–0.5)
Eosinophils Relative: 4 %
HCT: 25.3 % — ABNORMAL LOW (ref 39.0–52.0)
Hemoglobin: 7.6 g/dL — ABNORMAL LOW (ref 13.0–17.0)
Immature Granulocytes: 1 %
Lymphocytes Relative: 18 %
Lymphs Abs: 1.6 10*3/uL (ref 0.7–4.0)
MCH: 27.8 pg (ref 26.0–34.0)
MCHC: 30 g/dL (ref 30.0–36.0)
MCV: 92.7 fL (ref 80.0–100.0)
Monocytes Absolute: 0.9 10*3/uL (ref 0.1–1.0)
Monocytes Relative: 11 %
Neutro Abs: 5.8 10*3/uL (ref 1.7–7.7)
Neutrophils Relative %: 65 %
Platelets: 186 10*3/uL (ref 150–400)
RBC: 2.73 MIL/uL — ABNORMAL LOW (ref 4.22–5.81)
RDW: 16 % — ABNORMAL HIGH (ref 11.5–15.5)
WBC: 8.8 10*3/uL (ref 4.0–10.5)
nRBC: 0 % (ref 0.0–0.2)

## 2021-06-28 LAB — COMPREHENSIVE METABOLIC PANEL
ALT: 12 U/L (ref 0–44)
AST: 19 U/L (ref 15–41)
Albumin: 2.2 g/dL — ABNORMAL LOW (ref 3.5–5.0)
Alkaline Phosphatase: 85 U/L (ref 38–126)
Anion gap: 11 (ref 5–15)
BUN: 33 mg/dL — ABNORMAL HIGH (ref 8–23)
CO2: 24 mmol/L (ref 22–32)
Calcium: 8.5 mg/dL — ABNORMAL LOW (ref 8.9–10.3)
Chloride: 104 mmol/L (ref 98–111)
Creatinine, Ser: 2.42 mg/dL — ABNORMAL HIGH (ref 0.61–1.24)
GFR, Estimated: 29 mL/min — ABNORMAL LOW (ref >60)
Glucose, Bld: 134 mg/dL — ABNORMAL HIGH (ref 70–99)
Potassium: 4.3 mmol/L (ref 3.5–5.1)
Sodium: 139 mmol/L (ref 135–145)
Total Bilirubin: 0.5 mg/dL (ref 0.3–1.2)
Total Protein: 6.3 g/dL — ABNORMAL LOW (ref 6.5–8.1)

## 2021-06-28 MED ORDER — OXYCODONE HCL 5 MG PO TABS
5.0000 mg | ORAL_TABLET | Freq: Three times a day (TID) | ORAL | Status: DC | PRN
  Filled 2021-06-28: qty 1

## 2021-06-28 MED ORDER — AZITHROMYCIN 500 MG PO TABS
500.0000 mg | ORAL_TABLET | Freq: Every day | ORAL | Status: AC
  Administered 2021-06-29 – 2021-07-01 (×3): 500 mg via ORAL
  Filled 2021-06-28 (×3): qty 1

## 2021-06-28 MED ORDER — FENTANYL CITRATE PF 50 MCG/ML IJ SOSY
25.0000 ug | PREFILLED_SYRINGE | INTRAMUSCULAR | Status: DC | PRN
  Administered 2021-06-29: 50 ug via INTRAVENOUS
  Administered 2021-06-29: 100 ug via INTRAVENOUS
  Administered 2021-06-29 – 2021-07-01 (×3): 50 ug via INTRAVENOUS
  Administered 2021-07-01: 100 ug via INTRAVENOUS
  Administered 2021-07-01 (×3): 50 ug via INTRAVENOUS
  Administered 2021-07-02 (×2): 100 ug via INTRAVENOUS
  Filled 2021-06-28: qty 2
  Filled 2021-06-28 (×2): qty 1
  Filled 2021-06-28 (×3): qty 2
  Filled 2021-06-28: qty 1
  Filled 2021-06-28: qty 2
  Filled 2021-06-28 (×3): qty 1
  Filled 2021-06-28: qty 2

## 2021-06-28 MED ORDER — GABAPENTIN 600 MG PO TABS
300.0000 mg | ORAL_TABLET | Freq: Three times a day (TID) | ORAL | Status: DC
  Administered 2021-06-28 – 2021-07-07 (×28): 300 mg via ORAL
  Filled 2021-06-28 (×6): qty 1
  Filled 2021-06-28: qty 0.5
  Filled 2021-06-28 (×20): qty 1
  Filled 2021-06-28 (×2): qty 0.5
  Filled 2021-06-28: qty 1

## 2021-06-28 MED ORDER — INSULIN GLARGINE-YFGN 100 UNIT/ML ~~LOC~~ SOLN
5.0000 [IU] | Freq: Once | SUBCUTANEOUS | Status: DC
  Filled 2021-06-28: qty 0.05

## 2021-06-28 MED ORDER — OXYCODONE HCL 5 MG PO TABS
30.0000 mg | ORAL_TABLET | Freq: Three times a day (TID) | ORAL | Status: DC
  Administered 2021-06-28 – 2021-07-02 (×12): 30 mg via ORAL
  Filled 2021-06-28 (×12): qty 6

## 2021-06-28 MED ORDER — FUROSEMIDE 10 MG/ML IJ SOLN
40.0000 mg | Freq: Once | INTRAMUSCULAR | Status: AC
Start: 2021-06-28 — End: 2021-06-28
  Administered 2021-06-28: 40 mg via INTRAVENOUS
  Filled 2021-06-28: qty 4

## 2021-06-28 MED ORDER — HYDROMORPHONE HCL 2 MG PO TABS
2.0000 mg | ORAL_TABLET | Freq: Four times a day (QID) | ORAL | Status: DC | PRN
  Administered 2021-06-28 – 2021-07-02 (×7): 2 mg via ORAL
  Filled 2021-06-28 (×7): qty 1

## 2021-06-28 MED ORDER — FENTANYL CITRATE (PF) 100 MCG/2ML IJ SOLN
25.0000 ug | INTRAMUSCULAR | Status: DC | PRN
  Administered 2021-06-28 (×2): 100 ug via INTRAVENOUS
  Filled 2021-06-28 (×2): qty 2

## 2021-06-28 MED ORDER — INSULIN GLARGINE-YFGN 100 UNIT/ML ~~LOC~~ SOLN
10.0000 [IU] | Freq: Every day | SUBCUTANEOUS | Status: DC
  Administered 2021-06-29 – 2021-07-07 (×8): 10 [IU] via SUBCUTANEOUS
  Filled 2021-06-28 (×10): qty 0.1

## 2021-06-28 NOTE — Plan of Care (Signed)

## 2021-06-28 NOTE — Progress Notes (Signed)
Patient arrived to unit; patient appears calm and free of pain. Patient oriented to unit. Patient assessed. Telemonitoring connections connected to patient. Agricultural consultant notified. Report to be given to oncoming RN; will continue to monitor

## 2021-06-28 NOTE — Progress Notes (Signed)
NAME:  Gregory [name], MRN:  [id number], DOB:  1960-12-20, LOS: 2 ADMISSION DATE:  06/26/2021, CONSULTATION DATE:  1/9 REFERRING MD:  Tegler, CHIEF COMPLAINT:  Dyspnea   History of Present Illness:  61 y/o male presented to [facility] ED with dyspnea, fever, cough.  Admitted for acute respiratory failure with hypoxemia requiring BIPAP.    Pertinent  Medical History  Diastolic heart failure Chronic normocytic anemia Chronic pain syndrome with chronic narcotic use COPD Degenerative disc disease DM2  Epidural abscess Diabetic ulcer of left foot S/p R AKA Epidural abscess GERD Hyperlipidemia Hypertension MRSA bacteremia SCC of the skin UTI History of osteomyelitis  Significant Hospital Events: Including procedures, antibiotic start and stop dates in addition to other pertinent events   1/9 admit 1/10 back on BIPAP due to work of breathing and hypoxemia, diuresed overnight, renal function a little worse Images 1/9 TTE> LVEF 50-55%, mild left LVH, RV systolic function is normal, can't assess PA pressure, aortic valve not well visualized, RA pressure estimate is 26mmHg  Micro 1/9 sars cov 2/flu > neg 1/9 urine culture > Klebsiella  1/9 RVP > neg 1/9 blood > neg  Abx 1/8 vanc > 1/10 1/8 cefepime > 1/10 1/10 ceftriaxone >  1/10 azithro >    Interim History / Subjective:   Stump still hurts Oxygenation improving Coughing up a lot of mucus  Objective   Blood pressure (!) 135/106, pulse 84, temperature 98.9 F (37.2 C), temperature source Oral, resp. rate (!) 26, height 6' (1.829 m), weight 112.4 kg, SpO2 91 %.    FiO2 (%):  [65 %-80 %] 65 %   Intake/Output Summary (Last 24 hours) at 06/28/2021 0800 Last data filed at 06/28/2021 0600 Gross per 24 hour  Intake 1895.97 ml  Output 4100 ml  Net -2204.03 ml   Filed Weights   06/26/21 0500 06/27/21 0500 06/28/21 0500  Weight: 111.9 kg 112.4 kg 112.4 kg    Examination:  General:  Chronically ill appearing, resting  comfortably in bed HENT: NCAT OP clear PULM: Rhonchi bilaterally B, normal effort CV: RRR, no mgr GI: BS+, soft, nontender MSK: s/p AKA on R Neuro: awake, alert, no distress, MAEW    Resolved Hospital Problem list     Assessment & Plan:  Acute respiratory failure with hypoxemia due to CAP History of COPD Wean off HHF O2 for O2 saturation > 88% Continue ceftriaxone/azithro Send sputum culture Continue flutter, guaifenesin Up ad lib Brovana/pulmicort  Acute decompensated Diastolic heart failure Hypertension Hyperlipidemia Demand ischemia, mild Monitor hemodynamics Tele Lasix x1 today  DM2 with hyperglycemia SSI, monitor glucose Increase glargine to 10 U qHS  CKD stage 3a, stable 1/10 Monitor BMET and UOP Replace electrolytes as needed  Chronic normocytic anemia: worsening without bleeding on 1/10 Monitor for bleeding Transfuse PRBC for Hgb < 7 gm/dL  Chronic pain Symbalta Lyrica Change oxycodone to home dosing 30mg  po q8h + 5mg  po prn pain  Pressure injury skin POA Chronic leg wound Wound care as per WOC consult recommendations  GERD PPI  BPH Flomax  Will send out of ICU to [facility], progressive care today  Best Practice (right click and "Reselect all SmartList Selections" daily)   Diet/type: Regular consistency (see orders) DVT prophylaxis: prophylactic heparin  GI prophylaxis: PPI Lines: N/A Foley:  Yes, and it is still needed Code Status:  full code Last date of multidisciplinary goals of care discussion [1/11 called his daughter Joellen Jersey for an update]  Labs   CBC: Recent Labs  Lab  06/26/21 0050 06/26/21 0203 06/27/21 0554 06/28/21 0107  WBC 13.4*  --  10.6* 8.8  NEUTROABS 11.2*  --   --  5.8  HGB 8.8* 9.2* 7.7* 7.6*  HCT 28.8* 27.0* 25.9* 25.3*  MCV 92.9  --  94.2 92.7  PLT 240  --  199 941    Basic Metabolic Panel: Recent Labs  Lab 06/26/21 0050 06/26/21 0203 06/27/21 0554 06/28/21 0107  NA 139 138 137 139  K 4.7 4.6 4.4 4.3   CL 106  --  104 104  CO2 25  --  23 24  GLUCOSE 195*  --  153* 134*  BUN 33*  --  33* 33*  CREATININE 2.28*  --  2.44* 2.42*  CALCIUM 8.3*  --  8.1* 8.5*  MG  --   --  1.8  --   PHOS  --   --  3.5  --    GFR: Estimated Creatinine Clearance: 41 mL/min (A) (by C-G formula based on SCr of 2.42 mg/dL (H)). Recent Labs  Lab 06/26/21 0038 06/26/21 0050 06/26/21 0748 06/27/21 0554 06/28/21 0107  PROCALCITON  --   --  0.91  --   --   WBC  --  13.4*  --  10.6* 8.8  LATICACIDVEN 1.0  --   --   --   --     Liver Function Tests: Recent Labs  Lab 06/26/21 0050 06/28/21 0107  AST 12* 19  ALT 10 12  ALKPHOS 84 85  BILITOT 0.7 0.5  PROT 7.2 6.3*  ALBUMIN 3.0* 2.2*   No results for input(s): LIPASE, AMYLASE in the last 168 hours. No results for input(s): AMMONIA in the last 168 hours.  ABG    Component Value Date/Time   PHART 7.381 06/26/2021 0203   PCO2ART 41.8 06/26/2021 0203   PO2ART 106 06/26/2021 0203   HCO3 24.8 06/26/2021 0203   TCO2 26 06/26/2021 0203   ACIDBASEDEF 4.0 (H) 11/08/2015 0329   O2SAT 98.0 06/26/2021 0203     Coagulation Profile: No results for input(s): INR, PROTIME in the last 168 hours.  Cardiac Enzymes: No results for input(s): CKTOTAL, CKMB, CKMBINDEX, TROPONINI in the last 168 hours.  HbA1C: Hemoglobin A1C  Date/Time Value Ref Range Status  08/14/2016 12:00 AM 5.6  Final  01/18/2016 12:00 AM 5.5  Final   Hgb A1c MFr Bld  Date/Time Value Ref Range Status  06/26/2021 07:48 AM 7.2 (H) 4.8 - 5.6 % Final    Comment:    (NOTE) Pre diabetes:          5.7%-6.4%  Diabetes:              >6.4%  Glycemic control for   <7.0% adults with diabetes   05/23/2019 06:09 AM 8.6 (H) 4.8 - 5.6 % Final    Comment:    (NOTE) Pre diabetes:          5.7%-6.4% Diabetes:              >6.4% Glycemic control for   <7.0% adults with diabetes     CBG: Recent Labs  Lab 06/27/21 1510 06/27/21 1920 06/27/21 2329 06/28/21 0335 06/28/21 0738  GLUCAP  284* 204* 195* 128* 202*    Critical care time: n/a minutes    Roselie Awkward, MD [place] [facility] Pager: [phone] Cell: [phone] After 7:00 pm call Elink  [phone]

## 2021-06-28 NOTE — Progress Notes (Signed)
No bipap needed at this time. VS stable no resp. Dsstress noted at this time. RT will continue to monitor.

## 2021-06-29 DIAGNOSIS — I5032 Chronic diastolic (congestive) heart failure: Secondary | ICD-10-CM

## 2021-06-29 DIAGNOSIS — D649 Anemia, unspecified: Secondary | ICD-10-CM | POA: Diagnosis present

## 2021-06-29 DIAGNOSIS — J9601 Acute respiratory failure with hypoxia: Secondary | ICD-10-CM | POA: Diagnosis not present

## 2021-06-29 DIAGNOSIS — G894 Chronic pain syndrome: Secondary | ICD-10-CM | POA: Diagnosis not present

## 2021-06-29 DIAGNOSIS — J189 Pneumonia, unspecified organism: Secondary | ICD-10-CM | POA: Diagnosis not present

## 2021-06-29 LAB — CARBAPENEM RESISTANCE PANEL
Carba Resistance IMP Gene: NOT DETECTED
Carba Resistance KPC Gene: NOT DETECTED
Carba Resistance NDM Gene: NOT DETECTED
Carba Resistance OXA48 Gene: NOT DETECTED
Carba Resistance VIM Gene: NOT DETECTED

## 2021-06-29 LAB — COMPREHENSIVE METABOLIC PANEL
ALT: 23 U/L (ref 0–44)
AST: 27 U/L (ref 15–41)
Albumin: 2.1 g/dL — ABNORMAL LOW (ref 3.5–5.0)
Alkaline Phosphatase: 133 U/L — ABNORMAL HIGH (ref 38–126)
Anion gap: 9 (ref 5–15)
BUN: 34 mg/dL — ABNORMAL HIGH (ref 8–23)
CO2: 25 mmol/L (ref 22–32)
Calcium: 8.3 mg/dL — ABNORMAL LOW (ref 8.9–10.3)
Chloride: 102 mmol/L (ref 98–111)
Creatinine, Ser: 2.5 mg/dL — ABNORMAL HIGH (ref 0.61–1.24)
GFR, Estimated: 28 mL/min — ABNORMAL LOW (ref >60)
Glucose, Bld: 164 mg/dL — ABNORMAL HIGH (ref 70–99)
Potassium: 3.9 mmol/L (ref 3.5–5.1)
Sodium: 136 mmol/L (ref 135–145)
Total Bilirubin: 0.2 mg/dL — ABNORMAL LOW (ref 0.3–1.2)
Total Protein: 6.1 g/dL — ABNORMAL LOW (ref 6.5–8.1)

## 2021-06-29 LAB — GLUCOSE, CAPILLARY
Glucose-Capillary: 130 mg/dL — ABNORMAL HIGH (ref 70–99)
Glucose-Capillary: 139 mg/dL — ABNORMAL HIGH (ref 70–99)
Glucose-Capillary: 140 mg/dL — ABNORMAL HIGH (ref 70–99)
Glucose-Capillary: 150 mg/dL — ABNORMAL HIGH (ref 70–99)
Glucose-Capillary: 152 mg/dL — ABNORMAL HIGH (ref 70–99)
Glucose-Capillary: 174 mg/dL — ABNORMAL HIGH (ref 70–99)

## 2021-06-29 LAB — URINE CULTURE: Culture: >=100000 — AB

## 2021-06-29 MED ORDER — CALCIUM CARBONATE ANTACID 500 MG PO CHEW
1.0000 | CHEWABLE_TABLET | Freq: Three times a day (TID) | ORAL | Status: DC | PRN
  Administered 2021-06-29 – 2021-07-07 (×2): 200 mg via ORAL
  Filled 2021-06-29 (×2): qty 1

## 2021-06-29 MED ORDER — UMECLIDINIUM BROMIDE 62.5 MCG/ACT IN AEPB
1.0000 | INHALATION_SPRAY | Freq: Every day | RESPIRATORY_TRACT | Status: DC
  Administered 2021-06-30 – 2021-07-07 (×7): 1 via RESPIRATORY_TRACT
  Filled 2021-06-29 (×2): qty 7

## 2021-06-29 MED ORDER — TORSEMIDE 20 MG PO TABS: 40.0000 mg | ORAL_TABLET | Freq: Every day | ORAL | Status: DC

## 2021-06-29 MED ORDER — ROSUVASTATIN CALCIUM 5 MG PO TABS
10.0000 mg | ORAL_TABLET | Freq: Every day | ORAL | Status: DC
  Administered 2021-06-29 – 2021-07-06 (×8): 10 mg via ORAL
  Filled 2021-06-29 (×8): qty 2

## 2021-06-29 MED ORDER — TORSEMIDE 20 MG PO TABS
20.0000 mg | ORAL_TABLET | Freq: Every day | ORAL | Status: DC
  Administered 2021-06-29 – 2021-07-01 (×3): 20 mg via ORAL
  Filled 2021-06-29 (×3): qty 1

## 2021-06-29 MED ORDER — FAMOTIDINE 20 MG PO TABS: 20.0000 mg | ORAL_TABLET | Freq: Once | ORAL | Status: DC

## 2021-06-29 NOTE — Assessment & Plan Note (Signed)
CBG stable-continue Semglee 10 units daily and SSI, oral hypoglycemic agents on hold  CBG (last 3)  Recent Labs    06/28/21 1940 06/28/21 2344 06/29/21 0342  GLUCAP 198* 211* 139*

## 2021-06-29 NOTE — Assessment & Plan Note (Signed)
Monitor stump site.

## 2021-06-29 NOTE — Progress Notes (Signed)
°  Transition of Care [facility]) Screening Note   Patient Details  Name: Gregory [name] Date of Birth: 03/13/1961   Transition of Care [facility]) CM/SW Contact:    Benard Halsted, LCSW Phone Number: 06/29/2021, 8:58 AM    Transition of Care Department [facility]) has reviewed patient. Patient resides at [facility]) under long term care. We will continue to monitor patient advancement through interdisciplinary progression rounds.

## 2021-06-29 NOTE — Assessment & Plan Note (Signed)
Continue transdermal nicotine.

## 2021-06-29 NOTE — Assessment & Plan Note (Signed)
On diuretics.

## 2021-06-29 NOTE — Assessment & Plan Note (Signed)
Probably related to CKD-worsened with acute illness.  Watch closely, and transfuse if Hb <7.

## 2021-06-29 NOTE — Assessment & Plan Note (Addendum)
Continue Neurontin/Cymbalta/Lyrica and oxycodone.  Oxycodone dose increased, fentanyl patch applied upon patient's request, I am concerned about accidental narcotic overdose, I have counseled the patient not to use more narcotics than needed.  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  Patient has been extensively counseled.  He continues to refuse lab work and CT scan despite being counseled multiple times, he is comfortable at all times but still calling staff every 15 to 20 minutes and asking for more narcotics.  He has narcotic bowel full of stool and I am highly concerned about an accidental overdose, ongoing microaspiration due to narcotic bowel and nausea vomiting.  I have expressed my concerns clearly to patient and daughter, patient has been warned that this kind of behavior can lead to accidental overdose, narcotic bowel, continued nausea vomiting, worsening of pneumonia and even death.

## 2021-06-29 NOTE — Assessment & Plan Note (Addendum)
Continue Crestor 

## 2021-06-29 NOTE — Plan of Care (Signed)

## 2021-06-29 NOTE — Assessment & Plan Note (Signed)
BP stable-off all antihypertensives-resume when able.

## 2021-06-29 NOTE — Progress Notes (Addendum)
RN at bedside. Patient appears calm but complains of breakthrough pain. Sublimase 34mcg given per PRN order. Patient educated on Foley removal. Patient understood education; all questions answered by RN. Foley catheter removed per MD order. Patient tolerated well. Will continue to monitor

## 2021-06-29 NOTE — Evaluation (Signed)
Occupational Therapy Evaluation Patient Details Name: Gregory [name] MRN: [id number] DOB: 12-10-1960 Today's Date: 06/29/2021   History of Present Illness 61 y.o. male, who presented to the [facility] EDfrom SNF with a chief complaint of SOB, fever, and productive cough. Pt with repiratory failure with hypoxemia with SpO2 in the 60s on arrival. Started on NRB, then BiPap. PMH:CHF, HTN, HLD, COPD, osteomyelitis, RT AKA, chronic L leg wounds, L toe amputations, CKD3, DM2   Clinical Impression   PTA pt has lived at [facility] (SNF) for 5 years after his R AKA. At baseline, pt is modified independent @ scooter [employer]) level with ADL and mobility. Pt will ride his scooter to the store. Pt seen on HHFNC 25 L; FiO270%. Probe moved to R ear and SpO2 96 during bed level activity; other VSS. Pt states he is feeling better but feels generally "weak". Acute OT to follow however recommend pt receive rehab services once he returns to SNF to facilitate return to baseline. Pt very appreciative.      Recommendations for follow up therapy are one component of a multi-disciplinary discharge planning process, led by the attending physician.  Recommendations may be updated based on patient status, additional functional criteria and insurance authorization.   Follow Up Recommendations  Skilled nursing-short term rehab (<3 hours/day)    Assistance Recommended at Discharge Intermittent Supervision/Assistance  Patient can return home with the following  (NA)    Functional Status Assessment  Patient has had a recent decline in their functional status and demonstrates the ability to make significant improvements in function in a reasonable and predictable amount of time.  Equipment Recommendations  None recommended by OT    Recommendations for Other Services PT consult     Precautions / Restrictions Precautions Precautions: Fall Precaution Comments: sacral ouwnd; LLE wound Required Braces or Orthoses: Other Brace  (ordered Prevalon boot LLE)      Mobility Bed Mobility Overal bed mobility: Needs Assistance Bed Mobility: Supine to Sit     Supine to sit: Supervision     General bed mobility comments: Pt bridging with LLE to move up in bed and reposition    Transfers                   General transfer comment: not completed this date due to fatigue adn pt requesting to stay in bed to eat his breakfast      Balance Overall balance assessment: Needs assistance   Sitting balance-Leahy Scale: Fair                                     ADL either performed or assessed with clinical judgement   ADL Overall ADL's : Needs assistance/impaired     Grooming: Set up   Upper Body Bathing: Set up   Lower Body Bathing: Minimal assistance;Bed level   Upper Body Dressing : Set up   Lower Body Dressing: Minimal assistance;Bed level               Functional mobility during ADLs: Min guard (for bed mobility; long sitting; able to move aroun din bed to EOB using bed controls and rails)       Vision Baseline Vision/History: 2 Legally blind (L eye)       Perception     Praxis      Pertinent Vitals/Pain Pain Assessment: No/denies pain     Hand Dominance Right  Extremity/Trunk Assessment Upper Extremity Assessment Upper Extremity Assessment: Generalized weakness       Cervical / Trunk Assessment Cervical / Trunk Assessment: Other exceptions (increased body habitus)   Communication Communication Communication: No difficulties   Cognition Arousal/Alertness: Awake/alert Behavior During Therapy: WFL for tasks assessed/performed Overall Cognitive Status: Within Functional Limits for tasks assessed                                       General Comments  muliptle wounds; on Heated HFNC    Exercises Exercises: Other exercises Other Exercises Other Exercises: encouraged general AROM BUE and LLE   Shoulder Instructions      Home  Living Family/patient expects to be discharged to:: Skilled nursing facility                                        Prior Functioning/Environment Prior Level of Function : Independent/Modified Independent               ADLs Comments: At facility is ablet o independnetly transfer to his scooter/toilet using a squat pivot/lateral scoot. Able to complete basic ADL at baseline; takes his scooter to the store.        OT Problem List: Decreased strength;Decreased activity tolerance;Decreased safety awareness;Decreased knowledge of use of DME or AE      OT Treatment/Interventions: Self-care/ADL training;Therapeutic exercise;Energy conservation;DME and/or AE instruction;Therapeutic activities;Patient/family education    OT Goals(Current goals can be found in the care plan section) Acute Rehab OT Goals Patient Stated Goal: to return to baseline OT Goal Formulation: With patient Time For Goal Achievement: 07/13/21 Potential to Achieve Goals: Good  OT Frequency: Min 2X/week    Co-evaluation              AM-PAC OT "6 Clicks" Daily Activity     Outcome Measure Help from another person eating meals?: None Help from another person taking care of personal grooming?: A Little Help from another person toileting, which includes using toliet, bedpan, or urinal?: A Little Help from another person bathing (including washing, rinsing, drying)?: A Little Help from another person to put on and taking off regular upper body clothing?: A Little Help from another person to put on and taking off regular lower body clothing?: A Little 6 Click Score: 19   End of Session Equipment Utilized During Treatment: Oxygen (25L HHFNC; 70% FiO2) Nurse Communication: Mobility status;Other (comment) (recommend air mattress and L Prevalon boot)  Activity Tolerance: Patient tolerated treatment well Patient left: in bed;with call bell/phone within reach;with bed alarm set  OT Visit Diagnosis:  Muscle weakness (generalized) (M62.81)                Time: 2426-8341 OT Time Calculation (min): 21 min Charges:  OT General Charges $OT Visit: 1 Visit OT Evaluation $OT Eval Moderate Complexity: [place], OT/L   Acute OT Clinical Specialist [place] Pager [phone] Office [phone]   [facility] 06/29/2021, 10:02 AM

## 2021-06-29 NOTE — Assessment & Plan Note (Addendum)
Creatinine not far from usual baseline-continue to closely monitor while on diuretics.  We will allow for some mild elevation in creatinine that his usual baseline in an attempt to keep patient on the drier side.

## 2021-06-29 NOTE — Assessment & Plan Note (Addendum)
Do not think he has decompensated heart failure at this point-has lymphedema at baseline-remains on scheduled Demadex-continue to maintain negative balance.  Echo on 1/9 with preserved EF.

## 2021-06-29 NOTE — Assessment & Plan Note (Signed)
Not in exacerbation-continue bronchodilators.

## 2021-06-29 NOTE — Assessment & Plan Note (Signed)
Wound care team following

## 2021-06-29 NOTE — Assessment & Plan Note (Addendum)
Due to aspiration pneumonia versus community-acquired pneumonia.  He has finished azithromycin treatment, stop Rocephin after 07/05/2021,, speech therapy following, still against excessive narcotic use which could be causing narcotic bowel, nausea vomiting and aspiration.  Also in fluid overload which is improving with diuretics.  CT noncontrast chest noted.  Clinically improved from heated high flow 40 L down to 6 L.  We will continue to titrate his oxygen down.  D-dimer has come back borderline elevated, lower extremity venous duplex unremarkable.  Cannot do CTA due to renal dysfunction.  Echocardiogram noted with minimally elevated RV pressures.  VQ scan ordered but patient refused twice despite extensive counseling first on 07/05/2021 then again on 07/06/2021.  He is zooms all responsibility for missing a PE with resulting death or disability.  Nurse for the patient [place] in the room during this conversation kindly see documentation by him.

## 2021-06-29 NOTE — Progress Notes (Signed)
PROGRESS NOTE        PATIENT DETAILS Name: Gregory [name] Age: 61 y.o. Sex: male Date of Birth: [birth year] Admit Date: 06/26/2021 Admitting Physician Aldean Jewett, MD JYN:[id number], Brandt Loosen, MD  Brief Narrative:  Patient is a 61 y.o. male with history of DM-2, HTN, chronic HFpEF, s/p right AKA, COPD-who was admitted to [facility] service on 1/9 with acute hypoxic respiratory failure due to community-acquired pneumonia.  Significant events: 1/9>> admit to ICU by PCCM-initially requiring BiPAP. 1/12>> transfer to [facility] on heated high flow  Subjective: Lying comfortably in bed-denies any chest pain or shortness of breath.  Remains on heated high flow but appears comfortable.  Objective: Vitals: Blood pressure (!) 148/66, pulse 74, temperature 97.7 F (36.5 C), temperature source Oral, resp. rate (!) 21, height 6' (1.829 m), weight 112.9 kg, SpO2 92 %.   Exam: Gen Exam:Alert awake-not in any distress HEENT:atraumatic, normocephalic Chest: B/L clear to auscultation anteriorly CVS:S1S2 regular Abdomen:soft non tender, non distended Extremities: S/p right AKA-left leg with chronic venous stasis changes and lymphedema changes. Neurology: Non focal Skin: no rash  Pertinent Labs/Radiology: Recent Labs  Lab 06/26/21 0050 06/26/21 0203 06/28/21 0107 06/29/21 0140  WBC 13.4*   < > 8.8  --   HGB 8.8*   < > 7.6*  --   PLT 240   < > 186  --   NA 139   < > 139 136  K 4.7   < > 4.3 3.9  CREATININE 2.28*   < > 2.42* 2.50*  AST 12*  --  19 27  ALT 10  --  12 23  ALKPHOS 84  --  85 133*  BILITOT 0.7  --  0.5 0.2*   < > = values in this interval not displayed.    Assessment/Plan: Acute respiratory failure with hypoxia ([place])- (present on admission) Due to community-acquired pneumonia-on antibiotics/bronchodilators-all cultures negative to date.  Respiratory virus panel/COVID/influenza PCR negative.  Plan is to wean off oxygen as tolerated.  Chronic diastolic CHF  (congestive heart failure) ([place])- (present on admission) Do not think he has decompensated heart failure at this point-has lymphedema at baseline-we will place on scheduled Demadex-maintain a negative balance-and watch closely.  COPD (chronic obstructive pulmonary disease) ([place])- (present on admission) Not in exacerbation-continue bronchodilators.  HTN (hypertension)- (present on admission) BP stable-off all antihypertensives-resume when able.  HLD (hyperlipidemia)- (present on admission) Resume Crestor   Normocytic anemia- (present on admission) Probably related to CKD-worsened with acute illness.  Watch closely, and transfuse if Hb <7.  Stage 3b chronic kidney disease (CKD) ([place])- (present on admission) Creatinine close to baseline-watch closely.  Should be okay to discontinue Foley catheter today-and watch closely.  Chronic pain- (present on admission) Continue Neurontin/Cymbalta/Lyrica and oxycodone.  S/P AKA (above knee amputation) unilateral, right ([place]) Monitor stump site.  Type II diabetes mellitus with neurological manifestations ([place])- (present on admission) CBG stable-continue Semglee 10 units daily and SSI, oral hypoglycemic agents on hold  CBG (last 3)  Recent Labs    06/28/21 1940 06/28/21 2344 06/29/21 0342  GLUCAP 198* 211* 139*    Tobacco use- (present on admission) Continue transdermal nicotine.  Lymphedema of left leg On diuretics.  Pressure injury, stage 3 ([place])- (present on admission) Wound care team following  Nutrition Status: Nutrition Problem: Increased nutrient needs Etiology: wound healing Signs/Symptoms: estimated needs Interventions:  MVI, Prostat, Premier Protein  Obesity: Estimated body mass index is 33.76 kg/m as calculated from the following:   Height as of this encounter: 6' (1.829 m).   Weight as of this encounter: 112.9 kg.    Procedures: None Consults: PCCM DVT Prophylaxis: SQ Heparin Code Status:Full code  Family  Communication: None at bedside   Disposition Plan: Status is: Inpatient  Remains inpatient appropriate because: Severe hypoxia due to CAP-on heated high flow-SNF when more stable.    Diet: Diet Order             Diet Carb Modified Fluid consistency: Thin; Room service appropriate? Yes  Diet effective now                     Antimicrobial agents: Anti-infectives (From admission, onward)    Start     Dose/Rate Route Frequency Ordered Stop   06/29/21 1000  azithromycin (ZITHROMAX) tablet 500 mg        500 mg Oral Daily 06/28/21 0959 07/02/21 0959   06/28/21 0600  vancomycin (VANCOREADY) IVPB 1750 mg/350 mL  Status:  Discontinued        1,750 mg 175 mL/hr over 120 Minutes Intravenous Every 48 hours 06/27/21 0842 06/27/21 0924   06/27/21 1000  cefTRIAXone (ROCEPHIN) 2 g in sodium chloride 0.9 % 100 mL IVPB        2 g 200 mL/hr over 30 Minutes Intravenous Every 24 hours 06/27/21 0924 07/01/21 2359   06/27/21 1000  azithromycin (ZITHROMAX) 500 mg in sodium chloride 0.9 % 250 mL IVPB        500 mg 250 mL/hr over 60 Minutes Intravenous Every 24 hours 06/27/21 0924 06/28/21 1148   06/26/21 1600  ceFEPIme (MAXIPIME) 2 g in sodium chloride 0.9 % 100 mL IVPB  Status:  Discontinued        2 g 200 mL/hr over 30 Minutes Intravenous Every 12 hours 06/26/21 0438 06/27/21 0924   06/26/21 0438  vancomycin variable dose per unstable renal function (pharmacist dosing)  Status:  Discontinued         Does not apply See admin instructions 06/26/21 0438 06/27/21 0848   06/26/21 0430  vancomycin (VANCOREADY) IVPB 2000 mg/400 mL        2,000 mg 200 mL/hr over 120 Minutes Intravenous  Once 06/26/21 0418 06/26/21 0800   06/26/21 0430  ceFEPIme (MAXIPIME) 2 g in sodium chloride 0.9 % 100 mL IVPB        2 g 200 mL/hr over 30 Minutes Intravenous  Once 06/26/21 0418 06/26/21 0541   06/26/21 0200  cefTRIAXone (ROCEPHIN) 1 g in sodium chloride 0.9 % 100 mL IVPB        1 g 200 mL/hr over 30 Minutes  Intravenous  Once 06/26/21 0154 06/26/21 0226   06/26/21 0200  azithromycin (ZITHROMAX) 500 mg in sodium chloride 0.9 % 250 mL IVPB        500 mg 250 mL/hr over 60 Minutes Intravenous  Once 06/26/21 0154 06/26/21 0349        MEDICATIONS: Scheduled Meds:  (feeding supplement) PROSource Plus  30 mL Oral Daily   arformoterol  15 mcg Nebulization BID   azithromycin  500 mg Oral Daily   budesonide (PULMICORT) nebulizer solution  0.5 mg Nebulization BID   buPROPion  300 mg Oral Daily   chlorhexidine  15 mL Mouth Rinse BID   Chlorhexidine Gluconate Cloth  6 each Topical Daily   docusate sodium  100 mg Oral  Daily   DULoxetine  60 mg Oral Daily   gabapentin  300 mg Oral Q8H   guaiFENesin  1,200 mg Oral BID   heparin  5,000 Units Subcutaneous Q8H   insulin aspart  0-20 Units Subcutaneous Q4H   insulin glargine-yfgn  10 Units Subcutaneous Daily   mouth rinse  15 mL Mouth Rinse q12n4p   multivitamin  1 tablet Oral QHS   mupirocin ointment  1 application Nasal BID   nicotine  21 mg Transdermal Daily   oxyCODONE  30 mg Oral Q8H   pantoprazole  40 mg Oral Daily   pregabalin  150 mg Oral BID   Ensure Max Protein  11 oz Oral Daily   rosuvastatin  10 mg Oral QHS   tamsulosin  0.4 mg Oral QPM   torsemide  20 mg Oral Daily   umeclidinium bromide  1 puff Inhalation Daily   Continuous Infusions:  sodium chloride Stopped (06/28/21 1202)   cefTRIAXone (ROCEPHIN)  IV 2 g (06/29/21 0936)   PRN Meds:.sodium chloride, acetaminophen, albuterol, ALPRAZolam, docusate sodium, eszopiclone, fentaNYL (SUBLIMAZE) injection, HYDROmorphone, oxymetazoline, polyethylene glycol, tiZANidine   I have personally reviewed following labs and imaging studies  LABORATORY DATA: CBC: Recent Labs  Lab 06/26/21 0050 06/26/21 0203 06/27/21 0554 06/28/21 0107  WBC 13.4*  --  10.6* 8.8  NEUTROABS 11.2*  --   --  5.8  HGB 8.8* 9.2* 7.7* 7.6*  HCT 28.8* 27.0* 25.9* 25.3*  MCV 92.9  --  94.2 92.7  PLT 240  --  199  283    Basic Metabolic Panel: Recent Labs  Lab 06/26/21 0050 06/26/21 0203 06/27/21 0554 06/28/21 0107 06/29/21 0140  NA 139 138 137 139 136  K 4.7 4.6 4.4 4.3 3.9  CL 106  --  104 104 102  CO2 25  --  23 24 25   GLUCOSE 195*  --  153* 134* 164*  BUN 33*  --  33* 33* 34*  CREATININE 2.28*  --  2.44* 2.42* 2.50*  CALCIUM 8.3*  --  8.1* 8.5* 8.3*  MG  --   --  1.8  --   --   PHOS  --   --  3.5  --   --     GFR: Estimated Creatinine Clearance: 39.7 mL/min (A) (by C-G formula based on SCr of 2.5 mg/dL (H)).  Liver Function Tests: Recent Labs  Lab 06/26/21 0050 06/28/21 0107 06/29/21 0140  AST 12* 19 27  ALT 10 12 23   ALKPHOS 84 85 133*  BILITOT 0.7 0.5 0.2*  PROT 7.2 6.3* 6.1*  ALBUMIN 3.0* 2.2* 2.1*   No results for input(s): LIPASE, AMYLASE in the last 168 hours. No results for input(s): AMMONIA in the last 168 hours.  Coagulation Profile: No results for input(s): INR, PROTIME in the last 168 hours.  Cardiac Enzymes: No results for input(s): CKTOTAL, CKMB, CKMBINDEX, TROPONINI in the last 168 hours.  BNP (last 3 results) No results for input(s): PROBNP in the last 8760 hours.  Lipid Profile: No results for input(s): CHOL, HDL, LDLCALC, TRIG, CHOLHDL, LDLDIRECT in the last 72 hours.  Thyroid Function Tests: No results for input(s): TSH, T4TOTAL, FREET4, T3FREE, THYROIDAB in the last 72 hours.  Anemia Panel: No results for input(s): VITAMINB12, FOLATE, FERRITIN, TIBC, IRON, RETICCTPCT in the last 72 hours.  Urine analysis:    Component Value Date/Time   COLORURINE YELLOW 06/26/2021 [place] 06/26/2021 1234   LABSPEC 1.015 06/26/2021 1234   PHURINE  6.0 06/26/2021 1234   GLUCOSEU NEGATIVE 06/26/2021 1234   HGBUR TRACE (A) 06/26/2021 1234   BILIRUBINUR NEGATIVE 06/26/2021 1234   KETONESUR NEGATIVE 06/26/2021 1234   PROTEINUR 100 (A) 06/26/2021 1234   UROBILINOGEN 1.0 02/03/2014 0052   NITRITE POSITIVE (A) 06/26/2021 1234    LEUKOCYTESUR TRACE (A) 06/26/2021 1234    Sepsis Labs: Lactic Acid, Venous    Component Value Date/Time   LATICACIDVEN 1.0 06/26/2021 0038    MICROBIOLOGY: Recent Results (from the past 240 hour(s))  Resp Panel by RT-PCR (Flu A&B, Covid)     Status: None   Collection Time: 06/26/21 12:38 AM   Specimen: Nasopharyngeal(NP) swabs in vial transport medium  Result Value Ref Range Status   SARS Coronavirus 2 by RT PCR NEGATIVE NEGATIVE Final    Comment: (NOTE) SARS-CoV-2 target nucleic acids are NOT DETECTED.  The SARS-CoV-2 RNA is generally detectable in upper respiratory specimens during the acute phase of infection. The lowest concentration of SARS-CoV-2 viral copies this assay can detect is 138 copies/mL. A negative result does not preclude SARS-Cov-2 infection and should not be used as the sole basis for treatment or other patient management decisions. A negative result may occur with  improper specimen collection/handling, submission of specimen other than nasopharyngeal swab, presence of viral mutation(s) within the areas targeted by this assay, and inadequate number of viral copies(<138 copies/mL). A negative result must be combined with clinical observations, patient history, and epidemiological information. The expected result is Negative.  Fact Sheet for Patients:  EntrepreneurPulse.com.au  Fact Sheet for Healthcare Providers:  IncredibleEmployment.be  This test is no t yet approved or cleared by the [place] FDA and  has been authorized for detection and/or diagnosis of SARS-CoV-2 by FDA under an Emergency Use Authorization (EUA). This EUA will remain  in effect (meaning this test can be used) for the duration of the COVID-19 declaration under Section 564(b)(1) of the Act, 21 U.S.C.section 360bbb-3(b)(1), unless the authorization is terminated  or revoked sooner.       Influenza A by PCR NEGATIVE NEGATIVE Final    Influenza B by PCR NEGATIVE NEGATIVE Final    Comment: (NOTE) The Xpert Xpress SARS-CoV-2/FLU/RSV plus assay is intended as an aid in the diagnosis of influenza from Nasopharyngeal swab specimens and should not be used as a sole basis for treatment. Nasal washings and aspirates are unacceptable for Xpert Xpress SARS-CoV-2/FLU/RSV testing.  Fact Sheet for Patients: EntrepreneurPulse.com.au  Fact Sheet for Healthcare Providers: IncredibleEmployment.be  This test is not yet approved or cleared by the [place] FDA and has been authorized for detection and/or diagnosis of SARS-CoV-2 by FDA under an Emergency Use Authorization (EUA). This EUA will remain in effect (meaning this test can be used) for the duration of the COVID-19 declaration under Section 564(b)(1) of the Act, 21 U.S.C. section 360bbb-3(b)(1), unless the authorization is terminated or revoked.  Performed at [facility], [place] [address], [place], [place] [address]   Blood culture (routine x 2)     Status: None (Preliminary result)   Collection Time: 06/26/21 12:50 AM   Specimen: BLOOD  Result Value Ref Range Status   Specimen Description BLOOD RIGHT HAND  Final   Special Requests   Final    BOTTLES DRAWN AEROBIC AND ANAEROBIC Blood Culture adequate volume   Culture   Final    NO GROWTH 2 DAYS Performed at [facility], [place] [address] [place], [place] [address]    Report Status PENDING  Incomplete  Blood culture (routine x 2)     Status: None (Preliminary result)   Collection Time: 06/26/21 12:55 AM   Specimen: BLOOD  Result Value Ref Range Status   Specimen Description BLOOD LEFT ARM  Final   Special Requests   Final    BOTTLES DRAWN AEROBIC AND ANAEROBIC Blood Culture adequate volume   Culture   Final    NO GROWTH 2 DAYS Performed at [facility], [address] [place], [place] [address]    Report Status PENDING  Incomplete  MRSA Next Gen by PCR,  Nasal     Status: Abnormal   Collection Time: 06/26/21  4:47 AM  Result Value Ref Range Status   MRSA by PCR Next Gen DETECTED (A) NOT DETECTED Final    Comment: RESULT CALLED TO, READ BACK BY AND VERIFIED WITH: CHRIS SCHROWE,RN@[id number]  06/26/21 [place] (NOTE) The GeneXpert MRSA Assay (FDA approved for NASAL specimens only), is one component of a comprehensive MRSA colonization surveillance program. It is not intended to diagnose MRSA infection nor to guide or monitor treatment for MRSA infections. Test performance is not FDA approved in patients less than 78 years old. Performed at [facility], [place] [address] [place], [place] [address]   Respiratory (~20 pathogens) panel by PCR     Status: None   Collection Time: 06/26/21 10:26 AM   Specimen: Nasopharyngeal Swab; Respiratory  Result Value Ref Range Status   Adenovirus NOT DETECTED NOT DETECTED Final   Coronavirus 229E NOT DETECTED NOT DETECTED Final    Comment: (NOTE) The Coronavirus on the Respiratory Panel, DOES NOT test for the novel  Coronavirus (2019 nCoV)    Coronavirus HKU1 NOT DETECTED NOT DETECTED Final   Coronavirus NL63 NOT DETECTED NOT DETECTED Final   Coronavirus OC43 NOT DETECTED NOT DETECTED Final   Metapneumovirus NOT DETECTED NOT DETECTED Final   Rhinovirus / Enterovirus NOT DETECTED NOT DETECTED Final   Influenza A NOT DETECTED NOT DETECTED Final   Influenza B NOT DETECTED NOT DETECTED Final   Parainfluenza Virus 1 NOT DETECTED NOT DETECTED Final   Parainfluenza Virus 2 NOT DETECTED NOT DETECTED Final   Parainfluenza Virus 3 NOT DETECTED NOT DETECTED Final   Parainfluenza Virus 4 NOT DETECTED NOT DETECTED Final   Respiratory Syncytial Virus NOT DETECTED NOT DETECTED Final   Bordetella pertussis NOT DETECTED NOT DETECTED Final   Bordetella Parapertussis NOT DETECTED NOT DETECTED Final   Chlamydophila pneumoniae NOT DETECTED NOT DETECTED Final   Mycoplasma pneumoniae NOT DETECTED NOT DETECTED Final     Comment: Performed at [facility], [place]. [address], [place], [place] [address]  Urine Culture     Status: Abnormal (Preliminary result)   Collection Time: 06/26/21 12:34 PM   Specimen: Urine, Clean Catch  Result Value Ref Range Status   Specimen Description URINE, CLEAN CATCH  Final   Special Requests NONE  Final   Culture (A)  Final    >=100,000 COLONIES/mL KLEBSIELLA PNEUMONIAE SUSCEPTIBILITIES TO FOLLOW Performed at [facility], [place] [address], [place],  [address]    Report Status PENDING  Incomplete  Culture, Respiratory w Gram Stain     Status: None (Preliminary result)   Collection Time: 06/28/21  6:40 PM   Specimen: Tracheal Aspirate  Result Value Ref Range Status   Specimen Description TRACHEAL ASPIRATE  Final   Special Requests NONE  Final   Gram Stain   Final    ABUNDANT WBC PRESENT,BOTH PMN AND  MONONUCLEAR FEW YEAST RARE GRAM POSITIVE COCCI    Culture   Final    CULTURE REINCUBATED FOR BETTER GROWTH Performed at [facility], [place] [address] [place], [place] [address]    Report Status PENDING  Incomplete    RADIOLOGY STUDIES/RESULTS: No results found.   LOS: 3 days   Oren Binet, MD  [facility]  To contact the attending provider between 7A-7P or the covering provider during after hours 7P-7A, please log into the web site [email or web address] and access using universal [place] password for that web site. If you do not have the password, please call the hospital operator.  06/29/2021, 10:36 AM

## 2021-06-30 DIAGNOSIS — R8271 Bacteriuria: Secondary | ICD-10-CM | POA: Diagnosis present

## 2021-06-30 DIAGNOSIS — J189 Pneumonia, unspecified organism: Secondary | ICD-10-CM | POA: Diagnosis not present

## 2021-06-30 DIAGNOSIS — I5032 Chronic diastolic (congestive) heart failure: Secondary | ICD-10-CM | POA: Diagnosis not present

## 2021-06-30 DIAGNOSIS — G894 Chronic pain syndrome: Secondary | ICD-10-CM | POA: Diagnosis not present

## 2021-06-30 DIAGNOSIS — J9601 Acute respiratory failure with hypoxia: Secondary | ICD-10-CM | POA: Diagnosis not present

## 2021-06-30 LAB — CBC
HCT: 23.8 % — ABNORMAL LOW (ref 39.0–52.0)
Hemoglobin: 7.2 g/dL — ABNORMAL LOW (ref 13.0–17.0)
MCH: 28.2 pg (ref 26.0–34.0)
MCHC: 30.3 g/dL (ref 30.0–36.0)
MCV: 93.3 fL (ref 80.0–100.0)
Platelets: 219 10*3/uL (ref 150–400)
RBC: 2.55 MIL/uL — ABNORMAL LOW (ref 4.22–5.81)
RDW: 16 % — ABNORMAL HIGH (ref 11.5–15.5)
WBC: 8.4 10*3/uL (ref 4.0–10.5)
nRBC: 0 % (ref 0.0–0.2)

## 2021-06-30 LAB — GLUCOSE, CAPILLARY
Glucose-Capillary: 140 mg/dL — ABNORMAL HIGH (ref 70–99)
Glucose-Capillary: 128 mg/dL — ABNORMAL HIGH (ref 70–99)
Glucose-Capillary: 172 mg/dL — ABNORMAL HIGH (ref 70–99)
Glucose-Capillary: 175 mg/dL — ABNORMAL HIGH (ref 70–99)
Glucose-Capillary: 158 mg/dL — ABNORMAL HIGH (ref 70–99)

## 2021-06-30 LAB — COMPREHENSIVE METABOLIC PANEL
ALT: 26 U/L (ref 0–44)
AST: 24 U/L (ref 15–41)
Albumin: 2.2 g/dL — ABNORMAL LOW (ref 3.5–5.0)
Alkaline Phosphatase: 139 U/L — ABNORMAL HIGH (ref 38–126)
Anion gap: 5 (ref 5–15)
BUN: 35 mg/dL — ABNORMAL HIGH (ref 8–23)
CO2: 25 mmol/L (ref 22–32)
Calcium: 8.2 mg/dL — ABNORMAL LOW (ref 8.9–10.3)
Chloride: 106 mmol/L (ref 98–111)
Creatinine, Ser: 2.52 mg/dL — ABNORMAL HIGH (ref 0.61–1.24)
GFR, Estimated: 28 mL/min — ABNORMAL LOW (ref >60)
Glucose, Bld: 176 mg/dL — ABNORMAL HIGH (ref 70–99)
Potassium: 4.3 mmol/L (ref 3.5–5.1)
Sodium: 136 mmol/L (ref 135–145)
Total Bilirubin: 0.4 mg/dL (ref 0.3–1.2)
Total Protein: 6.4 g/dL — ABNORMAL LOW (ref 6.5–8.1)

## 2021-06-30 NOTE — Progress Notes (Signed)
Physical Therapy Evaluation Patient Details Name: NORVEL WENKER MRN: [id number] DOB: Feb 03, 1961 Today's Date: 06/30/2021  History of Present Illness  61 y.o. male, who [facility] ED[facility] SNF with a chief complaint of SOB, fever, and productive cough. Pt with repiratory failure with hypoxemia with SpO2 in the 60s on arrival. Started on NRB, then BiPap. PMH:CHF, HTN, HLD, COPD, osteomyelitis, RT AKA, chronic L leg wounds, L toe amputations, CKD3, DM2  Clinical Impression  Pt was seen for initial mobility, which is more dependent now but could previously self transfer to his scooter in SNF.  Today pt mobility was limited by the lines for HFNC set at 25L and 60%, improved over yesterday's O2 support needs.  Pt is a bit agitated initially but was finally willing to move, and did discuss his O2 set up needing to be progressed to make him a better candidate to get up to a chair.   Follow for acute PT goals, monitor O2 sats and will recommend SNF as this was his prior care level but needs frequent support for safety of chair transfers, for management of his medical care and physical assist as well as cognitive support of basic care needs.  PT will be able to follow him acutely and at next care venue.      Recommendations for follow up therapy are one component of a multi-disciplinary discharge planning process, led by the attending physician.  Recommendations may be updated based on patient status, additional functional criteria and insurance authorization.  Follow Up Recommendations Skilled nursing-short term rehab (<3 hours/day)    Assistance Recommended at Discharge Frequent or constant Supervision/Assistance  Patient can return home with the following  A lot of help with walking and/or transfers;Assistance with cooking/housework;Assist for transportation;A little help with bathing/dressing/bathroom    Equipment Recommendations None recommended by PT  Recommendations for Other Services        Functional Status Assessment Patient has had a recent decline in their functional status and demonstrates the ability to make significant improvements in function in a reasonable and predictable amount of time.     Precautions / Restrictions Precautions Precautions: Fall Precaution Comments: sacral and LLE wound, high flow heated O2 Required Braces or Orthoses: Other Brace Other Brace: awaiting prevalon for LLE Restrictions Weight Bearing Restrictions: No      Mobility  Bed Mobility Overal bed mobility: Needs Assistance Bed Mobility: Supine to Sit;Sit to Supine     Supine to sit: Min guard Sit to supine: Min guard   General bed mobility comments: pt can move but guarded due to bed structure with soft mattress    Transfers                   General transfer comment: declined to try due to limits of line for HFNC    Ambulation/Gait               General Gait Details: unable due to HFNC  Stairs            Wheelchair Mobility    Modified Rankin (Stroke Patients Only)       Balance Overall balance assessment: Needs assistance Sitting-balance support: Single extremity supported Sitting balance-Leahy Scale: Fair                                       Pertinent Vitals/Pain Pain Assessment: Faces Faces Pain Scale: Hurts a  little bit Facial Expression: Relaxed, neutral Body Movements: Absence of movements Muscle Tension: Relaxed Pain Location: generally on LLE and hips Pain Descriptors / Indicators: Discomfort Pain Intervention(s): Limited activity within patient's tolerance;Repositioned    Home Living Family/patient expects to be discharged to:: Skilled nursing facility                        Prior Function Prior Level of Function : Independent/Modified Independent             Mobility Comments: using scooter in the facility       Hand Dominance   Dominant Hand: Right    Extremity/Trunk Assessment    Upper Extremity Assessment Upper Extremity Assessment: Defer to OT evaluation    Lower Extremity Assessment Lower Extremity Assessment: LLE deficits/detail LLE Deficits / Details: stiffness and weakness with skin breakdown on foot LLE Coordination: decreased gross motor    Cervical / Trunk Assessment Cervical / Trunk Assessment: Other exceptions (body habitus)  Communication   Communication: No difficulties  Cognition Arousal/Alertness: Awake/alert Behavior During Therapy: WFL for tasks assessed/performed Overall Cognitive Status: Within Functional Limits for tasks assessed                                          General Comments General comments (skin integrity, edema, etc.): pt is on side of bed with O2 probe on hand, indicating lower sats with effort down to 83%.  However, pt is fully alert and not SOB.  O2 sat with bed level was 93%Cannula limitations require he stay on side of bed and cannot get a longer length to try to stand    Exercises     Assessment/Plan    PT Assessment Patient needs continued PT services  PT Problem List Cardiopulmonary status limiting activity;Decreased skin integrity;Obesity;Decreased balance;Decreased activity tolerance;Decreased strength;Decreased mobility;Decreased coordination;Decreased safety awareness       PT Treatment Interventions DME instruction;Functional mobility training;Therapeutic activities;Therapeutic exercise;Balance training;Neuromuscular re-education;Patient/family education    PT Goals (Current goals can be found in the Care Plan section)  Acute Rehab PT Goals Patient Stated Goal: to get back to PLOF and home PT Goal Formulation: With patient Time For Goal Achievement: 07/14/21 Potential to Achieve Goals: Fair    Frequency Min 2X/week     Co-evaluation               AM-PAC PT "6 Clicks" Mobility  Outcome Measure Help needed turning from your back to your side while in a flat bed without  using bedrails?: A Little Help needed moving from lying on your back to sitting on the side of a flat bed without using bedrails?: A Little Help needed moving to and from a bed to a chair (including a wheelchair)?: A Lot Help needed standing up from a chair using your arms (e.g., wheelchair or bedside chair)?: A Lot Help needed to walk in hospital room?: Total Help needed climbing 3-5 steps with a railing? : Total 6 Click Score: 12    End of Session Equipment Utilized During Treatment: Oxygen Activity Tolerance: Treatment limited secondary to medical complications (Comment);Patient limited by fatigue Patient left: in bed;with call bell/phone within reach;with bed alarm set Nurse Communication: Mobility status PT Visit Diagnosis: Other abnormalities of gait and mobility (R26.89);Other (comment) (respiratory failure)    Time: 8563-1497 PT Time Calculation (min) (ACUTE ONLY): 24 min   Charges:  PT Evaluation $PT Eval Moderate Complexity: 1 Mod PT Treatments $Therapeutic Activity: 8-22 mins       Ramond Dial 06/30/2021, 3:47 PM  Mee Hives, PT PhD Acute Rehab Dept. Number: [place] [facility] [place]

## 2021-06-30 NOTE — Progress Notes (Addendum)
PROGRESS NOTE        PATIENT DETAILS Name: Gregory [name] Age: 61 y.o. Sex: male Date of Birth: 06-11-1961 Admit Date: 06/26/2021 Admitting Physician Aldean Jewett, MD WUJ:[id number], Brandt Loosen, MD  Brief Narrative:  Patient is a 62 y.o. male with history of DM-2, HTN, chronic HFpEF, s/p right AKA, COPD-who was admitted to [facility] service on 1/9 with acute hypoxic respiratory failure due to community-acquired pneumonia.  Significant events: 1/9>> admit to ICU by PCCM-initially requiring BiPAP. 1/12>> transfer to [facility] on heated high flow  Subjective: Lying comfortably in bed-remains on heated high flow.  Comfortable and not in any distress.  Objective: Vitals: Blood pressure (!) 144/71, pulse 79, temperature (P) 98 F (36.7 C), temperature source (P) Oral, resp. rate 18, height 6' (1.829 m), weight 112 kg, SpO2 91 %.   Exam: Gen Exam:Alert awake-not in any distress HEENT:atraumatic, normocephalic Chest: B/L clear to auscultation anteriorly CVS:S1S2 regular Abdomen:soft non tender, non distended Extremities: LLE with lymphedema/chronic skin changes-s/p right AKA. Neurology: Non focal Skin: no rash   Pertinent Labs/Radiology: Recent Labs  Lab 06/26/21 0050 06/26/21 0203 06/28/21 0107 06/29/21 0140 06/30/21 0058  WBC 13.4*   < > 8.8  --  8.4  HGB 8.8*   < > 7.6*  --  7.2*  PLT 240   < > 186  --  219  NA 139   < > 139 136 136  K 4.7   < > 4.3 3.9 4.3  CREATININE 2.28*   < > 2.42* 2.50* 2.52*  AST 12*  --  19 27 24   ALT 10  --  12 23 26   ALKPHOS 84  --  85 133* 139*  BILITOT 0.7  --  0.5 0.2* 0.4   < > = values in this interval not displayed.    Assessment/Plan: Acute respiratory failure with hypoxia ([place])- (present on admission) Due to community-acquired pneumonia-on antibiotics/bronchodilators-all cultures negative to date.  Respiratory virus panel/COVID/influenza PCR negative.  Remains severely hypoxic but comfortable-on heated high flow.  Plan  is to wean off oxygen as tolerated.  Chronic diastolic CHF (congestive heart failure) ([place])- (present on admission) Do not think he has decompensated heart failure at this point-has lymphedema at baseline-remains scheduled Demadex-maintain a negative balance-and watch closely.  Creatinine not far from baseline-watch closely-keep patient on the dry side  COPD (chronic obstructive pulmonary disease) ([place])- (present on admission) Not in exacerbation-continue bronchodilators.  HTN (hypertension)- (present on admission) BP stable-off all antihypertensives-resume when able.  HLD (hyperlipidemia)- (present on admission) Continue Crestor   Normocytic anemia- (present on admission) Probably related to CKD-worsened with acute illness.  Watch closely, and transfuse if Hb <7.  Stage 3b chronic kidney disease (CKD) ([place])- (present on admission) Creatinine close to baseline-watch closely.  Should be okay to discontinue Foley catheter today-and watch closely.     Chronic pain- (present on admission) Continue Neurontin/Cymbalta/Lyrica and oxycodone.  S/P AKA (above knee amputation) unilateral, right ([place]) Monitor stump site.  Type II diabetes mellitus with neurological manifestations ([place])- (present on admission) CBG stable-continue Semglee 10 units daily and SSI, oral hypoglycemic agents on hold  CBG (last 3)  Recent Labs    06/28/21 1940 06/28/21 2344 06/29/21 0342  GLUCAP 198* 211* 139*    Asymptomatic bacteriuria- (present on admission) Urine culture drawn on admission was positive for multidrug-resistant Klebsiella pneumonia-this is felt  to be a colonization rather than a infection as patient did not have any UTI symptoms-his presentation is consistent with pneumonia and respiratory failure.  Do not think this needs to be treated at this point.  Tobacco use- (present on admission) Continue transdermal nicotine.  Lymphedema of left leg On diuretics.  Pressure injury, stage 3  ([place])- (present on admission) Wound care team following  Nutrition Status: Nutrition Problem: Increased nutrient needs Etiology: wound healing Signs/Symptoms: estimated needs Interventions: MVI, Prostat, Premier Protein  Obesity: Estimated body mass index is 33.49 kg/m as calculated from the following:   Height as of this encounter: 6' (1.829 m).   Weight as of this encounter: 112 kg.    Procedures: None Consults: PCCM DVT Prophylaxis: SQ Heparin Code Status:Full code  Family Communication: [id number] updated on 1/13.   Disposition Plan: Status is: Inpatient  Remains inpatient appropriate because: Severe hypoxia due to CAP-on heated high flow-SNF when more stable.    Diet: Diet Order             Diet Carb Modified Fluid consistency: Thin; Room service appropriate? Yes  Diet effective now                     Antimicrobial agents: Anti-infectives (From admission, onward)    Start     Dose/Rate Route Frequency Ordered Stop   06/29/21 1000  azithromycin (ZITHROMAX) tablet 500 mg        500 mg Oral Daily 06/28/21 0959 07/02/21 0959   06/28/21 0600  vancomycin (VANCOREADY) IVPB 1750 mg/350 mL  Status:  Discontinued        1,750 mg 175 mL/hr over 120 Minutes Intravenous Every 48 hours 06/27/21 0842 06/27/21 0924   06/27/21 1000  cefTRIAXone (ROCEPHIN) 2 g in sodium chloride 0.9 % 100 mL IVPB        2 g 200 mL/hr over 30 Minutes Intravenous Every 24 hours 06/27/21 0924 07/04/21 0959   06/27/21 1000  azithromycin (ZITHROMAX) 500 mg in sodium chloride 0.9 % 250 mL IVPB        500 mg 250 mL/hr over 60 Minutes Intravenous Every 24 hours 06/27/21 0924 06/28/21 1148   06/26/21 1600  ceFEPIme (MAXIPIME) 2 g in sodium chloride 0.9 % 100 mL IVPB  Status:  Discontinued        2 g 200 mL/hr over 30 Minutes Intravenous Every 12 hours 06/26/21 0438 06/27/21 0924   06/26/21 0438  vancomycin variable dose per unstable renal function (pharmacist dosing)  Status:   Discontinued         Does not apply See admin instructions 06/26/21 0438 06/27/21 0848   06/26/21 0430  vancomycin (VANCOREADY) IVPB 2000 mg/400 mL        2,000 mg 200 mL/hr over 120 Minutes Intravenous  Once 06/26/21 0418 06/26/21 0800   06/26/21 0430  ceFEPIme (MAXIPIME) 2 g in sodium chloride 0.9 % 100 mL IVPB        2 g 200 mL/hr over 30 Minutes Intravenous  Once 06/26/21 0418 06/26/21 0541   06/26/21 0200  cefTRIAXone (ROCEPHIN) 1 g in sodium chloride 0.9 % 100 mL IVPB        1 g 200 mL/hr over 30 Minutes Intravenous  Once 06/26/21 0154 06/26/21 0226   06/26/21 0200  azithromycin (ZITHROMAX) 500 mg in sodium chloride 0.9 % 250 mL IVPB        500 mg 250 mL/hr over 60 Minutes Intravenous  Once 06/26/21 0154 06/26/21 0349  MEDICATIONS: Scheduled Meds:  (feeding supplement) PROSource Plus  30 mL Oral Daily   arformoterol  15 mcg Nebulization BID   azithromycin  500 mg Oral Daily   budesonide (PULMICORT) nebulizer solution  0.5 mg Nebulization BID   buPROPion  300 mg Oral Daily   chlorhexidine  15 mL Mouth Rinse BID   Chlorhexidine Gluconate Cloth  6 each Topical Daily   docusate sodium  100 mg Oral Daily   DULoxetine  60 mg Oral Daily   gabapentin  300 mg Oral Q8H   guaiFENesin  1,200 mg Oral BID   heparin  5,000 Units Subcutaneous Q8H   insulin aspart  0-20 Units Subcutaneous Q4H   insulin glargine-yfgn  10 Units Subcutaneous Daily   mouth rinse  15 mL Mouth Rinse q12n4p   multivitamin  1 tablet Oral QHS   mupirocin ointment  1 application Nasal BID   nicotine  21 mg Transdermal Daily   oxyCODONE  30 mg Oral Q8H   pantoprazole  40 mg Oral Daily   pregabalin  150 mg Oral BID   Ensure Max Protein  11 oz Oral Daily   rosuvastatin  10 mg Oral QHS   tamsulosin  0.4 mg Oral QPM   torsemide  20 mg Oral Daily   umeclidinium bromide  1 puff Inhalation Daily   Continuous Infusions:  sodium chloride Stopped (06/28/21 1202)   cefTRIAXone (ROCEPHIN)  IV 2 g (06/30/21  0918)   PRN Meds:.sodium chloride, acetaminophen, albuterol, ALPRAZolam, calcium carbonate, docusate sodium, eszopiclone, fentaNYL (SUBLIMAZE) injection, HYDROmorphone, polyethylene glycol, tiZANidine   I have personally reviewed following labs and imaging studies  LABORATORY DATA: CBC: Recent Labs  Lab 06/26/21 0050 06/26/21 0203 06/27/21 0554 06/28/21 0107 06/30/21 0058  WBC 13.4*  --  10.6* 8.8 8.4  NEUTROABS 11.2*  --   --  5.8  --   HGB 8.8* 9.2* 7.7* 7.6* 7.2*  HCT 28.8* 27.0* 25.9* 25.3* 23.8*  MCV 92.9  --  94.2 92.7 93.3  PLT 240  --  199 186 503    Basic Metabolic Panel: Recent Labs  Lab 06/26/21 0050 06/26/21 0203 06/27/21 0554 06/28/21 0107 06/29/21 0140 06/30/21 0058  NA 139 138 137 139 136 136  K 4.7 4.6 4.4 4.3 3.9 4.3  CL 106  --  104 104 102 106  CO2 25  --  23 24 25 25   GLUCOSE 195*  --  153* 134* 164* 176*  BUN 33*  --  33* 33* 34* 35*  CREATININE 2.28*  --  2.44* 2.42* 2.50* 2.52*  CALCIUM 8.3*  --  8.1* 8.5* 8.3* 8.2*  MG  --   --  1.8  --   --   --   PHOS  --   --  3.5  --   --   --     GFR: Estimated Creatinine Clearance: 39.3 mL/min (A) (by C-G formula based on SCr of 2.52 mg/dL (H)).  Liver Function Tests: Recent Labs  Lab 06/26/21 0050 06/28/21 0107 06/29/21 0140 06/30/21 0058  AST 12* 19 27 24   ALT 10 12 23 26   ALKPHOS 84 85 133* 139*  BILITOT 0.7 0.5 0.2* 0.4  PROT 7.2 6.3* 6.1* 6.4*  ALBUMIN 3.0* 2.2* 2.1* 2.2*   No results for input(s): LIPASE, AMYLASE in the last 168 hours. No results for input(s): AMMONIA in the last 168 hours.  Coagulation Profile: No results for input(s): INR, PROTIME in the last 168 hours.  Cardiac Enzymes: No results  for input(s): CKTOTAL, CKMB, CKMBINDEX, TROPONINI in the last 168 hours.  BNP (last 3 results) No results for input(s): PROBNP in the last 8760 hours.  Lipid Profile: No results for input(s): CHOL, HDL, LDLCALC, TRIG, CHOLHDL, LDLDIRECT in the last 72 hours.  Thyroid Function  Tests: No results for input(s): TSH, T4TOTAL, FREET4, T3FREE, THYROIDAB in the last 72 hours.  Anemia Panel: No results for input(s): VITAMINB12, FOLATE, FERRITIN, TIBC, IRON, RETICCTPCT in the last 72 hours.  Urine analysis:    Component Value Date/Time   COLORURINE YELLOW 06/26/2021 1234   APPEARANCEUR CLEAR 06/26/2021 1234   LABSPEC 1.015 06/26/2021 1234   PHURINE 6.0 06/26/2021 1234   GLUCOSEU NEGATIVE 06/26/2021 1234   HGBUR TRACE (A) 06/26/2021 1234   BILIRUBINUR NEGATIVE 06/26/2021 1234   KETONESUR NEGATIVE 06/26/2021 1234   PROTEINUR 100 (A) 06/26/2021 1234   UROBILINOGEN 1.0 02/03/2014 0052   NITRITE POSITIVE (A) 06/26/2021 1234   LEUKOCYTESUR TRACE (A) 06/26/2021 1234    Sepsis Labs: Lactic Acid, Venous    Component Value Date/Time   LATICACIDVEN 1.0 06/26/2021 0038    MICROBIOLOGY: Recent Results (from the past 240 hour(s))  Resp Panel by RT-PCR (Flu A&B, Covid)     Status: None   Collection Time: 06/26/21 12:38 AM   Specimen: Nasopharyngeal(NP) swabs in vial transport medium  Result Value Ref Range Status   SARS Coronavirus 2 by RT PCR NEGATIVE NEGATIVE Final    Comment: (NOTE) SARS-CoV-2 target nucleic acids are NOT DETECTED.  The SARS-CoV-2 RNA is generally detectable in upper respiratory specimens during the acute phase of infection. The lowest concentration of SARS-CoV-2 viral copies this assay can detect is 138 copies/mL. A negative result does not preclude SARS-Cov-2 infection and should not be used as the sole basis for treatment or other patient management decisions. A negative result may occur with  improper specimen collection/handling, submission of specimen other than nasopharyngeal swab, presence of viral mutation(s) within the areas targeted by this assay, and inadequate number of viral copies(<138 copies/mL). A negative result must be combined with clinical observations, patient history, and epidemiological information. The expected result  is Negative.  Fact Sheet for Patients:  EntrepreneurPulse.com.au  Fact Sheet for Healthcare Providers:  IncredibleEmployment.be  This test is no t yet approved or cleared by the [place] FDA and  has been authorized for detection and/or diagnosis of SARS-CoV-2 by FDA under an Emergency Use Authorization (EUA). This EUA will remain  in effect (meaning this test can be used) for the duration of the COVID-19 declaration under Section 564(b)(1) of the Act, 21 U.S.C.section 360bbb-3(b)(1), unless the authorization is terminated  or revoked sooner.       Influenza A by PCR NEGATIVE NEGATIVE Final   Influenza B by PCR NEGATIVE NEGATIVE Final    Comment: (NOTE) The Xpert Xpress SARS-CoV-2/FLU/RSV plus assay is intended as an aid in the diagnosis of influenza from Nasopharyngeal swab specimens and should not be used as a sole basis for treatment. Nasal washings and aspirates are unacceptable for Xpert Xpress SARS-CoV-2/FLU/RSV testing.  Fact Sheet for Patients: EntrepreneurPulse.com.au  Fact Sheet for Healthcare Providers: IncredibleEmployment.be  This test is not yet approved or cleared by the [place] FDA and has been authorized for detection and/or diagnosis of SARS-CoV-2 by FDA under an Emergency Use Authorization (EUA). This EUA will remain in effect (meaning this test can be used) for the duration of the COVID-19 declaration under Section 564(b)(1) of the Act, 21 U.S.C. section 360bbb-3(b)(1), unless the authorization is  terminated or revoked.  Performed at [facility], [place] [address] [place], [place] [address]   Blood culture (routine x 2)     Status: None (Preliminary result)   Collection Time: 06/26/21 12:50 AM   Specimen: BLOOD  Result Value Ref Range Status   Specimen Description BLOOD RIGHT HAND  Final   Special Requests   Final    BOTTLES DRAWN AEROBIC AND ANAEROBIC Blood  Culture adequate volume   Culture   Final    NO GROWTH 4 DAYS Performed at [facility], [place] [address], [place], [place] [address]    Report Status PENDING  Incomplete  Blood culture (routine x 2)     Status: None (Preliminary result)   Collection Time: 06/26/21 12:55 AM   Specimen: BLOOD  Result Value Ref Range Status   Specimen Description BLOOD LEFT ARM  Final   Special Requests   Final    BOTTLES DRAWN AEROBIC AND ANAEROBIC Blood Culture adequate volume   Culture   Final    NO GROWTH 4 DAYS Performed at [facility], [place] [address] [place], [place] [address]    Report Status PENDING  Incomplete  MRSA Next Gen by PCR, Nasal     Status: Abnormal   Collection Time: 06/26/21  4:47 AM  Result Value Ref Range Status   MRSA by PCR Next Gen DETECTED (A) NOT DETECTED Final    Comment: RESULT CALLED TO, READ BACK BY AND VERIFIED WITH: CHRIS SCHROWE,RN@0639  06/26/21 [place] (NOTE) The GeneXpert MRSA Assay (FDA approved for NASAL specimens only), is one component of a comprehensive MRSA colonization surveillance program. It is not intended to diagnose MRSA infection nor to guide or monitor treatment for MRSA infections. Test performance is not FDA approved in patients less than 51 years old. Performed at [facility], [place] [address], [place], [place] [address]   Respiratory (~20 pathogens) panel by PCR     Status: None   Collection Time: 06/26/21 10:26 AM   Specimen: Nasopharyngeal Swab; Respiratory  Result Value Ref Range Status   Adenovirus NOT DETECTED NOT DETECTED Final   Coronavirus 229E NOT DETECTED NOT DETECTED Final    Comment: (NOTE) The Coronavirus on the Respiratory Panel, DOES NOT test for the novel  Coronavirus (2019 nCoV)    Coronavirus HKU1 NOT DETECTED NOT DETECTED Final   Coronavirus NL63 NOT DETECTED NOT DETECTED Final   Coronavirus OC43 NOT DETECTED NOT DETECTED Final   Metapneumovirus NOT DETECTED NOT DETECTED Final   Rhinovirus / Enterovirus  NOT DETECTED NOT DETECTED Final   Influenza A NOT DETECTED NOT DETECTED Final   Influenza B NOT DETECTED NOT DETECTED Final   Parainfluenza Virus 1 NOT DETECTED NOT DETECTED Final   Parainfluenza Virus 2 NOT DETECTED NOT DETECTED Final   Parainfluenza Virus 3 NOT DETECTED NOT DETECTED Final   Parainfluenza Virus 4 NOT DETECTED NOT DETECTED Final   Respiratory Syncytial Virus NOT DETECTED NOT DETECTED Final   Bordetella pertussis NOT DETECTED NOT DETECTED Final   Bordetella Parapertussis NOT DETECTED NOT DETECTED Final   Chlamydophila pneumoniae NOT DETECTED NOT DETECTED Final   Mycoplasma pneumoniae NOT DETECTED NOT DETECTED Final    Comment: Performed at [facility], [place] [address] [place], [place] [address]  Urine Culture     Status: Abnormal   Collection Time: 06/26/21 12:34 PM   Specimen: Urine, Clean Catch  Result Value Ref Range Status   Specimen Description URINE, CLEAN CATCH  Final   Special Requests NONE  Final   Culture (A)  Final    >=100,000 COLONIES/mL KLEBSIELLA PNEUMONIAE Confirmed Extended Spectrum Beta-Lactamase Producer (ESBL).  In bloodstream infections from ESBL organisms, carbapenems are preferred over piperacillin/tazobactam. They are shown to have a lower risk of mortality. MULTI-DRUG RESISTANT ORGANISM CRITICAL RESULT CALLED TO, READ BACK BY AND VERIFIED WITH: RN [email or web address] AT 4492 ON 06/29/2021 BY T.SAAD. Performed at [facility], [place] [address], [place], [place] [address]    Report Status 06/29/2021 FINAL  Final   Organism ID, Bacteria KLEBSIELLA PNEUMONIAE (A)  Final      Susceptibility   Klebsiella pneumoniae - MIC*    AMPICILLIN >=32 RESISTANT Resistant     CEFAZOLIN >=64 RESISTANT Resistant     CEFEPIME >=32 RESISTANT Resistant     CEFTRIAXONE >=64 RESISTANT Resistant     CIPROFLOXACIN >=4 RESISTANT Resistant     GENTAMICIN >=16 RESISTANT Resistant     IMIPENEM <=0.25 SENSITIVE Sensitive     NITROFURANTOIN 256 RESISTANT Resistant      TRIMETH/SULFA >=320 RESISTANT Resistant     AMPICILLIN/SULBACTAM >=32 RESISTANT Resistant     PIP/TAZO >=128 RESISTANT Resistant     * >=100,000 COLONIES/mL KLEBSIELLA PNEUMONIAE  Carbapenem Resistance Panel     Status: None   Collection Time: 06/26/21 12:34 PM  Result Value Ref Range Status   Carba Resistance IMP Gene NOT DETECTED NOT DETECTED Final   Carba Resistance VIM Gene NOT DETECTED NOT DETECTED Final   Carba Resistance NDM Gene NOT DETECTED NOT DETECTED Final   Carba Resistance KPC Gene NOT DETECTED NOT DETECTED Final   Carba Resistance OXA48 Gene NOT DETECTED NOT DETECTED Final    Comment: (NOTE) Cepheid Carba-R is an FDA-cleared nucleic acid amplification test  (NAAT)for the detection and differentiation of genes encoding the  most prevalent carbapenemases in bacterial isolate samples. Carbapenemase gene identification and implementation of comprehensive  infection control measures are recommended by the CDC to prevent the  spread of the resistant organisms. Performed at [facility], [place] [address] [place], [place] [address]   Culture, Respiratory w Gram Stain     Status: None (Preliminary result)   Collection Time: 06/28/21  6:40 PM   Specimen: Tracheal Aspirate  Result Value Ref Range Status   Specimen Description TRACHEAL ASPIRATE  Final   Special Requests NONE  Final   Gram Stain   Final    ABUNDANT WBC PRESENT,BOTH PMN AND MONONUCLEAR FEW YEAST RARE GRAM POSITIVE COCCI    Culture   Final    CULTURE REINCUBATED FOR BETTER GROWTH Performed at [facility], [place] [address] [place], [place] [address]    Report Status PENDING  Incomplete    RADIOLOGY STUDIES/RESULTS: No results found.   LOS: 4 days   Oren Binet, MD  [facility]  To contact the attending provider between 7A-7P or the covering provider during after hours 7P-7A, please log into the web site [email or web address] and access using universal [place] password for that web  site. If you do not have the password, please call the hospital operator.  06/30/2021, 12:04 PM

## 2021-06-30 NOTE — Progress Notes (Signed)
OT Cancellation Note  Patient Details Name: Gregory [name] MRN: [id number] DOB: [birth year]   Cancelled Treatment:    Reason Eval/Treat Not Completed: Patient declined, no reason specified: Pt only stating "it's not a good time." And refusing any OT including bed level exercises.  Pt was provided with Level 2 Theraband and written HEP as well as handout on energy conservation to begin self-study. Pt shown the exercises and how to use the band but declined to demonstrate understanding back. Pt asked for mango ice and cup of ice which was provided after cleared by RN. Pt stated that he will look his handouts over.   Julien Girt 06/30/2021, 1:38 PM

## 2021-06-30 NOTE — Assessment & Plan Note (Signed)
Urine culture drawn on admission was positive for multidrug-resistant Klebsiella pneumonia-this is felt to be a colonization rather than a infection as patient did not have any UTI symptoms-his presentation is consistent with pneumonia and respiratory failure.  Do not think this needs to be treated at this point.

## 2021-06-30 NOTE — Progress Notes (Signed)
Patient refused air alternating pressure mattress. Patient and patient's daughter both verbally stated they understood the risks of switching beds. Patient was moved to a regular hospital bed.

## 2021-06-30 NOTE — Plan of Care (Signed)
Pt remains on 60% 25 liters high flow to maintain sats about 90.  Pt does have congested cough.  Pt continues to request PRN pain meds in addition to his scheduled meds.   Ayesha Mohair BSN RN CMSRN  Problem: Clinical Measurements: Goal: Respiratory complications will improve Outcome: Not Progressing   Problem: Pain Managment: Goal: General experience of comfort will improve Outcome: Not Progressing

## 2021-07-01 ENCOUNTER — Inpatient Hospital Stay (HOSPITAL_COMMUNITY): Payer: Medicaid Other

## 2021-07-01 DIAGNOSIS — J189 Pneumonia, unspecified organism: Secondary | ICD-10-CM | POA: Diagnosis not present

## 2021-07-01 DIAGNOSIS — G894 Chronic pain syndrome: Secondary | ICD-10-CM | POA: Diagnosis not present

## 2021-07-01 DIAGNOSIS — J9601 Acute respiratory failure with hypoxia: Secondary | ICD-10-CM | POA: Diagnosis not present

## 2021-07-01 DIAGNOSIS — I5032 Chronic diastolic (congestive) heart failure: Secondary | ICD-10-CM | POA: Diagnosis not present

## 2021-07-01 LAB — CULTURE, RESPIRATORY W GRAM STAIN: Culture: NORMAL

## 2021-07-01 LAB — BASIC METABOLIC PANEL
Anion gap: 11 (ref 5–15)
BUN: 36 mg/dL — ABNORMAL HIGH (ref 8–23)
CO2: 26 mmol/L (ref 22–32)
Calcium: 8.8 mg/dL — ABNORMAL LOW (ref 8.9–10.3)
Chloride: 102 mmol/L (ref 98–111)
Creatinine, Ser: 2.14 mg/dL — ABNORMAL HIGH (ref 0.61–1.24)
GFR, Estimated: 34 mL/min — ABNORMAL LOW (ref >60)
Glucose, Bld: 128 mg/dL — ABNORMAL HIGH (ref 70–99)
Potassium: 4.9 mmol/L (ref 3.5–5.1)
Sodium: 139 mmol/L (ref 135–145)

## 2021-07-01 LAB — CBC
HCT: 23.8 % — ABNORMAL LOW (ref 39.0–52.0)
Hemoglobin: 7.1 g/dL — ABNORMAL LOW (ref 13.0–17.0)
MCH: 27.4 pg (ref 26.0–34.0)
MCHC: 29.8 g/dL — ABNORMAL LOW (ref 30.0–36.0)
MCV: 91.9 fL (ref 80.0–100.0)
Platelets: 295 10*3/uL (ref 150–400)
RBC: 2.59 MIL/uL — ABNORMAL LOW (ref 4.22–5.81)
RDW: 15.7 % — ABNORMAL HIGH (ref 11.5–15.5)
WBC: 9.8 10*3/uL (ref 4.0–10.5)
nRBC: 0 % (ref 0.0–0.2)

## 2021-07-01 LAB — CULTURE, BLOOD (ROUTINE X 2)
Culture: NO GROWTH
Culture: NO GROWTH
Special Requests: ADEQUATE
Special Requests: ADEQUATE

## 2021-07-01 LAB — GLUCOSE, CAPILLARY
Glucose-Capillary: 151 mg/dL — ABNORMAL HIGH (ref 70–99)
Glucose-Capillary: 151 mg/dL — ABNORMAL HIGH (ref 70–99)
Glucose-Capillary: 169 mg/dL — ABNORMAL HIGH (ref 70–99)
Glucose-Capillary: 170 mg/dL — ABNORMAL HIGH (ref 70–99)
Glucose-Capillary: 192 mg/dL — ABNORMAL HIGH (ref 70–99)
Glucose-Capillary: 252 mg/dL — ABNORMAL HIGH (ref 70–99)
Glucose-Capillary: 298 mg/dL — ABNORMAL HIGH (ref 70–99)

## 2021-07-01 MED ORDER — TORSEMIDE 20 MG PO TABS
40.0000 mg | ORAL_TABLET | Freq: Every day | ORAL | Status: DC
  Administered 2021-07-02 – 2021-07-04 (×3): 40 mg via ORAL
  Filled 2021-07-01 (×3): qty 2

## 2021-07-01 NOTE — Progress Notes (Signed)
PROGRESS NOTE        PATIENT DETAILS Name: Gregory [name] Age: 61 y.o. Sex: male Date of Birth: [birth year] Admit Date: 06/26/2021 Admitting Physician Aldean Jewett, MD [id number], Brandt Loosen, MD  Brief Narrative:  Patient is a 61 y.o. male with history of DM-2, HTN, chronic HFpEF, s/p right AKA, COPD-who was admitted to [facility] service on 1/9 with acute hypoxic respiratory failure due to community-acquired pneumonia.  Significant events: 1/9>> admit to ICU by PCCM-initially requiring BiPAP. 1/12>> transfer to [facility] on heated high flow  Subjective: Appears very comfortable-not short of breath at rest-no chest pain.  Objective: Vitals: Blood pressure (!) 149/67, pulse 62, temperature 97.7 F (36.5 C), temperature source Axillary, resp. rate 16, height 6' (1.829 m), weight 112 kg, SpO2 94 %.   Exam: Gen Exam:Alert awake-not in any distress HEENT:atraumatic, normocephalic Chest: B/L clear to auscultation anteriorly CVS:S1S2 regular Abdomen:soft non tender, non distended Extremities: S/p right AKA-LLE with lymphedema/chronic skin changes. Neurology: Non focal Skin: no rash   Pertinent Labs/Radiology: Recent Labs  Lab 06/26/21 0050 06/26/21 0203 06/28/21 0107 06/29/21 0140 06/30/21 0058 07/01/21 0136  WBC 13.4*   < > 8.8  --  8.4 9.8  HGB 8.8*   < > 7.6*  --  7.2* 7.1*  PLT 240   < > 186  --  219 295  NA 139   < > 139 136 136 139  K 4.7   < > 4.3 3.9 4.3 4.9  CREATININE 2.28*   < > 2.42* 2.50* 2.52* 2.14*  AST 12*  --  19 27 24   --   ALT 10  --  12 23 26   --   ALKPHOS 84  --  85 133* 139*  --   BILITOT 0.7  --  0.5 0.2* 0.4  --    < > = values in this interval not displayed.    Assessment/Plan: Acute respiratory failure with hypoxia ([place])- (present on admission) Due to community-acquired pneumonia. Remains severely hypoxic but comfortable-on heated high flow.  Plan is to wean off oxygen as tolerated.  Continue diuretics and attempt to keep  patient in a negative balance.  Pneumonia- (present on admission) Probably atypical-however COVID/influenza PCR/respiratory virus panel/sputum/blood cultures are all negative.  Has completed a course of Zithromax-remains on IV Rocephin.  COPD (chronic obstructive pulmonary disease) ([place])- (present on admission) Not in exacerbation-continue bronchodilators.  Chronic diastolic CHF (congestive heart failure) ([place])- (present on admission) Do not think he has decompensated heart failure at this point-has lymphedema at baseline-remains on scheduled Demadex-continue to maintain negative balance.  Echo on 1/9 with preserved EF.  HLD (hyperlipidemia)- (present on admission) Continue Crestor   HTN (hypertension)- (present on admission) BP stable-off all antihypertensives-resume when able.  Normocytic anemia- (present on admission) Probably related to CKD-worsened with acute illness.  Watch closely, and transfuse if Hb <7.  Chronic pain- (present on admission) Continue Neurontin/Cymbalta/Lyrica and oxycodone.  Stage 3b chronic kidney disease (CKD) ([place])- (present on admission) Creatinine not far from usual baseline-continue to closely monitor while on diuretics.  We will allow for some mild elevation in creatinine that his usual baseline in an attempt to keep patient on the drier side.   Type II diabetes mellitus with neurological manifestations ([place])- (present on admission) CBG stable-continue Semglee 10 units daily and SSI, oral hypoglycemic agents on hold  CBG (last  3)  Recent Labs    06/28/21 1940 06/28/21 2344 06/29/21 0342  GLUCAP 198* 211* 139*    S/P AKA (above knee amputation) unilateral, right (HCC) Monitor stump site.  Pressure injury, stage 3 ([place])- (present on admission) Wound care team following  Lymphedema of left leg On diuretics.  Tobacco use- (present on admission) Continue transdermal nicotine.  Asymptomatic bacteriuria- (present on admission) Urine culture  drawn on admission was positive for multidrug-resistant Klebsiella pneumonia-this is felt to be a colonization rather than a infection as patient did not have any UTI symptoms-his presentation is consistent with pneumonia and respiratory failure.  Do not think this needs to be treated at this point.  Nutrition Status: Nutrition Problem: Increased nutrient needs Etiology: wound healing Signs/Symptoms: estimated needs Interventions: MVI, Prostat, Premier Protein  Obesity: Estimated body mass index is 33.49 kg/m as calculated from the following:   Height as of this encounter: 6' (1.829 m).   Weight as of this encounter: 112 kg.   Procedures: None Consults: PCCM DVT Prophylaxis: SQ Heparin Code Status:Full code  Family Communication: [id number] updated on 1/13.   Disposition Plan: Status is: Inpatient  Remains inpatient appropriate because: Severe hypoxia due to CAP-on heated high flow-SNF when more stable.    Diet: Diet Order             Diet Carb Modified Fluid consistency: Thin; Room service appropriate? Yes  Diet effective now                     Antimicrobial agents: Anti-infectives (From admission, onward)    Start     Dose/Rate Route Frequency Ordered Stop   06/29/21 1000  azithromycin (ZITHROMAX) tablet 500 mg        500 mg Oral Daily 06/28/21 0959 07/01/21 0924   06/28/21 0600  vancomycin (VANCOREADY) IVPB 1750 mg/350 mL  Status:  Discontinued        1,750 mg 175 mL/hr over 120 Minutes Intravenous Every 48 hours 06/27/21 0842 06/27/21 0924   06/27/21 1000  cefTRIAXone (ROCEPHIN) 2 g in sodium chloride 0.9 % 100 mL IVPB        2 g 200 mL/hr over 30 Minutes Intravenous Every 24 hours 06/27/21 0924 07/04/21 0959   06/27/21 1000  azithromycin (ZITHROMAX) 500 mg in sodium chloride 0.9 % 250 mL IVPB        500 mg 250 mL/hr over 60 Minutes Intravenous Every 24 hours 06/27/21 0924 06/28/21 1148   06/26/21 1600  ceFEPIme (MAXIPIME) 2 g in sodium  chloride 0.9 % 100 mL IVPB  Status:  Discontinued        2 g 200 mL/hr over 30 Minutes Intravenous Every 12 hours 06/26/21 0438 06/27/21 0924   06/26/21 0438  vancomycin variable dose per unstable renal function (pharmacist dosing)  Status:  Discontinued         Does not apply See admin instructions 06/26/21 0438 06/27/21 0848   06/26/21 0430  vancomycin (VANCOREADY) IVPB 2000 mg/400 mL        2,000 mg 200 mL/hr over 120 Minutes Intravenous  Once 06/26/21 0418 06/26/21 0800   06/26/21 0430  ceFEPIme (MAXIPIME) 2 g in sodium chloride 0.9 % 100 mL IVPB        2 g 200 mL/hr over 30 Minutes Intravenous  Once 06/26/21 0418 06/26/21 0541   06/26/21 0200  cefTRIAXone (ROCEPHIN) 1 g in sodium chloride 0.9 % 100 mL IVPB        1 g 200  mL/hr over 30 Minutes Intravenous  Once 06/26/21 0154 06/26/21 0226   06/26/21 0200  azithromycin (ZITHROMAX) 500 mg in sodium chloride 0.9 % 250 mL IVPB        500 mg 250 mL/hr over 60 Minutes Intravenous  Once 06/26/21 0154 06/26/21 0349        MEDICATIONS: Scheduled Meds:  (feeding supplement) PROSource Plus  30 mL Oral Daily   arformoterol  15 mcg Nebulization BID   budesonide (PULMICORT) nebulizer solution  0.5 mg Nebulization BID   buPROPion  300 mg Oral Daily   chlorhexidine  15 mL Mouth Rinse BID   Chlorhexidine Gluconate Cloth  6 each Topical Daily   docusate sodium  100 mg Oral Daily   DULoxetine  60 mg Oral Daily   gabapentin  300 mg Oral Q8H   guaiFENesin  1,200 mg Oral BID   heparin  5,000 Units Subcutaneous Q8H   insulin aspart  0-20 Units Subcutaneous Q4H   insulin glargine-yfgn  10 Units Subcutaneous Daily   mouth rinse  15 mL Mouth Rinse q12n4p   multivitamin  1 tablet Oral QHS   nicotine  21 mg Transdermal Daily   oxyCODONE  30 mg Oral Q8H   pantoprazole  40 mg Oral Daily   pregabalin  150 mg Oral BID   Ensure Max Protein  11 oz Oral Daily   rosuvastatin  10 mg Oral QHS   tamsulosin  0.4 mg Oral QPM   torsemide  20 mg Oral Daily    umeclidinium bromide  1 puff Inhalation Daily   Continuous Infusions:  sodium chloride Stopped (06/28/21 1202)   cefTRIAXone (ROCEPHIN)  IV 2 g (07/01/21 0945)   PRN Meds:.sodium chloride, acetaminophen, albuterol, ALPRAZolam, calcium carbonate, docusate sodium, eszopiclone, fentaNYL (SUBLIMAZE) injection, HYDROmorphone, polyethylene glycol, tiZANidine   I have personally reviewed following labs and imaging studies  LABORATORY DATA: CBC: Recent Labs  Lab 06/26/21 0050 06/26/21 0203 06/27/21 0554 06/28/21 0107 06/30/21 0058 07/01/21 0136  WBC 13.4*  --  10.6* 8.8 8.4 9.8  NEUTROABS 11.2*  --   --  5.8  --   --   HGB 8.8* 9.2* 7.7* 7.6* 7.2* 7.1*  HCT 28.8* 27.0* 25.9* 25.3* 23.8* 23.8*  MCV 92.9  --  94.2 92.7 93.3 91.9  PLT 240  --  199 186 219 025    Basic Metabolic Panel: Recent Labs  Lab 06/27/21 0554 06/28/21 0107 06/29/21 0140 06/30/21 0058 07/01/21 0136  NA 137 139 136 136 139  K 4.4 4.3 3.9 4.3 4.9  CL 104 104 102 106 102  CO2 23 24 25 25 26   GLUCOSE 153* 134* 164* 176* 128*  BUN 33* 33* 34* 35* 36*  CREATININE 2.44* 2.42* 2.50* 2.52* 2.14*  CALCIUM 8.1* 8.5* 8.3* 8.2* 8.8*  MG 1.8  --   --   --   --   PHOS 3.5  --   --   --   --     GFR: Estimated Creatinine Clearance: 46.3 mL/min (A) (by C-G formula based on SCr of 2.14 mg/dL (H)).  Liver Function Tests: Recent Labs  Lab 06/26/21 0050 06/28/21 0107 06/29/21 0140 06/30/21 0058  AST 12* 19 27 24   ALT 10 12 23 26   ALKPHOS 84 85 133* 139*  BILITOT 0.7 0.5 0.2* 0.4  PROT 7.2 6.3* 6.1* 6.4*  ALBUMIN 3.0* 2.2* 2.1* 2.2*   No results for input(s): LIPASE, AMYLASE in the last 168 hours. No results for input(s): AMMONIA in the last  168 hours.  Coagulation Profile: No results for input(s): INR, PROTIME in the last 168 hours.  Cardiac Enzymes: No results for input(s): CKTOTAL, CKMB, CKMBINDEX, TROPONINI in the last 168 hours.  BNP (last 3 results) No results for input(s): PROBNP in the last  8760 hours.  Lipid Profile: No results for input(s): CHOL, HDL, LDLCALC, TRIG, CHOLHDL, LDLDIRECT in the last 72 hours.  Thyroid Function Tests: No results for input(s): TSH, T4TOTAL, FREET4, T3FREE, THYROIDAB in the last 72 hours.  Anemia Panel: No results for input(s): VITAMINB12, FOLATE, FERRITIN, TIBC, IRON, RETICCTPCT in the last 72 hours.  Urine analysis:    Component Value Date/Time   COLORURINE YELLOW 06/26/2021 1234   APPEARANCEUR CLEAR 06/26/2021 1234   LABSPEC 1.015 06/26/2021 1234   PHURINE 6.0 06/26/2021 1234   GLUCOSEU NEGATIVE 06/26/2021 1234   HGBUR TRACE (A) 06/26/2021 1234   BILIRUBINUR NEGATIVE 06/26/2021 1234   KETONESUR NEGATIVE 06/26/2021 1234   PROTEINUR 100 (A) 06/26/2021 1234   UROBILINOGEN 1.0 02/03/2014 0052   NITRITE POSITIVE (A) 06/26/2021 1234   LEUKOCYTESUR TRACE (A) 06/26/2021 1234    Sepsis Labs: Lactic Acid, Venous    Component Value Date/Time   LATICACIDVEN 1.0 06/26/2021 0038    MICROBIOLOGY: Recent Results (from the past 240 hour(s))  Resp Panel by RT-PCR (Flu A&B, Covid)     Status: None   Collection Time: 06/26/21 12:38 AM   Specimen: Nasopharyngeal(NP) swabs in vial transport medium  Result Value Ref Range Status   SARS Coronavirus 2 by RT PCR NEGATIVE NEGATIVE Final    Comment: (NOTE) SARS-CoV-2 target nucleic acids are NOT DETECTED.  The SARS-CoV-2 RNA is generally detectable in upper respiratory specimens during the acute phase of infection. The lowest concentration of SARS-CoV-2 viral copies this assay can detect is 138 copies/mL. A negative result does not preclude SARS-Cov-2 infection and should not be used as the sole basis for treatment or other patient management decisions. A negative result may occur with  improper specimen collection/handling, submission of specimen other than nasopharyngeal swab, presence of viral mutation(s) within the areas targeted by this assay, and inadequate number of viral copies(<138  copies/mL). A negative result must be combined with clinical observations, patient history, and epidemiological information. The expected result is Negative.  Fact Sheet for Patients:  EntrepreneurPulse.com.au  Fact Sheet for Healthcare Providers:  IncredibleEmployment.be  This test is no t yet approved or cleared by the [place] FDA and  has been authorized for detection and/or diagnosis of SARS-CoV-2 by FDA under an Emergency Use Authorization (EUA). This EUA will remain  in effect (meaning this test can be used) for the duration of the COVID-19 declaration under Section 564(b)(1) of the Act, 21 U.S.C.section 360bbb-3(b)(1), unless the authorization is terminated  or revoked sooner.       Influenza A by PCR NEGATIVE NEGATIVE Final   Influenza B by PCR NEGATIVE NEGATIVE Final    Comment: (NOTE) The Xpert Xpress SARS-CoV-2/FLU/RSV plus assay is intended as an aid in the diagnosis of influenza from Nasopharyngeal swab specimens and should not be used as a sole basis for treatment. Nasal washings and aspirates are unacceptable for Xpert Xpress SARS-CoV-2/FLU/RSV testing.  Fact Sheet for Patients: EntrepreneurPulse.com.au  Fact Sheet for Healthcare Providers: IncredibleEmployment.be  This test is not yet approved or cleared by the [place] FDA and has been authorized for detection and/or diagnosis of SARS-CoV-2 by FDA under an Emergency Use Authorization (EUA). This EUA will remain in effect (meaning this test can be used)  for the duration of the COVID-19 declaration under Section 564(b)(1) of the Act, 21 U.S.C. section 360bbb-3(b)(1), unless the authorization is terminated or revoked.  Performed at [facility], [place] [address] [place], [place] [address]   Blood culture (routine x 2)     Status: None (Preliminary result)   Collection Time: 06/26/21 12:50 AM   Specimen: BLOOD  Result  Value Ref Range Status   Specimen Description BLOOD RIGHT HAND  Final   Special Requests   Final    BOTTLES DRAWN AEROBIC AND ANAEROBIC Blood Culture adequate volume   Culture   Final    NO GROWTH 4 DAYS Performed at [facility], [place] [address], [place], [place] [address]    Report Status PENDING  Incomplete  Blood culture (routine x 2)     Status: None (Preliminary result)   Collection Time: 06/26/21 12:55 AM   Specimen: BLOOD  Result Value Ref Range Status   Specimen Description BLOOD LEFT ARM  Final   Special Requests   Final    BOTTLES DRAWN AEROBIC AND ANAEROBIC Blood Culture adequate volume   Culture   Final    NO GROWTH 4 DAYS Performed at [facility], [place] [address], [place], [place] [address]    Report Status PENDING  Incomplete  MRSA Next Gen by PCR, Nasal     Status: Abnormal   Collection Time: 06/26/21  4:47 AM  Result Value Ref Range Status   MRSA by PCR Next Gen DETECTED (A) NOT DETECTED Final    Comment: RESULT CALLED TO, READ BACK BY AND VERIFIED WITH: CHRIS SCHROWE,RN@0639  06/26/21 [place] (NOTE) The GeneXpert MRSA Assay (FDA approved for NASAL specimens only), is one component of a comprehensive MRSA colonization surveillance program. It is not intended to diagnose MRSA infection nor to guide or monitor treatment for MRSA infections. Test performance is not FDA approved in patients less than 46 years old. Performed at [facility], [place] [address], [place], [place] [address]   Respiratory (~20 pathogens) panel by PCR     Status: None   Collection Time: 06/26/21 10:26 AM   Specimen: Nasopharyngeal Swab; Respiratory  Result Value Ref Range Status   Adenovirus NOT DETECTED NOT DETECTED Final   Coronavirus 229E NOT DETECTED NOT DETECTED Final    Comment: (NOTE) The Coronavirus on the Respiratory Panel, DOES NOT test for the novel  Coronavirus (2019 nCoV)    Coronavirus HKU1 NOT DETECTED NOT DETECTED Final   Coronavirus NL63 NOT DETECTED NOT  DETECTED Final   Coronavirus OC43 NOT DETECTED NOT DETECTED Final   Metapneumovirus NOT DETECTED NOT DETECTED Final   Rhinovirus / Enterovirus NOT DETECTED NOT DETECTED Final   Influenza A NOT DETECTED NOT DETECTED Final   Influenza B NOT DETECTED NOT DETECTED Final   Parainfluenza Virus 1 NOT DETECTED NOT DETECTED Final   Parainfluenza Virus 2 NOT DETECTED NOT DETECTED Final   Parainfluenza Virus 3 NOT DETECTED NOT DETECTED Final   Parainfluenza Virus 4 NOT DETECTED NOT DETECTED Final   Respiratory Syncytial Virus NOT DETECTED NOT DETECTED Final   Bordetella pertussis NOT DETECTED NOT DETECTED Final   Bordetella Parapertussis NOT DETECTED NOT DETECTED Final   Chlamydophila pneumoniae NOT DETECTED NOT DETECTED Final   Mycoplasma pneumoniae NOT DETECTED NOT DETECTED Final    Comment: Performed at [facility], [place]. [address], [place], [place] [address]  Urine Culture     Status: Abnormal   Collection Time: 06/26/21 12:34  PM   Specimen: Urine, Clean Catch  Result Value Ref Range Status   Specimen Description URINE, CLEAN CATCH  Final   Special Requests NONE  Final   Culture (A)  Final    >=100,000 COLONIES/mL KLEBSIELLA PNEUMONIAE Confirmed Extended Spectrum Beta-Lactamase Producer (ESBL).  In bloodstream infections from ESBL organisms, carbapenems are preferred over piperacillin/tazobactam. They are shown to have a lower risk of mortality. MULTI-DRUG RESISTANT ORGANISM CRITICAL RESULT CALLED TO, READ BACK BY AND VERIFIED WITH: RN [email or web address] AT 1308 ON 06/29/2021 BY T.SAAD. Performed at [facility], [place] [address] [place], [place] [address]    Report Status 06/29/2021 FINAL  Final   Organism ID, Bacteria KLEBSIELLA PNEUMONIAE (A)  Final      Susceptibility   Klebsiella pneumoniae - MIC*    AMPICILLIN >=32 RESISTANT Resistant     CEFAZOLIN >=64 RESISTANT Resistant     CEFEPIME >=32 RESISTANT Resistant     CEFTRIAXONE >=64 RESISTANT Resistant     CIPROFLOXACIN >=4  RESISTANT Resistant     GENTAMICIN >=16 RESISTANT Resistant     IMIPENEM <=0.25 SENSITIVE Sensitive     NITROFURANTOIN 256 RESISTANT Resistant     TRIMETH/SULFA >=320 RESISTANT Resistant     AMPICILLIN/SULBACTAM >=32 RESISTANT Resistant     PIP/TAZO >=128 RESISTANT Resistant     * >=100,000 COLONIES/mL KLEBSIELLA PNEUMONIAE  Carbapenem Resistance Panel     Status: None   Collection Time: 06/26/21 12:34 PM  Result Value Ref Range Status   Carba Resistance IMP Gene NOT DETECTED NOT DETECTED Final   Carba Resistance VIM Gene NOT DETECTED NOT DETECTED Final   Carba Resistance NDM Gene NOT DETECTED NOT DETECTED Final   Carba Resistance KPC Gene NOT DETECTED NOT DETECTED Final   Carba Resistance OXA48 Gene NOT DETECTED NOT DETECTED Final    Comment: (NOTE) Cepheid Carba-R is an FDA-cleared nucleic acid amplification test  (NAAT)for the detection and differentiation of genes encoding the  most prevalent carbapenemases in bacterial isolate samples. Carbapenemase gene identification and implementation of comprehensive  infection control measures are recommended by the CDC to prevent the  spread of the resistant organisms. Performed at [facility], [place] [address] [place], [place] [address]   Culture, Respiratory w Gram Stain     Status: None   Collection Time: 06/28/21  6:40 PM   Specimen: Tracheal Aspirate  Result Value Ref Range Status   Specimen Description TRACHEAL ASPIRATE  Final   Special Requests NONE  Final   Gram Stain   Final    ABUNDANT WBC PRESENT,BOTH PMN AND MONONUCLEAR FEW YEAST RARE GRAM POSITIVE COCCI    Culture   Final    RARE Consistent with normal respiratory flora. No Pseudomonas species isolated Performed at [place] [address] [place], [place] [address]    Report Status 07/01/2021 FINAL  Final    RADIOLOGY STUDIES/RESULTS: DG Chest Port 1V same Day  Result Date: 07/01/2021 CLINICAL DATA:  Follow-up for shortness of breath/pneumonia.  EXAM: PORTABLE CHEST 1 VIEW COMPARISON:  06/27/2021 and older exams. FINDINGS: Since the most recent prior study, there has been improvement in lung aeration. Bilateral interstitial and airspace opacities have improved. There are persistent opacities, most evident centrally and at the left lung base. No new lung abnormalities. Possible small effusions. No pneumothorax. Cardiac silhouette normal in size. IMPRESSION: Interval improvement in lung aeration with decreased interstitial and airspace lung opacities. Findings consistent with improved multifocal pneumonia or improved edema. Electronically Signed  By: Lajean Manes M.D.   On: 07/01/2021 10:25     LOS: 5 days   Oren Binet, MD  [facility]  To contact the attending provider between 7A-7P or the covering provider during after hours 7P-7A, please log into the web site [email or web address] and access using universal [place] password for that web site. If you do not have the password, please call the hospital operator.  07/01/2021, 3:09 PM

## 2021-07-01 NOTE — Assessment & Plan Note (Addendum)
Probably atypical-however COVID/influenza PCR/respiratory virus panel/sputum/blood cultures are all negative.  Has completed a course of Zithromax-remains on IV Rocephin.  Kindly see above.

## 2021-07-02 DIAGNOSIS — J189 Pneumonia, unspecified organism: Secondary | ICD-10-CM | POA: Diagnosis not present

## 2021-07-02 LAB — BASIC METABOLIC PANEL
Anion gap: 8 (ref 5–15)
BUN: 35 mg/dL — ABNORMAL HIGH (ref 8–23)
CO2: 26 mmol/L (ref 22–32)
Calcium: 8.7 mg/dL — ABNORMAL LOW (ref 8.9–10.3)
Chloride: 101 mmol/L (ref 98–111)
Creatinine, Ser: 1.98 mg/dL — ABNORMAL HIGH (ref 0.61–1.24)
GFR, Estimated: 37 mL/min — ABNORMAL LOW (ref >60)
Glucose, Bld: 212 mg/dL — ABNORMAL HIGH (ref 70–99)
Potassium: 3.9 mmol/L (ref 3.5–5.1)
Sodium: 135 mmol/L (ref 135–145)

## 2021-07-02 LAB — CBC
HCT: 25.3 % — ABNORMAL LOW (ref 39.0–52.0)
Hemoglobin: 7.8 g/dL — ABNORMAL LOW (ref 13.0–17.0)
MCH: 27.7 pg (ref 26.0–34.0)
MCHC: 30.8 g/dL (ref 30.0–36.0)
MCV: 89.7 fL (ref 80.0–100.0)
Platelets: 243 10*3/uL (ref 150–400)
RBC: 2.82 MIL/uL — ABNORMAL LOW (ref 4.22–5.81)
RDW: 15.1 % (ref 11.5–15.5)
WBC: 8.4 10*3/uL (ref 4.0–10.5)
nRBC: 0 % (ref 0.0–0.2)

## 2021-07-02 LAB — GLUCOSE, CAPILLARY
Glucose-Capillary: 131 mg/dL — ABNORMAL HIGH (ref 70–99)
Glucose-Capillary: 145 mg/dL — ABNORMAL HIGH (ref 70–99)
Glucose-Capillary: 176 mg/dL — ABNORMAL HIGH (ref 70–99)
Glucose-Capillary: 184 mg/dL — ABNORMAL HIGH (ref 70–99)
Glucose-Capillary: 276 mg/dL — ABNORMAL HIGH (ref 70–99)
Glucose-Capillary: 279 mg/dL — ABNORMAL HIGH (ref 70–99)

## 2021-07-02 LAB — BRAIN NATRIURETIC PEPTIDE: B Natriuretic Peptide: 57.3 pg/mL (ref 0.0–100.0)

## 2021-07-02 LAB — PROCALCITONIN: Procalcitonin: 0.2 ng/mL

## 2021-07-02 LAB — MAGNESIUM: Magnesium: 1.7 mg/dL (ref 1.7–2.4)

## 2021-07-02 MED ORDER — FENTANYL 50 MCG/HR TD PT72
1.0000 | MEDICATED_PATCH | TRANSDERMAL | Status: DC
Start: 2021-07-02 — End: 2021-07-06
  Administered 2021-07-02 – 2021-07-05 (×3): 1 via TRANSDERMAL
  Filled 2021-07-02 (×3): qty 1

## 2021-07-02 MED ORDER — OXYCODONE HCL 5 MG PO TABS
30.0000 mg | ORAL_TABLET | Freq: Four times a day (QID) | ORAL | Status: DC | PRN
  Administered 2021-07-02 – 2021-07-07 (×20): 30 mg via ORAL
  Filled 2021-07-02 (×20): qty 6

## 2021-07-02 MED ORDER — NALOXONE HCL 0.4 MG/ML IJ SOLN: 0.4000 mg | INTRAMUSCULAR | Status: DC | PRN

## 2021-07-02 NOTE — Evaluation (Signed)
Clinical/Bedside Swallow Evaluation Patient Details  Name: Gregory [name] MRN: [id number] Date of Birth: April 12, 1961  Today's Date: 07/02/2021 Time: SLP Start Time (ACUTE ONLY): 4098 SLP Stop Time (ACUTE ONLY): 1600 SLP Time Calculation (min) (ACUTE ONLY): 15 min  Past Medical History:  Past Medical History:  Diagnosis Date   Acquired absence of left great toe ([place])    Acquired absence of right leg below knee ([place])    Acute osteomyelitis of calcaneum, right ([place]) 10/02/2016   Acute respiratory failure (HCC)    Amputation stump infection ([place]) 08/12/2018   Anemia    Basal cell carcinoma, eyelid    Cancer (HCC)    Candidiasis 08/12/2018   CHF (congestive heart failure) (HCC)    chronic diastolic    Chronic back pain    Chronic kidney disease    stage 3    Chronic multifocal osteomyelitis (HCC)    of ankle and foot    Chronic pain syndrome    COPD (chronic obstructive pulmonary disease) (HCC)    DDD (degenerative disc disease), lumbar    Depression    major depressive disorder    Diabetes mellitus without complication ([place])    type 2    Diabetic ulcer of toe of left foot ([place]) 10/02/2016   Difficulty in walking, not elsewhere classified    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   Foot drop, bilateral    Foot drop, right 12/27/2015   GERD (gastroesophageal reflux disease)    Herpesviral vesicular dermatitis    Hyperlipidemia    Hyperlipidemia    Hypertension    Insomnia    Malignant melanoma of other parts of face ([place])    MRSA bacteremia    Muscle weakness (generalized)    Nasal congestion    Necrosis of toe ([place]) 07/08/2018   Neuromuscular dysfunction of bladder    Osteomyelitis (HCC)    Osteomyelitis (HCC)    Osteomyelitis (HCC)    right BKA   Other idiopathic peripheral autonomic neuropathy    Retention of urine, unspecified    Squamous cell carcinoma of skin    Unsteadiness on feet    Urinary retention    Urinary retention    Urinary tract infection    Wears dentures     Wears glasses    Past Surgical History:  Past Surgical History:  Procedure Laterality Date   AMPUTATION Left 03/29/2017   Procedure: LEFT GREAT TOE AMPUTATION AT METATARSOPHALANGEAL JOINT;  Surgeon: [name], MD;  Location: [place];  Service: Orthopedics;  Laterality: Left;   AMPUTATION Right 03/29/2017   Procedure: RIGHT BELOW KNEE AMPUTATION;  Surgeon: [name], MD;  Location: [place];  Service: Orthopedics;  Laterality: Right;   AMPUTATION Left 06/06/2018   Procedure: LEFT 2ND TOE AMPUTATION;  Surgeon: [name], MD;  Location: [place];  Service: Orthopedics;  Laterality: Left;   AMPUTATION Right 11/19/2018   Procedure: AMPUTATION ABOVE KNEE;  Surgeon: [name], MD;  Location: [place];  Service: Orthopedics;  Laterality: Right;   ANTERIOR CERVICAL CORPECTOMY N/A 11/25/2015   Procedure: ANTERIOR CERVICAL FIVE CORPECTOMY Cervical four - six fusion;  Surgeon: Consuella Lose, MD;  Location: [place];  Service: Neurosurgery;  Laterality: N/A;  ANTERIOR CERVICAL FIVE CORPECTOMY Cervical four - six fusion   APPLICATION OF WOUND VAC Right 10/22/2018   Procedure: Application Of Wound Vac;  Surgeon: [name], MD;  Location: [facility];  Service: Orthopedics;  Laterality: Right;   CYSTOSCOPY WITH RETROGRADE URETHROGRAM N/A 04/25/2018  Procedure: CYSTOSCOPY WITH RETROGRADE URETHROGRAM/ BALLOON DILATION;  Surgeon: Kathie Rhodes, MD;  Location: [facility];  Service: Urology;  Laterality: N/A;   MULTIPLE TOOTH EXTRACTIONS     POSTERIOR CERVICAL FUSION/FORAMINOTOMY N/A 11/29/2015   Procedure: Cervical Three-Cervical Seven Posterior Cervical Laminectomy with Fusion;  Surgeon: Consuella Lose, MD;  Location: [facility];  Service: Neurosurgery;  Laterality: N/A;  Cervical Three-Cervical Seven Posterior Cervical Laminectomy with Fusion   STUMP REVISION Right 10/22/2018   Procedure: REVISION RIGHT BELOW KNEE AMPUTATION;  Surgeon: [name], MD;  Location: [place];  Service: Orthopedics;   Laterality: Right;   STUMP REVISION Right 12/05/2018   Procedure: Revision Right Above Knee Amputation;  Surgeon: [name], MD;  Location: [place];  Service: Orthopedics;  Laterality: Right;   STUMP REVISION Right 05/29/2019   Procedure: REVISION RIGHT ABOVE KNEE AMPUTATION;  Surgeon: [name], MD;  Location: [place] [facility];  Service: Orthopedics;  Laterality: Right;   STUMP REVISION Right 06/24/2019   Procedure: STUMP REVISION;  Surgeon: [name], MD;  Location: [place];  Service: Orthopedics;  Laterality: Right;   HPI:  Patient is a 61 y.o. male with history of DM-2, HTN, chronic HFpEF, s/p right AKA, COPD-who was admitted to [facility] service on 1/9 with acute hypoxic respiratory failure due to community-acquired pneumonia. MD questioning possible underlying aspiration due to patient being on high dose narcotics as well as continuing to be severely hypoxic (on HFNC) prompting this clinical swallow evaluation order.    Assessment / Plan / Recommendation  Clinical Impression  Patient did not present with any clinical s/s of dysphagia as per this bedside swallow evaluation. Voice was clear and strong throughout, oxygen saturations and respiratory rate both WFL. Both patient and RN deny any swallowing difficulties with PO's this date. Patient consumed straw sips of thin liquids (soda) and did not exhibit any overt s/s aspiration or penetration. Swallow initiation appeared timely. SLP is not highly suspicious of silent aspiration and no further skilled ST services warranted at this time. SLP Visit Diagnosis: Dysphagia, unspecified (R13.10)    Aspiration Risk  No limitations;Mild aspiration risk    Diet Recommendation Regular;Thin liquid   Liquid Administration via: Cup;Straw Medication Administration: Whole meds with liquid Supervision: Patient able to self feed Postural Changes: Seated upright at 90 degrees    Other  Recommendations Oral Care Recommendations: Oral care BID    Recommendations  for follow up therapy are one component of a multi-disciplinary discharge planning process, led by the attending physician.  Recommendations may be updated based on patient status, additional functional criteria and insurance authorization.  Follow up Recommendations No SLP follow up      Assistance Recommended at Discharge None  Functional Status Assessment Patient has had a recent decline in their functional status and demonstrates the ability to make significant improvements in function in a reasonable and predictable amount of time.  Frequency and Duration   N/A         Prognosis   N/A     Swallow Study   General Date of Onset: 07/01/21 HPI: Patient is a 61 y.o. male with history of DM-2, HTN, chronic HFpEF, s/p right AKA, COPD-who was admitted to [facility] service on 1/9 with acute hypoxic respiratory failure due to community-acquired pneumonia. MD questioning possible underlying aspiration due to patient being on high dose narcotics as well as continuing to be severely hypoxic (on HFNC) prompting this clinical swallow evaluation order. Type of Study: Bedside Swallow Evaluation Previous Swallow  Assessment: none found Diet Prior to this Study: Regular;Thin liquids Temperature Spikes Noted: No Respiratory Status: Nasal cannula History of Recent Intubation: No Behavior/Cognition: Alert;Cooperative;Pleasant mood Oral Cavity Assessment: Within Functional Limits Oral Care Completed by SLP: No Oral Cavity - Dentition: Edentulous Vision: Functional for self-feeding Self-Feeding Abilities: Able to feed self Patient Positioning: Upright in bed Baseline Vocal Quality: Normal Volitional Cough: Strong Volitional Swallow: Able to elicit    Oral/Motor/Sensory Function Overall Oral Motor/Sensory Function: Within functional limits   Ice Chips     Thin Liquid Thin Liquid: Within functional limits Presentation: Straw;Self Fed    Nectar Thick     Honey Thick     Puree Puree: Not tested    Solid     Solid: Not tested      Sonia Baller, MA, CCC-SLP Speech Therapy

## 2021-07-02 NOTE — Progress Notes (Signed)
PROGRESS NOTE        PATIENT DETAILS Name: Gregory [name] Age: 61 y.o. Sex: male Date of Birth: 1961-04-23 Admit Date: 06/26/2021 Admitting Physician Aldean Jewett, MD EVO:[id number], Brandt Loosen, MD  Brief Narrative:  Patient is a 61 y.o. male with history of DM-2, HTN, chronic HFpEF, s/p right AKA, COPD-who was admitted to [facility] service on 1/9 with acute hypoxic respiratory failure due to community-acquired pneumonia.  Significant events: 1/9>> admit to ICU by PCCM-initially requiring BiPAP. 1/12>> transfer to [facility] on heated high flow  Subjective:  Patient in bed, no headache or chest pain, he claims he is hurting in his right stump and wants more pain medications.  Objective: Vitals: Blood pressure (!) 152/77, pulse 64, temperature 98.3 F (36.8 C), temperature source Oral, resp. rate 13, height 6' (1.829 m), weight 109.1 kg, SpO2 94 %.   Exam:  Awake Alert, No new F.N deficits, Normal affect [place].AT,PERRAL Supple Neck, No JVD,   Symmetrical Chest wall movement, Good air movement bilaterally, CTAB RRR,No Gallops, Rubs or new Murmurs,  +ve B.Sounds, Abd Soft, No tenderness,   S/p right AKA-LLE with lymphedema/chronic skin changes.  Assessment/Plan: Asymptomatic bacteriuria- (present on admission) Urine culture drawn on admission was positive for multidrug-resistant Klebsiella pneumonia-this is felt to be a colonization rather than a infection as patient did not have any UTI symptoms-his presentation is consistent with pneumonia and respiratory failure.  Do not think this needs to be treated at this point.  Normocytic anemia- (present on admission) Probably related to CKD-worsened with acute illness.  Watch closely, and transfuse if Hb <7.  Community acquired pneumonia Probably atypical-however COVID/influenza PCR/respiratory virus panel/sputum/blood cultures are all negative.  Has completed a course of Zithromax-remains on IV Rocephin.  Still requiring a  lot of oxygen, I am also questioning if he has underlying microaspiration due to being on high-dose narcotics, speech will be requested to evaluate.  Acute respiratory failure with hypoxia ([place])- (present on admission) Due to community-acquired pneumonia. Remains severely hypoxic but comfortable-on heated high flow.  Plan is to wean off oxygen as tolerated.  Continue diuretics and attempt to keep patient in a negative balance.  Tobacco use- (present on admission) Continue transdermal nicotine.  Stage 3b chronic kidney disease (CKD) ([place])- (present on admission) Creatinine not far from usual baseline-continue to closely monitor while on diuretics.  We will allow for some mild elevation in creatinine that his usual baseline in an attempt to keep patient on the drier side.   S/P AKA (above knee amputation) unilateral, right ([place]) Monitor stump site.  Lymphedema of left leg On diuretics.  Pressure injury, stage 3 ([place])- (present on admission) Wound care team following  Chronic pain- (present on admission) Continue Neurontin/Cymbalta/Lyrica and oxycodone.  Oxycodone dose increased, fentanyl patch applied upon patient's request, I am concerned about accidental narcotic overdose, I have counseled the patient not to use more narcotics than needed.  Also left a message for his daughter on 07/02/2021.  Type II diabetes mellitus with neurological manifestations ([place])- (present on admission) CBG stable-continue Semglee 10 units daily and SSI, oral hypoglycemic agents on hold  CBG (last 3)  Recent Labs    06/28/21 1940 06/28/21 2344 06/29/21 0342  GLUCAP 198* 211* 139*    HLD (hyperlipidemia)- (present on admission) Continue Crestor   HTN (hypertension)- (present on admission) BP stable-off all antihypertensives-resume when  able.  Chronic diastolic CHF (congestive heart failure) ([place])- (present on admission) Do not think he has decompensated heart failure at this point-has lymphedema at  baseline-remains on scheduled Demadex-continue to maintain negative balance.  Echo on 1/9 with preserved EF.  COPD (chronic obstructive pulmonary disease) ([place])- (present on admission) Not in exacerbation-continue bronchodilators.  Nutrition Status: Nutrition Problem: Increased nutrient needs Etiology: wound healing Signs/Symptoms: estimated needs Interventions: MVI, Prostat, Premier Protein  Obesity: Estimated body mass index is 32.62 kg/m as calculated from the following:   Height as of this encounter: 6' (1.829 m).   Weight as of this encounter: 109.1 kg.    SpO2: 94 % O2 Flow Rate (L/min): 20 L/min FiO2 (%): 45 %   Procedures: None Consults: PCCM DVT Prophylaxis: SQ Heparin Code Status:Full code  Family Communication: [phone] updated on 1/13.   Disposition Plan: Status is: Inpatient  Remains inpatient appropriate because: Severe hypoxia due to CAP-on heated high flow-SNF when more stable.    Diet: Diet Order             Diet Carb Modified Fluid consistency: Thin; Room service appropriate? Yes  Diet effective now                     Antimicrobial agents: Anti-infectives (From admission, onward)    Start     Dose/Rate Route Frequency Ordered Stop   06/29/21 1000  azithromycin (ZITHROMAX) tablet 500 mg        500 mg Oral Daily 06/28/21 0959 07/01/21 0924   06/28/21 0600  vancomycin (VANCOREADY) IVPB 1750 mg/350 mL  Status:  Discontinued        1,750 mg 175 mL/hr over 120 Minutes Intravenous Every 48 hours 06/27/21 0842 06/27/21 0924   06/27/21 1000  cefTRIAXone (ROCEPHIN) 2 g in sodium chloride 0.9 % 100 mL IVPB        2 g 200 mL/hr over 30 Minutes Intravenous Every 24 hours 06/27/21 0924 07/04/21 0959   06/27/21 1000  azithromycin (ZITHROMAX) 500 mg in sodium chloride 0.9 % 250 mL IVPB        500 mg 250 mL/hr over 60 Minutes Intravenous Every 24 hours 06/27/21 0924 06/28/21 1148   06/26/21 1600  ceFEPIme (MAXIPIME) 2 g in sodium  chloride 0.9 % 100 mL IVPB  Status:  Discontinued        2 g 200 mL/hr over 30 Minutes Intravenous Every 12 hours 06/26/21 0438 06/27/21 0924   06/26/21 0438  vancomycin variable dose per unstable renal function (pharmacist dosing)  Status:  Discontinued         Does not apply See admin instructions 06/26/21 0438 06/27/21 0848   06/26/21 0430  vancomycin (VANCOREADY) IVPB 2000 mg/400 mL        2,000 mg 200 mL/hr over 120 Minutes Intravenous  Once 06/26/21 0418 06/26/21 0800   06/26/21 0430  ceFEPIme (MAXIPIME) 2 g in sodium chloride 0.9 % 100 mL IVPB        2 g 200 mL/hr over 30 Minutes Intravenous  Once 06/26/21 0418 06/26/21 0541   06/26/21 0200  cefTRIAXone (ROCEPHIN) 1 g in sodium chloride 0.9 % 100 mL IVPB        1 g 200 mL/hr over 30 Minutes Intravenous  Once 06/26/21 0154 06/26/21 0226   06/26/21 0200  azithromycin (ZITHROMAX) 500 mg in sodium chloride 0.9 % 250 mL IVPB        500 mg 250 mL/hr over 60 Minutes Intravenous  Once 06/26/21  0154 06/26/21 0349        MEDICATIONS: Scheduled Meds:  (feeding supplement) PROSource Plus  30 mL Oral Daily   arformoterol  15 mcg Nebulization BID   budesonide (PULMICORT) nebulizer solution  0.5 mg Nebulization BID   buPROPion  300 mg Oral Daily   chlorhexidine  15 mL Mouth Rinse BID   Chlorhexidine Gluconate Cloth  6 each Topical Daily   docusate sodium  100 mg Oral Daily   DULoxetine  60 mg Oral Daily   fentaNYL  1 patch Transdermal Q72H   gabapentin  300 mg Oral Q8H   guaiFENesin  1,200 mg Oral BID   heparin  5,000 Units Subcutaneous Q8H   insulin aspart  0-20 Units Subcutaneous Q4H   insulin glargine-yfgn  10 Units Subcutaneous Daily   mouth rinse  15 mL Mouth Rinse q12n4p   multivitamin  1 tablet Oral QHS   nicotine  21 mg Transdermal Daily   pantoprazole  40 mg Oral Daily   pregabalin  150 mg Oral BID   Ensure Max Protein  11 oz Oral Daily   rosuvastatin  10 mg Oral QHS   tamsulosin  0.4 mg Oral QPM   torsemide  40 mg Oral  Daily   umeclidinium bromide  1 puff Inhalation Daily   Continuous Infusions:  sodium chloride Stopped (06/28/21 1202)   cefTRIAXone (ROCEPHIN)  IV 2 g (07/02/21 0906)   PRN Meds:.sodium chloride, acetaminophen, albuterol, ALPRAZolam, calcium carbonate, docusate sodium, eszopiclone, oxyCODONE, polyethylene glycol, tiZANidine   I have personally reviewed following labs and imaging studies  LABORATORY DATA:  Recent Labs  Lab 06/26/21 0050 06/26/21 0203 06/27/21 0554 06/28/21 0107 06/30/21 0058 07/01/21 0136 07/02/21 0119  WBC 13.4*  --  10.6* 8.8 8.4 9.8 8.4  HGB 8.8*   < > 7.7* 7.6* 7.2* 7.1* 7.8*  HCT 28.8*   < > 25.9* 25.3* 23.8* 23.8* 25.3*  PLT 240  --  199 186 219 295 243  MCV 92.9  --  94.2 92.7 93.3 91.9 89.7  MCH 28.4  --  28.0 27.8 28.2 27.4 27.7  MCHC 30.6  --  29.7* 30.0 30.3 29.8* 30.8  RDW 16.2*  --  16.0* 16.0* 16.0* 15.7* 15.1  LYMPHSABS 0.9  --   --  1.6  --   --   --   MONOABS 1.1*  --   --  0.9  --   --   --   EOSABS 0.1  --   --  0.4  --   --   --   BASOSABS 0.1  --   --  0.0  --   --   --    < > = values in this interval not displayed.    Recent Labs  Lab  0000 06/26/21 0038 06/26/21 0050 06/26/21 0130 06/26/21 0203 06/26/21 6333 06/27/21 0554 06/28/21 0107 06/29/21 0140 06/30/21 0058 07/01/21 0136 07/02/21 0119  NA  --   --  139  --    < >  --  137 139 136 136 139 135  K  --   --  4.7  --    < >  --  4.4 4.3 3.9 4.3 4.9 3.9  CL   < >  --  106  --   --   --  104 104 102 106 102 101  CO2   < >  --  25  --   --   --  23 24 25 25 26  26  GLUCOSE   < >  --  195*  --   --   --  153* 134* 164* 176* 128* 212*  BUN   < >  --  33*  --   --   --  33* 33* 34* 35* 36* 35*  CREATININE   < >  --  2.28*  --   --   --  2.44* 2.42* 2.50* 2.52* 2.14* 1.98*  CALCIUM   < >  --  8.3*  --   --   --  8.1* 8.5* 8.3* 8.2* 8.8* 8.7*  AST  --   --  12*  --   --   --   --  19 27 24   --   --   ALT  --   --  10  --   --   --   --  12 23 26   --   --   ALKPHOS  --   --   84  --   --   --   --  85 133* 139*  --   --   BILITOT  --   --  0.7  --   --   --   --  0.5 0.2* 0.4  --   --   ALBUMIN  --   --  3.0*  --   --   --   --  2.2* 2.1* 2.2*  --   --   MG  --   --   --   --   --   --  1.8  --   --   --   --  1.7  PROCALCITON  --   --   --   --   --  0.91  --   --   --   --   --   --   LATICACIDVEN  --  1.0  --   --   --   --   --   --   --   --   --   --   HGBA1C  --   --   --   --   --  7.2*  --   --   --   --   --   --   BNP  --   --   --  182.7*  --   --   --   --   --   --   --  57.3   < > = values in this interval not displayed.         RADIOLOGY STUDIES/RESULTS: DG Chest Port 1V same Day  Result Date: 07/01/2021 CLINICAL DATA:  Follow-up for shortness of breath/pneumonia. EXAM: PORTABLE CHEST 1 VIEW COMPARISON:  06/27/2021 and older exams. FINDINGS: Since the most recent prior study, there has been improvement in lung aeration. Bilateral interstitial and airspace opacities have improved. There are persistent opacities, most evident centrally and at the left lung base. No new lung abnormalities. Possible small effusions. No pneumothorax. Cardiac silhouette normal in size. IMPRESSION: Interval improvement in lung aeration with decreased interstitial and airspace lung opacities. Findings consistent with improved multifocal pneumonia or improved edema. Electronically Signed   By: Lajean Manes M.D.   On: 07/01/2021 10:25     LOS: 6 days   Signature  Lala Lund M.D on 07/02/2021 at 11:53 AM   -  To page go to [email or web address]

## 2021-07-03 ENCOUNTER — Inpatient Hospital Stay (HOSPITAL_COMMUNITY): Payer: Medicaid Other

## 2021-07-03 DIAGNOSIS — J189 Pneumonia, unspecified organism: Secondary | ICD-10-CM | POA: Diagnosis not present

## 2021-07-03 DIAGNOSIS — K6389 Other specified diseases of intestine: Secondary | ICD-10-CM

## 2021-07-03 LAB — GLUCOSE, CAPILLARY
Glucose-Capillary: 206 mg/dL — ABNORMAL HIGH (ref 70–99)
Glucose-Capillary: 172 mg/dL — ABNORMAL HIGH (ref 70–99)
Glucose-Capillary: 169 mg/dL — ABNORMAL HIGH (ref 70–99)
Glucose-Capillary: 214 mg/dL — ABNORMAL HIGH (ref 70–99)

## 2021-07-03 MED ORDER — PROCHLORPERAZINE EDISYLATE 10 MG/2ML IJ SOLN
10.0000 mg | Freq: Four times a day (QID) | INTRAMUSCULAR | Status: DC | PRN
  Administered 2021-07-03 – 2021-07-05 (×4): 10 mg via INTRAVENOUS
  Filled 2021-07-03 (×5): qty 2

## 2021-07-03 MED ORDER — NALOXEGOL OXALATE 12.5 MG PO TABS
12.5000 mg | ORAL_TABLET | Freq: Every day | ORAL | Status: DC
  Administered 2021-07-03 – 2021-07-07 (×5): 12.5 mg via ORAL
  Filled 2021-07-03 (×5): qty 1

## 2021-07-03 MED ORDER — INSULIN ASPART 100 UNIT/ML IJ SOLN
0.0000 [IU] | Freq: Every day | INTRAMUSCULAR | Status: DC
  Administered 2021-07-03 – 2021-07-05 (×2): 3 [IU] via SUBCUTANEOUS
  Administered 2021-07-06: 4 [IU] via SUBCUTANEOUS

## 2021-07-03 MED ORDER — ONDANSETRON HCL 4 MG/2ML IJ SOLN
4.0000 mg | Freq: Four times a day (QID) | INTRAMUSCULAR | Status: DC | PRN
  Administered 2021-07-03 (×2): 4 mg via INTRAVENOUS
  Filled 2021-07-03 (×2): qty 2

## 2021-07-03 MED ORDER — POLYETHYLENE GLYCOL 3350 17 G PO PACK
17.0000 g | PACK | Freq: Every day | ORAL | Status: DC
  Administered 2021-07-03: 17 g via ORAL
  Filled 2021-07-03 (×3): qty 1

## 2021-07-03 MED ORDER — DOCUSATE SODIUM 100 MG PO CAPS
200.0000 mg | ORAL_CAPSULE | Freq: Two times a day (BID) | ORAL | Status: DC
  Administered 2021-07-03 – 2021-07-07 (×5): 200 mg via ORAL
  Filled 2021-07-03 (×8): qty 2

## 2021-07-03 MED ORDER — INSULIN ASPART 100 UNIT/ML IJ SOLN
0.0000 [IU] | Freq: Three times a day (TID) | INTRAMUSCULAR | Status: DC
  Administered 2021-07-03: 3 [IU] via SUBCUTANEOUS
  Administered 2021-07-04 (×2): 5 [IU] via SUBCUTANEOUS
  Administered 2021-07-04: 7 [IU] via SUBCUTANEOUS
  Administered 2021-07-05: 2 [IU] via SUBCUTANEOUS
  Administered 2021-07-05 – 2021-07-06 (×4): 3 [IU] via SUBCUTANEOUS
  Administered 2021-07-06: 2 [IU] via SUBCUTANEOUS
  Administered 2021-07-07: 3 [IU] via SUBCUTANEOUS
  Administered 2021-07-07: 2 [IU] via SUBCUTANEOUS

## 2021-07-03 MED ORDER — BENZOCAINE-MENTHOL 20-0.5 % EX AERO
1.0000 "application " | INHALATION_SPRAY | Freq: Three times a day (TID) | CUTANEOUS | Status: DC | PRN
  Administered 2021-07-03 – 2021-07-07 (×6): 1 via TOPICAL
  Filled 2021-07-03: qty 56

## 2021-07-03 NOTE — Progress Notes (Signed)
Inpatient Diabetes Program Recommendations  AACE/ADA: New Consensus Statement on Inpatient Glycemic Control   Target Ranges:  Prepandial:   less than 140 mg/dL      Peak postprandial:   less than 180 mg/dL (1-2 hours)      Critically ill patients:  140 - 180 mg/dL    Latest Reference Range & Units 07/03/21 05:14 07/03/21 08:25  Glucose-Capillary 70 - 99 mg/dL 206 (H) 172 (H)    Latest Reference Range & Units 07/02/21 08:24 07/02/21 11:51 07/02/21 15:57 07/02/21 20:29 07/02/21 23:58  Glucose-Capillary 70 - 99 mg/dL 145 (H) 176 (H) 184 (H) 279 (H) 276 (H)   Review of Glycemic Control  Diabetes history: DM2 Outpatient Diabetes medications: Lantus 14 units daily, Humalog 0-10 units TID with meals, Glipizide 10 mg daily Current orders for Inpatient glycemic control: Semglee 10 units daily, Novolog 0-20 units Q4H  Inpatient Diabetes Program Recommendations:    Insulin: In reviewing chart,noted Semglee was NOT GIVEN on 07/02/21 (charted as not given, med not available). As a result, glucose more elevated. Patient already given Semglee 10 units this morning. May want to consider adding meal coverage insulin if post prandial glucose is consistently over 180 mg/dl and patient is eating at least 50% of meals.  Thanks, Barnie Alderman, RN, MSN, CDE Diabetes Coordinator Inpatient Diabetes Program [phone] (Team Pager from 8am to 5pm)

## 2021-07-03 NOTE — Assessment & Plan Note (Signed)
With nausea vomiting on 07/03/2021, abdominal x-ray shows large amount of stool, placed on Movantik, bowel regimen made more aggressive.  Counseled against excessive narcotic use again.

## 2021-07-03 NOTE — Progress Notes (Signed)
Nutrition Follow-up  DOCUMENTATION CODES:   Not applicable  INTERVENTION:   Discontinue Ensure Max po daily, each supplement provides 150 kcal and 30 grams of protein.  Continue ProSource Plus po Daily, each supplement provides 100 kcal and 15 grams of protein Double proteins with each meal Continue Renal MVI daily  NUTRITION DIAGNOSIS:   Increased nutrient needs related to wound healing as evidenced by estimated needs. - Ongoing  GOAL:   Patient will meet greater than or equal to 90% of their needs - Progressing   MONITOR:   PO intake, Supplement acceptance, Labs, Skin  REASON FOR ASSESSMENT:   Consult Wound healing  ASSESSMENT:   61 year old male who presented to the ED on 1/09 with fever, cough, SOB. PMH of COPD, CHF, T2DM, HTN, HLD, osteomyelitis, previous R AKA, chronic LLE wounds, CKD stage III. Pt admitted with CAP.  Pt reports that he has not been great today, states that he has been throwing up this morning. Pt reports that he has received medications to help, states that it has helped some. Pt denies any difficulty with ordering meals.  Per EMR, pt intake includes: 01/10: Breakfast 75%, Lunch 100%, Dinner 100% 01/11: Lunch 100%, Dinner 100% 01/12: Breakfast 95%  Pt reports that he does not like the Ensure; RD will discontinue. Pt reports that he is doing ok with the ProSource.   Pt with sliding scale insulin ordered q4h. Pt on PO diet since 01/09; reached out to MD to change.  Medications reviewed and include: Colace, SSI 0-20 units q4h, Semglee 10 units, Rena-Vit, Protonix, Torsemide, IV antibiotics Labs reviewed: 24 hr CBG [id number]  Diet Order:   Diet Order             Diet Carb Modified Fluid consistency: Thin; Room service appropriate? Yes  Diet effective now                   EDUCATION NEEDS:   No education needs have been identified at this time  Skin:  Skin Assessment: Skin Integrity Issues: Stage III: L buttocks Other: hyperkeratotic  tissue L knee and L ankle  Last BM:  01/12  Height:   Ht Readings from Last 1 Encounters:  06/26/21 6' (1.829 m)    Weight:   Wt Readings from Last 1 Encounters:  07/02/21 109.1 kg    BMI:  Body mass index is 32.62 kg/m.  Estimated Nutritional Needs:   Kcal:  2300-2500  Protein:  120-135 grams  Fluid:  >/= 2.0 L    Gregory [name], RD, LDN Clinical Dietitian See [facility] for contact information.

## 2021-07-03 NOTE — Progress Notes (Signed)
PROGRESS NOTE        PATIENT DETAILS Name: Gregory [name] Age: 61 y.o. Sex: male Date of Birth: 1960/12/26 Admit Date: 06/26/2021 Admitting Physician Aldean Jewett, MD [id number], Brandt Loosen, MD  Brief Narrative:  Patient is a 61 y.o. male with history of DM-2, HTN, chronic HFpEF, s/p right AKA, COPD-who was admitted to [facility] service on 1/9 with acute hypoxic respiratory failure due to community-acquired pneumonia.  Significant events: 1/9>> admit to ICU by PCCM-initially requiring BiPAP. 1/12>> transfer to [facility] on heated high flow  Subjective:  Patient in bed, appears comfortable, denies any headache, no fever, no chest pain or pressure, no shortness of breath , no abdominal pain. No new focal weakness.   Objective: Vitals: Blood pressure (!) 146/60, pulse 60, temperature 97.6 F (36.4 C), temperature source Oral, resp. rate 12, height 6' (1.829 m), weight 109.1 kg, SpO2 93 %.   Exam:  Awake Alert, No new F.N deficits, Normal affect Frost.AT,PERRAL Supple Neck, No JVD,   Symmetrical Chest wall movement, Good air movement bilaterally, CTAB RRR,No Gallops, Rubs or new Murmurs,  +ve B.Sounds, Abd Soft, No tenderness,   S/p right AKA-LLE with lymphedema/chronic skin changes.   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  Counseled against excessive narcotic use again.  Asymptomatic bacteriuria- (present on admission) Urine culture drawn on admission was positive for multidrug-resistant Klebsiella pneumonia-this is felt to be a colonization rather than a infection as patient did not have any UTI symptoms-his presentation is consistent with pneumonia and respiratory failure.  Do not think this needs to be treated at this point.  Normocytic anemia- (present on admission) Probably related to CKD-worsened with acute illness.   Watch closely, and transfuse if Hb <7.  Community acquired pneumonia Probably atypical-however COVID/influenza PCR/respiratory virus panel/sputum/blood cultures are all negative.  Has completed a course of Zithromax-remains on IV Rocephin.  Still requiring a lot of oxygen, I am also questioning if he has underlying microaspiration due to being on high-dose narcotics, speech will be requested to evaluate.  Acute respiratory failure with hypoxia ([place])- (present on admission) Due to community-acquired pneumonia. Remains severely hypoxic but comfortable-on heated high flow.  Plan is to wean off oxygen as tolerated.  Continue diuretics and attempt to keep patient in a negative balance.  We will get a CT chest to evaluate his lungs as his oxygen demand is quite high.  Speech therapy also following, no dysphagia but I am wondering if he is intermittently having nausea vomiting due to narcotic bowel and aspirating some during his emesis episodes..  Tobacco use- (present on admission) Continue transdermal nicotine.  Stage 3b chronic kidney disease (CKD) ([place])- (present on admission) Creatinine not far from usual baseline-continue to closely monitor while on diuretics.  We will allow for some mild elevation in creatinine that his usual baseline in an attempt to keep patient on the drier side.   S/P AKA (above knee amputation) unilateral, right ([place]) Monitor stump site.  Lymphedema of left leg On diuretics.  Pressure injury, stage 3 ([place])- (present on admission) Wound care team following  Chronic pain- (present on admission) Continue Neurontin/Cymbalta/Lyrica and oxycodone.  Oxycodone dose increased, fentanyl patch applied upon patient's request, I am concerned about accidental narcotic overdose, I have counseled the  patient not to use more narcotics than needed.  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  Patient has been extensively counseled.  Type II diabetes mellitus with neurological manifestations ([place])- (present on admission) CBG stable-continue Semglee 10 units daily and SSI, oral hypoglycemic agents on hold  CBG (last 3)  Recent Labs    06/28/21 1940 06/28/21 2344 06/29/21 0342  GLUCAP 198* 211* 139*    HLD (hyperlipidemia)- (present on admission) Continue Crestor   HTN (hypertension)- (present on admission) BP stable-off all antihypertensives-resume when able.  Chronic diastolic CHF (congestive heart failure) ([place])- (present on admission) Do not think he has decompensated heart failure at this point-has lymphedema at baseline-remains on scheduled Demadex-continue to maintain negative balance.  Echo on 1/9 with preserved EF.  COPD (chronic obstructive pulmonary disease) ([place])- (present on admission) Not in exacerbation-continue bronchodilators.  Nutrition Status: Nutrition Problem: Increased nutrient needs Etiology: wound healing Signs/Symptoms: estimated needs Interventions: MVI, Prostat, Premier Protein  Obesity: Estimated body mass index is 32.62 kg/m as calculated from the following:   Height as of this encounter: 6' (1.829 m).   Weight as of this encounter: 109.1 kg.    SpO2: 93 % O2 Flow Rate (L/min): 20 L/min FiO2 (%): 40 %   Procedures: None Consults: PCCM DVT Prophylaxis: SQ Heparin Code Status:Full code  Family Communication: Daughter-[name]-[phone] updated on 07/03/21   Disposition Plan: Status is: Inpatient  Remains inpatient appropriate because: Severe hypoxia due to CAP-on heated high flow-SNF when more stable.    Diet: Diet Order             Diet Carb Modified Fluid consistency: Thin; Room service appropriate? Yes  Diet effective now                     Antimicrobial agents: Anti-infectives (From admission, onward)    Start     Dose/Rate Route Frequency Ordered Stop    06/29/21 1000  azithromycin (ZITHROMAX) tablet 500 mg        500 mg Oral Daily 06/28/21 0959 07/01/21 0924   06/28/21 0600  vancomycin (VANCOREADY) IVPB 1750 mg/350 mL  Status:  Discontinued        1,750 mg 175 mL/hr over 120 Minutes Intravenous Every 48 hours 06/27/21 0842 06/27/21 0924   06/27/21 1000  cefTRIAXone (ROCEPHIN) 2 g in sodium chloride 0.9 % 100 mL IVPB        2 g 200 mL/hr over 30 Minutes Intravenous Every 24 hours 06/27/21 0924 07/03/21 1102   06/27/21 1000  azithromycin (ZITHROMAX) 500 mg in sodium chloride 0.9 % 250 mL IVPB        500 mg 250 mL/hr over 60 Minutes Intravenous Every 24 hours 06/27/21 0924 06/28/21 1148   06/26/21 1600  ceFEPIme (MAXIPIME) 2 g in sodium chloride 0.9 % 100 mL IVPB  Status:  Discontinued        2 g 200 mL/hr over 30 Minutes Intravenous Every 12 hours 06/26/21 0438 06/27/21 0924   06/26/21 0438  vancomycin variable dose per unstable renal function (pharmacist dosing)  Status:  Discontinued         Does not apply See admin instructions 06/26/21 0438 06/27/21 0848   06/26/21 0430  vancomycin (VANCOREADY) IVPB 2000 mg/400 mL        2,000 mg 200 mL/hr over 120 Minutes Intravenous  Once 06/26/21  3220 06/26/21 0800   06/26/21 0430  ceFEPIme (MAXIPIME) 2 g in sodium chloride 0.9 % 100 mL IVPB        2 g 200 mL/hr over 30 Minutes Intravenous  Once 06/26/21 0418 06/26/21 0541   06/26/21 0200  cefTRIAXone (ROCEPHIN) 1 g in sodium chloride 0.9 % 100 mL IVPB        1 g 200 mL/hr over 30 Minutes Intravenous  Once 06/26/21 0154 06/26/21 0226   06/26/21 0200  azithromycin (ZITHROMAX) 500 mg in sodium chloride 0.9 % 250 mL IVPB        500 mg 250 mL/hr over 60 Minutes Intravenous  Once 06/26/21 0154 06/26/21 0349        MEDICATIONS: Scheduled Meds:  (feeding supplement) PROSource Plus  30 mL Oral Daily   arformoterol  15 mcg Nebulization BID   budesonide (PULMICORT) nebulizer solution  0.5 mg Nebulization BID   buPROPion  300 mg Oral Daily    chlorhexidine  15 mL Mouth Rinse BID   Chlorhexidine Gluconate Cloth  6 each Topical Daily   docusate sodium  200 mg Oral BID   DULoxetine  60 mg Oral Daily   fentaNYL  1 patch Transdermal Q72H   gabapentin  300 mg Oral Q8H   guaiFENesin  1,200 mg Oral BID   heparin  5,000 Units Subcutaneous Q8H   insulin aspart  0-5 Units Subcutaneous QHS   insulin aspart  0-9 Units Subcutaneous TID WC   insulin glargine-yfgn  10 Units Subcutaneous Daily   mouth rinse  15 mL Mouth Rinse q12n4p   multivitamin  1 tablet Oral QHS   naloxegol oxalate  25 mg Oral Daily   nicotine  21 mg Transdermal Daily   pantoprazole  40 mg Oral Daily   polyethylene glycol  17 g Oral Daily   pregabalin  150 mg Oral BID   Ensure Max Protein  11 oz Oral Daily   rosuvastatin  10 mg Oral QHS   tamsulosin  0.4 mg Oral QPM   torsemide  40 mg Oral Daily   umeclidinium bromide  1 puff Inhalation Daily   Continuous Infusions:  sodium chloride Stopped (06/28/21 1202)   PRN Meds:.sodium chloride, acetaminophen, albuterol, ALPRAZolam, calcium carbonate, eszopiclone, naLOXone (NARCAN)  injection, ondansetron (ZOFRAN) IV, oxyCODONE, tiZANidine   I have personally reviewed following labs and imaging studies  LABORATORY DATA:  Recent Labs  Lab 06/27/21 0554 06/28/21 0107 06/30/21 0058 07/01/21 0136 07/02/21 0119  WBC 10.6* 8.8 8.4 9.8 8.4  HGB 7.7* 7.6* 7.2* 7.1* 7.8*  HCT 25.9* 25.3* 23.8* 23.8* 25.3*  PLT 199 186 219 295 243  MCV 94.2 92.7 93.3 91.9 89.7  MCH 28.0 27.8 28.2 27.4 27.7  MCHC 29.7* 30.0 30.3 29.8* 30.8  RDW 16.0* 16.0* 16.0* 15.7* 15.1  LYMPHSABS  --  1.6  --   --   --   MONOABS  --  0.9  --   --   --   EOSABS  --  0.4  --   --   --   BASOSABS  --  0.0  --   --   --     Recent Labs  Lab 06/27/21 0554 06/28/21 0107 06/29/21 0140 06/30/21 0058 07/01/21 0136 07/02/21 0119 07/02/21 0131  NA 137 139 136 136 139 135  --   K 4.4 4.3 3.9 4.3 4.9 3.9  --   CL 104 104 102 106 102 101  --   CO2  23 24 25  25  26 26  --   GLUCOSE 153* 134* 164* 176* 128* 212*  --   BUN 33* 33* 34* 35* 36* 35*  --   CREATININE 2.44* 2.42* 2.50* 2.52* 2.14* 1.98*  --   CALCIUM 8.1* 8.5* 8.3* 8.2* 8.8* 8.7*  --   AST  --  19 27 24   --   --   --   ALT  --  12 23 26   --   --   --   ALKPHOS  --  85 133* 139*  --   --   --   BILITOT  --  0.5 0.2* 0.4  --   --   --   ALBUMIN  --  2.2* 2.1* 2.2*  --   --   --   MG 1.8  --   --   --   --  1.7  --   PROCALCITON  --   --   --   --   --   --  0.20  BNP  --   --   --   --   --  57.3  --      RADIOLOGY STUDIES/RESULTS: DG Chest Port 1 View  Result Date: 07/03/2021 CLINICAL DATA:  Shortness of breath EXAM: PORTABLE CHEST 1 VIEW COMPARISON:  07/01/2021 FINDINGS: Cardiomegaly. Slightly improved diffuse bilateral interstitial pulmonary opacity pulmonary vascular prominence. No new or focal airspace opacity. IMPRESSION: Slightly improved diffuse bilateral interstitial pulmonary opacity and pulmonary vascular prominence, consistent with improved edema. No new or focal airspace opacity. Electronically Signed   By: Delanna Ahmadi M.D.   On: 07/03/2021 08:14   DG Abd Portable 1V  Result Date: 07/03/2021 CLINICAL DATA:  Nausea and vomiting. EXAM: PORTABLE ABDOMEN - 1 VIEW COMPARISON:  04/25/2018 FINDINGS: There is a moderate stool burden identified within the colon and rectum. No dilated small bowel loops identified. No signs of obstruction. IMPRESSION: Moderate stool burden within the colon and rectum compatible with constipation. Electronically Signed   By: Kerby Moors M.D.   On: 07/03/2021 11:01     LOS: 7 days   Signature  Lala Lund M.D on 07/03/2021 at 11:31 AM   -  To page go to [email or web address]

## 2021-07-03 NOTE — Progress Notes (Signed)
PT Cancellation Note  Patient Details Name: Gregory [name] MRN: [id number] DOB: February 04, 1961   Cancelled Treatment:    Reason Eval/Treat Not Completed: Medical issues which prohibited therapy  Patient reports he has been "throwing up all day" and remains nauseated. Requested to defer PT until tomorrow.    Arby Barrette, PT Acute Rehabilitation Services  Pager [phone] Office [phone]   Rexanne Mano 07/03/2021, 3:51 PM

## 2021-07-03 NOTE — Progress Notes (Signed)
CT called to send for pt to have CT of chest completed. Pt refused stating he feels "too nauseous" to go down for a CT right now. Nurse will ask pt again shortly and pass onto oncoming nurse.

## 2021-07-03 NOTE — Progress Notes (Signed)
OT Cancellation Note  Patient Details Name: Gregory [name] MRN: [id number] DOB: May 01, 1961   Cancelled Treatment:    Reason Eval/Treat Not Completed: Other (comment) (pt with N/V this a.m RN aware and giving meds). OT will follow up next available time   Britt Bottom 07/03/2021, 10:36 AM

## 2021-07-04 ENCOUNTER — Inpatient Hospital Stay (HOSPITAL_COMMUNITY): Payer: Medicaid Other

## 2021-07-04 DIAGNOSIS — J189 Pneumonia, unspecified organism: Secondary | ICD-10-CM | POA: Diagnosis not present

## 2021-07-04 LAB — PROCALCITONIN: Procalcitonin: <0.1 ng/mL

## 2021-07-04 LAB — CBC
HCT: 30.4 % — ABNORMAL LOW (ref 39.0–52.0)
Hemoglobin: 9.3 g/dL — ABNORMAL LOW (ref 13.0–17.0)
MCH: 27.6 pg (ref 26.0–34.0)
MCHC: 30.6 g/dL (ref 30.0–36.0)
MCV: 90.2 fL (ref 80.0–100.0)
Platelets: 311 10*3/uL (ref 150–400)
RBC: 3.37 MIL/uL — ABNORMAL LOW (ref 4.22–5.81)
RDW: 15.4 % (ref 11.5–15.5)
WBC: 8.6 10*3/uL (ref 4.0–10.5)
nRBC: 0 % (ref 0.0–0.2)

## 2021-07-04 LAB — COMPREHENSIVE METABOLIC PANEL
ALT: 49 U/L — ABNORMAL HIGH (ref 0–44)
AST: 33 U/L (ref 15–41)
Albumin: 2.4 g/dL — ABNORMAL LOW (ref 3.5–5.0)
Alkaline Phosphatase: 125 U/L (ref 38–126)
Anion gap: 9 (ref 5–15)
BUN: 37 mg/dL — ABNORMAL HIGH (ref 8–23)
CO2: 30 mmol/L (ref 22–32)
Calcium: 9.1 mg/dL (ref 8.9–10.3)
Chloride: 98 mmol/L (ref 98–111)
Creatinine, Ser: 1.91 mg/dL — ABNORMAL HIGH (ref 0.61–1.24)
GFR, Estimated: 39 mL/min — ABNORMAL LOW (ref >60)
Glucose, Bld: 217 mg/dL — ABNORMAL HIGH (ref 70–99)
Potassium: 4 mmol/L (ref 3.5–5.1)
Sodium: 137 mmol/L (ref 135–145)
Total Bilirubin: 0.5 mg/dL (ref 0.3–1.2)
Total Protein: 6.9 g/dL (ref 6.5–8.1)

## 2021-07-04 LAB — GLUCOSE, CAPILLARY
Glucose-Capillary: 182 mg/dL — ABNORMAL HIGH (ref 70–99)
Glucose-Capillary: 264 mg/dL — ABNORMAL HIGH (ref 70–99)
Glucose-Capillary: 268 mg/dL — ABNORMAL HIGH (ref 70–99)
Glucose-Capillary: 283 mg/dL — ABNORMAL HIGH (ref 70–99)
Glucose-Capillary: 298 mg/dL — ABNORMAL HIGH (ref 70–99)
Glucose-Capillary: 307 mg/dL — ABNORMAL HIGH (ref 70–99)

## 2021-07-04 LAB — C-REACTIVE PROTEIN: CRP: 2 mg/dL — ABNORMAL HIGH (ref <1.0)

## 2021-07-04 LAB — BRAIN NATRIURETIC PEPTIDE: B Natriuretic Peptide: 13.4 pg/mL (ref 0.0–100.0)

## 2021-07-04 LAB — MAGNESIUM: Magnesium: 1.7 mg/dL (ref 1.7–2.4)

## 2021-07-04 MED ORDER — PROCHLORPERAZINE EDISYLATE 10 MG/2ML IJ SOLN: 10.0000 mg | Freq: Once | INTRAMUSCULAR | Status: DC

## 2021-07-04 NOTE — Progress Notes (Signed)
Made pt aware that CT scan is still ordered and the importance of him having it completed. Pt stated he was in too much pain right now to have scan completed and that "maybe" he will have it done later today.

## 2021-07-04 NOTE — Progress Notes (Signed)
Inpatient Diabetes Program Recommendations  AACE/ADA: New Consensus Statement on Inpatient Glycemic Control (2015)  Target Ranges:  Prepandial:   less than 140 mg/dL      Peak postprandial:   less than 180 mg/dL (1-2 hours)      Critically ill patients:  140 - 180 mg/dL   Lab Results  Component Value Date   GLUCAP 307 (H) 07/04/2021   HGBA1C 7.2 (H) 06/26/2021    Review of Glycemic Control  Latest Reference Range & Units 07/04/21 05:42 07/04/21 07:43 07/04/21 12:06  Glucose-Capillary 70 - 99 mg/dL 268 (H) 283 (H) 307 (H)  (H): Data is abnormally high  Diabetes history: DM2 Outpatient Diabetes medications:  Lantus 14 units QD, Humalog 0-10 units TID, Glucotrol 10 mg QD Current orders for Inpatient glycemic control:  Semglee 10 units QD, Novolog 0-9 units TID and 0-5 units QHS   Inpatient Diabetes Program Recommendations:    Might consider Semglee 14 units QD (home dose) Novolog 3-4 units TID with meals if eats at least 50%  Will continue to follow while inpatient.  Thank you, Reche Dixon, MSN, RN Diabetes Coordinator Inpatient Diabetes Program [phone] (team pager from 8a-5p)

## 2021-07-04 NOTE — Progress Notes (Signed)
PROGRESS NOTE        PATIENT DETAILS Name: Gregory [name] Age: 61 y.o. Sex: male Date of Birth: 1960-08-10 Admit Date: 06/26/2021 Admitting Physician Aldean Jewett, MD [id number], Brandt Loosen, MD  Brief Narrative:  Patient is a 61 y.o. male with history of DM-2, HTN, chronic HFpEF, s/p right AKA, COPD-who was admitted to [facility] service on 1/9 with acute hypoxic respiratory failure due to community-acquired pneumonia.  Significant events: 1/9>> admit to ICU by PCCM-initially requiring BiPAP. 1/12>> transfer to [facility] on heated high flow  Subjective: Seen in bed watching TV in no distress, denies any headache or chest pain or shortness of breath, does have cough, still asking for more pain medicines for his right stump pain.   Objective: Vitals: Blood pressure 138/72, pulse 69, temperature (!) 97.3 F (36.3 C), temperature source Oral, resp. rate 15, height 6' (1.829 m), weight 109.1 kg, SpO2 93 %.   Exam:  Awake Alert, No new F.N deficits, in no distress whatsoever [name].AT,PERRAL Supple Neck, No JVD,   Symmetrical Chest wall movement, Good air movement bilaterally, CTAB RRR,No Gallops, Rubs or new Murmurs,  +ve B.Sounds, Abd Soft, No tenderness,    S/p right AKA-LLE with lymphedema/chronic skin changes.   Assessment/Plan: Chronic pain- (present on admission) UpContinue Neurontin/Cymbalta/Lyrica and oxycodone.  Oxycodone dose increased, fentanyl patch applied upon patient's request, I am concerned about accidental narcotic overdose, I have counseled the patient not to use more narcotics than needed.  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  Patient has been extensively counseled.  He continues to refuse lab work and CT scan despite being counseled multiple times, he is comfortable at all times but still calling staff  every 15 to 20 minutes and asking for more narcotics.  He has narcotic bowel full of stool and I am highly concerned about an accidental overdose, ongoing microaspiration due to narcotic bowel and nausea vomiting.  I have expressed my concerns clearly to patient and daughter, patient has been warned that this kind of behavior can lead to accidental overdose, narcotic bowel, continued nausea vomiting, worsening of pneumonia and even death.  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  Counseled against excessive narcotic use again.  Asymptomatic bacteriuria- (present on admission) Urine culture drawn on admission was positive for multidrug-resistant Klebsiella pneumonia-this is felt to be a colonization rather than a infection as patient did not have any UTI symptoms-his presentation is consistent with pneumonia and respiratory failure.  Do not think this needs to be treated at this point.  Normocytic anemia- (present on admission) Probably related to CKD-worsened with acute illness.  Watch closely, and transfuse if Hb <7.  Community acquired pneumonia Probably atypical-however COVID/influenza PCR/respiratory virus panel/sputum/blood cultures are all negative.  Has completed a course of Zithromax-remains on IV Rocephin.  Still requiring a lot of oxygen, I am also questioning if he has underlying microaspiration due to being on high-dose narcotics, speech will be requested to evaluate.  Acute respiratory failure with hypoxia ([place])- (present on admission) Due to community-acquired pneumonia. Remains severely hypoxic but comfortable-on heated high flow.  Plan is to wean off oxygen  as tolerated.  Continue diuretics and attempt to keep patient in a negative balance.  We will get a CT chest to evaluate his lungs as his oxygen demand is quite high, note he has refused a CT scan for 2 days  Anoro strictly counseled again on 07/04/2021 and he has agreed now, refusing labs every morning as well, counseled.  Speech therapy also following, no dysphagia but I am wondering if he is intermittently having nausea vomiting due to narcotic bowel and aspirating some during his emesis episodes..  Tobacco use- (present on admission) Continue transdermal nicotine.  Stage 3b chronic kidney disease (CKD) ([place])- (present on admission) Creatinine not far from usual baseline-continue to closely monitor while on diuretics.  We will allow for some mild elevation in creatinine that his usual baseline in an attempt to keep patient on the drier side.   S/P AKA (above knee amputation) unilateral, right ([name]) Monitor stump site.  Lymphedema of left leg On diuretics.  Pressure injury, stage 3 ([place])- (present on admission) Wound care team following  Type II diabetes mellitus with neurological manifestations ([place])- (present on admission) CBG stable-continue Semglee 10 units daily and SSI, oral hypoglycemic agents on hold  CBG (last 3)  Recent Labs    06/28/21 1940 06/28/21 2344 06/29/21 0342  GLUCAP 198* 211* 139*    HLD (hyperlipidemia)- (present on admission) Continue Crestor   HTN (hypertension)- (present on admission) BP stable-off all antihypertensives-resume when able.  Chronic diastolic CHF (congestive heart failure) ([place])- (present on admission) Do not think he has decompensated heart failure at this point-has lymphedema at baseline-remains on scheduled Demadex-continue to maintain negative balance.  Echo on 1/9 with preserved EF.  COPD (chronic obstructive pulmonary disease) ([place])- (present on admission) Not in exacerbation-continue bronchodilators.  Nutrition Status: Nutrition Problem: Increased nutrient needs Etiology: wound healing Signs/Symptoms: estimated needs Interventions: MVI, Prostat, Refer to RD note for recommendations  Obesity: Estimated body mass index is 32.62  kg/m as calculated from the following:   Height as of this encounter: 6' (1.829 m).   Weight as of this encounter: 109.1 kg.    SpO2: 93 % O2 Flow Rate (L/min): 20 L/min FiO2 (%): 40 %   Procedures: None Consults: PCCM DVT Prophylaxis: SQ Heparin Code Status:Full code  Family Communication: Daughter-[name]-[phone] updated on 07/03/21   Disposition Plan: Status is: Inpatient  Remains inpatient appropriate because: Severe hypoxia due to CAP-on heated high flow-SNF when more stable.    Diet: Diet Order             Diet Carb Modified Fluid consistency: Thin; Room service appropriate? Yes  Diet effective now                     Antimicrobial agents: Anti-infectives (From admission, onward)    Start     Dose/Rate Route Frequency Ordered Stop   06/29/21 1000  azithromycin (ZITHROMAX) tablet 500 mg        500 mg Oral Daily 06/28/21 0959 07/01/21 0924   06/28/21 0600  vancomycin (VANCOREADY) IVPB 1750 mg/350 mL  Status:  Discontinued        1,750 mg 175 mL/hr over 120 Minutes Intravenous Every 48 hours 06/27/21 0842 06/27/21 0924   06/27/21 1000  cefTRIAXone (ROCEPHIN) 2 g in sodium chloride 0.9 % 100 mL IVPB        2 g 200 mL/hr over 30 Minutes Intravenous Every 24 hours 06/27/21 0924 07/03/21 1102   06/27/21 1000  azithromycin (ZITHROMAX) 500 mg in sodium  chloride 0.9 % 250 mL IVPB        500 mg 250 mL/hr over 60 Minutes Intravenous Every 24 hours 06/27/21 0924 06/28/21 1148   06/26/21 1600  ceFEPIme (MAXIPIME) 2 g in sodium chloride 0.9 % 100 mL IVPB  Status:  Discontinued        2 g 200 mL/hr over 30 Minutes Intravenous Every 12 hours 06/26/21 0438 06/27/21 0924   06/26/21 0438  vancomycin variable dose per unstable renal function (pharmacist dosing)  Status:  Discontinued         Does not apply See admin instructions 06/26/21 0438 06/27/21 0848   06/26/21 0430  vancomycin (VANCOREADY) IVPB 2000 mg/400 mL        2,000 mg 200 mL/hr over 120 Minutes Intravenous   Once 06/26/21 0418 06/26/21 0800   06/26/21 0430  ceFEPIme (MAXIPIME) 2 g in sodium chloride 0.9 % 100 mL IVPB        2 g 200 mL/hr over 30 Minutes Intravenous  Once 06/26/21 0418 06/26/21 0541   06/26/21 0200  cefTRIAXone (ROCEPHIN) 1 g in sodium chloride 0.9 % 100 mL IVPB        1 g 200 mL/hr over 30 Minutes Intravenous  Once 06/26/21 0154 06/26/21 0226   06/26/21 0200  azithromycin (ZITHROMAX) 500 mg in sodium chloride 0.9 % 250 mL IVPB        500 mg 250 mL/hr over 60 Minutes Intravenous  Once 06/26/21 0154 06/26/21 0349        MEDICATIONS: Scheduled Meds:  (feeding supplement) PROSource Plus  30 mL Oral Daily   arformoterol  15 mcg Nebulization BID   budesonide (PULMICORT) nebulizer solution  0.5 mg Nebulization BID   buPROPion  300 mg Oral Daily   chlorhexidine  15 mL Mouth Rinse BID   Chlorhexidine Gluconate Cloth  6 each Topical Daily   docusate sodium  200 mg Oral BID   DULoxetine  60 mg Oral Daily   fentaNYL  1 patch Transdermal Q72H   gabapentin  300 mg Oral Q8H   guaiFENesin  1,200 mg Oral BID   heparin  5,000 Units Subcutaneous Q8H   insulin aspart  0-5 Units Subcutaneous QHS   insulin aspart  0-9 Units Subcutaneous TID WC   insulin glargine-yfgn  10 Units Subcutaneous Daily   mouth rinse  15 mL Mouth Rinse q12n4p   multivitamin  1 tablet Oral QHS   naloxegol oxalate  12.5 mg Oral Daily   nicotine  21 mg Transdermal Daily   pantoprazole  40 mg Oral Daily   polyethylene glycol  17 g Oral Daily   pregabalin  150 mg Oral BID   prochlorperazine  10 mg Intravenous Once   rosuvastatin  10 mg Oral QHS   tamsulosin  0.4 mg Oral QPM   torsemide  40 mg Oral Daily   umeclidinium bromide  1 puff Inhalation Daily   Continuous Infusions:  sodium chloride Stopped (06/28/21 1202)   PRN Meds:.sodium chloride, acetaminophen, albuterol, ALPRAZolam, benzocaine-Menthol, calcium carbonate, eszopiclone, naLOXone (NARCAN)  injection, ondansetron (ZOFRAN) IV, oxyCODONE,  prochlorperazine, tiZANidine   I have personally reviewed following labs and imaging studies  LABORATORY DATA:  Recent Labs  Lab 06/28/21 0107 06/30/21 0058 07/01/21 0136 07/02/21 0119 07/04/21 0809  WBC 8.8 8.4 9.8 8.4 8.6  HGB 7.6* 7.2* 7.1* 7.8* 9.3*  HCT 25.3* 23.8* 23.8* 25.3* 30.4*  PLT 186 219 295 243 311  MCV 92.7 93.3 91.9 89.7 90.2  MCH 27.8 28.2 27.4  27.7 27.6  MCHC 30.0 30.3 29.8* 30.8 30.6  RDW 16.0* 16.0* 15.7* 15.1 15.4  LYMPHSABS 1.6  --   --   --   --   MONOABS 0.9  --   --   --   --   EOSABS 0.4  --   --   --   --   BASOSABS 0.0  --   --   --   --     Recent Labs  Lab 06/28/21 0107 06/29/21 0140 06/30/21 0058 07/01/21 0136 07/02/21 0119 07/02/21 0131 07/04/21 0809  NA 139 136 136 139 135  --  137  K 4.3 3.9 4.3 4.9 3.9  --  4.0  CL 104 102 106 102 101  --  98  CO2 24 25 25 26 26   --  30  GLUCOSE 134* 164* 176* 128* 212*  --  217*  BUN 33* 34* 35* 36* 35*  --  37*  CREATININE 2.42* 2.50* 2.52* 2.14* 1.98*  --  1.91*  CALCIUM 8.5* 8.3* 8.2* 8.8* 8.7*  --  9.1  AST 19 27 24   --   --   --  33  ALT 12 23 26   --   --   --  49*  ALKPHOS 85 133* 139*  --   --   --  125  BILITOT 0.5 0.2* 0.4  --   --   --  0.5  ALBUMIN 2.2* 2.1* 2.2*  --   --   --  2.4*  MG  --   --   --   --  1.7  --  1.7  CRP  --   --   --   --   --   --  2.0*  PROCALCITON  --   --   --   --   --  0.20  --   BNP  --   --   --   --  57.3  --  13.4     RADIOLOGY STUDIES/RESULTS: DG Chest Port 1 View  Result Date: 07/03/2021 CLINICAL DATA:  Shortness of breath EXAM: PORTABLE CHEST 1 VIEW COMPARISON:  07/01/2021 FINDINGS: Cardiomegaly. Slightly improved diffuse bilateral interstitial pulmonary opacity pulmonary vascular prominence. No new or focal airspace opacity. IMPRESSION: Slightly improved diffuse bilateral interstitial pulmonary opacity and pulmonary vascular prominence, consistent with improved edema. No new or focal airspace opacity. Electronically Signed   By: Delanna Ahmadi  M.D.   On: 07/03/2021 08:14   DG Abd Portable 1V  Result Date: 07/03/2021 CLINICAL DATA:  Nausea and vomiting. EXAM: PORTABLE ABDOMEN - 1 VIEW COMPARISON:  04/25/2018 FINDINGS: There is a moderate stool burden identified within the colon and rectum. No dilated small bowel loops identified. No signs of obstruction. IMPRESSION: Moderate stool burden within the colon and rectum compatible with constipation. Electronically Signed   By: Kerby Moors M.D.   On: 07/03/2021 11:01     LOS: 8 days   Signature  Lala Lund M.D on 07/04/2021 at 12:09 PM   -  To page go to [email or web address]

## 2021-07-04 NOTE — Progress Notes (Signed)
Notified by lab that patient refused labs again this morning. This RN asked patient if there was a reason he did not want to get his labs done again this morning. Patient stated "I feel like I'm dying and I don't want to be poked." When asked why he feels this way patient said he just feels so sick from nausea and pain. I advised patient that labs and CT scan may allow for additional or different treatment of symptoms. Patient continues to refuse labs and CT scan at this time stating that when he is feeling less nauseous he will get the CT scan. MD aware. Continuing current POC at this time.

## 2021-07-04 NOTE — Progress Notes (Signed)
Walked into pt's room with Dr. Candiss Norse. Pt has agreed to CT scan.

## 2021-07-04 NOTE — Progress Notes (Signed)
Pt uses call bell frequently about every 30 minutes to request either pain medication, nausea medication, or anxiety medication. When routinely making pt rounds, pt appears to be resting comfortably but when staff awakens pt he immediately begins to rub right stump and c/o pain or nausea. Pt has refuses therapy today. Dr. Candiss Norse made aware.

## 2021-07-04 NOTE — TOC Progression Note (Signed)
Transition of Care [facility]) - Progression Note    Patient Details  Name: Gregory [name] MRN: [id number] Date of Birth: 08/06/1960  Transition of Care [facility]) CM/SW [place], LCSW Phone Number: 07/04/2021, 10:00 AM  Clinical Narrative:    CSW following for medical stability to return to [facility] ([place]).         Expected Discharge Plan and Services                                                 Social Determinants of Health (SDOH) Interventions    Readmission Risk Interventions Readmission Risk Prevention Plan 07/03/2021 06/23/2019 12/07/2018  Transportation Screening Complete Complete Complete  PCP or Specialist Appt within 3-5 Days - Not Complete -  Not Complete comments - SNF pt -  HRI or [place] - Complete -  Social Work Consult for [place] Planning/Counseling - Complete -  Palliative Care Screening - Not Applicable -  Medication Review ([place]) Referral to Pharmacy Referral to Pharmacy Complete  PCP or Specialist appointment within 3-5 days of discharge Complete Not Complete Complete  PCP/Specialist Appt Not Complete comments - SNF pt -  HRI or Home Care Consult Complete Complete Not Complete  HRI or Home Care Consult Pt Refusal Comments - - pt from SNF LTC  SW Recovery Care/Counseling Consult Complete Complete Complete  Palliative Care Screening Not Applicable Not Applicable Not Applicable  Skilled Nursing Facility Complete Complete Complete  Some recent data might be hidden

## 2021-07-04 NOTE — Progress Notes (Signed)
PT Cancellation Note  Patient Details Name: Gregory [name] MRN: [id number] DOB: 1961/02/25   Cancelled Treatment:    Reason Eval/Treat Not Completed: Patient at procedure or test/unavailable  Respiratory with patient and switching him from [facility] to [facility]. Will give pt time to adjust and check back as schedule permits.    Gregory [name], PT Acute Rehabilitation Services  Pager [phone] Office [phone]  Rexanne Mano 07/04/2021, 2:27 PM

## 2021-07-04 NOTE — Progress Notes (Signed)
OT Cancellation Note  Patient Details Name: Gregory [name] MRN: [id number] DOB: Nov 10, 1960   Cancelled Treatment:    Reason Eval/Treat Not Completed: Patient declined, no reason specified (Patient declined therapy today stating he was unable to participate.  Patient was offered to performed bedside activities, HEP, and patient declined stating he has been feeling nauseous and did not want to vomit. Nursing notified) Lodema Hong, [place]  Pager [phone] Office [phone]  Trixie Dredge 07/04/2021, 9:39 AM

## 2021-07-05 ENCOUNTER — Inpatient Hospital Stay (HOSPITAL_COMMUNITY): Payer: Medicaid Other

## 2021-07-05 DIAGNOSIS — Z515 Encounter for palliative care: Secondary | ICD-10-CM

## 2021-07-05 DIAGNOSIS — J189 Pneumonia, unspecified organism: Secondary | ICD-10-CM | POA: Diagnosis not present

## 2021-07-05 DIAGNOSIS — R7989 Other specified abnormal findings of blood chemistry: Secondary | ICD-10-CM

## 2021-07-05 LAB — COMPREHENSIVE METABOLIC PANEL
ALT: 46 U/L — ABNORMAL HIGH (ref 0–44)
AST: 31 U/L (ref 15–41)
Albumin: 2.5 g/dL — ABNORMAL LOW (ref 3.5–5.0)
Alkaline Phosphatase: 112 U/L (ref 38–126)
Anion gap: 9 (ref 5–15)
BUN: 37 mg/dL — ABNORMAL HIGH (ref 8–23)
CO2: 28 mmol/L (ref 22–32)
Calcium: 9.1 mg/dL (ref 8.9–10.3)
Chloride: 99 mmol/L (ref 98–111)
Creatinine, Ser: 1.9 mg/dL — ABNORMAL HIGH (ref 0.61–1.24)
GFR, Estimated: 39 mL/min — ABNORMAL LOW (ref >60)
Glucose, Bld: 187 mg/dL — ABNORMAL HIGH (ref 70–99)
Potassium: 4 mmol/L (ref 3.5–5.1)
Sodium: 136 mmol/L (ref 135–145)
Total Bilirubin: 0.4 mg/dL (ref 0.3–1.2)
Total Protein: 6.8 g/dL (ref 6.5–8.1)

## 2021-07-05 LAB — CBC WITH DIFFERENTIAL/PLATELET
Abs Immature Granulocytes: 0.18 10*3/uL — ABNORMAL HIGH (ref 0.00–0.07)
Basophils Absolute: 0.1 10*3/uL (ref 0.0–0.1)
Basophils Relative: 1 %
Eosinophils Absolute: 0.3 10*3/uL (ref 0.0–0.5)
Eosinophils Relative: 4 %
HCT: 27.6 % — ABNORMAL LOW (ref 39.0–52.0)
Hemoglobin: 8.7 g/dL — ABNORMAL LOW (ref 13.0–17.0)
Immature Granulocytes: 2 %
Lymphocytes Relative: 26 %
Lymphs Abs: 2.5 10*3/uL (ref 0.7–4.0)
MCH: 28.2 pg (ref 26.0–34.0)
MCHC: 31.5 g/dL (ref 30.0–36.0)
MCV: 89.3 fL (ref 80.0–100.0)
Monocytes Absolute: 0.4 10*3/uL (ref 0.1–1.0)
Monocytes Relative: 5 %
Neutro Abs: 5.8 10*3/uL (ref 1.7–7.7)
Neutrophils Relative %: 62 %
Platelets: 305 10*3/uL (ref 150–400)
RBC: 3.09 MIL/uL — ABNORMAL LOW (ref 4.22–5.81)
RDW: 15.4 % (ref 11.5–15.5)
WBC: 9.3 10*3/uL (ref 4.0–10.5)
nRBC: 0 % (ref 0.0–0.2)

## 2021-07-05 LAB — GLUCOSE, CAPILLARY
Glucose-Capillary: 197 mg/dL — ABNORMAL HIGH (ref 70–99)
Glucose-Capillary: 203 mg/dL — ABNORMAL HIGH (ref 70–99)
Glucose-Capillary: 207 mg/dL — ABNORMAL HIGH (ref 70–99)
Glucose-Capillary: 231 mg/dL — ABNORMAL HIGH (ref 70–99)
Glucose-Capillary: 276 mg/dL — ABNORMAL HIGH (ref 70–99)

## 2021-07-05 LAB — BRAIN NATRIURETIC PEPTIDE: B Natriuretic Peptide: 9.6 pg/mL (ref 0.0–100.0)

## 2021-07-05 LAB — D-DIMER, QUANTITATIVE: D-Dimer, Quant: 3.44 ug/mL-FEU — ABNORMAL HIGH (ref 0.00–0.50)

## 2021-07-05 LAB — PROCALCITONIN: Procalcitonin: <0.1 ng/mL

## 2021-07-05 LAB — C-REACTIVE PROTEIN: CRP: 1.2 mg/dL — ABNORMAL HIGH (ref <1.0)

## 2021-07-05 LAB — MAGNESIUM: Magnesium: 1.7 mg/dL (ref 1.7–2.4)

## 2021-07-05 MED ORDER — TORSEMIDE 20 MG PO TABS
40.0000 mg | ORAL_TABLET | Freq: Two times a day (BID) | ORAL | Status: DC
  Administered 2021-07-05 (×2): 40 mg via ORAL
  Filled 2021-07-05 (×2): qty 2

## 2021-07-05 MED ORDER — MAGNESIUM SULFATE IN D5W 1-5 GM/100ML-% IV SOLN
1.0000 g | Freq: Once | INTRAVENOUS | Status: AC
  Administered 2021-07-05: 1 g via INTRAVENOUS
  Filled 2021-07-05: qty 100

## 2021-07-05 NOTE — Progress Notes (Signed)
RN and MD at bedside. Patient appears calm but persistently complains of pain. Xanax 0.25mg  given. Patient notified of upcoming lung scan procedure. Patient refuses lung scan. MD aware. All questions by patient answered by MD. Will cotinue to mointor

## 2021-07-05 NOTE — Progress Notes (Signed)
Pt weaned from the [facility] down to a [place] at 8L [place]. Tolerating well, explained to pt the difference between the two devices. RN notified of change, Ronan left at bedside for reassurance for pt. RRT will continue to round throughout the day checking in.

## 2021-07-05 NOTE — Progress Notes (Signed)
PIV consult: Pt declined PIV start. Requested to have restarted in the morning. RN made aware and discussed with pt. She will re-enter consult when IV access is needed.

## 2021-07-05 NOTE — Progress Notes (Signed)
PT Cancellation Note  Patient Details Name: Gregory [name] MRN: [id number] DOB: [birth year]   Cancelled Treatment:    Reason Eval/Treat Not Completed: Patient not medically ready  Noted elevated d-dimer with order for V/Q scan and dopplers of LEs. Will defer PT until results noted.    Arby Barrette, PT Acute Rehabilitation Services  Pager [phone] Office [phone]    Rexanne Mano 07/05/2021, 10:31 AM

## 2021-07-05 NOTE — Consult Note (Signed)
Palliative Medicine Inpatient Consult Note  Consulting Provider: Thurnell Lose, MD  Reason for consult:   [facility] Palliative Medicine Consult  Reason for Consult? taking High doses of narcotics - chronic, now PNA - severe hypoxia, refusing tests, GOC   HPI:  Per intake H&P --> Patient is a 61 y.o. male with history of DM-2, HTN, chronic HFpEF, s/p right AKA, COPD-who was admitted to [facility] service on 1/9 with acute hypoxic respiratory failure due to community-acquired pneumonia.  Palliative care has been asked to get involved for further goals of care conversations.  Clinical Assessment/Goals of Care:  *Please note that this is a verbal dictation therefore any spelling or grammatical errors are due to the "[employer] One" system interpretation.  I have reviewed medical records including EPIC notes, labs and imaging, received report from bedside RN, assessed the patient who is lying in bed in NAD.    I met with Gregory [name] to further discuss diagnosis prognosis, GOC, EOL wishes, disposition and options.   I introduced Palliative Medicine as specialized medical care for people living with serious illness. It focuses on providing relief from the symptoms and stress of a serious illness. The goal is to improve quality of life for both the patient and the family.  Medical History Review and Understanding:  Gregory [name] reviews with me that he has a history of heart failure, has had a right lower extremity above-the-knee amputation which was preceded by multiple other amputations, has a history of COPD.  We reviewed his acute admission in the setting of a community-acquired pneumonia and that he is on a high amount of oxygen though per Gregory [name] this is decreased tremendously.  Social History:  Gregory [name] shares with me that he is from [place], [place].  He is divorced.  He has 2 daughters and 3 grandchildren.  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  He expresses that he enjoys spending time with his family.  He is a man of faith and practices within the [employer] denomination.  Functional and Nutritional State:  Gregory [name] shares with me that he has lived at [facility] for 5 years now.  He states that he has an IT trainer wheelchair which she is able to mobilize with.  He is able to perform BADLs for the most part independently.  Gregory [name] expresses that his nutritional state has been quite good.  Palliative Symptoms:  Gregory [name] expresses chronic pain in the setting of his right AKA.  He shares that he is on oxycodone 30 and 5 mg 3 times daily, Lyrica, gabapentin, and tizanidine.  He expresses that in the past he has had symptomatic relief through the use of a fentanyl patch which he presently has on.  He shares that this is helping overall his pain.  Advance Directives: A detailed discussion was had today regarding advanced directives, Dason shares he has never completed these though he would be interested in doing so after comprehensive discussion with his daughters.  If unable to make decisions for himself he would rely on his daughter who is a Marine scientist, Gregory [name] to be his primary Media planner.  Code Status: Concepts specific to code status, artifical feeding and hydration, continued IV antibiotics and rehospitalization was had.  A MOST form was provided for review and completion, Jerek shares he is "  not sure how long he would ever wish to be on life support". He expresses it is difficult as he was prior intubated and needed a tracheostomy which he recovered from. He shares that he would for the time being like to be full code/full scope of care. He expresses that he would like to discuss this more with his daughters after discharge.  Provided "Hard Choices for [employer]" booklet.    Goals for the Future:  Ryden is hopeful to get back to a [facility] within the next few days and continue his prior state of living.  Discussed the importance of continued conversation with family and their  medical providers regarding overall plan of care and treatment options, ensuring decisions are within the context of the patients values and GOCs.  Decision Maker: Gregory [name] (daughter): [phone]  SUMMARY OF RECOMMENDATIONS   Full code-full scope of care for the time being --> he would like to discuss this further with his daughters prior to changing anything. Provided "Hard Choices for Loving People" booklet & a MOST form.  Jamon gets pain management through [facility], Dr. Vilinda Flake  Would benefit from OP Palliative support  Ongoing incremental Palliative support while inpatient  Code Status/Advance Care Planning: FULL CODE   Symptom Management:  Chronic Pain: -Fentanyl 50 MCG to be changed every 72 hours -Oxycodone 30 mg every 6 hours as needed -Gabapentin 300 mg every 8 hours -Lyrica 150 mg twice daily -Zanaflex 4 mg every 6 hours as needed  Anxiety: -Xanax 0.25 mg, 2 times daily as needed  Palliative Prophylaxis:  Aspiration, Bowel Regimen, Delirium Protocol, Frequent Pain Assessment, Oral Care, Palliative Wound Care, and Turn Reposition  Additional Recommendations (Limitations, Scope, Preferences): Continue current course of treatment  Psycho-social/Spiritual:  Desire for further Chaplaincy support: Not presently Additional Recommendations: Education on chronic comorbid conditions   Prognosis: Unclear at this time as Remy has been chronically ill for many years now  Discharge Planning: Discharge to [facility] when medically optimized  Vitals:   07/05/21 0225 07/05/21 0500  BP: (!) 143/72 (!) 156/77  Pulse: (!) 54 61  Resp: 18 20  Temp:  98.2 F (36.8 C)  SpO2: 95% 97%    Intake/Output Summary (Last 24 hours) at 07/05/2021 0651 Last data  filed at 07/05/2021 0516 Gross per 24 hour  Intake 240 ml  Output 2250 ml  Net -2010 ml   Last Weight  Most recent update: 07/02/2021  6:51 AM    Weight  109.1 kg (240 lb 8.4 oz)            Gen: Older Caucasian male in no acute distress HEENT: moist mucous membranes CV: Regular rate and rhythm PULM: On 8LPM [place] ABD: soft/nontender EXT: R AKA Neuro: Alert and oriented x3  PPS: 50%   This conversation/these recommendations were discussed with patient primary care team, Dr. Candiss Norse  MDM High  Medical Decision Making: 4 #/Complex Problems: 4                      Data Reviewed: 4                 Management: 4 (1-Straightforward, 2-Low, 3-Moderate, 4-High) ______________________________________________________ [place] Team Team Cell Phone: [phone] Please utilize secure chat with additional questions, if there is no response within 30 minutes please call the above phone number  Palliative Medicine Team providers are available by phone from 7am to 7pm daily and can be reached through the  team cell phone.  Should this patient require assistance outside of these hours, please call the patient's attending physician.

## 2021-07-05 NOTE — Progress Notes (Signed)
PROGRESS NOTE        PATIENT DETAILS Name: Gregory [name] Age: 61 y.o. Sex: male Date of Birth: 01-31-1961 Admit Date: 06/26/2021 Admitting Physician Aldean Jewett, MD [id number], Brandt Loosen, MD  Brief Narrative:  Patient is a 61 y.o. male with history of DM-2, HTN, chronic HFpEF, s/p right AKA, COPD-who was admitted to [facility] service on 1/9 with acute hypoxic respiratory failure due to community-acquired pneumonia.  Significant events: 1/9>> admit to ICU by PCCM-initially requiring BiPAP. 1/12>> transfer to [facility] on heated high flow  Procedures   TTE -  1. Left ventricular ejection fraction, by estimation, is 50 to 55%. The  left ventricle has low normal function. The left ventricle has no regional  wall motion abnormalities. There is mild left ventricular hypertrophy.  Left ventricular diastolic  parameters were normal.   2. Right ventricular systolic function is normal. The right ventricular  size is mildly enlarged. Tricuspid regurgitation signal is inadequate for  assessing PA pressure.   3. The mitral valve is normal in structure. No evidence of mitral valve  regurgitation. No evidence of mitral stenosis.   4. The aortic valve was not well visualized. Aortic valve regurgitation  is trivial. Aortic valve sclerosis/calcification is present, without any  evidence of aortic stenosis.   5. Aortic dilatation noted. There is dilatation of the aortic root,  measuring 40 mm.   6. The inferior vena cava is dilated in size with >50% respiratory  variability, suggesting right atrial pressure of 8 mmHg.  Leg [place] -  CT -  1. Small bilateral pleural effusions and associated atelectasis or consolidation. 2. Interlobular septal thickening throughout, most suggestive of edema. 3. Geographic airspace attenuation throughout, suggestive of small airways disease 4. Enlarged mediastinal lymph nodes, slightly increased in size compared to prior examination, most likely  reactive. 5. Coronary artery disease. Aortic Atherosclerosis  Subjective: Seen in bed watching TV in no distress, denies any headache or chest pain or shortness of breath, does have cough, still asking for more pain medicines for his right stump pain.   Objective: Vitals: Blood pressure (!) 156/77, pulse 61, temperature 98.2 F (36.8 C), temperature source Oral, resp. rate 20, height 6' (1.829 m), weight 109.1 kg, SpO2 94 %.   Exam:  Awake Alert, No new F.N deficits, Normal affect [place].AT,PERRAL Supple Neck, No JVD,   Symmetrical Chest wall movement, Good air movement bilaterally, few rales RRR,No Gallops, Rubs or new Murmurs,  +ve B.Sounds, Abd Soft, No tenderness,   S/p right AKA-LLE with lymphedema/chronic skin changes.   Assessment/Plan: Acute respiratory failure with hypoxia ([place])- (present on admission) Due to aspiration pneumonia versus community-acquired pneumonia.  He has finished azithromycin treatment, stop Rocephin after 07/05/2021,, speech therapy following, still against excessive narcotic use which could be causing narcotic bowel, nausea vomiting and aspiration.  Also in fluid overload which is improving with diuretics.  CT noncontrast chest noted.  Clinically improved from heated high flow 40 L down to 10 L.  We will continue to titrate his oxygen down.  D-dimer has come back borderline elevated, check lower extremity venous duplex and a VQ scan.  Cannot do CTA due to renal dysfunction.  Echocardiogram noted with minimally elevated RV pressures.   Community acquired pneumonia Probably atypical-however COVID/influenza PCR/respiratory virus panel/sputum/blood cultures are all negative.  Has completed a course of Zithromax-remains on IV Rocephin.  Kindly see above.  Chronic pain- (present on admission) Continue Neurontin/Cymbalta/Lyrica and oxycodone.  Oxycodone dose increased, fentanyl patch applied upon patient's request, I am concerned about accidental narcotic overdose,  I have counseled the patient not to use more narcotics than needed.  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  Patient has been extensively counseled.  He continues to refuse lab work and CT scan despite being counseled multiple times, he is comfortable at all times but still calling staff every 15 to 20 minutes and asking for more narcotics.  He has narcotic bowel full of stool and I am highly concerned about an accidental overdose, ongoing microaspiration due to narcotic bowel and nausea vomiting.  I have expressed my concerns clearly to patient and daughter, patient has been warned that this kind of behavior can lead to accidental overdose, narcotic bowel, continued nausea vomiting, worsening of pneumonia and even death.  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  Counseled against excessive narcotic use again.  Asymptomatic bacteriuria- (present on admission) Urine culture drawn on admission was positive for multidrug-resistant Klebsiella pneumonia-this is felt to be a colonization rather than a infection as patient did not have any UTI symptoms-his presentation is consistent with pneumonia and respiratory failure.  Do not think this needs to be treated at this point.  Normocytic anemia- (present on admission) Probably related to CKD-worsened with acute illness.  Watch closely, and transfuse if Hb <7.  Tobacco use- (present on admission) Continue transdermal nicotine.  Stage 3b chronic kidney disease (CKD) ([place])- (present on admission) Creatinine not far from usual baseline-continue to closely monitor while on diuretics.  We will allow for some mild elevation in creatinine that his usual baseline in an attempt to keep patient on the  drier side.   S/P AKA (above knee amputation) unilateral, right ([place]) Monitor stump site.  Lymphedema of left leg On diuretics.  Pressure injury, stage 3 ([place])- (present on admission) Wound care team following  Type II diabetes mellitus with neurological manifestations ([place])- (present on admission) CBG stable-continue Semglee 10 units daily and SSI, oral hypoglycemic agents on hold  CBG (last 3)  Recent Labs    06/28/21 1940 06/28/21 2344 06/29/21 0342  GLUCAP 198* 211* 139*    HLD (hyperlipidemia)- (present on admission) Continue Crestor   HTN (hypertension)- (present on admission) BP stable-off all antihypertensives-resume when able.  Chronic diastolic CHF (congestive heart failure) ([place])- (present on admission) Do not think he has decompensated heart failure at this point-has lymphedema at baseline-remains on scheduled Demadex-continue to maintain negative balance.  Echo on 1/9 with preserved EF.  COPD (chronic obstructive pulmonary disease) ([place])- (present on admission) Not in exacerbation-continue bronchodilators.  Nutrition Status: Nutrition Problem: Increased nutrient needs Etiology: wound healing Signs/Symptoms: estimated needs Interventions: MVI, Prostat, Refer to RD note for recommendations  Obesity: Estimated body mass index is 32.62 kg/m as calculated from the following:   Height as of this encounter: 6' (1.829 m).   Weight as of this encounter: 109.1 kg.    SpO2: 94 % O2 Flow Rate (L/min): 10 L/min FiO2 (%): 30 %   Procedures: None Consults: PCCM DVT Prophylaxis: SQ Heparin Code Status:Full code  Family Communication: Daughter-[name]-[phone] updated on 07/03/21   Disposition Plan: Status is: Inpatient  Remains inpatient  appropriate because: Severe hypoxia due to CAP-on heated high flow-SNF when more stable.    Diet: Diet Order             Diet Carb Modified Fluid consistency: Thin; Room service appropriate? Yes  Diet effective now                    Antibiotics Given (last 72 hours)     Date/Time Action Medication Dose Rate   07/03/21 1032 New Bag/Given   cefTRIAXone (ROCEPHIN) 2 g in sodium chloride 0.9 % 100 mL IVPB 2 g 200 mL/hr        MEDICATIONS: Scheduled Meds:  (feeding supplement) PROSource Plus  30 mL Oral Daily   arformoterol  15 mcg Nebulization BID   budesonide (PULMICORT) nebulizer solution  0.5 mg Nebulization BID   buPROPion  300 mg Oral Daily   chlorhexidine  15 mL Mouth Rinse BID   Chlorhexidine Gluconate Cloth  6 each Topical Daily   docusate sodium  200 mg Oral BID   DULoxetine  60 mg Oral Daily   fentaNYL  1 patch Transdermal Q72H   gabapentin  300 mg Oral Q8H   guaiFENesin  1,200 mg Oral BID   heparin  5,000 Units Subcutaneous Q8H   insulin aspart  0-5 Units Subcutaneous QHS   insulin aspart  0-9 Units Subcutaneous TID WC   insulin glargine-yfgn  10 Units Subcutaneous Daily   mouth rinse  15 mL Mouth Rinse q12n4p   multivitamin  1 tablet Oral QHS   naloxegol oxalate  12.5 mg Oral Daily   nicotine  21 mg Transdermal Daily   pantoprazole  40 mg Oral Daily   polyethylene glycol  17 g Oral Daily   pregabalin  150 mg Oral BID   prochlorperazine  10 mg Intravenous Once   rosuvastatin  10 mg Oral QHS   tamsulosin  0.4 mg Oral QPM   torsemide  40 mg Oral BID   umeclidinium bromide  1 puff Inhalation Daily   Continuous Infusions:  sodium chloride Stopped (06/28/21 1202)   magnesium sulfate bolus IVPB     PRN Meds:.sodium chloride, acetaminophen, albuterol, ALPRAZolam, benzocaine-Menthol, calcium carbonate, eszopiclone, naLOXone (NARCAN)  injection, ondansetron (ZOFRAN) IV, oxyCODONE, prochlorperazine, tiZANidine   I have personally reviewed following labs and imaging studies  LABORATORY DATA:  Recent Labs  Lab 06/30/21 0058 07/01/21 0136 07/02/21 0119 07/04/21 0809 07/05/21 0141  WBC 8.4 9.8 8.4 8.6 9.3  HGB 7.2* 7.1* 7.8* 9.3* 8.7*  HCT 23.8* 23.8* 25.3* 30.4*  27.6*  PLT 219 295 243 311 305  MCV 93.3 91.9 89.7 90.2 89.3  MCH 28.2 27.4 27.7 27.6 28.2  MCHC 30.3 29.8* 30.8 30.6 31.5  RDW 16.0* 15.7* 15.1 15.4 15.4  LYMPHSABS  --   --   --   --  2.5  MONOABS  --   --   --   --  0.4  EOSABS  --   --   --   --  0.3  BASOSABS  --   --   --   --  0.1    Recent Labs  Lab 06/29/21 0140 06/30/21 0058 07/01/21 0136 07/02/21 0119 07/02/21 0131 07/04/21 0809 07/05/21 0141  NA 136 136 139 135  --  137 136  K 3.9 4.3 4.9 3.9  --  4.0 4.0  CL 102 106 102 101  --  98 99  CO2 25 25 26 26   --  30 28  GLUCOSE 164*  176* 128* 212*  --  217* 187*  BUN 34* 35* 36* 35*  --  37* 37*  CREATININE 2.50* 2.52* 2.14* 1.98*  --  1.91* 1.90*  CALCIUM 8.3* 8.2* 8.8* 8.7*  --  9.1 9.1  AST 27 24  --   --   --  33 31  ALT 23 26  --   --   --  49* 46*  ALKPHOS 133* 139*  --   --   --  125 112  BILITOT 0.2* 0.4  --   --   --  0.5 0.4  ALBUMIN 2.1* 2.2*  --   --   --  2.4* 2.5*  MG  --   --   --  1.7  --  1.7 1.7  CRP  --   --   --   --   --  2.0* 1.2*  DDIMER  --   --   --   --   --   --  3.44*  PROCALCITON  --   --   --   --  0.20 <0.10 <0.10  BNP  --   --   --  57.3  --  13.4 9.6     RADIOLOGY STUDIES/RESULTS: CT CHEST WO CONTRAST  Result Date: 07/04/2021 CLINICAL DATA:  Pneumonia, shortness of breath, fever, cough EXAM: CT CHEST WITHOUT CONTRAST TECHNIQUE: Multidetector CT imaging of the chest was performed following the standard protocol without IV contrast. RADIATION DOSE REDUCTION: This exam was performed according to the departmental dose-optimization program which includes automated exposure control, adjustment of the mA and/or kV according to patient size and/or use of iterative reconstruction technique. COMPARISON:  CT chest, 10/20/2020 FINDINGS: Cardiovascular: Aortic atherosclerosis. Normal heart size. Left and right coronary artery calcifications. No pericardial effusion. Mediastinum/Nodes: Enlarged mediastinal lymph nodes, largest pretracheal nodes  measuring up to 2.3 x 1.4 cm, slightly increased in size compared to prior examination (series 3, image 47). Thyroid gland, trachea, and esophagus demonstrate no significant findings. Lungs/Pleura: Small bilateral pleural effusions and associated atelectasis or consolidation. Interlobular septal thickening throughout. Geographic airspace attenuation throughout. Upper Abdomen: No acute abnormality. Musculoskeletal: No chest wall abnormality. No suspicious osseous lesions identified. IMPRESSION: 1. Small bilateral pleural effusions and associated atelectasis or consolidation. 2. Interlobular septal thickening throughout, most suggestive of edema. 3. Geographic airspace attenuation throughout, suggestive of small airways disease 4. Enlarged mediastinal lymph nodes, slightly increased in size compared to prior examination, most likely reactive. 5. Coronary artery disease. Aortic Atherosclerosis (ICD10-I70.0). Electronically Signed   By: Delanna Ahmadi M.D.   On: 07/04/2021 13:04   DG Abd Portable 1V  Result Date: 07/03/2021 CLINICAL DATA:  Nausea and vomiting. EXAM: PORTABLE ABDOMEN - 1 VIEW COMPARISON:  04/25/2018 FINDINGS: There is a moderate stool burden identified within the colon and rectum. No dilated small bowel loops identified. No signs of obstruction. IMPRESSION: Moderate stool burden within the colon and rectum compatible with constipation. Electronically Signed   By: Kerby Moors M.D.   On: 07/03/2021 11:01     LOS: 9 days   Signature  Lala Lund M.D on 07/05/2021 at 9:56 AM   -  To page go to [email or web address]

## 2021-07-05 NOTE — Progress Notes (Signed)
Lower extremity venous LT study completed.   Please see CV Proc for preliminary results.   Caralina Nop, RDMS, RVT  

## 2021-07-05 NOTE — Progress Notes (Signed)
RN at bedside. Patient appears calm but consistently complains of patch. Patient requested increase in hi-flow O2. Hi flow O2 increased from 10L to 20L. Patient notified on MD plan of care to wean of O2. Patient pleasantly requests O2 be increased for comfort. MD notified. Will continue to monitor

## 2021-07-06 DIAGNOSIS — J189 Pneumonia, unspecified organism: Secondary | ICD-10-CM | POA: Diagnosis not present

## 2021-07-06 LAB — COMPREHENSIVE METABOLIC PANEL
ALT: 47 U/L — ABNORMAL HIGH (ref 0–44)
AST: 27 U/L (ref 15–41)
Albumin: 2.7 g/dL — ABNORMAL LOW (ref 3.5–5.0)
Alkaline Phosphatase: 104 U/L (ref 38–126)
Anion gap: 9 (ref 5–15)
BUN: 43 mg/dL — ABNORMAL HIGH (ref 8–23)
CO2: 29 mmol/L (ref 22–32)
Calcium: 9.1 mg/dL (ref 8.9–10.3)
Chloride: 98 mmol/L (ref 98–111)
Creatinine, Ser: 2.17 mg/dL — ABNORMAL HIGH (ref 0.61–1.24)
GFR, Estimated: 34 mL/min — ABNORMAL LOW (ref >60)
Glucose, Bld: 223 mg/dL — ABNORMAL HIGH (ref 70–99)
Potassium: 4 mmol/L (ref 3.5–5.1)
Sodium: 136 mmol/L (ref 135–145)
Total Bilirubin: <0.1 mg/dL — ABNORMAL LOW (ref 0.3–1.2)
Total Protein: 6.9 g/dL (ref 6.5–8.1)

## 2021-07-06 LAB — CBC WITH DIFFERENTIAL/PLATELET
Abs Immature Granulocytes: 0.14 10*3/uL — ABNORMAL HIGH (ref 0.00–0.07)
Basophils Absolute: 0 10*3/uL (ref 0.0–0.1)
Basophils Relative: 1 %
Eosinophils Absolute: 0.3 10*3/uL (ref 0.0–0.5)
Eosinophils Relative: 4 %
HCT: 29.5 % — ABNORMAL LOW (ref 39.0–52.0)
Hemoglobin: 9.2 g/dL — ABNORMAL LOW (ref 13.0–17.0)
Immature Granulocytes: 2 %
Lymphocytes Relative: 27 %
Lymphs Abs: 2.1 10*3/uL (ref 0.7–4.0)
MCH: 28 pg (ref 26.0–34.0)
MCHC: 31.2 g/dL (ref 30.0–36.0)
MCV: 89.7 fL (ref 80.0–100.0)
Monocytes Absolute: 0.4 10*3/uL (ref 0.1–1.0)
Monocytes Relative: 6 %
Neutro Abs: 4.7 10*3/uL (ref 1.7–7.7)
Neutrophils Relative %: 60 %
Platelets: 282 10*3/uL (ref 150–400)
RBC: 3.29 MIL/uL — ABNORMAL LOW (ref 4.22–5.81)
RDW: 15.7 % — ABNORMAL HIGH (ref 11.5–15.5)
WBC: 7.6 10*3/uL (ref 4.0–10.5)
nRBC: 0 % (ref 0.0–0.2)

## 2021-07-06 LAB — GLUCOSE, CAPILLARY
Glucose-Capillary: 205 mg/dL — ABNORMAL HIGH (ref 70–99)
Glucose-Capillary: 262 mg/dL — ABNORMAL HIGH (ref 70–99)
Glucose-Capillary: 192 mg/dL — ABNORMAL HIGH (ref 70–99)
Glucose-Capillary: 312 mg/dL — ABNORMAL HIGH (ref 70–99)

## 2021-07-06 LAB — D-DIMER, QUANTITATIVE: D-Dimer, Quant: 3.57 ug/mL-FEU — ABNORMAL HIGH (ref 0.00–0.50)

## 2021-07-06 LAB — MAGNESIUM: Magnesium: 1.9 mg/dL (ref 1.7–2.4)

## 2021-07-06 MED ORDER — FENTANYL 50 MCG/HR TD PT72
1.0000 | MEDICATED_PATCH | TRANSDERMAL | Status: DC
  Administered 2021-07-06: 1 via TRANSDERMAL
  Filled 2021-07-06: qty 1

## 2021-07-06 MED ORDER — TORSEMIDE 20 MG PO TABS
40.0000 mg | ORAL_TABLET | Freq: Every day | ORAL | Status: DC
  Administered 2021-07-06 – 2021-07-07 (×2): 40 mg via ORAL
  Filled 2021-07-06 (×2): qty 2

## 2021-07-06 MED ORDER — ONDANSETRON HCL 4 MG PO TABS
4.0000 mg | ORAL_TABLET | Freq: Three times a day (TID) | ORAL | Status: DC | PRN
  Administered 2021-07-06 – 2021-07-07 (×3): 4 mg via ORAL
  Filled 2021-07-06 (×3): qty 1

## 2021-07-06 NOTE — Progress Notes (Signed)
Inpatient Diabetes Program Recommendations  AACE/ADA: New Consensus Statement on Inpatient Glycemic Control (2015)  Target Ranges:  Prepandial:   less than 140 mg/dL      Peak postprandial:   less than 180 mg/dL (1-2 hours)      Critically ill patients:  140 - 180 mg/dL   Lab Results  Component Value Date   GLUCAP 262 (H) 07/06/2021   HGBA1C 7.2 (H) 06/26/2021    Review of Glycemic Control  Latest Reference Range & Units 07/05/21 15:51 07/05/21 20:27 07/06/21 07:43 07/06/21 11:41  Glucose-Capillary 70 - 99 mg/dL 203 (H) 276 (H) 205 (H) 262 (H)  (H): Data is abnormally high Diabetes history: DM2 Outpatient Diabetes medications:  Lantus 14 units QD, Humalog 0-10 units TID, Glucotrol 10 mg QD Current orders for Inpatient glycemic control:  Semglee 10 units QD, Novolog 0-9 units TID and 0-5 units QHS    Inpatient Diabetes Program Recommendations:     Consider Semglee 14 units QD (home dose).  Thanks, Bronson Curb, MSN, RNC-OB Diabetes Coordinator [phone] (8a-5p)

## 2021-07-06 NOTE — Progress Notes (Signed)
Physical Therapy Treatment Patient Details Name: Gregory [name] MRN: [id number] DOB: Jul 14, 1960 Today's Date: 07/06/2021   History of Present Illness 61 y.o. male, who presented to the [facility] EDfrom SNF with a chief complaint of SOB, fever, and productive cough. Pt with repiratory failure with hypoxemia with SpO2 in the 60s on arrival. Started on NRB, then BiPap. PMH:CHF, HTN, HLD, COPD, osteomyelitis, RT AKA, chronic L leg wounds, L toe amputations, CKD3, DM2    PT Comments    Patient initially refusing treatment due to fatigue, however ultimately agreed when framed session is designed to keep him independent with his transfers so he can get on/off his scooter and have increased independence. See below for further details    Recommendations for follow up therapy are one component of a multi-disciplinary discharge planning process, led by the attending physician.  Recommendations may be updated based on patient status, additional functional criteria and insurance authorization.  Follow Up Recommendations  Skilled nursing-short term rehab (<3 hours/day)     Assistance Recommended at Discharge Frequent or constant Supervision/Assistance  Patient can return home with the following A lot of help with walking and/or transfers;Assistance with cooking/housework;Assist for transportation;A little help with bathing/dressing/bathroom   Equipment Recommendations  None recommended by PT    Recommendations for Other Services       Precautions / Restrictions Precautions Precautions: Fall Precaution Comments: sacral and LLE wound, high flow heated O2 Restrictions Weight Bearing Restrictions: No     Mobility  Bed Mobility Overal bed mobility: Needs Assistance Bed Mobility: Supine to Sit, Sit to Supine     Supine to sit: Min guard Sit to supine: Modified independent (Device/Increase time)   General bed mobility comments: pt can move but guarded due to bed structure with soft mattress     Transfers Overall transfer level: Needs assistance Equipment used: None Transfers: Bed to chair/wheelchair/BSC            Lateral/Scoot Transfers: Min guard General transfer comment: lateral scoot to left and then after rest, back to right; min guard for safety with no physical assist needed; able to clear buttocks from bed each time    Ambulation/Gait                   Stairs             Wheelchair Mobility    Modified Rankin (Stroke Patients Only)       Balance Overall balance assessment: Needs assistance Sitting-balance support: No upper extremity supported, Feet unsupported Sitting balance-Leahy Scale: Fair Sitting balance - Comments: for LAQ, pt stabilized with bil UEs; pt sat EOB x 8 minutes                                    Cognition Arousal/Alertness: Awake/alert Behavior During Therapy: WFL for tasks assessed/performed Overall Cognitive Status: Within Functional Limits for tasks assessed                                          Exercises General Exercises - Lower Extremity Long Arc Quad: Left, 20 reps, Seated Other Exercises Other Exercises: pt reports he has been doing SLR and heelslides in supine on his own. Educated in single leg bridging, however pt felt too nauseated to attempt. Stated he was familiar with exercise and would try  when he feels better    General Comments General comments (skin integrity, edema, etc.): pt on 5L with sats 94-95% throughout session      Pertinent Vitals/Pain Pain Assessment Pain Assessment: Faces Faces Pain Scale: Hurts a little bit Pain Location: right leg stump pain Pain Descriptors / Indicators: Aching, Constant Pain Intervention(s): Monitored during session    Home Living                          Prior Function            PT Goals (current goals can now be found in the care plan section) Acute Rehab PT Goals Patient Stated Goal: to get back to  PLOF and home Time For Goal Achievement: 07/14/21 Potential to Achieve Goals: Fair Progress towards PT goals: Progressing toward goals    Frequency    Min 2X/week      PT Plan      Co-evaluation              AM-PAC PT "6 Clicks" Mobility   Outcome Measure  Help needed turning from your back to your side while in a flat bed without using bedrails?: A Little Help needed moving from lying on your back to sitting on the side of a flat bed without using bedrails?: A Little Help needed moving to and from a bed to a chair (including a wheelchair)?: A Little Help needed standing up from a chair using your arms (e.g., wheelchair or bedside chair)?: A Little Help needed to walk in hospital room?: Total Help needed climbing 3-5 steps with a railing? : Total 6 Click Score: 14    End of Session Equipment Utilized During Treatment: Oxygen Activity Tolerance: Treatment limited secondary to medical complications (Comment) (nausea) Patient left: in bed;with call bell/phone within reach;with bed alarm set Nurse Communication: Mobility status PT Visit Diagnosis: Other abnormalities of gait and mobility (R26.89);Other (comment) (respiratory failure)     Time: 1255-1311 PT Time Calculation (min) (ACUTE ONLY): 16 min  Charges:  $Therapeutic Activity: 8-22 mins                      Arby Barrette, PT Acute Rehabilitation Services  Pager [phone] Office [phone]    Rexanne Mano 07/06/2021, 1:26 PM

## 2021-07-06 NOTE — Progress Notes (Signed)
RN at bedside. Patient appears irritable and complains of pain. Patient is noted to complain of pain throughout shift. Patient made multiple requests to increase dosage + frequency of narcotic pain medication. Patient educated on possible side effects of narcotic pain medication. Attending MD aware. Patient is noted to become progressively irritable throughout shift. Patient is noted to be verbally aggressive toward RN and 5W staff. Agricultural consultant notified. Unable to collect Covid swab at this time. Report to be given to oncoming RN.

## 2021-07-06 NOTE — Plan of Care (Signed)

## 2021-07-06 NOTE — Progress Notes (Signed)
Fentanyl patch no longer found on pt. Located in bed and disposed of with primary RN, [place]. New patch requested from pharmacy.

## 2021-07-06 NOTE — Progress Notes (Signed)
PROGRESS NOTE        PATIENT DETAILS Name: Gregory [name] Age: 61 y.o. Sex: male Date of Birth: December 06, 1960 Admit Date: 06/26/2021 Admitting Physician Aldean Jewett, MD TKW:[id number], Brandt Loosen, MD  Brief Narrative:  Patient is a 61 y.o. male with history of DM-2, HTN, chronic HFpEF, s/p right AKA, COPD-who was admitted to [facility] service on 1/9 with acute hypoxic respiratory failure due to community-acquired pneumonia.  Significant events: 1/9>> admit to ICU by PCCM-initially requiring BiPAP. 1/12>> transfer to [facility] on heated high flow  Procedures   VQ scan.  Despite extensive counseling patient refused x2 assumes all responsibility.  Nurse [place] bedside.     TTE -  1. Left ventricular ejection fraction, by estimation, is 50 to 55%. The left ventricle has low normal function. The left ventricle has no regional wall motion abnormalities. There is mild left ventricular hypertrophy. Left ventricular diastolic  parameters were normal.   2. Right ventricular systolic function is normal. The right ventricular size is mildly enlarged. Tricuspid regurgitation signal is inadequate for assessing PA pressure.   3. The mitral valve is normal in structure. No evidence of mitral valve regurgitation. No evidence of mitral stenosis.   4. The aortic valve was not well visualized. Aortic valve regurgitation is trivial. Aortic valve sclerosis/calcification is present, without any evidence of aortic stenosis.   5. Aortic dilatation noted. There is dilatation of the aortic root, measuring 40 mm.   6. The inferior vena cava is dilated in size with >50% respiratory variability, suggesting right atrial pressure of 8 mmHg.  Leg [place] - no DVT  CT -  1. Small bilateral pleural effusions and associated atelectasis or consolidation. 2. Interlobular septal thickening throughout, most suggestive of edema. 3. Geographic airspace attenuation throughout, suggestive of small airways disease 4.  Enlarged mediastinal lymph nodes, slightly increased in size compared to prior examination, most likely reactive. 5. Coronary artery disease. Aortic Atherosclerosis  Subjective: Patient in bed in no distress watching television, denies any headache chest or abdominal pain, chronic right stump pain in the leg.  Shortness of breath much improved again refused VQ scan he had initially refused VQ scan despite counseling on 07/05/2021 has refused again on 07/06/2021   Objective: Vitals: Blood pressure (!) 112/58, pulse 66, temperature 98.5 F (36.9 C), temperature source Oral, resp. rate 17, height 6' (1.829 m), weight 102.9 kg, SpO2 93 %.   Exam:  Awake Alert, No new F.N deficits, Normal affect [place].AT,PERRAL Supple Neck, No JVD,   Symmetrical Chest wall movement, Good air movement bilaterally,few rales RRR,No Gallops, Rubs or new Murmurs,  +ve B.Sounds, Abd Soft, No tenderness,   S/p right AKA-LLE with lymphedema/chronic skin changes.   Assessment/Plan: Acute respiratory failure with hypoxia ([place])- (present on admission) Due to aspiration pneumonia versus community-acquired pneumonia.  He has finished azithromycin treatment, stop Rocephin after 07/05/2021,, speech therapy following, still against excessive narcotic use which could be causing narcotic bowel, nausea vomiting and aspiration.  Also in fluid overload which is improving with diuretics.  CT noncontrast chest noted.  Clinically improved from heated high flow 40 L down to 6 L.  We will continue to titrate his oxygen down.  D-dimer has come back borderline elevated, lower extremity venous duplex unremarkable.  Cannot do CTA due to renal dysfunction.  Echocardiogram noted with minimally elevated RV pressures.  VQ scan  ordered but patient refused twice despite extensive counseling first on 07/05/2021 then again on 07/06/2021.  He is zooms all responsibility for missing a PE with resulting death or disability.  Nurse for the patient [place] in the  room during this conversation kindly see documentation by him.   Community acquired pneumonia Probably atypical-however COVID/influenza PCR/respiratory virus panel/sputum/blood cultures are all negative.  Has completed a course of Zithromax-remains on IV Rocephin.  Kindly see above.  Chronic pain- (present on admission) Continue Neurontin/Cymbalta/Lyrica and oxycodone.  Oxycodone dose increased, fentanyl patch applied upon patient's request, I am concerned about accidental narcotic overdose, I have counseled the patient not to use more narcotics than needed.  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  Patient has been extensively counseled.  He continues to refuse lab work and CT scan despite being counseled multiple times, he is comfortable at all times but still calling staff every 15 to 20 minutes and asking for more narcotics.  He has narcotic bowel full of stool and I am highly concerned about an accidental overdose, ongoing microaspiration due to narcotic bowel and nausea vomiting.  I have expressed my concerns clearly to patient and daughter, patient has been warned that this kind of behavior can lead to accidental overdose, narcotic bowel, continued nausea vomiting, worsening of pneumonia and even death.  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  Counseled against excessive narcotic use again.  Asymptomatic bacteriuria- (present on admission) Urine culture drawn on admission was positive for multidrug-resistant Klebsiella pneumonia-this is felt to be a colonization rather than a infection as patient did not have any UTI symptoms-his presentation is consistent with pneumonia and respiratory failure.  Do not think this needs to be  treated at this point.  Normocytic anemia- (present on admission) Probably related to CKD-worsened with acute illness.  Watch closely, and transfuse if Hb <7.  Tobacco use- (present on admission) Continue transdermal nicotine.  Stage 3b chronic kidney disease (CKD) ([place])- (present on admission) Creatinine not far from usual baseline-continue to closely monitor while on diuretics.  We will allow for some mild elevation in creatinine that his usual baseline in an attempt to keep patient on the drier side.   S/P AKA (above knee amputation) unilateral, right ([place]) Monitor stump site.  Lymphedema of left leg On diuretics.  Pressure injury, stage 3 ([place])- (present on admission) Wound care team following  Type II diabetes mellitus with neurological manifestations ([place])- (present on admission) CBG stable-continue Semglee 10 units daily and SSI, oral hypoglycemic agents on hold  CBG (last 3)  Recent Labs    06/28/21 1940 06/28/21 2344 06/29/21 0342  GLUCAP 198* 211* 139*    HLD (hyperlipidemia)- (present on admission) Continue Crestor   HTN (hypertension)- (present on admission) BP stable-off all antihypertensives-resume when able.  Chronic diastolic CHF (congestive heart failure) ([place])- (present on admission) Do not think he has decompensated heart failure at this point-has lymphedema at baseline-remains on scheduled Demadex-continue to maintain negative balance.  Echo on 1/9 with preserved EF.  COPD (chronic obstructive pulmonary disease) ([place])- (present on admission) Not in exacerbation-continue bronchodilators.  Nutrition Status: Nutrition Problem: Increased nutrient needs Etiology: wound healing Signs/Symptoms: estimated needs Interventions: MVI, Prostat, Refer to RD note for recommendations  Obesity: Estimated body mass index  is 30.77 kg/m as calculated from the following:   Height as of this encounter: 6' (1.829 m).   Weight as of this encounter: 102.9 kg.     SpO2: 93 % O2 Flow Rate (L/min): 5 L/min FiO2 (%): 30 %   Procedures: None Consults: PCCM DVT Prophylaxis: SQ Heparin Code Status:Full code  Family Communication: Daughter-[name]-[phone] updated on 07/03/21   Disposition Plan: Status is: Inpatient  Remains inpatient appropriate because: Severe hypoxia due to CAP-on heated high flow-SNF when more stable.    Diet: Diet Order             Diet Carb Modified Fluid consistency: Thin; Room service appropriate? Yes  Diet effective now                   Antibiotics Given (last 72 hours)     Date/Time Action Medication Dose Rate   07/03/21 1032 New Bag/Given   cefTRIAXone (ROCEPHIN) 2 g in sodium chloride 0.9 % 100 mL IVPB 2 g 200 mL/hr        MEDICATIONS: Scheduled Meds:  (feeding supplement) PROSource Plus  30 mL Oral Daily   arformoterol  15 mcg Nebulization BID   budesonide (PULMICORT) nebulizer solution  0.5 mg Nebulization BID   buPROPion  300 mg Oral Daily   chlorhexidine  15 mL Mouth Rinse BID   Chlorhexidine Gluconate Cloth  6 each Topical Daily   docusate sodium  200 mg Oral BID   DULoxetine  60 mg Oral Daily   fentaNYL  1 patch Transdermal Q72H   gabapentin  300 mg Oral Q8H   guaiFENesin  1,200 mg Oral BID   heparin  5,000 Units Subcutaneous Q8H   insulin aspart  0-5 Units Subcutaneous QHS   insulin aspart  0-9 Units Subcutaneous TID WC   insulin glargine-yfgn  10 Units Subcutaneous Daily   mouth rinse  15 mL Mouth Rinse q12n4p   multivitamin  1 tablet Oral QHS   naloxegol oxalate  12.5 mg Oral Daily   nicotine  21 mg Transdermal Daily   pantoprazole  40 mg Oral Daily   polyethylene glycol  17 g Oral Daily   pregabalin  150 mg Oral BID   prochlorperazine  10 mg Intravenous Once   rosuvastatin  10 mg Oral QHS   tamsulosin  0.4 mg Oral QPM   torsemide  40 mg Oral BID   umeclidinium bromide  1 puff Inhalation Daily   Continuous Infusions:  sodium chloride Stopped (06/28/21 1202)   PRN  Meds:.sodium chloride, acetaminophen, albuterol, ALPRAZolam, benzocaine-Menthol, calcium carbonate, eszopiclone, naLOXone (NARCAN)  injection, ondansetron (ZOFRAN) IV, oxyCODONE, prochlorperazine, tiZANidine   I have personally reviewed following labs and imaging studies  LABORATORY DATA:  Recent Labs  Lab 07/01/21 0136 07/02/21 0119 07/04/21 0809 07/05/21 0141 07/06/21 0147  WBC 9.8 8.4 8.6 9.3 7.6  HGB 7.1* 7.8* 9.3* 8.7* 9.2*  HCT 23.8* 25.3* 30.4* 27.6* 29.5*  PLT 295 243 311 305 282  MCV 91.9 89.7 90.2 89.3 89.7  MCH 27.4 27.7 27.6 28.2 28.0  MCHC 29.8* 30.8 30.6 31.5 31.2  RDW 15.7* 15.1 15.4 15.4 15.7*  LYMPHSABS  --   --   --  2.5 2.1  MONOABS  --   --   --  0.4 0.4  EOSABS  --   --   --  0.3 0.3  BASOSABS  --   --   --  0.1 0.0    Recent Labs  Lab 06/30/21 0058 07/01/21 0136  07/02/21 0119 07/02/21 0131 07/04/21 0809 07/05/21 0141 07/06/21 0147  NA 136 139 135  --  137 136 136  K 4.3 4.9 3.9  --  4.0 4.0 4.0  CL 106 102 101  --  98 99 98  CO2 25 26 26   --  30 28 29   GLUCOSE 176* 128* 212*  --  217* 187* 223*  BUN 35* 36* 35*  --  37* 37* 43*  CREATININE 2.52* 2.14* 1.98*  --  1.91* 1.90* 2.17*  CALCIUM 8.2* 8.8* 8.7*  --  9.1 9.1 9.1  AST 24  --   --   --  33 31 27  ALT 26  --   --   --  49* 46* 47*  ALKPHOS 139*  --   --   --  125 112 104  BILITOT 0.4  --   --   --  0.5 0.4 <0.1*  ALBUMIN 2.2*  --   --   --  2.4* 2.5* 2.7*  MG  --   --  1.7  --  1.7 1.7 1.9  CRP  --   --   --   --  2.0* 1.2*  --   DDIMER  --   --   --   --   --  3.44* 3.57*  PROCALCITON  --   --   --  0.20 <0.10 <0.10  --   BNP  --   --  57.3  --  13.4 9.6  --      RADIOLOGY STUDIES/RESULTS: CT CHEST WO CONTRAST  Result Date: 07/04/2021 CLINICAL DATA:  Pneumonia, shortness of breath, fever, cough EXAM: CT CHEST WITHOUT CONTRAST TECHNIQUE: Multidetector CT imaging of the chest was performed following the standard protocol without IV contrast. RADIATION DOSE REDUCTION: This exam was  performed according to the departmental dose-optimization program which includes automated exposure control, adjustment of the mA and/or kV according to patient size and/or use of iterative reconstruction technique. COMPARISON:  CT chest, 10/20/2020 FINDINGS: Cardiovascular: Aortic atherosclerosis. Normal heart size. Left and right coronary artery calcifications. No pericardial effusion. Mediastinum/Nodes: Enlarged mediastinal lymph nodes, largest pretracheal nodes measuring up to 2.3 x 1.4 cm, slightly increased in size compared to prior examination (series 3, image 47). Thyroid gland, trachea, and esophagus demonstrate no significant findings. Lungs/Pleura: Small bilateral pleural effusions and associated atelectasis or consolidation. Interlobular septal thickening throughout. Geographic airspace attenuation throughout. Upper Abdomen: No acute abnormality. Musculoskeletal: No chest wall abnormality. No suspicious osseous lesions identified. IMPRESSION: 1. Small bilateral pleural effusions and associated atelectasis or consolidation. 2. Interlobular septal thickening throughout, most suggestive of edema. 3. Geographic airspace attenuation throughout, suggestive of small airways disease 4. Enlarged mediastinal lymph nodes, slightly increased in size compared to prior examination, most likely reactive. 5. Coronary artery disease. Aortic Atherosclerosis (ICD10-I70.0). Electronically Signed   By: Delanna Ahmadi M.D.   On: 07/04/2021 13:04   DG Chest Port 1 View  Result Date: 07/05/2021 CLINICAL DATA:  Shortness of breath EXAM: PORTABLE CHEST 1 VIEW COMPARISON:  07/03/2021 FINDINGS: Persistent interstitial changes. Remains right suprahilar atelectasis/consolidation. No significant pleural effusion. No pneumothorax. Similar cardiomediastinal contours. IMPRESSION: Persistent interstitial changes may reflect edema. Remains right suprahilar atelectasis/consolidation. Electronically Signed   By: Macy Mis M.D.   On:  07/05/2021 10:14   VAS [place] LOWER EXTREMITY VENOUS (DVT)  Result Date: 07/05/2021  Lower Venous DVT Study Patient Name:  ROXAS CLYMER  Date of Exam:   07/05/2021 Medical Rec #: [id number]  Accession #:    [id number] Date of Birth: 02/22/1961    Patient Gender: M Patient Age:   74 years Exam Location[facility]  [facility] Procedure:      VAS [place] LOWER EXTREMITY VENOUS (DVT) Referring Phys: Deno Etienne [facility] --------------------------------------------------------------------------------  Indications: Elevated d-dimer,.  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 Limitations: Skin changes left calf. Comparison Study: 07-14-2019 Lower extremity venous was negative for DVT. Performing Technologist: Darlin Coco RDMS, RVT  Examination Guidelines: A complete evaluation includes B-mode imaging, spectral Doppler, color Doppler, and power Doppler as needed of all accessible portions of each vessel. Bilateral testing is considered an integral part of a complete examination. Limited examinations for reoccurring indications may be performed as noted. The reflux portion of the exam is performed with the patient in reverse Trendelenburg.  +---------+---------------+---------+-----------+----------+--------------+  RIGHT     Compressibility Phasicity Spontaneity Properties Thrombus Aging  +---------+---------------+---------+-----------+----------+--------------+  CFV       Full            Yes       Yes                                    +---------+---------------+---------+-----------+----------+--------------+  SFJ       Full                                                             +---------+---------------+---------+-----------+----------+--------------+  FV Prox   Full                                                             +---------+---------------+---------+-----------+----------+--------------+  FV Mid                                                     AKA              +---------+---------------+---------+-----------+----------+--------------+  FV Distal                                                  AKA             +---------+---------------+---------+-----------+----------+--------------+  PFV       Full                                                             +---------+---------------+---------+-----------+----------+--------------+   +---------+---------------+---------+-----------+----------+--------------+  LEFT      Compressibility Phasicity Spontaneity Properties Thrombus Aging  +---------+---------------+---------+-----------+----------+--------------+  CFV       Full            Yes  Yes                                    +---------+---------------+---------+-----------+----------+--------------+  SFJ       Full                                                             +---------+---------------+---------+-----------+----------+--------------+  FV Prox   Full                                                             +---------+---------------+---------+-----------+----------+--------------+  FV Mid    Full                                                             +---------+---------------+---------+-----------+----------+--------------+  FV Distal Full                                                             +---------+---------------+---------+-----------+----------+--------------+  PFV       Full                                                             +---------+---------------+---------+-----------+----------+--------------+  POP       Full            Yes       Yes                                    +---------+---------------+---------+-----------+----------+--------------+  PTV       Full                                                             +---------+---------------+---------+-----------+----------+--------------+  PERO      Full                                                              +---------+---------------+---------+-----------+----------+--------------+  Gastroc   Full                                                             +---------+---------------+---------+-----------+----------+--------------+  Summary: RIGHT: - No evidence of common femoral vein obstruction.  LEFT: - There is no evidence of deep vein thrombosis in the lower extremity.  - No cystic structure found in the popliteal fossa.  - Ultrasound characteristics of enlarged lymph nodes noted in the groin.  *See table(s) above for measurements and observations. Electronically signed by Jamelle Haring on 07/05/2021 at 6:08:49 PM.    Final      LOS: 10 days   Signature  Lala Lund M.D on 07/06/2021 at 9:36 AM   -  To page go to [email or web address]

## 2021-07-06 NOTE — Progress Notes (Signed)
OT Cancellation Note  Patient Details Name: Gregory [name] MRN: [id number] DOB: [birth year]   Cancelled Treatment:    Reason Eval/Treat Not Completed: Patient declined, he reported that he is having n/v and that earlier PT session wore him out. Pt requests OT return tomorrow  Britt Bottom 07/06/2021, 3:03 PM

## 2021-07-07 ENCOUNTER — Inpatient Hospital Stay (HOSPITAL_COMMUNITY): Payer: Medicaid Other

## 2021-07-07 DIAGNOSIS — J189 Pneumonia, unspecified organism: Secondary | ICD-10-CM | POA: Diagnosis not present

## 2021-07-07 LAB — COMPREHENSIVE METABOLIC PANEL
ALT: 55 U/L — ABNORMAL HIGH (ref 0–44)
AST: 32 U/L (ref 15–41)
Albumin: 2.9 g/dL — ABNORMAL LOW (ref 3.5–5.0)
Alkaline Phosphatase: 102 U/L (ref 38–126)
Anion gap: 9 (ref 5–15)
BUN: 51 mg/dL — ABNORMAL HIGH (ref 8–23)
CO2: 30 mmol/L (ref 22–32)
Calcium: 9 mg/dL (ref 8.9–10.3)
Chloride: 96 mmol/L — ABNORMAL LOW (ref 98–111)
Creatinine, Ser: 2.52 mg/dL — ABNORMAL HIGH (ref 0.61–1.24)
GFR, Estimated: 28 mL/min — ABNORMAL LOW (ref >60)
Glucose, Bld: 194 mg/dL — ABNORMAL HIGH (ref 70–99)
Potassium: 4.2 mmol/L (ref 3.5–5.1)
Sodium: 135 mmol/L (ref 135–145)
Total Bilirubin: 0.3 mg/dL (ref 0.3–1.2)
Total Protein: 7.2 g/dL (ref 6.5–8.1)

## 2021-07-07 LAB — CBC WITH DIFFERENTIAL/PLATELET
Abs Immature Granulocytes: 0.11 10*3/uL — ABNORMAL HIGH (ref 0.00–0.07)
Basophils Absolute: 0.1 10*3/uL (ref 0.0–0.1)
Basophils Relative: 1 %
Eosinophils Absolute: 0.2 10*3/uL (ref 0.0–0.5)
Eosinophils Relative: 3 %
HCT: 30.7 % — ABNORMAL LOW (ref 39.0–52.0)
Hemoglobin: 9.2 g/dL — ABNORMAL LOW (ref 13.0–17.0)
Immature Granulocytes: 1 %
Lymphocytes Relative: 25 %
Lymphs Abs: 2.3 10*3/uL (ref 0.7–4.0)
MCH: 27.4 pg (ref 26.0–34.0)
MCHC: 30 g/dL (ref 30.0–36.0)
MCV: 91.4 fL (ref 80.0–100.0)
Monocytes Absolute: 0.6 10*3/uL (ref 0.1–1.0)
Monocytes Relative: 6 %
Neutro Abs: 5.7 10*3/uL (ref 1.7–7.7)
Neutrophils Relative %: 64 %
Platelets: 292 10*3/uL (ref 150–400)
RBC: 3.36 MIL/uL — ABNORMAL LOW (ref 4.22–5.81)
RDW: 15.8 % — ABNORMAL HIGH (ref 11.5–15.5)
WBC: 8.9 10*3/uL (ref 4.0–10.5)
nRBC: 0 % (ref 0.0–0.2)

## 2021-07-07 LAB — D-DIMER, QUANTITATIVE: D-Dimer, Quant: 3.65 ug/mL-FEU — ABNORMAL HIGH (ref 0.00–0.50)

## 2021-07-07 LAB — RESP PANEL BY RT-PCR (FLU A&B, COVID) ARPGX2
Influenza A by PCR: NEGATIVE
Influenza B by PCR: NEGATIVE
SARS Coronavirus 2 by RT PCR: NEGATIVE

## 2021-07-07 LAB — GLUCOSE, CAPILLARY
Glucose-Capillary: 181 mg/dL — ABNORMAL HIGH (ref 70–99)
Glucose-Capillary: 205 mg/dL — ABNORMAL HIGH (ref 70–99)

## 2021-07-07 LAB — MAGNESIUM: Magnesium: 1.8 mg/dL (ref 1.7–2.4)

## 2021-07-07 MED ORDER — OXYCODONE HCL 5 MG PO CAPS
5.0000 mg | ORAL_CAPSULE | Freq: Three times a day (TID) | ORAL | 0 refills | Status: DC | PRN
Start: 2021-07-07 — End: 2021-09-06

## 2021-07-07 MED ORDER — NICOTINE 21 MG/24HR TD PT24: 21.0000 mg | MEDICATED_PATCH | Freq: Every day | TRANSDERMAL | 0 refills | Status: DC

## 2021-07-07 MED ORDER — PREGABALIN 150 MG PO CAPS: 150.0000 mg | ORAL_CAPSULE | Freq: Two times a day (BID) | ORAL | 0 refills | Status: DC

## 2021-07-07 MED ORDER — OXYCODONE HCL 30 MG PO TABS: 30.0000 mg | ORAL_TABLET | Freq: Three times a day (TID) | ORAL | 0 refills | Status: DC

## 2021-07-07 MED ORDER — TORSEMIDE 40 MG PO TABS: 40.0000 mg | ORAL_TABLET | Freq: Every day | ORAL | Status: DC

## 2021-07-07 MED ORDER — TORSEMIDE 40 MG PO TABS: 30.0000 mg | ORAL_TABLET | Freq: Every day | ORAL | Status: DC

## 2021-07-07 NOTE — Discharge Summary (Signed)
Gregory [name] GGY:[id number] DOB: [birth year] DOA: 06/26/2021  PCP: [name], MD  Admit date: 06/26/2021  Discharge date: 07/07/2021  Admitted From: SNF   Disposition:  SNF   Recommendations for Outpatient Follow-up:   Follow up with PCP in 1-2 weeks  PCP Please obtain BMP/CBC, 2 view CXR in 1week,  (see Discharge instructions)   PCP Please follow up on the following pending results: Please monitor narcotic use judiciously very high risk for aspiration.   Home Health: None Equipment/Devices: None  Consultations: PCCM Discharge Condition: guardrd CODE STATUS: Full    Diet Recommendation: Heart Healthy low carbohydrate    Chief Complaint  Patient presents with   Shortness of Breath   Fever   Cough     Brief history of present illness from the day of admission and additional interim summary    Patient is a 61 y.o. male with history of DM-2, HTN, chronic HFpEF, s/p right AKA, COPD-who was admitted to [facility] service on 1/9 with acute hypoxic respiratory failure due to community-acquired pneumonia.   VQ scan.  Despite extensive counseling patient refused x2 assumes all responsibility.  Nurse [place] bedside.       TTE -  1. Left ventricular ejection fraction, by estimation, is 50 to 55%. The left ventricle has low normal function. The left ventricle has no regional wall motion abnormalities. There is mild left ventricular hypertrophy. Left ventricular diastolic  parameters were normal.   2. Right ventricular systolic function is normal. The right ventricular size is mildly enlarged. Tricuspid regurgitation signal is inadequate for assessing PA pressure.   3. The mitral valve is normal in structure. No evidence of mitral valve regurgitation. No evidence of mitral stenosis.   4. The aortic valve was not  well visualized. Aortic valve regurgitation is trivial. Aortic valve sclerosis/calcification is present, without any evidence of aortic stenosis.   5. Aortic dilatation noted. There is dilatation of the aortic root, measuring 40 mm.   6. The inferior vena cava is dilated in size with >50% respiratory variability, suggesting right atrial pressure of 8 mmHg.   Leg [place] - no DVT   CT -  1. Small bilateral pleural effusions and associated atelectasis or consolidation. 2. Interlobular septal thickening throughout, most suggestive of edema. 3. Geographic airspace attenuation throughout, suggestive of small airways disease 4. Enlarged mediastinal lymph nodes, slightly increased in size compared to prior examination, most likely reactive. 5. Coronary artery disease. Aortic Atherosclerosis                                                                  Hospital Course    SpO2: 95 % O2 Flow Rate (L/min): 3 L/min FiO2 (%): 30 %  Acute respiratory failure with hypoxia (HCC)- (present on admission) Due to aspiration pneumonia versus  community-acquired pneumonia.  He has finished azithromycin treatment, stop Rocephin after 07/05/2021,, speech therapy following, still against excessive narcotic use which could be causing narcotic bowel, nausea vomiting and aspiration.  Also in fluid overload which is improving with diuretics.  CT noncontrast chest noted.  Clinically improved from heated high flow 40 L down to 3 L.  At this point he can be discharged to SNF with 3 L oxygen, continue emphasis on pulmonary toiletry, reducing narcotic use, eating all meals sitting up in the chair and avoiding further aspiration.  Monitor electrolytes and fluid status closely along with diuretic dose   D-dimer has come back borderline elevated, lower extremity venous duplex unremarkable.  Cannot do CTA due to renal dysfunction.  Echocardiogram noted with minimally elevated RV pressures.  VQ scan ordered but patient refused twice  despite extensive counseling first on 07/05/2021 then again on 07/06/2021.  He is accepts all responsibility for missing a PE with resulting death or disability.  Nurse for the patient [place] in the room during this conversation kindly see documentation by him.     Community acquired pneumonia Probably atypical-however COVID/influenza PCR/respiratory virus panel/sputum/blood cultures are all negative.  He has finished antibiotic treatment   Chronic pain- (present on admission) Continue Neurontin/Cymbalta/Lyrica and oxycodone.  Oxycodone dose increased, fentanyl patch applied upon patient's request, I am concerned about accidental narcotic overdose, I have counseled the patient not to use more narcotics than needed.  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  Patient has been extensively counseled.   He continues to refuse lab work and CT scan despite being counseled multiple times, he is comfortable at all times but still calling staff every 15 to 20 minutes and asking for more narcotics.  He has narcotic bowel full of stool and I am highly concerned about an accidental overdose, ongoing microaspiration due to narcotic bowel and nausea vomiting.  I have expressed my concerns clearly to patient and daughter, patient has been warned that this kind of behavior can lead to accidental overdose, narcotic bowel, continued nausea vomiting, worsening of pneumonia and even death.  With Movantik use he is having regular bowel movements now and nausea vomiting is improved.  Continue bowel management as appropriate.  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  Counseled against excessive narcotic use  again.  Upon discharge continue SNF bowel regimen.     Asymptomatic bacteriuria- (present on admission) Clinically no UTI  Normocytic anemia- (present on admission) Probably related to CKD-worsened with acute illness.  Stable no transfusion needed.   Tobacco use- (present on admission) Continue transdermal nicotine.   Stage 3b chronic kidney disease (CKD) ([place])- (present on admission) Baseline creatinine around 2-2.5.  Close to baseline monitor renal function closely with diuretics avoid nephrotoxins    S/P AKA (above knee amputation) unilateral, right ([place]) stable or stump site.   Lymphedema of left leg On diuretics.   Pressure injury, stage 3 ([place])- (present on admission) Wound care team following  HLD (hyperlipidemia)- (present on admission) Continue Crestor    HTN (hypertension)- (present on admission) BP stable-on home regimen   Chronic diastolic CHF (congestive heart failure) ([place])- (present on admission) Do not think he has decompensated heart failure  at this point-has lymphedema at baseline-remains on scheduled Demadex-continue to maintain negative balance.  Echo on 1/9 with preserved EF.  Monitor electrolytes closely at SNF and adjust diuretic dose as needed   COPD (chronic obstructive pulmonary disease) ([place])- (present on admission) Not in exacerbation-continue bronchodilators  Type II diabetes mellitus with neurological manifestations ([place])- (present on admission) Continue SNF regimen monitor CBGs q. ACH S and adjust insulin dose as needed.   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  All meals sitting up.  Preferably in the chair.  CBGs q. [place] monitor CBGs closely.  Special Instructions: If you have smoked or chewed Tobacco  in the last 2 yrs please stop smoking, stop any regular Alcohol  and or any Recreational drug use.  On your next visit with your primary care physician please Get Medicines reviewed and adjusted.  Please request your Prim.MD to go over all Hospital Tests and Procedure/Radiological results at the follow up, please get all Hospital records sent to your Prim MD by signing hospital release before you go home.  If you experience worsening of your admission symptoms, develop shortness of breath, life threatening emergency, suicidal or homicidal thoughts you must seek medical attention immediately by calling 911 or calling your MD immediately  if symptoms less severe.  You Must read complete instructions/literature along with all the possible adverse reactions/side effects for all the Medicines you take and that have been prescribed to you. Take any new Medicines after you have completely understood and accpet all the possible adverse reactions/side effects.   Increase activity slowly   Complete by: As directed    No wound care   Complete by: As directed         Discharge Medications   Allergies as of 07/07/2021   No Known Allergies      Medication List     STOP taking these medications    furosemide 20 MG tablet Commonly known as: LASIX   furosemide 40 MG tablet Commonly known as: LASIX       TAKE these medications    acetaminophen 325 MG tablet Commonly known as: TYLENOL Take 650 mg by mouth every 4 (four) hours as needed (general discomfort).   albuterol 108 (90 Base) MCG/ACT inhaler Commonly known as: VENTOLIN HFA Inhale 2 puffs into  the lungs every 6 (six) hours as needed for wheezing or shortness of breath.   alum & mag hydroxide-simeth [phone] MG/5ML suspension Commonly known as: MAALOX PLUS Take 10 mLs by mouth every 4 (four) hours as needed for indigestion (heartburn,nausea,gas).   amLODipine 10 MG tablet Commonly known as: NORVASC Take 10 mg by mouth daily. for HTN   Biofreeze 4 % Gel Generic drug: Menthol (Topical Analgesic) Apply 1 application topically every 8 (eight) hours as needed (pain to right leg/stump).   buPROPion 300 MG 24 hr tablet Commonly known as: WELLBUTRIN XL Take 300 mg by mouth daily.   cetirizine 10 MG tablet Commonly known as: ZYRTEC Take 10 mg by mouth daily.   diclofenac Sodium 1 % Gel Commonly known as: VOLTAREN Apply 4 g topically every 6 (six) hours as needed (right leg pain).   docusate sodium 100 MG capsule Commonly known as: COLACE Take 1 capsule (100 mg total) by mouth 2 (two) times daily.   DULoxetine 60 MG capsule Commonly known as: CYMBALTA Take 60 mg by mouth daily.   Eszopiclone 3 MG Tabs Take 3 mg by mouth at bedtime.   ferrous sulfate 325 (65 FE) MG tablet Take 1 tablet (325 mg total) by mouth daily with breakfast.   gabapentin 300 MG capsule Commonly known as: NEURONTIN Take 300 mg by mouth 3 (three) times daily.   glipiZIDE 10 MG tablet Commonly known as: GLUCOTROL Take 10 mg by mouth daily before breakfast.   GLUCAGEN IJ Inject 1 mg into  the muscle as needed (hyperglycemia).   hydrocortisone cream 1 % Apply 1 application topically as needed (itching on face).   hydrOXYzine 25 MG tablet Commonly known as: ATARAX Take 25 mg by mouth every 6 (six) hours as needed for itching.   insulin glargine 100 UNIT/ML injection Commonly known as: LANTUS Inject 14 Units into the skin at bedtime.   insulin lispro 100 UNIT/ML KwikPen Commonly known as: HUMALOG Inject 0-10 Units into the skin See admin instructions. Per sliding scale before meals and at bedtime. <70 or >400 = call md 151-200 = 2 units 201-50 = 4 units 251-300 = 6 units 301-350 =  8 units 351-400 = 10 units   Lidocaine 4 % Ptch Apply 1 patch topically daily.   magnesium hydroxide 400 MG/5ML suspension Commonly known as: MILK OF MAGNESIA Take 30 mLs by mouth at bedtime as needed for mild constipation.   magnesium oxide 400 MG tablet Commonly known as: MAG-OX Take 400 mg by mouth 2 (two) times daily.   metoprolol tartrate 25 MG tablet Commonly known as: LOPRESSOR Take 0.5 tablets (12.5 mg total) by mouth 2 (two) times daily.   Narcan 4 MG/0.1ML Liqd nasal spray kit Generic drug: naloxone Place 1 spray into the nose as needed (poorly responsive or turning blue).   nicotine 21 mg/24hr patch Commonly known as: NICODERM CQ - dosed in mg/24 hours Place 1 patch (21 mg total) onto the skin daily. Start taking on: July 08, 2021   NON FORMULARY 1 application See admin instructions. Compression stockings to left leg, leave on for 12 hours then remove   NON FORMULARY 1 application See admin instructions. Ice Pack under right eye every hours x 20 minutes as needed for swelling   nystatin cream Commonly known as: MYCOSTATIN Apply 1 application topically as needed (ro periwound).   omeprazole 20 MG capsule Commonly known as: PRILOSEC Take 20 mg by mouth at bedtime.   ondansetron 4 MG tablet Commonly known  as: ZOFRAN Take 4 mg by mouth every 8 (eight)  hours as needed for nausea.   OVER THE COUNTER MEDICATION Take 30 mLs by mouth in the morning and at bedtime. Pro Heal   oxybutynin 10 MG 24 hr tablet Commonly known as: DITROPAN-XL Take 10 mg by mouth daily.   oxycodone 5 MG capsule Commonly known as: OXY-IR Take 1 capsule (5 mg total) by mouth every 8 (eight) hours as needed for pain.   oxycodone 30 MG immediate release tablet Commonly known as: ROXICODONE Take 1 tablet (30 mg total) by mouth every 8 (eight) hours.   phenazopyridine 200 MG tablet Commonly known as: PYRIDIUM Take 200 mg by mouth every 8 (eight) hours as needed (urinary pain).   pregabalin 150 MG capsule Commonly known as: LYRICA Take 1 capsule (150 mg total) by mouth 2 (two) times daily.   psyllium 58.6 % packet Commonly known as: METAMUCIL Take 1 packet by mouth daily as needed (constipation).   ROBAFEN PO Take 10 mLs by mouth every 4 (four) hours as needed (cough).   rOPINIRole 0.25 MG tablet Commonly known as: REQUIP Take 0.5 mg by mouth 3 (three) times daily.   rosuvastatin 10 MG tablet Commonly known as: CRESTOR Take 10 mg by mouth at bedtime.   senna-docusate 8.6-50 MG tablet Commonly known as: Senokot-S Take 1 tablet by mouth at bedtime.   simethicone 80 MG chewable tablet Commonly known as: MYLICON Chew 80 mg by mouth every 6 (six) hours as needed for flatulence.   tamsulosin 0.4 MG Caps capsule Commonly known as: FLOMAX Take 0.4 mg by mouth every evening.   Tiotropium Bromide Monohydrate 1.25 MCG/ACT Aers Inhale 2 puffs into the lungs daily.   tiZANidine 4 MG tablet Commonly known as: ZANAFLEX Take 4 mg by mouth every 6 (six) hours as needed for muscle spasms.   Torsemide 40 MG Tabs Take 30 mg by mouth daily. Start taking on: July 08, 2021   triamcinolone cream 0.1 % Commonly known as: KENALOG Apply 1 application topically 2 (two) times daily. Left lower leg What changed: Another medication with the same name was removed.  Continue taking this medication, and follow the directions you see here.   vitamin B-12 500 MCG tablet Commonly known as: CYANOCOBALAMIN Take 1,000 mcg by mouth daily.         Follow-up Information     [name], MD. Schedule an appointment as soon as possible for a visit in 1 week(s).   Specialty: Internal Medicine Contact information: [address] [place] [address] [phone]                 Major procedures and Radiology Reports - PLEASE review detailed and final reports thoroughly  -       CT CHEST WO CONTRAST  Result Date: 07/04/2021 CLINICAL DATA:  Pneumonia, shortness of breath, fever, cough EXAM: CT CHEST WITHOUT CONTRAST TECHNIQUE: Multidetector CT imaging of the chest was performed following the standard protocol without IV contrast. RADIATION DOSE REDUCTION: This exam was performed according to the departmental dose-optimization program which includes automated exposure control, adjustment of the mA and/or kV according to patient size and/or use of iterative reconstruction technique. COMPARISON:  CT chest, 10/20/2020 FINDINGS: Cardiovascular: Aortic atherosclerosis. Normal heart size. Left and right coronary artery calcifications. No pericardial effusion. Mediastinum/Nodes: Enlarged mediastinal lymph nodes, largest pretracheal nodes measuring up to 2.3 x 1.4 cm, slightly increased in size compared to prior examination (series 3, image 47). Thyroid gland, trachea,  and esophagus demonstrate no significant findings. Lungs/Pleura: Small bilateral pleural effusions and associated atelectasis or consolidation. Interlobular septal thickening throughout. Geographic airspace attenuation throughout. Upper Abdomen: No acute abnormality. Musculoskeletal: No chest wall abnormality. No suspicious osseous lesions identified. IMPRESSION: 1. Small bilateral pleural effusions and associated atelectasis or consolidation. 2. Interlobular septal thickening throughout, most  suggestive of edema. 3. Geographic airspace attenuation throughout, suggestive of small airways disease 4. Enlarged mediastinal lymph nodes, slightly increased in size compared to prior examination, most likely reactive. 5. Coronary artery disease. Aortic Atherosclerosis (ICD10-I70.0). Electronically Signed   By: Delanna Ahmadi M.D.   On: 07/04/2021 13:04   DG Chest Port 1 View  Result Date: 07/05/2021 CLINICAL DATA:  Shortness of breath EXAM: PORTABLE CHEST 1 VIEW COMPARISON:  07/03/2021 FINDINGS: Persistent interstitial changes. Remains right suprahilar atelectasis/consolidation. No significant pleural effusion. No pneumothorax. Similar cardiomediastinal contours. IMPRESSION: Persistent interstitial changes may reflect edema. Remains right suprahilar atelectasis/consolidation. Electronically Signed   By: Macy Mis M.D.   On: 07/05/2021 10:14   DG Chest Port 1 View  Result Date: 07/03/2021 CLINICAL DATA:  Shortness of breath EXAM: PORTABLE CHEST 1 VIEW COMPARISON:  07/01/2021 FINDINGS: Cardiomegaly. Slightly improved diffuse bilateral interstitial pulmonary opacity pulmonary vascular prominence. No new or focal airspace opacity. IMPRESSION: Slightly improved diffuse bilateral interstitial pulmonary opacity and pulmonary vascular prominence, consistent with improved edema. No new or focal airspace opacity. Electronically Signed   By: Delanna Ahmadi M.D.   On: 07/03/2021 08:14   DG CHEST PORT 1 VIEW  Result Date: 06/27/2021 CLINICAL DATA:  Shortness of breath, cough, fever EXAM: PORTABLE CHEST 1 VIEW COMPARISON:  Previous studies including the examination of 06/26/2021 FINDINGS: Transverse diameter of heart is increased. There is interval worsening of infiltrates in the right upper lung fields and right parahilar region. There is some improvement in aeration of left parahilar region. There is evidence of previous surgical fusion in the cervical spine at multiple levels. IMPRESSION: There is interval  worsening of infiltrates in the right upper lung fields and right parahilar region suggesting worsening of asymmetric pulmonary edema or worsening of pneumonia. There is some improvement in aeration of left parahilar region. Electronically Signed   By: Elmer Picker M.D.   On: 06/27/2021 08:07   DG Chest Port 1 View  Result Date: 06/26/2021 CLINICAL DATA:  Sepsis. EXAM: PORTABLE CHEST 1 VIEW COMPARISON:  Chest radiograph dated 07/11/2019. CT dated 10/19/2020. FINDINGS: Mild cardiomegaly. Diffuse bilateral interstitial and airspace densities may represent pulmonary edema, pneumonia, or combination. Clinical correlation is recommended. No pleural effusion pneumothorax. No acute osseous pathology. Cervical fusion. IMPRESSION: Diffuse bilateral interstitial and airspace densities may represent pulmonary edema, pneumonia, or combination. Electronically Signed   By: Anner Crete M.D.   On: 06/26/2021 02:07   DG Chest Port 1V same Day  Result Date: 07/01/2021 CLINICAL DATA:  Follow-up for shortness of breath/pneumonia. EXAM: PORTABLE CHEST 1 VIEW COMPARISON:  06/27/2021 and older exams. FINDINGS: Since the most recent prior study, there has been improvement in lung aeration. Bilateral interstitial and airspace opacities have improved. There are persistent opacities, most evident centrally and at the left lung base. No new lung abnormalities. Possible small effusions. No pneumothorax. Cardiac silhouette normal in size. IMPRESSION: Interval improvement in lung aeration with decreased interstitial and airspace lung opacities. Findings consistent with improved multifocal pneumonia or improved edema. Electronically Signed   By: Lajean Manes M.D.   On: 07/01/2021 10:25   DG Abd Portable 1V  Result Date: 07/07/2021 CLINICAL DATA:  Nausea EXAM: PORTABLE ABDOMEN - 1 VIEW COMPARISON:  07/03/2021 FINDINGS: Mild gaseous distension of the small bowel colon unchanged from the prior exam. Moderate amount of stool  throughout the colon. No bowel dilatation to suggest obstruction. No evidence of pneumoperitoneum, portal venous gas or pneumatosis. No pathologic calcifications along the expected course of the ureters. No acute osseous abnormality. IMPRESSION: Moderate amount of stool throughout the colon. Electronically Signed   By: Kathreen Devoid M.D.   On: 07/07/2021 06:48   DG Abd Portable 1V  Result Date: 07/03/2021 CLINICAL DATA:  Nausea and vomiting. EXAM: PORTABLE ABDOMEN - 1 VIEW COMPARISON:  04/25/2018 FINDINGS: There is a moderate stool burden identified within the colon and rectum. No dilated small bowel loops identified. No signs of obstruction. IMPRESSION: Moderate stool burden within the colon and rectum compatible with constipation. Electronically Signed   By: Kerby Moors M.D.   On: 07/03/2021 11:01   ECHOCARDIOGRAM COMPLETE  Result Date: 06/26/2021    ECHOCARDIOGRAM REPORT   Patient Name:   Gregory [name] Date of Exam: 06/26/2021 Medical Rec #:  [id number]    Height:       72.0 in Accession #:    [id number]   Weight:       246.7 lb Date of Birth:  1961/04/19   BSA:          2.329 m Patient Age:    40 years     BP:           123/61 mmHg Patient Gender: M            HR:           69 bpm. Exam Location:  Inpatient Procedure: 2D Echo, Color Doppler, Cardiac Doppler and Intracardiac            Opacification Agent Indications:    CHF  History:        Patient has prior history of Echocardiogram examinations. COPD;                 Risk Factors:Diabetes and Dyslipidemia.  Sonographer:    Jefferey Pica Referring Phys: [id number] [place]  1. Left ventricular ejection fraction, by estimation, is 50 to 55%. The left ventricle has low normal function. The left ventricle has no regional wall motion abnormalities. There is mild left ventricular hypertrophy. Left ventricular diastolic parameters were normal.  2. Right ventricular systolic function is normal. The right ventricular size is mildly enlarged.  Tricuspid regurgitation signal is inadequate for assessing PA pressure.  3. The mitral valve is normal in structure. No evidence of mitral valve regurgitation. No evidence of mitral stenosis.  4. The aortic valve was not well visualized. Aortic valve regurgitation is trivial. Aortic valve sclerosis/calcification is present, without any evidence of aortic stenosis.  5. Aortic dilatation noted. There is dilatation of the aortic root, measuring 40 mm.  6. The inferior vena cava is dilated in size with >50% respiratory variability, suggesting right atrial pressure of 8 mmHg. FINDINGS  Left Ventricle: Left ventricular ejection fraction, by estimation, is 50 to 55%. The left ventricle has low normal function. The left ventricle has no regional wall motion abnormalities. The left ventricular internal cavity size was normal in size. There is mild left ventricular hypertrophy. Left ventricular diastolic parameters were normal. Right Ventricle: The right ventricular size is mildly enlarged. Right vetricular wall thickness was not well visualized. Right ventricular systolic function is normal. Tricuspid regurgitation signal is inadequate for assessing PA pressure. Left  Atrium: Left atrial size was normal in size. Right Atrium: Right atrial size was not well visualized. Pericardium: There is no evidence of pericardial effusion. Mitral Valve: The mitral valve is normal in structure. No evidence of mitral valve regurgitation. No evidence of mitral valve stenosis. Tricuspid Valve: The tricuspid valve is normal in structure. Tricuspid valve regurgitation is trivial. Aortic Valve: The aortic valve was not well visualized. Aortic valve regurgitation is trivial. Aortic regurgitation PHT measures 476 msec. Aortic valve sclerosis/calcification is present, without any evidence of aortic stenosis. Aortic valve peak gradient measures 15.1 mmHg. Pulmonic Valve: The pulmonic valve was not well visualized. Pulmonic valve regurgitation is not  visualized. Aorta: Aortic dilatation noted. There is dilatation of the aortic root, measuring 40 mm. Venous: The inferior vena cava is dilated in size with greater than 50% respiratory variability, suggesting right atrial pressure of 8 mmHg. IAS/Shunts: The interatrial septum was not well visualized.  LEFT VENTRICLE PLAX 2D LVIDd:         5.24 cm   Diastology LVIDs:         3.73 cm   LV e' medial:    8.38 cm/s LV PW:         1.10 cm   LV E/e' medial:  10.4 LV IVS:        1.13 cm   LV e' lateral:   11.00 cm/s LVOT diam:     2.10 cm   LV E/e' lateral: 8.0 LV SV:         109 LV SV Index:   47 LVOT Area:     3.46 cm  RIGHT VENTRICLE RV S prime:     19.10 cm/s TAPSE (M-mode): 3.1 cm LEFT ATRIUM             Index LA diam:        4.10 cm 1.76 cm/m LA Vol (A2C):   60.5 ml 25.97 ml/m LA Vol (A4C):   59.4 ml 25.50 ml/m LA Biplane Vol: 61.1 ml 26.23 ml/m  AORTIC VALVE                 PULMONIC VALVE AV Area (Vmax): 2.85 cm     PV Vmax:       1.11 m/s AV Vmax:        194.50 cm/s  PV Peak grad:  4.9 mmHg AV Peak Grad:   15.1 mmHg LVOT Vmax:      160.00 cm/s LVOT Vmean:     97.100 cm/s LVOT VTI:       0.316 m AI PHT:         476 msec  AORTA Ao Root diam: 4.00 cm Ao Asc diam:  3.10 cm MITRAL VALVE MV Area (PHT): 4.49 cm    SHUNTS MV Decel Time: 169 msec    Systemic VTI:  0.32 m MV E velocity: 87.50 cm/s  Systemic Diam: 2.10 cm MV A velocity: 75.30 cm/s MV E/A ratio:  1.16 Oswaldo Milian MD Electronically signed by Oswaldo Milian MD Signature Date/Time: 06/26/2021/3:21:28 PM    Final    VAS [place] LOWER EXTREMITY VENOUS (DVT)  Result Date: 07/05/2021  Lower Venous DVT Study Patient Name:  Gregory [name]  Date of Exam:   07/05/2021 Medical Rec #: [id number]     Accession #:    [id number] Date of Birth: 05/17/1961    Patient Gender: M Patient Age:   20 years Exam Location:  [facility] Procedure:      VAS [place] LOWER EXTREMITY  VENOUS (DVT) Referring Phys: Deno Etienne [facility]  --------------------------------------------------------------------------------  Indications: Elevated d-dimer,.  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 Limitations: Skin changes left calf. Comparison Study: 07-14-2019 Lower extremity venous was negative for DVT. Performing Technologist: Darlin Coco RDMS, RVT  Examination Guidelines: A complete evaluation includes B-mode imaging, spectral Doppler, color Doppler, and power Doppler as needed of all accessible portions of each vessel. Bilateral testing is considered an integral part of a complete examination. Limited examinations for reoccurring indications may be performed as noted. The reflux portion of the exam is performed with the patient in reverse Trendelenburg.  +---------+---------------+---------+-----------+----------+--------------+  RIGHT     Compressibility Phasicity Spontaneity Properties Thrombus Aging  +---------+---------------+---------+-----------+----------+--------------+  CFV       Full            Yes       Yes                                    +---------+---------------+---------+-----------+----------+--------------+  SFJ       Full                                                             +---------+---------------+---------+-----------+----------+--------------+  FV Prox   Full                                                             +---------+---------------+---------+-----------+----------+--------------+  FV Mid                                                     AKA             +---------+---------------+---------+-----------+----------+--------------+  FV Distal                                                  AKA             +---------+---------------+---------+-----------+----------+--------------+  PFV       Full                                                             +---------+---------------+---------+-----------+----------+--------------+   +---------+---------------+---------+-----------+----------+--------------+  LEFT       Compressibility Phasicity Spontaneity Properties Thrombus Aging  +---------+---------------+---------+-----------+----------+--------------+  CFV       Full            Yes       Yes                                    +---------+---------------+---------+-----------+----------+--------------+  SFJ       Full                                                             +---------+---------------+---------+-----------+----------+--------------+  FV Prox   Full                                                             +---------+---------------+---------+-----------+----------+--------------+  FV Mid    Full                                                             +---------+---------------+---------+-----------+----------+--------------+  FV Distal Full                                                             +---------+---------------+---------+-----------+----------+--------------+  PFV       Full                                                             +---------+---------------+---------+-----------+----------+--------------+  POP       Full            Yes       Yes                                    +---------+---------------+---------+-----------+----------+--------------+  PTV       Full                                                             +---------+---------------+---------+-----------+----------+--------------+  PERO      Full                                                             +---------+---------------+---------+-----------+----------+--------------+  Gastroc   Full                                                             +---------+---------------+---------+-----------+----------+--------------+  Summary: RIGHT: - No evidence of common femoral vein obstruction.  LEFT: - There is no evidence of deep vein thrombosis in the lower extremity.  - No cystic structure found in the popliteal fossa.  - Ultrasound characteristics of enlarged lymph nodes noted in the groin.  *See  table(s) above for measurements and observations. Electronically signed by Jamelle Haring on 07/05/2021 at 6:08:49 PM.    Final       Today   Subjective    Gregory [name] today has no headache,no chest abdominal pain,no new weakness tingling or numbness, feels much better     Objective   Blood pressure 136/81, pulse 63, temperature (!) 97.5 F (36.4 C), temperature source Oral, resp. rate 10, height 6' (1.829 m), weight 102.2 kg, SpO2 95 %.   Intake/Output Summary (Last 24 hours) at 07/07/2021 1107 Last data filed at 07/07/2021 0930 Gross per 24 hour  Intake 580 ml  Output 400 ml  Net 180 ml    Exam  Awake Alert, No new F.N deficits,    [place].AT,PERRAL Supple Neck,   Symmetrical Chest wall movement, Good air movement bilaterally, CTAB RRR,No Gallops,   +ve B.Sounds, Abd Soft, Non tender,  No Cyanosis, Clubbing or edema    Data Review   CBC w Diff:  Lab Results  Component Value Date   WBC 8.9 07/07/2021   HGB 9.2 (L) 07/07/2021   HGB 10.7 (L) 02/24/2020   HCT 30.7 (L) 07/07/2021   PLT 292 07/07/2021   PLT 221 02/24/2020   LYMPHOPCT 25 07/07/2021   BANDSPCT 0 06/05/2018   MONOPCT 6 07/07/2021   EOSPCT 3 07/07/2021   BASOPCT 1 07/07/2021    CMP:  Lab Results  Component Value Date   NA 135 07/07/2021   NA 138 10/22/2016   K 4.2 07/07/2021   CL 96 (L) 07/07/2021   CO2 30 07/07/2021   BUN 51 (H) 07/07/2021   BUN 26 (A) 10/22/2016   CREATININE 2.52 (H) 07/07/2021   CREATININE 1.95 (H) 02/24/2020   CREATININE 2.67 (H) 10/02/2016   GLU 154 10/22/2016   PROT 7.2 07/07/2021   ALBUMIN 2.9 (L) 07/07/2021   BILITOT 0.3 07/07/2021   BILITOT 0.3 02/24/2020   ALKPHOS 102 07/07/2021   AST 32 07/07/2021   AST 8 (L) 02/24/2020   ALT 55 (H) 07/07/2021   ALT 8 02/24/2020  .   Total Time in preparing paper work, data evaluation and todays exam - 78 minutes  Lala Lund M.D on 07/07/2021 at 11:07 AM  [facility]

## 2021-07-07 NOTE — TOC Progression Note (Signed)
Transition of Care [facility]) - Progression Note    Patient Details  Name: Gregory [name] MRN: [id number] Date of Birth: [birth year]  Transition of Care [facility]) CM/SW [place], LCSW Phone Number: 07/07/2021, 9:39 AM  Clinical Narrative:    CSW spoke with Onalee Hua at [facility]) and made her aware that patient may be ready to discharge tomorrow.    Expected Discharge Plan: Skilled Nursing Facility Barriers to Discharge: Continued Medical Work up  Expected Discharge Plan and Services Expected Discharge Plan: [place] In-house Referral: Clinical Social Work   Post Acute Care Choice: [place] Living arrangements for the past 2 months: [place]                                       Social Determinants of Health (SDOH) Interventions    Readmission Risk Interventions Readmission Risk Prevention Plan 07/03/2021 06/23/2019 12/07/2018  Transportation Screening Complete Complete Complete  PCP or Specialist Appt within 3-5 Days - Not Complete -  Not Complete comments - SNF pt -  HRI or [place] - Complete -  Social Work Consult for [place] Planning/Counseling - Complete -  Palliative Care Screening - Not Applicable -  Medication Review (RN Care Manager) Referral to Pharmacy Referral to Pharmacy Complete  PCP or Specialist appointment within 3-5 days of discharge Complete Not Complete Complete  PCP/Specialist Appt Not Complete comments - SNF pt -  HRI or Home Care Consult Complete Complete Not Complete  HRI or Home Care Consult Pt Refusal Comments - - pt from SNF LTC  SW Recovery Care/Counseling Consult Complete Complete Complete  Palliative Care Screening Not Applicable Not Applicable Not Applicable  Skilled Nursing Facility Complete Complete Complete  Some recent data might be hidden

## 2021-07-07 NOTE — TOC Transition Note (Signed)
Transition of Care [facility]) - CM/SW Discharge Note   Patient Details  Name: Gregory [name] MRN: [id number] Date of Birth: Apr 19, 1961  Transition of Care [facility]) CM/SW Contact:  Benard [place], LCSW Phone Number: 07/07/2021, 11:56 AM   Clinical Narrative:    Patient will DC to: [facility] ([place]) Anticipated DC date: 07/07/21 Family notified: Pt declined Transport by: Corey Harold   Per MD patient ready for DC to [place]. RN to call report prior to discharge [phone]). RN, patient, patient's family, and facility notified of DC. Discharge Summary and FL2 sent to facility. DC packet on chart. Ambulance transport requested for patient.   CSW will sign off for now as social work intervention is no longer needed. Please consult [place] again if new needs arise.     Final next level of care: Skilled Nursing Facility Barriers to Discharge: Barriers Resolved   Patient Goals and CMS Choice Patient states their goals for this hospitalization and ongoing recovery are:: Feel better CMS Medicare.gov Compare Post Acute Care list provided to:: Patient Choice offered to / list presented to : Patient  Discharge Placement   Existing PASRR number confirmed : 07/07/21          Patient chooses bed at: [facility] Patient to be transferred to facility by: [place] Name of family member notified: Pt notifying family Patient and family notified of of transfer: 07/07/21  Discharge Plan and Services In-house Referral: Clinical Social Work   Post Acute Care Choice: [place]                               Social Determinants of Health (SDOH) Interventions     Readmission Risk Interventions Readmission Risk Prevention Plan 07/03/2021 06/23/2019 12/07/2018  Transportation Screening Complete Complete Complete  PCP or Specialist Appt within 3-5 Days - Not Complete -  Not Complete comments - SNF pt -  HRI or [place] - Complete -  Social Work  Consult for [place] Planning/Counseling - Complete -  Palliative Care Screening - Not Applicable -  Medication Review (RN Care Manager) Referral to Pharmacy Referral to Pharmacy Complete  PCP or Specialist appointment within 3-5 days of discharge Complete Not Complete Complete  PCP/Specialist Appt Not Complete comments - SNF pt -  HRI or Home Care Consult Complete Complete Not Complete  HRI or Home Care Consult Pt Refusal Comments - - pt from SNF LTC  SW Recovery Care/Counseling Consult Complete Complete Complete  Palliative Care Screening Not Applicable Not Applicable Not Applicable  Skilled Nursing Facility Complete Complete Complete  Some recent data might be hidden

## 2021-07-07 NOTE — Progress Notes (Signed)
Report called to [facility], RN at [facility] and included reason for admission, PMH, tests completed inpatient, recent vital signs, when pain medication was last administered, skin issues, diet/CBG assessment, and mobility. She has no questions at this time. Pt transferred from bed to stretcher using a 4-person pull method. Telemetry removed per orders. Pt was secured to the stretcher and transported with PTAR to the facility.

## 2021-07-07 NOTE — Discharge Instructions (Addendum)
Follow with Primary MD [name], MD in 7 days   Get CBC, CMP, 2 view Chest X ray -  checked next visit within 1 week by SNF MD   Activity: As tolerated with Full fall precautions use walker/cane & assistance as needed  Disposition SNF  Diet: Heart Healthy modified with feeding assistance and aspiration precautions.  All meals sitting up.  Preferably in the chair.  CBGs q. [place] monitor CBGs closely.  Special Instructions: If you have smoked or chewed Tobacco  in the last 2 yrs please stop smoking, stop any regular Alcohol  and or any Recreational drug use.  On your next visit with your primary care physician please Get Medicines reviewed and adjusted.  Please request your Prim.MD to go over all Hospital Tests and Procedure/Radiological results at the follow up, please get all Hospital records sent to your Prim MD by signing hospital release before you go home.  If you experience worsening of your admission symptoms, develop shortness of breath, life threatening emergency, suicidal or homicidal thoughts you must seek medical attention immediately by calling 911 or calling your MD immediately  if symptoms less severe.  You Must read complete instructions/literature along with all the possible adverse reactions/side effects for all the Medicines you take and that have been prescribed to you. Take any new Medicines after you have completely understood and accpet all the possible adverse reactions/side effects.

## 2021-07-07 NOTE — NC FL2 (Signed)
[place] LEVEL OF CARE SCREENING TOOL     IDENTIFICATION  Patient Name: Gregory [name] Birthdate: 11/14/1960 Sex: male Admission Date (Current Location): 06/26/2021  [facility] and [place] Number:  Herbalist and Address:  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   Asymptomatic bacteriuria 06/30/2021   Normocytic anemia 06/29/2021   Pain of amputation stump of right lower extremity ([place]) 03/15/2021   Melanoma of cheek ([place]) 10/15/2019   Acute respiratory failure with hypoxia ([place]) 07/11/2019   Acute respiratory disease due to COVID-19 virus 07/11/2019   Wound infection 06/22/2019   Abscess 06/22/2019   Stage 3b chronic kidney disease (CKD) ([place]) 05/23/2019   Tobacco use 05/23/2019   Sepsis ([place]) 05/23/2019   S/P AKA (above knee amputation) unilateral, right ([place]) 12/05/2018   Amputation stump infection ([place])    Lymphedema of left leg    Diabetic polyneuropathy associated with type 2 diabetes mellitus ([name])    Above knee amputation of right lower extremity ([place]) 10/22/2018   Dehiscence of amputation stump ([place])    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[place]) 08/12/2018   Candidiasis 08/12/2018   Necrosis of toe ([place]) 07/08/2018   Amputated toe of left foot ([place]) 06/18/2018   Streptococcal infection    Pressure injury, stage 3 ([place]) 06/06/2018   Cutaneous abscess of left foot    Moderate protein-calorie malnutrition ([place])    Septic arthritis ([place]) 06/03/2018   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 ([place]) 11/14/2017   Great toe amputation status, left 11/14/2017   Enlarged lymph node in neck 07/18/2017   Malignant melanoma of face ([place]) 04/16/2017   Osteomyelitis ([place]) 03/25/2017   Skin cancer 03/25/2017   Depression, recurrent ([place]) 11/05/2016   Diabetic ulcer of toe of left foot ([place]) 10/02/2016   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   Chronic pain 09/27/2016   Dyslipidemia associated with type 2 diabetes mellitus ([place]) 06/06/2016   GERD without esophagitis 05/11/2016   Hypertensive heart disease with congestive heart failure ([place]) 12/15/2015   Spinal stenosis in cervical region 12/15/2015   Type II diabetes mellitus with neurological manifestations ([place]) 12/15/2015   Chronic diastolic CHF (congestive heart failure) ([place])    Pulmonary hypertension (HCC)    HTN (hypertension)    HLD (hyperlipidemia)    Urinary retention    Diskitis    Foot drop, bilateral    Sepsis with acute renal failure without septic shock (HCC)    COPD (chronic obstructive pulmonary disease) ([place]) 10/03/2015   Acute osteomyelitis of left foot ([place]) 10/03/2015   MRSA bacteremia 08/13/2015   Chronic low back pain 08/13/2015   Chronic multifocal osteomyelitis, multiple sites ([place]) 08/13/2015   Community acquired pneumonia 08/13/2015    Orientation RESPIRATION BLADDER Height & Weight     Self, Time, Situation, Place  O2 (Nasal cannula 3L) Incontinent, External catheter Weight: 225 lb 5 oz (102.2 kg) Height:  6' (182.9 cm)  BEHAVIORAL SYMPTOMS/MOOD NEUROLOGICAL BOWEL NUTRITION STATUS  Incontinent Diet (see dc summary)  AMBULATORY STATUS COMMUNICATION OF NEEDS Skin   Limited Assist Verbally PU Stage and Appropriate Care (Stage II on coccyx with foam dressing; non-pressure wound on thigh)                       Personal Care Assistance Level of Assistance  Bathing, Feeding, Dressing Bathing Assistance: Limited assistance Feeding  assistance: Independent Dressing Assistance: Limited assistance     Functional Limitations Info  Sight Sight Info: Impaired        SPECIAL CARE FACTORS FREQUENCY                       Contractures Contractures Info: Not present    Additional Factors Info  Code Status, Allergies, Psychotropic, Isolation Precautions, Insulin Sliding Scale Code Status Info: Full Allergies Info: NKA Psychotropic Info: Wellbutrin; Cymbalta; Lyrica Insulin Sliding Scale Info: see dc summary Isolation Precautions Info: ESBL; MRSA     Current Medications (07/07/2021):  This is the current hospital active medication list Current Facility-Administered Medications  Medication Dose Route Frequency Provider Last Rate Last Admin   (feeding supplement) PROSource Plus liquid 30 mL  30 mL Oral Daily Simonne Maffucci B, MD   30 mL at 07/06/21 0949   0.9 %  sodium chloride infusion   Intravenous PRN Juanito Doom, MD   Stopped at 06/28/21 1202   acetaminophen (TYLENOL) tablet 650 mg  650 mg Oral Q6H PRN Simonne Maffucci B, MD   650 mg at 07/06/21 1541   albuterol (PROVENTIL) (2.5 MG/3ML) 0.083% nebulizer solution 2.5 mg  2.5 mg Nebulization Q2H PRN Juanito Doom, MD       ALPRAZolam Duanne Moron) tablet 0.25 mg  0.25 mg Oral BID PRN Simonne Maffucci B, MD   0.25 mg at 07/07/21 0226   arformoterol (BROVANA) nebulizer solution 15 mcg  15 mcg Nebulization BID Simonne Maffucci B, MD   15 mcg at 07/07/21 0838   benzocaine-Menthol (DERMOPLAST) 20-0.5 % topical spray 1 application  1 application Topical TID PRN Thurnell Lose, MD   1 application at 88/41/66 0226   budesonide (PULMICORT) nebulizer solution 0.5 mg  0.5 mg Nebulization BID Simonne Maffucci B, MD   0.5 mg at 07/07/21 0630   buPROPion (WELLBUTRIN XL) 24 hr tablet 300 mg  300 mg Oral Daily Simonne Maffucci B, MD   300 mg at 07/07/21 1601   calcium carbonate (TUMS - dosed in mg elemental calcium) chewable tablet 200 mg of elemental calcium  1 tablet  Oral TID PRN Jonetta Osgood, MD   200 mg of elemental calcium at 07/07/21 0516   chlorhexidine (PERIDEX) 0.12 % solution 15 mL  15 mL Mouth Rinse BID Simonne Maffucci B, MD   15 mL at 07/06/21 2228   Chlorhexidine Gluconate Cloth 2 % PADS 6 each  6 each Topical Daily Simonne Maffucci B, MD   6 each at 07/03/21 1003   docusate sodium (COLACE) capsule 200 mg  200 mg Oral BID Thurnell Lose, MD   200 mg at 07/07/21 0909   DULoxetine (CYMBALTA) DR capsule 60 mg  60 mg Oral Daily Simonne Maffucci B, MD   60 mg at 07/07/21 0909   eszopiclone (LUNESTA) tablet 3 mg  3 mg Oral QHS PRN Simonne Maffucci B, MD   3 mg at 07/06/21 2240   fentaNYL (DURAGESIC) 50 MCG/HR 1 patch  1 patch Transdermal Q72H Thurnell Lose, MD   1  patch at 07/06/21 1704   gabapentin (NEURONTIN) tablet 300 mg  300 mg Oral Q8H McQuaid, Nathaneil Canary B, MD   300 mg at 07/07/21 0504   guaiFENesin (MUCINEX) 12 hr tablet 1,200 mg  1,200 mg Oral BID Simonne Maffucci B, MD   1,200 mg at 07/07/21 0909   heparin injection 5,000 Units  5,000 Units Subcutaneous Q8H Simonne Maffucci B, MD   5,000 Units at 07/07/21 0505   insulin aspart (novoLOG) injection 0-5 Units  0-5 Units Subcutaneous QHS Thurnell Lose, MD   4 Units at 07/06/21 2224   insulin aspart (novoLOG) injection 0-9 Units  0-9 Units Subcutaneous TID WC Thurnell Lose, MD   2 Units at 07/07/21 0908   insulin glargine-yfgn (SEMGLEE) injection 10 Units  10 Units Subcutaneous Daily Juanito Doom, MD   10 Units at 07/07/21 4098   MEDLINE mouth rinse  15 mL Mouth Rinse q12n4p Simonne Maffucci B, MD   15 mL at 07/05/21 1600   multivitamin (RENA-VIT) tablet 1 tablet  1 tablet Oral QHS Simonne Maffucci B, MD   1 tablet at 07/06/21 2227   naloxegol oxalate (MOVANTIK) tablet 12.5 mg  12.5 mg Oral Daily Lala Lund K, MD   12.5 mg at 07/07/21 0909   naloxone (NARCAN) injection 0.4 mg  0.4 mg Intravenous PRN Thurnell Lose, MD       nicotine (NICODERM CQ - dosed in mg/24 hours)  patch 21 mg  21 mg Transdermal Daily Simonne Maffucci B, MD   21 mg at 07/07/21 0911   ondansetron (ZOFRAN) injection 4 mg  4 mg Intravenous Q6H PRN Thurnell Lose, MD   4 mg at 07/03/21 1650   ondansetron (ZOFRAN) tablet 4 mg  4 mg Oral Q8H PRN Thurnell Lose, MD   4 mg at 07/06/21 1836   oxyCODONE (Oxy IR/ROXICODONE) immediate release tablet 30 mg  30 mg Oral Q6H PRN Thurnell Lose, MD   30 mg at 07/07/21 0504   pantoprazole (PROTONIX) EC tablet 40 mg  40 mg Oral Daily Simonne Maffucci B, MD   40 mg at 07/07/21 0909   polyethylene glycol (MIRALAX / GLYCOLAX) packet 17 g  17 g Oral Daily Thurnell Lose, MD   17 g at 07/03/21 1242   pregabalin (LYRICA) capsule 150 mg  150 mg Oral BID Simonne Maffucci B, MD   150 mg at 07/07/21 1191   prochlorperazine (COMPAZINE) injection 10 mg  10 mg Intravenous Q6H PRN Thurnell Lose, MD   10 mg at 07/05/21 0559   prochlorperazine (COMPAZINE) injection 10 mg  10 mg Intravenous Once Shalhoub, Sherryll Burger, MD       rosuvastatin (CRESTOR) tablet 10 mg  10 mg Oral QHS Jonetta Osgood, MD   10 mg at 07/06/21 2226   tamsulosin (FLOMAX) capsule 0.4 mg  0.4 mg Oral QPM Simonne Maffucci B, MD   0.4 mg at 07/06/21 1703   tiZANidine (ZANAFLEX) tablet 4 mg  4 mg Oral Q6H PRN Simonne Maffucci B, MD   4 mg at 07/06/21 2240   torsemide (DEMADEX) tablet 40 mg  40 mg Oral Daily Thurnell Lose, MD   40 mg at 07/07/21 0910   umeclidinium bromide (INCRUSE ELLIPTA) 62.5 MCG/ACT 1 puff  1 puff Inhalation Daily Jonetta Osgood, MD   1 puff at 07/07/21 4782     Discharge Medications: Please see discharge summary for a list of discharge medications.  Relevant Imaging Results:  Relevant Lab  Results:   Additional Information SS#: [id number]  Bethany Bolivar, [place]

## 2021-07-13 ENCOUNTER — Ambulatory Visit: Payer: Medicaid Other | Admitting: Vascular Surgery

## 2021-07-13 ENCOUNTER — Encounter (HOSPITAL_COMMUNITY): Payer: Medicaid Other

## 2021-07-26 ENCOUNTER — Ambulatory Visit: Payer: Medicaid Other | Admitting: Vascular Surgery

## 2021-07-26 ENCOUNTER — Inpatient Hospital Stay (HOSPITAL_COMMUNITY): Admission: RE | Admit: 2021-07-26 | Payer: Medicaid Other | Source: Ambulatory Visit

## 2021-09-05 ENCOUNTER — Emergency Department (HOSPITAL_COMMUNITY): Payer: Medicaid Other

## 2021-09-05 ENCOUNTER — Encounter (HOSPITAL_COMMUNITY): Payer: Self-pay | Admitting: Internal Medicine

## 2021-09-05 ENCOUNTER — Inpatient Hospital Stay (HOSPITAL_COMMUNITY)
Admission: EM | Admit: 2021-09-05 | Discharge: 2021-09-08 | DRG: 193 | Disposition: A | Discharge status: Skilled Nursing Facility | Payer: Medicaid Other | Source: Skilled Nursing Facility | Attending: Internal Medicine | Admitting: Internal Medicine

## 2021-09-05 ENCOUNTER — Other Ambulatory Visit: Payer: Self-pay

## 2021-09-05 DIAGNOSIS — Z8042 Family history of malignant neoplasm of prostate: Secondary | ICD-10-CM

## 2021-09-05 DIAGNOSIS — Z720 Tobacco use: Secondary | ICD-10-CM | POA: Diagnosis present

## 2021-09-05 DIAGNOSIS — K219 Gastro-esophageal reflux disease without esophagitis: Secondary | ICD-10-CM | POA: Diagnosis present

## 2021-09-05 DIAGNOSIS — Z765 Malingerer [conscious simulation]: Secondary | ICD-10-CM | POA: Diagnosis not present

## 2021-09-05 DIAGNOSIS — J449 Chronic obstructive pulmonary disease, unspecified: Secondary | ICD-10-CM | POA: Diagnosis present

## 2021-09-05 DIAGNOSIS — E1165 Type 2 diabetes mellitus with hyperglycemia: Secondary | ICD-10-CM | POA: Diagnosis present

## 2021-09-05 DIAGNOSIS — D631 Anemia in chronic kidney disease: Secondary | ICD-10-CM | POA: Diagnosis present

## 2021-09-05 DIAGNOSIS — E871 Hypo-osmolality and hyponatremia: Secondary | ICD-10-CM

## 2021-09-05 DIAGNOSIS — L89316 Pressure-induced deep tissue damage of right buttock: Secondary | ICD-10-CM | POA: Diagnosis present

## 2021-09-05 DIAGNOSIS — J9601 Acute respiratory failure with hypoxia: Secondary | ICD-10-CM | POA: Diagnosis present

## 2021-09-05 DIAGNOSIS — F32A Depression, unspecified: Secondary | ICD-10-CM | POA: Diagnosis present

## 2021-09-05 DIAGNOSIS — G894 Chronic pain syndrome: Secondary | ICD-10-CM | POA: Diagnosis present

## 2021-09-05 DIAGNOSIS — D649 Anemia, unspecified: Secondary | ICD-10-CM | POA: Diagnosis not present

## 2021-09-05 DIAGNOSIS — N400 Enlarged prostate without lower urinary tract symptoms: Secondary | ICD-10-CM

## 2021-09-05 DIAGNOSIS — E1169 Type 2 diabetes mellitus with other specified complication: Secondary | ICD-10-CM | POA: Diagnosis present

## 2021-09-05 DIAGNOSIS — I10 Essential (primary) hypertension: Secondary | ICD-10-CM | POA: Diagnosis present

## 2021-09-05 DIAGNOSIS — J189 Pneumonia, unspecified organism: Principal | ICD-10-CM | POA: Diagnosis present

## 2021-09-05 DIAGNOSIS — J440 Chronic obstructive pulmonary disease with acute lower respiratory infection: Secondary | ICD-10-CM | POA: Diagnosis present

## 2021-09-05 DIAGNOSIS — F419 Anxiety disorder, unspecified: Secondary | ICD-10-CM

## 2021-09-05 DIAGNOSIS — Z981 Arthrodesis status: Secondary | ICD-10-CM

## 2021-09-05 DIAGNOSIS — L89151 Pressure ulcer of sacral region, stage 1: Secondary | ICD-10-CM | POA: Diagnosis present

## 2021-09-05 DIAGNOSIS — Z85820 Personal history of malignant melanoma of skin: Secondary | ICD-10-CM

## 2021-09-05 DIAGNOSIS — Z833 Family history of diabetes mellitus: Secondary | ICD-10-CM

## 2021-09-05 DIAGNOSIS — E785 Hyperlipidemia, unspecified: Secondary | ICD-10-CM | POA: Diagnosis present

## 2021-09-05 DIAGNOSIS — Z7984 Long term (current) use of oral hypoglycemic drugs: Secondary | ICD-10-CM

## 2021-09-05 DIAGNOSIS — Z20822 Contact with and (suspected) exposure to covid-19: Secondary | ICD-10-CM | POA: Diagnosis present

## 2021-09-05 DIAGNOSIS — I5032 Chronic diastolic (congestive) heart failure: Secondary | ICD-10-CM | POA: Diagnosis present

## 2021-09-05 DIAGNOSIS — I130 Hypertensive heart and chronic kidney disease with heart failure and stage 1 through stage 4 chronic kidney disease, or unspecified chronic kidney disease: Secondary | ICD-10-CM | POA: Diagnosis present

## 2021-09-05 DIAGNOSIS — I890 Lymphedema, not elsewhere classified: Secondary | ICD-10-CM | POA: Diagnosis present

## 2021-09-05 DIAGNOSIS — E1122 Type 2 diabetes mellitus with diabetic chronic kidney disease: Secondary | ICD-10-CM | POA: Diagnosis present

## 2021-09-05 DIAGNOSIS — G8929 Other chronic pain: Secondary | ICD-10-CM | POA: Diagnosis present

## 2021-09-05 DIAGNOSIS — Z79899 Other long term (current) drug therapy: Secondary | ICD-10-CM

## 2021-09-05 DIAGNOSIS — Z7951 Long term (current) use of inhaled steroids: Secondary | ICD-10-CM

## 2021-09-05 DIAGNOSIS — Z8249 Family history of ischemic heart disease and other diseases of the circulatory system: Secondary | ICD-10-CM

## 2021-09-05 DIAGNOSIS — F39 Unspecified mood [affective] disorder: Secondary | ICD-10-CM

## 2021-09-05 DIAGNOSIS — E119 Type 2 diabetes mellitus without complications: Secondary | ICD-10-CM | POA: Diagnosis present

## 2021-09-05 DIAGNOSIS — E861 Hypovolemia: Secondary | ICD-10-CM | POA: Diagnosis present

## 2021-09-05 DIAGNOSIS — Z794 Long term (current) use of insulin: Secondary | ICD-10-CM

## 2021-09-05 DIAGNOSIS — E1149 Type 2 diabetes mellitus with other diabetic neurological complication: Secondary | ICD-10-CM | POA: Diagnosis present

## 2021-09-05 DIAGNOSIS — F17210 Nicotine dependence, cigarettes, uncomplicated: Secondary | ICD-10-CM | POA: Diagnosis present

## 2021-09-05 DIAGNOSIS — N1832 Chronic kidney disease, stage 3b: Secondary | ICD-10-CM | POA: Diagnosis present

## 2021-09-05 DIAGNOSIS — Z89611 Acquired absence of right leg above knee: Secondary | ICD-10-CM

## 2021-09-05 DIAGNOSIS — N183 Chronic kidney disease, stage 3 unspecified: Secondary | ICD-10-CM | POA: Diagnosis present

## 2021-09-05 LAB — BRAIN NATRIURETIC PEPTIDE: B Natriuretic Peptide: 42.7 pg/mL (ref 0.0–100.0)

## 2021-09-05 LAB — GLUCOSE, CAPILLARY
Glucose-Capillary: 159 mg/dL — ABNORMAL HIGH (ref 70–99)
Glucose-Capillary: 182 mg/dL — ABNORMAL HIGH (ref 70–99)
Glucose-Capillary: 316 mg/dL — ABNORMAL HIGH (ref 70–99)

## 2021-09-05 LAB — RESPIRATORY PANEL BY PCR

## 2021-09-05 LAB — COMPREHENSIVE METABOLIC PANEL
ALT: 12 U/L (ref 0–44)
AST: 14 U/L — ABNORMAL LOW (ref 15–41)
Albumin: 3.5 g/dL (ref 3.5–5.0)
Alkaline Phosphatase: 81 U/L (ref 38–126)
Anion gap: 7 (ref 5–15)
BUN: 34 mg/dL — ABNORMAL HIGH (ref 8–23)
CO2: 23 mmol/L (ref 22–32)
Calcium: 8.7 mg/dL — ABNORMAL LOW (ref 8.9–10.3)
Chloride: 103 mmol/L (ref 98–111)
Creatinine, Ser: 1.88 mg/dL — ABNORMAL HIGH (ref 0.61–1.24)
GFR, Estimated: 40 mL/min — ABNORMAL LOW (ref >60)
Glucose, Bld: 268 mg/dL — ABNORMAL HIGH (ref 70–99)
Potassium: 5.1 mmol/L (ref 3.5–5.1)
Sodium: 133 mmol/L — ABNORMAL LOW (ref 135–145)
Total Bilirubin: 0.3 mg/dL (ref 0.3–1.2)
Total Protein: 7.4 g/dL (ref 6.5–8.1)

## 2021-09-05 LAB — CBC WITH DIFFERENTIAL/PLATELET
Abs Immature Granulocytes: 0.09 10*3/uL — ABNORMAL HIGH (ref 0.00–0.07)
Basophils Absolute: 0.1 10*3/uL (ref 0.0–0.1)
Basophils Relative: 0 %
Eosinophils Absolute: 0.2 10*3/uL (ref 0.0–0.5)
Eosinophils Relative: 1 %
HCT: 32.7 % — ABNORMAL LOW (ref 39.0–52.0)
Hemoglobin: 10.2 g/dL — ABNORMAL LOW (ref 13.0–17.0)
Immature Granulocytes: 1 %
Lymphocytes Relative: 5 %
Lymphs Abs: 0.9 10*3/uL (ref 0.7–4.0)
MCH: 27.6 pg (ref 26.0–34.0)
MCHC: 31.2 g/dL (ref 30.0–36.0)
MCV: 88.4 fL (ref 80.0–100.0)
Monocytes Absolute: 0.6 10*3/uL (ref 0.1–1.0)
Monocytes Relative: 3 %
Neutro Abs: 15.6 10*3/uL — ABNORMAL HIGH (ref 1.7–7.7)
Neutrophils Relative %: 90 %
Platelets: 204 10*3/uL (ref 150–400)
RBC: 3.7 MIL/uL — ABNORMAL LOW (ref 4.22–5.81)
RDW: 16 % — ABNORMAL HIGH (ref 11.5–15.5)
WBC: 17.5 10*3/uL — ABNORMAL HIGH (ref 4.0–10.5)
nRBC: 0 % (ref 0.0–0.2)

## 2021-09-05 LAB — LACTIC ACID, PLASMA
Lactic Acid, Venous: 1.8 mmol/L (ref 0.5–1.9)
Lactic Acid, Venous: 0.9 mmol/L (ref 0.5–1.9)

## 2021-09-05 LAB — CBC
HCT: 34.6 % — ABNORMAL LOW (ref 39.0–52.0)
Hemoglobin: 10.5 g/dL — ABNORMAL LOW (ref 13.0–17.0)
MCH: 27.1 pg (ref 26.0–34.0)
MCHC: 30.3 g/dL (ref 30.0–36.0)
MCV: 89.4 fL (ref 80.0–100.0)
Platelets: 217 10*3/uL (ref 150–400)
RBC: 3.87 MIL/uL — ABNORMAL LOW (ref 4.22–5.81)
RDW: 15.9 % — ABNORMAL HIGH (ref 11.5–15.5)
WBC: 17 10*3/uL — ABNORMAL HIGH (ref 4.0–10.5)
nRBC: 0 % (ref 0.0–0.2)

## 2021-09-05 LAB — RESP PANEL BY RT-PCR (FLU A&B, COVID) ARPGX2
Influenza A by PCR: NEGATIVE
Influenza B by PCR: NEGATIVE
SARS Coronavirus 2 by RT PCR: NEGATIVE

## 2021-09-05 MED ORDER — HYDROXYZINE HCL 25 MG PO TABS
25.0000 mg | ORAL_TABLET | Freq: Four times a day (QID) | ORAL | Status: DC | PRN
  Administered 2021-09-05 – 2021-09-08 (×2): 25 mg via ORAL
  Filled 2021-09-05 (×2): qty 1

## 2021-09-05 MED ORDER — ACETAMINOPHEN 650 MG RE SUPP: 650.0000 mg | Freq: Four times a day (QID) | RECTAL | Status: DC | PRN

## 2021-09-05 MED ORDER — DULOXETINE HCL 60 MG PO CPEP
60.0000 mg | ORAL_CAPSULE | Freq: Every day | ORAL | Status: DC
  Administered 2021-09-05 – 2021-09-08 (×4): 60 mg via ORAL
  Filled 2021-09-05 (×4): qty 1

## 2021-09-05 MED ORDER — MAGNESIUM HYDROXIDE 400 MG/5ML PO SUSP: 30.0000 mL | Freq: Every day | ORAL | Status: DC | PRN

## 2021-09-05 MED ORDER — TIOTROPIUM BROMIDE MONOHYDRATE 1.25 MCG/ACT IN AERS: 2.0000 | INHALATION_SPRAY | Freq: Every day | RESPIRATORY_TRACT | Status: DC

## 2021-09-05 MED ORDER — INSULIN ASPART 100 UNIT/ML IJ SOLN
0.0000 [IU] | Freq: Three times a day (TID) | INTRAMUSCULAR | Status: DC
  Administered 2021-09-05 (×2): 3 [IU] via SUBCUTANEOUS
  Filled 2021-09-05: qty 0.15

## 2021-09-05 MED ORDER — ONDANSETRON HCL 4 MG/2ML IJ SOLN
4.0000 mg | Freq: Four times a day (QID) | INTRAMUSCULAR | Status: DC | PRN
  Administered 2021-09-08: 4 mg via INTRAVENOUS
  Filled 2021-09-05: qty 2

## 2021-09-05 MED ORDER — SENNOSIDES-DOCUSATE SODIUM 8.6-50 MG PO TABS
1.0000 | ORAL_TABLET | Freq: Every day | ORAL | Status: DC
  Administered 2021-09-05 – 2021-09-07 (×3): 1 via ORAL
  Filled 2021-09-05 (×3): qty 1

## 2021-09-05 MED ORDER — GUAIFENESIN ER 600 MG PO TB12
600.0000 mg | ORAL_TABLET | Freq: Two times a day (BID) | ORAL | Status: DC
  Administered 2021-09-05 – 2021-09-08 (×6): 600 mg via ORAL
  Filled 2021-09-05 (×6): qty 1

## 2021-09-05 MED ORDER — LORATADINE 10 MG PO TABS
10.0000 mg | ORAL_TABLET | Freq: Every day | ORAL | Status: DC
  Administered 2021-09-05 – 2021-09-08 (×4): 10 mg via ORAL
  Filled 2021-09-05 (×4): qty 1

## 2021-09-05 MED ORDER — VANCOMYCIN HCL 2000 MG/400ML IV SOLN
2000.0000 mg | Freq: Once | INTRAVENOUS | Status: AC
  Administered 2021-09-05: 2000 mg via INTRAVENOUS
  Filled 2021-09-05: qty 400

## 2021-09-05 MED ORDER — LACTATED RINGERS IV SOLN: INTRAVENOUS | Status: DC

## 2021-09-05 MED ORDER — NICOTINE 21 MG/24HR TD PT24
21.0000 mg | MEDICATED_PATCH | Freq: Every day | TRANSDERMAL | Status: DC
  Administered 2021-09-05 – 2021-09-08 (×4): 21 mg via TRANSDERMAL
  Filled 2021-09-05 (×4): qty 1

## 2021-09-05 MED ORDER — FUROSEMIDE 20 MG PO TABS
20.0000 mg | ORAL_TABLET | ORAL | Status: DC
  Administered 2021-09-05: 20 mg via ORAL
  Filled 2021-09-05 (×2): qty 1

## 2021-09-05 MED ORDER — FERROUS SULFATE 325 (65 FE) MG PO TABS
325.0000 mg | ORAL_TABLET | Freq: Every day | ORAL | Status: DC
  Administered 2021-09-06 – 2021-09-08 (×3): 325 mg via ORAL
  Filled 2021-09-05 (×3): qty 1

## 2021-09-05 MED ORDER — METOPROLOL TARTRATE 25 MG PO TABS
12.5000 mg | ORAL_TABLET | Freq: Two times a day (BID) | ORAL | Status: DC
  Administered 2021-09-05 – 2021-09-08 (×7): 12.5 mg via ORAL
  Filled 2021-09-05 (×7): qty 1

## 2021-09-05 MED ORDER — INSULIN ASPART 100 UNIT/ML IJ SOLN
0.0000 [IU] | Freq: Every day | INTRAMUSCULAR | Status: DC
  Administered 2021-09-05: 4 [IU] via SUBCUTANEOUS
  Filled 2021-09-05: qty 0.05

## 2021-09-05 MED ORDER — HEPARIN SODIUM (PORCINE) 5000 UNIT/ML IJ SOLN
5000.0000 [IU] | Freq: Three times a day (TID) | INTRAMUSCULAR | Status: DC
  Administered 2021-09-05 – 2021-09-08 (×10): 5000 [IU] via SUBCUTANEOUS
  Filled 2021-09-05 (×10): qty 1

## 2021-09-05 MED ORDER — GABAPENTIN 300 MG PO CAPS
300.0000 mg | ORAL_CAPSULE | Freq: Three times a day (TID) | ORAL | Status: DC
  Administered 2021-09-05 – 2021-09-08 (×10): 300 mg via ORAL
  Filled 2021-09-05 (×9): qty 1

## 2021-09-05 MED ORDER — ROPINIROLE HCL 1 MG PO TABS
0.5000 mg | ORAL_TABLET | Freq: Three times a day (TID) | ORAL | Status: DC
  Administered 2021-09-05 – 2021-09-08 (×9): 0.5 mg via ORAL
  Filled 2021-09-05 (×9): qty 1

## 2021-09-05 MED ORDER — OXYBUTYNIN CHLORIDE ER 10 MG PO TB24
10.0000 mg | ORAL_TABLET | Freq: Every day | ORAL | Status: DC
Start: 2021-09-05 — End: 2021-09-08
  Administered 2021-09-05 – 2021-09-08 (×4): 10 mg via ORAL
  Filled 2021-09-05 (×4): qty 1

## 2021-09-05 MED ORDER — OXYCODONE HCL 5 MG PO TABS
30.0000 mg | ORAL_TABLET | Freq: Three times a day (TID) | ORAL | Status: DC | PRN
  Administered 2021-09-05 – 2021-09-06 (×4): 30 mg via ORAL
  Filled 2021-09-05 (×4): qty 6

## 2021-09-05 MED ORDER — UMECLIDINIUM BROMIDE 62.5 MCG/ACT IN AEPB
1.0000 | INHALATION_SPRAY | Freq: Every day | RESPIRATORY_TRACT | Status: DC
  Administered 2021-09-06 – 2021-09-08 (×3): 1 via RESPIRATORY_TRACT
  Filled 2021-09-05: qty 7

## 2021-09-05 MED ORDER — ONDANSETRON HCL 4 MG PO TABS
4.0000 mg | ORAL_TABLET | Freq: Four times a day (QID) | ORAL | Status: DC | PRN
  Administered 2021-09-06: 4 mg via ORAL
  Filled 2021-09-05: qty 1

## 2021-09-05 MED ORDER — SODIUM CHLORIDE 0.9 % IV SOLN
2.0000 g | Freq: Two times a day (BID) | INTRAVENOUS | Status: DC
  Administered 2021-09-05 – 2021-09-07 (×4): 2 g via INTRAVENOUS
  Filled 2021-09-05 (×4): qty 2

## 2021-09-05 MED ORDER — INSULIN GLARGINE-YFGN 100 UNIT/ML ~~LOC~~ SOLN
12.0000 [IU] | Freq: Every day | SUBCUTANEOUS | Status: DC
Start: 2021-09-05 — End: 2021-09-06
  Administered 2021-09-05: 12 [IU] via SUBCUTANEOUS
  Filled 2021-09-05: qty 0.12

## 2021-09-05 MED ORDER — VANCOMYCIN HCL IN DEXTROSE 1-5 GM/200ML-% IV SOLN
1000.0000 mg | INTRAVENOUS | Status: DC
  Administered 2021-09-06 – 2021-09-07 (×2): 1000 mg via INTRAVENOUS
  Filled 2021-09-05 (×2): qty 200

## 2021-09-05 MED ORDER — AMLODIPINE BESYLATE 10 MG PO TABS
10.0000 mg | ORAL_TABLET | Freq: Every day | ORAL | Status: DC
  Administered 2021-09-05 – 2021-09-08 (×4): 10 mg via ORAL
  Filled 2021-09-05 (×4): qty 1

## 2021-09-05 MED ORDER — ROSUVASTATIN CALCIUM 20 MG PO TABS
10.0000 mg | ORAL_TABLET | Freq: Every day | ORAL | Status: DC
  Administered 2021-09-05 – 2021-09-07 (×3): 10 mg via ORAL
  Filled 2021-09-05 (×3): qty 1

## 2021-09-05 MED ORDER — PREGABALIN 75 MG PO CAPS
150.0000 mg | ORAL_CAPSULE | Freq: Two times a day (BID) | ORAL | Status: DC
  Administered 2021-09-05 – 2021-09-08 (×7): 150 mg via ORAL
  Filled 2021-09-05 (×8): qty 2

## 2021-09-05 MED ORDER — ACETAMINOPHEN 325 MG PO TABS
650.0000 mg | ORAL_TABLET | Freq: Four times a day (QID) | ORAL | Status: DC | PRN
  Administered 2021-09-06 – 2021-09-08 (×3): 650 mg via ORAL
  Filled 2021-09-05 (×4): qty 2

## 2021-09-05 MED ORDER — BUPROPION HCL ER (XL) 300 MG PO TB24
300.0000 mg | ORAL_TABLET | Freq: Every day | ORAL | Status: DC
  Administered 2021-09-05 – 2021-09-08 (×4): 300 mg via ORAL
  Filled 2021-09-05 (×4): qty 1

## 2021-09-05 MED ORDER — TRIAMCINOLONE ACETONIDE 0.1 % EX CREA
1.0000 "application " | TOPICAL_CREAM | Freq: Two times a day (BID) | CUTANEOUS | Status: DC
  Administered 2021-09-06 – 2021-09-07 (×2): 1 via TOPICAL
  Filled 2021-09-05: qty 15

## 2021-09-05 MED ORDER — IPRATROPIUM-ALBUTEROL 0.5-2.5 (3) MG/3ML IN SOLN: 3.0000 mL | Freq: Four times a day (QID) | RESPIRATORY_TRACT | Status: DC | PRN

## 2021-09-05 MED ORDER — SODIUM CHLORIDE 0.9 % IV SOLN
2.0000 g | Freq: Once | INTRAVENOUS | Status: AC
  Administered 2021-09-05: 2 g via INTRAVENOUS
  Filled 2021-09-05: qty 2

## 2021-09-05 MED ORDER — VANCOMYCIN HCL IN DEXTROSE 1-5 GM/200ML-% IV SOLN: 1000.0000 mg | Freq: Once | INTRAVENOUS | Status: DC

## 2021-09-05 MED ORDER — POLYETHYLENE GLYCOL 3350 17 G PO PACK
17.0000 g | PACK | Freq: Every day | ORAL | Status: DC
  Administered 2021-09-08: 17 g via ORAL
  Filled 2021-09-05 (×3): qty 1

## 2021-09-05 MED ORDER — TIZANIDINE HCL 4 MG PO TABS
4.0000 mg | ORAL_TABLET | Freq: Four times a day (QID) | ORAL | Status: DC | PRN
  Administered 2021-09-05 – 2021-09-08 (×8): 4 mg via ORAL
  Filled 2021-09-05 (×8): qty 1

## 2021-09-05 MED ORDER — TAMSULOSIN HCL 0.4 MG PO CAPS
0.4000 mg | ORAL_CAPSULE | Freq: Every evening | ORAL | Status: DC
  Administered 2021-09-05 – 2021-09-07 (×3): 0.4 mg via ORAL
  Filled 2021-09-05 (×3): qty 1

## 2021-09-05 MED ORDER — CYANOCOBALAMIN 500 MCG PO TABS
1000.0000 ug | ORAL_TABLET | Freq: Every day | ORAL | Status: DC
  Administered 2021-09-05 – 2021-09-08 (×4): 1000 ug via ORAL
  Filled 2021-09-05 (×4): qty 2

## 2021-09-05 MED ORDER — DOCUSATE SODIUM 100 MG PO CAPS
100.0000 mg | ORAL_CAPSULE | Freq: Two times a day (BID) | ORAL | Status: DC
  Administered 2021-09-05 – 2021-09-08 (×7): 100 mg via ORAL
  Filled 2021-09-05 (×7): qty 1

## 2021-09-05 NOTE — Progress Notes (Signed)
Pharmacy Antibiotic Note ? ?Gregory [name] is a 61 y.o. male admitted on 09/05/2021 with with a history of COPD, pretension, ongoing smoking who presents with shortness of breath..  Pharmacy has been consulted to dose cefepime and vancomycin for pna ? ?Plan: ?Vancomycin 2gm IV x 1 then 1gm q24h (AUC 471.4, Scr 1.88) ?Cefepime 2gm IV q12h ?Follow renal function and clinical course ? ?  ? ?Temp (24hrs), Avg:99.5 ?F (37.5 ?C), Min:99.5 ?F (37.5 ?C), Max:99.5 ?F (37.5 ?C) ? ?Recent Labs  ?Lab 09/05/21 ?8757  ?WBC 17.5*  ?CREATININE 1.88*  ?LATICACIDVEN 1.8  ?  ?CrCl cannot be calculated (Unknown ideal weight.).   ? ?No Known Allergies ? ? ?Thank you for allowing pharmacy to be a part of this patient?s care. ? ?Dolly Rias RPh ?09/05/2021, 6:42 AM ? ?

## 2021-09-05 NOTE — ED Provider Notes (Signed)
?[facility] DEPT ?Provider Note ? ? ?CSN: [id number] ?Arrival date & time: 09/05/21  7893 ? ?  ? ?History ? ?Chief Complaint  ?Patient presents with  ? Shortness of Breath  ? ? ?Gregory [name] is a 61 y.o. male. ? ?HPI ? ?  ? ?This is a 61 year old male with a history of COPD, pretension, ongoing smoking who presents with shortness of breath.  Patient reports "I just could not catch my breath."  States that symptoms have progressed over the last 1 to 2 days.  Reports cough.  States that he had a fever at his living facility.  Has not had any nausea or vomiting.  He is not normally on oxygen.  Recent history of pneumonia in January.  Reportedly febrile by EMS.  He was given Tylenol by EMS.  Denies chest pain.  At baseline has swelling of the left lower extremity which she states is not new.  AKA of the right lower extremity. ? ?Home Medications ?Prior to Admission medications   ?Medication Sig Start Date End Date Taking? Authorizing Provider  ?acetaminophen (TYLENOL) 325 MG tablet Take 650 mg by mouth every 4 (four) hours as needed (general discomfort).    Yes [provider]  ?amLODipine (NORVASC) 10 MG tablet Take 10 mg by mouth daily. for HTN   Yes [provider]  ?buPROPion (WELLBUTRIN XL) 300 MG 24 hr tablet Take 300 mg by mouth daily.   Yes [provider]  ?cetirizine (ZYRTEC) 10 MG tablet Take 10 mg by mouth daily.   Yes [provider]  ?diclofenac Sodium (VOLTAREN) 1 % GEL Apply 4 g topically every 6 (six) hours as needed (right leg pain).   Yes [provider]  ?docusate sodium (COLACE) 100 MG capsule Take 1 capsule (100 mg total) by mouth 2 (two) times daily. 05/31/19  Yes Amin, Jeanella Flattery, MD  ?DULoxetine (CYMBALTA) 60 MG capsule Take 60 mg by mouth daily.   Yes [provider]  ?Eszopiclone 3 MG TABS Take 3 mg by mouth at bedtime. 09/29/20  Yes [provider]  ?ferrous sulfate 325 (65 FE) MG tablet Take 1 tablet  (325 mg total) by mouth daily with breakfast. 11/23/18  Yes Matilde Haymaker, MD  ?furosemide (LASIX) 20 MG tablet Take 20 mg by mouth See admin instructions. Give 48m by mouth every Tuesday, Thursday, Saturday, Sunday for leg swelling   Yes [provider]  ?gabapentin (NEURONTIN) 300 MG capsule Take 300 mg by mouth 3 (three) times daily.   Yes [provider]  ?glipiZIDE (GLUCOTROL) 10 MG tablet Take 10 mg by mouth daily before breakfast.   Yes [provider]  ?hydrOXYzine (ATARAX) 25 MG tablet Take 25 mg by mouth every 6 (six) hours as needed for itching.   Yes [provider]  ?insulin glargine (LANTUS) 100 UNIT/ML injection Inject 12 Units into the skin at bedtime.   Yes [provider]  ?insulin lispro (HUMALOG) 100 UNIT/ML KwikPen Inject 0-10 Units into the skin See admin instructions. Per sliding scale before meals and at bedtime. ?<70 or >400 = call md ?151-200 = 2 units ?201-50 = 4 units ?251-300 = 6 units ?301-350 =  8 units ?351-400 = 10 units   Yes [provider]  ?magnesium oxide (MAG-OX) 400 MG tablet Take 400 mg by mouth 2 (two) times daily.   Yes [provider]  ?Menthol, Topical Analgesic, (BIOFREEZE) 4 % GEL Apply 1 application topically every 8 (eight) hours  as needed (pain to right leg/stump).   Yes [provider]  ?metoprolol tartrate (LOPRESSOR) 25 MG tablet Take 0.5 tablets (12.5 mg total) by mouth 2 (two) times daily. 07/18/19 09/06/22 Yes Elodia Florence., MD  ?omeprazole (PRILOSEC) 20 MG capsule Take 20 mg by mouth at bedtime.   Yes [provider]  ?ondansetron (ZOFRAN) 4 MG tablet Take 4 mg by mouth every 8 (eight) hours as needed for nausea.   Yes [provider]  ?oxybutynin (DITROPAN-XL) 10 MG 24 hr tablet Take 10 mg by mouth daily.   Yes [provider]  ?oxycodone (OXY-IR) 5 MG capsule Take 1 capsule (5 mg total) by mouth every 8 (eight) hours as needed for pain. 07/07/21  Yes Thurnell Lose, MD  ?oxycodone (ROXICODONE) 30 MG immediate release tablet Take 1 tablet (30 mg total) by mouth every 8 (eight) hours. 07/07/21  Yes Thurnell Lose, MD  ?phenazopyridine (PYRIDIUM) 200 MG tablet Take 200 mg by mouth every 8 (eight) hours as needed (urinary pain).   Yes [provider]  ?polyethylene glycol (MIRALAX / GLYCOLAX) 17 g packet Take 17 g by mouth daily.   Yes [provider]  ?pregabalin (LYRICA) 150 MG capsule Take 1 capsule (150 mg total) by mouth 2 (two) times daily. 07/07/21  Yes Thurnell Lose, MD  ?rOPINIRole (REQUIP) 0.25 MG tablet Take 0.5 mg by mouth 3 (three) times daily.   Yes [provider]  ?rosuvastatin (CRESTOR) 10 MG tablet Take 10 mg by mouth at bedtime.   Yes [provider]  ?senna-docusate (SENOKOT-S) 8.6-50 MG tablet Take 1 tablet by mouth at bedtime.   Yes [provider]  ?tamsulosin (FLOMAX) 0.4 MG CAPS capsule Take 0.4 mg by mouth every evening.  11/26/13  Yes [provider]  ?Tiotropium Bromide Monohydrate 1.25 MCG/ACT AERS Inhale 2 puffs into the lungs daily.   Yes [provider]  ?tiZANidine (ZANAFLEX) 4 MG tablet Take 4 mg by mouth every 6 (six) hours as needed for muscle spasms.   Yes [provider]  ?triamcinolone cream (KENALOG) 0.1 % Apply 1 application topically 2 (two) times daily. Left lower leg   Yes [provider]  ?vitamin B-12 (CYANOCOBALAMIN) 500 MCG tablet Take 1,000 mcg by mouth daily.    Yes [provider]  ?albuterol (PROVENTIL HFA;VENTOLIN HFA) 108 (90 Base) MCG/ACT inhaler Inhale 2 puffs into the lungs every 6 (six) hours as needed for wheezing or shortness of breath. ?Patient not taking: Reported on 09/05/2021    [provider]  ?alum & mag hydroxide-simeth (MAALOX PLUS) 400-400-40 MG/5ML suspension Take 10 mLs by mouth every 4 (four) hours as needed for indigestion (heartburn,nausea,gas). ?Patient not taking: Reported on 09/05/2021     [provider]  ?Glucagon HCl, rDNA, (GLUCAGEN IJ) Inject 1 mg into the muscle as needed (hyperglycemia). ?Patient not taking: Reported on 09/05/2021    [provider]  ?guaiFENesin (ROBAFEN PO) Take 10 mLs by mouth every 4 (four) hours as needed (cough). ?Patient not taking: Reported on 09/05/2021    [provider]  ?hydrocortisone cream 1 % Apply 1 application topically as needed (itching on face). ?Patient not taking: Reported on 09/05/2021    [provider]  ?Lidocaine 4 % PTCH Apply 1 patch topically daily. ?Patient not taking: Reported on 09/05/2021    [provider]  ?magnesium hydroxide (MILK OF MAGNESIA) 400 MG/5ML suspension Take 30 mLs by mouth at bedtime as needed for mild  constipation. ?Patient not taking: Reported on 09/05/2021    [provider]  ?naloxone [facility]) 4 MG/0.1ML LIQD nasal spray kit Place 1 spray into the nose as needed (poorly responsive or turning blue). ?Patient not taking: Reported on 09/05/2021    [provider]  ?nicotine (NICODERM CQ - DOSED IN MG/24 HOURS) 21 mg/24hr patch Place 1 patch (21 mg total) onto the skin daily. ?Patient not taking: Reported on 09/05/2021 07/08/21   Thurnell Lose, MD  ?NON FORMULARY 1 application See admin instructions. Compression stockings to left leg, leave on for 12 hours then remove    [provider]  ?NON FORMULARY 1 application See admin instructions. Ice Pack under right eye every hours x 20 minutes as needed for swelling    [provider]  ?nystatin cream (MYCOSTATIN) Apply 1 application topically as needed (ro periwound). ?Patient not taking: Reported on 09/05/2021    [provider]  ?OVER THE COUNTER MEDICATION Take 30 mLs by mouth in the morning and at bedtime. Pro Heal    [provider]  ?psyllium (METAMUCIL) 58.6 % packet Take 1 packet by mouth daily as needed (constipation). ?Patient not taking: Reported on 09/05/2021    [provider]  ?simethicone (MYLICON) 80 MG chewable tablet Chew 80 mg by mouth every 6 (six) hours as needed for flatulence. ?Patient not taking: Reported on 09/05/2021    [provider]  ?   ? ?[place]

## 2021-09-05 NOTE — Sepsis Progress Note (Signed)
Elink following for Sepsis Protocol 

## 2021-09-05 NOTE — Plan of Care (Signed)
  Problem: Coping: Goal: Level of anxiety will decrease Outcome: Progressing   Problem: Elimination: Goal: Will not experience complications related to urinary retention Outcome: Progressing   

## 2021-09-05 NOTE — ED Triage Notes (Signed)
Patient BIB EMS for evaluation of shortness of breath.  States symptoms started yesterday.  Unknown if he had a fever.  Facility stated "they were unable to check it."  Hx of PNA in January.  States this "feels different."  C/o pain to R AKA.  Was given Tylenol by EMS ?

## 2021-09-05 NOTE — ED Notes (Signed)
Patient placed on 2 L [place].  Oxygen saturations drop into mid 80's when sleeping.  Hx of COPD.  ?

## 2021-09-05 NOTE — H&P (Signed)
?History and Physical  ? ? ?Patient: Gregory [name] WUJ:[id number] DOB: 09-Oct-1960 ?DOA: 09/05/2021 ?DOS: the patient was seen and examined on 09/05/2021 ?PCP: [name], MD  ?Patient coming from: SNF ? ?Chief Complaint:  ?Chief Complaint  ?Patient presents with  ? Shortness of Breath  ? ?HPI: Gregory [name] is a 61 y.o. male with medical history significant of HTN, HLD, COPD, DM2, chronic HFpEF, chronic pain, tobacco abuse, CKD 3b. Presenting with shortness of breath. Symptoms started yesterday. Reports non-productive cough and minor fevers. Didn't try any medicines at his facility for his symptoms. He denies CP, N/V, headache, diarrhea, urinary symptoms. He says when he couldn't catch his breath this morning, he became concerned. The facility called EMS. He denies any other aggravating or alleviating factors.   ? ?Review of Systems: As mentioned in the history of present illness. All other systems reviewed and are negative. ?Past Medical History:  ?Diagnosis Date  ? Acquired absence of left great toe ([place])   ? Acquired absence of right leg below knee (HCC)   ? Acute osteomyelitis of calcaneum, right ([place]) 10/02/2016  ? Acute respiratory failure ([place])   ? Amputation stump infection ([place]) 08/12/2018  ? Anemia   ? Basal cell carcinoma, eyelid   ? Cancer [facility])   ? Candidiasis 08/12/2018  ? CHF (congestive heart failure) ([place])   ? chronic diastolic   ? Chronic back pain   ? Chronic kidney disease   ? stage 3   ? Chronic multifocal osteomyelitis ([place])   ? of ankle and foot   ? Chronic pain syndrome   ? COPD (chronic obstructive pulmonary disease) ([place])   ? DDD (degenerative disc disease), lumbar   ? Depression   ? major depressive disorder   ? Diabetes mellitus without complication ([place])   ? type 2   ? Diabetic ulcer of toe of left foot ([place]) 10/02/2016  ? Difficulty in walking, not elsewhere classified   ? 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  ? Foot drop, bilateral   ? Foot drop, right 12/27/2015  ? GERD (gastroesophageal reflux  disease)   ? Herpesviral vesicular dermatitis   ? Hyperlipidemia   ? Hyperlipidemia   ? Hypertension   ? Insomnia   ? Malignant melanoma of other parts of face [facility])   ? MRSA bacteremia   ? Muscle weakness (generalized)   ? Nasal congestion   ? Necrosis of toe ([place]) 07/08/2018  ? Neuromuscular dysfunction of bladder   ? Osteomyelitis ([place])   ? Osteomyelitis ([place])   ? Osteomyelitis ([place])   ? right BKA  ? Other idiopathic peripheral autonomic neuropathy   ? Retention of urine, unspecified   ? Squamous cell carcinoma of skin   ? Unsteadiness on feet   ? Urinary retention   ? Urinary retention   ? Urinary tract infection   ? Wears dentures   ? Wears glasses   ? ?Past Surgical History:  ?Procedure Laterality Date  ? AMPUTATION Left 03/29/2017  ? Procedure: LEFT GREAT TOE AMPUTATION AT METATARSOPHALANGEAL JOINT;  Surgeon: [name], MD;  Location: [place];  Service: Orthopedics;  Laterality: Left;  ? AMPUTATION Right 03/29/2017  ? Procedure: RIGHT BELOW KNEE AMPUTATION;  Surgeon: [name], MD;  Location: [place];  Service: Orthopedics;  Laterality: Right;  ? AMPUTATION Left 06/06/2018  ? Procedure: LEFT 2ND TOE AMPUTATION;  Surgeon: [name], MD;  Location: [place];  Service: Orthopedics;  Laterality: Left;  ? AMPUTATION Right 11/19/2018  ?  Procedure: AMPUTATION ABOVE KNEE;  Surgeon: [name], MD;  Location: [place];  Service: Orthopedics;  Laterality: Right;  ? ANTERIOR CERVICAL CORPECTOMY N/A 11/25/2015  ? Procedure: ANTERIOR CERVICAL FIVE CORPECTOMY Cervical four - six fusion;  Surgeon: Consuella Lose, MD;  Location: [place] [facility];  Service: Neurosurgery;  Laterality: N/A;  ANTERIOR CERVICAL FIVE CORPECTOMY Cervical four - six fusion  ? APPLICATION OF WOUND VAC Right 10/22/2018  ? Procedure: Application Of Wound Vac;  Surgeon: [name], MD;  Location: [place];  Service: Orthopedics;  Laterality: Right;  ? CYSTOSCOPY WITH RETROGRADE URETHROGRAM N/A 04/25/2018  ? Procedure: CYSTOSCOPY WITH RETROGRADE  URETHROGRAM/ BALLOON DILATION;  Surgeon: Kathie Rhodes, MD;  Location: [facility];  Service: Urology;  Laterality: N/A;  ? MULTIPLE TOOTH EXTRACTIONS    ? POSTERIOR CERVICAL FUSION/FORAMINOTOMY N/A 11/29/2015  ? Procedure: Cervical Three-Cervical Seven Posterior Cervical Laminectomy with Fusion;  Surgeon: Consuella Lose, MD;  Location: [place] [facility];  Service: Neurosurgery;  Laterality: N/A;  Cervical Three-Cervical Seven Posterior Cervical Laminectomy with Fusion  ? STUMP REVISION Right 10/22/2018  ? Procedure: REVISION RIGHT BELOW KNEE AMPUTATION;  Surgeon: [name], MD;  Location: [place];  Service: Orthopedics;  Laterality: Right;  ? STUMP REVISION Right 12/05/2018  ? Procedure: Revision Right Above Knee Amputation;  Surgeon: [name], MD;  Location: [place];  Service: Orthopedics;  Laterality: Right;  ? STUMP REVISION Right 05/29/2019  ? Procedure: REVISION RIGHT ABOVE KNEE AMPUTATION;  Surgeon: [name], MD;  Location: [place];  Service: Orthopedics;  Laterality: Right;  ? STUMP REVISION Right 06/24/2019  ? Procedure: STUMP REVISION;  Surgeon: [name], MD;  Location: [place];  Service: Orthopedics;  Laterality: Right;  ? ?Social History:  reports that he has been smoking cigarettes. He has been smoking an average of .5 packs per day. He has never used smokeless tobacco. He reports that he does not drink alcohol and does not use drugs. ? ?No Known Allergies ? ?Family History  ?Problem Relation Age of Onset  ? Diabetes Mother   ? Heart disease Mother   ? Cancer Mother   ? Cancer Father   ? Bone cancer Father   ? Prostate cancer Father   ? Cancer Paternal Grandfather   ? Cancer Other   ? Diabetes Other   ? ? ?Prior to Admission medications   ?Medication Sig Start Date End Date Taking? Authorizing Provider  ?acetaminophen (TYLENOL) 325 MG tablet Take 650 mg by mouth every 4 (four) hours as needed (general discomfort).    Yes [provider]  ?amLODipine (NORVASC) 10 MG tablet Take 10 mg by mouth  daily. for HTN   Yes [provider]  ?buPROPion (WELLBUTRIN XL) 300 MG 24 hr tablet Take 300 mg by mouth daily.   Yes [provider]  ?cetirizine (ZYRTEC) 10 MG tablet Take 10 mg by mouth daily.   Yes [provider]  ?diclofenac Sodium (VOLTAREN) 1 % GEL Apply 4 g topically every 6 (six) hours as needed (right leg pain).   Yes [provider]  ?docusate sodium (COLACE) 100 MG capsule Take 1 capsule (100 mg total) by mouth 2 (two) times daily. 05/31/19  Yes Amin, Jeanella Flattery, MD  ?DULoxetine (CYMBALTA) 60 MG capsule Take 60 mg by mouth daily.   Yes [provider]  ?Eszopiclone 3 MG TABS Take 3 mg by mouth at bedtime. 09/29/20  Yes [provider]  ?ferrous sulfate 325 (65 FE) MG tablet  Take 1 tablet (325 mg total) by mouth daily with breakfast. 11/23/18  Yes Matilde Haymaker, MD  ?furosemide (LASIX) 20 MG tablet Take 20 mg by mouth See admin instructions. Give '20mg'$  by mouth every Tuesday, Thursday, Saturday, Sunday for leg swelling   Yes [provider]  ?gabapentin (NEURONTIN) 300 MG capsule Take 300 mg by mouth 3 (three) times daily.   Yes [provider]  ?glipiZIDE (GLUCOTROL) 10 MG tablet Take 10 mg by mouth daily before breakfast.   Yes [provider]  ?hydrOXYzine (ATARAX) 25 MG tablet Take 25 mg by mouth every 6 (six) hours as needed for itching.   Yes [provider]  ?insulin glargine (LANTUS) 100 UNIT/ML injection Inject 12 Units into the skin at bedtime.   Yes [provider]  ?insulin lispro (HUMALOG) 100 UNIT/ML KwikPen Inject 0-10 Units into the skin See admin instructions. Per sliding scale before meals and at bedtime. ?<70 or >400 = call md ?151-200 = 2 units ?201-50 = 4 units ?251-300 = 6 units ?301-350 =  8 units ?351-400 = 10 units   Yes [provider]  ?magnesium oxide (MAG-OX) 400 MG tablet Take 400 mg by mouth 2 (two) times daily.   Yes [provider]  ?Menthol, Topical  Analgesic, (BIOFREEZE) 4 % GEL Apply 1 application topically every 8 (eight) hours as needed (pain to right leg/stump).   Yes [provider]  ?metoprolol tartrate (LOPRESSOR) 25 MG tablet Take 0.5 tablets

## 2021-09-06 DIAGNOSIS — E871 Hypo-osmolality and hyponatremia: Secondary | ICD-10-CM

## 2021-09-06 DIAGNOSIS — E1169 Type 2 diabetes mellitus with other specified complication: Secondary | ICD-10-CM | POA: Diagnosis not present

## 2021-09-06 DIAGNOSIS — N1832 Chronic kidney disease, stage 3b: Secondary | ICD-10-CM

## 2021-09-06 DIAGNOSIS — J9601 Acute respiratory failure with hypoxia: Secondary | ICD-10-CM | POA: Diagnosis not present

## 2021-09-06 DIAGNOSIS — E785 Hyperlipidemia, unspecified: Secondary | ICD-10-CM

## 2021-09-06 DIAGNOSIS — I5032 Chronic diastolic (congestive) heart failure: Secondary | ICD-10-CM | POA: Diagnosis not present

## 2021-09-06 DIAGNOSIS — I10 Essential (primary) hypertension: Secondary | ICD-10-CM

## 2021-09-06 DIAGNOSIS — J189 Pneumonia, unspecified organism: Secondary | ICD-10-CM | POA: Diagnosis not present

## 2021-09-06 DIAGNOSIS — D649 Anemia, unspecified: Secondary | ICD-10-CM

## 2021-09-06 DIAGNOSIS — E1149 Type 2 diabetes mellitus with other diabetic neurological complication: Secondary | ICD-10-CM

## 2021-09-06 LAB — CBC WITH DIFFERENTIAL/PLATELET
Abs Immature Granulocytes: 0.04 10*3/uL (ref 0.00–0.07)
Basophils Absolute: 0 10*3/uL (ref 0.0–0.1)
Basophils Relative: 1 %
Eosinophils Absolute: 0.3 10*3/uL (ref 0.0–0.5)
Eosinophils Relative: 3 %
HCT: 32.2 % — ABNORMAL LOW (ref 39.0–52.0)
Hemoglobin: 9.7 g/dL — ABNORMAL LOW (ref 13.0–17.0)
Immature Granulocytes: 1 %
Lymphocytes Relative: 24 %
Lymphs Abs: 2 10*3/uL (ref 0.7–4.0)
MCH: 27.1 pg (ref 26.0–34.0)
MCHC: 30.1 g/dL (ref 30.0–36.0)
MCV: 89.9 fL (ref 80.0–100.0)
Monocytes Absolute: 0.5 10*3/uL (ref 0.1–1.0)
Monocytes Relative: 6 %
Neutro Abs: 5.6 10*3/uL (ref 1.7–7.7)
Neutrophils Relative %: 65 %
Platelets: 177 10*3/uL (ref 150–400)
RBC: 3.58 MIL/uL — ABNORMAL LOW (ref 4.22–5.81)
RDW: 15.6 % — ABNORMAL HIGH (ref 11.5–15.5)
WBC: 8.5 10*3/uL (ref 4.0–10.5)
nRBC: 0 % (ref 0.0–0.2)

## 2021-09-06 LAB — GLUCOSE, CAPILLARY
Glucose-Capillary: 123 mg/dL — ABNORMAL HIGH (ref 70–99)
Glucose-Capillary: 158 mg/dL — ABNORMAL HIGH (ref 70–99)
Glucose-Capillary: 147 mg/dL — ABNORMAL HIGH (ref 70–99)
Glucose-Capillary: 153 mg/dL — ABNORMAL HIGH (ref 70–99)

## 2021-09-06 LAB — COMPREHENSIVE METABOLIC PANEL
ALT: 10 U/L (ref 0–44)
AST: 15 U/L (ref 15–41)
Albumin: 2.9 g/dL — ABNORMAL LOW (ref 3.5–5.0)
Alkaline Phosphatase: 67 U/L (ref 38–126)
Anion gap: 8 (ref 5–15)
BUN: 33 mg/dL — ABNORMAL HIGH (ref 8–23)
CO2: 26 mmol/L (ref 22–32)
Calcium: 9 mg/dL (ref 8.9–10.3)
Chloride: 103 mmol/L (ref 98–111)
Creatinine, Ser: 1.88 mg/dL — ABNORMAL HIGH (ref 0.61–1.24)
GFR, Estimated: 40 mL/min — ABNORMAL LOW (ref >60)
Glucose, Bld: 132 mg/dL — ABNORMAL HIGH (ref 70–99)
Potassium: 4.9 mmol/L (ref 3.5–5.1)
Sodium: 137 mmol/L (ref 135–145)
Total Bilirubin: 0.5 mg/dL (ref 0.3–1.2)
Total Protein: 6.7 g/dL (ref 6.5–8.1)

## 2021-09-06 LAB — STREP PNEUMONIAE URINARY ANTIGEN: Strep Pneumo Urinary Antigen: NEGATIVE

## 2021-09-06 LAB — HEMOGLOBIN A1C
Hgb A1c MFr Bld: 7.5 % — ABNORMAL HIGH (ref 4.8–5.6)
Mean Plasma Glucose: 168.55 mg/dL

## 2021-09-06 LAB — MRSA NEXT GEN BY PCR, NASAL: MRSA by PCR Next Gen: DETECTED — AB

## 2021-09-06 MED ORDER — ZOLPIDEM TARTRATE 5 MG PO TABS
5.0000 mg | ORAL_TABLET | Freq: Every day | ORAL | Status: DC
  Administered 2021-09-06 – 2021-09-07 (×2): 5 mg via ORAL
  Filled 2021-09-06 (×2): qty 1

## 2021-09-06 MED ORDER — FUROSEMIDE 20 MG PO TABS
20.0000 mg | ORAL_TABLET | ORAL | Status: DC
  Administered 2021-09-07: 20 mg via ORAL
  Filled 2021-09-06: qty 1

## 2021-09-06 MED ORDER — MELATONIN 5 MG PO TABS
5.0000 mg | ORAL_TABLET | Freq: Once | ORAL | Status: AC
  Administered 2021-09-06: 5 mg via ORAL
  Filled 2021-09-06: qty 1

## 2021-09-06 MED ORDER — INSULIN DETEMIR 100 UNIT/ML ~~LOC~~ SOLN: 10.0000 [IU] | Freq: Two times a day (BID) | SUBCUTANEOUS | Status: DC

## 2021-09-06 MED ORDER — OXYCODONE HCL 5 MG PO TABS
30.0000 mg | ORAL_TABLET | Freq: Three times a day (TID) | ORAL | Status: DC
  Administered 2021-09-06 – 2021-09-08 (×6): 30 mg via ORAL
  Filled 2021-09-06 (×6): qty 6

## 2021-09-06 MED ORDER — INSULIN DETEMIR 100 UNIT/ML ~~LOC~~ SOLN
5.0000 [IU] | Freq: Two times a day (BID) | SUBCUTANEOUS | Status: DC
  Administered 2021-09-06 – 2021-09-08 (×5): 5 [IU] via SUBCUTANEOUS
  Filled 2021-09-06 (×6): qty 0.05

## 2021-09-06 MED ORDER — FUROSEMIDE 40 MG PO TABS: 40.0000 mg | ORAL_TABLET | ORAL | Status: DC

## 2021-09-06 MED ORDER — FUROSEMIDE 40 MG PO TABS
40.0000 mg | ORAL_TABLET | ORAL | Status: DC
  Administered 2021-09-06: 40 mg via ORAL
  Filled 2021-09-06: qty 1

## 2021-09-06 MED ORDER — INSULIN ASPART 100 UNIT/ML IJ SOLN
6.0000 [IU] | Freq: Three times a day (TID) | INTRAMUSCULAR | Status: DC
  Administered 2021-09-06 – 2021-09-08 (×6): 6 [IU] via SUBCUTANEOUS

## 2021-09-06 MED ORDER — OXYCODONE HCL 5 MG PO TABS
5.0000 mg | ORAL_TABLET | Freq: Three times a day (TID) | ORAL | Status: DC | PRN
  Administered 2021-09-06 – 2021-09-08 (×6): 5 mg via ORAL
  Filled 2021-09-06 (×6): qty 1

## 2021-09-06 MED ORDER — OXYCODONE HCL 5 MG PO CAPS: 5.0000 mg | ORAL_CAPSULE | Freq: Three times a day (TID) | ORAL | Status: DC | PRN

## 2021-09-06 MED ORDER — LORAZEPAM 0.5 MG PO TABS
0.2500 mg | ORAL_TABLET | Freq: Two times a day (BID) | ORAL | Status: DC | PRN
  Administered 2021-09-06: 0.25 mg via ORAL
  Filled 2021-09-06: qty 1

## 2021-09-06 MED ORDER — INSULIN ASPART 100 UNIT/ML IJ SOLN
0.0000 [IU] | Freq: Three times a day (TID) | INTRAMUSCULAR | Status: DC
  Administered 2021-09-06 – 2021-09-07 (×4): 3 [IU] via SUBCUTANEOUS
  Administered 2021-09-07 – 2021-09-08 (×2): 2 [IU] via SUBCUTANEOUS
  Administered 2021-09-08: 3 [IU] via SUBCUTANEOUS

## 2021-09-06 MED ORDER — INSULIN ASPART 100 UNIT/ML IJ SOLN: 0.0000 [IU] | Freq: Every day | INTRAMUSCULAR | Status: DC

## 2021-09-06 MED ORDER — PANTOPRAZOLE SODIUM 40 MG PO TBEC
40.0000 mg | DELAYED_RELEASE_TABLET | Freq: Every day | ORAL | Status: DC
  Administered 2021-09-06 – 2021-09-08 (×3): 40 mg via ORAL
  Filled 2021-09-06 (×3): qty 1

## 2021-09-06 MED ORDER — TIOTROPIUM BROMIDE MONOHYDRATE 1.25 MCG/ACT IN AERS: 2.0000 | INHALATION_SPRAY | Freq: Every day | RESPIRATORY_TRACT | Status: DC

## 2021-09-06 NOTE — Plan of Care (Signed)
  Problem: Nutrition: Goal: Adequate nutrition will be maintained Outcome: Progressing   Problem: Elimination: Goal: Will not experience complications related to bowel motility Outcome: Progressing Goal: Will not experience complications related to urinary retention Outcome: Progressing   

## 2021-09-06 NOTE — Progress Notes (Signed)
PT Cancellation Note ? ?Patient Details ?Name: Gregory [name] ?MRN: [id number] ?DOB: Dec 10, 1960 ? ? ?Cancelled Treatment:     Talked with pt at bedside. He understands PT and why he would need to be seen by PT however declined moving or being assessed today. PT reports he is having to much pain in his R AKA and does not want to move. Pt reports that he is from long term care at [place] and completes his own transfers from bed to scooter daily. He stated he did not have PT when he returned to his facility after his last admission either. He feels confident that he should be able to return and continue to complete his transfers as he has done before.  ? ?I did however state that if she stays here for a few days, he will need to complete his tranfers out of bed to make sure he doesn't get weak and that he can still do them. He agreed.  ? ?If timing is such that we don't get to assess him before he heads back to his facility , pt seems confident he should be fine with the facilities support.  ? ? Clide Dales ?09/06/2021, 12:38 PM ?Gatha Mayer, PT, MPT ?Acute Rehabilitation Services ?Office: [phone] ?Pager: [phone] ?09/06/2021 ? ?

## 2021-09-06 NOTE — Progress Notes (Signed)
[facility] ?PROGRESS NOTE ? ? ? ?Progress Note  ?Gregory [name]  BOF:[id number] DOB: 07/19/1960 DOA: 09/05/2021 ?PCP: [name], MD  ? ? ? ?Brief Narrative:  ? ?Gregory [name] is an 61 y.o. male past medical history significant for essential hypertension, COPD, diabetes mellitus type 2, chronic diastolic heart failure, chronic kidney disease stage IIIb comes in with shortness of breath that started the day prior to admission with nausea and productive cough and a mild fever, chest x-ray showed bilateral patchy infiltrates. ? ? ? ?Assessment/Plan:  ? ?Acute respiratory failure with hypoxia secondarily to Multifocal pneumonia ?She is not requiring 2 L of oxygen to keep saturations greater 92%. ?Was started empirically on IV vancomycin and cefepime. ?SARS-CoV-2 and influenza PCR remain negative. ?MRSA and Legionella are pending. ?Out of bed to chair, incentive spirometry, encourage flutter valve and consult physical therapy. ? ?Mild hyponatremia: ?Basic metabolic panels pending this morning. ? ?COPD: ?Not on oxygen at home continue inhalers and as needed inhalers. ? ?Diabetes mellitus type 2 with hyperglycemia: ?Blood glucose trending high and was seen as pending. ? ?Chronic diastolic heart failure: ?Appears euvolemic,continue Lasix and metoprolol. ? ?Essential hypertension: ?Continue metoprolol and Lasix. ? ?Dyslipidemia: ?Continue statins. ? ?Chronic lymphedema of the left lower extremities: ?Continue current home regimen. ? ?Chronic kidney disease stage IIIb: ?His creatinine appears to be at baseline. ? ?Chronic pain: ?The admitting physician discussed with the patient that they were not can escalate treatment beyond what he regularly takes he acknowledge. ?He was continued on his home regimen. ? ?Tobacco abuse: ?Nicotine patch. ? ?Anxiety/depression: ?Continue current home regimen. ? ?BPH: ?Continue Flomax. ? ?Anemia of chronic renal disease ?Hemoglobin has remained relatively stable. ?No signs of overt  bleeding. ?Baseline hemoglobin ranges anywhere from 7.9-9.2. ? ?Sacral decubitus ulcer present on admission stage I ?RN Pressure Injury Documentation: ?Pressure Injury Buttocks Right Deep Tissue Pressure Injury - Purple or maroon localized area of discolored intact skin or blood-filled blister due to damage of underlying soft tissue from pressure and/or shear. (Active)  ?   ?Location: Buttocks  ?Location Orientation: Right  ?Staging: Deep Tissue Pressure Injury - Purple or maroon localized area of discolored intact skin or blood-filled blister due to damage of underlying soft tissue from pressure and/or shear.  ?Wound Description (Comments):   ?Present on Admission: Yes  ? ? ?Estimated body mass index is 30.56 kg/m? as calculated from the following: ?  Height as of 06/26/21: 6' (1.829 m). ?  Weight as of 07/07/21: 102.2 kg. ? ? ? ?DVT prophylaxis: lovenox ?Family Communication:none ?Status is: Inpatient ?Remains inpatient appropriate because: Acute respiratory failure with hypoxia due to community-acquired pneumonia ? ? ? ?Code Status:  ? ?  ?Code Status Orders  ?(From admission, onward)  ?  ? ? ?  ? ?  Start     Ordered  ? 09/05/21 0804  Full code  Continuous       ? 09/05/21 0803  ? ?  ?  ? ?  ? ?Code Status History   ? ? Date Active Date Inactive Code Status Order ID Comments User Context  ? 06/26/2021 0351 07/07/2021 2033 Full Code [id number]  Estill Cotta, NP ED  ? 07/11/2019 1312 07/18/2019 1807 Full Code [id number]  Truett Mainland, DO ED  ? 06/22/2019 1730 06/27/2019 1816 Full Code [id number]  Lequita Halt, MD ED  ? 05/23/2019 0543 05/31/2019 1946 Full Code [id number]  Shela Leff, MD ED  ? 12/05/2018 1756 12/10/2018 0057 Full Code  [id number]  Milas Gain, [name] Inpatient  ? 11/16/2018 1809 11/22/2018 1904 Full Code [id number]  Sherene Sires, DO ED  ? 10/22/2018 1546 10/24/2018 1610 Full Code [id number]  [name], MD Inpatient  ? 06/03/2018 2131 06/09/2018 1546 Full Code [id number]  Bonnielee Haff, MD  Inpatient  ? 03/25/2017 1724 04/02/2017 1951 Full Code [id number]  Geradine Girt, DO ED  ? 11/07/2015 0631 12/02/2015 1655 Full Code [id number]  Chesley Mires, MD ED  ? ?  ? ? ? ? ?IV Access:  ? ?Peripheral IV ? ? ?Procedures and diagnostic studies:  ? ?DG Chest Portable 1 View ? ?Result Date: 09/05/2021 ?CLINICAL DATA:  Shortness of breath EXAM: PORTABLE CHEST 1 VIEW COMPARISON:  07/05/2021 FINDINGS: Indistinct opacity in the bilateral lungs. Borderline heart size for technique. Stable aortic and hilar contours. Extensive cervical spine fusion IMPRESSION: Patchy bilateral pulmonary infiltrate. Electronically Signed   By: Jorje Guild M.D.   On: 09/05/2021 05:57   ? ? ?Medical Consultants:  ? ?None. ? ? ?Subjective:  ? ? ?Gregory [name] relates his breathing is about the same, asking for narcotics. ? ?Objective:  ? ? ?Vitals:  ? 09/05/21 1339 09/05/21 1620 09/05/21 2204 09/06/21 0630  ?BP: (!) 142/64 132/63 (!) 141/65 (!) 141/65  ?Pulse: 61 (!) 58 (!) 52 (!) 51  ?Resp:  '20 20 16  '$ ?Temp:  98.1 ?F (36.7 ?C) 98.6 ?F (37 ?C) 98.1 ?F (36.7 ?C)  ?TempSrc:  Oral  Oral  ?SpO2:  96% 99% 95%  ? ?SpO2: 95 % ?O2 Flow Rate (L/min): 2 L/min ? ? ?Intake/Output Summary (Last 24 hours) at 09/06/2021 0711 ?Last data filed at 09/06/2021 9150 ?Gross per 24 hour  ?Intake 1984.07 ml  ?Output 2600 ml  ?Net -615.93 ml  ? ?There were no vitals filed for this visit. ? ?Exam: ?General exam: In no acute distress. ?Respiratory system: Good air movement and diffuse crackles bilaterally. ?Cardiovascular system: S1 & S2 heard, RRR. No JVD. ?Gastrointestinal system: Abdomen is nondistended, soft and nontender.  ?Extremities: Right lower extremity below the knee amputation ?Skin: Lymphedema changes ?Psychiatry: Judgement and insight appear normal. Mood & affect appropriate.  ? ? ?Data Reviewed:  ? ? ?Labs: ?Basic Metabolic Panel: ?Recent Labs  ?Lab 09/05/21 ?5697  ?NA 133*  ?K 5.1  ?CL 103  ?CO2 23  ?GLUCOSE 268*  ?BUN 34*  ?CREATININE 1.88*  ?CALCIUM  8.7*  ? ?GFR ?CrCl cannot be calculated (Unknown ideal weight.). ?Liver Function Tests: ?Recent Labs  ?Lab 09/05/21 ?9480  ?AST 14*  ?ALT 12  ?ALKPHOS 81  ?BILITOT 0.3  ?PROT 7.4  ?ALBUMIN 3.5  ? ?No results for input(s): LIPASE, AMYLASE in the last 168 hours. ?No results for input(s): AMMONIA in the last 168 hours. ?Coagulation profile ?No results for input(s): INR, PROTIME in the last 168 hours. ?COVID-19 Labs ? ?No results for input(s): DDIMER, FERRITIN, LDH, CRP in the last 72 hours. ? ?Lab Results  ?Component Value Date  ? [place] NEGATIVE 09/05/2021  ? [place] NEGATIVE 07/06/2021  ? [place] NEGATIVE 06/26/2021  ? [place] NEGATIVE 06/25/2019  ? ? ?CBC: ?Recent Labs  ?Lab 09/05/21 ?1655 09/05/21 ?1105  ?WBC 17.5* 17.0*  ?NEUTROABS 15.6*  --   ?HGB 10.2* 10.5*  ?HCT 32.7* 34.6*  ?MCV 88.4 89.4  ?PLT 204 217  ? ?Cardiac Enzymes: ?No results for input(s): CKTOTAL, CKMB, CKMBINDEX, TROPONINI in the last 168 hours. ?BNP (last 3 results) ?No results for input(s): PROBNP in the last 8760 hours. ?CBG: ?Recent Labs  ?  Lab 09/05/21 ?1209 09/05/21 ?1642 09/05/21 ?2207  ?GLUCAP 159* 182* 316*  ? ?D-Dimer: ?No results for input(s): DDIMER in the last 72 hours. ?Hgb A1c: ?No results for input(s): HGBA1C in the last 72 hours. ?Lipid Profile: ?No results for input(s): CHOL, HDL, LDLCALC, TRIG, CHOLHDL, LDLDIRECT in the last 72 hours. ?Thyroid function studies: ?No results for input(s): TSH, T4TOTAL, T3FREE, THYROIDAB in the last 72 hours. ? ?Invalid input(s): FREET3 ?Anemia work up: ?No results for input(s): VITAMINB12, FOLATE, FERRITIN, TIBC, IRON, RETICCTPCT in the last 72 hours. ?Sepsis Labs: ?Recent Labs  ?Lab 09/05/21 ?4431 09/05/21 ?5400 09/05/21 ?1105  ?WBC 17.5*  --  17.0*  ?LATICACIDVEN 1.8 0.9  --   ? ?Microbiology ?Recent Results (from the past 240 hour(s))  ?Resp Panel by RT-PCR (Flu A&B, Covid) Nasopharyngeal Swab     Status: None  ? Collection Time: 09/05/21  5:37 AM  ? Specimen: Nasopharyngeal  Swab; Nasopharyngeal(NP) swabs in vial transport medium  ?Result Value Ref Range Status  ? SARS Coronavirus 2 by RT PCR NEGATIVE NEGATIVE Final  ?  Comment: (NOTE) ?SARS-CoV-2 target nucleic acids are NOT DETEC

## 2021-09-06 NOTE — TOC Initial Note (Signed)
Transition of Care (TOC) - Initial/Assessment Note  ? ? ?Patient Details  ?Name: Gregory [name] ?MRN: [id number] ?Date of Birth: 1961/03/21 ? ?Transition of Care (TOC) CM/SW Contact:    ?Tawanna Cooler, RN ?Phone Number: ?09/06/2021, 4:44 PM ? ?Clinical Narrative:                 ? ?Spoke with [place] at [facility] ([facility] and she confirms patient is a LTC resident there.  Plan is for him to return once medically stable.  TOC CM following.  ? ?Expected Discharge Plan: [place] ?Barriers to Discharge: Continued Medical Work up ? ? ?Expected Discharge Plan and Services ?Expected Discharge Plan: [place] ?  ?  ?  ?Living arrangements for the past 2 months: [place] ?                ?  ?Prior Living Arrangements/Services ?Living arrangements for the past 2 months: [place] ?  ?Patient language and need for interpreter reviewed:: Yes ?Do you feel safe going back to the place where you live?: Yes      ?Need for Family Participation in Patient Care: Yes (Comment) ?Care giver support system in place?: Yes (comment) ?  ?Criminal Activity/Legal Involvement Pertinent to Current Situation/Hospitalization: No - Comment as needed ? ?Activities of Daily Living ?Home Assistive Devices/Equipment: Wheelchair ?ADL Screening (condition at time of admission) ?Patient's cognitive ability adequate to safely complete daily activities?: Yes ?Is the patient deaf or have difficulty hearing?: No ?Does the patient have difficulty seeing, even when wearing glasses/contacts?: No ?Does the patient have difficulty concentrating, remembering, or making decisions?: No ?Patient able to express need for assistance with ADLs?: Yes ?Does the patient have difficulty dressing or bathing?: Yes ?Independently performs ADLs?: No ?Communication: Independent ?Dressing (OT): Needs assistance ?Is this a change from baseline?: Pre-admission baseline ?Grooming: Needs assistance ?Is this a  change from baseline?: Pre-admission baseline ?Feeding: Independent ?Bathing: Needs assistance ?Is this a change from baseline?: Pre-admission baseline ?Toileting: Needs assistance ?Is this a change from baseline?: Pre-admission baseline ?In/Out Bed: Needs assistance ?Is this a change from baseline?: Pre-admission baseline ?Walks in Home: Needs assistance ?Is this a change from baseline?: Pre-admission baseline ?Does the patient have difficulty walking or climbing stairs?: Yes ?Weakness of Legs: Left ?Weakness of Arms/Hands: None (rt aka) ? ?  ?Orientation: : Oriented to Self, Oriented to Place, Oriented to  Time, Oriented to Situation ?Alcohol / Substance Use: Tobacco Use ?Psych Involvement: No (comment) ? ?Admission diagnosis:  Acute respiratory failure with hypoxia ([place]) [J96.01] ?HCAP (healthcare-associated pneumonia) [J18.9] ?Multifocal pneumonia [J18.9] ?Patient Active Problem List  ? Diagnosis Date Noted  ? Multifocal pneumonia 09/05/2021  ? Hyponatremia 09/05/2021  ? Anxiety 09/05/2021  ? BPH (benign prostatic hyperplasia) 09/05/2021  ? 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 Asymptomatic bacteriuria 06/30/2021  ? Normocytic anemia 06/29/2021  ? Pain of amputation stump of right lower extremity ([place]) 03/15/2021  ? Melanoma of cheek (Cones[place]) 10/15/2019  ? Acute respiratory failure with hypoxia ([place]) 07/11/2019  ? Acute respiratory disease due to COVID-19 virus 07/11/2019  ? Wound infection 06/22/2019  ? Abscess 06/22/2019  ? Stage 3b chronic kidney disease (CKD) ([place]) 05/23/2019  ? Tobacco use 05/23/2019  ? Sepsis ([place]) 05/23/2019  ? S/P AKA (above knee amputation) unilateral, right ([place]assett) 12/05/2018  ? Amputation stump infection ([place])   ? Lymphedema of left leg   ? Diabetic polyneuropathy associated with type 2 diabetes mellitus ([place]ott)   ?  Above knee amputation of right lower extremity ([place]) 10/22/2018  ? Dehiscence of amputation stump (HCC)   ? 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Block[place]) 08/12/2018  ?  Candidiasis 08/12/2018  ? Necrosis of toe (Chase[place]) 07/08/2018  ? Amputated toe of left foot ([place]) 06/18/2018  ? Streptococcal infection   ? Pressure injury, stage 3 ([place]land) 06/06/2018  ? Cutaneous abscess of left foot   ? Moderate protein-calorie malnutrition ([place])   ? Septic arthritis ([place]) 06/03/2018  ? Idiopathic chronic venous hypertension of left lower extremity with ulcer and inflammation ([place]aredo) 11/14/2017  ? Great toe amputation status, left 11/14/2017  ? Enlarged lymph node in neck 07/18/2017  ? Malignant melanoma of face ([place]) 04/16/2017  ? Osteomyelitis ([place]) 03/25/2017  ? Skin cancer 03/25/2017  ? Depression 11/05/2016  ? Diabetic ulcer of toe of left foot ([place]) 10/02/2016  ? 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  ? Chronic pain 09/27/2016  ? Dyslipidemia associated with type 2 diabetes mellitus ([place]) 06/06/2016  ? GERD without esophagitis 05/11/2016  ? Hypertensive heart disease with congestive heart failure ([place]) 12/15/2015  ? Spinal stenosis in cervical region 12/15/2015  ? Type II diabetes mellitus with neurological manifestations ([place]annelburg) 12/15/2015  ? Chronic diastolic CHF (congestive heart failure) ([place])   ? Pulmonary hypertension ([place]es)   ? HTN (hypertension)   ? HLD (hyperlipidemia)   ? Urinary retention   ? Diskitis   ? Foot drop, bilateral   ? Sepsis with acute renal failure without septic shock ([place])   ? COPD (chronic obstructive pulmonary disease) ([place]) 10/03/2015  ? Acute osteomyelitis of left foot ([place]akdale) 10/03/2015  ? MRSA bacteremia 08/13/2015  ? Chronic low back pain 08/13/2015  ? Chronic multifocal osteomyelitis, multiple sites ([place]) 08/13/2015  ? Community acquired pneumonia 08/13/2015  ? ?PCP:  [name], MD ?Pharmacy:  No Pharmacies Listed ? ? ?Readmission Risk Interventions ? ?  07/03/2021  ?  2:54 PM 06/23/2019  ? 10:33 AM  ?Readmission Risk Prevention Plan  ?Transportation Screening Complete Complete  ?PCP or Specialist Appt within 3-5 Days  Not Complete  ?Not Complete comments  SNF pt  ?[place] or  Home Care Consult  Complete  ?Social Work Consult for [place] Planning/Counseling  Complete  ?Palliative Care Screening  Not Applicable  ?Medication Review (RN Care Manager) Referral to Pharmacy Referral to Pharmacy  ?PCP or Specialist appointment within 3-5 days of discharge Complete Not Complete  ?PCP/Specialist Appt Not Complete comments  SNF pt  ?[place] or Home Care Consult Complete Complete  ?SW Recovery Care/Counseling Consult Complete Complete  ?Palliative Care Screening Not Applicable Not Applicable  ?Skilled Nursing Facility Complete Complete  ? ? ? ?

## 2021-09-07 DIAGNOSIS — E1169 Type 2 diabetes mellitus with other specified complication: Secondary | ICD-10-CM | POA: Diagnosis not present

## 2021-09-07 DIAGNOSIS — I5032 Chronic diastolic (congestive) heart failure: Secondary | ICD-10-CM | POA: Diagnosis not present

## 2021-09-07 DIAGNOSIS — J189 Pneumonia, unspecified organism: Secondary | ICD-10-CM | POA: Diagnosis not present

## 2021-09-07 DIAGNOSIS — J9601 Acute respiratory failure with hypoxia: Secondary | ICD-10-CM | POA: Diagnosis not present

## 2021-09-07 LAB — LEGIONELLA PNEUMOPHILA SEROGP 1 UR AG: L. pneumophila Serogp 1 Ur Ag: NEGATIVE

## 2021-09-07 LAB — GLUCOSE, CAPILLARY
Glucose-Capillary: 193 mg/dL — ABNORMAL HIGH (ref 70–99)
Glucose-Capillary: 137 mg/dL — ABNORMAL HIGH (ref 70–99)
Glucose-Capillary: 183 mg/dL — ABNORMAL HIGH (ref 70–99)
Glucose-Capillary: 95 mg/dL (ref 70–99)

## 2021-09-07 MED ORDER — AMOXICILLIN-POT CLAVULANATE 875-125 MG PO TABS
1.0000 | ORAL_TABLET | Freq: Two times a day (BID) | ORAL | Status: DC
Start: 2021-09-07 — End: 2021-09-08
  Administered 2021-09-07 – 2021-09-08 (×3): 1 via ORAL
  Filled 2021-09-07 (×3): qty 1

## 2021-09-07 MED ORDER — AZITHROMYCIN 250 MG PO TABS
500.0000 mg | ORAL_TABLET | Freq: Every day | ORAL | Status: DC
  Administered 2021-09-07 – 2021-09-08 (×2): 500 mg via ORAL
  Filled 2021-09-07 (×2): qty 2

## 2021-09-07 NOTE — Plan of Care (Signed)
  Problem: Activity: Goal: Risk for activity intolerance will decrease Outcome: Progressing   Problem: Nutrition: Goal: Adequate nutrition will be maintained Outcome: Progressing   Problem: Coping: Goal: Level of anxiety will decrease Outcome: Progressing   Problem: Elimination: Goal: Will not experience complications related to bowel motility Outcome: Progressing Goal: Will not experience complications related to urinary retention Outcome: Progressing   

## 2021-09-07 NOTE — NC FL2 (Addendum)
?[place] MEDICAID FL2 LEVEL OF CARE SCREENING TOOL  ?  ? ?IDENTIFICATION  ?Patient Name: ?Gregory [name] Birthdate: 1960-11-18 Sex: male Admission Date (Current Location): ?09/05/2021  ?[place] and [place] Number: ? [place] ?  Facility and Address:  ?[facility],  [place], [place] ?     Provider Number: ?[id number]  ?Attending Physician Name and Address:  ?Charlynne Cousins, MD ? Relative Name and Phone Number:  ?[name], daughter, [phone] ?   ?Current Level of Care: ?Hospital Recommended Level of Care: ?[place] Prior Approval Number: ?  ? ?Date Approved/Denied: ?04/11/12 PASRR Number: ?[id number] ? ?Discharge Plan: ?SNF ?  ? ?Current Diagnoses: ?Patient Active Problem List  ? Diagnosis Date Noted  ? Multifocal pneumonia 09/05/2021  ? Hyponatremia 09/05/2021  ? Anxiety 09/05/2021  ? BPH (benign prostatic hyperplasia) 09/05/2021  ? 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 Asymptomatic bacteriuria 06/30/2021  ? Normocytic anemia 06/29/2021  ? Pain of amputation stump of right lower extremity ([place]) 03/15/2021  ? Melanoma of cheek (Ellsworth) 10/15/2019  ? Acute respiratory failure with hypoxia (Raymond) 07/11/2019  ? Acute respiratory disease due to COVID-19 virus 07/11/2019  ? Wound infection 06/22/2019  ? Abscess 06/22/2019  ? Stage 3b chronic kidney disease (CKD) ([place]ake) 05/23/2019  ? Tobacco use 05/23/2019  ? Sepsis ([place]) 05/23/2019  ? S/P AKA (above knee amputation) unilateral, right (Hand) 12/05/2018  ? Amputation stump infection ([place])   ? Lymphedema of left leg   ? Diabetic polyneuropathy associated with type 2 diabetes mellitus ([place]ra[place])   ? Above knee amputation of right lower extremity ([place]) 10/22/2018  ? Dehiscence of amputation stump (HCC)   ? 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[place]) 08/12/2018  ? Candidiasis 08/12/2018  ? Necrosis of toe (Hutch[place]) 07/08/2018  ? Amputated toe of left foot (Fulton) 06/18/2018  ? Streptococcal infection   ? Pressure injury,  stage 3 (Walton) 06/06/2018  ? Cutaneous abscess of left foot   ? Moderate protein-calorie malnutrition ([place])   ? Septic arthritis ([place]land) 06/03/2018  ? Idiopathic chronic venous hypertension of left lower extremity with ulcer and inflammation (G[place]am) 11/14/2017  ? Great toe amputation status, left 11/14/2017  ? Enlarged lymph node in neck 07/18/2017  ? Malignant melanoma of face (Med[place] Lake) 04/16/2017  ? Osteomyelitis ([place]fax) 03/25/2017  ? Skin cancer 03/25/2017  ? Depression 11/05/2016  ? Diabetic ulcer of toe of left foot (Cobb [place]) 10/02/2016  ? 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  ? Chronic pain 09/27/2016  ? Dyslipidemia associated with type 2 diabetes mellitus (T[place]) 06/06/2016  ? GERD without esophagitis 05/11/2016  ? Hypertensive heart disease with congestive heart failure ([place]) 12/15/2015  ? Spinal stenosis in cervical region 12/15/2015  ? Type II diabetes mellitus with neurological manifestations ([place]) 12/15/2015  ? Chronic diastolic CHF (congestive heart failure) (Saks)   ? Pulmonary hypertension (Horton)   ? HTN (hypertension)   ? HLD (hyperlipidemia)   ? Urinary retention   ? Diskitis   ? Foot drop, bilateral   ? Sepsis with acute renal failure without septic shock (Gord[place]ville)   ? COPD (chronic obstructive pulmonary disease) ([place]) 10/03/2015  ? Acute osteomyelitis of left foot (Stone[place]) 10/03/2015  ? MRSA bacteremia 08/13/2015  ? Chronic low back pain 08/13/2015  ? Chronic multifocal osteomyelitis, multiple sites ([place]aver[place]) 08/13/2015  ? Community acquired pneumonia 08/13/2015  ? ? ?Orientation RESPIRATION BLADDER Height & Weight   ?  ?Self, Time, Situation, Place ? O2 (2L O2 [place]) Indwelling catheter Weight: 114 kg ?Height:  6' (182.9 cm) (per pt)  ?BEHAVIORAL SYMPTOMS/MOOD NEUROLOGICAL BOWEL NUTRITION STATUS  ?    Incontinent Diet (carb modified; fluid restriction)  ?AMBULATORY STATUS COMMUNICATION OF NEEDS Skin   ?Extensive Assist Verbally Other (Comment) (dry scabs on left lower leg) ?  ?  ?  ?    ?     ?     ? ? ?Personal Care Assistance Level of  Assistance  ?Bathing, Feeding, Dressing Bathing Assistance: Limited assistance ?Feeding assistance: Independent ?Dressing Assistance: Limited assistance ?   ? ?Functional Limitations Info  ?Sight, Speech, Hearing Sight Info: Adequate ?Hearing Info: Adequate ?Speech Info: Adequate  ? ? ?SPECIAL CARE FACTORS FREQUENCY  ?    ?  ?  ?  ?  ?  ?  ?   ? ? ?Contractures Contractures Info: Not present  ? ? ?Additional Factors Info  ?Code Status, Allergies, Isolation Precautions Code Status Info: Full ?Allergies Info: No known allergies ?  ?  ?Isolation Precautions Info: Contact precautions ?   ? ?Current Medications (09/07/2021):  This is the current hospital active medication list ?Current Facility-Administered Medications  ?Medication Dose Route Frequency Provider Last Rate Last Admin  ? acetaminophen (TYLENOL) tablet 650 mg  650 mg Oral Q6H PRN Marylyn Ishihara, Tyrone A, DO   650 mg at 09/07/21 1005  ? Or  ? acetaminophen (TYLENOL) suppository 650 mg  650 mg Rectal Q6H PRN Marylyn Ishihara, Tyrone A, DO      ? amLODipine (NORVASC) tablet 10 mg  10 mg Oral Daily Kyle, Tyrone A, DO   10 mg at 09/07/21 1610  ? amoxicillin-clavulanate (AUGMENTIN) 875-125 MG per tablet 1 tablet  1 tablet Oral Q12H Charlynne Cousins, MD   1 tablet at 09/07/21 1326  ? azithromycin (ZITHROMAX) tablet 500 mg  500 mg Oral Daily Charlynne Cousins, MD   500 mg at 09/07/21 1326  ? buPROPion (WELLBUTRIN XL) 24 hr tablet 300 mg  300 mg Oral Daily Kyle, Tyrone A, DO   300 mg at 09/07/21 0815  ? docusate sodium (COLACE) capsule 100 mg  100 mg Oral BID Marylyn Ishihara, Tyrone A, DO   100 mg at 09/07/21 0815  ? DULoxetine (CYMBALTA) DR capsule 60 mg  60 mg Oral Daily Kyle, Tyrone A, DO   60 mg at 09/07/21 9604  ? ferrous sulfate tablet 325 mg  325 mg Oral Q breakfast Cherylann Ratel A, DO   325 mg at 09/07/21 5409  ? furosemide (LASIX) tablet 20 mg  20 mg Oral Q T,Th,S,Su Charlynne Cousins, MD   20 mg at 09/07/21 8119  ? gabapentin (NEURONTIN) capsule 300 mg  300 mg Oral TID Cherylann Ratel A, DO   300 mg at 09/07/21 0810  ? guaiFENesin (MUCINEX) 12 hr tablet 600 mg  600 mg Oral BID Marylyn Ishihara, Tyrone A, DO   600 mg at 09/07/21 0815  ? heparin injection 5,000 Units  5,000 Units Subcutaneous Q8H Kyle, Tyrone A, DO   5,000 Units at 09/07/21 1478  ? hydrOXYzine (ATARAX) tablet 25 mg  25 mg Oral Q6H PRN Cherylann Ratel A, DO   25 mg at 09/05/21 2225  ? insulin aspart (novoLOG) injection 0-15 Units  0-15 Units Subcutaneous TID WC Charlynne Cousins, MD   2 Units at 09/07/21 1324  ? insulin aspart (novoLOG) injection 0-5 Units  0-5 Units Subcutaneous QHS Charlynne Cousins, MD      ? insulin aspart (novoLOG) injection 6 Units  6 Units Subcutaneous TID WC Charlynne Cousins,  MD   6 Units at 09/07/21 1325  ? insulin detemir (LEVEMIR) injection 5 Units  5 Units Subcutaneous BID Charlynne Cousins, MD   5 Units at 09/07/21 1006  ? ipratropium-albuterol (DUONEB) 0.5-2.5 (3) MG/3ML nebulizer solution 3 mL  3 mL Nebulization Q6H PRN Marylyn Ishihara, Tyrone A, DO      ? loratadine (CLARITIN) tablet 10 mg  10 mg Oral Daily Kyle, Tyrone A, DO   10 mg at 09/07/21 2952  ? magnesium hydroxide (MILK OF MAGNESIA) suspension 30 mL  30 mL Oral Daily PRN Cherylann Ratel A, DO      ? metoprolol tartrate (LOPRESSOR) tablet 12.5 mg  12.5 mg Oral BID Marylyn Ishihara, Tyrone A, DO   12.5 mg at 09/07/21 8413  ? nicotine (NICODERM CQ - dosed in mg/24 hours) patch 21 mg  21 mg Transdermal Daily Kyle, Tyrone A, DO   21 mg at 09/07/21 0817  ? ondansetron (ZOFRAN) tablet 4 mg  4 mg Oral Q6H PRN Marylyn Ishihara, Tyrone A, DO   4 mg at 09/06/21 1247  ? Or  ? ondansetron (ZOFRAN) injection 4 mg  4 mg Intravenous Q6H PRN Marylyn Ishihara, Tyrone A, DO      ? oxybutynin (DITROPAN-XL) 24 hr tablet 10 mg  10 mg Oral Daily Kyle, Tyrone A, DO   10 mg at 09/07/21 2440  ? oxyCODONE (Oxy IR/ROXICODONE) immediate release tablet 30 mg  30 mg Oral Q8H Charlynne Cousins, MD   30 mg at 09/07/21 0630  ? oxyCODONE (Oxy IR/ROXICODONE) immediate release tablet 5 mg  5 mg Oral Q8H PRN Charlynne Cousins, MD   5 mg at 09/07/21 1027  ? pantoprazole (PROTONIX) EC tablet 40 mg  40 mg Oral Daily Charlynne Cousins, MD   40 mg at 09/07/21 0810  ? polyethylene glycol (MIRALAX / GLYCOLAX) packet 17 g  1

## 2021-09-07 NOTE — Evaluation (Signed)
Physical Therapy Evaluation ?Patient Details ?Name: Gregory [name] ?MRN: [id number] ?DOB: 10/17/1960 ?Today's Date: 09/07/2021 ? ?History of Present Illness ? Pt is a 61 y.o. male who presented to ED with shortness of breath, nausea, and productive cough and a mild fever. Admitted for multifocal pneumonia. PMH significant for R AKA, essential hypertension, COPD, diabetes mellitus type 2, chronic diastolic heart failure, chronic kidney disease ?  ?Clinical Impression ? Pt is a 61 y.o. male with above HPI. Pt is modified independent with transfers and ADLs at baseline at long term care facility. Pt is currently modified independent with stand pivot transfers to chair and reports that he feels he has no PT needs during hospital stay. No further skilled PT needs identified at this time. PT will sign off. Please reconsult if mobility status changes.   ?   ?   ? ?Recommendations for follow up therapy are one component of a multi-disciplinary discharge planning process, led by the attending physician.  Recommendations may be updated based on patient status, additional functional criteria and insurance authorization. ? ?Follow Up Recommendations No PT follow up ? ?  ?Assistance Recommended at Discharge Set up Supervision/Assistance  ?Patient can return home with the following ?   ? ?  ?Equipment Recommendations None recommended by PT  ?Recommendations for Other Services ?    ?  ?Functional Status Assessment Patient has had a recent decline in their functional status and demonstrates the ability to make significant improvements in function in a reasonable and predictable amount of time.  ? ?  ?Precautions / Restrictions Precautions ?Precautions: Fall ?Precaution Comments: history of R AKA ?Restrictions ?Weight Bearing Restrictions: No  ? ?  ? ?Mobility ? Bed Mobility ?Overal bed mobility: Modified Independent ?  ?  ?  ?  ?  ?  ?General bed mobility comments: HOB elevated, slight use of bed rail ?  ? ?Transfers ?Overall transfer  level: Modified independent ?Equipment used: None ?  ?  ?  ?  ?  ?  ?  ?General transfer comment: able to transfer to recliner with stand pivot and use of B UEs on armrest, able to direct care/set up to therapist to simulate position of his scooter at home. ?  ? ?Ambulation/Gait ?  ?  ?  ?  ?  ?  ?  ?  ? ?Stairs ?  ?  ?  ?  ?  ? ?Wheelchair Mobility ?  ? ?Modified Rankin (Stroke Patients Only) ?  ? ?  ? ?Balance   ?  ?  ?  ?  ?  ?  ?  ?  ?  ?  ?  ?  ?  ?  ?  ?  ?  ?  ?   ? ? ? ?Pertinent Vitals/Pain Pain Assessment ?Pain Assessment: No/denies pain  ? ? ?Home Living Family/patient expects to be discharged to:: Skilled nursing facility ?  ?  ?  ?  ?  ?  ?  ?  ?  ?Additional Comments: pt resides at long term care facility.  ?  ?Prior Function Prior Level of Function : Independent/Modified Independent ?  ?  ?  ?  ?  ?  ?Mobility Comments: able to perform stand pivot transfer to his electric scooter or manual wheelchair independently at baseline. ?ADLs Comments: At facility is able to independently transfer to toilet/ shower using a squat/stand pivot. Has tub bench for bathing. Able to complete basic ADL at baseline. ?  ? ? ?Hand Dominance  ?  Dominant Hand: Right ? ?  ?Extremity/Trunk Assessment  ? Upper Extremity Assessment ?Upper Extremity Assessment: Overall WFL for tasks assessed ?  ? ?Lower Extremity Assessment ?Lower Extremity Assessment: Overall WFL for tasks assessed ?  ? ?Cervical / Trunk Assessment ?Cervical / Trunk Assessment: Normal  ?Communication  ? Communication: No difficulties  ?Cognition Arousal/Alertness: Awake/alert ?Behavior During Therapy: [facility] for tasks assessed/performed ?Overall Cognitive Status: Within Functional Limits for tasks assessed ?  ?  ?  ?  ?  ?  ?  ?  ?  ?  ?  ?  ?  ?  ?  ?  ?  ?  ?  ? ?  ?General Comments   ? ?  ?Exercises    ? ?Assessment/Plan  ?  ?PT Assessment Patient does not need any further PT services  ?PT Problem List   ? ?   ?  ?PT Treatment Interventions     ? ?PT Goals  (Current goals can be found in the Care Plan section)  ?Acute Rehab PT Goals ?Patient Stated Goal: Get back home ?PT Goal Formulation: With patient/family ?Time For Goal Achievement: 09/21/21 ?Potential to Achieve Goals: Good ? ?  ?Frequency   ?  ? ? ?Co-evaluation   ?  ?  ?  ?  ? ? ?  ?AM-PAC PT "6 Clicks" Mobility  ?Outcome Measure Help needed turning from your back to your side while in a flat bed without using bedrails?: None ?Help needed moving from lying on your back to sitting on the side of a flat bed without using bedrails?: None ?Help needed moving to and from a bed to a chair (including a wheelchair)?: None ?Help needed standing up from a chair using your arms (e.g., wheelchair or bedside chair)?: None ?Help needed to walk in hospital room?: Total (R AKA pt uses electric scooter at baseline) ?Help needed climbing 3-5 steps with a railing? : Total (R AKA) ?6 Click Score: 18 ? ?  ?End of Session   ?Activity Tolerance: Patient tolerated treatment well ?Patient left: in chair;with call bell/phone within reach;with family/visitor present ?Nurse Communication: Mobility status ?PT Visit Diagnosis: Other abnormalities of gait and mobility (R26.89);Muscle weakness (generalized) (M62.81) ?  ? ?Time: 0240-9735 ?PT Time Calculation (min) (ACUTE ONLY): 16 min ? ? ?Charges:   PT Evaluation ?$PT Eval Low Complexity: 1 Low ?  ?  ?   ? ? ?Festus Barren., PT, DPT  ?Acute Rehabilitation Services  ?Office [phone] ? ?09/07/2021, 3:45 PM ? ?

## 2021-09-07 NOTE — Progress Notes (Addendum)
[facility] ?PROGRESS NOTE ? ? ? ?Progress Note  ?Gregory [name]  POE:[id number] DOB: [birth year] DOA: 09/05/2021 ?PCP: [name], MD  ? ? ? ?Brief Narrative:  ? ?Gregory [name] is an 61 y.o. male past medical history significant for essential hypertension, COPD, diabetes mellitus type 2, chronic diastolic heart failure, chronic kidney disease stage IIIb comes in with shortness of breath that started the day prior to admission with nausea and productive cough and a mild fever, chest x-ray showed bilateral patchy infiltrates. ? ? ? ?Assessment/Plan:  ? ?Acute respiratory failure with hypoxia secondarily to Multifocal pneumonia ?Now requiring 1 L of oxygen try to keep saturations above 92%. ?Anything above 88% and image good. ?We will de-escalate his antibiotics to oral Augmentin and azithromycin. ?MRSA was detected, strep pneumo antigen is negative. ?Monitor on oral antibiotics for 24 hours she has defervesced leukocytosis improved. ? ?Mild hyponatremia: ?Resolved likely due to infectious etiology. ? ?COPD: ?Not on oxygen at home continue inhalers and as needed inhalers. ? ?Diabetes mellitus type 2 with hyperglycemia: ?Continue long-acting Szymon plus sliding scale blood glucose fairly controlled. ?A1c was 7.5. ? ?Chronic diastolic heart failure: ?Appears euvolemic,continue Lasix and metoprolol. ? ?Essential hypertension: ?Continue metoprolol and Lasix. ? ?Dyslipidemia: ?Continue statins. ? ?Chronic lymphedema of the left lower extremities: ?Continue current home regimen. ? ?Chronic kidney disease stage IIIb: ?His creatinine appears to be at baseline. ? ?Chronic pain/narcotic seeking behavior: ?Went to see the patient started complaining of pain all over for the last 5 year uncontrollable. ?I did told him we had a conversation about this yesterday that we were negative changes narcotics is voice tone and attitude change and he requested that I leave the room for ? ?Tobacco abuse: ?Nicotine  patch. ? ?Anxiety/depression: ?Continue current home regimen. ? ?BPH: ?Continue Flomax. ? ?Anemia of chronic renal disease ?Hemoglobin has remained relatively stable. ?No signs of overt bleeding. ?Baseline hemoglobin ranges anywhere from 7.9-9.2. ? ?Sacral decubitus ulcer present on admission stage I ?RN Pressure Injury Documentation: ?Pressure Injury Buttocks Right Deep Tissue Pressure Injury - Purple or maroon localized area of discolored intact skin or blood-filled blister due to damage of underlying soft tissue from pressure and/or shear. (Active)  ?   ?Location: Buttocks  ?Location Orientation: Right  ?Staging: Deep Tissue Pressure Injury - Purple or maroon localized area of discolored intact skin or blood-filled blister due to damage of underlying soft tissue from pressure and/or shear.  ?Wound Description (Comments):   ?Present on Admission: Yes  ?Dressing Type Foam - Lift dressing to assess site every shift 09/06/21 2350  ? ? ?Estimated body mass index is 34.09 kg/m? as calculated from the following: ?  Height as of this encounter: 6' (1.829 m). ?  Weight as of this encounter: 114 kg. ? ? ? ?DVT prophylaxis: lovenox ?Family Communication:none ?Status is: Inpatient ?Remains inpatient appropriate because: Acute respiratory failure with hypoxia due to community-acquired pneumonia ? ? ? ?Code Status:  ? ?  ?Code Status Orders  ?(From admission, onward)  ?  ? ? ?  ? ?  Start     Ordered  ? 09/05/21 0804  Full code  Continuous       ? 09/05/21 0803  ? ?  ?  ? ?  ? ?Code Status History   ? ? Date Active Date Inactive Code Status Order ID Comments User Context  ? 06/26/2021 0351 07/07/2021 2033 Full Code [id number]  Estill Cotta, NP ED  ? 07/11/2019 1312 07/18/2019 1807 Full  Code [id number]  Truett Mainland, DO ED  ? 06/22/2019 1730 06/27/2019 1816 Full Code [id number]  Lequita Halt, MD ED  ? 05/23/2019 0543 05/31/2019 1946 Full Code [id number]  Shela Leff, MD ED  ? 12/05/2018 1756 12/10/2018 0057 Full Code [id number]   Rayburn, Neta Mends, [name] Inpatient  ? 11/16/2018 1809 11/22/2018 1904 Full Code [id number]  Sherene Sires, DO ED  ? 10/22/2018 1546 10/24/2018 1610 Full Code [id number]  [name], MD Inpatient  ? 06/03/2018 2131 06/09/2018 1546 Full Code [id number]  Bonnielee Haff, MD Inpatient  ? 03/25/2017 1724 04/02/2017 1951 Full Code [id number]  Geradine Girt, DO ED  ? 11/07/2015 0631 12/02/2015 1655 Full Code [id number]  Chesley Mires, MD ED  ? ?  ? ? ? ? ?IV Access:  ? ?Peripheral IV ? ? ?Procedures and diagnostic studies:  ? ?No results found. ? ? ?Medical Consultants:  ? ?None. ? ? ?Subjective:  ? ? ?Gregory [name] relates his breathing is unchanged asking for narcotics was very melodramatic initially when I spoke to him that we cannot increase his narcotic his tone and had to change. ? ?Objective:  ? ? ?Vitals:  ? 09/06/21 2253 09/07/21 3817 09/07/21 7116 09/07/21 0817  ?BP: (!) 170/72 (!) 144/71 (!) 151/69   ?Pulse: 62 (!) 59  (!) 57  ?Resp: 20 18    ?Temp: 97.8 ?F (36.6 ?C) 98 ?F (36.7 ?C)    ?TempSrc: Oral Oral    ?SpO2: 100% 98%    ?Weight:      ?Height:      ? ?SpO2: 98 % ?O2 Flow Rate (L/min): 1 L/min ?FiO2 (%): 28 % ? ? ?Intake/Output Summary (Last 24 hours) at 09/07/2021 1012 ?Last data filed at 09/07/2021 [phone] ?Gross per 24 hour  ?Intake 630.18 ml  ?Output 3600 ml  ?Net -2969.82 ml  ? ? ?Filed Weights  ? 09/06/21 0630  ?Weight: 114 kg  ? ? ?Exam: ?General exam: In no acute distress. ?Respiratory system: Good air movement and clear to auscultation. ?Cardiovascular system: S1 & S2 heard, RRR. No JVD. ?Gastrointestinal system: Abdomen is nondistended, soft and nontender.  ?Extremities: No pedal edema. ?Skin: No rashes, lesions or ulcers ?Psychiatry: Judgement and insight appear normal. Mood & affect appropriate. ? ? ?Data Reviewed:  ? ? ?Labs: ?Basic Metabolic Panel: ?Recent Labs  ?Lab 09/05/21 ?3833 09/06/21 ?0740  ?NA 133* 137  ?K 5.1 4.9  ?CL 103 103  ?CO2 23 26  ?GLUCOSE 268* 132*  ?BUN 34* 33*  ?CREATININE 1.88*  1.88*  ?CALCIUM 8.7* 9.0  ? ? ?GFR ?Estimated Creatinine Clearance: 53.1 mL/min (A) (by C-G formula based on SCr of 1.88 mg/dL (H)). ?Liver Function Tests: ?Recent Labs  ?Lab 09/05/21 ?3832 09/06/21 ?0740  ?AST 14* 15  ?ALT 12 10  ?ALKPHOS 81 67  ?BILITOT 0.3 0.5  ?PROT 7.4 6.7  ?ALBUMIN 3.5 2.9*  ? ? ?No results for input(s): LIPASE, AMYLASE in the last 168 hours. ?No results for input(s): AMMONIA in the last 168 hours. ?Coagulation profile ?No results for input(s): INR, PROTIME in the last 168 hours. ?COVID-19 Labs ? ?No results for input(s): DDIMER, FERRITIN, LDH, CRP in the last 72 hours. ? ?Lab Results  ?Component Value Date  ? [place] NEGATIVE 09/05/2021  ? [place] NEGATIVE 07/06/2021  ? [place] NEGATIVE 06/26/2021  ? [place] NEGATIVE 06/25/2019  ? ? ?CBC: ?Recent Labs  ?Lab 09/05/21 ?9191 09/05/21 ?1105 09/06/21 ?0740  ?WBC 17.5* 17.0* 8.5  ?NEUTROABS 15.6*  --  5.6  ?  HGB 10.2* 10.5* 9.7*  ?HCT 32.7* 34.6* 32.2*  ?MCV 88.4 89.4 89.9  ?PLT 204 217 177  ? ? ?Cardiac Enzymes: ?No results for input(s): CKTOTAL, CKMB, CKMBINDEX, TROPONINI in the last 168 hours. ?BNP (last 3 results) ?No results for input(s): PROBNP in the last 8760 hours. ?CBG: ?Recent Labs  ?Lab 09/06/21 ?8527 09/06/21 ?1156 09/06/21 ?1650 09/06/21 ?2253 09/07/21 ?7824  ?GLUCAP 123* 158* 147* 153* 193*  ? ? ?D-Dimer: ?No results for input(s): DDIMER in the last 72 hours. ?Hgb A1c: ?Recent Labs  ?  09/06/21 ?0740  ?HGBA1C 7.5*  ? ?Lipid Profile: ?No results for input(s): CHOL, HDL, LDLCALC, TRIG, CHOLHDL, LDLDIRECT in the last 72 hours. ?Thyroid function studies: ?No results for input(s): TSH, T4TOTAL, T3FREE, THYROIDAB in the last 72 hours. ? ?Invalid input(s): FREET3 ?Anemia work up: ?No results for input(s): VITAMINB12, FOLATE, FERRITIN, TIBC, IRON, RETICCTPCT in the last 72 hours. ?Sepsis Labs: ?Recent Labs  ?Lab 09/05/21 ?2353 09/05/21 ?6144 09/05/21 ?1105 09/06/21 ?0740  ?WBC 17.5*  --  17.0* 8.5  ?LATICACIDVEN 1.8 0.9  --    --   ? ? ?Microbiology ?Recent Results (from the past 240 hour(s))  ?Blood culture (routine x 2)     Status: None (Preliminary result)  ? Collection Time: 09/05/21  5:37 AM  ? Specimen: BLOOD  ?Result Value Ref Range Sta

## 2021-09-08 DIAGNOSIS — J9601 Acute respiratory failure with hypoxia: Secondary | ICD-10-CM | POA: Diagnosis not present

## 2021-09-08 DIAGNOSIS — J189 Pneumonia, unspecified organism: Secondary | ICD-10-CM | POA: Diagnosis not present

## 2021-09-08 DIAGNOSIS — I5032 Chronic diastolic (congestive) heart failure: Secondary | ICD-10-CM | POA: Diagnosis not present

## 2021-09-08 DIAGNOSIS — I890 Lymphedema, not elsewhere classified: Secondary | ICD-10-CM

## 2021-09-08 DIAGNOSIS — E1169 Type 2 diabetes mellitus with other specified complication: Secondary | ICD-10-CM | POA: Diagnosis not present

## 2021-09-08 LAB — BASIC METABOLIC PANEL
Anion gap: 8 (ref 5–15)
BUN: 28 mg/dL — ABNORMAL HIGH (ref 8–23)
CO2: 24 mmol/L (ref 22–32)
Calcium: 8.8 mg/dL — ABNORMAL LOW (ref 8.9–10.3)
Chloride: 103 mmol/L (ref 98–111)
Creatinine, Ser: 1.6 mg/dL — ABNORMAL HIGH (ref 0.61–1.24)
GFR, Estimated: 48 mL/min — ABNORMAL LOW (ref >60)
Glucose, Bld: 161 mg/dL — ABNORMAL HIGH (ref 70–99)
Potassium: 4.2 mmol/L (ref 3.5–5.1)
Sodium: 135 mmol/L (ref 135–145)

## 2021-09-08 LAB — GLUCOSE, CAPILLARY
Glucose-Capillary: 130 mg/dL — ABNORMAL HIGH (ref 70–99)
Glucose-Capillary: 167 mg/dL — ABNORMAL HIGH (ref 70–99)

## 2021-09-08 MED ORDER — OXYCODONE HCL 5 MG PO TABS
5.0000 mg | ORAL_TABLET | Freq: Three times a day (TID) | ORAL | 0 refills | Status: DC | PRN
Start: 2021-09-08 — End: 2022-08-27

## 2021-09-08 MED ORDER — AZITHROMYCIN 250 MG PO TABS
ORAL_TABLET | ORAL | 0 refills | Status: DC
Start: 2021-09-08 — End: 2022-01-31

## 2021-09-08 MED ORDER — AMOXICILLIN-POT CLAVULANATE 875-125 MG PO TABS
1.0000 | ORAL_TABLET | Freq: Two times a day (BID) | ORAL | 0 refills | Status: AC
Start: 2021-09-08 — End: 2021-09-11

## 2021-09-08 MED ORDER — OXYCODONE HCL 30 MG PO TABS: 30.0000 mg | ORAL_TABLET | Freq: Three times a day (TID) | ORAL | 0 refills | Status: DC

## 2021-09-08 NOTE — Progress Notes (Signed)
Received report. Assuming care of patient.  

## 2021-09-08 NOTE — TOC Transition Note (Signed)
Transition of Care (TOC) - CM/SW Discharge Note ? ? ?Patient Details  ?Name: Gregory [name] ?MRN: [id number] ?Date of Birth: Oct 20, 1960 ? ?Transition of Care (TOC) CM/SW Contact:  ?Ross Ludwig, LCSW ?Phone Number: ?09/08/2021, 12:19 PM ? ? ?Clinical Narrative:    ?CSW spoke to [place] at [facility] ([place]), patient is a LTC resident and can return today.  Patient to be d/c'ed today to [facility] ([facility]).  Patient and family agreeable to plans will transport via ems RN to call report [phone].  CSW signing off, please reconsult for other social work needs. ? ? ? ? ?Final next level of care: [place] ?Barriers to Discharge: Barriers Resolved ? ? ?Patient Goals and CMS Choice ?Patient states their goals for this hospitalization and ongoing recovery are:: To return back to [facility] ([facility]) ?CMS Medicare.gov Compare Post Acute Care list provided to:: Patient ?Choice offered to / list presented to : Patient ? ?Discharge Placement ?  ?Existing PASRR number confirmed : 09/06/21          ?Patient chooses bed at: Other - please specify in the comment section below: ([facility] ([facility])) ?Patient to be transferred to facility by: [facility] ?Name of family member notified: Patient to notify family. ?Patient and family notified of of transfer: 09/08/21 ? ?Discharge Plan and Services ?  ? Back to SNF where he is LTC. ?           ?  ?  ?  ?  ?  ?  ?  ?  ?  ?  ? ?Social Determinants of Health (SDOH) Interventions ?  ? ? ?Readmission Risk Interventions ? ?  07/03/2021  ?  2:54 PM 06/23/2019  ? 10:33 AM  ?Readmission Risk Prevention Plan  ?Transportation Screening Complete Complete  ?PCP or Specialist Appt within 3-5 Days  Not Complete  ?Not Complete comments  SNF pt  ?[place] or Home Care Consult  Complete  ?Social Work Consult for [place] Planning/Counseling  Complete  ?Palliative Care Screening  Not Applicable  ?Medication Review (RN Care Manager) Referral to Pharmacy Referral to Pharmacy   ?PCP or Specialist appointment within 3-5 days of discharge Complete Not Complete  ?PCP/Specialist Appt Not Complete comments  SNF pt  ?[place] or Home Care Consult Complete Complete  ?SW Recovery Care/Counseling Consult Complete Complete  ?Palliative Care Screening Not Applicable Not Applicable  ?Skilled Nursing Facility Complete Complete  ? ? ? ? ? ?

## 2021-09-08 NOTE — Discharge Summary (Signed)
Physician Discharge Summary  ?RAMEY [name] OHY:[id number] DOB: 1961-05-27 DOA: 09/05/2021 ? ?PCP: [name], MD ? ?Admit date: 09/05/2021 ?Discharge date: 09/08/2021 ? ?Admitted From: SNF ?Disposition:  SNF ? ?Recommendations for Outpatient Follow-up:  ?Follow up with PCP in 1-2 weeks ? ? ?Home Health:No ?Equipment/Devices:None ? ?Discharge Condition:Stable ?CODE STATUS:Full ?Diet recommendation: Heart Healthy ? ?Brief/Interim Summary: ?61 y.o. male past medical history significant for essential hypertension, COPD, diabetes mellitus type 2, chronic diastolic heart failure, chronic kidney disease stage IIIb comes in with shortness of breath that started the day prior to admission with nausea and productive cough and a mild fever, chest x-ray showed bilateral patchy infiltrates. ? ?Discharge Diagnoses:  ?Principal Problem: ?  Multifocal pneumonia ?Active Problems: ?  Acute respiratory failure with hypoxia ([place]) ?  COPD (chronic obstructive pulmonary disease) ([place]) ?  Chronic diastolic CHF (congestive heart failure) ([place]) ?  HTN (hypertension) ?  HLD (hyperlipidemia) ?  Type II diabetes mellitus with neurological manifestations ([place]) ?  GERD without esophagitis ?  Dyslipidemia associated with type 2 diabetes mellitus ([place]) ?  Chronic pain ?  Depression ?  Lymphedema of left leg ?  Stage 3b chronic kidney disease (CKD) ([place]) ?  Tobacco use ?  Normocytic anemia ?  Hyponatremia ?  Anxiety ?  BPH (benign prostatic hyperplasia) ? ?Acute respiratory failure with hypoxia secondary to multifocal pneumonia: ?He was admitted to the hospital placed on oxygen started empirically on Rocephin and azithromycin. ?He defervesced he was weaned to room air. ?He antibiotic regimen was de-escalated to Augmentin and azithromycin which she will continue for 3 more days after discharge. ? ?Mild hyponatremia: ?Likely due to hypovolemia resolved with fluid resuscitation. ? ?COPD: ?Noted not on oxygen at home continue inhalers no changes  were made. ? ?Diabetes mellitus type 2 with hyperglycemia: ?No changes made to his medication continue current regimen. ? ?Chronic diastolic heart failure: ?Continue metoprolol and Lasix no changes made. ? ?Essential hypertension: ?His blood pressure remained well controlled continue current regimen no changes made. ? ?Dyslipidemia: ?Continue statins. ? ?. ?Chronic lymphedema of the left lower extremity: ?Noted. ? ?Chronic kidney disease stage IIIb: ?His creatinine appears to be at baseline. ? ?Chronic pain/narcotic seeking behavior: ?No changes were to his medication continue current regimen. ? ?Tobacco abuse: ?Nicotine patch: ? ?Anemia of chronic renal disease: ?Hemoglobin remained stable. ? ?BPH: ?Continue Flomax. ? ?Sacral decubitus ulcer present on admission stage I: ?Turn patient every 2 hours. ? ? ? ? ?Discharge Instructions ? ?Discharge Instructions   ? ? Diet - low sodium heart healthy   Complete by: As directed ?  ? Increase activity slowly   Complete by: As directed ?  ? No wound care   Complete by: As directed ?  ? ?  ? ?Allergies as of 09/08/2021   ?No Known Allergies ?  ? ?  ?Medication List  ?  ? ?TAKE these medications   ? ?acetaminophen 325 MG tablet ?Commonly known as: TYLENOL ?Take 650 mg by mouth every 4 (four) hours as needed for moderate pain or mild pain (general discomfort). ?  ?amLODipine 10 MG tablet ?Commonly known as: NORVASC ?Take 10 mg by mouth daily. ?  ?amoxicillin-clavulanate 875-125 MG tablet ?Commonly known as: AUGMENTIN ?Take 1 tablet by mouth every 12 (twelve) hours for 3 days. ?  ?azelastine 0.1 % nasal spray ?Commonly known as: ASTELIN ?Place 1 spray into both nostrils daily. ?  ?azithromycin 250 MG tablet ?Commonly known as: ZITHROMAX ?Take 1 tablet daily ?  ?  Biofreeze 4 % Gel ?Generic drug: Menthol (Topical Analgesic) ?Apply 1 application topically every 8 (eight) hours as needed (pain to right leg/stump). ?  ?buPROPion 300 MG 24 hr tablet ?Commonly known as: WELLBUTRIN  XL ?Take 300 mg by mouth daily. ?  ?cetirizine 10 MG tablet ?Commonly known as: ZYRTEC ?Take 10 mg by mouth daily. ?  ?diclofenac Sodium 1 % Gel ?Commonly known as: VOLTAREN ?Apply 4 g topically every 8 (eight) hours as needed (right leg pain). ?  ?docusate sodium 100 MG capsule ?Commonly known as: COLACE ?Take 1 capsule (100 mg total) by mouth 2 (two) times daily. ?  ?DULoxetine 60 MG capsule ?Commonly known as: CYMBALTA ?Take 60 mg by mouth daily. ?  ?Eszopiclone 3 MG Tabs ?Take 3 mg by mouth at bedtime. ?  ?ferrous sulfate 325 (65 FE) MG tablet ?Take 1 tablet (325 mg total) by mouth daily with breakfast. ?  ?fluticasone 50 MCG/ACT nasal spray ?Commonly known as: FLONASE ?Place 1 spray into both nostrils daily. ?  ?furosemide 20 MG tablet ?Commonly known as: LASIX ?Take 20 mg by mouth See admin instructions. Give 24m by mouth every Tuesday, Thursday, Saturday, Sunday for leg swelling ?  ?gabapentin 300 MG capsule ?Commonly known as: NEURONTIN ?Take 300 mg by mouth 3 (three) times daily. ?  ?glipiZIDE 10 MG tablet ?Commonly known as: GLUCOTROL ?Take 10 mg by mouth daily before breakfast. ?  ?GLUCAGON HCL IJ ?Inject 1 mg as directed once as needed (hypoglycemia). ?  ?hydrOXYzine 25 MG tablet ?Commonly known as: ATARAX ?Take 25 mg by mouth every 6 (six) hours as needed for itching. ?  ?insulin glargine 100 UNIT/ML injection ?Commonly known as: LANTUS ?Inject 12 Units into the skin 2 (two) times daily. ?  ?insulin lispro 100 UNIT/ML KwikPen ?Commonly known as: HUMALOG ?Inject 0-10 Units into the skin See admin instructions. Per sliding scale before meals and at bedtime. ?<70 or >400 = call md ?151-200 = 2 units ?201-50 = 4 units ?251-300 = 6 units ?301-350 =  8 units ?351-400 = 10 units ?  ?ipratropium-albuterol 0.5-2.5 (3) MG/3ML Soln ?Commonly known as: DUONEB ?Take 3 mLs by nebulization every 6 (six) hours. ?  ?Lidocaine 4 % Ptch ?Apply 1 patch topically in the morning and at bedtime. ?  ?magnesium oxide 400 MG  tablet ?Commonly known as: MAG-OX ?Take 400 mg by mouth 2 (two) times daily. ?  ?metoprolol tartrate 25 MG tablet ?Commonly known as: LOPRESSOR ?Take 0.5 tablets (12.5 mg total) by mouth 2 (two) times daily. ?  ?Narcan 4 MG/0.1ML Liqd nasal spray kit ?Generic drug: naloxone ?Place 1 spray into the nose as needed (poorly responsive or turning blue). ?  ?omeprazole 20 MG capsule ?Commonly known as: PRILOSEC ?Take 20 mg by mouth at bedtime. ?  ?ondansetron 4 MG tablet ?Commonly known as: ZOFRAN ?Take 4 mg by mouth every 8 (eight) hours as needed for nausea. ?  ?OVER THE COUNTER MEDICATION ?Take 30 mLs by mouth in the morning and at bedtime. Pro Heal ?  ?oxybutynin 10 MG 24 hr tablet ?Commonly known as: DITROPAN-XL ?Take 10 mg by mouth daily. ?  ?oxyCODONE 5 MG immediate release tablet ?Commonly known as: Oxy IR/ROXICODONE ?Take 1 tablet (5 mg total) by mouth every 8 (eight) hours as needed for severe pain (pain). ?  ?oxycodone 30 MG immediate release tablet ?Commonly known as: ROXICODONE ?Take 1 tablet (30 mg total) by mouth every 8 (eight) hours. ?  ?phenazopyridine 100 MG tablet ?Commonly known as: PYRIDIUM ?  Take 100 mg by mouth every 8 (eight) hours as needed for pain (urinary pain). ?  ?polyethylene glycol 17 g packet ?Commonly known as: MIRALAX / GLYCOLAX ?Take 17 g by mouth daily. ?  ?pregabalin 150 MG capsule ?Commonly known as: LYRICA ?Take 1 capsule (150 mg total) by mouth 2 (two) times daily. ?  ?rOPINIRole 2 MG tablet ?Commonly known as: REQUIP ?Take 1 mg by mouth in the morning, at noon, and at bedtime. ?  ?rosuvastatin 10 MG tablet ?Commonly known as: CRESTOR ?Take 10 mg by mouth at bedtime. ?  ?senna-docusate 8.6-50 MG tablet ?Commonly known as: Senokot-S ?Take 1 tablet by mouth at bedtime. ?  ?tamsulosin 0.4 MG Caps capsule ?Commonly known as: FLOMAX ?Take 0.4 mg by mouth every evening. ?  ?Tiotropium Bromide Monohydrate 1.25 MCG/ACT Aers ?Inhale 2 puffs into the lungs daily. ?  ?tiZANidine 4 MG  tablet ?Commonly known as: ZANAFLEX ?Take 4 mg by mouth every 6 (six) hours as needed for muscle spasms. ?  ?triamcinolone cream 0.1 % ?Commonly known as: KENALOG ?Apply 1 application topically 2 (two) times daily. [place]ef

## 2021-09-10 LAB — CULTURE, BLOOD (ROUTINE X 2)
Culture: NO GROWTH
Culture: NO GROWTH
Special Requests: ADEQUATE
Special Requests: ADEQUATE

## 2022-01-24 ENCOUNTER — Emergency Department (HOSPITAL_COMMUNITY): Payer: Medicaid Other

## 2022-01-24 ENCOUNTER — Other Ambulatory Visit: Payer: Self-pay

## 2022-01-24 ENCOUNTER — Encounter (HOSPITAL_COMMUNITY): Payer: Self-pay

## 2022-01-24 ENCOUNTER — Inpatient Hospital Stay (HOSPITAL_COMMUNITY): Payer: Medicaid Other

## 2022-01-24 ENCOUNTER — Inpatient Hospital Stay (HOSPITAL_COMMUNITY)
Admission: EM | Admit: 2022-01-24 | Discharge: 2022-01-31 | DRG: 871 | Disposition: A | Discharge status: Skilled Nursing Facility | Payer: Medicaid Other | Source: Skilled Nursing Facility | Attending: Internal Medicine | Admitting: Internal Medicine

## 2022-01-24 DIAGNOSIS — N179 Acute kidney failure, unspecified: Secondary | ICD-10-CM | POA: Diagnosis present

## 2022-01-24 DIAGNOSIS — Z89511 Acquired absence of right leg below knee: Secondary | ICD-10-CM | POA: Diagnosis not present

## 2022-01-24 DIAGNOSIS — M7989 Other specified soft tissue disorders: Secondary | ICD-10-CM | POA: Diagnosis present

## 2022-01-24 DIAGNOSIS — A408 Other streptococcal sepsis: Secondary | ICD-10-CM | POA: Diagnosis present

## 2022-01-24 DIAGNOSIS — W050XXA Fall from non-moving wheelchair, initial encounter: Secondary | ICD-10-CM | POA: Diagnosis present

## 2022-01-24 DIAGNOSIS — N1832 Chronic kidney disease, stage 3b: Secondary | ICD-10-CM | POA: Diagnosis present

## 2022-01-24 DIAGNOSIS — K219 Gastro-esophageal reflux disease without esophagitis: Secondary | ICD-10-CM | POA: Diagnosis present

## 2022-01-24 DIAGNOSIS — H5789 Other specified disorders of eye and adnexa: Secondary | ICD-10-CM | POA: Diagnosis present

## 2022-01-24 DIAGNOSIS — N319 Neuromuscular dysfunction of bladder, unspecified: Secondary | ICD-10-CM | POA: Diagnosis present

## 2022-01-24 DIAGNOSIS — N400 Enlarged prostate without lower urinary tract symptoms: Secondary | ICD-10-CM | POA: Diagnosis present

## 2022-01-24 DIAGNOSIS — B954 Other streptococcus as the cause of diseases classified elsewhere: Secondary | ICD-10-CM | POA: Diagnosis not present

## 2022-01-24 DIAGNOSIS — R6520 Severe sepsis without septic shock: Secondary | ICD-10-CM | POA: Diagnosis present

## 2022-01-24 DIAGNOSIS — G9009 Other idiopathic peripheral autonomic neuropathy: Secondary | ICD-10-CM | POA: Diagnosis present

## 2022-01-24 DIAGNOSIS — Z8619 Personal history of other infectious and parasitic diseases: Secondary | ICD-10-CM

## 2022-01-24 DIAGNOSIS — F329 Major depressive disorder, single episode, unspecified: Secondary | ICD-10-CM | POA: Diagnosis present

## 2022-01-24 DIAGNOSIS — L03213 Periorbital cellulitis: Secondary | ICD-10-CM | POA: Diagnosis present

## 2022-01-24 DIAGNOSIS — Z8249 Family history of ischemic heart disease and other diseases of the circulatory system: Secondary | ICD-10-CM

## 2022-01-24 DIAGNOSIS — Z89412 Acquired absence of left great toe: Secondary | ICD-10-CM | POA: Diagnosis not present

## 2022-01-24 DIAGNOSIS — E1140 Type 2 diabetes mellitus with diabetic neuropathy, unspecified: Secondary | ICD-10-CM | POA: Diagnosis present

## 2022-01-24 DIAGNOSIS — Z89611 Acquired absence of right leg above knee: Secondary | ICD-10-CM

## 2022-01-24 DIAGNOSIS — Z79899 Other long term (current) drug therapy: Secondary | ICD-10-CM

## 2022-01-24 DIAGNOSIS — Z85820 Personal history of malignant melanoma of skin: Secondary | ICD-10-CM

## 2022-01-24 DIAGNOSIS — Z8614 Personal history of Methicillin resistant Staphylococcus aureus infection: Secondary | ICD-10-CM

## 2022-01-24 DIAGNOSIS — I69398 Other sequelae of cerebral infarction: Secondary | ICD-10-CM

## 2022-01-24 DIAGNOSIS — N189 Chronic kidney disease, unspecified: Secondary | ICD-10-CM | POA: Diagnosis not present

## 2022-01-24 DIAGNOSIS — G9341 Metabolic encephalopathy: Secondary | ICD-10-CM | POA: Diagnosis present

## 2022-01-24 DIAGNOSIS — Z8042 Family history of malignant neoplasm of prostate: Secondary | ICD-10-CM

## 2022-01-24 DIAGNOSIS — R7401 Elevation of levels of liver transaminase levels: Secondary | ICD-10-CM | POA: Diagnosis not present

## 2022-01-24 DIAGNOSIS — M549 Dorsalgia, unspecified: Secondary | ICD-10-CM | POA: Diagnosis present

## 2022-01-24 DIAGNOSIS — Z713 Dietary counseling and surveillance: Secondary | ICD-10-CM

## 2022-01-24 DIAGNOSIS — A419 Sepsis, unspecified organism: Secondary | ICD-10-CM | POA: Diagnosis present

## 2022-01-24 DIAGNOSIS — J9601 Acute respiratory failure with hypoxia: Secondary | ICD-10-CM | POA: Diagnosis present

## 2022-01-24 DIAGNOSIS — J690 Pneumonitis due to inhalation of food and vomit: Secondary | ICD-10-CM | POA: Diagnosis not present

## 2022-01-24 DIAGNOSIS — I130 Hypertensive heart and chronic kidney disease with heart failure and stage 1 through stage 4 chronic kidney disease, or unspecified chronic kidney disease: Secondary | ICD-10-CM | POA: Diagnosis present

## 2022-01-24 DIAGNOSIS — E1142 Type 2 diabetes mellitus with diabetic polyneuropathy: Secondary | ICD-10-CM

## 2022-01-24 DIAGNOSIS — I890 Lymphedema, not elsewhere classified: Secondary | ICD-10-CM | POA: Diagnosis present

## 2022-01-24 DIAGNOSIS — D638 Anemia in other chronic diseases classified elsewhere: Secondary | ICD-10-CM | POA: Diagnosis present

## 2022-01-24 DIAGNOSIS — R7881 Bacteremia: Secondary | ICD-10-CM | POA: Diagnosis not present

## 2022-01-24 DIAGNOSIS — D631 Anemia in chronic kidney disease: Secondary | ICD-10-CM | POA: Diagnosis present

## 2022-01-24 DIAGNOSIS — Z20822 Contact with and (suspected) exposure to covid-19: Secondary | ICD-10-CM | POA: Diagnosis present

## 2022-01-24 DIAGNOSIS — Z6834 Body mass index (BMI) 34.0-34.9, adult: Secondary | ICD-10-CM

## 2022-01-24 DIAGNOSIS — E785 Hyperlipidemia, unspecified: Secondary | ICD-10-CM | POA: Diagnosis present

## 2022-01-24 DIAGNOSIS — L03211 Cellulitis of face: Secondary | ICD-10-CM | POA: Diagnosis not present

## 2022-01-24 DIAGNOSIS — I5032 Chronic diastolic (congestive) heart failure: Secondary | ICD-10-CM | POA: Diagnosis present

## 2022-01-24 DIAGNOSIS — L03116 Cellulitis of left lower limb: Secondary | ICD-10-CM | POA: Diagnosis present

## 2022-01-24 DIAGNOSIS — H538 Other visual disturbances: Secondary | ICD-10-CM | POA: Diagnosis present

## 2022-01-24 DIAGNOSIS — Z794 Long term (current) use of insulin: Secondary | ICD-10-CM | POA: Diagnosis not present

## 2022-01-24 DIAGNOSIS — E1151 Type 2 diabetes mellitus with diabetic peripheral angiopathy without gangrene: Secondary | ICD-10-CM | POA: Diagnosis present

## 2022-01-24 DIAGNOSIS — I878 Other specified disorders of veins: Secondary | ICD-10-CM | POA: Diagnosis present

## 2022-01-24 DIAGNOSIS — Z923 Personal history of irradiation: Secondary | ICD-10-CM

## 2022-01-24 DIAGNOSIS — E1122 Type 2 diabetes mellitus with diabetic chronic kidney disease: Secondary | ICD-10-CM | POA: Diagnosis present

## 2022-01-24 DIAGNOSIS — R0609 Other forms of dyspnea: Secondary | ICD-10-CM | POA: Diagnosis not present

## 2022-01-24 DIAGNOSIS — L0390 Cellulitis, unspecified: Secondary | ICD-10-CM | POA: Diagnosis present

## 2022-01-24 DIAGNOSIS — F17210 Nicotine dependence, cigarettes, uncomplicated: Secondary | ICD-10-CM | POA: Diagnosis present

## 2022-01-24 DIAGNOSIS — R509 Fever, unspecified: Principal | ICD-10-CM

## 2022-01-24 DIAGNOSIS — R0981 Nasal congestion: Secondary | ICD-10-CM | POA: Diagnosis present

## 2022-01-24 DIAGNOSIS — Z91199 Patient's noncompliance with other medical treatment and regimen due to unspecified reason: Secondary | ICD-10-CM

## 2022-01-24 DIAGNOSIS — Z993 Dependence on wheelchair: Secondary | ICD-10-CM

## 2022-01-24 DIAGNOSIS — J449 Chronic obstructive pulmonary disease, unspecified: Secondary | ICD-10-CM | POA: Diagnosis present

## 2022-01-24 DIAGNOSIS — Z91040 Latex allergy status: Secondary | ICD-10-CM

## 2022-01-24 DIAGNOSIS — Z85828 Personal history of other malignant neoplasm of skin: Secondary | ICD-10-CM

## 2022-01-24 DIAGNOSIS — R471 Dysarthria and anarthria: Secondary | ICD-10-CM | POA: Diagnosis present

## 2022-01-24 DIAGNOSIS — G894 Chronic pain syndrome: Secondary | ICD-10-CM | POA: Diagnosis present

## 2022-01-24 DIAGNOSIS — B955 Unspecified streptococcus as the cause of diseases classified elsewhere: Secondary | ICD-10-CM | POA: Diagnosis not present

## 2022-01-24 DIAGNOSIS — Z833 Family history of diabetes mellitus: Secondary | ICD-10-CM

## 2022-01-24 LAB — APTT: aPTT: 30 seconds (ref 24–36)

## 2022-01-24 LAB — RAPID URINE DRUG SCREEN, HOSP PERFORMED
Amphetamines: NOT DETECTED
Barbiturates: NOT DETECTED
Benzodiazepines: NOT DETECTED
Cocaine: NOT DETECTED
Opiates: POSITIVE — AB
Tetrahydrocannabinol: POSITIVE — AB

## 2022-01-24 LAB — URINALYSIS, ROUTINE W REFLEX MICROSCOPIC
Bilirubin Urine: NEGATIVE
Glucose, UA: NEGATIVE mg/dL
Ketones, ur: NEGATIVE mg/dL
Leukocytes,Ua: NEGATIVE
Nitrite: NEGATIVE
Protein, ur: 100 mg/dL — AB
Specific Gravity, Urine: 1.01 (ref 1.005–1.030)
pH: 5 (ref 5.0–8.0)

## 2022-01-24 LAB — RESP PANEL BY RT-PCR (FLU A&B, COVID) ARPGX2
Influenza A by PCR: NEGATIVE
Influenza B by PCR: NEGATIVE
SARS Coronavirus 2 by RT PCR: NEGATIVE

## 2022-01-24 LAB — CBC WITH DIFFERENTIAL/PLATELET
Abs Immature Granulocytes: 0.09 10*3/uL — ABNORMAL HIGH (ref 0.00–0.07)
Basophils Absolute: 0.1 10*3/uL (ref 0.0–0.1)
Basophils Relative: 1 %
Eosinophils Absolute: 0.3 10*3/uL (ref 0.0–0.5)
Eosinophils Relative: 2 %
HCT: 36.7 % — ABNORMAL LOW (ref 39.0–52.0)
Hemoglobin: 11.4 g/dL — ABNORMAL LOW (ref 13.0–17.0)
Immature Granulocytes: 1 %
Lymphocytes Relative: 4 %
Lymphs Abs: 0.6 10*3/uL — ABNORMAL LOW (ref 0.7–4.0)
MCH: 27.9 pg (ref 26.0–34.0)
MCHC: 31.1 g/dL (ref 30.0–36.0)
MCV: 90 fL (ref 80.0–100.0)
Monocytes Absolute: 0.5 10*3/uL (ref 0.1–1.0)
Monocytes Relative: 4 %
Neutro Abs: 12.3 10*3/uL — ABNORMAL HIGH (ref 1.7–7.7)
Neutrophils Relative %: 88 %
Platelets: 212 10*3/uL (ref 150–400)
RBC: 4.08 MIL/uL — ABNORMAL LOW (ref 4.22–5.81)
RDW: 15.3 % (ref 11.5–15.5)
WBC: 13.9 10*3/uL — ABNORMAL HIGH (ref 4.0–10.5)
nRBC: 0 % (ref 0.0–0.2)

## 2022-01-24 LAB — BLOOD GAS, ARTERIAL
Acid-base deficit: 1.5 mmol/L (ref 0.0–2.0)
Bicarbonate: 24.3 mmol/L (ref 20.0–28.0)
Drawn by: 331471
O2 Saturation: 95.3 %
Patient temperature: 37
RATE: 1.5 resp/min
pCO2 arterial: 44 mmHg (ref 32–48)
pH, Arterial: 7.35 (ref 7.35–7.45)
pO2, Arterial: 67 mmHg — ABNORMAL LOW (ref 83–108)

## 2022-01-24 LAB — COMPREHENSIVE METABOLIC PANEL
ALT: 12 U/L (ref 0–44)
AST: 15 U/L (ref 15–41)
Albumin: 3.6 g/dL (ref 3.5–5.0)
Alkaline Phosphatase: 76 U/L (ref 38–126)
Anion gap: 9 (ref 5–15)
BUN: 30 mg/dL — ABNORMAL HIGH (ref 8–23)
CO2: 24 mmol/L (ref 22–32)
Calcium: 9.1 mg/dL (ref 8.9–10.3)
Chloride: 104 mmol/L (ref 98–111)
Creatinine, Ser: 2.21 mg/dL — ABNORMAL HIGH (ref 0.61–1.24)
GFR, Estimated: 33 mL/min — ABNORMAL LOW (ref >60)
Glucose, Bld: 164 mg/dL — ABNORMAL HIGH (ref 70–99)
Potassium: 5.1 mmol/L (ref 3.5–5.1)
Sodium: 137 mmol/L (ref 135–145)
Total Bilirubin: 0.6 mg/dL (ref 0.3–1.2)
Total Protein: 7.7 g/dL (ref 6.5–8.1)

## 2022-01-24 LAB — PROTIME-INR
INR: 1 (ref 0.8–1.2)
Prothrombin Time: 12.8 seconds (ref 11.4–15.2)

## 2022-01-24 LAB — GLUCOSE, CAPILLARY
Glucose-Capillary: 74 mg/dL (ref 70–99)
Glucose-Capillary: 83 mg/dL (ref 70–99)

## 2022-01-24 LAB — LACTIC ACID, PLASMA
Lactic Acid, Venous: 1.6 mmol/L (ref 0.5–1.9)
Lactic Acid, Venous: 1 mmol/L (ref 0.5–1.9)
Lactic Acid, Venous: 0.6 mmol/L (ref 0.5–1.9)
Lactic Acid, Venous: 0.6 mmol/L (ref 0.5–1.9)

## 2022-01-24 LAB — MRSA NEXT GEN BY PCR, NASAL: MRSA by PCR Next Gen: DETECTED — AB

## 2022-01-24 LAB — TSH: TSH: 0.844 u[IU]/mL (ref 0.350–4.500)

## 2022-01-24 LAB — TROPONIN I (HIGH SENSITIVITY)
Troponin I (High Sensitivity): 12 ng/L (ref <18)
Troponin I (High Sensitivity): 18 ng/L — ABNORMAL HIGH (ref <18)

## 2022-01-24 LAB — ETHANOL: Alcohol, Ethyl (B): <10 mg/dL (ref <10)

## 2022-01-24 LAB — CK: Total CK: 207 U/L (ref 49–397)

## 2022-01-24 LAB — BRAIN NATRIURETIC PEPTIDE: B Natriuretic Peptide: 69.9 pg/mL (ref 0.0–100.0)

## 2022-01-24 MED ORDER — ORAL CARE MOUTH RINSE
15.0000 mL | OROMUCOSAL | Status: DC | PRN
  Administered 2022-01-24 – 2022-01-25 (×2): 15 mL via OROMUCOSAL

## 2022-01-24 MED ORDER — HEPARIN SODIUM (PORCINE) 5000 UNIT/ML IJ SOLN
5000.0000 [IU] | Freq: Three times a day (TID) | INTRAMUSCULAR | Status: DC
  Administered 2022-01-24 – 2022-01-31 (×20): 5000 [IU] via SUBCUTANEOUS
  Filled 2022-01-24 (×21): qty 1

## 2022-01-24 MED ORDER — METRONIDAZOLE 500 MG/100ML IV SOLN
500.0000 mg | Freq: Once | INTRAVENOUS | Status: AC
  Administered 2022-01-24: 500 mg via INTRAVENOUS
  Filled 2022-01-24: qty 100

## 2022-01-24 MED ORDER — VANCOMYCIN HCL IN DEXTROSE 1-5 GM/200ML-% IV SOLN
1000.0000 mg | INTRAVENOUS | Status: DC
Start: 2022-01-25 — End: 2022-01-26
  Administered 2022-01-25: 1000 mg via INTRAVENOUS
  Filled 2022-01-24: qty 200

## 2022-01-24 MED ORDER — SODIUM CHLORIDE 0.9 % IV SOLN: INTRAVENOUS | Status: DC | PRN

## 2022-01-24 MED ORDER — CHLORHEXIDINE GLUCONATE CLOTH 2 % EX PADS
6.0000 | MEDICATED_PAD | Freq: Every day | CUTANEOUS | Status: DC
  Administered 2022-01-24 – 2022-01-27 (×5): 6 via TOPICAL

## 2022-01-24 MED ORDER — MUPIROCIN 2 % EX OINT
1.0000 | TOPICAL_OINTMENT | Freq: Two times a day (BID) | CUTANEOUS | Status: AC
  Administered 2022-01-24 – 2022-01-29 (×10): 1 via NASAL
  Filled 2022-01-24: qty 22

## 2022-01-24 MED ORDER — LACTATED RINGERS IV SOLN: INTRAVENOUS | Status: DC

## 2022-01-24 MED ORDER — KETOROLAC TROMETHAMINE 15 MG/ML IJ SOLN
15.0000 mg | Freq: Once | INTRAMUSCULAR | Status: AC
  Administered 2022-01-24: 15 mg via INTRAVENOUS
  Filled 2022-01-24: qty 1

## 2022-01-24 MED ORDER — POLYETHYLENE GLYCOL 3350 17 G PO PACK: 17.0000 g | PACK | Freq: Every day | ORAL | Status: DC | PRN

## 2022-01-24 MED ORDER — VANCOMYCIN HCL IN DEXTROSE 1-5 GM/200ML-% IV SOLN: 1000.0000 mg | Freq: Once | INTRAVENOUS | Status: DC

## 2022-01-24 MED ORDER — VANCOMYCIN HCL 2000 MG/400ML IV SOLN
2000.0000 mg | Freq: Once | INTRAVENOUS | Status: AC
Start: 2022-01-24 — End: 2022-01-24
  Administered 2022-01-24: 2000 mg via INTRAVENOUS
  Filled 2022-01-24: qty 400

## 2022-01-24 MED ORDER — SODIUM CHLORIDE 0.9 % IV BOLUS (SEPSIS)
1000.0000 mL | Freq: Once | INTRAVENOUS | Status: AC
  Administered 2022-01-24: 1000 mL via INTRAVENOUS

## 2022-01-24 MED ORDER — SODIUM CHLORIDE 0.9 % IV BOLUS (SEPSIS)
500.0000 mL | Freq: Once | INTRAVENOUS | Status: AC
  Administered 2022-01-24: 500 mL via INTRAVENOUS

## 2022-01-24 MED ORDER — HYDRALAZINE HCL 20 MG/ML IJ SOLN
10.0000 mg | Freq: Four times a day (QID) | INTRAMUSCULAR | Status: DC | PRN
Start: 2022-01-24 — End: 2022-01-31

## 2022-01-24 MED ORDER — ACETAMINOPHEN 325 MG PO TABS
650.0000 mg | ORAL_TABLET | Freq: Four times a day (QID) | ORAL | Status: DC | PRN
  Administered 2022-01-25 – 2022-01-31 (×4): 650 mg via ORAL
  Filled 2022-01-24 (×4): qty 2

## 2022-01-24 MED ORDER — ACETAMINOPHEN 10 MG/ML IV SOLN
1000.0000 mg | Freq: Once | INTRAVENOUS | Status: AC
  Administered 2022-01-24: 1000 mg via INTRAVENOUS
  Filled 2022-01-24: qty 100

## 2022-01-24 MED ORDER — DOCUSATE SODIUM 100 MG PO CAPS: 100.0000 mg | ORAL_CAPSULE | Freq: Two times a day (BID) | ORAL | Status: DC | PRN

## 2022-01-24 MED ORDER — SODIUM CHLORIDE 0.9 % IV SOLN
1.0000 g | Freq: Two times a day (BID) | INTRAVENOUS | Status: DC
  Administered 2022-01-24 – 2022-01-25 (×2): 1 g via INTRAVENOUS
  Filled 2022-01-24 (×2): qty 20

## 2022-01-24 MED ORDER — LABETALOL HCL 5 MG/ML IV SOLN
10.0000 mg | Freq: Four times a day (QID) | INTRAVENOUS | Status: DC | PRN
  Administered 2022-01-25: 10 mg via INTRAVENOUS
  Filled 2022-01-24: qty 4

## 2022-01-24 MED ORDER — CEFEPIME HCL 2 G IV SOLR: 2.0000 g | Freq: Two times a day (BID) | INTRAVENOUS | Status: DC

## 2022-01-24 MED ORDER — SODIUM CHLORIDE 0.9 % IV SOLN
2.0000 g | Freq: Once | INTRAVENOUS | Status: AC
  Administered 2022-01-24: 2 g via INTRAVENOUS
  Filled 2022-01-24: qty 12.5

## 2022-01-24 MED ORDER — ORAL CARE MOUTH RINSE
15.0000 mL | OROMUCOSAL | Status: DC
Start: 2022-01-24 — End: 2022-01-29
  Administered 2022-01-24 – 2022-01-26 (×4): 15 mL via OROMUCOSAL

## 2022-01-24 MED ORDER — LORAZEPAM 2 MG/ML IJ SOLN
0.5000 mg | Freq: Once | INTRAMUSCULAR | Status: AC
Start: 2022-01-24 — End: 2022-01-24
  Administered 2022-01-24: 0.5 mg via INTRAVENOUS
  Filled 2022-01-24: qty 1

## 2022-01-24 MED ORDER — INSULIN ASPART 100 UNIT/ML IJ SOLN
0.0000 [IU] | INTRAMUSCULAR | Status: DC
  Administered 2022-01-25 (×2): 2 [IU] via SUBCUTANEOUS
  Administered 2022-01-25: 5 [IU] via SUBCUTANEOUS
  Administered 2022-01-26 (×2): 2 [IU] via SUBCUTANEOUS
  Filled 2022-01-24: qty 0.15

## 2022-01-24 NOTE — Progress Notes (Addendum)
Pharmacy Antibiotic Note  Gregory [name] is a 61 y.o. male admitted on 01/24/2022 with sepsis.  Pharmacy has been consulted for vancomycin and cefepime dosing.  Plan: Cefepime 2g IV q12h Vancomycin 2g IV given in ED, continue with 1g IV q24h for estimated AUC 487 using SCr 2.21, Vd 0.5 Check vancomycin levels as needed, goal AUC 400-550 Follow up renal function & cultures  Height: 6' (182.9 cm) Weight: 114 kg (251 lb 5.2 oz) IBW/kg (Calculated) : 77.6  Temp (24hrs), Avg:104.6 F (40.3 C), Min:104.5 F (40.3 C), Max:104.6 F (40.3 C)  Recent Labs  Lab 01/24/22 1454 01/24/22 1644  WBC 13.9*  --   CREATININE 2.21*  --   LATICACIDVEN 1.6 1.0    Estimated Creatinine Clearance: 45.2 mL/min (A) (by C-G formula based on SCr of 2.21 mg/dL (H)).    No Known Allergies  Antimicrobials this admission: 8/9 Vanc >> 8/9 Cefepime >> 8/9 Flagyl x 1  Dose adjustments this admission:  Microbiology results: 8/9 BCx: 8/9 UCx  Thank you for allowing pharmacy to be a part of this patient's care.  Peggyann Juba, PharmD, BCPS Pharmacy: [id number] 01/24/2022 5:39 PM  Addendum:  changing cefepime to meropenem with history of Klebsiella ESBL in urine 06/26/21.  Plan: meropenem 1g IV q12h.  Continue to monitor renal function and cultures.  De-escalate as able.  Peggyann Juba, PharmD, BCPS 01/24/2022 6:40 PM

## 2022-01-24 NOTE — ED Provider Notes (Signed)
[facility] DEPT Provider Note   CSN: [id number] Arrival date & time: 01/24/22  1424     History  No chief complaint on file.   Gregory [name] is a 61 y.o. male.  Patient is a 61 year old male with past medical history of CHF, basal cell carcinoma, monic osteomyelitis, PVD, diabetes, hypertension, and hyperlipidemia from according to rehab facility for shortness of breath.  Patient states that he was taken outside in his wheelchair and stayed outside for several hours in the heat.  Patient arrived febrile to 104.6, tachycardic at 111, tachypneic at 31, with a blood pressure 204/68.  Denies any infection, denies any fevers, chills, nausea, vomiting, abdominal pain, dysuria, or coughing.  States he was otherwise in good health this morning.  Patient admits to shortness of breath, palpitations, and is currently writhing around in the bed panicking.  Rehab facility states that patient was outside for unknown amount of time.  States attention was warranted after he fell out of his wheelchair.  Denies blunt head trauma.  The history is provided by the patient. No language interpreter was used.       Home Medications Prior to Admission medications   Medication Sig Start Date End Date Taking? Authorizing Provider  acetaminophen (TYLENOL) 325 MG tablet Take 650 mg by mouth every 4 (four) hours as needed for moderate pain or mild pain (general discomfort).    [provider]  amLODipine (NORVASC) 10 MG tablet Take 10 mg by mouth daily.    [provider]  azelastine (ASTELIN) 0.1 % nasal spray Place 1 spray into both nostrils daily.    [provider]  azithromycin (ZITHROMAX) 250 MG tablet Take 1 tablet daily 09/08/21   Charlynne Cousins, MD  buPROPion (WELLBUTRIN XL) 300 MG 24 hr tablet Take 300 mg by mouth daily.    [provider]  cetirizine (ZYRTEC) 10 MG tablet Take 10 mg by mouth daily. Patient not taking: Reported on  09/06/2021    [provider]  diclofenac Sodium (VOLTAREN) 1 % GEL Apply 4 g topically every 8 (eight) hours as needed (right leg pain).    [provider]  docusate sodium (COLACE) 100 MG capsule Take 1 capsule (100 mg total) by mouth 2 (two) times daily. 05/31/19   Amin, Jeanella Flattery, MD  DULoxetine (CYMBALTA) 60 MG capsule Take 60 mg by mouth daily.    [provider]  Eszopiclone 3 MG TABS Take 3 mg by mouth at bedtime. 09/29/20   [provider]  ferrous sulfate 325 (65 FE) MG tablet Take 1 tablet (325 mg total) by mouth daily with breakfast. 11/23/18   Matilde Haymaker, MD  fluticasone [facility]) 50 MCG/ACT nasal spray Place 1 spray into both nostrils daily.    [provider]  furosemide (LASIX) 20 MG tablet Take 20 mg by mouth See admin instructions. Give 20m by mouth every Tuesday, Thursday, Saturday, Sunday for leg swelling    [provider]  gabapentin (NEURONTIN) 300 MG capsule Take 300 mg by mouth 3 (three) times daily.    [provider]  glipiZIDE (GLUCOTROL) 10 MG tablet Take 10 mg by mouth daily before breakfast.    [provider]  GLUCAGON HCL IJ Inject 1 mg as directed once as needed (hypoglycemia).    [provider]  hydrOXYzine (ATARAX) 25 MG tablet Take 25 mg by mouth every 6 (six) hours as needed for itching.    [provider]  insulin glargine (  LANTUS) 100 UNIT/ML injection Inject 12 Units into the skin 2 (two) times daily.    [provider]  insulin lispro (HUMALOG) 100 UNIT/ML KwikPen Inject 0-10 Units into the skin See admin instructions. Per sliding scale before meals and at bedtime. <70 or >400 = call md 151-200 = 2 units 201-50 = 4 units 251-300 = 6 units 301-350 =  8 units 351-400 = 10 units    [provider]  ipratropium-albuterol (DUONEB) 0.5-2.5 (3) MG/3ML SOLN Take 3 mLs by nebulization every 6 (six) hours.    [provider]  Lidocaine 4 % PTCH  Apply 1 patch topically in the morning and at bedtime.    [provider]  magnesium oxide (MAG-OX) 400 MG tablet Take 400 mg by mouth 2 (two) times daily.    [provider]  Menthol, Topical Analgesic, (BIOFREEZE) 4 % GEL Apply 1 application topically every 8 (eight) hours as needed (pain to right leg/stump).    [provider]  metoprolol tartrate (LOPRESSOR) 25 MG tablet Take 0.5 tablets (12.5 mg total) by mouth 2 (two) times daily. 07/18/19 09/06/22  Elodia Florence., MD  naloxone [facility]) 4 MG/0.1ML LIQD nasal spray kit Place 1 spray into the nose as needed (poorly responsive or turning blue).    [provider]  omeprazole (PRILOSEC) 20 MG capsule Take 20 mg by mouth at bedtime.    [provider]  ondansetron (ZOFRAN) 4 MG tablet Take 4 mg by mouth every 8 (eight) hours as needed for nausea.    [provider]  OVER THE COUNTER MEDICATION Take 30 mLs by mouth in the morning and at bedtime. Pro Heal    [provider]  oxybutynin (DITROPAN-XL) 10 MG 24 hr tablet Take 10 mg by mouth daily.    [provider]  oxyCODONE (OXY IR/ROXICODONE) 5 MG immediate release tablet Take 1 tablet (5 mg total) by mouth every 8 (eight) hours as needed for severe pain (pain). 09/08/21   Charlynne Cousins, MD  oxycodone (ROXICODONE) 30 MG immediate release tablet Take 1 tablet (30 mg total) by mouth every 8 (eight) hours. 09/08/21   Charlynne Cousins, MD  phenazopyridine (PYRIDIUM) 100 MG tablet Take 100 mg by mouth every 8 (eight) hours as needed for pain (urinary pain).    [provider]  polyethylene glycol (MIRALAX / GLYCOLAX) 17 g packet Take 17 g by mouth daily.    [provider]  pregabalin (LYRICA) 150 MG capsule Take 1 capsule (150 mg total) by mouth 2 (two) times daily. 07/07/21   Thurnell Lose, MD  rOPINIRole (REQUIP) 2 MG tablet Take 1 mg by mouth in the morning, at noon, and at bedtime.    [provider]  rosuvastatin (CRESTOR) 10 MG tablet Take 10 mg by mouth at bedtime.    [provider]  senna-docusate (SENOKOT-S) 8.6-50 MG tablet Take 1 tablet by mouth at bedtime.    [provider]  tamsulosin (FLOMAX) 0.4 MG CAPS capsule Take 0.4 mg by mouth every evening.  11/26/13   [provider]  Tiotropium Bromide Monohydrate 1.25 MCG/ACT AERS Inhale 2 puffs into the lungs daily.    [provider]  tiZANidine (ZANAFLEX) 4 MG tablet Take 4 mg by mouth every 6 (six) hours as needed for muscle spasms.    [provider]  triamcinolone cream (KENALOG) 0.1 % Apply 1 application topically 2 (two) times daily. Left lower leg    [provider]  vitamin B-12 (CYANOCOBALAMIN) 500 MCG tablet Take 1,000 mcg by mouth daily.     [provider]      Allergies    Patient has no known allergies.    Review of Systems   Review of Systems  Constitutional:  Positive for fever. Negative for chills.  HENT:  Negative for ear pain and sore throat.   Eyes:  Negative for pain and visual disturbance.  Respiratory:  Positive for shortness of breath. Negative for cough.   Cardiovascular:  Negative for chest pain and palpitations.  Gastrointestinal:  Negative for abdominal pain and vomiting.  Genitourinary:  Negative for dysuria and hematuria.  Musculoskeletal:  Negative for arthralgias and back pain.  Skin:  Negative for color change and rash.  Neurological:  Negative for seizures and syncope.  All other systems reviewed and are negative.   Physical Exam Updated Vital Signs BP (!) 138/117   Pulse (!) 110   Temp (!) 104.6 F (40.3 C) (Rectal)   Resp 14   Ht 6' (1.829 m)   Wt 114 kg   SpO2 93%   BMI 34.09 kg/m  Physical Exam Vitals and nursing note reviewed.  Constitutional:      General: He is not in acute distress.    Appearance: He is well-developed.     Comments: Warm to the touch  HENT:     Head: Normocephalic and  atraumatic.  Eyes:     General: Vision grossly intact.     Conjunctiva/sclera: Conjunctivae normal.   Cardiovascular:     Rate and Rhythm: Regular rhythm. Tachycardia present.     Heart sounds: No murmur heard. Pulmonary:     Effort: Pulmonary effort is normal. Tachypnea present. No respiratory distress.     Breath sounds: Normal breath sounds.  Abdominal:     Palpations: Abdomen is soft.     Tenderness: There is no abdominal tenderness.  Musculoskeletal:        General: No swelling.     Cervical back: Neck supple.     Comments: Lower extremity amputee  Skin:    General: Skin is warm and dry.     Capillary Refill: Capillary refill takes less than 2 seconds.  Neurological:     Mental Status: He is alert and oriented to person, place, and time.     GCS: GCS eye subscore is 4. GCS verbal subscore is 5. GCS motor subscore is 6.  Psychiatric:        Mood and Affect: Mood normal.     ED Results / Procedures / Treatments   Labs (all labs ordered are listed, but only abnormal results are displayed) Labs Reviewed  COMPREHENSIVE METABOLIC PANEL - Abnormal; Notable for the following components:      Result Value   Glucose, Bld 164 (*)    BUN 30 (*)    Creatinine, Ser 2.21 (*)    GFR, Estimated 33 (*)    All other components within normal limits  CBC WITH DIFFERENTIAL/PLATELET - Abnormal; Notable for the following components:   WBC 13.9 (*)    RBC 4.08 (*)    Hemoglobin 11.4 (*)    HCT 36.7 (*)    Neutro Abs 12.3 (*)    Lymphs Abs 0.6 (*)    Abs Immature Granulocytes 0.09 (*)    All other components within normal limits  URINALYSIS, ROUTINE W REFLEX MICROSCOPIC - Abnormal; Notable for the following components:   Hgb urine dipstick SMALL (*)  Protein, ur 100 (*)    Bacteria, UA RARE (*)    All other components within normal limits  RESP PANEL BY RT-PCR (FLU A&B, COVID) ARPGX2  CULTURE, BLOOD (ROUTINE X 2)  CULTURE, BLOOD (ROUTINE X 2)  URINE CULTURE  LACTIC ACID, PLASMA   PROTIME-INR  APTT  LACTIC ACID, PLASMA  CK  TSH  RAPID URINE DRUG SCREEN, HOSP PERFORMED  TROPONIN I (HIGH SENSITIVITY)  TROPONIN I (HIGH SENSITIVITY)    EKG None  Radiology DG Chest Port 1 View  Result Date: 01/24/2022 CLINICAL DATA:  Questionable sepsis - evaluate for abnormality EXAM: PORTABLE CHEST 1 VIEW COMPARISON:  September 05, 2021 FINDINGS: Study is suboptimal due to overlying question warmer blanket. Cardiopericardial silhouette is enlarged, stable. No consolidation or pleural effusion seen. Ill-defined patchy opacities at the left mid and right lower lung zone. The visualized skeletal structures are unremarkable. IMPRESSION: Mild cardiomegaly. Ill-defined opacities at the left mid lung and right lower lung suspicious for infiltrations and are more conspicuous in the present study. Electronically Signed   By: Frazier Richards M.D.   On: 01/24/2022 16:27    Procedures .Critical Care  Performed by: Lianne Cure, DO Authorized by: Lianne Cure, DO   Critical care provider statement:    Critical care time (minutes):  100   Critical care was necessary to treat or prevent imminent or life-threatening deterioration of the following conditions:  Sepsis (Malignant hyperthermia)   Critical care was time spent personally by me on the following activities:  Development of treatment plan with patient or surrogate, discussions with consultants, evaluation of patient's response to treatment, examination of patient, ordering and review of laboratory studies, ordering and review of radiographic studies, ordering and performing treatments and interventions, pulse oximetry, re-evaluation of patient's condition and review of old charts   Care discussed with: admitting provider     Care discussed with comment:  Consult to intensive care     Medications Ordered in ED Medications  lactated ringers infusion ( Intravenous New Bag/Given 01/24/22 1503)  acetaminophen (TYLENOL) tablet 650 mg (has no  administration in time range)  acetaminophen (OFIRMEV) IV 1,000 mg (has no administration in time range)  ceFEPIme (MAXIPIME) 2 g in sodium chloride 0.9 % 100 mL IVPB (has no administration in time range)  metroNIDAZOLE (FLAGYL) IVPB 500 mg (has no administration in time range)  vancomycin (VANCOCIN) IVPB 1000 mg/200 mL premix (has no administration in time range)  sodium chloride 0.9 % bolus 1,000 mL (has no administration in time range)    And  sodium chloride 0.9 % bolus 1,000 mL (has no administration in time range)    And  sodium chloride 0.9 % bolus 1,000 mL (has no administration in time range)    And  sodium chloride 0.9 % bolus 500 mL (has no administration in time range)  sodium chloride 0.9 % bolus 1,000 mL (1,000 mLs Intravenous New Bag/Given 01/24/22 1451)  LORazepam (ATIVAN) injection 0.5 mg (0.5 mg Intravenous Given 01/24/22 1500)  ketorolac (TORADOL) 15 MG/ML injection 15 mg (15 mg Intravenous Given 01/24/22 1653)    ED Course/ Medical Decision Making/ A&P                           Medical Decision Making Amount and/or Complexity of Data Reviewed Labs: ordered. Radiology: ordered. ECG/medicine tests: ordered.  Risk Prescription drug management.   35:5 PM 61 year old male with past medical history of CHF, basal cell carcinoma,  monic osteomyelitis, PVD, diabetes, hypertension, and hyperlipidemia from according to rehab facility for shortness of breath.  Patient reported to be outside for several hours in his wheelchair. Patient arrived febrile to 104.6, tachycardic at 111, tachypneic at 31, with a blood pressure 204/68.  Concerns for hypothermia.  Given 1 L IV fluids, ice applied to groin and axillary region, Ativan given, cooling blanket applied.  Toradol given.  Laboratories concerning for leukocytosis of 13.6.   Denies any recent fevers or infectious signs or symptoms.  X-ray demonstrates no acute process.  Urinalysis demonstrates no urinary tract infection.  No history  of abdominal pain, nausea, vomiting, or diarrhea.  Will withhold abdominal imaging at this time.  Spectrum antibiotics for unknown possible sepsis ordered.  Patient symptoms likely secondary to hyperthermia.  NMS and serotonin syndrome considered.  Patient on long list of home medications.  CPK and UDS pending.  I spoke with ICU physician Dr. Carlis Abbott, and 30 cc/kg fluid bolus which would put patient at a total of 3.4 L.  Patient already received 1 L.  She also recommends IV Tylenol.  No further recommendations at this time will come see patient.        Final Clinical Impression(s) / ED Diagnoses Final diagnoses:  Hyperthermia    Rx / DC Orders ED Discharge Orders     None         Lianne Cure, DO 49/82/64 1704

## 2022-01-24 NOTE — Progress Notes (Signed)
A consult was received from an ED physician for cefepime and vancomycin per pharmacy dosing.  The patient's profile has been reviewed for ht/wt/allergies/indication/available labs.   A one time order has been placed for cefepime 2g IV x1 and vancomycin 2g IV x1.  Weight 114kg, Scr 2.21 (BL appears to be ~1.8-1.9)    Further antibiotics/pharmacy consults should be ordered by admitting physician if indicated.                       Thank you,  Dimple Nanas, PharmD, BCPS 01/24/2022 5:05 PM

## 2022-01-24 NOTE — H&P (Addendum)
NAME:  Gregory [name], MRN:  [id number], DOB:  1961/02/03, LOS: 0 ADMISSION DATE:  01/24/2022, CONSULTATION DATE: 01/24/2022 REFERRING MD:  Dr. Pearline Cables - ED, CHIEF COMPLAINT: Sepsis  History of Present Illness:  HPI obtained from chart review and verbal report as patient is currently too encephalopathic to provide formation  Gregory [name] is a 61 year old male with a past medical history significant for but not limited to congestive heart failure, COPD, chronic kidney disease stage IIIb type 2 diabetes, osteomyelitis now s/p right AKA, hypertension, hyperlipidemia, prior MRSA bacteremia, prior epidural abscess, and depression who presented to the ED from assisted living facility for reports of fever and "not acting right". Some concern for illcit drug use prior to admission.  On ED arrival patient was found to severely febrile with temperature 104.5, heart rate 115, respiratory rate 25, blood pressure 145/108.  Chest x-ray obtained and revealed mild cardiomegaly with ill-defined opacities of the left midlung and right lower lung suspicious for infiltrates.  Pertinent lab work on admission included glucose 164, BUN 30, creatinine 2.21, GFR 33, WBC 13.9.  Given acute concern for sepsis on arrival PCCM consulted for further management and admission  Of note patient was recently admitted and treated at this facility in March 2023 for multifocal pneumonia.  Pertinent  Medical History  Congestive heart failure, COPD, chronic kidney disease stage IIIb, type 2 diabetes, osteomyelitis now s/p right AKA, hypertension, hyperlipidemia, prior MRSA bacteremia, prior epidural abscess, and depression  Significant Hospital Events: Including procedures, antibiotic start and stop dates in addition to other pertinent events   8/9 presented from a assisted living febrile with Tmax 104, met sepsis criteria on admission  Interim History / Subjective:  Encephalopathic Febrile with temperature 104.6  Objective   Blood  pressure (!) 138/117, pulse (!) 110, temperature (!) 104.6 F (40.3 C), temperature source Rectal, resp. rate 14, height 6' (1.829 m), weight 114 kg, SpO2 93 %.       No intake or output data in the 24 hours ending 01/24/22 1654 Filed Weights   01/24/22 1441  Weight: 114 kg    Examination: General: Acute on severely chronically ill middle-aged male lying in bed severely encephalopathic but in no acute distress HEENT: [place]/AT, MM pink/moist, PERRL, swelling and erythema to right orbital region, sclera without signs of acute infection Neuro: Able to state name only, withering in bed, right AKA CV: s1s2 regular rate and rhythm, no murmur, rubs, or gallops,  PULM: Diffuse bilateral rhonchi, on 3 L nasal cannula, mild increased work of breathing GI: soft, bowel sounds hypoactive in all 4 quadrants, non-tender, severely distended Extremities: See image below Skin: no rashes or lesions       Resolved Hospital Problem list     Assessment & Plan:  Severe sepsis -Patient presented febrile with temperature 104.5 and associated tachypnea, tachycardia, and hypertension.  Lab work revealed leukocytosis with acute kidney injury meeting criteria for sepsis -Per chart review patient has had multiple infections including MRSA bacteremia and epidural abscess in the past -Multiple potential sources of infection including cellulitis versus pneumonia P: Admit to ICU Continue supplemental oxygen high concern for further respiratory support, see below Pan cultures prior to antibiotic Continue IV cefepime and vancomycin Aggressive IV hydration MAP< 65  Trend lactic acid Obtain procalcitonin Monitor urine output Capillary refill Antipyretic measures including IV Tylenol  Acute metabolic encephalopathy Right orbital redness and swelling  -Patient resides at local assisted living unsure functional status at baseline P: Maintain neuro protective measures;  goal for eurothermia, euglycemia, eunatermia,  normoxia, and PCO2 goal of 35-40 Nutrition and bowel regiment  Seizure precautions  Aspirations precautions  Check ammonia UDS positive for opioids and TSH Obtain head CT and CT maxiofacial   At risk for inability to protect airway Likely evolving HCAP History of COPD -Chest x-ray with bilateral infiltrates in the setting of sepsis.  So severely encephalopathic with rapid respirations concern for further respiratory decline and eventual need for airway protection P: Continue supplemental oxygen Obtain ABG Can consider trial of BiPAP if severely hypercapnic Empiric antibiotics as above Elevate head of bed Antipyretic measures as above Minimize sedation  Acute Kidney Injury superimposed on CKD stage IIIb -Creatinine on admission 2.21 with GFR 33 appears baseline creatinine ranges between 1.6-1.8 with GFR mid to low 40s P: Follow renal function  Monitor urine output Trend Bmet Avoid nephrotoxins, Ensure adequate renal perfusion  IV hydration  History of chronic diastolic congestive heart failure History of hypertension/hyperlipidemia -Most recent echocardiogram 06/26/2021 revealed EF 50 to 55% with no wall motion abnormality and normal right ventricular systolic function.  P: Continuous telemetry  Obtain BNP Repeat echocardiogram Strict intake and output  Daily weight to assess volume status Daily assessment for need to diurese  Closely monitor renal function and electrolytes  Resume home medications when appropriate   Anemia, likely of chronic disease P: Trend CBC Monitor for signs of bleeding Hemoglobin goal greater than 7 Transfuse per protocol  History of type 2 diabetes P: SSI CBG goal 140-180 CBG checks q4hrs  Chronic left leg and lymphedema -See images  P: Concern for cellulitis  Antibiotics as above   Chronic pain with history of drug-seeking behavior -On chart review patient has demonstrated drug-seeking behaviors with chronic pain syndrome on  multiple admissions.  P: Minimize sedation given poor mentation  Resume home medications when appropriate   Best Practice (right click and "Reselect all SmartList Selections" daily)   Diet/type: NPO DVT prophylaxis: prophylactic heparin  GI prophylaxis: PPI Lines: N/A Foley:  N/A Code Status:  full code Last date of multidisciplinary goals of care discussion: Pending   Labs   CBC: Recent Labs  Lab 01/24/22 1454  WBC 13.9*  NEUTROABS 12.3*  HGB 11.4*  HCT 36.7*  MCV 90.0  PLT 627    Basic Metabolic Panel: Recent Labs  Lab 01/24/22 1454  NA 137  K 5.1  CL 104  CO2 24  GLUCOSE 164*  BUN 30*  CREATININE 2.21*  CALCIUM 9.1   GFR: Estimated Creatinine Clearance: 45.2 mL/min (A) (by C-G formula based on SCr of 2.21 mg/dL (H)). Recent Labs  Lab 01/24/22 1454  WBC 13.9*  LATICACIDVEN 1.6    Liver Function Tests: Recent Labs  Lab 01/24/22 1454  AST 15  ALT 12  ALKPHOS 76  BILITOT 0.6  PROT 7.7  ALBUMIN 3.6   No results for input(s): "LIPASE", "AMYLASE" in the last 168 hours. No results for input(s): "AMMONIA" in the last 168 hours.  ABG    Component Value Date/Time   PHART 7.381 06/26/2021 0203   PCO2ART 41.8 06/26/2021 0203   PO2ART 106 06/26/2021 0203   HCO3 24.8 06/26/2021 0203   TCO2 26 06/26/2021 0203   ACIDBASEDEF 4.0 (H) 11/08/2015 0329   O2SAT 98.0 06/26/2021 0203     Coagulation Profile: Recent Labs  Lab 01/24/22 1454  INR 1.0    Cardiac Enzymes: No results for input(s): "CKTOTAL", "CKMB", "CKMBINDEX", "TROPONINI" in the last 168 hours.  HbA1C: Hemoglobin A1C  Date/Time  Value Ref Range Status  08/14/2016 12:00 AM 5.6  Final  01/18/2016 12:00 AM 5.5  Final   Hgb A1c MFr Bld  Date/Time Value Ref Range Status  09/06/2021 07:40 AM 7.5 (H) 4.8 - 5.6 % Final    Comment:    (NOTE) Pre diabetes:          5.7%-6.4%  Diabetes:              >6.4%  Glycemic control for   <7.0% adults with diabetes   06/26/2021 07:48 AM 7.2 (H)  4.8 - 5.6 % Final    Comment:    (NOTE) Pre diabetes:          5.7%-6.4%  Diabetes:              >6.4%  Glycemic control for   <7.0% adults with diabetes     CBG: No results for input(s): "GLUCAP" in the last 168 hours.  Review of Systems:   Unable to assess   Past Medical History:  He,  has a past medical history of Acquired absence of left great toe [facility]), Acquired absence of right leg below knee [facility]), Acute osteomyelitis of calcaneum, right ([place]) (10/02/2016), Acute respiratory failure ([place]), Amputation stump infection ([place]) (08/12/2018), Anemia, Basal cell carcinoma, eyelid, Cancer ([place]), Candidiasis (08/12/2018), CHF (congestive heart failure) ([place]), Chronic back pain, Chronic kidney disease, Chronic multifocal osteomyelitis ([place]), Chronic pain syndrome, COPD (chronic obstructive pulmonary disease) ([place]), DDD (degenerative disc disease), lumbar, Depression, Diabetes mellitus without complication ([place]), Diabetic ulcer of toe of left foot ([place]) (10/02/2016), Difficulty in walking, not elsewhere classified, 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), Foot drop, bilateral, Foot drop, right (12/27/2015), GERD (gastroesophageal reflux disease), Herpesviral vesicular dermatitis, Hyperlipidemia, Hyperlipidemia, Hypertension, Insomnia, 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   Surgical History:   Past Surgical History:  Procedure Laterality Date   AMPUTATION Left 03/29/2017   Procedure: LEFT GREAT TOE AMPUTATION AT METATARSOPHALANGEAL JOINT;  Surgeon: [name], MD;   Location: [place];  Service: Orthopedics;  Laterality: Left;   AMPUTATION Right 03/29/2017   Procedure: RIGHT BELOW KNEE AMPUTATION;  Surgeon: [name], MD;  Location: [place];  Service: Orthopedics;  Laterality: Right;   AMPUTATION Left 06/06/2018   Procedure: LEFT 2ND TOE AMPUTATION;  Surgeon: [name], MD;  Location: [place];  Service: Orthopedics;  Laterality: Left;   AMPUTATION Right 11/19/2018   Procedure: AMPUTATION ABOVE KNEE;  Surgeon: [name], MD;  Location: [place];  Service: Orthopedics;  Laterality: Right;   ANTERIOR CERVICAL CORPECTOMY N/A 11/25/2015   Procedure: ANTERIOR CERVICAL FIVE CORPECTOMY Cervical four - six fusion;  Surgeon: Consuella Lose, MD;  Location: [place] [facility];  Service: Neurosurgery;  Laterality: N/A;  ANTERIOR CERVICAL FIVE CORPECTOMY Cervical four - six fusion   APPLICATION OF WOUND VAC Right 10/22/2018   Procedure: Application Of Wound Vac;  Surgeon: [name], MD;  Location: [place];  Service: Orthopedics;  Laterality: Right;   CYSTOSCOPY WITH RETROGRADE URETHROGRAM N/A 04/25/2018   Procedure: CYSTOSCOPY WITH RETROGRADE URETHROGRAM/ BALLOON DILATION;  Surgeon: Kathie Rhodes, MD;  Location: [facility];  Service: Urology;  Laterality: N/A;   MULTIPLE TOOTH EXTRACTIONS     POSTERIOR  CERVICAL FUSION/FORAMINOTOMY N/A 11/29/2015   Procedure: Cervical Three-Cervical Seven Posterior Cervical Laminectomy with Fusion;  Surgeon: Consuella Lose, MD;  Location: [place] [facility];  Service: Neurosurgery;  Laterality: N/A;  Cervical Three-Cervical Seven Posterior Cervical Laminectomy with Fusion   STUMP REVISION Right 10/22/2018   Procedure: REVISION RIGHT BELOW KNEE AMPUTATION;  Surgeon: [name], MD;  Location: [place];  Service: Orthopedics;  Laterality: Right;   STUMP REVISION Right 12/05/2018   Procedure: Revision Right Above Knee Amputation;  Surgeon: [name], MD;  Location: [place];  Service: Orthopedics;  Laterality: Right;   STUMP REVISION Right 05/29/2019    Procedure: REVISION RIGHT ABOVE KNEE AMPUTATION;  Surgeon: [name], MD;  Location: [place];  Service: Orthopedics;  Laterality: Right;   STUMP REVISION Right 06/24/2019   Procedure: STUMP REVISION;  Surgeon: [name], MD;  Location: [place];  Service: Orthopedics;  Laterality: Right;     Social History:   reports that he has been smoking cigarettes. He has been smoking an average of .5 packs per day. He has never used smokeless tobacco. He reports that he does not drink alcohol and does not use drugs.   Family History:  His family history includes Bone cancer in his father; Cancer in his father, mother, paternal grandfather, and another family member; Diabetes in his mother and another family member; Heart disease in his mother; Prostate cancer in his father.   Allergies No Known Allergies   Home Medications  Prior to Admission medications   Medication Sig Start Date End Date Taking? Authorizing Provider  acetaminophen (TYLENOL) 325 MG tablet Take 650 mg by mouth every 4 (four) hours as needed for moderate pain or mild pain (general discomfort).    [provider]  amLODipine (NORVASC) 10 MG tablet Take 10 mg by mouth daily.    [provider]  azelastine (ASTELIN) 0.1 % nasal spray Place 1 spray into both nostrils daily.    [provider]  azithromycin (ZITHROMAX) 250 MG tablet Take 1 tablet daily 09/08/21   Charlynne Cousins, MD  buPROPion (WELLBUTRIN XL) 300 MG 24 hr tablet Take 300 mg by mouth daily.    [provider]  cetirizine (ZYRTEC) 10 MG tablet Take 10 mg by mouth daily. Patient not taking: Reported on 09/06/2021    [provider]  diclofenac Sodium (VOLTAREN) 1 % GEL Apply 4 g topically every 8 (eight) hours as needed (right leg pain).    [provider]  docusate sodium (COLACE) 100 MG capsule Take 1 capsule (100 mg total) by mouth 2 (two) times daily. 05/31/19   Amin, Jeanella Flattery, MD  DULoxetine (CYMBALTA) 60  MG capsule Take 60 mg by mouth daily.    [provider]  Eszopiclone 3 MG TABS Take 3 mg by mouth at bedtime. 09/29/20   [provider]  ferrous sulfate 325 (65 FE) MG tablet Take 1 tablet (325 mg total) by mouth daily with breakfast. 11/23/18   Matilde Haymaker, MD  fluticasone [facility]) 50 MCG/ACT nasal spray Place 1 spray into both nostrils daily.    [provider]  furosemide (LASIX) 20 MG tablet Take 20 mg by mouth See admin instructions. Give 41m by mouth every Tuesday, Thursday, Saturday, Sunday for leg swelling    [provider]  gabapentin (NEURONTIN) 300 MG capsule Take 300 mg by mouth 3 (three) times daily.    [provider]  glipiZIDE (GLUCOTROL) 10 MG tablet Take 10 mg by  mouth daily before breakfast.    [provider]  GLUCAGON HCL IJ Inject 1 mg as directed once as needed (hypoglycemia).    [provider]  hydrOXYzine (ATARAX) 25 MG tablet Take 25 mg by mouth every 6 (six) hours as needed for itching.    [provider]  insulin glargine (LANTUS) 100 UNIT/ML injection Inject 12 Units into the skin 2 (two) times daily.    [provider]  insulin lispro (HUMALOG) 100 UNIT/ML KwikPen Inject 0-10 Units into the skin See admin instructions. Per sliding scale before meals and at bedtime. <70 or >400 = call md 151-200 = 2 units 201-50 = 4 units 251-300 = 6 units 301-350 =  8 units 351-400 = 10 units    [provider]  ipratropium-albuterol (DUONEB) 0.5-2.5 (3) MG/3ML SOLN Take 3 mLs by nebulization every 6 (six) hours.    [provider]  Lidocaine 4 % PTCH Apply 1 patch topically in the morning and at bedtime.    [provider]  magnesium oxide (MAG-OX) 400 MG tablet Take 400 mg by mouth 2 (two) times daily.    [provider]  Menthol, Topical Analgesic, (BIOFREEZE) 4 % GEL Apply 1 application topically every 8 (eight) hours as needed (pain to right leg/stump).     [provider]  metoprolol tartrate (LOPRESSOR) 25 MG tablet Take 0.5 tablets (12.5 mg total) by mouth 2 (two) times daily. 07/18/19 09/06/22  Elodia Florence., MD  naloxone [facility]) 4 MG/0.1ML LIQD nasal spray kit Place 1 spray into the nose as needed (poorly responsive or turning blue).    [provider]  omeprazole (PRILOSEC) 20 MG capsule Take 20 mg by mouth at bedtime.    [provider]  ondansetron (ZOFRAN) 4 MG tablet Take 4 mg by mouth every 8 (eight) hours as needed for nausea.    [provider]  OVER THE COUNTER MEDICATION Take 30 mLs by mouth in the morning and at bedtime. Pro Heal    [provider]  oxybutynin (DITROPAN-XL) 10 MG 24 hr tablet Take 10 mg by mouth daily.    [provider]  oxyCODONE (OXY IR/ROXICODONE) 5 MG immediate release tablet Take 1 tablet (5 mg total) by mouth every 8 (eight) hours as needed for severe pain (pain). 09/08/21   Charlynne Cousins, MD  oxycodone (ROXICODONE) 30 MG immediate release tablet Take 1 tablet (30 mg total) by mouth every 8 (eight) hours. 09/08/21   Charlynne Cousins, MD  phenazopyridine (PYRIDIUM) 100 MG tablet Take 100 mg by mouth every 8 (eight) hours as needed for pain (urinary pain).    [provider]  polyethylene glycol (MIRALAX / GLYCOLAX) 17 g packet Take 17 g by mouth daily.    [provider]  pregabalin (LYRICA) 150 MG capsule Take 1 capsule (150 mg total) by mouth 2 (two) times daily. 07/07/21   Thurnell Lose, MD  rOPINIRole (REQUIP) 2 MG tablet Take 1 mg by mouth in the morning, at noon, and at bedtime.    [provider]  rosuvastatin (CRESTOR) 10 MG tablet Take 10 mg by mouth at bedtime.    [provider]  senna-docusate (SENOKOT-S) 8.6-50 MG tablet Take 1 tablet by mouth at bedtime.    [provider]  tamsulosin (FLOMAX) 0.4 MG CAPS capsule Take 0.4 mg by mouth every evening.  11/26/13   [provider]   Tiotropium Bromide Monohydrate 1.25 MCG/ACT AERS Inhale 2 puffs  into the lungs daily.    [provider]  tiZANidine (ZANAFLEX) 4 MG tablet Take 4 mg by mouth every 6 (six) hours as needed for muscle spasms.    [provider]  triamcinolone cream (KENALOG) 0.1 % Apply 1 application topically 2 (two) times daily. Left lower leg    [provider]  vitamin B-12 (CYANOCOBALAMIN) 500 MCG tablet Take 1,000 mcg by mouth daily.     [provider]     Critical care time:  50 min  Kourtni Stineman D. Kenton Kingfisher, NP-C [facility] Personal contact information can be found on Amion  01/24/2022, 6:07 PM

## 2022-01-24 NOTE — Sepsis Progress Note (Signed)
Elink is monitoring this code sepsis 

## 2022-01-24 NOTE — ED Triage Notes (Addendum)
Patient BIBA from [place] [facility] rehab facility. Staff at facility states patient went out of the facility and when he returned he was c/o [facility]. When asked what the patient did while off campus, patient states he went to get some drugs. Patient also told EMS that he went out for a breathing treatment. Doctor at facility would like patient drug screened. Hx of CHF, COPD, hypertension, and diabetes.  Vitals were:   200/90 110-HR 22-RR 91% on room air, 3L 94%

## 2022-01-25 ENCOUNTER — Inpatient Hospital Stay (HOSPITAL_COMMUNITY): Payer: Medicaid Other

## 2022-01-25 DIAGNOSIS — B955 Unspecified streptococcus as the cause of diseases classified elsewhere: Secondary | ICD-10-CM

## 2022-01-25 DIAGNOSIS — J9601 Acute respiratory failure with hypoxia: Secondary | ICD-10-CM | POA: Diagnosis not present

## 2022-01-25 DIAGNOSIS — R0609 Other forms of dyspnea: Secondary | ICD-10-CM

## 2022-01-25 DIAGNOSIS — R7881 Bacteremia: Secondary | ICD-10-CM | POA: Diagnosis not present

## 2022-01-25 DIAGNOSIS — N179 Acute kidney failure, unspecified: Secondary | ICD-10-CM | POA: Diagnosis not present

## 2022-01-25 DIAGNOSIS — A419 Sepsis, unspecified organism: Secondary | ICD-10-CM | POA: Diagnosis not present

## 2022-01-25 LAB — GLUCOSE, CAPILLARY
Glucose-Capillary: 95 mg/dL (ref 70–99)
Glucose-Capillary: 111 mg/dL — ABNORMAL HIGH (ref 70–99)
Glucose-Capillary: 125 mg/dL — ABNORMAL HIGH (ref 70–99)
Glucose-Capillary: 143 mg/dL — ABNORMAL HIGH (ref 70–99)

## 2022-01-25 LAB — BASIC METABOLIC PANEL
Anion gap: 9 (ref 5–15)
BUN: 33 mg/dL — ABNORMAL HIGH (ref 8–23)
CO2: 24 mmol/L (ref 22–32)
Calcium: 8.3 mg/dL — ABNORMAL LOW (ref 8.9–10.3)
Chloride: 104 mmol/L (ref 98–111)
Creatinine, Ser: 2.18 mg/dL — ABNORMAL HIGH (ref 0.61–1.24)
GFR, Estimated: 33 mL/min — ABNORMAL LOW (ref >60)
Glucose, Bld: 119 mg/dL — ABNORMAL HIGH (ref 70–99)
Potassium: 4.2 mmol/L (ref 3.5–5.1)
Sodium: 137 mmol/L (ref 135–145)

## 2022-01-25 LAB — CBC
HCT: 34.8 % — ABNORMAL LOW (ref 39.0–52.0)
Hemoglobin: 10.5 g/dL — ABNORMAL LOW (ref 13.0–17.0)
MCH: 28.3 pg (ref 26.0–34.0)
MCHC: 30.2 g/dL (ref 30.0–36.0)
MCV: 93.8 fL (ref 80.0–100.0)
Platelets: 155 10*3/uL (ref 150–400)
RBC: 3.71 MIL/uL — ABNORMAL LOW (ref 4.22–5.81)
RDW: 15.7 % — ABNORMAL HIGH (ref 11.5–15.5)
WBC: 12.8 10*3/uL — ABNORMAL HIGH (ref 4.0–10.5)
nRBC: 0 % (ref 0.0–0.2)

## 2022-01-25 LAB — BLOOD CULTURE ID PANEL (REFLEXED) - BCID2

## 2022-01-25 LAB — ECHOCARDIOGRAM COMPLETE
AR max vel: 3.68 cm2
AV Area VTI: 4.21 cm2
AV Area mean vel: 4.06 cm2
AV Mean grad: 7 mmHg
AV Peak grad: 15.1 mmHg
Ao pk vel: 1.94 m/s
Height: 72 in
S' Lateral: 2.6 cm
Weight: 4021.19 oz

## 2022-01-25 MED ORDER — FENTANYL CITRATE PF 50 MCG/ML IJ SOSY
25.0000 ug | PREFILLED_SYRINGE | Freq: Once | INTRAMUSCULAR | Status: AC
  Administered 2022-01-25: 25 ug via INTRAVENOUS
  Filled 2022-01-25: qty 1

## 2022-01-25 MED ORDER — PREGABALIN 75 MG PO CAPS
150.0000 mg | ORAL_CAPSULE | Freq: Two times a day (BID) | ORAL | Status: DC
  Administered 2022-01-25 – 2022-01-31 (×12): 150 mg via ORAL
  Filled 2022-01-25 (×12): qty 2

## 2022-01-25 MED ORDER — OXYMETAZOLINE HCL 0.05 % NA SOLN
1.0000 | Freq: Two times a day (BID) | NASAL | Status: AC
Start: 2022-01-25 — End: 2022-01-27
  Administered 2022-01-25 – 2022-01-27 (×6): 1 via NASAL
  Filled 2022-01-25: qty 15

## 2022-01-25 MED ORDER — TIZANIDINE HCL 4 MG PO TABS
4.0000 mg | ORAL_TABLET | Freq: Four times a day (QID) | ORAL | Status: DC | PRN
  Administered 2022-01-25 – 2022-01-30 (×12): 4 mg via ORAL
  Filled 2022-01-25 (×13): qty 1

## 2022-01-25 MED ORDER — PANTOPRAZOLE SODIUM 40 MG PO TBEC
40.0000 mg | DELAYED_RELEASE_TABLET | Freq: Every day | ORAL | Status: DC
  Administered 2022-01-25 – 2022-01-31 (×7): 40 mg via ORAL
  Filled 2022-01-25 (×7): qty 1

## 2022-01-25 MED ORDER — OXYCODONE HCL ER 15 MG PO T12A
15.0000 mg | EXTENDED_RELEASE_TABLET | Freq: Two times a day (BID) | ORAL | Status: DC
  Administered 2022-01-25: 15 mg via ORAL
  Filled 2022-01-25: qty 1

## 2022-01-25 MED ORDER — FLUTICASONE PROPIONATE 50 MCG/ACT NA SUSP
1.0000 | Freq: Every day | NASAL | Status: DC
  Administered 2022-01-25 – 2022-01-31 (×5): 1 via NASAL
  Filled 2022-01-25 (×2): qty 16

## 2022-01-25 MED ORDER — METOPROLOL TARTRATE 12.5 MG HALF TABLET
12.5000 mg | ORAL_TABLET | Freq: Two times a day (BID) | ORAL | Status: DC
  Administered 2022-01-25 – 2022-01-31 (×12): 12.5 mg via ORAL
  Filled 2022-01-25 (×12): qty 1

## 2022-01-25 MED ORDER — AMLODIPINE BESYLATE 10 MG PO TABS
10.0000 mg | ORAL_TABLET | Freq: Every day | ORAL | Status: DC
  Administered 2022-01-25 – 2022-01-31 (×7): 10 mg via ORAL
  Filled 2022-01-25 (×7): qty 1

## 2022-01-25 MED ORDER — GABAPENTIN 300 MG PO CAPS: 300.0000 mg | ORAL_CAPSULE | Freq: Three times a day (TID) | ORAL | Status: DC

## 2022-01-25 MED ORDER — TAMSULOSIN HCL 0.4 MG PO CAPS
0.4000 mg | ORAL_CAPSULE | Freq: Every evening | ORAL | Status: DC
  Administered 2022-01-25 – 2022-01-30 (×6): 0.4 mg via ORAL
  Filled 2022-01-25 (×6): qty 1

## 2022-01-25 MED ORDER — IPRATROPIUM-ALBUTEROL 0.5-2.5 (3) MG/3ML IN SOLN
3.0000 mL | Freq: Four times a day (QID) | RESPIRATORY_TRACT | Status: DC
  Administered 2022-01-25 (×2): 3 mL via RESPIRATORY_TRACT
  Filled 2022-01-25 (×4): qty 3

## 2022-01-25 MED ORDER — FENTANYL CITRATE PF 50 MCG/ML IJ SOSY
25.0000 ug | PREFILLED_SYRINGE | INTRAMUSCULAR | Status: DC | PRN
  Administered 2022-01-25 – 2022-01-26 (×3): 25 ug via INTRAVENOUS
  Filled 2022-01-25 (×3): qty 1

## 2022-01-25 MED ORDER — FUROSEMIDE 10 MG/ML IJ SOLN
40.0000 mg | Freq: Once | INTRAMUSCULAR | Status: AC
  Administered 2022-01-25: 40 mg via INTRAVENOUS
  Filled 2022-01-25: qty 4

## 2022-01-25 MED ORDER — POLYETHYLENE GLYCOL 3350 17 G PO PACK
17.0000 g | PACK | Freq: Every day | ORAL | Status: DC
  Administered 2022-01-25 – 2022-01-31 (×4): 17 g via ORAL
  Filled 2022-01-25 (×6): qty 1

## 2022-01-25 MED ORDER — SENNOSIDES-DOCUSATE SODIUM 8.6-50 MG PO TABS
1.0000 | ORAL_TABLET | Freq: Every day | ORAL | Status: DC
  Administered 2022-01-25 – 2022-01-30 (×6): 1 via ORAL
  Filled 2022-01-25 (×6): qty 1

## 2022-01-25 MED ORDER — OXYCODONE HCL 5 MG PO TABS
30.0000 mg | ORAL_TABLET | Freq: Three times a day (TID) | ORAL | Status: DC
  Administered 2022-01-25 – 2022-01-31 (×19): 30 mg via ORAL
  Filled 2022-01-25 (×19): qty 6

## 2022-01-25 MED ORDER — LORATADINE 10 MG PO TABS
10.0000 mg | ORAL_TABLET | Freq: Every day | ORAL | Status: DC
  Administered 2022-01-25 – 2022-01-31 (×7): 10 mg via ORAL
  Filled 2022-01-25 (×5): qty 1

## 2022-01-25 MED ORDER — ROSUVASTATIN CALCIUM 10 MG PO TABS
10.0000 mg | ORAL_TABLET | Freq: Every day | ORAL | Status: DC
  Administered 2022-01-25 – 2022-01-30 (×6): 10 mg via ORAL
  Filled 2022-01-25 (×6): qty 1

## 2022-01-25 MED ORDER — DOCUSATE SODIUM 100 MG PO CAPS
100.0000 mg | ORAL_CAPSULE | Freq: Two times a day (BID) | ORAL | Status: DC
Start: 2022-01-25 — End: 2022-01-29
  Administered 2022-01-25 – 2022-01-28 (×6): 100 mg via ORAL
  Filled 2022-01-25 (×9): qty 1

## 2022-01-25 MED ORDER — PERFLUTREN LIPID MICROSPHERE
1.0000 mL | INTRAVENOUS | Status: AC | PRN
  Administered 2022-01-25: 3 mL via INTRAVENOUS

## 2022-01-25 MED ORDER — ALUM & MAG HYDROXIDE-SIMETH 200-200-20 MG/5ML PO SUSP
30.0000 mL | ORAL | Status: DC | PRN
  Administered 2022-01-25 – 2022-01-31 (×6): 30 mL via ORAL
  Filled 2022-01-25 (×6): qty 30

## 2022-01-25 NOTE — Progress Notes (Signed)
Pt agitated, loud screaming for oxycodone, stated he takes it every 5 am.

## 2022-01-25 NOTE — Progress Notes (Addendum)
NAME:  Gregory [name], MRN:  [id number], DOB:  07/09/1960, LOS: 1 ADMISSION DATE:  01/24/2022, CONSULTATION DATE: 01/24/2022 REFERRING MD:  Dr. Pearline Cables - ED, CHIEF COMPLAINT: Sepsis  History of Present Illness:  61 year old male with a past medical history significant for but not limited to congestive heart failure, COPD, chronic kidney disease stage IIIb type 2 diabetes, osteomyelitis now s/p right AKA, hypertension, hyperlipidemia, prior MRSA bacteremia, prior epidural abscess, and depression who presented to the ED from assisted living facility for reports of fever and "not acting right". Some concern for illcit drug use prior to admission.  On ED arrival patient was found to severely febrile with temperature 104.5, heart rate 115, respiratory rate 25, blood pressure 145/108.  Chest x-ray obtained and revealed mild cardiomegaly with ill-defined opacities of the left midlung and right lower lung suspicious for infiltrates.  Pertinent lab work on admission included glucose 164, BUN 30, creatinine 2.21, GFR 33, WBC 13.9.  Given acute concern for sepsis on arrival PCCM consulted for further management and admission  Of note patient was recently admitted and treated at this facility in March 2023 for multifocal pneumonia.  Pertinent  Medical History  Congestive heart failure, COPD, chronic kidney disease stage IIIb, type 2 diabetes, osteomyelitis now s/p right AKA, hypertension, hyperlipidemia, prior MRSA bacteremia, prior epidural abscess, and depression  Significant Hospital Events: Including procedures, antibiotic start and stop dates in addition to other pertinent events   8/9 Presented from a ALF febrile with Tmax 104.6, Rx for sepsis, UDS + THC, opiates   Interim History / Subjective:  Afebrile, 3 of 4 blood cultures with strep species, results pending  Mental status significantly improved. Pt reports pain in back, "stump" and nasal stuffiness.  "Feel like I can go back to my facility" [place] 3L   Glucose range 95-111 I/O 2.2L UOP, -935m in last 24 hours   Objective   Blood pressure (!) 158/74, pulse 99, temperature 97.7 F (36.5 C), temperature source Axillary, resp. rate 19, height 6' (1.829 m), weight 114 kg, SpO2 98 %.        Intake/Output Summary (Last 24 hours) at 01/25/2022 0800 Last data filed at 01/25/2022 [id number] per 24 hour  Intake 1297.84 ml  Output 2200 ml  Net -902.16 ml   Filed Weights   01/24/22 1441 01/25/22 0500  Weight: 114 kg 114 kg    Examination: General: adult male sitting up on side of bed in NAD, appears uncomfortable  HEENT: MM pink/moist, right eye chronic swelling / deformity, pupils =/reactive, anicteric  Neuro: Awake, alert, speech clear, oriented to self / place, wonders why he ended up in the hospital, MAE/non-focal CV: s1s2 RRR, no m/r/g PULM: non-labored at rest, lungs bilaterally clear  GI: soft, bsx4 active  Extremities: warm/dry, LLE chronic appearing edema, toe amputation x2 on left (first/second ray) Skin: LLE with chronic changes, raised areas, small circular abrasion on left knee  Resolved Hospital Problem list     Assessment & Plan:   Severe Sepsis with BCID positive for Strep  Fever 104.6 on admit, multiple possible sources considered initially. Cellulitis vs PNA. Hx MRSA bacteremia, epidural abscess, ESBL.  -stop meropenem  -continue vancomycin  -follow blood cultures to maturity, 2 of 3 with GPC's in chains -lactate negative  -PRN antipyretic   Acute Metabolic Encephalopathy Chronic Right Orbital Erythema & Edema  ALF resident. Suspect altered in setting of fever, UDS + THC, infection.  CT head/neck without acute abnormality. Ammonia 26.  -supportive care,  significantly improved mental status  -promote sleep / wake cycle    HCAP Ruled Out Nasal Congestion  History of COPD -follow intermittent CXR  -add afrin x3 days  -wean O2 for sats 88-95% -stop meropenem  -resume duoneb Q6   Acute Kidney Injury  superimposed on CKD stage IIIb Baseline Cr ~ 1.6-1.8.   -Trend BMP / urinary output -Replace electrolytes as indicated -Avoid nephrotoxic agents, ensure adequate renal perfusion -gentle hydration for now, repeat labs pending   History of Chronic Diastolic Congestive Heart Failure HTN, HLD  ECHO 06/2021 with LVEF 50-55%, no RWMA, normal RVS function  -tele monitoring  -await ECHO  -follow I/O's  -resume home crestor, norvasc, lopressor  Anemia, likely of chronic disease -trend CBC  -transfuse for Hgb <7%  DM II  -glucose goal 140-180 -SSI, moderate coverage -hold long acting for now   Chronic LLE Lymphedema R/O Cellulitis  -note GPC's in culture, strep on BCID -abx narrowed to vancomycin 8/10  Chronic Pain  Hx Drug Seeking Behavior  -resume narcotics at lower dose  -resume home lyrica  -hold other agents, consider dose reductions over time given polypharmacy    Best Practice (right click and "Reselect all SmartList Selections" daily)  Diet/type: Regular consistency (see orders) DVT prophylaxis: prophylactic heparin  GI prophylaxis: PPI Lines: N/A Foley:  N/A Code Status:  full code Last date of multidisciplinary goals of care discussion: Daughter, [name], updated on plan of care 8/10  Critical care time:  [place], MSN, APRN, NP-C, AGACNP-BC [facility] 01/25/2022, 8:00 AM   Please see [email or web address] for pager details.   From 7A-7P if no response, please call [phone] After hours, please call ELink [phone]

## 2022-01-25 NOTE — Progress Notes (Signed)
PHARMACY - PHYSICIAN COMMUNICATION CRITICAL VALUE ALERT - BLOOD CULTURE IDENTIFICATION (BCID)  Gregory [name] is an 61 y.o. male who presented to [facility] on 01/24/2022 with a chief complaint of No chief complaint on file.    Assessment:  Multiple potential sources of infection including cellulitis versus pneumonia    Name of physician (or Provider) Contacted: Noe Gens, NP  Current antibiotics: vancomycin and meropenem  Changes to prescribed antibiotics recommended:  - continue both vancomycin and meropenem for now   Results for orders placed or performed during the hospital encounter of 01/24/22  Blood Culture ID Panel (Reflexed) (Collected: 01/24/2022  2:54 PM)  Result Value Ref Range   Enterococcus faecalis NOT DETECTED NOT DETECTED   Enterococcus Faecium NOT DETECTED NOT DETECTED   Listeria monocytogenes NOT DETECTED NOT DETECTED   Staphylococcus species NOT DETECTED NOT DETECTED   Staphylococcus aureus (BCID) NOT DETECTED NOT DETECTED   Staphylococcus epidermidis NOT DETECTED NOT DETECTED   Staphylococcus lugdunensis NOT DETECTED NOT DETECTED   Streptococcus species DETECTED (A) NOT DETECTED   Streptococcus agalactiae NOT DETECTED NOT DETECTED   Streptococcus pneumoniae NOT DETECTED NOT DETECTED   Streptococcus pyogenes NOT DETECTED NOT DETECTED   A.calcoaceticus-baumannii NOT DETECTED NOT DETECTED   Bacteroides fragilis NOT DETECTED NOT DETECTED   Enterobacterales NOT DETECTED NOT DETECTED   Enterobacter cloacae complex NOT DETECTED NOT DETECTED   Escherichia coli NOT DETECTED NOT DETECTED   Klebsiella aerogenes NOT DETECTED NOT DETECTED   Klebsiella oxytoca NOT DETECTED NOT DETECTED   Klebsiella pneumoniae NOT DETECTED NOT DETECTED   Proteus species NOT DETECTED NOT DETECTED   Salmonella species NOT DETECTED NOT DETECTED   Serratia marcescens NOT DETECTED NOT DETECTED   Haemophilus influenzae NOT DETECTED NOT DETECTED   Neisseria meningitidis NOT DETECTED NOT  DETECTED   Pseudomonas aeruginosa NOT DETECTED NOT DETECTED   Stenotrophomonas maltophilia NOT DETECTED NOT DETECTED   Candida albicans NOT DETECTED NOT DETECTED   Candida auris NOT DETECTED NOT DETECTED   Candida glabrata NOT DETECTED NOT DETECTED   Candida krusei NOT DETECTED NOT DETECTED   Candida parapsilosis NOT DETECTED NOT DETECTED   Candida tropicalis NOT DETECTED NOT DETECTED   Cryptococcus neoformans/gattii NOT DETECTED NOT DETECTED    Royetta Asal, PharmD, BCPS 01/25/2022 8:37 AM

## 2022-01-25 NOTE — NC FL2 (Signed)
[name] LEVEL OF CARE SCREENING TOOL     IDENTIFICATION  Patient Name: Gregory [name] Birthdate: Nov 27, 1960 Sex: male Admission Date (Current Location): 01/24/2022  [place] and [place] Number:  Gregory [name]  ([id number]) Facility and Address:  [facility],  [place], [place]      Provider Number: [id number]  Attending Physician Name and Address:  Julian Hy, DO  Relative Name and Phone Number:   Gregory [name](dtr) [phone])    Current Level of Care: Hospital Recommended Level of Care: Nursing Facility Prior Approval Number:    Date Approved/Denied:   PASRR Number:    Discharge Plan: Other (Comment) (LTC)    Current Diagnoses: Patient Active Problem List   Diagnosis Date Noted   Multifocal pneumonia 09/05/2021   Hyponatremia 09/05/2021   Anxiety 09/05/2021   BPH (benign prostatic hyperplasia) 09/05/2021   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   Asymptomatic bacteriuria 06/30/2021   Normocytic anemia 06/29/2021   Pain of amputation stump of right lower extremity ([place]) 03/15/2021   Melanoma of cheek ([place]) 10/15/2019   Acute respiratory failure with hypoxia ([place]) 07/11/2019   Acute respiratory disease due to COVID-19 virus 07/11/2019   Wound infection 06/22/2019   Abscess 06/22/2019   Stage 3b chronic kidney disease (CKD) ([place]) 05/23/2019   Tobacco use 05/23/2019   Sepsis ([place]) 05/23/2019   S/P AKA (above knee amputation) unilateral, right ([place]) 12/05/2018   Amputation stump infection ([place])    Lymphedema of left leg    Diabetic polyneuropathy associated with type 2 diabetes mellitus ([place])    Above knee amputation of right lower extremity ([place]) 10/22/2018   Dehiscence of amputation stump ([place])    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) 04/16/2017   Osteomyelitis ([place]) 03/25/2017   Skin cancer 03/25/2017   Depression 11/05/2016   Diabetic ulcer of toe of left foot ([place]) 10/02/2016   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   Chronic pain 09/27/2016   Dyslipidemia associated with type 2 diabetes mellitus ([place]) 06/06/2016   GERD without esophagitis 05/11/2016   Hypertensive heart disease with congestive heart failure ([place]) 12/15/2015   Spinal stenosis in cervical region 12/15/2015   Type II diabetes mellitus with neurological manifestations ([place]) 12/15/2015   Chronic diastolic CHF (congestive heart failure) ([place])    Pulmonary hypertension (HCC)    HTN (hypertension)    HLD (hyperlipidemia)    Urinary retention    Diskitis    Foot drop, bilateral    Sepsis with acute renal failure without septic shock (HCC)    COPD (chronic obstructive pulmonary disease) ([place]) 10/03/2015   Acute osteomyelitis of left foot ([place]) 10/03/2015   MRSA bacteremia 08/13/2015   Chronic low back pain 08/13/2015   Chronic multifocal osteomyelitis, multiple sites ([place]) 08/13/2015   Community acquired pneumonia 08/13/2015    Orientation RESPIRATION BLADDER Height & Weight     Self, Time, Situation, Place  O2 Continent Weight: 114 kg Height:  6' (182.9 cm)  BEHAVIORAL SYMPTOMS/MOOD NEUROLOGICAL BOWEL NUTRITION STATUS      Continent  (CHO MOD)  AMBULATORY STATUS COMMUNICATION OF NEEDS Skin   Limited Assist Verbally Normal                       Personal Care Assistance Level of Assistance  Bathing, Feeding, Dressing Bathing Assistance: Limited assistance Feeding assistance: Limited  assistance Dressing Assistance: Limited assistance     Functional Limitations Info  Sight, Hearing, Speech Sight Info: Impaired (eyeglasses) Hearing Info: Adequate Speech Info: Impaired (Partial lower dentures)    SPECIAL CARE FACTORS FREQUENCY   (R BKA)                    Contractures Contractures Info: Not present    Additional Factors Info  Code Status, Allergies Code Status Info:  (Full) Allergies Info:  (NKA)           Current Medications (01/25/2022):  This is the current hospital active medication list Current Facility-Administered Medications  Medication Dose Route Frequency Provider Last Rate Last Admin   0.9 %  sodium chloride infusion   Intravenous PRN Julian Hy, DO   Stopped at 01/25/22 0854   acetaminophen (TYLENOL) tablet 650 mg  650 mg Oral Q6H PRN [name] P, DO   650 mg at 01/25/22 1309   amLODipine (NORVASC) tablet 10 mg  10 mg Oral Daily Ollis, Brandi L, NP       Chlorhexidine Gluconate Cloth 2 % PADS 6 each  6 each Topical Daily Julian Hy, DO   6 each at 01/25/22 0350   docusate sodium (COLACE) capsule 100 mg  100 mg Oral BID PRN Gerald Leitz D, NP       docusate sodium (COLACE) capsule 100 mg  100 mg Oral BID Ollis, Brandi L, NP   100 mg at 01/25/22 1300   fluticasone (FLONASE) 50 MCG/ACT nasal spray 1 spray  1 spray Each Nare Daily Anders Simmonds, MD   1 spray at 01/25/22 0531   heparin injection 5,000 Units  5,000 Units Subcutaneous Q8H Harris, Loree Fee D, NP   5,000 Units at 01/25/22 7408   hydrALAZINE (APRESOLINE) injection 10 mg  10 mg Intravenous Q6H PRN Harris, Loree Fee D, NP       insulin aspart (novoLOG) injection 0-15 Units  0-15 Units Subcutaneous Q4H Harris, Loree Fee D, NP   5 Units at 01/25/22 1301   ipratropium-albuterol (DUONEB) 0.5-2.5 (3) MG/3ML nebulizer solution 3 mL  3 mL Nebulization Q6H Ollis, Brandi L, NP   3 mL at 01/25/22 1330   labetalol (NORMODYNE) injection 10 mg  10 mg Intravenous Q6H PRN Gerald Leitz D,  NP   10 mg at 01/25/22 0413   loratadine (CLARITIN) tablet 10 mg  10 mg Oral Daily Ollis, Brandi L, NP   10 mg at 01/25/22 1448   metoprolol tartrate (LOPRESSOR) tablet 12.5 mg  12.5 mg Oral BID Noe Gens L, NP       mupirocin ointment (BACTROBAN) 2 % 1 Application  1 Application Nasal BID Julian Hy, DO   1 Application at 18/56/31 4970   Oral care mouth rinse  15 mL Mouth Rinse 4 times per day [name] P, DO   15 mL at 01/24/22 2200   Oral care mouth rinse  15 mL Mouth Rinse PRN [name] P, DO   15 mL at 01/24/22 1828   oxyCODONE (OXYCONTIN) 12 hr tablet 15  mg  15 mg Oral Q12H Ollis, Brandi L, NP   15 mg at 01/25/22 0830   oxymetazoline (AFRIN) 0.05 % nasal spray 1 spray  1 spray Each Nare BID Noe Gens L, NP   1 spray at 01/25/22 4076   polyethylene glycol (MIRALAX / GLYCOLAX) packet 17 g  17 g Oral Daily PRN Harris, Loree Fee D, NP       polyethylene glycol (MIRALAX / GLYCOLAX) packet 17 g  17 g Oral Daily Ollis, Brandi L, NP   17 g at 01/25/22 1300   pregabalin (LYRICA) capsule 150 mg  150 mg Oral BID Ollis, Brandi L, NP       rosuvastatin (CRESTOR) tablet 10 mg  10 mg Oral QHS Ollis, Brandi L, NP       senna-docusate (Senokot-S) tablet 1 tablet  1 tablet Oral QHS Ollis, Brandi L, NP       tamsulosin (FLOMAX) capsule 0.4 mg  0.4 mg Oral QPM Ollis, Brandi L, NP       vancomycin (VANCOCIN) IVPB 1000 mg/200 mL premix  1,000 mg Intravenous Q24H Emiliano Dyer, [facility]         Discharge Medications: Please see discharge summary for a list of discharge medications.  Relevant Imaging Results:  Relevant Lab Results:   Additional Information SS#: [id number]  Dessa Phi, RN

## 2022-01-25 NOTE — Progress Notes (Signed)
[place] Progress Note Patient Name: Gregory [name] DOB: 15-Feb-1961 MRN: [id number]   Date of Service  01/25/2022  HPI/Events of Note  Patient c/o R leg pain which appears to be chronic. Now NPO. No PRN pain medications d/t encephalopathy on admission. UDS on admission is positive for Opiates and THC.  eICU Interventions  Plan: Fentanyl 25 mcg IV X 1.      Intervention Category Major Interventions: Other:  Lysle Dingwall 01/25/2022, 12:49 AM

## 2022-01-25 NOTE — Progress Notes (Signed)
[place] Progress Note Patient Name: Gregory [name] DOB: 01-27-1961 MRN: [id number]   Date of Service  01/25/2022  HPI/Events of Note  Patient agitated again and requesting his home Oxycontin. Opiod withdrawal.  eICU Interventions  Plan: Fentanyl 25 mcg IV x 1.     Intervention Category Major Interventions: Delirium, psychosis, severe agitation - evaluation and management  Karlene Southard Eugene 01/25/2022, 7:04 AM

## 2022-01-25 NOTE — Progress Notes (Addendum)
[place] Progress Note Patient Name: Gregory [name] DOB: 1961/05/13 MRN: [id number]   Date of Service  01/25/2022  HPI/Events of Note  Severe agitation - Patient demanding water. Patient is able to tolerate sips of water without incident. History of DM - Type 2. Patent became calm after being given water.   eICU Interventions  Will advance diet to carb modified clear liquid diet.      Intervention Category Major Interventions: Delirium, psychosis, severe agitation - evaluation and management  Gregory [name] 01/25/2022, 2:42 AM

## 2022-01-25 NOTE — TOC Initial Note (Signed)
Transition of Care [facility]) - Initial/Assessment Note    Patient Details  Name: Gregory [name] MRN: [id number] Date of Birth: 12-Oct-1960  Transition of Care [facility]) CM/SW Contact:    Dessa Phi, RN Phone Number: 01/25/2022, 2:03 PM  Clinical Narrative: from [facility]  rep [place] left vm await call back. R BKA. Noted +opiates,marijuana.d/c plan return back to [facility].                 Expected Discharge Plan: Long Term Nursing Home Barriers to Discharge: Continued Medical Work up   Patient Goals and CMS Choice Patient states their goals for this hospitalization and ongoing recovery are:: Return [place] CMS Medicare.gov Compare Post Acute Care list provided to:: Patient Represenative (must comment) ([name]) Choice offered to / list presented to : Adult Children  Expected Discharge Plan and Services Expected Discharge Plan: [place]   Discharge Planning Services: CM Consult Post Acute Care Choice: Nursing Home Living arrangements for the past 2 months:  (Nursing Home-LTC)                                      Prior Living Arrangements/Services Living arrangements for the past 2 months:  (Nursing Home-LTC) Lives with:: Facility Resident Patient language and need for interpreter reviewed:: Yes Do you feel safe going back to the place where you live?: Yes      Need for Family Participation in Patient Care: Yes (Comment) Care giver support system in place?: Yes (comment)   Criminal Activity/Legal Involvement Pertinent to Current Situation/Hospitalization: No - Comment as needed  Activities of Daily Living Home Assistive Devices/Equipment: Wheelchair, Eyeglasses, Dentures (specify type) (per previous hx - wears dentures and glasses per computer notes) ADL Screening (condition at time of admission) Patient's cognitive ability adequate to safely complete daily activities?: No (not currently but from previous hx his mind is usually ok) Is the  patient deaf or have difficulty hearing?: No Does the patient have difficulty seeing, even when wearing glasses/contacts?: No Does the patient have difficulty concentrating, remembering, or making decisions?: Yes (currently but from previous hx his mind is usually ok) Patient able to express need for assistance with ADLs?: No (not currently) Does the patient have difficulty dressing or bathing?: Yes Independently performs ADLs?: No Communication: Independent Dressing (OT): Needs assistance Is this a change from baseline?: Pre-admission baseline Grooming: Needs assistance Is this a change from baseline?: Change from baseline, expected to last >3 days Feeding: Needs assistance Is this a change from baseline?: Change from baseline, expected to last >3 days Bathing: Needs assistance Is this a change from baseline?: Pre-admission baseline Toileting: Needs assistance Is this a change from baseline?: Pre-admission baseline In/Out Bed: Needs assistance Is this a change from baseline?: Pre-admission baseline Walks in Home: Dependent Is this a change from baseline?: Pre-admission baseline Does the patient have difficulty walking or climbing stairs?: Yes Weakness of Legs: Both (right aka) Weakness of Arms/Hands: Both  Permission Sought/Granted Permission sought to share information with : Case Manager Permission granted to share information with : Yes, Verbal Permission Granted  Share Information with NAME: Case Manager           Emotional Assessment Appearance:: Appears stated age Attitude/Demeanor/Rapport: Gracious Affect (typically observed): Accepting Orientation: : Oriented to Self, Oriented to Place, Oriented to  Time, Oriented to Situation Alcohol / Substance Use: Illicit Drugs Psych Involvement: No (comment)  Admission  diagnosis:  Hyperthermia [R50.9] Sepsis [facility]) [A41.9] Patient Active Problem List   Diagnosis Date Noted   Multifocal pneumonia 09/05/2021   Hyponatremia  09/05/2021   Anxiety 09/05/2021   BPH (benign prostatic hyperplasia) 09/05/2021   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   Asymptomatic bacteriuria 06/30/2021   Normocytic anemia 06/29/2021   Pain of amputation stump of right lower extremity ([place]) 03/15/2021   Melanoma of cheek ([place]) 10/15/2019   Acute respiratory failure with hypoxia ([place]) 07/11/2019   Acute respiratory disease due to COVID-19 virus 07/11/2019   Wound infection 06/22/2019   Abscess 06/22/2019   Stage 3b chronic kidney disease (CKD) ([place]) 05/23/2019   Tobacco use 05/23/2019   Sepsis ([place]) 05/23/2019   S/P AKA (above knee amputation) unilateral, right ([place]) 12/05/2018   Amputation stump infection ([place])    Lymphedema of left leg    Diabetic polyneuropathy associated with type 2 diabetes mellitus ([place])    Above knee amputation of right lower extremity ([place]) 10/22/2018   Dehiscence of amputation stump ([place])    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[place]) 08/12/2018   Candidiasis 08/12/2018   Necrosis of toe ([place]) 07/08/2018   Amputated toe of left foot ([place]) 06/18/2018   Streptococcal infection    Pressure injury, stage 3 ([place]) 06/06/2018   Cutaneous abscess of left foot    Moderate protein-calorie malnutrition (HCC)    Septic arthritis ([place]) 06/03/2018   Idiopathic chronic venous hypertension of left lower extremity with ulcer and inflammation ([place]) 11/14/2017   Great toe amputation status, left 11/14/2017   Enlarged lymph node in neck 07/18/2017   Malignant melanoma of face ([place]) 04/16/2017   Osteomyelitis ([place]) 03/25/2017   Skin cancer 03/25/2017   Depression 11/05/2016   Diabetic ulcer of toe of left foot ([place]) 10/02/2016   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   Chronic pain 09/27/2016   Dyslipidemia associated with type 2 diabetes mellitus ([place]) 06/06/2016   GERD without esophagitis 05/11/2016   Hypertensive heart disease with congestive heart failure ([place]) 12/15/2015   Spinal stenosis in cervical region  12/15/2015   Type II diabetes mellitus with neurological manifestations ([place]) 12/15/2015   Chronic diastolic CHF (congestive heart failure) (HCC)    Pulmonary hypertension (HCC)    HTN (hypertension)    HLD (hyperlipidemia)    Urinary retention    Diskitis    Foot drop, bilateral    Sepsis with acute renal failure without septic shock (HCC)    COPD (chronic obstructive pulmonary disease) ([place]) 10/03/2015   Acute osteomyelitis of left foot ([place]) 10/03/2015   MRSA bacteremia 08/13/2015   Chronic low back pain 08/13/2015   Chronic multifocal osteomyelitis, multiple sites ([place]) 08/13/2015   Community acquired pneumonia 08/13/2015   PCP:  [name], MD Pharmacy:  No Pharmacies Listed    Social Determinants of Health (SDOH) Interventions    Readmission Risk Interventions    07/03/2021    2:54 PM 06/23/2019   10:33 AM  Readmission Risk Prevention Plan  Transportation Screening Complete Complete  PCP or Specialist Appt within 3-5 Days  Not Complete  Not Complete comments  SNF pt  HRI or [place]  Complete  Social Work Consult for [place] Planning/Counseling  Complete  Palliative Care Screening  Not Applicable  Medication Review (RN Care Manager) Referral to Pharmacy Referral to Pharmacy  PCP or Specialist appointment within 3-5 days of discharge Complete Not Complete  PCP/Specialist Appt Not Complete comments  SNF pt  HRI or Home Care Consult Complete Complete  SW  Recovery Care/Counseling Consult Complete Complete  Palliative Care Screening Not Applicable Not Applicable  Skilled Nursing Facility Complete Complete

## 2022-01-25 NOTE — Progress Notes (Signed)
[place] Progress Note Patient Name: Gregory [name] DOB: December 13, 1960 MRN: [id number]   Date of Service  01/25/2022  HPI/Events of Note  Patient requests home Flonase Nasal spray.   eICU Interventions  Will order Flonase 1 spray to each nostril now and Q day.      Intervention Category Major Interventions: Other:  Markavious Micco Cornelia Copa 01/25/2022, 3:26 AM

## 2022-01-25 NOTE — Progress Notes (Signed)
Echocardiogram 2D Echocardiogram has been performed.  Oneal Deputy Araly Kaas RDCS 01/25/2022, 11:41 AM

## 2022-01-26 ENCOUNTER — Inpatient Hospital Stay (HOSPITAL_COMMUNITY): Payer: Medicaid Other

## 2022-01-26 DIAGNOSIS — J690 Pneumonitis due to inhalation of food and vomit: Secondary | ICD-10-CM

## 2022-01-26 DIAGNOSIS — R7881 Bacteremia: Secondary | ICD-10-CM | POA: Diagnosis not present

## 2022-01-26 DIAGNOSIS — R509 Fever, unspecified: Secondary | ICD-10-CM | POA: Diagnosis not present

## 2022-01-26 DIAGNOSIS — A419 Sepsis, unspecified organism: Secondary | ICD-10-CM | POA: Diagnosis not present

## 2022-01-26 DIAGNOSIS — N189 Chronic kidney disease, unspecified: Secondary | ICD-10-CM

## 2022-01-26 DIAGNOSIS — N179 Acute kidney failure, unspecified: Secondary | ICD-10-CM | POA: Diagnosis not present

## 2022-01-26 DIAGNOSIS — R7401 Elevation of levels of liver transaminase levels: Secondary | ICD-10-CM

## 2022-01-26 LAB — GLUCOSE, CAPILLARY
Glucose-Capillary: 124 mg/dL — ABNORMAL HIGH (ref 70–99)
Glucose-Capillary: 144 mg/dL — ABNORMAL HIGH (ref 70–99)
Glucose-Capillary: 124 mg/dL — ABNORMAL HIGH (ref 70–99)
Glucose-Capillary: 116 mg/dL — ABNORMAL HIGH (ref 70–99)
Glucose-Capillary: 125 mg/dL — ABNORMAL HIGH (ref 70–99)
Glucose-Capillary: 120 mg/dL — ABNORMAL HIGH (ref 70–99)
Glucose-Capillary: 139 mg/dL — ABNORMAL HIGH (ref 70–99)
Glucose-Capillary: 158 mg/dL — ABNORMAL HIGH (ref 70–99)

## 2022-01-26 LAB — CBC WITH DIFFERENTIAL/PLATELET
Abs Immature Granulocytes: 0.05 10*3/uL (ref 0.00–0.07)
Basophils Absolute: 0 10*3/uL (ref 0.0–0.1)
Basophils Relative: 1 %
Eosinophils Absolute: 0 10*3/uL (ref 0.0–0.5)
Eosinophils Relative: 0 %
HCT: 33 % — ABNORMAL LOW (ref 39.0–52.0)
Hemoglobin: 10 g/dL — ABNORMAL LOW (ref 13.0–17.0)
Immature Granulocytes: 1 %
Lymphocytes Relative: 13 %
Lymphs Abs: 0.8 10*3/uL (ref 0.7–4.0)
MCH: 28.2 pg (ref 26.0–34.0)
MCHC: 30.3 g/dL (ref 30.0–36.0)
MCV: 93.2 fL (ref 80.0–100.0)
Monocytes Absolute: 0.5 10*3/uL (ref 0.1–1.0)
Monocytes Relative: 8 %
Neutro Abs: 5.2 10*3/uL (ref 1.7–7.7)
Neutrophils Relative %: 77 %
Platelets: 131 10*3/uL — ABNORMAL LOW (ref 150–400)
RBC: 3.54 MIL/uL — ABNORMAL LOW (ref 4.22–5.81)
RDW: 15.9 % — ABNORMAL HIGH (ref 11.5–15.5)
WBC: 6.7 10*3/uL (ref 4.0–10.5)
nRBC: 0 % (ref 0.0–0.2)

## 2022-01-26 LAB — URINE CULTURE

## 2022-01-26 LAB — COMPREHENSIVE METABOLIC PANEL
ALT: 40 U/L (ref 0–44)
AST: 54 U/L — ABNORMAL HIGH (ref 15–41)
Albumin: 2.9 g/dL — ABNORMAL LOW (ref 3.5–5.0)
Alkaline Phosphatase: 60 U/L (ref 38–126)
Anion gap: 7 (ref 5–15)
BUN: 41 mg/dL — ABNORMAL HIGH (ref 8–23)
CO2: 24 mmol/L (ref 22–32)
Calcium: 8.6 mg/dL — ABNORMAL LOW (ref 8.9–10.3)
Chloride: 107 mmol/L (ref 98–111)
Creatinine, Ser: 2.52 mg/dL — ABNORMAL HIGH (ref 0.61–1.24)
GFR, Estimated: 28 mL/min — ABNORMAL LOW (ref >60)
Glucose, Bld: 121 mg/dL — ABNORMAL HIGH (ref 70–99)
Potassium: 4.4 mmol/L (ref 3.5–5.1)
Sodium: 138 mmol/L (ref 135–145)
Total Bilirubin: 0.7 mg/dL (ref 0.3–1.2)
Total Protein: 6.3 g/dL — ABNORMAL LOW (ref 6.5–8.1)

## 2022-01-26 LAB — MAGNESIUM: Magnesium: 2.1 mg/dL (ref 1.7–2.4)

## 2022-01-26 LAB — PHOSPHORUS: Phosphorus: 4.1 mg/dL (ref 2.5–4.6)

## 2022-01-26 MED ORDER — INSULIN ASPART 100 UNIT/ML IJ SOLN
0.0000 [IU] | Freq: Three times a day (TID) | INTRAMUSCULAR | Status: DC
  Administered 2022-01-26: 2 [IU] via SUBCUTANEOUS
  Administered 2022-01-26: 3 [IU] via SUBCUTANEOUS
  Administered 2022-01-27: 5 [IU] via SUBCUTANEOUS
  Administered 2022-01-27: 2 [IU] via SUBCUTANEOUS
  Administered 2022-01-27: 5 [IU] via SUBCUTANEOUS
  Administered 2022-01-28 (×3): 3 [IU] via SUBCUTANEOUS
  Administered 2022-01-30 (×2): 2 [IU] via SUBCUTANEOUS
  Administered 2022-01-30: 3 [IU] via SUBCUTANEOUS
  Administered 2022-01-31: 2 [IU] via SUBCUTANEOUS
  Administered 2022-01-31: 3 [IU] via SUBCUTANEOUS

## 2022-01-26 MED ORDER — IPRATROPIUM BROMIDE 0.02 % IN SOLN: 0.5000 mg | Freq: Four times a day (QID) | RESPIRATORY_TRACT | Status: DC | PRN

## 2022-01-26 MED ORDER — LEVALBUTEROL HCL 0.63 MG/3ML IN NEBU
0.6300 mg | INHALATION_SOLUTION | Freq: Four times a day (QID) | RESPIRATORY_TRACT | Status: DC
Start: 2022-01-26 — End: 2022-01-26
  Filled 2022-01-26: qty 3

## 2022-01-26 MED ORDER — IPRATROPIUM BROMIDE 0.02 % IN SOLN
0.5000 mg | Freq: Four times a day (QID) | RESPIRATORY_TRACT | Status: DC
  Filled 2022-01-26: qty 2.5

## 2022-01-26 MED ORDER — GUAIFENESIN ER 600 MG PO TB12
1200.0000 mg | ORAL_TABLET | Freq: Two times a day (BID) | ORAL | Status: DC
  Administered 2022-01-26 – 2022-01-31 (×11): 1200 mg via ORAL
  Filled 2022-01-26 (×11): qty 2

## 2022-01-26 MED ORDER — HYDROMORPHONE HCL 1 MG/ML IJ SOLN
1.0000 mg | INTRAMUSCULAR | Status: DC | PRN
  Administered 2022-01-26 – 2022-01-31 (×23): 1 mg via INTRAVENOUS
  Filled 2022-01-26 (×24): qty 1

## 2022-01-26 MED ORDER — POLYVINYL ALCOHOL 1.4 % OP SOLN
1.0000 [drp] | OPHTHALMIC | Status: DC | PRN
  Administered 2022-01-26: 1 [drp] via OPHTHALMIC
  Filled 2022-01-26: qty 15

## 2022-01-26 MED ORDER — LEVALBUTEROL HCL 0.63 MG/3ML IN NEBU
0.6300 mg | INHALATION_SOLUTION | Freq: Four times a day (QID) | RESPIRATORY_TRACT | Status: DC | PRN
Start: 2022-01-26 — End: 2022-01-31

## 2022-01-26 MED ORDER — ALPRAZOLAM 0.25 MG PO TABS
0.2500 mg | ORAL_TABLET | Freq: Two times a day (BID) | ORAL | Status: DC | PRN
Start: 2022-01-26 — End: 2022-01-31
  Administered 2022-01-26 – 2022-01-31 (×11): 0.25 mg via ORAL
  Filled 2022-01-26 (×11): qty 1

## 2022-01-26 MED ORDER — SODIUM CHLORIDE 0.9 % IV SOLN
2.0000 g | INTRAVENOUS | Status: DC
  Administered 2022-01-26 – 2022-01-29 (×4): 2 g via INTRAVENOUS
  Filled 2022-01-26 (×4): qty 20

## 2022-01-26 NOTE — Progress Notes (Signed)
Pt refused his breathing tx this am. No distress noted at this time.

## 2022-01-26 NOTE — Consult Note (Signed)
[facility]    Date of Admission:  01/24/2022     Reason for Consult: Strep bacteremia     Referring Physician: Dr Alfredia Ferguson  Current antibiotics: Ceftriaxone  ASSESSMENT:    61 y.o. male admitted with:  Sepsis: patient presented with high fever and encephalopathy from his SNF.  Possibility of sepsis vs drug intoxication.  CXR notable for subtle airspace opacities and he has new oxygen requirement so would query possibility of aspiration.  His left leg skin changes appear chronic in nature and less likely a source.  Additionally CT maxillofacial did not find any acute findings to suggest an odontogenic source. He does have some right orbital swelling and mild erythema that reportedly is chronic although he reports was recently worse after trying a new vape pen.  However, CT did not note any significant orbital findings either. Strep spp bacteremia: Unclear significance.  Possibly contaminant vs related to sepsis picture on admission. Acute kidney injury on CKD 3: Presented with creatinine 2.21 and increased this morning to 2.52.  Baseline appears 1.6-1.8. Type 2 DM: A1c is 7.5 back in March.  Glucose here has been acceptable. Chronic left leg lymphedema: Seen by [facility].   RECOMMENDATIONS:    Continue ceftriaxone Follow repeat blood cultures Await identification of Strep spp If repeat cultures remain negative, I think we can forgo a TEE in this scenario Wean oxygen as able Wound care Glycemic control Monitor right orbital area.  If any worsening would consider repeat imaging although this is reportedly more of a chronic issue Will follow.  Dr Candiss Norse here on Monday   Principal Problem:   Sepsis ([place])   MEDICATIONS:    Scheduled Meds:  amLODipine  10 mg Oral Daily   Chlorhexidine Gluconate Cloth  6 each Topical Daily   docusate sodium  100 mg Oral BID   fluticasone  1 spray Each Nare Daily   guaiFENesin  1,200 mg Oral BID   heparin  5,000 Units  Subcutaneous Q8H   insulin aspart  0-15 Units Subcutaneous Q4H   ipratropium  0.5 mg Nebulization Q6H   levalbuterol  0.63 mg Nebulization Q6H   loratadine  10 mg Oral Daily   metoprolol tartrate  12.5 mg Oral BID   mupirocin ointment  1 Application Nasal BID   mouth rinse  15 mL Mouth Rinse 4 times per day   oxyCODONE  30 mg Oral Q8H   oxymetazoline  1 spray Each Nare BID   pantoprazole  40 mg Oral Daily   polyethylene glycol  17 g Oral Daily   pregabalin  150 mg Oral BID   rosuvastatin  10 mg Oral QHS   senna-docusate  1 tablet Oral QHS   tamsulosin  0.4 mg Oral QPM   Continuous Infusions:  sodium chloride 10 mL/hr at 01/26/22 0519   cefTRIAXone (ROCEPHIN)  IV 2 g (01/26/22 1148)   PRN Meds:.sodium chloride, acetaminophen, alum & mag hydroxide-simeth, docusate sodium, hydrALAZINE, HYDROmorphone (DILAUDID) injection, labetalol, mouth rinse, polyethylene glycol, tiZANidine  HPI:    IZEAH VOSSLER is a 61 y.o. male with complex PMHx including melanoma, chronic pain, lymphedema, hx of epidural abscess/om, s/p right AKA, DM who presented with confusion and high fever upwards of 104.1 F on admission with unclear source of sepsis vs drug intoxication.  He was started on broad spectrum antibiotics and has overall improved after a couple days of treatment.  His blood cultures are positive in 2 of 3 bottles (1 of  2 sets) for a strep spp.  He had a TTE that was not sufficient due to poor windows and his body habitus.  Repeat blood cultures are pending but NGTD.  He has been in the ICU since admission and was transferred to [facility] this morning.  We were consulted by [facility] for further recommendations.    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)    Acquired absence of right leg below knee (HCC)    Acute osteomyelitis of calcaneum, right ([place]) 10/02/2016   Acute respiratory failure (HCC)    Amputation stump infection ([place]) 08/12/2018   Anemia    Basal cell  carcinoma, eyelid    Cancer (HCC)    Candidiasis 08/12/2018   CHF (congestive heart failure) (HCC)    chronic diastolic    Chronic back pain    Chronic kidney disease    stage 3    Chronic multifocal osteomyelitis (HCC)    of ankle and foot    Chronic pain syndrome    COPD (chronic obstructive pulmonary disease) (HCC)    DDD (degenerative disc disease), lumbar    Depression    major depressive disorder    Diabetes mellitus without complication ([place])    type 2    Diabetic ulcer of toe of left foot ([place]) 10/02/2016   Difficulty in walking, not elsewhere classified    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   Foot drop, bilateral    Foot drop, right 12/27/2015   GERD (gastroesophageal reflux disease)    Herpesviral vesicular dermatitis    Hyperlipidemia    Hyperlipidemia    Hypertension    Insomnia    Malignant melanoma of other parts of face (HCC)    MRSA bacteremia    Muscle weakness (generalized)    Nasal congestion    Necrosis of toe ([place]) 07/08/2018   Neuromuscular dysfunction of bladder    Osteomyelitis (HCC)    Osteomyelitis (HCC)    Osteomyelitis (HCC)    right BKA   Other idiopathic peripheral autonomic neuropathy    Retention of urine, unspecified    Squamous cell carcinoma of skin    Unsteadiness on feet    Urinary retention    Urinary retention    Urinary tract infection    Wears dentures    Wears glasses     Social History   Tobacco Use   Smoking status: Every Day    Packs/day: 0.50    Types: Cigarettes   Smokeless tobacco: Never   Tobacco comments:    11/19/18  Vaping Use   Vaping Use: Never used  Substance Use Topics   Alcohol use: No   Drug use: No    Family History  Problem Relation Age of Onset   Diabetes Mother    Heart disease Mother    Cancer Mother    Cancer Father    Bone cancer Father    Prostate cancer Father    Cancer Paternal Grandfather    Cancer Other    Diabetes Other     Allergies  Allergen Reactions   Latex Other (See Comments)     "ALLERGIC," per [facility]    Review of Systems  All other systems reviewed and are negative.  Except as noted above in the HPI.  OBJECTIVE:   Blood pressure (!) 122/46, pulse 76, temperature 97.6 F (36.4 C), temperature source Oral, resp. rate 14, height 6' (1.829 m), weight 114 kg, SpO2 98 %. Body mass index is 34.09  kg/m.  Physical Exam Constitutional:      General: He is not in acute distress.    Appearance: He is obese.  HENT:     Head: Normocephalic and atraumatic.  Eyes:     Extraocular Movements: Extraocular movements intact.     Comments: Right eye and lid appears swollen.  No conjunctivitis noted and EOMI.   Cardiovascular:     Rate and Rhythm: Normal rate and regular rhythm.  Pulmonary:     Effort: Pulmonary effort is normal. No respiratory distress.     Comments: On nasal cannula.  Abdominal:     General: There is no distension.     Palpations: Abdomen is soft.  Musculoskeletal:     Right lower leg: Edema present.  Skin:    General: Skin is warm and dry.     Comments: Skin changes c/w chronic lymphedema of left leg. S/P right AKA.   Neurological:     General: No focal deficit present.     Mental Status: He is alert and oriented to person, place, and time.  Psychiatric:        Mood and Affect: Mood normal.        Behavior: Behavior normal.      Lab Results: Lab Results  Component Value Date   WBC 6.7 01/26/2022   HGB 10.0 (L) 01/26/2022   HCT 33.0 (L) 01/26/2022   MCV 93.2 01/26/2022   PLT 131 (L) 01/26/2022    Lab Results  Component Value Date   NA 138 01/26/2022   K 4.4 01/26/2022   CO2 24 01/26/2022   GLUCOSE 121 (H) 01/26/2022   BUN 41 (H) 01/26/2022   CREATININE 2.52 (H) 01/26/2022   CALCIUM 8.6 (L) 01/26/2022   GFRNONAA 28 (L) 01/26/2022   GFRAA 42 (L) 02/24/2020    Lab Results  Component Value Date   ALT 40 01/26/2022   AST 54 (H) 01/26/2022   ALKPHOS 60 01/26/2022   BILITOT 0.7 01/26/2022       Component Value Date/Time    CRP 1.2 (H) 07/05/2021 0141       Component Value Date/Time   ESRSEDRATE 102 (H) 05/27/2019 0227    I have reviewed the micro and lab results in Epic.  Imaging: DG CHEST PORT 1 VIEW  Result Date: 01/26/2022 CLINICAL DATA:  Short of breath. EXAM: PORTABLE CHEST 1 VIEW COMPARISON:  01/24/2022 and older studies.  CT, 07/04/2021. FINDINGS: Cardiac silhouette is normal in size and configuration. No mediastinal or hilar masses. Mild prominence of the interstitial markings. Possible subtle airspace opacities in the right upper lung and both lower lungs, less apparent than on the recent prior exam. Lungs otherwise clear. No convincing pleural effusion.  No pneumothorax. Skeletal structures are grossly intact. IMPRESSION: 1. Possible subtle airspace opacities as described versus chronic changes accentuated by the portable technique. Mild interstitial prominence without convincing pulmonary edema. Electronically Signed   By: Lajean Manes M.D.   On: 01/26/2022 11:19   ECHOCARDIOGRAM COMPLETE  Result Date: 01/25/2022    ECHOCARDIOGRAM REPORT   Patient Name:   Gregory [name] Date of Exam: 01/25/2022 Medical Rec #:  [id number]    Height:       72.0 in Accession #:    [id number]   Weight:       251.3 lb Date of Birth:  Oct 26, 1960   BSA:          2.348 m Patient Age:    38 years     BP:  144/64 mmHg Patient Gender: M            HR:           92 bpm. Exam Location:  Inpatient Procedure: 2D Echo, Color Doppler, Cardiac Doppler and Intracardiac            Opacification Agent Indications:    R06.9 DOE  History:        Patient has prior history of Echocardiogram examinations, most                 recent 06/26/2021. CHF, Pulmonary HTN and COPD; Risk                 Factors:Hypertension, Diabetes and Dyslipidemia.  Sonographer:    Raquel Sarna Senior RDCS Referring Phys: [phone] WHITNEY D HARRIS  Sonographer Comments: Very poor echo windows due to patient body habitus and lung interference. IMPRESSIONS  1. Left ventricular  ejection fraction, by estimation, is 55 to 60%. The left ventricle has normal function. The left ventricle has no regional wall motion abnormalities. There is mild left ventricular hypertrophy. Left ventricular diastolic parameters are consistent with Grade I diastolic dysfunction (impaired relaxation).  2. Right ventricular systolic function is normal. The right ventricular size is mildly enlarged. Tricuspid regurgitation signal is inadequate for assessing PA pressure.  3. The mitral valve is grossly normal. No evidence of mitral valve regurgitation. No evidence of mitral stenosis.  4. The aortic valve was not well visualized. Aortic valve regurgitation is not visualized. Aortic valve sclerosis is present, with no evidence of aortic valve stenosis.  5. The inferior vena cava is dilated in size with <50% respiratory variability, suggesting right atrial pressure of 15 mmHg. FINDINGS  Left Ventricle: Left ventricular ejection fraction, by estimation, is 55 to 60%. The left ventricle has normal function. The left ventricle has no regional wall motion abnormalities. Definity contrast agent was given IV to delineate the left ventricular  endocardial borders. The left ventricular internal cavity size was normal in size. There is mild left ventricular hypertrophy. Left ventricular diastolic parameters are consistent with Grade I diastolic dysfunction (impaired relaxation). Right Ventricle: The right ventricular size is mildly enlarged. No increase in right ventricular wall thickness. Right ventricular systolic function is normal. Tricuspid regurgitation signal is inadequate for assessing PA pressure. Left Atrium: Left atrial size was normal in size. Right Atrium: Right atrial size was normal in size. Pericardium: There is no evidence of pericardial effusion. Mitral Valve: The mitral valve is grossly normal. No evidence of mitral valve regurgitation. No evidence of mitral valve stenosis. Tricuspid Valve: The tricuspid valve  is not well visualized. Tricuspid valve regurgitation is not demonstrated. No evidence of tricuspid stenosis. Aortic Valve: The aortic valve was not well visualized. Aortic valve regurgitation is not visualized. Aortic valve sclerosis is present, with no evidence of aortic valve stenosis. Aortic valve mean gradient measures 7.0 mmHg. Aortic valve peak gradient measures 15.1 mmHg. Aortic valve area, by VTI measures 4.21 cm. Pulmonic Valve: The pulmonic valve was not well visualized. Pulmonic valve regurgitation is not visualized. No evidence of pulmonic stenosis. Aorta: The aortic root is normal in size and structure. Venous: The inferior vena cava is dilated in size with less than 50% respiratory variability, suggesting right atrial pressure of 15 mmHg. IAS/Shunts: No atrial level shunt detected by color flow Doppler.  LEFT VENTRICLE PLAX 2D LVIDd:         4.30 cm LVIDs:         2.60 cm LV PW:  1.10 cm LV IVS:        1.20 cm LVOT diam:     2.40 cm LV SV:         116 LV SV Index:   50 LVOT Area:     4.52 cm  RIGHT VENTRICLE RV S prime:     16.50 cm/s TAPSE (M-mode): 2.3 cm LEFT ATRIUM             Index        RIGHT ATRIUM           Index LA diam:        4.10 cm 1.75 cm/m   RA Area:     21.80 cm LA Vol (A2C):   55.5 ml 23.64 ml/m  RA Volume:   60.00 ml  25.56 ml/m LA Vol (A4C):   66.7 ml 28.41 ml/m LA Biplane Vol: 61.1 ml 26.02 ml/m  AORTIC VALVE AV Area (Vmax):    3.68 cm AV Area (Vmean):   4.06 cm AV Area (VTI):     4.21 cm AV Vmax:           194.00 cm/s AV Vmean:          117.000 cm/s AV VTI:            0.276 m AV Peak Grad:      15.1 mmHg AV Mean Grad:      7.0 mmHg LVOT Vmax:         158.00 cm/s LVOT Vmean:        105.000 cm/s LVOT VTI:          0.257 m LVOT/AV VTI ratio: 0.93  AORTA Ao Root diam: 3.50 cm  SHUNTS Systemic VTI:  0.26 m Systemic Diam: 2.40 cm Cherlynn Kaiser MD Electronically signed by Cherlynn Kaiser MD Signature Date/Time: 01/25/2022/3:36:49 PM    Final    CT MAXILLOFACIAL WO  CONTRAST  Result Date: 01/24/2022 CLINICAL DATA:  Blunt facial trauma. EXAM: CT MAXILLOFACIAL WITHOUT CONTRAST TECHNIQUE: Multidetector CT imaging of the maxillofacial structures was performed. Multiplanar CT image reconstructions were also generated. RADIATION DOSE REDUCTION: This exam was performed according to the departmental dose-optimization program which includes automated exposure control, adjustment of the mA and/or kV according to patient size and/or use of iterative reconstruction technique. COMPARISON:  Three CT 03/30/2021 FINDINGS: Patient had difficulty tolerating the exam there is moderate motion artifact. Osseous: Allowing for motion, no evidence of acute fracture. No bony destructive process. Orbits: No orbital fracture. No evidence of globe injury. There may be mild proptosis. Sinuses: Trace mucosal thickening of ethmoid air cells. Small mucous retention cyst in the left maxillary sinus. No sinus fracture. Soft tissues: Right face/cheek soft tissue mass with biopsy clips. This is grossly stable from prior, although comparison is challenging due to motion on the current exam and obliquity in scan plane. Limited intracranial: Assessed on concurrent head CT, reported separately. IMPRESSION: 1. No acute facial bone fracture allowing for motion artifact. 2. Right face/cheek soft tissue mass with biopsy clips, grossly stable from prior, although comparison is challenging due to motion on the current exam and obliquity in scan plane. Electronically Signed   By: Keith Rake M.D.   On: 01/24/2022 21:03   CT HEAD WO CONTRAST (5MM)  Result Date: 01/24/2022 CLINICAL DATA:  Delirium. EXAM: CT HEAD WITHOUT CONTRAST TECHNIQUE: Contiguous axial images were obtained from the base of the skull through the vertex without intravenous contrast. RADIATION DOSE REDUCTION: This exam was performed according to the  departmental dose-optimization program which includes automated exposure control, adjustment of the  mA and/or kV according to patient size and/or use of iterative reconstruction technique. COMPARISON:  None Available. FINDINGS: Brain: Patient's head was not straight in the scanner, scanned in oblique plane. No acute intracranial hemorrhage. No evidence of acute ischemia. Normal brain volume. No hydrocephalus, the basilar cisterns are patent. There is no subdural or extra-axial collection. No evidence of mass lesion or intracranial mass. Vascular: Atherosclerosis of skullbase vasculature without hyperdense vessel or abnormal calcification. Skull: No fracture or focal lesion. Sinuses/Orbits: Assessed on concurrent face CT, reported separately Other: No scalp soft tissue abnormality. IMPRESSION: No acute intracranial abnormality. Electronically Signed   By: Keith Rake M.D.   On: 01/24/2022 20:57   DG Chest Port 1 View  Result Date: 01/24/2022 CLINICAL DATA:  Questionable sepsis - evaluate for abnormality EXAM: PORTABLE CHEST 1 VIEW COMPARISON:  September 05, 2021 FINDINGS: Study is suboptimal due to overlying question warmer blanket. Cardiopericardial silhouette is enlarged, stable. No consolidation or pleural effusion seen. Ill-defined patchy opacities at the left mid and right lower lung zone. The visualized skeletal structures are unremarkable. IMPRESSION: Mild cardiomegaly. Ill-defined opacities at the left mid lung and right lower lung suspicious for infiltrations and are more conspicuous in the present study. Electronically Signed   By: Frazier Richards M.D.   On: 01/24/2022 16:27     Imaging independently reviewed in Epic.  [facility] [phone] pager 01/26/2022, 12:14 PM

## 2022-01-26 NOTE — Consult Note (Signed)
WOC Nurse Consult Note: Reason for Consult: leg wound/lymphedema  Wound type: no open wounds Patient with chronic skin changes consistent with long standing lymphedema. lymphatic papillomatosis. No topical care required for this.  Pressure Injury POA: NA Measurement:NA Wound bed:NA Drainage (amount, consistency, odor) NA Periwound: NA Dressing procedure/placement/frequency: None, long term management of lymphedema would require compression and manual massage. With patient's extensive history of drug use and non compliance with treatment plan of the other leg/amputation site would not recommend any further treatment at this time.    Re consult if needed, will not follow at this time. Thanks  Collie Kittel [employer], RN,CWOCN, CNS, [place] [phone])

## 2022-01-26 NOTE — Progress Notes (Signed)
PROGRESS NOTE    Gregory [name]  EGB:[id number] DOB: 1961/05/24 DOA: 01/24/2022 PCP: [name], MD   Brief Narrative:  HPI per PCCM Gerald Leitz on 01/24/22 61 year old male with a past medical history significant for but not limited to congestive heart failure, COPD, chronic kidney disease stage IIIb type 2 diabetes, osteomyelitis now s/p right AKA, hypertension, hyperlipidemia, prior MRSA bacteremia, prior epidural abscess, and depression who presented to the ED from assisted living facility for reports of fever and "not acting right". Some concern for illcit drug use prior to admission.   On ED arrival patient was found to severely febrile with temperature 104.5, heart rate 115, respiratory rate 25, blood pressure 145/108.  Chest x-ray obtained and revealed mild cardiomegaly with ill-defined opacities of the left midlung and right lower lung suspicious for infiltrates.  Pertinent lab work on admission included glucose 164, BUN 30, creatinine 2.21, GFR 33, WBC 13.9.  Given acute concern for sepsis on arrival PCCM consulted for further management and admission   Of note patient was recently admitted and treated at this facility in March 2023 for multifocal pneumonia.  **Interim History Patient presented with a Tmax of 104.6 and was treated for sepsis and UDS was positive for THC and opiates.  He had 3 out of 4 blood cultures growing out Streptococcus species with sensitivities pending.  He is significantly improved from a mental status wise and was transitioned from the critical care team to the hospitalist service on 01/26/2022.  ID was consulted given his Streptococcus bacteremia and antibiotics have been de-escalated to IV ceftriaxone.  Is unclear etiology of where Streptococcus species came from but blood cultures were repeated and ID recommending repeating blood cultures.  ID feels that if the blood cultures remain negative they can forego the TEE in this scenario.  Patient has been placed  on supplemental oxygen this is weaning.  His renal function is slightly worsened and he continues to have an AKI.  Assessment and Plan: No notes have been filed under this hospital service. Service: Hospitalist  Severe sepsis due to Strep bacteremia -not sure if skin or dental source. Complicated by acute metabolic encephalopathy and AKI -can stop meropenem -He was initiated on vancomycin and we have de-escalated this to IV ceftriaxone -repeat blood cultures pending -so far 2D echo without vegetations -Unclear source of etiology so ID was consulted and given that he presented with encephalopathy and high fever could have a possibility of sepsis with concomitant drug intoxication.  Chest x-ray was subtle for airspace disease and so repeated x-ray -It is CT maxillofacial that did not find any acute odontogenic sources -ID recommends following blood cultures and continue IV ceftriaxone and if cultures remain negative foregoing a TEE in this instance and weaning oxygen as tolerated -WBC is improving from 13.9 is now 6.7 -Patient sounds extremely junky so chest x-ray will be repeated and will be placed on breathing treatments   AKI on CKD stage IIIb -Likely worsened in the setting of his Lasix administration given that he is low-dose Lasix due to hypervolemia -His baseline is around 1.6-1.8 -Patient's BUNs/creatinine went from 30/2.21 -> 33/2.18 -> 41/2.52 -Continue to monitor renal function carefully and will need to avoid nephrotoxic medications, contrast dyes, hypotension and dehydration to ensure adequate renal perfusion and possible as well as renally adjust medications -Repeat CMP in the a.m. and may need to be placed back on IV fluids   Acute metabolic encephalopathy Right orbital erythema and edema, suspected to be chronic -  presumed due to sepsis, possibly also from drug use (admits to using oral THC) -ok to resume PTA pain meds-- pharmacy has made multiple attempts to get an accurate  MAR from [place], but has been unable to verify his opiate dose. Restarting his previous dose of oxycodone '30mg'$  q8h prescribed by a physician in [place], which he endorses being his usual dose currently. -monitor -con't PTA lyrica -Diet has been advanced   History of Chronic Diastolic Congestive Heart Failure HTN, HLD  -ECHO 06/2021 with LVEF 50-55%, no RWMA, normal RVS function  -tele monitoring  -Repeat echo done showed an LVEF of 55 to 60% with normal left ventricular systolic function and left ventricular diastolic parameters are consistent with grade 1 diastolic dysfunction and normal right systolic ventricular function. -follow I/O's  -resume home crestor, norvasc, lopressor -resume amlodipine, metoprolol -hydralazine PRN -Continue to monitor for signs and symptoms of volume overload   BPH -tamsulosin   Hyperglycemia, h/o DM -SSI PRN -goal BG 140-180   Chronic anemia due to chronic illness -transfuse for Hb <7 or hemodynamically significant bleeding -Hemoglobin/hematocrit went from 11.4/36.7 on admission has now trended down to 10.0/33.0 -Continue to monitor for signs and symptoms; no overt bleeding noted -Repeat CBC in a.m.  Right leg wound and lymphedema which is chronic -WOC nurse evaluated and recommending long-term management of lymphedema with compression and manual massage as  Elevated AST -Mild and patient's AST is 54 -We will need to consider monitor and trend  Obesity -Complicates overall prognosis and care -Estimated body mass index is 34.09 kg/m as calculated from the following:   Height as of this encounter: 6' (1.829 m).   Weight as of this encounter: 114 kg.  -Weight Loss and Dietary Counseling given  DVT prophylaxis: heparin injection 5,000 Units Start: 01/24/22 2200 SCDs Start: 01/24/22 1730    Code Status: Full Code Family Communication: No family currently at bedside  Disposition Plan:  Level of care: Telemetry Status is:  Inpatient Remains inpatient appropriate because: Needs further evaluation by PT and OT and further medical improvement   Consultants:  PCCM transfer Infectious diseases  Procedures:  Echocardiogram IMPRESSIONS     1. Left ventricular ejection fraction, by estimation, is 55 to 60%. The  left ventricle has normal function. The left ventricle has no regional  wall motion abnormalities. There is mild left ventricular hypertrophy.  Left ventricular diastolic parameters  are consistent with Grade I diastolic dysfunction (impaired relaxation).   2. Right ventricular systolic function is normal. The right ventricular  size is mildly enlarged. Tricuspid regurgitation signal is inadequate for  assessing PA pressure.   3. The mitral valve is grossly normal. No evidence of mitral valve  regurgitation. No evidence of mitral stenosis.   4. The aortic valve was not well visualized. Aortic valve regurgitation  is not visualized. Aortic valve sclerosis is present, with no evidence of  aortic valve stenosis.   5. The inferior vena cava is dilated in size with <50% respiratory  variability, suggesting right atrial pressure of 15 mmHg.   FINDINGS   Left Ventricle: Left ventricular ejection fraction, by estimation, is 55  to 60%. The left ventricle has normal function. The left ventricle has no  regional wall motion abnormalities. Definity contrast agent was given IV  to delineate the left ventricular   endocardial borders. The left ventricular internal cavity size was normal  in size. There is mild left ventricular hypertrophy. Left ventricular  diastolic parameters are consistent with Grade I diastolic dysfunction  (  impaired relaxation).   Right Ventricle: The right ventricular size is mildly enlarged. No  increase in right ventricular wall thickness. Right ventricular systolic  function is normal. Tricuspid regurgitation signal is inadequate for  assessing PA pressure.   Left Atrium: Left  atrial size was normal in size.   Right Atrium: Right atrial size was normal in size.   Pericardium: There is no evidence of pericardial effusion.   Mitral Valve: The mitral valve is grossly normal. No evidence of mitral  valve regurgitation. No evidence of mitral valve stenosis.   Tricuspid Valve: The tricuspid valve is not well visualized. Tricuspid  valve regurgitation is not demonstrated. No evidence of tricuspid  stenosis.   Aortic Valve: The aortic valve was not well visualized. Aortic valve  regurgitation is not visualized. Aortic valve sclerosis is present, with  no evidence of aortic valve stenosis. Aortic valve mean gradient measures  7.0 mmHg. Aortic valve peak gradient  measures 15.1 mmHg. Aortic valve area, by VTI measures 4.21 cm.   Pulmonic Valve: The pulmonic valve was not well visualized. Pulmonic valve  regurgitation is not visualized. No evidence of pulmonic stenosis.   Aorta: The aortic root is normal in size and structure.   Venous: The inferior vena cava is dilated in size with less than 50%  respiratory variability, suggesting right atrial pressure of 15 mmHg.   IAS/Shunts: No atrial level shunt detected by color flow Doppler.      LEFT VENTRICLE  PLAX 2D  LVIDd:         4.30 cm  LVIDs:         2.60 cm  LV PW:         1.10 cm  LV IVS:        1.20 cm  LVOT diam:     2.40 cm  LV SV:         116  LV SV Index:   50  LVOT Area:     4.52 cm      RIGHT VENTRICLE  RV S prime:     16.50 cm/s  TAPSE (M-mode): 2.3 cm   LEFT ATRIUM             Index        RIGHT ATRIUM           Index  LA diam:        4.10 cm 1.75 cm/m   RA Area:     21.80 cm  LA Vol (A2C):   55.5 ml 23.64 ml/m  RA Volume:   60.00 ml  25.56 ml/m  LA Vol (A4C):   66.7 ml 28.41 ml/m  LA Biplane Vol: 61.1 ml 26.02 ml/m   AORTIC VALVE  AV Area (Vmax):    3.68 cm  AV Area (Vmean):   4.06 cm  AV Area (VTI):     4.21 cm  AV Vmax:           194.00 cm/s  AV Vmean:          117.000  cm/s  AV VTI:            0.276 m  AV Peak Grad:      15.1 mmHg  AV Mean Grad:      7.0 mmHg  LVOT Vmax:         158.00 cm/s  LVOT Vmean:        105.000 cm/s  LVOT VTI:          0.257 m  LVOT/AV VTI ratio: 0.93     AORTA  Ao Root diam: 3.50 cm      SHUNTS  Systemic VTI:  0.26 m  Systemic Diam: 2.40 cm   Antimicrobials:  Anti-infectives (From admission, onward)    Start     Dose/Rate Route Frequency Ordered Stop   01/26/22 1200  cefTRIAXone (ROCEPHIN) 2 g in sodium chloride 0.9 % 100 mL IVPB        2 g 200 mL/hr over 30 Minutes Intravenous Every 24 hours 01/26/22 1048     01/25/22 1800  vancomycin (VANCOCIN) IVPB 1000 mg/200 mL premix  Status:  Discontinued        1,000 mg 200 mL/hr over 60 Minutes Intravenous Every 24 hours 01/24/22 1743 01/26/22 1048   01/25/22 0600  ceFEPIme (MAXIPIME) 2 g in sodium chloride 0.9 % 100 mL IVPB  Status:  Discontinued        2 g 200 mL/hr over 30 Minutes Intravenous Every 12 hours 01/24/22 1743 01/24/22 1841   01/24/22 1930  meropenem (MERREM) 1 g in sodium chloride 0.9 % 100 mL IVPB  Status:  Discontinued        1 g 200 mL/hr over 30 Minutes Intravenous Every 12 hours 01/24/22 1841 01/25/22 1008   01/24/22 1715  vancomycin (VANCOREADY) IVPB 2000 mg/400 mL        2,000 mg 200 mL/hr over 120 Minutes Intravenous  Once 01/24/22 1707 01/24/22 1900   01/24/22 1700  ceFEPIme (MAXIPIME) 2 g in sodium chloride 0.9 % 100 mL IVPB        2 g 200 mL/hr over 30 Minutes Intravenous  Once 01/24/22 1656 01/24/22 1728   01/24/22 1700  metroNIDAZOLE (FLAGYL) IVPB 500 mg        500 mg 100 mL/hr over 60 Minutes Intravenous  Once 01/24/22 1656 01/24/22 1900   01/24/22 1700  vancomycin (VANCOCIN) IVPB 1000 mg/200 mL premix  Status:  Discontinued        1,000 mg 200 mL/hr over 60 Minutes Intravenous  Once 01/24/22 1656 01/24/22 1707       Subjective: Seen and examined at bedside and he was complaining of some pain in his leg.  Asking for something for sleep  as well.  Also complaining of eye itching.  No nausea or vomiting.  Denies any chest pain or shortness of breath.  Feels okay.  No other concerns or complaints this time.  Objective: Vitals:   01/26/22 0812 01/26/22 0900 01/26/22 1000 01/26/22 1355  BP: (!) 115/51 (!) 122/46 (!) 137/56 (!) 140/71  Pulse: 69 76 78 76  Resp: '14 14 17 16  '$ Temp:    97.9 F (36.6 C)  TempSrc:      SpO2: 96% 98% 97% 90%  Weight:      Height:        Intake/Output Summary (Last 24 hours) at 01/26/2022 1904 Last data filed at 01/26/2022 1713 Gross per 24 hour  Intake 349.99 ml  Output 600 ml  Net -250.01 ml   Filed Weights   01/24/22 1441 01/25/22 0500  Weight: 114 kg 114 kg   Examination: Physical Exam:  Constitutional: WN/WD obese chronically ill-appearing Caucasian male who is anxious Respiratory: Diminished to auscultation bilaterally with rhonchorous breath sounds, no wheezing, rales, or crackles. Normal respiratory effort and patient is not tachypenic. No accessory muscle use.  Wearing supplemental oxygen nasal cannula Cardiovascular: RRR, no murmurs / rubs / gallops. S1 and S2 auscultated.  Has chronic lymphedema on the right  leg with amputated toes noted Abdomen: Soft, non-tender, distended secondary to body habitus. Bowel sounds positive.  GU: Deferred. Musculoskeletal: No clubbing / cyanosis of digits/nails.  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 Neurologic: CN 2-12 grossly intact with no focal deficits. Romberg sign and cerebellar reflexes not assessed.  Psychiatric: Normal judgment and insight. Alert and oriented x 3. Normal mood and appropriate affect.   Data Reviewed: I have personally reviewed following labs and imaging studies  CBC: Recent Labs  Lab 01/24/22 1454 01/25/22 1259 01/26/22 1000  WBC 13.9* 12.8* 6.7  NEUTROABS 12.3*  --  5.2  HGB 11.4* 10.5* 10.0*  HCT 36.7* 34.8* 33.0*  MCV 90.0 93.8 93.2  PLT 212 155 131*    Basic Metabolic Panel: Recent Labs  Lab 01/24/22 1454 01/25/22 1259 01/26/22 1000  NA 137 137 138  K 5.1 4.2 4.4  CL 104 104 107  CO2 '24 24 24  '$ GLUCOSE 164* 119* 121*  BUN 30* 33* 41*  CREATININE 2.21* 2.18* 2.52*  CALCIUM 9.1 8.3* 8.6*  MG  --   --  2.1  PHOS  --   --  4.1   GFR: Estimated Creatinine Clearance: 39.6 mL/min (A) (by C-G formula based on SCr of 2.52 mg/dL (H)). Liver Function Tests: Recent Labs  Lab 01/24/22 1454 01/26/22 1000  AST 15 54*  ALT 12 40  ALKPHOS 76 60  BILITOT 0.6 0.7  PROT 7.7 6.3*  ALBUMIN 3.6 2.9*   No results for input(s): "LIPASE", "AMYLASE" in the last 168 hours. No results for input(s): "AMMONIA" in the last 168 hours. Coagulation Profile: Recent Labs  Lab 01/24/22 1454  INR 1.0   Cardiac Enzymes: Recent Labs  Lab 01/24/22 1644  CKTOTAL 207   BNP (last 3 results) No results for input(s): "PROBNP" in the last 8760 hours. HbA1C: No results for input(s): "HGBA1C" in the last 72 hours. CBG: Recent Labs  Lab 01/26/22 0333 01/26/22 0743 01/26/22 1207 01/26/22 1357 01/26/22 1636  GLUCAP 124* 116* 125* 120* 139*   Lipid Profile: No results for input(s): "CHOL", "HDL", "LDLCALC", "TRIG", "CHOLHDL", "LDLDIRECT" in the last 72 hours. Thyroid Function Tests: Recent Labs    01/24/22 1454  TSH 0.844   Anemia Panel: No results for input(s): "VITAMINB12", "FOLATE", "FERRITIN", "TIBC", "IRON", "RETICCTPCT" in the last 72 hours. Sepsis Labs: Recent Labs  Lab 01/24/22 1454 01/24/22 1644 01/24/22 1930 01/24/22 2208  LATICACIDVEN 1.6 1.0 0.6 0.6    Recent Results (from the past 240 hour(s))  Resp Panel by RT-PCR (Flu A&B, Covid) In/Out Cath Urine     Status: None   Collection Time: 01/24/22  2:52 PM   Specimen: In/Out Cath Urine; Nasal Swab  Result Value Ref Range Status   SARS Coronavirus 2 by RT PCR NEGATIVE NEGATIVE Final    Comment: (NOTE) SARS-CoV-2 target nucleic acids are NOT DETECTED.  The SARS-CoV-2 RNA  is generally detectable in upper respiratory specimens during the acute phase of infection. The lowest concentration of SARS-CoV-2 viral copies this assay can detect is 138 copies/mL. A negative result does not preclude SARS-Cov-2 infection and should not be used as the sole basis for treatment or other patient management decisions. A negative result may occur with  improper specimen collection/handling, submission of specimen other than nasopharyngeal swab, presence of viral mutation(s) within the areas targeted by this assay, and inadequate number of viral copies(<138 copies/mL). A negative result must be combined with  clinical observations, patient history, and epidemiological information. The expected result is Negative.  Fact Sheet for Patients:  EntrepreneurPulse.com.au  Fact Sheet for Healthcare Providers:  IncredibleEmployment.be  This test is no t yet approved or cleared by the [place] FDA and  has been authorized for detection and/or diagnosis of SARS-CoV-2 by FDA under an Emergency Use Authorization (EUA). This EUA will remain  in effect (meaning this test can be used) for the duration of the COVID-19 declaration under Section 564(b)(1) of the Act, 21 U.S.C.section 360bbb-3(b)(1), unless the authorization is terminated  or revoked sooner.       Influenza A by PCR NEGATIVE NEGATIVE Final   Influenza B by PCR NEGATIVE NEGATIVE Final    Comment: (NOTE) The Xpert Xpress SARS-CoV-2/FLU/RSV plus assay is intended as an aid in the diagnosis of influenza from Nasopharyngeal swab specimens and should not be used as a sole basis for treatment. Nasal washings and aspirates are unacceptable for Xpert Xpress SARS-CoV-2/FLU/RSV testing.  Fact Sheet for Patients: EntrepreneurPulse.com.au  Fact Sheet for Healthcare Providers: IncredibleEmployment.be  This test is not yet approved or cleared by the  [place] FDA and has been authorized for detection and/or diagnosis of SARS-CoV-2 by FDA under an Emergency Use Authorization (EUA). This EUA will remain in effect (meaning this test can be used) for the duration of the COVID-19 declaration under Section 564(b)(1) of the Act, 21 U.S.C. section 360bbb-3(b)(1), unless the authorization is terminated or revoked.  Performed at [facility], [place] [address] [place], [place] [address]   Urine Culture     Status: Abnormal   Collection Time: 01/24/22  2:52 PM   Specimen: In/Out Cath Urine  Result Value Ref Range Status   Specimen Description   Final    IN/OUT CATH URINE Performed at [facility] [address], [place], [place] [address]    Special Requests   Final    NONE Performed at [facility], [place] [address] [place], [place] [address]    Culture MULTIPLE SPECIES PRESENT, SUGGEST RECOLLECTION (A)  Final   Report Status 01/26/2022 FINAL  Final  Blood Culture (routine x 2)     Status: None (Preliminary result)   Collection Time: 01/24/22  2:54 PM   Specimen: BLOOD  Result Value Ref Range Status   Specimen Description   Final    BLOOD SITE NOT SPECIFIED Performed at [facility] [address], [place], [place] [address]    Special Requests   Final    BOTTLES DRAWN AEROBIC AND ANAEROBIC Blood Culture adequate volume Performed at [facility] [address], [place], [place] [address]    Culture  Setup Time   Final    GRAM POSITIVE COCCI IN CHAINS IN BOTH AEROBIC AND ANAEROBIC BOTTLES CRITICAL RESULT CALLED TO, READ BACK BY AND VERIFIED WITH:  C/ PHARMD MARY S. 01/25/22 0659 A. LAFRANCE    Culture   Final    GRAM POSITIVE COCCI IDENTIFICATION AND SUSCEPTIBILITIES TO FOLLOW Performed at [facility], [place] [address] [place], [place] [address]    Report Status PENDING  Incomplete  Blood Culture ID Panel (Reflexed)      Status: Abnormal   Collection Time: 01/24/22  2:54 PM  Result Value Ref Range Status   Enterococcus faecalis NOT DETECTED NOT DETECTED Final   Enterococcus Faecium NOT DETECTED NOT DETECTED Final   Listeria monocytogenes NOT DETECTED NOT DETECTED Final   Staphylococcus species NOT DETECTED NOT  DETECTED Final   Staphylococcus aureus (BCID) NOT DETECTED NOT DETECTED Final   Staphylococcus epidermidis NOT DETECTED NOT DETECTED Final   Staphylococcus lugdunensis NOT DETECTED NOT DETECTED Final   Streptococcus species DETECTED (A) NOT DETECTED Final    Comment: Not Enterococcus species, Streptococcus agalactiae, Streptococcus pyogenes, or Streptococcus pneumoniae. CRITICAL RESULT CALLED TO, READ BACK BY AND VERIFIED WITH:  C/ PHARMD MARY S. 01/25/22 0659 A. LAFRANCE    Streptococcus agalactiae NOT DETECTED NOT DETECTED Final   Streptococcus pneumoniae NOT DETECTED NOT DETECTED Final   Streptococcus pyogenes NOT DETECTED NOT DETECTED Final   A.calcoaceticus-baumannii NOT DETECTED NOT DETECTED Final   Bacteroides fragilis NOT DETECTED NOT DETECTED Final   Enterobacterales NOT DETECTED NOT DETECTED Final   Enterobacter cloacae complex NOT DETECTED NOT DETECTED Final   Escherichia coli NOT DETECTED NOT DETECTED Final   Klebsiella aerogenes NOT DETECTED NOT DETECTED Final   Klebsiella oxytoca NOT DETECTED NOT DETECTED Final   Klebsiella pneumoniae NOT DETECTED NOT DETECTED Final   Proteus species NOT DETECTED NOT DETECTED Final   Salmonella species NOT DETECTED NOT DETECTED Final   Serratia marcescens NOT DETECTED NOT DETECTED Final   Haemophilus influenzae NOT DETECTED NOT DETECTED Final   Neisseria meningitidis NOT DETECTED NOT DETECTED Final   Pseudomonas aeruginosa NOT DETECTED NOT DETECTED Final   Stenotrophomonas maltophilia NOT DETECTED NOT DETECTED Final   Candida albicans NOT DETECTED NOT DETECTED Final   Candida auris NOT DETECTED NOT DETECTED Final   Candida glabrata NOT DETECTED  NOT DETECTED Final   Candida krusei NOT DETECTED NOT DETECTED Final   Candida parapsilosis NOT DETECTED NOT DETECTED Final   Candida tropicalis NOT DETECTED NOT DETECTED Final   Cryptococcus neoformans/gattii NOT DETECTED NOT DETECTED Final    Comment: Performed at [facility], [address] [place], [place] [address]  Blood Culture (routine x 2)     Status: None (Preliminary result)   Collection Time: 01/24/22  7:29 PM   Specimen: BLOOD  Result Value Ref Range Status   Specimen Description   Final    BLOOD BLOOD RIGHT HAND Performed at [facility] [address] [place], [place] [address]    Special Requests   Final    IN PEDIATRIC BOTTLE Blood Culture adequate volume Performed at [facility] [address] [place], [place] [address]    Culture   Final    NO GROWTH 2 DAYS Performed at [facility] [address] [place], [place] [address]    Report Status PENDING  Incomplete  MRSA Next Gen by PCR, Nasal     Status: Abnormal   Collection Time: 01/24/22  7:36 PM   Specimen: Nasal Mucosa; Nasal Swab  Result Value Ref Range Status   MRSA by PCR Next Gen DETECTED (A) NOT DETECTED Final    Comment: CRITICAL RESULT CALLED TO, READ BACK BY AND VERIFIED WITH: ODH,T AT 2241 ON 01/24/22 BY [email or web address] (NOTE) The GeneXpert MRSA Assay (FDA approved for NASAL specimens only), is one component of a comprehensive MRSA colonization surveillance program. It is not intended to diagnose MRSA infection nor to guide or monitor treatment for MRSA infections. Test performance is not FDA approved in patients less than 85 years old. Performed at [facility], [place] [address], [place], [place] [address]   Culture, blood (Routine X 2) w Reflex to ID Panel     Status: None (Preliminary result)   Collection Time: 01/25/22  4:08 PM  Specimen: BLOOD  Result Value Ref Range Status   Specimen Description   Final    BLOOD BLOOD  RIGHT HAND Performed at [facility] [address], [place], [place] [address]    Special Requests   Final    BOTTLES DRAWN AEROBIC ONLY Blood Culture adequate volume Performed at [facility] [address] [place], [place] [address]    Culture   Final    NO GROWTH < 12 HOURS Performed at [facility] [address], [place], [place] [address]    Report Status PENDING  Incomplete  Culture, blood (Routine X 2) w Reflex to ID Panel     Status: None (Preliminary result)   Collection Time: 01/25/22  4:10 PM   Specimen: BLOOD  Result Value Ref Range Status   Specimen Description   Final    BLOOD BLOOD RIGHT WRIST Performed at [facility] [address], [place], [place] [address]    Special Requests   Final    BOTTLES DRAWN AEROBIC ONLY Blood Culture adequate volume Performed at [facility] [address] [place], [place] [address]    Culture   Final    NO GROWTH < 12 HOURS Performed at [facility] [address] [place], [place] [address]    Report Status PENDING  Incomplete    Radiology Studies: DG CHEST PORT 1 VIEW  Result Date: 01/26/2022 CLINICAL DATA:  Short of breath. EXAM: PORTABLE CHEST 1 VIEW COMPARISON:  01/24/2022 and older studies.  CT, 07/04/2021. FINDINGS: Cardiac silhouette is normal in size and configuration. No mediastinal or hilar masses. Mild prominence of the interstitial markings. Possible subtle airspace opacities in the right upper lung and both lower lungs, less apparent than on the recent prior exam. Lungs otherwise clear. No convincing pleural effusion.  No pneumothorax. Skeletal structures are grossly intact. IMPRESSION: 1. Possible subtle airspace opacities as described versus chronic changes accentuated by the portable technique. Mild interstitial prominence without convincing pulmonary edema. Electronically Signed   By: Lajean Manes M.D.   On: 01/26/2022  11:19   ECHOCARDIOGRAM COMPLETE  Result Date: 01/25/2022    ECHOCARDIOGRAM REPORT   Patient Name:   WYNSTON ROMEY Date of Exam: 01/25/2022 Medical Rec #:  [id number]    Height:       72.0 in Accession #:    [id number]   Weight:       251.3 lb Date of Birth:  [birth year]   BSA:          2.348 m Patient Age:    68 years     BP:           144/64 mmHg Patient Gender: M            HR:           92 bpm. Exam Location:  Inpatient Procedure: 2D Echo, Color Doppler, Cardiac Doppler and Intracardiac            Opacification Agent Indications:    R06.9 DOE  History:        Patient has prior history of Echocardiogram examinations, most                 recent 06/26/2021. CHF, Pulmonary HTN and COPD; Risk                 Factors:Hypertension, Diabetes and Dyslipidemia.  Sonographer:    [place] Referring Phys: [place]  HARRIS  Sonographer Comments: Very poor echo windows due to patient body habitus and lung interference. IMPRESSIONS  1. Left ventricular ejection fraction, by estimation, is 55 to 60%. The left ventricle has normal function. The left ventricle has no regional wall motion abnormalities. There is mild left ventricular hypertrophy. Left ventricular diastolic parameters are consistent with Grade I diastolic dysfunction (impaired relaxation).  2. Right ventricular systolic function is normal. The right ventricular size is mildly enlarged. Tricuspid regurgitation signal is inadequate for assessing PA pressure.  3. The mitral valve is grossly normal. No evidence of mitral valve regurgitation. No evidence of mitral stenosis.  4. The aortic valve was not well visualized. Aortic valve regurgitation is not visualized. Aortic valve sclerosis is present, with no evidence of aortic valve stenosis.  5. The inferior vena cava is dilated in size with <50% respiratory variability, suggesting right atrial pressure of 15 mmHg. FINDINGS  Left Ventricle: Left ventricular ejection fraction, by estimation, is 55 to 60%. The  left ventricle has normal function. The left ventricle has no regional wall motion abnormalities. Definity contrast agent was given IV to delineate the left ventricular  endocardial borders. The left ventricular internal cavity size was normal in size. There is mild left ventricular hypertrophy. Left ventricular diastolic parameters are consistent with Grade I diastolic dysfunction (impaired relaxation). Right Ventricle: The right ventricular size is mildly enlarged. No increase in right ventricular wall thickness. Right ventricular systolic function is normal. Tricuspid regurgitation signal is inadequate for assessing PA pressure. Left Atrium: Left atrial size was normal in size. Right Atrium: Right atrial size was normal in size. Pericardium: There is no evidence of pericardial effusion. Mitral Valve: The mitral valve is grossly normal. No evidence of mitral valve regurgitation. No evidence of mitral valve stenosis. Tricuspid Valve: The tricuspid valve is not well visualized. Tricuspid valve regurgitation is not demonstrated. No evidence of tricuspid stenosis. Aortic Valve: The aortic valve was not well visualized. Aortic valve regurgitation is not visualized. Aortic valve sclerosis is present, with no evidence of aortic valve stenosis. Aortic valve mean gradient measures 7.0 mmHg. Aortic valve peak gradient measures 15.1 mmHg. Aortic valve area, by VTI measures 4.21 cm. Pulmonic Valve: The pulmonic valve was not well visualized. Pulmonic valve regurgitation is not visualized. No evidence of pulmonic stenosis. Aorta: The aortic root is normal in size and structure. Venous: The inferior vena cava is dilated in size with less than 50% respiratory variability, suggesting right atrial pressure of 15 mmHg. IAS/Shunts: No atrial level shunt detected by color flow Doppler.  LEFT VENTRICLE PLAX 2D LVIDd:         4.30 cm LVIDs:         2.60 cm LV PW:         1.10 cm LV IVS:        1.20 cm LVOT diam:     2.40 cm LV SV:          116 LV SV Index:   50 LVOT Area:     4.52 cm  RIGHT VENTRICLE RV S prime:     16.50 cm/s TAPSE (M-mode): 2.3 cm LEFT ATRIUM             Index        RIGHT ATRIUM           Index LA diam:        4.10 cm 1.75 cm/m   RA Area:     21.80 cm LA Vol (A2C):   55.5 ml  23.64 ml/m  RA Volume:   60.00 ml  25.56 ml/m LA Vol (A4C):   66.7 ml 28.41 ml/m LA Biplane Vol: 61.1 ml 26.02 ml/m  AORTIC VALVE AV Area (Vmax):    3.68 cm AV Area (Vmean):   4.06 cm AV Area (VTI):     4.21 cm AV Vmax:           194.00 cm/s AV Vmean:          117.000 cm/s AV VTI:            0.276 m AV Peak Grad:      15.1 mmHg AV Mean Grad:      7.0 mmHg LVOT Vmax:         158.00 cm/s LVOT Vmean:        105.000 cm/s LVOT VTI:          0.257 m LVOT/AV VTI ratio: 0.93  AORTA Ao Root diam: 3.50 cm  SHUNTS Systemic VTI:  0.26 m Systemic Diam: 2.40 cm Cherlynn Kaiser MD Electronically signed by Cherlynn Kaiser MD Signature Date/Time: 01/25/2022/3:36:49 PM    Final    CT MAXILLOFACIAL WO CONTRAST  Result Date: 01/24/2022 CLINICAL DATA:  Blunt facial trauma. EXAM: CT MAXILLOFACIAL WITHOUT CONTRAST TECHNIQUE: Multidetector CT imaging of the maxillofacial structures was performed. Multiplanar CT image reconstructions were also generated. RADIATION DOSE REDUCTION: This exam was performed according to the departmental dose-optimization program which includes automated exposure control, adjustment of the mA and/or kV according to patient size and/or use of iterative reconstruction technique. COMPARISON:  Three CT 03/30/2021 FINDINGS: Patient had difficulty tolerating the exam there is moderate motion artifact. Osseous: Allowing for motion, no evidence of acute fracture. No bony destructive process. Orbits: No orbital fracture. No evidence of globe injury. There may be mild proptosis. Sinuses: Trace mucosal thickening of ethmoid air cells. Small mucous retention cyst in the left maxillary sinus. No sinus fracture. Soft tissues: Right face/cheek soft tissue  mass with biopsy clips. This is grossly stable from prior, although comparison is challenging due to motion on the current exam and obliquity in scan plane. Limited intracranial: Assessed on concurrent head CT, reported separately. IMPRESSION: 1. No acute facial bone fracture allowing for motion artifact. 2. Right face/cheek soft tissue mass with biopsy clips, grossly stable from prior, although comparison is challenging due to motion on the current exam and obliquity in scan plane. Electronically Signed   By: Keith Rake M.D.   On: 01/24/2022 21:03   CT HEAD WO CONTRAST (5MM)  Result Date: 01/24/2022 CLINICAL DATA:  Delirium. EXAM: CT HEAD WITHOUT CONTRAST TECHNIQUE: Contiguous axial images were obtained from the base of the skull through the vertex without intravenous contrast. RADIATION DOSE REDUCTION: This exam was performed according to the departmental dose-optimization program which includes automated exposure control, adjustment of the mA and/or kV according to patient size and/or use of iterative reconstruction technique. COMPARISON:  None Available. FINDINGS: Brain: Patient's head was not straight in the scanner, scanned in oblique plane. No acute intracranial hemorrhage. No evidence of acute ischemia. Normal brain volume. No hydrocephalus, the basilar cisterns are patent. There is no subdural or extra-axial collection. No evidence of mass lesion or intracranial mass. Vascular: Atherosclerosis of skullbase vasculature without hyperdense vessel or abnormal calcification. Skull: No fracture or focal lesion. Sinuses/Orbits: Assessed on concurrent face CT, reported separately Other: No scalp soft tissue abnormality. IMPRESSION: No acute intracranial abnormality. Electronically Signed   By: Keith Rake M.D.   On: 01/24/2022 20:57  Scheduled Meds:  amLODipine  10 mg Oral Daily   Chlorhexidine Gluconate Cloth  6 each Topical Daily   docusate sodium  100 mg Oral BID   fluticasone  1 spray Each  Nare Daily   guaiFENesin  1,200 mg Oral BID   heparin  5,000 Units Subcutaneous Q8H   insulin aspart  0-15 Units Subcutaneous TID AC & HS   loratadine  10 mg Oral Daily   metoprolol tartrate  12.5 mg Oral BID   mupirocin ointment  1 Application Nasal BID   mouth rinse  15 mL Mouth Rinse 4 times per day   oxyCODONE  30 mg Oral Q8H   oxymetazoline  1 spray Each Nare BID   pantoprazole  40 mg Oral Daily   polyethylene glycol  17 g Oral Daily   pregabalin  150 mg Oral BID   rosuvastatin  10 mg Oral QHS   senna-docusate  1 tablet Oral QHS   tamsulosin  0.4 mg Oral QPM   Continuous Infusions:  sodium chloride Stopped (01/26/22 1400)   cefTRIAXone (ROCEPHIN)  IV Stopped (01/26/22 1156)    LOS: 2 days   Raiford Noble, DO [facility] Available via Epic secure chat 7am-7pm After these hours, please refer to coverage provider listed on [email or web address] 01/26/2022, 7:04 PM

## 2022-01-27 ENCOUNTER — Inpatient Hospital Stay (HOSPITAL_COMMUNITY): Payer: Medicaid Other

## 2022-01-27 DIAGNOSIS — R7401 Elevation of levels of liver transaminase levels: Secondary | ICD-10-CM | POA: Diagnosis not present

## 2022-01-27 DIAGNOSIS — N179 Acute kidney failure, unspecified: Secondary | ICD-10-CM | POA: Diagnosis not present

## 2022-01-27 DIAGNOSIS — R7881 Bacteremia: Secondary | ICD-10-CM | POA: Diagnosis not present

## 2022-01-27 DIAGNOSIS — A419 Sepsis, unspecified organism: Secondary | ICD-10-CM | POA: Diagnosis not present

## 2022-01-27 DIAGNOSIS — H5789 Other specified disorders of eye and adnexa: Secondary | ICD-10-CM

## 2022-01-27 LAB — COMPREHENSIVE METABOLIC PANEL
ALT: 33 U/L (ref 0–44)
AST: 33 U/L (ref 15–41)
Albumin: 3 g/dL — ABNORMAL LOW (ref 3.5–5.0)
Alkaline Phosphatase: 57 U/L (ref 38–126)
Anion gap: 8 (ref 5–15)
BUN: 36 mg/dL — ABNORMAL HIGH (ref 8–23)
CO2: 25 mmol/L (ref 22–32)
Calcium: 8.7 mg/dL — ABNORMAL LOW (ref 8.9–10.3)
Chloride: 105 mmol/L (ref 98–111)
Creatinine, Ser: 2.02 mg/dL — ABNORMAL HIGH (ref 0.61–1.24)
GFR, Estimated: 37 mL/min — ABNORMAL LOW (ref >60)
Glucose, Bld: 126 mg/dL — ABNORMAL HIGH (ref 70–99)
Potassium: 4.7 mmol/L (ref 3.5–5.1)
Sodium: 138 mmol/L (ref 135–145)
Total Bilirubin: 0.5 mg/dL (ref 0.3–1.2)
Total Protein: 6.8 g/dL (ref 6.5–8.1)

## 2022-01-27 LAB — CULTURE, BLOOD (ROUTINE X 2): Special Requests: ADEQUATE

## 2022-01-27 LAB — CBC WITH DIFFERENTIAL/PLATELET
Abs Immature Granulocytes: 0.05 10*3/uL (ref 0.00–0.07)
Basophils Absolute: 0 10*3/uL (ref 0.0–0.1)
Basophils Relative: 0 %
Eosinophils Absolute: 0.2 10*3/uL (ref 0.0–0.5)
Eosinophils Relative: 4 %
HCT: 31.3 % — ABNORMAL LOW (ref 39.0–52.0)
Hemoglobin: 9.6 g/dL — ABNORMAL LOW (ref 13.0–17.0)
Immature Granulocytes: 1 %
Lymphocytes Relative: 19 %
Lymphs Abs: 1.2 10*3/uL (ref 0.7–4.0)
MCH: 28 pg (ref 26.0–34.0)
MCHC: 30.7 g/dL (ref 30.0–36.0)
MCV: 91.3 fL (ref 80.0–100.0)
Monocytes Absolute: 0.6 10*3/uL (ref 0.1–1.0)
Monocytes Relative: 9 %
Neutro Abs: 4.1 10*3/uL (ref 1.7–7.7)
Neutrophils Relative %: 67 %
Platelets: 141 10*3/uL — ABNORMAL LOW (ref 150–400)
RBC: 3.43 MIL/uL — ABNORMAL LOW (ref 4.22–5.81)
RDW: 15.6 % — ABNORMAL HIGH (ref 11.5–15.5)
WBC: 6.2 10*3/uL (ref 4.0–10.5)
nRBC: 0 % (ref 0.0–0.2)

## 2022-01-27 LAB — GLUCOSE, CAPILLARY
Glucose-Capillary: 117 mg/dL — ABNORMAL HIGH (ref 70–99)
Glucose-Capillary: 136 mg/dL — ABNORMAL HIGH (ref 70–99)
Glucose-Capillary: 203 mg/dL — ABNORMAL HIGH (ref 70–99)
Glucose-Capillary: 216 mg/dL — ABNORMAL HIGH (ref 70–99)

## 2022-01-27 LAB — PHOSPHORUS: Phosphorus: 4 mg/dL (ref 2.5–4.6)

## 2022-01-27 LAB — MAGNESIUM: Magnesium: 2.2 mg/dL (ref 1.7–2.4)

## 2022-01-27 MED ORDER — PREDNISOLONE ACETATE 1 % OP SUSP
1.0000 [drp] | OPHTHALMIC | Status: DC
  Administered 2022-01-27 – 2022-01-31 (×26): 1 [drp] via OPHTHALMIC
  Filled 2022-01-27: qty 5

## 2022-01-27 MED ORDER — POLYMYXIN B-TRIMETHOPRIM 10000-0.1 UNIT/ML-% OP SOLN
2.0000 [drp] | OPHTHALMIC | Status: DC
Start: 2022-01-27 — End: 2022-01-27

## 2022-01-27 MED ORDER — POLYMYXIN B-TRIMETHOPRIM 10000-0.1 UNIT/ML-% OP SOLN
1.0000 [drp] | OPHTHALMIC | Status: DC
  Administered 2022-01-27 – 2022-01-31 (×26): 1 [drp] via OPHTHALMIC
  Filled 2022-01-27: qty 10

## 2022-01-27 MED ORDER — ZOLPIDEM TARTRATE 5 MG PO TABS
5.0000 mg | ORAL_TABLET | Freq: Every evening | ORAL | Status: DC | PRN
  Administered 2022-01-30: 5 mg via ORAL
  Filled 2022-01-27: qty 1

## 2022-01-27 NOTE — Progress Notes (Signed)
Patient refused his labs this morning. We encouraged patient to be compliant and explained the importance of his Labs. Patient stated he was in too much pain to get his blood drawn at this time, and asked for the phlebotomist to come back later. Pain was addressed please see MAR. Patient was irritated while asking for his pain medication to be given at 0500 am. Also requesting for his 10:00 am Claritin.

## 2022-01-27 NOTE — Progress Notes (Signed)
PROGRESS NOTE    RUBIN DAIS  OBS:[id number] DOB: [birth year] DOA: 01/24/2022 PCP: [name], MD   Brief Narrative:  Brief Narrative:  HPI per PCCM Gerald Leitz on 01/24/22 61 year old male with a past medical history significant for but not limited to congestive heart failure, COPD, chronic kidney disease stage IIIb type 2 diabetes, osteomyelitis now s/p right AKA, hypertension, hyperlipidemia, prior MRSA bacteremia, prior epidural abscess, and depression who presented to the ED from assisted living facility for reports of fever and "not acting right". Some concern for illcit drug use prior to admission.   On ED arrival patient was found to severely febrile with temperature 104.5, heart rate 115, respiratory rate 25, blood pressure 145/108.  Chest x-ray obtained and revealed mild cardiomegaly with ill-defined opacities of the left midlung and right lower lung suspicious for infiltrates.  Pertinent lab work on admission included glucose 164, BUN 30, creatinine 2.21, GFR 33, WBC 13.9.  Given acute concern for sepsis on arrival PCCM consulted for further management and admission   Of note patient was recently admitted and treated at this facility in March 2023 for multifocal pneumonia.   **Interim History Patient presented with a Tmax of 104.6 and was treated for sepsis and UDS was positive for THC and opiates.  He had 3 out of 4 blood cultures growing out Streptococcus species with sensitivities pending.  He is significantly improved from a mental status wise and was transitioned from the critical care team to the hospitalist service on 01/26/2022.  ID was consulted given his Streptococcus bacteremia and antibiotics have been de-escalated to IV ceftriaxone.  Is unclear etiology of where Streptococcus species came from but blood cultures were repeated and ID recommending repeating blood cultures.  ID feels that if the blood cultures remain negative they can forego the TEE in this scenario.   Patient has been placed on supplemental oxygen this is weaning.  His renal function is slightly worsened and he continues to have an AKI.   Assessment and Plan:  Severe sepsis due to Strep bacteremia -not sure if skin or dental source. Complicated by acute metabolic encephalopathy and AKI -can stop meropenem -He was initiated on vancomycin and we have de-escalated this to IV ceftriaxone -repeat blood cultures pending -so far 2D echo without vegetations -Unclear source of etiology so ID was consulted and given that he presented with encephalopathy and high fever could have a possibility of sepsis with concomitant drug intoxication.  Chest x-ray was subtle for airspace disease and so repeated x-ray -It is CT maxillofacial that did not find any acute odontogenic sources -ID recommends following blood cultures and continue IV ceftriaxone and if cultures remain negative foregoing a TEE in this instance and weaning oxygen as tolerated -WBC is improving from 13.9 is now 6.2 -Patient sounds extremely junky so chest x-ray will be repeated and will be placed on breathing treatments  Right Orbital Swelling and Edema -Per PCCM note this was noted to be chronic however after discussion with the patient he states that it is worsened and itchy so we will do further work-up obtain a maxillofacial CT scan as well as order a CT scan and start Pred forte and antibiotics and may need to have ophthalmology involvement -CT Scan Orbits done and showed "Slight increase in size of soft tissue mass over the right maxilla, now measuring up to 17 mm AP diameter. This compares with 13 mm in 2021. Slight increase in skin thickening along the lateral margin of the right  eyelid and inferior eyelid. This may be related to the patient's known melanoma. Recommend direct visualization. Focal skin thickening inferiorly and medially on the right is again seen. No significant interval change" -He continues to have some swelling  erythema and slight edema in the right orbital region; denies any vision loss and states that he has had resection under his eye. -CT Scan Maxillofacial done and showed "Increased prominence of soft tissue in the upper and lower eyelids bilaterally better described on the CT orbits same day. No other acute or focal osseous abnormalities in the face" -If not improving with the steroid drops and antibiotic drops will need ophthalmology to further evaluate   AKI on CKD stage IIIb -Likely worsened in the setting of his Lasix administration given that he is low-dose Lasix due to hypervolemia -His baseline is around 1.6-1.8 -Patient's BUNs/creatinine went from 30/2.21 -> 33/2.18 -> 41/2.52 and is now trended down to 36/2.02 -Continue to monitor renal function carefully and will need to avoid nephrotoxic medications, contrast dyes, hypotension and dehydration to ensure adequate renal perfusion and possible as well as renally adjust medications -Repeat CMP in the a.m. and may need to be placed back on IV fluids   Acute metabolic encephalopathy, improving Right orbital erythema and edema, suspected to be chronic -presumed due to sepsis, possibly also from drug use (admits to using oral THC) -ok to resume PTA pain meds-- pharmacy has made multiple attempts to get an accurate MAR from [place], but has been unable to verify his opiate dose. Restarting his previous dose of oxycodone '30mg'$  q8h prescribed by a physician in [place], which he endorses being his usual dose currently. -monitor -con't PTA lyrica -Diet has been advanced   History of Chronic Diastolic Congestive Heart Failure HTN, HLD  -ECHO 06/2021 with LVEF 50-55%, no RWMA, normal RVS function  -tele monitoring  -Repeat echo done showed an LVEF of 55 to 60% with normal left ventricular systolic function and left ventricular diastolic parameters are consistent with grade 1 diastolic dysfunction and normal right systolic ventricular  function. -follow I/O's  -resume home crestor, norvasc, lopressor -resume amlodipine, metoprolol -hydralazine PRN -Continue to monitor for signs and symptoms of volume overload   BPH -tamsulosin   Hyperglycemia, h/o DM -SSI PRN -goal BG 117-203   Chronic anemia due to chronic illness -transfuse for Hb <7 or hemodynamically significant bleeding -Hemoglobin/hematocrit went from 11.4/36.7 on admission has now trended down to 10.0/33.0 yesterday and today is 9.6/31.3 -Continue to monitor for signs and symptoms; no overt bleeding noted -Repeat CBC in a.m.   Left leg wound and lymphedema which is chronic -WOC nurse evaluated and recommending long-term management of lymphedema with compression and manual massage as   Elevated AST -Mild and patient's AST is 54 and improved to 33 -We will need to consider monitor and trend   Obesity -Complicates overall prognosis and care -Estimated body mass index is 33.64 kg/m as calculated from the following:   Height as of this encounter: 6' (1.829 m).   Weight as of this encounter: 112.5 kg.  -Weight Loss and Dietary Counseling given  DVT prophylaxis: heparin injection 5,000 Units Start: 01/24/22 2200 SCDs Start: 01/24/22 1730    Code Status: Full Code Family Communication: No family currently at bedside  Disposition Plan:  Level of care: Telemetry Status is: Inpatient Remains inpatient appropriate because: We will need to ensure repeat blood cultures are negative and defer to ID for further antibiotic treatment and regimen    Consultants:  PCCM  transfer Infectious diseases  Procedures:  Echocardiogram IMPRESSIONS     1. Left ventricular ejection fraction, by estimation, is 55 to 60%. The  left ventricle has normal function. The left ventricle has no regional  wall motion abnormalities. There is mild left ventricular hypertrophy.  Left ventricular diastolic parameters  are consistent with Grade I diastolic dysfunction (impaired  relaxation).   2. Right ventricular systolic function is normal. The right ventricular  size is mildly enlarged. Tricuspid regurgitation signal is inadequate for  assessing PA pressure.   3. The mitral valve is grossly normal. No evidence of mitral valve  regurgitation. No evidence of mitral stenosis.   4. The aortic valve was not well visualized. Aortic valve regurgitation  is not visualized. Aortic valve sclerosis is present, with no evidence of  aortic valve stenosis.   5. The inferior vena cava is dilated in size with <50% respiratory  variability, suggesting right atrial pressure of 15 mmHg.   FINDINGS   Left Ventricle: Left ventricular ejection fraction, by estimation, is 55  to 60%. The left ventricle has normal function. The left ventricle has no  regional wall motion abnormalities. Definity contrast agent was given IV  to delineate the left ventricular   endocardial borders. The left ventricular internal cavity size was normal  in size. There is mild left ventricular hypertrophy. Left ventricular  diastolic parameters are consistent with Grade I diastolic dysfunction  (impaired relaxation).   Right Ventricle: The right ventricular size is mildly enlarged. No  increase in right ventricular wall thickness. Right ventricular systolic  function is normal. Tricuspid regurgitation signal is inadequate for  assessing PA pressure.   Left Atrium: Left atrial size was normal in size.   Right Atrium: Right atrial size was normal in size.   Pericardium: There is no evidence of pericardial effusion.   Mitral Valve: The mitral valve is grossly normal. No evidence of mitral  valve regurgitation. No evidence of mitral valve stenosis.   Tricuspid Valve: The tricuspid valve is not well visualized. Tricuspid  valve regurgitation is not demonstrated. No evidence of tricuspid  stenosis.   Aortic Valve: The aortic valve was not well visualized. Aortic valve  regurgitation is not  visualized. Aortic valve sclerosis is present, with  no evidence of aortic valve stenosis. Aortic valve mean gradient measures  7.0 mmHg. Aortic valve peak gradient  measures 15.1 mmHg. Aortic valve area, by VTI measures 4.21 cm.   Pulmonic Valve: The pulmonic valve was not well visualized. Pulmonic valve  regurgitation is not visualized. No evidence of pulmonic stenosis.   Aorta: The aortic root is normal in size and structure.   Venous: The inferior vena cava is dilated in size with less than 50%  respiratory variability, suggesting right atrial pressure of 15 mmHg.   IAS/Shunts: No atrial level shunt detected by color flow Doppler.      LEFT VENTRICLE  PLAX 2D  LVIDd:         4.30 cm  LVIDs:         2.60 cm  LV PW:         1.10 cm  LV IVS:        1.20 cm  LVOT diam:     2.40 cm  LV SV:         116  LV SV Index:   50  LVOT Area:     4.52 cm      RIGHT VENTRICLE  RV S prime:     16.50  cm/s  TAPSE (M-mode): 2.3 cm   LEFT ATRIUM             Index        RIGHT ATRIUM           Index  LA diam:        4.10 cm 1.75 cm/m   RA Area:     21.80 cm  LA Vol (A2C):   55.5 ml 23.64 ml/m  RA Volume:   60.00 ml  25.56 ml/m  LA Vol (A4C):   66.7 ml 28.41 ml/m  LA Biplane Vol: 61.1 ml 26.02 ml/m   AORTIC VALVE  AV Area (Vmax):    3.68 cm  AV Area (Vmean):   4.06 cm  AV Area (VTI):     4.21 cm  AV Vmax:           194.00 cm/s  AV Vmean:          117.000 cm/s  AV VTI:            0.276 m  AV Peak Grad:      15.1 mmHg  AV Mean Grad:      7.0 mmHg  LVOT Vmax:         158.00 cm/s  LVOT Vmean:        105.000 cm/s  LVOT VTI:          0.257 m  LVOT/AV VTI ratio: 0.93     AORTA  Ao Root diam: 3.50 cm      SHUNTS  Systemic VTI:  0.26 m  Systemic Diam: 2.40 cm     Antimicrobials:  Anti-infectives (From admission, onward)    Start     Dose/Rate Route Frequency Ordered Stop   01/26/22 1200  cefTRIAXone (ROCEPHIN) 2 g in sodium chloride 0.9 % 100 mL IVPB        2 g 200  mL/hr over 30 Minutes Intravenous Every 24 hours 01/26/22 1048     01/25/22 1800  vancomycin (VANCOCIN) IVPB 1000 mg/200 mL premix  Status:  Discontinued        1,000 mg 200 mL/hr over 60 Minutes Intravenous Every 24 hours 01/24/22 1743 01/26/22 1048   01/25/22 0600  ceFEPIme (MAXIPIME) 2 g in sodium chloride 0.9 % 100 mL IVPB  Status:  Discontinued        2 g 200 mL/hr over 30 Minutes Intravenous Every 12 hours 01/24/22 1743 01/24/22 1841   01/24/22 1930  meropenem (MERREM) 1 g in sodium chloride 0.9 % 100 mL IVPB  Status:  Discontinued        1 g 200 mL/hr over 30 Minutes Intravenous Every 12 hours 01/24/22 1841 01/25/22 1008   01/24/22 1715  vancomycin (VANCOREADY) IVPB 2000 mg/400 mL        2,000 mg 200 mL/hr over 120 Minutes Intravenous  Once 01/24/22 1707 01/24/22 1900   01/24/22 1700  ceFEPIme (MAXIPIME) 2 g in sodium chloride 0.9 % 100 mL IVPB        2 g 200 mL/hr over 30 Minutes Intravenous  Once 01/24/22 1656 01/24/22 1728   01/24/22 1700  metroNIDAZOLE (FLAGYL) IVPB 500 mg        500 mg 100 mL/hr over 60 Minutes Intravenous  Once 01/24/22 1656 01/24/22 1900   01/24/22 1700  vancomycin (VANCOCIN) IVPB 1000 mg/200 mL premix  Status:  Discontinued        1,000 mg 200 mL/hr over 60 Minutes Intravenous  Once 01/24/22 1656 01/24/22 1707  Subjective: Seen and examined at bedside he states that his eye was itching more.  Thinks his pain is getting a little bit better.  No nausea or vomiting.  Felt okay otherwise.  No chest pain or shortness of breath.  Objective: Vitals:   01/26/22 1944 01/27/22 0427 01/27/22 0500 01/27/22 1413  BP: (!) 148/71 (!) 144/83  (!) 152/68  Pulse: 69 65  65  Resp: '18 20  18  '$ Temp: 98 F (36.7 C) 98.6 F (37 C)  98.5 F (36.9 C)  TempSrc: Oral Oral  Oral  SpO2: 97% 94%  95%  Weight:   112.5 kg   Height:        Intake/Output Summary (Last 24 hours) at 01/27/2022 1642 Last data filed at 01/27/2022 1615 Gross per 24 hour  Intake 580 ml   Output 5800 ml  Net -5220 ml   Filed Weights   01/24/22 1441 01/25/22 0500 01/27/22 0500  Weight: 114 kg 114 kg 112.5 kg   Examination: Physical Exam:  Constitutional: Well-nourished, well-developed obese chronically ill-appearing Caucasian male currently who is resting Eyes: Has right orbital edema and erythema with scar noted from prior resection Respiratory: Diminished to auscultation bilaterally, no wheezing, rales, rhonchi or crackles. Normal respiratory effort and patient is not tachypenic. No accessory muscle use.  Unlabored breathing Cardiovascular: RRR, no murmurs / rubs / gallops. S1 and S2 auscultated. No extremity edema.  Abdomen: Soft, non-tender, distended secondary by habitus. Bowel sounds positive.  GU: Deferred. Musculoskeletal: Left leg skin changes and amputations noted and has a right BKA and some amputated toes of the left foot.  Neurologic: CN 2-12 grossly intact with no focal deficits.  Has right eye orbital swelling.  Romberg sign and cerebellar reflexes were not assessed Psychiatric: Normal judgment and insight. Alert and oriented x 3.  Anxious mood  Data Reviewed: I have personally reviewed following labs and imaging studies  CBC: Recent Labs  Lab 01/24/22 1454 01/25/22 1259 01/26/22 1000 01/27/22 0831  WBC 13.9* 12.8* 6.7 6.2  NEUTROABS 12.3*  --  5.2 4.1  HGB 11.4* 10.5* 10.0* 9.6*  HCT 36.7* 34.8* 33.0* 31.3*  MCV 90.0 93.8 93.2 91.3  PLT 212 155 131* 400*   Basic Metabolic Panel: Recent Labs  Lab 01/24/22 1454 01/25/22 1259 01/26/22 1000 01/27/22 0831  NA 137 137 138 138  K 5.1 4.2 4.4 4.7  CL 104 104 107 105  CO2 '24 24 24 25  '$ GLUCOSE 164* 119* 121* 126*  BUN 30* 33* 41* 36*  CREATININE 2.21* 2.18* 2.52* 2.02*  CALCIUM 9.1 8.3* 8.6* 8.7*  MG  --   --  2.1 2.2  PHOS  --   --  4.1 4.0   GFR: Estimated Creatinine Clearance: 49.1 mL/min (A) (by C-G formula based on SCr of 2.02 mg/dL (H)). Liver Function Tests: Recent Labs  Lab  01/24/22 1454 01/26/22 1000 01/27/22 0831  AST 15 54* 33  ALT 12 40 33  ALKPHOS 76 60 57  BILITOT 0.6 0.7 0.5  PROT 7.7 6.3* 6.8  ALBUMIN 3.6 2.9* 3.0*   No results for input(s): "LIPASE", "AMYLASE" in the last 168 hours. No results for input(s): "AMMONIA" in the last 168 hours. Coagulation Profile: Recent Labs  Lab 01/24/22 1454  INR 1.0   Cardiac Enzymes: Recent Labs  Lab 01/24/22 1644  CKTOTAL 207   BNP (last 3 results) No results for input(s): "PROBNP" in the last 8760 hours. HbA1C: No results for input(s): "HGBA1C" in the last 72 hours.  CBG: Recent Labs  Lab 01/26/22 1357 01/26/22 1636 01/26/22 2159 01/27/22 0745 01/27/22 1137  GLUCAP 120* 139* 158* 117* 136*   Lipid Profile: No results for input(s): "CHOL", "HDL", "LDLCALC", "TRIG", "CHOLHDL", "LDLDIRECT" in the last 72 hours. Thyroid Function Tests: No results for input(s): "TSH", "T4TOTAL", "FREET4", "T3FREE", "THYROIDAB" in the last 72 hours. Anemia Panel: No results for input(s): "VITAMINB12", "FOLATE", "FERRITIN", "TIBC", "IRON", "RETICCTPCT" in the last 72 hours. Sepsis Labs: Recent Labs  Lab 01/24/22 1454 01/24/22 1644 01/24/22 1930 01/24/22 2208  LATICACIDVEN 1.6 1.0 0.6 0.6    Recent Results (from the past 240 hour(s))  Resp Panel by RT-PCR (Flu A&B, Covid) In/Out Cath Urine     Status: None   Collection Time: 01/24/22  2:52 PM   Specimen: In/Out Cath Urine; Nasal Swab  Result Value Ref Range Status   SARS Coronavirus 2 by RT PCR NEGATIVE NEGATIVE Final    Comment: (NOTE) SARS-CoV-2 target nucleic acids are NOT DETECTED.  The SARS-CoV-2 RNA is generally detectable in upper respiratory specimens during the acute phase of infection. The lowest concentration of SARS-CoV-2 viral copies this assay can detect is 138 copies/mL. A negative result does not preclude SARS-Cov-2 infection and should not be used as the sole basis for treatment or other patient management decisions. A negative  result may occur with  improper specimen collection/handling, submission of specimen other than nasopharyngeal swab, presence of viral mutation(s) within the areas targeted by this assay, and inadequate number of viral copies(<138 copies/mL). A negative result must be combined with clinical observations, patient history, and epidemiological information. The expected result is Negative.  Fact Sheet for Patients:  EntrepreneurPulse.com.au  Fact Sheet for Healthcare Providers:  IncredibleEmployment.be  This test is no t yet approved or cleared by the [place] FDA and  has been authorized for detection and/or diagnosis of SARS-CoV-2 by FDA under an Emergency Use Authorization (EUA). This EUA will remain  in effect (meaning this test can be used) for the duration of the COVID-19 declaration under Section 564(b)(1) of the Act, 21 U.S.C.section 360bbb-3(b)(1), unless the authorization is terminated  or revoked sooner.       Influenza A by PCR NEGATIVE NEGATIVE Final   Influenza B by PCR NEGATIVE NEGATIVE Final    Comment: (NOTE) The Xpert Xpress SARS-CoV-2/FLU/RSV plus assay is intended as an aid in the diagnosis of influenza from Nasopharyngeal swab specimens and should not be used as a sole basis for treatment. Nasal washings and aspirates are unacceptable for Xpert Xpress SARS-CoV-2/FLU/RSV testing.  Fact Sheet for Patients: EntrepreneurPulse.com.au  Fact Sheet for Healthcare Providers: IncredibleEmployment.be  This test is not yet approved or cleared by the [place] FDA and has been authorized for detection and/or diagnosis of SARS-CoV-2 by FDA under an Emergency Use Authorization (EUA). This EUA will remain in effect (meaning this test can be used) for the duration of the COVID-19 declaration under Section 564(b)(1) of the Act, 21 U.S.C. section 360bbb-3(b)(1), unless the authorization is  terminated or revoked.  Performed at [facility], [place] [address], [place], [place] [address]   Urine Culture     Status: Abnormal   Collection Time: 01/24/22  2:52 PM   Specimen: In/Out Cath Urine  Result Value Ref Range Status   Specimen Description   Final    IN/OUT CATH URINE Performed at [facility] [address] [place], [place] [address]    Special Requests   Final    NONE Performed at [facility]  [place] [address] [place], [place] [address]    Culture MULTIPLE SPECIES PRESENT, SUGGEST RECOLLECTION (A)  Final   Report Status 01/26/2022 FINAL  Final  Blood Culture (routine x 2)     Status: Abnormal   Collection Time: 01/24/22  2:54 PM   Specimen: BLOOD  Result Value Ref Range Status   Specimen Description   Final    BLOOD SITE NOT SPECIFIED Performed at [facility] [address], [place], [place] [address]    Special Requests   Final    BOTTLES DRAWN AEROBIC AND ANAEROBIC Blood Culture adequate volume Performed at [facility] [address], [place],  [address]    Culture  Setup Time   Final    GRAM POSITIVE COCCI IN CHAINS IN BOTH AEROBIC AND ANAEROBIC BOTTLES CRITICAL RESULT CALLED TO, READ BACK BY AND VERIFIED WITH:  C/ PHARMD MARY S. 01/25/22 0659 A. LAFRANCE Performed at [facility], [place] [address] [place], [place] [address]    Culture STREPTOCOCCUS GROUP G (A)  Final   Report Status 01/27/2022 FINAL  Final   Organism ID, Bacteria STREPTOCOCCUS GROUP G  Final      Susceptibility   Streptococcus group g - MIC*    CLINDAMYCIN >=1 RESISTANT Resistant     AMPICILLIN <=0.25 SENSITIVE Sensitive     ERYTHROMYCIN >=8 RESISTANT Resistant     VANCOMYCIN 0.5 SENSITIVE Sensitive     CEFTRIAXONE <=0.12 SENSITIVE Sensitive     LEVOFLOXACIN 0.5 SENSITIVE Sensitive     * STREPTOCOCCUS GROUP G  Blood Culture ID Panel (Reflexed)     Status: Abnormal    Collection Time: 01/24/22  2:54 PM  Result Value Ref Range Status   Enterococcus faecalis NOT DETECTED NOT DETECTED Final   Enterococcus Faecium NOT DETECTED NOT DETECTED Final   Listeria monocytogenes NOT DETECTED NOT DETECTED Final   Staphylococcus species NOT DETECTED NOT DETECTED Final   Staphylococcus aureus (BCID) NOT DETECTED NOT DETECTED Final   Staphylococcus epidermidis NOT DETECTED NOT DETECTED Final   Staphylococcus lugdunensis NOT DETECTED NOT DETECTED Final   Streptococcus species DETECTED (A) NOT DETECTED Final    Comment: Not Enterococcus species, Streptococcus agalactiae, Streptococcus pyogenes, or Streptococcus pneumoniae. CRITICAL RESULT CALLED TO, READ BACK BY AND VERIFIED WITH:  C/ PHARMD MARY S. 01/25/22 0659 A. LAFRANCE    Streptococcus agalactiae NOT DETECTED NOT DETECTED Final   Streptococcus pneumoniae NOT DETECTED NOT DETECTED Final   Streptococcus pyogenes NOT DETECTED NOT DETECTED Final   A.calcoaceticus-baumannii NOT DETECTED NOT DETECTED Final   Bacteroides fragilis NOT DETECTED NOT DETECTED Final   Enterobacterales NOT DETECTED NOT DETECTED Final   Enterobacter cloacae complex NOT DETECTED NOT DETECTED Final   Escherichia coli NOT DETECTED NOT DETECTED Final   Klebsiella aerogenes NOT DETECTED NOT DETECTED Final   Klebsiella oxytoca NOT DETECTED NOT DETECTED Final   Klebsiella pneumoniae NOT DETECTED NOT DETECTED Final   Proteus species NOT DETECTED NOT DETECTED Final   Salmonella species NOT DETECTED NOT DETECTED Final   Serratia marcescens NOT DETECTED NOT DETECTED Final   Haemophilus influenzae NOT DETECTED NOT DETECTED Final   Neisseria meningitidis NOT DETECTED NOT DETECTED Final   Pseudomonas aeruginosa NOT DETECTED NOT DETECTED Final   Stenotrophomonas maltophilia NOT DETECTED NOT DETECTED Final   Candida albicans NOT DETECTED NOT DETECTED Final   Candida auris NOT DETECTED NOT DETECTED Final   Candida glabrata NOT DETECTED NOT DETECTED Final    Candida krusei NOT  DETECTED NOT DETECTED Final   Candida parapsilosis NOT DETECTED NOT DETECTED Final   Candida tropicalis NOT DETECTED NOT DETECTED Final   Cryptococcus neoformans/gattii NOT DETECTED NOT DETECTED Final    Comment: Performed at [facility], [place] [address], [place], [place] [address]  Blood Culture (routine x 2)     Status: None (Preliminary result)   Collection Time: 01/24/22  7:29 PM   Specimen: BLOOD  Result Value Ref Range Status   Specimen Description   Final    BLOOD BLOOD RIGHT HAND Performed at [facility] [address], [place], [place] [address]    Special Requests   Final    IN PEDIATRIC BOTTLE Blood Culture adequate volume Performed at [facility] [address] [place], [place] [address]    Culture   Final    NO GROWTH 3 DAYS Performed at [facility], [place] [address] [place], [place] [address]    Report Status PENDING  Incomplete  MRSA Next Gen by PCR, Nasal     Status: Abnormal   Collection Time: 01/24/22  7:36 PM   Specimen: Nasal Mucosa; Nasal Swab  Result Value Ref Range Status   MRSA by PCR Next Gen DETECTED (A) NOT DETECTED Final    Comment: CRITICAL RESULT CALLED TO, READ BACK BY AND VERIFIED WITH: ODH,T AT 2241 ON 01/24/22 BY [email or web address] (NOTE) The GeneXpert MRSA Assay (FDA approved for NASAL specimens only), is one component of a comprehensive MRSA colonization surveillance program. It is not intended to diagnose MRSA infection nor to guide or monitor treatment for MRSA infections. Test performance is not FDA approved in patients less than 51 years old. Performed at [facility], [place] [address], [place], [place] [address]   Culture, blood (Routine X 2) w Reflex to ID Panel     Status: None (Preliminary result)   Collection Time: 01/25/22  4:08 PM   Specimen: BLOOD  Result Value Ref Range Status   Specimen Description   Final    BLOOD BLOOD RIGHT HAND Performed  at [facility] [address], [place], [place] [address]    Special Requests   Final    BOTTLES DRAWN AEROBIC ONLY Blood Culture adequate volume Performed at [facility] [address], [place], [place] [address]    Culture   Final    NO GROWTH 2 DAYS Performed at [facility] [address] [place], [place] [address]    Report Status PENDING  Incomplete  Culture, blood (Routine X 2) w Reflex to ID Panel     Status: None (Preliminary result)   Collection Time: 01/25/22  4:10 PM   Specimen: BLOOD  Result Value Ref Range Status   Specimen Description   Final    BLOOD BLOOD RIGHT WRIST Performed at [facility] [address], [place], [place] [address]    Special Requests   Final    BOTTLES DRAWN AEROBIC ONLY Blood Culture adequate volume Performed at [facility] [address] [place], [place] [address]    Culture   Final    NO GROWTH 2 DAYS Performed at [facility] [address], [name], [place] [address]    Report Status PENDING  Incomplete     Radiology Studies: CT MAXILLOFACIAL WO CONTRAST  Result Date: 01/27/2022 CLINICAL DATA:  Melanoma dilated face. Face and eye swelling. EXAM: CT MAXILLOFACIAL WITHOUT CONTRAST TECHNIQUE: Multidetector CT  imaging of the maxillofacial structures was performed. Multiplanar CT image reconstructions were also generated. RADIATION DOSE REDUCTION: This exam was performed according to the departmental dose-optimization program which includes automated exposure control, adjustment of the mA and/or kV according to patient size and/or use of iterative reconstruction technique. COMPARISON:  CT orbits without contrast of the same day. CT maxillofacial without contrast 01/24/2022 and 03/30/2021. CT maxillofacial 11/12/2019 FINDINGS: Osseous: No acute or focal osseous abnormalities are present in the face. Mandible is intact and located. Patient is  edentulous. Cervical spine fusion noted. Orbits: Increased prominence of soft tissue in the upper and lower eyelids better described on the CT orbits same day. Globes are within normal limits. Postseptal orbit normal on the right. The left globe and orbit are normal. Sinuses: The paranasal sinuses and mastoid air cells are clear. Soft tissues: The more inferior soft tissues the face below the orbits are unremarkable. Periorbital findings described on the orbit CT of the same day. Limited intracranial: Unremarkable IMPRESSION: 1. Increased prominence of soft tissue in the upper and lower eyelids bilaterally better described on the CT orbits same day. 2. No other acute or focal osseous abnormalities in the face. Electronically Signed   By: San Morelle M.D.   On: 01/27/2022 13:19   CT ORBITS WO CONTRAST  Result Date: 01/27/2022 CLINICAL DATA:  Renal disorder. Right eye swelling. 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 EXAM: CT ORBITS WITHOUT CONTRAST TECHNIQUE: Multidetector CT imaging of the orbits was performed using the standard protocol without intravenous contrast. Multiplanar CT image reconstructions were also generated. RADIATION DOSE REDUCTION: This exam was performed according to the departmental dose-optimization program which includes automated exposure control, adjustment of the mA and/or kV according to patient size and/or use of iterative reconstruction technique. COMPARISON:  CT head and maxillofacial CT 01/24/22. CT of the maxillofacial with contrast 11/12/2019 FINDINGS: Orbits: Skin thickening in the scratched at skin thickening is again noted in the right upper eyelid. This is increased along the lateral margin of the eyelid. The inferior eyelid also slightly more prominent than on the prior exam extending more lateral than previously noted. Visible paranasal sinuses: The paranasal sinuses and mastoid air cells are clear. Soft tissues: Soft tissue mass over  the right maxilla is increased in thickness since the prior exam. It now measures up to 17 mm AP diameter at the level of the most medial clip. This compares with 13 mm in 2021. Additional lateral stranding is evident. Focal skin thickening inferiorly and medially again seen on the right. Osseous: No focal osseous abnormalities are present. Limited intracranial: Within normal limits. IMPRESSION: 1. Slight increase in size of soft tissue mass over the right maxilla, now measuring up to 17 mm AP diameter. This compares with 13 mm in 2021. 2. Slight increase in skin thickening along the lateral margin of the right eyelid and inferior eyelid. This may be related to the patient's known melanoma. Recommend direct visualization. 3. Focal skin thickening inferiorly and medially on the right is again seen. 4. No significant interval change. Electronically Signed   By: San Morelle M.D.   On: 01/27/2022 13:17   DG CHEST PORT 1 VIEW  Result Date: 01/27/2022 CLINICAL DATA:  Shortness of breath EXAM: PORTABLE CHEST 1 VIEW COMPARISON:  None Available. FINDINGS: The left lung remains clear. The cardiomediastinal silhouette is stable. Persistent mild opacity in the right lung. No other changes. IMPRESSION: Persistent mild opacity in the right lung. Recommend attention  on follow-up. No other change. Electronically Signed   By: Dorise Bullion III M.D.   On: 01/27/2022 08:36   DG CHEST PORT 1 VIEW  Result Date: 01/26/2022 CLINICAL DATA:  Short of breath. EXAM: PORTABLE CHEST 1 VIEW COMPARISON:  01/24/2022 and older studies.  CT, 07/04/2021. FINDINGS: Cardiac silhouette is normal in size and configuration. No mediastinal or hilar masses. Mild prominence of the interstitial markings. Possible subtle airspace opacities in the right upper lung and both lower lungs, less apparent than on the recent prior exam. Lungs otherwise clear. No convincing pleural effusion.  No pneumothorax. Skeletal structures are grossly intact.  IMPRESSION: 1. Possible subtle airspace opacities as described versus chronic changes accentuated by the portable technique. Mild interstitial prominence without convincing pulmonary edema. Electronically Signed   By: Lajean Manes M.D.   On: 01/26/2022 11:19    Scheduled Meds:  amLODipine  10 mg Oral Daily   Chlorhexidine Gluconate Cloth  6 each Topical Daily   docusate sodium  100 mg Oral BID   fluticasone  1 spray Each Nare Daily   guaiFENesin  1,200 mg Oral BID   heparin  5,000 Units Subcutaneous Q8H   insulin aspart  0-15 Units Subcutaneous TID AC & HS   loratadine  10 mg Oral Daily   metoprolol tartrate  12.5 mg Oral BID   mupirocin ointment  1 Application Nasal BID   mouth rinse  15 mL Mouth Rinse 4 times per day   oxyCODONE  30 mg Oral Q8H   oxymetazoline  1 spray Each Nare BID   pantoprazole  40 mg Oral Daily   polyethylene glycol  17 g Oral Daily   prednisoLONE acetate  1 drop Right Eye Q4H   pregabalin  150 mg Oral BID   rosuvastatin  10 mg Oral QHS   senna-docusate  1 tablet Oral QHS   tamsulosin  0.4 mg Oral QPM   trimethoprim-polymyxin b  1 drop Right Eye Q4H   Continuous Infusions:  sodium chloride Stopped (01/26/22 1400)   cefTRIAXone (ROCEPHIN)  IV 2 g (01/27/22 1224)    LOS: 3 days   Raiford Noble, DO [facility] Available via Epic secure chat 7am-7pm After these hours, please refer to coverage provider listed on [email or web address] 01/27/2022, 4:42 PM

## 2022-01-27 NOTE — Plan of Care (Signed)

## 2022-01-27 NOTE — Progress Notes (Signed)
PT Cancellation Note  Patient Details Name: Gregory [name] MRN: [id number] DOB: [birth year]   Cancelled Treatment:    Reason Eval/Treat Not Completed: Patient declined, no reason specified (pt states "Not right now, my legs are throbbing. We can try later." will follow up at later date/time as schedule allows.)  Gwynneth Albright PT, DPT Acute Rehabilitation Services Office [phone] Pager [phone]  01/27/22 8:52 AM

## 2022-01-28 DIAGNOSIS — A419 Sepsis, unspecified organism: Secondary | ICD-10-CM | POA: Diagnosis not present

## 2022-01-28 DIAGNOSIS — N179 Acute kidney failure, unspecified: Secondary | ICD-10-CM | POA: Diagnosis not present

## 2022-01-28 DIAGNOSIS — R6520 Severe sepsis without septic shock: Secondary | ICD-10-CM | POA: Diagnosis not present

## 2022-01-28 LAB — CBC WITH DIFFERENTIAL/PLATELET
Abs Immature Granulocytes: 0.08 10*3/uL — ABNORMAL HIGH (ref 0.00–0.07)
Basophils Absolute: 0 10*3/uL (ref 0.0–0.1)
Basophils Relative: 0 %
Eosinophils Absolute: 0.2 10*3/uL (ref 0.0–0.5)
Eosinophils Relative: 4 %
HCT: 32.5 % — ABNORMAL LOW (ref 39.0–52.0)
Hemoglobin: 10.1 g/dL — ABNORMAL LOW (ref 13.0–17.0)
Immature Granulocytes: 1 %
Lymphocytes Relative: 23 %
Lymphs Abs: 1.3 10*3/uL (ref 0.7–4.0)
MCH: 28.5 pg (ref 26.0–34.0)
MCHC: 31.1 g/dL (ref 30.0–36.0)
MCV: 91.5 fL (ref 80.0–100.0)
Monocytes Absolute: 0.4 10*3/uL (ref 0.1–1.0)
Monocytes Relative: 7 %
Neutro Abs: 3.6 10*3/uL (ref 1.7–7.7)
Neutrophils Relative %: 65 %
Platelets: 144 10*3/uL — ABNORMAL LOW (ref 150–400)
RBC: 3.55 MIL/uL — ABNORMAL LOW (ref 4.22–5.81)
RDW: 15.1 % (ref 11.5–15.5)
WBC: 5.6 10*3/uL (ref 4.0–10.5)
nRBC: 0 % (ref 0.0–0.2)

## 2022-01-28 LAB — COMPREHENSIVE METABOLIC PANEL
ALT: 24 U/L (ref 0–44)
AST: 20 U/L (ref 15–41)
Albumin: 2.9 g/dL — ABNORMAL LOW (ref 3.5–5.0)
Alkaline Phosphatase: 57 U/L (ref 38–126)
Anion gap: 8 (ref 5–15)
BUN: 29 mg/dL — ABNORMAL HIGH (ref 8–23)
CO2: 25 mmol/L (ref 22–32)
Calcium: 8.7 mg/dL — ABNORMAL LOW (ref 8.9–10.3)
Chloride: 109 mmol/L (ref 98–111)
Creatinine, Ser: 1.75 mg/dL — ABNORMAL HIGH (ref 0.61–1.24)
GFR, Estimated: 43 mL/min — ABNORMAL LOW (ref >60)
Glucose, Bld: 167 mg/dL — ABNORMAL HIGH (ref 70–99)
Potassium: 4.4 mmol/L (ref 3.5–5.1)
Sodium: 142 mmol/L (ref 135–145)
Total Bilirubin: 0.4 mg/dL (ref 0.3–1.2)
Total Protein: 6.4 g/dL — ABNORMAL LOW (ref 6.5–8.1)

## 2022-01-28 LAB — GLUCOSE, CAPILLARY
Glucose-Capillary: 164 mg/dL — ABNORMAL HIGH (ref 70–99)
Glucose-Capillary: 158 mg/dL — ABNORMAL HIGH (ref 70–99)
Glucose-Capillary: 162 mg/dL — ABNORMAL HIGH (ref 70–99)
Glucose-Capillary: 109 mg/dL — ABNORMAL HIGH (ref 70–99)

## 2022-01-28 LAB — PHOSPHORUS: Phosphorus: 4.6 mg/dL (ref 2.5–4.6)

## 2022-01-28 LAB — MAGNESIUM: Magnesium: 2 mg/dL (ref 1.7–2.4)

## 2022-01-28 MED ORDER — DIPHENHYDRAMINE HCL 25 MG PO CAPS
25.0000 mg | ORAL_CAPSULE | Freq: Once | ORAL | Status: AC
  Administered 2022-01-28: 25 mg via ORAL
  Filled 2022-01-28: qty 1

## 2022-01-28 MED ORDER — INSULIN GLARGINE-YFGN 100 UNIT/ML ~~LOC~~ SOLN
10.0000 [IU] | Freq: Every day | SUBCUTANEOUS | Status: DC
  Administered 2022-01-28 – 2022-01-30 (×3): 10 [IU] via SUBCUTANEOUS
  Filled 2022-01-28 (×4): qty 0.1

## 2022-01-28 MED ORDER — NICOTINE 21 MG/24HR TD PT24
21.0000 mg | MEDICATED_PATCH | Freq: Every day | TRANSDERMAL | Status: DC
  Administered 2022-01-28 – 2022-01-31 (×4): 21 mg via TRANSDERMAL
  Filled 2022-01-28 (×4): qty 1

## 2022-01-28 MED ORDER — LORATADINE 10 MG PO TABS: 10.0000 mg | ORAL_TABLET | Freq: Once | ORAL | Status: DC

## 2022-01-28 NOTE — Progress Notes (Signed)
PT Cancellation Note  Patient Details Name: Gregory [name] MRN: [id number] DOB: 05/09/1961   Cancelled Treatment:    Reason Eval/Treat Not Completed: Attempted PT eval on today-pt declined to participate. He has refused multiple days. When asked if he wants to work with therapy at all during this stay, pt stated that he does not. Will sign off. Recommend return to SNF once medically stable. Per chart review, pt is a long term SNF resident.     [facility]  Office: [phone] Pager: [phone]

## 2022-01-28 NOTE — Progress Notes (Signed)
PROGRESS NOTE  Gregory [name]  DOB: 1961-02-09  PCP: [name], MD JAS:[id number]  DOA: 01/24/2022  LOS: 4 days  Hospital Day: 5  Brief narrative: Gregory [name] is a 61 y.o. male with PMH significant for DM2, HTN, HLD, CHF, COPD, CKD, chronic back pain, diabetes foot ulcer, osteomyelitis s/p right AKA, prior MRSA bacteremia, prior epidural abscess, depression, chronic anemia. Patient was brought into the ED from [facility] rehab facility on 8/9.  Per report, staff at the facility stated that patient went out and when he returned he was short of breath.  Patient states that he tried a new vape with THC to control neuropathic pain on the right lower extremity amputation site controlled.  Soon after that, he started having continuous cough, choking-like sensation.  He was subsequently brought to the ED.  In the ED, patient had a high fever of 104.5, heart rate 115, blood pressure elevated to 204/68. Labs showed creatinine elevated to 30/2.1, WBC count 13.9, hemoglobin low at 9.6. Chest x-ray showed ill-defined opacities of the left midlung and right lower lung suspicious for infiltrates UDS was positive for THC and opiates Patient was admitted to ICU Blood culture sent on admission is growing Streptococcus species in 3 out of 4 samples. 8/11, transferred out to hospitalist service.  Subjective: Patient was seen and examined this morning.  Middle-aged Caucasian male.  Looks older for his age.  Daughter at bedside. Patient and his daughter state that swelling on the right side of his face is improving. Chart reviewed  Assessment and plan: Severe sepsis POA Streptococcus bacteremia Presented with high fever, elevated blood pressure, heart rate, leukocytosis, AKI, encephalopathy  Chest x-ray with ill-defined opacity in the left midlung and right lower lung. Unclear if patient was truly septic at presentation or labs are abnormal because of drug use Streptococcus bacteremia of unclear  significance per ID Currently on IV Rocephin.  Repeat blood culture sent on 8/10 has not shown any growth so far. 2D echo without vegetations.  If repeat blood culture negative, may not need TEE. Recent Labs  Lab 01/24/22 1454 01/24/22 1644 01/24/22 1930 01/24/22 2208 01/25/22 1259 01/26/22 1000 01/27/22 0831 01/28/22 0549  WBC 13.9*  --   --   --  12.8* 6.7 6.2 5.6  LATICACIDVEN 1.6 1.0 0.6 0.6  --   --   --   --    Right Orbital Swelling and Edema Patient has a of melanoma on the right side status post radiation.  He used to follow-up with oncologist Dr. Morene Rankins.  In the most recent follow-up last year, patient was noted to have a mass and offered further radiation which patient refused and wanted to wait and watch.  He does not have any visual impairment on the right eye on that side.  His left eye vision is impaired because of stroke in the past. This admission, patient was noted to have more swelling on the left side of the face extending down to the neck.  Patient and family state that the swelling is improving after steroid and antibiotic drops were initiated. 8/12, CT orbits and maxillofacial were obtained.  It showed slight increase in the size of the soft tissue mass over the right maxilla from 13 mm in 2021 to 17 mm now. Slight increase in skin thickening along the lateral margin of the right eyelid and inferior eyelid with may be related to the patient's known melanoma.  Patient is not sure if she would like to follow-up  with oncology as he still does not plan to do any further work-up or intervention despite the increase in size.  Daughter at bedside is aware of.  AKI on CKD stage IIIb Baseline creatinine less than 1.9.  Presented with creatinine elevated to 2.21 which worsened to peak at 2.52 probably because of Lasix that he required initially.  Creatinine improving now, currently at 1.75.   Recent Labs    07/06/21 0147 07/07/21 0634 09/05/21 0537 09/06/21 0740 09/08/21 0402  01/24/22 1454 01/25/22 1259 01/26/22 1000 01/27/22 0831 01/28/22 0549  BUN 43* 51* 34* 33* 28* 30* 33* 41* 36* 29*  CREATININE 2.17* 2.52* 1.88* 1.88* 1.60* 2.21* 2.18* 2.52* 2.02* 6.29*   Acute metabolic encephalopathy Presumed secondary to sepsis versus drug use  Mental status gradually improved.   Continue oxycodone 20 mg every 8 as before, Lyrica   Type 2 diabetes mellitus -A1c 7.5 on March 2023 -PTA on glipizide 5 mg daily Lantus 12 units twice daily, sliding scale insulin. -Currently only on sliding scale insulin with Accu-Cheks.  Blood good level running over 200 frequently.  I would add Lantus 10 units from tonight. Recent Labs  Lab 01/27/22 1137 01/27/22 1635 01/27/22 2103 01/28/22 0749 01/28/22 1135  GLUCAP 136* 203* 216* 164* 158*    Chronic diastolic CHF Essential hypertension -Echo with EF 55 to 60%, normal LV systolic function, grade 1 diastolic dysfunction, normal RV systolic function  -PTA on Lopressor, Norvasc.  Continue same.  IV hydralazine as needed  HLD Continue Crestor   BPH tamsulosin   Chronic anemia due to chronic illness Hemoglobin slightly low and stable.  Continue monitor Recent Labs    01/24/22 1454 01/25/22 1259 01/26/22 1000 01/27/22 0831 01/28/22 0549  HGB 11.4* 10.5* 10.0* 9.6* 10.1*  MCV 90.0 93.8 93.2 91.3 91.5   Chronic left leg wound and lymphedema WOC nurse evaluated and recommending long-term management of lymphedema with compression and manual massage as   Morbid obesity  Body mass index is 32.41 kg/m. Patient has been advised to make an attempt to improve diet and exercise patterns to aid in weight loss.  Goals of care   Code Status: Full Code    Mobility: Wheelchair bound at baseline  Skin assessment:     Nutritional status:  Body mass index is 32.41 kg/m.          Diet:  Diet Order             Diet Carb Modified Fluid consistency: Thin; Room service appropriate? Yes  Diet effective now                    DVT prophylaxis:  heparin injection 5,000 Units Start: 01/24/22 2200 SCDs Start: 01/24/22 1730   Antimicrobials: IV Rocephin Fluid: None Consultants: ID Family Communication: Daughter at bedside  Status is: Inpatient  Continue in-hospital care because: Pending final blood culture report Level of care: Telemetry   Dispo: The patient is from: long-term nursing home              Anticipated d/c is to: Back to long-term nursing home in 2 to 3 days              Patient currently is not medically stable to d/c.   Difficult to place patient No     Infusions:   sodium chloride Stopped (01/26/22 1400)   cefTRIAXone (ROCEPHIN)  IV 2 g (01/28/22 1223)    Scheduled Meds:  amLODipine  10 mg Oral Daily  docusate sodium  100 mg Oral BID   fluticasone  1 spray Each Nare Daily   guaiFENesin  1,200 mg Oral BID   heparin  5,000 Units Subcutaneous Q8H   insulin aspart  0-15 Units Subcutaneous TID AC & HS   insulin glargine-yfgn  10 Units Subcutaneous QHS   loratadine  10 mg Oral Daily   metoprolol tartrate  12.5 mg Oral BID   mupirocin ointment  1 Application Nasal BID   nicotine  21 mg Transdermal Daily   mouth rinse  15 mL Mouth Rinse 4 times per day   oxyCODONE  30 mg Oral Q8H   pantoprazole  40 mg Oral Daily   polyethylene glycol  17 g Oral Daily   prednisoLONE acetate  1 drop Right Eye Q4H   pregabalin  150 mg Oral BID   rosuvastatin  10 mg Oral QHS   senna-docusate  1 tablet Oral QHS   tamsulosin  0.4 mg Oral QPM   trimethoprim-polymyxin b  1 drop Right Eye Q4H    PRN meds: sodium chloride, acetaminophen, ALPRAZolam, alum & mag hydroxide-simeth, docusate sodium, hydrALAZINE, HYDROmorphone (DILAUDID) injection, ipratropium, labetalol, levalbuterol, mouth rinse, polyethylene glycol, polyvinyl alcohol, tiZANidine, zolpidem   Antimicrobials: Anti-infectives (From admission, onward)    Start     Dose/Rate Route Frequency Ordered Stop   01/26/22 1200   cefTRIAXone (ROCEPHIN) 2 g in sodium chloride 0.9 % 100 mL IVPB        2 g 200 mL/hr over 30 Minutes Intravenous Every 24 hours 01/26/22 1048     01/25/22 1800  vancomycin (VANCOCIN) IVPB 1000 mg/200 mL premix  Status:  Discontinued        1,000 mg 200 mL/hr over 60 Minutes Intravenous Every 24 hours 01/24/22 1743 01/26/22 1048   01/25/22 0600  ceFEPIme (MAXIPIME) 2 g in sodium chloride 0.9 % 100 mL IVPB  Status:  Discontinued        2 g 200 mL/hr over 30 Minutes Intravenous Every 12 hours 01/24/22 1743 01/24/22 1841   01/24/22 1930  meropenem (MERREM) 1 g in sodium chloride 0.9 % 100 mL IVPB  Status:  Discontinued        1 g 200 mL/hr over 30 Minutes Intravenous Every 12 hours 01/24/22 1841 01/25/22 1008   01/24/22 1715  vancomycin (VANCOREADY) IVPB 2000 mg/400 mL        2,000 mg 200 mL/hr over 120 Minutes Intravenous  Once 01/24/22 1707 01/24/22 1900   01/24/22 1700  ceFEPIme (MAXIPIME) 2 g in sodium chloride 0.9 % 100 mL IVPB        2 g 200 mL/hr over 30 Minutes Intravenous  Once 01/24/22 1656 01/24/22 1728   01/24/22 1700  metroNIDAZOLE (FLAGYL) IVPB 500 mg        500 mg 100 mL/hr over 60 Minutes Intravenous  Once 01/24/22 1656 01/24/22 1900   01/24/22 1700  vancomycin (VANCOCIN) IVPB 1000 mg/200 mL premix  Status:  Discontinued        1,000 mg 200 mL/hr over 60 Minutes Intravenous  Once 01/24/22 1656 01/24/22 1707       Objective: Vitals:   01/28/22 0844 01/28/22 1257  BP: (!) 157/131 (!) 142/70  Pulse: 70 (!) 52  Resp:  20  Temp:  97.9 F (36.6 C)  SpO2:  98%    Intake/Output Summary (Last 24 hours) at 01/28/2022 1443 Last data filed at 01/28/2022 1245 Gross per 24 hour  Intake 600 ml  Output 4300 ml  Net -  3700 ml   Filed Weights   01/25/22 0500 01/27/22 0500 01/28/22 0441  Weight: 114 kg 112.5 kg 108.4 kg   Weight change: -4.1 kg Body mass index is 32.41 kg/m.   Physical Exam: General exam: Pleasant middle-aged male, looks older than his stated age Skin:  No rashes, lesions or ulcers. HEENT: Subsiding swelling of the right side of the face.  Right periorbital area with radiation scar Lungs: Clear to auscultation bilaterally CVS: Regular rate and rhythm, no murmur GI/Abd soft, nontender, nondistended, bowel sound present CNS: Alert, awake, oriented x3 Psychiatry: Mood appropriate Extremities: Prior right AKA, left lower extremity swelling with chronic stasis changes  Data Review: I have personally reviewed the laboratory data and studies available.  F/u labs ordered Unresulted Labs (From admission, onward)     Start     Ordered   01/26/22 0500  CBC  Tomorrow morning,   R       Question:  Specimen collection method  Answer:  Lab=Lab collect   01/25/22 1008   01/24/22 1733  Urinalysis, Routine w reflex microscopic  Once,   R        01/24/22 1734            Signed, Terrilee Croak, MD [facility] 01/28/2022

## 2022-01-28 NOTE — Plan of Care (Signed)
  Problem: Education: Goal: Knowledge of General Education information will improve Description Including pain rating scale, medication(s)/side effects and non-pharmacologic comfort measures Outcome: Progressing   

## 2022-01-28 NOTE — Progress Notes (Signed)
ID brief note ( covering Dr Juleen China for weekend).  8/10 blood cx NG in 2 days  CT orbits 01/27/22 IMPRESSION: 1. Slight increase in size of soft tissue mass over the right maxilla, now measuring up to 17 mm AP diameter. This compares with 13 mm in 2021. 2. Slight increase in skin thickening along the lateral margin of the right eyelid and inferior eyelid. This may be related to the patient's known melanoma. Recommend direct visualization. 3. Focal skin thickening inferiorly and medially on the right is again seen. 4. No significant interval change.   CT maxillo facial 01/27/22 IMPRESSION: 1. Increased prominence of soft tissue in the upper and lower eyelids bilaterally better described on the CT orbits same day. 2. No other acute or focal osseous abnormalities in the face.     Per Primary team, if not improving with the steroid drops and antibiotic drops will need ophthalmology to further evaluate. Defer Ophthalmology evaluation to Primary   Rosiland Oz, MD Infectious Disease Physician [facility] [address] [place], [place] [address] Phone: [phone]  Fax: [phone]

## 2022-01-29 DIAGNOSIS — R6520 Severe sepsis without septic shock: Secondary | ICD-10-CM | POA: Diagnosis not present

## 2022-01-29 DIAGNOSIS — L03211 Cellulitis of face: Secondary | ICD-10-CM

## 2022-01-29 DIAGNOSIS — A419 Sepsis, unspecified organism: Secondary | ICD-10-CM | POA: Diagnosis not present

## 2022-01-29 DIAGNOSIS — N179 Acute kidney failure, unspecified: Secondary | ICD-10-CM | POA: Diagnosis not present

## 2022-01-29 DIAGNOSIS — B954 Other streptococcus as the cause of diseases classified elsewhere: Secondary | ICD-10-CM

## 2022-01-29 DIAGNOSIS — R7881 Bacteremia: Secondary | ICD-10-CM | POA: Diagnosis not present

## 2022-01-29 LAB — CULTURE, BLOOD (ROUTINE X 2)
Culture: NO GROWTH
Special Requests: ADEQUATE

## 2022-01-29 LAB — GLUCOSE, CAPILLARY
Glucose-Capillary: 116 mg/dL — ABNORMAL HIGH (ref 70–99)
Glucose-Capillary: 111 mg/dL — ABNORMAL HIGH (ref 70–99)
Glucose-Capillary: 115 mg/dL — ABNORMAL HIGH (ref 70–99)
Glucose-Capillary: 105 mg/dL — ABNORMAL HIGH (ref 70–99)

## 2022-01-29 MED ORDER — LACTULOSE 10 GM/15ML PO SOLN
10.0000 g | Freq: Three times a day (TID) | ORAL | Status: DC | PRN
  Administered 2022-01-29: 10 g via ORAL
  Filled 2022-01-29: qty 15

## 2022-01-29 MED ORDER — ADULT MULTIVITAMIN W/MINERALS CH
1.0000 | ORAL_TABLET | Freq: Every day | ORAL | Status: DC
  Administered 2022-01-29 – 2022-01-31 (×3): 1 via ORAL
  Filled 2022-01-29 (×3): qty 1

## 2022-01-29 MED ORDER — HYDROCERIN EX CREA
TOPICAL_CREAM | Freq: Two times a day (BID) | CUTANEOUS | Status: DC | PRN
  Filled 2022-01-29: qty 113

## 2022-01-29 MED ORDER — ORAL CARE MOUTH RINSE: 15.0000 mL | OROMUCOSAL | Status: DC | PRN

## 2022-01-29 MED ORDER — ALPRAZOLAM 0.25 MG PO TABS
0.2500 mg | ORAL_TABLET | Freq: Once | ORAL | Status: AC
  Administered 2022-01-29: 0.25 mg via ORAL
  Filled 2022-01-29: qty 1

## 2022-01-29 NOTE — Progress Notes (Signed)
[place] [facility] Infect[facility] Disease  Date of Admission:  01/24/2022     Principal Problem:   Sepsis [facility])          Assessment: 61 year old male admitted with sepsis found to have strep bacteremia.  #Group G strep bacteremia #Right preseptal cellulitis #LeFt leg wounds -8/9 Bcx 2/2 Group  strep. 8/10 Bcx 2/2 no growth - CT orbit on 8/12 showed sizable soft tissue mass on right maxilla increased to 17 mm in AP diameter previously 82m in 2021.  Slight increase skin thickening on the lateral margin of the right eyelid and inferior eyelid may be related to patient's melanoma. -Patient remains afebrile on ceftriaxone - TTE showed no a vegetations, okay to forego TEE given the quick clearance of bacteremia. -Suspect bacteremia secondary to preseptal cellulitis vs LLE (chronic wounds). Patient has a history of melanoma status post radiation.  Noted to have wounds on the left side of his face extending into the neck.  Unclear if pt would like to pursue  follow-up with oncology. -Per Primary's conversation with opthalmology, recommend treatment for  preseptal cellulitis. PT denies any vision issues.  Recommendations: -Continue ceftriaxone. Will like to do another couple days of IV therapy. -Duration of antibiotics pending clinical progression of eye - Follow repeat cultures from ensure clearance    Microbiology:   Antibiotics: Ceftriaxone 8/12-p    SUBJECTIVE: Aferbrile overnight. No new complaints. Pt reports eye feels better.   Review of Systems: Review of Systems  All other systems reviewed and are negative.    Scheduled Meds:  amLODipine  10 mg Oral Daily   docusate sodium  100 mg Oral BID   fluticasone  1 spray Each Nare Daily   guaiFENesin  1,200 mg Oral BID   heparin  5,000 Units Subcutaneous Q8H   insulin aspart  0-15 Units Subcutaneous TID AC & HS   insulin glargine-yfgn  10 Units Subcutaneous QHS   loratadine  10 mg Oral Daily   metoprolol tartrate   12.5 mg Oral BID   nicotine  21 mg Transdermal Daily   oxyCODONE  30 mg Oral Q8H   pantoprazole  40 mg Oral Daily   polyethylene glycol  17 g Oral Daily   prednisoLONE acetate  1 drop Right Eye Q4H   pregabalin  150 mg Oral BID   rosuvastatin  10 mg Oral QHS   senna-docusate  1 tablet Oral QHS   tamsulosin  0.4 mg Oral QPM   trimethoprim-polymyxin b  1 drop Right Eye Q4H   Continuous Infusions:  sodium chloride Stopped (01/26/22 1400)   cefTRIAXone (ROCEPHIN)  IV 2 g (01/28/22 1223)   PRN Meds:.sodium chloride, acetaminophen, ALPRAZolam, alum & mag hydroxide-simeth, docusate sodium, hydrALAZINE, HYDROmorphone (DILAUDID) injection, ipratropium, labetalol, levalbuterol, mouth rinse, polyethylene glycol, polyvinyl alcohol, tiZANidine, zolpidem Allergies  Allergen Reactions   Latex Other (See Comments)    "ALLERGIC," per MAR    OBJECTIVE: Vitals:   01/28/22 0844 01/28/22 1257 01/28/22 2011 01/29/22 0455  BP: (!) 157/131 (!) 142/70 (!) 173/79 (!) 163/82  Pulse: 70 (!) 52 60 68  Resp:  '20 20 20  '$ Temp:  97.9 F (36.6 C) 98.1 F (36.7 C) 97.7 F (36.5 C)  TempSrc:      SpO2:  98% 97% 95%  Weight:      Height:       Body mass index is 32.41 kg/m.  Physical Exam Constitutional:      General: He is not  in acute distress.    Appearance: He is normal weight. He is not toxic-appearing.  HENT:     Head: Normocephalic and atraumatic.     Right Ear: External ear normal.     Left Ear: External ear normal.     Nose: No congestion or rhinorrhea.     Mouth/Throat:     Mouth: Mucous membranes are moist.     Pharynx: Oropharynx is clear.  Eyes:     Extraocular Movements: Extraocular movements intact.     Conjunctiva/sclera: Conjunctivae normal.     Pupils: Pupils are equal, round, and reactive to light.  Cardiovascular:     Rate and Rhythm: Normal rate and regular rhythm.     Heart sounds: No murmur heard.    No friction rub. No gallop.  Pulmonary:     Effort: Pulmonary effort  is normal.     Breath sounds: Normal breath sounds.  Abdominal:     General: Abdomen is flat. Bowel sounds are normal.     Palpations: Abdomen is soft.  Musculoskeletal:        General: No swelling. Normal range of motion.     Cervical back: Normal range of motion and neck supple.  Skin:    General: Skin is warm and dry.     Comments: right periorbital erythema Left leg wounds  Neurological:     General: No focal deficit present.     Mental Status: He is oriented to person, place, and time.  Psychiatric:        Mood and Affect: Mood normal.       Lab Results Lab Results  Component Value Date   WBC 5.6 01/28/2022   HGB 10.1 (L) 01/28/2022   HCT 32.5 (L) 01/28/2022   MCV 91.5 01/28/2022   PLT 144 (L) 01/28/2022    Lab Results  Component Value Date   CREATININE 1.75 (H) 01/28/2022   BUN 29 (H) 01/28/2022   NA 142 01/28/2022   K 4.4 01/28/2022   CL 109 01/28/2022   CO2 25 01/28/2022    Lab Results  Component Value Date   ALT 24 01/28/2022   AST 20 01/28/2022   ALKPHOS 57 01/28/2022   BILITOT 0.4 01/28/2022        Laurice Record, [place] [facility] 01/29/2022, 8:42 AM

## 2022-01-29 NOTE — Progress Notes (Signed)
PROGRESS NOTE  Gregory [name]  DOB: 16-Jun-1961  PCP: [name], MD ZHY:[id number]  DOA: 01/24/2022  LOS: 5 days  Hospital Day: 6  Brief narrative: Gregory [name] is a 61 y.o. male with PMH significant for DM2, HTN, HLD, CHF, COPD, CKD, chronic back pain, diabetes foot ulcer, osteomyelitis s/p right AKA, prior MRSA bacteremia, prior epidural abscess, depression, chronic anemia. Patient was brought into the ED from [facility] facility on 8/9.  Per report, staff at the facility stated that patient went out and when he returned he was short of breath.  Patient states that he tried a new vape with THC to control neuropathic pain on the right lower extremity amputation site controlled.  Soon after that, he started having continuous cough, choking-like sensation.  He was subsequently brought to the ED.  In the ED, patient had a high fever of 104.5, heart rate 115, blood pressure elevated to 204/68. Labs showed creatinine elevated to 30/2.1, WBC count 13.9, hemoglobin low at 9.6. Chest x-ray showed ill-defined opacities of the left midlung and right lower lung suspicious for infiltrates UDS was positive for THC and opiates Patient was admitted to ICU Blood culture sent on admission is growing Streptococcus species in 3 out of 4 samples. 8/11, transferred out to hospitalist service.  Subjective: Patient was seen and examined this morning.   Lying down in bed.  Not in distress.  No new symptoms.  States that his pain is not controlled but he was sleeping sound before I woke him up.  Fell back to sleep right away.  Daughter not at bedside today.  Assessment and plan: Severe sepsis POA Streptococcus bacteremia Presented with high fever, elevated blood pressure, heart rate, leukocytosis, AKI, encephalopathy  Chest x-ray with ill-defined opacity in the left midlung and right lower lung. Unclear if patient was truly septic at presentation or labs are abnormal because of drug  use Streptococcus bacteremia of unclear significance per ID Currently on IV Rocephin.  Repeat blood culture sent on 8/10 has not shown any growth so far. 2D echo without vegetations.  If repeat blood culture negative, may not need TEE.  Per ID note this morning, patient will probably need IV antibiotics for probably next few days. Recent Labs  Lab 01/24/22 1454 01/24/22 1644 01/24/22 1930 01/24/22 2208 01/25/22 1259 01/26/22 1000 01/27/22 0831 01/28/22 0549  WBC 13.9*  --   --   --  12.8* 6.7 6.2 5.6  LATICACIDVEN 1.6 1.0 0.6 0.6  --   --   --   --     Right preseptal cellulitis  Patient was noted to have swelling around the eyelids and the right side of the face suggestive of periorbital cellulitis.  Symptoms improving with local antibiotic, system antibiotics and local steroid.  Patient and his daughter believe that the swelling around the eyelids is improving.  Patient is able to see from his right eye.  He seems to be having preseptal cellulitis which probably could be the source of bacteremia.  There is no indication of orbital cellulitis at this time.   This morning, I called and discussed the case with ophthalmologist Dr. Delight Hoh. Patient does not seem to have orbital cellulitis impaired vision or involvement of optic nerve so far. Local swelling seems to be improving. He recommended to continue antibiotic for preseptal cellulitis.   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  He used  to follow-up with oncologist Dr. Morene Rankins.  In the most recent follow-up last year, patient was noted to have a mass and offered further radiation which patient refused and wanted to wait and watch.  He does not have any visual impairment on the right eye on that side.  His left eye vision is impaired because of stroke in the past. 8/12, CT orbits and maxillofacial were obtained.  It showed slight  increase in the size of the soft tissue mass over the right maxilla from 13 mm in 2021 to 17 mm now. Slight increase in skin thickening along the lateral margin of the right eyelid and inferior eyelid with may be related to the patient's known melanoma.  Patient is not sure if she would like to follow-up with oncology as he still does not plan to do any further work-up or intervention despite the increase in size.  Daughter at bedside is aware of.  AKI on CKD stage IIIb Baseline creatinine less than 1.9.  Presented with creatinine elevated to 2.21 which worsened to peak at 2.52 probably because of Lasix that he required initially.  Creatinine improving now, 1.75 on 8/13.Marland Kitchen Recent Labs    07/06/21 0147 07/07/21 0634 09/05/21 0537 09/06/21 0740 09/08/21 0402 01/24/22 1454 01/25/22 1259 01/26/22 1000 01/27/22 0831 01/28/22 0549  BUN 43* 51* 34* 33* 28* 30* 33* 41* 36* 29*  CREATININE 2.17* 2.52* 1.88* 1.88* 1.60* 2.21* 2.18* 2.52* 2.02* 1.75*    Acute metabolic encephalopathy Presumed secondary to sepsis versus drug use  Mental status gradually improved.   Continue oxycodone 20 mg every 8 as before, Lyrica.  As needed IV Dilaudid.  I would not increase the dose or frequency at this time.  Type 2 diabetes mellitus -A1c 7.5 on March 2023 -PTA on glipizide 5 mg daily Lantus 12 units twice daily, sliding scale insulin. -Currently on Lantus 10 units daily and sliding scale insulin with Accu-Cheks.  Blood sugar level seems better in last 24 hours Recent Labs  Lab 01/28/22 0749 01/28/22 1135 01/28/22 1637 01/28/22 2008 01/29/22 0723  GLUCAP 164* 158* 162* 109* 116*     Chronic diastolic CHF Essential hypertension -Echo with EF 55 to 60%, normal LV systolic function, grade 1 diastolic dysfunction, normal RV systolic function  -PTA on Lopressor, Norvasc.  Continue same.  IV hydralazine as needed  HLD Continue Crestor   BPH tamsulosin   Chronic anemia due to chronic  illness Hemoglobin slightly low and stable.  Continue monitor Recent Labs    01/24/22 1454 01/25/22 1259 01/26/22 1000 01/27/22 0831 01/28/22 0549  HGB 11.4* 10.5* 10.0* 9.6* 10.1*  MCV 90.0 93.8 93.2 91.3 91.5    Chronic left leg wound and lymphedema WOC nurse evaluated and recommending long-term management of lymphedema with compression and manual massage as   Morbid obesity  Body mass index is 32.41 kg/m. Patient has been advised to make an attempt to improve diet and exercise patterns to aid in weight loss.  Goals of care   Code Status: Full Code    Mobility: Wheelchair bound at baseline  Skin assessment:     Nutritional status:  Body mass index is 32.41 kg/m.  Nutrition Problem: Increased nutrient needs Etiology: chronic illness (COPD) Signs/Symptoms: estimated needs     Diet:  Diet Order             Diet Carb Modified Fluid consistency: Thin; Room service appropriate? Yes  Diet effective now  DVT prophylaxis:  heparin injection 5,000 Units Start: 01/24/22 2200 SCDs Start: 01/24/22 1730   Antimicrobials: IV Rocephin Fluid: None Consultants: ID Family Communication: Daughter at bedside  Status is: Inpatient  Continue in-hospital care because: Pending final blood culture report Level of care: Telemetry   Dispo: The patient is from: long-term nursing home              Anticipated d/c is to: Back to long-term nursing home in 2 to 3 days              Patient currently is not medically stable to d/c.   Difficult to place patient No     Infusions:   sodium chloride Stopped (01/26/22 1400)   cefTRIAXone (ROCEPHIN)  IV 2 g (01/28/22 1223)    Scheduled Meds:  amLODipine  10 mg Oral Daily   docusate sodium  100 mg Oral BID   fluticasone  1 spray Each Nare Daily   guaiFENesin  1,200 mg Oral BID   heparin  5,000 Units Subcutaneous Q8H   insulin aspart  0-15 Units Subcutaneous TID AC & HS   insulin glargine-yfgn  10 Units  Subcutaneous QHS   loratadine  10 mg Oral Daily   metoprolol tartrate  12.5 mg Oral BID   multivitamin with minerals  1 tablet Oral Daily   nicotine  21 mg Transdermal Daily   oxyCODONE  30 mg Oral Q8H   pantoprazole  40 mg Oral Daily   polyethylene glycol  17 g Oral Daily   prednisoLONE acetate  1 drop Right Eye Q4H   pregabalin  150 mg Oral BID   rosuvastatin  10 mg Oral QHS   senna-docusate  1 tablet Oral QHS   tamsulosin  0.4 mg Oral QPM   trimethoprim-polymyxin b  1 drop Right Eye Q4H    PRN meds: sodium chloride, acetaminophen, ALPRAZolam, alum & mag hydroxide-simeth, docusate sodium, hydrALAZINE, hydrocerin, HYDROmorphone (DILAUDID) injection, ipratropium, labetalol, levalbuterol, mouth rinse, polyethylene glycol, polyvinyl alcohol, tiZANidine, zolpidem   Antimicrobials: Anti-infectives (From admission, onward)    Start     Dose/Rate Route Frequency Ordered Stop   01/26/22 1200  cefTRIAXone (ROCEPHIN) 2 g in sodium chloride 0.9 % 100 mL IVPB        2 g 200 mL/hr over 30 Minutes Intravenous Every 24 hours 01/26/22 1048     01/25/22 1800  vancomycin (VANCOCIN) IVPB 1000 mg/200 mL premix  Status:  Discontinued        1,000 mg 200 mL/hr over 60 Minutes Intravenous Every 24 hours 01/24/22 1743 01/26/22 1048   01/25/22 0600  ceFEPIme (MAXIPIME) 2 g in sodium chloride 0.9 % 100 mL IVPB  Status:  Discontinued        2 g 200 mL/hr over 30 Minutes Intravenous Every 12 hours 01/24/22 1743 01/24/22 1841   01/24/22 1930  meropenem (MERREM) 1 g in sodium chloride 0.9 % 100 mL IVPB  Status:  Discontinued        1 g 200 mL/hr over 30 Minutes Intravenous Every 12 hours 01/24/22 1841 01/25/22 1008   01/24/22 1715  vancomycin (VANCOREADY) IVPB 2000 mg/400 mL        2,000 mg 200 mL/hr over 120 Minutes Intravenous  Once 01/24/22 1707 01/24/22 1900   01/24/22 1700  ceFEPIme (MAXIPIME) 2 g in sodium chloride 0.9 % 100 mL IVPB        2 g 200 mL/hr over 30 Minutes Intravenous  Once 01/24/22  1656 01/24/22 1728  01/24/22 1700  metroNIDAZOLE (FLAGYL) IVPB 500 mg        500 mg 100 mL/hr over 60 Minutes Intravenous  Once 01/24/22 1656 01/24/22 1900   01/24/22 1700  vancomycin (VANCOCIN) IVPB 1000 mg/200 mL premix  Status:  Discontinued        1,000 mg 200 mL/hr over 60 Minutes Intravenous  Once 01/24/22 1656 01/24/22 1707       Objective: Vitals:   01/28/22 2011 01/29/22 0455  BP: (!) 173/79 (!) 163/82  Pulse: 60 68  Resp: 20 20  Temp: 98.1 F (36.7 C) 97.7 F (36.5 C)  SpO2: 97% 95%    Intake/Output Summary (Last 24 hours) at 01/29/2022 1121 Last data filed at 01/29/2022 0800 Gross per 24 hour  Intake 413.86 ml  Output 2750 ml  Net -2336.14 ml    Filed Weights   01/25/22 0500 01/27/22 0500 01/28/22 0441  Weight: 114 kg 112.5 kg 108.4 kg   Weight change:  Body mass index is 32.41 kg/m.   Physical Exam: General exam: Pleasant middle-aged male, looks older than his stated age Skin: No rashes, lesions or ulcers. HEENT: Subsiding swelling of the right side of the face.  Right periorbital area with radiation scar Lungs: Clear to auscultation bilaterally CVS: Regular rate and rhythm, no murmur GI/Abd soft, nontender, nondistended, bowel sound present CNS: Alert, awake, oriented x3 Psychiatry: Mood appropriate Extremities: Prior right AKA, left lower extremity swelling with chronic stasis changes  Data Review: I have personally reviewed the laboratory data and studies available.  F/u labs ordered Unresulted Labs (From admission, onward)     Start     Ordered   01/26/22 0500  CBC  Tomorrow morning,   R       Question:  Specimen collection method  Answer:  Lab=Lab collect   01/25/22 1008   01/24/22 1733  Urinalysis, Routine w reflex microscopic  Once,   R        01/24/22 1734            Signed, Terrilee Croak, MD [facility] 01/29/2022

## 2022-01-29 NOTE — Progress Notes (Signed)
Initial Nutrition Assessment  DOCUMENTATION CODES:   Obesity unspecified  INTERVENTION:   -MVI with minerals daily -Double protein portions with meals  NUTRITION DIAGNOSIS:   Increased nutrient needs related to chronic illness (COPD) as evidenced by estimated needs.  GOAL:   Patient will meet greater than or equal to 90% of their needs  MONITOR:   PO intake, Supplement acceptance  REASON FOR ASSESSMENT:   Malnutrition Screening Tool    ASSESSMENT:   Pt with PMH significant for DM2, HTN, HLD, CHF, COPD, CKD, chronic back pain, diabetes foot ulcer, osteomyelitis s/p right AKA, prior MRSA bacteremia, prior epidural abscess, depression, chronic anemia.  Pt admitted with severe sepsis and streptococcus bacteremia.   Reviewed I/O's: -1.9 L x 24 hours and -11.5 L since admission  UOP: 2.6 L x 24 hours   Pt unavailable at time of visit. Attempted to speak with pt via call to hospital room phone, however, unable to reach. RD unable to obtain further nutrition-related history or complete nutrition-focused physical exam at this time.    Per ID notes, there is a slight increase of soft tissue mass over the rt maxilla.   Pt with good appetite. He is currently on a carb modified diet. Noted meal completions 100%  Reviewed wt hx; wt has been stable over the past year.   Per [facility] notes, pt with chronic lymphedema.   Medications reviewed and include colace, miralax, and senna-docusate.   Lab Results  Component Value Date   HGBA1C 7.5 (H) 09/06/2021   PTA DM medications are 9.67 mg trulicity weekly, 5-91 units insulin aspart TID with meals and at bedtime, and 12 units insulin glargine daily.   Labs reviewed: CBGS: 109-164 (inpatient orders for glycemic control are 0-15 units insulin aspart TID before meals and at bedtime and 10 units insulin glargine daily at bedtime).    Diet Order:   Diet Order             Diet Carb Modified Fluid consistency: Thin; Room service  appropriate? Yes  Diet effective now                   EDUCATION NEEDS:   No education needs have been identified at this time  Skin:  Skin Assessment: Reviewed RN Assessment  Last BM:  01/25/22  Height:   Ht Readings from Last 1 Encounters:  01/24/22 6' (1.829 m)    Weight:   Wt Readings from Last 1 Encounters:  01/28/22 108.4 kg    BMI:  Body mass index is 32.41 kg/m.  Estimated Nutritional Needs:   Kcal:  1800-2000  Protein:  90-105 grams  Fluid:  > 1.8 L    Loistine Chance, RD, LDN, [place] Registered Dietitian II Certified Diabetes Care and Education Specialist Please refer to [facility] for RD and/or RD on-call/weekend/after hours pager

## 2022-01-29 NOTE — Care Plan (Signed)
Ophthalmology contact by primary team concerning possible pre-septal cellulitis.  Recent CT orbits personally reviewed; I agree that no evidence of post-septal extension, though it should be noted with contrast is preferred to evaluation of possible cellulitis.  As no evidence of optic nerve compromise and vision is unaffected, reasonable to continue current treatment course as long as he continues to improve clinically.  If any worsening, consider CT orbits w/ contrast.  Ophthalmology is available if questions arise, or if clinical status worsens.  Delight Hoh, MD Ophthalmology 5:36 PM

## 2022-01-30 DIAGNOSIS — N179 Acute kidney failure, unspecified: Secondary | ICD-10-CM | POA: Diagnosis not present

## 2022-01-30 DIAGNOSIS — A419 Sepsis, unspecified organism: Secondary | ICD-10-CM | POA: Diagnosis not present

## 2022-01-30 DIAGNOSIS — R6520 Severe sepsis without septic shock: Secondary | ICD-10-CM | POA: Diagnosis not present

## 2022-01-30 DIAGNOSIS — R7881 Bacteremia: Secondary | ICD-10-CM | POA: Diagnosis not present

## 2022-01-30 LAB — CULTURE, BLOOD (ROUTINE X 2)
Culture: NO GROWTH
Culture: NO GROWTH
Special Requests: ADEQUATE
Special Requests: ADEQUATE

## 2022-01-30 LAB — GLUCOSE, CAPILLARY
Glucose-Capillary: 127 mg/dL — ABNORMAL HIGH (ref 70–99)
Glucose-Capillary: 150 mg/dL — ABNORMAL HIGH (ref 70–99)
Glucose-Capillary: 183 mg/dL — ABNORMAL HIGH (ref 70–99)
Glucose-Capillary: 147 mg/dL — ABNORMAL HIGH (ref 70–99)

## 2022-01-30 MED ORDER — PENICILLIN G POTASSIUM 20000000 UNITS IJ SOLR
12.0000 10*6.[IU] | Freq: Two times a day (BID) | INTRAMUSCULAR | Status: DC
  Administered 2022-01-30: 12 10*6.[IU] via INTRAVENOUS
  Filled 2022-01-30 (×4): qty 12

## 2022-01-30 NOTE — Progress Notes (Signed)
[place] for Infectious Disease  Date of Admission:  01/24/2022     Principal Problem:   Sepsis [facility])          Assessment: 61 year old male admitted with sepsis found to have strep bacteremia.  #Group G strep bacteremia #Right preseptal cellulitis #LeFt leg wounds -8/9 Bcx 2/2 Group  strep. 8/10 Bcx 2/2 no growth - CT orbit on 8/12 showed sizable soft tissue mass on right maxilla increased to 17 mm in AP diameter previously 20m in 2021.  Slight increase skin thickening on the lateral margin of the right eyelid and inferior eyelid may be related to patient's melanoma. -Patient remains afebrile on ceftriaxone - TTE showed no a vegetations, okay to forego TEE given the quick clearance of bacteremia. -Suspect bacteremia secondary to preseptal cellulitis vs LLE (chronic wounds). Patient has a history of melanoma status post radiation.  Noted to have wounds on the left side of his face extending into the neck.  Unclear if pt would like to pursue  follow-up with oncology. -Opthomology Dr. GTobe Sosreviewed case and concern for possible pre=septal cellulitis. If any worsening then consider CT orbits w/con  Recommendations: -Continue ceftriaxone, will re-evaluate witching to PO regimen tomorrow.  -Duration of antibiotics pending clinical progression of eye - Follow repeat cultures from ensure clearance    Microbiology:   Antibiotics: Ceftriaxone 8/12-p    SUBJECTIVE: Aferbrile overnight. No new complaints. Pt reports eye feels better.   Review of Systems: Review of Systems  All other systems reviewed and are negative.    Scheduled Meds:  amLODipine  10 mg Oral Daily   fluticasone  1 spray Each Nare Daily   guaiFENesin  1,200 mg Oral BID   heparin  5,000 Units Subcutaneous Q8H   insulin aspart  0-15 Units Subcutaneous TID AC & HS   insulin glargine-yfgn  10 Units Subcutaneous QHS   loratadine  10 mg Oral Daily   metoprolol tartrate  12.5 mg Oral BID    multivitamin with minerals  1 tablet Oral Daily   nicotine  21 mg Transdermal Daily   oxyCODONE  30 mg Oral Q8H   pantoprazole  40 mg Oral Daily   polyethylene glycol  17 g Oral Daily   prednisoLONE acetate  1 drop Right Eye Q4H   pregabalin  150 mg Oral BID   rosuvastatin  10 mg Oral QHS   senna-docusate  1 tablet Oral QHS   tamsulosin  0.4 mg Oral QPM   trimethoprim-polymyxin b  1 drop Right Eye Q4H   Continuous Infusions:  sodium chloride 10 mL/hr at 01/29/22 1207   penicillin G potassium 12 Million Units in dextrose 5 % 500 mL continuous infusion 12 Million Units (01/30/22 1134)   PRN Meds:.sodium chloride, acetaminophen, ALPRAZolam, alum & mag hydroxide-simeth, docusate sodium, hydrALAZINE, hydrocerin, HYDROmorphone (DILAUDID) injection, ipratropium, labetalol, lactulose, levalbuterol, mouth rinse, polyethylene glycol, polyvinyl alcohol, tiZANidine, zolpidem Allergies  Allergen Reactions   Latex Other (See Comments)    "ALLERGIC," per MAR    OBJECTIVE: Vitals:   01/29/22 1306 01/29/22 2050 01/30/22 0310 01/30/22 1021  BP: 134/65 (!) 162/79 (!) 160/77 (!) 146/77  Pulse: (!) 57 65 68 62  Resp: 18 20    Temp: 98 F (36.7 C) 98.1 F (36.7 C) 98.2 F (36.8 C)   TempSrc: Oral Oral Oral   SpO2: 97% 97%    Weight:      Height:       Body mass index  is 32.41 kg/m.  Physical Exam Constitutional:      General: He is not in acute distress.    Appearance: He is normal weight. He is not toxic-appearing.  HENT:     Head: Normocephalic and atraumatic.     Right Ear: External ear normal.     Left Ear: External ear normal.     Nose: No congestion or rhinorrhea.     Mouth/Throat:     Mouth: Mucous membranes are moist.     Pharynx: Oropharynx is clear.  Eyes:     Extraocular Movements: Extraocular movements intact.     Conjunctiva/sclera: Conjunctivae normal.     Pupils: Pupils are equal, round, and reactive to light.  Cardiovascular:     Rate and Rhythm: Normal rate and  regular rhythm.     Heart sounds: No murmur heard.    No friction rub. No gallop.  Pulmonary:     Effort: Pulmonary effort is normal.     Breath sounds: Normal breath sounds.  Abdominal:     General: Abdomen is flat. Bowel sounds are normal.     Palpations: Abdomen is soft.  Musculoskeletal:        General: No swelling. Normal range of motion.     Cervical back: Normal range of motion and neck supple.  Skin:    General: Skin is warm and dry.     Comments: right periorbital erythema Left leg wounds  Neurological:     General: No focal deficit present.     Mental Status: He is oriented to person, place, and time.  Psychiatric:        Mood and Affect: Mood normal.       Lab Results Lab Results  Component Value Date   WBC 5.6 01/28/2022   HGB 10.1 (L) 01/28/2022   HCT 32.5 (L) 01/28/2022   MCV 91.5 01/28/2022   PLT 144 (L) 01/28/2022    Lab Results  Component Value Date   CREATININE 1.75 (H) 01/28/2022   BUN 29 (H) 01/28/2022   NA 142 01/28/2022   K 4.4 01/28/2022   CL 109 01/28/2022   CO2 25 01/28/2022    Lab Results  Component Value Date   ALT 24 01/28/2022   AST 20 01/28/2022   ALKPHOS 57 01/28/2022   BILITOT 0.4 01/28/2022        Laurice Record, [place] [facility] 01/30/2022, 4:34 PM

## 2022-01-30 NOTE — Progress Notes (Signed)
PROGRESS NOTE  Gregory [name]  DOB: [birth year]  PCP: [name], MD GYJ:[id number]  DOA: 01/24/2022  LOS: 6 days  Hospital Day: 7  Brief narrative: Gregory [name] is a 61 y.o. male with PMH significant for DM2, HTN, HLD, CHF, COPD, CKD, chronic back pain, diabetes foot ulcer, osteomyelitis s/p right AKA, prior MRSA bacteremia, prior epidural abscess, depression, chronic anemia. Patient was brought into the ED from [facility] on 8/9.  Per report, staff at the facility stated that patient went out and when he returned he was short of breath.  Patient states that he tried a new vape with THC to control neuropathic pain on the right lower extremity amputation site controlled.  Soon after that, he started having continuous cough, choking-like sensation.  He was subsequently brought to the ED.  In the ED, patient had a high fever of 104.5, heart rate 115, blood pressure elevated to 204/68. Labs showed creatinine elevated to 30/2.1, WBC count 13.9, hemoglobin low at 9.6. Chest x-ray showed ill-defined opacities of the left midlung and right lower lung suspicious for infiltrates UDS was positive for THC and opiates Patient was admitted to ICU Blood culture sent on admission is growing Streptococcus species in 3 out of 4 samples. 8/11, transferred out to hospitalist service.  Subjective: Patient was seen and examined this morning.   Propped up in bed.  Not in distress.  No new symptoms.  Pain controlled.  Right periorbital swelling improving  Assessment and plan: Severe sepsis POA Streptococcus bacteremia Presented with high fever, elevated blood pressure, heart rate, leukocytosis, AKI, encephalopathy  Chest x-ray with ill-defined opacity in the left midlung and right lower lung. Streptococcus bacteremia of unclear significance per ID Currently on IV Rocephin.  Repeat blood culture sent on 8/10 has not shown any growth so far. 2D echo without vegetations.  If repeat blood  culture negative, may not need TEE.   Discussed with ID this morning.  Continue IV antibiotics for today.  Hopefully discharge on oral antibiotics tomorrow. Recent Labs  Lab 01/24/22 1454 01/24/22 1644 01/24/22 1930 01/24/22 2208 01/25/22 1259 01/26/22 1000 01/27/22 0831 01/28/22 0549  WBC 13.9*  --   --   --  12.8* 6.7 6.2 5.6  LATICACIDVEN 1.6 1.0 0.6 0.6  --   --   --   --     Right preseptal cellulitis  Patient was noted to have swelling around the eyelids and the right side of the face suggestive of periorbital cellulitis.  Symptoms improving with local antibiotic, system antibiotics and local steroid.  Patient and his daughter believe that the swelling around the eyelids is improving.  Patient is able to see from his right eye.  He seems to be having preseptal cellulitis which probably could be the source of bacteremia.  There is no indication of orbital cellulitis at this time.   8/14, I called and discussed the case with ophthalmologist Dr. Delight Hoh. Patient does not seem to have orbital cellulitis impaired vision or involvement of optic nerve so far. Local swelling is steadily improving. He recommended to continue antibiotic for preseptal cellulitis.   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  He used to follow-up with oncologist Dr. Morene Rankins.  In the most recent follow-up last year, patient was noted to have a mass and offered further radiation which patient refused and wanted to wait and watch.  He does not  have any visual impairment on the right eye on that side.  His left eye vision is impaired because of stroke in the past. 8/12, CT orbits and maxillofacial were obtained.  It showed slight increase in the size of the soft tissue mass over the right maxilla from 13 mm in 2021 to 17 mm now. Slight increase in skin thickening along the lateral margin of the right eyelid and  inferior eyelid with may be related to the patient's known melanoma.  Patient is not sure if she would like to follow-up with oncology as he still does not plan to do any further work-up or intervention despite the increase in size.  Daughter at bedside is aware of.  AKI on CKD stage IIIb Baseline creatinine less than 1.9.  Presented with creatinine elevated to 2.21 which worsened to peak at 2.52 probably because of Lasix that he required initially.  Creatinine improving now, 1.75 on 8/13. Recent Labs    07/06/21 0147 07/07/21 0634 09/05/21 0537 09/06/21 0740 09/08/21 0402 01/24/22 1454 01/25/22 1259 01/26/22 1000 01/27/22 0831 01/28/22 0549  BUN 43* 51* 34* 33* 28* 30* 33* 41* 36* 29*  CREATININE 2.17* 2.52* 1.88* 1.88* 1.60* 2.21* 2.18* 2.52* 2.02* 1.75*    Acute metabolic encephalopathy Presumed secondary to sepsis versus drug use  Mental status gradually improved.   Continue oxycodone 20 mg every 8 as before, Lyrica.  As needed IV Dilaudid.  I would not increase the dose or frequency at this time.  Type 2 diabetes mellitus -A1c 7.5 on March 2023 -PTA on glipizide 5 mg daily Lantus 12 units twice daily, sliding scale insulin. -Currently on Lantus 10 units daily and sliding scale insulin with Accu-Cheks.  Blood sugar level is consistently less than 150. Recent Labs  Lab 01/29/22 1132 01/29/22 1715 01/29/22 2052 01/30/22 0643 01/30/22 0804  GLUCAP 111* 115* 105* 127* 150*     Chronic diastolic CHF Essential hypertension -Echo with EF 55 to 60%, normal LV systolic function, grade 1 diastolic dysfunction, normal RV systolic function  -PTA on Lopressor, Norvasc.  Continue same.  IV hydralazine as needed  HLD Continue Crestor   BPH tamsulosin   Chronic anemia due to chronic illness Hemoglobin slightly low and stable.  Continue monitor Recent Labs    01/24/22 1454 01/25/22 1259 01/26/22 1000 01/27/22 0831 01/28/22 0549  HGB 11.4* 10.5* 10.0* 9.6* 10.1*  MCV  90.0 93.8 93.2 91.3 91.5    Chronic left leg wound and lymphedema WOC nurse evaluated and recommending long-term management of lymphedema with compression and manual massage as   Morbid obesity  Body mass index is 32.41 kg/m. Patient has been advised to make an attempt to improve diet and exercise patterns to aid in weight loss.  Goals of care   Code Status: Full Code    Mobility: Wheelchair bound at baseline  Skin assessment:     Nutritional status:  Body mass index is 32.41 kg/m.  Nutrition Problem: Increased nutrient needs Etiology: chronic illness (COPD) Signs/Symptoms: estimated needs     Diet:  Diet Order             Diet Carb Modified Fluid consistency: Thin; Room service appropriate? Yes with Assist  Diet effective now                   DVT prophylaxis:  heparin injection 5,000 Units Start: 01/24/22 2200 SCDs Start: 01/24/22 1730   Antimicrobials: IV Rocephin Fluid: None Consultants: ID Family Communication: Daughter not at bedside today  Status is: Inpatient  Continue in-hospital care because: On IV antibiotics. Level of care: Telemetry   Dispo: The patient is from: long-term nursing home              Anticipated d/c is to: Back to long-term nursing home tomorrow hopefully              Patient currently is not medically stable to d/c.   Difficult to place patient No     Infusions:   sodium chloride 10 mL/hr at 01/29/22 1207   penicillin G potassium 12 Million Units in dextrose 5 % 500 mL continuous infusion 12 Million Units (01/30/22 1134)    Scheduled Meds:  amLODipine  10 mg Oral Daily   fluticasone  1 spray Each Nare Daily   guaiFENesin  1,200 mg Oral BID   heparin  5,000 Units Subcutaneous Q8H   insulin aspart  0-15 Units Subcutaneous TID AC & HS   insulin glargine-yfgn  10 Units Subcutaneous QHS   loratadine  10 mg Oral Daily   metoprolol tartrate  12.5 mg Oral BID   multivitamin with minerals  1 tablet Oral Daily   nicotine   21 mg Transdermal Daily   oxyCODONE  30 mg Oral Q8H   pantoprazole  40 mg Oral Daily   polyethylene glycol  17 g Oral Daily   prednisoLONE acetate  1 drop Right Eye Q4H   pregabalin  150 mg Oral BID   rosuvastatin  10 mg Oral QHS   senna-docusate  1 tablet Oral QHS   tamsulosin  0.4 mg Oral QPM   trimethoprim-polymyxin b  1 drop Right Eye Q4H    PRN meds: sodium chloride, acetaminophen, ALPRAZolam, alum & mag hydroxide-simeth, docusate sodium, hydrALAZINE, hydrocerin, HYDROmorphone (DILAUDID) injection, ipratropium, labetalol, lactulose, levalbuterol, mouth rinse, polyethylene glycol, polyvinyl alcohol, tiZANidine, zolpidem   Antimicrobials: Anti-infectives (From admission, onward)    Start     Dose/Rate Route Frequency Ordered Stop   01/30/22 0945  penicillin G potassium 12 Million Units in dextrose 5 % 500 mL continuous infusion        12 Million Units 41.7 mL/hr over 12 Hours Intravenous Every 12 hours 01/30/22 0859     01/26/22 1200  cefTRIAXone (ROCEPHIN) 2 g in sodium chloride 0.9 % 100 mL IVPB  Status:  Discontinued        2 g 200 mL/hr over 30 Minutes Intravenous Every 24 hours 01/26/22 1048 01/30/22 0859   01/25/22 1800  vancomycin (VANCOCIN) IVPB 1000 mg/200 mL premix  Status:  Discontinued        1,000 mg 200 mL/hr over 60 Minutes Intravenous Every 24 hours 01/24/22 1743 01/26/22 1048   01/25/22 0600  ceFEPIme (MAXIPIME) 2 g in sodium chloride 0.9 % 100 mL IVPB  Status:  Discontinued        2 g 200 mL/hr over 30 Minutes Intravenous Every 12 hours 01/24/22 1743 01/24/22 1841   01/24/22 1930  meropenem (MERREM) 1 g in sodium chloride 0.9 % 100 mL IVPB  Status:  Discontinued        1 g 200 mL/hr over 30 Minutes Intravenous Every 12 hours 01/24/22 1841 01/25/22 1008   01/24/22 1715  vancomycin (VANCOREADY) IVPB 2000 mg/400 mL        2,000 mg 200 mL/hr over 120 Minutes Intravenous  Once 01/24/22 1707 01/24/22 1900   01/24/22 1700  ceFEPIme (MAXIPIME) 2 g in sodium chloride  0.9 % 100 mL IVPB  2 g 200 mL/hr over 30 Minutes Intravenous  Once 01/24/22 1656 01/24/22 1728   01/24/22 1700  metroNIDAZOLE (FLAGYL) IVPB 500 mg        500 mg 100 mL/hr over 60 Minutes Intravenous  Once 01/24/22 1656 01/24/22 1900   01/24/22 1700  vancomycin (VANCOCIN) IVPB 1000 mg/200 mL premix  Status:  Discontinued        1,000 mg 200 mL/hr over 60 Minutes Intravenous  Once 01/24/22 1656 01/24/22 1707       Objective: Vitals:   01/30/22 0310 01/30/22 1021  BP: (!) 160/77 (!) 146/77  Pulse: 68 62  Resp:    Temp: 98.2 F (36.8 C)   SpO2:     No intake or output data in the 24 hours ending 01/30/22 1153  Filed Weights   01/25/22 0500 01/27/22 0500 01/28/22 0441  Weight: 114 kg 112.5 kg 108.4 kg   Weight change:  Body mass index is 32.41 kg/m.   Physical Exam: General exam: Pleasant middle-aged male, looks older than his stated age Skin: No rashes, lesions or ulcers. HEENT: Subsiding swelling of the right side of the face. Right periorbital swelling improving  Lungs: Clear to auscultation bilaterally CVS: Regular rate and rhythm, no murmur GI/Abd soft, nontender, nondistended, bowel sound present CNS: Alert, awake, oriented x3 Psychiatry: Mood appropriate Extremities: Prior right AKA, left lower extremity swelling with chronic stasis changes  Data Review: I have personally reviewed the laboratory data and studies available.  F/u labs ordered Unresulted Labs (From admission, onward)     Start     Ordered   01/26/22 0500  CBC  Tomorrow morning,   R       Question:  Specimen collection method  Answer:  Lab=Lab collect   01/25/22 1008   01/24/22 1733  Urinalysis, Routine w reflex microscopic  Once,   R        01/24/22 1734            Signed, Terrilee Croak, MD [facility] 01/30/2022

## 2022-01-31 DIAGNOSIS — R6520 Severe sepsis without septic shock: Secondary | ICD-10-CM | POA: Diagnosis not present

## 2022-01-31 DIAGNOSIS — A419 Sepsis, unspecified organism: Secondary | ICD-10-CM | POA: Diagnosis not present

## 2022-01-31 DIAGNOSIS — N179 Acute kidney failure, unspecified: Secondary | ICD-10-CM | POA: Diagnosis not present

## 2022-01-31 LAB — GLUCOSE, CAPILLARY
Glucose-Capillary: 141 mg/dL — ABNORMAL HIGH (ref 70–99)
Glucose-Capillary: 161 mg/dL — ABNORMAL HIGH (ref 70–99)

## 2022-01-31 MED ORDER — GUAIFENESIN ER 600 MG PO TB12
600.0000 mg | ORAL_TABLET | Freq: Two times a day (BID) | ORAL | 0 refills | Status: AC
Start: 2022-01-31 — End: 2022-02-08

## 2022-01-31 MED ORDER — POLYMYXIN B-TRIMETHOPRIM 10000-0.1 UNIT/ML-% OP SOLN: 1.0000 [drp] | OPHTHALMIC | 0 refills | Status: AC

## 2022-01-31 MED ORDER — LANTUS SOLOSTAR 100 UNIT/ML ~~LOC~~ SOPN: 12.0000 [IU] | PEN_INJECTOR | Freq: Every day | SUBCUTANEOUS | 11 refills | Status: DC

## 2022-01-31 MED ORDER — LEVALBUTEROL HCL 0.63 MG/3ML IN NEBU: 0.6300 mg | INHALATION_SOLUTION | Freq: Four times a day (QID) | RESPIRATORY_TRACT | 12 refills | Status: DC | PRN

## 2022-01-31 MED ORDER — LACTULOSE 10 GM/15ML PO SOLN: 10.0000 g | Freq: Three times a day (TID) | ORAL | 0 refills | Status: DC | PRN

## 2022-01-31 MED ORDER — CEFADROXIL 500 MG PO CAPS: 500.0000 mg | ORAL_CAPSULE | Freq: Two times a day (BID) | ORAL | Status: DC

## 2022-01-31 MED ORDER — CEFADROXIL 500 MG PO CAPS
1000.0000 mg | ORAL_CAPSULE | Freq: Two times a day (BID) | ORAL | 0 refills | Status: AC
Start: 2022-01-31 — End: 2022-02-04

## 2022-01-31 MED ORDER — PREDNISOLONE ACETATE 1 % OP SUSP
1.0000 [drp] | OPHTHALMIC | 0 refills | Status: AC
Start: 2022-01-31 — End: 2022-02-05

## 2022-01-31 MED ORDER — CEFADROXIL 500 MG PO CAPS
1000.0000 mg | ORAL_CAPSULE | Freq: Two times a day (BID) | ORAL | Status: DC
Start: 2022-01-31 — End: 2022-01-31
  Administered 2022-01-31: 1000 mg via ORAL
  Filled 2022-01-31: qty 2

## 2022-01-31 MED ORDER — INSULIN LISPRO (1 UNIT DIAL) 100 UNIT/ML (KWIKPEN)
0.0000 [IU] | PEN_INJECTOR | SUBCUTANEOUS | 11 refills | Status: AC
Start: 2022-01-31 — End: ?

## 2022-01-31 NOTE — Discharge Summary (Signed)
Physician Discharge Summary  [name] TMH:[id number] DOB: 02/10/1961 DOA: 01/24/2022  PCP: [name], MD  Admit date: 01/24/2022 Discharge date: 01/31/2022  Admitted From: Long-term nursing facility Discharge disposition: Back to long-term nursing facility  Recommendations at discharge:  Complete the antibiotic course in next 4 days    Brief narrative: Gregory [name] is a 61 y.o. male with PMH significant for DM2, HTN, HLD, CHF, COPD, CKD, chronic back pain, diabetes foot ulcer, osteomyelitis s/p right AKA, prior MRSA bacteremia, prior epidural abscess, depression, chronic anemia. Patient was brought into the ED from [place] re[facility] facility on 8/9.  Per report, staff at the facility stated that patient went out and when he returned he was short of breath.  Patient states that he tried a new vape with THC to control neuropathic pain on the right lower extremity amputation site controlled.  Soon after that, he started having continuous cough, choking-like sensation.  He was subsequently brought to the ED.  In the ED, patient had a high fever of 104.5, heart rate 115, blood pressure elevated to 204/68. Labs showed creatinine elevated to 30/2.1, WBC count 13.9, hemoglobin low at 9.6. Chest x-ray showed ill-defined opacities of the left midlung and right lower lung suspicious for infiltrates UDS was positive for THC and opiates Patient was admitted to ICU Blood culture sent on admission is growing Streptococcus species in 3 out of 4 samples. 8/11, transferred out to hospitalist service.  Subjective: Patient was seen and examined this morning.   Propped up in bed.  Not in distress.  No new symptoms.  Pain controlled.  Right periorbital swelling improved.  Hospital course: Severe sepsis POA Streptococcus bacteremia Presented with high fever, elevated blood pressure, heart rate, leukocytosis, AKI, encephalopathy  Chest x-ray with ill-defined opacity in the left midlung and right  lower lung. Streptococcus bacteremia of unclear significance per ID Currently clinically improving on IV Rocephin.  Repeat blood culture sent on 8/10 has not shown any growth so far. 2D echo without vegetations.  ID consult appreciated.  Right.  Vital swelling much improved.  Recommended to switch IV Rocephin to oral cefadroxil 1 g p.o. twice daily for 10 days (to complete 10-day course )(EOT 8/25) to cover group G strep bacteremia and preseptal cellulitis. Patient to follow-up in ID clinic on 8/29. Recent Labs  Lab 01/24/22 1454 01/24/22 1644 01/24/22 1930 01/24/22 2208 01/25/22 1259 01/26/22 1000 01/27/22 0831 01/28/22 0549  WBC 13.9*  --   --   --  12.8* 6.7 6.2 5.6  LATICACIDVEN 1.6 1.0 0.6 0.6  --   --   --   --    Right preseptal cellulitis  Patient was noted to have swelling around the eyelids and the right side of the face suggestive of periorbital cellulitis.  Symptoms improving with local antibiotic, system antibiotics and local steroid.  Patient and his daughter believe that the swelling around the eyelids is improving.  Patient is able to see from his right eye.  He seems to be having preseptal cellulitis which probably could be the source of bacteremia.  There is no indication of orbital cellulitis at this time.   8/14, I called and discussed the case with ophthalmologist Dr. Delight Hoh. Patient does not seem to have orbital cellulitis impaired vision or involvement of optic nerve so far. Local swelling is steadily improving. He recommended to continue antibiotic for preseptal cellulitis.  Patient has had significant improvement of preseptal cellulitis with antibiotics..  Local topical antibiotics and  steroids to continue for next 5 days.  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  He used to follow-up with oncologist Dr. Morene Rankins.  In the most recent follow-up last year,  patient was noted to have a mass and offered further radiation which patient refused and wanted to wait and watch.  He does not have any visual impairment on the right eye on that side.  His left eye vision is impaired because of stroke in the past. 8/12, CT orbits and maxillofacial were obtained.  It showed slight increase in the size of the soft tissue mass over the right maxilla from 13 mm in 2021 to 17 mm now. Slight increase in skin thickening along the lateral margin of the right eyelid and inferior eyelid with may be related to the patient's known melanoma.  Patient is not sure if she would like to follow-up with oncology as he still does not plan to do any further work-up or intervention despite the increase in size.  Daughter at bedside is aware of.  AKI on CKD stage IIIb Baseline creatinine less than 1.9.  Presented with creatinine elevated to 2.21 which worsened to peak at 2.52 probably because of Lasix that he required initially.  Creatinine improving now, 1.75 on 8/13. Recent Labs    07/06/21 0147 07/07/21 0634 09/05/21 0537 09/06/21 0740 09/08/21 0402 01/24/22 1454 01/25/22 1259 01/26/22 1000 01/27/22 0831 01/28/22 0549  BUN 43* 51* 34* 33* 28* 30* 33* 41* 36* 29*  CREATININE 2.17* 2.52* 1.88* 1.88* 1.60* 2.21* 2.18* 2.52* 2.02* 0.93*   Acute metabolic encephalopathy Presumed secondary to sepsis versus drug use  Mental status gradually improved.   Continue oxycodone 20 mg every 8 as before, Lyrica. On hold are bupropion, Cymbalta  Type 2 diabetes mellitus A1c 7.5 on March 8182 PTA on Trulicity weekly, glipizide 5 mg daily Lantus 12 units twice daily, sliding scale insulin.  Currently requiring less insulin than before. Post discharge, continue Lantus 12 units daily and sliding scale.  Can resume Trulicity.  I will keep glipizide on hold because of renal function impairment and risk of hypoglycemia. Recent Labs  Lab 01/30/22 0804 01/30/22 1618 01/30/22 2125  01/31/22 0801 01/31/22 1217  GLUCAP 150* 183* 147* 141* 161*    Chronic diastolic CHF Essential hypertension -Echo with EF 55 to 60%, normal LV systolic function, grade 1 diastolic dysfunction, normal RV systolic function  -PTA on Lopressor, Norvasc.  Continue same.  Resume Lasix as before.  HLD Continue Crestor   BPH tamsulosin   Chronic anemia due to chronic illness Hemoglobin slightly low and stable.  Continue monitor Recent Labs    01/24/22 1454 01/25/22 1259 01/26/22 1000 01/27/22 0831 01/28/22 0549  HGB 11.4* 10.5* 10.0* 9.6* 10.1*  MCV 90.0 93.8 93.2 91.3 91.5   Chronic left leg wound and lymphedema WOC nurse evaluated and recommending long-term management of lymphedema with compression and manual massage as   Morbid obesity  Body mass index is 32.41 kg/m. Patient has been advised to make an attempt to improve diet and exercise patterns to aid in weight loss.  Wounds:  - Wound / Incision (Open or Dehisced) 06/26/21 Non-pressure wound Thigh Left;Posterior;Proximal (Active)  Date First Assessed/Time First Assessed: 06/26/21 0445   Wound Type: Non-pressure wound  Location: Thigh  Location Orientation: Left;Posterior;Proximal  Present on Admission: Yes    Assessments 06/26/2021  7:39 AM 07/05/2021  8:24 PM  Dressing Type Foam -  Lift dressing to assess site every shift --  Dressing Status Clean;Dry;Intact Clean;Intact;Dry  Dressing Change Frequency Every 3 days Daily  Site / Wound Assessment Dressing in place / Unable to assess Dressing in place / Unable to assess  Peri-wound Assessment Intact Intact  Drainage Amount None --     No associated orders.     Wound / Incision (Open or Dehisced) 09/05/21 Non-pressure wound Thigh Left;Posterior pink (Active)  Date First Assessed/Time First Assessed: 09/05/21 1048   Wound Type: Non-pressure wound  Location: Thigh  Location Orientation: Left;Posterior  Wound Description (Comments): pink  Present on Admission: Yes     Assessments 09/06/2021 11:50 PM 09/08/2021  8:00 AM  Dressing Type Foam - Lift dressing to assess site every shift Foam - Lift dressing to assess site every shift  Dressing Changed -- New  Dressing Status Clean, Dry, Intact Clean, Dry, Intact     No associated orders.     Wound / Incision (Open or Dehisced) 09/05/21 Non-pressure wound Thigh Left;Posterior pink (Active)  Date First Assessed/Time First Assessed: 09/05/21 1048   Wound Type: Non-pressure wound  Location: Thigh  Location Orientation: Left;Posterior  Wound Description (Comments): pink  Present on Admission: Yes    Assessments 09/06/2021 11:50 PM 09/08/2021  8:00 AM  Dressing Type Foam - Lift dressing to assess site every shift Foam - Lift dressing to assess site every shift  Dressing Changed -- Changed  Dressing Status Clean, Dry, Intact Clean, Dry, Intact     No associated orders.     Pressure Injury Buttocks Right Deep Tissue Pressure Injury - Purple or maroon localized area of discolored intact skin or blood-filled blister due to damage of underlying soft tissue from pressure and/or shear. (Active)  No Date First Assessed or Time First Assessed found.   Location: Buttocks  Location Orientation: Right  Staging: Deep Tissue Pressure Injury - Purple or maroon localized area of discolored intact skin or blood-filled blister due to damage of underlyin...    Assessments 09/06/2021 11:50 PM 09/08/2021  8:00 AM  Dressing Type Foam - Lift dressing to assess site every shift Foam - Lift dressing to assess site every shift  Dressing Clean, Dry, Intact Clean, Dry, Intact     No associated orders.     Wound / Incision (Open or Dehisced) 09/05/21 Non-pressure wound Thigh Left;Posterior pink (Active)  Date First Assessed/Time First Assessed: 09/05/21 1048   Wound Type: Non-pressure wound  Location: Thigh  Location Orientation: Left;Posterior  Wound Description (Comments): pink  Present on Admission: Yes    Assessments 09/06/2021 11:50 PM  09/08/2021  8:00 AM  Dressing Type Foam - Lift dressing to assess site every shift Foam - Lift dressing to assess site every shift  Dressing Changed -- Changed  Dressing Status Clean, Dry, Intact Clean, Dry, Intact     No associated orders.    Discharge Exam:   Vitals:   01/30/22 1021 01/30/22 2130 01/31/22 0511 01/31/22 1220  BP: (!) 146/77 (!) 161/69 (!) 145/65 (!) 170/67  Pulse: 62 69 (!) 59 (!) 51  Resp:  20 20 18   Temp:  99.1 F (37.3 C)  98 F (36.7 C)  TempSrc:  Oral  Oral  SpO2:  92% 95% 90%  Weight:      Height:        Body mass index is 32.41 kg/m.   General exam: Pleasant middle-aged male, looks older than his stated age Skin: No rashes, lesions or ulcers. HEENT: Subsiding swelling of the right  side of the face. Right periorbital swelling much improved Lungs: Clear to auscultation bilaterally CVS: Regular rate and rhythm, no murmur GI/Abd soft, nontender, nondistended, bowel sound present CNS: Alert, awake, oriented x3 Psychiatry: Mood appropriate Extremities: Prior right AKA, left lower extremity swelling with chronic stasis changes  Follow ups:    Contact information for follow-up providers     [name], MD Follow up.   Specialty: Internal Medicine Contact information: [place] [address] [phone]              Contact information for after-discharge care     Destination     [facility] Preferred SNF .   Service: Skilled Nursing Contact information: [address] [place] [phone]                     Discharge Instructions:   Discharge Instructions     Call MD for:  difficulty breathing, headache or visual disturbances   Complete by: As directed    Call MD for:  extreme fatigue   Complete by: As directed    Call MD for:  hives   Complete by: As directed    Call MD for:  persistant dizziness or light-headedness   Complete by: As  directed    Call MD for:  persistant nausea and vomiting   Complete by: As directed    Call MD for:  severe uncontrolled pain   Complete by: As directed    Call MD for:  temperature >100.4   Complete by: As directed    Diet Carb Modified   Complete by: As directed    Diet general   Complete by: As directed    Discharge instructions   Complete by: As directed    Recommendations at discharge:   Complete the antibiotic course in next 4 days  Discharge instructions for diabetes mellitus: Check blood sugar 3 times a day and bedtime at home. If blood sugar running above 200 or less than 70 please call your MD to adjust insulin. If you notice signs and symptoms of hypoglycemia (low blood sugar) like jitteriness, confusion, thirst, tremor and sweating, please check blood sugar, drink sugary drink/biscuits/sweets to increase sugar level and call MD or return to ER.    General discharge instructions: Follow with Primary MD [name], MD in 7 days  Please request your PCP  to go over your hospital tests, procedures, radiology results at the follow up. Please get your medicines reviewed and adjusted.  Your PCP may decide to repeat certain labs or tests as needed. Do not drive, operate heavy machinery, perform activities at heights, swimming or participation in water activities or provide baby sitting services if your were admitted for syncope or siezures until you have seen by Primary MD or a Neurologist and advised to do so again. [place] Controlled Substance Reporting System database was reviewed. Do not drive, operate heavy machinery, perform activities at heights, swim, participate in water activities or provide baby-sitting services while on medications for pain, sleep and mood until your outpatient physician has reevaluated you and advised to do so again.  You are strongly recommended to comply with the dose, frequency and duration of prescribed medications. Activity: As  tolerated with Full fall precautions use walker/cane & assistance as needed Avoid using any recreational substances like cigarette, tobacco, alcohol, or non-prescribed drug. If you experience worsening of your admission symptoms, develop shortness of breath, life threatening emergency,  suicidal or homicidal thoughts you must seek medical attention immediately by calling 911 or calling your MD immediately  if symptoms less severe. You must read complete instructions/literature along with all the possible adverse reactions/side effects for all the medicines you take and that have been prescribed to you. Take any new medicine only after you have completely understood and accepted all the possible adverse reactions/side effects.  Wear Seat belts while driving. You were cared for by a hospitalist during your hospital stay. If you have any questions about your discharge medications or the care you received while you were in the hospital after you are discharged, you can call the unit and ask to speak with the hospitalist or the covering physician. Once you are discharged, your primary care physician will handle any further medical issues. Please note that NO REFILLS for any discharge medications will be authorized once you are discharged, as it is imperative that you return to your primary care physician (or establish a relationship with a primary care physician if you do not have one).   Increase activity slowly   Complete by: As directed        Discharge Medications:   Allergies as of 01/31/2022       Reactions   Latex Other (See Comments)   "ALLERGIC," per [facility]        Medication List     STOP taking these medications    azithromycin 250 MG tablet Commonly known as: ZITHROMAX   buPROPion 300 MG 24 hr tablet Commonly known as: WELLBUTRIN XL   DULoxetine 60 MG capsule Commonly known as: CYMBALTA   glipiZIDE 10 MG tablet Commonly known as: GLUCOTROL       TAKE these medications     acetaminophen 325 MG tablet Commonly known as: TYLENOL Take 650 mg by mouth every 4 (four) hours as needed (pain- NOT TO EXCEED 3,000 mg/24 hours).   amLODipine 10 MG tablet Commonly known as: NORVASC Take 10 mg by mouth daily.   azelastine 0.1 % nasal spray Commonly known as: ASTELIN Place 1 spray into both nostrils daily.   Biofreeze 4 % Gel Generic drug: Menthol (Topical Analgesic) Apply 1 Application topically every 8 (eight) hours as needed (for pain).   cefadroxil 500 MG capsule Commonly known as: DURICEF Take 2 capsules (1,000 mg total) by mouth 2 (two) times daily for 4 days.   cyanocobalamin 1000 MCG tablet Commonly known as: VITAMIN B12 Take 1,000 mcg by mouth daily.   Dermacloud Oint Apply 1 Application topically in the morning and at bedtime. To buttocks   diclofenac Sodium 1 % Gel Commonly known as: VOLTAREN Apply 4 g topically every 8 (eight) hours as needed (for pain).   docusate sodium 100 MG capsule Commonly known as: COLACE Take 1 capsule (100 mg total) by mouth 2 (two) times daily.   Eszopiclone 3 MG Tabs Take 3 mg by mouth at bedtime.   ferrous sulfate 325 (65 FE) MG tablet Take 1 tablet (325 mg total) by mouth daily with breakfast.   fexofenadine 60 MG tablet Commonly known as: ALLEGRA Take 60 mg by mouth daily.   fluticasone 50 MCG/ACT nasal spray Commonly known as: FLONASE Place 1 spray into both nostrils daily.   furosemide 20 MG tablet Commonly known as: LASIX Take 20 mg by mouth See admin instructions. Take 20 mg by mouth in the morning only on Sun/Tues/Thurs/Sat   furosemide 40 MG tablet Commonly known as: LASIX Take 40 mg by mouth every Monday, Wednesday,  and Friday.   gabapentin 300 MG capsule Commonly known as: NEURONTIN Take 300 mg by mouth 3 (three) times daily.   GLUCAGON HCL IJ Inject 1 mg into the muscle once as needed (hypoglycemia).   guaiFENesin 600 MG 12 hr tablet Commonly known as: Mucinex Take 1 tablet (600  mg total) by mouth every 12 (twelve) hours for 8 days.   hydrOXYzine 25 MG tablet Commonly known as: ATARAX Take 25 mg by mouth every 6 (six) hours as needed for itching.   insulin lispro 100 UNIT/ML KwikPen Commonly known as: HUMALOG Inject 0-10 Units into the skin See admin instructions. Inject 0-10 units into the skin three times a day and at bedtime, PER SLIDING SCALE:  BGL 151-200 = 2 units 201-250 = 4 units 251-300 = 6 units 301-350 =  8 units 351-400 = 10 units   ipratropium-albuterol 0.5-2.5 (3) MG/3ML Soln Commonly known as: DUONEB Take 3 mLs by nebulization every 6 (six) hours.   lactulose 10 GM/15ML solution Commonly known as: CHRONULAC Take 15 mLs (10 g total) by mouth 3 (three) times daily as needed for moderate constipation.   Lantus SoloStar 100 UNIT/ML Solostar Pen Generic drug: insulin glargine Inject 12 Units into the skin daily. What changed: when to take this   levalbuterol 0.63 MG/3ML nebulizer solution Commonly known as: XOPENEX Take 3 mLs (0.63 mg total) by nebulization every 6 (six) hours as needed for wheezing or shortness of breath.   lidocaine 4 % Apply 1 patch topically daily.   Lubricant Eye Drops 0.4-0.3 % Soln Generic drug: Polyethyl Glycol-Propyl Glycol Place 2 drops into both eyes every 4 (four) hours as needed (dryness/itching).   magnesium oxide 400 MG tablet Commonly known as: MAG-OX Take 400 mg by mouth 2 (two) times daily.   melatonin 3 MG Tabs tablet Take 3 mg by mouth at bedtime.   methenamine 1 g tablet Commonly known as: MANDELAMINE Take 1 g by mouth daily.   metoprolol tartrate 25 MG tablet Commonly known as: LOPRESSOR Take 0.5 tablets (12.5 mg total) by mouth 2 (two) times daily.   multivitamin with minerals tablet Take 1 tablet by mouth daily.   Narcan 4 MG/0.1ML Liqd nasal spray kit Generic drug: naloxone Place 1 spray into the nose as needed (poorly responsive or turning blue).   nystatin cream Commonly known  as: MYCOSTATIN Apply 1 Application topically See admin instructions. 1 application to gluteal fold every shift for wound care   omeprazole 20 MG capsule Commonly known as: PRILOSEC Take 20 mg by mouth at bedtime.   ondansetron 4 MG tablet Commonly known as: ZOFRAN Take 4 mg by mouth every 8 (eight) hours as needed for nausea.   OVER THE COUNTER MEDICATION Take 30 mLs by mouth in the morning and at bedtime. Pro Heal   oxybutynin 10 MG 24 hr tablet Commonly known as: DITROPAN-XL Take 10 mg by mouth daily.   oxyCODONE 5 MG immediate release tablet Commonly known as: Oxy IR/ROXICODONE Take 1 tablet (5 mg total) by mouth every 8 (eight) hours as needed for severe pain (pain). What changed: reasons to take this   oxycodone 30 MG immediate release tablet Commonly known as: ROXICODONE Take 1 tablet (30 mg total) by mouth every 8 (eight) hours. What changed: Another medication with the same name was changed. Make sure you understand how and when to take each.   OXYGEN Inhale into the lungs as needed (for shortness of breath).   phenazopyridine 100 MG tablet Commonly known  as: PYRIDIUM Take 100 mg by mouth every 8 (eight) hours as needed (dysuria).   polyethylene glycol 17 g packet Commonly known as: MIRALAX / GLYCOLAX Take 17 g by mouth daily.   prednisoLONE acetate 1 % ophthalmic suspension Commonly known as: PRED FORTE Place 1 drop into the right eye every 4 (four) hours for 5 days.   pregabalin 150 MG capsule Commonly known as: LYRICA Take 1 capsule (150 mg total) by mouth 2 (two) times daily.   rOPINIRole 2 MG tablet Commonly known as: REQUIP Take 1 mg by mouth 3 (three) times daily.   rosuvastatin 10 MG tablet Commonly known as: CRESTOR Take 10 mg by mouth at bedtime.   senna-docusate 8.6-50 MG tablet Commonly known as: Senokot-S Take 1 tablet by mouth at bedtime.   Spiriva Respimat 1.25 MCG/ACT Aers Generic drug: Tiotropium Bromide Monohydrate Inhale 2 each  into the lungs in the morning.   tamsulosin 0.4 MG Caps capsule Commonly known as: FLOMAX Take 0.8 mg by mouth at bedtime.   tiZANidine 4 MG tablet Commonly known as: ZANAFLEX Take 4 mg by mouth every 6 (six) hours as needed for muscle spasms.   triamcinolone cream 0.1 % Commonly known as: KENALOG Apply 1 application  topically See admin instructions. Apply to left lower leg 2 times a day   trimethoprim-polymyxin b ophthalmic solution Commonly known as: POLYTRIM Place 1 drop into the right eye every 4 (four) hours for 5 days.   Trulicity 5.32 DJ/2.4QA Sopn Generic drug: Dulaglutide Inject 0.75 mg into the skin every Wednesday.         The results of significant diagnostics from this hospitalization (including imaging, microbiology, ancillary and laboratory) are listed below for reference.    Procedures and Diagnostic Studies:   ECHOCARDIOGRAM COMPLETE  Result Date: 01/25/2022    ECHOCARDIOGRAM REPORT   Patient Name:   PELHAM HENNICK Date of Exam: 01/25/2022 Medical Rec #:  [id number]    Height:       72.0 in Accession #:    [id number]   Weight:       251.3 lb Date of Birth:  Apr 05, 1961   BSA:          2.348 m Patient Age:    52 years     BP:           144/64 mmHg Patient Gender: M            HR:           92 bpm. Exam Location:  Inpatient Procedure: 2D Echo, Color Doppler, Cardiac Doppler and Intracardiac            Opacification Agent Indications:    R06.9 DOE  History:        Patient has prior history of Echocardiogram examinations, most                 recent 06/26/2021. CHF, Pulmonary HTN and COPD; Risk                 Factors:Hypertension, Diabetes and Dyslipidemia.  Sonographer:    Raquel Sarna Senior RDCS Referring Phys: [phone] WHITNEY D HARRIS  Sonographer Comments: Very poor echo windows due to patient body habitus and lung interference. IMPRESSIONS  1. Left ventricular ejection fraction, by estimation, is 55 to 60%. The left ventricle has normal function. The left ventricle has no  regional wall motion abnormalities. There is mild left ventricular hypertrophy. Left ventricular diastolic parameters are consistent with Grade I diastolic dysfunction (impaired  relaxation).  2. Right ventricular systolic function is normal. The right ventricular size is mildly enlarged. Tricuspid regurgitation signal is inadequate for assessing PA pressure.  3. The mitral valve is grossly normal. No evidence of mitral valve regurgitation. No evidence of mitral stenosis.  4. The aortic valve was not well visualized. Aortic valve regurgitation is not visualized. Aortic valve sclerosis is present, with no evidence of aortic valve stenosis.  5. The inferior vena cava is dilated in size with <50% respiratory variability, suggesting right atrial pressure of 15 mmHg. FINDINGS  Left Ventricle: Left ventricular ejection fraction, by estimation, is 55 to 60%. The left ventricle has normal function. The left ventricle has no regional wall motion abnormalities. Definity contrast agent was given IV to delineate the left ventricular  endocardial borders. The left ventricular internal cavity size was normal in size. There is mild left ventricular hypertrophy. Left ventricular diastolic parameters are consistent with Grade I diastolic dysfunction (impaired relaxation). Right Ventricle: The right ventricular size is mildly enlarged. No increase in right ventricular wall thickness. Right ventricular systolic function is normal. Tricuspid regurgitation signal is inadequate for assessing PA pressure. Left Atrium: Left atrial size was normal in size. Right Atrium: Right atrial size was normal in size. Pericardium: There is no evidence of pericardial effusion. Mitral Valve: The mitral valve is grossly normal. No evidence of mitral valve regurgitation. No evidence of mitral valve stenosis. Tricuspid Valve: The tricuspid valve is not well visualized. Tricuspid valve regurgitation is not demonstrated. No evidence of tricuspid stenosis.  Aortic Valve: The aortic valve was not well visualized. Aortic valve regurgitation is not visualized. Aortic valve sclerosis is present, with no evidence of aortic valve stenosis. Aortic valve mean gradient measures 7.0 mmHg. Aortic valve peak gradient measures 15.1 mmHg. Aortic valve area, by VTI measures 4.21 cm. Pulmonic Valve: The pulmonic valve was not well visualized. Pulmonic valve regurgitation is not visualized. No evidence of pulmonic stenosis. Aorta: The aortic root is normal in size and structure. Venous: The inferior vena cava is dilated in size with less than 50% respiratory variability, suggesting right atrial pressure of 15 mmHg. IAS/Shunts: No atrial level shunt detected by color flow Doppler.  LEFT VENTRICLE PLAX 2D LVIDd:         4.30 cm LVIDs:         2.60 cm LV PW:         1.10 cm LV IVS:        1.20 cm LVOT diam:     2.40 cm LV SV:         116 LV SV Index:   50 LVOT Area:     4.52 cm  RIGHT VENTRICLE RV S prime:     16.50 cm/s TAPSE (M-mode): 2.3 cm LEFT ATRIUM             Index        RIGHT ATRIUM           Index LA diam:        4.10 cm 1.75 cm/m   RA Area:     21.80 cm LA Vol (A2C):   55.5 ml 23.64 ml/m  RA Volume:   60.00 ml  25.56 ml/m LA Vol (A4C):   66.7 ml 28.41 ml/m LA Biplane Vol: 61.1 ml 26.02 ml/m  AORTIC VALVE AV Area (Vmax):    3.68 cm AV Area (Vmean):   4.06 cm AV Area (VTI):     4.21 cm AV Vmax:  194.00 cm/s AV Vmean:          117.000 cm/s AV VTI:            0.276 m AV Peak Grad:      15.1 mmHg AV Mean Grad:      7.0 mmHg LVOT Vmax:         158.00 cm/s LVOT Vmean:        105.000 cm/s LVOT VTI:          0.257 m LVOT/AV VTI ratio: 0.93  AORTA Ao Root diam: 3.50 cm  SHUNTS Systemic VTI:  0.26 m Systemic Diam: 2.40 cm Cherlynn Kaiser MD Electronically signed by Cherlynn Kaiser MD Signature Date/Time: 01/25/2022/3:36:49 PM    Final    CT MAXILLOFACIAL WO CONTRAST  Result Date: 01/24/2022 CLINICAL DATA:  Blunt facial trauma. EXAM: CT MAXILLOFACIAL WITHOUT CONTRAST  TECHNIQUE: Multidetector CT imaging of the maxillofacial structures was performed. Multiplanar CT image reconstructions were also generated. RADIATION DOSE REDUCTION: This exam was performed according to the departmental dose-optimization program which includes automated exposure control, adjustment of the mA and/or kV according to patient size and/or use of iterative reconstruction technique. COMPARISON:  Three CT 03/30/2021 FINDINGS: Patient had difficulty tolerating the exam there is moderate motion artifact. Osseous: Allowing for motion, no evidence of acute fracture. No bony destructive process. Orbits: No orbital fracture. No evidence of globe injury. There may be mild proptosis. Sinuses: Trace mucosal thickening of ethmoid air cells. Small mucous retention cyst in the left maxillary sinus. No sinus fracture. Soft tissues: Right face/cheek soft tissue mass with biopsy clips. This is grossly stable from prior, although comparison is challenging due to motion on the current exam and obliquity in scan plane. Limited intracranial: Assessed on concurrent head CT, reported separately. IMPRESSION: 1. No acute facial bone fracture allowing for motion artifact. 2. Right face/cheek soft tissue mass with biopsy clips, grossly stable from prior, although comparison is challenging due to motion on the current exam and obliquity in scan plane. Electronically Signed   By: Keith Rake M.D.   On: 01/24/2022 21:03   CT HEAD WO CONTRAST (5MM)  Result Date: 01/24/2022 CLINICAL DATA:  Delirium. EXAM: CT HEAD WITHOUT CONTRAST TECHNIQUE: Contiguous axial images were obtained from the base of the skull through the vertex without intravenous contrast. RADIATION DOSE REDUCTION: This exam was performed according to the departmental dose-optimization program which includes automated exposure control, adjustment of the mA and/or kV according to patient size and/or use of iterative reconstruction technique. COMPARISON:  None  Available. FINDINGS: Brain: Patient's head was not straight in the scanner, scanned in oblique plane. No acute intracranial hemorrhage. No evidence of acute ischemia. Normal brain volume. No hydrocephalus, the basilar cisterns are patent. There is no subdural or extra-axial collection. No evidence of mass lesion or intracranial mass. Vascular: Atherosclerosis of skullbase vasculature without hyperdense vessel or abnormal calcification. Skull: No fracture or focal lesion. Sinuses/Orbits: Assessed on concurrent face CT, reported separately Other: No scalp soft tissue abnormality. IMPRESSION: No acute intracranial abnormality. Electronically Signed   By: Keith Rake M.D.   On: 01/24/2022 20:57   DG Chest Port 1 View  Result Date: 01/24/2022 CLINICAL DATA:  Questionable sepsis - evaluate for abnormality EXAM: PORTABLE CHEST 1 VIEW COMPARISON:  September 05, 2021 FINDINGS: Study is suboptimal due to overlying question warmer blanket. Cardiopericardial silhouette is enlarged, stable. No consolidation or pleural effusion seen. Ill-defined patchy opacities at the left mid and right lower lung zone. The visualized skeletal structures  are unremarkable. IMPRESSION: Mild cardiomegaly. Ill-defined opacities at the left mid lung and right lower lung suspicious for infiltrations and are more conspicuous in the present study. Electronically Signed   By: Frazier Richards M.D.   On: 01/24/2022 16:27     Labs:   Basic Metabolic Panel: Recent Labs  Lab 01/24/22 1454 01/25/22 1259 01/26/22 1000 01/27/22 0831 01/28/22 0549  NA 137 137 138 138 142  K 5.1 4.2 4.4 4.7 4.4  CL 104 104 107 105 109  CO2 24 24 24 25 25   GLUCOSE 164* 119* 121* 126* 167*  BUN 30* 33* 41* 36* 29*  CREATININE 2.21* 2.18* 2.52* 2.02* 1.75*  CALCIUM 9.1 8.3* 8.6* 8.7* 8.7*  MG  --   --  2.1 2.2 2.0  PHOS  --   --  4.1 4.0 4.6   GFR Estimated Creatinine Clearance: 55.7 mL/min (A) (by C-G formula based on SCr of 1.75 mg/dL (H)). Liver  Function Tests: Recent Labs  Lab 01/24/22 1454 01/26/22 1000 01/27/22 0831 01/28/22 0549  AST 15 54* 33 20  ALT 12 40 33 24  ALKPHOS 76 60 57 57  BILITOT 0.6 0.7 0.5 0.4  PROT 7.7 6.3* 6.8 6.4*  ALBUMIN 3.6 2.9* 3.0* 2.9*   No results for input(s): "LIPASE", "AMYLASE" in the last 168 hours. No results for input(s): "AMMONIA" in the last 168 hours. Coagulation profile Recent Labs  Lab 01/24/22 1454  INR 1.0    CBC: Recent Labs  Lab 01/24/22 1454 01/25/22 1259 01/26/22 1000 01/27/22 0831 01/28/22 0549  WBC 13.9* 12.8* 6.7 6.2 5.6  NEUTROABS 12.3*  --  5.2 4.1 3.6  HGB 11.4* 10.5* 10.0* 9.6* 10.1*  HCT 36.7* 34.8* 33.0* 31.3* 32.5*  MCV 90.0 93.8 93.2 91.3 91.5  PLT 212 155 131* 141* 144*   Cardiac Enzymes: Recent Labs  Lab 01/24/22 1644  CKTOTAL 207   BNP: Invalid input(s): "POCBNP" CBG: Recent Labs  Lab 01/30/22 0804 01/30/22 1618 01/30/22 2125 01/31/22 0801 01/31/22 1217  GLUCAP 150* 183* 147* 141* 161*   D-Dimer No results for input(s): "DDIMER" in the last 72 hours. Hgb A1c No results for input(s): "HGBA1C" in the last 72 hours. Lipid Profile No results for input(s): "CHOL", "HDL", "LDLCALC", "TRIG", "CHOLHDL", "LDLDIRECT" in the last 72 hours. Thyroid function studies No results for input(s): "TSH", "T4TOTAL", "T3FREE", "THYROIDAB" in the last 72 hours.  Invalid input(s): "FREET3" Anemia work up No results for input(s): "VITAMINB12", "FOLATE", "FERRITIN", "TIBC", "IRON", "RETICCTPCT" in the last 72 hours. Microbiology Recent Results (from the past 240 hour(s))  Resp Panel by RT-PCR (Flu A&B, Covid) In/Out Cath Urine     Status: None   Collection Time: 01/24/22  2:52 PM   Specimen: In/Out Cath Urine; Nasal Swab  Result Value Ref Range Status   SARS Coronavirus 2 by RT PCR NEGATIVE NEGATIVE Final    Comment: (NOTE) SARS-CoV-2 target nucleic acids are NOT DETECTED.  The SARS-CoV-2 RNA is generally detectable in upper respiratory specimens  during the acute phase of infection. The lowest concentration of SARS-CoV-2 viral copies this assay can detect is 138 copies/mL. A negative result does not preclude SARS-Cov-2 infection and should not be used as the sole basis for treatment or other patient management decisions. A negative result may occur with  improper specimen collection/handling, submission of specimen other than nasopharyngeal swab, presence of viral mutation(s) within the areas targeted by this assay, and inadequate number of viral copies(<138 copies/mL). A negative result must be combined with clinical  observations, patient history, and epidemiological information. The expected result is Negative.  Fact Sheet for Patients:  EntrepreneurPulse.com.au  Fact Sheet for Healthcare Providers:  IncredibleEmployment.be  This test is no t yet approved or cleared by the [place] FDA and  has been authorized for detection and/or diagnosis of SARS-CoV-2 by FDA under an Emergency Use Authorization (EUA). This EUA will remain  in effect (meaning this test can be used) for the duration of the COVID-19 declaration under Section 564(b)(1) of the Act, 21 U.S.C.section 360bbb-3(b)(1), unless the authorization is terminated  or revoked sooner.       Influenza A by PCR NEGATIVE NEGATIVE Final   Influenza B by PCR NEGATIVE NEGATIVE Final    Comment: (NOTE) The Xpert Xpress SARS-CoV-2/FLU/RSV plus assay is intended as an aid in the diagnosis of influenza from Nasopharyngeal swab specimens and should not be used as a sole basis for treatment. Nasal washings and aspirates are unacceptable for Xpert Xpress SARS-CoV-2/FLU/RSV testing.  Fact Sheet for Patients: EntrepreneurPulse.com.au  Fact Sheet for Healthcare Providers: IncredibleEmployment.be  This test is not yet approved or cleared by the [place] FDA and has been authorized for detection  and/or diagnosis of SARS-CoV-2 by FDA under an Emergency Use Authorization (EUA). This EUA will remain in effect (meaning this test can be used) for the duration of the COVID-19 declaration under Section 564(b)(1) of the Act, 21 U.S.C. section 360bbb-3(b)(1), unless the authorization is terminated or revoked.  Performed at [facility], [place] [address] [place], [place] [address]   Urine Culture     Status: Abnormal   Collection Time: 01/24/22  2:52 PM   Specimen: In/Out Cath Urine  Result Value Ref Range Status   Specimen Description   Final    IN/OUT CATH URINE Performed at [facility] [address] [place], [place] [address]    Special Requests   Final    NONE Performed at [facility], [place] [address] [place], [place] [address]    Culture MULTIPLE SPECIES PRESENT, SUGGEST RECOLLECTION (A)  Final   Report Status 01/26/2022 FINAL  Final  Blood Culture (routine x 2)     Status: Abnormal   Collection Time: 01/24/22  2:54 PM   Specimen: BLOOD  Result Value Ref Range Status   Specimen Description   Final    BLOOD SITE NOT SPECIFIED Performed at [facility] [address], [place], [place] [address]    Special Requests   Final    BOTTLES DRAWN AEROBIC AND ANAEROBIC Blood Culture adequate volume Performed at [facility] [address] [place],  [address]    Culture  Setup Time   Final    GRAM POSITIVE COCCI IN CHAINS IN BOTH AEROBIC AND ANAEROBIC BOTTLES CRITICAL RESULT CALLED TO, READ BACK BY AND VERIFIED WITH:  C/ PHARMD MARY S. 01/25/22 0659 A. LAFRANCE Performed at [facility], [place] [address] [place], [place] [address]    Culture STREPTOCOCCUS GROUP G (A)  Final   Report Status 01/27/2022 FINAL  Final   Organism ID, Bacteria STREPTOCOCCUS GROUP G  Final      Susceptibility   Streptococcus group g - MIC*    CLINDAMYCIN >=1 RESISTANT Resistant      AMPICILLIN <=0.25 SENSITIVE Sensitive     ERYTHROMYCIN >=8 RESISTANT Resistant     VANCOMYCIN 0.5 SENSITIVE Sensitive     CEFTRIAXONE <=0.12 SENSITIVE Sensitive     LEVOFLOXACIN 0.5 SENSITIVE Sensitive     *  STREPTOCOCCUS GROUP G  Blood Culture ID Panel (Reflexed)     Status: Abnormal   Collection Time: 01/24/22  2:54 PM  Result Value Ref Range Status   Enterococcus faecalis NOT DETECTED NOT DETECTED Final   Enterococcus Faecium NOT DETECTED NOT DETECTED Final   Listeria monocytogenes NOT DETECTED NOT DETECTED Final   Staphylococcus species NOT DETECTED NOT DETECTED Final   Staphylococcus aureus (BCID) NOT DETECTED NOT DETECTED Final   Staphylococcus epidermidis NOT DETECTED NOT DETECTED Final   Staphylococcus lugdunensis NOT DETECTED NOT DETECTED Final   Streptococcus species DETECTED (A) NOT DETECTED Final    Comment: Not Enterococcus species, Streptococcus agalactiae, Streptococcus pyogenes, or Streptococcus pneumoniae. CRITICAL RESULT CALLED TO, READ BACK BY AND VERIFIED WITH:  C/ PHARMD MARY S. 01/25/22 0659 A. LAFRANCE    Streptococcus agalactiae NOT DETECTED NOT DETECTED Final   Streptococcus pneumoniae NOT DETECTED NOT DETECTED Final   Streptococcus pyogenes NOT DETECTED NOT DETECTED Final   A.calcoaceticus-baumannii NOT DETECTED NOT DETECTED Final   Bacteroides fragilis NOT DETECTED NOT DETECTED Final   Enterobacterales NOT DETECTED NOT DETECTED Final   Enterobacter cloacae complex NOT DETECTED NOT DETECTED Final   Escherichia coli NOT DETECTED NOT DETECTED Final   Klebsiella aerogenes NOT DETECTED NOT DETECTED Final   Klebsiella oxytoca NOT DETECTED NOT DETECTED Final   Klebsiella pneumoniae NOT DETECTED NOT DETECTED Final   Proteus species NOT DETECTED NOT DETECTED Final   Salmonella species NOT DETECTED NOT DETECTED Final   Serratia marcescens NOT DETECTED NOT DETECTED Final   Haemophilus influenzae NOT DETECTED NOT DETECTED Final   Neisseria meningitidis NOT DETECTED  NOT DETECTED Final   Pseudomonas aeruginosa NOT DETECTED NOT DETECTED Final   Stenotrophomonas maltophilia NOT DETECTED NOT DETECTED Final   Candida albicans NOT DETECTED NOT DETECTED Final   Candida auris NOT DETECTED NOT DETECTED Final   Candida glabrata NOT DETECTED NOT DETECTED Final   Candida krusei NOT DETECTED NOT DETECTED Final   Candida parapsilosis NOT DETECTED NOT DETECTED Final   Candida tropicalis NOT DETECTED NOT DETECTED Final   Cryptococcus neoformans/gattii NOT DETECTED NOT DETECTED Final    Comment: Performed at [facility], [address] [place], [place] [address]  Blood Culture (routine x 2)     Status: None   Collection Time: 01/24/22  7:29 PM   Specimen: BLOOD  Result Value Ref Range Status   Specimen Description   Final    BLOOD BLOOD RIGHT HAND Performed at [facility] [address], [place], [place] [address]    Special Requests   Final    IN PEDIATRIC BOTTLE Blood Culture adequate volume Performed at [facility] [address], [place], [place] [address]    Culture   Final    NO GROWTH 5 DAYS Performed at [facility], [place] [address] [place], [place] [address]    Report Status 01/29/2022 FINAL  Final  MRSA Next Gen by PCR, Nasal     Status: Abnormal   Collection Time: 01/24/22  7:36 PM   Specimen: Nasal Mucosa; Nasal Swab  Result Value Ref Range Status   MRSA by PCR Next Gen DETECTED (A) NOT DETECTED Final    Comment: CRITICAL RESULT CALLED TO, READ BACK BY AND VERIFIED WITH: ODH,T AT 2241 ON 01/24/22 BY [id number] (NOTE) The GeneXpert MRSA Assay (FDA approved for NASAL specimens only), is one component of a comprehensive MRSA colonization surveillance program. It is not intended to diagnose MRSA infection nor to  guide or monitor treatment for MRSA infections. Test performance is not FDA approved in patients less than 62 years old. Performed at [facility], [place] [address], [place], [place] [address]   Culture, blood (Routine X 2) w Reflex to ID Panel     Status: None   Collection Time: 01/25/22  4:08 PM   Specimen: BLOOD  Result Value Ref Range Status   Specimen Description   Final    BLOOD BLOOD RIGHT HAND Performed at [facility] [address], [place], [place] [address]    Special Requests   Final    BOTTLES DRAWN AEROBIC ONLY Blood Culture adequate volume Performed at [facility] [address] [place], [place] [address]    Culture   Final    NO GROWTH 5 DAYS Performed at [facility], [place] [address] [place], [place] [address]    Report Status 01/30/2022 FINAL  Final  Culture, blood (Routine X 2) w Reflex to ID Panel     Status: None   Collection Time: 01/25/22  4:10 PM   Specimen: BLOOD  Result Value Ref Range Status   Specimen Description   Final    BLOOD BLOOD RIGHT WRIST Performed at [facility] [address], [place], [place] [address]    Special Requests   Final    BOTTLES DRAWN AEROBIC ONLY Blood Culture adequate volume Performed at [facility] [address] [place], [place] [address]    Culture   Final    NO GROWTH 5 DAYS Performed at [facility], [place] [address] [place], [place] [address]    Report Status 01/30/2022 FINAL  Final    Time coordinating discharge: 35 minutes  Signed: Emanuella Nickle  [facility] 01/31/2022, 1:25 PM

## 2022-01-31 NOTE — Plan of Care (Signed)
  Problem: Health Behavior/Discharge Planning: Goal: Ability to manage health-related needs will improve Outcome: Progressing   Problem: Clinical Measurements: Goal: Ability to maintain clinical measurements within normal limits will improve Outcome: Progressing Goal: Will remain free from infection Outcome: Progressing Goal: Diagnostic test results will improve Outcome: Progressing Goal: Respiratory complications will improve Outcome: Progressing Goal: Cardiovascular complication will be avoided Outcome: Progressing   Problem: Activity: Goal: Risk for activity intolerance will decrease Outcome: Progressing   Problem: Nutrition: Goal: Adequate nutrition will be maintained Outcome: Progressing   Problem: Coping: Goal: Level of anxiety will decrease Outcome: Progressing   Problem: Elimination: Goal: Will not experience complications related to bowel motility Outcome: Progressing Goal: Will not experience complications related to urinary retention Outcome: Progressing   Problem: Pain Managment: Goal: General experience of comfort will improve Outcome: Progressing   Problem: Safety: Goal: Ability to remain free from injury will improve Outcome: Progressing   Problem: Skin Integrity: Goal: Risk for impaired skin integrity will decrease Outcome: Progressing   Problem: Education: Goal: Ability to describe self-care measures that may prevent or decrease complications (Diabetes Survival Skills Education) will improve Outcome: Progressing Goal: Individualized Educational Video(s) Outcome: Progressing   Problem: Coping: Goal: Ability to adjust to condition or change in health will improve Outcome: Progressing   Problem: Fluid Volume: Goal: Ability to maintain a balanced intake and output will improve Outcome: Progressing   Problem: Health Behavior/Discharge Planning: Goal: Ability to identify and utilize available resources and services will improve Outcome:  Progressing Goal: Ability to manage health-related needs will improve Outcome: Progressing   Problem: Metabolic: Goal: Ability to maintain appropriate glucose levels will improve Outcome: Progressing   Problem: Nutritional: Goal: Maintenance of adequate nutrition will improve Outcome: Progressing Goal: Progress toward achieving an optimal weight will improve Outcome: Progressing   Problem: Skin Integrity: Goal: Risk for impaired skin integrity will decrease Outcome: Progressing   Problem: Tissue Perfusion: Goal: Adequacy of tissue perfusion will improve Outcome: Progressing   Problem: Fluid Volume: Goal: Hemodynamic stability will improve Outcome: Progressing   Problem: Clinical Measurements: Goal: Diagnostic test results will improve Outcome: Progressing Goal: Signs and symptoms of infection will decrease Outcome: Progressing   Problem: Respiratory: Goal: Ability to maintain adequate ventilation will improve Outcome: Progressing

## 2022-01-31 NOTE — TOC Transition Note (Signed)
Transition of Care [facility]) - CM/SW Discharge Note   Patient Details  Name: Gregory [name] MRN: [id number] Date of Birth: [birth year]  [facility] CM/SW Contact:  Vassie Moselle, LCSW Phone Number: 01/31/2022, 2:07 PM   Clinical Narrative:    Pt to return to [facility] room 155A. Pt has no DME needs identified. Pt to be transported to facility by [facility]. RN to call report to [phone] and ask for [name]. [facility] called at [phone] for transportation.    Final next level of care: Long Term Nursing Home Barriers to Discharge: Barriers Resolved   Patient Goals and CMS Choice Patient states their goals for this hospitalization and ongoing recovery are:: Return [facility] CMS Medicare.gov Compare Post Acute Care list provided to:: Patient Represenative (must comment) ([name]) Choice offered to / list presented to : Adult Children  Discharge Placement              Patient chooses bed at: Other - please specify in the comment section below: ([facility]) Patient to be transferred to facility by: [facility]      Discharge Plan and Services   Discharge Planning Services: CM Consult Post Acute Care Choice: Nursing Home          DME Arranged: N/A DME Agency: NA                  Social Determinants of Health (SDOH) Interventions     Readmission Risk Interventions    01/31/2022    2:06 PM 01/25/2022    3:01 PM 07/03/2021    2:54 PM  Readmission Risk Prevention Plan  Transportation Screening Complete  Complete  Medication Review (RN Care Manager) Complete Complete Referral to Pharmacy  PCP or Specialist appointment within 3-5 days of discharge Complete Complete Complete  HRI or Home Care Consult Complete Complete Complete  SW Recovery Care/Counseling Consult Complete Complete Complete  Palliative Care Screening Not Applicable Not Applicable Not Applicable  Skilled Nursing Facility Complete Complete Complete

## 2022-01-31 NOTE — Progress Notes (Addendum)
[facility] for Infectious Disease  Date of Admission:  01/24/2022     Principal Problem:   Sepsis [facility])          Assessment: 61 year old male admitted with sepsis found to have strep bacteremia.  #Group G strep bacteremia #Right preseptal cellulitis #LeFt leg wounds -8/9 Bcx 2/2 Group  strep. 8/10 Bcx 2/2 no growth - CT orbit on 8/12 showed sizable soft tissue mass on right maxilla increased to 17 mm in AP diameter previously 43m in 2021.  Slight increase skin thickening on the lateral margin of the right eyelid and inferior eyelid may be related to patient's melanoma. -Patient remains afebrile on ceftriaxone - TTE showed no a vegetations, okay to forego TEE given the quick clearance of bacteremia. -Suspect bacteremia secondary to preseptal cellulitis vs LLE (chronic wounds). Patient has a history of melanoma status post radiation.  Noted to have wounds on the left side of his face extending into the neck.  Unclear if pt would like to pursue  follow-up with oncology. -Opthomology Dr. GTobe Sosreviewed case and concern for possible pre=septal cellulitis. If any worsening then consider CT orbits w/con  Recommendations: -D/C ceftriaxone -Start cefadroxil 1gm PO bid x 10 days(EOT 8/25) which will provide treatment for Group g strep bacteremia(2 weeks form negative Cx) and preseptal cellulitis. His right erythema has significantly improved, lower eyefold edema is improving.  -Follow-up in ID clinic on 8/29  ID will sign off  Microbiology:   Antibiotics: Ceftriaxone 8/12-p Blood 8/9 1/2 goup g strep 8/10 2.2 NG  SUBJECTIVE: Aferbrile overnight. No new complaints. Pt reports eye is improving. No vision defects reprorted  Review of Systems: Review of Systems  All other systems reviewed and are negative.    Scheduled Meds:  amLODipine  10 mg Oral Daily   cefadroxil  500 mg Oral BID   fluticasone  1 spray Each Nare Daily   guaiFENesin  1,200 mg Oral BID    heparin  5,000 Units Subcutaneous Q8H   insulin aspart  0-15 Units Subcutaneous TID AC & HS   insulin glargine-yfgn  10 Units Subcutaneous QHS   loratadine  10 mg Oral Daily   metoprolol tartrate  12.5 mg Oral BID   multivitamin with minerals  1 tablet Oral Daily   nicotine  21 mg Transdermal Daily   oxyCODONE  30 mg Oral Q8H   pantoprazole  40 mg Oral Daily   polyethylene glycol  17 g Oral Daily   prednisoLONE acetate  1 drop Right Eye Q4H   pregabalin  150 mg Oral BID   rosuvastatin  10 mg Oral QHS   senna-docusate  1 tablet Oral QHS   tamsulosin  0.4 mg Oral QPM   trimethoprim-polymyxin b  1 drop Right Eye Q4H   Continuous Infusions:  sodium chloride 10 mL/hr at 01/29/22 1207   PRN Meds:.sodium chloride, acetaminophen, ALPRAZolam, alum & mag hydroxide-simeth, docusate sodium, hydrALAZINE, hydrocerin, HYDROmorphone (DILAUDID) injection, ipratropium, labetalol, lactulose, levalbuterol, mouth rinse, polyethylene glycol, polyvinyl alcohol, tiZANidine, zolpidem Allergies  Allergen Reactions   Latex Other (See Comments)    "ALLERGIC," per MAR    OBJECTIVE: Vitals:   01/30/22 0310 01/30/22 1021 01/30/22 2130 01/31/22 0511  BP: (!) 160/77 (!) 146/77 (!) 161/69 (!) 145/65  Pulse: 68 62 69 (!) 59  Resp:   20 20  Temp: 98.2 F (36.8 C)  99.1 F (37.3 C)   TempSrc: Oral  Oral   SpO2:   92%  95%  Weight:      Height:       Body mass index is 32.41 kg/m.  Physical Exam Constitutional:      General: He is not in acute distress.    Appearance: He is normal weight. He is not toxic-appearing.  HENT:     Head: Normocephalic and atraumatic.     Comments: Low eyelid swelling    Right Ear: External ear normal.     Left Ear: External ear normal.     Nose: No congestion or rhinorrhea.     Mouth/Throat:     Mouth: Mucous membranes are moist.     Pharynx: Oropharynx is clear.  Eyes:     Extraocular Movements: Extraocular movements intact.     Conjunctiva/sclera: Conjunctivae  normal.     Pupils: Pupils are equal, round, and reactive to light.  Cardiovascular:     Rate and Rhythm: Normal rate and regular rhythm.     Heart sounds: No murmur heard.    No friction rub. No gallop.  Pulmonary:     Effort: Pulmonary effort is normal.     Breath sounds: Normal breath sounds.  Abdominal:     General: Abdomen is flat. Bowel sounds are normal.     Palpations: Abdomen is soft.  Musculoskeletal:        General: No swelling. Normal range of motion.     Cervical back: Normal range of motion and neck supple.  Skin:    General: Skin is warm and dry.     Comments: right periorbital erythema Left leg wounds  Neurological:     General: No focal deficit present.     Mental Status: He is oriented to person, place, and time.  Psychiatric:        Mood and Affect: Mood normal.       Lab Results Lab Results  Component Value Date   WBC 5.6 01/28/2022   HGB 10.1 (L) 01/28/2022   HCT 32.5 (L) 01/28/2022   MCV 91.5 01/28/2022   PLT 144 (L) 01/28/2022    Lab Results  Component Value Date   CREATININE 1.75 (H) 01/28/2022   BUN 29 (H) 01/28/2022   NA 142 01/28/2022   K 4.4 01/28/2022   CL 109 01/28/2022   CO2 25 01/28/2022    Lab Results  Component Value Date   ALT 24 01/28/2022   AST 20 01/28/2022   ALKPHOS 57 01/28/2022   BILITOT 0.4 01/28/2022        Laurice Record, [facility] 01/31/2022, 11:32 AM

## 2022-01-31 NOTE — Progress Notes (Signed)
PIV's removed. Discharge instructions placed in packet for facility.  Patient belongings gathered and packed to discharge.

## 2022-02-13 ENCOUNTER — Inpatient Hospital Stay: Payer: Medicaid Other | Admitting: Internal Medicine

## 2022-02-15 ENCOUNTER — Telehealth: Payer: Self-pay

## 2022-02-15 NOTE — Telephone Encounter (Signed)
I have spoken with the pt and advised as indicated. Pt expressed understanding of this information.  

## 2022-02-15 NOTE — Telephone Encounter (Signed)
Pt called wanting to know if she needs to be seen here in the [facility]. He states he has a couple CT scans while at the hospital and would like Dr. Alen Blew to review them and advise.  Review of pts chart indicates his CT scans were on 01/27/22.

## 2022-02-16 ENCOUNTER — Telehealth: Payer: Self-pay | Admitting: Oncology

## 2022-02-16 NOTE — Telephone Encounter (Signed)
Scheduled per 08/31 scheduled message, called patient's two different numbers and was unable to leave a voicemail at both. Called daughter, left a voicemail. Calender will be mailed.

## 2022-03-07 ENCOUNTER — Telehealth: Payer: Self-pay | Admitting: *Deleted

## 2022-03-07 NOTE — Telephone Encounter (Signed)
PC to Levada Dy, Solicitor at [employer], informed her this patient has appointment tomorrow at 12:30 & needs transportation here,  she verbalizes understanding.

## 2022-03-08 ENCOUNTER — Inpatient Hospital Stay: Payer: Medicaid Other

## 2022-03-08 ENCOUNTER — Inpatient Hospital Stay: Payer: Medicaid Other | Attending: Oncology | Admitting: Oncology

## 2022-03-13 ENCOUNTER — Other Ambulatory Visit: Payer: Self-pay | Admitting: Urology

## 2022-03-13 NOTE — Progress Notes (Addendum)
For Short Stay: COVID SWAB appointment date: N/A Date of COVID positive in last 90 days: 03/15/22  Bowel Prep reminder:  N/A   For Anesthesia: PCP - Dr. Lenore Cordia Cardiologist - N/A  Chest x-ray - 9/26//23 in epic EKG - 01/24/22 in epic Stress Test -  N/A ECHO - 01/25/22 in epic Cardiac Cath -  N/A Pacemaker/ICD device last checked: N/A Pacemaker orders received: N/A Device Rep notified: N/A  Spinal Cord Stimulator: N/A  Sleep Study -  N/A CPAP -  N/A  Fasting Blood Sugar - 122-330 Checks Blood Sugar ___4__ times a day Date and result of last Hgb A1c-  Blood Thinner Instructions: N/A Aspirin Instructions:N/A Last Dose:N/A  Activity level: Unable to go up a flight of stairs without chest pain and/or shortness of breath     Anesthesia review: CHF, COPD, DM, CKDIII  Patient denies shortness of breath, fever, cough and chest pain at PAT appointment   Patient verbalized understanding of instructions that were given to them at the PAT appointment. Patient was also instructed that they will need to review over the PAT instructions again at home before surgery.

## 2022-03-15 DIAGNOSIS — Z8616 Personal history of COVID-19: Secondary | ICD-10-CM

## 2022-03-15 HISTORY — DX: Personal history of COVID-19: Z86.16

## 2022-03-19 ENCOUNTER — Encounter (HOSPITAL_COMMUNITY): Payer: Self-pay | Admitting: Urology

## 2022-03-19 NOTE — Patient Instructions (Addendum)
Preop instructions for:     Gregory [name] Date of Birth:  April 30, 1961                     Date of Procedure:  04/03/22  Procedure:   CYSTOSCOPY WITH BIOPSY AND OPTILUME   Surgeon: Dr. Jacalyn Lefevre Facility contact: Madelynn Done    Phone: [place]: RN contact name/phone#: April RN                      and Fax #: [phone]   Transportation contact phone#: [facility] [phone]    Time to arrive at [facility]: 915 AM   Report to: Admitting (On your left hand side)    Do not eat solid food past midnight the night before your procedure.(To include any tube feedings-must be discontinued)  May have the following until  8:30 AM day of procedure  CLEAR LIQUID DIET  Water Black Coffee (sugar ok, NO MILK/CREAM OR CREAMERS)  Tea (sugar ok, NO MILK/CREAM OR CREAMERS) regular and decaf                             Plain Jell-O (NO RED)                                           Fruit ices (not with fruit pulp, NO RED)                                     Popsicles (NO RED)                                                                  Juice: apple, WHITE grape, WHITE cranberry Sports drinks like Gatorade (NO RED)   Take these morning medications.(or give through gastrostomy or feeding tube).  Amlodipine   Fexofenadine Methenamine Oxybutynin Docusate Sodium Lyrica Metoprolol Ropinirole Gabapentin Oxycodone if able tolerate on an empty stomach  May use Spiriva and nasal spray morning of procedure  DO NOT TAKE TRULICITY ON WEDNESDAY March 28, 2022 (DO NOT TAKE 7 DAYS PRIOR TO SURGERY)   Monday April 02, 2022:  Take regular dose of Insulin Glargine Solostar in the morning      Take half (1/2) regular dose of Insulin Glargine Solostar in the evening      DO NOT GIVE BEDTIME DOSE OF HUMALOG   Note: No Insulin or Diabetic meds should be given or taken the morning of the procedure!  How to  Manage Your Diabetes Before and After Surgery  Why is it important to control my blood sugar before and after surgery? Improving blood sugar levels before and after surgery helps healing and can limit problems. A way of improving blood sugar control is eating a healthy diet by:  Eating less sugar and carbohydrates  Increasing activity/exercise  Talking with your doctor about reaching your blood sugar  goals High blood sugars (greater than 180 mg/dL) can raise your risk of infections and slow your recovery, so you will need to focus on controlling your diabetes during the weeks before surgery. Make sure that the doctor who takes care of your diabetes knows about your planned surgery including the date and location.  How do I manage my blood sugar before surgery? Check your blood sugar at least 4 times a day, starting 2 days before surgery, to make sure that the level is not too high or low. Check your blood sugar the morning of your surgery when you wake up and every 2 hours until you get to the Short Stay unit. If your blood sugar is less than 70 mg/dL, you will need to treat for low blood sugar: Do not take insulin. Treat a low blood sugar (less than 70 mg/dL) with  cup of clear juice (cranberry or apple), 4 glucose tablets, OR glucose gel. Recheck blood sugar in 15 minutes after treatment (to make sure it is greater than 70 mg/dL). If your blood sugar is not greater than 70 mg/dL on recheck, call [phone] for further instructions. Report your blood sugar to the short stay nurse when you get to Short Stay.  If you are admitted to the hospital after surgery: Your blood sugar will be checked by the staff and you will probably be given insulin after surgery (instead of oral diabetes medicines) to make sure you have good blood sugar levels. The goal for blood sugar control after surgery is 80-180 mg/dL.   WHAT DO I DO ABOUT MY DIABETES MEDICATION?  Do not take oral diabetes medicines  (pills) the morning of surgery.     DO NOT TAKE THE FOLLOWING 7 DAYS PRIOR TO SURGERY: Ozempic, Wegovy, Rybelsus (Semaglutide), Byetta (exenatide), Bydureon (exenatide ER), Victoza, Saxenda (liraglutide), or Trulicity (dulaglutide) Mounjaro (Tirzepatide) Adlyxin (Lixisenatide), Polyethylene Glycol Loxenatide.  If your CBG is greater than 220 mg/dL, you may take  of your sliding scale  (correction) dose of insulin the morning of surgery   Please send day of procedure:current med list and meds last taken that day, confirm nothing by mouth status from what time, Patient Demographic info( to include DNR status, problem list, allergies)   Bring Insurance card and picture ID Leave all jewelry and other valuables at place where living( no metal or rings to be worn) No contact lens  Men-no colognes,lotions   Any questions day of procedure,call  SHORT [phone]     Sent from :[facility]                   [phone]                   Fax:[phone]   Sent by : Alexsandria Kivett BSN, RN

## 2022-03-20 ENCOUNTER — Encounter (HOSPITAL_COMMUNITY): Payer: Self-pay | Admitting: Physician Assistant

## 2022-03-29 NOTE — H&P (Signed)
CC/HPI: cc: dysuria, OAB, BPH   01/28/20: 61 year old man who is a prior patient of Dr. 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 Patient was last seen by Fritz Pickerel in July. He was using tamsulosin at that time as well as 50 mg of Myrbetriq. Today he comes in with improvement in postvoid burning however it still present. He is being treated for UTI and is finishing up antibiotics. His urinalysis today is not concerning for infection. He denies any gross hematuria. Based on above symptoms and prior history of stricture we decided to proceed with cystoscopy today.   11/10/2020: Cystoscopy at last office visit showed a wide bore patent bulbar urethral stricture without evidence significant or recurrence obstruction. Reassurance provided against this. He was continued on tamsulosin. Patient was prescribed oxybutynin 15 mg ER in addition to his previous prescription for 50 mg Myrbetriq in an effort to better control his urinary urgency and UUI. He was scheduled to follow up in 3 months but has not been seen again until today where he presents for evaluation of worsening lower urinary tract symptoms.   12/15/20: 61 year old man with a history of bulbar urethral stricture last dilated in 2021 comes in with dysuria, urinary urgency and urge incontinence. He was started on Flomax at his last visit. He comes in without any improvement in symptoms. He feels like he is not fully emptying his bladder. PVR 74 cc. He is taking pyridium for dysuria.   01/13/21: 61 yo man with a history of bulbar urethral stricture last dilated in 2021 comes in with dysuria, urinary urgency and urge incontinence. He remains on flomax and myrbetriq. He continues to struggle with UTIs and dysuria. He is finishing course of antibiotics today however urine still looks infected.   02/02/21: 61 year old man with the above history comes in today for cystoscopy. He did start  Bactrim however urine still appears to be infected. In addition he states his symptoms have improved somewhat. He remains on Flomax, Myrbetriq. He is still experiences occasional dysuria and urgency but is better than his last visit in July.   11/15/2021: 61 year old man with a history of intermittent dysuria, overactive bladder and BPH with LUTS as well as bulbar urethral stricture here for follow-up. He was previously managed on tamsulosin 0.4 mg nightly, Myrbetriq 50 mg and methenamine. He had done well for a while however is now experiencing intermittent dysuria and increased urgency. On review of patient's medications it looks like he is no longer on the Myrbetriq or methenamine. He is on tamsulosin 0.4 mg nightly. His UA today does show bacteriuria. He was told he was treated for 2 different UTIs in the last several months.   01/12/2022: 61 year old man with the above history here for follow-up. He is currently on tamsulosin 0.8 mg nightly, Myrbetriq and methenamine. His urinalysis today looks improved from last time. He does use a lot of antibiotics for this chronic dysuria. He was started on Trulicity recently and blood sugars are reportedly better. He continues to have dysuria   03/09/2022: 61 year old man here for cystoscopy. He continues to have significant dysuria.     ALLERGIES: No Allergies    MEDICATIONS: Metoprolol Tartrate  Myrbetriq 50 mg tablet, extended release 24 hr 1 tablet PO Daily  Omeprazole 20 mg tablet, delayed release  Phenazopyridine Hcl 200 mg tablet 1 tablet PO TID PRN dysuria  Tamsulosin Hcl 0.4 mg capsule 2 capsule PO Q HS  Albuterol Sulfate Hfa  Amlodipine Besylate 10 mg tablet  Biofreeze 4 % gel  Bupropion Xl 300 mg tablet, extended release 24 hr  Claritin 10 mg capsule  Diclofenac Sodium 1 % gel  Docusate Sodium 100 mg tablet  Duloxetine Hcl 60 mg capsule,delayed release  Eszopiclone 3 mg tablet  Ferrous Sulfate 325 mg (65 mg iron) tablet  Furosemide 20 mg  tablet  Glipizide 10 mg tablet  Humulin N Kwikpen  Lantus Solostar  Lasix 40 mg tablet  Lidocaine Patch  Lyrica 150 mg capsule  Magnesium Oxide 400  Methenamine Hippurate 1 gram tablet 1 tablet PO Daily  Milk Of Magnesia  Mirabegron  Mylanta  Narcan Liquid  Nasonex Suspension  Nicotine Patch 7 mg/24 hour patch, transdermal 24 hours  Nicotine Patch 14 mg/24 hour patch, transdermal 24 hours  Nystatin Cream  Oxybutynin Chloride Er 15 mg tablet, extended release 24 hr  Oxycodone Hcl 30 mg tablet  Oxycodone Hcl 5 mg tablet  Ropinirole Hcl 0.25 mg tablet  Rosuvastatin Calcium 10 mg tablet  Senna-Docusate Sodium 8.6 mg-50 mg tablet  Spiriva Respimat 1.25 mcg/actuation mist inhaler  Tizanidine Hcl 2 mg capsule  Triamcinolone Acetonide 0.1 % cream  Vitamin B 12  Zofran     GU PSH: Cysto Dilate Stricture (M or F) - 2019 Cystoscopy - 2021, 2019       PSH Notes: No Surgical Problems   NON-GU PSH: No Non-GU PSH        GU PMH: BPH w/LUTS - 01/12/2022, - 11/15/2021, - 02/02/2021, - 01/13/2021, - 12/15/2020 (Stable), He does have BPH and is currently on maximum medical management. He has a slight elevation of his PVR which I am not sure is clinically significant., - 2019 Chronic cystitis (w/o hematuria) - 01/12/2022 Dysuria - 01/12/2022, - 11/15/2021, - 02/02/2021, - 01/13/2021, - 12/15/2020, - 11/10/2020 Urinary Frequency - 01/12/2022, - 11/15/2021, - 12/15/2020, - 11/10/2020, - 2021 Urinary Urgency - 11/15/2021, (Stable), - 2021, He now has urgency with urge incontinence. I have recommended he remain on his current medications and then I will add further medication as his problem is not a failure to empty but rather a failure to store., - 2019 Bulbar urethral stricture - 02/02/2021, I found a bulbar urethral stricture that is pretty significant and therefore have recommended balloon dilation. I will also perform a retrograde urethrogram to assess its length. We discussed the procedure in detail, its  probability of success, the risks and complications, the outpatient nature of the procedure as well as the anticipated postoperative course and the greatest risk being that of recurrence of his stricture., - 2019 Incomplete bladder emptying - 02/02/2021, - 11/10/2020, He is not emptying his bladder completely and this is at least impart likely due to his bulbar urethral stricture that I diagnosed today., - 2019 Urge incontinence - 02/02/2021, - 01/13/2021, - 12/15/2020, - 11/10/2020 (Stable), - 2021, I am going to have him 1st try Myrbetriq 25 mg for 2 weeks. If this is effective he will contact me otherwise he will try the 50 mg dose and then I want him to return in 4 weeks for reassessment and a check of his PVR., - 2019 Acute Cystitis/UTI - 01/13/2021, - 12/15/2020 Encounter for Prostate Cancer screening, He has not had a PSA done in some time. I will obtain a screening - 2019 BPH w/o LUTS, BPH (benign prostatic hypertrophy) - 2017 Urinary Retention, Acute urinary retention - 2017      PMH Notes: Urinary retention: In 2/17  he was found to be in urinary retention and with catheterization had a postobstructive diuresis indicating long-standing obstruction. He underwent an unsuccessful voiding trial during a hospitalization in 3/17 and in the office in 4/17. He indicated he had been on Flomax in the past and had experienced frequency, hesitancy and nocturia as well as intermittency with a slow stream.   Urodynamics 10/13/15: He was unable to generate a contraction and was therefore taught intermittent self-catheterization with recommendations to perform this 5 times per day in order to maintain a volume of less than 400 cc.    NON-GU PMH: Pyuria/other UA findings - 02/02/2021 Osteomyelitis, unspecified, Bone infection - 2017 Other chronic pain, Chronic pain - 2017 Personal history of other diseases of the circulatory system, History of hypertension - 2017 Personal history of other diseases of the  musculoskeletal system and connective tissue, History of osteomyelitis - 2017 Personal history of other diseases of the respiratory system, History of acute respiratory failure - 2017 Personal history of other endocrine, nutritional and metabolic disease, History of type 2 diabetes mellitus - 2017 Personal history of other mental and behavioral disorders, History of depression - 2017 Pneumonia due to Methicillin resistant Staphylococcus aureus, Methicillin resistant Staphylococcus aureus pneumonia - 2017 Encounter for general adult medical examination without abnormal findings, Encounter for preventive health examination    FAMILY HISTORY: Deceased - Runs In Family Prostate Cancer - Runs In Family   SOCIAL HISTORY: Marital Status: Single    REVIEW OF SYSTEMS:     GU Review Male:  Patient reports frequent urination and burning/ pain with urination. Patient denies hard to postpone urination, get up at night to urinate, leakage of urine, stream starts and stops, trouble starting your stream, have to strain to urinate , erection problems, and penile pain.    Gastrointestinal (Upper):  Patient denies nausea, vomiting, and indigestion/ heartburn.    Gastrointestinal (Lower):  Patient denies diarrhea and constipation.    Constitutional:  Patient denies fever, weight loss, fatigue, and night sweats.    Skin:  Patient denies skin rash/ lesion and itching.    Eyes:  Patient denies blurred vision and double vision.    Ears/ Nose/ Throat:  Patient denies sore throat and sinus problems.    Hematologic/Lymphatic:  Patient denies swollen glands and easy bruising.    Cardiovascular:  Patient denies leg swelling and chest pains.    Respiratory:  Patient denies cough and shortness of breath.    Endocrine:  Patient denies excessive thirst.    Musculoskeletal:  Patient denies back pain and joint pain.    Neurological:  Patient denies headaches and dizziness.    Psychologic:  Patient denies depression and  anxiety.    VITAL SIGNS: None     MULTI-SYSTEM PHYSICAL EXAMINATION:      Constitutional: Well-nourished. No physical deformities. Normally developed. Good grooming.     Neck: Neck symmetrical, not swollen. Normal tracheal position.     Respiratory: No labored breathing, no use of accessory muscles.      Skin: No paleness, no jaundice, no cyanosis. No lesion, no ulcer, no rash.     Neurologic / Psychiatric: Oriented to time, oriented to place, oriented to person. No depression, no anxiety, no agitation.     Eyes: Normal conjunctivae. Normal eyelids.     Ears, Nose, Mouth, and Throat: Left ear no scars, no lesions, no masses. Right ear no scars, no lesions, no masses. Nose no scars, no lesions, no masses. Normal hearing. Normal lips.  Musculoskeletal: Normal gait and station of head and neck.            Complexity of Data:   Records Review:  Previous Patient Records, POC Tool  Urine Test Review:  Urinalysis    04/17/18  PSA  Total PSA 0.57 ng/mL    PROCEDURES:    Flexible Cystoscopy - 52000  Risks, benefits, and some of the potential complications of the procedure were discussed at length with the patient including infection, bleeding, voiding discomfort, urinary retention, fever, chills, sepsis, and others. All questions were answered. Informed consent was obtained. Sterile technique and intraurethral analgesia were used.  Meatus:  Normal size. Normal location. Normal condition.  Urethra:  No strictures.  External Sphincter:  Normal.  Verumontanum:  Normal.  Prostate:  Obstructing lateral lobes. No intravesical median lobe. Small lesion on right distal prostatic urethral at level of veru.  Bladder Neck:  Non-obstructing.  Ureteral Orifices:  Normal location. Normal size. Normal shape. Effluxed clear urine.  Bladder:  No trabeculation. No tumors. Normal mucosa. No stones.      The lower urinary tract was carefully examined. The procedure was well-tolerated and without  complications. Antibiotic instructions were given. Instructions were given to call the office immediately for bloody urine, difficulty urinating, urinary retention, painful or frequent urination, fever, chills, nausea, vomiting or other illness. The patient stated that he understood these instructions and would comply with them.    Urinalysis w/Scope  Dipstick Dipstick Cont'd Micro  Color: Orange Bilirubin: Invalid mg/dL WBC/hpf: NS (Not Seen)  Appearance: Slightly Cloudy Ketones: Invalid mg/dL RBC/hpf: 0 - 2/hpf  Specific Gravity: Invalid Blood: Invalid ery/uL Bacteria: Few (10-25/hpf)  pH: Invalid Protein: Invalid mg/dL Cystals: Amorph Urates  Glucose: Invalid mg/dL Urobilinogen: Invalid mg/dL Casts: NS (Not Seen)   Nitrites: Invalid Trichomonas: Not Present   Leukocyte Esterase: Invalid leu/uL Mucous: Not Present    Epithelial Cells: 0 - 5/hpf    Yeast: NS (Not Seen)    Sperm: Not Present   Notes: Micro only due to color interference     ASSESSMENT:     ICD-10 Details  1 GU:  Dysuria - R30.0 Chronic, Stable  2  Chronic cystitis (w/o hematuria) - N30.20 Chronic, Stable  3  BPH w/LUTS - N40.1 Chronic, Stable   PLAN:   Document  Letter(s):  Created for Patient: Clinical Summary   Notes:  1. Chronic dysuria/BPH with LUTS: Discussed with patient proceeding with cystoscopy with biopsy of the small lesion in prostatic urethra as well as OptiLume dilation of prostatic urethra. Risks and benefits of the procedure discussed the patient detail including but not limited to pain, bleeding, infection, damage surrounding structures, recurrence, failure to help symptoms. Schedule next available surgery date.

## 2022-03-30 ENCOUNTER — Encounter (HOSPITAL_COMMUNITY): Payer: Self-pay | Admitting: Urology

## 2022-03-30 NOTE — Progress Notes (Signed)
I spoke with nurse Adelene Idler at [facility]. Patient currently has no symptoms of COVID at this time.

## 2022-04-03 ENCOUNTER — Ambulatory Visit (HOSPITAL_COMMUNITY): Admission: RE | Admit: 2022-04-03 | Payer: Medicaid Other | Source: Home / Self Care | Admitting: Urology

## 2022-04-03 ENCOUNTER — Encounter (HOSPITAL_COMMUNITY): Admission: RE | Payer: Self-pay | Source: Home / Self Care

## 2022-04-03 DIAGNOSIS — E1149 Type 2 diabetes mellitus with other diabetic neurological complication: Secondary | ICD-10-CM

## 2022-04-03 HISTORY — DX: Malignant melanoma of skin, unspecified: C43.9

## 2022-04-03 SURGERY — CYSTOSCOPY, WITH BIOPSY
Anesthesia: General

## 2022-04-09 ENCOUNTER — Telehealth: Payer: Self-pay

## 2022-04-09 ENCOUNTER — Telehealth: Payer: Self-pay | Admitting: *Deleted

## 2022-04-09 NOTE — Telephone Encounter (Signed)
   Pre-operative Risk Assessment    Patient Name: Gregory [name]  DOB: 09-14-1960 MRN: [id number]  PT IS GOING TO NEED A NEW PT APPT.    Request for Surgical Clearance    Procedure:   CYSTOSCOPY, Bx and OPTILUME  Date of Surgery:  Clearance TBD                                 Surgeon:  DR. Leonia Reader [facility] Group or Practice Name:  [facility] Phone number:  [phone] Fax number:  [phone]   Type of Clearance Requested:   - Medical ; NO MEDICATIONS LISTED AS NEEDING TO BE HELD   Type of Anesthesia:  General    Additional requests/questions:    Gregory [name]   04/09/2022, 12:44 PM

## 2022-04-09 NOTE — Telephone Encounter (Signed)
NOTES SCANNED TO REFERRAL 

## 2022-04-11 NOTE — Telephone Encounter (Signed)
Pt has new pt appt 05/03/22 with Dr. Johnsie Cancel for pre op clearance.

## 2022-04-20 NOTE — Progress Notes (Deleted)
CARDIOLOGY CONSULT NOTE       Patient ID: Gregory [name] MRN: [id number] DOB/AGE: 1960-12-31 61 y.o.  Admit date: (Not on file) Referring Physician: Jacalyn Lefevre Primary Physician: [name], MD Primary Cardiologist: New Reason for Consultation: Preoperative   Active Problems:   * No active hospital problems. *   HPI:  61 y.o. with multiple chronic urologic issues needing cystoscopy with biopsy and OptiLume dilatation of urethra by Dr Claudia Desanctis He's had urinary retention and infections  He is chronically ill smoker with COPD. He has DM. 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 Seen at [facility] 8/9-8/16 after vaping and using THC for neuropathic pain. Febrile to 104.5 CXR with pneumonia BC;s positive for strep CRF worsened to 2.5 then came back down to baseline 1.8  TTE done 01/25/22 reviewed  EF 55-60% mild LVH RV mildly enlarged No significant valve disease   No history of CAD, MI, syncope   ***  ROS All other systems reviewed and negative except as noted above  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)    Acquired absence of right leg below knee (HCC)    Acute osteomyelitis of calcaneum, right (HCC) 10/02/2016   Acute respiratory failure (HCC)    Amputation stump infection ([place]) 08/12/2018   Anemia    Basal cell carcinoma, eyelid    Cancer (HCC)    Candidiasis 08/12/2018   CHF (congestive heart failure) (HCC)    chronic diastolic    Chronic back pain    Chronic kidney disease    stage 3    Chronic multifocal osteomyelitis (HCC)    of ankle and foot    Chronic pain syndrome    COPD (chronic obstructive pulmonary disease) (HCC)    DDD (degenerative disc  disease), lumbar    Depression    major depressive disorder    Diabetes mellitus without complication ([place])    type 2    Diabetic ulcer of toe of left foot ([place]) 10/02/2016   Difficulty in walking, not elsewhere classified    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   Foot drop, bilateral    Foot drop, right 12/27/2015   GERD (gastroesophageal reflux disease)    Herpesviral vesicular dermatitis    History of COVID-19 03/15/2022   Hyperlipidemia    Hyperlipidemia    Hypertension    Insomnia    Malignant melanoma of other parts of face ([place])    Melanoma ([place])    MRSA bacteremia    Muscle weakness (generalized)    Nasal congestion    Necrosis of toe ([place]) 07/08/2018   Neuromuscular dysfunction of bladder    Osteomyelitis (HCC)    Osteomyelitis (HCC)    Osteomyelitis (HCC)    right BKA   Other idiopathic peripheral autonomic neuropathy    Retention of urine, unspecified    Squamous cell carcinoma of skin    Unsteadiness on feet    Urinary retention    Urinary retention    Urinary tract infection    Wears dentures    Wears glasses     Family History  Problem Relation Age of Onset   Diabetes Mother    Heart disease Mother  Cancer Mother    Cancer Father    Bone cancer Father    Prostate cancer Father    Cancer Paternal Grandfather    Cancer Other    Diabetes Other     Social History   Socioeconomic History   Marital status: Single    Spouse name: Not on file   Number of children: Not on file   Years of education: Not on file   Highest education level: Not on file  Occupational History   Not on file  Tobacco Use   Smoking status: Every Day    Packs/day: 0.50    Types: Cigarettes   Smokeless tobacco: Never   Tobacco comments:    11/19/18  Vaping Use   Vaping Use: Never used  Substance and Sexual Activity   Alcohol use: No   Drug use: No   Sexual activity: Not on file  Other Topics Concern   Not on file  Social History Narrative   Not on file   Social  Determinants of Health   Financial Resource Strain: Not on file  Food Insecurity: Not on file  Transportation Needs: Not on file  Physical Activity: Not on file  Stress: Not on file  Social Connections: Not on file  Intimate Partner Violence: Not on file    Past Surgical History:  Procedure Laterality Date   AMPUTATION Left 03/29/2017   Procedure: LEFT GREAT TOE AMPUTATION AT METATARSOPHALANGEAL JOINT;  Surgeon: [name], MD;  Location: [place];  Service: Orthopedics;  Laterality: Left;   AMPUTATION Right 03/29/2017   Procedure: RIGHT BELOW KNEE AMPUTATION;  Surgeon: [name], MD;  Location: [place];  Service: Orthopedics;  Laterality: Right;   AMPUTATION Left 06/06/2018   Procedure: LEFT 2ND TOE AMPUTATION;  Surgeon: [name], MD;  Location: [place];  Service: Orthopedics;  Laterality: Left;   AMPUTATION Right 11/19/2018   Procedure: AMPUTATION ABOVE KNEE;  Surgeon: [name], MD;  Location: [place];  Service: Orthopedics;  Laterality: Right;   ANTERIOR CERVICAL CORPECTOMY N/A 11/25/2015   Procedure: ANTERIOR CERVICAL FIVE CORPECTOMY Cervical four - six fusion;  Surgeon: Consuella Lose, MD;  Location: [place] [facility];  Service: Neurosurgery;  Laterality: N/A;  ANTERIOR CERVICAL FIVE CORPECTOMY Cervical four - six fusion   APPLICATION OF WOUND VAC Right 10/22/2018   Procedure: Application Of Wound Vac;  Surgeon: [name], MD;  Location: [place];  Service: Orthopedics;  Laterality: Right;   CYSTOSCOPY WITH RETROGRADE URETHROGRAM N/A 04/25/2018   Procedure: CYSTOSCOPY WITH RETROGRADE URETHROGRAM/ BALLOON DILATION;  Surgeon: Kathie Rhodes, MD;  Location: [facility];  Service: Urology;  Laterality: N/A;   MULTIPLE TOOTH EXTRACTIONS     POSTERIOR CERVICAL FUSION/FORAMINOTOMY N/A 11/29/2015   Procedure: Cervical Three-Cervical Seven Posterior Cervical Laminectomy with Fusion;  Surgeon: Consuella Lose, MD;  Location: [place] [facility];  Service: Neurosurgery;  Laterality: N/A;  Cervical  Three-Cervical Seven Posterior Cervical Laminectomy with Fusion   STUMP REVISION Right 10/22/2018   Procedure: REVISION RIGHT BELOW KNEE AMPUTATION;  Surgeon: [name], MD;  Location: [place];  Service: Orthopedics;  Laterality: Right;   STUMP REVISION Right 12/05/2018   Procedure: Revision Right Above Knee Amputation;  Surgeon: [name], MD;  Location: [place];  Service: Orthopedics;  Laterality: Right;   STUMP REVISION Right 05/29/2019   Procedure: REVISION RIGHT ABOVE KNEE AMPUTATION;  Surgeon: [name], MD;  Location: [place];  Service: Orthopedics;  Laterality: Right;   STUMP REVISION Right  06/24/2019   Procedure: STUMP REVISION;  Surgeon: [name], MD;  Location: [place];  Service: Orthopedics;  Laterality: Right;      Current Outpatient Medications:    amLODipine (NORVASC) 10 MG tablet, Take 10 mg by mouth daily., Disp: , Rfl:    azelastine (ASTELIN) 0.1 % nasal spray, Place 1 spray into both nostrils daily., Disp: , Rfl:    diclofenac Sodium (VOLTAREN) 1 % GEL, Apply 4 g topically every 8 (eight) hours as needed (for pain). On right leg, Disp: , Rfl:    docusate sodium (COLACE) 100 MG capsule, Take 1 capsule (100 mg total) by mouth 2 (two) times daily., Disp: 10 capsule, Rfl: 0   Eszopiclone 3 MG TABS, Take 3 mg by mouth at bedtime., Disp: , Rfl:    ferrous sulfate 325 (65 FE) MG tablet, Take 1 tablet (325 mg total) by mouth daily with breakfast., Disp: 30 tablet, Rfl: 0   fexofenadine (ALLEGRA) 60 MG tablet, Take 60 mg by mouth daily., Disp: , Rfl:    fluticasone (FLONASE) 50 MCG/ACT nasal spray, Place 1 spray into both nostrils daily., Disp: , Rfl:    furosemide (LASIX) 40 MG tablet, Take 40 mg by mouth daily., Disp: , Rfl:    gabapentin (NEURONTIN) 300 MG capsule, Take 300 mg by mouth 3 (three) times daily., Disp: , Rfl:    GLUCAGON HCL IJ, Inject 1 mg into the muscle once as needed (hypoglycemia)., Disp: , Rfl:    hydrOXYzine (ATARAX) 25 MG tablet, Take 25 mg by mouth  every 6 (six) hours as needed for itching., Disp: , Rfl:    Infant Care Products (DERMACLOUD) OINT, Apply 1 Application topically in the morning and at bedtime. To buttocks, Disp: , Rfl:    insulin glargine (LANTUS SOLOSTAR) 100 UNIT/ML Solostar Pen, Inject 12 Units into the skin daily. (Patient taking differently: Inject 12 Units into the skin 2 (two) times daily.), Disp: 15 mL, Rfl: 11   insulin lispro (HUMALOG) 100 UNIT/ML KwikPen, Inject 0-10 Units into the skin See admin instructions. Inject 0-10 units into the skin three times a day and at bedtime, PER SLIDING SCALE:  BGL 151-200 = 2 units 201-250 = 4 units 251-300 = 6 units 301-350 =  8 units 351-400 = 10 units, Disp: 15 mL, Rfl: 11   ipratropium-albuterol (DUONEB) 0.5-2.5 (3) MG/3ML SOLN, Take 3 mLs by nebulization every 6 (six) hours as needed (Shortness of breath)., Disp: , Rfl:    lactulose (CHRONULAC) 10 GM/15ML solution, Take 15 mLs (10 g total) by mouth 3 (three) times daily as needed for moderate constipation. (Patient taking differently: Take 10 g by mouth 3 (three) times daily.), Disp: 236 mL, Rfl: 0   levalbuterol (XOPENEX) 0.63 MG/3ML nebulizer solution, Take 3 mLs (0.63 mg total) by nebulization every 6 (six) hours as needed for wheezing or shortness of breath., Disp: 3 mL, Rfl: 12   Lidocaine 4 % PTCH, Apply 1 patch topically daily. Apply to right shoulder topically for once a day for pain 12 hrs off and 12 hrs on and remove per schedule, Disp: , Rfl:    magnesium oxide (MAG-OX) 400 MG tablet, Take 400 mg by mouth 2 (two) times daily., Disp: , Rfl:    melatonin 3 MG TABS tablet, Take 3 mg by mouth at bedtime., Disp: , Rfl:    Menthol, Topical Analgesic, (BIOFREEZE) 4 % GEL, Apply 1 Application topically every 8 (eight) hours as needed (for pain)., Disp: , Rfl:  Meth-Hyo-M Bl-Na Phos-Ph Sal (URIBEL) 118 MG CAPS, Take 1 capsule by mouth every 8 (eight) hours as needed (bladder pain)., Disp: , Rfl:    methenamine (MANDELAMINE) 1 g  tablet, Take 1 g by mouth daily., Disp: , Rfl:    metoprolol tartrate (LOPRESSOR) 25 MG tablet, Take 0.5 tablets (12.5 mg total) by mouth 2 (two) times daily., Disp: 30 tablet, Rfl: 0   Multiple Vitamins-Minerals (MULTIVITAMIN WITH MINERALS) tablet, Take 1 tablet by mouth daily., Disp: , Rfl:    naloxone (NARCAN) 4 MG/0.1ML LIQD nasal spray kit, Place 1 spray into the nose as needed (poorly responsive or turning blue)., Disp: , Rfl:    nystatin cream (MYCOSTATIN), Apply 1 Application topically See admin instructions. 1 application to gluteal fold every shift for wound care, Disp: , Rfl:    omeprazole (PRILOSEC) 20 MG capsule, Take 20 mg by mouth at bedtime., Disp: , Rfl:    ondansetron (ZOFRAN) 4 MG tablet, Take 4 mg by mouth every 8 (eight) hours as needed for nausea., Disp: , Rfl:    oxybutynin (DITROPAN-XL) 10 MG 24 hr tablet, Take 10 mg by mouth daily., Disp: , Rfl:    oxyCODONE (OXY IR/ROXICODONE) 5 MG immediate release tablet, Take 1 tablet (5 mg total) by mouth every 8 (eight) hours as needed for severe pain (pain). (Patient taking differently: Take 5 mg by mouth every 8 (eight) hours as needed (pain).), Disp: 3 tablet, Rfl: 0   oxycodone (ROXICODONE) 30 MG immediate release tablet, Take 1 tablet (30 mg total) by mouth every 8 (eight) hours., Disp: 5 tablet, Rfl: 0   OXYGEN, Inhale 2 L into the lungs as needed (for shortness of breath)., Disp: , Rfl:    phenazopyridine (PYRIDIUM) 100 MG tablet, Take 100 mg by mouth every 8 (eight) hours as needed (dysuria)., Disp: , Rfl:    Polyethyl Glycol-Propyl Glycol (LUBRICANT EYE DROPS) 0.4-0.3 % SOLN, Place 2 drops into both eyes every 4 (four) hours as needed (dryness/itching)., Disp: , Rfl:    polyethylene glycol (MIRALAX / GLYCOLAX) 17 g packet, Take 17 g by mouth daily., Disp: , Rfl:    pregabalin (LYRICA) 150 MG capsule, Take 1 capsule (150 mg total) by mouth 2 (two) times daily., Disp: 5 capsule, Rfl: 0   promethazine (PHENERGAN) 12.5 MG tablet,  Take 12.5 mg by mouth every 8 (eight) hours as needed for nausea or vomiting., Disp: , Rfl:    rOPINIRole (REQUIP) 2 MG tablet, Take 2 mg by mouth 2 (two) times daily., Disp: , Rfl:    rosuvastatin (CRESTOR) 10 MG tablet, Take 10 mg by mouth at bedtime., Disp: , Rfl:    senna-docusate (SENOKOT-S) 8.6-50 MG tablet, Take 1 tablet by mouth at bedtime., Disp: , Rfl:    SPIRIVA RESPIMAT 1.25 MCG/ACT AERS, Inhale 2 each into the lungs in the morning., Disp: , Rfl:    tamsulosin (FLOMAX) 0.4 MG CAPS capsule, Take 0.8 mg by mouth daily. 0900, Disp: , Rfl:    tiZANidine (ZANAFLEX) 4 MG tablet, Take 4 mg by mouth every 6 (six) hours as needed for muscle spasms., Disp: , Rfl:    triamcinolone cream (KENALOG) 0.1 %, Apply 1 application  topically See admin instructions. Apply to left lower leg 2 times a day, Disp: , Rfl:    TRULICITY 4.74 QV/9.5GL SOPN, Inject 0.75 mg into the skin every Wednesday., Disp: , Rfl:     Physical Exam: There were no vitals taken for this visit.    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  Invalid input(s): "LABALBU" Lab Results  Component Value Date   CKTOTAL 207 01/24/2022   TROPONINI 0.03 11/07/2015    Lab Results  Component Value Date   CHOL 92 11/17/2018   Lab Results  Component Value Date   HDL 19 (L) 11/17/2018   Lab Results  Component Value Date   LDLCALC 18 11/17/2018   Lab Results  Component Value Date   TRIG 143 07/11/2019   TRIG 273 (H) 11/17/2018   TRIG 227 (H) 11/21/2015    Lab Results  Component Value Date   CHOLHDL 4.8 11/17/2018   No results found for: "LDLDIRECT"    Radiology: No results found.  EKG: ST rate 109 PR 219 msec normal ST segments   ASSESSMENT AND PLAN:   Preoperative:  *** HTN:  continue current meds HLD on statin labs with primary PVD:  Right BKA, left forefoot amputation Chronic venous/lymph dx in LLE /fu Dr Duda/Dixon DM:  Discussed low carb diet.  Target hemoglobin A1c is 6.5 or less.  Continue current medications. CRF: baseline Cr 1.8 with anemia   ***  Signed: Jenkins Rouge 04/20/2022, 4:18 PM

## 2022-04-20 NOTE — Patient Instructions (Addendum)
Preop instructions for:     Gregory [name] Date of Birth:  [birth year]                     Date of Procedure:  05/01/22  Procedure:   CYSTOSCOPY WITH BIOPSY AND OPTILUME   Surgeon: Dr. Jacalyn Lefevre Facility contact: Madelynn Done    Phone: [place]: RN contact name/phone#: April RN                      and Fax #: [phone]   Transportation contact phone#: [facility] [phone]     Time to [facility] at [facility]: 715 AM   Report to: Admitting (On your left hand side)    Do not eat solid food or drink past midnight the night before your procedure.(To include any tube feedings-must be discontinued)   Take these morning medications.(or give through gastrostomy or feeding tube).  Amlodipine                 Fexofenadine Methenamine Oxybutynin Docusate Sodium Lyrica Metoprolol Ropinirole Gabapentin Oxycodone if able tolerate on an empty stomach   May use Spiriva and nasal spray morning of procedure   DO NOT TAKE TRULICITY ON WEDNESDAY, NOV. 8, 2023 (DO NOT TAKE 7 DAYS PRIOR TO SURGERY)   Monday April 02, 2022:   Take regular dose of Insulin Glargine Solostar in the morning                                                   Take half (1/2) regular dose of Insulin Glargine Solostar in the evening                                                   DO NOT GIVE BEDTIME DOSE OF HUMALOG   Note: No Insulin or Diabetic meds should be given or taken the morning of the procedure!   HOW TO MANAGE YOUR DIABETES BEFORE AND AFTER SURGERY   Why is it important to control my blood sugar before and after surgery? Improving blood sugar levels before and after surgery helps healing and can limit problems. A way of improving blood sugar control is eating a healthy diet by:  Eating less sugar and carbohydrates  Increasing activity/exercise  Talking with your doctor about reaching your blood sugar goals High  blood sugars (greater than 180 mg/dL) can raise your risk of infections and slow your recovery, so you will need to focus on controlling your diabetes during the weeks before surgery. Make sure that the doctor who takes care of your diabetes knows about your planned surgery including the date and location.   How do I manage my blood sugar before surgery? Check your blood sugar at least 4 times a day, starting 2 days before surgery, to make sure that the level is not too high or low. Check your blood sugar the morning of your surgery when you wake up and every 2 hours until you get to the Short Stay unit. If your blood sugar is less  than 70 mg/dL, you will need to treat for low blood sugar: Do not take insulin. Treat a low blood sugar (less than 70 mg/dL) with  cup of clear juice (cranberry or apple), 4 glucose tablets, OR glucose gel. Recheck blood sugar in 15 minutes after treatment (to make sure it is greater than 70 mg/dL). If your blood sugar is not greater than 70 mg/dL on recheck, call [phone] for further instructions. Report your blood sugar to the short stay nurse when you get to Short Stay.   If you are admitted to the hospital after surgery: Your blood sugar will be checked by the staff and you will probably be given insulin after surgery (instead of oral diabetes medicines) to make sure you have good blood sugar levels. The goal for blood sugar control after surgery is 80-180 mg/dL.     WHAT DO I DO ABOUT MY DIABETES MEDICATION?   Do not take oral diabetes medicines (pills) the morning of surgery.                                      DO NOT TAKE THE FOLLOWING 7 DAYS PRIOR TO SURGERY: Ozempic, Wegovy, Rybelsus (Semaglutide), Byetta (exenatide), Bydureon (exenatide ER), Victoza, Saxenda (liraglutide), or Trulicity (dulaglutide) Mounjaro (Tirzepatide) Adlyxin (Lixisenatide), Polyethylene Glycol Loxenatide.   If your CBG is greater than 220 mg/dL, you may take  of your sliding  scale  (correction) dose of insulin the morning of surgery   Please send day of procedure:current med list and meds last taken that day, confirm nothing by mouth status from what time, Patient Demographic info( to include DNR status, problem list, allergies)   Bring Insurance card and picture ID Leave all jewelry and other valuables at place where living( no metal or rings to be worn) No contact lens   Men-no colognes,lotions   Any questions day of procedure,call  SHORT STAY-[phone]     Sent from :[facility]                   Phone:[phone]                   Fax:[phone]   Sent by : Analeia Ismael BSN, RN

## 2022-04-30 NOTE — Progress Notes (Signed)
Anesthesia Chart Review   Case: [id number] Date/Time: 05/01/22 1015   Procedures:      CYSTOSCOPY WITH BIOPSY - 1 HR     CYSTOSCOPY WITH OPTILUME   Anesthesia type: General   Pre-op diagnosis: BENIGN PROSTATIC HYPERTROPHY, URETHRAL LESION   Location: [facility]   Surgeons: Robley Fries, MD       DISCUSSION:60 y.o. smoker with h/o HTN, COPD, h/o melanoma s/p radiation, CKD Stage III, DM II, osteomyelitis s/p right AKA, prior MRSA bacteremia, prior epidural abscess, BPH, urethral lesion scheduled for above procedure 05/01/2022 with Dr. Jacalyn Lefevre.   Echo 01/25/2022 with EF 55-60%, no valve problems.   Pt with recent admission 8/9-8/16/2023 with sepsis. Presented with high fever, elevated blood pressure, heart rate, leukocytosis, AKI, encephalopathy. Chest x-ray with ill-defined opacity in the left midlung and right lower lung.  Hospital follow up with PCP 03/07/2022. Per note pt denies shortness of breath, cough, chest pain, palpitations. Clearance from PCP on chart which states pt is clear to have surgery as scheduled.  Facility had initially initiated cardiac evaluation, unsure why from notes. Dr. Claudia Desanctis reports no cardiac clearance for upcoming procedure was requested.  Pt chronic diastolic heart failure, no recent Echo 01/25/22 with EF 55-60%, no valvular problems.  PCP states pt is stable and no cardiac evaluation needed prior to surgery.  Pt is same day workup, no seen in PAT.  Anesthesia to evaluate DOS.  VS: There were no vitals taken for this visit.  PROVIDERS: [name], MD is PCP    LABS: Labs reviewed: Acceptable for surgery. (all labs ordered are listed, but only abnormal results are displayed)  Labs Reviewed - No data to display   IMAGES:   EKG:   CV: Echo 01/25/2022 1. Left ventricular ejection fraction, by estimation, is 55 to 60%. The  left ventricle has normal function. The left ventricle has no regional  wall motion abnormalities.  There is mild left ventricular hypertrophy.  Left ventricular diastolic parameters  are consistent with Grade I diastolic dysfunction (impaired relaxation).   2. Right ventricular systolic function is normal. The right ventricular  size is mildly enlarged. Tricuspid regurgitation signal is inadequate for  assessing PA pressure.   3. The mitral valve is grossly normal. No evidence of mitral valve  regurgitation. No evidence of mitral stenosis.   4. The aortic valve was not well visualized. Aortic valve regurgitation  is not visualized. Aortic valve sclerosis is present, with no evidence of  aortic valve stenosis.   5. The inferior vena cava is dilated in size with <50% respiratory  variability, suggesting right atrial pressure of 15 mmHg.  Past Medical History:  Diagnosis Date   Acquired absence of left great toe ([place])    Acquired absence of right leg below knee (HCC)    Acute osteomyelitis of calcaneum, right ([place]) 10/02/2016   Acute respiratory failure (HCC)    Amputation stump infection ([place]) 08/12/2018   Anemia    Basal cell carcinoma, eyelid    Cancer (HCC)    Candidiasis 08/12/2018   CHF (congestive heart failure) (HCC)    chronic diastolic    Chronic back pain    Chronic kidney disease    stage 3    Chronic multifocal osteomyelitis (HCC)    of ankle and foot    Chronic pain syndrome    COPD (chronic obstructive pulmonary disease) (HCC)    DDD (degenerative disc disease), lumbar    Depression  major depressive disorder    Diabetes mellitus without complication ([place])    type 2    Diabetic ulcer of toe of left foot ([place]) 10/02/2016   Difficulty in walking, not elsewhere classified    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   Foot drop, bilateral    Foot drop, right 12/27/2015   GERD (gastroesophageal reflux disease)    Herpesviral vesicular dermatitis    History of COVID-19 03/15/2022   Hyperlipidemia    Hyperlipidemia    Hypertension    Insomnia    Malignant melanoma of  other parts of face ([place])    Melanoma ([place])    MRSA bacteremia    Muscle weakness (generalized)    Nasal congestion    Necrosis of toe ([place]) 07/08/2018   Neuromuscular dysfunction of bladder    Osteomyelitis (HCC)    Osteomyelitis (HCC)    Osteomyelitis (HCC)    right BKA   Other idiopathic peripheral autonomic neuropathy    Retention of urine, unspecified    Squamous cell carcinoma of skin    Unsteadiness on feet    Urinary retention    Urinary retention    Urinary tract infection    Wears dentures    Wears glasses     Past Surgical History:  Procedure Laterality Date   AMPUTATION Left 03/29/2017   Procedure: LEFT GREAT TOE AMPUTATION AT METATARSOPHALANGEAL JOINT;  Surgeon: [name], MD;  Location: [facility];  Service: Orthopedics;  Laterality: Left;   AMPUTATION Right 03/29/2017   Procedure: RIGHT BELOW KNEE AMPUTATION;  Surgeon: [name], MD;  Location: [facility];  Service: Orthopedics;  Laterality: Right;   AMPUTATION Left 06/06/2018   Procedure: LEFT 2ND TOE AMPUTATION;  Surgeon: [name], MD;  Location: [facility];  Service: Orthopedics;  Laterality: Left;   AMPUTATION Right 11/19/2018   Procedure: AMPUTATION ABOVE KNEE;  Surgeon: [name], MD;  Location: [place];  Service: Orthopedics;  Laterality: Right;   ANTERIOR CERVICAL CORPECTOMY N/A 11/25/2015   Procedure: ANTERIOR CERVICAL FIVE CORPECTOMY Cervical four - six fusion;  Surgeon: Consuella Lose, MD;  Location: [facility] [place] [facility];  Service: Neurosurgery;  Laterality: N/A;  ANTERIOR CERVICAL FIVE CORPECTOMY Cervical four - six fusion   APPLICATION OF WOUND VAC Right 10/22/2018   Procedure: Application Of Wound Vac;  Surgeon: [name], MD;  Location: [facility];  Service: Orthopedics;  Laterality: Right;   CYSTOSCOPY WITH RETROGRADE URETHROGRAM N/A 04/25/2018   Procedure: CYSTOSCOPY WITH RETROGRADE URETHROGRAM/ BALLOON DILATION;  Surgeon: Kathie Rhodes, MD;  Location: [facility];  Service: Urology;  Laterality: N/A;    MULTIPLE TOOTH EXTRACTIONS     POSTERIOR CERVICAL FUSION/FORAMINOTOMY N/A 11/29/2015   Procedure: Cervical Three-Cervical Seven Posterior Cervical Laminectomy with Fusion;  Surgeon: Consuella Lose, MD;  Location: [facility] [facility];  Service: Neurosurgery;  Laterality: N/A;  Cervical Three-Cervical Seven Posterior Cervical Laminectomy with Fusion   STUMP REVISION Right 10/22/2018   Procedure: REVISION RIGHT BELOW KNEE AMPUTATION;  Surgeon: [name], MD;  Location: [place];  Service: Orthopedics;  Laterality: Right;   STUMP REVISION Right 12/05/2018   Procedure: Revision Right Above Knee Amputation;  Surgeon: [name], MD;  Location: [facility];  Service: Orthopedics;  Laterality: Right;   STUMP REVISION Right 05/29/2019   Procedure: REVISION RIGHT ABOVE KNEE AMPUTATION;  Surgeon: [name], MD;  Location: [facility];  Service: Orthopedics;  Laterality: Right;   STUMP REVISION Right 06/24/2019   Procedure: STUMP REVISION;  Surgeon: [name], MD;  Location: [facility] [place];  Service: Orthopedics;  Laterality: Right;    MEDICATIONS: No current facility-administered medications for this encounter.    cetirizine (ZYRTEC) 10 MG tablet   cyanocobalamin 1000 MCG tablet   diclofenac Sodium (VOLTAREN) 1 % GEL   docusate sodium (COLACE) 100 MG capsule   eszopiclone (LUNESTA) 2 MG TABS tablet   ferrous sulfate 325 (65 FE) MG tablet   fluticasone (FLONASE) 50 MCG/ACT nasal spray   furosemide (LASIX) 20 MG tablet   gabapentin (NEURONTIN) 300 MG capsule   GLUCAGON HCL IJ   guaiFENesin 200 MG tablet   hydrOXYzine (ATARAX) 25 MG tablet   Infant Care Products (DERMACLOUD) OINT   insulin glargine (LANTUS SOLOSTAR) 100 UNIT/ML Solostar Pen   insulin lispro (HUMALOG) 100 UNIT/ML KwikPen   ipratropium-albuterol (DUONEB) 0.5-2.5 (3) MG/3ML SOLN   lactulose (CHRONULAC) 10 GM/15ML solution   levalbuterol (XOPENEX) 0.63 MG/3ML nebulizer solution   Lidocaine 4 % PTCH   magnesium oxide (MAG-OX) 400 MG tablet    melatonin 3 MG TABS tablet   Menthol, Topical Analgesic, (BIOFREEZE) 4 % GEL   Meth-Hyo-M Bl-Na Phos-Ph Sal (URIBEL) 118 MG CAPS   methenamine (MANDELAMINE) 1 g tablet   metoprolol tartrate (LOPRESSOR) 25 MG tablet   mirabegron ER (MYRBETRIQ) 50 MG TB24 tablet   Multiple Vitamins-Minerals (MULTIVITAMIN WITH MINERALS) tablet   naloxone (NARCAN) 4 MG/0.1ML LIQD nasal spray kit   nystatin cream (MYCOSTATIN)   omeprazole (PRILOSEC) 20 MG capsule   ondansetron (ZOFRAN) 4 MG tablet   oxybutynin (DITROPAN-XL) 10 MG 24 hr tablet   oxyCODONE (OXY IR/ROXICODONE) 5 MG immediate release tablet   oxycodone (ROXICODONE) 30 MG immediate release tablet   phenazopyridine (PYRIDIUM) 100 MG tablet   Polyethyl Glycol-Propyl Glycol (LUBRICANT EYE DROPS) 0.4-0.3 % SOLN   polyethylene glycol (MIRALAX / GLYCOLAX) 17 g packet   pregabalin (LYRICA) 150 MG capsule   promethazine (PHENERGAN) 12.5 MG tablet   rOPINIRole (REQUIP) 2 MG tablet   rosuvastatin (CRESTOR) 10 MG tablet   senna-docusate (SENOKOT-S) 8.6-50 MG tablet   SPIRIVA RESPIMAT 1.25 MCG/ACT AERS   tamsulosin (FLOMAX) 0.4 MG CAPS capsule   tiZANidine (ZANAFLEX) 4 MG tablet   triamcinolone cream (KENALOG) 0.1 %   OXYGEN   TRULICITY 2.18 CE/8.3DV SOPN    Samaiya Awadallah Ward, [name] [facility] [phone]

## 2022-05-01 ENCOUNTER — Encounter (HOSPITAL_COMMUNITY)
Admission: RE | Disposition: A | Discharge status: Nursing Home (Includes ICF) | Payer: Self-pay | Source: Skilled Nursing Facility | Attending: Internal Medicine

## 2022-05-01 ENCOUNTER — Ambulatory Visit (HOSPITAL_COMMUNITY): Payer: Medicaid Other

## 2022-05-01 ENCOUNTER — Inpatient Hospital Stay (HOSPITAL_COMMUNITY)
Admission: RE | Admit: 2022-05-01 | Discharge: 2022-05-04 | DRG: 686 | Disposition: A | Discharge status: Other Acute Inpatient Hospital | Payer: Medicaid Other | Source: Skilled Nursing Facility | Attending: Internal Medicine | Admitting: Internal Medicine

## 2022-05-01 ENCOUNTER — Ambulatory Visit (HOSPITAL_COMMUNITY): Payer: Medicaid Other | Admitting: Physician Assistant

## 2022-05-01 ENCOUNTER — Ambulatory Visit (HOSPITAL_BASED_OUTPATIENT_CLINIC_OR_DEPARTMENT_OTHER): Payer: Medicaid Other | Admitting: Physician Assistant

## 2022-05-01 ENCOUNTER — Encounter (HOSPITAL_COMMUNITY): Payer: Self-pay | Admitting: Urology

## 2022-05-01 DIAGNOSIS — F17210 Nicotine dependence, cigarettes, uncomplicated: Secondary | ICD-10-CM | POA: Diagnosis present

## 2022-05-01 DIAGNOSIS — J440 Chronic obstructive pulmonary disease with acute lower respiratory infection: Secondary | ICD-10-CM | POA: Diagnosis not present

## 2022-05-01 DIAGNOSIS — I10 Essential (primary) hypertension: Secondary | ICD-10-CM | POA: Diagnosis present

## 2022-05-01 DIAGNOSIS — Z85820 Personal history of malignant melanoma of skin: Secondary | ICD-10-CM

## 2022-05-01 DIAGNOSIS — E1140 Type 2 diabetes mellitus with diabetic neuropathy, unspecified: Secondary | ICD-10-CM | POA: Diagnosis present

## 2022-05-01 DIAGNOSIS — N401 Enlarged prostate with lower urinary tract symptoms: Secondary | ICD-10-CM | POA: Diagnosis present

## 2022-05-01 DIAGNOSIS — D413 Neoplasm of uncertain behavior of urethra: Principal | ICD-10-CM | POA: Diagnosis present

## 2022-05-01 DIAGNOSIS — G4700 Insomnia, unspecified: Secondary | ICD-10-CM | POA: Diagnosis present

## 2022-05-01 DIAGNOSIS — G2581 Restless legs syndrome: Secondary | ICD-10-CM | POA: Diagnosis present

## 2022-05-01 DIAGNOSIS — E1165 Type 2 diabetes mellitus with hyperglycemia: Secondary | ICD-10-CM | POA: Diagnosis present

## 2022-05-01 DIAGNOSIS — I130 Hypertensive heart and chronic kidney disease with heart failure and stage 1 through stage 4 chronic kidney disease, or unspecified chronic kidney disease: Secondary | ICD-10-CM | POA: Diagnosis present

## 2022-05-01 DIAGNOSIS — I110 Hypertensive heart disease with heart failure: Secondary | ICD-10-CM | POA: Diagnosis not present

## 2022-05-01 DIAGNOSIS — Z8616 Personal history of COVID-19: Secondary | ICD-10-CM

## 2022-05-01 DIAGNOSIS — N35912 Unspecified bulbous urethral stricture, male: Secondary | ICD-10-CM

## 2022-05-01 DIAGNOSIS — Z89422 Acquired absence of other left toe(s): Secondary | ICD-10-CM

## 2022-05-01 DIAGNOSIS — Z833 Family history of diabetes mellitus: Secondary | ICD-10-CM

## 2022-05-01 DIAGNOSIS — Z993 Dependence on wheelchair: Secondary | ICD-10-CM

## 2022-05-01 DIAGNOSIS — I509 Heart failure, unspecified: Secondary | ICD-10-CM

## 2022-05-01 DIAGNOSIS — Z79899 Other long term (current) drug therapy: Secondary | ICD-10-CM

## 2022-05-01 DIAGNOSIS — Z8249 Family history of ischemic heart disease and other diseases of the circulatory system: Secondary | ICD-10-CM

## 2022-05-01 DIAGNOSIS — G894 Chronic pain syndrome: Secondary | ICD-10-CM | POA: Diagnosis present

## 2022-05-01 DIAGNOSIS — E1142 Type 2 diabetes mellitus with diabetic polyneuropathy: Principal | ICD-10-CM

## 2022-05-01 DIAGNOSIS — Z87891 Personal history of nicotine dependence: Secondary | ICD-10-CM

## 2022-05-01 DIAGNOSIS — N183 Chronic kidney disease, stage 3 unspecified: Secondary | ICD-10-CM | POA: Diagnosis present

## 2022-05-01 DIAGNOSIS — Z923 Personal history of irradiation: Secondary | ICD-10-CM

## 2022-05-01 DIAGNOSIS — N3281 Overactive bladder: Secondary | ICD-10-CM | POA: Diagnosis present

## 2022-05-01 DIAGNOSIS — N1832 Chronic kidney disease, stage 3b: Secondary | ICD-10-CM | POA: Diagnosis present

## 2022-05-01 DIAGNOSIS — J189 Pneumonia, unspecified organism: Secondary | ICD-10-CM | POA: Diagnosis present

## 2022-05-01 DIAGNOSIS — I5032 Chronic diastolic (congestive) heart failure: Secondary | ICD-10-CM | POA: Diagnosis present

## 2022-05-01 DIAGNOSIS — N368 Other specified disorders of urethra: Secondary | ICD-10-CM

## 2022-05-01 DIAGNOSIS — E669 Obesity, unspecified: Secondary | ICD-10-CM | POA: Diagnosis present

## 2022-05-01 DIAGNOSIS — J9601 Acute respiratory failure with hypoxia: Secondary | ICD-10-CM | POA: Diagnosis present

## 2022-05-01 DIAGNOSIS — J449 Chronic obstructive pulmonary disease, unspecified: Secondary | ICD-10-CM | POA: Diagnosis present

## 2022-05-01 DIAGNOSIS — Z85828 Personal history of other malignant neoplasm of skin: Secondary | ICD-10-CM

## 2022-05-01 DIAGNOSIS — Z89511 Acquired absence of right leg below knee: Secondary | ICD-10-CM

## 2022-05-01 DIAGNOSIS — K5900 Constipation, unspecified: Secondary | ICD-10-CM | POA: Diagnosis present

## 2022-05-01 DIAGNOSIS — D631 Anemia in chronic kidney disease: Secondary | ICD-10-CM | POA: Diagnosis present

## 2022-05-01 DIAGNOSIS — F418 Other specified anxiety disorders: Secondary | ICD-10-CM

## 2022-05-01 DIAGNOSIS — E785 Hyperlipidemia, unspecified: Secondary | ICD-10-CM | POA: Diagnosis present

## 2022-05-01 DIAGNOSIS — E1122 Type 2 diabetes mellitus with diabetic chronic kidney disease: Secondary | ICD-10-CM | POA: Diagnosis present

## 2022-05-01 DIAGNOSIS — F419 Anxiety disorder, unspecified: Secondary | ICD-10-CM | POA: Diagnosis present

## 2022-05-01 DIAGNOSIS — Z981 Arthrodesis status: Secondary | ICD-10-CM

## 2022-05-01 DIAGNOSIS — N135 Crossing vessel and stricture of ureter without hydronephrosis: Secondary | ICD-10-CM | POA: Diagnosis present

## 2022-05-01 DIAGNOSIS — E119 Type 2 diabetes mellitus without complications: Secondary | ICD-10-CM

## 2022-05-01 DIAGNOSIS — N319 Neuromuscular dysfunction of bladder, unspecified: Secondary | ICD-10-CM | POA: Diagnosis present

## 2022-05-01 DIAGNOSIS — N400 Enlarged prostate without lower urinary tract symptoms: Secondary | ICD-10-CM | POA: Diagnosis present

## 2022-05-01 DIAGNOSIS — K219 Gastro-esophageal reflux disease without esophagitis: Secondary | ICD-10-CM | POA: Diagnosis present

## 2022-05-01 DIAGNOSIS — Z6833 Body mass index (BMI) 33.0-33.9, adult: Secondary | ICD-10-CM

## 2022-05-01 DIAGNOSIS — Z89412 Acquired absence of left great toe: Secondary | ICD-10-CM

## 2022-05-01 DIAGNOSIS — J441 Chronic obstructive pulmonary disease with (acute) exacerbation: Secondary | ICD-10-CM | POA: Diagnosis not present

## 2022-05-01 HISTORY — PX: CYSTOSCOPY WITH BIOPSY: SHX5122

## 2022-05-01 HISTORY — DX: Failed or difficult intubation, initial encounter: T88.4XXA

## 2022-05-01 HISTORY — PX: CYSTOSCOPY WITH URETHRAL DILATATION: SHX5125

## 2022-05-01 LAB — CBC WITH DIFFERENTIAL/PLATELET
Abs Immature Granulocytes: 0.05 10*3/uL (ref 0.00–0.07)
Basophils Absolute: 0.1 10*3/uL (ref 0.0–0.1)
Basophils Relative: 1 %
Eosinophils Absolute: 0.3 10*3/uL (ref 0.0–0.5)
Eosinophils Relative: 3 %
HCT: 35.1 % — ABNORMAL LOW (ref 39.0–52.0)
Hemoglobin: 10.7 g/dL — ABNORMAL LOW (ref 13.0–17.0)
Immature Granulocytes: 1 %
Lymphocytes Relative: 16 %
Lymphs Abs: 1.7 10*3/uL (ref 0.7–4.0)
MCH: 26.8 pg (ref 26.0–34.0)
MCHC: 30.5 g/dL (ref 30.0–36.0)
MCV: 88 fL (ref 80.0–100.0)
Monocytes Absolute: 0.7 10*3/uL (ref 0.1–1.0)
Monocytes Relative: 7 %
Neutro Abs: 7.9 10*3/uL — ABNORMAL HIGH (ref 1.7–7.7)
Neutrophils Relative %: 72 %
Platelets: 225 10*3/uL (ref 150–400)
RBC: 3.99 MIL/uL — ABNORMAL LOW (ref 4.22–5.81)
RDW: 15.5 % (ref 11.5–15.5)
WBC: 10.8 10*3/uL — ABNORMAL HIGH (ref 4.0–10.5)
nRBC: 0 % (ref 0.0–0.2)

## 2022-05-01 LAB — GLUCOSE, CAPILLARY
Glucose-Capillary: 165 mg/dL — ABNORMAL HIGH (ref 70–99)
Glucose-Capillary: 170 mg/dL — ABNORMAL HIGH (ref 70–99)
Glucose-Capillary: 176 mg/dL — ABNORMAL HIGH (ref 70–99)
Glucose-Capillary: 339 mg/dL — ABNORMAL HIGH (ref 70–99)

## 2022-05-01 LAB — BASIC METABOLIC PANEL
Anion gap: 10 (ref 5–15)
BUN: 37 mg/dL — ABNORMAL HIGH (ref 8–23)
CO2: 28 mmol/L (ref 22–32)
Calcium: 9 mg/dL (ref 8.9–10.3)
Chloride: 102 mmol/L (ref 98–111)
Creatinine, Ser: 2.07 mg/dL — ABNORMAL HIGH (ref 0.61–1.24)
GFR, Estimated: 36 mL/min — ABNORMAL LOW (ref >60)
Glucose, Bld: 173 mg/dL — ABNORMAL HIGH (ref 70–99)
Potassium: 4.2 mmol/L (ref 3.5–5.1)
Sodium: 140 mmol/L (ref 135–145)

## 2022-05-01 LAB — HEMOGLOBIN A1C
Hgb A1c MFr Bld: 6.9 % — ABNORMAL HIGH (ref 4.8–5.6)
Mean Plasma Glucose: 151.33 mg/dL

## 2022-05-01 SURGERY — CYSTOSCOPY, WITH BIOPSY
Anesthesia: General

## 2022-05-01 MED ORDER — FENTANYL CITRATE PF 50 MCG/ML IJ SOSY: 50.0000 ug | PREFILLED_SYRINGE | INTRAMUSCULAR | Status: AC

## 2022-05-01 MED ORDER — IOHEXOL 300 MG/ML  SOLN
INTRAMUSCULAR | Status: DC | PRN
  Administered 2022-05-01: 25 mL

## 2022-05-01 MED ORDER — SODIUM CHLORIDE 0.9% FLUSH
3.0000 mL | Freq: Two times a day (BID) | INTRAVENOUS | Status: DC
  Administered 2022-05-01 – 2022-05-04 (×6): 3 mL via INTRAVENOUS

## 2022-05-01 MED ORDER — TRIPLE ANTIBIOTIC 3.5-400-5000 EX OINT: 1.0000 | TOPICAL_OINTMENT | Freq: Three times a day (TID) | CUTANEOUS | Status: DC | PRN

## 2022-05-01 MED ORDER — TRIAMCINOLONE ACETONIDE 0.1 % EX CREA
1.0000 | TOPICAL_CREAM | Freq: Two times a day (BID) | CUTANEOUS | Status: DC
  Administered 2022-05-03 – 2022-05-04 (×3): 1 via TOPICAL
  Filled 2022-05-01 (×2): qty 15

## 2022-05-01 MED ORDER — MELATONIN 3 MG PO TABS
3.0000 mg | ORAL_TABLET | Freq: Every day | ORAL | Status: DC
  Administered 2022-05-01 – 2022-05-03 (×3): 3 mg via ORAL
  Filled 2022-05-01 (×4): qty 1

## 2022-05-01 MED ORDER — FENTANYL CITRATE (PF) 100 MCG/2ML IJ SOLN
INTRAMUSCULAR | Status: DC | PRN
  Administered 2022-05-01: 50 ug via INTRAVENOUS

## 2022-05-01 MED ORDER — ADULT MULTIVITAMIN W/MINERALS CH
1.0000 | ORAL_TABLET | Freq: Every day | ORAL | Status: DC
  Administered 2022-05-01 – 2022-05-04 (×4): 1 via ORAL
  Filled 2022-05-01 (×5): qty 1

## 2022-05-01 MED ORDER — FENTANYL CITRATE PF 50 MCG/ML IJ SOSY
PREFILLED_SYRINGE | INTRAMUSCULAR | Status: AC
  Administered 2022-05-01: 50 ug via INTRAVENOUS
  Filled 2022-05-01: qty 1

## 2022-05-01 MED ORDER — ONDANSETRON HCL 4 MG/2ML IJ SOLN: 4.0000 mg | INTRAMUSCULAR | Status: DC | PRN

## 2022-05-01 MED ORDER — CEFAZOLIN SODIUM-DEXTROSE 2-3 GM-%(50ML) IV SOLR
INTRAVENOUS | Status: DC | PRN
  Administered 2022-05-01: 2 g via INTRAVENOUS

## 2022-05-01 MED ORDER — DOCUSATE SODIUM 100 MG PO CAPS
100.0000 mg | ORAL_CAPSULE | Freq: Two times a day (BID) | ORAL | Status: DC
  Administered 2022-05-01 – 2022-05-04 (×5): 100 mg via ORAL
  Filled 2022-05-01 (×6): qty 1

## 2022-05-01 MED ORDER — LIDOCAINE 2% (20 MG/ML) 5 ML SYRINGE
INTRAMUSCULAR | Status: DC | PRN
  Administered 2022-05-01: 100 mg via INTRAVENOUS

## 2022-05-01 MED ORDER — ONDANSETRON HCL 4 MG/2ML IJ SOLN
INTRAMUSCULAR | Status: DC | PRN
  Administered 2022-05-01: 4 mg via INTRAVENOUS

## 2022-05-01 MED ORDER — TAMSULOSIN HCL 0.4 MG PO CAPS
0.8000 mg | ORAL_CAPSULE | Freq: Every day | ORAL | Status: DC
  Administered 2022-05-01 – 2022-05-04 (×4): 0.8 mg via ORAL
  Filled 2022-05-01 (×5): qty 2

## 2022-05-01 MED ORDER — LACTATED RINGERS IV SOLN: INTRAVENOUS | Status: DC

## 2022-05-01 MED ORDER — SUGAMMADEX SODIUM 500 MG/5ML IV SOLN
INTRAVENOUS | Status: AC
  Filled 2022-05-01: qty 5

## 2022-05-01 MED ORDER — MIDAZOLAM HCL 2 MG/2ML IJ SOLN
INTRAMUSCULAR | Status: DC | PRN
  Administered 2022-05-01: 1 mg via INTRAVENOUS

## 2022-05-01 MED ORDER — METOPROLOL TARTRATE 25 MG PO TABS
12.5000 mg | ORAL_TABLET | Freq: Two times a day (BID) | ORAL | Status: DC
  Administered 2022-05-01 – 2022-05-04 (×6): 12.5 mg via ORAL
  Filled 2022-05-01 (×7): qty 1

## 2022-05-01 MED ORDER — PROMETHAZINE HCL 25 MG PO TABS: 12.5000 mg | ORAL_TABLET | Freq: Four times a day (QID) | ORAL | Status: DC | PRN

## 2022-05-01 MED ORDER — DEXAMETHASONE SODIUM PHOSPHATE 10 MG/ML IJ SOLN
INTRAMUSCULAR | Status: DC | PRN
  Administered 2022-05-01: 10 mg via INTRAVENOUS

## 2022-05-01 MED ORDER — ONDANSETRON HCL 4 MG/2ML IJ SOLN
INTRAMUSCULAR | Status: AC
  Filled 2022-05-01: qty 2

## 2022-05-01 MED ORDER — URIBEL 118 MG PO CAPS: 1.0000 | ORAL_CAPSULE | Freq: Three times a day (TID) | ORAL | Status: DC | PRN

## 2022-05-01 MED ORDER — ROCURONIUM BROMIDE 10 MG/ML (PF) SYRINGE
PREFILLED_SYRINGE | INTRAVENOUS | Status: AC
  Filled 2022-05-01: qty 10

## 2022-05-01 MED ORDER — INSULIN ASPART 100 UNIT/ML IJ SOLN
0.0000 [IU] | Freq: Every day | INTRAMUSCULAR | Status: DC
  Administered 2022-05-01: 5 [IU] via SUBCUTANEOUS
  Administered 2022-05-02: 2 [IU] via SUBCUTANEOUS
  Administered 2022-05-03: 5 [IU] via SUBCUTANEOUS

## 2022-05-01 MED ORDER — UMECLIDINIUM BROMIDE 62.5 MCG/ACT IN AEPB
1.0000 | INHALATION_SPRAY | Freq: Every day | RESPIRATORY_TRACT | Status: DC
  Administered 2022-05-02 – 2022-05-04 (×3): 1 via RESPIRATORY_TRACT
  Filled 2022-05-01: qty 7

## 2022-05-01 MED ORDER — DICLOFENAC SODIUM 1 % EX GEL
4.0000 g | Freq: Three times a day (TID) | CUTANEOUS | Status: DC | PRN
  Filled 2022-05-01: qty 100

## 2022-05-01 MED ORDER — FENTANYL CITRATE PF 50 MCG/ML IJ SOSY
PREFILLED_SYRINGE | INTRAMUSCULAR | Status: AC
  Administered 2022-05-01: 50 ug via INTRAVENOUS
  Filled 2022-05-01: qty 2

## 2022-05-01 MED ORDER — PREGABALIN 75 MG PO CAPS
150.0000 mg | ORAL_CAPSULE | Freq: Two times a day (BID) | ORAL | Status: DC
  Administered 2022-05-01 – 2022-05-04 (×6): 150 mg via ORAL
  Filled 2022-05-01 (×6): qty 2

## 2022-05-01 MED ORDER — OXYCODONE HCL 5 MG PO TABS
ORAL_TABLET | ORAL | Status: AC
  Filled 2022-05-01: qty 6

## 2022-05-01 MED ORDER — GUAIFENESIN 200 MG PO TABS: 200.0000 mg | ORAL_TABLET | Freq: Four times a day (QID) | ORAL | Status: DC | PRN

## 2022-05-01 MED ORDER — OXYCODONE HCL 5 MG PO TABS
5.0000 mg | ORAL_TABLET | Freq: Three times a day (TID) | ORAL | Status: DC | PRN
  Administered 2022-05-01 – 2022-05-03 (×4): 5 mg via ORAL
  Filled 2022-05-01 (×4): qty 1

## 2022-05-01 MED ORDER — PROMETHAZINE HCL 25 MG/ML IJ SOLN: 6.2500 mg | INTRAMUSCULAR | Status: DC | PRN

## 2022-05-01 MED ORDER — FENTANYL CITRATE PF 50 MCG/ML IJ SOSY
PREFILLED_SYRINGE | INTRAMUSCULAR | Status: AC
  Filled 2022-05-01: qty 1

## 2022-05-01 MED ORDER — NICOTINE 7 MG/24HR TD PT24
7.0000 mg | MEDICATED_PATCH | Freq: Every day | TRANSDERMAL | Status: AC
  Administered 2022-05-01: 7 mg via TRANSDERMAL
  Filled 2022-05-01 (×2): qty 1

## 2022-05-01 MED ORDER — OXYBUTYNIN CHLORIDE ER 5 MG PO TB24
10.0000 mg | ORAL_TABLET | Freq: Every day | ORAL | Status: DC
  Administered 2022-05-02 – 2022-05-04 (×3): 10 mg via ORAL
  Filled 2022-05-01 (×3): qty 2
  Filled 2022-05-01: qty 1

## 2022-05-01 MED ORDER — NYSTATIN 100000 UNIT/GM EX CREA
1.0000 | TOPICAL_CREAM | Freq: Every day | CUTANEOUS | Status: DC
  Administered 2022-05-04: 1 via TOPICAL
  Filled 2022-05-01: qty 30

## 2022-05-01 MED ORDER — FUROSEMIDE 20 MG PO TABS
40.0000 mg | ORAL_TABLET | Freq: Every day | ORAL | Status: DC
  Administered 2022-05-02 – 2022-05-03 (×2): 40 mg via ORAL
  Filled 2022-05-01 (×2): qty 2

## 2022-05-01 MED ORDER — MAGNESIUM OXIDE -MG SUPPLEMENT 400 (240 MG) MG PO TABS
400.0000 mg | ORAL_TABLET | Freq: Two times a day (BID) | ORAL | Status: DC
  Administered 2022-05-01 – 2022-05-04 (×6): 400 mg via ORAL
  Filled 2022-05-01 (×7): qty 1

## 2022-05-01 MED ORDER — HYDROXYZINE HCL 25 MG PO TABS
25.0000 mg | ORAL_TABLET | Freq: Four times a day (QID) | ORAL | Status: DC | PRN
  Administered 2022-05-02 – 2022-05-03 (×5): 25 mg via ORAL
  Filled 2022-05-01 (×5): qty 1

## 2022-05-01 MED ORDER — GLUCAGON HCL 1 MG IJ SOLR: 1.0000 mg | Freq: Once | INTRAMUSCULAR | Status: DC | PRN

## 2022-05-01 MED ORDER — LIDOCAINE 5 % EX PTCH
2.0000 | MEDICATED_PATCH | CUTANEOUS | Status: DC
  Filled 2022-05-01 (×3): qty 2

## 2022-05-01 MED ORDER — MEPERIDINE HCL 50 MG/ML IJ SOLN: 6.2500 mg | INTRAMUSCULAR | Status: DC | PRN

## 2022-05-01 MED ORDER — SENNA 8.6 MG PO TABS
1.0000 | ORAL_TABLET | Freq: Two times a day (BID) | ORAL | Status: DC
  Administered 2022-05-01 – 2022-05-04 (×4): 8.6 mg via ORAL
  Filled 2022-05-01 (×6): qty 1

## 2022-05-01 MED ORDER — TIOTROPIUM BROMIDE MONOHYDRATE 1.25 MCG/ACT IN AERS: 2.0000 | INHALATION_SPRAY | Freq: Every morning | RESPIRATORY_TRACT | Status: DC

## 2022-05-01 MED ORDER — IPRATROPIUM-ALBUTEROL 0.5-2.5 (3) MG/3ML IN SOLN: 3.0000 mL | Freq: Four times a day (QID) | RESPIRATORY_TRACT | Status: DC | PRN

## 2022-05-01 MED ORDER — URELLE 81 MG PO TABS
1.0000 | ORAL_TABLET | Freq: Three times a day (TID) | ORAL | Status: DC | PRN
  Filled 2022-05-01: qty 1

## 2022-05-01 MED ORDER — ROSUVASTATIN CALCIUM 10 MG PO TABS
10.0000 mg | ORAL_TABLET | Freq: Every day | ORAL | Status: DC
  Administered 2022-05-01 – 2022-05-03 (×3): 10 mg via ORAL
  Filled 2022-05-01 (×3): qty 1

## 2022-05-01 MED ORDER — ACETAMINOPHEN 325 MG PO TABS: 650.0000 mg | ORAL_TABLET | ORAL | Status: DC | PRN

## 2022-05-01 MED ORDER — PROPOFOL 10 MG/ML IV BOLUS
INTRAVENOUS | Status: DC | PRN
  Administered 2022-05-01: 160 mg via INTRAVENOUS

## 2022-05-01 MED ORDER — NALOXONE HCL 4 MG/0.1ML NA LIQD: 1.0000 | NASAL | Status: DC | PRN

## 2022-05-01 MED ORDER — POLYVINYL ALCOHOL 1.4 % OP SOLN: 2.0000 [drp] | OPHTHALMIC | Status: DC | PRN

## 2022-05-01 MED ORDER — LEVALBUTEROL HCL 0.63 MG/3ML IN NEBU
0.6300 mg | INHALATION_SOLUTION | Freq: Four times a day (QID) | RESPIRATORY_TRACT | Status: DC | PRN
  Filled 2022-05-01: qty 3

## 2022-05-01 MED ORDER — PROPOFOL 10 MG/ML IV BOLUS
INTRAVENOUS | Status: AC
  Filled 2022-05-01: qty 20

## 2022-05-01 MED ORDER — SODIUM CHLORIDE 0.9 % IV SOLN
1.0000 g | INTRAVENOUS | Status: DC
  Administered 2022-05-02 – 2022-05-03 (×2): 1 g via INTRAVENOUS
  Filled 2022-05-01 (×2): qty 10

## 2022-05-01 MED ORDER — MIDAZOLAM HCL 2 MG/2ML IJ SOLN
INTRAMUSCULAR | Status: AC
  Filled 2022-05-01: qty 2

## 2022-05-01 MED ORDER — DEXAMETHASONE SODIUM PHOSPHATE 10 MG/ML IJ SOLN
INTRAMUSCULAR | Status: AC
  Filled 2022-05-01: qty 1

## 2022-05-01 MED ORDER — LIDOCAINE HCL (PF) 2 % IJ SOLN
INTRAMUSCULAR | Status: AC
  Filled 2022-05-01: qty 5

## 2022-05-01 MED ORDER — INSULIN ASPART 100 UNIT/ML IJ SOLN
0.0000 [IU] | Freq: Three times a day (TID) | INTRAMUSCULAR | Status: DC
  Administered 2022-05-02 (×2): 8 [IU] via SUBCUTANEOUS
  Administered 2022-05-02: 5 [IU] via SUBCUTANEOUS
  Administered 2022-05-03: 11 [IU] via SUBCUTANEOUS
  Administered 2022-05-03: 8 [IU] via SUBCUTANEOUS
  Administered 2022-05-03: 5 [IU] via SUBCUTANEOUS
  Administered 2022-05-04: 11 [IU] via SUBCUTANEOUS
  Administered 2022-05-04: 5 [IU] via SUBCUTANEOUS

## 2022-05-01 MED ORDER — DULAGLUTIDE 0.75 MG/0.5ML ~~LOC~~ SOAJ: 0.7500 mg | SUBCUTANEOUS | Status: DC

## 2022-05-01 MED ORDER — SUCCINYLCHOLINE CHLORIDE 200 MG/10ML IV SOSY
PREFILLED_SYRINGE | INTRAVENOUS | Status: DC | PRN
  Administered 2022-05-01: 120 mg via INTRAVENOUS

## 2022-05-01 MED ORDER — SODIUM CHLORIDE 0.9 % IR SOLN
Status: DC | PRN
  Administered 2022-05-01: 3000 mL via INTRAVESICAL

## 2022-05-01 MED ORDER — LACTULOSE 10 GM/15ML PO SOLN
10.0000 g | Freq: Three times a day (TID) | ORAL | Status: DC
  Administered 2022-05-01 – 2022-05-02 (×2): 10 g via ORAL
  Filled 2022-05-01 (×5): qty 15

## 2022-05-01 MED ORDER — ZOLPIDEM TARTRATE 5 MG PO TABS
5.0000 mg | ORAL_TABLET | Freq: Every evening | ORAL | Status: DC | PRN
  Administered 2022-05-01 – 2022-05-03 (×3): 5 mg via ORAL
  Filled 2022-05-01 (×3): qty 1

## 2022-05-01 MED ORDER — ACETAMINOPHEN 500 MG PO TABS
1000.0000 mg | ORAL_TABLET | Freq: Once | ORAL | Status: AC
  Administered 2022-05-01: 1000 mg via ORAL
  Filled 2022-05-01: qty 2

## 2022-05-01 MED ORDER — GLUCAGON HCL RDNA (DIAGNOSTIC) 1 MG IJ SOLR: 1.0000 mg | Freq: Once | INTRAMUSCULAR | Status: DC | PRN

## 2022-05-01 MED ORDER — POLYETHYL GLYCOL-PROPYL GLYCOL 0.4-0.3 % OP SOLN: 2.0000 [drp] | OPHTHALMIC | Status: DC | PRN

## 2022-05-01 MED ORDER — FLUTICASONE PROPIONATE 50 MCG/ACT NA SUSP
1.0000 | Freq: Every day | NASAL | Status: DC
  Administered 2022-05-02 – 2022-05-04 (×3): 1 via NASAL
  Filled 2022-05-01: qty 16

## 2022-05-01 MED ORDER — LORATADINE 10 MG PO TABS
10.0000 mg | ORAL_TABLET | Freq: Every day | ORAL | Status: DC
  Administered 2022-05-01 – 2022-05-04 (×4): 10 mg via ORAL
  Filled 2022-05-01 (×5): qty 1

## 2022-05-01 MED ORDER — FENTANYL CITRATE PF 50 MCG/ML IJ SOSY
25.0000 ug | PREFILLED_SYRINGE | INTRAMUSCULAR | Status: DC | PRN
  Administered 2022-05-01 (×2): 50 ug via INTRAVENOUS

## 2022-05-01 MED ORDER — SODIUM CHLORIDE 0.9 % IV SOLN: 250.0000 mL | INTRAVENOUS | Status: DC | PRN

## 2022-05-01 MED ORDER — ALBUTEROL SULFATE (2.5 MG/3ML) 0.083% IN NEBU
2.5000 mg | INHALATION_SOLUTION | Freq: Two times a day (BID) | RESPIRATORY_TRACT | Status: DC
  Administered 2022-05-02 – 2022-05-03 (×3): 2.5 mg via RESPIRATORY_TRACT
  Filled 2022-05-01 (×3): qty 3

## 2022-05-01 MED ORDER — MORPHINE SULFATE (PF) 2 MG/ML IV SOLN
2.0000 mg | INTRAVENOUS | Status: DC | PRN
  Administered 2022-05-01 – 2022-05-02 (×7): 4 mg via INTRAVENOUS
  Administered 2022-05-03 – 2022-05-04 (×5): 2 mg via INTRAVENOUS
  Filled 2022-05-01 (×4): qty 2
  Filled 2022-05-01 (×4): qty 1
  Filled 2022-05-01 (×2): qty 2
  Filled 2022-05-01: qty 1
  Filled 2022-05-01: qty 2

## 2022-05-01 MED ORDER — ONDANSETRON HCL 4 MG PO TABS: 4.0000 mg | ORAL_TABLET | Freq: Three times a day (TID) | ORAL | Status: DC | PRN

## 2022-05-01 MED ORDER — VITAMIN B-12 1000 MCG PO TABS
1000.0000 ug | ORAL_TABLET | Freq: Every day | ORAL | Status: DC
  Administered 2022-05-01 – 2022-05-04 (×4): 1000 ug via ORAL
  Filled 2022-05-01 (×5): qty 1

## 2022-05-01 MED ORDER — MENTHOL (TOPICAL ANALGESIC) 4 % EX GEL: 1.0000 | Freq: Three times a day (TID) | CUTANEOUS | Status: DC | PRN

## 2022-05-01 MED ORDER — SODIUM CHLORIDE 0.9% FLUSH: 3.0000 mL | INTRAVENOUS | Status: DC | PRN

## 2022-05-01 MED ORDER — IPRATROPIUM-ALBUTEROL 0.5-2.5 (3) MG/3ML IN SOLN
3.0000 mL | Freq: Four times a day (QID) | RESPIRATORY_TRACT | Status: DC
  Administered 2022-05-01: 3 mL via RESPIRATORY_TRACT
  Filled 2022-05-01: qty 3

## 2022-05-01 MED ORDER — STERILE WATER FOR IRRIGATION IR SOLN
Status: DC | PRN
  Administered 2022-05-01: 1000 mL

## 2022-05-01 MED ORDER — MUSCLE RUB 10-15 % EX CREA
TOPICAL_CREAM | Freq: Three times a day (TID) | CUTANEOUS | Status: DC | PRN
  Administered 2022-05-03 – 2022-05-04 (×2): 1 via TOPICAL
  Filled 2022-05-01: qty 85

## 2022-05-01 MED ORDER — PANTOPRAZOLE SODIUM 40 MG PO TBEC
40.0000 mg | DELAYED_RELEASE_TABLET | Freq: Every day | ORAL | Status: DC
  Administered 2022-05-01 – 2022-05-04 (×4): 40 mg via ORAL
  Filled 2022-05-01 (×5): qty 1

## 2022-05-01 MED ORDER — FERROUS SULFATE 325 (65 FE) MG PO TABS
325.0000 mg | ORAL_TABLET | Freq: Every day | ORAL | Status: DC
  Administered 2022-05-02 – 2022-05-04 (×3): 325 mg via ORAL
  Filled 2022-05-01 (×3): qty 1

## 2022-05-01 MED ORDER — TIZANIDINE HCL 4 MG PO TABS
4.0000 mg | ORAL_TABLET | Freq: Four times a day (QID) | ORAL | Status: DC | PRN
  Administered 2022-05-01 – 2022-05-04 (×7): 4 mg via ORAL
  Filled 2022-05-01 (×7): qty 1

## 2022-05-01 MED ORDER — CHLORHEXIDINE GLUCONATE CLOTH 2 % EX PADS
6.0000 | MEDICATED_PAD | Freq: Every day | CUTANEOUS | Status: DC
  Administered 2022-05-01 – 2022-05-04 (×4): 6 via TOPICAL

## 2022-05-01 MED ORDER — FUROSEMIDE 20 MG PO TABS
20.0000 mg | ORAL_TABLET | Freq: Every day | ORAL | Status: DC
  Administered 2022-05-02 – 2022-05-03 (×2): 20 mg via ORAL
  Filled 2022-05-01 (×2): qty 1

## 2022-05-01 MED ORDER — CEFAZOLIN SODIUM-DEXTROSE 2-4 GM/100ML-% IV SOLN
INTRAVENOUS | Status: AC
  Filled 2022-05-01: qty 100

## 2022-05-01 MED ORDER — INSULIN LISPRO (1 UNIT DIAL) 100 UNIT/ML (KWIKPEN): 0.0000 [IU] | PEN_INJECTOR | SUBCUTANEOUS | Status: DC

## 2022-05-01 MED ORDER — ROPINIROLE HCL 1 MG PO TABS
2.0000 mg | ORAL_TABLET | Freq: Two times a day (BID) | ORAL | Status: DC
  Administered 2022-05-01 – 2022-05-04 (×6): 2 mg via ORAL
  Filled 2022-05-01 (×7): qty 2

## 2022-05-01 MED ORDER — CHLORHEXIDINE GLUCONATE 0.12 % MT SOLN
15.0000 mL | Freq: Once | OROMUCOSAL | Status: AC
  Administered 2022-05-01: 15 mL via OROMUCOSAL

## 2022-05-01 MED ORDER — GABAPENTIN 300 MG PO CAPS
300.0000 mg | ORAL_CAPSULE | Freq: Three times a day (TID) | ORAL | Status: DC
  Administered 2022-05-01 – 2022-05-04 (×8): 300 mg via ORAL
  Filled 2022-05-01 (×8): qty 1

## 2022-05-01 MED ORDER — SODIUM CHLORIDE 0.9 % IV SOLN
500.0000 mg | INTRAVENOUS | Status: DC
  Administered 2022-05-01 – 2022-05-03 (×3): 500 mg via INTRAVENOUS
  Filled 2022-05-01 (×4): qty 5

## 2022-05-01 MED ORDER — FENTANYL CITRATE (PF) 100 MCG/2ML IJ SOLN
INTRAMUSCULAR | Status: AC
  Filled 2022-05-01: qty 2

## 2022-05-01 MED ORDER — GUAIFENESIN 100 MG/5ML PO LIQD
5.0000 mL | ORAL | Status: DC | PRN
  Administered 2022-05-03 – 2022-05-04 (×4): 5 mL via ORAL
  Filled 2022-05-01 (×4): qty 10

## 2022-05-01 MED ORDER — PHENAZOPYRIDINE HCL 100 MG PO TABS
100.0000 mg | ORAL_TABLET | Freq: Three times a day (TID) | ORAL | Status: DC | PRN
  Administered 2022-05-01: 100 mg via ORAL
  Filled 2022-05-01 (×2): qty 1

## 2022-05-01 MED ORDER — MIRABEGRON ER 25 MG PO TB24
50.0000 mg | ORAL_TABLET | Freq: Every day | ORAL | Status: DC
  Administered 2022-05-01 – 2022-05-04 (×4): 50 mg via ORAL
  Filled 2022-05-01 (×4): qty 2

## 2022-05-01 MED ORDER — OXYCODONE HCL 5 MG PO TABS
30.0000 mg | ORAL_TABLET | Freq: Three times a day (TID) | ORAL | Status: DC
  Administered 2022-05-01 – 2022-05-04 (×9): 30 mg via ORAL
  Filled 2022-05-01 (×8): qty 6

## 2022-05-01 MED ORDER — LIDOCAINE 4 % EX PTCH: 2.0000 | MEDICATED_PATCH | CUTANEOUS | Status: DC

## 2022-05-01 SURGICAL SUPPLY — 33 items
BAG DRN RND TRDRP ANRFLXCHMBR (UROLOGICAL SUPPLIES) ×1
BAG URINE DRAIN 2000ML AR STRL (UROLOGICAL SUPPLIES) ×1 IMPLANT
BAG URO CATCHER STRL LF (MISCELLANEOUS) ×1 IMPLANT
BALLN NEPHROSTOMY (BALLOONS)
BALLN OPTILUME DCB 30X5X75 (BALLOONS) ×1
BALLOON NEPHROSTOMY (BALLOONS) IMPLANT
BALLOON OPTILUME DCB 30X5X75 (BALLOONS) IMPLANT
CATH FOLEY 2W COUNCIL 20FR 5CC (CATHETERS) IMPLANT
CATH FOLEY 2W COUNCIL 5CC 16FR (CATHETERS) IMPLANT
CATH ROBINSON RED A/P 14FR (CATHETERS) IMPLANT
CATH URET 5FR 28IN CONE TIP (BALLOONS)
CATH URET 5FR 70CM CONE TIP (BALLOONS) IMPLANT
CATH URETL OPEN END 6FR 70 (CATHETERS) IMPLANT
CLOTH BEACON ORANGE TIMEOUT ST (SAFETY) ×1 IMPLANT
DRAPE FOOT SWITCH (DRAPES) ×1 IMPLANT
ELECT REM PT RETURN 15FT ADLT (MISCELLANEOUS) ×1 IMPLANT
GLOVE BIO SURGEON STRL SZ 6.5 (GLOVE) ×1 IMPLANT
GLOVE SURG LX STRL 7.5 STRW (GLOVE) ×1 IMPLANT
GOWN STRL REUS W/ TWL LRG LVL3 (GOWN DISPOSABLE) ×1 IMPLANT
GOWN STRL REUS W/TWL LRG LVL3 (GOWN DISPOSABLE) ×1
GUIDEWIRE ANG ZIPWIRE 038X150 (WIRE) IMPLANT
GUIDEWIRE STR DUAL SENSOR (WIRE) IMPLANT
KIT TURNOVER KIT A (KITS) IMPLANT
LOOP CUT BIPOLAR 24F LRG (ELECTROSURGICAL) IMPLANT
MANIFOLD NEPTUNE II (INSTRUMENTS) ×1 IMPLANT
NS IRRIG 1000ML POUR BTL (IV SOLUTION) IMPLANT
PACK CYSTO (CUSTOM PROCEDURE TRAY) ×1 IMPLANT
SYR TOOMEY IRRIG 70ML (MISCELLANEOUS) ×1
SYRINGE TOOMEY IRRIG 70ML (MISCELLANEOUS) IMPLANT
TUBE SUCTION HIGH CAP CLEAR NV (SUCTIONS) IMPLANT
TUBING CONNECTING 10 (TUBING) ×1 IMPLANT
TUBING UROLOGY SET (TUBING) IMPLANT
WATER STERILE IRR 3000ML UROMA (IV SOLUTION) ×1 IMPLANT

## 2022-05-01 NOTE — Progress Notes (Signed)
Patient and patients daughter were updated that the OR is behind and possible time of surgery is now 66.  Patient voiced understanding but visibly frustrated.  Patient c/o pain, has chronic pain and is used to getting oxycodone around the clock.  Informed Dr. Lissa Hoard and he ordered fentanyl 46mg and may repeat dosex1.

## 2022-05-01 NOTE — Transfer of Care (Signed)
Immediate Anesthesia Transfer of Care Note  Patient: Gregory [name]  Procedure(s) Performed: CYSTOSCOPY WITH URETHRAL BIOPSY BALLOON DILATION WITH  OPTILUME  Patient Location: PACU  Anesthesia Type:General  Level of Consciousness: awake  Airway & Oxygen Therapy: Patient Spontanous Breathing and Patient connected to face mask oxygen  Post-op Assessment: Report given to RN and Post -op Vital signs reviewed and stable  Post vital signs: Reviewed and stable  Last Vitals:  Vitals Value Taken Time  BP 138/74 05/01/22 1228  Temp    Pulse 76 05/01/22 1230  Resp 22 05/01/22 1230  SpO2 94 % 05/01/22 1230  Vitals shown include unvalidated device data.  Last Pain:  Vitals:   05/01/22 1057  TempSrc:   PainSc: 9       Patients Stated Pain Goal: 8 (48/27/07 8675)  Complications:  Encounter Notable Events  Notable Event Outcome Phase Comment  Difficult to intubate - expected  Intraprocedure Filed from anesthesia note documentation.

## 2022-05-01 NOTE — Interval H&P Note (Signed)
History and Physical Interval Note:  05/01/2022 9:58 AM  Gregory [name]  has presented today for surgery, with the diagnosis of BENIGN PROSTATIC HYPERTROPHY, URETHRAL LESION.  The various methods of treatment have been discussed with the patient and family. After consideration of risks, benefits and other options for treatment, the patient has consented to  Procedure(s) with comments: CYSTOSCOPY WITH BIOPSY (N/A) - 1 HR CYSTOSCOPY WITH OPTILUME (N/A) as a surgical intervention.  The patient's history has been reviewed, patient examined, no change in status, stable for surgery.  I have reviewed the patient's chart and labs.  Questions were answered to the patient's satisfaction.     Salman Wellen D Emmamarie Kluender

## 2022-05-01 NOTE — Anesthesia Postprocedure Evaluation (Signed)
Anesthesia Post Note  Patient: Gregory [name]  Procedure(s) Performed: CYSTOSCOPY WITH URETHRAL BIOPSY BALLOON DILATION WITH  OPTILUME     Patient location during evaluation: PACU Anesthesia Type: General Level of consciousness: patient cooperative and awake and alert Pain management: pain level controlled Vital Signs Assessment: post-procedure vital signs reviewed and stable Respiratory status: spontaneous breathing and patient connected to nasal cannula oxygen Cardiovascular status: stable Anesthetic complications: yes Comments: SpO2 noted to be in high 80's on RA prior to induction.  Significant thick yellow sputum noted in endotracheal tube prior to extubation. Pt requiring supplemental O2 to keep SpO2 in 90's. CXR obtained and [facility] notified. Hospitalist consulted by [facility]. CXR showed some possible consolidation. Abx and admission by hositalist service.   Encounter Notable Events  Notable Event Outcome Phase Comment  Difficult to intubate - expected  Intraprocedure Filed from anesthesia note documentation.    Last Vitals:  Vitals:   05/01/22 2010 05/01/22 2030  BP: (!) 161/80   Pulse: 90   Resp: 20   Temp: (!) 36.4 C   SpO2: 100% 95%    Last Pain:  Vitals:   05/01/22 2018  TempSrc:   PainSc: 10-Worst pain ever                 Nolon Nations

## 2022-05-01 NOTE — Progress Notes (Signed)
Called for medicine for consideration for admission due to increased O2 requirement following urethral dilation in OR.  Medicine evaluating for pneumonia and other chronic issues.  No current Urologic issues. Consulting physician asked Urology to admit with plan to transfer patient in AM if unable to be weaned and discharged.

## 2022-05-01 NOTE — Op Note (Addendum)
Operative Note  Preoperative diagnosis:  1.  Dysuria 2.  Prostatic urethral lesion 3.  Bulbar urethral stricture  Postoperative diagnosis: 1.  Dysuria 2.  Prostatic urethral lesion 3.  Bulbar urethral stricture  Procedure(s): 1.  Diagnostic cystoscopy 2.  Prostatic urethral biopsy 3.  Urethral dilation with Optilume  Surgeon: Jacalyn Lefevre, MD  Assistants:  None  Anesthesia:  General  Complications:  None  EBL:  minimal  Specimens: 1. Prostatic urethral biospy  Drains/Catheters: 1.  16Fr council tip foley  Intraoperative findings:   Wide bore bulbar urethral stricture that can traversed by cystoscope with gentle pressure Small papillary prostatic urethral lesion on right lateral and just proximal to verumontanum  Normal bladder mucosa without masses or stones Bilateral orthotopic Uos Prostatic lateral lobe hyperplasia  Indication:  Gregory [name] is a 61 y.o. male with with long standing intermittent dysuria, overactive bladder and BPH as well as urethral stricture.  Diagnostic cystoscopy recently saw a prostatic urethral lesion.  Patient is currently using tamsulosin, Myrbetriq and methenamine.  Description of procedure:  After risks and benefits of the procedure discussed with the patient, informed consent was obtained.  The patient taken the operating placed in supine position.  Anesthesia was induced and antibiotics were administered.  Patient was then repositioned in the dorsolithotomy position.  He was prepped and draped in usual fashion and timeout is performed.  47 French rigid cystoscope was placed in the urethral meatus and advanced in the bladder and direct visualization.  There was resistance met in the bulbar urethra which was traversable with pressure.  Again a small subcentimeter papillary lesion was seen in the right prostatic urethral wall just proximal to the verumontanum.  Diagnostic cystoscopy did not show any abnormal bladder mucosa or stones.  He had  bilateral orthotopic ureteral orifices.  A cold cup bladder biopsy was used to resect the lesion in the prostatic urethra.  The cystoscope was then removed and the resectoscope was assembled.  This was placed in the urethral meatus and advanced in the bladder using the visual obturator.  The visual obturator was removed and the resectoscope including the bipolar loop was then assembled.  The bipolar loop was used to cauteriz the biopsy site.  There was also some sloughing tissue on the left-hand side and the membranous urethra that was gently cauterized.  A 0.38 wire was then placed through the sheath of the resectoscope and the resectoscope was removed.  Next OptiLume urethral balloon dilator was placed over the wire and advanced to span the prostatic urethra and bulbar urethra.  It was inflated to 10 mmHg and kept in place for 3 minutes.  The balloon was then deflated and it was removed and care to keep the wire in place.  Next a 35 [place] council tip Foley was placed over the wire and advanced to the bladder.  Once it was hubbed and drainage was seen it was inflated with 10 cc sterile water.  The wire was removed.  The patient emerged from anesthesia and transferred the PACU in stable condition.  Plan: Follow-up in 1 week with void trial

## 2022-05-01 NOTE — Anesthesia Preprocedure Evaluation (Addendum)
Anesthesia Evaluation  Patient identified by MRN, date of birth, ID band Patient awake    Reviewed: Allergy & Precautions, NPO status , Patient's Chart, lab work & pertinent test results, reviewed documented beta blocker date and time   Airway Mallampati: III  TM Distance: >3 FB Neck ROM: Limited    Dental  (+) Edentulous Upper, Edentulous Lower, Dental Advisory Given   Pulmonary pneumonia, COPD, Current Smoker and Patient abstained from smoking.   Pulmonary exam normal breath sounds clear to auscultation       Cardiovascular hypertension, Pt. on home beta blockers and Pt. on medications +CHF   Rhythm:Regular Rate:Normal  Echo 11/20/15: Study Conclusions - Left ventricle: The cavity size was normal. There was mild concentric hypertrophy. Systolic function was normal. Theestimated ejection fraction was in the range of 55% to 60%. Doppler parameters are consistent with abnormal left ventricular relaxation (grade 1 diastolic dysfunction). - Pericardium, extracardiac: A small pericardial effusion was identified anterior to the heart. There was no evidence of hemodynamic compromise.  Carotid US 04/11/12: Summary: - No significant extracranial carotid artery stenosis demonstrated. Right vertebral could not be visualized due to thickness of the neck and respiratory interference. Left vertebral artery flow is antegrade - Technically difficult due to thickness of the neck, high bifurcations, and respiratory interference.   Neuro/Psych  PSYCHIATRIC DISORDERS Anxiety Depression     Neuromuscular disease    GI/Hepatic Neg liver ROS,GERD  ,,  Endo/Other  diabetes, Type 2    Renal/GU Renal InsufficiencyRenal disease     Musculoskeletal  (+) Arthritis ,    Abdominal  (+) + obese  Peds  Hematology  (+) Blood dyscrasia (Hgb 7.7), anemia   Anesthesia Other Findings R AKA stump infection  Reproductive/Obstetrics                              Anesthesia Physical Anesthesia Plan  ASA: 3  Anesthesia Plan: General   Post-op Pain Management: Minimal or no pain anticipated   Induction: Intravenous  PONV Risk Score and Plan: 2 and Ondansetron, Midazolam, Dexamethasone and Treatment may vary due to age or medical condition  Airway Management Planned: Oral ETT  Additional Equipment: None  Intra-op Plan:   Post-operative Plan: Extubation in OR  Informed Consent: I have reviewed the patients History and Physical, chart, labs and discussed the procedure including the risks, benefits and alternatives for the proposed anesthesia with the patient or authorized representative who has indicated his/her understanding and acceptance.     Dental advisory given  Plan Discussed with: CRNA  Anesthesia Plan Comments:         Anesthesia Quick Evaluation

## 2022-05-01 NOTE — Consult Note (Signed)
Initial Consultation Note   Patient: Gregory [name] [id number]:[id number] DOB: [birth year] PCP: [name], MD DOA: 05/01/2022 DOS: the patient was seen and examined on 05/01/2022 Primary service: Robley Fries, MD  Referring physician: Dr. Claudia Desanctis Reason for consult: Hypoxia  Assessment and Plan: RUL PNA Acute hypoxic respiratory failure     - admitted to urology service     - CXR w/ RUL PNA     - CAP coverage, nebs, guaifenesin     - IS, FV     - wean O2 as able  COPD     - will have duonebs tonight, can resume home regimen in AM  DM2     - SSI, A1c, glucose checks, DM diet  CKD3b     - at baseline, watch nephrotoxins  Chronic diastolic HF HTN     - continue home regimen when confirmed     - checking BNP, but doesn't appear to be in exacerbation  HLD     - continue home regimen when confirmed  BPH     - per primary team  Remainder per primary team  [facility] will continue to follow the patient.  HPI: Gregory [name] is a 61 y.o. male with past medical history of DM2, HTN, HFpEF, COPD, HLD. Presenting with urethral stricture. He was taken to the OR today for a diagnostic cystoscopy, prostatic urethral biopsy, and urethral dilation. Post procedure, he was hypoxic and required supplemental O2 up to 6L. [facility] was called for consult on his hypoxia.  He reports that at baseline he does not use supplemental O2. He reports that over the last couple of days he has had increased cough and sputum production. He has not had any fevers. He is not aware of any sick contacts. He has remained compliant on his home regimen for CHF and COPD as it is administered through his SNF. He otherwise denies any aggravating or alleviating factors.   Review of Systems: As mentioned in the history of present illness. All other systems reviewed and are negative. Past Medical History:  Diagnosis Date   Acquired absence of left great toe ([place])    Acquired absence of right leg below knee (HCC)    Acute  osteomyelitis of calcaneum, right ([place]) 10/02/2016   Acute respiratory failure (HCC)    Amputation stump infection ([place]) 08/12/2018   Anemia    Basal cell carcinoma, eyelid    Cancer (HCC)    Candidiasis 08/12/2018   CHF (congestive heart failure) (HCC)    chronic diastolic    Chronic back pain    Chronic kidney disease    stage 3    Chronic multifocal osteomyelitis (HCC)    of ankle and foot    Chronic pain syndrome    COPD (chronic obstructive pulmonary disease) (HCC)    DDD (degenerative disc disease), lumbar    Depression    major depressive disorder    Diabetes mellitus without complication ([place])    type 2    Diabetic ulcer of toe of left foot ([place]) 10/02/2016   Difficult intubation    Difficulty in walking, not elsewhere classified    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   Foot drop, bilateral    Foot drop, right 12/27/2015   GERD (gastroesophageal reflux disease)    Herpesviral vesicular dermatitis    History of COVID-19 03/15/2022   Hyperlipidemia    Hyperlipidemia    Hypertension    Insomnia    Malignant melanoma of other parts of face ([place])  Melanoma ([place])    MRSA bacteremia    Muscle weakness (generalized)    Nasal congestion    Necrosis of toe ([place]) 07/08/2018   Neuromuscular dysfunction of bladder    Osteomyelitis (HCC)    Osteomyelitis (HCC)    Osteomyelitis (HCC)    right BKA   Other idiopathic peripheral autonomic neuropathy    Retention of urine, unspecified    Squamous cell carcinoma of skin    Unsteadiness on feet    Urinary retention    Urinary retention    Urinary tract infection    Wears dentures    Wears glasses    Past Surgical History:  Procedure Laterality Date   AMPUTATION Left 03/29/2017   Procedure: LEFT GREAT TOE AMPUTATION AT METATARSOPHALANGEAL JOINT;  Surgeon: [name], MD;  Location: [place];  Service: Orthopedics;  Laterality: Left;   AMPUTATION Right 03/29/2017   Procedure: RIGHT BELOW KNEE AMPUTATION;  Surgeon: [name], MD;  Location: [place];  Service: Orthopedics;  Laterality: Right;   AMPUTATION Left 06/06/2018   Procedure: LEFT 2ND TOE AMPUTATION;  Surgeon: [name], MD;  Location: [place];  Service: Orthopedics;  Laterality: Left;   AMPUTATION Right 11/19/2018   Procedure: AMPUTATION ABOVE KNEE;  Surgeon: [name], MD;  Location: [place];  Service: Orthopedics;  Laterality: Right;   ANTERIOR CERVICAL CORPECTOMY N/A 11/25/2015   Procedure: ANTERIOR CERVICAL FIVE CORPECTOMY Cervical four - six fusion;  Surgeon: Consuella Lose, MD;  Location: [facility];  Service: Neurosurgery;  Laterality: N/A;  ANTERIOR CERVICAL FIVE CORPECTOMY Cervical four - six fusion   APPLICATION OF WOUND VAC Right 10/22/2018   Procedure: Application Of Wound Vac;  Surgeon: [name], MD;  Location: [facility];  Service: Orthopedics;  Laterality: Right;   CYSTOSCOPY WITH RETROGRADE URETHROGRAM N/A 04/25/2018   Procedure: CYSTOSCOPY WITH RETROGRADE URETHROGRAM/ BALLOON DILATION;  Surgeon: Kathie Rhodes, MD;  Location[facility];  Service: Urology;  Laterality: N/A;   MULTIPLE TOOTH EXTRACTIONS     POSTERIOR CERVICAL FUSION/FORAMINOTOMY N/A 11/29/2015   Procedure: Cervical Three-Cervical Seven Posterior Cervical Laminectomy with Fusion;  Surgeon: Consuella Lose, MD;  Location: [facility];  Service: Neurosurgery;  Laterality: N/A;  Cervical Three-Cervical Seven Posterior Cervical Laminectomy with Fusion   STUMP REVISION Right 10/22/2018   Procedure: REVISION RIGHT BELOW KNEE AMPUTATION;  Surgeon: [name], MD;  Location: [place];  Service: Orthopedics;  Laterality: Right;   STUMP REVISION Right 12/05/2018   Procedure: Revision Right Above Knee Amputation;  Surgeon: [name], MD;  Location: [place];  Service: Orthopedics;  Laterality: Right;   STUMP REVISION Right 05/29/2019   Procedure: REVISION RIGHT ABOVE KNEE AMPUTATION;  Surgeon: [name], MD;  Location: [place];  Service: Orthopedics;  Laterality: Right;   STUMP  REVISION Right 06/24/2019   Procedure: STUMP REVISION;  Surgeon: [name], MD;  Location: [place];  Service: Orthopedics;  Laterality: Right;   Social History:  reports that he has been smoking cigarettes. He has been smoking an average of .5 packs per day. He has never used smokeless tobacco. He reports that he does not drink alcohol and does not use drugs.  No Known Allergies  Family History  Problem Relation Age of Onset   Diabetes Mother    Heart disease Mother    Cancer Mother    Cancer Father    Bone cancer Father    Prostate cancer Father    Cancer Paternal Grandfather  Cancer Other    Diabetes Other     Prior to Admission medications   Medication Sig Start Date End Date Taking? Authorizing Provider  cetirizine (ZYRTEC) 10 MG tablet Take 10 mg by mouth daily.   Yes [provider]  cyanocobalamin 1000 MCG tablet Take 1,000 mcg by mouth daily.   Yes [provider]  diclofenac Sodium (VOLTAREN) 1 % GEL Apply 4 g topically every 8 (eight) hours as needed (for pain). On right leg   Yes [provider]  docusate sodium (COLACE) 100 MG capsule Take 1 capsule (100 mg total) by mouth 2 (two) times daily. 05/31/19  Yes Amin, Ankit Chirag, MD  eszopiclone (LUNESTA) 2 MG TABS tablet Take 2 mg by mouth at bedtime. Take immediately before bedtime   Yes [provider]  ferrous sulfate 325 (65 FE) MG tablet Take 1 tablet (325 mg total) by mouth daily with breakfast. 11/23/18  Yes Matilde Haymaker, MD  fluticasone [facility]) 50 MCG/ACT nasal spray Place 1 spray into both nostrils daily.   Yes [provider]  furosemide (LASIX) 20 MG tablet Take 20-40 mg by mouth See admin instructions. Take 14m at 9am and 219mat 4pm.   Yes [provider]  gabapentin (NEURONTIN) 300 MG capsule Take 300 mg by mouth 3 (three) times daily.   Yes [provider]  insulin glargine (LANTUS SOLOSTAR) 100 UNIT/ML Solostar Pen Inject 12 Units into the skin  daily. Patient taking differently: Inject 12 Units into the skin 2 (two) times daily. 01/31/22  Yes Dahal, BiMarlowe AschoffMD  Lidocaine 4 % PTCH Apply 2 patches topically daily. Apply one patch to right shoulder at 9am and remove at 9pm. Apply a second patch to Right stump at 9am and remove at 9pm.   Yes [provider]  magnesium oxide (MAG-OX) 400 MG tablet Take 400 mg by mouth 2 (two) times daily.   Yes [provider]  melatonin 3 MG TABS tablet Take 3 mg by mouth at bedtime.   Yes [provider]  methenamine (MANDELAMINE) 1 g tablet Take 1 g by mouth daily.   Yes [provider]  metoprolol tartrate (LOPRESSOR) 25 MG tablet Take 0.5 tablets (12.5 mg total) by mouth 2 (two) times daily. 07/18/19 09/06/22 Yes PoElodia Florence MD  mirabegron ER (MYRBETRIQ) 50 MG TB24 tablet Take 50 mg by mouth daily.   Yes [provider]  Multiple Vitamins-Minerals (MULTIVITAMIN WITH MINERALS) tablet Take 1 tablet by mouth daily.   Yes [provider]  omeprazole (PRILOSEC) 20 MG capsule Take 20 mg by mouth at bedtime.   Yes [provider]  oxybutynin (DITROPAN-XL) 10 MG 24 hr tablet Take 10 mg by mouth daily.   Yes [provider]  oxycodone (ROXICODONE) 30 MG immediate release tablet Take 1 tablet (30 mg total) by mouth every 8 (eight) hours. 09/08/21  Yes FeCharlynne CousinsMD  polyethylene glycol (MIRALAX / GLYCOLAX) 17 g packet Take 17 g by mouth daily.   Yes [provider]  pregabalin (LYRICA) 150 MG capsule Take 1 capsule (150 mg total) by mouth 2 (two) times daily. 07/07/21  Yes SiThurnell LoseMD  rOPINIRole (REQUIP) 2 MG tablet Take 2 mg by mouth 2 (two) times daily.   Yes [provider]  rosuvastatin (CRESTOR) 10 MG tablet Take 10 mg by mouth at bedtime.   Yes [provider]  senna-docusate (SENOKOT-S) 8.6-50 MG tablet Take 1 tablet by mouth at bedtime.  Yes [provider]  SPIRIVA  RESPIMAT 1.25 MCG/ACT AERS Inhale 2 each into the lungs in the morning.   Yes [provider]  tamsulosin (FLOMAX) 0.4 MG CAPS capsule Take 0.8 mg by mouth daily. 11/26/13  Yes [provider]  GLUCAGON HCL IJ Inject 1 mg into the muscle once as needed (hypoglycemia).    [provider]  guaiFENesin 200 MG tablet Take 200 mg by mouth every 6 (six) hours as needed (congestion).    [provider]  hydrOXYzine (ATARAX) 25 MG tablet Take 25 mg by mouth every 6 (six) hours as needed for itching or anxiety.    [provider]  Infant Care Products [facility]) OINT Apply 1 Application topically in the morning and at bedtime. To buttocks    [provider]  insulin lispro (HUMALOG) 100 UNIT/ML KwikPen Inject 0-10 Units into the skin See admin instructions. Inject 0-10 units into the skin three times a day and at bedtime, PER SLIDING SCALE:  BGL 151-200 = 2 units 201-250 = 4 units 251-300 = 6 units 301-350 =  8 units 351-400 = 10 units 01/31/22   Dahal, Marlowe Aschoff, MD  ipratropium-albuterol (DUONEB) 0.5-2.5 (3) MG/3ML SOLN Take 3 mLs by nebulization every 6 (six) hours as needed (Shortness of breath).    [provider]  lactulose (CHRONULAC) 10 GM/15ML solution Take 15 mLs (10 g total) by mouth 3 (three) times daily as needed for moderate constipation. Patient taking differently: Take 10 g by mouth 3 (three) times daily. 01/31/22   Terrilee Croak, MD  levalbuterol (XOPENEX) 0.63 MG/3ML nebulizer solution Take 3 mLs (0.63 mg total) by nebulization every 6 (six) hours as needed for wheezing or shortness of breath. 01/31/22   Terrilee Croak, MD  Menthol, Topical Analgesic, (BIOFREEZE) 4 % GEL Apply 1 Application topically every 8 (eight) hours as needed (for pain). Apply to right leg/stump    [provider]  Meth-Hyo-M Bl-Na Phos-Ph Sal (URIBEL) 118 MG CAPS Take 1 capsule by mouth every 8 (eight) hours as needed (bladder pain).    [provider]  naloxone [facility]) 4 MG/0.1ML LIQD nasal spray kit Place 1 spray into the nose as needed (poorly responsive or turning blue).    [provider]  nystatin cream (MYCOSTATIN) Apply 1 Application topically See admin instructions. Apply to gluteal fold every day shift for wound care. May apply as needed as well.    [provider]  ondansetron (ZOFRAN) 4 MG tablet Take 4 mg by mouth every 8 (eight) hours as needed for nausea.    [provider]  oxyCODONE (OXY IR/ROXICODONE) 5 MG immediate release tablet Take 1 tablet (5 mg total) by mouth every 8 (eight) hours as needed for severe pain (pain). Patient taking differently: Take 5 mg by mouth every 8 (eight) hours as needed (pain). 09/08/21   Charlynne Cousins, MD  OXYGEN Inhale 2 L into the lungs as needed (for shortness of breath).    [provider]  phenazopyridine (PYRIDIUM) 100 MG tablet Take 100 mg by mouth every 8 (eight) hours as needed (dysuria).    [provider]  Polyethyl Glycol-Propyl Glycol (LUBRICANT EYE DROPS) 0.4-0.3 % SOLN Place 2 drops into both eyes every 4 (four) hours as needed (dryness/itching).    [provider]  promethazine (PHENERGAN) 12.5 MG tablet Take 12.5 mg by mouth every 6 (six) hours as needed for nausea or vomiting.    [provider]  tiZANidine (ZANAFLEX) 4 MG  tablet Take 4 mg by mouth every 6 (six) hours as needed for muscle spasms.    [provider]  triamcinolone cream (KENALOG) 0.1 % Apply 1 application  topically 2 (two) times daily. Apply to left lower leg    [provider]  TRULICITY 7.16 RC/7.8LF SOPN Inject 0.75 mg into the skin every Wednesday.    [provider]    Physical Exam: Vitals:   05/01/22 1315 05/01/22 1330 05/01/22 1345 05/01/22 1400  BP: 139/73 139/64 (!) 144/67 (!) 150/67  Pulse: 72 76 75 80  Resp: 18 (!) _0 Temp:      TempSrc:      SpO2: 90% 90% 91% 91%  Weight:       Height:       General: 61 y.o. male resting in bed in NAD Eyes: PERRL, normal sclera ENMT: Nares patent w/o discharge, orophaynx clear, dentition normal, ears w/o discharge/lesions/ulcers Neck: Supple, trachea midline Cardiovascular: RRR, +S1, S2, no m/g/r, equal pulses throughout Respiratory: scattered rhonchi, no w/r, increased WOB on 6L [place] GI: BS+, NDNT, no masses noted, no organomegaly noted MSK: No c/c; RAKA, chronic edema/venous changes of the LLE Neuro: A&O x 3, no focal deficits Psyc: Appropriate interaction and affect, calm/cooperative  Data Reviewed:   Results for orders placed or performed during the hospital encounter of 05/01/22 (from the past 24 hour(s))  Hemoglobin A1c     Status: Abnormal   Collection Time: 05/01/22  8:13 AM  Result Value Ref Range   Hgb A1c MFr Bld 6.9 (H) 4.8 - 5.6 %   Mean Plasma Glucose 151.33 mg/dL  Basic metabolic panel     Status: Abnormal   Collection Time: 05/01/22  8:13 AM  Result Value Ref Range   Sodium 140 135 - 145 mmol/L   Potassium 4.2 3.5 - 5.1 mmol/L   Chloride 102 98 - 111 mmol/L   CO2 28 22 - 32 mmol/L   Glucose, Bld 173 (H) 70 - 99 mg/dL   BUN 37 (H) 8 - 23 mg/dL   Creatinine, Ser 2.07 (H) 0.61 - 1.24 mg/dL   Calcium 9.0 8.9 - 10.3 mg/dL   GFR, Estimated 36 (L) >60 mL/min   Anion gap 10 5 - 15  CBC with Differential/Platelet     Status: Abnormal   Collection Time: 05/01/22  8:15 AM  Result Value Ref Range   WBC 10.8 (H) 4.0 - 10.5 K/uL   RBC 3.99 (L) 4.22 - 5.81 MIL/uL   Hemoglobin 10.7 (L) 13.0 - 17.0 g/dL   HCT 35.1 (L) 39.0 - 52.0 %   MCV 88.0 80.0 - 100.0 fL   MCH 26.8 26.0 - 34.0 pg   MCHC 30.5 30.0 - 36.0 g/dL   RDW 15.5 11.5 - 15.5 %   Platelets 225 150 - 400 K/uL   nRBC 0.0 0.0 - 0.2 %   Neutrophils Relative % 72 %   Neutro Abs 7.9 (H) 1.7 - 7.7 K/uL   Lymphocytes Relative 16 %   Lymphs Abs 1.7 0.7 - 4.0 K/uL   Monocytes Relative 7 %   Monocytes Absolute 0.7 0.1 - 1.0 K/uL   Eosinophils Relative 3 %    Eosinophils Absolute 0.3 0.0 - 0.5 K/uL   Basophils Relative 1 %   Basophils Absolute 0.1 0.0 - 0.1 K/uL   Immature Granulocytes 1 %   Abs Immature Granulocytes 0.05 0.00 - 0.07 K/uL  Glucose, capillary     Status: Abnormal   Collection  Time: 05/01/22  8:23 AM  Result Value Ref Range   Glucose-Capillary 165 (H) 70 - 99 mg/dL  Glucose, capillary     Status: Abnormal   Collection Time: 05/01/22 10:58 AM  Result Value Ref Range   Glucose-Capillary 170 (H) 70 - 99 mg/dL  Glucose, capillary     Status: Abnormal   Collection Time: 05/01/22 12:35 PM  Result Value Ref Range   Glucose-Capillary 176 (H) 70 - 99 mg/dL   CXR: Right upper lung airspace disease concerning for pneumonia.      Family Communication: None at bedside Primary team communication: w/ Dr. Claudia Desanctis Thank you very much for involving [place] in the care of your patient.  Author: Jonnie Finner, DO 05/01/2022 2:26 PM  For on call review [email or web address].

## 2022-05-01 NOTE — H&P (Signed)
CC/HPI: cc: dysuria, OAB, BPH   01/28/20: 61 year old man who is a prior patient of Dr. 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 Patient was last seen by Fritz Pickerel in July. He was using tamsulosin at that time as well as 50 mg of Myrbetriq. Today he comes in with improvement in postvoid burning however it still present. He is being treated for UTI and is finishing up antibiotics. His urinalysis today is not concerning for infection. He denies any gross hematuria. Based on above symptoms and prior history of stricture we decided to proceed with cystoscopy today.   11/10/2020: Cystoscopy at last office visit showed a wide bore patent bulbar urethral stricture without evidence significant or recurrence obstruction. Reassurance provided against this. He was continued on tamsulosin. Patient was prescribed oxybutynin 15 mg ER in addition to his previous prescription for 50 mg Myrbetriq in an effort to better control his urinary urgency and UUI. He was scheduled to follow up in 3 months but has not been seen again until today where he presents for evaluation of worsening lower urinary tract symptoms.   12/15/20: 61 year old man with a history of bulbar urethral stricture last dilated in 2021 comes in with dysuria, urinary urgency and urge incontinence. He was started on Flomax at his last visit. He comes in without any improvement in symptoms. He feels like he is not fully emptying his bladder. PVR 74 cc. He is taking pyridium for dysuria.   01/13/21: 61 yo man with a history of bulbar urethral stricture last dilated in 2021 comes in with dysuria, urinary urgency and urge incontinence. He remains on flomax and myrbetriq. He continues to struggle with UTIs and dysuria. He is finishing course of antibiotics today however urine still looks infected.   02/02/21: 61 year old man with the above history comes in today for cystoscopy. He did start  Bactrim however urine still appears to be infected. In addition he states his symptoms have improved somewhat. He remains on Flomax, Myrbetriq. He is still experiences occasional dysuria and urgency but is better than his last visit in July.   11/15/2021: 61 year old man with a history of intermittent dysuria, overactive bladder and BPH with LUTS as well as bulbar urethral stricture here for follow-up. He was previously managed on tamsulosin 0.4 mg nightly, Myrbetriq 50 mg and methenamine. He had done well for a while however is now experiencing intermittent dysuria and increased urgency. On review of patient's medications it looks like he is no longer on the Myrbetriq or methenamine. He is on tamsulosin 0.4 mg nightly. His UA today does show bacteriuria. He was told he was treated for 2 different UTIs in the last several months.   01/12/2022: 61 year old man with the above history here for follow-up. He is currently on tamsulosin 0.8 mg nightly, Myrbetriq and methenamine. His urinalysis today looks improved from last time. He does use a lot of antibiotics for this chronic dysuria. He was started on Trulicity recently and blood sugars are reportedly better. He continues to have dysuria   03/09/2022: 61 year old man here for cystoscopy. He continues to have significant dysuria.   04/25/2022: 61 year old male with the above history here for preop. He is scheduled for cystoscopy with OptiLube urethral dilation and resection of prostatic lesion. His urinalysis looks to be at his baseline. He continues to have dysuria. He is using Uribel and AZO.     ALLERGIES: No Allergies    MEDICATIONS:  Metoprolol Tartrate  Myrbetriq 50 mg tablet, extended release 24 hr 1 tablet PO Daily  Omeprazole 20 mg tablet, delayed release  Phenazopyridine Hcl 200 mg tablet 1 tablet PO TID PRN dysuria  Tamsulosin Hcl 0.4 mg capsule 2 capsule PO Q HS  Albuterol Sulfate Hfa  Amlodipine Besylate 10 mg tablet  Biofreeze 4 % gel   Bupropion Xl 300 mg tablet, extended release 24 hr  Claritin 10 mg capsule  Dermacloud ointment  Diclofenac Sodium 1 % gel  Docusate Sodium 100 mg tablet  Duloxetine Hcl 60 mg capsule,delayed release  Eszopiclone 3 mg tablet  Ferrous Sulfate 325 mg (65 mg iron) tablet  Flonase Allergy Relief  Furosemide 40 mg tablet  Furosemide 20 mg tablet  Gabapentin 300 mg capsule  Glipizide 10 mg tablet  Glucagon Hcl  Glycolic Acid  Humalog Kwikpen U-100  Hydroxyzine Hcl 25 mg tablet  Insulin Glargine  Ipratropium-Albuterol  Lactulose  Lantus Solostar  Lasix 40 mg tablet  Levalbuterol Hcl 1.25 mg/3 ml vial, nebulizer  Lidocaine Patch  Lyrica 150 mg capsule  Magnesium Oxide 400  Melatonin 3 mg tablet  Methenamine Hippurate 1 gram tablet 1 tablet PO Daily  Milk Of Magnesia  Mirabegron  Mylanta  Narcan Liquid  Nasonex Suspension  Nicotine Patch 14 mg/24 hour patch, transdermal 24 hours  Nicotine Patch 7 mg/24 hour patch, transdermal 24 hours  Nystatin 100,000 unit/gram cream  Nystatin Cream  Oxybutynin Chloride Er 15 mg tablet, extended release 24 hr  Oxycodone Hcl 30 mg tablet  Oxycodone Hcl 5 mg tablet  Promethazine Hcl  Ropinirole Hcl 0.25 mg tablet  Rosuvastatin Calcium 10 mg tablet  Senna-Docusate Sodium 8.6 mg-50 mg tablet  Spiriva Respimat 1.25 mcg/actuation mist inhaler  Tizanidine Hcl 2 mg capsule  Triamcinolone Acetonide 0.1 % cream  Trulicity  Uribel 811 BJ-47 mg-40.8 mg-36 mg-0.12 mg capsule 1 capsule PO TID PRN  Vitamin B 12  Zofran     GU PSH: Cysto Dilate Stricture (M or F) - 2019 Cystoscopy - 03/09/2022, 2021, 2019       PSH Notes: No Surgical Problems   NON-GU PSH: No Non-GU PSH        GU PMH: BPH w/LUTS - 03/09/2022, - 01/12/2022, - 11/15/2021, - 02/02/2021, - 01/13/2021, - 12/15/2020 (Stable), He does have BPH and is currently on maximum medical management. He has a slight elevation of his PVR which I am not sure is clinically significant., - 2019 Chronic  cystitis (w/o hematuria) - 03/09/2022, - 01/12/2022 Dysuria - 03/09/2022, - 01/12/2022, - 11/15/2021, - 02/02/2021, - 01/13/2021, - 12/15/2020, - 11/10/2020 Urinary Frequency - 01/12/2022, - 11/15/2021, - 12/15/2020, - 11/10/2020, - 2021 Urinary Urgency - 11/15/2021, (Stable), - 2021, He now has urgency with urge incontinence. I have recommended he remain on his current medications and then I will add further medication as his problem is not a failure to empty but rather a failure to store., - 2019 Bulbar urethral stricture - 02/02/2021, I found a bulbar urethral stricture that is pretty significant and therefore have recommended balloon dilation. I will also perform a retrograde urethrogram to assess its length. We discussed the procedure in detail, its probability of success, the risks and complications, the outpatient nature of the procedure as well as the anticipated postoperative course and the greatest risk being that of recurrence of his stricture., - 2019 Incomplete bladder emptying - 02/02/2021, - 11/10/2020, He is not emptying his bladder completely and this is at least impart likely due to his bulbar  urethral stricture that I diagnosed today., - 2019 Urge incontinence - 02/02/2021, - 01/13/2021, - 12/15/2020, - 11/10/2020 (Stable), - 2021, I am going to have him 1st try Myrbetriq 25 mg for 2 weeks. If this is effective he will contact me otherwise he will try the 50 mg dose and then I want him to return in 4 weeks for reassessment and a check of his PVR., - 2019 Acute Cystitis/UTI - 01/13/2021, - 12/15/2020 Encounter for Prostate Cancer screening, He has not had a PSA done in some time. I will obtain a screening - 2019 BPH w/o LUTS, BPH (benign prostatic hypertrophy) - 2017 Urinary Retention, Acute urinary retention - 2017      PMH Notes: Urinary retention: In 2/17 he was found to be in urinary retention and with catheterization had a postobstructive diuresis indicating long-standing obstruction. He underwent an  unsuccessful voiding trial during a hospitalization in 3/17 and in the office in 4/17. He indicated he had been on Flomax in the past and had experienced frequency, hesitancy and nocturia as well as intermittency with a slow stream.   Urodynamics 10/13/15: He was unable to generate a contraction and was therefore taught intermittent self-catheterization with recommendations to perform this 5 times per day in order to maintain a volume of less than 400 cc.    NON-GU PMH: Pyuria/other UA findings - 02/02/2021 Osteomyelitis, unspecified, Bone infection - 2017 Other chronic pain, Chronic pain - 2017 Personal history of other diseases of the circulatory system, History of hypertension - 2017 Personal history of other diseases of the musculoskeletal system and connective tissue, History of osteomyelitis - 2017 Personal history of other diseases of the respiratory system, History of acute respiratory failure - 2017 Personal history of other endocrine, nutritional and metabolic disease, History of type 2 diabetes mellitus - 2017 Personal history of other mental and behavioral disorders, History of depression - 2017 Pneumonia due to Methicillin resistant Staphylococcus aureus, Methicillin resistant Staphylococcus aureus pneumonia - 2017 Encounter for general adult medical examination without abnormal findings, Encounter for preventive health examination    FAMILY HISTORY: Deceased - Runs In Family Prostate Cancer - Runs In Family   SOCIAL HISTORY: Marital Status: Single    REVIEW OF SYSTEMS:     GU Review Male:  Patient reports frequent urination, trouble starting your stream, leakage of urine, and burning/ pain with urination. Patient denies penile pain, get up at night to urinate, hard to postpone urination, stream starts and stops, erection problems, and have to strain to urinate .    Gastrointestinal (Upper):  Patient denies nausea, vomiting, and indigestion/ heartburn.    Gastrointestinal (Lower):   Patient denies diarrhea and constipation.    Constitutional:  Patient denies fever, night sweats, weight loss, and fatigue.    Skin:  Patient denies skin rash/ lesion and itching.    Eyes:  Patient denies blurred vision and double vision.    Ears/ Nose/ Throat:  Patient denies sore throat and sinus problems.    Hematologic/Lymphatic:  Patient denies swollen glands and easy bruising.    Cardiovascular:  Patient denies leg swelling and chest pains.    Respiratory:  Patient denies cough and shortness of breath.    Endocrine:  Patient denies excessive thirst.    Musculoskeletal:  Patient denies back pain and joint pain.    Neurological:  Patient denies headaches and dizziness.    Psychologic:  Patient denies depression and anxiety.    VITAL SIGNS:       04/25/2022 10:24 AM  BP 120/64 mmHg     Pulse 73 /min     Temperature 97.8 F / 36.5 C     MULTI-SYSTEM PHYSICAL EXAMINATION:      Constitutional: Well-nourished. No physical deformities. Normally developed. Good grooming.     Neck: Neck symmetrical, not swollen. Normal tracheal position.     Respiratory: No labored breathing, no use of accessory muscles.      Skin: No paleness, no jaundice, no cyanosis. No lesion, no ulcer, no rash.     Neurologic / Psychiatric: Oriented to time, oriented to place, oriented to person. No depression, no anxiety, no agitation.     Eyes: Normal conjunctivae. Normal eyelids.     Ears, Nose, Mouth, and Throat: Left ear no scars, no lesions, no masses. Right ear no scars, no lesions, no masses. Nose no scars, no lesions, no masses. Normal hearing. Normal lips.     Musculoskeletal: Normal gait and station of head and neck.            Complexity of Data:   Records Review:  Previous Patient Records, POC Tool  Urine Test Review:  Urinalysis    04/17/18  PSA  Total PSA 0.57 ng/mL    PROCEDURES:    Urinalysis w/Scope  Dipstick Dipstick Cont'd Micro  Color: Green Bilirubin: Neg mg/dL WBC/hpf: 0 - 5/hpf   Appearance: Cloudy Ketones: Neg mg/dL RBC/hpf: NS (Not Seen)  Specific Gravity: 1.020 Blood: Neg ery/uL Bacteria: Few (10-25/hpf)  pH: 5.5 Protein: 2+ mg/dL Cystals: NS (Not Seen)  Glucose: Neg mg/dL Urobilinogen: 0.2 mg/dL Casts: NS (Not Seen)   Nitrites: Neg Trichomonas: Not Present   Leukocyte Esterase: Neg leu/uL Mucous: Not Present    Epithelial Cells: 0 - 5/hpf    Yeast: NS (Not Seen)    Sperm: Not Present    ASSESSMENT:     ICD-10 Details  1 GU:  Dysuria - R30.0 Chronic, Stable  2  Chronic cystitis (w/o hematuria) - N30.20 Chronic, Stable   PLAN:   Document  Letter(s):  Created for Patient: Clinical Summary   Notes:  Dysuria/chronic cystitis: Patient to continue AZO and Uribel as needed. He is rescheduled for surgery on 05/01/2022. We will plan to resect the lesion seen at prostatic urethra as well as do urethral dilation of the prostatic urethra. He understands he will have a Foley catheter following the procedure.

## 2022-05-01 NOTE — Discharge Instructions (Addendum)
Cystoscopy with prostatic urethral biopsy and dilation patient instructions  Following a cystoscopy, a catheter (a flexible rubber tube) is sometimes left in place to empty the bladder. This may cause some discomfort or a feeling that you need to urinate. Your doctor determines the period of time that the catheter will be left in place. You may have bloody urine for two to three days (Call your doctor if the amount of bleeding increases or does not subside).  You may pass blood clots in your urine, especially if you had a biopsy. It is not unusual to pass small blood clots and have some bloody urine a couple of weeks after your cystoscopy. Again, call your doctor if the bleeding does not subside. You may have: Dysuria (painful urination) Frequency (urinating often) Urgency (strong desire to urinate)  These symptoms are common especially if medicine is instilled into the bladder or a ureteral stent is placed. Avoiding alcohol and caffeine, such as coffee, tea, and chocolate, may help relieve these symptoms. Drink plenty of water, unless otherwise instructed. Your doctor may also prescribe an antibiotic or other medicine to reduce these symptoms.  Cystoscopy results are available soon after the procedure; biopsy results usually take two to four days. Your doctor will discuss the results of your exam with you. Before you go home, you will be given specific instructions for follow-up care. Special Instructions:   If you are going home with a catheter in place do not take a tub bath until removed by your doctor.   You may resume your normal activities.   Do not drive or operate machinery if you are taking narcotic pain medicine.   Be sure to keep all follow-up appointments with your doctor.   Call Your Doctor If: The catheter is not draining You have severe pain You are unable to urinate You have a fever over 101 You have severe bleeding

## 2022-05-01 NOTE — Anesthesia Procedure Notes (Signed)
Procedure Name: Intubation Date/Time: 05/01/2022 11:45 AM  Performed by: Sharlette Dense, CRNAPatient Re-evaluated:Patient Re-evaluated prior to induction Oxygen Delivery Method: Circle system utilized Preoxygenation: Pre-oxygenation with 100% oxygen Induction Type: IV induction, Rapid sequence and Cricoid Pressure applied Laryngoscope Size: Glidescope and 4 Grade View: Grade I Tube type: Oral Tube size: 7.5 mm Number of attempts: 1 Airway Equipment and Method: Rigid stylet and Video-laryngoscopy Placement Confirmation: ETT inserted through vocal cords under direct vision, positive ETCO2 and breath sounds checked- equal and bilateral Secured at: 22 cm Tube secured with: Tape Dental Injury: Teeth and Oropharynx as per pre-operative assessment  Difficulty Due To: Difficulty was anticipated, Difficult Airway- due to large tongue, Difficult Airway- due to reduced neck mobility and Difficult Airway- due to limited oral opening Comments: Known difficult intubation

## 2022-05-01 NOTE — Progress Notes (Signed)
Patient brought his Afrin spray from his residence and was keeping at his side in bed. This RN noted there wasn't an order for it. I reached out to Gershon Cull NP to see if the Flonase that was ordered was ordered in lieu of Afrin as it is for nasal congestion. The provider stated yes and that Afrin could not be ordered due to its tolerance/addiction potential and can exacerbate his HTN.   Below is the following discussion:  Hi James. I saw Dr Marylyn Ishihara was consulted on patients care earlier. He is requesting a nicotine patch and also... patient brought his Afrin spray from home and it wasn't on his PTA med list. I just updated his med list with it, and he brought his home supply. Can we have it re-ordered?   9:21 PM JD Kathryne Eriksson, NP looking   9:31 PM thank you I see you reordered the nicotine patch  9:33 PM [place], NP you're welcome   9:34 PM Can we only do the flonase and not the afrin also?  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 yeh I mean he actually could be causing some of his BP problems , or at least making it worse to some degree, by that med   10:11 PM I agree   I went in at 2300 to speak to the patient about the adverse effects of (that it could be contributing to his hypertension) and said since there is no order, I would need him to put it into his bag until he could speak to Dr. Claudia Desanctis or one of her assistants about it in the morning. I offered the flonase in lieu of in the meantime, but he refused the flonase. Patient refused to have me put the Afrin away in his belonging bag and would not let it leave his bed. This RN told him I would be documenting the declination to not take his Afrin. Patient told this RN that Dr. Claudia Desanctis gave permission for him  to bring the Afrin to use after surgery. Patient stated he didn't appreciate me telling him what to do. I simply responded that I was doing my job according to out unit and hospital's policy and guidelines. Patient stated "You are doing your job a little too much."

## 2022-05-02 ENCOUNTER — Encounter (HOSPITAL_COMMUNITY): Payer: Self-pay | Admitting: Urology

## 2022-05-02 ENCOUNTER — Other Ambulatory Visit: Payer: Self-pay

## 2022-05-02 DIAGNOSIS — G894 Chronic pain syndrome: Secondary | ICD-10-CM | POA: Diagnosis present

## 2022-05-02 DIAGNOSIS — I130 Hypertensive heart and chronic kidney disease with heart failure and stage 1 through stage 4 chronic kidney disease, or unspecified chronic kidney disease: Secondary | ICD-10-CM | POA: Diagnosis present

## 2022-05-02 DIAGNOSIS — J440 Chronic obstructive pulmonary disease with acute lower respiratory infection: Secondary | ICD-10-CM | POA: Diagnosis not present

## 2022-05-02 DIAGNOSIS — G2581 Restless legs syndrome: Secondary | ICD-10-CM | POA: Diagnosis present

## 2022-05-02 DIAGNOSIS — E1122 Type 2 diabetes mellitus with diabetic chronic kidney disease: Secondary | ICD-10-CM | POA: Diagnosis present

## 2022-05-02 DIAGNOSIS — N1832 Chronic kidney disease, stage 3b: Secondary | ICD-10-CM | POA: Diagnosis present

## 2022-05-02 DIAGNOSIS — N35912 Unspecified bulbous urethral stricture, male: Secondary | ICD-10-CM | POA: Diagnosis present

## 2022-05-02 DIAGNOSIS — Z89412 Acquired absence of left great toe: Secondary | ICD-10-CM | POA: Diagnosis not present

## 2022-05-02 DIAGNOSIS — J441 Chronic obstructive pulmonary disease with (acute) exacerbation: Secondary | ICD-10-CM | POA: Diagnosis not present

## 2022-05-02 DIAGNOSIS — E785 Hyperlipidemia, unspecified: Secondary | ICD-10-CM | POA: Diagnosis present

## 2022-05-02 DIAGNOSIS — D631 Anemia in chronic kidney disease: Secondary | ICD-10-CM | POA: Diagnosis present

## 2022-05-02 DIAGNOSIS — Z89422 Acquired absence of other left toe(s): Secondary | ICD-10-CM | POA: Diagnosis not present

## 2022-05-02 DIAGNOSIS — I5032 Chronic diastolic (congestive) heart failure: Secondary | ICD-10-CM | POA: Diagnosis present

## 2022-05-02 DIAGNOSIS — Z8616 Personal history of COVID-19: Secondary | ICD-10-CM | POA: Diagnosis not present

## 2022-05-02 DIAGNOSIS — D413 Neoplasm of uncertain behavior of urethra: Secondary | ICD-10-CM | POA: Diagnosis present

## 2022-05-02 DIAGNOSIS — E1140 Type 2 diabetes mellitus with diabetic neuropathy, unspecified: Secondary | ICD-10-CM | POA: Diagnosis present

## 2022-05-02 DIAGNOSIS — J9601 Acute respiratory failure with hypoxia: Secondary | ICD-10-CM | POA: Diagnosis not present

## 2022-05-02 DIAGNOSIS — F419 Anxiety disorder, unspecified: Secondary | ICD-10-CM | POA: Diagnosis present

## 2022-05-02 DIAGNOSIS — E1165 Type 2 diabetes mellitus with hyperglycemia: Secondary | ICD-10-CM | POA: Diagnosis present

## 2022-05-02 DIAGNOSIS — J189 Pneumonia, unspecified organism: Secondary | ICD-10-CM | POA: Diagnosis present

## 2022-05-02 DIAGNOSIS — Z89511 Acquired absence of right leg below knee: Secondary | ICD-10-CM | POA: Diagnosis not present

## 2022-05-02 DIAGNOSIS — F17210 Nicotine dependence, cigarettes, uncomplicated: Secondary | ICD-10-CM | POA: Diagnosis present

## 2022-05-02 DIAGNOSIS — N135 Crossing vessel and stricture of ureter without hydronephrosis: Secondary | ICD-10-CM | POA: Diagnosis present

## 2022-05-02 DIAGNOSIS — E669 Obesity, unspecified: Secondary | ICD-10-CM | POA: Diagnosis present

## 2022-05-02 LAB — CBC WITH DIFFERENTIAL/PLATELET
Abs Immature Granulocytes: 0.08 10*3/uL — ABNORMAL HIGH (ref 0.00–0.07)
Basophils Absolute: 0.1 10*3/uL (ref 0.0–0.1)
Basophils Relative: 1 %
Eosinophils Absolute: 0 10*3/uL (ref 0.0–0.5)
Eosinophils Relative: 0 %
HCT: 32.4 % — ABNORMAL LOW (ref 39.0–52.0)
Hemoglobin: 10.1 g/dL — ABNORMAL LOW (ref 13.0–17.0)
Immature Granulocytes: 1 %
Lymphocytes Relative: 20 %
Lymphs Abs: 2.1 10*3/uL (ref 0.7–4.0)
MCH: 27.4 pg (ref 26.0–34.0)
MCHC: 31.2 g/dL (ref 30.0–36.0)
MCV: 87.8 fL (ref 80.0–100.0)
Monocytes Absolute: 0.7 10*3/uL (ref 0.1–1.0)
Monocytes Relative: 6 %
Neutro Abs: 7.5 10*3/uL (ref 1.7–7.7)
Neutrophils Relative %: 72 %
Platelets: 202 10*3/uL (ref 150–400)
RBC: 3.69 MIL/uL — ABNORMAL LOW (ref 4.22–5.81)
RDW: 15.1 % (ref 11.5–15.5)
WBC: 10.4 10*3/uL (ref 4.0–10.5)
nRBC: 0 % (ref 0.0–0.2)

## 2022-05-02 LAB — COMPREHENSIVE METABOLIC PANEL
ALT: 7 U/L (ref 0–44)
AST: 14 U/L — ABNORMAL LOW (ref 15–41)
Albumin: 3 g/dL — ABNORMAL LOW (ref 3.5–5.0)
Alkaline Phosphatase: 53 U/L (ref 38–126)
Anion gap: 7 (ref 5–15)
BUN: 32 mg/dL — ABNORMAL HIGH (ref 8–23)
CO2: 27 mmol/L (ref 22–32)
Calcium: 8.8 mg/dL — ABNORMAL LOW (ref 8.9–10.3)
Chloride: 103 mmol/L (ref 98–111)
Creatinine, Ser: 1.93 mg/dL — ABNORMAL HIGH (ref 0.61–1.24)
GFR, Estimated: 39 mL/min — ABNORMAL LOW (ref >60)
Glucose, Bld: 195 mg/dL — ABNORMAL HIGH (ref 70–99)
Potassium: 3.7 mmol/L (ref 3.5–5.1)
Sodium: 137 mmol/L (ref 135–145)
Total Bilirubin: 0.4 mg/dL (ref 0.3–1.2)
Total Protein: 6.5 g/dL (ref 6.5–8.1)

## 2022-05-02 LAB — SURGICAL PATHOLOGY

## 2022-05-02 LAB — GLUCOSE, CAPILLARY
Glucose-Capillary: 281 mg/dL — ABNORMAL HIGH (ref 70–99)
Glucose-Capillary: 209 mg/dL — ABNORMAL HIGH (ref 70–99)
Glucose-Capillary: 293 mg/dL — ABNORMAL HIGH (ref 70–99)
Glucose-Capillary: 240 mg/dL — ABNORMAL HIGH (ref 70–99)

## 2022-05-02 LAB — BRAIN NATRIURETIC PEPTIDE: B Natriuretic Peptide: 168.8 pg/mL — ABNORMAL HIGH (ref 0.0–100.0)

## 2022-05-02 LAB — PROCALCITONIN: Procalcitonin: <0.1 ng/mL

## 2022-05-02 NOTE — Progress Notes (Signed)
Patient is refusing to turn to his side to have his skin assessment completed, and also refused vital signs for the 12 am hour. Charge RN Duffy Rhody notified of patient's refusals of care.

## 2022-05-02 NOTE — Progress Notes (Signed)
S: Admitted postop for respiratory failure and possible pneumonia.  Pt anxious.  O: NAD, laying in bed comfortably Foley draining well   A/P: POD#1 s/p prostatic urethral biopsy and urethral stricture dilation.   -keep foley until urology follow up -pathology pending -appreciate medicine taking over patient

## 2022-05-02 NOTE — Progress Notes (Signed)
PROGRESS NOTE    Gregory [name]  MIW:[id number] DOB: January 07, 1961 DOA: 05/01/2022 PCP: [name], MD   Brief Narrative: This 61 years old male with PMH significant for type 2 diabetes, hypertension, HFp EF, COPD, hyperlipidemia admitted under urology service for ureteral stricture.  Patient was taken to the OR for diagnostic cystoscopy, prostatic urethral biopsy and urethral dilatation.  Postprocedure patient became hypoxic and required up to 6 L of supplemental oxygen.  We were consulted for acute hypoxic respiratory failure.  Patient does not use oxygen at baseline but reports having increased cough and sputum production for last 2 days.  Patient denies any fever, sick contacts.  He remains compliant with his CHF and COPD medications.  Chest x-ray confirms right upper lobe pneumonia started on empiric antibiotics. We took over as primary since patient has developed acute hypoxic respiratory failure.  Assessment & Plan:   Principal Problem:   Pneumonia Active Problems:   Acute hypoxic respiratory failure (HCC)   Right upper lobe pneumonia   COPD (chronic obstructive pulmonary disease) (HCC)   Chronic diastolic HF (heart failure) (HCC)   HTN (hypertension)   HLD (hyperlipidemia)   DM2 (diabetes mellitus, type 2) (HCC)   Stage 3b chronic kidney disease (CKD) (HCC)   BPH (benign prostatic hyperplasia)  Acute hypoxic respiratory failure: Right upper lobe pneumonia: Patient reports having productive cough and shortness of breath for last 2 days.   He denies any fever or sick contacts.  He does not use oxygen at baseline. Post procedure patient became hypoxic requiring 6 L of supplemental oxygen. Chest x-ray confirms right upper lobe pneumonia. Started on empiric antibiotics ( ceftriaxone and Zithromax for CAP coverage). Continue Duoneb nebulization, guanfacine Continue inspiratory spirometry and flutter valve Continue supplemental oxygen and wean as tolerated. Follow-up  Legionella and strep pneumo urinary antigen.   COPD: Continue DuoNeb nebulization every 6 hours. Continue home inhalers.  Type 2 diabetes: Carb modified diet, Hold p.o. diabetic medications Regular insulin sliding scale.  CKD stage IIIb: Serum creatinine at baseline,  Avoid nephrotoxic medications  Chronic diastolic CHF: Appears compensated heart failure. Continue Lasix 40 mg daily, metoprolol 12.5 BID, Crestor  Hyperlipidemia Continue Crestor 10 mg daily  Essential hypertension Continue metoprolol 12.5 mg every 12 hours.  Urethral stricture: S/p cystoscopy with prostatic urethral biopsy. Further management as per urology  DVT prophylaxis: Lovenox Code Status: Full code Family Communication: No family at bedside Disposition Plan:   Status is: Observation The patient remains OBS appropriate and will d/c before 2 midnights.   Admitted under urology service but then developed acute hypoxic respiratory failure secondary to pneumonia requiring IV antibiotics and supplemental oxygen.  Patient is not medically clear.  Consultants:  Medicine  Procedures: None Antimicrobials: Anti-infectives (From admission, onward)    Start     Dose/Rate Route Frequency Ordered Stop   05/01/22 1827  Urelle (URELLE/URISED) 81 MG tablet 81 mg        1 tablet Oral Every 8 hours PRN 05/01/22 1827     05/01/22 1600  azithromycin (ZITHROMAX) 500 mg in sodium chloride 0.9 % 250 mL IVPB        500 mg 250 mL/hr over 60 Minutes Intravenous Every 24 hours 05/01/22 1505     05/01/22 1600  cefTRIAXone (ROCEPHIN) 1 g in sodium chloride 0.9 % 100 mL IVPB        1 g 200 mL/hr over 30 Minutes Intravenous Every 24 hours 05/01/22 1505     05/01/22 1546  Uribel  118 MG CAPS 118 mg  Status:  Discontinued        1 capsule Oral Every 8 hours PRN 05/01/22 1547 05/01/22 1827   05/01/22 1150  ceFAZolin (ANCEF) 2-4 GM/100ML-% IVPB       Note to Pharmacy: Jasmine Pang K: cabinet override      05/01/22 1150  05/01/22 2359       Subjective: Patient was seen and examined at bedside.  Overnight events noted. Patient reports doing better, Remains on 2 L of supplemental oxygen.   States he is feeling anxious and asking for anxiety medication.  Objective: Vitals:   05/02/22 0408 05/02/22 0627 05/02/22 0828 05/02/22 1255  BP: (!) 150/66  (!) 154/72 (!) 146/62  Pulse: 66  72 62  Resp: '20  18 16  '$ Temp: 98.2 F (36.8 C)  97.9 F (36.6 C) 98 F (36.7 C)  TempSrc: Oral  Oral Oral  SpO2: 95% 96% 95% 96%  Weight:      Height:        Intake/Output Summary (Last 24 hours) at 05/02/2022 1342 Last data filed at 05/02/2022 1230 Gross per 24 hour  Intake 746.67 ml  Output 1750 ml  Net -1003.33 ml   Filed Weights   05/01/22 0716  Weight: 111.1 kg    Examination:  General exam: Appears comfortable, not in any acute distress.  Deconditioned. Respiratory system: CTA bilaterally, respiratory effort normal, RR 15. Cardiovascular system: S1 & S2 heard, regular rate and rhythm, no murmur. Gastrointestinal system: Abdomen is soft, non tender, non distended, BS+ Central nervous system: Alert and oriented x 3. No focal neurological deficits. Extremities: Right AKA, left leg lymphedema. Skin: No rashes, lesions or ulcers Psychiatry: Judgement and insight appear normal. Mood & affect appropriate.     Data Reviewed: I have personally reviewed following labs and imaging studies  CBC: Recent Labs  Lab 05/01/22 0815  WBC 10.8*  NEUTROABS 7.9*  HGB 10.7*  HCT 35.1*  MCV 88.0  PLT 952   Basic Metabolic Panel: Recent Labs  Lab 05/01/22 0813  NA 140  K 4.2  CL 102  CO2 28  GLUCOSE 173*  BUN 37*  CREATININE 2.07*  CALCIUM 9.0   GFR: Estimated Creatinine Clearance: 47.6 mL/min (A) (by C-G formula based on SCr of 2.07 mg/dL (H)). Liver Function Tests: No results for input(s): "AST", "ALT", "ALKPHOS", "BILITOT", "PROT", "ALBUMIN" in the last 168 hours. No results for input(s):  "LIPASE", "AMYLASE" in the last 168 hours. No results for input(s): "AMMONIA" in the last 168 hours. Coagulation Profile: No results for input(s): "INR", "PROTIME" in the last 168 hours. Cardiac Enzymes: No results for input(s): "CKTOTAL", "CKMB", "CKMBINDEX", "TROPONINI" in the last 168 hours. BNP (last 3 results) No results for input(s): "PROBNP" in the last 8760 hours. HbA1C: Recent Labs    05/01/22 0813  HGBA1C 6.9*   CBG: Recent Labs  Lab 05/01/22 1058 05/01/22 1235 05/01/22 2007 05/02/22 0737 05/02/22 1252  GLUCAP 170* 176* 339* 281* 209*   Lipid Profile: No results for input(s): "CHOL", "HDL", "LDLCALC", "TRIG", "CHOLHDL", "LDLDIRECT" in the last 72 hours. Thyroid Function Tests: No results for input(s): "TSH", "T4TOTAL", "FREET4", "T3FREE", "THYROIDAB" in the last 72 hours. Anemia Panel: No results for input(s): "VITAMINB12", "FOLATE", "FERRITIN", "TIBC", "IRON", "RETICCTPCT" in the last 72 hours. Sepsis Labs: No results for input(s): "PROCALCITON", "LATICACIDVEN" in the last 168 hours.  No results found for this or any previous visit (from the past 240 hour(s)).   Radiology Studies: X-ray chest  PA or AP  Result Date: 05/01/2022 CLINICAL DATA:  Cough EXAM: CHEST  1 VIEW COMPARISON:  Radiograph 01/27/2022 FINDINGS: Unchanged mildly enlarged cardiac silhouette. There is right upper lung airspace disease. No pleural effusion. No evidence of pneumothorax. No acute osseous abnormality. Cervical spine fusion hardware noted. IMPRESSION: Right upper lung airspace disease concerning for pneumonia. Electronically Signed   By: Maurine Simmering M.D.   On: 05/01/2022 14:43   DG C-Arm 1-60 Min-No Report  Result Date: 05/01/2022 Fluoroscopy was utilized by the requesting physician.  No radiographic interpretation.    Scheduled Meds:  albuterol  2.5 mg Nebulization BID   Chlorhexidine Gluconate Cloth  6 each Topical Daily   cyanocobalamin  1,000 mcg Oral Daily   docusate sodium   100 mg Oral BID   Dulaglutide  0.75 mg Subcutaneous Q Wed   ferrous sulfate  325 mg Oral Q breakfast   fluticasone  1 spray Each Nare Daily   furosemide  20 mg Oral q1600   furosemide  40 mg Oral Daily   gabapentin  300 mg Oral TID   insulin aspart  0-15 Units Subcutaneous TID WC   insulin aspart  0-5 Units Subcutaneous QHS   lactulose  10 g Oral TID   lidocaine  2 patch Transdermal Q24H   loratadine  10 mg Oral Daily   magnesium oxide  400 mg Oral BID   melatonin  3 mg Oral QHS   metoprolol tartrate  12.5 mg Oral BID   mirabegron ER  50 mg Oral Daily   multivitamin with minerals  1 tablet Oral Daily   nicotine  7 mg Transdermal Daily   nystatin cream  1 Application Topical Daily   oxybutynin  10 mg Oral Daily   oxycodone  30 mg Oral Q8H   pantoprazole  40 mg Oral Daily   pregabalin  150 mg Oral BID   rOPINIRole  2 mg Oral BID   rosuvastatin  10 mg Oral QHS   senna  1 tablet Oral BID   sodium chloride flush  3 mL Intravenous Q12H   tamsulosin  0.8 mg Oral Daily   triamcinolone cream  1 Application Topical BID   umeclidinium bromide  1 puff Inhalation Daily   Continuous Infusions:  sodium chloride     azithromycin Stopped (05/01/22 1941)   cefTRIAXone (ROCEPHIN)  IV       LOS: 0 days    Time spent: [place], MD [facility]   If 7PM-7AM, please contact night-coverage

## 2022-05-02 NOTE — Progress Notes (Signed)
RT called per patient endorsing feeling short of breath and needing a breathing treatment. O2 sats are 94% on 3 L,

## 2022-05-02 NOTE — TOC Initial Note (Signed)
Transition of Care [facility]) - Initial/Assessment Note    Patient Details  Name: Gregory [name] MRN: [id number] Date of Birth: [birth year]  [facility]) CM/SW Contact:    Dessa Phi, RN Phone Number: 05/02/2022, 11:33 AM  Clinical Narrative: [address] rep [place] confirmed LTC to return.                   Expected Discharge Plan: Long Term Nursing Home Barriers to Discharge: Continued Medical Work up   Patient Goals and CMS Choice        Expected Discharge Plan and Services Expected Discharge Plan: [place]         Expected Discharge Date: 05/01/22                                    Prior Living Arrangements/Services                       Activities of Daily Living Home Assistive Devices/Equipment: Wheelchair (motorized wheelchair he brought from home) ADL Screening (condition at time of admission) Patient's cognitive ability adequate to safely complete daily activities?: Yes Is the patient deaf or have difficulty hearing?: No Does the patient have difficulty seeing, even when wearing glasses/contacts?: No Does the patient have difficulty concentrating, remembering, or making decisions?: No Patient able to express need for assistance with ADLs?: Yes Does the patient have difficulty dressing or bathing?: Yes Independently performs ADLs?: No Communication: Independent Dressing (OT): Needs assistance Is this a change from baseline?: Pre-admission baseline Grooming: Needs assistance Is this a change from baseline?: Pre-admission baseline Feeding: Independent Bathing: Needs assistance Is this a change from baseline?: Pre-admission baseline Toileting: Needs assistance Is this a change from baseline?: Pre-admission baseline In/Out Bed: Needs assistance Is this a change from baseline?: Pre-admission baseline Walks in Home: Needs assistance Is this a change from baseline?: Pre-admission baseline Does the patient have  difficulty walking or climbing stairs?: Yes Weakness of Legs: Both Weakness of Arms/Hands: None  Permission Sought/Granted                  Emotional Assessment              Admission diagnosis:  Pneumonia [J18.9] Patient Active Problem List   Diagnosis Date Noted   Pneumonia 05/01/2022   Right upper lobe pneumonia 09/05/2021   Hyponatremia 09/05/2021   Anxiety 09/05/2021   BPH (benign prostatic hyperplasia) 09/05/2021   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   Asymptomatic bacteriuria 06/30/2021   Normocytic anemia 06/29/2021   Pain of amputation stump of right lower extremity ([place]) 03/15/2021   Melanoma of cheek ([place]) 10/15/2019   Acute hypoxic respiratory failure ([place]) 07/11/2019   Acute respiratory disease due to COVID-19 virus 07/11/2019   Wound infection 06/22/2019   Abscess 06/22/2019   Stage 3b chronic kidney disease (CKD) ([place]) 05/23/2019   Tobacco use 05/23/2019   Sepsis ([place]) 05/23/2019   S/P AKA (above knee amputation) unilateral, right ([place]) 12/05/2018   Amputation stump infection ([place])    Lymphedema of left leg    Diabetic polyneuropathy associated with type 2 diabetes mellitus ([place])    Above knee amputation of right lower extremity ([place]) 10/22/2018   Dehiscence of amputation stump ([place])    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[place]) 08/12/2018   Candidiasis 08/12/2018   Necrosis of toe ([place]) 07/08/2018   Amputated toe of  left foot ([place]) 06/18/2018   Streptococcal infection    Pressure injury, stage 3 ([place]) 06/06/2018   Cutaneous abscess of left foot    Moderate protein-calorie malnutrition ([place])    Septic arthritis ([place]) 06/03/2018   Idiopathic chronic venous hypertension of left lower extremity with ulcer and inflammation ([place]) 11/14/2017   Great toe amputation status, left 11/14/2017   Enlarged lymph node in neck 07/18/2017   Malignant melanoma of face ([place]) 04/16/2017   Osteomyelitis ([place]) 03/25/2017   Skin cancer 03/25/2017    Depression 11/05/2016   Diabetic ulcer of toe of left foot ([place]) 10/02/2016   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   Chronic pain 09/27/2016   DM2 (diabetes mellitus, type 2) ([place]) 06/06/2016   GERD without esophagitis 05/11/2016   Hypertensive heart disease with congestive heart failure ([place]) 12/15/2015   Spinal stenosis in cervical region 12/15/2015   Type II diabetes mellitus with neurological manifestations ([place]) 12/15/2015   Chronic diastolic HF (heart failure) (HCC)    Pulmonary hypertension (HCC)    HTN (hypertension)    HLD (hyperlipidemia)    Urinary retention    Diskitis    Foot drop, bilateral    Sepsis with acute renal failure without septic shock (HCC)    COPD (chronic obstructive pulmonary disease) ([place]) 10/03/2015   Acute osteomyelitis of left foot ([place]) 10/03/2015   MRSA bacteremia 08/13/2015   Chronic low back pain 08/13/2015   Chronic multifocal osteomyelitis, multiple sites ([place]) 08/13/2015   Community acquired pneumonia 08/13/2015   PCP:  [name], MD Pharmacy:  No Pharmacies Listed    Social Determinants of Health (SDOH) Interventions Food Insecurity Interventions: Intervention Not Indicated Housing Interventions: Intervention Not Indicated Transportation Interventions: Intervention Not Indicated Utilities Interventions: Intervention Not Indicated  Readmission Risk Interventions    01/31/2022    2:06 PM 01/25/2022    3:01 PM 07/03/2021    2:54 PM  Readmission Risk Prevention Plan  Transportation Screening Complete  Complete  Medication Review (RN Care Manager) Complete Complete Referral to Pharmacy  PCP or Specialist appointment within 3-5 days of discharge Complete Complete Complete  HRI or Home Care Consult Complete Complete Complete  SW Recovery Care/Counseling Consult Complete Complete Complete  Palliative Care Screening Not Applicable Not Applicable Not Applicable  Skilled Nursing Facility Complete Complete Complete

## 2022-05-02 NOTE — Inpatient Diabetes Management (Signed)
Inpatient Diabetes Program Recommendations  AACE/ADA: New Consensus Statement on Inpatient Glycemic Control (2015)  Target Ranges:  Prepandial:   less than 140 mg/dL      Peak postprandial:   less than 180 mg/dL (1-2 hours)      Critically ill patients:  140 - 180 mg/dL   Lab Results  Component Value Date   GLUCAP 209 (H) 05/02/2022   HGBA1C 6.9 (H) 05/01/2022    Review of Glycemic Control  Latest Reference Range & Units 05/01/22 20:07 05/02/22 07:37 05/02/22 12:52  Glucose-Capillary 70 - 99 mg/dL 339 (H) 281 (H) 209 (H)  (H): Data is abnormally high Diabetes history: Type 2 DM Outpatient Diabetes medications: Lantus 12 units BID, Humalog 0-56 units TID, Trulicity 9.79 mg qwk Current orders for Inpatient glycemic control: Trulicity 4.80 mg qwk, Novolog 0-15 units TID & HS Decadron 10 mg x 1  Inpatient Diabetes Program Recommendations:    Consider adding Semglee 12 units QD.   Thanks, Bronson Curb, MSN, RNC-OB Diabetes Coordinator [phone] (8a-5p)

## 2022-05-02 NOTE — NC FL2 (Signed)
[place] LEVEL OF CARE SCREENING TOOL     IDENTIFICATION  Patient Name: Gregory [name] Birthdate: 09-04-1960 Sex: male Admission Date (Current Location): 05/01/2022  [facility] and [place] Number:  Herbalist and Address:  [facility],  [place] [address] [place]      Provider Number:    Attending Physician Name and Address:  Robley Fries, MD  Relative Name and Phone Number:   Joellen Jersey [phone])    Current Level of Care: Hospital Recommended Level of Care: Nursing Facility Prior Approval Number:    Date Approved/Denied:   PASRR Number:    Discharge Plan: Other (Comment) [facility])    Current Diagnoses: Patient Active Problem List   Diagnosis Date Noted   Pneumonia 05/01/2022   Right upper lobe pneumonia 09/05/2021   Hyponatremia 09/05/2021   Anxiety 09/05/2021   BPH (benign prostatic hyperplasia) 09/05/2021   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   Asymptomatic bacteriuria 06/30/2021   Normocytic anemia 06/29/2021   Pain of amputation stump of right lower extremity ([place]) 03/15/2021   Melanoma of cheek ([place]) 10/15/2019   Acute hypoxic respiratory failure ([place]) 07/11/2019   Acute respiratory disease due to COVID-19 virus 07/11/2019   Wound infection 06/22/2019   Abscess 06/22/2019   Stage 3b chronic kidney disease (CKD) ([place]) 05/23/2019   Tobacco use 05/23/2019   Sepsis ([place]) 05/23/2019   S/P AKA (above knee amputation) unilateral, right ([place]) 12/05/2018   Amputation stump infection ([place])    Lymphedema of left leg    Diabetic polyneuropathy associated with type 2 diabetes mellitus ([place])    Above knee amputation of right lower extremity ([place]) 10/22/2018   Dehiscence of amputation stump ([place])    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[place]) 08/12/2018   Candidiasis 08/12/2018   Necrosis of toe ([place]) 07/08/2018   Amputated toe of left foot ([place]) 06/18/2018   Streptococcal infection     Pressure injury, stage 3 ([place]) 06/06/2018   Cutaneous abscess of left foot    Moderate protein-calorie malnutrition ([place])    Septic arthritis ([place]) 06/03/2018   Idiopathic chronic venous hypertension of left lower extremity with ulcer and inflammation ([place]) 11/14/2017   Great toe amputation status, left 11/14/2017   Enlarged lymph node in neck 07/18/2017   Malignant melanoma of face ([place]) 04/16/2017   Osteomyelitis ([place]) 03/25/2017   Skin cancer 03/25/2017   Depression 11/05/2016   Diabetic ulcer of toe of left foot ([place]) 10/02/2016   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   Chronic pain 09/27/2016   DM2 (diabetes mellitus, type 2) ([place]) 06/06/2016   GERD without esophagitis 05/11/2016   Hypertensive heart disease with congestive heart failure ([place]) 12/15/2015   Spinal stenosis in cervical region 12/15/2015   Type II diabetes mellitus with neurological manifestations ([place]) 12/15/2015   Chronic diastolic HF (heart failure) (HCC)    Pulmonary hypertension (HCC)    HTN (hypertension)    HLD (hyperlipidemia)    Urinary retention    Diskitis    Foot drop, bilateral    Sepsis with acute renal failure without septic shock (HCC)    COPD (chronic obstructive pulmonary disease) ([place]) 10/03/2015   Acute osteomyelitis of left foot ([place]) 10/03/2015   MRSA bacteremia 08/13/2015   Chronic low back pain 08/13/2015   Chronic multifocal osteomyelitis, multiple sites ([place]) 08/13/2015   Community acquired pneumonia 08/13/2015    Orientation RESPIRATION BLADDER Height & Weight     Self, Time, Situation, Place  O2 Indwelling catheter  Weight: 111.1 kg Height:  6' (182.9 cm)  BEHAVIORAL SYMPTOMS/MOOD NEUROLOGICAL BOWEL NUTRITION STATUS      Continent Diet (CHO MOD)  AMBULATORY STATUS COMMUNICATION OF NEEDS Skin   Total Care Verbally Normal                       Personal Care Assistance Level of Assistance  Bathing, Feeding, Dressing Bathing Assistance: Maximum assistance Feeding assistance:  Limited assistance Dressing Assistance: Maximum assistance     Functional Limitations Info  Sight, Hearing, Speech Sight Info: Impaired Hearing Info: Adequate Speech Info: Impaired (Partial dentures)    SPECIAL CARE FACTORS FREQUENCY                       Contractures Contractures Info: Not present    Additional Factors Info  Code Status, Allergies, Insulin Sliding Scale Code Status Info:  (Full) Allergies Info:  (NKA)   Insulin Sliding Scale Info:  (SSI)       Current Medications (05/02/2022):  This is the current hospital active medication list Current Facility-Administered Medications  Medication Dose Route Frequency Provider Last Rate Last Admin   0.9 %  sodium chloride infusion  250 mL Intravenous PRN Robley Fries, MD       acetaminophen (TYLENOL) tablet 650 mg  650 mg Oral Q4H PRN Jacalyn Lefevre D, MD       albuterol (PROVENTIL) (2.5 MG/3ML) 0.083% nebulizer solution 2.5 mg  2.5 mg Nebulization BID Jacalyn Lefevre D, MD   2.5 mg at 05/02/22 3818   azithromycin (ZITHROMAX) 500 mg in sodium chloride 0.9 % 250 mL IVPB  500 mg Intravenous Q24H Kyle, Tyrone A, DO   Stopped at 05/01/22 1941   cefTRIAXone (ROCEPHIN) 1 g in sodium chloride 0.9 % 100 mL IVPB  1 g Intravenous Q24H Kyle, Tyrone A, DO       Chlorhexidine Gluconate Cloth 2 % PADS 6 each  6 each Topical Daily Jacalyn Lefevre D, MD   6 each at 05/02/22 2993   cyanocobalamin (VITAMIN B12) tablet 1,000 mcg  1,000 mcg Oral Daily Jacalyn Lefevre D, MD   1,000 mcg at 05/02/22 7169   diclofenac Sodium (VOLTAREN) 1 % topical gel 4 g  4 g Topical Q8H PRN Jacalyn Lefevre D, MD       docusate sodium (COLACE) capsule 100 mg  100 mg Oral BID Jacalyn Lefevre D, MD   100 mg at 05/01/22 2131   Dulaglutide SOPN 0.75 mg  0.75 mg Subcutaneous Q Wed Pace, Dover D, MD       ferrous sulfate tablet 325 mg  325 mg Oral Q breakfast Jacalyn Lefevre D, MD   325 mg at 05/02/22 0921   fluticasone (FLONASE) 50 MCG/ACT nasal spray 1  spray  1 spray Each Nare Daily Jacalyn Lefevre D, MD   1 spray at 05/02/22 0925   furosemide (LASIX) tablet 20 mg  20 mg Oral q1600 Jacalyn Lefevre D, MD       furosemide (LASIX) tablet 40 mg  40 mg Oral Daily Kyle, Tyrone A, DO   40 mg at 05/02/22 6789   gabapentin (NEURONTIN) capsule 300 mg  300 mg Oral TID Jacalyn Lefevre D, MD   300 mg at 05/02/22 0921   glucagon (human recombinant) (GLUCAGEN) injection 1 mg  1 mg Intramuscular Once PRN Jacalyn Lefevre D, MD       guaiFENesin (ROBITUSSIN) 100 MG/5ML liquid 5 mL  5 mL Oral Q4H PRN Marylyn Ishihara,  Tyrone A, DO       guaiFENesin tablet 200 mg  200 mg Oral Q6H PRN Jacalyn Lefevre D, MD       hydrOXYzine (ATARAX) tablet 25 mg  25 mg Oral Q6H PRN Jacalyn Lefevre D, MD   25 mg at 05/02/22 1223   insulin aspart (novoLOG) injection 0-15 Units  0-15 Units Subcutaneous TID WC Kyle, Tyrone A, DO   8 Units at 05/02/22 0920   insulin aspart (novoLOG) injection 0-5 Units  0-5 Units Subcutaneous QHS Kyle, Tyrone A, DO   5 Units at 05/01/22 2040   ipratropium-albuterol (DUONEB) 0.5-2.5 (3) MG/3ML nebulizer solution 3 mL  3 mL Nebulization Q6H PRN Jacalyn Lefevre D, MD       lactulose (CHRONULAC) 10 GM/15ML solution 10 g  10 g Oral TID Jacalyn Lefevre D, MD   10 g at 05/02/22 0919   levalbuterol (XOPENEX) nebulizer solution 0.63 mg  0.63 mg Nebulization Q6H PRN Pace, Simone Curia D, MD       lidocaine (LIDODERM) 5 % 2 patch  2 patch Transdermal Q24H Jacalyn Lefevre D, MD       loratadine (CLARITIN) tablet 10 mg  10 mg Oral Daily Jacalyn Lefevre D, MD   10 mg at 05/02/22 0920   magnesium oxide (MAG-OX) tablet 400 mg  400 mg Oral BID Jacalyn Lefevre D, MD   400 mg at 05/02/22 2536   melatonin tablet 3 mg  3 mg Oral QHS Jacalyn Lefevre D, MD   3 mg at 05/01/22 2129   metoprolol tartrate (LOPRESSOR) tablet 12.5 mg  12.5 mg Oral BID Jacalyn Lefevre D, MD   12.5 mg at 05/02/22 0921   mirabegron ER (MYRBETRIQ) tablet 50 mg  50 mg Oral Daily Jacalyn Lefevre D, MD   50 mg at 05/02/22  0930   morphine (PF) 2 MG/ML injection 2-4 mg  2-4 mg Intravenous Q2H PRN Jacalyn Lefevre D, MD   4 mg at 05/02/22 1223   multivitamin with minerals tablet 1 tablet  1 tablet Oral Daily Jacalyn Lefevre D, MD   1 tablet at 05/02/22 6440   Muscle Rub CREA   Topical Q8H PRN Jacalyn Lefevre D, MD   Given at 05/02/22 0923   naloxone (NARCAN) nasal spray 4 mg/0.1 mL  1 spray Nasal PRN Robley Fries, MD       neomycin-bacitracin-polymyxin [phone] OINT 1 Application  1 Application Topical TID PRN Jacalyn Lefevre D, MD       nicotine (NICODERM CQ - dosed in mg/24 hr) patch 7 mg  7 mg Transdermal Daily Kathryne Eriksson, NP   7 mg at 05/01/22 2139   nystatin cream (MYCOSTATIN) 1 Application  1 Application Topical Daily Jacalyn Lefevre D, MD       ondansetron (ZOFRAN) injection 4 mg  4 mg Intravenous Q4H PRN Jacalyn Lefevre D, MD       ondansetron (ZOFRAN) tablet 4 mg  4 mg Oral Q8H PRN Jacalyn Lefevre D, MD       oxybutynin (DITROPAN-XL) 24 hr tablet 10 mg  10 mg Oral Daily Jacalyn Lefevre D, MD   10 mg at 05/02/22 3875   oxyCODONE (Oxy IR/ROXICODONE) immediate release tablet 30 mg  30 mg Oral Q8H Jacalyn Lefevre D, MD   30 mg at 05/02/22 0920   oxyCODONE (Oxy IR/ROXICODONE) immediate release tablet 5 mg  5 mg Oral Q8H PRN Jacalyn Lefevre D, MD   5 mg at 05/02/22 0601   pantoprazole (PROTONIX) EC tablet 40  mg  40 mg Oral Daily Jacalyn Lefevre D, MD   40 mg at 05/02/22 0920   phenazopyridine (PYRIDIUM) tablet 100 mg  100 mg Oral Q8H PRN Jacalyn Lefevre D, MD   100 mg at 05/01/22 2302   polyvinyl alcohol (LIQUIFILM TEARS) 1.4 % ophthalmic solution 2 drop  2 drop Both Eyes Q4H PRN Jacalyn Lefevre D, MD       pregabalin (LYRICA) capsule 150 mg  150 mg Oral BID Jacalyn Lefevre D, MD   150 mg at 05/02/22 2800   promethazine (PHENERGAN) tablet 12.5 mg  12.5 mg Oral Q6H PRN Jacalyn Lefevre D, MD       rOPINIRole (REQUIP) tablet 2 mg  2 mg Oral BID Jacalyn Lefevre D, MD   2 mg at 05/02/22 3491   rosuvastatin  (CRESTOR) tablet 10 mg  10 mg Oral QHS Kyle, Tyrone A, DO   10 mg at 05/01/22 2133   senna (SENOKOT) tablet 8.6 mg  1 tablet Oral BID Jacalyn Lefevre D, MD   8.6 mg at 05/01/22 2131   sodium chloride flush (NS) 0.9 % injection 3 mL  3 mL Intravenous Q12H Jacalyn Lefevre D, MD   3 mL at 05/02/22 0924   sodium chloride flush (NS) 0.9 % injection 3 mL  3 mL Intravenous PRN Jacalyn Lefevre D, MD       tamsulosin (FLOMAX) capsule 0.8 mg  0.8 mg Oral Daily Jacalyn Lefevre D, MD   0.8 mg at 05/02/22 7915   tiZANidine (ZANAFLEX) tablet 4 mg  4 mg Oral Q6H PRN Jacalyn Lefevre D, MD   4 mg at 05/02/22 1223   triamcinolone cream (KENALOG) 0.1 % cream 1 Application  1 Application Topical BID Jacalyn Lefevre D, MD       umeclidinium bromide (INCRUSE ELLIPTA) 62.5 MCG/ACT 1 puff  1 puff Inhalation Daily Jacalyn Lefevre D, MD   1 puff at 05/02/22 0627   Urelle (URELLE/URISED) 81 MG tablet 81 mg  1 tablet Oral Q8H PRN Jacalyn Lefevre D, MD       zolpidem (AMBIEN) tablet 5 mg  5 mg Oral QHS PRN,MR X 1 Jacalyn Lefevre D, MD   5 mg at 05/01/22 2139     Discharge Medications: Please see discharge summary for a list of discharge medications.  Relevant Imaging Results:  Relevant Lab Results:   Additional Information SS# [id number];X BKA  Keron Koffman, Juliann Pulse, RN

## 2022-05-02 NOTE — Progress Notes (Signed)
Josh with [facility] phoned to notify that we discontinued telemetry due to patient not having an order for it.

## 2022-05-03 ENCOUNTER — Ambulatory Visit: Payer: Medicaid Other | Admitting: Cardiovascular Disease

## 2022-05-03 DIAGNOSIS — J9601 Acute respiratory failure with hypoxia: Secondary | ICD-10-CM | POA: Diagnosis not present

## 2022-05-03 LAB — CBC
HCT: 36.9 % — ABNORMAL LOW (ref 39.0–52.0)
Hemoglobin: 10.7 g/dL — ABNORMAL LOW (ref 13.0–17.0)
MCH: 27 pg (ref 26.0–34.0)
MCHC: 29 g/dL — ABNORMAL LOW (ref 30.0–36.0)
MCV: 92.9 fL (ref 80.0–100.0)
Platelets: 196 10*3/uL (ref 150–400)
RBC: 3.97 MIL/uL — ABNORMAL LOW (ref 4.22–5.81)
RDW: 15.2 % (ref 11.5–15.5)
WBC: 8.9 10*3/uL (ref 4.0–10.5)
nRBC: 0 % (ref 0.0–0.2)

## 2022-05-03 LAB — MAGNESIUM: Magnesium: 2.1 mg/dL (ref 1.7–2.4)

## 2022-05-03 LAB — BASIC METABOLIC PANEL
Anion gap: 10 (ref 5–15)
BUN: 31 mg/dL — ABNORMAL HIGH (ref 8–23)
CO2: 24 mmol/L (ref 22–32)
Calcium: 9.4 mg/dL (ref 8.9–10.3)
Chloride: 102 mmol/L (ref 98–111)
Creatinine, Ser: 1.81 mg/dL — ABNORMAL HIGH (ref 0.61–1.24)
GFR, Estimated: 42 mL/min — ABNORMAL LOW (ref >60)
Glucose, Bld: 223 mg/dL — ABNORMAL HIGH (ref 70–99)
Potassium: 4.1 mmol/L (ref 3.5–5.1)
Sodium: 136 mmol/L (ref 135–145)

## 2022-05-03 LAB — GLUCOSE, CAPILLARY
Glucose-Capillary: 259 mg/dL — ABNORMAL HIGH (ref 70–99)
Glucose-Capillary: 234 mg/dL — ABNORMAL HIGH (ref 70–99)
Glucose-Capillary: 349 mg/dL — ABNORMAL HIGH (ref 70–99)
Glucose-Capillary: 363 mg/dL — ABNORMAL HIGH (ref 70–99)

## 2022-05-03 LAB — STREP PNEUMONIAE URINARY ANTIGEN: Strep Pneumo Urinary Antigen: NEGATIVE

## 2022-05-03 LAB — PHOSPHORUS: Phosphorus: 4.1 mg/dL (ref 2.5–4.6)

## 2022-05-03 MED ORDER — OXYMETAZOLINE HCL 0.05 % NA SOLN
2.0000 | Freq: Two times a day (BID) | NASAL | Status: AC | PRN
  Administered 2022-05-03: 2 via NASAL
  Filled 2022-05-03: qty 15

## 2022-05-03 MED ORDER — NICOTINE 21 MG/24HR TD PT24
21.0000 mg | MEDICATED_PATCH | Freq: Every day | TRANSDERMAL | Status: DC
  Administered 2022-05-03 – 2022-05-04 (×2): 21 mg via TRANSDERMAL
  Filled 2022-05-03 (×2): qty 1

## 2022-05-03 MED ORDER — ALPRAZOLAM 0.25 MG PO TABS
0.2500 mg | ORAL_TABLET | Freq: Three times a day (TID) | ORAL | Status: DC | PRN
  Administered 2022-05-03 – 2022-05-04 (×4): 0.25 mg via ORAL
  Filled 2022-05-03 (×4): qty 1

## 2022-05-03 NOTE — Progress Notes (Signed)
PROGRESS NOTE  Gregory [name]  DOB March 23, 1961  PCP [name], MD [id number]  DOA 05/01/2022  LOS: 1 day  Hospital Day: 3  Brief narrative Gregory [name] is a 61 y.o. male with PMH significant for DM2, HTN, HLD, diastolic CHF, COPD, CKD, chronic anemia, diabetic foot ulcer who was following up with urology for ureteral stricture.   11/14, patient underwent elective cystoscopy, prostatic urethral biopsy and urethral dilatation. Postprocedure patient became hypoxic and required up to 6 L of supplemental oxygen.  Patient also reported shortness of breath and productive cough for 2 days. Hospitalist service was consulted.   Chest x-ray showed right upper lobe pneumonia  Started on broad-spectrum antibiotics Admitted to [facility].  Subjective Patient was seen and examined this afternoon.  Middle-aged male.  Sitting up in bed.  On oxygen by nasal cannula.  Reports severe anxiety. Chart reviewed In the last 24 hours, no fever, heart rate in 50s and 60s, blood pressure in 130s this morning, on 3 L oxygen by nasal cannula Last set of labs from yesterday morning with BUN/creatinine elevated to 32/1.93, BNP elevated to 169, procalcitonin level normal, WBC count normal at 10.4, hemoglobin at 10.1.  Assessment and plan Acute hypoxic respiratory failure Right upper lobe pneumonia COPD exacerbation After an elective procedure, patient went into respiratory distress with hypoxia requiring 6 L oxygen.  Also reported shortness of breath and productive cough for 2 days.  Does not use supplemental oxygen at home.  Chest x-ray showed right upper lobe pneumonia. Currently on IV Rocephin and azithromycin. PTA on Spiriva daily, Xopenex as needed, Flonase.   Continue the same, Duoneb nebulization, Mucinex. Continue inspiratory spirometry and flutter valve. Continue supplemental oxygen and wean as tolerated. Negative Legionella and strep pneumo urinary antigen. Recent Labs  Lab 05/01/22 0815  05/02/22 1516 05/03/22 1101  WBC 10.8* 10.4 8.9  PROCALCITON  --  <0.10  --    Type 2 diabetes mellitus with hyperglycemia A1c 6.9 on 81/19/1478 PTA on Trulicity weekly Lantus 12 units daily, sliding scale insulin Currently blood sugar level is running over 200 consistently. Resume Semglee 12 units daily.  Continue sliding scale insulin with Accu-Cheks Recent Labs  Lab 05/02/22 1252 05/02/22 1709 05/02/22 2043 05/03/22 0803 05/03/22 1144  GLUCAP 209* 293* 240* 259* 234*   CKD stage IIIb Serum creatinine remains at baseline close to 2.  Continue to monitor Recent Labs    09/06/21 0740 09/08/21 0402 01/24/22 1454 01/25/22 1259 01/26/22 1000 01/27/22 0831 01/28/22 0549 05/01/22 0813 05/02/22 1516 05/03/22 1101  BUN 33* 28* 30* 33* 41* 36* 29* 37* 32* 31*  CREATININE 1.88* 1.60* 2.21* 2.18* 2.52* 2.02* 1.75* 2.07* 1.93* 1.81*   Chronic diastolic CHF Essential hypertension Chronic left lower extremity lymphedema Patient has significant chronic left lower EXTR lymphedema with stasis changes.  His right AKA status.  Wheelchair-bound at baseline.   PTA on metoprolol 12.5 mg daily, Lasix 40 mg a.m./20 mg p.m. Continue both. Blood pressure stable.  Heart failure appears compensated.  Electrolytes normal.   Hyperlipidemia Continue Crestor 10 mg daily  Chronic anemia Hemoglobin at baseline close to 10. Continue iron and vitamin B12 supplement Recent Labs    01/27/22 0831 01/28/22 0549 05/01/22 0815 05/02/22 1516 05/03/22 1101  HGB 9.6* 10.1* 10.7* 10.1* 10.7*  MCV 91.3 91.5 88.0 87.8 92.9   Urethral stricture S/p cystoscopy with prostatic urethral biopsy. PTA on Flomax 0.4 mg daily, Myrbetriq 50 mg daily, oxybutynin 10 mg daily. Continue the same. Patient also  seems to be on methenamine 1 g daily.  Will discuss with urology. Further management as per urology   GERD Prilosec  Diabetic neuropathy Gabapentin 3 mg 3 times daily as well as Lyrica 150 mg twice  daily,  Insomnia Continue Lunesta, melatonin 3 mg bedtime  Restless legs Continue Requip 2 mg twice daily  Chronic pain PTA on oxycodone 30 mg 3 times daily, Zanaflex 4 mg 4 times daily as needed,.  Continue the same Currently also on morphine IV as needed.  Constipation Bowel regimen with lactulose 10 mg 3 times daily scheduled, Senokot bedtime, MiraLAX as needed  Goals of care   Code Status: Full Code    Mobility : Right AKA status, wheelchair-bound  Skin assessment     Nutritional status  Body mass index is 33.23 kg/m.          Diet  Diet Order             Diet Carb Modified Fluid consistency: Thin; Room service appropriate? Yes  Diet effective now           Diet - low sodium heart healthy                   DVT prophylaxis  SCDs Start: 05/01/22 1543   Antimicrobials IV Rocephin Fluid none Consultants urology Family Communication none at bedside  Status is inpatient  Continue in-hospital care because remains on supplemental oxygen. Level of care Med-Surg   Dispo The patient is from long-term care              Anticipated d/c is to back to long-term care in 1 to 2 days              Patient currently is not medically stable to d/c.   Difficult to place patient No     Infusions   sodium chloride     azithromycin 500 mg (05/02/22 1747)   cefTRIAXone (ROCEPHIN)  IV 1 g (05/03/22 1554)    Scheduled Meds  Chlorhexidine Gluconate Cloth  6 each Topical Daily   cyanocobalamin  1,000 mcg Oral Daily   docusate sodium  100 mg Oral BID   ferrous sulfate  325 mg Oral Q breakfast   fluticasone  1 spray Each Nare Daily   furosemide  20 mg Oral q1600   furosemide  40 mg Oral Daily   gabapentin  300 mg Oral TID   insulin aspart  0-15 Units Subcutaneous TID WC   insulin aspart  0-5 Units Subcutaneous QHS   lactulose  10 g Oral TID   lidocaine  2 patch Transdermal Q24H   loratadine  10 mg Oral Daily   magnesium oxide  400 mg Oral BID   melatonin   3 mg Oral QHS   metoprolol tartrate  12.5 mg Oral BID   mirabegron ER  50 mg Oral Daily   multivitamin with minerals  1 tablet Oral Daily   nicotine  21 mg Transdermal Daily   nystatin cream  1 Application Topical Daily   oxybutynin  10 mg Oral Daily   oxycodone  30 mg Oral Q8H   pantoprazole  40 mg Oral Daily   pregabalin  150 mg Oral BID   rOPINIRole  2 mg Oral BID   rosuvastatin  10 mg Oral QHS   senna  1 tablet Oral BID   sodium chloride flush  3 mL Intravenous Q12H   tamsulosin  0.8 mg Oral Daily   triamcinolone cream  1 Application Topical BID   umeclidinium bromide  1 puff Inhalation Daily    PRN meds sodium chloride, acetaminophen, ALPRAZolam, diclofenac Sodium, glucagon (human recombinant), guaiFENesin, guaiFENesin, hydrOXYzine, ipratropium-albuterol, levalbuterol, morphine injection, Muscle Rub, naloxone, neomycin-bacitracin-polymyxin, ondansetron, ondansetron, oxyCODONE, oxymetazoline, phenazopyridine, polyvinyl alcohol, promethazine, sodium chloride flush, tiZANidine, Urelle, zolpidem   Antimicrobials Anti-infectives (From admission, onward)    Start     Dose/Rate Route Frequency Ordered Stop   05/01/22 1827  Urelle (URELLE/URISED) 81 MG tablet 81 mg        1 tablet Oral Every 8 hours PRN 05/01/22 1827     05/01/22 1600  azithromycin (ZITHROMAX) 500 mg in sodium chloride 0.9 % 250 mL IVPB        500 mg 250 mL/hr over 60 Minutes Intravenous Every 24 hours 05/01/22 1505     05/01/22 1600  cefTRIAXone (ROCEPHIN) 1 g in sodium chloride 0.9 % 100 mL IVPB        1 g 200 mL/hr over 30 Minutes Intravenous Every 24 hours 05/01/22 1505     05/01/22 1546  Uribel 118 MG CAPS 118 mg  Status:  Discontinued        1 capsule Oral Every 8 hours PRN 05/01/22 1547 05/01/22 1827   05/01/22 1150  ceFAZolin (ANCEF) 2-4 GM/100ML-% IVPB       Note to Pharmacy: Jasmine Pang K: cabinet override      05/01/22 1150 05/01/22 2359       Objective Vitals:   05/03/22 0751 05/03/22 1429   BP:  (!) 146/66  Pulse:  61  Resp:  19  Temp:  98 F (36.7 C)  SpO2: 100% 92%    Intake/Output Summary (Last 24 hours) at 05/03/2022 1652 Last data filed at 05/03/2022 1103 Gross per 24 hour  Intake 590 ml  Output 6650 ml  Net -6060 ml   Filed Weights   05/01/22 0716  Weight: 111.1 kg   Weight change:  Body mass index is 33.23 kg/m.   Physical Exam General exam pleasant, middle-aged obese male.  Not in distress.  Anxious Skin No rashes, lesions or ulcers. HEENT Atraumatic, normocephalic, no obvious bleeding Lungs clear to auscultation bilaterally CVS regular rate and rhythm, no murmur GI/Abd soft, nontender, distended from obesity, bowel sound present CNS alert, awake, oriented x3 Psychiatry anxious  Extremities right AKA status.  Left chronic lower extremity lymphedema with status changes  Data Review I have personally reviewed the laboratory data and studies available.  F/u labs ordered Unresulted Labs (From admission, onward)     Start     Ordered   05/02/22 0500  CBC with Differential/Platelet  Tomorrow morning,   R        05/01/22 2051   05/01/22 1516  Legionella Pneumophila Serogp 1 Ur Ag  Once,   R        05/01/22 1515   05/01/22 0747  CBC with Differential/Platelet  ONCE - STAT,   STAT        05/01/22 0747            Signed, Terrilee Croak, MD [facility] 05/03/2022

## 2022-05-04 DIAGNOSIS — J9601 Acute respiratory failure with hypoxia: Secondary | ICD-10-CM | POA: Diagnosis not present

## 2022-05-04 LAB — LEGIONELLA PNEUMOPHILA SEROGP 1 UR AG: L. pneumophila Serogp 1 Ur Ag: NEGATIVE

## 2022-05-04 LAB — GLUCOSE, CAPILLARY
Glucose-Capillary: 321 mg/dL — ABNORMAL HIGH (ref 70–99)
Glucose-Capillary: 232 mg/dL — ABNORMAL HIGH (ref 70–99)

## 2022-05-04 MED ORDER — SODIUM CHLORIDE 0.9 % IV SOLN: 2.0000 g | INTRAVENOUS | Status: DC

## 2022-05-04 MED ORDER — FUROSEMIDE 20 MG PO TABS: 40.0000 mg | ORAL_TABLET | Freq: Every day | ORAL | Status: DC

## 2022-05-04 MED ORDER — CEFDINIR 300 MG PO CAPS: 300.0000 mg | ORAL_CAPSULE | Freq: Two times a day (BID) | ORAL | 0 refills | Status: AC

## 2022-05-04 MED ORDER — FUROSEMIDE 20 MG PO TABS: 20.0000 mg | ORAL_TABLET | Freq: Every day | ORAL | Status: DC

## 2022-05-04 MED ORDER — INSULIN GLARGINE-YFGN 100 UNIT/ML ~~LOC~~ SOLN
20.0000 [IU] | Freq: Every day | SUBCUTANEOUS | Status: DC
  Administered 2022-05-04: 20 [IU] via SUBCUTANEOUS
  Filled 2022-05-04: qty 0.2

## 2022-05-04 MED ORDER — SACCHAROMYCES BOULARDII 250 MG PO CAPS: 250.0000 mg | ORAL_CAPSULE | Freq: Two times a day (BID) | ORAL | 0 refills | Status: AC

## 2022-05-04 MED ORDER — FUROSEMIDE 10 MG/ML IJ SOLN
40.0000 mg | Freq: Two times a day (BID) | INTRAMUSCULAR | Status: DC
  Administered 2022-05-04: 40 mg via INTRAVENOUS
  Filled 2022-05-04: qty 4

## 2022-05-04 NOTE — Progress Notes (Signed)
PT Cancellation Note  Patient Details Name: ISAC LINCKS MRN: [id number] DOB: [birth year]   Cancelled Treatment:    Reason Eval/Treat Not Completed: PT screened, no needs identified, will sign off Pt declines PT in hospital.  Pt from LTC and feels he will be able to continue using electric scooter.  PT to sign off as pt declines need.   Myrtis Hopping [place] 05/04/2022, 12:36 PM Jannette Spanner PT, DPT Physical Therapist Acute Rehabilitation Services Preferred contact method: Secure Chat Weekend Pager Only: [phone] Office: [phone]

## 2022-05-04 NOTE — TOC Transition Note (Addendum)
Transition of Care [facility]) - CM/SW Discharge Note   Patient Details  Name: Gregory [name] MRN: [id number] Date of Birth: 03/22/1961  Transition of Care [facility] CM/SW Contact:  Dessa Phi, RN Phone Number: 05/04/2022, 1:31 PM   Clinical Narrative:d/c back to [facility] f/c-LTC-d/c summary sent await rm#,tel# report prior PTAR.  -going to [facility] [address] bldg, report tel#[phone]. PT[facility] called. [facility] [address] rep Emily/Patricia-power w/c can be picked up on Monday by facility driver or patient can contact a relative/friend to pick up.  -3:30p-[facility] transport called for power w/c pick up(patient has onw0rider waiver in packet for transport to [address]. No 02 needed. No further CM needs.     Final next level of care: Long Term Nursing Home Barriers to Discharge: No Barriers Identified   Patient Goals and CMS Choice        Discharge Placement                       Discharge Plan and Services                                     Social Determinants of Health (SDOH) Interventions Food Insecurity Interventions: Intervention Not Indicated Housing Interventions: Intervention Not Indicated Transportation Interventions: Intervention Not Indicated Utilities Interventions: Intervention Not Indicated   Readmission Risk Interventions    05/03/2022   11:53 AM 01/31/2022    2:06 PM 01/25/2022    3:01 PM  Readmission Risk Prevention Plan  Transportation Screening Complete Complete   Medication Review Press photographer) Complete Complete Complete  PCP or Specialist appointment within 3-5 days of discharge Complete Complete Complete  HRI or Home Care Consult Complete Complete Complete  SW Recovery Care/Counseling Consult Complete Complete Complete  Palliative Care Screening Not Applicable Not Applicable Not Applicable  Skilled Nursing Facility Complete Complete Complete

## 2022-05-04 NOTE — Discharge Summary (Addendum)
Physician Discharge Summary  Gregory [name] JME:[id number] DOB: [birth year] DOA: 05/01/2022  PCP: Gregory Marble, MD  Admit date: 05/01/2022 Discharge date: 05/04/2022  Admitted From: Long-term nursing home Discharge disposition: Back to long-term nursing home  Recommendations at discharge:  Complete the course of antibiotics with 7 more days of oral Omnicef with probiotics Continue Foley catheter at discharge till next follow-up with urology.   Brief narrative Gregory [name] is a 61 y.o. male with PMH significant for DM2, HTN, HLD, diastolic CHF, COPD, CKD, chronic anemia, diabetic foot ulcer who was following up with urology for ureteral stricture.   11/14, patient underwent elective cystoscopy, prostatic urethral biopsy and urethral dilatation. Postprocedure patient became hypoxic and required up to 6 L of supplemental oxygen.  Patient also reported shortness of breath and productive cough for 2 days. Hospitalist service was consulted.   Chest x-ray showed right upper lobe pneumonia  Started on broad-spectrum antibiotics Admitted to [facility].  Subjective Patient was seen and examined this morning.  Sitting up in bed.  On 1 L oxygen by nasal cannula.  O2 sat 99%.  But wants to hold onto nasal cannula.  Anxiety seems to be his issue.  On multiple mood altering medications prior to admission.  Given 1 dose of IV Lasix this morning after which he felt more better. Feels ready for discharge today.  Hospital course Acute hypoxic respiratory failure Right upper lobe pneumonia COPD exacerbation After an elective procedure, patient went into respiratory distress with hypoxia requiring 6 L oxygen.  Later on, patient reported shortness of breath and productive cough for 2 days at the facility.  Was not using supplemental oxygen prior to admission. Chest x-ray showed right upper lobe pneumonia. Negative Legionella and strep pneumo urinary antigen. Currently improving on IV Rocephin and  azithromycin.  Completed 3 days of azithromycin.  Discharge on 7 more days of Omnicef with probiotics PTA on Spiriva daily, Xopenex as needed, Flonase.   Resume the same.  Continue Mucinex. Continue inspiratory spirometry and flutter valve. This morning he was on 2 L oxygen by nasal cannula and was maintaining oxygen saturation at 98%.  He was gradually weaned down and currently maintaining oxygen saturation on room air.  No need of supplemental oxygen at discharge Recent Labs  Lab 05/01/22 0815 05/02/22 1516 05/03/22 1101  WBC 10.8* 10.4 8.9  PROCALCITON  --  <0.10  --    Type 2 diabetes mellitus with hyperglycemia A1c 6.9 on 22/97/9892 PTA on Trulicity weekly Lantus 12 units twice daily daily, sliding scale insulin.  While in the hospital, he was getting lower insulin than his normal requirement.  Resume previous regimen post discharge. Recent Labs  Lab 05/03/22 1144 05/03/22 1700 05/03/22 2310 05/04/22 0754 05/04/22 1318  GLUCAP 234* 349* 363* 321* 232*   CKD stage IIIb Serum creatinine remains at baseline close to 2.   Recent Labs    09/06/21 0740 09/08/21 0402 01/24/22 1454 01/25/22 1259 01/26/22 1000 01/27/22 0831 01/28/22 0549 05/01/22 0813 05/02/22 1516 05/03/22 1101  BUN 33* 28* 30* 33* 41* 36* 29* 37* 32* 31*  CREATININE 1.88* 1.60* 2.21* 2.18* 2.52* 2.02* 1.75* 2.07* 1.93* 1.81*   Chronic diastolic CHF Essential hypertension Chronic left lower extremity lymphedema Patient has significant chronic left lower EXTR lymphedema with stasis changes.  His right AKA status.  Wheelchair-bound at baseline.   PTA on metoprolol 12.5 mg daily, Lasix 40 mg a.m./20 mg p.m. Continue both. Blood pressure stable.  Heart failure appears compensated.  Electrolytes normal.   Hyperlipidemia Continue Crestor 10 mg daily  Chronic anemia Hemoglobin at baseline close to 10. Continue iron and vitamin B12 supplement Recent Labs    01/27/22 0831 01/28/22 0549 05/01/22 0815  05/02/22 1516 05/03/22 1101  HGB 9.6* 10.1* 10.7* 10.1* 10.7*  MCV 91.3 91.5 88.0 87.8 92.9   Urethral stricture S/p cystoscopy with prostatic urethral biopsy. PTA on Flomax 0.4 mg daily, Myrbetriq 50 mg daily, oxybutynin 10 mg daily, methenamine 1 g daily.   Urology consult appreciated.  Recommended to discharge with Foley catheter until next follow-up with urology.   GERD Prilosec  Diabetic neuropathy PTA on gabapentin 3 mg 3 times daily as well as Lyrica 150 mg twice daily,  Insomnia PTA on Lunesta, melatonin 3 mg bedtime  Restless legs PTA on Requip 2 mg twice daily  Chronic pain PTA on oxycodone 30 mg 3 times daily, Zanaflex 4 mg 4 times daily as needed,.  Continue the same Currently also on morphine IV as needed.  Constipation Bowel regimen with lactulose 10 mg 3 times daily scheduled, Senokot bedtime, MiraLAX as needed  Wounds:  - Wound / Incision (Open or Dehisced) 06/26/21 Non-pressure wound Thigh Left;Posterior;Proximal (Active)  Date First Assessed/Time First Assessed: 06/26/21 0445   Wound Type: Non-pressure wound  Location: Thigh  Location Orientation: Left;Posterior;Proximal  Present on Admission: Yes    Assessments 06/26/2021  7:39 AM 07/05/2021  8:24 PM  Dressing Type Foam - Lift dressing to assess site every shift --  Dressing Status Clean;Dry;Intact Clean;Intact;Dry  Dressing Change Frequency Every 3 days Daily  Site / Wound Assessment Dressing in place / Unable to assess Dressing in place / Unable to assess  Peri-wound Assessment Intact Intact  Drainage Amount None --     No associated orders.     Wound / Incision (Open or Dehisced) 09/05/21 Non-pressure wound Thigh Left;Posterior pink (Active)  Date First Assessed/Time First Assessed: 09/05/21 1048   Wound Type: Non-pressure wound  Location: Thigh  Location Orientation: Left;Posterior  Wound Description (Comments): pink  Present on Admission: Yes    Assessments 09/06/2021 11:50 PM 09/08/2021  8:00 AM   Dressing Type Foam - Lift dressing to assess site every shift Foam - Lift dressing to assess site every shift  Dressing Changed -- New  Dressing Status Clean, Dry, Intact Clean, Dry, Intact     No associated orders.     Wound / Incision (Open or Dehisced) 09/05/21 Non-pressure wound Thigh Left;Posterior pink (Active)  Date First Assessed/Time First Assessed: 09/05/21 1048   Wound Type: Non-pressure wound  Location: Thigh  Location Orientation: Left;Posterior  Wound Description (Comments): pink  Present on Admission: Yes    Assessments 09/06/2021 11:50 PM 09/08/2021  8:00 AM  Dressing Type Foam - Lift dressing to assess site every shift Foam - Lift dressing to assess site every shift  Dressing Changed -- Changed  Dressing Status Clean, Dry, Intact Clean, Dry, Intact     No associated orders.     Pressure Injury Buttocks Right Deep Tissue Pressure Injury - Purple or maroon localized area of discolored intact skin or blood-filled blister due to damage of underlying soft tissue from pressure and/or shear. (Active)  No Date First Assessed or Time First Assessed found.   Location: Buttocks  Location Orientation: Right  Staging: Deep Tissue Pressure Injury - Purple or maroon localized area of discolored intact skin or blood-filled blister due to damage of underlyin...    Assessments 09/06/2021 11:50 PM 09/08/2021  8:00 AM  Dressing  Type Foam - Lift dressing to assess site every shift Foam - Lift dressing to assess site every shift  Dressing Clean, Dry, Intact Clean, Dry, Intact     No associated orders.     Wound / Incision (Open or Dehisced) 09/05/21 Non-pressure wound Thigh Left;Posterior pink (Active)  Date First Assessed/Time First Assessed: 09/05/21 1048   Wound Type: Non-pressure wound  Location: Thigh  Location Orientation: Left;Posterior  Wound Description (Comments): pink  Present on Admission: Yes    Assessments 09/06/2021 11:50 PM 09/08/2021  8:00 AM  Dressing Type Foam - Lift dressing  to assess site every shift Foam - Lift dressing to assess site every shift  Dressing Changed -- Changed  Dressing Status Clean, Dry, Intact Clean, Dry, Intact     No associated orders.    Discharge Exam:   Vitals:   05/04/22 1118 05/04/22 1120 05/04/22 1413 05/04/22 1523  BP:   (!) 161/66   Pulse: 63 63 65 71  Resp:   19   Temp:   97.8 F (36.6 C)   TempSrc:   Oral   SpO2: 100% 97% 92% 94%  Weight:      Height:        Body mass index is 33.23 kg/m.  General exam pleasant, middle-aged obese male.  Not in distress.  Foley catheter in place. Skin No rashes, lesions or ulcers. HEENT Atraumatic, normocephalic, no obvious bleeding Lungs clear to auscultation bilaterally CVS regular rate and rhythm, no murmur GI/Abd soft, nontender, distended from obesity, bowel sound present CNS alert, awake, oriented x3 Psychiatry anxious  Extremities right AKA status.  Improving left chronic lower extremity lymphedema with status changes  Follow ups:    Contact information for follow-up providers     [facility] Follow up on 05/09/2022.   Why: 10:30 Contact information: [place]        Gregory Marble, MD Follow up.   Specialty: Internal Medicine Contact information: [place] [id number] [phone]              Contact information for after-discharge care     Destination     [facility] Preferred SNF .   Service: Skilled Nursing Contact information: [address] [place] [phone]                     Discharge Instructions:   Discharge Instructions     Call MD for:  difficulty breathing, headache or visual disturbances   Complete by: As directed    Call MD for:  extreme fatigue   Complete by: As directed    Call MD for:  hives   Complete by: As directed    Call MD for:  persistant dizziness or  light-headedness   Complete by: As directed    Call MD for:  persistant nausea and vomiting   Complete by: As directed    Call MD for:  severe uncontrolled pain   Complete by: As directed    Call MD for:  temperature >100.4   Complete by: As directed    Diet - low sodium heart healthy   Complete by: As directed    Diet - low sodium heart healthy   Complete by: As directed    Diet Carb Modified   Complete by: As directed    Discharge instructions   Complete by: As directed    Recommendations at  discharge:   Complete the course of antibiotics with 7 more days of oral Omnicef with probiotics  Continue Foley catheter at discharge till next follow-up with urology.  Discharge instructions for diabetes mellitus: Check blood sugar 3 times a day and bedtime at home. If blood sugar running above 200 or less than 70 please call your MD to adjust insulin. If you notice signs and symptoms of hypoglycemia (low blood sugar) like jitteriness, confusion, thirst, tremor and sweating, please check blood sugar, drink sugary drink/biscuits/sweets to increase sugar level and call MD or return to ER.    Discharge instructions for CHF Check weight daily -preferably same time every day. Restrict fluid intake to 1200 ml daily Restrict salt intake to less than 2 g daily. Call MD if you have one of the following symptoms 1) 3 pound weight gain in 24 hours or 5 pounds in 1 week  2) swelling in the hands, feet or stomach  3) progressive shortness of breath 4) if you have to sleep on extra pillows at night in order to breathe     General discharge instructions: Follow with Primary MD Gregory Marble, MD in 7 days  Please request your PCP  to go over your hospital tests, procedures, radiology results at the follow up. Please get your medicines reviewed and adjusted.  Your PCP may decide to repeat certain labs or tests as needed. Do not drive, operate heavy machinery, perform activities at heights,  swimming or participation in water activities or provide baby sitting services if your were admitted for syncope or siezures until you have seen by Primary MD or a Neurologist and advised to do so again. [place] Controlled Substance Reporting System database was reviewed. Do not drive, operate heavy machinery, perform activities at heights, swim, participate in water activities or provide baby-sitting services while on medications for pain, sleep and mood until your outpatient physician has reevaluated you and advised to do so again.  You are strongly recommended to comply with the dose, frequency and duration of prescribed medications. Activity: As tolerated with Full fall precautions use walker/cane & assistance as needed Avoid using any recreational substances like cigarette, tobacco, alcohol, or non-prescribed drug. If you experience worsening of your admission symptoms, develop shortness of breath, life threatening emergency, suicidal or homicidal thoughts you must seek medical attention immediately by calling 911 or calling your MD immediately  if symptoms less severe. You must read complete instructions/literature along with all the possible adverse reactions/side effects for all the medicines you take and that have been prescribed to you. Take any new medicine only after you have completely understood and accepted all the possible adverse reactions/side effects.  Wear Seat belts while driving. You were cared for by a hospitalist during your hospital stay. If you have any questions about your discharge medications or the care you received while you were in the hospital after you are discharged, you can call the unit and ask to speak with the hospitalist or the covering physician. Once you are discharged, your primary care physician will handle any further medical issues. Please note that NO REFILLS for any discharge medications will be authorized once you are discharged, as it is imperative that  you return to your primary care physician (or establish a relationship with a primary care physician if you do not have one).   Increase activity slowly   Complete by: As directed    Increase activity slowly   Complete by: As directed  Discharge Medications:   Allergies as of 05/04/2022   No Known Allergies      Medication List     TAKE these medications    Afrin 12 Hour 0.05 % nasal spray Generic drug: oxymetazoline Place 1 spray into both nostrils 2 (two) times daily as needed for congestion.   Biofreeze 4 % Gel Generic drug: Menthol (Topical Analgesic) Apply 1 Application topically every 8 (eight) hours as needed (for pain). Apply to right leg/stump   cefdinir 300 MG capsule Commonly known as: OMNICEF Take 1 capsule (300 mg total) by mouth 2 (two) times daily for 7 days.   cetirizine 10 MG tablet Commonly known as: ZYRTEC Take 10 mg by mouth daily.   cyanocobalamin 1000 MCG tablet Take 1,000 mcg by mouth daily.   Dermacloud Oint Apply 1 Application topically in the morning and at bedtime. To buttocks   diclofenac Sodium 1 % Gel Commonly known as: VOLTAREN Apply 4 g topically every 8 (eight) hours as needed (for pain). On right leg   docusate sodium 100 MG capsule Commonly known as: COLACE Take 1 capsule (100 mg total) by mouth 2 (two) times daily.   eszopiclone 2 MG Tabs tablet Commonly known as: LUNESTA Take 2 mg by mouth at bedtime. Take immediately before bedtime   ferrous sulfate 325 (65 FE) MG tablet Take 1 tablet (325 mg total) by mouth daily with breakfast.   fluticasone 50 MCG/ACT nasal spray Commonly known as: FLONASE Place 1 spray into both nostrils daily.   furosemide 20 MG tablet Commonly known as: LASIX Take 20-40 mg by mouth See admin instructions. Take 81m at 9am and 229mat 4pm.   gabapentin 300 MG capsule Commonly known as: NEURONTIN Take 300 mg by mouth 3 (three) times daily.   GLUCAGON HCL IJ Inject 1 mg into the  muscle once as needed (hypoglycemia).   guaiFENesin 200 MG tablet Take 200 mg by mouth every 6 (six) hours as needed (congestion).   hydrOXYzine 25 MG tablet Commonly known as: ATARAX Take 25 mg by mouth every 6 (six) hours as needed for itching or anxiety.   insulin lispro 100 UNIT/ML KwikPen Commonly known as: HUMALOG Inject 0-10 Units into the skin See admin instructions. Inject 0-10 units into the skin three times a day and at bedtime, PER SLIDING SCALE:  BGL 151-200 = 2 units 201-250 = 4 units 251-300 = 6 units 301-350 =  8 units 351-400 = 10 units   ipratropium-albuterol 0.5-2.5 (3) MG/3ML Soln Commonly known as: DUONEB Take 3 mLs by nebulization every 6 (six) hours as needed (Shortness of breath).   lactulose 10 GM/15ML solution Commonly known as: CHRONULAC Take 15 mLs (10 g total) by mouth 3 (three) times daily as needed for moderate constipation. What changed: when to take this   Lantus SoloStar 100 UNIT/ML Solostar Pen Generic drug: insulin glargine Inject 12 Units into the skin daily. What changed: when to take this   levalbuterol 0.63 MG/3ML nebulizer solution Commonly known as: XOPENEX Take 3 mLs (0.63 mg total) by nebulization every 6 (six) hours as needed for wheezing or shortness of breath.   lidocaine 4 % Apply 2 patches topically daily. Apply one patch to right shoulder at 9am and remove at 9pm. Apply a second patch to Right stump at 9am and remove at 9pm.   Lubricant Eye Drops 0.4-0.3 % Soln Generic drug: Polyethyl Glycol-Propyl Glycol Place 2 drops into both eyes every 4 (four) hours as needed (dryness/itching).   magnesium  oxide 400 MG tablet Commonly known as: MAG-OX Take 400 mg by mouth 2 (two) times daily.   melatonin 3 MG Tabs tablet Take 3 mg by mouth at bedtime.   methenamine 1 g tablet Commonly known as: MANDELAMINE Take 1 g by mouth daily.   metoprolol tartrate 25 MG tablet Commonly known as: LOPRESSOR Take 0.5 tablets (12.5 mg  total) by mouth 2 (two) times daily.   mirabegron ER 50 MG Tb24 tablet Commonly known as: MYRBETRIQ Take 50 mg by mouth daily.   multivitamin with minerals tablet Take 1 tablet by mouth daily.   Narcan 4 MG/0.1ML Liqd nasal spray kit Generic drug: naloxone Place 1 spray into the nose as needed (poorly responsive or turning blue).   nystatin cream Commonly known as: MYCOSTATIN Apply 1 Application topically See admin instructions. Apply to gluteal fold every day shift for wound care. May apply as needed as well.   omeprazole 20 MG capsule Commonly known as: PRILOSEC Take 20 mg by mouth at bedtime.   ondansetron 4 MG tablet Commonly known as: ZOFRAN Take 4 mg by mouth every 8 (eight) hours as needed for nausea.   oxybutynin 10 MG 24 hr tablet Commonly known as: DITROPAN-XL Take 10 mg by mouth daily.   oxyCODONE 5 MG immediate release tablet Commonly known as: Oxy IR/ROXICODONE Take 1 tablet (5 mg total) by mouth every 8 (eight) hours as needed for severe pain (pain). What changed: reasons to take this   oxycodone 30 MG immediate release tablet Commonly known as: ROXICODONE Take 1 tablet (30 mg total) by mouth every 8 (eight) hours. What changed: Another medication with the same name was changed. Make sure you understand how and when to take each.   OXYGEN Inhale 2 L into the lungs as needed (for shortness of breath).   phenazopyridine 100 MG tablet Commonly known as: PYRIDIUM Take 100 mg by mouth every 8 (eight) hours as needed (dysuria).   polyethylene glycol 17 g packet Commonly known as: MIRALAX / GLYCOLAX Take 17 g by mouth daily.   pregabalin 150 MG capsule Commonly known as: LYRICA Take 1 capsule (150 mg total) by mouth 2 (two) times daily.   promethazine 12.5 MG tablet Commonly known as: PHENERGAN Take 12.5 mg by mouth every 6 (six) hours as needed for nausea or vomiting.   rOPINIRole 2 MG tablet Commonly known as: REQUIP Take 2 mg by mouth 2 (two)  times daily.   rosuvastatin 10 MG tablet Commonly known as: CRESTOR Take 10 mg by mouth at bedtime.   saccharomyces boulardii 250 MG capsule Commonly known as: FLORASTOR Take 1 capsule (250 mg total) by mouth 2 (two) times daily for 7 days.   senna-docusate 8.6-50 MG tablet Commonly known as: Senokot-S Take 1 tablet by mouth at bedtime.   Spiriva Respimat 1.25 MCG/ACT Aers Generic drug: Tiotropium Bromide Monohydrate Inhale 2 each into the lungs in the morning.   tamsulosin 0.4 MG Caps capsule Commonly known as: FLOMAX Take 0.8 mg by mouth daily.   tiZANidine 4 MG tablet Commonly known as: ZANAFLEX Take 4 mg by mouth every 6 (six) hours as needed for muscle spasms.   triamcinolone cream 0.1 % Commonly known as: KENALOG Apply 1 application  topically 2 (two) times daily. Apply to left lower leg   Trulicity 7.62 UQ/3.3HL Sopn Generic drug: Dulaglutide Inject 0.75 mg into the skin every Wednesday.   Uribel 118 MG Caps Take 1 capsule by mouth every 8 (eight) hours as needed (bladder  pain).         The results of significant diagnostics from this hospitalization (including imaging, microbiology, ancillary and laboratory) are listed below for reference.    Procedures and Diagnostic Studies:   X-ray chest PA or AP  Result Date: 05/01/2022 CLINICAL DATA:  Cough EXAM: CHEST  1 VIEW COMPARISON:  Radiograph 01/27/2022 FINDINGS: Unchanged mildly enlarged cardiac silhouette. There is right upper lung airspace disease. No pleural effusion. No evidence of pneumothorax. No acute osseous abnormality. Cervical spine fusion hardware noted. IMPRESSION: Right upper lung airspace disease concerning for pneumonia. Electronically Signed   By: Maurine Simmering M.D.   On: 05/01/2022 14:43   DG C-Arm 1-60 Min-No Report  Result Date: 05/01/2022 Fluoroscopy was utilized by the requesting physician.  No radiographic interpretation.     Labs:   Basic Metabolic Panel: Recent Labs  Lab  05/01/22 0813 05/02/22 1516 05/03/22 1101  NA 140 137 136  K 4.2 3.7 4.1  CL 102 103 102  CO2 _0 GLUCOSE 173* 195* 223*  BUN 37* 32* 31*  CREATININE 2.07* 1.93* 1.81*  CALCIUM 9.0 8.8* 9.4  MG  --   --  2.1  PHOS  --   --  4.1   GFR Estimated Creatinine Clearance: 54.5 mL/min (A) (by C-G formula based on SCr of 1.81 mg/dL (H)). Liver Function Tests: Recent Labs  Lab 05/02/22 1516  AST 14*  ALT 7  ALKPHOS 53  BILITOT 0.4  PROT 6.5  ALBUMIN 3.0*   No results for input(s): "LIPASE", "AMYLASE" in the last 168 hours. No results for input(s): "AMMONIA" in the last 168 hours. Coagulation profile No results for input(s): "INR", "PROTIME" in the last 168 hours.  CBC: Recent Labs  Lab 05/01/22 0815 05/02/22 1516 05/03/22 1101  WBC 10.8* 10.4 8.9  NEUTROABS 7.9* 7.5  --   HGB 10.7* 10.1* 10.7*  HCT 35.1* 32.4* 36.9*  MCV 88.0 87.8 92.9  PLT 225 202 196   Cardiac Enzymes: No results for input(s): "CKTOTAL", "CKMB", "CKMBINDEX", "TROPONINI" in the last 168 hours. BNP: Invalid input(s): "POCBNP" CBG: Recent Labs  Lab 05/03/22 1144 05/03/22 1700 05/03/22 2310 05/04/22 0754 05/04/22 1318  GLUCAP 234* 349* 363* 321* 232*   D-Dimer No results for input(s): "DDIMER" in the last 72 hours. Hgb A1c No results for input(s): "HGBA1C" in the last 72 hours. Lipid Profile No results for input(s): "CHOL", "HDL", "LDLCALC", "TRIG", "CHOLHDL", "LDLDIRECT" in the last 72 hours. Thyroid function studies No results for input(s): "TSH", "T4TOTAL", "T3FREE", "THYROIDAB" in the last 72 hours.  Invalid input(s): "FREET3" Anemia work up No results for input(s): "VITAMINB12", "FOLATE", "FERRITIN", "TIBC", "IRON", "RETICCTPCT" in the last 72 hours. Microbiology No results found for this or any previous visit (from the past 240 hour(s)).  Time coordinating discharge: 35 minutes  Signed: Maleta Pacha  [facility] 05/04/2022, 3:41 PM

## 2022-06-06 ENCOUNTER — Telehealth: Payer: Self-pay | Admitting: *Deleted

## 2022-06-06 NOTE — Telephone Encounter (Signed)
Notified [facility] that Dr Alen Blew says patient needs to see Dermatology

## 2022-06-06 NOTE — Telephone Encounter (Signed)
Received call from [facility] transportation. They state Mr Xiang " has a place on his forehead and their NP wants Dr Alen Blew to see Daeshawn to evaluate"

## 2022-07-24 ENCOUNTER — Ambulatory Visit: Payer: Medicaid Other | Admitting: Podiatry

## 2022-08-20 ENCOUNTER — Encounter (HOSPITAL_BASED_OUTPATIENT_CLINIC_OR_DEPARTMENT_OTHER): Payer: Medicaid Other | Admitting: Internal Medicine

## 2022-08-22 ENCOUNTER — Inpatient Hospital Stay (HOSPITAL_COMMUNITY)
Admission: EM | Admit: 2022-08-22 | Discharge: 2022-08-27 | DRG: 871 | Disposition: A | Discharge status: Skilled Nursing Facility | Payer: Medicaid Other | Source: Skilled Nursing Facility | Attending: Family Medicine | Admitting: Family Medicine

## 2022-08-22 ENCOUNTER — Other Ambulatory Visit: Payer: Self-pay

## 2022-08-22 ENCOUNTER — Emergency Department (HOSPITAL_COMMUNITY): Payer: Medicaid Other

## 2022-08-22 ENCOUNTER — Inpatient Hospital Stay (HOSPITAL_COMMUNITY): Payer: Medicaid Other

## 2022-08-22 DIAGNOSIS — D631 Anemia in chronic kidney disease: Secondary | ICD-10-CM | POA: Diagnosis present

## 2022-08-22 DIAGNOSIS — I890 Lymphedema, not elsewhere classified: Secondary | ICD-10-CM | POA: Diagnosis present

## 2022-08-22 DIAGNOSIS — Z8249 Family history of ischemic heart disease and other diseases of the circulatory system: Secondary | ICD-10-CM

## 2022-08-22 DIAGNOSIS — E1165 Type 2 diabetes mellitus with hyperglycemia: Secondary | ICD-10-CM | POA: Diagnosis present

## 2022-08-22 DIAGNOSIS — Z79899 Other long term (current) drug therapy: Secondary | ICD-10-CM

## 2022-08-22 DIAGNOSIS — A409 Streptococcal sepsis, unspecified: Principal | ICD-10-CM | POA: Diagnosis present

## 2022-08-22 DIAGNOSIS — Z89611 Acquired absence of right leg above knee: Secondary | ICD-10-CM

## 2022-08-22 DIAGNOSIS — J111 Influenza due to unidentified influenza virus with other respiratory manifestations: Secondary | ICD-10-CM

## 2022-08-22 DIAGNOSIS — Z9981 Dependence on supplemental oxygen: Secondary | ICD-10-CM

## 2022-08-22 DIAGNOSIS — E11621 Type 2 diabetes mellitus with foot ulcer: Secondary | ICD-10-CM | POA: Diagnosis present

## 2022-08-22 DIAGNOSIS — E8729 Other acidosis: Secondary | ICD-10-CM | POA: Diagnosis present

## 2022-08-22 DIAGNOSIS — I130 Hypertensive heart and chronic kidney disease with heart failure and stage 1 through stage 4 chronic kidney disease, or unspecified chronic kidney disease: Secondary | ICD-10-CM | POA: Diagnosis present

## 2022-08-22 DIAGNOSIS — Z89422 Acquired absence of other left toe(s): Secondary | ICD-10-CM

## 2022-08-22 DIAGNOSIS — L0390 Cellulitis, unspecified: Secondary | ICD-10-CM | POA: Diagnosis not present

## 2022-08-22 DIAGNOSIS — L8990 Pressure ulcer of unspecified site, unspecified stage: Secondary | ICD-10-CM | POA: Insufficient documentation

## 2022-08-22 DIAGNOSIS — Z89511 Acquired absence of right leg below knee: Secondary | ICD-10-CM

## 2022-08-22 DIAGNOSIS — Z833 Family history of diabetes mellitus: Secondary | ICD-10-CM

## 2022-08-22 DIAGNOSIS — L03116 Cellulitis of left lower limb: Secondary | ICD-10-CM | POA: Diagnosis present

## 2022-08-22 DIAGNOSIS — I509 Heart failure, unspecified: Secondary | ICD-10-CM | POA: Diagnosis not present

## 2022-08-22 DIAGNOSIS — Z1152 Encounter for screening for COVID-19: Secondary | ICD-10-CM | POA: Diagnosis not present

## 2022-08-22 DIAGNOSIS — M86372 Chronic multifocal osteomyelitis, left ankle and foot: Secondary | ICD-10-CM | POA: Diagnosis present

## 2022-08-22 DIAGNOSIS — J9601 Acute respiratory failure with hypoxia: Secondary | ICD-10-CM

## 2022-08-22 DIAGNOSIS — E669 Obesity, unspecified: Secondary | ICD-10-CM | POA: Diagnosis present

## 2022-08-22 DIAGNOSIS — R52 Pain, unspecified: Secondary | ICD-10-CM | POA: Diagnosis not present

## 2022-08-22 DIAGNOSIS — J1001 Influenza due to other identified influenza virus with the same other identified influenza virus pneumonia: Secondary | ICD-10-CM | POA: Diagnosis present

## 2022-08-22 DIAGNOSIS — Z8614 Personal history of Methicillin resistant Staphylococcus aureus infection: Secondary | ICD-10-CM

## 2022-08-22 DIAGNOSIS — Z89412 Acquired absence of left great toe: Secondary | ICD-10-CM

## 2022-08-22 DIAGNOSIS — Z8616 Personal history of COVID-19: Secondary | ICD-10-CM | POA: Diagnosis not present

## 2022-08-22 DIAGNOSIS — M7989 Other specified soft tissue disorders: Secondary | ICD-10-CM | POA: Diagnosis not present

## 2022-08-22 DIAGNOSIS — E1122 Type 2 diabetes mellitus with diabetic chronic kidney disease: Secondary | ICD-10-CM | POA: Diagnosis present

## 2022-08-22 DIAGNOSIS — F17210 Nicotine dependence, cigarettes, uncomplicated: Secondary | ICD-10-CM | POA: Diagnosis present

## 2022-08-22 DIAGNOSIS — I3139 Other pericardial effusion (noninflammatory): Secondary | ICD-10-CM | POA: Diagnosis present

## 2022-08-22 DIAGNOSIS — J440 Chronic obstructive pulmonary disease with acute lower respiratory infection: Secondary | ICD-10-CM | POA: Diagnosis present

## 2022-08-22 DIAGNOSIS — N1832 Chronic kidney disease, stage 3b: Secondary | ICD-10-CM | POA: Diagnosis present

## 2022-08-22 DIAGNOSIS — Z79891 Long term (current) use of opiate analgesic: Secondary | ICD-10-CM

## 2022-08-22 DIAGNOSIS — J441 Chronic obstructive pulmonary disease with (acute) exacerbation: Secondary | ICD-10-CM | POA: Diagnosis present

## 2022-08-22 DIAGNOSIS — Z85828 Personal history of other malignant neoplasm of skin: Secondary | ICD-10-CM

## 2022-08-22 DIAGNOSIS — Z85820 Personal history of malignant melanoma of skin: Secondary | ICD-10-CM

## 2022-08-22 DIAGNOSIS — J9621 Acute and chronic respiratory failure with hypoxia: Secondary | ICD-10-CM | POA: Diagnosis present

## 2022-08-22 DIAGNOSIS — A419 Sepsis, unspecified organism: Secondary | ICD-10-CM | POA: Diagnosis present

## 2022-08-22 DIAGNOSIS — I5032 Chronic diastolic (congestive) heart failure: Secondary | ICD-10-CM | POA: Diagnosis present

## 2022-08-22 DIAGNOSIS — Z7985 Long-term (current) use of injectable non-insulin antidiabetic drugs: Secondary | ICD-10-CM

## 2022-08-22 DIAGNOSIS — L97429 Non-pressure chronic ulcer of left heel and midfoot with unspecified severity: Secondary | ICD-10-CM | POA: Diagnosis present

## 2022-08-22 DIAGNOSIS — Z794 Long term (current) use of insulin: Secondary | ICD-10-CM

## 2022-08-22 DIAGNOSIS — R6520 Severe sepsis without septic shock: Secondary | ICD-10-CM | POA: Diagnosis present

## 2022-08-22 DIAGNOSIS — N179 Acute kidney failure, unspecified: Secondary | ICD-10-CM | POA: Diagnosis present

## 2022-08-22 DIAGNOSIS — R509 Fever, unspecified: Secondary | ICD-10-CM | POA: Diagnosis present

## 2022-08-22 DIAGNOSIS — Z981 Arthrodesis status: Secondary | ICD-10-CM

## 2022-08-22 DIAGNOSIS — E785 Hyperlipidemia, unspecified: Secondary | ICD-10-CM | POA: Diagnosis present

## 2022-08-22 DIAGNOSIS — K219 Gastro-esophageal reflux disease without esophagitis: Secondary | ICD-10-CM | POA: Diagnosis present

## 2022-08-22 DIAGNOSIS — E869 Volume depletion, unspecified: Secondary | ICD-10-CM | POA: Diagnosis present

## 2022-08-22 DIAGNOSIS — F329 Major depressive disorder, single episode, unspecified: Secondary | ICD-10-CM | POA: Diagnosis present

## 2022-08-22 DIAGNOSIS — G894 Chronic pain syndrome: Secondary | ICD-10-CM | POA: Diagnosis present

## 2022-08-22 DIAGNOSIS — J9690 Respiratory failure, unspecified, unspecified whether with hypoxia or hypercapnia: Secondary | ICD-10-CM | POA: Diagnosis present

## 2022-08-22 DIAGNOSIS — J101 Influenza due to other identified influenza virus with other respiratory manifestations: Secondary | ICD-10-CM | POA: Diagnosis present

## 2022-08-22 DIAGNOSIS — R21 Rash and other nonspecific skin eruption: Secondary | ICD-10-CM | POA: Diagnosis present

## 2022-08-22 DIAGNOSIS — G9009 Other idiopathic peripheral autonomic neuropathy: Secondary | ICD-10-CM | POA: Diagnosis present

## 2022-08-22 DIAGNOSIS — Z87440 Personal history of urinary (tract) infections: Secondary | ICD-10-CM

## 2022-08-22 DIAGNOSIS — Z6829 Body mass index (BMI) 29.0-29.9, adult: Secondary | ICD-10-CM

## 2022-08-22 DIAGNOSIS — L97529 Non-pressure chronic ulcer of other part of left foot with unspecified severity: Secondary | ICD-10-CM | POA: Diagnosis present

## 2022-08-22 DIAGNOSIS — M549 Dorsalgia, unspecified: Secondary | ICD-10-CM | POA: Diagnosis present

## 2022-08-22 DIAGNOSIS — G4700 Insomnia, unspecified: Secondary | ICD-10-CM | POA: Diagnosis present

## 2022-08-22 DIAGNOSIS — N319 Neuromuscular dysfunction of bladder, unspecified: Secondary | ICD-10-CM | POA: Diagnosis present

## 2022-08-22 DIAGNOSIS — Z8619 Personal history of other infectious and parasitic diseases: Secondary | ICD-10-CM

## 2022-08-22 LAB — COMPREHENSIVE METABOLIC PANEL
ALT: 24 U/L (ref 0–44)
AST: 48 U/L — ABNORMAL HIGH (ref 15–41)
Albumin: 2.8 g/dL — ABNORMAL LOW (ref 3.5–5.0)
Alkaline Phosphatase: 48 U/L (ref 38–126)
Anion gap: 17 — ABNORMAL HIGH (ref 5–15)
BUN: 57 mg/dL — ABNORMAL HIGH (ref 8–23)
CO2: 18 mmol/L — ABNORMAL LOW (ref 22–32)
Calcium: 8.5 mg/dL — ABNORMAL LOW (ref 8.9–10.3)
Chloride: 102 mmol/L (ref 98–111)
Creatinine, Ser: 3.87 mg/dL — ABNORMAL HIGH (ref 0.61–1.24)
GFR, Estimated: 17 mL/min — ABNORMAL LOW (ref >60)
Glucose, Bld: 187 mg/dL — ABNORMAL HIGH (ref 70–99)
Potassium: 4.7 mmol/L (ref 3.5–5.1)
Sodium: 137 mmol/L (ref 135–145)
Total Bilirubin: 0.7 mg/dL (ref 0.3–1.2)
Total Protein: 6.8 g/dL (ref 6.5–8.1)

## 2022-08-22 LAB — CREATININE, SERUM
Creatinine, Ser: 3.66 mg/dL — ABNORMAL HIGH (ref 0.61–1.24)
GFR, Estimated: 18 mL/min — ABNORMAL LOW (ref >60)

## 2022-08-22 LAB — I-STAT ARTERIAL BLOOD GAS, ED
Acid-base deficit: 3 mmol/L — ABNORMAL HIGH (ref 0.0–2.0)
Bicarbonate: 20.7 mmol/L (ref 20.0–28.0)
Calcium, Ion: 1.13 mmol/L — ABNORMAL LOW (ref 1.15–1.40)
HCT: 26 % — ABNORMAL LOW (ref 39.0–52.0)
Hemoglobin: 8.8 g/dL — ABNORMAL LOW (ref 13.0–17.0)
O2 Saturation: 99 %
Patient temperature: 103.1
Potassium: 4.2 mmol/L (ref 3.5–5.1)
Sodium: 137 mmol/L (ref 135–145)
TCO2: 22 mmol/L (ref 22–32)
pCO2 arterial: 33.7 mmHg (ref 32–48)
pH, Arterial: 7.407 (ref 7.35–7.45)
pO2, Arterial: 159 mmHg — ABNORMAL HIGH (ref 83–108)

## 2022-08-22 LAB — URINALYSIS, ROUTINE W REFLEX MICROSCOPIC
Bacteria, UA: NONE SEEN
Bilirubin Urine: NEGATIVE
Glucose, UA: NEGATIVE mg/dL
Hgb urine dipstick: NEGATIVE
Ketones, ur: NEGATIVE mg/dL
Leukocytes,Ua: NEGATIVE
Nitrite: NEGATIVE
Protein, ur: 100 mg/dL — AB
Specific Gravity, Urine: 1.016 (ref 1.005–1.030)
pH: 5 (ref 5.0–8.0)

## 2022-08-22 LAB — CBC WITH DIFFERENTIAL/PLATELET
Abs Immature Granulocytes: 0.19 10*3/uL — ABNORMAL HIGH (ref 0.00–0.07)
Basophils Absolute: 0.1 10*3/uL (ref 0.0–0.1)
Basophils Relative: 0 %
Eosinophils Absolute: 0.3 10*3/uL (ref 0.0–0.5)
Eosinophils Relative: 2 %
HCT: 32.9 % — ABNORMAL LOW (ref 39.0–52.0)
Hemoglobin: 10 g/dL — ABNORMAL LOW (ref 13.0–17.0)
Immature Granulocytes: 1 %
Lymphocytes Relative: 5 %
Lymphs Abs: 0.7 10*3/uL (ref 0.7–4.0)
MCH: 26.1 pg (ref 26.0–34.0)
MCHC: 30.4 g/dL (ref 30.0–36.0)
MCV: 85.9 fL (ref 80.0–100.0)
Monocytes Absolute: 0.4 10*3/uL (ref 0.1–1.0)
Monocytes Relative: 3 %
Neutro Abs: 13.9 10*3/uL — ABNORMAL HIGH (ref 1.7–7.7)
Neutrophils Relative %: 89 %
Platelets: 219 10*3/uL (ref 150–400)
RBC: 3.83 MIL/uL — ABNORMAL LOW (ref 4.22–5.81)
RDW: 15.6 % — ABNORMAL HIGH (ref 11.5–15.5)
WBC: 15.5 10*3/uL — ABNORMAL HIGH (ref 4.0–10.5)
nRBC: 0 % (ref 0.0–0.2)

## 2022-08-22 LAB — CBC
HCT: 30 % — ABNORMAL LOW (ref 39.0–52.0)
Hemoglobin: 9.5 g/dL — ABNORMAL LOW (ref 13.0–17.0)
MCH: 26.8 pg (ref 26.0–34.0)
MCHC: 31.7 g/dL (ref 30.0–36.0)
MCV: 84.7 fL (ref 80.0–100.0)
Platelets: 201 10*3/uL (ref 150–400)
RBC: 3.54 MIL/uL — ABNORMAL LOW (ref 4.22–5.81)
RDW: 15.5 % (ref 11.5–15.5)
WBC: 13.1 10*3/uL — ABNORMAL HIGH (ref 4.0–10.5)
nRBC: 0 % (ref 0.0–0.2)

## 2022-08-22 LAB — PROTIME-INR
INR: 1.1 (ref 0.8–1.2)
Prothrombin Time: 14.2 seconds (ref 11.4–15.2)

## 2022-08-22 LAB — RESP PANEL BY RT-PCR (RSV, FLU A&B, COVID)  RVPGX2
Influenza A by PCR: POSITIVE — AB
Influenza B by PCR: NEGATIVE
Resp Syncytial Virus by PCR: NEGATIVE
SARS Coronavirus 2 by RT PCR: NEGATIVE

## 2022-08-22 LAB — LACTIC ACID, PLASMA: Lactic Acid, Venous: 2.6 mmol/L (ref 0.5–1.9)

## 2022-08-22 LAB — APTT: aPTT: 39 seconds — ABNORMAL HIGH (ref 24–36)

## 2022-08-22 LAB — HIV ANTIBODY (ROUTINE TESTING W REFLEX): HIV Screen 4th Generation wRfx: NONREACTIVE

## 2022-08-22 LAB — CBG MONITORING, ED
Glucose-Capillary: 189 mg/dL — ABNORMAL HIGH (ref 70–99)
Glucose-Capillary: 145 mg/dL — ABNORMAL HIGH (ref 70–99)

## 2022-08-22 LAB — GLUCOSE, CAPILLARY: Glucose-Capillary: 207 mg/dL — ABNORMAL HIGH (ref 70–99)

## 2022-08-22 LAB — BRAIN NATRIURETIC PEPTIDE: B Natriuretic Peptide: 171.8 pg/mL — ABNORMAL HIGH (ref 0.0–100.0)

## 2022-08-22 MED ORDER — ALBUMIN HUMAN 25 % IV SOLN
25.0000 g | INTRAVENOUS | Status: AC
  Administered 2022-08-22: 25 g via INTRAVENOUS
  Filled 2022-08-22: qty 100

## 2022-08-22 MED ORDER — OXYMETAZOLINE HCL 0.05 % NA SOLN
1.0000 | Freq: Two times a day (BID) | NASAL | Status: AC | PRN
  Administered 2022-08-22: 1 via NASAL
  Filled 2022-08-22: qty 30

## 2022-08-22 MED ORDER — METRONIDAZOLE 500 MG/100ML IV SOLN
500.0000 mg | Freq: Once | INTRAVENOUS | Status: AC
  Administered 2022-08-22: 500 mg via INTRAVENOUS
  Filled 2022-08-22: qty 100

## 2022-08-22 MED ORDER — OXYCODONE HCL 5 MG PO TABS
5.0000 mg | ORAL_TABLET | ORAL | Status: AC
  Administered 2022-08-22: 5 mg via ORAL
  Filled 2022-08-22: qty 1

## 2022-08-22 MED ORDER — GUAIFENESIN 200 MG PO TABS: 200.0000 mg | ORAL_TABLET | Freq: Four times a day (QID) | ORAL | Status: DC | PRN

## 2022-08-22 MED ORDER — PIPERACILLIN-TAZOBACTAM 3.375 G IVPB 30 MIN
3.3750 g | Freq: Once | INTRAVENOUS | Status: AC
  Administered 2022-08-22: 3.375 g via INTRAVENOUS
  Filled 2022-08-22 (×2): qty 50

## 2022-08-22 MED ORDER — IPRATROPIUM-ALBUTEROL 0.5-2.5 (3) MG/3ML IN SOLN: 3.0000 mL | Freq: Four times a day (QID) | RESPIRATORY_TRACT | Status: DC | PRN

## 2022-08-22 MED ORDER — FENTANYL CITRATE PF 50 MCG/ML IJ SOSY
25.0000 ug | PREFILLED_SYRINGE | INTRAMUSCULAR | Status: DC | PRN
  Administered 2022-08-22 (×2): 25 ug via INTRAVENOUS
  Filled 2022-08-22 (×2): qty 1

## 2022-08-22 MED ORDER — PIPERACILLIN-TAZOBACTAM 3.375 G IVPB
3.3750 g | Freq: Three times a day (TID) | INTRAVENOUS | Status: DC
  Administered 2022-08-23 – 2022-08-25 (×8): 3.375 g via INTRAVENOUS
  Filled 2022-08-22 (×7): qty 50

## 2022-08-22 MED ORDER — IPRATROPIUM-ALBUTEROL 0.5-2.5 (3) MG/3ML IN SOLN
3.0000 mL | Freq: Four times a day (QID) | RESPIRATORY_TRACT | Status: DC
  Administered 2022-08-22 – 2022-08-23 (×2): 3 mL via RESPIRATORY_TRACT
  Filled 2022-08-22 (×5): qty 3

## 2022-08-22 MED ORDER — OSELTAMIVIR PHOSPHATE 30 MG PO CAPS
30.0000 mg | ORAL_CAPSULE | Freq: Every day | ORAL | Status: AC
  Administered 2022-08-22 – 2022-08-26 (×5): 30 mg via ORAL
  Filled 2022-08-22 (×5): qty 1

## 2022-08-22 MED ORDER — INSULIN GLARGINE-YFGN 100 UNIT/ML ~~LOC~~ SOLN: 20.0000 [IU] | Freq: Every day | SUBCUTANEOUS | Status: DC

## 2022-08-22 MED ORDER — OXYCODONE HCL 5 MG PO TABS: 30.0000 mg | ORAL_TABLET | Freq: Three times a day (TID) | ORAL | Status: DC

## 2022-08-22 MED ORDER — HEPARIN SODIUM (PORCINE) 5000 UNIT/ML IJ SOLN
5000.0000 [IU] | Freq: Three times a day (TID) | INTRAMUSCULAR | Status: DC
  Administered 2022-08-22 – 2022-08-27 (×15): 5000 [IU] via SUBCUTANEOUS
  Filled 2022-08-22 (×15): qty 1

## 2022-08-22 MED ORDER — LEVALBUTEROL HCL 0.63 MG/3ML IN NEBU: 0.6300 mg | INHALATION_SOLUTION | Freq: Four times a day (QID) | RESPIRATORY_TRACT | Status: DC | PRN

## 2022-08-22 MED ORDER — NICOTINE 21 MG/24HR TD PT24
21.0000 mg | MEDICATED_PATCH | Freq: Every day | TRANSDERMAL | Status: DC
  Administered 2022-08-22 – 2022-08-27 (×6): 21 mg via TRANSDERMAL
  Filled 2022-08-22 (×6): qty 1

## 2022-08-22 MED ORDER — VANCOMYCIN HCL 1750 MG/350ML IV SOLN
1750.0000 mg | Freq: Once | INTRAVENOUS | Status: AC
  Administered 2022-08-22: 1750 mg via INTRAVENOUS
  Filled 2022-08-22: qty 350

## 2022-08-22 MED ORDER — UMECLIDINIUM BROMIDE 62.5 MCG/ACT IN AEPB
1.0000 | INHALATION_SPRAY | Freq: Every day | RESPIRATORY_TRACT | Status: DC
  Administered 2022-08-23 – 2022-08-26 (×4): 1 via RESPIRATORY_TRACT
  Filled 2022-08-22 (×2): qty 7

## 2022-08-22 MED ORDER — OXYCODONE HCL 5 MG PO TABS
10.0000 mg | ORAL_TABLET | ORAL | Status: AC
  Administered 2022-08-22: 10 mg via ORAL
  Filled 2022-08-22: qty 2

## 2022-08-22 MED ORDER — OXYCODONE HCL 5 MG PO TABS
15.0000 mg | ORAL_TABLET | Freq: Four times a day (QID) | ORAL | Status: DC | PRN
  Administered 2022-08-22 – 2022-08-24 (×5): 15 mg via ORAL
  Filled 2022-08-22 (×6): qty 3

## 2022-08-22 MED ORDER — SODIUM CHLORIDE 0.9 % IV SOLN: 100.0000 mg | Freq: Two times a day (BID) | INTRAVENOUS | Status: DC

## 2022-08-22 MED ORDER — INSULIN GLARGINE-YFGN 100 UNIT/ML ~~LOC~~ SOLN
15.0000 [IU] | Freq: Every day | SUBCUTANEOUS | Status: DC
  Administered 2022-08-23 – 2022-08-25 (×3): 15 [IU] via SUBCUTANEOUS
  Filled 2022-08-22 (×5): qty 0.15

## 2022-08-22 MED ORDER — ACETAMINOPHEN 500 MG PO TABS
1000.0000 mg | ORAL_TABLET | ORAL | Status: AC
  Administered 2022-08-22: 1000 mg via ORAL
  Filled 2022-08-22: qty 2

## 2022-08-22 MED ORDER — SODIUM CHLORIDE 0.9 % IV SOLN
2.0000 g | Freq: Once | INTRAVENOUS | Status: AC
  Administered 2022-08-22: 2 g via INTRAVENOUS
  Filled 2022-08-22: qty 12.5

## 2022-08-22 MED ORDER — SODIUM CHLORIDE 0.9 % IV BOLUS
1000.0000 mL | Freq: Once | INTRAVENOUS | Status: AC
  Administered 2022-08-22: 1000 mL via INTRAVENOUS

## 2022-08-22 MED ORDER — SODIUM CHLORIDE 0.9 % IV SOLN: 2.0000 g | INTRAVENOUS | Status: DC

## 2022-08-22 MED ORDER — OXYCODONE HCL 5 MG PO TABS
5.0000 mg | ORAL_TABLET | Freq: Three times a day (TID) | ORAL | Status: DC | PRN
  Administered 2022-08-22: 5 mg via ORAL
  Filled 2022-08-22: qty 1

## 2022-08-22 MED ORDER — LACTATED RINGERS IV SOLN: INTRAVENOUS | Status: AC

## 2022-08-22 MED ORDER — IPRATROPIUM-ALBUTEROL 0.5-2.5 (3) MG/3ML IN SOLN
3.0000 mL | Freq: Once | RESPIRATORY_TRACT | Status: AC
  Administered 2022-08-22: 3 mL via RESPIRATORY_TRACT
  Filled 2022-08-22: qty 3

## 2022-08-22 MED ORDER — INSULIN ASPART 100 UNIT/ML IJ SOLN
0.0000 [IU] | Freq: Three times a day (TID) | INTRAMUSCULAR | Status: DC
  Administered 2022-08-22 – 2022-08-23 (×2): 1 [IU] via SUBCUTANEOUS
  Administered 2022-08-23: 2 [IU] via SUBCUTANEOUS
  Administered 2022-08-23: 1 [IU] via SUBCUTANEOUS
  Administered 2022-08-24 – 2022-08-25 (×4): 2 [IU] via SUBCUTANEOUS
  Administered 2022-08-25: 1 [IU] via SUBCUTANEOUS
  Administered 2022-08-25 – 2022-08-26 (×2): 9 [IU] via SUBCUTANEOUS
  Administered 2022-08-26: 1 [IU] via SUBCUTANEOUS
  Administered 2022-08-26: 2 [IU] via SUBCUTANEOUS
  Administered 2022-08-27: 3 [IU] via SUBCUTANEOUS

## 2022-08-22 MED ORDER — HEPARIN SODIUM (PORCINE) 5000 UNIT/ML IJ SOLN: 5000.0000 [IU] | Freq: Three times a day (TID) | INTRAMUSCULAR | Status: DC

## 2022-08-22 MED ORDER — VANCOMYCIN VARIABLE DOSE PER UNSTABLE RENAL FUNCTION (PHARMACIST DOSING): Status: DC

## 2022-08-22 MED ORDER — AZITHROMYCIN 500 MG PO TABS
500.0000 mg | ORAL_TABLET | Freq: Every day | ORAL | Status: AC
  Administered 2022-08-22 – 2022-08-26 (×5): 500 mg via ORAL
  Filled 2022-08-22 (×4): qty 1
  Filled 2022-08-22: qty 2

## 2022-08-22 MED ORDER — DOCUSATE SODIUM 100 MG PO CAPS: 100.0000 mg | ORAL_CAPSULE | Freq: Two times a day (BID) | ORAL | Status: DC | PRN

## 2022-08-22 MED ORDER — FLUTICASONE PROPIONATE 50 MCG/ACT NA SUSP
1.0000 | Freq: Every day | NASAL | Status: DC
  Administered 2022-08-23 – 2022-08-27 (×4): 1 via NASAL
  Filled 2022-08-22: qty 16

## 2022-08-22 MED ORDER — INSULIN ASPART 100 UNIT/ML IJ SOLN
0.0000 [IU] | Freq: Every day | INTRAMUSCULAR | Status: DC
  Administered 2022-08-22 – 2022-08-23 (×2): 2 [IU] via SUBCUTANEOUS
  Administered 2022-08-25 – 2022-08-26 (×2): 4 [IU] via SUBCUTANEOUS

## 2022-08-22 MED ORDER — HYDROMORPHONE HCL 1 MG/ML IJ SOLN
0.5000 mg | INTRAMUSCULAR | Status: DC | PRN
  Administered 2022-08-22 – 2022-08-26 (×23): 1 mg via INTRAVENOUS
  Filled 2022-08-22 (×25): qty 1

## 2022-08-22 MED ORDER — PREDNISONE 20 MG PO TABS
40.0000 mg | ORAL_TABLET | Freq: Every day | ORAL | Status: DC
  Administered 2022-08-23 – 2022-08-26 (×4): 40 mg via ORAL
  Filled 2022-08-22 (×4): qty 2

## 2022-08-22 MED ORDER — SENNOSIDES-DOCUSATE SODIUM 8.6-50 MG PO TABS
1.0000 | ORAL_TABLET | Freq: Every day | ORAL | Status: DC
  Administered 2022-08-22 – 2022-08-25 (×4): 1 via ORAL
  Filled 2022-08-22 (×5): qty 1

## 2022-08-22 MED ORDER — PANTOPRAZOLE SODIUM 40 MG PO TBEC
40.0000 mg | DELAYED_RELEASE_TABLET | Freq: Every day | ORAL | Status: DC
  Administered 2022-08-22 – 2022-08-27 (×6): 40 mg via ORAL
  Filled 2022-08-22 (×6): qty 1

## 2022-08-22 MED ORDER — LACTATED RINGERS IV BOLUS
1000.0000 mL | Freq: Once | INTRAVENOUS | Status: AC
  Administered 2022-08-22: 1000 mL via INTRAVENOUS

## 2022-08-22 MED ORDER — BUDESONIDE 0.25 MG/2ML IN SUSP
0.2500 mg | Freq: Two times a day (BID) | RESPIRATORY_TRACT | Status: DC
  Filled 2022-08-22 (×6): qty 2

## 2022-08-22 MED ORDER — POLYETHYLENE GLYCOL 3350 17 G PO PACK
17.0000 g | PACK | Freq: Every day | ORAL | Status: DC
  Administered 2022-08-22 – 2022-08-25 (×3): 17 g via ORAL
  Filled 2022-08-22 (×5): qty 1

## 2022-08-22 MED ORDER — GABAPENTIN 100 MG PO CAPS
100.0000 mg | ORAL_CAPSULE | Freq: Three times a day (TID) | ORAL | Status: DC
  Administered 2022-08-22 – 2022-08-27 (×15): 100 mg via ORAL
  Filled 2022-08-22 (×14): qty 1

## 2022-08-22 MED ORDER — POLYETHYLENE GLYCOL 3350 17 G PO PACK: 17.0000 g | PACK | Freq: Every day | ORAL | Status: DC | PRN

## 2022-08-22 MED ORDER — ACETAMINOPHEN 325 MG PO TABS
650.0000 mg | ORAL_TABLET | Freq: Four times a day (QID) | ORAL | Status: DC | PRN
  Administered 2022-08-22 – 2022-08-27 (×4): 650 mg via ORAL
  Filled 2022-08-22 (×4): qty 2

## 2022-08-22 NOTE — Progress Notes (Addendum)
BP borderline low, will order one more IV bolus of 1000 ml NS.  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 MRI of left foot ordered to further differentiate. Change cefepime to zosyn to further cover possible anaerobics infection. Consider podiatry consult after foot MRI.

## 2022-08-22 NOTE — Progress Notes (Signed)
Per ABG RT removed pt from BiPAP and placed pt on 2L [place]. Pt tolerating well. RN of pt notified.

## 2022-08-22 NOTE — Progress Notes (Signed)
Elink following code sepsis °

## 2022-08-22 NOTE — ED Notes (Signed)
Pt reports pain is severe 9/10 in left leg pt medicated per mar

## 2022-08-22 NOTE — Progress Notes (Signed)
Refused Bipap and breathing tx's

## 2022-08-22 NOTE — ED Provider Notes (Signed)
[facility] Provider Note   CSN: [id number] Arrival date & time: 08/22/22  0847     History  Chief Complaint  Patient presents with   Code Sepsis    LAJON STETZER is a 62 y.o. male.  62 year old male with a history of diabetes, hypertension, hyperlipidemia, heart failure with preserved ejection fraction, COPD, CKD, chronic ABI, and diabetic foot ulcer who presents to the emergency department with concerns for sepsis.  Patient was reportedly at his facility today when he started complaining of diffuse pain became febrile so was transported to the emergency department for further evaluation.  They note that he has had some drainage from his left lower extremity at this facility.  Patient reports that he is in diffuse pain but that his leg is hurting more than usual.  States he has also had a cough recently and burning when he pees.  Denies any additional symptoms.  Says that he is not on oxygen at home.       Home Medications Prior to Admission medications   Medication Sig Start Date End Date Taking? Authorizing Provider  cetirizine (ZYRTEC) 10 MG tablet Take 10 mg by mouth daily.    [provider]  cyanocobalamin 1000 MCG tablet Take 1,000 mcg by mouth daily.    [provider]  diclofenac Sodium (VOLTAREN) 1 % GEL Apply 4 g topically every 8 (eight) hours as needed (for pain). On right leg    [provider]  docusate sodium (COLACE) 100 MG capsule Take 1 capsule (100 mg total) by mouth 2 (two) times daily. 05/31/19   Amin, Jeanella Flattery, MD  eszopiclone (LUNESTA) 2 MG TABS tablet Take 2 mg by mouth at bedtime. Take immediately before bedtime    [provider]  ferrous sulfate 325 (65 FE) MG tablet Take 1 tablet (325 mg total) by mouth daily with breakfast. 11/23/18   Matilde Haymaker, MD  fluticasone [facility]) 50 MCG/ACT nasal spray Place 1 spray into both nostrils daily.    [provider]   furosemide (LASIX) 20 MG tablet Take 20-40 mg by mouth See admin instructions. Take '40mg'$  at 9am and '20mg'$  at 4pm.    [provider]  gabapentin (NEURONTIN) 300 MG capsule Take 300 mg by mouth 3 (three) times daily.    [provider]  GLUCAGON HCL IJ Inject 1 mg into the muscle once as needed (hypoglycemia).    [provider]  guaiFENesin 200 MG tablet Take 200 mg by mouth every 6 (six) hours as needed (congestion).    [provider]  hydrOXYzine (ATARAX) 25 MG tablet Take 25 mg by mouth every 6 (six) hours as needed for itching or anxiety.    [provider]  [employer] [facility]) OINT Apply 1 Application topically in the morning and at bedtime. To buttocks    [provider]  insulin glargine (LANTUS SOLOSTAR) 100 UNIT/ML Solostar Pen Inject 12 Units into the skin daily. Patient taking differently: Inject 12 Units into the skin 2 (two) times daily. 01/31/22   Dahal, Marlowe Aschoff, MD  insulin lispro (HUMALOG) 100 UNIT/ML KwikPen Inject 0-10 Units into the skin See admin instructions. Inject 0-10 units into the skin three times a day and at bedtime, PER SLIDING SCALE:  BGL 151-200 = 2 units 201-250 = 4 units 251-300 = 6 units 301-350 =  8 units 351-400 = 10 units 01/31/22   Dahal, Marlowe Aschoff, MD  ipratropium-albuterol (DUONEB) 0.5-2.5 (3) MG/3ML  SOLN Take 3 mLs by nebulization every 6 (six) hours as needed (Shortness of breath).    [provider]  lactulose (CHRONULAC) 10 GM/15ML solution Take 15 mLs (10 g total) by mouth 3 (three) times daily as needed for moderate constipation. Patient taking differently: Take 10 g by mouth 3 (three) times daily. 01/31/22   Terrilee Croak, MD  levalbuterol (XOPENEX) 0.63 MG/3ML nebulizer solution Take 3 mLs (0.63 mg total) by nebulization every 6 (six) hours as needed for wheezing or shortness of breath. 01/31/22   Terrilee Croak, MD  Lidocaine 4 % PTCH Apply 2 patches topically daily. Apply one  patch to right shoulder at 9am and remove at 9pm. Apply a second patch to Right stump at 9am and remove at 9pm.    [provider]  magnesium oxide (MAG-OX) 400 MG tablet Take 400 mg by mouth 2 (two) times daily.    [provider]  melatonin 3 MG TABS tablet Take 3 mg by mouth at bedtime.    [provider]  Menthol, Topical Analgesic, (BIOFREEZE) 4 % GEL Apply 1 Application topically every 8 (eight) hours as needed (for pain). Apply to right leg/stump    [provider]  Meth-Hyo-M Bl-Na Phos-Ph Sal (URIBEL) 118 MG CAPS Take 1 capsule by mouth every 8 (eight) hours as needed (bladder pain).    [provider]  methenamine (MANDELAMINE) 1 g tablet Take 1 g by mouth daily.    [provider]  metoprolol tartrate (LOPRESSOR) 25 MG tablet Take 0.5 tablets (12.5 mg total) by mouth 2 (two) times daily. 07/18/19 09/06/22  Elodia Florence., MD  mirabegron ER (MYRBETRIQ) 50 MG TB24 tablet Take 50 mg by mouth daily.    [provider]  Multiple Vitamins-Minerals (MULTIVITAMIN WITH MINERALS) tablet Take 1 tablet by mouth daily.    [provider]  naloxone [facility]) 4 MG/0.1ML LIQD nasal spray kit Place 1 spray into the nose as needed (poorly responsive or turning blue).    [provider]  nystatin cream (MYCOSTATIN) Apply 1 Application topically See admin instructions. Apply to gluteal fold every day shift for wound care. May apply as needed as well.    [provider]  omeprazole (PRILOSEC) 20 MG capsule Take 20 mg by mouth at bedtime.    [provider]  ondansetron (ZOFRAN) 4 MG tablet Take 4 mg by mouth every 8 (eight) hours as needed for nausea.    [provider]  oxybutynin (DITROPAN-XL) 10 MG 24 hr tablet Take 10 mg by mouth daily.    [provider]  oxyCODONE (OXY IR/ROXICODONE) 5 MG immediate release tablet Take 1 tablet (5 mg total) by mouth every 8 (eight) hours as needed for  severe pain (pain). Patient taking differently: Take 5 mg by mouth every 8 (eight) hours as needed (pain). 09/08/21   Charlynne Cousins, MD  oxycodone (ROXICODONE) 30 MG immediate release tablet Take 1 tablet (30 mg total) by mouth every 8 (eight) hours. 09/08/21   Charlynne Cousins, MD  OXYGEN Inhale 2 L into the lungs as needed (for shortness of breath).    [provider]  oxymetazoline (AFRIN 12 HOUR) 0.05 % nasal spray Place 1 spray into both nostrils 2 (two) times daily as needed for congestion.    [provider]  phenazopyridine (PYRIDIUM) 100 MG tablet Take 100 mg by mouth every 8 (eight) hours as needed (dysuria).    [provider]  Polyethyl Glycol-Propyl Glycol (LUBRICANT  EYE DROPS) 0.4-0.3 % SOLN Place 2 drops into both eyes every 4 (four) hours as needed (dryness/itching).    [provider]  polyethylene glycol (MIRALAX / GLYCOLAX) 17 g packet Take 17 g by mouth daily.    [provider]  pregabalin (LYRICA) 150 MG capsule Take 1 capsule (150 mg total) by mouth 2 (two) times daily. 07/07/21   Thurnell Lose, MD  promethazine (PHENERGAN) 12.5 MG tablet Take 12.5 mg by mouth every 6 (six) hours as needed for nausea or vomiting.    [provider]  rOPINIRole (REQUIP) 2 MG tablet Take 2 mg by mouth 2 (two) times daily.    [provider]  rosuvastatin (CRESTOR) 10 MG tablet Take 10 mg by mouth at bedtime.    [provider]  senna-docusate (SENOKOT-S) 8.6-50 MG tablet Take 1 tablet by mouth at bedtime.    [provider]  SPIRIVA RESPIMAT 1.25 MCG/ACT AERS Inhale 2 each into the lungs in the morning.    [provider]  tamsulosin (FLOMAX) 0.4 MG CAPS capsule Take 0.8 mg by mouth daily. 11/26/13   [provider]  tiZANidine (ZANAFLEX) 4 MG tablet Take 4 mg by mouth every 6 (six) hours as needed for muscle spasms.    [provider]  triamcinolone cream (KENALOG) 0.1 % Apply 1  application  topically 2 (two) times daily. Apply to left lower leg    [provider]  TRULICITY [id number] SOPN Inject 0.75 mg into the skin every Wednesday.    [provider]      Allergies    Patient has no known allergies.    Review of Systems   Review of Systems  Physical Exam Updated Vital Signs BP 97/75   Pulse (!) 132   Temp (!) 103.1 F (39.5 C)   Resp 19   Ht '5\' 7"'$  (1.702 m)   Wt 86.2 kg   SpO2 97%   BMI 29.76 kg/m  Physical Exam Vitals and nursing note reviewed.  Constitutional:      General: He is in acute distress.     Appearance: He is well-developed. He is obese. He is ill-appearing.     Comments: Groaning during the evaluation complaining of diffuse pain  HENT:     Head: Normocephalic and atraumatic.     Right Ear: External ear normal.     Left Ear: External ear normal.     Nose: Nose normal.  Eyes:     Extraocular Movements: Extraocular movements intact.     Conjunctiva/sclera: Conjunctivae normal.     Pupils: Pupils are equal, round, and reactive to light.  Cardiovascular:     Rate and Rhythm: Regular rhythm. Tachycardia present.     Heart sounds: Normal heart sounds.  Pulmonary:     Effort: Pulmonary effort is normal. No respiratory distress.     Breath sounds: Normal breath sounds.     Comments: Satting in the mid to low 90s on room air Abdominal:     General: There is no distension.     Palpations: Abdomen is soft. There is no mass.     Tenderness: There is no abdominal tenderness. There is no guarding.  Genitourinary:    Comments: No sacral wounds noted.  No erythema or crepitance of the perineum. Musculoskeletal:     Cervical back: Normal range of motion and neck supple.     Left lower leg: Edema present.     Comments: Right lower extremity amputation.  Left lower extremity with lymphedema and erythema of the entire leg.  Does have superficial ulcers of the left lower extremity anteriorly on the dorsum of the foot and  posteriorly just superior to the heel.  See images below.  Skin:    General: Skin is warm and dry.  Neurological:     Mental Status: He is alert.     Comments: Patient moves all 3 extremities on command.  Cranial nerves II through XII grossly intact.  Oriented to person, place, and knew the year was 2024 but was unsure of the month.  Psychiatric:     Comments: Anxious appearing    Left lower extremity dorsum of foot   Left lower extremity heel     ED Results / Procedures / Treatments   Labs (all labs ordered are listed, but only abnormal results are displayed) Labs Reviewed  RESP PANEL BY RT-PCR (RSV, FLU A&B, COVID)  RVPGX2 - Abnormal; Notable for the following components:      Result Value   Influenza A by PCR POSITIVE (*)    All other components within normal limits  LACTIC ACID, PLASMA - Abnormal; Notable for the following components:   Lactic Acid, Venous 2.6 (*)    All other components within normal limits  COMPREHENSIVE METABOLIC PANEL - Abnormal; Notable for the following components:   CO2 18 (*)    Glucose, Bld 187 (*)    BUN 57 (*)    Creatinine, Ser 3.87 (*)    Calcium 8.5 (*)    Albumin 2.8 (*)    AST 48 (*)    GFR, Estimated 17 (*)    Anion gap 17 (*)    All other components within normal limits  CBC WITH DIFFERENTIAL/PLATELET - Abnormal; Notable for the following components:   WBC 15.5 (*)    RBC 3.83 (*)    Hemoglobin 10.0 (*)    HCT 32.9 (*)    RDW 15.6 (*)    Neutro Abs 13.9 (*)    Abs Immature Granulocytes 0.19 (*)    All other components within normal limits  APTT - Abnormal; Notable for the following components:   aPTT 39 (*)    All other components within normal limits  URINALYSIS, ROUTINE W REFLEX MICROSCOPIC - Abnormal; Notable for the following components:   Color, Urine AMBER (*)    Protein, ur 100 (*)    All other components within normal limits  BRAIN NATRIURETIC PEPTIDE - Abnormal; Notable for the following components:   B  Natriuretic Peptide 171.8 (*)    All other components within normal limits  CBG MONITORING, ED - Abnormal; Notable for the following components:   Glucose-Capillary 189 (*)    All other components within normal limits  CULTURE, BLOOD (ROUTINE X 2)  CULTURE, BLOOD (ROUTINE X 2)  URINE CULTURE  PROTIME-INR  LACTIC ACID, PLASMA  BLOOD GAS, ARTERIAL  HIV ANTIBODY (ROUTINE TESTING W REFLEX)  CBC  CREATININE, SERUM    EKG EKG Interpretation  Date/Time:  Wednesday August 22 2022 09:43:39 EST Ventricular Rate:  103 PR Interval:  169 QRS Duration: 108 QT Interval:  334 QTC Calculation: 438 R Axis:   106 Text Interpretation: Sinus tachycardia Right axis deviation Confirmed by Regan Lemming ([id number]) on 08/22/2022 9:53:41 AM  Radiology DG Chest Port 1 View  Result Date: 08/22/2022 CLINICAL DATA:  Questionable sepsis EXAM: PORTABLE CHEST 1 VIEW COMPARISON:  Radiograph 05/01/2022 FINDINGS: Stable cardiac silhouette. There is patchy bilateral airspace disease more dense on the  LEFT. No pneumothorax. Anterior posterior cervical fusion IMPRESSION: Bilateral airspace disease representing multifocal pneumonia versus pulmonary edema. Electronically Signed   By: Suzy Bouchard M.D.   On: 08/22/2022 10:00    Procedures Procedures    EMERGENCY DEPARTMENT [place] CARDIAC EXAM "Study: Limited Ultrasound of the Heart and Pericardium"  INDICATIONS:Dyspnea Multiple views of the heart and pericardium were obtained in real-time with a multi-frequency probe.  PERFORMED [id number] IMAGES ARCHIVED?: No LIMITATIONS:  Body habitus VIEWS USED: Subcostal 4 chamber, Parasternal long axis, Parasternal short axis, and Apical 4 chamber  INTERPRETATION: Cardiac activity present, Pericardial effusion present, Amount of pericardial effusion small, Cardiac tamponade absent, Increased contractility, and IVC flat   Medications Ordered in ED Medications  lactated ringers infusion (has no administration in time range)   ceFEPIme (MAXIPIME) 2 g in sodium chloride 0.9 % 100 mL IVPB (has no administration in time range)  vancomycin variable dose per unstable renal function (pharmacist dosing) (has no administration in time range)  oseltamivir (TAMIFLU) capsule 30 mg (has no administration in time range)  fentaNYL (SUBLIMAZE) injection 25 mcg (25 mcg Intravenous Given 08/22/22 1226)  oxyCODONE (Oxy IR/ROXICODONE) immediate release tablet 5 mg (has no administration in time range)  docusate sodium (COLACE) capsule 100 mg (has no administration in time range)  polyethylene glycol (MIRALAX / GLYCOLAX) packet 17 g (has no administration in time range)  heparin injection 5,000 Units (has no administration in time range)  lactated ringers bolus 1,000 mL (1,000 mLs Intravenous New Bag/Given 08/22/22 0953)  metroNIDAZOLE (FLAGYL) IVPB 500 mg (0 mg Intravenous Stopped 08/22/22 1042)  acetaminophen (TYLENOL) tablet 1,000 mg (1,000 mg Oral Given 08/22/22 0955)  ceFEPIme (MAXIPIME) 2 g in sodium chloride 0.9 % 100 mL IVPB (0 g Intravenous Stopped 08/22/22 1042)  vancomycin (VANCOREADY) IVPB 1750 mg/350 mL (0 mg Intravenous Stopped 08/22/22 1226)  ipratropium-albuterol (DUONEB) 0.5-2.5 (3) MG/3ML nebulizer solution 3 mL (3 mLs Nebulization Given 08/22/22 1001)  oxyCODONE (Oxy IR/ROXICODONE) immediate release tablet 5 mg (5 mg Oral Given 08/22/22 0955)  oxyCODONE (Oxy IR/ROXICODONE) immediate release tablet 10 mg (10 mg Oral Given 08/22/22 1053)  albumin human 25 % solution 25 g (25 g Intravenous New Bag/Given 08/22/22 1147)    ED Course/ Medical Decision Making/ A&P Clinical Course as of 08/22/22 1311  Wed Aug 22, 2022  0920 Tried to reach patient's facility ([facility]) but unable to get a response from [id number]. [RP]  [phone] Spoke with facility. Was complaining of hurting all over. Has had a cough recently. Had a flu swab yesterday that was negative. Concerned about withdrawals since his oxycodone was decreased. Did have  some congestion. Was getting 30 mg TID now on 20 mg TID of oxycodone.  [RP]  [id number] Full code verified by daughter Joellen [place].  [RP]  1227 Spoke with Mickel Baas Gleason PA from the ICU regarding the patient and she will come evaluate shortly. [RP]  1238 Dr. Roosevelt Locks from hospitalist has evaluated the patient and feels that he may be too unstable for stepdown. [RP]    Clinical Course User Index [RP] Fransico Meadow, MD                             Medical Decision Making Amount and/or Complexity of Data Reviewed Labs: ordered.  Risk OTC drugs. Prescription drug management. Decision regarding hospitalization.   PRADEEP KEIRSEY is a 62 y.o. male with comorbidities that complicate the patient evaluation including diabetes, hypertension, hyperlipidemia,  heart failure, COPD not on home oxygen, CKD, and diabetic foot ulcer who presents to the emergency department due to concerns for sepsis  Initial Ddx:  Sepsis, septic shock, pneumonia, URI, UTI, left lower extremity cellulitis, osteomyelitis  MDM:  Differential is broad but appears that he may be septic from an unknown source at this time.  Does have multiple complaints including cough, dysuria, and left lower extremity pain with an open wound.  These could all be potential sources for him so will initiate on broad-spectrum antibiotics at this time.  Unable to probe to bone on exam so while osteomyelitis could be a possibility feel that is less likely at this time.  No signs of necrotizing fasciitis.  With his heart failure will give 1 L of fluids and Tylenol and reassess.  Plan:  Labs Blood cultures Lactate BNP Urinalysis COVID and flu Chest x-ray EKG IV fluids  ED Summary/Re-evaluation:  Patient reassessed and had soft blood pressures.  Remained tachycardic and febrile despite the Tylenol and IV fluids.  Point-of-care ultrasound was performed which showed hyperdynamic LV function with collapsible IVC.  Was also found to have AKI on his labs.   Initially was concerned about either heart failure exacerbation with his elevated BNP and chest x-ray findings versus atypical pneumonia.  Was started on BiPAP for increased work of breathing and appeared much more comfortable afterwards.  With his point-of-care ultrasound showing hyperdynamic LV and AKI feel the patient may be volume down so was given 1 L and with his low albumin was given a dose of IV albumin in the emergency department.  This was also performed so that if he became volume overloaded we could use a push pull method to draw out any interstitial edema.  Patient was found to be influenza positive and was started on Tamiflu.  Felt to be too unstable for stepdown unit and was admitted to the ICU for further management.  This patient presents to the ED for concern of complaints listed in HPI, this involves an extensive number of treatment options, and is a complaint that carries with it a high risk of complications and morbidity. Disposition including potential need for admission considered.   Dispo: ICU  Additional history obtained from daughter Records reviewed Outpatient Clinic Notes The following labs were independently interpreted: Chemistry and show AKI I independently reviewed the following imaging with scope of interpretation limited to determining acute life threatening conditions related to emergency care: Chest x-ray and agree with the radiologist interpretation with the following exceptions: None I personally reviewed and interpreted cardiac monitoring: sinus tachycardia I personally reviewed and interpreted the pt's EKG: see above for interpretation  I have reviewed the patients home medications and made adjustments as needed Consults: Critical care and Hospitalist Social Determinants of health:  SNF resident  Final Clinical Impression(s) / ED Diagnoses Final diagnoses:  Sepsis, due to unspecified organism, unspecified whether acute organ dysfunction present ([place])   Acute on chronic respiratory failure with hypoxia ([place])  Influenza  Acute on chronic heart failure, unspecified heart failure type ([place])    Rx / DC Orders ED Discharge Orders     None      CRITICAL CARE Performed by: Fransico Meadow   Total critical care time: 90 minutes  Critical care time was exclusive of separately billable procedures and treating other patients.  Critical care was necessary to treat or prevent imminent or life-threatening deterioration.  Critical care was time spent personally by me on the following activities: development  of treatment plan with patient and/or surrogate as well as nursing, discussions with consultants, evaluation of patient's response to treatment, examination of patient, obtaining history from patient or surrogate, ordering and performing treatments and interventions, ordering and review of laboratory studies, ordering and review of radiographic studies, pulse oximetry and re-evaluation of patient's condition.    Fransico Meadow, MD 08/22/22 1311

## 2022-08-22 NOTE — Progress Notes (Signed)
Respiratory therapist notified about pt needing his treatment, noted. Rt stated she will be here shortly, noted. Call bell in reach. Pt will not go to MRI until he see RT, noted. Pt is currently on 4 liters of oxygen via [place] and SATs at at 93%. Pt is stable.

## 2022-08-22 NOTE — ED Notes (Signed)
Pt tolerating BIPAP

## 2022-08-22 NOTE — ED Notes (Signed)
Ice bags replaced

## 2022-08-22 NOTE — Progress Notes (Addendum)
Pharmacy Antibiotic Note  Gregory [name] is a 62 y.o. male admitted on 08/22/2022 with sepsis.  Pharmacy has been consulted for cefepime and vancomcyin dosing.Suspected source cellulitis, patient with hx of Strep bacteremia in 2023. Patient also had ESBL UTI approx. 3 years ago. With significant AKI, Scr: 3.87, baseline ~~ 1.8-2.0.   Addendum  Changing cefepime to zosyn to cover for anaerobes. CrCl>20 Plan: Dc cefepime Zosyn 3.375g IV x1 then q8 over 4 hrs Vancomycin '1750mg'$  x1, then dose based on random levels pendign renal recovery.  Follow culture data for de-escalation.    Height: '5\' 7"'$  (170.2 cm) Weight: 86.2 kg (190 lb) IBW/kg (Calculated) : 66.1  Temp (24hrs), Avg:103.9 F (39.9 C), Min:102.5 F (39.2 C), Max:104.5 F (40.3 C)  Recent Labs  Lab 08/22/22 0907  WBC 15.5*  CREATININE 3.87*    Estimated Creatinine Clearance: 20.5 mL/min (A) (by C-G formula based on SCr of 3.87 mg/dL (H)).    No Known Allergies  Onnie Boer, PharmD, BCIDP, AAHIVP, CPP Infectious Disease Pharmacist 08/22/2022 7:03 PM

## 2022-08-22 NOTE — Consult Note (Signed)
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 Patient has been seen by [name] in the past, that provider performed the amputation of the RLE in 03/2021 and left foot, digit amputations.  Also seen by Vascular (Dr. Scot Dock). Wound type: Full thickness, venous insufficiency, lymphedema Pressure Injury POA:N/A Measurement: To be obtained by the Bedside RN with next dressing change Wound bed: See photodocumentation of wounds provided by EDP Drainage (amount, consistency, odor) small to moderate, serous Periwound: erythematous, edematous Dressing procedure/placement/frequency: I have provided Nursing with conservative care guidance for the care of the LLE wounds using a soap and water cleanse, rinse and dry followed by covering the lesions with antimicrobial nonadherent gauze (Xeroform) topped with dry gauze, ABD pads and secured with Kerlix roll gauze/paper tape. The foot is then to be placed into a pressure redistribution heel boot (Prevalon). A sacral foam is to be placed for pressure injury prevention.   Recommend consultation with Drs. [name] and [place] while in house if other interventions/further evaluations are desired.  [facility] nursing team will not follow, but will remain available to this patient, the nursing and medical teams.  Please re-consult if needed.  Thank you for inviting [place] to participate in this patient's Plan of Care.  Maudie Flakes, MSN, RN, CNS, [place], Serita Grammes, [employer], [employer] phone:  [phone]

## 2022-08-22 NOTE — Consult Note (Signed)
NAME:  Gregory [name], MRN:  [id number], DOB:  01-Jul-1960, LOS: 0 ADMISSION DATE:  08/22/2022, CONSULTATION DATE:  08/22/22 REFERRING MD:  Sharlett Iles, CHIEF COMPLAINT:  fever   History of Present Illness:  Gregory [name] is a 62 y.o. 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  He denies chest pain, abdominal pain, nausea or vomiting.  The LLE has a chronic wound, but pt has noted some increased erythema and swelling extending up the thigh.    In the ED required nasal cannula O2, but had worsening oxygen saturations, so Bipap initiated.   Work up significant for fever up to 104F, borderline low BP and, lactic acid of 2.5, creatinine 3.87 up from baseline of ~2, WBC 15, BNP 171, ABG on bipap with respiratory acidosis.   He was given 1L IVF, Vancomycin, cefepime and flagyl and PCCM consulted for admission.  He was also positive for influenza A; states this has been going around his assisted living center  Pertinent  Medical History   has a past medical history of Acquired absence of left great toe [facility]), Acquired absence of right leg below knee [facility]), Acute osteomyelitis of calcaneum, right ([facility]) (10/02/2016), Acute respiratory failure ([place]), Amputation stump infection ([place]) (08/12/2018), Anemia, Basal cell carcinoma, eyelid, Cancer ([place]), Candidiasis (08/12/2018), CHF (congestive heart failure) (HCC), Chronic back pain, Chronic kidney disease, Chronic multifocal osteomyelitis ([place]), Chronic pain syndrome, COPD (chronic obstructive pulmonary disease) ([place]), DDD (degenerative disc disease), lumbar, Depression, Diabetes mellitus without complication ([place]), Diabetic ulcer of toe of left foot ([place]) (10/02/2016), Difficult intubation, Difficulty in walking, not elsewhere classified, 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), Foot drop,  bilateral, Foot drop, right (12/27/2015), GERD (gastroesophageal reflux disease), Herpesviral vesicular dermatitis, History of COVID-19 (03/15/2022), Hyperlipidemia, Hyperlipidemia, Hypertension, Insomnia, Malignant melanoma of other parts of face ([place]), Melanoma ([place]), MRSA bacteremia, Muscle weakness (generalized), Nasal congestion, Necrosis of toe ([place]) (07/08/2018), Neuromuscular dysfunction of bladder, Osteomyelitis ([place]), Osteomyelitis ([place]), Osteomyelitis ([place]), Other idiopathic peripheral autonomic neuropathy, Retention of urine, unspecified, Squamous cell carcinoma of skin, Unsteadiness on feet, Urinary retention, Urinary retention, Urinary tract infection, Wears dentures, and Wears glasses.   Significant Hospital Events: Including procedures, antibiotic start and stop dates in addition to other pertinent events   3/6 presented to ED with fever and flu A, on Bipap  Interim History / Subjective:  Pt remained alert and oriented, tolerating bipap mask and then was transitioned to 2L [place]  Objective   Blood pressure 97/75, pulse (!) 132, temperature (!) 103.1 F (39.5 C), resp. rate 19, height '5\' 7"'$  (1.702 m), weight 86.2 kg, SpO2 97 %.    FiO2 (%):  [60 %] 60 %  No intake or output data in the 24 hours ending 08/22/22 1238 Filed Weights   08/22/22 0853  Weight: 86.2 kg    General:  chronically and acutely ill-appearing M, resting in bed in no acute distress HEENT: MM pink/moist, bipap mask in place  Neuro: awake, alert and oriented and following commands CV: s1s2 rrr, no m/r/g PULM:  on bipap with good air movement bilaterally, slight expiratory wheeze and few scattered basilar crackles, no distress GI: soft, obese, non-tender  Extremities: warm/dry, RLE s/p AKA, LLE with chronic lymphedema and venous stasis ulceration with mild medial erythema and warmth as below    Skin:  see image below      Resolved Hospital Problem list    Assessment & Plan:    Acute Hypoxic Respiratory  Failure secondary to Influenza A pneumonia Sepsis  Baseline COPD and tobacco use  He was initially on bipap with borderline low BP, improved and was transitioned to 2L [place]  -continue supplemental O2 with Bipap prn -start tamiflu -continue antibiotics with doxycycline and ceftriaxone  -appears dry, additional 500cc IVF bolus -check urine strep and legionella  -prednisone '40mg'$  qd -trend lactic acid    Acute on chronic renal failure, CKD IIIb Likely pre-renal and secondary to volume depletion -IVF, monitor BMP and UOP -avoid nephrotoxins and renally dose medications    HTN HL HFpEF -hold home anti-hypertensives and lasix   Chronic lymphedema with possible mild LLE cellulitis  Chronic pain  -ceftriaxone and doxycycline -WOC consult -continue home oxy  Type 2 DM -SSI -hold home lantus for now and start SSI  Best Practice (right click and "Reselect all SmartList Selections" daily)   Diet/type: Regular consistency (see orders) DVT prophylaxis: prophylactic heparin  GI prophylaxis: N/A Lines: N/A Foley:  N/A Code Status:  full code Last date of multidisciplinary goals of care discussion [pending, confirmed full code ]  Labs   CBC: Recent Labs  Lab 08/22/22 0907  WBC 15.5*  NEUTROABS 13.9*  HGB 10.0*  HCT 32.9*  MCV 85.9  PLT A999333    Basic Metabolic Panel: Recent Labs  Lab 08/22/22 0907  NA 137  K 4.7  CL 102  CO2 18*  GLUCOSE 187*  BUN 57*  CREATININE 3.87*  CALCIUM 8.5*   GFR: Estimated Creatinine Clearance: 20.5 mL/min (A) (by C-G formula based on SCr of 3.87 mg/dL (H)). Recent Labs  Lab 08/22/22 0907  WBC 15.5*  LATICACIDVEN 2.6*    Liver Function Tests: Recent Labs  Lab 08/22/22 0907  AST 48*  ALT 24  ALKPHOS 48  BILITOT 0.7  PROT 6.8  ALBUMIN 2.8*   No results for input(s): "LIPASE", "AMYLASE" in the last 168 hours. No results for input(s): "AMMONIA" in the last 168 hours.  ABG    Component Value Date/Time   PHART 7.35  01/24/2022 1800   PCO2ART 44 01/24/2022 1800   PO2ART 67 (L) 01/24/2022 1800   HCO3 24.3 01/24/2022 1800   TCO2 26 06/26/2021 0203   ACIDBASEDEF 1.5 01/24/2022 1800   O2SAT 95.3 01/24/2022 1800     Coagulation Profile: Recent Labs  Lab 08/22/22 0907  INR 1.1    Cardiac Enzymes: No results for input(s): "CKTOTAL", "CKMB", "CKMBINDEX", "TROPONINI" in the last 168 hours.  HbA1C: Hemoglobin A1C  Date/Time Value Ref Range Status  08/14/2016 12:00 AM 5.6  Final  01/18/2016 12:00 AM 5.5  Final   Hgb A1c MFr Bld  Date/Time Value Ref Range Status  05/01/2022 08:13 AM 6.9 (H) 4.8 - 5.6 % Final    Comment:    (NOTE) Pre diabetes:          5.7%-6.4%  Diabetes:              >6.4%  Glycemic control for   <7.0% adults with diabetes   09/06/2021 07:40 AM 7.5 (H) 4.8 - 5.6 % Final    Comment:    (NOTE) Pre diabetes:          5.7%-6.4%  Diabetes:              >6.4%  Glycemic control for   <7.0% adults with diabetes  CBG: Recent Labs  Lab 08/22/22 1021  GLUCAP 189*    Review of Systems:   Please see the history of present illness. All other systems reviewed and are negative    Past Medical History:  He,  has a past medical history of Acquired absence of left great toe ([place]), Acquired absence of right leg below knee [facility]), Acute osteomyelitis of calcaneum, right ([place]) (10/02/2016), Acute respiratory failure ([place]), Amputation stump infection ([place]) (08/12/2018), Anemia, Basal cell carcinoma, eyelid, Cancer ([place]), Candidiasis (08/12/2018), CHF (congestive heart failure) (HCC), Chronic back pain, Chronic kidney disease, Chronic multifocal osteomyelitis ([place]), Chronic pain syndrome, COPD (chronic obstructive pulmonary disease) ([place]), DDD (degenerative disc disease), lumbar, Depression, Diabetes mellitus without complication ([place]), Diabetic ulcer of toe of left foot ([place]) (10/02/2016), Difficult intubation, Difficulty in walking, not elsewhere classified, Epidural abscess  (10/02/2016), Foot drop, bilateral, Foot drop, right (12/27/2015), GERD (gastroesophageal reflux disease), Herpesviral vesicular dermatitis, History of COVID-19 (03/15/2022), Hyperlipidemia, Hyperlipidemia, Hypertension, Insomnia, Malignant melanoma of other parts of face ([place]), Melanoma ([place]), MRSA bacteremia, Muscle weakness (generalized), Nasal congestion, Necrosis of toe ([place]) (07/08/2018), Neuromuscular dysfunction of bladder, Osteomyelitis ([place]), Osteomyelitis ([place]), Osteomyelitis ([place]), Other idiopathic peripheral autonomic neuropathy, Retention of urine, unspecified, Squamous cell carcinoma of skin, Unsteadiness on feet, Urinary retention, Urinary retention, Urinary tract infection, Wears dentures, and Wears glasses.   Surgical History:   Past Surgical History:  Procedure Laterality Date   AMPUTATION Left 03/29/2017   Procedure: LEFT GREAT TOE AMPUTATION AT METATARSOPHALANGEAL JOINT;  Surgeon: [name], MD;  Location: [place];  Service: Orthopedics;  Laterality: Left;   AMPUTATION Right 03/29/2017   Procedure: RIGHT BELOW KNEE AMPUTATION;  Surgeon: [name], MD;  Location: [place];  Service: Orthopedics;  Laterality: Right;   AMPUTATION Left 06/06/2018   Procedure: LEFT 2ND TOE AMPUTATION;  Surgeon: [name], MD;  Location: [place];  Service: Orthopedics;  Laterality: Left;   AMPUTATION Right 11/19/2018   Procedure: AMPUTATION ABOVE KNEE;  Surgeon: [name], MD;  Location: [place];  Service: Orthopedics;  Laterality: Right;   ANTERIOR CERVICAL CORPECTOMY N/A 11/25/2015   Procedure: ANTERIOR CERVICAL FIVE CORPECTOMY Cervical four - six fusion;  Surgeon: Consuella Lose, MD;  Location: [place] [facility];  Service: Neurosurgery;  Laterality: N/A;  ANTERIOR CERVICAL FIVE CORPECTOMY Cervical four - six fusion   APPLICATION OF WOUND VAC Right 10/22/2018   Procedure: Application Of Wound Vac;  Surgeon: [name], MD;  Location: [place];  Service: Orthopedics;  Laterality: Right;    CYSTOSCOPY WITH BIOPSY N/A 05/01/2022   Procedure: CYSTOSCOPY WITH URETHRAL BIOPSY;  Surgeon: Robley Fries, MD;  [facility];  Service: Urology;  Laterality: N/A;  1 HR   CYSTOSCOPY WITH RETROGRADE URETHROGRAM N/A 04/25/2018   Procedure: CYSTOSCOPY WITH RETROGRADE URETHROGRAM/ BALLOON DILATION;  Surgeon: Kathie Rhodes, MD;  [facility];  Service: Urology;  Laterality: N/A;   CYSTOSCOPY WITH URETHRAL DILATATION N/A 05/01/2022   Procedure: BALLOON DILATION WITH  OPTILUME;  Surgeon: Robley Fries, MD;  [facility];  Service: Urology;  Laterality: N/A;   MULTIPLE TOOTH EXTRACTIONS     POSTERIOR CERVICAL FUSION/FORAMINOTOMY N/A 11/29/2015   Procedure: Cervical Three-Cervical Seven Posterior Cervical Laminectomy with Fusion;  Surgeon: Consuella Lose, MD;  Location: [place] [facility];  Service: Neurosurgery;  Laterality: N/A;  Cervical Three-Cervical Seven Posterior Cervical Laminectomy with Fusion   STUMP REVISION Right 10/22/2018   Procedure: REVISION RIGHT BELOW KNEE AMPUTATION;  Surgeon: [name], MD;  Location[facility];  Service: Orthopedics;  Laterality: Right;   STUMP REVISION Right 12/05/2018   Procedure: Revision Right Above Knee Amputation;  Surgeon: [name], MD;  Location: [place];  Service: Orthopedics;  Laterality: Right;   STUMP REVISION Right 05/29/2019   Procedure: REVISION RIGHT ABOVE KNEE AMPUTATION;  Surgeon: [name], MD;  Location: [place];  Service: Orthopedics;  Laterality: Right;   STUMP REVISION Right 06/24/2019   Procedure: STUMP REVISION;  Surgeon: [name], MD;  Location: [place];  Service: Orthopedics;  Laterality: Right;     Social History:   reports that he has been smoking cigarettes. He has been smoking an average of .5 packs per day. He has never used smokeless tobacco. He reports that he does not drink alcohol and does not use drugs.   Family History:  His family history includes Bone cancer in his father; Cancer in his father,  mother, paternal grandfather, and another family member; Diabetes in his mother and another family member; Heart disease in his mother; Prostate cancer in his father.   Allergies No Known Allergies   Home Medications  Prior to Admission medications   Medication Sig Start Date End Date Taking? Authorizing Provider  cetirizine (ZYRTEC) 10 MG tablet Take 10 mg by mouth daily.    [provider]  cyanocobalamin 1000 MCG tablet Take 1,000 mcg by mouth daily.    [provider]  diclofenac Sodium (VOLTAREN) 1 % GEL Apply 4 g topically every 8 (eight) hours as needed (for pain). On right leg    [provider]  docusate sodium (COLACE) 100 MG capsule Take 1 capsule (100 mg total) by mouth 2 (two) times daily. 05/31/19   Amin, Jeanella Flattery, MD  eszopiclone (LUNESTA) 2 MG TABS tablet Take 2 mg by mouth at bedtime. Take immediately before bedtime    [provider]  ferrous sulfate 325 (65 FE) MG tablet Take 1 tablet (325 mg total) by mouth daily with breakfast. 11/23/18   Matilde Haymaker, MD  fluticasone [facility]) 50 MCG/ACT nasal spray Place 1 spray into both nostrils daily.    [provider]  furosemide (LASIX) 20 MG tablet Take 20-40 mg by mouth See admin instructions. Take '40mg'$  at 9am and '20mg'$  at 4pm.    [provider]  gabapentin (NEURONTIN) 300 MG capsule Take 300 mg by mouth 3 (three) times daily.    [provider]  GLUCAGON HCL IJ Inject 1 mg into the muscle once as needed (hypoglycemia).    [provider]  guaiFENesin 200 MG tablet Take 200 mg by mouth every 6 (six) hours as needed (congestion).    [provider]  hydrOXYzine (ATARAX) 25 MG tablet Take 25 mg by mouth every 6 (six) hours as needed for itching or anxiety.    [provider]  Infant Care Products [facility]) OINT Apply 1 Application topically in the morning and at bedtime. To buttocks    [provider]  insulin glargine (LANTUS  SOLOSTAR) 100 UNIT/ML Solostar Pen Inject 12 Units into the skin daily. Patient taking differently: Inject 12 Units into the skin 2 (two) times daily. 01/31/22   Dahal, Marlowe Aschoff, MD  insulin lispro (HUMALOG) 100 UNIT/ML KwikPen Inject 0-10 Units into the skin See admin instructions. Inject 0-10 units into the skin three times a day and at bedtime, PER SLIDING SCALE:  BGL 151-200 = 2 units 201-250 = 4 units 251-300 = 6 units 301-350 =  8 units 351-400 = 10  units 01/31/22   Dahal, Marlowe Aschoff, MD  ipratropium-albuterol (DUONEB) 0.5-2.5 (3) MG/3ML SOLN Take 3 mLs by nebulization every 6 (six) hours as needed (Shortness of breath).    [provider]  lactulose (CHRONULAC) 10 GM/15ML solution Take 15 mLs (10 g total) by mouth 3 (three) times daily as needed for moderate constipation. Patient taking differently: Take 10 g by mouth 3 (three) times daily. 01/31/22   Terrilee Croak, MD  levalbuterol (XOPENEX) 0.63 MG/3ML nebulizer solution Take 3 mLs (0.63 mg total) by nebulization every 6 (six) hours as needed for wheezing or shortness of breath. 01/31/22   Terrilee Croak, MD  Lidocaine 4 % PTCH Apply 2 patches topically daily. Apply one patch to right shoulder at 9am and remove at 9pm. Apply a second patch to Right stump at 9am and remove at 9pm.    [provider]  magnesium oxide (MAG-OX) 400 MG tablet Take 400 mg by mouth 2 (two) times daily.    [provider]  melatonin 3 MG TABS tablet Take 3 mg by mouth at bedtime.    [provider]  Menthol, Topical Analgesic, (BIOFREEZE) 4 % GEL Apply 1 Application topically every 8 (eight) hours as needed (for pain). Apply to right leg/stump    [provider]  Meth-Hyo-M Bl-Na Phos-Ph Sal (URIBEL) 118 MG CAPS Take 1 capsule by mouth every 8 (eight) hours as needed (bladder pain).    [provider]  methenamine (MANDELAMINE) 1 g tablet Take 1 g by mouth daily.    [provider]  metoprolol tartrate  (LOPRESSOR) 25 MG tablet Take 0.5 tablets (12.5 mg total) by mouth 2 (two) times daily. 07/18/19 09/06/22  Elodia [place]., MD  mirabegron ER (MYRBETRIQ) 50 MG TB24 tablet Take 50 mg by mouth daily.    [provider]  Multiple Vitamins-Minerals (MULTIVITAMIN WITH MINERALS) tablet Take 1 tablet by mouth daily.    [provider]  naloxone [facility]) 4 MG/0.1ML LIQD nasal spray kit Place 1 spray into the nose as needed (poorly responsive or turning blue).    [provider]  nystatin cream (MYCOSTATIN) Apply 1 Application topically See admin instructions. Apply to gluteal fold every day shift for wound care. May apply as needed as well.    [provider]  omeprazole (PRILOSEC) 20 MG capsule Take 20 mg by mouth at bedtime.    [provider]  ondansetron (ZOFRAN) 4 MG tablet Take 4 mg by mouth every 8 (eight) hours as needed for nausea.    [provider]  oxybutynin (DITROPAN-XL) 10 MG 24 hr tablet Take 10 mg by mouth daily.    [provider]  oxyCODONE (OXY IR/ROXICODONE) 5 MG immediate release tablet Take 1 tablet (5 mg total) by mouth every 8 (eight) hours as needed for severe pain (pain). Patient taking differently: Take 5 mg by mouth every 8 (eight) hours as needed (pain). 09/08/21   Charlynne Cousins, MD  oxycodone (ROXICODONE) 30 MG immediate release tablet Take 1 tablet (30 mg total) by mouth every 8 (eight) hours. 09/08/21   Charlynne Cousins, MD  OXYGEN Inhale 2 L into the lungs as needed (for shortness of breath).    [provider]  oxymetazoline (AFRIN 12 HOUR) 0.05 % nasal spray Place 1 spray into both nostrils 2 (two) times daily as needed for congestion.    [provider]  phenazopyridine (PYRIDIUM) 100 MG tablet Take 100 mg by mouth every 8 (eight) hours as needed (  dysuria).    [provider]  Polyethyl Glycol-Propyl Glycol (LUBRICANT EYE DROPS) 0.4-0.3 % SOLN Place 2 drops into both  eyes every 4 (four) hours as needed (dryness/itching).    [provider]  polyethylene glycol (MIRALAX / GLYCOLAX) 17 g packet Take 17 g by mouth daily.    [provider]  pregabalin (LYRICA) 150 MG capsule Take 1 capsule (150 mg total) by mouth 2 (two) times daily. 07/07/21   Thurnell Lose, MD  promethazine (PHENERGAN) 12.5 MG tablet Take 12.5 mg by mouth every 6 (six) hours as needed for nausea or vomiting.    [provider]  rOPINIRole (REQUIP) 2 MG tablet Take 2 mg by mouth 2 (two) times daily.    [provider]  rosuvastatin (CRESTOR) 10 MG tablet Take 10 mg by mouth at bedtime.    [provider]  senna-docusate (SENOKOT-S) 8.6-50 MG tablet Take 1 tablet by mouth at bedtime.    [provider]  SPIRIVA RESPIMAT 1.25 MCG/ACT AERS Inhale 2 each into the lungs in the morning.    [provider]  tamsulosin (FLOMAX) 0.4 MG CAPS capsule Take 0.8 mg by mouth daily. 11/26/13   [provider]  tiZANidine (ZANAFLEX) 4 MG tablet Take 4 mg by mouth every 6 (six) hours as needed for muscle spasms.    [provider]  triamcinolone cream (KENALOG) 0.1 % Apply 1 application  topically 2 (two) times daily. Apply to left lower leg    [provider]  TRULICITY [id number] [id number] SOPN Inject 0.75 mg into the skin every Wednesday.    [provider]     Critical care time: n/a     Otilio Carpen Julianna Vanwagner, [name] [facility] See Amion for pager If no response to pager , please call [phone] until 7pm After 7:00 pm call Elink  [phone]?[place]

## 2022-08-22 NOTE — ED Triage Notes (Signed)
Pt arrived via ems from nursing home for possible sepsis Pt is febrile right aka and left foot swelling with drainage

## 2022-08-22 NOTE — H&P (Addendum)
History and Physical    Gregory [name] [name] DOB: [birth year] DOA: 08/22/2022  PCP: [name], MD (Confirm with patient/family/NH records and if not entered, this has to be entered at [facility] point of entry) Patient coming from: SNF  I have personally briefly reviewed patient's old medical records in [place]  Chief Complaint: Left leg pain and rash, wheezing and cough  HPI: Gregory [name] is a 62 y.o. 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  Patient continues to smoke and to 4 days ago he started to have wheezing but no cough no chest pain no fever or chills.  Patient has a chronic left leg diabetic on left lower side, shin area and ankle area with chronic shallow ulcers and thin discharge, according to patient he gets only intermittent wound care.  2 days ago, patient started to have rash and pain which is new.  He started to have episode of subjective fever and chills.  And today, patient was found to have a new onset of fever and SNF staff decided to send him to ED.  Nauseous vomiting no abdominal pain no diarrhea.  ED Course: Spiking fever 103, tachycardia heart rate in the 130s, blood pressure 91/52 on arrival O2 sat ration 85% on 2 L.  Chest x-ray showed multifocal pneumonia.  Blood work showed WBC 15, hemoglobin 10.0, 18, creatinine is 7 compared to baseline 1.8-2.0.  UA showed no signs of UTI  Started on cefepime and vancomycin.  Was on BiPAP for about 2 hours with went down to 2 L.  Review of Systems: As per HPI otherwise 14 point review of systems negative.    Past Medical History:  Diagnosis Date   Acquired absence of left great toe ([place])    Acquired absence of right leg below knee (HCC)    Acute osteomyelitis of calcaneum, right (HCC) 10/02/2016   Acute respiratory failure (HCC)     Amputation stump infection ([place]) 08/12/2018   Anemia    Basal cell carcinoma, eyelid    Cancer (HCC)    Candidiasis 08/12/2018   CHF (congestive heart failure) (HCC)    chronic diastolic    Chronic back pain    Chronic kidney disease    stage 3    Chronic multifocal osteomyelitis (HCC)    of ankle and foot    Chronic pain syndrome    COPD (chronic obstructive pulmonary disease) (HCC)    DDD (degenerative disc disease), lumbar    Depression    major depressive disorder    Diabetes mellitus without complication ([place])    type 2    Diabetic ulcer of toe of left foot ([place]) 10/02/2016   Difficult intubation    Difficulty in walking, not elsewhere classified    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   Foot drop, bilateral    Foot drop, right 12/27/2015   GERD (gastroesophageal reflux disease)    Herpesviral vesicular dermatitis    History of COVID-19 03/15/2022   Hyperlipidemia    Hyperlipidemia    Hypertension    Insomnia    Malignant melanoma of other parts of face ([place])    Melanoma ([place])    MRSA bacteremia    Muscle weakness (generalized)    Nasal congestion    Necrosis of toe ([place]) 07/08/2018   Neuromuscular dysfunction of bladder    Osteomyelitis ([place])  Osteomyelitis ([place])    Osteomyelitis ([place])    right BKA   Other idiopathic peripheral autonomic neuropathy    Retention of urine, unspecified    Squamous cell carcinoma of skin    Unsteadiness on feet    Urinary retention    Urinary retention    Urinary tract infection    Wears dentures    Wears glasses     Past Surgical History:  Procedure Laterality Date   AMPUTATION Left 03/29/2017   Procedure: LEFT GREAT TOE AMPUTATION AT METATARSOPHALANGEAL JOINT;  Surgeon: [name], MD;  Location: [place];  Service: Orthopedics;  Laterality: Left;   AMPUTATION Right 03/29/2017   Procedure: RIGHT BELOW KNEE AMPUTATION;  Surgeon: [name], MD;  Location: [place];  Service: Orthopedics;  Laterality: Right;   AMPUTATION Left  06/06/2018   Procedure: LEFT 2ND TOE AMPUTATION;  Surgeon: [name], MD;  Location: [place];  Service: Orthopedics;  Laterality: Left;   AMPUTATION Right 11/19/2018   Procedure: AMPUTATION ABOVE KNEE;  Surgeon: [name], MD;  Location: [place];  Service: Orthopedics;  Laterality: Right;   ANTERIOR CERVICAL CORPECTOMY N/A 11/25/2015   Procedure: ANTERIOR CERVICAL FIVE CORPECTOMY Cervical four - six fusion;  Surgeon: Consuella Lose, MD;  Location: [place] [facility];  Service: Neurosurgery;  Laterality: N/A;  ANTERIOR CERVICAL FIVE CORPECTOMY Cervical four - six fusion   APPLICATION OF WOUND VAC Right 10/22/2018   Procedure: Application Of Wound Vac;  Surgeon: [name], MD;  Location: [place];  Service: Orthopedics;  Laterality: Right;   CYSTOSCOPY WITH BIOPSY N/A 05/01/2022   Procedure: CYSTOSCOPY WITH URETHRAL BIOPSY;  Surgeon: Robley Fries, MD;  [facility];  Service: Urology;  Laterality: N/A;  1 HR   CYSTOSCOPY WITH RETROGRADE URETHROGRAM N/A 04/25/2018   Procedure: CYSTOSCOPY WITH RETROGRADE URETHROGRAM/ BALLOON DILATION;  Surgeon: Kathie Rhodes, MD;  Location: [facility];  Service: Urology;  Laterality: N/A;   CYSTOSCOPY WITH URETHRAL DILATATION N/A 05/01/2022   Procedure: BALLOON DILATION WITH  OPTILUME;  Surgeon: Robley Fries, MD;  [facility];  Service: Urology;  Laterality: N/A;   MULTIPLE TOOTH EXTRACTIONS     POSTERIOR CERVICAL FUSION/FORAMINOTOMY N/A 11/29/2015   Procedure: Cervical Three-Cervical Seven Posterior Cervical Laminectomy with Fusion;  Surgeon: Consuella Lose, MD;  Location: [place] [facility];  Service: Neurosurgery;  Laterality: N/A;  Cervical Three-Cervical Seven Posterior Cervical Laminectomy with Fusion   STUMP REVISION Right 10/22/2018   Procedure: REVISION RIGHT BELOW KNEE AMPUTATION;  Surgeon: [name], MD;  Location: [place];  Service: Orthopedics;  Laterality: Right;   STUMP REVISION Right 12/05/2018   Procedure: Revision Right Above Knee  Amputation;  Surgeon: [name], MD;  Location: [place];  Service: Orthopedics;  Laterality: Right;   STUMP REVISION Right 05/29/2019   Procedure: REVISION RIGHT ABOVE KNEE AMPUTATION;  Surgeon: [name], MD;  Location: [place];  Service: Orthopedics;  Laterality: Right;   STUMP REVISION Right 06/24/2019   Procedure: STUMP REVISION;  Surgeon: [name], MD;  Location: [place];  Service: Orthopedics;  Laterality: Right;     reports that he has been smoking cigarettes. He has been smoking an average of .5 packs per day. He has never used smokeless tobacco. He reports that he does not drink alcohol and does not use drugs.  No Known Allergies  Family History  Problem Relation Age of Onset   Diabetes Mother    Heart disease Mother  Cancer Mother    Cancer Father    Bone cancer Father    Prostate cancer Father    Cancer Paternal Grandfather    Cancer Other    Diabetes Other     Prior to Admission medications   Medication Sig Start Date End Date Taking? Authorizing Provider  cetirizine (ZYRTEC) 10 MG tablet Take 10 mg by mouth daily.    [provider]  cyanocobalamin 1000 MCG tablet Take 1,000 mcg by mouth daily.    [provider]  diclofenac Sodium (VOLTAREN) 1 % GEL Apply 4 g topically every 8 (eight) hours as needed (for pain). On right leg    [provider]  docusate sodium (COLACE) 100 MG capsule Take 1 capsule (100 mg total) by mouth 2 (two) times daily. 05/31/19   Amin, Jeanella Flattery, MD  eszopiclone (LUNESTA) 2 MG TABS tablet Take 2 mg by mouth at bedtime. Take immediately before bedtime    [provider]  ferrous sulfate 325 (65 FE) MG tablet Take 1 tablet (325 mg total) by mouth daily with breakfast. 11/23/18   Matilde Haymaker, MD  fluticasone [facility]) 50 MCG/ACT nasal spray Place 1 spray into both nostrils daily.    [provider]  furosemide (LASIX) 20 MG tablet Take 20-40 mg by mouth See admin instructions. Take '40mg'$  at 9am  and '20mg'$  at 4pm.    [provider]  gabapentin (NEURONTIN) 300 MG capsule Take 300 mg by mouth 3 (three) times daily.    [provider]  GLUCAGON HCL IJ Inject 1 mg into the muscle once as needed (hypoglycemia).    [provider]  guaiFENesin 200 MG tablet Take 200 mg by mouth every 6 (six) hours as needed (congestion).    [provider]  hydrOXYzine (ATARAX) 25 MG tablet Take 25 mg by mouth every 6 (six) hours as needed for itching or anxiety.    [provider]  Infant Care Products [facility]) OINT Apply 1 Application topically in the morning and at bedtime. To buttocks    [provider]  insulin glargine (LANTUS SOLOSTAR) 100 UNIT/ML Solostar Pen Inject 12 Units into the skin daily. Patient taking differently: Inject 12 Units into the skin 2 (two) times daily. 01/31/22   Dahal, Marlowe Aschoff, MD  insulin lispro (HUMALOG) 100 UNIT/ML KwikPen Inject 0-10 Units into the skin See admin instructions. Inject 0-10 units into the skin three times a day and at bedtime, PER SLIDING SCALE:  BGL 151-200 = 2 units 201-250 = 4 units 251-300 = 6 units 301-350 =  8 units 351-400 = 10 units 01/31/22   Dahal, Marlowe Aschoff, MD  ipratropium-albuterol (DUONEB) 0.5-2.5 (3) MG/3ML SOLN Take 3 mLs by nebulization every 6 (six) hours as needed (Shortness of breath).    [provider]  lactulose (CHRONULAC) 10 GM/15ML solution Take 15 mLs (10 g total) by mouth 3 (three) times daily as needed for moderate constipation. Patient taking differently: Take 10 g by mouth 3 (three) times daily. 01/31/22   Terrilee Croak, MD  levalbuterol (XOPENEX) 0.63 MG/3ML nebulizer solution Take 3 mLs (0.63 mg total) by nebulization every 6 (six) hours as needed for wheezing or shortness of breath. 01/31/22   Terrilee Croak, MD  Lidocaine 4 % PTCH Apply 2 patches topically daily. Apply one patch to right shoulder at 9am and remove at 9pm. Apply a second patch to Right stump at 9am and  remove at 9pm.    [provider]  magnesium oxide (MAG-OX) 400  MG tablet Take 400 mg by mouth 2 (two) times daily.    [provider]  melatonin 3 MG TABS tablet Take 3 mg by mouth at bedtime.    [provider]  Menthol, Topical Analgesic, (BIOFREEZE) 4 % GEL Apply 1 Application topically every 8 (eight) hours as needed (for pain). Apply to right leg/stump    [provider]  Meth-Hyo-M Bl-Na Phos-Ph Sal (URIBEL) 118 MG CAPS Take 1 capsule by mouth every 8 (eight) hours as needed (bladder pain).    [provider]  methenamine (MANDELAMINE) 1 g tablet Take 1 g by mouth daily.    [provider]  metoprolol tartrate (LOPRESSOR) 25 MG tablet Take 0.5 tablets (12.5 mg total) by mouth 2 (two) times daily. 07/18/19 09/06/22  Elodia Florence., MD  mirabegron ER (MYRBETRIQ) 50 MG TB24 tablet Take 50 mg by mouth daily.    [provider]  Multiple Vitamins-Minerals (MULTIVITAMIN WITH MINERALS) tablet Take 1 tablet by mouth daily.    [provider]  naloxone [facility]) 4 MG/0.1ML LIQD nasal spray kit Place 1 spray into the nose as needed (poorly responsive or turning blue).    [provider]  nystatin cream (MYCOSTATIN) Apply 1 Application topically See admin instructions. Apply to gluteal fold every day shift for wound care. May apply as needed as well.    [provider]  omeprazole (PRILOSEC) 20 MG capsule Take 20 mg by mouth at bedtime.    [provider]  ondansetron (ZOFRAN) 4 MG tablet Take 4 mg by mouth every 8 (eight) hours as needed for nausea.    [provider]  oxybutynin (DITROPAN-XL) 10 MG 24 hr tablet Take 10 mg by mouth daily.    [provider]  oxyCODONE (OXY IR/ROXICODONE) 5 MG immediate release tablet Take 1 tablet (5 mg total) by mouth every 8 (eight) hours as needed for severe pain (pain). Patient taking differently: Take 5 mg by mouth every 8 (eight) hours as needed  (pain). 09/08/21   Charlynne Cousins, MD  oxycodone (ROXICODONE) 30 MG immediate release tablet Take 1 tablet (30 mg total) by mouth every 8 (eight) hours. 09/08/21   Charlynne Cousins, MD  OXYGEN Inhale 2 L into the lungs as needed (for shortness of breath).    [provider]  oxymetazoline (AFRIN 12 HOUR) 0.05 % nasal spray Place 1 spray into both nostrils 2 (two) times daily as needed for congestion.    [provider]  phenazopyridine (PYRIDIUM) 100 MG tablet Take 100 mg by mouth every 8 (eight) hours as needed (dysuria).    [provider]  Polyethyl Glycol-Propyl Glycol (LUBRICANT EYE DROPS) 0.4-0.3 % SOLN Place 2 drops into both eyes every 4 (four) hours as needed (dryness/itching).    [provider]  polyethylene glycol (MIRALAX / GLYCOLAX) 17 g packet Take 17 g by mouth daily.    [provider]  pregabalin (LYRICA) 150 MG capsule Take 1 capsule (150 mg total) by mouth 2 (two) times daily. 07/07/21   Thurnell Lose, MD  promethazine (PHENERGAN) 12.5 MG tablet Take 12.5 mg by mouth every 6 (six) hours as needed for nausea or vomiting.    [provider]  rOPINIRole (REQUIP) 2 MG tablet Take 2 mg by mouth 2 (two) times daily.    [provider]  rosuvastatin (CRESTOR) 10 MG tablet Take 10 mg by mouth at bedtime.    [provider]  senna-docusate (SENOKOT-S) 8.6-50 MG  tablet Take 1 tablet by mouth at bedtime.    [provider]  SPIRIVA RESPIMAT 1.25 MCG/ACT AERS Inhale 2 each into the lungs in the morning.    [provider]  tamsulosin (FLOMAX) 0.4 MG CAPS capsule Take 0.8 mg by mouth daily. 11/26/13   [provider]  tiZANidine (ZANAFLEX) 4 MG tablet Take 4 mg by mouth every 6 (six) hours as needed for muscle spasms.    [provider]  triamcinolone cream (KENALOG) 0.1 % Apply 1 application  topically 2 (two) times daily. Apply to left lower leg    [provider]   TRULICITY [id number] SOPN Inject 0.75 mg into the skin every Wednesday.    [provider]    Physical Exam: Vitals:   08/22/22 1345 08/22/22 1400 08/22/22 1415 08/22/22 1430  BP: (!) 124/100 (!) 114/50 (!) 112/49 128/65  Pulse: (!) 129 (!) 126 (!) 127 99  Resp: (!) 23 (!) 26 (!) 24 18  Temp: (!) 101.9 F (38.8 C) (!) 102.2 F (39 C) (!) 102.4 F (39.1 C) (!) 102.6 F (39.2 C)  TempSrc:      SpO2: 93% (!) 88% 92% 93%  Weight:      Height:        Constitutional: NAD, calm, comfortable Vitals:   08/22/22 1345 08/22/22 1400 08/22/22 1415 08/22/22 1430  BP: (!) 124/100 (!) 114/50 (!) 112/49 128/65  Pulse: (!) 129 (!) 126 (!) 127 99  Resp: (!) 23 (!) 26 (!) 24 18  Temp: (!) 101.9 F (38.8 C) (!) 102.2 F (39 C) (!) 102.4 F (39.1 C) (!) 102.6 F (39.2 C)  TempSrc:      SpO2: 93% (!) 88% 92% 93%  Weight:      Height:       Eyes: PERRL, lids and conjunctivae normal ENMT: Mucous membranes are dry. Posterior pharynx clear of any exudate or lesions.Normal dentition.  Neck: normal, supple, no masses, no thyromegaly Respiratory: clear to auscultation bilaterally, diffused wheezing, no crackles, increasing breathing effort. No accessory muscle use.  Cardiovascular: Regular rate and rhythm, no murmurs / rubs / gallops. No extremity edema. 2+ pedal pulses. No carotid bruits.  Abdomen: no tenderness, no masses palpated. No hepatosplenomegaly. Bowel sounds positive.  Musculoskeletal: no clubbing / cyanosis. No joint deformity upper and lower extremities. Good ROM, no contractures. Normal muscle tone.  Skin: Left leg warm and tender to touch compared to right leg stump site, with multiple shallow ulcers as shown in the picture with some warmth border and bottom, significant swelling and discoloration of left foot with notable ulcers and discoloration of left second and third toes Neurologic: CN 2-12 grossly intact. Sensation intact, DTR normal. Strength 5/5 in all 4.   Psychiatric: Normal judgment and insight. Alert and oriented x 3. Normal mood.     Labs on Admission: I have personally reviewed following labs and imaging studies  CBC: Recent Labs  Lab 08/22/22 0907 08/22/22 1314 08/22/22 1438  WBC 15.5*  --  13.1*  NEUTROABS 13.9*  --   --   HGB 10.0* 8.8* 9.5*  HCT 32.9* 26.0* 30.0*  MCV 85.9  --  84.7  PLT 219  --  123456   Basic Metabolic Panel: Recent Labs  Lab 08/22/22 0907 08/22/22 1314  NA 137 137  K 4.7 4.2  CL 102  --   CO2 18*  --   GLUCOSE 187*  --   BUN 57*  --   CREATININE 3.87*  --  CALCIUM 8.5*  --    GFR: Estimated Creatinine Clearance: 20.5 mL/min (A) (by C-G formula based on SCr of 3.87 mg/dL (H)). Liver Function Tests: Recent Labs  Lab 08/22/22 0907  AST 48*  ALT 24  ALKPHOS 48  BILITOT 0.7  PROT 6.8  ALBUMIN 2.8*   No results for input(s): "LIPASE", "AMYLASE" in the last 168 hours. No results for input(s): "AMMONIA" in the last 168 hours. Coagulation Profile: Recent Labs  Lab 08/22/22 0907  INR 1.1   Cardiac Enzymes: No results for input(s): "CKTOTAL", "CKMB", "CKMBINDEX", "TROPONINI" in the last 168 hours. BNP (last 3 results) No results for input(s): "PROBNP" in the last 8760 hours. HbA1C: No results for input(s): "HGBA1C" in the last 72 hours. CBG: Recent Labs  Lab 08/22/22 1021  GLUCAP 189*   Lipid Profile: No results for input(s): "CHOL", "HDL", "LDLCALC", "TRIG", "CHOLHDL", "LDLDIRECT" in the last 72 hours. Thyroid Function Tests: No results for input(s): "TSH", "T4TOTAL", "FREET4", "T3FREE", "THYROIDAB" in the last 72 hours. Anemia Panel: No results for input(s): "VITAMINB12", "FOLATE", "FERRITIN", "TIBC", "IRON", "RETICCTPCT" in the last 72 hours. Urine analysis:    Component Value Date/Time   COLORURINE AMBER (A) 08/22/2022 0856   APPEARANCEUR CLEAR 08/22/2022 0856   LABSPEC 1.016 08/22/2022 0856   PHURINE 5.0 08/22/2022 0856   GLUCOSEU NEGATIVE 08/22/2022 0856   HGBUR  NEGATIVE 08/22/2022 0856   BILIRUBINUR NEGATIVE 08/22/2022 0856   [place] 08/22/2022 0856   PROTEINUR 100 (A) 08/22/2022 0856   UROBILINOGEN 1.0 02/03/2014 0052   NITRITE NEGATIVE 08/22/2022 0856   LEUKOCYTESUR NEGATIVE 08/22/2022 0856    Radiological Exams on Admission: DG Chest Port 1 View  Result Date: 08/22/2022 CLINICAL DATA:  Questionable sepsis EXAM: PORTABLE CHEST 1 VIEW COMPARISON:  Radiograph 05/01/2022 FINDINGS: Stable cardiac silhouette. There is patchy bilateral airspace disease more dense on the LEFT. No pneumothorax. Anterior posterior cervical fusion IMPRESSION: Bilateral airspace disease representing multifocal pneumonia versus pulmonary edema. Electronically Signed   By: Suzy Bouchard M.D.   On: 08/22/2022 10:00    EKG: Independently reviewed.  Sinus tachycardia, no acute ST changes.  Assessment/Plan Principal Problem:   Respiratory failure ([place]) Active Problems:   Sepsis due to cellulitis ([place])   Pressure injury of skin   Influenza A   Sepsis ([place])  (please populate well all problems here in Problem List. (For example, if patient is on BP meds at home and you resume or decide to hold them, it is a problem that needs to be her. Same for CAD, COPD, HLD and so on)  Severe sepsis -Evidenced by tachycardia, elevated lactate, fever, likely infectious source is left leg cellulitis plus minus multifocal pneumonia. -Continue fluid resuscitation, plan for continuing broad-spectrum antibiotics vancomycin and cefepime plus azithromycin to cover left leg cellulitis and multifocal pneumonia. -Hold off Lasix -Blood pressure significant improvement after initial IV boluses.  Plan to continue IV fluid.  Acute on chronic hypoxic respiratory failure -Probably combined effect of multifocal pneumonia and influenza A infection -Flu symptoms started 4 days ago, out of window for Tamiflu. -Antibiotics coverage will be cefepime vancomycin and azithromycin.  Consulted  pharmacy -ICU consultation appreciated, given that the patient's breathing symptoms significant improvement after initial management, agreed that patient can be managed at PCU for now. -Send sputum culture, atypical study including Legionella and mycoplasma -MRSA screening and strep a PCR  Left leg cellulitis -he has a new onset of left leg pain and rash, more than one third of surface area of the left  leg involved with cellulitis, plan to treat with IV antibiotics as above -Check DVT study -Most recent PVD screening was in 2022, as patient has significant right-sided limb loss status raising concern about new PVD, ordered arterial Doppler -Wound care consulted -Xray of left foot to rule out foreign bodies in the ulcers.  Acute COPD exacerbation -Likely triggered by influenza A infection as well as ongoing smoking -As patient has all of BiPAP, plan for p.o. steroid -Antibiotics as above -Continue home medication of ICS, LABA, Spiriva DuoNebs  AKI on CKD stage IIIb -Likely prerenal secondary to sepsis, IV resuscitation as above -Cut down Lantus, oxycodone and gabapentin dosage due to AKI  IDDM with hyperglycemia -Continue Lantus and sliding scale -Cut down Lantus dosage due to AKI  Chronic HFpEF -Hold off Lasix as patient is in sepsis and AKI  Cigarette smoke -Cessation education performed at bedside, -Nicotine patch for  GERD, chronic pain syndrome -Stable  DVT prophylaxis: Heparin subcu Code Status: Full code Family Communication: None at bedside Disposition Plan: Patient is sick with sepsis from pneumonia and cellulitis requiring IV antibiotics, expect more than 2 midnight hospital stay Consults called: PCCM, wound care Admission status: PCU   Lequita Halt MD [facility] Pager [phone]  08/22/2022, 3:19 PM

## 2022-08-23 ENCOUNTER — Inpatient Hospital Stay (HOSPITAL_COMMUNITY): Payer: Medicaid Other

## 2022-08-23 ENCOUNTER — Encounter (HOSPITAL_COMMUNITY): Payer: Medicaid Other

## 2022-08-23 DIAGNOSIS — R52 Pain, unspecified: Secondary | ICD-10-CM

## 2022-08-23 DIAGNOSIS — M7989 Other specified soft tissue disorders: Secondary | ICD-10-CM

## 2022-08-23 DIAGNOSIS — A419 Sepsis, unspecified organism: Secondary | ICD-10-CM | POA: Diagnosis not present

## 2022-08-23 DIAGNOSIS — I509 Heart failure, unspecified: Secondary | ICD-10-CM

## 2022-08-23 DIAGNOSIS — L0390 Cellulitis, unspecified: Secondary | ICD-10-CM

## 2022-08-23 DIAGNOSIS — J111 Influenza due to unidentified influenza virus with other respiratory manifestations: Secondary | ICD-10-CM | POA: Diagnosis not present

## 2022-08-23 DIAGNOSIS — J9601 Acute respiratory failure with hypoxia: Secondary | ICD-10-CM | POA: Diagnosis not present

## 2022-08-23 LAB — BLOOD CULTURE ID PANEL (REFLEXED) - BCID2

## 2022-08-23 LAB — URINE CULTURE: Culture: NO GROWTH

## 2022-08-23 LAB — BASIC METABOLIC PANEL
Anion gap: 11 (ref 5–15)
BUN: 60 mg/dL — ABNORMAL HIGH (ref 8–23)
CO2: 20 mmol/L — ABNORMAL LOW (ref 22–32)
Calcium: 8.1 mg/dL — ABNORMAL LOW (ref 8.9–10.3)
Chloride: 103 mmol/L (ref 98–111)
Creatinine, Ser: 3.5 mg/dL — ABNORMAL HIGH (ref 0.61–1.24)
GFR, Estimated: 19 mL/min — ABNORMAL LOW (ref >60)
Glucose, Bld: 168 mg/dL — ABNORMAL HIGH (ref 70–99)
Potassium: 4.4 mmol/L (ref 3.5–5.1)
Sodium: 134 mmol/L — ABNORMAL LOW (ref 135–145)

## 2022-08-23 LAB — CBC
HCT: 29 % — ABNORMAL LOW (ref 39.0–52.0)
Hemoglobin: 9.1 g/dL — ABNORMAL LOW (ref 13.0–17.0)
MCH: 26 pg (ref 26.0–34.0)
MCHC: 31.4 g/dL (ref 30.0–36.0)
MCV: 82.9 fL (ref 80.0–100.0)
Platelets: 176 10*3/uL (ref 150–400)
RBC: 3.5 MIL/uL — ABNORMAL LOW (ref 4.22–5.81)
RDW: 15.6 % — ABNORMAL HIGH (ref 11.5–15.5)
WBC: 11.9 10*3/uL — ABNORMAL HIGH (ref 4.0–10.5)
nRBC: 0 % (ref 0.0–0.2)

## 2022-08-23 LAB — STREP PNEUMONIAE URINARY ANTIGEN: Strep Pneumo Urinary Antigen: NEGATIVE

## 2022-08-23 LAB — GLUCOSE, CAPILLARY
Glucose-Capillary: 153 mg/dL — ABNORMAL HIGH (ref 70–99)
Glucose-Capillary: 144 mg/dL — ABNORMAL HIGH (ref 70–99)
Glucose-Capillary: 212 mg/dL — ABNORMAL HIGH (ref 70–99)

## 2022-08-23 LAB — MAGNESIUM: Magnesium: 1.5 mg/dL — ABNORMAL LOW (ref 1.7–2.4)

## 2022-08-23 MED ORDER — VANCOMYCIN HCL 1250 MG/250ML IV SOLN
1250.0000 mg | INTRAVENOUS | Status: DC
  Administered 2022-08-24: 1250 mg via INTRAVENOUS
  Filled 2022-08-23: qty 250

## 2022-08-23 MED ORDER — CHLORHEXIDINE GLUCONATE CLOTH 2 % EX PADS
6.0000 | MEDICATED_PAD | Freq: Every day | CUTANEOUS | Status: DC
  Administered 2022-08-23 – 2022-08-25 (×3): 6 via TOPICAL

## 2022-08-23 MED ORDER — ORAL CARE MOUTH RINSE: 15.0000 mL | OROMUCOSAL | Status: DC | PRN

## 2022-08-23 MED ORDER — LORAZEPAM 2 MG/ML IJ SOLN
1.0000 mg | Freq: Once | INTRAMUSCULAR | Status: AC
  Administered 2022-08-23: 1 mg via INTRAVENOUS
  Filled 2022-08-23: qty 1

## 2022-08-23 NOTE — Plan of Care (Signed)

## 2022-08-23 NOTE — Progress Notes (Signed)
VASCULAR LAB    Left lower extremity venous duplex has been performed.  ABI attempted, however, unable to perform test secondary to skin and tissue properties.   See CV proc for preliminary results.   Adrinne Sze, RVT 08/23/2022, 11:59 AM

## 2022-08-23 NOTE — Progress Notes (Signed)
PHARMACY - PHYSICIAN COMMUNICATION CRITICAL VALUE ALERT - BLOOD CULTURE IDENTIFICATION (BCID)  Gregory [name] is an 62 y.o. male who presented to [facility] on 08/22/2022 with a chief complaint of cellulitis   Name of physician (or Provider) Contacted: Dr. Myna Hidalgo  Current antibiotics: Vancomycin, Zosyn  Changes to prescribed antibiotics recommended:  No changes   Results for orders placed or performed during the hospital encounter of 08/22/22  Blood Culture ID Panel (Reflexed) (Collected: 08/22/2022  8:56 AM)  Result Value Ref Range   Enterococcus faecalis NOT DETECTED NOT DETECTED   Enterococcus Faecium NOT DETECTED NOT DETECTED   Listeria monocytogenes NOT DETECTED NOT DETECTED   Staphylococcus species NOT DETECTED NOT DETECTED   Staphylococcus aureus (BCID) NOT DETECTED NOT DETECTED   Staphylococcus epidermidis NOT DETECTED NOT DETECTED   Staphylococcus lugdunensis NOT DETECTED NOT DETECTED   Streptococcus species DETECTED (A) NOT DETECTED   Streptococcus agalactiae NOT DETECTED NOT DETECTED   Streptococcus pneumoniae NOT DETECTED NOT DETECTED   Streptococcus pyogenes NOT DETECTED NOT DETECTED   A.calcoaceticus-baumannii NOT DETECTED NOT DETECTED   Bacteroides fragilis NOT DETECTED NOT DETECTED   Enterobacterales NOT DETECTED NOT DETECTED   Enterobacter cloacae complex NOT DETECTED NOT DETECTED   Escherichia coli NOT DETECTED NOT DETECTED   Klebsiella aerogenes NOT DETECTED NOT DETECTED   Klebsiella oxytoca NOT DETECTED NOT DETECTED   Klebsiella pneumoniae NOT DETECTED NOT DETECTED   Proteus species NOT DETECTED NOT DETECTED   Salmonella species NOT DETECTED NOT DETECTED   Serratia marcescens NOT DETECTED NOT DETECTED   Haemophilus influenzae NOT DETECTED NOT DETECTED   Neisseria meningitidis NOT DETECTED NOT DETECTED   Pseudomonas aeruginosa NOT DETECTED NOT DETECTED   Stenotrophomonas maltophilia NOT DETECTED NOT DETECTED   Candida albicans NOT DETECTED NOT DETECTED   Candida  auris NOT DETECTED NOT DETECTED   Candida glabrata NOT DETECTED NOT DETECTED   Candida krusei NOT DETECTED NOT DETECTED   Candida parapsilosis NOT DETECTED NOT DETECTED   Candida tropicalis NOT DETECTED NOT DETECTED   Cryptococcus neoformans/gattii NOT DETECTED NOT DETECTED    Narda Bonds 08/23/2022  3:25 AM

## 2022-08-23 NOTE — Progress Notes (Signed)
PROGRESS NOTE    Gregory [name]  [name] DOB: Jan 26, 1961 DOA: 08/22/2022 PCP: [name], MD    Chief Complaint  Patient presents with   Code Sepsis    Brief Narrative:   Gregory [name] is a 62 y.o. 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  Chest x-ray showed multifocal pneumonia.  Blood work showed WBC 15, hemoglobin 10.0, 18, creatinine is 7 compared to baseline 1.8-2.0.  UA showed no signs of UTI   Started on cefepime and vancomycin.  Was on BiPAP for about 2 hours with went down to 2 L.  Assessment & Plan:   Principal Problem:   Respiratory failure ([place]) Active Problems:   Sepsis due to cellulitis ([place])   Pressure injury of skin   Influenza A   Sepsis ([place])   Severe sepsis -Evidenced by tachycardia, elevated lactate, fever, likely infectious source is left leg cellulitis plus minus multifocal pneumonia. -Continue fluid resuscitation, plan for continuing broad-spectrum antibiotics vancomycin and cefepime plus azithromycin to cover left leg cellulitis and multifocal pneumonia. -Evidence of acute osteomyelitis on imaging -1/4 blood culture growing gram-positive cocci, likely contaminant   Acute on chronic hypoxic respiratory failure Acute COPD exacerbation -Multifocal pneumonia  -influenza A pneumonia -Continue with Tamiflu -Continue with antibiotic coverage for pneumonia -He was encouraged to use incentive spirometry and flutter valve -Continue home medication of ICS, LABA, Spiriva DuoNebs    Left leg cellulitis -he has a new onset of left leg pain and rash, more than one third of surface area of the left leg involved with cellulitis, plan to treat with IV antibiotics as  above -No evidence of DVT -Most recent PVD screening was in 2022, as patient has significant right-sided limb loss status raising concern about new PVD, ordered arterial Doppler -Wound care consulted -Continue with IV antibiotics   AKI on CKD stage IIIb -Likely prerenal secondary to sepsis, IV resuscitation as above -Cut down Lantus, oxycodone and gabapentin dosage due to AKI   IDDM with hyperglycemia -Continue Lantus and sliding scale -Cut down Lantus dosage due to AKI   Chronic HFpEF -Hold off Lasix as patient is in sepsis and AKI   Cigarette smoke -Cessation education performed at bedside, -Nicotine patch for   GERD, chronic pain syndrome -Stable   DVT prophylaxis: Heparin Code Status: Full Family Communication: None at bedside Disposition:   Status is: Inpatient    Consultants:  None   Subjective:  And complaining of pain, febrile 101.3 this morning.  Objective: Vitals:   08/23/22 0959 08/23/22 1000 08/23/22 1032 08/23/22 1221  BP: (!) 148/66   98/63  Pulse: 98 (!) 106 (!) 106 91  Resp: 18 (!) 24 (!) 24 19  Temp: (!) 101.3 F (38.5 C)   (!) 101 F (38.3 C)  TempSrc:    Axillary  SpO2:  93% 91% 95%  Weight:      Height:        Intake/Output Summary (Last 24 hours) at 08/23/2022 1300 Last data filed at 08/23/2022 0641 Gross per 24 hour  Intake --  Output 1600 ml  Net -1600 ml   Filed Weights   08/22/22 0853 08/23/22 0500  Weight: 86.2 kg 86.1 kg    Examination:  Awake  Alert, Oriented X 3, No new F.N deficits, Normal affect Symmetrical Chest wall movement, Good air movement bilaterally, CTAB RRR,No Gallops,Rubs or new Murmurs, No Parasternal Heave +ve B.Sounds, Abd Soft, No tenderness, No rebound - guarding or rigidity. Right AKA, left lower extremity CHRONIC skin changes, edema and digit amputations    Data Reviewed: I have personally reviewed following labs and imaging studies  CBC: Recent Labs  Lab 08/22/22 0907 08/22/22 1314  08/22/22 1438 08/23/22 0724  WBC 15.5*  --  13.1* 11.9*  NEUTROABS 13.9*  --   --   --   HGB 10.0* 8.8* 9.5* 9.1*  HCT 32.9* 26.0* 30.0* 29.0*  MCV 85.9  --  84.7 82.9  PLT 219  --  201 0000000    Basic Metabolic Panel: Recent Labs  Lab 08/22/22 0907 08/22/22 1314 08/22/22 1438 08/23/22 0724  NA 137 137  --  134*  K 4.7 4.2  --  4.4  CL 102  --   --  103  CO2 18*  --   --  20*  GLUCOSE 187*  --   --  168*  BUN 57*  --   --  60*  CREATININE 3.87*  --  3.66* 3.50*  CALCIUM 8.5*  --   --  8.1*  MG  --   --   --  1.5*    GFR: Estimated Creatinine Clearance: 22.6 mL/min (A) (by C-G formula based on SCr of 3.5 mg/dL (H)).  Liver Function Tests: Recent Labs  Lab 08/22/22 0907  AST 48*  ALT 24  ALKPHOS 48  BILITOT 0.7  PROT 6.8  ALBUMIN 2.8*    CBG: Recent Labs  Lab 08/22/22 1021 08/22/22 1706 08/22/22 2056 08/23/22 0840 08/23/22 1216  GLUCAP 189* 145* 207* 153* 144*     Recent Results (from the past 240 hour(s))  Resp panel by RT-PCR (RSV, Flu A&B, Covid) Anterior Nasal Swab     Status: Abnormal   Collection Time: 08/22/22  8:56 AM   Specimen: Anterior Nasal Swab  Result Value Ref Range Status   SARS Coronavirus 2 by RT PCR NEGATIVE NEGATIVE Final   Influenza A by PCR POSITIVE (A) NEGATIVE Final   Influenza B by PCR NEGATIVE NEGATIVE Final    Comment: (NOTE) The Xpert Xpress SARS-CoV-2/FLU/RSV plus assay is intended as an aid in the diagnosis of influenza from Nasopharyngeal swab specimens and should not be used as a sole basis for treatment. Nasal washings and aspirates are unacceptable for Xpert Xpress SARS-CoV-2/FLU/RSV testing.  Fact Sheet for Patients: EntrepreneurPulse.com.au  Fact Sheet for Healthcare Providers: IncredibleEmployment.be  This test is not yet approved or cleared by the [place] FDA and has been authorized for detection and/or diagnosis of SARS-CoV-2 by FDA under an Emergency Use  Authorization (EUA). This EUA will remain in effect (meaning this test can be used) for the duration of the COVID-19 declaration under Section 564(b)(1) of the Act, 21 U.S.C. section 360bbb-3(b)(1), unless the authorization is terminated or revoked.     Resp Syncytial Virus by PCR NEGATIVE NEGATIVE Final    Comment: (NOTE) Fact Sheet for Patients: EntrepreneurPulse.com.au  Fact Sheet for Healthcare Providers: IncredibleEmployment.be  This test is not yet approved or cleared by the [place] FDA and has been authorized for detection and/or diagnosis of SARS-CoV-2 by FDA under an Emergency Use Authorization (EUA). This EUA will remain in effect (meaning this test can be used) for the duration of the COVID-19 declaration under Section 564(b)(1) of the Act, 21  U.S.C. section 360bbb-3(b)(1), unless the authorization is terminated or revoked.  Performed at [facility], [place] [address], [place], [place] [address]   Blood Culture (routine x 2)     Status: None (Preliminary result)   Collection Time: 08/22/22  8:56 AM   Specimen: BLOOD  Result Value Ref Range Status   Specimen Description BLOOD SITE NOT SPECIFIED  Final   Special Requests   Final    BOTTLES DRAWN AEROBIC AND ANAEROBIC Blood Culture results may not be optimal due to an inadequate volume of blood received in culture bottles   Culture  Setup Time   Final    GRAM POSITIVE COCCI IN CHAINS IN PAIRS AEROBIC BOTTLE ONLY CRITICAL RESULT CALLED TO, READ BACK BY AND VERIFIED WITH: PHARMD J. LEDFORD 08/23/22 @ 0159 BY AB Performed at [facility], [place] [address] [place], [place] [address]    Culture GRAM POSITIVE COCCI  Final   Report Status PENDING  Incomplete  Blood Culture ID Panel (Reflexed)     Status: Abnormal   Collection Time: 08/22/22  8:56 AM  Result Value Ref Range Status   Enterococcus faecalis NOT DETECTED NOT DETECTED Final   Enterococcus Faecium NOT DETECTED  NOT DETECTED Final   Listeria monocytogenes NOT DETECTED NOT DETECTED Final   Staphylococcus species NOT DETECTED NOT DETECTED Final   Staphylococcus aureus (BCID) NOT DETECTED NOT DETECTED Final   Staphylococcus epidermidis NOT DETECTED NOT DETECTED Final   Staphylococcus lugdunensis NOT DETECTED NOT DETECTED Final   Streptococcus species DETECTED (A) NOT DETECTED Final    Comment: Not Enterococcus species, Streptococcus agalactiae, Streptococcus pyogenes, or Streptococcus pneumoniae. CRITICAL RESULT CALLED TO, READ BACK BY AND VERIFIED WITH: PHARMD J. LEDFORD 08/23/22 @ 0159 BY AB    Streptococcus agalactiae NOT DETECTED NOT DETECTED Final   Streptococcus pneumoniae NOT DETECTED NOT DETECTED Final   Streptococcus pyogenes NOT DETECTED NOT DETECTED Final   A.calcoaceticus-baumannii NOT DETECTED NOT DETECTED Final   Bacteroides fragilis NOT DETECTED NOT DETECTED Final   Enterobacterales NOT DETECTED NOT DETECTED Final   Enterobacter cloacae complex NOT DETECTED NOT DETECTED Final   Escherichia coli NOT DETECTED NOT DETECTED Final   Klebsiella aerogenes NOT DETECTED NOT DETECTED Final   Klebsiella oxytoca NOT DETECTED NOT DETECTED Final   Klebsiella pneumoniae NOT DETECTED NOT DETECTED Final   Proteus species NOT DETECTED NOT DETECTED Final   Salmonella species NOT DETECTED NOT DETECTED Final   Serratia marcescens NOT DETECTED NOT DETECTED Final   Haemophilus influenzae NOT DETECTED NOT DETECTED Final   Neisseria meningitidis NOT DETECTED NOT DETECTED Final   Pseudomonas aeruginosa NOT DETECTED NOT DETECTED Final   Stenotrophomonas maltophilia NOT DETECTED NOT DETECTED Final   Candida albicans NOT DETECTED NOT DETECTED Final   Candida auris NOT DETECTED NOT DETECTED Final   Candida glabrata NOT DETECTED NOT DETECTED Final   Candida krusei NOT DETECTED NOT DETECTED Final   Candida parapsilosis NOT DETECTED NOT DETECTED Final   Candida tropicalis NOT DETECTED NOT DETECTED Final    Cryptococcus neoformans/gattii NOT DETECTED NOT DETECTED Final    Comment: Performed at [facility], [address] [place], [place] [address]  Remove and replace urinary cath (placed > 5 days) then obtain urine culture from new indwelling urinary catheter.     Status: None   Collection Time: 08/22/22  8:57 AM   Specimen: Urine, Catheterized  Result Value Ref Range Status   Specimen Description URINE, CATHETERIZED  Final   Special Requests  NONE  Final   Culture   Final    NO GROWTH Performed at [facility], [place] [address] [place], [place] [address]    Report Status 08/23/2022 FINAL  Final  Blood Culture (routine x 2)     Status: None (Preliminary result)   Collection Time: 08/22/22 11:35 AM   Specimen: BLOOD  Result Value Ref Range Status   Specimen Description BLOOD SITE NOT SPECIFIED  Final   Special Requests   Final    BOTTLES DRAWN AEROBIC AND ANAEROBIC Blood Culture adequate volume   Culture   Final    NO GROWTH < 24 HOURS Performed at [facility], [place] [address], [place], [place] [address]    Report Status PENDING  Incomplete         Radiology Studies: VAS [place] LOWER EXTREMITY VENOUS (DVT)  Result Date: 08/23/2022  Lower Venous DVT Study Patient Name:  KENNET ALWORTH  Date of Exam:   08/23/2022 Medical Rec #: [id number]     Accession #:    [id number] Date of Birth: 09-24-1960    Patient Gender: M Patient Age:   65 years Exam Location:  [facility] Procedure:      VAS [place] LOWER EXTREMITY VENOUS (DVT) Referring Phys: Wynetta Fines --------------------------------------------------------------------------------  Indications: Pain, Swelling, and ulceration.  Risk Factors: Right Above knee amputation 11/19/18. Multiple left toe amputations Limitations: Poor ultrasound/tissue interface and Woody edema. Comparison Study: Prior negative left LEV done 07/05/21 Performing Technologist: Sharion Dove RVS  Examination Guidelines: A complete evaluation includes B-mode  imaging, spectral Doppler, color Doppler, and power Doppler as needed of all accessible portions of each vessel. Bilateral testing is considered an integral part of a complete examination. Limited examinations for reoccurring indications may be performed as noted. The reflux portion of the exam is performed with the patient in reverse Trendelenburg.  +-------+---------------+---------+-----------+----------+--------------+ RIGHT  CompressibilityPhasicitySpontaneityPropertiesThrombus Aging +-------+---------------+---------+-----------+----------+--------------+ CFV    Full           Yes      Yes                                 +-------+---------------+---------+-----------+----------+--------------+ FV ProxFull                                                        +-------+---------------+---------+-----------+----------+--------------+ PFV    Full                                                        +-------+---------------+---------+-----------+----------+--------------+   +---------+---------------+---------+-----------+----------+-------------------+ LEFT     CompressibilityPhasicitySpontaneityPropertiesThrombus Aging      +---------+---------------+---------+-----------+----------+-------------------+ CFV      Full           Yes      Yes                                      +---------+---------------+---------+-----------+----------+-------------------+ SFJ      Full                                                             +---------+---------------+---------+-----------+----------+-------------------+  FV Prox  Full                                                             +---------+---------------+---------+-----------+----------+-------------------+ FV Mid   Full                                                             +---------+---------------+---------+-----------+----------+-------------------+ FV DistalFull                                                              +---------+---------------+---------+-----------+----------+-------------------+ PFV      Full                                                             +---------+---------------+---------+-----------+----------+-------------------+ POP      Full           Yes      Yes                                      +---------+---------------+---------+-----------+----------+-------------------+ PTV                                                   Not well visualized +---------+---------------+---------+-----------+----------+-------------------+ PERO                                                  Not well visualized +---------+---------------+---------+-----------+----------+-------------------+    Summary: RIGHT: - No evidence of common femoral vein obstruction.  LEFT: - There is no evidence of deep vein thrombosis in the lower extremity. However, portions of this examination were limited- see technologist comments above.  - Attempted left ABI/duplex of left PTA, ATA. However, skin/tissue properties prohibit evaluation of the vessels of the calf/ankle - Ultrasound characteristics of enlarged, reactive lymph nodes noted in the groin.  *See table(s) above for measurements and observations.    Preliminary    MR FOOT LEFT WO CONTRAST  Result Date: 08/23/2022 CLINICAL DATA:  Osteomyelitis, foot EXAM: MRI OF THE LEFT FOOT WITHOUT CONTRAST TECHNIQUE: Multiplanar, multisequence MR imaging of the left forefoot was performed. No intravenous contrast was administered. COMPARISON:  Left radiograph 08/22/2022 FINDINGS: Bones/Joint/Cartilage Prior partial first and second ray amputations. Prior partial third toe amputation. There is focal blunting of the fourth toe distal phalangeal tuft without marrow edema, likely chronic. There is chronic erosive changes at the  third metatarsal head and arthritis of the third MTP joint. There is no significant associated  marrow edema and there is preserved T1 marrow signal. Ligaments Intact Lisfranc ligament. Muscles and Tendons Chronic denervation changes in the foot. Soft tissues Severe diffuse skin thickening and soft tissue swelling of the foot. There is no organized fluid collection on noncontrast MRI. IMPRESSION: Severe diffuse skin thickening and soft tissue swelling of the foot, as can be seen cellulitis. No evidence soft tissue abscess on noncontrast MRI. Chronic erosive change at the third metatarsal head and arthritis of the third MTP joint. Focal blunting of the fourth toe distal phalangeal tuft without marrow edema, also likely chronic. No evidence of acute osteomyelitis. Electronically Signed   By: Maurine Simmering M.D.   On: 08/23/2022 11:43   DG Foot 2 Views Left  Result Date: 08/22/2022 CLINICAL DATA:  Cellulitis EXAM: LEFT FOOT - 2 VIEW COMPARISON:  Radiograph 06/03/2018 FINDINGS: Severe soft tissue swelling of the left foot and ankle. Prior partial first and second ray amputations. Prior partial third toe amputation. Osseous lucency in the third metatarsal head and articular surface irregularity of the third MTP joint. New blunting and cortical irregularity of the fourth toe distal phalangeal tuft. Focal lucency in the posterior calcaneus. IMPRESSION: Severe soft tissue swelling of the left foot and ankle. Osseous lucency in the third metatarsal head and articular surface irregularity of the third MTP joint, and new blunting and cortical irregularity of the fourth toe distal phalangeal tuft, suspicious for osteomyelitis. Focal osseous lucency in the posterior calcaneus, correlate with presence of adjacent ulcer in the heel. Electronically Signed   By: Maurine Simmering M.D.   On: 08/22/2022 15:54   DG Tibia/Fibula Left  Result Date: 08/22/2022 CLINICAL DATA:  Cellulitis EXAM: LEFT TIBIA AND FIBULA - 2 VIEW COMPARISON:  None Available. FINDINGS: No acute osseous abnormality involving the left tibia or fibula. Alignment  is normal. There is no bone erosion or frank bony destruction. There is severe soft tissue swelling of the left lower leg. IMPRESSION: Severe soft tissue swelling of the left lower leg. No acute osseous abnormality involving the left tibia or fibula. Electronically Signed   By: Maurine Simmering M.D.   On: 08/22/2022 15:48   DG Chest Port 1 View  Result Date: 08/22/2022 CLINICAL DATA:  Questionable sepsis EXAM: PORTABLE CHEST 1 VIEW COMPARISON:  Radiograph 05/01/2022 FINDINGS: Stable cardiac silhouette. There is patchy bilateral airspace disease more dense on the LEFT. No pneumothorax. Anterior posterior cervical fusion IMPRESSION: Bilateral airspace disease representing multifocal pneumonia versus pulmonary edema. Electronically Signed   By: Suzy Bouchard M.D.   On: 08/22/2022 10:00        Scheduled Meds:  azithromycin  500 mg Oral Daily   budesonide (PULMICORT) nebulizer solution  0.25 mg Nebulization BID   Chlorhexidine Gluconate Cloth  6 each Topical Daily   fluticasone  1 spray Each Nare Daily   gabapentin  100 mg Oral TID   heparin  5,000 Units Subcutaneous Q8H   insulin aspart  0-5 Units Subcutaneous QHS   insulin aspart  0-9 Units Subcutaneous TID WC   insulin glargine-yfgn  15 Units Subcutaneous Daily   ipratropium-albuterol  3 mL Nebulization Q6H   nicotine  21 mg Transdermal Daily   oseltamivir  30 mg Oral Daily   pantoprazole  40 mg Oral Daily   polyethylene glycol  17 g Oral Daily   predniSONE  40 mg Oral Q breakfast   senna-docusate  1 tablet Oral  QHS   umeclidinium bromide  1 puff Inhalation Daily   vancomycin variable dose per unstable renal function (pharmacist dosing)   Does not apply See admin instructions   Continuous Infusions:  piperacillin-tazobactam (ZOSYN)  IV 3.375 g (08/23/22 0845)     LOS: 1 day        Phillips Climes, MD [facility]   To contact the attending provider between 7A-7P or the covering provider during after hours 7P-7A, please  log into the web site [email or web address] and access using universal [place] password for that web site. If you do not have the password, please call the hospital operator.  08/23/2022, 1:00 PM

## 2022-08-23 NOTE — Progress Notes (Signed)
Pharmacy Antibiotic Note  Gregory [name] is a 62 y.o. male admitted on 08/22/2022 with sepsis.  Pharmacy was consulted  on 08/22/22 for cefepime and vancomcyin dosing.Suspected source cellulitis, patient with hx of Strep bacteremia in 2023. Patient also had ESBL UTI approx. 3 years ago. With significant AKI, Scr: 3.87, baseline ~~ 1.8-2.0.  3/6/ PM,  MD changed Cfepime to Zosyn to cover for anaerobes.   Vancomycin '1750mg'$  x1 given 3/6 at 10:52 AM.  AKI on CKD stage 3b, SCr down to 3.5 today.  BCID 1 of 4 blood culture grew GPC likely contaminant   Plan: Continue Zosyn 3.375g IV x1 then q8 over 4 hrs Vancomycin 1250 mg IV q48h, next dose due 3/8 10AM.  Predicted AUC 478 (SCr  3.5)   Monitor clinical progress, renal function. Check steady state vancomycin levels per protocol if needed.  Follow culture data for de-escalation.    Height: '5\' 7"'$  (170.2 cm) Weight: 86.1 kg (189 lb 13.1 oz) IBW/kg (Calculated) : 66.1  Temp (24hrs), Avg:103 F (39.4 C), Min:98.2 F (36.8 C), Max:104.7 F (40.4 C)  Recent Labs  Lab 08/22/22 0907 08/22/22 1438 08/23/22 0724  WBC 15.5* 13.1* 11.9*  CREATININE 3.87* 3.66* 3.50*  LATICACIDVEN 2.6*  --   --      Estimated Creatinine Clearance: 22.6 mL/min (A) (by C-G formula based on SCr of 3.5 mg/dL (H)).    No Known Allergies  Antimicrobials this admission: Vanc 3/6>> Cefepime 3/6>3/6 x1 Zosyn 3/6>> Azith 3/6>> Flagyl 3/6 x1  Oseltamivir 3/6 >>    Microbiology: 3/6 BCx x2:  GPC pending    >> BCID  1/4 GPC likely contaminant  3/6 BCx x2:  ngtd <24 hr 3/6 Resp panel pcr: influ A  +,    flu B neg, Covid19 neg, RSV neg.  3/6 UCx cath: negF  Thank you for allowing pharmacy to be part of this patients care team. Nicole Cella, [place] Pharmacist [phone] 08/23/2022 2:30 PM Please check AMION for all [place] phone numbers After 10:00 PM, call [place]

## 2022-08-24 DIAGNOSIS — A419 Sepsis, unspecified organism: Secondary | ICD-10-CM | POA: Diagnosis not present

## 2022-08-24 DIAGNOSIS — L0390 Cellulitis, unspecified: Secondary | ICD-10-CM | POA: Diagnosis not present

## 2022-08-24 DIAGNOSIS — J111 Influenza due to unidentified influenza virus with other respiratory manifestations: Secondary | ICD-10-CM | POA: Diagnosis not present

## 2022-08-24 LAB — CBC
HCT: 26.9 % — ABNORMAL LOW (ref 39.0–52.0)
Hemoglobin: 8.4 g/dL — ABNORMAL LOW (ref 13.0–17.0)
MCH: 26.4 pg (ref 26.0–34.0)
MCHC: 31.2 g/dL (ref 30.0–36.0)
MCV: 84.6 fL (ref 80.0–100.0)
Platelets: 154 10*3/uL (ref 150–400)
RBC: 3.18 MIL/uL — ABNORMAL LOW (ref 4.22–5.81)
RDW: 15.6 % — ABNORMAL HIGH (ref 11.5–15.5)
WBC: 10.9 10*3/uL — ABNORMAL HIGH (ref 4.0–10.5)
nRBC: 0 % (ref 0.0–0.2)

## 2022-08-24 LAB — BASIC METABOLIC PANEL
Anion gap: 11 (ref 5–15)
BUN: 68 mg/dL — ABNORMAL HIGH (ref 8–23)
CO2: 21 mmol/L — ABNORMAL LOW (ref 22–32)
Calcium: 8 mg/dL — ABNORMAL LOW (ref 8.9–10.3)
Chloride: 103 mmol/L (ref 98–111)
Creatinine, Ser: 3.52 mg/dL — ABNORMAL HIGH (ref 0.61–1.24)
GFR, Estimated: 19 mL/min — ABNORMAL LOW (ref >60)
Glucose, Bld: 161 mg/dL — ABNORMAL HIGH (ref 70–99)
Potassium: 4.1 mmol/L (ref 3.5–5.1)
Sodium: 135 mmol/L (ref 135–145)

## 2022-08-24 LAB — MYCOPLASMA PNEUMONIAE ANTIBODY, IGM: Mycoplasma pneumo IgM: 801 U/mL — ABNORMAL HIGH (ref 0–769)

## 2022-08-24 LAB — GLUCOSE, CAPILLARY
Glucose-Capillary: 136 mg/dL — ABNORMAL HIGH (ref 70–99)
Glucose-Capillary: 144 mg/dL — ABNORMAL HIGH (ref 70–99)
Glucose-Capillary: 170 mg/dL — ABNORMAL HIGH (ref 70–99)

## 2022-08-24 MED ORDER — OXYCODONE HCL 5 MG PO TABS
20.0000 mg | ORAL_TABLET | Freq: Three times a day (TID) | ORAL | Status: DC | PRN
  Administered 2022-08-25 – 2022-08-27 (×8): 20 mg via ORAL
  Filled 2022-08-24 (×9): qty 4

## 2022-08-24 MED ORDER — CALCIUM CARBONATE ANTACID 500 MG PO CHEW
1.0000 | CHEWABLE_TABLET | Freq: Four times a day (QID) | ORAL | Status: DC | PRN
  Administered 2022-08-24: 200 mg via ORAL
  Filled 2022-08-24: qty 1

## 2022-08-24 MED ORDER — CALCIUM CARBONATE ANTACID 500 MG PO CHEW: 1.0000 | CHEWABLE_TABLET | Freq: Three times a day (TID) | ORAL | Status: DC

## 2022-08-24 MED ORDER — ONDANSETRON HCL 4 MG/2ML IJ SOLN
4.0000 mg | Freq: Four times a day (QID) | INTRAMUSCULAR | Status: DC | PRN
  Administered 2022-08-24 – 2022-08-26 (×3): 4 mg via INTRAVENOUS
  Filled 2022-08-24 (×3): qty 2

## 2022-08-24 MED ORDER — IPRATROPIUM-ALBUTEROL 0.5-2.5 (3) MG/3ML IN SOLN
3.0000 mL | Freq: Two times a day (BID) | RESPIRATORY_TRACT | Status: DC
  Filled 2022-08-24 (×3): qty 3

## 2022-08-24 NOTE — Progress Notes (Addendum)
PROGRESS NOTE    Gregory [name]  [id number] DOB: Jul 24, 1960 DOA: 08/22/2022 PCP: [name], MD    Chief Complaint  Patient presents with   Code Sepsis    Brief Narrative:   Gregory [name] is a 62 y.o. 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  Chest x-ray showed multifocal pneumonia.  Blood work showed WBC 15, hemoglobin 10.0, 18, creatinine is 7 compared to baseline 1.8-2.0.  UA showed no signs of UTI   Started on cefepime and vancomycin.  Was on BiPAP for about 2 hours with went down to 2 L.  Assessment & Plan:   Principal Problem:   Respiratory failure ([place]) Active Problems:   Sepsis due to cellulitis ([place])   Pressure injury of skin   Influenza A   Sepsis ([place])   Severe sepsis due to left lower extremity cellulitis and multifocal pneumonia -Evidenced by tachycardia, elevated lactate, fever, likely infectious source is left leg cellulitis plus minus multifocal pneumonia. -Continue fluid resuscitation, plan for continuing broad-spectrum antibiotics vancomycin and cefepime plus azithromycin to cover left leg cellulitis and multifocal pneumonia. -No evidence of acute osteomyelitis on MRI -1/4 blood culture growing gram-positive cocci, likely contaminant   Acute on chronic hypoxic respiratory failure Acute COPD exacerbation -Multifocal pneumonia  -influenza A pneumonia -Continue with Tamiflu -Continue with antibiotic coverage for pneumonia -He was encouraged to use incentive spirometry and flutter valve -Continue home medication of ICS, LABA, Spiriva DuoNebscare consulted -He is on 5 L nasal cannula this morning, weaned down to 3 L, he denies any home oxygen at baseline  Left leg  cellulitis -he has a new onset of left leg pain and rash, more than one third of surface area of the left leg involved with cellulitis, plan to treat with IV antibiotics as above -No evidence of DVT -Wound care consulted -Continue with IV antibiotics -Patient with left lower extremity wound in the ankle area, reports this is postop with recent benign tumor resection   AKI on CKD stage IIIb -Likely prerenal secondary to sepsis, IV resuscitation as above -Cut down Lantus, oxycodone and gabapentin dosage due to AKI -Foley catheter inserted in ED, will discontinue.   IDDM with hyperglycemia -Continue Lantus and sliding scale -Cut down Lantus dosage due to AKI   Chronic HFpEF -Hold off Lasix as patient is in sepsis and AKI   Cigarette smoke -Cessation education performed at bedside, -Nicotine patch for   GERD, chronic pain syndrome -Stable   DVT prophylaxis: Heparin Code Status: Full Family Communication: None at bedside Disposition:   Status is: Inpatient    Consultants:  None   Subjective:  He denies any complaints today, afebrile over last 24 hours  Objective: Vitals:   08/24/22 0358 08/24/22 0401 08/24/22 0800 08/24/22 1134  BP: (!) 101/54  121/61 (!) 139/54  Pulse: 83  78 95  Resp: '20  17 18  '$ Temp: 98.2 F (36.8 C)  98.1 F (36.7 C) 98.2 F (36.8 C)  TempSrc: Oral  Oral Oral  SpO2:   92%   Weight:  86.1 kg    Height:        Intake/Output Summary (Last 24 hours) at 08/24/2022 1207 Last data filed at 08/24/2022 0400 Gross per 24  hour  Intake --  Output 1250 ml  Net -1250 ml   Filed Weights   08/22/22 0853 08/23/22 0500 08/24/22 0401  Weight: 86.2 kg 86.1 kg 86.1 kg    Examination:  Awake Alert, Oriented X 3, No new F.N deficits, Normal affect Symmetrical Chest wall movement, Good air movement bilaterally, scattered rales RRR,No Gallops,Rubs or new Murmurs, No Parasternal Heave +ve B.Sounds, Abd Soft, No tenderness, No rebound - guarding or  rigidity. Right AKA, left lower extremity CHRONIC skin changes, edema and digit amputations    Data Reviewed: I have personally reviewed following labs and imaging studies  CBC: Recent Labs  Lab 08/22/22 0907 08/22/22 1314 08/22/22 1438 08/23/22 0724 08/24/22 1013  WBC 15.5*  --  13.1* 11.9* 10.9*  NEUTROABS 13.9*  --   --   --   --   HGB 10.0* 8.8* 9.5* 9.1* 8.4*  HCT 32.9* 26.0* 30.0* 29.0* 26.9*  MCV 85.9  --  84.7 82.9 84.6  PLT 219  --  201 176 [id number]    Basic Metabolic Panel: Recent Labs  Lab 08/22/22 0907 08/22/22 1314 08/22/22 1438 08/23/22 0724 08/24/22 1013  NA 137 137  --  134* 135  K 4.7 4.2  --  4.4 4.1  CL 102  --   --  103 103  CO2 18*  --   --  20* 21*  GLUCOSE 187*  --   --  168* 161*  BUN 57*  --   --  60* 68*  CREATININE 3.87*  --  3.66* 3.50* 3.52*  CALCIUM 8.5*  --   --  8.1* 8.0*  MG  --   --   --  1.5*  --     GFR: Estimated Creatinine Clearance: 22.5 mL/min (A) (by C-G formula based on SCr of 3.52 mg/dL (H)).  Liver Function Tests: Recent Labs  Lab 08/22/22 0907  AST 48*  ALT 24  ALKPHOS 48  BILITOT 0.7  PROT 6.8  ALBUMIN 2.8*    CBG: Recent Labs  Lab 08/23/22 1216 08/23/22 1528 08/23/22 2228 08/24/22 0811 08/24/22 1132  GLUCAP 144* 136* 212* 144* 170*     Recent Results (from the past 240 hour(s))  Resp panel by RT-PCR (RSV, Flu A&B, Covid) Anterior Nasal Swab     Status: Abnormal   Collection Time: 08/22/22  8:56 AM   Specimen: Anterior Nasal Swab  Result Value Ref Range Status   SARS Coronavirus 2 by RT PCR NEGATIVE NEGATIVE Final   Influenza A by PCR POSITIVE (A) NEGATIVE Final   Influenza B by PCR NEGATIVE NEGATIVE Final    Comment: (NOTE) The Xpert Xpress SARS-CoV-2/FLU/RSV plus assay is intended as an aid in the diagnosis of influenza from Nasopharyngeal swab specimens and should not be used as a sole basis for treatment. Nasal washings and aspirates are unacceptable for Xpert Xpress  SARS-CoV-2/FLU/RSV testing.  Fact Sheet for Patients: EntrepreneurPulse.com.au  Fact Sheet for Healthcare Providers: IncredibleEmployment.be  This test is not yet approved or cleared by the [place] FDA and has been authorized for detection and/or diagnosis of SARS-CoV-2 by FDA under an Emergency Use Authorization (EUA). This EUA will remain in effect (meaning this test can be used) for the duration of the COVID-19 declaration under Section 564(b)(1) of the Act, 21 U.S.C. section 360bbb-3(b)(1), unless the authorization is terminated or revoked.     Resp Syncytial Virus by PCR NEGATIVE NEGATIVE Final    Comment: (NOTE) Fact Sheet for Patients: EntrepreneurPulse.com.au  Fact Sheet  for Healthcare Providers: IncredibleEmployment.be  This test is not yet approved or cleared by the [place] and has been authorized for detection and/or diagnosis of SARS-CoV-2 by FDA under an Emergency Use Authorization (EUA). This EUA will remain in effect (meaning this test can be used) for the duration of the COVID-19 declaration under Section 564(b)(1) of the Act, 21 U.S.C. section 360bbb-3(b)(1), unless the authorization is terminated or revoked.  Performed at [facility], [place] [address], [place], [place] [address]   Blood Culture (routine x 2)     Status: Abnormal (Preliminary result)   Collection Time: 08/22/22  8:56 AM   Specimen: BLOOD  Result Value Ref Range Status   Specimen Description BLOOD SITE NOT SPECIFIED  Final   Special Requests   Final    BOTTLES DRAWN AEROBIC AND ANAEROBIC Blood Culture results may not be optimal due to an inadequate volume of blood received in culture bottles   Culture  Setup Time   Final    GRAM POSITIVE COCCI IN CHAINS IN PAIRS AEROBIC BOTTLE ONLY CRITICAL RESULT CALLED TO, READ BACK BY AND VERIFIED WITH: PHARMD J. LEDFORD 08/23/22 @ 0159 BY AB    Culture (A)   Final    STREPTOCOCCUS GROUP G SUSCEPTIBILITIES TO FOLLOW Performed at [facility], [place] [address] [place], [place] [address]    Report Status PENDING  Incomplete  Blood Culture ID Panel (Reflexed)     Status: Abnormal   Collection Time: 08/22/22  8:56 AM  Result Value Ref Range Status   Enterococcus faecalis NOT DETECTED NOT DETECTED Final   Enterococcus Faecium NOT DETECTED NOT DETECTED Final   Listeria monocytogenes NOT DETECTED NOT DETECTED Final   Staphylococcus species NOT DETECTED NOT DETECTED Final   Staphylococcus aureus (BCID) NOT DETECTED NOT DETECTED Final   Staphylococcus epidermidis NOT DETECTED NOT DETECTED Final   Staphylococcus lugdunensis NOT DETECTED NOT DETECTED Final   Streptococcus species DETECTED (A) NOT DETECTED Final    Comment: Not Enterococcus species, Streptococcus agalactiae, Streptococcus pyogenes, or Streptococcus pneumoniae. CRITICAL RESULT CALLED TO, READ BACK BY AND VERIFIED WITH: PHARMD J. LEDFORD 08/23/22 @ 0159 BY AB    Streptococcus agalactiae NOT DETECTED NOT DETECTED Final   Streptococcus pneumoniae NOT DETECTED NOT DETECTED Final   Streptococcus pyogenes NOT DETECTED NOT DETECTED Final   A.calcoaceticus-baumannii NOT DETECTED NOT DETECTED Final   Bacteroides fragilis NOT DETECTED NOT DETECTED Final   Enterobacterales NOT DETECTED NOT DETECTED Final   Enterobacter cloacae complex NOT DETECTED NOT DETECTED Final   Escherichia coli NOT DETECTED NOT DETECTED Final   Klebsiella aerogenes NOT DETECTED NOT DETECTED Final   Klebsiella oxytoca NOT DETECTED NOT DETECTED Final   Klebsiella pneumoniae NOT DETECTED NOT DETECTED Final   Proteus species NOT DETECTED NOT DETECTED Final   Salmonella species NOT DETECTED NOT DETECTED Final   Serratia marcescens NOT DETECTED NOT DETECTED Final   Haemophilus influenzae NOT DETECTED NOT DETECTED Final   Neisseria meningitidis NOT DETECTED NOT DETECTED Final   Pseudomonas aeruginosa NOT DETECTED NOT  DETECTED Final   Stenotrophomonas maltophilia NOT DETECTED NOT DETECTED Final   Candida albicans NOT DETECTED NOT DETECTED Final   Candida auris NOT DETECTED NOT DETECTED Final   Candida glabrata NOT DETECTED NOT DETECTED Final   Candida krusei NOT DETECTED NOT DETECTED Final   Candida parapsilosis NOT DETECTED NOT DETECTED Final   Candida tropicalis NOT DETECTED NOT DETECTED Final   Cryptococcus neoformans/gattii NOT DETECTED NOT DETECTED Final  Comment: Performed at [facility], [place] [address], [place], [place] [address]  Remove and replace urinary cath (placed > 5 days) then obtain urine culture from new indwelling urinary catheter.     Status: None   Collection Time: 08/22/22  8:57 AM   Specimen: Urine, Catheterized  Result Value Ref Range Status   Specimen Description URINE, CATHETERIZED  Final   Special Requests NONE  Final   Culture   Final    NO GROWTH Performed at [facility], [address], [place], [name] [address]    Report Status 08/23/2022 FINAL  Final  Blood Culture (routine x 2)     Status: None (Preliminary result)   Collection Time: 08/22/22 11:35 AM   Specimen: BLOOD  Result Value Ref Range Status   Specimen Description BLOOD SITE NOT SPECIFIED  Final   Special Requests   Final    BOTTLES DRAWN AEROBIC AND ANAEROBIC Blood Culture adequate volume   Culture   Final    NO GROWTH 2 DAYS Performed at [facility], [place] [address], [place], [place] [address]    Report Status PENDING  Incomplete         Radiology Studies: VAS [place] LOWER EXTREMITY VENOUS (DVT)  Result Date: 08/23/2022  Lower Venous DVT Study Patient Name:  Gregory [name]  Date of Exam:   08/23/2022 Medical Rec #: [id number]     Accession #:    [id number] Date of Birth: 1960/12/18    Patient Gender: M Patient Age:   46 years Exam Location:  [facility] Procedure:      VAS [place] LOWER EXTREMITY VENOUS (DVT) Referring Phys: Wynetta Fines  --------------------------------------------------------------------------------  Indications: Pain, Swelling, and ulceration.  Risk Factors: Right Above knee amputation 11/19/18. Multiple left toe amputations Limitations: Poor ultrasound/tissue interface and [place] edema. Comparison Study: Prior negative left LEV done 07/05/21 Performing Technologist: Sharion Dove RVS  Examination Guidelines: A complete evaluation includes B-mode imaging, spectral Doppler, color Doppler, and power Doppler as needed of all accessible portions of each vessel. Bilateral testing is considered an integral part of a complete examination. Limited examinations for reoccurring indications may be performed as noted. The reflux portion of the exam is performed with the patient in reverse Trendelenburg.  +-------+---------------+---------+-----------+----------+--------------+ RIGHT  CompressibilityPhasicitySpontaneityPropertiesThrombus Aging +-------+---------------+---------+-----------+----------+--------------+ CFV    Full           Yes      Yes                                 +-------+---------------+---------+-----------+----------+--------------+ FV ProxFull                                                        +-------+---------------+---------+-----------+----------+--------------+ PFV    Full                                                        +-------+---------------+---------+-----------+----------+--------------+   +---------+---------------+---------+-----------+----------+-------------------+ LEFT     CompressibilityPhasicitySpontaneityPropertiesThrombus Aging      +---------+---------------+---------+-----------+----------+-------------------+ CFV      Full  Yes      Yes                                      +---------+---------------+---------+-----------+----------+-------------------+ SFJ      Full                                                              +---------+---------------+---------+-----------+----------+-------------------+ FV Prox  Full                                                             +---------+---------------+---------+-----------+----------+-------------------+ FV Mid   Full                                                             +---------+---------------+---------+-----------+----------+-------------------+ FV DistalFull                                                             +---------+---------------+---------+-----------+----------+-------------------+ PFV      Full                                                             +---------+---------------+---------+-----------+----------+-------------------+ POP      Full           Yes      Yes                                      +---------+---------------+---------+-----------+----------+-------------------+ PTV                                                   Not well visualized +---------+---------------+---------+-----------+----------+-------------------+ PERO                                                  Not well visualized +---------+---------------+---------+-----------+----------+-------------------+     Summary: RIGHT: - No evidence of common femoral vein obstruction.  LEFT: - There is no evidence of deep vein thrombosis in the lower extremity. However, portions of this examination were limited- see technologist comments above.  - Attempted left ABI/duplex of left PTA, ATA. However, skin/tissue properties prohibit  evaluation of the vessels of the calf/ankle - Ultrasound characteristics of enlarged, reactive lymph nodes noted in the groin.  *See table(s) above for measurements and observations. Electronically signed by Orlie Pollen on 08/23/2022 at 4:21:00 PM.    Final    MR FOOT LEFT WO CONTRAST  Result Date: 08/23/2022 CLINICAL DATA:  Osteomyelitis, foot EXAM: MRI OF THE LEFT FOOT WITHOUT CONTRAST TECHNIQUE:  Multiplanar, multisequence MR imaging of the left forefoot was performed. No intravenous contrast was administered. COMPARISON:  Left radiograph 08/22/2022 FINDINGS: Bones/Joint/Cartilage Prior partial first and second ray amputations. Prior partial third toe amputation. There is focal blunting of the fourth toe distal phalangeal tuft without marrow edema, likely chronic. There is chronic erosive changes at the third metatarsal head and arthritis of the third MTP joint. There is no significant associated marrow edema and there is preserved T1 marrow signal. Ligaments Intact Lisfranc ligament. Muscles and Tendons Chronic denervation changes in the foot. Soft tissues Severe diffuse skin thickening and soft tissue swelling of the foot. There is no organized fluid collection on noncontrast MRI. IMPRESSION: Severe diffuse skin thickening and soft tissue swelling of the foot, as can be seen cellulitis. No evidence soft tissue abscess on noncontrast MRI. Chronic erosive change at the third metatarsal head and arthritis of the third MTP joint. Focal blunting of the fourth toe distal phalangeal tuft without marrow edema, also likely chronic. No evidence of acute osteomyelitis. Electronically Signed   By: Maurine Simmering M.D.   On: 08/23/2022 11:43   DG Foot 2 Views Left  Result Date: 08/22/2022 CLINICAL DATA:  Cellulitis EXAM: LEFT FOOT - 2 VIEW COMPARISON:  Radiograph 06/03/2018 FINDINGS: Severe soft tissue swelling of the left foot and ankle. Prior partial first and second ray amputations. Prior partial third toe amputation. Osseous lucency in the third metatarsal head and articular surface irregularity of the third MTP joint. New blunting and cortical irregularity of the fourth toe distal phalangeal tuft. Focal lucency in the posterior calcaneus. IMPRESSION: Severe soft tissue swelling of the left foot and ankle. Osseous lucency in the third metatarsal head and articular surface irregularity of the third MTP joint, and new  blunting and cortical irregularity of the fourth toe distal phalangeal tuft, suspicious for osteomyelitis. Focal osseous lucency in the posterior calcaneus, correlate with presence of adjacent ulcer in the heel. Electronically Signed   By: Maurine Simmering M.D.   On: 08/22/2022 15:54   DG Tibia/Fibula Left  Result Date: 08/22/2022 CLINICAL DATA:  Cellulitis EXAM: LEFT TIBIA AND FIBULA - 2 VIEW COMPARISON:  None Available. FINDINGS: No acute osseous abnormality involving the left tibia or fibula. Alignment is normal. There is no bone erosion or frank bony destruction. There is severe soft tissue swelling of the left lower leg. IMPRESSION: Severe soft tissue swelling of the left lower leg. No acute osseous abnormality involving the left tibia or fibula. Electronically Signed   By: Maurine Simmering M.D.   On: 08/22/2022 15:48        Scheduled Meds:  azithromycin  500 mg Oral Daily   budesonide (PULMICORT) nebulizer solution  0.25 mg Nebulization BID   Chlorhexidine Gluconate Cloth  6 each Topical Daily   fluticasone  1 spray Each Nare Daily   gabapentin  100 mg Oral TID   heparin  5,000 Units Subcutaneous Q8H   insulin aspart  0-5 Units Subcutaneous QHS   insulin aspart  0-9 Units Subcutaneous TID WC   insulin glargine-yfgn  15 Units Subcutaneous Daily   ipratropium-albuterol  3 mL Nebulization BID   nicotine  21 mg Transdermal Daily   oseltamivir  30 mg Oral Daily   pantoprazole  40 mg Oral Daily   polyethylene glycol  17 g Oral Daily   predniSONE  40 mg Oral Q breakfast   senna-docusate  1 tablet Oral QHS   umeclidinium bromide  1 puff Inhalation Daily   Continuous Infusions:  piperacillin-tazobactam (ZOSYN)  IV 3.375 g (08/24/22 0903)   vancomycin 1,250 mg (08/24/22 1039)     LOS: 2 days        Phillips Climes, MD [facility]   To contact the attending provider between 7A-7P or the covering provider during after hours 7P-7A, please log into the web site [email or web address] and  access using universal [place] password for that web site. If you do not have the password, please call the hospital operator.  08/24/2022, 12:07 PM

## 2022-08-24 NOTE — Consult Note (Signed)
WOC consulted for lower extremity wounds.  See [place]  note from 08/22/2022 where wounds are addressed and wound care orders placed.   Thank you,    Aitan Rossbach MSN, RN-BC, [employer]

## 2022-08-24 NOTE — NC FL2 (Signed)
[place] LEVEL OF CARE FORM     IDENTIFICATION  Patient Name: Gregory [name] Birthdate: 1960-11-12 Sex: male Admission Date (Current Location): 08/22/2022  [facility] [place] Number:  Herbalist and Address:  [facility] [place], [place] [address], [place], [place] [address]      Provider Number: [id number]  Attending Physician Name and Address:  Elgergawy, Silver Huguenin, MD  Relative Name and Phone Number:       Current Level of Care: Hospital Recommended Level of Care: [place] Prior Approval Number:    Date Approved/Denied:   PASRR Number: [id number]  Discharge Plan: SNF    Current Diagnoses: Patient Active Problem List   Diagnosis Date Noted   Respiratory failure ([place]) 08/22/2022   Pressure injury of skin 08/22/2022   Influenza A 08/22/2022   Sepsis ([place]) 08/22/2022   Pneumonia 05/01/2022   Right upper lobe pneumonia 09/05/2021   Hyponatremia 09/05/2021   Anxiety 09/05/2021   BPH (benign prostatic hyperplasia) 09/05/2021   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   Asymptomatic bacteriuria 06/30/2021   Normocytic anemia 06/29/2021   Pain of amputation stump of right lower extremity ([place]) 03/15/2021   Melanoma of cheek ([place]) 10/15/2019   Acute hypoxic respiratory failure ([place]) 07/11/2019   Acute respiratory disease due to COVID-19 virus 07/11/2019   Wound infection 06/22/2019   Abscess 06/22/2019   Stage 3b chronic kidney disease (CKD) ([place]) 05/23/2019   Tobacco use 05/23/2019   Sepsis due to cellulitis ([place]) 05/23/2019   S/P AKA (above knee amputation) unilateral, right ([place]) 12/05/2018   Amputation stump infection ([place])    Lymphedema of left leg    Diabetic polyneuropathy associated with type 2 diabetes mellitus ([place])    Above knee amputation of right lower extremity ([place]) 10/22/2018   Dehiscence of amputation stump ([place])    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[place]) 08/12/2018   Candidiasis 08/12/2018    Necrosis of toe ([place]) 07/08/2018   Amputated toe of left foot ([place]) 06/18/2018   Streptococcal infection    Pressure injury, stage 3 ([place]) 06/06/2018   Cutaneous abscess of left foot    Moderate protein-calorie malnutrition ([place])    Septic arthritis ([place]) 06/03/2018   Idiopathic chronic venous hypertension of left lower extremity with ulcer and inflammation ([place]) 11/14/2017   Great toe amputation status, left 11/14/2017   Enlarged lymph node in neck 07/18/2017   Malignant melanoma of face ([place]) 04/16/2017   Osteomyelitis ([place]) 03/25/2017   Skin cancer 03/25/2017   Depression 11/05/2016   Diabetic ulcer of toe of left foot ([place]) 10/02/2016   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   Chronic pain 09/27/2016   DM2 (diabetes mellitus, type 2) ([place]) 06/06/2016   GERD without esophagitis 05/11/2016   Hypertensive heart disease with congestive heart failure ([place]) 12/15/2015   Spinal stenosis in cervical region 12/15/2015   Type II diabetes mellitus with neurological manifestations ([place]) 12/15/2015   Chronic diastolic HF (heart failure) ([place])    Pulmonary hypertension (HCC)    HTN (hypertension)    HLD (hyperlipidemia)    Urinary retention    Diskitis    Foot drop, bilateral    Sepsis with acute renal failure without septic shock (HCC)    COPD (chronic obstructive pulmonary disease) ([place]) 10/03/2015   Acute osteomyelitis of left foot ([place]) 10/03/2015   MRSA bacteremia 08/13/2015   Chronic low back pain 08/13/2015   Chronic multifocal osteomyelitis, multiple sites ([place]) 08/13/2015   Community acquired pneumonia 08/13/2015  Orientation RESPIRATION BLADDER Height & Weight     Self, Time, Situation, Place  O2 (6L nasal cannula) Continent Weight: 189 lb 13.1 oz (86.1 kg) Height:  '5\' 7"'$  (170.2 cm)  BEHAVIORAL SYMPTOMS/MOOD NEUROLOGICAL BOWEL NUTRITION STATUS      Continent Diet (See dc summary)  AMBULATORY STATUS COMMUNICATION OF NEEDS Skin   Extensive Assist Verbally PU Stage and  Appropriate Care (Stage II on ankle)                       Personal Care Assistance Level of Assistance  Bathing, Feeding, Dressing Bathing Assistance: Maximum assistance Feeding assistance: Limited assistance Dressing Assistance: Limited assistance     Functional Limitations Info             SPECIAL CARE FACTORS FREQUENCY                       Contractures Contractures Info: Not present    Additional Factors Info  Code Status, Allergies, Isolation Precautions Code Status Info: Full Allergies Info: NKA     Isolation Precautions Info: Influenza A+     Current Medications (08/24/2022):  This is the current hospital active medication list Current Facility-Administered Medications  Medication Dose Route Frequency Provider Last Rate Last Admin   acetaminophen (TYLENOL) tablet 650 mg  650 mg Oral Q6H PRN Margaretha Seeds, MD   650 mg at 08/23/22 0910   azithromycin (ZITHROMAX) tablet 500 mg  500 mg Oral Daily Wynetta Fines T, MD   500 mg at 08/24/22 0859   budesonide (PULMICORT) nebulizer solution 0.25 mg  0.25 mg Nebulization BID Wynetta Fines T, MD       Chlorhexidine Gluconate Cloth 2 % PADS 6 each  6 each Topical Daily Elgergawy, Silver Huguenin, MD   6 each at 08/24/22 0900   docusate sodium (COLACE) capsule 100 mg  100 mg Oral BID PRN Gleason, Otilio Carpen, [name]       fluticasone (FLONASE) 50 MCG/ACT nasal spray 1 spray  1 spray Each Nare Daily Wynetta Fines T, MD   1 spray at 08/24/22 0900   gabapentin (NEURONTIN) capsule 100 mg  100 mg Oral TID Wynetta Fines T, MD   100 mg at 08/24/22 0859   guaiFENesin tablet 200 mg  200 mg Oral Q6H PRN Wynetta Fines T, MD       heparin injection 5,000 Units  5,000 Units Subcutaneous Q8H Gleason, Otilio Carpen, [name]   5,000 Units at 08/24/22 0510   HYDROmorphone (DILAUDID) injection 0.5-1 mg  0.5-1 mg Intravenous Q2H PRN Wynetta Fines T, MD   1 mg at 08/24/22 1313   insulin aspart (novoLOG) injection 0-5 Units  0-5 Units Subcutaneous QHS Gleason, Otilio Carpen,  [name]   2 Units at 08/23/22 2228   insulin aspart (novoLOG) injection 0-9 Units  0-9 Units Subcutaneous TID WC Gleason, Otilio Carpen, [name]   2 Units at 08/24/22 1307   insulin glargine-yfgn (SEMGLEE) injection 15 Units  15 Units Subcutaneous Daily Wynetta Fines T, MD   15 Units at 08/24/22 0908   ipratropium-albuterol (DUONEB) 0.5-2.5 (3) MG/3ML nebulizer solution 3 mL  3 mL Nebulization BID Elgergawy, Silver Huguenin, MD       levalbuterol (XOPENEX) nebulizer solution 0.63 mg  0.63 mg Nebulization Q6H PRN Wynetta Fines T, MD       nicotine (NICODERM CQ - dosed in mg/24 hours) patch 21 mg  21 mg Transdermal Daily Lequita Halt, MD   21  mg at 08/24/22 0902   Oral care mouth rinse  15 mL Mouth Rinse PRN Elgergawy, Silver Huguenin, MD       oseltamivir (TAMIFLU) capsule 30 mg  30 mg Oral Daily Fransico Meadow, MD   30 mg at 08/24/22 [id number]   oxyCODONE (Oxy IR/ROXICODONE) immediate release tablet 20 mg  20 mg Oral Q8H PRN Elgergawy, Silver Huguenin, MD       oxymetazoline (AFRIN) 0.05 % nasal spray 1 spray  1 spray Each Nare BID PRN Opyd, Ilene Qua, MD   1 spray at 08/22/22 2023   pantoprazole (PROTONIX) EC tablet 40 mg  40 mg Oral Daily Wynetta Fines T, MD   40 mg at 08/24/22 0859   piperacillin-tazobactam (ZOSYN) IVPB 3.375 g  3.375 g Intravenous Q8H Pham, Minh Q, RPH-CPP 12.5 mL/hr at 08/24/22 0903 3.375 g at 08/24/22 0903   polyethylene glycol (MIRALAX / GLYCOLAX) packet 17 g  17 g Oral Daily PRN Gleason, Otilio Carpen, [name]       polyethylene glycol (MIRALAX / GLYCOLAX) packet 17 g  17 g Oral Daily Wynetta Fines T, MD   17 g at 08/23/22 0830   predniSONE (DELTASONE) tablet 40 mg  40 mg Oral Q breakfast Gleason, Otilio Carpen, [name]   40 mg at 08/24/22 0859   senna-docusate (Senokot-S) tablet 1 tablet  1 tablet Oral QHS Wynetta Fines T, MD   1 tablet at 08/23/22 2116   umeclidinium bromide (INCRUSE ELLIPTA) 62.5 MCG/ACT 1 puff  1 puff Inhalation Daily Lequita Halt, MD   1 puff at 08/24/22 [id number]     Discharge Medications: Please see discharge  summary for a list of discharge medications.  Relevant Imaging Results:  Relevant Lab Results:   Additional Information SS# [id number];  Benard Halsted, LCSW

## 2022-08-25 DIAGNOSIS — J111 Influenza due to unidentified influenza virus with other respiratory manifestations: Secondary | ICD-10-CM | POA: Diagnosis not present

## 2022-08-25 DIAGNOSIS — A419 Sepsis, unspecified organism: Secondary | ICD-10-CM | POA: Diagnosis not present

## 2022-08-25 LAB — CBC
HCT: 25.2 % — ABNORMAL LOW (ref 39.0–52.0)
Hemoglobin: 7.6 g/dL — ABNORMAL LOW (ref 13.0–17.0)
MCH: 25.8 pg — ABNORMAL LOW (ref 26.0–34.0)
MCHC: 30.2 g/dL (ref 30.0–36.0)
MCV: 85.4 fL (ref 80.0–100.0)
Platelets: 156 10*3/uL (ref 150–400)
RBC: 2.95 MIL/uL — ABNORMAL LOW (ref 4.22–5.81)
RDW: 15.5 % (ref 11.5–15.5)
WBC: 9.1 10*3/uL (ref 4.0–10.5)
nRBC: 0 % (ref 0.0–0.2)

## 2022-08-25 LAB — CULTURE, BLOOD (ROUTINE X 2)

## 2022-08-25 LAB — BRAIN NATRIURETIC PEPTIDE: B Natriuretic Peptide: 214.2 pg/mL — ABNORMAL HIGH (ref 0.0–100.0)

## 2022-08-25 LAB — BASIC METABOLIC PANEL
Anion gap: 12 (ref 5–15)
BUN: 70 mg/dL — ABNORMAL HIGH (ref 8–23)
CO2: 22 mmol/L (ref 22–32)
Calcium: 8.4 mg/dL — ABNORMAL LOW (ref 8.9–10.3)
Chloride: 102 mmol/L (ref 98–111)
Creatinine, Ser: 3.47 mg/dL — ABNORMAL HIGH (ref 0.61–1.24)
GFR, Estimated: 19 mL/min — ABNORMAL LOW (ref >60)
Glucose, Bld: 236 mg/dL — ABNORMAL HIGH (ref 70–99)
Potassium: 4.4 mmol/L (ref 3.5–5.1)
Sodium: 136 mmol/L (ref 135–145)

## 2022-08-25 LAB — GLUCOSE, CAPILLARY
Glucose-Capillary: 149 mg/dL — ABNORMAL HIGH (ref 70–99)
Glucose-Capillary: 206 mg/dL — ABNORMAL HIGH (ref 70–99)
Glucose-Capillary: 381 mg/dL — ABNORMAL HIGH (ref 70–99)

## 2022-08-25 MED ORDER — SODIUM CHLORIDE 0.9 % IV SOLN
2.0000 g | INTRAVENOUS | Status: DC
  Administered 2022-08-25 – 2022-08-26 (×2): 2 g via INTRAVENOUS
  Filled 2022-08-25 (×3): qty 20

## 2022-08-25 MED ORDER — MIRABEGRON ER 50 MG PO TB24
50.0000 mg | ORAL_TABLET | Freq: Every day | ORAL | Status: DC
  Administered 2022-08-25 – 2022-08-27 (×3): 50 mg via ORAL
  Filled 2022-08-25 (×3): qty 1

## 2022-08-25 MED ORDER — TAMSULOSIN HCL 0.4 MG PO CAPS
0.8000 mg | ORAL_CAPSULE | Freq: Every day | ORAL | Status: DC
  Administered 2022-08-25 – 2022-08-27 (×3): 0.8 mg via ORAL
  Filled 2022-08-25 (×3): qty 2

## 2022-08-25 MED ORDER — OXYBUTYNIN CHLORIDE ER 10 MG PO TB24
10.0000 mg | ORAL_TABLET | Freq: Every day | ORAL | Status: DC
  Administered 2022-08-25 – 2022-08-27 (×3): 10 mg via ORAL
  Filled 2022-08-25 (×4): qty 1

## 2022-08-25 MED ORDER — LORAZEPAM 1 MG PO TABS
1.0000 mg | ORAL_TABLET | Freq: Four times a day (QID) | ORAL | Status: AC | PRN
  Administered 2022-08-25 – 2022-08-27 (×4): 1 mg via ORAL
  Filled 2022-08-25 (×4): qty 1

## 2022-08-25 MED ORDER — ROPINIROLE HCL 1 MG PO TABS
2.0000 mg | ORAL_TABLET | Freq: Two times a day (BID) | ORAL | Status: DC
  Administered 2022-08-25 – 2022-08-27 (×5): 2 mg via ORAL
  Filled 2022-08-25 (×5): qty 2

## 2022-08-25 MED ORDER — ADULT MULTIVITAMIN W/MINERALS CH
1.0000 | ORAL_TABLET | Freq: Every day | ORAL | Status: DC
  Administered 2022-08-25 – 2022-08-27 (×3): 1 via ORAL
  Filled 2022-08-25 (×3): qty 1

## 2022-08-25 MED ORDER — SODIUM CHLORIDE 0.9% IV SOLUTION: Freq: Once | INTRAVENOUS | Status: DC

## 2022-08-25 MED ORDER — ROSUVASTATIN CALCIUM 5 MG PO TABS
10.0000 mg | ORAL_TABLET | Freq: Every day | ORAL | Status: DC
  Administered 2022-08-25 – 2022-08-26 (×2): 10 mg via ORAL
  Filled 2022-08-25 (×2): qty 2

## 2022-08-25 MED ORDER — FUROSEMIDE 10 MG/ML IJ SOLN
40.0000 mg | Freq: Once | INTRAMUSCULAR | Status: AC
  Administered 2022-08-25: 40 mg via INTRAVENOUS
  Filled 2022-08-25: qty 4

## 2022-08-25 NOTE — Progress Notes (Signed)
PROGRESS NOTE    Gregory [name]  [id number] DOB: January 18, 1961 DOA: 08/22/2022 PCP: [name], MD    Chief Complaint  Patient presents with   Code Sepsis    Brief Narrative:   Gregory [name] is a 62 y.o. 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  Chest x-ray showed multifocal pneumonia.  Blood work showed WBC 15, hemoglobin 10.0, 18, creatinine is 3.87 compared to baseline 1.8-2.0.  UA showed no signs of UTI   Assessment & Plan:   Principal Problem:   Respiratory failure ([place]) Active Problems:   Sepsis due to cellulitis ([facility])   Pressure injury of skin   Influenza A   Sepsis ([place])   Severe sepsis due to left lower extremity cellulitis and multifocal pneumonia and Streptococcus group G bacteremia -Evidenced by tachycardia, elevated lactate, fever, likely infectious source is left leg cellulitis plus minus multifocal pneumonia. -Continue fluid resuscitation, plan for continuing broad-spectrum antibiotics vancomycin and cefepime plus azithromycin to cover left leg cellulitis and multifocal pneumonia. -No evidence of acute osteomyelitis on MRI - bacteremia most likely related to his significant cellulitis, currently susceptibilities available, will narrow Zosyn to Rocephin.   Acute on chronic hypoxic respiratory failure Acute COPD exacerbation -Multifocal pneumonia  -influenza A pneumonia -Continue with Tamiflu -Continue with antibiotic coverage for pneumonia -He was encouraged to use incentive spirometry and flutter valve -Continue home medication of ICS, LABA, Spiriva DuoNebscare consulted -He is on 2 L nasal cannula at baseline,  Left leg cellulitis -he has a new onset of left leg pain  and rash, more than one third of surface area of the left leg involved with cellulitis, plan to treat with IV antibiotics as above -No evidence of DVT -Wound care consulted -Continue with IV antibiotics, narrow Zosyn to Rocephin now culture data available -Patient with left lower extremity wound in the ankle area, reports this is postop with recent benign tumor resection   AKI on CKD stage IIIb -Likely prerenal secondary to sepsis, IV resuscitation as above -Cut down Lantus, oxycodone and gabapentin dosage due to AKI -Foley catheter inserted in ED, continued overnight, no evidence of retention.   IDDM with hyperglycemia -Continue Lantus and sliding scale -Cut down Lantus dosage due to AKI   Chronic HFpEF -Hold off Lasix as patient is in sepsis and AKI   Cigarette smoke -Cessation education performed at bedside, -Nicotine patch for   GERD, chronic pain syndrome -Stable, continue with home(SNF) regimen  Anemia due to  chronic kidney disease -hemoglobin slowly trending down, it is 7.7 today, will give 1 unit PRBC   DVT prophylaxis: Heparin Code Status: Full Family Communication: None at bedside Disposition: Back to SNF in 48 hours  Status is: Inpatient    Consultants:  None   Subjective:  He denies any complaints today, he is afebrile over last 2 days.  Foley catheter discontinued overnight with no evidence of retention.  Objective: Vitals:   08/25/22 0036 08/25/22 0428 08/25/22 0430 08/25/22 0752  BP: 117/75 (!) 127/58  133/66  Pulse: 79 79    Resp: 12 15    Temp:  97.6 F (36.4 C)  97.6 F (36.4 C)  TempSrc:  Axillary  Oral  SpO2:  Weight:   86.1 kg   Height:        Intake/Output Summary (Last 24 hours) at 08/25/2022 1157 Last data filed at 08/25/2022 0700 Gross per 24 hour  Intake --  Output 1300 ml  Net -1300 ml   Filed Weights   08/23/22 0500 08/24/22 0401 08/25/22 0430  Weight: 86.1 kg 86.1 kg 86.1 kg    Examination:  Awake Alert, Oriented  X 3, No new F.N deficits, Normal affect Symmetrical Chest wall movement, Good air movement bilaterally RRR,No Gallops,Rubs or new Murmurs, No Parasternal Heave +ve B.Sounds, Abd Soft, No tenderness, No rebound - guarding or rigidity. Right AKA, left lower extremity CHRONIC skin changes, edema and digit amputations    Data Reviewed: I have personally reviewed following labs and imaging studies  CBC: Recent Labs  Lab 08/22/22 0907 08/22/22 1314 08/22/22 1438 08/23/22 0724 08/24/22 1013 08/25/22 0309  WBC 15.5*  --  13.1* 11.9* 10.9* 9.1  NEUTROABS 13.9*  --   --   --   --   --   HGB 10.0* 8.8* 9.5* 9.1* 8.4* 7.6*  HCT 32.9* 26.0* 30.0* 29.0* 26.9* 25.2*  MCV 85.9  --  84.7 82.9 84.6 85.4  PLT 219  --  201 176 154 [id number]    Basic Metabolic Panel: Recent Labs  Lab 08/22/22 0907 08/22/22 1314 08/22/22 1438 08/23/22 0724 08/24/22 1013 08/25/22 0309  NA 137 137  --  134* 135 136  K 4.7 4.2  --  4.4 4.1 4.4  CL 102  --   --  103 103 102  CO2 18*  --   --  20* 21* 22  GLUCOSE 187*  --   --  168* 161* 236*  BUN 57*  --   --  60* 68* 70*  CREATININE 3.87*  --  3.66* 3.50* 3.52* 3.47*  CALCIUM 8.5*  --   --  8.1* 8.0* 8.4*  MG  --   --   --  1.5*  --   --     GFR: Estimated Creatinine Clearance: 22.8 mL/min (A) (by C-G formula based on SCr of 3.47 mg/dL (H)).  Liver Function Tests: Recent Labs  Lab 08/22/22 0907  AST 48*  ALT 24  ALKPHOS 48  BILITOT 0.7  PROT 6.8  ALBUMIN 2.8*    CBG: Recent Labs  Lab 08/23/22 1528 08/23/22 2228 08/24/22 0811 08/24/22 1132 08/25/22 0752  GLUCAP 136* 212* 144* 170* 149*     Recent Results (from the past 240 hour(s))  Resp panel by RT-PCR (RSV, Flu A&B, Covid) Anterior Nasal Swab     Status: Abnormal   Collection Time: 08/22/22  8:56 AM   Specimen: Anterior Nasal Swab  Result Value Ref Range Status   SARS Coronavirus 2 by RT PCR NEGATIVE NEGATIVE Final   Influenza A by PCR POSITIVE (A) NEGATIVE Final   Influenza B by  PCR NEGATIVE NEGATIVE Final    Comment: (NOTE) The Xpert Xpress SARS-CoV-2/FLU/RSV plus assay is intended as an aid in the diagnosis of influenza from Nasopharyngeal swab specimens and should not be used as a sole basis for treatment. Nasal washings and aspirates are unacceptable for Xpert Xpress SARS-CoV-2/FLU/RSV testing.  Fact Sheet for Patients: EntrepreneurPulse.com.au  Fact Sheet for Healthcare Providers: IncredibleEmployment.be  This test is not yet approved or cleared by the [place] FDA and has been authorized for detection and/or diagnosis of SARS-CoV-2 by FDA under an Emergency Use Authorization (EUA). This EUA will remain in effect (meaning this  test can be used) for the duration of the COVID-19 declaration under Section 564(b)(1) of the Act, 21 U.S.C. section 360bbb-3(b)(1), unless the authorization is terminated or revoked.     Resp Syncytial Virus by PCR NEGATIVE NEGATIVE Final    Comment: (NOTE) Fact Sheet for Patients: EntrepreneurPulse.com.au  Fact Sheet for Healthcare Providers: IncredibleEmployment.be  This test is not yet approved or cleared by the [place] FDA and has been authorized for detection and/or diagnosis of SARS-CoV-2 by FDA under an Emergency Use Authorization (EUA). This EUA will remain in effect (meaning this test can be used) for the duration of the COVID-19 declaration under Section 564(b)(1) of the Act, 21 U.S.C. section 360bbb-3(b)(1), unless the authorization is terminated or revoked.  Performed at [facility], [place] [address] [place], [place] [address]   Blood Culture (routine x 2)     Status: Abnormal   Collection Time: 08/22/22  8:56 AM   Specimen: BLOOD  Result Value Ref Range Status   Specimen Description BLOOD SITE NOT SPECIFIED  Final   Special Requests   Final    BOTTLES DRAWN AEROBIC AND ANAEROBIC Blood Culture results may not be  optimal due to an inadequate volume of blood received in culture bottles   Culture  Setup Time   Final    GRAM POSITIVE COCCI IN CHAINS IN PAIRS AEROBIC BOTTLE ONLY CRITICAL RESULT CALLED TO, READ BACK BY AND VERIFIED WITH: PHARMD J. LEDFORD 08/23/22 @ 0159 BY AB Performed at [facility], [place] [address], [place], [place] [address]    Culture STREPTOCOCCUS GROUP G (A)  Final   Report Status 08/25/2022 FINAL  Final   Organism ID, Bacteria STREPTOCOCCUS GROUP G  Final      Susceptibility   Streptococcus group g - MIC*    CLINDAMYCIN >=1 RESISTANT Resistant     AMPICILLIN <=0.25 SENSITIVE Sensitive     ERYTHROMYCIN >=8 RESISTANT Resistant     VANCOMYCIN 0.5 SENSITIVE Sensitive     CEFTRIAXONE <=0.12 SENSITIVE Sensitive     LEVOFLOXACIN 0.5 SENSITIVE Sensitive     PENICILLIN <=0.06 SENSITIVE Sensitive     * STREPTOCOCCUS GROUP G  Blood Culture ID Panel (Reflexed)     Status: Abnormal   Collection Time: 08/22/22  8:56 AM  Result Value Ref Range Status   Enterococcus faecalis NOT DETECTED NOT DETECTED Final   Enterococcus Faecium NOT DETECTED NOT DETECTED Final   Listeria monocytogenes NOT DETECTED NOT DETECTED Final   Staphylococcus species NOT DETECTED NOT DETECTED Final   Staphylococcus aureus (BCID) NOT DETECTED NOT DETECTED Final   Staphylococcus epidermidis NOT DETECTED NOT DETECTED Final   Staphylococcus lugdunensis NOT DETECTED NOT DETECTED Final   Streptococcus species DETECTED (A) NOT DETECTED Final    Comment: Not Enterococcus species, Streptococcus agalactiae, Streptococcus pyogenes, or Streptococcus pneumoniae. CRITICAL RESULT CALLED TO, READ BACK BY AND VERIFIED WITH: PHARMD J. LEDFORD 08/23/22 @ 0159 BY AB    Streptococcus agalactiae NOT DETECTED NOT DETECTED Final   Streptococcus pneumoniae NOT DETECTED NOT DETECTED Final   Streptococcus pyogenes NOT DETECTED NOT DETECTED Final   A.calcoaceticus-baumannii NOT DETECTED NOT DETECTED Final   Bacteroides fragilis NOT  DETECTED NOT DETECTED Final   Enterobacterales NOT DETECTED NOT DETECTED Final   Enterobacter cloacae complex NOT DETECTED NOT DETECTED Final   Escherichia coli NOT DETECTED NOT DETECTED Final   Klebsiella aerogenes NOT DETECTED NOT DETECTED Final   Klebsiella oxytoca NOT DETECTED NOT DETECTED Final   Klebsiella pneumoniae NOT DETECTED NOT  DETECTED Final   Proteus species NOT DETECTED NOT DETECTED Final   Salmonella species NOT DETECTED NOT DETECTED Final   Serratia marcescens NOT DETECTED NOT DETECTED Final   Haemophilus influenzae NOT DETECTED NOT DETECTED Final   Neisseria meningitidis NOT DETECTED NOT DETECTED Final   Pseudomonas aeruginosa NOT DETECTED NOT DETECTED Final   Stenotrophomonas maltophilia NOT DETECTED NOT DETECTED Final   Candida albicans NOT DETECTED NOT DETECTED Final   Candida auris NOT DETECTED NOT DETECTED Final   Candida glabrata NOT DETECTED NOT DETECTED Final   Candida krusei NOT DETECTED NOT DETECTED Final   Candida parapsilosis NOT DETECTED NOT DETECTED Final   Candida tropicalis NOT DETECTED NOT DETECTED Final   Cryptococcus neoformans/gattii NOT DETECTED NOT DETECTED Final    Comment: Performed at [facility], [place] [address] [place], [place] [address]  Remove and replace urinary cath (placed > 5 days) then obtain urine culture from new indwelling urinary catheter.     Status: None   Collection Time: 08/22/22  8:57 AM   Specimen: Urine, Catheterized  Result Value Ref Range Status   Specimen Description URINE, CATHETERIZED  Final   Special Requests NONE  Final   Culture   Final    NO GROWTH Performed at [facility], [address] [place], [place] [address]    Report Status 08/23/2022 FINAL  Final  Blood Culture (routine x 2)     Status: None (Preliminary result)   Collection Time: 08/22/22 11:35 AM   Specimen: BLOOD  Result Value Ref Range Status   Specimen Description BLOOD SITE NOT SPECIFIED  Final   Special Requests   Final     BOTTLES DRAWN AEROBIC AND ANAEROBIC Blood Culture adequate volume   Culture   Final    NO GROWTH 3 DAYS Performed at [facility], [place] [address] [place], [place] [address]    Report Status PENDING  Incomplete  Culture, blood (single) w Reflex to ID Panel     Status: None (Preliminary result)   Collection Time: 08/24/22  6:44 PM   Specimen: BLOOD RIGHT HAND  Result Value Ref Range Status   Specimen Description BLOOD RIGHT HAND  Final   Special Requests   Final    BOTTLES DRAWN AEROBIC AND ANAEROBIC Blood Culture results may not be optimal due to an excessive volume of blood received in culture bottles   Culture   Final    NO GROWTH < 12 HOURS Performed at [facility], [place] [address] [place], [place] [address]    Report Status PENDING  Incomplete         Radiology Studies: VAS [place] LOWER EXTREMITY VENOUS (DVT)  Result Date: 08/23/2022  Lower Venous DVT Study Patient Name:  DENARI MANGLICMOT  Date of Exam:   08/23/2022 Medical Rec #: [id number]     Accession #:    [id number] Date of Birth: 1960/11/15    Patient Gender: M Patient Age:   51 years Exam Location:  [facility] Procedure:      VAS [place] LOWER EXTREMITY VENOUS (DVT) Referring Phys: Wynetta Fines --------------------------------------------------------------------------------  Indications: Pain, Swelling, and ulceration.  Risk Factors: Right Above knee amputation 11/19/18. Multiple left toe amputations Limitations: Poor ultrasound/tissue interface and Woody edema. Comparison Study: Prior negative left LEV done 07/05/21 Performing Technologist: Sharion Dove RVS  Examination Guidelines: A complete evaluation includes B-mode imaging, spectral Doppler, color Doppler, and power Doppler as needed of all accessible portions of each vessel.  Bilateral testing is considered an integral part of a complete examination. Limited examinations for reoccurring indications may be performed as noted. The reflux portion of the exam is performed  with the patient in reverse Trendelenburg.  +-------+---------------+---------+-----------+----------+--------------+ RIGHT  CompressibilityPhasicitySpontaneityPropertiesThrombus Aging +-------+---------------+---------+-----------+----------+--------------+ CFV    Full           Yes      Yes                                 +-------+---------------+---------+-----------+----------+--------------+ FV ProxFull                                                        +-------+---------------+---------+-----------+----------+--------------+ PFV    Full                                                        +-------+---------------+---------+-----------+----------+--------------+   +---------+---------------+---------+-----------+----------+-------------------+ LEFT     CompressibilityPhasicitySpontaneityPropertiesThrombus Aging      +---------+---------------+---------+-----------+----------+-------------------+ CFV      Full           Yes      Yes                                      +---------+---------------+---------+-----------+----------+-------------------+ SFJ      Full                                                             +---------+---------------+---------+-----------+----------+-------------------+ FV Prox  Full                                                             +---------+---------------+---------+-----------+----------+-------------------+ FV Mid   Full                                                             +---------+---------------+---------+-----------+----------+-------------------+ FV DistalFull                                                             +---------+---------------+---------+-----------+----------+-------------------+ PFV      Full                                                             +---------+---------------+---------+-----------+----------+-------------------+  POP      Full            Yes      Yes                                      +---------+---------------+---------+-----------+----------+-------------------+ PTV                                                   Not well visualized +---------+---------------+---------+-----------+----------+-------------------+ PERO                                                  Not well visualized +---------+---------------+---------+-----------+----------+-------------------+     Summary: RIGHT: - No evidence of common femoral vein obstruction.  LEFT: - There is no evidence of deep vein thrombosis in the lower extremity. However, portions of this examination were limited- see technologist comments above.  - Attempted left ABI/duplex of left PTA, ATA. However, skin/tissue properties prohibit evaluation of the vessels of the calf/ankle - Ultrasound characteristics of enlarged, reactive lymph nodes noted in the groin.  *See table(s) above for measurements and observations. Electronically signed by Orlie Pollen on 08/23/2022 at 4:21:00 PM.    Final         Scheduled Meds:  sodium chloride   Intravenous Once   azithromycin  500 mg Oral Daily   budesonide (PULMICORT) nebulizer solution  0.25 mg Nebulization BID   Chlorhexidine Gluconate Cloth  6 each Topical Daily   fluticasone  1 spray Each Nare Daily   gabapentin  100 mg Oral TID   heparin  5,000 Units Subcutaneous Q8H   insulin aspart  0-5 Units Subcutaneous QHS   insulin aspart  0-9 Units Subcutaneous TID WC   insulin glargine-yfgn  15 Units Subcutaneous Daily   ipratropium-albuterol  3 mL Nebulization BID   mirabegron ER  50 mg Oral Daily   multivitamin with minerals  1 tablet Oral Daily   nicotine  21 mg Transdermal Daily   oseltamivir  30 mg Oral Daily   oxybutynin  10 mg Oral Daily   pantoprazole  40 mg Oral Daily   polyethylene glycol  17 g Oral Daily   predniSONE  40 mg Oral Q breakfast   rOPINIRole  2 mg Oral BID   rosuvastatin  10 mg Oral QHS    senna-docusate  1 tablet Oral QHS   tamsulosin  0.8 mg Oral Daily   umeclidinium bromide  1 puff Inhalation Daily   Continuous Infusions:  piperacillin-tazobactam (ZOSYN)  IV 3.375 g (08/25/22 1024)     LOS: 3 days        Phillips Climes, MD [facility]   To contact the attending provider between 7A-7P or the covering provider during after hours 7P-7A, please log into the web site [email or web address] and access using universal [place] password for that web site. If you do not have the password, please call the hospital operator.  08/25/2022, 11:57 AM

## 2022-08-25 NOTE — Progress Notes (Signed)
IV team placed a 20g to R forearm, IV team not able to collect type and screen. Lab attempted to collected type and screen, however patient send them away telling lab "they did not know what they were doing." Several attempts have been made to collect needed type and screen from this patient that refused to cooperate. Attending provider made aware via secure chat

## 2022-08-25 NOTE — Progress Notes (Signed)
Pt is refusing for lab to draw a type and screen and also for another PIV to be placed, which will be needed for the ordered blood transfusion. Pt adamantly refusing and yelling and cursing at staff. MD made aware.

## 2022-08-25 NOTE — Progress Notes (Signed)
PRN Ativan PO administered, lab came to the room to attempt to collect type and screen. Patient not agreeable to lab and ask for lab to come back at a later time.

## 2022-08-26 DIAGNOSIS — A419 Sepsis, unspecified organism: Secondary | ICD-10-CM | POA: Diagnosis not present

## 2022-08-26 DIAGNOSIS — L0390 Cellulitis, unspecified: Secondary | ICD-10-CM | POA: Diagnosis not present

## 2022-08-26 DIAGNOSIS — J111 Influenza due to unidentified influenza virus with other respiratory manifestations: Secondary | ICD-10-CM | POA: Diagnosis not present

## 2022-08-26 LAB — GLUCOSE, CAPILLARY
Glucose-Capillary: 149 mg/dL — ABNORMAL HIGH (ref 70–99)
Glucose-Capillary: 200 mg/dL — ABNORMAL HIGH (ref 70–99)
Glucose-Capillary: 381 mg/dL — ABNORMAL HIGH (ref 70–99)
Glucose-Capillary: 321 mg/dL — ABNORMAL HIGH (ref 70–99)

## 2022-08-26 LAB — CBC
HCT: 31.3 % — ABNORMAL LOW (ref 39.0–52.0)
Hemoglobin: 9.6 g/dL — ABNORMAL LOW (ref 13.0–17.0)
MCH: 26.1 pg (ref 26.0–34.0)
MCHC: 30.7 g/dL (ref 30.0–36.0)
MCV: 85.1 fL (ref 80.0–100.0)
Platelets: 165 10*3/uL (ref 150–400)
RBC: 3.68 MIL/uL — ABNORMAL LOW (ref 4.22–5.81)
RDW: 15.1 % (ref 11.5–15.5)
WBC: 7.5 10*3/uL (ref 4.0–10.5)
nRBC: 0 % (ref 0.0–0.2)

## 2022-08-26 LAB — BASIC METABOLIC PANEL
Anion gap: 12 (ref 5–15)
BUN: 62 mg/dL — ABNORMAL HIGH (ref 8–23)
CO2: 21 mmol/L — ABNORMAL LOW (ref 22–32)
Calcium: 8.8 mg/dL — ABNORMAL LOW (ref 8.9–10.3)
Chloride: 103 mmol/L (ref 98–111)
Creatinine, Ser: 2.72 mg/dL — ABNORMAL HIGH (ref 0.61–1.24)
GFR, Estimated: 25 mL/min — ABNORMAL LOW (ref >60)
Glucose, Bld: 186 mg/dL — ABNORMAL HIGH (ref 70–99)
Potassium: 4 mmol/L (ref 3.5–5.1)
Sodium: 136 mmol/L (ref 135–145)

## 2022-08-26 MED ORDER — OXYMETAZOLINE HCL 0.05 % NA SOLN
1.0000 | Freq: Two times a day (BID) | NASAL | Status: AC
  Administered 2022-08-26 – 2022-08-27 (×3): 1 via NASAL
  Filled 2022-08-26: qty 30

## 2022-08-26 MED ORDER — INSULIN GLARGINE-YFGN 100 UNIT/ML ~~LOC~~ SOLN
20.0000 [IU] | Freq: Every day | SUBCUTANEOUS | Status: DC
  Administered 2022-08-26 – 2022-08-27 (×2): 20 [IU] via SUBCUTANEOUS
  Filled 2022-08-26 (×2): qty 0.2

## 2022-08-26 NOTE — Progress Notes (Signed)
Patient took his afrin and has refused to give it back to Probation officer to put away.  MD made aware.  Patient also requesting to leave to return to facility today informed him that he would be laving tomorrow per MD.  Patient continues to argue stating they would take him today he would call and prove it.  Informed him he could leave AMA if he wanted to.

## 2022-08-26 NOTE — Progress Notes (Signed)
PROGRESS NOTE    Gregory [name]  [name] DOB: 1960/07/09 DOA: 08/22/2022 PCP: [name], MD    Chief Complaint  Patient presents with   Code Sepsis    Brief Narrative:   Gregory [name] is a 62 y.o. 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  Chest x-ray showed multifocal pneumonia.  Blood work showed WBC 15, hemoglobin 10.0, 18, creatinine is 3.87 compared to baseline 1.8-2.0.  UA showed no signs of UTI   Assessment & Plan:   Principal Problem:   Respiratory failure ([place]) Active Problems:   Sepsis due to cellulitis (HCC)   Pressure injury of skin   Influenza A   Sepsis ([place])   Severe sepsis due to left lower extremity cellulitis and multifocal pneumonia and Streptococcus group G bacteremia -Evidenced by tachycardia, elevated lactate, fever, likely infectious source is left leg cellulitis plus minus multifocal pneumonia. -started broad-spectrum antibiotics vancomycin and cefepime plus azithromycin to cover left leg cellulitis and multifocal pneumonia. -No evidence of acute osteomyelitis on MRI - bacteremia most likely related to his significant cellulitis, currently susceptibilities available, will narrow Zosyn to Rocephin. -Prescribed azithromycin for his pneumonia, will need total 2 weeks of antibiotics for his bacteremia.  Can transition to Augmentin on discharge.   Acute on chronic hypoxic respiratory failure Acute COPD exacerbation -Multifocal pneumonia  -influenza A pneumonia -Continue with Tamiflu -Continue with antibiotic coverage for pneumonia -He was encouraged to use incentive spirometry and flutter valve -Continue home medication of ICS, LABA, Spiriva DuoNebscare  consulted -He is on 2 L nasal cannula at baseline,  Left leg cellulitis -he has a new onset of left leg pain and rash, more than one third of surface area of the left leg involved with cellulitis, plan to treat with IV antibiotics as above -No evidence of DVT -Wound care consulted -Continue with IV antibiotics, narrow Zosyn to Rocephin now culture data available -Patient with left lower extremity wound in the ankle area, reports this is postop with recent benign tumor resection   AKI on CKD stage IIIb -Likely prerenal secondary to sepsis, IV resuscitation as above -Foley catheter inserted in ED, it was discontinued, no evidence of retention. -Creatinine improved today to 2.72.   IDDM with hyperglycemia -Continue Lantus and sliding scale   Chronic HFpEF -Hold off Lasix as patient is in sepsis and AKI   Cigarette smoke -Cessation education performed at bedside, -Nicotine patch for   GERD, chronic pain syndrome -Stable, continue with home(SNF) regimen  Anemia due to  chronic kidney disease -Hemoglobin is stable at 9.6 today.   DVT prophylaxis: Heparin Code Status: Full Family Communication: None at bedside Disposition: Back to SNF in 48 hours  Status is: Inpatient    Consultants:  None   Subjective:  No significant events overnight, he denies any complaints today, no urinary retention since Foley catheter discontinued couple days ago, patient reports he is feeling much better today.  Objective: Vitals:   08/25/22 1203 08/25/22 1956 08/26/22 0400 08/26/22 0813  BP: 137/71 133/68 128/62 123/81  Pulse: 82 83  90  Resp:  '16 20 16  '$ Temp: 97.6 F (36.4 C) 98.4 F (36.9 C) 98.7 F (37.1 C) 98.3 F (36.8 C)  TempSrc: Oral Oral  Oral Oral  SpO2: 92% 92% 92% 90%  Weight:      Height:        Intake/Output Summary (Last 24 hours) at 08/26/2022 1041 Last data filed at 08/26/2022 0800 Gross per 24 hour  Intake 900.72 ml  Output 2025 ml  Net -1124.28 ml   Filed  Weights   08/23/22 0500 08/24/22 0401 08/25/22 0430  Weight: 86.1 kg 86.1 kg 86.1 kg    Examination:  Awake Alert, Oriented X 3, No new F.N deficits, Normal affect Symmetrical Chest wall movement, Good air movement bilaterally, CTAB RRR,No Gallops,Rubs or new Murmurs, No Parasternal Heave +ve B.Sounds, Abd Soft, No tenderness, No rebound - guarding or rigidity. Right AKA, left lower extremity CHRONIC skin changes, edema and digit amputations    Data Reviewed: I have personally reviewed following labs and imaging studies  CBC: Recent Labs  Lab 08/22/22 0907 08/22/22 1314 08/22/22 1438 08/23/22 0724 08/24/22 1013 08/25/22 0309 08/26/22 0716  WBC 15.5*  --  13.1* 11.9* 10.9* 9.1 7.5  NEUTROABS 13.9*  --   --   --   --   --   --   HGB 10.0*   < > 9.5* 9.1* 8.4* 7.6* 9.6*  HCT 32.9*   < > 30.0* 29.0* 26.9* 25.2* 31.3*  MCV 85.9  --  84.7 82.9 84.6 85.4 85.1  PLT 219  --  201 176 154 156 165   < > = values in this interval not displayed.    Basic Metabolic Panel: Recent Labs  Lab 08/22/22 0907 08/22/22 1314 08/22/22 1438 08/23/22 0724 08/24/22 1013 08/25/22 0309 08/26/22 0716  NA 137 137  --  134* 135 136 136  K 4.7 4.2  --  4.4 4.1 4.4 4.0  CL 102  --   --  103 103 102 103  CO2 18*  --   --  20* 21* 22 21*  GLUCOSE 187*  --   --  168* 161* 236* 186*  BUN 57*  --   --  60* 68* 70* 62*  CREATININE 3.87*  --  3.66* 3.50* 3.52* 3.47* 2.72*  CALCIUM 8.5*  --   --  8.1* 8.0* 8.4* 8.8*  MG  --   --   --  1.5*  --   --   --     GFR: Estimated Creatinine Clearance: 29.1 mL/min (A) (by C-G formula based on SCr of 2.72 mg/dL (H)).  Liver Function Tests: Recent Labs  Lab 08/22/22 0907  AST 48*  ALT 24  ALKPHOS 48  BILITOT 0.7  PROT 6.8  ALBUMIN 2.8*    CBG: Recent Labs  Lab 08/24/22 1132 08/25/22 0752 08/25/22 1201 08/25/22 1606 08/26/22 0815  GLUCAP 170* 149* 206* 381* 149*     Recent Results (from the past 240 hour(s))  Resp panel by RT-PCR (RSV,  Flu A&B, Covid) Anterior Nasal Swab     Status: Abnormal   Collection Time: 08/22/22  8:56 AM   Specimen: Anterior Nasal Swab  Result Value Ref Range Status   SARS Coronavirus 2 by RT PCR NEGATIVE NEGATIVE Final   Influenza A by PCR POSITIVE (A) NEGATIVE Final   Influenza B by PCR NEGATIVE NEGATIVE Final    Comment: (NOTE) The Xpert Xpress SARS-CoV-2/FLU/RSV plus assay is intended as an aid in the diagnosis of influenza from Nasopharyngeal swab specimens and should not be used as a sole basis for treatment. Nasal washings and aspirates are unacceptable for Xpert Xpress SARS-CoV-2/FLU/RSV testing.  Fact Sheet  for Patients: EntrepreneurPulse.com.au  Fact Sheet for Healthcare Providers: IncredibleEmployment.be  This test is not yet approved or cleared by the [place] FDA and has been authorized for detection and/or diagnosis of SARS-CoV-2 by FDA under an Emergency Use Authorization (EUA). This EUA will remain in effect (meaning this test can be used) for the duration of the COVID-19 declaration under Section 564(b)(1) of the Act, 21 U.S.C. section 360bbb-3(b)(1), unless the authorization is terminated or revoked.     Resp Syncytial Virus by PCR NEGATIVE NEGATIVE Final    Comment: (NOTE) Fact Sheet for Patients: EntrepreneurPulse.com.au  Fact Sheet for Healthcare Providers: IncredibleEmployment.be  This test is not yet approved or cleared by the [place] FDA and has been authorized for detection and/or diagnosis of SARS-CoV-2 by FDA under an Emergency Use Authorization (EUA). This EUA will remain in effect (meaning this test can be used) for the duration of the COVID-19 declaration under Section 564(b)(1) of the Act, 21 U.S.C. section 360bbb-3(b)(1), unless the authorization is terminated or revoked.  Performed at [facility], [place] [address], [place], [place] [address]   Blood Culture  (routine x 2)     Status: Abnormal   Collection Time: 08/22/22  8:56 AM   Specimen: BLOOD  Result Value Ref Range Status   Specimen Description BLOOD SITE NOT SPECIFIED  Final   Special Requests   Final    BOTTLES DRAWN AEROBIC AND ANAEROBIC Blood Culture results may not be optimal due to an inadequate volume of blood received in culture bottles   Culture  Setup Time   Final    GRAM POSITIVE COCCI IN CHAINS IN PAIRS AEROBIC BOTTLE ONLY CRITICAL RESULT CALLED TO, READ BACK BY AND VERIFIED WITH: PHARMD J. LEDFORD 08/23/22 @ 0159 BY AB Performed at [facility], [place] [address], [place], [place] [address]    Culture STREPTOCOCCUS GROUP G (A)  Final   Report Status 08/25/2022 FINAL  Final   Organism ID, Bacteria STREPTOCOCCUS GROUP G  Final      Susceptibility   Streptococcus group g - MIC*    CLINDAMYCIN >=1 RESISTANT Resistant     AMPICILLIN <=0.25 SENSITIVE Sensitive     ERYTHROMYCIN >=8 RESISTANT Resistant     VANCOMYCIN 0.5 SENSITIVE Sensitive     CEFTRIAXONE <=0.12 SENSITIVE Sensitive     LEVOFLOXACIN 0.5 SENSITIVE Sensitive     PENICILLIN <=0.06 SENSITIVE Sensitive     * STREPTOCOCCUS GROUP G  Blood Culture ID Panel (Reflexed)     Status: Abnormal   Collection Time: 08/22/22  8:56 AM  Result Value Ref Range Status   Enterococcus faecalis NOT DETECTED NOT DETECTED Final   Enterococcus Faecium NOT DETECTED NOT DETECTED Final   Listeria monocytogenes NOT DETECTED NOT DETECTED Final   Staphylococcus species NOT DETECTED NOT DETECTED Final   Staphylococcus aureus (BCID) NOT DETECTED NOT DETECTED Final   Staphylococcus epidermidis NOT DETECTED NOT DETECTED Final   Staphylococcus lugdunensis NOT DETECTED NOT DETECTED Final   Streptococcus species DETECTED (A) NOT DETECTED Final    Comment: Not Enterococcus species, Streptococcus agalactiae, Streptococcus pyogenes, or Streptococcus pneumoniae. CRITICAL RESULT CALLED TO, READ BACK BY AND VERIFIED WITH: PHARMD J. LEDFORD 08/23/22 @  0159 BY AB    Streptococcus agalactiae NOT DETECTED NOT DETECTED Final   Streptococcus pneumoniae NOT DETECTED NOT DETECTED Final   Streptococcus pyogenes NOT DETECTED NOT DETECTED Final   A.calcoaceticus-baumannii NOT DETECTED NOT DETECTED Final   Bacteroides fragilis NOT DETECTED NOT DETECTED Final  Enterobacterales NOT DETECTED NOT DETECTED Final   Enterobacter cloacae complex NOT DETECTED NOT DETECTED Final   Escherichia coli NOT DETECTED NOT DETECTED Final   Klebsiella aerogenes NOT DETECTED NOT DETECTED Final   Klebsiella oxytoca NOT DETECTED NOT DETECTED Final   Klebsiella pneumoniae NOT DETECTED NOT DETECTED Final   Proteus species NOT DETECTED NOT DETECTED Final   Salmonella species NOT DETECTED NOT DETECTED Final   Serratia marcescens NOT DETECTED NOT DETECTED Final   Haemophilus influenzae NOT DETECTED NOT DETECTED Final   Neisseria meningitidis NOT DETECTED NOT DETECTED Final   Pseudomonas aeruginosa NOT DETECTED NOT DETECTED Final   Stenotrophomonas maltophilia NOT DETECTED NOT DETECTED Final   Candida albicans NOT DETECTED NOT DETECTED Final   Candida auris NOT DETECTED NOT DETECTED Final   Candida glabrata NOT DETECTED NOT DETECTED Final   Candida krusei NOT DETECTED NOT DETECTED Final   Candida parapsilosis NOT DETECTED NOT DETECTED Final   Candida tropicalis NOT DETECTED NOT DETECTED Final   Cryptococcus neoformans/gattii NOT DETECTED NOT DETECTED Final    Comment: Performed at [facility], [place] [address], [place], [place] [address]  Remove and replace urinary cath (placed > 5 days) then obtain urine culture from new indwelling urinary catheter.     Status: None   Collection Time: 08/22/22  8:57 AM   Specimen: Urine, Catheterized  Result Value Ref Range Status   Specimen Description URINE, CATHETERIZED  Final   Special Requests NONE  Final   Culture   Final    NO GROWTH Performed at [facility], [address] [place], [place] [address]    Report  Status 08/23/2022 FINAL  Final  Blood Culture (routine x 2)     Status: None (Preliminary result)   Collection Time: 08/22/22 11:35 AM   Specimen: BLOOD  Result Value Ref Range Status   Specimen Description BLOOD SITE NOT SPECIFIED  Final   Special Requests   Final    BOTTLES DRAWN AEROBIC AND ANAEROBIC Blood Culture adequate volume   Culture   Final    NO GROWTH 4 DAYS Performed at [facility], [place] [address], [place], [place] [address]    Report Status PENDING  Incomplete  Culture, blood (single) w Reflex to ID Panel     Status: None (Preliminary result)   Collection Time: 08/24/22  6:44 PM   Specimen: BLOOD RIGHT HAND  Result Value Ref Range Status   Specimen Description BLOOD RIGHT HAND  Final   Special Requests   Final    BOTTLES DRAWN AEROBIC AND ANAEROBIC Blood Culture results may not be optimal due to an excessive volume of blood received in culture bottles   Culture   Final    NO GROWTH 2 DAYS Performed at [facility], [place] [address], [place],  [address]    Report Status PENDING  Incomplete         Radiology Studies: No results found.      Scheduled Meds:  sodium chloride   Intravenous Once   budesonide (PULMICORT) nebulizer solution  0.25 mg Nebulization BID   Chlorhexidine Gluconate Cloth  6 each Topical Daily   fluticasone  1 spray Each Nare Daily   gabapentin  100 mg Oral TID   heparin  5,000 Units Subcutaneous Q8H   insulin aspart  0-5 Units Subcutaneous QHS   insulin aspart  0-9 Units Subcutaneous TID WC   insulin glargine-yfgn  20 Units Subcutaneous Daily   ipratropium-albuterol  3 mL Nebulization BID   mirabegron ER  50 mg Oral Daily   multivitamin with minerals  1 tablet Oral Daily   nicotine  21 mg Transdermal Daily   oxybutynin  10 mg Oral Daily   pantoprazole  40 mg Oral Daily   polyethylene glycol  17 g Oral Daily   predniSONE  40 mg Oral Q breakfast   rOPINIRole  2 mg Oral BID   rosuvastatin  10 mg Oral QHS    senna-docusate  1 tablet Oral QHS   tamsulosin  0.8 mg Oral Daily   umeclidinium bromide  1 puff Inhalation Daily   Continuous Infusions:  cefTRIAXone (ROCEPHIN)  IV Stopped (08/25/22 1308)     LOS: 4 days        Phillips Climes, MD [facility]   To contact the attending provider between 7A-7P or the covering provider during after hours 7P-7A, please log into the web site [email or web address] and access using universal [place] password for that web site. If you do not have the password, please call the hospital operator.  08/26/2022, 10:41 AM

## 2022-08-26 NOTE — Plan of Care (Signed)
  Problem: Education: Goal: Ability to describe self-care measures that may prevent or decrease complications (Diabetes Survival Skills Education) will improve Outcome: Not Progressing Goal: Individualized Educational Video(s) Outcome: Not Progressing   Problem: Coping: Goal: Ability to adjust to condition or change in health will improve Outcome: Not Progressing   Problem: Fluid Volume: Goal: Ability to maintain a balanced intake and output will improve Outcome: Not Progressing   Problem: Health Behavior/Discharge Planning: Goal: Ability to identify and utilize available resources and services will improve Outcome: Not Progressing Goal: Ability to manage health-related needs will improve Outcome: Not Progressing   Problem: Metabolic: Goal: Ability to maintain appropriate glucose levels will improve Outcome: Not Progressing   Problem: Nutritional: Goal: Maintenance of adequate nutrition will improve Outcome: Not Progressing Goal: Progress toward achieving an optimal weight will improve Outcome: Not Progressing   Problem: Skin Integrity: Goal: Risk for impaired skin integrity will decrease Outcome: Not Progressing   Problem: Tissue Perfusion: Goal: Adequacy of tissue perfusion will improve Outcome: Not Progressing   Problem: Education: Goal: Knowledge of General Education information will improve Description: Including pain rating scale, medication(s)/side effects and non-pharmacologic comfort measures Outcome: Not Progressing   Problem: Health Behavior/Discharge Planning: Goal: Ability to manage health-related needs will improve Outcome: Not Progressing   Problem: Clinical Measurements: Goal: Ability to maintain clinical measurements within normal limits will improve Outcome: Not Progressing Goal: Will remain free from infection Outcome: Not Progressing Goal: Diagnostic test results will improve Outcome: Not Progressing Goal: Respiratory complications will  improve Outcome: Not Progressing Goal: Cardiovascular complication will be avoided Outcome: Not Progressing   Problem: Activity: Goal: Risk for activity intolerance will decrease Outcome: Not Progressing   Problem: Nutrition: Goal: Adequate nutrition will be maintained Outcome: Not Progressing   Problem: Coping: Goal: Level of anxiety will decrease Outcome: Not Progressing   Problem: Elimination: Goal: Will not experience complications related to bowel motility Outcome: Not Progressing Goal: Will not experience complications related to urinary retention Outcome: Not Progressing   Problem: Pain Managment: Goal: General experience of comfort will improve Outcome: Not Progressing   Problem: Safety: Goal: Ability to remain free from injury will improve Outcome: Not Progressing   Problem: Skin Integrity: Goal: Risk for impaired skin integrity will decrease Outcome: Not Progressing   

## 2022-08-27 LAB — BASIC METABOLIC PANEL
Anion gap: 14 (ref 5–15)
BUN: 56 mg/dL — ABNORMAL HIGH (ref 8–23)
CO2: 17 mmol/L — ABNORMAL LOW (ref 22–32)
Calcium: 8.9 mg/dL (ref 8.9–10.3)
Chloride: 103 mmol/L (ref 98–111)
Creatinine, Ser: 2.21 mg/dL — ABNORMAL HIGH (ref 0.61–1.24)
GFR, Estimated: 33 mL/min — ABNORMAL LOW (ref >60)
Glucose, Bld: 212 mg/dL — ABNORMAL HIGH (ref 70–99)
Potassium: 3.8 mmol/L (ref 3.5–5.1)
Sodium: 134 mmol/L — ABNORMAL LOW (ref 135–145)

## 2022-08-27 LAB — LEGIONELLA PNEUMOPHILA SEROGP 1 UR AG: L. pneumophila Serogp 1 Ur Ag: NEGATIVE

## 2022-08-27 LAB — GLUCOSE, CAPILLARY
Glucose-Capillary: 194 mg/dL — ABNORMAL HIGH (ref 70–99)
Glucose-Capillary: 174 mg/dL — ABNORMAL HIGH (ref 70–99)
Glucose-Capillary: 344 mg/dL — ABNORMAL HIGH (ref 70–99)
Glucose-Capillary: 239 mg/dL — ABNORMAL HIGH (ref 70–99)

## 2022-08-27 LAB — CULTURE, BLOOD (ROUTINE X 2)
Culture: NO GROWTH
Special Requests: ADEQUATE

## 2022-08-27 MED ORDER — OXYCODONE HCL 20 MG PO TABS: 20.0000 mg | ORAL_TABLET | ORAL | 0 refills | Status: DC

## 2022-08-27 MED ORDER — HYDROMORPHONE HCL 1 MG/ML IJ SOLN
1.0000 mg | Freq: Once | INTRAMUSCULAR | Status: AC | PRN
  Administered 2022-08-27: 1 mg via INTRAVENOUS
  Filled 2022-08-27: qty 1

## 2022-08-27 MED ORDER — OXYCODONE HCL 5 MG PO TABS: 5.0000 mg | ORAL_TABLET | Freq: Three times a day (TID) | ORAL | 0 refills | Status: DC | PRN

## 2022-08-27 MED ORDER — TIZANIDINE HCL 2 MG PO TABS: 2.0000 mg | ORAL_TABLET | Freq: Once | ORAL | Status: DC

## 2022-08-27 MED ORDER — LANTUS SOLOSTAR 100 UNIT/ML ~~LOC~~ SOPN: 22.0000 [IU] | PEN_INJECTOR | Freq: Every day | SUBCUTANEOUS | 11 refills | Status: DC

## 2022-08-27 MED ORDER — AMOXICILLIN-POT CLAVULANATE 875-125 MG PO TABS: 1.0000 | ORAL_TABLET | Freq: Two times a day (BID) | ORAL | 0 refills | Status: DC

## 2022-08-27 MED ORDER — HYDROXYZINE HCL 10 MG PO TABS
10.0000 mg | ORAL_TABLET | Freq: Three times a day (TID) | ORAL | Status: DC | PRN
  Administered 2022-08-27: 10 mg via ORAL
  Filled 2022-08-27: qty 1

## 2022-08-27 NOTE — Discharge Summary (Signed)
Physician Discharge Summary  Gregory [name] [name] DOB: 26-Dec-1960 DOA: 08/22/2022  PCP: [name], MD  Admit date: 08/22/2022 Discharge date: 08/27/2022 30 Day Unplanned Readmission Risk Score    Flowsheet Row ED to Hosp-Admission (Current) from 08/22/2022 in [facility]  30 Day Unplanned Readmission Risk Score (%) 43.4 Filed at 08/27/2022 0801       This score is the patient's risk of an unplanned readmission within 30 days of being discharged (0 -100%). The score is based on dignosis, age, lab data, medications, orders, and past utilization.   Low:  0-14.9   Medium: 15-21.9   High: 22-29.9   Extreme: 30 and above          Admitted From: SNF Disposition: SNF  Recommendations for Outpatient Follow-up:  Follow up with PCP in 1-2 weeks Please obtain BMP/CBC in one week Please follow up with your PCP on the following pending results: Unresulted Labs (From admission, onward)     Start     Ordered   08/27/22 1020  MRSA Next Gen by PCR, Nasal  Once,   R        08/27/22 1019   08/25/22 0500  CBC  Daily,   R     Question:  Specimen collection method  Answer:  Lab=Lab collect   08/24/22 1229   08/25/22 [id number]  Basic metabolic panel  Daily,   R     Question:  Specimen collection method  Answer:  Lab=Lab collect   08/24/22 1229   08/22/22 1421  Legionella Pneumophila Serogp 1 Ur Ag  Once,   R        08/22/22 [place]: None Equipment/Devices: None  Discharge Condition: Stable CODE STATUS: Full code Diet recommendation: Cardiac  Subjective: Seen and examined.  He has no complaints.  He is eager to go back to SNF.  Brief/Interim Summary: Gregory [name] is a 62 y.o. 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  Chest x-ray showed multifocal pneumonia.  Blood work showed WBC 15, hemoglobin 10.0, 18, creatinine is 3.87 compared to baseline 1.8-2.0.  UA showed no signs of UTI.  Admitted to hospital service with multiple medical problems as detailed below.   Severe sepsis due to left lower extremity cellulitis and multifocal pneumonia and Streptococcus group G bacteremia and left lower extremity cellulitis. -Evidenced by tachycardia, elevated lactate, fever, likely infectious source is left leg cellulitis plus minus multifocal pneumonia. -started broad-spectrum antibiotics vancomycin and cefepime plus azithromycin to cover left leg cellulitis and multifocal pneumonia.  DVT ruled out.  Wound care consulted. -No evidence of acute osteomyelitis on MRI - bacteremia most likely related to his significant cellulitis, currently susceptibilities available, antibiotics were narrowed to Rocephin.  He was also given azithromycin for his pneumonia.  Blood culture grew Streptococcus group G, sensitive to ampicillin.  Based on that, we are discharging him on 2 weeks of Augmentin.  Cellulitis has improved significantly.   Acute on chronic hypoxic respiratory failure Acute COPD exacerbation -Multifocal pneumonia  -influenza A pneumonia -Treated course of Tamiflu here.  Was on antibiotics as mentioned above.  Currently on 2 L  of oxygen which is his baseline.  Discharging on home medications.  AKI on CKD stage IIIb -Baseline creatinine 1.7-2.2, presented with 3.87.  Likely prerenal secondary to sepsis, IV resuscitation as above -Foley catheter inserted in ED, it was discontinued, no evidence of retention. -Creatinine improved today to 2.2.   IDDM with hyperglycemia -Apparently patient was on 2 L of Lantus prior to admission.  He has been on 20 units here and still hyperglycemic so based on that, he is being discharged on 22 units.   Chronic HFpEF -Resume  home Lasix.   Cigarette smoke -Cessation education performed at bedside, -Nicotine patch for   GERD, chronic pain syndrome -Stable, continue with home(SNF) regimen   Anemia due to  chronic kidney disease -Hemoglobin is stable   Discharge plan was discussed with patient and/or family member and they verbalized understanding and agreed with it.  Discharge Diagnoses:  Principal Problem:   Respiratory failure ([place]) Active Problems:   Sepsis due to cellulitis ([place])   Pressure injury of skin   Influenza A   Sepsis ([place])    Discharge Instructions   Allergies as of 08/27/2022       Reactions   Latex Other (See Comments)   "ALLERGIC," per [facility]        Medication List     TAKE these medications    Afrin 12 Hour 0.05 % nasal spray Generic drug: oxymetazoline Place 1 spray into both nostrils every 12 (twelve) hours as needed for congestion.   amoxicillin-clavulanate 875-125 MG tablet Commonly known as: AUGMENTIN Take 1 tablet by mouth 2 (two) times daily for 14 days.   Biofreeze 4 % Gel Generic drug: Menthol (Topical Analgesic) Apply 1 Application topically See admin instructions. Apply to right leg/stump every 8 hours as needed for pain   cetirizine 10 MG tablet Commonly known as: ZYRTEC Take 10 mg by mouth daily.   cyanocobalamin 1000 MCG tablet Take 1,000 mcg by mouth daily.   docusate sodium 100 MG capsule Commonly known as: COLACE Take 1 capsule (100 mg total) by mouth 2 (two) times daily.   eszopiclone 2 MG Tabs tablet Commonly known as: LUNESTA Take 2 mg by mouth at bedtime as needed for sleep (immediately before bedtime and HOLD FOR SEDATION).   fenofibrate 48 MG tablet Commonly known as: TRICOR Take 48 mg by mouth daily.   ferrous sulfate 325 (65 FE) MG tablet Take 1 tablet (325 mg total) by mouth daily with breakfast.   fluticasone 50 MCG/ACT nasal spray Commonly known as: FLONASE Place 1 spray into both nostrils in the morning.   gabapentin  300 MG capsule Commonly known as: NEURONTIN Take 300 mg by mouth 3 (three) times daily.   Glucagon Emergency 1 MG Kit Inject 1 mg into the vein as needed (for hypolycemia).   guaiFENesin 200 MG tablet Take 200 mg by mouth every 6 (six) hours as needed (congestion).   hydrOXYzine 25 MG tablet Commonly known as: ATARAX Take 25 mg by mouth every 6 (six) hours as needed for itching or anxiety.   insulin lispro 100 UNIT/ML KwikPen Commonly known as: HUMALOG Inject 0-10 Units into the skin See admin instructions. Inject 0-10 units into the skin three times a day and at bedtime, PER SLIDING SCALE:  BGL 151-200 = 2 units 201-250 = 4 units 251-300 = 6 units 301-350 =  8 units 351-400 = 10 units What changed:  how much to take additional instructions   ipratropium-albuterol 0.5-2.5 (3) MG/3ML Soln Commonly known  as: DUONEB Take 3 mLs by nebulization every 6 (six) hours as needed (for shortness of breath).   lactulose 10 GM/15ML solution Commonly known as: CHRONULAC Take 15 mLs (10 g total) by mouth 3 (three) times daily as needed for moderate constipation. What changed: when to take this   Lantus SoloStar 100 UNIT/ML Solostar Pen Generic drug: insulin glargine Inject 22 Units into the skin daily. What changed: how much to take   levalbuterol 0.63 MG/3ML nebulizer solution Commonly known as: XOPENEX Take 3 mLs (0.63 mg total) by nebulization every 6 (six) hours as needed for wheezing or shortness of breath.   lidocaine 4 % Apply 1 patch topically See admin instructions. Apply 1 patch to both the right shoulder and the right stump at 9 AM daily- remove at 9 PM   Lubricant Eye Drops 0.4-0.3 % Soln Generic drug: Polyethyl Glycol-Propyl Glycol Place 2 drops into both eyes every 4 (four) hours as needed (dryness/itching).   magnesium oxide 400 MG tablet Commonly known as: MAG-OX Take 400 mg by mouth 2 (two) times daily.   melatonin 3 MG Tabs tablet Take 3 mg by mouth at  bedtime.   methenamine 1 g tablet Commonly known as: MANDELAMINE Take 1 g by mouth daily.   metoprolol tartrate 25 MG tablet Commonly known as: LOPRESSOR Take 0.5 tablets (12.5 mg total) by mouth 2 (two) times daily.   mirabegron ER 50 MG Tb24 tablet Commonly known as: MYRBETRIQ Take 50 mg by mouth daily.   multivitamin with minerals tablet Take 1 tablet by mouth daily.   PreserVision AREDS 2 Caps Take 1 capsule by mouth in the morning and at bedtime.   Narcan 4 MG/0.1ML Liqd nasal spray kit Generic drug: naloxone Place 1 spray into the nose as needed (poorly responsive or turning blue).   nystatin cream Commonly known as: MYCOSTATIN Apply 1 Application topically See admin instructions. Apply to wound site every day shift and as needed, for wound care   omeprazole 20 MG capsule Commonly known as: PRILOSEC Take 20 mg by mouth daily.   ondansetron 4 MG tablet Commonly known as: ZOFRAN Take 4 mg by mouth every 8 (eight) hours as needed for nausea.   oxybutynin 10 MG 24 hr tablet Commonly known as: DITROPAN-XL Take 10 mg by mouth daily.   oxyCODONE 5 MG immediate release tablet Commonly known as: Oxy IR/ROXICODONE Take 1 tablet (5 mg total) by mouth every 8 (eight) hours as needed for severe pain (pain). What changed:  reasons to take this Another medication with the same name was removed. Continue taking this medication, and follow the directions you see here.   Oxycodone HCl 20 MG Tabs Take 1 tablet (20 mg total) by mouth See admin instructions for 10 days. Take 20 mg by mouth three times a day and hold for sedation What changed:  Another medication with the same name was changed. Make sure you understand how and when to take each. Another medication with the same name was removed. Continue taking this medication, and follow the directions you see here.   OXYGEN Inhale 2 L/min into the lungs as needed (for shortness of breath- to maintain sats >90%).    phenazopyridine 100 MG tablet Commonly known as: PYRIDIUM Take 100 mg by mouth every 8 (eight) hours as needed for pain.   polyethylene glycol 17 g packet Commonly known as: MIRALAX / GLYCOLAX Take 17 g by mouth daily as needed for mild constipation.   pregabalin 150 MG capsule Commonly  known as: LYRICA Take 1 capsule (150 mg total) by mouth 2 (two) times daily.   Pro-Stat Liqd Take 30 mLs by mouth in the morning.   promethazine 12.5 MG tablet Commonly known as: PHENERGAN Take 12.5 mg by mouth every 6 (six) hours as needed for nausea or vomiting.   rOPINIRole 2 MG tablet Commonly known as: REQUIP Take 2 mg by mouth 2 (two) times daily.   rosuvastatin 10 MG tablet Commonly known as: CRESTOR Take 10 mg by mouth at bedtime.   senna 8.6 MG Tabs tablet Commonly known as: SENOKOT Take 1 tablet by mouth at bedtime.   Spiriva Respimat 1.25 MCG/ACT Aers Generic drug: Tiotropium Bromide Monohydrate Inhale 1 each into the lungs in the morning.   tamsulosin 0.4 MG Caps capsule Commonly known as: FLOMAX Take 0.8 mg by mouth daily.   tiZANidine 4 MG tablet Commonly known as: ZANAFLEX Take 4 mg by mouth every 6 (six) hours as needed for muscle spasms.   Trulicity [id number] Sopn Generic drug: Dulaglutide Inject 0.75 mg into the skin every Wednesday.   Uribel 118 MG Caps Take 1 capsule by mouth every 8 (eight) hours as needed (bladder pain).        Follow-up Information     [name], MD Follow up in 1 week(s).   Specialty: Internal Medicine Contact information: [place] [address] [phone]                Allergies  Allergen Reactions   Latex Other (See Comments)    "ALLERGIC," per [facility]    Consultations: None   Procedures/Studies: VAS [place] LOWER EXTREMITY VENOUS (DVT)  Result Date: 08/23/2022  Lower Venous DVT Study Patient Name:  Gregory [name]  Date of Exam:   08/23/2022 Medical Rec #: [id number]     Accession #:     [id number] Date of Birth: [birth year]    Patient Gender: M Patient Age:   62 years Exam [facility]  [facility] Procedure:      VAS [place] LOWER EXTREMITY VENOUS (DVT) Referring Phys: Wynetta Fines --------------------------------------------------------------------------------  Indications: Pain, Swelling, and ulceration.  Risk Factors: Right Above knee amputation 11/19/18. Multiple left toe amputations Limitations: Poor ultrasound/tissue interface and Woody edema. Comparison Study: Prior negative left LEV done 07/05/21 Performing Technologist: Sharion Dove RVS  Examination Guidelines: A complete evaluation includes B-mode imaging, spectral Doppler, color Doppler, and power Doppler as needed of all accessible portions of each vessel. Bilateral testing is considered an integral part of a complete examination. Limited examinations for reoccurring indications may be performed as noted. The reflux portion of the exam is performed with the patient in reverse Trendelenburg.  +-------+---------------+---------+-----------+----------+--------------+ RIGHT  CompressibilityPhasicitySpontaneityPropertiesThrombus Aging +-------+---------------+---------+-----------+----------+--------------+ CFV    Full           Yes      Yes                                 +-------+---------------+---------+-----------+----------+--------------+ FV ProxFull                                                        +-------+---------------+---------+-----------+----------+--------------+ PFV    Full                                                        +-------+---------------+---------+-----------+----------+--------------+   +---------+---------------+---------+-----------+----------+-------------------+  LEFT     CompressibilityPhasicitySpontaneityPropertiesThrombus Aging      +---------+---------------+---------+-----------+----------+-------------------+ CFV      Full           Yes      Yes                                       +---------+---------------+---------+-----------+----------+-------------------+ SFJ      Full                                                             +---------+---------------+---------+-----------+----------+-------------------+ FV Prox  Full                                                             +---------+---------------+---------+-----------+----------+-------------------+ FV Mid   Full                                                             +---------+---------------+---------+-----------+----------+-------------------+ FV DistalFull                                                             +---------+---------------+---------+-----------+----------+-------------------+ PFV      Full                                                             +---------+---------------+---------+-----------+----------+-------------------+ POP      Full           Yes      Yes                                      +---------+---------------+---------+-----------+----------+-------------------+ PTV                                                   Not well visualized +---------+---------------+---------+-----------+----------+-------------------+ PERO                                                  Not well visualized +---------+---------------+---------+-----------+----------+-------------------+     Summary: RIGHT: - No evidence of common femoral vein obstruction.  LEFT: - There is no evidence of deep  vein thrombosis in the lower extremity. However, portions of this examination were limited- see technologist comments above.  - Attempted left ABI/duplex of left PTA, ATA. However, skin/tissue properties prohibit evaluation of the vessels of the calf/ankle - Ultrasound characteristics of enlarged, reactive lymph nodes noted in the groin.  *See table(s) above for measurements and observations. Electronically signed by Orlie Pollen on  08/23/2022 at 4:21:00 PM.    Final    MR FOOT LEFT WO CONTRAST  Result Date: 08/23/2022 CLINICAL DATA:  Osteomyelitis, foot EXAM: MRI OF THE LEFT FOOT WITHOUT CONTRAST TECHNIQUE: Multiplanar, multisequence MR imaging of the left forefoot was performed. No intravenous contrast was administered. COMPARISON:  Left radiograph 08/22/2022 FINDINGS: Bones/Joint/Cartilage Prior partial first and second ray amputations. Prior partial third toe amputation. There is focal blunting of the fourth toe distal phalangeal tuft without marrow edema, likely chronic. There is chronic erosive changes at the third metatarsal head and arthritis of the third MTP joint. There is no significant associated marrow edema and there is preserved T1 marrow signal. Ligaments Intact Lisfranc ligament. Muscles and Tendons Chronic denervation changes in the foot. Soft tissues Severe diffuse skin thickening and soft tissue swelling of the foot. There is no organized fluid collection on noncontrast MRI. IMPRESSION: Severe diffuse skin thickening and soft tissue swelling of the foot, as can be seen cellulitis. No evidence soft tissue abscess on noncontrast MRI. Chronic erosive change at the third metatarsal head and arthritis of the third MTP joint. Focal blunting of the fourth toe distal phalangeal tuft without marrow edema, also likely chronic. No evidence of acute osteomyelitis. Electronically Signed   By: Maurine Simmering M.D.   On: 08/23/2022 11:43   DG Foot 2 Views Left  Result Date: 08/22/2022 CLINICAL DATA:  Cellulitis EXAM: LEFT FOOT - 2 VIEW COMPARISON:  Radiograph 06/03/2018 FINDINGS: Severe soft tissue swelling of the left foot and ankle. Prior partial first and second ray amputations. Prior partial third toe amputation. Osseous lucency in the third metatarsal head and articular surface irregularity of the third MTP joint. New blunting and cortical irregularity of the fourth toe distal phalangeal tuft. Focal lucency in the posterior calcaneus.  IMPRESSION: Severe soft tissue swelling of the left foot and ankle. Osseous lucency in the third metatarsal head and articular surface irregularity of the third MTP joint, and new blunting and cortical irregularity of the fourth toe distal phalangeal tuft, suspicious for osteomyelitis. Focal osseous lucency in the posterior calcaneus, correlate with presence of adjacent ulcer in the heel. Electronically Signed   By: Maurine Simmering M.D.   On: 08/22/2022 15:54   DG Tibia/Fibula Left  Result Date: 08/22/2022 CLINICAL DATA:  Cellulitis EXAM: LEFT TIBIA AND FIBULA - 2 VIEW COMPARISON:  None Available. FINDINGS: No acute osseous abnormality involving the left tibia or fibula. Alignment is normal. There is no bone erosion or frank bony destruction. There is severe soft tissue swelling of the left lower leg. IMPRESSION: Severe soft tissue swelling of the left lower leg. No acute osseous abnormality involving the left tibia or fibula. Electronically Signed   By: Maurine Simmering M.D.   On: 08/22/2022 15:48   DG Chest Port 1 View  Result Date: 08/22/2022 CLINICAL DATA:  Questionable sepsis EXAM: PORTABLE CHEST 1 VIEW COMPARISON:  Radiograph 05/01/2022 FINDINGS: Stable cardiac silhouette. There is patchy bilateral airspace disease more dense on the LEFT. No pneumothorax. Anterior posterior cervical fusion IMPRESSION: Bilateral airspace disease representing multifocal pneumonia versus pulmonary edema. Electronically Signed   By: Nicole Kindred  Leonia Reeves M.D.   On: 08/22/2022 10:00     Discharge Exam: Vitals:   08/27/22 0520 08/27/22 0754  BP: (!) 150/88 (!) 149/77  Pulse: 88 89  Resp: 18 17  Temp: 98.2 F (36.8 C) 98.1 F (36.7 C)  SpO2: 95% 96%   Vitals:   08/27/22 0100 08/27/22 0500 08/27/22 0520 08/27/22 0754  BP: (!) 159/73  (!) 150/88 (!) 149/77  Pulse: 77  88 89  Resp: '16  18 17  '$ Temp: (!) 97.3 F (36.3 C)  98.2 F (36.8 C) 98.1 F (36.7 C)  TempSrc: Oral  Axillary Oral  SpO2: 93%  95% 96%  Weight:  86 kg     Height:        General: Pt is alert, awake, not in acute distress Cardiovascular: RRR, S1/S2 +, no rubs, no gallops Respiratory: CTA bilaterally, no wheezing, no rhonchi Abdominal: Soft, NT, ND, bowel sounds + Extremities: no edema, no cyanosis, right AKA, lymphedema in the left lower extremity with very minimal scattered erythema.    The results of significant diagnostics from this hospitalization (including imaging, microbiology, ancillary and laboratory) are listed below for reference.     Microbiology: Recent Results (from the past 240 hour(s))  Resp panel by RT-PCR (RSV, Flu A&B, Covid) Anterior Nasal Swab     Status: Abnormal   Collection Time: 08/22/22  8:56 AM   Specimen: Anterior Nasal Swab  Result Value Ref Range Status   SARS Coronavirus 2 by RT PCR NEGATIVE NEGATIVE Final   Influenza A by PCR POSITIVE (A) NEGATIVE Final   Influenza B by PCR NEGATIVE NEGATIVE Final    Comment: (NOTE) The Xpert Xpress SARS-CoV-2/FLU/RSV plus assay is intended as an aid in the diagnosis of influenza from Nasopharyngeal swab specimens and should not be used as a sole basis for treatment. Nasal washings and aspirates are unacceptable for Xpert Xpress SARS-CoV-2/FLU/RSV testing.  Fact Sheet for Patients: EntrepreneurPulse.com.au  Fact Sheet for Healthcare Providers: IncredibleEmployment.be  This test is not yet approved or cleared by the [place] FDA and has been authorized for detection and/or diagnosis of SARS-CoV-2 by FDA under an Emergency Use Authorization (EUA). This EUA will remain in effect (meaning this test can be used) for the duration of the COVID-19 declaration under Section 564(b)(1) of the Act, 21 U.S.C. section 360bbb-3(b)(1), unless the authorization is terminated or revoked.     Resp Syncytial Virus by PCR NEGATIVE NEGATIVE Final    Comment: (NOTE) Fact Sheet for  Patients: EntrepreneurPulse.com.au  Fact Sheet for Healthcare Providers: IncredibleEmployment.be  This test is not yet approved or cleared by the [place] FDA and has been authorized for detection and/or diagnosis of SARS-CoV-2 by FDA under an Emergency Use Authorization (EUA). This EUA will remain in effect (meaning this test can be used) for the duration of the COVID-19 declaration under Section 564(b)(1) of the Act, 21 U.S.C. section 360bbb-3(b)(1), unless the authorization is terminated or revoked.  Performed at [facility], [place] [address] [place], [place] [address]   Blood Culture (routine x 2)     Status: Abnormal   Collection Time: 08/22/22  8:56 AM   Specimen: BLOOD  Result Value Ref Range Status   Specimen Description BLOOD SITE NOT SPECIFIED  Final   Special Requests   Final    BOTTLES DRAWN AEROBIC AND ANAEROBIC Blood Culture results may not be optimal due to an inadequate volume of blood received in culture bottles   Culture  Setup Time  Final    GRAM POSITIVE COCCI IN CHAINS IN PAIRS AEROBIC BOTTLE ONLY CRITICAL RESULT CALLED TO, READ BACK BY AND VERIFIED WITH: PHARMD J. LEDFORD 08/23/22 @ 0159 BY AB Performed at [facility], [place] [address], [place], [place] [address]    Culture STREPTOCOCCUS GROUP G (A)  Final   Report Status 08/25/2022 FINAL  Final   Organism ID, Bacteria STREPTOCOCCUS GROUP G  Final      Susceptibility   Streptococcus group g - MIC*    CLINDAMYCIN >=1 RESISTANT Resistant     AMPICILLIN <=0.25 SENSITIVE Sensitive     ERYTHROMYCIN >=8 RESISTANT Resistant     VANCOMYCIN 0.5 SENSITIVE Sensitive     CEFTRIAXONE <=0.12 SENSITIVE Sensitive     LEVOFLOXACIN 0.5 SENSITIVE Sensitive     PENICILLIN <=0.06 SENSITIVE Sensitive     * STREPTOCOCCUS GROUP G  Blood Culture ID Panel (Reflexed)     Status: Abnormal   Collection Time: 08/22/22  8:56 AM  Result Value Ref Range Status   Enterococcus  faecalis NOT DETECTED NOT DETECTED Final   Enterococcus Faecium NOT DETECTED NOT DETECTED Final   Listeria monocytogenes NOT DETECTED NOT DETECTED Final   Staphylococcus species NOT DETECTED NOT DETECTED Final   Staphylococcus aureus (BCID) NOT DETECTED NOT DETECTED Final   Staphylococcus epidermidis NOT DETECTED NOT DETECTED Final   Staphylococcus lugdunensis NOT DETECTED NOT DETECTED Final   Streptococcus species DETECTED (A) NOT DETECTED Final    Comment: Not Enterococcus species, Streptococcus agalactiae, Streptococcus pyogenes, or Streptococcus pneumoniae. CRITICAL RESULT CALLED TO, READ BACK BY AND VERIFIED WITH: PHARMD J. LEDFORD 08/23/22 @ 0159 BY AB    Streptococcus agalactiae NOT DETECTED NOT DETECTED Final   Streptococcus pneumoniae NOT DETECTED NOT DETECTED Final   Streptococcus pyogenes NOT DETECTED NOT DETECTED Final   A.calcoaceticus-baumannii NOT DETECTED NOT DETECTED Final   Bacteroides fragilis NOT DETECTED NOT DETECTED Final   Enterobacterales NOT DETECTED NOT DETECTED Final   Enterobacter cloacae complex NOT DETECTED NOT DETECTED Final   Escherichia coli NOT DETECTED NOT DETECTED Final   Klebsiella aerogenes NOT DETECTED NOT DETECTED Final   Klebsiella oxytoca NOT DETECTED NOT DETECTED Final   Klebsiella pneumoniae NOT DETECTED NOT DETECTED Final   Proteus species NOT DETECTED NOT DETECTED Final   Salmonella species NOT DETECTED NOT DETECTED Final   Serratia marcescens NOT DETECTED NOT DETECTED Final   Haemophilus influenzae NOT DETECTED NOT DETECTED Final   Neisseria meningitidis NOT DETECTED NOT DETECTED Final   Pseudomonas aeruginosa NOT DETECTED NOT DETECTED Final   Stenotrophomonas maltophilia NOT DETECTED NOT DETECTED Final   Candida albicans NOT DETECTED NOT DETECTED Final   Candida auris NOT DETECTED NOT DETECTED Final   Candida glabrata NOT DETECTED NOT DETECTED Final   Candida krusei NOT DETECTED NOT DETECTED Final   Candida parapsilosis NOT DETECTED NOT  DETECTED Final   Candida tropicalis NOT DETECTED NOT DETECTED Final   Cryptococcus neoformans/gattii NOT DETECTED NOT DETECTED Final    Comment: Performed at [facility], [address] [place], [place] [address]  Remove and replace urinary cath (placed > 5 days) then obtain urine culture from new indwelling urinary catheter.     Status: None   Collection Time: 08/22/22  8:57 AM   Specimen: Urine, Catheterized  Result Value Ref Range Status   Specimen Description URINE, CATHETERIZED  Final   Special Requests NONE  Final   Culture   Final    NO GROWTH Performed at [facility],  [address], [place], [place] [address]    Report Status 08/23/2022 FINAL  Final  Blood Culture (routine x 2)     Status: None (Preliminary result)   Collection Time: 08/22/22 11:35 AM   Specimen: BLOOD  Result Value Ref Range Status   Specimen Description BLOOD SITE NOT SPECIFIED  Final   Special Requests   Final    BOTTLES DRAWN AEROBIC AND ANAEROBIC Blood Culture adequate volume   Culture   Final    NO GROWTH 4 DAYS Performed at [facility], [place] [address], [place], [place] [address]    Report Status PENDING  Incomplete  Culture, blood (single) w Reflex to ID Panel     Status: None (Preliminary result)   Collection Time: 08/24/22  6:44 PM   Specimen: BLOOD RIGHT HAND  Result Value Ref Range Status   Specimen Description BLOOD RIGHT HAND  Final   Special Requests   Final    BOTTLES DRAWN AEROBIC AND ANAEROBIC Blood Culture results may not be optimal due to an excessive volume of blood received in culture bottles   Culture   Final    NO GROWTH 2 DAYS Performed at [facility], [place] [address], [place], [place] [address]    Report Status PENDING  Incomplete     Labs: BNP (last 3 results) Recent Labs    05/02/22 1516 08/22/22 0907 08/25/22 0309  BNP 168.8* 171.8* 0000000*   Basic Metabolic Panel: Recent Labs  Lab 08/23/22 0724 08/24/22 1013 08/25/22 0309  08/26/22 0716 08/27/22 1016  NA 134* 135 136 136 134*  K 4.4 4.1 4.4 4.0 3.8  CL 103 103 102 103 103  CO2 20* 21* 22 21* 17*  GLUCOSE 168* 161* 236* 186* 212*  BUN 60* 68* 70* 62* 56*  CREATININE 3.50* 3.52* 3.47* 2.72* 2.21*  CALCIUM 8.1* 8.0* 8.4* 8.8* 8.9  MG 1.5*  --   --   --   --    Liver Function Tests: Recent Labs  Lab 08/22/22 0907  AST 48*  ALT 24  ALKPHOS 48  BILITOT 0.7  PROT 6.8  ALBUMIN 2.8*   No results for input(s): "LIPASE", "AMYLASE" in the last 168 hours. No results for input(s): "AMMONIA" in the last 168 hours. CBC: Recent Labs  Lab 08/22/22 0907 08/22/22 1314 08/22/22 1438 08/23/22 0724 08/24/22 1013 08/25/22 0309 08/26/22 0716  WBC 15.5*  --  13.1* 11.9* 10.9* 9.1 7.5  NEUTROABS 13.9*  --   --   --   --   --   --   HGB 10.0*   < > 9.5* 9.1* 8.4* 7.6* 9.6*  HCT 32.9*   < > 30.0* 29.0* 26.9* 25.2* 31.3*  MCV 85.9  --  84.7 82.9 84.6 85.4 85.1  PLT 219  --  201 176 154 156 165   < > = values in this interval not displayed.   Cardiac Enzymes: No results for input(s): "CKTOTAL", "CKMB", "CKMBINDEX", "TROPONINI" in the last 168 hours. BNP: Invalid input(s): "POCBNP" CBG: Recent Labs  Lab 08/26/22 0815 08/26/22 1147 08/26/22 1611 08/26/22 2106 08/27/22 0753  GLUCAP 149* 200* 381* 321* 239*   D-Dimer No results for input(s): "DDIMER" in the last 72 hours. Hgb A1c No results for input(s): "HGBA1C" in the last 72 hours. Lipid Profile No results for input(s): "CHOL", "HDL", "LDLCALC", "TRIG", "CHOLHDL", "LDLDIRECT" in the last 72 hours. Thyroid function studies No results for input(s): "TSH", "T4TOTAL", "T3FREE", "THYROIDAB" in the last 72 hours.  Invalid input(s): "FREET3" Anemia work up No results for input(s): "VITAMINB12", "FOLATE", "FERRITIN", "TIBC", "IRON", "RETICCTPCT" in the last 72 hours. Urinalysis    Component Value Date/Time   COLORURINE AMBER (A) 08/22/2022 0856   APPEARANCEUR CLEAR 08/22/2022 0856   LABSPEC 1.016  08/22/2022 0856   PHURINE 5.0 08/22/2022 0856   GLUCOSEU NEGATIVE 08/22/2022 0856   HGBUR NEGATIVE 08/22/2022 0856   BILIRUBINUR NEGATIVE 08/22/2022 0856   KETONESUR NEGATIVE 08/22/2022 0856   PROTEINUR 100 (A) 08/22/2022 0856   UROBILINOGEN 1.0 02/03/2014 0052   NITRITE NEGATIVE 08/22/2022 0856   LEUKOCYTESUR NEGATIVE 08/22/2022 0856   Sepsis Labs Recent Labs  Lab 08/23/22 0724 08/24/22 1013 08/25/22 0309 08/26/22 0716  WBC 11.9* 10.9* 9.1 7.5   Microbiology Recent Results (from the past 240 hour(s))  Resp panel by RT-PCR (RSV, Flu A&B, Covid) Anterior Nasal Swab     Status: Abnormal   Collection Time: 08/22/22  8:56 AM   Specimen: Anterior Nasal Swab  Result Value Ref Range Status   SARS Coronavirus 2 by RT PCR NEGATIVE NEGATIVE Final   Influenza A by PCR POSITIVE (A) NEGATIVE Final   Influenza B by PCR NEGATIVE NEGATIVE Final    Comment: (NOTE) The Xpert Xpress SARS-CoV-2/FLU/RSV plus assay is intended as an aid in the diagnosis of influenza from Nasopharyngeal swab specimens and should not be used as a sole basis for treatment. Nasal washings and aspirates are unacceptable for Xpert Xpress SARS-CoV-2/FLU/RSV testing.  Fact Sheet for Patients: EntrepreneurPulse.com.au  Fact Sheet for Healthcare Providers: IncredibleEmployment.be  This test is not yet approved or cleared by the [place] FDA and has been authorized for detection and/or diagnosis of SARS-CoV-2 by FDA under an Emergency Use Authorization (EUA). This EUA will remain in effect (meaning this test can be used) for the duration of the COVID-19 declaration under Section 564(b)(1) of the Act, 21 U.S.C. section 360bbb-3(b)(1), unless the authorization is terminated or revoked.     Resp Syncytial Virus by PCR NEGATIVE NEGATIVE Final    Comment: (NOTE) Fact Sheet for Patients: EntrepreneurPulse.com.au  Fact Sheet for Healthcare  Providers: IncredibleEmployment.be  This test is not yet approved or cleared by the [place] FDA and has been authorized for detection and/or diagnosis of SARS-CoV-2 by FDA under an Emergency Use Authorization (EUA). This EUA will remain in effect (meaning this test can be used) for the duration of the COVID-19 declaration under Section 564(b)(1) of the Act, 21 U.S.C. section 360bbb-3(b)(1), unless the authorization is terminated or revoked.  Performed at [facility], [place] [address] [place], [place] [address]   Blood Culture (routine x 2)     Status: Abnormal   Collection Time: 08/22/22  8:56 AM   Specimen: BLOOD  Result Value Ref Range Status   Specimen Description BLOOD SITE NOT SPECIFIED  Final   Special Requests   Final    BOTTLES DRAWN AEROBIC AND ANAEROBIC Blood Culture results may not be optimal due to an inadequate volume of blood received in culture bottles   Culture  Setup Time   Final    GRAM POSITIVE COCCI IN CHAINS IN PAIRS AEROBIC BOTTLE ONLY CRITICAL RESULT CALLED TO, READ BACK BY AND VERIFIED WITH: PHARMD J. LEDFORD 08/23/22 @ 0159 BY AB Performed at [facility], [place] [address], [place], [place] [address]    Culture STREPTOCOCCUS GROUP G (A)  Final   Report Status 08/25/2022 FINAL  Final   Organism ID, Bacteria STREPTOCOCCUS GROUP G  Final  Susceptibility   Streptococcus group g - MIC*    CLINDAMYCIN >=1 RESISTANT Resistant     AMPICILLIN <=0.25 SENSITIVE Sensitive     ERYTHROMYCIN >=8 RESISTANT Resistant     VANCOMYCIN 0.5 SENSITIVE Sensitive     CEFTRIAXONE <=0.12 SENSITIVE Sensitive     LEVOFLOXACIN 0.5 SENSITIVE Sensitive     PENICILLIN <=0.06 SENSITIVE Sensitive     * STREPTOCOCCUS GROUP G  Blood Culture ID Panel (Reflexed)     Status: Abnormal   Collection Time: 08/22/22  8:56 AM  Result Value Ref Range Status   Enterococcus faecalis NOT DETECTED NOT DETECTED Final   Enterococcus Faecium NOT DETECTED NOT  DETECTED Final   Listeria monocytogenes NOT DETECTED NOT DETECTED Final   Staphylococcus species NOT DETECTED NOT DETECTED Final   Staphylococcus aureus (BCID) NOT DETECTED NOT DETECTED Final   Staphylococcus epidermidis NOT DETECTED NOT DETECTED Final   Staphylococcus lugdunensis NOT DETECTED NOT DETECTED Final   Streptococcus species DETECTED (A) NOT DETECTED Final    Comment: Not Enterococcus species, Streptococcus agalactiae, Streptococcus pyogenes, or Streptococcus pneumoniae. CRITICAL RESULT CALLED TO, READ BACK BY AND VERIFIED WITH: PHARMD J. LEDFORD 08/23/22 @ 0159 BY AB    Streptococcus agalactiae NOT DETECTED NOT DETECTED Final   Streptococcus pneumoniae NOT DETECTED NOT DETECTED Final   Streptococcus pyogenes NOT DETECTED NOT DETECTED Final   A.calcoaceticus-baumannii NOT DETECTED NOT DETECTED Final   Bacteroides fragilis NOT DETECTED NOT DETECTED Final   Enterobacterales NOT DETECTED NOT DETECTED Final   Enterobacter cloacae complex NOT DETECTED NOT DETECTED Final   Escherichia coli NOT DETECTED NOT DETECTED Final   Klebsiella aerogenes NOT DETECTED NOT DETECTED Final   Klebsiella oxytoca NOT DETECTED NOT DETECTED Final   Klebsiella pneumoniae NOT DETECTED NOT DETECTED Final   Proteus species NOT DETECTED NOT DETECTED Final   Salmonella species NOT DETECTED NOT DETECTED Final   Serratia marcescens NOT DETECTED NOT DETECTED Final   Haemophilus influenzae NOT DETECTED NOT DETECTED Final   Neisseria meningitidis NOT DETECTED NOT DETECTED Final   Pseudomonas aeruginosa NOT DETECTED NOT DETECTED Final   Stenotrophomonas maltophilia NOT DETECTED NOT DETECTED Final   Candida albicans NOT DETECTED NOT DETECTED Final   Candida auris NOT DETECTED NOT DETECTED Final   Candida glabrata NOT DETECTED NOT DETECTED Final   Candida krusei NOT DETECTED NOT DETECTED Final   Candida parapsilosis NOT DETECTED NOT DETECTED Final   Candida tropicalis NOT DETECTED NOT DETECTED Final    Cryptococcus neoformans/gattii NOT DETECTED NOT DETECTED Final    Comment: Performed at [facility], [address], [place], [place] [address]  Remove and replace urinary cath (placed > 5 days) then obtain urine culture from new indwelling urinary catheter.     Status: None   Collection Time: 08/22/22  8:57 AM   Specimen: Urine, Catheterized  Result Value Ref Range Status   Specimen Description URINE, CATHETERIZED  Final   Special Requests NONE  Final   Culture   Final    NO GROWTH Performed at [facility], [address] [place], [place] [address]    Report Status 08/23/2022 FINAL  Final  Blood Culture (routine x 2)     Status: None (Preliminary result)   Collection Time: 08/22/22 11:35 AM   Specimen: BLOOD  Result Value Ref Range Status   Specimen Description BLOOD SITE NOT SPECIFIED  Final   Special Requests   Final    BOTTLES DRAWN AEROBIC AND ANAEROBIC Blood Culture adequate volume  Culture   Final    NO GROWTH 4 DAYS Performed at [facility], [place] [address] [place], [place] [address]    Report Status PENDING  Incomplete  Culture, blood (single) w Reflex to ID Panel     Status: None (Preliminary result)   Collection Time: 08/24/22  6:44 PM   Specimen: BLOOD RIGHT HAND  Result Value Ref Range Status   Specimen Description BLOOD RIGHT HAND  Final   Special Requests   Final    BOTTLES DRAWN AEROBIC AND ANAEROBIC Blood Culture results may not be optimal due to an excessive volume of blood received in culture bottles   Culture   Final    NO GROWTH 2 DAYS Performed at [facility], [place] [address] [place], [place] [address]    Report Status PENDING  Incomplete     Time coordinating discharge: Over 30 minutes  SIGNED:   Darliss Cheney, MD  [facility] 08/27/2022, 12:00 PM *Please note that this is a verbal dictation therefore any spelling or grammatical errors are due to the "[place] [facility]" system interpretation. If 7PM-7AM,  please contact night-coverage [email or web address]

## 2022-08-27 NOTE — Progress Notes (Signed)
0230  Pt c/o 10/10 throbbing leg pain.  Dr. Myna Hidalgo notified.  One time pain dose given.  0515 Pt moaning, crying out in pain.  Dr. Myna Hidalgo paged.  Okay to give PRN oxy early

## 2022-08-27 NOTE — TOC Transition Note (Addendum)
Transition of Care [facility]) - CM/SW Discharge Note   Patient Details  Name: Gregory [name] MRN: [id number] Date of Birth: December 29, 1960  [facility] CM/SW Contact:  Benard Halsted, [place] Phone Number: 08/27/2022, 12:47 PM   Clinical Narrative:    Patient eager to return to [facility] today. CSW made facility aware of discharge.  Patient will DC to: [facility] Anticipated DC date: 08/27/22 Family notified: Pt notifying family Transport by: Corey Harold   Per MD patient ready for DC to [facility]. RN to call report prior to discharge ([phone]). RN, patient, patient's family, and facility notified of DC. Discharge Summary and FL2 sent to facility. DC packet on chart including signed script. Ambulance transport requested for patient.   CSW will sign off for now as social work intervention is no longer needed. Please consult [place] again if new needs arise.     Final next level of care: Skilled Nursing Facility Barriers to Discharge: Barriers Resolved   Patient Goals and CMS Choice CMS Medicare.gov Compare Post Acute Care list provided to:: Patient Choice offered to / list presented to : Patient  Discharge Placement                         Discharge Plan and Services Additional resources added to the After Visit Summary for   In-house Referral: Clinical Social Work   Post Acute Care Choice: [place]                               Social Determinants of Health ([place]) Interventions [place]: No Food Insecurity (05/01/2022)  Housing: Low Risk  (05/02/2022)  Transportation Needs: No Transportation Needs (05/02/2022)  Utilities: Not At Risk (05/02/2022)  Depression (PHQ2-9): Low Risk  (03/08/2021)  Tobacco Use: High Risk (05/02/2022)     Readmission Risk Interventions    08/27/2022   12:46 PM 05/03/2022   11:53 AM 01/31/2022    2:06 PM  Readmission Risk Prevention Plan  Transportation Screening Complete  Complete Complete  Medication Review Press photographer) Complete Complete Complete  PCP or Specialist appointment within 3-5 days of discharge Complete Complete Complete  HRI or Home Care Consult Complete Complete Complete  SW Recovery Care/Counseling Consult Complete Complete Complete  Palliative Care Screening Not Applicable Not Applicable Not Applicable  Skilled Nursing Facility Complete Complete Complete

## 2022-08-27 NOTE — Progress Notes (Signed)
Patient has been increasingly agitated related to him wanting to go back to his SNF. Patient is refusing blood sugar check and scheduled IV antbx. Patient remains stable at this time. WCTM

## 2022-08-29 LAB — CULTURE, BLOOD (SINGLE): Culture: NO GROWTH

## 2022-09-05 ENCOUNTER — Inpatient Hospital Stay (HOSPITAL_COMMUNITY)
Admission: EM | Admit: 2022-09-05 | Discharge: 2022-09-07 | DRG: 812 | Disposition: A | Discharge status: Skilled Nursing Facility | Payer: Medicaid Other | Source: Skilled Nursing Facility | Attending: Internal Medicine | Admitting: Internal Medicine

## 2022-09-05 ENCOUNTER — Emergency Department (HOSPITAL_COMMUNITY): Payer: Medicaid Other

## 2022-09-05 ENCOUNTER — Other Ambulatory Visit: Payer: Self-pay

## 2022-09-05 ENCOUNTER — Encounter (HOSPITAL_COMMUNITY): Payer: Self-pay | Admitting: Emergency Medicine

## 2022-09-05 DIAGNOSIS — Z993 Dependence on wheelchair: Secondary | ICD-10-CM

## 2022-09-05 DIAGNOSIS — R0602 Shortness of breath: Secondary | ICD-10-CM

## 2022-09-05 DIAGNOSIS — Z85828 Personal history of other malignant neoplasm of skin: Secondary | ICD-10-CM

## 2022-09-05 DIAGNOSIS — Z89412 Acquired absence of left great toe: Secondary | ICD-10-CM

## 2022-09-05 DIAGNOSIS — F329 Major depressive disorder, single episode, unspecified: Secondary | ICD-10-CM | POA: Diagnosis present

## 2022-09-05 DIAGNOSIS — E1169 Type 2 diabetes mellitus with other specified complication: Secondary | ICD-10-CM | POA: Diagnosis present

## 2022-09-05 DIAGNOSIS — Z794 Long term (current) use of insulin: Secondary | ICD-10-CM

## 2022-09-05 DIAGNOSIS — N179 Acute kidney failure, unspecified: Secondary | ICD-10-CM | POA: Diagnosis present

## 2022-09-05 DIAGNOSIS — N1832 Chronic kidney disease, stage 3b: Secondary | ICD-10-CM | POA: Diagnosis present

## 2022-09-05 DIAGNOSIS — I10 Essential (primary) hypertension: Secondary | ICD-10-CM | POA: Diagnosis present

## 2022-09-05 DIAGNOSIS — N319 Neuromuscular dysfunction of bladder, unspecified: Secondary | ICD-10-CM | POA: Diagnosis present

## 2022-09-05 DIAGNOSIS — G9009 Other idiopathic peripheral autonomic neuropathy: Secondary | ICD-10-CM | POA: Diagnosis present

## 2022-09-05 DIAGNOSIS — E1122 Type 2 diabetes mellitus with diabetic chronic kidney disease: Secondary | ICD-10-CM | POA: Diagnosis present

## 2022-09-05 DIAGNOSIS — L89892 Pressure ulcer of other site, stage 2: Secondary | ICD-10-CM | POA: Diagnosis present

## 2022-09-05 DIAGNOSIS — Z981 Arthrodesis status: Secondary | ICD-10-CM

## 2022-09-05 DIAGNOSIS — Z833 Family history of diabetes mellitus: Secondary | ICD-10-CM

## 2022-09-05 DIAGNOSIS — I890 Lymphedema, not elsewhere classified: Secondary | ICD-10-CM | POA: Diagnosis present

## 2022-09-05 DIAGNOSIS — M549 Dorsalgia, unspecified: Secondary | ICD-10-CM | POA: Diagnosis present

## 2022-09-05 DIAGNOSIS — N183 Chronic kidney disease, stage 3 unspecified: Secondary | ICD-10-CM | POA: Diagnosis present

## 2022-09-05 DIAGNOSIS — K219 Gastro-esophageal reflux disease without esophagitis: Secondary | ICD-10-CM | POA: Diagnosis present

## 2022-09-05 DIAGNOSIS — I5032 Chronic diastolic (congestive) heart failure: Secondary | ICD-10-CM | POA: Diagnosis present

## 2022-09-05 DIAGNOSIS — Z8619 Personal history of other infectious and parasitic diseases: Secondary | ICD-10-CM

## 2022-09-05 DIAGNOSIS — Z6838 Body mass index (BMI) 38.0-38.9, adult: Secondary | ICD-10-CM

## 2022-09-05 DIAGNOSIS — Z89511 Acquired absence of right leg below knee: Secondary | ICD-10-CM | POA: Diagnosis not present

## 2022-09-05 DIAGNOSIS — J449 Chronic obstructive pulmonary disease, unspecified: Secondary | ICD-10-CM | POA: Diagnosis present

## 2022-09-05 DIAGNOSIS — D62 Acute posthemorrhagic anemia: Principal | ICD-10-CM | POA: Diagnosis present

## 2022-09-05 DIAGNOSIS — D638 Anemia in other chronic diseases classified elsewhere: Secondary | ICD-10-CM | POA: Diagnosis present

## 2022-09-05 DIAGNOSIS — L03818 Cellulitis of other sites: Secondary | ICD-10-CM | POA: Diagnosis present

## 2022-09-05 DIAGNOSIS — L03116 Cellulitis of left lower limb: Secondary | ICD-10-CM | POA: Insufficient documentation

## 2022-09-05 DIAGNOSIS — Z8616 Personal history of COVID-19: Secondary | ICD-10-CM | POA: Diagnosis not present

## 2022-09-05 DIAGNOSIS — F17210 Nicotine dependence, cigarettes, uncomplicated: Secondary | ICD-10-CM | POA: Diagnosis present

## 2022-09-05 DIAGNOSIS — E669 Obesity, unspecified: Secondary | ICD-10-CM | POA: Diagnosis present

## 2022-09-05 DIAGNOSIS — D649 Anemia, unspecified: Secondary | ICD-10-CM | POA: Diagnosis not present

## 2022-09-05 DIAGNOSIS — F419 Anxiety disorder, unspecified: Secondary | ICD-10-CM | POA: Diagnosis present

## 2022-09-05 DIAGNOSIS — L0390 Cellulitis, unspecified: Secondary | ICD-10-CM | POA: Diagnosis present

## 2022-09-05 DIAGNOSIS — Z8249 Family history of ischemic heart disease and other diseases of the circulatory system: Secondary | ICD-10-CM

## 2022-09-05 DIAGNOSIS — J9611 Chronic respiratory failure with hypoxia: Secondary | ICD-10-CM

## 2022-09-05 DIAGNOSIS — E1165 Type 2 diabetes mellitus with hyperglycemia: Secondary | ICD-10-CM | POA: Diagnosis not present

## 2022-09-05 DIAGNOSIS — Z85820 Personal history of malignant melanoma of skin: Secondary | ICD-10-CM

## 2022-09-05 DIAGNOSIS — Z89422 Acquired absence of other left toe(s): Secondary | ICD-10-CM

## 2022-09-05 DIAGNOSIS — E785 Hyperlipidemia, unspecified: Secondary | ICD-10-CM | POA: Diagnosis present

## 2022-09-05 DIAGNOSIS — L89522 Pressure ulcer of left ankle, stage 2: Secondary | ICD-10-CM | POA: Diagnosis present

## 2022-09-05 DIAGNOSIS — Z7985 Long-term (current) use of injectable non-insulin antidiabetic drugs: Secondary | ICD-10-CM

## 2022-09-05 DIAGNOSIS — D631 Anemia in chronic kidney disease: Secondary | ICD-10-CM | POA: Diagnosis present

## 2022-09-05 DIAGNOSIS — I130 Hypertensive heart and chronic kidney disease with heart failure and stage 1 through stage 4 chronic kidney disease, or unspecified chronic kidney disease: Secondary | ICD-10-CM | POA: Diagnosis present

## 2022-09-05 DIAGNOSIS — B955 Unspecified streptococcus as the cause of diseases classified elsewhere: Secondary | ICD-10-CM | POA: Diagnosis present

## 2022-09-05 DIAGNOSIS — Z91040 Latex allergy status: Secondary | ICD-10-CM

## 2022-09-05 DIAGNOSIS — E119 Type 2 diabetes mellitus without complications: Secondary | ICD-10-CM

## 2022-09-05 DIAGNOSIS — Z79899 Other long term (current) drug therapy: Secondary | ICD-10-CM

## 2022-09-05 DIAGNOSIS — G894 Chronic pain syndrome: Secondary | ICD-10-CM | POA: Diagnosis present

## 2022-09-05 LAB — CBC WITH DIFFERENTIAL/PLATELET
Abs Immature Granulocytes: 0.03 10*3/uL (ref 0.00–0.07)
Basophils Absolute: 0.1 10*3/uL (ref 0.0–0.1)
Basophils Relative: 1 %
Eosinophils Absolute: 0.1 10*3/uL (ref 0.0–0.5)
Eosinophils Relative: 1 %
HCT: 25.2 % — ABNORMAL LOW (ref 39.0–52.0)
Hemoglobin: 7.4 g/dL — ABNORMAL LOW (ref 13.0–17.0)
Immature Granulocytes: 0 %
Lymphocytes Relative: 20 %
Lymphs Abs: 1.5 10*3/uL (ref 0.7–4.0)
MCH: 26.1 pg (ref 26.0–34.0)
MCHC: 29.4 g/dL — ABNORMAL LOW (ref 30.0–36.0)
MCV: 88.7 fL (ref 80.0–100.0)
Monocytes Absolute: 0.7 10*3/uL (ref 0.1–1.0)
Monocytes Relative: 10 %
Neutro Abs: 5 10*3/uL (ref 1.7–7.7)
Neutrophils Relative %: 68 %
Platelets: 279 10*3/uL (ref 150–400)
RBC: 2.84 MIL/uL — ABNORMAL LOW (ref 4.22–5.81)
RDW: 16.1 % — ABNORMAL HIGH (ref 11.5–15.5)
WBC: 7.4 10*3/uL (ref 4.0–10.5)
nRBC: 0 % (ref 0.0–0.2)

## 2022-09-05 LAB — COMPREHENSIVE METABOLIC PANEL
ALT: 17 U/L (ref 0–44)
AST: 10 U/L — ABNORMAL LOW (ref 15–41)
Albumin: 2.8 g/dL — ABNORMAL LOW (ref 3.5–5.0)
Alkaline Phosphatase: 61 U/L (ref 38–126)
Anion gap: 6 (ref 5–15)
BUN: 31 mg/dL — ABNORMAL HIGH (ref 8–23)
CO2: 26 mmol/L (ref 22–32)
Calcium: 8.2 mg/dL — ABNORMAL LOW (ref 8.9–10.3)
Chloride: 103 mmol/L (ref 98–111)
Creatinine, Ser: 2.16 mg/dL — ABNORMAL HIGH (ref 0.61–1.24)
GFR, Estimated: 34 mL/min — ABNORMAL LOW (ref >60)
Glucose, Bld: 280 mg/dL — ABNORMAL HIGH (ref 70–99)
Potassium: 5 mmol/L (ref 3.5–5.1)
Sodium: 135 mmol/L (ref 135–145)
Total Bilirubin: 0.4 mg/dL (ref 0.3–1.2)
Total Protein: 6.9 g/dL (ref 6.5–8.1)

## 2022-09-05 LAB — BRAIN NATRIURETIC PEPTIDE: B Natriuretic Peptide: 39.9 pg/mL (ref 0.0–100.0)

## 2022-09-05 LAB — PROTIME-INR
INR: 1 (ref 0.8–1.2)
Prothrombin Time: 12.9 seconds (ref 11.4–15.2)

## 2022-09-05 LAB — LACTIC ACID, PLASMA: Lactic Acid, Venous: 1 mmol/L (ref 0.5–1.9)

## 2022-09-05 LAB — PREPARE RBC (CROSSMATCH)

## 2022-09-05 LAB — GLUCOSE, CAPILLARY: Glucose-Capillary: 134 mg/dL — ABNORMAL HIGH (ref 70–99)

## 2022-09-05 LAB — POC OCCULT BLOOD, ED: Fecal Occult Bld: NEGATIVE

## 2022-09-05 MED ORDER — OXYCODONE-ACETAMINOPHEN 5-325 MG PO TABS
1.0000 | ORAL_TABLET | Freq: Once | ORAL | Status: AC
  Administered 2022-09-05: 1 via ORAL
  Filled 2022-09-05: qty 1

## 2022-09-05 MED ORDER — NICOTINE 21 MG/24HR TD PT24
21.0000 mg | MEDICATED_PATCH | Freq: Every day | TRANSDERMAL | Status: DC
  Administered 2022-09-06 – 2022-09-07 (×3): 21 mg via TRANSDERMAL
  Filled 2022-09-05 (×3): qty 1

## 2022-09-05 MED ORDER — VANCOMYCIN HCL 1750 MG/350ML IV SOLN
1750.0000 mg | Freq: Once | INTRAVENOUS | Status: AC
  Administered 2022-09-05: 1750 mg via INTRAVENOUS
  Filled 2022-09-05: qty 350

## 2022-09-05 MED ORDER — SODIUM CHLORIDE 0.9% IV SOLUTION: Freq: Once | INTRAVENOUS | Status: AC

## 2022-09-05 MED ORDER — SODIUM CHLORIDE 0.9 % IV SOLN
2.0000 g | Freq: Two times a day (BID) | INTRAVENOUS | Status: DC
  Administered 2022-09-06 – 2022-09-07 (×3): 2 g via INTRAVENOUS
  Filled 2022-09-05 (×3): qty 12.5

## 2022-09-05 MED ORDER — INSULIN GLARGINE-YFGN 100 UNIT/ML ~~LOC~~ SOLN
10.0000 [IU] | Freq: Every day | SUBCUTANEOUS | Status: DC
  Administered 2022-09-06: 10 [IU] via SUBCUTANEOUS
  Filled 2022-09-05: qty 0.1

## 2022-09-05 MED ORDER — SODIUM CHLORIDE 0.9 % IV SOLN
2.0000 g | Freq: Once | INTRAVENOUS | Status: AC
  Administered 2022-09-05: 2 g via INTRAVENOUS
  Filled 2022-09-05: qty 12.5

## 2022-09-05 MED ORDER — VANCOMYCIN HCL 1750 MG/350ML IV SOLN: 1750.0000 mg | INTRAVENOUS | Status: DC

## 2022-09-05 MED ORDER — MORPHINE SULFATE (PF) 4 MG/ML IV SOLN
4.0000 mg | Freq: Once | INTRAVENOUS | Status: AC
  Administered 2022-09-05: 4 mg via INTRAVENOUS
  Filled 2022-09-05: qty 1

## 2022-09-05 MED ORDER — LACTATED RINGERS IV BOLUS
500.0000 mL | Freq: Once | INTRAVENOUS | Status: AC
  Administered 2022-09-05: 500 mL via INTRAVENOUS

## 2022-09-05 MED ORDER — INSULIN ASPART 100 UNIT/ML IJ SOLN
0.0000 [IU] | Freq: Three times a day (TID) | INTRAMUSCULAR | Status: DC
  Administered 2022-09-05: 2 [IU] via SUBCUTANEOUS
  Administered 2022-09-06: 5 [IU] via SUBCUTANEOUS
  Administered 2022-09-06: 2 [IU] via SUBCUTANEOUS
  Administered 2022-09-06 – 2022-09-07 (×3): 3 [IU] via SUBCUTANEOUS
  Administered 2022-09-07: 2 [IU] via SUBCUTANEOUS
  Filled 2022-09-05: qty 0.15

## 2022-09-05 NOTE — Assessment & Plan Note (Addendum)
-  Presenting Hgb of 7.4 with prior baseline of 8-9 with hx of anemia due to CKD. MCV within normal limits. FOBT negative with minimal brown stool noted in vault per ED PA. Pt denies dark stool.  -no colonoscopy or endoscopy found on record. Pt refuses any scopes if warranted.  -receiving 1u PRBC transfusion. Follow post H/H -will check iron, vitamin [id number] and folic, retic count, TSH

## 2022-09-05 NOTE — Assessment & Plan Note (Signed)
-  Keep on baseline 2L and monitor on continuous telemetry -dyspnea symptoms likely from acute anemia. No signs of acute COPD exacerbation.

## 2022-09-05 NOTE — Assessment & Plan Note (Signed)
-  Baseline creatinine 1.7-2.2 with presenting Cr of 2.16

## 2022-09-05 NOTE — Assessment & Plan Note (Signed)
-  holding Lasix for now while receiving blood transfusion. Will reassess tomorrow after transfusion.

## 2022-09-05 NOTE — Assessment & Plan Note (Signed)
-   Home regimen on 22 units Lantus with sliding scale -start with 10 units daily and moderate SSI

## 2022-09-05 NOTE — Progress Notes (Signed)
A consult was received from an ED provider for vancomycin and cefepime per pharmacy dosing.  The patient's profile has been reviewed for ht/wt/allergies/indication/available labs.    A one time order has been placed for cefepime 2 g IV + vancomycin 1750 mg IV once.    Further antibiotics/pharmacy consults should be ordered by admitting physician if indicated.                       Thank you, Lenis Noon, PharmD 09/05/2022  4:59 PM

## 2022-09-05 NOTE — Assessment & Plan Note (Addendum)
-  recently discharged last week for the same and initially treated with Vancomycin and Cefepime. Then narrowed to Rocephin and ultimately discharged with 2 weeks of Augmentin based on susceptibilities from streptococcus group G bacteremia.  -will continue with broadened IV Vancomycin and cefepime since he appears to have failed outpatient Augmentin -pending blood cultures -wound care consulted

## 2022-09-05 NOTE — Progress Notes (Signed)
Pharmacy Antibiotic Note  Gregory [name] is a 62 y.o. male admitted on 09/05/2022 with  wound infection .  Pharmacy has been consulted for cefepime and vancomycin dosing.  Today, 09/05/22 WBC WNL SCr 2.16, CrCl ~36 mL/min Afebrile  Plan: Cefepime 2 g IV q12h Vancomycin 1750 mg IV q48h for estimated AUC of 447. Goal AUC 400-550. Check levels at steady state as needed. Monitor renal function.      Temp (24hrs), Avg:98.4 F (36.9 C), Min:98 F (36.7 C), Max:98.8 F (37.1 C)  Recent Labs  Lab 09/05/22 1635 09/05/22 1647  WBC 7.4  --   CREATININE 2.16*  --   LATICACIDVEN  --  1.0    Estimated Creatinine Clearance: 36.7 mL/min (A) (by C-G formula based on SCr of 2.16 mg/dL (H)).    Allergies  Allergen Reactions   Latex Other (See Comments)    "ALLERGIC," per MAR    Antimicrobials this admission: cefepime 3/20 >>  vancomycin 3/20 >>   Dose adjustments this admission:  Microbiology results: 3/20 BCx:   Lenis Noon, PharmD 09/05/2022 9:18 PM

## 2022-09-05 NOTE — ED Triage Notes (Signed)
Patient presents from a facility due to low hemoglobin levels per patient. Patient's left leg was also swollen and weeping for over a week.    EMS vitals: 171 CBG 162/84 BP 94 HR 20 RR 91% SPO2 on room air 97.9 Temp

## 2022-09-05 NOTE — ED Notes (Signed)
Blood cultures drawn at 1724, prior to antibiotic administration.

## 2022-09-05 NOTE — Assessment & Plan Note (Signed)
-  hold antihypertensives with acute anemia

## 2022-09-05 NOTE — H&P (Signed)
History and Physical    Patient: Gregory [name] [name] DOB: 05-27-1961 DOA: 09/05/2022 DOS: the patient was seen and examined on 09/05/2022 PCP: [name], MD  Patient coming from: SNF  Chief Complaint:  Chief Complaint  Patient presents with   Leg Swelling   Abnormal Lab   HPI: Gregory [name] is a 62 y.o. 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   Pt limited historian as he was ill appearing and was frustrated with questions.   He was just hospitalized from 3/6-3/11 with severe sepsis due to left LE cellulitis and multifocal pneumonia and Stretococcus group G bacteremia. He was treated initially with Vancomycin,Cefepime and azithromycin. He was then narrowed to Rocephin and azithromycin based on susceptibilities. Subsequently he was discharged on 2 weeks of Augmentin.  He was also treated for acute on chronic hypoxic respiratory failure due to COPD exacerbation from both multifocal pneumonia and influenza A treated with Tamiflu and antibiotics as above.   Today he presents with increasing shortness of breath since last night at rest. Pt wheelchair bound at baseline due to right AKA and several amputation of phalanges of left foot. CBC with worsening Hgb of 7.4 from a prior baseline of 8-9. Hct also low at 25. MCV within normal limits.  FOBT was negative and ED PA saw minimal brown stool in vault.  Pt has not seen dark stool and is not on anticoagulation. Says he had a colonoscopy in the past but could not tell me when or the results. Says he does not want any scopes done.   Creatinine has improved from prior back to baseline around 2.16. Glucose elevated to 280. No other significant electrolyte abnormalities.   Repeat left foot X-ray with severe soft tissue  swelling of ankle and foot without clear signs of osteomyelitis. Recently had MRI of the foot on 3/7 without evidence of acute osteomyelitis.   He was started on IV Vancomycin and cefepime and also started on 1u pRBC transfusion in ED.  Hospitalist then consulted for admission for acute anemia and left LE cellulitis.    Review of Systems: Unable to review all systems due to lack of cooperation from patient. Past Medical History:  Diagnosis Date   Acquired absence of left great toe ([place])    Acquired absence of right leg below knee (HCC)    Acute osteomyelitis of calcaneum, right ([place]) 10/02/2016   Acute respiratory failure (HCC)    Amputation stump infection ([place]) 08/12/2018   Anemia    Basal cell carcinoma, eyelid    Cancer (HCC)    Candidiasis 08/12/2018   CHF (congestive heart failure) (HCC)    chronic diastolic    Chronic back pain    Chronic kidney disease    stage 3    Chronic multifocal osteomyelitis (HCC)    of ankle and foot    Chronic pain syndrome    COPD (chronic obstructive pulmonary disease) (HCC)    DDD (degenerative disc disease), lumbar    Depression    major depressive disorder    Diabetes mellitus without complication ([place])    type 2    Diabetic ulcer of toe of left foot ([place]) 10/02/2016   Difficult intubation    Difficulty in walking, not elsewhere classified    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   Foot  drop, bilateral    Foot drop, right 12/27/2015   GERD (gastroesophageal reflux disease)    Herpesviral vesicular dermatitis    History of COVID-19 03/15/2022   Hyperlipidemia    Hyperlipidemia    Hypertension    Insomnia    Malignant melanoma of other parts of face ([place])    Melanoma ([place])    MRSA bacteremia    Muscle weakness (generalized)    Nasal congestion    Necrosis of toe ([place]) 07/08/2018   Neuromuscular dysfunction of bladder    Osteomyelitis (HCC)    Osteomyelitis (HCC)    Osteomyelitis (HCC)    right BKA   Other idiopathic peripheral  autonomic neuropathy    Retention of urine, unspecified    Squamous cell carcinoma of skin    Unsteadiness on feet    Urinary retention    Urinary retention    Urinary tract infection    Wears dentures    Wears glasses    Past Surgical History:  Procedure Laterality Date   AMPUTATION Left 03/29/2017   Procedure: LEFT GREAT TOE AMPUTATION AT METATARSOPHALANGEAL JOINT;  Surgeon: [name], MD;  Location: [place];  Service: Orthopedics;  Laterality: Left;   AMPUTATION Right 03/29/2017   Procedure: RIGHT BELOW KNEE AMPUTATION;  Surgeon: [name], MD;  Location: [place];  Service: Orthopedics;  Laterality: Right;   AMPUTATION Left 06/06/2018   Procedure: LEFT 2ND TOE AMPUTATION;  Surgeon: [name], MD;  Location: [place];  Service: Orthopedics;  Laterality: Left;   AMPUTATION Right 11/19/2018   Procedure: AMPUTATION ABOVE KNEE;  Surgeon: [name], MD;  Location: [place];  Service: Orthopedics;  Laterality: Right;   ANTERIOR CERVICAL CORPECTOMY N/A 11/25/2015   Procedure: ANTERIOR CERVICAL FIVE CORPECTOMY Cervical four - six fusion;  Surgeon: Consuella Lose, MD;  Location: [place] [facility];  Service: Neurosurgery;  Laterality: N/A;  ANTERIOR CERVICAL FIVE CORPECTOMY Cervical four - six fusion   APPLICATION OF WOUND VAC Right 10/22/2018   Procedure: Application Of Wound Vac;  Surgeon: [name], MD;  Location: [place];  Service: Orthopedics;  Laterality: Right;   CYSTOSCOPY WITH BIOPSY N/A 05/01/2022   Procedure: CYSTOSCOPY WITH URETHRAL BIOPSY;  Surgeon: Robley Fries, MD;  Location: [facility];  Service: Urology;  Laterality: N/A;  1 HR   CYSTOSCOPY WITH RETROGRADE URETHROGRAM N/A 04/25/2018   Procedure: CYSTOSCOPY WITH RETROGRADE URETHROGRAM/ BALLOON DILATION;  Surgeon: Kathie Rhodes, MD;  Location: [facility];  Service: Urology;  Laterality: N/A;   CYSTOSCOPY WITH URETHRAL DILATATION N/A 05/01/2022   Procedure: BALLOON DILATION WITH  OPTILUME;  Surgeon: Robley Fries, MD;   [facility];  Service: Urology;  Laterality: N/A;   MULTIPLE TOOTH EXTRACTIONS     POSTERIOR CERVICAL FUSION/FORAMINOTOMY N/A 11/29/2015   Procedure: Cervical Three-Cervical Seven Posterior Cervical Laminectomy with Fusion;  Surgeon: Consuella Lose, MD;  Location: [place] [facility];  Service: Neurosurgery;  Laterality: N/A;  Cervical Three-Cervical Seven Posterior Cervical Laminectomy with Fusion   STUMP REVISION Right 10/22/2018   Procedure: REVISION RIGHT BELOW KNEE AMPUTATION;  Surgeon: [name], MD;  Location: [place];  Service: Orthopedics;  Laterality: Right;   STUMP REVISION Right 12/05/2018   Procedure: Revision Right Above Knee Amputation;  Surgeon: [name], MD;  Location: [place];  Service: Orthopedics;  Laterality: Right;   STUMP REVISION Right 05/29/2019   Procedure: REVISION RIGHT ABOVE KNEE AMPUTATION;  Surgeon: [name], MD;  Location: [place];  Service: Orthopedics;  Laterality: Right;   STUMP REVISION Right 06/24/2019   Procedure: STUMP REVISION;  Surgeon: [name], MD;  Location: [place];  Service: Orthopedics;  Laterality: Right;   Social History:  reports that he has been smoking cigarettes. He has been smoking an average of .5 packs per day. He has never used smokeless tobacco. He reports that he does not drink alcohol and does not use drugs.  Allergies  Allergen Reactions   Latex Other (See Comments)    "ALLERGIC," per [facility]    Family History  Problem Relation Age of Onset   Diabetes Mother    Heart disease Mother    Cancer Mother    Cancer Father    Bone cancer Father    Prostate cancer Father    Cancer Paternal Grandfather    Cancer Other    Diabetes Other     Prior to Admission medications   Medication Sig Start Date End Date Taking? Authorizing Provider  Amino Acids-Protein Hydrolys (PRO-STAT) LIQD Take 30 mLs by mouth in the morning.    [provider]  amoxicillin-clavulanate (AUGMENTIN) 875-125 MG tablet Take 1 tablet by mouth 2  (two) times daily for 14 days. 08/27/22 09/10/22  Darliss Cheney, MD  cetirizine (ZYRTEC) 10 MG tablet Take 10 mg by mouth daily.    [provider]  cyanocobalamin 1000 MCG tablet Take 1,000 mcg by mouth daily.    [provider]  docusate sodium (COLACE) 100 MG capsule Take 1 capsule (100 mg total) by mouth 2 (two) times daily. 05/31/19   Amin, Ankit Chirag, MD  eszopiclone (LUNESTA) 2 MG TABS tablet Take 2 mg by mouth at bedtime as needed for sleep (immediately before bedtime and HOLD FOR SEDATION).    [provider]  fenofibrate (TRICOR) 48 MG tablet Take 48 mg by mouth daily.    [provider]  ferrous sulfate 325 (65 FE) MG tablet Take 1 tablet (325 mg total) by mouth daily with breakfast. 11/23/18   Matilde Haymaker, MD  fluticasone [facility]) 50 MCG/ACT nasal spray Place 1 spray into both nostrils in the morning.    [provider]  gabapentin (NEURONTIN) 300 MG capsule Take 300 mg by mouth 3 (three) times daily.    [provider]  Glucagon, rDNA, (GLUCAGON EMERGENCY) 1 MG KIT Inject 1 mg into the vein as needed (for hypolycemia).    [provider]  guaiFENesin 200 MG tablet Take 200 mg by mouth every 6 (six) hours as needed (congestion).    [provider]  hydrOXYzine (ATARAX) 25 MG tablet Take 25 mg by mouth every 6 (six) hours as needed for itching or anxiety.    [provider]  insulin glargine (LANTUS SOLOSTAR) 100 UNIT/ML Solostar Pen Inject 22 Units into the skin daily. 08/27/22   Darliss Cheney, MD  insulin lispro (HUMALOG) 100 UNIT/ML KwikPen Inject 0-10 Units into the skin See admin instructions. Inject 0-10 units into the skin three times a day and at bedtime, PER SLIDING SCALE:  BGL 151-200 = 2 units 201-250 = 4 units 251-300 = 6 units 301-350 =  8 units 351-400 = 10 units Patient taking differently: Inject 3-20 Units into the skin See admin instructions. Inject 3-20 units into the skin before meals and  at bedtime, PER SLIDING SCALE: BGL 121-150 = 3 units; 151-200 = 4 units; 201-250 = 7 units; 251-300 = 11 units; 301-350 = 15 units; 351-400 = 20 units; >400 = CALL MD 01/31/22   Dahal,  Marlowe Aschoff, MD  ipratropium-albuterol (DUONEB) 0.5-2.5 (3) MG/3ML SOLN Take 3 mLs by nebulization every 6 (six) hours as needed (for shortness of breath).    [provider]  lactulose (CHRONULAC) 10 GM/15ML solution Take 15 mLs (10 g total) by mouth 3 (three) times daily as needed for moderate constipation. Patient taking differently: Take 10 g by mouth every 8 (eight) hours as needed for moderate constipation. 01/31/22   Terrilee Croak, MD  levalbuterol (XOPENEX) 0.63 MG/3ML nebulizer solution Take 3 mLs (0.63 mg total) by nebulization every 6 (six) hours as needed for wheezing or shortness of breath. 01/31/22   Terrilee Croak, MD  Lidocaine 4 % PTCH Apply 1 patch topically See admin instructions. Apply 1 patch to both the right shoulder and the right stump at 9 AM daily- remove at 9 PM    [provider]  magnesium oxide (MAG-OX) 400 MG tablet Take 400 mg by mouth 2 (two) times daily.    [provider]  melatonin 3 MG TABS tablet Take 3 mg by mouth at bedtime.    [provider]  Menthol, Topical Analgesic, (BIOFREEZE) 4 % GEL Apply 1 Application topically See admin instructions. Apply to right leg/stump every 8 hours as needed for pain    [provider]  Meth-Hyo-M Bl-Na Phos-Ph Sal (URIBEL) 118 MG CAPS Take 1 capsule by mouth every 8 (eight) hours as needed (bladder pain).    [provider]  methenamine (MANDELAMINE) 1 g tablet Take 1 g by mouth daily.    [provider]  metoprolol tartrate (LOPRESSOR) 25 MG tablet Take 0.5 tablets (12.5 mg total) by mouth 2 (two) times daily. 07/18/19 09/06/22  Elodia Florence., MD  mirabegron ER (MYRBETRIQ) 50 MG TB24 tablet Take 50 mg by mouth daily.    [provider]  Multiple Vitamins-Minerals (MULTIVITAMIN  WITH MINERALS) tablet Take 1 tablet by mouth daily.    [provider]  Multiple Vitamins-Minerals (PRESERVISION AREDS 2) CAPS Take 1 capsule by mouth in the morning and at bedtime.    [provider]  naloxone [facility]) 4 MG/0.1ML LIQD nasal spray kit Place 1 spray into the nose as needed (poorly responsive or turning blue).    [provider]  nystatin cream (MYCOSTATIN) Apply 1 Application topically See admin instructions. Apply to wound site every day shift and as needed, for wound care    [provider]  omeprazole (PRILOSEC) 20 MG capsule Take 20 mg by mouth daily.    [provider]  ondansetron (ZOFRAN) 4 MG tablet Take 4 mg by mouth every 8 (eight) hours as needed for nausea.    [provider]  oxybutynin (DITROPAN-XL) 10 MG 24 hr tablet Take 10 mg by mouth daily.    [provider]  oxyCODONE (OXY IR/ROXICODONE) 5 MG immediate release tablet Take 1 tablet (5 mg total) by mouth every 8 (eight) hours as needed for severe pain (pain). 08/27/22   Darliss Cheney, MD  Oxycodone HCl 20 MG TABS Take 1 tablet (20 mg total) by mouth See admin instructions for 10 days. Take 20 mg by mouth three times a day and hold for sedation 08/27/22 09/06/22  Darliss Cheney, MD  OXYGEN Inhale 2 L/min into the lungs as needed (for shortness of breath- to maintain sats >90%).    [provider]  oxymetazoline (AFRIN 12 HOUR) 0.05 % nasal spray Place 1 spray into both nostrils every 12 (twelve) hours as needed for congestion.  [provider]  phenazopyridine (PYRIDIUM) 100 MG tablet Take 100 mg by mouth every 8 (eight) hours as needed for pain.    [provider]  Polyethyl Glycol-Propyl Glycol (LUBRICANT EYE DROPS) 0.4-0.3 % SOLN Place 2 drops into both eyes every 4 (four) hours as needed (dryness/itching).    [provider]  polyethylene glycol (MIRALAX / GLYCOLAX) 17 g packet Take 17 g by mouth daily as needed for mild  constipation.    [provider]  pregabalin (LYRICA) 150 MG capsule Take 1 capsule (150 mg total) by mouth 2 (two) times daily. 07/07/21   Thurnell Lose, MD  promethazine (PHENERGAN) 12.5 MG tablet Take 12.5 mg by mouth every 6 (six) hours as needed for nausea or vomiting.    [provider]  rOPINIRole (REQUIP) 2 MG tablet Take 2 mg by mouth 2 (two) times daily.    [provider]  rosuvastatin (CRESTOR) 10 MG tablet Take 10 mg by mouth at bedtime.    [provider]  senna (SENOKOT) 8.6 MG TABS tablet Take 1 tablet by mouth at bedtime.    [provider]  SPIRIVA RESPIMAT 1.25 MCG/ACT AERS Inhale 1 each into the lungs in the morning.    [provider]  tamsulosin (FLOMAX) 0.4 MG CAPS capsule Take 0.8 mg by mouth daily. 11/26/13   [provider]  tiZANidine (ZANAFLEX) 4 MG tablet Take 4 mg by mouth every 6 (six) hours as needed for muscle spasms.    [provider]  TRULICITY [id number] SOPN Inject 0.75 mg into the skin every Wednesday.    [provider]    Physical Exam: Vitals:   09/05/22 1900 09/05/22 1915 09/05/22 1952 09/05/22 2030  BP: (!) 141/95 (!) 152/79 (!) 142/90 (!) 167/66  Pulse: 99 93 97 93  Resp: 13 17 20 19   Temp: 98.3 F (36.8 C)  98.8 F (37.1 C) 98 F (36.7 C)  TempSrc: Oral  Oral Oral  SpO2: 92% 96% 96% 91%   Constitutional: NAD,ill appearing male with rigors and generalized pallor laying in bed  Eyes:  lids and conjunctivae normal ENMT: Mucous membranes are moist.  Neck: normal, supple Respiratory: clear to auscultation bilaterally, no wheezing, no crackles. Normal respiratory effort. No accessory muscle use.  Cardiovascular: Regular rate and rhythm, no murmurs / rubs / gallops.  Abdomen: no tenderness,  Bowel sounds positive.  Musculoskeletal: no clubbing / cyanosis. Right AKA. Multiple amputations of phalanges of left foot.  Skin: chronic venous/lymphedema changes of the  entire left LE extremity.  Numerous superficial non-healing ulcers. Spreading erythema from up foot to medial thigh.Amputation of the first to third digit.      Neurologic: CN 2-12 grossly intact.  Psychiatric: Normal judgment and insight. Alert and oriented x 3. Frustrated mood. Data Reviewed:  See HPI  Assessment and Plan: * Acute anemia -Presenting Hgb of 7.4 with prior baseline of 8-9 with hx of anemia due to CKD. MCV within normal limits. FOBT negative with minimal brown stool noted in vault per ED PA. Pt denies dark stool.  -no colonoscopy or endoscopy found on record. Pt refuses any scopes if warranted.  -receiving 1u PRBC transfusion. Follow post H/H -will check iron, vitamin [id number] and folic, retic count, TSH  Chronic hypoxic respiratory failure (HCC) -Keep on baseline 2L and monitor on continuous telemetry -dyspnea symptoms likely from acute anemia. No signs of acute COPD exacerbation.   Stage 3b chronic kidney disease (CKD) (HCC) -Baseline creatinine 1.7-2.2  with presenting Cr of 2.16  Cellulitis of left lower extremity -recently discharged last week for the same and initially treated with Vancomycin and Cefepime. Then narrowed to Rocephin and ultimately discharged with 2 weeks of Augmentin based on susceptibilities from streptococcus group G bacteremia.  -will continue with broadened IV Vancomycin and cefepime since he appears to have failed outpatient Augmentin -pending blood cultures -wound care consulted  DM2 (diabetes mellitus, type 2) ([place]) - Home regimen on 22 units Lantus with sliding scale -start with 10 units daily and moderate SSI  HTN (hypertension) -hold antihypertensives with acute anemia  Chronic diastolic HF (heart failure) (HCC) -holding Lasix for now while receiving blood transfusion. Will reassess tomorrow after transfusion.       Advance Care Planning:Full  Consults: none  Family Communication: none at bedside  Severity of Illness: The  appropriate patient status for this patient is INPATIENT. Inpatient status is judged to be reasonable and necessary in order to provide the required intensity of service to ensure the patient's safety. The patient's presenting symptoms, physical exam findings, and initial radiographic and laboratory data in the context of their chronic comorbidities is felt to place them at high risk for further clinical deterioration. Furthermore, it is not anticipated that the patient will be medically stable for discharge from the hospital within 2 midnights of admission.   * I certify that at the point of admission it is my clinical judgment that the patient will require inpatient hospital care spanning beyond 2 midnights from the point of admission due to high intensity of service, high risk for further deterioration and high frequency of surveillance required.*  Author: Orene Desanctis, DO 09/05/2022 9:27 PM  For on call review [email or web address].

## 2022-09-05 NOTE — ED Provider Notes (Signed)
[name] EMERGENCY DEPARTMENT AT [facility] Provider Note   CSN: [id number] Arrival date & time: 09/05/22  1414     History  Chief Complaint  Patient presents with   Leg Swelling   Abnormal Lab    Gregory [name] is a 62 y.o. male.  Patient presents the emergency department due to reportedly low hemoglobin levels.  The patient states he is unsure as to what the hemoglobin level was but that he was told he needed a blood transfusion.  Patient also complains of worsening left lower extremity swelling which has been weeping for the past week.  Patient states he feels short of breath.  His room air saturation upon arrival was 91%.  While laying down he was noted to desat to the mid 80s.  Patient complains chest pain, abdominal pain, nausea, vomiting.  He does complain of pain in the left lower extremity.  The patient was recently admitted to the hospital from March 6 through March 11.  Patient was admitted due to sepsis to the left lower extremity, bacteremia, left lower extremity cellulitis.  Patient also admitted due to acute on chronic hypoxic respiratory failure, acute COPD exacerbation with multifocal pneumonia.  Past medical history also significant for previous AKA of the right leg.  Past medical history significant for osteomyelitis, CHF, chronic pain syndrome, chronic back pain, respiratory failure, history of epidural abscess, diabetes mellitus, CKD  HPI     Home Medications Prior to Admission medications   Medication Sig Start Date End Date Taking? Authorizing Provider  Amino Acids-Protein Hydrolys (PRO-STAT) LIQD Take 30 mLs by mouth in the morning.    [provider]  amoxicillin-clavulanate (AUGMENTIN) 875-125 MG tablet Take 1 tablet by mouth 2 (two) times daily for 14 days. 08/27/22 09/10/22  Darliss Cheney, MD  cetirizine (ZYRTEC) 10 MG tablet Take 10 mg by mouth daily.    [provider]  cyanocobalamin 1000 MCG tablet Take 1,000 mcg by mouth daily.     [provider]  docusate sodium (COLACE) 100 MG capsule Take 1 capsule (100 mg total) by mouth 2 (two) times daily. 05/31/19   Amin, Ankit Chirag, MD  eszopiclone (LUNESTA) 2 MG TABS tablet Take 2 mg by mouth at bedtime as needed for sleep (immediately before bedtime and HOLD FOR SEDATION).    [provider]  fenofibrate (TRICOR) 48 MG tablet Take 48 mg by mouth daily.    [provider]  ferrous sulfate 325 (65 FE) MG tablet Take 1 tablet (325 mg total) by mouth daily with breakfast. 11/23/18   Matilde Haymaker, MD  fluticasone [facility]) 50 MCG/ACT nasal spray Place 1 spray into both nostrils in the morning.    [provider]  gabapentin (NEURONTIN) 300 MG capsule Take 300 mg by mouth 3 (three) times daily.    [provider]  Glucagon, rDNA, (GLUCAGON EMERGENCY) 1 MG KIT Inject 1 mg into the vein as needed (for hypolycemia).    [provider]  guaiFENesin 200 MG tablet Take 200 mg by mouth every 6 (six) hours as needed (congestion).    [provider]  hydrOXYzine (ATARAX) 25 MG tablet Take 25 mg by mouth every 6 (six) hours as needed for itching or anxiety.    [provider]  insulin glargine (LANTUS SOLOSTAR) 100 UNIT/ML Solostar Pen Inject 22 Units into the skin daily. 08/27/22   Darliss Cheney, MD  insulin lispro (HUMALOG) 100 UNIT/ML KwikPen Inject 0-10 Units into the skin See admin instructions.  Inject 0-10 units into the skin three times a day and at bedtime, PER SLIDING SCALE:  BGL 151-200 = 2 units 201-250 = 4 units 251-300 = 6 units 301-350 =  8 units 351-400 = 10 units Patient taking differently: Inject 3-20 Units into the skin See admin instructions. Inject 3-20 units into the skin before meals and at bedtime, PER SLIDING SCALE: BGL 121-150 = 3 units; 151-200 = 4 units; 201-250 = 7 units; 251-300 = 11 units; 301-350 = 15 units; 351-400 = 20 units; >400 = CALL MD 01/31/22   Terrilee Croak, MD  ipratropium-albuterol  (DUONEB) 0.5-2.5 (3) MG/3ML SOLN Take 3 mLs by nebulization every 6 (six) hours as needed (for shortness of breath).    [provider]  lactulose (CHRONULAC) 10 GM/15ML solution Take 15 mLs (10 g total) by mouth 3 (three) times daily as needed for moderate constipation. Patient taking differently: Take 10 g by mouth every 8 (eight) hours as needed for moderate constipation. 01/31/22   Terrilee Croak, MD  levalbuterol (XOPENEX) 0.63 MG/3ML nebulizer solution Take 3 mLs (0.63 mg total) by nebulization every 6 (six) hours as needed for wheezing or shortness of breath. 01/31/22   Terrilee Croak, MD  Lidocaine 4 % PTCH Apply 1 patch topically See admin instructions. Apply 1 patch to both the right shoulder and the right stump at 9 AM daily- remove at 9 PM    [provider]  magnesium oxide (MAG-OX) 400 MG tablet Take 400 mg by mouth 2 (two) times daily.    [provider]  melatonin 3 MG TABS tablet Take 3 mg by mouth at bedtime.    [provider]  Menthol, Topical Analgesic, (BIOFREEZE) 4 % GEL Apply 1 Application topically See admin instructions. Apply to right leg/stump every 8 hours as needed for pain    [provider]  Meth-Hyo-M Bl-Na Phos-Ph Sal (URIBEL) 118 MG CAPS Take 1 capsule by mouth every 8 (eight) hours as needed (bladder pain).    [provider]  methenamine (MANDELAMINE) 1 g tablet Take 1 g by mouth daily.    [provider]  metoprolol tartrate (LOPRESSOR) 25 MG tablet Take 0.5 tablets (12.5 mg total) by mouth 2 (two) times daily. 07/18/19 09/06/22  Elodia Florence., MD  mirabegron ER (MYRBETRIQ) 50 MG TB24 tablet Take 50 mg by mouth daily.    [provider]  Multiple Vitamins-Minerals (MULTIVITAMIN WITH MINERALS) tablet Take 1 tablet by mouth daily.    [provider]  Multiple Vitamins-Minerals (PRESERVISION AREDS 2) CAPS Take 1 capsule by mouth in the morning and at bedtime.    [provider]   naloxone [facility]) 4 MG/0.1ML LIQD nasal spray kit Place 1 spray into the nose as needed (poorly responsive or turning blue).    [provider]  nystatin cream (MYCOSTATIN) Apply 1 Application topically See admin instructions. Apply to wound site every day shift and as needed, for wound care    [provider]  omeprazole (PRILOSEC) 20 MG capsule Take 20 mg by mouth daily.    [provider]  ondansetron (ZOFRAN) 4 MG tablet Take 4 mg by mouth every 8 (eight) hours as needed for nausea.    [provider]  oxybutynin (DITROPAN-XL) 10 MG 24 hr tablet Take 10 mg by mouth daily.    [provider]  oxyCODONE (OXY IR/ROXICODONE) 5 MG immediate release tablet Take 1 tablet (5 mg total) by mouth every 8 (eight) hours as needed  for severe pain (pain). 08/27/22   Darliss Cheney, MD  Oxycodone HCl 20 MG TABS Take 1 tablet (20 mg total) by mouth See admin instructions for 10 days. Take 20 mg by mouth three times a day and hold for sedation 08/27/22 09/06/22  Darliss Cheney, MD  OXYGEN Inhale 2 L/min into the lungs as needed (for shortness of breath- to maintain sats >90%).    [provider]  oxymetazoline (AFRIN 12 HOUR) 0.05 % nasal spray Place 1 spray into both nostrils every 12 (twelve) hours as needed for congestion.    [provider]  phenazopyridine (PYRIDIUM) 100 MG tablet Take 100 mg by mouth every 8 (eight) hours as needed for pain.    [provider]  Polyethyl Glycol-Propyl Glycol (LUBRICANT EYE DROPS) 0.4-0.3 % SOLN Place 2 drops into both eyes every 4 (four) hours as needed (dryness/itching).    [provider]  polyethylene glycol (MIRALAX / GLYCOLAX) 17 g packet Take 17 g by mouth daily as needed for mild constipation.    [provider]  pregabalin (LYRICA) 150 MG capsule Take 1 capsule (150 mg total) by mouth 2 (two) times daily. 07/07/21   Thurnell Lose, MD  promethazine (PHENERGAN) 12.5 MG tablet Take  12.5 mg by mouth every 6 (six) hours as needed for nausea or vomiting.    [provider]  rOPINIRole (REQUIP) 2 MG tablet Take 2 mg by mouth 2 (two) times daily.    [provider]  rosuvastatin (CRESTOR) 10 MG tablet Take 10 mg by mouth at bedtime.    [provider]  senna (SENOKOT) 8.6 MG TABS tablet Take 1 tablet by mouth at bedtime.    [provider]  SPIRIVA RESPIMAT 1.25 MCG/ACT AERS Inhale 1 each into the lungs in the morning.    [provider]  tamsulosin (FLOMAX) 0.4 MG CAPS capsule Take 0.8 mg by mouth daily. 11/26/13   [provider]  tiZANidine (ZANAFLEX) 4 MG tablet Take 4 mg by mouth every 6 (six) hours as needed for muscle spasms.    [provider]  TRULICITY [id number] SOPN Inject 0.75 mg into the skin every Wednesday.    [provider]      Allergies    Latex    Review of Systems   Review of Systems  Respiratory:  Positive for shortness of breath.   Cardiovascular:  Negative for chest pain.  Gastrointestinal:  Negative for abdominal pain, nausea and vomiting.  Genitourinary:  Negative for dysuria.  Musculoskeletal:  Positive for myalgias.  Skin:  Positive for wound.    Physical Exam Updated Vital Signs BP (!) 142/90   Pulse 97   Temp 98.8 F (37.1 C) (Oral)   Resp 20   SpO2 96%  Physical Exam Vitals and nursing note reviewed.  Constitutional:      General: He is not in acute distress.    Appearance: He is well-developed. He is obese.  HENT:     Head: Normocephalic and atraumatic.  Eyes:     Conjunctiva/sclera: Conjunctivae normal.  Cardiovascular:     Rate and Rhythm: Normal rate and regular rhythm.     Heart sounds: No murmur heard. Pulmonary:     Effort: No respiratory distress.  Abdominal:     Palpations: Abdomen is soft.     Tenderness: There is no abdominal tenderness.  Musculoskeletal:        General: No swelling.     Cervical back: Neck supple.  Comments:  Right AKA. See images of left lower extremity  Skin:    Capillary Refill: Capillary refill takes less than 2 seconds.     Comments: See attached images of left lower extremity  Neurological:     Mental Status: He is alert.  Psychiatric:        Mood and Affect: Mood normal.        ED Results / Procedures / Treatments   Labs (all labs ordered are listed, but only abnormal results are displayed) Labs Reviewed  CBC WITH DIFFERENTIAL/PLATELET - Abnormal; Notable for the following components:      Result Value   RBC 2.84 (*)    Hemoglobin 7.4 (*)    HCT 25.2 (*)    MCHC 29.4 (*)    RDW 16.1 (*)    All other components within normal limits  COMPREHENSIVE METABOLIC PANEL - Abnormal; Notable for the following components:   Glucose, Bld 280 (*)    BUN 31 (*)    Creatinine, Ser 2.16 (*)    Calcium 8.2 (*)    Albumin 2.8 (*)    AST 10 (*)    GFR, Estimated 34 (*)    All other components within normal limits  CULTURE, BLOOD (ROUTINE X 2)  CULTURE, BLOOD (ROUTINE X 2)  PROTIME-INR  BRAIN NATRIURETIC PEPTIDE  LACTIC ACID, PLASMA  LACTIC ACID, PLASMA  POC OCCULT BLOOD, ED  TYPE AND SCREEN  PREPARE RBC (CROSSMATCH)    EKG None  Radiology DG Tibia/Fibula Left  Result Date: 09/05/2022 CLINICAL DATA:  Left lower extremity swelling EXAM: LEFT TIBIA AND FIBULA - 2 VIEW; LEFT FOOT - COMPLETE 3 VIEW COMPARISON:  08/22/2022 FINDINGS: Left foot: Severe soft tissue swelling about the foot. No obvious soft tissue gas. There has been amputation of the phalanges of the first and second digit as well as the distal aspect of the first and second metatarsals. Residual sesamoid bone of the first ray. No obvious acute fracture or dislocation. No definite separate erosive changes. Views are oblique, limiting evaluation. In addition the extreme distal tip of the distal phalanx of fourth digit is clipped off the edge of the film. There is also been amputation of the distal phalanx and distal aspect of  the middle phalanx of third digit. Osteopenia. Stable erosive area involving the head of the third metatarsal. Tibia and fibula: Extensive soft tissue swelling particularly towards the ankle. No underlying fracture or dislocation. Osteopenia. Preserved adjacent joint spaces except for mild joint space loss of the medial compartment of the knee. IMPRESSION: Severe soft tissue swelling particularly of the ankle and foot. No obvious fracture or dislocation. Evaluation of the foot is somewhat limited due to positioning and technique. Surgical changes involving amputation of the phalanges of the first and second digit as well as the distal aspect of the third digit. Is also amputation of the distal aspect of the first and second metatarsals. Stable erosive changes involving the head of third metatarsal. If there is further concern of bone infection, additional evaluation with MRI or bone scan may be useful to further delineate. Electronically Signed   By: Jill Side M.D.   On: 09/05/2022 18:41   DG Foot Complete Left  Result Date: 09/05/2022 CLINICAL DATA:  Left lower extremity swelling EXAM: LEFT TIBIA AND FIBULA - 2 VIEW; LEFT FOOT - COMPLETE 3 VIEW COMPARISON:  08/22/2022 FINDINGS: Left foot: Severe soft tissue swelling about the foot. No obvious soft tissue gas. There has been amputation of the phalanges  of the first and second digit as well as the distal aspect of the first and second metatarsals. Residual sesamoid bone of the first ray. No obvious acute fracture or dislocation. No definite separate erosive changes. Views are oblique, limiting evaluation. In addition the extreme distal tip of the distal phalanx of fourth digit is clipped off the edge of the film. There is also been amputation of the distal phalanx and distal aspect of the middle phalanx of third digit. Osteopenia. Stable erosive area involving the head of the third metatarsal. Tibia and fibula: Extensive soft tissue swelling particularly  towards the ankle. No underlying fracture or dislocation. Osteopenia. Preserved adjacent joint spaces except for mild joint space loss of the medial compartment of the knee. IMPRESSION: Severe soft tissue swelling particularly of the ankle and foot. No obvious fracture or dislocation. Evaluation of the foot is somewhat limited due to positioning and technique. Surgical changes involving amputation of the phalanges of the first and second digit as well as the distal aspect of the third digit. Is also amputation of the distal aspect of the first and second metatarsals. Stable erosive changes involving the head of third metatarsal. If there is further concern of bone infection, additional evaluation with MRI or bone scan may be useful to further delineate. Electronically Signed   By: Jill Side M.D.   On: 09/05/2022 18:41   DG Chest 2 View  Result Date: 09/05/2022 CLINICAL DATA:  Shortness of breath EXAM: CHEST - 2 VIEW COMPARISON:  08/22/2022 FINDINGS: Heart size upper limits of normal. Mediastinal shadows are normal. There may be pulmonary venous hypertension but there is no frank edema. No consolidation or collapse. No effusion. IMPRESSION: Question pulmonary venous hypertension. No frank edema. No consolidation or collapse. Electronically Signed   By: Nelson Chimes M.D.   On: 09/05/2022 15:59    Procedures .Critical Care  Performed by: Dorothyann Peng, [name] Authorized by: Dorothyann Peng, [name]   Critical care provider statement:    Critical care time (minutes):  35   Critical care was necessary to treat or prevent imminent or life-threatening deterioration of the following conditions:  Circulatory failure   Critical care was time spent personally by me on the following activities:  Development of treatment plan with patient or surrogate, discussions with consultants, evaluation of patient's response to treatment, examination of patient, ordering and review of laboratory studies, ordering and  review of radiographic studies, ordering and performing treatments and interventions, pulse oximetry, re-evaluation of patient's condition and review of old charts   Care discussed with: admitting provider       Medications Ordered in ED Medications  vancomycin (VANCOREADY) IVPB 1750 mg/350 mL ( Intravenous Restarted 09/05/22 1910)  0.9 %  sodium chloride infusion (Manually program via Guardrails IV Fluids) (has no administration in time range)  morphine (PF) 4 MG/ML injection 4 mg (has no administration in time range)  morphine (PF) 4 MG/ML injection 4 mg (4 mg Intravenous Given 09/05/22 1727)  oxyCODONE-acetaminophen (PERCOCET/ROXICET) 5-325 MG per tablet 1 tablet (1 tablet Oral Given 09/05/22 1727)  ceFEPIme (MAXIPIME) 2 g in sodium chloride 0.9 % 100 mL IVPB (0 g Intravenous Stopped 09/05/22 1909)  lactated ringers bolus 500 mL (500 mLs Intravenous New Bag/Given 09/05/22 1732)    ED Course/ Medical Decision Making/ A&P Clinical Course as of 09/05/22 2002  Wed Sep 05, 2022  1641 CBC with Differential [LM]    Clinical Course User Index [LM] Dorothyann Peng, [name]  Medical Decision Making Amount and/or Complexity of Data Reviewed Labs: ordered. Decision-making details documented in ED Course. Radiology: ordered.  Risk Prescription drug management. Decision regarding hospitalization.   This patient presents to the ED for concern of shortness of breath with anemia, left lower leg pain swelling, this involves an extensive number of treatment options, and is a complaint that carries with it a high risk of complications and morbidity.  The differential diagnosis includes worsening cellulitis, acute blood loss anemia, iron deficiency anemia, osteomyelitis, and others   Co morbidities that complicate the patient evaluation  History of cellulitis in the left lower extremity causing previous sepsis   Additional history obtained:  Additional history  obtained from EMS External records from outside source obtained and reviewed including discharge summary from recent hospitalization   Lab Tests:  I Ordered, and personally interpreted labs.  The pertinent results include: Hemoglobin 7.4, creatinine 2.16 (below baseline), BNP 39.9, occult blood negative   Imaging Studies ordered:  I ordered imaging studies including chest x-ray, plain films of the left tib-fib and foot I independently visualized and interpreted imaging which showed  Question pulmonary venous hypertension. No frank edema. No  consolidation or collapse.   Severe soft tissue swelling particularly of the ankle and foot.    No obvious fracture or dislocation. Evaluation of the foot is  somewhat limited due to positioning and technique.    Surgical changes involving amputation of the phalanges of the first  and second digit as well as the distal aspect of the third digit. Is  also amputation of the distal aspect of the first and second  metatarsals.    Stable erosive changes involving the head of third metatarsal.   I agree with the radiologist interpretation   Consultations Obtained:  I requested consultation with the hospitalist,  and discussed lab and imaging findings as well as pertinent plan - they recommend: admission   Problem List / ED Course / Critical interventions / Medication management   I ordered medication including cefepime and vancomycin for cellulitis coverage, morphine and oxycodone for pain, PRBC for symptomatic anemia  Reevaluation of the patient after these medicines showed that the patient improved I have reviewed the patients home medicines and have made adjustments as needed   Social Determinants of Health:  Patient has medicaid for his primary insurance coverage   Test / Admission - Considered:  Patient will need admission due to symptomatic anemia of unknown etiology, need for continued IV antibiotics for left lower extremity  cellulitis        Final Clinical Impression(s) / ED Diagnoses Final diagnoses:  Symptomatic anemia  Cellulitis of other specified site  Shortness of breath    Rx / DC Orders ED Discharge Orders     None         Ronny Bacon 09/05/22 2155    Audley Hose, MD 09/20/22 [phone]

## 2022-09-06 ENCOUNTER — Encounter (HOSPITAL_COMMUNITY): Payer: Self-pay | Admitting: Family Medicine

## 2022-09-06 DIAGNOSIS — R0602 Shortness of breath: Secondary | ICD-10-CM | POA: Diagnosis not present

## 2022-09-06 DIAGNOSIS — L03818 Cellulitis of other sites: Secondary | ICD-10-CM | POA: Diagnosis not present

## 2022-09-06 DIAGNOSIS — D649 Anemia, unspecified: Secondary | ICD-10-CM | POA: Diagnosis not present

## 2022-09-06 LAB — RETICULOCYTES
Immature Retic Fract: 15.3 % (ref 2.3–15.9)
RBC.: 3.22 MIL/uL — ABNORMAL LOW (ref 4.22–5.81)
Retic Count, Absolute: 47 10*3/uL (ref 19.0–186.0)
Retic Ct Pct: 1.5 % (ref 0.4–3.1)

## 2022-09-06 LAB — TYPE AND SCREEN
ABO/RH(D): O POS
Antibody Screen: NEGATIVE
Unit division: 0

## 2022-09-06 LAB — GLUCOSE, CAPILLARY
Glucose-Capillary: 146 mg/dL — ABNORMAL HIGH (ref 70–99)
Glucose-Capillary: 241 mg/dL — ABNORMAL HIGH (ref 70–99)
Glucose-Capillary: 263 mg/dL — ABNORMAL HIGH (ref 70–99)
Glucose-Capillary: 157 mg/dL — ABNORMAL HIGH (ref 70–99)

## 2022-09-06 LAB — BPAM RBC
Blood Product Expiration Date: 202404202359
ISSUE DATE / TIME: 202403202001
Unit Type and Rh: 5100

## 2022-09-06 LAB — IRON AND TIBC
Iron: 20 ug/dL — ABNORMAL LOW (ref 45–182)
Saturation Ratios: 8 % — ABNORMAL LOW (ref 17.9–39.5)
TIBC: 238 ug/dL — ABNORMAL LOW (ref 250–450)
UIBC: 218 ug/dL

## 2022-09-06 LAB — CBC
HCT: 28.6 % — ABNORMAL LOW (ref 39.0–52.0)
Hemoglobin: 8.4 g/dL — ABNORMAL LOW (ref 13.0–17.0)
MCH: 25.9 pg — ABNORMAL LOW (ref 26.0–34.0)
MCHC: 29.4 g/dL — ABNORMAL LOW (ref 30.0–36.0)
MCV: 88.3 fL (ref 80.0–100.0)
Platelets: 225 10*3/uL (ref 150–400)
RBC: 3.24 MIL/uL — ABNORMAL LOW (ref 4.22–5.81)
RDW: 16 % — ABNORMAL HIGH (ref 11.5–15.5)
WBC: 6.7 10*3/uL (ref 4.0–10.5)
nRBC: 0 % (ref 0.0–0.2)

## 2022-09-06 LAB — BASIC METABOLIC PANEL
Anion gap: 8 (ref 5–15)
BUN: 27 mg/dL — ABNORMAL HIGH (ref 8–23)
CO2: 25 mmol/L (ref 22–32)
Calcium: 8.3 mg/dL — ABNORMAL LOW (ref 8.9–10.3)
Chloride: 102 mmol/L (ref 98–111)
Creatinine, Ser: 1.98 mg/dL — ABNORMAL HIGH (ref 0.61–1.24)
GFR, Estimated: 37 mL/min — ABNORMAL LOW (ref >60)
Glucose, Bld: 172 mg/dL — ABNORMAL HIGH (ref 70–99)
Potassium: 5.1 mmol/L (ref 3.5–5.1)
Sodium: 135 mmol/L (ref 135–145)

## 2022-09-06 LAB — FOLATE: Folate: 14 ng/mL (ref >5.9)

## 2022-09-06 LAB — VITAMIN B12: Vitamin B-12: 366 pg/mL (ref 180–914)

## 2022-09-06 LAB — TSH: TSH: 0.786 u[IU]/mL (ref 0.350–4.500)

## 2022-09-06 MED ORDER — POLYETHYLENE GLYCOL 3350 17 G PO PACK: 17.0000 g | PACK | Freq: Every day | ORAL | Status: DC | PRN

## 2022-09-06 MED ORDER — MELATONIN 5 MG PO TABS
10.0000 mg | ORAL_TABLET | Freq: Every evening | ORAL | Status: DC | PRN
  Administered 2022-09-06: 10 mg via ORAL
  Filled 2022-09-06: qty 2

## 2022-09-06 MED ORDER — OXYBUTYNIN CHLORIDE ER 10 MG PO TB24
10.0000 mg | ORAL_TABLET | Freq: Every day | ORAL | Status: DC
  Administered 2022-09-06 – 2022-09-07 (×2): 10 mg via ORAL
  Filled 2022-09-06 (×2): qty 1

## 2022-09-06 MED ORDER — FENOFIBRATE 54 MG PO TABS
54.0000 mg | ORAL_TABLET | Freq: Every day | ORAL | Status: DC
  Administered 2022-09-07: 54 mg via ORAL
  Filled 2022-09-06: qty 1

## 2022-09-06 MED ORDER — FUROSEMIDE 40 MG PO TABS
40.0000 mg | ORAL_TABLET | Freq: Every day | ORAL | Status: DC
  Administered 2022-09-06 – 2022-09-07 (×2): 40 mg via ORAL
  Filled 2022-09-06 (×2): qty 1

## 2022-09-06 MED ORDER — TAMSULOSIN HCL 0.4 MG PO CAPS
0.8000 mg | ORAL_CAPSULE | Freq: Every day | ORAL | Status: DC
  Administered 2022-09-06 – 2022-09-07 (×2): 0.8 mg via ORAL
  Filled 2022-09-06 (×2): qty 2

## 2022-09-06 MED ORDER — DOCUSATE SODIUM 100 MG PO CAPS
100.0000 mg | ORAL_CAPSULE | Freq: Two times a day (BID) | ORAL | Status: DC
  Administered 2022-09-06: 100 mg via ORAL
  Filled 2022-09-06: qty 1

## 2022-09-06 MED ORDER — MIRABEGRON ER 25 MG PO TB24
50.0000 mg | ORAL_TABLET | Freq: Every day | ORAL | Status: DC
  Administered 2022-09-06 – 2022-09-07 (×2): 50 mg via ORAL
  Filled 2022-09-06 (×2): qty 2

## 2022-09-06 MED ORDER — OXYCODONE HCL 5 MG PO TABS
5.0000 mg | ORAL_TABLET | Freq: Three times a day (TID) | ORAL | Status: DC | PRN
  Administered 2022-09-06: 5 mg via ORAL
  Filled 2022-09-06: qty 1

## 2022-09-06 MED ORDER — OXYCODONE HCL 5 MG PO TABS
5.0000 mg | ORAL_TABLET | Freq: Four times a day (QID) | ORAL | Status: DC | PRN
  Administered 2022-09-06 – 2022-09-07 (×3): 5 mg via ORAL
  Filled 2022-09-06 (×3): qty 1

## 2022-09-06 MED ORDER — SALINE SPRAY 0.65 % NA SOLN
1.0000 | NASAL | Status: DC | PRN
  Administered 2022-09-06 – 2022-09-07 (×2): 1 via NASAL
  Filled 2022-09-06: qty 44

## 2022-09-06 MED ORDER — LACTULOSE 10 GM/15ML PO SOLN: 10.0000 g | Freq: Three times a day (TID) | ORAL | Status: DC | PRN

## 2022-09-06 MED ORDER — MEDIHONEY WOUND/BURN DRESSING EX PSTE
1.0000 | PASTE | Freq: Every day | CUTANEOUS | Status: DC
  Administered 2022-09-06 – 2022-09-07 (×2): 1 via TOPICAL
  Filled 2022-09-06: qty 44

## 2022-09-06 MED ORDER — METOPROLOL TARTRATE 12.5 MG HALF TABLET
12.5000 mg | ORAL_TABLET | Freq: Two times a day (BID) | ORAL | Status: DC
  Administered 2022-09-06 – 2022-09-07 (×3): 12.5 mg via ORAL
  Filled 2022-09-06 (×3): qty 1

## 2022-09-06 MED ORDER — HYDROXYZINE HCL 25 MG PO TABS
25.0000 mg | ORAL_TABLET | Freq: Four times a day (QID) | ORAL | Status: DC | PRN
  Administered 2022-09-06: 25 mg via ORAL
  Filled 2022-09-06: qty 1

## 2022-09-06 MED ORDER — OXYCODONE HCL 5 MG PO TABS: 5.0000 mg | ORAL_TABLET | Freq: Once | ORAL | Status: DC

## 2022-09-06 MED ORDER — HYDROMORPHONE HCL 1 MG/ML IJ SOLN
1.0000 mg | INTRAMUSCULAR | Status: DC | PRN
  Administered 2022-09-06 – 2022-09-07 (×5): 1 mg via INTRAVENOUS
  Filled 2022-09-06 (×6): qty 1

## 2022-09-06 MED ORDER — ACETAMINOPHEN 325 MG PO TABS
650.0000 mg | ORAL_TABLET | ORAL | Status: DC | PRN
  Administered 2022-09-06: 650 mg via ORAL
  Filled 2022-09-06 (×2): qty 2

## 2022-09-06 MED ORDER — LORAZEPAM 0.5 MG PO TABS
0.5000 mg | ORAL_TABLET | ORAL | Status: AC
  Administered 2022-09-06: 0.5 mg via ORAL
  Filled 2022-09-06: qty 1

## 2022-09-06 MED ORDER — HYDRALAZINE HCL 20 MG/ML IJ SOLN: 10.0000 mg | Freq: Four times a day (QID) | INTRAMUSCULAR | Status: DC | PRN

## 2022-09-06 MED ORDER — OXYMETAZOLINE HCL 0.05 % NA SOLN
1.0000 | Freq: Two times a day (BID) | NASAL | Status: DC | PRN
  Administered 2022-09-06: 1 via NASAL
  Filled 2022-09-06: qty 15

## 2022-09-06 MED ORDER — FLUTICASONE PROPIONATE 50 MCG/ACT NA SUSP
1.0000 | Freq: Every day | NASAL | Status: DC
  Administered 2022-09-07: 1 via NASAL
  Filled 2022-09-06: qty 16

## 2022-09-06 MED ORDER — MAGNESIUM OXIDE -MG SUPPLEMENT 400 (240 MG) MG PO TABS
400.0000 mg | ORAL_TABLET | Freq: Two times a day (BID) | ORAL | Status: DC
  Administered 2022-09-06 – 2022-09-07 (×2): 400 mg via ORAL
  Filled 2022-09-06 (×2): qty 1

## 2022-09-06 MED ORDER — INSULIN GLARGINE-YFGN 100 UNIT/ML ~~LOC~~ SOLN
15.0000 [IU] | Freq: Every day | SUBCUTANEOUS | Status: DC
  Administered 2022-09-07: 15 [IU] via SUBCUTANEOUS
  Filled 2022-09-06: qty 0.15

## 2022-09-06 MED ORDER — LORAZEPAM 0.5 MG PO TABS
0.5000 mg | ORAL_TABLET | Freq: Four times a day (QID) | ORAL | Status: DC | PRN
  Administered 2022-09-06 – 2022-09-07 (×4): 0.5 mg via ORAL
  Filled 2022-09-06 (×4): qty 1

## 2022-09-06 NOTE — Progress Notes (Signed)
Patient refused this RN to clean, measure and apply foam dressings to 2 areas on posterior left thigh. Able to measure area on left ankle.

## 2022-09-06 NOTE — Progress Notes (Signed)
Patient refused scan

## 2022-09-06 NOTE — Consult Note (Addendum)
[place] Nurse Consult Note: Reason for Consult: Consult requested for left leg.  Pt is familiar to [facility] team from recent admission on 3/6. X-ray does not indicate osteomyelitis.  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 Full thickness wound to left anterior lower leg, near ankle is the largest in size, 90% yellow and moist, 10% red, 5X3X.2cm, according to bedside nursing wound flow sheet. Dressing procedure/placement/frequency: Topical treatment orders provided for bedside nurses to perform as follows to assist with removal of nonviable tissue and promote healing: Apply Medihoney to left anterior ankle wound Q day, then cover this and the rest of the leg with xeroform gauze, ABD pads, and kerlex, beginning just behind the toes to below the knee.  Please re-consult if further assistance is needed.  Thank-you,  Julien Girt MSN, [place], [place], [place], [place]

## 2022-09-06 NOTE — Progress Notes (Addendum)
Patient refused to place telemetry on and verbalize that he does not need it. Patient yelled at RN when RN answered his question about pain medication. He would like to have stronger pain medication and RN explain his concern was heard by MD on call and wait until they decide to order anything else.

## 2022-09-06 NOTE — Progress Notes (Signed)
Patient does not want his wound wrapped up. RN performed wound care as ordered.

## 2022-09-06 NOTE — TOC Initial Note (Addendum)
Transition of Care [facility]) - Initial/Assessment Note    Patient Details  Name: Gregory [name] MRN: [id number] Date of Birth: 1960/06/29  Transition of Care [facility]) CM/SW Contact:    Vassie Moselle, LCSW Phone Number: 09/06/2022, 1:50 PM  Clinical Narrative:                 Pt coming from [facility] where he is a LTC resident. Pt on 2L O2 at baseline.  Met with pt and confirmed plan to return at discharge. [facility] agreeable to accept pt back. Will need PTAR transportation at discharge.   Expected Discharge Plan: Long Term Nursing Home Barriers to Discharge: Continued Medical Work up   Patient Goals and CMS Choice Patient states their goals for this hospitalization and ongoing recovery are:: to return to SNF CMS Medicare.gov Compare Post Acute Care list provided to:: Patient Choice offered to / list presented to : Patient [place] ownership interest in [facility].provided to:: Patient    Expected Discharge Plan and Services In-house Referral: Clinical Social Work Discharge Planning Services: NA Post Acute Care Choice: [place], Nursing Home Living arrangements for the past 2 months: [place]                 DME Arranged: N/A DME Agency: NA                  Prior Living Arrangements/Services Living arrangements for the past 2 months: [place] Lives with:: Facility Resident Patient language and need for interpreter reviewed:: Yes Do you feel safe going back to the place where you live?: Yes      Need for Family Participation in Patient Care: No (Comment) Care giver support system in place?: Yes (comment) Current home services: DME Criminal Activity/Legal Involvement Pertinent to Current Situation/Hospitalization: No - Comment as needed  Activities of Daily Living Home Assistive Devices/Equipment: CBG Meter, Eyeglasses, Wheelchair ADL Screening (condition at time of admission) Patient's cognitive  ability adequate to safely complete daily activities?: Yes Is the patient deaf or have difficulty hearing?: No Does the patient have difficulty seeing, even when wearing glasses/contacts?: No Does the patient have difficulty concentrating, remembering, or making decisions?: No Patient able to express need for assistance with ADLs?: Yes Does the patient have difficulty dressing or bathing?: Yes Independently performs ADLs?: No Communication: Independent Dressing (OT): Needs assistance Is this a change from baseline?: Pre-admission baseline Grooming: Needs assistance Is this a change from baseline?: Pre-admission baseline Feeding: Independent Bathing: Needs assistance Is this a change from baseline?: Pre-admission baseline Toileting: Needs assistance Is this a change from baseline?: Pre-admission baseline In/Out Bed: Needs assistance Is this a change from baseline?: Pre-admission baseline Walks in Home: Dependent Is this a change from baseline?: Pre-admission baseline Does the patient have difficulty walking or climbing stairs?: Yes Weakness of Legs: Left Weakness of Arms/Hands: Right (R AKA)  Permission Sought/Granted Permission sought to share information with : Investment banker, corporate granted to share info w AGENCY: [facility]        Emotional Assessment Appearance:: Appears stated age Attitude/Demeanor/Rapport: Engaged Affect (typically observed): Accepting Orientation: : Oriented to Self, Oriented to Place, Oriented to  Time, Oriented to Situation Alcohol / Substance Use: Not Applicable Psych Involvement: No (comment)  Admission diagnosis:  Shortness of breath [R06.02] Symptomatic anemia [D64.9] Cellulitis of other specified site [L03.818] Acute anemia [D64.9] Patient Active Problem List   Diagnosis Date Noted  Acute anemia 09/05/2022   Chronic hypoxic respiratory failure ([place]) 09/05/2022   Respiratory failure ([place]) 08/22/2022    Pressure injury of skin 08/22/2022   Influenza A 08/22/2022   Sepsis ([place]) 08/22/2022   Pneumonia 05/01/2022   Right upper lobe pneumonia 09/05/2021   Hyponatremia 09/05/2021   Anxiety 09/05/2021   BPH (benign prostatic hyperplasia) 09/05/2021   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   Asymptomatic bacteriuria 06/30/2021   Normocytic anemia 06/29/2021   Pain of amputation stump of right lower extremity ([place]) 03/15/2021   Melanoma of cheek ([place]) 10/15/2019   Acute hypoxic respiratory failure ([place]) 07/11/2019   Acute respiratory disease due to COVID-19 virus 07/11/2019   Wound infection 06/22/2019   Abscess 06/22/2019   Stage 3b chronic kidney disease (CKD) ([place]) 05/23/2019   Tobacco use 05/23/2019   Sepsis due to cellulitis ([place]) 05/23/2019   S/P AKA (above knee amputation) unilateral, right ([place]) 12/05/2018   Amputation stump infection ([place])    Lymphedema of left leg    Diabetic polyneuropathy associated with type 2 diabetes mellitus ([place])    Above knee amputation of right lower extremity ([place]) 10/22/2018   Dehiscence of amputation stump ([place])    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) 06/03/2018   Idiopathic chronic venous hypertension of left lower extremity with ulcer and inflammation ([place]) 11/14/2017   Great toe amputation status, left 11/14/2017   Enlarged lymph node in neck 07/18/2017   Malignant melanoma of face ([place]) 04/16/2017   Osteomyelitis ([place]) 03/25/2017   Skin cancer 03/25/2017   Depression 11/05/2016   Diabetic ulcer of toe of left foot ([place]) 10/02/2016   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   Chronic pain 09/27/2016   DM2 (diabetes  mellitus, type 2) ([place]) 06/06/2016   GERD without esophagitis 05/11/2016   Hypertensive heart disease with congestive heart failure ([place]) 12/15/2015   Spinal stenosis in cervical region 12/15/2015   Type II diabetes mellitus with neurological manifestations ([place]) 12/15/2015   Chronic diastolic HF (heart failure) ([place])    Pulmonary hypertension ([place])    HTN (hypertension)    HLD (hyperlipidemia)    Urinary retention    Diskitis    Foot drop, bilateral    Sepsis with acute renal failure without septic shock ([place])    COPD (chronic obstructive pulmonary disease) ([place]) 10/03/2015   Acute osteomyelitis of left foot ([place]) 10/03/2015   MRSA bacteremia 08/13/2015   Chronic low back pain 08/13/2015   Chronic multifocal osteomyelitis, multiple sites ([place]) 08/13/2015   Community acquired pneumonia 08/13/2015   PCP:  [name], MD Pharmacy:   [place], [place] - [address] [place] [address] Phone: [phone] Fax: [phone]     Social Determinants of Health (SDOH) Social History: SDOH Screenings   Food Insecurity: No Food Insecurity (09/06/2022)  Housing: Low Risk  (05/02/2022)  Transportation Needs: No Transportation Needs (09/06/2022)  Utilities: Not At Risk (09/06/2022)  Depression (PHQ2-9): Low Risk  (03/08/2021)  Tobacco Use: High Risk (09/06/2022)   SDOH Interventions:     Readmission Risk Interventions    09/06/2022    1:48 PM 08/27/2022  12:46 PM 05/03/2022   11:53 AM  Readmission Risk Prevention Plan  Transportation Screening Complete Complete Complete  Medication Review Press photographer) Complete Complete Complete  PCP or Specialist appointment within 3-5 days of discharge Complete Complete Complete  HRI or Home Care Consult Complete Complete Complete  SW Recovery Care/Counseling Consult Complete Complete Complete  Palliative Care Screening Not Applicable Not Applicable Not Applicable  Skilled Nursing  Facility Complete Complete Complete

## 2022-09-06 NOTE — Progress Notes (Signed)
Patient admitted to room 1506, alert and oriented, declines to wear telemetry despite education provided secure message sent to NP [facility].

## 2022-09-06 NOTE — Progress Notes (Signed)
Lab tech on unit to obtain 2nd blood culture and s/p blood transfusion labs, patient uncooperative, blood clotted per tech and lab tech will return in about an hour.

## 2022-09-06 NOTE — Progress Notes (Signed)
[name]                                                                              Gregory [name], is a 62 y.o. male, DOB - February 05, 1961, [id number] Admit date - 09/05/2022    Outpatient Primary MD for the patient is Gregory Marble, MD  LOS - 1  days  Chief Complaint  Patient presents with   Leg Swelling   Abnormal Lab       Brief summary   Patient is a 62 year old male with IDDM, COPD with chronic hypoxic respiratory failure on 2 L, CKD stage IIIb, chronic diastolic CHF, 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 He was just hospitalized from 3/6-3/11 with severe sepsis due to left LE cellulitis and multifocal pneumonia and Stretococcus group G bacteremia. He was treated initially with Vancomycin,Cefepime and azithromycin. He was then narrowed to Rocephin and azithromycin based on susceptibilities. Subsequently he was discharged on 2 weeks of Augmentin. He was also treated for acute on chronic hypoxic respiratory failure due to COPD exacerbation from both multifocal pneumonia and influenza A treated with Tamiflu and antibiotics as above.   Patient now presented with increasing shortness of breath since the night before the admission, at rest.  Wheelchair-bound at baseline.  Hemoglobin of 7.4 from baseline of 8-9.  Reported no dark stools, not on anticoagulation, declined to have any GI workup done.  Patient was admitted for further workup.  Assessment & Plan    Principal Problem:   Acute on chronic anemia, normocytic -Baseline hemoglobin 8-9, has chronic anemia due to CKD -Patient refuses any scopes if warranted, agreed for 1 unit packed RBCs -Patient received 1 unit packed RBCs overnight, however now refuses to have labs done post transfusion H&H check  Active problems Chronic hypoxic respiratory failure (HCC) -Continue O2 2 L via [place], dyspnea symptoms likely from acute  anemia -Currently no wheezing   Stage 3b chronic kidney disease (CKD) (HCC) -Baseline creatinine 1.7-2.2 with presenting Cr of 2.16 -has significant edema, lymphedema in the left lower extremity with cellulitis, will place on Lasix 40 mg p.o. daily x 2 days, then continue home dose of 20 mg daily.   Cellulitis of left lower extremity -recently discharged last week for the same and initially treated with Vancomycin and Cefepime. Then narrowed to Rocephin and ultimately discharged with 2 weeks of Augmentin based on susceptibilities from streptococcus group G bacteremia.  -Continue IV vancomycin and cefepime, appears to have failed Augmentin outpatient.  -pending blood cultures -wound care consulted   DM2 (diabetes mellitus, type 2) ([place])  CBG (last 3)  Recent Labs    09/05/22 2224 09/06/22 0809 09/06/22 1137  GLUCAP 134* 146* 241*  -Resume home dose of 15 units of Semglee daily, continue sliding scale insulin while inpatient    HTN (hypertension) -BP uncontrolled, resume Lasix, metoprolol, added IV hydralazine as needed with parameters   Chronic diastolic HF (heart failure) (HCC) -Placed on Lasix 40 mg daily for 2 days, then will resume home dose of 20 mg daily     RN Pressure  Injury Documentation: Pressure Injury 09/05/22 Thigh Left;Posterior;Proximal Stage 2 -  Partial thickness loss of dermis presenting as a shallow open injury with a red, pink wound bed without slough. (Active)  09/05/22 2255  Location: Thigh  Location Orientation: Left;Posterior;Proximal  Staging: Stage 2 -  Partial thickness loss of dermis presenting as a shallow open injury with a red, pink wound bed without slough.  Wound Description (Comments):   Present on Admission: Yes     Pressure Injury 09/05/22 Thigh Distal;Left;Posterior Stage 2 -  Partial thickness loss of dermis presenting as a shallow open injury with a red, pink wound bed without slough. (Active)  09/05/22 2250  Location: Thigh  Location  Orientation: Distal;Left;Posterior  Staging: Stage 2 -  Partial thickness loss of dermis presenting as a shallow open injury with a red, pink wound bed without slough.  Wound Description (Comments):   Present on Admission: Yes     Pressure Injury 09/05/22 Ankle Anterior;Left Stage 2 -  Partial thickness loss of dermis presenting as a shallow open injury with a red, pink wound bed without slough. full thickness wound, NOT a pressure injury (Active)  09/05/22 2250  Location: Ankle  Location Orientation: Anterior;Left  Staging: Stage 2 -  Partial thickness loss of dermis presenting as a shallow open injury with a red, pink wound bed without slough.  Wound Description (Comments): full thickness wound, NOT a pressure injury  Present on Admission: Yes  Dressing Type None 09/06/22 0500   Obesity Estimated body mass index is 38.71 kg/m as calculated from the following:   Height as of this encounter: 5\' 7"  (1.702 m).   Weight as of this encounter: 112.1 kg.  Code Status: Full code DVT Prophylaxis:     Level of Care: Level of care: Telemetry Family Communication: Updated patient Disposition Plan:      Remains inpatient appropriate:     Procedures:    Consultants:    Antimicrobials:   Anti-infectives (From admission, onward)    Start     Dose/Rate Route Frequency Ordered Stop   09/07/22 1800  vancomycin (VANCOREADY) IVPB 1750 mg/350 mL        1,750 mg 175 mL/hr over 120 Minutes Intravenous Every 48 hours 09/05/22 2118     09/06/22 0600  ceFEPIme (MAXIPIME) 2 g in sodium chloride 0.9 % 100 mL IVPB        2 g 200 mL/hr over 30 Minutes Intravenous Every 12 hours 09/05/22 2116     09/05/22 1715  vancomycin (VANCOREADY) IVPB 1750 mg/350 mL        1,750 mg 175 mL/hr over 120 Minutes Intravenous  Once 09/05/22 1657 09/05/22 2001   09/05/22 1700  ceFEPIme (MAXIPIME) 2 g in sodium chloride 0.9 % 100 mL IVPB        2 g 200 mL/hr over 30 Minutes Intravenous  Once 09/05/22 1656 09/05/22  1909          Medications  furosemide  40 mg Oral Daily   insulin aspart  0-15 Units Subcutaneous TID PC & HS   insulin glargine-yfgn  10 Units Subcutaneous Daily   leptospermum manuka honey  1 Application Topical Daily   nicotine  21 mg Transdermal Daily      Subjective:   Raymie Penta was seen and examined today.  Refusing labs this morning.  Asking something stronger for the pain in the left leg and anxiety.  Still has significant edema/lymphedema and redness to the left lower leg.  No dizziness, chest pain,  shortness of breath, nausea vomiting or abdominal pain.  No fevers.   Objective:   Vitals:   09/06/22 0208 09/06/22 0459 09/06/22 0611 09/06/22 1139  BP: (!) 147/76  (!) 139/59 (!) 160/90  Pulse: 82  76 76  Resp: 17  18 18   Temp: 98.3 F (36.8 C)  99.2 F (37.3 C) 97.8 F (36.6 C)  TempSrc: Oral  Oral Oral  SpO2: 94%  93% 97%  Weight:  112.1 kg    Height:  5\' 7"  (1.702 m)      Intake/Output Summary (Last 24 hours) at 09/06/2022 1252 Last data filed at 09/06/2022 [id number] Gross per 24 hour  Intake 520 ml  Output 550 ml  Net -30 ml     Wt Readings from Last 3 Encounters:  09/06/22 112.1 kg  08/27/22 86 kg  05/01/22 111.1 kg     Exam General: Alert and oriented x 3, NAD Cardiovascular: S1 S2 auscultated,  RRR Respiratory: Clear to auscultation bilaterally, no wheezing Gastrointestinal: Soft, nontender, nondistended, + bowel sounds Ext: no pedal edema bilaterally, right AKA Neuro: no new FND's Skin: Chronic venous stasis changes with lymphedema to the entire left lower extremity and numerous nonhealing ulcers.  Erythema up to the medial thigh.  Amputation of the 1st-3rd digit. Psych: anxious     Data Reviewed:  I have personally reviewed following labs    CBC Lab Results  Component Value Date   WBC 7.4 09/05/2022   RBC 2.84 (L) 09/05/2022   HGB 7.4 (L) 09/05/2022   HCT 25.2 (L) 09/05/2022   MCV 88.7 09/05/2022   MCH 26.1 09/05/2022   PLT 279  09/05/2022   MCHC 29.4 (L) 09/05/2022   RDW 16.1 (H) 09/05/2022   LYMPHSABS 1.5 09/05/2022   MONOABS 0.7 09/05/2022   EOSABS 0.1 09/05/2022   BASOSABS 0.1 [id number]     Last metabolic panel Lab Results  Component Value Date   NA 135 09/05/2022   K 5.0 09/05/2022   CL 103 09/05/2022   CO2 26 09/05/2022   BUN 31 (H) 09/05/2022   CREATININE 2.16 (H) 09/05/2022   GLUCOSE 280 (H) 09/05/2022   GFRNONAA 34 (L) 09/05/2022   GFRAA 42 (L) 02/24/2020   CALCIUM 8.2 (L) 09/05/2022   PHOS 4.1 05/03/2022   PROT 6.9 09/05/2022   ALBUMIN 2.8 (L) 09/05/2022   BILITOT 0.4 09/05/2022   ALKPHOS 61 09/05/2022   AST 10 (L) 09/05/2022   ALT 17 09/05/2022   ANIONGAP 6 09/05/2022    CBG (last 3)  Recent Labs    09/05/22 2224 09/06/22 0809 09/06/22 1137  GLUCAP 134* 146* 241*      Coagulation Profile: Recent Labs  Lab 09/05/22 1635  INR 1.0     Radiology Studies: I have personally reviewed the imaging studies  DG Tibia/Fibula Left  Result Date: 09/05/2022 CLINICAL DATA:  Left lower extremity swelling EXAM: LEFT TIBIA AND FIBULA - 2 VIEW; LEFT FOOT - COMPLETE 3 VIEW COMPARISON:  08/22/2022 FINDINGS: Left foot: Severe soft tissue swelling about the foot. No obvious soft tissue gas. There has been amputation of the phalanges of the first and second digit as well as the distal aspect of the first and second metatarsals. Residual sesamoid bone of the first ray. No obvious acute fracture or dislocation. No definite separate erosive changes. Views are oblique, limiting evaluation. In addition the extreme distal tip of the distal phalanx of fourth digit is clipped off the edge of the film. There is also been amputation of  the distal phalanx and distal aspect of the middle phalanx of third digit. Osteopenia. Stable erosive area involving the head of the third metatarsal. Tibia and fibula: Extensive soft tissue swelling particularly towards the ankle. No underlying fracture or dislocation.  Osteopenia. Preserved adjacent joint spaces except for mild joint space loss of the medial compartment of the knee. IMPRESSION: Severe soft tissue swelling particularly of the ankle and foot. No obvious fracture or dislocation. Evaluation of the foot is somewhat limited due to positioning and technique. Surgical changes involving amputation of the phalanges of the first and second digit as well as the distal aspect of the third digit. Is also amputation of the distal aspect of the first and second metatarsals. Stable erosive changes involving the head of third metatarsal. If there is further concern of bone infection, additional evaluation with MRI or bone scan may be useful to further delineate. Electronically Signed   By: Jill Side M.D.   On: 09/05/2022 18:41   DG Foot Complete Left  Result Date: 09/05/2022 CLINICAL DATA:  Left lower extremity swelling EXAM: LEFT TIBIA AND FIBULA - 2 VIEW; LEFT FOOT - COMPLETE 3 VIEW COMPARISON:  08/22/2022 FINDINGS: Left foot: Severe soft tissue swelling about the foot. No obvious soft tissue gas. There has been amputation of the phalanges of the first and second digit as well as the distal aspect of the first and second metatarsals. Residual sesamoid bone of the first ray. No obvious acute fracture or dislocation. No definite separate erosive changes. Views are oblique, limiting evaluation. In addition the extreme distal tip of the distal phalanx of fourth digit is clipped off the edge of the film. There is also been amputation of the distal phalanx and distal aspect of the middle phalanx of third digit. Osteopenia. Stable erosive area involving the head of the third metatarsal. Tibia and fibula: Extensive soft tissue swelling particularly towards the ankle. No underlying fracture or dislocation. Osteopenia. Preserved adjacent joint spaces except for mild joint space loss of the medial compartment of the knee. IMPRESSION: Severe soft tissue swelling particularly of the  ankle and foot. No obvious fracture or dislocation. Evaluation of the foot is somewhat limited due to positioning and technique. Surgical changes involving amputation of the phalanges of the first and second digit as well as the distal aspect of the third digit. Is also amputation of the distal aspect of the first and second metatarsals. Stable erosive changes involving the head of third metatarsal. If there is further concern of bone infection, additional evaluation with MRI or bone scan may be useful to further delineate. Electronically Signed   By: Jill Side M.D.   On: 09/05/2022 18:41   DG Chest 2 View  Result Date: 09/05/2022 CLINICAL DATA:  Shortness of breath EXAM: CHEST - 2 VIEW COMPARISON:  08/22/2022 FINDINGS: Heart size upper limits of normal. Mediastinal shadows are normal. There may be pulmonary venous hypertension but there is no frank edema. No consolidation or collapse. No effusion. IMPRESSION: Question pulmonary venous hypertension. No frank edema. No consolidation or collapse. Electronically Signed   By: Nelson Chimes M.D.   On: 09/05/2022 15:59       Husayn Reim M.D. [name] 09/06/2022, 12:52 PM  Available via Epic secure chat 7am-7pm After 7 pm, please refer to night coverage provider listed on amion.

## 2022-09-06 NOTE — Progress Notes (Signed)
Patient continues to refuse application of telemetry.

## 2022-09-06 NOTE — [place] FL2 (Signed)
[place] MEDICAID FL2 LEVEL OF CARE FORM     IDENTIFICATION  Patient Name: Gregory [name] Birthdate: 1960/08/06 Sex: male Admission Date (Current Location): 09/05/2022  [facility] and [place] Number:  Herbalist and Address:  [facility],  [place], [place]      Provider Number: [id number]  Attending Physician Name and Address:  Mendel Corning, MD  Relative Name and Phone Number:       Current Level of Care: Hospital Recommended Level of Care: 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   Asymptomatic bacteriuria 06/30/2021   Normocytic anemia 06/29/2021   Pain of amputation stump of right lower extremity ([place]) 03/15/2021   Melanoma of cheek ([place]) 10/15/2019   Acute hypoxic respiratory failure ([place]) 07/11/2019   Acute respiratory disease due to COVID-19 virus 07/11/2019   Wound infection 06/22/2019   Abscess 06/22/2019   Stage 3b chronic kidney disease (CKD) ([place]) 05/23/2019   Tobacco use 05/23/2019   Sepsis due to cellulitis ([place]) 05/23/2019   S/P AKA (above knee amputation) unilateral, right ([place]) 12/05/2018   Amputation stump infection ([place])    Lymphedema of left leg    Diabetic polyneuropathy associated with type 2 diabetes mellitus ([place])    Above knee amputation of right lower extremity ([place]) 10/22/2018   Dehiscence of amputation stump ([place])    Chronic infection  of amputation stump ([place]) 08/12/2018   Candidiasis 08/12/2018   Necrosis of toe ([place]) 07/08/2018   Amputated toe of left foot ([place]) 06/18/2018   Streptococcal infection    Pressure injury, stage 3 ([place]) 06/06/2018   Cutaneous abscess of left foot    Moderate protein-calorie malnutrition ([place])    Cellulitis of left lower extremity    Septic arthritis ([place]) 06/03/2018   Idiopathic chronic venous hypertension of left lower extremity with ulcer and inflammation ([place]) 11/14/2017   Great toe amputation status, left 11/14/2017   Enlarged lymph node in neck 07/18/2017   Malignant melanoma of face ([place]) 04/16/2017   Osteomyelitis ([place]) 03/25/2017   Skin cancer 03/25/2017   Depression 11/05/2016   Diabetic ulcer of toe of left foot ([place]) 10/02/2016   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   Chronic pain 09/27/2016   DM2 (diabetes mellitus, type 2) ([place]) 06/06/2016   GERD without esophagitis 05/11/2016   Hypertensive heart disease with congestive heart failure ([place]) 12/15/2015   Spinal stenosis in cervical region 12/15/2015   Type II diabetes mellitus with neurological manifestations ([place]) 12/15/2015   Chronic diastolic HF (heart failure) (HCC)    Pulmonary hypertension (HCC)    HTN (hypertension)    HLD (hyperlipidemia)    Urinary retention    Diskitis    Foot drop, bilateral    Sepsis with acute renal failure without septic shock (HCC)    COPD (chronic obstructive pulmonary disease) ([place]) 10/03/2015   Acute osteomyelitis of left foot ([place]) 10/03/2015   MRSA bacteremia 08/13/2015   Chronic low  back pain 08/13/2015   Chronic multifocal osteomyelitis, multiple sites ([place]) 08/13/2015   Community acquired pneumonia 08/13/2015    Orientation RESPIRATION BLADDER Height & Weight     Self, Time, Situation, Place  O2 Incontinent Weight: 247 lb 2.2 oz (112.1 kg) Height:  5\' 7"  (170.2 cm)  BEHAVIORAL SYMPTOMS/MOOD NEUROLOGICAL BOWEL NUTRITION STATUS      Incontinent Diet (See DC summary)  AMBULATORY  STATUS COMMUNICATION OF NEEDS Skin   Extensive Assist Verbally PU Stage and Appropriate Care (Stage II on ankle)   PU Stage 2 Dressing: Daily                   Personal Care Assistance Level of Assistance  Bathing, Feeding, Dressing Bathing Assistance: Maximum assistance Feeding assistance: Limited assistance Dressing Assistance: Limited assistance     Functional Limitations Info  Sight, Hearing, Speech Sight Info: Impaired Hearing Info: Adequate Speech Info: Adequate    SPECIAL CARE FACTORS FREQUENCY  PT (By licensed PT), OT (By licensed OT)     PT Frequency: 5x/wk OT Frequency: 5x/wk            Contractures Contractures Info: Not present    Additional Factors Info  Code Status, Allergies, Isolation Precautions Code Status Info: FULL Allergies Info: Latex     Isolation Precautions Info: OMDRO, MRSA     Current Medications (09/06/2022):  This is the current hospital active medication list Current Facility-Administered Medications  Medication Dose Route Frequency Provider Last Rate Last Admin   acetaminophen (TYLENOL) tablet 650 mg  650 mg Oral Q4H PRN Raenette Rover, NP   650 mg at 09/06/22 [id number]   ceFEPIme (MAXIPIME) 2 g in sodium chloride 0.9 % 100 mL IVPB  2 g Intravenous Q12H Lenis Noon, RPH 200 mL/hr at 09/06/22 0502 2 g at 09/06/22 0502   docusate sodium (COLACE) capsule 100 mg  100 mg Oral BID Rai, Vernelle Emerald, MD       Derrill Memo ON 09/07/2022] fenofibrate tablet 54 mg  54 mg Oral Daily Rai, Ripudeep K, MD       [START ON 09/07/2022] fluticasone (FLONASE) 50 MCG/ACT nasal spray 1 spray  1 spray Each Nare Daily Rai, Ripudeep K, MD       furosemide (LASIX) tablet 40 mg  40 mg Oral Daily Rai, Ripudeep K, MD   40 mg at 09/06/22 1340   hydrALAZINE (APRESOLINE) injection 10 mg  10 mg Intravenous Q6H PRN Rai, Ripudeep K, MD       HYDROmorphone (DILAUDID) injection 1 mg  1 mg Intravenous Q4H PRN Rai, Ripudeep K, MD   1 mg at 09/06/22 1047   insulin aspart (novoLOG)  injection 0-15 Units  0-15 Units Subcutaneous TID PC & HS Tu, Ching T, DO   5 Units at 09/06/22 1217   [START ON 09/07/2022] insulin glargine-yfgn (SEMGLEE) injection 15 Units  15 Units Subcutaneous Daily Rai, Ripudeep K, MD       lactulose (CHRONULAC) 10 GM/15ML solution 10 g  10 g Oral TID PRN Rai, Ripudeep K, MD       leptospermum manuka honey (MEDIHONEY) paste 1 Application  1 Application Topical Daily Rai, Ripudeep K, MD   1 Application at [id number]   LORazepam (ATIVAN) tablet 0.5 mg  0.5 mg Oral Q6H PRN Rai, Ripudeep K, MD   0.5 mg at 09/06/22 1047   magnesium oxide (MAG-OX) tablet 400 mg  400 mg Oral BID Rai, Ripudeep K, MD       metoprolol tartrate (LOPRESSOR)  tablet 12.5 mg  12.5 mg Oral BID Rai, Ripudeep K, MD   12.5 mg at 09/06/22 1340   mirabegron ER (MYRBETRIQ) tablet 50 mg  50 mg Oral Daily Rai, Ripudeep K, MD   50 mg at 09/06/22 1340   nicotine (NICODERM CQ - dosed in mg/24 hours) patch 21 mg  21 mg Transdermal Daily Tu, Ching T, DO   21 mg at 09/06/22 1010   oxybutynin (DITROPAN-XL) 24 hr tablet 10 mg  10 mg Oral Daily Rai, Ripudeep K, MD   10 mg at 09/06/22 1340   oxyCODONE (Oxy IR/ROXICODONE) immediate release tablet 5 mg  5 mg Oral Q6H PRN Rai, Ripudeep K, MD   5 mg at 09/06/22 1340   polyethylene glycol (MIRALAX / GLYCOLAX) packet 17 g  17 g Oral Daily PRN Rai, Ripudeep K, MD       sodium chloride (OCEAN) 0.65 % nasal spray 1 spray  1 spray Each Nare PRN Rai, Ripudeep K, MD   1 spray at 09/06/22 1340   tamsulosin (FLOMAX) capsule 0.8 mg  0.8 mg Oral Daily Rai, Ripudeep K, MD   0.8 mg at 09/06/22 1340   [START ON 09/07/2022] vancomycin (VANCOREADY) IVPB 1750 mg/350 mL  1,750 mg Intravenous Q48H Lenis Noon, [facility]         Discharge Medications: Please see discharge summary for a list of discharge medications.  Relevant Imaging Results:  Relevant Lab Results:   Additional Information SS# [id number];  Vassie Moselle, LCSW

## 2022-09-07 DIAGNOSIS — L03818 Cellulitis of other sites: Secondary | ICD-10-CM | POA: Diagnosis not present

## 2022-09-07 DIAGNOSIS — R0602 Shortness of breath: Secondary | ICD-10-CM | POA: Diagnosis not present

## 2022-09-07 DIAGNOSIS — D649 Anemia, unspecified: Secondary | ICD-10-CM | POA: Diagnosis not present

## 2022-09-07 LAB — CBC
HCT: 30.3 % — ABNORMAL LOW (ref 39.0–52.0)
Hemoglobin: 8.8 g/dL — ABNORMAL LOW (ref 13.0–17.0)
MCH: 26.2 pg (ref 26.0–34.0)
MCHC: 29 g/dL — ABNORMAL LOW (ref 30.0–36.0)
MCV: 90.2 fL (ref 80.0–100.0)
Platelets: 216 10*3/uL (ref 150–400)
RBC: 3.36 MIL/uL — ABNORMAL LOW (ref 4.22–5.81)
RDW: 16.1 % — ABNORMAL HIGH (ref 11.5–15.5)
WBC: 6 10*3/uL (ref 4.0–10.5)
nRBC: 0 % (ref 0.0–0.2)

## 2022-09-07 LAB — GLUCOSE, CAPILLARY
Glucose-Capillary: 144 mg/dL — ABNORMAL HIGH (ref 70–99)
Glucose-Capillary: 193 mg/dL — ABNORMAL HIGH (ref 70–99)

## 2022-09-07 LAB — BASIC METABOLIC PANEL
Anion gap: 10 (ref 5–15)
BUN: 29 mg/dL — ABNORMAL HIGH (ref 8–23)
CO2: 23 mmol/L (ref 22–32)
Calcium: 8.5 mg/dL — ABNORMAL LOW (ref 8.9–10.3)
Chloride: 102 mmol/L (ref 98–111)
Creatinine, Ser: 1.99 mg/dL — ABNORMAL HIGH (ref 0.61–1.24)
GFR, Estimated: 37 mL/min — ABNORMAL LOW (ref >60)
Glucose, Bld: 142 mg/dL — ABNORMAL HIGH (ref 70–99)
Potassium: 4.9 mmol/L (ref 3.5–5.1)
Sodium: 135 mmol/L (ref 135–145)

## 2022-09-07 MED ORDER — LANTUS SOLOSTAR 100 UNIT/ML ~~LOC~~ SOPN: 15.0000 [IU] | PEN_INJECTOR | Freq: Every day | SUBCUTANEOUS | Status: DC

## 2022-09-07 MED ORDER — OXYCODONE HCL 5 MG PO TABS
20.0000 mg | ORAL_TABLET | Freq: Three times a day (TID) | ORAL | Status: DC | PRN
  Administered 2022-09-07: 20 mg via ORAL
  Filled 2022-09-07: qty 4

## 2022-09-07 MED ORDER — LORAZEPAM 0.5 MG PO TABS: 0.5000 mg | ORAL_TABLET | Freq: Four times a day (QID) | ORAL | 0 refills | Status: DC | PRN

## 2022-09-07 MED ORDER — DOXYCYCLINE HYCLATE 100 MG PO TBEC: 100.0000 mg | DELAYED_RELEASE_TABLET | Freq: Two times a day (BID) | ORAL | Status: AC

## 2022-09-07 MED ORDER — VANCOMYCIN HCL IN DEXTROSE 1-5 GM/200ML-% IV SOLN
1000.0000 mg | INTRAVENOUS | Status: DC
  Filled 2022-09-07: qty 200

## 2022-09-07 MED ORDER — MEDIHONEY WOUND/BURN DRESSING EX PSTE: 1.0000 | PASTE | Freq: Every day | CUTANEOUS | Status: DC

## 2022-09-07 MED ORDER — OXYCODONE HCL 20 MG PO TABS: 20.0000 mg | ORAL_TABLET | Freq: Three times a day (TID) | ORAL | 0 refills | Status: DC | PRN

## 2022-09-07 MED ORDER — FUROSEMIDE 40 MG PO TABS: 40.0000 mg | ORAL_TABLET | Freq: Every day | ORAL | Status: DC

## 2022-09-07 MED ORDER — AMOXICILLIN-POT CLAVULANATE 875-125 MG PO TABS: 1.0000 | ORAL_TABLET | Freq: Two times a day (BID) | ORAL | 0 refills | Status: AC

## 2022-09-07 NOTE — Progress Notes (Signed)
[place], spoke with nurse April, RN to give report on this patient being transferred back to this SNF after discharge this day. Patient is to be transported via [place]. Nurse did not have any questions/concerns at this time.

## 2022-09-07 NOTE — TOC Transition Note (Signed)
Transition of Care [facility]) - CM/SW Discharge Note   Patient Details  Name: Gregory [name] MRN: [id number] Date of Birth: July 15, 1960  [facility] CM/SW Contact:  Vassie Moselle, LCSW Phone Number: 09/07/2022, 12:10 PM   Clinical Narrative:    Pt to [facility] for [facility]. Pt will be going to room 155A. RN to call report to [phone]. PTAR called at 12:03pm to arrange transportation.    Final next level of care: Long Term Nursing Home Barriers to Discharge: Barriers Resolved   Patient Goals and CMS Choice CMS Medicare.gov Compare Post Acute Care list provided to:: Patient Choice offered to / list presented to : Patient  Discharge Placement                  Patient to be transferred to facility by: [place] Name of family member notified: Pt Patient and family notified of of transfer: 09/07/22  Discharge Plan and Services Additional resources added to the After Visit Summary for   In-house Referral: Clinical Social Work Discharge Planning Services: NA Post Acute Care Choice: [place], Nursing Home          DME Arranged: N/A DME Agency: NA                  Social Determinants of Health ([place]) Interventions SDOH Screenings   Food Insecurity: No Food Insecurity (09/06/2022)  Housing: Low Risk  (09/06/2022)  Transportation Needs: No Transportation Needs (09/06/2022)  Utilities: Not At Risk (09/06/2022)  Depression (PHQ2-9): Low Risk  (03/08/2021)  Tobacco Use: High Risk (09/06/2022)     Readmission Risk Interventions    09/06/2022    1:48 PM 08/27/2022   12:46 PM 05/03/2022   11:53 AM  Readmission Risk Prevention Plan  Transportation Screening Complete Complete Complete  Medication Review Press photographer) Complete Complete Complete  PCP or Specialist appointment within 3-5 days of discharge Complete Complete Complete  HRI or Home Care Consult Complete Complete Complete  SW Recovery Care/Counseling Consult Complete Complete  Complete  Palliative Care Screening Not Applicable Not Applicable Not Applicable  Skilled Nursing Facility Complete Complete Complete

## 2022-09-07 NOTE — Discharge Summary (Addendum)
Physician Discharge Summary   Patient: Gregory [name] MRN: [id number] DOB: 1961-02-25  Admit date:     09/05/2022  Discharge date: 09/07/22  Discharge Physician: Estill Cotta, MD    PCP: [name], MD   Recommendations at discharge:   Continue Augmentin 875-125 mg twice daily for 1 week Continue doxycycline 100 mg p.o. 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 He was just hospitalized from 3/6-3/11 with severe sepsis due to left LE cellulitis and multifocal pneumonia and Stretococcus group G bacteremia. He was treated initially with Vancomycin,Cefepime and azithromycin. He was then narrowed to Rocephin and azithromycin based on susceptibilities. Subsequently he was discharged on 2 weeks of Augmentin. He was also treated for acute on chronic hypoxic respiratory failure due to COPD exacerbation from both multifocal pneumonia and influenza A treated with Tamiflu and antibiotics as above.    Patient now presented with increasing shortness of breath since the night before the admission, at rest.  Wheelchair-bound at baseline.  Hemoglobin of 7.4 from baseline of 8-9.  Reported no dark stools, not on anticoagulation, declined to have any GI workup done.   Patient was admitted  for further workup.  Assessment and Plan:  Acute on chronic anemia, normocytic -Baseline hemoglobin 8-9, has chronic anemia due to CKD -Patient refuses any scopes if warranted, agreed for 1 unit packed RBCs -Patient received 1 unit packed RBCs on 3/21, hemoglobin 8.8 on discharge.  Chronic hypoxic respiratory failure (HCC) -Continue O2 2 L via [place], dyspnea symptoms likely from acute anemia -Currently no wheezing   Acute on stage 3b chronic kidney disease (CKD) (HCC) -Baseline creatinine 1.7-2.2 with presenting Cr of 2.16 -Patient noted to have significant edema, lymphedema in the LLE with cellulitis, was given IV Lasix.  -Creatinine stable, transitioned to oral Lasix 40 mg daily - Follow BMET     Cellulitis of left lower extremity Recent admission with sepsis, cellulitis, Streptococcus bacteremia -recently discharged on 08/27/2022, initially treated with Vancomycin and Cefepime. Then narrowed to Rocephin and ultimately discharged with 2 weeks of Augmentin based on susceptibilities from streptococcus group G bacteremia.  -Patient was placed on IV vancomycin and cefepime while inpatient.  -Transition to oral doxycycline and Augmentin for 1 more week to complete full course -Patient recommended to continue to elevate left lower extremity, wound care and Lasix   DM2 (diabetes mellitus, type 2) (HCC)  -Continue Lantus 15 units daily, sliding scale insulin.     HTN (hypertension) -BP now improved, continue Lasix, metoprolol   Chronic diastolic HF (heart failure) (HCC) -Placed on Lasix 40 mg daily PO (was on Lasix 20 mg daily outpatient) -Follow bmet   Pressure injury documentation Left thigh posterior proximal stage II, POA Left ankle stage II, POA  Obesity Estimated body mass index  is 38.71 kg/m as calculated from the following:   Height as of this encounter: 5\' 7"  (1.702 m).   Weight as of this encounter: 112.1 kg.      Pain control - [employer] Controlled Substance  Reporting System database was reviewed. and patient was instructed, not to drive, operate heavy machinery, perform activities at heights, swimming or participation in water activities or provide baby-sitting services while on Pain, Sleep and Anxiety Medications; until their outpatient Physician has advised to do so again. Also recommended to not to take more than prescribed Pain, Sleep and Anxiety Medications.  Consultants: None Procedures performed: None Disposition: Skilled nursing facility Diet recommendation:  Discharge Diet Orders (From admission, onward)     Start     Ordered   09/07/22 0000  Carb modified diet      09/07/22 1138            DISCHARGE MEDICATION: Allergies as of 09/07/2022       Reactions   Latex Other (See Comments)   "ALLERGIC," per [facility]        Medication List     STOP taking these medications    eszopiclone 2 MG Tabs tablet Commonly known as: LUNESTA   gabapentin 300 MG capsule Commonly known as: NEURONTIN   guaiFENesin 200 MG tablet   hydrOXYzine 25 MG tablet Commonly known as: ATARAX   levalbuterol 0.63 MG/3ML nebulizer solution Commonly known as: XOPENEX   pregabalin 150 MG capsule Commonly known as: LYRICA   rOPINIRole 2 MG tablet Commonly known as: REQUIP       TAKE these medications    Afrin 12 Hour 0.05 % nasal spray Generic drug: oxymetazoline Place 1 spray into both nostrils every 12 (twelve) hours as needed for congestion.   amoxicillin-clavulanate 875-125 MG tablet Commonly known as: AUGMENTIN Take 1 tablet by mouth 2 (two) times daily for 7 days.   Biofreeze 4 % Gel Generic drug: Menthol (Topical Analgesic) Apply 1 Application topically See admin instructions. Apply to right leg/stump every 8 hours as needed for pain   cetirizine 10 MG tablet Commonly known as: ZYRTEC Take 10 mg by mouth daily.   cyanocobalamin 1000 MCG tablet Take 1,000 mcg by mouth daily.   docusate sodium 100 MG capsule Commonly known  as: COLACE Take 1 capsule (100 mg total) by mouth 2 (two) times daily.   doxycycline 100 MG EC tablet Commonly known as: DORYX Take 1 tablet (100 mg total) by mouth 2 (two) times daily for 7 days.   fenofibrate 48 MG tablet Commonly known as: TRICOR Take 48 mg by mouth daily.   ferrous sulfate 325 (65 FE) MG tablet Take 1 tablet (325 mg total) by mouth daily with breakfast.   fluticasone 50 MCG/ACT nasal spray Commonly known as: FLONASE Place 1 spray into both nostrils in the morning.   furosemide 40 MG tablet Commonly known as: LASIX Take 1 tablet (40 mg total) by mouth daily. What changed:  medication strength how much to take   Glucagon Emergency 1 MG Kit Inject 1 mg into the vein as needed (for hypolycemia).   insulin lispro 100 UNIT/ML KwikPen Commonly known as: HUMALOG Inject 0-10 Units into the skin See admin instructions. Inject 0-10 units into the skin three times a day and at bedtime, PER SLIDING SCALE:  BGL 151-200 = 2 units 201-250 = 4 units 251-300 = 6 units 301-350 =  8 units 351-400 = 10 units What changed:  how much to take additional instructions  ipratropium-albuterol 0.5-2.5 (3) MG/3ML Soln Commonly known as: DUONEB Take 3 mLs by nebulization every 6 (six) hours as needed (for shortness of breath).   lactulose 10 GM/15ML solution Commonly known as: CHRONULAC Take 15 mLs (10 g total) by mouth 3 (three) times daily as needed for moderate constipation. What changed: when to take this   Lantus SoloStar 100 UNIT/ML Solostar Pen Generic drug: insulin glargine Inject 15 Units into the skin daily.   leptospermum manuka honey Pste paste Apply 1 Application topically daily. Start taking on: September 08, 2022   lidocaine 4 % Apply 1 patch topically See admin instructions. Apply 1 patch to both the right shoulder and the right stump at 9 AM daily- remove at 9 PM   LORazepam 0.5 MG tablet Commonly known as: ATIVAN Take 1 tablet (0.5 mg total) by mouth  every 6 (six) hours as needed for anxiety.   Lubricant Eye Drops 0.4-0.3 % Soln Generic drug: Polyethyl Glycol-Propyl Glycol Place 2 drops into both eyes every 4 (four) hours as needed (dryness/itching).   magnesium oxide 400 MG tablet Commonly known as: MAG-OX Take 400 mg by mouth 2 (two) times daily.   melatonin 3 MG Tabs tablet Take 3 mg by mouth at bedtime.   methenamine 1 g tablet Commonly known as: MANDELAMINE Take 1 g by mouth daily.   metoprolol tartrate 25 MG tablet Commonly known as: LOPRESSOR Take 0.5 tablets (12.5 mg total) by mouth 2 (two) times daily.   mirabegron ER 50 MG Tb24 tablet Commonly known as: MYRBETRIQ Take 50 mg by mouth daily.   multivitamin with minerals tablet Take 1 tablet by mouth daily.   PreserVision AREDS 2 Caps Take 1 capsule by mouth in the morning and at bedtime.   Narcan 4 MG/0.1ML Liqd nasal spray kit Generic drug: naloxone Place 1 spray into the nose as needed (poorly responsive or turning blue).   nystatin cream Commonly known as: MYCOSTATIN Apply 1 Application topically See admin instructions. Apply to wound site every day shift and as needed, for wound care   omeprazole 20 MG capsule Commonly known as: PRILOSEC Take 20 mg by mouth daily.   ondansetron 4 MG tablet Commonly known as: ZOFRAN Take 4 mg by mouth every 8 (eight) hours as needed for nausea.   oxybutynin 10 MG 24 hr tablet Commonly known as: DITROPAN-XL Take 10 mg by mouth daily.   Oxycodone HCl 20 MG Tabs Take 1 tablet (20 mg total) by mouth every 8 (eight) hours as needed (severe pain). What changed:  when to take this reasons to take this additional instructions Another medication with the same name was removed. Continue taking this medication, and follow the directions you see here.   OXYGEN Inhale 2 L/min into the lungs as needed (for shortness of breath- to maintain sats >90%).   polyethylene glycol 17 g packet Commonly known as: MIRALAX /  GLYCOLAX Take 17 g by mouth daily as needed for mild constipation.   Pro-Stat Liqd Take 30 mLs by mouth in the morning.   rosuvastatin 10 MG tablet Commonly known as: CRESTOR Take 10 mg by mouth at bedtime.   senna 8.6 MG Tabs tablet Commonly known as: SENOKOT Take 1 tablet by mouth at bedtime.   Spiriva Respimat 1.25 MCG/ACT Aers Generic drug: Tiotropium Bromide Monohydrate Inhale 1 each into the lungs in the morning.   tamsulosin 0.4 MG Caps capsule Commonly known as: FLOMAX Take 0.8 mg by mouth daily.   Trulicity [id number] Sopn Generic  drug: Dulaglutide Inject 0.75 mg into the skin every Wednesday.   Uribel 118 MG Caps Take 1 capsule by mouth every 8 (eight) hours as needed (bladder pain).               Discharge Care Instructions  (From admission, onward)           Start     Ordered   09/07/22 0000  Discharge wound care:       Comments: Wound care  Daily      Comments: Apply Medihoney to left anterior ankle wound Q day, then cover this and the rest of the leg with xeroform gauze, ABD pads, and kerlex, beginning just behind the toes to below the knee.   09/07/22 1138            Follow-up Information     [name], MD. Schedule an appointment as soon as possible for a visit in 2 week(s).   Specialty: Internal Medicine Why: for hospital follow-up Contact information: [address] [place] [address] [phone]                Discharge Exam: Danley Danker Weights   09/06/22 0459  Weight: 112.1 kg   S: Patient eager to leave and go back to skilled nursing facility.  Wants his higher dose of oxycodone 20mg  as he was receiving at SNF.  No fevers or chills.  No other complaints.  BP 138/76 (BP Location: Left Arm)   Pulse 72   Temp (!) 97.5 F (36.4 C) (Oral)   Resp 20   Ht 5\' 7"  (1.702 m)   Wt 112.1 kg   SpO2 95%   BMI 38.71 kg/m    Physical Exam General: Alert and oriented x 3, NAD Cardiovascular: S1 S2 clear, RRR.   Respiratory: CTAB, no wheezing, Gastrointestinal: Soft, nontender, nondistended, NBS Ext: right AKA Neuro: no new deficits Skin: Chronic venous stasis changes with lymphedema to the entire left lower extremity, amputation of the 1st-3rd digit Psych: Normal affect    Condition at discharge: fair  The results of significant diagnostics from this hospitalization (including imaging, microbiology, ancillary and laboratory) are listed below for reference.   Imaging Studies: DG Tibia/Fibula Left  Result Date: 09/05/2022 CLINICAL DATA:  Left lower extremity swelling EXAM: LEFT TIBIA AND FIBULA - 2 VIEW; LEFT FOOT - COMPLETE 3 VIEW COMPARISON:  08/22/2022 FINDINGS: Left foot: Severe soft tissue swelling about the foot. No obvious soft tissue gas. There has been amputation of the phalanges of the first and second digit as well as the distal aspect of the first and second metatarsals. Residual sesamoid bone of the first ray. No obvious acute fracture or dislocation. No definite separate erosive changes. Views are oblique, limiting evaluation. In addition the extreme distal tip of the distal phalanx of fourth digit is clipped off the edge of the film. There is also been amputation of the distal phalanx and distal aspect of the middle phalanx of third digit. Osteopenia. Stable erosive area involving the head of the third metatarsal. Tibia and fibula: Extensive soft tissue swelling particularly towards the ankle. No underlying fracture or dislocation. Osteopenia. Preserved adjacent joint spaces except for mild joint space loss of the medial compartment of the knee. IMPRESSION: Severe soft tissue swelling particularly of the ankle and foot. No obvious fracture or dislocation. Evaluation of the foot is somewhat limited due to positioning and technique. Surgical changes involving amputation of the phalanges of the first and second digit as well as  the distal aspect of the third digit. Is also amputation of the  distal aspect of the first and second metatarsals. Stable erosive changes involving the head of third metatarsal. If there is further concern of bone infection, additional evaluation with MRI or bone scan may be useful to further delineate. Electronically Signed   By: Jill Side M.D.   On: 09/05/2022 18:41   DG Foot Complete Left  Result Date: 09/05/2022 CLINICAL DATA:  Left lower extremity swelling EXAM: LEFT TIBIA AND FIBULA - 2 VIEW; LEFT FOOT - COMPLETE 3 VIEW COMPARISON:  08/22/2022 FINDINGS: Left foot: Severe soft tissue swelling about the foot. No obvious soft tissue gas. There has been amputation of the phalanges of the first and second digit as well as the distal aspect of the first and second metatarsals. Residual sesamoid bone of the first ray. No obvious acute fracture or dislocation. No definite separate erosive changes. Views are oblique, limiting evaluation. In addition the extreme distal tip of the distal phalanx of fourth digit is clipped off the edge of the film. There is also been amputation of the distal phalanx and distal aspect of the middle phalanx of third digit. Osteopenia. Stable erosive area involving the head of the third metatarsal. Tibia and fibula: Extensive soft tissue swelling particularly towards the ankle. No underlying fracture or dislocation. Osteopenia. Preserved adjacent joint spaces except for mild joint space loss of the medial compartment of the knee. IMPRESSION: Severe soft tissue swelling particularly of the ankle and foot. No obvious fracture or dislocation. Evaluation of the foot is somewhat limited due to positioning and technique. Surgical changes involving amputation of the phalanges of the first and second digit as well as the distal aspect of the third digit. Is also amputation of the distal aspect of the first and second metatarsals. Stable erosive changes involving the head of third metatarsal. If there is further concern of bone infection, additional  evaluation with MRI or bone scan may be useful to further delineate. Electronically Signed   By: Jill Side M.D.   On: 09/05/2022 18:41   DG Chest 2 View  Result Date: 09/05/2022 CLINICAL DATA:  Shortness of breath EXAM: CHEST - 2 VIEW COMPARISON:  08/22/2022 FINDINGS: Heart size upper limits of normal. Mediastinal shadows are normal. There may be pulmonary venous hypertension but there is no frank edema. No consolidation or collapse. No effusion. IMPRESSION: Question pulmonary venous hypertension. No frank edema. No consolidation or collapse. Electronically Signed   By: Nelson Chimes M.D.   On: 09/05/2022 15:59   VAS [place] LOWER EXTREMITY VENOUS (DVT)  Result Date: 08/23/2022  Lower Venous DVT Study Patient Name:  DAWYNE DOTEN  Date of Exam:   08/23/2022 Medical Rec #: [id number]     Accession #:    [id number] Date of Birth: 1960/09/25    Patient Gender: M Patient Age:   43 years Exam Location:  [facility] Procedure:      VAS [place] LOWER EXTREMITY VENOUS (DVT) Referring Phys: Wynetta Fines --------------------------------------------------------------------------------  Indications: Pain, Swelling, and ulceration.  Risk Factors: Right Above knee amputation 11/19/18. Multiple left toe amputations Limitations: Poor ultrasound/tissue interface and Woody edema. Comparison Study: Prior negative left LEV done 07/05/21 Performing Technologist: Sharion Dove RVS  Examination Guidelines: A complete evaluation includes B-mode imaging, spectral Doppler, color Doppler, and power Doppler as needed of all accessible portions of each vessel. Bilateral testing is considered an integral part of a complete examination. Limited examinations for reoccurring indications may be performed  as noted. The reflux portion of the exam is performed with the patient in reverse Trendelenburg.  +-------+---------------+---------+-----------+----------+--------------+ RIGHT  CompressibilityPhasicitySpontaneityPropertiesThrombus Aging  +-------+---------------+---------+-----------+----------+--------------+ CFV    Full           Yes      Yes                                 +-------+---------------+---------+-----------+----------+--------------+ FV ProxFull                                                        +-------+---------------+---------+-----------+----------+--------------+ PFV    Full                                                        +-------+---------------+---------+-----------+----------+--------------+   +---------+---------------+---------+-----------+----------+-------------------+ LEFT     CompressibilityPhasicitySpontaneityPropertiesThrombus Aging      +---------+---------------+---------+-----------+----------+-------------------+ CFV      Full           Yes      Yes                                      +---------+---------------+---------+-----------+----------+-------------------+ SFJ      Full                                                             +---------+---------------+---------+-----------+----------+-------------------+ FV Prox  Full                                                             +---------+---------------+---------+-----------+----------+-------------------+ FV Mid   Full                                                             +---------+---------------+---------+-----------+----------+-------------------+ FV DistalFull                                                             +---------+---------------+---------+-----------+----------+-------------------+ PFV      Full                                                             +---------+---------------+---------+-----------+----------+-------------------+  POP      Full           Yes      Yes                                      +---------+---------------+---------+-----------+----------+-------------------+ PTV                                                    Not well visualized +---------+---------------+---------+-----------+----------+-------------------+ PERO                                                  Not well visualized +---------+---------------+---------+-----------+----------+-------------------+     Summary: RIGHT: - No evidence of common femoral vein obstruction.  LEFT: - There is no evidence of deep vein thrombosis in the lower extremity. However, portions of this examination were limited- see technologist comments above.  - Attempted left ABI/duplex of left PTA, ATA. However, skin/tissue properties prohibit evaluation of the vessels of the calf/ankle - Ultrasound characteristics of enlarged, reactive lymph nodes noted in the groin.  *See table(s) above for measurements and observations. Electronically signed by Orlie Pollen on 08/23/2022 at 4:21:00 PM.    Final    MR FOOT LEFT WO CONTRAST  Result Date: 08/23/2022 CLINICAL DATA:  Osteomyelitis, foot EXAM: MRI OF THE LEFT FOOT WITHOUT CONTRAST TECHNIQUE: Multiplanar, multisequence MR imaging of the left forefoot was performed. No intravenous contrast was administered. COMPARISON:  Left radiograph 08/22/2022 FINDINGS: Bones/Joint/Cartilage Prior partial first and second ray amputations. Prior partial third toe amputation. There is focal blunting of the fourth toe distal phalangeal tuft without marrow edema, likely chronic. There is chronic erosive changes at the third metatarsal head and arthritis of the third MTP joint. There is no significant associated marrow edema and there is preserved T1 marrow signal. Ligaments Intact Lisfranc ligament. Muscles and Tendons Chronic denervation changes in the foot. Soft tissues Severe diffuse skin thickening and soft tissue swelling of the foot. There is no organized fluid collection on noncontrast MRI. IMPRESSION: Severe diffuse skin thickening and soft tissue swelling of the foot, as can be seen cellulitis. No evidence soft tissue abscess on  noncontrast MRI. Chronic erosive change at the third metatarsal head and arthritis of the third MTP joint. Focal blunting of the fourth toe distal phalangeal tuft without marrow edema, also likely chronic. No evidence of acute osteomyelitis. Electronically Signed   By: Maurine Simmering M.D.   On: 08/23/2022 11:43   DG Foot 2 Views Left  Result Date: 08/22/2022 CLINICAL DATA:  Cellulitis EXAM: LEFT FOOT - 2 VIEW COMPARISON:  Radiograph 06/03/2018 FINDINGS: Severe soft tissue swelling of the left foot and ankle. Prior partial first and second ray amputations. Prior partial third toe amputation. Osseous lucency in the third metatarsal head and articular surface irregularity of the third MTP joint. New blunting and cortical irregularity of the fourth toe distal phalangeal tuft. Focal lucency in the posterior calcaneus. IMPRESSION: Severe soft tissue swelling of the left foot and ankle. Osseous lucency in the third metatarsal head and articular surface irregularity of the third MTP joint, and new blunting and cortical irregularity of the  fourth toe distal phalangeal tuft, suspicious for osteomyelitis. Focal osseous lucency in the posterior calcaneus, correlate with presence of adjacent ulcer in the heel. Electronically Signed   By: Maurine Simmering M.D.   On: 08/22/2022 15:54   DG Tibia/Fibula Left  Result Date: 08/22/2022 CLINICAL DATA:  Cellulitis EXAM: LEFT TIBIA AND FIBULA - 2 VIEW COMPARISON:  None Available. FINDINGS: No acute osseous abnormality involving the left tibia or fibula. Alignment is normal. There is no bone erosion or frank bony destruction. There is severe soft tissue swelling of the left lower leg. IMPRESSION: Severe soft tissue swelling of the left lower leg. No acute osseous abnormality involving the left tibia or fibula. Electronically Signed   By: Maurine Simmering M.D.   On: 08/22/2022 15:48   DG Chest Port 1 View  Result Date: 08/22/2022 CLINICAL DATA:  Questionable sepsis EXAM: PORTABLE CHEST 1 VIEW  COMPARISON:  Radiograph 05/01/2022 FINDINGS: Stable cardiac silhouette. There is patchy bilateral airspace disease more dense on the LEFT. No pneumothorax. Anterior posterior cervical fusion IMPRESSION: Bilateral airspace disease representing multifocal pneumonia versus pulmonary edema. Electronically Signed   By: Suzy Bouchard M.D.   On: 08/22/2022 10:00    Microbiology: Results for orders placed or performed during the hospital encounter of 09/05/22  Blood culture (routine x 2)     Status: None (Preliminary result)   Collection Time: 09/05/22  4:47 PM   Specimen: BLOOD  Result Value Ref Range Status   Specimen Description   Final    BLOOD SITE NOT SPECIFIED Performed at [facility] [address], [place], [place], [place] [address]    Special Requests   Final    BOTTLES DRAWN AEROBIC AND ANAEROBIC Blood Culture adequate volume Performed at [facility] [address], [place], [place] [address]    Culture   Final    NO GROWTH 2 DAYS Performed at [facility] [address] [place], [place] [address]    Report Status PENDING  Incomplete  Blood culture (routine x 2)     Status: None (Preliminary result)   Collection Time: 09/06/22  3:57 AM   Specimen: BLOOD  Result Value Ref Range Status   Specimen Description   Final    BLOOD BLOOD LEFT ARM Performed at [facility] [address], [place], [place] [address]    Special Requests   Final    BOTTLES DRAWN AEROBIC ONLY Blood Culture adequate volume Performed at [facility] [address] [place], [place] [address]    Culture   Final    NO GROWTH 1 DAY Performed at [facility], [place] [address] [place], [place] [address]    Report Status PENDING  Incomplete  Culture, blood (Routine X 2) w Reflex to ID Panel     Status: None (Preliminary result)   Collection Time: 09/06/22  3:57 AM   Specimen: BLOOD  Result Value Ref Range Status   Specimen Description    Final    BLOOD BLOOD LEFT HAND Performed at [facility] [address] [place], [place] [address]    Special Requests   Final    BOTTLES DRAWN AEROBIC ONLY Blood Culture adequate volume Performed at [facility] [address], [place], [place] [address]    Culture   Final    NO GROWTH 1 DAY Performed at [facility], [place] [address], [place], [place] [address]    Report Status  PENDING  Incomplete    Labs: CBC: Recent Labs  Lab 09/05/22 1635 09/06/22 1349 09/07/22 0557  WBC 7.4 6.7 6.0  NEUTROABS 5.0  --   --   HGB 7.4* 8.4* 8.8*  HCT 25.2* 28.6* 30.3*  MCV 88.7 88.3 90.2  PLT 279 225 [id number]   Basic Metabolic Panel: Recent Labs  Lab 09/05/22 1635 09/06/22 1349 09/07/22 0557  NA 135 135 135  K 5.0 5.1 4.9  CL 103 102 102  CO2 26 25 23   GLUCOSE 280* 172* 142*  BUN 31* 27* 29*  CREATININE 2.16* 1.98* 1.99*  CALCIUM 8.2* 8.3* 8.5*   Liver Function Tests: Recent Labs  Lab 09/05/22 1635  AST 10*  ALT 17  ALKPHOS 61  BILITOT 0.4  PROT 6.9  ALBUMIN 2.8*   CBG: Recent Labs  Lab 09/06/22 0809 09/06/22 1137 09/06/22 1629 09/06/22 2052 09/07/22 0738  GLUCAP 146* 241* 263* 157* 144*    Discharge time spent: greater than 30 minutes.  Signed: Estill Cotta, MD [facility] 09/07/2022

## 2022-09-07 NOTE — Plan of Care (Signed)

## 2022-09-07 NOTE — Progress Notes (Signed)
Discharge instructions given to patient without question/concerns. All personal items accounted for and given to patient. All IV's will be discontinued once [facility] arrives. Awaiting [facility] transportation to take patient back to SNF.

## 2022-09-07 NOTE — Progress Notes (Signed)
Pharmacy Antibiotic Note  Gregory [name] is a 62 y.o. male admitted on 09/05/2022 with  cellulitis/wound infection .  Pharmacy has been consulted for Vanco, Cefepime dosing.  ID: Sepsis - chronic leg wound w/drainage. Xray neg for osteo. (Recent discharge on Augmentin) - Afebrile, WBC WNL, Scr 1.99 down  Cefepime 3/20>> Vanco 3/20>>  3/20: BC x 2>>NGTD  Dose adjustments:  3/20: Vanc 1750 mg q48h (AUC 447, SCr 2.16, Vd 0.7) 3/22: Change to 1g IV q24h (AUC 526, Scr 1.99)   Plan: -Change Vanco to 1g IV q24h  (AUC 526, Scr 1.99) -Cefepime 2 g q12h    Height: 5\' 7"  (170.2 cm) Weight: 112.1 kg (247 lb 2.2 oz) IBW/kg (Calculated) : 66.1  Temp (24hrs), Avg:98.1 F (36.7 C), Min:97.5 F (36.4 C), Max:99 F (37.2 C)  Recent Labs  Lab 09/05/22 1635 09/05/22 1647 09/06/22 1349 09/07/22 0557  WBC 7.4  --  6.7 6.0  CREATININE 2.16*  --  1.98* 1.99*  LATICACIDVEN  --  1.0  --   --     Estimated Creatinine Clearance: 45.4 mL/min (A) (by C-G formula based on SCr of 1.99 mg/dL (H)).    Allergies  Allergen Reactions   Latex Other (See Comments)    "ALLERGIC," per [facility]   [name] [name], PharmD, BCPS Clinical Staff Pharmacist [email or web address]  [name] 09/07/2022 8:41 AM

## 2022-09-09 NOTE — ED Provider Notes (Signed)
Late entry: Patient seen with Dorena Bodo on March 20.  61 y.o. male with medical history significant of insulin dependent T2DM, COPD with chronic hypoxic respiratory failure on 2L, CKD IIIb, Chronic HFpEF, diabetic foot ulcer status post right AKA presents with shortness of breath and worsening left LE pain and weeping  Recent hospitalization March 6 - March 11 with sepsis and bacteremia. Facility told him he needed a blood transfusion.  Has increased pain, swelling and redness to left lower extremity for the past 1 week.  Feels shortness of breath and hypoxic to the mid 80s on room air.  Denies chest pain.  No known fever.  Chronically ill-appearing, no distress, Extensive swelling and erythema to left leg as depicted. 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  Open wounds involving dorsal foot and ankle with some purulent drainage.  Pulses present with Doppler.  Concern for recurrent cellulitis and borderline sepsis with failure of outpatient antibiotics.  Will initiate judicious IV fluid resuscitation and broad-spectrum antibiotics after cultures are obtained. Also will likely need blood transfusion but no evidence of active bleeding currently.   Gregory Essex, MD 09/09/22 [phone]

## 2022-09-10 LAB — CULTURE, BLOOD (ROUTINE X 2)
Culture: NO GROWTH
Special Requests: ADEQUATE

## 2022-09-11 LAB — CULTURE, BLOOD (ROUTINE X 2)
Culture: NO GROWTH
Culture: NO GROWTH
Special Requests: ADEQUATE
Special Requests: ADEQUATE

## 2022-09-13 ENCOUNTER — Other Ambulatory Visit: Payer: Self-pay

## 2022-09-13 ENCOUNTER — Emergency Department (HOSPITAL_COMMUNITY): Payer: Medicaid Other

## 2022-09-13 ENCOUNTER — Encounter (HOSPITAL_COMMUNITY): Payer: Self-pay

## 2022-09-13 ENCOUNTER — Emergency Department (HOSPITAL_COMMUNITY)
Admission: EM | Admit: 2022-09-13 | Discharge: 2022-09-13 | Disposition: A | Discharge status: Skilled Nursing Facility | Payer: Medicaid Other | Attending: Emergency Medicine | Admitting: Emergency Medicine

## 2022-09-13 DIAGNOSIS — J449 Chronic obstructive pulmonary disease, unspecified: Secondary | ICD-10-CM | POA: Insufficient documentation

## 2022-09-13 DIAGNOSIS — I5032 Chronic diastolic (congestive) heart failure: Secondary | ICD-10-CM | POA: Insufficient documentation

## 2022-09-13 DIAGNOSIS — Z79899 Other long term (current) drug therapy: Secondary | ICD-10-CM | POA: Diagnosis not present

## 2022-09-13 DIAGNOSIS — F17210 Nicotine dependence, cigarettes, uncomplicated: Secondary | ICD-10-CM | POA: Insufficient documentation

## 2022-09-13 DIAGNOSIS — Z8616 Personal history of COVID-19: Secondary | ICD-10-CM | POA: Diagnosis not present

## 2022-09-13 DIAGNOSIS — C441191 Basal cell carcinoma of skin of left upper eyelid, including canthus: Secondary | ICD-10-CM | POA: Insufficient documentation

## 2022-09-13 DIAGNOSIS — I130 Hypertensive heart and chronic kidney disease with heart failure and stage 1 through stage 4 chronic kidney disease, or unspecified chronic kidney disease: Secondary | ICD-10-CM | POA: Insufficient documentation

## 2022-09-13 DIAGNOSIS — N1832 Chronic kidney disease, stage 3b: Secondary | ICD-10-CM | POA: Insufficient documentation

## 2022-09-13 DIAGNOSIS — Z85820 Personal history of malignant melanoma of skin: Secondary | ICD-10-CM | POA: Diagnosis not present

## 2022-09-13 DIAGNOSIS — Z794 Long term (current) use of insulin: Secondary | ICD-10-CM | POA: Diagnosis not present

## 2022-09-13 DIAGNOSIS — R531 Weakness: Secondary | ICD-10-CM | POA: Diagnosis not present

## 2022-09-13 DIAGNOSIS — Z7951 Long term (current) use of inhaled steroids: Secondary | ICD-10-CM | POA: Insufficient documentation

## 2022-09-13 DIAGNOSIS — R110 Nausea: Secondary | ICD-10-CM | POA: Insufficient documentation

## 2022-09-13 LAB — I-STAT VENOUS BLOOD GAS, ED
Acid-Base Excess: 1 mmol/L (ref 0.0–2.0)
Bicarbonate: 26.9 mmol/L (ref 20.0–28.0)
Calcium, Ion: 1.25 mmol/L (ref 1.15–1.40)
HCT: 33 % — ABNORMAL LOW (ref 39.0–52.0)
Hemoglobin: 11.2 g/dL — ABNORMAL LOW (ref 13.0–17.0)
O2 Saturation: 90 %
Potassium: 4.6 mmol/L (ref 3.5–5.1)
Sodium: 137 mmol/L (ref 135–145)
TCO2: 28 mmol/L (ref 22–32)
pCO2, Ven: 47.4 mmHg (ref 44–60)
pH, Ven: 7.362 (ref 7.25–7.43)
pO2, Ven: 62 mmHg — ABNORMAL HIGH (ref 32–45)

## 2022-09-13 LAB — COMPREHENSIVE METABOLIC PANEL
ALT: 12 U/L (ref 0–44)
AST: 17 U/L (ref 15–41)
Albumin: 3 g/dL — ABNORMAL LOW (ref 3.5–5.0)
Alkaline Phosphatase: 64 U/L (ref 38–126)
Anion gap: 13 (ref 5–15)
BUN: 23 mg/dL (ref 8–23)
CO2: 24 mmol/L (ref 22–32)
Calcium: 9.2 mg/dL (ref 8.9–10.3)
Chloride: 97 mmol/L — ABNORMAL LOW (ref 98–111)
Creatinine, Ser: 1.96 mg/dL — ABNORMAL HIGH (ref 0.61–1.24)
GFR, Estimated: 38 mL/min — ABNORMAL LOW (ref >60)
Glucose, Bld: 174 mg/dL — ABNORMAL HIGH (ref 70–99)
Potassium: 4.8 mmol/L (ref 3.5–5.1)
Sodium: 134 mmol/L — ABNORMAL LOW (ref 135–145)
Total Bilirubin: 0.6 mg/dL (ref 0.3–1.2)
Total Protein: 8 g/dL (ref 6.5–8.1)

## 2022-09-13 LAB — CBC WITH DIFFERENTIAL/PLATELET
Abs Immature Granulocytes: 0.03 10*3/uL (ref 0.00–0.07)
Basophils Absolute: 0.1 10*3/uL (ref 0.0–0.1)
Basophils Relative: 1 %
Eosinophils Absolute: 0.2 10*3/uL (ref 0.0–0.5)
Eosinophils Relative: 2 %
HCT: 32.9 % — ABNORMAL LOW (ref 39.0–52.0)
Hemoglobin: 9.9 g/dL — ABNORMAL LOW (ref 13.0–17.0)
Immature Granulocytes: 0 %
Lymphocytes Relative: 23 %
Lymphs Abs: 1.7 10*3/uL (ref 0.7–4.0)
MCH: 26 pg (ref 26.0–34.0)
MCHC: 30.1 g/dL (ref 30.0–36.0)
MCV: 86.4 fL (ref 80.0–100.0)
Monocytes Absolute: 0.5 10*3/uL (ref 0.1–1.0)
Monocytes Relative: 6 %
Neutro Abs: 5.1 10*3/uL (ref 1.7–7.7)
Neutrophils Relative %: 68 %
Platelets: 317 10*3/uL (ref 150–400)
RBC: 3.81 MIL/uL — ABNORMAL LOW (ref 4.22–5.81)
RDW: 15.1 % (ref 11.5–15.5)
WBC: 7.6 10*3/uL (ref 4.0–10.5)
nRBC: 0 % (ref 0.0–0.2)

## 2022-09-13 LAB — PROTIME-INR
INR: 1.1 (ref 0.8–1.2)
Prothrombin Time: 14.5 seconds (ref 11.4–15.2)

## 2022-09-13 LAB — BRAIN NATRIURETIC PEPTIDE: B Natriuretic Peptide: 55.6 pg/mL (ref 0.0–100.0)

## 2022-09-13 MED ORDER — LORAZEPAM 1 MG PO TABS
0.5000 mg | ORAL_TABLET | Freq: Once | ORAL | Status: AC
  Administered 2022-09-13: 0.5 mg via ORAL
  Filled 2022-09-13: qty 1

## 2022-09-13 MED ORDER — OXYCODONE HCL 5 MG PO TABS
10.0000 mg | ORAL_TABLET | Freq: Once | ORAL | Status: AC
  Administered 2022-09-13: 10 mg via ORAL
  Filled 2022-09-13: qty 2

## 2022-09-13 MED ORDER — LACTATED RINGERS IV BOLUS
500.0000 mL | Freq: Once | INTRAVENOUS | Status: AC
  Administered 2022-09-13: 500 mL via INTRAVENOUS

## 2022-09-13 MED ORDER — OXYCODONE-ACETAMINOPHEN 5-325 MG PO TABS
2.0000 | ORAL_TABLET | Freq: Once | ORAL | Status: AC
  Administered 2022-09-13: 2 via ORAL
  Filled 2022-09-13: qty 2

## 2022-09-13 MED ORDER — ONDANSETRON HCL 4 MG/2ML IJ SOLN
4.0000 mg | Freq: Once | INTRAMUSCULAR | Status: AC
  Administered 2022-09-13: 4 mg via INTRAVENOUS
  Filled 2022-09-13: qty 2

## 2022-09-13 NOTE — ED Provider Notes (Signed)
[facility] Provider Note  CSN: [id number] Arrival date & time: 09/13/22 [id number]  Chief Complaint(s) Nausea  HPI Gregory [name] is a 62 y.o. male history of CKD, COPD, diastolic CHF, diabetes, lymphedema, right BKA presenting to the emergency department with nausea.  Patient reports nausea for the past few days.  He also reports that he has had a cough over the last week which is productive.  No shortness of breath.  No fevers.  Reports generalized weakness.  No chest pain, abdominal pain, back pain.  Reports leg swelling is at baseline.  Endorses chronic leg pain which is not any different than normal.  He reports that he was recently in the hospital with the flu.    Past Medical History Past Medical History:  Diagnosis Date   Acquired absence of left great toe ([place])    Acquired absence of right leg below knee (HCC)    Acute osteomyelitis of calcaneum, right ([place]) 10/02/2016   Acute respiratory failure (HCC)    Amputation stump infection ([place]) 08/12/2018   Anemia    Basal cell carcinoma, eyelid    Cancer (HCC)    Candidiasis 08/12/2018   CHF (congestive heart failure) (HCC)    chronic diastolic    Chronic back pain    Chronic kidney disease    stage 3    Chronic multifocal osteomyelitis (HCC)    of ankle and foot    Chronic pain syndrome    COPD (chronic obstructive pulmonary disease) (HCC)    DDD (degenerative disc disease), lumbar    Depression    major depressive disorder    Diabetes mellitus without complication ([place])    type 2    Diabetic ulcer of toe of left foot ([place]) 10/02/2016   Difficult intubation    Difficulty in walking, not elsewhere classified    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   Foot drop, bilateral    Foot drop, right 12/27/2015   GERD (gastroesophageal reflux disease)    Herpesviral vesicular dermatitis    History of COVID-19 03/15/2022   Hyperlipidemia    Hyperlipidemia    Hypertension    Insomnia    Malignant  melanoma of other parts of face ([place])    Melanoma ([place])    MRSA bacteremia    Muscle weakness (generalized)    Nasal congestion    Necrosis of toe ([place]) 07/08/2018   Neuromuscular dysfunction of bladder    Osteomyelitis ([place])    Osteomyelitis (HCC)    Osteomyelitis ([place])    right BKA   Other idiopathic peripheral autonomic neuropathy    Retention of urine, unspecified    Squamous cell carcinoma of skin    Unsteadiness on feet    Urinary retention    Urinary retention    Urinary tract infection    Wears dentures    Wears glasses    Patient Active Problem List   Diagnosis Date Noted   Shortness of breath 09/07/2022   Symptomatic anemia 09/05/2022   Chronic hypoxic respiratory failure ([place]) 09/05/2022   Respiratory failure ([place]) 08/22/2022   Pressure injury of skin 08/22/2022   Influenza A 08/22/2022   Sepsis ([place]) 08/22/2022   Pneumonia 05/01/2022   Right upper lobe pneumonia 09/05/2021   Hyponatremia 09/05/2021   Anxiety 09/05/2021   BPH (benign prostatic hyperplasia) 09/05/2021   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   Asymptomatic bacteriuria 06/30/2021   Normocytic anemia 06/29/2021   Pain of amputation stump of right lower extremity ([place]) 03/15/2021  Melanoma of cheek ([place]) 10/15/2019   Acute hypoxic respiratory failure ([place]) 07/11/2019   Acute respiratory disease due to COVID-19 virus 07/11/2019   Wound infection 06/22/2019   Abscess 06/22/2019   Stage 3b chronic kidney disease (CKD) ([place]) 05/23/2019   Tobacco use 05/23/2019   Cellulitis 05/23/2019   S/P AKA (above knee amputation) unilateral, right ([place]) 12/05/2018   Amputation stump infection ([place])    Lymphedema of left leg    Diabetic polyneuropathy associated with type 2 diabetes mellitus ([place])    Above knee amputation of right lower extremity ([place]) 10/22/2018   Dehiscence of amputation stump ([place])    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[place]) 08/12/2018   Candidiasis 08/12/2018   Necrosis of toe  ([place]) 07/08/2018   Amputated toe of left foot ([place]) 06/18/2018   Streptococcal infection    Pressure injury, stage 3 ([place]) 06/06/2018   Cutaneous abscess of left foot    Moderate protein-calorie malnutrition (HCC)    Cellulitis of left lower extremity    Septic arthritis ([place]) 06/03/2018   Idiopathic chronic venous hypertension of left lower extremity with ulcer and inflammation ([place]) 11/14/2017   Great toe amputation status, left 11/14/2017   Enlarged lymph node in neck 07/18/2017   Malignant melanoma of face ([place]) 04/16/2017   Osteomyelitis ([address] [address]) 03/25/2017   Skin cancer 03/25/2017   Depression 11/05/2016   Diabetic ulcer of toe of left foot ([place]) 10/02/2016   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   Chronic pain 09/27/2016   DM2 (diabetes mellitus, type 2) ([place]) 06/06/2016   GERD without esophagitis 05/11/2016   Hypertensive heart disease with congestive heart failure ([place]) 12/15/2015   Spinal stenosis in cervical region 12/15/2015   Type II diabetes mellitus with neurological manifestations ([place]) 12/15/2015   Chronic diastolic HF (heart failure) (HCC)    Pulmonary hypertension (HCC)    HTN (hypertension)    HLD (hyperlipidemia)    Urinary retention    Diskitis    Foot drop, bilateral    Sepsis with acute renal failure without septic shock (HCC)    COPD (chronic obstructive pulmonary disease) ([place]) 10/03/2015   Acute osteomyelitis of left foot ([place]) 10/03/2015   MRSA bacteremia 08/13/2015   Chronic low back pain 08/13/2015   Chronic multifocal osteomyelitis, multiple sites ([place]) 08/13/2015   Community acquired pneumonia 08/13/2015   Home Medication(s) Prior to Admission medications   Medication Sig Start Date End Date Taking? Authorizing Provider  Amino Acids-Protein Hydrolys (PRO-STAT) LIQD Take 30 mLs by mouth in the morning.    [provider]  amoxicillin-clavulanate (AUGMENTIN) 875-125 MG tablet Take 1 tablet by mouth 2 (two) times daily for 7 days. 09/07/22 09/14/22   Rai, Vernelle Emerald, MD  cetirizine (ZYRTEC) 10 MG tablet Take 10 mg by mouth daily.    [provider]  cyanocobalamin 1000 MCG tablet Take 1,000 mcg by mouth daily.    [provider]  docusate sodium (COLACE) 100 MG capsule Take 1 capsule (100 mg total) by mouth 2 (two) times daily. 05/31/19   Amin, Jeanella Flattery, MD  doxycycline (DORYX) 100 MG EC tablet Take 1 tablet (100 mg total) by mouth 2 (two) times daily for 7 days. 09/07/22 09/14/22  Rai, Vernelle Emerald, MD  fenofibrate (TRICOR) 48 MG tablet Take 48 mg by mouth daily.    [provider]  ferrous sulfate 325 (65 FE) MG tablet Take 1 tablet (325 mg total) by mouth daily with breakfast. 11/23/18   Matilde Haymaker, MD  fluticasone [facility]) 50 MCG/ACT nasal  spray Place 1 spray into both nostrils in the morning.    [provider]  furosemide (LASIX) 40 MG tablet Take 1 tablet (40 mg total) by mouth daily. 09/07/22   Rai, Ripudeep K, MD  Glucagon, rDNA, (GLUCAGON EMERGENCY) 1 MG KIT Inject 1 mg into the vein as needed (for hypolycemia).    [provider]  insulin glargine (LANTUS SOLOSTAR) 100 UNIT/ML Solostar Pen Inject 15 Units into the skin daily. 09/07/22   Rai, Ripudeep K, MD  insulin lispro (HUMALOG) 100 UNIT/ML KwikPen Inject 0-10 Units into the skin See admin instructions. Inject 0-10 units into the skin three times a day and at bedtime, PER SLIDING SCALE:  BGL 151-200 = 2 units 201-250 = 4 units 251-300 = 6 units 301-350 =  8 units 351-400 = 10 units Patient taking differently: Inject 3-20 Units into the skin See admin instructions. Inject 3-20 units into the skin before meals and at bedtime, PER SLIDING SCALE: BGL 121-150 = 3 units; 151-200 = 4 units; 201-250 = 7 units; 251-300 = 11 units; 301-350 = 15 units; 351-400 = 20 units; >400 = CALL MD 01/31/22   Terrilee Croak, MD  ipratropium-albuterol (DUONEB) 0.5-2.5 (3) MG/3ML SOLN Take 3 mLs by nebulization every 6 (six) hours as needed (for shortness of  breath).    [provider]  lactulose (CHRONULAC) 10 GM/15ML solution Take 15 mLs (10 g total) by mouth 3 (three) times daily as needed for moderate constipation. Patient taking differently: Take 10 g by mouth every 8 (eight) hours as needed for moderate constipation. 01/31/22   Terrilee Croak, MD  leptospermum manuka honey (MEDIHONEY) PSTE paste Apply 1 Application topically daily. 09/08/22   Rai, Ripudeep Raliegh Ip, MD  Lidocaine 4 % PTCH Apply 1 patch topically See admin instructions. Apply 1 patch to both the right shoulder and the right stump at 9 AM daily- remove at 9 PM    [provider]  LORazepam (ATIVAN) 0.5 MG tablet Take 1 tablet (0.5 mg total) by mouth every 6 (six) hours as needed for anxiety. 09/07/22   Rai, Ripudeep K, MD  magnesium oxide (MAG-OX) 400 MG tablet Take 400 mg by mouth 2 (two) times daily.    [provider]  melatonin 3 MG TABS tablet Take 3 mg by mouth at bedtime.    [provider]  Menthol, Topical Analgesic, (BIOFREEZE) 4 % GEL Apply 1 Application topically See admin instructions. Apply to right leg/stump every 8 hours as needed for pain    [provider]  Meth-Hyo-M Bl-Na Phos-Ph Sal (URIBEL) 118 MG CAPS Take 1 capsule by mouth every 8 (eight) hours as needed (bladder pain).    [provider]  methenamine (MANDELAMINE) 1 g tablet Take 1 g by mouth daily.    [provider]  metoprolol tartrate (LOPRESSOR) 25 MG tablet Take 0.5 tablets (12.5 mg total) by mouth 2 (two) times daily. 07/18/19 09/06/22  Elodia Florence., MD  mirabegron ER (MYRBETRIQ) 50 MG TB24 tablet Take 50 mg by mouth daily.    [provider]  Multiple Vitamins-Minerals (MULTIVITAMIN WITH MINERALS) tablet Take 1 tablet by mouth daily.    [provider]  Multiple Vitamins-Minerals (PRESERVISION AREDS 2) CAPS Take 1 capsule by mouth in the morning and at bedtime.    [provider]  naloxone [facility]) 4 MG/0.1ML LIQD  nasal spray kit Place 1 spray into the nose as needed (poorly responsive or turning blue).    [provider]  nystatin cream (MYCOSTATIN) Apply 1 Application topically See admin instructions. Apply to wound site every day shift and as needed, for wound care    [provider]  omeprazole (PRILOSEC) 20 MG capsule Take 20 mg by mouth daily.    [provider]  ondansetron (ZOFRAN) 4 MG tablet Take 4 mg by mouth every 8 (eight) hours as needed for nausea.    [provider]  oxybutynin (DITROPAN-XL) 10 MG 24 hr tablet Take 10 mg by mouth daily.    [provider]  Oxycodone HCl 20 MG TABS Take 1 tablet (20 mg total) by mouth every 8 (eight) hours as needed (severe pain). 09/07/22   Rai, Vernelle Emerald, MD  OXYGEN Inhale 2 L/min into the lungs as needed (for shortness of breath- to maintain sats >90%).    [provider]  oxymetazoline (AFRIN 12 HOUR) 0.05 % nasal spray Place 1 spray into both nostrils every 12 (twelve) hours as needed for congestion.    [provider]  Polyethyl Glycol-Propyl Glycol (LUBRICANT EYE DROPS) 0.4-0.3 % SOLN Place 2 drops into both eyes every 4 (four) hours as needed (dryness/itching).    [provider]  polyethylene glycol (MIRALAX / GLYCOLAX) 17 g packet Take 17 g by mouth daily as needed for mild constipation.    [provider]  rosuvastatin (CRESTOR) 10 MG tablet Take 10 mg by mouth at bedtime.    [provider]  senna (SENOKOT) 8.6 MG TABS tablet Take 1 tablet by mouth at bedtime.    [provider]  SPIRIVA RESPIMAT 1.25 MCG/ACT AERS Inhale 1 each into the lungs in the morning.    [provider]  tamsulosin (FLOMAX) 0.4 MG CAPS capsule Take 0.8 mg by mouth daily. 11/26/13   [provider]  TRULICITY [id number] SOPN Inject 0.75 mg into the skin every Wednesday.    [provider]                                                                                                                                     Past Surgical History Past Surgical History:  Procedure Laterality Date   AMPUTATION Left 03/29/2017   Procedure: LEFT GREAT TOE AMPUTATION AT METATARSOPHALANGEAL JOINT;  Surgeon: [name], MD;  Location: [facility];  Service: Orthopedics;  Laterality: Left;   AMPUTATION Right 03/29/2017   Procedure: RIGHT BELOW KNEE AMPUTATION;  Surgeon: [name], MD;  Location: [facility];  Service: Orthopedics;  Laterality: Right;   AMPUTATION Left 06/06/2018   Procedure: LEFT 2ND TOE AMPUTATION;  Surgeon: [name], MD;  Location: [facility];  Service: Orthopedics;  Laterality: Left;   AMPUTATION Right 11/19/2018   Procedure: AMPUTATION ABOVE KNEE;  Surgeon: [name], MD;  Location: [facility];  Service: Orthopedics;  Laterality: Right;   ANTERIOR CERVICAL CORPECTOMY N/A 11/25/2015   Procedure: ANTERIOR CERVICAL FIVE CORPECTOMY Cervical four -  six fusion;  Surgeon: Consuella Lose, MD;  Location: [facility];  Service: Neurosurgery;  Laterality: N/A;  ANTERIOR CERVICAL FIVE CORPECTOMY Cervical four - six fusion   APPLICATION OF WOUND VAC Right 10/22/2018   Procedure: Application Of Wound Vac;  Surgeon: [name], MD;  Location: [facility];  Service: Orthopedics;  Laterality: Right;   CYSTOSCOPY WITH BIOPSY N/A 05/01/2022   Procedure: CYSTOSCOPY WITH URETHRAL BIOPSY;  Surgeon: Robley Fries, MD;  [facility];  Service: Urology;  Laterality: N/A;  1 HR   CYSTOSCOPY WITH RETROGRADE URETHROGRAM N/A 04/25/2018   Procedure: CYSTOSCOPY WITH RETROGRADE URETHROGRAM/ BALLOON DILATION;  Surgeon: Kathie Rhodes, MD;  [facility];  Service: Urology;  Laterality: N/A;   CYSTOSCOPY WITH URETHRAL DILATATION N/A 05/01/2022   Procedure: BALLOON DILATION WITH  OPTILUME;  Surgeon: Robley Fries, MD;  [facility];  Service: Urology;  Laterality: N/A;   MULTIPLE TOOTH EXTRACTIONS     POSTERIOR CERVICAL FUSION/FORAMINOTOMY N/A 11/29/2015    Procedure: Cervical Three-Cervical Seven Posterior Cervical Laminectomy with Fusion;  Surgeon: Consuella Lose, MD;  Location: [facility];  Service: Neurosurgery;  Laterality: N/A;  Cervical Three-Cervical Seven Posterior Cervical Laminectomy with Fusion   STUMP REVISION Right 10/22/2018   Procedure: REVISION RIGHT BELOW KNEE AMPUTATION;  Surgeon: [name], MD;  Location: [place];  Service: Orthopedics;  Laterality: Right;   STUMP REVISION Right 12/05/2018   Procedure: Revision Right Above Knee Amputation;  Surgeon: [name], MD;  Location: [place] [facility];  Service: Orthopedics;  Laterality: Right;   STUMP REVISION Right 05/29/2019   Procedure: REVISION RIGHT ABOVE KNEE AMPUTATION;  Surgeon: [name], MD;  Location: [facility];  Service: Orthopedics;  Laterality: Right;   STUMP REVISION Right 06/24/2019   Procedure: STUMP REVISION;  Surgeon: [name], MD;  Location: [facility];  Service: Orthopedics;  Laterality: Right;   Family History Family History  Problem Relation Age of Onset   Diabetes Mother    Heart disease Mother    Cancer Mother    Cancer Father    Bone cancer Father    Prostate cancer Father    Cancer Paternal Grandfather    Cancer Other    Diabetes Other     Social History Social History   Tobacco Use   Smoking status: Every Day    Packs/day: .5    Types: Cigarettes   Smokeless tobacco: Never   Tobacco comments:    11/19/18  Vaping Use   Vaping Use: Never used  Substance Use Topics   Alcohol use: No   Drug use: No   Allergies Latex  Review of Systems Review of Systems  All other systems reviewed and are negative.   Physical Exam Vital Signs  I have reviewed the triage vital signs BP 127/72   Pulse 70   Temp 98.2 F (36.8 C)   Resp 13   SpO2 100%  Physical Exam Vitals and nursing note reviewed.  Constitutional:      General: He is not in acute distress.    Appearance: Normal appearance.  HENT:     Mouth/Throat:     Mouth: Mucous membranes  are dry.  Eyes:     Conjunctiva/sclera: Conjunctivae normal.  Cardiovascular:     Rate and Rhythm: Normal rate and regular rhythm.  Pulmonary:     Effort: Pulmonary effort is normal. No respiratory distress.     Breath sounds: Examination of the left-lower field reveals rhonchi and rales. Rhonchi  and rales present.  Abdominal:     General: Abdomen is flat.     Palpations: Abdomen is soft.     Tenderness: There is no abdominal tenderness.  Musculoskeletal:     Right lower leg: No edema.     Comments: Right BKA.  Left lower extremity with chronic lymphedematous changes. No tenderness or drainage. Mild erythema. No warmth  Skin:    General: Skin is warm and dry.     Capillary Refill: Capillary refill takes less than 2 seconds.  Neurological:     Mental Status: He is alert and oriented to person, place, and time. Mental status is at baseline.  Psychiatric:        Mood and Affect: Mood normal.        Behavior: Behavior normal.     ED Results and Treatments Labs (all labs ordered are listed, but only abnormal results are displayed) Labs Reviewed  COMPREHENSIVE METABOLIC PANEL - Abnormal; Notable for the following components:      Result Value   Sodium 134 (*)    Chloride 97 (*)    Glucose, Bld 174 (*)    Creatinine, Ser 1.96 (*)    Albumin 3.0 (*)    GFR, Estimated 38 (*)    All other components within normal limits  CBC WITH DIFFERENTIAL/PLATELET - Abnormal; Notable for the following components:   RBC 3.81 (*)    Hemoglobin 9.9 (*)    HCT 32.9 (*)    All other components within normal limits  I-STAT VENOUS BLOOD GAS, ED - Abnormal; Notable for the following components:   pO2, Ven 62 (*)    HCT 33.0 (*)    Hemoglobin 11.2 (*)    All other components within normal limits  PROTIME-INR  BRAIN NATRIURETIC PEPTIDE                                                                                                                          Radiology DG Chest Port 1 View  Result  Date: 09/13/2022 CLINICAL DATA:  Cough. EXAM: PORTABLE CHEST 1 VIEW COMPARISON:  September 05, 2022. FINDINGS: Stable cardiomediastinal silhouette. Both lungs are clear. The visualized skeletal structures are unremarkable. IMPRESSION: No active disease. Electronically Signed   By: Marijo Conception M.D.   On: 09/13/2022 08:59    Pertinent labs & imaging results that were available during my care of the patient were reviewed by me and considered in my medical decision making (see MDM for details).  Medications Ordered in ED Medications  LORazepam (ATIVAN) tablet 0.5 mg (has no administration in time range)  lactated ringers bolus 500 mL (500 mLs Intravenous New Bag/Given 09/13/22 0903)  ondansetron (ZOFRAN) injection 4 mg (4 mg Intravenous Given 09/13/22 0906)  oxyCODONE-acetaminophen (PERCOCET/ROXICET) 5-325 MG per tablet 2 tablet (2 tablets Oral Given 09/13/22 0853)  LORazepam (ATIVAN) tablet 0.5 mg (0.5 mg Oral Given 09/13/22 0915)  oxyCODONE (Oxy IR/ROXICODONE) immediate release tablet 10 mg (10 mg Oral Given  09/13/22 1034)                                                                                                                                     Procedures Procedures  (including critical care time)  Medical Decision Making / ED Course   MDM:  62 year old male presenting to the emergency department with nausea.  Patient overall well-appearing, does appear mildly dehydrated.  Pulmonary exam with left lower lobe lung sounds.  No respiratory distress or hypoxia.  Differential includes infectious process such as pneumonia, will check chest x-ray.  Patient denies any other focal infectious symptoms besides productive cough.  No abdominal tenderness or abdominal pain to suggest intra-abdominal process such as pancreatitis, cholecystitis.  Will check labs to evaluate for toxic/metabolic causes of nausea.  Will also check EKG although patient denies any chest pain to suggest ACS.  Per nursing  facility, he has had some decreased doses of his Ativan and oxycodone recently which could also cause nausea, he reports his chronic pain is at baseline.  Clinically does not appear to have any signs of acute opioid or benzodiazepine withdrawal at this time.  He has chronic venous stasis changes to his left lower extremity, no drainage, pitting edema, tenderness, warmth to suggest acute cellulitis.  patient appears mildly dehydrated so we will give some IV fluids.  Will reassess.  Clinical Course as of 09/13/22 1117  Thu Sep 13, 2022  1055 Labs overall reassuring.  Chest x-ray clear.  No leukocytosis.  Pulmonary findings possibly due to bronchitis.  No hypercapnia or shortness of breath to suggest COPD exacerbation.  Patient has frequently demanded pain medication and anxiety medication.  He on further history he also admits that the reason he came here was because he is not getting enough pain and anxiety medication at his facility.  Given reassuring workup Will discharge patient to facilty. Return precautions listed on AVS.   [WS]    Clinical Course User Index [WS] Cristie Hem, MD     Additional history obtained: -Additional history obtained from ems -External records from outside source obtained and reviewed including: Chart review including previous notes, labs, imaging, consultation notes including admission 09/07/22   Lab Tests: -I ordered, reviewed, and interpreted labs.   The pertinent results include:   Labs Reviewed  COMPREHENSIVE METABOLIC PANEL - Abnormal; Notable for the following components:      Result Value   Sodium 134 (*)    Chloride 97 (*)    Glucose, Bld 174 (*)    Creatinine, Ser 1.96 (*)    Albumin 3.0 (*)    GFR, Estimated 38 (*)    All other components within normal limits  CBC WITH DIFFERENTIAL/PLATELET - Abnormal; Notable for the following components:   RBC 3.81 (*)    Hemoglobin 9.9 (*)    HCT 32.9 (*)    All other components within normal limits   I-STAT VENOUS  BLOOD GAS, ED - Abnormal; Notable for the following components:   pO2, Ven 62 (*)    HCT 33.0 (*)    Hemoglobin 11.2 (*)    All other components within normal limits  PROTIME-INR  BRAIN NATRIURETIC PEPTIDE    Notable for no leukocytosis, no hypercapnea, chronic kidney  disease  EKG   EKG Interpretation  Date/Time:  Thursday September 13 2022 10:46:49 EDT Ventricular Rate:  73 PR Interval:  203 QRS Duration: 110 QT Interval:  400 QTC Calculation: 441 R Axis:   92 Text Interpretation: Sinus rhythm Atrial premature complex Right axis deviation Confirmed by Garnette Gunner [phone]) on 09/13/2022 11:16:02 AM         Imaging Studies ordered: I ordered imaging studies including CXR On my interpretation imaging demonstrates no pneumonia I independently visualized and interpreted imaging. I agree with the radiologist interpretation   Medicines ordered and prescription drug management: Meds ordered this encounter  Medications   lactated ringers bolus 500 mL   ondansetron (ZOFRAN) injection 4 mg   oxyCODONE-acetaminophen (PERCOCET/ROXICET) 5-325 MG per tablet 2 tablet   LORazepam (ATIVAN) tablet 0.5 mg   oxyCODONE (Oxy IR/ROXICODONE) immediate release tablet 10 mg   LORazepam (ATIVAN) tablet 0.5 mg    -I have reviewed the patients home medicines and have made adjustments as needed  Cardiac Monitoring: The patient was maintained on a cardiac monitor.  I personally viewed and interpreted the cardiac monitored which showed an underlying rhythm of: NSR  Social Determinants of Health:  Diagnosis or treatment significantly limited by social determinants of health: obesity   Reevaluation: After the interventions noted above, I reevaluated the patient and found that their symptoms have improved  Co morbidities that complicate the patient evaluation  Past Medical History:  Diagnosis Date   Acquired absence of left great toe ([place])    Acquired absence of right leg  below knee ([place])    Acute osteomyelitis of calcaneum, right ([place]) 10/02/2016   Acute respiratory failure (HCC)    Amputation stump infection ([place]) 08/12/2018   Anemia    Basal cell carcinoma, eyelid    Cancer ([place])    Candidiasis 08/12/2018   CHF (congestive heart failure) (HCC)    chronic diastolic    Chronic back pain    Chronic kidney disease    stage 3    Chronic multifocal osteomyelitis (HCC)    of ankle and foot    Chronic pain syndrome    COPD (chronic obstructive pulmonary disease) ([place])    DDD (degenerative disc disease), lumbar    Depression    major depressive disorder    Diabetes mellitus without complication ([place])    type 2    Diabetic ulcer of toe of left foot ([place]) 10/02/2016   Difficult intubation    Difficulty in walking, not elsewhere classified    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   Foot drop, bilateral    Foot drop, right 12/27/2015   GERD (gastroesophageal reflux disease)    Herpesviral vesicular dermatitis    History of COVID-19 03/15/2022   Hyperlipidemia    Hyperlipidemia    Hypertension    Insomnia    Malignant melanoma of other parts of face ([place])    Melanoma ([place])    MRSA bacteremia    Muscle weakness (generalized)    Nasal congestion    Necrosis of toe ([place]) 07/08/2018   Neuromuscular dysfunction of bladder    Osteomyelitis ([place])    Osteomyelitis ([place])    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    Final Clinical Impression(s) / ED Diagnoses Final diagnoses:  Nausea     This chart was dictated using voice recognition software.  Despite best efforts to  proofread,  errors can occur which can change the documentation meaning.    Cristie Hem, MD 09/13/22 [phone]

## 2022-09-13 NOTE — ED Notes (Signed)
Truett Mainland, MD at bedside.

## 2022-09-13 NOTE — ED Notes (Signed)
Patient yelling and requesting the provider again, right after he just left his bedside, Truett Mainland, MD notified.

## 2022-09-13 NOTE — Discharge Instructions (Addendum)
We did not find a dangerous cause of your symptoms in the emergency department today.  Please return if you develop any fevers, abdominal pain, chest pain, lightheadedness or dizziness, fainting, or any other concerning symptoms.

## 2022-09-13 NOTE — ED Triage Notes (Signed)
PT BIB EMS from [facility], pt states they have took him off of his Ativan and decreased his Oxycodone 30 mg to 20 mg, and not he has been nauseated for a couple of days.

## 2022-09-13 NOTE — ED Notes (Signed)
Patient has continuously called out for nausea, pain, and anxiety medication, even after he has received it, states it's not enough and he wants all 3 at the same time.  Truett Mainland, MD notified.

## 2022-09-13 NOTE — ED Notes (Signed)
Report to [facility], Therapist, sports, while pt is waiting for [facility].

## 2022-09-13 NOTE — ED Notes (Signed)
[facility] called, eta "maybe an hour or so"

## 2022-09-18 ENCOUNTER — Inpatient Hospital Stay (HOSPITAL_COMMUNITY): Payer: Medicaid Other

## 2022-09-18 ENCOUNTER — Other Ambulatory Visit: Payer: Self-pay

## 2022-09-18 ENCOUNTER — Inpatient Hospital Stay (HOSPITAL_COMMUNITY)
Admission: EM | Admit: 2022-09-18 | Discharge: 2022-09-21 | DRG: 065 | Disposition: A | Discharge status: Skilled Nursing Facility | Payer: Medicaid Other | Source: Skilled Nursing Facility | Attending: Internal Medicine | Admitting: Internal Medicine

## 2022-09-18 ENCOUNTER — Encounter (HOSPITAL_COMMUNITY): Payer: Self-pay

## 2022-09-18 ENCOUNTER — Emergency Department (HOSPITAL_COMMUNITY): Payer: Medicaid Other

## 2022-09-18 DIAGNOSIS — Z8616 Personal history of COVID-19: Secondary | ICD-10-CM | POA: Diagnosis not present

## 2022-09-18 DIAGNOSIS — Z91040 Latex allergy status: Secondary | ICD-10-CM

## 2022-09-18 DIAGNOSIS — E11622 Type 2 diabetes mellitus with other skin ulcer: Secondary | ICD-10-CM | POA: Diagnosis present

## 2022-09-18 DIAGNOSIS — E785 Hyperlipidemia, unspecified: Secondary | ICD-10-CM | POA: Diagnosis present

## 2022-09-18 DIAGNOSIS — H53462 Homonymous bilateral field defects, left side: Secondary | ICD-10-CM | POA: Diagnosis present

## 2022-09-18 DIAGNOSIS — I130 Hypertensive heart and chronic kidney disease with heart failure and stage 1 through stage 4 chronic kidney disease, or unspecified chronic kidney disease: Secondary | ICD-10-CM | POA: Diagnosis present

## 2022-09-18 DIAGNOSIS — Z85828 Personal history of other malignant neoplasm of skin: Secondary | ICD-10-CM

## 2022-09-18 DIAGNOSIS — I6389 Other cerebral infarction: Secondary | ICD-10-CM | POA: Diagnosis not present

## 2022-09-18 DIAGNOSIS — F17210 Nicotine dependence, cigarettes, uncomplicated: Secondary | ICD-10-CM | POA: Diagnosis present

## 2022-09-18 DIAGNOSIS — N1832 Chronic kidney disease, stage 3b: Secondary | ICD-10-CM | POA: Diagnosis present

## 2022-09-18 DIAGNOSIS — H547 Unspecified visual loss: Secondary | ICD-10-CM

## 2022-09-18 DIAGNOSIS — Z85820 Personal history of malignant melanoma of skin: Secondary | ICD-10-CM

## 2022-09-18 DIAGNOSIS — E860 Dehydration: Secondary | ICD-10-CM | POA: Diagnosis present

## 2022-09-18 DIAGNOSIS — Z981 Arthrodesis status: Secondary | ICD-10-CM

## 2022-09-18 DIAGNOSIS — D649 Anemia, unspecified: Secondary | ICD-10-CM | POA: Diagnosis present

## 2022-09-18 DIAGNOSIS — I639 Cerebral infarction, unspecified: Secondary | ICD-10-CM

## 2022-09-18 DIAGNOSIS — J4489 Other specified chronic obstructive pulmonary disease: Secondary | ICD-10-CM | POA: Diagnosis present

## 2022-09-18 DIAGNOSIS — Z89611 Acquired absence of right leg above knee: Secondary | ICD-10-CM

## 2022-09-18 DIAGNOSIS — L97321 Non-pressure chronic ulcer of left ankle limited to breakdown of skin: Secondary | ICD-10-CM

## 2022-09-18 DIAGNOSIS — N319 Neuromuscular dysfunction of bladder, unspecified: Secondary | ICD-10-CM | POA: Diagnosis present

## 2022-09-18 DIAGNOSIS — R2990 Unspecified symptoms and signs involving the nervous system: Secondary | ICD-10-CM | POA: Diagnosis not present

## 2022-09-18 DIAGNOSIS — E1151 Type 2 diabetes mellitus with diabetic peripheral angiopathy without gangrene: Secondary | ICD-10-CM | POA: Diagnosis present

## 2022-09-18 DIAGNOSIS — Z765 Malingerer [conscious simulation]: Secondary | ICD-10-CM

## 2022-09-18 DIAGNOSIS — J9611 Chronic respiratory failure with hypoxia: Secondary | ICD-10-CM | POA: Diagnosis present

## 2022-09-18 DIAGNOSIS — Z79899 Other long term (current) drug therapy: Secondary | ICD-10-CM

## 2022-09-18 DIAGNOSIS — Z6838 Body mass index (BMI) 38.0-38.9, adult: Secondary | ICD-10-CM

## 2022-09-18 DIAGNOSIS — I4510 Unspecified right bundle-branch block: Secondary | ICD-10-CM | POA: Diagnosis present

## 2022-09-18 DIAGNOSIS — R609 Edema, unspecified: Secondary | ICD-10-CM | POA: Diagnosis not present

## 2022-09-18 DIAGNOSIS — J449 Chronic obstructive pulmonary disease, unspecified: Secondary | ICD-10-CM | POA: Diagnosis not present

## 2022-09-18 DIAGNOSIS — H539 Unspecified visual disturbance: Secondary | ICD-10-CM | POA: Diagnosis not present

## 2022-09-18 DIAGNOSIS — S78111A Complete traumatic amputation at level between right hip and knee, initial encounter: Secondary | ICD-10-CM | POA: Diagnosis not present

## 2022-09-18 DIAGNOSIS — I63531 Cerebral infarction due to unspecified occlusion or stenosis of right posterior cerebral artery: Principal | ICD-10-CM | POA: Diagnosis present

## 2022-09-18 DIAGNOSIS — E1149 Type 2 diabetes mellitus with other diabetic neurological complication: Secondary | ICD-10-CM | POA: Diagnosis present

## 2022-09-18 DIAGNOSIS — G894 Chronic pain syndrome: Secondary | ICD-10-CM | POA: Diagnosis present

## 2022-09-18 DIAGNOSIS — Z8249 Family history of ischemic heart disease and other diseases of the circulatory system: Secondary | ICD-10-CM

## 2022-09-18 DIAGNOSIS — K5900 Constipation, unspecified: Secondary | ICD-10-CM | POA: Diagnosis not present

## 2022-09-18 DIAGNOSIS — Z89421 Acquired absence of other right toe(s): Secondary | ICD-10-CM

## 2022-09-18 DIAGNOSIS — E1165 Type 2 diabetes mellitus with hyperglycemia: Secondary | ICD-10-CM | POA: Diagnosis present

## 2022-09-18 DIAGNOSIS — E669 Obesity, unspecified: Secondary | ICD-10-CM | POA: Diagnosis present

## 2022-09-18 DIAGNOSIS — Z794 Long term (current) use of insulin: Secondary | ICD-10-CM | POA: Diagnosis not present

## 2022-09-18 DIAGNOSIS — E1122 Type 2 diabetes mellitus with diabetic chronic kidney disease: Secondary | ICD-10-CM | POA: Diagnosis present

## 2022-09-18 DIAGNOSIS — Z7985 Long-term (current) use of injectable non-insulin antidiabetic drugs: Secondary | ICD-10-CM

## 2022-09-18 DIAGNOSIS — I10 Essential (primary) hypertension: Secondary | ICD-10-CM

## 2022-09-18 DIAGNOSIS — Z720 Tobacco use: Secondary | ICD-10-CM | POA: Diagnosis present

## 2022-09-18 DIAGNOSIS — Z833 Family history of diabetes mellitus: Secondary | ICD-10-CM

## 2022-09-18 DIAGNOSIS — Z89411 Acquired absence of right great toe: Secondary | ICD-10-CM

## 2022-09-18 DIAGNOSIS — I5032 Chronic diastolic (congestive) heart failure: Secondary | ICD-10-CM | POA: Diagnosis present

## 2022-09-18 DIAGNOSIS — Z993 Dependence on wheelchair: Secondary | ICD-10-CM

## 2022-09-18 DIAGNOSIS — L97909 Non-pressure chronic ulcer of unspecified part of unspecified lower leg with unspecified severity: Secondary | ICD-10-CM | POA: Diagnosis not present

## 2022-09-18 DIAGNOSIS — Z9981 Dependence on supplemental oxygen: Secondary | ICD-10-CM

## 2022-09-18 DIAGNOSIS — D631 Anemia in chronic kidney disease: Secondary | ICD-10-CM | POA: Diagnosis present

## 2022-09-18 DIAGNOSIS — Z8614 Personal history of Methicillin resistant Staphylococcus aureus infection: Secondary | ICD-10-CM

## 2022-09-18 DIAGNOSIS — N183 Chronic kidney disease, stage 3 unspecified: Secondary | ICD-10-CM | POA: Diagnosis present

## 2022-09-18 DIAGNOSIS — Z8042 Family history of malignant neoplasm of prostate: Secondary | ICD-10-CM

## 2022-09-18 DIAGNOSIS — R29810 Facial weakness: Secondary | ICD-10-CM | POA: Diagnosis present

## 2022-09-18 DIAGNOSIS — K219 Gastro-esophageal reflux disease without esophagitis: Secondary | ICD-10-CM | POA: Diagnosis present

## 2022-09-18 DIAGNOSIS — R29701 NIHSS score 1: Secondary | ICD-10-CM | POA: Diagnosis present

## 2022-09-18 LAB — CBC WITH DIFFERENTIAL/PLATELET
Abs Immature Granulocytes: 0.05 10*3/uL (ref 0.00–0.07)
Basophils Absolute: 0.1 10*3/uL (ref 0.0–0.1)
Basophils Relative: 1 %
Eosinophils Absolute: 0.2 10*3/uL (ref 0.0–0.5)
Eosinophils Relative: 2 %
HCT: 34.7 % — ABNORMAL LOW (ref 39.0–52.0)
Hemoglobin: 10.5 g/dL — ABNORMAL LOW (ref 13.0–17.0)
Immature Granulocytes: 1 %
Lymphocytes Relative: 27 %
Lymphs Abs: 1.9 10*3/uL (ref 0.7–4.0)
MCH: 26.3 pg (ref 26.0–34.0)
MCHC: 30.3 g/dL (ref 30.0–36.0)
MCV: 87 fL (ref 80.0–100.0)
Monocytes Absolute: 0.5 10*3/uL (ref 0.1–1.0)
Monocytes Relative: 8 %
Neutro Abs: 4.2 10*3/uL (ref 1.7–7.7)
Neutrophils Relative %: 61 %
Platelets: 448 10*3/uL — ABNORMAL HIGH (ref 150–400)
RBC: 3.99 MIL/uL — ABNORMAL LOW (ref 4.22–5.81)
RDW: 16.1 % — ABNORMAL HIGH (ref 11.5–15.5)
WBC: 6.8 10*3/uL (ref 4.0–10.5)
nRBC: 0 % (ref 0.0–0.2)

## 2022-09-18 LAB — LIPASE, BLOOD: Lipase: 24 U/L (ref 11–51)

## 2022-09-18 LAB — COMPREHENSIVE METABOLIC PANEL
ALT: 10 U/L (ref 0–44)
AST: 24 U/L (ref 15–41)
Albumin: 3.1 g/dL — ABNORMAL LOW (ref 3.5–5.0)
Alkaline Phosphatase: 63 U/L (ref 38–126)
Anion gap: 8 (ref 5–15)
BUN: 25 mg/dL — ABNORMAL HIGH (ref 8–23)
CO2: 22 mmol/L (ref 22–32)
Calcium: 8.9 mg/dL (ref 8.9–10.3)
Chloride: 104 mmol/L (ref 98–111)
Creatinine, Ser: 2.1 mg/dL — ABNORMAL HIGH (ref 0.61–1.24)
GFR, Estimated: 35 mL/min — ABNORMAL LOW (ref >60)
Glucose, Bld: 203 mg/dL — ABNORMAL HIGH (ref 70–99)
Potassium: 4.6 mmol/L (ref 3.5–5.1)
Sodium: 134 mmol/L — ABNORMAL LOW (ref 135–145)
Total Bilirubin: 0.7 mg/dL (ref 0.3–1.2)
Total Protein: 7.4 g/dL (ref 6.5–8.1)

## 2022-09-18 LAB — OSMOLALITY: Osmolality: 308 mOsm/kg — ABNORMAL HIGH (ref 275–295)

## 2022-09-18 LAB — GLUCOSE, CAPILLARY: Glucose-Capillary: 193 mg/dL — ABNORMAL HIGH (ref 70–99)

## 2022-09-18 LAB — CBG MONITORING, ED: Glucose-Capillary: 171 mg/dL — ABNORMAL HIGH (ref 70–99)

## 2022-09-18 LAB — C-REACTIVE PROTEIN: CRP: <0.5 mg/dL (ref <1.0)

## 2022-09-18 LAB — SEDIMENTATION RATE: Sed Rate: 77 mm/hr — ABNORMAL HIGH (ref 0–16)

## 2022-09-18 MED ORDER — ACETAMINOPHEN 325 MG PO TABS
650.0000 mg | ORAL_TABLET | ORAL | Status: DC | PRN
  Administered 2022-09-18 – 2022-09-19 (×2): 650 mg via ORAL
  Filled 2022-09-18 (×2): qty 2

## 2022-09-18 MED ORDER — MELATONIN 3 MG PO TABS
3.0000 mg | ORAL_TABLET | Freq: Every day | ORAL | Status: DC
  Administered 2022-09-18 – 2022-09-20 (×3): 3 mg via ORAL
  Filled 2022-09-18 (×3): qty 1

## 2022-09-18 MED ORDER — SODIUM CHLORIDE 0.9 % IV SOLN: INTRAVENOUS | Status: DC

## 2022-09-18 MED ORDER — ASPIRIN 81 MG PO TBEC
81.0000 mg | DELAYED_RELEASE_TABLET | Freq: Every day | ORAL | Status: DC
  Administered 2022-09-19 – 2022-09-21 (×3): 81 mg via ORAL
  Filled 2022-09-18 (×4): qty 1

## 2022-09-18 MED ORDER — INSULIN ASPART 100 UNIT/ML IJ SOLN
0.0000 [IU] | INTRAMUSCULAR | Status: DC
  Administered 2022-09-18: 2 [IU] via SUBCUTANEOUS
  Administered 2022-09-19 (×2): 1 [IU] via SUBCUTANEOUS
  Administered 2022-09-19: 2 [IU] via SUBCUTANEOUS
  Administered 2022-09-20: 1 [IU] via SUBCUTANEOUS
  Administered 2022-09-20 (×2): 2 [IU] via SUBCUTANEOUS
  Administered 2022-09-20: 3 [IU] via SUBCUTANEOUS
  Administered 2022-09-21 (×2): 2 [IU] via SUBCUTANEOUS
  Administered 2022-09-21 (×2): 1 [IU] via SUBCUTANEOUS
  Administered 2022-09-21: 2 [IU] via SUBCUTANEOUS

## 2022-09-18 MED ORDER — ASPIRIN 81 MG PO CHEW
81.0000 mg | CHEWABLE_TABLET | Freq: Once | ORAL | Status: AC
  Administered 2022-09-18: 81 mg via ORAL
  Filled 2022-09-18: qty 1

## 2022-09-18 MED ORDER — VANCOMYCIN HCL IN DEXTROSE 1-5 GM/200ML-% IV SOLN
1000.0000 mg | INTRAVENOUS | Status: DC
  Administered 2022-09-19: 1000 mg via INTRAVENOUS
  Filled 2022-09-18 (×2): qty 200

## 2022-09-18 MED ORDER — OXYCODONE HCL 5 MG PO TABS
20.0000 mg | ORAL_TABLET | Freq: Once | ORAL | Status: AC
  Administered 2022-09-18: 20 mg via ORAL
  Filled 2022-09-18: qty 4

## 2022-09-18 MED ORDER — NICOTINE 21 MG/24HR TD PT24
21.0000 mg | MEDICATED_PATCH | Freq: Every day | TRANSDERMAL | Status: DC
  Administered 2022-09-18 – 2022-09-21 (×4): 21 mg via TRANSDERMAL
  Filled 2022-09-18 (×4): qty 1

## 2022-09-18 MED ORDER — ONDANSETRON HCL 4 MG/2ML IJ SOLN
4.0000 mg | Freq: Once | INTRAMUSCULAR | Status: AC
  Administered 2022-09-18: 4 mg via INTRAVENOUS
  Filled 2022-09-18: qty 2

## 2022-09-18 MED ORDER — VANCOMYCIN HCL 1500 MG/300ML IV SOLN
1500.0000 mg | Freq: Once | INTRAVENOUS | Status: AC
  Administered 2022-09-18: 1500 mg via INTRAVENOUS
  Filled 2022-09-18: qty 300

## 2022-09-18 MED ORDER — DOCUSATE SODIUM 100 MG PO CAPS
100.0000 mg | ORAL_CAPSULE | Freq: Two times a day (BID) | ORAL | Status: DC
  Administered 2022-09-18 – 2022-09-21 (×6): 100 mg via ORAL
  Filled 2022-09-18 (×6): qty 1

## 2022-09-18 MED ORDER — LORAZEPAM 0.5 MG PO TABS
0.5000 mg | ORAL_TABLET | Freq: Four times a day (QID) | ORAL | Status: DC | PRN
  Administered 2022-09-18 – 2022-09-21 (×10): 0.5 mg via ORAL
  Filled 2022-09-18 (×10): qty 1

## 2022-09-18 MED ORDER — INSULIN GLARGINE-YFGN 100 UNIT/ML ~~LOC~~ SOLN
10.0000 [IU] | Freq: Every day | SUBCUTANEOUS | Status: DC
  Administered 2022-09-19 – 2022-09-20 (×2): 10 [IU] via SUBCUTANEOUS
  Filled 2022-09-18 (×4): qty 0.1

## 2022-09-18 MED ORDER — ATORVASTATIN CALCIUM 80 MG PO TABS
80.0000 mg | ORAL_TABLET | Freq: Every day | ORAL | Status: DC
  Administered 2022-09-18 – 2022-09-21 (×4): 80 mg via ORAL
  Filled 2022-09-18 (×4): qty 1

## 2022-09-18 MED ORDER — ACETAMINOPHEN 160 MG/5ML PO SOLN: 650.0000 mg | ORAL | Status: DC | PRN

## 2022-09-18 MED ORDER — IPRATROPIUM-ALBUTEROL 0.5-2.5 (3) MG/3ML IN SOLN: 3.0000 mL | Freq: Four times a day (QID) | RESPIRATORY_TRACT | Status: DC | PRN

## 2022-09-18 MED ORDER — ACETAMINOPHEN 650 MG RE SUPP: 650.0000 mg | RECTAL | Status: DC | PRN

## 2022-09-18 MED ORDER — SENNOSIDES-DOCUSATE SODIUM 8.6-50 MG PO TABS: 1.0000 | ORAL_TABLET | Freq: Every evening | ORAL | Status: DC | PRN

## 2022-09-18 MED ORDER — FLUTICASONE PROPIONATE 50 MCG/ACT NA SUSP
1.0000 | Freq: Every morning | NASAL | Status: DC
  Administered 2022-09-19 – 2022-09-21 (×3): 1 via NASAL
  Filled 2022-09-18: qty 16

## 2022-09-18 MED ORDER — LACTULOSE 10 GM/15ML PO SOLN: 10.0000 g | Freq: Three times a day (TID) | ORAL | Status: DC | PRN

## 2022-09-18 MED ORDER — METRONIDAZOLE 500 MG/100ML IV SOLN
500.0000 mg | Freq: Two times a day (BID) | INTRAVENOUS | Status: DC
  Administered 2022-09-18: 500 mg via INTRAVENOUS
  Filled 2022-09-18 (×2): qty 100

## 2022-09-18 MED ORDER — FENOFIBRATE 54 MG PO TABS
54.0000 mg | ORAL_TABLET | Freq: Every day | ORAL | Status: DC
  Administered 2022-09-19 – 2022-09-21 (×3): 54 mg via ORAL
  Filled 2022-09-18 (×5): qty 1

## 2022-09-18 MED ORDER — SODIUM CHLORIDE 0.9 % IV SOLN
2.0000 g | INTRAVENOUS | Status: DC
  Administered 2022-09-18 – 2022-09-21 (×3): 2 g via INTRAVENOUS
  Filled 2022-09-18 (×3): qty 20

## 2022-09-18 MED ORDER — SENNA 8.6 MG PO TABS
1.0000 | ORAL_TABLET | Freq: Every day | ORAL | Status: DC
  Administered 2022-09-18 – 2022-09-20 (×3): 8.6 mg via ORAL
  Filled 2022-09-18 (×3): qty 1

## 2022-09-18 MED ORDER — TAMSULOSIN HCL 0.4 MG PO CAPS
0.8000 mg | ORAL_CAPSULE | Freq: Every day | ORAL | Status: DC
  Administered 2022-09-18 – 2022-09-21 (×4): 0.8 mg via ORAL
  Filled 2022-09-18 (×4): qty 2

## 2022-09-18 MED ORDER — POLYETHYLENE GLYCOL 3350 17 G PO PACK: 17.0000 g | PACK | Freq: Every day | ORAL | Status: DC | PRN

## 2022-09-18 MED ORDER — OXYMETAZOLINE HCL 0.05 % NA SOLN
1.0000 | Freq: Two times a day (BID) | NASAL | Status: AC | PRN
  Administered 2022-09-19 – 2022-09-20 (×2): 1 via NASAL
  Filled 2022-09-18: qty 30

## 2022-09-18 MED ORDER — STROKE: EARLY STAGES OF RECOVERY BOOK
Freq: Once | Status: AC
  Filled 2022-09-18: qty 1

## 2022-09-18 MED ORDER — LORAZEPAM 1 MG PO TABS
1.0000 mg | ORAL_TABLET | Freq: Once | ORAL | Status: AC
  Administered 2022-09-18: 1 mg via ORAL
  Filled 2022-09-18: qty 1

## 2022-09-18 NOTE — ED Notes (Signed)
Patient transported to MRI 

## 2022-09-18 NOTE — Assessment & Plan Note (Signed)
Obtain KUB in order bowel regimen 

## 2022-09-18 NOTE — Assessment & Plan Note (Signed)
Chronic stable continue to follow CBC

## 2022-09-18 NOTE — Assessment & Plan Note (Addendum)
-   will admit based on TIA/CVA protocol,        Monitor on Tele Ct showing New hypodensities within the right occipital cortex and subcortical white matter, with an appearance most consistent with subacute to chronic ischemic changes.       MRA/MRI await  results           Carotid Doppler ordered        Echo to evaluate for possible embolic source,        obtain cardiac enzymes,  ECG,   Lipid panel, TSH.        Order PT/OT evaluation.          Passed swallow eval         Will make sure patient is on antiplatelet ASA 81  statin change to lipitor 80 mg po q day       Allow permissive Hypertension keep BP <220/120        Neurology consulted will see pt in ER

## 2022-09-18 NOTE — H&P (Signed)
Gregory [name] [id number] DOB: 05/19/1961 DOA: 09/18/2022     PCP: [name], MD   Outpatient Specialists:    Patient arrived to ER on  at  Referred by Attending Kemper Durie, DO   Patient coming from:     From [facility]  Chief Complaint:   Chief Complaint  Patient presents with   Loss of Vision    HPI: Gregory [name] is a 61 y.o. male with medical history significant of symptomatic anemia, chronic diastolic CHF, hypertension, DM 2, CKD stage IIIb, chronic hypoxic respiratory failure, COPD chronic hypoxic respiratory failure     Presented with loss of vision Patient presents from [facility] patient states that he cannot see his vision chronic back he was riding his motorized wheelchair when EMS arrived has history of prior visual loss secondary to a blood clot on patient states he has been having intermittent visual loss for the past few weeks but worse today Also endorses nausea foul-smelling urine states that he can only now see shapes no vomiting but does endorse abdominal discomfort which is diffuse otherwise no other localized neurological deficits no eye pain    Denies significant ETOH intake   Does not smoke  but not interested in quitting   Lab Results  Component Value Date   [place] 08/22/2022   [place] NEGATIVE 01/24/2022   [place] NEGATIVE 09/05/2021   [place] NEGATIVE 07/06/2021        Regarding pertinent Chronic problems:     Hyperlipidemia -  on statins  crestor Lipid Panel     Component Value Date/Time   CHOL 92 11/17/2018 0406   TRIG 143 07/11/2019 1023   HDL 19 (L) 11/17/2018 0406   CHOLHDL 4.8 11/17/2018 0406   VLDL 55 (H) 11/17/2018 0406   LDLCALC 18 11/17/2018 0406       chronic CHF diastolic - last echo  Recent Results (from the past 43800 hour(s))  ECHOCARDIOGRAM COMPLETE   Collection Time: 01/25/22 11:40 AM  Result Value   Weight 4,021.19   Height 72   BP 158/74   S'  Lateral 2.60   AR max vel 3.68   AV Area VTI 4.21   AV Mean grad 7.0   AV Peak grad 15.1   Ao pk vel 1.94   AV Area mean vel 4.06   Narrative      ECHOCARDIOGRAM REPORT       Patient Name:   Gregory [name] Date of Exam: 01/25/2022 Medical Rec #:  [id number]    Height:       72.0 in Accession #:    [id number]   Weight:       251.3 lb Date of Birth:  [birth year]   BSA:          2.348 m Patient Age:    50 years     BP:           144/64 mmHg Patient Gender: M            HR:           92 bpm. Exam Location:  Inpatient   IMPRESSIONS    1. Left ventricular ejection fraction, by estimation, is 55 to 60%. The left ventricle has normal function. The left ventricle has no regional wall motion abnormalities. There is mild left ventricular hypertrophy. Left ventricular diastolic parameters  are consistent with Grade I diastolic dysfunction (impaired relaxation).  2. Right ventricular systolic function is normal. The  right ventricular size is mildly enlarged. Tricuspid regurgitation signal is inadequate for assessing PA pressure.  3. The mitral valve is grossly normal. No evidence of mitral valve regurgitation. No evidence of mitral stenosis.  4. The aortic valve was not well visualized. Aortic valve regurgitation is not visualized. Aortic valve sclerosis is present, with no evidence of aortic valve stenosis.  5. The inferior vena cava is dilated in size with <50% respiratory variability, suggesting right atrial pressure of 15 mmHg.              DM 2 -  Lab Results  Component Value Date   HGBA1C 6.9 (H) 05/01/2022   on insulin, Lantus 15 units daily,     obesity-   BMI Readings from Last 1 Encounters:  09/18/22 38.71 kg/m       COPD - not  followed by pulmonology  on baseline oxygen 2L,       CKD stage IIIb- baseline Cr 1.7-2.2  Estimated Creatinine Clearance: 43 mL/min (A) (by C-G formula based on SCr of 2.1 mg/dL (H)).  Lab Results  Component Value Date   CREATININE 2.10 (H)  09/18/2022   CREATININE 1.96 (H) 09/13/2022   CREATININE 1.99 (H) 09/07/2022       Chronic anemia - baseline hg Hemoglobin & Hematocrit  Recent Labs    09/13/22 0829 09/13/22 0924 09/18/22 1701  HGB 9.9* 11.2* 10.5*   Iron/TIBC/Ferritin/ %Sat    Component Value Date/Time   IRON 20 (L) 09/06/2022 1349   TIBC 238 (L) 09/06/2022 1349   FERRITIN 70 07/15/2019 0248   IRONPCTSAT 8 (L) 09/06/2022 1349      While in ER: Clinical Course as of 09/18/22 1924  Tue Sep 18, 2022  1802 CT with new hypodensity in the R occipital region concerning for subacute vs chronic infarct. Neurology will be consulted in the setting of his new vision changes. [VK]  1821 I spoke with Dr. Leonel Ramsay of neurology who recommended aspirin, MRI and admission for subacute stroke work up. [VK]  1851 Cr approximately at baseline. He will be admitted to medicine. [VK]    Clinical Course User Index [VK] Kemper Durie, DO       Lab Orders         Comprehensive metabolic panel         Lipase, blood         CBC with Differential         CBG monitoring, ED      CT HEAD new hypodensity in the R occipital region concerning for subacute vs chronic infarct.     CXR - ordered MRI - - ordered  Following Medications were ordered in ER: Medications  aspirin chewable tablet 81 mg (has no administration in time range)  LORazepam (ATIVAN) tablet 1 mg (has no administration in time range)  ondansetron (ZOFRAN) injection 4 mg (4 mg Intravenous Given 09/18/22 1700)  oxyCODONE (Oxy IR/ROXICODONE) immediate release tablet 20 mg (20 mg Oral Given 09/18/22 1731)    _______________________________________________________ ER Provider Called:  Neurology     Dr. Leonel Ramsay They Recommend admit to medicine    SEEN in ER   ED Triage Vitals  Enc Vitals Group     BP 09/18/22 1600 (!) 180/88     Pulse Rate 09/18/22 1600 85     Resp 09/18/22 1600 18     Temp 09/18/22 1600 98 F (36.7 C)     Temp Source 09/18/22 1600  Oral     SpO2  09/18/22 1600 95 %     Weight 09/18/22 1604 247 lb 2.2 oz (112.1 kg)     Height 09/18/22 1604 5\' 7"  (1.702 m)     Head Circumference --      Peak Flow --      Pain Score 09/18/22 1604 7     Pain Loc --      Pain Edu? --      Excl. in [place]? --   TMAX(24)@     _________________________________________ Significant initial  Findings: Abnormal Labs Reviewed  COMPREHENSIVE METABOLIC PANEL - Abnormal; Notable for the following components:      Result Value   Sodium 134 (*)    Glucose, Bld 203 (*)    BUN 25 (*)    Creatinine, Ser 2.10 (*)    Albumin 3.1 (*)    GFR, Estimated 35 (*)    All other components within normal limits  CBC WITH DIFFERENTIAL/PLATELET - Abnormal; Notable for the following components:   RBC 3.99 (*)    Hemoglobin 10.5 (*)    HCT 34.7 (*)    RDW 16.1 (*)    Platelets 448 (*)    All other components within normal limits  CBG MONITORING, ED - Abnormal; Notable for the following components:   Glucose-Capillary 171 (*)    All other components within normal limits      ECG: Ordered Personally reviewed and interpreted by me showing: HR : 90 Rhythm: Sinus or ectopic atrial rhythm Incomplete right bundle branch block No significant change since last tracing QTC 476  BNP (last 3 results) Recent Labs    08/25/22 0309 09/05/22 1635 09/13/22 0830  BNP 214.2* 39.9 55.6     COVID-19 Labs  No results for input(s): "DDIMER", "FERRITIN", "LDH", "CRP" in the last 72 hours.  Lab Results  Component Value Date   SARSCOV2NAA NEGATIVE 08/22/2022   [place] NEGATIVE 01/24/2022   [place] NEGATIVE 09/05/2021   [place] NEGATIVE 07/06/2021     The recent clinical data is shown below. Vitals:   09/18/22 1600 09/18/22 1604 09/18/22 1700  BP: (!) 180/88  (!) 149/79  Pulse: 85  76  Resp: 18  20  Temp: 98 F (36.7 C)    TempSrc: Oral    SpO2: 95%  93%  Weight:  112.1 kg   Height:  5\' 7"  (1.702 m)     WBC     Component Value Date/Time    WBC 6.8 09/18/2022 1701   LYMPHSABS 1.9 09/18/2022 1701   MONOABS 0.5 09/18/2022 1701   EOSABS 0.2 09/18/2022 1701   BASOSABS 0.1 09/18/2022 1701        Lactic Acid, Venous    Component Value Date/Time   LATICACIDVEN 1.0 09/05/2022 1647      Lactic Acid, Venous    Component Value Date/Time   LATICACIDVEN 1.0 09/05/2022 1647      UA   ordered    Results for orders placed or performed during the hospital encounter of 09/05/22  Blood culture (routine x 2)     Status: None   Collection Time: 09/05/22  4:47 PM   Specimen: BLOOD  Result Value Ref Range Status   Specimen Description   Final    BLOOD SITE NOT SPECIFIED Performed at [facility], [place] [address], [place], [place] [address]    Special Requests   Final    BOTTLES DRAWN AEROBIC AND ANAEROBIC Blood Culture adequate volume Performed at [facility] [address] [place],  [place] [address]    Culture   Final    NO GROWTH 5 DAYS Performed at [facility], [place] [address], [place], [place] [address]    Report Status 09/10/2022 FINAL  Final  Blood culture (routine x 2)     Status: None   Collection Time: 09/06/22  3:57 AM   Specimen: BLOOD  Result Value Ref Range Status   Specimen Description   Final    BLOOD BLOOD LEFT ARM Performed at [facility] [address] [place], [place] [address]    Special Requests   Final    BOTTLES DRAWN AEROBIC ONLY Blood Culture adequate volume Performed at [facility] [address] [place], [place] [address]    Culture   Final    NO GROWTH 5 DAYS Performed at [facility], [place] [address], [place], [place] [address]    Report Status 09/11/2022 FINAL  Final  Culture, blood (Routine X 2) w Reflex to ID Panel     Status: None   Collection Time: 09/06/22  3:57 AM   Specimen: BLOOD  Result Value Ref Range Status   Specimen Description   Final    BLOOD BLOOD LEFT HAND Performed at [facility] [address] [place], [place] [address]    Special Requests   Final    BOTTLES DRAWN AEROBIC ONLY Blood Culture adequate volume Performed at [facility] [address] [place], [place] [address]    Culture   Final    NO GROWTH 5 DAYS Performed at [facility], [place] [address] [place], [place] [address]    Report Status 09/11/2022 FINAL  Final    ABX started Antibiotics Given (last 72 hours)     None       Susceptibility data from last 90 days. Collected Specimen Info Organism AMPICILLIN CEFTRIAXONE Clindamycin Erythromycin LEVOFLOXACIN PENICILLIN TELAVANCIN  08/22/22 Blood from BLOOD Streptococcus group g  S  S  R  R  S  S  S      Recent Labs  Lab 09/13/22 0829 09/13/22 0924 09/18/22 1701  NA 134* 137 134*  K 4.8 4.6 4.6  CO2 24  --  22  GLUCOSE 174*  --  203*  BUN 23  --  25*  CREATININE 1.96*  --  2.10*  CALCIUM 9.2  --  8.9    Cr   stable,     Lab Results  Component Value Date   CREATININE 2.10 (H) 09/18/2022   CREATININE 1.96 (H) 09/13/2022   CREATININE 1.99 (H) 09/07/2022    Recent Labs  Lab 09/13/22 0829 09/18/22 1701  AST 17 24  ALT 12 10  ALKPHOS 64 63  BILITOT 0.6 0.7  PROT 8.0 7.4  ALBUMIN 3.0* 3.1*   Lab Results  Component Value Date   CALCIUM 8.9 09/18/2022   PHOS 4.1 05/03/2022    Plt: Lab Results  Component Value Date   PLT 448 (H) 09/18/2022       Recent Labs  Lab 09/13/22 0829 09/13/22 0924 09/18/22 1701  WBC 7.6  --  6.8  NEUTROABS 5.1  --  4.2  HGB 9.9* 11.2* 10.5*  HCT 32.9* 33.0* 34.7*  MCV 86.4  --  87.0  PLT 317  --  448*    HG/HCT  stable,     Component Value Date/Time   HGB 10.5 (L) 09/18/2022 1701   HGB 10.7 (L) 02/24/2020 [id number]  HCT 34.7 (L) 09/18/2022 1701   MCV 87.0 09/18/2022 1701      Recent Labs  Lab 09/18/22 1701  LIPASE 24   No results for input(s): "AMMONIA" in the last 168 hours.       _______________________________________________ Hospitalist was called for admission for    Cerebrovascular accident (CVA), unspecified mechanism Diabetic foot ulcer    The following Work up has been ordered so far:  Orders Placed This Encounter  Procedures   CT Head Wo Contrast   MR BRAIN WO CONTRAST   Comprehensive metabolic panel   Lipase, blood   CBC with Differential   Visual acuity screening   Consult to neurology   Consult to hospitalist   CBG monitoring, ED   EKG 12-Lead   ED EKG     OTHER Significant initial  Findings:  labs showing:     DM  labs:  HbA1C: Recent Labs    05/01/22 0813  HGBA1C 6.9*       CBG (last 3)  Recent Labs    09/18/22 1717  GLUCAP 171*          Cultures:    Component Value Date/Time   SDES  09/06/2022 0357    BLOOD BLOOD LEFT ARM Performed at [facility], [place] [address] [place], [place] [address]    SDES  09/06/2022 [phone]    BLOOD BLOOD LEFT HAND Performed at [facility], [place] [address], [place], [place] [address]    SPECREQUEST  09/06/2022 0357    BOTTLES DRAWN AEROBIC ONLY Blood Culture adequate volume Performed at [facility] [address], [place], [place] [address]    SPECREQUEST  09/06/2022 0357    BOTTLES DRAWN AEROBIC ONLY Blood Culture adequate volume Performed at [facility] [address] [place], [place] [address]    CULT  09/06/2022 0357    NO GROWTH 5 DAYS Performed at [facility], [place] [address] [place], [place] [address]    CULT  09/06/2022 0357    NO GROWTH 5 DAYS Performed at [facility], [place] [address] [place], [place] [address]    REPTSTATUS 09/11/2022 FINAL 09/06/2022 0357   REPTSTATUS 09/11/2022 FINAL 09/06/2022 0357     Radiological Exams on Admission: CT Head Wo Contrast  Result Date: 09/18/2022 CLINICAL DATA:  Neurologic deficit, history of melanoma EXAM: CT HEAD WITHOUT CONTRAST  TECHNIQUE: Contiguous axial images were obtained from the base of the skull through the vertex without intravenous contrast. RADIATION DOSE REDUCTION: This exam was performed according to the departmental dose-optimization program which includes automated exposure control, adjustment of the mA and/or kV according to patient size and/or use of iterative reconstruction technique. COMPARISON:  01/24/2022 FINDINGS: Brain: Hypodensities within the right occipital cortex and subcortical white matter are seen, new since prior study, consistent with subacute to chronic ischemic changes. No evidence of acute infarct or hemorrhage. Lateral ventricles and midline structures are unremarkable. There are no acute extra-axial fluid collections. No mass effect. Vascular: Atherosclerosis of the left vertebral artery and internal carotid arteries. No hyperdense vessel. Skull: Normal. Negative for fracture or focal lesion. Sinuses/Orbits: No acute finding. Stable postsurgical changes within the right infraorbital soft tissues consistent with prior melanoma resection. The associated soft tissue edema seen on prior exam has resolved in the interim. Other: Note is made of marked fatty atrophy of the tongue, asymmetric on the left, new since prior study. IMPRESSION: 1. New hypodensities within the right occipital  cortex and subcortical white matter, with an appearance most consistent with subacute to chronic ischemic changes. 2. No evidence of acute infarct or hemorrhage. 3. Interval development of marked fatty atrophy of the tongue, left greater than right. 4. Postsurgical changes of the right cheek related to prior melanoma resection. Electronically Signed   By: Randa Ngo M.D.   On: 09/18/2022 17:33   _______________________________________________________________________________________________________ Latest  Blood pressure (!) 149/79, pulse 76, temperature 98 F (36.7 C), temperature source Oral, resp. rate 20, height 5\' 7"   (1.702 m), weight 112.1 kg, SpO2 93 %.   Vitals  labs and radiology finding personally reviewed  Review of Systems:    Pertinent positives include:   , fatigue nausea, loss of vison Constitutional:  No weight loss, night sweats, Fevers, chills, weight loss  HEENT:  No headaches, Difficulty swallowing,Tooth/dental problems,Sore throat,  No sneezing, itching, ear ache, nasal congestion, post nasal drip,  Cardio-vascular:  No chest pain, Orthopnea, PND, anasarca, dizziness, palpitations.no Bilateral lower extremity swelling  GI:  No heartburn, indigestion, abdominal pain, vomiting, diarrhea, change in bowel habits, loss of appetite, melena, blood in stool, hematemesis Resp:  no shortness of breath at rest. No dyspnea on exertion, No excess mucus, no productive cough, No non-productive cough, No coughing up of blood.No change in color of mucus.No wheezing. Skin:  no rash or lesions. No jaundice GU:  no dysuria, change in color of urine, no urgency or frequency. No straining to urinate.  No flank pain.  Musculoskeletal:  No joint pain or no joint swelling. No decreased range of motion. No back pain.  Psych:  No change in mood or affect. No depression or anxiety. No memory loss.  Neuro: no localizing neurological complaints, no tingling, no weakness, no double vision, no gait abnormality, no slurred speech, no confusion  All systems reviewed and apart from [place] all are negative _______________________________________________________________________________________________ Past Medical History:   Past Medical History:  Diagnosis Date   Acquired absence of left great toe    Acquired absence of right leg below knee    Acute osteomyelitis of calcaneum, right 10/02/2016   Acute respiratory failure    Amputation stump infection 08/12/2018   Anemia    Basal cell carcinoma, eyelid    Cancer    Candidiasis 08/12/2018   CHF (congestive heart failure)    chronic diastolic    Chronic back  pain    Chronic kidney disease    stage 3    Chronic multifocal osteomyelitis    of ankle and foot    Chronic pain syndrome    COPD (chronic obstructive pulmonary disease)    DDD (degenerative disc disease), lumbar    Depression    major depressive disorder    Diabetes mellitus without complication    type 2    Diabetic ulcer of toe of left foot 10/02/2016   Difficult intubation    Difficulty in walking, not elsewhere classified    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   Foot drop, bilateral    Foot drop, right 12/27/2015   GERD (gastroesophageal reflux disease)    Herpesviral vesicular dermatitis    History of COVID-19 03/15/2022   Hyperlipidemia    Hyperlipidemia    Hypertension    Insomnia    Malignant melanoma of other parts of face    Melanoma    MRSA bacteremia    Muscle weakness (generalized)    Nasal congestion    Necrosis of toe 07/08/2018   Neuromuscular dysfunction of bladder    Osteomyelitis  Osteomyelitis    Osteomyelitis    right BKA   Other idiopathic peripheral autonomic neuropathy    Retention of urine, unspecified    Squamous cell carcinoma of skin    Unsteadiness on feet    Urinary retention    Urinary retention    Urinary tract infection    Wears dentures    Wears glasses       Past Surgical History:  Procedure Laterality Date   AMPUTATION Left 03/29/2017   Procedure: LEFT GREAT TOE AMPUTATION AT METATARSOPHALANGEAL JOINT;  Surgeon: [name], MD;  Location: [place];  Service: Orthopedics;  Laterality: Left;   AMPUTATION Right 03/29/2017   Procedure: RIGHT BELOW KNEE AMPUTATION;  Surgeon: [name], MD;  Location: [place];  Service: Orthopedics;  Laterality: Right;   AMPUTATION Left 06/06/2018   Procedure: LEFT 2ND TOE AMPUTATION;  Surgeon: [name], MD;  Location: [place];  Service: Orthopedics;  Laterality: Left;   AMPUTATION Right 11/19/2018   Procedure: AMPUTATION ABOVE KNEE;  Surgeon: [name], MD;  Location: [place];  Service:  Orthopedics;  Laterality: Right;   ANTERIOR CERVICAL CORPECTOMY N/A 11/25/2015   Procedure: ANTERIOR CERVICAL FIVE CORPECTOMY Cervical four - six fusion;  Surgeon: Consuella Lose, MD;  Location: [facility];  Service: Neurosurgery;  Laterality: N/A;  ANTERIOR CERVICAL FIVE CORPECTOMY Cervical four - six fusion   APPLICATION OF WOUND VAC Right 10/22/2018   Procedure: Application Of Wound Vac;  Surgeon: [name], MD;  Location: [place];  Service: Orthopedics;  Laterality: Right;   CYSTOSCOPY WITH BIOPSY N/A 05/01/2022   Procedure: CYSTOSCOPY WITH URETHRAL BIOPSY;  Surgeon: Robley Fries, MD;  [facility];  Service: Urology;  Laterality: N/A;  1 HR   CYSTOSCOPY WITH RETROGRADE URETHROGRAM N/A 04/25/2018   Procedure: CYSTOSCOPY WITH RETROGRADE URETHROGRAM/ BALLOON DILATION;  Surgeon: Kathie Rhodes, MD;  Location: [facility];  Service: Urology;  Laterality: N/A;   CYSTOSCOPY WITH URETHRAL DILATATION N/A 05/01/2022   Procedure: BALLOON DILATION WITH  OPTILUME;  Surgeon: Robley Fries, MD;  Location: [facility];  Service: Urology;  Laterality: N/A;   MULTIPLE TOOTH EXTRACTIONS     POSTERIOR CERVICAL FUSION/FORAMINOTOMY N/A 11/29/2015   Procedure: Cervical Three-Cervical Seven Posterior Cervical Laminectomy with Fusion;  Surgeon: Consuella Lose, MD;  Location: [place] [facility];  Service: Neurosurgery;  Laterality: N/A;  Cervical Three-Cervical Seven Posterior Cervical Laminectomy with Fusion   STUMP REVISION Right 10/22/2018   Procedure: REVISION RIGHT BELOW KNEE AMPUTATION;  Surgeon: [name], MD;  Location: [place];  Service: Orthopedics;  Laterality: Right;   STUMP REVISION Right 12/05/2018   Procedure: Revision Right Above Knee Amputation;  Surgeon: [name], MD;  Location: [place];  Service: Orthopedics;  Laterality: Right;   STUMP REVISION Right 05/29/2019   Procedure: REVISION RIGHT ABOVE KNEE AMPUTATION;  Surgeon: [name], MD;  Location: [place];  Service: Orthopedics;  Laterality:  Right;   STUMP REVISION Right 06/24/2019   Procedure: STUMP REVISION;  Surgeon: [name], MD;  Location: [place];  Service: Orthopedics;  Laterality: Right;    Social History:  Ambulatory   wheelchair bound,       reports that he has been smoking cigarettes. He has been smoking an average of .5 packs per day. He has never used smokeless tobacco. He reports that he does not drink alcohol and does not use drugs.     Family History:   Family History  Problem Relation  Age of Onset   Diabetes Mother    Heart disease Mother    Cancer Mother    Cancer Father    Bone cancer Father    Prostate cancer Father    Cancer Paternal Grandfather    Cancer Other    Diabetes Other    ______________________________________________________________________________________________ Allergies: Allergies  Allergen Reactions   Latex Other (See Comments)    "ALLERGIC," per [facility]     Prior to Admission medications   Medication Sig Start Date End Date Taking? Authorizing Provider  Amino Acids-Protein Hydrolys (PRO-STAT) LIQD Take 30 mLs by mouth in the morning.    [provider]  cetirizine (ZYRTEC) 10 MG tablet Take 10 mg by mouth daily.    [provider]  cyanocobalamin 1000 MCG tablet Take 1,000 mcg by mouth daily.    [provider]  docusate sodium (COLACE) 100 MG capsule Take 1 capsule (100 mg total) by mouth 2 (two) times daily. 05/31/19   Amin, Jeanella Flattery, MD  fenofibrate (TRICOR) 48 MG tablet Take 48 mg by mouth daily.    [provider]  ferrous sulfate 325 (65 FE) MG tablet Take 1 tablet (325 mg total) by mouth daily with breakfast. 11/23/18   Matilde Haymaker, MD  fluticasone [facility]) 50 MCG/ACT nasal spray Place 1 spray into both nostrils in the morning.    [provider]  furosemide (LASIX) 40 MG tablet Take 1 tablet (40 mg total) by mouth daily. 09/07/22   Rai, Ripudeep K, MD  Glucagon, rDNA, (GLUCAGON EMERGENCY) 1 MG KIT Inject 1 mg into  the vein as needed (for hypolycemia).    [provider]  insulin glargine (LANTUS SOLOSTAR) 100 UNIT/ML Solostar Pen Inject 15 Units into the skin daily. 09/07/22   Rai, Ripudeep K, MD  insulin lispro (HUMALOG) 100 UNIT/ML KwikPen Inject 0-10 Units into the skin See admin instructions. Inject 0-10 units into the skin three times a day and at bedtime, PER SLIDING SCALE:  BGL 151-200 = 2 units 201-250 = 4 units 251-300 = 6 units 301-350 =  8 units 351-400 = 10 units Patient taking differently: Inject 3-20 Units into the skin See admin instructions. Inject 3-20 units into the skin before meals and at bedtime, PER SLIDING SCALE: BGL 121-150 = 3 units; 151-200 = 4 units; 201-250 = 7 units; 251-300 = 11 units; 301-350 = 15 units; 351-400 = 20 units; >400 = CALL MD 01/31/22   Terrilee Croak, MD  ipratropium-albuterol (DUONEB) 0.5-2.5 (3) MG/3ML SOLN Take 3 mLs by nebulization every 6 (six) hours as needed (for shortness of breath).    [provider]  lactulose (CHRONULAC) 10 GM/15ML solution Take 15 mLs (10 g total) by mouth 3 (three) times daily as needed for moderate constipation. Patient taking differently: Take 10 g by mouth every 8 (eight) hours as needed for moderate constipation. 01/31/22   Terrilee Croak, MD  leptospermum manuka honey (MEDIHONEY) PSTE paste Apply 1 Application topically daily. 09/08/22   Rai, Ripudeep Raliegh Ip, MD  Lidocaine 4 % PTCH Apply 1 patch topically See admin instructions. Apply 1 patch to both the right shoulder and the right stump at 9 AM daily- remove at 9 PM    [provider]  LORazepam (ATIVAN) 0.5 MG tablet Take 1 tablet (0.5 mg total) by mouth every 6 (six) hours as needed for anxiety. 09/07/22   Rai, Ripudeep K, MD  magnesium oxide (MAG-OX) 400 MG tablet Take 400 mg by mouth 2 (two) times daily.  [provider]  melatonin 3 MG TABS tablet Take 3 mg by mouth at bedtime.    [provider]  Menthol, Topical Analgesic, (BIOFREEZE) 4  % GEL Apply 1 Application topically See admin instructions. Apply to right leg/stump every 8 hours as needed for pain    [provider]  Meth-Hyo-M Bl-Na Phos-Ph Sal (URIBEL) 118 MG CAPS Take 1 capsule by mouth every 8 (eight) hours as needed (bladder pain).    [provider]  methenamine (MANDELAMINE) 1 g tablet Take 1 g by mouth daily.    [provider]  metoprolol tartrate (LOPRESSOR) 25 MG tablet Take 0.5 tablets (12.5 mg total) by mouth 2 (two) times daily. 07/18/19 09/06/22  Elodia Florence., MD  mirabegron ER (MYRBETRIQ) 50 MG TB24 tablet Take 50 mg by mouth daily.    [provider]  Multiple Vitamins-Minerals (MULTIVITAMIN WITH MINERALS) tablet Take 1 tablet by mouth daily.    [provider]  Multiple Vitamins-Minerals (PRESERVISION AREDS 2) CAPS Take 1 capsule by mouth in the morning and at bedtime.    [provider]  naloxone [facility]) 4 MG/0.1ML LIQD nasal spray kit Place 1 spray into the nose as needed (poorly responsive or turning blue).    [provider]  nystatin cream (MYCOSTATIN) Apply 1 Application topically See admin instructions. Apply to wound site every day shift and as needed, for wound care    [provider]  omeprazole (PRILOSEC) 20 MG capsule Take 20 mg by mouth daily.    [provider]  ondansetron (ZOFRAN) 4 MG tablet Take 4 mg by mouth every 8 (eight) hours as needed for nausea.    [provider]  oxybutynin (DITROPAN-XL) 10 MG 24 hr tablet Take 10 mg by mouth daily.    [provider]  Oxycodone HCl 20 MG TABS Take 1 tablet (20 mg total) by mouth every 8 (eight) hours as needed (severe pain). 09/07/22   Rai, Vernelle Emerald, MD  OXYGEN Inhale 2 L/min into the lungs as needed (for shortness of breath- to maintain sats >90%).    [provider]  oxymetazoline (AFRIN 12 HOUR) 0.05 % nasal spray Place 1 spray into both nostrils every 12 (twelve) hours as needed  for congestion.    [provider]  Polyethyl Glycol-Propyl Glycol (LUBRICANT EYE DROPS) 0.4-0.3 % SOLN Place 2 drops into both eyes every 4 (four) hours as needed (dryness/itching).    [provider]  polyethylene glycol (MIRALAX / GLYCOLAX) 17 g packet Take 17 g by mouth daily as needed for mild constipation.    [provider]  rosuvastatin (CRESTOR) 10 MG tablet Take 10 mg by mouth at bedtime.    [provider]  senna (SENOKOT) 8.6 MG TABS tablet Take 1 tablet by mouth at bedtime.    [provider]  SPIRIVA RESPIMAT 1.25 MCG/ACT AERS Inhale 1 each into the lungs in the morning.    [provider]  tamsulosin (FLOMAX) 0.4 MG CAPS capsule Take 0.8 mg by mouth daily. 11/26/13   [provider]  TRULICITY [id number] SOPN Inject 0.75 mg into the skin every Wednesday.    [provider]    ___________________________________________________________________________________________________ Physical Exam:    09/18/2022    5:00 PM 09/18/2022    4:04 PM 09/18/2022    4:00 PM  Vitals with BMI  Height  5\' 7"    Weight  247 lbs 2 oz   BMI  [id number]   Systolic  123456  99991111  Diastolic 79  88  Pulse 76  85     1. General:  in No  Acute distress    Chronically ill   -appearing 2. Psychological: Alert and   Oriented 3. Head/ENT:    Dry Mucous Membranes                          Head Non traumatic, neck supple                           Poor Dentition 4. SKIN:  decreased Skin turgor,  Skin redness and chronic venous stasis changes over left lower extremity with ulcer noted of left ankle with some purulent drainage overall redness noted unclear if acute versus chronic new ulceration noted    5. Heart: Regular rate and rhythm no  Murmur, no Rub or gallop 6. Lungs  , no wheezes or crackles   7. Abdomen: Soft,  non-tender,   distended   obese  bowel sounds present 8. Lower extremities: no clubbing, cyanosis,  left leg edema 9.  Neurologically Grossly intact, moving all 4 extremities equally  reports decreased vision in both eyes Sp right AKA, tremor noted 10. MSK: Normal range of motion    Chart has been reviewed  ______________________________________________________________________________________________  Assessment/Plan 62 y.o. male with medical history significant of symptomatic anemia, chronic diastolic CHF, hypertension, DM 2, CKD stage IIIb, chronic hypoxic respiratory failure, COPD chronic hypoxic respiratory failure      Admitted for  Cerebrovascular accident (CVA), unspecified mechanism Diabetic foot ulcer    Present on Admission:  Stroke-like symptom  COPD (chronic obstructive pulmonary disease)  Chronic diastolic HF (heart failure)  HTN (hypertension)  HLD (hyperlipidemia)  Type II diabetes mellitus with neurological manifestations  GERD without esophagitis  Diabetic ulcer of lower leg  Constipation  Tobacco abuse  CKD (chronic kidney disease), stage III  Normocytic anemia     Stroke-like symptom  - will admit based on TIA/CVA protocol,        Monitor on Tele Ct showing New hypodensities within the right occipital cortex and subcortical white matter, with an appearance most consistent with subacute to chronic ischemic changes.       MRA/MRI await  results           Carotid Doppler ordered        Echo to evaluate for possible embolic source,        obtain cardiac enzymes,  ECG,   Lipid panel, TSH.        Order PT/OT evaluation.          Passed swallow eval         Will make sure patient is on antiplatelet ASA 81  statin change to lipitor 80 mg po q day       Allow permissive Hypertension keep BP <220/120        Neurology consulted will see pt in ER    COPD (chronic obstructive pulmonary disease) Chronic stable continue home meds, O2 as needed   Chronic diastolic HF (heart failure) Currently on the dry side hold lasix  HTN (hypertension) Allow permissive htn  HLD  (hyperlipidemia) Obtain lipid panel would probably benefit from changing statin to lipitor 80 g po qday  Type II diabetes mellitus with neurological manifestations  - Order Sensitive  SSI   - continue home insulin Lantus but decreased to  10 units,  -  check TSH and HgA1C  - Hold by mouth medications    GERD without esophagitis Continue protonix 40 mg po bid  Diabetic ulcer of lower leg Noted foul discharge from diabetic foot ulcer of the ankle.  Obtain plain images Sed rate CRP check ABIs Patient is endorsing nausea No fever chills or elevated white blood cell count Unclear duration does not seem to have any wound care follow-up. May need father imaging to rule out osteomyelitis and would probably benefit from orthopedics consult For tonight cover with ceftriaxone metronidazole and Vanco  Constipation Obtain KUB in order bowel regimen  Tobacco abuse  - Spoke about importance of quitting spent 5 minutes discussing options for treatment, prior attempts at quitting, and dangers of smoking  -At this point patient is   NOT  interested in quitting  - order nicotine patch   - nursing tobacco cessation protocol   CKD (chronic kidney disease), stage III  -chronic avoid nephrotoxic medications such as NSAIDs, Vanco Zosyn combo,  avoid hypotension, continue to follow renal function   Normocytic anemia Chronic stable continue to follow CBC  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  DVT prophylaxis:  SCD     Code Status:    Code Status: Prior FULL CODE  as per patient   I had personally discussed CODE STATUS with patient   ACP none  Family Communication:   Family not at  Bedside    Diet diabetic diet   Disposition Plan:     likely will need placement for rehabilitation                             Following barriers for discharge:                                                      Will need to be able to tolerate PO                                                  Will need consultants to evaluate patient prior to discharge       Consult Orders  (From admission, onward)           Start     Ordered   09/18/22 2019  PT eval and treat  Routine        09/18/22 2018   09/18/22 2019  OT eval and treat  Routine        09/18/22 2018   09/18/22 2019  SLP eval and treat Reason for evaluation: Cognitive/Language evaluation  Once       Question:  Reason for evaluation  Answer:  Cognitive/Language evaluation   09/18/22 2018   09/18/22 2013  Consult to Registered Dietitian  Once       Provider:  (Not yet assigned)  Question:  Reason for consult?  Answer:  Wound healing   09/18/22 2013   09/18/22 1853  Consult to hospitalist  Pg sent by deloris  Once       Provider:  (Not yet assigned)  Question Answer Comment  Place call to: [facility]   Reason for Consult Admit      09/18/22 1852                               Would benefit from PT/OT eval prior to DC  Ordered                                       Transition of care consulted                   Nutrition    consulted             Consults called neurology is aware,  May need orthopedics consult in AM   Admission status:  ED Disposition     ED Disposition  [place]: [facility] [[id number]]  Level of Care: Telemetry Medical [[id number]]  May admit patient to [facility] or [facility] if equivalent level of care is available:: No  Covid Evaluation: Asymptomatic - no recent exposure (last 10 days) testing not required  Diagnosis: Stroke-like symptom [id number]  Admitting Physician: Toy Baker [id number]  Attending Physician: Toy Baker [id number]  Certification:: I certify this patient will need inpatient services for at least 2 midnights  Estimated Length of Stay: 2            inpatient     I Expect 2 midnight stay secondary to severity of patient's current illness need for  inpatient interventions justified by the following:     Severe lab/radiological/exam abnormalities including:    New Cva and extensive comorbidities including:    DM2    CHF     COPD/asthma  Obesity   CKD   History of amputation    That are currently affecting medical management.   I expect  patient to be hospitalized for 2 midnights requiring inpatient medical care.  Patient is at high risk for adverse outcome (such as loss of life or disability) if not treated.  Indication for inpatient stay as follows:      Need for operative/procedural  intervention     Need for IV antibiotics,      Level of care     tele indefinitely please discontinue once patient no longer qualifies COVID-19 Labs    Janyce Ellinger 09/18/2022, 8:25 PM    [facility]     after 2 AM please page floor coverage PA If 7AM-7PM, please contact the day team taking care of the patient using [email or web address]

## 2022-09-18 NOTE — Subjective & Objective (Signed)
Patient presents from [facility] patient states that he cannot see his vision chronic back he was riding his motorized wheelchair when EMS arrived has history of prior visual loss secondary to a blood clot on patient states he has been having intermittent visual loss for the past few weeks but worse today Also endorses nausea foul-smelling urine states that he can only now see shapes no vomiting but does endorse abdominal discomfort which is diffuse otherwise no other localized neurological deficits no eye pain

## 2022-09-18 NOTE — Assessment & Plan Note (Signed)
-  chronic avoid nephrotoxic medications such as NSAIDs, Vanco Zosyn combo,  avoid hypotension, continue to follow renal function  

## 2022-09-18 NOTE — Progress Notes (Signed)
Patient just arrived to the unit as a new admission. Dr. Bridgett Larsson was notified, aware of patient's arrival.

## 2022-09-18 NOTE — Assessment & Plan Note (Signed)
-   Order Sensitive  SSI   - continue home insulin Lantus but decreased to  10 units,  -  check TSH and HgA1C  - Hold by mouth medications

## 2022-09-18 NOTE — Progress Notes (Signed)
Patient refused labs. Dr. Bridgett Larsson aware.

## 2022-09-18 NOTE — Assessment & Plan Note (Signed)
Currently on the dry side hold lasix

## 2022-09-18 NOTE — Assessment & Plan Note (Signed)
Noted foul discharge from diabetic foot ulcer of the ankle.  Obtain plain images Sed rate CRP check ABIs Patient is endorsing nausea No fever chills or elevated white blood cell count Unclear duration does not seem to have any wound care follow-up. May need father imaging to rule out osteomyelitis and would probably benefit from orthopedics consult For tonight cover with ceftriaxone metronidazole and Vanco

## 2022-09-18 NOTE — ED Notes (Signed)
Pt given ice chips, given the ok by PA.

## 2022-09-18 NOTE — ED Provider Notes (Signed)
[name] Provider Note   CSN: [id number] Arrival date & time:        History  Chief Complaint  Patient presents with   Loss of Vision    CHANE LUNN is a 62 y.o. male.  Patient is a 62 year old male with a past medical history of diabetes status post right AKA, COPD presenting to the emergency department with nausea and vision loss.  The patient states for the last 2 weeks he has had progressively worsening vision loss.  He states that he can now only see shapes from his bilateral eyes.  He states that he has been nauseous but denies any vomiting.  He states that he was seen in the ED for his nausea and there unsure of the cause.  He states that he is diffuse abdominal discomfort.  He denies any numbness or weakness in his arms or legs.  He denies any eye pain.  He states that he normally wears glasses but has not seen his optometrist since he has started to have the vision loss.  The history is provided by the patient.       Home Medications Prior to Admission medications   Medication Sig Start Date End Date Taking? Authorizing Provider  Amino Acids-Protein Hydrolys (PRO-STAT) LIQD Take 30 mLs by mouth in the morning.    [provider]  cetirizine (ZYRTEC) 10 MG tablet Take 10 mg by mouth daily.    [provider]  cyanocobalamin 1000 MCG tablet Take 1,000 mcg by mouth daily.    [provider]  docusate sodium (COLACE) 100 MG capsule Take 1 capsule (100 mg total) by mouth 2 (two) times daily. 05/31/19   Amin, Jeanella Flattery, MD  fenofibrate (TRICOR) 48 MG tablet Take 48 mg by mouth daily.    [provider]  ferrous sulfate 325 (65 FE) MG tablet Take 1 tablet (325 mg total) by mouth daily with breakfast. 11/23/18   Matilde Haymaker, MD  fluticasone [place]) 50 MCG/ACT nasal spray Place 1 spray into both nostrils in the morning.    [provider]  furosemide (LASIX) 40 MG tablet Take 1 tablet  (40 mg total) by mouth daily. 09/07/22   Rai, Ripudeep K, MD  Glucagon, rDNA, (GLUCAGON EMERGENCY) 1 MG KIT Inject 1 mg into the vein as needed (for hypolycemia).    [provider]  insulin glargine (LANTUS SOLOSTAR) 100 UNIT/ML Solostar Pen Inject 15 Units into the skin daily. 09/07/22   Rai, Ripudeep K, MD  insulin lispro (HUMALOG) 100 UNIT/ML KwikPen Inject 0-10 Units into the skin See admin instructions. Inject 0-10 units into the skin three times a day and at bedtime, PER SLIDING SCALE:  BGL 151-200 = 2 units 201-250 = 4 units 251-300 = 6 units 301-350 =  8 units 351-400 = 10 units Patient taking differently: Inject 3-20 Units into the skin See admin instructions. Inject 3-20 units into the skin before meals and at bedtime, PER SLIDING SCALE: BGL 121-150 = 3 units; 151-200 = 4 units; 201-250 = 7 units; 251-300 = 11 units; 301-350 = 15 units; 351-400 = 20 units; >400 = CALL MD 01/31/22   Terrilee Croak, MD  ipratropium-albuterol (DUONEB) 0.5-2.5 (3) MG/3ML SOLN Take 3 mLs by nebulization every 6 (six) hours as needed (for shortness of breath).    [provider]  lactulose (CHRONULAC) 10 GM/15ML solution Take 15 mLs (10 g total) by mouth 3 (three) times daily as needed for  moderate constipation. Patient taking differently: Take 10 g by mouth every 8 (eight) hours as needed for moderate constipation. 01/31/22   Terrilee Croak, MD  leptospermum manuka honey (MEDIHONEY) PSTE paste Apply 1 Application topically daily. 09/08/22   Rai, Ripudeep Raliegh Ip, MD  Lidocaine 4 % PTCH Apply 1 patch topically See admin instructions. Apply 1 patch to both the right shoulder and the right stump at 9 AM daily- remove at 9 PM    [provider]  LORazepam (ATIVAN) 0.5 MG tablet Take 1 tablet (0.5 mg total) by mouth every 6 (six) hours as needed for anxiety. 09/07/22   Rai, Ripudeep K, MD  magnesium oxide (MAG-OX) 400 MG tablet Take 400 mg by mouth 2 (two) times daily.    [provider]   melatonin 3 MG TABS tablet Take 3 mg by mouth at bedtime.    [provider]  Menthol, Topical Analgesic, (BIOFREEZE) 4 % GEL Apply 1 Application topically See admin instructions. Apply to right leg/stump every 8 hours as needed for pain    [provider]  Meth-Hyo-M Bl-Na Phos-Ph Sal (URIBEL) 118 MG CAPS Take 1 capsule by mouth every 8 (eight) hours as needed (bladder pain).    [provider]  methenamine (MANDELAMINE) 1 g tablet Take 1 g by mouth daily.    [provider]  metoprolol tartrate (LOPRESSOR) 25 MG tablet Take 0.5 tablets (12.5 mg total) by mouth 2 (two) times daily. 07/18/19 09/06/22  Elodia Florence., MD  mirabegron ER (MYRBETRIQ) 50 MG TB24 tablet Take 50 mg by mouth daily.    [provider]  Multiple Vitamins-Minerals (MULTIVITAMIN WITH MINERALS) tablet Take 1 tablet by mouth daily.    [provider]  Multiple Vitamins-Minerals (PRESERVISION AREDS 2) CAPS Take 1 capsule by mouth in the morning and at bedtime.    [provider]  naloxone [facility]) 4 MG/0.1ML LIQD nasal spray kit Place 1 spray into the nose as needed (poorly responsive or turning blue).    [provider]  nystatin cream (MYCOSTATIN) Apply 1 Application topically See admin instructions. Apply to wound site every day shift and as needed, for wound care    [provider]  omeprazole (PRILOSEC) 20 MG capsule Take 20 mg by mouth daily.    [provider]  ondansetron (ZOFRAN) 4 MG tablet Take 4 mg by mouth every 8 (eight) hours as needed for nausea.    [provider]  oxybutynin (DITROPAN-XL) 10 MG 24 hr tablet Take 10 mg by mouth daily.    [provider]  Oxycodone HCl 20 MG TABS Take 1 tablet (20 mg total) by mouth every 8 (eight) hours as needed (severe pain). 09/07/22   Rai, Vernelle Emerald, MD  OXYGEN Inhale 2 L/min into the lungs as needed (for shortness of breath- to maintain sats >90%).    [provider]  oxymetazoline (AFRIN 12 HOUR) 0.05 % nasal spray Place 1 spray into both nostrils every 12 (twelve) hours as needed for congestion.    [provider]  Polyethyl Glycol-Propyl Glycol (LUBRICANT EYE DROPS) 0.4-0.3 % SOLN Place 2 drops into both eyes every 4 (four) hours as needed (dryness/itching).    [provider]  polyethylene glycol (MIRALAX / GLYCOLAX) 17 g packet Take 17 g by mouth daily as needed for mild constipation.    [provider]  rosuvastatin (CRESTOR) 10 MG tablet Take 10 mg by mouth at bedtime.    [provider]  senna (SENOKOT) 8.6 MG TABS tablet Take 1 tablet by mouth at bedtime.    [provider]  SPIRIVA RESPIMAT 1.25 MCG/ACT AERS Inhale 1 each into the lungs in the morning.    [provider]  tamsulosin (FLOMAX) 0.4 MG CAPS capsule Take 0.8 mg by mouth daily. 11/26/13   [provider]  TRULICITY [id number] SOPN Inject 0.75 mg into the skin every Wednesday.    [provider]      Allergies    Latex    Review of Systems   Review of Systems  Physical Exam Updated Vital Signs BP (!) 149/79   Pulse 76   Temp 98 F (36.7 C) (Oral)   Resp 20   Ht 5\' 7"  (1.702 m)   Wt 112.1 kg   SpO2 93%   BMI 38.71 kg/m  Physical Exam Vitals and nursing note reviewed.  Constitutional:      General: He is not in acute distress.    Appearance: Normal appearance. He is obese.  HENT:     Head: Normocephalic and atraumatic.     Nose: Nose normal.     Mouth/Throat:     Mouth: Mucous membranes are moist.     Pharynx: Oropharynx is clear.  Eyes:     Extraocular Movements: Extraocular movements intact.     Conjunctiva/sclera: Conjunctivae normal.     Pupils: Pupils are equal, round, and reactive to light.     Comments: Able to visualize blue color of my gloves but unable to make out my hand on bilateral eyes  Cardiovascular:     Rate and Rhythm: Normal rate and regular rhythm.     Heart  sounds: Normal heart sounds.  Pulmonary:     Effort: Pulmonary effort is normal.     Breath sounds: Normal breath sounds.  Abdominal:     General: Abdomen is flat.     Palpations: Abdomen is soft.     Tenderness: There is abdominal tenderness (mild, diffuse).  Musculoskeletal:        General: Normal range of motion.     Cervical back: Normal range of motion and neck supple.     Comments: RLE AKA  Skin:    General: Skin is warm and dry.  Neurological:     Mental Status: He is alert and oriented to person, place, and time.     Comments: No facial assymetry, normal speech 5/5 strength in bilateral UE and LLE, sensation intact in all 4 extremities   Psychiatric:        Mood and Affect: Mood normal.        Behavior: Behavior normal.     ED Results / Procedures / Treatments   Labs (all labs ordered are listed, but only abnormal results are displayed) Labs Reviewed  COMPREHENSIVE METABOLIC PANEL - Abnormal; Notable for the following components:      Result Value   Sodium 134 (*)    Glucose, Bld 203 (*)    BUN 25 (*)    Creatinine, Ser 2.10 (*)    Albumin 3.1 (*)    GFR, Estimated 35 (*)    All other components within normal limits  CBC WITH DIFFERENTIAL/PLATELET - Abnormal; Notable for the following components:   RBC 3.99 (*)    Hemoglobin 10.5 (*)    HCT 34.7 (*)    RDW 16.1 (*)    Platelets 448 (*)    All other components within normal limits  CBG MONITORING, ED - Abnormal; Notable  for the following components:   Glucose-Capillary 171 (*)    All other components within normal limits  LIPASE, BLOOD    EKG EKG Interpretation  Date/Time:  Tuesday September 18 2022 16:03:14 EDT Ventricular Rate:  90 PR Interval:  207 QRS Duration: 115 QT Interval:  389 QTC Calculation: 476 R Axis:   82 Text Interpretation: Sinus or ectopic atrial rhythm Incomplete right bundle branch block No significant change since last tracing Confirmed by Leanord Asal ([id number]) on 09/18/2022 4:15:00  PM  Radiology CT Head Wo Contrast  Result Date: 09/18/2022 CLINICAL DATA:  Neurologic deficit, history of melanoma EXAM: CT HEAD WITHOUT CONTRAST TECHNIQUE: Contiguous axial images were obtained from the base of the skull through the vertex without intravenous contrast. RADIATION DOSE REDUCTION: This exam was performed according to the departmental dose-optimization program which includes automated exposure control, adjustment of the mA and/or kV according to patient size and/or use of iterative reconstruction technique. COMPARISON:  01/24/2022 FINDINGS: Brain: Hypodensities within the right occipital cortex and subcortical white matter are seen, new since prior study, consistent with subacute to chronic ischemic changes. No evidence of acute infarct or hemorrhage. Lateral ventricles and midline structures are unremarkable. There are no acute extra-axial fluid collections. No mass effect. Vascular: Atherosclerosis of the left vertebral artery and internal carotid arteries. No hyperdense vessel. Skull: Normal. Negative for fracture or focal lesion. Sinuses/Orbits: No acute finding. Stable postsurgical changes within the right infraorbital soft tissues consistent with prior melanoma resection. The associated soft tissue edema seen on prior exam has resolved in the interim. Other: Note is made of marked fatty atrophy of the tongue, asymmetric on the left, new since prior study. IMPRESSION: 1. New hypodensities within the right occipital cortex and subcortical white matter, with an appearance most consistent with subacute to chronic ischemic changes. 2. No evidence of acute infarct or hemorrhage. 3. Interval development of marked fatty atrophy of the tongue, left greater than right. 4. Postsurgical changes of the right cheek related to prior melanoma resection. Electronically Signed   By: Randa Ngo M.D.   On: 09/18/2022 17:33    Procedures Procedures    Medications Ordered in ED Medications  aspirin  chewable tablet 81 mg (has no administration in time range)  LORazepam (ATIVAN) tablet 1 mg (has no administration in time range)  ondansetron (ZOFRAN) injection 4 mg (4 mg Intravenous Given 09/18/22 1700)  oxyCODONE (Oxy IR/ROXICODONE) immediate release tablet 20 mg (20 mg Oral Given 09/18/22 1731)    ED Course/ Medical Decision Making/ A&P Clinical Course as of 09/18/22 1904  Tue Sep 18, 2022  1802 CT with new hypodensity in the R occipital region concerning for subacute vs chronic infarct. Neurology will be consulted in the setting of his new vision changes. [VK]  1821 I spoke with Dr. Leonel Ramsay of neurology who recommended aspirin, MRI and admission for subacute stroke work up. [VK]  1851 Cr approximately at baseline. He will be admitted to medicine. [VK]    Clinical Course User Index [VK] Kemper Durie, DO                             Medical Decision Making This patient presents to the ED with chief complaint(s) of vision loss, nausea with pertinent past medical history of DM, COPD which further complicates the presenting complaint. The complaint involves an extensive differential diagnosis and also carries with it a high risk of complications and morbidity.  The differential diagnosis includes CVA, retinal detachment unlikely as he has bilateral vision loss, he has no eye pain or conjunctival injection making acute angle-closure glaucoma unlikely, considering hyperglycemic crisis, electrolyte abnormality, hepatitis, pancreatitis, gastritis, GERD  Additional history obtained: Additional history obtained from N/A Records reviewed previous admission documents  ED Course and Reassessment: On patient's arrival to the emergency department he is well-appearing in no acute distress.  He does have decreased vision in bilateral eyes but no other acute focal neurologic deficits on exam.  His symptoms have been ongoing for 2 weeks and he will have workup for subacute stroke versus ocular  abnormalities for cause of his vision loss.  He will additionally have labs and given nausea medication for his nausea.  He did have recent normal workup last week in the ED for his nausea.  Independent labs interpretation:  The following labs were independently interpreted: Within normal range/at patient's baseline  Independent visualization of imaging: - I independently visualized the following imaging with scope of interpretation limited to determining acute life threatening conditions related to emergency care: CT head, which revealed subacute right occipital infarct  Consultation: - Consulted or discussed management/test interpretation w/ external professional: Neurology, hospitalist  Consideration for admission or further workup: Patient requires admission for subacute stroke workup Social Determinants of health: N/A    Amount and/or Complexity of Data Reviewed Labs: ordered. Radiology: ordered.  Risk OTC drugs. Prescription drug management. Decision regarding hospitalization.          Final Clinical Impression(s) / ED Diagnoses Final diagnoses:  Visual loss  Cerebrovascular accident (CVA), unspecified mechanism    Rx / DC Orders ED Discharge Orders     None         Kemper Durie, DO 09/18/22 1905

## 2022-09-18 NOTE — ED Notes (Signed)
ED TO INPATIENT HANDOFF REPORT  ED Nurse Name and Phone #: [phone]  S Name/Age/Gender Gregory [name] 62 y.o. male Room/Bed: 043C/043C  Code Status   Code Status: Full Code  Home/SNF/Other Nursing Home Patient oriented to: self, place, time, and situation Is this baseline? Yes   Triage Complete: Triage complete  Chief Complaint Stroke-like symptom [R29.90]  Triage Note From [facility]. Patient states that he cannot see and that all vision is just black, but was driving his motorized wheelchair around upon EMS arrival. Hx of vision loss due to blood clot previously. Vision changes started a few weeks ago per patient report, but worse today. EMS also reports foul smelling urine.   Allergies Allergies  Allergen Reactions   Latex Other (See Comments)    "ALLERGIC," per MAR    Level of Care/Admitting Diagnosis ED Disposition     ED Disposition  Admit   Condition  --   Comment  Hospital Area: [place] [[id number]]  Level of Care: Telemetry Medical [[id number]]  May admit patient to [facility] or [facility] if equivalent level of care is available:: No  Covid Evaluation: Asymptomatic - no recent exposure (last 10 days) testing not required  Diagnosis: Stroke-like symptom [id number]  Admitting Physician: Toy Baker [id number]  Attending Physician: Toy Baker [id number]  Certification:: I certify this patient will need inpatient services for at least 2 midnights  Estimated Length of Stay: 2          B Medical/Surgery History Past Medical History:  Diagnosis Date   Acquired absence of left great toe    Acquired absence of right leg below knee    Acute osteomyelitis of calcaneum, right 10/02/2016   Acute respiratory failure    Amputation stump infection 08/12/2018   Anemia    Basal cell carcinoma, eyelid    Cancer    Candidiasis 08/12/2018   CHF (congestive heart failure)    chronic diastolic    Chronic back pain    Chronic kidney disease     stage 3    Chronic multifocal osteomyelitis    of ankle and foot    Chronic pain syndrome    COPD (chronic obstructive pulmonary disease)    DDD (degenerative disc disease), lumbar    Depression    major depressive disorder    Diabetes mellitus without complication    type 2    Diabetic ulcer of toe of left foot 10/02/2016   Difficult intubation    Difficulty in walking, not elsewhere classified    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   Foot drop, bilateral    Foot drop, right 12/27/2015   GERD (gastroesophageal reflux disease)    Herpesviral vesicular dermatitis    History of COVID-19 03/15/2022   Hyperlipidemia    Hyperlipidemia    Hypertension    Insomnia    Malignant melanoma of other parts of face    Melanoma    MRSA bacteremia    Muscle weakness (generalized)    Nasal congestion    Necrosis of toe 07/08/2018   Neuromuscular dysfunction of bladder    Osteomyelitis    Osteomyelitis    Osteomyelitis    right BKA   Other idiopathic peripheral autonomic neuropathy    Retention of urine, unspecified    Squamous cell carcinoma of skin    Unsteadiness on feet    Urinary retention    Urinary retention    Urinary tract infection    Wears dentures    Wears glasses  Past Surgical History:  Procedure Laterality Date   AMPUTATION Left 03/29/2017   Procedure: LEFT GREAT TOE AMPUTATION AT METATARSOPHALANGEAL JOINT;  Surgeon: [name], MD;  Location: [place];  Service: Orthopedics;  Laterality: Left;   AMPUTATION Right 03/29/2017   Procedure: RIGHT BELOW KNEE AMPUTATION;  Surgeon: [name], MD;  Location: [place];  Service: Orthopedics;  Laterality: Right;   AMPUTATION Left 06/06/2018   Procedure: LEFT 2ND TOE AMPUTATION;  Surgeon: [name], MD;  Location: [place];  Service: Orthopedics;  Laterality: Left;   AMPUTATION Right 11/19/2018   Procedure: AMPUTATION ABOVE KNEE;  Surgeon: [name], MD;  Location: [place];  Service: Orthopedics;  Laterality: Right;   ANTERIOR  CERVICAL CORPECTOMY N/A 11/25/2015   Procedure: ANTERIOR CERVICAL FIVE CORPECTOMY Cervical four - six fusion;  Surgeon: Consuella Lose, MD;  Location: [place] [facility];  Service: Neurosurgery;  Laterality: N/A;  ANTERIOR CERVICAL FIVE CORPECTOMY Cervical four - six fusion   APPLICATION OF WOUND VAC Right 10/22/2018   Procedure: Application Of Wound Vac;  Surgeon: [name], MD;  Location: [place];  Service: Orthopedics;  Laterality: Right;   CYSTOSCOPY WITH BIOPSY N/A 05/01/2022   Procedure: CYSTOSCOPY WITH URETHRAL BIOPSY;  Surgeon: Robley Fries, MD;  Location: [facility];  Service: Urology;  Laterality: N/A;  1 HR   CYSTOSCOPY WITH RETROGRADE URETHROGRAM N/A 04/25/2018   Procedure: CYSTOSCOPY WITH RETROGRADE URETHROGRAM/ BALLOON DILATION;  Surgeon: Kathie Rhodes, MD;  Location: [facility];  Service: Urology;  Laterality: N/A;   CYSTOSCOPY WITH URETHRAL DILATATION N/A 05/01/2022   Procedure: BALLOON DILATION WITH  OPTILUME;  Surgeon: Robley Fries, MD;  Location: [facility];  Service: Urology;  Laterality: N/A;   MULTIPLE TOOTH EXTRACTIONS     POSTERIOR CERVICAL FUSION/FORAMINOTOMY N/A 11/29/2015   Procedure: Cervical Three-Cervical Seven Posterior Cervical Laminectomy with Fusion;  Surgeon: Consuella Lose, MD;  Location: [place] [facility];  Service: Neurosurgery;  Laterality: N/A;  Cervical Three-Cervical Seven Posterior Cervical Laminectomy with Fusion   STUMP REVISION Right 10/22/2018   Procedure: REVISION RIGHT BELOW KNEE AMPUTATION;  Surgeon: [name], MD;  Location: [place];  Service: Orthopedics;  Laterality: Right;   STUMP REVISION Right 12/05/2018   Procedure: Revision Right Above Knee Amputation;  Surgeon: [name], MD;  Location: [place];  Service: Orthopedics;  Laterality: Right;   STUMP REVISION Right 05/29/2019   Procedure: REVISION RIGHT ABOVE KNEE AMPUTATION;  Surgeon: [name], MD;  Location: [place];  Service: Orthopedics;  Laterality: Right;   STUMP REVISION Right 06/24/2019    Procedure: STUMP REVISION;  Surgeon: [name], MD;  Location: [place];  Service: Orthopedics;  Laterality: Right;     A IV Location/Drains/Wounds Patient Lines/Drains/Airways Status     Active Line/Drains/Airways     Name Placement date Placement time Site Days   Peripheral IV 09/18/22 20 G Right Antecubital 09/18/22  1606  Antecubital  less than 1   External Urinary Catheter 09/07/22  0626  --  11   Pressure Injury 09/05/22 Thigh Left;Posterior;Proximal Stage 2 -  Partial thickness loss of dermis presenting as a shallow open injury with a red, pink wound bed without slough. 09/05/22  2255  -- 13   Pressure Injury 09/05/22 Thigh Distal;Left;Posterior Stage 2 -  Partial thickness loss of dermis presenting as a shallow open injury with a red, pink wound bed without slough. 09/05/22  2250  -- 13   Pressure Injury 09/05/22 Ankle Anterior;Left Stage  2 -  Partial thickness loss of dermis presenting as a shallow open injury with a red, pink wound bed without slough. full thickness wound, NOT a pressure injury 09/05/22  2250  -- 13            Intake/Output Last 24 hours No intake or output data in the 24 hours ending 09/18/22 2016  Labs/Imaging Results for orders placed or performed during the hospital encounter of 09/18/22 (from the past 48 hour(s))  Comprehensive metabolic panel     Status: Abnormal   Collection Time: 09/18/22  5:01 PM  Result Value Ref Range   Sodium 134 (L) 135 - 145 mmol/L   Potassium 4.6 3.5 - 5.1 mmol/L    Comment: HEMOLYSIS AT THIS LEVEL MAY AFFECT RESULT   Chloride 104 98 - 111 mmol/L   CO2 22 22 - 32 mmol/L   Glucose, Bld 203 (H) 70 - 99 mg/dL    Comment: Glucose reference range applies only to samples taken after fasting for at least 8 hours.   BUN 25 (H) 8 - 23 mg/dL   Creatinine, Ser 2.10 (H) 0.61 - 1.24 mg/dL   Calcium 8.9 8.9 - 10.3 mg/dL   Total Protein 7.4 6.5 - 8.1 g/dL   Albumin 3.1 (L) 3.5 - 5.0 g/dL   AST 24 15 - 41 U/L    Comment:  HEMOLYSIS AT THIS LEVEL MAY AFFECT RESULT   ALT 10 0 - 44 U/L    Comment: HEMOLYSIS AT THIS LEVEL MAY AFFECT RESULT   Alkaline Phosphatase 63 38 - 126 U/L   Total Bilirubin 0.7 0.3 - 1.2 mg/dL    Comment: HEMOLYSIS AT THIS LEVEL MAY AFFECT RESULT   GFR, Estimated 35 (L) >60 mL/min    Comment: (NOTE) Calculated using the CKD-EPI Creatinine Equation (2021)    Anion gap 8 5 - 15    Comment: Performed at [facility], [place] [address], [place], [place] [address]  Lipase, blood     Status: None   Collection Time: 09/18/22  5:01 PM  Result Value Ref Range   Lipase 24 11 - 51 U/L    Comment: Performed at [facility] [address] [place], [place] [address]  CBC with Differential     Status: Abnormal   Collection Time: 09/18/22  5:01 PM  Result Value Ref Range   WBC 6.8 4.0 - 10.5 K/uL   RBC 3.99 (L) 4.22 - 5.81 MIL/uL   Hemoglobin 10.5 (L) 13.0 - 17.0 g/dL   HCT 34.7 (L) 39.0 - 52.0 %   MCV 87.0 80.0 - 100.0 fL   MCH 26.3 26.0 - 34.0 pg   MCHC 30.3 30.0 - 36.0 g/dL   RDW 16.1 (H) 11.5 - 15.5 %   Platelets 448 (H) 150 - 400 K/uL   nRBC 0.0 0.0 - 0.2 %   Neutrophils Relative % 61 %   Neutro Abs 4.2 1.7 - 7.7 K/uL   Lymphocytes Relative 27 %   Lymphs Abs 1.9 0.7 - 4.0 K/uL   Monocytes Relative 8 %   Monocytes Absolute 0.5 0.1 - 1.0 K/uL   Eosinophils Relative 2 %   Eosinophils Absolute 0.2 0.0 - 0.5 K/uL   Basophils Relative 1 %   Basophils Absolute 0.1 0.0 - 0.1 K/uL   Immature Granulocytes 1 %   Abs Immature Granulocytes 0.05 0.00 - 0.07 K/uL    Comment: Performed at [facility] [address], [place], [place] [address]  CBG monitoring, ED     Status: Abnormal   Collection Time: 09/18/22  5:17 PM  Result Value Ref Range   Glucose-Capillary 171 (H) 70 - 99 mg/dL    Comment: Glucose reference range applies only to samples taken after fasting for at least 8 hours.   CT Head Wo Contrast  Result Date: 09/18/2022 CLINICAL DATA:  Neurologic deficit, history  of melanoma EXAM: CT HEAD WITHOUT CONTRAST TECHNIQUE: Contiguous axial images were obtained from the base of the skull through the vertex without intravenous contrast. RADIATION DOSE REDUCTION: This exam was performed according to the departmental dose-optimization program which includes automated exposure control, adjustment of the mA and/or kV according to patient size and/or use of iterative reconstruction technique. COMPARISON:  01/24/2022 FINDINGS: Brain: Hypodensities within the right occipital cortex and subcortical white matter are seen, new since prior study, consistent with subacute to chronic ischemic changes. No evidence of acute infarct or hemorrhage. Lateral ventricles and midline structures are unremarkable. There are no acute extra-axial fluid collections. No mass effect. Vascular: Atherosclerosis of the left vertebral artery and internal carotid arteries. No hyperdense vessel. Skull: Normal. Negative for fracture or focal lesion. Sinuses/Orbits: No acute finding. Stable postsurgical changes within the right infraorbital soft tissues consistent with prior melanoma resection. The associated soft tissue edema seen on prior exam has resolved in the interim. Other: Note is made of marked fatty atrophy of the tongue, asymmetric on the left, new since prior study. IMPRESSION: 1. New hypodensities within the right occipital cortex and subcortical white matter, with an appearance most consistent with subacute to chronic ischemic changes. 2. No evidence of acute infarct or hemorrhage. 3. Interval development of marked fatty atrophy of the tongue, left greater than right. 4. Postsurgical changes of the right cheek related to prior melanoma resection. Electronically Signed   By: Randa Ngo M.D.   On: 09/18/2022 17:33    Pending Labs Unresulted Labs (From admission, onward)     Start     Ordered   09/19/22 0500  Prealbumin  Tomorrow morning,   R        09/18/22 1939   09/19/22 0500  Lipid panel   Tomorrow morning,   R        09/18/22 1940   09/18/22 2013  Prealbumin  Add-on,   AD        09/18/22 2013   09/18/22 2005  MRSA Next Gen by PCR, Nasal  Once,   R       Question Answer Comment  Patient immune status Normal   Release to patient Immediate      09/18/22 2005   09/18/22 2000  C-reactive protein  Add-on,   AD        09/18/22 1959   09/18/22 1959  Sedimentation rate  Add-on,   AD        09/18/22 1959   09/18/22 1940  Blood gas, venous  ONCE - STAT,   STAT       Question:  Release to patient  Answer:  Immediate   09/18/22 1939   09/18/22 1940  CK  Add-on,   AD        09/18/22 1939   09/18/22 1940  Creatinine, urine, random  Once,   URGENT        09/18/22 1939   09/18/22 1940  Osmolality, urine  Once,   URGENT        09/18/22 1939   09/18/22 1940  Osmolality  Add-on,   AD  09/18/22 1939   09/18/22 1940  Sodium, urine, random  Once,   URGENT        09/18/22 1939   09/18/22 1940  Protime-INR  Once,   STAT       Question:  Release to patient  Answer:  Immediate   09/18/22 1939   09/18/22 1940  Magnesium  Add-on,   AD        09/18/22 1939   09/18/22 1940  Phosphorus  Add-on,   AD        09/18/22 1939   09/18/22 1940  TSH  Add-on,   AD        09/18/22 1939   09/18/22 1938  Urinalysis, Routine w reflex microscopic -Urine, Clean Catch  Once,   URGENT       Question:  Specimen Source  Answer:  Urine, Clean Catch   09/18/22 1937   Signed and Held  Comprehensive metabolic panel  (Labs)  Tomorrow morning,   R        Signed and Held   Signed and Held  CBC  (Labs)  Tomorrow morning,   R        Signed and Held   Signed and Held  Urine rapid drug screen (hosp performed)not at [facility])  Once,   R        Signed and Held   Signed and Held  Blood Alcohol Level  (Labs)  Add-on,   R        Signed and Held   Signed and Held  Hemoglobin A1c  (Labs)  Add-on,   R       Comments: To assess prior glycemic control    Signed and Held            Vitals/Pain Today's  Vitals   09/18/22 1600 09/18/22 1604 09/18/22 1700  BP: (!) 180/88  (!) 149/79  Pulse: 85  76  Resp: 18  20  Temp: 98 F (36.7 C)    TempSrc: Oral    SpO2: 95%  93%  Weight:  112.1 kg   Height:  5\' 7"  (1.702 m)   PainSc:  7      Isolation Precautions No active isolations  Medications Medications  insulin aspart (novoLOG) injection 0-9 Units (has no administration in time range)  atorvastatin (LIPITOR) tablet 80 mg (has no administration in time range)  nicotine (NICODERM CQ - dosed in mg/24 hours) patch 21 mg (has no administration in time range)  cefTRIAXone (ROCEPHIN) 2 g in sodium chloride 0.9 % 100 mL IVPB (has no administration in time range)  metroNIDAZOLE (FLAGYL) IVPB 500 mg (has no administration in time range)  ondansetron (ZOFRAN) injection 4 mg (4 mg Intravenous Given 09/18/22 1700)  oxyCODONE (Oxy IR/ROXICODONE) immediate release tablet 20 mg (20 mg Oral Given 09/18/22 1731)  aspirin chewable tablet 81 mg (81 mg Oral Given 09/18/22 1949)  LORazepam (ATIVAN) tablet 1 mg (1 mg Oral Given 09/18/22 1957)    Mobility power wheelchair     Focused Assessments    R Recommendations: See Admitting Provider Note  Report given to:   Additional Notes:

## 2022-09-18 NOTE — Progress Notes (Signed)
Patient refused to lay on MRI scan table.  Patient stated he could not and would not lay flat.

## 2022-09-18 NOTE — ED Triage Notes (Addendum)
From [facility]. Patient states that he cannot see and that all vision is just black, but was driving his motorized wheelchair around upon EMS arrival. Hx of vision loss due to blood clot previously. Vision changes started a few weeks ago per patient report, but worse today. EMS also reports foul smelling urine.

## 2022-09-18 NOTE — Assessment & Plan Note (Signed)
-   Spoke about importance of quitting spent 5 minutes discussing options for treatment, prior attempts at quitting, and dangers of smoking ? -At this point patient is    NOT  interested in quitting ? - order nicotine patch  ? - nursing tobacco cessation protocol ? ?

## 2022-09-18 NOTE — Assessment & Plan Note (Signed)
Chronic stable continue home meds, O2 as needed

## 2022-09-18 NOTE — Progress Notes (Signed)
Patient refused vitals.

## 2022-09-18 NOTE — Progress Notes (Signed)
Patient refused his glucose to be checked and his insulin. Dr. Bridgett Larsson aware.

## 2022-09-18 NOTE — Assessment & Plan Note (Signed)
Chronic-stable.

## 2022-09-18 NOTE — Consult Note (Signed)
NEURO HOSPITALIST CONSULT NOTE   Requestig physician: Dr. Maylon Peppers  Reason for Consult: Vision loss  History obtained from:  Patient and Chart     HPI:                                                                                                                                          ALMEDIN GERLT is an 62 y.o. male with a PMHx of DM2, diabetic ulcer, COPD, depression, chronic right foot osteomyelitis, right BKA, left great toe amputation, stump infection, prior episode of acute respiratory failure, CHF, anemia, cancer, CKD, chronic back pain, epidural abscess, bilateral foot drop, HSV dermatitis, HLD, HTN, malignant melanoma, MRSA bacteremia, neurogenic bladder, idiopathic peripheral autonomic neuropathy, anterior cervical corpectomy and recent admissions for severe sepsis due to LLE cellulitis and multifocal PNA as well as acute on chronic hypoxic respiratory failure who presents to the ED from [facility] today with acute onset of vision loss. He states that he has had progressively worsening vision changes that started about 2 weeks ago that worsened today. He does have prior history of vision loss due to "blood clot" On EMS arrival to his facility, the patient wad driving his motorized wheelchair around, but was also stating that his vision was "just black". He states that he can now only see shapes from his bilateral eyes. There is no associated eye pain. He has been nauseous but denies any vomiting. No limb weakness or numbness. He has not seen his optometrist since the vision loss began.   CT in the ED revealed a new hypodensity in the right occipital region concerning for subacute vs chronic infarct.   Past Medical History:  Diagnosis Date   Acquired absence of left great toe    Acquired absence of right leg below knee    Acute osteomyelitis of calcaneum, right 10/02/2016   Acute respiratory failure    Amputation stump infection 08/12/2018   Anemia    Basal  cell carcinoma, eyelid    Cancer    Candidiasis 08/12/2018   CHF (congestive heart failure)    chronic diastolic    Chronic back pain    Chronic kidney disease    stage 3    Chronic multifocal osteomyelitis    of ankle and foot    Chronic pain syndrome    COPD (chronic obstructive pulmonary disease)    DDD (degenerative disc disease), lumbar    Depression    major depressive disorder    Diabetes mellitus without complication    type 2    Diabetic ulcer of toe of left foot 10/02/2016   Difficult intubation    Difficulty in walking, not elsewhere classified    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   Foot drop, bilateral    Foot drop, right 12/27/2015  GERD (gastroesophageal reflux disease)    Herpesviral vesicular dermatitis    History of COVID-19 03/15/2022   Hyperlipidemia    Hyperlipidemia    Hypertension    Insomnia    Malignant melanoma of other parts of face    Melanoma    MRSA bacteremia    Muscle weakness (generalized)    Nasal congestion    Necrosis of toe 07/08/2018   Neuromuscular dysfunction of bladder    Osteomyelitis    Osteomyelitis    Osteomyelitis    right BKA   Other idiopathic peripheral autonomic neuropathy    Retention of urine, unspecified    Squamous cell carcinoma of skin    Unsteadiness on feet    Urinary retention    Urinary retention    Urinary tract infection    Wears dentures    Wears glasses     Past Surgical History:  Procedure Laterality Date   AMPUTATION Left 03/29/2017   Procedure: LEFT GREAT TOE AMPUTATION AT METATARSOPHALANGEAL JOINT;  Surgeon: [name], MD;  Location: [place];  Service: Orthopedics;  Laterality: Left;   AMPUTATION Right 03/29/2017   Procedure: RIGHT BELOW KNEE AMPUTATION;  Surgeon: [name], MD;  Location: [place];  Service: Orthopedics;  Laterality: Right;   AMPUTATION Left 06/06/2018   Procedure: LEFT 2ND TOE AMPUTATION;  Surgeon: [name], MD;  Location: [place];  Service: Orthopedics;  Laterality:  Left;   AMPUTATION Right 11/19/2018   Procedure: AMPUTATION ABOVE KNEE;  Surgeon: [name], MD;  Location: [place];  Service: Orthopedics;  Laterality: Right;   ANTERIOR CERVICAL CORPECTOMY N/A 11/25/2015   Procedure: ANTERIOR CERVICAL FIVE CORPECTOMY Cervical four - six fusion;  Surgeon: Consuella Lose, MD;  Location: [place] [facility];  Service: Neurosurgery;  Laterality: N/A;  ANTERIOR CERVICAL FIVE CORPECTOMY Cervical four - six fusion   APPLICATION OF WOUND VAC Right 10/22/2018   Procedure: Application Of Wound Vac;  Surgeon: [name], MD;  Location: [place];  Service: Orthopedics;  Laterality: Right;   CYSTOSCOPY WITH BIOPSY N/A 05/01/2022   Procedure: CYSTOSCOPY WITH URETHRAL BIOPSY;  Surgeon: Robley Fries, MD;  Location: [facility];  Service: Urology;  Laterality: N/A;  1 HR   CYSTOSCOPY WITH RETROGRADE URETHROGRAM N/A 04/25/2018   Procedure: CYSTOSCOPY WITH RETROGRADE URETHROGRAM/ BALLOON DILATION;  Surgeon: Kathie Rhodes, MD;  Location: [facility];  Service: Urology;  Laterality: N/A;   CYSTOSCOPY WITH URETHRAL DILATATION N/A 05/01/2022   Procedure: BALLOON DILATION WITH  OPTILUME;  Surgeon: Robley Fries, MD;  Location: [facility];  Service: Urology;  Laterality: N/A;   MULTIPLE TOOTH EXTRACTIONS     POSTERIOR CERVICAL FUSION/FORAMINOTOMY N/A 11/29/2015   Procedure: Cervical Three-Cervical Seven Posterior Cervical Laminectomy with Fusion;  Surgeon: Consuella Lose, MD;  Location: [place] [facility];  Service: Neurosurgery;  Laterality: N/A;  Cervical Three-Cervical Seven Posterior Cervical Laminectomy with Fusion   STUMP REVISION Right 10/22/2018   Procedure: REVISION RIGHT BELOW KNEE AMPUTATION;  Surgeon: [name], MD;  Location: [place];  Service: Orthopedics;  Laterality: Right;   STUMP REVISION Right 12/05/2018   Procedure: Revision Right Above Knee Amputation;  Surgeon: [name], MD;  Location: [place];  Service: Orthopedics;  Laterality: Right;   STUMP REVISION Right 05/29/2019    Procedure: REVISION RIGHT ABOVE KNEE AMPUTATION;  Surgeon: [name], MD;  Location: [place];  Service: Orthopedics;  Laterality: Right;   STUMP REVISION Right 06/24/2019   Procedure: STUMP REVISION;  Surgeon: [name],  Illene Regulus, MD;  Location: [place];  Service: Orthopedics;  Laterality: Right;    Family History  Problem Relation Age of Onset   Diabetes Mother    Heart disease Mother    Cancer Mother    Cancer Father    Bone cancer Father    Prostate cancer Father    Cancer Paternal Grandfather    Cancer Other    Diabetes Other             Social History:  reports that he has been smoking cigarettes. He has been smoking an average of .5 packs per day. He has never used smokeless tobacco. He reports that he does not drink alcohol and does not use drugs.  Allergies  Allergen Reactions   Latex Other (See Comments)    "ALLERGIC," per [facility]    HOME MEDICATIONS:                                                                                                                      No current facility-administered medications on file prior to encounter.   Current Outpatient Medications on File Prior to Encounter  Medication Sig Dispense Refill   Amino Acids-Protein Hydrolys (PRO-STAT) LIQD Take 30 mLs by mouth in the morning.     cetirizine (ZYRTEC) 10 MG tablet Take 10 mg by mouth daily.     cyanocobalamin 1000 MCG tablet Take 1,000 mcg by mouth daily.     docusate sodium (COLACE) 100 MG capsule Take 1 capsule (100 mg total) by mouth 2 (two) times daily. 10 capsule 0   fenofibrate (TRICOR) 48 MG tablet Take 48 mg by mouth daily.     ferrous sulfate 325 (65 FE) MG tablet Take 1 tablet (325 mg total) by mouth daily with breakfast. 30 tablet 0   fluticasone (FLONASE) 50 MCG/ACT nasal spray Place 1 spray into both nostrils in the morning.     furosemide (LASIX) 40 MG tablet Take 1 tablet (40 mg total) by mouth daily.     Glucagon, rDNA, (GLUCAGON EMERGENCY) 1 MG KIT Inject 1 mg into the vein as  needed (for hypolycemia).     insulin glargine (LANTUS SOLOSTAR) 100 UNIT/ML Solostar Pen Inject 15 Units into the skin daily.     insulin lispro (HUMALOG) 100 UNIT/ML KwikPen Inject 0-10 Units into the skin See admin instructions. Inject 0-10 units into the skin three times a day and at bedtime, PER SLIDING SCALE:  BGL 151-200 = 2 units 201-250 = 4 units 251-300 = 6 units 301-350 =  8 units 351-400 = 10 units (Patient taking differently: Inject 3-20 Units into the skin See admin instructions. Inject 3-20 units into the skin before meals and at bedtime, PER SLIDING SCALE: BGL 121-150 = 3 units; 151-200 = 4 units; 201-250 = 7 units; 251-300 = 11 units; 301-350 = 15 units; 351-400 = 20 units; >400 = CALL MD) 15 mL 11   ipratropium-albuterol (DUONEB) 0.5-2.5 (3) MG/3ML SOLN Take 3 mLs by nebulization every  6 (six) hours as needed (for shortness of breath).     lactulose (CHRONULAC) 10 GM/15ML solution Take 15 mLs (10 g total) by mouth 3 (three) times daily as needed for moderate constipation. (Patient taking differently: Take 10 g by mouth every 8 (eight) hours as needed for moderate constipation.) 236 mL 0   leptospermum manuka honey (MEDIHONEY) PSTE paste Apply 1 Application topically daily.     Lidocaine 4 % PTCH Apply 1 patch topically See admin instructions. Apply 1 patch to both the right shoulder and the right stump at 9 AM daily- remove at 9 PM     LORazepam (ATIVAN) 0.5 MG tablet Take 1 tablet (0.5 mg total) by mouth every 6 (six) hours as needed for anxiety. 15 tablet 0   magnesium oxide (MAG-OX) 400 MG tablet Take 400 mg by mouth 2 (two) times daily.     melatonin 3 MG TABS tablet Take 3 mg by mouth at bedtime.     Menthol, Topical Analgesic, (BIOFREEZE) 4 % GEL Apply 1 Application topically See admin instructions. Apply to right leg/stump every 8 hours as needed for pain     Meth-Hyo-M Bl-Na Phos-Ph Sal (URIBEL) 118 MG CAPS Take 1 capsule by mouth every 8 (eight) hours as needed (bladder  pain).     methenamine (MANDELAMINE) 1 g tablet Take 1 g by mouth daily.     metoprolol tartrate (LOPRESSOR) 25 MG tablet Take 0.5 tablets (12.5 mg total) by mouth 2 (two) times daily. 30 tablet 0   mirabegron ER (MYRBETRIQ) 50 MG TB24 tablet Take 50 mg by mouth daily.     Multiple Vitamins-Minerals (MULTIVITAMIN WITH MINERALS) tablet Take 1 tablet by mouth daily.     Multiple Vitamins-Minerals (PRESERVISION AREDS 2) CAPS Take 1 capsule by mouth in the morning and at bedtime.     naloxone (NARCAN) 4 MG/0.1ML LIQD nasal spray kit Place 1 spray into the nose as needed (poorly responsive or turning blue).     nystatin cream (MYCOSTATIN) Apply 1 Application topically See admin instructions. Apply to wound site every day shift and as needed, for wound care     omeprazole (PRILOSEC) 20 MG capsule Take 20 mg by mouth daily.     ondansetron (ZOFRAN) 4 MG tablet Take 4 mg by mouth every 8 (eight) hours as needed for nausea.     oxybutynin (DITROPAN-XL) 10 MG 24 hr tablet Take 10 mg by mouth daily.     Oxycodone HCl 20 MG TABS Take 1 tablet (20 mg total) by mouth every 8 (eight) hours as needed (severe pain). 10 tablet 0   OXYGEN Inhale 2 L/min into the lungs as needed (for shortness of breath- to maintain sats >90%).     oxymetazoline (AFRIN 12 HOUR) 0.05 % nasal spray Place 1 spray into both nostrils every 12 (twelve) hours as needed for congestion.     Polyethyl Glycol-Propyl Glycol (LUBRICANT EYE DROPS) 0.4-0.3 % SOLN Place 2 drops into both eyes every 4 (four) hours as needed (dryness/itching).     polyethylene glycol (MIRALAX / GLYCOLAX) 17 g packet Take 17 g by mouth daily as needed for mild constipation.     rosuvastatin (CRESTOR) 10 MG tablet Take 10 mg by mouth at bedtime.     senna (SENOKOT) 8.6 MG TABS tablet Take 1 tablet by mouth at bedtime.     SPIRIVA RESPIMAT 1.25 MCG/ACT AERS Inhale 1 each into the lungs in the morning.     tamsulosin (FLOMAX) 0.4 MG CAPS capsule  Take 0.8 mg by mouth  daily.     TRULICITY [id number] SOPN Inject 0.75 mg into the skin every Wednesday.       ROS:                                                                                                                                       Has chronic pain involving the stump of his right AKA. Other ROS as per HPI.    Blood pressure (!) 149/79, pulse 76, temperature 98 F (36.7 C), temperature source Oral, resp. rate 20, height 5\' 7"  (1.702 m), weight 112.1 kg, SpO2 93 %.   General Examination:                                                                                                       Physical Exam  HEENT-  [place]/AT   Lungs- Respirations unlabored Extremities- Right AKA. Left lower extremity with chronic appearing nonpitting edema and excoriations, rated as moderated to severe.    Neurological Examination Mental Status: Awake and alert. Oriented x 5. Speech fluent without evidence of aphasia. Able to follow all commands without difficulty. Cranial Nerves: II: Left pupil 2 mm and unreactive. Right pupil 3 mm and reactive. 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 Testing of visual fields of right eye reveals left hemianopsia.   III,IV, VI: Chronic left ptosis. Right eye no ptosis. Exotropia noted with decreased excursion of left eye when tracking. EOM of right eye are normal. No nystagmus.  V: Temp sensation equal bilaterally.  VII: Smile symmetric VIII: Hearing intact to voice IX,X: No hypophonia or hoarseness XI: Head is midline XII: midline tongue extension Motor: RUE 5/5 proximally and distally LUE 5/5 proximally and distally RLE stump 5/5 flexion and extension LLE 5/5 proximally and distally Sensory: FT intact to BUE, right LE stump and LLE Deep Tendon Reflexes: Hypoactive left patellar and achilles. 1+ bilateral brachioradialis.  Cerebellar: No ataxia with FNF bilaterally.  Gait: Unable to assess   Lab Results: Basic Metabolic Panel: Recent Labs  Lab  09/13/22 0829 09/13/22 0924 09/18/22 1701  NA 134* 137 134*  K 4.8 4.6 4.6  CL 97*  --  104  CO2 24  --  22  GLUCOSE 174*  --  203*  BUN 23  --  25*  CREATININE 1.96*  --  2.10*  CALCIUM 9.2  --  8.9    CBC: Recent Labs  Lab 09/13/22 0829 09/13/22 0924 09/18/22 1701  WBC 7.6  --  6.8  NEUTROABS 5.1  --  4.2  HGB 9.9* 11.2* 10.5*  HCT 32.9* 33.0* 34.7*  MCV 86.4  --  87.0  PLT 317  --  448*    Cardiac Enzymes: No results for input(s): "CKTOTAL", "CKMB", "CKMBINDEX", "TROPONINI" in the last 168 hours.  Lipid Panel: No results for input(s): "CHOL", "TRIG", "HDL", "CHOLHDL", "VLDL", "LDLCALC" in the last 168 hours.  Imaging: CT Head Wo Contrast  Result Date: 09/18/2022 CLINICAL DATA:  Neurologic deficit, history of melanoma EXAM: CT HEAD WITHOUT CONTRAST TECHNIQUE: Contiguous axial images were obtained from the base of the skull through the vertex without intravenous contrast. RADIATION DOSE REDUCTION: This exam was performed according to the departmental dose-optimization program which includes automated exposure control, adjustment of the mA and/or kV according to patient size and/or use of iterative reconstruction technique. COMPARISON:  01/24/2022 FINDINGS: Brain: Hypodensities within the right occipital cortex and subcortical white matter are seen, new since prior study, consistent with subacute to chronic ischemic changes. No evidence of acute infarct or hemorrhage. Lateral ventricles and midline structures are unremarkable. There are no acute extra-axial fluid collections. No mass effect. Vascular: Atherosclerosis of the left vertebral artery and internal carotid arteries. No hyperdense vessel. Skull: Normal. Negative for fracture or focal lesion. Sinuses/Orbits: No acute finding. Stable postsurgical changes within the right infraorbital soft tissues consistent with prior melanoma resection. The associated soft tissue edema seen on prior exam has resolved in the interim. Other:  Note is made of marked fatty atrophy of the tongue, asymmetric on the left, new since prior study. IMPRESSION: 1. New hypodensities within the right occipital cortex and subcortical white matter, with an appearance most consistent with subacute to chronic ischemic changes. 2. No evidence of acute infarct or hemorrhage. 3. Interval development of marked fatty atrophy of the tongue, left greater than right. 4. Postsurgical changes of the right cheek related to prior melanoma resection. Electronically Signed   By: Randa Ngo M.D.   On: 09/18/2022 17:33     Assessment: 62 year old male with a complex PMHx including multiple stroke risk factors who presents with a 2 week history of vision loss. CT reveals late subacute right occipital lobe strokes.  - CT head:  New hypodensities within the right occipital cortex and subcortical white matter, with an appearance most consistent with late subacute to chronic cortically based ischemic infarctions. 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  - Also seen on CT is interval development of marked fatty atrophy of the tongue, left greater than right, as well as postsurgical changes of the right cheek related to prior melanoma resection. - DDx for stroke etiology includes cardiobembolic, artery-to-artery embolization and in situ thrombosis.   Recommendations: - Will need admission for stroke work up to include MRI brain, MRA head, carotid ultrasound, TTE and cardiac telemetry - Start ASA. Will hold off on adding Plavix as he has a history of PUD and chronic iron deficiency anemia with slow leak and reccurent bleeds - HgbA1c, fasting lipid panel - MRI of the brain without contrast - PT consult, OT consult, Speech consult - TTE - Statin. Obtain CK level.  - BP management. Out of the permissive HTN time window.   - Risk factor modification - Frequent neuro  checks - NPO until passes stroke swallow screen - Stroke Team to follow in the AM  Electronically signed: Dr. Kerney Elbe 09/18/2022, 7:28 PM

## 2022-09-18 NOTE — Progress Notes (Signed)
Received handoff report from ED nurse Caryl Pina, [place]. Awaiting for patient to be transported to the unit.

## 2022-09-18 NOTE — Assessment & Plan Note (Signed)
Obtain lipid panel would probably benefit from changing statin to lipitor 80 g po qday

## 2022-09-18 NOTE — Progress Notes (Addendum)
Pharmacy Antibiotic Note  Gregory [name] is a 62 y.o. male admitted on 09/18/2022 with Suspected diabetic foot infection, hHigh risk for MRSA. Patient is from a SNF.  Pharmacy has been consulted for vancomycin dosing x7 days. Patient is also on ceftriaxone and metronidazole.  4/2 Vancomycin 1000mg  Q 24 hr Scr used: 2.1 mg/dL Weight: 112.1 kg Vd coeff: 0.5 L/kg Est AUC: 551  Plan: Vancomycin 1500mg  x1 then 1000mg  q24hr Ceftriaxone and metronidazole per team Monitor cultures, clinical status, renal function, vancomycin level Narrow abx as able and f/u duration    Height: 5\' 7"  (170.2 cm) Weight: 112.1 kg (247 lb 2.2 oz) IBW/kg (Calculated) : 66.1  Temp (24hrs), Avg:98 F (36.7 C), Min:98 F (36.7 C), Max:98 F (36.7 C)  Recent Labs  Lab 09/13/22 0829 09/18/22 1701  WBC 7.6 6.8  CREATININE 1.96* 2.10*    Estimated Creatinine Clearance: 43 mL/min (A) (by C-G formula based on SCr of 2.1 mg/dL (H)).    Allergies  Allergen Reactions   Latex Other (See Comments)    "ALLERGIC," per [facility]    Antimicrobials this admission: Vanc  4/2 >> 4/9 CTX 4/2 >>  MTZ 4/2>>  Dose adjustments this admission: N/a  Microbiology results: 4/2 BCx: pend   Thank you for allowing pharmacy to be a part of this patient's care.  Benetta Spar, PharmD, BCPS, BCCP Clinical Pharmacist  Please check AMION for all [place] phone numbers After 10:00 PM, call [place] [phone]

## 2022-09-18 NOTE — Assessment & Plan Note (Signed)
Allow permissive htn ?

## 2022-09-18 NOTE — Assessment & Plan Note (Signed)
Continue protonix 40 mg po bid

## 2022-09-19 ENCOUNTER — Inpatient Hospital Stay (HOSPITAL_COMMUNITY): Payer: Medicaid Other

## 2022-09-19 DIAGNOSIS — I6389 Other cerebral infarction: Secondary | ICD-10-CM | POA: Diagnosis not present

## 2022-09-19 DIAGNOSIS — H547 Unspecified visual loss: Secondary | ICD-10-CM | POA: Diagnosis not present

## 2022-09-19 DIAGNOSIS — H539 Unspecified visual disturbance: Secondary | ICD-10-CM

## 2022-09-19 DIAGNOSIS — R609 Edema, unspecified: Secondary | ICD-10-CM | POA: Diagnosis not present

## 2022-09-19 DIAGNOSIS — L97909 Non-pressure chronic ulcer of unspecified part of unspecified lower leg with unspecified severity: Secondary | ICD-10-CM | POA: Diagnosis not present

## 2022-09-19 DIAGNOSIS — R2990 Unspecified symptoms and signs involving the nervous system: Secondary | ICD-10-CM | POA: Diagnosis not present

## 2022-09-19 DIAGNOSIS — I639 Cerebral infarction, unspecified: Secondary | ICD-10-CM

## 2022-09-19 DIAGNOSIS — L97321 Non-pressure chronic ulcer of left ankle limited to breakdown of skin: Secondary | ICD-10-CM | POA: Diagnosis not present

## 2022-09-19 DIAGNOSIS — E11622 Type 2 diabetes mellitus with other skin ulcer: Secondary | ICD-10-CM | POA: Diagnosis not present

## 2022-09-19 DIAGNOSIS — E1149 Type 2 diabetes mellitus with other diabetic neurological complication: Secondary | ICD-10-CM | POA: Diagnosis not present

## 2022-09-19 LAB — RAPID URINE DRUG SCREEN, HOSP PERFORMED
Amphetamines: NOT DETECTED
Barbiturates: NOT DETECTED
Benzodiazepines: NOT DETECTED
Cocaine: NOT DETECTED
Opiates: POSITIVE — AB
Tetrahydrocannabinol: POSITIVE — AB

## 2022-09-19 LAB — URINALYSIS, ROUTINE W REFLEX MICROSCOPIC
Bacteria, UA: NONE SEEN
Bilirubin Urine: NEGATIVE
Glucose, UA: NEGATIVE mg/dL
Hgb urine dipstick: NEGATIVE
Ketones, ur: NEGATIVE mg/dL
Leukocytes,Ua: NEGATIVE
Nitrite: NEGATIVE
Protein, ur: >=300 mg/dL — AB
Specific Gravity, Urine: 1.013 (ref 1.005–1.030)
pH: 6 (ref 5.0–8.0)

## 2022-09-19 LAB — SODIUM, URINE, RANDOM: Sodium, Ur: 59 mmol/L

## 2022-09-19 LAB — GLUCOSE, CAPILLARY
Glucose-Capillary: 141 mg/dL — ABNORMAL HIGH (ref 70–99)
Glucose-Capillary: 133 mg/dL — ABNORMAL HIGH (ref 70–99)
Glucose-Capillary: 159 mg/dL — ABNORMAL HIGH (ref 70–99)
Glucose-Capillary: 139 mg/dL — ABNORMAL HIGH (ref 70–99)

## 2022-09-19 LAB — OSMOLALITY, URINE: Osmolality, Ur: 402 mOsm/kg (ref 300–900)

## 2022-09-19 LAB — ECHOCARDIOGRAM COMPLETE
AR max vel: 2.79 cm2
AV Area VTI: 3.48 cm2
AV Area mean vel: 2.96 cm2
AV Mean grad: 9 mmHg
AV Peak grad: 20 mmHg
Ao pk vel: 2.24 m/s
Area-P 1/2: 2.35 cm2
Calc EF: 58.2 %
Height: 67 in
P 1/2 time: 570 msec
S' Lateral: 3.9 cm
Single Plane A2C EF: 62.1 %
Single Plane A4C EF: 52.9 %
Weight: 3954.17 oz

## 2022-09-19 LAB — CREATININE, URINE, RANDOM: Creatinine, Urine: 99 mg/dL

## 2022-09-19 LAB — MRSA NEXT GEN BY PCR, NASAL: MRSA by PCR Next Gen: NOT DETECTED

## 2022-09-19 MED ORDER — LORAZEPAM 2 MG/ML IJ SOLN
1.0000 mg | Freq: Once | INTRAMUSCULAR | Status: AC
  Administered 2022-09-19: 1 mg via INTRAVENOUS
  Filled 2022-09-19: qty 1

## 2022-09-19 MED ORDER — ADULT MULTIVITAMIN W/MINERALS CH
1.0000 | ORAL_TABLET | Freq: Every day | ORAL | Status: DC
  Administered 2022-09-19 – 2022-09-21 (×3): 1 via ORAL
  Filled 2022-09-19 (×4): qty 1

## 2022-09-19 MED ORDER — TRAMADOL HCL 50 MG PO TABS
100.0000 mg | ORAL_TABLET | Freq: Four times a day (QID) | ORAL | Status: DC | PRN
  Administered 2022-09-19 – 2022-09-20 (×4): 100 mg via ORAL
  Filled 2022-09-19 (×5): qty 2

## 2022-09-19 MED ORDER — LORAZEPAM 2 MG/ML PO CONC
1.0000 mg | ORAL | Status: AC | PRN
  Administered 2022-09-19: 1 mg via ORAL
  Filled 2022-09-19: qty 1

## 2022-09-19 MED ORDER — METRONIDAZOLE 500 MG PO TABS
500.0000 mg | ORAL_TABLET | Freq: Two times a day (BID) | ORAL | Status: AC
  Administered 2022-09-19 – 2022-09-21 (×5): 500 mg via ORAL
  Filled 2022-09-19 (×5): qty 1

## 2022-09-19 MED ORDER — MUPIROCIN CALCIUM 2 % EX CREA
TOPICAL_CREAM | Freq: Every day | CUTANEOUS | Status: DC
  Filled 2022-09-19 (×2): qty 15

## 2022-09-19 MED ORDER — HYDROCODONE-ACETAMINOPHEN 5-325 MG PO TABS
1.0000 | ORAL_TABLET | Freq: Four times a day (QID) | ORAL | Status: DC | PRN
  Administered 2022-09-19 – 2022-09-21 (×8): 1 via ORAL
  Filled 2022-09-19 (×9): qty 1

## 2022-09-19 MED ORDER — PROSOURCE PLUS PO LIQD
30.0000 mL | Freq: Two times a day (BID) | ORAL | Status: DC
  Administered 2022-09-20 (×2): 30 mL via ORAL
  Filled 2022-09-19 (×3): qty 30

## 2022-09-19 MED ORDER — JUVEN PO PACK
1.0000 | PACK | Freq: Two times a day (BID) | ORAL | Status: DC
  Administered 2022-09-20 – 2022-09-21 (×3): 1
  Filled 2022-09-19 (×3): qty 1

## 2022-09-19 NOTE — TOC Initial Note (Signed)
Transition of Care [facility]) - Initial/Assessment Note    Patient Details  Name: Gregory [name] MRN: [id number] Date of Birth: [birth year]  Transition of [facility]) CM/SW Contact:    Geralynn Ochs, LCSW Phone Number: 09/19/2022, 9:44 AM  Clinical Narrative:          Patient is long term care resident at [facility], they are able to accept patient back when stable. CSW to follow.         Expected Discharge Plan: Skilled Nursing Facility Barriers to Discharge: Continued Medical Work up   Patient Goals and CMS Choice Patient states their goals for this hospitalization and ongoing recovery are:: return to SNF CMS Medicare.gov Compare Post Acute Care list provided to:: Patient Choice offered to / list presented to : Patient [place] ownership interest in [facility].provided to:: Patient    Expected Discharge Plan and [place] Choice: [place] Living arrangements for the past 2 months: [place]                                      Prior Living Arrangements/Services Living arrangements for the past 2 months: [place] Lives with:: Facility Resident Patient language and need for interpreter reviewed:: No Do you feel safe going back to the place where you live?: Yes      Need for Family Participation in Patient Care: No (Comment) Care giver support system in place?: Yes (comment)   Criminal Activity/Legal Involvement Pertinent to Current Situation/Hospitalization: No - Comment as needed  Activities of Daily Living Home Assistive Devices/Equipment: Wheelchair ADL Screening (condition at time of admission) Patient's cognitive ability adequate to safely complete daily activities?: Yes Is the patient deaf or have difficulty hearing?: Yes Does the patient have difficulty seeing, even when wearing glasses/contacts?: Yes Does the patient have difficulty concentrating, remembering, or making  decisions?: No Patient able to express need for assistance with ADLs?: Yes Does the patient have difficulty dressing or bathing?: Yes Independently performs ADLs?: No Communication: Independent Dressing (OT): Needs assistance Is this a change from baseline?: Pre-admission baseline Grooming: Needs assistance Is this a change from baseline?: Pre-admission baseline Feeding: Independent Bathing: Needs assistance Is this a change from baseline?: Pre-admission baseline Toileting: Needs assistance Is this a change from baseline?: Pre-admission baseline In/Out Bed: Needs assistance Is this a change from baseline?: Pre-admission baseline Walks in Home: Dependent Is this a change from baseline?: Pre-admission baseline Does the patient have difficulty walking or climbing stairs?: Yes Weakness of Legs: Right (AKA) Weakness of Arms/Hands: None  Permission Sought/Granted Permission sought to share information with : Facility Art therapist granted to share information with : Yes, Verbal Permission Granted     Permission granted to share info w AGENCY: [facility]        Emotional Assessment Appearance:: Appears stated age Attitude/Demeanor/Rapport: Engaged Affect (typically observed): Appropriate Orientation: : Oriented to Self, Oriented to Place, Oriented to  Time, Oriented to Situation Alcohol / Substance Use: Not Applicable Psych Involvement: No (comment)  Admission diagnosis:  Visual loss [H54.7] Stroke-like symptom [R29.90] Cerebrovascular accident (CVA), unspecified mechanism [I63.9] Patient Active Problem List   Diagnosis Date Noted   Stroke-like symptom 09/18/2022   Diabetic ulcer of lower leg 09/18/2022   Constipation 09/18/2022   Shortness of breath 09/07/2022   Symptomatic anemia 09/05/2022   Chronic hypoxic respiratory failure 09/05/2022  Respiratory failure 08/22/2022   Pressure injury of skin 08/22/2022   Influenza A 08/22/2022   Sepsis  08/22/2022   Pneumonia 05/01/2022   Right upper lobe pneumonia 09/05/2021   Hyponatremia 09/05/2021   Anxiety 09/05/2021   BPH (benign prostatic hyperplasia) 09/05/2021   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   Asymptomatic bacteriuria 06/30/2021   Normocytic anemia 06/29/2021   Pain of amputation stump of right lower extremity 03/15/2021   Melanoma of cheek 10/15/2019   Acute hypoxic respiratory failure 07/11/2019   Acute respiratory disease due to COVID-19 virus 07/11/2019   Wound infection 06/22/2019   Abscess 06/22/2019   CKD (chronic kidney disease), stage III 05/23/2019   Tobacco abuse 05/23/2019   Cellulitis 05/23/2019   S/P AKA (above knee amputation) unilateral, right 12/05/2018   Amputation stump infection    Lymphedema of left leg    Diabetic polyneuropathy associated with type 2 diabetes mellitus    Above knee amputation of right lower extremity 10/22/2018   Dehiscence of amputation stump    Chronic infection of amputation stump 08/12/2018   Candidiasis 08/12/2018   Necrosis of toe 07/08/2018   Amputated toe of left foot 06/18/2018   Streptococcal infection    Pressure injury, stage 3 06/06/2018   Cutaneous abscess of left foot    Moderate protein-calorie malnutrition    Cellulitis of left lower extremity    Septic arthritis 06/03/2018   Idiopathic chronic venous hypertension of left lower extremity with ulcer and inflammation 11/14/2017   Great toe amputation status, left 11/14/2017   Enlarged lymph node in neck 07/18/2017   Malignant melanoma of face 04/16/2017   Osteomyelitis 03/25/2017   Skin cancer 03/25/2017   Depression 11/05/2016   Diabetic ulcer of toe of left foot 10/02/2016   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   Chronic pain 09/27/2016   DM2 (diabetes mellitus, type 2) 06/06/2016   GERD without esophagitis 05/11/2016   Hypertensive heart disease with congestive heart failure 12/15/2015   Spinal stenosis in cervical region 12/15/2015   Type II diabetes  mellitus with neurological manifestations 12/15/2015   Chronic diastolic HF (heart failure)    Pulmonary hypertension ([place])    HTN (hypertension)    HLD (hyperlipidemia)    Urinary retention    Diskitis    Foot drop, bilateral    Sepsis with acute renal failure without septic shock    COPD (chronic obstructive pulmonary disease) 10/03/2015   Acute osteomyelitis of left foot 10/03/2015   MRSA bacteremia 08/13/2015   Chronic low back pain 08/13/2015   Chronic multifocal osteomyelitis, multiple sites 08/13/2015   Community acquired pneumonia 08/13/2015   PCP:  [name], MD Pharmacy:   [place] [place] - [address] [place] [address] Phone: [phone] Fax: [phone]     Social Determinants of Health (SDOH) Social History: SDOH Screenings   Food Insecurity: No Food Insecurity (09/18/2022)  Housing: Low Risk  (09/18/2022)  Transportation Needs: No Transportation Needs (09/18/2022)  Utilities: Not At Risk (09/18/2022)  Depression (PHQ2-9): Low Risk  (03/08/2021)  Tobacco Use: High Risk (09/18/2022)   SDOH Interventions:     Readmission Risk Interventions    09/06/2022    1:48 PM 08/27/2022   12:46 PM 05/03/2022   11:53 AM  Readmission Risk Prevention Plan  Transportation Screening Complete Complete Complete  Medication Review Press photographer) Complete Complete Complete  PCP or Specialist appointment within 3-5 days of discharge Complete Complete Complete  HRI or Home Care  Consult Complete Complete Complete  SW Recovery Care/Counseling Consult Complete Complete Complete  Palliative Care Screening Not Applicable Not Applicable Not Applicable  Skilled Nursing Facility Complete Complete Complete

## 2022-09-19 NOTE — Consult Note (Addendum)
[place] Nurse Consult Note: Reason for Consult: Consult requested for left leg.  Pt is familiar to [facility] team from recent admission on 3/21.  Left leg and foot with generalized edema and dry cracked peeling skin to leg and foot. Left posterior thigh wound is pink dry scar tissue, no open wound. Left knee with dry scab which removes easily, revealing red dry partial thickness wound, 1X1X.1cm Left anterior ankle with chronic full thickness wound; 80% red, 20% yellow, 2X5X.2cm, small amt yellow drainage, dry cracked callous raised edges surrounding open wound. Dressing procedure/placement/frequency: Topical treatment orders provided for bedside nurses to perform as follows to promote healing: 1. Apply Bactroban and gauze to left anterior ankle wound Q day, then cover with foam dressing.  (Change foam dressing Q 3 days or PRN soiling.) 2. Band-Aid to left anterior knee, change Q day. Please re-consult if further assistance is needed.  Thank-you,  Julien Girt MSN, [place], [place], [place], [place]

## 2022-09-19 NOTE — Progress Notes (Signed)
Initial Nutrition Assessment  DOCUMENTATION CODES:   Obesity unspecified  INTERVENTION:  Continue carb modified diet Prosource Plus BID to provide 100kcal and 15g of protein 1 packet Juven BID, each packet provides 95 calories, 2.5 grams of protein (collagen), and 9.8 grams of carbohydrate (3 grams sugar); also contains 7 grams of L-arginine and L-glutamine, 300 mg vitamin C, 15 mg vitamin E, 1.2 mcg vitamin B-12, 9.5 mg zinc, 200 mg calcium, and 1.5 g  Calcium Beta-hydroxy-Beta-methylbutyrate to support wound healing MVI with minerals daily  NUTRITION DIAGNOSIS:   Increased nutrient needs related to wound healing as evidenced by estimated needs.  GOAL:   Patient will meet greater than or equal to 90% of their needs  MONITOR:   PO intake, Supplement acceptance, Labs, I & O's, Skin  REASON FOR ASSESSMENT:   Consult Wound healing  ASSESSMENT:   Pt with hx of DM type 2 s/p right AKA, COPD, HTN, HLD, CKD3, CHF, and tobacco use presented to ED from his facility with loss of vision.  Pt sleeping at the time of assessment. Did not open his eyes despite having his name called. Breakfast tray at bedside untouched.  RN reports pt intermittently refusing care. Reviewed chart and pt has been followed by nutrition team during numerous previous admissions. Pt noted to not like ensure supplements but has tolerated Prosource. Will also add juven to promote wound healing.  Weight hx inconsistent but does not appear to have lost weight recently.   Nutritionally Relevant Medications: Scheduled Meds:  atorvastatin  80 mg Oral Daily   docusate sodium  100 mg Oral BID   fenofibrate  54 mg Oral Daily   insulin aspart  0-9 Units Subcutaneous Q4H   insulin glargine-yfgn  10 Units Subcutaneous QHS   metroNIDAZOLE  500 mg Oral Q12H   senna  1 tablet Oral QHS   Continuous Infusions:  sodium chloride 75 mL/hr at 09/19/22 0058   cefTRIAXone (ROCEPHIN)  IV Stopped (09/18/22 2133)   vancomycin      PRN Meds: lactulose, polyethylene glycol, senna-docusate  Labs Reviewed: Na 134 BUN 25, creatinine 2.1 CBG ranges from 141-203 mg/dL over the last 24 hours  NUTRITION - FOCUSED PHYSICAL EXAM: Defer to in-person assessment  Diet Order:   Diet Order             Diet Carb Modified Fluid consistency: Thin; Room service appropriate? Yes  Diet effective now                   EDUCATION NEEDS:   Not appropriate for education at this time  Skin:  Skin Assessment: Reviewed RN Assessment Stage 2: left thigh, left ankle  Last BM:  prior to admission  Height:   Ht Readings from Last 1 Encounters:  09/18/22 5\' 7"  (1.702 m)    Weight:   Wt Readings from Last 1 Encounters:  09/18/22 112.1 kg    Ideal Body Weight:  60.5 kg (adjusted by 10% for AKA)  Adjusted BMI:  Body mass index is 40.3 kg/m. (Adjusted by 10% for AKA)  Estimated Nutritional Needs:  Kcal:  2100-2400 kcal/d Protein:  105-120 g/d Fluid:  2.2L/d   Ranell Patrick, RD, LDN Clinical Dietitian RD pager # available in [place]  After hours/weekend pager # available in [facility]

## 2022-09-19 NOTE — Hospital Course (Signed)
62 y.o. male with medical history significant of symptomatic anemia, chronic diastolic CHF, hypertension, DM 2, CKD stage IIIb, chronic hypoxic respiratory failure, COPD chronic hypoxic respiratory failure     Presented with loss of vision Patient presents from [facility] patient states that he cannot see his vision chronic back he was riding his motorized wheelchair when EMS arrived has history of prior visual loss secondary to a blood clot on patient states he has been having intermittent visual loss for the past few weeks but worse today Also endorses nausea foul-smelling urine states that he can only now see shapes no vomiting but does endorse abdominal discomfort which is diffuse otherwise no other localized neurological deficits no eye pain

## 2022-09-19 NOTE — Evaluation (Signed)
Occupational Therapy Evaluation Patient Details Name: Gregory [name] MRN: [id number] DOB: Apr 10, 1961 Today's Date: 09/19/2022   History of Present Illness Pt is a 62 y.o. male who presented 09/18/22 from [facility] with vision loss. CT in the ED revealed a new hypodensity in the right occipital region concerning for subacute vs chronic infarct. PMH: CHF, HTN, HLD, COPD, osteomyelitis, RT AKA, chronic L leg wounds, L toe amputations, CKD3, DM2, anemia, cancer, bil foot drop, HSV dermatitis, malignant melanoma, neurogenic bladder, idiopathic peripheral autonomic neuropathy, anterior cervical corpectomy   Clinical Impression   Prior to this admission, patient living at a SNF, and when questioned on his prior level of function, became upset and anxious. With increased coaxing, patient able to participate in PT/OT eval. Currently, patient presenting with R leg pain, anxiety, decreased activity tolerance, and visual field cut or deficit in L eye. Patient's L eye does not align with right, will track minimally, but with difficulty. When visual perception tested on LEFT eye patient could not see gloved finger from any angle, even when placed directly in front of LEFT eye. Patient mod A of 2 to complete ADLs, max A of 2 to come to EOB, but then placing himself back in bed at min guard level, politely refusing repositioning. OT will continue to follow, and patient would benefit from continued OT services at discharge.      Recommendations for follow up therapy are one component of a multi-disciplinary discharge planning process, led by the attending physician.  Recommendations may be updated based on patient status, additional functional criteria and insurance authorization.   Assistance Recommended at Discharge Frequent or constant Supervision/Assistance  Patient can return home with the following A lot of help with walking and/or transfers;A lot of help with bathing/dressing/bathroom;Direct supervision/assist  for medications management;Assist for transportation;Direct supervision/assist for financial management;Help with stairs or ramp for entrance;Assistance with cooking/housework    Functional Status Assessment  Patient has had a recent decline in their functional status and demonstrates the ability to make significant improvements in function in a reasonable and predictable amount of time.  Equipment Recommendations  Other (comment) (Defer to next venue)    Recommendations for Other Services Other (comment) (Palliative)     Precautions / Restrictions Precautions Precautions: Fall Restrictions Weight Bearing Restrictions: No      Mobility Bed Mobility Overal bed mobility: Needs Assistance Bed Mobility: Supine to Sit, Sit to Supine     Supine to sit: Max assist, +2 for physical assistance, +2 for safety/equipment Sit to supine: Min guard   General bed mobility comments: Patient requiring max A of 2 to come to the EOB due to lethagy and anxiety, then min gaurd to return to bed, requesting OT and PT not reposition him in the bed    Transfers Overall transfer level: Needs assistance                 General transfer comment: patient tearfully declining      Balance Overall balance assessment: Needs assistance Sitting-balance support: Feet supported, Bilateral upper extremity supported Sitting balance-Leahy Scale: Fair                                     ADL either performed or assessed with clinical judgement   ADL Overall ADL's : Needs assistance/impaired Eating/Feeding: Set up;Sitting   Grooming: Set up;Sitting   Upper Body Bathing: Minimal assistance;Sitting   Lower Body Bathing: Moderate  assistance;Maximal assistance;Sitting/lateral leans   Upper Body Dressing : Minimal assistance;Sitting   Lower Body Dressing: Maximal assistance;Total assistance;Sitting/lateral leans   Toilet Transfer: Maximal assistance;+2 for physical assistance;+2 for  safety/equipment;Squat-pivot Toilet Transfer Details (indicate cue type and reason): did not attempt in session         Functional mobility during ADLs: Moderate assistance;+2 for physical assistance;+2 for safety/equipment;Cueing for safety;Cueing for sequencing General ADL Comments: Patient presenting with R leg pain, anxiety, decreased activity tolerance, and visual field cut or deficit in L eye.     Vision Baseline Vision/History: 0 No visual deficits Ability to See in Adequate Light: 3 Highly impaired (in LEFT eye) Patient Visual Report: Other (comment) (Reporting difficulty seeing out of L eye) Vision Assessment?: Yes Eye Alignment: Impaired (comment) Ocular Range of Motion: Other (comment) (Left eye does not align with right, will track minimally, but with difficulty) Alignment/Gaze Preference: Head tilt;Gaze left Tracking/Visual Pursuits: Decreased smoothness of horizontal tracking;Decreased smoothness of vertical tracking;Requires cues, head turns, or add eye shifts to track Convergence: Impaired - to be further tested in functional context Visual Fields: Impaired-to be further tested in functional context;Left visual field deficit Additional Comments: When visual perception tested on LEFT patient cannot see gloved finger from any angle, even when placed directly in front of LEFT eye     Perception     Praxis      Pertinent Vitals/Pain Pain Assessment Pain Assessment: Faces Faces Pain Scale: Hurts even more Pain Location: R leg Pain Descriptors / Indicators: Discomfort, Guarding, Grimacing Pain Intervention(s): Limited activity within patient's tolerance, Monitored during session, Repositioned, Patient requesting pain meds-RN notified     Hand Dominance Right   Extremity/Trunk Assessment Upper Extremity Assessment Upper Extremity Assessment: Generalized weakness   Lower Extremity Assessment Lower Extremity Assessment: Defer to PT evaluation   Cervical / Trunk  Assessment Cervical / Trunk Assessment: Kyphotic (minimally)   Communication Communication Communication: No difficulties   Cognition Arousal/Alertness: Lethargic Behavior During Therapy: Anxious, Flat affect Overall Cognitive Status: Difficult to assess                                 General Comments: Patient lethargic initially and anxious and upset upon OT/PT eval attempt. Patient frequently keeping eyes closed, and when attempting to ask direct questions, patient would become upset requiring increased consoling and time.     General Comments       Exercises     Shoulder Instructions      Home Living Family/patient expects to be discharged to:: Skilled nursing facility                                        Prior Functioning/Environment Prior Level of Function : Needs assist             Mobility Comments: patient was "scooting" to his motorized wheelchair but has not been completing lately per patient ADLs Comments: patient requiring assist for showering and ADLs, when asked how much assist patient became anxious and upset        OT Problem List: Decreased strength;Decreased range of motion;Decreased activity tolerance;Impaired balance (sitting and/or standing);Impaired vision/perception;Decreased coordination;Decreased cognition;Decreased safety awareness;Decreased knowledge of precautions;Obesity;Pain;Increased edema      OT Treatment/Interventions: Self-care/ADL training;Therapeutic exercise;Energy conservation;DME and/or AE instruction;Manual therapy;Therapeutic activities;Cognitive remediation/compensation;Visual/perceptual remediation/compensation;Patient/family education;Balance training    OT Goals(Current goals can  be found in the care plan section) Acute Rehab OT Goals Patient Stated Goal: to go to bed OT Goal Formulation: With patient Time For Goal Achievement: 10/03/22 Potential to Achieve Goals: Poor  OT Frequency: Min  2X/week    Co-evaluation              AM-PAC OT "6 Clicks" Daily Activity     Outcome Measure Help from another person eating meals?: None Help from another person taking care of personal grooming?: None Help from another person toileting, which includes using toliet, bedpan, or urinal?: A Lot Help from another person bathing (including washing, rinsing, drying)?: A Lot Help from another person to put on and taking off regular upper body clothing?: A Little Help from another person to put on and taking off regular lower body clothing?: A Lot 6 Click Score: 17   End of Session Nurse Communication: Mobility status;Patient requests pain meds  Activity Tolerance: Patient limited by fatigue;Patient limited by lethargy;Patient limited by pain Patient left: in bed;with call bell/phone within reach;with bed alarm set  OT Visit Diagnosis: Unsteadiness on feet (R26.81);Other abnormalities of gait and mobility (R26.89);Muscle weakness (generalized) (M62.81);Other symptoms and signs involving cognitive function;Pain;Adult, failure to thrive (R62.7) Pain - Right/Left: Right Pain - part of body: Leg                Time: 1006-1020 OT Time Calculation (min): 14 min Charges:  OT General Charges $OT Visit: 1 Visit OT Evaluation $OT Eval Moderate Complexity: 1 Mod  Corinne Ports E. Aerika Groll, OTR/L Acute Rehabilitation Services [phone]   Ascencion Dike 09/19/2022, 11:26 AM

## 2022-09-19 NOTE — Progress Notes (Signed)
Progress Note   Patient: Gregory [name] [name] DOB: 1961-04-12 DOA: 09/18/2022     1 DOS: the patient was seen and examined on 09/19/2022   Brief hospital course: 62 y.o. male with medical history significant of symptomatic anemia, chronic diastolic CHF, hypertension, DM 2, CKD stage IIIb, chronic hypoxic respiratory failure, COPD chronic hypoxic respiratory failure     Presented with loss of vision Patient presents from [facility] patient states that he cannot see his vision chronic back he was riding his motorized wheelchair when EMS arrived has history of prior visual loss secondary to a blood clot on patient states he has been having intermittent visual loss for the past few weeks but worse today Also endorses nausea foul-smelling urine states that he can only now see shapes no vomiting but does endorse abdominal discomfort which is diffuse otherwise no other localized neurological deficits no eye pain  Assessment and Plan: Stroke, ruled in Ct showing New hypodensities within the right occipital cortex and subcortical white matter, with an appearance most consistent with subacute to chronic ischemic changes. -MRI was ordered, however pt has refused thus far -Carotid Doppler reviewed. No significant stenosis -Echo reviewed. Normal LVEF no atrial level shunts noted -Cont antiplatelet ASA 81  statin change to lipitor 80 mg po q day -Allowing permissive Hypertension keep BP <220/120, gradually normalize in 5-7 days    COPD (chronic obstructive pulmonary disease) Chronic stable continue home meds, O2 as needed    Chronic diastolic HF (heart failure) Lasix was on hold for some dehydration   HTN (hypertension) Allow permissive htn   HLD (hyperlipidemia) Now on atorvastatin 80mg  daily   Type II diabetes mellitus with neurological manifestations  - Order Sensitive  SSI   - continue home insulin Lantus but decreased to  10 units,    GERD without esophagitis Continue protonix  40 mg po bid   Diabetic ulcer of lower leg Noted foul discharge from diabetic foot ulcer of the ankle.  Plain images reviewed, no obvious evidence of osteomyelitis Pt has refused MRI this AM CRP was elevated -continued on ceftriaxone metronidazole and Vanco -Consulted Orthopedic Surgery, appreciate input by [name]. Recommendation for compression wraps and evaluate Kerecis tissue graft in office. Pt was not agreeable to compression. 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 Repeats wanting ginger ale  Physical Exam: Vitals:   09/18/22 2330 09/19/22 0445 09/19/22 0800 09/19/22 1252  BP:  (!) 168/79 (!) 157/87 (!) 174/93  Pulse:  100 76 83  Resp:  20 20 20   Temp:  98 F (36.7 C) 98.2 F (36.8 C) 97.7 F (36.5 C)  TempSrc:  Oral Oral Oral  SpO2:  93% 93% 92%  Weight: 112.1 kg     Height: 5\' 7"  (1.702 m)  General exam: Awake, laying in bed, in nad Respiratory system: Normal respiratory effort, no wheezing Cardiovascular system: regular rate, s1, s2 Gastrointestinal system: Soft, nondistended, positive BS Central nervous system: CN2-12 grossly intact, strength intact Extremities: Perfused, no clubbing Skin: Normal skin turgor, no notable skin lesions seen Psychiatry: Mood normal // no  visual hallucinations   Data Reviewed:  Labs reviewed: Na 134, K 4.6, Cr 2.1, hgb 10.5  Family Communication: Pt in room, family not at bedside  Disposition: Status is: Inpatient Remains inpatient appropriate because: Severity of illness  Planned Discharge Destination: Skilled nursing facility   Author: Marylu Lund, MD 09/19/2022 3:45 PM  For on call review [email or web address].

## 2022-09-19 NOTE — Progress Notes (Addendum)
Patient refused EKG. Provider notified.

## 2022-09-19 NOTE — Progress Notes (Signed)
Patient refused Tele, Dr. Bridgett Larsson aware.

## 2022-09-19 NOTE — Progress Notes (Signed)
Talked over the phone with patient's daughter, [name], and provided her updates.

## 2022-09-19 NOTE — Progress Notes (Signed)
MRSA nasal swab collected and sent to lab for further testing. Patient placed on contact precautions.

## 2022-09-19 NOTE — NC FL2 (Signed)
[place] LEVEL OF CARE FORM     IDENTIFICATION  Patient Name: Gregory [name] Birthdate: 1961-02-03 Sex: male Admission Date (Current Location): 09/18/2022  [facility] [place] Number:  Herbalist and Address:  [facility] [place], [place] [address], [place], [place] [address]      Provider Number: [id number]  Attending Physician Name and Address:  Donne Hazel, MD  Relative Name and Phone Number:       Current Level of Care: Hospital Recommended Level of Care: [place] Prior Approval Number:    Date Approved/Denied:   PASRR Number:    Discharge Plan: SNF    Current Diagnoses: Patient Active Problem List   Diagnosis Date Noted   Stroke-like symptom 09/18/2022   Diabetic ulcer of lower leg 09/18/2022   Constipation 09/18/2022   Shortness of breath 09/07/2022   Symptomatic anemia 09/05/2022   Chronic hypoxic respiratory failure 09/05/2022   Respiratory failure 08/22/2022   Pressure injury of skin 08/22/2022   Influenza A 08/22/2022   Sepsis 08/22/2022   Pneumonia 05/01/2022   Right upper lobe pneumonia 09/05/2021   Hyponatremia 09/05/2021   Anxiety 09/05/2021   BPH (benign prostatic hyperplasia) 09/05/2021   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   Asymptomatic bacteriuria 06/30/2021   Normocytic anemia 06/29/2021   Pain of amputation stump of right lower extremity 03/15/2021   Melanoma of cheek 10/15/2019   Acute hypoxic respiratory failure 07/11/2019   Acute respiratory disease due to COVID-19 virus 07/11/2019   Wound infection 06/22/2019   Abscess 06/22/2019   CKD (chronic kidney disease), stage III 05/23/2019   Tobacco abuse 05/23/2019   Cellulitis 05/23/2019   S/P AKA (above knee amputation) unilateral, right 12/05/2018   Amputation stump infection    Lymphedema of left leg    Diabetic polyneuropathy associated with type 2 diabetes mellitus    Above knee amputation of right lower extremity 10/22/2018    Dehiscence of amputation stump    Chronic infection of amputation stump 08/12/2018   Candidiasis 08/12/2018   Necrosis of toe 07/08/2018   Amputated toe of left foot 06/18/2018   Streptococcal infection    Pressure injury, stage 3 06/06/2018   Cutaneous abscess of left foot    Moderate protein-calorie malnutrition    Cellulitis of left lower extremity    Septic arthritis 06/03/2018   Idiopathic chronic venous hypertension of left lower extremity with ulcer and inflammation 11/14/2017   Great toe amputation status, left 11/14/2017   Enlarged lymph node in neck 07/18/2017   Malignant melanoma of face 04/16/2017   Osteomyelitis 03/25/2017   Skin cancer 03/25/2017   Depression 11/05/2016   Diabetic ulcer of toe of left foot 10/02/2016   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   Chronic pain 09/27/2016   DM2 (diabetes mellitus, type 2) 06/06/2016   GERD without esophagitis 05/11/2016   Hypertensive heart disease with congestive heart failure 12/15/2015   Spinal stenosis in cervical region 12/15/2015   Type II diabetes mellitus with neurological manifestations 12/15/2015   Chronic diastolic HF (heart failure)    Pulmonary hypertension (HCC)    HTN (hypertension)    HLD (hyperlipidemia)    Urinary retention    Diskitis    Foot drop, bilateral    Sepsis with acute renal failure without septic shock    COPD (chronic obstructive pulmonary disease) 10/03/2015   Acute osteomyelitis of left foot 10/03/2015   MRSA bacteremia 08/13/2015   Chronic low back pain 08/13/2015   Chronic multifocal  osteomyelitis, multiple sites 08/13/2015   Community acquired pneumonia 08/13/2015    Orientation RESPIRATION BLADDER Height & Weight     Self, Time, Situation, Place  Normal Incontinent Weight: 247 lb 2.2 oz (112.1 kg) Height:  5\' 7"  (170.2 cm)  BEHAVIORAL SYMPTOMS/MOOD NEUROLOGICAL BOWEL NUTRITION STATUS      Incontinent Diet (carb modified)  AMBULATORY STATUS COMMUNICATION OF NEEDS Skin   Extensive  Assist Verbally PU Stage and Appropriate Care   PU Stage 2 Dressing:  (left thigh: foam dressing, lift every shift to assess and change PRN; left ankle, no dressing)                   Personal Care Assistance Level of Assistance  Bathing, Feeding, Dressing Bathing Assistance: Maximum assistance Feeding assistance: Limited assistance Dressing Assistance: Maximum assistance     Functional Limitations Info  Sight, Hearing Sight Info: Impaired Hearing Info: Impaired      SPECIAL CARE FACTORS FREQUENCY                       Contractures Contractures Info: Not present    Additional Factors Info  Code Status, Allergies, Isolation Precautions Code Status Info: Full Allergies Info: Latex     Isolation Precautions Info: OMDRO, MRSA     Current Medications (09/19/2022):  This is the current hospital active medication list Current Facility-Administered Medications  Medication Dose Route Frequency Provider Last Rate Last Admin    stroke: early stages of recovery book   Does not apply Once Toy Baker, MD       0.9 %  sodium chloride infusion   Intravenous Continuous Doutova, Anastassia, MD 75 mL/hr at 09/19/22 0058 Restarted at 09/19/22 0058   acetaminophen (TYLENOL) tablet 650 mg  650 mg Oral Q4H PRN Toy Baker, MD   650 mg at 09/19/22 [id number]   Or   acetaminophen (TYLENOL) 160 MG/5ML solution 650 mg  650 mg Per Tube Q4H PRN Toy Baker, MD       Or   acetaminophen (TYLENOL) suppository 650 mg  650 mg Rectal Q4H PRN Toy Baker, MD       aspirin EC tablet 81 mg  81 mg Oral Daily Doutova, Anastassia, MD       atorvastatin (LIPITOR) tablet 80 mg  80 mg Oral Daily Doutova, Anastassia, MD   80 mg at 09/18/22 2052   cefTRIAXone (ROCEPHIN) 2 g in sodium chloride 0.9 % 100 mL IVPB  2 g Intravenous Q24H Toy Baker, MD   Stopped at 09/18/22 2133   docusate sodium (COLACE) capsule 100 mg  100 mg Oral BID Toy Baker, MD   100 mg at  09/18/22 2259   fenofibrate tablet 54 mg  54 mg Oral Daily Doutova, Anastassia, MD       fluticasone (FLONASE) 50 MCG/ACT nasal spray 1 spray  1 spray Each Nare q AM Doutova, Anastassia, MD       HYDROcodone-acetaminophen (NORCO/VICODIN) 5-325 MG per tablet 1 tablet  1 tablet Oral Q6H PRN Toy Baker, MD   1 tablet at 09/19/22 0015   insulin aspart (novoLOG) injection 0-9 Units  0-9 Units Subcutaneous Q4H Doutova, Anastassia, MD   1 Units at 09/19/22 0400   insulin glargine-yfgn (SEMGLEE) injection 10 Units  10 Units Subcutaneous QHS Doutova, Anastassia, MD       ipratropium-albuterol (DUONEB) 0.5-2.5 (3) MG/3ML nebulizer solution 3 mL  3 mL Nebulization Q6H PRN Doutova, Anastassia, MD       lactulose (Rentz) 10  GM/15ML solution 10 g  10 g Oral Q8H PRN Toy Baker, MD       LORazepam (ATIVAN) tablet 0.5 mg  0.5 mg Oral Q6H PRN Roel Cluck, Anastassia, MD   0.5 mg at 09/19/22 0513   melatonin tablet 3 mg  3 mg Oral QHS Doutova, Anastassia, MD   3 mg at 09/18/22 2259   metroNIDAZOLE (FLAGYL) tablet 500 mg  500 mg Oral Q12H Donnamae Jude, RPH       nicotine (NICODERM CQ - dosed in mg/24 hours) patch 21 mg  21 mg Transdermal Daily Doutova, Anastassia, MD   21 mg at 09/18/22 2053   oxymetazoline (AFRIN) 0.05 % nasal spray 1 spray  1 spray Each Nare Q12H PRN Doutova, Anastassia, MD       polyethylene glycol (MIRALAX / GLYCOLAX) packet 17 g  17 g Oral Daily PRN Doutova, Anastassia, MD       senna (SENOKOT) tablet 8.6 mg  1 tablet Oral QHS Doutova, Anastassia, MD   8.6 mg at 09/18/22 2259   senna-docusate (Senokot-S) tablet 1 tablet  1 tablet Oral QHS PRN Toy Baker, MD       tamsulosin (FLOMAX) capsule 0.8 mg  0.8 mg Oral Daily Doutova, Anastassia, MD   0.8 mg at 09/18/22 2258   traMADol (ULTRAM) tablet 100 mg  100 mg Oral Q6H PRN Toy Baker, MD   100 mg at 09/19/22 0404   vancomycin (VANCOCIN) IVPB 1000 mg/200 mL premix  1,000 mg Intravenous Q24H Donnamae Jude, [facility]          Discharge Medications: Please see discharge summary for a list of discharge medications.  Relevant Imaging Results:  Relevant Lab Results:   Additional Information SS#: [id number]  Geralynn Ochs, LCSW

## 2022-09-19 NOTE — Consult Note (Signed)
ORTHOPAEDIC CONSULTATION  REQUESTING PHYSICIAN: Donne Hazel, MD  Chief Complaint: Ulceration dorsum left ankle.  HPI: Gregory [name] is a 62 y.o. male who presents with chronic and venous insufficiency of the left lower extremity with type 2 diabetes.  Patient is status post a right above-the-knee amputation.  Patient had a skin biopsy over the dorsum of the left ankle for skin cancer.  Patient has had a chronic nonhealing wound since that time.  Past Medical History:  Diagnosis Date   Acquired absence of left great toe    Acquired absence of right leg below knee    Acute osteomyelitis of calcaneum, right 10/02/2016   Acute respiratory failure    Amputation stump infection 08/12/2018   Anemia    Basal cell carcinoma, eyelid    Cancer    Candidiasis 08/12/2018   CHF (congestive heart failure)    chronic diastolic    Chronic back pain    Chronic kidney disease    stage 3    Chronic multifocal osteomyelitis    of ankle and foot    Chronic pain syndrome    COPD (chronic obstructive pulmonary disease)    DDD (degenerative disc disease), lumbar    Depression    major depressive disorder    Diabetes mellitus without complication    type 2    Diabetic ulcer of toe of left foot 10/02/2016   Difficult intubation    Difficulty in walking, not elsewhere classified    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   Foot drop, bilateral    Foot drop, right 12/27/2015   GERD (gastroesophageal reflux disease)    Herpesviral vesicular dermatitis    History of COVID-19 03/15/2022   Hyperlipidemia    Hyperlipidemia    Hypertension    Insomnia    Malignant melanoma of other parts of face    Melanoma    MRSA bacteremia    Muscle weakness (generalized)    Nasal congestion    Necrosis of toe 07/08/2018   Neuromuscular dysfunction of bladder    Osteomyelitis    Osteomyelitis    Osteomyelitis    right BKA   Other idiopathic peripheral autonomic neuropathy    Retention of urine, unspecified     Squamous cell carcinoma of skin    Unsteadiness on feet    Urinary retention    Urinary retention    Urinary tract infection    Wears dentures    Wears glasses    Past Surgical History:  Procedure Laterality Date   AMPUTATION Left 03/29/2017   Procedure: LEFT GREAT TOE AMPUTATION AT METATARSOPHALANGEAL JOINT;  Surgeon: [name], MD;  Location: [place];  Service: Orthopedics;  Laterality: Left;   AMPUTATION Right 03/29/2017   Procedure: RIGHT BELOW KNEE AMPUTATION;  Surgeon: [name], MD;  Location: [place];  Service: Orthopedics;  Laterality: Right;   AMPUTATION Left 06/06/2018   Procedure: LEFT 2ND TOE AMPUTATION;  Surgeon: [name], MD;  Location: [place];  Service: Orthopedics;  Laterality: Left;   AMPUTATION Right 11/19/2018   Procedure: AMPUTATION ABOVE KNEE;  Surgeon: [name], MD;  Location: [place];  Service: Orthopedics;  Laterality: Right;   ANTERIOR CERVICAL CORPECTOMY N/A 11/25/2015   Procedure: ANTERIOR CERVICAL FIVE CORPECTOMY Cervical four - six fusion;  Surgeon: Consuella Lose, MD;  Location: [place] [facility];  Service: Neurosurgery;  Laterality: N/A;  ANTERIOR CERVICAL FIVE CORPECTOMY Cervical four - six fusion   APPLICATION OF WOUND VAC Right 10/22/2018  Procedure: Application Of Wound Vac;  Surgeon: [name], MD;  Location: [place];  Service: Orthopedics;  Laterality: Right;   CYSTOSCOPY WITH BIOPSY N/A 05/01/2022   Procedure: CYSTOSCOPY WITH URETHRAL BIOPSY;  Surgeon: Robley Fries, MD;  Location: [facility];  Service: Urology;  Laterality: N/A;  1 HR   CYSTOSCOPY WITH RETROGRADE URETHROGRAM N/A 04/25/2018   Procedure: CYSTOSCOPY WITH RETROGRADE URETHROGRAM/ BALLOON DILATION;  Surgeon: Kathie Rhodes, MD;  Location: [facility];  Service: Urology;  Laterality: N/A;   CYSTOSCOPY WITH URETHRAL DILATATION N/A 05/01/2022   Procedure: BALLOON DILATION WITH  OPTILUME;  Surgeon: Robley Fries, MD;  Location: [facility];  Service: Urology;  Laterality: N/A;    MULTIPLE TOOTH EXTRACTIONS     POSTERIOR CERVICAL FUSION/FORAMINOTOMY N/A 11/29/2015   Procedure: Cervical Three-Cervical Seven Posterior Cervical Laminectomy with Fusion;  Surgeon: Consuella Lose, MD;  Location: [place] [facility];  Service: Neurosurgery;  Laterality: N/A;  Cervical Three-Cervical Seven Posterior Cervical Laminectomy with Fusion   STUMP REVISION Right 10/22/2018   Procedure: REVISION RIGHT BELOW KNEE AMPUTATION;  Surgeon: [name], MD;  Location: [place];  Service: Orthopedics;  Laterality: Right;   STUMP REVISION Right 12/05/2018   Procedure: Revision Right Above Knee Amputation;  Surgeon: [name], MD;  Location: [place];  Service: Orthopedics;  Laterality: Right;   STUMP REVISION Right 05/29/2019   Procedure: REVISION RIGHT ABOVE KNEE AMPUTATION;  Surgeon: [name], MD;  Location: [place];  Service: Orthopedics;  Laterality: Right;   STUMP REVISION Right 06/24/2019   Procedure: STUMP REVISION;  Surgeon: [name], MD;  Location: [place];  Service: Orthopedics;  Laterality: Right;   Social History   Socioeconomic History   Marital status: Single    Spouse name: Not on file   Number of children: Not on file   Years of education: Not on file   Highest education level: Not on file  Occupational History   Not on file  Tobacco Use   Smoking status: Every Day    Packs/day: .5    Types: Cigarettes   Smokeless tobacco: Never   Tobacco comments:    11/19/18  Vaping Use   Vaping Use: Never used  Substance and Sexual Activity   Alcohol use: No   Drug use: No   Sexual activity: Not on file  Other Topics Concern   Not on file  Social History Narrative   Not on file   Social Determinants of Health   Financial Resource Strain: Not on file  Food Insecurity: No Food Insecurity (09/18/2022)   Hunger Vital Sign    Worried About Running Out of Food in the Last Year: Never true    Ran Out of Food in the Last Year: Never true  Transportation Needs: No Transportation  Needs (09/18/2022)   PRAPARE - Hydrologist (Medical): No    Lack of Transportation (Non-Medical): No  Physical Activity: Not on file  Stress: Not on file  Social Connections: Not on file   Family History  Problem Relation Age of Onset   Diabetes Mother    Heart disease Mother    Cancer Mother    Cancer Father    Bone cancer Father    Prostate cancer Father    Cancer Paternal Grandfather    Cancer Other    Diabetes Other    - negative except otherwise stated in the family history section Allergies  Allergen Reactions   Latex Other (See  Comments)    "ALLERGIC," per [facility]   Prior to Admission medications   Medication Sig Start Date End Date Taking? Authorizing Provider  Amino Acids-Protein Hydrolys (PRO-STAT) LIQD Take 30 mLs by mouth in the morning.    [provider]  cetirizine (ZYRTEC) 10 MG tablet Take 10 mg by mouth daily.    [provider]  cyanocobalamin 1000 MCG tablet Take 1,000 mcg by mouth daily.    [provider]  docusate sodium (COLACE) 100 MG capsule Take 1 capsule (100 mg total) by mouth 2 (two) times daily. 05/31/19   Amin, Jeanella Flattery, MD  fenofibrate (TRICOR) 48 MG tablet Take 48 mg by mouth daily.    [provider]  ferrous sulfate 325 (65 FE) MG tablet Take 1 tablet (325 mg total) by mouth daily with breakfast. 11/23/18   Matilde Haymaker, MD  fluticasone [facility]) 50 MCG/ACT nasal spray Place 1 spray into both nostrils in the morning.    [provider]  furosemide (LASIX) 40 MG tablet Take 1 tablet (40 mg total) by mouth daily. 09/07/22   Rai, Ripudeep K, MD  Glucagon, rDNA, (GLUCAGON EMERGENCY) 1 MG KIT Inject 1 mg into the vein as needed (for hypolycemia).    [provider]  insulin glargine (LANTUS SOLOSTAR) 100 UNIT/ML Solostar Pen Inject 15 Units into the skin daily. 09/07/22   Rai, Ripudeep K, MD  insulin lispro (HUMALOG) 100 UNIT/ML KwikPen Inject 0-10 Units into the skin See  admin instructions. Inject 0-10 units into the skin three times a day and at bedtime, PER SLIDING SCALE:  BGL 151-200 = 2 units 201-250 = 4 units 251-300 = 6 units 301-350 =  8 units 351-400 = 10 units Patient taking differently: Inject 3-20 Units into the skin See admin instructions. Inject 3-20 units into the skin before meals and at bedtime, PER SLIDING SCALE: BGL 121-150 = 3 units; 151-200 = 4 units; 201-250 = 7 units; 251-300 = 11 units; 301-350 = 15 units; 351-400 = 20 units; >400 = CALL MD 01/31/22   Terrilee Croak, MD  ipratropium-albuterol (DUONEB) 0.5-2.5 (3) MG/3ML SOLN Take 3 mLs by nebulization every 6 (six) hours as needed (for shortness of breath).    [provider]  lactulose (CHRONULAC) 10 GM/15ML solution Take 15 mLs (10 g total) by mouth 3 (three) times daily as needed for moderate constipation. Patient taking differently: Take 10 g by mouth every 8 (eight) hours as needed for moderate constipation. 01/31/22   Terrilee Croak, MD  leptospermum manuka honey (MEDIHONEY) PSTE paste Apply 1 Application topically daily. 09/08/22   Rai, Ripudeep Raliegh Ip, MD  Lidocaine 4 % PTCH Apply 1 patch topically See admin instructions. Apply 1 patch to both the right shoulder and the right stump at 9 AM daily- remove at 9 PM    [provider]  LORazepam (ATIVAN) 0.5 MG tablet Take 1 tablet (0.5 mg total) by mouth every 6 (six) hours as needed for anxiety. 09/07/22   Rai, Ripudeep K, MD  magnesium oxide (MAG-OX) 400 MG tablet Take 400 mg by mouth 2 (two) times daily.    [provider]  melatonin 3 MG TABS tablet Take 3 mg by mouth at bedtime.    [provider]  Menthol, Topical Analgesic, (BIOFREEZE) 4 % GEL Apply 1 Application topically See admin instructions. Apply to right leg/stump every 8 hours as needed for pain    [provider]  Meth-Hyo-M Bl-Na Phos-Ph Sal (URIBEL) 118 MG CAPS Take 1  capsule by mouth every 8 (eight) hours as needed (bladder pain).     [provider]  methenamine (MANDELAMINE) 1 g tablet Take 1 g by mouth daily.    [provider]  metoprolol tartrate (LOPRESSOR) 25 MG tablet Take 0.5 tablets (12.5 mg total) by mouth 2 (two) times daily. 07/18/19 09/06/22  Elodia Florence., MD  mirabegron ER (MYRBETRIQ) 50 MG TB24 tablet Take 50 mg by mouth daily.    [provider]  Multiple Vitamins-Minerals (MULTIVITAMIN WITH MINERALS) tablet Take 1 tablet by mouth daily.    [provider]  Multiple Vitamins-Minerals (PRESERVISION AREDS 2) CAPS Take 1 capsule by mouth in the morning and at bedtime.    [provider]  naloxone [facility]) 4 MG/0.1ML LIQD nasal spray kit Place 1 spray into the nose as needed (poorly responsive or turning blue).    [provider]  nystatin cream (MYCOSTATIN) Apply 1 Application topically See admin instructions. Apply to wound site every day shift and as needed, for wound care    [provider]  omeprazole (PRILOSEC) 20 MG capsule Take 20 mg by mouth daily.    [provider]  ondansetron (ZOFRAN) 4 MG tablet Take 4 mg by mouth every 8 (eight) hours as needed for nausea.    [provider]  oxybutynin (DITROPAN-XL) 10 MG 24 hr tablet Take 10 mg by mouth daily.    [provider]  Oxycodone HCl 20 MG TABS Take 1 tablet (20 mg total) by mouth every 8 (eight) hours as needed (severe pain). 09/07/22   Rai, Vernelle Emerald, MD  OXYGEN Inhale 2 L/min into the lungs as needed (for shortness of breath- to maintain sats >90%).    [provider]  oxymetazoline (AFRIN 12 HOUR) 0.05 % nasal spray Place 1 spray into both nostrils every 12 (twelve) hours as needed for congestion.    [provider]  Polyethyl Glycol-Propyl Glycol (LUBRICANT EYE DROPS) 0.4-0.3 % SOLN Place 2 drops into both eyes every 4 (four) hours as needed (dryness/itching).    [provider]  polyethylene glycol (MIRALAX / GLYCOLAX) 17 g packet  Take 17 g by mouth daily as needed for mild constipation.    [provider]  rosuvastatin (CRESTOR) 10 MG tablet Take 10 mg by mouth at bedtime.    [provider]  senna (SENOKOT) 8.6 MG TABS tablet Take 1 tablet by mouth at bedtime.    [provider]  SPIRIVA RESPIMAT 1.25 MCG/ACT AERS Inhale 1 each into the lungs in the morning.    [provider]  tamsulosin (FLOMAX) 0.4 MG CAPS capsule Take 0.8 mg by mouth daily. 11/26/13   [provider]  TRULICITY [id number] [id number] SOPN Inject 0.75 mg into the skin every Wednesday.    [provider]   VAS [place] LOWER EXTREMITY VENOUS (DVT)  Result Date: 09/19/2022  Lower Venous DVT Study Patient Name:  Gregory [name]  Date of Exam:   09/19/2022 Medical Rec #: [id number]     Accession #:    [id number] Date of Birth: [birth year]    Patient Gender: M Patient Age:   79 years Exam Location:  [facility] Procedure:      VAS [place] LOWER EXTREMITY VENOUS (DVT) Referring Phys: Nyoka Lint DOUTOVA --------------------------------------------------------------------------------  Indications: Edema.  Limitations: Poor ultrasound/tissue interface and patient positioning. Comparison Study: Previous 08/23/22 negative. Performing Technologist: McKayla Maag RVT, VT  Examination Guidelines: A complete evaluation includes B-mode imaging, spectral Doppler, color  Doppler, and power Doppler as needed of all accessible portions of each vessel. Bilateral testing is considered an integral part of a complete examination. Limited examinations for reoccurring indications may be performed as noted. The reflux portion of the exam is performed with the patient in reverse Trendelenburg.  +-----+---------------+---------+-----------+----------+--------------+ RIGHTCompressibilityPhasicitySpontaneityPropertiesThrombus Aging +-----+---------------+---------+-----------+----------+--------------+ CFV  Full           Yes      Yes                                  +-----+---------------+---------+-----------+----------+--------------+ SFJ  Full                                                        +-----+---------------+---------+-----------+----------+--------------+   +---------+---------------+---------+-----------+----------+-------------------+ LEFT     CompressibilityPhasicitySpontaneityPropertiesThrombus Aging      +---------+---------------+---------+-----------+----------+-------------------+ CFV      Full           Yes      Yes                                      +---------+---------------+---------+-----------+----------+-------------------+ SFJ      Full                                                             +---------+---------------+---------+-----------+----------+-------------------+ FV Prox  Full                                                             +---------+---------------+---------+-----------+----------+-------------------+ FV Mid   Full                                                             +---------+---------------+---------+-----------+----------+-------------------+ FV Distal                                             Not visualized due                                                        to patient  positioning         +---------+---------------+---------+-----------+----------+-------------------+ PFV      Full                                                             +---------+---------------+---------+-----------+----------+-------------------+ POP      Full           Yes      Yes                                      +---------+---------------+---------+-----------+----------+-------------------+ PTV      Full                                         Not well visualized +---------+---------------+---------+-----------+----------+-------------------+ PERO      Full                                         Not well visualized +---------+---------------+---------+-----------+----------+-------------------+    Summary: RIGHT: - No evidence of common femoral vein obstruction.  LEFT: - There is no evidence of deep vein thrombosis in the lower extremity. However, portions of this examination were limited- see technologist comments above.  - No cystic structure found in the popliteal fossa.  *See table(s) above for measurements and observations.    Preliminary    VAS US CAROTID (at [facility] and [facility] only)  Result Date: 09/19/2022 Carotid Arterial Duplex Study Patient Name:  Gregory [name]  Date of Exam:   09/19/2022 Medical Rec #: [id number]     Accession #:    [id number] Date of Birth: [birth year]    Patient Gender: M Patient Age:   18 years Exam Location:  [facility] Procedure:      VAS US CAROTID Referring Phys: Nyoka Lint DOUTOVA --------------------------------------------------------------------------------  Indications:       CVA and Visual disturbance. Risk Factors:      Hypertension, Diabetes. Limitations        Today's exam was limited due to the body habitus of the                    patient and patient positioning. Comparison Study:  No prior study. Performing Technologist: McKayla Maag RVT, VT  Examination Guidelines: A complete evaluation includes B-mode imaging, spectral Doppler, color Doppler, and power Doppler as needed of all accessible portions of each vessel. Bilateral testing is considered an integral part of a complete examination. Limited examinations for reoccurring indications may be performed as noted.  Right Carotid Findings: +----------+--------+--------+--------+--------------------------+--------+           PSV cm/sEDV cm/sStenosisPlaque Description        Comments +----------+--------+--------+--------+--------------------------+--------+ CCA Prox  55      2                                                   +----------+--------+--------+--------+--------------------------+--------+ CCA Distal62  11                                                 +----------+--------+--------+--------+--------------------------+--------+ ICA Prox  72      15      1-39%   heterogenous and irregular         +----------+--------+--------+--------+--------------------------+--------+ ICA Mid   90      14                                                 +----------+--------+--------+--------+--------------------------+--------+ ICA Distal65      13                                                 +----------+--------+--------+--------+--------------------------+--------+ ECA       103     9                                                  +----------+--------+--------+--------+--------------------------+--------+ +----------+--------+-------+----------------+-------------------+           PSV cm/sEDV cmsDescribe        Arm Pressure (mmHG) +----------+--------+-------+----------------+-------------------+ [id number]             Multiphasic, WNL                    +----------+--------+-------+----------------+-------------------+ +---------+--------+--+--------+-+---------+ VertebralPSV cm/s36EDV cm/s7Antegrade +---------+--------+--+--------+-+---------+  Left Carotid Findings: +----------+--------+--------+--------+------------------+--------+           PSV cm/sEDV cm/sStenosisPlaque DescriptionComments +----------+--------+--------+--------+------------------+--------+ CCA Prox  90      11                                         +----------+--------+--------+--------+------------------+--------+ CCA Distal89      6                                          +----------+--------+--------+--------+------------------+--------+ ICA Prox  53      8       1-39%                              +----------+--------+--------+--------+------------------+--------+ ICA  Mid   52      8                                          +----------+--------+--------+--------+------------------+--------+ ICA Distal31      5                                          +----------+--------+--------+--------+------------------+--------+ ECA  52      2                                          +----------+--------+--------+--------+------------------+--------+ +----------+--------+--------+-----------------------------+-------------------+           PSV cm/sEDV cm/sDescribe                     Arm Pressure (mmHG) +----------+--------+--------+-----------------------------+-------------------+ Subclavian                Not visualized due to patient                                              positioning.                                     +----------+--------+--------+-----------------------------+-------------------+ +---------+--------+--------+-----------------------------------------+ VertebralPSV cm/sEDV cm/sNot visualized due to patient positioning +---------+--------+--------+-----------------------------------------+   Summary: Right Carotid: Velocities in the right ICA are consistent with a 1-39% stenosis. Left Carotid: Velocities in the left ICA are consistent with a 1-39% stenosis. Vertebrals:  Right vertebral artery demonstrates antegrade flow. Subclavians: Normal flow hemodynamics were seen in the right subclavian artery. *See table(s) above for measurements and observations.     Preliminary    ECHOCARDIOGRAM COMPLETE  Result Date: 09/19/2022    ECHOCARDIOGRAM REPORT   Patient Name:   Gregory [name] Date of Exam: 09/19/2022 Medical Rec #:  [id number]    Height:       67.0 in Accession #:    [id number]   Weight:       247.1 lb Date of Birth:  [birth year]   BSA:          2.213 m Patient Age:    10 years     BP:           157/87 mmHg Patient Gender: M            HR:           77 bpm. Exam Location:  Inpatient Procedure: 2D Echo, Cardiac Doppler  and Color Doppler Indications:    Stroke  History:        Patient has prior history of Echocardiogram examinations, most                 recent 01/25/2022. COPD; Risk Factors:Hypertension and Diabetes.                 CKD, chronic hypoxic respiratory failure.  Sonographer:    Eartha Inch Referring Phys: [id number] ANASTASSIA DOUTOVA  Sonographer Comments: Technically difficult study due to poor echo windows. Image acquisition challenging due to patient body habitus and Image acquisition challenging due to respiratory motion. IMPRESSIONS  1. Left ventricular ejection fraction, by estimation, is 55 to 60%. The left ventricle has normal function. The left ventricle has no regional wall motion abnormalities. The left ventricular internal cavity size was mildly dilated. Left ventricular diastolic parameters are consistent with Grade I diastolic dysfunction (impaired relaxation).  2. Right ventricular systolic function is normal. The right ventricular size is normal. Tricuspid regurgitation signal is inadequate for assessing PA pressure.  3. Left atrial size was mildly dilated.  4. The mitral valve is normal in structure.  No evidence of mitral valve regurgitation.  5. The aortic valve is tricuspid. There is mild calcification of the aortic valve. Aortic valve regurgitation is mild to moderate. Aortic valve sclerosis/calcification is present, without any evidence of aortic stenosis.  6. The inferior vena cava is normal in size with greater than 50% respiratory variability, suggesting right atrial pressure of 3 mmHg. FINDINGS  Left Ventricle: Left ventricular ejection fraction, by estimation, is 55 to 60%. The left ventricle has normal function. The left ventricle has no regional wall motion abnormalities. The left ventricular internal cavity size was mildly dilated. There is  no left ventricular hypertrophy. Left ventricular diastolic parameters are consistent with Grade I diastolic dysfunction (impaired relaxation). Normal  left ventricular filling pressure. Right Ventricle: The right ventricular size is normal. No increase in right ventricular wall thickness. Right ventricular systolic function is normal. Tricuspid regurgitation signal is inadequate for assessing PA pressure. Left Atrium: Left atrial size was mildly dilated. Right Atrium: Right atrial size was normal in size. Pericardium: There is no evidence of pericardial effusion. Mitral Valve: The mitral valve is normal in structure. Mild mitral annular calcification. No evidence of mitral valve regurgitation. Tricuspid Valve: The tricuspid valve is normal in structure. Tricuspid valve regurgitation is not demonstrated. Aortic Valve: The aortic valve is tricuspid. There is mild calcification of the aortic valve. Aortic valve regurgitation is mild to moderate. Aortic regurgitation PHT measures 570 msec. Aortic valve sclerosis/calcification is present, without any evidence of aortic stenosis. Aortic valve mean gradient measures 9.0 mmHg. Aortic valve peak gradient measures 20.0 mmHg. Aortic valve area, by VTI measures 3.48 cm. Pulmonic Valve: The pulmonic valve was not well visualized. Pulmonic valve regurgitation is not visualized. No evidence of pulmonic stenosis. Aorta: The aortic root is normal in size and structure. Venous: The inferior vena cava is normal in size with greater than 50% respiratory variability, suggesting right atrial pressure of 3 mmHg. IAS/Shunts: No atrial level shunt detected by color flow Doppler.  LEFT VENTRICLE PLAX 2D LVIDd:         5.70 cm      Diastology LVIDs:         3.90 cm      LV e' medial:    6.31 cm/s LV PW:         0.90 cm      LV E/e' medial:  10.3 LV IVS:        0.80 cm      LV e' lateral:   6.64 cm/s LVOT diam:     2.17 cm      LV E/e' lateral: 9.8 LV SV:         129 LV SV Index:   58 LVOT Area:     3.70 cm  LV Volumes (MOD) LV vol d, MOD A2C: 147.0 ml LV vol d, MOD A4C: 147.0 ml LV vol s, MOD A2C: 55.7 ml LV vol s, MOD A4C: 69.2 ml LV SV  MOD A2C:     91.3 ml LV SV MOD A4C:     147.0 ml LV SV MOD BP:      87.0 ml RIGHT VENTRICLE             IVC RV S prime:     13.40 cm/s  IVC diam: 1.20 cm TAPSE (M-mode): 2.5 cm LEFT ATRIUM             Index        RIGHT ATRIUM  Index LA diam:        4.30 cm 1.94 cm/m   RA Area:     14.80 cm LA Vol (A2C):   61.2 ml 27.66 ml/m  RA Volume:   31.20 ml  14.10 ml/m LA Vol (A4C):   51.4 ml 23.23 ml/m LA Biplane Vol: 57.7 ml 26.08 ml/m  AORTIC VALVE AV Area (Vmax):    2.79 cm AV Area (Vmean):   2.96 cm AV Area (VTI):     3.48 cm AV Vmax:           223.81 cm/s AV Vmean:          138.768 cm/s AV VTI:            0.370 m AV Peak Grad:      20.0 mmHg AV Mean Grad:      9.0 mmHg LVOT Vmax:         169.00 cm/s LVOT Vmean:        111.000 cm/s LVOT VTI:          0.348 m LVOT/AV VTI ratio: 0.94 AI PHT:            570 msec  AORTA Ao Root diam: 3.30 cm Ao Asc diam:  3.60 cm MITRAL VALVE MV Area (PHT): 2.35 cm    SHUNTS MV Decel Time: 323 msec    Systemic VTI:  0.35 m MV E velocity: 65.20 cm/s  Systemic Diam: 2.17 cm MV A velocity: 93.45 cm/s MV E/A ratio:  0.70 Mihai Croitoru MD Electronically signed by Sanda Klein MD Signature Date/Time: 09/19/2022/11:15:35 AM    Final    DG Foot 2 Views Left  Result Date: 09/18/2022 CLINICAL DATA:  Diabetic ulcer of left ankle EXAM: LEFT FOOT - 2 VIEW; LEFT TIBIA AND FIBULA - 2 VIEW COMPARISON:  09/05/2022 FINDINGS: Left tibia/fibula: Frontal and lateral views are obtained. Bones are osteopenic. There are no acute fractures. No cortical destruction or periosteal reaction to suggest osteomyelitis. There is diffuse subcutaneous edema, greatest at the ankle. No subcutaneous gas or radiopaque foreign body. Left foot: Frontal and lateral views of the left foot are obtained. Prior first and second digit transmetatarsal amputation. Prior third digit amputation at the third middle phalanx. There is diffuse soft tissue swelling of the left foot and ankle. The bones are osteopenic. No  bony destruction or periosteal reaction to suggest osteomyelitis. No evidence of acute fracture. IMPRESSION: 1. Extensive soft tissue swelling throughout the left lower leg, greatest at the foot and ankle. 2. Diffuse osteopenia. No acute or destructive bony abnormalities. No radiographic evidence of osteomyelitis. Electronically Signed   By: Randa Ngo M.D.   On: 09/18/2022 20:58   DG Tibia/Fibula Left  Result Date: 09/18/2022 CLINICAL DATA:  Diabetic ulcer of left ankle EXAM: LEFT FOOT - 2 VIEW; LEFT TIBIA AND FIBULA - 2 VIEW COMPARISON:  09/05/2022 FINDINGS: Left tibia/fibula: Frontal and lateral views are obtained. Bones are osteopenic. There are no acute fractures. No cortical destruction or periosteal reaction to suggest osteomyelitis. There is diffuse subcutaneous edema, greatest at the ankle. No subcutaneous gas or radiopaque foreign body. Left foot: Frontal and lateral views of the left foot are obtained. Prior first and second digit transmetatarsal amputation. Prior third digit amputation at the third middle phalanx. There is diffuse soft tissue swelling of the left foot and ankle. The bones are osteopenic. No bony destruction or periosteal reaction to suggest osteomyelitis. No evidence of acute fracture. IMPRESSION: 1. Extensive soft tissue swelling throughout the  left lower leg, greatest at the foot and ankle. 2. Diffuse osteopenia. No acute or destructive bony abnormalities. No radiographic evidence of osteomyelitis. Electronically Signed   By: Randa Ngo M.D.   On: 09/18/2022 20:58   DG CHEST PORT 1 VIEW  Result Date: 09/18/2022 CLINICAL DATA:  Stroke. EXAM: PORTABLE CHEST 1 VIEW COMPARISON:  Chest radiograph 09/13/2022 FINDINGS: The cardiomediastinal silhouette is unchanged. The interstitial markings are increased bilaterally. No lobar consolidation, sizeable pleural effusion, or pneumothorax is identified. No acute osseous abnormality is seen. IMPRESSION: Mild bilateral interstitial  densities which could reflect early edema or atypical infection. Electronically Signed   By: Logan Bores M.D.   On: 09/18/2022 20:25   CT Head Wo Contrast  Result Date: 09/18/2022 CLINICAL DATA:  Neurologic deficit, history of melanoma EXAM: CT HEAD WITHOUT CONTRAST TECHNIQUE: Contiguous axial images were obtained from the base of the skull through the vertex without intravenous contrast. RADIATION DOSE REDUCTION: This exam was performed according to the departmental dose-optimization program which includes automated exposure control, adjustment of the mA and/or kV according to patient size and/or use of iterative reconstruction technique. COMPARISON:  01/24/2022 FINDINGS: Brain: Hypodensities within the right occipital cortex and subcortical white matter are seen, new since prior study, consistent with subacute to chronic ischemic changes. No evidence of acute infarct or hemorrhage. Lateral ventricles and midline structures are unremarkable. There are no acute extra-axial fluid collections. No mass effect. Vascular: Atherosclerosis of the left vertebral artery and internal carotid arteries. No hyperdense vessel. Skull: Normal. Negative for fracture or focal lesion. Sinuses/Orbits: No acute finding. Stable postsurgical changes within the right infraorbital soft tissues consistent with prior melanoma resection. The associated soft tissue edema seen on prior exam has resolved in the interim. Other: Note is made of marked fatty atrophy of the tongue, asymmetric on the left, new since prior study. IMPRESSION: 1. New hypodensities within the right occipital cortex and subcortical white matter, with an appearance most consistent with subacute to chronic ischemic changes. 2. No evidence of acute infarct or hemorrhage. 3. Interval development of marked fatty atrophy of the tongue, left greater than right. 4. Postsurgical changes of the right cheek related to prior melanoma resection. Electronically Signed   By: Randa Ngo M.D.   On: 09/18/2022 17:33   - pertinent xrays, CT, MRI studies were reviewed and independently interpreted  Positive ROS: All other systems have been reviewed and were otherwise negative with the exception of those mentioned in the HPI and as above.  Physical Exam: General: Alert, no acute distress Psychiatric: Patient is competent for consent with normal mood and affect Lymphatic: No axillary or cervical lymphadenopathy Cardiovascular: No pedal edema Respiratory: No cyanosis, no use of accessory musculature GI: No organomegaly, abdomen is soft and non-tender    Images:  @ENCIMAGES @  Labs:  Lab Results  Component Value Date   HGBA1C 6.9 (H) 05/01/2022   HGBA1C 7.5 (H) 09/06/2021   HGBA1C 7.2 (H) 06/26/2021   ESRSEDRATE 77 (H) 09/18/2022   ESRSEDRATE 102 (H) 05/27/2019   ESRSEDRATE 120 (H) 03/25/2017   CRP <0.5 09/18/2022   CRP 1.2 (H) 07/05/2021   CRP 2.0 (H) 07/04/2021   REPTSTATUS 09/11/2022 FINAL 09/06/2022   REPTSTATUS 09/11/2022 FINAL 09/06/2022   GRAMSTAIN  06/28/2021    ABUNDANT WBC PRESENT,BOTH PMN AND MONONUCLEAR FEW YEAST RARE GRAM POSITIVE COCCI    CULT  09/06/2022    NO GROWTH 5 DAYS Performed at [facility], [place] [address], [place], [place] [address]  CULT  09/06/2022    NO GROWTH 5 DAYS Performed at [facility], [place] [address], [place], [place] [address]    LABORGA STREPTOCOCCUS GROUP G 08/22/2022    Lab Results  Component Value Date   ALBUMIN 3.1 (L) 09/18/2022   ALBUMIN 3.0 (L) 09/13/2022   ALBUMIN 2.8 (L) 09/05/2022   PREALBUMIN 23.3 03/25/2017        Latest Ref Rng & Units 09/18/2022    5:01 PM 09/13/2022    9:24 AM 09/13/2022    8:29 AM  CBC EXTENDED  WBC 4.0 - 10.5 K/uL 6.8   7.6   RBC 4.22 - 5.81 MIL/uL 3.99   3.81   Hemoglobin 13.0 - 17.0 g/dL 10.5  11.2  9.9   HCT 39.0 - 52.0 % 34.7  33.0  32.9   Platelets 150 - 400 K/uL 448   317   NEUT# 1.7 - 7.7 K/uL 4.2   5.1   Lymph# 0.7 - 4.0 K/uL 1.9   1.7      Neurologic: Patient does not have protective sensation bilateral lower extremities.   MUSCULOSKELETAL:   Skin: Examination patient has venous and lymphatic insufficiency with brawny edema of the entire left lower extremity.  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  This measures 3 cm in diameter approximately 3 mm deep.  There is no exposed bone or tendon no cellulitis no purulent drainage.  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  White cell count 6.8 hemoglobin 10.5.  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  Plan: Plan: Discussed that we could proceed with compression wraps and evaluate for Kerecis tissue graft in the office.  Patient states that he would not be agreeable to compression.  I will follow-up in the office after discharge and proceed with wound care with the possibility of Kerecis tissue graft.  Thank you for the consult and the opportunity to see Mr. Kincaid Pebley, [place] [phone] 5:01 PM

## 2022-09-19 NOTE — Progress Notes (Signed)
Carotid ultrasound and left lower extremity venous studies completed.   Preliminary results relayed to RN.  Please see CV Procedures for preliminary results.  Patient refused ABI test. Doctor made aware.  Rehema Muffley, RVT  3:41 PM 09/19/22

## 2022-09-19 NOTE — Progress Notes (Signed)
Neurologist came to see this patient and patient complained about his anxiety with this provider. Neurologist ordered a single dose of ativan 1mg  IV to be given now, asked me to let providers be aware of his order. Dr. Bridgett Larsson and Dr. Roel Cluck were notified. Medication given to the patient and was educated of the frequency of PRN ativan one more time.

## 2022-09-19 NOTE — Progress Notes (Signed)
Patient is verbally abusive toward staff, disrespectful, requesting pain and anxiety medications all the time. Yelling to this RN, demanding medications, refusing care. PRN medications for pain and anxiety were given, despite the intervention the patient keeps asking for more and behaving the same way. Dr. Bridgett Larsson and Dr. Roel Cluck aware of the situation, they mentioned the cannot oversedate the patient due to his stroke. Patient was educated about the frequency of his PRN meds and he does not understand, keeps requesting same medications over and over again. Provider asked to let the patient rest. Will continue to monitor.

## 2022-09-19 NOTE — Progress Notes (Signed)
Patient is removing essential equipment, is not following commands.   He has not stopped yelling out from his room the entire night.   He is not consolable. Complains of pain and panic.   Patient refuses care. Primary RN and [facility] MD informed.

## 2022-09-19 NOTE — Progress Notes (Addendum)
STROKE TEAM PROGRESS NOTE   INTERVAL HISTORY Patient is seen in his room with no family at the bedside.  He presents with left hemianopsia but states that his visual difficulties began about 2 weeks ago when his vision became blurry and gradually worsened.  He is not aware of being unable to see in his left visual field but states that things are very blurry.  He can see objects, but they are not distinct.  Vitals:   09/18/22 2330 09/19/22 0445 09/19/22 0800 09/19/22 1252  BP:  (!) 168/79 (!) 157/87 (!) 174/93  Pulse:  100 76 83  Resp:  20 20 20   Temp:  98 F (36.7 C) 98.2 F (36.8 C) 97.7 F (36.5 C)  TempSrc:  Oral Oral Oral  SpO2:  93% 93% 92%  Weight: 112.1 kg     Height: 5\' 7"  (1.702 m)      CBC:  Recent Labs  Lab 09/13/22 0829 09/13/22 0924 09/18/22 1701  WBC 7.6  --  6.8  NEUTROABS 5.1  --  4.2  HGB 9.9* 11.2* 10.5*  HCT 32.9* 33.0* 34.7*  MCV 86.4  --  87.0  PLT 317  --  [id number]   Basic Metabolic Panel:  Recent Labs  Lab 09/13/22 0829 09/13/22 0924 09/18/22 1701  NA 134* 137 134*  K 4.8 4.6 4.6  CL 97*  --  104  CO2 24  --  22  GLUCOSE 174*  --  203*  BUN 23  --  25*  CREATININE 1.96*  --  2.10*  CALCIUM 9.2  --  8.9   Lipid Panel: No results for input(s): "CHOL", "TRIG", "HDL", "CHOLHDL", "VLDL", "LDLCALC" in the last 168 hours. HgbA1c: No results for input(s): "HGBA1C" in the last 168 hours. Urine Drug Screen:  Recent Labs  Lab 09/19/22 0423  LABOPIA POSITIVE*  COCAINSCRNUR NONE DETECTED  LABBENZ NONE DETECTED  AMPHETMU NONE DETECTED  THCU POSITIVE*  LABBARB NONE DETECTED    Alcohol Level No results for input(s): "ETH" in the last 168 hours.  IMAGING past 24 hours VAS [place] LOWER EXTREMITY VENOUS (DVT)  Result Date: 09/19/2022  Lower Venous DVT Study Patient Name:  KHAALIQ SHARPLEY  Date of Exam:   09/19/2022 Medical Rec #: [id number]     Accession #:    [id number] Date of Birth: 11-26-1960    Patient Gender: M Patient Age:   62 years Exam Location:  [facility] Procedure:      VAS [place] LOWER EXTREMITY VENOUS (DVT) Referring Phys: Nyoka Lint DOUTOVA --------------------------------------------------------------------------------  Indications: Edema.  Limitations: Poor ultrasound/tissue interface and patient positioning. Comparison Study: Previous 08/23/22 negative. Performing Technologist: McKayla Maag RVT, [place]  Examination Guidelines: A complete evaluation includes B-mode imaging, spectral Doppler, color Doppler, and power Doppler as needed of all accessible portions of each vessel. Bilateral testing is considered an integral part of a complete examination. Limited examinations for reoccurring indications may be performed as noted. The reflux portion of the exam is performed with the patient in reverse Trendelenburg.  +-----+---------------+---------+-----------+----------+--------------+ RIGHTCompressibilityPhasicitySpontaneityPropertiesThrombus Aging +-----+---------------+---------+-----------+----------+--------------+ CFV  Full           Yes      Yes                                 +-----+---------------+---------+-----------+----------+--------------+ SFJ  Full                                                        +-----+---------------+---------+-----------+----------+--------------+   +---------+---------------+---------+-----------+----------+-------------------+  LEFT     CompressibilityPhasicitySpontaneityPropertiesThrombus Aging      +---------+---------------+---------+-----------+----------+-------------------+ CFV      Full           Yes      Yes                                      +---------+---------------+---------+-----------+----------+-------------------+ SFJ      Full                                                             +---------+---------------+---------+-----------+----------+-------------------+ FV Prox  Full                                                              +---------+---------------+---------+-----------+----------+-------------------+ FV Mid   Full                                                             +---------+---------------+---------+-----------+----------+-------------------+ FV Distal                                             Not visualized due                                                        to patient                                                                positioning         +---------+---------------+---------+-----------+----------+-------------------+ PFV      Full                                                             +---------+---------------+---------+-----------+----------+-------------------+ POP      Full           Yes      Yes                                      +---------+---------------+---------+-----------+----------+-------------------+ PTV      Full  Not well visualized +---------+---------------+---------+-----------+----------+-------------------+ PERO     Full                                         Not well visualized +---------+---------------+---------+-----------+----------+-------------------+    Summary: RIGHT: - No evidence of common femoral vein obstruction.  LEFT: - There is no evidence of deep vein thrombosis in the lower extremity. However, portions of this examination were limited- see technologist comments above.  - No cystic structure found in the popliteal fossa.  *See table(s) above for measurements and observations.    Preliminary    VAS US CAROTID (at [facility] and [facility] only)  Result Date: 09/19/2022 Carotid Arterial Duplex Study Patient Name:  CRISTINA HENSEY  Date of Exam:   09/19/2022 Medical Rec #: [id number]     Accession #:    [id number] Date of Birth: 03-08-1961    Patient Gender: M Patient Age:   62 years Exam Location:  [facility] Procedure:      VAS US CAROTID Referring Phys: Nyoka Lint DOUTOVA  --------------------------------------------------------------------------------  Indications:       CVA and Visual disturbance. Risk Factors:      Hypertension, Diabetes. Limitations        Today's exam was limited due to the body habitus of the                    patient and patient positioning. Comparison Study:  No prior study. Performing Technologist: McKayla Maag RVT, [place]  Examination Guidelines: A complete evaluation includes B-mode imaging, spectral Doppler, color Doppler, and power Doppler as needed of all accessible portions of each vessel. Bilateral testing is considered an integral part of a complete examination. Limited examinations for reoccurring indications may be performed as noted.  Right Carotid Findings: +----------+--------+--------+--------+--------------------------+--------+           PSV cm/sEDV cm/sStenosisPlaque Description        Comments +----------+--------+--------+--------+--------------------------+--------+ CCA Prox  55      2                                                  +----------+--------+--------+--------+--------------------------+--------+ CCA Distal62      11                                                 +----------+--------+--------+--------+--------------------------+--------+ ICA Prox  72      15      1-39%   heterogenous and irregular         +----------+--------+--------+--------+--------------------------+--------+ ICA Mid   90      14                                                 +----------+--------+--------+--------+--------------------------+--------+ ICA Distal65      13                                                 +----------+--------+--------+--------+--------------------------+--------+  ECA       103     9                                                  +----------+--------+--------+--------+--------------------------+--------+ +----------+--------+-------+----------------+-------------------+            PSV cm/sEDV cmsDescribe        Arm Pressure (mmHG) +----------+--------+-------+----------------+-------------------+ [id number]             Multiphasic, WNL                    +----------+--------+-------+----------------+-------------------+ +---------+--------+--+--------+-+---------+ VertebralPSV cm/s36EDV cm/s7Antegrade +---------+--------+--+--------+-+---------+  Left Carotid Findings: +----------+--------+--------+--------+------------------+--------+           PSV cm/sEDV cm/sStenosisPlaque DescriptionComments +----------+--------+--------+--------+------------------+--------+ CCA Prox  90      11                                         +----------+--------+--------+--------+------------------+--------+ CCA Distal89      6                                          +----------+--------+--------+--------+------------------+--------+ ICA Prox  53      8       1-39%                              +----------+--------+--------+--------+------------------+--------+ ICA Mid   52      8                                          +----------+--------+--------+--------+------------------+--------+ ICA Distal31      5                                          +----------+--------+--------+--------+------------------+--------+ ECA       52      2                                          +----------+--------+--------+--------+------------------+--------+ +----------+--------+--------+-----------------------------+-------------------+           PSV cm/sEDV cm/sDescribe                     Arm Pressure (mmHG) +----------+--------+--------+-----------------------------+-------------------+ Subclavian                Not visualized due to patient                                              positioning.                                     +----------+--------+--------+-----------------------------+-------------------+  +---------+--------+--------+-----------------------------------------+  [place] cm/sEDV cm/sNot visualized due to patient positioning +---------+--------+--------+-----------------------------------------+   Summary: Right Carotid: Velocities in the right ICA are consistent with a 1-39% stenosis. Left Carotid: Velocities in the left ICA are consistent with a 1-39% stenosis. Vertebrals:  Right vertebral artery demonstrates antegrade flow. Subclavians: Normal flow hemodynamics were seen in the right subclavian artery. *See table(s) above for measurements and observations.     Preliminary    ECHOCARDIOGRAM COMPLETE  Result Date: 09/19/2022    ECHOCARDIOGRAM REPORT   Patient Name:   MCNEIL VALLO Date of Exam: 09/19/2022 Medical Rec #:  [id number]    Height:       67.0 in Accession #:    [id number]   Weight:       247.1 lb Date of Birth:  10-12-1960   BSA:          2.213 m Patient Age:    59 years     BP:           157/87 mmHg Patient Gender: M            HR:           77 bpm. Exam Location:  Inpatient Procedure: 2D Echo, Cardiac Doppler and Color Doppler Indications:    Stroke  History:        Patient has prior history of Echocardiogram examinations, most                 recent 01/25/2022. COPD; Risk Factors:Hypertension and Diabetes.                 CKD, chronic hypoxic respiratory failure.  Sonographer:    Eartha Inch Referring Phys: [id number] ANASTASSIA DOUTOVA  Sonographer Comments: Technically difficult study due to poor echo windows. Image acquisition challenging due to patient body habitus and Image acquisition challenging due to respiratory motion. IMPRESSIONS  1. Left ventricular ejection fraction, by estimation, is 55 to 60%. The left ventricle has normal function. The left ventricle has no regional wall motion abnormalities. The left ventricular internal cavity size was mildly dilated. Left ventricular diastolic parameters are consistent with Grade I diastolic dysfunction (impaired relaxation).  2.  Right ventricular systolic function is normal. The right ventricular size is normal. Tricuspid regurgitation signal is inadequate for assessing PA pressure.  3. Left atrial size was mildly dilated.  4. The mitral valve is normal in structure. No evidence of mitral valve regurgitation.  5. The aortic valve is tricuspid. There is mild calcification of the aortic valve. Aortic valve regurgitation is mild to moderate. Aortic valve sclerosis/calcification is present, without any evidence of aortic stenosis.  6. The inferior vena cava is normal in size with greater than 50% respiratory variability, suggesting right atrial pressure of 3 mmHg. FINDINGS  Left Ventricle: Left ventricular ejection fraction, by estimation, is 55 to 60%. The left ventricle has normal function. The left ventricle has no regional wall motion abnormalities. The left ventricular internal cavity size was mildly dilated. There is  no left ventricular hypertrophy. Left ventricular diastolic parameters are consistent with Grade I diastolic dysfunction (impaired relaxation). Normal left ventricular filling pressure. Right Ventricle: The right ventricular size is normal. No increase in right ventricular wall thickness. Right ventricular systolic function is normal. Tricuspid regurgitation signal is inadequate for assessing PA pressure. Left Atrium: Left atrial size was mildly dilated. Right Atrium: Right atrial size was normal in size. Pericardium: There is no evidence of pericardial effusion. Mitral Valve: The mitral valve is normal in structure. Mild mitral annular  calcification. No evidence of mitral valve regurgitation. Tricuspid Valve: The tricuspid valve is normal in structure. Tricuspid valve regurgitation is not demonstrated. Aortic Valve: The aortic valve is tricuspid. There is mild calcification of the aortic valve. Aortic valve regurgitation is mild to moderate. Aortic regurgitation PHT measures 570 msec. Aortic valve sclerosis/calcification is  present, without any evidence of aortic stenosis. Aortic valve mean gradient measures 9.0 mmHg. Aortic valve peak gradient measures 20.0 mmHg. Aortic valve area, by VTI measures 3.48 cm. Pulmonic Valve: The pulmonic valve was not well visualized. Pulmonic valve regurgitation is not visualized. No evidence of pulmonic stenosis. Aorta: The aortic root is normal in size and structure. Venous: The inferior vena cava is normal in size with greater than 50% respiratory variability, suggesting right atrial pressure of 3 mmHg. IAS/Shunts: No atrial level shunt detected by color flow Doppler.  LEFT VENTRICLE PLAX 2D LVIDd:         5.70 cm      Diastology LVIDs:         3.90 cm      LV e' medial:    6.31 cm/s LV PW:         0.90 cm      LV E/e' medial:  10.3 LV IVS:        0.80 cm      LV e' lateral:   6.64 cm/s LVOT diam:     2.17 cm      LV E/e' lateral: 9.8 LV SV:         129 LV SV Index:   58 LVOT Area:     3.70 cm  LV Volumes (MOD) LV vol d, MOD A2C: 147.0 ml LV vol d, MOD A4C: 147.0 ml LV vol s, MOD A2C: 55.7 ml LV vol s, MOD A4C: 69.2 ml LV SV MOD A2C:     91.3 ml LV SV MOD A4C:     147.0 ml LV SV MOD BP:      87.0 ml RIGHT VENTRICLE             IVC RV S prime:     13.40 cm/s  IVC diam: 1.20 cm TAPSE (M-mode): 2.5 cm LEFT ATRIUM             Index        RIGHT ATRIUM           Index LA diam:        4.30 cm 1.94 cm/m   RA Area:     14.80 cm LA Vol (A2C):   61.2 ml 27.66 ml/m  RA Volume:   31.20 ml  14.10 ml/m LA Vol (A4C):   51.4 ml 23.23 ml/m LA Biplane Vol: 57.7 ml 26.08 ml/m  AORTIC VALVE AV Area (Vmax):    2.79 cm AV Area (Vmean):   2.96 cm AV Area (VTI):     3.48 cm AV Vmax:           223.81 cm/s AV Vmean:          138.768 cm/s AV VTI:            0.370 m AV Peak Grad:      20.0 mmHg AV Mean Grad:      9.0 mmHg LVOT Vmax:         169.00 cm/s LVOT Vmean:        111.000 cm/s LVOT VTI:          0.348 m LVOT/AV VTI ratio: 0.94 AI PHT:  570 msec  AORTA Ao Root diam: 3.30 cm Ao Asc diam:  3.60 cm  MITRAL VALVE MV Area (PHT): 2.35 cm    SHUNTS MV Decel Time: 323 msec    Systemic VTI:  0.35 m MV E velocity: 65.20 cm/s  Systemic Diam: 2.17 cm MV A velocity: 93.45 cm/s MV E/A ratio:  0.70 Mihai Croitoru MD Electronically signed by Sanda Klein MD Signature Date/Time: 09/19/2022/11:15:35 AM    Final    DG Foot 2 Views Left  Result Date: 09/18/2022 CLINICAL DATA:  Diabetic ulcer of left ankle EXAM: LEFT FOOT - 2 VIEW; LEFT TIBIA AND FIBULA - 2 VIEW COMPARISON:  09/05/2022 FINDINGS: Left tibia/fibula: Frontal and lateral views are obtained. Bones are osteopenic. There are no acute fractures. No cortical destruction or periosteal reaction to suggest osteomyelitis. There is diffuse subcutaneous edema, greatest at the ankle. No subcutaneous gas or radiopaque foreign body. Left foot: Frontal and lateral views of the left foot are obtained. Prior first and second digit transmetatarsal amputation. Prior third digit amputation at the third middle phalanx. There is diffuse soft tissue swelling of the left foot and ankle. The bones are osteopenic. No bony destruction or periosteal reaction to suggest osteomyelitis. No evidence of acute fracture. IMPRESSION: 1. Extensive soft tissue swelling throughout the left lower leg, greatest at the foot and ankle. 2. Diffuse osteopenia. No acute or destructive bony abnormalities. No radiographic evidence of osteomyelitis. Electronically Signed   By: Randa Ngo M.D.   On: 09/18/2022 20:58   DG Tibia/Fibula Left  Result Date: 09/18/2022 CLINICAL DATA:  Diabetic ulcer of left ankle EXAM: LEFT FOOT - 2 VIEW; LEFT TIBIA AND FIBULA - 2 VIEW COMPARISON:  09/05/2022 FINDINGS: Left tibia/fibula: Frontal and lateral views are obtained. Bones are osteopenic. There are no acute fractures. No cortical destruction or periosteal reaction to suggest osteomyelitis. There is diffuse subcutaneous edema, greatest at the ankle. No subcutaneous gas or radiopaque foreign body. Left foot: Frontal  and lateral views of the left foot are obtained. Prior first and second digit transmetatarsal amputation. Prior third digit amputation at the third middle phalanx. There is diffuse soft tissue swelling of the left foot and ankle. The bones are osteopenic. No bony destruction or periosteal reaction to suggest osteomyelitis. No evidence of acute fracture. IMPRESSION: 1. Extensive soft tissue swelling throughout the left lower leg, greatest at the foot and ankle. 2. Diffuse osteopenia. No acute or destructive bony abnormalities. No radiographic evidence of osteomyelitis. Electronically Signed   By: Randa Ngo M.D.   On: 09/18/2022 20:58   DG CHEST PORT 1 VIEW  Result Date: 09/18/2022 CLINICAL DATA:  Stroke. EXAM: PORTABLE CHEST 1 VIEW COMPARISON:  Chest radiograph 09/13/2022 FINDINGS: The cardiomediastinal silhouette is unchanged. The interstitial markings are increased bilaterally. No lobar consolidation, sizeable pleural effusion, or pneumothorax is identified. No acute osseous abnormality is seen. IMPRESSION: Mild bilateral interstitial densities which could reflect early edema or atypical infection. Electronically Signed   By: Logan Bores M.D.   On: 09/18/2022 20:25   CT Head Wo Contrast  Result Date: 09/18/2022 CLINICAL DATA:  Neurologic deficit, history of melanoma EXAM: CT HEAD WITHOUT CONTRAST TECHNIQUE: Contiguous axial images were obtained from the base of the skull through the vertex without intravenous contrast. RADIATION DOSE REDUCTION: This exam was performed according to the departmental dose-optimization program which includes automated exposure control, adjustment of the mA and/or kV according to patient size and/or use of iterative reconstruction technique. COMPARISON:  01/24/2022 FINDINGS: Brain: Hypodensities within the  right occipital cortex and subcortical white matter are seen, new since prior study, consistent with subacute to chronic ischemic changes. No evidence of acute infarct or  hemorrhage. Lateral ventricles and midline structures are unremarkable. There are no acute extra-axial fluid collections. No mass effect. Vascular: Atherosclerosis of the left vertebral artery and internal carotid arteries. No hyperdense vessel. Skull: Normal. Negative for fracture or focal lesion. Sinuses/Orbits: No acute finding. Stable postsurgical changes within the right infraorbital soft tissues consistent with prior melanoma resection. The associated soft tissue edema seen on prior exam has resolved in the interim. Other: Note is made of marked fatty atrophy of the tongue, asymmetric on the left, new since prior study. IMPRESSION: 1. New hypodensities within the right occipital cortex and subcortical white matter, with an appearance most consistent with subacute to chronic ischemic changes. 2. No evidence of acute infarct or hemorrhage. 3. Interval development of marked fatty atrophy of the tongue, left greater than right. 4. Postsurgical changes of the right cheek related to prior melanoma resection. Electronically Signed   By: Randa Ngo M.D.   On: 09/18/2022 17:33    PHYSICAL EXAM General: Alert, chronically ill-appearing elderly patient with right AKA and wounds to left leg in no acute distress  NEURO:  Mental Status: AA&Ox3  Speech/Language: speech is without dysarthria or aphasia.    Cranial Nerves:  II: PERRL.  Left hemianopsia and poor visual acuity with complaints of blurring of vision throughout III, IV, VI: EOMI. Eyelids elevate symmetrically.  V: Sensation is intact to light touch and symmetrical to face.  VII: Smile is symmetrical.  VIII: hearing intact to voice. IX, X: Phonation is normal.  [id number] shrug 5/5. XII: tongue is midline without fasciculations. Motor: 5/5 strength to all muscle groups tested.  Tone: is normal and bulk is normal Sensation- Intact to light touch bilaterally.  Coordination: FTN intact bilaterally within limits of poor vision.No drift.  Gait-  deferred   ASSESSMENT/PLAN Mr. BERAT HEAVEY is a 62 y.o. male with history of diabetes, COPD, depression, right foot osteomyelitis and right AKA, CHF, anemia, CKD, epidural abscess, HSV dermatitis, hyperlipidemia, hypertension, melanoma, neurogenic bladder and recent admission for severe sepsis due to left lower extremity cellulitis and pneumonia presenting with acute onset left hemianopsia.  Patient states states that his visual difficulties began about 1-2 weeks ago when his vision became blurry and gradually worsened.  He is not aware of being unable to see in his left visual field but states that things are very blurry.  He can see objects, but they are not distinct.  Stroke: Likely subacute right occipital stroke, likely large vessel disease given severe vasculopathy CT head new hypodensities in right occipital cortex and subcortical white matter, consistent with subacute to chronic ischemic changes MRI pending MRA pending Carotid Doppler unremarkable Lower extremity Doppler no DVT 2D Echo 55 to 60% LDL pending HgbA1c pending UDS positive for THC VTE prophylaxis -SCDs No antithrombotic prior to admission, now on aspirin 81 mg daily holding off on Plavix due to history of GI bleed and chronic anemia needing transfusion (08/2022) Therapy recommendations: SNF Disposition: Ending  Cellulitis of left lower extremity Recent admission with sepsis, cellulitis, Streptococcus bacteremia LE venous Doppler no DVT On Flagyl vancomycin and Rocephin Management per primary team  PVD Due to uncontrolled diabetes 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 and with constant stump pain Left lower extremity cellulitis, left toe amputation  Hypertension Home meds: None Stable Long-term BP goal normotensive  Hyperlipidemia Home  meds: Rosuvastatin 10 mg daily, fenofibrate 54 mg daily resumed in hospital LDL pending, goal < 70 Now on Lipitor 80 and fenofibrate 54 Continue  statin at discharge  Diabetes type II Controlled Home meds: Lantus 15 units daily, Humalog sliding scale 3 times a day and at bedtime, Trulicity [id number] mg weekly HgbA1c pending, goal < 7.0 CBGs SSI Outpatient PCP close follow-up  Tobacco abuse Current smoker Smoking cessation counseling provided Nicotine patch provided Pt is willing to quit  Other Stroke Risk Factors Advanced Age >/= 39  Obesity, Body mass index is 38.71 kg/m., BMI >/= 30 associated with increased stroke risk, recommend weight loss, diet and exercise as appropriate  CHF  Other Active Problems CKD 3B, creatinine 1.96-> 2.10  Hospital day # [place] , MSN, AGACNP-BC [facility] See Amion for schedule and pager information 09/19/2022 2:16 PM  ATTENDING NOTE: I reviewed above note and agree with the assessment and plan. Pt was seen and examined.   No family at bedside.  Patient not a good historian, agitated with right stump pain, not able to give coherent history about his visual changes.  On my examination, he was able to see out of right eye, counting fingers but stated not able to see on the left eye.  Still has mild right facial droop, decreased right eye closure, but moving all extremities, with right BKA and left lower extremity cellulitis and toe amputation.  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  Pending LDL and A1c.  Currently on aspirin 81, hold off Plavix given chronic anemia needing blood transfusion.  Continue statin and fenofibrate.  Continue antibiotic per primary team.  Will follow.  For detailed assessment and plan, please refer to above/below as I have made changes wherever appropriate.   Rosalin Hawking, MD PhD Stroke Neurology 09/19/2022 6:59 PM    To contact Stroke Continuity provider, please refer to [email or web address] After hours, contact General Neurology

## 2022-09-19 NOTE — Evaluation (Signed)
Physical Therapy Evaluation Patient Details Name: Gregory [name] MRN: [id number] DOB: 1960/10/04 Today's Date: 09/19/2022  History of Present Illness  Pt is a 63 y.o. male who presented 09/18/22 from [facility] with vision loss. CT in the ED revealed a new hypodensity in the right occipital region concerning for subacute vs chronic infarct. PMH: CHF, HTN, HLD, COPD, osteomyelitis, RT AKA, chronic L leg wounds, L toe amputations, CKD3, DM2, anemia, cancer, bil foot drop, HSV dermatitis, malignant melanoma, neurogenic bladder, idiopathic peripheral autonomic neuropathy, anterior cervical corpectomy   Clinical Impression  Pt presents with condition above and deficits mentioned below, see PT Problem List. Prior to this admission, pt was residing at a SNF, and when questioned on his prior level of function, pt became upset and anxious. With increased coaxing, patient able to participate in PT/OT eval. Currently, patient presenting with L leg pain and skin integrity concerns, R distal AKA skin integrity concern, gross weakness, anxiety, decreased activity tolerance, and visual field cut or deficit in L eye. Pt required maxAx2 to transition supine to sit R EOB, but then was able to place himself back in bed at a min guard level, politely refusing repositioning or floating of his L heel. Pt would benefit from further inpatient rehab services, <3 hours/day. Will continue to follow acutely.     Recommendations for follow up therapy are one component of a multi-disciplinary discharge planning process, led by the attending physician.  Recommendations may be updated based on patient status, additional functional criteria and insurance authorization.  Follow Up Recommendations Can patient physically be transported by private vehicle: No     Assistance Recommended at Discharge Intermittent Supervision/Assistance  Patient can return home with the following  Two people to help with walking and/or transfers;A lot of  help with bathing/dressing/bathroom;Assistance with cooking/housework;Direct supervision/assist for medications management;Direct supervision/assist for financial management;Assist for transportation;Help with stairs or ramp for entrance    Equipment Recommendations Other (comment) (defer to next level of care)  Recommendations for Other Services       Functional Status Assessment Patient has had a recent decline in their functional status and demonstrates the ability to make significant improvements in function in a reasonable and predictable amount of time.     Precautions / Restrictions Precautions Precautions: Fall Precaution Comments: visual field cut or deficit in L eye, prior R AKA, L foot/leg wounds Restrictions Weight Bearing Restrictions: No      Mobility  Bed Mobility Overal bed mobility: Needs Assistance Bed Mobility: Supine to Sit, Sit to Supine     Supine to sit: Max assist, +2 for physical assistance, +2 for safety/equipment, HOB elevated Sit to supine: Min guard, HOB elevated   General bed mobility comments: Patient requiring max A of 2 to come to the EOB due to lethargy and anxiety, then min gaurd to return to bed, requesting OT and PT not reposition him in the bed    Transfers Overall transfer level: Needs assistance                 General transfer comment: patient tearfully declining    Ambulation/Gait               General Gait Details: deferred  Stairs            Wheelchair Mobility    Modified Rankin (Stroke Patients Only) Modified Rankin (Stroke Patients Only) Pre-Morbid Rankin Score: Severe disability Modified Rankin: Severe disability     Balance Overall balance assessment: Needs assistance Sitting-balance support:  Feet supported, Bilateral upper extremity supported Sitting balance-Leahy Scale: Fair Sitting balance - Comments: Min guard assist to sit statically EOB       Standing balance comment: deferred                              Pertinent Vitals/Pain Pain Assessment Pain Assessment: Faces Faces Pain Scale: Hurts even more Pain Location: R leg Pain Descriptors / Indicators: Discomfort, Guarding, Grimacing Pain Intervention(s): Limited activity within patient's tolerance, Monitored during session, Repositioned, Patient requesting pain meds-RN notified    Home Living Family/patient expects to be discharged to:: Skilled nursing facility                        Prior Function Prior Level of Function : Needs assist             Mobility Comments: patient was "scooting" to his motorized wheelchair but has not been completing lately per patient ADLs Comments: patient requiring assist for showering and ADLs, when asked how much assist patient became anxious and upset     Hand Dominance   Dominant Hand: Right    Extremity/Trunk Assessment   Upper Extremity Assessment Upper Extremity Assessment: Defer to OT evaluation    Lower Extremity Assessment Lower Extremity Assessment: RLE deficits/detail;LLE deficits/detail RLE Deficits / Details: prior R AKA with noted blue/green tinged scab distally along incision line; denies numbness/tingling LLE Deficits / Details: prior L toe amputations; poor skin integrity below the knee, worse distally with noted edema; denied numbness/tingling; difficulty actively moving L ankle joint, MMT scores of 4- hip flexion, 4+ knee extension    Cervical / Trunk Assessment Cervical / Trunk Assessment: Kyphotic (minimally)  Communication   Communication: No difficulties  Cognition Arousal/Alertness: Lethargic Behavior During Therapy: Anxious, Flat affect Overall Cognitive Status: Difficult to assess                                 General Comments: Patient lethargic initially and anxious and upset upon OT/PT eval attempt. Patient frequently keeping eyes closed, and when attempting to ask direct questions, patient would become  upset requiring increased consoling and time.        General Comments General comments (skin integrity, edema, etc.): pt declining to be repositioned in bed or elevating L heel off bed surface end of session    Exercises     Assessment/Plan    PT Assessment Patient needs continued PT services  PT Problem List Decreased strength;Decreased range of motion;Decreased activity tolerance;Decreased balance;Decreased mobility;Decreased skin integrity;Pain       PT Treatment Interventions DME instruction;Functional mobility training;Therapeutic activities;Therapeutic exercise;Balance training;Neuromuscular re-education;Cognitive remediation;Patient/family education;Wheelchair mobility training    PT Goals (Current goals can be found in the Care Plan section)  Acute Rehab PT Goals Patient Stated Goal: to get pain and anxiety meds, pt reporting fear of losing his vision PT Goal Formulation: With patient Time For Goal Achievement: 10/03/22 Potential to Achieve Goals: Fair    Frequency Min 2X/week     Co-evaluation PT/OT/SLP Co-Evaluation/Treatment: Yes Reason for Co-Treatment: Necessary to address cognition/behavior during functional activity;For patient/therapist safety;To address functional/ADL transfers PT goals addressed during session: Mobility/safety with mobility;Balance         AM-PAC PT "6 Clicks" Mobility  Outcome Measure Help needed turning from your back to your side while in a flat bed without using bedrails?: A  Lot Help needed moving from lying on your back to sitting on the side of a flat bed without using bedrails?: Total Help needed moving to and from a bed to a chair (including a wheelchair)?: Total Help needed standing up from a chair using your arms (e.g., wheelchair or bedside chair)?: Total Help needed to walk in hospital room?: Total Help needed climbing 3-5 steps with a railing? : Total 6 Click Score: 7    End of Session   Activity Tolerance: Treatment  limited secondary to agitation;Patient limited by lethargy Patient left: in bed;with call bell/phone within reach;with bed alarm set Nurse Communication: Mobility status;Patient requests pain meds (pt requests anxiety meds) PT Visit Diagnosis: Unsteadiness on feet (R26.81);Muscle weakness (generalized) (M62.81);Difficulty in walking, not elsewhere classified (R26.2);Pain Pain - Right/Left: Left Pain - part of body: Leg    Time: [id number] PT Time Calculation (min) (ACUTE ONLY): 17 min   Charges:   PT Evaluation $PT Eval Moderate Complexity: 1 Mod          Moishe Spice, PT, DPT Acute Rehabilitation Services  Office: [phone]   Orvan Falconer 09/19/2022, 12:05 PM

## 2022-09-19 NOTE — Progress Notes (Signed)
  Echocardiogram 2D Echocardiogram has been performed.  Gregory [name] 09/19/2022, 10:07 AM

## 2022-09-19 NOTE — Progress Notes (Signed)
SLP Cancellation Note  Patient Details Name: Gregory [name] MRN: [id number] DOB: May 08, 1961   Cancelled treatment:       Reason Eval/Treat Not Completed: Patient declined, no reason specified (Pt was approached for evaluation, but he refused to participate. SLP will follow up on subsequent date)  Azari Janssens I. Hardin Negus, [place], [place] Office number [phone]  Horton Marshall 09/19/2022, 3:35 PM

## 2022-09-19 NOTE — Plan of Care (Signed)
  Problem: Education: Goal: Ability to describe self-care measures that may prevent or decrease complications (Diabetes Survival Skills Education) will improve Outcome: Not Progressing Goal: Individualized Educational Video(s) Outcome: Not Progressing   Problem: Coping: Goal: Ability to adjust to condition or change in health will improve Outcome: Not Progressing   Problem: Fluid Volume: Goal: Ability to maintain a balanced intake and output will improve Outcome: Not Progressing   Problem: Health Behavior/Discharge Planning: Goal: Ability to identify and utilize available resources and services will improve Outcome: Not Progressing Goal: Ability to manage health-related needs will improve Outcome: Not Progressing   Problem: Metabolic: Goal: Ability to maintain appropriate glucose levels will improve Outcome: Not Progressing   Problem: Nutritional: Goal: Maintenance of adequate nutrition will improve Outcome: Not Progressing Goal: Progress toward achieving an optimal weight will improve Outcome: Not Progressing   Problem: Skin Integrity: Goal: Risk for impaired skin integrity will decrease Outcome: Not Progressing   Problem: Tissue Perfusion: Goal: Adequacy of tissue perfusion will improve Outcome: Not Progressing   Problem: Education: Goal: Knowledge of disease or condition will improve Outcome: Not Progressing Goal: Knowledge of secondary prevention will improve (MUST DOCUMENT ALL) Outcome: Not Progressing Goal: Knowledge of patient specific risk factors will improve Gregory [name] N/A or DELETE if not current risk factor) Outcome: Not Progressing   Problem: Ischemic Stroke/TIA Tissue Perfusion: Goal: Complications of ischemic stroke/TIA will be minimized Outcome: Not Progressing   Problem: Coping: Goal: Will verbalize positive feelings about self Outcome: Not Progressing Goal: Will identify appropriate support needs Outcome: Not Progressing   Problem: Health  Behavior/Discharge Planning: Goal: Ability to manage health-related needs will improve Outcome: Not Progressing Goal: Goals will be collaboratively established with patient/family Outcome: Not Progressing   Problem: Self-Care: Goal: Ability to participate in self-care as condition permits will improve Outcome: Not Progressing Goal: Verbalization of feelings and concerns over difficulty with self-care will improve Outcome: Not Progressing Goal: Ability to communicate needs accurately will improve Outcome: Not Progressing   Problem: Nutrition: Goal: Risk of aspiration will decrease Outcome: Not Progressing Goal: Dietary intake will improve Outcome: Not Progressing   Problem: Education: Goal: Knowledge of General Education information will improve Description: Including pain rating scale, medication(s)/side effects and non-pharmacologic comfort measures Outcome: Not Progressing   Problem: Health Behavior/Discharge Planning: Goal: Ability to manage health-related needs will improve Outcome: Not Progressing   Problem: Clinical Measurements: Goal: Ability to maintain clinical measurements within normal limits will improve Outcome: Not Progressing Goal: Will remain free from infection Outcome: Not Progressing Goal: Diagnostic test results will improve Outcome: Not Progressing Goal: Respiratory complications will improve Outcome: Not Progressing Goal: Cardiovascular complication will be avoided Outcome: Not Progressing   Problem: Activity: Goal: Risk for activity intolerance will decrease Outcome: Not Progressing   Problem: Nutrition: Goal: Adequate nutrition will be maintained Outcome: Not Progressing   Problem: Coping: Goal: Level of anxiety will decrease Outcome: Not Progressing   Problem: Elimination: Goal: Will not experience complications related to bowel motility Outcome: Not Progressing Goal: Will not experience complications related to urinary  retention Outcome: Not Progressing   Problem: Pain Managment: Goal: General experience of comfort will improve Outcome: Not Progressing   Problem: Safety: Goal: Ability to remain free from injury will improve Outcome: Not Progressing   Problem: Skin Integrity: Goal: Risk for impaired skin integrity will decrease Outcome: Not Progressing

## 2022-09-20 DIAGNOSIS — H547 Unspecified visual loss: Secondary | ICD-10-CM | POA: Diagnosis not present

## 2022-09-20 DIAGNOSIS — R2990 Unspecified symptoms and signs involving the nervous system: Secondary | ICD-10-CM | POA: Diagnosis not present

## 2022-09-20 DIAGNOSIS — I639 Cerebral infarction, unspecified: Secondary | ICD-10-CM | POA: Diagnosis not present

## 2022-09-20 LAB — GLUCOSE, CAPILLARY
Glucose-Capillary: 138 mg/dL — ABNORMAL HIGH (ref 70–99)
Glucose-Capillary: 149 mg/dL — ABNORMAL HIGH (ref 70–99)
Glucose-Capillary: 171 mg/dL — ABNORMAL HIGH (ref 70–99)
Glucose-Capillary: 178 mg/dL — ABNORMAL HIGH (ref 70–99)
Glucose-Capillary: 201 mg/dL — ABNORMAL HIGH (ref 70–99)
Glucose-Capillary: 230 mg/dL — ABNORMAL HIGH (ref 70–99)

## 2022-09-20 MED ORDER — DOXYCYCLINE HYCLATE 100 MG PO TABS
100.0000 mg | ORAL_TABLET | Freq: Two times a day (BID) | ORAL | Status: DC
  Administered 2022-09-20 – 2022-09-21 (×2): 100 mg via ORAL
  Filled 2022-09-20 (×2): qty 1

## 2022-09-20 MED ORDER — OXYCODONE HCL 5 MG PO TABS
20.0000 mg | ORAL_TABLET | Freq: Three times a day (TID) | ORAL | Status: DC | PRN
  Administered 2022-09-20 – 2022-09-21 (×5): 20 mg via ORAL
  Filled 2022-09-20 (×5): qty 4

## 2022-09-20 MED ORDER — PROCHLORPERAZINE EDISYLATE 10 MG/2ML IJ SOLN
10.0000 mg | Freq: Four times a day (QID) | INTRAMUSCULAR | Status: DC | PRN
  Administered 2022-09-20 – 2022-09-21 (×2): 10 mg via INTRAVENOUS
  Filled 2022-09-20 (×4): qty 2

## 2022-09-20 MED ORDER — DICLOFENAC SODIUM 1 % EX GEL
2.0000 g | Freq: Four times a day (QID) | CUTANEOUS | Status: DC | PRN
  Administered 2022-09-21: 2 g via TOPICAL
  Filled 2022-09-20: qty 100

## 2022-09-20 MED ORDER — ONDANSETRON HCL 4 MG/2ML IJ SOLN
4.0000 mg | Freq: Four times a day (QID) | INTRAMUSCULAR | Status: DC | PRN
  Administered 2022-09-20 (×2): 4 mg via INTRAVENOUS
  Filled 2022-09-20 (×2): qty 2

## 2022-09-20 MED ORDER — GABAPENTIN 300 MG PO CAPS
300.0000 mg | ORAL_CAPSULE | Freq: Two times a day (BID) | ORAL | Status: DC
  Administered 2022-09-20 – 2022-09-21 (×2): 300 mg via ORAL
  Filled 2022-09-20 (×2): qty 1

## 2022-09-20 NOTE — Progress Notes (Signed)
STROKE TEAM PROGRESS NOTE   INTERVAL HISTORY No family at bedside.  Patient lying bed, asking for pain medication for his right leg.  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  MRI showed showed right PCA subacute infarcts with possible petechial or subretinal hemorrhage.  MRI showed right P1/P2 occlusion.  Vitals:   09/19/22 1252 09/19/22 1650 09/20/22 0001 09/20/22 1200  BP:   (!) 158/63 (!) 147/60  Pulse: 83 94 73 65  Resp: 20 18 18 18   Temp:   98.7 F (37.1 C) 98.5 F (36.9 C)  TempSrc: Oral Oral Oral Oral  SpO2: 92% 96% 96% 96%  Weight:      Height:       CBC:  Recent Labs  Lab 09/18/22 1701  WBC 6.8  NEUTROABS 4.2  HGB 10.5*  HCT 34.7*  MCV 87.0  PLT [id number]*   Basic Metabolic Panel:  Recent Labs  Lab 09/18/22 1701  NA 134*  K 4.6  CL 104  CO2 22  GLUCOSE 203*  BUN 25*  CREATININE 2.10*  CALCIUM 8.9   Lipid Panel: No results for input(s): "CHOL", "TRIG", "HDL", "CHOLHDL", "VLDL", "LDLCALC" in the last 168 hours. HgbA1c: No results for input(s): "HGBA1C" in the last 168 hours. Urine Drug Screen:  Recent Labs  Lab 09/19/22 0423  LABOPIA POSITIVE*  COCAINSCRNUR NONE DETECTED  LABBENZ NONE DETECTED  AMPHETMU NONE DETECTED  THCU POSITIVE*  LABBARB NONE DETECTED    Alcohol Level No results for input(s): "ETH" in the last 168 hours.  IMAGING past 24 hours MR BRAIN WO CONTRAST  Result Date: 09/19/2022 CLINICAL DATA:  Stroke suspected EXAM: MRI HEAD WITHOUT CONTRAST MRA HEAD WITHOUT CONTRAST TECHNIQUE: Multiplanar, multi-echo pulse sequences of the brain and surrounding structures were acquired without intravenous contrast. Angiographic images of the Circle of Willis were acquired using MRA technique without intravenous contrast. COMPARISON:  09/18/2022 CT head, 04/06/2012 MRI head FINDINGS: Evaluation is somewhat limited by early termination of the study by the patient. Only diffusion-weighted imaging,  sagittal T1 sequence, and axial T2, FLAIR, and susceptibility weighted sequences were obtained. The obtained series are also limited by motion. MRI HEAD FINDINGS Brain: Mildly increased signal on diffusion-weighted imaging with ADC correlate in the right occipital lobe and posterior temporal lobe (series 2, images 21-33). This is associated with increased T2 hyperintense signal but no definite gyral swelling. Some of these areas may be associated with susceptibility, although this sequences particularly motion limited. No hydrocephalus.  No mass, mass effect, or midline shift. Vascular: Please see MRA findings below. Skull and upper cervical spine: Grossly normal marrow signal. Sinuses/Orbits: No acute finding. Other: None. MRA HEAD FINDINGS Anterior circulation: Both internal carotid arteries are patent to the termini, without significant stenosis. Patent right A1. Aplastic left A1. Normal anterior communicating artery. Anterior cerebral arteries are patent to their distal aspects. No M1 stenosis or occlusion. Normal MCA bifurcations. Distal MCA branches perfused and symmetric. Posterior circulation: Vertebral arteries patent to the vertebrobasilar junction without stenosis. Posterior inferior cerebral arteries patent bilaterally. Basilar patent to its distal aspect. Superior cerebellar arteries patent bilaterally. Patent P1 segments. Occlusion of the distal right P1/proximal right P2 (series 4, images 96-102), without evidence of distal reconstitution. The left PCA and its branches are perfused to their distal aspects without stenosis. The bilateral posterior communicating arteries are not visualized. Anatomic variants: None significant IMPRESSION: 1. Evaluation is somewhat limited by early termination of the study by the patient, with limited  sequences obtained. The obtained series are also limited by motion. 2. Occlusion of the distal right P1/proximal right P2 segment, without evidence of reconstitution of the  right PCA branches. 3. Mildly increased signal on diffusion-weighted images in the right occipital and posterior temporal lobes, favored to represent subacute infarcts, with possible susceptibility from petechial or subarachnoid hemorrhage. 4. No other intracranial large vessel occlusion or significant stenosis. These results will be called to the ordering clinician or representative by the Radiologist Assistant, and communication documented in the PACS or [employer]. Electronically Signed   By: Merilyn Baba M.D.   On: 09/19/2022 21:42   MR ANGIO HEAD WO CONTRAST  Result Date: 09/19/2022 CLINICAL DATA:  Stroke suspected EXAM: MRI HEAD WITHOUT CONTRAST MRA HEAD WITHOUT CONTRAST TECHNIQUE: Multiplanar, multi-echo pulse sequences of the brain and surrounding structures were acquired without intravenous contrast. Angiographic images of the Circle of Willis were acquired using MRA technique without intravenous contrast. COMPARISON:  09/18/2022 CT head, 04/06/2012 MRI head FINDINGS: Evaluation is somewhat limited by early termination of the study by the patient. Only diffusion-weighted imaging, sagittal T1 sequence, and axial T2, FLAIR, and susceptibility weighted sequences were obtained. The obtained series are also limited by motion. MRI HEAD FINDINGS Brain: Mildly increased signal on diffusion-weighted imaging with ADC correlate in the right occipital lobe and posterior temporal lobe (series 2, images 21-33). This is associated with increased T2 hyperintense signal but no definite gyral swelling. Some of these areas may be associated with susceptibility, although this sequences particularly motion limited. No hydrocephalus.  No mass, mass effect, or midline shift. Vascular: Please see MRA findings below. Skull and upper cervical spine: Grossly normal marrow signal. Sinuses/Orbits: No acute finding. Other: None. MRA HEAD FINDINGS Anterior circulation: Both internal carotid arteries are patent to the termini,  without significant stenosis. Patent right A1. Aplastic left A1. Normal anterior communicating artery. Anterior cerebral arteries are patent to their distal aspects. No M1 stenosis or occlusion. Normal MCA bifurcations. Distal MCA branches perfused and symmetric. Posterior circulation: Vertebral arteries patent to the vertebrobasilar junction without stenosis. Posterior inferior cerebral arteries patent bilaterally. Basilar patent to its distal aspect. Superior cerebellar arteries patent bilaterally. Patent P1 segments. Occlusion of the distal right P1/proximal right P2 (series 4, images 96-102), without evidence of distal reconstitution. The left PCA and its branches are perfused to their distal aspects without stenosis. The bilateral posterior communicating arteries are not visualized. Anatomic variants: None significant IMPRESSION: 1. Evaluation is somewhat limited by early termination of the study by the patient, with limited sequences obtained. The obtained series are also limited by motion. 2. Occlusion of the distal right P1/proximal right P2 segment, without evidence of reconstitution of the right PCA branches. 3. Mildly increased signal on diffusion-weighted images in the right occipital and posterior temporal lobes, favored to represent subacute infarcts, with possible susceptibility from petechial or subarachnoid hemorrhage. 4. No other intracranial large vessel occlusion or significant stenosis. These results will be called to the ordering clinician or representative by the Radiologist Assistant, and communication documented in the PACS or [employer]. Electronically Signed   By: Merilyn Baba M.D.   On: 09/19/2022 21:42    PHYSICAL EXAM General: Alert, chronically ill-appearing elderly patient with right AKA and wounds to left leg in no acute distress  NEURO: Not quite cooperative with exam, however, awake, alert, eyes open, orientated to age, place, time. No aphasia, fluent language,  following all simple commands, however agitated with short period of time into exam.  Not  cooperative with name and repeat. No gaze palsy, tracking bilaterally, left mild ptosis, left eye blind, right eye with left hemianopia, PERRL.  Mild right facial droop. Tongue midline. Bilateral UEs 4/5, no drift.  Right AKA, stump 5/5 strength, left toe amputation with discoloration of left leg, 4/5 strength. Sensation symmetrical bilaterally subjectively, b/l FTN intact although slow, gait not tested.     ASSESSMENT/PLAN Mr. Gregory [name] is a 62 y.o. male with history of diabetes, COPD, depression, right foot osteomyelitis and right AKA, CHF, anemia, CKD, epidural abscess, HSV dermatitis, hyperlipidemia, hypertension, melanoma, neurogenic bladder and recent admission for severe sepsis due to left lower extremity cellulitis and pneumonia presenting with acute onset left hemianopsia.  Patient states states that his visual difficulties began about 1-2 weeks ago when his vision became blurry and gradually worsened.  He is not aware of being unable to see in his left visual field but states that things are very blurry.  He can see objects, but they are not distinct.  Stroke: subacute right occipital stroke, likely large vessel disease with right PCA occlusion, consider with severe vasculopathy CT head new hypodensities in right occipital cortex and subcortical white matter, consistent with subacute to chronic ischemic changes MRI Mildly increased signal on diffusion-weighted images in the right occipital and posterior temporal lobes, favored to represent subacute infarcts, with possible susceptibility from petechial or subarachnoid hemorrhage. MRA Occlusion of the distal right P1/proximal right P2 segment, without evidence of reconstitution of the right PCA branches  Carotid Doppler unremarkable Lower extremity Doppler no DVT 2D Echo 55 to 60% LDL patient refused blood draw HgbA1c 6.9 in 04/2022 UDS positive for  THC VTE prophylaxis -SCDs No antithrombotic prior to admission, now on aspirin 81 mg daily holding off on Plavix due to history of GI bleed and chronic anemia needing transfusion (08/2022) Therapy recommendations: SNF Disposition: Ending  Cellulitis of left lower extremity Recent admission with sepsis, cellulitis, Streptococcus bacteremia LE venous Doppler no DVT On Flagyl vancomycin and Rocephin Management per primary team  PVD Due to uncontrolled diabetes 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 and with constant stump pain Left lower extremity cellulitis, left toe amputation  Hypertension Home meds: None Stable Long-term BP goal normotensive  Hyperlipidemia Home meds: Rosuvastatin 10 mg daily, fenofibrate 54 mg daily resumed in hospital LDL patient refused blood draw, goal < 70 Now on Lipitor 80 and fenofibrate 54 Continue statin at discharge  Diabetes type II Controlled Home meds: Lantus 15 units daily, Humalog sliding scale 3 times a day and at bedtime, Trulicity [id number] mg weekly HgbA1c 6.9 in 04/2022, goal < 7.0 CBGs [facility] Outpatient PCP close follow-up  Tobacco abuse Current smoker Smoking cessation counseling provided Nicotine patch provided Pt is willing to quit  Other Stroke Risk Factors Advanced Age >/= 50  Obesity, Body mass index is 38.71 kg/m., BMI >/= 30 associated with increased stroke risk, recommend weight loss, diet and exercise as appropriate  CHF  Other Active Problems CKD 3B, creatinine 1.96-> 2.10  Hospital day # 2  Neurology will sign off. Please call with questions. Pt will follow up with stroke clinic NP at [facility] in about 4 weeks. Thanks for the consult.   Rosalin Hawking, MD PhD Stroke Neurology 09/20/2022 7:05 PM    To contact Stroke Continuity provider, please refer to [email or web address] After hours, contact General Neurology

## 2022-09-20 NOTE — Progress Notes (Signed)
Progress Note   Patient: Gregory [name] [name] DOB: [birth year] DOA: 09/18/2022     2 DOS: the patient was seen and examined on 09/20/2022   Brief hospital course: 62 y.o. male with medical history significant of symptomatic anemia, chronic diastolic CHF, hypertension, DM 2, CKD stage IIIb, chronic hypoxic respiratory failure, COPD chronic hypoxic respiratory failure     Presented with loss of vision Patient presents from [facility] patient states that he cannot see his vision chronic back he was riding his motorized wheelchair when EMS arrived has history of prior visual loss secondary to a blood clot on patient states he has been having intermittent visual loss for the past few weeks but worse today Also endorses nausea foul-smelling urine states that he can only now see shapes no vomiting but does endorse abdominal discomfort which is diffuse otherwise no other localized neurological deficits no eye pain  Assessment and Plan: Stroke, ruled in Ct showing New hypodensities within the right occipital cortex and subcortical white matter, with an appearance most consistent with subacute to chronic ischemic changes. -MRI was ordered, however pt has refused thus far -Carotid Doppler reviewed. No significant stenosis -Echo reviewed. Normal LVEF no atrial level shunts noted -Cont antiplatelet ASA 81  statin change to lipitor 80 mg po q day -Allowing permissive Hypertension keep BP <220/120, gradually normalize in 5-7 days -MRI reviewed.  Confirms occlusion of the distal right P1/prox R P2 segment w/o evidence of reconstitution of the R PCA branches.  Also findings suggesting subacute infarcts involving the right occipital and posterior temporal lobes.    COPD (chronic obstructive pulmonary disease) Chronic stable continue home meds, O2 as needed    Chronic diastolic HF (heart failure) Lasix was on hold for some dehydration   HTN (hypertension) Allow permissive htn   HLD  (hyperlipidemia) Now on atorvastatin 80mg  daily   Type II diabetes mellitus with neurological manifestations  - Order Sensitive  SSI   - continue home insulin Lantus but decreased to  10 units,    GERD without esophagitis Continue protonix 40 mg po bid   Diabetic ulcer of lower leg Noted foul discharge from diabetic foot ulcer of the ankle.  Plain images reviewed, no obvious evidence of osteomyelitis Pt has refused MRI this AM CRP was elevated -Had been on ceftriaxone metronidazole and Vanco -Consulted Orthopedic Surgery, appreciate input by [name]. Recommendation for compression wraps and evaluate Kerecis tissue graft in office. Pt was not agreeable to compression. Orthopedic Surgery to follow upw ith Pt in the office after discharge and proceed with wound care with the possibility of Kerecis tissue graft -Patient refusing blood draw for bank level.  Discussed with pharmacy.  Transition to Rocephin with doxycycline   Constipation Cont on bowel regimen as needed   Tobacco abuse  - Admitting physician discussed importance of quitting   -At this point patient is   NOT  interested in quitting  - order nicotine patch     CKD (chronic kidney disease), stage III  -chronic avoid nephrotoxic medications such as NSAIDs, Vanco Zosyn combo,  avoid hypotension, continue to follow renal function    Normocytic anemia Chronic stable continue to follow CBC   Above knee amputation of right lower extremity Chronic stable  Chronic pain -Reportedly on 20mg  oxycodone q8h PRN PTA -Confirmed by pharmacy -Will resume      Subjective: Complaining of chronic pains, requesting his home oxycodone  Physical Exam: Vitals:   09/19/22 1252 09/19/22 1650 09/20/22 0001 09/20/22 1200  BP: (!) 174/93 (!) 164/81 (!) 158/63 (!) 147/60  Pulse: 83 94 73 65  Resp: 20 18 18 18   Temp:   98.7 F (37.1 C) 98.5 F (36.9 C)  TempSrc: Oral Oral Oral Oral  SpO2: 92% 96% 96% 96%  Weight:      Height:        General exam: Conversant, in no acute distress Respiratory system: normal chest rise, clear, no audible wheezing Cardiovascular system: regular rhythm, s1-s2 Gastrointestinal system: Nondistended, nontender, pos BS Central nervous system: No seizures, no tremors Extremities: No cyanosis, no joint deformities Skin: No rashes, no pallor Psychiatry: Affect normal // no auditory hallucinations   Data Reviewed:  There are no new results to review at this time.  Family Communication: Pt in room, family not at bedside  Disposition: Status is: Inpatient Remains inpatient appropriate because: Severity of illness  Planned Discharge Destination: Skilled nursing facility   Author: Marylu Lund, MD 09/20/2022 2:36 PM  For on call review [email or web address].

## 2022-09-21 DIAGNOSIS — I639 Cerebral infarction, unspecified: Secondary | ICD-10-CM | POA: Diagnosis not present

## 2022-09-21 DIAGNOSIS — H547 Unspecified visual loss: Secondary | ICD-10-CM | POA: Diagnosis not present

## 2022-09-21 DIAGNOSIS — R2990 Unspecified symptoms and signs involving the nervous system: Secondary | ICD-10-CM | POA: Diagnosis not present

## 2022-09-21 LAB — GLUCOSE, CAPILLARY
Glucose-Capillary: 134 mg/dL — ABNORMAL HIGH (ref 70–99)
Glucose-Capillary: 149 mg/dL — ABNORMAL HIGH (ref 70–99)
Glucose-Capillary: 162 mg/dL — ABNORMAL HIGH (ref 70–99)
Glucose-Capillary: 177 mg/dL — ABNORMAL HIGH (ref 70–99)
Glucose-Capillary: 229 mg/dL — ABNORMAL HIGH (ref 70–99)

## 2022-09-21 LAB — COMPREHENSIVE METABOLIC PANEL
ALT: 12 U/L (ref 0–44)
AST: 16 U/L (ref 15–41)
Albumin: 2.8 g/dL — ABNORMAL LOW (ref 3.5–5.0)
Alkaline Phosphatase: 60 U/L (ref 38–126)
Anion gap: 8 (ref 5–15)
BUN: 21 mg/dL (ref 8–23)
CO2: 23 mmol/L (ref 22–32)
Calcium: 8.8 mg/dL — ABNORMAL LOW (ref 8.9–10.3)
Chloride: 105 mmol/L (ref 98–111)
Creatinine, Ser: 1.6 mg/dL — ABNORMAL HIGH (ref 0.61–1.24)
GFR, Estimated: 48 mL/min — ABNORMAL LOW (ref >60)
Glucose, Bld: 226 mg/dL — ABNORMAL HIGH (ref 70–99)
Potassium: 4.1 mmol/L (ref 3.5–5.1)
Sodium: 136 mmol/L (ref 135–145)
Total Bilirubin: 0.5 mg/dL (ref 0.3–1.2)
Total Protein: 6.8 g/dL (ref 6.5–8.1)

## 2022-09-21 LAB — CBC
HCT: 33.6 % — ABNORMAL LOW (ref 39.0–52.0)
Hemoglobin: 10.3 g/dL — ABNORMAL LOW (ref 13.0–17.0)
MCH: 26.2 pg (ref 26.0–34.0)
MCHC: 30.7 g/dL (ref 30.0–36.0)
MCV: 85.5 fL (ref 80.0–100.0)
Platelets: 323 10*3/uL (ref 150–400)
RBC: 3.93 MIL/uL — ABNORMAL LOW (ref 4.22–5.81)
RDW: 16.3 % — ABNORMAL HIGH (ref 11.5–15.5)
WBC: 7 10*3/uL (ref 4.0–10.5)
nRBC: 0 % (ref 0.0–0.2)

## 2022-09-21 MED ORDER — PANTOPRAZOLE SODIUM 40 MG PO TBEC
40.0000 mg | DELAYED_RELEASE_TABLET | Freq: Every day | ORAL | Status: DC
  Administered 2022-09-21: 40 mg via ORAL
  Filled 2022-09-21: qty 1

## 2022-09-21 MED ORDER — ASPIRIN 81 MG PO TBEC: 81.0000 mg | DELAYED_RELEASE_TABLET | Freq: Every day | ORAL | 0 refills | Status: AC

## 2022-09-21 MED ORDER — AMOXICILLIN-POT CLAVULANATE 875-125 MG PO TABS: 1.0000 | ORAL_TABLET | Freq: Two times a day (BID) | ORAL | Status: DC

## 2022-09-21 MED ORDER — ATORVASTATIN CALCIUM 80 MG PO TABS: 80.0000 mg | ORAL_TABLET | Freq: Every day | ORAL | 0 refills | Status: DC

## 2022-09-21 MED ORDER — FENOFIBRATE 54 MG PO TABS: 54.0000 mg | ORAL_TABLET | Freq: Every day | ORAL | 0 refills | Status: DC

## 2022-09-21 MED ORDER — AMOXICILLIN-POT CLAVULANATE 875-125 MG PO TABS: 1.0000 | ORAL_TABLET | Freq: Two times a day (BID) | ORAL | 0 refills | Status: AC

## 2022-09-21 MED ORDER — GABAPENTIN 300 MG PO CAPS: 300.0000 mg | ORAL_CAPSULE | Freq: Two times a day (BID) | ORAL | 0 refills | Status: DC

## 2022-09-21 MED ORDER — DOXYCYCLINE HYCLATE 100 MG PO TABS: 100.0000 mg | ORAL_TABLET | Freq: Two times a day (BID) | ORAL | 0 refills | Status: AC

## 2022-09-21 MED ORDER — OXYCODONE HCL 20 MG PO TABS: 20.0000 mg | ORAL_TABLET | Freq: Three times a day (TID) | ORAL | 0 refills | Status: DC | PRN

## 2022-09-21 NOTE — Discharge Summary (Addendum)
Physician Discharge Summary   Patient: Gregory [name] MRN: [id number] DOB: [birth year]  Admit date:     09/18/2022  Discharge date: 09/21/22  Discharge Physician: Rickey Barbara   PCP: Sherron Monday, MD   Recommendations at discharge:    Follow up with PCP in 1-2 weeks Recommend referral to opthalmologist  Discharge Diagnoses: Principal Problem:   Stroke-like symptom Active Problems:   COPD (chronic obstructive pulmonary disease)   Chronic diastolic HF (heart failure)   HTN (hypertension)   HLD (hyperlipidemia)   Type II diabetes mellitus with neurological manifestations   GERD without esophagitis   Above knee amputation of right lower extremity   CKD (chronic kidney disease), stage III   Tobacco abuse   Normocytic anemia   Diabetic ulcer of lower leg   Constipation   Skin ulcer of left ankle, limited to breakdown of skin  Resolved Problems:   * No resolved hospital problems. *  Hospital Course: 62 y.o. male with medical history significant of symptomatic anemia, chronic diastolic CHF, hypertension, DM 2, CKD stage IIIb, chronic hypoxic respiratory failure, COPD chronic hypoxic respiratory failure     Presented with loss of vision Patient presents from [facility] patient states that he cannot see his vision chronic back he was riding his motorized wheelchair when EMS arrived has history of prior visual loss secondary to a blood clot on patient states he has been having intermittent visual loss for the past few weeks but worse today Also endorses nausea foul-smelling urine states that he can only now see shapes no vomiting but does endorse abdominal discomfort which is diffuse otherwise no other localized neurological deficits no eye pain  Assessment and Plan: Stroke, ruled in Ct showing New hypodensities within the right occipital cortex and subcortical white matter, with an appearance most consistent with subacute to chronic ischemic changes. -MRI was ordered, however  pt has refused thus far -Carotid Doppler reviewed. No significant stenosis -Echo reviewed. Normal LVEF no atrial level shunts noted -Cont antiplatelet ASA 81,  statin change to lipitor 80 mg po q day -Off plavix secondary to hx GI bleeding -Pt refused blood draws, including for lipids -Allowing permissive Hypertension keep BP <220/120, gradually normalize in 5-7 days -MRI reviewed.  Confirms occlusion of the distal right P1/prox R P2 segment w/o evidence of reconstitution of the R PCA branches.  Also findings suggesting subacute infarcts involving the right occipital and posterior temporal lobes.    COPD (chronic obstructive pulmonary disease) Chronic stable continue home meds, O2 as needed    Chronic diastolic HF (heart failure) Lasix was on hold for some dehydration   HTN (hypertension) Allow permissive htn, gradually normalize after 5-7 days   HLD (hyperlipidemia) Now on atorvastatin 80mg  daily   Type II diabetes mellitus with neurological manifestations  - Order Sensitive  SSI   - continue home insulin Lantus but decreased to  10 units,    GERD without esophagitis Continue protonix 40 mg po bid   Diabetic ulcer of lower leg Noted foul discharge from diabetic foot ulcer of the ankle.  Plain images reviewed, no obvious evidence of osteomyelitis Pt has refused MRI this AM CRP was elevated -Had been on ceftriaxone metronidazole and Vanco -Consulted Orthopedic Surgery, appreciate input by Dr. Lajoyce Corners. Recommendation for compression wraps and evaluate Kerecis tissue graft in office. Pt was not agreeable to compression. Orthopedic Surgery to follow upw ith Pt in the office after discharge and proceed with wound care with the possibility of Kerecis tissue graft -  Patient refusing blood draw for bank level.  Discussed with pharmacy.  Transition to Augmentin with doxycycline, to complete on 4/11   Constipation Cont on bowel regimen as needed   Tobacco abuse  - Admitting physician  discussed importance of quitting   -At this point patient is NOT  interested in quitting  - order nicotine patch while in hospital    CKD (chronic kidney disease), stage III  -Had refused subsequent lab draws - appears medically stable. Voiding well    Normocytic anemia Chronic stable continue to follow CBC   Above knee amputation of right lower extremity Chronic stable -seen by pt's primary Orthopedic Surgeon   Chronic pain -Reportedly on 20mg  oxycodone q8h PRN PTA -Confirmed by pharmacy -Resumed this visit -Concern for drug seeking behaviors. Use narcotic with caution, low threshold for weaning down further -Have added scheduled neurontin to aid in better pain control       Consultants: Orthopedic Surgery, Neurology Procedures performed:   Disposition: Skilled nursing facility Diet recommendation:  Carb modified diet DISCHARGE MEDICATION: Allergies as of 09/21/2022       Reactions   Latex Other (See Comments)   "ALLERGIC," per [facility]        Medication List     STOP taking these medications    methenamine 1 g tablet Commonly known as: MANDELAMINE   mirabegron ER 50 MG Tb24 tablet Commonly known as: MYRBETRIQ   oxybutynin 10 MG 24 hr tablet Commonly known as: DITROPAN-XL   rosuvastatin 10 MG tablet Commonly known as: CRESTOR   Uribel 118 MG Caps       TAKE these medications    Afrin 12 Hour 0.05 % nasal spray Generic drug: oxymetazoline Place 1 spray into both nostrils every 12 (twelve) hours as needed for congestion.   amoxicillin-clavulanate 875-125 MG tablet Commonly known as: AUGMENTIN Take 1 tablet by mouth every 12 (twelve) hours for 6 days.   aspirin EC 81 MG tablet Take 1 tablet (81 mg total) by mouth daily. Swallow whole. Start taking on: September 22, 2022   atorvastatin 80 MG tablet Commonly known as: LIPITOR Take 1 tablet (80 mg total) by mouth daily. Start taking on: September 22, 2022   cetirizine 10 MG tablet Commonly known as:  ZYRTEC Take 10 mg by mouth daily.   cyanocobalamin 1000 MCG tablet Take 1,000 mcg by mouth daily.   diclofenac Sodium 1 % Gel Commonly known as: VOLTAREN Apply 4 g topically See admin instructions. Apply to right leg topically every 6 hours as needed for pain   docusate sodium 100 MG capsule Commonly known as: COLACE Take 1 capsule (100 mg total) by mouth 2 (two) times daily.   doxycycline 100 MG tablet Commonly known as: VIBRA-TABS Take 1 tablet (100 mg total) by mouth every 12 (twelve) hours for 6 days.   fenofibrate 54 MG tablet Take 1 tablet (54 mg total) by mouth daily. Start taking on: September 22, 2022 What changed:  medication strength how much to take   ferrous sulfate 325 (65 FE) MG tablet Take 1 tablet (325 mg total) by mouth daily with breakfast.   fluticasone 50 MCG/ACT nasal spray Commonly known as: FLONASE Place 1 spray into both nostrils in the morning.   furosemide 40 MG tablet Commonly known as: LASIX Take 1 tablet (40 mg total) by mouth daily.   gabapentin 300 MG capsule Commonly known as: NEURONTIN Take 1 capsule (300 mg total) by mouth 2 (two) times daily.   Glucagon Emergency 1  MG Kit Inject 1 mg into the vein as needed (for hypolycemia).   hydrOXYzine 25 MG tablet Commonly known as: ATARAX Take 25 mg by mouth every 8 (eight) hours as needed for anxiety.   hydrOXYzine 50 MG capsule Commonly known as: VISTARIL Take 50 mg by mouth at bedtime.   insulin lispro 100 UNIT/ML KwikPen Commonly known as: HUMALOG Inject 0-10 Units into the skin See admin instructions. Inject 0-10 units into the skin three times a day and at bedtime, PER SLIDING SCALE:  BGL 151-200 = 2 units 201-250 = 4 units 251-300 = 6 units 301-350 =  8 units 351-400 = 10 units What changed: additional instructions   ipratropium-albuterol 0.5-2.5 (3) MG/3ML Soln Commonly known as: DUONEB Take 3 mLs by nebulization every 6 (six) hours as needed (for shortness of breath).    lactulose 10 GM/15ML solution Commonly known as: CHRONULAC Take 15 mLs (10 g total) by mouth 3 (three) times daily as needed for moderate constipation. What changed: when to take this   Lantus SoloStar 100 UNIT/ML Solostar Pen Generic drug: insulin glargine Inject 15 Units into the skin daily. What changed: when to take this   lidocaine 4 % Apply 1 patch topically See admin instructions. Apply 1 patch to both the right shoulder and the right stump at 9 AM daily- remove at 9 PM   Lubricant Eye Drops 0.4-0.3 % Soln Generic drug: Polyethyl Glycol-Propyl Glycol Place 2 drops into both eyes every 4 (four) hours as needed (dryness/itching).   magnesium oxide 400 MG tablet Commonly known as: MAG-OX Take 400 mg by mouth 2 (two) times daily.   melatonin 3 MG Tabs tablet Take 3 mg by mouth at bedtime.   metoprolol tartrate 25 MG tablet Commonly known as: LOPRESSOR Take 0.5 tablets (12.5 mg total) by mouth 2 (two) times daily.   multivitamin with minerals tablet Take 1 tablet by mouth daily.   PreserVision AREDS 2 Caps Take 1 capsule by mouth in the morning and at bedtime.   Narcan 4 MG/0.1ML Liqd nasal spray kit Generic drug: naloxone Place 1 spray into the nose as needed (poorly responsive or turning blue).   Natural Balance Tears 0.1-0.3 % Soln Generic drug: Dextran 70-Hypromellose Apply 1 drop to eye every 4 (four) hours as needed (dry eyes).   omeprazole 20 MG capsule Commonly known as: PRILOSEC Take 20 mg by mouth at bedtime.   ondansetron 4 MG tablet Commonly known as: ZOFRAN Take 4 mg by mouth every 8 (eight) hours as needed for nausea.   Oxycodone HCl 20 MG Tabs Take 1 tablet (20 mg total) by mouth every 8 (eight) hours as needed (severe pain).   OXYGEN Inhale 2 L/min into the lungs as needed (for shortness of breath- to maintain sats >90%).   polyethylene glycol 17 g packet Commonly known as: MIRALAX / GLYCOLAX Take 17 g by mouth daily as needed for mild  constipation.   Pro-Stat Liqd Take 30 mLs by mouth in the morning.   prochlorperazine 10 MG tablet Commonly known as: COMPAZINE Take 10 mg by mouth every 6 (six) hours as needed for nausea or vomiting.   sennosides-docusate sodium 8.6-50 MG tablet Commonly known as: SENOKOT-S Take 1 tablet by mouth at bedtime.   tamsulosin 0.4 MG Caps capsule Commonly known as: FLOMAX Take 0.4 mg by mouth daily.   tiotropium 18 MCG inhalation capsule Commonly known as: SPIRIVA Place 18 mcg into inhaler and inhale daily.   Trulicity 0.75 MG/0.5ML Sopn Generic drug:  Dulaglutide Inject 0.75 mg into the skin every Wednesday.        Contact information for follow-up providers     Nadara Mustard, MD Follow up in 1 week(s).   Specialty: Orthopedic Surgery Contact information: [address] [place] [address] [phone]         [facility]. Schedule an appointment as soon as possible for a visit in 1 month(s).   Specialty: Neurology Why: stroke clinic Contact information: [address] [place] [address] [phone]             Contact information for after-discharge care     Destination     [facility] Preferred SNF .   Service: Skilled Nursing Contact information: [address] [place] [address] [phone]                    Discharge Exam: Ceasar Mons Weights   09/18/22 1604 09/18/22 2330  Weight: 112.1 kg 112.1 kg   General exam: Awake, laying in bed, in nad Respiratory system: Normal respiratory effort, no wheezing Cardiovascular system: regular rate, s1, s2 Gastrointestinal system: Soft, nondistended, positive BS Central nervous system: CN2-12 grossly intact, strength intact Extremities: Perfused, no clubbing Skin: Normal skin turgor, no notable skin lesions seen Psychiatry: Mood normal // no visual hallucinations   Condition at discharge: fair  The  results of significant diagnostics from this hospitalization (including imaging, microbiology, ancillary and laboratory) are listed below for reference.   Imaging Studies: VAS [place] LOWER EXTREMITY VENOUS (DVT)  Result Date: 09/19/2022  Lower Venous DVT Study Patient Name:  BAYLON SANTELLI  Date of Exam:   09/19/2022 Medical Rec #: [id number]     Accession #:    [id number] Date of Birth: 1960-09-29    Patient Gender: M Patient Age:   25 years Exam Location:  [facility] Procedure:      VAS [place] LOWER EXTREMITY VENOUS (DVT) Referring Phys: Jonny Ruiz DOUTOVA --------------------------------------------------------------------------------  Indications: Edema.  Limitations: Poor ultrasound/tissue interface and patient positioning. Comparison Study: Previous 08/23/22 negative. Performing Technologist: McKayla Maag RVT, [place]  Examination Guidelines: A complete evaluation includes B-mode imaging, spectral Doppler, color Doppler, and power Doppler as needed of all accessible portions of each vessel. Bilateral testing is considered an integral part of a complete examination. Limited examinations for reoccurring indications may be performed as noted. The reflux portion of the exam is performed with the patient in reverse Trendelenburg.  +-----+---------------+---------+-----------+----------+--------------+ RIGHTCompressibilityPhasicitySpontaneityPropertiesThrombus Aging +-----+---------------+---------+-----------+----------+--------------+ CFV  Full           Yes      Yes                                 +-----+---------------+---------+-----------+----------+--------------+ SFJ  Full                                                        +-----+---------------+---------+-----------+----------+--------------+   +---------+---------------+---------+-----------+----------+-------------------+ LEFT     CompressibilityPhasicitySpontaneityPropertiesThrombus Aging       +---------+---------------+---------+-----------+----------+-------------------+ CFV      Full           Yes      Yes                                      +---------+---------------+---------+-----------+----------+-------------------+  SFJ      Full                                                             +---------+---------------+---------+-----------+----------+-------------------+ FV Prox  Full                                                             +---------+---------------+---------+-----------+----------+-------------------+ FV Mid   Full                                                             +---------+---------------+---------+-----------+----------+-------------------+ FV Distal                                             Not visualized due                                                        to patient                                                                positioning         +---------+---------------+---------+-----------+----------+-------------------+ PFV      Full                                                             +---------+---------------+---------+-----------+----------+-------------------+ POP      Full           Yes      Yes                                      +---------+---------------+---------+-----------+----------+-------------------+ PTV      Full                                         Not well visualized +---------+---------------+---------+-----------+----------+-------------------+ PERO     Full  Not well visualized +---------+---------------+---------+-----------+----------+-------------------+     Summary: RIGHT: - No evidence of common femoral vein obstruction.  LEFT: - There is no evidence of deep vein thrombosis in the lower extremity. However, portions of this examination were limited- see technologist comments above.  - No cystic  structure found in the popliteal fossa.  *See table(s) above for measurements and observations. Electronically signed by Heath Lark on 09/19/2022 at 9:57:46 PM.    Final    VAS US CAROTID (at [facility] and [facility] only)  Result Date: 09/19/2022 Carotid Arterial Duplex Study Patient Name:  PREET PERRIER  Date of Exam:   09/19/2022 Medical Rec #: [id number]     Accession #:    [id number] Date of Birth: 1961-05-29    Patient Gender: M Patient Age:   67 years Exam Location:  [facility] Procedure:      VAS US CAROTID Referring Phys: Jonny Ruiz DOUTOVA --------------------------------------------------------------------------------  Indications:       CVA and Visual disturbance. Risk Factors:      Hypertension, Diabetes. Limitations        Today's exam was limited due to the body habitus of the                    patient and patient positioning. Comparison Study:  No prior study. Performing Technologist: McKayla Maag RVT, [place]  Examination Guidelines: A complete evaluation includes B-mode imaging, spectral Doppler, color Doppler, and power Doppler as needed of all accessible portions of each vessel. Bilateral testing is considered an integral part of a complete examination. Limited examinations for reoccurring indications may be performed as noted.  Right Carotid Findings: +----------+--------+--------+--------+--------------------------+--------+           PSV cm/sEDV cm/sStenosisPlaque Description        Comments +----------+--------+--------+--------+--------------------------+--------+ CCA Prox  55      2                                                  +----------+--------+--------+--------+--------------------------+--------+ CCA Distal62      11                                                 +----------+--------+--------+--------+--------------------------+--------+ ICA Prox  72      15      1-39%   heterogenous and irregular          +----------+--------+--------+--------+--------------------------+--------+ ICA Mid   90      14                                                 +----------+--------+--------+--------+--------------------------+--------+ ICA Distal65      13                                                 +----------+--------+--------+--------+--------------------------+--------+ ECA       103     9                                                  +----------+--------+--------+--------+--------------------------+--------+ +----------+--------+-------+----------------+-------------------+  PSV cm/sEDV cmsDescribe        Arm Pressure (mmHG) +----------+--------+-------+----------------+-------------------+ [id number]             Multiphasic, WNL                    +----------+--------+-------+----------------+-------------------+ +---------+--------+--+--------+-+---------+ VertebralPSV cm/s36EDV cm/s7Antegrade +---------+--------+--+--------+-+---------+  Left Carotid Findings: +----------+--------+--------+--------+------------------+--------+           PSV cm/sEDV cm/sStenosisPlaque DescriptionComments +----------+--------+--------+--------+------------------+--------+ CCA Prox  90      11                                         +----------+--------+--------+--------+------------------+--------+ CCA Distal89      6                                          +----------+--------+--------+--------+------------------+--------+ ICA Prox  53      8       1-39%                              +----------+--------+--------+--------+------------------+--------+ ICA Mid   52      8                                          +----------+--------+--------+--------+------------------+--------+ ICA Distal31      5                                          +----------+--------+--------+--------+------------------+--------+ ECA       52      2                                           +----------+--------+--------+--------+------------------+--------+ +----------+--------+--------+-----------------------------+-------------------+           PSV cm/sEDV cm/sDescribe                     Arm Pressure (mmHG) +----------+--------+--------+-----------------------------+-------------------+ Subclavian                Not visualized due to patient                                              positioning.                                     +----------+--------+--------+-----------------------------+-------------------+ +---------+--------+--------+-----------------------------------------+ VertebralPSV cm/sEDV cm/sNot visualized due to patient positioning +---------+--------+--------+-----------------------------------------+   Summary: Right Carotid: Velocities in the right ICA are consistent with a 1-39% stenosis. Left Carotid: Velocities in the left ICA are consistent with a 1-39% stenosis. Vertebrals:  Right vertebral artery demonstrates antegrade flow. Subclavians: Normal flow hemodynamics were seen in the right subclavian artery. *See table(s) above for measurements and observations.  Electronically signed by Heath Lark on 09/19/2022 at 9:57:24 PM.  Final    MR BRAIN WO CONTRAST  Result Date: 09/19/2022 CLINICAL DATA:  Stroke suspected EXAM: MRI HEAD WITHOUT CONTRAST MRA HEAD WITHOUT CONTRAST TECHNIQUE: Multiplanar, multi-echo pulse sequences of the brain and surrounding structures were acquired without intravenous contrast. Angiographic images of the Circle of Willis were acquired using MRA technique without intravenous contrast. COMPARISON:  09/18/2022 CT head, 04/06/2012 MRI head FINDINGS: Evaluation is somewhat limited by early termination of the study by the patient. Only diffusion-weighted imaging, sagittal T1 sequence, and axial T2, FLAIR, and susceptibility weighted sequences were obtained. The obtained series are also limited by  motion. MRI HEAD FINDINGS Brain: Mildly increased signal on diffusion-weighted imaging with ADC correlate in the right occipital lobe and posterior temporal lobe (series 2, images 21-33). This is associated with increased T2 hyperintense signal but no definite gyral swelling. Some of these areas may be associated with susceptibility, although this sequences particularly motion limited. No hydrocephalus.  No mass, mass effect, or midline shift. Vascular: Please see MRA findings below. Skull and upper cervical spine: Grossly normal marrow signal. Sinuses/Orbits: No acute finding. Other: None. MRA HEAD FINDINGS Anterior circulation: Both internal carotid arteries are patent to the termini, without significant stenosis. Patent right A1. Aplastic left A1. Normal anterior communicating artery. Anterior cerebral arteries are patent to their distal aspects. No M1 stenosis or occlusion. Normal MCA bifurcations. Distal MCA branches perfused and symmetric. Posterior circulation: Vertebral arteries patent to the vertebrobasilar junction without stenosis. Posterior inferior cerebral arteries patent bilaterally. Basilar patent to its distal aspect. Superior cerebellar arteries patent bilaterally. Patent P1 segments. Occlusion of the distal right P1/proximal right P2 (series 4, images 96-102), without evidence of distal reconstitution. The left PCA and its branches are perfused to their distal aspects without stenosis. The bilateral posterior communicating arteries are not visualized. Anatomic variants: None significant IMPRESSION: 1. Evaluation is somewhat limited by early termination of the study by the patient, with limited sequences obtained. The obtained series are also limited by motion. 2. Occlusion of the distal right P1/proximal right P2 segment, without evidence of reconstitution of the right PCA branches. 3. Mildly increased signal on diffusion-weighted images in the right occipital and posterior temporal lobes, favored  to represent subacute infarcts, with possible susceptibility from petechial or subarachnoid hemorrhage. 4. No other intracranial large vessel occlusion or significant stenosis. These results will be called to the ordering clinician or representative by the Radiologist Assistant, and communication documented in the PACS or [employer] Dashboard. Electronically Signed   By: Wiliam KeAlison  Vasan M.D.   On: 09/19/2022 21:42   MR ANGIO HEAD WO CONTRAST  Result Date: 09/19/2022 CLINICAL DATA:  Stroke suspected EXAM: MRI HEAD WITHOUT CONTRAST MRA HEAD WITHOUT CONTRAST TECHNIQUE: Multiplanar, multi-echo pulse sequences of the brain and surrounding structures were acquired without intravenous contrast. Angiographic images of the Circle of Willis were acquired using MRA technique without intravenous contrast. COMPARISON:  09/18/2022 CT head, 04/06/2012 MRI head FINDINGS: Evaluation is somewhat limited by early termination of the study by the patient. Only diffusion-weighted imaging, sagittal T1 sequence, and axial T2, FLAIR, and susceptibility weighted sequences were obtained. The obtained series are also limited by motion. MRI HEAD FINDINGS Brain: Mildly increased signal on diffusion-weighted imaging with ADC correlate in the right occipital lobe and posterior temporal lobe (series 2, images 21-33). This is associated with increased T2 hyperintense signal but no definite gyral swelling. Some of these areas may be associated with susceptibility, although this sequences particularly motion limited. No hydrocephalus.  No mass, mass effect, or  midline shift. Vascular: Please see MRA findings below. Skull and upper cervical spine: Grossly normal marrow signal. Sinuses/Orbits: No acute finding. Other: None. MRA HEAD FINDINGS Anterior circulation: Both internal carotid arteries are patent to the termini, without significant stenosis. Patent right A1. Aplastic left A1. Normal anterior communicating artery. Anterior cerebral arteries are  patent to their distal aspects. No M1 stenosis or occlusion. Normal MCA bifurcations. Distal MCA branches perfused and symmetric. Posterior circulation: Vertebral arteries patent to the vertebrobasilar junction without stenosis. Posterior inferior cerebral arteries patent bilaterally. Basilar patent to its distal aspect. Superior cerebellar arteries patent bilaterally. Patent P1 segments. Occlusion of the distal right P1/proximal right P2 (series 4, images 96-102), without evidence of distal reconstitution. The left PCA and its branches are perfused to their distal aspects without stenosis. The bilateral posterior communicating arteries are not visualized. Anatomic variants: None significant IMPRESSION: 1. Evaluation is somewhat limited by early termination of the study by the patient, with limited sequences obtained. The obtained series are also limited by motion. 2. Occlusion of the distal right P1/proximal right P2 segment, without evidence of reconstitution of the right PCA branches. 3. Mildly increased signal on diffusion-weighted images in the right occipital and posterior temporal lobes, favored to represent subacute infarcts, with possible susceptibility from petechial or subarachnoid hemorrhage. 4. No other intracranial large vessel occlusion or significant stenosis. These results will be called to the ordering clinician or representative by the Radiologist Assistant, and communication documented in the PACS or [employer]. Electronically Signed   By: Wiliam Ke M.D.   On: 09/19/2022 21:42   ECHOCARDIOGRAM COMPLETE  Result Date: 09/19/2022    ECHOCARDIOGRAM REPORT   Patient Name:   IFEANYICHUKWU WICKHAM Date of Exam: 09/19/2022 Medical Rec #:  [id number]    Height:       67.0 in Accession #:    [id number]   Weight:       247.1 lb Date of Birth:  1960-08-10   BSA:          2.213 m Patient Age:    61 years     BP:           157/87 mmHg Patient Gender: M            HR:           77 bpm. Exam Location:   Inpatient Procedure: 2D Echo, Cardiac Doppler and Color Doppler Indications:    Stroke  History:        Patient has prior history of Echocardiogram examinations, most                 recent 01/25/2022. COPD; Risk Factors:Hypertension and Diabetes.                 CKD, chronic hypoxic respiratory failure.  Sonographer:    Milda Smart Referring Phys: [id number] ANASTASSIA DOUTOVA  Sonographer Comments: Technically difficult study due to poor echo windows. Image acquisition challenging due to patient body habitus and Image acquisition challenging due to respiratory motion. IMPRESSIONS  1. Left ventricular ejection fraction, by estimation, is 55 to 60%. The left ventricle has normal function. The left ventricle has no regional wall motion abnormalities. The left ventricular internal cavity size was mildly dilated. Left ventricular diastolic parameters are consistent with Grade I diastolic dysfunction (impaired relaxation).  2. Right ventricular systolic function is normal. The right ventricular size is normal. Tricuspid regurgitation signal is inadequate for assessing PA pressure.  3. Left atrial size was mildly dilated.  4. The mitral valve is normal in structure. No evidence of mitral valve regurgitation.  5. The aortic valve is tricuspid. There is mild calcification of the aortic valve. Aortic valve regurgitation is mild to moderate. Aortic valve sclerosis/calcification is present, without any evidence of aortic stenosis.  6. The inferior vena cava is normal in size with greater than 50% respiratory variability, suggesting right atrial pressure of 3 mmHg. FINDINGS  Left Ventricle: Left ventricular ejection fraction, by estimation, is 55 to 60%. The left ventricle has normal function. The left ventricle has no regional wall motion abnormalities. The left ventricular internal cavity size was mildly dilated. There is  no left ventricular hypertrophy. Left ventricular diastolic parameters are consistent with Grade I  diastolic dysfunction (impaired relaxation). Normal left ventricular filling pressure. Right Ventricle: The right ventricular size is normal. No increase in right ventricular wall thickness. Right ventricular systolic function is normal. Tricuspid regurgitation signal is inadequate for assessing PA pressure. Left Atrium: Left atrial size was mildly dilated. Right Atrium: Right atrial size was normal in size. Pericardium: There is no evidence of pericardial effusion. Mitral Valve: The mitral valve is normal in structure. Mild mitral annular calcification. No evidence of mitral valve regurgitation. Tricuspid Valve: The tricuspid valve is normal in structure. Tricuspid valve regurgitation is not demonstrated. Aortic Valve: The aortic valve is tricuspid. There is mild calcification of the aortic valve. Aortic valve regurgitation is mild to moderate. Aortic regurgitation PHT measures 570 msec. Aortic valve sclerosis/calcification is present, without any evidence of aortic stenosis. Aortic valve mean gradient measures 9.0 mmHg. Aortic valve peak gradient measures 20.0 mmHg. Aortic valve area, by VTI measures 3.48 cm. Pulmonic Valve: The pulmonic valve was not well visualized. Pulmonic valve regurgitation is not visualized. No evidence of pulmonic stenosis. Aorta: The aortic root is normal in size and structure. Venous: The inferior vena cava is normal in size with greater than 50% respiratory variability, suggesting right atrial pressure of 3 mmHg. IAS/Shunts: No atrial level shunt detected by color flow Doppler.  LEFT VENTRICLE PLAX 2D LVIDd:         5.70 cm      Diastology LVIDs:         3.90 cm      LV e' medial:    6.31 cm/s LV PW:         0.90 cm      LV E/e' medial:  10.3 LV IVS:        0.80 cm      LV e' lateral:   6.64 cm/s LVOT diam:     2.17 cm      LV E/e' lateral: 9.8 LV SV:         129 LV SV Index:   58 LVOT Area:     3.70 cm  LV Volumes (MOD) LV vol d, MOD A2C: 147.0 ml LV vol d, MOD A4C: 147.0 ml LV vol  s, MOD A2C: 55.7 ml LV vol s, MOD A4C: 69.2 ml LV SV MOD A2C:     91.3 ml LV SV MOD A4C:     147.0 ml LV SV MOD BP:      87.0 ml RIGHT VENTRICLE             IVC RV S prime:     13.40 cm/s  IVC diam: 1.20 cm TAPSE (M-mode): 2.5 cm LEFT ATRIUM             Index        RIGHT ATRIUM  Index LA diam:        4.30 cm 1.94 cm/m   RA Area:     14.80 cm LA Vol (A2C):   61.2 ml 27.66 ml/m  RA Volume:   31.20 ml  14.10 ml/m LA Vol (A4C):   51.4 ml 23.23 ml/m LA Biplane Vol: 57.7 ml 26.08 ml/m  AORTIC VALVE AV Area (Vmax):    2.79 cm AV Area (Vmean):   2.96 cm AV Area (VTI):     3.48 cm AV Vmax:           223.81 cm/s AV Vmean:          138.768 cm/s AV VTI:            0.370 m AV Peak Grad:      20.0 mmHg AV Mean Grad:      9.0 mmHg LVOT Vmax:         169.00 cm/s LVOT Vmean:        111.000 cm/s LVOT VTI:          0.348 m LVOT/AV VTI ratio: 0.94 AI PHT:            570 msec  AORTA Ao Root diam: 3.30 cm Ao Asc diam:  3.60 cm MITRAL VALVE MV Area (PHT): 2.35 cm    SHUNTS MV Decel Time: 323 msec    Systemic VTI:  0.35 m MV E velocity: 65.20 cm/s  Systemic Diam: 2.17 cm MV A velocity: 93.45 cm/s MV E/A ratio:  0.70 Mihai Croitoru MD Electronically signed by Thurmon Fair MD Signature Date/Time: 09/19/2022/11:15:35 AM    Final    DG Foot 2 Views Left  Result Date: 09/18/2022 CLINICAL DATA:  Diabetic ulcer of left ankle EXAM: LEFT FOOT - 2 VIEW; LEFT TIBIA AND FIBULA - 2 VIEW COMPARISON:  09/05/2022 FINDINGS: Left tibia/fibula: Frontal and lateral views are obtained. Bones are osteopenic. There are no acute fractures. No cortical destruction or periosteal reaction to suggest osteomyelitis. There is diffuse subcutaneous edema, greatest at the ankle. No subcutaneous gas or radiopaque foreign body. Left foot: Frontal and lateral views of the left foot are obtained. Prior first and second digit transmetatarsal amputation. Prior third digit amputation at the third middle phalanx. There is diffuse soft tissue swelling of the  left foot and ankle. The bones are osteopenic. No bony destruction or periosteal reaction to suggest osteomyelitis. No evidence of acute fracture. IMPRESSION: 1. Extensive soft tissue swelling throughout the left lower leg, greatest at the foot and ankle. 2. Diffuse osteopenia. No acute or destructive bony abnormalities. No radiographic evidence of osteomyelitis. Electronically Signed   By: Sharlet Salina M.D.   On: 09/18/2022 20:58   DG Tibia/Fibula Left  Result Date: 09/18/2022 CLINICAL DATA:  Diabetic ulcer of left ankle EXAM: LEFT FOOT - 2 VIEW; LEFT TIBIA AND FIBULA - 2 VIEW COMPARISON:  09/05/2022 FINDINGS: Left tibia/fibula: Frontal and lateral views are obtained. Bones are osteopenic. There are no acute fractures. No cortical destruction or periosteal reaction to suggest osteomyelitis. There is diffuse subcutaneous edema, greatest at the ankle. No subcutaneous gas or radiopaque foreign body. Left foot: Frontal and lateral views of the left foot are obtained. Prior first and second digit transmetatarsal amputation. Prior third digit amputation at the third middle phalanx. There is diffuse soft tissue swelling of the left foot and ankle. The bones are osteopenic. No bony destruction or periosteal reaction to suggest osteomyelitis. No evidence of acute fracture. IMPRESSION: 1. Extensive soft tissue swelling throughout the  left lower leg, greatest at the foot and ankle. 2. Diffuse osteopenia. No acute or destructive bony abnormalities. No radiographic evidence of osteomyelitis. Electronically Signed   By: Sharlet Salina M.D.   On: 09/18/2022 20:58   DG CHEST PORT 1 VIEW  Result Date: 09/18/2022 CLINICAL DATA:  Stroke. EXAM: PORTABLE CHEST 1 VIEW COMPARISON:  Chest radiograph 09/13/2022 FINDINGS: The cardiomediastinal silhouette is unchanged. The interstitial markings are increased bilaterally. No lobar consolidation, sizeable pleural effusion, or pneumothorax is identified. No acute osseous abnormality is  seen. IMPRESSION: Mild bilateral interstitial densities which could reflect early edema or atypical infection. Electronically Signed   By: Sebastian Ache M.D.   On: 09/18/2022 20:25   CT Head Wo Contrast  Result Date: 09/18/2022 CLINICAL DATA:  Neurologic deficit, history of melanoma EXAM: CT HEAD WITHOUT CONTRAST TECHNIQUE: Contiguous axial images were obtained from the base of the skull through the vertex without intravenous contrast. RADIATION DOSE REDUCTION: This exam was performed according to the departmental dose-optimization program which includes automated exposure control, adjustment of the mA and/or kV according to patient size and/or use of iterative reconstruction technique. COMPARISON:  01/24/2022 FINDINGS: Brain: Hypodensities within the right occipital cortex and subcortical white matter are seen, new since prior study, consistent with subacute to chronic ischemic changes. No evidence of acute infarct or hemorrhage. Lateral ventricles and midline structures are unremarkable. There are no acute extra-axial fluid collections. No mass effect. Vascular: Atherosclerosis of the left vertebral artery and internal carotid arteries. No hyperdense vessel. Skull: Normal. Negative for fracture or focal lesion. Sinuses/Orbits: No acute finding. Stable postsurgical changes within the right infraorbital soft tissues consistent with prior melanoma resection. The associated soft tissue edema seen on prior exam has resolved in the interim. Other: Note is made of marked fatty atrophy of the tongue, asymmetric on the left, new since prior study. IMPRESSION: 1. New hypodensities within the right occipital cortex and subcortical white matter, with an appearance most consistent with subacute to chronic ischemic changes. 2. No evidence of acute infarct or hemorrhage. 3. Interval development of marked fatty atrophy of the tongue, left greater than right. 4. Postsurgical changes of the right cheek related to prior melanoma  resection. Electronically Signed   By: Sharlet Salina M.D.   On: 09/18/2022 17:33   DG Chest Port 1 View  Result Date: 09/13/2022 CLINICAL DATA:  Cough. EXAM: PORTABLE CHEST 1 VIEW COMPARISON:  September 05, 2022. FINDINGS: Stable cardiomediastinal silhouette. Both lungs are clear. The visualized skeletal structures are unremarkable. IMPRESSION: No active disease. Electronically Signed   By: Lupita Raider M.D.   On: 09/13/2022 08:59   DG Tibia/Fibula Left  Result Date: 09/05/2022 CLINICAL DATA:  Left lower extremity swelling EXAM: LEFT TIBIA AND FIBULA - 2 VIEW; LEFT FOOT - COMPLETE 3 VIEW COMPARISON:  08/22/2022 FINDINGS: Left foot: Severe soft tissue swelling about the foot. No obvious soft tissue gas. There has been amputation of the phalanges of the first and second digit as well as the distal aspect of the first and second metatarsals. Residual sesamoid bone of the first ray. No obvious acute fracture or dislocation. No definite separate erosive changes. Views are oblique, limiting evaluation. In addition the extreme distal tip of the distal phalanx of fourth digit is clipped off the edge of the film. There is also been amputation of the distal phalanx and distal aspect of the middle phalanx of third digit. Osteopenia. Stable erosive area involving the head of the third metatarsal. Tibia and fibula: Extensive  soft tissue swelling particularly towards the ankle. No underlying fracture or dislocation. Osteopenia. Preserved adjacent joint spaces except for mild joint space loss of the medial compartment of the knee. IMPRESSION: Severe soft tissue swelling particularly of the ankle and foot. No obvious fracture or dislocation. Evaluation of the foot is somewhat limited due to positioning and technique. Surgical changes involving amputation of the phalanges of the first and second digit as well as the distal aspect of the third digit. Is also amputation of the distal aspect of the first and second metatarsals.  Stable erosive changes involving the head of third metatarsal. If there is further concern of bone infection, additional evaluation with MRI or bone scan may be useful to further delineate. Electronically Signed   By: Karen Kays M.D.   On: 09/05/2022 18:41   DG Foot Complete Left  Result Date: 09/05/2022 CLINICAL DATA:  Left lower extremity swelling EXAM: LEFT TIBIA AND FIBULA - 2 VIEW; LEFT FOOT - COMPLETE 3 VIEW COMPARISON:  08/22/2022 FINDINGS: Left foot: Severe soft tissue swelling about the foot. No obvious soft tissue gas. There has been amputation of the phalanges of the first and second digit as well as the distal aspect of the first and second metatarsals. Residual sesamoid bone of the first ray. No obvious acute fracture or dislocation. No definite separate erosive changes. Views are oblique, limiting evaluation. In addition the extreme distal tip of the distal phalanx of fourth digit is clipped off the edge of the film. There is also been amputation of the distal phalanx and distal aspect of the middle phalanx of third digit. Osteopenia. Stable erosive area involving the head of the third metatarsal. Tibia and fibula: Extensive soft tissue swelling particularly towards the ankle. No underlying fracture or dislocation. Osteopenia. Preserved adjacent joint spaces except for mild joint space loss of the medial compartment of the knee. IMPRESSION: Severe soft tissue swelling particularly of the ankle and foot. No obvious fracture or dislocation. Evaluation of the foot is somewhat limited due to positioning and technique. Surgical changes involving amputation of the phalanges of the first and second digit as well as the distal aspect of the third digit. Is also amputation of the distal aspect of the first and second metatarsals. Stable erosive changes involving the head of third metatarsal. If there is further concern of bone infection, additional evaluation with MRI or bone scan may be useful to further  delineate. Electronically Signed   By: Karen Kays M.D.   On: 09/05/2022 18:41   DG Chest 2 View  Result Date: 09/05/2022 CLINICAL DATA:  Shortness of breath EXAM: CHEST - 2 VIEW COMPARISON:  08/22/2022 FINDINGS: Heart size upper limits of normal. Mediastinal shadows are normal. There may be pulmonary venous hypertension but there is no frank edema. No consolidation or collapse. No effusion. IMPRESSION: Question pulmonary venous hypertension. No frank edema. No consolidation or collapse. Electronically Signed   By: Paulina Fusi M.D.   On: 09/05/2022 15:59   VAS [place] LOWER EXTREMITY VENOUS (DVT)  Result Date: 08/23/2022  Lower Venous DVT Study Patient Name:  RACHIT GRIM  Date of Exam:   08/23/2022 Medical Rec #: [id number]     Accession #:    [id number] Date of Birth: [birth year]    Patient Gender: M Patient Age:   43 years Exam Location:  [facility] Procedure:      VAS [place] LOWER EXTREMITY VENOUS (DVT) Referring Phys: Mikey College --------------------------------------------------------------------------------  Indications: Pain, Swelling, and ulceration.  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 Comparison Study: Prior negative left LEV done 07/05/21 Performing Technologist: Sherren Kerns RVS  Examination Guidelines: A complete evaluation includes B-mode imaging, spectral Doppler, color Doppler, and power Doppler as needed of all accessible portions of each vessel. Bilateral testing is considered an integral part of a complete examination. Limited examinations for reoccurring indications may be performed as noted. The reflux portion of the exam is performed with the patient in reverse Trendelenburg.  +-------+---------------+---------+-----------+----------+--------------+ RIGHT  CompressibilityPhasicitySpontaneityPropertiesThrombus Aging  +-------+---------------+---------+-----------+----------+--------------+ CFV    Full           Yes      Yes                                 +-------+---------------+---------+-----------+----------+--------------+ FV ProxFull                                                        +-------+---------------+---------+-----------+----------+--------------+ PFV    Full                                                        +-------+---------------+---------+-----------+----------+--------------+   +---------+---------------+---------+-----------+----------+-------------------+ LEFT     CompressibilityPhasicitySpontaneityPropertiesThrombus Aging      +---------+---------------+---------+-----------+----------+-------------------+ CFV      Full           Yes      Yes                                      +---------+---------------+---------+-----------+----------+-------------------+ SFJ      Full                                                             +---------+---------------+---------+-----------+----------+-------------------+ FV Prox  Full                                                             +---------+---------------+---------+-----------+----------+-------------------+ FV Mid   Full                                                             +---------+---------------+---------+-----------+----------+-------------------+ FV DistalFull                                                             +---------+---------------+---------+-----------+----------+-------------------+  PFV      Full                                                             +---------+---------------+---------+-----------+----------+-------------------+ POP      Full           Yes      Yes                                      +---------+---------------+---------+-----------+----------+-------------------+ PTV                                                    Not well visualized +---------+---------------+---------+-----------+----------+-------------------+ PERO                                                  Not well visualized +---------+---------------+---------+-----------+----------+-------------------+     Summary: RIGHT: - No evidence of common femoral vein obstruction.  LEFT: - There is no evidence of deep vein thrombosis in the lower extremity. However, portions of this examination were limited- see technologist comments above.  - Attempted left ABI/duplex of left PTA, ATA. However, skin/tissue properties prohibit evaluation of the vessels of the calf/ankle - Ultrasound characteristics of enlarged, reactive lymph nodes noted in the groin.  *See table(s) above for measurements and observations. Electronically signed by Gerarda Fraction on 08/23/2022 at 4:21:00 PM.    Final    MR FOOT LEFT WO CONTRAST  Result Date: 08/23/2022 CLINICAL DATA:  Osteomyelitis, foot EXAM: MRI OF THE LEFT FOOT WITHOUT CONTRAST TECHNIQUE: Multiplanar, multisequence MR imaging of the left forefoot was performed. No intravenous contrast was administered. COMPARISON:  Left radiograph 08/22/2022 FINDINGS: Bones/Joint/Cartilage Prior partial first and second ray amputations. Prior partial third toe amputation. There is focal blunting of the fourth toe distal phalangeal tuft without marrow edema, likely chronic. There is chronic erosive changes at the third metatarsal head and arthritis of the third MTP joint. There is no significant associated marrow edema and there is preserved T1 marrow signal. Ligaments Intact Lisfranc ligament. Muscles and Tendons Chronic denervation changes in the foot. Soft tissues Severe diffuse skin thickening and soft tissue swelling of the foot. There is no organized fluid collection on noncontrast MRI. IMPRESSION: Severe diffuse skin thickening and soft tissue swelling of the foot, as can be seen cellulitis. No evidence soft tissue abscess on  noncontrast MRI. Chronic erosive change at the third metatarsal head and arthritis of the third MTP joint. Focal blunting of the fourth toe distal phalangeal tuft without marrow edema, also likely chronic. No evidence of acute osteomyelitis. Electronically Signed   By: Caprice Renshaw M.D.   On: 08/23/2022 11:43   DG Foot 2 Views Left  Result Date: 08/22/2022 CLINICAL DATA:  Cellulitis EXAM: LEFT FOOT - 2 VIEW COMPARISON:  Radiograph 06/03/2018 FINDINGS: Severe soft tissue swelling of the left foot and ankle. Prior partial first and second ray amputations. Prior partial third toe amputation. Osseous  lucency in the third metatarsal head and articular surface irregularity of the third MTP joint. New blunting and cortical irregularity of the fourth toe distal phalangeal tuft. Focal lucency in the posterior calcaneus. IMPRESSION: Severe soft tissue swelling of the left foot and ankle. Osseous lucency in the third metatarsal head and articular surface irregularity of the third MTP joint, and new blunting and cortical irregularity of the fourth toe distal phalangeal tuft, suspicious for osteomyelitis. Focal osseous lucency in the posterior calcaneus, correlate with presence of adjacent ulcer in the heel. Electronically Signed   By: Caprice Renshaw M.D.   On: 08/22/2022 15:54   DG Tibia/Fibula Left  Result Date: 08/22/2022 CLINICAL DATA:  Cellulitis EXAM: LEFT TIBIA AND FIBULA - 2 VIEW COMPARISON:  None Available. FINDINGS: No acute osseous abnormality involving the left tibia or fibula. Alignment is normal. There is no bone erosion or frank bony destruction. There is severe soft tissue swelling of the left lower leg. IMPRESSION: Severe soft tissue swelling of the left lower leg. No acute osseous abnormality involving the left tibia or fibula. Electronically Signed   By: Caprice Renshaw M.D.   On: 08/22/2022 15:48    Microbiology: Results for orders placed or performed during the hospital encounter of 09/18/22  MRSA Next Gen  by PCR, Nasal     Status: None   Collection Time: 09/18/22  8:05 PM   Specimen: Nasal Mucosa; Nasal Swab  Result Value Ref Range Status   MRSA by PCR Next Gen NOT DETECTED NOT DETECTED Final    Comment: (NOTE) The GeneXpert MRSA Assay (FDA approved for NASAL specimens only), is one component of a comprehensive MRSA colonization surveillance program. It is not intended to diagnose MRSA infection nor to guide or monitor treatment for MRSA infections. Test performance is not FDA approved in patients less than 62 years old. Performed at [facility], [address], [place], [place] [address]     Labs: CBC: Recent Labs  Lab 09/18/22 1701  WBC 6.8  NEUTROABS 4.2  HGB 10.5*  HCT 34.7*  MCV 87.0  PLT 448*   Basic Metabolic Panel: Recent Labs  Lab 09/18/22 1701  NA 134*  K 4.6  CL 104  CO2 22  GLUCOSE 203*  BUN 25*  CREATININE 2.10*  CALCIUM 8.9   Liver Function Tests: Recent Labs  Lab 09/18/22 1701  AST 24  ALT 10  ALKPHOS 63  BILITOT 0.7  PROT 7.4  ALBUMIN 3.1*   CBG: Recent Labs  Lab 09/20/22 1656 09/20/22 2014 09/20/22 2343 09/21/22 0446 09/21/22 0913  GLUCAP 230* 149* 138* 134* 149*    Discharge time spent: less than 30 minutes.  Signed: Rickey Barbara, MD [facility] 09/21/2022

## 2022-09-21 NOTE — TOC Transition Note (Signed)
Transition of Care [facility]) - CM/SW Discharge Note   Patient Details  Name: Gregory [name] MRN: [id number] Date of Birth: 09-Jul-1960  [facility]) CM/SW Contact:  Baldemar Lenis, LCSW Phone Number: 09/21/2022, 1:41 PM   Clinical Narrative:   CSW met with patient to discuss return to [facility], and patient saying he wants to go elsewhere. CSW warned patient there were no other places in [place], and patient said he wanted to go elsewhere or maybe home with his daughter. CSW spoke with daughter, [name], who agreed that she would like her father somewhere else but that she couldn't take care of him. CSW sent out referrals, found beds at [facility] or [facility]. CSW provided patient with offers and he did not want to go that far, agreeable to return to [facility] but would like transport set up for after his daughter visits. CSW updated RN and MD.  CSW sent discharge to [facility] and confirmed bed availability. Transport to be arranged for later this afternoon with [facility].   Nurse to call report to [phone].    Final next level of care: Long Term Nursing Home Barriers to Discharge: Barriers Resolved   Patient Goals and CMS Choice CMS Medicare.gov Compare Post Acute Care list provided to:: Patient Choice offered to / list presented to : Patient  Discharge Placement                Patient chooses bed at:  [facility]) Patient to be transferred to facility by: [facility] Name of family member notified: Patient Patient and family notified of of transfer: 09/21/22  Discharge Plan and Services Additional resources added to the After Visit Summary for       Post Acute Care Choice: Skilled Nursing Facility                               Social Determinants of Health (SDOH) Interventions SDOH Screenings   Food Insecurity: No Food Insecurity (09/18/2022)  Housing: Low Risk  (09/18/2022)  Transportation Needs: No Transportation Needs (09/18/2022)   Utilities: Not At Risk (09/18/2022)  Depression (PHQ2-9): Low Risk  (03/08/2021)  Tobacco Use: High Risk (09/18/2022)     Readmission Risk Interventions    09/06/2022    1:48 PM 08/27/2022   12:46 PM 05/03/2022   11:53 AM  Readmission Risk Prevention Plan  Transportation Screening Complete Complete Complete  Medication Review Oceanographer) Complete Complete Complete  PCP or Specialist appointment within 3-5 days of discharge Complete Complete Complete  HRI or Home Care Consult Complete Complete Complete  SW Recovery Care/Counseling Consult Complete Complete Complete  Palliative Care Screening Not Applicable Not Applicable Not Applicable  Skilled Nursing Facility Complete Complete Complete

## 2022-09-21 NOTE — Progress Notes (Signed)
SLP Cancellation Note  Patient Details Name: Gregory [name] MRN: [id number] DOB: 1960-10-29   Cancelled treatment:        Attempted to see pt for cognitive linguistic assessment.  Pt c/o nausea.  Assisted pt in requesting medication with use of call bell.  Pt refused assessment.  States he does not want to participate right now because he feels sick.  Pt declined repositioning in bed and again declined SLE when encouraged by nurse.  Pt became agitated with questioning about what time might be better for him.  SLP will reattempt x1 at a later date.   Kerrie Pleasure, MA, CCC-SLP Acute Rehabilitation Services Office: [phone] 09/21/2022, 10:39 AM

## 2022-09-21 NOTE — Significant Event (Signed)
Report attempted at [facility]. Placed on call for more than 15 minutes.

## 2022-09-21 NOTE — Progress Notes (Signed)
Removed IV and external cath. Report given to [facility] technician. Attempted to call [employer] x3 and received answering machine. Voicemail left.

## 2022-09-21 NOTE — Significant Event (Signed)
RN attempted calling report several times to [facility]; calls went straight to voicemail and RN press "star" for operator as prompted but not able to get through. RN also left voice message and call back number.   RN updated patient and daughter [name]. Awaiting on [facility] to pick patient up.

## 2022-09-24 NOTE — Progress Notes (Signed)
[facility] - Clinic Note  10/03/2022     CHIEF COMPLAINT Patient presents for Retina Evaluation   HISTORY OF PRESENT ILLNESS: Gregory [name] is a 62 y.o. male who presents to the clinic today for:  HPI     Retina Evaluation   In both eyes.  This started 1 month ago.  Duration of 1 month.  Context:  distance vision, mid-range vision and near vision.  I, the attending physician,  performed the HPI with the patient and updated documentation appropriately.        Comments   Retina eval for diabetic eye exam PT states he saw Dr. 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 Dione Booze, but has not been there in awhile  Referring physician: Olivia Canter, MD [address] [place],  [place] [address]  HISTORICAL INFORMATION:  Selected notes from the MEDICAL RECORD NUMBER Referred by Terri Piedra, NP at [facility] Pt follows with [facility] LEE: 08/09/22 at [facility] [[facility] OD 20/25, OS NLP] Ocular Hx- retinal/ophthalmic ischemia -- long standing low vision OS that went NLP in Feb 2024 PMH-    CURRENT MEDICATIONS: Current Outpatient Medications (Ophthalmic Drugs)  Medication Sig   Dextran 70-Hypromellose (NATURAL BALANCE TEARS) 0.1-0.3 % SOLN Apply 1 drop to eye every 4 (four) hours as needed (dry eyes).   Polyethyl Glycol-Propyl Glycol (LUBRICANT EYE DROPS) 0.4-0.3 % SOLN Place 2 drops into both eyes every 4 (four) hours as  needed (dryness/itching).   No current facility-administered medications for this visit. (Ophthalmic Drugs)   Current Outpatient Medications (Other)  Medication Sig   Amino Acids-Protein Hydrolys (PRO-STAT) LIQD Take 30 mLs by mouth in the morning.   aspirin EC 81 MG tablet Take 1 tablet (81 mg total) by mouth daily. Swallow whole.   atorvastatin (LIPITOR) 80 MG tablet Take 1 tablet (80 mg total) by mouth daily.   cetirizine (ZYRTEC) 10 MG tablet Take 10 mg by mouth daily.   cyanocobalamin 1000 MCG tablet Take 1,000 mcg by mouth daily.   diclofenac Sodium (VOLTAREN) 1 % GEL Apply 4 g topically See admin instructions. Apply to right leg topically every 6 hours as needed for pain   docusate sodium (COLACE) 100 MG capsule Take 1 capsule (100 mg total) by mouth 2 (two) times daily.   fenofibrate 54 MG tablet Take 1 tablet (54 mg total) by mouth daily.   ferrous sulfate 325 (65 FE) MG tablet Take 1 tablet (325 mg total) by mouth daily with breakfast.   fluticasone (FLONASE) 50 MCG/ACT nasal spray Place 1 spray into both nostrils in the morning.   furosemide (LASIX) 40 MG tablet Take 1 tablet (40 mg total) by mouth daily.   gabapentin (NEURONTIN) 300 MG capsule Take 1 capsule (300 mg total) by  mouth 2 (two) times daily.   Glucagon, rDNA, (GLUCAGON EMERGENCY) 1 MG KIT Inject 1 mg into the vein as needed (for hypolycemia).   hydrOXYzine (ATARAX) 25 MG tablet Take 25 mg by mouth every 8 (eight) hours as needed for anxiety.   hydrOXYzine (VISTARIL) 50 MG capsule Take 50 mg by mouth at bedtime.   insulin glargine (LANTUS SOLOSTAR) 100 UNIT/ML Solostar Pen Inject 15 Units into the skin daily. (Patient taking differently: Inject 15 Units into the skin at bedtime.)   insulin lispro (HUMALOG) 100 UNIT/ML KwikPen Inject 0-10 Units into the skin See admin instructions. Inject 0-10 units into the skin three times a day and at bedtime, PER SLIDING SCALE:  BGL 151-200 = 2 units 201-250 = 4 units 251-300 = 6  units 301-350 =  8 units 351-400 = 10 units (Patient taking differently: Inject 0-10 Units into the skin See admin instructions. Inject 0-10 units into the skin before meals and at bedtime, PER SLIDING SCALE:  BGL 151-200 = 2 units 201-250 = 4 units 251-300 = 6 units 301-350 =  8 units 351-400 = 10 units)   ipratropium-albuterol (DUONEB) 0.5-2.5 (3) MG/3ML SOLN Take 3 mLs by nebulization every 6 (six) hours as needed (for shortness of breath).   lactulose (CHRONULAC) 10 GM/15ML solution Take 15 mLs (10 g total) by mouth 3 (three) times daily as needed for moderate constipation. (Patient taking differently: Take 10 g by mouth every 8 (eight) hours as needed for moderate constipation.)   Lidocaine 4 % PTCH Apply 1 patch topically See admin instructions. Apply 1 patch to both the right shoulder and the right stump at 9 AM daily- remove at 9 PM   magnesium oxide (MAG-OX) 400 MG tablet Take 400 mg by mouth 2 (two) times daily.   melatonin 3 MG TABS tablet Take 3 mg by mouth at bedtime.   metoprolol tartrate (LOPRESSOR) 25 MG tablet Take 0.5 tablets (12.5 mg total) by mouth 2 (two) times daily.   Multiple Vitamins-Minerals (MULTIVITAMIN WITH MINERALS) tablet Take 1 tablet by mouth daily.   Multiple Vitamins-Minerals (PRESERVISION AREDS 2) CAPS Take 1 capsule by mouth in the morning and at bedtime.   naloxone (NARCAN) 4 MG/0.1ML LIQD nasal spray kit Place 1 spray into the nose as needed (poorly responsive or turning blue).   omeprazole (PRILOSEC) 20 MG capsule Take 20 mg by mouth at bedtime.   ondansetron (ZOFRAN) 4 MG tablet Take 4 mg by mouth every 8 (eight) hours as needed for nausea.   Oxycodone HCl 20 MG TABS Take 1 tablet (20 mg total) by mouth every 8 (eight) hours as needed (severe pain).   OXYGEN Inhale 2 L/min into the lungs as needed (for shortness of breath- to maintain sats >90%).   oxymetazoline (AFRIN 12 HOUR) 0.05 % nasal spray Place 1 spray into both nostrils every 12 (twelve) hours as  needed for congestion.   polyethylene glycol (MIRALAX / GLYCOLAX) 17 g packet Take 17 g by mouth daily as needed for mild constipation.   prochlorperazine (COMPAZINE) 10 MG tablet Take 10 mg by mouth every 6 (six) hours as needed for nausea or vomiting.   sennosides-docusate sodium (SENOKOT-S) 8.6-50 MG tablet Take 1 tablet by mouth at bedtime.   tamsulosin (FLOMAX) 0.4 MG CAPS capsule Take 0.4 mg by mouth daily.   tiotropium (SPIRIVA) 18 MCG inhalation capsule Place 18 mcg into inhaler and inhale daily.   TRULICITY 0.75 MG/0.5ML SOPN Inject 0.75 mg into the skin every Wednesday.  No current facility-administered medications for this visit. (Other)   REVIEW OF SYSTEMS: ROS   Positive for: Endocrine, Eyes, Allergic/Imm Last edited by Etheleen Mayhew, COT on 10/03/2022  9:03 AM.     ALLERGIES Allergies  Allergen Reactions   Latex Other (See Comments)    "ALLERGIC," per [facility]   PAST MEDICAL HISTORY Past Medical History:  Diagnosis Date   Acquired absence of left great toe    Acquired absence of right leg below knee    Acute osteomyelitis of calcaneum, right 10/02/2016   Acute respiratory failure    Amputation stump infection 08/12/2018   Anemia    Basal cell carcinoma, eyelid    Cancer    Candidiasis 08/12/2018   CHF (congestive heart failure)    chronic diastolic    Chronic back pain    Chronic kidney disease    stage 3    Chronic multifocal osteomyelitis    of ankle and foot    Chronic pain syndrome    COPD (chronic obstructive pulmonary disease)    DDD (degenerative disc disease), lumbar    Depression    major depressive disorder    Diabetes mellitus without complication    type 2    Diabetic ulcer of toe of left foot 10/02/2016   Difficult intubation    Difficulty in walking, not elsewhere classified    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   Foot drop, bilateral    Foot drop, right 12/27/2015   GERD (gastroesophageal reflux disease)    Herpesviral vesicular  dermatitis    History of COVID-19 03/15/2022   Hyperlipidemia    Hyperlipidemia    Hypertension    Insomnia    Malignant melanoma of other parts of face    Melanoma    MRSA bacteremia    Muscle weakness (generalized)    Nasal congestion    Necrosis of toe 07/08/2018   Neuromuscular dysfunction of bladder    Osteomyelitis    Osteomyelitis    Osteomyelitis    right BKA   Other idiopathic peripheral autonomic neuropathy    Retention of urine, unspecified    Squamous cell carcinoma of skin    Unsteadiness on feet    Urinary retention    Urinary retention    Urinary tract infection    Wears dentures    Wears glasses    Past Surgical History:  Procedure Laterality Date   AMPUTATION Left 03/29/2017   Procedure: LEFT GREAT TOE AMPUTATION AT METATARSOPHALANGEAL JOINT;  Surgeon: Nadara Mustard, MD;  Location: [facility] OR;  Service: Orthopedics;  Laterality: Left;   AMPUTATION Right 03/29/2017   Procedure: RIGHT BELOW KNEE AMPUTATION;  Surgeon: Nadara Mustard, MD;  Location: [facility] OR;  Service: Orthopedics;  Laterality: Right;   AMPUTATION Left 06/06/2018   Procedure: LEFT 2ND TOE AMPUTATION;  Surgeon: Nadara Mustard, MD;  Location: [facility] OR;  Service: Orthopedics;  Laterality: Left;   AMPUTATION Right 11/19/2018   Procedure: AMPUTATION ABOVE KNEE;  Surgeon: Nadara Mustard, MD;  Location: [facility] OR;  Service: Orthopedics;  Laterality: Right;   ANTERIOR CERVICAL CORPECTOMY N/A 11/25/2015   Procedure: ANTERIOR CERVICAL FIVE CORPECTOMY Cervical four - six fusion;  Surgeon: Lisbeth Renshaw, MD;  Location: [facility] OR[facility];  Service: Neurosurgery;  Laterality: N/A;  ANTERIOR CERVICAL FIVE CORPECTOMY Cervical four - six fusion   APPLICATION OF WOUND VAC Right 10/22/2018   Procedure: Application Of Wound Vac;  Surgeon: Nadara Mustard, MD;  Location: [facility] OR;  Service: Orthopedics;  Laterality: Right;  CYSTOSCOPY WITH BIOPSY N/A 05/01/2022   Procedure: CYSTOSCOPY WITH URETHRAL BIOPSY;  Surgeon: Noel ChristmasPace, Maryellen D, MD;   Location: [facility] ORS;  Service: Urology;  Laterality: N/A;  1 HR   CYSTOSCOPY WITH RETROGRADE URETHROGRAM N/A 04/25/2018   Procedure: CYSTOSCOPY WITH RETROGRADE URETHROGRAM/ BALLOON DILATION;  Surgeon: Ihor Gullyttelin, Mark, MD;  Location: [facility] ORS;  Service: Urology;  Laterality: N/A;   CYSTOSCOPY WITH URETHRAL DILATATION N/A 05/01/2022   Procedure: BALLOON DILATION WITH  OPTILUME;  Surgeon: Noel ChristmasPace, Maryellen D, MD;  Location: [facility] ORS;  Service: Urology;  Laterality: N/A;   MULTIPLE TOOTH EXTRACTIONS     POSTERIOR CERVICAL FUSION/FORAMINOTOMY N/A 11/29/2015   Procedure: Cervical Three-Cervical Seven Posterior Cervical Laminectomy with Fusion;  Surgeon: Lisbeth RenshawNeelesh Nundkumar, MD;  Location: [facility];  Service: Neurosurgery;  Laterality: N/A;  Cervical Three-Cervical Seven Posterior Cervical Laminectomy with Fusion   STUMP REVISION Right 10/22/2018   Procedure: REVISION RIGHT BELOW KNEE AMPUTATION;  Surgeon: Nadara Mustarduda, Marcus V, MD;  [facility];  Service: Orthopedics;  Laterality: Right;   STUMP REVISION Right 12/05/2018   Procedure: Revision Right Above Knee Amputation;  Surgeon: Nadara Mustarduda, Marcus V, MD;  [facility];  Service: Orthopedics;  Laterality: Right;   STUMP REVISION Right 05/29/2019   Procedure: REVISION RIGHT ABOVE KNEE AMPUTATION;  Surgeon: Nadara Mustarduda, Marcus V, MD;  [facility];  Service: Orthopedics;  Laterality: Right;   STUMP REVISION Right 06/24/2019   Procedure: STUMP REVISION;  Surgeon: Nadara Mustarduda, Marcus V, MD;  [facility];  Service: Orthopedics;  Laterality: Right;   FAMILY HISTORY Family History  Problem Relation Age of Onset   Diabetes Mother    Heart disease Mother    Cancer Mother    Cancer Father    Bone cancer Father    Prostate cancer Father    Cancer Paternal Grandfather    Cancer Other    Diabetes Other    SOCIAL HISTORY Social History   Tobacco Use   Smoking status: Every Day    Packs/day: .5    Types: Cigarettes   Smokeless tobacco: Never   Tobacco comments:    11/19/18   Vaping Use   Vaping Use: Never used  Substance Use Topics   Alcohol use: No   Drug use: No       OPHTHALMIC EXAM:  Base Eye Exam     Visual Acuity (Snellen - Linear)       Right Left   Dist [place] 20/30 -1 NLP   Dist ph [place] NI NI         Tonometry (Tonopen, 9:08 AM)       Right Left   Pressure 15 18         Pupils       Pupils Dark Light Shape React APD   Right PERRL 3 2 Round Sluggish None   Left PERRL 2 2 Round NR None         Visual Fields       Left Right   Restrictions Total superior temporal, inferior temporal, superior nasal, inferior nasal deficiencies          Extraocular Movement       Right Left    Full, Ortho Full, Ortho         Neuro/Psych     Oriented x3: Yes   Mood/Affect: Normal         Dilation     Both eyes: 2.5% Phenylephrine @ 9:08 AM  Slit Lamp and Fundus Exam     Slit Lamp Exam       Right Left   Lids/Lashes Dermatochalasis - upper lid Dermatochalasis - upper lid   Conjunctiva/Sclera White and quiet White and quiet   Cornea 2-3+ fine Punctate epithelial erosions Trace tear film debris   Anterior Chamber deep and clear deep and clear   Iris Round and dilated, No NVI Round and dilated, No NVI   Lens 2-3+ Nuclear sclerosis with mild brunescence, 2-3+ Cortical cataract 2-3+ Nuclear sclerosis with mild brunescence, 2-3+ Cortical cataract   Anterior Vitreous syneresis Posterior vitreous detachment         Fundus Exam       Right Left   Disc Pink and Sharp, Compact, mild PPP 4+pallor, mild PPP, Compact   C/D Ratio 0.2 0.2   Macula Flat, Good foveal reflex, rare Drusen, No heme or edema Flat, Blunted foveal reflex, central drusen and RPE clumping, No heme or edema   Vessels attenuated, Tortuous, no NV attenuated, Tortuous, no NV   Periphery Attached, No heme Attached, No heme            IMAGING AND PROCEDURES  Imaging and Procedures for 10/03/2022  OCT, Retina - OU - Both Eyes       Right  Eye Quality was good. Central Foveal Thickness: 314. Progression has no prior data. Findings include normal foveal contour, no IRF, no SRF.   Left Eye Quality was good. Central Foveal Thickness: 244. Progression has no prior data. Findings include normal foveal contour, no IRF, no SRF, retinal drusen (Mild diffuse retinal atrophy, focal perifoveal drusen).   Notes *Images captured and stored on drive  Diagnosis / Impression:  No DME OU OD: NFP, no IRF/SRF OS: Mild diffuse retinal atrophy, focal perifoveal drusen  Clinical management:  See below  Abbreviations: NFP - Normal foveal profile. CME - cystoid macular edema. PED - pigment epithelial detachment. IRF - intraretinal fluid. SRF - subretinal fluid. EZ - ellipsoid zone. ERM - epiretinal membrane. ORA - outer retinal atrophy. ORT - outer retinal tubulation. SRHM - subretinal hyper-reflective material. IRHM - intraretinal hyper-reflective material           ASSESSMENT/PLAN:   ICD-10-CM   1. Diabetes mellitus type 2 without retinopathy  E11.9 OCT, Retina - OU - Both Eyes    2. 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 History of stroke  Z86.73     4. Optic atrophy of left eye  H47.20     5. Retinal ischemia  H35.82     6. Retinal drusen, left  H35.362 OCT, Retina - OU - Both Eyes    7. Essential hypertension  I10     8. Hypertensive retinopathy of both eyes  H35.033     9. Combined forms of age-related cataract of both eyes  H25.813      1. Diabetes mellitus, type 2 without retinopathy  - A1c 6.9 on 11.14.23 - The incidence, risk factors for progression, natural history and treatment options for diabetic retinopathy  were discussed with patient.   - The need for close monitoring of blood glucose, blood pressure, and serum lipids, avoiding cigarette or any type of tobacco, and the need for long term follow up was also discussed with patient. - OCT w/o DME - f/u in 1 year, sooner  prn   2-5. Category 5 Blindness OS (NLP vision)  - pt reports long  history of low vision OS due to optic atrophy  - converted to NLP ~Feb 2024 prior to last visit with Dr. Dione Booze -- suspect retinal ischemia or further optic atrophy from ischemia  - pt also had recent CVA to R occipital cortex (4.2.24) - discussed findings - pt wears an eye patch over OS   6. Retinal drusen OS  7,8. Hypertensive retinopathy OU - discussed importance of tight BP control - monitor  9. Mixed Cataract OU - The symptoms of cataract, surgical options, and treatments and risks were discussed with patient. - discussed diagnosis and progression - under the expert management of Dr. Laruth Bouchard  Ophthalmic Meds Ordered this visit:  No orders of the defined types were placed in this encounter.    Return in about 1 year (around 10/03/2023) for annual DM exam; optic atrophy OS, DFE, OCT.  There are no Patient Instructions on file for this visit.  Explained the diagnoses, plan, and follow up with the patient and they expressed understanding.  Patient expressed understanding of the importance of proper follow up care.   This document serves as a record of services personally performed by Karie Chimera, MD, PhD. It was created on their behalf by De Blanch, an ophthalmic technician. The creation of this record is the provider's dictation and/or activities during the visit.    Electronically signed by: De Blanch, OA, 10/03/22  1:09 PM  This document serves as a record of services personally performed by Karie Chimera, MD, PhD. It was created on their behalf by Glee Arvin. Manson Passey, OA an ophthalmic technician. The creation of this record is the provider's dictation and/or activities during the visit.    Electronically signed by: Glee Arvin. Manson Passey, [place] 04.17.2024 1:09 PM  Karie Chimera, M.D., Ph.D. Diseases & Surgery of the Retina and Vitreous [facility] 10/03/2022  I have reviewed  the above documentation for accuracy and completeness, and I agree with the above. Karie Chimera, M.D., Ph.D. 10/03/22 1:09 PM   Abbreviations: M myopia (nearsighted); A astigmatism; H hyperopia (farsighted); P presbyopia; Mrx spectacle prescription;  CTL contact lenses; OD right eye; OS left eye; OU both eyes  XT exotropia; ET esotropia; PEK punctate epithelial keratitis; PEE punctate epithelial erosions; DES dry eye syndrome; MGD meibomian gland dysfunction; ATs artificial tears; PFAT's preservative free artificial tears; NSC nuclear sclerotic cataract; PSC posterior subcapsular cataract; ERM epi-retinal membrane; PVD posterior vitreous detachment; RD retinal detachment; DM diabetes mellitus; DR diabetic retinopathy; NPDR non-proliferative diabetic retinopathy; PDR proliferative diabetic retinopathy; CSME clinically significant macular edema; DME diabetic macular edema; dbh dot blot hemorrhages; CWS cotton wool spot; POAG primary open angle glaucoma; C/D cup-to-disc ratio; HVF humphrey visual field; GVF goldmann visual field; OCT optical coherence tomography; IOP intraocular pressure; BRVO Branch retinal vein occlusion; CRVO central retinal vein occlusion; CRAO central retinal artery occlusion; BRAO branch retinal artery occlusion; RT retinal tear; SB scleral buckle; PPV pars plana vitrectomy; VH Vitreous hemorrhage; PRP panretinal laser photocoagulation; IVK intravitreal kenalog; VMT vitreomacular traction; MH Macular hole;  NVD neovascularization of the disc; NVE neovascularization elsewhere; AREDS age related eye disease study; ARMD age related macular degeneration; POAG primary open angle glaucoma; EBMD epithelial/anterior basement membrane dystrophy; ACIOL anterior chamber intraocular lens; IOL intraocular lens; PCIOL posterior chamber intraocular lens; Phaco/IOL phacoemulsification with intraocular lens placement; PRK photorefractive keratectomy; LASIK laser assisted in situ keratomileusis; HTN  hypertension; DM diabetes mellitus; COPD chronic obstructive pulmonary disease

## 2022-10-01 ENCOUNTER — Ambulatory Visit (HOSPITAL_BASED_OUTPATIENT_CLINIC_OR_DEPARTMENT_OTHER): Payer: Medicaid Other | Admitting: Internal Medicine

## 2022-10-03 ENCOUNTER — Encounter (INDEPENDENT_AMBULATORY_CARE_PROVIDER_SITE_OTHER): Payer: Self-pay | Admitting: Ophthalmology

## 2022-10-03 ENCOUNTER — Ambulatory Visit (INDEPENDENT_AMBULATORY_CARE_PROVIDER_SITE_OTHER): Payer: Medicaid Other | Admitting: Ophthalmology

## 2022-10-03 DIAGNOSIS — H5442A5 Blindness left eye category 5, normal vision right eye: Secondary | ICD-10-CM

## 2022-10-03 DIAGNOSIS — H3581 Retinal edema: Secondary | ICD-10-CM

## 2022-10-03 DIAGNOSIS — H35033 Hypertensive retinopathy, bilateral: Secondary | ICD-10-CM

## 2022-10-03 DIAGNOSIS — Z8673 Personal history of transient ischemic attack (TIA), and cerebral infarction without residual deficits: Secondary | ICD-10-CM

## 2022-10-03 DIAGNOSIS — E119 Type 2 diabetes mellitus without complications: Secondary | ICD-10-CM

## 2022-10-03 DIAGNOSIS — H35362 Drusen (degenerative) of macula, left eye: Secondary | ICD-10-CM | POA: Diagnosis not present

## 2022-10-03 DIAGNOSIS — H25813 Combined forms of age-related cataract, bilateral: Secondary | ICD-10-CM

## 2022-10-03 DIAGNOSIS — H4720 Unspecified optic atrophy: Secondary | ICD-10-CM | POA: Diagnosis not present

## 2022-10-03 DIAGNOSIS — I10 Essential (primary) hypertension: Secondary | ICD-10-CM | POA: Diagnosis not present

## 2022-10-03 DIAGNOSIS — H3582 Retinal ischemia: Secondary | ICD-10-CM

## 2022-10-31 ENCOUNTER — Ambulatory Visit (HOSPITAL_BASED_OUTPATIENT_CLINIC_OR_DEPARTMENT_OTHER): Payer: Medicaid Other | Admitting: Internal Medicine

## 2022-11-05 ENCOUNTER — Other Ambulatory Visit: Payer: Self-pay | Admitting: Nephrology

## 2022-11-05 DIAGNOSIS — N1832 Chronic kidney disease, stage 3b: Secondary | ICD-10-CM

## 2022-11-06 ENCOUNTER — Encounter: Payer: Self-pay | Admitting: Nephrology

## 2022-11-26 NOTE — Progress Notes (Unsigned)
Patient: Gregory [name] Date of Birth: Oct 26, 1960  Reason for Visit: Stroke Visit  History from: Patient Primary Neurologist: Pearlean Brownie   ASSESSMENT AND PLAN 63 y.o. year old male presented with vision changes found to have subacute right occipital stroke.  Had a prior recent admission of cellulitis of the left lower extremity and sepsis.  Significant PVD. Vascular risk factors of hypertension, hyperlipidemia, type 2 diabetes uncontrolled, tobacco abuse.  1.  Stroke, subacute right occipital stroke likely large vessel disease with right PCA occlusion, left eye blindness  -Continue aspirin 81 mg daily for secondary stroke prevention -Strict management of vascular risk factors with a goal BP less than 130/90, A1c less than 7.0, LDL less than 70 for secondary stroke prevention -Will need lipid panel rechecked, he refused labs in the hospital -He is not interested in smoking cessation despite counseling -Would recommend continue close follow-up with ophthalmology -Go to the ER for any acute stroke symptoms, otherwise follow-up here on an as-needed basis   HISTORY OF PRESENT ILLNESS: Gregory [name] is here today for stroke clinic follow-up.  Presented 09/18/2022 for acute onset of vision loss started 1 to 2 weeks before presentation.  Had blindness to the left eye, right eye left hemianopia.  MRI showed right PCA subacute infarcts with possible petechial or subretinal hemorrhage.  At discharge, able to see objects but they were blurry and indistinct.  No antithrombotic prior to admission.  Placed on aspirin 81 mg daily, holding off on Plavix due to history of GI bleed and chronic anemia requiring transfusion as recent as March 2024.  History of PVD due to uncontrolled diabetes, right foot osteomyelitis status post BKA with stump infection and constant pain.  Left toe amputation.  Refused LDL blood draw, started on Lipitor 80 and fenofibrate.  A1c 6.26 April 2022 on insulin. Sent from [facility], SNF,  has lived there for 7 years, he is alone today. Since stroke, left sided vision is black, right side is clear. BP 141/60. In wheelchair bound, is non ambulatory. Noted foul urine smell. Has urinary leakage, uses urinal. Continues to smoke cigarettes, not interested in cessation. Has now issues or concerns today.   CT head new hypodensities in right occipital cortex and subcortical white matter, consistent with subacute to chronic ischemic changes MRI Mildly increased signal on diffusion-weighted images in the right occipital and posterior temporal lobes, favored to represent subacute infarcts, with possible susceptibility from petechial or subarachnoid hemorrhage. MRA Occlusion of the distal right P1/proximal right P2 segment, without evidence of reconstitution of the right PCA branches  Carotid Doppler unremarkable Lower extremity Doppler no DVT 2D Echo 55 to 60% LDL patient refused blood draw HgbA1c 6.9 in 04/2022 UDS positive for THC VTE prophylaxis -SCDs No antithrombotic prior to admission, now on aspirin 81 mg daily holding off on Plavix due to history of GI bleed and chronic anemia needing transfusion (08/2022)  HISTORY  09/18/22 Dr. Otelia Limes HPI: Gregory [name] is an 61 y.o. male with a PMHx of DM2, diabetic ulcer, COPD, depression, chronic right foot osteomyelitis, right BKA, left great toe amputation, stump infection, prior episode of acute respiratory failure, CHF, anemia, cancer, CKD, chronic back pain, epidural abscess, bilateral foot drop, HSV dermatitis, HLD, HTN, malignant melanoma, MRSA bacteremia, neurogenic bladder, idiopathic peripheral autonomic neuropathy, anterior cervical corpectomy and recent admissions for severe sepsis due to LLE cellulitis and multifocal PNA as well as acute on chronic hypoxic respiratory failure who presents to the ED from [facility] today with  acute onset of vision loss. He states that he has had progressively worsening vision changes that started about 2  weeks ago that worsened today. He does have prior history of vision loss due to "blood clot" On EMS arrival to his facility, the patient wad driving his motorized wheelchair around, but was also stating that his vision was "just black". He states that he can now only see shapes from his bilateral eyes. There is no associated eye pain. He has been nauseous but denies any vomiting. No limb weakness or numbness. He has not seen his optometrist since the vision loss began.    CT in the ED revealed a new hypodensity in the right occipital region concerning for subacute vs chronic infarct.   REVIEW OF SYSTEMS: Out of a complete 14 system review of symptoms, the patient complains only of the following symptoms, and all other reviewed systems are negative.  See HPI  ALLERGIES: Allergies  Allergen Reactions   Latex Other (See Comments)    "ALLERGIC," per [facility]    HOME MEDICATIONS: Outpatient Medications Prior to Visit  Medication Sig Dispense Refill   aspirin EC 81 MG tablet Take 81 mg by mouth daily. Swallow whole.     atorvastatin (LIPITOR) 80 MG tablet Take 1 tablet (80 mg total) by mouth daily. 30 tablet 0   cetirizine (ZYRTEC) 10 MG tablet Take 10 mg by mouth daily.     Cholecalciferol 25 MCG (1000 UT) capsule Take 1,000 Units by mouth daily.     Cranberry 450 MG CAPS Take 1 tablet by mouth daily.     cyanocobalamin 1000 MCG tablet Take 1,000 mcg by mouth daily.     Dextran 70-Hypromellose (NATURAL BALANCE TEARS) 0.1-0.3 % SOLN Apply 1 drop to eye every 4 (four) hours as needed (dry eyes).     docusate sodium (COLACE) 100 MG capsule Take 1 capsule (100 mg total) by mouth 2 (two) times daily. 10 capsule 0   escitalopram (LEXAPRO) 20 MG tablet Take 20 mg by mouth daily.     fenofibrate 54 MG tablet Take 1 tablet (54 mg total) by mouth daily. 30 tablet 0   ferrous sulfate 325 (65 FE) MG tablet Take 1 tablet (325 mg total) by mouth daily with breakfast. 30 tablet 0   fluticasone (FLONASE) 50  MCG/ACT nasal spray Place 1 spray into both nostrils in the morning.     hydrOXYzine (ATARAX) 25 MG tablet Take 25 mg by mouth every 6 (six) hours as needed.     insulin glargine (LANTUS SOLOSTAR) 100 UNIT/ML Solostar Pen Inject 15 Units into the skin daily. (Patient taking differently: Inject 15 Units into the skin at bedtime.)     insulin lispro (HUMALOG) 100 UNIT/ML KwikPen Inject 0-10 Units into the skin See admin instructions. Inject 0-10 units into the skin three times a day and at bedtime, PER SLIDING SCALE:  BGL 151-200 = 2 units 201-250 = 4 units 251-300 = 6 units 301-350 =  8 units 351-400 = 10 units (Patient taking differently: Inject 0-10 Units into the skin See admin instructions. Inject 0-10 units into the skin before meals and at bedtime, PER SLIDING SCALE:  BGL 151-200 = 2 units 201-250 = 4 units 251-300 = 6 units 301-350 =  8 units 351-400 = 10 units) 15 mL 11   ipratropium-albuterol (DUONEB) 0.5-2.5 (3) MG/3ML SOLN Take 3 mLs by nebulization every 6 (six) hours as needed (for shortness of breath).     lactulose (CHRONULAC) 10 GM/15ML solution  Take 15 mLs (10 g total) by mouth 3 (three) times daily as needed for moderate constipation. (Patient taking differently: Take 10 g by mouth every 8 (eight) hours as needed for moderate constipation.) 236 mL 0   magnesium oxide (MAG-OX) 400 MG tablet Take 400 mg by mouth 2 (two) times daily.     melatonin 3 MG TABS tablet Take 3 mg by mouth at bedtime.     metoprolol tartrate (LOPRESSOR) 25 MG tablet Take 0.5 tablets (12.5 mg total) by mouth 2 (two) times daily. 30 tablet 0   mirabegron ER (MYRBETRIQ) 50 MG TB24 tablet Take 50 mg by mouth daily.     naloxone (NARCAN) 4 MG/0.1ML LIQD nasal spray kit Place 1 spray into the nose as needed (poorly responsive or turning blue).     omeprazole (PRILOSEC) 20 MG capsule Take 20 mg by mouth at bedtime.     ondansetron (ZOFRAN) 4 MG tablet Take 4 mg by mouth every 8 (eight) hours as needed for  nausea.     Oxycodone HCl 20 MG TABS Take 1 tablet (20 mg total) by mouth every 8 (eight) hours as needed (severe pain). 10 tablet 0   phenazopyridine (PYRIDIUM) 100 MG tablet Take 100 mg by mouth every 8 (eight) hours as needed for pain.     polyethylene glycol (MIRALAX / GLYCOLAX) 17 g packet Take 17 g by mouth daily as needed for mild constipation.     pregabalin (LYRICA) 100 MG capsule Take 100 mg by mouth 2 (two) times daily.     prochlorperazine (COMPAZINE) 10 MG tablet Take 10 mg by mouth every 6 (six) hours as needed for nausea or vomiting.     QUEtiapine (SEROQUEL) 100 MG tablet Take 100 mg by mouth at bedtime.     sennosides-docusate sodium (SENOKOT-S) 8.6-50 MG tablet Take 1 tablet by mouth at bedtime.     tamsulosin (FLOMAX) 0.4 MG CAPS capsule Take 0.4 mg by mouth daily.     trolamine salicylate (ASPERCREME) 10 % cream Apply 1 Application topically every 6 (six) hours as needed for muscle pain.     TRULICITY 0.75 MG/0.5ML SOPN Inject 0.75 mg into the skin every Wednesday.     Amino Acids-Protein Hydrolys (PRO-STAT) LIQD Take 30 mLs by mouth in the morning.     diclofenac Sodium (VOLTAREN) 1 % GEL Apply 4 g topically See admin instructions. Apply to right leg topically every 6 hours as needed for pain     furosemide (LASIX) 40 MG tablet Take 1 tablet (40 mg total) by mouth daily.     gabapentin (NEURONTIN) 300 MG capsule Take 1 capsule (300 mg total) by mouth 2 (two) times daily. 5 capsule 0   Glucagon, rDNA, (GLUCAGON EMERGENCY) 1 MG KIT Inject 1 mg into the vein as needed (for hypolycemia).     hydrOXYzine (ATARAX) 25 MG tablet Take 25 mg by mouth every 8 (eight) hours as needed for anxiety.     hydrOXYzine (VISTARIL) 50 MG capsule Take 50 mg by mouth at bedtime.     Lidocaine 4 % PTCH Apply 1 patch topically See admin instructions. Apply 1 patch to both the right shoulder and the right stump at 9 AM daily- remove at 9 PM     Multiple Vitamins-Minerals (MULTIVITAMIN WITH MINERALS)  tablet Take 1 tablet by mouth daily.     Multiple Vitamins-Minerals (PRESERVISION AREDS 2) CAPS Take 1 capsule by mouth in the morning and at bedtime.     OXYGEN Inhale 2  L/min into the lungs as needed (for shortness of breath- to maintain sats >90%).     oxymetazoline (AFRIN 12 HOUR) 0.05 % nasal spray Place 1 spray into both nostrils every 12 (twelve) hours as needed for congestion.     Polyethyl Glycol-Propyl Glycol (LUBRICANT EYE DROPS) 0.4-0.3 % SOLN Place 2 drops into both eyes every 4 (four) hours as needed (dryness/itching).     tiotropium (SPIRIVA) 18 MCG inhalation capsule Place 18 mcg into inhaler and inhale daily.     No facility-administered medications prior to visit.    PAST MEDICAL HISTORY: 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    Chronic pain syndrome    COPD (chronic obstructive pulmonary disease) (HCC)    DDD (degenerative disc disease), lumbar    Depression    major depressive disorder    Diabetes mellitus without complication (HCC)    type 2    Diabetic ulcer of toe of left foot (HCC) 10/02/2016   Difficult intubation    Difficulty in walking, not elsewhere classified    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   Foot drop, bilateral    Foot drop, right 12/27/2015   GERD (gastroesophageal reflux disease)    Herpesviral vesicular dermatitis    History of COVID-19 03/15/2022   Hyperlipidemia    Hyperlipidemia    Hypertension    Insomnia    Malignant melanoma of other parts  of face (HCC)    Melanoma (HCC)    MRSA bacteremia    Muscle weakness (generalized)    Nasal congestion    Necrosis of toe (HCC) 07/08/2018   Neuromuscular dysfunction of bladder    Osteomyelitis (HCC)    Osteomyelitis (HCC)    Osteomyelitis (HCC)    right BKA   Other idiopathic peripheral autonomic neuropathy    Retention of urine, unspecified    Squamous cell carcinoma of skin    Stroke (HCC)    Unsteadiness on feet    Urinary retention    Urinary retention    Urinary tract infection    Wears dentures    Wears glasses     PAST SURGICAL HISTORY: Past Surgical History:  Procedure Laterality Date   AMPUTATION Left 03/29/2017   Procedure: LEFT GREAT TOE AMPUTATION AT METATARSOPHALANGEAL JOINT;  Surgeon: Nadara Mustard, MD;  Location[facility];  Service: Orthopedics;  Laterality: Left;   AMPUTATION Right 03/29/2017   Procedure: RIGHT BELOW KNEE AMPUTATION;  Surgeon: Nadara Mustard, MD;  Location: [facility] OR;  Service: Orthopedics;  Laterality: Right;   AMPUTATION Left 06/06/2018   Procedure: LEFT 2ND TOE AMPUTATION;  Surgeon: Nadara Mustard, MD;  Location: [facility] OR;  Service: Orthopedics;  Laterality: Left;   AMPUTATION Right 11/19/2018   Procedure: AMPUTATION ABOVE KNEE;  Surgeon: Nadara Mustard, MD;  Location: [facility] OR;  Service: Orthopedics;  Laterality: Right;   ANTERIOR CERVICAL CORPECTOMY N/A 11/25/2015   Procedure:  ANTERIOR CERVICAL FIVE CORPECTOMY Cervical four - six fusion;  Surgeon: Lisbeth Renshaw, MD;  Location: [facility];  Service: Neurosurgery;  Laterality: N/A;  ANTERIOR CERVICAL FIVE CORPECTOMY Cervical four - six fusion   APPLICATION OF WOUND VAC Right 10/22/2018   Procedure: Application Of Wound Vac;  Surgeon: Nadara Mustard, MD;  Location: [facility] OR;  Service: Orthopedics;  Laterality: Right;   CYSTOSCOPY WITH BIOPSY N/A 05/01/2022   Procedure: CYSTOSCOPY WITH URETHRAL BIOPSY;  Surgeon: Noel Christmas, MD;  Location: [facility] ORS;  Service: Urology;  Laterality: N/A;  1 HR    CYSTOSCOPY WITH RETROGRADE URETHROGRAM N/A 04/25/2018   Procedure: CYSTOSCOPY WITH RETROGRADE URETHROGRAM/ BALLOON DILATION;  Surgeon: Ihor Gully, MD;  Location: [facility] ORS;  Service: Urology;  Laterality: N/A;   CYSTOSCOPY WITH URETHRAL DILATATION N/A 05/01/2022   Procedure: BALLOON DILATION WITH  OPTILUME;  Surgeon: Noel Christmas, MD;  Location: [facility] ORS;  Service: Urology;  Laterality: N/A;   MULTIPLE TOOTH EXTRACTIONS     POSTERIOR CERVICAL FUSION/FORAMINOTOMY N/A 11/29/2015   Procedure: Cervical Three-Cervical Seven Posterior Cervical Laminectomy with Fusion;  Surgeon: Lisbeth Renshaw, MD;  Location: [facility];  Service: Neurosurgery;  Laterality: N/A;  Cervical Three-Cervical Seven Posterior Cervical Laminectomy with Fusion   STUMP REVISION Right 10/22/2018   Procedure: REVISION RIGHT BELOW KNEE AMPUTATION;  Surgeon: Nadara Mustard, MD;  Location: [facility] OR;  Service: Orthopedics;  Laterality: Right;   STUMP REVISION Right 12/05/2018   Procedure: Revision Right Above Knee Amputation;  Surgeon: Nadara Mustard, MD;  Location: [facility] OR;  Service: Orthopedics;  Laterality: Right;   STUMP REVISION Right 05/29/2019   Procedure: REVISION RIGHT ABOVE KNEE AMPUTATION;  Surgeon: Nadara Mustard, MD;  Location: [facility] OR;  Service: Orthopedics;  Laterality: Right;   STUMP REVISION Right 06/24/2019   Procedure: STUMP REVISION;  Surgeon: Nadara Mustard, MD;  Location: [facility] OR;  Service: Orthopedics;  Laterality: Right;    FAMILY HISTORY: Family History  Problem Relation Age of Onset   Diabetes Mother    Heart disease Mother    Cancer Mother    Cancer Father    Bone cancer Father    Prostate cancer Father    Cancer Paternal Grandfather    Cancer Other    Diabetes Other     SOCIAL HISTORY: Social History   Socioeconomic History   Marital status: Single    Spouse name: Not on file   Number of children: Not on file   Years of education: Not on file   Highest education level: Not on file  Occupational  History   Not on file  Tobacco Use   Smoking status: Every Day    Packs/day: .5    Types: Cigarettes   Smokeless tobacco: Never   Tobacco comments:    11/19/18  Vaping Use   Vaping Use: Never used  Substance and Sexual Activity   Alcohol use: No   Drug use: No   Sexual activity: Not Currently  Other Topics Concern   Not on file  Social History Narrative   Not on file   Social Determinants of Health   Financial Resource Strain: Not on file  Food Insecurity: No Food Insecurity (09/18/2022)   Hunger Vital Sign    Worried About Running Out of Food in the Last Year: Never true    Ran Out of Food in the Last Year: Never true  Transportation Needs: No Transportation Needs (09/18/2022)   PRAPARE - Transportation    Lack  of Transportation (Medical): No    Lack of Transportation (Non-Medical): No  Physical Activity: Not on file  Stress: Not on file  Social Connections: Not on file  Intimate Partner Violence: Not At Risk (09/18/2022)   Humiliation, Afraid, Rape, and Kick questionnaire    Fear of Current or Ex-Partner: No    Emotionally Abused: No    Physically Abused: No    Sexually Abused: No   PHYSICAL EXAM  Vitals:   11/27/22 0839  BP: (!) 141/60  Resp: 14  Weight: 247 lb 2.2 oz (112.1 kg)  Height: 6' (1.829 m)   Body mass index is 33.52 kg/m.  Generalized: Well developed, in no acute distress, disheveled, unkept, foul urine odor Neurological examination  Mentation: Alert oriented to time, place, history taking. Follows all commands speech and language fluent Cranial nerve II-XII: Pupils were equal round reactive to light. Extraocular movements were full, blind to the left eye.  Right eye appears normal upon testing.  No facial droop was appreciated.Marland Kitchen Head turning and shoulder shrug  were normal and symmetric. Motor: Good strength of upper extremities, right AKA stump with good strength, PVD to left lower extremity with swelling discoloration poor healing leg Sensory:  Sensory testing is intact to soft touch on all 4 extremities. No evidence of extinction is noted.  Coordination: Cerebellar testing reveals good finger-nose-finger bilaterally Gait and station: Nonambulatory Reflexes: Deep tendon reflexes are symmetric   DIAGNOSTIC DATA (LABS, IMAGING, TESTING) - I reviewed patient records, labs, notes, testing and imaging myself where available.  Lab Results  Component Value Date   WBC 7.0 09/21/2022   HGB 10.3 (L) 09/21/2022   HCT 33.6 (L) 09/21/2022   MCV 85.5 09/21/2022   PLT 323 09/21/2022      Component Value Date/Time   NA 136 09/21/2022 1749   NA 138 10/22/2016 0000   K 4.1 09/21/2022 1749   CL 105 09/21/2022 1749   CO2 23 09/21/2022 1749   GLUCOSE 226 (H) 09/21/2022 1749   BUN 21 09/21/2022 1749   BUN 26 (A) 10/22/2016 0000   CREATININE 1.60 (H) 09/21/2022 1749   CREATININE 1.95 (H) 02/24/2020 0938   CREATININE 2.67 (H) 10/02/2016 1651   CALCIUM 8.8 (L) 09/21/2022 1749   PROT 6.8 09/21/2022 1749   ALBUMIN 2.8 (L) 09/21/2022 1749   AST 16 09/21/2022 1749   AST 8 (L) 02/24/2020 0938   ALT 12 09/21/2022 1749   ALT 8 02/24/2020 0938   ALKPHOS 60 09/21/2022 1749   BILITOT 0.5 09/21/2022 1749   BILITOT 0.3 02/24/2020 0938   GFRNONAA 48 (L) 09/21/2022 1749   GFRNONAA 36 (L) 02/24/2020 0938   GFRNONAA 25 (L) 10/02/2016 1651   GFRAA 42 (L) 02/24/2020 0938   GFRAA 29 (L) 10/02/2016 1651   Lab Results  Component Value Date   CHOL 92 11/17/2018   HDL 19 (L) 11/17/2018   LDLCALC 18 11/17/2018   TRIG 143 07/11/2019   CHOLHDL 4.8 11/17/2018   Lab Results  Component Value Date   HGBA1C 6.9 (H) 05/01/2022   Lab Results  Component Value Date   VITAMINB12 366 09/06/2022   Lab Results  Component Value Date   TSH 0.786 09/06/2022    Margie Ege, AGNP-C, DNP 11/27/2022, 9:08 AM [facility] [address], [address] [place], [place] [address] [phone]

## 2022-11-27 ENCOUNTER — Ambulatory Visit (INDEPENDENT_AMBULATORY_CARE_PROVIDER_SITE_OTHER): Payer: Medicaid Other | Admitting: Neurology

## 2022-11-27 ENCOUNTER — Encounter: Payer: Self-pay | Admitting: Neurology

## 2022-11-27 VITALS — BP 141/60 | Resp 14 | Ht 72.0 in | Wt 247.1 lb

## 2022-11-27 DIAGNOSIS — I10 Essential (primary) hypertension: Secondary | ICD-10-CM

## 2022-11-27 DIAGNOSIS — E1142 Type 2 diabetes mellitus with diabetic polyneuropathy: Secondary | ICD-10-CM | POA: Diagnosis not present

## 2022-11-27 DIAGNOSIS — Z7984 Long term (current) use of oral hypoglycemic drugs: Secondary | ICD-10-CM

## 2022-11-27 DIAGNOSIS — I639 Cerebral infarction, unspecified: Secondary | ICD-10-CM

## 2022-11-27 NOTE — Patient Instructions (Addendum)
Continue aspirin 81 mg daily for secondary stroke prevention   Strict management of vascular risk factors with a goal BP less than 130/90, A1c less than 7.0, LDL less than 70 for secondary stroke prevention  Will need recheck of lipid panel  Go to the ER for any acute stroke symptoms   Continue follow up to ophthalmology

## 2022-11-27 NOTE — Progress Notes (Signed)
I agree with the above plan 

## 2022-11-30 ENCOUNTER — Other Ambulatory Visit: Payer: Medicaid Other

## 2022-11-30 ENCOUNTER — Encounter (HOSPITAL_BASED_OUTPATIENT_CLINIC_OR_DEPARTMENT_OTHER): Payer: Medicaid Other | Attending: Internal Medicine | Admitting: Internal Medicine

## 2022-11-30 DIAGNOSIS — E11622 Type 2 diabetes mellitus with other skin ulcer: Secondary | ICD-10-CM | POA: Diagnosis not present

## 2022-11-30 DIAGNOSIS — L98492 Non-pressure chronic ulcer of skin of other sites with fat layer exposed: Secondary | ICD-10-CM | POA: Insufficient documentation

## 2022-11-30 DIAGNOSIS — I890 Lymphedema, not elsewhere classified: Secondary | ICD-10-CM | POA: Diagnosis not present

## 2022-11-30 DIAGNOSIS — E11621 Type 2 diabetes mellitus with foot ulcer: Secondary | ICD-10-CM | POA: Diagnosis present

## 2022-11-30 DIAGNOSIS — L97822 Non-pressure chronic ulcer of other part of left lower leg with fat layer exposed: Secondary | ICD-10-CM | POA: Diagnosis not present

## 2022-11-30 DIAGNOSIS — I509 Heart failure, unspecified: Secondary | ICD-10-CM | POA: Insufficient documentation

## 2022-11-30 DIAGNOSIS — I130 Hypertensive heart and chronic kidney disease with heart failure and stage 1 through stage 4 chronic kidney disease, or unspecified chronic kidney disease: Secondary | ICD-10-CM | POA: Insufficient documentation

## 2022-11-30 DIAGNOSIS — N183 Chronic kidney disease, stage 3 unspecified: Secondary | ICD-10-CM | POA: Insufficient documentation

## 2022-11-30 DIAGNOSIS — J449 Chronic obstructive pulmonary disease, unspecified: Secondary | ICD-10-CM | POA: Insufficient documentation

## 2022-11-30 DIAGNOSIS — E1122 Type 2 diabetes mellitus with diabetic chronic kidney disease: Secondary | ICD-10-CM | POA: Diagnosis not present

## 2022-11-30 DIAGNOSIS — E785 Hyperlipidemia, unspecified: Secondary | ICD-10-CM | POA: Diagnosis not present

## 2022-11-30 DIAGNOSIS — I272 Pulmonary hypertension, unspecified: Secondary | ICD-10-CM | POA: Diagnosis not present

## 2022-11-30 DIAGNOSIS — Z89611 Acquired absence of right leg above knee: Secondary | ICD-10-CM | POA: Diagnosis not present

## 2022-11-30 DIAGNOSIS — S31829A Unspecified open wound of left buttock, initial encounter: Secondary | ICD-10-CM | POA: Insufficient documentation

## 2022-11-30 DIAGNOSIS — K219 Gastro-esophageal reflux disease without esophagitis: Secondary | ICD-10-CM | POA: Insufficient documentation

## 2022-11-30 DIAGNOSIS — L97522 Non-pressure chronic ulcer of other part of left foot with fat layer exposed: Secondary | ICD-10-CM | POA: Insufficient documentation

## 2022-12-02 NOTE — Progress Notes (Signed)
FADEL, CHEATAM ([id number]) [phone] [id number] Page 1 of 4 Visit Report for 11/30/2022 Abuse Risk Screen Details Patient Name: Date of Service: Gregory [name], Gregory [name] 11/30/2022 8:00 A M Medical Record Number: [id number] Patient Account Number: [id number] Date of Birth/Sex: Treating RN: 03/17/1961 (61 y.o. Dianna Limbo Primary Care Arnaldo Heffron: Margo Common, S Other Clinician: Referring Ceniyah Thorp: Treating Kyliee Ortego/Extender: Freddi Che N-SIE, S Weeks in Treatment: 0 Abuse Risk Screen Items Answer ABUSE RISK SCREEN: Has anyone close to you tried to hurt or harm you recentlyo No Do you feel uncomfortable with anyone in your familyo No Has anyone forced you do things that you didnt want to doo No Electronic Signature(s) Signed: 11/30/2022 2:16:10 PM By: Karie Schwalbe RN Entered By: Karie Schwalbe on 11/30/2022 08:26:58 -------------------------------------------------------------------------------- Activities of Daily Living Details Patient Name: Date of Service: Gregory [name], Gregory [name] 11/30/2022 8:00 A M Medical Record Number: [id number] Patient Account Number: [id number] Date of Birth/Sex: Treating RN: 1961/05/28 (62 y.o. Dianna Limbo Primary Care Shalen Petrak: Margo Common, S Other Clinician: Referring Fiana Gladu: Treating Urie Loughner/Extender: Freddi Che N-SIE, S Weeks in Treatment: 0 Activities of Daily Living Items Answer Activities of Daily Living (Please select one for each item) Drive Automobile Not Able T Medications ake Completely Able Use T elephone Completely Able Care for Appearance Completely Able Use T oilet Completely Able Bath / Shower Completely Able Dress Self Completely Able Feed Self Completely Able Walk Not Able Get In / Out Bed Completely Able Housework Not Able Prepare Meals Not Able Handle Money Not Able Shop for Self Not Able Electronic Signature(s) Signed: 11/30/2022 2:16:10 PM By: Karie Schwalbe RN Entered By:  Karie Schwalbe on 11/30/2022 08:27:20 Gregory [name] ([id number]) [id number] Page 2 of 4 -------------------------------------------------------------------------------- Education Screening Details Patient Name: Date of Service: Gregory [name], Gregory [name] 11/30/2022 8:00 A M Medical Record Number: [id number] Patient Account Number: [id number] Date of Birth/Sex: Treating RN: 1961-04-28 (62 y.o. Dianna Limbo Primary Care Nicola Heinemann: Margo Common, S Other Clinician: Referring Kaleigha Chamberlin: Treating Sunnie Odden/Extender: Carey Bullocks, S Weeks in Treatment: 0 Primary Learner Assessed: Patient Learning Preferences/Education Level/Primary Language Learning Preference: Explanation, Printed Material Highest Education Level: High School Preferred Language: English Cognitive Barrier Language Barrier: No Translator Needed: No Memory Deficit: No Emotional Barrier: No Cultural/Religious Beliefs Affecting Medical Care: No Physical Barrier Impaired Vision: Yes Glasses Impaired Hearing: No Knowledge/Comprehension Knowledge Level: High Comprehension Level: High Ability to understand written instructions: High Ability to understand verbal instructions: High Motivation Anxiety Level: Calm Cooperation: Cooperative Education Importance: Acknowledges Need Interest in Health Problems: Asks Questions Perception: Coherent Willingness to Engage in Self-Management High Activities: Readiness to Engage in Self-Management High Activities: Electronic Signature(s) Signed: 11/30/2022 2:16:10 PM By: Karie Schwalbe RN Entered By: Karie Schwalbe on 11/30/2022 08:27:53 -------------------------------------------------------------------------------- Fall Risk Assessment Details Patient Name: Date of Service: Gregory [name], Gregory [name] 11/30/2022 8:00 A M Medical Record Number: [id number] Patient Account Number: [id number] Date of Birth/Sex: Treating RN: [birth year] (62 y.o. Dianna Limbo Primary Care Terri Rorrer: Margo Common, S Other Clinician: Referring Kathey Simer: Treating Ahni Bradwell/Extender: Freddi Che N-SIE, S Weeks in Treatment: 0 Fall Risk Assessment Items Have you had 2 or more falls in the last 12 monthso 0 Yes Have you had any fall that resulted in injury in the last 12 monthso 0 No Gregory [name], Gregory [name] ([id number]) [id number] Page 3 of 4 FALLS RISK SCREEN History of falling - immediate or within 3 months 25 Yes Secondary diagnosis (Do you have 2 or more medical diagnoseso)  0 No Ambulatory aid None/bed rest/wheelchair/nurse 0 Yes Crutches/cane/walker 0 No Furniture 0 No Intravenous therapy Access/Saline/Heparin Lock 0 No Gait/Transferring Normal/ bed rest/ wheelchair 0 Yes Weak (short steps with or without shuffle, stooped but able to lift head while walking, may seek 0 No support from furniture) Impaired (short steps with shuffle, may have difficulty arising from chair, head down, impaired 0 No balance) Mental Status Oriented to own ability 0 Yes Electronic Signature(s) Signed: 11/30/2022 2:16:10 PM By: Karie Schwalbe RN Entered By: Karie Schwalbe on 11/30/2022 08:28:04 -------------------------------------------------------------------------------- Foot Assessment Details Patient Name: Date of Service: Gregory Lope [name] 11/30/2022 8:00 A M Medical Record Number: [id number] Patient Account Number: [id number] Date of Birth/Sex: Treating RN: 04/02/1961 (62 y.o. Dianna Limbo Primary Care Talya Quain: Margo Common, S Other Clinician: Referring Cammie Faulstich: Treating Aili Casillas/Extender: Freddi Che N-SIE, S Weeks in Treatment: 0 Foot Assessment Items Site Locations + = Sensation present, - = Sensation absent, C = Callus, U = Ulcer R = Redness, W = Warmth, M = Maceration, PU = Pre-ulcerative lesion F = Fissure, S = Swelling, D = Dryness Assessment Right: Left: Other Deformity: No No Prior Foot Ulcer: No  No Prior Amputation: No No Charcot Joint: No No Ambulatory Status: Ambulatory With Help Assistance Device: Wheelchair GaitBATU, Gregory [name] ([id number]) [phone] [id number] Page 4 of 4 Notes Left Great toe,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 11/30/2022 8:00 A M Medical Record Number: [id number] Patient Account Number: [id number] Date of Birth/Sex: Treating RN: 1960/12/19 (62 y.o. Dianna Limbo Primary Care Emil Klassen: Margo Common, S Other Clinician: Referring Niala Stcharles: Treating Makailah Slavick/Extender: Freddi Che N-SIE, S Weeks in Treatment: 0 Height (in): Weight (lbs): Body Mass Index (BMI): Nutrition Risk Screening Items Score Screening NUTRITION RISK SCREEN: I have an illness or condition that made me change the kind and/or amount of food I eat 2 Yes I eat fewer than two meals per day 0 No I eat few fruits and vegetables, or milk products 0 No I have three or more drinks of beer, liquor or wine almost every day 0 No I have tooth or mouth problems that make it hard for me to eat 0 No I don't always have enough money to buy the food I need 0 No I eat alone most of the time 0 No I take three or more different prescribed or over-the-counter drugs a day 1 Yes Without wanting to, I have lost or gained 10 pounds in the last six months 0 No I am not always physically able to shop, cook and/or feed myself 2 Yes Nutrition Protocols Good Risk Protocol Provide education on elevated blood Moderate Risk Protocol 0 sugars and impact on wound healing, as applicable High Risk Proctocol Risk Level: Moderate Risk Score: 5 Electronic Signature(s) Signed:  11/30/2022 2:16:10 PM By: Karie Schwalbe RN Entered By: Karie Schwalbe on 11/30/2022 08:29:13

## 2022-12-03 ENCOUNTER — Emergency Department (HOSPITAL_COMMUNITY): Payer: Medicaid Other

## 2022-12-03 ENCOUNTER — Inpatient Hospital Stay (HOSPITAL_COMMUNITY)
Admission: EM | Admit: 2022-12-03 | Discharge: 2022-12-25 | DRG: 853 | Disposition: A | Discharge status: Skilled Nursing Facility | Payer: Medicaid Other | Source: Skilled Nursing Facility | Attending: Internal Medicine | Admitting: Internal Medicine

## 2022-12-03 ENCOUNTER — Encounter (HOSPITAL_COMMUNITY)
Admission: EM | Disposition: A | Discharge status: Skilled Nursing Facility | Payer: Self-pay | Source: Skilled Nursing Facility | Attending: Internal Medicine

## 2022-12-03 DIAGNOSIS — E875 Hyperkalemia: Secondary | ICD-10-CM | POA: Diagnosis not present

## 2022-12-03 DIAGNOSIS — N1832 Chronic kidney disease, stage 3b: Secondary | ICD-10-CM | POA: Diagnosis not present

## 2022-12-03 DIAGNOSIS — A419 Sepsis, unspecified organism: Principal | ICD-10-CM

## 2022-12-03 DIAGNOSIS — E872 Acidosis, unspecified: Secondary | ICD-10-CM

## 2022-12-03 DIAGNOSIS — I509 Heart failure, unspecified: Secondary | ICD-10-CM | POA: Diagnosis not present

## 2022-12-03 DIAGNOSIS — E8809 Other disorders of plasma-protein metabolism, not elsewhere classified: Secondary | ICD-10-CM | POA: Diagnosis present

## 2022-12-03 DIAGNOSIS — Z89611 Acquired absence of right leg above knee: Secondary | ICD-10-CM

## 2022-12-03 DIAGNOSIS — E1122 Type 2 diabetes mellitus with diabetic chronic kidney disease: Secondary | ICD-10-CM | POA: Diagnosis present

## 2022-12-03 DIAGNOSIS — E1140 Type 2 diabetes mellitus with diabetic neuropathy, unspecified: Secondary | ICD-10-CM | POA: Diagnosis present

## 2022-12-03 DIAGNOSIS — I890 Lymphedema, not elsewhere classified: Secondary | ICD-10-CM | POA: Diagnosis present

## 2022-12-03 DIAGNOSIS — Z85820 Personal history of malignant melanoma of skin: Secondary | ICD-10-CM

## 2022-12-03 DIAGNOSIS — N319 Neuromuscular dysfunction of bladder, unspecified: Secondary | ICD-10-CM | POA: Diagnosis present

## 2022-12-03 DIAGNOSIS — I4892 Unspecified atrial flutter: Secondary | ICD-10-CM | POA: Diagnosis not present

## 2022-12-03 DIAGNOSIS — N400 Enlarged prostate without lower urinary tract symptoms: Secondary | ICD-10-CM | POA: Diagnosis present

## 2022-12-03 DIAGNOSIS — E669 Obesity, unspecified: Secondary | ICD-10-CM | POA: Diagnosis present

## 2022-12-03 DIAGNOSIS — Z6835 Body mass index (BMI) 35.0-35.9, adult: Secondary | ICD-10-CM

## 2022-12-03 DIAGNOSIS — L89893 Pressure ulcer of other site, stage 3: Secondary | ICD-10-CM | POA: Diagnosis not present

## 2022-12-03 DIAGNOSIS — D62 Acute posthemorrhagic anemia: Secondary | ICD-10-CM | POA: Diagnosis not present

## 2022-12-03 DIAGNOSIS — I872 Venous insufficiency (chronic) (peripheral): Secondary | ICD-10-CM | POA: Diagnosis present

## 2022-12-03 DIAGNOSIS — Z833 Family history of diabetes mellitus: Secondary | ICD-10-CM

## 2022-12-03 DIAGNOSIS — G894 Chronic pain syndrome: Secondary | ICD-10-CM | POA: Diagnosis present

## 2022-12-03 DIAGNOSIS — M549 Dorsalgia, unspecified: Secondary | ICD-10-CM | POA: Diagnosis present

## 2022-12-03 DIAGNOSIS — Z89412 Acquired absence of left great toe: Secondary | ICD-10-CM

## 2022-12-03 DIAGNOSIS — Z9981 Dependence on supplemental oxygen: Secondary | ICD-10-CM

## 2022-12-03 DIAGNOSIS — J9811 Atelectasis: Secondary | ICD-10-CM | POA: Diagnosis present

## 2022-12-03 DIAGNOSIS — F17210 Nicotine dependence, cigarettes, uncomplicated: Secondary | ICD-10-CM | POA: Diagnosis not present

## 2022-12-03 DIAGNOSIS — E43 Unspecified severe protein-calorie malnutrition: Secondary | ICD-10-CM | POA: Diagnosis not present

## 2022-12-03 DIAGNOSIS — D6489 Other specified anemias: Secondary | ICD-10-CM | POA: Diagnosis present

## 2022-12-03 DIAGNOSIS — Z89511 Acquired absence of right leg below knee: Secondary | ICD-10-CM

## 2022-12-03 DIAGNOSIS — E785 Hyperlipidemia, unspecified: Secondary | ICD-10-CM | POA: Diagnosis present

## 2022-12-03 DIAGNOSIS — E1165 Type 2 diabetes mellitus with hyperglycemia: Secondary | ICD-10-CM | POA: Diagnosis present

## 2022-12-03 DIAGNOSIS — R6521 Severe sepsis with septic shock: Secondary | ICD-10-CM | POA: Diagnosis not present

## 2022-12-03 DIAGNOSIS — J449 Chronic obstructive pulmonary disease, unspecified: Secondary | ICD-10-CM | POA: Diagnosis present

## 2022-12-03 DIAGNOSIS — I480 Paroxysmal atrial fibrillation: Secondary | ICD-10-CM | POA: Diagnosis present

## 2022-12-03 DIAGNOSIS — I420 Dilated cardiomyopathy: Secondary | ICD-10-CM | POA: Diagnosis not present

## 2022-12-03 DIAGNOSIS — J9601 Acute respiratory failure with hypoxia: Secondary | ICD-10-CM | POA: Diagnosis not present

## 2022-12-03 DIAGNOSIS — G4700 Insomnia, unspecified: Secondary | ICD-10-CM | POA: Diagnosis present

## 2022-12-03 DIAGNOSIS — K567 Ileus, unspecified: Secondary | ICD-10-CM | POA: Diagnosis present

## 2022-12-03 DIAGNOSIS — N179 Acute kidney failure, unspecified: Secondary | ICD-10-CM | POA: Diagnosis not present

## 2022-12-03 DIAGNOSIS — Z794 Long term (current) use of insulin: Secondary | ICD-10-CM

## 2022-12-03 DIAGNOSIS — I5032 Chronic diastolic (congestive) heart failure: Secondary | ICD-10-CM | POA: Diagnosis present

## 2022-12-03 DIAGNOSIS — R6520 Severe sepsis without septic shock: Secondary | ICD-10-CM

## 2022-12-03 DIAGNOSIS — Z6830 Body mass index (BMI) 30.0-30.9, adult: Secondary | ICD-10-CM

## 2022-12-03 DIAGNOSIS — Z9889 Other specified postprocedural states: Secondary | ICD-10-CM

## 2022-12-03 DIAGNOSIS — D72829 Elevated white blood cell count, unspecified: Secondary | ICD-10-CM | POA: Diagnosis not present

## 2022-12-03 DIAGNOSIS — Z8619 Personal history of other infectious and parasitic diseases: Secondary | ICD-10-CM

## 2022-12-03 DIAGNOSIS — L89312 Pressure ulcer of right buttock, stage 2: Secondary | ICD-10-CM | POA: Diagnosis present

## 2022-12-03 DIAGNOSIS — D631 Anemia in chronic kidney disease: Secondary | ICD-10-CM | POA: Diagnosis present

## 2022-12-03 DIAGNOSIS — J690 Pneumonitis due to inhalation of food and vomit: Secondary | ICD-10-CM | POA: Diagnosis not present

## 2022-12-03 DIAGNOSIS — D649 Anemia, unspecified: Secondary | ICD-10-CM | POA: Diagnosis not present

## 2022-12-03 DIAGNOSIS — A4151 Sepsis due to Escherichia coli [E. coli]: Secondary | ICD-10-CM | POA: Diagnosis not present

## 2022-12-03 DIAGNOSIS — Z8614 Personal history of Methicillin resistant Staphylococcus aureus infection: Secondary | ICD-10-CM

## 2022-12-03 DIAGNOSIS — F32A Depression, unspecified: Secondary | ICD-10-CM | POA: Diagnosis present

## 2022-12-03 DIAGNOSIS — Z781 Physical restraint status: Secondary | ICD-10-CM

## 2022-12-03 DIAGNOSIS — E874 Mixed disorder of acid-base balance: Secondary | ICD-10-CM | POA: Diagnosis present

## 2022-12-03 DIAGNOSIS — Z981 Arthrodesis status: Secondary | ICD-10-CM

## 2022-12-03 DIAGNOSIS — K219 Gastro-esophageal reflux disease without esophagitis: Secondary | ICD-10-CM | POA: Diagnosis present

## 2022-12-03 DIAGNOSIS — E876 Hypokalemia: Secondary | ICD-10-CM | POA: Diagnosis not present

## 2022-12-03 DIAGNOSIS — Z8673 Personal history of transient ischemic attack (TIA), and cerebral infarction without residual deficits: Secondary | ICD-10-CM

## 2022-12-03 DIAGNOSIS — R579 Shock, unspecified: Secondary | ICD-10-CM | POA: Diagnosis not present

## 2022-12-03 DIAGNOSIS — Z7985 Long-term (current) use of injectable non-insulin antidiabetic drugs: Secondary | ICD-10-CM

## 2022-12-03 DIAGNOSIS — Z8616 Personal history of COVID-19: Secondary | ICD-10-CM | POA: Diagnosis not present

## 2022-12-03 DIAGNOSIS — I130 Hypertensive heart and chronic kidney disease with heart failure and stage 1 through stage 4 chronic kidney disease, or unspecified chronic kidney disease: Secondary | ICD-10-CM | POA: Diagnosis not present

## 2022-12-03 DIAGNOSIS — N170 Acute kidney failure with tubular necrosis: Secondary | ICD-10-CM | POA: Diagnosis not present

## 2022-12-03 DIAGNOSIS — E1151 Type 2 diabetes mellitus with diabetic peripheral angiopathy without gangrene: Secondary | ICD-10-CM | POA: Diagnosis present

## 2022-12-03 DIAGNOSIS — G9009 Other idiopathic peripheral autonomic neuropathy: Secondary | ICD-10-CM | POA: Diagnosis present

## 2022-12-03 DIAGNOSIS — J9621 Acute and chronic respiratory failure with hypoxia: Secondary | ICD-10-CM | POA: Diagnosis not present

## 2022-12-03 DIAGNOSIS — Z85828 Personal history of other malignant neoplasm of skin: Secondary | ICD-10-CM

## 2022-12-03 DIAGNOSIS — I110 Hypertensive heart disease with heart failure: Secondary | ICD-10-CM | POA: Diagnosis not present

## 2022-12-03 DIAGNOSIS — E871 Hypo-osmolality and hyponatremia: Secondary | ICD-10-CM | POA: Diagnosis present

## 2022-12-03 DIAGNOSIS — R188 Other ascites: Secondary | ICD-10-CM | POA: Diagnosis not present

## 2022-12-03 DIAGNOSIS — K559 Vascular disorder of intestine, unspecified: Secondary | ICD-10-CM | POA: Diagnosis not present

## 2022-12-03 DIAGNOSIS — N390 Urinary tract infection, site not specified: Secondary | ICD-10-CM | POA: Diagnosis present

## 2022-12-03 DIAGNOSIS — Z91040 Latex allergy status: Secondary | ICD-10-CM

## 2022-12-03 DIAGNOSIS — Z79899 Other long term (current) drug therapy: Secondary | ICD-10-CM

## 2022-12-03 DIAGNOSIS — Z8249 Family history of ischemic heart disease and other diseases of the circulatory system: Secondary | ICD-10-CM

## 2022-12-03 DIAGNOSIS — K55039 Acute (reversible) ischemia of large intestine, extent unspecified: Secondary | ICD-10-CM | POA: Diagnosis not present

## 2022-12-03 DIAGNOSIS — Z7982 Long term (current) use of aspirin: Secondary | ICD-10-CM

## 2022-12-03 DIAGNOSIS — R1084 Generalized abdominal pain: Secondary | ICD-10-CM | POA: Diagnosis not present

## 2022-12-03 HISTORY — PX: LAPAROTOMY: SHX154

## 2022-12-03 LAB — CBC WITH DIFFERENTIAL/PLATELET
Abs Immature Granulocytes: 0.16 10*3/uL — ABNORMAL HIGH (ref 0.00–0.07)
Basophils Absolute: 0.1 10*3/uL (ref 0.0–0.1)
Basophils Relative: 0 %
Eosinophils Absolute: 0 10*3/uL (ref 0.0–0.5)
Eosinophils Relative: 0 %
HCT: 47.3 % (ref 39.0–52.0)
Hemoglobin: 14.4 g/dL (ref 13.0–17.0)
Immature Granulocytes: 1 %
Lymphocytes Relative: 2 %
Lymphs Abs: 0.7 10*3/uL (ref 0.7–4.0)
MCH: 27.6 pg (ref 26.0–34.0)
MCHC: 30.4 g/dL (ref 30.0–36.0)
MCV: 90.8 fL (ref 80.0–100.0)
Monocytes Absolute: 2 10*3/uL — ABNORMAL HIGH (ref 0.1–1.0)
Monocytes Relative: 7 %
Neutro Abs: 25.6 10*3/uL — ABNORMAL HIGH (ref 1.7–7.7)
Neutrophils Relative %: 90 %
Platelets: 500 10*3/uL — ABNORMAL HIGH (ref 150–400)
RBC: 5.21 MIL/uL (ref 4.22–5.81)
RDW: 15.8 % — ABNORMAL HIGH (ref 11.5–15.5)
WBC: 28.7 10*3/uL — ABNORMAL HIGH (ref 4.0–10.5)
nRBC: 0 % (ref 0.0–0.2)

## 2022-12-03 LAB — URINALYSIS, W/ REFLEX TO CULTURE (INFECTION SUSPECTED)

## 2022-12-03 LAB — COMPREHENSIVE METABOLIC PANEL
ALT: 18 U/L (ref 0–44)
AST: 24 U/L (ref 15–41)
Albumin: 3.5 g/dL (ref 3.5–5.0)
Alkaline Phosphatase: 78 U/L (ref 38–126)
Anion gap: 20 — ABNORMAL HIGH (ref 5–15)
BUN: 51 mg/dL — ABNORMAL HIGH (ref 8–23)
CO2: 15 mmol/L — ABNORMAL LOW (ref 22–32)
Calcium: 9.3 mg/dL (ref 8.9–10.3)
Chloride: 98 mmol/L (ref 98–111)
Creatinine, Ser: 3.38 mg/dL — ABNORMAL HIGH (ref 0.61–1.24)
GFR, Estimated: 20 mL/min — ABNORMAL LOW (ref >60)
Glucose, Bld: 269 mg/dL — ABNORMAL HIGH (ref 70–99)
Potassium: 4.8 mmol/L (ref 3.5–5.1)
Sodium: 133 mmol/L — ABNORMAL LOW (ref 135–145)
Total Bilirubin: 1 mg/dL (ref 0.3–1.2)
Total Protein: 8.4 g/dL — ABNORMAL HIGH (ref 6.5–8.1)

## 2022-12-03 LAB — I-STAT CHEM 8, ED
BUN: 48 mg/dL — ABNORMAL HIGH (ref 8–23)
Calcium, Ion: 1.08 mmol/L — ABNORMAL LOW (ref 1.15–1.40)
Chloride: 105 mmol/L (ref 98–111)
Creatinine, Ser: 3.3 mg/dL — ABNORMAL HIGH (ref 0.61–1.24)
Glucose, Bld: 277 mg/dL — ABNORMAL HIGH (ref 70–99)
HCT: 47 % (ref 39.0–52.0)
Hemoglobin: 16 g/dL (ref 13.0–17.0)
Potassium: 4.7 mmol/L (ref 3.5–5.1)
Sodium: 133 mmol/L — ABNORMAL LOW (ref 135–145)
TCO2: 18 mmol/L — ABNORMAL LOW (ref 22–32)

## 2022-12-03 LAB — RESP PANEL BY RT-PCR (RSV, FLU A&B, COVID)  RVPGX2
Influenza A by PCR: NEGATIVE
Influenza B by PCR: NEGATIVE
Resp Syncytial Virus by PCR: NEGATIVE
SARS Coronavirus 2 by RT PCR: NEGATIVE

## 2022-12-03 LAB — LACTIC ACID, PLASMA
Lactic Acid, Venous: 3.1 mmol/L (ref 0.5–1.9)
Lactic Acid, Venous: 4.1 mmol/L (ref 0.5–1.9)
Lactic Acid, Venous: 6.1 mmol/L (ref 0.5–1.9)
Lactic Acid, Venous: 8.6 mmol/L (ref 0.5–1.9)

## 2022-12-03 LAB — PROTIME-INR
INR: 1.1 (ref 0.8–1.2)
Prothrombin Time: 13.9 seconds (ref 11.4–15.2)

## 2022-12-03 LAB — URINE CULTURE

## 2022-12-03 LAB — TROPONIN I (HIGH SENSITIVITY)
Troponin I (High Sensitivity): 32 ng/L — ABNORMAL HIGH (ref <18)
Troponin I (High Sensitivity): 63 ng/L — ABNORMAL HIGH (ref <18)

## 2022-12-03 LAB — BRAIN NATRIURETIC PEPTIDE: B Natriuretic Peptide: 45.7 pg/mL (ref 0.0–100.0)

## 2022-12-03 LAB — BETA-HYDROXYBUTYRIC ACID: Beta-Hydroxybutyric Acid: 0.17 mmol/L (ref 0.05–0.27)

## 2022-12-03 LAB — LIPASE, BLOOD: Lipase: 21 U/L (ref 11–51)

## 2022-12-03 LAB — APTT: aPTT: 29 seconds (ref 24–36)

## 2022-12-03 SURGERY — LAPAROTOMY, EXPLORATORY
Anesthesia: General

## 2022-12-03 MED ORDER — LACTATED RINGERS IV BOLUS
1000.0000 mL | Freq: Once | INTRAVENOUS | Status: AC
  Administered 2022-12-03: 1000 mL via INTRAVENOUS

## 2022-12-03 MED ORDER — LACTATED RINGERS IV SOLN: INTRAVENOUS | Status: DC

## 2022-12-03 MED ORDER — ACETAMINOPHEN 500 MG PO TABS
1000.0000 mg | ORAL_TABLET | Freq: Once | ORAL | Status: AC
  Administered 2022-12-03: 1000 mg via ORAL
  Filled 2022-12-03: qty 2

## 2022-12-03 MED ORDER — DOCUSATE SODIUM 100 MG PO CAPS: 100.0000 mg | ORAL_CAPSULE | Freq: Two times a day (BID) | ORAL | Status: DC | PRN

## 2022-12-03 MED ORDER — FENTANYL CITRATE PF 50 MCG/ML IJ SOSY
50.0000 ug | PREFILLED_SYRINGE | Freq: Once | INTRAMUSCULAR | Status: AC
  Administered 2022-12-03: 50 ug via INTRAVENOUS
  Filled 2022-12-03: qty 1

## 2022-12-03 MED ORDER — IOHEXOL 350 MG/ML SOLN
75.0000 mL | Freq: Once | INTRAVENOUS | Status: AC | PRN
  Administered 2022-12-03: 75 mL via INTRAVENOUS

## 2022-12-03 MED ORDER — VANCOMYCIN HCL 1250 MG/250ML IV SOLN
1250.0000 mg | INTRAVENOUS | Status: DC
  Administered 2022-12-05 – 2022-12-07 (×2): 1250 mg via INTRAVENOUS
  Filled 2022-12-03 (×2): qty 250

## 2022-12-03 MED ORDER — POLYETHYLENE GLYCOL 3350 17 G PO PACK: 17.0000 g | PACK | Freq: Every day | ORAL | Status: DC | PRN

## 2022-12-03 MED ORDER — INSULIN ASPART 100 UNIT/ML IJ SOLN
0.0000 [IU] | INTRAMUSCULAR | Status: DC
  Administered 2022-12-04: 3 [IU] via SUBCUTANEOUS
  Administered 2022-12-04: 8 [IU] via SUBCUTANEOUS
  Administered 2022-12-04: 3 [IU] via SUBCUTANEOUS
  Administered 2022-12-04 (×2): 5 [IU] via SUBCUTANEOUS
  Administered 2022-12-04: 3 [IU] via SUBCUTANEOUS
  Administered 2022-12-05: 2 [IU] via SUBCUTANEOUS
  Administered 2022-12-05: 3 [IU] via SUBCUTANEOUS
  Administered 2022-12-05: 5 [IU] via SUBCUTANEOUS
  Administered 2022-12-06 – 2022-12-08 (×6): 2 [IU] via SUBCUTANEOUS
  Administered 2022-12-09: 3 [IU] via SUBCUTANEOUS
  Administered 2022-12-09 – 2022-12-10 (×6): 2 [IU] via SUBCUTANEOUS
  Administered 2022-12-10: 3 [IU] via SUBCUTANEOUS
  Administered 2022-12-10 – 2022-12-11 (×3): 2 [IU] via SUBCUTANEOUS
  Administered 2022-12-11: 5 [IU] via SUBCUTANEOUS
  Administered 2022-12-11: 2 [IU] via SUBCUTANEOUS
  Administered 2022-12-11: 3 [IU] via SUBCUTANEOUS
  Administered 2022-12-11: 5 [IU] via SUBCUTANEOUS
  Administered 2022-12-12: 3 [IU] via SUBCUTANEOUS
  Administered 2022-12-12: 5 [IU] via SUBCUTANEOUS
  Administered 2022-12-12 – 2022-12-13 (×9): 3 [IU] via SUBCUTANEOUS

## 2022-12-03 MED ORDER — PIPERACILLIN-TAZOBACTAM 3.375 G IVPB
3.3750 g | Freq: Three times a day (TID) | INTRAVENOUS | Status: DC
  Administered 2022-12-04 – 2022-12-05 (×4): 3.375 g via INTRAVENOUS
  Filled 2022-12-03 (×4): qty 50

## 2022-12-03 MED ORDER — VANCOMYCIN HCL 2000 MG/400ML IV SOLN
2000.0000 mg | Freq: Once | INTRAVENOUS | Status: AC
  Administered 2022-12-03: 2000 mg via INTRAVENOUS
  Filled 2022-12-03: qty 400

## 2022-12-03 MED ORDER — HEPARIN SODIUM (PORCINE) 5000 UNIT/ML IJ SOLN
5000.0000 [IU] | Freq: Three times a day (TID) | INTRAMUSCULAR | Status: DC
  Administered 2022-12-04 – 2022-12-07 (×10): 5000 [IU] via SUBCUTANEOUS
  Filled 2022-12-03 (×10): qty 1

## 2022-12-03 MED ORDER — CALCIUM GLUCONATE-NACL 1-0.675 GM/50ML-% IV SOLN
1.0000 g | Freq: Once | INTRAVENOUS | Status: AC
  Administered 2022-12-03: 1000 mg via INTRAVENOUS
  Filled 2022-12-03: qty 50

## 2022-12-03 MED ORDER — PIPERACILLIN-TAZOBACTAM 3.375 G IVPB 30 MIN
3.3750 g | Freq: Once | INTRAVENOUS | Status: AC
  Administered 2022-12-03: 3.375 g via INTRAVENOUS
  Filled 2022-12-03: qty 50

## 2022-12-03 SURGICAL SUPPLY — 47 items
APL PRP STRL LF DISP 70% ISPRP (MISCELLANEOUS) ×1
BAG COUNTER SPONGE SURGICOUNT (BAG) ×1 IMPLANT
BAG SPNG CNTER NS LX DISP (BAG) ×1
BNDG GAUZE DERMACEA FLUFF 4 (GAUZE/BANDAGES/DRESSINGS) IMPLANT
BNDG GZE DERMACEA 4 6PLY (GAUZE/BANDAGES/DRESSINGS) ×1
CHLORAPREP W/TINT 26 (MISCELLANEOUS) ×1 IMPLANT
COVER SURGICAL LIGHT HANDLE (MISCELLANEOUS) ×1 IMPLANT
DRAIN CHANNEL 19F RND (DRAIN) IMPLANT
DRAIN RELI 100 BL SUC LF ST (DRAIN) ×1
DRAPE LAPAROSCOPIC ABDOMINAL (DRAPES) ×1 IMPLANT
DRAPE WARM FLUID 44X44 (DRAPES) ×1 IMPLANT
DRSG OPSITE POSTOP 4X10 (GAUZE/BANDAGES/DRESSINGS) IMPLANT
DRSG OPSITE POSTOP 4X8 (GAUZE/BANDAGES/DRESSINGS) IMPLANT
ELECT BLADE 6.5 EXT (BLADE) IMPLANT
ELECT CAUTERY BLADE 6.4 (BLADE) ×1 IMPLANT
ELECT REM PT RETURN 9FT ADLT (ELECTROSURGICAL) ×1
ELECTRODE REM PT RTRN 9FT ADLT (ELECTROSURGICAL) ×1 IMPLANT
EVACUATOR SILICONE 100CC (DRAIN) IMPLANT
GAUZE PAD ABD 8X10 STRL (GAUZE/BANDAGES/DRESSINGS) IMPLANT
GAUZE SPONGE 4X4 12PLY STRL (GAUZE/BANDAGES/DRESSINGS) IMPLANT
GLOVE BIOGEL PI IND STRL 7.0 (GLOVE) ×1 IMPLANT
GLOVE SURG SS PI 7.0 STRL IVOR (GLOVE) ×1 IMPLANT
GOWN STRL REUS W/ TWL LRG LVL3 (GOWN DISPOSABLE) ×2 IMPLANT
GOWN STRL REUS W/TWL LRG LVL3 (GOWN DISPOSABLE) ×2
HANDLE SUCTION POOLE (INSTRUMENTS) ×1 IMPLANT
KIT BASIN OR (CUSTOM PROCEDURE TRAY) ×1 IMPLANT
KIT OSTOMY DRAINABLE 2.75 STR (WOUND CARE) IMPLANT
LIGASURE IMPACT 36 18CM CVD LR (INSTRUMENTS) IMPLANT
PACK GENERAL/GYN (CUSTOM PROCEDURE TRAY) ×1 IMPLANT
PENCIL SMOKE EVACUATOR (MISCELLANEOUS) ×1 IMPLANT
RELOAD PROXIMATE 100 BLUE (MISCELLANEOUS) ×2 IMPLANT
RELOAD PROXIMATE 100MM BLUE (MISCELLANEOUS) ×2
RELOAD STAPLE 100 3.8 BLU REG (MISCELLANEOUS) IMPLANT
SPONGE T-LAP 18X18 ~~LOC~~+RFID (SPONGE) IMPLANT
STAPLER PROXIMATE 100MM BLUE (MISCELLANEOUS) IMPLANT
STAPLER VISISTAT 35W (STAPLE) ×1 IMPLANT
SUCTION POOLE HANDLE (INSTRUMENTS) ×1
SUT PDS AB 0 CT 36 (SUTURE) IMPLANT
SUT PDS AB 3-0 SH 27 (SUTURE) ×2 IMPLANT
SUT SILK 2 0 SH CR/8 (SUTURE) ×1 IMPLANT
SUT SILK 2 0 TIES 10X30 (SUTURE) ×1 IMPLANT
SUT SILK 3 0 SH CR/8 (SUTURE) ×1 IMPLANT
SUT SILK 3 0 TIES 10X30 (SUTURE) ×1 IMPLANT
SUT VIC AB 3-0 SH 18 (SUTURE) IMPLANT
TOWEL GREEN STERILE (TOWEL DISPOSABLE) ×1 IMPLANT
TRAY FOLEY MTR SLVR 16FR STAT (SET/KITS/TRAYS/PACK) ×1 IMPLANT
YANKAUER SUCT BULB TIP NO VENT (SUCTIONS) IMPLANT

## 2022-12-03 NOTE — ED Triage Notes (Signed)
Pt BIB [facility] from [facility] for Abd pain since this AM. PT has hx of COPD per EMS.  Not on 02 at baseline, requiring 2 L currently. 86% RA on arrival to hospital. EMS endorses Rales  in lower lobes. Pt is complaining of urinary retention. His brief is wet enough to soak the sheet underneath and has a UTI odor.   EMS VS 126/80 91% RA, 93% 4L, ETC02 20

## 2022-12-03 NOTE — Progress Notes (Signed)
ZIYAD, STINCHCOMB ([id number]) [phone].pdf Page 1 of 15 Visit Report for 11/30/2022 Chief Complaint Document Details Patient Name: Date of Service: Gregory [name], Gregory [name] 11/30/2022 8:00 A M Medical Record Number: [id number] Patient Account Number: [id number] Date of Birth/Sex: Treating RN: 1961-06-13 (62 y.o. M) Primary Care Provider: Margo Common, Kathie Rhodes Other Clinician: Referring Provider: Treating Provider/Extender: Freddi Che N-SIE, S Weeks in Treatment: 0 Information Obtained from: Patient Chief Complaint 11/30/2022; left lower extremity wounds, left buttocks wound Electronic Signature(s) Signed: 12/03/2022 12:26:35 PM By: Geralyn Corwin DO Entered By: Geralyn Corwin on 11/30/2022 11:15:22 -------------------------------------------------------------------------------- Debridement Details Patient Name: Date of Service: Shea Evans, RA NDY L. 11/30/2022 8:00 A M Medical Record Number: [id number] Patient Account Number: [id number] Date of Birth/Sex: Treating RN: 05-29-1961 (62 y.o. Dianna Limbo Primary Care Provider: Margo Common, S Other Clinician: Referring Provider: Treating Provider/Extender: Freddi Che N-SIE, S Weeks in Treatment: 0 Debridement Performed for Assessment: Wound #4 Left Knee Performed By: Physician Geralyn Corwin, DO Debridement Type: Chemical/Enzymatic/Mechanical Agent Used: Other Severity of Tissue Pre Debridement: Fat layer exposed Level of Consciousness (Pre-procedure): Awake and Alert Pre-procedure Verification/Time Out Yes - 09:23 Taken: Start Time: 09:23 Percent of Wound Bed Debrided: Instrument: Other : Mechanical Bleeding: None End Time: 09:24 Procedural Pain: 0 Post Procedural Pain: 0 Response to Treatment: Procedure was tolerated well Level of Consciousness (Post- Awake and Alert procedure): Post Debridement Measurements of Total Wound Length: (cm) 1.2 Width: (cm) 1.8 Depth: (cm) 0.4 Volume: (cm)  0.679 Character of Wound/Ulcer Post Debridement: Improved Severity of Tissue Post Debridement: Fat layer exposed Post Procedure Diagnosis Same as Pre-procedure SELIGA, Zykeem L ([id number]) [id number] Page 2 of 15 Notes Scribed for Dr. Mikey Bussing by J.Scotton Electronic Signature(s) Signed: 11/30/2022 2:16:10 PM By: Karie Schwalbe RN Signed: 12/03/2022 12:26:35 PM By: Geralyn Corwin DO Entered By: Karie Schwalbe on 11/30/2022 09:26:53 -------------------------------------------------------------------------------- Debridement Details Patient Name: Date of Service: Duane Lope NDY L. 11/30/2022 8:00 A M Medical Record Number: [id number] Patient Account Number: [id number] Date of Birth/Sex: Treating RN: 08-11-1960 (62 y.o. Dianna Limbo Primary Care Provider: Margo Common, S Other Clinician: Referring Provider: Treating Provider/Extender: Freddi Che N-SIE, S Weeks in Treatment: 0 Debridement Performed for Assessment: Wound #5 Left,Lateral T Fifth oe Performed By: Physician Geralyn Corwin, DO Debridement Type: Chemical/Enzymatic/Mechanical Agent Used: Other Severity of Tissue Pre Debridement: Fat layer exposed Level of Consciousness (Pre-procedure): Awake and Alert Pre-procedure Verification/Time Out Yes - 09:23 Taken: Start Time: 09:23 Percent of Wound Bed Debrided: Instrument: Other : Mechanical Bleeding: None End Time: 09:24 Procedural Pain: 0 Post Procedural Pain: 0 Response to Treatment: Procedure was tolerated well Level of Consciousness (Post- Awake and Alert procedure): Post Debridement Measurements of Total Wound Length: (cm) 5.4 Width: (cm) 2.5 Depth: (cm) 0.1 Volume: (cm) 1.06 Character of Wound/Ulcer Post Debridement: Improved Severity of Tissue Post Debridement: Fat layer exposed Post Procedure Diagnosis Same as Pre-procedure Notes Scribed for Dr. Mikey Bussing by J.Scotton Electronic Signature(s) Signed: 11/30/2022 2:16:10  PM By: Karie Schwalbe RN Signed: 12/03/2022 12:26:35 PM By: Geralyn Corwin DO Entered By: Karie Schwalbe on 11/30/2022 09:28:14 -------------------------------------------------------------------------------- Debridement Details Patient Name: Date of Service: Shea Evans, RA NDY L. 11/30/2022 8:00 A M Medical Record Number: [id number] Patient Account Number: [id number] ACY, OBARA ([id number]) [phone].pdf Page 3 of 15 Date of Birth/Sex: Treating RN: Nov 16, 1960 (62 y.o. Dianna Limbo Primary Care Provider: Other Clinician: Margo Common, S Referring Provider: Treating Provider/Extender: Freddi Che N-SIE, S Weeks in Treatment: 0 Debridement Performed for Assessment: Wound #6 Left,Dorsal Foot Performed  By: Physician Geralyn Corwin, DO Debridement Type: Chemical/Enzymatic/Mechanical Agent Used: Other Severity of Tissue Pre Debridement: Fat layer exposed Level of Consciousness (Pre-procedure): Awake and Alert Pre-procedure Verification/Time Out Yes - 09:23 Taken: Start Time: 09:23 Percent of Wound Bed Debrided: Instrument: Other : Mechanical Bleeding: None End Time: 09:24 Procedural Pain: 0 Post Procedural Pain: 0 Response to Treatment: Procedure was tolerated well Level of Consciousness (Post- Awake and Alert procedure): Post Debridement Measurements of Total Wound Length: (cm) 0.5 Width: (cm) 2.1 Depth: (cm) 0.4 Volume: (cm) 0.33 Character of Wound/Ulcer Post Debridement: Improved Severity of Tissue Post Debridement: Fat layer exposed Post Procedure Diagnosis Same as Pre-procedure Notes Scribed for Dr. Mikey Bussing by J.Scotton Electronic Signature(s) Signed: 11/30/2022 2:16:10 PM By: Karie Schwalbe RN Signed: 12/03/2022 12:26:35 PM By: Geralyn Corwin DO Entered By: Karie Schwalbe on 11/30/2022 09:28:59 -------------------------------------------------------------------------------- Debridement Details Patient Name: Date of  Service: Shea Evans, RA NDY L. 11/30/2022 8:00 A M Medical Record Number: [id number] Patient Account Number: [id number] Date of Birth/Sex: Treating RN: 1961/02/21 (62 y.o. Dianna Limbo Primary Care Provider: Margo Common, S Other Clinician: Referring Provider: Treating Provider/Extender: Freddi Che N-SIE, S Weeks in Treatment: 0 Debridement Performed for Assessment: Wound #7 Left,Lateral Upper Leg Performed By: Physician Geralyn Corwin, DO Debridement Type: Chemical/Enzymatic/Mechanical Agent Used: Other Level of Consciousness (Pre-procedure): Awake and Alert Pre-procedure Verification/Time Out Yes - 09:23 Taken: Start Time: 09:23 Percent of Wound Bed Debrided: Instrument: Other : Mechanical Bleeding: None End Time: 09:24 Procedural Pain: 0 Post Procedural Pain: 0 Response to Treatment: Procedure was tolerated well Level of Consciousness (Post- Awake and Alert procedure): ZYDEN, SEEGARS ([id number]) [id number] Page 4 of 15 Post Debridement Measurements of Total Wound Length: (cm) 2.2 Width: (cm) 0.5 Depth: (cm) 0.1 Volume: (cm) 0.086 Character of Wound/Ulcer Post Debridement: Improved Post Procedure Diagnosis Same as Pre-procedure Notes Scribed for Dr. Mikey Bussing by J.Scotton Electronic Signature(s) Signed: 11/30/2022 2:16:10 PM By: Karie Schwalbe RN Signed: 12/03/2022 12:26:35 PM By: Geralyn Corwin DO Entered By: Karie Schwalbe on 11/30/2022 09:29:48 -------------------------------------------------------------------------------- Debridement Details Patient Name: Date of Service: Shea Evans, RA NDY L. 11/30/2022 8:00 A M Medical Record Number: [id number] Patient Account Number: [id number] Date of Birth/Sex: Treating RN: 12/13/1960 (62 y.o. Dianna Limbo Primary Care Provider: Margo Common, S Other Clinician: Referring Provider: Treating Provider/Extender: Freddi Che N-SIE, S Weeks in Treatment: 0 Debridement Performed  for Assessment: Wound #8 Left Gluteus Performed By: Physician Geralyn Corwin, DO Debridement Type: Chemical/Enzymatic/Mechanical Agent Used: Other Level of Consciousness (Pre-procedure): Awake and Alert Pre-procedure Verification/Time Out Yes - 09:23 Taken: Start Time: 09:23 Percent of Wound Bed Debrided: Instrument: Other : Mechanical Bleeding: None End Time: 09:24 Procedural Pain: 0 Post Procedural Pain: 0 Response to Treatment: Procedure was tolerated well Level of Consciousness (Post- Awake and Alert procedure): Post Debridement Measurements of Total Wound Length: (cm) 1.3 Width: (cm) 1.1 Depth: (cm) 0.1 Volume: (cm) 0.112 Character of Wound/Ulcer Post Debridement: Improved Post Procedure Diagnosis Same as Pre-procedure Notes Scribed for Dr. Mikey Bussing by J.Scotton Electronic Signature(s) Signed: 11/30/2022 2:16:10 PM By: Karie Schwalbe RN Signed: 12/03/2022 12:26:35 PM By: Geralyn Corwin DO Entered By: Karie Schwalbe on 11/30/2022 09:30:28 Clay, [name] ([id number]) [id number] Page 5 of 15 -------------------------------------------------------------------------------- HPI Details Patient Name: Date of Service: ARIEON, PHILIP 11/30/2022 8:00 A M Medical Record Number: [id number] Patient Account Number: [id number] Date of Birth/Sex: Treating RN: [birth year] (62 y.o. M) Primary Care Provider: Jackolyn Confer Other Clinician: Referring Provider: Treating Provider/Extender: Freddi Che N-SIE, S Weeks in Treatment: 0 History  of Present Illness HPI Description: 11/30/2022 Mr. 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 He has been using Medihoney to the wound beds. He resides at [facility]. He is not sure how the wound started but he does sit for long periods of time in his wheelchair and likely created wounds to his feet and buttocks due to this. The knee wound was likely traumatic. Currently denies systemic signs of infection. Electronic Signature(s) Signed: 12/03/2022 12:26:35 PM By: Geralyn Corwin DO Entered By: Geralyn Corwin on 11/30/2022 11:20:01 -------------------------------------------------------------------------------- Physical Exam Details Patient Name: Date of Service: WESSLEY, MONTESI NDY L. 11/30/2022 8:00 A M Medical Record Number: [id number] Patient Account Number: [id number] Date of Birth/Sex: Treating RN: 06/27/1960 (62 y.o. M) Primary Care Provider: Jackolyn Confer Other Clinician: Referring Provider: Treating Provider/Extender: Freddi Che N-SIE, S Weeks in Treatment: 0 Constitutional respirations regular, non-labored and within target range for patient.. Cardiovascular 2+ dorsalis pedis/posterior tibialis pulses. Psychiatric pleasant and cooperative. Notes Left lower extremity: Significant lymphedema with taut skin and edema skin changes. Open wound to the dorsal foot granulation tissue, lateral left foot wound pale granulation tissue. T the upper thigh there is skin breakdown. T the left buttocks there is a wound with granulation tissue. Overall no obvious signs of o o infection. Electronic Signature(s) Signed: 12/03/2022 12:26:35 PM By: Geralyn Corwin DO Entered By: Geralyn Corwin on 11/30/2022 11:21:19 -------------------------------------------------------------------------------- Physician Orders Details Patient Name: Date of Service: Shea Evans, RA NDY L. 11/30/2022 8:00 A Adria Devon, [name] ([id number]) [id number] Page 6 of 15 Medical Record Number: [id number] Patient Account Number: [id number] Date of Birth/Sex: Treating RN: 1960-06-26 (62 y.o. Dianna Limbo Primary Care Provider:  Margo Common, S Other Clinician: Referring Provider: Treating Provider/Extender: Freddi Che N-SIE, S Weeks in Treatment: 0 Verbal / Phone Orders: No Diagnosis Coding ICD-10 Coding Code Description [phone] Non-pressure chronic ulcer of other part of left lower leg with fat layer exposed L97.522 Non-pressure chronic ulcer of other part of left foot with fat layer exposed S31.829A Unspecified open wound of left buttock, initial encounter L98.492 Non-pressure chronic ulcer of skin of other sites with fat layer exposed Z89.611 Acquired absence of right leg above knee Z89.422 Acquired absence of other left toe(s) J44.9 Chronic obstructive pulmonary disease, unspecified E11.621 Type 2 diabetes mellitus with foot ulcer E11.622 Type 2 diabetes mellitus with other skin ulcer I89.0 Lymphedema, not elsewhere classified Follow-up Appointments ppointment in 2 weeks. - Dr. Mikey Bussing Room 9 Return A Friday 28th July at 10:15am Anesthetic Wound #4 Left Knee (In clinic) Topical Lidocaine 4% applied to wound bed Wound #5 Left,Lateral T Fifth oe (In clinic) Topical Lidocaine 4% applied to wound bed Wound #6 Left,Dorsal Foot (In clinic) Topical Lidocaine 4% applied to wound bed Wound #7 Left,Lateral Upper Leg (In clinic) Topical Lidocaine 4% applied to wound bed Wound #8 Left Gluteus (In clinic) Topical Lidocaine 4% applied to wound bed Bathing/ Shower/ Hygiene May shower and wash wound with soap and water. Edema Control - Lymphedema / SCD / Other Left Lower Extremity Elevate legs to the level of the heart or above for 30 minutes daily and/or when sitting for 3-4 times a day throughout the day. Moisturize legs daily. Off-Loading Wound #6 Left,Dorsal Foot Gel  wheelchair cushion - Keep using either Gel cushion for wheelchair or Roho cushion for wheelchair Roho cushion for wheelchair - Keep using either Gel cushion for wheelchair or Roho cushion for wheelchair Other: - Wear Bunny Boot  (left foot) Additional Orders / Instructions Follow Nutritious Diet Wound Treatment Wound #4 - Knee Wound Laterality: Left Cleanser: Soap and Water 1 x Per Day/30 Days Discharge Instructions: May shower and wash wound with dial antibacterial soap and water prior to dressing change. Cleanser: Vashe 5.8 (oz) 1 x Per Day/30 Days Discharge Instructions: Cleanse the wound with Vashe prior to applying a clean dressing using gauze sponges, not tissue or cotton balls. Cleanser: Wound Cleanser 1 x Per Day/30 Days Discharge Instructions: Cleanse the wound with wound cleanser prior to applying a clean dressing using gauze sponges, not tissue or cotton balls. Prim Dressing: Hydrofera Blue Ready Transfer Foam, 2.5x2.5 (in/in) 1 x Per Day/30 Days ary Discharge Instructions: Apply directly to wound bed as directed Prim Dressing: MediHoney Gel, tube 1.5 (oz) 1 x Per Day/30 Days ary Discharge Instructions: Apply to wound bed as instructed JEANPAUL, PIEROTTI ([id number]) [phone].pdf Page 7 of 15 Secondary Dressing: Bordered Gauze, 2x3.75 in 1 x Per Day/30 Days Discharge Instructions: Apply over primary dressing as directed. Wound #5 - T Fifth oe Wound Laterality: Left, Lateral Cleanser: Soap and Water 1 x Per Day/30 Days Discharge Instructions: May shower and wash wound with dial antibacterial soap and water prior to dressing change. Cleanser: Vashe 5.8 (oz) 1 x Per Day/30 Days Discharge Instructions: Cleanse the wound with Vashe prior to applying a clean dressing using gauze sponges, not tissue or cotton balls. Cleanser: Wound Cleanser 1 x Per Day/30 Days Discharge Instructions: Cleanse the wound with wound cleanser prior to applying a clean dressing using gauze sponges, not tissue or cotton balls. Prim Dressing: Hydrofera Blue Ready Transfer Foam, 4x5 (in/in) 1 x Per Day/30 Days ary Discharge Instructions: Apply to wound bed as instructed Prim Dressing: MediHoney Gel, tube 1.5  (oz) 1 x Per Day/30 Days ary Discharge Instructions: Apply to wound bed as instructed Secondary Dressing: Bordered Gauze, 4x4 in 1 x Per Day/30 Days Discharge Instructions: Apply over primary dressing as directed. Wound #6 - Foot Wound Laterality: Dorsal, Left Cleanser: Soap and Water 1 x Per Day/30 Days Discharge Instructions: May shower and wash wound with dial antibacterial soap and water prior to dressing change. Cleanser: Vashe 5.8 (oz) 1 x Per Day/30 Days Discharge Instructions: Cleanse the wound with Vashe prior to applying a clean dressing using gauze sponges, not tissue or cotton balls. Cleanser: Wound Cleanser 1 x Per Day/30 Days Discharge Instructions: Cleanse the wound with wound cleanser prior to applying a clean dressing using gauze sponges, not tissue or cotton balls. Prim Dressing: Hydrofera Blue Ready Transfer Foam, 2.5x2.5 (in/in) 1 x Per Day/30 Days ary Discharge Instructions: Apply directly to wound bed as directed Prim Dressing: MediHoney Gel, tube 1.5 (oz) 1 x Per Day/30 Days ary Discharge Instructions: Apply to wound bed as instructed Secondary Dressing: Bordered Gauze, 2x3.75 in 1 x Per Day/30 Days Discharge Instructions: Apply over primary dressing as directed. Wound #7 - Upper Leg Wound Laterality: Left, Lateral Cleanser: Soap and Water 1 x Per Day/30 Days Discharge Instructions: May shower and wash wound with dial antibacterial soap and water prior to dressing change. Cleanser: Vashe 5.8 (oz) 1 x Per Day/30 Days Discharge Instructions: Cleanse the wound with Vashe prior to applying a clean dressing using gauze sponges, not tissue or cotton balls. Cleanser: Wound  Cleanser 1 x Per Day/30 Days Discharge Instructions: Cleanse the wound with wound cleanser prior to applying a clean dressing using gauze sponges, not tissue or cotton balls. Prim Dressing: Hydrofera Blue Ready Transfer Foam, 2.5x2.5 (in/in) 1 x Per Day/30 Days ary Discharge Instructions: Apply directly  to wound bed as directed Secondary Dressing: Bordered Gauze, 2x2 in 1 x Per Day/30 Days Discharge Instructions: Apply over primary dressing as directed. Wound #8 - Gluteus Wound Laterality: Left Cleanser: Soap and Water 1 x Per Day/30 Days Discharge Instructions: May shower and wash wound with dial antibacterial soap and water prior to dressing change. Cleanser: Vashe 5.8 (oz) 1 x Per Day/30 Days Discharge Instructions: Cleanse the wound with Vashe prior to applying a clean dressing using gauze sponges, not tissue or cotton balls. Cleanser: Wound Cleanser 1 x Per Day/30 Days Discharge Instructions: Cleanse the wound with wound cleanser prior to applying a clean dressing using gauze sponges, not tissue or cotton balls. Prim Dressing: Hydrofera Blue Ready Transfer Foam, 2.5x2.5 (in/in) 1 x Per Day/30 Days ary Discharge Instructions: Apply directly to wound bed as directed Secondary Dressing: Bordered Gauze, 2x2 in 1 x Per Day/30 Days Discharge Instructions: Apply over primary dressing as directed. COLBEN, DENNO ([id number]) [phone].pdf Page 8 of 15 Consults Vascular - Left leg/Left foot. Arterial Duplex and ABI and TBI - (ICD10 L97.822 - Non-pressure chronic ulcer of other part of left lower leg with fat layer exposed) Electronic Signature(s) Signed: 11/30/2022 2:16:10 PM By: Karie Schwalbe RN Signed: 12/03/2022 12:26:35 PM By: Geralyn Corwin DO Entered By: Karie Schwalbe on 11/30/2022 14:05:57 -------------------------------------------------------------------------------- Problem List Details Patient Name: Date of Service: Duane Lope NDY L. 11/30/2022 8:00 A M Medical Record Number: [id number] Patient Account Number: [id number] Date of Birth/Sex: Treating RN: May 09, 1961 (62 y.o. M) Primary Care Provider: Margo Common, S Other Clinician: Referring Provider: Treating Provider/Extender: Freddi Che N-SIE, S Weeks in Treatment: 0 Active  Problems ICD-10 Encounter Code Description Active Date MDM Diagnosis L97.822 Non-pressure chronic ulcer of other part of left lower leg with fat layer exposed6/14/2024 No Yes L97.522 Non-pressure chronic ulcer of other part of left foot with fat layer exposed 11/30/2022 No Yes S31.829A Unspecified open wound of left buttock, initial encounter 11/30/2022 No Yes L98.492 Non-pressure chronic ulcer of skin of other sites with fat layer exposed 11/30/2022 No Yes Z89.611 Acquired absence of right leg above knee 11/30/2022 No Yes Z89.422 Acquired absence of other left toe(s) 11/30/2022 No Yes J44.9 Chronic obstructive pulmonary disease, unspecified 11/30/2022 No Yes E11.621 Type 2 diabetes mellitus with foot ulcer 11/30/2022 No Yes E11.622 Type 2 diabetes mellitus with other skin ulcer 11/30/2022 No Yes I89.0 Lymphedema, not elsewhere classified 11/30/2022 No Yes Inactive Problems Dudgeon, Earland L ([id number]) [phone].pdf Page 9 of 15 Resolved Problems Electronic Signature(s) Signed: 12/03/2022 12:26:35 PM By: Geralyn Corwin DO Entered By: Geralyn Corwin on 11/30/2022 11:13:59 -------------------------------------------------------------------------------- Progress Note Details Patient Name: Date of Service: Shea Evans, RA NDY L. 11/30/2022 8:00 A M Medical Record Number: [id number] Patient Account Number: [id number] Date of Birth/Sex: Treating RN: 09-24-1960 (62 y.o. M) Primary Care Provider: Jackolyn Confer Other Clinician: Referring Provider: Treating Provider/Extender: Freddi Che N-SIE, S Weeks in Treatment: 0 Subjective Chief Complaint Information obtained from Patient 11/30/2022; left lower extremity wounds, left buttocks wound History of Present Illness (HPI) 11/30/2022 Mr. 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 He has been using Medihoney to the wound beds. He resides at [facility]. He is not sure how the wound started but he does sit for long periods of time in his wheelchair and likely created wounds to his feet and buttocks due to this. The knee wound was likely traumatic. Currently denies systemic signs of infection. Patient History Information obtained from Chart. Allergies No Known Drug Allergies Family History Cancer - Father,Mother,Paternal Grandparents, Diabetes - Mother, Heart Disease - Mother. Social History Current every day smoker, Alcohol Use - Never, Drug Use - No History, Caffeine Use - Never. Medical History Hematologic/Lymphatic Patient has history of Lymphedema Respiratory Patient has history of Chronic Obstructive Pulmonary Disease (COPD) Cardiovascular Patient has history of Congestive Heart Failure, Hypertension, Peripheral Venous Disease Endocrine Patient has history of Type II Diabetes Musculoskeletal Patient has history of Osteomyelitis Patient is treated with Insulin. Blood sugar is tested. Hospitalization/Surgery History - right AKA 7 years ago. Medical A Surgical History Notes nd Cardiovascular pulmonary htn chronic venous htn hyperlipidemia Gastrointestinal GERD Genitourinary ckd III bph Musculoskeletal septic arthritis AKA- Right left great toe amputation Neurologic occipital stroke- several months ago Oncologic melonoma Psychiatric depression, anxiety Review of Systems (ROS) Zakrzewski, Geran L ([id number]) [id number] Page 10 of 15 Integumentary (Skin) Complains or has symptoms of Wounds - x3. Objective Constitutional respirations regular, non-labored and within target range for patient.. Vitals Time Taken: 8:30 AM, Height: 72 in, Source: Stated, Weight: 231 lbs, Source: Stated, BMI: 31.3,  Temperature: 98.3 F, Pulse: 69 bpm, Respiratory Rate: 20 breaths/min, Blood Pressure: 125/71 mmHg. Cardiovascular 2+ dorsalis pedis/posterior tibialis pulses. Psychiatric pleasant and cooperative. General Notes: Left lower extremity: Significant lymphedema with taut skin and edema skin changes. Open wound to the dorsal foot granulation tissue, lateral left foot wound pale granulation tissue. T the upper thigh there is skin breakdown. T the left buttocks there is a wound with granulation tissue. Overall no o o obvious signs of infection. Integumentary (Hair, Skin) Wound #4 status is Open. Original cause of wound was Gradually Appeared. The date acquired was: 09/17/2022. The wound is located on the Left Knee. The wound measures 1.2cm length x 1.8cm width x 0.4cm depth; 1.696cm^2 area and 0.679cm^3 volume. There is Fat Layer (Subcutaneous Tissue) exposed. There is no tunneling or undermining noted. There is a medium amount of serosanguineous drainage noted. There is small (1-33%) red granulation within the wound bed. There is a large (67-100%) amount of necrotic tissue within the wound bed including Eschar and Adherent Slough. Wound #5 status is Open. Original cause of wound was Gradually Appeared. The date acquired was: 08/17/2022. The wound is located on the Left,Lateral Toe Fifth. The wound measures 5.4cm length x 2.5cm width x 0.1cm depth; 10.603cm^2 area and 1.06cm^3 volume. There is Fat Layer (Subcutaneous Tissue) exposed. There is no tunneling or undermining noted. There is a medium amount of serosanguineous drainage noted. There is small (1-33%) pink, pale granulation within the wound bed. There is a large (67-100%) amount of necrotic tissue within the wound bed including Adherent Slough. Wound #6 status is Open. Original cause of wound was Gradually Appeared. The date acquired was: 07/16/2022. The wound is located on the Left,Dorsal Foot. The wound measures 0.5cm length x 2.1cm width x 0.4cm  depth; 0.825cm^2 area and 0.33cm^3 volume. There is Fat Layer (Subcutaneous Tissue) exposed. There is no tunneling or undermining noted. There  is a medium amount of serosanguineous drainage noted. There is medium (34-66%) pink granulation within the wound bed. There is a medium (34-66%) amount of necrotic tissue within the wound bed including Adherent Slough. The periwound skin appearance had no abnormalities noted for moisture. The periwound skin appearance had no abnormalities noted for color. The periwound skin appearance exhibited: Scarring. Periwound temperature was noted as No Abnormality. Wound #7 status is Open. Original cause of wound was Shear/Friction. The date acquired was: 07/16/2022. The wound is located on the Left,Lateral Upper Leg. The wound measures 2.2cm length x 0.5cm width x 0.1cm depth; 0.864cm^2 area and 0.086cm^3 volume. There is Fat Layer (Subcutaneous Tissue) exposed. There is no tunneling or undermining noted. There is a medium amount of serosanguineous drainage noted. There is large (67-100%) pink granulation within the wound bed. There is a small (1-33%) amount of necrotic tissue within the wound bed including Adherent Slough. The periwound skin appearance had no abnormalities noted for moisture. The periwound skin appearance had no abnormalities noted for color. The periwound skin appearance exhibited: Scarring. Periwound temperature was noted as No Abnormality. Wound #8 status is Open. Original cause of wound was Shear/Friction. The date acquired was: 07/16/2022. The wound is located on the Left Gluteus. The wound measures 1.3cm length x 1.1cm width x 0.1cm depth; 1.123cm^2 area and 0.112cm^3 volume. There is Fat Layer (Subcutaneous Tissue) exposed. There is no tunneling or undermining noted. There is a medium amount of serosanguineous drainage noted. There is large (67-100%) pink granulation within the wound bed. There is a small (1-33%) amount of necrotic tissue within  the wound bed including Adherent Slough. The periwound skin appearance had no abnormalities noted for moisture. The periwound skin appearance had no abnormalities noted for color. The periwound skin appearance exhibited: Scarring. Periwound temperature was noted as No Abnormality. 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 Wound healing is complicated by lymphedema and type 2 diabetes. At this time I recommended aggressive offloading to the foot and recommended a Prevalon boot. He declined this but states he will wear bunny boots. For dressings I recommended Hydrofera Blue and Medihoney. Also recommended aggressive offloading and repositioning while in his wheelchair. No signs of obvious infection. We cannot obtain ABIs and I recommended ABIs with arterial duplex to assure that he ABIJAH, KILDAY ([id number]) [phone].pdf Page 11 of 15 has adequate blood flow for healing. Follow-up in 2 weeks. Procedures Wound #4 Pre-procedure diagnosis of Wound #4 is a Lymphedema located on the Left Knee .Severity of Tissue Pre Debridement is: Fat layer exposed. There was a Chemical/Enzymatic/Mechanical debridement performed by Geralyn Corwin, DO. With the following instrument(s): Mechanical. Agent used was Other. A time  out was conducted at 09:23, prior to the start of the procedure. There was no bleeding. The procedure was tolerated well with a pain level of 0 throughout and a pain level of 0 following the procedure. Post  Debridement Measurements: 1.2cm length x 1.8cm width x 0.4cm depth; 0.679cm^3 volume. Character of Wound/Ulcer Post Debridement is improved. Severity of Tissue Post Debridement is: Fat layer exposed. Post procedure Diagnosis Wound #4: Same as Pre-Procedure General Notes: Scribed for Dr. Mikey Bussing by J.Scotton. Wound #5 Pre-procedure diagnosis of Wound #5 is a Diabetic Wound/Ulcer of the Lower Extremity located on the Left,Lateral T Fifth .Severity of Tissue Pre oe Debridement is: Fat layer exposed. There was a Chemical/Enzymatic/Mechanical debridement performed by Geralyn Corwin, DO. With the following instrument(s): Mechanical. Agent used was Other. A time out was conducted at 09:23, prior to the start of the procedure. There was no bleeding. The procedure was tolerated well with a pain level of 0 throughout and a pain level of 0 following the procedure. Post Debridement Measurements: 5.4cm length x 2.5cm width x 0.1cm depth; 1.06cm^3 volume. Character of Wound/Ulcer Post Debridement is improved. Severity of Tissue Post Debridement is: Fat layer exposed. Post procedure Diagnosis Wound #5: Same as Pre-Procedure General Notes: Scribed for Dr. Mikey Bussing by J.Scotton. Wound #6 Pre-procedure diagnosis of Wound #6 is a Diabetic Wound/Ulcer of the Lower Extremity located on the Left,Dorsal Foot .Severity of Tissue Pre Debridement is: Fat layer exposed. There was a Chemical/Enzymatic/Mechanical debridement performed by Geralyn Corwin, DO. With the following instrument(s): Mechanical. Agent used was Other. A time out was conducted at 09:23, prior to the start of the procedure. There was no bleeding. The procedure was tolerated well with a pain level of 0 throughout and a pain level of 0 following the  procedure. Post Debridement Measurements: 0.5cm length x 2.1cm width x 0.4cm depth; 0.33cm^3 volume. Character of Wound/Ulcer Post Debridement is improved. Severity of Tissue Post Debridement is: Fat layer exposed. Post procedure Diagnosis Wound #6: Same as Pre-Procedure General Notes: Scribed for Dr. Mikey Bussing by J.Scotton. Wound #7 Pre-procedure diagnosis of Wound #7 is a Lesion located on the Left,Lateral Upper Leg . There was a Chemical/Enzymatic/Mechanical debridement performed by Geralyn Corwin, DO. With the following instrument(s): Mechanical. Agent used was Other. A time out was conducted at 09:23, prior to the start of the procedure. There was no bleeding. The procedure was tolerated well with a pain level of 0 throughout and a pain level of 0 following the procedure. Post Debridement Measurements: 2.2cm length x 0.5cm width x 0.1cm depth; 0.086cm^3 volume. Character of Wound/Ulcer Post Debridement is improved. Post procedure Diagnosis Wound #7: Same as Pre-Procedure General Notes: Scribed for Dr. Mikey Bussing by J.Scotton. Wound #8 Pre-procedure diagnosis of Wound #8 is an Abrasion located on the Left Gluteus . There was a Chemical/Enzymatic/Mechanical debridement performed by Geralyn Corwin, DO. With the following instrument(s): Mechanical. Agent used was Other. A time out was conducted at 09:23, prior to the start of the procedure. There was no bleeding. The procedure was tolerated well with a pain level of 0 throughout and a pain level of 0 following the procedure. Post Debridement Measurements: 1.3cm length x 1.1cm width x 0.1cm depth; 0.112cm^3 volume. Character of Wound/Ulcer Post Debridement is improved. Post procedure Diagnosis Wound #8: Same as Pre-Procedure General Notes: Scribed for Dr. Mikey Bussing by J.Scotton. Plan Follow-up Appointments: Return Appointment in 2 weeks. - Dr. Mikey Bussing Room 9 Friday 28th July at 10:15am Anesthetic: Wound #4 Left Knee: (In clinic) Topical  Lidocaine 4% applied to wound bed Wound #5 Left,Lateral T Fifth: oe (In clinic) Topical Lidocaine 4% applied to wound bed Wound #6 Left,Dorsal Foot: (In clinic) Topical Lidocaine 4% applied to wound bed Wound #7 Left,Lateral Upper Leg: (In clinic)  Topical Lidocaine 4% applied to wound bed Wound #8 Left Gluteus: (In clinic) Topical Lidocaine 4% applied to wound bed Bathing/ Shower/ Hygiene: May shower and wash wound with soap and water. Edema Control - Lymphedema / SCD / Other: Elevate legs to the level of the heart or above for 30 minutes daily and/or when sitting for 3-4 times a day throughout the day. Moisturize legs daily. Off-Loading: Wound #6 Left,Dorsal Foot: Gel wheelchair cushion - Keep using either Gel cushion for wheelchair or Roho cushion for wheelchair Roho cushion for wheelchair - Keep using either Gel cushion for wheelchair or Roho cushion for wheelchair Other: - Wear Bunny Boot (left foot) Additional Orders / Instructions: Follow Nutritious Diet Consults ordered were: Vascular - Left leg/Left foot. Arterial Duplex and ABI and TBI WOUND #4: - Knee Wound Laterality: Left Cleanser: Soap and Water 1 x Per Day/30 Days Discharge Instructions: May shower and wash wound with dial antibacterial soap and water prior to dressing change. Cleanser: Vashe 5.8 (oz) 1 x Per Day/30 Days Discharge Instructions: Cleanse the wound with Vashe prior to applying a clean dressing using gauze sponges, not tissue or cotton balls. HADLEY, HANDRICH ([id number]) [phone].pdf Page 12 of 15 Cleanser: Wound Cleanser 1 x Per Day/30 Days Discharge Instructions: Cleanse the wound with wound cleanser prior to applying a clean dressing using gauze sponges, not tissue or cotton balls. Prim Dressing: Hydrofera Blue Ready Transfer Foam, 2.5x2.5 (in/in) 1 x Per Day/30 Days ary Discharge Instructions: Apply directly to wound bed as directed Prim Dressing: MediHoney Gel, tube 1.5 (oz) 1  x Per Day/30 Days ary Discharge Instructions: Apply to wound bed as instructed Secondary Dressing: Bordered Gauze, 2x3.75 in 1 x Per Day/30 Days Discharge Instructions: Apply over primary dressing as directed. WOUND #5: - T Fifth Wound Laterality: Left, Lateral oe Cleanser: Soap and Water 1 x Per Day/30 Days Discharge Instructions: May shower and wash wound with dial antibacterial soap and water prior to dressing change. Cleanser: Vashe 5.8 (oz) 1 x Per Day/30 Days Discharge Instructions: Cleanse the wound with Vashe prior to applying a clean dressing using gauze sponges, not tissue or cotton balls. Cleanser: Wound Cleanser 1 x Per Day/30 Days Discharge Instructions: Cleanse the wound with wound cleanser prior to applying a clean dressing using gauze sponges, not tissue or cotton balls. Prim Dressing: Hydrofera Blue Ready Transfer Foam, 4x5 (in/in) 1 x Per Day/30 Days ary Discharge Instructions: Apply to wound bed as instructed Prim Dressing: MediHoney Gel, tube 1.5 (oz) 1 x Per Day/30 Days ary Discharge Instructions: Apply to wound bed as instructed Secondary Dressing: Bordered Gauze, 4x4 in 1 x Per Day/30 Days Discharge Instructions: Apply over primary dressing as directed. WOUND #6: - Foot Wound Laterality: Dorsal, Left Cleanser: Soap and Water 1 x Per Day/30 Days Discharge Instructions: May shower and wash wound with dial antibacterial soap and water prior to dressing change. Cleanser: Vashe 5.8 (oz) 1 x Per Day/30 Days Discharge Instructions: Cleanse the wound with Vashe prior to applying a clean dressing using gauze sponges, not tissue or cotton balls. Cleanser: Wound Cleanser 1 x Per Day/30 Days Discharge Instructions: Cleanse the wound with wound cleanser prior to applying a clean dressing using gauze sponges, not tissue or cotton balls. Prim Dressing: Hydrofera Blue Ready Transfer Foam, 2.5x2.5 (in/in) 1 x Per Day/30 Days ary Discharge Instructions: Apply directly to wound bed  as directed Prim Dressing: MediHoney Gel, tube 1.5 (oz) 1 x Per Day/30 Days ary Discharge Instructions: Apply to wound bed as instructed  Secondary Dressing: Bordered Gauze, 2x3.75 in 1 x Per Day/30 Days Discharge Instructions: Apply over primary dressing as directed. WOUND #7: - Upper Leg Wound Laterality: Left, Lateral Cleanser: Soap and Water 1 x Per Day/30 Days Discharge Instructions: May shower and wash wound with dial antibacterial soap and water prior to dressing change. Cleanser: Vashe 5.8 (oz) 1 x Per Day/30 Days Discharge Instructions: Cleanse the wound with Vashe prior to applying a clean dressing using gauze sponges, not tissue or cotton balls. Cleanser: Wound Cleanser 1 x Per Day/30 Days Discharge Instructions: Cleanse the wound with wound cleanser prior to applying a clean dressing using gauze sponges, not tissue or cotton balls. Prim Dressing: Hydrofera Blue Ready Transfer Foam, 2.5x2.5 (in/in) 1 x Per Day/30 Days ary Discharge Instructions: Apply directly to wound bed as directed Secondary Dressing: Bordered Gauze, 2x2 in 1 x Per Day/30 Days Discharge Instructions: Apply over primary dressing as directed. WOUND #8: - Gluteus Wound Laterality: Left Cleanser: Soap and Water 1 x Per Day/30 Days Discharge Instructions: May shower and wash wound with dial antibacterial soap and water prior to dressing change. Cleanser: Vashe 5.8 (oz) 1 x Per Day/30 Days Discharge Instructions: Cleanse the wound with Vashe prior to applying a clean dressing using gauze sponges, not tissue or cotton balls. Cleanser: Wound Cleanser 1 x Per Day/30 Days Discharge Instructions: Cleanse the wound with wound cleanser prior to applying a clean dressing using gauze sponges, not tissue or cotton balls. Prim Dressing: Hydrofera Blue Ready Transfer Foam, 2.5x2.5 (in/in) 1 x Per Day/30 Days ary Discharge Instructions: Apply directly to wound bed as directed Secondary Dressing: Bordered Gauze, 2x2 in 1 x Per  Day/30 Days Discharge Instructions: Apply over primary dressing as directed. 1. Hydrofera Blue and Medihoney 2. Aggressive offloadingbunny boots, Roho cushion 3. ABIs with arterial duplex 4. Follow-up in 2 weeks Electronic Signature(s) Signed: 11/30/2022 5:08:12 PM By: Shawn Stall RN, BSN Signed: 12/03/2022 12:26:35 PM By: Geralyn Corwin DO Entered By: Shawn Stall on 11/30/2022 16:41:25 -------------------------------------------------------------------------------- HxROS Details Patient Name: Date of Service: Shea Evans, RA NDY L. 11/30/2022 8:00 A M Medical Record Number: [id number] Patient Account Number: [id number] Date of Birth/Sex: Treating RN: [birth year] (62 y.o. Cline Cools Primary Care Provider: Margo Common, S Other Clinician: Referring Provider: Treating Provider/Extender: Carey Bullocks, S Weeks in Treatment: 0 [place]andalen, [name] ([id number]) [id number] Page 13 of 15 Information Obtained From Chart Integumentary (Skin) Complaints and Symptoms: Positive for: Wounds - x3 Hematologic/Lymphatic Medical History: Positive for: Lymphedema Respiratory Medical History: Positive for: Chronic Obstructive Pulmonary Disease (COPD) Cardiovascular Medical History: Positive for: Congestive Heart Failure; Hypertension; Peripheral Venous Disease Past Medical History Notes: pulmonary htn chronic venous htn hyperlipidemia Gastrointestinal Medical History: Past Medical History Notes: GERD Endocrine Medical History: Positive for: Type II Diabetes Time with diabetes: 15 years Treated with: Insulin Blood sugar tested every day: Yes Tested : daily Genitourinary Medical History: Past Medical History Notes: ckd III bph Musculoskeletal Medical History: Positive for: Osteomyelitis Past Medical History Notes: septic arthritis AKA- Right left great toe amputation Neurologic Medical History: Past Medical History Notes: occipital  stroke- several months ago Oncologic Medical History: Past Medical History Notes: melonoma Psychiatric Medical History: Past Medical History Notes: depression, anxiety Immunizations Pneumococcal Vaccine: Received Pneumococcal VaccinationMIKING, COVIN ([id number]) [phone].pdf Page 14 of 15 Received Pneumococcal Vaccination On or After 60th Birthday: No Implantable Devices No devices added Hospitalization / Surgery History Type of Hospitalization/Surgery right AKA 7 years ago Family and Social History Cancer: Yes - Father,Mother,Paternal Grandparents;  Diabetes: Yes - Mother; Heart Disease: Yes - Mother; Current every day smoker; Alcohol Use: Never; Drug Use: No History; Caffeine Use: Never; Financial Concerns: No; Food, Clothing or Shelter Needs: No; Support System Lacking: No; Transportation Concerns: No Electronic Signature(s) Signed: 11/30/2022 12:15:24 PM By: Redmond Pulling RN, BSN Signed: 11/30/2022 2:16:10 PM By: Karie Schwalbe RN Signed: 12/03/2022 12:26:35 PM By: Geralyn Corwin DO Entered By: Karie Schwalbe on 11/30/2022 08:32:14 -------------------------------------------------------------------------------- SuperBill Details Patient Name: Date of Service: Gypsy Decant. 11/30/2022 Medical Record Number: [id number] Patient Account Number: [id number] Date of Birth/Sex: Treating RN: April 10, 1961 (63 y.o. M) Primary Care Provider: Margo Common, Kathie Rhodes Other Clinician: Referring Provider: Treating Provider/Extender: Freddi Che N-SIE, S Weeks in Treatment: 0 Diagnosis Coding ICD-10 Codes Code Description 7247052137 Non-pressure chronic ulcer of other part of left lower leg with fat layer exposed L97.522 Non-pressure chronic ulcer of other part of left foot with fat layer exposed S31.829A Unspecified open wound of left buttock, initial encounter L98.492 Non-pressure chronic ulcer of skin of other sites with fat layer exposed Z89.611  Acquired absence of right leg above knee Z89.422 Acquired absence of other left toe(s) J44.9 Chronic obstructive pulmonary disease, unspecified E11.621 Type 2 diabetes mellitus with foot ulcer E11.622 Type 2 diabetes mellitus with other skin ulcer I89.0 Lymphedema, not elsewhere classified Facility Procedures : CPT4 Code: 91478295 Description: 99213 - WOUND CARE VISIT-LEV 3 EST PT Modifier: 25 Quantity: 1 : CPT4 Code: 62130865 Description: 78469 - DEBRIDE W/O ANES NON SELECT Modifier: Quantity: 1 Physician Procedures Electronic Signature(s) Signed: 11/30/2022 2:16:10 PM By: Karie Schwalbe RN Signed: 12/03/2022 12:26:35 PM By: Geralyn Corwin DO Entered By: Karie Schwalbe on 11/30/2022 12:29:15

## 2022-12-03 NOTE — Progress Notes (Signed)
Pharmacy Antibiotic Note  Gregory [name] is a 62 y.o. male admitted on 12/03/2022 with severe abdominal pain with concerns for sepsis.  Pharmacy has been consulted for zosyn and vancomycin dosing. WBC 28.7, sCr 3.3 (bl ~2-2.5), lactate 8.6, Tmax 101.7  Plan: Vancomycin 2g IV x1 per MD Vancomycin 1250 mg IV every 48 hours (AUC 441, Vd 0.5, sCr 3.3) Zosyn 3.375 g IV every 8 hours Monitor renal function for dose change of vancomycin Follow up WBC, fever curve, signs of clinical improvement, and de-escalation of antibiotics    Temp (24hrs), Avg:101.3 F (38.5 C), Min:99.9 F (37.7 C), Max:101.7 F (38.7 C)  Recent Labs  Lab 12/03/22 1654 12/03/22 1715 12/03/22 1920 12/03/22 2040 12/03/22 2240  WBC 28.7*  --   --   --   --   CREATININE 3.38* 3.30*  --   --   --   LATICACIDVEN 3.1*  --  4.1* 6.1* 8.6*    Estimated Creatinine Clearance: 29.6 mL/min (A) (by C-G formula based on SCr of 3.3 mg/dL (H)).    Allergies  Allergen Reactions   Latex Other (See Comments)    "ALLERGIC," per MAR    Antimicrobials this admission: Zosyn 6/17 >>  Vancomycin 6/17 >>   Microbiology results: 6/17 BCx:  6/17 UCx:    Thank you for allowing pharmacy to be a part of this patient's care.  Arabella Merles, PharmD. [facility] Acute Care PGY-1 12/03/2022 11:42 PM

## 2022-12-03 NOTE — ED Notes (Signed)
Patient was incontinent to stool. Patient was cleaned and new linens were provided. Patient states he has abdominal pain 10/10 that started yesterday.

## 2022-12-03 NOTE — Sepsis Progress Note (Signed)
Elink monitoring for the code sepsis protocol.  

## 2022-12-03 NOTE — ED Provider Notes (Signed)
EMERGENCY DEPARTMENT AT [facility] Provider Note   HPI: Gregory [name] is a 62 year old male with a past medical history as below presenting today due to concerns of abdominal pain.  The patient is not reportedly on oxygen and has a new oxygen requirement according to EMS.  They report the patient was complaining of urinary retention however has been making urine.  EMS report the patient has been at 93% on 4 L.  He has been hemodynamically stable with them.  Did not give any medications prior to arrival.  Patient endorses shortness of breath without chest pain.  He endorses generalized abdominal pain.  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    Chronic pain syndrome    COPD (chronic obstructive pulmonary disease) (HCC)    DDD (degenerative disc disease), lumbar    Depression    major depressive disorder    Diabetes mellitus without complication (HCC)    type 2    Diabetic ulcer of toe of left foot (HCC) 10/02/2016   Difficult intubation    Difficulty in walking, not elsewhere classified    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   Foot drop, bilateral    Foot drop, right 12/27/2015   GERD (gastroesophageal reflux disease)    Herpesviral vesicular dermatitis    History of COVID-19 03/15/2022   Hyperlipidemia    Hyperlipidemia    Hypertension    Insomnia    Malignant melanoma of other parts of face (HCC)    Melanoma (HCC)    MRSA bacteremia    Muscle weakness (generalized)     Nasal congestion    Necrosis of toe (HCC) 07/08/2018   Neuromuscular dysfunction of bladder    Osteomyelitis (HCC)    Osteomyelitis (HCC)    Osteomyelitis (HCC)    right BKA   Other idiopathic peripheral autonomic neuropathy    Retention of urine, unspecified    Squamous cell carcinoma of skin    Stroke (HCC)    Unsteadiness on feet    Urinary retention    Urinary retention    Urinary tract infection    Wears dentures    Wears glasses     Past Surgical History:  Procedure Laterality Date   AMPUTATION Left 03/29/2017   Procedure: LEFT GREAT TOE AMPUTATION AT METATARSOPHALANGEAL JOINT;  Surgeon: Nadara Mustard, MD;  Location: [facility] OR;  Service: Orthopedics;  Laterality: Left;   AMPUTATION Right 03/29/2017   Procedure: RIGHT BELOW KNEE AMPUTATION;  Surgeon: Nadara Mustard, MD;  Location: [facility] OR;  Service: Orthopedics;  Laterality: Right;   AMPUTATION Left 06/06/2018   Procedure: LEFT 2ND TOE AMPUTATION;  Surgeon: Nadara Mustard, MD;  Location: [facility] OR;  Service: Orthopedics;  Laterality: Left;   AMPUTATION Right 11/19/2018   Procedure: AMPUTATION ABOVE KNEE;  Surgeon: Nadara Mustard, MD;  Location: [facility] OR;  Service: Orthopedics;  Laterality: Right;  ANTERIOR CERVICAL CORPECTOMY N/A 11/25/2015   Procedure: ANTERIOR CERVICAL FIVE CORPECTOMY Cervical four - six fusion;  Surgeon: Lisbeth Renshaw, MD;  Location: [facility];  Service: Neurosurgery;  Laterality: N/A;  ANTERIOR CERVICAL FIVE CORPECTOMY Cervical four - six fusion   APPLICATION OF WOUND VAC Right 10/22/2018   Procedure: Application Of Wound Vac;  Surgeon: Nadara Mustard, MD;  Location: [facility] OR;  Service: Orthopedics;  Laterality: Right;   CYSTOSCOPY WITH BIOPSY N/A 05/01/2022   Procedure: CYSTOSCOPY WITH URETHRAL BIOPSY;  Surgeon: Noel Christmas, MD;  Location: [facility] ORS;  Service: Urology;  Laterality: N/A;  1 HR   CYSTOSCOPY WITH RETROGRADE URETHROGRAM N/A 04/25/2018   Procedure: CYSTOSCOPY WITH RETROGRADE URETHROGRAM/ BALLOON  DILATION;  Surgeon: Ihor Gully, MD;  Location: [facility] ORS;  Service: Urology;  Laterality: N/A;   CYSTOSCOPY WITH URETHRAL DILATATION N/A 05/01/2022   Procedure: BALLOON DILATION WITH  OPTILUME;  Surgeon: Noel Christmas, MD;  Location: [facility] ORS;  Service: Urology;  Laterality: N/A;   MULTIPLE TOOTH EXTRACTIONS     POSTERIOR CERVICAL FUSION/FORAMINOTOMY N/A 11/29/2015   Procedure: Cervical Three-Cervical Seven Posterior Cervical Laminectomy with Fusion;  Surgeon: Lisbeth Renshaw, MD;  Location: [facility];  Service: Neurosurgery;  Laterality: N/A;  Cervical Three-Cervical Seven Posterior Cervical Laminectomy with Fusion   STUMP REVISION Right 10/22/2018   Procedure: REVISION RIGHT BELOW KNEE AMPUTATION;  Surgeon: Nadara Mustard, MD;  Location: [facility] OR;  Service: Orthopedics;  Laterality: Right;   STUMP REVISION Right 12/05/2018   Procedure: Revision Right Above Knee Amputation;  Surgeon: Nadara Mustard, MD;  Location: [facility] OR;  Service: Orthopedics;  Laterality: Right;   STUMP REVISION Right 05/29/2019   Procedure: REVISION RIGHT ABOVE KNEE AMPUTATION;  Surgeon: Nadara Mustard, MD;  Location: [facility] OR;  Service: Orthopedics;  Laterality: Right;   STUMP REVISION Right 06/24/2019   Procedure: STUMP REVISION;  Surgeon: Nadara Mustard, MD;  Location: [facility] OR;  Service: Orthopedics;  Laterality: Right;     Social History   Tobacco Use   Smoking status: Every Day    Packs/day: .5    Types: Cigarettes   Smokeless tobacco: Never   Tobacco comments:    11/19/18  Vaping Use   Vaping Use: Never used  Substance Use Topics   Alcohol use: No   Drug use: No      Review of Systems  A complete ROS was performed with pertinent positives/negatives noted in the HPI.   Vitals:   12/03/22 1640 12/03/22 1645  BP: 125/79   Pulse: (!) 130 (!) 132  SpO2: (!) 88% 93%    Physical Exam Constitutional:      General: He is in acute distress (due to pain).     Appearance: He is ill-appearing.  Cardiovascular:      Rate and Rhythm: Regular rhythm. Tachycardia present.     Pulses: Normal pulses.     Heart sounds: Normal heart sounds. No murmur heard.    No friction rub. No gallop.  Pulmonary:     Effort: Pulmonary effort is normal. Tachypnea present.     Breath sounds: No stridor. Rales present. No wheezing.  Abdominal:     General: There is distension.     Palpations: Abdomen is soft.     Tenderness: There is generalized abdominal tenderness. There is no guarding or rebound.  Neurological:     Mental Status: He is alert.     Procedures  MDM:  Imaging/radiology results:  No results found.  EKG results:    Lab results:  No results found for this or any previous visit (from the past 24 hour(s)).  Key medications administered in the ER:  Medications - No data to display  Consults: ***    Click here for ABCD2, HEART and other calculatorsREFRESH Note before signing   Medical decision making: -Vital signs show tachycardia, tachypnea, and hypoxia stable on supplemental oxygen. Patient febrile and ill-appearing.  Vital signs are concerning for sepsis therefore sepsis protocol initiated including blood cultures, urine culture/UA, lactic acid, fluids, and empiric antibiotics. -Patient's presentation is most consistent with acute presentation with potential threat to life or bodily function.. Gregory [name] is a 62 y.o. male presenting to the emergency department with .  -Additional history obtained from *** -Per chart review, ***   Medical Decision Making Amount and/or Complexity of Data Reviewed Labs: ordered. Radiology: ordered. ECG/medicine tests: ordered.  Risk OTC drugs. Prescription drug management.     The plan for this patient was discussed with Dr. ***, who voiced agreement and who oversaw evaluation and treatment of this patient.  Marta Lamas, MD Emergency Medicine, PGY-3  Note: Dragon medical dictation software was used in the creation of this note.    Clinical Impression: No diagnosis found.  Rx / DC Orders ED Discharge Orders     None

## 2022-12-03 NOTE — ED Provider Notes (Signed)
Contrast-induced nephropathy is a controversial topic. The 2020 consensus statement from the [employer] of Radiology and [employer] Use of Intravenous Iodinated Contrast Media in Patients with Kidney Disease: Consensus Statements from the [employer] of Radiology and the [employer] states: "The risk of acute kidney injury (AKI) developing in patients with reduced kidney function following exposure to intravenous iodinated contrast media has been overstated." The statement further states. "If contrast media administration is required for a life-threatening diagnosis, then it should not be withheld based on kidney function."  I have reviewed the labs and believe that the risk-benefit analysis favors using contrast when obtaining the CT imaging.   Micael Hampshire MS, [place] MA, Loyal Gambler, Dillman JR, Fine D, McDonald RJ, Rodby RA, Aleene Davidson. Use of Intravenous Iodinated Contrast Media in Patients with Kidney Disease: Consensus Statements from the [employer] of Radiology and the [employer]. Radiology. 2020 Mar;294(3):660-668. doi: 10.1148/radiol.1610960454. Epub 2020 Jan 21. PMID: 09811914.      Wilhelm-Leen et al, Estimating the Risk of Radiocontrast-Associated Nephropathy, [place] [employer] Soc Nephrol 78/ [phone], 2017  Jaclyn Shaggy et al: Intravenous Contrast Material Exposure Is Not an Independent Risk Factor for Dialysis or Mortality,  Radiology: Vol. 273: #3--Dec 2014  Hinson JS et al: Risk of Acute Kidney Injury After Intravenous Contrast Media Administration,  Ann Emerg Med,   Fyffe RD et al: Acute Kidney Injury After Computed Tomography: A Meta-analysis. [place] [facility] Med. 2017 Aug 12.   Cathren Laine, Editorial/ Contrast-associated acute kidney injury is a myth: Yes, Intensive Care Med (2018) [phone]  After discussions with Dr. Dorothey Baseman with radiology given the patient's acute illness, we will proceed with CT scans with contrast.      Chase Caller, MD 12/03/22 1742    Blane Ohara, MD 12/06/22 [id number]

## 2022-12-03 NOTE — H&P (Signed)
NAME:  Gregory [name], MRN:  [id number], DOB:  02-01-1961, LOS: 0 ADMISSION DATE:  12/03/2022, CONSULTATION DATE:  12/03/2022 REFERRING MD:  EDP, CHIEF COMPLAINT:  abdominal pain   History of Present Illness:  Gregory [name] is a 62 y.o. man with COPD on home oxygen and DM2 s/p RLE AKA who is a SNF resident at [facility] with frequent admissions to the hospital. Here with abdominal pain, and urinary incontinence. He reports excruciating abdominal pain that started earlier today, along with constipation recently.   Pertinent  Medical History  S/p RLE AKA DM2 COPD on home oxygen  Significant Hospital Events: Including procedures, antibiotic start and stop dates in addition to other pertinent events     Interim History / Subjective:    Objective   Blood pressure (!) 113/98, pulse 95, temperature (!) 101.5 F (38.6 C), resp. rate (!) 25, SpO2 91 %.        Intake/Output Summary (Last 24 hours) at 12/03/2022 2338 Last data filed at 12/03/2022 1858 Gross per 24 hour  Intake 1000 ml  Output 225 ml  Net 775 ml   There were no vitals filed for this visit.  Examination: General: chronically ill appearing, in respiratory distress HENT: on oxygen, dry mucus  membranes Lungs: shallow respirations, diminished, no wheeze Cardiovascular: tachycardic, regular Abdomen: obese, distended, hypoactive bowel sounds with tenderness to minimal palpation. No rebound tenderness, or guarding Extremities: RLE AKA Neuro: normal speech, oriented to situation, place, time.  GU: foley in place  Clean catch UA +WBC and bacteria  CT A/P with stool burden and ileus without obstruction CTPE negative for PE  WBC 28.7 with bandemia  Echo April 2024 - LVEF preserved, no substantial RV strain or valvular disease. Grade 1 diastolic dysfunction  Resolved Hospital Problem list     Assessment & Plan:  Severe sepsis secondary to UTI vs other intra-abdominal source. Constipation. At risk for mesenteric  ischemia.  - broad spectrum abx vanc/zosyn for now. Recent culture data reviewed shows mostly growing strep species - surgery consulted, currently recommending NPO with serial abdominal exams with enema.  - blood, urine cx sent - will admit to ICU for closer monitoring and potential for decompensation given rising lactic acid and intractable symptoms - pain control as able  Anion Gap Metabolic Acidosis Secondary to elevated lactic acid AKI on CKD Stage 3B - s/p 3L fluid resuscitation, likely will benefit from additional IVF  Acute on chronic hypoxemic respiratory failure COPD, stage unknown - Likely secondary to sepsis, atelectasis,mucoid impaction - prn duonebs  Type 2 DM - NPO, monitor CBGs with Ssi for now. Hold home meds  BPH - holding flomax, may need to resume during this admission  Neuropathy - holding home meds for now given AKI  PVD HTN HLD Holding metoprolol, statin, asa for now  Best Practice (right click and "Reselect all SmartList Selections" daily)   Diet/type: NPO DVT prophylaxis: prophylactic heparin  GI prophylaxis: N/A Lines: N/A Foley:  Yes, and it is still needed Code Status:  full code Last date of multidisciplinary goals of care discussion []   Labs   CBC: Recent Labs  Lab 12/03/22 1654 12/03/22 1715  WBC 28.7*  --   NEUTROABS 25.6*  --   HGB 14.4 16.0  HCT 47.3 47.0  MCV 90.8  --   PLT 500*  --     Basic Metabolic Panel: Recent Labs  Lab 12/03/22 1654 12/03/22 1715  NA 133* 133*  K 4.8 4.7  CL  98 105  CO2 15*  --   GLUCOSE 269* 277*  BUN 51* 48*  CREATININE 3.38* 3.30*  CALCIUM 9.3  --    GFR: Estimated Creatinine Clearance: 29.6 mL/min (A) (by C-G formula based on SCr of 3.3 mg/dL (H)). Recent Labs  Lab 12/03/22 1654 12/03/22 1920 12/03/22 2040 12/03/22 2240  WBC 28.7*  --   --   --   LATICACIDVEN 3.1* 4.1* 6.1* 8.6*    Liver Function Tests: Recent Labs  Lab 12/03/22 1654  AST 24  ALT 18  ALKPHOS 78   BILITOT 1.0  PROT 8.4*  ALBUMIN 3.5   Recent Labs  Lab 12/03/22 1654  LIPASE 21   No results for input(s): "AMMONIA" in the last 168 hours.  ABG    Component Value Date/Time   PHART 7.407 08/22/2022 1314   PCO2ART 33.7 08/22/2022 1314   PO2ART 159 (H) 08/22/2022 1314   HCO3 26.9 09/13/2022 0924   TCO2 18 (L) 12/03/2022 1715   ACIDBASEDEF 3.0 (H) 08/22/2022 1314   O2SAT 90 09/13/2022 0924     Coagulation Profile: Recent Labs  Lab 12/03/22 1654  INR 1.1    Cardiac Enzymes: No results for input(s): "CKTOTAL", "CKMB", "CKMBINDEX", "TROPONINI" in the last 168 hours.  HbA1C: Hemoglobin A1C  Date/Time Value Ref Range Status  08/14/2016 12:00 AM 5.6  Final  01/18/2016 12:00 AM 5.5  Final   Hgb A1c MFr Bld  Date/Time Value Ref Range Status  05/01/2022 08:13 AM 6.9 (H) 4.8 - 5.6 % Final    Comment:    (NOTE) Pre diabetes:          5.7%-6.4%  Diabetes:              >6.4%  Glycemic control for   <7.0% adults with diabetes   09/06/2021 07:40 AM 7.5 (H) 4.8 - 5.6 % Final    Comment:    (NOTE) Pre diabetes:          5.7%-6.4%  Diabetes:              >6.4%  Glycemic control for   <7.0% adults with diabetes     CBG: No results for input(s): "GLUCAP" in the last 168 hours.  Review of Systems:   Abdominal pain,  constipation  Past Medical History:  He,  has a past medical history of Acquired absence of left great toe [facility]), Acquired absence of right leg below knee [facility]), Acute osteomyelitis of calcaneum, right (HCC) (10/02/2016), Acute respiratory failure (HCC), Amputation stump infection (HCC) (08/12/2018), Anemia, Basal cell carcinoma, eyelid, Cancer (HCC), Candidiasis (08/12/2018), CHF (congestive heart failure) (HCC), Chronic back pain, Chronic kidney disease, Chronic multifocal osteomyelitis (HCC), Chronic pain syndrome, COPD (chronic obstructive pulmonary disease) (HCC), DDD (degenerative disc disease), lumbar, Depression, Diabetes mellitus without  complication (HCC), Diabetic ulcer of toe of left foot (HCC) (10/02/2016), Difficult intubation, Difficulty in walking, not elsewhere classified, 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), Foot drop, bilateral, Foot drop, right (12/27/2015), GERD (gastroesophageal reflux disease), Herpesviral vesicular dermatitis, History of COVID-19 (03/15/2022), Hyperlipidemia, Hyperlipidemia, Hypertension, Insomnia, Malignant melanoma of other parts of face (HCC), Melanoma (HCC), MRSA bacteremia, Muscle weakness (generalized), Nasal congestion, Necrosis of toe (HCC) (07/08/2018), Neuromuscular dysfunction of bladder, Osteomyelitis (HCC), Osteomyelitis (HCC), Osteomyelitis (HCC), Other idiopathic peripheral autonomic neuropathy, Retention of urine, unspecified, Squamous cell carcinoma of skin, Stroke (HCC), Unsteadiness on feet, Urinary retention, Urinary retention, Urinary tract infection, Wears dentures, and Wears glasses.   Surgical History:   Past Surgical History:  Procedure Laterality Date  AMPUTATION Left 03/29/2017   Procedure: LEFT GREAT TOE AMPUTATION AT METATARSOPHALANGEAL JOINT;  Surgeon: Nadara Mustard, MD;  Location: [facility] OR;  Service: Orthopedics;  Laterality: Left;   AMPUTATION Right 03/29/2017   Procedure: RIGHT BELOW KNEE AMPUTATION;  Surgeon: Nadara Mustard, MD;  Location: [facility] OR;  Service: Orthopedics;  Laterality: Right;   AMPUTATION Left 06/06/2018   Procedure: LEFT 2ND TOE AMPUTATION;  Surgeon: Nadara Mustard, MD;  Location: [facility] OR;  Service: Orthopedics;  Laterality: Left;   AMPUTATION Right 11/19/2018   Procedure: AMPUTATION ABOVE KNEE;  Surgeon: Nadara Mustard, MD;  Location: [facility] OR;  Service: Orthopedics;  Laterality: Right;   ANTERIOR CERVICAL CORPECTOMY N/A 11/25/2015   Procedure: ANTERIOR CERVICAL FIVE CORPECTOMY Cervical four - six fusion;  Surgeon: Lisbeth Renshaw, MD;  Location: [facility];  Service: Neurosurgery;  Laterality: N/A;  ANTERIOR CERVICAL FIVE CORPECTOMY Cervical four - six fusion    APPLICATION OF WOUND VAC Right 10/22/2018   Procedure: Application Of Wound Vac;  Surgeon: Nadara Mustard, MD;  Location: [facility] OR;  Service: Orthopedics;  Laterality: Right;   CYSTOSCOPY WITH BIOPSY N/A 05/01/2022   Procedure: CYSTOSCOPY WITH URETHRAL BIOPSY;  Surgeon: Noel Christmas, MD;  Location: [facility] ORS;  Service: Urology;  Laterality: N/A;  1 HR   CYSTOSCOPY WITH RETROGRADE URETHROGRAM N/A 04/25/2018   Procedure: CYSTOSCOPY WITH RETROGRADE URETHROGRAM/ BALLOON DILATION;  Surgeon: Ihor Gully, MD;  Location: [facility] ORS;  Service: Urology;  Laterality: N/A;   CYSTOSCOPY WITH URETHRAL DILATATION N/A 05/01/2022   Procedure: BALLOON DILATION WITH  OPTILUME;  Surgeon: Noel Christmas, MD;  Location: [facility] ORS;  Service: Urology;  Laterality: N/A;   MULTIPLE TOOTH EXTRACTIONS     POSTERIOR CERVICAL FUSION/FORAMINOTOMY N/A 11/29/2015   Procedure: Cervical Three-Cervical Seven Posterior Cervical Laminectomy with Fusion;  Surgeon: Lisbeth Renshaw, MD;  Location: [facility];  Service: Neurosurgery;  Laterality: N/A;  Cervical Three-Cervical Seven Posterior Cervical Laminectomy with Fusion   STUMP REVISION Right 10/22/2018   Procedure: REVISION RIGHT BELOW KNEE AMPUTATION;  Surgeon: Nadara Mustard, MD;  Location: [facility] OR;  Service: Orthopedics;  Laterality: Right;   STUMP REVISION Right 12/05/2018   Procedure: Revision Right Above Knee Amputation;  Surgeon: Nadara Mustard, MD;  Location: [facility] OR;  Service: Orthopedics;  Laterality: Right;   STUMP REVISION Right 05/29/2019   Procedure: REVISION RIGHT ABOVE KNEE AMPUTATION;  Surgeon: Nadara Mustard, MD;  Location: [facility] OR;  Service: Orthopedics;  Laterality: Right;   STUMP REVISION Right 06/24/2019   Procedure: STUMP REVISION;  Surgeon: Nadara Mustard, MD;  Location: [facility] OR;  Service: Orthopedics;  Laterality: Right;     Social History:   reports that he has been smoking cigarettes. He has been smoking an average of .5 packs per day. He has never used smokeless  tobacco. He reports that he does not drink alcohol and does not use drugs.   Family History:  His family history includes Bone cancer in his father; Cancer in his father, mother, paternal grandfather, and another family member; Diabetes in his mother and another family member; Heart disease in his mother; Prostate cancer in his father.   Allergies Allergies  Allergen Reactions   Latex Other (See Comments)    "ALLERGIC," per [facility]     Home Medications  Prior to Admission medications   Medication Sig Start Date End Date Taking? Authorizing Provider  aspirin EC 81 MG tablet Take 81 mg by mouth daily. Swallow whole.  [provider]  atorvastatin (LIPITOR) 80 MG tablet Take 1 tablet (80 mg total) by mouth daily. 09/22/22 01/27/24  Jerald Kief, MD  cetirizine (ZYRTEC) 10 MG tablet Take 10 mg by mouth daily.    [provider]  Cholecalciferol 25 MCG (1000 UT) capsule Take 1,000 Units by mouth daily.    [provider]  Cranberry 450 MG CAPS Take 1 tablet by mouth daily.    [provider]  cyanocobalamin 1000 MCG tablet Take 1,000 mcg by mouth daily.    [provider]  Dextran 70-Hypromellose (NATURAL BALANCE TEARS) 0.1-0.3 % SOLN Apply 1 drop to eye every 4 (four) hours as needed (dry eyes).    [provider]  docusate sodium (COLACE) 100 MG capsule Take 1 capsule (100 mg total) by mouth 2 (two) times daily. 05/31/19   Amin, Loura Halt, MD  escitalopram (LEXAPRO) 20 MG tablet Take 20 mg by mouth daily.    [provider]  fenofibrate 54 MG tablet Take 1 tablet (54 mg total) by mouth daily. 09/22/22 01/28/24  Jerald Kief, MD  ferrous sulfate 325 (65 FE) MG tablet Take 1 tablet (325 mg total) by mouth daily with breakfast. 11/23/18   Mirian Mo, MD  fluticasone [facility]) 50 MCG/ACT nasal spray Place 1 spray into both nostrils in the morning.    [provider]  hydrOXYzine (ATARAX) 25 MG tablet Take 25 mg by mouth  every 6 (six) hours as needed.    [provider]  insulin glargine (LANTUS SOLOSTAR) 100 UNIT/ML Solostar Pen Inject 15 Units into the skin daily. Patient taking differently: Inject 15 Units into the skin at bedtime. 09/07/22   Rai, Ripudeep K, MD  insulin lispro (HUMALOG) 100 UNIT/ML KwikPen Inject 0-10 Units into the skin See admin instructions. Inject 0-10 units into the skin three times a day and at bedtime, PER SLIDING SCALE:  BGL 151-200 = 2 units 201-250 = 4 units 251-300 = 6 units 301-350 =  8 units 351-400 = 10 units Patient taking differently: Inject 0-10 Units into the skin See admin instructions. Inject 0-10 units into the skin before meals and at bedtime, PER SLIDING SCALE:  BGL 151-200 = 2 units 201-250 = 4 units 251-300 = 6 units 301-350 =  8 units 351-400 = 10 units 01/31/22   Dahal, Melina Schools, MD  ipratropium-albuterol (DUONEB) 0.5-2.5 (3) MG/3ML SOLN Take 3 mLs by nebulization every 6 (six) hours as needed (for shortness of breath).    [provider]  lactulose (CHRONULAC) 10 GM/15ML solution Take 15 mLs (10 g total) by mouth 3 (three) times daily as needed for moderate constipation. Patient taking differently: Take 10 g by mouth every 8 (eight) hours as needed for moderate constipation. 01/31/22   Lorin Glass, MD  magnesium oxide (MAG-OX) 400 MG tablet Take 400 mg by mouth 2 (two) times daily.    [provider]  melatonin 3 MG TABS tablet Take 3 mg by mouth at bedtime.    [provider]  metoprolol tartrate (LOPRESSOR) 25 MG tablet Take 0.5 tablets (12.5 mg total) by mouth 2 (two) times daily. 07/18/19 01/27/24  Zigmund Daniel., MD  mirabegron ER (MYRBETRIQ) 50 MG TB24 tablet Take 50 mg by mouth daily.    [provider]  naloxone [facility]) 4 MG/0.1ML LIQD nasal spray kit Place 1 spray into the nose as needed (poorly responsive or turning blue).    [provider]  omeprazole (PRILOSEC) 20 MG capsule  Take 20 mg by  mouth at bedtime.    [provider]  ondansetron (ZOFRAN) 4 MG tablet Take 4 mg by mouth every 8 (eight) hours as needed for nausea.    [provider]  Oxycodone HCl 20 MG TABS Take 1 tablet (20 mg total) by mouth every 8 (eight) hours as needed (severe pain). 09/21/22   Jerald Kief, MD  phenazopyridine (PYRIDIUM) 100 MG tablet Take 100 mg by mouth every 8 (eight) hours as needed for pain.    [provider]  polyethylene glycol (MIRALAX / GLYCOLAX) 17 g packet Take 17 g by mouth daily as needed for mild constipation.    [provider]  pregabalin (LYRICA) 100 MG capsule Take 100 mg by mouth 2 (two) times daily.    [provider]  prochlorperazine (COMPAZINE) 10 MG tablet Take 10 mg by mouth every 6 (six) hours as needed for nausea or vomiting.    [provider]  QUEtiapine (SEROQUEL) 100 MG tablet Take 100 mg by mouth at bedtime.    [provider]  sennosides-docusate sodium (SENOKOT-S) 8.6-50 MG tablet Take 1 tablet by mouth at bedtime.    [provider]  tamsulosin (FLOMAX) 0.4 MG CAPS capsule Take 0.4 mg by mouth daily. 11/26/13   [provider]  trolamine salicylate (ASPERCREME) 10 % cream Apply 1 Application topically every 6 (six) hours as needed for muscle pain.    [provider]  TRULICITY 0.75 MG/0.5ML SOPN Inject 0.75 mg into the skin every Wednesday.    [provider]     Critical care time: 61    The patient is critically ill due to lactic acidosis, sepsis.  Critical care was necessary to treat or prevent imminent or life-threatening deterioration.  Critical care was time spent personally by me on the following activities: development of treatment plan with patient and/or surrogate as well as nursing, discussions with consultants, evaluation of patient's response to treatment, examination of patient, obtaining history from patient or surrogate, ordering and performing treatments  and interventions, ordering and review of laboratory studies, ordering and review of radiographic studies, pulse oximetry, re-evaluation of patient's condition and participation in multidisciplinary rounds.   Critical Care Time devoted to patient care services described in this note is 45 minutes. This time reflects time of care of this signee Charlott Holler . This critical care time does not reflect separately billable procedures or procedure time, teaching time or supervisory time of PA/NP/Med student/Med Resident etc but could involve care discussion time.       Charlott Holler [facility] 12/03/2022 11:46 PM  Pager: see AMION  If no response to pager , please call critical care on call (see AMION) until 7pm After 7:00 pm call Elink

## 2022-12-03 NOTE — Progress Notes (Signed)
Gregory [name] ([id number]) [phone].pdf Page 1 of 14 Visit Report for 11/30/2022 Allergy List Details Patient Name: Date of Service: Gregory [name] 11/30/2022 8:00 A M Medical Record Number: [id number] Patient Account Number: [id number] Date of Birth/Sex: Treating RN: 11-16-1960 (62 y.o. Gregory [name] Primary Care Iban Utz: Margo Common, S Other Clinician: Referring Toryn Mcclinton: Treating Hensley Treat/Extender: Freddi Che N-SIE, S Weeks in Treatment: 0 Allergies Active Allergies No Known Drug Allergies Allergy Notes Electronic Signature(s) Signed: 11/30/2022 2:16:10 PM By: Karie Schwalbe RN Entered By: Karie Schwalbe on 11/30/2022 08:26:51 -------------------------------------------------------------------------------- Arrival Information Details Patient Name: Date of Service: Gregory [name] [name] 11/30/2022 8:00 A M Medical Record Number: [id number] Patient Account Number: [id number] Date of Birth/Sex: Treating RN: 17-Oct-1960 (62 y.o. Gregory [name] Primary Care Chia Rock: Margo Common, S Other Clinician: Referring Megon Kalina: Treating Faraz Ponciano/Extender: Freddi Che N-SIE, S Weeks in Treatment: 0 Visit Information Patient Arrived: Wheel Chair Arrival Time: 08:15 Accompanied By: self Transfer Assistance: None Patient Identification Verified: Yes Secondary Verification Process Completed: Yes Patient Requires Transmission-Based Precautions: No Patient Has Alerts: No History Since Last Visit Added or deleted any medications: No Any new allergies or adverse reactions: No Had a fall or experienced change in activities of daily living that may affect risk of falls: No Signs or symptoms of abuse/neglect since last visito No Hospitalized since last visit: No Implantable device outside of the clinic excluding cellular tissue based products placed in the center since last visit: No Has Dressing in Place as Prescribed: No Pain Present Now:  Yes Electronic Signature(s) Signed: 11/30/2022 2:16:10 PM By: Karie Schwalbe RN Entered By: Karie Schwalbe on 11/30/2022 32:95:18 Gregory [name] ([id number]) [id number].pdf Page 2 of 14 -------------------------------------------------------------------------------- Clinic Level of Care Assessment Details Patient Name: Date of Service: Gregory [name], Gregory [name] 11/30/2022 8:00 A M Medical Record Number: [id number] Patient Account Number: [id number] Date of Birth/Sex: Treating RN: [birth year] (62 y.o. Gregory [name] Primary Care Kaiulani Sitton: Margo Common, S Other Clinician: Referring Aadam Zhen: Treating Barre Aydelott/Extender: Freddi Che N-SIE, S Weeks in Treatment: 0 Clinic Level of Care Assessment Items TOOL 1 Quantity Score X- 1 0 Use when EandM and Procedure is performed on INITIAL visit ASSESSMENTS - Nursing Assessment / Reassessment X- 1 20 General Physical Exam (combine w/ comprehensive assessment (listed just below) when performed on new pt. evals) X- 1 25 Comprehensive Assessment (HX, ROS, Risk Assessments, Wounds Hx, etc.) ASSESSMENTS - Wound and Skin Assessment / Reassessment X- 1 10 Dermatologic / Skin Assessment (not related to wound area) ASSESSMENTS - Ostomy and/or Continence Assessment and Care []  - 0 Incontinence Assessment and Management []  - 0 Ostomy Care Assessment and Management (repouching, etc.) PROCESS - Coordination of Care X - Simple Patient / Family Education for ongoing care 1 15 []  - 0 Complex (extensive) Patient / Family Education for ongoing care X- 1 10 Staff obtains Chiropractor, Records, T Results / Process Orders est X- 1 10 Staff telephones HHA, Nursing Homes / Clarify orders / etc []  - 0 Routine Transfer to another Facility (non-emergent condition) []  - 0 Routine Hospital Admission (non-emergent condition) X- 1 15 New Admissions / Manufacturing engineer / Ordering NPWT Apligraf, etc. , []  - 0 Emergency Hospital  Admission (emergent condition) PROCESS - Special Needs []  - 0 Pediatric / Minor Patient Management []  - 0 Isolation Patient Management []  - 0 Hearing / Language / Visual special needs []  - 0 Assessment of Community assistance (transportation, D/C planning, etc.) []  - 0 Additional assistance / Altered mentation []  -  0 Support Surface(s) Assessment (bed, cushion, seat, etc.) INTERVENTIONS - Miscellaneous []  - 0 External ear exam []  - 0 Patient Transfer (multiple staff / Nurse, adult / Similar devices) []  - 0 Simple Staple / Suture removal (25 or less) []  - 0 Complex Staple / Suture removal (26 or more) []  - 0 Hypo/Hyperglycemic Management (do not check if billed separately) []  - 0 Ankle / Brachial Index (ABI) - do not check if billed separately Has the patient been seen at the hospital within the last three years: Yes Total Score: 105 Level Of Care: New/Established - Level 3 Electronic Signature(s) Signed: 11/30/2022 2:16:10 PM By: Karie Schwalbe RN Entered By: Karie Schwalbe on 11/30/2022 12:29:02 Gregory [name] ([id number]) [id number] Page 3 of 14 -------------------------------------------------------------------------------- Encounter Discharge Information Details Patient Name: Date of Service: Gregory [name] 11/30/2022 8:00 A M Medical Record Number: [id number] Patient Account Number: [id number] Date of Birth/Sex: Treating RN: [birth year] (62 y.o. Gregory [name] Primary Care Calandra Madura: Margo Common, S Other Clinician: Referring Vishaal Strollo: Treating Angelynn Lemus/Extender: Freddi Che N-SIE, S Weeks in Treatment: 0 Encounter Discharge Information Items Post Procedure Vitals Discharge Condition: Stable Temperature (F): 98.3 Ambulatory Status: Wheelchair Pulse (bpm): 69 Discharge Destination: Home Respiratory Rate (breaths/min): 20 Transportation: Private Auto Blood Pressure (mmHg): 125/71 Accompanied By: self Schedule Follow-up  Appointment: Yes Clinical Summary of Care: Patient Declined Electronic Signature(s) Signed: 11/30/2022 2:16:10 PM By: Karie Schwalbe RN Entered By: Karie Schwalbe on 11/30/2022 12:30:46 -------------------------------------------------------------------------------- Lower Extremity Assessment Details Patient Name: Date of Service: NICKALOUS, BARBA. 11/30/2022 8:00 A M Medical Record Number: [id number] Patient Account Number: [id number] Date of Birth/Sex: Treating RN: June 14, 1961 (62 y.o. Gregory [name] Primary Care Drianna Chandran: Margo Common, S Other Clinician: Referring Jadore Veals: Treating Kani Chauvin/Extender: Freddi Che N-SIE, S Weeks in Treatment: 0 Edema Assessment Assessed: [Left: Yes] [Right: No] Edema: [Left: Ye] [Right: s] Calf Left: Right: Point of Measurement: From Medial Instep 51.4 cm Ankle Left: Right: Point of Measurement: From Medial Instep 35 cm Vascular Assessment Pulses: Dorsalis Pedis Palpable: [Left:No] Posterior Tibial Palpable: [Left:Yes] Electronic Signature(s) Signed: 11/30/2022 2:16:10 PM By: Karie Schwalbe RN Entered By: Karie Schwalbe on 11/30/2022 08:41:54 Hegel, Orey [name] ([id number]) [id number] Page 4 of 14 -------------------------------------------------------------------------------- Multi Wound Chart Details Patient Name: Date of Service: Gregory [name], Gregory [name] 11/30/2022 8:00 A M Medical Record Number: [id number] Patient Account Number: [id number] Date of Birth/Sex: Treating RN: December 22, 1960 (62 y.o. M) Primary Care Demetri Goshert: Margo Common, S Other Clinician: Referring Kevona Lupinacci: Treating Aiyden Lauderback/Extender: Freddi Che N-SIE, S Weeks in Treatment: 0 Vital Signs Height(in): 72 Pulse(bpm): 69 Weight(lbs): 231 Blood Pressure(mmHg): 125/71 Body Mass Index(BMI): 31.3 Temperature(F): 98.3 Respiratory Rate(breaths/min): 20 [4:Photos:] Left Knee Left, Lateral T Fifth oe Left, Dorsal Foot Wound  Location: Gradually Appeared Gradually Appeared Gradually Appeared Wounding Event: Lymphedema Lymphedema Diabetic Wound/Ulcer of the Lower Primary Etiology: Extremity Diabetic Wound/Ulcer of the Lower Diabetic Wound/Ulcer of the Lower N/A Secondary Etiology: Extremity Extremity Lymphedema, Chronic Obstructive Lymphedema, Chronic Obstructive Lymphedema, Chronic Obstructive Comorbid History: Pulmonary Disease (COPD), Pulmonary Disease (COPD), Pulmonary Disease (COPD), Congestive Heart Failure, Congestive Heart Failure, Congestive Heart Failure, Hypertension, Peripheral Venous Hypertension, Peripheral Venous Hypertension, Peripheral Venous Disease, Type II Diabetes, Disease, Type II Diabetes, Disease, Type II Diabetes, Osteomyelitis Osteomyelitis Osteomyelitis 09/17/2022 08/17/2022 07/16/2022 Date Acquired: 0 0 0 Weeks of Treatment: Open Open Open Wound Status: No No No Wound Recurrence: 1.2x1.8x0.4 5.4x2.5x0.1 0.5x2.1x0.4 Measurements [name] x W x D (cm) 1.696 10.603 0.825 A (cm) : rea 0.679 1.06 0.33 Volume (cm) : Full  Thickness Without Exposed Full Thickness Without Exposed Grade 2 Classification: Support Structures Support Structures Medium Medium Medium Exudate A mount: Serosanguineous Serosanguineous Serosanguineous Exudate Type: red, brown red, brown red, brown Exudate Color: Small (1-33%) Small (1-33%) Medium (34-66%) Granulation A mount: Red Pink, Pale Pink Granulation Quality: Large (67-100%) Large (67-100%) Medium (34-66%) Necrotic A mount: Eschar, Adherent Slough Adherent [employer] Necrotic Tissue: Fat Layer (Subcutaneous Tissue): Yes Fat Layer (Subcutaneous Tissue): Yes Fat Layer (Subcutaneous Tissue): Yes Exposed Structures: Fascia: No Fascia: No Fascia: No Tendon: No Tendon: No Tendon: No Muscle: No Muscle: No Muscle: No Joint: No Joint: No Joint: No Bone: No Bone: No Bone: No None None Small  (1-33%) Epithelialization: Chemical/Enzymatic/Mechanical Chemical/Enzymatic/Mechanical Chemical/Enzymatic/Mechanical Debridement: Pre-procedure Verification/Time Out 09:23 09:23 09:23 Taken: Other(Mechanical) Other(Mechanical) Other(Mechanical) Instrument: None None None Bleeding: 0 0 0 Procedural Pain: 0 0 0 Post Procedural Pain: Procedure was tolerated well Procedure was tolerated well Procedure was tolerated well Debridement Treatment Response: 1.2x1.8x0.4 5.4x2.5x0.1 0.5x2.1x0.4 Post Debridement Measurements [name] x W x D (cm) 0.679 1.06 0.33 Post Debridement Volume: (cm) Scarring: Yes Periwound Skin Texture: No Abnormalities Noted Periwound Skin Moisture: JAVARIUS, SLAVEY ([id number]) [phone].pdf Page 5 of 14 No Abnormalities Noted Periwound Skin Color: N/A N/A No Abnormality Temperature: Debridement Debridement Debridement Procedures Performed: Wound Number: 7 8 N/A Photos: N/A Left, Lateral Upper Leg Left Gluteus N/A Wound Location: Shear/Friction Shear/Friction N/A Wounding Event: Lesion Lesion N/A Primary Etiology: N/A N/A N/A Secondary Etiology: Lymphedema, Chronic Obstructive Lymphedema, Chronic Obstructive N/A Comorbid History: Pulmonary Disease (COPD), Pulmonary Disease (COPD), Congestive Heart Failure, Congestive Heart Failure, Hypertension, Peripheral Venous Hypertension, Peripheral Venous Disease, Type II Diabetes, Disease, Type II Diabetes, Osteomyelitis Osteomyelitis 07/16/2022 07/16/2022 N/A Date Acquired: 0 0 N/A Weeks of Treatment: Open Open N/A Wound Status: No No N/A Wound Recurrence: 2.2x0.5x0.1 1.3x1.1x0.1 N/A Measurements [name] x W x D (cm) 0.864 1.123 N/A A (cm) : rea 0.086 0.112 N/A Volume (cm) : Full Thickness Without Exposed Full Thickness Without Exposed N/A Classification: Support Structures Support Structures Medium Medium N/A Exudate A mount: Serosanguineous Serosanguineous N/A Exudate Type: red,  brown red, brown N/A Exudate Color: Large (67-100%) Large (67-100%) N/A Granulation A mount: Pink Pink N/A Granulation Quality: Small (1-33%) Small (1-33%) N/A Necrotic A mount: Adherent Slough Adherent Slough N/A Necrotic Tissue: Fat Layer (Subcutaneous Tissue): Yes Fat Layer (Subcutaneous Tissue): Yes N/A Exposed Structures: Fascia: No Fascia: No Tendon: No Tendon: No Muscle: No Muscle: No Joint: No Joint: No Bone: No Bone: No Small (1-33%) Small (1-33%) N/A Epithelialization: Chemical/Enzymatic/Mechanical Chemical/Enzymatic/Mechanical N/A Debridement: Pre-procedure Verification/Time Out 09:23 09:23 N/A Taken: Other(Mechanical) Other(Mechanical) N/A Instrument: None None N/A Bleeding: 0 0 N/A Procedural Pain: 0 0 N/A Post Procedural Pain: Procedure was tolerated well Procedure was tolerated well N/A Debridement Treatment Response: 2.2x0.5x0.1 1.3x1.1x0.1 N/A Post Debridement Measurements [name] x W x D (cm) 0.086 0.112 N/A Post Debridement Volume: (cm) Scarring: Yes Scarring: Yes N/A Periwound Skin Texture: No Abnormalities Noted No Abnormalities Noted N/A Periwound Skin Moisture: No Abnormalities Noted No Abnormalities Noted N/A Periwound Skin Color: No Abnormality No Abnormality N/A Temperature: Debridement Debridement N/A Procedures Performed: Treatment Notes Electronic Signature(s) Signed: 12/03/2022 12:26:35 PM By: Geralyn Corwin DO Entered By: Geralyn Corwin on 11/30/2022 11:14:06 Multi-Disciplinary Care Plan Details -------------------------------------------------------------------------------- Gregory [name] ([id number]) [id number] Page 6 of 14 Patient Name: Date of Service: Gregory [name], Gregory [name] 11/30/2022 8:00 A M Medical Record Number: [id number] Patient Account Number: [id number] Date of Birth/Sex: Treating RN: [birth year] (62 y.o. Gregory [name] Primary Care Pristine Gladhill: Bernadene Person  N-SIE, S Other Clinician: Referring  Robson Trickey: Treating Thresa Dozier/Extender: Freddi Che N-SIE, S Weeks in Treatment: 0 Active Inactive Wound/Skin Impairment Nursing Diagnoses: Impaired tissue integrity Goals: Patient/caregiver will verbalize understanding of skin care regimen Date Initiated: 11/30/2022 Target Resolution Date: 02/16/2023 Goal Status: Active Interventions: Assess ulceration(s) every visit Treatment Activities: Skin care regimen initiated : 11/30/2022 Notes: Electronic Signature(s) Signed: 11/30/2022 2:16:10 PM By: Karie Schwalbe RN Entered By: Karie Schwalbe on 11/30/2022 12:25:22 -------------------------------------------------------------------------------- Pain Assessment Details Patient Name: Date of Service: Gregory [name] [name] 11/30/2022 8:00 A M Medical Record Number: [id number] Patient Account Number: [id number] Date of Birth/Sex: Treating RN: [birth year] (62 y.o. Gregory [name] Primary Care Mickal Meno: Margo Common, S Other Clinician: Referring Astria Jordahl: Treating Ledonna Dormer/Extender: Freddi Che N-SIE, S Weeks in Treatment: 0 Active Problems Location of Pain Severity and Description of Pain Patient Has Paino Yes Site Locations Pain Location: Pain in Ulcers With Dressing Change: No Duration of the Pain. Constant / Intermittento Constant Rate the pain. Current Pain Level: 6 Worst Pain Level: 10 Least Pain Level: 3 Tolerable Pain Level: 6 Character of Pain Describe the Pain: Difficult to Pinpoint KEEF, Havier [name] ([id number]) [phone].pdf Page 7 of 14 Pain Management and Medication Current Pain Management: Medication: Yes Cold Application: No Rest: Yes Massage: No Activity: No T.E.N.S.: No Heat Application: No Leg drop or elevation: No Is the Current Pain Management Adequate: Adequate How does your wound impact your activities of daily livingo Sleep: No Bathing: No Appetite: No Relationship With Others: No Bladder Continence:  No Emotions: No Bowel Continence: No Work: No Toileting: No Drive: No Dressing: No Hobbies: No Electronic Signature(s) Signed: 11/30/2022 2:16:10 PM By: Karie Schwalbe RN Entered By: Karie Schwalbe on 11/30/2022 08:58:04 -------------------------------------------------------------------------------- Patient/Caregiver Education Details Patient Name: Date of Service: Frediani, Gregory [name] [name] 6/14/2024andnbsp8:00 A M Medical Record Number: [id number] Patient Account Number: [id number] Date of Birth/Gender: Treating RN: 1961/02/17 (62 y.o. Gregory [name] Primary Care Physician: Margo Common, S Other Clinician: Referring Physician: Treating Physician/Extender: Carey Bullocks, S Weeks in Treatment: 0 Education Assessment Education Provided To: Patient Education Topics Provided Wound/Skin Impairment: Methods: Explain/Verbal Responses: Return demonstration correctly Electronic Signature(s) Signed: 11/30/2022 2:16:10 PM By: Karie Schwalbe RN Entered By: Karie Schwalbe on 11/30/2022 12:25:32 -------------------------------------------------------------------------------- Wound Assessment Details Patient Name: Date of Service: Gregory [name] [name] 11/30/2022 8:00 A M Medical Record Number: [id number] Patient Account Number: [id number] Date of Birth/Sex: Treating RN: [birth year] (62 y.o. Gregory [name] Primary Care Alban Marucci: Margo Common, S Other Clinician: Referring Charlottie Peragine: Treating Zuzanna Maroney/Extender: Freddi Che N-SIE, S Weeks in Treatment: 0 Wound Status Wound Number: 4 Primary Lymphedema Etiology: Gregory [name], Gregory [name] ([id number]) [phone].pdf Page 8 of 14 Etiology: Wound Location: Left Knee Secondary Diabetic Wound/Ulcer of the Lower Extremity Wounding Event: Gradually Appeared Etiology: Date Acquired: 09/17/2022 Wound Open Weeks Of Treatment: 0 Status: Clustered Wound: No Comorbid Lymphedema, Chronic Obstructive Pulmonary Disease  (COPD), History: Congestive Heart Failure, Hypertension, Peripheral Venous Disease, Type II Diabetes, Osteomyelitis Photos Wound Measurements Length: (cm) 1.2 Width: (cm) 1.8 Depth: (cm) 0.4 Area: (cm) 1.696 Volume: (cm) 0.679 % Reduction in Area: % Reduction in Volume: Epithelialization: None Tunneling: No Undermining: No Wound Description Classification: Full Thickness Without Exposed Support Structures Exudate Amount: Medium Exudate Type: Serosanguineous Exudate Color: red, brown Foul Odor After Cleansing: No Slough/Fibrino Yes Wound Bed Granulation Amount: Small (1-33%) Exposed Structure Granulation Quality: Red Fascia Exposed: No Necrotic Amount: Large (67-100%) Fat Layer (Subcutaneous Tissue) Exposed: Yes Necrotic Quality: Eschar, Adherent Slough Tendon Exposed: No Muscle  Exposed: No Joint Exposed: No Bone Exposed: No Periwound Skin Texture Texture Color No Abnormalities Noted: No No Abnormalities Noted: No Moisture No Abnormalities Noted: No Treatment Notes Wound #4 (Knee) Wound Laterality: Left Cleanser Soap and Water Discharge Instruction: May shower and wash wound with dial antibacterial soap and water prior to dressing change. Vashe 5.8 (oz) Discharge Instruction: Cleanse the wound with Vashe prior to applying a clean dressing using gauze sponges, not tissue or cotton balls. Wound Cleanser Discharge Instruction: Cleanse the wound with wound cleanser prior to applying a clean dressing using gauze sponges, not tissue or cotton balls. Peri-Wound Care Topical Primary Dressing Hydrofera Blue Ready Transfer Foam, 2.5x2.5 (in/in) Discharge Instruction: Apply directly to wound bed as directed MediHoney Gel, tube 1.5 (oz) Discharge Instruction: Apply to wound bed as instructed Gregory [name], Gregory [name] ([id number]) [phone].pdf Page 9 of 14 Secondary Dressing Bordered Gauze, 2x3.75 in Discharge Instruction: Apply over primary dressing as  directed. Secured With Compression Wrap Compression Stockings Facilities manager) Signed: 11/30/2022 12:15:24 PM By: Redmond Pulling RN, BSN Entered By: Redmond Pulling on 11/30/2022 08:33:19 -------------------------------------------------------------------------------- Wound Assessment Details Patient Name: Date of Service: Gregory [name] [name] 11/30/2022 8:00 A M Medical Record Number: [id number] Patient Account Number: [id number] Date of Birth/Sex: Treating RN: 11-16-1960 (62 y.o. Gregory [name] Primary Care Sheryn Aldaz: Margo Common, S Other Clinician: Referring Thalya Fouche: Treating Tawny Raspberry/Extender: Freddi Che N-SIE, S Weeks in Treatment: 0 Wound Status Wound Number: 5 Primary Lymphedema Etiology: Wound Location: Left, Lateral T Fifth oe Secondary Diabetic Wound/Ulcer of the Lower Extremity Wounding Event: Gradually Appeared Etiology: Date Acquired: 08/17/2022 Wound Open Weeks Of Treatment: 0 Status: Clustered Wound: No Comorbid Lymphedema, Chronic Obstructive Pulmonary Disease (COPD), History: Congestive Heart Failure, Hypertension, Peripheral Venous Disease, Type II Diabetes, Osteomyelitis Photos Wound Measurements Length: (cm) 5.4 Width: (cm) 2.5 Depth: (cm) 0.1 Area: (cm) 10.603 Volume: (cm) 1.06 % Reduction in Area: % Reduction in Volume: Epithelialization: None Tunneling: No Undermining: No Wound Description Classification: Full Thickness Without Exposed Suppor Exudate Amount: Medium Exudate Type: Serosanguineous Exudate Color: red, brown t Structures Foul Odor After Cleansing: No Slough/Fibrino Yes Wound Bed Granulation Amount: Small (1-33%) Exposed Structure Granulation Quality: Pink, Pale Fascia Exposed: No Necrotic Amount: Large (67-100%) Fat Layer (Subcutaneous Tissue) Exposed: Yes Gregory [name], Gregory [name] ([id number]) [id number] Page 10 of 14 Necrotic Quality: Adherent Slough Tendon Exposed: No Muscle Exposed:  No Joint Exposed: No Bone Exposed: No Periwound Skin Texture Texture Color No Abnormalities Noted: No No Abnormalities Noted: No Moisture No Abnormalities Noted: No Electronic Signature(s) Signed: 11/30/2022 12:15:24 PM By: Redmond Pulling RN, BSN Entered By: Redmond Pulling on 11/30/2022 08:36:29 -------------------------------------------------------------------------------- Wound Assessment Details Patient Name: Date of Service: Gregory [name] [name] 11/30/2022 8:00 A M Medical Record Number: [id number] Patient Account Number: [id number] Date of Birth/Sex: Treating RN: 1961-01-29 (62 y.o. Gregory [name] Primary Care Cathan Gearin: Margo Common, S Other Clinician: Referring Tiffiny Worthy: Treating Deshawn Skelley/Extender: Freddi Che N-SIE, S Weeks in Treatment: 0 Wound Status Wound Number: 6 Primary Diabetic Wound/Ulcer of the Lower Extremity Etiology: Wound Location: Left, Dorsal Foot Wound Open Wounding Event: Gradually Appeared Status: Date Acquired: 07/16/2022 Comorbid Lymphedema, Chronic Obstructive Pulmonary Disease (COPD), Weeks Of Treatment: 0 History: Congestive Heart Failure, Hypertension, Peripheral Venous Clustered Wound: No Disease, Type II Diabetes, Osteomyelitis Photos Wound Measurements Length: (cm) 0.5 Width: (cm) 2.1 Depth: (cm) 0.4 Area: (cm) 0.825 Volume: (cm) 0.33 % Reduction in Area: % Reduction in Volume: Epithelialization: Small (1-33%) Tunneling: No Undermining: No Wound Description Classification: Grade 2 Exudate Amount: Medium  Exudate Type: Serosanguineous Exudate Color: red, brown Foul Odor After Cleansing: No Slough/Fibrino Yes Wound Bed Granulation Amount: Medium (34-66%) Exposed Structure Granulation Quality: Pink Fascia Exposed: No Necrotic Amount: Medium (34-66%) Fat Layer (Subcutaneous Tissue) Exposed: Yes Necrotic Quality: Adherent Slough Tendon Exposed: No Gregory [name], Gregory [name] ([id number]) [phone].pdf Page 11 of  14 Muscle Exposed: No Joint Exposed: No Bone Exposed: No Periwound Skin Texture Texture Color No Abnormalities Noted: No No Abnormalities Noted: Yes Scarring: Yes Temperature / Pain Temperature: No Abnormality Moisture No Abnormalities Noted: Yes Treatment Notes Wound #6 (Foot) Wound Laterality: Dorsal, Left Cleanser Soap and Water Discharge Instruction: May shower and wash wound with dial antibacterial soap and water prior to dressing change. Vashe 5.8 (oz) Discharge Instruction: Cleanse the wound with Vashe prior to applying a clean dressing using gauze sponges, not tissue or cotton balls. Wound Cleanser Discharge Instruction: Cleanse the wound with wound cleanser prior to applying a clean dressing using gauze sponges, not tissue or cotton balls. Peri-Wound Care Topical Primary Dressing Hydrofera Blue Ready Transfer Foam, 2.5x2.5 (in/in) Discharge Instruction: Apply directly to wound bed as directed MediHoney Gel, tube 1.5 (oz) Discharge Instruction: Apply to wound bed as instructed Secondary Dressing Bordered Gauze, 2x3.75 in Discharge Instruction: Apply over primary dressing as directed. Secured With Compression Wrap Compression Stockings Facilities manager) Signed: 11/30/2022 12:15:24 PM By: Redmond Pulling RN, BSN Signed: 11/30/2022 2:16:10 PM By: Karie Schwalbe RN Entered By: Redmond Pulling on 11/30/2022 08:44:45 -------------------------------------------------------------------------------- Wound Assessment Details Patient Name: Date of Service: Gregory [name] [name] 11/30/2022 8:00 A M Medical Record Number: [id number] Patient Account Number: [id number] Date of Birth/Sex: Treating RN: 02/15/1961 (62 y.o. Gregory [name] Primary Care Jarrett Chicoine: Margo Common, S Other Clinician: Referring Navi Erber: Treating Anjuli Gemmill/Extender: Freddi Che N-SIE, S Weeks in Treatment: 0 Wound Status Wound Number: 7 Primary Lesion Etiology: Wound Location: Left,  Lateral Upper Leg Wound Open Wounding Event: Shear/Friction Status: Date Acquired: 07/16/2022 Comorbid Lymphedema, Chronic Obstructive Pulmonary Disease (COPD), Weeks Of Treatment: 0 History: Congestive Heart Failure, Hypertension, Peripheral Venous Clustered Wound: No Disease, Type II Diabetes, Osteomyelitis Gregory [name], Gregory [name] ([id number]) [id number] Page 12 of 14 Photos Wound Measurements Length: (cm) 2.2 Width: (cm) 0.5 Depth: (cm) 0.1 Area: (cm) 0.864 Volume: (cm) 0.086 % Reduction in Area: % Reduction in Volume: Epithelialization: Small (1-33%) Tunneling: No Undermining: No Wound Description Classification: Full Thickness Without Exposed Support Structures Exudate Amount: Medium Exudate Type: Serosanguineous Exudate Color: red, brown Foul Odor After Cleansing: No Slough/Fibrino Yes Wound Bed Granulation Amount: Large (67-100%) Exposed Structure Granulation Quality: Pink Fascia Exposed: No Necrotic Amount: Small (1-33%) Fat Layer (Subcutaneous Tissue) Exposed: Yes Necrotic Quality: Adherent Slough Tendon Exposed: No Muscle Exposed: No Joint Exposed: No Bone Exposed: No Periwound Skin Texture Texture Color No Abnormalities Noted: No No Abnormalities Noted: Yes Scarring: Yes Temperature / Pain Temperature: No Abnormality Moisture No Abnormalities Noted: Yes Treatment Notes Wound #7 (Upper Leg) Wound Laterality: Left, Lateral Cleanser Soap and Water Discharge Instruction: May shower and wash wound with dial antibacterial soap and water prior to dressing change. Vashe 5.8 (oz) Discharge Instruction: Cleanse the wound with Vashe prior to applying a clean dressing using gauze sponges, not tissue or cotton balls. Wound Cleanser Discharge Instruction: Cleanse the wound with wound cleanser prior to applying a clean dressing using gauze sponges, not tissue or cotton balls. Peri-Wound Care Topical Primary Dressing Hydrofera Blue Ready Transfer  Foam, 2.5x2.5 (in/in) Discharge Instruction: Apply directly to wound bed as directed Secondary Dressing Bordered Gauze, 2x2 in Discharge Instruction: Apply  over primary dressing as directed. Secured With Compression Wrap Compression Stockings Gregory [name], Gregory [name] ([id number]) [id number] Page 13 of 14 Add-Ons Electronic Signature(s) Signed: 11/30/2022 2:16:10 PM By: Karie Schwalbe RN Entered By: Karie Schwalbe on 11/30/2022 08:55:46 -------------------------------------------------------------------------------- Wound Assessment Details Patient Name: Date of Service: Gregory [name], Gregory [name] 11/30/2022 8:00 A M Medical Record Number: [id number] Patient Account Number: [id number] Date of Birth/Sex: Treating RN: May 06, 1961 (62 y.o. Gregory [name] Primary Care Bradlee Heitman: Margo Common, S Other Clinician: Referring Conswella Bruney: Treating Darla Mcdonald/Extender: Freddi Che N-SIE, S Weeks in Treatment: 0 Wound Status Wound Number: 8 Primary Lesion Etiology: Wound Location: Left Gluteus Wound Open Wounding Event: Shear/Friction Status: Date Acquired: 07/16/2022 Comorbid Lymphedema, Chronic Obstructive Pulmonary Disease (COPD), Weeks Of Treatment: 0 History: Congestive Heart Failure, Hypertension, Peripheral Venous Clustered Wound: No Disease, Type II Diabetes, Osteomyelitis Photos Wound Measurements Length: (cm) 1.3 Width: (cm) 1.1 Depth: (cm) 0.1 Area: (cm) 1.123 Volume: (cm) 0.112 % Reduction in Area: % Reduction in Volume: Epithelialization: Small (1-33%) Tunneling: No Undermining: No Wound Description Classification: Full Thickness Without Exposed Suppor Exudate Amount: Medium Exudate Type: Serosanguineous Exudate Color: red, brown t Structures Foul Odor After Cleansing: No Slough/Fibrino Yes Wound Bed Granulation Amount: Large (67-100%) Exposed Structure Granulation Quality: Pink Fascia Exposed: No Necrotic Amount: Small (1-33%) Fat Layer  (Subcutaneous Tissue) Exposed: Yes Necrotic Quality: Adherent Slough Tendon Exposed: No Muscle Exposed: No Joint Exposed: No Bone Exposed: No Periwound Skin Texture Texture Color No Abnormalities Noted: No No Abnormalities Noted: Yes Scarring: Yes Temperature / Pain Temperature: No Abnormality Moisture No Abnormalities Noted: Yes Porche, Jancarlo [name] ([id number]) [id number] Page 14 of 14 Electronic Signature(s) Signed: 11/30/2022 2:16:10 PM By: Karie Schwalbe RN Entered By: Karie Schwalbe on 11/30/2022 08:56:29 -------------------------------------------------------------------------------- Vitals Details Patient Name: Date of Service: Gregory [name], Gregory [name] [name] 11/30/2022 8:00 A M Medical Record Number: [id number] Patient Account Number: [id number] Date of Birth/Sex: Treating RN: [birth year] (62 y.o. Gregory [name] Primary Care Jany Buckwalter: Margo Common, S Other Clinician: Referring Eliska Hamil: Treating Chuck Caban/Extender: Freddi Che N-SIE, S Weeks in Treatment: 0 Vital Signs Time Taken: 08:30 Temperature (F): 98.3 Height (in): 72 Pulse (bpm): 69 Source: Stated Respiratory Rate (breaths/min): 20 Weight (lbs): 231 Blood Pressure (mmHg): 125/71 Source: Stated Reference Range: 80 - 120 mg / dl Body Mass Index (BMI): 31.3 Electronic Signature(s) Signed: 11/30/2022 2:16:10 PM By: Karie Schwalbe RN Entered By: Karie Schwalbe on 11/30/2022 08:31:09

## 2022-12-03 NOTE — Consult Note (Addendum)
Reason for Consult:abdominal pain Referring Provider: Cephus Richer  Gregory [name] is an 62 y.o. male.  HPI: 62 yo male with 1 day of abdominal pain. Pain is throughout his abdomen. He had a bowel movement earlier today. He denies nausea or vomiting. He denies blood in his stools.  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    Chronic pain syndrome    COPD (chronic obstructive pulmonary disease) (HCC)    DDD (degenerative disc disease), lumbar    Depression    major depressive disorder    Diabetes mellitus without complication (HCC)    type 2    Diabetic ulcer of toe of left foot (HCC) 10/02/2016   Difficult intubation    Difficulty in walking, not elsewhere classified    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   Foot drop, bilateral    Foot drop, right 12/27/2015   GERD (gastroesophageal reflux disease)    Herpesviral vesicular dermatitis    History of COVID-19 03/15/2022   Hyperlipidemia    Hyperlipidemia    Hypertension    Insomnia    Malignant melanoma of other parts of face (HCC)    Melanoma (HCC)    MRSA bacteremia    Muscle weakness (generalized)    Nasal congestion    Necrosis of toe (HCC) 07/08/2018   Neuromuscular dysfunction of bladder    Osteomyelitis (HCC)    Osteomyelitis (HCC)    Osteomyelitis (HCC)    right BKA   Other idiopathic peripheral autonomic neuropathy    Retention of urine, unspecified    Squamous cell carcinoma of skin    Stroke (HCC)    Unsteadiness on feet     Urinary retention    Urinary retention    Urinary tract infection    Wears dentures    Wears glasses     Past Surgical History:  Procedure Laterality Date   AMPUTATION Left 03/29/2017   Procedure: LEFT GREAT TOE AMPUTATION AT METATARSOPHALANGEAL JOINT;  Surgeon: Nadara Mustard, MD;  Location: [facility] OR;  Service: Orthopedics;  Laterality: Left;   AMPUTATION Right 03/29/2017   Procedure: RIGHT BELOW KNEE AMPUTATION;  Surgeon: Nadara Mustard, MD;  Location: [facility] OR;  Service: Orthopedics;  Laterality: Right;   AMPUTATION Left 06/06/2018   Procedure: LEFT 2ND TOE AMPUTATION;  Surgeon: Nadara Mustard, MD;  Location: [facility] OR;  Service: Orthopedics;  Laterality: Left;   AMPUTATION Right 11/19/2018   Procedure: AMPUTATION ABOVE KNEE;  Surgeon: Nadara Mustard, MD;  Location: [facility] OR;  Service: Orthopedics;  Laterality: Right;   ANTERIOR CERVICAL CORPECTOMY N/A 11/25/2015   Procedure: ANTERIOR CERVICAL FIVE CORPECTOMY Cervical four - six fusion;  Surgeon: Lisbeth Renshaw, MD;  Location: [facility];  Service: Neurosurgery;  Laterality: N/A;  ANTERIOR CERVICAL FIVE CORPECTOMY Cervical four - six fusion   APPLICATION OF WOUND VAC Right 10/22/2018   Procedure: Application Of Wound Vac;  Surgeon: Nadara Mustard, MD;  Location: [facility] OR;  Service: Orthopedics;  Laterality: Right;   CYSTOSCOPY WITH BIOPSY N/A 05/01/2022   Procedure: CYSTOSCOPY WITH URETHRAL BIOPSY;  Surgeon: Noel Christmas, MD;  Location: [facility] ORS;  Service: Urology;  Laterality: N/A;  1 HR   CYSTOSCOPY WITH RETROGRADE URETHROGRAM N/A 04/25/2018   Procedure: CYSTOSCOPY WITH RETROGRADE URETHROGRAM/ BALLOON DILATION;  Surgeon: Ihor Gully, MD;  Location: [facility];  Service: Urology;  Laterality: N/A;   CYSTOSCOPY WITH URETHRAL DILATATION N/A 05/01/2022   Procedure: BALLOON DILATION WITH  OPTILUME;  Surgeon: Noel Christmas, MD;  Location: [facility];  Service: Urology;  Laterality: N/A;   MULTIPLE TOOTH EXTRACTIONS     POSTERIOR CERVICAL  FUSION/FORAMINOTOMY N/A 11/29/2015   Procedure: Cervical Three-Cervical Seven Posterior Cervical Laminectomy with Fusion;  Surgeon: Lisbeth Renshaw, MD;  Location: [facility];  Service: Neurosurgery;  Laterality: N/A;  Cervical Three-Cervical Seven Posterior Cervical Laminectomy with Fusion   STUMP REVISION Right 10/22/2018   Procedure: REVISION RIGHT BELOW KNEE AMPUTATION;  Surgeon: Nadara Mustard, MD;  Location: [facility] OR;  Service: Orthopedics;  Laterality: Right;   STUMP REVISION Right 12/05/2018   Procedure: Revision Right Above Knee Amputation;  Surgeon: Nadara Mustard, MD;  Location: [facility] OR;  Service: Orthopedics;  Laterality: Right;   STUMP REVISION Right 05/29/2019   Procedure: REVISION RIGHT ABOVE KNEE AMPUTATION;  Surgeon: Nadara Mustard, MD;  Location: [facility] OR;  Service: Orthopedics;  Laterality: Right;   STUMP REVISION Right 06/24/2019   Procedure: STUMP REVISION;  Surgeon: Nadara Mustard, MD;  Location: [facility] OR;  Service: Orthopedics;  Laterality: Right;    Family History  Problem Relation Age of Onset   Diabetes Mother    Heart disease Mother    Cancer Mother    Cancer Father    Bone cancer Father    Prostate cancer Father    Cancer Paternal Grandfather    Cancer Other    Diabetes Other     Social History:  reports that he has been smoking cigarettes. He has been smoking an average of .5 packs per day. He has never used smokeless tobacco. He reports that he does not drink alcohol and does not use drugs.  Allergies:  Allergies  Allergen Reactions   Latex Other (See Comments)    "ALLERGIC," per MAR    Medications: I have reviewed the patient's current medications.  Results for orders placed or performed during the hospital encounter of 12/03/22 (from the past 48 hour(s))  Lactic acid, plasma     Status: Abnormal   Collection Time: 12/03/22  4:54 PM  Result Value Ref Range   Lactic Acid, Venous 3.1 (HH) 0.5 - 1.9 mmol/L    Comment: CRITICAL RESULT CALLED TO, READ BACK BY AND  VERIFIED WITH Merlinda Frederick RN AT [phone] BY D LONG Performed at [facility] Lab, [address] [place], [place] [address]   Comprehensive metabolic panel     Status: Abnormal   Collection Time: 12/03/22  4:54 PM  Result Value Ref Range   Sodium 133 (L) 135 - 145 mmol/L   Potassium 4.8 3.5 - 5.1 mmol/L   Chloride 98 98 - 111 mmol/L   CO2 15 (L) 22 - 32 mmol/L   Glucose, Bld 269 (H) 70 - 99 mg/dL    Comment: Glucose reference range applies only to samples taken after fasting for at least 8 hours.   BUN 51 (H) 8 - 23 mg/dL  Creatinine, Ser 3.38 (H) 0.61 - 1.24 mg/dL   Calcium 9.3 8.9 - 95.6 mg/dL   Total Protein 8.4 (H) 6.5 - 8.1 g/dL   Albumin 3.5 3.5 - 5.0 g/dL   AST 24 15 - 41 U/L   ALT 18 0 - 44 U/L   Alkaline Phosphatase 78 38 - 126 U/L   Total Bilirubin 1.0 0.3 - 1.2 mg/dL   GFR, Estimated 20 (L) >60 mL/min    Comment: (NOTE) Calculated using the CKD-EPI Creatinine Equation (2021)    Anion gap 20 (H) 5 - 15    Comment: Performed at [facility] Lab, [address] [place], [place] [address]  CBC with Differential     Status: Abnormal   Collection Time: 12/03/22  4:54 PM  Result Value Ref Range   WBC 28.7 (H) 4.0 - 10.5 K/uL   RBC 5.21 4.22 - 5.81 MIL/uL   Hemoglobin 14.4 13.0 - 17.0 g/dL   HCT 65.7 84.6 - 96.2 %   MCV 90.8 80.0 - 100.0 fL   MCH 27.6 26.0 - 34.0 pg   MCHC 30.4 30.0 - 36.0 g/dL   RDW 95.2 (H) 84.1 - 32.4 %   Platelets 500 (H) 150 - 400 K/uL   nRBC 0.0 0.0 - 0.2 %   Neutrophils Relative % 90 %   Neutro Abs 25.6 (H) 1.7 - 7.7 K/uL   Lymphocytes Relative 2 %   Lymphs Abs 0.7 0.7 - 4.0 K/uL   Monocytes Relative 7 %   Monocytes Absolute 2.0 (H) 0.1 - 1.0 K/uL   Eosinophils Relative 0 %   Eosinophils Absolute 0.0 0.0 - 0.5 K/uL   Basophils Relative 0 %   Basophils Absolute 0.1 0.0 - 0.1 K/uL   Immature Granulocytes 1 %   Abs Immature Granulocytes 0.16 (H) 0.00 - 0.07 K/uL    Comment: Performed at [facility] Lab, [address]  [place], [place] [address]  Protime-INR     Status: None   Collection Time: 12/03/22  4:54 PM  Result Value Ref Range   Prothrombin Time 13.9 11.4 - 15.2 seconds   INR 1.1 0.8 - 1.2    Comment: (NOTE) INR goal varies based on device and disease states. Performed at [facility], [address] [place], [place] [address]   APTT     Status: None   Collection Time: 12/03/22  4:54 PM  Result Value Ref Range   aPTT 29 24 - 36 seconds    Comment: Performed at [facility], [address] [place], [place] [address]  Lipase, blood     Status: None   Collection Time: 12/03/22  4:54 PM  Result Value Ref Range   Lipase 21 11 - 51 U/L    Comment: Performed at [facility], [address] [place], [place] [address]  Urinalysis, w/ Reflex to Culture (Infection Suspected) -Urine, Clean Catch     Status: Abnormal   Collection Time: 12/03/22  4:54 PM  Result Value Ref Range   Specimen Source URINE, CLEAN CATCH    Color, Urine GREEN (A) YELLOW    Comment: BIOCHEMICALS MAY BE AFFECTED BY COLOR   APPearance HAZY (A) CLEAR   Specific Gravity, Urine  1.005 - 1.030    TEST NOT REPORTED DUE TO COLOR INTERFERENCE OF URINE PIGMENT   pH  5.0 - 8.0    TEST NOT REPORTED DUE TO COLOR INTERFERENCE OF URINE PIGMENT   Glucose, UA (A)  NEGATIVE mg/dL    TEST NOT REPORTED DUE TO COLOR INTERFERENCE OF URINE PIGMENT   Hgb urine dipstick (A) NEGATIVE    TEST NOT REPORTED DUE TO COLOR INTERFERENCE OF URINE PIGMENT   Bilirubin Urine (A) NEGATIVE    TEST NOT REPORTED DUE TO COLOR INTERFERENCE OF URINE PIGMENT   Ketones, ur (A) NEGATIVE mg/dL    TEST NOT REPORTED DUE TO COLOR INTERFERENCE OF URINE PIGMENT   Protein, ur (A) NEGATIVE mg/dL    TEST NOT REPORTED DUE TO COLOR INTERFERENCE OF URINE PIGMENT   Nitrite (A) NEGATIVE    TEST NOT REPORTED DUE TO COLOR INTERFERENCE OF URINE PIGMENT   Leukocytes,Ua (A) NEGATIVE    TEST NOT REPORTED DUE TO COLOR INTERFERENCE OF URINE PIGMENT   Squamous Epithelial  / HPF 0-5 0 - 5 /HPF   WBC, UA 21-50 0 - 5 WBC/hpf    Comment: Reflex urine culture not performed if WBC <=10, OR if Squamous epithelial cells >5. If Squamous epithelial cells >5, suggest recollection.   RBC / HPF 0-5 0 - 5 RBC/hpf   Bacteria, UA MANY (A) NONE SEEN   WBC Clumps PRESENT    Mucus PRESENT     Comment: Performed at [facility] Lab, [address], [place], [place] [address]  Troponin I (High Sensitivity)     Status: Abnormal   Collection Time: 12/03/22  4:54 PM  Result Value Ref Range   Troponin I (High Sensitivity) 32 (H) <18 ng/L    Comment: (NOTE) Elevated high sensitivity troponin I (hsTnI) values and significant  changes across serial measurements may suggest ACS but many other  chronic and acute conditions are known to elevate hsTnI results.  Refer to the "Links" section for chest pain algorithms and additional  guidance. Performed at [facility], [address], [place], [place] [address]   Urine Culture     Status: None (Preliminary result)   Collection Time: 12/03/22  4:54 PM   Specimen: Urine, Clean Catch  Result Value Ref Range   Specimen Description URINE, CLEAN CATCH    Special Requests      NONE Reflexed from [id number] Performed at [facility] Lab, [address], [place], [place] [address]    Culture PENDING    Report Status PENDING   I-Stat Chem 8, ED     Status: Abnormal   Collection Time: 12/03/22  5:15 PM  Result Value Ref Range   Sodium 133 (L) 135 - 145 mmol/L   Potassium 4.7 3.5 - 5.1 mmol/L   Chloride 105 98 - 111 mmol/L   BUN 48 (H) 8 - 23 mg/dL   Creatinine, Ser 5.78 (H) 0.61 - 1.24 mg/dL   Glucose, Bld 469 (H) 70 - 99 mg/dL    Comment: Glucose reference range applies only to samples taken after fasting for at least 8 hours.   Calcium, Ion 1.08 (L) 1.15 - 1.40 mmol/L   TCO2 18 (L) 22 - 32 mmol/L   Hemoglobin 16.0 13.0 - 17.0 g/dL   HCT 62.9 52.8 - 41.3 %  Resp panel by RT-PCR (RSV, Flu A&B, Covid) Anterior Nasal Swab      Status: None   Collection Time: 12/03/22  5:44 PM   Specimen: Anterior Nasal Swab  Result Value Ref Range   SARS Coronavirus 2 by RT PCR NEGATIVE NEGATIVE   Influenza A by PCR NEGATIVE NEGATIVE   Influenza B by PCR NEGATIVE NEGATIVE    Comment: (NOTE) The Xpert Xpress  SARS-CoV-2/FLU/RSV plus assay is intended as an aid in the diagnosis of influenza from Nasopharyngeal swab specimens and should not be used as a sole basis for treatment. Nasal washings and aspirates are unacceptable for Xpert Xpress SARS-CoV-2/FLU/RSV testing.  Fact Sheet for Patients: BloggerCourse.com  Fact Sheet for Healthcare Providers: SeriousBroker.it  This test is not yet approved or cleared by the [place] FDA and has been authorized for detection and/or diagnosis of SARS-CoV-2 by FDA under an Emergency Use Authorization (EUA). This EUA will remain in effect (meaning this test can be used) for the duration of the COVID-19 declaration under Section 564(b)(1) of the Act, 21 U.S.C. section 360bbb-3(b)(1), unless the authorization is terminated or revoked.     Resp Syncytial Virus by PCR NEGATIVE NEGATIVE    Comment: (NOTE) Fact Sheet for Patients: BloggerCourse.com  Fact Sheet for Healthcare Providers: SeriousBroker.it  This test is not yet approved or cleared by the [place] FDA and has been authorized for detection and/or diagnosis of SARS-CoV-2 by FDA under an Emergency Use Authorization (EUA). This EUA will remain in effect (meaning this test can be used) for the duration of the COVID-19 declaration under Section 564(b)(1) of the Act, 21 U.S.C. section 360bbb-3(b)(1), unless the authorization is terminated or revoked.  Performed at [facility] Lab, [address], [place], [place] [address]   Lactic acid, plasma     Status: Abnormal   Collection Time: 12/03/22  7:20 PM  Result Value  Ref Range   Lactic Acid, Venous 4.1 (HH) 0.5 - 1.9 mmol/L    Comment: CRITICAL VALUE NOTED. VALUE IS CONSISTENT WITH PREVIOUSLY REPORTED/CALLED VALUE Performed at [facility], [address] [place], [place] [address]   Brain natriuretic peptide     Status: None   Collection Time: 12/03/22  7:20 PM  Result Value Ref Range   B Natriuretic Peptide 45.7 0.0 - 100.0 pg/mL    Comment: Performed at [facility], [address], [place], [place] [address]  Troponin I (High Sensitivity)     Status: Abnormal   Collection Time: 12/03/22  7:20 PM  Result Value Ref Range   Troponin I (High Sensitivity) 63 (H) <18 ng/L    Comment: RESULT CALLED TO, READ BACK BY AND VERIFIED WITH N WHEATLEY RN AT [phone] BY D LONG DELTA CHECK NOTED (NOTE) Elevated high sensitivity troponin I (hsTnI) values and significant  changes across serial measurements may suggest ACS but many other  chronic and acute conditions are known to elevate hsTnI results.  Refer to the "Links" section for chest pain algorithms and additional  guidance. Performed at [facility], [address] [place], [place] [address]   Beta-hydroxybutyric acid     Status: None   Collection Time: 12/03/22  7:20 PM  Result Value Ref Range   Beta-Hydroxybutyric Acid 0.17 0.05 - 0.27 mmol/L    Comment: Performed at [facility], [address], [place], [place] [address]  Lactic acid, plasma     Status: Abnormal   Collection Time: 12/03/22  8:40 PM  Result Value Ref Range   Lactic Acid, Venous 6.1 (HH) 0.5 - 1.9 mmol/L    Comment: CRITICAL VALUE NOTED. VALUE IS CONSISTENT WITH PREVIOUSLY REPORTED/CALLED VALUE Performed at [facility], [address], [place], [place] [address]     DG Chest Port 1 View  Result Date: 12/03/2022 CLINICAL DATA:  Possible sepsis EXAM: PORTABLE CHEST 1 VIEW COMPARISON:  09/18/2022 FINDINGS: Cardiac  shadow is stable. Lungs are well aerated bilaterally. No focal infiltrate or  effusion is seen. Postsurgical changes in the cervical spine are noted. IMPRESSION: No active disease. Electronically Signed   By: Alcide Clever M.D.   On: 12/03/2022 19:32   CT Angio Chest PE W and/or Wo Contrast  Result Date: 12/03/2022 CLINICAL DATA:  Hypoxia with shortness of breath and tachycardia Generalized abdominal pain and distension.  Sepsis. EXAM: CT ANGIOGRAPHY CHEST CT ABDOMEN AND PELVIS WITH CONTRAST TECHNIQUE: Multidetector CT imaging of the chest was performed using the standard protocol during bolus administration of intravenous contrast. Multiplanar CT image reconstructions and MIPs were obtained to evaluate the vascular anatomy. Multidetector CT imaging of the abdomen and pelvis was performed using the standard protocol during bolus administration of intravenous contrast. RADIATION DOSE REDUCTION: This exam was performed according to the departmental dose-optimization program which includes automated exposure control, adjustment of the mA and/or kV according to patient size and/or use of iterative reconstruction technique. CONTRAST:  75mL OMNIPAQUE IOHEXOL 350 MG/ML SOLN COMPARISON:  07/04/2021 chest CT FINDINGS: CTA CHEST FINDINGS Cardiovascular: Satisfactory opacification of the pulmonary arteries to the segmental level. No evidence of pulmonary embolism. Normal heart size. No pericardial effusion. Atherosclerosis affecting the aorta and coronaries. Mediastinum/Nodes: Negative for mass or adenopathy Lungs/Pleura: Patchy haziness of the bilateral lungs, likely atelectasis. No consolidation, edema, effusion, or pneumothorax. There is some mucoid impaction within left lower lobe airways. Musculoskeletal: No acute or aggressive finding Review of the MIP images confirms the above findings. CT ABDOMEN and PELVIS FINDINGS Hepatobiliary: Subcentimeter low-density in the central liver, too small for accurate densitometry.Cholelithiasis. No evidence of biliary obstruction or inflammation. Pancreas:  Unremarkable. Spleen: Unremarkable. Adrenals/Urinary Tract: Negative adrenals. No hydronephrosis or stone. Collapsed urinary bladder around a Foley catheter. Stomach/Bowel: Diffuse distension of the colon. Some mild pericholecystic fat edema is present distally. Fluid levels are seen proximally and desiccated stool distally. No volvulus or discrete obstructing lesion is seen. Despite distal desiccated stool, no convincing rectal impaction. Vascular/Lymphatic: No acute vascular abnormality. No mass or adenopathy. Reproductive:No pathologic findings. Other: No ascites or pneumoperitoneum. Musculoskeletal: No acute abnormalities. Muscular atrophy atraumatically affecting the right hip musculature. Generalized lumbar spine degeneration. Review of the MIP images confirms the above findings. IMPRESSION: Chest CTA: 1. Negative for pulmonary embolism or other acute finding. 2. Atherosclerosis including the coronary arteries. Abdominal CT: 1. Diffuse colonic distension and tortuosity. There is desiccated stool distally but the overall pattern favors adynamic ileus over mechanical obstruction. 2. Cholelithiasis and atherosclerosis. Electronically Signed   By: Tiburcio Pea M.D.   On: 12/03/2022 19:12   CT ABDOMEN PELVIS W CONTRAST  Result Date: 12/03/2022 CLINICAL DATA:  Hypoxia with shortness of breath and tachycardia Generalized abdominal pain and distension.  Sepsis. EXAM: CT ANGIOGRAPHY CHEST CT ABDOMEN AND PELVIS WITH CONTRAST TECHNIQUE: Multidetector CT imaging of the chest was performed using the standard protocol during bolus administration of intravenous contrast. Multiplanar CT image reconstructions and MIPs were obtained to evaluate the vascular anatomy. Multidetector CT imaging of the abdomen and pelvis was performed using the standard protocol during bolus administration of intravenous contrast. RADIATION DOSE REDUCTION: This exam was performed according to the departmental dose-optimization program which  includes automated exposure control, adjustment of the mA and/or kV according to patient size and/or use of iterative reconstruction technique. CONTRAST:  75mL OMNIPAQUE IOHEXOL 350 MG/ML SOLN COMPARISON:  07/04/2021 chest CT FINDINGS: CTA CHEST FINDINGS Cardiovascular: Satisfactory opacification of the pulmonary arteries to the segmental level. No evidence of  pulmonary embolism. Normal heart size. No pericardial effusion. Atherosclerosis affecting the aorta and coronaries. Mediastinum/Nodes: Negative for mass or adenopathy Lungs/Pleura: Patchy haziness of the bilateral lungs, likely atelectasis. No consolidation, edema, effusion, or pneumothorax. There is some mucoid impaction within left lower lobe airways. Musculoskeletal: No acute or aggressive finding Review of the MIP images confirms the above findings. CT ABDOMEN and PELVIS FINDINGS Hepatobiliary: Subcentimeter low-density in the central liver, too small for accurate densitometry.Cholelithiasis. No evidence of biliary obstruction or inflammation. Pancreas: Unremarkable. Spleen: Unremarkable. Adrenals/Urinary Tract: Negative adrenals. No hydronephrosis or stone. Collapsed urinary bladder around a Foley catheter. Stomach/Bowel: Diffuse distension of the colon. Some mild pericholecystic fat edema is present distally. Fluid levels are seen proximally and desiccated stool distally. No volvulus or discrete obstructing lesion is seen. Despite distal desiccated stool, no convincing rectal impaction. Vascular/Lymphatic: No acute vascular abnormality. No mass or adenopathy. Reproductive:No pathologic findings. Other: No ascites or pneumoperitoneum. Musculoskeletal: No acute abnormalities. Muscular atrophy atraumatically affecting the right hip musculature. Generalized lumbar spine degeneration. Review of the MIP images confirms the above findings. IMPRESSION: Chest CTA: 1. Negative for pulmonary embolism or other acute finding. 2. Atherosclerosis including the coronary  arteries. Abdominal CT: 1. Diffuse colonic distension and tortuosity. There is desiccated stool distally but the overall pattern favors adynamic ileus over mechanical obstruction. 2. Cholelithiasis and atherosclerosis. Electronically Signed   By: Tiburcio Pea M.D.   On: 12/03/2022 19:12    Review of Systems  Constitutional: Negative.   HENT: Negative.    Eyes: Negative.   Respiratory: Negative.    Cardiovascular: Negative.   Gastrointestinal:  Positive for abdominal pain, nausea and vomiting.  Genitourinary: Negative.   Musculoskeletal: Negative.   Skin: Negative.   Neurological: Negative.   Endo/Heme/Allergies: Negative.   Psychiatric/Behavioral: Negative.      PE Blood pressure (!) 113/98, pulse 95, temperature (!) 101.5 F (38.6 C), resp. rate (!) 25, SpO2 91 %. Constitutional: NAD; conversant; no deformities Eyes: Moist conjunctiva; no lid lag; anicteric; PERRL Neck: Trachea midline; no thyromegaly Lungs: Normal respiratory effort; no tactile fremitus CV: RRR; no palpable thrills; no pitting edema GI: Abd distended, tender to palpation throughout; no palpable hepatosplenomegaly MSK: right amputee; no clubbing/cyanosis Psychiatric: Appropriate affect; alert and oriented x3 Lymphatic: No palpable cervical or axillary lymphadenopathy Skin: No major subcutaneous nodules. Warm and dry   Assessment/Plan: 62 yo male with abdominal pain and findings of lactic acidosis. CT scan with normal appearing small intestine and dilation of large intestine with stool balls seen in distal colon -NPO -pain control -enema to attempt to clear stool burden -serial exams  I reviewed last 24 h vitals and pain scores, last 48 h intake and output, last 24 h labs and trends, and last 24 h imaging results.  This care required high  level of medical decision making.   De Blanch Jenna Ardoin 12/03/2022, 11:22 PM   No improvement on recheck, lactate continues to uptrend, no other explanation and  continued tenderness and guarding. -OR for ex lap for concern of bowel ischemia. Possible bowel resection, possible ostomy. Discussed concern and plan with patient who shoed understanding and agreed.  Tytionna Cloyd 12/03/2022, 11:50 PM

## 2022-12-04 ENCOUNTER — Inpatient Hospital Stay (HOSPITAL_COMMUNITY): Payer: Medicaid Other

## 2022-12-04 ENCOUNTER — Inpatient Hospital Stay (HOSPITAL_COMMUNITY): Payer: Medicaid Other | Admitting: Certified Registered Nurse Anesthetist

## 2022-12-04 ENCOUNTER — Other Ambulatory Visit: Payer: Medicaid Other

## 2022-12-04 ENCOUNTER — Encounter (HOSPITAL_COMMUNITY): Payer: Self-pay | Admitting: General Surgery

## 2022-12-04 DIAGNOSIS — I110 Hypertensive heart disease with heart failure: Secondary | ICD-10-CM

## 2022-12-04 DIAGNOSIS — F17210 Nicotine dependence, cigarettes, uncomplicated: Secondary | ICD-10-CM

## 2022-12-04 DIAGNOSIS — K55039 Acute (reversible) ischemia of large intestine, extent unspecified: Secondary | ICD-10-CM | POA: Diagnosis not present

## 2022-12-04 DIAGNOSIS — R579 Shock, unspecified: Secondary | ICD-10-CM

## 2022-12-04 DIAGNOSIS — I509 Heart failure, unspecified: Secondary | ICD-10-CM | POA: Diagnosis not present

## 2022-12-04 LAB — POCT I-STAT 7, (LYTES, BLD GAS, ICA,H+H)
Acid-base deficit: 13 mmol/L — ABNORMAL HIGH (ref 0.0–2.0)
Acid-base deficit: 7 mmol/L — ABNORMAL HIGH (ref 0.0–2.0)
Acid-base deficit: 8 mmol/L — ABNORMAL HIGH (ref 0.0–2.0)
Acid-base deficit: 7 mmol/L — ABNORMAL HIGH (ref 0.0–2.0)
Bicarbonate: 15.9 mmol/L — ABNORMAL LOW (ref 20.0–28.0)
Bicarbonate: 20.4 mmol/L (ref 20.0–28.0)
Bicarbonate: 20.5 mmol/L (ref 20.0–28.0)
Bicarbonate: 18.8 mmol/L — ABNORMAL LOW (ref 20.0–28.0)
Calcium, Ion: 1.06 mmol/L — ABNORMAL LOW (ref 1.15–1.40)
Calcium, Ion: 1.13 mmol/L — ABNORMAL LOW (ref 1.15–1.40)
Calcium, Ion: 1.09 mmol/L — ABNORMAL LOW (ref 1.15–1.40)
Calcium, Ion: 1.14 mmol/L — ABNORMAL LOW (ref 1.15–1.40)
HCT: 26 % — ABNORMAL LOW (ref 39.0–52.0)
HCT: 33 % — ABNORMAL LOW (ref 39.0–52.0)
HCT: 32 % — ABNORMAL LOW (ref 39.0–52.0)
HCT: 34 % — ABNORMAL LOW (ref 39.0–52.0)
Hemoglobin: 11.2 g/dL — ABNORMAL LOW (ref 13.0–17.0)
Hemoglobin: 8.8 g/dL — ABNORMAL LOW (ref 13.0–17.0)
Hemoglobin: 10.9 g/dL — ABNORMAL LOW (ref 13.0–17.0)
Hemoglobin: 11.6 g/dL — ABNORMAL LOW (ref 13.0–17.0)
O2 Saturation: 100 %
O2 Saturation: 99 %
O2 Saturation: 90 %
O2 Saturation: 97 %
Patient temperature: 37
Patient temperature: 37.5
Patient temperature: 36.3
Patient temperature: 37
Potassium: 3.9 mmol/L (ref 3.5–5.1)
Potassium: 4.3 mmol/L (ref 3.5–5.1)
Potassium: 3.9 mmol/L (ref 3.5–5.1)
Potassium: 4.1 mmol/L (ref 3.5–5.1)
Sodium: 134 mmol/L — ABNORMAL LOW (ref 135–145)
Sodium: 136 mmol/L (ref 135–145)
Sodium: 137 mmol/L (ref 135–145)
Sodium: 134 mmol/L — ABNORMAL LOW (ref 135–145)
TCO2: 17 mmol/L — ABNORMAL LOW (ref 22–32)
TCO2: 22 mmol/L (ref 22–32)
TCO2: 22 mmol/L (ref 22–32)
TCO2: 20 mmol/L — ABNORMAL LOW (ref 22–32)
pCO2 arterial: 47.7 mmHg (ref 32–48)
pCO2 arterial: 49.3 mmHg — ABNORMAL HIGH (ref 32–48)
pCO2 arterial: 52.1 mmHg — ABNORMAL HIGH (ref 32–48)
pCO2 arterial: 38.9 mmHg (ref 32–48)
pH, Arterial: 7.12 — CL (ref 7.35–7.45)
pH, Arterial: 7.24 — ABNORMAL LOW (ref 7.35–7.45)
pH, Arterial: 7.198 — CL (ref 7.35–7.45)
pH, Arterial: 7.292 — ABNORMAL LOW (ref 7.35–7.45)
pO2, Arterial: 160 mmHg — ABNORMAL HIGH (ref 83–108)
pO2, Arterial: 199 mmHg — ABNORMAL HIGH (ref 83–108)
pO2, Arterial: 69 mmHg — ABNORMAL LOW (ref 83–108)
pO2, Arterial: 105 mmHg (ref 83–108)

## 2022-12-04 LAB — CBC
HCT: 34.4 % — ABNORMAL LOW (ref 39.0–52.0)
HCT: 39.8 % (ref 39.0–52.0)
Hemoglobin: 10.5 g/dL — ABNORMAL LOW (ref 13.0–17.0)
Hemoglobin: 12.4 g/dL — ABNORMAL LOW (ref 13.0–17.0)
MCH: 27.3 pg (ref 26.0–34.0)
MCH: 27.3 pg (ref 26.0–34.0)
MCHC: 30.5 g/dL (ref 30.0–36.0)
MCHC: 31.2 g/dL (ref 30.0–36.0)
MCV: 89.6 fL (ref 80.0–100.0)
MCV: 87.7 fL (ref 80.0–100.0)
Platelets: 240 10*3/uL (ref 150–400)
Platelets: 253 10*3/uL (ref 150–400)
RBC: 3.84 MIL/uL — ABNORMAL LOW (ref 4.22–5.81)
RBC: 4.54 MIL/uL (ref 4.22–5.81)
RDW: 15.8 % — ABNORMAL HIGH (ref 11.5–15.5)
RDW: 15.8 % — ABNORMAL HIGH (ref 11.5–15.5)
WBC: 7.4 10*3/uL (ref 4.0–10.5)
WBC: 8.9 10*3/uL (ref 4.0–10.5)
nRBC: 0 % (ref 0.0–0.2)
nRBC: 0 % (ref 0.0–0.2)

## 2022-12-04 LAB — LACTIC ACID, PLASMA
Lactic Acid, Venous: 3.2 mmol/L (ref 0.5–1.9)
Lactic Acid, Venous: 2.5 mmol/L (ref 0.5–1.9)

## 2022-12-04 LAB — BASIC METABOLIC PANEL
Anion gap: 12 (ref 5–15)
BUN: 50 mg/dL — ABNORMAL HIGH (ref 8–23)
CO2: 20 mmol/L — ABNORMAL LOW (ref 22–32)
Calcium: 7.2 mg/dL — ABNORMAL LOW (ref 8.9–10.3)
Chloride: 102 mmol/L (ref 98–111)
Creatinine, Ser: 3.24 mg/dL — ABNORMAL HIGH (ref 0.61–1.24)
GFR, Estimated: 21 mL/min — ABNORMAL LOW (ref >60)
Glucose, Bld: 268 mg/dL — ABNORMAL HIGH (ref 70–99)
Potassium: 3.8 mmol/L (ref 3.5–5.1)
Sodium: 134 mmol/L — ABNORMAL LOW (ref 135–145)

## 2022-12-04 LAB — COMPREHENSIVE METABOLIC PANEL
ALT: 17 U/L (ref 0–44)
AST: 31 U/L (ref 15–41)
Albumin: 2.1 g/dL — ABNORMAL LOW (ref 3.5–5.0)
Alkaline Phosphatase: 49 U/L (ref 38–126)
Anion gap: 13 (ref 5–15)
BUN: 53 mg/dL — ABNORMAL HIGH (ref 8–23)
CO2: 21 mmol/L — ABNORMAL LOW (ref 22–32)
Calcium: 7.7 mg/dL — ABNORMAL LOW (ref 8.9–10.3)
Chloride: 99 mmol/L (ref 98–111)
Creatinine, Ser: 3.44 mg/dL — ABNORMAL HIGH (ref 0.61–1.24)
GFR, Estimated: 19 mL/min — ABNORMAL LOW (ref >60)
Glucose, Bld: 287 mg/dL — ABNORMAL HIGH (ref 70–99)
Potassium: 4.1 mmol/L (ref 3.5–5.1)
Sodium: 133 mmol/L — ABNORMAL LOW (ref 135–145)
Total Bilirubin: 0.9 mg/dL (ref 0.3–1.2)
Total Protein: 5 g/dL — ABNORMAL LOW (ref 6.5–8.1)

## 2022-12-04 LAB — HEMOGLOBIN A1C
Hgb A1c MFr Bld: 6.9 % — ABNORMAL HIGH (ref 4.8–5.6)
Mean Plasma Glucose: 151.33 mg/dL

## 2022-12-04 LAB — GLUCOSE, CAPILLARY
Glucose-Capillary: 174 mg/dL — ABNORMAL HIGH (ref 70–99)
Glucose-Capillary: 253 mg/dL — ABNORMAL HIGH (ref 70–99)
Glucose-Capillary: 236 mg/dL — ABNORMAL HIGH (ref 70–99)
Glucose-Capillary: 211 mg/dL — ABNORMAL HIGH (ref 70–99)
Glucose-Capillary: 183 mg/dL — ABNORMAL HIGH (ref 70–99)
Glucose-Capillary: 187 mg/dL — ABNORMAL HIGH (ref 70–99)

## 2022-12-04 LAB — TYPE AND SCREEN: ABO/RH(D): O POS

## 2022-12-04 LAB — BPAM RBC: ISSUE DATE / TIME: 202406180435

## 2022-12-04 LAB — MRSA NEXT GEN BY PCR, NASAL: MRSA by PCR Next Gen: DETECTED — AB

## 2022-12-04 LAB — MAGNESIUM: Magnesium: 2.9 mg/dL — ABNORMAL HIGH (ref 1.7–2.4)

## 2022-12-04 LAB — CULTURE, BLOOD (ROUTINE X 2)

## 2022-12-04 LAB — PREPARE RBC (CROSSMATCH)

## 2022-12-04 LAB — PHOSPHORUS: Phosphorus: 5.2 mg/dL — ABNORMAL HIGH (ref 2.5–4.6)

## 2022-12-04 LAB — CBG MONITORING, ED: Glucose-Capillary: 199 mg/dL — ABNORMAL HIGH (ref 70–99)

## 2022-12-04 MED ORDER — SUCCINYLCHOLINE CHLORIDE 200 MG/10ML IV SOSY
PREFILLED_SYRINGE | INTRAVENOUS | Status: AC
  Filled 2022-12-04: qty 10

## 2022-12-04 MED ORDER — PROPOFOL 10 MG/ML IV BOLUS
INTRAVENOUS | Status: AC
  Filled 2022-12-04: qty 20

## 2022-12-04 MED ORDER — EPINEPHRINE 1 MG/10ML IJ SOSY
PREFILLED_SYRINGE | INTRAMUSCULAR | Status: DC | PRN
  Administered 2022-12-04: .2 mg via INTRAVENOUS
  Administered 2022-12-04: .1 mg via INTRAVENOUS
  Administered 2022-12-04: .05 mg via INTRAVENOUS

## 2022-12-04 MED ORDER — SODIUM CHLORIDE 0.9 % IV SOLN: INTRAVENOUS | Status: DC | PRN

## 2022-12-04 MED ORDER — IPRATROPIUM-ALBUTEROL 0.5-2.5 (3) MG/3ML IN SOLN: 3.0000 mL | Freq: Four times a day (QID) | RESPIRATORY_TRACT | Status: DC | PRN

## 2022-12-04 MED ORDER — ORAL CARE MOUTH RINSE
15.0000 mL | OROMUCOSAL | Status: DC
  Administered 2022-12-04 – 2022-12-06 (×20): 15 mL via OROMUCOSAL

## 2022-12-04 MED ORDER — EPINEPHRINE HCL 5 MG/250ML IV SOLN IN NS
0.5000 ug/min | INTRAVENOUS | Status: DC
  Administered 2022-12-04: 1 ug/min via INTRAVENOUS
  Filled 2022-12-04: qty 250

## 2022-12-04 MED ORDER — MIDAZOLAM HCL 2 MG/2ML IJ SOLN
INTRAMUSCULAR | Status: AC
  Filled 2022-12-04: qty 2

## 2022-12-04 MED ORDER — LIDOCAINE 2% (20 MG/ML) 5 ML SYRINGE
INTRAMUSCULAR | Status: AC
  Filled 2022-12-04: qty 5

## 2022-12-04 MED ORDER — PHENYLEPHRINE HCL-NACL 20-0.9 MG/250ML-% IV SOLN
0.0000 ug/min | INTRAVENOUS | Status: DC
  Administered 2022-12-04: 80 ug/min via INTRAVENOUS

## 2022-12-04 MED ORDER — NOREPINEPHRINE 4 MG/250ML-% IV SOLN
INTRAVENOUS | Status: AC
  Filled 2022-12-04: qty 250

## 2022-12-04 MED ORDER — VASOPRESSIN 20 UNIT/ML IV SOLN
INTRAVENOUS | Status: DC | PRN
  Administered 2022-12-04: 1 [IU] via INTRAVENOUS
  Administered 2022-12-04 (×3): 2 [IU] via INTRAVENOUS
  Administered 2022-12-04: 1 [IU] via INTRAVENOUS
  Administered 2022-12-04 (×2): 2 [IU] via INTRAVENOUS

## 2022-12-04 MED ORDER — ACETAMINOPHEN 10 MG/ML IV SOLN
1000.0000 mg | Freq: Three times a day (TID) | INTRAVENOUS | Status: AC | PRN
  Administered 2022-12-04 – 2022-12-05 (×2): 1000 mg via INTRAVENOUS
  Filled 2022-12-04 (×2): qty 100

## 2022-12-04 MED ORDER — SODIUM BICARBONATE 8.4 % IV SOLN
INTRAVENOUS | Status: DC
  Filled 2022-12-04 (×5): qty 1000

## 2022-12-04 MED ORDER — INSULIN GLARGINE-YFGN 100 UNIT/ML ~~LOC~~ SOLN
8.0000 [IU] | Freq: Every day | SUBCUTANEOUS | Status: DC
  Administered 2022-12-04: 8 [IU] via SUBCUTANEOUS
  Filled 2022-12-04 (×2): qty 0.08

## 2022-12-04 MED ORDER — KETAMINE HCL 50 MG/5ML IJ SOSY
PREFILLED_SYRINGE | INTRAMUSCULAR | Status: AC
  Filled 2022-12-04: qty 5

## 2022-12-04 MED ORDER — NOREPINEPHRINE 4 MG/250ML-% IV SOLN
INTRAVENOUS | Status: AC
  Administered 2022-12-04: 19 ug/min via INTRAVENOUS
  Filled 2022-12-04: qty 250

## 2022-12-04 MED ORDER — PHENYLEPHRINE HCL-NACL 20-0.9 MG/250ML-% IV SOLN
INTRAVENOUS | Status: DC | PRN
  Administered 2022-12-04: 40 ug/min via INTRAVENOUS

## 2022-12-04 MED ORDER — ONDANSETRON HCL 4 MG/2ML IJ SOLN
INTRAMUSCULAR | Status: AC
  Filled 2022-12-04: qty 2

## 2022-12-04 MED ORDER — CHLORHEXIDINE GLUCONATE CLOTH 2 % EX PADS
6.0000 | MEDICATED_PAD | Freq: Every day | CUTANEOUS | Status: DC
  Administered 2022-12-04 – 2022-12-08 (×6): 6 via TOPICAL

## 2022-12-04 MED ORDER — SODIUM BICARBONATE 8.4 % IV SOLN
INTRAVENOUS | Status: DC | PRN
  Administered 2022-12-04 (×2): 50 mL via INTRAVENOUS

## 2022-12-04 MED ORDER — NOREPINEPHRINE 4 MG/250ML-% IV SOLN
INTRAVENOUS | Status: DC | PRN
  Administered 2022-12-04: 4 ug/min via INTRAVENOUS

## 2022-12-04 MED ORDER — PHENYLEPHRINE HCL-NACL 20-0.9 MG/250ML-% IV SOLN
INTRAVENOUS | Status: AC
  Filled 2022-12-04: qty 250

## 2022-12-04 MED ORDER — ROCURONIUM BROMIDE 10 MG/ML (PF) SYRINGE
PREFILLED_SYRINGE | INTRAVENOUS | Status: AC
  Filled 2022-12-04: qty 20

## 2022-12-04 MED ORDER — SUCCINYLCHOLINE CHLORIDE 200 MG/10ML IV SOSY
PREFILLED_SYRINGE | INTRAVENOUS | Status: DC | PRN
  Administered 2022-12-04: 200 mg via INTRAVENOUS

## 2022-12-04 MED ORDER — FENTANYL 2500MCG IN NS 250ML (10MCG/ML) PREMIX INFUSION
25.0000 ug/h | INTRAVENOUS | Status: DC
  Administered 2022-12-04: 25 ug/h via INTRAVENOUS
  Administered 2022-12-05: 150 ug/h via INTRAVENOUS
  Filled 2022-12-04 (×2): qty 250

## 2022-12-04 MED ORDER — SODIUM BICARBONATE 8.4 % IV SOLN
INTRAVENOUS | Status: AC
  Filled 2022-12-04: qty 150

## 2022-12-04 MED ORDER — ROCURONIUM BROMIDE 100 MG/10ML IV SOLN
INTRAVENOUS | Status: DC | PRN
  Administered 2022-12-04: 100 mg via INTRAVENOUS
  Administered 2022-12-04: 50 mg via INTRAVENOUS

## 2022-12-04 MED ORDER — SODIUM CHLORIDE 0.9% IV SOLUTION: Freq: Once | INTRAVENOUS | Status: AC

## 2022-12-04 MED ORDER — VASOPRESSIN 20 UNIT/ML IV SOLN
INTRAVENOUS | Status: AC
  Filled 2022-12-04: qty 1

## 2022-12-04 MED ORDER — ESCITALOPRAM OXALATE 10 MG PO TABS
20.0000 mg | ORAL_TABLET | Freq: Every day | ORAL | Status: DC
  Administered 2022-12-04 – 2022-12-13 (×9): 20 mg
  Filled 2022-12-04 (×9): qty 2

## 2022-12-04 MED ORDER — NOREPINEPHRINE 4 MG/250ML-% IV SOLN
0.0000 ug/min | INTRAVENOUS | Status: DC
  Administered 2022-12-04 (×2): 15 ug/min via INTRAVENOUS
  Filled 2022-12-04: qty 250

## 2022-12-04 MED ORDER — NOREPINEPHRINE 16 MG/250ML-% IV SOLN
0.0000 ug/min | INTRAVENOUS | Status: DC
  Administered 2022-12-04: 15 ug/min via INTRAVENOUS
  Administered 2022-12-05: 14 ug/min via INTRAVENOUS
  Administered 2022-12-06: 15 ug/min via INTRAVENOUS
  Administered 2022-12-06: 17 ug/min via INTRAVENOUS
  Administered 2022-12-08: 2 ug/min via INTRAVENOUS
  Filled 2022-12-04 (×5): qty 250

## 2022-12-04 MED ORDER — MIDAZOLAM HCL 5 MG/5ML IJ SOLN
INTRAMUSCULAR | Status: DC | PRN
  Administered 2022-12-04: 4 mg via INTRAVENOUS

## 2022-12-04 MED ORDER — MUPIROCIN 2 % EX OINT
1.0000 | TOPICAL_OINTMENT | Freq: Two times a day (BID) | CUTANEOUS | Status: AC
  Administered 2022-12-04 – 2022-12-08 (×10): 1 via NASAL
  Filled 2022-12-04: qty 22

## 2022-12-04 MED ORDER — ORAL CARE MOUTH RINSE: 15.0000 mL | OROMUCOSAL | Status: DC | PRN

## 2022-12-04 MED ORDER — FENTANYL CITRATE PF 50 MCG/ML IJ SOSY
50.0000 ug | PREFILLED_SYRINGE | Freq: Once | INTRAMUSCULAR | Status: AC
  Administered 2022-12-04: 50 ug via INTRAVENOUS

## 2022-12-04 MED ORDER — ETOMIDATE 2 MG/ML IV SOLN
INTRAVENOUS | Status: DC | PRN
  Administered 2022-12-04: 12 mg via INTRAVENOUS

## 2022-12-04 MED ORDER — FENTANYL CITRATE (PF) 250 MCG/5ML IJ SOLN
INTRAMUSCULAR | Status: AC
  Filled 2022-12-04: qty 5

## 2022-12-04 MED ORDER — MEDIHONEY WOUND/BURN DRESSING EX PSTE
1.0000 | PASTE | Freq: Every day | CUTANEOUS | Status: AC
  Administered 2022-12-04 – 2022-12-24 (×22): 1 via TOPICAL
  Filled 2022-12-04 (×4): qty 44

## 2022-12-04 MED ORDER — FENTANYL CITRATE PF 50 MCG/ML IJ SOSY: 50.0000 ug | PREFILLED_SYRINGE | INTRAMUSCULAR | Status: DC | PRN

## 2022-12-04 MED ORDER — PANTOPRAZOLE SODIUM 40 MG IV SOLR
40.0000 mg | Freq: Every day | INTRAVENOUS | Status: DC
  Administered 2022-12-04 – 2022-12-13 (×10): 40 mg via INTRAVENOUS
  Filled 2022-12-04 (×9): qty 10

## 2022-12-04 MED ORDER — ALBUMIN HUMAN 5 % IV SOLN: INTRAVENOUS | Status: DC | PRN

## 2022-12-04 MED ORDER — LACTATED RINGERS IV SOLN: INTRAVENOUS | Status: DC | PRN

## 2022-12-04 MED ORDER — CALCIUM GLUCONATE-NACL 2-0.675 GM/100ML-% IV SOLN
2.0000 g | Freq: Once | INTRAVENOUS | Status: AC
  Administered 2022-12-04: 2000 mg via INTRAVENOUS
  Filled 2022-12-04: qty 100

## 2022-12-04 MED ORDER — ACETAMINOPHEN 10 MG/ML IV SOLN
INTRAVENOUS | Status: AC
  Filled 2022-12-04: qty 100

## 2022-12-04 MED ORDER — 0.9 % SODIUM CHLORIDE (POUR BTL) OPTIME
TOPICAL | Status: DC | PRN
  Administered 2022-12-04: 3000 mL

## 2022-12-04 MED ORDER — PROPOFOL 1000 MG/100ML IV EMUL: 5.0000 ug/kg/min | INTRAVENOUS | Status: DC

## 2022-12-04 MED ORDER — VASOPRESSIN 20 UNITS/100 ML INFUSION FOR SHOCK
0.0000 [IU]/min | INTRAVENOUS | Status: DC
  Administered 2022-12-04: .03 [IU]/min via INTRAVENOUS
  Filled 2022-12-04 (×2): qty 100

## 2022-12-04 MED ORDER — PROPOFOL 1000 MG/100ML IV EMUL
INTRAVENOUS | Status: AC
  Administered 2022-12-04: 5 ug/kg/min via INTRAVENOUS
  Filled 2022-12-04: qty 100

## 2022-12-04 MED ORDER — VASOPRESSIN 20 UNITS/100 ML INFUSION FOR SHOCK
0.0400 [IU]/min | INTRAVENOUS | Status: DC
  Administered 2022-12-04 – 2022-12-07 (×9): 0.04 [IU]/min via INTRAVENOUS
  Filled 2022-12-04 (×8): qty 100

## 2022-12-04 MED ORDER — QUETIAPINE FUMARATE 100 MG PO TABS
100.0000 mg | ORAL_TABLET | Freq: Every day | ORAL | Status: DC
  Administered 2022-12-04 – 2022-12-12 (×9): 100 mg
  Filled 2022-12-04 (×9): qty 1

## 2022-12-04 MED ORDER — FENTANYL BOLUS VIA INFUSION: 50.0000 ug | INTRAVENOUS | Status: DC | PRN

## 2022-12-04 NOTE — Anesthesia Procedure Notes (Addendum)
Arterial Line Insertion Start/End6/18/2024 12:31 AM, 12/04/2022 12:44 PM Performed by: [name], MD  Patient location: Pre-op. Preanesthetic checklist: patient identified, IV checked, site marked, risks and benefits discussed, surgical consent, monitors and equipment checked, pre-op evaluation, timeout performed and anesthesia consent Lidocaine 1% used for infiltration Left, radial was placed Catheter size: 20 G Hand hygiene performed , maximum sterile barriers used  and Seldinger technique used  Attempts: 2 Procedure performed using ultrasound guided technique. Ultrasound Notes:anatomy identified, needle tip was noted to be adjacent to the nerve/plexus identified, no ultrasound evidence of intravascular and/or intraneural injection and image(s) printed for medical record Following insertion, dressing applied and Biopatch. Post procedure assessment: normal and unchanged  Patient tolerated the procedure well with no immediate complications.

## 2022-12-04 NOTE — Progress Notes (Signed)
Initial Nutrition Assessment  DOCUMENTATION CODES:   Obesity unspecified  INTERVENTION:  Continue NPO status with NGT to suction per surgery.  If enteral nutrition can't be started in the next 3-5 days, recommend initiation of TPN in the context of increased needs for post-op recovery and wound healing  NUTRITION DIAGNOSIS:   Increased nutrient needs related to wound healing, post-op healing as evidenced by estimated needs.  GOAL:   Patient will meet greater than or equal to 90% of their needs  MONITOR:   I & O's, Skin, Vent status, Labs  REASON FOR ASSESSMENT:   Ventilator    ASSESSMENT:   Pt with hx of DM type 2, COPD, HTN, HLD, CHF, GERD, hx CVA, and osteomyelitis s/p Right AKA presented to ED from [facility] with severe abdominal pain. Workup in ED worrisome for bowel ischemia.  Pt with frequent admissions and has been followed by RD team in the past.  6/17 - admitted 6/18 - Op, Exploratory laparotomy, total abdominal colectomy with end ileostomy, returned to ICU intubated   Patient is currently intubated on ventilator support. NGT present, placed during surgery. No family present at bedside at the time of assessment to provide a hx.   Pt without signs of muscle or fat depletions on exam. However, nutrition needs are high in the context of his numerous skin issues and recent surgery. If enteral nutrition is not able to be initiated, would recommend TPN to start within 3-5 days of NPO status.   MV: 15.3 L/min Temp (24hrs), Avg:99.5 F (37.5 C), Min:97.2 F (36.2 C), Max:101.7 F (38.7 C)  Propofol: 3.36 ml/hr (89 kcal/d)   Intake/Output Summary (Last 24 hours) at 12/04/2022 1123 Last data filed at 12/04/2022 1036 Gross per 24 hour  Intake 7194.54 ml  Output 3010 ml  Net 4184.54 ml  Net IO Since Admission: 4,184.54 mL [12/04/22 1123]  Nutritionally Relevant Medications: Scheduled Meds:  insulin aspart  0-15 Units Subcutaneous Q4H   pantoprazole IV  40 mg  Intravenous QHS   Continuous Infusions:  epinephrine 2 mcg/min (12/04/22 0500)   LEVOPHED Adult infusion 19 mcg/min (12/04/22 0500)   phenylephrine Adult infusion 80 mcg/min (12/04/22 0500)   ZOSYN     propofol infusion 5 mcg/kg/min (12/04/22 0500)   sodium bicarbonate 150 mEq in D5 150 mL/hr at 12/04/22 1610   vasopressin 0.04 Units/min (12/04/22 0640)   PRN Meds:.docusate sodium, polyethylene glycol  Labs Reviewed: Na 134 BUN 50, creatinine 3.24 Phosphorus 5.2 Mg 2.9 CBG ranges from 174-253 mg/dL over the last 24 hours HgbA1c 6.9%  NUTRITION - FOCUSED PHYSICAL EXAM:  Flowsheet Row Most Recent Value  Orbital Region Unable to assess  Upper Arm Region Unable to assess  Thoracic and Lumbar Region Unable to assess  Buccal Region Unable to assess  Temple Region Unable to assess  Clavicle Bone Region Unable to assess  Clavicle and Acromion Bone Region Unable to assess  Scapular Bone Region Unable to assess  Dorsal Hand Unable to assess  Patellar Region Unable to assess  Anterior Thigh Region Unable to assess  Posterior Calf Region Unable to assess  Edema (RD Assessment) Mild  Hair Reviewed  Eyes Reviewed  Mouth Reviewed  Skin Reviewed  [right AKA, left leg with cratered appearance from the ankle to knee and some wounds to the ankle noted]  Nails Reviewed       Diet Order:   Diet Order             Diet NPO time specified  Diet effective now                   EDUCATION NEEDS:   Not appropriate for education at this time  Skin:  Skin Assessment: Reviewed RN Assessment Non pressure wound: 5 cm Stage 2: buttocks, 3 cm Stage 3: left thigh, 3 cm Surgical incision: abdomen  Last BM:  6/17 - type 3  Height:   Ht Readings from Last 1 Encounters:  11/27/22 6' (1.829 m)    Weight:   Wt Readings from Last 1 Encounters:  12/04/22 106.6 kg    Ideal Body Weight:  72.8 kg (adjusted by 10% for hx of R AKA)  BMI:  Body mass index is 35.4 kg/m. (Adjusted  by 10% for AKA, using 6/18 wt)  Estimated Nutritional Needs:   Kcal:  2300-2500 kcal/d  Protein:  120-140g/d  Fluid:  2.2L/d    Greig Castilla, RD, LDN Clinical Dietitian RD pager # available in [facility]  After hours/weekend pager # available in [facility]

## 2022-12-04 NOTE — Anesthesia Procedure Notes (Signed)
Procedure Name: Intubation Date/Time: 12/04/2022 1:04 AM  Performed by: Lucca Ballo T, CRNAPre-anesthesia Checklist: Patient identified, Emergency Drugs available, Suction available and Patient being monitored Patient Re-evaluated:Patient Re-evaluated prior to induction Oxygen Delivery Method: Circle system utilized Preoxygenation: Pre-oxygenation with 100% oxygen Induction Type: IV induction, Rapid sequence and Cricoid Pressure applied Ventilation: Mask ventilation without difficulty Laryngoscope Size: Glidescope and 3 Grade View: Grade I Tube type: Oral Tube size: 7.5 mm Number of attempts: 1 Airway Equipment and Method: Stylet and Oral airway Placement Confirmation: ETT inserted through vocal cords under direct vision, positive ETCO2 and breath sounds checked- equal and bilateral Secured at: 21 cm Tube secured with: Tape Dental Injury: Teeth and Oropharynx as per pre-operative assessment

## 2022-12-04 NOTE — Progress Notes (Signed)
[name] EKG monitor frequent PVC's. EKG showed 1st degree AV block. Dr. Francine Graven notified., similar EKG findings in April. No new orders. Will continue to monitor.

## 2022-12-04 NOTE — Transfer of Care (Signed)
Immediate Anesthesia Transfer of Care Note  Patient: Gregory [name]  Procedure(s) Performed: EXPLORATORY LAPAROTOMY  total abdominal colectomy with end ileostomy  Patient Location: ICU  Anesthesia Type:General  Level of Consciousness: Patient remains intubated per anesthesia plan  Airway & Oxygen Therapy: Patient remains intubated per anesthesia plan and Patient placed on Ventilator (see vital sign flow sheet for setting)  Post-op Assessment: Report given to RN and Post -op Vital signs reviewed and stable  Post vital signs: Reviewed and stable  Last Vitals:  Vitals Value Taken Time  BP    Temp 36.2 C 12/04/22 0411  Pulse 84 12/04/22 0411  Resp 18 12/04/22 0411  SpO2 94 % 12/04/22 0411  Vitals shown include unvalidated device data.  Last Pain:  Vitals:   12/03/22 2030  TempSrc: Bladder  PainSc:          Complications: No notable events documented.

## 2022-12-04 NOTE — Progress Notes (Signed)
NAME:  Gregory [name], MRN:  [id number], DOB:  04-Dec-1960, LOS: 1 ADMISSION DATE:  12/03/2022, CONSULTATION DATE:  12/04/2022 REFERRING MD:  EDP, CHIEF COMPLAINT:  abdominal pain   History of Present Illness:  Gregory [name] is a 62 y.o. man with COPD on home oxygen and DM2 s/p RLE AKA who is a SNF resident at [facility] with frequent admissions to the hospital. Here with abdominal pain, and urinary incontinence. He reports excruciating abdominal pain that started earlier today, along with constipation recently.   Pertinent  Medical History  S/p RLE AKA DM2 COPD on home oxygen  Significant Hospital Events: Including procedures, antibiotic start and stop dates in addition to other pertinent events   6/17 admitted, s/p exploratory laparotomy for total colectomy due to ischemic colon  Interim History / Subjective:   Patient is intubated - RR increased over night from 16 to 26. Levo 38mcg/min Epi 2 mcg/min Neo 2mcg/min Vaso 0.04units/min Bicarb infusion at 158ml/hr  He was transfused 1units PRBCs overnight  Propofol turned off this morning, patient does arouse - nods head yes to being in pain  Objective   Blood pressure (!) 170/71, pulse 84, temperature 99.7 F (37.6 C), resp. rate (!) 26, weight 106.6 kg, SpO2 97 %.    Vent Mode: PRVC FiO2 (%):  [40 %-60 %] 40 % Set Rate:  [16 bmp-26 bmp] 26 bmp Vt Set:  [600 mL] 600 mL PEEP:  [5 cmH20] 5 cmH20 Plateau Pressure:  [18 cmH20-23 cmH20] 23 cmH20   Intake/Output Summary (Last 24 hours) at 12/04/2022 0759 Last data filed at 12/04/2022 7829 Gross per 24 hour  Intake 6519.67 ml  Output 2465 ml  Net 4054.67 ml   Filed Weights   12/04/22 0435  Weight: 106.6 kg   Examination: General: chronically ill appearing male, mild distress, intubated HENT: moist mucous membranes, sclera anicteric Lungs: mechanical breath sounds, no wheezing Cardiovascular: rrr, no murmurs Abdomen: mild tenderness, mildly distended, mid line incision -  clean, bowel sounds absent Extremities: RLE AKA, left foot open wound - amputated digist on left foot Neuro: right pupil reactive to light, left pupil not reactive to light, follows simple commands, wiggles right fingers - not wiggling right fingers GU: foley in place  Resolved Hospital Problem list     Assessment & Plan:  Septic Shock due to ischemic colitis s/p total colectomy 6/17 - MAP goal 65 or higher - Weaned off epi this morning, remains on vaso, neo, levo. Wean off neo next. 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 Hold home meds  BPH - holding flomax, may need to resume during this admission  Neuropathy - holding home meds for now given AKI  PVD HTN HLD Holding metoprolol, statin, asa for now  Best Practice (right click and "Reselect all SmartList Selections" daily)   Diet/type: NPO DVT prophylaxis: prophylactic heparin  GI prophylaxis: PPI Lines: Central line, Arterial Line, and yes  and it is still needed Foley:  Yes, and it is still needed Code Status:  full code Last date of multidisciplinary goals of care discussion [ pending ]  Labs   CBC: Recent Labs  Lab 12/03/22 1654 12/03/22 1715 12/04/22 0154 12/04/22 0221 12/04/22 0414 12/04/22 0424 12/04/22 0601  WBC 28.7*  --   --   --  7.4  --   --   NEUTROABS 25.6*  --   --   --   --   --   --   HGB 14.4   < > 11.2* 8.8* 10.5* 10.9* 11.6*   HCT 47.3   < > 33.0* 26.0* 34.4* 32.0* 34.0*  MCV 90.8  --   --   --  89.6  --   --   PLT 500*  --   --   --  240  --   --    < > = values in this interval not displayed.    Basic Metabolic Panel: Recent Labs  Lab 12/03/22 1654 12/03/22 1715 12/04/22 0154 12/04/22 0221 12/04/22 0414 12/04/22 0424 12/04/22 0601  NA 133* 133* 134* 136 134* 137 134*  K 4.8 4.7 4.3 3.9 3.8 3.9 4.1  CL 98 105  --   --  102  --   --   CO2 15*  --   --   --  20*  --   --   GLUCOSE 269* 277*  --   --  268*  --   --   BUN 51* 48*  --   --  50*  --   --   CREATININE 3.38* 3.30*  --   --  3.24*  --   --   CALCIUM 9.3  --   --   --  7.2*  --   --   MG  --   --   --   --  2.9*  --   --   PHOS  --   --   --   --  5.2*  --   --    GFR: Estimated Creatinine Clearance: 29.4 mL/min (A) (by C-G formula based on SCr of 3.24 mg/dL (H)). Recent Labs  Lab 12/03/22 1654 12/03/22 1920 12/03/22 2040 12/03/22 2240 12/04/22 0414  WBC 28.7*  --   --   --  7.4  LATICACIDVEN 3.1* 4.1* 6.1* 8.6* 3.2*    Liver Function Tests: Recent Labs  Lab 12/03/22 1654  AST 24  ALT 18  ALKPHOS 78  BILITOT 1.0  PROT 8.4*  ALBUMIN 3.5   Recent Labs  Lab 12/03/22 1654  LIPASE 21   No results for input(s): "AMMONIA" in the last 168 hours.  ABG    Component Value Date/Time   PHART 7.292 (L) 12/04/2022 0601   PCO2ART 38.9 12/04/2022 0601   PO2ART 105 12/04/2022 0601   HCO3 18.8 (L) 12/04/2022 0601   TCO2 20 (L) 12/04/2022 0601   ACIDBASEDEF 7.0 (H) 12/04/2022 0601   O2SAT 97 12/04/2022 0601     Coagulation Profile: Recent Labs  Lab 12/03/22 1654  INR 1.1    Cardiac Enzymes: No results for input(s): "CKTOTAL", "CKMB", "CKMBINDEX", "TROPONINI" in the last 168 hours.  HbA1C: Hemoglobin A1C  Date/Time Value Ref Range Status  08/14/2016 12:00 AM 5.6  Final  01/18/2016 12:00 AM 5.5  Final   Hgb A1c MFr Bld  Date/Time Value Ref Range Status  12/03/2022 04:54 PM 6.9 (H) 4.8 - 5.6 % Final    Comment:     (  NOTE) Pre diabetes:          5.7%-6.4%  Diabetes:              >6.4%  Glycemic control for   <7.0% adults with diabetes   05/01/2022 08:13 AM 6.9 (H) 4.8 - 5.6 % Final    Comment:    (NOTE) Pre diabetes:          5.7%-6.4%  Diabetes:              >6.4%  Glycemic control for   <7.0% adults with diabetes     CBG: Recent Labs  Lab 12/03/22 2359 12/04/22 0334 12/04/22 0712  GLUCAP 199* 174* 253*    Review of Systems:   Abdominal pain,  constipation  Past Medical History:  He,  has a past medical history of Acquired absence of left great toe (HCC), Acquired absence of right leg below knee (HCC), Acute osteomyelitis of calcaneum, right (HCC) (10/02/2016), Acute respiratory failure (HCC), Amputation stump infection (HCC) (08/12/2018), Anemia, Basal cell carcinoma, eyelid, Cancer (HCC), Candidiasis (08/12/2018), CHF (congestive heart failure) (HCC), Chronic back pain, Chronic kidney disease, Chronic multifocal osteomyelitis (HCC), Chronic pain syndrome, COPD (chronic obstructive pulmonary disease) (HCC), DDD (degenerative disc disease), lumbar, Depression, Diabetes mellitus without complication (HCC), Diabetic ulcer of toe of left foot (HCC) (10/02/2016), Difficult intubation, Difficulty in walking, not elsewhere classified, 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), Foot drop, bilateral, Foot drop, right (12/27/2015), GERD (gastroesophageal reflux disease), Herpesviral vesicular dermatitis, History of COVID-19 (03/15/2022), Hyperlipidemia, Hyperlipidemia, Hypertension, Insomnia, Malignant melanoma of other parts of face (HCC), Melanoma (HCC), MRSA bacteremia, Muscle weakness (generalized), Nasal congestion, Necrosis of toe (HCC) (07/08/2018), Neuromuscular dysfunction of bladder, Osteomyelitis (HCC), Osteomyelitis (HCC), Osteomyelitis (HCC), Other idiopathic peripheral autonomic neuropathy, Retention of urine, unspecified, Squamous cell carcinoma of skin, Stroke (HCC), Unsteadiness on feet, Urinary  retention, Urinary retention, Urinary tract infection, Wears dentures, and Wears glasses.   Surgical History:   Past Surgical History:  Procedure Laterality Date   AMPUTATION Left 03/29/2017   Procedure: LEFT GREAT TOE AMPUTATION AT METATARSOPHALANGEAL JOINT;  Surgeon: Nadara Mustard, MD;  Location: [facility] OR;  Service: Orthopedics;  Laterality: Left;   AMPUTATION Right 03/29/2017   Procedure: RIGHT BELOW KNEE AMPUTATION;  Surgeon: Nadara Mustard, MD;  Location: [facility] OR;  Service: Orthopedics;  Laterality: Right;   AMPUTATION Left 06/06/2018   Procedure: LEFT 2ND TOE AMPUTATION;  Surgeon: Nadara Mustard, MD;  Location: [facility] OR;  Service: Orthopedics;  Laterality: Left;   AMPUTATION Right 11/19/2018   Procedure: AMPUTATION ABOVE KNEE;  Surgeon: Nadara Mustard, MD;  Location: [facility] OR;  Service: Orthopedics;  Laterality: Right;   ANTERIOR CERVICAL CORPECTOMY N/A 11/25/2015   Procedure: ANTERIOR CERVICAL FIVE CORPECTOMY Cervical four - six fusion;  Surgeon: Lisbeth Renshaw, MD;  Location: [facility] OR[facility];  Service: Neurosurgery;  Laterality: N/A;  ANTERIOR CERVICAL FIVE CORPECTOMY Cervical four - six fusion   APPLICATION OF WOUND VAC Right 10/22/2018   Procedure: Application Of Wound Vac;  Surgeon: Nadara Mustard, MD;  Location: [facility] OR;  Service: Orthopedics;  Laterality: Right;   CYSTOSCOPY WITH BIOPSY N/A 05/01/2022   Procedure: CYSTOSCOPY WITH URETHRAL BIOPSY;  Surgeon: Noel Christmas, MD;  Location: [facility] ORS;  Service: Urology;  Laterality: N/A;  1 HR   CYSTOSCOPY WITH RETROGRADE URETHROGRAM N/A 04/25/2018   Procedure: CYSTOSCOPY WITH RETROGRADE URETHROGRAM/ BALLOON DILATION;  Surgeon: Ihor Gully, MD;  Location: [facility] ORS;  Service: Urology;  Laterality: N/A;   CYSTOSCOPY WITH URETHRAL DILATATION N/A 05/01/2022  Procedure: BALLOON DILATION WITH  OPTILUME;  Surgeon: Noel Christmas, MD;  Location: [facility] ORS;  Service: Urology;  Laterality: N/A;   MULTIPLE TOOTH EXTRACTIONS     POSTERIOR CERVICAL  FUSION/FORAMINOTOMY N/A 11/29/2015   Procedure: Cervical Three-Cervical Seven Posterior Cervical Laminectomy with Fusion;  Surgeon: Lisbeth Renshaw, MD;  Location: [facility];  Service: Neurosurgery;  Laterality: N/A;  Cervical Three-Cervical Seven Posterior Cervical Laminectomy with Fusion   STUMP REVISION Right 10/22/2018   Procedure: REVISION RIGHT BELOW KNEE AMPUTATION;  Surgeon: Nadara Mustard, MD;  Location: [facility] OR;  Service: Orthopedics;  Laterality: Right;   STUMP REVISION Right 12/05/2018   Procedure: Revision Right Above Knee Amputation;  Surgeon: Nadara Mustard, MD;  Location: [facility] OR;  Service: Orthopedics;  Laterality: Right;   STUMP REVISION Right 05/29/2019   Procedure: REVISION RIGHT ABOVE KNEE AMPUTATION;  Surgeon: Nadara Mustard, MD;  Location: [facility] OR;  Service: Orthopedics;  Laterality: Right;   STUMP REVISION Right 06/24/2019   Procedure: STUMP REVISION;  Surgeon: Nadara Mustard, MD;  Location: [facility] OR;  Service: Orthopedics;  Laterality: Right;     Social History:   reports that he has been smoking cigarettes. He has been smoking an average of .5 packs per day. He has never used smokeless tobacco. He reports that he does not drink alcohol and does not use drugs.   Family History:  His family history includes Bone cancer in his father; Cancer in his father, mother, paternal grandfather, and another family member; Diabetes in his mother and another family member; Heart disease in his mother; Prostate cancer in his father.   Allergies Allergies  Allergen Reactions   Latex Other (See Comments)    "ALLERGIC," per [facility]     Home Medications  Prior to Admission medications   Medication Sig Start Date End Date Taking? Authorizing Provider  aspirin EC 81 MG tablet Take 81 mg by mouth daily. Swallow whole.    [provider]  atorvastatin (LIPITOR) 80 MG tablet Take 1 tablet (80 mg total) by mouth daily. 09/22/22 01/27/24  Jerald Kief, MD  cetirizine (ZYRTEC) 10 MG tablet  Take 10 mg by mouth daily.    [provider]  Cholecalciferol 25 MCG (1000 UT) capsule Take 1,000 Units by mouth daily.    [provider]  Cranberry 450 MG CAPS Take 1 tablet by mouth daily.    [provider]  cyanocobalamin 1000 MCG tablet Take 1,000 mcg by mouth daily.    [provider]  Dextran 70-Hypromellose (NATURAL BALANCE TEARS) 0.1-0.3 % SOLN Apply 1 drop to eye every 4 (four) hours as needed (dry eyes).    [provider]  docusate sodium (COLACE) 100 MG capsule Take 1 capsule (100 mg total) by mouth 2 (two) times daily. 05/31/19   Amin, Loura Halt, MD  escitalopram (LEXAPRO) 20 MG tablet Take 20 mg by mouth daily.    [provider]  fenofibrate 54 MG tablet Take 1 tablet (54 mg total) by mouth daily. 09/22/22 01/28/24  Jerald Kief, MD  ferrous sulfate 325 (65 FE) MG tablet Take 1 tablet (325 mg total) by mouth daily with breakfast. 11/23/18   Mirian Mo, MD  fluticasone [facility]) 50 MCG/ACT nasal spray Place 1 spray into both nostrils in the morning.    [provider]  hydrOXYzine (ATARAX) 25 MG tablet Take 25 mg by mouth every 6 (six) hours as needed.    [provider]  insulin glargine (LANTUS  SOLOSTAR) 100 UNIT/ML Solostar Pen Inject 15 Units into the skin daily. Patient taking differently: Inject 15 Units into the skin at bedtime. 09/07/22   Rai, Ripudeep K, MD  insulin lispro (HUMALOG) 100 UNIT/ML KwikPen Inject 0-10 Units into the skin See admin instructions. Inject 0-10 units into the skin three times a day and at bedtime, PER SLIDING SCALE:  BGL 151-200 = 2 units 201-250 = 4 units 251-300 = 6 units 301-350 =  8 units 351-400 = 10 units Patient taking differently: Inject 0-10 Units into the skin See admin instructions. Inject 0-10 units into the skin before meals and at bedtime, PER SLIDING SCALE:  BGL 151-200 = 2 units 201-250 = 4 units 251-300 = 6 units 301-350 =  8 units 351-400 = 10 units  01/31/22   Dahal, Melina Schools, MD  ipratropium-albuterol (DUONEB) 0.5-2.5 (3) MG/3ML SOLN Take 3 mLs by nebulization every 6 (six) hours as needed (for shortness of breath).    [provider]  lactulose (CHRONULAC) 10 GM/15ML solution Take 15 mLs (10 g total) by mouth 3 (three) times daily as needed for moderate constipation. Patient taking differently: Take 10 g by mouth every 8 (eight) hours as needed for moderate constipation. 01/31/22   Lorin Glass, MD  magnesium oxide (MAG-OX) 400 MG tablet Take 400 mg by mouth 2 (two) times daily.    [provider]  melatonin 3 MG TABS tablet Take 3 mg by mouth at bedtime.    [provider]  metoprolol tartrate (LOPRESSOR) 25 MG tablet Take 0.5 tablets (12.5 mg total) by mouth 2 (two) times daily. 07/18/19 01/27/24  Zigmund Daniel., MD  mirabegron ER (MYRBETRIQ) 50 MG TB24 tablet Take 50 mg by mouth daily.    [provider]  naloxone [facility]) 4 MG/0.1ML LIQD nasal spray kit Place 1 spray into the nose as needed (poorly responsive or turning blue).    [provider]  omeprazole (PRILOSEC) 20 MG capsule Take 20 mg by mouth at bedtime.    [provider]  ondansetron (ZOFRAN) 4 MG tablet Take 4 mg by mouth every 8 (eight) hours as needed for nausea.    [provider]  Oxycodone HCl 20 MG TABS Take 1 tablet (20 mg total) by mouth every 8 (eight) hours as needed (severe pain). 09/21/22   Jerald Kief, MD  phenazopyridine (PYRIDIUM) 100 MG tablet Take 100 mg by mouth every 8 (eight) hours as needed for pain.    [provider]  polyethylene glycol (MIRALAX / GLYCOLAX) 17 g packet Take 17 g by mouth daily as needed for mild constipation.    [provider]  pregabalin (LYRICA) 100 MG capsule Take 100 mg by mouth 2 (two) times daily.    [provider]  prochlorperazine (COMPAZINE) 10 MG tablet Take 10 mg by mouth every 6 (six) hours as needed for nausea or vomiting.     [provider]  QUEtiapine (SEROQUEL) 100 MG tablet Take 100 mg by mouth at bedtime.    [provider]  sennosides-docusate sodium (SENOKOT-S) 8.6-50 MG tablet Take 1 tablet by mouth at bedtime.    [provider]  tamsulosin (FLOMAX) 0.4 MG CAPS capsule Take 0.4 mg by mouth daily. 11/26/13   [provider]  trolamine salicylate (ASPERCREME) 10 % cream Apply 1 Application topically every 6 (six) hours as needed for muscle pain.    [provider]  TRULICITY 0.75 MG/0.5ML SOPN Inject 0.75 mg into the skin every  Wednesday.    [provider]     Critical care time: 60 min    The patient is critically ill due to lactic acidosis, sepsis.  Critical care was necessary to treat or prevent imminent or life-threatening deterioration.  Critical care was time spent personally by me on the following activities: development of treatment plan with patient and/or surrogate as well as nursing, discussions with consultants, evaluation of patient's response to treatment, examination of patient, obtaining history from patient or surrogate, ordering and performing treatments and interventions, ordering and review of laboratory studies, ordering and review of radiographic studies, pulse oximetry, re-evaluation of patient's condition and participation in multidisciplinary rounds.   Critical Care Time devoted to patient care services described in this note is 45 minutes. This time reflects time of care of this signee Martina Sinner . This critical care time does not reflect separately billable procedures or procedure time, teaching time or supervisory time of PA/NP/Med student/Med Resident etc but could involve care discussion time.       Martina Sinner [facility] 12/04/2022 7:59 AM  Pager: see AMION  If no response to pager , please call critical care on call (see AMION) until 7pm After 7:00 pm call Elink

## 2022-12-04 NOTE — Progress Notes (Addendum)
eLink Physician-Brief Progress Note Patient Name: ABDULHAMID [name] DOB: [birth year] MRN: [id number]   Date of Service  12/04/2022  HPI/Events of Note  62 year old male with a history of COPD on home oxygen, diabetes status complicated by peripheral arterial disease status post right AKA lives in a nursing home and presented initially with abdominal pain, urinary incontinence and constipation found to have ischemic bowel and underwent emergent surgical intervention.  In the operating room, he underwent total abdominal colectomy with end ileostomy in the setting of ischemic colon.  He returned from the operating room on multiple pressors, intubated, sedated with arterial line and central line in place.  He is hypotensive requiring multiple pressors to maintain MAP of 60.  He is ventilated with FiO2 60% and a rate increased from 16->26.  Postoperative laboratory studies are pending.  Evidence of combined metabolic and respiratory acidosis.  Cultures have been obtained.  eICU Interventions  Ordered the missing drips.  Titrate off phenylephrine, maintain epinephrine, norepinephrine, vasopressin.  Add bicarbonate drip.  Increase respiratory rate from 16-26.  Add calcium infusion.  Pending remaining labs, surgery is ordered 2 units of PRBC infusion.  Add pantoprazole for GI prophylaxis.  DVT prophylaxis with heparin subcutaneous.    4098 -spoke with and updated patient's daughter about current clinical status-he remains critically ill, but given source control the hope is that he has a progressive improvement.  She has an infant and toddler at home and would have a hard time coming into the hospital without making arrangements.  Questions answered. 0622 -CBC with a hemoglobin of 10.5, EBL 200 cc and he has received 1 unit of PRBC already since the CBC was drawn.  Hemodynamics unchanged.  Hold the second unit until primary team can reevaluate.  ABG reviewed with better ventilation, improved  pH.  Intervention Category Evaluation Type: New Patient Evaluation  Sarann Tregre 12/04/2022, 4:48 AM

## 2022-12-04 NOTE — Progress Notes (Signed)
1 Day Post-Op  Subjective: Patient sedated on vent.  Will arouse to pain. On levo and vaso, weaned off of epi and neo   Objective: Vital signs in last 24 hours: Temp:  [97.2 F (36.2 C)-101.7 F (38.7 C)] 99.5 F (37.5 C) (06/18 0915) Pulse Rate:  [71-132] 79 (06/18 0915) Resp:  [15-39] 23 (06/18 0845) BP: (81-170)/(53-119) 170/71 (06/18 0741) SpO2:  [88 %-100 %] 94 % (06/18 0915) Arterial Line BP: (99-184)/(46-88) 109/55 (06/18 0915) FiO2 (%):  [40 %-60 %] 40 % (06/18 0744) Weight:  [106.6 kg] 106.6 kg (06/18 0435) Last BM Date : 12/04/22  Intake/Output from previous day: 06/17 0701 - 06/18 0700 In: 6932.7 [I.V.:5054.8; Blood:283.3; IV Piggyback:1594.6] Out: 2465 [Urine:1025; Drains:290; Blood:50] Intake/Output this shift: Total I/O In: 187.7 [I.V.:179.2; IV Piggyback:8.6] Out: 125 [Urine:125]  PE: Gen: appears critically ill Abd: obese, wound is clean and packed. JP drain with copious serosang fluid (suspect this is likely secondary to irrigant used during case), NGT in place but clamped off.  Ileostomy is viable with some output already, slightly blood-tinged.  Lab Results:  Recent Labs    12/04/22 0414 12/04/22 0424 12/04/22 0601 12/04/22 0941  WBC 7.4  --   --  8.9  HGB 10.5*   < > 11.6* 12.4*  HCT 34.4*   < > 34.0* 39.8  PLT 240  --   --  253   < > = values in this interval not displayed.   BMET Recent Labs    12/03/22 1654 12/03/22 1715 12/04/22 0154 12/04/22 0414 12/04/22 0424 12/04/22 0601  NA 133* 133*   < > 134* 137 134*  K 4.8 4.7   < > 3.8 3.9 4.1  CL 98 105  --  102  --   --   CO2 15*  --   --  20*  --   --   GLUCOSE 269* 277*  --  268*  --   --   BUN 51* 48*  --  50*  --   --   CREATININE 3.38* 3.30*  --  3.24*  --   --   CALCIUM 9.3  --   --  7.2*  --   --    < > = values in this interval not displayed.   PT/INR Recent Labs    12/03/22 1654  LABPROT 13.9  INR 1.1   CMP     Component Value Date/Time   NA 134 (L)  12/04/2022 0601   NA 138 10/22/2016 0000   K 4.1 12/04/2022 0601   CL 102 12/04/2022 0414   CO2 20 (L) 12/04/2022 0414   GLUCOSE 268 (H) 12/04/2022 0414   BUN 50 (H) 12/04/2022 0414   BUN 26 (A) 10/22/2016 0000   CREATININE 3.24 (H) 12/04/2022 0414   CREATININE 1.95 (H) 02/24/2020 0938   CREATININE 2.67 (H) 10/02/2016 1651   CALCIUM 7.2 (L) 12/04/2022 0414   PROT 8.4 (H) 12/03/2022 1654   ALBUMIN 3.5 12/03/2022 1654   AST 24 12/03/2022 1654   AST 8 (L) 02/24/2020 0938   ALT 18 12/03/2022 1654   ALT 8 02/24/2020 0938   ALKPHOS 78 12/03/2022 1654   BILITOT 1.0 12/03/2022 1654   BILITOT 0.3 02/24/2020 0938   GFRNONAA 21 (L) 12/04/2022 0414   GFRNONAA 36 (L) 02/24/2020 0938   GFRNONAA 25 (L) 10/02/2016 1651   GFRAA 42 (L) 02/24/2020 0938   GFRAA 29 (L) 10/02/2016 1651   Lipase     Component  Value Date/Time   LIPASE 21 12/03/2022 1654       Studies/Results: DG Chest Port 1 View  Result Date: 12/03/2022 CLINICAL DATA:  Possible sepsis EXAM: PORTABLE CHEST 1 VIEW COMPARISON:  09/18/2022 FINDINGS: Cardiac shadow is stable. Lungs are well aerated bilaterally. No focal infiltrate or effusion is seen. Postsurgical changes in the cervical spine are noted. IMPRESSION: No active disease. Electronically Signed   By: Alcide Clever M.D.   On: 12/03/2022 19:32   CT Angio Chest PE W and/or Wo Contrast  Result Date: 12/03/2022 CLINICAL DATA:  Hypoxia with shortness of breath and tachycardia Generalized abdominal pain and distension.  Sepsis. EXAM: CT ANGIOGRAPHY CHEST CT ABDOMEN AND PELVIS WITH CONTRAST TECHNIQUE: Multidetector CT imaging of the chest was performed using the standard protocol during bolus administration of intravenous contrast. Multiplanar CT image reconstructions and MIPs were obtained to evaluate the vascular anatomy. Multidetector CT imaging of the abdomen and pelvis was performed using the standard protocol during bolus administration of intravenous contrast. RADIATION DOSE  REDUCTION: This exam was performed according to the departmental dose-optimization program which includes automated exposure control, adjustment of the mA and/or kV according to patient size and/or use of iterative reconstruction technique. CONTRAST:  75mL OMNIPAQUE IOHEXOL 350 MG/ML SOLN COMPARISON:  07/04/2021 chest CT FINDINGS: CTA CHEST FINDINGS Cardiovascular: Satisfactory opacification of the pulmonary arteries to the segmental level. No evidence of pulmonary embolism. Normal heart size. No pericardial effusion. Atherosclerosis affecting the aorta and coronaries. Mediastinum/Nodes: Negative for mass or adenopathy Lungs/Pleura: Patchy haziness of the bilateral lungs, likely atelectasis. No consolidation, edema, effusion, or pneumothorax. There is some mucoid impaction within left lower lobe airways. Musculoskeletal: No acute or aggressive finding Review of the MIP images confirms the above findings. CT ABDOMEN and PELVIS FINDINGS Hepatobiliary: Subcentimeter low-density in the central liver, too small for accurate densitometry.Cholelithiasis. No evidence of biliary obstruction or inflammation. Pancreas: Unremarkable. Spleen: Unremarkable. Adrenals/Urinary Tract: Negative adrenals. No hydronephrosis or stone. Collapsed urinary bladder around a Foley catheter. Stomach/Bowel: Diffuse distension of the colon. Some mild pericholecystic fat edema is present distally. Fluid levels are seen proximally and desiccated stool distally. No volvulus or discrete obstructing lesion is seen. Despite distal desiccated stool, no convincing rectal impaction. Vascular/Lymphatic: No acute vascular abnormality. No mass or adenopathy. Reproductive:No pathologic findings. Other: No ascites or pneumoperitoneum. Musculoskeletal: No acute abnormalities. Muscular atrophy atraumatically affecting the right hip musculature. Generalized lumbar spine degeneration. Review of the MIP images confirms the above findings. IMPRESSION: Chest CTA: 1.  Negative for pulmonary embolism or other acute finding. 2. Atherosclerosis including the coronary arteries. Abdominal CT: 1. Diffuse colonic distension and tortuosity. There is desiccated stool distally but the overall pattern favors adynamic ileus over mechanical obstruction. 2. Cholelithiasis and atherosclerosis. Electronically Signed   By: Tiburcio Pea M.D.   On: 12/03/2022 19:12   CT ABDOMEN PELVIS W CONTRAST  Result Date: 12/03/2022 CLINICAL DATA:  Hypoxia with shortness of breath and tachycardia Generalized abdominal pain and distension.  Sepsis. EXAM: CT ANGIOGRAPHY CHEST CT ABDOMEN AND PELVIS WITH CONTRAST TECHNIQUE: Multidetector CT imaging of the chest was performed using the standard protocol during bolus administration of intravenous contrast. Multiplanar CT image reconstructions and MIPs were obtained to evaluate the vascular anatomy. Multidetector CT imaging of the abdomen and pelvis was performed using the standard protocol during bolus administration of intravenous contrast. RADIATION DOSE REDUCTION: This exam was performed according to the departmental dose-optimization program which includes automated exposure control, adjustment of the mA and/or kV according to patient  size and/or use of iterative reconstruction technique. CONTRAST:  75mL OMNIPAQUE IOHEXOL 350 MG/ML SOLN COMPARISON:  07/04/2021 chest CT FINDINGS: CTA CHEST FINDINGS Cardiovascular: Satisfactory opacification of the pulmonary arteries to the segmental level. No evidence of pulmonary embolism. Normal heart size. No pericardial effusion. Atherosclerosis affecting the aorta and coronaries. Mediastinum/Nodes: Negative for mass or adenopathy Lungs/Pleura: Patchy haziness of the bilateral lungs, likely atelectasis. No consolidation, edema, effusion, or pneumothorax. There is some mucoid impaction within left lower lobe airways. Musculoskeletal: No acute or aggressive finding Review of the MIP images confirms the above findings. CT  ABDOMEN and PELVIS FINDINGS Hepatobiliary: Subcentimeter low-density in the central liver, too small for accurate densitometry.Cholelithiasis. No evidence of biliary obstruction or inflammation. Pancreas: Unremarkable. Spleen: Unremarkable. Adrenals/Urinary Tract: Negative adrenals. No hydronephrosis or stone. Collapsed urinary bladder around a Foley catheter. Stomach/Bowel: Diffuse distension of the colon. Some mild pericholecystic fat edema is present distally. Fluid levels are seen proximally and desiccated stool distally. No volvulus or discrete obstructing lesion is seen. Despite distal desiccated stool, no convincing rectal impaction. Vascular/Lymphatic: No acute vascular abnormality. No mass or adenopathy. Reproductive:No pathologic findings. Other: No ascites or pneumoperitoneum. Musculoskeletal: No acute abnormalities. Muscular atrophy atraumatically affecting the right hip musculature. Generalized lumbar spine degeneration. Review of the MIP images confirms the above findings. IMPRESSION: Chest CTA: 1. Negative for pulmonary embolism or other acute finding. 2. Atherosclerosis including the coronary arteries. Abdominal CT: 1. Diffuse colonic distension and tortuosity. There is desiccated stool distally but the overall pattern favors adynamic ileus over mechanical obstruction. 2. Cholelithiasis and atherosclerosis. Electronically Signed   By: Tiburcio Pea M.D.   On: 12/03/2022 19:12    Anti-infectives: Anti-infectives (From admission, onward)    Start     Dose/Rate Route Frequency Ordered Stop   12/05/22 1800  vancomycin (VANCOREADY) IVPB 1250 mg/250 mL        1,250 mg 166.7 mL/hr over 90 Minutes Intravenous Every 48 hours 12/03/22 2346     12/04/22 0100  piperacillin-tazobactam (ZOSYN) IVPB 3.375 g        3.375 g 12.5 mL/hr over 240 Minutes Intravenous Every 8 hours 12/03/22 2346     12/03/22 1700  vancomycin (VANCOREADY) IVPB 2000 mg/400 mL        2,000 mg 200 mL/hr over 120 Minutes  Intravenous  Once 12/03/22 1657 12/03/22 2020   12/03/22 1700  piperacillin-tazobactam (ZOSYN) IVPB 3.375 g        3.375 g 100 mL/hr over 30 Minutes Intravenous  Once 12/03/22 1657 12/03/22 1804        Assessment/Plan POD 0, 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 Sheliah Hatch 12/04/22 -NS WD dressing changes to midline wound BID -cont abx therapy, zosyn/vanc add -NGT to LIWS, await bowel function -weaning pressors today -cont JP drain, not surprised by large SS output currently -WOC following for new ileostomy -hold any stool softeners for now.  Will likely end up need fiber/imodium with ileostomy eventually.  Will monitor this need.     FEN - NGT/IVFs VTE - heparin ID - vanc/zosyn  Septic shock - weaning pressors, per CCM VDRF - per CCM MMP - per CCM   LOS: 1 day    Letha Cape , [facility] 12/04/2022, 10:11 AM Please see Amion for pager number during day hours 7:00am-4:30pm or 7:00am -11:30am on weekends

## 2022-12-04 NOTE — Op Note (Signed)
Preoperative diagnosis: septic shock  Postoperative diagnosis: ischemic colon  Procedure: total abdominal colectomy with end ileostomy  Surgeon: Feliciana Rossetti, M.D.  Asst: none  Anesthesia: GETA  Indications for procedure: Gregory [name] is a 62 y.o. year old male with symptoms of abdominal pain, fever, and findings concerning for sepsis. His colon appeared dilated on imaging. Due to physical exam and worsening lactic acidosis he was brought to the OR for exploration.  Description of procedure: The patient was brought into the operative suite. Anesthesia was administered with General endotracheal anesthesia. WHO checklist was applied. The patient was then placed in supine position. The area was prepped and draped in the usual sterile fashion.  Next a midline incision was made cautery was used dissect down through subcutaneous tissues.  When the fascia was entered in the midline and the peritoneum was entered, the colon appeared very dilated and a good portion of the colon was green in color.  Dissection began in the sigmoid area.  A window was able to be placed in the distal sigmoid area and 100 mm GIA stapler was used to divide the colon.  LigaSure was used to take the mesentery working proximally.  The white line of Toldt was taken down with cautery.  Due to the level of the colon distention dissection was difficult.  Therefore, the distal end of the sigmoid colon which would eventually be removed was opened and more than a liter of feces was poured into a container.  This allowed better mobilization of the colon.  Splenic flexure was still difficult to reach.    Therefore attention was turned towards the right colon.  Portion of the right colon appeared ischemic and why initially I thought I could save it for a colostomy the decision was made to perform a total abdominal colectomy.  The terminal ileum was divided with a blue load GIA stapler.  LigaSure was used to take the mesentery of the right  colon white line of Toldt was incised with cautery.  Hepatic flexure was taken down the mesentery continued be taken with LigaSure.  Care was made to ensure the duodenum was well away from the dissection area.  This was continued across the transverse colon.  This allowed improved mobilization of splenic flexure and the splenic flexure was able to be taken with cautery.  The entire abdominal colon was removed.    The abdomen was irrigated with saline.  The portion of the remaining distal sigmoid colon was still quite large and decision was made to take an additional portion additional dissection along the rectosigmoid junction was performed and 100 mm GIA stapler was used to divide at the rectosigmoid junction.  Hemostasis was ensured with cautery.  A 19 [place] Blake drain was placed through the left abdominal wall with tip into the pelvis.  A circular incision was made in the right upper abdomen cautery was used dissect down through subcutaneous tissues the fascia was entered in a cruciate format the rectus muscle was split longitudinally and the peritoneum and posterior rectus were divided.  The terminal ileum was brought through this hole.    Next, the fascia was closed with 0 PDS in running fashion.  The ostomy was matured with brought to 3 cm of brooking using 3-0 Vicryl.  Saline moistened Kerlix was placed in the midline wound.  Patient was brought to the ICU and continued critical condition.  Findings: ischemic colon  Specimen: colon  Implant: 19 Fr blake drain in left abdomen with tip  in the pelvis   Blood loss: 200 ml  Local anesthesia: none  Complications: none  Feliciana Rossetti, M.D. General, Bariatric, & Minimally Invasive Surgery [facility]

## 2022-12-04 NOTE — Consult Note (Addendum)
WOC Nurse Ostomy consult note POD 0  Consult received for new end ileostomy created emergently this am.  WOC nurse will see tomorrow, 12/05/22 for initial pouch change and stoma assessment.  It is noted that the patient lives at a facility, [facility].   WOC Nurse Wound consult Note: Reason for Consult:Chronic, healing wounds to buttocks and LEs. Patient is followed by the outpatient wound care center. Seen last by Dr, Mikey Bussing on 11/30/22. I will provide guidance for topical care that is consistent with the goals of care in place by Dr. Mikey Bussing. Wound type: Pressure, venous insufficiency/lymphedema, trauma (knee), neuropathic/diabetic Pressure Injury POA: Yes  Measurement: Last week's measurements from Dr. Mikey Bussing on 11/30/22: Left knee: 1.2cm x 1.8cm x 0.4cm with red and yellow wound bed, small serous exudate Left lateral 5th digit: 5.4cm x 2.5cm x 0.1cm red, small nonviable. Small to moderate serous exudate Left dorsal foot: 0.5cm x 2.1cm x 0.4cm: red and yellow wound bed, serous moderate exudate Left lateral upper LE: 2.2cm x 0.5cm x 0.1cm with red and yellow wound bed, small serous exudate Left gluteus Stage 3 PI: 1.3cm x 1.1cm x 0.1cm, red granulation tissue, small serous exudate Wound bed: As described above Drainage (amount, consistency, odor) As described above Periwound: intact Dressing procedure/placement/frequency: Wounds are to be cleansed daily with soap and water, rinsed with NS, and patted dry. They are to be covered with thin layer of leptospermum Manuka honey (Medihoney), topped with dry gauze and secured with silicone foam. Heels are to be floated via Prevalon pressure redistribution heel boots. Patient is to return to the care and oversight of Dr,. Hoffman at the outpatient wound care center post discharge.   WOC nursing team will follow for ostomy care and teaching, and will remain available to this patient, the nursing and medical teams.    Thank you for inviting [place]  to participate in this patient's Plan of Care.  Ladona Mow, MSN, RN, CNS, GNP, Leda Min, [employer], [employer] phone:  [phone]

## 2022-12-04 NOTE — Anesthesia Procedure Notes (Signed)
Central Venous Catheter Insertion Performed by: [name], MD, anesthesiologist Start/End6/18/2024 1:03 AM, 12/04/2022 1:20 AM Patient location: Pre-op. Preanesthetic checklist: patient identified, IV checked, site marked, risks and benefits discussed, surgical consent, monitors and equipment checked, pre-op evaluation, timeout performed and anesthesia consent Position: supine Lidocaine 1% used for infiltration and patient sedated Hand hygiene performed  and maximum sterile barriers used  Catheter size: 8 Fr Total catheter length 16. Central line was placed.Double lumen Procedure performed using ultrasound guided technique. Ultrasound Notes:anatomy identified, needle tip was noted to be adjacent to the nerve/plexus identified, no ultrasound evidence of intravascular and/or intraneural injection and image(s) printed for medical record Attempts: 1 Following insertion, dressing applied, line sutured and Biopatch. Post procedure assessment: blood return through all ports, no air and free fluid flow  Patient tolerated the procedure well with no immediate complications.

## 2022-12-04 NOTE — TOC Progression Note (Addendum)
Transition of Care [facility]) - Initial/Assessment Note    Patient Details  Name: Gregory [name] MRN: [id number] Date of Birth: 07-12-1960  Transition of Care [facility]) CM/SW Contact:    Ralene Bathe, LCSW Phone Number: 12/04/2022, 9:06 AM  Clinical Narrative:                 Addendum 10:00- LCSW received a returned call from [facility].  The patient was long term at the facility and can return when medically ready.  LCSW contacted [place] in admissions at [facility] to verify that the patient is a long term resident at the facility and is awaiting a returned call.   TOC following.        Patient Goals and CMS Choice            Expected Discharge Plan and Services                                              Prior Living Arrangements/Services                       Activities of Daily Living      Permission Sought/Granted                  Emotional Assessment              Admission diagnosis:  Sepsis [facility]) [A41.9] Patient Active Problem List   Diagnosis Date Noted   Occipital stroke (HCC) 11/27/2022   Skin ulcer of left ankle, limited to breakdown of skin (HCC) 09/19/2022   Stroke-like symptom 09/18/2022   Diabetic ulcer of lower leg (HCC) 09/18/2022   Constipation 09/18/2022   Shortness of breath 09/07/2022   Symptomatic anemia 09/05/2022   Chronic hypoxic respiratory failure (HCC) 09/05/2022   Respiratory failure (HCC) 08/22/2022   Pressure injury of skin 08/22/2022   Influenza A 08/22/2022   Sepsis (HCC) 08/22/2022   Pneumonia 05/01/2022   Right upper lobe pneumonia 09/05/2021   Hyponatremia 09/05/2021   Anxiety 09/05/2021   BPH (benign prostatic hyperplasia) 09/05/2021   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   Asymptomatic bacteriuria 06/30/2021   Normocytic anemia 06/29/2021   Pain of amputation stump of right lower extremity (HCC) 03/15/2021   Melanoma of cheek (HCC) 10/15/2019   Acute hypoxic respiratory failure  (HCC) 07/11/2019   Acute respiratory disease due to COVID-19 virus 07/11/2019   Wound infection 06/22/2019   Abscess 06/22/2019   CKD (chronic kidney disease), stage III (HCC) 05/23/2019   Tobacco abuse 05/23/2019   Cellulitis 05/23/2019   S/P AKA (above knee amputation) unilateral, right (HCC) 12/05/2018   Amputation stump infection (HCC)    Lymphedema of left leg    Diabetic polyneuropathy associated with type 2 diabetes mellitus (HCC)    Above knee amputation of right lower extremity (HCC) 10/22/2018   Dehiscence of amputation stump (HCC)    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   Candidiasis 08/12/2018   Necrosis of toe (HCC) 07/08/2018   Amputated toe of left foot (HCC) 06/18/2018   Streptococcal infection    Pressure injury, stage 3 (HCC) 06/06/2018   Cutaneous abscess of left foot    Moderate protein-calorie malnutrition (HCC)    Cellulitis of left lower extremity    Septic arthritis (HCC) 06/03/2018   Idiopathic chronic venous hypertension of left lower extremity  with ulcer and inflammation (HCC) 11/14/2017   Great toe amputation status, left 11/14/2017   Enlarged lymph node in neck 07/18/2017   Malignant melanoma of face (HCC) 04/16/2017   Osteomyelitis (HCC) 03/25/2017   Skin cancer 03/25/2017   Depression 11/05/2016   Diabetic ulcer of toe of left foot (HCC) 10/02/2016   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   Chronic pain 09/27/2016   DM2 (diabetes mellitus, type 2) (HCC) 06/06/2016   GERD without esophagitis 05/11/2016   Hypertensive heart disease with congestive heart failure (HCC) 12/15/2015   Spinal stenosis in cervical region 12/15/2015   Type II diabetes mellitus with neurological manifestations (HCC) 12/15/2015   Chronic diastolic HF (heart failure) (HCC)    Pulmonary hypertension (HCC)    HTN (hypertension)    HLD (hyperlipidemia)    Urinary retention    Diskitis    Foot drop, bilateral    Sepsis with acute renal failure without septic shock  (HCC)    COPD (chronic obstructive pulmonary disease) (HCC) 10/03/2015   Acute osteomyelitis of left foot (HCC) 10/03/2015   MRSA bacteremia 08/13/2015   Chronic low back pain 08/13/2015   Chronic multifocal osteomyelitis, multiple sites (HCC) 08/13/2015   Community acquired pneumonia 08/13/2015   PCP:  Sherron Monday, MD Pharmacy:   [facility] - [place], [place] - [address] [place] [address] Phone: [phone] Fax: [phone]     Social Determinants of Health (SDOH) Social History: SDOH Screenings   Food Insecurity: No Food Insecurity (09/18/2022)  Housing: Low Risk  (09/18/2022)  Transportation Needs: No Transportation Needs (09/18/2022)  Utilities: Not At Risk (09/18/2022)  Depression (PHQ2-9): Low Risk  (03/08/2021)  Tobacco Use: High Risk (11/27/2022)   SDOH Interventions:     Readmission Risk Interventions    09/06/2022    1:48 PM 08/27/2022   12:46 PM 05/03/2022   11:53 AM  Readmission Risk Prevention Plan  Transportation Screening Complete Complete Complete  Medication Review Oceanographer) Complete Complete Complete  PCP or Specialist appointment within 3-5 days of discharge Complete Complete Complete  HRI or Home Care Consult Complete Complete Complete  SW Recovery Care/Counseling Consult Complete Complete Complete  Palliative Care Screening Not Applicable Not Applicable Not Applicable  Skilled Nursing Facility Complete Complete Complete

## 2022-12-04 NOTE — Anesthesia Preprocedure Evaluation (Addendum)
Anesthesia Evaluation  Patient identified by MRN, date of birth, ID band Patient awake    Reviewed: Allergy & Precautions, NPO status , Patient's Chart, lab work & pertinent test results, reviewed documented beta blocker date and time   Airway Mallampati: III  TM Distance: >3 FB Neck ROM: Limited    Dental  (+) Edentulous Upper, Edentulous Lower, Dental Advisory Given   Pulmonary pneumonia, COPD, Current Smoker and Patient abstained from smoking.   Pulmonary exam normal breath sounds clear to auscultation       Cardiovascular hypertension, Pt. on home beta blockers and Pt. on medications +CHF   Rhythm:Regular Rate:Normal  Echo 09/2022  1. Left ventricular ejection fraction, by estimation, is 55 to 60%. The left ventricle has normal function. The left ventricle has no regional wall motion abnormalities. The left ventricular internal cavity size was mildly dilated. Left ventricular diastolic parameters are consistent with Grade I diastolic dysfunction (impaired relaxation).   2. Right ventricular systolic function is normal. The right ventricular size is normal. Tricuspid regurgitation signal is inadequate for assessing PA pressure.   3. Left atrial size was mildly dilated.   4. The mitral valve is normal in structure. No evidence of mitral valve regurgitation.   5. The aortic valve is tricuspid. There is mild calcification of the aortic valve. Aortic valve regurgitation is mild to moderate. Aortic valve sclerosis/calcification is present, without any evidence of aortic stenosis.   6. The inferior vena cava is normal in size with greater than 50% respiratory variability, suggesting right atrial pressure of 3 mmHg.     Echo 11/20/15: Study Conclusions - Left ventricle: The cavity size was normal. There was mild concentric hypertrophy. Systolic function was normal. Theestimated ejection fraction was in the range of 55% to 60%. Doppler  parameters are consistent with abnormal left ventricular relaxation (grade 1 diastolic dysfunction). - Pericardium, extracardiac: A small pericardial effusion was identified anterior to the heart. There was no evidence of hemodynamic compromise.  Carotid US 04/11/12: Summary: - No significant extracranial carotid artery stenosis demonstrated. Right vertebral could not be visualized due to thickness of the neck and respiratory interference. Left vertebral artery flow is antegrade - Technically difficult due to thickness of the neck, high bifurcations, and respiratory interference.   Neuro/Psych  PSYCHIATRIC DISORDERS Anxiety Depression     Neuromuscular disease    GI/Hepatic Neg liver ROS,GERD  ,,  Endo/Other  diabetes, Type 2    Renal/GU Renal InsufficiencyRenal disease     Musculoskeletal  (+) Arthritis ,    Abdominal  (+) + obese  Peds  Hematology  (+) Blood dyscrasia (Hgb 7.7), anemia   Anesthesia Other Findings R AKA stump infection  Reproductive/Obstetrics                             Anesthesia Physical Anesthesia Plan  ASA: 4 and emergent  Anesthesia Plan: General   Post-op Pain Management: Minimal or no pain anticipated   Induction: Intravenous, Rapid sequence and Cricoid pressure planned  PONV Risk Score and Plan: 2 and Ondansetron, Dexamethasone and Treatment may vary due to age or medical condition  Airway Management Planned: Oral ETT and Video Laryngoscope Planned  Additional Equipment: Arterial line  Intra-op Plan:   Post-operative Plan: Post-operative intubation/ventilation  Informed Consent: I have reviewed the patients History and Physical, chart, labs and discussed the procedure including the risks, benefits and alternatives for the proposed anesthesia with the patient or authorized representative who  has indicated his/her understanding and acceptance.     Dental advisory given  Plan Discussed with:  CRNA  Anesthesia Plan Comments:         Anesthesia Quick Evaluation

## 2022-12-04 NOTE — ED Notes (Signed)
Report given to [employer], CRNA.Patient is ready for transport.

## 2022-12-04 NOTE — Anesthesia Postprocedure Evaluation (Signed)
Anesthesia Post Note  Patient: Gregory [name]  Procedure(s) Performed: EXPLORATORY LAPAROTOMY  total abdominal colectomy with end ileostomy     Patient location during evaluation: ICU Anesthesia Type: General Level of consciousness: sedated and patient remains intubated per anesthesia plan Pain management: pain level controlled Vital Signs Assessment: post-procedure vital signs reviewed and stable Respiratory status: patient on ventilator - see flowsheet for VS and patient remains intubated per anesthesia plan Cardiovascular status: unstable Anesthetic complications: no   No notable events documented.  Last Vitals:  Vitals:   12/04/22 0700 12/04/22 0714  BP:    Pulse: 75   Resp: (!) 26   Temp: 37.3 C 37.4 C  SpO2: 99%     Last Pain:  Vitals:   12/04/22 0714  TempSrc: Bladder  PainSc:                  [name]

## 2022-12-05 ENCOUNTER — Inpatient Hospital Stay (HOSPITAL_COMMUNITY): Payer: Medicaid Other

## 2022-12-05 DIAGNOSIS — R6521 Severe sepsis with septic shock: Secondary | ICD-10-CM

## 2022-12-05 DIAGNOSIS — A419 Sepsis, unspecified organism: Secondary | ICD-10-CM | POA: Diagnosis not present

## 2022-12-05 DIAGNOSIS — J9601 Acute respiratory failure with hypoxia: Secondary | ICD-10-CM | POA: Diagnosis not present

## 2022-12-05 LAB — BASIC METABOLIC PANEL
Anion gap: 19 — ABNORMAL HIGH (ref 5–15)
Anion gap: 14 (ref 5–15)
Anion gap: 16 — ABNORMAL HIGH (ref 5–15)
BUN: 54 mg/dL — ABNORMAL HIGH (ref 8–23)
BUN: 53 mg/dL — ABNORMAL HIGH (ref 8–23)
BUN: 58 mg/dL — ABNORMAL HIGH (ref 8–23)
CO2: 24 mmol/L (ref 22–32)
CO2: 30 mmol/L (ref 22–32)
CO2: 30 mmol/L (ref 22–32)
Calcium: 7.4 mg/dL — ABNORMAL LOW (ref 8.9–10.3)
Calcium: 7.1 mg/dL — ABNORMAL LOW (ref 8.9–10.3)
Calcium: 7.2 mg/dL — ABNORMAL LOW (ref 8.9–10.3)
Chloride: 91 mmol/L — ABNORMAL LOW (ref 98–111)
Chloride: 91 mmol/L — ABNORMAL LOW (ref 98–111)
Chloride: 90 mmol/L — ABNORMAL LOW (ref 98–111)
Creatinine, Ser: 3.27 mg/dL — ABNORMAL HIGH (ref 0.61–1.24)
Creatinine, Ser: 3.5 mg/dL — ABNORMAL HIGH (ref 0.61–1.24)
Creatinine, Ser: 3.49 mg/dL — ABNORMAL HIGH (ref 0.61–1.24)
GFR, Estimated: 20 mL/min — ABNORMAL LOW (ref >60)
GFR, Estimated: 19 mL/min — ABNORMAL LOW (ref >60)
GFR, Estimated: 19 mL/min — ABNORMAL LOW (ref >60)
Glucose, Bld: 200 mg/dL — ABNORMAL HIGH (ref 70–99)
Glucose, Bld: 129 mg/dL — ABNORMAL HIGH (ref 70–99)
Glucose, Bld: 113 mg/dL — ABNORMAL HIGH (ref 70–99)
Potassium: 5.2 mmol/L — ABNORMAL HIGH (ref 3.5–5.1)
Potassium: 4.2 mmol/L (ref 3.5–5.1)
Potassium: 4.1 mmol/L (ref 3.5–5.1)
Sodium: 134 mmol/L — ABNORMAL LOW (ref 135–145)
Sodium: 135 mmol/L (ref 135–145)
Sodium: 136 mmol/L (ref 135–145)

## 2022-12-05 LAB — MAGNESIUM
Magnesium: 2.9 mg/dL — ABNORMAL HIGH (ref 1.7–2.4)
Magnesium: 2.9 mg/dL — ABNORMAL HIGH (ref 1.7–2.4)
Magnesium: 2.8 mg/dL — ABNORMAL HIGH (ref 1.7–2.4)

## 2022-12-05 LAB — URINE CULTURE: Culture: >=100000 — AB

## 2022-12-05 LAB — CBC
HCT: 34.9 % — ABNORMAL LOW (ref 39.0–52.0)
Hemoglobin: 11 g/dL — ABNORMAL LOW (ref 13.0–17.0)
MCH: 27.2 pg (ref 26.0–34.0)
MCHC: 31.5 g/dL (ref 30.0–36.0)
MCV: 86.2 fL (ref 80.0–100.0)
Platelets: 211 10*3/uL (ref 150–400)
RBC: 4.05 MIL/uL — ABNORMAL LOW (ref 4.22–5.81)
RDW: 15.9 % — ABNORMAL HIGH (ref 11.5–15.5)
WBC: 9.9 10*3/uL (ref 4.0–10.5)
nRBC: 0 % (ref 0.0–0.2)

## 2022-12-05 LAB — POCT I-STAT 7, (LYTES, BLD GAS, ICA,H+H)
Acid-Base Excess: 8 mmol/L — ABNORMAL HIGH (ref 0.0–2.0)
Bicarbonate: 33.6 mmol/L — ABNORMAL HIGH (ref 20.0–28.0)
Calcium, Ion: 0.91 mmol/L — ABNORMAL LOW (ref 1.15–1.40)
HCT: 30 % — ABNORMAL LOW (ref 39.0–52.0)
Hemoglobin: 10.2 g/dL — ABNORMAL LOW (ref 13.0–17.0)
O2 Saturation: 96 %
Patient temperature: 38.5
Potassium: 4.2 mmol/L (ref 3.5–5.1)
Sodium: 135 mmol/L (ref 135–145)
TCO2: 35 mmol/L — ABNORMAL HIGH (ref 22–32)
pCO2 arterial: 51.6 mmHg — ABNORMAL HIGH (ref 32–48)
pH, Arterial: 7.427 (ref 7.35–7.45)
pO2, Arterial: 88 mmHg (ref 83–108)

## 2022-12-05 LAB — BPAM RBC
Unit Type and Rh: 5100
Unit Type and Rh: 5100

## 2022-12-05 LAB — TYPE AND SCREEN: Antibody Screen: NEGATIVE

## 2022-12-05 LAB — GLUCOSE, CAPILLARY
Glucose-Capillary: 201 mg/dL — ABNORMAL HIGH (ref 70–99)
Glucose-Capillary: 162 mg/dL — ABNORMAL HIGH (ref 70–99)
Glucose-Capillary: 121 mg/dL — ABNORMAL HIGH (ref 70–99)
Glucose-Capillary: 111 mg/dL — ABNORMAL HIGH (ref 70–99)
Glucose-Capillary: 78 mg/dL (ref 70–99)
Glucose-Capillary: 93 mg/dL (ref 70–99)

## 2022-12-05 LAB — CULTURE, BLOOD (ROUTINE X 2)
Culture: NO GROWTH
Special Requests: ADEQUATE

## 2022-12-05 LAB — PHOSPHORUS: Phosphorus: 6.6 mg/dL — ABNORMAL HIGH (ref 2.5–4.6)

## 2022-12-05 LAB — TRIGLYCERIDES: Triglycerides: 314 mg/dL — ABNORMAL HIGH (ref <150)

## 2022-12-05 MED ORDER — ETOMIDATE 2 MG/ML IV SOLN: 20.0000 mg | Freq: Once | INTRAVENOUS | Status: AC

## 2022-12-05 MED ORDER — DEXMEDETOMIDINE HCL IN NACL 400 MCG/100ML IV SOLN
0.0000 ug/kg/h | INTRAVENOUS | Status: DC
  Administered 2022-12-05: 0.4 ug/kg/h via INTRAVENOUS
  Administered 2022-12-05: 0.7 ug/kg/h via INTRAVENOUS
  Administered 2022-12-06 (×3): 1 ug/kg/h via INTRAVENOUS
  Filled 2022-12-05 (×5): qty 100

## 2022-12-05 MED ORDER — VITAL 1.5 CAL PO LIQD
1000.0000 mL | ORAL | Status: DC
  Administered 2022-12-05: 1000 mL
  Filled 2022-12-05: qty 1000

## 2022-12-05 MED ORDER — MIDAZOLAM HCL 2 MG/2ML IJ SOLN
INTRAMUSCULAR | Status: AC
  Administered 2022-12-05: 2 mg via INTRAVENOUS
  Filled 2022-12-05: qty 2

## 2022-12-05 MED ORDER — SUCCINYLCHOLINE CHLORIDE 200 MG/10ML IV SOSY
PREFILLED_SYRINGE | INTRAVENOUS | Status: AC
  Filled 2022-12-05: qty 10

## 2022-12-05 MED ORDER — FENTANYL CITRATE PF 50 MCG/ML IJ SOSY: 100.0000 ug | PREFILLED_SYRINGE | Freq: Once | INTRAMUSCULAR | Status: AC

## 2022-12-05 MED ORDER — AMIODARONE HCL IN DEXTROSE 360-4.14 MG/200ML-% IV SOLN
60.0000 mg/h | INTRAVENOUS | Status: AC
  Administered 2022-12-05 (×2): 60 mg/h via INTRAVENOUS
  Filled 2022-12-05: qty 200

## 2022-12-05 MED ORDER — ORAL CARE MOUTH RINSE: 15.0000 mL | OROMUCOSAL | Status: DC | PRN

## 2022-12-05 MED ORDER — ONDANSETRON HCL 4 MG/2ML IJ SOLN
INTRAMUSCULAR | Status: AC
  Filled 2022-12-05: qty 2

## 2022-12-05 MED ORDER — ROCURONIUM BROMIDE 10 MG/ML (PF) SYRINGE
PREFILLED_SYRINGE | INTRAVENOUS | Status: AC
  Filled 2022-12-05: qty 10

## 2022-12-05 MED ORDER — SODIUM CHLORIDE 0.9 % IV SOLN
1.0000 g | Freq: Two times a day (BID) | INTRAVENOUS | Status: AC
  Administered 2022-12-05 – 2022-12-09 (×9): 1 g via INTRAVENOUS
  Filled 2022-12-05 (×9): qty 20

## 2022-12-05 MED ORDER — VITAL HIGH PROTEIN PO LIQD
1000.0000 mL | ORAL | Status: DC
Start: 2022-12-05 — End: 2022-12-05

## 2022-12-05 MED ORDER — MIDAZOLAM HCL 2 MG/2ML IJ SOLN: 2.0000 mg | Freq: Once | INTRAMUSCULAR | Status: AC

## 2022-12-05 MED ORDER — ACETAMINOPHEN 10 MG/ML IV SOLN
1000.0000 mg | Freq: Once | INTRAVENOUS | Status: AC
  Administered 2022-12-05: 1000 mg via INTRAVENOUS
  Filled 2022-12-05: qty 100

## 2022-12-05 MED ORDER — OXYCODONE HCL 5 MG PO TABS
20.0000 mg | ORAL_TABLET | Freq: Three times a day (TID) | ORAL | Status: DC
  Administered 2022-12-06 – 2022-12-08 (×7): 20 mg
  Filled 2022-12-05 (×7): qty 4

## 2022-12-05 MED ORDER — ONDANSETRON HCL 4 MG/2ML IJ SOLN
4.0000 mg | Freq: Four times a day (QID) | INTRAMUSCULAR | Status: DC | PRN
  Administered 2022-12-05: 4 mg via INTRAVENOUS

## 2022-12-05 MED ORDER — INSULIN GLARGINE-YFGN 100 UNIT/ML ~~LOC~~ SOLN
12.0000 [IU] | Freq: Every day | SUBCUTANEOUS | Status: DC
  Administered 2022-12-05: 12 [IU] via SUBCUTANEOUS
  Filled 2022-12-05 (×2): qty 0.12

## 2022-12-05 MED ORDER — REVEFENACIN 175 MCG/3ML IN SOLN
175.0000 ug | Freq: Every day | RESPIRATORY_TRACT | Status: DC
  Administered 2022-12-06 – 2022-12-13 (×8): 175 ug via RESPIRATORY_TRACT
  Filled 2022-12-05 (×17): qty 3

## 2022-12-05 MED ORDER — OXYCODONE HCL 5 MG PO TABS
20.0000 mg | ORAL_TABLET | Freq: Three times a day (TID) | ORAL | Status: DC
  Administered 2022-12-05: 20 mg via ORAL
  Filled 2022-12-05: qty 4

## 2022-12-05 MED ORDER — AMIODARONE HCL IN DEXTROSE 360-4.14 MG/200ML-% IV SOLN
30.0000 mg/h | INTRAVENOUS | Status: DC
  Administered 2022-12-06: 30 mg/h via INTRAVENOUS
  Filled 2022-12-05 (×2): qty 200

## 2022-12-05 MED ORDER — ARFORMOTEROL TARTRATE 15 MCG/2ML IN NEBU
15.0000 ug | INHALATION_SOLUTION | Freq: Two times a day (BID) | RESPIRATORY_TRACT | Status: DC
  Administered 2022-12-05 – 2022-12-23 (×20): 15 ug via RESPIRATORY_TRACT
  Filled 2022-12-05 (×32): qty 2

## 2022-12-05 MED ORDER — METOPROLOL TARTRATE 12.5 MG HALF TABLET: 12.5000 mg | ORAL_TABLET | Freq: Two times a day (BID) | ORAL | Status: DC

## 2022-12-05 MED ORDER — PHENYLEPHRINE HCL-NACL 20-0.9 MG/250ML-% IV SOLN
0.0000 ug/min | INTRAVENOUS | Status: DC
  Administered 2022-12-05: 20 ug/min via INTRAVENOUS
  Administered 2022-12-05: 90 ug/min via INTRAVENOUS
  Administered 2022-12-06: 180 ug/min via INTRAVENOUS
  Administered 2022-12-06: 150 ug/min via INTRAVENOUS
  Filled 2022-12-05 (×3): qty 250
  Filled 2022-12-05: qty 500
  Filled 2022-12-05: qty 250

## 2022-12-05 MED ORDER — ACETAMINOPHEN 160 MG/5ML PO SOLN
650.0000 mg | ORAL | Status: DC | PRN
  Administered 2022-12-06: 650 mg
  Filled 2022-12-05: qty 20.3

## 2022-12-05 MED ORDER — ORAL CARE MOUTH RINSE
15.0000 mL | OROMUCOSAL | Status: DC
  Administered 2022-12-06 – 2022-12-13 (×89): 15 mL via OROMUCOSAL

## 2022-12-05 MED ORDER — FUROSEMIDE 10 MG/ML IJ SOLN
20.0000 mg | Freq: Once | INTRAMUSCULAR | Status: AC
  Administered 2022-12-05: 20 mg via INTRAVENOUS
  Filled 2022-12-05: qty 2

## 2022-12-05 MED ORDER — FENTANYL CITRATE PF 50 MCG/ML IJ SOSY
PREFILLED_SYRINGE | INTRAMUSCULAR | Status: AC
  Administered 2022-12-05: 100 ug via INTRAVENOUS
  Filled 2022-12-05: qty 2

## 2022-12-05 MED ORDER — ETOMIDATE 2 MG/ML IV SOLN
INTRAVENOUS | Status: AC
  Administered 2022-12-05: 20 mg via INTRAVENOUS
  Filled 2022-12-05: qty 20

## 2022-12-05 MED ORDER — VITAL 1.5 CAL PO LIQD: 1000.0000 mL | ORAL | Status: DC

## 2022-12-05 MED ORDER — BUDESONIDE 0.5 MG/2ML IN SUSP
0.5000 mg | Freq: Two times a day (BID) | RESPIRATORY_TRACT | Status: DC
  Administered 2022-12-05 – 2022-12-23 (×20): 0.5 mg via RESPIRATORY_TRACT
  Filled 2022-12-05 (×33): qty 2

## 2022-12-05 MED ORDER — FENTANYL CITRATE PF 50 MCG/ML IJ SOSY
25.0000 ug | PREFILLED_SYRINGE | INTRAMUSCULAR | Status: DC | PRN
  Administered 2022-12-05 (×4): 50 ug via INTRAVENOUS
  Administered 2022-12-06: 100 ug via INTRAVENOUS
  Filled 2022-12-05: qty 1
  Filled 2022-12-05: qty 2
  Filled 2022-12-05 (×2): qty 1

## 2022-12-05 MED ORDER — FENTANYL CITRATE PF 50 MCG/ML IJ SOSY
25.0000 ug | PREFILLED_SYRINGE | INTRAMUSCULAR | Status: DC | PRN
  Administered 2022-12-05: 50 ug via INTRAVENOUS
  Filled 2022-12-05 (×2): qty 1

## 2022-12-05 NOTE — Consult Note (Signed)
WOC Nurse ostomy consult note Stoma type/location: RMQ ileostomy,  Chronic Venous insufficiency and lymphedema, left leg.  Right AKA.  Nonhealing wound to left plantar foot, near lateral aspect.  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 Stomal assessment/size: 1 3/4" edematous pink and moist  there is some green tinged feculent effluent in pouch Peristomal assessment: intact Treatment options for stomal/peristomal skin: barrier ring and 2 piece 2 1/4" high output pouch applied.  First tube feedings started today.  Output liquid, green Ostomy pouching: 2pc.high output with barrier ring  Education provided: Patient is sedated, opens eyes to voice.  Unable to participate in ostomy teaching today.  Enrolled patient in [employer] DC program: No WOC Nurse Consult Note: Reason for Consult:Chronic Venous insufficiency and lymphedema, left leg.  Right AKA.  Nonhealing wound to left plantar foot, near lateral aspect.  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 Wound type: Venous, neuropathic chronic Moisture associated skin damage to left posterior thigh and buttocks.  Will keep barrier cream to areas and keep skin clean and dry.  Pressure Injury POA: NA  Wound bed: Ruddy red to plantar foot Drainage (amount, consistency, odor) minimal serosanguinous Periwound: chronic skin changes, edema to left leg, generalized.  Dressing procedure/placement/frequency: Cleanse left plantar foot wound with NS and pat dry. Apply medihoney to wound bed.  Cover with gauze and kerlix. Change daily.     Will follow.  Mike Gip MSN, RN, FNP-BC CWON Wound, Ostomy, Continence Nurse Outpatient [facility] [phone] Pager [phone]

## 2022-12-05 NOTE — Progress Notes (Signed)
eLink Physician-Brief Progress Note Patient Name: Gregory [name] DOB: January 20, 1961 MRN: [id number]   Date of Service  12/05/2022  HPI/Events of Note  Patient had episodes of non-sustained wide-complex tachycardia followed by ectopy, EKG was unremarkable, patient no longer having ectopy,  Mg++ 2.9, K+ 5.2.  eICU Interventions  Continue to monitor patient without intervention at the moment.        [name] [name] 12/05/2022, 3:20 AM

## 2022-12-05 NOTE — Progress Notes (Signed)
eLink Physician-Brief Progress Note Patient Name: Gregory [name] DOB: August 26, 1960 MRN: [id number]   Date of Service  12/05/2022  HPI/Events of Note  Patient attempted to pull out his NG tube, he has sub-optimal pain control.  eICU Interventions  Bilateral soft wrist restraints ordered, IV Tylenol 1 gm x 1        Lovette Merta U Rmani Kellogg 12/05/2022, 8:35 PM

## 2022-12-05 NOTE — Progress Notes (Signed)
NAME:  Gregory [name], MRN:  [id number], DOB:  1961/03/16, LOS: 2 ADMISSION DATE:  12/03/2022, CONSULTATION DATE:  12/05/2022 REFERRING MD:  EDP, CHIEF COMPLAINT:  abdominal pain   History of Present Illness:  Gregory [name] is a 62 y.o. man with COPD on home oxygen and DM2 s/p RLE AKA who is a SNF resident at [facility] with frequent admissions to the hospital. Here with abdominal pain, and urinary incontinence. He reports excruciating abdominal pain that started earlier today, along with constipation recently.   Pertinent  Medical History  S/p RLE AKA DM2 COPD on home oxygen  Significant Hospital Events: Including procedures, antibiotic start and stop dates in addition to other pertinent events   6/17 admitted, s/p exploratory laparotomy for total colectomy due to ischemic colon 6/17 urine>> EColi>>  Interim History / Subjective:  Pressors off  Tol SBT  K rising slightly  Remains on fentanyl gtt  Objective   Blood pressure (!) 170/71, pulse (!) 110, temperature (!) 100.9 F (38.3 C), temperature source Bladder, resp. rate 20, weight 107.4 kg, SpO2 95 %.    Vent Mode: PRVC FiO2 (%):  [40 %] 40 % Set Rate:  [26 bmp] 26 bmp Vt Set:  [600 mL] 600 mL PEEP:  [5 cmH20] 5 cmH20 Plateau Pressure:  [17 cmH20-21 cmH20] 21 cmH20   Intake/Output Summary (Last 24 hours) at 12/05/2022 3220 Last data filed at 12/05/2022 0900 Gross per 24 hour  Intake 4663.5 ml  Output 3995 ml  Net 668.5 ml   Filed Weights   12/04/22 0435 12/05/22 0500  Weight: 106.6 kg 107.4 kg   Examination: General: chronically ill appearing male, intubated, NAD  HENT: moist mucous membranes, sclera anicteric Lungs: resps even non labored on PS 8/5, diminished bases  Cardiovascular: rrr, no murmurs Abdomen: mild tenderness, mildly distended, mid line incision c/d Extremities: RLE AKA, left foot open wound - amputated digits on left foot, wrapped Neuro: follows simple commands, gen weakness GU: foley in  place  Resolved Hospital Problem list     Assessment & Plan:  Septic Shock due to ischemic colitis s/p total colectomy 6/17 - MAP goal 65 or higher - off pressors this am  - Continue bicarb drip - continue Zosyn, vanc  - follow cultures  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 - General surgery following, watching high ostomy outpt  - dressing changes  - remain NPO - WOC following   Acute on chronic hypoxemic respiratory failure COPD Vent support - 8cc/kg  F/u CXR  F/u ABG PS wean  Hope can extubate today   AKI on CKD Stage 3B Anion Gap Metabolic Acidosis Lactic Acidosis Mild Hyponatremia Mild hyperkalemia  - Continue bicarb drip 131mL/hr - monitor Cr and UOP - consider renal input if worsening renal function or hyperkalemia   Anemia, Acute blood loss - trend CBC  Type 2 DM - glargine - increase to 12 units daily, continue SSI  - holding home meds   BPH - holding flomax, may need to resume during this admission  Neuropathy - holding home meds for now given AKI  PVD HTN HLD Holding metoprolol, statin, asa for now  Best Practice (right click and "Reselect all SmartList Selections" daily)   Diet/type: NPO DVT prophylaxis: prophylactic heparin  GI prophylaxis: PPI Lines: Central line, Arterial Line, and yes and it is still needed Foley:  Yes, and it is still needed Code Status:  full code Last date of multidisciplinary goals of care discussion [  pending ]  Labs   CBC: Recent Labs  Lab 12/03/22 1654 12/03/22 1715 12/04/22 0414 12/04/22 0424 12/04/22 0601 12/04/22 0941 12/05/22 0242  WBC 28.7*  --  7.4  --   --  8.9 9.9  NEUTROABS 25.6*  --   --   --   --   --   --   HGB 14.4   < > 10.5* 10.9* 11.6* 12.4* 11.0*  HCT 47.3   < > 34.4* 32.0* 34.0* 39.8 34.9*  MCV 90.8  --  89.6  --   --  87.7 86.2  PLT 500*  --  240  --   --  253 211   < > = values in this interval not displayed.    Basic Metabolic Panel: Recent Labs  Lab  12/03/22 1654 12/03/22 1715 12/04/22 0154 12/04/22 0414 12/04/22 0424 12/04/22 0601 12/04/22 0941 12/05/22 0242  NA 133* 133*   < > 134* 137 134* 133* 134*  K 4.8 4.7   < > 3.8 3.9 4.1 4.1 5.2*  CL 98 105  --  102  --   --  99 91*  CO2 15*  --   --  20*  --   --  21* 24  GLUCOSE 269* 277*  --  268*  --   --  287* 200*  BUN 51* 48*  --  50*  --   --  53* 54*  CREATININE 3.38* 3.30*  --  3.24*  --   --  3.44* 3.27*  CALCIUM 9.3  --   --  7.2*  --   --  7.7* 7.4*  MG  --   --   --  2.9*  --   --   --  2.9*  PHOS  --   --   --  5.2*  --   --   --  6.6*   < > = values in this interval not displayed.   GFR: Estimated Creatinine Clearance: 29.3 mL/min (A) (by C-G formula based on SCr of 3.27 mg/dL (H)). Recent Labs  Lab 12/03/22 1654 12/03/22 1920 12/03/22 2040 12/03/22 2240 12/04/22 0414 12/04/22 0941 12/05/22 0242  WBC 28.7*  --   --   --  7.4 8.9 9.9  LATICACIDVEN 3.1*   < > 6.1* 8.6* 3.2* 2.5*  --    < > = values in this interval not displayed.    Liver Function Tests: Recent Labs  Lab 12/03/22 1654 12/04/22 0941  AST 24 31  ALT 18 17  ALKPHOS 78 49  BILITOT 1.0 0.9  PROT 8.4* 5.0*  ALBUMIN 3.5 2.1*   Recent Labs  Lab 12/03/22 1654  LIPASE 21   No results for input(s): "AMMONIA" in the last 168 hours.  ABG    Component Value Date/Time   PHART 7.292 (L) 12/04/2022 0601   PCO2ART 38.9 12/04/2022 0601   PO2ART 105 12/04/2022 0601   HCO3 18.8 (L) 12/04/2022 0601   TCO2 20 (L) 12/04/2022 0601   ACIDBASEDEF 7.0 (H) 12/04/2022 0601   O2SAT 97 12/04/2022 0601     Coagulation Profile: Recent Labs  Lab 12/03/22 1654  INR 1.1    Cardiac Enzymes: No results for input(s): "CKTOTAL", "CKMB", "CKMBINDEX", "TROPONINI" in the last 168 hours.  HbA1C: Hemoglobin A1C  Date/Time Value Ref Range Status  08/14/2016 12:00 AM 5.6  Final  01/18/2016 12:00 AM 5.5  Final   Hgb A1c MFr Bld  Date/Time Value Ref Range Status  12/03/2022 04:54 PM 6.9 (  H) 4.8 - 5.6 %  Final    Comment:    (NOTE) Pre diabetes:          5.7%-6.4%  Diabetes:              >6.4%  Glycemic control for   <7.0% adults with diabetes   05/01/2022 08:13 AM 6.9 (H) 4.8 - 5.6 % Final    Comment:    (NOTE) Pre diabetes:          5.7%-6.4%  Diabetes:              >6.4%  Glycemic control for   <7.0% adults with diabetes     CBG: Recent Labs  Lab 12/04/22 1512 12/04/22 1933 12/04/22 2315 12/05/22 0317 12/05/22 0708  GLUCAP 211* 183* 187* 201* 162*     Critical care time: 35 min    The patient is critically ill due to lactic acidosis, sepsis.  Critical care was necessary to treat or prevent imminent or life-threatening deterioration.  Critical care was time spent personally by me on the following activities: development of treatment plan with patient and/or surrogate as well as nursing, discussions with consultants, evaluation of patient's response to treatment, examination of patient, obtaining history from patient or surrogate, ordering and performing treatments and interventions, ordering and review of laboratory studies, ordering and review of radiographic studies, pulse oximetry, re-evaluation of patient's condition and participation in multidisciplinary rounds.   Dirk Dress, NP Pulmonary/Critical Care Medicine  12/05/2022  9:37 AM

## 2022-12-05 NOTE — Progress Notes (Signed)
eLink Physician-Brief Progress Note Patient Name: Gregory [name] DOB: 1960-12-19 MRN: [id number]   Date of Service  12/05/2022  HPI/Events of Note  RR 28, Moderately tachypneic but not overtly distressed requiring immediate intubation.  eICU Interventions  Monitor closely with low threshold for intubation.        Thomasene Lot Flornce Record 12/05/2022, 7:52 PM

## 2022-12-05 NOTE — Progress Notes (Signed)
Pharmacy Antibiotic Note  Gregory [name] is a 62 y.o. male admitted on 12/03/2022 with severe abdominal pain with concerns for sepsis.   Urine culture now positive for ESBL E coli intermediate to Zosyn. Pharmacy has been consulted for meropenem and vancomycin dosing.   WBC 9.9, Tmax 101.1 SCr 3.27 (remains elevated)  Plan: Discontinue Zosyn  Start Meropenem 1g IV q12h  Continue Vancomycin 1250 mg IV every 48 hours (AUC 441, Vd 0.5, sCr 3.3)    > Goal AUC 400-550    > Check vancomycin levels at steady state  Monitor renal function for dose change of vancomycin Follow up WBC, fever curve, signs of clinical improvement, and de-escalation of antibiotics  Weight: 107.4 kg (236 lb 12.4 oz)  Temp (24hrs), Avg:100.7 F (38.2 C), Min:99.1 F (37.3 C), Max:101.1 F (38.4 C)  Recent Labs  Lab 12/03/22 1654 12/03/22 1715 12/03/22 1920 12/03/22 2040 12/03/22 2240 12/04/22 0414 12/04/22 0941 12/05/22 0242  WBC 28.7*  --   --   --   --  7.4 8.9 9.9  CREATININE 3.38* 3.30*  --   --   --  3.24* 3.44* 3.27*  LATICACIDVEN 3.1*  --  4.1* 6.1* 8.6* 3.2* 2.5*  --      Estimated Creatinine Clearance: 29.3 mL/min (A) (by C-G formula based on SCr of 3.27 mg/dL (H)).    Allergies  Allergen Reactions   Latex Other (See Comments)    "ALLERGIC," per MAR    Antimicrobials this admission: Zosyn 6/17 >> 6/19 Vancomycin 6/17 >>  Meropenem 6/19 >>   Microbiology results: 6/17 MRSA PCR: detected 6/17 BCx: NGTD x2 days 6/17 UCx: >100K ESBL E. coli (S: gentamicin, imipenem, nitrofurantoin; I: cefepime, Zosyn)   Thank you for allowing pharmacy to be a part of this patient's care.  Wilburn Cornelia, PharmD, BCPS Clinical Pharmacist 12/05/2022 12:06 PM   Please refer to [facility] for pharmacy phone number

## 2022-12-05 NOTE — Procedures (Signed)
Intubation Procedure Note  Gregory [name]  [id number]  29-Aug-1960  Date:12/05/22  Time:10:35 PM   Provider Performing:Susie Pousson Mechele Collin, MD   Procedure: Intubation (31500)  Indication(s) Respiratory Failure  Consent Risks of the procedure as well as the alternatives and risks of each were explained to the patient and/or caregiver.  Consent for the procedure was obtained and is signed in the bedside chart   Anesthesia Etomidate, Versed, and Fentanyl   Time Out Verified patient identification, verified procedure, site/side was marked, verified correct patient position, special equipment/implants available, medications/allergies/relevant history reviewed, required imaging and test results available.   Sterile Technique Usual hand hygeine, masks, and gloves were used   Procedure Description Patient positioned in bed supine.  Sedation given as noted above.  Patient was intubated with endotracheal tube using Glidescope.  View was Grade 1 full glottis .  Number of attempts was 1.  Colorimetric CO2 detector was consistent with tracheal placement.   Complications/Tolerance None; patient tolerated the procedure well. Chest X-ray is ordered to verify placement.   EBL Minimal   Specimen(s) None

## 2022-12-05 NOTE — Progress Notes (Signed)
eLink Physician-Brief Progress Note Patient Name: Gregory [name] DOB: [birth year] MRN: [id number]   Date of Service  12/05/2022  HPI/Events of Note  KUB reviewed.  eICU Interventions  Okay to use OG tube.        Thomasene Lot [name] 12/05/2022, 6:07 AM

## 2022-12-05 NOTE — Procedures (Signed)
Extubation Procedure Note  Patient Details:   Name: Gregory [name] DOB: March 15, 1961 MRN: [id number]   Airway Documentation:    Vent end date: 12/05/22 Vent end time: 1052   Evaluation  O2 sats: stable throughout Complications: No apparent complications Patient did tolerate procedure well. Bilateral Breath Sounds: Clear, Diminished   Yes  Pt extubated per physician order. Prior to, pt suctioned via ETT and orally, positive cuff leak. Upon extubation pt able to speak name, give a good cough and no stridor heard at this time. Pt placed on 6L nasal cannula. RT will continue to monitor and be available as needed.   [name] 12/05/2022, 11:00 AM

## 2022-12-05 NOTE — Progress Notes (Signed)
Nutrition Follow-up  DOCUMENTATION CODES:   Obesity unspecified  INTERVENTION:   Initiate trickle tube feeds via NG tube: - Vital 1.5 @ 20 ml/hr (480 ml/day)   RD will monitor for ability to advance tube feeding regimen to goal: - Vital 1.5 @ 65 ml/hr (1560 ml/day) - PROSource TF20 60 ml daily  Recommended tube feeding regimen at goal rate would provide 2420 kcal, 125 grams of protein, and 1192 ml of H2O.  NUTRITION DIAGNOSIS:   Increased nutrient needs related to wound healing, post-op healing as evidenced by estimated needs.  Ongoing, being addressed via initiation of trickle tube feeds  GOAL:   Patient will meet greater than or equal to 90% of their needs  Unmet at this time  MONITOR:   Diet advancement, Labs, Weight trends, TF tolerance, Skin, I & O's  REASON FOR ASSESSMENT:   Consult Enteral/tube feeding initiation and management (trickle tube feeds only, no advancement)  ASSESSMENT:   Pt with hx of DM type 2, COPD, HTN, HLD, CHF, GERD, hx CVA, and osteomyelitis s/p Right AKA presented to ED from SNF with severe abdominal pain. Workup in ED worrisome for bowel ischemia.  06/17 - admitted 06/18 - s/p exploratory laparotomy, total abdominal colectomy with end ileostomy, returned to ICU intubated 06/19 - extubated, trickle tube feeds started via NG tube  Discussed pt with RN and during ICU rounds. Pt extubated this morning. Pt was off pressors but now back on levophed @ 4 mcg/min. Pt remains off vasopressin. Consult received from Surgery to start trickle tube feeds via NG tube with no advancement at this time. Discussed plan with RN. NG tube remains in place after extubation and is secured with tape. NG tube in stomach per abdominal x-ray this morning.  Spoke with pt briefly at bedside. Pt appears sleepy and states that his stomach hurts. Noted liquid brown output in ileostomy. RN has not yet emptied it this morning.  Will order trickle tube feeds and leave  recommendations for goal regimen. Will monitor for diet advancement and/or ability to advance tube feeding regimen to goal.  Admit weight: 106.6 kg Current weight: 107.4 kg  Medications reviewed and include: SSI q 4 hours, semglee 12 units daily, IV protonix, IV abx, levophed @ 4 mcg/min, sodium bicarb in D5 gtt  Labs reviewed: sodium 134, potassium 5.2, chloride 91, BUN 54, creatinine 3.27, phosphorus 6.6, magnesium 2.9, TG 314 CBG's: 162-211 x 24 hours  UOP: 1910 ml x 24 hours NGT: 900 ml x 24 hours LUQ JP drain: 770 ml x 24 hours Ileostomy: 75 ml + 1 unmeasured occurrence x 24 hours I/O's: +5.3 L since admit  Diet Order:   Diet Order             Diet NPO time specified  Diet effective now                   EDUCATION NEEDS:   Not appropriate for education at this time  Skin:  Skin Assessment: Skin Integrity Issues: Stage II: R buttocks Stage III: L posterior thigh Incisions: abdomen Other: non-pressure wound (no location documented)  Last BM:  12/05/22 medium type 2 via rectum, ~75 ml x 24 hours via new end ileostomy  Height:   Ht Readings from Last 1 Encounters:  11/27/22 6' (1.829 m)    Weight:   Wt Readings from Last 1 Encounters:  12/05/22 107.4 kg    Ideal Body Weight:  72.8 kg (adjusted by 10% for hx of R AKA)  BMI:  Body mass index is 32.11 kg/m.  Estimated Nutritional Needs:   Kcal:  2300-2500 kcal/d  Protein:  120-140g/d  Fluid:  2.2L/d    Mertie Clause, MS, RD, LDN Inpatient Clinical Dietitian Please see AMiON for contact information.

## 2022-12-05 NOTE — Progress Notes (Signed)
PCCM Update:  Called to the bedside due to increased work of breathing and tachycardia. EKG shows atrial fibrillation with RVR.   Labs show normal electrolytes. Cr rising to 3.5.   Plan: - start heated high flow nasal canula - start amiodarone IV - give lasix 20mg  IV - add on Mg level - high risk for re-intubation, will watch closely  30 minutes CC time  Melody Comas, MD [facility] Office: [phone]   See Amion for personal pager PCCM on call pager [phone] until 7pm. Please call Elink 7p-7a. [phone]

## 2022-12-05 NOTE — Progress Notes (Signed)
2 Days Post-Op  Subjective: Arouses to pain.  Off all pressors this morning.  In restraints as he tried to pull his tube   Objective: Vital signs in last 24 hours: Temp:  [96.8 F (36 C)-101.1 F (38.4 C)] 100.9 F (38.3 C) (06/19 0830) Pulse Rate:  [76-182] 112 (06/19 0830) Resp:  [0-27] 20 (06/19 0830) SpO2:  [90 %-98 %] 92 % (06/19 0830) Arterial Line BP: (91-167)/(40-68) 121/57 (06/19 0830) FiO2 (%):  [40 %] 40 % (06/19 0715) Weight:  [107.4 kg] 107.4 kg (06/19 0500) Last BM Date : 12/05/22  Intake/Output from previous day: 06/18 0701 - 06/19 0700 In: 4184.2 [I.V.:3946; IV Piggyback:238.2] Out: 3655 [Urine:1910; Emesis/NG output:900; Drains:770; Stool:75] Intake/Output this shift: Total I/O In: 485.9 [I.V.:373.8; IV Piggyback:112.1] Out: 465 [Urine:400; Drains:65]  PE: Gen: appears critically ill Abd: obese, wound is clean and packed. JP drain with serous fluid (770cc yesterday), NGT in place with minimal to almost no output.  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  Lab Results:  Recent Labs    12/04/22 0941 12/05/22 0242  WBC 8.9 9.9  HGB 12.4* 11.0*  HCT 39.8 34.9*  PLT 253 211   BMET Recent Labs    12/04/22 0941 12/05/22 0242  NA 133* 134*  K 4.1 5.2*  CL 99 91*  CO2 21* 24  GLUCOSE 287* 200*  BUN 53* 54*  CREATININE 3.44* 3.27*  CALCIUM 7.7* 7.4*   PT/INR Recent Labs    12/03/22 1654  LABPROT 13.9  INR 1.1   CMP     Component Value Date/Time   NA 134 (L) 12/05/2022 0242   NA 138 10/22/2016 0000   K 5.2 (H) 12/05/2022 0242   CL 91 (L) 12/05/2022 0242   CO2 24 12/05/2022 0242   GLUCOSE 200 (H) 12/05/2022 0242   BUN 54 (H) 12/05/2022 0242   BUN 26 (A) 10/22/2016 0000   CREATININE 3.27 (H) 12/05/2022 0242   CREATININE 1.95 (H) 02/24/2020 0938   CREATININE 2.67 (H) 10/02/2016 1651   CALCIUM 7.4 (L) 12/05/2022 0242   PROT 5.0 (L) 12/04/2022 0941   ALBUMIN 2.1 (L) 12/04/2022 0941   AST 31 12/04/2022 0941   AST 8 (L) 02/24/2020  0938   ALT 17 12/04/2022 0941   ALT 8 02/24/2020 0938   ALKPHOS 49 12/04/2022 0941   BILITOT 0.9 12/04/2022 0941   BILITOT 0.3 02/24/2020 0938   GFRNONAA 20 (L) 12/05/2022 0242   GFRNONAA 36 (L) 02/24/2020 0938   GFRNONAA 25 (L) 10/02/2016 1651   GFRAA 42 (L) 02/24/2020 0938   GFRAA 29 (L) 10/02/2016 1651   Lipase     Component Value Date/Time   LIPASE 21 12/03/2022 1654       Studies/Results: DG Abd Portable 1V  Result Date: 12/05/2022 CLINICAL DATA:  OG tube placement EXAM: PORTABLE ABDOMEN - 1 VIEW COMPARISON:  KUB 1 day prior FINDINGS: The enteric catheter tip and sidehole project over the stomach. There is a nonobstructive bowel gas pattern in the imaged upper abdomen. There is no definite free intraperitoneal air, within the confines of supine technique. Ovoid radiodense objects in the left upper quadrant are unchanged. IMPRESSION: Enteric catheter tip and sidehole in the stomach. Electronically Signed   By: Lesia Hausen M.D.   On: 12/05/2022 08:57   DG Foot 2 Views Left  Result Date: 12/04/2022 CLINICAL DATA:  Soft tissue infection in left foot EXAM: LEFT FOOT - 2 VIEW COMPARISON:  09/18/2022 FINDINGS: There is previous amputation of  the big toe and second toe. There is previous removal of distal portions of first and second metatarsals. These findings have not changed. There is marked soft tissue swelling with interval worsening. There are no pockets of air in the soft tissues. Smooth marginated calcifications noted adjacent to the stump of first metatarsal have not changed. No focal lytic lesions are seen. IMPRESSION: Previous surgical removal of big toe and second toe along with the distal portions of first and second metatarsals. This finding has not changed. No recent fracture is seen. There are no focal lytic lesions. Electronically Signed   By: Ernie Avena M.D.   On: 12/04/2022 14:31   DG Abd Portable 1V  Result Date: 12/04/2022 CLINICAL DATA:  NG tube placement  EXAM: PORTABLE ABDOMEN - 1 VIEW COMPARISON:  CT done on 12/03/2022 FINDINGS: Tip and side port in NG tube are noted in the region of body of the stomach. There are multiple smooth marginated calcifications in left upper quadrant of abdomen which were shown to be between spleen and liver in the previous CT. Lower abdomen and pelvis are not included. IMPRESSION: Tip and side port in NG tube are noted within the body of the stomach. Electronically Signed   By: Ernie Avena M.D.   On: 12/04/2022 14:27   DG Chest Port 1 View  Result Date: 12/03/2022 CLINICAL DATA:  Possible sepsis EXAM: PORTABLE CHEST 1 VIEW COMPARISON:  09/18/2022 FINDINGS: Cardiac shadow is stable. Lungs are well aerated bilaterally. No focal infiltrate or effusion is seen. Postsurgical changes in the cervical spine are noted. IMPRESSION: No active disease. Electronically Signed   By: Alcide Clever M.D.   On: 12/03/2022 19:32   CT Angio Chest PE W and/or Wo Contrast  Result Date: 12/03/2022 CLINICAL DATA:  Hypoxia with shortness of breath and tachycardia Generalized abdominal pain and distension.  Sepsis. EXAM: CT ANGIOGRAPHY CHEST CT ABDOMEN AND PELVIS WITH CONTRAST TECHNIQUE: Multidetector CT imaging of the chest was performed using the standard protocol during bolus administration of intravenous contrast. Multiplanar CT image reconstructions and MIPs were obtained to evaluate the vascular anatomy. Multidetector CT imaging of the abdomen and pelvis was performed using the standard protocol during bolus administration of intravenous contrast. RADIATION DOSE REDUCTION: This exam was performed according to the departmental dose-optimization program which includes automated exposure control, adjustment of the mA and/or kV according to patient size and/or use of iterative reconstruction technique. CONTRAST:  75mL OMNIPAQUE IOHEXOL 350 MG/ML SOLN COMPARISON:  07/04/2021 chest CT FINDINGS: CTA CHEST FINDINGS Cardiovascular: Satisfactory  opacification of the pulmonary arteries to the segmental level. No evidence of pulmonary embolism. Normal heart size. No pericardial effusion. Atherosclerosis affecting the aorta and coronaries. Mediastinum/Nodes: Negative for mass or adenopathy Lungs/Pleura: Patchy haziness of the bilateral lungs, likely atelectasis. No consolidation, edema, effusion, or pneumothorax. There is some mucoid impaction within left lower lobe airways. Musculoskeletal: No acute or aggressive finding Review of the MIP images confirms the above findings. CT ABDOMEN and PELVIS FINDINGS Hepatobiliary: Subcentimeter low-density in the central liver, too small for accurate densitometry.Cholelithiasis. No evidence of biliary obstruction or inflammation. Pancreas: Unremarkable. Spleen: Unremarkable. Adrenals/Urinary Tract: Negative adrenals. No hydronephrosis or stone. Collapsed urinary bladder around a Foley catheter. Stomach/Bowel: Diffuse distension of the colon. Some mild pericholecystic fat edema is present distally. Fluid levels are seen proximally and desiccated stool distally. No volvulus or discrete obstructing lesion is seen. Despite distal desiccated stool, no convincing rectal impaction. Vascular/Lymphatic: No acute vascular abnormality. No mass or adenopathy. Reproductive:No pathologic  findings. Other: No ascites or pneumoperitoneum. Musculoskeletal: No acute abnormalities. Muscular atrophy atraumatically affecting the right hip musculature. Generalized lumbar spine degeneration. Review of the MIP images confirms the above findings. IMPRESSION: Chest CTA: 1. Negative for pulmonary embolism or other acute finding. 2. Atherosclerosis including the coronary arteries. Abdominal CT: 1. Diffuse colonic distension and tortuosity. There is desiccated stool distally but the overall pattern favors adynamic ileus over mechanical obstruction. 2. Cholelithiasis and atherosclerosis. Electronically Signed   By: Tiburcio Pea M.D.   On:  12/03/2022 19:12   CT ABDOMEN PELVIS W CONTRAST  Result Date: 12/03/2022 CLINICAL DATA:  Hypoxia with shortness of breath and tachycardia Generalized abdominal pain and distension.  Sepsis. EXAM: CT ANGIOGRAPHY CHEST CT ABDOMEN AND PELVIS WITH CONTRAST TECHNIQUE: Multidetector CT imaging of the chest was performed using the standard protocol during bolus administration of intravenous contrast. Multiplanar CT image reconstructions and MIPs were obtained to evaluate the vascular anatomy. Multidetector CT imaging of the abdomen and pelvis was performed using the standard protocol during bolus administration of intravenous contrast. RADIATION DOSE REDUCTION: This exam was performed according to the departmental dose-optimization program which includes automated exposure control, adjustment of the mA and/or kV according to patient size and/or use of iterative reconstruction technique. CONTRAST:  75mL OMNIPAQUE IOHEXOL 350 MG/ML SOLN COMPARISON:  07/04/2021 chest CT FINDINGS: CTA CHEST FINDINGS Cardiovascular: Satisfactory opacification of the pulmonary arteries to the segmental level. No evidence of pulmonary embolism. Normal heart size. No pericardial effusion. Atherosclerosis affecting the aorta and coronaries. Mediastinum/Nodes: Negative for mass or adenopathy Lungs/Pleura: Patchy haziness of the bilateral lungs, likely atelectasis. No consolidation, edema, effusion, or pneumothorax. There is some mucoid impaction within left lower lobe airways. Musculoskeletal: No acute or aggressive finding Review of the MIP images confirms the above findings. CT ABDOMEN and PELVIS FINDINGS Hepatobiliary: Subcentimeter low-density in the central liver, too small for accurate densitometry.Cholelithiasis. No evidence of biliary obstruction or inflammation. Pancreas: Unremarkable. Spleen: Unremarkable. Adrenals/Urinary Tract: Negative adrenals. No hydronephrosis or stone. Collapsed urinary bladder around a Foley catheter.  Stomach/Bowel: Diffuse distension of the colon. Some mild pericholecystic fat edema is present distally. Fluid levels are seen proximally and desiccated stool distally. No volvulus or discrete obstructing lesion is seen. Despite distal desiccated stool, no convincing rectal impaction. Vascular/Lymphatic: No acute vascular abnormality. No mass or adenopathy. Reproductive:No pathologic findings. Other: No ascites or pneumoperitoneum. Musculoskeletal: No acute abnormalities. Muscular atrophy atraumatically affecting the right hip musculature. Generalized lumbar spine degeneration. Review of the MIP images confirms the above findings. IMPRESSION: Chest CTA: 1. Negative for pulmonary embolism or other acute finding. 2. Atherosclerosis including the coronary arteries. Abdominal CT: 1. Diffuse colonic distension and tortuosity. There is desiccated stool distally but the overall pattern favors adynamic ileus over mechanical obstruction. 2. Cholelithiasis and atherosclerosis. Electronically Signed   By: Tiburcio Pea M.D.   On: 12/03/2022 19:12    Anti-infectives: Anti-infectives (From admission, onward)    Start     Dose/Rate Route Frequency Ordered Stop   12/05/22 1800  vancomycin (VANCOREADY) IVPB 1250 mg/250 mL        1,250 mg 166.7 mL/hr over 90 Minutes Intravenous Every 48 hours 12/03/22 2346     12/04/22 0100  piperacillin-tazobactam (ZOSYN) IVPB 3.375 g        3.375 g 12.5 mL/hr over 240 Minutes Intravenous Every 8 hours 12/03/22 2346     12/03/22 1700  vancomycin (VANCOREADY) IVPB 2000 mg/400 mL        2,000 mg 200 mL/hr over 120  Minutes Intravenous  Once 12/03/22 1657 12/03/22 2020   12/03/22 1700  piperacillin-tazobactam (ZOSYN) IVPB 3.375 g        3.375 g 100 mL/hr over 30 Minutes Intravenous  Once 12/03/22 1657 12/03/22 1804        Assessment/Plan POD 1, 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 Sheliah Hatch 12/04/22 -NS WD dressing changes to midline wound BID -cont  abx therapy, zosyn/vanc add -NGT to LIWS, await bowel function - pressors off today -cont JP drain, not surprised by large SS output, will start to slow down -WOC following for new ileostomy -hold any stool softeners for now.  Will likely end up need fiber/imodium with ileostomy eventually.  Will monitor this need.     FEN - NGT/IVFs VTE - heparin ID - vanc/zosyn  Septic shock - off pressors VDRF - per CCM MMP - per CCM   LOS: 2 days    Letha Cape , [facility] 12/05/2022, 9:03 AM Please see Amion for pager number during day hours 7:00am-4:30pm or 7:00am -11:30am on weekends

## 2022-12-06 ENCOUNTER — Inpatient Hospital Stay (HOSPITAL_COMMUNITY): Payer: Medicaid Other

## 2022-12-06 DIAGNOSIS — N179 Acute kidney failure, unspecified: Secondary | ICD-10-CM | POA: Diagnosis not present

## 2022-12-06 DIAGNOSIS — R6521 Severe sepsis with septic shock: Secondary | ICD-10-CM | POA: Diagnosis not present

## 2022-12-06 DIAGNOSIS — I420 Dilated cardiomyopathy: Secondary | ICD-10-CM | POA: Diagnosis not present

## 2022-12-06 DIAGNOSIS — A419 Sepsis, unspecified organism: Secondary | ICD-10-CM | POA: Diagnosis not present

## 2022-12-06 LAB — BASIC METABOLIC PANEL
Anion gap: 14 (ref 5–15)
Anion gap: 16 — ABNORMAL HIGH (ref 5–15)
Anion gap: 15 (ref 5–15)
BUN: 60 mg/dL — ABNORMAL HIGH (ref 8–23)
BUN: 63 mg/dL — ABNORMAL HIGH (ref 8–23)
BUN: 59 mg/dL — ABNORMAL HIGH (ref 8–23)
CO2: 29 mmol/L (ref 22–32)
CO2: 26 mmol/L (ref 22–32)
CO2: 28 mmol/L (ref 22–32)
Calcium: 7.1 mg/dL — ABNORMAL LOW (ref 8.9–10.3)
Calcium: 7.3 mg/dL — ABNORMAL LOW (ref 8.9–10.3)
Calcium: 7.5 mg/dL — ABNORMAL LOW (ref 8.9–10.3)
Chloride: 94 mmol/L — ABNORMAL LOW (ref 98–111)
Chloride: 95 mmol/L — ABNORMAL LOW (ref 98–111)
Chloride: 96 mmol/L — ABNORMAL LOW (ref 98–111)
Creatinine, Ser: 3.56 mg/dL — ABNORMAL HIGH (ref 0.61–1.24)
Creatinine, Ser: 3.71 mg/dL — ABNORMAL HIGH (ref 0.61–1.24)
Creatinine, Ser: 3.69 mg/dL — ABNORMAL HIGH (ref 0.61–1.24)
GFR, Estimated: 18 mL/min — ABNORMAL LOW (ref >60)
GFR, Estimated: 18 mL/min — ABNORMAL LOW (ref >60)
GFR, Estimated: 18 mL/min — ABNORMAL LOW (ref >60)
Glucose, Bld: 110 mg/dL — ABNORMAL HIGH (ref 70–99)
Glucose, Bld: 135 mg/dL — ABNORMAL HIGH (ref 70–99)
Glucose, Bld: 123 mg/dL — ABNORMAL HIGH (ref 70–99)
Potassium: 3.9 mmol/L (ref 3.5–5.1)
Potassium: 3.7 mmol/L (ref 3.5–5.1)
Potassium: 3.5 mmol/L (ref 3.5–5.1)
Sodium: 137 mmol/L (ref 135–145)
Sodium: 137 mmol/L (ref 135–145)
Sodium: 139 mmol/L (ref 135–145)

## 2022-12-06 LAB — POCT I-STAT 7, (LYTES, BLD GAS, ICA,H+H)
Acid-Base Excess: 8 mmol/L — ABNORMAL HIGH (ref 0.0–2.0)
Bicarbonate: 33.4 mmol/L — ABNORMAL HIGH (ref 20.0–28.0)
Calcium, Ion: 0.94 mmol/L — ABNORMAL LOW (ref 1.15–1.40)
HCT: 29 % — ABNORMAL LOW (ref 39.0–52.0)
Hemoglobin: 9.9 g/dL — ABNORMAL LOW (ref 13.0–17.0)
O2 Saturation: 95 %
Patient temperature: 38.1
Potassium: 4.1 mmol/L (ref 3.5–5.1)
Sodium: 135 mmol/L (ref 135–145)
TCO2: 35 mmol/L — ABNORMAL HIGH (ref 22–32)
pCO2 arterial: 51.9 mmHg — ABNORMAL HIGH (ref 32–48)
pH, Arterial: 7.421 (ref 7.35–7.45)
pO2, Arterial: 79 mmHg — ABNORMAL LOW (ref 83–108)

## 2022-12-06 LAB — HEPATIC FUNCTION PANEL
ALT: 21 U/L (ref 0–44)
AST: 43 U/L — ABNORMAL HIGH (ref 15–41)
Albumin: <1.5 g/dL — ABNORMAL LOW (ref 3.5–5.0)
Alkaline Phosphatase: 85 U/L (ref 38–126)
Bilirubin, Direct: 1.1 mg/dL — ABNORMAL HIGH (ref 0.0–0.2)
Indirect Bilirubin: 0.7 mg/dL (ref 0.3–0.9)
Total Bilirubin: 1.8 mg/dL — ABNORMAL HIGH (ref 0.3–1.2)
Total Protein: 5 g/dL — ABNORMAL LOW (ref 6.5–8.1)

## 2022-12-06 LAB — ECHOCARDIOGRAM LIMITED
Area-P 1/2: 4.06 cm2
S' Lateral: 3.7 cm
Weight: 3777.8 oz

## 2022-12-06 LAB — LACTIC ACID, PLASMA: Lactic Acid, Venous: 1.6 mmol/L (ref 0.5–1.9)

## 2022-12-06 LAB — GLUCOSE, CAPILLARY
Glucose-Capillary: 99 mg/dL (ref 70–99)
Glucose-Capillary: 104 mg/dL — ABNORMAL HIGH (ref 70–99)
Glucose-Capillary: 121 mg/dL — ABNORMAL HIGH (ref 70–99)
Glucose-Capillary: 134 mg/dL — ABNORMAL HIGH (ref 70–99)
Glucose-Capillary: 123 mg/dL — ABNORMAL HIGH (ref 70–99)

## 2022-12-06 LAB — CBC
HCT: 31.1 % — ABNORMAL LOW (ref 39.0–52.0)
Hemoglobin: 9.5 g/dL — ABNORMAL LOW (ref 13.0–17.0)
MCH: 27.1 pg (ref 26.0–34.0)
MCHC: 30.5 g/dL (ref 30.0–36.0)
MCV: 88.6 fL (ref 80.0–100.0)
Platelets: 168 10*3/uL (ref 150–400)
RBC: 3.51 MIL/uL — ABNORMAL LOW (ref 4.22–5.81)
RDW: 15.9 % — ABNORMAL HIGH (ref 11.5–15.5)
WBC: 7.8 10*3/uL (ref 4.0–10.5)
nRBC: 0 % (ref 0.0–0.2)

## 2022-12-06 LAB — COOXEMETRY PANEL
Carboxyhemoglobin: 2.5 % — ABNORMAL HIGH (ref 0.5–1.5)
Methemoglobin: 4 % — ABNORMAL HIGH (ref 0.0–1.5)
O2 Saturation: 58.8 %
Total hemoglobin: 10.3 g/dL — ABNORMAL LOW (ref 12.0–16.0)

## 2022-12-06 LAB — MAGNESIUM: Magnesium: 3 mg/dL — ABNORMAL HIGH (ref 1.7–2.4)

## 2022-12-06 LAB — CULTURE, BLOOD (ROUTINE X 2)

## 2022-12-06 LAB — SURGICAL PATHOLOGY

## 2022-12-06 LAB — PHOSPHORUS: Phosphorus: 8.2 mg/dL — ABNORMAL HIGH (ref 2.5–4.6)

## 2022-12-06 LAB — STREP PNEUMONIAE URINARY ANTIGEN: Strep Pneumo Urinary Antigen: NEGATIVE

## 2022-12-06 MED ORDER — FUROSEMIDE 10 MG/ML IJ SOLN
40.0000 mg | Freq: Once | INTRAMUSCULAR | Status: AC
  Administered 2022-12-06: 40 mg via INTRAVENOUS
  Filled 2022-12-06: qty 4

## 2022-12-06 MED ORDER — ALBUMIN HUMAN 5 % IV SOLN
25.0000 g | Freq: Once | INTRAVENOUS | Status: AC
  Administered 2022-12-06: 25 g via INTRAVENOUS
  Filled 2022-12-06: qty 500

## 2022-12-06 MED ORDER — ALBUMIN HUMAN 25 % IV SOLN
25.0000 g | Freq: Four times a day (QID) | INTRAVENOUS | Status: AC
  Administered 2022-12-06 – 2022-12-07 (×3): 25 g via INTRAVENOUS
  Filled 2022-12-06 (×3): qty 100

## 2022-12-06 MED ORDER — FENTANYL BOLUS VIA INFUSION
50.0000 ug | INTRAVENOUS | Status: DC | PRN
  Administered 2022-12-06 – 2022-12-07 (×3): 100 ug via INTRAVENOUS
  Administered 2022-12-07 (×2): 50 ug via INTRAVENOUS
  Administered 2022-12-07 (×2): 100 ug via INTRAVENOUS
  Administered 2022-12-07: 50 ug via INTRAVENOUS
  Administered 2022-12-07 (×3): 100 ug via INTRAVENOUS
  Administered 2022-12-07: 50 ug via INTRAVENOUS
  Administered 2022-12-07 (×3): 100 ug via INTRAVENOUS
  Administered 2022-12-07 – 2022-12-08 (×3): 50 ug via INTRAVENOUS
  Administered 2022-12-08: 100 ug via INTRAVENOUS
  Administered 2022-12-08 (×3): 50 ug via INTRAVENOUS

## 2022-12-06 MED ORDER — METHOCARBAMOL 1000 MG/10ML IJ SOLN
1000.0000 mg | Freq: Three times a day (TID) | INTRAVENOUS | Status: DC
  Filled 2022-12-06: qty 10

## 2022-12-06 MED ORDER — FENTANYL 2500MCG IN NS 250ML (10MCG/ML) PREMIX INFUSION
50.0000 ug/h | INTRAVENOUS | Status: DC
  Administered 2022-12-06: 50 ug/h via INTRAVENOUS
  Administered 2022-12-07: 200 ug/h via INTRAVENOUS
  Administered 2022-12-07: 100 ug/h via INTRAVENOUS
  Administered 2022-12-08: 200 ug/h via INTRAVENOUS
  Filled 2022-12-06 (×4): qty 250

## 2022-12-06 MED ORDER — ACETAMINOPHEN 10 MG/ML IV SOLN
1000.0000 mg | Freq: Four times a day (QID) | INTRAVENOUS | Status: AC
  Administered 2022-12-06 – 2022-12-07 (×4): 1000 mg via INTRAVENOUS
  Filled 2022-12-06 (×4): qty 100

## 2022-12-06 MED ORDER — INSULIN GLARGINE-YFGN 100 UNIT/ML ~~LOC~~ SOLN
6.0000 [IU] | Freq: Every day | SUBCUTANEOUS | Status: DC
  Administered 2022-12-06: 6 [IU] via SUBCUTANEOUS
  Filled 2022-12-06 (×2): qty 0.06

## 2022-12-06 MED ORDER — METHOCARBAMOL 1000 MG/10ML IJ SOLN
1000.0000 mg | Freq: Three times a day (TID) | INTRAVENOUS | Status: DC
  Administered 2022-12-06 – 2022-12-07 (×3): 1000 mg via INTRAVENOUS
  Filled 2022-12-06 (×6): qty 10

## 2022-12-06 MED ORDER — PERFLUTREN LIPID MICROSPHERE
1.0000 mL | INTRAVENOUS | Status: AC | PRN
  Administered 2022-12-06: 6 mL via INTRAVENOUS

## 2022-12-06 NOTE — Evaluation (Signed)
Physical Therapy Evaluation Patient Details Name: Gregory [name] MRN: [id number] DOB: September 13, 1960 Today's Date: 12/06/2022  History of Present Illness  KARSTON LOGUIDICE is a 62 y.o. man admitted 6/17 with abdominal pain, and urinary incontinence.  Pt underwent subtotal colectomy 6/18 and was extubated after surgery however had afib with RVR and issues with hypotensiona nd resp distress therefore re-intubated 6/19. CT revealed right occipital infarct. 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  Clinical Impression  Pt admitted with above diagnosis. Pt unable to follow commands today and was lethargic during evaluation. Note that pt is a long term resident at SNF in area.  Per PT note on last admission in 4/24 pt was able to sit EOB and was working on transfers.  Will give trial of PT to determine if pt can progress.   Pt currently with functional limitations due to the deficits listed below (see PT Problem List). Pt will benefit from acute skilled PT to increase their independence and safety with mobility to allow discharge.          Recommendations for follow up therapy are one component of a multi-disciplinary discharge planning process, led by the attending physician.  Recommendations may be updated based on patient status, additional functional criteria and insurance authorization.  Follow Up Recommendations Can patient physically be transported by private vehicle: No     Assistance Recommended at Discharge Frequent or constant Supervision/Assistance  Patient can return home with the following  Two people to help with walking and/or transfers;Two people to help with bathing/dressing/bathroom;Assistance with cooking/housework;Direct supervision/assist for medications management;Assistance with feeding;Direct supervision/assist for financial management;Assist for  transportation;Help with stairs or ramp for entrance    Equipment Recommendations Other (comment) (TBA)  Recommendations for Other Services       Functional Status Assessment Patient has had a recent decline in their functional status and demonstrates the ability to make significant improvements in function in a reasonable and predictable amount of time.     Precautions / Restrictions Precautions Precautions: Fall Precaution Comments: JP drain, colectomy, foley, NG tube, Ventilator Restrictions Weight Bearing Restrictions: No      Mobility  Bed Mobility Overal bed mobility: Needs Assistance Bed Mobility: Rolling Rolling: Total assist, +2 for physical assistance              Transfers                        Ambulation/Gait                  Stairs            Wheelchair Mobility    Modified Rankin (Stroke Patients Only) Modified Rankin (Stroke Patients Only) Pre-Morbid Rankin Score: Moderately severe disability Modified Rankin: Severe disability     Balance                                             Pertinent Vitals/Pain Pain Assessment Pain Assessment: No/denies pain    Home Living Family/patient expects to be discharged to:: Skilled nursing facility                   Additional Comments: resides at long term care facility  Prior Function Prior Level of Function : Needs assist             Mobility Comments: patient was "scooting" to his motorized wheelchair but has not been completing lately per patient per April 2024 admission ADLs Comments: patient requiring assist for showering and ADLs, when asked how much assist patient became anxious and upset     Hand Dominance   Dominant Hand: Right    Extremity/Trunk Assessment   Upper Extremity Assessment Upper Extremity Assessment: Defer to OT evaluation (Did appear to resist movement bilaterally wtih ROM)    Lower Extremity Assessment Lower  Extremity Assessment: RLE deficits/detail;LLE deficits/detail RLE Deficits / Details: AKA and pt didnt move it to command or spontaneously. LLE Deficits / Details: didnt move it to command or spontaneously.       Communication   Communication:  (Unable to assess as pt intubated orally)  Cognition Arousal/Alertness: Lethargic, Suspect due to medications Behavior During Therapy: Flat affect Overall Cognitive Status: Difficult to assess                                          General Comments General comments (skin integrity, edema, etc.): 40% FiO2, PEEP 5, VSS    Exercises General Exercises - Upper Extremity Shoulder Flexion: PROM, Both, 5 reps, Supine Elbow Flexion: PROM, Both, 5 reps, Supine Elbow Extension: PROM, Both, 5 reps, Supine General Exercises - Lower Extremity Ankle Circles/Pumps: PROM, Both, 5 reps, Supine Heel Slides: PROM, Both, 5 reps, Supine Hip ABduction/ADduction: PROM, Both, 5 reps, Supine   Assessment/Plan    PT Assessment Patient needs continued PT services  PT Problem List Decreased activity tolerance;Decreased balance;Decreased strength;Decreased mobility;Decreased knowledge of use of DME;Decreased safety awareness;Decreased cognition;Decreased knowledge of precautions;Cardiopulmonary status limiting activity;Obesity       PT Treatment Interventions DME instruction;Functional mobility training;Therapeutic activities;Therapeutic exercise;Balance training;Patient/family education;Wheelchair mobility training    PT Goals (Current goals can be found in the Care Plan section)  Acute Rehab PT Goals Patient Stated Goal: unable to state PT Goal Formulation: Patient unable to participate in goal setting Time For Goal Achievement: 12/20/22 Potential to Achieve Goals: Fair    Frequency Min 2X/week     Co-evaluation               AM-PAC PT "6 Clicks" Mobility  Outcome Measure Help needed turning from your back to your side while in  a flat bed without using bedrails?: Total Help needed moving from lying on your back to sitting on the side of a flat bed without using bedrails?: Total Help needed moving to and from a bed to a chair (including a wheelchair)?: Total Help needed standing up from a chair using your arms (e.g., wheelchair or bedside chair)?: Total Help needed to walk in hospital room?: Total Help needed climbing 3-5 steps with a railing? : Total 6 Click Score: 6    End of Session   Activity Tolerance: Patient limited by fatigue;Patient limited by lethargy Patient left: in bed;with call bell/phone within reach;with bed alarm set;with restraints reapplied Nurse Communication: Mobility status;Need for lift equipment PT Visit Diagnosis: Muscle weakness (generalized) (M62.81)    Time: 1610-9604 PT Time Calculation (min) (ACUTE ONLY): 14 min   Charges:   PT Evaluation $PT Eval Moderate Complexity: 1 Mod          Parke Jandreau M,PT Acute Rehab Services [phone]   [name]  12/06/2022, 1:15 PM

## 2022-12-06 NOTE — Progress Notes (Signed)
eLink Physician-Brief Progress Note Patient Name: Gregory [name] DOB: [birth year] MRN: [id number]   Date of Service  12/06/2022  HPI/Events of Note  All recent imaging studies reviewed.  eICU Interventions  Okay to use OG tube. Lasix 40 mg iv x 1.        Gregory [name] 12/06/2022, 12:24 AM

## 2022-12-06 NOTE — Progress Notes (Signed)
eLink Physician-Brief Progress Note Patient Name: MAKADE RONDON DOB: [birth year] MRN: [id number]   Date of Service  12/06/2022  HPI/Events of Note  I updated patient's daughter Francesca Oman) on the results of her dad's CT head and his overall clinical status. I answered all her questions.  eICU Interventions  See above.        Thomasene Lot Aisa Schoeppner 12/06/2022, 2:52 AM

## 2022-12-06 NOTE — Progress Notes (Signed)
Nutrition Follow-up  DOCUMENTATION CODES:   Obesity unspecified  INTERVENTION:   - If unable to resume enteral nutrition within next 24-48 hours, recommend initiation of TPN in the context of high nutrition risk related to increased needs for post-op recovery and wound healing  NUTRITION DIAGNOSIS:   Increased nutrient needs related to wound healing, post-op healing as evidenced by estimated needs.  Ongoing  GOAL:   Patient will meet greater than or equal to 90% of their needs  Unmet at this time  MONITOR:   Vent status, Labs, Weight trends, Skin, I & O's  REASON FOR ASSESSMENT:   Consult Enteral/tube feeding initiation and management (trickle tube feeds only, no advancement)  ASSESSMENT:   Pt with hx of DM type 2, COPD, HTN, HLD, CHF, GERD, hx CVA, and osteomyelitis s/p Right AKA presented to ED from SNF with severe abdominal pain. Workup in ED worrisome for bowel ischemia.  06/17 - admitted 06/18 - s/p exploratory laparotomy, total abdominal colectomy with end ileostomy, returned to ICU intubated 06/19 - extubated, trickle tube feeds started via NG tube, later reintubated 06/20 - TF held due to increased pressor requirements and overall worsening status, NG tube to LIWS  Discussed pt with RN and during ICU rounds. Pt required reintubation overnight. Tube feeds are being held at this time and NG tube is LIWS with ~1 L of output since start of shift. Pt is back on pressors  If unable to resume enteral nutrition within next 24-48 hours, recommend initiation of TPN in the context of high nutrition risk related to increased needs for post-op recovery and wound healing.  Admit weight: 106.6 kg Current weight: 107.1 kg  Patient is currently intubated on ventilator support Temp (24hrs), Avg:101.3 F (38.5 C), Min:99 F (37.2 C), Max:102.6 F (39.2 C)  Drips: Fentanyl Levophed: 17 mcg/min Vasopressin: 0.04 units/min  Medications reviewed and include: SSI q 4 hours,  semglee 6 units daily, IV protonix, IV abx  Labs reviewed: BUN 60, creatinine 3.56, ionized calcium 0.94, phosphorus 8.2, magnesium 3.0, hemoglobin 9.5 CBG's: 78-121 x 24 hours  UOP: 2081 ml x 24 hours NGT: 235 ml x 24 hours LUQ JP drain: 445 ml x 24 hours Ileostomy: 2 unmeasured occurrence x 24 hours I/O's: +7.1 L since admit  Diet Order:   Diet Order             Diet NPO time specified  Diet effective now                   EDUCATION NEEDS:   Not appropriate for education at this time  Skin:  Skin Assessment: Skin Integrity Issues: Stage II: R buttocks Stage III: L posterior thigh Incisions: abdomen Other: non-pressure wound (no location documented)  Last BM:  12/05/22 small type 6 via rectum  Height:   Ht Readings from Last 1 Encounters:  11/27/22 6' (1.829 m)    Weight:   Wt Readings from Last 1 Encounters:  12/06/22 107.1 kg    Ideal Body Weight:  72.8 kg (adjusted by 10% for hx of R AKA)  BMI:  Body mass index is 32.02 kg/m.  Estimated Nutritional Needs:   Kcal:  2300-2500 kcal/d  Protein:  120-140g/d  Fluid:  2.2L/d    Mertie Clause, MS, RD, LDN Inpatient Clinical Dietitian Please see AMiON for contact information.

## 2022-12-06 NOTE — Progress Notes (Addendum)
3 Days Post-Op  Subjective: Seems to be tolerating trickle TFs well but has require re-intubation and is now back on multiple pressors.  May be a component of sedation med/pain med related pressors needs in d/w CCM, but also has temp of 102.     Objective: Vital signs in last 24 hours: Temp:  [99 F (37.2 C)-101.7 F (38.7 C)] 101.7 F (38.7 C) (06/20 0715) Pulse Rate:  [74-144] 80 (06/20 0600) Resp:  [16-37] 22 (06/20 0600) BP: (90-174)/(55-76) 98/58 (06/20 0600) SpO2:  [76 %-100 %] 97 % (06/20 0600) Arterial Line BP: (79-190)/(39-87) 116/52 (06/20 0600) FiO2 (%):  [50 %-100 %] 50 % (06/20 0338) Weight:  [107.1 kg] 107.1 kg (06/20 0500) Last BM Date : 12/06/22  Intake/Output from previous day: 06/19 0701 - 06/20 0700 In: 3789.6 [I.V.:2840; NG/GT:337.3; IV Piggyback:612.3] Out: 2761 [Urine:2081; Emesis/NG output:235; Drains:445] Intake/Output this shift: No intake/output data recorded.  PE: Gen: appears critically ill Abd: obese, wound is clean and packed. JP drain with serous fluid (440cc yesterday), NGT in place TFs running presently.  Ileostomy is viable with just sweat today  Lab Results:  Recent Labs    12/05/22 0242 12/05/22 2152 12/06/22 0017 12/06/22 0431  WBC 9.9  --   --  7.8  HGB 11.0*   < > 9.9* 9.5*  HCT 34.9*   < > 29.0* 31.1*  PLT 211  --   --  168   < > = values in this interval not displayed.   BMET Recent Labs    12/05/22 2215 12/06/22 0017 12/06/22 0431  NA 136 135 137  K 4.1 4.1 3.9  CL 90*  --  94*  CO2 30  --  29  GLUCOSE 113*  --  110*  BUN 58*  --  60*  CREATININE 3.49*  --  3.56*  CALCIUM 7.2*  --  7.1*   PT/INR Recent Labs    12/03/22 1654  LABPROT 13.9  INR 1.1   CMP     Component Value Date/Time   NA 137 12/06/2022 0431   NA 138 10/22/2016 0000   K 3.9 12/06/2022 0431   CL 94 (L) 12/06/2022 0431   CO2 29 12/06/2022 0431   GLUCOSE 110 (H) 12/06/2022 0431   BUN 60 (H) 12/06/2022 0431   BUN 26 (A) 10/22/2016  0000   CREATININE 3.56 (H) 12/06/2022 0431   CREATININE 1.95 (H) 02/24/2020 0938   CREATININE 2.67 (H) 10/02/2016 1651   CALCIUM 7.1 (L) 12/06/2022 0431   PROT 5.0 (L) 12/04/2022 0941   ALBUMIN 2.1 (L) 12/04/2022 0941   AST 31 12/04/2022 0941   AST 8 (L) 02/24/2020 0938   ALT 17 12/04/2022 0941   ALT 8 02/24/2020 0938   ALKPHOS 49 12/04/2022 0941   BILITOT 0.9 12/04/2022 0941   BILITOT 0.3 02/24/2020 0938   GFRNONAA 18 (L) 12/06/2022 0431   GFRNONAA 36 (L) 02/24/2020 0938   GFRNONAA 25 (L) 10/02/2016 1651   GFRAA 42 (L) 02/24/2020 0938   GFRAA 29 (L) 10/02/2016 1651   Lipase     Component Value Date/Time   LIPASE 21 12/03/2022 1654       Studies/Results: CT HEAD WO CONTRAST ( )  Result Date: 12/05/2022 CLINICAL DATA:  Altered mental status. EXAM: CT HEAD WITHOUT CONTRAST TECHNIQUE: Contiguous axial images were obtained from the base of the skull through the vertex without intravenous contrast. RADIATION DOSE REDUCTION: This exam was performed according to the departmental dose-optimization program which includes automated  exposure control, adjustment of the mA and/or kV according to patient size and/or use of iterative reconstruction technique. COMPARISON:  Head CT dated 09/18/2022. FINDINGS: Brain: There is mild age-related atrophy and chronic microvascular ischemic changes. Evolution of right occipital infarct. There is no acute intracranial hemorrhage. No mass effect or midline shift. No extra-axial fluid collection. Vascular: No hyperdense vessel or unexpected calcification. Skull: Normal. Negative for fracture or focal lesion. Sinuses/Orbits: The visualized paranasal sinuses and mastoid air cells are clear. Other: An endotracheal and an enteric tube are partially visualized. Multiple surgical clips in the soft tissues of the right face as seen previously. IMPRESSION: 1. No acute intracranial hemorrhage. 2. Mild age-related atrophy and chronic microvascular ischemic changes. 3.  Evolution of right occipital infarct. Electronically Signed   By: Elgie Collard M.D.   On: 12/05/2022 23:56   Portable Chest x-ray  Result Date: 12/05/2022 CLINICAL DATA:  Intubated EXAM: PORTABLE CHEST 1 VIEW COMPARISON:  12/03/2022, CT 12/03/2022 FINDINGS: Endotracheal tube tip about 4.5 cm superior to the carina. Esophageal tube tip below the diaphragm but incompletely visualized. Cardiomegaly. Interim development of heterogeneous bilateral airspace disease. Right IJ central venous catheter tip over the SVC IMPRESSION: 1. Interim development of heterogeneous bilateral airspace disease, edema versus diffuse pneumonia. 2. Cardiomegaly. 3. Interim placement of support lines and tubes as above. Endotracheal tube tip about 4.5 cm superior to carina Electronically Signed   By: Jasmine Pang M.D.   On: 12/05/2022 23:00   DG Abd Portable 1V  Result Date: 12/05/2022 CLINICAL DATA:  Check gastric catheter placement EXAM: PORTABLE ABDOMEN - 1 VIEW COMPARISON:  Film from earlier in the same day. FINDINGS: Gastric catheter is noted in the more distal stomach. Multiple ingested tablets are again seen. No free air is noted. IMPRESSION: Gastric catheter within the stomach. Electronically Signed   By: Alcide Clever M.D.   On: 12/05/2022 21:18   DG Abd Portable 1V  Result Date: 12/05/2022 CLINICAL DATA:  OG tube placement EXAM: PORTABLE ABDOMEN - 1 VIEW COMPARISON:  KUB 1 day prior FINDINGS: The enteric catheter tip and sidehole project over the stomach. There is a nonobstructive bowel gas pattern in the imaged upper abdomen. There is no definite free intraperitoneal air, within the confines of supine technique. Ovoid radiodense objects in the left upper quadrant are unchanged. IMPRESSION: Enteric catheter tip and sidehole in the stomach. Electronically Signed   By: Lesia Hausen M.D.   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 There is previous removal of distal portions of first and second metatarsals. These findings have not changed. There is marked soft tissue swelling with interval worsening. There are no pockets of air in the soft tissues. Smooth marginated calcifications noted adjacent to the stump of first metatarsal have not changed. No focal lytic lesions are seen. IMPRESSION: Previous surgical removal of big toe and second toe along with the distal portions of first and second metatarsals. This finding has not changed. No recent fracture is seen. There are no focal lytic lesions. Electronically Signed   By: Ernie Avena M.D.   On: 12/04/2022 14:31   DG Abd Portable 1V  Result Date: 12/04/2022 CLINICAL DATA:  NG tube placement EXAM: PORTABLE ABDOMEN - 1 VIEW COMPARISON:  CT done on 12/03/2022 FINDINGS: Tip and  side port in NG tube are noted in the region of body of the stomach. There are multiple smooth marginated calcifications in left upper quadrant of abdomen which were shown to be between spleen and liver in the previous CT. Lower abdomen and pelvis are not included. IMPRESSION: Tip and side port in NG tube are noted within the body of the stomach. Electronically Signed   By: Ernie Avena M.D.   On: 12/04/2022 14:27    Anti-infectives: Anti-infectives (From admission, onward)    Start     Dose/Rate Route Frequency Ordered Stop   12/05/22 1800  vancomycin (VANCOREADY) IVPB 1250 mg/250 mL        1,250 mg 166.7 mL/hr over 90 Minutes Intravenous Every 48 hours 12/03/22 2346     12/05/22 1300  meropenem (MERREM) 1 g in sodium chloride 0.9 % 100 mL IVPB        1 g 200 mL/hr over 30 Minutes Intravenous Every 12 hours 12/05/22 1201     12/04/22 0100  piperacillin-tazobactam (ZOSYN) IVPB 3.375 g  Status:  Discontinued        3.375 g 12.5 mL/hr over 240 Minutes Intravenous Every 8 hours  12/03/22 2346 12/05/22 1159   12/03/22 1700  vancomycin (VANCOREADY) IVPB 2000 mg/400 mL        2,000 mg 200 mL/hr over 120 Minutes Intravenous  Once 12/03/22 1657 12/03/22 2020   12/03/22 1700  piperacillin-tazobactam (ZOSYN) IVPB 3.375 g        3.375 g 100 mL/hr over 30 Minutes Intravenous  Once 12/03/22 1657 12/03/22 1804        Assessment/Plan POD 2, 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 Sheliah Hatch 12/04/22 -NS WD dressing changes to midline wound BID -cont abx therapy, Merrem/vanc now due to ESBL in urine culture -NGT to LIWS, hold TFs in setting of pressor needs and overall worsening status -cont JP drain, not surprised by large SS output, will start to slow down over time. -WBC normal -Lactic acid pending -added scheduled Tylenol and Robaxin to help with non-narcotic pain control -doubt intra-abdominal source currently given he is only POD 2, but will monitor closely as he is a vasculopath and could potentially infarct other things. -WOC following for new ileostomy -hold any stool softeners for now.  Will likely end up need fiber/imodium with ileostomy eventually.  Will monitor this need.     FEN - NGT LIWS/IVFs per primary/pressors VTE - heparin, SQ ID - vanc/Merrem  Septic shock - back on pressors, but may be sedation related VDRF - per CCM MMP - per CCM   LOS: 3 days    Letha Cape , [facility] 12/06/2022, 9:40 AM Please see Amion for pager number during day hours 7:00am-4:30pm or 7:00am -11:30am on weekends

## 2022-12-06 NOTE — Progress Notes (Signed)
RT attempted to obtain sputum sample. Unable to collect at this time.

## 2022-12-06 NOTE — Progress Notes (Addendum)
NAME:  Gregory [name], MRN:  [id number], DOB:  07-18-1960, LOS: 3 ADMISSION DATE:  12/03/2022, CONSULTATION DATE:  12/06/2022 REFERRING MD:  EDP, CHIEF COMPLAINT:  abdominal pain   History of Present Illness:  Gregory [name] is a 62 y.o. man with COPD on home oxygen and DM2 s/p RLE AKA who is a SNF resident at [facility] with frequent admissions to the hospital.   6/17 Presents to ER with abdominal pain, urinary incontinence, and hypoxia requiring 4L [place]. Lactic Acid 3, given fluid bolus and repeat lactic acid 4. CT with concerns of ileus with dilation of large intestine with stool balls in distal colon. Surgery consulted given rising lactic acid and concern for mesenteric ischemia. Plan to given enema to clear stool burden with frequent serial examinations. Admitted with critical care. WBC 28.7. Started on Vancomycin/Zosyn.   6/18 with worsening lactic acidosis and physical examination, decision made to proceed to OR for exploration. 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 Post-operative setting presented to ICU intubated with hypotension requiring NEO, EPI, Levophed, and vasopressin, with bicarb gtt. 6/19 vasopressor requirements improvement. Patient extubated.   Pertinent  Medical History  S/p RLE AKA DM2 COPD on home oxygen  Significant Hospital Events: Including procedures, antibiotic start and stop dates in addition to other pertinent events   6/17 admitted, s/p exploratory laparotomy for total colectomy due to ischemic colon 6/17 urine>> EColi>> 6/19 > extubated. A.Fib RVR  Interim History / Subjective:  Requiring re-intubation overnight for respiratory failure. This AM on 180 NEO. 18 Levophed.   Objective   Blood pressure (!) 98/58, pulse 80, temperature (!) 101.7 F (38.7 C), resp. rate (!) 22, weight 107.1 kg, SpO2 97 %.    Vent Mode: PRVC FiO2 (%):  [40 %-100 %] 50 % Set Rate:  [20 bmp] 20 bmp Vt Set:  [409 mL] 620 mL PEEP:  [5  cmH20] 5 cmH20 Pressure Support:  [8 cmH20] 8 cmH20 Plateau Pressure:  [22 cmH20] 22 cmH20   Intake/Output Summary (Last 24 hours) at 12/06/2022 0741 Last data filed at 12/06/2022 0600 Gross per 24 hour  Intake 3789.61 ml  Output 2761 ml  Net 1028.61 ml   Filed Weights   12/04/22 0435 12/05/22 0500 12/06/22 0500  Weight: 106.6 kg 107.4 kg 107.1 kg   Examination: General: Critically/Chronically ill appearing adult male, on vent  HENT: ETT/NG in place  Lungs: Coarse breath sounds, no use of accessory muscles, vent assisted breaths  Cardiovascular: Regular, HR 82, no mRG  Abdomen: grimacing with ABD palpation, hypoactive bowel sounds, ostomy noted, surgical incision midline with dressing clean and intact   Extremities: RLE AKA, left foot open wound - amputated digits on left foot, wrapped Neuro: sedated, does not follow commands GU: foley in place  Resolved Hospital Problem list     Assessment & Plan:   Septic Shock in setting of ischemic colitis s/p total colectomy 6/17, E.Coli UTI, +/-sedation effect  A.Fib 6/19  Plan - Cardiac Monitoring - Titrate Vasopressors for MAP goal >65 >> Add vasopressin. D/C NEO. 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 Connect NG to [facility] - WOC following   Acute on chronic hypoxemic respiratory failure COPD Plan - Vent support - 8cc/kg  - ABG PRN  - can trial PS today but no plans to extubate given current clinical status   AKI on CKD Stage 3B Anion Gap Metabolic Acidosis Lactic Acidosis > resolved  Mild Hyponatremia Mild hyperkalemia   Hyperphosphatemia  Plan - Trend BMP  - Trend Mag/Phos  - Strict I&O  - With adequate urinary output, metabolic state improved off bicarb gtt, if hyperkalemic or aneuric will consult nephrology   Anemia, Acute blood loss > stable  Plan - Trend CBC   Type 2 DM Plan  - glargine -decreased to 6 units given NPO  - holding home meds   BPH Plan - holding flomax given hypotension   Pain  Sedation  Plan - Change Precedex to Fentanyl given severe hypotension with concerns precedex contributing as well as febrile state with no WBC or lactic acidosis that could also be coming from precedex  - Continue home Lexapro, Seroquel, Oxycodone  - Surgery added scheduled IV tylenol and robaxin 6/20   Neuropathy - holding home meds for now given AKI  PVD HTN HLD Holding metoprolol, statin, asa for now  Best Practice (right click and "Reselect all SmartList Selections" daily)   Diet/type: NPO DVT prophylaxis: prophylactic heparin  GI prophylaxis: PPI Lines: Central line, Arterial Line, and yes and it is still needed Foley:  Yes, and it is still needed Code Status:  full code Last date of multidisciplinary goals of care discussion [ pending ]  Labs   CBC: Recent Labs  Lab 12/03/22 1654 12/03/22 1715 12/04/22 0414 12/04/22 0424 12/04/22 0941 12/05/22 0242 12/05/22 2152 12/06/22 0017 12/06/22 0431  WBC 28.7*  --  7.4  --  8.9 9.9  --   --  7.8  NEUTROABS 25.6*  --   --   --   --   --   --   --   --   HGB 14.4   < > 10.5*   < > 12.4* 11.0* 10.2* 9.9* 9.5*  HCT 47.3   < > 34.4*   < > 39.8 34.9* 30.0* 29.0* 31.1*  MCV 90.8  --  89.6  --  87.7 86.2  --   --  88.6  PLT 500*  --  240  --  253 211  --   --  168   < > = values in this interval not displayed.    Basic Metabolic Panel: Recent Labs  Lab 12/04/22 0414 12/04/22 0424 12/04/22 0941 12/05/22 0242 12/05/22 1320 12/05/22 1321 12/05/22 2152 12/05/22 2215 12/06/22 0017 12/06/22 0431  NA 134*   < > 133* 134*  --   135 135 136 135 137  K 3.8   < > 4.1 5.2*  --  4.2 4.2 4.1 4.1 3.9  CL 102  --  99 91*  --  91*  --  90*  --  94*  CO2 20*  --  21* 24  --  30  --  30  --  29  GLUCOSE 268*  --  287* 200*  --  129*  --  113*  --  110*  BUN 50*  --  53* 54*  --  53*  --  58*  --  60*  CREATININE 3.24*  --  3.44* 3.27*  --  3.50*  --  3.49*  --  3.56*  CALCIUM 7.2*  --  7.7* 7.4*  --  7.1*  --  7.2*  --  7.1*  MG 2.9*  --   --  2.9* 2.9*  --   --  2.8*  --   --   PHOS 5.2*  --   --  6.6*  --   --   --   --   --   --    < > = values in this interval not displayed.   GFR: Estimated Creatinine Clearance: 26.9 mL/min (A) (by C-G formula based on SCr of 3.56 mg/dL (H)). Recent Labs  Lab 12/03/22 2040 12/03/22 2240 12/04/22 0414 12/04/22 0941 12/05/22 0242 12/06/22 0431  WBC  --   --  7.4 8.9 9.9 7.8  LATICACIDVEN 6.1* 8.6* 3.2* 2.5*  --   --     Liver Function Tests: Recent Labs  Lab 12/03/22 1654 12/04/22 0941  AST 24 31  ALT 18 17  ALKPHOS 78 49  BILITOT 1.0 0.9  PROT 8.4* 5.0*  ALBUMIN 3.5 2.1*   Recent Labs  Lab 12/03/22 1654  LIPASE 21   No results for input(s): "AMMONIA" in the last 168 hours.  ABG    Component Value Date/Time   PHART 7.421 12/06/2022 0017   PCO2ART 51.9 (H) 12/06/2022 0017   PO2ART 79 (L) 12/06/2022 0017   HCO3 33.4 (H) 12/06/2022 0017   TCO2 35 (H) 12/06/2022 0017   ACIDBASEDEF 7.0 (H) 12/04/2022 0601   O2SAT 95 12/06/2022 0017     Coagulation Profile: Recent Labs  Lab 12/03/22 1654  INR 1.1    Cardiac Enzymes: No results for input(s): "CKTOTAL", "CKMB", "CKMBINDEX", "TROPONINI" in the last 168 hours.  HbA1C: Hemoglobin A1C  Date/Time Value Ref Range Status  08/14/2016 12:00 AM 5.6  Final  01/18/2016 12:00 AM 5.5  Final   Hgb A1c MFr Bld  Date/Time Value Ref Range Status  12/03/2022 04:54 PM 6.9 (H) 4.8 - 5.6 % Final    Comment:    (NOTE) Pre diabetes:          5.7%-6.4%  Diabetes:              >6.4%  Glycemic control for    <7.0% adults with diabetes   05/01/2022 08:13 AM 6.9 (H) 4.8 - 5.6 % Final    Comment:    (NOTE) Pre diabetes:          5.7%-6.4%  Diabetes:              >6.4%  Glycemic control for   <7.0% adults with diabetes     CBG: Recent Labs  Lab 12/05/22 1502 12/05/22 1909 12/05/22 2314 12/06/22 0324 12/06/22 0713  GLUCAP 111* 78 93 99 104*     Critical care time: 45 min    CRITICAL CARE Performed by: Tobey Grim   Total critical care time: 45 minutes  Critical care time was exclusive of separately billable procedures and treating other patients.  Critical care was necessary to treat or prevent imminent or life-threatening deterioration.  Critical care was time spent personally by me on the following activities: development of treatment plan with patient and/or surrogate as well as nursing, discussions with consultants, evaluation of patient's response to treatment, examination of patient, obtaining history from patient or surrogate, ordering and performing treatments and interventions, ordering and review of laboratory studies, ordering and review of radiographic studies, pulse oximetry and re-evaluation of patient's condition.

## 2022-12-06 NOTE — Progress Notes (Signed)
Pt transported from 3M11 to CT and back to M11 on the ventilator with no complications.

## 2022-12-06 NOTE — Progress Notes (Signed)
Intakes not recorded since 0300

## 2022-12-06 NOTE — Progress Notes (Signed)
  Echocardiogram 2D Echocardiogram has been performed.  Maren Reamer 12/06/2022, 10:06 AM

## 2022-12-07 DIAGNOSIS — R579 Shock, unspecified: Secondary | ICD-10-CM | POA: Diagnosis not present

## 2022-12-07 LAB — CBC
HCT: 24.9 % — ABNORMAL LOW (ref 39.0–52.0)
Hemoglobin: 7.9 g/dL — ABNORMAL LOW (ref 13.0–17.0)
MCH: 27.4 pg (ref 26.0–34.0)
MCHC: 31.7 g/dL (ref 30.0–36.0)
MCV: 86.5 fL (ref 80.0–100.0)
Platelets: 119 10*3/uL — ABNORMAL LOW (ref 150–400)
RBC: 2.88 MIL/uL — ABNORMAL LOW (ref 4.22–5.81)
RDW: 15.6 % — ABNORMAL HIGH (ref 11.5–15.5)
WBC: 4 10*3/uL (ref 4.0–10.5)
nRBC: 0 % (ref 0.0–0.2)

## 2022-12-07 LAB — CULTURE, BLOOD (ROUTINE X 2): Culture: NO GROWTH

## 2022-12-07 LAB — BASIC METABOLIC PANEL
Anion gap: 15 (ref 5–15)
Anion gap: 16 — ABNORMAL HIGH (ref 5–15)
Anion gap: 12 (ref 5–15)
BUN: 59 mg/dL — ABNORMAL HIGH (ref 8–23)
BUN: 56 mg/dL — ABNORMAL HIGH (ref 8–23)
BUN: 54 mg/dL — ABNORMAL HIGH (ref 8–23)
CO2: 28 mmol/L (ref 22–32)
CO2: 29 mmol/L (ref 22–32)
CO2: 28 mmol/L (ref 22–32)
Calcium: 7.8 mg/dL — ABNORMAL LOW (ref 8.9–10.3)
Calcium: 8.1 mg/dL — ABNORMAL LOW (ref 8.9–10.3)
Calcium: 7.9 mg/dL — ABNORMAL LOW (ref 8.9–10.3)
Chloride: 96 mmol/L — ABNORMAL LOW (ref 98–111)
Chloride: 96 mmol/L — ABNORMAL LOW (ref 98–111)
Chloride: 98 mmol/L (ref 98–111)
Creatinine, Ser: 3.47 mg/dL — ABNORMAL HIGH (ref 0.61–1.24)
Creatinine, Ser: 3.31 mg/dL — ABNORMAL HIGH (ref 0.61–1.24)
Creatinine, Ser: 3.19 mg/dL — ABNORMAL HIGH (ref 0.61–1.24)
GFR, Estimated: 19 mL/min — ABNORMAL LOW (ref >60)
GFR, Estimated: 20 mL/min — ABNORMAL LOW (ref >60)
GFR, Estimated: 21 mL/min — ABNORMAL LOW (ref >60)
Glucose, Bld: 87 mg/dL (ref 70–99)
Glucose, Bld: 86 mg/dL (ref 70–99)
Glucose, Bld: 96 mg/dL (ref 70–99)
Potassium: 3 mmol/L — ABNORMAL LOW (ref 3.5–5.1)
Potassium: 3.1 mmol/L — ABNORMAL LOW (ref 3.5–5.1)
Potassium: 3 mmol/L — ABNORMAL LOW (ref 3.5–5.1)
Sodium: 139 mmol/L (ref 135–145)
Sodium: 141 mmol/L (ref 135–145)
Sodium: 138 mmol/L (ref 135–145)

## 2022-12-07 LAB — GLUCOSE, CAPILLARY
Glucose-Capillary: 101 mg/dL — ABNORMAL HIGH (ref 70–99)
Glucose-Capillary: 105 mg/dL — ABNORMAL HIGH (ref 70–99)
Glucose-Capillary: 77 mg/dL (ref 70–99)
Glucose-Capillary: 82 mg/dL (ref 70–99)
Glucose-Capillary: 88 mg/dL (ref 70–99)
Glucose-Capillary: 90 mg/dL (ref 70–99)
Glucose-Capillary: 94 mg/dL (ref 70–99)

## 2022-12-07 LAB — HEPATIC FUNCTION PANEL
ALT: 20 U/L (ref 0–44)
AST: 35 U/L (ref 15–41)
Albumin: 2.3 g/dL — ABNORMAL LOW (ref 3.5–5.0)
Alkaline Phosphatase: 77 U/L (ref 38–126)
Bilirubin, Direct: 0.7 mg/dL — ABNORMAL HIGH (ref 0.0–0.2)
Indirect Bilirubin: 0.9 mg/dL (ref 0.3–0.9)
Total Bilirubin: 1.6 mg/dL — ABNORMAL HIGH (ref 0.3–1.2)
Total Protein: 5.3 g/dL — ABNORMAL LOW (ref 6.5–8.1)

## 2022-12-07 LAB — MAGNESIUM: Magnesium: 2.9 mg/dL — ABNORMAL HIGH (ref 1.7–2.4)

## 2022-12-07 LAB — PHOSPHORUS: Phosphorus: 7.8 mg/dL — ABNORMAL HIGH (ref 2.5–4.6)

## 2022-12-07 MED ORDER — WHITE PETROLATUM EX OINT
TOPICAL_OINTMENT | CUTANEOUS | Status: DC | PRN
  Filled 2022-12-07: qty 28.35

## 2022-12-07 MED ORDER — POTASSIUM CHLORIDE 10 MEQ/50ML IV SOLN
10.0000 meq | INTRAVENOUS | Status: AC
  Administered 2022-12-07 (×3): 10 meq via INTRAVENOUS
  Filled 2022-12-07 (×3): qty 50

## 2022-12-07 MED ORDER — ACETAMINOPHEN 160 MG/5ML PO SOLN
1000.0000 mg | Freq: Four times a day (QID) | ORAL | Status: DC
  Administered 2022-12-07 – 2022-12-11 (×16): 1000 mg
  Filled 2022-12-07 (×16): qty 40.6

## 2022-12-07 MED ORDER — AMIODARONE HCL IN DEXTROSE 360-4.14 MG/200ML-% IV SOLN
60.0000 mg/h | INTRAVENOUS | Status: DC
  Administered 2022-12-07 (×2): 60 mg/h via INTRAVENOUS
  Filled 2022-12-07 (×3): qty 200

## 2022-12-07 MED ORDER — GABAPENTIN 250 MG/5ML PO SOLN: 100.0000 mg | Freq: Two times a day (BID) | ORAL | Status: DC

## 2022-12-07 MED ORDER — AMIODARONE LOAD VIA INFUSION
150.0000 mg | Freq: Once | INTRAVENOUS | Status: AC
  Administered 2022-12-07: 150 mg via INTRAVENOUS
  Filled 2022-12-07: qty 83.34

## 2022-12-07 MED ORDER — DEXTROSE 5 % IV SOLN: INTRAVENOUS | Status: AC

## 2022-12-07 MED ORDER — LIDOCAINE 5 % EX PTCH
2.0000 | MEDICATED_PATCH | Freq: Every day | CUTANEOUS | Status: DC
  Administered 2022-12-07 – 2022-12-16 (×10): 2 via TRANSDERMAL
  Filled 2022-12-07 (×19): qty 2

## 2022-12-07 MED ORDER — METHOCARBAMOL 500 MG PO TABS
1000.0000 mg | ORAL_TABLET | Freq: Three times a day (TID) | ORAL | Status: DC
  Administered 2022-12-07 – 2022-12-13 (×19): 1000 mg
  Filled 2022-12-07 (×19): qty 2

## 2022-12-07 MED ORDER — AMIODARONE HCL IN DEXTROSE 360-4.14 MG/200ML-% IV SOLN
30.0000 mg/h | INTRAVENOUS | Status: DC
  Administered 2022-12-08: 60 mg/h via INTRAVENOUS
  Administered 2022-12-08: 30 mg/h via INTRAVENOUS
  Administered 2022-12-08 – 2022-12-09 (×3): 60 mg/h via INTRAVENOUS
  Filled 2022-12-07 (×5): qty 200

## 2022-12-07 MED ORDER — INSULIN GLARGINE-YFGN 100 UNIT/ML ~~LOC~~ SOLN
4.0000 [IU] | Freq: Every day | SUBCUTANEOUS | Status: DC
  Administered 2022-12-07 – 2022-12-11 (×5): 4 [IU] via SUBCUTANEOUS
  Filled 2022-12-07 (×6): qty 0.04

## 2022-12-07 MED ORDER — POTASSIUM CHLORIDE 10 MEQ/100ML IV SOLN
10.0000 meq | INTRAVENOUS | Status: DC
  Filled 2022-12-07 (×4): qty 100

## 2022-12-07 MED ORDER — POTASSIUM CHLORIDE 10 MEQ/50ML IV SOLN
10.0000 meq | INTRAVENOUS | Status: AC
  Administered 2022-12-08 (×4): 10 meq via INTRAVENOUS
  Filled 2022-12-07 (×4): qty 50

## 2022-12-07 MED ORDER — MIDODRINE HCL 5 MG PO TABS
5.0000 mg | ORAL_TABLET | Freq: Three times a day (TID) | ORAL | Status: DC
  Administered 2022-12-07 (×2): 5 mg
  Filled 2022-12-07 (×2): qty 1

## 2022-12-07 MED ORDER — GABAPENTIN 250 MG/5ML PO SOLN
50.0000 mg | Freq: Two times a day (BID) | ORAL | Status: DC
  Administered 2022-12-07 – 2022-12-08 (×3): 50 mg
  Filled 2022-12-07 (×3): qty 2

## 2022-12-07 NOTE — Progress Notes (Signed)
eLink Physician-Brief Progress Note Patient Name: Gregory [name] DOB: 03/12/1961 MRN: [id number]   Date of Service  12/07/2022  HPI/Events of Note  Patient going in and out of Atrial fibrillation, rate 115 to 130's, received Amiodarone gtt recently but was stopped after he converted to sinus rhythm, anticoagulation was deferred due to bleeding risk.  eICU Interventions  Will bolus Amiodarone and resume gtt, will defer anticoagulation decision to daytime PCCM attending physician.        Migdalia Dk 12/07/2022, 8:35 PM

## 2022-12-07 NOTE — Progress Notes (Signed)
Brief Nutrition Support Note  Discussed pt with RN and during ICU rounds. Pt last seen by RD for full follow-up yesterday. Pt is off all pressors today. Pt is having output from ileostomy. Per CCM MD, hold off on starting tube feeds today. Possibility of starting trickle tube feeds tomorrow. Pt may be extubated today.  Trickle tube feeding recommendations: - Vital 1.5 @ 20 ml/hr (480 ml/day)  Goal tube feeding recommendations: - Vital 1.5 @ 65 ml/hr (1560 ml/day) - PROSource TF20 60 ml daily  Recommended tube feeding regimen at goal rate would provide 2420 kcal, 125 grams of protein, and 1192 ml of H2O.  If unable to resume enteral nutrition within next 24 hours, recommend initiation of TPN in the context of high nutrition risk related to increased needs for post-op recovery and wound healing.  RD will continue to follow pt during admission.   Mertie Clause, MS, RD, LDN Inpatient Clinical Dietitian Please see AMiON for contact information.

## 2022-12-07 NOTE — Progress Notes (Signed)
Pharmacy Antibiotic Note  Gregory [name] is a 62 y.o. male admitted on 12/03/2022 with severe abdominal pain with concerns for sepsis.   Urine culture now positive for ESBL E coli intermediate to Zosyn. Pharmacy has been consulted for meropenem and vancomycin dosing.   WBC trending down to 4 Fever curve trending down and pt is now afebrile SCr 3.47 (remains elevated)  Plan: Continue Meropenem 1g IV q12h  Continue Vancomycin 1250 mg IV every 48 hours (AUC 441, Vd 0.5, sCr 3.3)    > Goal AUC 400-550    > Check vancomycin levels at steady state  Monitor renal function for dose change of vancomycin Follow up WBC, fever curve, signs of clinical improvement, and de-escalation of antibiotics  Weight: 103.5 kg (228 lb 2.8 oz)  Temp (24hrs), Avg:99.9 F (37.7 C), Min:98.1 F (36.7 C), Max:102.4 F (39.1 C)  Recent Labs  Lab 12/03/22 2040 12/03/22 2240 12/04/22 0414 12/04/22 0941 12/05/22 0242 12/05/22 1321 12/05/22 2215 12/06/22 0431 12/06/22 0900 12/06/22 1444 12/06/22 2008 12/07/22 0301  WBC  --   --  7.4 8.9 9.9  --   --  7.8  --   --   --  4.0  CREATININE  --   --  3.24* 3.44* 3.27*   < > 3.49* 3.56*  --  3.71* 3.69* 3.47*  LATICACIDVEN 6.1* 8.6* 3.2* 2.5*  --   --   --   --  1.6  --   --   --    < > = values in this interval not displayed.     Estimated Creatinine Clearance: 27.1 mL/min (A) (by C-G formula based on SCr of 3.47 mg/dL (H)).    Allergies  Allergen Reactions   Latex Other (See Comments)    "ALLERGIC," per MAR    Antimicrobials this admission: Zosyn 6/17 >> 6/19 Vancomycin 6/17 >>  Meropenem 6/19 >>   Microbiology results: 6/17 MRSA PCR: detected 6/17 BCx x2: NGTD x4 days 6/17 UCx: >100K ESBL E. coli (S: gentamicin, imipenem, nitrofurantoin; I: cefepime, Zosyn) 6/20 Tracheal aspirate: sent 6/20 BCx x2: NGTD <24h  Thank you for allowing pharmacy to be a part of this patient's care.  Wilburn Cornelia, PharmD, BCPS Clinical Pharmacist 12/07/2022 10:30  AM   Please refer to [facility] for pharmacy phone number

## 2022-12-07 NOTE — Consult Note (Signed)
WOC Nurse ostomy consult note Stoma type/location: RMQ ileostomy. Pouching system applied on 12/05/22 by my associate K. Allyne Gee is intact.  Stomal assessment/size: 1 and 3/4 inches. Thin pink effluent in pouch. Patient with critical illness and remains in ICU. Supplies ordered to bedside   2 and 1/4 inch pouching system components:  5 skin barriers, Lawson # [phone]  5 high output pouches, Lawson # [id number] 5 standard pouches, Lawson # [phone] and  5 skin barrier rings, Lawson # [phone]  The assistance of both the Unit Secretary and the Bedside RN, D. White is appreciated.  WOC nursing team will follow for ostomy care and teaching when appropriate, and will remain available to this patient, the nursing and medical teams.    Thank you for inviting [place] to participate in this patient's Plan of Care.  Ladona Mow, MSN, RN, CNS, GNP, Leda Min, [employer], [employer] phone:  [phone]

## 2022-12-07 NOTE — Progress Notes (Signed)
NAME:  Gregory [name], MRN:  [id number], DOB:  [birth year], LOS: 4 ADMISSION DATE:  12/03/2022, CONSULTATION DATE:  12/07/2022 REFERRING MD:  EDP, CHIEF COMPLAINT:  abdominal pain   History of Present Illness:  Gregory [name] is a 62 y.o. man with COPD on home oxygen and DM2 s/p RLE AKA who is a SNF resident at [facility] with frequent admissions to the hospital.   6/17 Presents to ER with abdominal pain, urinary incontinence, and hypoxia requiring 4L Ava. Lactic Acid 3, given fluid bolus and repeat lactic acid 4. CT with concerns of ileus with dilation of large intestine with stool balls in distal colon. Surgery consulted given rising lactic acid and concern for mesenteric ischemia. Plan to given enema to clear stool burden with frequent serial examinations. Admitted with critical care. WBC 28.7. Started on Vancomycin/Zosyn.   6/18 with worsening lactic acidosis and physical examination, decision made to proceed to OR for exploration. 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 Post-operative setting presented to ICU intubated with hypotension requiring NEO, EPI, Levophed, and vasopressin, with bicarb gtt. 6/19 vasopressor requirements improvement. Patient extubated.   Pertinent  Medical History  S/p RLE AKA DM2 COPD on home oxygen  Significant Hospital Events: Including procedures, antibiotic start and stop dates in addition to other pertinent events   6/17 admitted, s/p exploratory laparotomy for total colectomy due to ischemic colon 6/17 urine>> EColi>> 6/19 > extubated. A.Fib RVR - re intubated that night  Interim History / Subjective:   No acute events overnight He has been weaned off vasopressors this morning He has output from ileostomy  Objective   Blood pressure 120/70, pulse 67, temperature 98.4 F (36.9 C), resp. rate 14, weight 103.5 kg, SpO2 100 %.    Vent Mode: PRVC FiO2 (%):  [40 %] 40 % Set Rate:  [20 bmp] 20 bmp Vt Set:  [469  mL] 620 mL PEEP:  [5 cmH20] 5 cmH20 Plateau Pressure:  [16 cmH20-19 cmH20] 16 cmH20   Intake/Output Summary (Last 24 hours) at 12/07/2022 6295 Last data filed at 12/07/2022 0600 Gross per 24 hour  Intake 2521.45 ml  Output 5365 ml  Net -2843.55 ml   Filed Weights   12/05/22 0500 12/06/22 0500 12/07/22 0301  Weight: 107.4 kg 107.1 kg 103.5 kg   Examination: General: Critically/Chronically ill appearing adult male, on vent  HENT: ETT/NG in place  Lungs: Coarse breath sounds, no wheezing Cardiovascular: tachycardic Abdomen: RUQ tenderness, ostomy is pink with good output,, surgical incision midline with dressing clean and intact   Extremities: RLE AKA, left foot open wound - amputated digits on left foot, wrapped Neuro: sedated, does not follow commands GU: foley in place  Resolved Hospital Problem list     Assessment & Plan:   Septic Shock in setting of ischemic colitis s/p total colectomy 6/17, E.Coli UTI, +/-sedation effect  Plan - Cardiac Monitoring - Titrate Vasopressors for MAP goal >65 - start midodrine 5mg  TID - continue vancomycin and meropenem - f/u cultures  Acute on chronic hypoxemic respiratory failure COPD Plan - Vent support - 8cc/kg  - ABG PRN  - PSV trial today, if we can manage pain better will try to extubate  A.Fib 6/19  Plan - weaned off amiodarone 6/20 as back in NSR - no heparin drip due to down trending H/H and platelets - Echo 6/20 with EF 50%, no significant valve issues  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 Plan - General  surgery following - Will resume meds via tube today given good output, will plan to resume TF tomorrow if no issues  - WOC following   AKI on CKD Stage 3B Anion Gap Metabolic Acidosis Lactic Acidosis > resolved  Mild Hyponatremia Mild hyperkalemia  Hyperphosphatemia  Plan - Trend BMP  - Trend Mag/Phos  - Strict I&O  - With adequate urinary output, metabolic state improved off bicarb gtt, if  hyperkalemic or aneuric will consult nephrology   Anemia, Acute blood loss > stable  Plan - Trend CBC   Type 2 DM Plan  - glargine -decreased to 6 units given NPO  - holding home meds   BPH Plan - holding flomax given hypotension   Pain  Sedation  Plan - IV fentanyl - PO oxycodone 20mg  q8hrs - Continue home Lexapro, Seroquel, Oxycodone  - Surgery added scheduled IV tylenol and robaxin 6/20   Neuropathy - start home gabapentin at reduced of 50mg  BID  PVD HTN HLD Holding metoprolol, statin, asa for now  Best Practice (right click and "Reselect all SmartList Selections" daily)   Diet/type: NPO w/ meds via tube DVT prophylaxis: not indicated GI prophylaxis: PPI Lines: Central line, Arterial Line, and yes and it is still needed Foley:  Yes, and it is still needed Code Status:  full code Last date of multidisciplinary goals of care discussion [ pending ]  Labs   CBC: Recent Labs  Lab 12/03/22 1654 12/03/22 1715 12/04/22 0414 12/04/22 0424 12/04/22 0941 12/05/22 0242 12/05/22 2152 12/06/22 0017 12/06/22 0431 12/07/22 0301  WBC 28.7*  --  7.4  --  8.9 9.9  --   --  7.8 4.0  NEUTROABS 25.6*  --   --   --   --   --   --   --   --   --   HGB 14.4   < > 10.5*   < > 12.4* 11.0* 10.2* 9.9* 9.5* 7.9*  HCT 47.3   < > 34.4*   < > 39.8 34.9* 30.0* 29.0* 31.1* 24.9*  MCV 90.8  --  89.6  --  87.7 86.2  --   --  88.6 86.5  PLT 500*  --  240  --  253 211  --   --  168 119*   < > = values in this interval not displayed.    Basic Metabolic Panel: Recent Labs  Lab 12/04/22 0414 12/04/22 0424 12/05/22 0242 12/05/22 1320 12/05/22 1321 12/05/22 2215 12/06/22 0017 12/06/22 0431 12/06/22 0900 12/06/22 1444 12/06/22 2008 12/07/22 0301  NA 134*   < > 134*  --    < > 136 135 137  --  137 139 139  K 3.8   < > 5.2*  --    < > 4.1 4.1 3.9  --  3.7 3.5 3.0*  CL 102   < > 91*  --    < > 90*  --  94*  --  95* 96* 96*  CO2 20*   < > 24  --    < > 30  --  29  --  26 28 28    GLUCOSE 268*   < > 200*  --    < > 113*  --  110*  --  135* 123* 87  BUN 50*   < > 54*  --    < > 58*  --  60*  --  63* 59* 59*  CREATININE 3.24*   < > 3.27*  --    < >  3.49*  --  3.56*  --  3.71* 3.69* 3.47*  CALCIUM 7.2*   < > 7.4*  --    < > 7.2*  --  7.1*  --  7.3* 7.5* 7.8*  MG 2.9*  --  2.9* 2.9*  --  2.8*  --   --  3.0*  --   --  2.9*  PHOS 5.2*  --  6.6*  --   --   --   --   --  8.2*  --   --  7.8*   < > = values in this interval not displayed.   GFR: Estimated Creatinine Clearance: 27.1 mL/min (A) (by C-G formula based on SCr of 3.47 mg/dL (H)). Recent Labs  Lab 12/03/22 2240 12/04/22 0414 12/04/22 0941 12/05/22 0242 12/06/22 0431 12/06/22 0900 12/07/22 0301  WBC  --  7.4 8.9 9.9 7.8  --  4.0  LATICACIDVEN 8.6* 3.2* 2.5*  --   --  1.6  --     Liver Function Tests: Recent Labs  Lab 12/03/22 1654 12/04/22 0941 12/06/22 0900  AST 24 31 43*  ALT 18 17 21   ALKPHOS 78 49 85  BILITOT 1.0 0.9 1.8*  PROT 8.4* 5.0* 5.0*  ALBUMIN 3.5 2.1* <1.5*   Recent Labs  Lab 12/03/22 1654  LIPASE 21   No results for input(s): "AMMONIA" in the last 168 hours.  ABG    Component Value Date/Time   PHART 7.421 12/06/2022 0017   PCO2ART 51.9 (H) 12/06/2022 0017   PO2ART 79 (L) 12/06/2022 0017   HCO3 33.4 (H) 12/06/2022 0017   TCO2 35 (H) 12/06/2022 0017   ACIDBASEDEF 7.0 (H) 12/04/2022 0601   O2SAT 58.8 12/06/2022 1125     Coagulation Profile: Recent Labs  Lab 12/03/22 1654  INR 1.1    Cardiac Enzymes: No results for input(s): "CKTOTAL", "CKMB", "CKMBINDEX", "TROPONINI" in the last 168 hours.  HbA1C: Hemoglobin A1C  Date/Time Value Ref Range Status  08/14/2016 12:00 AM 5.6  Final  01/18/2016 12:00 AM 5.5  Final   Hgb A1c MFr Bld  Date/Time Value Ref Range Status  12/03/2022 04:54 PM 6.9 (H) 4.8 - 5.6 % Final    Comment:    (NOTE) Pre diabetes:          5.7%-6.4%  Diabetes:              >6.4%  Glycemic control for   <7.0% adults with diabetes    05/01/2022 08:13 AM 6.9 (H) 4.8 - 5.6 % Final    Comment:    (NOTE) Pre diabetes:          5.7%-6.4%  Diabetes:              >6.4%  Glycemic control for   <7.0% adults with diabetes     CBG: Recent Labs  Lab 12/06/22 1504 12/06/22 1934 12/06/22 2358 12/07/22 0301 12/07/22 0751  GLUCAP 134* 123* 101* 88 105*     Critical care time: 40 min    CRITICAL CARE Performed by: Martina Sinner   Total critical care time: 40 minutes  Critical care time was exclusive of separately billable procedures and treating other patients.  Critical care was necessary to treat or prevent imminent or life-threatening deterioration.  Critical care was time spent personally by me on the following activities: development of treatment plan with patient and/or surrogate as well as nursing, discussions with consultants, evaluation of patient's response to treatment, examination of patient, obtaining history from patient or  surrogate, ordering and performing treatments and interventions, ordering and review of laboratory studies, ordering and review of radiographic studies, pulse oximetry and re-evaluation of patient's condition.  Melody Comas, MD [facility] Office: [phone]   See Amion for personal pager PCCM on call pager [phone] until 7pm. Please call Elink 7p-7a. [phone]

## 2022-12-07 NOTE — Progress Notes (Signed)
   12/07/22 1100  Spiritual Encounters  Type of Visit Initial  Care provided to: Patient  Conversation partners present during encounter Nurse  Referral source Family  Reason for visit Routine spiritual support  OnCall Visit No  Spiritual Framework  Presenting Themes Rituals and practive  Interventions  Spiritual Care Interventions Made Compassionate presence;Established relationship of care and support;Prayer  Intervention Outcomes  Outcomes Reduced anxiety;Awareness of support   Visited Patient in response to consult. Spoke with RN to obtain information before entering room. Was advised as to the situation. Spoke with who had intubation and was unable to speak, but understood chaplain and responded. Patient welcomed prayer (which was what daughter requested on consult). Prayed with patient. Daughter was not there however expected this afternoon. Per RN intubation expected to be removed this afternoon as well.

## 2022-12-07 NOTE — Progress Notes (Signed)
4 Days Post-Op  Subjective: Biggest issue is pain control, somewhat limiting extubation.  Off pressors again today.  Fevers has resolved since yesterday afternoon.   Objective: Vital signs in last 24 hours: Temp:  [98.1 F (36.7 C)-102.6 F (39.2 C)] 98.1 F (36.7 C) (06/21 0900) Pulse Rate:  [61-123] 123 (06/21 0900) Resp:  [14-22] 16 (06/21 0900) BP: (99-132)/(57-70) 120/70 (06/21 0800) SpO2:  [95 %-100 %] 100 % (06/21 0900) Arterial Line BP: (103-177)/(43-62) 129/50 (06/21 0900) FiO2 (%):  [40 %] 40 % (06/21 0800) Weight:  [103.5 kg] 103.5 kg (06/21 0301) Last BM Date : 12/07/22  Intake/Output from previous day: 06/20 0701 - 06/21 0700 In: 3604.9 [I.V.:2068.1; NG/GT:300; IV Piggyback:1236.8] Out: 5555 [Urine:2125; Emesis/NG output:1800; Drains:555; Stool:1075] Intake/Output this shift: No intake/output data recorded.  PE: Gen: awake and nods head to questions Abd: obese, wound is clean and packed, but tissue is pale. JP drain with serous fluid (550cc yesterday), NGT in place with no output currently, but a large dump after reinitiation of LIWS yesterday, but minimal since then.  Ileostomy is viable and very productive of thin brown watery output. 1L yesterday  Lab Results:  Recent Labs    12/06/22 0431 12/07/22 0301  WBC 7.8 4.0  HGB 9.5* 7.9*  HCT 31.1* 24.9*  PLT 168 119*   BMET Recent Labs    12/06/22 2008 12/07/22 0301  NA 139 139  K 3.5 3.0*  CL 96* 96*  CO2 28 28  GLUCOSE 123* 87  BUN 59* 59*  CREATININE 3.69* 3.47*  CALCIUM 7.5* 7.8*   PT/INR No results for input(s): "LABPROT", "INR" in the last 72 hours.  CMP     Component Value Date/Time   NA 139 12/07/2022 0301   NA 138 10/22/2016 0000   K 3.0 (L) 12/07/2022 0301   CL 96 (L) 12/07/2022 0301   CO2 28 12/07/2022 0301   GLUCOSE 87 12/07/2022 0301   BUN 59 (H) 12/07/2022 0301   BUN 26 (A) 10/22/2016 0000   CREATININE 3.47 (H) 12/07/2022 0301   CREATININE 1.95 (H) 02/24/2020 0938    CREATININE 2.67 (H) 10/02/2016 1651   CALCIUM 7.8 (L) 12/07/2022 0301   PROT 5.0 (L) 12/06/2022 0900   ALBUMIN <1.5 (L) 12/06/2022 0900   AST 43 (H) 12/06/2022 0900   AST 8 (L) 02/24/2020 0938   ALT 21 12/06/2022 0900   ALT 8 02/24/2020 0938   ALKPHOS 85 12/06/2022 0900   BILITOT 1.8 (H) 12/06/2022 0900   BILITOT 0.3 02/24/2020 0938   GFRNONAA 19 (L) 12/07/2022 0301   GFRNONAA 36 (L) 02/24/2020 0938   GFRNONAA 25 (L) 10/02/2016 1651   GFRAA 42 (L) 02/24/2020 0938   GFRAA 29 (L) 10/02/2016 1651   Lipase     Component Value Date/Time   LIPASE 21 12/03/2022 1654       Studies/Results: ECHOCARDIOGRAM LIMITED  Result Date: 12/06/2022    ECHOCARDIOGRAM LIMITED REPORT   Patient Name:   Gregory [name] Date of Exam: 12/06/2022 Medical Rec #:  [id number]    Height:       72.0 in Accession #:    [id number]   Weight:       236.1 lb Date of Birth:  05/18/1961   BSA:          2.286 m Patient Age:    62 years     BP:           112/71 mmHg Patient Gender: M  HR:           79 bpm. Exam Location:  Inpatient Procedure: Limited Echo, Cardiac Doppler, Limited Color Doppler and Intracardiac            Opacification Agent Indications:    Dilated cardiomyopathy I42.0  History:        Patient has prior history of Echocardiogram examinations, most                 recent 09/19/2022. CHF, Sepsis and COPD; Risk                 Factors:Hypertension, Dyslipidemia, Diabetes and Current Smoker.  Sonographer:    Aron Baba Referring Phys: [id number] Tobey Grim  Sonographer Comments: Echo performed with patient supine and on artificial respirator and Technically difficult study due to poor echo windows. Image acquisition challenging due to respiratory motion. IMPRESSIONS  1. Endocardial borders challenging . Left ventricular ejection fraction, by estimation, is 50 to 55%. The left ventricle has low normal function.  2. Right ventricular systolic function is normal. The right ventricular size is not well  visualized. Tricuspid regurgitation signal is inadequate for assessing PA pressure.  3. No evidence of mitral valve regurgitation.  4. Aortic valve regurgitation is mild.  5. On a ventilator. The inferior vena cava is dilated in size with <50% respiratory variability, suggesting right atrial pressure of 15 mmHg. Conclusion(s)/Recommendation(s): Overall EF appears prserved. valves not well visualized. FINDINGS  Left Ventricle: Endocardial borders challenging. Left ventricular ejection fraction, by estimation, is 50 to 55%. The left ventricle has low normal function. Definity contrast agent was given IV to delineate the left ventricular endocardial borders. There is no left ventricular hypertrophy. Right Ventricle: The right ventricular size is not well visualized. Right ventricular systolic function is normal. Tricuspid regurgitation signal is inadequate for assessing PA pressure. Left Atrium: Left atrial size was normal in size. Right Atrium: Right atrial size was normal in size. Tricuspid Valve: Tricuspid valve regurgitation is not demonstrated. Aortic Valve: Aortic valve regurgitation is mild. Venous: On a ventilator. The inferior vena cava is dilated in size with less than 50% respiratory variability, suggesting right atrial pressure of 15 mmHg. Additional Comments: Spectral Doppler performed. Color Doppler performed.  LEFT VENTRICLE PLAX 2D LVIDd:         5.00 cm LVIDs:         3.70 cm LV PW:         1.10 cm LV IVS:        1.00 cm  RIGHT VENTRICLE TAPSE (M-mode): 1.3 cm LEFT ATRIUM         Index LA diam:    3.70 cm 1.62 cm/m  MITRAL VALVE MV Area (PHT): 4.06 cm MV Decel Time: 187 msec MV E velocity: 66.80 cm/s MV A velocity: 88.70 cm/s MV E/A ratio:  0.75 Halford Decamp signed by Carolan Clines Signature Date/Time: 12/06/2022/11:17:24 AM    Final    CT HEAD WO CONTRAST ( )  Result Date: 12/05/2022 CLINICAL DATA:  Altered mental status. EXAM: CT HEAD WITHOUT CONTRAST TECHNIQUE: Contiguous axial  images were obtained from the base of the skull through the vertex without intravenous contrast. RADIATION DOSE REDUCTION: This exam was performed according to the departmental dose-optimization program which includes automated exposure control, adjustment of the mA and/or kV according to patient size and/or use of iterative reconstruction technique. COMPARISON:  Head CT dated 09/18/2022. FINDINGS: Brain: There is mild age-related atrophy and chronic microvascular ischemic changes. Evolution of right occipital  infarct. There is no acute intracranial hemorrhage. No mass effect or midline shift. No extra-axial fluid collection. Vascular: No hyperdense vessel or unexpected calcification. Skull: Normal. Negative for fracture or focal lesion. Sinuses/Orbits: The visualized paranasal sinuses and mastoid air cells are clear. Other: An endotracheal and an enteric tube are partially visualized. Multiple surgical clips in the soft tissues of the right face as seen previously. IMPRESSION: 1. No acute intracranial hemorrhage. 2. Mild age-related atrophy and chronic microvascular ischemic changes. 3. Evolution of right occipital infarct. Electronically Signed   By: Elgie Collard M.D.   On: 12/05/2022 23:56   Portable Chest x-ray  Result Date: 12/05/2022 CLINICAL DATA:  Intubated EXAM: PORTABLE CHEST 1 VIEW COMPARISON:  12/03/2022, CT 12/03/2022 FINDINGS: Endotracheal tube tip about 4.5 cm superior to the carina. Esophageal tube tip below the diaphragm but incompletely visualized. Cardiomegaly. Interim development of heterogeneous bilateral airspace disease. Right IJ central venous catheter tip over the SVC IMPRESSION: 1. Interim development of heterogeneous bilateral airspace disease, edema versus diffuse pneumonia. 2. Cardiomegaly. 3. Interim placement of support lines and tubes as above. Endotracheal tube tip about 4.5 cm superior to carina Electronically Signed   By: Jasmine Pang M.D.   On: 12/05/2022 23:00   DG Abd  Portable 1V  Result Date: 12/05/2022 CLINICAL DATA:  Check gastric catheter placement EXAM: PORTABLE ABDOMEN - 1 VIEW COMPARISON:  Film from earlier in the same day. FINDINGS: Gastric catheter is noted in the more distal stomach. Multiple ingested tablets are again seen. No free air is noted. IMPRESSION: Gastric catheter within the stomach. Electronically Signed   By: Alcide Clever M.D.   On: 12/05/2022 21:18    Anti-infectives: Anti-infectives (From admission, onward)    Start     Dose/Rate Route Frequency Ordered Stop   12/05/22 1800  vancomycin (VANCOREADY) IVPB 1250 mg/250 mL        1,250 mg 166.7 mL/hr over 90 Minutes Intravenous Every 48 hours 12/03/22 2346     12/05/22 1300  meropenem (MERREM) 1 g in sodium chloride 0.9 % 100 mL IVPB        1 g 200 mL/hr over 30 Minutes Intravenous Every 12 hours 12/05/22 1201     12/04/22 0100  piperacillin-tazobactam (ZOSYN) IVPB 3.375 g  Status:  Discontinued        3.375 g 12.5 mL/hr over 240 Minutes Intravenous Every 8 hours 12/03/22 2346 12/05/22 1159   12/03/22 1700  vancomycin (VANCOREADY) IVPB 2000 mg/400 mL        2,000 mg 200 mL/hr over 120 Minutes Intravenous  Once 12/03/22 1657 12/03/22 2020   12/03/22 1700  piperacillin-tazobactam (ZOSYN) IVPB 3.375 g        3.375 g 100 mL/hr over 30 Minutes Intravenous  Once 12/03/22 1657 12/03/22 1804        Assessment/Plan POD 3, 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 Sheliah Hatch 12/04/22 -NS WD dressing changes to midline wound BID -cont abx therapy, Merrem/vanc now due to ESBL in urine culture -NGT clamped today.  Will hold on TFs today while attempting possible extubation.  D/w Dr. Francine Graven. -cont JP drain, not surprised by large SS output, will start to slow down over time. -WBC normal -hgb down today to 7.9 from 9.5 yesterday and plts down as well.  No overt evidence of bleeding.  Continue to monitor -scheduled Tylenol and Robaxin to help with non-narcotic pain  control.  Ok to resume meds down tube and to restart home oxy to help  with longer-acting pain control -fevers resolved since 1700 yesterday -WOC following for new ileostomy -hold any stool softeners for now.  Will likely end up need fiber/imodium with ileostomy eventually.  Will monitor this need.     FEN - NGT clamp/IVFs per primary VTE - heparin, SQ, on hold today ID - vanc/Merrem  Septic shock - back on pressors, but may be sedation related VDRF - per CCM MMP - per CCM   LOS: 4 days    Letha Cape , [facility] 12/07/2022, 10:08 AM Please see Amion for pager number during day hours 7:00am-4:30pm or 7:00am -11:30am on weekends

## 2022-12-07 NOTE — Progress Notes (Signed)
eLink Physician-Brief Progress Note Patient Name: Gregory [name] DOB: [birth year] MRN: [id number]   Date of Service  12/07/2022  HPI/Events of Note  Needs restraints order renewed, needs K+ repleted, BS 80 mg / dl.  eICU Interventions  Restraints order renewed. KCL 10 meq iv Q 1 hour x 3, D 5 % water gtt ordered to run at 60 ml / hour x 24 hours.        Thomasene Lot Oluwatosin Bracy 12/07/2022, 5:27 AM

## 2022-12-08 DIAGNOSIS — N179 Acute kidney failure, unspecified: Secondary | ICD-10-CM | POA: Diagnosis not present

## 2022-12-08 DIAGNOSIS — A419 Sepsis, unspecified organism: Secondary | ICD-10-CM | POA: Diagnosis not present

## 2022-12-08 DIAGNOSIS — R6521 Severe sepsis with septic shock: Secondary | ICD-10-CM | POA: Diagnosis not present

## 2022-12-08 LAB — BASIC METABOLIC PANEL
Anion gap: 14 (ref 5–15)
BUN: 54 mg/dL — ABNORMAL HIGH (ref 8–23)
CO2: 27 mmol/L (ref 22–32)
Calcium: 8 mg/dL — ABNORMAL LOW (ref 8.9–10.3)
Chloride: 98 mmol/L (ref 98–111)
Creatinine, Ser: 2.88 mg/dL — ABNORMAL HIGH (ref 0.61–1.24)
GFR, Estimated: 24 mL/min — ABNORMAL LOW (ref >60)
Glucose, Bld: 120 mg/dL — ABNORMAL HIGH (ref 70–99)
Potassium: 3.1 mmol/L — ABNORMAL LOW (ref 3.5–5.1)
Sodium: 139 mmol/L (ref 135–145)

## 2022-12-08 LAB — POCT I-STAT 7, (LYTES, BLD GAS, ICA,H+H)
Acid-Base Excess: 4 mmol/L — ABNORMAL HIGH (ref 0.0–2.0)
Bicarbonate: 28.2 mmol/L — ABNORMAL HIGH (ref 20.0–28.0)
Calcium, Ion: 1.11 mmol/L — ABNORMAL LOW (ref 1.15–1.40)
HCT: 27 % — ABNORMAL LOW (ref 39.0–52.0)
Hemoglobin: 9.2 g/dL — ABNORMAL LOW (ref 13.0–17.0)
O2 Saturation: 96 %
Patient temperature: 37.5
Potassium: 3.7 mmol/L (ref 3.5–5.1)
Sodium: 139 mmol/L (ref 135–145)
TCO2: 29 mmol/L (ref 22–32)
pCO2 arterial: 43 mmHg (ref 32–48)
pH, Arterial: 7.426 (ref 7.35–7.45)
pO2, Arterial: 80 mmHg — ABNORMAL LOW (ref 83–108)

## 2022-12-08 LAB — CBC
HCT: 28.5 % — ABNORMAL LOW (ref 39.0–52.0)
Hemoglobin: 8.8 g/dL — ABNORMAL LOW (ref 13.0–17.0)
MCH: 27.2 pg (ref 26.0–34.0)
MCHC: 30.9 g/dL (ref 30.0–36.0)
MCV: 88 fL (ref 80.0–100.0)
Platelets: 122 10*3/uL — ABNORMAL LOW (ref 150–400)
RBC: 3.24 MIL/uL — ABNORMAL LOW (ref 4.22–5.81)
RDW: 15.9 % — ABNORMAL HIGH (ref 11.5–15.5)
WBC: 7.8 10*3/uL (ref 4.0–10.5)
nRBC: 0 % (ref 0.0–0.2)

## 2022-12-08 LAB — TYPE AND SCREEN
Unit division: 0
Unit division: 0

## 2022-12-08 LAB — CULTURE, BLOOD (ROUTINE X 2): Special Requests: ADEQUATE

## 2022-12-08 LAB — PHOSPHORUS: Phosphorus: 5.8 mg/dL — ABNORMAL HIGH (ref 2.5–4.6)

## 2022-12-08 LAB — GLUCOSE, CAPILLARY
Glucose-Capillary: 130 mg/dL — ABNORMAL HIGH (ref 70–99)
Glucose-Capillary: 84 mg/dL (ref 70–99)
Glucose-Capillary: 72 mg/dL (ref 70–99)
Glucose-Capillary: 133 mg/dL — ABNORMAL HIGH (ref 70–99)
Glucose-Capillary: 126 mg/dL — ABNORMAL HIGH (ref 70–99)
Glucose-Capillary: 92 mg/dL (ref 70–99)

## 2022-12-08 LAB — BPAM RBC
Blood Product Expiration Date: 202407162359
Blood Product Expiration Date: 202407162359

## 2022-12-08 LAB — CALCIUM, IONIZED: Calcium, Ionized, Serum: 4.1 mg/dL — ABNORMAL LOW (ref 4.5–5.6)

## 2022-12-08 LAB — MAGNESIUM: Magnesium: 2.6 mg/dL — ABNORMAL HIGH (ref 1.7–2.4)

## 2022-12-08 LAB — POTASSIUM: Potassium: 3.5 mmol/L (ref 3.5–5.1)

## 2022-12-08 MED ORDER — AMIODARONE LOAD VIA INFUSION
150.0000 mg | Freq: Once | INTRAVENOUS | Status: AC
  Administered 2022-12-08: 150 mg via INTRAVENOUS
  Filled 2022-12-08: qty 83.34

## 2022-12-08 MED ORDER — HYDROMORPHONE HCL-NACL 50-0.9 MG/50ML-% IV SOLN
0.5000 mg/h | INTRAVENOUS | Status: DC
  Administered 2022-12-08: 2 mg/h via INTRAVENOUS
  Administered 2022-12-08: 1 mg/h via INTRAVENOUS
  Administered 2022-12-09: 4 mg/h via INTRAVENOUS
  Administered 2022-12-09: 1 mg/h via INTRAVENOUS
  Administered 2022-12-10: 3 mg/h via INTRAVENOUS
  Administered 2022-12-10: 4 mg/h via INTRAVENOUS
  Administered 2022-12-11: 3 mg/h via INTRAVENOUS
  Administered 2022-12-11: 4 mg/h via INTRAVENOUS
  Administered 2022-12-12 – 2022-12-13 (×3): 2 mg/h via INTRAVENOUS
  Filled 2022-12-08 (×11): qty 50

## 2022-12-08 MED ORDER — PHENYLEPHRINE HCL-NACL 20-0.9 MG/250ML-% IV SOLN
0.0000 ug/min | INTRAVENOUS | Status: DC
  Administered 2022-12-08: 150 ug/min via INTRAVENOUS
  Administered 2022-12-08 (×2): 200 ug/min via INTRAVENOUS
  Administered 2022-12-08: 150 ug/min via INTRAVENOUS
  Administered 2022-12-09 (×2): 20 ug/min via INTRAVENOUS
  Administered 2022-12-09: 120 ug/min via INTRAVENOUS
  Administered 2022-12-09: 90 ug/min via INTRAVENOUS
  Administered 2022-12-10: 130 ug/min via INTRAVENOUS
  Administered 2022-12-10: 120 ug/min via INTRAVENOUS
  Filled 2022-12-08 (×10): qty 250

## 2022-12-08 MED ORDER — LOPERAMIDE HCL 2 MG PO CAPS: 2.0000 mg | ORAL_CAPSULE | Freq: Two times a day (BID) | ORAL | Status: DC

## 2022-12-08 MED ORDER — DOCUSATE SODIUM 50 MG/5ML PO LIQD: 100.0000 mg | Freq: Two times a day (BID) | ORAL | Status: DC

## 2022-12-08 MED ORDER — POLYETHYLENE GLYCOL 3350 17 G PO PACK: 17.0000 g | PACK | Freq: Every day | ORAL | Status: DC

## 2022-12-08 MED ORDER — CHLORHEXIDINE GLUCONATE CLOTH 2 % EX PADS
6.0000 | MEDICATED_PAD | CUTANEOUS | Status: DC
  Administered 2022-12-08 – 2022-12-24 (×14): 6 via TOPICAL

## 2022-12-08 MED ORDER — POTASSIUM CHLORIDE 10 MEQ/50ML IV SOLN
10.0000 meq | INTRAVENOUS | Status: AC
  Administered 2022-12-08 (×4): 10 meq via INTRAVENOUS
  Filled 2022-12-08 (×4): qty 50

## 2022-12-08 MED ORDER — HYDROMORPHONE BOLUS VIA INFUSION
0.2500 mg | INTRAVENOUS | Status: DC | PRN
  Administered 2022-12-08: 0.5 mg via INTRAVENOUS
  Administered 2022-12-08: 2 mg via INTRAVENOUS
  Administered 2022-12-08: 0.5 mg via INTRAVENOUS
  Administered 2022-12-08: 2 mg via INTRAVENOUS
  Administered 2022-12-08: 1 mg via INTRAVENOUS
  Administered 2022-12-09 – 2022-12-12 (×19): 2 mg via INTRAVENOUS

## 2022-12-08 MED ORDER — VITAL 1.5 CAL PO LIQD
1000.0000 mL | ORAL | Status: DC
  Administered 2022-12-08: 1000 mL

## 2022-12-08 MED ORDER — HYDROMORPHONE HCL 1 MG/ML IJ SOLN
1.0000 mg | Freq: Once | INTRAMUSCULAR | Status: AC
  Administered 2022-12-08: 1 mg via INTRAVENOUS
  Filled 2022-12-08: qty 1

## 2022-12-08 MED ORDER — GABAPENTIN 250 MG/5ML PO SOLN
100.0000 mg | Freq: Three times a day (TID) | ORAL | Status: DC
  Administered 2022-12-08 – 2022-12-10 (×5): 100 mg
  Filled 2022-12-08 (×6): qty 2

## 2022-12-08 MED ORDER — VITAL HIGH PROTEIN PO LIQD
1000.0000 mL | ORAL | Status: DC
Start: 2022-12-08 — End: 2022-12-08

## 2022-12-08 MED ORDER — HEPARIN SODIUM (PORCINE) 5000 UNIT/ML IJ SOLN
5000.0000 [IU] | Freq: Three times a day (TID) | INTRAMUSCULAR | Status: DC
  Administered 2022-12-08 – 2022-12-25 (×52): 5000 [IU] via SUBCUTANEOUS
  Filled 2022-12-08 (×51): qty 1

## 2022-12-08 MED ORDER — PHENYLEPHRINE HCL-NACL 20-0.9 MG/250ML-% IV SOLN
INTRAVENOUS | Status: AC
  Administered 2022-12-08: 100 ug/min via INTRAVENOUS
  Filled 2022-12-08: qty 250

## 2022-12-08 MED ORDER — OXYCODONE HCL 5 MG PO TABS
20.0000 mg | ORAL_TABLET | Freq: Four times a day (QID) | ORAL | Status: DC | PRN
  Administered 2022-12-08 – 2022-12-10 (×6): 20 mg
  Filled 2022-12-08 (×7): qty 4

## 2022-12-08 MED ORDER — MIDODRINE HCL 5 MG PO TABS
10.0000 mg | ORAL_TABLET | Freq: Three times a day (TID) | ORAL | Status: DC
  Administered 2022-12-08 – 2022-12-11 (×10): 10 mg
  Filled 2022-12-08 (×10): qty 2

## 2022-12-08 MED ORDER — LOPERAMIDE HCL 1 MG/7.5ML PO SUSP
2.0000 mg | Freq: Two times a day (BID) | ORAL | Status: DC
  Administered 2022-12-08 (×2): 2 mg
  Filled 2022-12-08 (×2): qty 15

## 2022-12-08 MED ORDER — NUTRISOURCE FIBER PO PACK
1.0000 | PACK | Freq: Two times a day (BID) | ORAL | Status: DC
  Administered 2022-12-08 – 2022-12-11 (×8): 1
  Filled 2022-12-08 (×9): qty 1

## 2022-12-08 MED ORDER — LACTATED RINGERS IV BOLUS
500.0000 mL | Freq: Once | INTRAVENOUS | Status: AC
  Administered 2022-12-08: 500 mL via INTRAVENOUS

## 2022-12-08 NOTE — Progress Notes (Signed)
eLink Physician-Brief Progress Note Patient Name: KYRAN CONNAUGHTON DOB: [birth year] MRN: [id number]   Date of Service  12/08/2022  HPI/Events of Note  Received query regarding rechecking electrolytes tonight.  Pt with atrial fibrillation on amiodarone.   eICU Interventions  Recheck K and calcium.       Intervention Category Intermediate Interventions: Electrolyte abnormality - evaluation and management  Larinda Buttery 12/08/2022, 8:01 PM

## 2022-12-08 NOTE — Progress Notes (Signed)
5 Days Post-Op  Subjective: Biggest issue is pain control, somewhat limiting extubation.  Off pressors still.  Fevers has resolved.  Having significant output from ileostomy.   Objective: Vital signs in last 24 hours: Temp:  [97.3 F (36.3 C)-99.9 F (37.7 C)] 98.4 F (36.9 C) (06/22 0718) Pulse Rate:  [76-125] 107 (06/22 0723) Resp:  [10-32] 20 (06/22 0715) BP: (94-120)/(50-73) 108/66 (06/22 0400) SpO2:  [94 %-100 %] 100 % (06/22 0715) Arterial Line BP: (87-194)/(43-64) 109/55 (06/22 0715) FiO2 (%):  [40 %] 40 % (06/22 0727) Weight:  [102.2 kg] 102.2 kg (06/22 0149) Last BM Date : 12/08/22  Intake/Output from previous day: 06/21 0701 - 06/22 0700 In: 3865.6 [I.V.:2832.4; NG/GT:360; IV Piggyback:673.3] Out: 4800 [Urine:1940; Drains:370; Stool:2490] Intake/Output this shift: No intake/output data recorded.  PE: Gen: awake and nods head to questions Abd: obese, wound is clean and packed, but tissue is pale. JP drain with serous fluid (340cc yesterday), NGT in place with no output currently. Ileostomy is viable and very productive of succus.  2.5L yesterday  Lab Results:  Recent Labs    12/07/22 0301 12/08/22 0432  WBC 4.0 7.8  HGB 7.9* 8.8*  HCT 24.9* 28.5*  PLT 119* 122*   BMET Recent Labs    12/07/22 2042 12/08/22 0432  NA 138 139  K 3.0* 3.1*  CL 98 98  CO2 28 27  GLUCOSE 96 120*  BUN 54* 54*  CREATININE 3.19* 2.88*  CALCIUM 7.9* 8.0*   PT/INR No results for input(s): "LABPROT", "INR" in the last 72 hours.  CMP     Component Value Date/Time   NA 139 12/08/2022 0432   NA 138 10/22/2016 0000   K 3.1 (L) 12/08/2022 0432   CL 98 12/08/2022 0432   CO2 27 12/08/2022 0432   GLUCOSE 120 (H) 12/08/2022 0432   BUN 54 (H) 12/08/2022 0432   BUN 26 (A) 10/22/2016 0000   CREATININE 2.88 (H) 12/08/2022 0432   CREATININE 1.95 (H) 02/24/2020 0938   CREATININE 2.67 (H) 10/02/2016 1651   CALCIUM 8.0 (L) 12/08/2022 0432   PROT 5.3 (L) 12/07/2022 1200    ALBUMIN 2.3 (L) 12/07/2022 1200   AST 35 12/07/2022 1200   AST 8 (L) 02/24/2020 0938   ALT 20 12/07/2022 1200   ALT 8 02/24/2020 0938   ALKPHOS 77 12/07/2022 1200   BILITOT 1.6 (H) 12/07/2022 1200   BILITOT 0.3 02/24/2020 0938   GFRNONAA 24 (L) 12/08/2022 0432   GFRNONAA 36 (L) 02/24/2020 0938   GFRNONAA 25 (L) 10/02/2016 1651   GFRAA 42 (L) 02/24/2020 0938   GFRAA 29 (L) 10/02/2016 1651   Lipase     Component Value Date/Time   LIPASE 21 12/03/2022 1654       Studies/Results: ECHOCARDIOGRAM LIMITED  Result Date: 12/06/2022    ECHOCARDIOGRAM LIMITED REPORT   Patient Name:   Gregory [name] Date of Exam: 12/06/2022 Medical Rec #:  [id number]    Height:       72.0 in Accession #:    [id number]   Weight:       236.1 lb Date of Birth:  07-08-1960   BSA:          2.286 m Patient Age:    62 years     BP:           112/71 mmHg Patient Gender: M            HR:  79 bpm. Exam Location:  Inpatient Procedure: Limited Echo, Cardiac Doppler, Limited Color Doppler and Intracardiac            Opacification Agent Indications:    Dilated cardiomyopathy I42.0  History:        Patient has prior history of Echocardiogram examinations, most                 recent 09/19/2022. CHF, Sepsis and COPD; Risk                 Factors:Hypertension, Dyslipidemia, Diabetes and Current Smoker.  Sonographer:    Aron Baba Referring Phys: [id number] Tobey Grim  Sonographer Comments: Echo performed with patient supine and on artificial respirator and Technically difficult study due to poor echo windows. Image acquisition challenging due to respiratory motion. IMPRESSIONS  1. Endocardial borders challenging . Left ventricular ejection fraction, by estimation, is 50 to 55%. The left ventricle has low normal function.  2. Right ventricular systolic function is normal. The right ventricular size is not well visualized. Tricuspid regurgitation signal is inadequate for assessing PA pressure.  3. No evidence of mitral valve  regurgitation.  4. Aortic valve regurgitation is mild.  5. On a ventilator. The inferior vena cava is dilated in size with <50% respiratory variability, suggesting right atrial pressure of 15 mmHg. Conclusion(s)/Recommendation(s): Overall EF appears prserved. valves not well visualized. FINDINGS  Left Ventricle: Endocardial borders challenging. Left ventricular ejection fraction, by estimation, is 50 to 55%. The left ventricle has low normal function. Definity contrast agent was given IV to delineate the left ventricular endocardial borders. There is no left ventricular hypertrophy. Right Ventricle: The right ventricular size is not well visualized. Right ventricular systolic function is normal. Tricuspid regurgitation signal is inadequate for assessing PA pressure. Left Atrium: Left atrial size was normal in size. Right Atrium: Right atrial size was normal in size. Tricuspid Valve: Tricuspid valve regurgitation is not demonstrated. Aortic Valve: Aortic valve regurgitation is mild. Venous: On a ventilator. The inferior vena cava is dilated in size with less than 50% respiratory variability, suggesting right atrial pressure of 15 mmHg. Additional Comments: Spectral Doppler performed. Color Doppler performed.  LEFT VENTRICLE PLAX 2D LVIDd:         5.00 cm LVIDs:         3.70 cm LV PW:         1.10 cm LV IVS:        1.00 cm  RIGHT VENTRICLE TAPSE (M-mode): 1.3 cm LEFT ATRIUM         Index LA diam:    3.70 cm 1.62 cm/m  MITRAL VALVE MV Area (PHT): 4.06 cm MV Decel Time: 187 msec MV E velocity: 66.80 cm/s MV A velocity: 88.70 cm/s MV E/A ratio:  0.75 Halford Decamp signed by Carolan Clines Signature Date/Time: 12/06/2022/11:17:24 AM    Final     Anti-infectives: Anti-infectives (From admission, onward)    Start     Dose/Rate Route Frequency Ordered Stop   12/05/22 1800  vancomycin (VANCOREADY) IVPB 1250 mg/250 mL        1,250 mg 166.7 mL/hr over 90 Minutes Intravenous Every 48 hours 12/03/22 2346      12/05/22 1300  meropenem (MERREM) 1 g in sodium chloride 0.9 % 100 mL IVPB        1 g 200 mL/hr over 30 Minutes Intravenous Every 12 hours 12/05/22 1201     12/04/22 0100  piperacillin-tazobactam (ZOSYN) IVPB 3.375 g  Status:  Discontinued  3.375 g 12.5 mL/hr over 240 Minutes Intravenous Every 8 hours 12/03/22 2346 12/05/22 1159   12/03/22 1700  vancomycin (VANCOREADY) IVPB 2000 mg/400 mL        2,000 mg 200 mL/hr over 120 Minutes Intravenous  Once 12/03/22 1657 12/03/22 2020   12/03/22 1700  piperacillin-tazobactam (ZOSYN) IVPB 3.375 g        3.375 g 100 mL/hr over 30 Minutes Intravenous  Once 12/03/22 1657 12/03/22 1804        Assessment/Plan POD 4, 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 Sheliah Hatch 12/04/22 -NS WD dressing changes to midline wound BID -cont abx therapy, Merrem/vanc now due to ESBL in urine culture -may use NGT for TFs as able per CCM and plans for extubation.  D/w CCM -cont JP drain,output likely secondary to some ascites.  serous -WBC normal -hgb up to 8.8.  may resume DVT prophylaxis from surgical stnadpoint -scheduled Tylenol and Robaxin to help with non-narcotic pain control.  Ok to resume meds down tube and to restart home oxy to help with longer-acting pain control -fevers resolved  -WOC following for new ileostomy -hold any stool softeners for now.  Will add fiber and imodium BID for now and see how his output does.  May need to increase more to help control this.  2.5L yesterday    FEN - NGT clamp/IVFs per primary VTE - heparin, SQ, on hold today, but may resume ID - vanc/Merrem  Septic shock - back on pressors, but may be sedation related VDRF - per CCM MMP - per CCM   LOS: 5 days    Letha Cape , [facility] 12/08/2022, 8:57 AM Please see Amion for pager number during day hours 7:00am-4:30pm or 7:00am -11:30am on weekends

## 2022-12-08 NOTE — Progress Notes (Signed)
eLink Physician-Brief Progress Note Patient Name: Gregory [name] DOB: May 25, 1961 MRN: [id number]   Date of Service  12/08/2022  HPI/Events of Note  K+ 3.1, Cr 2.88.  eICU Interventions  KCL 10 meq iv Q 1 hour x 4 ordered.     Intervention Category Major Interventions: Electrolyte abnormality - evaluation and management  Migdalia Dk 12/08/2022, 5:26 AM

## 2022-12-08 NOTE — Progress Notes (Signed)
NAME:  Gregory [name], MRN:  [id number], DOB:  [birth year], LOS: 5 ADMISSION DATE:  12/03/2022, CONSULTATION DATE:  12/08/2022 REFERRING MD:  EDP, CHIEF COMPLAINT:  abdominal pain   History of Present Illness:  Gregory [name] is a 62 y.o. man with COPD on home oxygen and DM2 s/p RLE AKA who is a SNF resident at [facility] with frequent admissions to the hospital.   6/17 Presents to ER with abdominal pain, urinary incontinence, and hypoxia requiring 4L [place]. Lactic Acid 3, given fluid bolus and repeat lactic acid 4. CT with concerns of ileus with dilation of large intestine with stool balls in distal colon. Surgery consulted given rising lactic acid and concern for mesenteric ischemia. Plan to given enema to clear stool burden with frequent serial examinations. Admitted with critical care. WBC 28.7. Started on Vancomycin/Zosyn.   6/18 with worsening lactic acidosis and physical examination, decision made to proceed to OR for exploration. 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 Post-operative setting presented to ICU intubated with hypotension requiring NEO, EPI, Levophed, and vasopressin, with bicarb gtt. 6/19 vasopressor requirements improvement. Patient extubated.   Pertinent  Medical History  S/p RLE AKA DM2 COPD on home oxygen  Significant Hospital Events: Including procedures, antibiotic start and stop dates in addition to other pertinent events   6/17 admitted, s/p exploratory laparotomy for total colectomy due to ischemic colon 6/17 urine>> EColi>> 6/19 > extubated. A.Fib RVR - re intubated that night 6/22 developed A-fib RVR overnight resulting in need for amiodarone drip and reinitiation of Levophed.    Interim History / Subjective:  Will awaken to verbal stimuli, able to follow commands High output ileostomy with 2.4 L stool output last 24 hours  Objective   Blood pressure 97/65, pulse (!) 119, temperature 98.8 F (37.1 C), resp. rate  (!) 25, weight 102.2 kg, SpO2 98 %.    Vent Mode: PRVC FiO2 (%):  [40 %] 40 % Set Rate:  [20 bmp] 20 bmp Vt Set:  [409 mL] 620 mL PEEP:  [5 cmH20] 5 cmH20 Pressure Support:  [5 cmH20] 5 cmH20 Plateau Pressure:  [21 cmH20] 21 cmH20   Intake/Output Summary (Last 24 hours) at 12/08/2022 1029 Last data filed at 12/08/2022 0600 Gross per 24 hour  Intake 3528.76 ml  Output 3220 ml  Net 308.76 ml    Filed Weights   12/06/22 0500 12/07/22 0301 12/08/22 0149  Weight: 107.1 kg 103.5 kg 102.2 kg   Examination: General: Acute on chronic ill-appearing elderly male lying in bed on mechanical ventilation no acute distress HEENT: ETT, MM pink/moist, PERRL,  Neuro: Alert and interactive, able to follow commands CV: s1s2 regular rate and rhythm, no murmur, rubs, or gallops,  PULM: Clear to auscultation bilaterally, no increased work of breathing, no added breath sounds GI: soft, bowel sounds active in all 4 quadrants, non-tender, non-distended, midline abdominal dressing clean dry and intact, ostomy Extremities: warm/dry, nonpitting edema  Skin: no rashes or lesions  Resolved Hospital Problem list   Anion Gap Metabolic Acidosis Lactic Acidosis  Mild Hyponatremia  Assessment & Plan:   Septic Shock in setting of: Ischemic colitis s/p total colectomy 6/17 E.Coli UTI, +/-sedation effect  P: Continue vancomycin and meropenem Continuous telemetry Increase midodrine to 10 mg 3 times daily Continue pressors for MAP goal greater than 65, wean as able Local wound care  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 Head of bed elevated 30 degrees. Plateau pressures less than 30 cm H20.  Follow intermittent chest x-ray and ABG.   Ensure adequate pulmonary hygiene  Follow cultures  VAP bundle in place  PAD protocol  A.Fib  -Weaned off amiodarone 6/20 as back in NSR, overnight 6/22 patient back in A-fib resulting in reinitiation of amiodarone drip -No heparin drip due to down trending H/H and platelets -Echo 6/20 with EF 50%, no significant valve issues PVD HTN HLD P: Continuous telemetry Continue amiodarone Resume home medications when appropriate  AKI on CKD Stage 3B Mild hypokalemia Hyperphosphatemia  P: Follow renal function Monitor  urine output Trend Bmet Avoid nephrotoxins Ensure adequate renal perfusion  Trend Bement Supplement as needed  Anemia, Acute blood loss > stable  P: Trend CBC Transfuse per protocol Hemoglobin goal greater than 7  Type 2 DM P:  Hold home medications SSI CBG goal 140-180  BPH P: Resume home Flomax when hemodynamics stabilize  Pain  Sedation  P: Changed IV fentanyl to IV Dilaudid per PAD protocol Continue home oxycodone, increased to every 6 hours Continue home Lexapro, Seroquel, and oxycodone Remains on IV Tylenol and Robaxin per surgery  Neuropathy P: Continue home gabapentin at reduced dose   Best Practice (right click and "Reselect all SmartList Selections" daily)   Diet/type: NPO w/ meds via tube DVT prophylaxis: not indicated GI prophylaxis: PPI Lines: Central line, Arterial Line, and yes and it is still needed Foley:  Yes, and it is still needed Code Status:  full code Last date of multidisciplinary goals of care discussion [ pending ]  Critical care time:    Performed by: Tane Biegler D. Harris  Total critical care time: 37 minutes  Critical care time was exclusive of  separately billable procedures and treating other patients.  Critical care was necessary to treat or prevent imminent or life-threatening deterioration.  Critical care was time spent personally by me on the following activities: development of treatment plan with patient and/or surrogate as well as nursing, discussions with consultants, evaluation of patient's response to treatment, examination of patient, obtaining history from patient or surrogate, ordering and performing treatments and interventions, ordering and review of laboratory studies, ordering and review of radiographic studies, pulse oximetry and re-evaluation of patient's condition.  Maleya Leever D. Harris, NP-C [facility] Personal contact information can be found on Amion  If no contact or response made please call [phone] 12/08/2022, 10:54 AM

## 2022-12-08 NOTE — Progress Notes (Signed)
Nutrition Follow-up  DOCUMENTATION CODES:   Obesity unspecified  INTERVENTION:   Initiate trickle tube feeds via NG tube: - Vital 1.5 @ 20 ml/hr (480 ml/day)    RD will monitor for ability to advance tube feeding to goal: - Vital 1.5 @ 65 ml/hr (1560 ml/day) - PROSource TF20 60 ml daily   Recommended tube feeding regimen at goal rate would provide 2420 kcal, 125 grams of protein, and 1192 ml of H2O.  NUTRITION DIAGNOSIS:   Increased nutrient needs related to wound healing, post-op healing as evidenced by estimated needs.  Ongoing, being addressed via initiation of enteral nutrition  GOAL:   Patient will meet greater than or equal to 90% of their needs  Unmet with only trickle tube feeds  MONITOR:   Vent status, Labs, Weight trends, TF tolerance, Skin, I & O's  REASON FOR ASSESSMENT:   Consult Enteral/tube feeding initiation and management (trickle tube feeds only, no advancement)  ASSESSMENT:   Pt with hx of DM type 2, COPD, HTN, HLD, CHF, GERD, hx CVA, and osteomyelitis s/p Right AKA presented to ED from SNF with severe abdominal pain. Workup in ED worrisome for bowel ischemia.  06/17 - admitted 06/18 - s/p exploratory laparotomy, total abdominal colectomy with end ileostomy, returned to ICU intubated 06/19 - extubated, trickle tube feeds started via NG tube, later reintubated 06/20 - TF held due to increased pressor requirements and overall worsening status, NG tube to LIWS  RD working remotely.  Received consult for initiation of trickle tube feeds. [name] ileostomy with high output over the last 24 hours. Surgery has ordered imodium and fiber.  Pt with mild pitting generalized edema and deep pitting edema to LLE.  Admit weight: 106.6 kg Current weight: 102.2 kg  Patient is currently intubated on ventilator support Temp (24hrs), Avg:98.6 F (37 C), Min:97.3 F (36.3 C), Max:99.9 F (37.7 C)  Drips: Amiodarone Dilaudid Neosynephrine: 70  mcg/min  Medications reviewed and include: Nutrisource Fiber 1 packet BID, SSI q 4 hours, semglee 4 units daily, imodium 2 mg BID, IV protonix, IV abx  Labs reviewed: potassium 3.1 (pt received IV replacement), BUN 54, creatinine 2.88, phosphorus 5.8, magnesium 2.6, TG 314, hemoglobin 8.8, platelets 122 CBG's: 72-130 x 24 hours  UOP: 1940 ml x 24 hours LUQ JP drain: 370 ml x 24 hours Ileostomy: 2490 ml x 24 hours I/O's: +3.5 L since admit  Diet Order:   Diet Order             Diet NPO time specified  Diet effective now                   EDUCATION NEEDS:   Not appropriate for education at this time  Skin:  Skin Assessment: Skin Integrity Issues: Stage II: R buttocks Stage III: L posterior thigh Incisions: abdomen Other: non-pressure wound (no location documented)  Last BM:  12/08/22 ileostomy (2490 ml x 24 hours)  Height:   Ht Readings from Last 1 Encounters:  11/27/22 6' (1.829 m)    Weight:   Wt Readings from Last 1 Encounters:  12/08/22 102.2 kg    Ideal Body Weight:  72.8 kg (adjusted by 10% for hx of R AKA)  BMI:  Body mass index is 30.56 kg/m.  Estimated Nutritional Needs:   Kcal:  2300-2500 kcal/d  Protein:  120-140g/d  Fluid:  2.2L/d    Mertie Clause, MS, RD, LDN Inpatient Clinical Dietitian Please see AMiON for contact information.

## 2022-12-08 NOTE — Progress Notes (Signed)
eLink Physician-Brief Progress Note Patient Name: Gregory [name] DOB: [birth year] MRN: [id number]   Date of Service  12/08/2022  HPI/Events of Note  Patient having fib-flutter runs with variable block. He is on Amiodarone gtt and on Levophed gtt.Most recent EF 50 - 55 % with normal RV function as well.  eICU Interventions  Will substitute Phenylephrine gtt for Levophed gtt  given cardiac irritability (plan is to start Neo then wean off and discontinue Levophed, this was discussed with bedside RN).        Thomasene Lot Remington Skalsky 12/08/2022, 2:25 AM

## 2022-12-09 DIAGNOSIS — R6521 Severe sepsis with septic shock: Secondary | ICD-10-CM | POA: Diagnosis not present

## 2022-12-09 DIAGNOSIS — A419 Sepsis, unspecified organism: Secondary | ICD-10-CM | POA: Diagnosis not present

## 2022-12-09 DIAGNOSIS — N179 Acute kidney failure, unspecified: Secondary | ICD-10-CM | POA: Diagnosis not present

## 2022-12-09 LAB — CBC
HCT: 28.3 % — ABNORMAL LOW (ref 39.0–52.0)
Hemoglobin: 8.6 g/dL — ABNORMAL LOW (ref 13.0–17.0)
MCH: 27 pg (ref 26.0–34.0)
MCHC: 30.4 g/dL (ref 30.0–36.0)
MCV: 88.7 fL (ref 80.0–100.0)
Platelets: 105 10*3/uL — ABNORMAL LOW (ref 150–400)
RBC: 3.19 MIL/uL — ABNORMAL LOW (ref 4.22–5.81)
RDW: 16 % — ABNORMAL HIGH (ref 11.5–15.5)
WBC: 6.6 10*3/uL (ref 4.0–10.5)
nRBC: 0 % (ref 0.0–0.2)

## 2022-12-09 LAB — GLUCOSE, CAPILLARY
Glucose-Capillary: 125 mg/dL — ABNORMAL HIGH (ref 70–99)
Glucose-Capillary: 109 mg/dL — ABNORMAL HIGH (ref 70–99)
Glucose-Capillary: 131 mg/dL — ABNORMAL HIGH (ref 70–99)
Glucose-Capillary: 157 mg/dL — ABNORMAL HIGH (ref 70–99)
Glucose-Capillary: 107 mg/dL — ABNORMAL HIGH (ref 70–99)
Glucose-Capillary: 125 mg/dL — ABNORMAL HIGH (ref 70–99)

## 2022-12-09 LAB — BASIC METABOLIC PANEL
Anion gap: 11 (ref 5–15)
BUN: 48 mg/dL — ABNORMAL HIGH (ref 8–23)
CO2: 26 mmol/L (ref 22–32)
Calcium: 7.9 mg/dL — ABNORMAL LOW (ref 8.9–10.3)
Chloride: 102 mmol/L (ref 98–111)
Creatinine, Ser: 2.55 mg/dL — ABNORMAL HIGH (ref 0.61–1.24)
GFR, Estimated: 28 mL/min — ABNORMAL LOW (ref >60)
Glucose, Bld: 121 mg/dL — ABNORMAL HIGH (ref 70–99)
Potassium: 3.5 mmol/L (ref 3.5–5.1)
Sodium: 139 mmol/L (ref 135–145)

## 2022-12-09 LAB — PHOSPHORUS: Phosphorus: 5.6 mg/dL — ABNORMAL HIGH (ref 2.5–4.6)

## 2022-12-09 LAB — CULTURE, BLOOD (ROUTINE X 2): Culture: NO GROWTH

## 2022-12-09 LAB — MAGNESIUM: Magnesium: 2.4 mg/dL (ref 1.7–2.4)

## 2022-12-09 MED ORDER — AMIODARONE LOAD VIA INFUSION
150.0000 mg | Freq: Once | INTRAVENOUS | Status: AC
  Administered 2022-12-09: 150 mg via INTRAVENOUS
  Filled 2022-12-09: qty 83.34

## 2022-12-09 MED ORDER — POTASSIUM CHLORIDE 10 MEQ/50ML IV SOLN
10.0000 meq | INTRAVENOUS | Status: AC
  Administered 2022-12-09 (×4): 10 meq via INTRAVENOUS
  Filled 2022-12-09 (×2): qty 50

## 2022-12-09 MED ORDER — AMIODARONE HCL 200 MG PO TABS
200.0000 mg | ORAL_TABLET | Freq: Every day | ORAL | Status: DC
  Administered 2022-12-09 – 2022-12-13 (×5): 200 mg
  Filled 2022-12-09 (×5): qty 1

## 2022-12-09 MED ORDER — LOPERAMIDE HCL 1 MG/7.5ML PO SUSP
2.0000 mg | Freq: Three times a day (TID) | ORAL | Status: DC
  Administered 2022-12-09 – 2022-12-11 (×9): 2 mg
  Filled 2022-12-09 (×10): qty 15

## 2022-12-09 MED ORDER — VITAL 1.5 CAL PO LIQD
1000.0000 mL | ORAL | Status: DC
  Administered 2022-12-09 – 2022-12-12 (×4): 1000 mL

## 2022-12-09 MED ORDER — KETAMINE HCL-SODIUM CHLORIDE 1000-0.9 MG/100ML-% IV SOLN
0.5000 mg/kg/h | INTRAVENOUS | Status: DC
  Administered 2022-12-09 – 2022-12-11 (×3): 0.5 mg/kg/h via INTRAVENOUS
  Filled 2022-12-09 (×3): qty 100

## 2022-12-09 MED ORDER — KETAMINE BOLUS VIA INFUSION
0.5000 mg/kg | Freq: Once | INTRAVENOUS | Status: DC
  Filled 2022-12-09: qty 55

## 2022-12-09 MED ORDER — KETAMINE HCL 50 MG/5ML IJ SOSY
50.0000 mg | PREFILLED_SYRINGE | Freq: Once | INTRAMUSCULAR | Status: AC
  Administered 2022-12-09: 50 mg via INTRAVENOUS
  Filled 2022-12-09: qty 5

## 2022-12-09 NOTE — Progress Notes (Signed)
Md made aware of pt in junctional rhythm

## 2022-12-09 NOTE — Progress Notes (Signed)
6 Days Post-Op  Subjective: Biggest issue is pain control. Ileostomy output down some today. JP output remains high but serous.    Objective: Vital signs in last 24 hours: Temp:  [98.4 F (36.9 C)-99.5 F (37.5 C)] 99.5 F (37.5 C) (06/23 0830) Pulse Rate:  [68-131] 77 (06/23 0830) Resp:  [14-44] 22 (06/23 0830) BP: (92-124)/(46-63) 124/46 (06/23 0808) SpO2:  [97 %-100 %] 97 % (06/23 0830) Arterial Line BP: (95-162)/(41-77) 142/57 (06/23 0830) FiO2 (%):  [40 %] 40 % (06/23 0808) Weight:  [101.4 kg] 101.4 kg (06/23 0320) Last BM Date : 12/09/22  Intake/Output from previous day: 06/22 0701 - 06/23 0700 In: 3770.7 [I.V.:2178.5; NG/GT:715; IV Piggyback:877.3] Out: 3400 [Urine:1725; Drains:385; Stool:1290] Intake/Output this shift: Total I/O In: -  Out: 120 [Urine:60; Drains:60]  PE: Gen: awake and nods head to questions Abd: obese, wound is clean and packed, tissue is pale. JP drain with serous fluid (385cc yesterday), NGT in place with no output currently. Ileostomy is viable and productive of stool.  1.3L yesterday  Lab Results:  Recent Labs    12/08/22 0432 12/08/22 1315 12/09/22 0048  WBC 7.8  --  6.6  HGB 8.8* 9.2* 8.6*  HCT 28.5* 27.0* 28.3*  PLT 122*  --  105*    BMET Recent Labs    12/08/22 0432 12/08/22 1315 12/08/22 2021 12/09/22 0048  NA 139 139  --  139  K 3.1* 3.7 3.5 3.5  CL 98  --   --  102  CO2 27  --   --  26  GLUCOSE 120*  --   --  121*  BUN 54*  --   --  48*  CREATININE 2.88*  --   --  2.55*  CALCIUM 8.0*  --   --  7.9*    PT/INR No results for input(s): "LABPROT", "INR" in the last 72 hours.  CMP     Component Value Date/Time   NA 139 12/09/2022 0048   NA 138 10/22/2016 0000   K 3.5 12/09/2022 0048   CL 102 12/09/2022 0048   CO2 26 12/09/2022 0048   GLUCOSE 121 (H) 12/09/2022 0048   BUN 48 (H) 12/09/2022 0048   BUN 26 (A) 10/22/2016 0000   CREATININE 2.55 (H) 12/09/2022 0048   CREATININE 1.95 (H) 02/24/2020 0938    CREATININE 2.67 (H) 10/02/2016 1651   CALCIUM 7.9 (L) 12/09/2022 0048   PROT 5.3 (L) 12/07/2022 1200   ALBUMIN 2.3 (L) 12/07/2022 1200   AST 35 12/07/2022 1200   AST 8 (L) 02/24/2020 0938   ALT 20 12/07/2022 1200   ALT 8 02/24/2020 0938   ALKPHOS 77 12/07/2022 1200   BILITOT 1.6 (H) 12/07/2022 1200   BILITOT 0.3 02/24/2020 0938   GFRNONAA 28 (L) 12/09/2022 0048   GFRNONAA 36 (L) 02/24/2020 0938   GFRNONAA 25 (L) 10/02/2016 1651   GFRAA 42 (L) 02/24/2020 0938   GFRAA 29 (L) 10/02/2016 1651   Lipase     Component Value Date/Time   LIPASE 21 12/03/2022 1654       Studies/Results: No results found.  Anti-infectives: Anti-infectives (From admission, onward)    Start     Dose/Rate Route Frequency Ordered Stop   12/05/22 1800  vancomycin (VANCOREADY) IVPB 1250 mg/250 mL  Status:  Discontinued        1,250 mg 166.7 mL/hr over 90 Minutes Intravenous Every 48 hours 12/03/22 2346 12/08/22 1148   12/05/22 1300  meropenem (MERREM) 1 g in sodium  chloride 0.9 % 100 mL IVPB        1 g 200 mL/hr over 30 Minutes Intravenous Every 12 hours 12/05/22 1201     12/04/22 0100  piperacillin-tazobactam (ZOSYN) IVPB 3.375 g  Status:  Discontinued        3.375 g 12.5 mL/hr over 240 Minutes Intravenous Every 8 hours 12/03/22 2346 12/05/22 1159   12/03/22 1700  vancomycin (VANCOREADY) IVPB 2000 mg/400 mL        2,000 mg 200 mL/hr over 120 Minutes Intravenous  Once 12/03/22 1657 12/03/22 2020   12/03/22 1700  piperacillin-tazobactam (ZOSYN) IVPB 3.375 g        3.375 g 100 mL/hr over 30 Minutes Intravenous  Once 12/03/22 1657 12/03/22 1804        Assessment/Plan POD 5, 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 Sheliah Hatch 12/04/22 -NS WD dressing changes to midline wound BID -cont abx therapy, Merrem/vanc now due to ESBL in urine culture -may use NGT for TFs as able per CCM and plans for extubation.  D/w CCM -cont JP drain,output likely secondary to some ascites.   serous -WBC normal -hgb stable at 8.6.  may resume DVT prophylaxis from surgical stnadpoint -scheduled Tylenol and Robaxin to help with non-narcotic pain control.  Ok to resume meds down tube and to restart home oxy to help with longer-acting pain control -fevers resolved  -WOC following for new ileostomy -increase imodium to TID, continue fiber    FEN - NGT clamp/IVFs per primary VTE - SQH ID - vanc/Merrem  Septic shock - back on pressors VDRF - per CCM MMP - per CCM   LOS: 6 days    Gregory [name] , [facility] 12/09/2022, 8:48 AM Please see Amion for pager number during day hours 7:00am-4:30pm or 7:00am -11:30am on weekends

## 2022-12-09 NOTE — Progress Notes (Signed)
NAME:  Gregory [name], MRN:  [id number], DOB:  11-11-1960, LOS: 6 ADMISSION DATE:  12/03/2022, CONSULTATION DATE:  12/09/2022 REFERRING MD:  EDP, CHIEF COMPLAINT:  abdominal pain   History of Present Illness:  Gregory [name] is a 62 y.o. man with COPD on home oxygen and DM2 s/p RLE AKA who is a SNF resident at [facility] with frequent admissions to the hospital.   6/17 Presents to ER with abdominal pain, urinary incontinence, and hypoxia requiring 4L [place]. Lactic Acid 3, given fluid bolus and repeat lactic acid 4. CT with concerns of ileus with dilation of large intestine with stool balls in distal colon. Surgery consulted given rising lactic acid and concern for mesenteric ischemia. Plan to given enema to clear stool burden with frequent serial examinations. Admitted with critical care. WBC 28.7. Started on Vancomycin/Zosyn.   6/18 with worsening lactic acidosis and physical examination, decision made to proceed to OR for exploration. 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 Post-operative setting presented to ICU intubated with hypotension requiring NEO, EPI, Levophed, and vasopressin, with bicarb gtt. 6/19 vasopressor requirements improvement. Patient extubated.   Pertinent  Medical History  S/p RLE AKA DM2 COPD on home oxygen  Significant Hospital Events: Including procedures, antibiotic start and stop dates in addition to other pertinent events   6/17 admitted, s/p exploratory laparotomy for total colectomy due to ischemic colon 6/17 urine>> EColi>> 6/19 > extubated. A.Fib RVR - re intubated that night 6/22 developed A-fib RVR overnight resulting in need for amiodarone drip and reinitiation of Levophed.   6/23 Required addition amiodarone bolus overnight, NSR on am exam   Interim History / Subjective:  Will arose to verbal stimuli, continues to report poorly controlled pain   Objective   Blood pressure (!) 124/46, pulse 77, temperature 99.5  F (37.5 C), resp. rate (!) 22, weight 101.4 kg, SpO2 97 %.    Vent Mode: PRVC FiO2 (%):  [40 %] 40 % Set Rate:  [20 bmp] 20 bmp Vt Set:  [528 mL] 620 mL PEEP:  [5 cmH20] 5 cmH20 Plateau Pressure:  [18 cmH20-21 cmH20] 19 cmH20   Intake/Output Summary (Last 24 hours) at 12/09/2022 1025 Last data filed at 12/09/2022 0800 Gross per 24 hour  Intake 3146.13 ml  Output 2990 ml  Net 156.13 ml    Filed Weights   12/07/22 0301 12/08/22 0149 12/09/22 0320  Weight: 103.5 kg 102.2 kg 101.4 kg   Examination: General: Acute on chronically ill appearing middle aged male lying in bed on mechanical ventilation, in NAD HEENT: ETT, MM pink/moist, PERRL,  Neuro: Alert and interactive on vent, no increased work of breathing  CV: s1s2 regular rate and rhythm, no murmur, rubs, or gallops,  PULM:  Clear to auscultation, no increased work of breathing, tolerating ventilator GI: soft, bowel sounds active in all 4 quadrants, non-tender, non-distended, tolerating trickle tube feeds Extremities: warm/dry, generalized nonpitting edema  Skin: no rashes or lesions  Resolved Hospital Problem list   Anion Gap Metabolic Acidosis Lactic Acidosis  Mild Hyponatremia  Assessment & Plan:   Septic Shock in setting of: Ischemic colitis s/p total colectomy 6/17 E.Coli UTI, +/-sedation effect  P: Continue vancomycin and meropenem Continuous telemetry Continue p.o. 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 Head of bed elevated 30 degrees. Plateau pressures less than  30 cm H20.  Follow intermittent chest x-ray and ABG.   SAT/SBT as tolerated, mentation preclude extubation  Ensure adequate pulmonary hygiene  Follow cultures  VAP bundle in place  PAD protocol  A.Fib  -Weaned off amiodarone 6/20 as back in NSR, overnight 6/22 patient back in A-fib resulting in reinitiation of amiodarone drip -No heparin drip due to down trending H/H and platelets -Echo 6/20 with EF 50%, no significant valve issues PVD HTN HLD P: Required additional amiodarone bolus overnight with eventual transition to sinus rhythm Continue IV amiodarone with plans to transition to p.o.  AKI on CKD Stage 3B Mild hypokalemia Hyperphosphatemia  P: Follow renal function  Monitor urine output Trend Bmet Avoid nephrotoxins Ensure adequate renal perfusion   Anemia, Acute blood loss > stable  P: Trend CBC Transfuse per protocol Hemoglobin goal greater than 7  Type 2 DM P:  Hold home medication Continue SSI CBG goal 140-180  BPH P: Hold home Flomax until hemodynamics stabilize  Pain  Sedation  P: Continue home oxycodone at increased frequency Remains on IV Dilaudid drip Continue home Lexapro, Seroquel, and oxycodone IV Tylenol and Robaxin per surgery  Neuropathy P: Continue home gabapentin  Best Practice (right click and "Reselect all SmartList Selections" daily)   Diet/type: NPO w/ meds via tube DVT prophylaxis: not indicated GI prophylaxis: PPI Lines: Central line, Arterial Line, and yes and it is still needed Foley:  Yes, and it is still needed Code Status:  full code Last date of multidisciplinary goals of care discussion [ pending ]  Critical care time:    Performed by: Brenson Hartman D. Harris  Total critical care time: 36 minutes  Critical care time was exclusive of separately billable procedures and treating other patients.  Critical care was necessary to treat or prevent imminent or life-threatening deterioration.  Critical care was time spent  personally by me on the following activities: development of treatment plan with patient and/or surrogate as well as nursing, discussions with consultants, evaluation of patient's response to treatment, examination of patient, obtaining history from patient or surrogate, ordering and performing treatments and interventions, ordering and review of laboratory studies, ordering and review of radiographic studies, pulse oximetry and re-evaluation of patient's condition.  Ermine Spofford D. Harris, NP-C [facility] Personal contact information can be found on Amion  If no contact or response made please call [phone] 12/09/2022, 10:25 AM

## 2022-12-09 NOTE — Progress Notes (Signed)
eLink Physician-Brief Progress Note Patient Name: SIMS LADAY DOB: 11-03-1960 MRN: [id number]   Date of Service  12/09/2022  HPI/Events of Note  Notified of rapid atrial fibrillation with rate in the 120s despite being on amiodarone gtt.   BP 92/60, HR 126, RR 20, O2 sats 97%.   eICU Interventions  Give another amiodarone 150mg  IV bolus.  Continue amiodarone gtt at 1mg /min.     Intervention Category Intermediate Interventions: Arrhythmia - evaluation and management  Larinda Buttery 12/09/2022, 1:40 AM

## 2022-12-10 DIAGNOSIS — N179 Acute kidney failure, unspecified: Secondary | ICD-10-CM | POA: Diagnosis not present

## 2022-12-10 DIAGNOSIS — R6521 Severe sepsis with septic shock: Secondary | ICD-10-CM | POA: Diagnosis not present

## 2022-12-10 DIAGNOSIS — A419 Sepsis, unspecified organism: Secondary | ICD-10-CM | POA: Diagnosis not present

## 2022-12-10 LAB — CBC
HCT: 26.7 % — ABNORMAL LOW (ref 39.0–52.0)
Hemoglobin: 7.9 g/dL — ABNORMAL LOW (ref 13.0–17.0)
MCH: 26.8 pg (ref 26.0–34.0)
MCHC: 29.6 g/dL — ABNORMAL LOW (ref 30.0–36.0)
MCV: 90.5 fL (ref 80.0–100.0)
Platelets: 129 10*3/uL — ABNORMAL LOW (ref 150–400)
RBC: 2.95 MIL/uL — ABNORMAL LOW (ref 4.22–5.81)
RDW: 16.5 % — ABNORMAL HIGH (ref 11.5–15.5)
WBC: 9.4 10*3/uL (ref 4.0–10.5)
nRBC: 0 % (ref 0.0–0.2)

## 2022-12-10 LAB — BASIC METABOLIC PANEL
Anion gap: 11 (ref 5–15)
BUN: 49 mg/dL — ABNORMAL HIGH (ref 8–23)
CO2: 25 mmol/L (ref 22–32)
Calcium: 7.9 mg/dL — ABNORMAL LOW (ref 8.9–10.3)
Chloride: 105 mmol/L (ref 98–111)
Creatinine, Ser: 2.55 mg/dL — ABNORMAL HIGH (ref 0.61–1.24)
GFR, Estimated: 28 mL/min — ABNORMAL LOW (ref >60)
Glucose, Bld: 120 mg/dL — ABNORMAL HIGH (ref 70–99)
Potassium: 3.8 mmol/L (ref 3.5–5.1)
Sodium: 141 mmol/L (ref 135–145)

## 2022-12-10 LAB — MAGNESIUM: Magnesium: 2.2 mg/dL (ref 1.7–2.4)

## 2022-12-10 LAB — CALCIUM, IONIZED: Calcium, Ionized, Serum: 4.6 mg/dL (ref 4.5–5.6)

## 2022-12-10 LAB — GLUCOSE, CAPILLARY
Glucose-Capillary: 123 mg/dL — ABNORMAL HIGH (ref 70–99)
Glucose-Capillary: 137 mg/dL — ABNORMAL HIGH (ref 70–99)
Glucose-Capillary: 145 mg/dL — ABNORMAL HIGH (ref 70–99)
Glucose-Capillary: 158 mg/dL — ABNORMAL HIGH (ref 70–99)
Glucose-Capillary: 129 mg/dL — ABNORMAL HIGH (ref 70–99)
Glucose-Capillary: 121 mg/dL — ABNORMAL HIGH (ref 70–99)

## 2022-12-10 MED ORDER — OXYCODONE HCL 5 MG PO TABS
20.0000 mg | ORAL_TABLET | Freq: Four times a day (QID) | ORAL | Status: DC
  Administered 2022-12-10 – 2022-12-11 (×4): 20 mg
  Filled 2022-12-10 (×4): qty 4

## 2022-12-10 MED ORDER — PROSOURCE TF20 ENFIT COMPATIBL EN LIQD
60.0000 mL | Freq: Every day | ENTERAL | Status: DC
  Administered 2022-12-10 – 2022-12-13 (×4): 60 mL
  Filled 2022-12-10 (×4): qty 60

## 2022-12-10 MED ORDER — LACTATED RINGERS IV BOLUS
500.0000 mL | Freq: Once | INTRAVENOUS | Status: AC
  Administered 2022-12-10: 500 mL via INTRAVENOUS

## 2022-12-10 MED ORDER — GABAPENTIN 250 MG/5ML PO SOLN
200.0000 mg | Freq: Three times a day (TID) | ORAL | Status: DC
  Administered 2022-12-10 – 2022-12-13 (×10): 200 mg
  Filled 2022-12-10 (×12): qty 4

## 2022-12-10 NOTE — Progress Notes (Signed)
Nutrition Follow-up  DOCUMENTATION CODES:   Obesity unspecified  INTERVENTION:  Continue advancing tube feeds via NG tube: -Continue advancing Vital 1.5 by 10 mL/hour every 8 hours to goal rate of 65 mL/hour (1560 mL goal daily volume) -PROSource TF20 60 mL daily -Provides: 2420 kcal, 125 grams of protein, 1192 mL H2O daily  NUTRITION DIAGNOSIS:   Increased nutrient needs related to wound healing, post-op healing as evidenced by estimated needs.  Ongoing - addressing with tube feeds  GOAL:   Patient will meet greater than or equal to 90% of their needs  Progressing with tube feed advancement  MONITOR:   Vent status, Labs, Weight trends, TF tolerance, Skin, I & O's  REASON FOR ASSESSMENT:   Consult Enteral/tube feeding initiation and management (trickle tube feeds only, no advancement)  ASSESSMENT:   Pt with hx of DM type 2, COPD, HTN, HLD, CHF, GERD, hx CVA, and osteomyelitis s/p Right AKA presented to ED from SNF with severe abdominal pain. Workup in ED worrisome for bowel ischemia.  06/17 - admitted 06/18 - s/p exploratory laparotomy, total abdominal colectomy with end ileostomy, returned to ICU intubated 06/19 - extubated, trickle tube feeds started via NG tube, later reintubated 06/20 - TF held due to increased pressor requirements and overall worsening status, NG tube to LIWS 06/22 - trickle tube feeds resumed 06/23 - tube feeds advanced towards goal  Pt intubated at time of RD assessment. Awake but resting with eyes closed. Gave RD "thumbs up" sign when asked how pt tolerating tube feeds. Per review of chart tube feeds started to advance towards goal yesterday. Per discussion with RN patient is tolerating.   Admission wt 106.6 kg on 6/18. Current wt 101.8 kg on 6/24. Recommend continuing to monitor trend.  Patient is currently intubated on ventilator support MV: 10.6 L/min Temp (24hrs), Avg:99.7 F (37.6 C), Min:99.3 F (37.4 C), Max:100.4 F (38  C)  Medications reviewed and include: amiodarone, Nutrisource fiber 1 packet BID per tube, gabapentin, Novolog 0-15 units Q4hours, Semglee 4 units daily, Imodium 2 mg TID per tube, pantoprazole, Dilaudid, ketamin, phenylephrine gtt at 120 mcg/min  Labs reviewed: CBG 107-145, BUN 49, Creatinine 2.55  Enteral Access: 18 Fr. NG tube placed 6/18; terminates in stomach per abdominal x-ray 6/19  Tube feeds: Vital 1.5 currently at 50 mL/hour and advancing by 10 mL/hour every 8 hours to goal rate of 65 mL/hour  UOP: 1520 mL (0.6 mL/kg/hr) in previous 24 hours  Ileostomy output: 650 mL + 2 occurrences unmeasured output  LUQ JP drain: 1290 mL output  I/O: +2899.5 mL since admission  Diet Order:   Diet Order             Diet NPO time specified  Diet effective now                  EDUCATION NEEDS:   Not appropriate for education at this time  Skin:  Skin Assessment: Skin Integrity Issues: Skin Integrity Issues:: Stage II, Stage III, Incisions, Other (Comment) Stage II: R buttocks Stage III: L posterior thigh Incisions: abdomen Other: non-pressure wound (no location documented)  Last BM:  12/08/22 ileostomy (2490 ml x 24 hours)  Height:   Ht Readings from Last 1 Encounters:  11/27/22 6' (1.829 m)   Weight:   Wt Readings from Last 1 Encounters:  12/10/22 101.8 kg   Ideal Body Weight:  72.8 kg (adjusted by 10% for hx of R AKA)  BMI:  Body mass index is 30.44 kg/m.  Estimated Nutritional Needs:   Kcal:  2300-2500 kcal/d  Protein:  120-140g/d  Fluid:  2.2L/d  Letta Median, MS, RD, LDN, CNSC Pager number available on Amion

## 2022-12-10 NOTE — Consult Note (Signed)
WOC Nurse ostomy follow up Stoma type/location: RMQ ileostomy Stomal assessment/size: 1 3/4" pink and moist, remains edematous and today, longer and prolapsed. Peristomal assessment: skin intact Treatment options for stomal/peristomal skin: 2 1/4" high output pouch.  Attached to bedside drainage.   Output  liquid yellow stool Ostomy pouching: 2pc. 2 1/4"  Education provided: None  Patient is intubated. Raises arms at times.  Enrolled patient in [facility] Discharge program: No  Unclear discharge disposition.  Will follow.  Mike Gip MSN, RN, FNP-BC CWON Wound, Ostomy, Continence Nurse Outpatient [facility] [phone] Pager [phone]

## 2022-12-10 NOTE — Progress Notes (Signed)
7 Days Post-Op   Subjective/Chief Complaint: Pt with no acute changes Ostomy output down  Objective: Vital signs in last 24 hours: Temp:  [99.3 F (37.4 C)-100.4 F (38 C)] 99.3 F (37.4 C) (06/24 0800) Pulse Rate:  [66-81] 66 (06/24 0800) Resp:  [11-26] 18 (06/24 0800) BP: (104-148)/(44-63) 128/51 (06/24 0800) SpO2:  [94 %-100 %] 100 % (06/24 0800) Arterial Line BP: (111-180)/(40-64) 114/41 (06/24 0800) FiO2 (%):  [40 %] 40 % (06/24 0745) Weight:  [101.8 kg] 101.8 kg (06/24 0103) Last BM Date : 12/10/22  Intake/Output from previous day: 06/23 0701 - 06/24 0700 In: 2847.8 [I.V.:1186.8; NG/GT:1350.8; IV Piggyback:310.2] Out: 3460 [Urine:1520; Drains:1290; Stool:650] Intake/Output this shift: No intake/output data recorded.  PE: Gen: awake and nods head to questions Abd: obese, wound is clean and packed, tissue is pale. JP drain with serous fluid, NGT in place with no output currently. Ileostomy is viable and productive of stool.  0.65L yesterday   Lab Results:  Recent Labs    12/09/22 0048 12/10/22 0100  WBC 6.6 9.4  HGB 8.6* 7.9*  HCT 28.3* 26.7*  PLT 105* 129*   BMET Recent Labs    12/09/22 0048 12/10/22 0100  NA 139 141  K 3.5 3.8  CL 102 105  CO2 26 25  GLUCOSE 121* 120*  BUN 48* 49*  CREATININE 2.55* 2.55*  CALCIUM 7.9* 7.9*   PT/INR No results for input(s): "LABPROT", "INR" in the last 72 hours. ABG Recent Labs    12/08/22 1315  PHART 7.426  HCO3 28.2*    Studies/Results: No results found.  Anti-infectives: Anti-infectives (From admission, onward)    Start     Dose/Rate Route Frequency Ordered Stop   12/05/22 1800  vancomycin (VANCOREADY) IVPB 1250 mg/250 mL  Status:  Discontinued        1,250 mg 166.7 mL/hr over 90 Minutes Intravenous Every 48 hours 12/03/22 2346 12/08/22 1148   12/05/22 1300  meropenem (MERREM) 1 g in sodium chloride 0.9 % 100 mL IVPB        1 g 200 mL/hr over 30 Minutes Intravenous Every 12 hours 12/05/22 1201  12/09/22 1139   12/04/22 0100  piperacillin-tazobactam (ZOSYN) IVPB 3.375 g  Status:  Discontinued        3.375 g 12.5 mL/hr over 240 Minutes Intravenous Every 8 hours 12/03/22 2346 12/05/22 1159   12/03/22 1700  vancomycin (VANCOREADY) IVPB 2000 mg/400 mL        2,000 mg 200 mL/hr over 120 Minutes Intravenous  Once 12/03/22 1657 12/03/22 2020   12/03/22 1700  piperacillin-tazobactam (ZOSYN) IVPB 3.375 g        3.375 g 100 mL/hr over 30 Minutes Intravenous  Once 12/03/22 1657 12/03/22 1804       Assessment/Plan: POD 6, 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 Sheliah Hatch 12/04/22 -NS WD dressing changes to midline wound BID -cont abx therapy, Merrem/vanc now due to ESBL in urine culture -may use NGT for TFs as able per CCM and plans for extubation.  D/w CCM -cont JP drain,output likely secondary to some ascites.  serous -WBC normal -hgb 7.9.  may resume DVT prophylaxis from surgical stnadpoint -scheduled Tylenol and Robaxin to help with non-narcotic pain control.  Ok to resume meds down tube and to restart home oxy to help with longer-acting pain control -fevers resolved  -WOC following for new ileostomy -imodium to TID, continue fiber, both seem to be slowing down ostomy output     FEN -  NGT clamp/IVFs per primary VTE - SQH ID - vanc/Merrem   Septic shock - back on pressors VDRF - per CCM MMP - per CCM  LOS: 7 days    Axel Filler 12/10/2022

## 2022-12-10 NOTE — Progress Notes (Signed)
eLink Physician-Brief Progress Note Patient Name: Gregory [name] DOB: [birth year] MRN: [id number]   Date of Service  12/10/2022  HPI/Events of Note  Notified that MAP <65 but only because diastolic blood pressure is low.   Current BP 130-46, MAP 69. Pt is making urine, and has good pulses.    eICU Interventions  Continue to monitor BP as long as SBP >100.     Intervention Category Minor Interventions: Other:  Larinda Buttery 12/10/2022, 10:44 PM

## 2022-12-10 NOTE — Progress Notes (Signed)
PT Cancellation Note  Patient Details Name: Gregory [name] MRN: [id number] DOB: July 13, 1960   Cancelled Treatment:    Reason Eval/Treat Not Completed: Medical issues which prohibited therapy (Dr. Everardo All agrees with PT to sign off and MD will re-consult if pt status changes.)   [name] 12/10/2022, 9:56 AM [name],PT Acute Rehab Services [phone]

## 2022-12-10 NOTE — Progress Notes (Signed)
NAME:  Gregory [name], MRN:  [id number], DOB:  04-06-1961, LOS: 7 ADMISSION DATE:  12/03/2022, CONSULTATION DATE:  12/10/2022 REFERRING MD:  EDP, CHIEF COMPLAINT:  abdominal pain   History of Present Illness:  Gregory [name] is a 62 y.o. man with COPD on home oxygen and DM2 s/p RLE AKA who is a SNF resident at [facility] with frequent admissions to the hospital.   6/17 Presents to ER with abdominal pain, urinary incontinence, and hypoxia requiring 4L [place]. Lactic Acid 3, given fluid bolus and repeat lactic acid 4. CT with concerns of ileus with dilation of large intestine with stool balls in distal colon. Surgery consulted given rising lactic acid and concern for mesenteric ischemia. Plan to given enema to clear stool burden with frequent serial examinations. Admitted with critical care. WBC 28.7. Started on Vancomycin/Zosyn.   6/18 with worsening lactic acidosis and physical examination, decision made to proceed to OR for exploration. 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 Post-operative setting presented to ICU intubated with hypotension requiring NEO, EPI, Levophed, and vasopressin, with bicarb gtt. 6/19 vasopressor requirements improvement. Patient extubated.   Pertinent  Medical History  S/p RLE AKA DM2 COPD on home oxygen  Significant Hospital Events: Including procedures, antibiotic start and stop dates in addition to other pertinent events   6/17 admitted, s/p exploratory laparotomy for total colectomy due to ischemic colon 6/17 urine>> EColi>> 6/19 > extubated. A.Fib RVR - re intubated that night 6/22 developed A-fib RVR overnight resulting in need for amiodarone drip and reinitiation of Levophed.   6/23 Required addition amiodarone bolus overnight, NSR on am exam   Interim History / Subjective:  Dilaudid gtt weaned to 2. Unchanged ketamine Low dose neo started overnight for low BP  Objective   Blood pressure (!) 128/51, pulse 66,  temperature 99.3 F (37.4 C), temperature source Bladder, resp. rate 18, weight 101.8 kg, SpO2 100 %.    Vent Mode: PRVC FiO2 (%):  [40 %] 40 % Set Rate:  [18 bmp] 18 bmp Vt Set:  [620 mL] 620 mL PEEP:  [5 cmH20] 5 cmH20 Pressure Support:  [10 cmH20-12 cmH20] 12 cmH20 Plateau Pressure:  [18 cmH20-20 cmH20] 18 cmH20   Intake/Output Summary (Last 24 hours) at 12/10/2022 0912 Last data filed at 12/10/2022 0900 Gross per 24 hour  Intake 2569.67 ml  Output 3295 ml  Net -725.33 ml   Filed Weights   12/08/22 0149 12/09/22 0320 12/10/22 0103  Weight: 102.2 kg 101.4 kg 101.8 kg   Physical Exam: General: Chronically illl-appearing, no acute distress HENT: , AT, ETT in place Eyes: EOMI, no scleral icterus Respiratory: Clear to auscultation bilaterally.  No crackles, wheezing or rales Cardiovascular: RRR, -M/R/G, no JVD GI: Abdominal wound dressing in place with JP drain with serous fluid, BS+, soft, tender to palpation, ileostomy with stool Extremities: Right AKA, left lymphedema s/p multiple toe amputations Neuro: Eyes open, nods head, able to stick tongue out on command, moves upper extremities spontaneously GU: Foley in place  Labs 6/24 BUN/Cr 49/2.55 improved from admission but stable in the last 48-72 hours Hg 7.9 stable Plt 129   Resolved Hospital Problem list   Anion Gap Metabolic Acidosis Lactic Acidosis  Mild Hyponatremia  Assessment & Plan:   Septic Shock in setting of: Ischemic colitis s/p total colectomy 6/17 E.Coli UTI, +/-sedation effect  P: Continue Vanc and Meropenem Continuous telemetry Continue p.o. 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 Plateau pressures less than 30 cm H20.  Follow intermittent chest x-ray and ABG.   SAT/SBT as tolerated, mentation precludes extubation May need to consider trach next week. Family reports he had one in 2013 Scheduled nebulizers Ensure adequate pulmonary hygiene  Follow cultures  VAP bundle in place  PAD protocol  A.Fib - currently in NSR -Amio gtt restarted on 6/22. Transition to oral 6/23 -No heparin drip due to down trending H/H and platelets -Echo 6/20 with EF 50%, no significant valve issues PVD HTN HLD P: Tele Maintain PO amio daily  AKI on CKD Stage 3B Mild hypokalemia Hyperphosphatemia  P: Follow renal function  Monitor urine output Trend Bmet Avoid nephrotoxins Ensure adequate renal perfusion   Anemia, Acute blood loss > decreased to 7.9 but overall stable  P: Trend CBC Transfuse per protocol Hemoglobin goal greater than 7  Type 2 DM P:  Hold home medication Continue SSI CBG goal 140-180  BPH P: Hold home Flomax until hemodynamics stabilize  Pain  Sedation  P: Continue home oxycodone at increased frequency Weaned IV Dilaudid gtt to 2 Continue home Lexapro, Seroquel, and oxycodone IV Tylenol and Robaxin per surgery  Neuropathy P: Increase gabapentin 200 mg q8h  Best Practice (right click and "Reselect all SmartList Selections" daily)   Diet/type: tubefeeds DVT prophylaxis: prophylactic heparin  GI prophylaxis: PPI Lines: Central line, Arterial Line, and yes and it is still needed Foley:  Yes, and it is still needed Code Status:  full code Last date of multidisciplinary goals of care discussion [ 6/24 ] Updated daughter on above  Critical care time:    The patient is critically ill with multiple organ systems failure and requires high complexity decision making for assessment and support, frequent  evaluation and titration of therapies, application of advanced monitoring technologies and extensive interpretation of multiple databases.  Independent Critical Care Time: 40 Minutes.   Mechele Collin, M.D. [facility] Pulmonary/Critical Care Medicine 12/10/2022 9:13 AM   Please see Amion for pager number to reach on-call Pulmonary and Critical Care Team.

## 2022-12-11 DIAGNOSIS — J9621 Acute and chronic respiratory failure with hypoxia: Secondary | ICD-10-CM

## 2022-12-11 LAB — CBC
HCT: 26 % — ABNORMAL LOW (ref 39.0–52.0)
Hemoglobin: 7.6 g/dL — ABNORMAL LOW (ref 13.0–17.0)
MCH: 27.2 pg (ref 26.0–34.0)
MCHC: 29.2 g/dL — ABNORMAL LOW (ref 30.0–36.0)
MCV: 93.2 fL (ref 80.0–100.0)
Platelets: 189 10*3/uL (ref 150–400)
RBC: 2.79 MIL/uL — ABNORMAL LOW (ref 4.22–5.81)
RDW: 16.4 % — ABNORMAL HIGH (ref 11.5–15.5)
WBC: 10.7 10*3/uL — ABNORMAL HIGH (ref 4.0–10.5)
nRBC: 0 % (ref 0.0–0.2)

## 2022-12-11 LAB — CULTURE, BLOOD (ROUTINE X 2): Culture: NO GROWTH

## 2022-12-11 LAB — GLUCOSE, CAPILLARY
Glucose-Capillary: 118 mg/dL — ABNORMAL HIGH (ref 70–99)
Glucose-Capillary: 136 mg/dL — ABNORMAL HIGH (ref 70–99)
Glucose-Capillary: 143 mg/dL — ABNORMAL HIGH (ref 70–99)
Glucose-Capillary: 177 mg/dL — ABNORMAL HIGH (ref 70–99)
Glucose-Capillary: 210 mg/dL — ABNORMAL HIGH (ref 70–99)
Glucose-Capillary: 216 mg/dL — ABNORMAL HIGH (ref 70–99)

## 2022-12-11 LAB — BASIC METABOLIC PANEL
Anion gap: 8 (ref 5–15)
BUN: 49 mg/dL — ABNORMAL HIGH (ref 8–23)
CO2: 25 mmol/L (ref 22–32)
Calcium: 7.8 mg/dL — ABNORMAL LOW (ref 8.9–10.3)
Chloride: 110 mmol/L (ref 98–111)
Creatinine, Ser: 2.3 mg/dL — ABNORMAL HIGH (ref 0.61–1.24)
GFR, Estimated: 31 mL/min — ABNORMAL LOW (ref >60)
Glucose, Bld: 148 mg/dL — ABNORMAL HIGH (ref 70–99)
Potassium: 3.9 mmol/L (ref 3.5–5.1)
Sodium: 143 mmol/L (ref 135–145)

## 2022-12-11 LAB — PHOSPHORUS: Phosphorus: 5.6 mg/dL — ABNORMAL HIGH (ref 2.5–4.6)

## 2022-12-11 LAB — MAGNESIUM: Magnesium: 2.2 mg/dL (ref 1.7–2.4)

## 2022-12-11 MED ORDER — HYDRALAZINE HCL 10 MG PO TABS
10.0000 mg | ORAL_TABLET | Freq: Three times a day (TID) | ORAL | Status: DC
  Administered 2022-12-11 – 2022-12-13 (×7): 10 mg
  Filled 2022-12-11 (×10): qty 1

## 2022-12-11 MED ORDER — ACETAMINOPHEN 500 MG PO TABS
1000.0000 mg | ORAL_TABLET | Freq: Four times a day (QID) | ORAL | Status: DC
  Administered 2022-12-11 – 2022-12-13 (×8): 1000 mg
  Filled 2022-12-11 (×9): qty 2

## 2022-12-11 MED ORDER — OXYCODONE HCL 5 MG PO TABS
20.0000 mg | ORAL_TABLET | ORAL | Status: DC
  Administered 2022-12-11 – 2022-12-13 (×14): 20 mg
  Filled 2022-12-11 (×13): qty 4

## 2022-12-11 MED ORDER — CARVEDILOL 12.5 MG PO TABS
12.5000 mg | ORAL_TABLET | Freq: Two times a day (BID) | ORAL | Status: DC
  Administered 2022-12-11 – 2022-12-13 (×5): 12.5 mg
  Filled 2022-12-11 (×5): qty 1

## 2022-12-11 MED ORDER — HYDRALAZINE HCL 20 MG/ML IJ SOLN
10.0000 mg | Freq: Four times a day (QID) | INTRAMUSCULAR | Status: DC | PRN
  Administered 2022-12-11: 10 mg via INTRAVENOUS
  Filled 2022-12-11: qty 1

## 2022-12-11 MED ORDER — LABETALOL HCL 5 MG/ML IV SOLN: 5.0000 mg | Freq: Four times a day (QID) | INTRAVENOUS | Status: DC | PRN

## 2022-12-11 MED ORDER — LABETALOL HCL 5 MG/ML IV SOLN
INTRAVENOUS | Status: AC
  Administered 2022-12-11: 5 mg via INTRAVENOUS
  Filled 2022-12-11: qty 4

## 2022-12-11 MED ORDER — METHADONE HCL 10 MG PO TABS
10.0000 mg | ORAL_TABLET | Freq: Three times a day (TID) | ORAL | Status: DC
  Administered 2022-12-11 – 2022-12-13 (×7): 10 mg
  Filled 2022-12-11 (×7): qty 1

## 2022-12-11 NOTE — Progress Notes (Signed)
8 Days Post-Op   Subjective/Chief Complaint: Pt with no acute changes, except temp overnight of 101.  WBC nl Ostomy output around 1200cc.  Objective: Vital signs in last 24 hours: Temp:  [99.5 F (37.5 C)-101.3 F (38.5 C)] 101.1 F (38.4 C) (06/25 0711) Pulse Rate:  [68-85] 80 (06/25 0745) Resp:  [11-20] 11 (06/25 0745) BP: (121-149)/(44-72) 131/44 (06/25 0718) SpO2:  [97 %-100 %] 99 % (06/25 0745) Arterial Line BP: (125-170)/(38-48) 142/45 (06/25 0745) FiO2 (%):  [40 %] 40 % (06/25 0722) Weight:  [101.4 kg] 101.4 kg (06/25 0500) Last BM Date : 12/11/22  Intake/Output from previous day: 06/24 0701 - 06/25 0700 In: 2034.8 [I.V.:505.1; NG/GT:1529.7] Out: 3230 [Urine:1515; Drains:575; Stool:1140] Intake/Output this shift: Total I/O In: 65 [I.V.:5; NG/GT:60] Out: 275 [Urine:220; Drains:55]  PE: Gen: awake and nods head to questions Abd: obese, wound is clean and packed, tissue is pale. JP drain with serous fluid, NGT in place with TFs at goal rate. Ileostomy is viable and productive of stool.  1.2L of ileostomy output   Lab Results:  Recent Labs    12/10/22 0100 12/11/22 0405  WBC 9.4 10.7*  HGB 7.9* 7.6*  HCT 26.7* 26.0*  PLT 129* 189   BMET Recent Labs    12/10/22 0100 12/11/22 0405  NA 141 143  K 3.8 3.9  CL 105 110  CO2 25 25  GLUCOSE 120* 148*  BUN 49* 49*  CREATININE 2.55* 2.30*  CALCIUM 7.9* 7.8*   PT/INR No results for input(s): "LABPROT", "INR" in the last 72 hours. ABG Recent Labs    12/08/22 1315  PHART 7.426  HCO3 28.2*    Studies/Results: No results found.  Anti-infectives: Anti-infectives (From admission, onward)    Start     Dose/Rate Route Frequency Ordered Stop   12/05/22 1800  vancomycin (VANCOREADY) IVPB 1250 mg/250 mL  Status:  Discontinued        1,250 mg 166.7 mL/hr over 90 Minutes Intravenous Every 48 hours 12/03/22 2346 12/08/22 1148   12/05/22 1300  meropenem (MERREM) 1 g in sodium chloride 0.9 % 100 mL IVPB        1  g 200 mL/hr over 30 Minutes Intravenous Every 12 hours 12/05/22 1201 12/10/22 0700   12/04/22 0100  piperacillin-tazobactam (ZOSYN) IVPB 3.375 g  Status:  Discontinued        3.375 g 12.5 mL/hr over 240 Minutes Intravenous Every 8 hours 12/03/22 2346 12/05/22 1159   12/03/22 1700  vancomycin (VANCOREADY) IVPB 2000 mg/400 mL        2,000 mg 200 mL/hr over 120 Minutes Intravenous  Once 12/03/22 1657 12/03/22 2020   12/03/22 1700  piperacillin-tazobactam (ZOSYN) IVPB 3.375 g        3.375 g 100 mL/hr over 30 Minutes Intravenous  Once 12/03/22 1657 12/03/22 1804       Assessment/Plan: POD 8, 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 Sheliah Hatch 12/04/22 -NS WD dressing changes to midline wound BID -completed abx therapy for abd and for ESBL in urine -NGT in place for TFs at goal rate.  Tolerating well. -cont JP drain,output likely secondary to some ascites.  serous -WBC normal -hgb 7.6.  may resume DVT prophylaxis from surgical stnadpoint -scheduled Tylenol and Robaxin to help with non-narcotic pain control.  Ok to resume meds down tube and to restart home oxy to help with longer-acting pain control -fever overnight of 101.  Will follow -WOC following for new ileostomy -imodium to TID, continue fiber,  both seem to be slowing down ostomy output.  Monitor.     FEN - NGT with TFs/IVFs per primary VTE - SQH ID - vanc/Merrem, completed   Septic shock - resolved VDRF - per CCM MMP - per CCM  LOS: 8 days    Letha Cape 12/11/2022

## 2022-12-11 NOTE — Progress Notes (Signed)
NAME:  Gregory [name], MRN:  [id number], DOB:  1961/03/01, LOS: 8 ADMISSION DATE:  12/03/2022, CONSULTATION DATE:  12/11/2022 REFERRING MD:  EDP, CHIEF COMPLAINT:  abdominal pain   History of Present Illness:  Gregory [name] is a 62 y.o. man with COPD on home oxygen and DM2 s/p RLE AKA who is a SNF resident at [facility] with frequent admissions to the hospital.   6/17 Presents to ER with abdominal pain, urinary incontinence, and hypoxia requiring 4L [place]. Lactic Acid 3, given fluid bolus and repeat lactic acid 4. CT with concerns of ileus with dilation of large intestine with stool balls in distal colon. Surgery consulted given rising lactic acid and concern for mesenteric ischemia. Plan to given enema to clear stool burden with frequent serial examinations. Admitted with critical care. WBC 28.7. Started on Vancomycin/Zosyn.   6/18 with worsening lactic acidosis and physical examination, decision made to proceed to OR for exploration. 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 Post-operative setting presented to ICU intubated with hypotension requiring NEO, EPI, Levophed, and vasopressin, with bicarb gtt. 6/19 vasopressor requirements improvement. Patient extubated.   Pertinent  Medical History  S/p RLE AKA DM2 COPD on home oxygen  Significant Hospital Events: Including procedures, antibiotic start and stop dates in addition to other pertinent events   6/17 admitted, s/p exploratory laparotomy for total colectomy due to ischemic colon 6/17 urine>> EColi>> 6/19 > extubated. A.Fib RVR - re intubated that night 6/22 developed A-fib RVR overnight resulting in need for amiodarone drip and reinitiation of Levophed.   6/23 Required addition amiodarone bolus overnight, NSR on am exam  6/24 Started on enteral oxy 6/25 Ketamine discontinued  Interim History / Subjective:  Dilaudid gtt increased to 3 Febrile 101.1. Briefly required levo which was weaned off  after fluid bolus Tolerating PS, following commands  Objective   Blood pressure (!) 131/44, pulse 80, temperature (!) 101.1 F (38.4 C), temperature source Bladder, resp. rate 11, weight 101.4 kg, SpO2 99 %.    Vent Mode: PSV;CPAP FiO2 (%):  [40 %] 40 % Set Rate:  [18 bmp] 18 bmp Vt Set:  [620 mL] 620 mL PEEP:  [5 cmH20] 5 cmH20 Pressure Support:  [10 cmH20] 10 cmH20 Plateau Pressure:  [14 cmH20-17 cmH20] 14 cmH20   Intake/Output Summary (Last 24 hours) at 12/11/2022 0849 Last data filed at 12/11/2022 0800 Gross per 24 hour  Intake 2099.72 ml  Output 3505 ml  Net -1405.28 ml   Filed Weights   12/09/22 0320 12/10/22 0103 12/11/22 0500  Weight: 101.4 kg 101.8 kg 101.4 kg   Physical Exam: General: Chronically ill-appearing, no acute distress HENT: [place], AT, ETT in place Eyes: EOMI, no scleral icterus Respiratory: Clear to auscultation bilaterally.  No crackles, wheezing or rales Cardiovascular: RRR, -M/R/G, no JVD GI: Abdominal wound dressing in place with JP drain with serous fluid, BS+, soft, tender to palpation, ileostomy with stool Extremities: Right AKA, left lymphedema s/p multiple toe amputations, -tenderness Neuro: Eyes open to voice, follows simple commands including sticking out tongue, CNII-XII grossly intact GU: Foley in place  Labs 6/24 BUN/Cr 49/2.30 improved Hg 7.6 stable Plt 189 - normalized   Resolved Hospital Problem list   Anion Gap Metabolic Acidosis Lactic Acidosis  Mild Hyponatremia  Assessment & Plan:   Septic Shock in setting of: Ischemic colitis s/p total colectomy 6/17 E.Coli UTI, +/-sedation effect  P: S/p Mero x 5 days Continuous telemetry Off levophed. Wean pressors for  MAP goal greater than 65 Continue p.o. midodrine. 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 Continue vent weaning as tolerated LTVV, 4-8cc/kg IBW with goal Pplat<30 and DP<15 Head of bed elevated 30 degrees. Plateau pressures less than 30 cm H20.  Follow intermittent chest x-ray and ABG.   SAT/SBT as tolerated, mentation precludes extubation May need to consider trach next week. Family reports he had one in 2013 Scheduled nebulizers Ensure adequate pulmonary hygiene  VAP bundle in place  PAD protocol  Fever Trend fever/WBC. No evidence of new infections Hold antibiotics for now  A.Fib - currently in NSR -Amio gtt restarted on 6/22. Transition to oral 6/23 -No heparin drip due to down trending H/H and platelets -Echo 6/20 with EF 50%, no significant valve issues PVD HTN HLD P: Tele Maintain PO amio daily EKG to monitor QTC  AKI on CKD Stage 3B Mild hypokalemia Hyperphosphatemia  P: Follow renal function  Monitor urine output Trend Bmet Avoid nephrotoxins Ensure adequate renal perfusion   Anemia, Acute blood loss > decreased to 7.9 but overall stable  P: Trend CBC Transfuse per protocol Hemoglobin goal greater than 7  Type 2 DM P:  Hold home medication Continue SSI CBG goal 140-180  BPH P: Hold home Flomax until hemodynamics stabilize  Pain  Sedation  P: Continue home oxycodone at increased frequency Weaned IV Dilaudid gtt  Scheduled oxycodone 20 mg q4h and PRN q6h Consider methadone if Qtc appropriate Continue home Lexapro, Seroquel, and oxycodone IV Tylenol and Robaxin per surgery  Neuropathy P: Increased gabapentin 200 mg q8h 6/24  Best Practice (right click and "Reselect all SmartList Selections" daily)   Diet/type: tubefeeds DVT prophylaxis: prophylactic heparin  GI prophylaxis: PPI Lines: Central line, Arterial Line, and yes and it is still needed Foley:  Yes, and it  is still needed Code Status:  full code Last date of multidisciplinary goals of care discussion [ 6/24 ] Updated daughter on above  Critical care time:    The patient is critically ill with multiple organ systems failure and requires high complexity decision making for assessment and support, frequent evaluation and titration of therapies, application of advanced monitoring technologies and extensive interpretation of multiple databases.  Independent Critical Care Time: 40 Minutes.   Mechele Collin, M.D. [facility] Pulmonary/Critical Care Medicine 12/11/2022 8:49 AM   Please see Amion for pager number to reach on-call Pulmonary and Critical Care Team.

## 2022-12-12 DIAGNOSIS — A419 Sepsis, unspecified organism: Secondary | ICD-10-CM | POA: Diagnosis not present

## 2022-12-12 DIAGNOSIS — K559 Vascular disorder of intestine, unspecified: Secondary | ICD-10-CM

## 2022-12-12 DIAGNOSIS — R6521 Severe sepsis with septic shock: Secondary | ICD-10-CM | POA: Diagnosis not present

## 2022-12-12 DIAGNOSIS — J9601 Acute respiratory failure with hypoxia: Secondary | ICD-10-CM | POA: Diagnosis not present

## 2022-12-12 LAB — BASIC METABOLIC PANEL
Anion gap: 12 (ref 5–15)
BUN: 59 mg/dL — ABNORMAL HIGH (ref 8–23)
CO2: 24 mmol/L (ref 22–32)
Calcium: 8.2 mg/dL — ABNORMAL LOW (ref 8.9–10.3)
Chloride: 111 mmol/L (ref 98–111)
Creatinine, Ser: 2.39 mg/dL — ABNORMAL HIGH (ref 0.61–1.24)
GFR, Estimated: 30 mL/min — ABNORMAL LOW (ref >60)
Glucose, Bld: 222 mg/dL — ABNORMAL HIGH (ref 70–99)
Potassium: 4.6 mmol/L (ref 3.5–5.1)
Sodium: 147 mmol/L — ABNORMAL HIGH (ref 135–145)

## 2022-12-12 LAB — CBC
HCT: 25.7 % — ABNORMAL LOW (ref 39.0–52.0)
Hemoglobin: 7.4 g/dL — ABNORMAL LOW (ref 13.0–17.0)
MCH: 27.3 pg (ref 26.0–34.0)
MCHC: 28.8 g/dL — ABNORMAL LOW (ref 30.0–36.0)
MCV: 94.8 fL (ref 80.0–100.0)
Platelets: 271 10*3/uL (ref 150–400)
RBC: 2.71 MIL/uL — ABNORMAL LOW (ref 4.22–5.81)
RDW: 16.1 % — ABNORMAL HIGH (ref 11.5–15.5)
WBC: 11.7 10*3/uL — ABNORMAL HIGH (ref 4.0–10.5)
nRBC: 0 % (ref 0.0–0.2)

## 2022-12-12 LAB — MAGNESIUM: Magnesium: 2.1 mg/dL (ref 1.7–2.4)

## 2022-12-12 LAB — GLUCOSE, CAPILLARY
Glucose-Capillary: 155 mg/dL — ABNORMAL HIGH (ref 70–99)
Glucose-Capillary: 175 mg/dL — ABNORMAL HIGH (ref 70–99)
Glucose-Capillary: 181 mg/dL — ABNORMAL HIGH (ref 70–99)
Glucose-Capillary: 191 mg/dL — ABNORMAL HIGH (ref 70–99)
Glucose-Capillary: 196 mg/dL — ABNORMAL HIGH (ref 70–99)
Glucose-Capillary: 202 mg/dL — ABNORMAL HIGH (ref 70–99)

## 2022-12-12 LAB — RETICULOCYTES
Immature Retic Fract: 10.4 % (ref 2.3–15.9)
RBC.: 2.65 MIL/uL — ABNORMAL LOW (ref 4.22–5.81)
Retic Count, Absolute: 20.3 10*3/uL (ref 19.0–186.0)
Retic Ct Pct: 0.8 % (ref 0.4–3.1)

## 2022-12-12 LAB — PHOSPHORUS: Phosphorus: 5.1 mg/dL — ABNORMAL HIGH (ref 2.5–4.6)

## 2022-12-12 LAB — IRON AND TIBC
Iron: <5 ug/dL — ABNORMAL LOW (ref 45–182)
TIBC: 132 ug/dL — ABNORMAL LOW (ref 250–450)

## 2022-12-12 LAB — FOLATE: Folate: 10.6 ng/mL (ref >5.9)

## 2022-12-12 LAB — VITAMIN B12: Vitamin B-12: 3381 pg/mL — ABNORMAL HIGH (ref 180–914)

## 2022-12-12 LAB — FERRITIN: Ferritin: 312 ng/mL (ref 24–336)

## 2022-12-12 MED ORDER — NUTRISOURCE FIBER PO PACK
1.0000 | PACK | Freq: Three times a day (TID) | ORAL | Status: DC
  Administered 2022-12-12 – 2022-12-13 (×4): 1
  Filled 2022-12-12 (×3): qty 1

## 2022-12-12 MED ORDER — INSULIN GLARGINE-YFGN 100 UNIT/ML ~~LOC~~ SOLN
4.0000 [IU] | Freq: Two times a day (BID) | SUBCUTANEOUS | Status: DC
  Administered 2022-12-12 – 2022-12-25 (×23): 4 [IU] via SUBCUTANEOUS
  Filled 2022-12-12 (×28): qty 0.04

## 2022-12-12 MED ORDER — ATORVASTATIN CALCIUM 80 MG PO TABS
80.0000 mg | ORAL_TABLET | Freq: Every day | ORAL | Status: DC
  Administered 2022-12-12 – 2022-12-13 (×2): 80 mg
  Filled 2022-12-12 (×2): qty 1

## 2022-12-12 MED ORDER — LOPERAMIDE HCL 1 MG/7.5ML PO SUSP
2.0000 mg | Freq: Four times a day (QID) | ORAL | Status: DC
  Administered 2022-12-12 – 2022-12-13 (×6): 2 mg
  Filled 2022-12-12 (×5): qty 15

## 2022-12-12 MED ORDER — ASPIRIN 81 MG PO CHEW
81.0000 mg | CHEWABLE_TABLET | Freq: Every day | ORAL | Status: DC
  Administered 2022-12-12 – 2022-12-13 (×2): 81 mg
  Filled 2022-12-12 (×2): qty 1

## 2022-12-12 MED ORDER — FREE WATER
200.0000 mL | Freq: Four times a day (QID) | Status: DC
  Administered 2022-12-12 – 2022-12-13 (×5): 200 mL

## 2022-12-12 MED ORDER — ALBUTEROL SULFATE (2.5 MG/3ML) 0.083% IN NEBU: 2.5000 mg | INHALATION_SOLUTION | RESPIRATORY_TRACT | Status: DC | PRN

## 2022-12-12 NOTE — Consult Note (Addendum)
WOC Nurse ostomy follow up Requested by critical care team to apply a pouching system around leaking suprapubic tube to left lower abd near groin skin fold. Mod amt yellow urine leaking continuously around SP tube insertion site. I am doubtful that a pouch will be able to maintain a seal related to the constant leakage, but I will attempt the process to contain the leakage.  Area surrounding SP tube insertion site is red and macerated to 1cm surrounding the location and is located in a deep crevice.  Applied barrier ring and cut one piece convex urostomy pouch to 1 1/4 inch opening and threaded the SP tube inside the pouch. Next time he will need a flexible urostomy pouch applied. Attached to bedside drainage bag.   Ileostomy pouch is leaking behind the barrier.  Previously noted to have a prolapsed stoma. 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 Output has decreased  at this time, mod amt liquid brown stool in pouch, so I will not reapply a high output pouch. Applied barrier ring and 2 piece pouching system, but patient will need flexible convex pouch with next change.  Patterns left in room and instructions provided for staff nurses use.  Ordered 5 sets of each supply to the bedside for staff nurse's use.  1. For ileostomy; use barrier ring [place] # [id number] and convex pouch Lawson # [id number] 2. For area surrounding SP catheter: barrier ring # [id number] and urostomy pouch [place] # 3, drainage adapter, Hart [place] # [id number].  MAKE SURE spout is turned so drop is showing before connecting to bedside drainage bag. Tuck SP cath tube inside pouch.   Use  Supplies: 2 and 1/4 inch supplies:  1. Ostomy skin barrier: Lawson # [id number] 2. Ostomy Pouch (High Output) Hart [place] # [id number], (Regular) Lawson # [id number]  2. Skin barrier ring [place] # [phone] WOC team will see weekly while in the critical care setting and begin teaching sessions when stable and out of ICU.  Enrolled patient in [place]  Secure Start Discharge program: NOT YET. Thank-you,  Cammie Mcgee MSN, RN, CWOCN, [place], CNS [phone]

## 2022-12-12 NOTE — Progress Notes (Addendum)
NAME:  Gregory [name], MRN:  [id number], DOB:  Dec 02, 1960, LOS: 9 ADMISSION DATE:  12/03/2022, CONSULTATION DATE:  12/12/2022 REFERRING MD:  EDP, CHIEF COMPLAINT:  abdominal pain   History of Present Illness:  Gregory [name] is a 62 y.o. man with COPD on home oxygen and DM2 s/p RLE AKA who is a SNF resident at [facility] with frequent admissions to the hospital.   6/17 Presents to ER with abdominal pain, urinary incontinence, and hypoxia requiring 4L [place]. Lactic Acid 3, given fluid bolus and repeat lactic acid 4. CT with concerns of ileus with dilation of large intestine with stool balls in distal colon. Surgery consulted given rising lactic acid and concern for mesenteric ischemia. Plan to given enema to clear stool burden with frequent serial examinations. Admitted with critical care. WBC 28.7. Started on Vancomycin/Zosyn.   6/18 with worsening lactic acidosis and physical examination, decision made to proceed to OR for exploration. 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 Post-operative setting presented to ICU intubated with hypotension requiring NEO, EPI, Levophed, and vasopressin, with bicarb gtt. 6/19 vasopressor requirements improvement. Patient extubated.   Pertinent  Medical History  S/p RLE AKA DM2 COPD on home oxygen  Significant Hospital Events: Including procedures, antibiotic start and stop dates in addition to other pertinent events   6/17 admitted, s/p exploratory laparotomy for total colectomy due to ischemic colon 6/17 urine>> EColi>> 6/19 > extubated. A.Fib RVR - re intubated that night 6/22 developed A-fib RVR overnight resulting in need for amiodarone drip and reinitiation of Levophed.   6/23 Required addition amiodarone bolus overnight, NSR on am exam  6/24 off pressors 6/25 ketamine stopped  Interim History / Subjective:  Remains on dilaudid gtt.  On PS.  Tm 102.2.  Objective   Blood pressure (!) 135/53, pulse 76,  temperature (!) 100.6 F (38.1 C), resp. rate 14, weight 101.4 kg, SpO2 100 %.    Vent Mode: PSV;CPAP FiO2 (%):  [40 %] 40 % Set Rate:  [18 bmp] 18 bmp Vt Set:  [620 mL] 620 mL PEEP:  [5 cmH20] 5 cmH20 Pressure Support:  [10 cmH20] 10 cmH20 Plateau Pressure:  [12 cmH20-15 cmH20] 15 cmH20   Intake/Output Summary (Last 24 hours) at 12/12/2022 0747 Last data filed at 12/12/2022 0600 Gross per 24 hour  Intake 2199.09 ml  Output 3230 ml  Net -1030.91 ml   Filed Weights   12/10/22 0103 12/11/22 0500 12/12/22 0338  Weight: 101.8 kg 101.4 kg 101.4 kg   Physical Exam:  General - sedated Eyes - pupils reactive ENT - ETT in place Cardiac - regular rate/rhythm, no murmur Chest - equal breath sounds b/l, no wheezing or rales Abdomen - soft, ileostomy in place, wound dressing clean Extremities - Rt AKA, Lt foot in a wrap Skin - no rashes Neuro - RASS 0  Resolved Hospital Problem list   Anion Gap Metabolic Acidosis with Lactic Acidosis, Shock from sepsis with E coli UTI and ischemic colitis  Assessment & Plan:   Acute hypoxic respiratory failure. Aspiration pneumonitis. COPD. 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 - post op care per surgery  Paroxysmal atrial fibrillation in setting of acute stress. Hx of HTN, HLD, PAD. - normal EF on Echo from 6/20 -  back in sinus rhythm - continue amiodarone, coreg, hydralazine, ASA, lipitor  AKI from ATN 2nd to sepsis. CKD 3b. - baseline creatinine 1.60 from 09/21/22 - f/u BMET, monitor urine outpt  Anemia of critical illness and iron deficiency. - f/u CBC - transfuse for Hb < 7 - check iron levels  DM type 2 poorly controlled with hyperglycemia. - SSI - increase semglee to 4 units bid  BPH. - flomax  Acute metabolic  encephalopathy 2nd to sepsis. Chronic neuropathic pain. - dilaudid gtt for RASS goal 0 - methadone started 6/25 >> titrate this up as needed to wean off dilaudid gtt and oxycodone - continue lexapro, neurontin, lidoderm patch, robaxin, seroquel  Best Practice (right click and "Reselect all SmartList Selections" daily)   Diet/type: tubefeeds DVT prophylaxis: prophylactic heparin  GI prophylaxis: PPI Lines: Central line Foley:  Yes, and it is still needed Code Status:  full code Last date of multidisciplinary goals of care discussion [ 6/24 ] Updated daughter on above  Critical care time: 36 minutes  Coralyn Helling, MD [facility] Pulmonary/Critical Care Pager - [phone] or [phone] 12/12/2022, 8:07 AM

## 2022-12-12 NOTE — Progress Notes (Signed)
50mg  Dilaudid infusion wasted with Barbaraann Barthel RN

## 2022-12-12 NOTE — Progress Notes (Signed)
Patient appears stable on chart check.  Ileostomy output around 1.5L.  will increase imodium to 2mg  QID and increase fiber to TID. Cont dressing changes and TFs.  Will see tomorrow.  Gregory [name] 9:41 AM 12/12/2022

## 2022-12-13 ENCOUNTER — Inpatient Hospital Stay (HOSPITAL_COMMUNITY): Payer: Medicaid Other

## 2022-12-13 DIAGNOSIS — J9601 Acute respiratory failure with hypoxia: Secondary | ICD-10-CM | POA: Diagnosis not present

## 2022-12-13 LAB — CBC
HCT: 25.2 % — ABNORMAL LOW (ref 39.0–52.0)
Hemoglobin: 7.3 g/dL — ABNORMAL LOW (ref 13.0–17.0)
MCH: 28.5 pg (ref 26.0–34.0)
MCHC: 29 g/dL — ABNORMAL LOW (ref 30.0–36.0)
MCV: 98.4 fL (ref 80.0–100.0)
Platelets: 366 10*3/uL (ref 150–400)
RBC: 2.56 MIL/uL — ABNORMAL LOW (ref 4.22–5.81)
RDW: 15.9 % — ABNORMAL HIGH (ref 11.5–15.5)
WBC: 12.5 10*3/uL — ABNORMAL HIGH (ref 4.0–10.5)
nRBC: 0 % (ref 0.0–0.2)

## 2022-12-13 LAB — BASIC METABOLIC PANEL
Anion gap: 7 (ref 5–15)
BUN: 60 mg/dL — ABNORMAL HIGH (ref 8–23)
CO2: 24 mmol/L (ref 22–32)
Calcium: 7.9 mg/dL — ABNORMAL LOW (ref 8.9–10.3)
Chloride: 112 mmol/L — ABNORMAL HIGH (ref 98–111)
Creatinine, Ser: 2.27 mg/dL — ABNORMAL HIGH (ref 0.61–1.24)
GFR, Estimated: 32 mL/min — ABNORMAL LOW (ref >60)
Glucose, Bld: 194 mg/dL — ABNORMAL HIGH (ref 70–99)
Potassium: 4.8 mmol/L (ref 3.5–5.1)
Sodium: 143 mmol/L (ref 135–145)

## 2022-12-13 LAB — GLUCOSE, CAPILLARY
Glucose-Capillary: 187 mg/dL — ABNORMAL HIGH (ref 70–99)
Glucose-Capillary: 184 mg/dL — ABNORMAL HIGH (ref 70–99)
Glucose-Capillary: 185 mg/dL — ABNORMAL HIGH (ref 70–99)
Glucose-Capillary: 175 mg/dL — ABNORMAL HIGH (ref 70–99)
Glucose-Capillary: 155 mg/dL — ABNORMAL HIGH (ref 70–99)
Glucose-Capillary: 100 mg/dL — ABNORMAL HIGH (ref 70–99)

## 2022-12-13 LAB — MAGNESIUM: Magnesium: 2 mg/dL (ref 1.7–2.4)

## 2022-12-13 LAB — PHOSPHORUS: Phosphorus: 4.3 mg/dL (ref 2.5–4.6)

## 2022-12-13 MED ORDER — ACETAMINOPHEN 500 MG PO TABS
1000.0000 mg | ORAL_TABLET | Freq: Four times a day (QID) | ORAL | Status: DC
  Administered 2022-12-13 – 2022-12-15 (×7): 1000 mg via ORAL
  Filled 2022-12-13 (×7): qty 2

## 2022-12-13 MED ORDER — NUTRISOURCE FIBER PO PACK
1.0000 | PACK | Freq: Three times a day (TID) | ORAL | Status: DC
  Administered 2022-12-13: 1 via ORAL
  Filled 2022-12-13: qty 1

## 2022-12-13 MED ORDER — ATORVASTATIN CALCIUM 80 MG PO TABS
80.0000 mg | ORAL_TABLET | Freq: Every day | ORAL | Status: DC
  Administered 2022-12-14 – 2022-12-25 (×12): 80 mg via ORAL
  Filled 2022-12-13 (×12): qty 1

## 2022-12-13 MED ORDER — GABAPENTIN 100 MG PO CAPS
200.0000 mg | ORAL_CAPSULE | Freq: Three times a day (TID) | ORAL | Status: DC
  Administered 2022-12-13 – 2022-12-25 (×36): 200 mg via ORAL
  Filled 2022-12-13 (×38): qty 2

## 2022-12-13 MED ORDER — DIPHENHYDRAMINE HCL 50 MG/ML IJ SOLN: 12.5000 mg | Freq: Four times a day (QID) | INTRAMUSCULAR | Status: DC | PRN

## 2022-12-13 MED ORDER — LOPERAMIDE HCL 2 MG PO CAPS
2.0000 mg | ORAL_CAPSULE | Freq: Four times a day (QID) | ORAL | Status: DC
  Administered 2022-12-13 – 2022-12-19 (×23): 2 mg via ORAL
  Filled 2022-12-13 (×23): qty 1

## 2022-12-13 MED ORDER — QUETIAPINE FUMARATE 100 MG PO TABS
100.0000 mg | ORAL_TABLET | Freq: Every day | ORAL | Status: DC
  Administered 2022-12-13 – 2022-12-24 (×12): 100 mg via ORAL
  Filled 2022-12-13 (×12): qty 1

## 2022-12-13 MED ORDER — CARVEDILOL 12.5 MG PO TABS
12.5000 mg | ORAL_TABLET | Freq: Two times a day (BID) | ORAL | Status: DC
  Administered 2022-12-13 – 2022-12-25 (×22): 12.5 mg via ORAL
  Filled 2022-12-13 (×22): qty 1

## 2022-12-13 MED ORDER — HYDRALAZINE HCL 10 MG PO TABS
10.0000 mg | ORAL_TABLET | Freq: Three times a day (TID) | ORAL | Status: DC
  Administered 2022-12-13 – 2022-12-25 (×35): 10 mg via ORAL
  Filled 2022-12-13 (×39): qty 1

## 2022-12-13 MED ORDER — OXYCODONE HCL 5 MG PO TABS
20.0000 mg | ORAL_TABLET | Freq: Four times a day (QID) | ORAL | Status: DC | PRN
  Administered 2022-12-14 – 2022-12-15 (×3): 20 mg via ORAL
  Filled 2022-12-13 (×3): qty 4

## 2022-12-13 MED ORDER — NALOXONE HCL 0.4 MG/ML IJ SOLN: 0.4000 mg | INTRAMUSCULAR | Status: DC | PRN

## 2022-12-13 MED ORDER — ONDANSETRON HCL 4 MG/2ML IJ SOLN: 4.0000 mg | Freq: Four times a day (QID) | INTRAMUSCULAR | Status: DC | PRN

## 2022-12-13 MED ORDER — AMIODARONE HCL 200 MG PO TABS
200.0000 mg | ORAL_TABLET | Freq: Every day | ORAL | Status: DC
  Administered 2022-12-14 – 2022-12-25 (×12): 200 mg via ORAL
  Filled 2022-12-13 (×12): qty 1

## 2022-12-13 MED ORDER — METHOCARBAMOL 500 MG PO TABS
1000.0000 mg | ORAL_TABLET | Freq: Three times a day (TID) | ORAL | Status: DC
  Administered 2022-12-13 – 2022-12-25 (×33): 1000 mg via ORAL
  Filled 2022-12-13 (×35): qty 2

## 2022-12-13 MED ORDER — SODIUM CHLORIDE 0.9 % IV SOLN
250.0000 mg | Freq: Every day | INTRAVENOUS | Status: AC
  Administered 2022-12-13 – 2022-12-16 (×4): 250 mg via INTRAVENOUS
  Filled 2022-12-13: qty 250
  Filled 2022-12-13 (×2): qty 20
  Filled 2022-12-13: qty 250

## 2022-12-13 MED ORDER — ESCITALOPRAM OXALATE 20 MG PO TABS
20.0000 mg | ORAL_TABLET | Freq: Every day | ORAL | Status: DC
  Administered 2022-12-14 – 2022-12-25 (×12): 20 mg via ORAL
  Filled 2022-12-13 (×8): qty 1
  Filled 2022-12-13: qty 2
  Filled 2022-12-13 (×2): qty 1
  Filled 2022-12-13: qty 2
  Filled 2022-12-13: qty 1

## 2022-12-13 MED ORDER — SODIUM CHLORIDE 0.9% FLUSH: 9.0000 mL | INTRAVENOUS | Status: DC | PRN

## 2022-12-13 MED ORDER — METHADONE HCL 10 MG PO TABS
10.0000 mg | ORAL_TABLET | Freq: Three times a day (TID) | ORAL | Status: DC
  Administered 2022-12-13 – 2022-12-20 (×19): 10 mg via ORAL
  Filled 2022-12-13 (×20): qty 1

## 2022-12-13 MED ORDER — ORAL CARE MOUTH RINSE
15.0000 mL | Freq: Three times a day (TID) | OROMUCOSAL | Status: DC
  Administered 2022-12-13 – 2022-12-25 (×31): 15 mL via OROMUCOSAL

## 2022-12-13 MED ORDER — OXYCODONE HCL 5 MG PO TABS
20.0000 mg | ORAL_TABLET | ORAL | Status: DC
  Administered 2022-12-13 – 2022-12-16 (×16): 20 mg via ORAL
  Filled 2022-12-13 (×16): qty 4

## 2022-12-13 MED ORDER — HYDROMORPHONE 1 MG/ML IV SOLN
INTRAVENOUS | Status: DC
  Administered 2022-12-13: 30 mg via INTRAVENOUS
  Administered 2022-12-13: 1.2 mg via INTRAVENOUS

## 2022-12-13 MED ORDER — DIPHENHYDRAMINE HCL 12.5 MG/5ML PO ELIX: 12.5000 mg | ORAL_SOLUTION | Freq: Four times a day (QID) | ORAL | Status: DC | PRN

## 2022-12-13 MED ORDER — ASPIRIN 81 MG PO TBEC
81.0000 mg | DELAYED_RELEASE_TABLET | Freq: Every day | ORAL | Status: DC
  Administered 2022-12-14 – 2022-12-25 (×12): 81 mg via ORAL
  Filled 2022-12-13 (×12): qty 1

## 2022-12-13 NOTE — Progress Notes (Signed)
Pharmacy to dose IV Iron  H&H 7.3/25.2  Iron < 5 TIBC 132  Plan: Ferric Gluconate 250 mg daily x 4 days  Elmer Sow, PharmD, BCCCP Clinical Pharmacist  Please check AMION for all [facility] Pharmacy numbers  12/13/2022 8:55 AM

## 2022-12-13 NOTE — Inpatient Diabetes Management (Signed)
Inpatient Diabetes Program Recommendations  AACE/ADA: New Consensus Statement on Inpatient Glycemic Control (2015)  Target Ranges:  Prepandial:   less than 140 mg/dL      Peak postprandial:   less than 180 mg/dL (1-2 hours)      Critically ill patients:  140 - 180 mg/dL   Lab Results  Component Value Date   GLUCAP 184 (H) 12/13/2022   HGBA1C 6.9 (H) 12/03/2022    Review of Glycemic Control  Latest Reference Range & Units 12/12/22 23:57 12/13/22 03:54 12/13/22 07:17  Glucose-Capillary 70 - 99 mg/dL 161 (H) 096 (H) 045 (H)   Diabetes history: DM 2 Outpatient Diabetes medications: Lantus 10 units bid, Humalog 0-10 units tid with meals Current orders for Inpatient glycemic control:  Semglee 4 units bid Novolog 0-15 units q 4 hours Vital 65 ml/hr  Inpatient Diabetes Program Recommendations:    Blood sugars slightly>goal.  Consider adding Novolog tube feed coverage 2 units q 4 hours.   Thanks,  Beryl Meager, RN, BC-ADM Inpatient Diabetes Coordinator Pager [phone]  (8a-5p)

## 2022-12-13 NOTE — Progress Notes (Signed)
eLink Physician-Brief Progress Note Patient Name: Gregory [name] DOB: 1960/11/01 MRN: [id number]   Date of Service  12/13/2022  HPI/Events of Note  Patient continues to be febrile despite being on scheduled Tylenol.  eICU Interventions  Blood cultures x 2 ordered.  Chest x-ray already ordered for this morning.  Will follow-up on chest x-ray findings.  Continue to monitor.     Intervention Category Evaluation Type: Other (Persistent fever)  Carilyn Goodpasture 12/13/2022, 5:10 AM

## 2022-12-13 NOTE — Procedures (Signed)
Extubation Procedure Note  Patient Details:   Name: Gregory [name] DOB: Nov 14, 1960 MRN: [id number]   Airway Documentation:    Vent end date: 12/05/22 Vent end time: 1052   Evaluation  O2 sats: stable throughout Complications: No apparent complications Patient did tolerate procedure well. Bilateral Breath Sounds: Clear, Diminished   Yes Pt was successfullt extubated with no apparent complications. Audible cuffleak was heard prior extubation and no signs of stridor at this time. Pt was able to lift head forward and stick tongue out and is able to perform strong cough. Pt is able to talk and state name. Currently Pt is stable on 2L [place]. RT will be available as needed.  Jolayne Panther 12/13/2022, 8:38 AM

## 2022-12-13 NOTE — Progress Notes (Addendum)
NAME:  Gregory [name], MRN:  [id number], DOB:  1961-01-30, LOS: 10 ADMISSION DATE:  12/03/2022, CONSULTATION DATE:  12/13/2022 REFERRING MD:  EDP, CHIEF COMPLAINT:  abdominal pain   History of Present Illness:  Gregory [name] is a 62 y.o. man with COPD on home oxygen and DM2 s/p RLE AKA who is a SNF resident at [facility] with frequent admissions to the hospital.   6/17 Presents to ER with abdominal pain, urinary incontinence, and hypoxia requiring 4L [place]. Lactic Acid 3, given fluid bolus and repeat lactic acid 4. CT with concerns of ileus with dilation of large intestine with stool balls in distal colon. Surgery consulted given rising lactic acid and concern for mesenteric ischemia. Plan to given enema to clear stool burden with frequent serial examinations. Admitted with critical care. WBC 28.7. Started on Vancomycin/Zosyn.   6/18 with worsening lactic acidosis and physical examination, decision made to proceed to OR for exploration. 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 Post-operative setting presented to ICU intubated with hypotension requiring NEO, EPI, Levophed, and vasopressin, with bicarb gtt. 6/19 vasopressor requirements improvement. Patient extubated.   Pertinent  Medical History  S/p RLE AKA DM2 COPD on home oxygen  Significant Hospital Events: Including procedures, antibiotic start and stop dates in addition to other pertinent events   6/17 admitted, s/p exploratory laparotomy for total colectomy due to ischemic colon 6/17 urine>> EColi>> 6/19 > extubated. A.Fib RVR - re intubated that night 6/22 developed A-fib RVR overnight resulting in need for amiodarone drip and reinitiation of Levophed.   6/23 Required addition amiodarone bolus overnight, NSR on am exam  6/24 off pressors 6/25 ketamine stopped 6/27 Extubate 6/27  Interim History / Subjective:  Tolerating pressure support.  Objective   Blood pressure (!) 133/53, pulse 90,  temperature 99.1 F (37.3 C), temperature source Oral, resp. rate 19, height 6' (1.829 m), weight 97.8 kg, SpO2 96 %.    Vent Mode: PSV;CPAP FiO2 (%):  [40 %] 40 % Set Rate:  [15 bmp] 15 bmp Vt Set:  [650 mL] 650 mL PEEP:  [5 cmH20] 5 cmH20 Pressure Support:  [5 cmH20-10 cmH20] 5 cmH20 Plateau Pressure:  [17 cmH20] 17 cmH20   Intake/Output Summary (Last 24 hours) at 12/13/2022 0825 Last data filed at 12/13/2022 0600 Gross per 24 hour  Intake 1555.62 ml  Output 3175 ml  Net -1619.38 ml   Filed Weights   12/11/22 0500 12/12/22 0338 12/13/22 0450  Weight: 101.4 kg 101.4 kg 97.8 kg   Physical Exam:  General - alert Eyes - pupils reactive ENT - ETT in place Cardiac - regular rate/rhythm, no murmur Chest - equal breath sounds b/l, no wheezing or rales Abdomen - wound site clean, ostomy in place Extremities - Rt AKA, Lt foot in a wrap Skin - no rashes Neuro - normal strength, moves extremities, follows commands  Resolved Hospital Problem list   Anion Gap Metabolic Acidosis with Lactic Acidosis, Shock from sepsis with E coli UTI and ischemic colitis  Assessment & Plan:   Acute hypoxic respiratory failure. Aspiration pneumonitis. COPD. 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 - post op care, nutrition per surgery  Paroxysmal atrial fibrillation in setting of acute stress. Hx of HTN, HLD, PAD. - normal EF on Echo from 6/20 - back in sinus rhythm - continue amiodarone, coreg, hydralazine, ASA, lipitor  AKI from ATN 2nd to sepsis. CKD 3b. - baseline creatinine 1.60 from 09/21/22 - f/u BMET, monitor urine outpt with high ostomy outpt  Anemia of critical illness and iron deficiency. - f/u CBC - transfuse for Hb < 7 - pharmacy to dose IV iron  DM type 2 poorly controlled with hyperglycemia. - SSI - continue semglee 4 units bid  BPH. - flomax  Acute  metabolic encephalopathy 2nd to sepsis. Chronic neuropathic pain. - change to dilaudid PCA after extubation - methadone started 6/25 >> titrate this up as needed to wean off dilaudid gtt and oxycodone - continue lexapro, neurontin, lidoderm patch, robaxin, seroquel  Best Practice (right click and "Reselect all SmartList Selections" daily)   Diet/type: tubefeeds DVT prophylaxis: prophylactic heparin  GI prophylaxis: PPI Lines: Central line Foley:  Yes, and it is still needed Code Status:  full code Last date of multidisciplinary goals of care discussion [ 6/24 ] Updated daughter on above  Labs:      Latest Ref Rng & Units 12/13/2022    4:05 AM 12/12/2022    2:34 AM 12/11/2022    4:05 AM  CMP  Glucose 70 - 99 mg/dL 161  096  045   BUN 8 - 23 mg/dL 60  59  49   Creatinine 0.61 - 1.24 mg/dL 4.09  8.11  9.14   Sodium 135 - 145 mmol/L 143  147  143   Potassium 3.5 - 5.1 mmol/L 4.8  4.6  3.9   Chloride 98 - 111 mmol/L 112  111  110   CO2 22 - 32 mmol/L 24  24  25    Calcium 8.9 - 10.3 mg/dL 7.9  8.2  7.8       Latest Ref Rng & Units 12/13/2022    4:05 AM 12/12/2022    2:34 AM 12/11/2022    4:05 AM  CBC  WBC 4.0 - 10.5 K/uL 12.5  11.7  10.7   Hemoglobin 13.0 - 17.0 g/dL 7.3  7.4  7.6   Hematocrit 39.0 - 52.0 % 25.2  25.7  26.0   Platelets 150 - 400 K/uL 366  271  189     ABG    Component Value Date/Time   PHART 7.426 12/08/2022 1315   PCO2ART 43.0 12/08/2022 1315   PO2ART 80 (L) 12/08/2022 1315   HCO3 28.2 (H) 12/08/2022 1315   TCO2 29 12/08/2022 1315   ACIDBASEDEF 7.0 (H) 12/04/2022 0601   O2SAT 96 12/08/2022 1315    CBG (last 3)  Recent Labs    12/12/22 2357 12/13/22 0354 12/13/22 0717  GLUCAP 191* 187* 184*    Critical care time: 34 minutes  Coralyn Helling, MD [place] [facility]/Critical [facility] Pager - [phone] or [phone] 12/13/2022, 8:25 AM

## 2022-12-13 NOTE — TOC Progression Note (Signed)
Transition of Care [facility]) - Initial/Assessment Note    Patient Details  Name: Gregory [name] MRN: [id number] Date of Birth: August 20, 1960  Transition of Care [facility]) CM/SW Contact:    Ralene Bathe, LCSW Phone Number: 12/13/2022, 1:21 PM  Clinical Narrative:                 TOC following patient for d/c planning needs once medically stable. The patient was long term at [facility]) and can return when medically ready.   Cleon Gustin, MSW, LCSW        Patient Goals and CMS Choice            Expected Discharge Plan and Services                                              Prior Living Arrangements/Services                       Activities of Daily Living      Permission Sought/Granted                  Emotional Assessment              Admission diagnosis:  Sepsis [facility]) [A41.9] Patient Active Problem List   Diagnosis Date Noted   Occipital stroke (HCC) 11/27/2022   Skin ulcer of left ankle, limited to breakdown of skin (HCC) 09/19/2022   Stroke-like symptom 09/18/2022   Diabetic ulcer of lower leg (HCC) 09/18/2022   Constipation 09/18/2022   Shortness of breath 09/07/2022   Symptomatic anemia 09/05/2022   Chronic hypoxic respiratory failure (HCC) 09/05/2022   Respiratory failure (HCC) 08/22/2022   Pressure injury of skin 08/22/2022   Influenza A 08/22/2022   Sepsis (HCC) 08/22/2022   Pneumonia 05/01/2022   Right upper lobe pneumonia 09/05/2021   Hyponatremia 09/05/2021   Anxiety 09/05/2021   BPH (benign prostatic hyperplasia) 09/05/2021   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   Asymptomatic bacteriuria 06/30/2021   Normocytic anemia 06/29/2021   Pain of amputation stump of right lower extremity (HCC) 03/15/2021   Melanoma of cheek (HCC) 10/15/2019   Acute hypoxic respiratory failure (HCC) 07/11/2019   Acute respiratory disease due to COVID-19 virus 07/11/2019   Wound infection 06/22/2019   Abscess 06/22/2019   CKD  (chronic kidney disease), stage III (HCC) 05/23/2019   Tobacco abuse 05/23/2019   Cellulitis 05/23/2019   S/P AKA (above knee amputation) unilateral, right (HCC) 12/05/2018   Amputation stump infection (HCC)    Lymphedema of left leg    Diabetic polyneuropathy associated with type 2 diabetes mellitus (HCC)    Above knee amputation of right lower extremity (HCC) 10/22/2018   Dehiscence of amputation stump (HCC)    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   Candidiasis 08/12/2018   Necrosis of toe (HCC) 07/08/2018   Amputated toe of left foot (HCC) 06/18/2018   Streptococcal infection    Pressure injury, stage 3 (HCC) 06/06/2018   Cutaneous abscess of left foot    Moderate protein-calorie malnutrition (HCC)    Cellulitis of left lower extremity    Septic arthritis (HCC) 06/03/2018   Idiopathic chronic venous hypertension of left lower extremity with ulcer and inflammation (HCC) 11/14/2017   Great toe amputation status, left 11/14/2017   Enlarged lymph node in neck 07/18/2017   Malignant melanoma  of face (HCC) 04/16/2017   Osteomyelitis (HCC) 03/25/2017   Skin cancer 03/25/2017   Depression 11/05/2016   Diabetic ulcer of toe of left foot (HCC) 10/02/2016   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   Chronic pain 09/27/2016   DM2 (diabetes mellitus, type 2) (HCC) 06/06/2016   GERD without esophagitis 05/11/2016   Hypertensive heart disease with congestive heart failure (HCC) 12/15/2015   Spinal stenosis in cervical region 12/15/2015   Type II diabetes mellitus with neurological manifestations (HCC) 12/15/2015   Chronic diastolic HF (heart failure) (HCC)    Pulmonary hypertension (HCC)    HTN (hypertension)    HLD (hyperlipidemia)    Urinary retention    Diskitis    Foot drop, bilateral    Sepsis with acute renal failure without septic shock (HCC)    COPD (chronic obstructive pulmonary disease) (HCC) 10/03/2015   Acute osteomyelitis of left foot (HCC) 10/03/2015   MRSA  bacteremia 08/13/2015   Chronic low back pain 08/13/2015   Chronic multifocal osteomyelitis, multiple sites (HCC) 08/13/2015   Community acquired pneumonia 08/13/2015   PCP:  Sherron Monday, MD Pharmacy:   [facility] - [place], [place] - [address] [place] [address] Phone: [phone] Fax: [phone]     Social Determinants of Health (SDOH) Social History: SDOH Screenings   Food Insecurity: No Food Insecurity (09/18/2022)  Housing: Low Risk  (09/18/2022)  Transportation Needs: No Transportation Needs (09/18/2022)  Utilities: Not At Risk (09/18/2022)  Depression (PHQ2-9): Low Risk  (03/08/2021)  Tobacco Use: High Risk (12/04/2022)   SDOH Interventions:     Readmission Risk Interventions    09/06/2022    1:48 PM 08/27/2022   12:46 PM 05/03/2022   11:53 AM  Readmission Risk Prevention Plan  Transportation Screening Complete Complete Complete  Medication Review [employer]) Complete Complete Complete  PCP or Specialist appointment within 3-5 days of discharge Complete Complete Complete  HRI or Home Care Consult Complete Complete Complete  SW Recovery Care/Counseling Consult Complete Complete Complete  Palliative Care Screening Not Applicable Not Applicable Not Applicable  Skilled Nursing Facility Complete Complete Complete

## 2022-12-14 ENCOUNTER — Ambulatory Visit (HOSPITAL_BASED_OUTPATIENT_CLINIC_OR_DEPARTMENT_OTHER): Payer: Medicaid Other | Admitting: Internal Medicine

## 2022-12-14 DIAGNOSIS — K559 Vascular disorder of intestine, unspecified: Secondary | ICD-10-CM | POA: Diagnosis not present

## 2022-12-14 DIAGNOSIS — J690 Pneumonitis due to inhalation of food and vomit: Secondary | ICD-10-CM

## 2022-12-14 LAB — GLUCOSE, CAPILLARY
Glucose-Capillary: 97 mg/dL (ref 70–99)
Glucose-Capillary: 95 mg/dL (ref 70–99)
Glucose-Capillary: 127 mg/dL — ABNORMAL HIGH (ref 70–99)
Glucose-Capillary: 108 mg/dL — ABNORMAL HIGH (ref 70–99)
Glucose-Capillary: 125 mg/dL — ABNORMAL HIGH (ref 70–99)

## 2022-12-14 LAB — BASIC METABOLIC PANEL
Anion gap: 16 — ABNORMAL HIGH (ref 5–15)
Anion gap: 8 (ref 5–15)
Anion gap: 14 (ref 5–15)
BUN: 54 mg/dL — ABNORMAL HIGH (ref 8–23)
BUN: 56 mg/dL — ABNORMAL HIGH (ref 8–23)
BUN: 56 mg/dL — ABNORMAL HIGH (ref 8–23)
CO2: 20 mmol/L — ABNORMAL LOW (ref 22–32)
CO2: 18 mmol/L — ABNORMAL LOW (ref 22–32)
CO2: 22 mmol/L (ref 22–32)
Calcium: 8.7 mg/dL — ABNORMAL LOW (ref 8.9–10.3)
Calcium: 8 mg/dL — ABNORMAL LOW (ref 8.9–10.3)
Calcium: 8.5 mg/dL — ABNORMAL LOW (ref 8.9–10.3)
Chloride: 105 mmol/L (ref 98–111)
Chloride: 110 mmol/L (ref 98–111)
Chloride: 104 mmol/L (ref 98–111)
Creatinine, Ser: 2.07 mg/dL — ABNORMAL HIGH (ref 0.61–1.24)
Creatinine, Ser: 2.04 mg/dL — ABNORMAL HIGH (ref 0.61–1.24)
Creatinine, Ser: 2.05 mg/dL — ABNORMAL HIGH (ref 0.61–1.24)
GFR, Estimated: 35 mL/min — ABNORMAL LOW (ref >60)
GFR, Estimated: 36 mL/min — ABNORMAL LOW (ref >60)
GFR, Estimated: 36 mL/min — ABNORMAL LOW (ref >60)
Glucose, Bld: 110 mg/dL — ABNORMAL HIGH (ref 70–99)
Glucose, Bld: 130 mg/dL — ABNORMAL HIGH (ref 70–99)
Glucose, Bld: 150 mg/dL — ABNORMAL HIGH (ref 70–99)
Potassium: 5.7 mmol/L — ABNORMAL HIGH (ref 3.5–5.1)
Potassium: 7 mmol/L (ref 3.5–5.1)
Potassium: 5.6 mmol/L — ABNORMAL HIGH (ref 3.5–5.1)
Sodium: 141 mmol/L (ref 135–145)
Sodium: 136 mmol/L (ref 135–145)
Sodium: 140 mmol/L (ref 135–145)

## 2022-12-14 LAB — CBC
HCT: 25.7 % — ABNORMAL LOW (ref 39.0–52.0)
Hemoglobin: 7.3 g/dL — ABNORMAL LOW (ref 13.0–17.0)
MCH: 27.5 pg (ref 26.0–34.0)
MCHC: 28.4 g/dL — ABNORMAL LOW (ref 30.0–36.0)
MCV: 97 fL (ref 80.0–100.0)
Platelets: 391 10*3/uL (ref 150–400)
RBC: 2.65 MIL/uL — ABNORMAL LOW (ref 4.22–5.81)
RDW: 15.4 % (ref 11.5–15.5)
WBC: 12.2 10*3/uL — ABNORMAL HIGH (ref 4.0–10.5)
nRBC: 0 % (ref 0.0–0.2)

## 2022-12-14 LAB — POCT I-STAT 7, (LYTES, BLD GAS, ICA,H+H)
Acid-base deficit: 3 mmol/L — ABNORMAL HIGH (ref 0.0–2.0)
Bicarbonate: 21.7 mmol/L (ref 20.0–28.0)
Calcium, Ion: 1.26 mmol/L (ref 1.15–1.40)
HCT: 23 % — ABNORMAL LOW (ref 39.0–52.0)
Hemoglobin: 7.8 g/dL — ABNORMAL LOW (ref 13.0–17.0)
O2 Saturation: 89 %
Patient temperature: 98.9
Potassium: 5.5 mmol/L — ABNORMAL HIGH (ref 3.5–5.1)
Sodium: 138 mmol/L (ref 135–145)
TCO2: 23 mmol/L (ref 22–32)
pCO2 arterial: 36.4 mmHg (ref 32–48)
pH, Arterial: 7.384 (ref 7.35–7.45)
pO2, Arterial: 57 mmHg — ABNORMAL LOW (ref 83–108)

## 2022-12-14 MED ORDER — ADULT MULTIVITAMIN W/MINERALS CH
1.0000 | ORAL_TABLET | Freq: Every day | ORAL | Status: DC
  Administered 2022-12-14 – 2022-12-25 (×12): 1 via ORAL
  Filled 2022-12-14 (×12): qty 1

## 2022-12-14 MED ORDER — INSULIN ASPART 100 UNIT/ML IJ SOLN: 0.0000 [IU] | Freq: Every day | INTRAMUSCULAR | Status: DC

## 2022-12-14 MED ORDER — SODIUM ZIRCONIUM CYCLOSILICATE 10 G PO PACK
10.0000 g | PACK | Freq: Three times a day (TID) | ORAL | Status: AC
  Administered 2022-12-14 – 2022-12-16 (×6): 10 g via ORAL
  Filled 2022-12-14 (×7): qty 1

## 2022-12-14 MED ORDER — JUVEN PO PACK
1.0000 | PACK | Freq: Two times a day (BID) | ORAL | Status: DC
  Administered 2022-12-14 – 2022-12-25 (×16): 1 via ORAL
  Filled 2022-12-14 (×18): qty 1

## 2022-12-14 MED ORDER — PANTOPRAZOLE SODIUM 40 MG PO TBEC
40.0000 mg | DELAYED_RELEASE_TABLET | Freq: Every day | ORAL | Status: DC
  Administered 2022-12-14 – 2022-12-24 (×11): 40 mg via ORAL
  Filled 2022-12-14 (×11): qty 1

## 2022-12-14 MED ORDER — SODIUM BICARBONATE 8.4 % IV SOLN
50.0000 meq | Freq: Once | INTRAVENOUS | Status: AC
  Administered 2022-12-14: 50 meq via INTRAVENOUS
  Filled 2022-12-14: qty 50

## 2022-12-14 MED ORDER — INSULIN ASPART 100 UNIT/ML IJ SOLN
0.0000 [IU] | Freq: Three times a day (TID) | INTRAMUSCULAR | Status: DC
  Administered 2022-12-14: 3 [IU] via SUBCUTANEOUS
  Administered 2022-12-15: 4 [IU] via SUBCUTANEOUS
  Administered 2022-12-17: 3 [IU] via SUBCUTANEOUS
  Administered 2022-12-17: 4 [IU] via SUBCUTANEOUS
  Administered 2022-12-18 – 2022-12-19 (×4): 3 [IU] via SUBCUTANEOUS
  Administered 2022-12-20 – 2022-12-21 (×2): 4 [IU] via SUBCUTANEOUS
  Administered 2022-12-22 – 2022-12-23 (×2): 3 [IU] via SUBCUTANEOUS
  Administered 2022-12-23: 4 [IU] via SUBCUTANEOUS
  Administered 2022-12-24: 3 [IU] via SUBCUTANEOUS

## 2022-12-14 MED ORDER — CALCIUM GLUCONATE-NACL 1-0.675 GM/50ML-% IV SOLN
1.0000 g | Freq: Once | INTRAVENOUS | Status: AC
  Administered 2022-12-14: 1000 mg via INTRAVENOUS
  Filled 2022-12-14: qty 50

## 2022-12-14 MED ORDER — SALINE SPRAY 0.65 % NA SOLN
1.0000 | NASAL | Status: DC | PRN
  Administered 2022-12-15 – 2022-12-21 (×2): 1 via NASAL
  Filled 2022-12-14: qty 44

## 2022-12-14 MED ORDER — ENSURE ENLIVE PO LIQD
237.0000 mL | Freq: Two times a day (BID) | ORAL | Status: DC
  Administered 2022-12-14 – 2022-12-17 (×4): 237 mL via ORAL

## 2022-12-14 NOTE — Progress Notes (Signed)
Nutrition Follow-up  DOCUMENTATION CODES:   Obesity unspecified  INTERVENTION:  Adjust diet to GI soft in setting of recent GI surgery for ease of digestion 1 packet Juven BID to support wound healing Ensure Enlive po BID, each supplement provides 350 kcal and 20 grams of protein. MVI with minerals daily "Ileostomy Nutrition Therapy Handout" added to AVS- will follow up on further education closed to discharge  NUTRITION DIAGNOSIS:   Increased nutrient needs related to wound healing, post-op healing as evidenced by estimated needs. - remains applicable  GOAL:   Patient will meet greater than or equal to 90% of their needs - progressing  MONITOR:   Vent status, Labs, Weight trends, TF tolerance, Skin, I & O's  REASON FOR ASSESSMENT:   Consult Enteral/tube feeding initiation and management (trickle tube feeds only, no advancement)  ASSESSMENT:   Pt with hx of DM type 2, COPD, HTN, HLD, CHF, GERD, hx CVA, and osteomyelitis s/p Right AKA presented to ED from SNF with severe abdominal pain. Workup in ED worrisome for bowel ischemia.  06/17 - admitted 06/18 - s/p exploratory laparotomy, total abdominal colectomy with end ileostomy, returned to ICU intubated 06/19 - extubated, trickle tube feeds started via NG tube, later reintubated 06/20 - TF held due to increased pressor requirements and overall worsening status, NG tube to LIWS 06/22 - trickle tube feeds resumed 06/23 - tube feeds advanced towards goal 06/27 - extubated, clear liquid diet 06/28 - diet advanced, adjusted to soft/low fiber diet   Spoke with pt at bedside. He reports having some stomach pain but feels this is more from surgery versus stomach upset. He denies nausea or vomiting. He recalls having an excellent appetite prior to admit. Has not consumed clear liquid trays as he does not like jello, ice pops or broth. He has been consuming juices and sodas and is looking forward to returning to a more solid diet.    Pt is agreeable to supplements to optimize wound and post-op healing.   Admit weight: 106.6 kg Current weight: 99.1 kg  Medications: SSI 0-15 units q4h, semglee 4 units BID, medihoney, imodium, protonix Drips: NaCl @ 43ml/hr, ferrlecit  Labs: potassium 5.7, BUN 54, Cr 2.07, anion gap 16, GFR 35, CBG's 95-175 x24 hours  Uop: x24 hours LUQ JP drain: 10ml x24 hours  Diet Order:   Diet Order             DIET SOFT Room service appropriate? Yes; Fluid consistency: Thin  Diet effective now                   EDUCATION NEEDS:   Not appropriate for education at this time  Skin:  Skin Assessment: Skin Integrity Issues: Skin Integrity Issues:: Stage II, Stage III, Incisions, Other (Comment) Stage II: R buttocks Stage III: L posterior thigh Incisions: abdomen Other: non-pressure wound (no location documented)  Last BM:  via ileostomy + 1 unmeasured occurrence x24 hours  Height:   Ht Readings from Last 1 Encounters:  12/12/22 6' (1.829 m)    Weight:   Wt Readings from Last 1 Encounters:  12/14/22 99.1 kg    Ideal Body Weight:  72.8 kg (adjusted by 10% for hx of R AKA)  BMI:  Body mass index is 29.63 kg/m.  Estimated Nutritional Needs:   Kcal:  2300-2500 kcal/d  Protein:  120-140g/d  Fluid:  2.2L/d  Drusilla Kanner, RDN, LDN Clinical Nutrition

## 2022-12-14 NOTE — Progress Notes (Addendum)
NAME:  Gregory [name], MRN:  [id number], DOB:  08-May-1961, LOS: 11 ADMISSION DATE:  12/03/2022, CONSULTATION DATE:  12/14/2022 REFERRING MD:  EDP, CHIEF COMPLAINT:  abdominal pain   History of Present Illness:  Gregory [name] is a 62 y.o. man with COPD on home oxygen and DM2 s/p RLE AKA who is a SNF resident at [facility] with frequent admissions to the hospital.   6/17 Presents to ER with abdominal pain, urinary incontinence, and hypoxia requiring 4L [place]. Lactic Acid 3, given fluid bolus and repeat lactic acid 4. CT with concerns of ileus with dilation of large intestine with stool balls in distal colon. Surgery consulted given rising lactic acid and concern for mesenteric ischemia. Plan to given enema to clear stool burden with frequent serial examinations. Admitted with critical care. WBC 28.7. Started on Vancomycin/Zosyn.   6/18 with worsening lactic acidosis and physical examination, decision made to proceed to OR for exploration. 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 Post-operative setting presented to ICU intubated with hypotension requiring NEO, EPI, Levophed, and vasopressin, with bicarb gtt. 6/19 vasopressor requirements improvement. Patient extubated.   Pertinent  Medical History  S/p RLE AKA DM2 COPD on home oxygen  Significant Hospital Events: Including procedures, antibiotic start and stop dates in addition to other pertinent events   6/17 admitted, s/p exploratory laparotomy for total colectomy due to ischemic colon 6/17 urine>> EColi>> 6/19 > extubated. A.Fib RVR - re intubated that night 6/22 developed A-fib RVR overnight resulting in need for amiodarone drip and reinitiation of Levophed.   6/23 Required addition amiodarone bolus overnight, NSR on am exam  6/24 off pressors 6/25 ketamine stopped 6/27 Extubate 6/27  Interim History / Subjective:  Breathing better.  Denies chest pain.  Abdominal pain controlled.  Not needing  PCA.  Objective   Blood pressure (!) 106/58, pulse 73, temperature 98.7 F (37.1 C), temperature source Axillary, resp. rate 20, height 6' (1.829 m), weight 99.1 kg, SpO2 96 %.        Intake/Output Summary (Last 24 hours) at 12/14/2022 0936 Last data filed at 12/14/2022 0700 Gross per 24 hour  Intake 3378.92 ml  Output 2480 ml  Net 898.92 ml   Filed Weights   12/12/22 0338 12/13/22 0450 12/14/22 0434  Weight: 101.4 kg 97.8 kg 99.1 kg   Physical Exam:  General - alert Eyes - pupils reactive ENT - no sinus tenderness, no stridor Cardiac - regular rate/rhythm, no murmur Chest - scattered rhonchi Abdomen - ostomy in place, wound dressing clean Extremities - Rt AKA, Lt foot in wrap Skin - no rashes Neuro - normal strength, moves extremities, follows commands  Resolved Hospital Problem list   Anion Gap Metabolic Acidosis with Lactic Acidosis, Shock from sepsis with E coli UTI and ischemic colitis, Acute hypoxic respiratory failure, Acute metabolic encephalopathy, Aspiration pneumonitis  Assessment & Plan:   COPD. 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 - post op care, nutrition per surgery  Paroxysmal atrial fibrillation in setting of acute stress. Hx of HTN, HLD, PAD. - normal EF on Echo from 6/20 - back in sinus rhythm - continue amiodarone, coreg, hydralazine, ASA, lipitor  AKI from ATN 2nd to sepsis. Hyperkalemia. CKD 3b. - baseline creatinine 1.60 from 09/21/22 - f/u BMET, monitor urine outpt with high ostomy outpt  Anemia of critical illness and iron deficiency. - f/u CBC - transfuse for Hb < 7 -  IV iron infusion on 6/27  DM type 2 poorly controlled with hyperglycemia. - SSI - continue semglee 4 units bid  BPH. - flomax  Chronic neuropathic pain. - dilaudid PCA d/c'ed - methadone started 6/25 >> titrate this up as needed to wean off  oxycodone - continue lexapro, neurontin,  lidoderm patch, robaxin, seroquel  Pressure injury. - buttock Rt, stage 2, POA - thigh Lt, stage 3, POA - wound care  Transfer to telemetry 6/28.  To [facility] 6/29 and PCCM off.  Best Practice (right click and "Reselect all SmartList Selections" daily)   Diet/type: Regular consistency (see orders) DVT prophylaxis: prophylactic heparin  GI prophylaxis: PPI Lines: N/A Foley:  N/A Code Status:  full code Last date of multidisciplinary goals of care discussion [ 6/24 ] Updated daughter on above  Labs:      Latest Ref Rng & Units 12/14/2022    1:15 AM 12/13/2022    4:05 AM 12/12/2022    2:34 AM  CMP  Glucose 70 - 99 mg/dL 161  096  045   BUN 8 - 23 mg/dL 54  60  59   Creatinine 0.61 - 1.24 mg/dL 4.09  8.11  9.14   Sodium 135 - 145 mmol/L 141  143  147   Potassium 3.5 - 5.1 mmol/L 5.7  4.8  4.6   Chloride 98 - 111 mmol/L 105  112  111   CO2 22 - 32 mmol/L 20  24  24    Calcium 8.9 - 10.3 mg/dL 8.7  7.9  8.2       Latest Ref Rng & Units 12/14/2022    1:15 AM 12/13/2022    4:05 AM 12/12/2022    2:34 AM  CBC  WBC 4.0 - 10.5 K/uL 12.2  12.5  11.7   Hemoglobin 13.0 - 17.0 g/dL 7.3  7.3  7.4   Hematocrit 39.0 - 52.0 % 25.7  25.2  25.7   Platelets 150 - 400 K/uL 391  366  271     ABG    Component Value Date/Time   PHART 7.426 12/08/2022 1315   PCO2ART 43.0 12/08/2022 1315   PO2ART 80 (L) 12/08/2022 1315   HCO3 28.2 (H) 12/08/2022 1315   TCO2 29 12/08/2022 1315   ACIDBASEDEF 7.0 (H) 12/04/2022 0601   O2SAT 96 12/08/2022 1315    CBG (last 3)  Recent Labs    12/13/22 2324 12/14/22 0330 12/14/22 0719  GLUCAP 100* 97 95    Signature:  Coralyn Helling, MD [place] [facility]/Critical [facility] Pager - [phone] or [phone] 12/14/2022, 9:36 AM

## 2022-12-14 NOTE — Progress Notes (Signed)
Called by ICU in regards to hyperkalemia. Cr stable (around his baseline), nonoliguric. I don't see any culprits in regards to meds that would cause this. Agree with med management for now. Agree with shifting agents and lokelma 10g TID. Can also consider lasix (with or without fluids depending on volume status) for kaliuresis. ABG pending. If acidotic, would recommend bicarb gtt. Fortunately no EKG changes as per CCM. Please call with any questions/concerns.  Anthony Sar, MD [facility]

## 2022-12-14 NOTE — Progress Notes (Signed)
eLink Physician-Brief Progress Note Patient Name: Gregory [name] DOB: 03/17/1961 MRN: [id number]   Date of Service  12/14/2022  HPI/Events of Note  K 5.7 Creat 2.07  eICU Interventions  Repeat bmp 1400     Intervention Category Intermediate Interventions: Electrolyte abnormality - evaluation and management  Henry Russel, P 12/14/2022, 4:20 AM

## 2022-12-14 NOTE — Discharge Instructions (Signed)
 Ileostomy Nutrition Therapy   During ileostomy surgery, the entire colon, rectum, and anus are removed or bypassed. The part of the small intestine called the ileum is brought through the abdominal wall, creating a stoma. The stoma is an opening in the abdomen that must be covered with a bag to collect stools at all times. It can be temporary or permanent depending on the surgery.  After surgery, your bowel will be swollen. Your eating plan will begin with a diet of clear liquids. As you recover, you will start eating solid foods, beginning with foods that are low in fiber (see Recommended Foods). You should avoid high-fiber foods because they are harder to digest. Avoiding high-fiber foods will allow the bowel to heal and prevent blockage of the ileostomy.  You should have less than 8 grams of fiber per day when you move from liquids to solid foods and then transition to less than 13 grams of fiber for the whole day in the next few days as your symptoms decrease. Most patients begin to eat more normally 6 weeks after surgery.    The changes to your diet recommended on this handout can help reduce symptoms such as diarrhea, odor, and gas; help you avoid a blockage; and help your body get more nutrients from your food as you heal from surgery.   Tips   You may not feel like eating much, so eat small amounts every 2 to 4 hours. Keep a regular schedule for meals and snacks to help reduce gas and help your body absorb nutrients from foods.  Let your health care provider know if you see whole foods or pills in your ostomy bag.  Do not use time-released, enteric-coated medications or very large tablets. Also avoid laxatives, which can cause dehydration.  Take a chewable (non-gummy) multivitamin with minerals daily.  Take a chewable or liquid calcium supplement. Liquid calcium citrate can be taken with food or without food.  Have your largest meal in the middle of the day. Do not eat large amounts in the  evening. This can help decrease stool output at night, so that you can limit emptying the ostomy bag then.  Eat foods that may thicken stool several times a day. Some foods can change the color of the stool. (See the Food Selection Guide.)   When you begin to add more variety back into your diet, add only 1 new food every few days. If there are foods that bothered you before surgery, add other foods first. Eat only a small amount when you re-try foods. If a food makes you feel sick, wait a few weeks and then re-try it. Keep a log of foods you try and how you feel when eating them.  To reduce gas, avoid chewing gum, drinking with straws and drinking carbonated beverages, smoking or chewing tobacco, eating too fast, and skipping meals. Missing meals can increase gas and watery stools.   Some foods may cause blockages (see Food Selection Guide). Use caution when eating these foods. Eat small amounts and chew your foods well to prevent blockage.  Get enough fluids. Aim for at least 8 to 10 cups of liquid per day. Drink liquids 30 minutes after meals or snacks to avoid flushing foods through your system too quickly.  During times of higher output (1,800 milliliters per day or more) or heavy sweating, you need to drink more fluids. You may need to actually measure how much you are drinking and your output from your ostomy   when it is high:   1 ounce is 30 milliliters o 1 cup is 8 ounces  8 cups is 64 ounces or 2 quarts or 2 liters  Watch for signs and symptoms of fluid-electrolyte imbalance. If symptoms occur, seek treatment right away. Symptoms include:   Dry mouth  Reduced urine output (not as much urine or urinating less often than normal)  Dark-colored urine  Feeling dizzy when you stand up  Noticeable fatigue (feeling extremely tired)  Abdominal cramping  If you have a high output ostomy, you may need to use an oral rehydration solution (ORS) to replace fluid loss. The World Health Organization has a  solution in powder form you can buy (called Oral Rehydration Salts). Some sports drinks can increase stoma output, so pediatric electrolyte solutions, such as Pedialyte, are recommended instead. A less expensive option is to make the oral rehydration solution yourself using one of the following recipes:   2 cups Gatorade + 2 cups water +  teaspoon salt  3 cups water + 1 cup orange juice +  teaspoon salt +  teaspoon baking soda o  cup grape juice or cranberry juice + 3 cups water +  teaspoon salt o 1 cup apple juice + 3 cups water +  teaspoon salt  4 cups (1 liter) water +  teaspoon table salt + 6 level teaspoons sugar (World Health Organization's ORS recipe)  Your registered dietitian nutritionist or doctor may suggest increasing foods that are higher in sodium and potassium. Remember to choose lower fiber foods that are high in potassium. Some examples of high-potassium foods include soy milk, yogurt, cottage cheese, potatoes without skin, liquid supplements (such as Ensure Muscle Health), orange juice or tomato juice (if these do not cause reflux or other symptoms), smooth peanut butter, and baked or broiled salmon or turkey. Salt substitute that contains potassium chloride can also be used, but be careful not to overuse it. Higher-sodium foods added to the diet should also be lower fiber and not fried.   Recommended Foods  If particular foods make you feel unwell, stop eating them and try again in 2 to 3 weeks.  Everyone's tolerance to foods is different. Finding foods that are best for you may require some trial and error.   Dairy Foods  Recommended Foods  Notes   Fat-free (skim) or low-fat (1%) milk*  Soy milk, rice milk, or almond milk  Lactose-free milk  Yogurt*  Powdered milk  Cheese*  Buttermilk  Low-fat ice cream* or sherbet  If you feel unwell after drinking milk or eating dairy foods, try lactose-free products. Foods marked with an asterisk (*) on this list have lactose.   Aged cheeses (such as cheddar and Swiss) are lower in lactose.  Check labels for calcium content of almond milk and rice milk to make sure they have 30% calcium. These beverages are not high in protein, so include other high-protein foods at meals and snacks to support healing.  Soy milk may cause gas and bloating for some people. It is best to avoid soy milk if it causes symptoms.    Protein Foods  Recommended Foods  Notes   Very tender, well-cooked meats and poultry prepared without added fat  Fish  Smooth nut butter (limit to 1 to 2 tablespoons a day)  Eggs (scrambled eggs are easiest to digest)  When trying nuts, fish, and eggs, start with small amounts. These foods may cause odors.  Use a moist heating method for meats and poultry:     Use water or broth to cook the meat or poultry at a lower temperature.  Cover the dish when cooking in the oven, so the food cooks in its own juices.    Marinate meat first with an acidic ingredient, such as vinegar, lemon juice, or wine, and some oil or by using chopped raw pineapple, which has natural enzymes. Pour off the marinade before cooking.     Grains  Recommended Foods  Notes   Grain foods made from white or refined flour, including bread, bagels, rolls; crackers; pasta; and cereal  White rice  Cream of wheat or cream of rice  Refined grits  Cereals made from refined grains, without added fiber, such as rice chex or cornflakes  Choose grain foods with less than 2 grams of fiber per serving. The grams of dietary fiber in 1 serving are listed on the Nutrition Facts label of packaged foods.  Read labels if you have problems with lactose. Any foods containing milk may contain lactose.    Vegetables  Recommended Foods  Notes   Well-cooked vegetables without seeds or skins, such as green beans or carrots  Potatoes without skin  Shredded lettuce on sandwich  Strained vegetable juice  Some vegetables may cause gas, blockages, or odors for some  people. See the Food Selection Guide for foods that may cause symptoms.    Fruits  Recommended Foods  Notes   Pulp-free fruit juices (except prune juice)  Ripe banana  Soft melons (watermelon or honeydew)  Peeled and cooked apple  Canned fruits (except pineapple), but avoid heavy syrup, which can make diarrhea worse  These can be added later with doctor's OK to increase fiber:  Some fruits may cause blockages. See the Food Selection Guide for foods that may cause symptoms.  Look for 100% fruit juices. These may need to be diluted in half or used to make oral rehydration solutions to be tolerated well.   ?  ?  Avocado  Orange or grapefruit without  membrane  Grapefruit and grapefruit juice should not be eaten when taking statin-type medications or budesonide (Entocort).    Fats   Recommended Foods  Notes   Any; olive oil and canola oil are good choices for heart health.  Start with very small amounts. Limit fats and oils to less than 8 teaspoons per day. Fats may cause symptoms or discomfort.  Spreads, butter contain lactose.    Beverages  Recommended Beverages  Notes   Water  Decaffeinated coffee or tea  Noncarbonated beverages  Rehydration beverages  Carbonated beverages may cause gas.     Foods Not Recommended    When you first start eating solid foods, avoid foods that are high in fiber, such as whole grains, dried beans, and most raw vegetables and fruits. (See Foods Recommended list for low-fiber foods. See Food Selection Guide lists for foods that cause symptoms.) ? Avoid acidic, spicy, fried, and greasy foods and foods high in sugar.  Limit food and drinks that contain sugar substitutes. Often diluted sugar-containing drinks do better. Cut fruit juice in half by adding an equal amount of water or more.  While you heal, avoid any foods that cause you to have odor, gas, diarrhea, or obstruction.  You should not have any salad or raw vegetables.  If you have kidney stones, you  should try to avoid foods that are high in oxalate. Your RDN may give you a more detailed list of high-oxalate foods if you are at risk. Some foods   that are high in oxalate include:   Grains: Wheat bran, wheat germ, whole wheat flour   Protein Foods: Beans, tofu, nuts  Vegetables: Beets, dark leafy greens, sweet potato  Beverages: Beer, cocoa, instant tea, instant coffee  Other: Carob, chocolate    Food Selection Guide for Ileostomy Patients   Foods That May Cause Blockage   Apples, unpeeled  Bean sprouts  Cabbage, raw  Casing on sausage  Celery  Chinese vegetables  Coconut  Coleslaw  Corn  Popcorn  Relishes and olives  Salad greens  Seeds and nuts  Spinach Tough, fibrous meats (for example, steak on grill)  Vegetable and fruit skins  Whole grains  Cucumbers  Dried fruit, raisins  Grapes  Green peppers  Mushrooms  Nuts  Peas  Pickles  Pineapple      Foods That May Cause Gas or Odor  Alcohol  Apples  Asparagus  Bananas  Beer  Broccoli  Brussels sprouts  Cabbage  Carbonated beverages  Cauliflower  Cheese, some types  Corn  Cucumber  Dairy products  Dried beans and peas  Eggs  Fatty foods  Fish  Grapes  Green pepper  Melons  Onions  Peanuts  Prunes  Radishes  Turnips     Foods That May Help Relieve Gas and Odor  Buttermilk  Cranberry juice  Parsley  Yogurt with active cultures      Diarrhea   Initially after surgery, the stool will be liquid and watery because of where the ileostomy is located in the intestine. The stool will gradually become thicker over the next few weeks (more like a consistency of pudding or oatmeal). There are some foods that can help make the stool thicker or thinner, but if changes to your diet do not improve stools, then your doctor may be able to recommend some medications to help.   Foods That May Cause Diarrhea (looser or more frequent stool)  Alcohol (including beer)  Apricots (and stone fruits)  Beans, baked or  legumes  Bran  Broccoli  Brussels sprouts  Cabbage  Caffeinated drinks  (especially hot)  Chocolate  Corn  Fried meats, fish poultry  Fruit juice: apple, grape, orange  Fruit: fresh, canned, or dried  Glucose-free foods containing mannitol or  sorbitol  Gum, sugar free   High-fat foods  High-sugar foods  Licorice  Milk and dairy foods  Nuts or seeds  Peaches (stone fruit)  Peas  Plums (stone fruit)  Prune juice or prunes  Soup  Spicy foods  Sugar-free substitutes  Tomatoes  Turnip greens/green leafy vegetables, raw  Wheat/whole grains  Wine    Foods That May Help Thicken Stool  Applesauce   Bananas   Barley (when OK to have fiber)   Cheese   Marshmallows   Oatmeal (when OK to have fiber)  Pasta (sauces may increase symptoms)  Peanut butter, creamy  Potatoes, no skin  Pretzels  Saltines  Tapioca  White bread (not high fiber)  White rice, boiled  Yogurt   Foods That May Discolor Stool   Turn stool red  Darken stool  Beets  Asparagus  Foods with red dye  Broccoli   Spinach      Ileostomy Sample 1-Day Menu  Note: It may help to not have beverages with meals or snacks, waiting 30 minutes before drinking. Fiber is rounded to the nearest 0.5g and listed in parentheses ( ) after foods that contain fiber.   Meal  Menu  Total Fiber   Breakfast  1   scrambled egg plain or with  cup low-fat, low-lactose cheese  English muffin or white toast (1.5 grams fiber) with 1 teaspoon margarine   cup unsweetened applesauce (1.5 grams fiber)  3.0 grams   30 minutes after breakfast  4 ounces cranberry grape juice diluted with 4 ounces water   1 cups (12 ounces) decaf tea     Lunch   2 ounces shaved or very thinly sliced roast beef with juices   cup mashed potatoes (1.5 grams fiber) (made with lactose-free milk or chicken broth and olive oil or very small amount of butter)   cup green beans, well-cooked (2.0 grams fiber)  1 cup canned peaches, extra light syrup (1.0 gram  fiber)  4.5 grams   30 minutes after lunch  1 cup (8 ounces) fat-free, soy, or lactose-free milk     Snack  8 ounces yogurt without fruits or nuts  1 ripe banana (3.0 grams fiber)    3.0 grams   30 min after snack  1 cup (8 ounces) sport drink or Pedialyte or Oral Rehydration Solution (ORS) if needed     Dinner  Turkey sandwich: 2 ounces turkey, 1 ounce Swiss cheese,  2 slices white bread (1.0 gram fiber)  2 ounces pretzels (1.0 gram fiber)  2.0 grams   30 minutes after dinner  1 cups water or Gatorade/Pedialyte or ORS     Snack   6 saltine crackers (0.5 grams fiber)  1 ounce low-fat cheddar cheese or Swiss  0.5 grams   30 minutes after snack  1 cup (8 ounces) fat-free, soy, or lactose-free milk         13 grams fiber daily      

## 2022-12-14 NOTE — Progress Notes (Signed)
Pt brought to the room but because potassium is level 7.0, patient will be going back to ICU floor. Pt is currently alert, comfortable, and will be picked up as soon as possible.

## 2022-12-14 NOTE — Progress Notes (Signed)
Most recent labs shows K is much better, down to 5.5. Cont with med management. Let me know if nephrology still needs to see patient. Call with questions/concerns.  Anthony Sar, MD [facility]

## 2022-12-14 NOTE — Progress Notes (Signed)
    Latest Ref Rng & Units 12/14/2022    3:24 PM 12/14/2022    1:15 AM 12/13/2022    4:05 AM  BMP  Glucose 70 - 99 mg/dL 161  096  045   BUN 8 - 23 mg/dL 56  54  60   Creatinine 0.61 - 1.24 mg/dL 4.09  8.11  9.14   Sodium 135 - 145 mmol/L 136  141  143   Potassium 3.5 - 5.1 mmol/L 7.0  5.7  4.8   Chloride 98 - 111 mmol/L 110  105  112   CO2 22 - 32 mmol/L 18  20  24    Calcium 8.9 - 10.3 mg/dL 8.0  8.7  7.9     Unclear why his potassium is increasing.  Will give calcium gluconate, bicarbonate.  Start lokelma.  Check ECG, ABG.  Transfer back to ICU.  Discussed with nephrology.  Coralyn Helling, MD [facility] Pulmonary/Critical Care Pager - [phone] or [phone] 12/14/2022, 5:02 PM

## 2022-12-14 NOTE — Evaluation (Signed)
Physical Therapy Evaluation Patient Details Name: Gregory [name] MRN: [id number] DOB: 08-31-1960 Today's Date: 12/14/2022  History of Present Illness  DEMAREE HOSKING is a 62 y.o. man admitted 6/17 with abdominal pain, and urinary incontinence.  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 CT revealed right occipital infarct. 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  Clinical Impression   Pt presents with debility with bodywide weakness, impaired skin integrity with sacral wound, severe abdominal pain, and decreased activity tolerance. Pt to benefit from acute PT to address deficits. Pt tolerated AROM of Ues and Les only, adamantly refuses any attempts at rolling, chair position, repositioning, or EOB activity due to severe abdominal pain. PT explained the importance of mobility if he wants to return to PLOF, especially given his complicated medical course and prolonged intubation, pt expresses understanding and continues to decline. PT to progress mobility as tolerated, and will continue to follow acutely.         Recommendations for follow up therapy are one component of a multi-disciplinary discharge planning process, led by the attending physician.  Recommendations may be updated based on patient status, additional functional criteria and insurance authorization.  Follow Up Recommendations Can patient physically be transported by private vehicle: No     Assistance Recommended at Discharge Frequent or constant Supervision/Assistance  Patient can return home with the following  Two people to help with walking and/or transfers;Two people to help with bathing/dressing/bathroom;Assistance with cooking/housework;Direct supervision/assist for  medications management;Assistance with feeding;Direct supervision/assist for financial management;Assist for transportation;Help with stairs or ramp for entrance    Equipment Recommendations Other (comment) (defer)  Recommendations for Other Services       Functional Status Assessment Patient has had a recent decline in their functional status and demonstrates the ability to make significant improvements in function in a reasonable and predictable amount of time.     Precautions / Restrictions Precautions Precautions: Fall Precaution Comments: JP drain, colectomy with abdominal incision, 2LO2 Restrictions Weight Bearing Restrictions: No      Mobility  Bed Mobility Overal bed mobility: Needs Assistance             General bed mobility comments: pt declines any bed mobility assessment    Transfers                   General transfer comment: pt declines    Ambulation/Gait                  Stairs            Wheelchair Mobility    Modified Rankin (Stroke Patients Only) Modified Rankin (Stroke Patients Only) Pre-Morbid Rankin Score: Moderately severe disability Modified Rankin: Severe disability     Balance                                             Pertinent Vitals/Pain Pain Assessment Pain Assessment: Faces Faces Pain Scale: Hurts whole lot Pain Location: abdomen Pain Descriptors / Indicators: Sore, Discomfort Pain Intervention(s): Limited activity within patient's tolerance, Monitored during session, Repositioned    Home Living Family/patient expects to be discharged to:: Skilled nursing facility  Additional Comments: resides at long term care facility    Prior Function Prior Level of Function : Needs assist             Mobility Comments: pt reports getting to EOB and scooting to motorized w/c without assist ADLs Comments: pt reports doing most of dressing/bathing himself, meals  brought to pt's room     Hand Dominance   Dominant Hand: Right    Extremity/Trunk Assessment   Upper Extremity Assessment Upper Extremity Assessment: Defer to OT evaluation    Lower Extremity Assessment Lower Extremity Assessment: Generalized weakness RLE Deficits / Details: performs hip flexion/extension, abd/adduction on command, history of AKA LLE Deficits / Details: can perform heel slide, min ROM ankle pump, SLR, and quad set    Cervical / Trunk Assessment Cervical / Trunk Assessment: Other exceptions Cervical / Trunk Exceptions: abdominal surgery, unable to assess in chair position or EOB  Communication   Communication: No difficulties  Cognition Arousal/Alertness: Awake/alert Behavior During Therapy: Flat affect Overall Cognitive Status: Impaired/Different from baseline Area of Impairment: Following commands, Safety/judgement, Problem solving                       Following Commands: Follows one step commands with increased time Safety/Judgement: Decreased awareness of safety, Decreased awareness of deficits   Problem Solving: Slow processing, Requires verbal cues, Requires tactile cues General Comments: pt irritable about participating in PT, states he is in too much pain and is self-limiting        General Comments General comments (skin integrity, edema, etc.): 2LO2, VSS    Exercises General Exercises - Upper Extremity Shoulder Flexion: AROM, Both, Supine General Exercises - Lower Extremity Ankle Circles/Pumps: Left, 5 reps, Supine, AROM Short Arc Quad: AROM, Left, 10 reps, Supine Heel Slides: 5 reps, Supine, AROM, Left Hip Flexion/Marching: AROM, Right, 5 reps, Supine   Assessment/Plan    PT Assessment Patient needs continued PT services  PT Problem List Decreased activity tolerance;Decreased balance;Decreased strength;Decreased mobility;Decreased knowledge of use of DME;Decreased safety awareness;Decreased cognition;Decreased knowledge of  precautions;Cardiopulmonary status limiting activity;Obesity;Decreased range of motion       PT Treatment Interventions DME instruction;Functional mobility training;Therapeutic activities;Therapeutic exercise;Balance training;Patient/family education;Wheelchair mobility training    PT Goals (Current goals can be found in the Care Plan section)  Acute Rehab PT Goals Patient Stated Goal: return to [facility] PT Goal Formulation: With patient Time For Goal Achievement: 12/28/22 Potential to Achieve Goals: Fair    Frequency Min 2X/week     Co-evaluation               AM-PAC PT "6 Clicks" Mobility  Outcome Measure Help needed turning from your back to your side while in a flat bed without using bedrails?: A Lot Help needed moving from lying on your back to sitting on the side of a flat bed without using bedrails?: Total Help needed moving to and from a bed to a chair (including a wheelchair)?: Total Help needed standing up from a chair using your arms (e.g., wheelchair or bedside chair)?: Total Help needed to walk in hospital room?: Total Help needed climbing 3-5 steps with a railing? : Total 6 Click Score: 7    End of Session Equipment Utilized During Treatment: Oxygen Activity Tolerance: Patient limited by pain Patient left: in bed;with call bell/phone within reach;with bed alarm set Nurse Communication: Mobility status PT Visit Diagnosis: Muscle weakness (generalized) (M62.81)    Time: 3244-0102 PT Time Calculation (min) (ACUTE ONLY): 13 min  Charges:   PT Evaluation $PT Eval Low Complexity: 1 Low          Garnell Phenix S, PT DPT Acute Rehabilitation Services Secure Chat Preferred  Office [phone]   Truddie Coco 12/14/2022, 3:20 PM

## 2022-12-14 NOTE — Progress Notes (Signed)
Wasted 26ml (1mg /ml) of hydromorphone HCL PCA syringe into stericycle in medication room for patient in 18M11. This was witnessed by Lemar Livings, RN, on 12/14/2022 @ 11:57am. This medication is not available to waste in the pyxis on 18M, and it does not show on the weblink pyxis to waste. Pharmacy was consulted.

## 2022-12-14 NOTE — Progress Notes (Signed)
11 Days Post-Op   Subjective/Chief Complaint: Ostomy working well  PT tol PO   Objective: Vital signs in last 24 hours: Temp:  [97.6 F (36.4 C)-99.8 F (37.7 C)] 98.7 F (37.1 C) (06/28 0720) Pulse Rate:  [63-86] 73 (06/28 0700) Resp:  [16-56] 20 (06/28 0757) BP: (103-191)/(45-77) 106/58 (06/28 0700) SpO2:  [88 %-99 %] 96 % (06/28 0757) Weight:  [99.1 kg] 99.1 kg (06/28 0434) Last BM Date : 12/13/22  Intake/Output from previous day: 06/27 0701 - 06/28 0700 In: 3601.9 [P.O.:2604; I.V.:152.9; NG/GT:575; IV Piggyback:269.9] Out: 2480 [Urine:1820; Drains:10; Stool:650] Intake/Output this shift: No intake/output data recorded.  General appearance: alert and cooperative GI: soft, non-tender; bowel sounds normal; no masses,  no organomegaly and midline wound c/d/I, ostomy patent  Lab Results:  Recent Labs    12/13/22 0405 12/14/22 0115  WBC 12.5* 12.2*  HGB 7.3* 7.3*  HCT 25.2* 25.7*  PLT 366 391   BMET Recent Labs    12/13/22 0405 12/14/22 0115  NA 143 141  K 4.8 5.7*  CL 112* 105  CO2 24 20*  GLUCOSE 194* 110*  BUN 60* 54*  CREATININE 2.27* 2.07*  CALCIUM 7.9* 8.7*   PT/INR No results for input(s): "LABPROT", "INR" in the last 72 hours. ABG No results for input(s): "PHART", "HCO3" in the last 72 hours.  Invalid input(s): "PCO2", "PO2"  Studies/Results: DG Chest Port 1 View  Result Date: 12/13/2022 CLINICAL DATA:  81191.  Respiratory failure. EXAM: PORTABLE CHEST 1 VIEW COMPARISON:  Portable chest 12/05/2022 FINDINGS: 4:59 a.m. ETT tip is 3.1 cm from the carina. NGT enters the stomach but the intragastric course was not filmed. Right IJ line terminates in mid to distal SVC. Patchy airspace disease continues to be noted in the lower lung fields and seems unchanged. There is no substantial pleural effusion. The upper lung fields remain clear. The cardiomediastinal silhouette is normal with calcification again in the transverse aorta. No acute osseous  abnormality. There is lower cervical fusion hardware partially visible. Compare: Overall aeration is unchanged. IMPRESSION: 1. No significant change in patchy airspace disease in the lower lung fields. 2. Support apparatus stable. 3. Aortic atherosclerosis. Electronically Signed   By: Almira Bar M.D.   On: 12/13/2022 07:48    Anti-infectives: Anti-infectives (From admission, onward)    Start     Dose/Rate Route Frequency Ordered Stop   12/05/22 1800  vancomycin (VANCOREADY) IVPB 1250 mg/250 mL  Status:  Discontinued        1,250 mg 166.7 mL/hr over 90 Minutes Intravenous Every 48 hours 12/03/22 2346 12/08/22 1148   12/05/22 1300  meropenem (MERREM) 1 g in sodium chloride 0.9 % 100 mL IVPB        1 g 200 mL/hr over 30 Minutes Intravenous Every 12 hours 12/05/22 1201 12/10/22 0700   12/04/22 0100  piperacillin-tazobactam (ZOSYN) IVPB 3.375 g  Status:  Discontinued        3.375 g 12.5 mL/hr over 240 Minutes Intravenous Every 8 hours 12/03/22 2346 12/05/22 1159   12/03/22 1700  vancomycin (VANCOREADY) IVPB 2000 mg/400 mL        2,000 mg 200 mL/hr over 120 Minutes Intravenous  Once 12/03/22 1657 12/03/22 2020   12/03/22 1700  piperacillin-tazobactam (ZOSYN) IVPB 3.375 g        3.375 g 100 mL/hr over 30 Minutes Intravenous  Once 12/03/22 1657 12/03/22 1804       Assessment/Plan: POD 11 , s/p ex lap with subtotal colectomy secondary to ischemia,  Dr. Sheliah Hatch 12/04/22 -NS WD dressing changes to midline wound BID -completed abx therapy for abd and for ESBL in urine -tol FLD well Ok to adat. -cont JP drain,output likely secondary to some ascites.  serous -WBC 12.2: midline wound looks good, con't WTD dressing changes -hgb 7.3.  may resume DVT prophylaxis from surgical stnadpoint -scheduled Tylenol and Robaxin to help with non-narcotic pain control.   -WOC following for new ileostomy -imodium to TID, continue fiber, both seem to be slowing down ostomy output.  Monitor.     FEN - FLD  ADAT/IVFs per primary VTE - SQH ID - vanc/Merrem, completed   Septic shock - resolved VDRF - per CCM MMP - per CCM  LOS: 11 days    Axel Filler 12/14/2022

## 2022-12-14 NOTE — Progress Notes (Signed)
ABG attempted 2X unsuccessfully. A second RT will attempt to obtain.

## 2022-12-15 DIAGNOSIS — A419 Sepsis, unspecified organism: Secondary | ICD-10-CM | POA: Diagnosis not present

## 2022-12-15 DIAGNOSIS — N1832 Chronic kidney disease, stage 3b: Secondary | ICD-10-CM

## 2022-12-15 DIAGNOSIS — N170 Acute kidney failure with tubular necrosis: Secondary | ICD-10-CM

## 2022-12-15 DIAGNOSIS — K559 Vascular disorder of intestine, unspecified: Secondary | ICD-10-CM | POA: Diagnosis not present

## 2022-12-15 DIAGNOSIS — D72829 Elevated white blood cell count, unspecified: Secondary | ICD-10-CM | POA: Diagnosis not present

## 2022-12-15 DIAGNOSIS — E875 Hyperkalemia: Secondary | ICD-10-CM

## 2022-12-15 LAB — BASIC METABOLIC PANEL
Anion gap: 9 (ref 5–15)
BUN: 55 mg/dL — ABNORMAL HIGH (ref 8–23)
CO2: 22 mmol/L (ref 22–32)
Calcium: 8.5 mg/dL — ABNORMAL LOW (ref 8.9–10.3)
Chloride: 105 mmol/L (ref 98–111)
Creatinine, Ser: 2.05 mg/dL — ABNORMAL HIGH (ref 0.61–1.24)
GFR, Estimated: 36 mL/min — ABNORMAL LOW (ref >60)
Glucose, Bld: 135 mg/dL — ABNORMAL HIGH (ref 70–99)
Potassium: 5.6 mmol/L — ABNORMAL HIGH (ref 3.5–5.1)
Sodium: 136 mmol/L (ref 135–145)

## 2022-12-15 LAB — COMPREHENSIVE METABOLIC PANEL
ALT: 29 U/L (ref 0–44)
AST: 26 U/L (ref 15–41)
Albumin: 1.9 g/dL — ABNORMAL LOW (ref 3.5–5.0)
Alkaline Phosphatase: 171 U/L — ABNORMAL HIGH (ref 38–126)
Anion gap: 10 (ref 5–15)
BUN: 52 mg/dL — ABNORMAL HIGH (ref 8–23)
CO2: 20 mmol/L — ABNORMAL LOW (ref 22–32)
Calcium: 8.5 mg/dL — ABNORMAL LOW (ref 8.9–10.3)
Chloride: 107 mmol/L (ref 98–111)
Creatinine, Ser: 2.15 mg/dL — ABNORMAL HIGH (ref 0.61–1.24)
GFR, Estimated: 34 mL/min — ABNORMAL LOW (ref >60)
Glucose, Bld: 132 mg/dL — ABNORMAL HIGH (ref 70–99)
Potassium: 5.3 mmol/L — ABNORMAL HIGH (ref 3.5–5.1)
Sodium: 137 mmol/L (ref 135–145)
Total Bilirubin: 0.7 mg/dL (ref 0.3–1.2)
Total Protein: 7.1 g/dL (ref 6.5–8.1)

## 2022-12-15 LAB — CULTURE, BLOOD (ROUTINE X 2)
Culture: NO GROWTH
Culture: NO GROWTH

## 2022-12-15 LAB — CBC
HCT: 30.5 % — ABNORMAL LOW (ref 39.0–52.0)
Hemoglobin: 8.6 g/dL — ABNORMAL LOW (ref 13.0–17.0)
MCH: 27.9 pg (ref 26.0–34.0)
MCHC: 28.2 g/dL — ABNORMAL LOW (ref 30.0–36.0)
MCV: 99 fL (ref 80.0–100.0)
Platelets: 452 10*3/uL — ABNORMAL HIGH (ref 150–400)
RBC: 3.08 MIL/uL — ABNORMAL LOW (ref 4.22–5.81)
RDW: 15 % (ref 11.5–15.5)
WBC: 11.7 10*3/uL — ABNORMAL HIGH (ref 4.0–10.5)
nRBC: 0 % (ref 0.0–0.2)

## 2022-12-15 LAB — MAGNESIUM: Magnesium: 1.8 mg/dL (ref 1.7–2.4)

## 2022-12-15 LAB — GLUCOSE, CAPILLARY
Glucose-Capillary: 114 mg/dL — ABNORMAL HIGH (ref 70–99)
Glucose-Capillary: 117 mg/dL — ABNORMAL HIGH (ref 70–99)
Glucose-Capillary: 173 mg/dL — ABNORMAL HIGH (ref 70–99)
Glucose-Capillary: 130 mg/dL — ABNORMAL HIGH (ref 70–99)

## 2022-12-15 LAB — PHOSPHORUS: Phosphorus: 6.1 mg/dL — ABNORMAL HIGH (ref 2.5–4.6)

## 2022-12-15 MED ORDER — ACETAMINOPHEN 325 MG PO TABS
650.0000 mg | ORAL_TABLET | Freq: Four times a day (QID) | ORAL | Status: DC
  Administered 2022-12-15 – 2022-12-25 (×36): 650 mg via ORAL
  Filled 2022-12-15 (×37): qty 2

## 2022-12-15 MED ORDER — TAMSULOSIN HCL 0.4 MG PO CAPS
0.4000 mg | ORAL_CAPSULE | Freq: Every day | ORAL | Status: DC
  Administered 2022-12-15 – 2022-12-25 (×11): 0.4 mg via ORAL
  Filled 2022-12-15 (×11): qty 1

## 2022-12-15 MED ORDER — GUAIFENESIN ER 600 MG PO TB12
1200.0000 mg | ORAL_TABLET | Freq: Two times a day (BID) | ORAL | Status: DC
  Administered 2022-12-15 – 2022-12-25 (×20): 1200 mg via ORAL
  Filled 2022-12-15 (×20): qty 2

## 2022-12-15 NOTE — Progress Notes (Signed)
Pt arrived to unit from 42M, VSS, A/O x 4,  CCMD called ,CHG given, pt oriented to unit,Will continue to monitor.   Karna Christmas Breyden Jeudy, RN    12/15/22 1322  Vitals  Temp 99.4 F (37.4 C)  Temp Source Oral  BP (!) 158/67  MAP (mmHg) 91  BP Location Left Arm  BP Method Automatic  Patient Position (if appropriate) Lying  Pulse Rate 85  Pulse Rate Source Monitor  ECG Heart Rate 86  Resp (!) 23  Level of Consciousness  Level of Consciousness Alert  Oxygen Therapy  SpO2 95 %  O2 Device Nasal Cannula  O2 Flow Rate (L/min) 2 L/min  Pain Assessment  Pain Scale 0-10  Pain Score 9  MEWS Score  MEWS Temp 0  MEWS Systolic 0  MEWS Pulse 0  MEWS RR 1  MEWS LOC 0  MEWS Score 1  MEWS Score Color Chilton Si

## 2022-12-15 NOTE — Progress Notes (Signed)
PROGRESS NOTE    Gregory [name]  ZOX:[id number] DOB: 1961-01-10 DOA: 12/03/2022 PCP: Sherron Monday, MD   Brief Narrative:  Patient is a 61 year old obese Caucasian male with a past medical history significant for but not limited to COPD on home oxygen, diabetes mellitus type 2 status post right lower extremity AKA as well as other comorbidities who is a SNF resident at [facility] with frequent admissions to hospital.  On 12/03/2022 he presented to the ED with abdominal pain, urinary continence and hypoxia requiring 4 L nasal cannula.  His lactic acid level was 3 and he was given fluid bolus and repeat lactic acid was 4.  CT was concerning with an ileus with dilatation of the large intestine with stool balls in the distal colon.  General surgery was consulted given his rising lactic acid and concern for mesenteric ischemia.  The plan was to give an enema to clear stool burden with frequent serial examinations.  He was admitted to the critical care service given his elevated WBC is elevated at 28.7 he started on IV vancomycin and Zosyn.  On 12/04/2022 he had worsening lactic acidosis and physical examination and the decision was made to take the patient to the OR for exploration and patient was found to have ischemic colon and underwent a total abdominal colectomy and end ileostomy.  Postoperative setting he presented to ICU and was intubated and was hypotensive requiring multiple pressors including neo-, epi, Levophed and vasopressin along with a bicarbonate drip.  On 12/05/2022 his vasopressor requirements improved and he was extubated.  Current significant hospital events include the following:  Significant Hospital Events: Including procedures, antibiotic start and stop dates in addition to other pertinent events   6/17 admitted, s/p exploratory laparotomy for total colectomy due to ischemic colon 6/17 urine>> EColi>> 6/19 > extubated. A.Fib RVR - re intubated that night 6/22 developed A-fib RVR  overnight resulting in need for amiodarone drip and reinitiation of Levophed.   6/23 Required addition amiodarone bolus overnight, NSR on am exam  6/24 off pressors 6/25 ketamine stopped 6/27 Extubate 6/27 12/14/2018 for his potassium was increased and so case was discussed with nephrology and temporary measures were started on potassium now improving.  On 12/15/2022 he was transferred to the [facility] service.  Will obtain PT and OT to further evaluate and treat and try to get more mobilized   Assessment and Plan:  Septic Shock with E Coli UTI and Ischemic Colitis  -Improved -Off of Pressors -WBC Trend: Recent Labs  Lab 12/09/22 0048 12/10/22 0100 12/11/22 0405 12/12/22 0234 12/13/22 0405 12/14/22 0115 12/15/22 0056  WBC 6.6 9.4 10.7* 11.7* 12.5* 12.2* 11.7*  -Antibiotics completed on 12/09/22  Acute Hypoxic Respiratory Failure and Aspiration Pneumonitis, Improved COPD SpO2: 95 % O2 Flow Rate (L/min): 2 L/min FiO2 (%): 40 % -Continue arformoterol 15 mcg nebs twice daily, budesonide 0.5 mg nebs twice daily, and revefenacin 175 mcg nebs daily -Continue with as needed albuterol as necessary -Continue with flutter valve, incentive spirometry and add guaifenesin 1200 mg p.o. twice daily -Neck chest x-ray in a.m.   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 -C/w post op care per Surgery, nutrition per surgery   Paroxysmal atrial fibrillation in setting of acute stress. HTN HLD PAD. -Had a Normal EF on Echo from 6/20 -Back in sinus rhythm -Continue Amiodarone 200 mg po Daily, Carvedilol 12.5 mg po BID, Hydralazine 10 mg po q8h, ASA 81 mg po Daily and Atorvastatin 80 mg po Daily -  Patient also has IV hydralazine 10 mg every 6 hours as needed for systolic blood pressure greater than 160 or diastolic blood pressure below 161 -Continue to Monitor BP per Protocol  -Last BP reading was 158/67   AKI from ATN 2nd to sepsis on CKD Stage 3b. Hyperkalemia. Metabolic  Acidosis -Baseline creatinine 1.60 from 09/21/22 -BUN/Cr Trend: Recent Labs  Lab 12/12/22 0234 12/13/22 0405 12/14/22 0115 12/14/22 1524 12/14/22 2148 12/15/22 0056 12/15/22 1038  BUN 59* 60* 54* 56* 56* 55* 52*  CREATININE 2.39* 2.27* 2.07* 2.04* 2.05* 2.05* 2.15*  -K+ Trend: Recent Labs  Lab 12/13/22 0405 12/14/22 0115 12/14/22 1524 12/14/22 1756 12/14/22 2148 12/15/22 0056 12/15/22 1038  K 4.8 5.7* 7.0* 5.5* 5.6* 5.6* 5.3*  -Slight metabolic acidosis with a CO2 of 20, anion gap of 10, Chloride Level of 107 -Avoid Nephrotoxic Medications, Contrast Dyes, Hypotension and Dehydration to Ensure Adequate Renal Perfusion and will need to Renally Adjust Meds -Given temporizing measures and was given calcium gluconate 1 g yesterday along with sodium bicarb and 50 medical ejection and is now on Lokelma 10 g 3 times daily for 6 doses -Continue to Monitor and Trend Renal Function carefully and repeat CMP in the AM  -Continue to Monitor  urine outpt with high ostomy outpt   Anemia of critical illness and iron deficiency and of Chronic Kidney Disease. -Hgb/Hct Trend: Recent Labs  Lab 12/10/22 0100 12/11/22 0405 12/12/22 0234 12/13/22 0405 12/14/22 0115 12/14/22 1756 12/15/22 0056  HGB 7.9* 7.6* 7.4* 7.3* 7.3* 7.8* 8.6*  HCT 26.7* 26.0* 25.7* 25.2* 25.7* 23.0* 30.5*  MCV 90.5 93.2 94.8 98.4 97.0  --  99.0  -Transfuse for Hb < 7 -Given IV iron infusion on 6/27 -Check Anemia Panel in the AM -Continue to Monitor for S/Sx of Bleeding; No overt bleeding noted -Repeat CBC in the AM   DM Type 2 poorly controlled with Hyperglycemia. -Continue with resistant NovoLog sliding scale insulin before meals and at bedtime as well as Semglee 40 units twice daily -Glucose Level Trend: Recent Labs  Lab 12/12/22 0234 12/13/22 0405 12/14/22 0115 12/14/22 1524 12/14/22 2148 12/15/22 0056 12/15/22 1038  GLUCOSE 222* 194* 110* 130* 150* 135* 132*  -CBG Trend: Recent Labs  Lab  12/14/22 0719 12/14/22 1117 12/14/22 1524 12/14/22 1933 12/15/22 0816 12/15/22 1124 12/15/22 1618  GLUCAP 95 127* 108* 125* 114* 117* 173*   BPH. -C/w Tamsulosin 0.4 mg Daily    Chronic neuropathic pain. -Dilaudid PCA d/c'eD - methadone started 6/25 >> titrate this up as needed to wean off  oxycodone -Continue lexapro, neurontin, lidoderm patch, robaxin, seroquel  Pressure Injuries, poA Pressure Injury 09/05/22 Ankle Anterior;Left Stage 2 -  Partial thickness loss of dermis presenting as a shallow open injury with a red, pink wound bed without slough. full thickness wound, NOT a pressure injury (Active)  09/05/22 2250  Location: Ankle  Location Orientation: Anterior;Left  Staging: Stage 2 -  Partial thickness loss of dermis presenting as a shallow open injury with a red, pink wound bed without slough.  Wound Description (Comments): full thickness wound, NOT a pressure injury  Present on Admission: Yes     Pressure Injury 12/04/22 Buttocks Posterior;Proximal;Right Stage 2 -  Partial thickness loss of dermis presenting as a shallow open injury with a red, pink wound bed without slough. (Active)  12/04/22 0410  Location: Buttocks  Location Orientation: Posterior;Proximal;Right  Staging: Stage 2 -  Partial thickness loss of dermis presenting as a shallow open injury with a red,  pink wound bed without slough.  Wound Description (Comments):   Present on Admission: Yes     Pressure Injury 12/04/22 Thigh Left;Posterior Stage 3 -  Full thickness tissue loss. Subcutaneous fat may be visible but bone, tendon or muscle are NOT exposed. (Active)  12/04/22 0410  Location: Thigh  Location Orientation: Left;Posterior  Staging: Stage 3 -  Full thickness tissue loss. Subcutaneous fat may be visible but bone, tendon or muscle are NOT exposed.  Wound Description (Comments):   Present on Admission: Yes   Hypoalbuminemia -Patient's Albumin Trend: Recent Labs  Lab 12/03/22 1654 12/04/22 0941  12/06/22 0900 12/07/22 1200 12/15/22 1038  ALBUMIN 3.5 2.1* <1.5* 2.3* 1.9*  -Continue to Monitor and Trend and repeat CMP in the AM  Obesity -Complicates overall prognosis and care -Estimated body mass index is 30.17 kg/m as calculated from the following:   Height as of this encounter: 6' (1.829 m).   Weight as of this encounter: 100.9 kg.  -Weight Loss and Dietary Counseling given  DVT prophylaxis: heparin injection 5,000 Units Start: 12/08/22 1400    Code Status: Full Code Family Communication: No family currently at bedside  Disposition Plan:  Level of care: Progressive Status is: Inpatient Remains inpatient appropriate because: His labs are improved further and needs more mobilization   Consultants:  PCCM transfer  Procedures:  As delineated as above  Antimicrobials:  Anti-infectives (From admission, onward)    Start     Dose/Rate Route Frequency Ordered Stop   12/05/22 1800  vancomycin (VANCOREADY) IVPB 1250 mg/250 mL  Status:  Discontinued        1,250 mg 166.7 mL/hr over 90 Minutes Intravenous Every 48 hours 12/03/22 2346 12/08/22 1148   12/05/22 1300  meropenem (MERREM) 1 g in sodium chloride 0.9 % 100 mL IVPB        1 g 200 mL/hr over 30 Minutes Intravenous Every 12 hours 12/05/22 1201 12/10/22 0700   12/04/22 0100  piperacillin-tazobactam (ZOSYN) IVPB 3.375 g  Status:  Discontinued        3.375 g 12.5 mL/hr over 240 Minutes Intravenous Every 8 hours 12/03/22 2346 12/05/22 1159   12/03/22 1700  vancomycin (VANCOREADY) IVPB 2000 mg/400 mL        2,000 mg 200 mL/hr over 120 Minutes Intravenous  Once 12/03/22 1657 12/03/22 2020   12/03/22 1700  piperacillin-tazobactam (ZOSYN) IVPB 3.375 g        3.375 g 100 mL/hr over 30 Minutes Intravenous  Once 12/03/22 1657 12/03/22 1804       Subjective: Seen and examined at bedside and states that he was hurting.  Was transferred out of the ICU yesterday and then transferred back given his potassium being high.   Potassium is improving.  Patient asking for some pain medication.  Will need further mobilization.  Objective: Vitals:   12/15/22 1000 12/15/22 1200 12/15/22 1300 12/15/22 1322  BP: (!) 143/56 125/66 (!) 146/81 (!) 158/67  Pulse: 76 80 90 85  Resp: 17 (!) 26 (!) 30 (!) 23  Temp:  98.1 F (36.7 C)  99.4 F (37.4 C)  TempSrc:  Oral  Oral  SpO2: 95% 94% 94% 95%  Weight:      Height:        Intake/Output Summary (Last 24 hours) at 12/15/2022 1755 Last data filed at 12/15/2022 1500 Gross per 24 hour  Intake 715.55 ml  Output 1815 ml  Net -1099.45 ml   Filed Weights   12/13/22 0450 12/14/22 0434 12/15/22 0500  Weight: 97.8 kg 99.1 kg 100.9 kg   Examination: Physical Exam:  Constitutional: WN/WD obese chronically ill-appearing Caucasian male Respiratory: Diminished to auscultation bilaterally with some coarse breath sounds, no wheezing, rales, rhonchi or crackles. Normal respiratory effort and patient is not tachypenic. No accessory muscle use.  Unlabored breathing Cardiovascular: RRR, no murmurs / rubs / gallops. S1 and S2 auscultated.  Has a right AKA and has some slight lower extremity swelling and left Abdomen: Soft, tender to palpate, distended and he has a colostomy and midline is abdominal incision is covered. Bowel sounds positive but slightly diminished.  GU: Deferred. Musculoskeletal: No clubbing / cyanosis of digits/nails.  Has a right AKA Skin: No rashes, lesions, ulcers on limited skin evaluation. No induration; Warm and dry.  Neurologic: CN 2-12 grossly intact with no focal deficits. Romberg sign and cerebellar reflexes not assessed.  Psychiatric: Normal judgment and insight. Alert and oriented x 3. Normal mood and appropriate affect.   Data Reviewed: I have personally reviewed following labs and imaging studies  CBC: Recent Labs  Lab 12/11/22 0405 12/12/22 0234 12/13/22 0405 12/14/22 0115 12/14/22 1756 12/15/22 0056  WBC 10.7* 11.7* 12.5* 12.2*  --  11.7*   HGB 7.6* 7.4* 7.3* 7.3* 7.8* 8.6*  HCT 26.0* 25.7* 25.2* 25.7* 23.0* 30.5*  MCV 93.2 94.8 98.4 97.0  --  99.0  PLT 189 271 366 391  --  452*   Basic Metabolic Panel: Recent Labs  Lab 12/09/22 0048 12/10/22 0100 12/11/22 0405 12/12/22 0234 12/13/22 0405 12/14/22 0115 12/14/22 1524 12/14/22 1756 12/14/22 2148 12/15/22 0056 12/15/22 1038  NA 139 141 143 147* 143 141 136 138 140 136 137  K 3.5 3.8 3.9 4.6 4.8 5.7* 7.0* 5.5* 5.6* 5.6* 5.3*  CL 102 105 110 111 112* 105 110  --  104 105 107  CO2 26 25 25 24 24  20* 18*  --  22 22 20*  GLUCOSE 121* 120* 148* 222* 194* 110* 130*  --  150* 135* 132*  BUN 48* 49* 49* 59* 60* 54* 56*  --  56* 55* 52*  CREATININE 2.55* 2.55* 2.30* 2.39* 2.27* 2.07* 2.04*  --  2.05* 2.05* 2.15*  CALCIUM 7.9* 7.9* 7.8* 8.2* 7.9* 8.7* 8.0*  --  8.5* 8.5* 8.5*  MG 2.4 2.2 2.2 2.1 2.0  --   --   --   --   --  1.8  PHOS 5.6*  --  5.6* 5.1* 4.3  --   --   --   --   --  6.1*   GFR: Estimated Creatinine Clearance: 43.2 mL/min (A) (by C-G formula based on SCr of 2.15 mg/dL (H)). Liver Function Tests: Recent Labs  Lab 12/15/22 1038  AST 26  ALT 29  ALKPHOS 171*  BILITOT 0.7  PROT 7.1  ALBUMIN 1.9*   No results for input(s): "LIPASE", "AMYLASE" in the last 168 hours. No results for input(s): "AMMONIA" in the last 168 hours. Coagulation Profile: No results for input(s): "INR", "PROTIME" in the last 168 hours. Cardiac Enzymes: No results for input(s): "CKTOTAL", "CKMB", "CKMBINDEX", "TROPONINI" in the last 168 hours. BNP (last 3 results) No results for input(s): "PROBNP" in the last 8760 hours. HbA1C: No results for input(s): "HGBA1C" in the last 72 hours. CBG: Recent Labs  Lab 12/14/22 1524 12/14/22 1933 12/15/22 0816 12/15/22 1124 12/15/22 1618  GLUCAP 108* 125* 114* 117* 173*   Lipid Profile: No results for input(s): "CHOL", "HDL", "LDLCALC", "TRIG", "CHOLHDL", "LDLDIRECT" in the last 72 hours. Thyroid  Function Tests: No results for  input(s): "TSH", "T4TOTAL", "FREET4", "T3FREE", "THYROIDAB" in the last 72 hours. Anemia Panel: No results for input(s): "VITAMINB12", "FOLATE", "FERRITIN", "TIBC", "IRON", "RETICCTPCT" in the last 72 hours. Sepsis Labs: No results for input(s): "PROCALCITON", "LATICACIDVEN" in the last 168 hours.  Recent Results (from the past 240 hour(s))  Culture, blood (Routine X 2) w Reflex to ID Panel     Status: None   Collection Time: 12/06/22  9:02 AM   Specimen: BLOOD RIGHT HAND  Result Value Ref Range Status   Specimen Description BLOOD RIGHT HAND  Final   Special Requests   Final    BOTTLES DRAWN AEROBIC AND ANAEROBIC Blood Culture results may not be optimal due to an inadequate volume of blood received in culture bottles   Culture   Final    NO GROWTH 5 DAYS Performed at [facility], [address], [place], [place] [address]    Report Status 12/11/2022 FINAL  Final  Culture, blood (Routine X 2) w Reflex to ID Panel     Status: None   Collection Time: 12/06/22  9:03 AM   Specimen: BLOOD LEFT HAND  Result Value Ref Range Status   Specimen Description BLOOD LEFT HAND  Final   Special Requests   Final    BOTTLES DRAWN AEROBIC ONLY Blood Culture results may not be optimal due to an inadequate volume of blood received in culture bottles   Culture   Final    NO GROWTH 5 DAYS Performed at [facility], [address] [place], [place] [address]    Report Status 12/11/2022 FINAL  Final  Culture, blood (Routine X 2) w Reflex to ID Panel     Status: None (Preliminary result)   Collection Time: 12/13/22 11:23 AM   Specimen: BLOOD LEFT HAND  Result Value Ref Range Status   Specimen Description BLOOD LEFT HAND  Final   Special Requests   Final    BOTTLES DRAWN AEROBIC AND ANAEROBIC Blood Culture adequate volume   Culture   Final    NO GROWTH 2 DAYS Performed at [facility], [address], [place], [place] [address]    Report Status PENDING  Incomplete  Culture, blood  (Routine X 2) w Reflex to ID Panel     Status: None (Preliminary result)   Collection Time: 12/13/22 11:23 AM   Specimen: BLOOD RIGHT HAND  Result Value Ref Range Status   Specimen Description BLOOD RIGHT HAND  Final   Special Requests   Final    BOTTLES DRAWN AEROBIC AND ANAEROBIC Blood Culture adequate volume   Culture   Final    NO GROWTH 2 DAYS Performed at [facility], [address] [place], [place] [address]    Report Status PENDING  Incomplete    Radiology Studies: No results found.  Scheduled Meds:  acetaminophen  650 mg Oral Q6H   amiodarone  200 mg Oral Daily   arformoterol  15 mcg Nebulization BID   aspirin EC  81 mg Oral Daily   atorvastatin  80 mg Oral Daily   budesonide (PULMICORT) nebulizer solution  0.5 mg Nebulization BID   carvedilol  12.5 mg Oral BID WC   Chlorhexidine Gluconate Cloth  6 each Topical Daily   escitalopram  20 mg Oral Daily   feeding supplement  237 mL Oral BID BM   gabapentin  200 mg Oral Q8H   guaiFENesin  1,200 mg Oral BID  heparin injection (subcutaneous)  5,000 Units Subcutaneous Q8H   hydrALAZINE  10 mg Oral Q8H   insulin aspart  0-20 Units Subcutaneous TID WC   insulin aspart  0-5 Units Subcutaneous QHS   insulin glargine-yfgn  4 Units Subcutaneous BID   leptospermum manuka honey  1 Application Topical Daily   lidocaine  2 patch Transdermal Daily   loperamide  2 mg Oral QID   methadone  10 mg Oral Q8H   methocarbamol  1,000 mg Oral Q8H   multivitamin with minerals  1 tablet Oral Daily   nutrition supplement (JUVEN)  1 packet Oral BID BM   mouth rinse  15 mL Mouth Rinse TID AC & HS   oxyCODONE  20 mg Oral Q4H   pantoprazole  40 mg Oral QHS   QUEtiapine  100 mg Oral QHS   revefenacin  175 mcg Nebulization Daily   sodium zirconium cyclosilicate  10 g Oral TID   tamsulosin  0.4 mg Oral Daily   Continuous Infusions:  sodium chloride Stopped (12/14/22 1901)   ferric gluconate (FERRLECIT) IVPB 135 mL/hr at 12/15/22 1200     LOS: 12 days   Marguerita Merles, DO [facility] Available via Epic secure chat 7am-7pm After these hours, please refer to coverage provider listed on [email or web address] 12/15/2022, 5:55 PM

## 2022-12-15 NOTE — Hospital Course (Addendum)
Patient is a 62 year old obese Caucasian male with a past medical history significant for but not limited to COPD on home oxygen, diabetes mellitus type 2 status post right lower extremity AKA as well as other comorbidities who is a SNF resident at [facility] with frequent admissions to hospital.  On 12/03/2022 he presented to the ED with abdominal pain, urinary continence and hypoxia requiring 4 L nasal cannula.  His lactic acid level was 3 and he was given fluid bolus and repeat lactic acid was 4.  CT was concerning with an ileus with dilatation of the large intestine with stool balls in the distal colon.  General surgery was consulted given his rising lactic acid and concern for mesenteric ischemia.  The plan was to give an enema to clear stool burden with frequent serial examinations.  He was admitted to the critical care service given his elevated WBC is elevated at 28.7 he started on IV vancomycin and Zosyn.  On 12/04/2022 he had worsening lactic acidosis and physical examination and the decision was made to take the patient to the OR for exploration and patient was found to have ischemic colon and underwent a total abdominal colectomy and end ileostomy.  Postoperative setting he presented to ICU and was intubated and was hypotensive requiring multiple pressors including neo-, epi, Levophed and vasopressin along with a bicarbonate drip.  On 12/05/2022 his vasopressor requirements improved and he was extubated.  Current significant hospital events include the following:  Significant Hospital Events: Including procedures, antibiotic start and stop dates in addition to other pertinent events   6/17 admitted, s/p exploratory laparotomy for total colectomy due to ischemic colon 6/17 urine>> EColi>> 6/19 > extubated. A.Fib RVR - re intubated that night 6/22 developed A-fib RVR overnight resulting in need for amiodarone drip and reinitiation of Levophed.   6/23 Required addition amiodarone bolus overnight, NSR  on am exam  6/24 off pressors 6/25 ketamine stopped 6/27 Extubate 6/27 12/14/2018 for his potassium was increased and so case was discussed with nephrology and temporary measures were started on potassium now improving.  On 12/15/2022 he was transferred to the [facility] service.  Will obtain PT and OT to further evaluate and treat and try to get more mobilized.  12/16/22 overnight his blood count dropped and was typed and screened and transfused 1 unit PRBCs.  Will give him a dose of IV Lasix as well given that he sounds congested.  Assessment and Plan:  Septic Shock with E Coli UTI and Ischemic Colitis  -Improved -Off of Pressors -WBC Trend: Recent Labs  Lab 12/10/22 0100 12/11/22 0405 12/12/22 0234 12/13/22 0405 12/14/22 0115 12/15/22 0056 12/16/22 0140  WBC 9.4 10.7* 11.7* 12.5* 12.2* 11.7* 10.1  -Antibiotics completed on 12/09/22  Acute Hypoxic Respiratory Failure and Aspiration Pneumonitis, Improved COPD SpO2: 98 % O2 Flow Rate (L/min): 2 L/min FiO2 (%): 40 % -Continue arformoterol 15 mcg nebs twice daily, budesonide 0.5 mg nebs twice daily, and revefenacin 175 mcg nebs daily -Patient sounds congested so repeat chest x-ray was done and showed "Lordotic positioning noted. Heart size is within normal limits. Low lung volumes noted. Interstitial infiltrates show improvement since previous study, consistent with decreased pneumonitis or edema. No evidence of pulmonary consolidation or pleural effusion. Cervical spine fusion hardware noted." -Will give a dose of IV Lasix 40 mg x1 -Continue with as needed albuterol as necessary -Continue with flutter valve, incentive spirometry and add guaifenesin 1200 mg p.o. twice daily -Repeat chest x-ray in the a.m.  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 -C/w post op care per Surgery, nutrition per surgery   Paroxysmal atrial fibrillation in setting of acute stress. HTN HLD PAD. -Had a Normal EF on Echo from 6/20 -Back in  sinus rhythm -Continue Amiodarone 200 mg po Daily, Carvedilol 12.5 mg po BID, Hydralazine 10 mg po q8h, ASA 81 mg po Daily and Atorvastatin 80 mg po Daily -Patient also has IV hydralazine 10 mg every 6 hours as needed for systolic blood pressure greater than 160 or diastolic blood pressure below 161 -Continue to Monitor BP per Protocol  -Last BP reading was 133/62   AKI from ATN 2nd to sepsis on CKD Stage 3b. Hyperkalemia. Metabolic Acidosis -Baseline creatinine 1.60 from 09/21/22 -BUN/Cr Trend: Recent Labs  Lab 12/13/22 0405 12/14/22 0115 12/14/22 1524 12/14/22 2148 12/15/22 0056 12/15/22 1038 12/16/22 0140  BUN 60* 54* 56* 56* 55* 52* 50*  CREATININE 2.27* 2.07* 2.04* 2.05* 2.05* 2.15* 2.08*  -K+ Trend: Recent Labs  Lab 12/14/22 0115 12/14/22 1524 12/14/22 1756 12/14/22 2148 12/15/22 0056 12/15/22 1038 12/16/22 0140  K 5.7* 7.0* 5.5* 5.6* 5.6* 5.3* 5.0  -Slight metabolic acidosis with a CO2 of 20, anion gap of 11, Chloride Level of 106 -Avoid Nephrotoxic Medications, Contrast Dyes, Hypotension and Dehydration to Ensure Adequate Renal Perfusion and will need to Renally Adjust Meds -Given temporizing measures and was given calcium gluconate 1 g yesterday along with sodium bicarb and 50 medical ejection and is now on Lokelma 10 g 3 times daily for 6 doses and may continue if potassium continues to be elevated tomorrow -Continue to Monitor and Trend Renal Function carefully and repeat CMP in the AM  -Continue to Monitor  urine outpt with high ostomy outpt   Anemia of critical illness and iron deficiency and of Chronic Kidney Disease. -Hgb/Hct Trend: Recent Labs  Lab 12/11/22 0405 12/12/22 0234 12/13/22 0405 12/14/22 0115 12/14/22 1756 12/15/22 0056 12/16/22 0140  HGB 7.6* 7.4* 7.3* 7.3* 7.8* 8.6* 6.6*  HCT 26.0* 25.7* 25.2* 25.7* 23.0* 30.5* 22.4*  MCV 93.2 94.8 98.4 97.0  --  99.0 96.1  -Transfuse for Hb < 7; CBC showed low blood count this AM so was given another 1  unit of pRBC's to make 3 units total (Last transfused 2 units on 12/02/22) -Given IV iron infusion on 6/27 -Checked Anemia Panel and showed an iron level of 24, UIBC 147, TIBC 171, saturation ratios of 14%, ferritin of 647, folate level 10.3, vitamin B12 2427 -Continue to Monitor for S/Sx of Bleeding; No overt bleeding noted -Repeat CBC in the AM   DM Type 2 poorly controlled with Hyperglycemia. -Continue with resistant NovoLog sliding scale insulin before meals and at bedtime as well as Semglee 40 units twice daily -Glucose Level Trend: Recent Labs  Lab 12/13/22 0405 12/14/22 0115 12/14/22 1524 12/14/22 2148 12/15/22 0056 12/15/22 1038 12/16/22 0140  GLUCOSE 194* 110* 130* 150* 135* 132* 156*  -CBG Trend: Recent Labs  Lab 12/14/22 1933 12/15/22 0816 12/15/22 1124 12/15/22 1618 12/15/22 2050 12/16/22 0606 12/16/22 1129  GLUCAP 125* 114* 117* 173* 130* 117* 137*   BPH. -C/w Tamsulosin 0.4 mg Daily    Chronic neuropathic pain. -Dilaudid PCA d/c'eD -methadone started 6/25 >> titrate this up as needed to wean off  oxycodone; reduce oxycodone dose given that he is getting 20 mg every 4 hours not milligrams every 6 as needed as well; now on 10 mg every 6 hours as needed -Continue lexapro, neurontin, lidoderm patch, robaxin, seroquel  Pressure Injuries, poA Pressure Injury 09/05/22 Ankle Anterior;Left Stage 2 -  Partial thickness loss of dermis presenting as a shallow open injury with a red, pink wound bed without slough. full thickness wound, NOT a pressure injury (Active)  09/05/22 2250  Location: Ankle  Location Orientation: Anterior;Left  Staging: Stage 2 -  Partial thickness loss of dermis presenting as a shallow open injury with a red, pink wound bed without slough.  Wound Description (Comments): full thickness wound, NOT a pressure injury  Present on Admission: Yes     Pressure Injury 12/04/22 Buttocks Posterior;Proximal;Right Stage 2 -  Partial thickness loss of dermis  presenting as a shallow open injury with a red, pink wound bed without slough. (Active)  12/04/22 0410  Location: Buttocks  Location Orientation: Posterior;Proximal;Right  Staging: Stage 2 -  Partial thickness loss of dermis presenting as a shallow open injury with a red, pink wound bed without slough.  Wound Description (Comments):   Present on Admission: Yes     Pressure Injury 12/04/22 Thigh Left;Posterior Stage 3 -  Full thickness tissue loss. Subcutaneous fat may be visible but bone, tendon or muscle are NOT exposed. (Active)  12/04/22 0410  Location: Thigh  Location Orientation: Left;Posterior  Staging: Stage 3 -  Full thickness tissue loss. Subcutaneous fat may be visible but bone, tendon or muscle are NOT exposed.  Wound Description (Comments):   Present on Admission: Yes   Hypoalbuminemia -Patient's Albumin Trend: Recent Labs  Lab 12/03/22 1654 12/04/22 0941 12/06/22 0900 12/07/22 1200 12/15/22 1038 12/16/22 0140  ALBUMIN 3.5 2.1* <1.5* 2.3* 1.9* 1.6*  -Continue to Monitor and Trend and repeat CMP in the AM  Obesity -Complicates overall prognosis and care -Estimated body mass index is 29.69 kg/m as calculated from the following:   Height as of this encounter: 6' (1.829 m).   Weight as of this encounter: 99.3 kg.  -Weight Loss and Dietary Counseling given

## 2022-12-15 NOTE — Progress Notes (Signed)
Dr. Cliffton Asters with [facility] says that there was still a lot of stool in him, and it is perfectly normal that this is still happening and be malodorous.

## 2022-12-16 ENCOUNTER — Inpatient Hospital Stay (HOSPITAL_COMMUNITY): Payer: Medicaid Other

## 2022-12-16 DIAGNOSIS — D649 Anemia, unspecified: Secondary | ICD-10-CM

## 2022-12-16 DIAGNOSIS — D72829 Elevated white blood cell count, unspecified: Secondary | ICD-10-CM | POA: Diagnosis not present

## 2022-12-16 DIAGNOSIS — N170 Acute kidney failure with tubular necrosis: Secondary | ICD-10-CM | POA: Diagnosis not present

## 2022-12-16 DIAGNOSIS — K559 Vascular disorder of intestine, unspecified: Secondary | ICD-10-CM | POA: Diagnosis not present

## 2022-12-16 DIAGNOSIS — A419 Sepsis, unspecified organism: Secondary | ICD-10-CM | POA: Diagnosis not present

## 2022-12-16 LAB — PREPARE RBC (CROSSMATCH)

## 2022-12-16 LAB — FOLATE: Folate: 10.3 ng/mL (ref >5.9)

## 2022-12-16 LAB — COMPREHENSIVE METABOLIC PANEL
ALT: 25 U/L (ref 0–44)
AST: 20 U/L (ref 15–41)
Albumin: 1.6 g/dL — ABNORMAL LOW (ref 3.5–5.0)
Alkaline Phosphatase: 151 U/L — ABNORMAL HIGH (ref 38–126)
Anion gap: 11 (ref 5–15)
BUN: 50 mg/dL — ABNORMAL HIGH (ref 8–23)
CO2: 20 mmol/L — ABNORMAL LOW (ref 22–32)
Calcium: 8.1 mg/dL — ABNORMAL LOW (ref 8.9–10.3)
Chloride: 106 mmol/L (ref 98–111)
Creatinine, Ser: 2.08 mg/dL — ABNORMAL HIGH (ref 0.61–1.24)
GFR, Estimated: 35 mL/min — ABNORMAL LOW (ref >60)
Glucose, Bld: 156 mg/dL — ABNORMAL HIGH (ref 70–99)
Potassium: 5 mmol/L (ref 3.5–5.1)
Sodium: 137 mmol/L (ref 135–145)
Total Bilirubin: 0.6 mg/dL (ref 0.3–1.2)
Total Protein: 6.2 g/dL — ABNORMAL LOW (ref 6.5–8.1)

## 2022-12-16 LAB — TYPE AND SCREEN
Antibody Screen: NEGATIVE
Unit division: 0

## 2022-12-16 LAB — CBC WITH DIFFERENTIAL/PLATELET
Abs Immature Granulocytes: 0.11 10*3/uL — ABNORMAL HIGH (ref 0.00–0.07)
Basophils Absolute: 0 10*3/uL (ref 0.0–0.1)
Basophils Relative: 0 %
Eosinophils Absolute: 0.2 10*3/uL (ref 0.0–0.5)
Eosinophils Relative: 2 %
HCT: 22.4 % — ABNORMAL LOW (ref 39.0–52.0)
Hemoglobin: 6.6 g/dL — CL (ref 13.0–17.0)
Immature Granulocytes: 1 %
Lymphocytes Relative: 14 %
Lymphs Abs: 1.4 10*3/uL (ref 0.7–4.0)
MCH: 28.3 pg (ref 26.0–34.0)
MCHC: 29.5 g/dL — ABNORMAL LOW (ref 30.0–36.0)
MCV: 96.1 fL (ref 80.0–100.0)
Monocytes Absolute: 0.8 10*3/uL (ref 0.1–1.0)
Monocytes Relative: 8 %
Neutro Abs: 7.5 10*3/uL (ref 1.7–7.7)
Neutrophils Relative %: 75 %
Platelets: 489 10*3/uL — ABNORMAL HIGH (ref 150–400)
RBC: 2.33 MIL/uL — ABNORMAL LOW (ref 4.22–5.81)
RDW: 15 % (ref 11.5–15.5)
WBC: 10.1 10*3/uL (ref 4.0–10.5)
nRBC: 0.2 % (ref 0.0–0.2)

## 2022-12-16 LAB — MAGNESIUM: Magnesium: 1.6 mg/dL — ABNORMAL LOW (ref 1.7–2.4)

## 2022-12-16 LAB — RETICULOCYTES
Immature Retic Fract: 31.9 % — ABNORMAL HIGH (ref 2.3–15.9)
RBC.: 2.36 MIL/uL — ABNORMAL LOW (ref 4.22–5.81)
Retic Count, Absolute: 27.8 10*3/uL (ref 19.0–186.0)
Retic Ct Pct: 1.2 % (ref 0.4–3.1)

## 2022-12-16 LAB — CULTURE, BLOOD (ROUTINE X 2)

## 2022-12-16 LAB — FERRITIN: Ferritin: 647 ng/mL — ABNORMAL HIGH (ref 24–336)

## 2022-12-16 LAB — VITAMIN B12: Vitamin B-12: 2427 pg/mL — ABNORMAL HIGH (ref 180–914)

## 2022-12-16 LAB — PHOSPHORUS: Phosphorus: 5.6 mg/dL — ABNORMAL HIGH (ref 2.5–4.6)

## 2022-12-16 LAB — GLUCOSE, CAPILLARY
Glucose-Capillary: 117 mg/dL — ABNORMAL HIGH (ref 70–99)
Glucose-Capillary: 137 mg/dL — ABNORMAL HIGH (ref 70–99)
Glucose-Capillary: 206 mg/dL — ABNORMAL HIGH (ref 70–99)
Glucose-Capillary: 171 mg/dL — ABNORMAL HIGH (ref 70–99)

## 2022-12-16 LAB — IRON AND TIBC
Iron: 24 ug/dL — ABNORMAL LOW (ref 45–182)
Saturation Ratios: 14 % — ABNORMAL LOW (ref 17.9–39.5)
TIBC: 171 ug/dL — ABNORMAL LOW (ref 250–450)
UIBC: 147 ug/dL

## 2022-12-16 LAB — BPAM RBC: Blood Product Expiration Date: 202407242359

## 2022-12-16 MED ORDER — OXYCODONE HCL 5 MG PO TABS
10.0000 mg | ORAL_TABLET | Freq: Four times a day (QID) | ORAL | Status: DC | PRN
  Administered 2022-12-16 – 2022-12-20 (×12): 10 mg via ORAL
  Filled 2022-12-16 (×12): qty 2

## 2022-12-16 MED ORDER — FUROSEMIDE 10 MG/ML IJ SOLN
40.0000 mg | Freq: Once | INTRAMUSCULAR | Status: AC
  Administered 2022-12-16: 40 mg via INTRAVENOUS
  Filled 2022-12-16: qty 4

## 2022-12-16 MED ORDER — SODIUM CHLORIDE 0.9% IV SOLUTION: Freq: Once | INTRAVENOUS | Status: AC

## 2022-12-16 NOTE — Plan of Care (Signed)
  Problem: Education: Goal: Ability to describe self-care measures that may prevent or decrease complications (Diabetes Survival Skills Education) will improve Outcome: Progressing Goal: Individualized Educational Video(s) Outcome: Progressing   Problem: Coping: Goal: Ability to adjust to condition or change in health will improve Outcome: Progressing   Problem: Fluid Volume: Goal: Ability to maintain a balanced intake and output will improve Outcome: Progressing   Problem: Health Behavior/Discharge Planning: Goal: Ability to identify and utilize available resources and services will improve Outcome: Progressing Goal: Ability to manage health-related needs will improve Outcome: Progressing   Problem: Metabolic: Goal: Ability to maintain appropriate glucose levels will improve Outcome: Progressing   Problem: Nutritional: Goal: Maintenance of adequate nutrition will improve Outcome: Progressing Goal: Progress toward achieving an optimal weight will improve Outcome: Progressing   Problem: Skin Integrity: Goal: Risk for impaired skin integrity will decrease Outcome: Progressing   Problem: Tissue Perfusion: Goal: Adequacy of tissue perfusion will improve Outcome: Progressing   Problem: Education: Goal: Knowledge of General Education information will improve Description: Including pain rating scale, medication(s)/side effects and non-pharmacologic comfort measures Outcome: Progressing   Problem: Health Behavior/Discharge Planning: Goal: Ability to manage health-related needs will improve Outcome: Progressing   Problem: Clinical Measurements: Goal: Ability to maintain clinical measurements within normal limits will improve Outcome: Progressing Goal: Will remain free from infection Outcome: Progressing Goal: Diagnostic test results will improve Outcome: Progressing Goal: Respiratory complications will improve Outcome: Progressing Goal: Cardiovascular complication will  be avoided Outcome: Progressing   Problem: Activity: Goal: Risk for activity intolerance will decrease Outcome: Progressing   Problem: Nutrition: Goal: Adequate nutrition will be maintained Outcome: Progressing   Problem: Coping: Goal: Level of anxiety will decrease Outcome: Progressing   Problem: Elimination: Goal: Will not experience complications related to bowel motility Outcome: Progressing Goal: Will not experience complications related to urinary retention Outcome: Progressing   Problem: Pain Managment: Goal: General experience of comfort will improve Outcome: Progressing   Problem: Safety: Goal: Ability to remain free from injury will improve Outcome: Progressing   Problem: Skin Integrity: Goal: Risk for impaired skin integrity will decrease Outcome: Progressing   Problem: Safety: Goal: Non-violent Restraint(s) Outcome: Progressing   

## 2022-12-16 NOTE — Evaluation (Signed)
Occupational Therapy Evaluation Patient Details Name: Gregory [name] MRN: [id number] DOB: 1960-09-13 Today's Date: 12/16/2022   History of Present Illness Gregory [name] is a 62 y.o. man admitted 6/17 with abdominal pain, and urinary incontinence.  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 CT revealed right occipital infarct. 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   Clinical Impression   Pt reports independence at baseline with transfers to w/c and bed mobility, ind with ADLs. Pt currently limited by abdominal pain, needing min-total A for ADLs, total A +2 for bed mobility for pad change. Pt declines further mobility. Pt presenting with impairments listed below, will follow acutely. Patient will benefit from continued inpatient follow up therapy, <3 hours/day to maximize safety/ind with ADLs/functional mobility.       Recommendations for follow up therapy are one component of a multi-disciplinary discharge planning process, led by the attending physician.  Recommendations may be updated based on patient status, additional functional criteria and insurance authorization.   Assistance Recommended at Discharge Frequent or constant Supervision/Assistance  Patient can return home with the following Two people to help with walking and/or transfers;A lot of help with bathing/dressing/bathroom;Assistance with cooking/housework;Direct supervision/assist for medications management;Direct supervision/assist for financial management;Help with stairs or ramp for entrance;Assist for transportation    Functional Status Assessment  Patient has had a recent decline in their functional status and demonstrates the ability to make significant  improvements in function in a reasonable and predictable amount of time.  Equipment Recommendations  Other (comment) (defer)    Recommendations for Other Services PT consult     Precautions / Restrictions Precautions Precautions: Fall Precaution Comments: JP drain, colectomy with abdominal incision, 2LO2 Restrictions Weight Bearing Restrictions: No      Mobility Bed Mobility Overal bed mobility: Needs Assistance Bed Mobility: Rolling Rolling: Total assist, +2 for physical assistance         General bed mobility comments: rolling for pad placement/pericare    Transfers                   General transfer comment: pt declines      Balance                                           ADL either performed or assessed with clinical judgement   ADL Overall ADL's : Needs assistance/impaired Eating/Feeding: Set up;Bed level   Grooming: Minimal assistance;Bed level   Upper Body Bathing: Bed level;Moderate assistance   Lower Body Bathing: Maximal assistance;Bed level   Upper Body Dressing : Moderate assistance;Bed level   Lower Body Dressing: Maximal assistance;Bed level   Toilet Transfer: Maximal assistance;+2 for physical assistance   Toileting- Clothing Manipulation and Hygiene: Total assistance       Functional mobility during ADLs: Maximal assistance;+2 for physical assistance       Vision Patient Visual Report: No change from baseline Additional Comments: 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     Perception Perception Perception Tested?: No   Praxis Praxis Praxis tested?: Not tested    Pertinent Vitals/Pain Pain Assessment Pain Assessment: Faces Pain Score: 10-Worst pain ever Faces Pain Scale: Hurts worst Pain  Location: abdomen Pain Descriptors / Indicators: Sore, Discomfort Pain Intervention(s): Limited activity within patient's tolerance, Monitored during session     Hand Dominance Right   Extremity/Trunk Assessment Upper  Extremity Assessment Upper Extremity Assessment: Overall WFL for tasks assessed   Lower Extremity Assessment Lower Extremity Assessment: Defer to PT evaluation   Cervical / Trunk Assessment Cervical / Trunk Assessment: Other exceptions   Communication Communication Communication: No difficulties   Cognition Arousal/Alertness: Awake/alert Behavior During Therapy: Flat affect Overall Cognitive Status: Impaired/Different from baseline                                       General Comments  VSS on 2L O2    Exercises     Shoulder Instructions      Home Living                                   Additional Comments: from [facility]      Prior Functioning/Environment Prior Level of Function : Needs assist             Mobility Comments: pt reports getting to EOB and scooting to motorized w/c without assist ADLs Comments: pt reports doing most of dressing/bathing himself, meals brought to pt's room, facility managed meds        OT Problem List: Decreased strength;Decreased range of motion;Decreased activity tolerance;Impaired balance (sitting and/or standing);Pain;Decreased safety awareness;Impaired vision/perception      OT Treatment/Interventions: Self-care/ADL training;Therapeutic exercise;Energy conservation;DME and/or AE instruction;Therapeutic activities;Patient/family education;Balance training    OT Goals(Current goals can be found in the care plan section) Acute Rehab OT Goals Patient Stated Goal: none stated OT Goal Formulation: With patient Time For Goal Achievement: 12/24/22 Potential to Achieve Goals: Good ADL Goals Pt Will Perform Grooming: with supervision;sitting Pt Will Transfer to Toilet: with mod assist;with +2 assist;squat pivot transfer;stand pivot transfer;bedside commode Additional ADL Goal #1: pt will perform bed mobility mod A in prep for ADLs  OT Frequency: Min 1X/week    Co-evaluation               AM-PAC OT "6 Clicks" Daily Activity     Outcome Measure Help from another person eating meals?: A Little Help from another person taking care of personal grooming?: A Little Help from another person toileting, which includes using toliet, bedpan, or urinal?: Total Help from another person bathing (including washing, rinsing, drying)?: A Lot Help from another person to put on and taking off regular upper body clothing?: A Lot Help from another person to put on and taking off regular lower body clothing?: A Lot 6 Click Score: 13   End of Session Equipment Utilized During Treatment: Oxygen (2L) Nurse Communication: Mobility status  Activity Tolerance: Patient tolerated treatment well Patient left: in bed;with call bell/phone within reach;with bed alarm set;with nursing/sitter in room  OT Visit Diagnosis: Muscle weakness (generalized) (M62.81)                Time: 1610-9604 OT Time Calculation (min): 24 min Charges:  OT General Charges $OT Visit: 1 Visit OT Evaluation $OT Eval Moderate Complexity: 1 Mod OT Treatments $Therapeutic Activity: 8-22 mins  Carver Fila, OTD, OTR/L SecureChat Preferred Acute Rehab [phone]   Jowana Thumma K Koonce 12/16/2022, 12:20 PM

## 2022-12-16 NOTE — Progress Notes (Signed)
Hgb is 6.6 this morning. Plan to transfuse 1 unit RBC.

## 2022-12-16 NOTE — Progress Notes (Signed)
PROGRESS NOTE    Gregory [name]  ZOX:[id number] DOB: [birth year] DOA: 12/03/2022 PCP: Sherron Monday, MD   Brief Narrative:  Patient is a 62 year old obese Caucasian male with a past medical history significant for but not limited to COPD on home oxygen, diabetes mellitus type 2 status post right lower extremity AKA as well as other comorbidities who is a SNF resident at [facility] with frequent admissions to hospital.  On 12/03/2022 he presented to the ED with abdominal pain, urinary continence and hypoxia requiring 4 L nasal cannula.  His lactic acid level was 3 and he was given fluid bolus and repeat lactic acid was 4.  CT was concerning with an ileus with dilatation of the large intestine with stool balls in the distal colon.  General surgery was consulted given his rising lactic acid and concern for mesenteric ischemia.  The plan was to give an enema to clear stool burden with frequent serial examinations.  He was admitted to the critical care service given his elevated WBC is elevated at 28.7 he started on IV vancomycin and Zosyn.  On 12/04/2022 he had worsening lactic acidosis and physical examination and the decision was made to take the patient to the OR for exploration and patient was found to have ischemic colon and underwent a total abdominal colectomy and end ileostomy.  Postoperative setting he presented to ICU and was intubated and was hypotensive requiring multiple pressors including neo-, epi, Levophed and vasopressin along with a bicarbonate drip.  On 12/05/2022 his vasopressor requirements improved and he was extubated.  Current significant hospital events include the following:  Significant Hospital Events: Including procedures, antibiotic start and stop dates in addition to other pertinent events   6/17 admitted, s/p exploratory laparotomy for total colectomy due to ischemic colon 6/17 urine>> EColi>> 6/19 > extubated. A.Fib RVR - re intubated that night 6/22 developed A-fib RVR  overnight resulting in need for amiodarone drip and reinitiation of Levophed.   6/23 Required addition amiodarone bolus overnight, NSR on am exam  6/24 off pressors 6/25 ketamine stopped 6/27 Extubate 6/27 12/14/2018 for his potassium was increased and so case was discussed with nephrology and temporary measures were started on potassium now improving.  On 12/15/2022 he was transferred to the [facility] service.  Will obtain PT and OT to further evaluate and treat and try to get more mobilized.  12/16/22 overnight his blood count dropped and was typed and screened and transfused 1 unit PRBCs.  Will give him a dose of IV Lasix as well given that he sounds congested.  Assessment and Plan:  Septic Shock with E Coli UTI and Ischemic Colitis  -Improved -Off of Pressors -WBC Trend: Recent Labs  Lab 12/10/22 0100 12/11/22 0405 12/12/22 0234 12/13/22 0405 12/14/22 0115 12/15/22 0056 12/16/22 0140  WBC 9.4 10.7* 11.7* 12.5* 12.2* 11.7* 10.1  -Antibiotics completed on 12/09/22  Acute Hypoxic Respiratory Failure and Aspiration Pneumonitis, Improved COPD SpO2: 98 % O2 Flow Rate (L/min): 2 L/min FiO2 (%): 40 % -Continue arformoterol 15 mcg nebs twice daily, budesonide 0.5 mg nebs twice daily, and revefenacin 175 mcg nebs daily -Patient sounds congested so repeat chest x-ray was done and showed "Lordotic positioning noted. Heart size is within normal limits. Low lung volumes noted. Interstitial infiltrates show improvement since previous study, consistent with decreased pneumonitis or edema. No evidence of pulmonary consolidation or pleural effusion. Cervical spine fusion hardware noted." -Will give a dose of IV Lasix 40 mg x1 -Continue with as needed albuterol  as necessary -Continue with flutter valve, incentive spirometry and add guaifenesin 1200 mg p.o. twice daily -Repeat chest x-ray in the a.m.  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 -C/w post op care per Surgery, nutrition  per surgery   Paroxysmal atrial fibrillation in setting of acute stress. HTN HLD PAD. -Had a Normal EF on Echo from 6/20 -Back in sinus rhythm -Continue Amiodarone 200 mg po Daily, Carvedilol 12.5 mg po BID, Hydralazine 10 mg po q8h, ASA 81 mg po Daily and Atorvastatin 80 mg po Daily -Patient also has IV hydralazine 10 mg every 6 hours as needed for systolic blood pressure greater than 160 or diastolic blood pressure below 161 -Continue to Monitor BP per Protocol  -Last BP reading was 133/62   AKI from ATN 2nd to sepsis on CKD Stage 3b. Hyperkalemia. Metabolic Acidosis -Baseline creatinine 1.60 from 09/21/22 -BUN/Cr Trend: Recent Labs  Lab 12/13/22 0405 12/14/22 0115 12/14/22 1524 12/14/22 2148 12/15/22 0056 12/15/22 1038 12/16/22 0140  BUN 60* 54* 56* 56* 55* 52* 50*  CREATININE 2.27* 2.07* 2.04* 2.05* 2.05* 2.15* 2.08*  -K+ Trend: Recent Labs  Lab 12/14/22 0115 12/14/22 1524 12/14/22 1756 12/14/22 2148 12/15/22 0056 12/15/22 1038 12/16/22 0140  K 5.7* 7.0* 5.5* 5.6* 5.6* 5.3* 5.0  -Slight metabolic acidosis with a CO2 of 20, anion gap of 11, Chloride Level of 106 -Avoid Nephrotoxic Medications, Contrast Dyes, Hypotension and Dehydration to Ensure Adequate Renal Perfusion and will need to Renally Adjust Meds -Given temporizing measures and was given calcium gluconate 1 g yesterday along with sodium bicarb and 50 medical ejection and is now on Lokelma 10 g 3 times daily for 6 doses and may continue if potassium continues to be elevated tomorrow -Continue to Monitor and Trend Renal Function carefully and repeat CMP in the AM  -Continue to Monitor  urine outpt with high ostomy outpt   Anemia of critical illness and iron deficiency and of Chronic Kidney Disease. -Hgb/Hct Trend: Recent Labs  Lab 12/11/22 0405 12/12/22 0234 12/13/22 0405 12/14/22 0115 12/14/22 1756 12/15/22 0056 12/16/22 0140  HGB 7.6* 7.4* 7.3* 7.3* 7.8* 8.6* 6.6*  HCT 26.0* 25.7* 25.2* 25.7*  23.0* 30.5* 22.4*  MCV 93.2 94.8 98.4 97.0  --  99.0 96.1  -Transfuse for Hb < 7; CBC showed low blood count this AM so was given another 1 unit of pRBC's to make 3 units total (Last transfused 2 units on 12/02/22) -Given IV iron infusion on 6/27 -Checked Anemia Panel and showed an iron level of 24, UIBC 147, TIBC 171, saturation ratios of 14%, ferritin of 647, folate level 10.3, vitamin B12 2427 -Continue to Monitor for S/Sx of Bleeding; No overt bleeding noted -Repeat CBC in the AM   DM Type 2 poorly controlled with Hyperglycemia. -Continue with resistant NovoLog sliding scale insulin before meals and at bedtime as well as Semglee 40 units twice daily -Glucose Level Trend: Recent Labs  Lab 12/13/22 0405 12/14/22 0115 12/14/22 1524 12/14/22 2148 12/15/22 0056 12/15/22 1038 12/16/22 0140  GLUCOSE 194* 110* 130* 150* 135* 132* 156*  -CBG Trend: Recent Labs  Lab 12/14/22 1933 12/15/22 0816 12/15/22 1124 12/15/22 1618 12/15/22 2050 12/16/22 0606 12/16/22 1129  GLUCAP 125* 114* 117* 173* 130* 117* 137*   BPH. -C/w Tamsulosin 0.4 mg Daily    Chronic neuropathic pain. -Dilaudid PCA d/c'eD -methadone started 6/25 >> titrate this up as needed to wean off  oxycodone; reduce oxycodone dose given that he is getting 20 mg every 4 hours  not milligrams every 6 as needed as well; now on 10 mg every 6 hours as needed -Continue lexapro, neurontin, lidoderm patch, robaxin, seroquel  Pressure Injuries, poA Pressure Injury 09/05/22 Ankle Anterior;Left Stage 2 -  Partial thickness loss of dermis presenting as a shallow open injury with a red, pink wound bed without slough. full thickness wound, NOT a pressure injury (Active)  09/05/22 2250  Location: Ankle  Location Orientation: Anterior;Left  Staging: Stage 2 -  Partial thickness loss of dermis presenting as a shallow open injury with a red, pink wound bed without slough.  Wound Description (Comments): full thickness wound, NOT a pressure  injury  Present on Admission: Yes     Pressure Injury 12/04/22 Buttocks Posterior;Proximal;Right Stage 2 -  Partial thickness loss of dermis presenting as a shallow open injury with a red, pink wound bed without slough. (Active)  12/04/22 0410  Location: Buttocks  Location Orientation: Posterior;Proximal;Right  Staging: Stage 2 -  Partial thickness loss of dermis presenting as a shallow open injury with a red, pink wound bed without slough.  Wound Description (Comments):   Present on Admission: Yes     Pressure Injury 12/04/22 Thigh Left;Posterior Stage 3 -  Full thickness tissue loss. Subcutaneous fat may be visible but bone, tendon or muscle are NOT exposed. (Active)  12/04/22 0410  Location: Thigh  Location Orientation: Left;Posterior  Staging: Stage 3 -  Full thickness tissue loss. Subcutaneous fat may be visible but bone, tendon or muscle are NOT exposed.  Wound Description (Comments):   Present on Admission: Yes   Hypoalbuminemia -Patient's Albumin Trend: Recent Labs  Lab 12/03/22 1654 12/04/22 0941 12/06/22 0900 12/07/22 1200 12/15/22 1038 12/16/22 0140  ALBUMIN 3.5 2.1* <1.5* 2.3* 1.9* 1.6*  -Continue to Monitor and Trend and repeat CMP in the AM  Obesity -Complicates overall prognosis and care -Estimated body mass index is 29.69 kg/m as calculated from the following:   Height as of this encounter: 6' (1.829 m).   Weight as of this encounter: 99.3 kg.  -Weight Loss and Dietary Counseling given   DVT prophylaxis: heparin injection 5,000 Units Start: 12/08/22 1400    Code Status: Full Code Family Communication: No family present at bedside  Disposition Plan:  Level of care: Progressive Status is: Inpatient Remains inpatient appropriate because: Needs further clinical improvement and getting a unit of blood today.  Will need further respiratory status improvement and more mobilization   Consultants:  PCCM transfer  Procedures:  As delineated as  above  Antimicrobials:  Anti-infectives (From admission, onward)    Start     Dose/Rate Route Frequency Ordered Stop   12/05/22 1800  vancomycin (VANCOREADY) IVPB 1250 mg/250 mL  Status:  Discontinued        1,250 mg 166.7 mL/hr over 90 Minutes Intravenous Every 48 hours 12/03/22 2346 12/08/22 1148   12/05/22 1300  meropenem (MERREM) 1 g in sodium chloride 0.9 % 100 mL IVPB        1 g 200 mL/hr over 30 Minutes Intravenous Every 12 hours 12/05/22 1201 12/10/22 0700   12/04/22 0100  piperacillin-tazobactam (ZOSYN) IVPB 3.375 g  Status:  Discontinued        3.375 g 12.5 mL/hr over 240 Minutes Intravenous Every 8 hours 12/03/22 2346 12/05/22 1159   12/03/22 1700  vancomycin (VANCOREADY) IVPB 2000 mg/400 mL        2,000 mg 200 mL/hr over 120 Minutes Intravenous  Once 12/03/22 1657 12/03/22 2020   12/03/22 1700  piperacillin-tazobactam (  ZOSYN) IVPB 3.375 g        3.375 g 100 mL/hr over 30 Minutes Intravenous  Once 12/03/22 1657 12/03/22 1804       Subjective: Seen and examined at bedside and thinks he is doing okay.  Blood count had dropped so he is typed and screened transfuse 1 unit of PRBCs.  Continues to sound congested but thinks he is feeling okay and doing okay and thinks his colostomy is working.  Continues to have some abdominal discomfort.  Objective: Vitals:   12/16/22 1019 12/16/22 1130 12/16/22 1300 12/16/22 1638  BP: (!) 143/65 (!) 154/67  133/62  Pulse: 70 72 73 69  Resp: 18 (!) 23 20 20   Temp: 97.6 F (36.4 C) 98.7 F (37.1 C)  98.8 F (37.1 C)  TempSrc: Oral Axillary  Oral  SpO2: 98% 92% 96% 98%  Weight:      Height:        Intake/Output Summary (Last 24 hours) at 12/16/2022 1708 Last data filed at 12/16/2022 1638 Gross per 24 hour  Intake 1736 ml  Output 2760 ml  Net -1024 ml   Filed Weights   12/14/22 0434 12/15/22 0500 12/16/22 0429  Weight: 99.1 kg 100.9 kg 99.3 kg   Examination: Physical Exam:  Constitutional: WN/WD overweight chronically  ill-appearing Caucasian male who is resting Respiratory: Diminished to auscultation bilaterally with some coarse breath sounds and has some rhonchi and crackles but no appreciable wheezing or rales. Normal respiratory effort and patient is not tachypenic. No accessory muscle use.  Wearing supplemental oxygen via nasal cannula Cardiovascular: RRR, no murmurs / rubs / gallops. S1 and S2 auscultated.  Has a right AKA and has some lower extremity edema on the left Abdomen: Soft, tender to palpation is distended has a colostomy midline abdominal incision. Bowel sounds positive.  GU: Deferred. Musculoskeletal: Right AKA Skin: No rashes, lesions, ulcers. No induration; Warm and dry.  Neurologic: CN 2-12 grossly intact with no focal deficits. Romberg sign and cerebellar reflexes not assessed.  Psychiatric: Normal judgment and insight. Alert and oriented x 3. Normal mood and appropriate affect.   Data Reviewed: I have personally reviewed following labs and imaging studies  CBC: Recent Labs  Lab 12/12/22 0234 12/13/22 0405 12/14/22 0115 12/14/22 1756 12/15/22 0056 12/16/22 0140  WBC 11.7* 12.5* 12.2*  --  11.7* 10.1  NEUTROABS  --   --   --   --   --  7.5  HGB 7.4* 7.3* 7.3* 7.8* 8.6* 6.6*  HCT 25.7* 25.2* 25.7* 23.0* 30.5* 22.4*  MCV 94.8 98.4 97.0  --  99.0 96.1  PLT 271 366 391  --  452* 489*   Basic Metabolic Panel: Recent Labs  Lab 12/11/22 0405 12/12/22 0234 12/13/22 0405 12/14/22 0115 12/14/22 1524 12/14/22 1756 12/14/22 2148 12/15/22 0056 12/15/22 1038 12/16/22 0140  NA 143 147* 143   < > 136 138 140 136 137 137  K 3.9 4.6 4.8   < > 7.0* 5.5* 5.6* 5.6* 5.3* 5.0  CL 110 111 112*   < > 110  --  104 105 107 106  CO2 25 24 24    < > 18*  --  22 22 20* 20*  GLUCOSE 148* 222* 194*   < > 130*  --  150* 135* 132* 156*  BUN 49* 59* 60*   < > 56*  --  56* 55* 52* 50*  CREATININE 2.30* 2.39* 2.27*   < > 2.04*  --  2.05* 2.05* 2.15* 2.08*  CALCIUM 7.8* 8.2* 7.9*   < > 8.0*  --  8.5*  8.5* 8.5* 8.1*  MG 2.2 2.1 2.0  --   --   --   --   --  1.8 1.6*  PHOS 5.6* 5.1* 4.3  --   --   --   --   --  6.1* 5.6*   < > = values in this interval not displayed.   GFR: Estimated Creatinine Clearance: 44.4 mL/min (A) (by C-G formula based on SCr of 2.08 mg/dL (H)). Liver Function Tests: Recent Labs  Lab 12/15/22 1038 12/16/22 0140  AST 26 20  ALT 29 25  ALKPHOS 171* 151*  BILITOT 0.7 0.6  PROT 7.1 6.2*  ALBUMIN 1.9* 1.6*   No results for input(s): "LIPASE", "AMYLASE" in the last 168 hours. No results for input(s): "AMMONIA" in the last 168 hours. Coagulation Profile: No results for input(s): "INR", "PROTIME" in the last 168 hours. Cardiac Enzymes: No results for input(s): "CKTOTAL", "CKMB", "CKMBINDEX", "TROPONINI" in the last 168 hours. BNP (last 3 results) No results for input(s): "PROBNP" in the last 8760 hours. HbA1C: No results for input(s): "HGBA1C" in the last 72 hours. CBG: Recent Labs  Lab 12/15/22 1124 12/15/22 1618 12/15/22 2050 12/16/22 0606 12/16/22 1129  GLUCAP 117* 173* 130* 117* 137*   Lipid Profile: No results for input(s): "CHOL", "HDL", "LDLCALC", "TRIG", "CHOLHDL", "LDLDIRECT" in the last 72 hours. Thyroid Function Tests: No results for input(s): "TSH", "T4TOTAL", "FREET4", "T3FREE", "THYROIDAB" in the last 72 hours. Anemia Panel: Recent Labs    12/16/22 0140  VITAMINB12 2,427*  FOLATE 10.3  FERRITIN 647*  TIBC 171*  IRON 24*  RETICCTPCT 1.2   Sepsis Labs: No results for input(s): "PROCALCITON", "LATICACIDVEN" in the last 168 hours.  Recent Results (from the past 240 hour(s))  Culture, blood (Routine X 2) w Reflex to ID Panel     Status: None (Preliminary result)   Collection Time: 12/13/22 11:23 AM   Specimen: BLOOD LEFT HAND  Result Value Ref Range Status   Specimen Description BLOOD LEFT HAND  Final   Special Requests   Final    BOTTLES DRAWN AEROBIC AND ANAEROBIC Blood Culture adequate volume   Culture   Final    NO GROWTH  3 DAYS Performed at [facility], [address], [place], [place] [address]    Report Status PENDING  Incomplete  Culture, blood (Routine X 2) w Reflex to ID Panel     Status: None (Preliminary result)   Collection Time: 12/13/22 11:23 AM   Specimen: BLOOD RIGHT HAND  Result Value Ref Range Status   Specimen Description BLOOD RIGHT HAND  Final   Special Requests   Final    BOTTLES DRAWN AEROBIC AND ANAEROBIC Blood Culture adequate volume   Culture   Final    NO GROWTH 3 DAYS Performed at [facility], [address] [place], [place] [address]    Report Status PENDING  Incomplete    Radiology Studies: DG CHEST PORT 1 VIEW  Result Date: 12/16/2022 CLINICAL DATA:  Shortness of breath. EXAM: PORTABLE CHEST 1 VIEW COMPARISON:  12/13/2022 FINDINGS: Lordotic positioning noted. Heart size is within normal limits. Low lung volumes noted. Interstitial infiltrates show improvement since previous study, consistent with decreased pneumonitis or edema. No evidence of pulmonary consolidation or pleural effusion. Cervical spine fusion hardware noted. IMPRESSION: Low lung volumes noted. Improving interstitial infiltrates, consistent with decreased pneumonitis or edema. Electronically Signed   By: Mayra Neer  Eppie Gibson M.D.   On: 12/16/2022 10:28   Scheduled Meds:  acetaminophen  650 mg Oral Q6H   amiodarone  200 mg Oral Daily   arformoterol  15 mcg Nebulization BID   aspirin EC  81 mg Oral Daily   atorvastatin  80 mg Oral Daily   budesonide (PULMICORT) nebulizer solution  0.5 mg Nebulization BID   carvedilol  12.5 mg Oral BID WC   Chlorhexidine Gluconate Cloth  6 each Topical Daily   escitalopram  20 mg Oral Daily   feeding supplement  237 mL Oral BID BM   furosemide  40 mg Intravenous Once   gabapentin  200 mg Oral Q8H   guaiFENesin  1,200 mg Oral BID   heparin injection (subcutaneous)  5,000 Units Subcutaneous Q8H   hydrALAZINE  10 mg Oral Q8H   insulin aspart  0-20 Units Subcutaneous TID  WC   insulin aspart  0-5 Units Subcutaneous QHS   insulin glargine-yfgn  4 Units Subcutaneous BID   leptospermum manuka honey  1 Application Topical Daily   lidocaine  2 patch Transdermal Daily   loperamide  2 mg Oral QID   methadone  10 mg Oral Q8H   methocarbamol  1,000 mg Oral Q8H   multivitamin with minerals  1 tablet Oral Daily   nutrition supplement (JUVEN)  1 packet Oral BID BM   mouth rinse  15 mL Mouth Rinse TID AC & HS   pantoprazole  40 mg Oral QHS   QUEtiapine  100 mg Oral QHS   revefenacin  175 mcg Nebulization Daily   tamsulosin  0.4 mg Oral Daily   Continuous Infusions:  sodium chloride Stopped (12/14/22 1901)    LOS: 13 days   Marguerita Merles, DO [facility] Available via Epic secure chat 7am-7pm After these hours, please refer to coverage provider listed on [email or web address] 12/16/2022, 5:08 PM

## 2022-12-17 ENCOUNTER — Inpatient Hospital Stay (HOSPITAL_COMMUNITY): Payer: Medicaid Other

## 2022-12-17 DIAGNOSIS — A419 Sepsis, unspecified organism: Secondary | ICD-10-CM | POA: Diagnosis not present

## 2022-12-17 DIAGNOSIS — K559 Vascular disorder of intestine, unspecified: Secondary | ICD-10-CM | POA: Diagnosis not present

## 2022-12-17 DIAGNOSIS — D72829 Elevated white blood cell count, unspecified: Secondary | ICD-10-CM | POA: Diagnosis not present

## 2022-12-17 DIAGNOSIS — N170 Acute kidney failure with tubular necrosis: Secondary | ICD-10-CM | POA: Diagnosis not present

## 2022-12-17 LAB — CBC WITH DIFFERENTIAL/PLATELET
Abs Immature Granulocytes: 0.14 10*3/uL — ABNORMAL HIGH (ref 0.00–0.07)
Basophils Absolute: 0.1 10*3/uL (ref 0.0–0.1)
Basophils Relative: 0 %
Eosinophils Absolute: 0.3 10*3/uL (ref 0.0–0.5)
Eosinophils Relative: 2 %
HCT: 24.1 % — ABNORMAL LOW (ref 39.0–52.0)
Hemoglobin: 7 g/dL — ABNORMAL LOW (ref 13.0–17.0)
Immature Granulocytes: 1 %
Lymphocytes Relative: 13 %
Lymphs Abs: 1.6 10*3/uL (ref 0.7–4.0)
MCH: 27.7 pg (ref 26.0–34.0)
MCHC: 29 g/dL — ABNORMAL LOW (ref 30.0–36.0)
MCV: 95.3 fL (ref 80.0–100.0)
Monocytes Absolute: 1.1 10*3/uL — ABNORMAL HIGH (ref 0.1–1.0)
Monocytes Relative: 9 %
Neutro Abs: 9 10*3/uL — ABNORMAL HIGH (ref 1.7–7.7)
Neutrophils Relative %: 75 %
Platelets: 492 10*3/uL — ABNORMAL HIGH (ref 150–400)
RBC: 2.53 MIL/uL — ABNORMAL LOW (ref 4.22–5.81)
RDW: 14.8 % (ref 11.5–15.5)
WBC: 12.1 10*3/uL — ABNORMAL HIGH (ref 4.0–10.5)
nRBC: 0.2 % (ref 0.0–0.2)

## 2022-12-17 LAB — COMPREHENSIVE METABOLIC PANEL
ALT: 21 U/L (ref 0–44)
AST: 17 U/L (ref 15–41)
Albumin: 1.5 g/dL — ABNORMAL LOW (ref 3.5–5.0)
Alkaline Phosphatase: 141 U/L — ABNORMAL HIGH (ref 38–126)
Anion gap: 9 (ref 5–15)
BUN: 45 mg/dL — ABNORMAL HIGH (ref 8–23)
CO2: 20 mmol/L — ABNORMAL LOW (ref 22–32)
Calcium: 8.1 mg/dL — ABNORMAL LOW (ref 8.9–10.3)
Chloride: 106 mmol/L (ref 98–111)
Creatinine, Ser: 2.12 mg/dL — ABNORMAL HIGH (ref 0.61–1.24)
GFR, Estimated: 34 mL/min — ABNORMAL LOW (ref >60)
Glucose, Bld: 131 mg/dL — ABNORMAL HIGH (ref 70–99)
Potassium: 4.5 mmol/L (ref 3.5–5.1)
Sodium: 135 mmol/L (ref 135–145)
Total Bilirubin: 0.7 mg/dL (ref 0.3–1.2)
Total Protein: 5.9 g/dL — ABNORMAL LOW (ref 6.5–8.1)

## 2022-12-17 LAB — GLUCOSE, CAPILLARY
Glucose-Capillary: 102 mg/dL — ABNORMAL HIGH (ref 70–99)
Glucose-Capillary: 194 mg/dL — ABNORMAL HIGH (ref 70–99)
Glucose-Capillary: 145 mg/dL — ABNORMAL HIGH (ref 70–99)
Glucose-Capillary: 127 mg/dL — ABNORMAL HIGH (ref 70–99)

## 2022-12-17 LAB — BPAM RBC
ISSUE DATE / TIME: 202406300628
ISSUE DATE / TIME: 202407011016
Unit Type and Rh: 5100

## 2022-12-17 LAB — HEMOGLOBIN AND HEMATOCRIT, BLOOD
HCT: 27.3 % — ABNORMAL LOW (ref 39.0–52.0)
Hemoglobin: 8.5 g/dL — ABNORMAL LOW (ref 13.0–17.0)

## 2022-12-17 LAB — TYPE AND SCREEN

## 2022-12-17 LAB — PHOSPHORUS: Phosphorus: 5.7 mg/dL — ABNORMAL HIGH (ref 2.5–4.6)

## 2022-12-17 LAB — PREPARE RBC (CROSSMATCH)

## 2022-12-17 LAB — MAGNESIUM: Magnesium: 1.5 mg/dL — ABNORMAL LOW (ref 1.7–2.4)

## 2022-12-17 LAB — CULTURE, BLOOD (ROUTINE X 2): Special Requests: ADEQUATE

## 2022-12-17 MED ORDER — MAGNESIUM SULFATE 2 GM/50ML IV SOLN
2.0000 g | Freq: Once | INTRAVENOUS | Status: AC
  Administered 2022-12-17: 2 g via INTRAVENOUS
  Filled 2022-12-17: qty 50

## 2022-12-17 MED ORDER — SODIUM CHLORIDE 0.9% IV SOLUTION: Freq: Once | INTRAVENOUS | Status: AC

## 2022-12-17 MED ORDER — FUROSEMIDE 10 MG/ML IJ SOLN
40.0000 mg | Freq: Once | INTRAMUSCULAR | Status: AC
  Administered 2022-12-17: 40 mg via INTRAVENOUS
  Filled 2022-12-17: qty 4

## 2022-12-17 NOTE — Progress Notes (Signed)
14 Days Post-Op   Subjective/Chief Complaint:    Objective:   Reports incisional pain, worse with movement. Denies an increase or change in this discomfort over the weekend. Tolerating PO without nausea/emesis.   Ostomy 200 cc/24h documented Received 1 u pRBC 6/30 for hgb 6.6 >> 7.0 today, not an appropriate rise, however hgb has been consistently low 7.3-7.9. WBC 11.7 > 10.1 > 12.1 today, overall stable   Vital signs in last 24 hours: Temp:  [97.6 F (36.4 C)-98.8 F (37.1 C)] 97.8 F (36.6 C) (07/01 0431) Pulse Rate:  [62-73] 62 (07/01 0431) Resp:  [16-23] 17 (07/01 0431) BP: (126-154)/(56-100) 126/56 (07/01 0431) SpO2:  [92 %-100 %] 100 % (07/01 0431) Weight:  [99.8 kg] 99.8 kg (07/01 0544) Last BM Date : 12/16/22  Intake/Output from previous day: 06/30 0701 - 07/01 0700 In: 1936 [P.O.:1200; Blood:466; IV Piggyback:270] Out: 3060 [Urine:2850; Drains:10; Stool:200] Intake/Output this shift: No intake/output data recorded.  Gen; Alert, chronically ill appearing, NAD Pulm: normal work of breathing on Romeo Abd: soft, protuberant, midline invisions c/d/I with minimal fibrinous exudate central portion of wound.  Stoma in RUQ is pink, viable, edematous - there is thin liquid brown/bilious effluent in ostomy pouch, no blood.  MSK: edematous LLE with chronic skin chanes, R AKA.  Lab Results:  Recent Labs    12/16/22 0140 12/17/22 0148  WBC 10.1 12.1*  HGB 6.6* 7.0*  HCT 22.4* 24.1*  PLT 489* 492*   BMET Recent Labs    12/16/22 0140 12/17/22 0148  NA 137 135  K 5.0 4.5  CL 106 106  CO2 20* 20*  GLUCOSE 156* 131*  BUN 50* 45*  CREATININE 2.08* 2.12*  CALCIUM 8.1* 8.1*   PT/INR No results for input(s): "LABPROT", "INR" in the last 72 hours. ABG Recent Labs    12/14/22 1756  PHART 7.384  HCO3 21.7    Studies/Results: DG CHEST PORT 1 VIEW  Result Date: 12/16/2022 CLINICAL DATA:  Shortness of breath. EXAM: PORTABLE CHEST 1 VIEW COMPARISON:  12/13/2022  FINDINGS: Lordotic positioning noted. Heart size is within normal limits. Low lung volumes noted. Interstitial infiltrates show improvement since previous study, consistent with decreased pneumonitis or edema. No evidence of pulmonary consolidation or pleural effusion. Cervical spine fusion hardware noted. IMPRESSION: Low lung volumes noted. Improving interstitial infiltrates, consistent with decreased pneumonitis or edema. Electronically Signed   By: Danae Orleans M.D.   On: 12/16/2022 10:28    Anti-infectives: Anti-infectives (From admission, onward)    Start     Dose/Rate Route Frequency Ordered Stop   12/05/22 1800  vancomycin (VANCOREADY) IVPB 1250 mg/250 mL  Status:  Discontinued        1,250 mg 166.7 mL/hr over 90 Minutes Intravenous Every 48 hours 12/03/22 2346 12/08/22 1148   12/05/22 1300  meropenem (MERREM) 1 g in sodium chloride 0.9 % 100 mL IVPB        1 g 200 mL/hr over 30 Minutes Intravenous Every 12 hours 12/05/22 1201 12/10/22 0700   12/04/22 0100  piperacillin-tazobactam (ZOSYN) IVPB 3.375 g  Status:  Discontinued        3.375 g 12.5 mL/hr over 240 Minutes Intravenous Every 8 hours 12/03/22 2346 12/05/22 1159   12/03/22 1700  vancomycin (VANCOREADY) IVPB 2000 mg/400 mL        2,000 mg 200 mL/hr over 120 Minutes Intravenous  Once 12/03/22 1657 12/03/22 2020   12/03/22 1700  piperacillin-tazobactam (ZOSYN) IVPB 3.375 g        3.375  g 100 mL/hr over 30 Minutes Intravenous  Once 12/03/22 1657 12/03/22 1804       Assessment/Plan: POD #14 , 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 Sheliah Hatch 12/04/22 -NS WD dressing changes to midline wound BID -completed abx therapy for abd and for ESBL in urine -tol SOFT diet -JP drain with minimal seround fluid, will continue today and plan to remove tomorrow or later this week if output remains low. -WBC 12.1, overall stable, afebrile  midline wound looks good, con't WTD dressing changes -hgb 7.0.  may resume DVT  prophylaxis from surgical stnadpoint -scheduled Tylenol and Robaxin to help with non-narcotic pain control.   -WOC following for new ileostomy -imodium to TID, continue fiber, both seem to be slowing down ostomy output.  Monitor.     FEN - Soft VTE - SQH ID - vanc/Merrem, completed   Septic shock - resolved VDRF - per primary MMP - per primary  LOS: 14 days    Adam Phenix [name] 12/17/2022

## 2022-12-17 NOTE — TOC Progression Note (Signed)
Transition of Care [facility]) - Progression Note    Patient Details  Name: Gregory [name] MRN: [id number] Date of Birth: [birth year]  Transition of Care [facility]) CM/SW Contact  [name], [place] Phone Number: 12/17/2022, 3:35 PM  Clinical Narrative:     CSW met with patient at bedside. CSW introduced self and explained role. Patioent confirmed he is from [facility] and is agreeable to return once medically stable.   TOC will continue to follow and assist with discharge planning.  Antony Blackbird, MSW, LCSW Clinical Social Worker    Expected Discharge Plan: Skilled Nursing Facility Barriers to Discharge: Continued Medical Work up  Expected Discharge Plan and Services In-house Referral: Clinical Social Work     Living arrangements for the past 2 months: Skilled Nursing Facility                                       Social Determinants of Health (SDOH) Interventions SDOH Screenings   Food Insecurity: No Food Insecurity (09/18/2022)  Housing: Low Risk  (09/18/2022)  Transportation Needs: No Transportation Needs (09/18/2022)  Utilities: Not At Risk (09/18/2022)  Depression (PHQ2-9): Low Risk  (03/08/2021)  Tobacco Use: High Risk (12/04/2022)    Readmission Risk Interventions    09/06/2022    1:48 PM 08/27/2022   12:46 PM 05/03/2022   11:53 AM  Readmission Risk Prevention Plan  Transportation Screening Complete Complete Complete  Medication Review Oceanographer) Complete Complete Complete  PCP or Specialist appointment within 3-5 days of discharge Complete Complete Complete  HRI or Home Care Consult Complete Complete Complete  SW Recovery Care/Counseling Consult Complete Complete Complete  Palliative Care Screening Not Applicable Not Applicable Not Applicable  Skilled Nursing Facility Complete Complete Complete

## 2022-12-17 NOTE — Progress Notes (Signed)
PROGRESS NOTE    Gregory [name]  ZOX:[id number] DOB: Aug 18, 1960 DOA: 12/03/2022 PCP: Sherron Monday, MD   Brief Narrative:  Patient is a 62 year old obese Caucasian male with a past medical history significant for but not limited to COPD on home oxygen, diabetes mellitus type 2 status post right lower extremity AKA as well as other comorbidities who is a SNF resident at [facility] with frequent admissions to hospital.  On 12/03/2022 he presented to the ED with abdominal pain, urinary continence and hypoxia requiring 4 L nasal cannula.  His lactic acid level was 3 and he was given fluid bolus and repeat lactic acid was 4.  CT was concerning with an ileus with dilatation of the large intestine with stool balls in the distal colon.  General surgery was consulted given his rising lactic acid and concern for mesenteric ischemia.  The plan was to give an enema to clear stool burden with frequent serial examinations.  He was admitted to the critical care service given his elevated WBC is elevated at 28.7 he started on IV vancomycin and Zosyn.  On 12/04/2022 he had worsening lactic acidosis and physical examination and the decision was made to take the patient to the OR for exploration and patient was found to have ischemic colon and underwent a total abdominal colectomy and end ileostomy.  Postoperative setting he presented to ICU and was intubated and was hypotensive requiring multiple pressors including neo-, epi, Levophed and vasopressin along with a bicarbonate drip.  On 12/05/2022 his vasopressor requirements improved and he was extubated.  Current significant hospital events include the following:  Significant Hospital Events: Including procedures, antibiotic start and stop dates in addition to other pertinent events   6/17 admitted, s/p exploratory laparotomy for total colectomy due to ischemic colon 6/17 urine>> EColi>> 6/19 > extubated. A.Fib RVR - re intubated that night 6/22 developed A-fib RVR  overnight resulting in need for amiodarone drip and reinitiation of Levophed.   6/23 Required addition amiodarone bolus overnight, NSR on am exam  6/24 off pressors 6/25 ketamine stopped 6/27 Extubate 6/27 12/14/2018 for his potassium was increased and so case was discussed with nephrology and temporary measures were started on potassium now improving.  On 12/15/2022 he was transferred to the [facility] service.  Will obtain PT and OT to further evaluate and treat and try to get more mobilized.  12/16/22 overnight his blood count dropped and was typed and screened and transfused 1 unit PRBCs.  Will give him a dose of IV Lasix as well given that he sounds congested.  12/17/22: Count was on the low side again so we will type and screen and transfuse another unit PRBCs.  Continues to sound congested so we will give him another dose of IV Lasix.  Patient is to be up and moving.  General surgery considering removing the patient's JP drain either later today or tomorrow if the output remains low  Assessment and Plan:  Septic Shock with E Coli UTI and Ischemic Colitis  -Improved -Off of Pressors -WBC Trend: Recent Labs  Lab 12/11/22 0405 12/12/22 0234 12/13/22 0405 12/14/22 0115 12/15/22 0056 12/16/22 0140 12/17/22 0148  WBC 10.7* 11.7* 12.5* 12.2* 11.7* 10.1 12.1*  -Antibiotics completed on 12/09/22 -Continue to Monitor for S/Sx of Infection and Continue Pulmonary Toilet As below  Acute Hypoxic Respiratory Failure and Aspiration Pneumonitis, Improved COPD SpO2: 91 % O2 Flow Rate (L/min): 1 L/min FiO2 (%): 40 % -Continue arformoterol 15 mcg nebs twice daily, budesonide 0.5  mg nebs twice daily, and revefenacin 175 mcg nebs daily -Patient sounded congested 12/16/22 so repeat chest x-ray was done and showed "Lordotic positioning noted. Heart size is within normal limits. Low lung volumes noted. Interstitial infiltrates show improvement since previous study, consistent with decreased pneumonitis or  edema. No evidence of pulmonary consolidation or pleural effusion. Cervical spine fusion hardware noted." -Repeat CXR 12/17/22 shows "Persistent left retrocardiac patchy opacities and new right mid lung patchy opacity, likely atelectasis. Aspiration or pneumonia can be considered in the appropriate clinical setting." -Will give a dose of IV Lasix 40 mg x1 again given that he is getting another unit of blood -Continue with as needed albuterol as necessary -Continue with flutter valve, incentive spirometry and add guaifenesin 1200 mg p.o. twice daily -Repeat chest x-ray in the a.m.  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 -C/w post op care per Surgery, nutrition per surgery   Paroxysmal atrial fibrillation in setting of acute stress. HTN HLD PAD. -Had a Normal EF on Echo from 6/20 -Back in Sinus Rhythm -Continue Amiodarone 200 mg po Daily, Carvedilol 12.5 mg po BID, Hydralazine 10 mg po q8h, ASA 81 mg po Daily and Atorvastatin 80 mg po Daily -Patient also has IV hydralazine 10 mg every 6 hours as needed for systolic blood pressure greater than 160 or diastolic blood pressure below 161 -Continue to Monitor BP per Protocol  -Last BP reading was 105/62   AKI from ATN 2nd to sepsis on CKD Stage 3b. Hyperkalemia. Metabolic Acidosis -Baseline creatinine 1.60 from 09/21/22 -BUN/Cr Trend: Recent Labs  Lab 12/14/22 0115 12/14/22 1524 12/14/22 2148 12/15/22 0056 12/15/22 1038 12/16/22 0140 12/17/22 0148  BUN 54* 56* 56* 55* 52* 50* 45*  CREATININE 2.07* 2.04* 2.05* 2.05* 2.15* 2.08* 2.12*  -K+ Trend: Recent Labs  Lab 12/14/22 1524 12/14/22 1756 12/14/22 2148 12/15/22 0056 12/15/22 1038 12/16/22 0140 12/17/22 0148  K 7.0* 5.5* 5.6* 5.6* 5.3* 5.0 4.5  -Slight metabolic acidosis with a CO2 of 20, anion gap of 9, Chloride Level of 106 -Avoid Nephrotoxic Medications, Contrast Dyes, Hypotension and Dehydration to Ensure Adequate Renal Perfusion and will need to Renally  Adjust Meds -Given temporizing measures and was given calcium gluconate 1 g yesterday along with sodium bicarb and 50 medical ejection and is now on Lokelma 10 g 3 times daily for 6 doses and may continue if potassium continues to be elevated tomorrow -Continue to Monitor and Trend Renal Function carefully and repeat CMP in the AM  -Continue to Monitor  urine outpt with high ostomy outpt   Anemia of critical illness and iron deficiency and of Chronic Kidney Disease. -Hgb/Hct Trend: Recent Labs  Lab 12/13/22 0405 12/14/22 0115 12/14/22 1756 12/15/22 0056 12/16/22 0140 12/17/22 0148 12/17/22 1633  HGB 7.3* 7.3* 7.8* 8.6* 6.6* 7.0* 8.5*  HCT 25.2* 25.7* 23.0* 30.5* 22.4* 24.1* 27.3*  MCV 98.4 97.0  --  99.0 96.1 95.3  --   -Transfuse for Hb < 7; CBC showed low blood count this AM so was given another 1 unit of pRBC's to make 4 units total (Last transfused 2 units on 12/02/22 and on 12/16/22) -Given IV iron infusion on 6/27 -Checked Anemia Panel and showed an iron level of 24, UIBC 147, TIBC 171, saturation ratios of 14%, ferritin of 647, folate level 10.3, vitamin B12 2427 -Continue to Monitor for S/Sx of Bleeding; No overt bleeding noted -Repeat CBC in the AM   DM Type 2 poorly controlled with Hyperglycemia. -Continue with resistant NovoLog  sliding scale insulin before meals and at bedtime as well as Semglee 40 units twice daily -Glucose Level Trend: Recent Labs  Lab 12/14/22 0115 12/14/22 1524 12/14/22 2148 12/15/22 0056 12/15/22 1038 12/16/22 0140 12/17/22 0148  GLUCOSE 110* 130* 150* 135* 132* 156* 131*  -CBG Trend: Recent Labs  Lab 12/16/22 0606 12/16/22 1129 12/16/22 1724 12/16/22 2121 12/17/22 0540 12/17/22 1142 12/17/22 1615  GLUCAP 117* 137* 206* 171* 102* 194* 145*   BPH. -C/w Tamsulosin 0.4 mg Daily    Chronic neuropathic pain. -Dilaudid PCA d/c'eD -methadone started 6/25 >> titrate this up as needed to wean off  oxycodone; reduce oxycodone dose given that  he is getting 20 mg every 4 hours not milligrams every 6 as needed as well; now on 10 mg every 6 hours as needed and will continue to Monitor as he appeared a little sleepy again -Continue lexapro, neurontin, lidoderm patch, robaxin, seroquel  Pressure Injuries, poA Pressure Injury 09/05/22 Ankle Anterior;Left Stage 2 -  Partial thickness loss of dermis presenting as a shallow open injury with a red, pink wound bed without slough. full thickness wound, NOT a pressure injury (Active)  09/05/22 2250  Location: Ankle  Location Orientation: Anterior;Left  Staging: Stage 2 -  Partial thickness loss of dermis presenting as a shallow open injury with a red, pink wound bed without slough.  Wound Description (Comments): full thickness wound, NOT a pressure injury  Present on Admission: Yes     Pressure Injury 12/04/22 Buttocks Posterior;Proximal;Right Stage 2 -  Partial thickness loss of dermis presenting as a shallow open injury with a red, pink wound bed without slough. (Active)  12/04/22 0410  Location: Buttocks  Location Orientation: Posterior;Proximal;Right  Staging: Stage 2 -  Partial thickness loss of dermis presenting as a shallow open injury with a red, pink wound bed without slough.  Wound Description (Comments):   Present on Admission: Yes     Pressure Injury 12/04/22 Thigh Left;Posterior Stage 3 -  Full thickness tissue loss. Subcutaneous fat may be visible but bone, tendon or muscle are NOT exposed. (Active)  12/04/22 0410  Location: Thigh  Location Orientation: Left;Posterior  Staging: Stage 3 -  Full thickness tissue loss. Subcutaneous fat may be visible but bone, tendon or muscle are NOT exposed.  Wound Description (Comments):   Present on Admission: Yes   Hypoalbuminemia -Patient's Albumin Trend: Recent Labs  Lab 12/03/22 1654 12/04/22 0941 12/06/22 0900 12/07/22 1200 12/15/22 1038 12/16/22 0140 12/17/22 0148  ALBUMIN 3.5 2.1* <1.5* 2.3* 1.9* 1.6* 1.5*  -Continue to  Monitor and Trend and repeat CMP in the AM  Obesity -Complicates overall prognosis and care -Estimated body mass index is 29.84 kg/m as calculated from the following:   Height as of this encounter: 6' (1.829 m).   Weight as of this encounter: 99.8 kg.  -Weight Loss and Dietary Counseling given   DVT prophylaxis: heparin injection 5,000 Units Start: 12/08/22 1400    Code Status: Full Code Family Communication: No family present at bedside  Disposition Plan:  Level of care: Progressive Status is: Inpatient Remains inpatient appropriate because: Further clinical improvement and clearance by specialists   Consultants:  PCCM transfer General Surgery  Procedures:  As delineated as above  Antimicrobials:  Anti-infectives (From admission, onward)    Start     Dose/Rate Route Frequency Ordered Stop   12/05/22 1800  vancomycin (VANCOREADY) IVPB 1250 mg/250 mL  Status:  Discontinued        1,250 mg 166.7 mL/hr over  90 Minutes Intravenous Every 48 hours 12/03/22 2346 12/08/22 1148   12/05/22 1300  meropenem (MERREM) 1 g in sodium chloride 0.9 % 100 mL IVPB        1 g 200 mL/hr over 30 Minutes Intravenous Every 12 hours 12/05/22 1201 12/10/22 0700   12/04/22 0100  piperacillin-tazobactam (ZOSYN) IVPB 3.375 g  Status:  Discontinued        3.375 g 12.5 mL/hr over 240 Minutes Intravenous Every 8 hours 12/03/22 2346 12/05/22 1159   12/03/22 1700  vancomycin (VANCOREADY) IVPB 2000 mg/400 mL        2,000 mg 200 mL/hr over 120 Minutes Intravenous  Once 12/03/22 1657 12/03/22 2020   12/03/22 1700  piperacillin-tazobactam (ZOSYN) IVPB 3.375 g        3.375 g 100 mL/hr over 30 Minutes Intravenous  Once 12/03/22 1657 12/03/22 1804       Subjective: Seen and examined at bedside patient is doing okay and he is a little sleepy.  Still having some abdominal discomfort.  No chest pain, lightheadedness or dizziness.  Objective: Vitals:   12/17/22 1139 12/17/22 1320 12/17/22 1530 12/17/22 1625   BP: (!) 126/52 (!) 114/94  105/62  Pulse: 62 68  (!) 58  Resp: 18 20    Temp: 98.1 F (36.7 C) 98.8 F (37.1 C) 98.4 F (36.9 C)   TempSrc: Oral Oral Oral   SpO2: 98% 91%    Weight:      Height:        Intake/Output Summary (Last 24 hours) at 12/17/2022 1800 Last data filed at 12/17/2022 1538 Gross per 24 hour  Intake 917.67 ml  Output 2360 ml  Net -1442.33 ml   Filed Weights   12/15/22 0500 12/16/22 0429 12/17/22 0544  Weight: 100.9 kg 99.3 kg 99.8 kg   Examination: Physical Exam:  Constitutional: WN/WD overweight chronically appearing Caucasian male who is resting Respiratory: Diminished to auscultation bilaterally with some coarse breath sounds and has some rhonch with no appreciable crackles, wheezing or rales. Normal respiratory effort and patient is not tachypenic. No accessory muscle use.  Wearing supplemental oxygen via nasal cannula Cardiovascular: RRR, no murmurs / rubs / gallops. S1 and S2 auscultated.  Has a right AKA and has some lower extremity edema on the left Abdomen: Soft, tender to palpation and is distended he has a colostomy, midline abdominal incision and a JP drain in place.  Bowel sounds present. Bowel sounds positive.  GU: Deferred. Musculoskeletal: Has a right AKA Skin: No rashes, lesions, ulcers on limited skin evaluation but his left leg is appearing leathery from the chronic venous stasis.. No induration; Warm and dry.  Neurologic: CN 2-12 grossly intact with no focal deficits. Romberg sign and cerebellar reflexes not assessed.  Psychiatric: Normal judgment and insight. Alert and oriented x 3. Normal mood and appropriate affect.   Data Reviewed: I have personally reviewed following labs and imaging studies  CBC: Recent Labs  Lab 12/13/22 0405 12/14/22 0115 12/14/22 1756 12/15/22 0056 12/16/22 0140 12/17/22 0148 12/17/22 1633  WBC 12.5* 12.2*  --  11.7* 10.1 12.1*  --   NEUTROABS  --   --   --   --  7.5 9.0*  --   HGB 7.3* 7.3* 7.8* 8.6*  6.6* 7.0* 8.5*  HCT 25.2* 25.7* 23.0* 30.5* 22.4* 24.1* 27.3*  MCV 98.4 97.0  --  99.0 96.1 95.3  --   PLT 366 391  --  452* 489* 492*  --    Basic  Metabolic Panel: Recent Labs  Lab 12/12/22 0234 12/13/22 0405 12/14/22 0115 12/14/22 2148 12/15/22 0056 12/15/22 1038 12/16/22 0140 12/17/22 0148  NA 147* 143   < > 140 136 137 137 135  K 4.6 4.8   < > 5.6* 5.6* 5.3* 5.0 4.5  CL 111 112*   < > 104 105 107 106 106  CO2 24 24   < > 22 22 20* 20* 20*  GLUCOSE 222* 194*   < > 150* 135* 132* 156* 131*  BUN 59* 60*   < > 56* 55* 52* 50* 45*  CREATININE 2.39* 2.27*   < > 2.05* 2.05* 2.15* 2.08* 2.12*  CALCIUM 8.2* 7.9*   < > 8.5* 8.5* 8.5* 8.1* 8.1*  MG 2.1 2.0  --   --   --  1.8 1.6* 1.5*  PHOS 5.1* 4.3  --   --   --  6.1* 5.6* 5.7*   < > = values in this interval not displayed.   GFR: Estimated Creatinine Clearance: 43.6 mL/min (A) (by C-G formula based on SCr of 2.12 mg/dL (H)). Liver Function Tests: Recent Labs  Lab 12/15/22 1038 12/16/22 0140 12/17/22 0148  AST 26 20 17   ALT 29 25 21   ALKPHOS 171* 151* 141*  BILITOT 0.7 0.6 0.7  PROT 7.1 6.2* 5.9*  ALBUMIN 1.9* 1.6* 1.5*   No results for input(s): "LIPASE", "AMYLASE" in the last 168 hours. No results for input(s): "AMMONIA" in the last 168 hours. Coagulation Profile: No results for input(s): "INR", "PROTIME" in the last 168 hours. Cardiac Enzymes: No results for input(s): "CKTOTAL", "CKMB", "CKMBINDEX", "TROPONINI" in the last 168 hours. BNP (last 3 results) No results for input(s): "PROBNP" in the last 8760 hours. HbA1C: No results for input(s): "HGBA1C" in the last 72 hours. CBG: Recent Labs  Lab 12/16/22 1724 12/16/22 2121 12/17/22 0540 12/17/22 1142 12/17/22 1615  GLUCAP 206* 171* 102* 194* 145*   Lipid Profile: No results for input(s): "CHOL", "HDL", "LDLCALC", "TRIG", "CHOLHDL", "LDLDIRECT" in the last 72 hours. Thyroid Function Tests: No results for input(s): "TSH", "T4TOTAL", "FREET4", "T3FREE",  "THYROIDAB" in the last 72 hours. Anemia Panel: Recent Labs    12/16/22 0140  VITAMINB12 2,427*  FOLATE 10.3  FERRITIN 647*  TIBC 171*  IRON 24*  RETICCTPCT 1.2   Sepsis Labs: No results for input(s): "PROCALCITON", "LATICACIDVEN" in the last 168 hours.  Recent Results (from the past 240 hour(s))  Culture, blood (Routine X 2) w Reflex to ID Panel     Status: None (Preliminary result)   Collection Time: 12/13/22 11:23 AM   Specimen: BLOOD LEFT HAND  Result Value Ref Range Status   Specimen Description BLOOD LEFT HAND  Final   Special Requests   Final    BOTTLES DRAWN AEROBIC AND ANAEROBIC Blood Culture adequate volume   Culture   Final    NO GROWTH 4 DAYS Performed at [facility], [address], [place], [place] [address]    Report Status PENDING  Incomplete  Culture, blood (Routine X 2) w Reflex to ID Panel     Status: None (Preliminary result)   Collection Time: 12/13/22 11:23 AM   Specimen: BLOOD RIGHT HAND  Result Value Ref Range Status   Specimen Description BLOOD RIGHT HAND  Final   Special Requests   Final    BOTTLES DRAWN AEROBIC AND ANAEROBIC Blood Culture adequate volume   Culture   Final    NO GROWTH 4 DAYS Performed at [facility], [address], [place], [place] [address]    Report Status PENDING  Incomplete    Radiology Studies: DG CHEST PORT 1 VIEW  Result Date: 12/17/2022 CLINICAL DATA:  Sepsis and shortness of breath EXAM: PORTABLE CHEST 1 VIEW COMPARISON:  Chest radiograph dated 12/16/2022 FINDINGS: Lines/tubes: None. Lungs: Persistent left retrocardiac patchy opacities. New right mid lung patchy opacity. Pleura: No pneumothorax or pleural effusion. Heart/mediastinum: The heart size and mediastinal contours are within normal limits. Bones: Cervical spinal fixation hardware appears intact. IMPRESSION: Persistent left retrocardiac patchy opacities and new right mid lung patchy opacity, likely atelectasis. Aspiration or pneumonia can be  considered in the appropriate clinical setting. Electronically Signed   By: Agustin Cree M.D.   On: 12/17/2022 09:04   DG CHEST PORT 1 VIEW  Result Date: 12/16/2022 CLINICAL DATA:  Shortness of breath. EXAM: PORTABLE CHEST 1 VIEW COMPARISON:  12/13/2022 FINDINGS: Lordotic positioning noted. Heart size is within normal limits. Low lung volumes noted. Interstitial infiltrates show improvement since previous study, consistent with decreased pneumonitis or edema. No evidence of pulmonary consolidation or pleural effusion. Cervical spine fusion hardware noted. IMPRESSION: Low lung volumes noted. Improving interstitial infiltrates, consistent with decreased pneumonitis or edema. Electronically Signed   By: Danae Orleans M.D.   On: 12/16/2022 10:28    Scheduled Meds:  acetaminophen  650 mg Oral Q6H   amiodarone  200 mg Oral Daily   arformoterol  15 mcg Nebulization BID   aspirin EC  81 mg Oral Daily   atorvastatin  80 mg Oral Daily   budesonide (PULMICORT) nebulizer solution  0.5 mg Nebulization BID   carvedilol  12.5 mg Oral BID WC   Chlorhexidine Gluconate Cloth  6 each Topical Daily   escitalopram  20 mg Oral Daily   feeding supplement  237 mL Oral BID BM   furosemide  40 mg Intravenous Once   gabapentin  200 mg Oral Q8H   guaiFENesin  1,200 mg Oral BID   heparin injection (subcutaneous)  5,000 Units Subcutaneous Q8H   hydrALAZINE  10 mg Oral Q8H   insulin aspart  0-20 Units Subcutaneous TID WC   insulin aspart  0-5 Units Subcutaneous QHS   insulin glargine-yfgn  4 Units Subcutaneous BID   leptospermum manuka honey  1 Application Topical Daily   lidocaine  2 patch Transdermal Daily   loperamide  2 mg Oral QID   methadone  10 mg Oral Q8H   methocarbamol  1,000 mg Oral Q8H   multivitamin with minerals  1 tablet Oral Daily   nutrition supplement (JUVEN)  1 packet Oral BID BM   mouth rinse  15 mL Mouth Rinse TID AC & HS   pantoprazole  40 mg Oral QHS   QUEtiapine  100 mg Oral QHS   revefenacin   175 mcg Nebulization Daily   tamsulosin  0.4 mg Oral Daily   Continuous Infusions:  sodium chloride Stopped (12/14/22 1901)    LOS: 14 days   Marguerita Merles, DO [facility] Available via Epic secure chat 7am-7pm After these hours, please refer to coverage provider listed on [email or web address] 12/17/2022, 6:00 PM

## 2022-12-17 NOTE — Consult Note (Addendum)
WOC Nurse ostomy follow up Stoma type/location: RMQ loop ileostomy Stomal assessment/size: 1 3/4" pink and moist  photo in chart  scant stool and mucus in pouch with change today.   Due to prolapse, will not use convexity as indicated at last pouch change.  No convex barriers were in the room.  (Supplies ordered last week were not delivered)  Peristomal assessment: erythema, intact, light dusting of antifungal powder and sealed with skin prep as there may be satellite lesions present, consistent with fungal overgrowth.  Treatment options for stomal/peristomal skin:  powder and skin prep, flat 2 piece pouch system.  Output scant mucus and brown effluent Ostomy pouching: 2pc.  Education provided: none  patient refused initial offerings for pouch change due to pain. Agreed to later time. Arrived at agreed upon time and patient in bed with eyes closed.  POuch change started, patient opens eyes and says " I need pain medicine"  I remind him he just received analgesia and he closes his eyes and returns to sleep. Sleeps through pouch change.   Last week, was evaluated to apply pouch to SP tube site.  I see JP drain in LLQ with serous effluent in bulb, likely due to ascites.  Skin in abdominal fold near groin is dry and there is no opening present there, beneath pannus or on mons (although an external male urinary pouch is in place over penis)  JP drain is not leaking around tube (sutured in place) and dry dressing is in place.  I call the bedside RN in to room to confirm there is no other area leaking and she does not know of another orifice that may need pouching.  There are urine and fecal pouches in the room if needed.  Enrolled patient in [facility] Discharge program: No Will follow.  Mike Gip MSN, RN, FNP-BC CWON Wound, Ostomy, Continence Nurse Outpatient [facility] [phone] Pager [phone]

## 2022-12-18 ENCOUNTER — Inpatient Hospital Stay (HOSPITAL_COMMUNITY): Payer: Medicaid Other

## 2022-12-18 DIAGNOSIS — D72829 Elevated white blood cell count, unspecified: Secondary | ICD-10-CM | POA: Diagnosis not present

## 2022-12-18 DIAGNOSIS — A419 Sepsis, unspecified organism: Secondary | ICD-10-CM | POA: Diagnosis not present

## 2022-12-18 DIAGNOSIS — N170 Acute kidney failure with tubular necrosis: Secondary | ICD-10-CM | POA: Diagnosis not present

## 2022-12-18 DIAGNOSIS — K559 Vascular disorder of intestine, unspecified: Secondary | ICD-10-CM | POA: Diagnosis not present

## 2022-12-18 LAB — CBC WITH DIFFERENTIAL/PLATELET
Abs Immature Granulocytes: 0.15 10*3/uL — ABNORMAL HIGH (ref 0.00–0.07)
Basophils Absolute: 0.1 10*3/uL (ref 0.0–0.1)
Basophils Relative: 1 %
Eosinophils Absolute: 0.2 10*3/uL (ref 0.0–0.5)
Eosinophils Relative: 2 %
HCT: 32.9 % — ABNORMAL LOW (ref 39.0–52.0)
Hemoglobin: 10.2 g/dL — ABNORMAL LOW (ref 13.0–17.0)
Immature Granulocytes: 2 %
Lymphocytes Relative: 19 %
Lymphs Abs: 1.5 10*3/uL (ref 0.7–4.0)
MCH: 29.2 pg (ref 26.0–34.0)
MCHC: 31 g/dL (ref 30.0–36.0)
MCV: 94.3 fL (ref 80.0–100.0)
Monocytes Absolute: 1 10*3/uL (ref 0.1–1.0)
Monocytes Relative: 12 %
Neutro Abs: 5.2 10*3/uL (ref 1.7–7.7)
Neutrophils Relative %: 64 %
Platelets: 259 10*3/uL (ref 150–400)
RBC: 3.49 MIL/uL — ABNORMAL LOW (ref 4.22–5.81)
RDW: 15.3 % (ref 11.5–15.5)
WBC: 8 10*3/uL (ref 4.0–10.5)
nRBC: 0 % (ref 0.0–0.2)

## 2022-12-18 LAB — COMPREHENSIVE METABOLIC PANEL
ALT: 25 U/L (ref 0–44)
AST: 23 U/L (ref 15–41)
Albumin: 1.7 g/dL — ABNORMAL LOW (ref 3.5–5.0)
Alkaline Phosphatase: 163 U/L — ABNORMAL HIGH (ref 38–126)
Anion gap: 13 (ref 5–15)
BUN: 42 mg/dL — ABNORMAL HIGH (ref 8–23)
CO2: 17 mmol/L — ABNORMAL LOW (ref 22–32)
Calcium: 8.4 mg/dL — ABNORMAL LOW (ref 8.9–10.3)
Chloride: 104 mmol/L (ref 98–111)
Creatinine, Ser: 2.02 mg/dL — ABNORMAL HIGH (ref 0.61–1.24)
GFR, Estimated: 36 mL/min — ABNORMAL LOW (ref >60)
Glucose, Bld: 149 mg/dL — ABNORMAL HIGH (ref 70–99)
Potassium: 5.2 mmol/L — ABNORMAL HIGH (ref 3.5–5.1)
Sodium: 134 mmol/L — ABNORMAL LOW (ref 135–145)
Total Bilirubin: 0.4 mg/dL (ref 0.3–1.2)
Total Protein: 6.3 g/dL — ABNORMAL LOW (ref 6.5–8.1)

## 2022-12-18 LAB — TYPE AND SCREEN
ABO/RH(D): O POS
Unit division: 0

## 2022-12-18 LAB — BPAM RBC
Blood Product Expiration Date: 202407252359
Unit Type and Rh: 5100

## 2022-12-18 LAB — GLUCOSE, CAPILLARY
Glucose-Capillary: 133 mg/dL — ABNORMAL HIGH (ref 70–99)
Glucose-Capillary: 112 mg/dL — ABNORMAL HIGH (ref 70–99)
Glucose-Capillary: 133 mg/dL — ABNORMAL HIGH (ref 70–99)
Glucose-Capillary: 193 mg/dL — ABNORMAL HIGH (ref 70–99)

## 2022-12-18 LAB — CULTURE, BLOOD (ROUTINE X 2): Special Requests: ADEQUATE

## 2022-12-18 LAB — PHOSPHORUS: Phosphorus: 5.5 mg/dL — ABNORMAL HIGH (ref 2.5–4.6)

## 2022-12-18 LAB — MAGNESIUM: Magnesium: 1.7 mg/dL (ref 1.7–2.4)

## 2022-12-18 MED ORDER — SODIUM ZIRCONIUM CYCLOSILICATE 10 G PO PACK
10.0000 g | PACK | Freq: Four times a day (QID) | ORAL | Status: AC
  Administered 2022-12-18: 10 g via ORAL
  Filled 2022-12-18 (×2): qty 1

## 2022-12-18 MED ORDER — SODIUM BICARBONATE 650 MG PO TABS
650.0000 mg | ORAL_TABLET | Freq: Two times a day (BID) | ORAL | Status: DC
  Administered 2022-12-18 – 2022-12-25 (×15): 650 mg via ORAL
  Filled 2022-12-18 (×15): qty 1

## 2022-12-18 MED ORDER — MAGNESIUM SULFATE 2 GM/50ML IV SOLN
2.0000 g | Freq: Once | INTRAVENOUS | Status: AC
  Administered 2022-12-18: 2 g via INTRAVENOUS
  Filled 2022-12-18: qty 50

## 2022-12-18 MED ORDER — FUROSEMIDE 10 MG/ML IJ SOLN
40.0000 mg | Freq: Once | INTRAMUSCULAR | Status: AC
  Administered 2022-12-18: 40 mg via INTRAVENOUS
  Filled 2022-12-18: qty 4

## 2022-12-18 NOTE — TOC Progression Note (Addendum)
Transition of Care [facility]) - Progression Note    Patient Details  Name: Gregory [name] MRN: [id number] Date of Birth: 1960-10-29  Transition of Care [facility]) CM/SW Contact  [name], [place] Phone Number: 12/18/2022, 10:20 AM  Clinical Narrative:     11:15am- spoke with patient's daughter- answered all questions regarding SNF and who she can talk with at SNF regarding continued care for the patient at SNF.  10:20am-Returned call to patient's daughter as requested- received no answer   Antony Blackbird, MSW, LCSW Clinical Social Worker    Expected Discharge Plan: Skilled Nursing Facility Barriers to Discharge: Continued Medical Work up  Expected Discharge Plan and Services In-house Referral: Clinical Social Work     Living arrangements for the past 2 months: Skilled Nursing Facility                                       Social Determinants of Health (SDOH) Interventions SDOH Screenings   Food Insecurity: No Food Insecurity (09/18/2022)  Housing: Low Risk  (09/18/2022)  Transportation Needs: No Transportation Needs (09/18/2022)  Utilities: Not At Risk (09/18/2022)  Depression (PHQ2-9): Low Risk  (03/08/2021)  Tobacco Use: High Risk (12/04/2022)    Readmission Risk Interventions    09/06/2022    1:48 PM 08/27/2022   12:46 PM 05/03/2022   11:53 AM  Readmission Risk Prevention Plan  Transportation Screening Complete Complete Complete  Medication Review Oceanographer) Complete Complete Complete  PCP or Specialist appointment within 3-5 days of discharge Complete Complete Complete  HRI or Home Care Consult Complete Complete Complete  SW Recovery Care/Counseling Consult Complete Complete Complete  Palliative Care Screening Not Applicable Not Applicable Not Applicable  Skilled Nursing Facility Complete Complete Complete

## 2022-12-18 NOTE — Progress Notes (Signed)
PT Cancellation Note  Patient Details Name: Gregory [name] MRN: [id number] DOB: [birth year]   Cancelled Treatment:    Reason Eval/Treat Not Completed: (P) Pain limiting ability to participate (Pt reports abdominal pain and refuses all mobility. Will continue to follow per PT POC.)   [name] 12/18/2022, 2:14 PM

## 2022-12-18 NOTE — Progress Notes (Signed)
PROGRESS NOTE    Gregory [name]  MVH:[id number] DOB: Nov 09, 1960 DOA: 12/03/2022 PCP: Sherron Monday, MD   Brief Narrative:  Patient is a 62 year old obese Caucasian male with a past medical history significant for but not limited to COPD on home oxygen, diabetes mellitus type 2 status post right lower extremity AKA as well as other comorbidities who is a SNF resident at [facility] with frequent admissions to hospital.  On 12/03/2022 he presented to the ED with abdominal pain, urinary continence and hypoxia requiring 4 L nasal cannula.  His lactic acid level was 3 and he was given fluid bolus and repeat lactic acid was 4.  CT was concerning with an ileus with dilatation of the large intestine with stool balls in the distal colon.  General surgery was consulted given his rising lactic acid and concern for mesenteric ischemia.  The plan was to give an enema to clear stool burden with frequent serial examinations.  He was admitted to the critical care service given his elevated WBC is elevated at 28.7 he started on IV vancomycin and Zosyn.  On 12/04/2022 he had worsening lactic acidosis and physical examination and the decision was made to take the patient to the OR for exploration and patient was found to have ischemic colon and underwent a total abdominal colectomy and end ileostomy.  Postoperative setting he presented to ICU and was intubated and was hypotensive requiring multiple pressors including neo-, epi, Levophed and vasopressin along with a bicarbonate drip.  On 12/05/2022 his vasopressor requirements improved and he was extubated.  Current significant hospital events include the following:  Significant Hospital Events: Including procedures, antibiotic start and stop dates in addition to other pertinent events   6/17 admitted, s/p exploratory laparotomy for total colectomy due to ischemic colon 6/17 urine>> EColi>> 6/19 > extubated. A.Fib RVR - re intubated that night 6/22 developed A-fib RVR  overnight resulting in need for amiodarone drip and reinitiation of Levophed.   6/23 Required addition amiodarone bolus overnight, NSR on am exam  6/24 off pressors 6/25 ketamine stopped 6/27 Extubate 6/27 12/14/2018 for his potassium was increased and so case was discussed with nephrology and temporary measures were started on potassium now improving.  On 12/15/2022 he was transferred to the [facility] service.  Will obtain PT and OT to further evaluate and treat and try to get more mobilized.  12/16/22 overnight his blood count dropped and was typed and screened and transfused 1 unit PRBCs.  Will give him a dose of IV Lasix as well given that he sounds congested.  12/17/22: Count was on the low side again so we will type and screen and transfuse another unit PRBCs.  Continues to sound congested so we will give him another dose of IV Lasix.  Patient is to be up and moving.  General surgery considering removing the patient's JP drain either later today or tomorrow if the output remains low  12/18/22: Blood count is improved and he is doing okay and not as short of breath.  On 1 L of oxygen will need to continue to try and wean.  Will need to continue monitor his output and there is slowing down.  Assessment and Plan:  Septic Shock with E Coli UTI and Ischemic Colitis  -Improved -Off of Pressors -WBC Trend: Recent Labs  Lab 12/12/22 0234 12/13/22 0405 12/14/22 0115 12/15/22 0056 12/16/22 0140 12/17/22 0148 12/18/22 0135  WBC 11.7* 12.5* 12.2* 11.7* 10.1 12.1* 8.0  -Antibiotics completed on 12/09/22 -Continue  to Monitor for S/Sx of Infection and Continue Pulmonary Toilet As below  Acute Hypoxic Respiratory Failure and Aspiration Pneumonitis, Improved COPD SpO2: 95 % O2 Flow Rate (L/min): 1 L/min FiO2 (%): 40 % -Continue arformoterol 15 mcg nebs twice daily, budesonide 0.5 mg nebs twice daily, and revefenacin 175 mcg nebs daily -Patient sounded congested 12/16/22 so repeat chest x-ray was done  and showed "Lordotic positioning noted. Heart size is within normal limits. Low lung volumes noted. Interstitial infiltrates show improvement since previous study, consistent with decreased pneumonitis or edema. No evidence of pulmonary consolidation or pleural effusion. Cervical spine fusion hardware noted." -Repeat CXR 12/18/22 shows "Patchy opacities in both suprahilar regions, improved on the right. New or worsening focal airspace disease." -Received IV Lasix the last 2 days and was given another dose today 40 mg of 1 -Continue with as needed albuterol as necessary -Continue with flutter valve, incentive spirometry and add guaifenesin 1200 mg p.o. twice daily -Repeat chest x-ray in the a.m.  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 -C/w post op care per Surgery, nutrition per surgery   Paroxysmal atrial fibrillation in setting of acute stress. HTN HLD PAD. -Had a Normal EF on Echo from 6/20 -Back in Sinus Rhythm -Continue Amiodarone 200 mg po Daily, Carvedilol 12.5 mg po BID, Hydralazine 10 mg po q8h, ASA 81 mg po Daily and Atorvastatin 80 mg po Daily -Patient also has IV hydralazine 10 mg every 6 hours as needed for systolic blood pressure greater than 160 or diastolic blood pressure below 782 -Continue to Monitor BP per Protocol  -Last BP reading was 110/65   AKI from ATN 2nd to sepsis on CKD Stage 3b. Hyperkalemia. Metabolic Acidosis -Baseline creatinine 1.60 from 09/21/22 -BUN/Cr Trend: Recent Labs  Lab 12/14/22 1524 12/14/22 2148 12/15/22 0056 12/15/22 1038 12/16/22 0140 12/17/22 0148 12/18/22 0135  BUN 56* 56* 55* 52* 50* 45* 42*  CREATININE 2.04* 2.05* 2.05* 2.15* 2.08* 2.12* 2.02*  -K+ Trend: Recent Labs  Lab 12/14/22 1756 12/14/22 2148 12/15/22 0056 12/15/22 1038 12/16/22 0140 12/17/22 0148 12/18/22 0135  K 5.5* 5.6* 5.6* 5.3* 5.0 4.5 5.2*  -Slight metabolic acidosis with a CO2 of 20, anion gap of 9, Chloride Level of 106 -Avoid Nephrotoxic  Medications, Contrast Dyes, Hypotension and Dehydration to Ensure Adequate Renal Perfusion and will need to Renally Adjust Meds -Continue Lokelma 10 gram BID -Continue to Monitor and Trend Renal Function carefully and repeat CMP in the AM  -Continue to Monitor  urine outpt with high ostomy outpt and Output is slowing down and per Gen Surg   Anemia of critical illness and iron deficiency and of Chronic Kidney Disease. -Hgb/Hct Trend: Recent Labs  Lab 12/14/22 0115 12/14/22 1756 12/15/22 0056 12/16/22 0140 12/17/22 0148 12/17/22 1633 12/18/22 0135  HGB 7.3* 7.8* 8.6* 6.6* 7.0* 8.5* 10.2*  HCT 25.7* 23.0* 30.5* 22.4* 24.1* 27.3* 32.9*  MCV 97.0  --  99.0 96.1 95.3  --  94.3  -Transfuse for Hb < 7; CBC showing improvement -Transfused pRBC's to make 4 units total (transfused 2 units on 12/02/22 and on 12/16/22 and on 12/17/22) -Given IV iron infusion on 6/27 -Checked Anemia Panel and showed an iron level of 24, UIBC 147, TIBC 171, saturation ratios of 14%, ferritin of 647, folate level 10.3, vitamin B12 2427 -Continue to Monitor for S/Sx of Bleeding; No overt bleeding noted -Repeat CBC in the AM   DM Type 2 poorly controlled with Hyperglycemia. -Continue with resistant NovoLog sliding scale insulin before  meals and at bedtime as well as Semglee 40 units twice daily -Glucose Level Trend: Recent Labs  Lab 12/14/22 1524 12/14/22 2148 12/15/22 0056 12/15/22 1038 12/16/22 0140 12/17/22 0148 12/18/22 0135  GLUCOSE 130* 150* 135* 132* 156* 131* 149*  -CBG Trend: Recent Labs  Lab 12/17/22 0540 12/17/22 1142 12/17/22 1615 12/17/22 2125 12/18/22 0607 12/18/22 1122 12/18/22 1635  GLUCAP 102* 194* 145* 127* 133* 112* 133*   BPH. -C/w Tamsulosin 0.4 mg Daily    Chronic neuropathic pain. -Dilaudid PCA d/c'eD -methadone started 6/25 >> titrate this up as needed to wean off  oxycodone; reduce oxycodone dose given that he is getting 20 mg every 4 hours not milligrams every 6 as needed  as well; now on 10 mg every 6 hours as needed and will continue to Monitor as he appeared a little sleepy again -Continue lexapro, neurontin, lidoderm patch, robaxin, seroquel  Pressure Injuries, poA Pressure Injury 09/05/22 Ankle Anterior;Left Stage 2 -  Partial thickness loss of dermis presenting as a shallow open injury with a red, pink wound bed without slough. full thickness wound, NOT a pressure injury (Active)  09/05/22 2250  Location: Ankle  Location Orientation: Anterior;Left  Staging: Stage 2 -  Partial thickness loss of dermis presenting as a shallow open injury with a red, pink wound bed without slough.  Wound Description (Comments): full thickness wound, NOT a pressure injury  Present on Admission: Yes     Pressure Injury 12/04/22 Buttocks Posterior;Proximal;Right Stage 2 -  Partial thickness loss of dermis presenting as a shallow open injury with a red, pink wound bed without slough. (Active)  12/04/22 0410  Location: Buttocks  Location Orientation: Posterior;Proximal;Right  Staging: Stage 2 -  Partial thickness loss of dermis presenting as a shallow open injury with a red, pink wound bed without slough.  Wound Description (Comments):   Present on Admission: Yes     Pressure Injury 12/04/22 Thigh Left;Posterior Stage 3 -  Full thickness tissue loss. Subcutaneous fat may be visible but bone, tendon or muscle are NOT exposed. (Active)  12/04/22 0410  Location: Thigh  Location Orientation: Left;Posterior  Staging: Stage 3 -  Full thickness tissue loss. Subcutaneous fat may be visible but bone, tendon or muscle are NOT exposed.  Wound Description (Comments):   Present on Admission: Yes   Hypoalbuminemia -Patient's Albumin Trend: Recent Labs  Lab 12/04/22 0941 12/06/22 0900 12/07/22 1200 12/15/22 1038 12/16/22 0140 12/17/22 0148 12/18/22 0135  ALBUMIN 2.1* <1.5* 2.3* 1.9* 1.6* 1.5* 1.7*  -Continue to Monitor and Trend and repeat CMP in the AM  Obesity -Complicates  overall prognosis and care -Estimated body mass index is 29.84 kg/m as calculated from the following:   Height as of this encounter: 6' (1.829 m).   Weight as of this encounter: 99.8 kg.  -Weight Loss and Dietary Counseling given   DVT prophylaxis: heparin injection 5,000 Units Start: 12/08/22 1400    Code Status: Full Code Family Communication: No family present at bedside  Disposition Plan:  Level of care: Progressive Status is: Inpatient Remains inpatient appropriate because: Needs further clinical improvement to go back to SNF and clearance by general surgery   Consultants:  PCCM transfer General surgery  Procedures:  As delineated as above  Antimicrobials:  Anti-infectives (From admission, onward)    Start     Dose/Rate Route Frequency Ordered Stop   12/05/22 1800  vancomycin (VANCOREADY) IVPB 1250 mg/250 mL  Status:  Discontinued        1,250 mg  166.7 mL/hr over 90 Minutes Intravenous Every 48 hours 12/03/22 2346 12/08/22 1148   12/05/22 1300  meropenem (MERREM) 1 g in sodium chloride 0.9 % 100 mL IVPB        1 g 200 mL/hr over 30 Minutes Intravenous Every 12 hours 12/05/22 1201 12/10/22 0700   12/04/22 0100  piperacillin-tazobactam (ZOSYN) IVPB 3.375 g  Status:  Discontinued        3.375 g 12.5 mL/hr over 240 Minutes Intravenous Every 8 hours 12/03/22 2346 12/05/22 1159   12/03/22 1700  vancomycin (VANCOREADY) IVPB 2000 mg/400 mL        2,000 mg 200 mL/hr over 120 Minutes Intravenous  Once 12/03/22 1657 12/03/22 2020   12/03/22 1700  piperacillin-tazobactam (ZOSYN) IVPB 3.375 g        3.375 g 100 mL/hr over 30 Minutes Intravenous  Once 12/03/22 1657 12/03/22 1804       Subjective: Seen and examined at bedside and he was sleepy but arousable.  States his breathing is doing okay.  Continues to have abdominal discomfort.  No nausea or vomiting.  Thinks he is doing all right otherwise.  Objective: Vitals:   12/18/22 0306 12/18/22 0808 12/18/22 1119 12/18/22 1633   BP: (!) 155/67 137/71 130/67 110/65  Pulse: 73 62 61 (!) 54  Resp: 18 18 16 13   Temp: 98.2 F (36.8 C) 98.2 F (36.8 C) 97.8 F (36.6 C) 98.1 F (36.7 C)  TempSrc: Oral Oral Oral Oral  SpO2: 98% 96% 95% 95%  Weight:      Height:        Intake/Output Summary (Last 24 hours) at 12/18/2022 1906 Last data filed at 12/18/2022 2841 Gross per 24 hour  Intake 1659.66 ml  Output 1950 ml  Net -290.34 ml   Filed Weights   12/15/22 0500 12/16/22 0429 12/17/22 0544  Weight: 100.9 kg 99.3 kg 99.8 kg   Examination: Physical Exam:  Constitutional: WN/WD overweight chronically ill-appearing Caucasian male who is resting Respiratory: Diminished to auscultation bilaterally with some coarse breath sounds and has some rhonchi and some slight crackles, no wheezing, rales, rhonchi or crackles. Normal respiratory effort and patient is not tachypenic. No accessory muscle use.  Wearing supplemental oxygen via nasal cannula Cardiovascular: RRR, no murmurs / rubs / gallops. S1 and S2 auscultated.  Has a right AKA and has some left lower extremity edema that is chronic and has no other real changes Abdomen: Soft, tender to palpate.  Is distended has a colostomy and an abdominal incision with a JP drain noted. Bowel sounds positive.  GU: Deferred. Musculoskeletal: Has a right AKA.  Skin: No rashes, lesions, ulcers limited skin evaluation. No induration; Warm and dry.  Neurologic: CN 2-12 grossly intact with no focal deficits. Romberg sign and cerebellar reflexes not assessed.  Psychiatric: Normal judgment and insight. Alert and oriented x 3. Normal mood and appropriate affect.   Data Reviewed: I have personally reviewed following labs and imaging studies  CBC: Recent Labs  Lab 12/14/22 0115 12/14/22 1756 12/15/22 0056 12/16/22 0140 12/17/22 0148 12/17/22 1633 12/18/22 0135  WBC 12.2*  --  11.7* 10.1 12.1*  --  8.0  NEUTROABS  --   --   --  7.5 9.0*  --  5.2  HGB 7.3*   < > 8.6* 6.6* 7.0* 8.5*  10.2*  HCT 25.7*   < > 30.5* 22.4* 24.1* 27.3* 32.9*  MCV 97.0  --  99.0 96.1 95.3  --  94.3  PLT 391  --  452* 489* 492*  --  259   < > = values in this interval not displayed.   Basic Metabolic Panel: Recent Labs  Lab 12/13/22 0405 12/14/22 0115 12/15/22 0056 12/15/22 1038 12/16/22 0140 12/17/22 0148 12/18/22 0135  NA 143   < > 136 137 137 135 134*  K 4.8   < > 5.6* 5.3* 5.0 4.5 5.2*  CL 112*   < > 105 107 106 106 104  CO2 24   < > 22 20* 20* 20* 17*  GLUCOSE 194*   < > 135* 132* 156* 131* 149*  BUN 60*   < > 55* 52* 50* 45* 42*  CREATININE 2.27*   < > 2.05* 2.15* 2.08* 2.12* 2.02*  CALCIUM 7.9*   < > 8.5* 8.5* 8.1* 8.1* 8.4*  MG 2.0  --   --  1.8 1.6* 1.5* 1.7  PHOS 4.3  --   --  6.1* 5.6* 5.7* 5.5*   < > = values in this interval not displayed.   GFR: Estimated Creatinine Clearance: 45.8 mL/min (A) (by C-G formula based on SCr of 2.02 mg/dL (H)). Liver Function Tests: Recent Labs  Lab 12/15/22 1038 12/16/22 0140 12/17/22 0148 12/18/22 0135  AST 26 20 17 23   ALT 29 25 21 25   ALKPHOS 171* 151* 141* 163*  BILITOT 0.7 0.6 0.7 0.4  PROT 7.1 6.2* 5.9* 6.3*  ALBUMIN 1.9* 1.6* 1.5* 1.7*   No results for input(s): "LIPASE", "AMYLASE" in the last 168 hours. No results for input(s): "AMMONIA" in the last 168 hours. Coagulation Profile: No results for input(s): "INR", "PROTIME" in the last 168 hours. Cardiac Enzymes: No results for input(s): "CKTOTAL", "CKMB", "CKMBINDEX", "TROPONINI" in the last 168 hours. BNP (last 3 results) No results for input(s): "PROBNP" in the last 8760 hours. HbA1C: No results for input(s): "HGBA1C" in the last 72 hours. CBG: Recent Labs  Lab 12/17/22 1615 12/17/22 2125 12/18/22 0607 12/18/22 1122 12/18/22 1635  GLUCAP 145* 127* 133* 112* 133*   Lipid Profile: No results for input(s): "CHOL", "HDL", "LDLCALC", "TRIG", "CHOLHDL", "LDLDIRECT" in the last 72 hours. Thyroid Function Tests: No results for input(s): "TSH", "T4TOTAL",  "FREET4", "T3FREE", "THYROIDAB" in the last 72 hours. Anemia Panel: Recent Labs    12/16/22 0140  VITAMINB12 2,427*  FOLATE 10.3  FERRITIN 647*  TIBC 171*  IRON 24*  RETICCTPCT 1.2   Sepsis Labs: No results for input(s): "PROCALCITON", "LATICACIDVEN" in the last 168 hours.  Recent Results (from the past 240 hour(s))  Culture, blood (Routine X 2) w Reflex to ID Panel     Status: None   Collection Time: 12/13/22 11:23 AM   Specimen: BLOOD LEFT HAND  Result Value Ref Range Status   Specimen Description BLOOD LEFT HAND  Final   Special Requests   Final    BOTTLES DRAWN AEROBIC AND ANAEROBIC Blood Culture adequate volume   Culture   Final    NO GROWTH 5 DAYS Performed at [facility], [address], [place], [place] [address]    Report Status 12/18/2022 FINAL  Final  Culture, blood (Routine X 2) w Reflex to ID Panel     Status: None   Collection Time: 12/13/22 11:23 AM   Specimen: BLOOD RIGHT HAND  Result Value Ref Range Status   Specimen Description BLOOD RIGHT HAND  Final   Special Requests   Final    BOTTLES DRAWN AEROBIC AND ANAEROBIC Blood Culture adequate volume   Culture   Final  NO GROWTH 5 DAYS Performed at [facility], [address], [place], [place] [address]    Report Status 12/18/2022 FINAL  Final    Radiology Studies: DG CHEST PORT 1 VIEW  Result Date: 12/18/2022 CLINICAL DATA:  Sepsis, shortness of breath. EXAM: PORTABLE CHEST 1 VIEW COMPARISON:  Chest radiograph 1 day prior FINDINGS: The cardiomediastinal silhouette is stable. Patchy opacities in both suprahilar regions are again noted, slightly improved on the right. There is no new or worsening focal airspace opacity. There is no overt pulmonary edema. There is no pleural effusion or pneumothorax There is no acute osseous abnormality. IMPRESSION: Patchy opacities in both suprahilar regions, improved on the right. New or worsening focal airspace disease. Electronically Signed   By: Lesia Hausen  M.D.   On: 12/18/2022 10:10   DG CHEST PORT 1 VIEW  Result Date: 12/17/2022 CLINICAL DATA:  Sepsis and shortness of breath EXAM: PORTABLE CHEST 1 VIEW COMPARISON:  Chest radiograph dated 12/16/2022 FINDINGS: Lines/tubes: None. Lungs: Persistent left retrocardiac patchy opacities. New right mid lung patchy opacity. Pleura: No pneumothorax or pleural effusion. Heart/mediastinum: The heart size and mediastinal contours are within normal limits. Bones: Cervical spinal fixation hardware appears intact. IMPRESSION: Persistent left retrocardiac patchy opacities and new right mid lung patchy opacity, likely atelectasis. Aspiration or pneumonia can be considered in the appropriate clinical setting. Electronically Signed   By: Agustin Cree M.D.   On: 12/17/2022 09:04    Scheduled Meds:  acetaminophen  650 mg Oral Q6H   amiodarone  200 mg Oral Daily   arformoterol  15 mcg Nebulization BID   aspirin EC  81 mg Oral Daily   atorvastatin  80 mg Oral Daily   budesonide (PULMICORT) nebulizer solution  0.5 mg Nebulization BID   carvedilol  12.5 mg Oral BID WC   Chlorhexidine Gluconate Cloth  6 each Topical Daily   escitalopram  20 mg Oral Daily   feeding supplement  237 mL Oral BID BM   furosemide  40 mg Intravenous Once   gabapentin  200 mg Oral Q8H   guaiFENesin  1,200 mg Oral BID   heparin injection (subcutaneous)  5,000 Units Subcutaneous Q8H   hydrALAZINE  10 mg Oral Q8H   insulin aspart  0-20 Units Subcutaneous TID WC   insulin aspart  0-5 Units Subcutaneous QHS   insulin glargine-yfgn  4 Units Subcutaneous BID   leptospermum manuka honey  1 Application Topical Daily   lidocaine  2 patch Transdermal Daily   loperamide  2 mg Oral QID   methadone  10 mg Oral Q8H   methocarbamol  1,000 mg Oral Q8H   multivitamin with minerals  1 tablet Oral Daily   nutrition supplement (JUVEN)  1 packet Oral BID BM   mouth rinse  15 mL Mouth Rinse TID AC & HS   pantoprazole  40 mg Oral QHS   QUEtiapine  100 mg Oral  QHS   revefenacin  175 mcg Nebulization Daily   sodium bicarbonate  650 mg Oral BID   sodium zirconium cyclosilicate  10 g Oral Q6H   tamsulosin  0.4 mg Oral Daily   Continuous Infusions:  sodium chloride Stopped (12/14/22 1901)    LOS: 15 days   Marguerita Merles, DO [facility] Available via Epic secure chat 7am-7pm After these hours, please refer to coverage provider listed on [email or web address] 12/18/2022, 7:06 PM

## 2022-12-19 ENCOUNTER — Inpatient Hospital Stay (HOSPITAL_COMMUNITY): Payer: Medicaid Other

## 2022-12-19 DIAGNOSIS — E43 Unspecified severe protein-calorie malnutrition: Secondary | ICD-10-CM

## 2022-12-19 DIAGNOSIS — R6521 Severe sepsis with septic shock: Secondary | ICD-10-CM | POA: Diagnosis not present

## 2022-12-19 DIAGNOSIS — A419 Sepsis, unspecified organism: Secondary | ICD-10-CM | POA: Diagnosis not present

## 2022-12-19 LAB — GLUCOSE, CAPILLARY
Glucose-Capillary: 139 mg/dL — ABNORMAL HIGH (ref 70–99)
Glucose-Capillary: 119 mg/dL — ABNORMAL HIGH (ref 70–99)
Glucose-Capillary: 137 mg/dL — ABNORMAL HIGH (ref 70–99)
Glucose-Capillary: 99 mg/dL (ref 70–99)

## 2022-12-19 LAB — COMPREHENSIVE METABOLIC PANEL
ALT: 25 U/L (ref 0–44)
AST: 27 U/L (ref 15–41)
Albumin: <1.5 g/dL — ABNORMAL LOW (ref 3.5–5.0)
Alkaline Phosphatase: 178 U/L — ABNORMAL HIGH (ref 38–126)
Anion gap: 9 (ref 5–15)
BUN: 37 mg/dL — ABNORMAL HIGH (ref 8–23)
CO2: 20 mmol/L — ABNORMAL LOW (ref 22–32)
Calcium: 8.3 mg/dL — ABNORMAL LOW (ref 8.9–10.3)
Chloride: 103 mmol/L (ref 98–111)
Creatinine, Ser: 2.01 mg/dL — ABNORMAL HIGH (ref 0.61–1.24)
GFR, Estimated: 37 mL/min — ABNORMAL LOW (ref >60)
Glucose, Bld: 161 mg/dL — ABNORMAL HIGH (ref 70–99)
Potassium: 4.9 mmol/L (ref 3.5–5.1)
Sodium: 132 mmol/L — ABNORMAL LOW (ref 135–145)
Total Bilirubin: 1 mg/dL (ref 0.3–1.2)
Total Protein: 6.2 g/dL — ABNORMAL LOW (ref 6.5–8.1)

## 2022-12-19 LAB — CBC WITH DIFFERENTIAL/PLATELET
Abs Immature Granulocytes: 0.14 10*3/uL — ABNORMAL HIGH (ref 0.00–0.07)
Basophils Absolute: 0.1 10*3/uL (ref 0.0–0.1)
Basophils Relative: 1 %
Eosinophils Absolute: 0.2 10*3/uL (ref 0.0–0.5)
Eosinophils Relative: 2 %
HCT: 26.5 % — ABNORMAL LOW (ref 39.0–52.0)
Hemoglobin: 8.3 g/dL — ABNORMAL LOW (ref 13.0–17.0)
Immature Granulocytes: 2 %
Lymphocytes Relative: 17 %
Lymphs Abs: 1.5 10*3/uL (ref 0.7–4.0)
MCH: 29.3 pg (ref 26.0–34.0)
MCHC: 31.3 g/dL (ref 30.0–36.0)
MCV: 93.6 fL (ref 80.0–100.0)
Monocytes Absolute: 1 10*3/uL (ref 0.1–1.0)
Monocytes Relative: 11 %
Neutro Abs: 6.2 10*3/uL (ref 1.7–7.7)
Neutrophils Relative %: 67 %
Platelets: 549 10*3/uL — ABNORMAL HIGH (ref 150–400)
RBC: 2.83 MIL/uL — ABNORMAL LOW (ref 4.22–5.81)
RDW: 15.1 % (ref 11.5–15.5)
WBC: 9.1 10*3/uL (ref 4.0–10.5)
nRBC: 0 % (ref 0.0–0.2)

## 2022-12-19 LAB — MAGNESIUM: Magnesium: 2.1 mg/dL (ref 1.7–2.4)

## 2022-12-19 LAB — PHOSPHORUS: Phosphorus: 5.2 mg/dL — ABNORMAL HIGH (ref 2.5–4.6)

## 2022-12-19 MED ORDER — LOPERAMIDE HCL 2 MG PO CAPS
2.0000 mg | ORAL_CAPSULE | Freq: Two times a day (BID) | ORAL | Status: DC
  Administered 2022-12-19 – 2022-12-21 (×3): 2 mg via ORAL
  Filled 2022-12-19 (×4): qty 1

## 2022-12-19 NOTE — Progress Notes (Signed)
Physical Therapy Treatment Patient Details Name: Gregory [name] MRN: [id number] DOB: 11/08/1960 Today's Date: 12/19/2022   History of Present Illness Gregory [name] is a 62 y.o. man admitted 6/17 with abdominal pain, and urinary incontinence.  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 CT revealed right occipital infarct. 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    PT Comments  Pt supine in bed on arrival.  He is adamant that he is not moving OOB or attempting OOB at this time.  He was agreeable to bed level therapies including rolling to R and L side and coming to long sitting.  He presents with thick brown drainage from anus with foul smell.  MD reports this is from his rectal stump.  Continue to recommend rehab in a post acute setting to improve function to assist caregiver in caring for patient at [facility] home.       Assistance Recommended at Discharge Frequent or constant Supervision/Assistance  If plan is discharge home, recommend the following:  Can travel by private vehicle    Two people to help with walking and/or transfers;Two people to help with bathing/dressing/bathroom;Assistance with cooking/housework;Direct supervision/assist for medications management;Assistance with feeding;Direct supervision/assist for financial management;Assist for transportation;Help with stairs or ramp for entrance   No  Equipment Recommendations  Other (comment) (defer)    Recommendations for Other Services       Precautions / Restrictions Precautions Precautions: Fall Precaution Comments: JP drain, colectomy with abdominal incision, 2LO2 Restrictions Weight Bearing Restrictions: Yes     Mobility  Bed Mobility Overal bed  mobility: Needs Assistance Bed Mobility: Rolling Rolling: +2 for safety/equipment, Max assist, Mod assist   Supine to sit: Min assist, +2 for physical assistance     General bed mobility comments: Pt performed rolling to R and L side this session to clean patient of bowel and urinary incontince.  Foul smelling thick brown content remains to ooze from his rectum, MD reports this is from his rectal stump.  Pt able to reach for rails and assist in rolling.  He refused to sit edge of bed this session.  Pt was agreeable to sit in long sitting with bed placed in reverse trendelenberg he was sitting x 5 min.  Pt fatigues quickly and requested to lie down.    Transfers                        Ambulation/Gait                   Stairs             Wheelchair Mobility     Tilt Bed    Modified Rankin (Stroke Patients Only)       Balance Overall balance assessment: Needs assistance   Sitting balance-Leahy Scale: Fair Sitting balance - Comments: in long sitting with itermittent support of B rails.                                    Cognition Arousal/Alertness: Awake/alert Behavior During Therapy: Flat affect, Agitated Overall Cognitive Status: Impaired/Different from baseline Area of Impairment: Following commands, Safety/judgement, Problem solving  Following Commands: Follows one step commands with increased time Safety/Judgement: Decreased awareness of safety, Decreased awareness of deficits   Problem Solving: Slow processing, Requires verbal cues, Requires tactile cues General Comments: pt irritable about participating in PT, states he is in too much pain and is self-limiting        Exercises General Exercises - Lower Extremity Heel Slides: AROM, Left, 10 reps, Supine Shoulder Exercises Shoulder Flexion: AROM, Both, 10 reps, Supine Other Exercises Other Exercises: seated chest press x 10 in BUE.     General Comments        Pertinent Vitals/Pain Pain Assessment Pain Assessment: Faces Faces Pain Scale: Hurts worst Pain Location: abdomen Pain Descriptors / Indicators: Sore, Discomfort Pain Intervention(s): Monitored during session, Repositioned    Home Living                          Prior Function            PT Goals (current goals can now be found in the care plan section) Acute Rehab PT Goals Patient Stated Goal: to get his sprite Potential to Achieve Goals: Fair Progress towards PT goals: Progressing toward goals    Frequency    Min 2X/week      PT Plan Current plan remains appropriate    Co-evaluation PT/OT/SLP Co-Evaluation/Treatment: Yes Reason for Co-Treatment: Complexity of the patient's impairments (multi-system involvement) PT goals addressed during session: Mobility/safety with mobility OT goals addressed during session: ADL's and self-care      AM-PAC PT "6 Clicks" Mobility   Outcome Measure  Help needed turning from your back to your side while in a flat bed without using bedrails?: A Lot Help needed moving from lying on your back to sitting on the side of a flat bed without using bedrails?: A Lot Help needed moving to and from a bed to a chair (including a wheelchair)?: Total Help needed standing up from a chair using your arms (e.g., wheelchair or bedside chair)?: Total Help needed to walk in hospital room?: Total Help needed climbing 3-5 steps with a railing? : Total 6 Click Score: 8    End of Session   Activity Tolerance: Patient limited by pain;Treatment limited secondary to agitation Patient left: in bed;with call bell/phone within reach;with bed alarm set Nurse Communication: Mobility status PT Visit Diagnosis: Muscle weakness (generalized) (M62.81)     Time: 1610-9604 PT Time Calculation (min) (ACUTE ONLY): 33 min  Charges:    $Therapeutic Activity: 8-22 mins PT General Charges $$ ACUTE PT VISIT: 1 Visit                      Bonney Leitz , PTA Acute Rehabilitation Services Office [phone]    Florestine Avers 12/19/2022, 2:38 PM

## 2022-12-19 NOTE — Progress Notes (Signed)
Patient refused to get cleaned up and check his dressing, education provided

## 2022-12-19 NOTE — Progress Notes (Signed)
PROGRESS NOTE    Gregory [name]  VWU:[id number] DOB: 07/17/1960 DOA: 12/03/2022 PCP: Sherron Monday, MD   Brief Narrative:  62 year old Caucasian male with a past medical history significant for but not limited to COPD on home oxygen, diabetes mellitus type 2 status post right lower extremity AKA as well as other comorbidities who is a SNF resident at [facility] with frequent admissions to hospital.  On 12/03/2022 he presented to the ED with abdominal pain, urinary incontinence and hypoxia requiring 4 L nasal cannula.  His lactic acid level was 3 and he was given fluid bolus and repeat lactic acid was 4.  CT was concerning with an ileus with dilatation of the large intestine with stool balls in the distal colon.  General surgery was consulted given his rising lactic acid and concern for mesenteric ischemia.  He was admitted to the critical care services given his elevated WBC at 28.7. started on IV vancomycin and Zosyn.  On 12/04/2022 he had worsening lactic acidosis and the decision was made to take the patient to the OR for exploration and patient was found to have ischemic colon and underwent a subtotal abdominal colectomy and end ileostomy.  Postoperative setting he presented to ICU and was intubated and was hypotensive requiring multiple pressors including neo-, epi, Levophed and vasopressin along with a bicarbonate drip.  On 12/05/2022 his vasopressor requirements improved and he was extubated.  Current significant hospital events include the following:  Significant Hospital Events: Including procedures, antibiotic start and stop dates in addition to other pertinent events   6/17 admitted, s/p exploratory laparotomy for total colectomy due to ischemic colon 6/17 urine>> EColi>> 6/19 > extubated. A.Fib RVR - re intubated that night 6/22 developed A-fib RVR overnight resulting in need for amiodarone drip and reinitiation of Levophed.   6/23 Required addition amiodarone bolus overnight, NSR on am  exam  6/24 off pressors 6/25 ketamine stopped 6/27 Extubate 6/27 12/14/2018 for his potassium was increased and so case was discussed with nephrology and temporary measures were started on potassium now improving. 6/29, transferred to medical floor. 6/30, decreasing hemoglobin, total 2 units of PRBC given.  Assessment and Plan:  Septic Shock with E Coli UTI and Ischemic Colitis  -Improved -Off of Pressors -WBC Trend: Recent Labs  Lab 12/12/22 0234 12/13/22 0405 12/14/22 0115 12/15/22 0056 12/16/22 0140 12/17/22 0148 12/18/22 0135  WBC 11.7* 12.5* 12.2* 11.7* 10.1 12.1* 8.0  -Antibiotics completed on 12/09/22 -Continue to Monitor for S/Sx of Infection and Continue Pulmonary Toilet As below  Acute Hypoxic Respiratory Failure and Aspiration Pneumonitis, Improved COPD -Continue arformoterol 15 mcg nebs twice daily, budesonide 0.5 mg nebs twice daily, and revefenacin 175 mcg nebs daily -Patient sounded congested 12/16/22 so repeat chest x-ray was done and showed "Lordotic positioning noted. Heart size is within normal limits. Low lung volumes noted. Interstitial infiltrates show improvement since previous study, consistent with decreased pneumonitis or edema. No evidence of pulmonary consolidation or pleural effusion. Cervical spine fusion hardware noted." -Repeat CXR 12/18/22 shows "Patchy opacities in both suprahilar regions, improved on the right. New or worsening focal airspace disease." -Received IV Lasix the last 2 days  -Continue with as needed albuterol as necessary -Continue with flutter valve, incentive spirometry and add guaifenesin 1200 mg p.o. twice daily -Repeat chest x-ray today shows slight improvement of aeration.  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 -C/w post op care per Surgery, nutrition per surgery   Paroxysmal atrial fibrillation in setting of acute stress.  HTN HLD PAD. -Had a Normal EF on Echo from 6/20 -Back in Sinus Rhythm -Continue  Amiodarone 200 mg po Daily, Carvedilol 12.5 mg po BID, Hydralazine 10 mg po q8h, ASA 81 mg po Daily and Atorvastatin 80 mg po Daily -Patient also has IV hydralazine 10 mg every 6 hours as needed for systolic blood pressure greater than 160 or diastolic blood pressure below 161 -Continue to Monitor BP per Protocol    AKI from ATN 2nd to sepsis on CKD Stage 3b. Hyperkalemia. Metabolic Acidosis -Baseline creatinine 1.60 from 09/21/22 -BUN/Cr Trend: Recent Labs  Lab 12/14/22 1524 12/14/22 2148 12/15/22 0056 12/15/22 1038 12/16/22 0140 12/17/22 0148 12/18/22 0135  BUN 56* 56* 55* 52* 50* 45* 42*  CREATININE 2.04* 2.05* 2.05* 2.15* 2.08* 2.12* 2.02*  -K+ Trend: Recent Labs  Lab 12/14/22 1756 12/14/22 2148 12/15/22 0056 12/15/22 1038 12/16/22 0140 12/17/22 0148 12/18/22 0135  K 5.5* 5.6* 5.6* 5.3* 5.0 4.5 5.2*  -Slight metabolic acidosis with a CO2 of 20, anion gap of 9, Chloride Level of 106 -Avoid Nephrotoxic Medications, Contrast Dyes, Hypotension and Dehydration to Ensure Adequate Renal Perfusion and will need to Renally Adjust Meds -Continue to Monitor and Trend Renal Function carefully and repeat CMP in the AM  -Continue to Monitor  urine outpt with high ostomy outpt and Output is slowing down and per Gen Surg   Anemia of critical illness and iron deficiency and of Chronic Kidney Disease. -Hgb/Hct Trend: Recent Labs  Lab 12/14/22 0115 12/14/22 1756 12/15/22 0056 12/16/22 0140 12/17/22 0148 12/17/22 1633 12/18/22 0135  HGB 7.3* 7.8* 8.6* 6.6* 7.0* 8.5* 10.2*  HCT 25.7* 23.0* 30.5* 22.4* 24.1* 27.3* 32.9*  MCV 97.0  --  99.0 96.1 95.3  --  94.3  -Transfuse for Hb < 7; CBC showing improvement -Transfused pRBC's to make 4 units total (transfused 2 units on 12/02/22 and on 12/16/22 and on 12/17/22) -Given IV iron infusion on 6/27 -Checked Anemia Panel and showed an iron level of 24, UIBC 147, TIBC 171, saturation ratios of 14%, ferritin of 647, folate level 10.3, vitamin B12  2427 -Continue to Monitor for S/Sx of Bleeding; No overt bleeding noted -Repeat CBC 8.3.  Recheck tomorrow morning.   DM Type 2 poorly controlled with Hyperglycemia. -Continue with resistant NovoLog sliding scale insulin before meals and at bedtime as well as Semglee 40 units twice daily -Glucose Level Trend: Recent Labs  Lab 12/14/22 1524 12/14/22 2148 12/15/22 0056 12/15/22 1038 12/16/22 0140 12/17/22 0148 12/18/22 0135  GLUCOSE 130* 150* 135* 132* 156* 131* 149*  -CBG Trend: Recent Labs  Lab 12/17/22 0540 12/17/22 1142 12/17/22 1615 12/17/22 2125 12/18/22 0607 12/18/22 1122 12/18/22 1635  GLUCAP 102* 194* 145* 127* 133* 112* 133*   BPH. -C/w Tamsulosin 0.4 mg Daily    Chronic neuropathic pain. -Dilaudid PCA d/c'eD -Currently on methadone. -Continue lexapro, neurontin, lidoderm patch, robaxin, seroquel  Pressure Injuries, poA Pressure Injury 09/05/22 Ankle Anterior;Left Stage 2 -  Partial thickness loss of dermis presenting as a shallow open injury with a red, pink wound bed without slough. full thickness wound, NOT a pressure injury (Active)  09/05/22 2250  Location: Ankle  Location Orientation: Anterior;Left  Staging: Stage 2 -  Partial thickness loss of dermis presenting as a shallow open injury with a red, pink wound bed without slough.  Wound Description (Comments): full thickness wound, NOT a pressure injury  Present on Admission: Yes     Pressure Injury 12/04/22 Buttocks Posterior;Proximal;Right Stage 2 -  Partial thickness  loss of dermis presenting as a shallow open injury with a red, pink wound bed without slough. (Active)  12/04/22 0410  Location: Buttocks  Location Orientation: Posterior;Proximal;Right  Staging: Stage 2 -  Partial thickness loss of dermis presenting as a shallow open injury with a red, pink wound bed without slough.  Wound Description (Comments):   Present on Admission: Yes     Pressure Injury 12/04/22 Thigh Left;Posterior Stage 3 -   Full thickness tissue loss. Subcutaneous fat may be visible but bone, tendon or muscle are NOT exposed. (Active)  12/04/22 0410  Location: Thigh  Location Orientation: Left;Posterior  Staging: Stage 3 -  Full thickness tissue loss. Subcutaneous fat may be visible but bone, tendon or muscle are NOT exposed.  Wound Description (Comments):   Present on Admission: Yes   Hypoalbuminemia -Patient's Albumin Trend: Recent Labs  Lab 12/04/22 0941 12/06/22 0900 12/07/22 1200 12/15/22 1038 12/16/22 0140 12/17/22 0148 12/18/22 0135  ALBUMIN 2.1* <1.5* 2.3* 1.9* 1.6* 1.5* 1.7*  -Continue to Monitor and Trend and repeat CMP in the AM  Obesity -Complicates overall prognosis and care -Estimated body mass index is 29.84 kg/m as calculated from the following:   Height as of this encounter: 6' (1.829 m).   Weight as of this encounter: 99.8 kg.  -Weight Loss and Dietary Counseling given   DVT prophylaxis: heparin injection 5,000 Units Start: 12/08/22 1400    Code Status: Full Code Family Communication: No family present at bedside  Disposition Plan:  Level of care: Progressive Status is: Inpatient Remains inpatient appropriate because: Needs further clinical improvement to go back to SNF and clearance by general surgery   Consultants:  PCCM transfer General surgery  Procedures:  As delineated as above  Antimicrobials:  Anti-infectives (From admission, onward)    Start     Dose/Rate Route Frequency Ordered Stop   12/05/22 1800  vancomycin (VANCOREADY) IVPB 1250 mg/250 mL  Status:  Discontinued        1,250 mg 166.7 mL/hr over 90 Minutes Intravenous Every 48 hours 12/03/22 2346 12/08/22 1148   12/05/22 1300  meropenem (MERREM) 1 g in sodium chloride 0.9 % 100 mL IVPB        1 g 200 mL/hr over 30 Minutes Intravenous Every 12 hours 12/05/22 1201 12/10/22 0700   12/04/22 0100  piperacillin-tazobactam (ZOSYN) IVPB 3.375 g  Status:  Discontinued        3.375 g 12.5 mL/hr over 240  Minutes Intravenous Every 8 hours 12/03/22 2346 12/05/22 1159   12/03/22 1700  vancomycin (VANCOREADY) IVPB 2000 mg/400 mL        2,000 mg 200 mL/hr over 120 Minutes Intravenous  Once 12/03/22 1657 12/03/22 2020   12/03/22 1700  piperacillin-tazobactam (ZOSYN) IVPB 3.375 g        3.375 g 100 mL/hr over 30 Minutes Intravenous  Once 12/03/22 1657 12/03/22 1804       Subjective:  Seen and examined.  Denies any nausea or vomiting.  He tells me he does not feel well.  Needs his pain medication right away.  He also needs Sprite with ice.  Objective: Vitals:   12/18/22 2306 12/19/22 0300 12/19/22 0746 12/19/22 1204  BP: 130/61 94/61 (!) 115/52 (!) 100/49  Pulse: 65 60 62 (!) 59  Resp: 17 19 19 16   Temp: 98.2 F (36.8 C) 97.6 F (36.4 C) 98 F (36.7 C) 98 F (36.7 C)  TempSrc: Oral Oral Oral Oral  SpO2: 100% 100% 94% 99%  Weight:  Height:        Intake/Output Summary (Last 24 hours) at 12/19/2022 1333 Last data filed at 12/19/2022 0600 Gross per 24 hour  Intake --  Output 1190 ml  Net -1190 ml   Filed Weights   12/15/22 0500 12/16/22 0429 12/17/22 0544  Weight: 100.9 kg 99.3 kg 99.8 kg   Examination: Physical Exam:  General: Chronically sick looking gentleman.  Not in any distress but anxious. Cardiovascular: S1-S2 normal.  Regular rate rhythm. Respiratory: Bilateral clear.  Some conducted upper airway sounds. Gastrointestinal: Soft.  Mildly tender to palpate.  Colostomy with loose stool.  JP drain with serosanguineous drainage.  Bowel sound present. Ext: Right above-knee amputation, left lower extremity with 1+ chronic pedal edema and stasis changes.   Data Reviewed: I have personally reviewed following labs and imaging studies  CBC: Recent Labs  Lab 12/15/22 0056 12/16/22 0140 12/17/22 0148 12/17/22 1633 12/18/22 0135 12/19/22 0047  WBC 11.7* 10.1 12.1*  --  8.0 9.1  NEUTROABS  --  7.5 9.0*  --  5.2 6.2  HGB 8.6* 6.6* 7.0* 8.5* 10.2* 8.3*  HCT 30.5* 22.4*  24.1* 27.3* 32.9* 26.5*  MCV 99.0 96.1 95.3  --  94.3 93.6  PLT 452* 489* 492*  --  259 549*   Basic Metabolic Panel: Recent Labs  Lab 12/15/22 1038 12/16/22 0140 12/17/22 0148 12/18/22 0135 12/19/22 0047  NA 137 137 135 134* 132*  K 5.3* 5.0 4.5 5.2* 4.9  CL 107 106 106 104 103  CO2 20* 20* 20* 17* 20*  GLUCOSE 132* 156* 131* 149* 161*  BUN 52* 50* 45* 42* 37*  CREATININE 2.15* 2.08* 2.12* 2.02* 2.01*  CALCIUM 8.5* 8.1* 8.1* 8.4* 8.3*  MG 1.8 1.6* 1.5* 1.7 2.1  PHOS 6.1* 5.6* 5.7* 5.5* 5.2*   GFR: Estimated Creatinine Clearance: 46 mL/min (A) (by C-G formula based on SCr of 2.01 mg/dL (H)). Liver Function Tests: Recent Labs  Lab 12/15/22 1038 12/16/22 0140 12/17/22 0148 12/18/22 0135 12/19/22 0047  AST 26 20 17 23 27   ALT 29 25 21 25 25   ALKPHOS 171* 151* 141* 163* 178*  BILITOT 0.7 0.6 0.7 0.4 1.0  PROT 7.1 6.2* 5.9* 6.3* 6.2*  ALBUMIN 1.9* 1.6* 1.5* 1.7* <1.5*   No results for input(s): "LIPASE", "AMYLASE" in the last 168 hours. No results for input(s): "AMMONIA" in the last 168 hours. Coagulation Profile: No results for input(s): "INR", "PROTIME" in the last 168 hours. Cardiac Enzymes: No results for input(s): "CKTOTAL", "CKMB", "CKMBINDEX", "TROPONINI" in the last 168 hours. BNP (last 3 results) No results for input(s): "PROBNP" in the last 8760 hours. HbA1C: No results for input(s): "HGBA1C" in the last 72 hours. CBG: Recent Labs  Lab 12/18/22 1122 12/18/22 1635 12/18/22 2112 12/19/22 0617 12/19/22 1124  GLUCAP 112* 133* 193* 139* 119*   Lipid Profile: No results for input(s): "CHOL", "HDL", "LDLCALC", "TRIG", "CHOLHDL", "LDLDIRECT" in the last 72 hours. Thyroid Function Tests: No results for input(s): "TSH", "T4TOTAL", "FREET4", "T3FREE", "THYROIDAB" in the last 72 hours. Anemia Panel: No results for input(s): "VITAMINB12", "FOLATE", "FERRITIN", "TIBC", "IRON", "RETICCTPCT" in the last 72 hours.  Sepsis Labs: No results for input(s):  "PROCALCITON", "LATICACIDVEN" in the last 168 hours.  Recent Results (from the past 240 hour(s))  Culture, blood (Routine X 2) w Reflex to ID Panel     Status: None   Collection Time: 12/13/22 11:23 AM   Specimen: BLOOD LEFT HAND  Result Value Ref Range Status   Specimen Description BLOOD  LEFT HAND  Final   Special Requests   Final    BOTTLES DRAWN AEROBIC AND ANAEROBIC Blood Culture adequate volume   Culture   Final    NO GROWTH 5 DAYS Performed at [facility], [address] [place], [place] [address]    Report Status 12/18/2022 FINAL  Final  Culture, blood (Routine X 2) w Reflex to ID Panel     Status: None   Collection Time: 12/13/22 11:23 AM   Specimen: BLOOD RIGHT HAND  Result Value Ref Range Status   Specimen Description BLOOD RIGHT HAND  Final   Special Requests   Final    BOTTLES DRAWN AEROBIC AND ANAEROBIC Blood Culture adequate volume   Culture   Final    NO GROWTH 5 DAYS Performed at [facility], [address], [place], [place] [address]    Report Status 12/18/2022 FINAL  Final    Radiology Studies: DG CHEST PORT 1 VIEW  Result Date: 12/19/2022 CLINICAL DATA:  Shortness of breath on exertion.  Sepsis. EXAM: PORTABLE CHEST 1 VIEW COMPARISON:  Radiographs 12/18/2022 and 12/17/2022.  CT 12/03/2022. FINDINGS: 0624 hours. Lordotic positioning. The heart size and mediastinal contours are stable. The aeration of the perihilar regions of both lungs appears mildly improved. Patchy left basilar opacity appears slightly increased. No new airspace disease on the right, pneumothorax or significant pleural effusion. The bones appear unremarkable. Telemetry leads overlie the chest. IMPRESSION: Mildly improved aeration of the perihilar regions of both lungs. Slightly increased patchy left basilar opacity, potentially atelectasis or early infiltrate. Electronically Signed   By: Carey Bullocks M.D.   On: 12/19/2022 09:37   DG CHEST PORT 1 VIEW  Result Date:  12/18/2022 CLINICAL DATA:  Sepsis, shortness of breath. EXAM: PORTABLE CHEST 1 VIEW COMPARISON:  Chest radiograph 1 day prior FINDINGS: The cardiomediastinal silhouette is stable. Patchy opacities in both suprahilar regions are again noted, slightly improved on the right. There is no new or worsening focal airspace opacity. There is no overt pulmonary edema. There is no pleural effusion or pneumothorax There is no acute osseous abnormality. IMPRESSION: Patchy opacities in both suprahilar regions, improved on the right. New or worsening focal airspace disease. Electronically Signed   By: Lesia Hausen M.D.   On: 12/18/2022 10:10    Scheduled Meds:  acetaminophen  650 mg Oral Q6H   amiodarone  200 mg Oral Daily   arformoterol  15 mcg Nebulization BID   aspirin EC  81 mg Oral Daily   atorvastatin  80 mg Oral Daily   budesonide (PULMICORT) nebulizer solution  0.5 mg Nebulization BID   carvedilol  12.5 mg Oral BID WC   Chlorhexidine Gluconate Cloth  6 each Topical Daily   escitalopram  20 mg Oral Daily   feeding supplement  237 mL Oral BID BM   gabapentin  200 mg Oral Q8H   guaiFENesin  1,200 mg Oral BID   heparin injection (subcutaneous)  5,000 Units Subcutaneous Q8H   hydrALAZINE  10 mg Oral Q8H   insulin aspart  0-20 Units Subcutaneous TID WC   insulin aspart  0-5 Units Subcutaneous QHS   insulin glargine-yfgn  4 Units Subcutaneous BID   leptospermum manuka honey  1 Application Topical Daily   lidocaine  2 patch Transdermal Daily   loperamide  2 mg Oral BID   methadone  10 mg Oral Q8H   methocarbamol  1,000 mg Oral Q8H   multivitamin with minerals  1 tablet Oral  Daily   nutrition supplement (JUVEN)  1 packet Oral BID BM   mouth rinse  15 mL Mouth Rinse TID AC & HS   pantoprazole  40 mg Oral QHS   QUEtiapine  100 mg Oral QHS   revefenacin  175 mcg Nebulization Daily   sodium bicarbonate  650 mg Oral BID   tamsulosin  0.4 mg Oral Daily   Continuous Infusions:  sodium chloride Stopped  (12/14/22 1901)    LOS: 16 days

## 2022-12-19 NOTE — Progress Notes (Signed)
Nutrition Follow-up  DOCUMENTATION CODES:   Severe malnutrition in context of acute illness/injury  INTERVENTION:   D/c ensure- patient refuses to drink it   Recommend mixing juven powder with sprite or lemon lime soda to yield better taste, so that patient drinks more of it   Encourage po intake    NUTRITION DIAGNOSIS:   Severe Malnutrition related to acute illness as evidenced by percent weight loss, energy intake < or equal to 50% for > or equal to 5 days. -ongoing   GOAL:   Patient will meet greater than or equal to 90% of their needs -not being met with po diet and ONS at this time   MONITOR:   PO intake, Supplement acceptance, Labs, Weight trends, Skin, I & O's  REASON FOR ASSESSMENT:   Consult Enteral/tube feeding initiation and management (trickle tube feeds only, no advancement)  ASSESSMENT:   Pt with hx of DM type 2, COPD, HTN, HLD, CHF, GERD, hx CVA, and osteomyelitis s/p Right AKA presented to ED from SNF with severe abdominal pain. Workup in ED worrisome for bowel ischemia.  Ensure refused/not given x 3 in past 24 hours    Visited patient at bedside who was sleeping and aroused easily. He reports he does not like any of the Ensure and wishes for RD to dc order. He also wishes to no longer drink the Juven, but RD encouraged him to try it mixed in a different beverage because his wounds would benefit from it. His RN was in the room at this time and is agreeable with mixing it in soda.   Patient reports abdominal pain related to ileostomy. He denies N/V.   RD observed his breakfast at bedside which had not been eaten and was delivered at 730am. Patient slept through breakfast and started eating cold bacon when he saw it. RD provided menu at bedside and helped him call to order lunch.   Plans to dc back to [facility] when clinically appropriate.   Due to patient's poor po intake and acute weight loss of at least 15# (6%) x 16 days, he meets criteria for  severe acute malnutrition   Labs: Na 132, Glu 161, BUN 37, Cr 2, phos 5.2, alk phos 178, Iron 24 Meds: lipitor, insulin, imodium, MVI, protonix, sodium bicarbonate, magnesium sulfate, lokelma, dolophine, flomax  PO: ~50% avg meal intake x last 2 documented meals  I/O's: -7.7 L since admission     Diet Order:   Diet Order             DIET SOFT Room service appropriate? Yes; Fluid consistency: Thin  Diet effective now                   EDUCATION NEEDS:   Education needs have been addressed  Skin:  Skin Assessment: Skin Integrity Issues: Skin Integrity Issues:: Stage II, Stage III, Incisions, Other (Comment) Stage II: R buttocks Stage III: L posterior thigh Incisions: abdomen Other: non-pressure wound (no location documented)  Last BM:  700 ml via ileostomy x 24hrs  Height:   Ht Readings from Last 1 Encounters:  12/12/22 6' (1.829 m)    Weight:   Wt Readings from Last 1 Encounters:  12/17/22 99.8 kg    Ideal Body Weight:  72.8 kg (adjusted by 10% for hx of R AKA)  BMI:  Body mass index is 29.84 kg/m.  Estimated Nutritional Needs:   Kcal:  2300-2500 kcal/d  Protein:  120-140g/d  Fluid:  2.2L/d  Leodis Rains, RDN, LDN  Clinical Nutrition

## 2022-12-19 NOTE — Progress Notes (Signed)
Patient refused dressing change, education provided.

## 2022-12-19 NOTE — Progress Notes (Signed)
Occupational Therapy Treatment Patient Details Name: Gregory [name] MRN: [id number] DOB: 24-Nov-1960 Today's Date: 12/19/2022   History of present illness Gregory [name] is a 62 y.o. man admitted 6/17 with abdominal pain, and urinary incontinence.  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 CT revealed right occipital infarct. 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   OT comments  Pt with slow progression towards goals, still remains limited by pain. Pt able to complete rolling mod-max A +2, increased difficulty rolling to L side. Pt able to pull self up into long sitting with min A +2 using bed rails. Pt able to remain in long sitting x5 min for UB bathing task. Pt presenting with impairments listed below, will follow acutely. Patient will benefit from continued inpatient follow up therapy, <3 hours/day to maximize safety/ind with ADLs/functional mobility.    Recommendations for follow up therapy are one component of a multi-disciplinary discharge planning process, led by the attending physician.  Recommendations may be updated based on patient status, additional functional criteria and insurance authorization.    Assistance Recommended at Discharge Frequent or constant Supervision/Assistance  Patient can return home with the following  Two people to help with walking and/or transfers;A lot of help with bathing/dressing/bathroom;Assistance with cooking/housework;Direct supervision/assist for medications management;Direct supervision/assist for financial management;Help with stairs or ramp for entrance;Assist for transportation   Equipment Recommendations  Other (comment) (defer)    Recommendations for Other Services PT consult     Precautions / Restrictions Precautions Precautions: Fall Precaution Comments: JP drain, colectomy with abdominal incision, 2LO2 Restrictions Weight Bearing Restrictions: Yes       Mobility Bed Mobility Overal bed mobility: Needs Assistance Bed Mobility: Rolling Rolling: +2 for safety/equipment, Max assist, Mod assist   Supine to sit: Min assist, +2 for physical assistance     General bed mobility comments: max  A +2 to roll to L side, mod A +2 to roll to R side    Transfers                   General transfer comment: pt declines     Balance Overall balance assessment: Needs assistance   Sitting balance-Leahy Scale: Fair Sitting balance - Comments: long sitting up on bed rails                                   ADL either performed or assessed with clinical judgement   ADL Overall ADL's : Needs assistance/impaired         Upper Body Bathing: Maximal assistance;Bed level                                  Extremity/Trunk Assessment Upper Extremity Assessment Upper Extremity Assessment: Generalized weakness   Lower Extremity Assessment Lower Extremity Assessment: Defer to PT evaluation        Vision   Additional Comments: 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   Perception Perception Perception: Not tested   Praxis Praxis Praxis: Not tested    Cognition Arousal/Alertness: Awake/alert Behavior During Therapy: Flat affect, Agitated Overall Cognitive Status: Impaired/Different from baseline Area of Impairment: Following commands,  Safety/judgement, Problem solving                       Following Commands: Follows one step commands with increased time Safety/Judgement: Decreased awareness of safety, Decreased awareness of deficits   Problem Solving: Slow processing, Requires verbal cues, Requires tactile cues General Comments: reports he "can't" when given multiple bed level mobility instructions, then is able to perform  task        Exercises General Exercises - Upper Extremity Shoulder Flexion: AROM, Both, Supine, 10 reps    Shoulder Instructions       General Comments VSS on RA    Pertinent Vitals/ Pain       Pain Assessment Pain Assessment: Faces Pain Score: 8  Faces Pain Scale: Hurts worst Pain Location: abdomen Pain Descriptors / Indicators: Sore, Discomfort Pain Intervention(s): Limited activity within patient's tolerance, Monitored during session, Repositioned  Home Living                                          Prior Functioning/Environment              Frequency  Min 1X/week        Progress Toward Goals  OT Goals(current goals can now be found in the care plan section)  Progress towards OT goals: Progressing toward goals  Acute Rehab OT Goals Patient Stated Goal: none stated OT Goal Formulation: With patient Time For Goal Achievement: 12/24/22 Potential to Achieve Goals: Good ADL Goals Pt Will Perform Grooming: with supervision;sitting Pt Will Transfer to Toilet: with mod assist;with +2 assist;squat pivot transfer;stand pivot transfer;bedside commode Additional ADL Goal #1: pt will perform bed mobility mod A in prep for ADLs  Plan Discharge plan remains appropriate;Frequency remains appropriate    Co-evaluation    PT/OT/SLP Co-Evaluation/Treatment: Yes Reason for Co-Treatment: Complexity of the patient's impairments (multi-system involvement) PT goals addressed during session: Mobility/safety with mobility OT goals addressed during session: ADL's and self-care      AM-PAC OT "6 Clicks" Daily Activity     Outcome Measure   Help from another person eating meals?: A Little Help from another person taking care of personal grooming?: A Little Help from another person toileting, which includes using toliet, bedpan, or urinal?: Total Help from another person bathing (including washing, rinsing, drying)?: A Lot Help from another person to put  on and taking off regular upper body clothing?: A Lot Help from another person to put on and taking off regular lower body clothing?: A Lot 6 Click Score: 13    End of Session    OT Visit Diagnosis: Muscle weakness (generalized) (M62.81)   Activity Tolerance Patient tolerated treatment well   Patient Left in bed;with call bell/phone within reach;with bed alarm set;with nursing/sitter in room   Nurse Communication Mobility status        Time: 1610-9604 OT Time Calculation (min): 33 min  Charges: OT General Charges $OT Visit: 1 Visit OT Treatments $Self Care/Home Management : 8-22 mins  Gregory [name], OTD, OTR/L SecureChat Preferred Acute Rehab [phone]   Gregory [name] 12/19/2022, 4:16 PM

## 2022-12-20 DIAGNOSIS — A419 Sepsis, unspecified organism: Secondary | ICD-10-CM | POA: Diagnosis not present

## 2022-12-20 DIAGNOSIS — R6521 Severe sepsis with septic shock: Secondary | ICD-10-CM | POA: Diagnosis not present

## 2022-12-20 DIAGNOSIS — E43 Unspecified severe protein-calorie malnutrition: Secondary | ICD-10-CM | POA: Diagnosis not present

## 2022-12-20 LAB — BASIC METABOLIC PANEL
Anion gap: 10 (ref 5–15)
BUN: 37 mg/dL — ABNORMAL HIGH (ref 8–23)
CO2: 18 mmol/L — ABNORMAL LOW (ref 22–32)
Calcium: 8.4 mg/dL — ABNORMAL LOW (ref 8.9–10.3)
Chloride: 102 mmol/L (ref 98–111)
Creatinine, Ser: 2.1 mg/dL — ABNORMAL HIGH (ref 0.61–1.24)
GFR, Estimated: 35 mL/min — ABNORMAL LOW (ref >60)
Glucose, Bld: 137 mg/dL — ABNORMAL HIGH (ref 70–99)
Potassium: 4.6 mmol/L (ref 3.5–5.1)
Sodium: 130 mmol/L — ABNORMAL LOW (ref 135–145)

## 2022-12-20 LAB — CBC WITH DIFFERENTIAL/PLATELET
Abs Immature Granulocytes: 0.26 10*3/uL — ABNORMAL HIGH (ref 0.00–0.07)
Basophils Absolute: 0.1 10*3/uL (ref 0.0–0.1)
Basophils Relative: 1 %
Eosinophils Absolute: 0.2 10*3/uL (ref 0.0–0.5)
Eosinophils Relative: 2 %
HCT: 26.3 % — ABNORMAL LOW (ref 39.0–52.0)
Hemoglobin: 8.1 g/dL — ABNORMAL LOW (ref 13.0–17.0)
Immature Granulocytes: 3 %
Lymphocytes Relative: 18 %
Lymphs Abs: 1.9 10*3/uL (ref 0.7–4.0)
MCH: 28.1 pg (ref 26.0–34.0)
MCHC: 30.8 g/dL (ref 30.0–36.0)
MCV: 91.3 fL (ref 80.0–100.0)
Monocytes Absolute: 1.2 10*3/uL — ABNORMAL HIGH (ref 0.1–1.0)
Monocytes Relative: 11 %
Neutro Abs: 6.9 10*3/uL (ref 1.7–7.7)
Neutrophils Relative %: 65 %
Platelets: 537 10*3/uL — ABNORMAL HIGH (ref 150–400)
RBC: 2.88 MIL/uL — ABNORMAL LOW (ref 4.22–5.81)
RDW: 15.2 % (ref 11.5–15.5)
WBC: 10.6 10*3/uL — ABNORMAL HIGH (ref 4.0–10.5)
nRBC: 0 % (ref 0.0–0.2)

## 2022-12-20 LAB — GLUCOSE, CAPILLARY
Glucose-Capillary: 92 mg/dL (ref 70–99)
Glucose-Capillary: 112 mg/dL — ABNORMAL HIGH (ref 70–99)
Glucose-Capillary: 158 mg/dL — ABNORMAL HIGH (ref 70–99)
Glucose-Capillary: 119 mg/dL — ABNORMAL HIGH (ref 70–99)

## 2022-12-20 LAB — PHOSPHORUS: Phosphorus: 5.1 mg/dL — ABNORMAL HIGH (ref 2.5–4.6)

## 2022-12-20 LAB — MAGNESIUM: Magnesium: 1.6 mg/dL — ABNORMAL LOW (ref 1.7–2.4)

## 2022-12-20 MED ORDER — PREGABALIN 100 MG PO CAPS
100.0000 mg | ORAL_CAPSULE | Freq: Two times a day (BID) | ORAL | Status: DC
  Administered 2022-12-20 – 2022-12-25 (×11): 100 mg via ORAL
  Filled 2022-12-20 (×11): qty 1

## 2022-12-20 MED ORDER — ALUM & MAG HYDROXIDE-SIMETH 200-200-20 MG/5ML PO SUSP
30.0000 mL | Freq: Four times a day (QID) | ORAL | Status: DC | PRN
  Administered 2022-12-20 – 2022-12-24 (×2): 30 mL via ORAL
  Filled 2022-12-20 (×3): qty 30

## 2022-12-20 MED ORDER — MAGNESIUM SULFATE 2 GM/50ML IV SOLN
2.0000 g | Freq: Once | INTRAVENOUS | Status: AC
  Administered 2022-12-20: 2 g via INTRAVENOUS
  Filled 2022-12-20: qty 50

## 2022-12-20 MED ORDER — OXYCODONE HCL 5 MG PO TABS
10.0000 mg | ORAL_TABLET | ORAL | Status: DC | PRN
  Administered 2022-12-20 – 2022-12-25 (×21): 10 mg via ORAL
  Filled 2022-12-20 (×22): qty 2

## 2022-12-20 NOTE — Progress Notes (Signed)
PROGRESS NOTE    Gregory [name]  ONG:[id number] DOB: 1960/07/25 DOA: 12/03/2022 PCP: Sherron Monday, MD   Brief Narrative:  62 year old Caucasian male with a past medical history significant for but not limited to COPD on home oxygen, diabetes mellitus type 2 status post right lower extremity AKA as well as other comorbidities who is a SNF resident at [facility] with frequent admissions to hospital.  On 12/03/2022 he presented to the ED with abdominal pain, urinary incontinence and hypoxia requiring 4 L nasal cannula.  His lactic acid level was 3 and he was given fluid bolus and repeat lactic acid was 4.  CT was concerning with an ileus with dilatation of the large intestine with stool balls in the distal colon.  General surgery was consulted given his rising lactic acid and concern for mesenteric ischemia.  He was admitted to the critical care services given his elevated WBC at 28.7. started on IV vancomycin and Zosyn.  On 12/04/2022 he had worsening lactic acidosis and the decision was made to take the patient to the OR for exploration and patient was found to have ischemic colon and underwent a subtotal abdominal colectomy and end ileostomy.  Postoperative setting he presented to ICU and was intubated and was hypotensive requiring multiple pressors including neo-, epi, Levophed and vasopressin along with a bicarbonate drip.  On 12/05/2022 his vasopressor requirements improved and he was extubated.  Current significant hospital events include the following:  Significant Hospital Events: Including procedures, antibiotic start and stop dates in addition to other pertinent events   6/17 admitted, s/p exploratory laparotomy for total colectomy due to ischemic colon 6/17 urine>> EColi>> 6/19 > extubated. A.Fib RVR - re intubated that night 6/22 developed A-fib RVR overnight resulting in need for amiodarone drip and reinitiation of Levophed.   6/23 Required addition amiodarone bolus overnight, NSR on am  exam  6/24 off pressors 6/25 ketamine stopped 6/27 Extubate 6/27 12/14/2018 for his potassium was increased and so case was discussed with nephrology and temporary measures were started on potassium now improving. 6/29, transferred to medical floor. 6/30, decreasing hemoglobin, total 2 units of PRBC given.  Assessment and Plan:  Septic Shock with E Coli UTI and Ischemic Colitis  -Improved -Off of Pressors -Antibiotics completed on 12/09/22 -Continue to Monitor for S/Sx of Infection and Continue Pulmonary Toilet.  Acute Hypoxic Respiratory Failure and Aspiration Pneumonitis, Improved COPD -Continue arformoterol 15 mcg nebs twice daily, budesonide 0.5 mg nebs twice daily, and revefenacin 175 mcg nebs daily -Continue with as needed albuterol as necessary -Continue with flutter valve, incentive spirometry and add guaifenesin 1200 mg p.o. twice daily -Repeat chest x-ray shows slight improvement of aeration.  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 -C/w post op care per Surgery, nutrition per surgery   Paroxysmal atrial fibrillation in setting of acute stress. HTN HLD PAD. -Had a Normal EF on Echo from 6/20 -Back in Sinus Rhythm -Continue Amiodarone 200 mg po Daily, Carvedilol 12.5 mg po BID, Hydralazine 10 mg po q8h, ASA 81 mg po Daily and Atorvastatin 80 mg po Daily   AKI from ATN 2nd to sepsis on CKD Stage 3b. Hyperkalemia. Metabolic Acidosis Hypomagnesemia -Baseline creatinine 1.60 from 09/21/22 -Avoid Nephrotoxic Medications, Contrast Dyes, Hypotension and Dehydration to Ensure Adequate Renal Perfusion and will need to Renally Adjust Meds -Continue to Monitor and Trend Renal Function carefully and repeat CMP in the AM  -Replace magnesium.   Anemia of critical illness and iron deficiency and of Chronic  Kidney Disease. -Transfuse for Hb < 7; CBC showing improvement -Transfused pRBC's to make 4 units total (transfused 2 units on 12/02/22 and on 12/16/22 and on  12/17/22) -Given IV iron infusion on 6/27 -Checked Anemia Panel and showed an iron level of 24, UIBC 147, TIBC 171, saturation ratios of 14%, ferritin of 647, folate level 10.3, vitamin B12 2427 -Continue to Monitor for S/Sx of Bleeding; No overt bleeding noted -Repeat CBC 8.3.  Recheck tomorrow morning.   DM Type 2 poorly controlled with Hyperglycemia. -Continue with resistant NovoLog sliding scale insulin before meals and at bedtime as well as Semglee 40 units twice daily -Blood sugars acceptable today.  BPH. -C/w Tamsulosin 0.4 mg Daily    Chronic neuropathic pain. -Dilaudid PCA d/c'eD -Currently on methadone. -Patient was on Lyrica, oxycodone, Lexapro, gabapentin, Seroquel before arriving to the hospital.  He is on chronic pain management.  Discontinue methadone.  Resume home pain management regimen.  Pressure Injuries, poA Pressure Injury 09/05/22 Ankle Anterior;Left Stage 2 -  Partial thickness loss of dermis presenting as a shallow open injury with a red, pink wound bed without slough. full thickness wound, NOT a pressure injury (Active)  09/05/22 2250  Location: Ankle  Location Orientation: Anterior;Left  Staging: Stage 2 -  Partial thickness loss of dermis presenting as a shallow open injury with a red, pink wound bed without slough.  Wound Description (Comments): full thickness wound, NOT a pressure injury  Present on Admission: Yes     Pressure Injury 12/04/22 Buttocks Posterior;Proximal;Right Stage 2 -  Partial thickness loss of dermis presenting as a shallow open injury with a red, pink wound bed without slough. (Active)  12/04/22 0410  Location: Buttocks  Location Orientation: Posterior;Proximal;Right  Staging: Stage 2 -  Partial thickness loss of dermis presenting as a shallow open injury with a red, pink wound bed without slough.  Wound Description (Comments):   Present on Admission: Yes     Pressure Injury 12/04/22 Thigh Left;Posterior Stage 3 -  Full thickness  tissue loss. Subcutaneous fat may be visible but bone, tendon or muscle are NOT exposed. (Active)  12/04/22 0410  Location: Thigh  Location Orientation: Left;Posterior  Staging: Stage 3 -  Full thickness tissue loss. Subcutaneous fat may be visible but bone, tendon or muscle are NOT exposed.  Wound Description (Comments):   Present on Admission: Yes   Hypoalbuminemia -Patient's Albumin Trend: Recent Labs  Lab 12/04/22 0941 12/06/22 0900 12/07/22 1200 12/15/22 1038 12/16/22 0140 12/17/22 0148 12/18/22 0135  ALBUMIN 2.1* <1.5* 2.3* 1.9* 1.6* 1.5* 1.7*  -Continue to Monitor and Trend and repeat CMP in the AM   DVT prophylaxis: heparin injection 5,000 Units Start: 12/08/22 1400    Code Status: Full Code Family Communication: No family present at bedside  Disposition Plan:  Level of care: Progressive,  Status is: Inpatient Remains inpatient appropriate because: Needs further clinical improvement to go back to SNF and clearance by general surgery   Consultants:  PCCM transfer General surgery  Procedures:  As delineated as above  Antimicrobials:  Anti-infectives (From admission, onward)    Start     Dose/Rate Route Frequency Ordered Stop   12/05/22 1800  vancomycin (VANCOREADY) IVPB 1250 mg/250 mL  Status:  Discontinued        1,250 mg 166.7 mL/hr over 90 Minutes Intravenous Every 48 hours 12/03/22 2346 12/08/22 1148   12/05/22 1300  meropenem (MERREM) 1 g in sodium chloride 0.9 % 100 mL IVPB  1 g 200 mL/hr over 30 Minutes Intravenous Every 12 hours 12/05/22 1201 12/10/22 0700   12/04/22 0100  piperacillin-tazobactam (ZOSYN) IVPB 3.375 g  Status:  Discontinued        3.375 g 12.5 mL/hr over 240 Minutes Intravenous Every 8 hours 12/03/22 2346 12/05/22 1159   12/03/22 1700  vancomycin (VANCOREADY) IVPB 2000 mg/400 mL        2,000 mg 200 mL/hr over 120 Minutes Intravenous  Once 12/03/22 1657 12/03/22 2020   12/03/22 1700  piperacillin-tazobactam (ZOSYN) IVPB 3.375  g        3.375 g 100 mL/hr over 30 Minutes Intravenous  Once 12/03/22 1657 12/03/22 1804       Subjective:  Patient seen and examined.  Today he looks fairly well.  Denies any complaints.  He thinks he can eat adequate.  Abdomen is bloated.  Urine is cloudy and blood of sediments.  Objective: Vitals:   12/19/22 2235 12/20/22 0315 12/20/22 0600 12/20/22 0921  BP: (!) 154/67 (!) 108/46 (!) 128/58 (!) 107/50  Pulse: 67 62  67  Resp: 18 17 20 20   Temp: 98.5 F (36.9 C) 97.9 F (36.6 C)  98.3 F (36.8 C)  TempSrc: Axillary Oral  Oral  SpO2:  99%  96%  Weight:  99.1 kg    Height:        Intake/Output Summary (Last 24 hours) at 12/20/2022 1132 Last data filed at 12/20/2022 0800 Gross per 24 hour  Intake --  Output 1165 ml  Net -1165 ml   Filed Weights   12/16/22 0429 12/17/22 0544 12/20/22 0315  Weight: 99.3 kg 99.8 kg 99.1 kg   Examination: Physical Exam:  General: Chronically sick looking gentleman.  Not in any distress .  More interactive today. Cardiovascular: S1-S2 normal.  Regular rate rhythm. Respiratory: Bilateral clear.  Some conducted upper airway sounds. Gastrointestinal: Soft.  Mildly tender to palpate.  Colostomy with loose stool.  JP drain with serosanguineous drainage.  Bowel sounds present. Ext: Right above-knee amputation, left lower extremity with 1+ chronic pedal edema and stasis changes along with transmetatarsal amputation.   Data Reviewed: I have personally reviewed following labs and imaging studies  CBC: Recent Labs  Lab 12/15/22 0056 12/16/22 0140 12/17/22 0148 12/17/22 1633 12/18/22 0135 12/19/22 0047  WBC 11.7* 10.1 12.1*  --  8.0 9.1  NEUTROABS  --  7.5 9.0*  --  5.2 6.2  HGB 8.6* 6.6* 7.0* 8.5* 10.2* 8.3*  HCT 30.5* 22.4* 24.1* 27.3* 32.9* 26.5*  MCV 99.0 96.1 95.3  --  94.3 93.6  PLT 452* 489* 492*  --  259 549*   Basic Metabolic Panel: Recent Labs  Lab 12/16/22 0140 12/17/22 0148 12/18/22 0135 12/19/22 0047 12/20/22 0042  NA  137 135 134* 132* 130*  K 5.0 4.5 5.2* 4.9 4.6  CL 106 106 104 103 102  CO2 20* 20* 17* 20* 18*  GLUCOSE 156* 131* 149* 161* 137*  BUN 50* 45* 42* 37* 37*  CREATININE 2.08* 2.12* 2.02* 2.01* 2.10*  CALCIUM 8.1* 8.1* 8.4* 8.3* 8.4*  MG 1.6* 1.5* 1.7 2.1 1.6*  PHOS 5.6* 5.7* 5.5* 5.2* 5.1*   GFR: Estimated Creatinine Clearance: 43.9 mL/min (A) (by C-G formula based on SCr of 2.1 mg/dL (H)). Liver Function Tests: Recent Labs  Lab 12/15/22 1038 12/16/22 0140 12/17/22 0148 12/18/22 0135 12/19/22 0047  AST 26 20 17 23 27   ALT 29 25 21 25 25   ALKPHOS 171* 151* 141* 163* 178*  BILITOT 0.7  0.6 0.7 0.4 1.0  PROT 7.1 6.2* 5.9* 6.3* 6.2*  ALBUMIN 1.9* 1.6* 1.5* 1.7* <1.5*   No results for input(s): "LIPASE", "AMYLASE" in the last 168 hours. No results for input(s): "AMMONIA" in the last 168 hours. Coagulation Profile: No results for input(s): "INR", "PROTIME" in the last 168 hours. Cardiac Enzymes: No results for input(s): "CKTOTAL", "CKMB", "CKMBINDEX", "TROPONINI" in the last 168 hours. BNP (last 3 results) No results for input(s): "PROBNP" in the last 8760 hours. HbA1C: No results for input(s): "HGBA1C" in the last 72 hours. CBG: Recent Labs  Lab 12/19/22 0617 12/19/22 1124 12/19/22 1616 12/19/22 2125 12/20/22 0558  GLUCAP 139* 119* 137* 99 92   Lipid Profile: No results for input(s): "CHOL", "HDL", "LDLCALC", "TRIG", "CHOLHDL", "LDLDIRECT" in the last 72 hours. Thyroid Function Tests: No results for input(s): "TSH", "T4TOTAL", "FREET4", "T3FREE", "THYROIDAB" in the last 72 hours. Anemia Panel: No results for input(s): "VITAMINB12", "FOLATE", "FERRITIN", "TIBC", "IRON", "RETICCTPCT" in the last 72 hours.  Sepsis Labs: No results for input(s): "PROCALCITON", "LATICACIDVEN" in the last 168 hours.  Recent Results (from the past 240 hour(s))  Culture, blood (Routine X 2) w Reflex to ID Panel     Status: None   Collection Time: 12/13/22 11:23 AM   Specimen: BLOOD LEFT  HAND  Result Value Ref Range Status   Specimen Description BLOOD LEFT HAND  Final   Special Requests   Final    BOTTLES DRAWN AEROBIC AND ANAEROBIC Blood Culture adequate volume   Culture   Final    NO GROWTH 5 DAYS Performed at [facility], [address] [place], [place] [address]    Report Status 12/18/2022 FINAL  Final  Culture, blood (Routine X 2) w Reflex to ID Panel     Status: None   Collection Time: 12/13/22 11:23 AM   Specimen: BLOOD RIGHT HAND  Result Value Ref Range Status   Specimen Description BLOOD RIGHT HAND  Final   Special Requests   Final    BOTTLES DRAWN AEROBIC AND ANAEROBIC Blood Culture adequate volume   Culture   Final    NO GROWTH 5 DAYS Performed at [facility], [address], [place], [place] [address]    Report Status 12/18/2022 FINAL  Final    Radiology Studies: DG CHEST PORT 1 VIEW  Result Date: 12/19/2022 CLINICAL DATA:  Shortness of breath on exertion.  Sepsis. EXAM: PORTABLE CHEST 1 VIEW COMPARISON:  Radiographs 12/18/2022 and 12/17/2022.  CT 12/03/2022. FINDINGS: 0624 hours. Lordotic positioning. The heart size and mediastinal contours are stable. The aeration of the perihilar regions of both lungs appears mildly improved. Patchy left basilar opacity appears slightly increased. No new airspace disease on the right, pneumothorax or significant pleural effusion. The bones appear unremarkable. Telemetry leads overlie the chest. IMPRESSION: Mildly improved aeration of the perihilar regions of both lungs. Slightly increased patchy left basilar opacity, potentially atelectasis or early infiltrate. Electronically Signed   By: Carey Bullocks M.D.   On: 12/19/2022 09:37    Scheduled Meds:  acetaminophen  650 mg Oral Q6H   amiodarone  200 mg Oral Daily   arformoterol  15 mcg Nebulization BID   aspirin EC  81 mg Oral Daily   atorvastatin  80 mg Oral Daily   budesonide (PULMICORT) nebulizer solution  0.5 mg Nebulization BID   carvedilol  12.5  mg Oral BID WC   Chlorhexidine Gluconate Cloth  6 each Topical Daily   escitalopram  20 mg Oral  Daily   gabapentin  200 mg Oral Q8H   guaiFENesin  1,200 mg Oral BID   heparin injection (subcutaneous)  5,000 Units Subcutaneous Q8H   hydrALAZINE  10 mg Oral Q8H   insulin aspart  0-20 Units Subcutaneous TID WC   insulin aspart  0-5 Units Subcutaneous QHS   insulin glargine-yfgn  4 Units Subcutaneous BID   leptospermum manuka honey  1 Application Topical Daily   lidocaine  2 patch Transdermal Daily   loperamide  2 mg Oral BID   methocarbamol  1,000 mg Oral Q8H   multivitamin with minerals  1 tablet Oral Daily   nutrition supplement (JUVEN)  1 packet Oral BID BM   mouth rinse  15 mL Mouth Rinse TID AC & HS   pantoprazole  40 mg Oral QHS   pregabalin  100 mg Oral BID   QUEtiapine  100 mg Oral QHS   revefenacin  175 mcg Nebulization Daily   sodium bicarbonate  650 mg Oral BID   tamsulosin  0.4 mg Oral Daily   Continuous Infusions:  sodium chloride Stopped (12/14/22 1901)    LOS: 17 days

## 2022-12-21 DIAGNOSIS — R6521 Severe sepsis with septic shock: Secondary | ICD-10-CM | POA: Diagnosis not present

## 2022-12-21 DIAGNOSIS — E43 Unspecified severe protein-calorie malnutrition: Secondary | ICD-10-CM | POA: Diagnosis not present

## 2022-12-21 DIAGNOSIS — A419 Sepsis, unspecified organism: Secondary | ICD-10-CM | POA: Diagnosis not present

## 2022-12-21 LAB — BASIC METABOLIC PANEL
Anion gap: 11 (ref 5–15)
BUN: 39 mg/dL — ABNORMAL HIGH (ref 8–23)
CO2: 18 mmol/L — ABNORMAL LOW (ref 22–32)
Calcium: 8.4 mg/dL — ABNORMAL LOW (ref 8.9–10.3)
Chloride: 100 mmol/L (ref 98–111)
Creatinine, Ser: 2.19 mg/dL — ABNORMAL HIGH (ref 0.61–1.24)
GFR, Estimated: 33 mL/min — ABNORMAL LOW (ref >60)
Glucose, Bld: 137 mg/dL — ABNORMAL HIGH (ref 70–99)
Potassium: 5.2 mmol/L — ABNORMAL HIGH (ref 3.5–5.1)
Sodium: 129 mmol/L — ABNORMAL LOW (ref 135–145)

## 2022-12-21 LAB — GLUCOSE, CAPILLARY
Glucose-Capillary: 109 mg/dL — ABNORMAL HIGH (ref 70–99)
Glucose-Capillary: 159 mg/dL — ABNORMAL HIGH (ref 70–99)
Glucose-Capillary: 171 mg/dL — ABNORMAL HIGH (ref 70–99)
Glucose-Capillary: 108 mg/dL — ABNORMAL HIGH (ref 70–99)

## 2022-12-21 MED ORDER — LOPERAMIDE HCL 2 MG PO CAPS
2.0000 mg | ORAL_CAPSULE | Freq: Three times a day (TID) | ORAL | Status: DC
  Administered 2022-12-21 – 2022-12-24 (×9): 2 mg via ORAL
  Filled 2022-12-21 (×9): qty 1

## 2022-12-21 MED ORDER — MORPHINE SULFATE (PF) 2 MG/ML IV SOLN
2.0000 mg | INTRAVENOUS | Status: DC | PRN
  Administered 2022-12-21 – 2022-12-25 (×12): 2 mg via INTRAVENOUS
  Filled 2022-12-21 (×15): qty 1

## 2022-12-21 MED ORDER — HYDROXYZINE HCL 25 MG PO TABS
25.0000 mg | ORAL_TABLET | Freq: Four times a day (QID) | ORAL | Status: DC | PRN
  Administered 2022-12-21 – 2022-12-24 (×5): 25 mg via ORAL
  Filled 2022-12-21 (×5): qty 1

## 2022-12-21 MED ORDER — MORPHINE SULFATE (PF) 2 MG/ML IV SOLN
4.0000 mg | Freq: Once | INTRAVENOUS | Status: AC
  Administered 2022-12-21: 4 mg via INTRAVENOUS
  Filled 2022-12-21: qty 2

## 2022-12-21 NOTE — Progress Notes (Signed)
PT Cancellation Note  Patient Details Name: Gregory [name] MRN: [id number] DOB: Mar 15, 1961   Cancelled Treatment:    Reason Eval/Treat Not Completed: (P) Pain limiting ability to participate (Pt reports significant pain in abdomen and LEs and declines all mobility. Will continue to follow per PT POC.)   [name] 12/21/2022, 4:10 PM

## 2022-12-21 NOTE — TOC Progression Note (Addendum)
Transition of Care [facility]) - Progression Note    Patient Details  Name: Gregory [name] MRN: [id number] Date of Birth: September 10, 1960  Transition of Care [facility]) CM/SW Contact  Dellie Burns [place], [place] Phone Number: 12/21/2022, 9:46 AM  Clinical Narrative:  Per MD, pt will likely be ready for dc back to [facility]. Updated Whitney in admissions who confirmed pt able to return over weekend. Will need to reach out to [place] with [name] to facilitate dc over weekend [phone]. SW will follow.   Dellie Burns, MSW, LCSW [phone] (coverage)      Expected Discharge Plan: Skilled Nursing Facility Barriers to Discharge: Continued Medical Work up  Expected Discharge Plan and Services In-house Referral: Clinical Social Work     Living arrangements for the past 2 months: Skilled Nursing Facility                                       Social Determinants of Health (SDOH) Interventions SDOH Screenings   Food Insecurity: No Food Insecurity (09/18/2022)  Housing: Low Risk  (09/18/2022)  Transportation Needs: No Transportation Needs (09/18/2022)  Utilities: Not At Risk (09/18/2022)  Depression (PHQ2-9): Low Risk  (03/08/2021)  Tobacco Use: High Risk (12/04/2022)    Readmission Risk Interventions    09/06/2022    1:48 PM 08/27/2022   12:46 PM 05/03/2022   11:53 AM  Readmission Risk Prevention Plan  Transportation Screening Complete Complete Complete  Medication Review Oceanographer) Complete Complete Complete  PCP or Specialist appointment within 3-5 days of discharge Complete Complete Complete  HRI or Home Care Consult Complete Complete Complete  SW Recovery Care/Counseling Consult Complete Complete Complete  Palliative Care Screening Not Applicable Not Applicable Not Applicable  Skilled Nursing Facility Complete Complete Complete

## 2022-12-21 NOTE — Consult Note (Addendum)
WOC Nurse ostomy follow up Pt plans to discharge back to SNF where he will have total assistance with pouch application and emptying, according to progress notes.  He does not look at the ileostomy or participate or ask questions regarding the pouch change procedure.  Current pouch has leaked large amt liquid brown stool into the abd full thickness wound and all over the patient and bed. Cleaned and changed abd wound dressing; surgical team is following for assessment and plan of care.  Bathed patient and changed linens. Applied barrier ring and 2 piece pouching system.  Stoma is red and viable, 1 1/2 inches, protrudes above skin level, large amt liquid brown stool. Intact skin surrounding.  Ordered 5 sets of supplies to room for staff nurse's use: use barrier ring [employer] [place] # [phone] and wafer [employer] [place] #[id number] and pouch [employer] [place] #[id number] Enrolled patient in [facility] Secure Start Discharge program: No; Pt plans to discharge back to SNF.  WOC team will assess weekly to determine if there are further needs.  Thank-you,  Cammie Mcgee MSN, RN, CWOCN, [employer], CNS [phone]

## 2022-12-21 NOTE — Progress Notes (Signed)
PROGRESS NOTE    Gregory [name]  ZOX:[id number] DOB: 1961-02-26 DOA: 12/03/2022 PCP: Sherron Monday, MD   Brief Narrative:  62 year old Caucasian male with a past medical history significant for but not limited to COPD on home oxygen, diabetes mellitus type 2 status post right lower extremity AKA as well as other comorbidities who is a SNF resident at [facility] with frequent admissions to hospital.  On 12/03/2022 he presented to the ED with abdominal pain, urinary incontinence and hypoxia requiring 4 L nasal cannula.  His lactic acid level was 3 and he was given fluid bolus and repeat lactic acid was 4.  CT was concerning with an ileus with dilatation of the large intestine with stool balls in the distal colon.  General surgery was consulted given his rising lactic acid and concern for mesenteric ischemia.  He was admitted to the critical care services given his elevated WBC at 28.7. started on IV vancomycin and Zosyn.  On 12/04/2022 he had worsening lactic acidosis and the decision was made to take the patient to the OR for exploration and patient was found to have ischemic colon and underwent a subtotal abdominal colectomy and end ileostomy.  Postoperative setting he presented to ICU and was intubated and was hypotensive requiring multiple pressors including neo-, epi, Levophed and vasopressin along with a bicarbonate drip.  On 12/05/2022 his vasopressor requirements improved and he was extubated.  Current significant hospital events include the following:  Significant Hospital Events: Including procedures, antibiotic start and stop dates in addition to other pertinent events   6/17 admitted, s/p exploratory laparotomy for total colectomy due to ischemic colon 6/17 urine>> EColi>> 6/19 > extubated. A.Fib RVR - re intubated that night 6/22 developed A-fib RVR overnight resulting in need for amiodarone drip and reinitiation of Levophed.   6/23 Required addition amiodarone bolus overnight, NSR on am  exam  6/24 off pressors 6/25 ketamine stopped 6/27 Extubate 6/27 12/14/2018 for his potassium was increased and so case was discussed with nephrology and temporary measures were started on potassium now improving. 6/29, transferred to medical floor. 6/30, decreasing hemoglobin, total 2 units of PRBC given.  Assessment and Plan:  Septic Shock with E Coli UTI and Ischemic Colitis  -Improved -Off of Pressors -Antibiotics completed on 12/09/22  Acute Hypoxic Respiratory Failure and Aspiration Pneumonitis, Improved COPD -Continue arformoterol 15 mcg nebs twice daily, budesonide 0.5 mg nebs twice daily, and revefenacin 175 mcg nebs daily -Continue with as needed albuterol as necessary -Continue with flutter valve, incentive spirometry and add guaifenesin 1200 mg p.o. twice daily -Repeat chest x-ray shows slight improvement of aeration.  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 -C/w post op care per Surgery, nutrition per surgery   Paroxysmal atrial fibrillation in setting of acute stress. HTN HLD PAD. -Had a Normal EF on Echo from 6/20 -Back in Sinus Rhythm -Continue Amiodarone 200 mg po Daily, Carvedilol 12.5 mg po BID, Hydralazine 10 mg po q8h, ASA 81 mg po Daily and Atorvastatin 80 mg po Daily   AKI from ATN 2nd to sepsis on CKD Stage 3b. Hyperkalemia. Metabolic Acidosis Hypomagnesemia -Baseline creatinine 1.60 from 09/21/22 -Avoid Nephrotoxic Medications, Contrast Dyes, Hypotension and Dehydration to Ensure Adequate Renal Perfusion and will need to Renally Adjust Meds -Continue to Monitor and Trend Renal Function carefully and repeat BMP in the AM  -Creatinine persistently around 2.  This may be his new baseline. -Magnesium replaced.   Anemia of critical illness and iron deficiency and of Chronic  Kidney Disease. -Transfuse for Hb < 7; CBC showing improvement -Transfused pRBC's to make 4 units total (transfused 2 units on 12/02/22 and on 12/16/22 and on  12/17/22) -Given IV iron infusion on 6/27 -Checked Anemia Panel and showed an iron level of 24, UIBC 147, TIBC 171, saturation ratios of 14%, ferritin of 647, folate level 10.3, vitamin B12 2427 -Continue to Monitor for S/Sx of Bleeding; No overt bleeding noted -Repeat CBC 8.1.  Recheck tomorrow morning.   DM Type 2 poorly controlled with Hyperglycemia. -Continue with resistant NovoLog sliding scale insulin before meals and at bedtime as well as Semglee 40 units twice daily -Blood sugars acceptable today.  BPH. -C/w Tamsulosin 0.4 mg Daily    Chronic neuropathic pain. -Dilaudid PCA d/c'eD -Currently on methadone. -Patient was on Lyrica, oxycodone, Lexapro, gabapentin, Seroquel before arriving to the hospital.  He is on chronic pain management.  Discontinue methadone.  Resume home pain management regimen.  Pressure Injuries, poA Pressure Injury 09/05/22 Ankle Anterior;Left Stage 2 -  Partial thickness loss of dermis presenting as a shallow open injury with a red, pink wound bed without slough. full thickness wound, NOT a pressure injury (Active)  09/05/22 2250  Location: Ankle  Location Orientation: Anterior;Left  Staging: Stage 2 -  Partial thickness loss of dermis presenting as a shallow open injury with a red, pink wound bed without slough.  Wound Description (Comments): full thickness wound, NOT a pressure injury  Present on Admission: Yes     Pressure Injury 12/04/22 Buttocks Posterior;Proximal;Right Stage 2 -  Partial thickness loss of dermis presenting as a shallow open injury with a red, pink wound bed without slough. (Active)  12/04/22 0410  Location: Buttocks  Location Orientation: Posterior;Proximal;Right  Staging: Stage 2 -  Partial thickness loss of dermis presenting as a shallow open injury with a red, pink wound bed without slough.  Wound Description (Comments):   Present on Admission: Yes     Pressure Injury 12/04/22 Thigh Left;Posterior Stage 3 -  Full thickness  tissue loss. Subcutaneous fat may be visible but bone, tendon or muscle are NOT exposed. (Active)  12/04/22 0410  Location: Thigh  Location Orientation: Left;Posterior  Staging: Stage 3 -  Full thickness tissue loss. Subcutaneous fat may be visible but bone, tendon or muscle are NOT exposed.  Wound Description (Comments):   Present on Admission: Yes   Hypoalbuminemia -Patient's Albumin Trend: Recent Labs  Lab 12/04/22 0941 12/06/22 0900 12/07/22 1200 12/15/22 1038 12/16/22 0140 12/17/22 0148 12/18/22 0135  ALBUMIN 2.1* <1.5* 2.3* 1.9* 1.6* 1.5* 1.7*  -Continue to Monitor and Trend and repeat CMP in the AM   DVT prophylaxis: heparin injection 5,000 Units Start: 12/08/22 1400    Code Status: Full Code Family Communication: No family present at bedside  Disposition Plan:  Level of care: Progressive,  Status is: Inpatient Remains inpatient appropriate because: Needs further clinical improvement to go back to SNF and clearance by general surgery   Consultants:  PCCM transfer General surgery  Procedures:  As delineated as above  Antimicrobials:  Anti-infectives (From admission, onward)    Start     Dose/Rate Route Frequency Ordered Stop   12/05/22 1800  vancomycin (VANCOREADY) IVPB 1250 mg/250 mL  Status:  Discontinued        1,250 mg 166.7 mL/hr over 90 Minutes Intravenous Every 48 hours 12/03/22 2346 12/08/22 1148   12/05/22 1300  meropenem (MERREM) 1 g in sodium chloride 0.9 % 100 mL IVPB  1 g 200 mL/hr over 30 Minutes Intravenous Every 12 hours 12/05/22 1201 12/10/22 0700   12/04/22 0100  piperacillin-tazobactam (ZOSYN) IVPB 3.375 g  Status:  Discontinued        3.375 g 12.5 mL/hr over 240 Minutes Intravenous Every 8 hours 12/03/22 2346 12/05/22 1159   12/03/22 1700  vancomycin (VANCOREADY) IVPB 2000 mg/400 mL        2,000 mg 200 mL/hr over 120 Minutes Intravenous  Once 12/03/22 1657 12/03/22 2020   12/03/22 1700  piperacillin-tazobactam (ZOSYN) IVPB 3.375  g        3.375 g 100 mL/hr over 30 Minutes Intravenous  Once 12/03/22 1657 12/03/22 1804       Subjective:  Patient seen and examined.  No overnight events.  He complains of intermittent abdominal pain.  Tolerating oral intake well.  Wants to go back on methadone however he is on oxycodone as well as Lyrica and gabapentin.  It will be challenging to use methadone as well as opiates at the same time.  Will manage his pain with opiates.  Waiting for surgical clearance to discharge back to nursing home.  Objective: Vitals:   12/20/22 1727 12/20/22 2106 12/21/22 0005 12/21/22 0812  BP: (!) 128/55 136/62 (!) 105/56 133/60  Pulse: 71 70 71 69  Resp: 20 20 18 20   Temp: 98.3 F (36.8 C) 98.3 F (36.8 C) 97.7 F (36.5 C) 98.4 F (36.9 C)  TempSrc: Oral Oral Oral Oral  SpO2: 96% 100% 96% 99%  Weight:    95.8 kg  Height:        Intake/Output Summary (Last 24 hours) at 12/21/2022 0957 Last data filed at 12/21/2022 0800 Gross per 24 hour  Intake 1000 ml  Output 3600 ml  Net -2600 ml    Filed Weights   12/17/22 0544 12/20/22 0315 12/21/22 0812  Weight: 99.8 kg 99.1 kg 95.8 kg   Examination: Physical Exam:  General: Chronically sick looking gentleman.  Not in any distress .   Cardiovascular: S1-S2 normal.  Regular rate rhythm. Respiratory: Bilateral clear.  No added sounds.  On room air. Gastrointestinal: Soft.  Mildly distended.  Bowel sound present.  Mildly tender to palpate.  Colostomy with loose stool.    Large midline incision with dressing intact.  Looks clean. Ext: Right above-knee amputation, left lower extremity with 1+ chronic pedal edema and stasis changes along with transmetatarsal amputation.   Data Reviewed: I have personally reviewed following labs and imaging studies  CBC: Recent Labs  Lab 12/16/22 0140 12/17/22 0148 12/17/22 1633 12/18/22 0135 12/19/22 0047 12/20/22 1143  WBC 10.1 12.1*  --  8.0 9.1 10.6*  NEUTROABS 7.5 9.0*  --  5.2 6.2 6.9  HGB 6.6*  7.0* 8.5* 10.2* 8.3* 8.1*  HCT 22.4* 24.1* 27.3* 32.9* 26.5* 26.3*  MCV 96.1 95.3  --  94.3 93.6 91.3  PLT 489* 492*  --  259 549* 537*    Basic Metabolic Panel: Recent Labs  Lab 12/16/22 0140 12/17/22 0148 12/18/22 0135 12/19/22 0047 12/20/22 0042 12/21/22 0123  NA 137 135 134* 132* 130* 129*  K 5.0 4.5 5.2* 4.9 4.6 5.2*  CL 106 106 104 103 102 100  CO2 20* 20* 17* 20* 18* 18*  GLUCOSE 156* 131* 149* 161* 137* 137*  BUN 50* 45* 42* 37* 37* 39*  CREATININE 2.08* 2.12* 2.02* 2.01* 2.10* 2.19*  CALCIUM 8.1* 8.1* 8.4* 8.3* 8.4* 8.4*  MG 1.6* 1.5* 1.7 2.1 1.6*  --   PHOS 5.6* 5.7*  5.5* 5.2* 5.1*  --     GFR: Estimated Creatinine Clearance: 41.5 mL/min (A) (by C-G formula based on SCr of 2.19 mg/dL (H)). Liver Function Tests: Recent Labs  Lab 12/15/22 1038 12/16/22 0140 12/17/22 0148 12/18/22 0135 12/19/22 0047  AST 26 20 17 23 27   ALT 29 25 21 25 25   ALKPHOS 171* 151* 141* 163* 178*  BILITOT 0.7 0.6 0.7 0.4 1.0  PROT 7.1 6.2* 5.9* 6.3* 6.2*  ALBUMIN 1.9* 1.6* 1.5* 1.7* <1.5*    No results for input(s): "LIPASE", "AMYLASE" in the last 168 hours. No results for input(s): "AMMONIA" in the last 168 hours. Coagulation Profile: No results for input(s): "INR", "PROTIME" in the last 168 hours. Cardiac Enzymes: No results for input(s): "CKTOTAL", "CKMB", "CKMBINDEX", "TROPONINI" in the last 168 hours. BNP (last 3 results) No results for input(s): "PROBNP" in the last 8760 hours. HbA1C: No results for input(s): "HGBA1C" in the last 72 hours. CBG: Recent Labs  Lab 12/20/22 0558 12/20/22 1219 12/20/22 1720 12/20/22 2108 12/21/22 0649  GLUCAP 92 112* 158* 119* 109*    Lipid Profile: No results for input(s): "CHOL", "HDL", "LDLCALC", "TRIG", "CHOLHDL", "LDLDIRECT" in the last 72 hours. Thyroid Function Tests: No results for input(s): "TSH", "T4TOTAL", "FREET4", "T3FREE", "THYROIDAB" in the last 72 hours. Anemia Panel: No results for input(s): "VITAMINB12",  "FOLATE", "FERRITIN", "TIBC", "IRON", "RETICCTPCT" in the last 72 hours.  Sepsis Labs: No results for input(s): "PROCALCITON", "LATICACIDVEN" in the last 168 hours.  Recent Results (from the past 240 hour(s))  Culture, blood (Routine X 2) w Reflex to ID Panel     Status: None   Collection Time: 12/13/22 11:23 AM   Specimen: BLOOD LEFT HAND  Result Value Ref Range Status   Specimen Description BLOOD LEFT HAND  Final   Special Requests   Final    BOTTLES DRAWN AEROBIC AND ANAEROBIC Blood Culture adequate volume   Culture   Final    NO GROWTH 5 DAYS Performed at [facility], [address], [place], [place] [address]    Report Status 12/18/2022 FINAL  Final  Culture, blood (Routine X 2) w Reflex to ID Panel     Status: None   Collection Time: 12/13/22 11:23 AM   Specimen: BLOOD RIGHT HAND  Result Value Ref Range Status   Specimen Description BLOOD RIGHT HAND  Final   Special Requests   Final    BOTTLES DRAWN AEROBIC AND ANAEROBIC Blood Culture adequate volume   Culture   Final    NO GROWTH 5 DAYS Performed at [facility], [address], [place], [place] [address]    Report Status 12/18/2022 FINAL  Final    Radiology Studies: No results found.  Scheduled Meds:  acetaminophen  650 mg Oral Q6H   amiodarone  200 mg Oral Daily   arformoterol  15 mcg Nebulization BID   aspirin EC  81 mg Oral Daily   atorvastatin  80 mg Oral Daily   budesonide (PULMICORT) nebulizer solution  0.5 mg Nebulization BID   carvedilol  12.5 mg Oral BID WC   Chlorhexidine Gluconate Cloth  6 each Topical Daily   escitalopram  20 mg Oral Daily   gabapentin  200 mg Oral Q8H   guaiFENesin  1,200 mg Oral BID   heparin injection (subcutaneous)  5,000 Units Subcutaneous Q8H   hydrALAZINE  10 mg Oral Q8H   insulin aspart  0-20 Units Subcutaneous TID WC   insulin aspart  0-5 Units Subcutaneous  QHS   insulin glargine-yfgn  4 Units Subcutaneous BID   leptospermum manuka honey  1 Application  Topical Daily   lidocaine  2 patch Transdermal Daily   loperamide  2 mg Oral BID   methocarbamol  1,000 mg Oral Q8H   multivitamin with minerals  1 tablet Oral Daily   nutrition supplement (JUVEN)  1 packet Oral BID BM   mouth rinse  15 mL Mouth Rinse TID AC & HS   pantoprazole  40 mg Oral QHS   pregabalin  100 mg Oral BID   QUEtiapine  100 mg Oral QHS   revefenacin  175 mcg Nebulization Daily   sodium bicarbonate  650 mg Oral BID   tamsulosin  0.4 mg Oral Daily   Continuous Infusions:  sodium chloride Stopped (12/14/22 1901)    LOS: 18 days

## 2022-12-21 NOTE — Progress Notes (Signed)
Patient refuses to receive a change of his colostomy bag and bath. Explained to patient bathing is considered a treatment and part of his careplan. Patient continues to refuse. Will advise on coming shift to encourage cooperation with careplan.

## 2022-12-21 NOTE — Progress Notes (Signed)
Appreciate RN removing drain yesterday. Noted ileostomy output increased again to over 1 L Increased imodium to TID and continue to monitor.  Continue soft/low residue diet.  Continue TWICE DAILY midline dressing changes. Noted he has refused these a few times in the last couple days.    Hosie Spangle, [name] [facility] Please see Amion for pager number during day hours 7:00am-4:30pm

## 2022-12-22 DIAGNOSIS — R6521 Severe sepsis with septic shock: Secondary | ICD-10-CM | POA: Diagnosis not present

## 2022-12-22 DIAGNOSIS — A419 Sepsis, unspecified organism: Secondary | ICD-10-CM | POA: Diagnosis not present

## 2022-12-22 DIAGNOSIS — E43 Unspecified severe protein-calorie malnutrition: Secondary | ICD-10-CM | POA: Diagnosis not present

## 2022-12-22 LAB — CBC WITH DIFFERENTIAL/PLATELET
Abs Immature Granulocytes: 0.34 10*3/uL — ABNORMAL HIGH (ref 0.00–0.07)
Basophils Absolute: 0.1 10*3/uL (ref 0.0–0.1)
Basophils Relative: 1 %
Eosinophils Absolute: 0.2 10*3/uL (ref 0.0–0.5)
Eosinophils Relative: 2 %
HCT: 26 % — ABNORMAL LOW (ref 39.0–52.0)
Hemoglobin: 8.1 g/dL — ABNORMAL LOW (ref 13.0–17.0)
Immature Granulocytes: 3 %
Lymphocytes Relative: 17 %
Lymphs Abs: 1.7 10*3/uL (ref 0.7–4.0)
MCH: 29 pg (ref 26.0–34.0)
MCHC: 31.2 g/dL (ref 30.0–36.0)
MCV: 93.2 fL (ref 80.0–100.0)
Monocytes Absolute: 1.2 10*3/uL — ABNORMAL HIGH (ref 0.1–1.0)
Monocytes Relative: 12 %
Neutro Abs: 6.6 10*3/uL (ref 1.7–7.7)
Neutrophils Relative %: 65 %
Platelets: 507 10*3/uL — ABNORMAL HIGH (ref 150–400)
RBC: 2.79 MIL/uL — ABNORMAL LOW (ref 4.22–5.81)
RDW: 15.3 % (ref 11.5–15.5)
WBC: 10.2 10*3/uL (ref 4.0–10.5)
nRBC: 0 % (ref 0.0–0.2)

## 2022-12-22 LAB — GLUCOSE, CAPILLARY
Glucose-Capillary: 81 mg/dL (ref 70–99)
Glucose-Capillary: 93 mg/dL (ref 70–99)
Glucose-Capillary: 144 mg/dL — ABNORMAL HIGH (ref 70–99)
Glucose-Capillary: 131 mg/dL — ABNORMAL HIGH (ref 70–99)

## 2022-12-22 LAB — BASIC METABOLIC PANEL
Anion gap: 8 (ref 5–15)
BUN: 48 mg/dL — ABNORMAL HIGH (ref 8–23)
CO2: 20 mmol/L — ABNORMAL LOW (ref 22–32)
Calcium: 8.6 mg/dL — ABNORMAL LOW (ref 8.9–10.3)
Chloride: 103 mmol/L (ref 98–111)
Creatinine, Ser: 2.34 mg/dL — ABNORMAL HIGH (ref 0.61–1.24)
GFR, Estimated: 30 mL/min — ABNORMAL LOW (ref >60)
Glucose, Bld: 116 mg/dL — ABNORMAL HIGH (ref 70–99)
Potassium: 5.3 mmol/L — ABNORMAL HIGH (ref 3.5–5.1)
Sodium: 131 mmol/L — ABNORMAL LOW (ref 135–145)

## 2022-12-22 MED ORDER — SODIUM CHLORIDE 0.9 % IV SOLN: INTRAVENOUS | Status: DC

## 2022-12-22 MED ORDER — SODIUM ZIRCONIUM CYCLOSILICATE 5 G PO PACK
5.0000 g | PACK | Freq: Every day | ORAL | Status: DC
  Administered 2022-12-22 – 2022-12-25 (×4): 5 g via ORAL
  Filled 2022-12-22 (×4): qty 1

## 2022-12-22 NOTE — Progress Notes (Signed)
PROGRESS NOTE    Gregory [name]  ZOX:[id number] DOB: May 25, 1961 DOA: 12/03/2022 PCP: Sherron Monday, MD   Brief Narrative:  62 year old Caucasian male with a past medical history significant for but not limited to COPD on home oxygen, diabetes mellitus type 2 status post right lower extremity AKA as well as other comorbidities who is a SNF resident at [facility] with frequent admissions to hospital.  On 12/03/2022 he presented to the ED with abdominal pain, urinary incontinence and hypoxia requiring 4 L nasal cannula.  His lactic acid level was 3 and he was given fluid bolus and repeat lactic acid was 4.  CT was concerning with an ileus with dilatation of the large intestine with stool balls in the distal colon.  General surgery was consulted given his rising lactic acid and concern for mesenteric ischemia.  He was admitted to the critical care services given his elevated WBC at 28.7. started on IV vancomycin and Zosyn.  On 12/04/2022 he had worsening lactic acidosis and the decision was made to take the patient to the OR for exploration and patient was found to have ischemic colon and underwent a subtotal abdominal colectomy and end ileostomy.  Postoperative setting he presented to ICU and was intubated and was hypotensive requiring multiple pressors including neo-, epi, Levophed and vasopressin along with a bicarbonate drip.  On 12/05/2022 his vasopressor requirements improved and he was extubated.  Current significant hospital events include the following:  Significant Hospital Events: Including procedures, antibiotic start and stop dates in addition to other pertinent events   6/17 admitted, s/p exploratory laparotomy for total colectomy due to ischemic colon 6/17 urine>> EColi>> 6/19 > extubated. A.Fib RVR - re intubated that night 6/22 developed A-fib RVR overnight resulting in need for amiodarone drip and reinitiation of Levophed.   6/23 Required addition amiodarone bolus overnight, NSR on am  exam  6/24 off pressors 6/25 ketamine stopped 6/27 Extubate 6/27 12/14/2018 for his potassium was increased and so case was discussed with nephrology and temporary measures were started on potassium now improving. 6/29, transferred to medical floor. 6/30, decreasing hemoglobin, total 2 units of PRBC given.  Assessment and Plan:  Septic Shock with E Coli UTI and Ischemic Colitis  -Improved -Off of Pressors -Antibiotics completed on 12/09/22  Acute Hypoxic Respiratory Failure and Aspiration Pneumonitis, Improved COPD -Continue arformoterol 15 mcg nebs twice daily, budesonide 0.5 mg nebs twice daily, and revefenacin 175 mcg nebs daily -Continue with as needed albuterol as necessary -Continue with flutter valve, incentive spirometry and add guaifenesin 1200 mg p.o. twice daily -Repeat chest x-ray shows slight improvement of aeration.  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 -C/w post op care per Surgery, currently with normal bowel function. -Remains on Imodium.   Paroxysmal atrial fibrillation in setting of acute stress. HTN HLD PAD. -Had a Normal EF on Echo from 6/20 -Back in Sinus Rhythm -Continue Amiodarone 200 mg po Daily, Carvedilol 12.5 mg po BID, Hydralazine 10 mg po q8h, ASA 81 mg po Daily and Atorvastatin 80 mg po Daily   AKI from ATN 2nd to sepsis on CKD Stage 3b. Hyperkalemia. Metabolic Acidosis Hypomagnesemia -Baseline creatinine 1.60 from 09/21/22 -Avoid Nephrotoxic Medications, Contrast Dyes, Hypotension and Dehydration to Ensure Adequate Renal Perfusion and will need to Renally Adjust Meds -Continue to Monitor and Trend Renal Function carefully and repeat BMP in the AM  -Creatinine persistently around 2.  Slightly worse today.  Will start on gentle IV fluids. -Magnesium replaced. -Potassium 5.3.  Not on any medicine to increase potassium.  Likely due to poor renal clearance.  Will keep patient on Lokelma every day.  Urine output is adequate.    Anemia of critical illness and iron deficiency and of Chronic Kidney Disease. -Transfuse for Hb < 7; CBC showing improvement -Transfused pRBC's to make 4 units total (transfused 2 units on 12/02/22 and on 12/16/22 and on 12/17/22) -Given IV iron infusion on 6/27 -Checked Anemia Panel and showed an iron level of 24, UIBC 147, TIBC 171, saturation ratios of 14%, ferritin of 647, folate level 10.3, vitamin B12 2427 -Continue to Monitor for S/Sx of Bleeding; No overt bleeding noted -Hemoglobin more than 8.   DM Type 2 poorly controlled with Hyperglycemia. -Continue with resistant NovoLog sliding scale insulin before meals and at bedtime as well as Semglee 40 units twice daily -Blood sugars acceptable today.  BPH. -C/w Tamsulosin 0.4 mg Daily    Chronic neuropathic pain. -Patient was on Lyrica, oxycodone, Lexapro, gabapentin, Seroquel before arriving to the hospital.  He is on chronic pain management.  Discontinue methadone.  Resume home pain management regimen.  Pressure Injuries, poA Pressure Injury 09/05/22 Ankle Anterior;Left Stage 2 -  Partial thickness loss of dermis presenting as a shallow open injury with a red, pink wound bed without slough. full thickness wound, NOT a pressure injury (Active)  09/05/22 2250  Location: Ankle  Location Orientation: Anterior;Left  Staging: Stage 2 -  Partial thickness loss of dermis presenting as a shallow open injury with a red, pink wound bed without slough.  Wound Description (Comments): full thickness wound, NOT a pressure injury  Present on Admission: Yes     Pressure Injury 12/04/22 Buttocks Posterior;Proximal;Right Stage 2 -  Partial thickness loss of dermis presenting as a shallow open injury with a red, pink wound bed without slough. (Active)  12/04/22 0410  Location: Buttocks  Location Orientation: Posterior;Proximal;Right  Staging: Stage 2 -  Partial thickness loss of dermis presenting as a shallow open injury with a red, pink wound bed  without slough.  Wound Description (Comments):   Present on Admission: Yes     Pressure Injury 12/04/22 Thigh Left;Posterior Stage 3 -  Full thickness tissue loss. Subcutaneous fat may be visible but bone, tendon or muscle are NOT exposed. (Active)  12/04/22 0410  Location: Thigh  Location Orientation: Left;Posterior  Staging: Stage 3 -  Full thickness tissue loss. Subcutaneous fat may be visible but bone, tendon or muscle are NOT exposed.  Wound Description (Comments):   Present on Admission: Yes   Hypoalbuminemia -Patient's Albumin Trend: Recent Labs  Lab 12/04/22 0941 12/06/22 0900 12/07/22 1200 12/15/22 1038 12/16/22 0140 12/17/22 0148 12/18/22 0135  ALBUMIN 2.1* <1.5* 2.3* 1.9* 1.6* 1.5* 1.7*  -Continue to Monitor and Trend and repeat CMP in the AM   DVT prophylaxis: heparin injection 5,000 Units Start: 12/08/22 1400    Code Status: Full Code Family Communication: No family present at bedside  Disposition Plan:  Level of care: Progressive,  Status is: Inpatient Remains inpatient appropriate because: Discharge when cleared by surgery. Consultants:  PCCM transfer General surgery  Procedures:  As delineated as above  Antimicrobials:  Anti-infectives (From admission, onward)    Start     Dose/Rate Route Frequency Ordered Stop   12/05/22 1800  vancomycin (VANCOREADY) IVPB 1250 mg/250 mL  Status:  Discontinued        1,250 mg 166.7 mL/hr over 90 Minutes Intravenous Every 48 hours 12/03/22 2346 12/08/22 1148   12/05/22 1300  meropenem (MERREM) 1 g in sodium chloride 0.9 % 100 mL IVPB        1 g 200 mL/hr over 30 Minutes Intravenous Every 12 hours 12/05/22 1201 12/10/22 0700   12/04/22 0100  piperacillin-tazobactam (ZOSYN) IVPB 3.375 g  Status:  Discontinued        3.375 g 12.5 mL/hr over 240 Minutes Intravenous Every 8 hours 12/03/22 2346 12/05/22 1159   12/03/22 1700  vancomycin (VANCOREADY) IVPB 2000 mg/400 mL        2,000 mg 200 mL/hr over 120 Minutes  Intravenous  Once 12/03/22 1657 12/03/22 2020   12/03/22 1700  piperacillin-tazobactam (ZOSYN) IVPB 3.375 g        3.375 g 100 mL/hr over 30 Minutes Intravenous  Once 12/03/22 1657 12/03/22 1804       Subjective:  No overnight events.  He had some abdominal pain yesterday but relieved now.  Eating regular diet.  Objective: Vitals:   12/22/22 0045 12/22/22 0326 12/22/22 0606 12/22/22 0816  BP:  115/62  115/63  Pulse: 61 62  65  Resp: 16 16  16   Temp:  98.4 F (36.9 C)  99.3 F (37.4 C)  TempSrc:  Oral  Oral  SpO2: 93% 94%  93%  Weight:   97.4 kg   Height:        Intake/Output Summary (Last 24 hours) at 12/22/2022 1100 Last data filed at 12/22/2022 0827 Gross per 24 hour  Intake 360 ml  Output 1160 ml  Net -800 ml   Filed Weights   12/20/22 0315 12/21/22 0812 12/22/22 0606  Weight: 99.1 kg 95.8 kg 97.4 kg   Examination: Physical Exam:  General: Chronically sick looking gentleman.  Sleepy in the morning.  Wakes up and denies any complaints. Cardiovascular: S1-S2 normal.  Regular rate rhythm. Respiratory: Bilateral clear.  No added sounds.  On room air. Gastrointestinal: Soft.  Mildly distended.  Bowel sound present.  Mildly tender to palpate.  Colostomy with loose stool.    Large midline incision with dressing intact.  Looks clean. Ext: Right above-knee amputation, left lower extremity with 1+ chronic pedal edema and stasis changes along with transmetatarsal amputation.   Data Reviewed: I have personally reviewed following labs and imaging studies  CBC: Recent Labs  Lab 12/17/22 0148 12/17/22 1633 12/18/22 0135 12/19/22 0047 12/20/22 1143 12/22/22 0120  WBC 12.1*  --  8.0 9.1 10.6* 10.2  NEUTROABS 9.0*  --  5.2 6.2 6.9 6.6  HGB 7.0* 8.5* 10.2* 8.3* 8.1* 8.1*  HCT 24.1* 27.3* 32.9* 26.5* 26.3* 26.0*  MCV 95.3  --  94.3 93.6 91.3 93.2  PLT 492*  --  259 549* 537* 507*   Basic Metabolic Panel: Recent Labs  Lab 12/16/22 0140 12/17/22 0148 12/18/22 0135  12/19/22 0047 12/20/22 0042 12/21/22 0123 12/22/22 0120  NA 137 135 134* 132* 130* 129* 131*  K 5.0 4.5 5.2* 4.9 4.6 5.2* 5.3*  CL 106 106 104 103 102 100 103  CO2 20* 20* 17* 20* 18* 18* 20*  GLUCOSE 156* 131* 149* 161* 137* 137* 116*  BUN 50* 45* 42* 37* 37* 39* 48*  CREATININE 2.08* 2.12* 2.02* 2.01* 2.10* 2.19* 2.34*  CALCIUM 8.1* 8.1* 8.4* 8.3* 8.4* 8.4* 8.6*  MG 1.6* 1.5* 1.7 2.1 1.6*  --   --   PHOS 5.6* 5.7* 5.5* 5.2* 5.1*  --   --    GFR: Estimated Creatinine Clearance: 39.1 mL/min (A) (by C-G formula based on SCr of 2.34 mg/dL (H)). Liver  Function Tests: Recent Labs  Lab 12/16/22 0140 12/17/22 0148 12/18/22 0135 12/19/22 0047  AST 20 17 23 27   ALT 25 21 25 25   ALKPHOS 151* 141* 163* 178*  BILITOT 0.6 0.7 0.4 1.0  PROT 6.2* 5.9* 6.3* 6.2*  ALBUMIN 1.6* 1.5* 1.7* <1.5*   No results for input(s): "LIPASE", "AMYLASE" in the last 168 hours. No results for input(s): "AMMONIA" in the last 168 hours. Coagulation Profile: No results for input(s): "INR", "PROTIME" in the last 168 hours. Cardiac Enzymes: No results for input(s): "CKTOTAL", "CKMB", "CKMBINDEX", "TROPONINI" in the last 168 hours. BNP (last 3 results) No results for input(s): "PROBNP" in the last 8760 hours. HbA1C: No results for input(s): "HGBA1C" in the last 72 hours. CBG: Recent Labs  Lab 12/21/22 0649 12/21/22 1233 12/21/22 1659 12/21/22 2113 12/22/22 0605  GLUCAP 109* 159* 171* 108* 81   Lipid Profile: No results for input(s): "CHOL", "HDL", "LDLCALC", "TRIG", "CHOLHDL", "LDLDIRECT" in the last 72 hours. Thyroid Function Tests: No results for input(s): "TSH", "T4TOTAL", "FREET4", "T3FREE", "THYROIDAB" in the last 72 hours. Anemia Panel: No results for input(s): "VITAMINB12", "FOLATE", "FERRITIN", "TIBC", "IRON", "RETICCTPCT" in the last 72 hours.  Sepsis Labs: No results for input(s): "PROCALCITON", "LATICACIDVEN" in the last 168 hours.  Recent Results (from the past 240 hour(s))   Culture, blood (Routine X 2) w Reflex to ID Panel     Status: None   Collection Time: 12/13/22 11:23 AM   Specimen: BLOOD LEFT HAND  Result Value Ref Range Status   Specimen Description BLOOD LEFT HAND  Final   Special Requests   Final    BOTTLES DRAWN AEROBIC AND ANAEROBIC Blood Culture adequate volume   Culture   Final    NO GROWTH 5 DAYS Performed at [facility], [address], [place], [place] [address]    Report Status 12/18/2022 FINAL  Final  Culture, blood (Routine X 2) w Reflex to ID Panel     Status: None   Collection Time: 12/13/22 11:23 AM   Specimen: BLOOD RIGHT HAND  Result Value Ref Range Status   Specimen Description BLOOD RIGHT HAND  Final   Special Requests   Final    BOTTLES DRAWN AEROBIC AND ANAEROBIC Blood Culture adequate volume   Culture   Final    NO GROWTH 5 DAYS Performed at [facility], [address] [place], [place] [address]    Report Status 12/18/2022 FINAL  Final    Radiology Studies: No results found.  Scheduled Meds:  acetaminophen  650 mg Oral Q6H   amiodarone  200 mg Oral Daily   arformoterol  15 mcg Nebulization BID   aspirin EC  81 mg Oral Daily   atorvastatin  80 mg Oral Daily   budesonide (PULMICORT) nebulizer solution  0.5 mg Nebulization BID   carvedilol  12.5 mg Oral BID WC   Chlorhexidine Gluconate Cloth  6 each Topical Daily   escitalopram  20 mg Oral Daily   gabapentin  200 mg Oral Q8H   guaiFENesin  1,200 mg Oral BID   heparin injection (subcutaneous)  5,000 Units Subcutaneous Q8H   hydrALAZINE  10 mg Oral Q8H   insulin aspart  0-20 Units Subcutaneous TID WC   insulin aspart  0-5 Units Subcutaneous QHS   insulin glargine-yfgn  4 Units Subcutaneous BID   leptospermum manuka honey  1 Application Topical Daily   lidocaine  2 patch Transdermal Daily   loperamide  2 mg Oral TID  methocarbamol  1,000 mg Oral Q8H   multivitamin with minerals  1 tablet Oral Daily   nutrition supplement (JUVEN)  1 packet Oral  BID BM   mouth rinse  15 mL Mouth Rinse TID AC & HS   pantoprazole  40 mg Oral QHS   pregabalin  100 mg Oral BID   QUEtiapine  100 mg Oral QHS   revefenacin  175 mcg Nebulization Daily   sodium bicarbonate  650 mg Oral BID   sodium zirconium cyclosilicate  5 g Oral Daily   tamsulosin  0.4 mg Oral Daily   Continuous Infusions:  sodium chloride Stopped (12/14/22 1901)   sodium chloride      LOS: 19 days   Total time spent: 35 minutes

## 2022-12-22 NOTE — Plan of Care (Signed)
  Problem: Education: Goal: Ability to describe self-care measures that may prevent or decrease complications (Diabetes Survival Skills Education) will improve Outcome: Progressing Goal: Individualized Educational Video(s) Outcome: Progressing   Problem: Coping: Goal: Ability to adjust to condition or change in health will improve Outcome: Progressing   Problem: Fluid Volume: Goal: Ability to maintain a balanced intake and output will improve Outcome: Progressing   Problem: Health Behavior/Discharge Planning: Goal: Ability to identify and utilize available resources and services will improve Outcome: Progressing Goal: Ability to manage health-related needs will improve Outcome: Progressing   Problem: Metabolic: Goal: Ability to maintain appropriate glucose levels will improve Outcome: Progressing   Problem: Nutritional: Goal: Maintenance of adequate nutrition will improve Outcome: Progressing Goal: Progress toward achieving an optimal weight will improve Outcome: Progressing   Problem: Skin Integrity: Goal: Risk for impaired skin integrity will decrease Outcome: Progressing   Problem: Tissue Perfusion: Goal: Adequacy of tissue perfusion will improve Outcome: Progressing   Problem: Education: Goal: Knowledge of General Education information will improve Description: Including pain rating scale, medication(s)/side effects and non-pharmacologic comfort measures Outcome: Progressing   Problem: Health Behavior/Discharge Planning: Goal: Ability to manage health-related needs will improve Outcome: Progressing   Problem: Clinical Measurements: Goal: Ability to maintain clinical measurements within normal limits will improve Outcome: Progressing Goal: Will remain free from infection Outcome: Progressing Goal: Diagnostic test results will improve Outcome: Progressing Goal: Respiratory complications will improve Outcome: Progressing Goal: Cardiovascular complication will  be avoided Outcome: Progressing   Problem: Activity: Goal: Risk for activity intolerance will decrease Outcome: Progressing   Problem: Nutrition: Goal: Adequate nutrition will be maintained Outcome: Progressing   Problem: Coping: Goal: Level of anxiety will decrease Outcome: Progressing   Problem: Elimination: Goal: Will not experience complications related to bowel motility Outcome: Progressing Goal: Will not experience complications related to urinary retention Outcome: Progressing   Problem: Pain Managment: Goal: General experience of comfort will improve Outcome: Progressing   Problem: Safety: Goal: Ability to remain free from injury will improve Outcome: Progressing   Problem: Skin Integrity: Goal: Risk for impaired skin integrity will decrease Outcome: Progressing   Problem: Safety: Goal: Non-violent Restraint(s) Outcome: Progressing   

## 2022-12-22 NOTE — Progress Notes (Signed)
Refused dressing changes of abdomen and left foot and refused assessments of other wounds. Requested to just receive his pain medication and do his dressings at a later time.

## 2022-12-23 DIAGNOSIS — E43 Unspecified severe protein-calorie malnutrition: Secondary | ICD-10-CM | POA: Diagnosis not present

## 2022-12-23 DIAGNOSIS — A419 Sepsis, unspecified organism: Secondary | ICD-10-CM | POA: Diagnosis not present

## 2022-12-23 DIAGNOSIS — R6521 Severe sepsis with septic shock: Secondary | ICD-10-CM | POA: Diagnosis not present

## 2022-12-23 LAB — CBC WITH DIFFERENTIAL/PLATELET
Abs Immature Granulocytes: 0.36 10*3/uL — ABNORMAL HIGH (ref 0.00–0.07)
Basophils Absolute: 0.1 10*3/uL (ref 0.0–0.1)
Basophils Relative: 1 %
Eosinophils Absolute: 0.2 10*3/uL (ref 0.0–0.5)
Eosinophils Relative: 2 %
HCT: 24 % — ABNORMAL LOW (ref 39.0–52.0)
Hemoglobin: 7.4 g/dL — ABNORMAL LOW (ref 13.0–17.0)
Immature Granulocytes: 4 %
Lymphocytes Relative: 16 %
Lymphs Abs: 1.4 10*3/uL (ref 0.7–4.0)
MCH: 28.1 pg (ref 26.0–34.0)
MCHC: 30.8 g/dL (ref 30.0–36.0)
MCV: 91.3 fL (ref 80.0–100.0)
Monocytes Absolute: 1 10*3/uL (ref 0.1–1.0)
Monocytes Relative: 11 %
Neutro Abs: 6 10*3/uL (ref 1.7–7.7)
Neutrophils Relative %: 66 %
Platelets: 504 10*3/uL — ABNORMAL HIGH (ref 150–400)
RBC: 2.63 MIL/uL — ABNORMAL LOW (ref 4.22–5.81)
RDW: 15.5 % (ref 11.5–15.5)
WBC: 9.1 10*3/uL (ref 4.0–10.5)
nRBC: 0 % (ref 0.0–0.2)

## 2022-12-23 LAB — GLUCOSE, CAPILLARY
Glucose-Capillary: 93 mg/dL (ref 70–99)
Glucose-Capillary: 127 mg/dL — ABNORMAL HIGH (ref 70–99)
Glucose-Capillary: 176 mg/dL — ABNORMAL HIGH (ref 70–99)
Glucose-Capillary: 116 mg/dL — ABNORMAL HIGH (ref 70–99)

## 2022-12-23 LAB — BASIC METABOLIC PANEL
Anion gap: 9 (ref 5–15)
BUN: 49 mg/dL — ABNORMAL HIGH (ref 8–23)
CO2: 19 mmol/L — ABNORMAL LOW (ref 22–32)
Calcium: 8.2 mg/dL — ABNORMAL LOW (ref 8.9–10.3)
Chloride: 104 mmol/L (ref 98–111)
Creatinine, Ser: 2.24 mg/dL — ABNORMAL HIGH (ref 0.61–1.24)
GFR, Estimated: 32 mL/min — ABNORMAL LOW (ref >60)
Glucose, Bld: 136 mg/dL — ABNORMAL HIGH (ref 70–99)
Potassium: 4.7 mmol/L (ref 3.5–5.1)
Sodium: 132 mmol/L — ABNORMAL LOW (ref 135–145)

## 2022-12-23 LAB — MAGNESIUM: Magnesium: 1.9 mg/dL (ref 1.7–2.4)

## 2022-12-23 LAB — PHOSPHORUS: Phosphorus: 6.5 mg/dL — ABNORMAL HIGH (ref 2.5–4.6)

## 2022-12-23 MED ORDER — OXYMETAZOLINE HCL 0.05 % NA SOLN
1.0000 | Freq: Two times a day (BID) | NASAL | Status: DC
  Administered 2022-12-23 – 2022-12-25 (×5): 1 via NASAL
  Filled 2022-12-23: qty 30

## 2022-12-23 NOTE — Progress Notes (Signed)
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 On 12/03/2022 he presented to the ED with abdominal pain, urinary incontinence and hypoxia requiring 4 L nasal cannula. His lactic acid level was 3 and he was given fluid bolus and repeat lactic acid was 4. CT was concerning with an ileus with dilatation of the large intestine with stool balls in the distal colon. General surgery was consulted given his rising lactic acid and concern for mesenteric ischemia. He was admitted to the critical care services given his elevated WBC at 28.7. started on IV vancomycin and Zosyn. On 12/04/2022 he had worsening lactic acidosis and the decision was made to take the patient to the OR for exploration and patient was found to have ischemic colon and underwent a subtotal abdominal colectomy and end ileostomy. Postoperative setting he presented to ICU and was intubated and was hypotensive requiring multiple pressors including neo-, epi, Levophed and vasopressin along with a bicarbonate drip. On 12/05/2022 his vasopressor requirements improved and he was extubated.   Remains in the hospital.  Awaiting surgical clearance.   Assessment & Plan:   Septic shock with E. coli UTI and ischemic colitis: Improved.  Off vasopressors.  Off antibiotics.  Acute hypoxic respiratory failure and aspiration pneumonitis: Improved.  On bronchodilator therapy and chest physiotherapy.  On room air now.  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 Does have acute on chronic abdominal pain. Stool output is  adequate. Surgery following.  Paroxysmal A-fib in the setting of acute illness, hypertension, hyperlipidemia, peripheral artery disease: Normal EF Currently in sinus rhythm On amiodarone, carvedilol, hydralazine, aspirin and atorvastatin.  Acute kidney injury, ATN secondary to sepsis with history of CKD stage IIIb: Hyperkalemia. Renal functions fluctuate, however now trending down.  On IV fluid support.  Urine output is adequate.  Continue to monitor.  Potassium is adequately improved on Lokelma.  Anemia of critical illness: Hemoglobin was trending down.  Received total 4 units of PRBC.   Hemoglobin 7.4 today.  Will continue to monitor and transfuse to keep more than 7.  Type 2 diabetes: Poorly controlled.  Now on insulin.  Chronic neuropathic pain, acute abdominal pain: On multiple pain regimen with Lyrica, oxycodone, Lexapro, gabapentin, Seroquel.  Pressure Injury 09/05/22 Ankle Anterior;Left Stage 2 -  Partial thickness loss of dermis presenting as a shallow open injury with a red, pink wound bed without slough. full thickness wound, NOT a pressure injury (Active)  09/05/22 2250  Location: Ankle  Location Orientation: Anterior;Left  Staging: Stage 2 -  Partial thickness loss of dermis presenting as a shallow open injury with a red, pink wound bed without slough.  Wound Description (Comments): full thickness wound, NOT a pressure injury  Present on Admission: Yes     Pressure Injury 12/04/22 Buttocks Posterior;Proximal;Right Stage 2 -  Partial thickness loss of dermis presenting as a shallow open injury with a red, pink wound bed without slough. (Active)  12/04/22 0410  Location: Buttocks  Location Orientation: Posterior;Proximal;Right  Staging: Stage 2 -  Partial thickness loss of dermis presenting as a shallow open injury with  a red, pink wound bed without slough.  Wound Description (Comments):   Present on Admission: Yes     Pressure Injury 12/04/22 Thigh Left;Posterior Stage  3 -  Full thickness tissue loss. Subcutaneous fat may be visible but bone, tendon or muscle are NOT exposed. (Active)  12/04/22 0410  Location: Thigh  Location Orientation: Left;Posterior  Staging: Stage 3 -  Full thickness tissue loss. Subcutaneous fat may be visible but bone, tendon or muscle are NOT exposed.  Wound Description (Comments):   Present on Admission: Yes       DVT prophylaxis: heparin injection 5,000 Units Start: 12/08/22 1400   Code Status: Full code Family Communication: None at the bedside Disposition Plan: Status is: Inpatient Remains inpatient appropriate because: Waiting surgical improvement to go back to long-term nursing care     Consultants:  General surgery Critical care  Procedures:  Multiple procedures, colectomy  Antimicrobials:  Completed   Subjective: Seen and examined.  Sleepy.  Wakes up and tells me that he has leg pain and abdominal pain.  Denies any nausea vomiting.  Is tolerating soft food.  Drinks plenty of ice water and soda.  Objective: Vitals:   12/23/22 0450 12/23/22 0500 12/23/22 0746 12/23/22 0839  BP: 132/66  123/60   Pulse: 64  67   Resp: 17  20   Temp: 98.9 F (37.2 C)  98.1 F (36.7 C)   TempSrc: Axillary  Oral   SpO2: 92%  99% 92%  Weight:  96.5 kg    Height:        Intake/Output Summary (Last 24 hours) at 12/23/2022 1121 Last data filed at 12/23/2022 0751 Gross per 24 hour  Intake 1006.92 ml  Output 1950 ml  Net -943.08 ml   Filed Weights   12/21/22 0812 12/22/22 0606 12/23/22 0500  Weight: 95.8 kg 97.4 kg 96.5 kg    Examination:  General exam: Appears calm.  Chronically sick looking.  Debilitated.  Not in distress.  On room air. Respiratory system: No added sounds.  Cardiovascular system: S1 & S2 heard, RRR. No pedal edema. Gastrointestinal system: Distended but soft.  Mildly tender along the incision lines.  Midline incision with full-thickness open wound dressed.  Right lower quadrant colostomy with loose  stool. Central nervous system: Alert and oriented. No focal neurological deficits. Right above-knee amputation stump clean and dry. Left leg with multiple pressure wounds on the leg, foot is stump clean and dry.   Data Reviewed: I have personally reviewed following labs and imaging studies  CBC: Recent Labs  Lab 12/18/22 0135 12/19/22 0047 12/20/22 1143 12/22/22 0120 12/23/22 0118  WBC 8.0 9.1 10.6* 10.2 9.1  NEUTROABS 5.2 6.2 6.9 6.6 6.0  HGB 10.2* 8.3* 8.1* 8.1* 7.4*  HCT 32.9* 26.5* 26.3* 26.0* 24.0*  MCV 94.3 93.6 91.3 93.2 91.3  PLT 259 549* 537* 507* 504*   Basic Metabolic Panel: Recent Labs  Lab 12/17/22 0148 12/18/22 0135 12/19/22 0047 12/20/22 0042 12/21/22 0123 12/22/22 0120 12/23/22 0118  NA 135 134* 132* 130* 129* 131* 132*  K 4.5 5.2* 4.9 4.6 5.2* 5.3* 4.7  CL 106 104 103 102 100 103 104  CO2 20* 17* 20* 18* 18* 20* 19*  GLUCOSE 131* 149* 161* 137* 137* 116* 136*  BUN 45* 42* 37* 37* 39* 48* 49*  CREATININE 2.12* 2.02* 2.01* 2.10* 2.19* 2.34* 2.24*  CALCIUM 8.1* 8.4* 8.3* 8.4* 8.4* 8.6* 8.2*  MG 1.5* 1.7 2.1 1.6*  --   --  1.9  PHOS  5.7* 5.5* 5.2* 5.1*  --   --  6.5*   GFR: Estimated Creatinine Clearance: 40.7 mL/min (A) (by C-G formula based on SCr of 2.24 mg/dL (H)). Liver Function Tests: Recent Labs  Lab 12/17/22 0148 12/18/22 0135 12/19/22 0047  AST 17 23 27   ALT 21 25 25   ALKPHOS 141* 163* 178*  BILITOT 0.7 0.4 1.0  PROT 5.9* 6.3* 6.2*  ALBUMIN 1.5* 1.7* <1.5*   No results for input(s): "LIPASE", "AMYLASE" in the last 168 hours. No results for input(s): "AMMONIA" in the last 168 hours. Coagulation Profile: No results for input(s): "INR", "PROTIME" in the last 168 hours. Cardiac Enzymes: No results for input(s): "CKTOTAL", "CKMB", "CKMBINDEX", "TROPONINI" in the last 168 hours. BNP (last 3 results) No results for input(s): "PROBNP" in the last 8760 hours. HbA1C: No results for input(s): "HGBA1C" in the last 72 hours. CBG: Recent  Labs  Lab 12/22/22 0605 12/22/22 1239 12/22/22 1747 12/22/22 2117 12/23/22 0659  GLUCAP 81 93 144* 131* 93   Lipid Profile: No results for input(s): "CHOL", "HDL", "LDLCALC", "TRIG", "CHOLHDL", "LDLDIRECT" in the last 72 hours. Thyroid Function Tests: No results for input(s): "TSH", "T4TOTAL", "FREET4", "T3FREE", "THYROIDAB" in the last 72 hours. Anemia Panel: No results for input(s): "VITAMINB12", "FOLATE", "FERRITIN", "TIBC", "IRON", "RETICCTPCT" in the last 72 hours. Sepsis Labs: No results for input(s): "PROCALCITON", "LATICACIDVEN" in the last 168 hours.  Recent Results (from the past 240 hour(s))  Culture, blood (Routine X 2) w Reflex to ID Panel     Status: None   Collection Time: 12/13/22 11:23 AM   Specimen: BLOOD LEFT HAND  Result Value Ref Range Status   Specimen Description BLOOD LEFT HAND  Final   Special Requests   Final    BOTTLES DRAWN AEROBIC AND ANAEROBIC Blood Culture adequate volume   Culture   Final    NO GROWTH 5 DAYS Performed at [facility], [address], [place], [place] [address]    Report Status 12/18/2022 FINAL  Final  Culture, blood (Routine X 2) w Reflex to ID Panel     Status: None   Collection Time: 12/13/22 11:23 AM   Specimen: BLOOD RIGHT HAND  Result Value Ref Range Status   Specimen Description BLOOD RIGHT HAND  Final   Special Requests   Final    BOTTLES DRAWN AEROBIC AND ANAEROBIC Blood Culture adequate volume   Culture   Final    NO GROWTH 5 DAYS Performed at [facility], [address] [place], [place] [address]    Report Status 12/18/2022 FINAL  Final         Radiology Studies: No results found.      Scheduled Meds:  acetaminophen  650 mg Oral Q6H   amiodarone  200 mg Oral Daily   arformoterol  15 mcg Nebulization BID   aspirin EC  81 mg Oral Daily   atorvastatin  80 mg Oral Daily   budesonide (PULMICORT) nebulizer solution  0.5 mg Nebulization BID   carvedilol  12.5 mg Oral BID WC    Chlorhexidine Gluconate Cloth  6 each Topical Daily   escitalopram  20 mg Oral Daily   gabapentin  200 mg Oral Q8H   guaiFENesin  1,200 mg Oral BID   heparin injection (subcutaneous)  5,000 Units Subcutaneous Q8H   hydrALAZINE  10 mg Oral Q8H   insulin aspart  0-20 Units Subcutaneous TID WC   insulin aspart  0-5 Units Subcutaneous QHS   insulin glargine-yfgn  4 Units Subcutaneous BID   lidocaine  2 patch Transdermal Daily   loperamide  2 mg Oral TID   methocarbamol  1,000 mg Oral Q8H   multivitamin with minerals  1 tablet Oral Daily   nutrition supplement (JUVEN)  1 packet Oral BID BM   mouth rinse  15 mL Mouth Rinse TID AC & HS   oxymetazoline  1 spray Each Nare BID   pantoprazole  40 mg Oral QHS   pregabalin  100 mg Oral BID   QUEtiapine  100 mg Oral QHS   revefenacin  175 mcg Nebulization Daily   sodium bicarbonate  650 mg Oral BID   sodium zirconium cyclosilicate  5 g Oral Daily   tamsulosin  0.4 mg Oral Daily   Continuous Infusions:  sodium chloride Stopped (12/14/22 1901)   sodium chloride 75 mL/hr at 12/22/22 1725     LOS: 20 days    Time spent: 35 minutes   Dorcas Carrow, MD [facility]

## 2022-12-24 DIAGNOSIS — R6521 Severe sepsis with septic shock: Secondary | ICD-10-CM | POA: Diagnosis not present

## 2022-12-24 DIAGNOSIS — A419 Sepsis, unspecified organism: Secondary | ICD-10-CM | POA: Diagnosis not present

## 2022-12-24 DIAGNOSIS — E43 Unspecified severe protein-calorie malnutrition: Secondary | ICD-10-CM | POA: Diagnosis not present

## 2022-12-24 LAB — CBC WITH DIFFERENTIAL/PLATELET
Abs Immature Granulocytes: 0.4 10*3/uL — ABNORMAL HIGH (ref 0.00–0.07)
Basophils Absolute: 0.1 10*3/uL (ref 0.0–0.1)
Basophils Relative: 1 %
Eosinophils Absolute: 0.3 10*3/uL (ref 0.0–0.5)
Eosinophils Relative: 3 %
HCT: 24.7 % — ABNORMAL LOW (ref 39.0–52.0)
Hemoglobin: 7.6 g/dL — ABNORMAL LOW (ref 13.0–17.0)
Immature Granulocytes: 4 %
Lymphocytes Relative: 18 %
Lymphs Abs: 1.7 10*3/uL (ref 0.7–4.0)
MCH: 28.5 pg (ref 26.0–34.0)
MCHC: 30.8 g/dL (ref 30.0–36.0)
MCV: 92.5 fL (ref 80.0–100.0)
Monocytes Absolute: 1.2 10*3/uL — ABNORMAL HIGH (ref 0.1–1.0)
Monocytes Relative: 13 %
Neutro Abs: 5.6 10*3/uL (ref 1.7–7.7)
Neutrophils Relative %: 61 %
Platelets: 477 10*3/uL — ABNORMAL HIGH (ref 150–400)
RBC: 2.67 MIL/uL — ABNORMAL LOW (ref 4.22–5.81)
RDW: 15.4 % (ref 11.5–15.5)
WBC: 9.3 10*3/uL (ref 4.0–10.5)
nRBC: 0 % (ref 0.0–0.2)

## 2022-12-24 LAB — GLUCOSE, CAPILLARY
Glucose-Capillary: 127 mg/dL — ABNORMAL HIGH (ref 70–99)
Glucose-Capillary: 127 mg/dL — ABNORMAL HIGH (ref 70–99)
Glucose-Capillary: 103 mg/dL — ABNORMAL HIGH (ref 70–99)
Glucose-Capillary: 108 mg/dL — ABNORMAL HIGH (ref 70–99)
Glucose-Capillary: 146 mg/dL — ABNORMAL HIGH (ref 70–99)

## 2022-12-24 MED ORDER — LOPERAMIDE HCL 2 MG PO CAPS
2.0000 mg | ORAL_CAPSULE | Freq: Two times a day (BID) | ORAL | Status: DC
  Administered 2022-12-24 – 2022-12-25 (×2): 2 mg via ORAL
  Filled 2022-12-24 (×2): qty 1

## 2022-12-24 NOTE — Plan of Care (Signed)
  Problem: Pain Managment: Goal: General experience of comfort will improve Outcome: Progressing   Problem: Safety: Goal: Ability to remain free from injury will improve Outcome: Progressing   Problem: Skin Integrity: Goal: Risk for impaired skin integrity will decrease Outcome: Progressing   

## 2022-12-24 NOTE — Progress Notes (Signed)
PROGRESS NOTE    Gregory [name]  NWG:[id number] DOB: [birth year] DOA: 12/03/2022 PCP: Sherron Monday, MD    Brief Narrative:  62 year old Caucasian male with a past medical history significant for but not limited to COPD on home oxygen, diabetes mellitus type 2 status post right lower extremity AKA as well as other comorbidities who is a SNF resident at [facility] with frequent admissions to hospital. On 12/03/2022 he presented to the ED with abdominal pain, urinary incontinence and hypoxia requiring 4 L nasal cannula. His lactic acid level was 3 and he was given fluid bolus and repeat lactic acid was 4. CT was concerning with an ileus with dilatation of the large intestine with stool balls in the distal colon. General surgery was consulted given his rising lactic acid and concern for mesenteric ischemia. He was admitted to the critical care services given his elevated WBC at 28.7. started on IV vancomycin and Zosyn. On 12/04/2022 he had worsening lactic acidosis and the decision was made to take the patient to the OR for exploration and patient was found to have ischemic colon and underwent a subtotal abdominal colectomy and end ileostomy. Postoperative setting he presented to ICU and was intubated and was hypotensive requiring multiple pressors including neo-, epi, Levophed and vasopressin along with a bicarbonate drip. On 12/05/2022 his vasopressor requirements improved and he was extubated.   Remains in the hospital.  Persistent abdominal pain and poor appetite.   Assessment & Plan:   Septic shock with E. coli UTI and ischemic colitis: Improved.  Off vasopressors.  Off antibiotics.  Acute hypoxic respiratory failure and aspiration pneumonitis: Improved.  On bronchodilator therapy and chest physiotherapy.  On room air now.  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 Does have acute on chronic abdominal  pain. Stool output is adequate. Surgery following. On Imodium and pain management. Surgically stable as per surgery.  Paroxysmal A-fib in the setting of acute illness, hypertension, hyperlipidemia, peripheral artery disease: Normal EF Currently in sinus rhythm On amiodarone, carvedilol, hydralazine, aspirin and atorvastatin.  Acute kidney injury, ATN secondary to sepsis with history of CKD stage IIIb: Hyperkalemia. Renal functions fluctuate, however now trending down.  On IV fluid support.  Urine output is adequate.  Continue to monitor.  Potassium is adequately improved on Lokelma. Will stop IV fluid today and monitor renal functions, if able to adequately keep up with renal functions, plan to discharge back to nursing home tomorrow.  Anemia of critical illness: Hemoglobin was trending down.  Received total 4 units of PRBC.   Hemoglobin 7.5 today.  Will continue to monitor and transfuse to keep more than 7.  Type 2 diabetes: Poorly controlled.  Now on insulin.  Chronic neuropathic pain, acute abdominal pain: On multiple pain regimen with Lyrica, oxycodone, Lexapro, gabapentin, Seroquel.  Pressure Injury 09/05/22 Ankle Anterior;Left Stage 2 -  Partial thickness loss of dermis presenting as a shallow open injury with a red, pink wound bed without slough. full thickness wound, NOT a pressure injury (Active)  09/05/22 2250  Location: Ankle  Location Orientation: Anterior;Left  Staging: Stage 2 -  Partial thickness loss of dermis presenting as a shallow open injury with a red, pink wound bed without slough.  Wound Description (Comments): full thickness wound, NOT a pressure injury  Present on Admission: Yes     Pressure Injury 12/04/22 Buttocks Posterior;Proximal;Right Stage 2 -  Partial thickness loss of dermis presenting as a shallow  open injury with a red, pink wound bed without slough. (Active)  12/04/22 0410  Location: Buttocks  Location Orientation: Posterior;Proximal;Right   Staging: Stage 2 -  Partial thickness loss of dermis presenting as a shallow open injury with a red, pink wound bed without slough.  Wound Description (Comments):   Present on Admission: Yes     Pressure Injury 12/04/22 Thigh Left;Posterior Stage 3 -  Full thickness tissue loss. Subcutaneous fat may be visible but bone, tendon or muscle are NOT exposed. (Active)  12/04/22 0410  Location: Thigh  Location Orientation: Left;Posterior  Staging: Stage 3 -  Full thickness tissue loss. Subcutaneous fat may be visible but bone, tendon or muscle are NOT exposed.  Wound Description (Comments):   Present on Admission: Yes       DVT prophylaxis: heparin injection 5,000 Units Start: 12/08/22 1400   Code Status: Full code Family Communication: None at the bedside Disposition Plan: Status is: Inpatient Remains inpatient appropriate because: Waiting adequate oral intake.   Consultants:  General surgery Critical care  Procedures:  Multiple procedures, colectomy  Antimicrobials:  Completed   Subjective: Patient seen and examined.  Continues to complain of abdominal pain mostly at the incision site and left lower quadrant.  Case discussed with surgery.  Patient examined by surgery.  He does have poor appetite but he has normal bowel function.  Colostomy output is adequate.  Still on normal saline and urine is clearing up.  Will stop IV fluids and monitor his renal functions tomorrow.  Objective: Vitals:   12/24/22 0030 12/24/22 0500 12/24/22 0525 12/24/22 0752  BP: 112/61  132/64 126/63  Pulse: (!) 58  66 62  Resp: 16  17 20   Temp: 98.1 F (36.7 C)  98.4 F (36.9 C) 97.9 F (36.6 C)  TempSrc: Axillary  Oral Oral  SpO2: 100%  99% 99%  Weight:  96.6 kg    Height:        Intake/Output Summary (Last 24 hours) at 12/24/2022 1125 Last data filed at 12/24/2022 1058 Gross per 24 hour  Intake 2701.7 ml  Output 2900 ml  Net -198.3 ml    Filed Weights   12/22/22 0606 12/23/22 0500  12/24/22 0500  Weight: 97.4 kg 96.5 kg 96.6 kg    Examination:  General exam: Appears calm.  Chronically sick looking.  Debilitated.  Not in distress.  On 1 to 2 days of oxygen. Respiratory system: No added sounds.  Cardiovascular system: S1 & S2 heard, RRR. No pedal edema. Gastrointestinal system: Distended but soft.  Mildly tender along the incision lines.  Midline incision with full-thickness open wound dressed.  Pictures in the chart.  Right lower quadrant colostomy with loose stool. Central nervous system: Alert and oriented. No focal neurological deficits. Right above-knee amputation stump clean and dry. Left leg with multiple pressure wounds on the leg, foot is stump clean and dry.   Data Reviewed: I have personally reviewed following labs and imaging studies  CBC: Recent Labs  Lab 12/19/22 0047 12/20/22 1143 12/22/22 0120 12/23/22 0118 12/24/22 0121  WBC 9.1 10.6* 10.2 9.1 9.3  NEUTROABS 6.2 6.9 6.6 6.0 5.6  HGB 8.3* 8.1* 8.1* 7.4* 7.6*  HCT 26.5* 26.3* 26.0* 24.0* 24.7*  MCV 93.6 91.3 93.2 91.3 92.5  PLT 549* 537* 507* 504* 477*    Basic Metabolic Panel: Recent Labs  Lab 12/18/22 0135 12/19/22 0047 12/20/22 0042 12/21/22 0123 12/22/22 0120 12/23/22 0118  NA 134* 132* 130* 129* 131* 132*  K 5.2* 4.9  4.6 5.2* 5.3* 4.7  CL 104 103 102 100 103 104  CO2 17* 20* 18* 18* 20* 19*  GLUCOSE 149* 161* 137* 137* 116* 136*  BUN 42* 37* 37* 39* 48* 49*  CREATININE 2.02* 2.01* 2.10* 2.19* 2.34* 2.24*  CALCIUM 8.4* 8.3* 8.4* 8.4* 8.6* 8.2*  MG 1.7 2.1 1.6*  --   --  1.9  PHOS 5.5* 5.2* 5.1*  --   --  6.5*    GFR: Estimated Creatinine Clearance: 40.7 mL/min (A) (by C-G formula based on SCr of 2.24 mg/dL (H)). Liver Function Tests: Recent Labs  Lab 12/18/22 0135 12/19/22 0047  AST 23 27  ALT 25 25  ALKPHOS 163* 178*  BILITOT 0.4 1.0  PROT 6.3* 6.2*  ALBUMIN 1.7* <1.5*    No results for input(s): "LIPASE", "AMYLASE" in the last 168 hours. No results for  input(s): "AMMONIA" in the last 168 hours. Coagulation Profile: No results for input(s): "INR", "PROTIME" in the last 168 hours. Cardiac Enzymes: No results for input(s): "CKTOTAL", "CKMB", "CKMBINDEX", "TROPONINI" in the last 168 hours. BNP (last 3 results) No results for input(s): "PROBNP" in the last 8760 hours. HbA1C: No results for input(s): "HGBA1C" in the last 72 hours. CBG: Recent Labs  Lab 12/23/22 1233 12/23/22 1624 12/23/22 2102 12/24/22 0536 12/24/22 0811  GLUCAP 127* 176* 116* 127* 127*    Lipid Profile: No results for input(s): "CHOL", "HDL", "LDLCALC", "TRIG", "CHOLHDL", "LDLDIRECT" in the last 72 hours. Thyroid Function Tests: No results for input(s): "TSH", "T4TOTAL", "FREET4", "T3FREE", "THYROIDAB" in the last 72 hours. Anemia Panel: No results for input(s): "VITAMINB12", "FOLATE", "FERRITIN", "TIBC", "IRON", "RETICCTPCT" in the last 72 hours. Sepsis Labs: No results for input(s): "PROCALCITON", "LATICACIDVEN" in the last 168 hours.  No results found for this or any previous visit (from the past 240 hour(s)).        Radiology Studies: No results found.      Scheduled Meds:  acetaminophen  650 mg Oral Q6H   amiodarone  200 mg Oral Daily   arformoterol  15 mcg Nebulization BID   aspirin EC  81 mg Oral Daily   atorvastatin  80 mg Oral Daily   budesonide (PULMICORT) nebulizer solution  0.5 mg Nebulization BID   carvedilol  12.5 mg Oral BID WC   Chlorhexidine Gluconate Cloth  6 each Topical Daily   escitalopram  20 mg Oral Daily   gabapentin  200 mg Oral Q8H   guaiFENesin  1,200 mg Oral BID   heparin injection (subcutaneous)  5,000 Units Subcutaneous Q8H   hydrALAZINE  10 mg Oral Q8H   insulin aspart  0-20 Units Subcutaneous TID WC   insulin aspart  0-5 Units Subcutaneous QHS   insulin glargine-yfgn  4 Units Subcutaneous BID   lidocaine  2 patch Transdermal Daily   loperamide  2 mg Oral BID   methocarbamol  1,000 mg Oral Q8H   multivitamin  with minerals  1 tablet Oral Daily   nutrition supplement (JUVEN)  1 packet Oral BID BM   mouth rinse  15 mL Mouth Rinse TID AC & HS   oxymetazoline  1 spray Each Nare BID   pantoprazole  40 mg Oral QHS   pregabalin  100 mg Oral BID   QUEtiapine  100 mg Oral QHS   revefenacin  175 mcg Nebulization Daily   sodium bicarbonate  650 mg Oral BID   sodium zirconium cyclosilicate  5 g Oral Daily   tamsulosin  0.4 mg Oral Daily  Continuous Infusions:  sodium chloride Stopped (12/14/22 1901)     LOS: 21 days    Time spent: 35 minutes   Dorcas Carrow, MD [facility]

## 2022-12-24 NOTE — Progress Notes (Signed)
PT Cancellation Note  Patient Details Name: Gregory [name] MRN: [id number] DOB: 1960-06-23   Cancelled Treatment:    Reason Eval/Treat Not Completed: Patient declined, no reason specified Attempted to see pt for PT treatment. Pt received in bed requesting something for heartburn - nurse notified. PT offered to assist pt with sitting EOB, repositioning in bed, or bed level exercises but pt declined despite PT education & efforts. Will f/u as able.  Aleda Grana, PT, DPT 12/24/22, 12:18 PM   Sandi Mariscal 12/24/2022, 12:17 PM

## 2022-12-24 NOTE — Progress Notes (Addendum)
21 Days Post-Op   Subjective/Chief Complaint:    Objective:  Describes abdominal pain as stable. Points to incision when I ask where it hurts. He is not eating well and reports poor appetite. Denies nausea or vomiting.  Ostomy - 250 mL + 1 undocumented  Vital signs in last 24 hours: Temp:  [97.9 F (36.6 C)-98.4 F (36.9 C)] 97.9 F (36.6 C) (07/08 0752) Pulse Rate:  [56-70] 62 (07/08 0752) Resp:  [16-20] 20 (07/08 0752) BP: (109-132)/(61-101) 126/63 (07/08 0752) SpO2:  [91 %-100 %] 99 % (07/08 0752) Weight:  [96.6 kg] 96.6 kg (07/08 0500) Last BM Date : (P) 12/23/22  Intake/Output from previous day: 07/07 0701 - 07/08 0700 In: 240 [P.O.:240] Out: 2550 [Urine:2300; Stool:250] Intake/Output this shift: No intake/output data recorded.  Gen; Alert, chronically ill appearing, NAD Pulm: normal work of breathing on  Abd: soft, obese, midline invisions c/d/I with minimal fibrinous exudate central portion of wound  Stoma in RUQ is pink, viable, edematous - gas + semi-solid stool in ostomy pouch MSK: edematous LLE with chronic skin chanes, R AKA.  Lab Results:  Recent Labs    12/23/22 0118 12/24/22 0121  WBC 9.1 9.3  HGB 7.4* 7.6*  HCT 24.0* 24.7*  PLT 504* 477*   BMET Recent Labs    12/22/22 0120 12/23/22 0118  NA 131* 132*  K 5.3* 4.7  CL 103 104  CO2 20* 19*  GLUCOSE 116* 136*  BUN 48* 49*  CREATININE 2.34* 2.24*  CALCIUM 8.6* 8.2*   PT/INR No results for input(s): "LABPROT", "INR" in the last 72 hours. ABG No results for input(s): "PHART", "HCO3" in the last 72 hours.  Invalid input(s): "PCO2", "PO2"   Studies/Results: No results found.  Anti-infectives: Anti-infectives (From admission, onward)    Start     Dose/Rate Route Frequency Ordered Stop   12/05/22 1800  vancomycin (VANCOREADY) IVPB 1250 mg/250 mL  Status:  Discontinued        1,250 mg 166.7 mL/hr over 90 Minutes Intravenous Every 48 hours 12/03/22 2346 12/08/22 1148   12/05/22 1300   meropenem (MERREM) 1 g in sodium chloride 0.9 % 100 mL IVPB        1 g 200 mL/hr over 30 Minutes Intravenous Every 12 hours 12/05/22 1201 12/10/22 0700   12/04/22 0100  piperacillin-tazobactam (ZOSYN) IVPB 3.375 g  Status:  Discontinued        3.375 g 12.5 mL/hr over 240 Minutes Intravenous Every 8 hours 12/03/22 2346 12/05/22 1159   12/03/22 1700  vancomycin (VANCOREADY) IVPB 2000 mg/400 mL        2,000 mg 200 mL/hr over 120 Minutes Intravenous  Once 12/03/22 1657 12/03/22 2020   12/03/22 1700  piperacillin-tazobactam (ZOSYN) IVPB 3.375 g        3.375 g 100 mL/hr over 30 Minutes Intravenous  Once 12/03/22 1657 12/03/22 1804       Assessment/Plan: 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 Sheliah Hatch 12/04/22 -NS WD dressing changes to midline wound BID -completed abx therapy for abd and for ESBL in urine -tol SOFT diet -JP drain removed last week -WBC 9.3 -scheduled Tylenol and Robaxin to help with non-narcotic pain control.   -WOC following for new ileostomy -imodium to BID, continue fiber,  Stable for discharage to SNF from [facility] standpoint, continue fiber/imodium upon discharge. I ordered ostomy clinic referral   FEN - Soft VTE - SQH ID - vanc/Merrem, completed   Septic shock - resolved VDRF - per  primary MMP - per primary  LOS: 21 days    Adam Phenix [name] 12/24/2022

## 2022-12-25 LAB — GLUCOSE, CAPILLARY
Glucose-Capillary: 98 mg/dL (ref 70–99)
Glucose-Capillary: 96 mg/dL (ref 70–99)

## 2022-12-25 MED ORDER — CARVEDILOL 12.5 MG PO TABS: 12.5000 mg | ORAL_TABLET | Freq: Two times a day (BID) | ORAL | Status: DC

## 2022-12-25 MED ORDER — OXYCODONE HCL 10 MG PO TABS: 10.0000 mg | ORAL_TABLET | ORAL | 0 refills | Status: AC | PRN

## 2022-12-25 MED ORDER — PREGABALIN 100 MG PO CAPS: 100.0000 mg | ORAL_CAPSULE | Freq: Two times a day (BID) | ORAL | 0 refills | Status: DC

## 2022-12-25 MED ORDER — GABAPENTIN 100 MG PO CAPS: 200.0000 mg | ORAL_CAPSULE | Freq: Three times a day (TID) | ORAL | 0 refills | Status: DC

## 2022-12-25 MED ORDER — METHOCARBAMOL 1000 MG PO TABS: 1000.0000 mg | ORAL_TABLET | Freq: Three times a day (TID) | ORAL | 0 refills | Status: AC

## 2022-12-25 MED ORDER — HYDRALAZINE HCL 10 MG PO TABS: 10.0000 mg | ORAL_TABLET | Freq: Three times a day (TID) | ORAL | Status: DC

## 2022-12-25 MED ORDER — LOPERAMIDE HCL 2 MG PO CAPS: 2.0000 mg | ORAL_CAPSULE | Freq: Two times a day (BID) | ORAL | 0 refills | Status: DC

## 2022-12-25 MED ORDER — ADULT MULTIVITAMIN W/MINERALS CH: 1.0000 | ORAL_TABLET | Freq: Every day | ORAL | Status: DC

## 2022-12-25 MED ORDER — MORPHINE SULFATE (PF) 2 MG/ML IV SOLN
2.0000 mg | Freq: Once | INTRAVENOUS | Status: AC
  Administered 2022-12-25: 2 mg via INTRAVENOUS
  Filled 2022-12-25: qty 1

## 2022-12-25 MED ORDER — LIDOCAINE 5 % EX PTCH: 2.0000 | MEDICATED_PATCH | Freq: Every day | CUTANEOUS | 0 refills | Status: DC

## 2022-12-25 MED ORDER — SODIUM BICARBONATE 650 MG PO TABS: 650.0000 mg | ORAL_TABLET | Freq: Two times a day (BID) | ORAL | 0 refills | Status: AC

## 2022-12-25 NOTE — Progress Notes (Signed)
Report given to [facility]. All questions answered. Patients colostomy supplies packed up and taken with patient. PIV removed. Telemetry removed, CCMD notified. Patient transported by [facility].  Kenard Gower, RN

## 2022-12-25 NOTE — TOC Transition Note (Signed)
Transition of Care [facility]) - CM/SW Discharge Note   Patient Details  Name: Gregory [name] MRN: [id number] Date of Birth: March 20, 1961  [facility] CM/SW Contact:  [name], LCSW Phone Number: 12/25/2022, 2:13 PM   Clinical Narrative:     Patient will Discharge to: [name]  Discharge Date: 12/25/2022 Family Notified: daughter- no answer  Transport By: Sharin Mons  Per MD patient is ready for discharge. RN, patient, and facility notified of discharge. Discharge Summary sent to facility. RN given number for report [phone]. Ambulance transport requested for patient.   Clinical Social Worker signing off.  Antony Blackbird, MSW, LCSW Clinical Social Worker    Final next level of care: Skilled Nursing Facility Barriers to Discharge: Barriers Resolved   Patient Goals and CMS Choice      Discharge Placement                         Discharge Plan and Services Additional resources added to the After Visit Summary for   In-house Referral: Clinical Social Work                                   Social Determinants of Health (SDOH) Interventions SDOH Screenings   Food Insecurity: No Food Insecurity (09/18/2022)  Housing: Low Risk  (09/18/2022)  Transportation Needs: No Transportation Needs (09/18/2022)  Utilities: Not At Risk (09/18/2022)  Depression (PHQ2-9): Low Risk  (03/08/2021)  Tobacco Use: High Risk (12/04/2022)     Readmission Risk Interventions    09/06/2022    1:48 PM 08/27/2022   12:46 PM 05/03/2022   11:53 AM  Readmission Risk Prevention Plan  Transportation Screening Complete Complete Complete  Medication Review Oceanographer) Complete Complete Complete  PCP or Specialist appointment within 3-5 days of discharge Complete Complete Complete  HRI or Home Care Consult Complete Complete Complete  SW Recovery Care/Counseling Consult Complete Complete Complete  Palliative Care Screening Not Applicable Not Applicable Not Applicable   Skilled Nursing Facility Complete Complete Complete

## 2022-12-25 NOTE — Progress Notes (Signed)
PT Cancellation Note  Patient Details Name: Gregory [name] MRN: [id number] DOB: 31-May-1961   Cancelled Treatment:    Reason Eval/Treat Not Completed: (P) Pain limiting ability to participate (Pt stating "i feel awful" and declining all mobility. Will check back as schedule allows.)   [name] 12/25/2022, 10:16 AM

## 2022-12-25 NOTE — Discharge Summary (Signed)
Physician Discharge Summary  Gregory [name] ZOX:[id number] DOB: 1960/06/22 DOA: 12/03/2022  PCP: Sherron Monday, MD  Admit date: 12/03/2022 Discharge date: 12/25/2022  Admitted From: long term NH  Disposition: long term care  Recommendations for Outpatient Follow-up:  Follow up with PCP in 1-2 weeks Please obtain BMP/CBC in one week  Discharge Condition: Stable CODE STATUS: Full code Diet recommendation: Soft diet  Discharge summary: 62 year old Caucasian male with a past medical history significant for COPD on home oxygen, diabetes mellitus type 2 status post right lower extremity AKA as well as other comorbidities who is a SNF resident at [facility] with frequent admissions to hospital. On 12/03/2022 he presented to the ED with abdominal pain, urinary incontinence and hypoxia requiring 4 L nasal cannula. His lactic acid level was 3 and he was given fluid bolus and repeat lactic acid was 4. CT was concerning with an ileus with dilatation of the large intestine with stool balls in the distal colon. General surgery was consulted given his rising lactic acid and concern for mesenteric ischemia. He was admitted to the critical care services given his elevated WBC at 28.7. started on IV vancomycin and Zosyn. On 12/04/2022 he had worsening lactic acidosis and the decision was made to take the patient to the OR for exploration and patient was found to have ischemic colon and underwent a subtotal abdominal colectomy and end ileostomy. Postoperative setting he presented to ICU and was intubated and was hypotensive requiring multiple pressors including neo-, epi, Levophed and vasopressin along with a bicarbonate drip. On 12/05/2022 his vasopressor requirements improved and he was extubated.    Patient remained in the hospital due to persistent abdominal pain, poor oral intake that has improved now.  Patient does have chronic abdominal pain and now exacerbated due to surgical incision.  Will manage with oral  pain medications.     Assessment & plan of care:   Septic shock with E. coli UTI and ischemic colitis: Improved.  Off vasopressors.  Off antibiotics.   Acute hypoxic respiratory failure and aspiration pneumonitis: Improved.  On bronchodilator therapy and chest physiotherapy.  On room air now.  Occasionally using oxygen.  Can use oxygen to keep saturations more than 90% as needed.   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 Does have acute on chronic abdominal pain. Stool output is adequate. Surgery has been following. On Imodium and pain management.  Pain management as below. Surgically stable as per surgery.   Paroxysmal A-fib in the setting of acute illness, hypertension, hyperlipidemia, peripheral artery disease: Normal EF Currently in sinus rhythm On amiodarone, carvedilol, hydralazine, aspirin and atorvastatin.  He has remained in sinus rhythm and already received 3 weeks of amiodarone.  Will discontinue amiodarone.   Acute kidney injury, ATN secondary to sepsis with history of CKD stage IIIb: Hyperkalemia. Renal functions fluctuate, however now trending down and at about his baseline. Monitored with IV fluid support.  Potassium is adequately controlled.  Encourage oral nutrition.  Recheck renal functions in 1 week.    Anemia of critical illness: Hemoglobin was trending down.  Received total 4 units of PRBC.   Hemoglobin 7.6 today.  Will continue to monitor and transfuse to keep more than 7.   Type 2 diabetes: Poorly controlled.  Now on insulin.   Chronic neuropathic pain, acute abdominal pain: On multiple pain regimen with Lyrica, oxycodone, Lexapro, gabapentin, Seroquel.  Prescribed.  Chronically ill but currently surgically stable.  Transition to long-term care.  Wound care instructions attached.  Discharge Diagnoses:  Principal Problem:   Sepsis (HCC) Active Problems:   Protein-calorie  malnutrition, severe    Discharge Instructions  Discharge Instructions     Amb Referral to Ostomy Clinic   Complete by: As directed    Reason for referral modifiers:  Pre and post-operative counseling for ostomy management Recommendations for issues such as peristomal hernias or stoma prolapse     Diet - low sodium heart healthy   Complete by: As directed    Discharge wound care:   Complete by: As directed    Wound care  Every shift      Comments: NS wet to dry dressing changes to midline wound BID Wound care  Daily      Comments: Wound care to wounds at left buttocks, left foot, left knee, and left upper LE: cleanse with soap and water, rinse and pat dry. Apply MediHoney, top with dry gauze and secure with silicone foam. Change daily.   Increase activity slowly   Complete by: As directed       Allergies as of 12/25/2022       Reactions   Latex Other (See Comments)   "ALLERGIC," per [facility]        Medication List     STOP taking these medications    docusate sodium 100 MG capsule Commonly known as: COLACE   fenofibrate 54 MG tablet   lactulose 10 GM/15ML solution Commonly known as: CHRONULAC   metoprolol tartrate 25 MG tablet Commonly known as: LOPRESSOR   phenazopyridine 100 MG tablet Commonly known as: PYRIDIUM   polyethylene glycol 17 g packet Commonly known as: MIRALAX / GLYCOLAX   prochlorperazine 10 MG tablet Commonly known as: COMPAZINE   sennosides-docusate sodium 8.6-50 MG tablet Commonly known as: SENOKOT-S       TAKE these medications    aspirin EC 81 MG tablet Take 81 mg by mouth daily. Swallow whole.   atorvastatin 80 MG tablet Commonly known as: LIPITOR Take 1 tablet (80 mg total) by mouth daily.   carvedilol 12.5 MG tablet Commonly known as: COREG Take 1 tablet (12.5 mg total) by mouth 2 (two) times daily with a meal.   cetirizine 10 MG tablet Commonly known as: ZYRTEC Take 10 mg by mouth daily.   Cholecalciferol 25 MCG (1000  UT) capsule Take 1,000 Units by mouth daily.   Cranberry 450 MG Caps Take 450 mg by mouth daily.   cyanocobalamin 1000 MCG tablet Take 1,000 mcg by mouth daily.   escitalopram 20 MG tablet Commonly known as: LEXAPRO Take 20 mg by mouth daily.   ferrous sulfate 325 (65 FE) MG tablet Take 1 tablet (325 mg total) by mouth daily with breakfast.   fluticasone 50 MCG/ACT nasal spray Commonly known as: FLONASE Place 1 spray into both nostrils in the morning.   furosemide 40 MG tablet Commonly known as: LASIX Take 40 mg by mouth daily.   gabapentin 100 MG capsule Commonly known as: NEURONTIN Take 2 capsules (200 mg total) by mouth every 8 (eight) hours.   hydrALAZINE 10 MG tablet Commonly known as: APRESOLINE Take 1 tablet (10 mg total) by mouth every 8 (eight) hours.   hydrOXYzine 25 MG tablet Commonly known as: ATARAX Take 25 mg by mouth every 6 (six) hours as needed for anxiety.   hydrOXYzine 50 MG capsule Commonly known as: VISTARIL Take 50 mg by mouth at bedtime.   insulin lispro 100 UNIT/ML KwikPen  Commonly known as: HUMALOG Inject 0-10 Units into the skin See admin instructions. Inject 0-10 units into the skin three times a day and at bedtime, PER SLIDING SCALE:  BGL 151-200 = 2 units 201-250 = 4 units 251-300 = 6 units 301-350 =  8 units 351-400 = 10 units What changed: additional instructions   ipratropium-albuterol 0.5-2.5 (3) MG/3ML Soln Commonly known as: DUONEB Take 3 mLs by nebulization every 6 (six) hours as needed (for shortness of breath).   Lantus SoloStar 100 UNIT/ML Solostar Pen Generic drug: insulin glargine Inject 15 Units into the skin daily. What changed:  how much to take when to take this   lidocaine 5 % Commonly known as: LIDODERM Place 2 patches onto the skin daily. Remove & Discard patch within 12 hours or as directed by MD   loperamide 2 MG capsule Commonly known as: IMODIUM Take 1 capsule (2 mg total) by mouth 2 (two) times  daily.   magnesium oxide 400 (240 Mg) MG tablet Commonly known as: MAG-OX Take 400 mg by mouth 2 (two) times daily.   Melatonin 10 MG Tabs Take 10 mg by mouth at bedtime.   Methocarbamol 1000 MG Tabs Take 1,000 mg by mouth every 8 (eight) hours for 7 days.   mirabegron ER 50 MG Tb24 tablet Commonly known as: MYRBETRIQ Take 50 mg by mouth daily.   multivitamin with minerals Tabs tablet Take 1 tablet by mouth daily.   Narcan 4 MG/0.1ML Liqd nasal spray kit Generic drug: naloxone Place 1 spray into the nose as needed (poorly responsive or turning blue).   Natural Balance Tears 0.1-0.3 % Soln Generic drug: Dextran 70-Hypromellose Apply 1 drop to eye every 4 (four) hours as needed (dry eyes).   omeprazole 20 MG capsule Commonly known as: PRILOSEC Take 20 mg by mouth at bedtime.   ondansetron 4 MG tablet Commonly known as: ZOFRAN Take 4 mg by mouth every 8 (eight) hours as needed for nausea.   Oxycodone HCl 10 MG Tabs Take 1 tablet (10 mg total) by mouth every 4 (four) hours as needed for up to 5 days for severe pain or moderate pain. What changed:  medication strength how much to take when to take this reasons to take this   oxymetazoline 0.05 % nasal spray Commonly known as: AFRIN Place 1 spray into both nostrils 2 (two) times daily as needed for congestion.   pregabalin 100 MG capsule Commonly known as: LYRICA Take 1 capsule (100 mg total) by mouth 2 (two) times daily.   QUEtiapine 100 MG tablet Commonly known as: SEROQUEL Take 100 mg by mouth at bedtime.   sodium bicarbonate 650 MG tablet Take 1 tablet (650 mg total) by mouth 2 (two) times daily.   tamsulosin 0.4 MG Caps capsule Commonly known as: FLOMAX Take 0.4 mg by mouth daily.   trolamine salicylate 10 % cream Commonly known as: ASPERCREME Apply 1 Application topically every 6 (six) hours as needed for muscle pain. Right leg   Trulicity 0.75 MG/0.5ML Sopn Generic drug: Dulaglutide Inject 0.75 mg  into the skin every Thursday.   Uro-MP 118 MG Caps Take 118 mg by mouth 2 (two) times daily.               Discharge Care Instructions  (From admission, onward)           Start     Ordered   12/25/22 0000  Discharge wound care:       Comments: Wound care  Every shift      Comments: NS wet to dry dressing changes to midline wound BID Wound care  Daily      Comments: Wound care to wounds at left buttocks, left foot, left knee, and left upper LE: cleanse with soap and water, rinse and pat dry. Apply MediHoney, top with dry gauze and secure with silicone foam. Change daily.   12/25/22 0935            Follow-up Information     Kinsinger, De Blanch, MD. Schedule an appointment as soon as possible for a visit in 4 week(s).   Specialty: General Surgery Why: For post-operative follow up Contact information: [address] [place] [address] [phone]                Allergies  Allergen Reactions   Latex Other (See Comments)    "ALLERGIC," per [facility]    Consultations: General surgery Critical care   Procedures/Studies: DG CHEST PORT 1 VIEW  Result Date: 12/19/2022 CLINICAL DATA:  Shortness of breath on exertion.  Sepsis. EXAM: PORTABLE CHEST 1 VIEW COMPARISON:  Radiographs 12/18/2022 and 12/17/2022.  CT 12/03/2022. FINDINGS: 0624 hours. Lordotic positioning. The heart size and mediastinal contours are stable. The aeration of the perihilar regions of both lungs appears mildly improved. Patchy left basilar opacity appears slightly increased. No new airspace disease on the right, pneumothorax or significant pleural effusion. The bones appear unremarkable. Telemetry leads overlie the chest. IMPRESSION: Mildly improved aeration of the perihilar regions of both lungs. Slightly increased patchy left basilar opacity, potentially atelectasis or early infiltrate. Electronically Signed   By: Carey Bullocks M.D.   On: 12/19/2022 09:37   DG CHEST PORT 1  VIEW  Result Date: 12/18/2022 CLINICAL DATA:  Sepsis, shortness of breath. EXAM: PORTABLE CHEST 1 VIEW COMPARISON:  Chest radiograph 1 day prior FINDINGS: The cardiomediastinal silhouette is stable. Patchy opacities in both suprahilar regions are again noted, slightly improved on the right. There is no new or worsening focal airspace opacity. There is no overt pulmonary edema. There is no pleural effusion or pneumothorax There is no acute osseous abnormality. IMPRESSION: Patchy opacities in both suprahilar regions, improved on the right. New or worsening focal airspace disease. Electronically Signed   By: Lesia Hausen M.D.   On: 12/18/2022 10:10   DG CHEST PORT 1 VIEW  Result Date: 12/17/2022 CLINICAL DATA:  Sepsis and shortness of breath EXAM: PORTABLE CHEST 1 VIEW COMPARISON:  Chest radiograph dated 12/16/2022 FINDINGS: Lines/tubes: None. Lungs: Persistent left retrocardiac patchy opacities. New right mid lung patchy opacity. Pleura: No pneumothorax or pleural effusion. Heart/mediastinum: The heart size and mediastinal contours are within normal limits. Bones: Cervical spinal fixation hardware appears intact. IMPRESSION: Persistent left retrocardiac patchy opacities and new right mid lung patchy opacity, likely atelectasis. Aspiration or pneumonia can be considered in the appropriate clinical setting. Electronically Signed   By: Agustin Cree M.D.   On: 12/17/2022 09:04   DG CHEST PORT 1 VIEW  Result Date: 12/16/2022 CLINICAL DATA:  Shortness of breath. EXAM: PORTABLE CHEST 1 VIEW COMPARISON:  12/13/2022 FINDINGS: Lordotic positioning noted. Heart size is within normal limits. Low lung volumes noted. Interstitial infiltrates show improvement since previous study, consistent with decreased pneumonitis or edema. No evidence of pulmonary consolidation or pleural effusion. Cervical spine fusion hardware noted. IMPRESSION: Low lung volumes noted. Improving interstitial infiltrates, consistent with decreased  pneumonitis or edema. Electronically Signed   By: Danae Orleans M.D.   On:  12/16/2022 10:28   DG Chest Port 1 View  Result Date: 12/13/2022 CLINICAL DATA:  [address].  Respiratory failure. EXAM: PORTABLE CHEST 1 VIEW COMPARISON:  Portable chest 12/05/2022 FINDINGS: 4:59 a.m. ETT tip is 3.1 cm from the carina. NGT enters the stomach but the intragastric course was not filmed. Right IJ line terminates in mid to distal SVC. Patchy airspace disease continues to be noted in the lower lung fields and seems unchanged. There is no substantial pleural effusion. The upper lung fields remain clear. The cardiomediastinal silhouette is normal with calcification again in the transverse aorta. No acute osseous abnormality. There is lower cervical fusion hardware partially visible. Compare: Overall aeration is unchanged. IMPRESSION: 1. No significant change in patchy airspace disease in the lower lung fields. 2. Support apparatus stable. 3. Aortic atherosclerosis. Electronically Signed   By: Almira Bar M.D.   On: 12/13/2022 07:48   ECHOCARDIOGRAM LIMITED  Result Date: 12/06/2022    ECHOCARDIOGRAM LIMITED REPORT   Patient Name:   COLSON MARCILLE Date of Exam: 12/06/2022 Medical Rec #:  [id number]    Height:       72.0 in Accession #:    [id number]   Weight:       236.1 lb Date of Birth:  1960/12/16   BSA:          2.286 m Patient Age:    61 years     BP:           112/71 mmHg Patient Gender: M            HR:           79 bpm. Exam Location:  Inpatient Procedure: Limited Echo, Cardiac Doppler, Limited Color Doppler and Intracardiac            Opacification Agent Indications:    Dilated cardiomyopathy I42.0  History:        Patient has prior history of Echocardiogram examinations, most                 recent 09/19/2022. CHF, Sepsis and COPD; Risk                 Factors:Hypertension, Dyslipidemia, Diabetes and Current Smoker.  Sonographer:    Aron Baba Referring Phys: [id number] Tobey Grim  Sonographer Comments: Echo performed  with patient supine and on artificial respirator and Technically difficult study due to poor echo windows. Image acquisition challenging due to respiratory motion. IMPRESSIONS  1. Endocardial borders challenging . Left ventricular ejection fraction, by estimation, is 50 to 55%. The left ventricle has low normal function.  2. Right ventricular systolic function is normal. The right ventricular size is not well visualized. Tricuspid regurgitation signal is inadequate for assessing PA pressure.  3. No evidence of mitral valve regurgitation.  4. Aortic valve regurgitation is mild.  5. On a ventilator. The inferior vena cava is dilated in size with <50% respiratory variability, suggesting right atrial pressure of 15 mmHg. Conclusion(s)/Recommendation(s): Overall EF appears prserved. valves not well visualized. FINDINGS  Left Ventricle: Endocardial borders challenging. Left ventricular ejection fraction, by estimation, is 50 to 55%. The left ventricle has low normal function. Definity contrast agent was given IV to delineate the left ventricular endocardial borders. There is no left ventricular hypertrophy. Right Ventricle: The right ventricular size is not well visualized. Right ventricular systolic function is normal. Tricuspid regurgitation signal is inadequate for assessing PA pressure. Left Atrium: Left atrial size was normal in size. Right Atrium: Right atrial size  was normal in size. Tricuspid Valve: Tricuspid valve regurgitation is not demonstrated. Aortic Valve: Aortic valve regurgitation is mild. Venous: On a ventilator. The inferior vena cava is dilated in size with less than 50% respiratory variability, suggesting right atrial pressure of 15 mmHg. Additional Comments: Spectral Doppler performed. Color Doppler performed.  LEFT VENTRICLE PLAX 2D LVIDd:         5.00 cm LVIDs:         3.70 cm LV PW:         1.10 cm LV IVS:        1.00 cm  RIGHT VENTRICLE TAPSE (M-mode): 1.3 cm LEFT ATRIUM         Index LA diam:     3.70 cm 1.62 cm/m  MITRAL VALVE MV Area (PHT): 4.06 cm MV Decel Time: 187 msec MV E velocity: 66.80 cm/s MV A velocity: 88.70 cm/s MV E/A ratio:  0.75 Halford Decamp signed by Carolan Clines Signature Date/Time: 12/06/2022/11:17:24 AM    Final    CT HEAD WO CONTRAST ( )  Result Date: 12/05/2022 CLINICAL DATA:  Altered mental status. EXAM: CT HEAD WITHOUT CONTRAST TECHNIQUE: Contiguous axial images were obtained from the base of the skull through the vertex without intravenous contrast. RADIATION DOSE REDUCTION: This exam was performed according to the departmental dose-optimization program which includes automated exposure control, adjustment of the mA and/or kV according to patient size and/or use of iterative reconstruction technique. COMPARISON:  Head CT dated 09/18/2022. FINDINGS: Brain: There is mild age-related atrophy and chronic microvascular ischemic changes. Evolution of right occipital infarct. There is no acute intracranial hemorrhage. No mass effect or midline shift. No extra-axial fluid collection. Vascular: No hyperdense vessel or unexpected calcification. Skull: Normal. Negative for fracture or focal lesion. Sinuses/Orbits: The visualized paranasal sinuses and mastoid air cells are clear. Other: An endotracheal and an enteric tube are partially visualized. Multiple surgical clips in the soft tissues of the right face as seen previously. IMPRESSION: 1. No acute intracranial hemorrhage. 2. Mild age-related atrophy and chronic microvascular ischemic changes. 3. Evolution of right occipital infarct. Electronically Signed   By: Elgie Collard M.D.   On: 12/05/2022 23:56   Portable Chest x-ray  Result Date: 12/05/2022 CLINICAL DATA:  Intubated EXAM: PORTABLE CHEST 1 VIEW COMPARISON:  12/03/2022, CT 12/03/2022 FINDINGS: Endotracheal tube tip about 4.5 cm superior to the carina. Esophageal tube tip below the diaphragm but incompletely visualized. Cardiomegaly. Interim development of  heterogeneous bilateral airspace disease. Right IJ central venous catheter tip over the SVC IMPRESSION: 1. Interim development of heterogeneous bilateral airspace disease, edema versus diffuse pneumonia. 2. Cardiomegaly. 3. Interim placement of support lines and tubes as above. Endotracheal tube tip about 4.5 cm superior to carina Electronically Signed   By: Jasmine Pang M.D.   On: 12/05/2022 23:00   DG Abd Portable 1V  Result Date: 12/05/2022 CLINICAL DATA:  Check gastric catheter placement EXAM: PORTABLE ABDOMEN - 1 VIEW COMPARISON:  Film from earlier in the same day. FINDINGS: Gastric catheter is noted in the more distal stomach. Multiple ingested tablets are again seen. No free air is noted. IMPRESSION: Gastric catheter within the stomach. Electronically Signed   By: Alcide Clever M.D.   On: 12/05/2022 21:18   DG Abd Portable 1V  Result Date: 12/05/2022 CLINICAL DATA:  OG tube placement EXAM: PORTABLE ABDOMEN - 1 VIEW COMPARISON:  KUB 1 day prior FINDINGS: The enteric catheter tip and sidehole project over the stomach. There is a nonobstructive bowel gas pattern  in the imaged upper abdomen. There is no definite free intraperitoneal air, within the confines of supine technique. Ovoid radiodense objects in the left upper quadrant are unchanged. IMPRESSION: Enteric catheter tip and sidehole in the stomach. Electronically Signed   By: Lesia Hausen M.D.   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 There is previous removal of distal portions of first and second metatarsals. These findings have not changed. There is marked soft tissue swelling with interval worsening. There are no pockets of air in the soft tissues. Smooth marginated calcifications noted adjacent to the stump of first metatarsal have not changed.  No focal lytic lesions are seen. IMPRESSION: Previous surgical removal of big toe and second toe along with the distal portions of first and second metatarsals. This finding has not changed. No recent fracture is seen. There are no focal lytic lesions. Electronically Signed   By: Ernie Avena M.D.   On: 12/04/2022 14:31   DG Abd Portable 1V  Result Date: 12/04/2022 CLINICAL DATA:  NG tube placement EXAM: PORTABLE ABDOMEN - 1 VIEW COMPARISON:  CT done on 12/03/2022 FINDINGS: Tip and side port in NG tube are noted in the region of body of the stomach. There are multiple smooth marginated calcifications in left upper quadrant of abdomen which were shown to be between spleen and liver in the previous CT. Lower abdomen and pelvis are not included. IMPRESSION: Tip and side port in NG tube are noted within the body of the stomach. Electronically Signed   By: Ernie Avena M.D.   On: 12/04/2022 14:27   DG Chest Port 1 View  Result Date: 12/03/2022 CLINICAL DATA:  Possible sepsis EXAM: PORTABLE CHEST 1 VIEW COMPARISON:  09/18/2022 FINDINGS: Cardiac shadow is stable. Lungs are well aerated bilaterally. No focal infiltrate or effusion is seen. Postsurgical changes in the cervical spine are noted. IMPRESSION: No active disease. Electronically Signed   By: Alcide Clever M.D.   On: 12/03/2022 19:32   CT Angio Chest PE W and/or Wo Contrast  Result Date: 12/03/2022 CLINICAL DATA:  Hypoxia with shortness of breath and tachycardia Generalized abdominal pain and distension.  Sepsis. EXAM: CT ANGIOGRAPHY CHEST CT ABDOMEN AND PELVIS WITH CONTRAST TECHNIQUE: Multidetector CT imaging of the chest was performed using the standard protocol during bolus administration of intravenous contrast. Multiplanar CT image reconstructions and MIPs were obtained to evaluate the vascular anatomy. Multidetector CT imaging of the abdomen and pelvis was performed using the standard protocol during bolus administration of intravenous  contrast. RADIATION DOSE REDUCTION: This exam was performed according to the departmental dose-optimization program which includes automated exposure control, adjustment of the mA and/or kV according to patient size and/or use of iterative reconstruction technique. CONTRAST:  75mL OMNIPAQUE IOHEXOL 350 MG/ML SOLN COMPARISON:  07/04/2021 chest CT FINDINGS: CTA CHEST FINDINGS Cardiovascular: Satisfactory opacification of the pulmonary arteries to the segmental level. No evidence of pulmonary embolism. Normal heart size. No pericardial effusion. Atherosclerosis affecting the aorta and coronaries. Mediastinum/Nodes: Negative for mass or adenopathy Lungs/Pleura: Patchy haziness of the bilateral lungs, likely atelectasis. No consolidation, edema, effusion, or pneumothorax. There is some mucoid impaction within left lower lobe airways. Musculoskeletal: No acute or aggressive finding Review of the MIP images confirms the above findings. CT ABDOMEN and PELVIS FINDINGS Hepatobiliary: Subcentimeter  low-density in the central liver, too small for accurate densitometry.Cholelithiasis. No evidence of biliary obstruction or inflammation. Pancreas: Unremarkable. Spleen: Unremarkable. Adrenals/Urinary Tract: Negative adrenals. No hydronephrosis or stone. Collapsed urinary bladder around a Foley catheter. Stomach/Bowel: Diffuse distension of the colon. Some mild pericholecystic fat edema is present distally. Fluid levels are seen proximally and desiccated stool distally. No volvulus or discrete obstructing lesion is seen. Despite distal desiccated stool, no convincing rectal impaction. Vascular/Lymphatic: No acute vascular abnormality. No mass or adenopathy. Reproductive:No pathologic findings. Other: No ascites or pneumoperitoneum. Musculoskeletal: No acute abnormalities. Muscular atrophy atraumatically affecting the right hip musculature. Generalized lumbar spine degeneration. Review of the MIP images confirms the above findings.  IMPRESSION: Chest CTA: 1. Negative for pulmonary embolism or other acute finding. 2. Atherosclerosis including the coronary arteries. Abdominal CT: 1. Diffuse colonic distension and tortuosity. There is desiccated stool distally but the overall pattern favors adynamic ileus over mechanical obstruction. 2. Cholelithiasis and atherosclerosis. Electronically Signed   By: Tiburcio Pea M.D.   On: 12/03/2022 19:12   CT ABDOMEN PELVIS W CONTRAST  Result Date: 12/03/2022 CLINICAL DATA:  Hypoxia with shortness of breath and tachycardia Generalized abdominal pain and distension.  Sepsis. EXAM: CT ANGIOGRAPHY CHEST CT ABDOMEN AND PELVIS WITH CONTRAST TECHNIQUE: Multidetector CT imaging of the chest was performed using the standard protocol during bolus administration of intravenous contrast. Multiplanar CT image reconstructions and MIPs were obtained to evaluate the vascular anatomy. Multidetector CT imaging of the abdomen and pelvis was performed using the standard protocol during bolus administration of intravenous contrast. RADIATION DOSE REDUCTION: This exam was performed according to the departmental dose-optimization program which includes automated exposure control, adjustment of the mA and/or kV according to patient size and/or use of iterative reconstruction technique. CONTRAST:  75mL OMNIPAQUE IOHEXOL 350 MG/ML SOLN COMPARISON:  07/04/2021 chest CT FINDINGS: CTA CHEST FINDINGS Cardiovascular: Satisfactory opacification of the pulmonary arteries to the segmental level. No evidence of pulmonary embolism. Normal heart size. No pericardial effusion. Atherosclerosis affecting the aorta and coronaries. Mediastinum/Nodes: Negative for mass or adenopathy Lungs/Pleura: Patchy haziness of the bilateral lungs, likely atelectasis. No consolidation, edema, effusion, or pneumothorax. There is some mucoid impaction within left lower lobe airways. Musculoskeletal: No acute or aggressive finding Review of the MIP images  confirms the above findings. CT ABDOMEN and PELVIS FINDINGS Hepatobiliary: Subcentimeter low-density in the central liver, too small for accurate densitometry.Cholelithiasis. No evidence of biliary obstruction or inflammation. Pancreas: Unremarkable. Spleen: Unremarkable. Adrenals/Urinary Tract: Negative adrenals. No hydronephrosis or stone. Collapsed urinary bladder around a Foley catheter. Stomach/Bowel: Diffuse distension of the colon. Some mild pericholecystic fat edema is present distally. Fluid levels are seen proximally and desiccated stool distally. No volvulus or discrete obstructing lesion is seen. Despite distal desiccated stool, no convincing rectal impaction. Vascular/Lymphatic: No acute vascular abnormality. No mass or adenopathy. Reproductive:No pathologic findings. Other: No ascites or pneumoperitoneum. Musculoskeletal: No acute abnormalities. Muscular atrophy atraumatically affecting the right hip musculature. Generalized lumbar spine degeneration. Review of the MIP images confirms the above findings. IMPRESSION: Chest CTA: 1. Negative for pulmonary embolism or other acute finding. 2. Atherosclerosis including the coronary arteries. Abdominal CT: 1. Diffuse colonic distension and tortuosity. There is desiccated stool distally but the overall pattern favors adynamic ileus over mechanical obstruction. 2. Cholelithiasis and atherosclerosis. Electronically Signed   By: Tiburcio Pea M.D.   On: 12/03/2022 19:12   (Echo, Carotid, EGD, Colonoscopy, ERCP)    Subjective: Patient seen and examined.  Has incisional pain.  He is hesitant to  discharge.  We discussed about importance of mobilizing, wound care, oral pain medications.   Discharge Exam: Vitals:   12/25/22 0500 12/25/22 0809  BP:  132/62  Pulse:  65  Resp: 20 19  Temp:  98.4 F (36.9 C)  SpO2:  98%   Vitals:   12/25/22 0448 12/25/22 0450 12/25/22 0500 12/25/22 0809  BP: 117/71   132/62  Pulse: 62   65  Resp: 20 18 20 19   Temp:  98.2 F (36.8 C)   98.4 F (36.9 C)  TempSrc: Oral   Oral  SpO2: 94%   98%  Weight:   96.6 kg   Height:        General: Pt is alert, awake, not in acute distress Chronically sick looking.  Mild distress due to pain. Cardiovascular: RRR, S1/S2 +, no rubs, no gallops Respiratory: CTA bilaterally, no wheezing, no rhonchi, on 1 L oxygen. Abdominal: Soft, mild tenderness along the incision line.  Colostomy with loose stool.  Midline incision full-thickness open and clean. 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 Left leg with chronic venous stasis changes    The results of significant diagnostics from this hospitalization (including imaging, microbiology, ancillary and laboratory) are listed below for reference.     Microbiology: No results found for this or any previous visit (from the past 240 hour(s)).   Labs: BNP (last 3 results) Recent Labs    09/05/22 1635 09/13/22 0830 12/03/22 1920  BNP 39.9 55.6 45.7   Basic Metabolic Panel: Recent Labs  Lab 12/19/22 0047 12/20/22 0042 12/21/22 0123 12/22/22 0120 12/23/22 0118  NA 132* 130* 129* 131* 132*  K 4.9 4.6 5.2* 5.3* 4.7  CL 103 102 100 103 104  CO2 20* 18* 18* 20* 19*  GLUCOSE 161* 137* 137* 116* 136*  BUN 37* 37* 39* 48* 49*  CREATININE 2.01* 2.10* 2.19* 2.34* 2.24*  CALCIUM 8.3* 8.4* 8.4* 8.6* 8.2*  MG 2.1 1.6*  --   --  1.9  PHOS 5.2* 5.1*  --   --  6.5*   Liver Function Tests: Recent Labs  Lab 12/19/22 0047  AST 27  ALT 25  ALKPHOS 178*  BILITOT 1.0  PROT 6.2*  ALBUMIN <1.5*   No results for input(s): "LIPASE", "AMYLASE" in the last 168 hours. No results for input(s): "AMMONIA" in the last 168 hours. CBC: Recent Labs  Lab 12/19/22 0047 12/20/22 1143 12/22/22 0120 12/23/22 0118 12/24/22 0121  WBC 9.1 10.6* 10.2 9.1 9.3  NEUTROABS 6.2 6.9 6.6 6.0 5.6  HGB 8.3* 8.1* 8.1* 7.4* 7.6*  HCT 26.5* 26.3* 26.0* 24.0* 24.7*  MCV 93.6 91.3 93.2 91.3 92.5   PLT 549* 537* 507* 504* 477*   Cardiac Enzymes: No results for input(s): "CKTOTAL", "CKMB", "CKMBINDEX", "TROPONINI" in the last 168 hours. BNP: Invalid input(s): "POCBNP" CBG: Recent Labs  Lab 12/24/22 0811 12/24/22 1145 12/24/22 1654 12/24/22 2056 12/25/22 0607  GLUCAP 127* 103* 108* 146* 98   D-Dimer No results for input(s): "DDIMER" in the last 72 hours. Hgb A1c No results for input(s): "HGBA1C" in the last 72 hours. Lipid Profile No results for input(s): "CHOL", "HDL", "LDLCALC", "TRIG", "CHOLHDL", "LDLDIRECT" in the last 72 hours. Thyroid function studies No results for input(s): "TSH", "T4TOTAL", "T3FREE", "THYROIDAB" in the last 72 hours.  Invalid input(s): "FREET3" Anemia work up No results for input(s): "VITAMINB12", "FOLATE", "FERRITIN", "TIBC", "IRON", "RETICCTPCT" in the last 72 hours. Urinalysis    Component Value Date/Time   COLORURINE GREEN (  A) 12/03/2022 1654   APPEARANCEUR HAZY (A) 12/03/2022 1654   LABSPEC  12/03/2022 1654    TEST NOT REPORTED DUE TO COLOR INTERFERENCE OF URINE PIGMENT   PHURINE  12/03/2022 1654    TEST NOT REPORTED DUE TO COLOR INTERFERENCE OF URINE PIGMENT   GLUCOSEU (A) 12/03/2022 1654    TEST NOT REPORTED DUE TO COLOR INTERFERENCE OF URINE PIGMENT   HGBUR (A) 12/03/2022 1654    TEST NOT REPORTED DUE TO COLOR INTERFERENCE OF URINE PIGMENT   BILIRUBINUR (A) 12/03/2022 1654    TEST NOT REPORTED DUE TO COLOR INTERFERENCE OF URINE PIGMENT   KETONESUR (A) 12/03/2022 1654    TEST NOT REPORTED DUE TO COLOR INTERFERENCE OF URINE PIGMENT   PROTEINUR (A) 12/03/2022 1654    TEST NOT REPORTED DUE TO COLOR INTERFERENCE OF URINE PIGMENT   UROBILINOGEN 1.0 02/03/2014 0052   NITRITE (A) 12/03/2022 1654    TEST NOT REPORTED DUE TO COLOR INTERFERENCE OF URINE PIGMENT   LEUKOCYTESUR (A) 12/03/2022 1654    TEST NOT REPORTED DUE TO COLOR INTERFERENCE OF URINE PIGMENT   Sepsis Labs Recent Labs  Lab 12/20/22 1143 12/22/22 0120 12/23/22 0118  12/24/22 0121  WBC 10.6* 10.2 9.1 9.3   Microbiology No results found for this or any previous visit (from the past 240 hour(s)).   Time coordinating discharge:  32 minutes  SIGNED:   Dorcas Carrow, MD  [facility] 12/25/2022, 9:35 AM

## 2023-01-07 ENCOUNTER — Encounter (HOSPITAL_COMMUNITY): Payer: Self-pay

## 2023-01-07 ENCOUNTER — Other Ambulatory Visit: Payer: Self-pay

## 2023-01-07 ENCOUNTER — Emergency Department (HOSPITAL_COMMUNITY)
Admission: EM | Admit: 2023-01-07 | Discharge: 2023-01-08 | Disposition: A | Discharge status: Home / Self Care | Payer: Medicaid Other | Attending: Emergency Medicine | Admitting: Emergency Medicine

## 2023-01-07 DIAGNOSIS — Z91040 Latex allergy status: Secondary | ICD-10-CM | POA: Insufficient documentation

## 2023-01-07 DIAGNOSIS — Z7982 Long term (current) use of aspirin: Secondary | ICD-10-CM | POA: Diagnosis not present

## 2023-01-07 DIAGNOSIS — D649 Anemia, unspecified: Secondary | ICD-10-CM | POA: Diagnosis present

## 2023-01-07 DIAGNOSIS — Z7951 Long term (current) use of inhaled steroids: Secondary | ICD-10-CM | POA: Insufficient documentation

## 2023-01-07 DIAGNOSIS — E119 Type 2 diabetes mellitus without complications: Secondary | ICD-10-CM | POA: Diagnosis not present

## 2023-01-07 DIAGNOSIS — R140 Abdominal distension (gaseous): Secondary | ICD-10-CM | POA: Insufficient documentation

## 2023-01-07 DIAGNOSIS — Z794 Long term (current) use of insulin: Secondary | ICD-10-CM | POA: Diagnosis not present

## 2023-01-07 DIAGNOSIS — J449 Chronic obstructive pulmonary disease, unspecified: Secondary | ICD-10-CM | POA: Diagnosis not present

## 2023-01-07 LAB — CBC WITH DIFFERENTIAL/PLATELET
Abs Immature Granulocytes: 0.08 10*3/uL — ABNORMAL HIGH (ref 0.00–0.07)
Basophils Absolute: 0 10*3/uL (ref 0.0–0.1)
Basophils Relative: 1 %
Eosinophils Absolute: 0.3 10*3/uL (ref 0.0–0.5)
Eosinophils Relative: 3 %
HCT: 22.2 % — ABNORMAL LOW (ref 39.0–52.0)
Hemoglobin: 6.9 g/dL — CL (ref 13.0–17.0)
Immature Granulocytes: 1 %
Lymphocytes Relative: 13 %
Lymphs Abs: 1.2 10*3/uL (ref 0.7–4.0)
MCH: 28.2 pg (ref 26.0–34.0)
MCHC: 31.1 g/dL (ref 30.0–36.0)
MCV: 90.6 fL (ref 80.0–100.0)
Monocytes Absolute: 0.8 10*3/uL (ref 0.1–1.0)
Monocytes Relative: 9 %
Neutro Abs: 6.5 10*3/uL (ref 1.7–7.7)
Neutrophils Relative %: 73 %
Platelets: 301 10*3/uL (ref 150–400)
RBC: 2.45 MIL/uL — ABNORMAL LOW (ref 4.22–5.81)
RDW: 16.1 % — ABNORMAL HIGH (ref 11.5–15.5)
WBC: 8.9 10*3/uL (ref 4.0–10.5)
nRBC: 0 % (ref 0.0–0.2)

## 2023-01-07 LAB — BASIC METABOLIC PANEL
Anion gap: 11 (ref 5–15)
BUN: 31 mg/dL — ABNORMAL HIGH (ref 8–23)
CO2: 26 mmol/L (ref 22–32)
Calcium: 8.2 mg/dL — ABNORMAL LOW (ref 8.9–10.3)
Chloride: 97 mmol/L — ABNORMAL LOW (ref 98–111)
Creatinine, Ser: 1.81 mg/dL — ABNORMAL HIGH (ref 0.61–1.24)
GFR, Estimated: 41 mL/min — ABNORMAL LOW (ref >60)
Glucose, Bld: 147 mg/dL — ABNORMAL HIGH (ref 70–99)
Potassium: 3.1 mmol/L — ABNORMAL LOW (ref 3.5–5.1)
Sodium: 134 mmol/L — ABNORMAL LOW (ref 135–145)

## 2023-01-07 LAB — TYPE AND SCREEN
Antibody Screen: NEGATIVE
Unit division: 0

## 2023-01-07 LAB — BPAM RBC
Blood Product Expiration Date: 202408252359
ISSUE DATE / TIME: 202407221518
Unit Type and Rh: 5100

## 2023-01-07 LAB — PREPARE RBC (CROSSMATCH)

## 2023-01-07 MED ORDER — OXYCODONE HCL 5 MG PO TABS
10.0000 mg | ORAL_TABLET | Freq: Once | ORAL | Status: AC
  Administered 2023-01-07: 10 mg via ORAL
  Filled 2023-01-07: qty 2

## 2023-01-07 MED ORDER — OXYCODONE-ACETAMINOPHEN 5-325 MG PO TABS
1.0000 | ORAL_TABLET | Freq: Once | ORAL | Status: AC
  Administered 2023-01-07: 1 via ORAL
  Filled 2023-01-07: qty 1

## 2023-01-07 MED ORDER — SODIUM CHLORIDE 0.9% IV SOLUTION: Freq: Once | INTRAVENOUS | Status: AC

## 2023-01-07 NOTE — ED Notes (Signed)
Called [facility] skilled nursing facility X3 for report and no answer.

## 2023-01-07 NOTE — ED Provider Notes (Addendum)
[name] EMERGENCY DEPARTMENT AT [facility] Provider Note   CSN: [id number] Arrival date & time: 01/07/23  1305     History  No chief complaint on file.   Gregory [name] is a 62 y.o. male.   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  Patient states that he is eating and drinking and has been having output in his bag.  There is been no evidence of blood coming from his bag or the stool he is passing from below.  He denies having fever or trouble breathing.  Patient received 4 units of PRBCs while hospitalized and last hemoglobin was 7.4.  He denies any shortness of breath, cough or discomfort at this time.  The history is provided by the patient and medical records.       Home Medications Prior to Admission medications   Medication Sig Start Date End Date Taking? Authorizing Provider  aspirin EC 81 MG tablet Take 81 mg by mouth daily. Swallow whole.    [provider]  atorvastatin (LIPITOR) 80 MG tablet Take 1 tablet (80 mg total) by mouth daily. 09/22/22 01/27/24  Jerald Kief, MD  carvedilol (COREG) 12.5 MG tablet Take 1 tablet (12.5 mg total) by mouth 2 (two) times daily with a meal. 12/25/22   Dorcas Carrow, MD  cetirizine (ZYRTEC) 10 MG tablet Take 10 mg by mouth daily.    [provider]  Cholecalciferol 25 MCG (1000 UT) capsule Take 1,000 Units by mouth daily.    [provider]  Cranberry 450 MG CAPS Take 450 mg by mouth daily.    [provider]  cyanocobalamin 1000 MCG tablet Take 1,000 mcg by mouth daily.    [provider]  Dextran  70-Hypromellose (NATURAL BALANCE TEARS) 0.1-0.3 % SOLN Apply 1 drop to eye every 4 (four) hours as needed (dry eyes).    [provider]  escitalopram (LEXAPRO) 20 MG tablet Take 20 mg by mouth daily.    [provider]  ferrous sulfate 325 (65 FE) MG tablet Take 1 tablet (325 mg total) by mouth daily with breakfast. 11/23/18   Mirian Mo, MD  fluticasone [facility]) 50 MCG/ACT nasal spray Place 1 spray into both nostrils in the morning.    [provider]  furosemide (LASIX) 40 MG tablet Take 40 mg by mouth daily.    [provider]  gabapentin (NEURONTIN) 100 MG capsule Take 2 capsules (200 mg total) by mouth every 8 (eight) hours. 12/25/22 01/24/23  Dorcas Carrow, MD  hydrALAZINE (APRESOLINE) 10 MG tablet Take 1 tablet (10 mg total) by mouth every 8 (eight) hours. 12/25/22   Dorcas Carrow, MD  hydrOXYzine (ATARAX) 25 MG tablet Take 25 mg by mouth every 6 (six) hours as needed for anxiety.    [provider]  hydrOXYzine (VISTARIL) 50 MG capsule Take 50 mg by mouth at bedtime.    [provider]  insulin glargine (LANTUS SOLOSTAR) 100 UNIT/ML Solostar Pen Inject 15 Units into the skin daily. Patient taking differently: Inject 10 Units into the skin 2 (two) times daily. 09/07/22  Rai, Ripudeep K, MD  insulin lispro (HUMALOG) 100 UNIT/ML KwikPen Inject 0-10 Units into the skin See admin instructions. Inject 0-10 units into the skin three times a day and at bedtime, PER SLIDING SCALE:  BGL 151-200 = 2 units 201-250 = 4 units 251-300 = 6 units 301-350 =  8 units 351-400 = 10 units Patient taking differently: Inject 0-10 Units into the skin See admin instructions. Inject 0-10 units into the skin before meals and at bedtime, PER SLIDING SCALE:  0-150 = 0 units 151-200 = 2 units 201-250 = 4 units 251-300 = 6 units 301-350 =  8 units 351-400 = 10 units 01/31/22   Dahal, Melina Schools, MD  ipratropium-albuterol (DUONEB) 0.5-2.5 (3) MG/3ML SOLN Take 3 mLs by  nebulization every 6 (six) hours as needed (for shortness of breath).    [provider]  lidocaine (LIDODERM) 5 % Place 2 patches onto the skin daily. Remove & Discard patch within 12 hours or as directed by MD 12/25/22   Dorcas Carrow, MD  loperamide (IMODIUM) 2 MG capsule Take 1 capsule (2 mg total) by mouth 2 (two) times daily. 12/25/22   Dorcas Carrow, MD  magnesium oxide (MAG-OX) 400 (240 Mg) MG tablet Take 400 mg by mouth 2 (two) times daily.    [provider]  Melatonin 10 MG TABS Take 10 mg by mouth at bedtime.    [provider]  Meth-Hyo-M Bl-Na Phos-Ph Sal (URO-MP) 118 MG CAPS Take 118 mg by mouth 2 (two) times daily.    [provider]  mirabegron ER (MYRBETRIQ) 50 MG TB24 tablet Take 50 mg by mouth daily.    [provider]  Multiple Vitamin (MULTIVITAMIN WITH MINERALS) TABS tablet Take 1 tablet by mouth daily. 12/25/22   Dorcas Carrow, MD  naloxone [facility]) 4 MG/0.1ML LIQD nasal spray kit Place 1 spray into the nose as needed (poorly responsive or turning blue).    [provider]  omeprazole (PRILOSEC) 20 MG capsule Take 20 mg by mouth at bedtime.    [provider]  ondansetron (ZOFRAN) 4 MG tablet Take 4 mg by mouth every 8 (eight) hours as needed for nausea.    [provider]  oxymetazoline (AFRIN) 0.05 % nasal spray Place 1 spray into both nostrils 2 (two) times daily as needed for congestion.    [provider]  pregabalin (LYRICA) 100 MG capsule Take 1 capsule (100 mg total) by mouth 2 (two) times daily. 12/25/22 01/24/23  Dorcas Carrow, MD  QUEtiapine (SEROQUEL) 100 MG tablet Take 100 mg by mouth at bedtime.    [provider]  sodium bicarbonate 650 MG tablet Take 1 tablet (650 mg total) by mouth 2 (two) times daily. 12/25/22 01/24/23  Dorcas Carrow, MD  tamsulosin (FLOMAX) 0.4 MG CAPS capsule Take 0.4 mg by mouth daily. 11/26/13   [provider]  trolamine salicylate (ASPERCREME) 10 %  cream Apply 1 Application topically every 6 (six) hours as needed for muscle pain. Right leg    [provider]  TRULICITY 0.75 MG/0.5ML SOPN Inject 0.75 mg into the skin every Thursday. 11/29/22   [provider]      Allergies    Latex    Review of Systems   Review of Systems  Physical Exam Updated Vital Signs BP 122/80   Pulse 76   Temp 98.8 F (37.1 C) (Oral)   Resp 18   SpO2 96%  Physical Exam Vitals and nursing note reviewed.  Constitutional:  General: He is not in acute distress.    Appearance: He is well-developed.     Comments: Chronically ill-appearing  HENT:     Head: Normocephalic and atraumatic.  Eyes:     Conjunctiva/sclera: Conjunctivae normal.     Pupils: Pupils are equal, round, and reactive to light.  Cardiovascular:     Rate and Rhythm: Normal rate and regular rhythm.     Heart sounds: No murmur heard. Pulmonary:     Effort: Pulmonary effort is normal. No respiratory distress.     Breath sounds: Normal breath sounds. No wheezing or rales.  Abdominal:     General: There is distension.     Palpations: Abdomen is soft.     Tenderness: There is no abdominal tenderness. There is no guarding or rebound.     Comments: Open midline abdominal wound with greenish-yellow drainage.  Ostomy in place with yellow liquid in the bag  Musculoskeletal:        General: No tenderness. Normal range of motion.     Cervical back: Normal range of motion and neck supple.     Comments: Right BKA, left lower extremity with wounds that are bandaged and no drainage on the bandages  Skin:    General: Skin is warm and dry.     Coloration: Skin is pale.     Findings: No erythema or rash.  Neurological:     Mental Status: He is alert and oriented to person, place, and time. Mental status is at baseline.  Psychiatric:        Behavior: Behavior normal.     ED Results / Procedures / Treatments   Labs (all labs ordered are listed, but only abnormal results  are displayed) Labs Reviewed  CBC WITH DIFFERENTIAL/PLATELET - Abnormal; Notable for the following components:      Result Value   RBC 2.45 (*)    Hemoglobin 6.9 (*)    HCT 22.2 (*)    RDW 16.1 (*)    Abs Immature Granulocytes 0.08 (*)    All other components within normal limits  BASIC METABOLIC PANEL - Abnormal; Notable for the following components:   Sodium 134 (*)    Potassium 3.1 (*)    Chloride 97 (*)    Glucose, Bld 147 (*)    BUN 31 (*)    Creatinine, Ser 1.81 (*)    Calcium 8.2 (*)    GFR, Estimated 41 (*)    All other components within normal limits  TYPE AND SCREEN  PREPARE RBC (CROSSMATCH)    EKG None  Radiology No results found.  Procedures Procedures    Medications Ordered in ED Medications  0.9 %  sodium chloride infusion (Manually program via Guardrails IV Fluids) (has no administration in time range)  oxyCODONE-acetaminophen (PERCOCET/ROXICET) 5-325 MG per tablet 1 tablet (1 tablet Oral Given 01/07/23 1436)    ED Course/ Medical Decision Making/ A&P                             Medical Decision Making Amount and/or Complexity of Data Reviewed Labs: ordered. Decision-making details documented in ED Course.  Risk Prescription drug management.   Pt with multiple medical problems and comorbidities and presenting today with a complaint that caries a high risk for morbidity and mortality.  Here today for concern for low hemoglobin.  Patient recently underwent extensive surgery after having sepsis and ischemic bowel.  It seems that patient has been has  been doing fairly well since returning to [facility].  He is not on any anticoagulation.  They checked his hemoglobin several days ago and it was low at 6.3 and per report they checked it again today and it was 6.7.  Will ensure patient was not having worsening anemia.  When he left the hospital it was 7.5.  He does not have any evidence of intestinal bleeding as his ostomy bag shows a thin yellow liquid  with no evidence of blood.  2:59 PM I independently interpreted patient's labs today and CBC today with a hemoglobin of 6.9 which is decreased from in the sevens when he left but does not seem any worse than the most recent test while he was at [facility].  Will plan on transfusing 1 unit but otherwise feel that patient can return to [facility] today as he otherwise appears to be doing well.  Most likely from recent surgery due to pt having no active signs of bleeding today.  BMP with improving creatinine down to 1.8 down from in the twos and mild hypokalemia of 3.1 will give oral replacement.        Final Clinical Impression(s) / ED Diagnoses Final diagnoses:  Anemia, unspecified type    Rx / DC Orders ED Discharge Orders     None         Gwyneth Sprout, MD 01/07/23 1453    Gwyneth Sprout, MD 01/07/23 1459

## 2023-01-07 NOTE — ED Triage Notes (Signed)
Patient bib [facility] from the [facility]. The facility sent him to the ED for a blood transfusion due to low hemoglobin.

## 2023-01-07 NOTE — ED Notes (Signed)
[facility] called 

## 2023-01-07 NOTE — ED Provider Notes (Signed)
Signout from Dr. Anitra Lauth.  62 year old male here for low hemoglobin.  He had a recent admission for sepsis ischemic colon status post colectomy ileostomy.  Hemoglobin trending down at rehab.  Patient denies any complaints.  Plan is for transfusion of 1 unit packed red blood cells.  Plan is discharge back to rehab after transfusion. Physical Exam  BP 119/60   Pulse 74   Temp 98.1 F (36.7 C) (Oral)   Resp 18   Ht 6' (1.829 m)   Wt 104.3 kg   SpO2 97%   BMI 31.19 kg/m   Physical Exam  Procedures  Procedures  ED Course / MDM    Medical Decision Making Amount and/or Complexity of Data Reviewed Labs: ordered.  Risk Prescription drug management.   Patient has been transfused and has no complaints.  Will return back to facility per plan.  Will need repeat blood work at facility to make sure hemoglobin is stabilizing.       Terrilee Files, MD 01/08/23 1003

## 2023-01-07 NOTE — ED Notes (Signed)
Patient given crackers and water for a snack.

## 2023-01-07 NOTE — Discharge Instructions (Addendum)
Patient will need to repeat CBC in 1 week.  Rest of his labs are improving.  He did receive 1 unit of blood today.  Needs to continue scheduled follow-up and wound care.

## 2023-01-08 ENCOUNTER — Encounter (HOSPITAL_BASED_OUTPATIENT_CLINIC_OR_DEPARTMENT_OTHER): Payer: Medicaid Other | Admitting: Internal Medicine

## 2023-01-08 LAB — TYPE AND SCREEN: ABO/RH(D): O POS

## 2023-01-08 LAB — BPAM RBC

## 2023-01-23 ENCOUNTER — Emergency Department (HOSPITAL_COMMUNITY)
Admission: EM | Admit: 2023-01-23 | Discharge: 2023-01-23 | Disposition: A | Discharge status: Home / Self Care | Payer: Medicaid Other | Attending: Emergency Medicine | Admitting: Emergency Medicine

## 2023-01-23 ENCOUNTER — Encounter (HOSPITAL_COMMUNITY): Payer: Self-pay | Admitting: Emergency Medicine

## 2023-01-23 DIAGNOSIS — E876 Hypokalemia: Secondary | ICD-10-CM | POA: Diagnosis not present

## 2023-01-23 DIAGNOSIS — Z7982 Long term (current) use of aspirin: Secondary | ICD-10-CM | POA: Insufficient documentation

## 2023-01-23 DIAGNOSIS — D631 Anemia in chronic kidney disease: Secondary | ICD-10-CM | POA: Diagnosis not present

## 2023-01-23 DIAGNOSIS — D508 Other iron deficiency anemias: Secondary | ICD-10-CM | POA: Insufficient documentation

## 2023-01-23 DIAGNOSIS — N1832 Chronic kidney disease, stage 3b: Secondary | ICD-10-CM | POA: Diagnosis not present

## 2023-01-23 DIAGNOSIS — D649 Anemia, unspecified: Secondary | ICD-10-CM

## 2023-01-23 DIAGNOSIS — D638 Anemia in other chronic diseases classified elsewhere: Secondary | ICD-10-CM

## 2023-01-23 DIAGNOSIS — Z91040 Latex allergy status: Secondary | ICD-10-CM | POA: Diagnosis not present

## 2023-01-23 LAB — CBC
HCT: 25.1 % — ABNORMAL LOW (ref 39.0–52.0)
Hemoglobin: 7.4 g/dL — ABNORMAL LOW (ref 13.0–17.0)
MCH: 27.5 pg (ref 26.0–34.0)
MCHC: 29.5 g/dL — ABNORMAL LOW (ref 30.0–36.0)
MCV: 93.3 fL (ref 80.0–100.0)
Platelets: 282 10*3/uL (ref 150–400)
RBC: 2.69 MIL/uL — ABNORMAL LOW (ref 4.22–5.81)
RDW: 15.9 % — ABNORMAL HIGH (ref 11.5–15.5)
WBC: 9.5 10*3/uL (ref 4.0–10.5)
nRBC: 0 % (ref 0.0–0.2)

## 2023-01-23 LAB — BASIC METABOLIC PANEL
Anion gap: 12 (ref 5–15)
BUN: 21 mg/dL (ref 8–23)
CO2: 26 mmol/L (ref 22–32)
Calcium: 8.2 mg/dL — ABNORMAL LOW (ref 8.9–10.3)
Chloride: 97 mmol/L — ABNORMAL LOW (ref 98–111)
Creatinine, Ser: 1.55 mg/dL — ABNORMAL HIGH (ref 0.61–1.24)
GFR, Estimated: 50 mL/min — ABNORMAL LOW (ref >60)
Glucose, Bld: 143 mg/dL — ABNORMAL HIGH (ref 70–99)
Potassium: 3.2 mmol/L — ABNORMAL LOW (ref 3.5–5.1)
Sodium: 135 mmol/L (ref 135–145)

## 2023-01-23 LAB — TYPE AND SCREEN
ABO/RH(D): O POS
Antibody Screen: NEGATIVE

## 2023-01-23 MED ORDER — FERROUS SULFATE 325 (65 FE) MG PO TABS: 325.0000 mg | ORAL_TABLET | Freq: Three times a day (TID) | ORAL | 0 refills | Status: DC

## 2023-01-23 MED ORDER — OXYMETAZOLINE HCL 0.05 % NA SOLN
1.0000 | Freq: Once | NASAL | Status: AC
  Administered 2023-01-23: 1 via NASAL
  Filled 2023-01-23: qty 30

## 2023-01-23 MED ORDER — POTASSIUM CHLORIDE CRYS ER 20 MEQ PO TBCR
40.0000 meq | EXTENDED_RELEASE_TABLET | Freq: Once | ORAL | Status: AC
  Administered 2023-01-23: 40 meq via ORAL
  Filled 2023-01-23: qty 2

## 2023-01-23 MED ORDER — OXYCODONE-ACETAMINOPHEN 5-325 MG PO TABS
1.0000 | ORAL_TABLET | Freq: Once | ORAL | Status: AC
  Administered 2023-01-23: 1 via ORAL
  Filled 2023-01-23: qty 1

## 2023-01-23 MED ORDER — ONDANSETRON 4 MG PO TBDP
8.0000 mg | ORAL_TABLET | Freq: Once | ORAL | Status: AC
  Administered 2023-01-23: 8 mg via ORAL
  Filled 2023-01-23: qty 2

## 2023-01-23 NOTE — ED Notes (Addendum)
Pt refusing continous monitoring and pulled off pulse oximetry.  Pt ripping off leads and monitors.  MD Steinl notified and aware.  Pt made aware of the importance of continuous monitoring while in the ED.  Pt states "he doesn't care."

## 2023-01-23 NOTE — ED Provider Notes (Signed)
[facility] AT [facility] Provider Note   CSN: [id number] Arrival date & time: 01/23/23  1254     History  Chief Complaint  Patient presents with   Anemia    Gregory [name] is a 62 y.o. male.  Pt with hx ckd, anemia, from SNF with reported very low hemoglobin. Pt indicates feels fine/at baseline. Is bed bound, not ambulatory at baseline. 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 Pt notes normal ostomy output, not black or bloody. Denies recent bleeding or blood loss. No faintness. No syncope. No abd pain. No fever or chills. States last transfusion was a couple weeks ago.   The history is provided by the patient, medical records and the EMS personnel. The history is limited by the condition of the patient.       Home Medications Prior to Admission medications   Medication Sig Start Date End Date Taking? Authorizing Provider  acetaminophen (TYLENOL) 500 MG tablet Take 1,000 mg by mouth every 8 (eight) hours as needed for mild pain.    [provider]  aspirin EC 81 MG tablet Take 81 mg by mouth daily. Swallow whole.    [provider]  atorvastatin (LIPITOR) 80 MG tablet Take 1 tablet (80 mg total) by mouth daily. 09/22/22 01/27/24  Jerald Kief, MD  carvedilol (COREG) 12.5 MG tablet Take 1 tablet (12.5 mg total) by mouth 2 (two) times daily with a meal. 12/25/22   Dorcas Carrow, MD  cetirizine (ZYRTEC) 10 MG tablet Take 10 mg by mouth daily.    [provider]  Cholecalciferol 25 MCG (1000 UT) capsule Take 1,000 Units by mouth daily.    [provider]  Cranberry 450 MG CAPS Take 450 mg by mouth daily.    [provider]  cyanocobalamin 1000 MCG tablet Take 1,000 mcg by mouth daily.    [provider]  cyclobenzaprine (FLEXERIL) 5 MG tablet Take 5 mg by mouth every 12 (twelve) hours as needed for muscle spasms.    [provider]  Dextran  70-Hypromellose (NATURAL BALANCE TEARS) 0.1-0.3 % SOLN Apply 1 drop to eye every 4 (four) hours as needed (dry eyes).    [provider]  escitalopram (LEXAPRO) 20 MG tablet Take 20 mg by mouth daily.    [provider]  ferrous sulfate 325 (65 FE) MG tablet Take 1 tablet (325 mg total) by mouth 3 (three) times daily with meals. 01/23/23   Cathren Laine, MD  fluticasone (FLONASE) 50 MCG/ACT nasal spray Place 1 spray into both nostrils daily.    [provider]  furosemide (LASIX) 40 MG tablet Take 40 mg by mouth daily.    [provider]  gabapentin (NEURONTIN) 100 MG capsule Take 2 capsules (200 mg total) by mouth every 8 (eight) hours. 12/25/22 01/24/23  Dorcas Carrow, MD  hydrALAZINE (APRESOLINE) 10 MG tablet Take 1 tablet (10 mg total) by mouth every 8 (eight) hours. 12/25/22   Dorcas Carrow, MD  hydrOXYzine (ATARAX) 25 MG tablet Take 25 mg by mouth every 6 (six) hours as needed for anxiety.    [provider]  hydrOXYzine (VISTARIL) 50 MG capsule Take 50 mg by mouth at bedtime.    [provider]  insulin glargine (LANTUS SOLOSTAR) 100 UNIT/ML Solostar Pen Inject 15 Units into the skin daily. Patient taking differently: Inject 10 Units into the skin at bedtime. 09/07/22   Rai, Delene Ruffini, MD  insulin lispro (  HUMALOG) 100 UNIT/ML KwikPen Inject 0-10 Units into the skin See admin instructions. Inject 0-10 units into the skin three times a day and at bedtime, PER SLIDING SCALE:  BGL 151-200 = 2 units 201-250 = 4 units 251-300 = 6 units 301-350 =  8 units 351-400 = 10 units Patient taking differently: Inject 0-10 Units into the skin See admin instructions. Inject 0-10 units into the skin before meals and at bedtime, PER SLIDING SCALE:  0-150 = 0 units 151-200 = 2 units 201-250 = 4 units 251-300 = 6 units 301-350 =  8 units 351-400 = 10 units 01/31/22   Dahal, Melina Schools, MD  ipratropium-albuterol (DUONEB) 0.5-2.5 (3) MG/3ML SOLN Take 3 mLs by  nebulization every 6 (six) hours as needed (for shortness of breath).    [provider]  lidocaine (LIDODERM) 5 % Place 2 patches onto the skin daily. Remove & Discard patch within 12 hours or as directed by MD 12/25/22   Dorcas Carrow, MD  loperamide (IMODIUM) 2 MG capsule Take 1 capsule (2 mg total) by mouth 2 (two) times daily. 12/25/22   Dorcas Carrow, MD  magnesium oxide (MAG-OX) 400 (240 Mg) MG tablet Take 400 mg by mouth 2 (two) times daily.    [provider]  Melatonin 10 MG TABS Take 10 mg by mouth at bedtime.    [provider]  Meth-Hyo-M Bl-Na Phos-Ph Sal (URO-MP) 118 MG CAPS Take 118 mg by mouth 2 (two) times daily.    [provider]  mirabegron ER (MYRBETRIQ) 50 MG TB24 tablet Take 50 mg by mouth daily.    [provider]  Multiple Vitamin (MULTIVITAMIN WITH MINERALS) TABS tablet Take 1 tablet by mouth daily. 12/25/22   Dorcas Carrow, MD  naloxone [facility]) 4 MG/0.1ML LIQD nasal spray kit Place 1 spray into the nose as needed (poorly responsive or turning blue).    [provider]  nystatin (MYCOSTATIN/NYSTOP) powder Apply 1 Application topically daily. To groin/scrotum    [provider]  omeprazole (PRILOSEC) 20 MG capsule Take 20 mg by mouth at bedtime.    [provider]  ondansetron (ZOFRAN) 4 MG tablet Take 4 mg by mouth every 8 (eight) hours as needed for nausea.    [provider]  Oxycodone HCl 10 MG TABS Take 10 mg by mouth every 4 (four) hours as needed (for pain.).    [provider]  oxymetazoline (AFRIN) 0.05 % nasal spray Place 1 spray into both nostrils 2 (two) times daily as needed for congestion.    [provider]  pregabalin (LYRICA) 100 MG capsule Take 1 capsule (100 mg total) by mouth 2 (two) times daily. 12/25/22 01/24/23  Dorcas Carrow, MD  QUEtiapine (SEROQUEL) 100 MG tablet Take 100 mg by mouth at bedtime.    [provider]  sodium bicarbonate 650 MG tablet  Take 1 tablet (650 mg total) by mouth 2 (two) times daily. 12/25/22 01/24/23  Dorcas Carrow, MD  tamsulosin (FLOMAX) 0.4 MG CAPS capsule Take 0.4 mg by mouth daily. 11/26/13   [provider]  trolamine salicylate (ASPERCREME) 10 % cream Apply 1 Application topically every 6 (six) hours as needed for muscle pain. Right leg    [provider]  TRULICITY 0.75 MG/0.5ML SOPN Inject 0.75 mg into the skin every Thursday. 11/29/22   [provider]      Allergies    Latex    Review of Systems   Review of Systems  Constitutional:  Negative for  chills and fever.  HENT:  Negative for nosebleeds.   Eyes:  Negative for redness.  Respiratory:  Negative for cough and shortness of breath.   Cardiovascular:  Negative for chest pain.  Gastrointestinal:  Negative for abdominal pain, blood in stool and vomiting.  Genitourinary:  Negative for flank pain and hematuria.  Musculoskeletal:  Negative for back pain and neck pain.  Skin:  Negative for rash.  Neurological:  Negative for light-headedness and headaches.    Physical Exam Updated Vital Signs BP (!) 141/129   Pulse 68   Temp 97.7 F (36.5 C) (Oral)   Resp 20   SpO2 100%  Physical Exam Vitals and nursing note reviewed.  Constitutional:      Appearance: Normal appearance. He is well-developed.  HENT:     Head: Atraumatic.     Nose: Nose normal.     Mouth/Throat:     Mouth: Mucous membranes are moist.     Pharynx: Oropharynx is clear.  Eyes:     General: No scleral icterus.    Comments: Conj pale.   Neck:     Trachea: No tracheal deviation.  Cardiovascular:     Rate and Rhythm: Normal rate and regular rhythm.     Pulses: Normal pulses.     Heart sounds: Normal heart sounds. No murmur heard.    No friction rub. No gallop.  Pulmonary:     Effort: Pulmonary effort is normal. No accessory muscle usage or respiratory distress.     Breath sounds: Normal breath sounds.  Abdominal:     General: Bowel sounds are  normal. There is no distension.     Palpations: Abdomen is soft. There is no mass.     Tenderness: There is no abdominal tenderness.     Comments: Right abd ileostomy is pink, patent, functioning, with brown stool in bag. Midline abd incision, open area, healing without sign of infection.   Genitourinary:    Comments: No cva tenderness. Musculoskeletal:        General: No swelling.     Cervical back: Normal range of motion and neck supple. No rigidity.  Skin:    General: Skin is warm and dry.     Coloration: Skin is pale.     Findings: No rash.  Neurological:     Mental Status: He is alert.     Comments: Alert, speech clear.   Psychiatric:        Mood and Affect: Mood normal.     ED Results / Procedures / Treatments   Labs (all labs ordered are listed, but only abnormal results are displayed) Results for orders placed or performed during the hospital encounter of 01/23/23  CBC  Result Value Ref Range   WBC 9.5 4.0 - 10.5 K/uL   RBC 2.69 (L) 4.22 - 5.81 MIL/uL   Hemoglobin 7.4 (L) 13.0 - 17.0 g/dL   HCT 75.6 (L) 43.3 - 29.5 %   MCV 93.3 80.0 - 100.0 fL   MCH 27.5 26.0 - 34.0 pg   MCHC 29.5 (L) 30.0 - 36.0 g/dL   RDW 18.8 (H) 41.6 - 60.6 %   Platelets 282 150 - 400 K/uL   nRBC 0.0 0.0 - 0.2 %  Basic metabolic panel  Result Value Ref Range   Sodium 135 135 - 145 mmol/L   Potassium 3.2 (L) 3.5 - 5.1 mmol/L   Chloride 97 (L) 98 - 111 mmol/L   CO2 26 22 - 32 mmol/L   Glucose, Bld 143 (  H) 70 - 99 mg/dL   BUN 21 8 - 23 mg/dL   Creatinine, Ser 1.30 (H) 0.61 - 1.24 mg/dL   Calcium 8.2 (L) 8.9 - 10.3 mg/dL   GFR, Estimated 50 (L) >60 mL/min   Anion gap 12 5 - 15  Type and screen  Result Value Ref Range   ABO/RH(D) PENDING    Antibody Screen PENDING    Sample Expiration      01/26/2023,2359 Performed at [facility], [address] [place], [place] [address]     EKG EKG Interpretation Date/Time:  Wednesday January 23 2023 13:09:34 EDT Ventricular Rate:  59 PR  Interval:  210 QRS Duration:  122 QT Interval:  505 QTC Calculation: 501 R Axis:   101  Text Interpretation: Sinus or ectopic atrial rhythm Non-specific intra-ventricular conduction block Non-specific ST-t changes Confirmed by Cathren Laine [phone]) on 01/23/2023 1:51:59 PM  Radiology No results found.  Procedures Procedures    Medications Ordered in ED Medications  oxyCODONE-acetaminophen (PERCOCET/ROXICET) 5-325 MG per tablet 1 tablet (has no administration in time range)  potassium chloride SA (KLOR-CON M) CR tablet 40 mEq (has no administration in time range)  oxyCODONE-acetaminophen (PERCOCET/ROXICET) 5-325 MG per tablet 1 tablet (1 tablet Oral Given 01/23/23 1439)  ondansetron (ZOFRAN-ODT) disintegrating tablet 8 mg (8 mg Oral Given 01/23/23 1440)    ED Course/ Medical Decision Making/ A&P                                 Medical Decision Making Problems Addressed: Anemia, chronic disease: chronic illness or injury with exacerbation, progression, or side effects of treatment that poses a threat to life or bodily functions Chronic anemia: chronic illness or injury with exacerbation, progression, or side effects of treatment that poses a threat to life or bodily functions Hypokalemia: acute illness or injury Other iron deficiency anemia: acute illness or injury with systemic symptoms that poses a threat to life or bodily functions    Details: Acute/chronic Stage 3b chronic kidney disease (HCC): chronic illness or injury with exacerbation, progression, or side effects of treatment that poses a threat to life or bodily functions  Amount and/or Complexity of Data Reviewed Independent Historian: EMS    Details: hx External Data Reviewed: labs and notes. Labs: ordered. Decision-making details documented in ED Course. ECG/medicine tests: ordered and independent interpretation performed. Decision-making details documented in ED Course.  Risk OTC drugs. Prescription drug  management. Decision regarding hospitalization.   Iv ns. Continuous pulse ox and cardiac monitoring. Labs ordered/sent.   Differential diagnosis includes symptomatic anemia, gi bleeding, etc. Dispo decision including potential need for admission considered - will get labs and reassess.   Reviewed nursing notes and prior charts for additional history. External reports reviewed. Additional history from: EMS.   Cardiac monitor: sinus rhythm, rate 60.  Labs reviewed/interpreted by me - wbc is normal. Hgb 7.4 which is consistent with labs from a month ago. No black or bloody stool. Vitals normal. No faintness, weakness, or sob. Pt with hx significant fe def anemia/anemia chr disease.  Rec iron therapy, and for SNF/PCP to coordinate anemia treatment and follow up. Currently no indication for emergent transfusion.  Ckd. K mildly low. Kcl po.   Pt requests dose of his normal pain meds - provided. Pt requests d/c, states he feels fine.   Pt currently appears stable for d/c.   Return precautions provided.  Final Clinical Impression(s) / ED Diagnoses Final diagnoses:  Chronic anemia  Other iron deficiency anemia  Anemia, chronic disease  Stage 3b chronic kidney disease (HCC)  Hypokalemia    Rx / DC Orders ED Discharge Orders          Ordered    ferrous sulfate 325 (65 FE) MG tablet  3 times daily with meals        01/23/23 1505              Cathren Laine, MD 01/23/23 1510

## 2023-01-23 NOTE — Discharge Instructions (Addendum)
It was our pleasure to provide your ER care today - we hope that you feel better.  It appears you have chronic iron deficiency anemia - continue meds/iron, follow up closely with your doctor in the coming week - discuss possible arrangement of outpatient iron infusion therapy and periodic outpatient transfusion if/when needed.   Also from today's labs, your potassium level is mildly low - eat plenty of fruits and vegetables, and follow up with primary care doctor.   Return to ER if worse, new symptoms, fevers, new/severe pain, chest pain, trouble breathing, weak/fainting, or other concern.

## 2023-01-23 NOTE — ED Triage Notes (Signed)
Pt BIB [facility] from [facility] due to blood work.  Hemoglobin reported at 7.  Right leg amputee.  VS SpO2 93% Nasal cannula 2L.  Hx COPD

## 2023-01-23 NOTE — ED Notes (Signed)
Pt right leg amputated

## 2023-01-23 NOTE — ED Notes (Signed)
Report attempted to [facility] x 2

## 2023-01-23 NOTE — ED Notes (Signed)
[id number]  please update daugter

## 2023-01-29 ENCOUNTER — Ambulatory Visit (HOSPITAL_BASED_OUTPATIENT_CLINIC_OR_DEPARTMENT_OTHER): Payer: Medicaid Other | Admitting: Internal Medicine

## 2023-01-31 ENCOUNTER — Ambulatory Visit (HOSPITAL_BASED_OUTPATIENT_CLINIC_OR_DEPARTMENT_OTHER): Payer: Medicaid Other | Admitting: Internal Medicine

## 2023-02-01 ENCOUNTER — Ambulatory Visit (HOSPITAL_COMMUNITY): Payer: Medicaid Other | Admitting: Nurse Practitioner

## 2023-02-08 ENCOUNTER — Emergency Department (HOSPITAL_COMMUNITY): Payer: Medicaid Other

## 2023-02-08 ENCOUNTER — Encounter (HOSPITAL_COMMUNITY): Payer: Self-pay

## 2023-02-08 ENCOUNTER — Inpatient Hospital Stay (HOSPITAL_COMMUNITY)
Admission: EM | Admit: 2023-02-08 | Discharge: 2023-02-09 | DRG: 920 | Disposition: A | Discharge status: Skilled Nursing Facility | Payer: Medicaid Other | Source: Skilled Nursing Facility | Attending: Family Medicine | Admitting: Family Medicine

## 2023-02-08 ENCOUNTER — Other Ambulatory Visit: Payer: Self-pay

## 2023-02-08 DIAGNOSIS — Z89511 Acquired absence of right leg below knee: Secondary | ICD-10-CM | POA: Diagnosis not present

## 2023-02-08 DIAGNOSIS — K91872 Postprocedural seroma of a digestive system organ or structure following a digestive system procedure: Principal | ICD-10-CM | POA: Diagnosis present

## 2023-02-08 DIAGNOSIS — K6289 Other specified diseases of anus and rectum: Secondary | ICD-10-CM | POA: Diagnosis present

## 2023-02-08 DIAGNOSIS — E876 Hypokalemia: Secondary | ICD-10-CM

## 2023-02-08 DIAGNOSIS — E1149 Type 2 diabetes mellitus with other diabetic neurological complication: Secondary | ICD-10-CM | POA: Diagnosis present

## 2023-02-08 DIAGNOSIS — K91870 Postprocedural hematoma of a digestive system organ or structure following a digestive system procedure: Secondary | ICD-10-CM | POA: Diagnosis present

## 2023-02-08 DIAGNOSIS — Z85828 Personal history of other malignant neoplasm of skin: Secondary | ICD-10-CM

## 2023-02-08 DIAGNOSIS — Z79899 Other long term (current) drug therapy: Secondary | ICD-10-CM

## 2023-02-08 DIAGNOSIS — J449 Chronic obstructive pulmonary disease, unspecified: Secondary | ICD-10-CM | POA: Diagnosis present

## 2023-02-08 DIAGNOSIS — Z7985 Long-term (current) use of injectable non-insulin antidiabetic drugs: Secondary | ICD-10-CM

## 2023-02-08 DIAGNOSIS — Z833 Family history of diabetes mellitus: Secondary | ICD-10-CM

## 2023-02-08 DIAGNOSIS — R410 Disorientation, unspecified: Secondary | ICD-10-CM | POA: Diagnosis present

## 2023-02-08 DIAGNOSIS — Y838 Other surgical procedures as the cause of abnormal reaction of the patient, or of later complication, without mention of misadventure at the time of the procedure: Secondary | ICD-10-CM | POA: Diagnosis present

## 2023-02-08 DIAGNOSIS — Z9981 Dependence on supplemental oxygen: Secondary | ICD-10-CM

## 2023-02-08 DIAGNOSIS — Z8616 Personal history of COVID-19: Secondary | ICD-10-CM

## 2023-02-08 DIAGNOSIS — R339 Retention of urine, unspecified: Secondary | ICD-10-CM | POA: Diagnosis present

## 2023-02-08 DIAGNOSIS — Z8249 Family history of ischemic heart disease and other diseases of the circulatory system: Secondary | ICD-10-CM

## 2023-02-08 DIAGNOSIS — D649 Anemia, unspecified: Secondary | ICD-10-CM | POA: Diagnosis not present

## 2023-02-08 DIAGNOSIS — R4182 Altered mental status, unspecified: Secondary | ICD-10-CM

## 2023-02-08 DIAGNOSIS — N1832 Chronic kidney disease, stage 3b: Secondary | ICD-10-CM | POA: Diagnosis present

## 2023-02-08 DIAGNOSIS — Z1152 Encounter for screening for COVID-19: Secondary | ICD-10-CM

## 2023-02-08 DIAGNOSIS — F17210 Nicotine dependence, cigarettes, uncomplicated: Secondary | ICD-10-CM | POA: Diagnosis present

## 2023-02-08 DIAGNOSIS — Z85820 Personal history of malignant melanoma of skin: Secondary | ICD-10-CM

## 2023-02-08 DIAGNOSIS — Z933 Colostomy status: Secondary | ICD-10-CM | POA: Diagnosis not present

## 2023-02-08 DIAGNOSIS — I130 Hypertensive heart and chronic kidney disease with heart failure and stage 1 through stage 4 chronic kidney disease, or unspecified chronic kidney disease: Secondary | ICD-10-CM | POA: Diagnosis present

## 2023-02-08 DIAGNOSIS — Z794 Long term (current) use of insulin: Secondary | ICD-10-CM

## 2023-02-08 DIAGNOSIS — Z8042 Family history of malignant neoplasm of prostate: Secondary | ICD-10-CM

## 2023-02-08 DIAGNOSIS — G894 Chronic pain syndrome: Secondary | ICD-10-CM

## 2023-02-08 DIAGNOSIS — Z9049 Acquired absence of other specified parts of digestive tract: Secondary | ICD-10-CM

## 2023-02-08 DIAGNOSIS — F32A Depression, unspecified: Secondary | ICD-10-CM | POA: Diagnosis present

## 2023-02-08 DIAGNOSIS — Z89412 Acquired absence of left great toe: Secondary | ICD-10-CM

## 2023-02-08 DIAGNOSIS — K219 Gastro-esophageal reflux disease without esophagitis: Secondary | ICD-10-CM | POA: Diagnosis present

## 2023-02-08 DIAGNOSIS — F419 Anxiety disorder, unspecified: Secondary | ICD-10-CM | POA: Diagnosis present

## 2023-02-08 DIAGNOSIS — K651 Peritoneal abscess: Secondary | ICD-10-CM | POA: Diagnosis not present

## 2023-02-08 DIAGNOSIS — R198 Other specified symptoms and signs involving the digestive system and abdomen: Secondary | ICD-10-CM

## 2023-02-08 DIAGNOSIS — N319 Neuromuscular dysfunction of bladder, unspecified: Secondary | ICD-10-CM | POA: Diagnosis present

## 2023-02-08 DIAGNOSIS — I5022 Chronic systolic (congestive) heart failure: Secondary | ICD-10-CM | POA: Diagnosis present

## 2023-02-08 DIAGNOSIS — E785 Hyperlipidemia, unspecified: Secondary | ICD-10-CM | POA: Diagnosis present

## 2023-02-08 DIAGNOSIS — Z7982 Long term (current) use of aspirin: Secondary | ICD-10-CM

## 2023-02-08 DIAGNOSIS — N401 Enlarged prostate with lower urinary tract symptoms: Secondary | ICD-10-CM

## 2023-02-08 DIAGNOSIS — I480 Paroxysmal atrial fibrillation: Secondary | ICD-10-CM | POA: Diagnosis present

## 2023-02-08 DIAGNOSIS — D631 Anemia in chronic kidney disease: Secondary | ICD-10-CM | POA: Diagnosis present

## 2023-02-08 DIAGNOSIS — K559 Vascular disorder of intestine, unspecified: Secondary | ICD-10-CM | POA: Diagnosis present

## 2023-02-08 DIAGNOSIS — E1122 Type 2 diabetes mellitus with diabetic chronic kidney disease: Secondary | ICD-10-CM | POA: Diagnosis present

## 2023-02-08 DIAGNOSIS — Z79891 Long term (current) use of opiate analgesic: Secondary | ICD-10-CM

## 2023-02-08 DIAGNOSIS — R451 Restlessness and agitation: Secondary | ICD-10-CM | POA: Diagnosis present

## 2023-02-08 DIAGNOSIS — Z8673 Personal history of transient ischemic attack (TIA), and cerebral infarction without residual deficits: Secondary | ICD-10-CM

## 2023-02-08 LAB — I-STAT CG4 LACTIC ACID, ED
Lactic Acid, Venous: 0.9 mmol/L (ref 0.5–1.9)
Lactic Acid, Venous: 0.3 mmol/L — ABNORMAL LOW (ref 0.5–1.9)

## 2023-02-08 LAB — CBC WITH DIFFERENTIAL/PLATELET
Abs Immature Granulocytes: 0.06 10*3/uL (ref 0.00–0.07)
Basophils Absolute: 0.1 10*3/uL (ref 0.0–0.1)
Basophils Relative: 1 %
Eosinophils Absolute: 0.3 10*3/uL (ref 0.0–0.5)
Eosinophils Relative: 3 %
HCT: 27.9 % — ABNORMAL LOW (ref 39.0–52.0)
Hemoglobin: 8.2 g/dL — ABNORMAL LOW (ref 13.0–17.0)
Immature Granulocytes: 1 %
Lymphocytes Relative: 17 %
Lymphs Abs: 1.4 10*3/uL (ref 0.7–4.0)
MCH: 27.2 pg (ref 26.0–34.0)
MCHC: 29.4 g/dL — ABNORMAL LOW (ref 30.0–36.0)
MCV: 92.4 fL (ref 80.0–100.0)
Monocytes Absolute: 0.4 10*3/uL (ref 0.1–1.0)
Monocytes Relative: 6 %
Neutro Abs: 5.8 10*3/uL (ref 1.7–7.7)
Neutrophils Relative %: 72 %
Platelets: 307 10*3/uL (ref 150–400)
RBC: 3.02 MIL/uL — ABNORMAL LOW (ref 4.22–5.81)
RDW: 16.5 % — ABNORMAL HIGH (ref 11.5–15.5)
WBC: 8 10*3/uL (ref 4.0–10.5)
nRBC: 0 % (ref 0.0–0.2)

## 2023-02-08 LAB — COMPREHENSIVE METABOLIC PANEL
ALT: 11 U/L (ref 0–44)
AST: 14 U/L — ABNORMAL LOW (ref 15–41)
Albumin: 2.2 g/dL — ABNORMAL LOW (ref 3.5–5.0)
Alkaline Phosphatase: 114 U/L (ref 38–126)
Anion gap: 10 (ref 5–15)
BUN: 26 mg/dL — ABNORMAL HIGH (ref 8–23)
CO2: 25 mmol/L (ref 22–32)
Calcium: 8.6 mg/dL — ABNORMAL LOW (ref 8.9–10.3)
Chloride: 103 mmol/L (ref 98–111)
Creatinine, Ser: 1.91 mg/dL — ABNORMAL HIGH (ref 0.61–1.24)
GFR, Estimated: 39 mL/min — ABNORMAL LOW (ref >60)
Glucose, Bld: 145 mg/dL — ABNORMAL HIGH (ref 70–99)
Potassium: 3.3 mmol/L — ABNORMAL LOW (ref 3.5–5.1)
Sodium: 138 mmol/L (ref 135–145)
Total Bilirubin: 0.6 mg/dL (ref 0.3–1.2)
Total Protein: 7.6 g/dL (ref 6.5–8.1)

## 2023-02-08 LAB — GLUCOSE, CAPILLARY: Glucose-Capillary: 112 mg/dL — ABNORMAL HIGH (ref 70–99)

## 2023-02-08 LAB — LIPASE, BLOOD: Lipase: 19 U/L (ref 11–51)

## 2023-02-08 LAB — SARS CORONAVIRUS 2 BY RT PCR: SARS Coronavirus 2 by RT PCR: NEGATIVE

## 2023-02-08 MED ORDER — HYDROMORPHONE HCL 1 MG/ML IJ SOLN
0.5000 mg | INTRAMUSCULAR | Status: DC | PRN
  Administered 2023-02-09: 0.5 mg via INTRAVENOUS
  Filled 2023-02-08: qty 0.5

## 2023-02-08 MED ORDER — TAMSULOSIN HCL 0.4 MG PO CAPS
0.4000 mg | ORAL_CAPSULE | Freq: Every day | ORAL | Status: DC
  Administered 2023-02-08 – 2023-02-09 (×2): 0.4 mg via ORAL
  Filled 2023-02-08 (×2): qty 1

## 2023-02-08 MED ORDER — SODIUM CHLORIDE 0.9 % IV BOLUS
500.0000 mL | Freq: Once | INTRAVENOUS | Status: AC
  Administered 2023-02-08: 500 mL via INTRAVENOUS

## 2023-02-08 MED ORDER — LOPERAMIDE HCL 2 MG PO CAPS
2.0000 mg | ORAL_CAPSULE | Freq: Two times a day (BID) | ORAL | Status: DC
  Administered 2023-02-08 – 2023-02-09 (×2): 2 mg via ORAL
  Filled 2023-02-08 (×2): qty 1

## 2023-02-08 MED ORDER — ATORVASTATIN CALCIUM 80 MG PO TABS
80.0000 mg | ORAL_TABLET | Freq: Every day | ORAL | Status: DC
  Administered 2023-02-08 – 2023-02-09 (×2): 80 mg via ORAL
  Filled 2023-02-08 (×2): qty 1

## 2023-02-08 MED ORDER — IOHEXOL 350 MG/ML SOLN
60.0000 mL | Freq: Once | INTRAVENOUS | Status: AC | PRN
  Administered 2023-02-08: 60 mL via INTRAVENOUS

## 2023-02-08 MED ORDER — ACETAMINOPHEN 325 MG PO TABS
650.0000 mg | ORAL_TABLET | Freq: Four times a day (QID) | ORAL | Status: DC
  Administered 2023-02-08 – 2023-02-09 (×3): 650 mg via ORAL
  Filled 2023-02-08 (×3): qty 2

## 2023-02-08 MED ORDER — URELLE 81 MG PO TABS
1.0000 | ORAL_TABLET | Freq: Two times a day (BID) | ORAL | Status: DC
  Administered 2023-02-09: 81 mg via ORAL
  Filled 2023-02-08 (×2): qty 1

## 2023-02-08 MED ORDER — CARVEDILOL 12.5 MG PO TABS: 12.5000 mg | ORAL_TABLET | Freq: Two times a day (BID) | ORAL | Status: DC

## 2023-02-08 MED ORDER — DIAZEPAM 5 MG/ML IJ SOLN
5.0000 mg | Freq: Once | INTRAMUSCULAR | Status: AC
  Administered 2023-02-08: 5 mg via INTRAVENOUS
  Filled 2023-02-08: qty 2

## 2023-02-08 MED ORDER — MELATONIN 5 MG PO TABS
10.0000 mg | ORAL_TABLET | Freq: Every day | ORAL | Status: DC
  Administered 2023-02-08: 10 mg via ORAL
  Filled 2023-02-08: qty 2

## 2023-02-08 MED ORDER — ESCITALOPRAM OXALATE 10 MG PO TABS
20.0000 mg | ORAL_TABLET | Freq: Every day | ORAL | Status: DC
  Administered 2023-02-08 – 2023-02-09 (×2): 20 mg via ORAL
  Filled 2023-02-08 (×2): qty 2

## 2023-02-08 MED ORDER — PREGABALIN 100 MG PO CAPS
100.0000 mg | ORAL_CAPSULE | Freq: Two times a day (BID) | ORAL | Status: DC
  Administered 2023-02-08 – 2023-02-09 (×2): 100 mg via ORAL
  Filled 2023-02-08 (×2): qty 1

## 2023-02-08 MED ORDER — SODIUM BICARBONATE 650 MG PO TABS
650.0000 mg | ORAL_TABLET | Freq: Two times a day (BID) | ORAL | Status: DC
  Administered 2023-02-08 – 2023-02-09 (×2): 650 mg via ORAL
  Filled 2023-02-08 (×2): qty 1

## 2023-02-08 MED ORDER — PIPERACILLIN-TAZOBACTAM 3.375 G IVPB 30 MIN
3.3750 g | Freq: Once | INTRAVENOUS | Status: AC
  Administered 2023-02-08: 3.375 g via INTRAVENOUS
  Filled 2023-02-08: qty 50

## 2023-02-08 MED ORDER — PANTOPRAZOLE SODIUM 20 MG PO TBEC
20.0000 mg | DELAYED_RELEASE_TABLET | Freq: Every day | ORAL | Status: DC
  Administered 2023-02-08 – 2023-02-09 (×2): 20 mg via ORAL
  Filled 2023-02-08 (×2): qty 1

## 2023-02-08 MED ORDER — MIRABEGRON ER 25 MG PO TB24
50.0000 mg | ORAL_TABLET | Freq: Every day | ORAL | Status: DC
  Administered 2023-02-08 – 2023-02-09 (×2): 50 mg via ORAL
  Filled 2023-02-08: qty 1
  Filled 2023-02-08 (×2): qty 2

## 2023-02-08 MED ORDER — HYDRALAZINE HCL 10 MG PO TABS
10.0000 mg | ORAL_TABLET | Freq: Three times a day (TID) | ORAL | Status: DC
  Administered 2023-02-08: 10 mg via ORAL
  Filled 2023-02-08: qty 1

## 2023-02-08 MED ORDER — HYDROXYZINE HCL 50 MG PO TABS
50.0000 mg | ORAL_TABLET | Freq: Every day | ORAL | Status: DC
  Administered 2023-02-08: 50 mg via ORAL
  Filled 2023-02-08 (×3): qty 1

## 2023-02-08 MED ORDER — INSULIN ASPART 100 UNIT/ML IJ SOLN
0.0000 [IU] | Freq: Three times a day (TID) | INTRAMUSCULAR | Status: DC
  Administered 2023-02-09: 1 [IU] via SUBCUTANEOUS

## 2023-02-08 MED ORDER — QUETIAPINE FUMARATE 100 MG PO TABS
100.0000 mg | ORAL_TABLET | Freq: Every day | ORAL | Status: DC
  Administered 2023-02-08: 100 mg via ORAL
  Filled 2023-02-08: qty 1

## 2023-02-08 MED ORDER — MORPHINE SULFATE (PF) 4 MG/ML IV SOLN
4.0000 mg | Freq: Once | INTRAVENOUS | Status: AC
  Administered 2023-02-08: 4 mg via INTRAVENOUS
  Filled 2023-02-08: qty 1

## 2023-02-08 MED ORDER — ONDANSETRON HCL 4 MG/2ML IJ SOLN
4.0000 mg | Freq: Once | INTRAMUSCULAR | Status: AC
  Administered 2023-02-08: 4 mg via INTRAVENOUS
  Filled 2023-02-08: qty 2

## 2023-02-08 MED ORDER — ONDANSETRON HCL 4 MG PO TABS: 4.0000 mg | ORAL_TABLET | Freq: Four times a day (QID) | ORAL | Status: DC | PRN

## 2023-02-08 MED ORDER — OXYCODONE HCL 5 MG PO TABS
5.0000 mg | ORAL_TABLET | ORAL | Status: DC | PRN
  Administered 2023-02-08 – 2023-02-09 (×5): 5 mg via ORAL
  Filled 2023-02-08 (×6): qty 1

## 2023-02-08 MED ORDER — ENOXAPARIN SODIUM 60 MG/0.6ML IJ SOSY
50.0000 mg | PREFILLED_SYRINGE | INTRAMUSCULAR | Status: DC
  Administered 2023-02-08 – 2023-02-09 (×2): 50 mg via SUBCUTANEOUS
  Filled 2023-02-08 (×2): qty 0.6

## 2023-02-08 MED ORDER — ONDANSETRON HCL 4 MG/2ML IJ SOLN
4.0000 mg | Freq: Four times a day (QID) | INTRAMUSCULAR | Status: DC | PRN
  Administered 2023-02-09: 4 mg via INTRAVENOUS
  Filled 2023-02-08: qty 2

## 2023-02-08 MED ORDER — ASPIRIN 81 MG PO TBEC
81.0000 mg | DELAYED_RELEASE_TABLET | Freq: Every day | ORAL | Status: DC
  Administered 2023-02-08 – 2023-02-09 (×2): 81 mg via ORAL
  Filled 2023-02-08 (×2): qty 1

## 2023-02-08 NOTE — ED Notes (Signed)
Received osotomy supplies and attempted to re-apply with Raymar, RN.. pt screamed at [place] and said we were "poisoning me" and refused having the ostomy bag reapplied.   Pt continues to scream now for "Aundra Millet" He is continuing to scream for water even when provided.

## 2023-02-08 NOTE — Progress Notes (Signed)
Interventional Radiology Brief Note:  Gregory [name] is a 62 year old male admitted to [facility] ED with nausea, vomiting, green stool via ostomy, and agitation. CT Abdomen Pelvis shows a small perisplenic fluid collection.  No overt indications of infection with regard to collection.  In addition, patient is stable.  VSS.  No elevated WBC, no fever.  Lactic acid normal.   Case reviewed by Dr. Miles Costain who recommends following clinically and treating conservatively.  If worsens or additional imaging shows concern for abscess, could aspirate vs. Drain.   Ordering service made aware.   Loyce Dys, MS RD [name]

## 2023-02-08 NOTE — ED Provider Notes (Signed)
I assumed care of the patient at 1500.  Briefly the patient was awaiting admission I was notified that general surgery had evaluated the patient and felt okay for discharge if he was able to tolerate the oral trial.  Will attempt oral trial now.  Tolerating liquids without issue will attempt solids.  Patient was only able to tolerate a bite or 2 of the sandwich.  Refusing all further intake.  Patient's daughter had called and stated that he is very confused from his baseline.  Discussed with family medicine for admission.   Melene Plan, DO 02/08/23 [phone]

## 2023-02-08 NOTE — ED Triage Notes (Signed)
Pt arrives via EMS from [facility]. Pt reports Abdominal pain, nausea, and vomiting since this morning. Pt is AxOx4.

## 2023-02-08 NOTE — ED Notes (Signed)
Floor called and made aware of pt's soon arrival.

## 2023-02-08 NOTE — ED Notes (Signed)
Pt called [phone] on room phone and screamed for someone to help him because he is "cold"

## 2023-02-08 NOTE — Assessment & Plan Note (Deleted)
Yelling at hospital staff repeatedly and threw ostomy bag. Got valium 5mg  with some improvement in behavior. Confused per daughter. AAOx4 on exam in ED. -UDS pending -home melatonin 10mg  and seroquel 100mg  nightly -holding home gabapentin, hydralazine -call daughter to get a better idea of baseline

## 2023-02-08 NOTE — ED Notes (Signed)
Pt is drinking water and coke without pain. He also took two bites of a [place] sandwich and ate that without pain. Pt offered jello, broth, more sandwich and icee and he will not eat more.

## 2023-02-08 NOTE — Consult Note (Cosign Needed Addendum)
Gregory [name] [birth year]  [id number].    Requesting MD: Dr. Jacalyn Lefevre Chief Complaint/Reason for Consult: Post operative intra-abdominal fluid collection   HPI: Gregory [name] is a 62 y.o. 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 Sheliah Hatch on 12/04/2022 presented to the ED from [facility] with abdominal pain, nausea and vomiting.  Patient underwent workup and was found to have normal wbc, mild wall thickening involving the Hartman's pouch along with 5.4 cm fluid collection between the inferior margin of the spleen and the left anterior abdominal wall that was suspicious for an abscess.  We are asked to see. Patient reports he currently has no abdominal pain. Nausea resolved. He has had ostomy output today but has removed his ostomy pouch since presenting to the ED.   PMHx: CHF, CKD, COPD, IDDM, hypertension, hyperlipidemia, CVA Blood Thinners: Baby ASA  ROS: ROS As above, see hpi  Family History  Problem Relation Age of Onset   Diabetes Mother    Heart disease Mother    Cancer Mother    Cancer Father    Bone cancer Father    Prostate cancer Father    Cancer Paternal Grandfather    Cancer Other    Diabetes Other     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    Chronic pain syndrome    COPD (chronic obstructive pulmonary disease) (HCC)    DDD (degenerative disc disease), lumbar    Depression     major depressive disorder    Diabetes mellitus without complication (HCC)    type 2    Diabetic ulcer of toe of left foot (HCC) 10/02/2016   Difficult intubation    Difficulty in walking, not elsewhere classified    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   Foot drop, bilateral    Foot drop, right 12/27/2015   GERD (gastroesophageal reflux disease)    Herpesviral vesicular dermatitis    History of COVID-19 03/15/2022   Hyperlipidemia    Hyperlipidemia    Hypertension    Insomnia    Malignant melanoma of other parts of face (HCC)    Melanoma (HCC)    MRSA bacteremia    Muscle weakness (generalized)    Nasal congestion    Necrosis of toe (HCC) 07/08/2018   Neuromuscular dysfunction of bladder    Osteomyelitis (HCC)    Osteomyelitis (HCC)    Osteomyelitis (HCC)    right BKA   Other idiopathic peripheral autonomic neuropathy    Retention of urine, unspecified    Squamous cell carcinoma of skin    Stroke (HCC)    Unsteadiness on feet    Urinary retention    Urinary retention    Urinary tract infection    Wears dentures    Wears glasses     Past Surgical  History:  Procedure Laterality Date   AMPUTATION Left 03/29/2017   Procedure: LEFT GREAT TOE AMPUTATION AT METATARSOPHALANGEAL JOINT;  Surgeon: Nadara Mustard, MD;  Location: [facility] OR;  Service: Orthopedics;  Laterality: Left;   AMPUTATION Right 03/29/2017   Procedure: RIGHT BELOW KNEE AMPUTATION;  Surgeon: Nadara Mustard, MD;  Location: [facility] OR;  Service: Orthopedics;  Laterality: Right;   AMPUTATION Left 06/06/2018   Procedure: LEFT 2ND TOE AMPUTATION;  Surgeon: Nadara Mustard, MD;  Location: [facility] OR;  Service: Orthopedics;  Laterality: Left;   AMPUTATION Right 11/19/2018   Procedure: AMPUTATION ABOVE KNEE;  Surgeon: Nadara Mustard, MD;  Location: [facility] OR;  Service: Orthopedics;  Laterality: Right;   ANTERIOR CERVICAL CORPECTOMY N/A 11/25/2015   Procedure: ANTERIOR CERVICAL FIVE CORPECTOMY Cervical four - six fusion;  Surgeon: Lisbeth Renshaw,  MD;  Location: [facility];  Service: Neurosurgery;  Laterality: N/A;  ANTERIOR CERVICAL FIVE CORPECTOMY Cervical four - six fusion   APPLICATION OF WOUND VAC Right 10/22/2018   Procedure: Application Of Wound Vac;  Surgeon: Nadara Mustard, MD;  Location: [facility] OR;  Service: Orthopedics;  Laterality: Right;   CYSTOSCOPY WITH BIOPSY N/A 05/01/2022   Procedure: CYSTOSCOPY WITH URETHRAL BIOPSY;  Surgeon: Noel Christmas, MD;  Location: [facility] ORS;  Service: Urology;  Laterality: N/A;  1 HR   CYSTOSCOPY WITH RETROGRADE URETHROGRAM N/A 04/25/2018   Procedure: CYSTOSCOPY WITH RETROGRADE URETHROGRAM/ BALLOON DILATION;  Surgeon: Ihor Gully, MD;  Location: [facility] ORS;  Service: Urology;  Laterality: N/A;   CYSTOSCOPY WITH URETHRAL DILATATION N/A 05/01/2022   Procedure: BALLOON DILATION WITH  OPTILUME;  Surgeon: Noel Christmas, MD;  Location: [facility] ORS;  Service: Urology;  Laterality: N/A;   LAPAROTOMY N/A 12/03/2022   Procedure: EXPLORATORY LAPAROTOMY  total abdominal colectomy with end ileostomy;  Surgeon: Kinsinger, De Blanch, MD;  Location: [facility] OR;  Service: General;  Laterality: N/A;   MULTIPLE TOOTH EXTRACTIONS     POSTERIOR CERVICAL FUSION/FORAMINOTOMY N/A 11/29/2015   Procedure: Cervical Three-Cervical Seven Posterior Cervical Laminectomy with Fusion;  Surgeon: Lisbeth Renshaw, MD;  Location: [facility];  Service: Neurosurgery;  Laterality: N/A;  Cervical Three-Cervical Seven Posterior Cervical Laminectomy with Fusion   STUMP REVISION Right 10/22/2018   Procedure: REVISION RIGHT BELOW KNEE AMPUTATION;  Surgeon: Nadara Mustard, MD;  Location: [facility] OR;  Service: Orthopedics;  Laterality: Right;   STUMP REVISION Right 12/05/2018   Procedure: Revision Right Above Knee Amputation;  Surgeon: Nadara Mustard, MD;  Location: [facility] OR;  Service: Orthopedics;  Laterality: Right;   STUMP REVISION Right 05/29/2019   Procedure: REVISION RIGHT ABOVE KNEE AMPUTATION;  Surgeon: Nadara Mustard, MD;  Location: [facility] OR;  Service:  Orthopedics;  Laterality: Right;   STUMP REVISION Right 06/24/2019   Procedure: STUMP REVISION;  Surgeon: Nadara Mustard, MD;  Location: [facility] OR;  Service: Orthopedics;  Laterality: Right;    Social History:  reports that he has been smoking cigarettes. He has never used smokeless tobacco. He reports that he does not drink alcohol and does not use drugs.  Allergies:  Allergies  Allergen Reactions   Latex Other (See Comments)    "ALLERGIC," per [facility]    (Not in a hospital admission)    Physical Exam: Blood pressure (!) 152/103, pulse 96, temperature 99 F (37.2 C), temperature source Oral, resp. rate 17, height 6' (1.829 m), weight 104.3 kg, SpO2 98%. Gen:  Alert, NAD, pleasant Card:  Reg rate Pulm:  Rate and effort normal Abd:  Soft, ND, NT, stoma viable - no ostomy bag in place - I have asked RN to replace. Midline wound clean.  Ext:  R AKA  Results for orders placed or performed during the hospital encounter of 02/08/23 (from the past 48 hour(s))  CBC with Differential     Status: Abnormal   Collection Time: 02/08/23 11:01 AM  Result Value Ref Range   WBC 8.0 4.0 - 10.5 K/uL   RBC 3.02 (L) 4.22 - 5.81 MIL/uL   Hemoglobin 8.2 (L) 13.0 - 17.0 g/dL   HCT 16.1 (L) 09.6 - 04.5 %   MCV 92.4 80.0 - 100.0 fL   MCH 27.2 26.0 - 34.0 pg   MCHC 29.4 (L) 30.0 - 36.0 g/dL   RDW 40.9 (H) 81.1 - 91.4 %   Platelets 307 150 - 400 K/uL   nRBC 0.0 0.0 - 0.2 %   Neutrophils Relative % 72 %   Neutro Abs 5.8 1.7 - 7.7 K/uL   Lymphocytes Relative 17 %   Lymphs Abs 1.4 0.7 - 4.0 K/uL   Monocytes Relative 6 %   Monocytes Absolute 0.4 0.1 - 1.0 K/uL   Eosinophils Relative 3 %   Eosinophils Absolute 0.3 0.0 - 0.5 K/uL   Basophils Relative 1 %   Basophils Absolute 0.1 0.0 - 0.1 K/uL   Immature Granulocytes 1 %   Abs Immature Granulocytes 0.06 0.00 - 0.07 K/uL    Comment: Performed at [facility], [address] [place], [place] [address]  Comprehensive metabolic panel     Status: Abnormal    Collection Time: 02/08/23 11:01 AM  Result Value Ref Range   Sodium 138 135 - 145 mmol/L   Potassium 3.3 (L) 3.5 - 5.1 mmol/L   Chloride 103 98 - 111 mmol/L   CO2 25 22 - 32 mmol/L   Glucose, Bld 145 (H) 70 - 99 mg/dL    Comment: Glucose reference range applies only to samples taken after fasting for at least 8 hours.   BUN 26 (H) 8 - 23 mg/dL   Creatinine, Ser 6.21 (H) 0.61 - 1.24 mg/dL   Calcium 8.6 (L) 8.9 - 10.3 mg/dL   Total Protein 7.6 6.5 - 8.1 g/dL   Albumin 2.2 (L) 3.5 - 5.0 g/dL   AST 14 (L) 15 - 41 U/L   ALT 11 0 - 44 U/L   Alkaline Phosphatase 114 38 - 126 U/L   Total Bilirubin 0.6 0.3 - 1.2 mg/dL   GFR, Estimated 39 (L) >60 mL/min    Comment: (NOTE) Calculated using the CKD-EPI Creatinine Equation (2021)    Anion gap 10 5 - 15    Comment: Performed at [facility], [address] [place], [place] [address]  Lipase, blood     Status: None   Collection Time: 02/08/23 11:01 AM  Result Value Ref Range   Lipase 19 11 - 51 U/L    Comment: Performed at [facility], [address] [place], [place] [address]  I-Stat CG4 Lactic Acid     Status: None   Collection Time: 02/08/23 11:23 AM  Result Value Ref Range   Lactic Acid, Venous 0.9 0.5 - 1.9 mmol/L  SARS Coronavirus 2 by RT PCR (hospital order, performed in [facility] hospital lab) *cepheid single result test* Anterior Nasal Swab     Status: None   Collection Time: 02/08/23 11:24 AM   Specimen: Anterior Nasal Swab  Result Value Ref Range   SARS  Coronavirus 2 by RT PCR NEGATIVE NEGATIVE    Comment: Performed at [facility], [address] [place], [place] [address]  I-Stat CG4 Lactic Acid     Status: Abnormal   Collection Time: 02/08/23  1:51 PM  Result Value Ref Range   Lactic Acid, Venous 0.3 (L) 0.5 - 1.9 mmol/L   CT ABDOMEN PELVIS W CONTRAST  Result Date: 02/08/2023 CLINICAL DATA:  Abdominal pain. Nausea and vomiting. Two months postop from total colectomy with end ileostomy. EXAM: CT  ABDOMEN AND PELVIS WITH CONTRAST TECHNIQUE: Multidetector CT imaging of the abdomen and pelvis was performed using the standard protocol following bolus administration of intravenous contrast. RADIATION DOSE REDUCTION: This exam was performed according to the departmental dose-optimization program which includes automated exposure control, adjustment of the mA and/or kV according to patient size and/or use of iterative reconstruction technique. CONTRAST:  60mL OMNIPAQUE IOHEXOL 350 MG/ML SOLN COMPARISON:  12/03/2022 FINDINGS: Lower Chest: Tiny left pleural effusion and mild bibasilar atelectasis. Hepatobiliary: No suspicious hepatic masses identified. Gallstones are seen, however there is no evidence of cholecystitis or biliary dilatation. Pancreas:  No mass or inflammatory changes. Spleen: Within normal limits in size and appearance. Adrenals/Urinary Tract: No suspicious masses identified. No evidence of ureteral calculi or hydronephrosis. Stomach/Bowel: Patient has undergone total colectomy and right abdominal ileostomy since prior study. Generalized mesenteric edema is seen. No No evidence of bowel obstruction. Mild wall thickening is seen involving the [name], consistent with proctitis. A small left abdominal fluid collection is seen between the inferior margin of the spleen and anterior abdominal wall which measures 5.4 x 2.1 cm on image 37/3. This is suspicious for abscess. Vascular/Lymphatic: No pathologically enlarged lymph nodes. No acute vascular findings. Reproductive:  No mass or other significant abnormality. Other:  None. Musculoskeletal: No suspicious bone lesions identified. Left posterior 10th rib fracture is unchanged since previous study. 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 Mild wall thickening involving the [name], consistent with proctitis. 5.4 cm fluid collection between the inferior margin of the spleen and left anterior abdominal  wall, suspicious for abscess. Cholelithiasis. No radiographic evidence of cholecystitis. Unchanged left posterior 10th rib fracture. Electronically Signed   By: Danae Orleans M.D.   On: 02/08/2023 14:33   DG Chest Portable 1 View  Result Date: 02/08/2023 CLINICAL DATA:  Nausea and vomiting EXAM: PORTABLE CHEST 1 VIEW COMPARISON:  X-ray 12/19/2022 and older FINDINGS: Underinflation. Enlarged cardiopericardial silhouette with tortuous and ectatic aorta. Bronchovascular crowding. The opacity seen in the left mid to lower lung could be related to level of inflation. No pneumothorax or effusion. The inferior costophrenic angles are clipped off the edge of the film. IMPRESSION: Underinflation with bronchovascular crowding. Slightly more opacity left mid to lower lung. This could be related to level of inflation but an infiltrates not excluded. Follow up x-ray may be useful with improved inflation as clinically appropriate or short-term follow-up Electronically Signed   By: Karen Kays M.D.   On: 02/08/2023 12:48    Anti-infectives (From admission, onward)    Start     Dose/Rate Route Frequency Ordered Stop   02/08/23 1515  piperacillin-tazobactam (ZOSYN) IVPB 3.375 g        3.375 g 100 mL/hr over 30 Minutes Intravenous  Once 02/08/23 1506         Assessment/Plan CORKY LEDEZMA is a 62 y.o. 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 Sheliah Hatch on 12/04/2022 who was found to have a 5.4 cm fluid collection between the inferior margin of the spleen and the left anterior abdominal wall that was suspicious for an abscess on CT. His wbc is wnl and he is completely NT. No indication for emergency surgery. He is already getting admitted to the hospital. I think it would be reasonable to review imaging with IR to see if this is amenable to aspiration or drainage. If not amenable to procedure or after aspiration/drainage is performed he can be  placed on liquid diet with advance diet as tolerated order. If he continues to have no abdominal pain, is NT on exam and WBC is wnl in the am with reassuring vitals he may be able to be d/c'd as early as tomorrow. Cont abx. Daily WTD to midline wound. WOCN consult for ostomy care. We will follow with you.   FEN - NPO for IR eval, IVF per primary  VTE - SCDs, okay for chemical ppx from a general surgery standpoint - please check with IR before initating if they are planning a procedure  ID - Zosyn  Addendum: Discussed with IR. Not amenable to IR drainage. Reviewed with MD. Given he is NT and with normal WBC will give PO trial. If patient tolerates PO trial without any abdominal pain, n/v he can be dc'd on course of oral abx. Will reach out to primary team to discuss. Patient should follow up with our team in the office.   I reviewed nursing notes, last 24 h vitals and pain scores, last 48 h intake and output, last 24 h labs and trends, and last 24 h imaging results.  Jacinto Halim, [facility] 02/08/2023, 3:29 PM Please see Amion for pager number during day hours 7:00am-4:30pm

## 2023-02-08 NOTE — ED Notes (Signed)
Wash cloth applied to ostomy, pt room cleaned and bedding changed due to stool all over. Pt gown changed. Pt continued to scream for water and then scream for [place] to call his daughter.

## 2023-02-08 NOTE — ED Notes (Signed)
New ostomy bag applied by this paramedic and Raymar, RN.. pt then given more to drink and brief changed.

## 2023-02-08 NOTE — ED Notes (Addendum)
PT is tolerating water well. Able to drink without abdominal pain.

## 2023-02-08 NOTE — ED Notes (Signed)
ED TO INPATIENT HANDOFF REPORT  ED Nurse Name and Phone #: Murlean Iba PM [phone]  S Name/Age/Gender Gregory [name] 62 y.o. male Room/Bed: 034C/034C  Code Status   Code Status: Full Code  Home/SNF/Other Skilled nursing facility Patient oriented to: self, place, and time Is this baseline? Yes   Triage Complete: Triage complete  Chief Complaint Altered mental status [R41.82]  Triage Note Pt arrives via EMS from [facility]. Pt reports Abdominal pain, nausea, and vomiting since this morning. Pt is AxOx4.    Allergies Allergies  Allergen Reactions   Latex Other (See Comments)    "ALLERGIC," per MAR    Level of Care/Admitting Diagnosis ED Disposition     ED Disposition  Admit   Condition  --   Comment  Hospital Area: [facility] [[id number]]  Level of Care: Med-Surg [[id number]]  May admit patient to [facility] or [facility] if equivalent level of care is available:: Yes  Covid Evaluation: Asymptomatic - no recent exposure (last 10 days) testing not required  Diagnosis: Altered mental status [780.97.ICD-9-CM]  Admitting Physician: Lorayne Bender [[id number]]  Attending Physician: Billey Co [[id number]]  Certification:: I certify this patient will need inpatient services for at least 2 midnights  Expected Medical Readiness: 02/10/2023          B Medical/Surgery History 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    Chronic pain syndrome    COPD (chronic obstructive  pulmonary disease) (HCC)    DDD (degenerative disc disease), lumbar    Depression    major depressive disorder    Diabetes mellitus without complication (HCC)    type 2    Diabetic ulcer of toe of left foot (HCC) 10/02/2016   Difficult intubation    Difficulty in walking, not elsewhere classified    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   Foot drop, bilateral    Foot drop, right 12/27/2015   GERD (gastroesophageal reflux disease)    Herpesviral vesicular dermatitis    History of COVID-19 03/15/2022   Hyperlipidemia    Hyperlipidemia    Hypertension    Insomnia    Malignant melanoma of other parts of face (HCC)    Melanoma (HCC)    MRSA bacteremia    Muscle weakness (generalized)    Nasal congestion    Necrosis of toe (HCC) 07/08/2018   Neuromuscular dysfunction of bladder    Osteomyelitis (HCC)    Osteomyelitis (HCC)    Osteomyelitis (HCC)    right BKA   Other idiopathic peripheral autonomic neuropathy    Retention of urine, unspecified    Squamous cell carcinoma of skin    Stroke (HCC)    Unsteadiness on feet    Urinary retention    Urinary retention    Urinary tract infection    Wears dentures    Wears glasses    Past Surgical History:  Procedure  Laterality Date   AMPUTATION Left 03/29/2017   Procedure: LEFT GREAT TOE AMPUTATION AT METATARSOPHALANGEAL JOINT;  Surgeon: Nadara Mustard, MD;  Location: [facility] OR;  Service: Orthopedics;  Laterality: Left;   AMPUTATION Right 03/29/2017   Procedure: RIGHT BELOW KNEE AMPUTATION;  Surgeon: Nadara Mustard, MD;  Location: [facility] OR;  Service: Orthopedics;  Laterality: Right;   AMPUTATION Left 06/06/2018   Procedure: LEFT 2ND TOE AMPUTATION;  Surgeon: Nadara Mustard, MD;  Location: [facility] OR;  Service: Orthopedics;  Laterality: Left;   AMPUTATION Right 11/19/2018   Procedure: AMPUTATION ABOVE KNEE;  Surgeon: Nadara Mustard, MD;  Location: [facility] OR;  Service: Orthopedics;  Laterality: Right;   ANTERIOR CERVICAL CORPECTOMY N/A 11/25/2015   Procedure:  ANTERIOR CERVICAL FIVE CORPECTOMY Cervical four - six fusion;  Surgeon: Lisbeth Renshaw, MD;  Location: [facility];  Service: Neurosurgery;  Laterality: N/A;  ANTERIOR CERVICAL FIVE CORPECTOMY Cervical four - six fusion   APPLICATION OF WOUND VAC Right 10/22/2018   Procedure: Application Of Wound Vac;  Surgeon: Nadara Mustard, MD;  Location: [facility] OR;  Service: Orthopedics;  Laterality: Right;   CYSTOSCOPY WITH BIOPSY N/A 05/01/2022   Procedure: CYSTOSCOPY WITH URETHRAL BIOPSY;  Surgeon: Noel Christmas, MD;  Location: [facility] ORS;  Service: Urology;  Laterality: N/A;  1 HR   CYSTOSCOPY WITH RETROGRADE URETHROGRAM N/A 04/25/2018   Procedure: CYSTOSCOPY WITH RETROGRADE URETHROGRAM/ BALLOON DILATION;  Surgeon: Ihor Gully, MD;  Location: [facility] ORS;  Service: Urology;  Laterality: N/A;   CYSTOSCOPY WITH URETHRAL DILATATION N/A 05/01/2022   Procedure: BALLOON DILATION WITH  OPTILUME;  Surgeon: Noel Christmas, MD;  Location: [facility] ORS;  Service: Urology;  Laterality: N/A;   LAPAROTOMY N/A 12/03/2022   Procedure: EXPLORATORY LAPAROTOMY  total abdominal colectomy with end ileostomy;  Surgeon: Kinsinger, De Blanch, MD;  Location: [facility] OR;  Service: General;  Laterality: N/A;   MULTIPLE TOOTH EXTRACTIONS     POSTERIOR CERVICAL FUSION/FORAMINOTOMY N/A 11/29/2015   Procedure: Cervical Three-Cervical Seven Posterior Cervical Laminectomy with Fusion;  Surgeon: Lisbeth Renshaw, MD;  Location: [facility];  Service: Neurosurgery;  Laterality: N/A;  Cervical Three-Cervical Seven Posterior Cervical Laminectomy with Fusion   STUMP REVISION Right 10/22/2018   Procedure: REVISION RIGHT BELOW KNEE AMPUTATION;  Surgeon: Nadara Mustard, MD;  Location: [facility] OR;  Service: Orthopedics;  Laterality: Right;   STUMP REVISION Right 12/05/2018   Procedure: Revision Right Above Knee Amputation;  Surgeon: Nadara Mustard, MD;  Location: [facility] OR;  Service: Orthopedics;  Laterality: Right;   STUMP REVISION Right 05/29/2019   Procedure: REVISION  RIGHT ABOVE KNEE AMPUTATION;  Surgeon: Nadara Mustard, MD;  Location: [facility] OR;  Service: Orthopedics;  Laterality: Right;   STUMP REVISION Right 06/24/2019   Procedure: STUMP REVISION;  Surgeon: Nadara Mustard, MD;  Location: [facility] OR;  Service: Orthopedics;  Laterality: Right;     A IV Location/Drains/Wounds Patient Lines/Drains/Airways Status     Active Line/Drains/Airways     Name Placement date Placement time Site Days   Peripheral IV 02/08/23 20 G Right Antecubital 02/08/23  1103  Antecubital  less than 1   Colostomy RUQ 12/04/22  0400  RUQ  66   Pressure Injury 09/05/22 Ankle Anterior;Left Stage 2 -  Partial thickness loss of dermis presenting as a shallow open injury with a red, pink wound bed without slough. full thickness wound, NOT a pressure injury 09/05/22  2250  -- 156   Pressure Injury 12/04/22 Buttocks Posterior;Proximal;Right Stage 2 -  Partial thickness loss of dermis presenting as a shallow open injury with a red, pink wound bed without slough. 12/04/22  0410  -- 66   Pressure Injury 12/04/22 Thigh Left;Posterior Stage 3 -  Full thickness tissue loss. Subcutaneous fat may be visible but bone, tendon or muscle are NOT exposed. 12/04/22  0410  -- 66   Wound / Incision (Open or Dehisced) 12/04/22 Non-pressure wound Anterior;Left 12/04/22  0410  --  66   Wound / Incision (Open or Dehisced) 12/15/22 Pretibial Left;Proximal 12/15/22  0800  Pretibial  55            Intake/Output Last 24 hours No intake or output data in the 24 hours ending 02/08/23 1916  Labs/Imaging Results for orders placed or performed during the hospital encounter of 02/08/23 (from the past 48 hour(s))  CBC with Differential     Status: Abnormal   Collection Time: 02/08/23 11:01 AM  Result Value Ref Range   WBC 8.0 4.0 - 10.5 K/uL   RBC 3.02 (L) 4.22 - 5.81 MIL/uL   Hemoglobin 8.2 (L) 13.0 - 17.0 g/dL   HCT 09.8 (L) 11.9 - 14.7 %   MCV 92.4 80.0 - 100.0 fL   MCH 27.2 26.0 - 34.0 pg   MCHC 29.4 (L) 30.0  - 36.0 g/dL   RDW 82.9 (H) 56.2 - 13.0 %   Platelets 307 150 - 400 K/uL   nRBC 0.0 0.0 - 0.2 %   Neutrophils Relative % 72 %   Neutro Abs 5.8 1.7 - 7.7 K/uL   Lymphocytes Relative 17 %   Lymphs Abs 1.4 0.7 - 4.0 K/uL   Monocytes Relative 6 %   Monocytes Absolute 0.4 0.1 - 1.0 K/uL   Eosinophils Relative 3 %   Eosinophils Absolute 0.3 0.0 - 0.5 K/uL   Basophils Relative 1 %   Basophils Absolute 0.1 0.0 - 0.1 K/uL   Immature Granulocytes 1 %   Abs Immature Granulocytes 0.06 0.00 - 0.07 K/uL    Comment: Performed at [facility], [address], [place], [place] [address]  Comprehensive metabolic panel     Status: Abnormal   Collection Time: 02/08/23 11:01 AM  Result Value Ref Range   Sodium 138 135 - 145 mmol/L   Potassium 3.3 (L) 3.5 - 5.1 mmol/L   Chloride 103 98 - 111 mmol/L   CO2 25 22 - 32 mmol/L   Glucose, Bld 145 (H) 70 - 99 mg/dL    Comment: Glucose reference range applies only to samples taken after fasting for at least 8 hours.   BUN 26 (H) 8 - 23 mg/dL   Creatinine, Ser 4.69 (H) 0.61 - 1.24 mg/dL   Calcium 8.6 (L) 8.9 - 10.3 mg/dL   Total Protein 7.6 6.5 - 8.1 g/dL   Albumin 2.2 (L) 3.5 - 5.0 g/dL   AST 14 (L) 15 - 41 U/L   ALT 11 0 - 44 U/L   Alkaline Phosphatase 114 38 - 126 U/L   Total Bilirubin 0.6 0.3 - 1.2 mg/dL   GFR, Estimated 39 (L) >60 mL/min    Comment: (NOTE) Calculated using the CKD-EPI Creatinine Equation (2021)    Anion gap 10 5 - 15    Comment: Performed at [facility], [address] [place], [place] [address]  Lipase, blood     Status: None   Collection Time: 02/08/23 11:01 AM  Result Value Ref Range   Lipase 19 11 - 51 U/L  Comment: Performed at [facility], [address] [place], [place] [address]  I-Stat CG4 Lactic Acid     Status: None   Collection Time: 02/08/23 11:23 AM  Result Value Ref Range   Lactic Acid, Venous 0.9 0.5 - 1.9 mmol/L  SARS Coronavirus 2 by RT PCR (hospital order, performed in [facility]  hospital lab) *cepheid single result test* Anterior Nasal Swab     Status: None   Collection Time: 02/08/23 11:24 AM   Specimen: Anterior Nasal Swab  Result Value Ref Range   SARS Coronavirus 2 by RT PCR NEGATIVE NEGATIVE    Comment: Performed at [facility], [address] [place], [place] [address]  I-Stat CG4 Lactic Acid     Status: Abnormal   Collection Time: 02/08/23  1:51 PM  Result Value Ref Range   Lactic Acid, Venous 0.3 (L) 0.5 - 1.9 mmol/L   CT Head Wo Contrast  Result Date: 02/08/2023 CLINICAL DATA:  Delirium. EXAM: CT HEAD WITHOUT CONTRAST TECHNIQUE: Contiguous axial images were obtained from the base of the skull through the vertex without intravenous contrast. RADIATION DOSE REDUCTION: This exam was performed according to the departmental dose-optimization program which includes automated exposure control, adjustment of the mA and/or kV according to patient size and/or use of iterative reconstruction technique. COMPARISON:  Head CT 12/05/2022 and MRI 09/19/2022 FINDINGS: Brain: There has been interval evolution of the large right PCA infarct which is now chronic in appearance. Hyperdense foci within the area of right occipital encephalomalacia are attributed to mineralization. No definite acute intracranial hemorrhage, acute infarct, mass, midline shift, or extra-axial fluid collection is identified. There is mild cerebral atrophy. Hypodensities elsewhere in the cerebral white matter bilaterally are similar to the prior CT and are nonspecific but compatible with mild chronic small vessel ischemic disease. Vascular: Calcified atherosclerosis at the skull base. No hyperdense vessel. Skull: No acute fracture or suspicious osseous lesion. Sinuses/Orbits: Small mucous retention cyst in the left maxillary sinus. No significant mastoid fluid. Unremarkable orbits. Other: Partially visualized postsurgical changes in the right pre maxillary soft tissues. IMPRESSION: 1. No evidence of acute  intracranial abnormality. 2. Chronic right PCA infarct. 3. Mild chronic small vessel ischemic disease. Electronically Signed   By: Sebastian Ache M.D.   On: 02/08/2023 19:11   CT ABDOMEN PELVIS W CONTRAST  Result Date: 02/08/2023 CLINICAL DATA:  Abdominal pain. Nausea and vomiting. Two months postop from total colectomy with end ileostomy. EXAM: CT ABDOMEN AND PELVIS WITH CONTRAST TECHNIQUE: Multidetector CT imaging of the abdomen and pelvis was performed using the standard protocol following bolus administration of intravenous contrast. RADIATION DOSE REDUCTION: This exam was performed according to the departmental dose-optimization program which includes automated exposure control, adjustment of the mA and/or kV according to patient size and/or use of iterative reconstruction technique. CONTRAST:  60mL OMNIPAQUE IOHEXOL 350 MG/ML SOLN COMPARISON:  12/03/2022 FINDINGS: Lower Chest: Tiny left pleural effusion and mild bibasilar atelectasis. Hepatobiliary: No suspicious hepatic masses identified. Gallstones are seen, however there is no evidence of cholecystitis or biliary dilatation. Pancreas:  No mass or inflammatory changes. Spleen: Within normal limits in size and appearance. Adrenals/Urinary Tract: No suspicious masses identified. No evidence of ureteral calculi or hydronephrosis. Stomach/Bowel: Patient has undergone total colectomy and right abdominal ileostomy since prior study. Generalized mesenteric edema is seen. No No evidence of bowel obstruction. Mild wall thickening is seen involving the [name], consistent with proctitis. A small left abdominal fluid collection is seen between the  inferior margin of the spleen and anterior abdominal wall which measures 5.4 x 2.1 cm on image 37/3. This is suspicious for abscess. Vascular/Lymphatic: No pathologically enlarged lymph nodes. No acute vascular findings. Reproductive:  No mass or other significant abnormality. Other:  None. Musculoskeletal: No  suspicious bone lesions identified. Left posterior 10th rib fracture is unchanged since previous study. 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 Mild wall thickening involving the [name], consistent with proctitis. 5.4 cm fluid collection between the inferior margin of the spleen and left anterior abdominal wall, suspicious for abscess. Cholelithiasis. No radiographic evidence of cholecystitis. Unchanged left posterior 10th rib fracture. Electronically Signed   By: Danae Orleans M.D.   On: 02/08/2023 14:33   DG Chest Portable 1 View  Result Date: 02/08/2023 CLINICAL DATA:  Nausea and vomiting EXAM: PORTABLE CHEST 1 VIEW COMPARISON:  X-ray 12/19/2022 and older FINDINGS: Underinflation. Enlarged cardiopericardial silhouette with tortuous and ectatic aorta. Bronchovascular crowding. The opacity seen in the left mid to lower lung could be related to level of inflation. No pneumothorax or effusion. The inferior costophrenic angles are clipped off the edge of the film. IMPRESSION: Underinflation with bronchovascular crowding. Slightly more opacity left mid to lower lung. This could be related to level of inflation but an infiltrates not excluded. Follow up x-ray may be useful with improved inflation as clinically appropriate or short-term follow-up Electronically Signed   By: Karen Kays M.D.   On: 02/08/2023 12:48    Pending Labs Unresulted Labs (From admission, onward)     Start     Ordered   02/09/23 0500  Basic metabolic panel  Tomorrow morning,   R        02/08/23 1913   02/09/23 0500  CBC  Tomorrow morning,   R        02/08/23 1913   02/08/23 1102  Culture, blood (routine x 2)  BLOOD CULTURE X 2,   R (with STAT occurrences)      02/08/23 1101   02/08/23 1101  Urinalysis, Routine w reflex microscopic -Urine, Clean Catch  (ED Abdominal Pain)  Once,   URGENT       Question:  Specimen Source  Answer:  Urine, Clean Catch   02/08/23 1101             Vitals/Pain Today's Vitals   02/08/23 1102 02/08/23 1230 02/08/23 1506 02/08/23 1515  BP: (!) 143/87  (!) 152/103 (!) 152/103  Pulse: 71  96 78  Resp: 19  17   Temp: 97.6 F (36.4 C)  99 F (37.2 C)   TempSrc:   Oral   SpO2: 100%  98% 97%  Weight:      Height:      PainSc:  Asleep      Isolation Precautions No active isolations  Medications Medications  aspirin EC tablet 81 mg (has no administration in time range)  Uro-MP 118 MG CAPS 118 mg (has no administration in time range)  atorvastatin (LIPITOR) tablet 80 mg (has no administration in time range)  carvedilol (COREG) tablet 12.5 mg (has no administration in time range)  hydrALAZINE (APRESOLINE) tablet 10 mg (has no administration in time range)  escitalopram (LEXAPRO) tablet 20 mg (has no administration in time range)  hydrOXYzine (VISTARIL) capsule 50 mg (has no administration in time range)  QUEtiapine (SEROQUEL) tablet 100 mg (has no administration in time range)  pantoprazole (PROTONIX) EC tablet 20 mg (has no administration in time range)  mirabegron ER (  MYRBETRIQ) tablet 50 mg (has no administration in time range)  tamsulosin (FLOMAX) capsule 0.4 mg (has no administration in time range)  Melatonin TABS 10 mg (has no administration in time range)  pregabalin (LYRICA) capsule 100 mg (has no administration in time range)  enoxaparin (LOVENOX) injection 40 mg (has no administration in time range)  ondansetron (ZOFRAN) tablet 4 mg (has no administration in time range)    Or  ondansetron (ZOFRAN) injection 4 mg (has no administration in time range)  acetaminophen (TYLENOL) tablet 650 mg (has no administration in time range)  HYDROmorphone (DILAUDID) injection 0.5 mg (has no administration in time range)  oxyCODONE (Oxy IR/ROXICODONE) immediate release tablet 5 mg (has no administration in time range)  insulin aspart (novoLOG) injection 0-9 Units (has no administration in time range)  loperamide (IMODIUM) capsule 2  mg (has no administration in time range)  sodium bicarbonate tablet 650 mg (has no administration in time range)  sodium chloride 0.9 % bolus 500 mL (0 mLs Intravenous Stopped 02/08/23 1309)  ondansetron (ZOFRAN) injection 4 mg (4 mg Intravenous Given 02/08/23 1124)  morphine (PF) 4 MG/ML injection 4 mg (4 mg Intravenous Given 02/08/23 1125)  sodium chloride 0.9 % bolus 500 mL (0 mLs Intravenous Stopped 02/08/23 1456)  iohexol (OMNIPAQUE) 350 MG/ML injection 60 mL (60 mLs Intravenous Contrast Given 02/08/23 1325)  piperacillin-tazobactam (ZOSYN) IVPB 3.375 g (0 g Intravenous Stopped 02/08/23 1607)  diazepam (VALIUM) injection 5 mg (5 mg Intravenous Given 02/08/23 1521)    Mobility non-ambulatory     Focused Assessments     R Recommendations: See Admitting Provider Note  Report given to:   Additional Notes:

## 2023-02-08 NOTE — ED Notes (Signed)
Pt refused to go to CT. 

## 2023-02-08 NOTE — Assessment & Plan Note (Addendum)
Home regimen: Lantus 15 u daily and humalog SSI.  - Given decreased po intake and clear liquid diet, no basal insulin at this time, SSI (sensitive)  - CBG AC, at bedtime - A1c

## 2023-02-08 NOTE — ED Notes (Signed)
Pt continues to scream throughout ED while moving to room 34 and then proceeded to swing his fist at this paramedic.

## 2023-02-08 NOTE — ED Notes (Signed)
This RN and [place] Paramedic attempted to clean and put new bag on stoma.  Patient refused  new bag and swung his fist at this RN. MD notified and aware by paramedic.

## 2023-02-08 NOTE — Assessment & Plan Note (Addendum)
Most likely due to abdominal infection. Could also be delirium vs. Delirium. Yelling at hospital staff repeatedly and threw ostomy bag. S/p valium 5mg  with some improvement in behavior.  - UDS pending -home melatonin 10mg  and seroquel 100mg  nightly - holding home gabapentin, hydralazine - call daughter to get a better idea of baseline - Consider psychiatry consult

## 2023-02-08 NOTE — Progress Notes (Signed)
  FMTS Attending Admission Note: Burley Saver, MD  Personal pager:  [phone] FPTS Service Pager:  [phone]   I  have personally seen and examined this patient, reviewed their chart and results. I have discussed this patient with the resident. I agree with the resident's findings, assessment and care plan as documented in the resident's note, clarifications or changes to the resident's note are listed below.  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 He notes green, non-bloody emesis and also green output from his colostomy "not sure how long it has been going on." He also notes abdominal pain. He is alert and oriented. Denies fevers, chills, chest pain, cough, shortness of breath.   On exam, he is alert and oriented x3, screaming in bed about wanting a Coke. Ostomy in RLQ of abdomen pink, with green discharge. Midline abdominal incision with yellow granulation tissue without purulence. + bowel sounds, no TTP of abdomen, no guarding or rebound. Lungs with decreased sounds at bases.  Labs and imaging reviewed.   Nausea & Vomiting, Ostomy discharge- CT abd pelvis showing proctitis and possible LUQ abscess.  Unclear if abscess is source of discharge and pain, agree with broad spectrum antibiotics, Appreciate general surgery recs, NPO pending their recommendations, blood culture pending, lactic acid wnl x2. Not meeting sepsis criteria. PRN antiemetics & Pain control (he notes being on 20mg  oxycodone three times a day, has been at [facility] and will confirm with SNF records). No blood in ostomy or emesis, Hgb stable, will monitor closely. Add PPI if not already on. IR consulted per surgery and discussed stable to monitor and follow clinically and treat conservatively. Surgery discussing PO trial if nausea has resolved,  and if able to tolerate consider switching to PO antibiotics. CKD stage 3b- Cr at baseline. PO trial per surgery. Anemia of chronic disease- Hgb at baseline, without blood in emesis or from ostomy. Continue to monitor.  Will sign resident H&P as becomes available.

## 2023-02-08 NOTE — ED Notes (Signed)
This NT was notified by the floor that the RN receiving the pt was held up and needed some more time to go over the pt's chart.

## 2023-02-08 NOTE — ED Notes (Signed)
Pt continuing to scream down the hallway.. he has been asked several times to stop screaming

## 2023-02-08 NOTE — ED Notes (Signed)
This RN explained the importance not pulling off ostomy bag as Scientist, forensic in room as well.  Pt pulled off ostomy bag; ED does have not have ostomy supplies. Secretary to call for supplies needed.  Wet wash cloth placed over stoma and patient educated about not taking off wet washcloth off as stoma can dry out.  MD Haviland notified and aware.  Pt currently un compliant with keeping monitoring devices on and following directions.

## 2023-02-08 NOTE — ED Notes (Signed)
Pt is screaming for the doctor, screaming for the nurse. Asking for water. He has been told by this paramedic, the doctor and others that he can not have water until the CT results come back. He continues to scream and make a scene. Pt was given wetting sticks to wet his mouth multiple times and continues to scream for more no matter how many times he has been provided with them.

## 2023-02-08 NOTE — ED Notes (Signed)
Pt continues to scream in the room for water and saying he is cold. PT provided with four new warm blankets.

## 2023-02-08 NOTE — Assessment & Plan Note (Deleted)
Creatinine appears to be at baseline. S/p 1L bolus NS in ED. -AM BMP, Mag -replete K, Mag as needed

## 2023-02-08 NOTE — ED Notes (Addendum)
Pt removed colostomy bag, per pt "yeah I did it because you would not give me no damn water." Multiple employees, including myself and the attending doctor informed him he can not have any water at the moment. Pt has been screaming about water for the past hour.  Pt emesis is on floor due to him removing it.

## 2023-02-08 NOTE — Assessment & Plan Note (Deleted)
Fluid mass found on CTAP. Surgery consulted, recommend PO trial and ABX. IR consulted, mass not amenable to drainage. Got Zosyn and 1L bolus NS in the ED. After decision was made not to take pt to the OR, pt was given diet and was tolerating PO fluids but not food.  -redose with ABX in AM -clear liquid diet, advance as tolerated -scheduled tylenol q6hrs -oxycodone 5mg  q4hr PRN for moderate pain -dilaudid 0.5mg  q4hr PRN for severe pain -AM CBC -serial abdominal exams -f/u blood cultures

## 2023-02-08 NOTE — Progress Notes (Addendum)
FMTS Brief Progress Note  S:Sleeping comfortably. [name] RN says he just went to sleep, was very agitated and yelling at the nurses when he was awake. [name] RN states that pt says he normally takes 20mg  oxycodone. Per chart review, pt usually takes 10mg  oxycodone PRN for pain.    O: BP 101/60 (BP Location: Right Arm)   Pulse 88   Temp 98.1 F (36.7 C) (Oral)   Resp 18   Ht 6' (1.829 m)   Wt 104.3 kg   SpO2 94%   BMI 31.19 kg/m   Gen: no acute distress, sleeping, no increased work of breathing  A/P: Abscess of abdominal cavity - redose with abx in AM - clear liquid diet, advance as tolerated - serial abdominal exams - f/u blood cultures - pain control - can consider increasing to pt's home 10mg  oxycodone  AMS - UDS pending - home melatonin 10mg , seroquel 100mg  at night - can consider calling daughter tomorrow for more info on baseline, pt sleeping so did not assess mental status  CKD3b - am BMP, Mag  T2DM - lantus 15u daily, humalog SSI - CBG AC, at bedtime - A1C    - Orders reviewed. Labs for AM ordered (CBC, BMP, mag), which was adjusted as needed.  - If condition changes, plan includes adjusting PRN meds for agitation, consult general surgery if severe abdominal pain.   Para March, DO 02/08/2023, 10:54 PM PGY-1, [facility] Night Resident  Please page [phone] with questions.

## 2023-02-08 NOTE — Assessment & Plan Note (Addendum)
Fluid mass found on CTAP. Surgery consulted, recommend PO trial and ABX. IR consulted, mass not amenable to drainage. Got Zosyn and 1L bolus NS in the ED. After decision was made not to take pt to the OR, pt was given diet and was tolerating PO fluids but not food.  -redose with ABX in AM -clear liquid diet, advance as tolerated -scheduled tylenol q6hrs -oxycodone 5mg  q4hr PRN for moderate pain -dilaudid 0.5mg  q4hr PRN for severe pain -AM CBC -serial abdominal exams -f/u blood cultures

## 2023-02-08 NOTE — H&P (Cosign Needed Addendum)
Hospital Admission History and Physical Service Pager: [phone]  Patient name: Gregory [name] Medical record number: [id number] Date of Birth: [birth year] Age: 62 y.o. Gender: male  Primary Care Provider: [name], Provider, MD Consultants: General Surgery  Code Status: Full code  Preferred Emergency Contact:  Extended Emergency Contact Information Primary Emergency Contact: Merril Abbe, [place] [address] [place] Home Phone: [phone] Mobile Phone: [phone] Relation: Daughter   Chief Complaint: nausea, vomiting, abdominal pain  Assessment and Plan: Gregory [name] is a 62 y.o. male w/ PMH ischemic bowel s/p colectomy, presenting with nausea, vomiting, and abdominal pain found to have a fluid collection in the LUQ. Pt also with altered mental status. Differential for this patient's presentation of this includes intraabdominal infection vs viral gastroenteritis vs bowel obstruction vs sepsis. Fluid collection found on CTAP points to abscess. Less likely small bowel obstruction given benign physical exam and patient with increased ostomy output over the past day and ability to tolerate fluids without ongoing emesis. Lactic acid WNL, afebrile, no WCC so less likely sepsis, however this could explain his AMS. Other possible explanations for altered mental status includes stroke, substance use, or primary psychiatric disorder. Unlikely acute intracranial pathology given no focal neurological deficits and negative head CT. However, cannot rule out toxic encephalopathy at this time. Also cannot rule out primary psychiatric disorder given comorbidities and lack of history.  Assessment & Plan Abscess of abdominal cavity (HCC) Fluid mass found on CTAP. Surgery consulted, recommend PO trial and ABX. IR consulted, mass not amenable to drainage. Got Zosyn and 1L bolus NS in the ED. After decision was made not to take pt to the OR, pt was given diet and was  tolerating PO fluids but not food.  -redose with ABX in AM -clear liquid diet, advance as tolerated -scheduled tylenol q6hrs -oxycodone 5mg  q4hr PRN for moderate pain -dilaudid 0.5mg  q4hr PRN for severe pain -AM CBC -serial abdominal exams -f/u blood cultures Altered mental status Most likely due to abdominal infection. Could also be delirium vs. Delirium. Yelling at hospital staff repeatedly and threw ostomy bag. S/p valium 5mg  with some improvement in behavior.  - UDS pending -home melatonin 10mg  and seroquel 100mg  nightly - holding home gabapentin, hydralazine - call daughter to get a better idea of baseline - Consider psychiatry consult  Chronic kidney disease, stage 3b (HCC) Creatinine appears to be at baseline. S/p 1L bolus NS in ED. -AM BMP, Mag -replete K, Mag as needed Type II diabetes mellitus with neurological manifestations (HCC) Home regimen: Lantus 15 u daily and humalog SSI.  - Given decreased po intake and clear liquid diet, no basal insulin at this time, SSI (sensitive)  - CBG AC, at bedtime - A1c   Chronic and Stable Conditions:  HTN: hydralazine 10mg  q8hr HFrEF: coreg 12.4mg  BID AKA: lyrica 100mg  at bedtime Urinary retention: home mirabegon, tamsulosin, uro-MP Ostomy: home loperamide 2mg  daily Depression: home lexapro 20mg  daily, Seroquel 100 mg  HLD: home atorvastatin 80mg  daily  FEN/GI: N.p.o. for possible surgical procedure VTE Prophylaxis: lovenox  Disposition: admit to med-surg  History of Present Illness:  Gregory [name] is a 62 y.o. male presenting with abdominal pain, nausea, and vomiting since yesterday. He also had a fever to 102 yesterday.  Denies diarrhea.    Patient states he has been having increased abdominal pain since yesterday. Has additionally been having copious vomiting with last emesis right before admission.  States he feels that his ostomy has had more output since yesterday. Denies headache, vision change, chest pain, shortness of  breath.  Denies fevers. Denies blood in ostomy output. Denies diarrhea.   In the ED, patient was afebrile and hemodynamically stable. He was found to have a normal lactic acid and Hgb of 8.2 with no leukocytosis. He received zofran for nausea and morphine for pain. CMP was significant for hypokalemia at 3.3 and creatinine of 1.91, which appears close to his baseline. Imaging was significant for a fluid collection under the spleen suspicious for an abscess. Zosyn was started and blood cultures were collected. While in the ED, the patient became quite agitated, screaming at staff to bring him water and removing his colostomy bag. He was given a dose of Valium. He continued to yell at ED staff. Per daughter, patient is confused compared to his baseline. Ir was consulted and evaluated that fluid collection was too small to be drained. General surgery was consulted and recommended antibiotics and discharge if patient would tolerate po. However, he was not able to tolerate po.   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   Pertinent Social History: Tobacco use: no Alcohol use: no Other Substance use: no Lives with ?  Pertinent Family History:  Remainder reviewed in history tab.   Important Outpatient Medications: ASA 81mg   Atorvastatin 80 Carvedilol 12.5 Bid Lexapro 20mg  Fe 325mg  Lasix 40mg  daily Gabapentin 200mg  TID Hydral 10mg  TID Atarax 25mg  q6hr PRN for anxiety Atarax 50mg  nightly Insulin humalog 0-10u with meals and at bedtime Lantus 15u daily Duoneb q6hr PRN Loperamide 2mg  BID Mg 400mg   BID Mirabegaron 50mg  daily Prilosec 20mg  nightly Lyrica 100mg  BID Seroquel 100mg  nightly Tamsulosin 4mg  nightly Trulicity 0.7mg  weekly Uro-MP 118mg  BID   Remainder reviewed in medication history.   Objective: BP (!) 152/103 (BP Location: Left Arm)   Pulse 96   Temp 99 F (37.2 C) (Oral)   Resp 17   Ht 6' (1.829 m)   Wt 104.3 kg   SpO2 98%   BMI 31.19 kg/m  Exam: General: Middle-aged male lying in bed holding right stump, repeatedly asking for water Neck: Supple Cardiovascular: Regular rate and rhythm, normal S1/S2, no murmurs, rubs, gallops Respiratory: Clear to auscultation bilaterally, no wheezes, rales, rhonchi Gastrointestinal: Bowel sounds present, soft, nontender, nondistended MSK: Right AKA stump appears normal, no swelling to left lower extremity Derm: Warm, dry Neuro: No gross focal deficits, moving all extremities spontaneously Psych: agitated, perseverant, somewhat redirectable  Labs:  CBC BMET  Recent Labs  Lab 02/08/23 1101  WBC 8.0  HGB 8.2*  HCT 27.9*  PLT 307   Recent Labs  Lab 02/08/23 1101  NA 138  K 3.3*  CL 103  CO2 25  BUN 26*  CREATININE 1.91*  GLUCOSE 145*  CALCIUM 8.6*     EKG:  Sinus rhythm, normal P waves, normal QRS duration, normal Qtc interval, no ST elevations or depressions  Imaging Studies Performed:  CXR:  Underinflation with bronchovascular crowding. Slightly more opacity left mid to lower lung. This could be related to level of inflation but an infiltrates  not excluded. 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   Mild wall thickening involving the [name], consistent with proctitis.   5.4 cm fluid collection between the inferior margin of the spleen and left anterior abdominal wall, suspicious for abscess.   Cholelithiasis. No radiographic evidence of  cholecystitis.   Unchanged left posterior 10th rib fracture.    CT head 1. No evidence of acute intracranial abnormality. 2. Chronic right PCA infarct. 3. Mild chronic small vessel ischemic disease.  Lorayne Bender, MD 02/08/2023, 3:32 PM PGY-1, [facility]  FPTS Intern pager: [phone], text pages welcome Secure chat group [facility] Teaching Service    Upper Level Attestation I have seen and examined the patient with the resident . I agree with the history, physical, and assessment above with any necessary edits.   Lockie Mola, MD  Family Medicine Teaching Service

## 2023-02-08 NOTE — ED Notes (Signed)
Patient transported to CT 

## 2023-02-08 NOTE — Assessment & Plan Note (Signed)
Creatinine appears to be at baseline. S/p 1L bolus NS in ED. -AM BMP, Mag -replete K, Mag as needed

## 2023-02-08 NOTE — ED Notes (Signed)
Pt continues to holler for water and coke and to scream out the names Bonney Roussel and Aundra Millet and try to offer money for water. Neurological check done and pt is [id number]

## 2023-02-08 NOTE — ED Provider Notes (Signed)
[place] EMERGENCY DEPARTMENT AT [facility] Provider Note   CSN: [id number] Arrival date & time: 02/08/23  1046     History  Chief Complaint  Patient presents with   Abdominal Pain   Nausea   Emesis    Gregory [name] is a 62 y.o. male.  Pt is a 62 yo male with pmhx significant for COPD on O2, DM2, HTN, osteomyelitis, gerd, chf, depression, s/p R AKA, and a recent admission (6/17-7/9) for anemia, sepsis and ischemic colon s/p colectomy.  Pt did receive 4 units prbcs while hospitalized and hgb was 7.4 at d/c.  Pt has been back twice for anemia and sent back to SNF.  Pt comes in today because of n/v and abd pain.  He had a fever yesterday.  His colostomy is having greenish-colored output and he said he is vomiting the same thing.       Home Medications Prior to Admission medications   Medication Sig Start Date End Date Taking? Authorizing Provider  acetaminophen (TYLENOL) 500 MG tablet Take 1,000 mg by mouth every 8 (eight) hours as needed for mild pain.   Yes [provider]  aspirin EC 81 MG tablet Take 81 mg by mouth daily. Swallow whole.   Yes [provider]  atorvastatin (LIPITOR) 80 MG tablet Take 1 tablet (80 mg total) by mouth daily. Patient taking differently: Take 80 mg by mouth at bedtime. 09/22/22 01/27/24 Yes Jerald Kief, MD  carvedilol (COREG) 12.5 MG tablet Take 1 tablet (12.5 mg total) by mouth 2 (two) times daily with a meal. 12/25/22  Yes Dorcas Carrow, MD  cetirizine (ZYRTEC) 10 MG tablet Take 10 mg by mouth daily.   Yes [provider]  Cholecalciferol 25 MCG (1000 UT) capsule Take 1,000 Units by mouth daily.   Yes [provider]  Cranberry 450 MG CAPS Take 450 mg by mouth daily.   Yes [provider]  cyclobenzaprine (FLEXERIL) 5 MG tablet Take 5 mg by mouth every 12 (twelve) hours as needed for muscle spasms.   Yes [provider]  Dextran 70-Hypromellose (NATURAL BALANCE TEARS) 0.1-0.3 % SOLN  Place 1 drop into both eyes every 4 (four) hours as needed (dry eyes).   Yes [provider]  escitalopram (LEXAPRO) 20 MG tablet Take 20 mg by mouth daily.   Yes [provider]  ferrous sulfate 325 (65 FE) MG tablet Take 1 tablet (325 mg total) by mouth 3 (three) times daily with meals. Patient taking differently: Take 325 mg by mouth daily. With meal. 01/23/23  Yes Cathren Laine, MD  fluticasone [facility]) 50 MCG/ACT nasal spray Place 1 spray into both nostrils daily.   Yes [provider]  furosemide (LASIX) 40 MG tablet Take 40 mg by mouth daily.   Yes [provider]  gabapentin (NEURONTIN) 100 MG capsule Take 2 capsules (200 mg total) by mouth every 8 (eight) hours. 12/25/22 02/08/23 Yes Dorcas Carrow, MD  hydrALAZINE (APRESOLINE) 10 MG tablet Take 1 tablet (10 mg total) by mouth every 8 (eight) hours. 12/25/22  Yes Dorcas Carrow, MD  hydrOXYzine (ATARAX) 25 MG tablet Take 25 mg by mouth every 6 (six) hours as needed for anxiety.   Yes [provider]  hydrOXYzine (VISTARIL) 50 MG capsule Take 50 mg by mouth at bedtime.   Yes [provider]  insulin glargine (LANTUS SOLOSTAR) 100 UNIT/ML Solostar Pen Inject 15 Units into the skin daily. Patient taking differently: Inject 15 Units into the  skin at bedtime. 09/07/22  Yes Rai, Ripudeep K, MD  insulin lispro (HUMALOG) 100 UNIT/ML KwikPen Inject 0-10 Units into the skin See admin instructions. Inject 0-10 units into the skin three times a day and at bedtime, PER SLIDING SCALE:  BGL 151-200 = 2 units 201-250 = 4 units 251-300 = 6 units 301-350 =  8 units 351-400 = 10 units Patient taking differently: Inject 0-10 Units into the skin See admin instructions. Inject 0-10 units into the skin before meals and at bedtime, PER SLIDING SCALE:  0-150 = 0 units 151-200 = 2 units 201-250 = 4 units 251-300 = 6 units 301-350 =  8 units 351-400 = 10 units 01/31/22  Yes Dahal, Melina Schools, MD  ipratropium-albuterol  (DUONEB) 0.5-2.5 (3) MG/3ML SOLN Take 3 mLs by nebulization every 6 (six) hours as needed (for shortness of breath).   Yes [provider]  lidocaine (LIDODERM) 5 % Place 2 patches onto the skin daily. Remove & Discard patch within 12 hours or as directed by MD 12/25/22  Yes Dorcas Carrow, MD  loperamide (IMODIUM) 2 MG capsule Take 1 capsule (2 mg total) by mouth 2 (two) times daily. 12/25/22  Yes Dorcas Carrow, MD  magnesium oxide (MAG-OX) 400 (240 Mg) MG tablet Take 400 mg by mouth in the morning, at noon, and at bedtime.   Yes [provider]  Melatonin 10 MG TABS Take 10 mg by mouth at bedtime.   Yes [provider]  Meth-Hyo-M Bl-Na Phos-Ph Sal (URO-MP) 118 MG CAPS Take 118 mg by mouth 2 (two) times daily.   Yes [provider]  mirabegron ER (MYRBETRIQ) 50 MG TB24 tablet Take 50 mg by mouth daily.   Yes [provider]  Multiple Vitamin (MULTIVITAMIN WITH MINERALS) TABS tablet Take 1 tablet by mouth daily. 12/25/22  Yes Dorcas Carrow, MD  naloxone [facility]) 4 MG/0.1ML LIQD nasal spray kit Place 1 spray into the nose as needed (poorly responsive or turning blue).   Yes [provider]  nystatin (MYCOSTATIN/NYSTOP) powder Apply 1 Application topically daily. To groin/scrotum   Yes [provider]  omeprazole (PRILOSEC) 20 MG capsule Take 20 mg by mouth at bedtime.   Yes [provider]  ondansetron (ZOFRAN) 4 MG tablet Take 4 mg by mouth every 8 (eight) hours as needed for nausea.   Yes [provider]  Oxycodone HCl 10 MG TABS Take 10 mg by mouth every 4 (four) hours. Hold for sedation.   Yes [provider]  oxymetazoline (AFRIN) 0.05 % nasal spray Place 1 spray into both nostrils 2 (two) times daily as needed for congestion.   Yes [provider]  pregabalin (LYRICA) 100 MG capsule Take 1 capsule (100 mg total) by mouth 2 (two) times daily. 12/25/22 02/08/23 Yes Dorcas Carrow, MD  sodium bicarbonate 650  MG tablet Take 650 mg by mouth 2 (two) times daily.   Yes [provider]  tamsulosin (FLOMAX) 0.4 MG CAPS capsule Take 0.4 mg by mouth daily. 11/26/13  Yes [provider]  trolamine salicylate (ASPERCREME) 10 % cream Apply 1 Application topically every 6 (six) hours as needed for muscle pain. Right leg   Yes [provider]  TRULICITY 0.75 MG/0.5ML SOPN Inject 0.75 mg into the skin every Thursday. 11/29/22  Yes [provider]  cyanocobalamin 1000 MCG tablet Take 1,000 mcg by mouth daily. Patient not taking: Reported on 02/08/2023    [provider]  QUEtiapine (SEROQUEL) 100 MG tablet Take 100 mg  by mouth at bedtime. Patient not taking: Reported on 02/08/2023    [provider]      Allergies    Latex    Review of Systems   Review of Systems  Gastrointestinal:  Positive for abdominal pain, nausea and vomiting.  All other systems reviewed and are negative.   Physical Exam Updated Vital Signs BP (!) 152/103 (BP Location: Left Arm)   Pulse 96   Temp 99 F (37.2 C) (Oral)   Resp 17   Ht 6' (1.829 m)   Wt 104.3 kg   SpO2 98%   BMI 31.19 kg/m  Physical Exam Vitals and nursing note reviewed.  Constitutional:      Appearance: He is well-developed. He is obese.  HENT:     Head: Normocephalic and atraumatic.     Mouth/Throat:     Mouth: Mucous membranes are dry.     Pharynx: Oropharynx is clear.  Eyes:     Extraocular Movements: Extraocular movements intact.     Pupils: Pupils are equal, round, and reactive to light.  Cardiovascular:     Rate and Rhythm: Normal rate and regular rhythm.     Heart sounds: Normal heart sounds.  Pulmonary:     Effort: Pulmonary effort is normal.     Breath sounds: Normal breath sounds.  Abdominal:     General: Abdomen is flat. Bowel sounds are normal.     Palpations: Abdomen is soft.     Tenderness: There is generalized abdominal tenderness.     Comments: Colostomy noted  Skin:    General:  Skin is warm.     Capillary Refill: Capillary refill takes less than 2 seconds.  Neurological:     General: No focal deficit present.     Mental Status: He is alert and oriented to person, place, and time.  Psychiatric:        Behavior: Behavior is agitated.     ED Results / Procedures / Treatments   Labs (all labs ordered are listed, but only abnormal results are displayed) Labs Reviewed  CBC WITH DIFFERENTIAL/PLATELET - Abnormal; Notable for the following components:      Result Value   RBC 3.02 (*)    Hemoglobin 8.2 (*)    HCT 27.9 (*)    MCHC 29.4 (*)    RDW 16.5 (*)    All other components within normal limits  COMPREHENSIVE METABOLIC PANEL - Abnormal; Notable for the following components:   Potassium 3.3 (*)    Glucose, Bld 145 (*)    BUN 26 (*)    Creatinine, Ser 1.91 (*)    Calcium 8.6 (*)    Albumin 2.2 (*)    AST 14 (*)    GFR, Estimated 39 (*)    All other components within normal limits  I-STAT CG4 LACTIC ACID, ED - Abnormal; Notable for the following components:   Lactic Acid, Venous 0.3 (*)    All other components within normal limits  SARS CORONAVIRUS 2 BY RT PCR  CULTURE, BLOOD (ROUTINE X 2)  CULTURE, BLOOD (ROUTINE X 2)  LIPASE, BLOOD  URINALYSIS, ROUTINE W REFLEX MICROSCOPIC  I-STAT CG4 LACTIC ACID, ED    EKG EKG Interpretation Date/Time:  Friday February 08 2023 10:59:34 EDT Ventricular Rate:  73 PR Interval:  216 QRS Duration:  117 QT Interval:  424 QTC Calculation: 468 R Axis:   88  Text Interpretation: Borderline prolonged PR interval Nonspecific intraventricular conduction delay Borderline T abnormalities, anterior leads Baseline wander in  lead(s) V4 No significant change since last tracing Confirmed by Jacalyn Lefevre [id number]) on 02/08/2023 11:32:20 AM  Radiology CT ABDOMEN PELVIS W CONTRAST  Result Date: 02/08/2023 CLINICAL DATA:  Abdominal pain. Nausea and vomiting. Two months postop from total colectomy with end ileostomy. EXAM: CT  ABDOMEN AND PELVIS WITH CONTRAST TECHNIQUE: Multidetector CT imaging of the abdomen and pelvis was performed using the standard protocol following bolus administration of intravenous contrast. RADIATION DOSE REDUCTION: This exam was performed according to the departmental dose-optimization program which includes automated exposure control, adjustment of the mA and/or kV according to patient size and/or use of iterative reconstruction technique. CONTRAST:  60mL OMNIPAQUE IOHEXOL 350 MG/ML SOLN COMPARISON:  12/03/2022 FINDINGS: Lower Chest: Tiny left pleural effusion and mild bibasilar atelectasis. Hepatobiliary: No suspicious hepatic masses identified. Gallstones are seen, however there is no evidence of cholecystitis or biliary dilatation. Pancreas:  No mass or inflammatory changes. Spleen: Within normal limits in size and appearance. Adrenals/Urinary Tract: No suspicious masses identified. No evidence of ureteral calculi or hydronephrosis. Stomach/Bowel: Patient has undergone total colectomy and right abdominal ileostomy since prior study. Generalized mesenteric edema is seen. No No evidence of bowel obstruction. Mild wall thickening is seen involving the [name], consistent with proctitis. A small left abdominal fluid collection is seen between the inferior margin of the spleen and anterior abdominal wall which measures 5.4 x 2.1 cm on image 37/3. This is suspicious for abscess. Vascular/Lymphatic: No pathologically enlarged lymph nodes. No acute vascular findings. Reproductive:  No mass or other significant abnormality. Other:  None. Musculoskeletal: No suspicious bone lesions identified. Left posterior 10th rib fracture is unchanged since previous study. 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 Mild wall thickening involving the [name], consistent with proctitis. 5.4 cm fluid collection between the inferior margin of the spleen and left anterior abdominal  wall, suspicious for abscess. Cholelithiasis. No radiographic evidence of cholecystitis. Unchanged left posterior 10th rib fracture. Electronically Signed   By: Danae Orleans M.D.   On: 02/08/2023 14:33   DG Chest Portable 1 View  Result Date: 02/08/2023 CLINICAL DATA:  Nausea and vomiting EXAM: PORTABLE CHEST 1 VIEW COMPARISON:  X-ray 12/19/2022 and older FINDINGS: Underinflation. Enlarged cardiopericardial silhouette with tortuous and ectatic aorta. Bronchovascular crowding. The opacity seen in the left mid to lower lung could be related to level of inflation. No pneumothorax or effusion. The inferior costophrenic angles are clipped off the edge of the film. IMPRESSION: Underinflation with bronchovascular crowding. Slightly more opacity left mid to lower lung. This could be related to level of inflation but an infiltrates not excluded. Follow up x-ray may be useful with improved inflation as clinically appropriate or short-term follow-up Electronically Signed   By: Karen Kays M.D.   On: 02/08/2023 12:48    Procedures Procedures    Medications Ordered in ED Medications  piperacillin-tazobactam (ZOSYN) IVPB 3.375 g (3.375 g Intravenous New Bag/Given 02/08/23 1521)  sodium chloride 0.9 % bolus 500 mL (0 mLs Intravenous Stopped 02/08/23 1309)  ondansetron (ZOFRAN) injection 4 mg (4 mg Intravenous Given 02/08/23 1124)  morphine (PF) 4 MG/ML injection 4 mg (4 mg Intravenous Given 02/08/23 1125)  sodium chloride 0.9 % bolus 500 mL (0 mLs Intravenous Stopped 02/08/23 1456)  iohexol (OMNIPAQUE) 350 MG/ML injection 60 mL (60 mLs Intravenous Contrast Given 02/08/23 1325)  diazepam (VALIUM) injection 5 mg (5 mg Intravenous Given 02/08/23 1521)    ED Course/ Medical Decision Making/ A&P  Medical Decision Making Amount and/or Complexity of Data Reviewed Labs: ordered. Radiology: ordered.  Risk Prescription drug management. Decision regarding hospitalization.   This  patient presents to the ED for concern of abd pain, this involves an extensive number of treatment options, and is a complaint that carries with it a high risk of complications and morbidity.  The differential diagnosis includes sbo, infection   Co morbidities that complicate the patient evaluation  COPD on O2, DM2, HTN, osteomyelitis, gerd, chf, depression, s/p R AKA, and a recent admission (6/17-7/9) for anemia, sepsis and ischemic colon s/p colectomy   Additional history obtained:  Additional history obtained from epic chart review External records from outside source obtained and reviewed including EMS report   Lab Tests:  I Ordered, and personally interpreted labs.  The pertinent results include:  lactic nl, cbc with hgb low at 8.2 (chronic); cmp with bun 26 and cr 1.91 (chronic), covid neg   Imaging Studies ordered:  I ordered imaging studies including cxr and ct abd/pelvis  I independently visualized and interpreted imaging which showed  CXR: Underinflation with bronchovascular crowding. Slightly more opacity  left mid to lower lung. This could be related to level of inflation  but an infiltrates not excluded. Follow up x-ray may be useful with  improved inflation as clinically appropriate or short-term follow-up  CT abd/pelvis: Status post total colectomy and right abdominal end ileostomy.    Mild wall thickening involving the [name], consistent with  proctitis.    5.4 cm fluid collection between the inferior margin of the spleen  and left anterior abdominal wall, suspicious for abscess.    Cholelithiasis. No radiographic evidence of cholecystitis.    Unchanged left posterior 10th rib fracture.   I agree with the radiologist interpretation   Cardiac Monitoring:  The patient was maintained on a cardiac monitor.  I personally viewed and interpreted the cardiac monitored which showed an underlying rhythm of: nsr   Medicines ordered and prescription drug  management:  I ordered medication including zofran/morphine/ivfs  for sx  Reevaluation of the patient after these medicines showed that the patient improved I have reviewed the patients home medicines and have made adjustments as needed   Test Considered:  ct   Critical Interventions:  ct   Consultations Obtained:  I requested consultation with gen surg,  and discussed lab and imaging findings as well as pertinent plan - they recommend: will see pt in consult.  They request medicine admission. Pt d/w FP who will admit.   Problem List / ED Course:  Abd abscess: IV Zosyn ordered.  Surgery to see in consul. Agitation:  pt very agitated and screaming for nurses and for me.  He ripped off his colostomy bag and has taken off his monitor leads.  Pt is willing to take some valium to relax.   Reevaluation:  After the interventions noted above, I reevaluated the patient and found that they have :improved   Social Determinants of Health:  Lives in SNF   Dispostion:  After consideration of the diagnostic results and the patients response to treatment, I feel that the patent would benefit from admission.          Final Clinical Impression(s) / ED Diagnoses Final diagnoses:  Chronic anemia  Abscess of abdominal cavity (HCC)  Stage 3b chronic kidney disease (HCC)    Rx / DC Orders ED Discharge Orders     None         Jacalyn Lefevre, MD 02/08/23  1532  

## 2023-02-09 ENCOUNTER — Other Ambulatory Visit (HOSPITAL_COMMUNITY): Payer: Self-pay

## 2023-02-09 DIAGNOSIS — E1122 Type 2 diabetes mellitus with diabetic chronic kidney disease: Secondary | ICD-10-CM

## 2023-02-09 DIAGNOSIS — N1832 Chronic kidney disease, stage 3b: Secondary | ICD-10-CM | POA: Diagnosis not present

## 2023-02-09 DIAGNOSIS — R4182 Altered mental status, unspecified: Secondary | ICD-10-CM

## 2023-02-09 DIAGNOSIS — K651 Peritoneal abscess: Secondary | ICD-10-CM | POA: Diagnosis not present

## 2023-02-09 DIAGNOSIS — D649 Anemia, unspecified: Secondary | ICD-10-CM | POA: Diagnosis not present

## 2023-02-09 DIAGNOSIS — R198 Other specified symptoms and signs involving the digestive system and abdomen: Secondary | ICD-10-CM

## 2023-02-09 LAB — URINALYSIS, ROUTINE W REFLEX MICROSCOPIC
Bilirubin Urine: NEGATIVE
Glucose, UA: NEGATIVE mg/dL
Ketones, ur: NEGATIVE mg/dL
Nitrite: NEGATIVE
Protein, ur: 30 mg/dL — AB
Specific Gravity, Urine: 1.015 (ref 1.005–1.030)
pH: 8 (ref 5.0–8.0)

## 2023-02-09 LAB — CBC
HCT: 23.4 % — ABNORMAL LOW (ref 39.0–52.0)
Hemoglobin: 7 g/dL — ABNORMAL LOW (ref 13.0–17.0)
MCH: 27 pg (ref 26.0–34.0)
MCHC: 29.9 g/dL — ABNORMAL LOW (ref 30.0–36.0)
MCV: 90.3 fL (ref 80.0–100.0)
Platelets: 258 10*3/uL (ref 150–400)
RBC: 2.59 MIL/uL — ABNORMAL LOW (ref 4.22–5.81)
RDW: 16.5 % — ABNORMAL HIGH (ref 11.5–15.5)
WBC: 16.3 10*3/uL — ABNORMAL HIGH (ref 4.0–10.5)
nRBC: 0 % (ref 0.0–0.2)

## 2023-02-09 LAB — BLOOD CULTURE ID PANEL (REFLEXED) - BCID2

## 2023-02-09 LAB — BASIC METABOLIC PANEL
Anion gap: 10 (ref 5–15)
Anion gap: 10 (ref 5–15)
BUN: 29 mg/dL — ABNORMAL HIGH (ref 8–23)
BUN: 30 mg/dL — ABNORMAL HIGH (ref 8–23)
CO2: 22 mmol/L (ref 22–32)
CO2: 23 mmol/L (ref 22–32)
Calcium: 7.8 mg/dL — ABNORMAL LOW (ref 8.9–10.3)
Calcium: 7.9 mg/dL — ABNORMAL LOW (ref 8.9–10.3)
Chloride: 100 mmol/L (ref 98–111)
Chloride: 100 mmol/L (ref 98–111)
Creatinine, Ser: 2.07 mg/dL — ABNORMAL HIGH (ref 0.61–1.24)
Creatinine, Ser: 2 mg/dL — ABNORMAL HIGH (ref 0.61–1.24)
GFR, Estimated: 35 mL/min — ABNORMAL LOW (ref >60)
GFR, Estimated: 37 mL/min — ABNORMAL LOW (ref >60)
Glucose, Bld: 105 mg/dL — ABNORMAL HIGH (ref 70–99)
Glucose, Bld: 178 mg/dL — ABNORMAL HIGH (ref 70–99)
Potassium: 3.3 mmol/L — ABNORMAL LOW (ref 3.5–5.1)
Potassium: 3.6 mmol/L (ref 3.5–5.1)
Sodium: 132 mmol/L — ABNORMAL LOW (ref 135–145)
Sodium: 133 mmol/L — ABNORMAL LOW (ref 135–145)

## 2023-02-09 LAB — GLUCOSE, CAPILLARY
Glucose-Capillary: 98 mg/dL (ref 70–99)
Glucose-Capillary: 106 mg/dL — ABNORMAL HIGH (ref 70–99)
Glucose-Capillary: 137 mg/dL — ABNORMAL HIGH (ref 70–99)

## 2023-02-09 LAB — RAPID URINE DRUG SCREEN, HOSP PERFORMED
Amphetamines: NOT DETECTED
Barbiturates: NOT DETECTED
Benzodiazepines: POSITIVE — AB
Cocaine: NOT DETECTED
Opiates: POSITIVE — AB
Tetrahydrocannabinol: NOT DETECTED

## 2023-02-09 LAB — MAGNESIUM
Magnesium: 1.2 mg/dL — ABNORMAL LOW (ref 1.7–2.4)
Magnesium: 2.2 mg/dL (ref 1.7–2.4)

## 2023-02-09 LAB — MRSA NEXT GEN BY PCR, NASAL: MRSA by PCR Next Gen: DETECTED — AB

## 2023-02-09 MED ORDER — SIMETHICONE 40 MG/0.6ML PO SUSP
80.0000 mg | Freq: Four times a day (QID) | ORAL | Status: DC | PRN
  Filled 2023-02-09: qty 1.2

## 2023-02-09 MED ORDER — ACETAMINOPHEN 650 MG RE SUPP: 650.0000 mg | Freq: Four times a day (QID) | RECTAL | Status: DC | PRN

## 2023-02-09 MED ORDER — TAMSULOSIN HCL 0.4 MG PO CAPS
0.4000 mg | ORAL_CAPSULE | Freq: Once | ORAL | Status: AC
  Administered 2023-02-09: 0.4 mg via ORAL
  Filled 2023-02-09: qty 1

## 2023-02-09 MED ORDER — ONDANSETRON HCL 4 MG/2ML IJ SOLN: 4.0000 mg | Freq: Four times a day (QID) | INTRAMUSCULAR | Status: DC | PRN

## 2023-02-09 MED ORDER — CHLORHEXIDINE GLUCONATE CLOTH 2 % EX PADS
6.0000 | MEDICATED_PAD | Freq: Every day | CUTANEOUS | Status: DC
  Administered 2023-02-09: 6 via TOPICAL

## 2023-02-09 MED ORDER — SODIUM CHLORIDE 0.9 % IV SOLN: INTRAVENOUS | Status: DC | PRN

## 2023-02-09 MED ORDER — ACETAMINOPHEN 325 MG PO TABS: 325.0000 mg | ORAL_TABLET | Freq: Four times a day (QID) | ORAL | Status: DC | PRN

## 2023-02-09 MED ORDER — BOOST / RESOURCE BREEZE PO LIQD CUSTOM: 1.0000 | Freq: Three times a day (TID) | ORAL | Status: DC

## 2023-02-09 MED ORDER — CARMEX CLASSIC LIP BALM EX OINT
1.0000 | TOPICAL_OINTMENT | CUTANEOUS | Status: DC | PRN
  Filled 2023-02-09: qty 10

## 2023-02-09 MED ORDER — MAGNESIUM SULFATE 2 GM/50ML IV SOLN: 2.0000 g | Freq: Once | INTRAVENOUS | Status: DC

## 2023-02-09 MED ORDER — PHENOL 1.4 % MT LIQD: 2.0000 | OROMUCOSAL | Status: DC | PRN

## 2023-02-09 MED ORDER — FERROUS SULFATE 325 (65 FE) MG PO TABS
325.0000 mg | ORAL_TABLET | Freq: Two times a day (BID) | ORAL | Status: DC
  Administered 2023-02-09: 325 mg via ORAL
  Filled 2023-02-09: qty 1

## 2023-02-09 MED ORDER — SODIUM CHLORIDE 0.9 % IV SOLN
2.0000 g | INTRAVENOUS | Status: DC
  Administered 2023-02-09: 2 g via INTRAVENOUS
  Filled 2023-02-09: qty 20

## 2023-02-09 MED ORDER — ALUM & MAG HYDROXIDE-SIMETH 200-200-20 MG/5ML PO SUSP: 30.0000 mL | Freq: Four times a day (QID) | ORAL | Status: DC | PRN

## 2023-02-09 MED ORDER — OXYMETAZOLINE HCL 0.05 % NA SOLN
1.0000 | Freq: Two times a day (BID) | NASAL | Status: DC
  Administered 2023-02-09: 1 via NASAL
  Filled 2023-02-09: qty 30

## 2023-02-09 MED ORDER — SODIUM CHLORIDE 0.9 % IV SOLN: 25.0000 mg | Freq: Four times a day (QID) | INTRAVENOUS | Status: DC | PRN

## 2023-02-09 MED ORDER — OXYCODONE HCL 5 MG PO TABS
5.0000 mg | ORAL_TABLET | Freq: Once | ORAL | Status: AC
  Administered 2023-02-09: 5 mg via ORAL
  Filled 2023-02-09: qty 1

## 2023-02-09 MED ORDER — CALCIUM POLYCARBOPHIL 625 MG PO TABS
625.0000 mg | ORAL_TABLET | Freq: Two times a day (BID) | ORAL | Status: DC
  Administered 2023-02-09: 625 mg via ORAL
  Filled 2023-02-09 (×2): qty 1

## 2023-02-09 MED ORDER — LACTATED RINGERS IV BOLUS: 1000.0000 mL | Freq: Three times a day (TID) | INTRAVENOUS | Status: DC | PRN

## 2023-02-09 MED ORDER — LOPERAMIDE HCL 2 MG PO CAPS: 2.0000 mg | ORAL_CAPSULE | Freq: Four times a day (QID) | ORAL | Status: DC | PRN

## 2023-02-09 MED ORDER — LACTATED RINGERS IV BOLUS
500.0000 mL | Freq: Once | INTRAVENOUS | Status: AC
  Administered 2023-02-09: 500 mL via INTRAVENOUS

## 2023-02-09 MED ORDER — LACTATED RINGERS IV SOLN: INTRAVENOUS | Status: DC

## 2023-02-09 MED ORDER — GABAPENTIN 100 MG PO CAPS: 200.0000 mg | ORAL_CAPSULE | Freq: Two times a day (BID) | ORAL | Status: DC

## 2023-02-09 MED ORDER — TAMSULOSIN HCL 0.4 MG PO CAPS: 0.8000 mg | ORAL_CAPSULE | Freq: Every day | ORAL | Status: DC

## 2023-02-09 MED ORDER — SODIUM CHLORIDE 0.9 % IV SOLN: 8.0000 mg | Freq: Four times a day (QID) | INTRAVENOUS | Status: DC | PRN

## 2023-02-09 MED ORDER — NAPHAZOLINE-GLYCERIN 0.012-0.25 % OP SOLN
1.0000 [drp] | Freq: Four times a day (QID) | OPHTHALMIC | Status: DC | PRN
  Filled 2023-02-09: qty 15

## 2023-02-09 MED ORDER — POTASSIUM CHLORIDE CRYS ER 20 MEQ PO TBCR
40.0000 meq | EXTENDED_RELEASE_TABLET | Freq: Once | ORAL | Status: AC
  Administered 2023-02-09: 40 meq via ORAL
  Filled 2023-02-09: qty 2

## 2023-02-09 MED ORDER — MAGIC MOUTHWASH
15.0000 mL | Freq: Four times a day (QID) | ORAL | Status: DC | PRN
  Filled 2023-02-09: qty 15

## 2023-02-09 MED ORDER — OXYMETAZOLINE HCL 0.05 % NA SOLN
1.0000 | Freq: Two times a day (BID) | NASAL | Status: DC | PRN
  Filled 2023-02-09 (×2): qty 30

## 2023-02-09 MED ORDER — SALINE SPRAY 0.65 % NA SOLN
1.0000 | NASAL | Status: DC | PRN
  Filled 2023-02-09: qty 44

## 2023-02-09 MED ORDER — TAMSULOSIN HCL 0.4 MG PO CAPS
0.8000 mg | ORAL_CAPSULE | Freq: Every day | ORAL | 0 refills | Status: DC
  Filled 2023-02-09: qty 30, 15d supply, fill #0

## 2023-02-09 MED ORDER — MUPIROCIN 2 % EX OINT
1.0000 | TOPICAL_OINTMENT | Freq: Two times a day (BID) | CUTANEOUS | Status: DC
  Administered 2023-02-09: 1 via NASAL
  Filled 2023-02-09: qty 22

## 2023-02-09 MED ORDER — MAGNESIUM SULFATE 4 GM/100ML IV SOLN
4.0000 g | Freq: Once | INTRAVENOUS | Status: AC
  Administered 2023-02-09: 4 g via INTRAVENOUS
  Filled 2023-02-09: qty 100

## 2023-02-09 MED ORDER — MENTHOL 3 MG MT LOZG: 1.0000 | LOZENGE | OROMUCOSAL | Status: DC | PRN

## 2023-02-09 NOTE — TOC Transition Note (Signed)
Per MD, pt ready for dc back to [facility] where he is a LTC resident. Unable to reach [facility] admissions this late in the day. RN reports she spoke with the nurse/Joyce at [facility] who confirmed they would accept pt. MD and RN have spoken to family re return to [facility] today. [facility] arranged for transport. SW signing off.   Dellie Burns, MSW, LCSW [phone] (coverage)

## 2023-02-09 NOTE — Assessment & Plan Note (Addendum)
Most likely due to abdominal infection. Could also be delirium vs. Delirium. Yelling at hospital staff repeatedly and threw ostomy bag. S/p valium 5mg  with some improvement in behavior yesterday. Patient appears to be back to baseline, Alert and oriented to place, situation and self.  - UDS pending - home melatonin 10mg  and seroquel 100mg  nightly - holding home gabapentin, hydralazine - call daughter to get a better idea of baseline

## 2023-02-09 NOTE — Assessment & Plan Note (Addendum)
Home regimen: Lantus 15 u daily and humalog SSI. Holding basal insulin at this time, given limited PO intake. Sugars 90-110s.  - Advance diet as tolerating  - SSI (sensitive)  - CBG AC, at bedtime

## 2023-02-09 NOTE — Progress Notes (Signed)
PHARMACY - PHYSICIAN COMMUNICATION CRITICAL VALUE ALERT - BLOOD CULTURE IDENTIFICATION (BCID)  Gregory [name] is an 62 y.o. male who presented to [facility] on 02/08/2023 with a chief complaint of abdominal pain and N/V, possible abdominal abscess  Assessment:   1/2 blood cultures growing Staphylococcus species, likely contaminant  Name of physician (or Provider) Contacted:  Dr. Royal Piedra  Current antibiotics:  None  Changes to prescribed antibiotics recommended:  Consider adding Rocephin for abdominal abscess  Results for orders placed or performed during the hospital encounter of 02/08/23  Blood Culture ID Panel (Reflexed) (Collected: 02/08/2023 11:57 AM)  Result Value Ref Range   Enterococcus faecalis NOT DETECTED NOT DETECTED   Enterococcus Faecium NOT DETECTED NOT DETECTED   Listeria monocytogenes NOT DETECTED NOT DETECTED   Staphylococcus species DETECTED (A) NOT DETECTED   Staphylococcus aureus (BCID) NOT DETECTED NOT DETECTED   Staphylococcus epidermidis NOT DETECTED NOT DETECTED   Staphylococcus lugdunensis NOT DETECTED NOT DETECTED   Streptococcus species NOT DETECTED NOT DETECTED   Streptococcus agalactiae NOT DETECTED NOT DETECTED   Streptococcus pneumoniae NOT DETECTED NOT DETECTED   Streptococcus pyogenes NOT DETECTED NOT DETECTED   A.calcoaceticus-baumannii NOT DETECTED NOT DETECTED   Bacteroides fragilis NOT DETECTED NOT DETECTED   Enterobacterales NOT DETECTED NOT DETECTED   Enterobacter cloacae complex NOT DETECTED NOT DETECTED   Escherichia coli NOT DETECTED NOT DETECTED   Klebsiella aerogenes NOT DETECTED NOT DETECTED   Klebsiella oxytoca NOT DETECTED NOT DETECTED   Klebsiella pneumoniae NOT DETECTED NOT DETECTED   Proteus species NOT DETECTED NOT DETECTED   Salmonella species NOT DETECTED NOT DETECTED   Serratia marcescens NOT DETECTED NOT DETECTED   Haemophilus influenzae NOT DETECTED NOT DETECTED   Neisseria meningitidis NOT DETECTED NOT DETECTED    Pseudomonas aeruginosa NOT DETECTED NOT DETECTED   Stenotrophomonas maltophilia NOT DETECTED NOT DETECTED   Candida albicans NOT DETECTED NOT DETECTED   Candida auris NOT DETECTED NOT DETECTED   Candida glabrata NOT DETECTED NOT DETECTED   Candida krusei NOT DETECTED NOT DETECTED   Candida parapsilosis NOT DETECTED NOT DETECTED   Candida tropicalis NOT DETECTED NOT DETECTED   Cryptococcus neoformans/gattii NOT DETECTED NOT DETECTED    [name] 02/09/2023  7:34 AM

## 2023-02-09 NOTE — Progress Notes (Signed)
Nursing Discharge Note  Name: Gregory [name] MRN: [id number] DOB: 21-Dec-1960  Admit Date: 02/08/2023 Discharge Date: 02/09/2023  Gregory [name] is to be discharged to a Skilled Nursing Facility per MD order.  AVS completed, placed in discharge packet for facility review. Discharge packet compiled for facility. Non-emergency ambulance transport arranged. Report called to Comptroller  at [facility].   Allergies as of 02/09/2023       Reactions   Latex Other (See Comments)   "ALLERGIC," per [facility]        Medication List     STOP taking these medications    cyanocobalamin 1000 MCG tablet   gabapentin 100 MG capsule Commonly known as: NEURONTIN       TAKE these medications    acetaminophen 500 MG tablet Commonly known as: TYLENOL Take 1,000 mg by mouth every 8 (eight) hours as needed for mild pain.   aspirin EC 81 MG tablet Take 81 mg by mouth daily. Swallow whole.   atorvastatin 80 MG tablet Commonly known as: LIPITOR Take 1 tablet (80 mg total) by mouth daily. What changed: when to take this   carvedilol 12.5 MG tablet Commonly known as: COREG Take 1 tablet (12.5 mg total) by mouth 2 (two) times daily with a meal.   cetirizine 10 MG tablet Commonly known as: ZYRTEC Take 10 mg by mouth daily.   Cholecalciferol 25 MCG (1000 UT) capsule Take 1,000 Units by mouth daily.   Cranberry 450 MG Caps Take 450 mg by mouth daily.   cyclobenzaprine 5 MG tablet Commonly known as: FLEXERIL Take 5 mg by mouth every 12 (twelve) hours as needed for muscle spasms.   escitalopram 20 MG tablet Commonly known as: LEXAPRO Take 20 mg by mouth daily.   ferrous sulfate 325 (65 FE) MG tablet Take 1 tablet (325 mg total) by mouth 3 (three) times daily with meals. What changed:  when to take this additional instructions   fluticasone 50 MCG/ACT nasal spray Commonly known as: FLONASE Place 1 spray into both nostrils daily.   furosemide 40 MG tablet Commonly known as: LASIX Take 40  mg by mouth daily.   hydrALAZINE 10 MG tablet Commonly known as: APRESOLINE Take 1 tablet (10 mg total) by mouth every 8 (eight) hours.   hydrOXYzine 25 MG tablet Commonly known as: ATARAX Take 25 mg by mouth every 6 (six) hours as needed for anxiety.   hydrOXYzine 50 MG capsule Commonly known as: VISTARIL Take 50 mg by mouth at bedtime.   insulin lispro 100 UNIT/ML KwikPen Commonly known as: HUMALOG Inject 0-10 Units into the skin See admin instructions. Inject 0-10 units into the skin three times a day and at bedtime, PER SLIDING SCALE:  BGL 151-200 = 2 units 201-250 = 4 units 251-300 = 6 units 301-350 =  8 units 351-400 = 10 units What changed: additional instructions   ipratropium-albuterol 0.5-2.5 (3) MG/3ML Soln Commonly known as: DUONEB Take 3 mLs by nebulization every 6 (six) hours as needed (for shortness of breath).   Lantus SoloStar 100 UNIT/ML Solostar Pen Generic drug: insulin glargine Inject 15 Units into the skin daily. What changed: when to take this   lidocaine 5 % Commonly known as: LIDODERM Place 2 patches onto the skin daily. Remove & Discard patch within 12 hours or as directed by MD   loperamide 2 MG capsule Commonly known as: IMODIUM Take 1 capsule (2 mg total) by mouth 2 (two) times daily.   magnesium oxide 400 (240  Mg) MG tablet Commonly known as: MAG-OX Take 400 mg by mouth in the morning, at noon, and at bedtime.   Melatonin 10 MG Tabs Take 10 mg by mouth at bedtime.   mirabegron ER 50 MG Tb24 tablet Commonly known as: MYRBETRIQ Take 50 mg by mouth daily.   multivitamin with minerals Tabs tablet Take 1 tablet by mouth daily.   Narcan 4 MG/0.1ML Liqd nasal spray kit Generic drug: naloxone Place 1 spray into the nose as needed (poorly responsive or turning blue).   Natural Balance Tears 0.1-0.3 % Soln Generic drug: Dextran 70-Hypromellose Place 1 drop into both eyes every 4 (four) hours as needed (dry eyes).   nystatin  powder Commonly known as: MYCOSTATIN/NYSTOP Apply 1 Application topically daily. To groin/scrotum   omeprazole 20 MG capsule Commonly known as: PRILOSEC Take 20 mg by mouth at bedtime.   ondansetron 4 MG tablet Commonly known as: ZOFRAN Take 4 mg by mouth every 8 (eight) hours as needed for nausea.   Oxycodone HCl 10 MG Tabs Take 10 mg by mouth every 4 (four) hours. Hold for sedation.   oxymetazoline 0.05 % nasal spray Commonly known as: AFRIN Place 1 spray into both nostrils 2 (two) times daily as needed for congestion.   pregabalin 100 MG capsule Commonly known as: LYRICA Take 1 capsule (100 mg total) by mouth 2 (two) times daily.   QUEtiapine 100 MG tablet Commonly known as: SEROQUEL Take 100 mg by mouth at bedtime.   sodium bicarbonate 650 MG tablet Take 650 mg by mouth 2 (two) times daily.   tamsulosin 0.4 MG Caps capsule Commonly known as: FLOMAX Take 2 capsules (0.8 mg total) by mouth daily. What changed: how much to take   trolamine salicylate 10 % cream Commonly known as: ASPERCREME Apply 1 Application topically every 6 (six) hours as needed for muscle pain. Right leg   Trulicity 0.75 MG/0.5ML Sopn Generic drug: Dulaglutide Inject 0.75 mg into the skin every Thursday.   Uro-MP 118 MG Caps Take 118 mg by mouth 2 (two) times daily.               Discharge Care Instructions  (From admission, onward)           Start     Ordered   02/09/23 0000  Discharge wound care:       Comments: Assess ostomy integrity for leaks   02/09/23 1823             Discharge Instructions     Call MD for:  persistant nausea and vomiting   Complete by: As directed    Call MD for:  severe uncontrolled pain   Complete by: As directed    Diet - low sodium heart healthy   Complete by: As directed    Discharge wound care:   Complete by: As directed    Assess ostomy integrity for leaks   Increase activity slowly   Complete by: As directed          PTAR   to provide transportation to facility for patient. Awaiting PTAR to come pick up patient.

## 2023-02-09 NOTE — Discharge Instructions (Addendum)
Dear [name],   Thank you so much for allowing [place] to be part of your care!  You were admitted to [facility] for nausea, vomiting and abdominal pain.  You had a CT image which was suspicious for an abscess near spleen.  Our surgery colleagues were consulted and believed that you could be treated with antibiotics and did not require surgery.  You got a short course of antibiotics which were discontinued prior to discharge.  Please follow-up with surgery outpatient with Dr. Sheliah Hatch.   POST-HOSPITAL & CARE INSTRUCTIONS Please let PCP/Specialists know of any changes that were made.  Please see medications section of this packet for any medication changes.   DOCTOR'S APPOINTMENT & FOLLOW UP CARE INSTRUCTIONS  Future Appointments  Date Time Provider Department Center  10/01/2023  9:15 AM Rennis Chris, MD [facility] None   RETURN PRECAUTIONS:   Take care and be well!  Family Medicine Teaching Service  [place]  [facility]  [address] [place], [place] [address] [phone]

## 2023-02-09 NOTE — Assessment & Plan Note (Addendum)
Creatinine appears to be at baseline. S/p 1L bolus NS in ED. -AM BMP, Mag  - Cr 2.07 elevated, K 3.3,  Mag 1.1 -replete K, Mag as needed

## 2023-02-09 NOTE — Progress Notes (Signed)
Patient discharging back to SNF but PTAR not here yet. Report given to [facility] at this time. Handoff completed at this time.  Discharge packet compiled and placed at nurses station.

## 2023-02-09 NOTE — Discharge Summary (Addendum)
Family Medicine Teaching [facility] Discharge Summary  Patient name: Gregory [name] Medical record number: [id number] Date of birth: Jan 13, 1961 Age: 62 y.o. 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 His hospital course is detailed below:  Abscess of abdominal cavity Pt initially presented with N/V and abdominal pain. CT A/P obtained showing mild wall thickening involving [name], and a 5.4cm fluid collection between inferior margin of spleen and left anterior abdominal wall, suspicious for abscess. General surgery was consulted, recommended advancing diet and does not need emergency surgery. IR was consulted and mass was said to not be amenable to drainage. Patient was given one-time dose of Zosyn and short stent of ceftriaxone during admission. Antibiotics were discontinued and patient was afebrile. Surgery considered fluid collection more likely hematoma vs seroma than true abscess.  Patient was controlled on IV and oral pain medications.  Pain was controlled on PO medication prior to discharge. Discussed with [facility] prior to discharge that patient will need assistance for ostomy bag change and monitoring. It will need new pouching. Surgery elected to follow-up outpatient with Dr. Sheliah Hatch and consideration for ileostomy takedown in the future.   Altered Mental Status Patient was yelling at hospital staff repeatedly during the night of admission and threw his ostomy bag.  Required Valium 5  mg with some improvement. Home gabapentin and hydralazine were held. He was restarted on his home Seroquel 100 mg nightly and melatonin 10 mg for sleep.  Anemia Patient has a history of chronic anemia with Hgb of 8.2. Hgb dropped to 7 on 8/2. Suspected due to chronic disease and possibly dilutional.   Other chronic conditions were medically managed with home medications and formulary alternatives as necessary (T2DM, CKD 3, ileostomy) Type 2DM: Lantus 15u daily (held) and humalog SSI, SSI sensitive  PCP Follow-up Recommendations: Follow-up CBC   Discharge Diagnoses/Problem List:  Active Problems:   Type II diabetes mellitus with neurological manifestations (HCC)   Chronic kidney disease, stage 3b (HCC)   Altered mental status   Abscess of abdominal cavity (HCC)   High output ileostomy (HCC)  Disposition: SNF   Discharge Condition: Stable   Discharge Exam:  General: Middle aged lying in bed,  Cardiovascular: RRR Respiratory: CTAB Gastrointestinal: Bowel sounds present, soft, nontender, non, distended, ileostomy in place on R  MSK: Right AKA stump appears normal, no swelling to left lower extremity Derm: Warm, dry  Significant Procedures: Ileostomy replacement   Significant Labs and Imaging:  Recent Labs  Lab 02/08/23 1101 02/09/23 0546  WBC 8.0 16.3*  HGB 8.2* 7.0*  HCT 27.9* 23.4*  PLT 307 258   Recent Labs  Lab 02/08/23 1101 02/09/23 0546  NA 138 132*  K 3.3* 3.3*  CL 103 100  CO2 25 22  GLUCOSE 145* 105*  BUN 26* 29*  CREATININE 1.91* 2.07*  CALCIUM 8.6* 7.8*  MG  --  1.2*  ALKPHOS 114  --   AST 14*  --   ALT 11  --   ALBUMIN 2.2*  --    Results/Tests Pending  at Time of Discharge: None   Discharge Medications:  Allergies as of 02/09/2023       Reactions   Latex Other (See Comments)   "ALLERGIC," per [facility]        Medication List     STOP taking these medications    cyanocobalamin 1000 MCG tablet   gabapentin 100 MG capsule Commonly known  as: NEURONTIN       TAKE these medications    acetaminophen 500 MG tablet Commonly known as: TYLENOL Take 1,000 mg by mouth every 8 (eight) hours as needed for mild pain.   aspirin EC 81 MG tablet Take 81 mg by mouth daily. Swallow whole.   atorvastatin 80 MG tablet Commonly known as: LIPITOR Take 1 tablet (80 mg total) by mouth daily. What changed: when to take this   carvedilol 12.5 MG tablet Commonly known as: COREG Take 1 tablet (12.5 mg total) by mouth 2 (two) times daily with a meal.   cetirizine 10 MG tablet Commonly known as: ZYRTEC Take 10 mg by mouth daily.   Cholecalciferol 25 MCG (1000 UT) capsule Take 1,000 Units by mouth daily.   Cranberry 450 MG Caps Take 450 mg by mouth daily.   cyclobenzaprine 5 MG tablet Commonly known as: FLEXERIL Take 5 mg by mouth every 12 (twelve) hours as needed for muscle spasms.   escitalopram 20 MG tablet Commonly known as: LEXAPRO Take 20 mg by mouth daily.   ferrous sulfate 325 (65 FE) MG tablet Take 1 tablet (325 mg total) by mouth 3 (three) times daily with meals. What changed:  when to take this additional instructions   fluticasone 50 MCG/ACT nasal spray Commonly known as: FLONASE Place 1 spray into both nostrils daily.   furosemide 40 MG tablet Commonly known as: LASIX Take 40 mg by mouth daily.   hydrALAZINE 10 MG tablet Commonly known as: APRESOLINE Take 1 tablet (10 mg total) by mouth every 8 (eight) hours.   hydrOXYzine 25 MG tablet Commonly known as: ATARAX Take 25 mg by mouth every 6 (six) hours as needed for anxiety.   hydrOXYzine 50 MG capsule Commonly known as: VISTARIL Take 50 mg by mouth at bedtime.   insulin lispro 100 UNIT/ML KwikPen Commonly known as: HUMALOG Inject 0-10 Units into the skin See admin instructions. Inject 0-10 units into the skin three times a day and at bedtime, PER SLIDING SCALE:  BGL 151-200 = 2 units 201-250 = 4 units 251-300 = 6 units 301-350 =  8  units 351-400 = 10 units What changed: additional instructions   ipratropium-albuterol 0.5-2.5 (3) MG/3ML Soln Commonly known as: DUONEB Take 3 mLs by nebulization every 6 (six) hours as needed (for shortness of breath).   Lantus SoloStar 100 UNIT/ML Solostar Pen Generic drug: insulin glargine Inject 15 Units into the skin daily. What changed: when to take this   lidocaine 5 % Commonly known as: LIDODERM Place 2 patches onto the skin daily. Remove & Discard patch within 12 hours or as directed by MD   loperamide 2 MG capsule Commonly known as: IMODIUM Take 1 capsule (2 mg total) by mouth 2 (two) times daily.   magnesium oxide 400 (240 Mg) MG tablet Commonly known as: MAG-OX Take 400 mg by mouth in the morning, at noon, and at bedtime.   Melatonin 10 MG Tabs Take 10 mg by mouth at bedtime.   mirabegron ER 50 MG Tb24 tablet Commonly known as: MYRBETRIQ Take 50 mg by mouth daily.  multivitamin with minerals Tabs tablet Take 1 tablet by mouth daily.   Narcan 4 MG/0.1ML Liqd nasal spray kit Generic drug: naloxone Place 1 spray into the nose as needed (poorly responsive or turning blue).   Natural Balance Tears 0.1-0.3 % Soln Generic drug: Dextran 70-Hypromellose Place 1 drop into both eyes every 4 (four) hours as needed (dry eyes).   nystatin powder Commonly known as: MYCOSTATIN/NYSTOP Apply 1 Application topically daily. To groin/scrotum   omeprazole 20 MG capsule Commonly known as: PRILOSEC Take 20 mg by mouth at bedtime.   ondansetron 4 MG tablet Commonly known as: ZOFRAN Take 4 mg by mouth every 8 (eight) hours as needed for nausea.   Oxycodone HCl 10 MG Tabs Take 10 mg by mouth every 4 (four) hours. Hold for sedation.   oxymetazoline 0.05 % nasal spray Commonly known as: AFRIN Place 1 spray into both nostrils 2 (two) times daily as needed for congestion.   pregabalin 100 MG capsule Commonly known as: LYRICA Take 1 capsule (100 mg total) by mouth 2  (two) times daily.   QUEtiapine 100 MG tablet Commonly known as: SEROQUEL Take 100 mg by mouth at bedtime.   sodium bicarbonate 650 MG tablet Take 650 mg by mouth 2 (two) times daily.   tamsulosin 0.4 MG Caps capsule Commonly known as: FLOMAX Take 2 capsules (0.8 mg total) by mouth daily. What changed: how much to take   trolamine salicylate 10 % cream Commonly known as: ASPERCREME Apply 1 Application topically every 6 (six) hours as needed for muscle pain. Right leg   Trulicity 0.75 MG/0.5ML Sopn Generic drug: Dulaglutide Inject 0.75 mg into the skin every Thursday.   Uro-MP 118 MG Caps Take 118 mg by mouth 2 (two) times daily.               Discharge Care Instructions  (From admission, onward)           Start     Ordered   02/09/23 0000  Discharge wound care:       Comments: Assess ostomy integrity for leaks   02/09/23 1823           Discharge Instructions: Please refer to Patient Instructions section of EMR for full details.  Patient was counseled important signs and symptoms that should prompt return to medical care, changes in medications, dietary instructions, activity restrictions, and follow up appointments.   Follow-Up Appointments:  Follow-up Information     Kinsinger, De Blanch, MD Follow up on 02/21/2023.   Specialty: General Surgery Why: 150pm. Please bring a copy of your photo ID and insurance card. Please arrive 30 minutes prior to your appointment Contact information: [address] [place] [address] [phone]                Peterson Ao, MD 02/09/2023, 5:33 PM PGY-1, [facility] Family Medicine  I was personally present and performed medical decision making activities of this service and have verified that the service and findings are accurately documented in the student's note.  Shelby Mattocks, DO                  02/09/2023, 6:24 PM

## 2023-02-09 NOTE — Plan of Care (Signed)

## 2023-02-09 NOTE — Hospital Course (Addendum)
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 His hospital course is detailed below:  Abscess of abdominal cavity Pt initially presented with N/V and abdominal pain. CT A/P obtained showing mild wall thickening involving [name], and a 5.4cm fluid collection between inferior margin of spleen and left anterior abdominal wall, suspicious for abscess. General surgery was consulted, recommended advancing diet and does not need emergency surgery. IR was consulted and mass was said to not be amenable to drainage. Patient was given one-time dose of Zosyn and short stent of ceftriaxone during admission. Antibiotics were discontinued and patient was afebrile. Surgery considered fluid collection more likely hematoma vs seroma than true abscess.  Patient was controlled on IV and oral pain medications.  Pain was controlled on PO medication prior to discharge. Discussed with [facility] prior to discharge that patient will need assistance for ostomy bag change and monitoring. It will need new pouching. Surgery elected to follow-up outpatient with Dr. Sheliah Hatch and consideration for ileostomy takedown in the future.   Altered Mental Status Patient was yelling at hospital staff repeatedly during the night of admission and threw his ostomy bag.  Required Valium 5 mg with some improvement. Home gabapentin and hydralazine were held. He was restarted on his home Seroquel 100 mg nightly and melatonin 10 mg for sleep.  Anemia Patient has a history of chronic anemia with Hgb of 8.2. Hgb dropped to 7 on 8/2. Suspected due to chronic disease and possibly dilutional.   Other chronic conditions were medically managed with home medications and formulary alternatives as necessary (T2DM, CKD 3, ileostomy) Type 2DM:  Lantus 15u daily (held) and humalog SSI, SSI sensitive  PCP Follow-up Recommendations: Follow-up CBC

## 2023-02-09 NOTE — Progress Notes (Signed)
Daily Progress Note Intern Pager: [phone]  Patient name: Gregory [name] Medical record number: [id number] Date of birth: [birth year] Age: 62 y.o. Gender: male  Primary Care Provider: [name], Provider, MD Consultants: None  Code Status: Full code   Pt Overview and Major Events to Date:  8/23 Admitted, zosyn x1  8/24 Discontinue abx, no need for surgery currently   Assessment and Plan: ARUN BAGGARLY is a 62 y.o. male w/ PMH ischemic bowel s/p colectomy, presenting with nausea, vomiting, and abdominal pain found to have a fluid collection in the LUQ. CT suspects perisplenic abscess. Patient currently afebrile w/ elevated WBC 16 this AM. S/p zosyn x1 and ceftriaxone. Surgery, following, discontinue abx and no need for surgery right now and ADAT. Consider IR drainage if symptoms worsen. Patient may be stable for return to [facility] this afternoon, if tolerating PO. Follow-up PM labs.  Assessment & Plan Abscess of abdominal cavity (HCC) Fluid mass found on CTAP.  IR consulted, mass not amenable to drainage. Got Zosyn and 1L bolus NS in the ED and small . After decision was made not to take pt to the OR, pt was given diet and was tolerating PO. WBC 16.3 elevated, afebrile. Continuing to have pain oxy prns given x3 and dilaudid x1 (AM). Patient was most recently on a short course of Oxy 10 mg q4h on Tuesday. Not currently on chronic pain medications otherwise. Will discontinue dilaudid.  - WBC 16.3 elevated compared to prior 8.0 yesterday - Surgery: no need for antibiotics, and advance diet. If worsens contact IR  -Advance as tolerated, motivated to eat this morning - scheduled tylenol q6hrs - home pregabalin 100 mg BID daily - oxycodone 5mg  q4hr PRN for moderate pain x3 last 24 hours  - Hgb 7.0, patient asymptomatic, suspect possible dilutional component, if drops below 7 will transfuse  -serial abdominal exams -f/u blood cultures  Bcx arm: prelim: NG   Bcx hand: gram positive cocci    Bcx ID panel: Staph species, pharmacy suspects contaminant   Altered mental status Most likely due to abdominal infection. Could also be delirium vs. Delirium. Yelling at hospital staff repeatedly and threw ostomy bag. S/p valium 5mg  with some improvement in behavior yesterday. Patient appears to be back to baseline, Alert and oriented to place, situation and self.  - UDS pending - home melatonin 10mg  and seroquel 100mg  nightly - holding home gabapentin, hydralazine - call daughter to get a better idea of baseline Chronic kidney disease, stage 3b (HCC) Creatinine appears to be at baseline. S/p 1L bolus NS in ED. -AM BMP, Mag  - Cr 2.07 elevated, K 3.3,  Mag 1.1 -replete K, Mag as needed Type II diabetes mellitus with neurological manifestations (HCC) Home regimen: Lantus 15 u daily and humalog SSI. Holding basal insulin at this time, given limited PO intake. Sugars 90-110s.  - Advance diet as tolerating  - SSI (sensitive)  - CBG AC, at bedtime High output ileostomy (HCC) Surgery recs, will follow Monday if still admitted,  Consider  - fiber con twice BID daily, iron BID, imodium twice daily prn  - wound ostomy consult ordered  FEN/GI: Advance diet as tolerated  PPx: Heparin  Dispo:SNF today. Barriers include tolerating PO w/o nausea or vomiting   Subjective:  Patient has some pain this morning. Mostly at   Objective: Temp:  [97.6 F (36.4 C)-99.4 F (37.4 C)] 98 F (36.7 C) (08/24 0444) Pulse Rate:  [71-96] 84 (08/24 0722) Resp:  [17-19]  18 (08/24 0722) BP: (87-152)/(51-103) 91/56 (08/24 0722) SpO2:  [91 %-100 %] 96 % (08/24 0722) Weight:  [230 lb (104.3 kg)] 230 lb (104.3 kg) (08/23 1053)  Physical Exam: General: Middle aged, rubbing right leg,  Cardiovascular: RRR Respiratory: CTAB Gastrointestinal: Bowel sounds present, soft, nontender, non, distended, ileostomy in place on R  MSK: Right AKA stump appears normal, no swelling to left lower extremity Derm: Warm,  dry  Laboratory: Most recent CBC      Latest Ref Rng & Units 02/09/2023    5:46 AM 02/08/2023   11:01 AM 01/23/2023    1:28 PM  CBC  WBC 4.0 - 10.5 K/uL 16.3  8.0  9.5   Hemoglobin 13.0 - 17.0 g/dL 7.0  8.2  7.4   Hematocrit 39.0 - 52.0 % 23.4  27.9  25.1   Platelets 150 - 400 K/uL 258  307  282         Latest Ref Rng & Units 02/09/2023    5:46 AM 02/08/2023   11:01 AM 01/23/2023    1:28 PM  BMP  Glucose 70 - 99 mg/dL 696  295  284   BUN 8 - 23 mg/dL 29  26  21    Creatinine 0.61 - 1.24 mg/dL 1.32  4.40  1.02   Sodium 135 - 145 mmol/L 132  138  135   Potassium 3.5 - 5.1 mmol/L 3.3  3.3  3.2   Chloride 98 - 111 mmol/L 100  103  97   CO2 22 - 32 mmol/L 22  25  26    Calcium 8.9 - 10.3 mg/dL 7.8  8.6  8.2     Imaging/Diagnostic Tests:  CT AP:   Status post total colectomy and right abdominal end ileostomy.   Mild wall thickening involving the [name], consistent with proctitis.   5.4 cm fluid collection between the inferior margin of the spleen and left anterior abdominal wall, suspicious for abscess.   Cholelithiasis. No radiographic evidence of cholecystitis.   Unchanged left posterior 10th rib fracture.   Peterson Ao, MD 02/09/2023, 9:19 AM  PGY-1, [facility] FPTS Intern pager: [phone], text pages welcome Secure chat group [facility] Teaching Service

## 2023-02-09 NOTE — Assessment & Plan Note (Signed)
Surgery recs, will follow Monday if still admitted,  Consider  - fiber con twice BID daily, iron BID, imodium twice daily prn  - wound ostomy consult ordered

## 2023-02-09 NOTE — Progress Notes (Signed)
02/09/2023  Gregory [name] [id number] April 14, 1961  CARE TEAM: PCP: [name], Provider, MD  Outpatient Care Team: Patient Care Team: [name], Provider, MD as PCP - General  Inpatient Treatment Team: Treatment Team:  Billey Co, MD Ccs, Md, MD [name], [place] [name], LCSW Kallie Locks, RN Swist, Debbie, RN Carlynn Herald, RN Bess Kinds, RN   Problem List:   Active Problems:   Type II diabetes mellitus with neurological manifestations [facility])   Chronic kidney disease, stage 3b [facility])   Altered mental status   Abscess of abdominal cavity (HCC)   Hypokalemia   Hypomagnesemia   12/04/2022  Postoperative diagnosis: ischemic colon   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     Assessment [facility] Stay = 1 days)      OK    Plan:  -No evidence of obstruction at this time.  He does have a fluid collection left upper quadrant that abuts the spleen.   It is certainly accessible with needle aspiration under ultrasound guidance but intervention urology declining to do that at this time.  He has no pain there and he is more than 6 weeks out from surgery which would argue against any significant abscess.  More likely has seroma or hematoma from his emergent abdominal colectomy 2 months ago   I would follow off antibiotics and advance diet.  If he worsens, then make interventional radiology put a drain in there or at least aspirate  to r/o abscess.  Sure it is near the spleen, but risks of no intervention outweigh that.  That should be easily accessed under ultrasound only.  Advance to solid diet as tolerated.  Will switch to soft diet since to be tolerating clears and he is thirsty.  Suspect he just has chronic nausea baseline.  He has an ileostomy is higher risk for high output.  Elevated creatinine pointed to that as well.  Resume FiberCon twice daily, iron twice daily, Imodium twice daily and as  needed.  Midline wound open to air.  I will put a dry dressing over it.  Ileostomy with chronic leaking.  Needs new pouching.  Could benefit from wound ostomy consultation to help troubleshoot that if needed.  VTE prophylaxis- SCDs, etc  mobilize as tolerated to help recovery  Outpatient follow-up with Dr. Sheliah Hatch for recovery.  Theoretically the patient could benefit from ileostomy takedown but he is not the risk and he has no interest in surgery at this time.  I certainly would wait 6-12 months before considering then defer to operating surgeon, Dr. Sheliah Hatch.  -Disposition: Per primary service.  Surgery will follow-up Monday if he is still here       I reviewed nursing notes, ED provider notes, hospitalist notes, last 24 h vitals and pain scores, last 48 h intake and output, last 24 h labs and trends, and last 24 h imaging results.  I have reviewed this patient's available data, including medical history, events of note, test results, etc as part of my evaluation.   A significant portion of that time was spent in counseling. Care during the described time interval was provided by me.  This care required moderate level of medical decision making.  02/09/2023    Subjective: (Chief complaint)  Nursing in room.  Patient on phone with daughter.  Needs as Afrin spray.  Tolerating liquids and wants to eat more.  Did have a little bit of nausea.  No more  vomiting.  Objective:  Vital signs:  Vitals:   02/08/23 2112 02/09/23 0444 02/09/23 0530 02/09/23 0722  BP: 101/60 (!) 87/51 92/60 (!) 91/56  Pulse: 88 86  84  Resp: 18 18  18   Temp: 98.1 F (36.7 C) 98 F (36.7 C)    TempSrc: Oral Oral  Oral  SpO2: 94% 91%  96%  Weight:      Height:        Last BM Date : 02/08/23 (Colostomy bag)  Intake/Output   Yesterday:  08/23 0701 - 08/24 0700 In: -  Out: 425 [Stool:425] This shift:  Total I/O In: 940.6 [P.O.:420; I.V.:519.1; IV Piggyback:1.6] Out: 300  [Stool:300]  Bowel function:  Flatus: YES  BM:  YES -thin bilious effluent in ileostomy bag  Drain: (No drain)   Physical Exam:  General: Pt awake/alert in no acute distress Eyes: PERRL, normal EOM.  Sclera clear.  No icterus Neuro: CN II-XII intact w/o focal sensory/motor deficits. Lymph: No head/neck/groin lymphadenopathy Psych:  No delerium/psychosis/paranoia.  Oriented x 4.  Chatty on phone.  Asking numerous questions HENT: Normocephalic, Mucus membranes moist.  No thrush Neck: Supple, No tracheal deviation.  No obvious thyromegaly Chest: No pain to chest wall compression.  Good respiratory excursion.  No audible wheezing CV:  Pulses intact.  Regular rhythm.  No major extremity edema MS: Normal AROM mjr joints.  No obvious deformity  Abdomen: Soft.  Nondistended.  Nontender.  No evidence of peritonitis.  No incarcerated hernias. Midline wound mostly epithelialized over with a few small dots.  No obvious hernia. Right-sided ileostomy with nice rosebud mound.  Leaking towards midline.  Large volume of thin succus in bag.  Ext: Right AKA stable.  Left lower extremity with chronic changes and edema.  No mjr edema.  No cyanosis Skin: No petechiae / purpurea.  No major sores.  Warm and dry    Results:   Cultures: Recent Results (from the past 720 hour(s))  SARS Coronavirus 2 by RT PCR (hospital order, performed in [facility] hospital lab) *cepheid single result test* Anterior Nasal Swab     Status: None   Collection Time: 02/08/23 11:24 AM   Specimen: Anterior Nasal Swab  Result Value Ref Range Status   SARS Coronavirus 2 by RT PCR NEGATIVE NEGATIVE Final    Comment: Performed at [facility] Lab, [address], [place], [place] [address]  Culture, blood (routine x 2)     Status: None (Preliminary result)   Collection Time: 02/08/23 11:57 AM   Specimen: BLOOD RIGHT HAND  Result Value Ref Range Status   Specimen Description BLOOD RIGHT HAND  Final   Special Requests    Final    BOTTLES DRAWN AEROBIC ONLY Blood Culture results may not be optimal due to an inadequate volume of blood received in culture bottles   Culture  Setup Time   Final    GRAM POSITIVE COCCI IN CLUSTERS AEROBIC BOTTLE ONLY CRITICAL RESULT CALLED TO, READ BACK BY AND VERIFIED WITH: PHARMD G. ABOTT ON [id number] @0721  BY SM Performed at [facility] Lab, [address], [place], [place] [address]    Culture GRAM POSITIVE COCCI  Final   Report Status PENDING  Incomplete  Culture, blood (routine x 2)     Status: None (Preliminary result)   Collection Time: 02/08/23 11:57 AM   Specimen: BLOOD LEFT ARM  Result Value Ref Range Status   Specimen Description BLOOD LEFT ARM  Final   Special Requests  Final    BOTTLES DRAWN AEROBIC ONLY Blood Culture results may not be optimal due to an inadequate volume of blood received in culture bottles   Culture   Final    NO GROWTH < 24 HOURS Performed at [facility], [address] [place], [place] [address]    Report Status PENDING  Incomplete  Blood Culture ID Panel (Reflexed)     Status: Abnormal   Collection Time: 02/08/23 11:57 AM  Result Value Ref Range Status   Enterococcus faecalis NOT DETECTED NOT DETECTED Final   Enterococcus Faecium NOT DETECTED NOT DETECTED Final   Listeria monocytogenes NOT DETECTED NOT DETECTED Final   Staphylococcus species DETECTED (A) NOT DETECTED Final    Comment: CRITICAL RESULT CALLED TO, READ BACK BY AND VERIFIED WITH: PHARMD G. ABOTT ON [id number] @0721  BY SM    Staphylococcus aureus (BCID) NOT DETECTED NOT DETECTED Final   Staphylococcus epidermidis NOT DETECTED NOT DETECTED Final   Staphylococcus lugdunensis NOT DETECTED NOT DETECTED Final   Streptococcus species NOT DETECTED NOT DETECTED Final   Streptococcus agalactiae NOT DETECTED NOT DETECTED Final   Streptococcus pneumoniae NOT DETECTED NOT DETECTED Final   Streptococcus pyogenes NOT DETECTED NOT DETECTED Final   A.calcoaceticus-baumannii NOT  DETECTED NOT DETECTED Final   Bacteroides fragilis NOT DETECTED NOT DETECTED Final   Enterobacterales NOT DETECTED NOT DETECTED Final   Enterobacter cloacae complex NOT DETECTED NOT DETECTED Final   Escherichia coli NOT DETECTED NOT DETECTED Final   Klebsiella aerogenes NOT DETECTED NOT DETECTED Final   Klebsiella oxytoca NOT DETECTED NOT DETECTED Final   Klebsiella pneumoniae NOT DETECTED NOT DETECTED Final   Proteus species NOT DETECTED NOT DETECTED Final   Salmonella species NOT DETECTED NOT DETECTED Final   Serratia marcescens NOT DETECTED NOT DETECTED Final   Haemophilus influenzae NOT DETECTED NOT DETECTED Final   Neisseria meningitidis NOT DETECTED NOT DETECTED Final   Pseudomonas aeruginosa NOT DETECTED NOT DETECTED Final   Stenotrophomonas maltophilia NOT DETECTED NOT DETECTED Final   Candida albicans NOT DETECTED NOT DETECTED Final   Candida auris NOT DETECTED NOT DETECTED Final   Candida glabrata NOT DETECTED NOT DETECTED Final   Candida krusei NOT DETECTED NOT DETECTED Final   Candida parapsilosis NOT DETECTED NOT DETECTED Final   Candida tropicalis NOT DETECTED NOT DETECTED Final   Cryptococcus neoformans/gattii NOT DETECTED NOT DETECTED Final    Comment: Performed at [facility] Lab, [address] [place] [address], [place], [place] [address]  MRSA Next Gen by PCR, Nasal     Status: Abnormal   Collection Time: 02/08/23 10:12 PM   Specimen: Nasal Mucosa; Nasal Swab  Result Value Ref Range Status   MRSA by PCR Next Gen DETECTED (A) NOT DETECTED Final    Comment: RESULT CALLED TO, READ BACK BY AND VERIFIED WITH: SIDDIQUI RN 02/09/23 @ 0457 BY AB (NOTE) The GeneXpert MRSA Assay (FDA approved for NASAL specimens only), is one component of a comprehensive MRSA colonization surveillance program. It is not intended to diagnose MRSA infection nor to guide or monitor treatment for MRSA infections. Test performance is not FDA approved in patients less than 14 years old. Performed at [facility] Lab, [address], [place], [place] [address]     Labs: Results for orders placed or performed during the hospital encounter of 02/08/23 (from the past 48 hour(s))  CBC with Differential     Status: Abnormal   Collection Time: 02/08/23 11:01 AM  Result Value Ref Range  WBC 8.0 4.0 - 10.5 K/uL   RBC 3.02 (L) 4.22 - 5.81 MIL/uL   Hemoglobin 8.2 (L) 13.0 - 17.0 g/dL   HCT 16.1 (L) 09.6 - 04.5 %   MCV 92.4 80.0 - 100.0 fL   MCH 27.2 26.0 - 34.0 pg   MCHC 29.4 (L) 30.0 - 36.0 g/dL   RDW 40.9 (H) 81.1 - 91.4 %   Platelets 307 150 - 400 K/uL   nRBC 0.0 0.0 - 0.2 %   Neutrophils Relative % 72 %   Neutro Abs 5.8 1.7 - 7.7 K/uL   Lymphocytes Relative 17 %   Lymphs Abs 1.4 0.7 - 4.0 K/uL   Monocytes Relative 6 %   Monocytes Absolute 0.4 0.1 - 1.0 K/uL   Eosinophils Relative 3 %   Eosinophils Absolute 0.3 0.0 - 0.5 K/uL   Basophils Relative 1 %   Basophils Absolute 0.1 0.0 - 0.1 K/uL   Immature Granulocytes 1 %   Abs Immature Granulocytes 0.06 0.00 - 0.07 K/uL    Comment: Performed at [facility] Lab, [address], [place], [place] [address]  Comprehensive metabolic panel     Status: Abnormal   Collection Time: 02/08/23 11:01 AM  Result Value Ref Range   Sodium 138 135 - 145 mmol/L   Potassium 3.3 (L) 3.5 - 5.1 mmol/L   Chloride 103 98 - 111 mmol/L   CO2 25 22 - 32 mmol/L   Glucose, Bld 145 (H) 70 - 99 mg/dL    Comment: Glucose reference range applies only to samples taken after fasting for at least 8 hours.   BUN 26 (H) 8 - 23 mg/dL   Creatinine, Ser 6.21 (H) 0.61 - 1.24 mg/dL   Calcium 8.6 (L) 8.9 - 10.3 mg/dL   Total Protein 7.6 6.5 - 8.1 g/dL   Albumin 2.2 (L) 3.5 - 5.0 g/dL   AST 14 (L) 15 - 41 U/L   ALT 11 0 - 44 U/L   Alkaline Phosphatase 114 38 - 126 U/L   Total Bilirubin 0.6 0.3 - 1.2 mg/dL   GFR, Estimated 39 (L) >60 mL/min    Comment: (NOTE) Calculated using the CKD-EPI Creatinine Equation (2021)    Anion gap 10 5 - 15    Comment: Performed at  [facility] Lab, [address], [place], [place] [address]  Lipase, blood     Status: None   Collection Time: 02/08/23 11:01 AM  Result Value Ref Range   Lipase 19 11 - 51 U/L    Comment: Performed at [facility], [address] [place], [place] [address]  I-Stat CG4 Lactic Acid     Status: None   Collection Time: 02/08/23 11:23 AM  Result Value Ref Range   Lactic Acid, Venous 0.9 0.5 - 1.9 mmol/L  SARS Coronavirus 2 by RT PCR (hospital order, performed in [facility] hospital lab) *cepheid single result test* Anterior Nasal Swab     Status: None   Collection Time: 02/08/23 11:24 AM   Specimen: Anterior Nasal Swab  Result Value Ref Range   SARS Coronavirus 2 by RT PCR NEGATIVE NEGATIVE    Comment: Performed at [facility] Lab, [address], [place], [place] [address]  Culture, blood (routine x 2)     Status: None (Preliminary result)   Collection Time: 02/08/23 11:57 AM   Specimen: BLOOD RIGHT HAND  Result Value Ref Range   Specimen Description BLOOD RIGHT HAND    Special Requests  BOTTLES DRAWN AEROBIC ONLY Blood Culture results may not be optimal due to an inadequate volume of blood received in culture bottles   Culture  Setup Time      GRAM POSITIVE COCCI IN CLUSTERS AEROBIC BOTTLE ONLY CRITICAL RESULT CALLED TO, READ BACK BY AND VERIFIED WITH: PHARMD G. ABOTT ON [id number] @0721  BY SM Performed at [facility], [address], [place], [place] [address]    Culture GRAM POSITIVE COCCI    Report Status PENDING   Culture, blood (routine x 2)     Status: None (Preliminary result)   Collection Time: 02/08/23 11:57 AM   Specimen: BLOOD LEFT ARM  Result Value Ref Range   Specimen Description BLOOD LEFT ARM    Special Requests      BOTTLES DRAWN AEROBIC ONLY Blood Culture results may not be optimal due to an inadequate volume of blood received in culture bottles   Culture      NO GROWTH < 24 HOURS Performed at [facility] Lab, [address],  [place], [place] [address]    Report Status PENDING   Blood Culture ID Panel (Reflexed)     Status: Abnormal   Collection Time: 02/08/23 11:57 AM  Result Value Ref Range   Enterococcus faecalis NOT DETECTED NOT DETECTED   Enterococcus Faecium NOT DETECTED NOT DETECTED   Listeria monocytogenes NOT DETECTED NOT DETECTED   Staphylococcus species DETECTED (A) NOT DETECTED    Comment: CRITICAL RESULT CALLED TO, READ BACK BY AND VERIFIED WITH: PHARMD G. ABOTT ON [id number] @0721  BY SM    Staphylococcus aureus (BCID) NOT DETECTED NOT DETECTED   Staphylococcus epidermidis NOT DETECTED NOT DETECTED   Staphylococcus lugdunensis NOT DETECTED NOT DETECTED   Streptococcus species NOT DETECTED NOT DETECTED   Streptococcus agalactiae NOT DETECTED NOT DETECTED   Streptococcus pneumoniae NOT DETECTED NOT DETECTED   Streptococcus pyogenes NOT DETECTED NOT DETECTED   A.calcoaceticus-baumannii NOT DETECTED NOT DETECTED   Bacteroides fragilis NOT DETECTED NOT DETECTED   Enterobacterales NOT DETECTED NOT DETECTED   Enterobacter cloacae complex NOT DETECTED NOT DETECTED   Escherichia coli NOT DETECTED NOT DETECTED   Klebsiella aerogenes NOT DETECTED NOT DETECTED   Klebsiella oxytoca NOT DETECTED NOT DETECTED   Klebsiella pneumoniae NOT DETECTED NOT DETECTED   Proteus species NOT DETECTED NOT DETECTED   Salmonella species NOT DETECTED NOT DETECTED   Serratia marcescens NOT DETECTED NOT DETECTED   Haemophilus influenzae NOT DETECTED NOT DETECTED   Neisseria meningitidis NOT DETECTED NOT DETECTED   Pseudomonas aeruginosa NOT DETECTED NOT DETECTED   Stenotrophomonas maltophilia NOT DETECTED NOT DETECTED   Candida albicans NOT DETECTED NOT DETECTED   Candida auris NOT DETECTED NOT DETECTED   Candida glabrata NOT DETECTED NOT DETECTED   Candida krusei NOT DETECTED NOT DETECTED   Candida parapsilosis NOT DETECTED NOT DETECTED   Candida tropicalis NOT DETECTED NOT DETECTED   Cryptococcus neoformans/gattii NOT  DETECTED NOT DETECTED    Comment: Performed at [facility], [address], [place], [place] [address]  I-Stat CG4 Lactic Acid     Status: Abnormal   Collection Time: 02/08/23  1:51 PM  Result Value Ref Range   Lactic Acid, Venous 0.3 (L) 0.5 - 1.9 mmol/L  Glucose, capillary     Status: Abnormal   Collection Time: 02/08/23  9:39 PM  Result Value Ref Range   Glucose-Capillary 112 (H) 70 - 99 mg/dL    Comment: Glucose reference range applies only to samples taken after fasting  for at least 8 hours.   Comment 1 Notify RN   MRSA Next Gen by PCR, Nasal     Status: Abnormal   Collection Time: 02/08/23 10:12 PM   Specimen: Nasal Mucosa; Nasal Swab  Result Value Ref Range   MRSA by PCR Next Gen DETECTED (A) NOT DETECTED    Comment: RESULT CALLED TO, READ BACK BY AND VERIFIED WITH: SIDDIQUI RN 02/09/23 @ 0457 BY AB (NOTE) The GeneXpert MRSA Assay (FDA approved for NASAL specimens only), is one component of a comprehensive MRSA colonization surveillance program. It is not intended to diagnose MRSA infection nor to guide or monitor treatment for MRSA infections. Test performance is not FDA approved in patients less than 87 years old. Performed at [facility], [address], [place], [place] [address]   Basic metabolic panel     Status: Abnormal   Collection Time: 02/09/23  5:46 AM  Result Value Ref Range   Sodium 132 (L) 135 - 145 mmol/L   Potassium 3.3 (L) 3.5 - 5.1 mmol/L   Chloride 100 98 - 111 mmol/L   CO2 22 22 - 32 mmol/L   Glucose, Bld 105 (H) 70 - 99 mg/dL    Comment: Glucose reference range applies only to samples taken after fasting for at least 8 hours.   BUN 29 (H) 8 - 23 mg/dL   Creatinine, Ser 6.04 (H) 0.61 - 1.24 mg/dL   Calcium 7.8 (L) 8.9 - 10.3 mg/dL   GFR, Estimated 35 (L) >60 mL/min    Comment: (NOTE) Calculated using the CKD-EPI Creatinine Equation (2021)    Anion gap 10 5 - 15    Comment: Performed at [facility] Lab, [address]  [place], [place] [address]  CBC     Status: Abnormal   Collection Time: 02/09/23  5:46 AM  Result Value Ref Range   WBC 16.3 (H) 4.0 - 10.5 K/uL   RBC 2.59 (L) 4.22 - 5.81 MIL/uL   Hemoglobin 7.0 (L) 13.0 - 17.0 g/dL   HCT 11.9 (L) 14.7 - 82.9 %   MCV 90.3 80.0 - 100.0 fL   MCH 27.0 26.0 - 34.0 pg   MCHC 29.9 (L) 30.0 - 36.0 g/dL   RDW 56.2 (H) 13.0 - 86.5 %   Platelets 258 150 - 400 K/uL   nRBC 0.0 0.0 - 0.2 %    Comment: Performed at [facility] Lab, [address], [place], [place] [address]  Magnesium     Status: Abnormal   Collection Time: 02/09/23  5:46 AM  Result Value Ref Range   Magnesium 1.2 (L) 1.7 - 2.4 mg/dL    Comment: Performed at [facility], [address], [place], [place] [address]  Glucose, capillary     Status: None   Collection Time: 02/09/23  6:15 AM  Result Value Ref Range   Glucose-Capillary 98 70 - 99 mg/dL    Comment: Glucose reference range applies only to samples taken after fasting for at least 8 hours.    Imaging / Studies: CT Head Wo Contrast  Result Date: 02/08/2023 CLINICAL DATA:  Delirium. EXAM: CT HEAD WITHOUT CONTRAST TECHNIQUE: Contiguous axial images were obtained from the base of the skull through the vertex without intravenous contrast. RADIATION DOSE REDUCTION: This exam was performed according to the departmental dose-optimization program which includes automated exposure control, adjustment of the mA and/or kV according to patient size and/or use of iterative reconstruction technique. COMPARISON:  Head CT  12/05/2022 and MRI 09/19/2022 FINDINGS: Brain: There has been interval evolution of the large right PCA infarct which is now chronic in appearance. Hyperdense foci within the area of right occipital encephalomalacia are attributed to mineralization. No definite acute intracranial hemorrhage, acute infarct, mass, midline shift, or extra-axial fluid collection is identified. There is mild cerebral atrophy. Hypodensities elsewhere in the  cerebral white matter bilaterally are similar to the prior CT and are nonspecific but compatible with mild chronic small vessel ischemic disease. Vascular: Calcified atherosclerosis at the skull base. No hyperdense vessel. Skull: No acute fracture or suspicious osseous lesion. Sinuses/Orbits: Small mucous retention cyst in the left maxillary sinus. No significant mastoid fluid. Unremarkable orbits. Other: Partially visualized postsurgical changes in the right pre maxillary soft tissues. IMPRESSION: 1. No evidence of acute intracranial abnormality. 2. Chronic right PCA infarct. 3. Mild chronic small vessel ischemic disease. Electronically Signed   By: Sebastian Ache M.D.   On: 02/08/2023 19:11   CT ABDOMEN PELVIS W CONTRAST  Result Date: 02/08/2023 CLINICAL DATA:  Abdominal pain. Nausea and vomiting. Two months postop from total colectomy with end ileostomy. EXAM: CT ABDOMEN AND PELVIS WITH CONTRAST TECHNIQUE: Multidetector CT imaging of the abdomen and pelvis was performed using the standard protocol following bolus administration of intravenous contrast. RADIATION DOSE REDUCTION: This exam was performed according to the departmental dose-optimization program which includes automated exposure control, adjustment of the mA and/or kV according to patient size and/or use of iterative reconstruction technique. CONTRAST:  60mL OMNIPAQUE IOHEXOL 350 MG/ML SOLN COMPARISON:  12/03/2022 FINDINGS: Lower Chest: Tiny left pleural effusion and mild bibasilar atelectasis. Hepatobiliary: No suspicious hepatic masses identified. Gallstones are seen, however there is no evidence of cholecystitis or biliary dilatation. Pancreas:  No mass or inflammatory changes. Spleen: Within normal limits in size and appearance. Adrenals/Urinary Tract: No suspicious masses identified. No evidence of ureteral calculi or hydronephrosis. Stomach/Bowel: Patient has undergone total colectomy and right abdominal ileostomy since prior study. Generalized  mesenteric edema is seen. No No evidence of bowel obstruction. Mild wall thickening is seen involving the [name], consistent with proctitis. A small left abdominal fluid collection is seen between the inferior margin of the spleen and anterior abdominal wall which measures 5.4 x 2.1 cm on image 37/3. This is suspicious for abscess. Vascular/Lymphatic: No pathologically enlarged lymph nodes. No acute vascular findings. Reproductive:  No mass or other significant abnormality. Other:  None. Musculoskeletal: No suspicious bone lesions identified. Left posterior 10th rib fracture is unchanged since previous study. 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 Mild wall thickening involving the [name], consistent with proctitis. 5.4 cm fluid collection between the inferior margin of the spleen and left anterior abdominal wall, suspicious for abscess. Cholelithiasis. No radiographic evidence of cholecystitis. Unchanged left posterior 10th rib fracture. Electronically Signed   By: Danae Orleans M.D.   On: 02/08/2023 14:33   DG Chest Portable 1 View  Result Date: 02/08/2023 CLINICAL DATA:  Nausea and vomiting EXAM: PORTABLE CHEST 1 VIEW COMPARISON:  X-ray 12/19/2022 and older FINDINGS: Underinflation. Enlarged cardiopericardial silhouette with tortuous and ectatic aorta. Bronchovascular crowding. The opacity seen in the left mid to lower lung could be related to level of inflation. No pneumothorax or effusion. The inferior costophrenic angles are clipped off the edge of the film. IMPRESSION: Underinflation with bronchovascular crowding. Slightly more opacity left mid to lower lung. This could be related to level of inflation but an infiltrates not excluded. Follow up x-ray may be useful  with improved inflation as clinically appropriate or short-term follow-up Electronically Signed   By: Karen Kays M.D.   On: 02/08/2023 12:48    Medications / Allergies: per  chart  Antibiotics: Anti-infectives (From admission, onward)    Start     Dose/Rate Route Frequency Ordered Stop   02/09/23 1000  cefTRIAXone (ROCEPHIN) 2 g in sodium chloride 0.9 % 100 mL IVPB  Status:  Discontinued        2 g 200 mL/hr over 30 Minutes Intravenous Every 24 hours 02/09/23 0749 02/09/23 0950   02/08/23 2200  Urelle (URELLE/URISED) 81 MG tablet 81 mg        1 tablet Oral 2 times daily 02/08/23 1913     02/08/23 1515  piperacillin-tazobactam (ZOSYN) IVPB 3.375 g        3.375 g 100 mL/hr over 30 Minutes Intravenous  Once 02/08/23 1506 02/08/23 1607         Note: Portions of this report may have been transcribed using voice recognition software. Every effort was made to ensure accuracy; however, inadvertent computerized transcription errors may be present.   Any transcriptional errors that result from this process are unintentional.    Ardeth Sportsman, MD, FACS, MASCRS Esophageal, Gastrointestinal & Colorectal Surgery Robotic and Minimally Invasive Surgery  [facility] [address], [address] [place], [place] [address] [phone] Fax [phone] Main  CONTACT INFORMATION: Weekday (9AM-5PM): Call [facility] main office at [phone] Weeknight (5PM-9AM) or Weekend/Holiday: Check EPIC "Web Links" tab & use "AMION" (password " [email or web address]") for General Surgery [facility] coverage  Please, DO NOT use SecureChat  (it is not reliable communication to reach operating surgeons & will lead to a delay in care).   Epic staff messaging available for outptient concerns needing 1-2 business day response.      02/09/2023  9:50 AM

## 2023-02-09 NOTE — Assessment & Plan Note (Addendum)
Fluid mass found on CTAP.  IR consulted, mass not amenable to drainage. Got Zosyn and 1L bolus NS in the ED and small . After decision was made not to take pt to the OR, pt was given diet and was tolerating PO. WBC 16.3 elevated, afebrile. Continuing to have pain oxy prns given x3 and dilaudid x1 (AM). Patient was most recently on a short course of Oxy 10 mg q4h on Tuesday. Not currently on chronic pain medications otherwise. Will discontinue dilaudid.  - WBC 16.3 elevated compared to prior 8.0 yesterday - Surgery: no need for antibiotics, and advance diet. If worsens contact IR  -Advance as tolerated, motivated to eat this morning - scheduled tylenol q6hrs - home pregabalin 100 mg BID daily - oxycodone 5mg  q4hr PRN for moderate pain x3 last 24 hours  - Hgb 7.0, patient asymptomatic, suspect possible dilutional component, if drops below 7 will transfuse  -serial abdominal exams -f/u blood cultures  Bcx arm: prelim: NG   Bcx hand: gram positive cocci   Bcx ID panel: Staph species, pharmacy suspects contaminant

## 2023-02-11 ENCOUNTER — Other Ambulatory Visit (HOSPITAL_COMMUNITY): Payer: Self-pay

## 2023-02-11 LAB — CULTURE, BLOOD (ROUTINE X 2)

## 2023-02-13 LAB — CULTURE, BLOOD (ROUTINE X 2): Culture: NO GROWTH

## 2023-04-03 ENCOUNTER — Ambulatory Visit (HOSPITAL_BASED_OUTPATIENT_CLINIC_OR_DEPARTMENT_OTHER): Payer: Medicaid Other | Admitting: General Surgery

## 2023-06-27 ENCOUNTER — Inpatient Hospital Stay (HOSPITAL_COMMUNITY): Payer: Medicaid Other

## 2023-06-27 ENCOUNTER — Other Ambulatory Visit: Payer: Self-pay

## 2023-06-27 ENCOUNTER — Emergency Department (HOSPITAL_COMMUNITY): Payer: Medicaid Other

## 2023-06-27 ENCOUNTER — Encounter (HOSPITAL_COMMUNITY): Payer: Medicaid Other

## 2023-06-27 ENCOUNTER — Inpatient Hospital Stay (HOSPITAL_COMMUNITY)
Admission: EM | Admit: 2023-06-27 | Discharge: 2023-07-11 | DRG: 870 | Disposition: A | Discharge status: Skilled Nursing Facility | Payer: Medicaid Other | Source: Skilled Nursing Facility | Attending: Internal Medicine | Admitting: Internal Medicine

## 2023-06-27 DIAGNOSIS — I63531 Cerebral infarction due to unspecified occlusion or stenosis of right posterior cerebral artery: Secondary | ICD-10-CM | POA: Diagnosis not present

## 2023-06-27 DIAGNOSIS — E875 Hyperkalemia: Secondary | ICD-10-CM | POA: Diagnosis not present

## 2023-06-27 DIAGNOSIS — N390 Urinary tract infection, site not specified: Secondary | ICD-10-CM | POA: Diagnosis present

## 2023-06-27 DIAGNOSIS — A419 Sepsis, unspecified organism: Principal | ICD-10-CM | POA: Diagnosis present

## 2023-06-27 DIAGNOSIS — I130 Hypertensive heart and chronic kidney disease with heart failure and stage 1 through stage 4 chronic kidney disease, or unspecified chronic kidney disease: Secondary | ICD-10-CM | POA: Diagnosis present

## 2023-06-27 DIAGNOSIS — M549 Dorsalgia, unspecified: Secondary | ICD-10-CM | POA: Diagnosis present

## 2023-06-27 DIAGNOSIS — F32A Depression, unspecified: Secondary | ICD-10-CM | POA: Diagnosis present

## 2023-06-27 DIAGNOSIS — B9620 Unspecified Escherichia coli [E. coli] as the cause of diseases classified elsewhere: Secondary | ICD-10-CM | POA: Diagnosis present

## 2023-06-27 DIAGNOSIS — K219 Gastro-esophageal reflux disease without esophagitis: Secondary | ICD-10-CM | POA: Diagnosis present

## 2023-06-27 DIAGNOSIS — Z1612 Extended spectrum beta lactamase (ESBL) resistance: Secondary | ICD-10-CM | POA: Diagnosis present

## 2023-06-27 DIAGNOSIS — M51369 Other intervertebral disc degeneration, lumbar region without mention of lumbar back pain or lower extremity pain: Secondary | ICD-10-CM | POA: Diagnosis present

## 2023-06-27 DIAGNOSIS — G928 Other toxic encephalopathy: Secondary | ICD-10-CM | POA: Diagnosis present

## 2023-06-27 DIAGNOSIS — G9340 Encephalopathy, unspecified: Secondary | ICD-10-CM | POA: Diagnosis not present

## 2023-06-27 DIAGNOSIS — J440 Chronic obstructive pulmonary disease with acute lower respiratory infection: Secondary | ICD-10-CM | POA: Diagnosis present

## 2023-06-27 DIAGNOSIS — Z8249 Family history of ischemic heart disease and other diseases of the circulatory system: Secondary | ICD-10-CM

## 2023-06-27 DIAGNOSIS — Z993 Dependence on wheelchair: Secondary | ICD-10-CM

## 2023-06-27 DIAGNOSIS — Z8616 Personal history of COVID-19: Secondary | ICD-10-CM | POA: Diagnosis not present

## 2023-06-27 DIAGNOSIS — M86171 Other acute osteomyelitis, right ankle and foot: Secondary | ICD-10-CM | POA: Diagnosis present

## 2023-06-27 DIAGNOSIS — I639 Cerebral infarction, unspecified: Secondary | ICD-10-CM | POA: Diagnosis not present

## 2023-06-27 DIAGNOSIS — I2489 Other forms of acute ischemic heart disease: Secondary | ICD-10-CM | POA: Diagnosis present

## 2023-06-27 DIAGNOSIS — Z89611 Acquired absence of right leg above knee: Secondary | ICD-10-CM

## 2023-06-27 DIAGNOSIS — I6621 Occlusion and stenosis of right posterior cerebral artery: Secondary | ICD-10-CM | POA: Diagnosis not present

## 2023-06-27 DIAGNOSIS — Z9049 Acquired absence of other specified parts of digestive tract: Secondary | ICD-10-CM

## 2023-06-27 DIAGNOSIS — J189 Pneumonia, unspecified organism: Secondary | ICD-10-CM | POA: Diagnosis not present

## 2023-06-27 DIAGNOSIS — N319 Neuromuscular dysfunction of bladder, unspecified: Secondary | ICD-10-CM | POA: Diagnosis present

## 2023-06-27 DIAGNOSIS — R509 Fever, unspecified: Secondary | ICD-10-CM

## 2023-06-27 DIAGNOSIS — G894 Chronic pain syndrome: Secondary | ICD-10-CM | POA: Diagnosis present

## 2023-06-27 DIAGNOSIS — J1289 Other viral pneumonia: Secondary | ICD-10-CM | POA: Diagnosis present

## 2023-06-27 DIAGNOSIS — I63431 Cerebral infarction due to embolism of right posterior cerebral artery: Secondary | ICD-10-CM | POA: Diagnosis not present

## 2023-06-27 DIAGNOSIS — N400 Enlarged prostate without lower urinary tract symptoms: Secondary | ICD-10-CM | POA: Diagnosis present

## 2023-06-27 DIAGNOSIS — E1169 Type 2 diabetes mellitus with other specified complication: Secondary | ICD-10-CM | POA: Diagnosis present

## 2023-06-27 DIAGNOSIS — I5042 Chronic combined systolic (congestive) and diastolic (congestive) heart failure: Secondary | ICD-10-CM | POA: Diagnosis present

## 2023-06-27 DIAGNOSIS — Z91040 Latex allergy status: Secondary | ICD-10-CM

## 2023-06-27 DIAGNOSIS — Z79899 Other long term (current) drug therapy: Secondary | ICD-10-CM

## 2023-06-27 DIAGNOSIS — Z789 Other specified health status: Secondary | ICD-10-CM | POA: Diagnosis not present

## 2023-06-27 DIAGNOSIS — J9690 Respiratory failure, unspecified, unspecified whether with hypoxia or hypercapnia: Secondary | ICD-10-CM | POA: Diagnosis present

## 2023-06-27 DIAGNOSIS — E1165 Type 2 diabetes mellitus with hyperglycemia: Secondary | ICD-10-CM | POA: Diagnosis present

## 2023-06-27 DIAGNOSIS — B9789 Other viral agents as the cause of diseases classified elsewhere: Secondary | ICD-10-CM | POA: Diagnosis present

## 2023-06-27 DIAGNOSIS — E870 Hyperosmolality and hypernatremia: Secondary | ICD-10-CM | POA: Diagnosis present

## 2023-06-27 DIAGNOSIS — K559 Vascular disorder of intestine, unspecified: Secondary | ICD-10-CM | POA: Diagnosis present

## 2023-06-27 DIAGNOSIS — N179 Acute kidney failure, unspecified: Secondary | ICD-10-CM | POA: Diagnosis present

## 2023-06-27 DIAGNOSIS — E781 Pure hyperglyceridemia: Secondary | ICD-10-CM | POA: Diagnosis present

## 2023-06-27 DIAGNOSIS — R4182 Altered mental status, unspecified: Secondary | ICD-10-CM | POA: Diagnosis not present

## 2023-06-27 DIAGNOSIS — J9601 Acute respiratory failure with hypoxia: Secondary | ICD-10-CM | POA: Diagnosis present

## 2023-06-27 DIAGNOSIS — Z7189 Other specified counseling: Secondary | ICD-10-CM

## 2023-06-27 DIAGNOSIS — Z515 Encounter for palliative care: Secondary | ICD-10-CM

## 2023-06-27 DIAGNOSIS — I6300 Cerebral infarction due to thrombosis of unspecified precerebral artery: Secondary | ICD-10-CM | POA: Diagnosis not present

## 2023-06-27 DIAGNOSIS — Z781 Physical restraint status: Secondary | ICD-10-CM

## 2023-06-27 DIAGNOSIS — Z89511 Acquired absence of right leg below knee: Secondary | ICD-10-CM

## 2023-06-27 DIAGNOSIS — Z933 Colostomy status: Secondary | ICD-10-CM

## 2023-06-27 DIAGNOSIS — D696 Thrombocytopenia, unspecified: Secondary | ICD-10-CM | POA: Diagnosis not present

## 2023-06-27 DIAGNOSIS — Z7982 Long term (current) use of aspirin: Secondary | ICD-10-CM

## 2023-06-27 DIAGNOSIS — Z1152 Encounter for screening for COVID-19: Secondary | ICD-10-CM

## 2023-06-27 DIAGNOSIS — H5462 Unqualified visual loss, left eye, normal vision right eye: Secondary | ICD-10-CM | POA: Diagnosis present

## 2023-06-27 DIAGNOSIS — Z85828 Personal history of other malignant neoplasm of skin: Secondary | ICD-10-CM

## 2023-06-27 DIAGNOSIS — I6389 Other cerebral infarction: Secondary | ICD-10-CM | POA: Diagnosis not present

## 2023-06-27 DIAGNOSIS — Z89412 Acquired absence of left great toe: Secondary | ICD-10-CM

## 2023-06-27 DIAGNOSIS — Z833 Family history of diabetes mellitus: Secondary | ICD-10-CM

## 2023-06-27 DIAGNOSIS — E119 Type 2 diabetes mellitus without complications: Secondary | ICD-10-CM | POA: Diagnosis not present

## 2023-06-27 DIAGNOSIS — E1122 Type 2 diabetes mellitus with diabetic chronic kidney disease: Secondary | ICD-10-CM | POA: Diagnosis present

## 2023-06-27 DIAGNOSIS — N1832 Chronic kidney disease, stage 3b: Secondary | ICD-10-CM | POA: Diagnosis present

## 2023-06-27 DIAGNOSIS — F05 Delirium due to known physiological condition: Secondary | ICD-10-CM | POA: Diagnosis not present

## 2023-06-27 DIAGNOSIS — E878 Other disorders of electrolyte and fluid balance, not elsewhere classified: Secondary | ICD-10-CM | POA: Diagnosis not present

## 2023-06-27 DIAGNOSIS — D649 Anemia, unspecified: Secondary | ICD-10-CM | POA: Diagnosis present

## 2023-06-27 DIAGNOSIS — E872 Acidosis, unspecified: Secondary | ICD-10-CM | POA: Diagnosis present

## 2023-06-27 DIAGNOSIS — Z85820 Personal history of malignant melanoma of skin: Secondary | ICD-10-CM

## 2023-06-27 DIAGNOSIS — F17210 Nicotine dependence, cigarettes, uncomplicated: Secondary | ICD-10-CM | POA: Diagnosis present

## 2023-06-27 DIAGNOSIS — F419 Anxiety disorder, unspecified: Secondary | ICD-10-CM | POA: Diagnosis present

## 2023-06-27 DIAGNOSIS — R569 Unspecified convulsions: Secondary | ICD-10-CM | POA: Diagnosis not present

## 2023-06-27 DIAGNOSIS — Z7985 Long-term (current) use of injectable non-insulin antidiabetic drugs: Secondary | ICD-10-CM

## 2023-06-27 DIAGNOSIS — Z794 Long term (current) use of insulin: Secondary | ICD-10-CM

## 2023-06-27 LAB — URINALYSIS, W/ REFLEX TO CULTURE (INFECTION SUSPECTED)
Bilirubin Urine: NEGATIVE
Glucose, UA: NEGATIVE mg/dL
Ketones, ur: NEGATIVE mg/dL
Nitrite: NEGATIVE
Protein, ur: 30 mg/dL — AB
Specific Gravity, Urine: 1.006 (ref 1.005–1.030)
pH: 6 (ref 5.0–8.0)

## 2023-06-27 LAB — I-STAT ARTERIAL BLOOD GAS, ED
Acid-base deficit: 3 mmol/L — ABNORMAL HIGH (ref 0.0–2.0)
Bicarbonate: 25 mmol/L (ref 20.0–28.0)
Calcium, Ion: 1.24 mmol/L (ref 1.15–1.40)
HCT: 37 % — ABNORMAL LOW (ref 39.0–52.0)
Hemoglobin: 12.6 g/dL — ABNORMAL LOW (ref 13.0–17.0)
O2 Saturation: 92 %
Patient temperature: 102.7
Potassium: 5.8 mmol/L — ABNORMAL HIGH (ref 3.5–5.1)
Sodium: 137 mmol/L (ref 135–145)
TCO2: 27 mmol/L (ref 22–32)
pCO2 arterial: 64.8 mm[Hg] — ABNORMAL HIGH (ref 32–48)
pH, Arterial: 7.206 — ABNORMAL LOW (ref 7.35–7.45)
pO2, Arterial: 88 mm[Hg] (ref 83–108)

## 2023-06-27 LAB — COMPREHENSIVE METABOLIC PANEL
ALT: 14 U/L (ref 0–44)
AST: 18 U/L (ref 15–41)
Albumin: 3.1 g/dL — ABNORMAL LOW (ref 3.5–5.0)
Alkaline Phosphatase: 107 U/L (ref 38–126)
Anion gap: 14 (ref 5–15)
BUN: 48 mg/dL — ABNORMAL HIGH (ref 8–23)
CO2: 21 mmol/L — ABNORMAL LOW (ref 22–32)
Calcium: 8.7 mg/dL — ABNORMAL LOW (ref 8.9–10.3)
Chloride: 103 mmol/L (ref 98–111)
Creatinine, Ser: 2.6 mg/dL — ABNORMAL HIGH (ref 0.61–1.24)
GFR, Estimated: 27 mL/min — ABNORMAL LOW (ref >60)
Glucose, Bld: 106 mg/dL — ABNORMAL HIGH (ref 70–99)
Potassium: 5.8 mmol/L — ABNORMAL HIGH (ref 3.5–5.1)
Sodium: 138 mmol/L (ref 135–145)
Total Bilirubin: 0.6 mg/dL (ref 0.0–1.2)
Total Protein: 7 g/dL (ref 6.5–8.1)

## 2023-06-27 LAB — HEMOGLOBIN A1C
Hgb A1c MFr Bld: 6.4 % — ABNORMAL HIGH (ref 4.8–5.6)
Mean Plasma Glucose: 136.98 mg/dL

## 2023-06-27 LAB — URINALYSIS, ROUTINE W REFLEX MICROSCOPIC

## 2023-06-27 LAB — RESPIRATORY PANEL BY PCR

## 2023-06-27 LAB — CBC WITH DIFFERENTIAL/PLATELET
Abs Immature Granulocytes: 0.16 10*3/uL — ABNORMAL HIGH (ref 0.00–0.07)
Basophils Absolute: 0.1 10*3/uL (ref 0.0–0.1)
Basophils Relative: 0 %
Eosinophils Absolute: 0.1 10*3/uL (ref 0.0–0.5)
Eosinophils Relative: 1 %
HCT: 38.1 % — ABNORMAL LOW (ref 39.0–52.0)
Hemoglobin: 11.7 g/dL — ABNORMAL LOW (ref 13.0–17.0)
Immature Granulocytes: 1 %
Lymphocytes Relative: 7 %
Lymphs Abs: 1 10*3/uL (ref 0.7–4.0)
MCH: 29.2 pg (ref 26.0–34.0)
MCHC: 30.7 g/dL (ref 30.0–36.0)
MCV: 95 fL (ref 80.0–100.0)
Monocytes Absolute: 0.7 10*3/uL (ref 0.1–1.0)
Monocytes Relative: 5 %
Neutro Abs: 11 10*3/uL — ABNORMAL HIGH (ref 1.7–7.7)
Neutrophils Relative %: 86 %
Platelets: 311 10*3/uL (ref 150–400)
RBC: 4.01 MIL/uL — ABNORMAL LOW (ref 4.22–5.81)
RDW: 15.1 % (ref 11.5–15.5)
WBC: 13 10*3/uL — ABNORMAL HIGH (ref 4.0–10.5)
nRBC: 0 % (ref 0.0–0.2)

## 2023-06-27 LAB — URINALYSIS, MICROSCOPIC (REFLEX): WBC, UA: >50 WBC/hpf (ref 0–5)

## 2023-06-27 LAB — TROPONIN I (HIGH SENSITIVITY)
Troponin I (High Sensitivity): 83 ng/L — ABNORMAL HIGH (ref <18)
Troponin I (High Sensitivity): 705 ng/L (ref <18)

## 2023-06-27 LAB — RAPID URINE DRUG SCREEN, HOSP PERFORMED
Amphetamines: NOT DETECTED
Barbiturates: NOT DETECTED
Benzodiazepines: NOT DETECTED
Cocaine: NOT DETECTED
Opiates: NOT DETECTED
Tetrahydrocannabinol: NOT DETECTED

## 2023-06-27 LAB — BASIC METABOLIC PANEL
Anion gap: 11 (ref 5–15)
BUN: 50 mg/dL — ABNORMAL HIGH (ref 8–23)
CO2: 21 mmol/L — ABNORMAL LOW (ref 22–32)
Calcium: 8.1 mg/dL — ABNORMAL LOW (ref 8.9–10.3)
Chloride: 104 mmol/L (ref 98–111)
Creatinine, Ser: 2.82 mg/dL — ABNORMAL HIGH (ref 0.61–1.24)
GFR, Estimated: 24 mL/min — ABNORMAL LOW (ref >60)
Glucose, Bld: 102 mg/dL — ABNORMAL HIGH (ref 70–99)
Potassium: 4.3 mmol/L (ref 3.5–5.1)
Sodium: 136 mmol/L (ref 135–145)

## 2023-06-27 LAB — RESP PANEL BY RT-PCR (RSV, FLU A&B, COVID)  RVPGX2
Influenza A by PCR: NEGATIVE
Influenza B by PCR: NEGATIVE
Resp Syncytial Virus by PCR: NEGATIVE
SARS Coronavirus 2 by RT PCR: NEGATIVE

## 2023-06-27 LAB — GLUCOSE, CAPILLARY
Glucose-Capillary: 63 mg/dL — ABNORMAL LOW (ref 70–99)
Glucose-Capillary: 76 mg/dL (ref 70–99)
Glucose-Capillary: 78 mg/dL (ref 70–99)
Glucose-Capillary: 88 mg/dL (ref 70–99)
Glucose-Capillary: 93 mg/dL (ref 70–99)

## 2023-06-27 LAB — PROTIME-INR
INR: 1.1 (ref 0.8–1.2)
Prothrombin Time: 14 s (ref 11.4–15.2)

## 2023-06-27 LAB — AMMONIA: Ammonia: 30 umol/L (ref 9–35)

## 2023-06-27 LAB — PROCALCITONIN: Procalcitonin: 1.65 ng/mL

## 2023-06-27 LAB — BRAIN NATRIURETIC PEPTIDE: B Natriuretic Peptide: 63.1 pg/mL (ref 0.0–100.0)

## 2023-06-27 LAB — C DIFFICILE QUICK SCREEN W PCR REFLEX
C Diff antigen: NEGATIVE
C Diff interpretation: NOT DETECTED
C Diff toxin: NEGATIVE

## 2023-06-27 LAB — APTT: aPTT: 32 s (ref 24–36)

## 2023-06-27 LAB — I-STAT CG4 LACTIC ACID, ED
Lactic Acid, Venous: 1.8 mmol/L (ref 0.5–1.9)
Lactic Acid, Venous: 0.9 mmol/L (ref 0.5–1.9)

## 2023-06-27 LAB — MRSA NEXT GEN BY PCR, NASAL: MRSA by PCR Next Gen: DETECTED — AB

## 2023-06-27 MED ORDER — FAMOTIDINE 20 MG PO TABS
20.0000 mg | ORAL_TABLET | Freq: Two times a day (BID) | ORAL | Status: DC
  Administered 2023-06-27: 20 mg
  Filled 2023-06-27: qty 1

## 2023-06-27 MED ORDER — FENTANYL 2500MCG IN NS 250ML (10MCG/ML) PREMIX INFUSION
0.0000 ug/h | INTRAVENOUS | Status: DC
Start: 2023-06-27 — End: 2023-07-06
  Administered 2023-06-27: 175 ug/h via INTRAVENOUS
  Administered 2023-06-28 – 2023-06-29 (×3): 200 ug/h via INTRAVENOUS
  Administered 2023-06-29: 225 ug/h via INTRAVENOUS
  Administered 2023-06-30: 375 ug/h via INTRAVENOUS
  Administered 2023-06-30: 300 ug/h via INTRAVENOUS
  Administered 2023-06-30: 375 ug/h via INTRAVENOUS
  Administered 2023-06-30: 400 ug/h via INTRAVENOUS
  Administered 2023-07-01: 150 ug/h via INTRAVENOUS
  Administered 2023-07-01: 375 ug/h via INTRAVENOUS
  Administered 2023-07-02 – 2023-07-03 (×3): 200 ug/h via INTRAVENOUS
  Administered 2023-07-04: 50 ug/h via INTRAVENOUS
  Administered 2023-07-06: 125 ug/h via INTRAVENOUS
  Filled 2023-06-27 (×17): qty 250

## 2023-06-27 MED ORDER — VANCOMYCIN HCL IN DEXTROSE 1-5 GM/200ML-% IV SOLN
1000.0000 mg | Freq: Once | INTRAVENOUS | Status: AC
  Administered 2023-06-27: 1000 mg via INTRAVENOUS
  Filled 2023-06-27: qty 200

## 2023-06-27 MED ORDER — SUCCINYLCHOLINE CHLORIDE 20 MG/ML IJ SOLN
INTRAMUSCULAR | Status: AC | PRN
  Administered 2023-06-27: 100 mg via INTRAVENOUS

## 2023-06-27 MED ORDER — SODIUM CHLORIDE 0.9 % IV SOLN
2.0000 g | Freq: Three times a day (TID) | INTRAVENOUS | Status: DC
  Administered 2023-06-27 – 2023-06-28 (×2): 2 g via INTRAVENOUS
  Filled 2023-06-27 (×3): qty 2000

## 2023-06-27 MED ORDER — SODIUM CHLORIDE 0.9 % IV SOLN: 3.0000 g | Freq: Once | INTRAVENOUS | Status: DC

## 2023-06-27 MED ORDER — NALOXONE HCL 2 MG/2ML IJ SOSY
1.0000 mg | PREFILLED_SYRINGE | Freq: Once | INTRAMUSCULAR | Status: AC
  Administered 2023-06-27: 1 mg via INTRAVENOUS

## 2023-06-27 MED ORDER — PIPERACILLIN-TAZOBACTAM 3.375 G IVPB 30 MIN
3.3750 g | Freq: Once | INTRAVENOUS | Status: AC
  Administered 2023-06-27: 3.375 g via INTRAVENOUS
  Filled 2023-06-27: qty 50

## 2023-06-27 MED ORDER — POLYETHYLENE GLYCOL 3350 17 G PO PACK: 17.0000 g | PACK | Freq: Every day | ORAL | Status: DC | PRN

## 2023-06-27 MED ORDER — HEPARIN SODIUM (PORCINE) 5000 UNIT/ML IJ SOLN
5000.0000 [IU] | Freq: Three times a day (TID) | INTRAMUSCULAR | Status: DC
  Administered 2023-06-27 – 2023-07-11 (×42): 5000 [IU] via SUBCUTANEOUS
  Filled 2023-06-27 (×44): qty 1

## 2023-06-27 MED ORDER — SODIUM CHLORIDE 0.9 % IV SOLN: INTRAVENOUS | Status: AC | PRN

## 2023-06-27 MED ORDER — FUROSEMIDE 10 MG/ML IJ SOLN
40.0000 mg | Freq: Once | INTRAMUSCULAR | Status: AC
  Administered 2023-06-27: 40 mg via INTRAVENOUS
  Filled 2023-06-27: qty 4

## 2023-06-27 MED ORDER — PIPERACILLIN-TAZOBACTAM 3.375 G IVPB
3.3750 g | Freq: Three times a day (TID) | INTRAVENOUS | Status: DC
  Administered 2023-06-27: 3.375 g via INTRAVENOUS
  Filled 2023-06-27: qty 50

## 2023-06-27 MED ORDER — VANCOMYCIN VARIABLE DOSE PER UNSTABLE RENAL FUNCTION (PHARMACIST DOSING): Status: DC

## 2023-06-27 MED ORDER — FENTANYL 2500MCG IN NS 250ML (10MCG/ML) PREMIX INFUSION
INTRAVENOUS | Status: AC
  Administered 2023-06-27: 25 ug/h via INTRAVENOUS
  Filled 2023-06-27: qty 250

## 2023-06-27 MED ORDER — SODIUM CHLORIDE 0.9 % IV SOLN
500.0000 mg | INTRAVENOUS | Status: AC
  Administered 2023-06-27 – 2023-06-29 (×3): 500 mg via INTRAVENOUS
  Filled 2023-06-27 (×3): qty 5

## 2023-06-27 MED ORDER — SODIUM CHLORIDE 0.9 % IV SOLN
2.0000 g | Freq: Two times a day (BID) | INTRAVENOUS | Status: DC
  Administered 2023-06-27: 2 g via INTRAVENOUS
  Filled 2023-06-27: qty 20

## 2023-06-27 MED ORDER — NALOXONE HCL 2 MG/2ML IJ SOSY
PREFILLED_SYRINGE | INTRAMUSCULAR | Status: AC
  Filled 2023-06-27: qty 2

## 2023-06-27 MED ORDER — PROPOFOL 1000 MG/100ML IV EMUL
5.0000 ug/kg/min | INTRAVENOUS | Status: DC
  Administered 2023-06-27: 5 ug/kg/min via INTRAVENOUS
  Administered 2023-06-27: 10 ug/kg/min via INTRAVENOUS
  Administered 2023-06-27: 30 ug/kg/min via INTRAVENOUS
  Administered 2023-06-28: 20 ug/kg/min via INTRAVENOUS
  Administered 2023-06-28 – 2023-06-29 (×3): 30 ug/kg/min via INTRAVENOUS
  Filled 2023-06-27 (×8): qty 100

## 2023-06-27 MED ORDER — NALOXONE HCL 0.4 MG/ML IJ SOLN: 0.4000 mg | Freq: Once | INTRAMUSCULAR | Status: DC

## 2023-06-27 MED ORDER — DOCUSATE SODIUM 100 MG PO CAPS: 100.0000 mg | ORAL_CAPSULE | Freq: Two times a day (BID) | ORAL | Status: DC | PRN

## 2023-06-27 MED ORDER — DEXTROSE 50 % IV SOLN
INTRAVENOUS | Status: AC
  Administered 2023-06-27: 25 mL
  Filled 2023-06-27: qty 50

## 2023-06-27 MED ORDER — FENTANYL CITRATE PF 50 MCG/ML IJ SOSY
PREFILLED_SYRINGE | INTRAMUSCULAR | Status: AC
  Filled 2023-06-27: qty 4

## 2023-06-27 MED ORDER — ORAL CARE MOUTH RINSE
15.0000 mL | OROMUCOSAL | Status: DC
  Administered 2023-06-28 – 2023-07-07 (×106): 15 mL via OROMUCOSAL

## 2023-06-27 MED ORDER — FENTANYL CITRATE PF 50 MCG/ML IJ SOSY
25.0000 ug | PREFILLED_SYRINGE | INTRAMUSCULAR | Status: DC | PRN
  Administered 2023-06-27 – 2023-06-28 (×3): 100 ug via INTRAVENOUS
  Administered 2023-06-29: 50 ug via INTRAVENOUS
  Administered 2023-06-29 (×2): 100 ug via INTRAVENOUS
  Administered 2023-06-29 (×2): 50 ug via INTRAVENOUS
  Administered 2023-06-30: 100 ug via INTRAVENOUS

## 2023-06-27 MED ORDER — METRONIDAZOLE 500 MG/100ML IV SOLN
500.0000 mg | Freq: Two times a day (BID) | INTRAVENOUS | Status: DC
  Administered 2023-06-27: 500 mg via INTRAVENOUS
  Filled 2023-06-27: qty 100

## 2023-06-27 MED ORDER — PANTOPRAZOLE SODIUM 40 MG PO TBEC: 40.0000 mg | DELAYED_RELEASE_TABLET | Freq: Every day | ORAL | Status: DC

## 2023-06-27 MED ORDER — FENTANYL CITRATE PF 50 MCG/ML IJ SOSY
200.0000 ug | PREFILLED_SYRINGE | Freq: Once | INTRAMUSCULAR | Status: AC
  Administered 2023-06-27: 200 ug via INTRAVENOUS

## 2023-06-27 MED ORDER — DEXTROSE 5 % IV SOLN
800.0000 mg | INTRAVENOUS | Status: DC
  Administered 2023-06-27: 800 mg via INTRAVENOUS
  Filled 2023-06-27 (×2): qty 16

## 2023-06-27 MED ORDER — ORAL CARE MOUTH RINSE: 15.0000 mL | OROMUCOSAL | Status: DC | PRN

## 2023-06-27 MED ORDER — NOREPINEPHRINE 4 MG/250ML-% IV SOLN
0.0000 ug/min | INTRAVENOUS | Status: DC
  Administered 2023-06-27: 2 ug/min via INTRAVENOUS
  Administered 2023-06-28: 7 ug/min via INTRAVENOUS
  Administered 2023-06-28: 8 ug/min via INTRAVENOUS
  Administered 2023-06-28 – 2023-06-29 (×2): 12 ug/min via INTRAVENOUS
  Administered 2023-06-29: 8 ug/min via INTRAVENOUS
  Filled 2023-06-27 (×8): qty 250

## 2023-06-27 MED ORDER — CHLORHEXIDINE GLUCONATE CLOTH 2 % EX PADS
6.0000 | MEDICATED_PAD | Freq: Every day | CUTANEOUS | Status: DC
  Administered 2023-06-28 – 2023-06-30 (×4): 6 via TOPICAL

## 2023-06-27 MED ORDER — SODIUM CHLORIDE 0.9% FLUSH
10.0000 mL | Freq: Two times a day (BID) | INTRAVENOUS | Status: DC
  Administered 2023-06-28 (×2): 30 mL
  Administered 2023-06-28 – 2023-06-29 (×2): 10 mL
  Administered 2023-06-29: 30 mL
  Administered 2023-06-30: 10 mL
  Administered 2023-06-30: 40 mL
  Administered 2023-07-01 – 2023-07-10 (×18): 10 mL

## 2023-06-27 MED ORDER — INSULIN ASPART 100 UNIT/ML IJ SOLN
0.0000 [IU] | INTRAMUSCULAR | Status: DC
  Administered 2023-06-29 (×3): 1 [IU] via SUBCUTANEOUS
  Administered 2023-06-29: 2 [IU] via SUBCUTANEOUS
  Administered 2023-07-01 – 2023-07-02 (×5): 1 [IU] via SUBCUTANEOUS
  Administered 2023-07-02: 2 [IU] via SUBCUTANEOUS
  Administered 2023-07-02 – 2023-07-04 (×12): 1 [IU] via SUBCUTANEOUS
  Administered 2023-07-04 (×2): 2 [IU] via SUBCUTANEOUS
  Administered 2023-07-04 – 2023-07-05 (×2): 1 [IU] via SUBCUTANEOUS
  Administered 2023-07-05: 2 [IU] via SUBCUTANEOUS

## 2023-06-27 MED ORDER — ACETAMINOPHEN 325 MG PO TABS
650.0000 mg | ORAL_TABLET | Freq: Four times a day (QID) | ORAL | Status: DC | PRN
  Administered 2023-06-27 – 2023-07-07 (×6): 650 mg
  Filled 2023-06-27 (×6): qty 2

## 2023-06-27 MED ORDER — SODIUM CHLORIDE 0.9 % IV SOLN: INTRAVENOUS | Status: DC

## 2023-06-27 MED ORDER — FAMOTIDINE 20 MG PO TABS
20.0000 mg | ORAL_TABLET | Freq: Every day | ORAL | Status: DC
  Administered 2023-06-28 – 2023-06-30 (×3): 20 mg
  Filled 2023-06-27 (×3): qty 1

## 2023-06-27 MED ORDER — ETOMIDATE 2 MG/ML IV SOLN
INTRAVENOUS | Status: AC | PRN
  Administered 2023-06-27: 20 mg via INTRAVENOUS

## 2023-06-27 MED ORDER — DEXTROSE IN LACTATED RINGERS 5 % IV SOLN: INTRAVENOUS | Status: DC

## 2023-06-27 MED ORDER — SODIUM ZIRCONIUM CYCLOSILICATE 5 G PO PACK
5.0000 g | PACK | Freq: Two times a day (BID) | ORAL | Status: AC
  Administered 2023-06-27 (×2): 5 g via ORAL
  Filled 2023-06-27 (×2): qty 1

## 2023-06-27 NOTE — Progress Notes (Signed)
 Pt transported on vent from 2M06 to CT and then to MRI and back to 2M06 w/o complications.Marland Kitchen

## 2023-06-27 NOTE — ED Triage Notes (Signed)
 Bibgcems from [facility] due to pot being unresponsive. Staff state at 2 am he was alert. Upon ems arrival pt vomited and gargleing. Ems began bagging with npa in place.  Bp 106/56 92% w/bvm Hr 114 132 cbg

## 2023-06-27 NOTE — H&P (Addendum)
 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  Recent increase in liquid stool from ostomy.  Intubated in ER for airway protection.  Workup has revealed a little bit of elevated Cr, mild leukocytosis.  PCCM consulted for admission.  ETT secretions and urine appear markedly abnormal/infected.  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    Chronic pain syndrome    COPD (chronic obstructive pulmonary disease) (HCC)    DDD (degenerative disc disease), lumbar    Depression    major depressive disorder    Diabetes mellitus without complication (HCC)    type 2    Diabetic ulcer of toe of left foot (HCC) 10/02/2016   Difficult intubation    Difficulty in walking, not elsewhere classified    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   Foot drop, bilateral    Foot drop, right 12/27/2015   GERD (gastroesophageal reflux disease)    Herpesviral vesicular dermatitis    History of COVID-19 03/15/2022   Hyperlipidemia    Hyperlipidemia    Hypertension    Insomnia    Malignant melanoma of other parts of face (HCC)    Melanoma (HCC)    MRSA bacteremia    Muscle weakness  (generalized)    Nasal congestion    Necrosis of toe (HCC) 07/08/2018   Neuromuscular dysfunction of bladder    Osteomyelitis (HCC)    Osteomyelitis (HCC)    Osteomyelitis (HCC)    right BKA   Other idiopathic peripheral autonomic neuropathy    Retention of urine, unspecified    Squamous cell carcinoma of skin    Stroke (HCC)    Unsteadiness on feet    Urinary retention    Urinary retention    Urinary tract infection    Wears dentures    Wears glasses      Significant Hospital Events: Including procedures, antibiotic start and stop dates in addition to other pertinent events   1/9 admit  Interim History / Subjective:  consult  Objective   Blood pressure 91/60, pulse (!) 116, temperature (!) 104.9 F (40.5 C), temperature source Bladder, resp. rate 19, height 6' 0.01 (1.829 m), weight 84 kg, SpO2 (!) 89%.    Vent Mode: PRVC FiO2 (%):  [60 %-100 %] 100 % Set Rate:  [18 bmp-22 bmp] 22 bmp Vt Set:  [600 mL] 600 mL PEEP:  [5 cmH20-8 cmH20] 8 cmH20 Plateau Pressure:  [18 cmH20] 18 cmH20   Intake/Output Summary (Last 24 hours) at 06/27/2023 1609 Last data filed at 06/27/2023 1031 Gross per 24 hour  Intake 498.96 ml  Output 300 ml  Net 198.96 ml   Filed Weights   06/27/23 0631  Weight: 84 kg    Examination: General: chronically ill appearing HENT: ETT with thick purulent secretions Lungs: Rhonci bilaterally, passive on vent Cardiovascular: RRR, ext warm Abdomen: soft, ostomy with liquid brown stool Extremities: R AKA, L foot missing multiple digits; multiple areas of skin breakdown (see media tab) Neuro: pupils reactive, intubated GU: foley with green urine  Patient Lines/Drains/Airways Status     Active Line/Drains/Airways     Name Placement date Placement time Site Days   Peripheral IV 06/27/23 Anterior;Distal;Right;Upper Arm 06/27/23  0606  Arm  less than 1   CVC Triple Lumen 06/27/23 Right Internal jugular 06/27/23  1200  -- less than 1   Ileostomy RUQ  12/04/22  0001  RUQ  205   Urethral Catheter Whitney E Temperature probe 14 Fr. 06/27/23  0708  Temperature probe  less than 1   Airway 7.5 mm 06/27/23  0630  -- less than 1   Pressure Injury 09/05/22 Ankle Anterior;Left Stage 2 -  Partial thickness loss of dermis presenting as a shallow open injury with a red, pink wound bed without slough. full thickness wound, NOT a pressure injury 09/05/22  2250  -- 295   Pressure Injury 12/04/22 Buttocks Posterior;Proximal;Right Stage 2 -  Partial thickness loss of dermis presenting as a shallow open injury with a red, pink wound bed without slough. 12/04/22  0410  -- 205   Pressure Injury 12/04/22 Thigh Left;Posterior Stage 3 -  Full thickness tissue loss. Subcutaneous fat may be visible but bone, tendon or muscle are NOT exposed. 12/04/22  0410  -- 205   Wound / Incision (Open or Dehisced) 12/04/22 Non-pressure wound Anterior;Left 12/04/22  0410  --  205   Wound / Incision (Open or Dehisced) 12/15/22 Pretibial Left;Proximal 12/15/22  0800  Pretibial  194           C diff neg  Resolved Hospital Problem list   N/A  Assessment & Plan:  Acute encephalopathy Severe R sided PNA Septic state, febrile illness Abnormal CT head r/o stroke r/o meningitis- hx HSV dermatitis Febrile illness Hx of abdominal abscess after ischemic colitis and diverting ileostomy DM2 Diastolic heart failure Morbid obesity Prior stroke HTN, HLD COPD CKD w/ hyperkalemia Increased ostomy output- C diff neg  - Vent bundle - f/u culture data - Meningitis coverage + flagyl  - MRI brain, EEG, CT C/A/P - Neuro consult - LP if we have time - Levo for MAP 65 - WOC in AM - RVP, Pct - Home meds on hold - Sedation as ordered  Best Practice (right click and Reselect all SmartList Selections daily)   Diet/type: NPO DVT prophylaxis prophylactic heparin   Pressure ulcer(s): present on admission  GI prophylaxis: H2B Lines: Central line Foley:  Yes, and it is still  needed Code Status:  full code Last date of multidisciplinary goals of care discussion [pending, updated by ER nurse on admission]  Labs   CBC: Recent Labs  Lab 06/27/23 0558 06/27/23 0838  WBC 13.0*  --   NEUTROABS 11.0*  --   HGB 11.7* 12.6*  HCT 38.1* 37.0*  MCV 95.0  --   PLT 311  --     Basic Metabolic Panel: Recent Labs  Lab 06/27/23 0558 06/27/23 0838 06/27/23 1352  NA 138 137 136  K 5.8* 5.8* 4.3  CL 103  --  104  CO2 21*  --  21*  GLUCOSE 106*  --  102*  BUN 48*  --  50*  CREATININE 2.60*  --  2.82*  CALCIUM  8.7*  --  8.1*   GFR: Estimated Creatinine Clearance: 29 mL/min (A) (by C-G formula based on SCr of 2.82 mg/dL (H)). Recent Labs  Lab 06/27/23 0558 06/27/23 0604 06/27/23 0813 06/27/23 0824  PROCALCITON  --   --  1.65  --   WBC 13.0*  --   --   --   LATICACIDVEN  --  1.8  --  0.9    Liver Function Tests: Recent Labs  Lab 06/27/23 0558  AST 18  ALT 14  ALKPHOS 107  BILITOT 0.6  PROT 7.0  ALBUMIN  3.1*   No results for input(s): LIPASE, AMYLASE in the last 168 hours. Recent Labs  Lab 06/27/23 0925  AMMONIA 30    ABG    Component Value Date/Time   PHART 7.206 (L) 06/27/2023 0838   PCO2ART 64.8 (H) 06/27/2023 0838   PO2ART 88 06/27/2023 0838   HCO3 25.0 06/27/2023 0838   TCO2 27 06/27/2023 0838   ACIDBASEDEF 3.0 (H) 06/27/2023 0838   O2SAT 92 06/27/2023 0838     Coagulation Profile: Recent Labs  Lab 06/27/23 0558  INR 1.1    Cardiac Enzymes: No results for input(s): CKTOTAL, CKMB, CKMBINDEX, TROPONINI in the last 168 hours.  HbA1C: Hemoglobin A1C  Date/Time Value Ref Range Status  08/14/2016 12:00 AM 5.6  Final  01/18/2016 12:00 AM 5.5  Final   Hgb A1c MFr Bld  Date/Time Value Ref Range Status  06/27/2023 09:25 AM 6.4 (H) 4.8 - 5.6 % Final    Comment:    (NOTE) Pre diabetes:          5.7%-6.4%  Diabetes:              >6.4%  Glycemic control for   <7.0% adults with diabetes   12/03/2022 04:54 PM  6.9 (H) 4.8 - 5.6 % Final    Comment:    (NOTE) Pre diabetes:          5.7%-6.4%  Diabetes:              >6.4%  Glycemic control for   <7.0% adults with diabetes     CBG: Recent Labs  Lab 06/27/23 1104 06/27/23 1129 06/27/23 1516  GLUCAP 63* 88 78    Review of Systems:   Intubated/sedated  Past Medical History:  He,  has a past medical history of Acquired absence of left great toe (HCC), Acquired absence of right leg below knee (HCC), Acute osteomyelitis of calcaneum, right (HCC) (10/02/2016), Acute respiratory failure (HCC), Amputation stump infection (HCC) (08/12/2018), Anemia, Basal cell carcinoma, eyelid, Cancer (HCC), Candidiasis (08/12/2018), CHF (congestive heart failure) (HCC), Chronic back pain, Chronic kidney disease, Chronic multifocal osteomyelitis (HCC), Chronic pain syndrome, COPD (chronic obstructive pulmonary disease) (HCC), DDD (degenerative disc disease), lumbar, Depression, Diabetes mellitus without complication (HCC), Diabetic ulcer of toe of left foot (HCC) (10/02/2016), Difficult intubation, Difficulty in walking, not elsewhere classified, 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), Foot drop, bilateral, Foot drop, right (12/27/2015), GERD (gastroesophageal reflux disease), Herpesviral vesicular dermatitis, History of COVID-19 (03/15/2022), Hyperlipidemia, Hyperlipidemia, Hypertension, Insomnia, Malignant melanoma of other parts of face (HCC), Melanoma (HCC), MRSA bacteremia, Muscle weakness (generalized), Nasal congestion, Necrosis of toe (HCC) (07/08/2018), Neuromuscular dysfunction of bladder, Osteomyelitis (HCC), Osteomyelitis (HCC), Osteomyelitis (HCC), Other idiopathic peripheral autonomic neuropathy, Retention of urine, unspecified, Squamous cell carcinoma of skin, Stroke (HCC), Unsteadiness on feet, Urinary retention, Urinary retention,  Urinary tract infection, Wears dentures, and Wears glasses.   Surgical History:   Past Surgical History:  Procedure Laterality Date    AMPUTATION Left 03/29/2017   Procedure: LEFT GREAT TOE AMPUTATION AT METATARSOPHALANGEAL JOINT;  Surgeon: Harden Jerona GAILS, MD;  Location[facility] OR;  Service: Orthopedics;  Laterality: Left;   AMPUTATION Right 03/29/2017   Procedure: RIGHT BELOW KNEE AMPUTATION;  Surgeon: Harden Jerona GAILS, MD;  Location: [facility] OR;  Service: Orthopedics;  Laterality: Right;   AMPUTATION Left 06/06/2018   Procedure: LEFT 2ND TOE AMPUTATION;  Surgeon: Harden Jerona GAILS, MD;  Location: [facility] OR;  Service: Orthopedics;  Laterality: Left;   AMPUTATION Right 11/19/2018   Procedure: AMPUTATION ABOVE KNEE;  Surgeon: Harden Jerona GAILS, MD;  Location: [facility] OR;  Service: Orthopedics;  Laterality: Right;   ANTERIOR CERVICAL CORPECTOMY N/A 11/25/2015   Procedure: ANTERIOR CERVICAL FIVE CORPECTOMY Cervical four - six fusion;  Surgeon: Gerldine Maizes, MD;  Location: [facility];  Service: Neurosurgery;  Laterality: N/A;  ANTERIOR CERVICAL FIVE CORPECTOMY Cervical four - six fusion   APPLICATION OF WOUND VAC Right 10/22/2018   Procedure: Application Of Wound Vac;  Surgeon: Harden Jerona GAILS, MD;  Location: [facility] OR;  Service: Orthopedics;  Laterality: Right;   CYSTOSCOPY WITH BIOPSY N/A 05/01/2022   Procedure: CYSTOSCOPY WITH URETHRAL BIOPSY;  Surgeon: Elisabeth Valli BIRCH, MD;  Location: [facility] ORS;  Service: Urology;  Laterality: N/A;  1 HR   CYSTOSCOPY WITH RETROGRADE URETHROGRAM N/A 04/25/2018   Procedure: CYSTOSCOPY WITH RETROGRADE URETHROGRAM/ BALLOON DILATION;  Surgeon: Ottelin, Mark, MD;  Location: [facility] ORS;  Service: Urology;  Laterality: N/A;   CYSTOSCOPY WITH URETHRAL DILATATION N/A 05/01/2022   Procedure: BALLOON DILATION WITH  OPTILUME;  Surgeon: Elisabeth Valli BIRCH, MD;  Location: [facility] ORS;  Service: Urology;  Laterality: N/A;   LAPAROTOMY N/A 12/03/2022   Procedure: EXPLORATORY LAPAROTOMY  total abdominal colectomy with end ileostomy;  Surgeon: Kinsinger, Herlene Righter, MD;  Location: [facility] OR;  Service: General;  Laterality: N/A;   MULTIPLE TOOTH EXTRACTIONS      POSTERIOR CERVICAL FUSION/FORAMINOTOMY N/A 11/29/2015   Procedure: Cervical Three-Cervical Seven Posterior Cervical Laminectomy with Fusion;  Surgeon: Gerldine Maizes, MD;  Location: [facility];  Service: Neurosurgery;  Laterality: N/A;  Cervical Three-Cervical Seven Posterior Cervical Laminectomy with Fusion   STUMP REVISION Right 10/22/2018   Procedure: REVISION RIGHT BELOW KNEE AMPUTATION;  Surgeon: Harden Jerona GAILS, MD;  Location: [facility] OR;  Service: Orthopedics;  Laterality: Right;   STUMP REVISION Right 12/05/2018   Procedure: Revision Right Above Knee Amputation;  Surgeon: Harden Jerona GAILS, MD;  Location: [facility] OR;  Service: Orthopedics;  Laterality: Right;   STUMP REVISION Right 05/29/2019   Procedure: REVISION RIGHT ABOVE KNEE AMPUTATION;  Surgeon: Harden Jerona GAILS, MD;  Location: [facility] OR;  Service: Orthopedics;  Laterality: Right;   STUMP REVISION Right 06/24/2019   Procedure: STUMP REVISION;  Surgeon: Harden Jerona GAILS, MD;  Location: [facility] OR;  Service: Orthopedics;  Laterality: Right;     Social History:   reports that he has been smoking cigarettes. He has never used smokeless tobacco. He reports that he does not drink alcohol  and does not use drugs.   Family History:  His family history includes Bone cancer in his father; Cancer in his father, mother, paternal grandfather, and another family member; Diabetes in his mother and another family member; Heart disease in his mother; Prostate cancer in his father.   Allergies Allergies  Allergen Reactions   Latex Other (See Comments)  ALLERGIC, per [facility]     Home Medications  Prior to Admission medications   Medication Sig Start Date End Date Taking? Authorizing Provider  acetaminophen  (TYLENOL ) 500 MG tablet Take 1,000 mg by mouth in the morning and at bedtime.   Yes [provider]  aspirin  EC 81 MG tablet Take 81 mg by mouth daily. Swallow whole.   Yes [provider]  atorvastatin  (LIPITOR ) 80 MG tablet Take 1 tablet (80 mg  total) by mouth daily. Patient taking differently: Take 80 mg by mouth at bedtime. 09/22/22 01/27/24 Yes Cindy Garnette POUR, MD  carvedilol  (COREG ) 12.5 MG tablet Take 1 tablet (12.5 mg total) by mouth 2 (two) times daily with a meal. 12/25/22  Yes Ghimire, Renato, MD  Cholecalciferol  25 MCG (1000 UT) capsule Take 2,000 Units by mouth daily.   Yes [provider]  Cranberry 450 MG CAPS Take 450 mg by mouth daily.   Yes [provider]  cyclobenzaprine  (FLEXERIL ) 5 MG tablet Take 5 mg by mouth every 8 (eight) hours as needed for muscle spasms.   Yes [provider]  Dextran 70-Hypromellose (NATURAL BALANCE TEARS) 0.1-0.3 % SOLN Place 1 drop into both eyes every 4 (four) hours as needed (dry eyes).   Yes [provider]  escitalopram  (LEXAPRO ) 20 MG tablet Take 20 mg by mouth daily.   Yes [provider]  ferrous sulfate  325 (65 FE) MG tablet Take 1 tablet (325 mg total) by mouth 3 (three) times daily with meals. Patient taking differently: Take 325 mg by mouth daily. With meal. 01/23/23  Yes Bernard Drivers, MD  fluticasone  (FLONASE ) 50 MCG/ACT nasal spray Place 1 spray into both nostrils daily.   Yes [provider]  furosemide  (LASIX ) 20 MG tablet Take 20-40 mg by mouth See admin instructions. Take 40 mg by mouth in the morning and 20 mg by mouth in the afternoon   Yes [provider]  haloperidol  lactate (HALDOL ) 5 MG/ML injection Inject 1 mg into the muscle every 6 (six) hours as needed (agitation/psychosis).   Yes [provider]  hydrALAZINE  (APRESOLINE ) 10 MG tablet Take 1 tablet (10 mg total) by mouth every 8 (eight) hours. 12/25/22  Yes Raenelle Renato, MD  hydrOXYzine  (VISTARIL ) 50 MG capsule Take 50 mg by mouth at bedtime.   Yes [provider]  insulin  glargine (LANTUS  SOLOSTAR) 100 UNIT/ML Solostar Pen Inject 15 Units into the skin daily. Patient taking differently: Inject 15 Units into the skin at bedtime. 09/07/22  Yes Rai,  Ripudeep K, MD  insulin  lispro (HUMALOG ) 100 UNIT/ML KwikPen Inject 0-10 Units into the skin See admin instructions. Inject 0-10 units into the skin three times a day and at bedtime, PER SLIDING SCALE:  BGL 151-200 = 2 units 201-250 = 4 units 251-300 = 6 units 301-350 =  8 units 351-400 = 10 units Patient taking differently: Inject 0-10 Units into the skin See admin instructions. Inject 0-10 units into the skin before meals and at bedtime, PER SLIDING SCALE:  0-150 = 0 units 151-200 = 2 units 201-250 = 4 units 251-300 = 6 units 301-350 =  8 units 351-400 = 10 units 01/31/22  Yes Dahal, Chapman, MD  ipratropium-albuterol  (DUONEB) 0.5-2.5 (3) MG/3ML SOLN Take 3 mLs by nebulization every 6 (six) hours as needed (for shortness of breath).   Yes [provider]  LACTOBACILLUS PO Take 1 capsule by mouth in the morning and at bedtime.   Yes [provider]  lidocaine  (LIDODERM )  5 % Place 2 patches onto the skin daily. Remove & Discard patch within 12 hours or as directed by MD Patient taking differently: Place 1 patch onto the skin in the morning and at bedtime. Take off after 12 hours 12/25/22  Yes Raenelle Coria, MD  loperamide  (IMODIUM ) 2 MG capsule Take 1 capsule (2 mg total) by mouth 2 (two) times daily. 12/25/22  Yes Raenelle Coria, MD  loratadine  (CLARITIN ) 10 MG tablet Take 10 mg by mouth in the morning.   Yes [provider]  magnesium  oxide (MAG-OX) 400 (240 Mg) MG tablet Take 400 mg by mouth in the morning, at noon, and at bedtime.   Yes [provider]  Melatonin 10 MG TABS Take 10 mg by mouth at bedtime.   Yes [provider]  Meth-Hyo-M Bl-Na Phos-Ph Sal (URO-MP) 118 MG CAPS Take 118 mg by mouth 2 (two) times daily.   Yes [provider]  Multiple Vitamin (MULTIVITAMIN WITH MINERALS) TABS tablet Take 1 tablet by mouth daily. 12/25/22  Yes Raenelle Coria, MD  naloxone  (NARCAN ) 4 MG/0.1ML LIQD nasal spray kit Place 0.4 mg into the nose as  needed (opiate OD).   Yes [provider]  omeprazole (PRILOSEC) 20 MG capsule Take 20 mg by mouth at bedtime.   Yes [provider]  ondansetron  (ZOFRAN ) 4 MG tablet Take 4 mg by mouth every 8 (eight) hours as needed for nausea or vomiting.   Yes [provider]  oxybutynin  (DITROPAN -XL) 5 MG 24 hr tablet Take 5 mg by mouth in the morning and at bedtime.   Yes [provider]  oxyCODONE  (OXY IR/ROXICODONE ) 5 MG immediate release tablet Take 5 mg by mouth every 6 (six) hours as needed for moderate pain (pain score 4-6).   Yes [provider]  oxyCODONE  (OXYCONTIN ) 15 mg 12 hr tablet Take 15 mg by mouth in the morning, at noon, and at bedtime.   Yes [provider]  OXYGEN Inhale 2 L into the lungs as needed (to maintain sats above 90/SOB).   Yes [provider]  oxymetazoline  (AFRIN) 0.05 % nasal spray Place 1 spray into both nostrils 2 (two) times daily as needed for congestion.   Yes [provider]  pregabalin  (LYRICA ) 100 MG capsule Take 1 capsule (100 mg total) by mouth 2 (two) times daily. Patient taking differently: Take 100 mg by mouth 3 (three) times daily. 12/25/22 06/27/23 Yes Raenelle Coria, MD  pregabalin  (LYRICA ) 25 MG capsule Take 25 mg by mouth 3 (three) times daily.   Yes [provider]  QUEtiapine  (SEROQUEL ) 100 MG tablet Take 100 mg by mouth at bedtime.   Yes [provider]  senna (SENOKOT) 8.6 MG TABS tablet Take 2 tablets by mouth at bedtime.   Yes [provider]  sodium bicarbonate  650 MG tablet Take 650 mg by mouth 2 (two) times daily.   Yes [provider]  tamsulosin  (FLOMAX ) 0.4 MG CAPS capsule Take 2 capsules (0.8 mg total) by mouth daily. Patient taking differently: Take 0.4 mg by mouth daily. 02/09/23  Yes Lenard Calin, MD  tirzepatide [facility]) 2.5 MG/0.5ML Pen Inject 2.5 mg into the skin once a week. On Tuesdays   Yes [provider]  trolamine  salicylate (ASPERCREME) 10 % cream Apply 1 Application topically in the morning, at noon, in the evening, and at bedtime. Right leg   Yes [provider]  UNABLE TO FIND Take 30 mLs by mouth in the morning and  at bedtime. Med Name: liquid protein   Yes [provider]     Critical care time: 41 mins

## 2023-06-27 NOTE — Progress Notes (Signed)
 Resp culture collected and sent to lab lab notified. CCM MD notified, RT will monitor as needed.

## 2023-06-27 NOTE — Progress Notes (Signed)
 Pt ET tube taped by RT x 2 at bedside. ETT secures at 23 cm measured at lips, pt tolerated well, vitals stable through out, RN aware, RT will monitor as needed.     06/27/23 1132  Airway 7.5 mm  Placement Date/Time: 06/27/23 0630   Airway Device: Endotracheal Tube  ETT Types: Oral  Size (mm): 7.5 mm  Cuffed: Cuffed  Secured at (cm): 23 cm  Secured at (cm) (S)  23 cm  Measured From Lips  Secured Location Left  Secured By (S)  Cloth Tape  Bite Block No  Tube Holder Repositioned  (tube taped)  Prone position No  Cuff Pressure (cm H2O) Clear OR 27-39 CmH2O  Site Condition Dry

## 2023-06-27 NOTE — Consult Note (Signed)
 NEUROLOGY CONSULT NOTE   Date of service: June 27, 2023 Patient Name: Gregory [name] MRN:  [id number] DOB:  [birth year] Chief Complaint: altered mental status Requesting Provider: Claudene Toribio BROCKS, MD  History of Present Illness  [name] is a 63 y.o. 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 Per staff, he was seen around 0220 in his normal state. Patient was intubated in the ED for airway protection, had been vomiting en route. Unasyn was started for possible aspiration pneumonia.  CTH showed gray-white differentiation in right temporal area and neurology was consulted.   On exam, patient is intubated. Sedation was stopped for neurology exam. He opens eyes slightly to hs name. 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 He does have some roving eye movements. He does not follow commands. He withdraws in his upper extremities, but does not withdraw to his LLE (RLE is amputation). He is on a cooling blanket due to continued fevers up to 104, with shivering noted. He is tachycardic,  tachypneic with labored abdominal breathing.   RN and patient's daughter at bedside. Plan of care and assessment thoroughly discussed, all questions answered.    ROS   Unable to ascertain due to AMS, patient status.   Past History   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)    Acquired absence of right leg below knee (HCC)    Acute osteomyelitis of calcaneum, right (HCC) 10/02/2016   Acute respiratory failure (HCC)    Amputation  stump infection (HCC) 08/12/2018   Anemia    Basal cell carcinoma, eyelid    Cancer (HCC)    Candidiasis 08/12/2018   CHF (congestive heart failure) (HCC)    chronic diastolic    Chronic back pain    Chronic kidney disease    stage 3    Chronic multifocal osteomyelitis (HCC)    of ankle and foot    Chronic pain syndrome    COPD (chronic obstructive pulmonary disease) (HCC)    DDD (degenerative disc disease), lumbar    Depression    major depressive disorder    Diabetes mellitus without complication (HCC)    type 2    Diabetic ulcer of toe of left foot (HCC) 10/02/2016   Difficult intubation    Difficulty in walking, not elsewhere classified    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   Foot drop, bilateral    Foot drop, right 12/27/2015   GERD (gastroesophageal reflux disease)    Herpesviral vesicular dermatitis    History of COVID-19 03/15/2022   Hyperlipidemia    Hyperlipidemia    Hypertension    Insomnia    Malignant melanoma of other parts of face (HCC)    Melanoma (HCC)    MRSA bacteremia    Muscle weakness (generalized)    Nasal congestion    Necrosis of toe (HCC) 07/08/2018   Neuromuscular dysfunction of bladder    Osteomyelitis (HCC)    Osteomyelitis (HCC)    Osteomyelitis (HCC)    right BKA   Other idiopathic peripheral autonomic neuropathy    Retention of urine, unspecified    Squamous  cell carcinoma of skin    Stroke (HCC)    Unsteadiness on feet    Urinary retention    Urinary retention    Urinary tract infection    Wears dentures    Wears glasses     Past Surgical History:  Procedure Laterality Date   AMPUTATION Left 03/29/2017   Procedure: LEFT GREAT TOE AMPUTATION AT METATARSOPHALANGEAL JOINT;  Surgeon: Harden Jerona GAILS, MD;  Location: [facility] OR;  Service: Orthopedics;  Laterality: Left;   AMPUTATION Right 03/29/2017   Procedure: RIGHT BELOW KNEE AMPUTATION;  Surgeon: Harden Jerona GAILS, MD;  Location: [facility] OR;  Service: Orthopedics;  Laterality: Right;   AMPUTATION  Left 06/06/2018   Procedure: LEFT 2ND TOE AMPUTATION;  Surgeon: Harden Jerona GAILS, MD;  Location: [facility] OR;  Service: Orthopedics;  Laterality: Left;   AMPUTATION Right 11/19/2018   Procedure: AMPUTATION ABOVE KNEE;  Surgeon: Harden Jerona GAILS, MD;  Location: [facility] OR;  Service: Orthopedics;  Laterality: Right;   ANTERIOR CERVICAL CORPECTOMY N/A 11/25/2015   Procedure: ANTERIOR CERVICAL FIVE CORPECTOMY Cervical four - six fusion;  Surgeon: Gerldine Maizes, MD;  Location: [facility] OR[facility];  Service: Neurosurgery;  Laterality: N/A;  ANTERIOR CERVICAL FIVE CORPECTOMY Cervical four - six fusion   APPLICATION OF WOUND VAC Right 10/22/2018   Procedure: Application Of Wound Vac;  Surgeon: Harden Jerona GAILS, MD;  Location: [facility] OR;  Service: Orthopedics;  Laterality: Right;   CYSTOSCOPY WITH BIOPSY N/A 05/01/2022   Procedure: CYSTOSCOPY WITH URETHRAL BIOPSY;  Surgeon: Elisabeth Valli BIRCH, MD;  Location: [facility] ORS;  Service: Urology;  Laterality: N/A;  1 HR   CYSTOSCOPY WITH RETROGRADE URETHROGRAM N/A 04/25/2018   Procedure: CYSTOSCOPY WITH RETROGRADE URETHROGRAM/ BALLOON DILATION;  Surgeon: Ottelin, Mark, MD;  Location: [facility] ORS;  Service: Urology;  Laterality: N/A;   CYSTOSCOPY WITH URETHRAL DILATATION N/A 05/01/2022   Procedure: BALLOON DILATION WITH  OPTILUME;  Surgeon: Elisabeth Valli BIRCH, MD;  Location: [facility] ORS;  Service: Urology;  Laterality: N/A;   LAPAROTOMY N/A 12/03/2022   Procedure: EXPLORATORY LAPAROTOMY  total abdominal colectomy with end ileostomy;  Surgeon: Kinsinger, Herlene Righter, MD;  Location: [facility] OR;  Service: General;  Laterality: N/A;   MULTIPLE TOOTH EXTRACTIONS     POSTERIOR CERVICAL FUSION/FORAMINOTOMY N/A 11/29/2015   Procedure: Cervical Three-Cervical Seven Posterior Cervical Laminectomy with Fusion;  Surgeon: Gerldine Maizes, MD;  Location: [facility];  Service: Neurosurgery;  Laterality: N/A;  Cervical Three-Cervical Seven Posterior Cervical Laminectomy with Fusion   STUMP REVISION Right 10/22/2018   Procedure:  REVISION RIGHT BELOW KNEE AMPUTATION;  Surgeon: Harden Jerona GAILS, MD;  Location: [facility] OR;  Service: Orthopedics;  Laterality: Right;   STUMP REVISION Right 12/05/2018   Procedure: Revision Right Above Knee Amputation;  Surgeon: Harden Jerona GAILS, MD;  Location: [facility] OR;  Service: Orthopedics;  Laterality: Right;   STUMP REVISION Right 05/29/2019   Procedure: REVISION RIGHT ABOVE KNEE AMPUTATION;  Surgeon: Harden Jerona GAILS, MD;  Location: [facility] OR;  Service: Orthopedics;  Laterality: Right;   STUMP REVISION Right 06/24/2019   Procedure: STUMP REVISION;  Surgeon: Harden Jerona GAILS, MD;  Location: [facility] OR;  Service: Orthopedics;  Laterality: Right;    Family History: Family History  Problem Relation Age of Onset   Diabetes Mother    Heart disease Mother    Cancer Mother    Cancer Father    Bone cancer Father    Prostate cancer Father    Cancer Paternal Grandfather    Cancer Other  Diabetes Other     Social History  reports that he has been smoking cigarettes. He has never used smokeless tobacco. He reports that he does not drink alcohol  and does not use drugs.  Allergies  Allergen Reactions   Latex Other (See Comments)    ALLERGIC, per MAR    Medications   Current Facility-Administered Medications:    0.9 %  sodium chloride  infusion, , Intravenous, Continuous, Minor, Elsie RAMAN, NP, Last Rate: 100 mL/hr at 06/27/23 0919, New Bag at 06/27/23 0919   Place/Maintain arterial line, , , Until Discontinued **AND** 0.9 %  sodium chloride  infusion, , Intra-arterial, PRN, Olalere, Adewale A, MD   acetaminophen  (TYLENOL ) tablet 650 mg, 650 mg, Per Tube, Q6H PRN, Claudene Toribio BROCKS, MD, 650 mg at 06/27/23 9057   acyclovir  (ZOVIRAX ) 800 mg in dextrose  5 % 250 mL IVPB, 800 mg, Intravenous, Q24H, Gretel Prentice BIRCH, RPH   ampicillin  (OMNIPEN) 2 g in sodium chloride  0.9 % 100 mL IVPB, 2 g, Intravenous, Q8H, Claudene Toribio BROCKS, MD   azithromycin  (ZITHROMAX ) 500 mg in sodium chloride  0.9 % 250 mL IVPB, 500 mg, Intravenous,  Q24H, Claudene Toribio BROCKS, MD   cefTRIAXone  (ROCEPHIN ) 2 g in sodium chloride  0.9 % 100 mL IVPB, 2 g, Intravenous, Q12H, Claudene Toribio BROCKS, MD   docusate sodium  (COLACE) capsule 100 mg, 100 mg, Oral, BID PRN, Minor, Elsie RAMAN, NP   [START ON 06/28/2023] famotidine  (PEPCID ) tablet 20 mg, 20 mg, Per Tube, Daily, Katsaros, Morna SAUNDERS, RPH   fentaNYL  (SUBLIMAZE ) injection 25-100 mcg, 25-100 mcg, Intravenous, Q2H PRN, Olalere, Adewale A, MD   fentaNYL  in NS (43mcg/ml) infusion-PREMIX, 0-400 mcg/hr, Intravenous, Continuous, Pollina, Lonni PARAS, MD, Last Rate: 17.5 mL/hr at 06/27/23 1807, 175 mcg/hr at 06/27/23 1807   heparin  injection 5,000 Units, 5,000 Units, Subcutaneous, Q8H, Minor, Elsie RAMAN, NP, 5,000 Units at 06/27/23 1317   insulin  aspart (novoLOG ) injection 0-6 Units, 0-6 Units, Subcutaneous, Q4H, Claudene Toribio BROCKS, MD   metroNIDAZOLE  (FLAGYL ) IVPB 500 mg, 500 mg, Intravenous, Q12H, Claudene Toribio BROCKS, MD   norepinephrine  (LEVOPHED ) 4mg  in (0.016 mg/mL) premix infusion, 0-40 mcg/min, Intravenous, Titrated, Olalere, Adewale A, MD, Last Rate: 7.5 mL/hr at 06/27/23 1311, 2 mcg/min at 06/27/23 1311   polyethylene glycol (MIRALAX  / GLYCOLAX ) packet 17 g, 17 g, Oral, Daily PRN, Minor, Elsie RAMAN, NP   propofol  (DIPRIVAN ) 1000 MG/100ML infusion, 5-80 mcg/kg/min, Intravenous, Titrated, Olalere, Adewale A, MD, Last Rate: 15.12 mL/hr at 06/27/23 1803, 30 mcg/kg/min at 06/27/23 1803   sodium zirconium cyclosilicate  (LOKELMA ) packet 5 g, 5 g, Oral, BID, Claudene Toribio BROCKS, MD, 5 g at 06/27/23 9056   vancomycin  variable dose per unstable renal function (pharmacist dosing), , Does not apply, See admin instructions, Claudene Toribio BROCKS, MD  Vitals   Vitals:   06/27/23 1000 06/27/23 1015 06/27/23 1100 06/27/23 1200  BP: 99/63 110/85 116/67 91/60  Pulse: (!) 107   (!) 116  Resp: (!) 21 17 (!) 28 19  Temp: (!) 103.4 F (39.7 C) (!) 103.3 F (39.6 C) (!) 103.5 F (39.7 C) (!) 104.9 F (40.5 C)  TempSrc:    Axillary Bladder  SpO2: 99%   (!) 89%  Weight:      Height:        Body mass index is 25.11 kg/m.  Physical Exam   Constitutional: Critically and Chronically ill  Eyes: No scleral injection.  HENT: No OP obstruction. OG/ETT in place.  Head: Normocephalic.  Cardiovascular: Tachycardic, 120-130 on monitor.  Respiratory: Mechanically ventilated. Labored, abdominal breathing, tachypneic.  GI: Distended. Colostomy bag present.  Skin: Reddened, swollen LLE with multiple toe amputations. RLE amputation.   Neurologic Examination   Patient is intubated. Opens eyes slightly and briefly to voice. Does not keep them open or follow any commands. Roving eye movements, does not track examiner.  He withdraws slightly in his upper extremities, does not withdraw LLE. No spontaneous movement seen.   Labs/Imaging/Neurodiagnostic studies   CBC:  Recent Labs  Lab 07/07/2023 0558 07/07/2023 0838  WBC 13.0*  --   NEUTROABS 11.0*  --   HGB 11.7* 12.6*  HCT 38.1* 37.0*  MCV 95.0  --   PLT 311  --    Basic Metabolic Panel:  Lab Results  Component Value Date   NA 136 07-Jul-2023   K 4.3 2023-07-07   CO2 21 (L) Jul 07, 2023   GLUCOSE 102 (H) 07-07-2023   BUN 50 (H) 07-Jul-2023   CREATININE 2.82 (H) Jul 07, 2023   CALCIUM  8.1 (L) 07-07-2023   GFRNONAA 24 (L) July 07, 2023   GFRAA 42 (L) 02/24/2020   Lipid Panel:  Lab Results  Component Value Date   LDLCALC 18 11/17/2018   HgbA1c:  Lab Results  Component Value Date   HGBA1C 6.4 (H) July 07, 2023   Urine Drug Screen:     Component Value Date/Time   LABOPIA NONE DETECTED 2023-07-07 0748   COCAINSCRNUR NONE DETECTED Jul 07, 2023 0748   LABBENZ NONE DETECTED 07-07-23 0748   AMPHETMU NONE DETECTED 2023-07-07 0748   THCU NONE DETECTED 2023/07/07 0748   LABBARB NONE DETECTED 07-07-23 0748    Alcohol  Level     Component Value Date/Time   ETH <10 01/24/2022 1930   INR  Lab Results  Component Value Date   INR 1.1 2023-07-07   APTT  Lab  Results  Component Value Date   APTT 32 2023/07/07    CT Head without contrast(Personally reviewed): - New loss of the normal gray-white matter differentiation  along inferior right temporal lobe, concerning for acute infarct.  - Old right occipital lobe infarct - No hemorrhage   ASSESSMENT   SLOAN TAKAGI is a 63 y.o. 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 Per staff, he was seen around 0220 in his normal state. Patient was intubated in the ED for airway protection, had been vomiting en route. Unasyn was started for possible aspiration pneumonia.   CTH showed gray-white differentiation in right temporal area and neurology was consulted. This imaging is concerning for stroke versus HSV encephalitis. Recommend antibiotic/antiviral coverage for this to start immediately while waiting for LP to be performed.   As this patient has multiple possible sources of infection, recommend evaluating these areas before the LP is completed.   With his roving eye movements and shivering, we will hook the patient up to LTM to also evaluate for seizures. No AEDs are needed at this time and we will review LTM results to make further recommendations.   Per daughter, patient had recent bout of multiple blister-like lesions on his hands. She stated he went to the dermatologist and was placed on antibiotics, but was not sure what he was diagnosed with.  Notes are not available in Care Everywhere to see if HSV was mentioned as a possibility for the etiology.  History along with imaging findings  also should raise suspicion for HSV encephalitis. .  Patient is critically ill and neurology is available as needed for further family discussions.   RECOMMENDATIONS   - MRI Brain to further evaluate stroke versus HSV encephalitis-will need to be w/o contrast due to AKI - CT C/A/P to determine possible source of infection -Start meningitis/encephalitis coverage with antibiotics and antivirals.  Pharmacy consulted. - LTM EEG, no need for AEDs at this time. Future recommendations based on LTM results.  Patient will have MRI safe leads put on so that MRI is not delayed in case the EEG technologist gets to him before he is able to go for MRI. - LP if other source of infection is not found - Supportive care per primary  We will follow along.  ______________________________________________________________________   Pt seen by Neuro NP/APP and later by MD. Note/plan to be edited by MD as needed.    Rocky JAYSON Likes, DNP, AGACNP-BC [facility] Please use AMION for contact information & EPIC for messaging.  Attending addendum I was asked by Dr. Rolan Sharps from PCCM to evaluate this patient after brain imaging showed abnormal findings. 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  Facility reported increasing dark liquid stools from the ostomy.  He was intubated in the ER for airway protection.  Has an extremely poor baseline, living in a nursing home for the last multiple years.  He had leukocytosis on arrival and AKI.  ET tube secretions and urine appeared abnormal on inspection due to the EDP. He is unable to provide any history.  Daughter was at bedside.  Review of systems not possible due to his sedated intubated state.  Daughter  provides some review of systems and says that he had some blisters concerning for possible shingles at some point in the last few weeks.  Vitals: Febrile, hypotensive requiring pressors.  O:// On examination which was done after holding propofol , he is breathing significantly over the ventilator, no spontaneous movements at this time, does not follow commands.  To noxious stimulation, appears to be opening eyes, has a left ptosis that is baseline, pupils are equal round reactive to light, has roving eye movements, facial symmetry is difficult to ascertain due to ET tube.  On noxious stimulation there is minimal movement in bilateral upper extremities but no frank localization or withdrawal.  Difficult to examine the lower extremities-has a right AKA and left leg is extremely swollen and red with chronic changes of edema and multiple scabbed wounds.  Laboratory tests evaluation reveals large leukocyte esterase with 21-50 WBCs and few bacteria.  WBC count 13,000.  Hemoglobin 12.6.  Platelet count 311,000.  Sodium and potassium as well as chloride normal.  Glucose 102.  BUN 50, creatinine 2.82 which is elevated from baseline.  Imaging personally reviewed, CT head reveals new area of hypodensity along the right temporal lobe indicating either a new subacute stroke or possible HSV encephalitis. Chest x-ray reveals enlargement of the cardiopericardial silhouette with vascular congestion and diffuse interstitial opacities suggesting edema.  Gaseous distention of stomach or bowel noted under the left hemidiaphragm. Repeat chest x-ray and abdominal x-ray shows NGT curving in the  left and cephalad within the stomach with the tip along the proximal fundus.  Comparison with CT from 02/08/2023-severe dilatation of the stomach with air and food products-correlate clinically for gastric outlet obstruction or impaired gastric emptying.  Worsening central vascular congestion and perihilar  edema.   Assessment:// 63 year old with above past medical history and extremely poor functional baseline brought in for unresponsiveness and noted to have multiple derangements including concern for pneumonia, UTI as well as possible gastric outlet obstruction who on head imaging also revealed a new hypodensity in the right temporal area suggestive of a new subacute stroke versus question for HSV encephalitis.  Impression: Toxic metabolic encephalopathy due to infectious process as above Evaluate for new stroke versus HSV encephalitis  Recommendations://  I have reviewed the recommendations above that I helped formulate.  I have edited the recommendations so that the are all in 1 place for the primary teams to review. Plan was discussed with Dr. Harold and Dr. Claudene of PCCM service   -- Eligio Lav, MD Neurologist [facility]  CRITICAL CARE ATTESTATION Performed by: Eligio Lav, MD Total critical care time: 40 minutes Critical care time was exclusive of separately billable procedures and treating other patients and/or supervising APPs/Residents/Students Critical care was necessary to treat or prevent imminent or life-threatening deterioration. This patient is critically ill and at significant risk for neurological worsening and/or death and care requires constant monitoring. Critical care was time spent personally by me on the following activities: development of treatment plan with patient and/or surrogate as well as nursing, discussions with consultants, evaluation of patient's response to treatment, examination of patient, obtaining history from patient or surrogate, ordering and performing treatments and interventions, ordering and review of laboratory studies, ordering and review of radiographic studies, pulse oximetry, re-evaluation of patient's condition, participation in multidisciplinary rounds and medical decision making of high complexity in the care of this  patient.

## 2023-06-27 NOTE — Progress Notes (Signed)
 Per nurse the patient is not available for EEG due to going to imaging, EEG tech will be notified by nurse once patient has returned from imaging.

## 2023-06-27 NOTE — ED Provider Notes (Signed)
 [place] EMERGENCY DEPARTMENT AT [facility] Provider Note   CSN: [id number] Arrival date & time: 06/27/23  0553     History  Chief Complaint  Patient presents with   Shortness of Breath    Gregory [name] is a 63 y.o. male.  Patient to the emergency department from skilled nursing facility.  He was reportedly seen around 2 AM and was fine.  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  Staff checked on him again this morning to empty his colostomy again and found him unresponsive.  Staff told EMS that he has a history of unintentional overdoses.  Patient has not responded to EMS, has been assisted by bag-valve-mask during transport for oxygen saturations in the 70% range.  He has had vomiting prehospital.       Home Medications Prior to Admission medications   Medication Sig Start Date End Date Taking? Authorizing Provider  acetaminophen  (TYLENOL ) 500 MG tablet Take 1,000 mg by mouth every 8 (eight) hours as needed for mild pain.    [provider]  aspirin  EC 81 MG tablet Take 81 mg by mouth daily. Swallow whole.    [provider]  atorvastatin  (LIPITOR ) 80 MG tablet Take 1 tablet (80 mg total) by mouth daily. Patient taking differently: Take 80 mg by mouth at bedtime. 09/22/22 01/27/24  Cindy Garnette POUR, MD  carvedilol  (COREG ) 12.5 MG tablet Take 1 tablet (12.5 mg total) by mouth 2 (two) times daily with a meal. 12/25/22   Raenelle Coria, MD  cetirizine (ZYRTEC) 10 MG tablet Take 10 mg by mouth daily.    [provider]  Cholecalciferol  25 MCG (1000 UT) capsule Take 1,000 Units by mouth daily.    [provider]  Cranberry 450 MG CAPS Take 450 mg by mouth daily.    [provider]  cyclobenzaprine  (FLEXERIL ) 5 MG tablet Take 5 mg by mouth every 12 (twelve) hours as needed for muscle spasms.    [provider]  Dextran 70-Hypromellose (NATURAL BALANCE TEARS) 0.1-0.3 % SOLN Place 1 drop into both eyes  every 4 (four) hours as needed (dry eyes).    [provider]  escitalopram  (LEXAPRO ) 20 MG tablet Take 20 mg by mouth daily.    [provider]  ferrous sulfate  325 (65 FE) MG tablet Take 1 tablet (325 mg total) by mouth 3 (three) times daily with meals. Patient taking differently: Take 325 mg by mouth daily. With meal. 01/23/23   Bernard Drivers, MD  fluticasone  (FLONASE ) 50 MCG/ACT nasal spray Place 1 spray into both nostrils daily.    [provider]  furosemide  (LASIX ) 40 MG tablet Take 40 mg by mouth daily.    [provider]  hydrALAZINE  (APRESOLINE ) 10 MG tablet Take 1 tablet (10 mg total) by mouth every 8 (eight) hours. 12/25/22   Raenelle Coria, MD  hydrOXYzine  (ATARAX ) 25 MG tablet Take 25 mg by mouth every 6 (six) hours as needed for anxiety.    [provider]  hydrOXYzine  (VISTARIL ) 50 MG capsule Take 50 mg by mouth at bedtime.    [provider]  insulin  glargine (LANTUS  SOLOSTAR) 100 UNIT/ML Solostar Pen Inject 15 Units into the skin daily. Patient taking differently: Inject 15 Units into the skin at bedtime. 09/07/22   Rai, Nydia POUR, MD  insulin  lispro (HUMALOG ) 100 UNIT/ML KwikPen Inject 0-10 Units into the skin See admin instructions. Inject 0-10 units into the skin three times a day and at  bedtime, PER SLIDING SCALE:  BGL 151-200 = 2 units 201-250 = 4 units 251-300 = 6 units 301-350 =  8 units 351-400 = 10 units Patient taking differently: Inject 0-10 Units into the skin See admin instructions. Inject 0-10 units into the skin before meals and at bedtime, PER SLIDING SCALE:  0-150 = 0 units 151-200 = 2 units 201-250 = 4 units 251-300 = 6 units 301-350 =  8 units 351-400 = 10 units 01/31/22   Dahal, Chapman, MD  ipratropium-albuterol  (DUONEB) 0.5-2.5 (3) MG/3ML SOLN Take 3 mLs by nebulization every 6 (six) hours as needed (for shortness of breath).    [provider]  lidocaine  (LIDODERM ) 5 % Place 2 patches onto the  skin daily. Remove & Discard patch within 12 hours or as directed by MD 12/25/22   Raenelle Coria, MD  loperamide  (IMODIUM ) 2 MG capsule Take 1 capsule (2 mg total) by mouth 2 (two) times daily. 12/25/22   Raenelle Coria, MD  magnesium  oxide (MAG-OX) 400 (240 Mg) MG tablet Take 400 mg by mouth in the morning, at noon, and at bedtime.    [provider]  Melatonin 10 MG TABS Take 10 mg by mouth at bedtime.    [provider]  Meth-Hyo-M Bl-Na Phos-Ph Sal (URO-MP) 118 MG CAPS Take 118 mg by mouth 2 (two) times daily.    [provider]  mirabegron  ER (MYRBETRIQ ) 50 MG TB24 tablet Take 50 mg by mouth daily.    [provider]  Multiple Vitamin (MULTIVITAMIN WITH MINERALS) TABS tablet Take 1 tablet by mouth daily. 12/25/22   Raenelle Coria, MD  naloxone  (NARCAN ) 4 MG/0.1ML LIQD nasal spray kit Place 1 spray into the nose as needed (poorly responsive or turning blue).    [provider]  nystatin  (MYCOSTATIN /NYSTOP ) powder Apply 1 Application topically daily. To groin/scrotum    [provider]  omeprazole (PRILOSEC) 20 MG capsule Take 20 mg by mouth at bedtime.    [provider]  ondansetron  (ZOFRAN ) 4 MG tablet Take 4 mg by mouth every 8 (eight) hours as needed for nausea.    [provider]  Oxycodone  HCl 10 MG TABS Take 10 mg by mouth every 4 (four) hours. Hold for sedation.    [provider]  oxymetazoline  (AFRIN) 0.05 % nasal spray Place 1 spray into both nostrils 2 (two) times daily as needed for congestion.    [provider]  pregabalin  (LYRICA ) 100 MG capsule Take 1 capsule (100 mg total) by mouth 2 (two) times daily. 12/25/22 02/08/23  Raenelle Coria, MD  QUEtiapine  (SEROQUEL ) 100 MG tablet Take 100 mg by mouth at bedtime. Patient not taking: Reported on 02/08/2023    [provider]  sodium bicarbonate  650 MG tablet Take 650 mg by mouth 2 (two) times daily.    [provider]  tamsulosin   (FLOMAX ) 0.4 MG CAPS capsule Take 2 capsules (0.8 mg total) by mouth daily. 02/09/23   Lenard Calin, MD  trolamine salicylate (ASPERCREME) 10 % cream Apply 1 Application topically every 6 (six) hours as needed for muscle pain. Right leg    [provider]  TRULICITY  0.75 MG/0.5ML SOPN Inject 0.75 mg into the skin every Thursday. 11/29/22   [provider]      Allergies    Latex    Review of Systems   Review of Systems  Physical Exam Updated Vital Signs BP 106/65   Pulse (!) 110   Temp 97.8 F (36.6 C) (Axillary)  Resp (!) 26   Ht 6' 0.01 (1.829 m)   SpO2 97%   BMI 31.19 kg/m  Physical Exam Constitutional:      General: He is in acute distress.     Appearance: He is ill-appearing.  HENT:     Head: Atraumatic.     Comments: Brown substance crusting around the mouth Eyes:     Comments: Pupils 4 mm bilaterally  Cardiovascular:     Rate and Rhythm: Tachycardia present.  Pulmonary:     Effort: Tachypnea present.     Breath sounds: Decreased breath sounds and rhonchi present.  Skin:    Findings: Erythema (Left lower extremity) and wound (Skin tear or abrasion left knee) present.  Neurological:     Mental Status: He is unresponsive.     ED Results / Procedures / Treatments   Labs (all labs ordered are listed, but only abnormal results are displayed) Labs Reviewed  COMPREHENSIVE METABOLIC PANEL - Abnormal; Notable for the following components:      Result Value   Potassium 5.8 (*)    CO2 21 (*)    Glucose, Bld 106 (*)    BUN 48 (*)    Creatinine, Ser 2.60 (*)    Calcium  8.7 (*)    Albumin  3.1 (*)    GFR, Estimated 27 (*)    All other components within normal limits  CBC WITH DIFFERENTIAL/PLATELET - Abnormal; Notable for the following components:   WBC 13.0 (*)    RBC 4.01 (*)    Hemoglobin 11.7 (*)    HCT 38.1 (*)    Neutro Abs 11.0 (*)    Abs Immature Granulocytes 0.16 (*)    All other components within normal limits  TROPONIN I (HIGH  SENSITIVITY) - Abnormal; Notable for the following components:   Troponin I (High Sensitivity) 83 (*)    All other components within normal limits  CULTURE, BLOOD (ROUTINE X 2)  CULTURE, BLOOD (ROUTINE X 2)  RESP PANEL BY RT-PCR (RSV, FLU A&B, COVID)  RVPGX2  C DIFFICILE QUICK SCREEN W PCR REFLEX    GASTROINTESTINAL PANEL BY PCR, STOOL (REPLACES STOOL CULTURE)  PROTIME-INR  APTT  BRAIN NATRIURETIC PEPTIDE  URINALYSIS, W/ REFLEX TO CULTURE (INFECTION SUSPECTED)  I-STAT CG4 LACTIC ACID, ED    EKG EKG Interpretation Date/Time:  Thursday June 27 2023 06:07:48 EST Ventricular Rate:  132 PR Interval:    QRS Duration:  138 QT Interval:  329 QTC Calculation: 488 R Axis:   113  Text Interpretation: likely nsr Ventricular premature complex Aberrant complex Nonspecific intraventricular conduction delay TECHNICALLY DIFFICULT background noise Baseline wander Otherwise no significant change Confirmed by Emil Share [phone]) on 06/27/2023 6:59:48 AM  Radiology DG Chest Port 1 View Result Date: 06/27/2023 CLINICAL DATA:  Unresponsive. EXAM: PORTABLE CHEST 1 VIEW COMPARISON:  02/08/2023 FINDINGS: The cardio pericardial silhouette is enlarged. Vascular congestion diffuse noted with diffuse interstitial opacity suggesting edema. No focal consolidation. No pneumothorax or pleural effusion. Gaseous distention of stomach or bowel is seen in the the left hemidiaphragm. Telemetry leads overlie the chest. IMPRESSION: Enlargement of the cardiopericardial silhouette with vascular congestion and diffuse interstitial opacity suggesting edema. Gaseous distention of stomach or bowel noted under the left hemidiaphragm. Electronically Signed   By: Camellia Candle M.D.   On: 06/27/2023 06:32    Procedures Procedure Name: Intubation Date/Time: 06/27/2023 6:29 AM  Performed by: Haze Lonni PARAS, MDPre-anesthesia Checklist: Patient identified, Patient being monitored, Emergency Drugs available, Timeout performed and  Suction available Oxygen Delivery  Method: Non-rebreather mask Preoxygenation: Pre-oxygenation with 100% oxygen Induction Type: Rapid sequence Ventilation: Mask ventilation without difficulty Laryngoscope Size: Glidescope and 4 Grade View: Grade I Tube size: 7.5 mm Number of attempts: 1 Placement Confirmation: ETT inserted through vocal cords under direct vision, CO2 detector and Breath sounds checked- equal and bilateral Secured at: 23 cm Tube secured with: Tape        Medications Ordered in ED Medications  etomidate  (AMIDATE ) injection (20 mg Intravenous Given 06/27/23 0620)  succinylcholine  (ANECTINE ) injection (100 mg Intravenous Given 06/27/23 0620)  fentaNYL  in NS (22mcg/ml) infusion-PREMIX (25 mcg/hr Intravenous New Bag/Given 06/27/23 0658)  naloxone  (NARCAN ) injection 1 mg (1 mg Intravenous Given 06/27/23 0604)  fentaNYL  (SUBLIMAZE ) injection 200 mcg (200 mcg Intravenous Not Given 06/27/23 9362)    ED Course/ Medical Decision Making/ A&P                                 Medical Decision Making Amount and/or Complexity of Data Reviewed Labs: ordered. Decision-making details documented in ED Course. Radiology: ordered and independent interpretation performed. Decision-making details documented in ED Course. ECG/medicine tests: ordered and independent interpretation performed. Decision-making details documented in ED Course.  Risk Prescription drug management.   Differential diagnosis considered includes, but not limited to: TIA; Stroke; ICH; Seizure; electrolyte abnormality; hypoglycemia; toxic/pharmacologic causes; CNS infection; psychiatric disorder  Arrives via EMS from nursing home with mental status changes.  Nursing home staff told EMS that the patient has surreptitiously taken meds and overdosed in the past.  There was concern for possible overdose at arrival but patient had vomited, was in respiratory distress and therefore there was concern for aspiration  as well.  Arrival to the emergency department it was noted nursing home documentation reported that the patient was a DNR but also full code.  I did attempt to call his contact but it went straight to voicemail.  A review of his records reveals he has always been a full code in the hospital including recently.  He was therefore felt that the DNR on the nursing home documentation was in error.  He did not have a DNR form with him.  Patient was reportedly dyspneic for EMS but arrives tachypneic.  His pupils are not pinpoint, presentation not specifically clearly consistent with opioid overdose.  He is not appropriately responding to stimuli or voice.  A trial of Narcan  was administered.  He seemed to become more agitated but not more alert.  It was then decided to intubate him to protect his airway and facilitate workup.  Patient with copious amounts of liquid stool into his colostomy requiring multiple drainages of the bag during workup.  Stool studies sent.  With chronic renal insufficiency, creatinine at about his baseline but does have mild elevation of potassium at 5.8, no EKG changes.  Chest x-ray suspicious for volume overload, will initiate Lasix  which will treat both.  Unasyn empirically for suspected aspiration pneumonia.  CRITICAL CARE Performed by: Lonni JINNY Seats   Total critical care time: 32 minutes  Critical care time was exclusive of separately billable procedures and treating other patients.  Critical care was necessary to treat or prevent imminent or life-threatening deterioration.  Critical care was time spent personally by me on the following activities: development of treatment plan with patient and/or surrogate as well as nursing, discussions with consultants, evaluation of patient's response to treatment, examination of patient, obtaining history from patient or  surrogate, ordering and performing treatments and interventions, ordering and review of laboratory  studies, ordering and review of radiographic studies, pulse oximetry and re-evaluation of patient's condition.          Final Clinical Impression(s) / ED Diagnoses Final diagnoses:  Encephalopathy, unspecified type  Acute respiratory failure with hypoxia [facility])    Rx / DC Orders ED Discharge Orders     None         Apolonia Ellwood, Lonni PARAS, MD 06/27/23 [phone]

## 2023-06-27 NOTE — Progress Notes (Addendum)
 RT attempted ABG x2 without success, no new orders given at this time. CCM MD notified, RT will monitor as needed.

## 2023-06-27 NOTE — Progress Notes (Addendum)
 RT x2  attempted Aline x2 without success. CCM MD notified. RT will monitor as needed.

## 2023-06-27 NOTE — Procedures (Signed)
 Central Venous Catheter Insertion Procedure Note Gregory [name] [id number] 07-Feb-1961  Procedure: Insertion of Central Venous Catheter Indications: Assessment of intravascular volume and Drug and/or fluid administration  Procedure Details Consent: Risks of procedure as well as the alternatives and risks of each were explained to the (patient/caregiver).  Consent for procedure obtained. Time Out: Verified patient identification, verified procedure, site/side was marked, verified correct patient position, special equipment/implants available, medications/allergies/relevent history reviewed, required imaging and test results available.  Performed  Maximum sterile technique was used including antiseptics, cap, gloves, gown, hand hygiene, mask, and sheet. Skin prep: Chlorhexidine ; local anesthetic administered A antimicrobial bonded/coated triple lumen catheter was placed in the right internal jugular vein using the Seldinger technique.  Evaluation Blood flow good Complications: No apparent complications Patient did tolerate procedure well. Chest X-ray ordered to verify placement.  CXR: pending.  Gregory [name] 06/27/2023, 12:21 PM

## 2023-06-27 NOTE — Progress Notes (Addendum)
 Pharmacy Antibiotic Note  Gregory [name] is a 63 y.o. male admitted on 06/27/2023 with possible meningitis.  Pharmacy has been consulted for acyclovir  dosing. He is also on rocephin  2gm IV q12h, ampicillin  2gm IV q8h and vancomycin   -Wt= 84 kg -SCr= 2.82 (trend up, baseline ~ 2.0), CrCl ~ 30 -He is already on 0.9 NS at 100 ml/hr    Plan: -Acyclovir  800mg  IV Q24hr -No additional IV fluids need this time (for prevention of renal toxicity) -No ampicillin  adjustments need at this time -A vancomycin  level has been ordered for 1/10  Height: 6' 0.01 (182.9 cm) Weight: 84 kg (185 lb 3 oz) IBW/kg (Calculated) : 77.62  Temp (24hrs), Avg:102.5 F (39.2 C), Min:97.8 F (36.6 C), Max:104.9 F (40.5 C)  Recent Labs  Lab 06/27/23 0558 06/27/23 0604 06/27/23 0824 06/27/23 1352  WBC 13.0*  --   --   --   CREATININE 2.60*  --   --  2.82*  LATICACIDVEN  --  1.8 0.9  --     Estimated Creatinine Clearance: 29 mL/min (A) (by C-G formula based on SCr of 2.82 mg/dL (H)).    Allergies  Allergen Reactions   Latex Other (See Comments)    ALLERGIC, per MAR    Antimicrobials this admission: 1/9 zosyn  >> 1/9 1/9 rocephin >> 1/9 ampicillin >> 1/9 vancomycin >> 1/9 acyclovir >>  Dose adjustments this admission:   Microbiology results: 1/9 MRSA PCR-  1/9 urine 1/9 blood x2  Thank you for allowing pharmacy to be a part of this patient's care.  Prentice Poisson, PharmD Clinical Pharmacist **Pharmacist phone directory can now be found on [email or web address] ([email or web address]).  Listed under [facility].

## 2023-06-27 NOTE — Progress Notes (Signed)
 Pharmacy Antibiotic Note  Gregory [name] is a 63 y.o. male admitted on 06/27/2023 with sepsis.  Pharmacy has been consulted for vancomycin  and zosyn  dosing. sCr 2.6, previous ~2.0, will give loading dose and reassess with levels.  Plan: Vancomcyin 2000mg  IV x1 then assess renal function and levels for further dosing Zosyn  3.375G IV q8 hours (4 hour infusion) Vancomycin  levels as appropriate   Height: 6' 0.01 (182.9 cm) IBW/kg (Calculated) : 77.62  Temp (24hrs), Avg:100.3 F (37.9 C), Min:97.8 F (36.6 C), Max:101.7 F (38.7 C)  Recent Labs  Lab 06/27/23 0558 06/27/23 0604 06/27/23 0824  WBC 13.0*  --   --   CREATININE 2.60*  --   --   LATICACIDVEN  --  1.8 0.9    CrCl cannot be calculated (Unknown ideal weight.).    Allergies  Allergen Reactions   Latex Other (See Comments)    ALLERGIC, per [facility]    Thank you for allowing pharmacy to be a part of this patient's care.  Koren LITTIE Adal Sereno 06/27/2023 8:51 AM

## 2023-06-27 NOTE — Progress Notes (Signed)
 Urine noted to be blue-green  Send UA

## 2023-06-28 ENCOUNTER — Inpatient Hospital Stay (HOSPITAL_COMMUNITY): Payer: Medicaid Other

## 2023-06-28 DIAGNOSIS — R569 Unspecified convulsions: Secondary | ICD-10-CM | POA: Diagnosis not present

## 2023-06-28 DIAGNOSIS — R509 Fever, unspecified: Secondary | ICD-10-CM

## 2023-06-28 DIAGNOSIS — G9340 Encephalopathy, unspecified: Secondary | ICD-10-CM | POA: Diagnosis not present

## 2023-06-28 DIAGNOSIS — J9601 Acute respiratory failure with hypoxia: Secondary | ICD-10-CM | POA: Diagnosis not present

## 2023-06-28 DIAGNOSIS — I6389 Other cerebral infarction: Secondary | ICD-10-CM | POA: Diagnosis not present

## 2023-06-28 DIAGNOSIS — I63531 Cerebral infarction due to unspecified occlusion or stenosis of right posterior cerebral artery: Secondary | ICD-10-CM | POA: Diagnosis not present

## 2023-06-28 LAB — BASIC METABOLIC PANEL
Anion gap: 11 (ref 5–15)
Anion gap: 11 (ref 5–15)
BUN: 53 mg/dL — ABNORMAL HIGH (ref 8–23)
BUN: 54 mg/dL — ABNORMAL HIGH (ref 8–23)
CO2: 18 mmol/L — ABNORMAL LOW (ref 22–32)
CO2: 18 mmol/L — ABNORMAL LOW (ref 22–32)
Calcium: 8.1 mg/dL — ABNORMAL LOW (ref 8.9–10.3)
Calcium: 8.3 mg/dL — ABNORMAL LOW (ref 8.9–10.3)
Chloride: 104 mmol/L (ref 98–111)
Chloride: 104 mmol/L (ref 98–111)
Creatinine, Ser: 2.62 mg/dL — ABNORMAL HIGH (ref 0.61–1.24)
Creatinine, Ser: 2.56 mg/dL — ABNORMAL HIGH (ref 0.61–1.24)
GFR, Estimated: 26 mL/min — ABNORMAL LOW (ref >60)
GFR, Estimated: 27 mL/min — ABNORMAL LOW (ref >60)
Glucose, Bld: 139 mg/dL — ABNORMAL HIGH (ref 70–99)
Glucose, Bld: 158 mg/dL — ABNORMAL HIGH (ref 70–99)
Potassium: 5.7 mmol/L — ABNORMAL HIGH (ref 3.5–5.1)
Potassium: 4.5 mmol/L (ref 3.5–5.1)
Sodium: 133 mmol/L — ABNORMAL LOW (ref 135–145)
Sodium: 133 mmol/L — ABNORMAL LOW (ref 135–145)

## 2023-06-28 LAB — POCT I-STAT 7, (LYTES, BLD GAS, ICA,H+H)
Acid-base deficit: 7 mmol/L — ABNORMAL HIGH (ref 0.0–2.0)
Acid-base deficit: 7 mmol/L — ABNORMAL HIGH (ref 0.0–2.0)
Bicarbonate: 20.4 mmol/L (ref 20.0–28.0)
Bicarbonate: 20.4 mmol/L (ref 20.0–28.0)
Calcium, Ion: 1.18 mmol/L (ref 1.15–1.40)
Calcium, Ion: 1.22 mmol/L (ref 1.15–1.40)
HCT: 33 % — ABNORMAL LOW (ref 39.0–52.0)
HCT: 31 % — ABNORMAL LOW (ref 39.0–52.0)
Hemoglobin: 11.2 g/dL — ABNORMAL LOW (ref 13.0–17.0)
Hemoglobin: 10.5 g/dL — ABNORMAL LOW (ref 13.0–17.0)
O2 Saturation: 95 %
O2 Saturation: 96 %
Patient temperature: 99
Patient temperature: 99.1
Potassium: 5.8 mmol/L — ABNORMAL HIGH (ref 3.5–5.1)
Potassium: 5.1 mmol/L (ref 3.5–5.1)
Sodium: 134 mmol/L — ABNORMAL LOW (ref 135–145)
Sodium: 135 mmol/L (ref 135–145)
TCO2: 22 mmol/L (ref 22–32)
TCO2: 22 mmol/L (ref 22–32)
pCO2 arterial: 51.2 mm[Hg] — ABNORMAL HIGH (ref 32–48)
pCO2 arterial: 51.2 mm[Hg] — ABNORMAL HIGH (ref 32–48)
pH, Arterial: 7.21 — ABNORMAL LOW (ref 7.35–7.45)
pH, Arterial: 7.211 — ABNORMAL LOW (ref 7.35–7.45)
pO2, Arterial: 91 mm[Hg] (ref 83–108)
pO2, Arterial: 99 mm[Hg] (ref 83–108)

## 2023-06-28 LAB — CBC
HCT: 35.1 % — ABNORMAL LOW (ref 39.0–52.0)
Hemoglobin: 10.9 g/dL — ABNORMAL LOW (ref 13.0–17.0)
MCH: 28.8 pg (ref 26.0–34.0)
MCHC: 31.1 g/dL (ref 30.0–36.0)
MCV: 92.6 fL (ref 80.0–100.0)
Platelets: 280 10*3/uL (ref 150–400)
RBC: 3.79 MIL/uL — ABNORMAL LOW (ref 4.22–5.81)
RDW: 15.2 % (ref 11.5–15.5)
WBC: 27.5 10*3/uL — ABNORMAL HIGH (ref 4.0–10.5)
nRBC: 0 % (ref 0.0–0.2)

## 2023-06-28 LAB — GASTROINTESTINAL PANEL BY PCR, STOOL (REPLACES STOOL CULTURE)

## 2023-06-28 LAB — ECHOCARDIOGRAM COMPLETE
AR max vel: 2.08 cm2
AV Area VTI: 2.25 cm2
AV Area mean vel: 2.16 cm2
AV Mean grad: 10 mm[Hg]
AV Peak grad: 20.2 mm[Hg]
Ao pk vel: 2.25 m/s
Area-P 1/2: 4.21 cm2
Height: 72.008 in
S' Lateral: 3.6 cm
Weight: 3400.38 [oz_av]

## 2023-06-28 LAB — TROPONIN I (HIGH SENSITIVITY): Troponin I (High Sensitivity): 4359 ng/L (ref <18)

## 2023-06-28 LAB — GLUCOSE, CAPILLARY
Glucose-Capillary: 104 mg/dL — ABNORMAL HIGH (ref 70–99)
Glucose-Capillary: 105 mg/dL — ABNORMAL HIGH (ref 70–99)
Glucose-Capillary: 121 mg/dL — ABNORMAL HIGH (ref 70–99)
Glucose-Capillary: 136 mg/dL — ABNORMAL HIGH (ref 70–99)
Glucose-Capillary: 141 mg/dL — ABNORMAL HIGH (ref 70–99)
Glucose-Capillary: 167 mg/dL — ABNORMAL HIGH (ref 70–99)
Glucose-Capillary: 53 mg/dL — ABNORMAL LOW (ref 70–99)

## 2023-06-28 LAB — MAGNESIUM: Magnesium: 1.5 mg/dL — ABNORMAL LOW (ref 1.7–2.4)

## 2023-06-28 LAB — STREP PNEUMONIAE URINARY ANTIGEN: Strep Pneumo Urinary Antigen: NEGATIVE

## 2023-06-28 LAB — TRIGLYCERIDES: Triglycerides: 377 mg/dL — ABNORMAL HIGH (ref <150)

## 2023-06-28 LAB — VANCOMYCIN, RANDOM: Vancomycin Rm: 17 ug/mL

## 2023-06-28 LAB — PHOSPHORUS: Phosphorus: 5.3 mg/dL — ABNORMAL HIGH (ref 2.5–4.6)

## 2023-06-28 MED ORDER — PROSOURCE TF20 ENFIT COMPATIBL EN LIQD
60.0000 mL | Freq: Two times a day (BID) | ENTERAL | Status: DC
  Administered 2023-06-28: 60 mL
  Administered 2023-06-28: 20 mL
  Administered 2023-06-29 – 2023-07-08 (×19): 60 mL
  Filled 2023-06-28 (×21): qty 60

## 2023-06-28 MED ORDER — MEPERIDINE HCL 25 MG/ML IJ SOLN
12.5000 mg | Freq: Once | INTRAMUSCULAR | Status: AC
  Administered 2023-06-28: 12.5 mg via INTRAVENOUS
  Filled 2023-06-28: qty 1

## 2023-06-28 MED ORDER — MAGNESIUM SULFATE 4 GM/100ML IV SOLN
4.0000 g | Freq: Once | INTRAVENOUS | Status: AC
  Administered 2023-06-28: 4 g via INTRAVENOUS
  Filled 2023-06-28: qty 100

## 2023-06-28 MED ORDER — BUDESONIDE 0.25 MG/2ML IN SUSP
0.2500 mg | Freq: Two times a day (BID) | RESPIRATORY_TRACT | Status: DC
Start: 2023-06-28 — End: 2023-07-11
  Administered 2023-06-28 – 2023-07-10 (×24): 0.25 mg via RESPIRATORY_TRACT
  Filled 2023-06-28 (×25): qty 2

## 2023-06-28 MED ORDER — ARFORMOTEROL TARTRATE 15 MCG/2ML IN NEBU
15.0000 ug | INHALATION_SOLUTION | Freq: Two times a day (BID) | RESPIRATORY_TRACT | Status: DC
  Administered 2023-06-28 – 2023-07-10 (×24): 15 ug via RESPIRATORY_TRACT
  Filled 2023-06-28 (×25): qty 2

## 2023-06-28 MED ORDER — STROKE: EARLY STAGES OF RECOVERY BOOK
Freq: Once | Status: AC
  Filled 2023-06-28: qty 1

## 2023-06-28 MED ORDER — LIDOCAINE 1 % OPTIME INJ - NO CHARGE
5.0000 mL | Freq: Once | INTRAMUSCULAR | Status: DC
  Filled 2023-06-28: qty 6

## 2023-06-28 MED ORDER — MUPIROCIN 2 % EX OINT
1.0000 | TOPICAL_OINTMENT | Freq: Two times a day (BID) | CUTANEOUS | Status: AC
  Administered 2023-06-28 – 2023-07-02 (×9): 1 via NASAL
  Filled 2023-06-28 (×2): qty 22

## 2023-06-28 MED ORDER — ASPIRIN 81 MG PO CHEW: 81.0000 mg | CHEWABLE_TABLET | Freq: Every day | ORAL | Status: DC

## 2023-06-28 MED ORDER — ASPIRIN 81 MG PO CHEW
81.0000 mg | CHEWABLE_TABLET | Freq: Every day | ORAL | Status: DC
  Administered 2023-06-28 – 2023-07-08 (×11): 81 mg
  Filled 2023-06-28 (×11): qty 1

## 2023-06-28 MED ORDER — ATORVASTATIN CALCIUM 40 MG PO TABS
80.0000 mg | ORAL_TABLET | Freq: Every day | ORAL | Status: DC
  Administered 2023-06-29 – 2023-07-08 (×10): 80 mg
  Filled 2023-06-28 (×11): qty 2

## 2023-06-28 MED ORDER — PIPERACILLIN-TAZOBACTAM 3.375 G IVPB
3.3750 g | Freq: Three times a day (TID) | INTRAVENOUS | Status: DC
  Administered 2023-06-28 – 2023-06-29 (×2): 3.375 g via INTRAVENOUS
  Filled 2023-06-28: qty 50

## 2023-06-28 MED ORDER — VITAL 1.5 CAL PO LIQD
1000.0000 mL | ORAL | Status: DC
  Administered 2023-06-28 – 2023-06-30 (×6): 1000 mL

## 2023-06-28 MED ORDER — SODIUM CHLORIDE 0.9 % IV SOLN: INTRAVENOUS | Status: AC

## 2023-06-28 MED ORDER — MIDAZOLAM HCL 2 MG/2ML IJ SOLN
2.0000 mg | Freq: Once | INTRAMUSCULAR | Status: AC
  Administered 2023-06-28: 2 mg via INTRAVENOUS
  Filled 2023-06-28: qty 2

## 2023-06-28 MED ORDER — VANCOMYCIN HCL IN DEXTROSE 1-5 GM/200ML-% IV SOLN
1000.0000 mg | Freq: Once | INTRAVENOUS | Status: AC
  Administered 2023-06-28: 1000 mg via INTRAVENOUS
  Filled 2023-06-28: qty 200

## 2023-06-28 MED ORDER — DEXTROSE 5 % IV SOLN
900.0000 mg | INTRAVENOUS | Status: DC
  Administered 2023-06-28: 900 mg via INTRAVENOUS
  Filled 2023-06-28 (×3): qty 18

## 2023-06-28 MED ORDER — DEXTROSE 50 % IV SOLN
INTRAVENOUS | Status: AC
  Administered 2023-06-28: 50 mL
  Filled 2023-06-28: qty 50

## 2023-06-28 MED ORDER — PIPERACILLIN-TAZOBACTAM 3.375 G IVPB
3.3750 g | Freq: Three times a day (TID) | INTRAVENOUS | Status: DC
  Administered 2023-06-28: 3.375 g via INTRAVENOUS
  Filled 2023-06-28: qty 50

## 2023-06-28 MED ORDER — REVEFENACIN 175 MCG/3ML IN SOLN
175.0000 ug | Freq: Every day | RESPIRATORY_TRACT | Status: DC
  Administered 2023-06-29 – 2023-07-09 (×11): 175 ug via RESPIRATORY_TRACT
  Filled 2023-06-28 (×13): qty 3

## 2023-06-28 MED ORDER — PIVOT 1.5 CAL PO LIQD
1000.0000 mL | ORAL | Status: DC
  Filled 2023-06-28: qty 1000

## 2023-06-28 NOTE — Plan of Care (Signed)
 Bedside and fluoro LP unsuccessful, no CSF return. Still not able to rule out CNS infection at this time, discussed with Dr. Claudene and pharmacy, will re-start CNS Abx coverage including acyclovir  given hx of HSV dermatitis. EEG no seizure at this time.   Ary Cummins, MD PhD Stroke Neurology 06/28/2023 7:20 PM

## 2023-06-28 NOTE — Progress Notes (Signed)
 Initial Nutrition Assessment  DOCUMENTATION CODES:   Not applicable  INTERVENTION:   Initiate trickle tube feeding only today via OG: Vital 1.5 at 20 ml/hr  Once able to advance, titrate by 10 mL q 8 hours until goal of 60 ml/hr achieved  Vital 1.5 at 60 ml/h (1440 ml per day) Prosource TF20 60 ml BID  TF at goal rate provides 2,320 kcal, 137 gm protein, 1100 ml free water  daily  Monitor Mg, K+, and PHOS d/t risk of refeeding and replete as indicated  NUTRITION DIAGNOSIS:  Inadequate oral intake related to inability to eat as evidenced by NPO status.  GOAL:  Patient will meet greater than or equal to 90% of their needs  MONITOR:  Vent status, Labs, Weight trends, TF tolerance, Skin, I & O's  REASON FOR ASSESSMENT:  Consult Enteral/tube feeding initiation and management  ASSESSMENT:  Pt to ED from SNF after being found unresponsive. Dx of severe UTI, RSV, and bilateral PNA. 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 PMH: COPD, a-fib, CKD 3, T2DM, HFeEF, HTN, HLD, colostomy. Noted with recent increased amount of liquid stool from his ostomy.  Patient currently intubated with AKI and some leukocytosis. Urine w/ blue green color. Neurology recommending palliative consult for GOC discussion.  NG/OT tube currently being used for decompression with dark brown/red secretions noted that are grainy in texture (apprx ). Upon review of of abdominal xray, noted with severe dilation of the stomach with air and food products. Concerning for gastric outlet obstruction or impaired gastric emptying. OG tube in place and curves to the left within the stomach w/ tip along the proximal fundus.  Levophed  at 8 and patient on propofol . No family at bedside. Unable to endorse UBW or recent weight trends.   Admit Weight: 96.4kg  Noted to be 104kg five months ago. Does have a varied weight hx, per chart review. Significant edema noted to LLE. Hx of R AKA. No  redness/swelling noted.   Labs:  CBGs 102-139mg /dL J8r 6.4 Mg 1.5 K 4.8<--4.1 PHOS 5.3 BUN 53 Crt 2.62 Triglycerides 377 WBC 27.5<--13.0  Meds: famotidine , SSI, IV ABX, fentanyl , magnesium  sulfate, propofol , levophed   NUTRITION - FOCUSED PHYSICAL EXAM:  Flowsheet Row Most Recent Value  Orbital Region No depletion  Upper Arm Region Mild depletion  Thoracic and Lumbar Region No depletion  Buccal Region Unable to assess  Temple Region No depletion  Clavicle Bone Region No depletion  Clavicle and Acromion Bone Region No depletion  Scapular Bone Region Mild depletion  Dorsal Hand Unable to assess  Patellar Region Unable to assess  [R AKA, LLE w/ wounds and significant swelling]  Anterior Thigh Region Unable to assess  [edema]  Posterior Calf Region Unable to assess  [RAKA, unable to assess L d/t swelling/wounds]  Edema (RD Assessment) Severe  Hair Reviewed  Eyes Reviewed  Mouth Unable to assess  Skin Reviewed  Nails Unable to assess   Diet Order:   Diet Order             Diet NPO time specified  Diet effective now             EDUCATION NEEDS:   Not appropriate for education at this time  Skin:  Skin Assessment: Reviewed RN Assessment  Last BM:  01/10  Height:  Ht Readings from Last 1 Encounters:  06/27/23 6' 0.01 (1.829 m)    Weight:  Wt Readings from Last 1 Encounters:  06/28/23 96.4 kg   Ideal Body  Weight:  80.9 kg  Estimated Nutritional Needs:   Kcal:  2100-2300 kcal  Protein:  120-130g  Fluid:  >2L/day  Blair Deaner MS, RD, LDN Registered Dietitian Clinical Nutrition RD Inpatient Contact Info in Amion

## 2023-06-28 NOTE — Progress Notes (Signed)
 NAME:  Gregory [name], MRN:  [id number], DOB:  01-20-1961, LOS: 1 ADMISSION DATE:  06/27/2023, CONSULTATION DATE:  06/27/2023 REFERRING MD:  EDP, CHIEF COMPLAINT:  AMS   History of Present Illness:  63 year old male with significant past medical history including diabetes, hypertension, hyperlipidemia, CKDIIIb, COPD, chronic osteomyelitis s/p R AKA, L midfoot amputation, atrial fibrillation, HFpEF, mesenteric ischemia s/p colostomy 12/2022 who presented to ED from SNF for altered mental status/obtunded. Reported increased liquid stool from ostomy. He was subsequently intubated in the ED. Labs in ED notable for K 5.8, Cr 2.6, WBC 13, trop 83>705, BNP 63, UA large leuk, wbc21-50 few bacteria, UDS negative. Admitted to PCCM.   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 Stroke team/neuro consulted. Recommend stroke work up, d/c meningitis/encephalitis coverage, palliative consult.   Interim History / Subjective:  Patient unable to participate in the subjective portion of the exam. He is intubated, sedated. Does not withdraw to pain stimulus. He spontaneously moves his upper extremities but not on command or in response to pain. +gag/cough. No seizure like activity. Vent settings 60/5.   Objective   Blood pressure 97/60, pulse (!) 119, temperature 98.9 F (37.2 C), temperature source Oral, resp. rate (!) 21, height 6' 0.01 (1.829 m), weight 96.4 kg, SpO2 99%.    Vent Mode: PRVC FiO2 (%):  [60 %-100 %] 60 % Set Rate:  [22 bmp] 22 bmp Vt Set:  [600 mL] 600 mL PEEP:  [8 cmH20] 8 cmH20 Plateau Pressure:  [18 cmH20-21 cmH20] 20 cmH20   Intake/Output Summary (Last 24 hours) at 06/28/2023 0727 Last  data filed at 06/28/2023 0610 Gross per 24 hour  Intake 3888.56 ml  Output 995 ml  Net 2893.56 ml   Filed Weights   06/27/23 0631 06/28/23 0500  Weight: 84 kg 96.4 kg    Examination: General: acute on chronic, critically ill appearing male, laying in bed, no acute distress HENT: [place]/AT, anicteric sclera, mucous membranes moist, ETT, OGT  Lungs: diminished right lung sounds with rhonchi > left, no wheezing or rales, PRVC 60/5, RR 28.  Cardiovascular: s1s2 without murmur, rub, gallop  Abdomen: rounded, non-distended +BS, colostomy present with stool in bag. Ostomy appears red and beefy  Extremities: R AKA, L midfoot amputation. L leg appears chronically swollen, multiple scabs  Neuro: sedated. Flickers eyes. Spontaneously moves the R>L upper extremity at random. Does not withdraw to pain stimulus. No withdraw to pain BLE. +gag/cough.  GU: foley with bright green uop  Resolved Hospital Problem list     Assessment & Plan:  Septic shock  Acute hypoxic respiratory failure 2/2 multifocal pneumonia  Presenting with febrile illness, high fever up to 105. Thick tenacious secretions, evidence of multifocal pneumonia on CXR. WBC 27.5. resp panel +rhinovirus. Procal 1.65. Cdiff negative. GIPP pending. Resp culture prelim + GNR.  - d/c meningitis/encephalitis coverage  - change to vanc/zosyn /azithro (+MRSA PCR, pseudomonal and atypical coverage)  - titrate vasopressor to MAP goal >65  - full mechanical vent support - lung protective ventilation 6-8cc/kg Vt - VAP and PAD bundle in place  - titrate FiO2 to sat goal >92  - maintain peak/plats <30, driving pressures <84    Acute right PCA territory infarct  Altered mental status  Prior stroke  - neuro following, okay with dc meningitis coverage  - stroke team following, appreciate recs which are pending  - cLTM until neuro okay with d/c, no evidence of seizure activity  - F/u Echo, lipid panel  - Frequent neuro checks - Neuroprotective  measures: HOB > 30 degrees, normoglycemia, normothermia, electrolytes WNL - PT/OT/SLP when able to participate in care   Elevated troponin  Likely demand related  - f/u single troponin repeat   Acute kidney injury on chronic kidney disease IIIb; appears to have baseline sCr ~2-2.2. most recent BUN 53, sCr 2.62 Hyperkalemia; most recent 5.7 Likely has slight increase in baseline from septic ATN vs pre-renal azotemia if he had ongoing hypotension prior to arrival given shock state.  - trend bmp, mag, phos - will not correct K at this time. No EKG changes, ectopy. Not significantly elevated. Will trend for now.  - replete elytes - strict I&O - Avoid nephrotoxic agents, renally dose medications - ensure adequate renal perfusion   Type 2 diabetes; HA1c 6.4 - cbg q4h  - SSI   HFpEF  - f/u echo  - hold lasix  in setting of shock, aki   HTN HLD - hold antihypertensives in setting of shock (home meds include hydralazine  - pend neuro/stroke rec on statin/asa coverage in setting of acute stroke  COPD - will add on triple therapy brovana , pulmicort , yupelri  to optimize lung function in setting of active pna and respiratory failure   Best Practice (right click and Reselect all SmartList Selections daily)   Diet/type: tubefeeds and NPO DVT prophylaxis: prophylactic heparin   Pressure ulcer(s): POA GI prophylaxis: H2B Lines: Central line, Arterial Line, and yes and it is still needed Foley:  Yes, and it is still needed Code Status:  full code Last date of multidisciplinary goals of care discussion [pending]  Labs   CBC: Recent Labs  Lab 06/27/23 0558 06/27/23 0838 06/28/23 0305 06/28/23 0306  WBC 13.0*  --  27.5*  --   NEUTROABS 11.0*  --   --   --   HGB 11.7* 12.6* 10.9* 11.2*  HCT 38.1* 37.0* 35.1* 33.0*  MCV 95.0  --  92.6  --   PLT 311  --  280  --     Basic Metabolic Panel: Recent Labs  Lab 06/27/23 0558 06/27/23 0838 06/27/23 1352 06/28/23 0305 06/28/23 0306   NA 138 137 136 133* 134*  K 5.8* 5.8* 4.3 5.7* 5.8*  CL 103  --  104 104  --   CO2 21*  --  21* 18*  --   GLUCOSE 106*  --  102* 139*  --   BUN 48*  --  50* 53*  --   CREATININE 2.60*  --  2.82* 2.62*  --   CALCIUM  8.7*  --  8.1* 8.1*  --   MG  --   --   --  1.5*  --   PHOS  --   --   --  5.3*  --    GFR: Estimated Creatinine Clearance: 34.3 mL/min (A) (by C-G formula based on SCr of 2.62 mg/dL (H)). Recent Labs  Lab 06/27/23 0558 06/27/23 0604 06/27/23 0813 06/27/23 0824 06/28/23 0305  PROCALCITON  --   --  1.65  --   --   WBC 13.0*  --   --   --  27.5*  LATICACIDVEN  --  1.8  --  0.9  --     Liver Function Tests:  Recent Labs  Lab 06/27/23 0558  AST 18  ALT 14  ALKPHOS 107  BILITOT 0.6  PROT 7.0  ALBUMIN  3.1*   No results for input(s): LIPASE, AMYLASE in the last 168 hours. Recent Labs  Lab 06/27/23 0925  AMMONIA 30    ABG    Component Value Date/Time   PHART 7.210 (L) 06/28/2023 0306   PCO2ART 51.2 (H) 06/28/2023 0306   PO2ART 91 06/28/2023 0306   HCO3 20.4 06/28/2023 0306   TCO2 22 06/28/2023 0306   ACIDBASEDEF 7.0 (H) 06/28/2023 0306   O2SAT 95 06/28/2023 0306     Coagulation Profile: Recent Labs  Lab 06/27/23 0558  INR 1.1    Cardiac Enzymes: No results for input(s): CKTOTAL, CKMB, CKMBINDEX, TROPONINI in the last 168 hours.  HbA1C: Hemoglobin A1C  Date/Time Value Ref Range Status  08/14/2016 12:00 AM 5.6  Final  01/18/2016 12:00 AM 5.5  Final   Hgb A1c MFr Bld  Date/Time Value Ref Range Status  06/27/2023 09:25 AM 6.4 (H) 4.8 - 5.6 % Final    Comment:    (NOTE) Pre diabetes:          5.7%-6.4%  Diabetes:              >6.4%  Glycemic control for   <7.0% adults with diabetes   12/03/2022 04:54 PM 6.9 (H) 4.8 - 5.6 % Final    Comment:    (NOTE) Pre diabetes:          5.7%-6.4%  Diabetes:              >6.4%  Glycemic control for   <7.0% adults with diabetes     CBG: Recent Labs  Lab 06/27/23 1129  06/27/23 1516 06/27/23 1925 06/27/23 2339 06/28/23 0320  GLUCAP 88 78 76 93 141*    Review of Systems:   As above   Past Medical History:  He,  has a past medical history of Acquired absence of left great toe (HCC), Acquired absence of right leg below knee (HCC), Acute osteomyelitis of calcaneum, right (HCC) (10/02/2016), Acute respiratory failure (HCC), Amputation stump infection (HCC) (08/12/2018), Anemia, Basal cell carcinoma, eyelid, Cancer (HCC), Candidiasis (08/12/2018), CHF (congestive heart failure) (HCC), Chronic back pain, Chronic kidney disease, Chronic multifocal osteomyelitis (HCC), Chronic pain syndrome, COPD (chronic obstructive pulmonary disease) (HCC), DDD (degenerative disc disease), lumbar, Depression, Diabetes mellitus without complication (HCC), Diabetic ulcer of toe of left foot (HCC) (10/02/2016), Difficult intubation, Difficulty in walking, not elsewhere classified, 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), Foot drop, bilateral, Foot drop, right (12/27/2015), GERD (gastroesophageal reflux disease), Herpesviral vesicular dermatitis, History of COVID-19 (03/15/2022), Hyperlipidemia, Hyperlipidemia, Hypertension, Insomnia, Malignant melanoma of other parts of face (HCC), Melanoma (HCC), MRSA bacteremia, Muscle weakness (generalized), Nasal congestion, Necrosis of toe (HCC) (07/08/2018), Neuromuscular dysfunction of bladder, Osteomyelitis (HCC), Osteomyelitis (HCC), Osteomyelitis (HCC), Other idiopathic peripheral autonomic neuropathy, Retention of urine, unspecified, Squamous cell carcinoma of skin, Stroke (HCC), Unsteadiness on feet, Urinary retention, Urinary retention, Urinary tract infection, Wears dentures, and Wears glasses.   Surgical History:   Past Surgical History:  Procedure Laterality Date   AMPUTATION Left 03/29/2017   Procedure: LEFT GREAT TOE AMPUTATION AT METATARSOPHALANGEAL JOINT;  Surgeon: Harden Jerona GAILS, MD;  Location: [facility] OR;  Service: Orthopedics;  Laterality: Left;    AMPUTATION Right 03/29/2017   Procedure: RIGHT BELOW KNEE AMPUTATION;  Surgeon: Harden Jerona GAILS, MD;  Location: [facility] OR;  Service: Orthopedics;  Laterality: Right;   AMPUTATION Left 06/06/2018   Procedure: LEFT 2ND TOE  AMPUTATION;  Surgeon: Harden Jerona GAILS, MD;  Location: [facility] OR;  Service: Orthopedics;  Laterality: Left;   AMPUTATION Right 11/19/2018   Procedure: AMPUTATION ABOVE KNEE;  Surgeon: Harden Jerona GAILS, MD;  Location: [facility] OR;  Service: Orthopedics;  Laterality: Right;   ANTERIOR CERVICAL CORPECTOMY N/A 11/25/2015   Procedure: ANTERIOR CERVICAL FIVE CORPECTOMY Cervical four - six fusion;  Surgeon: Gerldine Maizes, MD;  Location: [facility];  Service: Neurosurgery;  Laterality: N/A;  ANTERIOR CERVICAL FIVE CORPECTOMY Cervical four - six fusion   APPLICATION OF WOUND VAC Right 10/22/2018   Procedure: Application Of Wound Vac;  Surgeon: Harden Jerona GAILS, MD;  Location[facility] OR;  Service: Orthopedics;  Laterality: Right;   CYSTOSCOPY WITH BIOPSY N/A 05/01/2022   Procedure: CYSTOSCOPY WITH URETHRAL BIOPSY;  Surgeon: Elisabeth Valli BIRCH, MD;  Location: [facility] ORS;  Service: Urology;  Laterality: N/A;  1 HR   CYSTOSCOPY WITH RETROGRADE URETHROGRAM N/A 04/25/2018   Procedure: CYSTOSCOPY WITH RETROGRADE URETHROGRAM/ BALLOON DILATION;  Surgeon: Ottelin, Mark, MD;  Location: [facility] ORS;  Service: Urology;  Laterality: N/A;   CYSTOSCOPY WITH URETHRAL DILATATION N/A 05/01/2022   Procedure: BALLOON DILATION WITH  OPTILUME;  Surgeon: Elisabeth Valli BIRCH, MD;  Location: [facility] ORS;  Service: Urology;  Laterality: N/A;   LAPAROTOMY N/A 12/03/2022   Procedure: EXPLORATORY LAPAROTOMY  total abdominal colectomy with end ileostomy;  Surgeon: Kinsinger, Herlene Righter, MD;  Location: [facility] OR;  Service: General;  Laterality: N/A;   MULTIPLE TOOTH EXTRACTIONS     POSTERIOR CERVICAL FUSION/FORAMINOTOMY N/A 11/29/2015   Procedure: Cervical Three-Cervical Seven Posterior Cervical Laminectomy with Fusion;  Surgeon: Gerldine Maizes, MD;  Location:  [facility];  Service: Neurosurgery;  Laterality: N/A;  Cervical Three-Cervical Seven Posterior Cervical Laminectomy with Fusion   STUMP REVISION Right 10/22/2018   Procedure: REVISION RIGHT BELOW KNEE AMPUTATION;  Surgeon: Harden Jerona GAILS, MD;  Location: [facility] OR;  Service: Orthopedics;  Laterality: Right;   STUMP REVISION Right 12/05/2018   Procedure: Revision Right Above Knee Amputation;  Surgeon: Harden Jerona GAILS, MD;  Location: [facility] OR;  Service: Orthopedics;  Laterality: Right;   STUMP REVISION Right 05/29/2019   Procedure: REVISION RIGHT ABOVE KNEE AMPUTATION;  Surgeon: Harden Jerona GAILS, MD;  Location: [facility] OR;  Service: Orthopedics;  Laterality: Right;   STUMP REVISION Right 06/24/2019   Procedure: STUMP REVISION;  Surgeon: Harden Jerona GAILS, MD;  Location: [facility] OR;  Service: Orthopedics;  Laterality: Right;     Social History:   reports that he has been smoking cigarettes. He has never used smokeless tobacco. He reports that he does not drink alcohol  and does not use drugs.   Family History:  His family history includes Bone cancer in his father; Cancer in his father, mother, paternal grandfather, and another family member; Diabetes in his mother and another family member; Heart disease in his mother; Prostate cancer in his father.   Allergies Allergies  Allergen Reactions   Latex Other (See Comments)    ALLERGIC, per [facility]     Home Medications  Prior to Admission medications   Medication Sig Start Date End Date Taking? Authorizing Provider  acetaminophen  (TYLENOL ) 500 MG tablet Take 1,000 mg by mouth in the morning and at bedtime.   Yes [provider]  aspirin  EC 81 MG tablet Take 81 mg by mouth daily. Swallow whole.   Yes [provider]  atorvastatin  (LIPITOR ) 80 MG tablet Take 1 tablet (80 mg total) by mouth daily. Patient taking differently: Take 80 mg  by mouth at bedtime. 09/22/22 01/27/24 Yes Cindy Garnette POUR, MD  carvedilol  (COREG ) 12.5 MG tablet Take 1 tablet (12.5 mg  total) by mouth 2 (two) times daily with a meal. 12/25/22  Yes Ghimire, Renato, MD  Cholecalciferol  25 MCG (1000 UT) capsule Take 2,000 Units by mouth daily.   Yes [provider]  Cranberry 450 MG CAPS Take 450 mg by mouth daily.   Yes [provider]  cyclobenzaprine  (FLEXERIL ) 5 MG tablet Take 5 mg by mouth every 8 (eight) hours as needed for muscle spasms.   Yes [provider]  Dextran 70-Hypromellose (NATURAL BALANCE TEARS) 0.1-0.3 % SOLN Place 1 drop into both eyes every 4 (four) hours as needed (dry eyes).   Yes [provider]  escitalopram  (LEXAPRO ) 20 MG tablet Take 20 mg by mouth daily.   Yes [provider]  ferrous sulfate  325 (65 FE) MG tablet Take 1 tablet (325 mg total) by mouth 3 (three) times daily with meals. Patient taking differently: Take 325 mg by mouth daily. With meal. 01/23/23  Yes Bernard Drivers, MD  fluticasone  (FLONASE ) 50 MCG/ACT nasal spray Place 1 spray into both nostrils daily.   Yes [provider]  furosemide  (LASIX ) 20 MG tablet Take 20-40 mg by mouth See admin instructions. Take 40 mg by mouth in the morning and 20 mg by mouth in the afternoon   Yes [provider]  haloperidol  lactate (HALDOL ) 5 MG/ML injection Inject 1 mg into the muscle every 6 (six) hours as needed (agitation/psychosis).   Yes [provider]  hydrALAZINE  (APRESOLINE ) 10 MG tablet Take 1 tablet (10 mg total) by mouth every 8 (eight) hours. 12/25/22  Yes Raenelle Renato, MD  hydrOXYzine  (VISTARIL ) 50 MG capsule Take 50 mg by mouth at bedtime.   Yes [provider]  insulin  glargine (LANTUS  SOLOSTAR) 100 UNIT/ML Solostar Pen Inject 15 Units into the skin daily. Patient taking differently: Inject 15 Units into the skin at bedtime. 09/07/22  Yes Rai, Ripudeep K, MD  insulin  lispro (HUMALOG ) 100 UNIT/ML KwikPen Inject 0-10 Units into the skin See admin instructions. Inject 0-10 units into the skin three times a day and at  bedtime, PER SLIDING SCALE:  BGL 151-200 = 2 units 201-250 = 4 units 251-300 = 6 units 301-350 =  8 units 351-400 = 10 units Patient taking differently: Inject 0-10 Units into the skin See admin instructions. Inject 0-10 units into the skin before meals and at bedtime, PER SLIDING SCALE:  0-150 = 0 units 151-200 = 2 units 201-250 = 4 units 251-300 = 6 units 301-350 =  8 units 351-400 = 10 units 01/31/22  Yes Dahal, Chapman, MD  ipratropium-albuterol  (DUONEB) 0.5-2.5 (3) MG/3ML SOLN Take 3 mLs by nebulization every 6 (six) hours as needed (for shortness of breath).   Yes [provider]  LACTOBACILLUS PO Take 1 capsule by mouth in the morning and at bedtime.   Yes [provider]  lidocaine  (LIDODERM ) 5 % Place 2 patches onto the skin daily. Remove & Discard patch within 12 hours or as directed by MD Patient taking differently: Place 1 patch onto the skin in the morning and at bedtime. Take off after 12 hours 12/25/22  Yes Raenelle Renato, MD  loperamide  (IMODIUM ) 2 MG capsule Take 1 capsule (2 mg total) by mouth 2 (two) times daily. 12/25/22  Yes Raenelle Renato, MD  loratadine  (CLARITIN ) 10 MG tablet Take 10 mg by mouth in the morning.   Yes [provider]  magnesium  oxide (MAG-OX) 400 (240 Mg) MG tablet Take 400 mg by mouth in the morning, at noon, and at bedtime.   Yes [provider]  Melatonin 10 MG TABS Take 10 mg by mouth at bedtime.   Yes [provider]  Meth-Hyo-M Bl-Na Phos-Ph Sal (URO-MP) 118 MG CAPS Take 118 mg by mouth 2 (two) times daily.   Yes [provider]  Multiple Vitamin (MULTIVITAMIN WITH MINERALS) TABS tablet Take 1 tablet by mouth daily. 12/25/22  Yes Raenelle Coria, MD  naloxone  (NARCAN ) 4 MG/0.1ML LIQD nasal spray kit Place 0.4 mg into the nose as needed (opiate OD).   Yes [provider]  omeprazole (PRILOSEC) 20 MG capsule Take 20 mg by mouth at bedtime.   Yes [provider]  ondansetron  (ZOFRAN ) 4  MG tablet Take 4 mg by mouth every 8 (eight) hours as needed for nausea or vomiting.   Yes [provider]  oxybutynin  (DITROPAN -XL) 5 MG 24 hr tablet Take 5 mg by mouth in the morning and at bedtime.   Yes [provider]  oxyCODONE  (OXY IR/ROXICODONE ) 5 MG immediate release tablet Take 5 mg by mouth every 6 (six) hours as needed for moderate pain (pain score 4-6).   Yes [provider]  oxyCODONE  (OXYCONTIN ) 15 mg 12 hr tablet Take 15 mg by mouth in the morning, at noon, and at bedtime.   Yes [provider]  OXYGEN Inhale 2 L into the lungs as needed (to maintain sats above 90/SOB).   Yes [provider]  oxymetazoline  (AFRIN) 0.05 % nasal spray Place 1 spray into both nostrils 2 (two) times daily as needed for congestion.   Yes [provider]  pregabalin  (LYRICA ) 100 MG capsule Take 1 capsule (100 mg total) by mouth 2 (two) times daily. Patient taking differently: Take 100 mg by mouth 3 (three) times daily. 12/25/22 06/27/23 Yes Raenelle Coria, MD  pregabalin  (LYRICA ) 25 MG capsule Take 25 mg by mouth 3 (three) times daily.   Yes [provider]  QUEtiapine  (SEROQUEL ) 100 MG tablet Take 100 mg by mouth at bedtime.   Yes [provider]  senna (SENOKOT) 8.6 MG TABS tablet Take 2 tablets by mouth at bedtime.   Yes [provider]  sodium bicarbonate  650 MG tablet Take 650 mg by mouth 2 (two) times daily.   Yes [provider]  tamsulosin  (FLOMAX ) 0.4 MG CAPS capsule Take 2 capsules (0.8 mg total) by mouth daily. Patient taking differently: Take 0.4 mg by mouth daily. 02/09/23  Yes Lenard Calin, MD  tirzepatide [facility]) 2.5 MG/0.5ML Pen Inject 2.5 mg into the skin once a week. On Tuesdays   Yes [provider]  trolamine salicylate (ASPERCREME) 10 % cream Apply 1 Application topically in the morning, at noon, in the evening, and at bedtime. Right leg   Yes [provider]  UNABLE TO FIND Take  30 mLs by mouth in the morning and at bedtime. Med Name: liquid protein   Yes [provider]     Critical care time: 46    Tinnie FORBES Adolph DEVONNA [facility] 06/28/23 8:57 AM  Please see [email or web address] for pager details.  From 7A-7P if no response, please call [phone] After hours, please call ELink [phone]

## 2023-06-28 NOTE — Procedures (Signed)
 PROCEDURE SUMMARY:  Multiple attempts made for lumbar puncture under image-guidance by this provider as well as Dr. Marthann.  Attempted to access thecal sac at L2-L3, L3-L4, L4-L5, and L5-S1.    Unforunately no return of CSF.   EBL < 5mL  Patient returned to unit.   Kristoff Coonradt Sue-Ellen Sharronda Schweers [name] 06/28/2023 5:03 PM

## 2023-06-28 NOTE — Progress Notes (Signed)
 Attempted Echocardiogram, Patient is currently in a procedure. Will attempt later.

## 2023-06-28 NOTE — Progress Notes (Signed)
 Pt transported on vent from 2M15 to  Fluoroscopy and back without any complications. Pt placed in prone position for procedure pt tolerated well, suction catheter was able pass, SPO2 remained 100% through out. Pt was placed back supine post procedure, pt tolerated well.

## 2023-06-28 NOTE — Plan of Care (Addendum)
 Overnight imaging reviewed.  MRI brain shows infarctions in the right PCA territory-acute on chronic.  Also involving the right thalamus.  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  His fevers etc. can be explained by the severe UTI and RSV infection as well as bilateral significant pneumonia. At this time, I would recommend stroke risk factor workup and stroke team will follow but I would also recommend getting palliative team on board for goals of care conversation.  Discontinue meningitic/encephalitic coverage  Stroke team to follow  -- Eligio Lav, MD Neurologist [facility]

## 2023-06-28 NOTE — Progress Notes (Addendum)
 eLink Physician-Brief Progress Note Patient Name: Gregory [name] DOB: [birth year] MRN: [id number]   Date of Service  06/28/2023  HPI/Events of Note    eICU Interventions      Stool Cdiff negative  Was not positive at any point will dc enteric precautions  Addendum Shivering/Temp 37.8 Responded to demerol  last night  We will repeat   Addendum Meperidine  not recommended per Pharmacy/Epic  We will five Ofirmev     Gerrit Ahumada 06/28/2023, 8:42 PM

## 2023-06-28 NOTE — Procedures (Signed)
 Arterial Catheter Insertion Procedure Note  SATYA BUTTRAM  [id number]  1961/02/02  Date:06/28/23  Time:2:58 AM    Provider Performing: Corean CHRISTELLA Akeiba Axelson    Procedure: Insertion of Arterial Line (63379) with US  guidance (23062)    Indication(s): Blood pressure monitoring and/or need for frequent ABGs   Consent: Unable to obtain consent due to emergent nature of procedure.   Anesthesia: None   Time Out Verified patient identification, verified procedure, site/side was marked, verified correct patient position, special equipment/implants available, medications/allergies/relevant history reviewed, required imaging and test results available.   Sterile Technique: Maximal sterile technique including full sterile barrier drape, hand hygiene, sterile gown, sterile gloves, mask, hair covering, sterile ultrasound probe cover (if used).   Procedure Description: Area of catheter insertion was cleaned with chlorhexidine  and draped in sterile fashion. With real-time ultrasound guidance an arterial catheter was placed into the right radial artery.  Appropriate arterial tracings confirmed on monitor.     Complications/Tolerance: None; patient tolerated the procedure well.   EBL: Minimal   Specimen(s): None  Corean CHRISTELLA Amyri Frenz, [place] [facility] 06/28/23 2:59 AM  Please see [email or web address] for pager details.  From 7A-7P if no response, please call [phone] After hours, please call ELink [phone]

## 2023-06-28 NOTE — Progress Notes (Signed)
 SLP Cancellation Note  Patient Details Name: Gregory [name] MRN: [id number] DOB: 09/30/1960   Cancelled treatment:       Reason Eval/Treat Not Completed: Patient not medically ready. Will f/u for readiness for cognitive eval   Luisfernando Brightwell, Consuelo Fitch 06/28/2023, 7:23 AM

## 2023-06-28 NOTE — Procedures (Addendum)
 Patient Name: SHERMAINE BRIGHAM  MRN: [id number]  Epilepsy Attending: Arlin MALVA Krebs  Referring Physician/Provider: Judithe Rocky BROCKS, NP  Duration: 06/27/2023 2356 to 06/29/2023 0200  Patient history: 63yo M with ams getting eeg to evaluate for seizure  Level of alertness: comatose  AEDs during EEG study: Propofol   Technical aspects: This EEG study was done with scalp electrodes positioned according to the 10-20 International system of electrode placement. Electrical activity was reviewed with band pass filter of 1-70Hz , sensitivity of 7 uV/mm, display speed of 59mm/sec with a 60Hz  notched filter applied as appropriate. EEG data were recorded continuously and digitally stored.  Video monitoring was available and reviewed as appropriate.  Description: EEG showed continuous generalized 3 to 6 Hz theta-delta slowing. Hyperventilation and photic stimulation were not performed.     EEG was disconnected between 06/28/2023 1436 to 1639   ABNORMALITY - Continuous slow, generalized  IMPRESSION: This study is suggestive of severe diffuse encephalopathy. No seizures or epileptiform discharges were seen throughout the recording.  Aaron Bostwick O Zenya Hickam

## 2023-06-28 NOTE — Progress Notes (Signed)
 Pharmacy Antibiotic Note  Gregory [name] is a 63 y.o. male admitted on 06/27/2023 with possible meningitis.  Pharmacy has been consulted for acyclovir  dosing. He is also on rocephin  2gm IV q12h, ampicillin  2gm IV q8h and vancomycin   -Wt= 84> 96kg  -SCr= 2.82> 2.6 (baseline ~ 2.0), CrCl ~ 30   Plan: -Acyclovir  900mg  IV Q24hr  -Add normal saline at 50ml/hr for renal protection (discussed with MD) -Will follow renal function, cultures and clinical progress   Height: 6' 0.01 (182.9 cm) Weight: 96.4 kg (212 lb 8.4 oz) IBW/kg (Calculated) : 77.62  Temp (24hrs), Avg:99.3 F (37.4 C), Min:98.6 F (37 C), Max:100 F (37.8 C)  Recent Labs  Lab 06/27/23 0558 06/27/23 0604 06/27/23 0824 06/27/23 1352 06/28/23 0305  WBC 13.0*  --   --   --  27.5*  CREATININE 2.60*  --   --  2.82* 2.62*  LATICACIDVEN  --  1.8 0.9  --   --   VANCORANDOM  --   --   --   --  17    Estimated Creatinine Clearance: 34.3 mL/min (A) (by C-G formula based on SCr of 2.62 mg/dL (H)).    Allergies  Allergen Reactions   Latex Other (See Comments)    ALLERGIC, per MAR    Antimicrobials this admission: 1/9 rocephin  x1 1/9 ampicillin  >> 1/10 1/9 acyclovir  >> 1/9 metronidazole  x1 1/9 vancomycin >>  1/9 zosyn  >>  1/9 Azithromycin  >>  Dose adjustments this admission:   Microbiology results: 1/9 Bcx: ngtd 1/9 Urine Rk:HWMd 1/9 GI Panel:  1/10 Resp Cx: GNRs 1/9 RVP: Rhino/Enterovirus (+) MRSA (+) C diff (-)  Thank you for allowing pharmacy to be a part of this patient's care.  Prentice Poisson, PharmD Clinical Pharmacist **Pharmacist phone directory can now be found on [email or web address] ([email or web address]).  Listed under [facility].

## 2023-06-28 NOTE — Progress Notes (Signed)
 06/28/2023 Trop leak noted. Echo WNL. In context of everything else going on he is not a LHC candidate at any point in near future. This is almost certainly a type II phenomenon. No heparin  indicated. Continue ASA, statin Consider OP noninvasive ischemic eval if recovers from acute illness.  Rolan Sharps MD PCCM

## 2023-06-28 NOTE — Consult Note (Signed)
 Palliative Care Consult Note                                  Date: 06/28/2023   Patient Name: Gregory [name]  DOB: 1961-02-01  MRN: [id number]  Age / Sex: 63 y.o., male  PCP: [name], Provider, MD Referring Physician: Neda Jennet LABOR, MD  Reason for Consultation: Establishing goals of care  HPI/Patient Profile: 63 y.o. 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  He was intubated in the ED for airway protection.  Initial workup revealed elevated creatinine and mild leukocytosis.  CT head was abnormal with concern for acute infarct.  He is admitted with acute encephalopathy, right sided pneumonia, and sepsis.  Palliative Medicine is consulted for goals of care.   Subjective:   Extensive chart review has been completed prior to meeting with patient/family including labs, vital signs, imaging, progress/consult notes, orders, medications and available advance directive documents.   I met with patient's daughter/Gregory [name] to discuss diagnosis, prognosis, and GOC.  I introduced Palliative Medicine as specialized medical care for people living with serious illness.   Created space and opportunity for daughter to express thoughts and feelings regarding current medical situation. Values and goals of care were attempted to be elicited.  Social History: Patient is divorced. He has 2 daughters. 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 He has resided at [facility] since June 2017.   Functional Status: At baseline, patient is able to transfer himself from bed to wheelchair. He is able to wheel himself around the facility, and will go out to the courtyard to smoke.   GOC Discussion: We discussed patient's  current illness and what it means in the larger context of his ongoing co-morbidities. Current clinical status was reviewed. Natural disease trajectory of chronic illness was discussed.  Gregory [name] is an [employer] and is very familiar with her father's complex medical history and multiple hospitalizations.  She reports that unfortunately he has not taken care of himself for many years.   The difference between full scope medical intervention and comfort care was considered. Gregory [name] shares that patient had hospice services in the past, but mostly for pain management. She shares that he doesn't want to be be miserable, but he also doesn't want to die.   We discussed code status. Patient has previously stated if he were to die at the nursing home, to let him go. Reviewed evidenced based poor outcomes in similar hospitalized patients, as the cause of the arrest is likely associated with chronic/serious illness rather than a reversible acute cardio-pulmonary event. Gregory [name] understands all this, but also has concerns that there is bias associated with DNR status. At this time, she wants to allow him the opportunity for improvement as consistent with his previously stated wishes.   We reviewed patient's MOST form on file from October 2024.  It outlines the following treatment decisions: Attempt CPR, limited additional interventions, antibiotics if indicated, and IV fluids if indicated (box was not checked for preference on feeding tube). Discussed that DNR/no CPR and full scope of treatment may be more consistent with patient's previously stated wishes, and recommended completing a new MOST form prior to discharge.   Discussed  the importance of continued conversation with the medical team regarding overall plan of care and treatment options, ensuring decisions are within the context of the patients values and GOCs.    Review of Systems  Unable to perform ROS   Objective:   Primary Diagnoses: Present on  Admission:  Respiratory failure [facility])   Physical Exam Vitals reviewed.  Constitutional:      General: He is not in acute distress.    Comments: Critically ill-appearing  Pulmonary:     Comments: Intubated Musculoskeletal:     Right Lower Extremity: Right leg is amputated above knee.     Palliative Assessment/Data: PPS 30%     Assessment & Plan:   SUMMARY OF RECOMMENDATIONS   Continue full scope care Goal of care is continued workup and medical stabilization Ongoing palliative support  Primary Decision Maker: HCPOA - daughter Gregory [name]  Code Status/Advance Care Planning: Full code  Symptom Management:  Per attending Unclear what is his home/chronic pain regimen prior to admission. He was previously on Oxycontin  and/or oxycodone  IR, however drug screen on admission was negative for opioids. I was unable to locate med rec from SNF.   Prognosis:  Unable to determine  Discharge Planning:  To Be Determined    Thank you for allowing us  to participate in the care of Gregory [name]   Time Total: 75 minutes  Detailed review of medical records (labs, imaging, vital signs), medically appropriate exam, discussed with treatment team, counseling and education to patient, family, & staff, documenting clinical information, medication management, coordination of care.  Signed by: Recardo Loll, NP Palliative Medicine Team  Team Phone # [phone]  For individual providers, please see AMION

## 2023-06-28 NOTE — Progress Notes (Signed)
 eLink Physician-Brief Progress Note Patient Name: Gregory [name] DOB: 20-Feb-1961 MRN: [id number]   Date of Service  06/28/2023  HPI/Events of Note  63 year old man w/ hx of COPD, afib, CKD3b, mesenteric ischemia s/p colostomy (12/2022), T2DM, HFpEF, HTN, HLD presenting from SNF after being found obtunded . +RSV and grossly positive UA  Having shivering while cooling, RASS goal met  eICU Interventions  [email or web address]     Intervention Category Minor Interventions: Routine modifications to care plan (e.g. PRN medications for pain, fever)  Kaley Jutras 06/28/2023, 2:09 AM

## 2023-06-28 NOTE — Procedures (Signed)
 Procedure Note: Lumbar puncture - unsuccessful  Indications: assess for CNS pathology   Operator: Ary Cummins, MD, PhD  Others present: RN   Indications, risks, and benefits explained to patient / surrogate decision maker and informed consent obtained. Time-out was performed, with all individuals present agreeing on the procedure to be performed, the site of procedure, and the patient identity.  Patient positioned, prepped and draped in usual sterile fashion. L3-4 space approximately located using bilateral iliac crests as landmarks. However, difficulty with location of L3-4 space due to patient positioning. An 20G spinal needle was introduced into the presumed space. However, with several attempts, no CSF can be collected. Procedure aborted. Discussed with Dr. Neda and will proceed with fluoro. Order placed and spoke with fluoro RN.    Ary Cummins, MD PhD Stroke Neurology 06/28/2023 1:21 PM

## 2023-06-28 NOTE — Progress Notes (Addendum)
 Pharmacy Antibiotic Note  Gregory [name] is a 63 y.o. male admitted on 06/27/2023 with possible meningitis and pneumonia.  Pharmacy has been consulted for vancomycin  dosing.   Evidence of multifocal pneumonia on CXR. RVP detected rhinovirus/enterovirus. Tracheal aspirate with few GNRs. Meningitis coverage with ceftriaxone , ampicillin , and acyclovir  was added on 1/9. MRI shows infarctions in the right PCA territory, discontinuing meningitis coverage per neuro MD request.   -Vancomycin  random 17 mcg/mL at 0305 (~18 hours after loading dose), within goal trough range of 15-20 mcg/mL, appropriate to re-dose vancomycin  today.  -SCr= 2.62 (improving, baseline ~ 2.0), CrCl ~ 35 -WBC increase from 13.0 to 27.5, afebrile -Patient remains on norepinephrine , currently at 8 mcg/min  Plan: Vancomycin  1000 mg IV x 1 Repeat vancomycin  level in 24 hours Restart Zosyn  3.375 IV every 8 hours Continue Azithromycin  500 mg IV every 24 hours Stop acyclovir , ceftriaxone , ampicillin , and metronidazole  Monitor renal function and clinical progression De-escalate antibiotics as able  Height: 6' 0.01 (182.9 cm) Weight: 96.4 kg (212 lb 8.4 oz) IBW/kg (Calculated) : 77.62  Temp (24hrs), Avg:101.9 F (38.8 C), Min:98.6 F (37 C), Max:104.9 F (40.5 C)  Recent Labs  Lab 06/27/23 0558 06/27/23 0604 06/27/23 0824 06/27/23 1352 06/28/23 0305  WBC 13.0*  --   --   --  27.5*  CREATININE 2.60*  --   --  2.82* 2.62*  LATICACIDVEN  --  1.8 0.9  --   --   VANCORANDOM  --   --   --   --  17    Estimated Creatinine Clearance: 34.3 mL/min (A) (by C-G formula based on SCr of 2.62 mg/dL (H)).    Allergies  Allergen Reactions   Latex Other (See Comments)    ALLERGIC, per MAR    Antimicrobials this admission: 1/9 rocephin  x1 1/9 ampicillin  >> 1/10 1/9 acyclovir  x1 1/9 metronidazole  x1 1/9 vancomycin >>  1/9 zosyn  >>  1/9 Azithromycin  >>  Microbiology results: 1/9 MRSA PCR- positive 1/9 RVP:  Rhinovirus/Enterovirus (+) 1/9 urine: >100K GNRs 1/9 blood x2: ngtd 1/10 Respiratory Cx: GNRs C. Diff PCR negative  Thank you for allowing pharmacy to be a part of this patient's care.  Morna Breach, PharmD PGY-1 Acute Care Pharmacy Resident 06/28/2023 9:46 AM

## 2023-06-28 NOTE — Progress Notes (Signed)
 ET tube holder placed to secure tube, per CCM

## 2023-06-28 NOTE — Plan of Care (Signed)
 I discussed with Daughter [name] over the phone. Pt still has high grade fever, leukocytosis, sepsis and septic shock. MRI without contrast did not support HSV encephalitis, and LTM EEG no seizure, however, he is altered mental status, with nuchal rigidity (moderate), to rule out CNS infection, LP still need to be considered. [name] stated that pt last time admission in 6-12/2022 for sepsis and septic shock due to mesenteric ischemia indicated that he wanted to live and did not want to die. He was full code at that time and recovered relatively well. He was in SNF prior to this admission, wheelchair bound, but mentally intact, happy, conversing well. [name] felt that pt would want full code at this time. Palliative care has contacted her and she is a [employer] and she knows the situation and would consider differently if things not going well.   Therefore, she agreed with LP and had verbal consent over the phone about LP. I had Dr. Neda as witness and talked to her too.   Ary Cummins, MD PhD Stroke Neurology 06/28/2023 11:46 AM

## 2023-06-28 NOTE — Progress Notes (Signed)
 MB performed maintenance on C3. C4, FP1 and F3 electrodes. All impedances are below 10k ohms. No skin breakdown noted.

## 2023-06-28 NOTE — Progress Notes (Signed)
 Echocardiogram 2D Echocardiogram has been performed.  Lucendia Herrlich 06/28/2023, 2:21 PM

## 2023-06-28 NOTE — Consult Note (Signed)
 WOC Nurse ostomy consult note Stoma type/location: Colostomy on the right side made in 12/2022 Stomal assessment/size: 2in Peristomal assessment: intact Treatment options for stomal/peristomal skin:  Output aprox. 60 ml: black/gray stools. Ostomy pouching: 2pc.  Education provided: Pt is not able to provides teaching. It is a old ostomy. Change every 3 days or if is leaking.  Order supplies: Bag: Lawson# [phone] Barrier Lawson# [id number]  Enrolled patient in [employer] DC program: No   WOC team will not plan to follow further.  Please reconsult if the ostomy is leaking more than 1 time per day for further assistance is needed. Thank-you,  Lela Holm BSN, RN, [employer], WOC  (Pager: [phone])

## 2023-06-28 NOTE — Progress Notes (Signed)
 LTM EEG hooked up and running - no initial skin breakdown - push button tested - [facility] monitoring. Patient had MRI prior to hook-up regular leads used.

## 2023-06-28 NOTE — TOC Initial Note (Addendum)
 Transition of Care [facility]) - Initial/Assessment Note    Patient Details  Name: Gregory [name] MRN: [id number] Date of Birth: 09/23/1960  [facility] CM/SW Contact:    Lauraine FORBES Saa, LCSW Phone Number: 06/28/2023, 2:04 PM   Clinical Narrative:  2:05 PM Per chart review, patient is currently intubated and admitted from [facility]. CSW attempted to call patient's daughter, [name], regarding possible return to [facility]. There was no response and a voicemail was left.  2:20 PM Patient's daughter, [name], called CSW and confirmed that patient is to return to [facility] upon discharge.   Transition of Care Asessment: Insurance and Status: Insurance coverage has been reviewed Patient has primary care physician: No Home environment has been reviewed: SNF   Prior/Current Home Services: No current home services Social Drivers of Health Review:  (Needs updated SDOH) Readmission risk has been reviewed: Yes Transition of care needs: transition of care needs identified, TOC will continue to follow   Admission diagnosis:  Respiratory failure (HCC) [J96.90] Acute respiratory failure with hypoxia (HCC) [J96.01] Encephalopathy, unspecified type [G93.40] Patient Active Problem List   Diagnosis Date Noted   Fever 06/28/2023   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   Altered mental status 02/08/2023   Abscess of abdominal cavity (HCC) 02/08/2023   Protein-calorie malnutrition, severe 12/19/2022   Occipital stroke (HCC) 11/27/2022   Skin ulcer of left ankle, limited to breakdown of skin (HCC) 09/19/2022   Stroke-like symptom 09/18/2022   Diabetic ulcer of lower leg (HCC) 09/18/2022   Constipation 09/18/2022   Shortness of breath 09/07/2022   Symptomatic anemia 09/05/2022   Chronic hypoxic respiratory failure (HCC) 09/05/2022   Respiratory failure (HCC) 08/22/2022   Pressure injury of skin 08/22/2022   Influenza A 08/22/2022   Sepsis (HCC) 08/22/2022   Pneumonia  05/01/2022   Right upper lobe pneumonia 09/05/2021   Hyponatremia 09/05/2021   Anxiety 09/05/2021   BPH (benign prostatic hyperplasia) 09/05/2021   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   Asymptomatic bacteriuria 06/30/2021   Normocytic anemia 06/29/2021   Pain of amputation stump of right lower extremity (HCC) 03/15/2021   Melanoma of cheek (HCC) 10/15/2019   Acute hypoxic respiratory failure (HCC) 07/11/2019   Acute respiratory disease due to COVID-19 virus 07/11/2019   Wound infection 06/22/2019   Abscess 06/22/2019   Chronic kidney disease, stage 3b (HCC) 05/23/2019   Tobacco abuse 05/23/2019   Cellulitis 05/23/2019   S/P AKA (above knee amputation) unilateral, right (HCC) 12/05/2018   Amputation stump infection (HCC)    Lymphedema of left leg    Diabetic polyneuropathy associated with type 2 diabetes mellitus (HCC)    Above knee amputation of right lower extremity (HCC) 10/22/2018   Dehiscence of amputation stump (HCC)    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   Candidiasis 08/12/2018   Necrosis of toe (HCC) 07/08/2018   Amputated toe of left foot (HCC) 06/18/2018   Streptococcal infection    Pressure injury, stage 3 (HCC) 06/06/2018   Cutaneous abscess of left foot    Moderate protein-calorie malnutrition (HCC)    Cellulitis of left lower extremity    Septic arthritis (HCC) 06/03/2018   Idiopathic chronic venous hypertension of left lower extremity with ulcer and inflammation (HCC) 11/14/2017   Great toe amputation status, left 11/14/2017   Enlarged lymph node in neck 07/18/2017   Malignant melanoma of face (HCC) 04/16/2017   Osteomyelitis (HCC) 03/25/2017   Skin cancer 03/25/2017   Depression 11/05/2016  Diabetic ulcer of toe of left foot (HCC) 10/02/2016   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   Chronic pain 09/27/2016   DM2 (diabetes mellitus, type 2) (HCC) 06/06/2016   GERD without esophagitis 05/11/2016   Hypertensive heart disease with congestive  heart failure (HCC) 12/15/2015   Spinal stenosis in cervical region 12/15/2015   Type II diabetes mellitus with neurological manifestations (HCC) 12/15/2015   Chronic diastolic HF (heart failure) (HCC)    Pulmonary hypertension (HCC)    HTN (hypertension)    HLD (hyperlipidemia)    Urinary retention    Diskitis    Foot drop, bilateral    Sepsis with acute renal failure without septic shock (HCC)    COPD (chronic obstructive pulmonary disease) (HCC) 10/03/2015   Acute osteomyelitis of left foot (HCC) 10/03/2015   MRSA bacteremia 08/13/2015   Chronic low back pain 08/13/2015   Chronic multifocal osteomyelitis, multiple sites (HCC) 08/13/2015   Community acquired pneumonia 08/13/2015   Encephalopathy 03/25/2012   PCP:  [name], Provider, MD Pharmacy:  No Pharmacies Listed    Social Drivers of Health (SDOH) Social History: SDOH Screenings   Food Insecurity: No Food Insecurity (02/08/2023)  Housing: Low Risk  (02/08/2023)  Transportation Needs: No Transportation Needs (02/08/2023)  Utilities: Not At Risk (02/08/2023)  Depression (PHQ2-9): Low Risk  (03/08/2021)  Tobacco Use: High Risk (05/28/2023)   Received from [facility]   SDOH Interventions:     Readmission Risk Interventions    09/06/2022    1:48 PM 08/27/2022   12:46 PM 05/03/2022   11:53 AM  Readmission Risk Prevention Plan  Transportation Screening Complete Complete Complete  Medication Review [employer]) Complete Complete Complete  PCP or Specialist appointment within 3-5 days of discharge Complete Complete Complete  HRI or Home Care Consult Complete Complete Complete  SW Recovery Care/Counseling Consult Complete Complete Complete  Palliative Care Screening Not Applicable Not Applicable Not Applicable  Skilled Nursing Facility Complete Complete Complete

## 2023-06-28 NOTE — Progress Notes (Signed)
 STROKE TEAM PROGRESS NOTE   SUBJECTIVE (INTERVAL HISTORY) His RN is at the bedside. Pt still intubated on pressor and sedation. Not open eyes and not following commands. On LTM EEG. And MRI showed right PCA acute on chronic infarct. CCM has de-escalate his Abx, now on zosyn , vanco and azithromycin .    OBJECTIVE Temp:  [98.6 F (37 C)-104.9 F (40.5 C)] 98.9 F (37.2 C) (01/10 0734) Pulse Rate:  [78-222] 81 (01/10 0800) Cardiac Rhythm: Normal sinus rhythm (01/09 2000) Resp:  [16-27] 22 (01/10 0800) BP: (63-138)/(27-114) 101/58 (01/10 0800) SpO2:  [83 %-100 %] 98 % (01/10 0800) Arterial Line BP: (117-134)/(41-53) 132/53 (01/10 1000) FiO2 (%):  [60 %-100 %] 60 % (01/10 0800) Weight:  [96.4 kg] 96.4 kg (01/10 0500)  Recent Labs  Lab 06/27/23 1516 06/27/23 1925 06/27/23 2339 06/28/23 0320 06/28/23 0737  GLUCAP 78 76 93 141* 105*   Recent Labs  Lab 06/27/23 0558 06/27/23 0838 06/27/23 1352 06/28/23 0305 06/28/23 0306 06/28/23 0803  NA 138 137 136 133* 134* 135  K 5.8* 5.8* 4.3 5.7* 5.8* 5.1  CL 103  --  104 104  --   --   CO2 21*  --  21* 18*  --   --   GLUCOSE 106*  --  102* 139*  --   --   BUN 48*  --  50* 53*  --   --   CREATININE 2.60*  --  2.82* 2.62*  --   --   CALCIUM  8.7*  --  8.1* 8.1*  --   --   MG  --   --   --  1.5*  --   --   PHOS  --   --   --  5.3*  --   --    Recent Labs  Lab 06/27/23 0558  AST 18  ALT 14  ALKPHOS 107  BILITOT 0.6  PROT 7.0  ALBUMIN  3.1*   Recent Labs  Lab 06/27/23 0558 06/27/23 0838 06/28/23 0305 06/28/23 0306 06/28/23 0803  WBC 13.0*  --  27.5*  --   --   NEUTROABS 11.0*  --   --   --   --   HGB 11.7* 12.6* 10.9* 11.2* 10.5*  HCT 38.1* 37.0* 35.1* 33.0* 31.0*  MCV 95.0  --  92.6  --   --   PLT 311  --  280  --   --    No results for input(s): CKTOTAL, CKMB, CKMBINDEX, TROPONINI in the last 168 hours. Recent Labs    06/27/23 0558  LABPROT 14.0  INR 1.1   Recent Labs    06/27/23 0659 06/27/23 1411   COLORURINE YELLOW GREEN*  LABSPEC 1.006 TEST NOT REPORTED DUE TO COLOR INTERFERENCE OF URINE PIGMENT  PHURINE 6.0 TEST NOT REPORTED DUE TO COLOR INTERFERENCE OF URINE PIGMENT  GLUCOSEU NEGATIVE TEST NOT REPORTED DUE TO COLOR INTERFERENCE OF URINE PIGMENT*  HGBUR SMALL* TEST NOT REPORTED DUE TO COLOR INTERFERENCE OF URINE PIGMENT*  BILIRUBINUR NEGATIVE TEST NOT REPORTED DUE TO COLOR INTERFERENCE OF URINE PIGMENT*  KETONESUR NEGATIVE TEST NOT REPORTED DUE TO COLOR INTERFERENCE OF URINE PIGMENT*  PROTEINUR 30* TEST NOT REPORTED DUE TO COLOR INTERFERENCE OF URINE PIGMENT*  NITRITE NEGATIVE TEST NOT REPORTED DUE TO COLOR INTERFERENCE OF URINE PIGMENT*  LEUKOCYTESUR LARGE* TEST NOT REPORTED DUE TO COLOR INTERFERENCE OF URINE PIGMENT*       Component Value Date/Time   CHOL 92 11/17/2018 0406   TRIG 377 (H) 06/28/2023 0305  HDL 19 (L) 11/17/2018 0406   CHOLHDL 4.8 11/17/2018 0406   VLDL 55 (H) 11/17/2018 0406   LDLCALC 18 11/17/2018 0406   Lab Results  Component Value Date   HGBA1C 6.4 (H) 06/27/2023      Component Value Date/Time   LABOPIA NONE DETECTED 06/27/2023 0748   COCAINSCRNUR NONE DETECTED 06/27/2023 0748   LABBENZ NONE DETECTED 06/27/2023 0748   AMPHETMU NONE DETECTED 06/27/2023 0748   THCU NONE DETECTED 06/27/2023 0748   LABBARB NONE DETECTED 06/27/2023 0748    No results for input(s): ETH in the last 168 hours.  I have personally reviewed the radiological images below and agree with the radiology interpretations.  Overnight EEG with video Result Date: 06/28/2023 [place] Arlin KIDD, MD     06/28/2023  9:50 AM Patient Name: ABDELAZIZ WESTENBERGER MRN: [id number] Epilepsy Attending: Arlin KIDD Shelton Referring Physician/Provider: Judithe Rocky BROCKS, NP Duration: 06/27/2023 2356 to 06/28/2023 0945 Patient history: 63yo M with ams getting eeg to evaluate for seizure Level of alertness: comatose AEDs during EEG study: Propofol  Technical aspects: This EEG study was done with scalp electrodes  positioned according to the 10-20 International system of electrode placement. Electrical activity was reviewed with band pass filter of 1-70Hz , sensitivity of 7 uV/mm, display speed of 77mm/sec with a 60Hz  notched filter applied as appropriate. EEG data were recorded continuously and digitally stored.  Video monitoring was available and reviewed as appropriate. Description: EEG showed continuous generalized 3 to 6 Hz theta-delta slowing. Hyperventilation and photic stimulation were not performed.   ABNORMALITY - Continuous slow, generalized IMPRESSION: This study is suggestive of severe diffuse encephalopathy. No seizures or epileptiform discharges were seen throughout the recording. Arlin KIDD Shelton   DG Chest Port 1 View Result Date: 06/28/2023 CLINICAL DATA:  [id number]. Encounter for pneumonia. Ventilator dependent respiratory failure. EXAM: PORTABLE CHEST 1 VIEW COMPARISON:  Chest CT without contrast yesterday at 7:51 p.m. FINDINGS: 3:56 a.m. ETT tip is 5 cm from the carina, NGT curves around to the left in the stomach with the tip in the upper medial lumen. Right IJ central line tip at the superior cavoatrial junction. Widespread right-greater-than-left airspace disease is again noted, with relative sparing in the left upper lung field. No pleural effusion is seen. There is mild cardiomegaly, mild central vascular prominence. Stable mediastinum. There is aortic atherosclerosis.  No acute or new osseous findings. IMPRESSION: 1. Widespread right-greater-than-left airspace disease, with relative sparing in the left upper lung field. No new or worsening infiltrate. 2. Mild cardiomegaly with mild central vascular prominence. 3. Support apparatus as above. 4. Aortic atherosclerosis. Electronically Signed   By: Francis Quam M.D.   On: 06/28/2023 05:58   MR BRAIN WO CONTRAST Result Date: 06/27/2023 CLINICAL DATA:  Initial evaluation for acute neuro deficit, stroke suspected. EXAM: MRI HEAD WITHOUT CONTRAST  TECHNIQUE: Multiplanar, multiecho pulse sequences of the brain and surrounding structures were obtained without intravenous contrast. COMPARISON:  Prior study from earlier the same day. FINDINGS: Brain: Age-related cerebral atrophy. Patchy and confluent T2/FLAIR hyperintensity involving the periventricular deep white matter both cerebral hemispheres, consistent with chronic small vessel ischemic disease, moderately advanced. Patchy involvement of the pons. Encephalomalacia and gliosis involving the right occipital lobe, consistent with a chronic left PCA distribution infarct. Associated chronic hemosiderin staining within this region. Superimposed restricted diffusion within the right temporal occipital region, consistent with an acute on chronic right PCA distribution infarct. Patchy involvement of the right thalamus. No associated hemorrhage or significant regional mass effect. No other  evidence for acute or subacute ischemia. No acute intracranial hemorrhage. Chronic microhemorrhage at the anterior right temporal lobe noted. No mass lesion, midline shift or mass effect. No hydrocephalus or extra-axial fluid collection. Pituitary gland suprasellar region within normal limits. Vascular: Major intracranial vascular flow voids are maintained. Skull and upper cervical spine: Cranial junctional is. Postoperative changes partially visualize within the cervical spine. Bone marrow signal intensity normal. No scalp soft tissue abnormality. Sinuses/Orbits: Globes orbital soft tissues within normal limits. Mucosal thickening noted about the sphenoid ethmoidal and maxillary sinuses. Superimposed air-fluid level within the right maxillary sinus. Small bilateral mastoid effusions. Patient is intubated. Other: None. IMPRESSION: 1. 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 No associated hemorrhage or significant regional mass effect. 2. Underlying age-related cerebral atrophy  with moderate chronic microvascular ischemic disease. Electronically Signed   By: Morene Hoard M.D.   On: 06/27/2023 21:28   CT CHEST ABDOMEN PELVIS WO CONTRAST Result Date: 06/27/2023 CLINICAL DATA:  Pneumonia, sepsis EXAM: CT CHEST, ABDOMEN AND PELVIS WITHOUT CONTRAST TECHNIQUE: Multidetector CT imaging of the chest, abdomen and pelvis was performed following the standard protocol without IV contrast. RADIATION DOSE REDUCTION: This exam was performed according to the departmental dose-optimization program which includes automated exposure control, adjustment of the mA and/or kV according to patient size and/or use of iterative reconstruction technique. COMPARISON:  CT abdomen and pelvis 02/08/2023. FINDINGS: CT CHEST FINDINGS Cardiovascular: Heart is normal size. Aorta is normal caliber. Moderate coronary artery and aortic calcifications. Mediastinum/Nodes: No mediastinal, hilar, or axillary adenopathy. Trachea and esophagus are unremarkable. Endotracheal tube tip in the midtrachea. Thyroid unremarkable. Lungs/Pleura: Dense consolidation in both lower lobes compatible with pneumonia. No effusions. Patchy ground-glass opacities in the upper lobes as well. Musculoskeletal: Chest wall soft tissues are unremarkable. No acute bony abnormality. CT ABDOMEN PELVIS FINDINGS Hepatobiliary: Small layering gallstones within the gallbladder. No focal hepatic abnormality or biliary ductal dilatation. Pancreas: No focal abnormality or ductal dilatation. Spleen: No focal abnormality.  Normal size. Adrenals/Urinary Tract: Foley catheter in the bladder which is decompressed. No renal or adrenal mass. No stones or hydronephrosis. Stomach/Bowel: Prior colectomy. Right lower quadrant ostomy noted. No bowel obstruction. NG tube is in the stomach. Vascular/Lymphatic: Aortic atherosclerosis. No evidence of aneurysm or adenopathy. Reproductive: Mildly prominent prostate. Other: No free fluid or free air. Musculoskeletal: No  acute bony abnormality. IMPRESSION: Dense consolidation in both lower lobes with patchy opacities in the upper lobes. Findings compatible with multifocal pneumonia. Coronary artery disease, aortic atherosclerosis. Right lower quadrant ostomy.  No evidence of bowel obstruction. Cholelithiasis.  No CT evidence for acute cholecystitis. Electronically Signed   By: Franky Crease M.D.   On: 06/27/2023 20:01   DG CHEST PORT 1 VIEW Result Date: 06/27/2023 CLINICAL DATA:  Central line placement. EXAM: PORTABLE CHEST 1 VIEW COMPARISON:  Same day. FINDINGS: Endotracheal and nasogastric tubes are unchanged. Interval placement of right internal jugular catheter with distal tip in expected position of the SVC. No pneumothorax is noted. Stable diffuse right lung interstitial opacity is noted concerning for asymmetric edema or atypical inflammation. IMPRESSION: Interval placement of right internal jugular catheter with distal tip in expected position of the SVC. Electronically Signed   By: Lynwood Landy Raddle M.D.   On: 06/27/2023 12:40   CT HEAD WO CONTRAST ( ) Result Date: 06/27/2023 CLINICAL DATA:  Altered mental status. EXAM: CT HEAD WITHOUT CONTRAST TECHNIQUE: Contiguous axial images were obtained from the base of the skull through the vertex without  intravenous contrast. RADIATION DOSE REDUCTION: This exam was performed according to the departmental dose-optimization program which includes automated exposure control, adjustment of the mA and/or kV according to patient size and/or use of iterative reconstruction technique. COMPARISON:  CT head dated February 08, 2023. FINDINGS: Brain: New loss of the normal gray-white matter differentiation along the inferior right temporal lobe (series 3, image 14; series 5, images 35-42). No evidence of hemorrhage, hydrocephalus, or extra-axial collection. Old right occipital lobe infarct again noted. Vascular: Calcified atherosclerosis at the skull base. No hyperdense vessel. Skull: Normal.  Negative for fracture or focal lesion. Sinuses/Orbits: No acute finding. Other: Similar postsurgical changes right pre maxillary soft tissues. IMPRESSION: 1. New loss of the normal gray-white matter differentiation along the inferior right temporal lobe, concerning for acute infarct. No hemorrhage. Consider further evaluation with MRI. 2. Old right occipital lobe infarct. Electronically Signed   By: Elsie ONEIDA Shoulder M.D.   On: 06/27/2023 11:00   DG Chest Port 1 View Result Date: 06/27/2023 CLINICAL DATA:  Ventilator dependent respiratory failure. [id number] encounter for orogastric tube placement. EXAM: PORTABLE ABDOMEN - 1 VIEW; PORTABLE CHEST - 1 VIEW COMPARISON:  Portable chest today at 6:10 a.m., abdomen and pelvis CT 02/08/2023 FINDINGS: Chest AP portable 6:48 a.m.: Interval intubation with tip of the ETT 4.6 cm from carina. NGT interval insertion. The heart is enlarged. There is worsening central vascular congestion and perihilar edema. There is increased right-greater-than-left perihilar interstitial Dyane most likely due to ground-glass edema. The more peripheral lungs are essentially clear. Small pleural effusions are forming. The mediastinum is stable.  No new osseous abnormality. Flat plate single anteverted view, portable at 6:51 a.m.: NGT curves to the left and cephalad within the stomach with the tip along the proximal fundus. Compared with CT 02/08/2023 there is severe dilatation of the stomach with air and food products. Correlate clinically for gastric outlet obstruction or impaired gastric emptying. The bowel pattern appears nonobstructive. There is no supine evidence of free air. No other significant radiographic findings. IMPRESSION: 1. Interval intubation with tip of the ETT 4.6 cm from carina. 2. NGT curves to the left and cephalad within the stomach with the tip along the proximal fundus. 3. Compared with CT 02/08/2023 there is severe dilatation of the stomach with air and food products.  Correlate clinically for gastric outlet obstruction or impaired gastric emptying. 4. Worsening central vascular congestion and perihilar edema. 5. Developing small pleural effusions. Electronically Signed   By: Francis Quam M.D.   On: 06/27/2023 07:27   DG Abd Portable 1V Result Date: 06/27/2023 CLINICAL DATA:  Ventilator dependent respiratory failure. [id number] encounter for orogastric tube placement. EXAM: PORTABLE ABDOMEN - 1 VIEW; PORTABLE CHEST - 1 VIEW COMPARISON:  Portable chest today at 6:10 a.m., abdomen and pelvis CT 02/08/2023 FINDINGS: Chest AP portable 6:48 a.m.: Interval intubation with tip of the ETT 4.6 cm from carina. NGT interval insertion. The heart is enlarged. There is worsening central vascular congestion and perihilar edema. There is increased right-greater-than-left perihilar interstitial Dyane most likely due to ground-glass edema. The more peripheral lungs are essentially clear. Small pleural effusions are forming. The mediastinum is stable.  No new osseous abnormality. Flat plate single anteverted view, portable at 6:51 a.m.: NGT curves to the left and cephalad within the stomach with the tip along the proximal fundus. Compared with CT 02/08/2023 there is severe dilatation of the stomach with air and food products. Correlate clinically for gastric outlet obstruction or impaired gastric emptying. The bowel pattern  appears nonobstructive. There is no supine evidence of free air. No other significant radiographic findings. IMPRESSION: 1. Interval intubation with tip of the ETT 4.6 cm from carina. 2. NGT curves to the left and cephalad within the stomach with the tip along the proximal fundus. 3. Compared with CT 02/08/2023 there is severe dilatation of the stomach with air and food products. Correlate clinically for gastric outlet obstruction or impaired gastric emptying. 4. Worsening central vascular congestion and perihilar edema. 5. Developing small pleural effusions. Electronically  Signed   By: Francis Quam M.D.   On: 06/27/2023 07:27   DG Chest Port 1 View Result Date: 06/27/2023 CLINICAL DATA:  Unresponsive. EXAM: PORTABLE CHEST 1 VIEW COMPARISON:  02/08/2023 FINDINGS: The cardio pericardial silhouette is enlarged. Vascular congestion diffuse noted with diffuse interstitial opacity suggesting edema. No focal consolidation. No pneumothorax or pleural effusion. Gaseous distention of stomach or bowel is seen in the the left hemidiaphragm. Telemetry leads overlie the chest. IMPRESSION: Enlargement of the cardiopericardial silhouette with vascular congestion and diffuse interstitial opacity suggesting edema. Gaseous distention of stomach or bowel noted under the left hemidiaphragm. Electronically Signed   By: Camellia Candle M.D.   On: 06/27/2023 06:32     PHYSICAL EXAM  Temp:  [98.6 F (37 C)-104.9 F (40.5 C)] 98.9 F (37.2 C) (01/10 0734) Pulse Rate:  [78-222] 81 (01/10 0800) Resp:  [16-27] 22 (01/10 0800) BP: (63-138)/(27-114) 101/58 (01/10 0800) SpO2:  [83 %-100 %] 98 % (01/10 0800) Arterial Line BP: (117-134)/(41-53) 132/53 (01/10 1000) FiO2 (%):  [60 %-100 %] 60 % (01/10 0800) Weight:  [96.4 kg] 96.4 kg (01/10 0500)  General - Well nourished, well developed, intubated on sedation.  Ophthalmologic - fundi not visualized due to noncooperation.  Cardiovascular - Regular rate and tachycardia.  Neuro - intubated on sedation, eyes closed, not following commands. With forced eye opening, eyes in mid position, not blinking to visual threat, doll's eyes absent, not tracking, PERRL. Corneal reflex positive on the left but absent on the right, gag and cough present. Breathing over the vent.  Facial symmetry not able to test due to ET tube.  Tongue protrusion not cooperative. On pain stimulation, BUE slight withdraw, LLE no significant movement, redness on the LLE skin. RLE AKA with high thump level, no movement with pain stimulation. Sensation, coordination and gait not  tested.   ASSESSMENT/PLAN Mr. SALIM FORERO is a 63 y.o. male with history of DM, HTN, HLD, left eye blind, right occipital infarct with left HH, mesenteric ischemia s/p colostomy, RLE AKA, wheelchair bound living in SNF admitted for AMS, obtundation. He was intubated for nausea vomiting and not able to protect airway. Found to have high grade fever and in septic shock. No TNK given due to time onset unclear.    Stroke:  acute on chronic right PCA infarct, secondary to chronic right PCA occlusion in the setting of septic shock CT head New loss of the normal gray-white matter differentiation along the inferior right temporal lobe, concerning for acute infarct. Old right occipital lobe infarct.  MRI  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 involving the right temporal occipital region. MRA  pending Carotid Doppler  pending 2D Echo  pending LDL pending HgbA1c 6.4 UDS neg Heparin  subq for VTE prophylaxis aspirin  81 mg daily prior to admission, now on aspirin  81 mg daily.  Ongoing aggressive stroke risk factor management Therapy recommendations:  pending Disposition:  discussed with daughter, full code for now, palliative care on board  Sepsis/septic shock B/l Penumonia  UTI CT chest Dense consolidation in both lower lobes with patchy opacities in the upper lobes. Findings compatible with multifocal pneumonia. UA WBC > 50 On zosyn , vanco and azithromycin   Need to rule out meningitis/encephalitis LP failed at the bedside.  Will need LP under IR - ordered On pressor support CCM on board  Respiratory failure Intubated on vent On sedation and pressor support Vent management per CCM  Hx of stroke 09/2022 subacute right occipital infarct due to right PCA occlusion with severe vasculopathy. MRA occlusion of the distal right P1/proximal right P2 segment, without evidence of reconstitution of the right PCA branches. No DVT, EF 55-60%, A1C 6.9 and THC positive in urine. Put on ASA 81  but no plavix due to GIB and chronic anemia. Also on statin and tricor   [facility] at [facility] in 11/[facility]22  Diabetes HgbA1c 6.4 goal < 7.0 Controlled CBG monitoring SSI DM education and close PCP follow up  Hx of hypertension hypotension Low BP on presentation consistent with septic shock On pressure now Stable with pressure Stroke likely due to hypotension in the setting of right PCA occlusion Long term BP goal normotensive  Hyperlipidemia Home meds:  lipitor  80  LDL pending, goal < 70 Now on lipitor  80 Continue statin at discharge  Tobacco abuse Current smoker Smoking cessation counseling will be provided  Other Stroke Risk Factors PVD with RLE AKA Hx of substance abuse - THC  Other Active Problems Left eye blind since 2013 Mesenteric ischemia s/p colostomy 12/2022 with septic shock Full code for now  Hospital day # 1  This patient is critically ill due to septic shock, stroke, respiratory failure, pneumonia and UTI and at significant risk of neurological worsening, death form sepsis, septic shock, recurrent stroke and hemorrhagic conversion. This patient's care requires constant monitoring of vital signs, hemodynamics, respiratory and cardiac monitoring, review of multiple databases, neurological assessment, discussion with family, other specialists and medical decision making of high complexity. I spent 60 minutes of neurocritical care time in the care of this patient. I had long discussion with daughter at bedside, updated pt current condition, treatment plan and potential prognosis, and answered all the questions. She expressed understanding and appreciation.     Ary Cummins, MD PhD Stroke Neurology 06/28/2023 11:15 AM    To contact Stroke Continuity provider, please refer to [email or web address]. After hours, contact General Neurology

## 2023-06-28 NOTE — Progress Notes (Signed)
 Seen by stroke team today. They recommended LP. This was attempted by stroke team and IR without success. They are placing him on ASA 81mg . Con't antibiotics. Weaning vent as tolerating. Still not responding purposefully.

## 2023-06-29 ENCOUNTER — Inpatient Hospital Stay (HOSPITAL_COMMUNITY): Payer: Medicaid Other

## 2023-06-29 DIAGNOSIS — R569 Unspecified convulsions: Secondary | ICD-10-CM | POA: Diagnosis not present

## 2023-06-29 DIAGNOSIS — I63431 Cerebral infarction due to embolism of right posterior cerebral artery: Secondary | ICD-10-CM

## 2023-06-29 DIAGNOSIS — J9601 Acute respiratory failure with hypoxia: Secondary | ICD-10-CM | POA: Diagnosis not present

## 2023-06-29 DIAGNOSIS — G9340 Encephalopathy, unspecified: Secondary | ICD-10-CM | POA: Diagnosis not present

## 2023-06-29 LAB — LIPID PANEL
Cholesterol: 107 mg/dL (ref 0–200)
HDL: <10 mg/dL — ABNORMAL LOW (ref >40)
LDL Cholesterol: UNDETERMINED mg/dL (ref 0–99)
Triglycerides: 521 mg/dL — ABNORMAL HIGH (ref <150)
VLDL: UNDETERMINED mg/dL (ref 0–40)

## 2023-06-29 LAB — BASIC METABOLIC PANEL
Anion gap: 12 (ref 5–15)
BUN: 55 mg/dL — ABNORMAL HIGH (ref 8–23)
CO2: 18 mmol/L — ABNORMAL LOW (ref 22–32)
Calcium: 8.4 mg/dL — ABNORMAL LOW (ref 8.9–10.3)
Chloride: 105 mmol/L (ref 98–111)
Creatinine, Ser: 2.41 mg/dL — ABNORMAL HIGH (ref 0.61–1.24)
GFR, Estimated: 29 mL/min — ABNORMAL LOW (ref >60)
Glucose, Bld: 124 mg/dL — ABNORMAL HIGH (ref 70–99)
Potassium: 4.4 mmol/L (ref 3.5–5.1)
Sodium: 135 mmol/L (ref 135–145)

## 2023-06-29 LAB — CBC
HCT: 27.4 % — ABNORMAL LOW (ref 39.0–52.0)
Hemoglobin: 8.7 g/dL — ABNORMAL LOW (ref 13.0–17.0)
MCH: 28.9 pg (ref 26.0–34.0)
MCHC: 31.8 g/dL (ref 30.0–36.0)
MCV: 91 fL (ref 80.0–100.0)
Platelets: 147 10*3/uL — ABNORMAL LOW (ref 150–400)
RBC: 3.01 MIL/uL — ABNORMAL LOW (ref 4.22–5.81)
RDW: 15.2 % (ref 11.5–15.5)
WBC: 10 10*3/uL (ref 4.0–10.5)
nRBC: 0 % (ref 0.0–0.2)

## 2023-06-29 LAB — URINE CULTURE: Culture: >=100000 — AB

## 2023-06-29 LAB — MAGNESIUM: Magnesium: 2.3 mg/dL (ref 1.7–2.4)

## 2023-06-29 LAB — GLUCOSE, CAPILLARY
Glucose-Capillary: 121 mg/dL — ABNORMAL HIGH (ref 70–99)
Glucose-Capillary: 169 mg/dL — ABNORMAL HIGH (ref 70–99)
Glucose-Capillary: 171 mg/dL — ABNORMAL HIGH (ref 70–99)
Glucose-Capillary: 193 mg/dL — ABNORMAL HIGH (ref 70–99)
Glucose-Capillary: 204 mg/dL — ABNORMAL HIGH (ref 70–99)
Glucose-Capillary: 95 mg/dL (ref 70–99)

## 2023-06-29 LAB — LDL CHOLESTEROL, DIRECT: Direct LDL: 25 mg/dL (ref 0–99)

## 2023-06-29 LAB — VANCOMYCIN, RANDOM: Vancomycin Rm: 24 ug/mL

## 2023-06-29 LAB — PHOSPHORUS: Phosphorus: 4.5 mg/dL (ref 2.5–4.6)

## 2023-06-29 MED ORDER — ACETAMINOPHEN 10 MG/ML IV SOLN
1000.0000 mg | Freq: Once | INTRAVENOUS | Status: AC
  Administered 2023-06-29: 1000 mg via INTRAVENOUS
  Filled 2023-06-29: qty 100

## 2023-06-29 MED ORDER — DEXMEDETOMIDINE HCL IN NACL 400 MCG/100ML IV SOLN
0.0000 ug/kg/h | INTRAVENOUS | Status: DC
  Administered 2023-06-29: 0.4 ug/kg/h via INTRAVENOUS
  Filled 2023-06-29 (×2): qty 100
  Filled 2023-06-29: qty 300

## 2023-06-29 MED ORDER — DOXYCYCLINE HYCLATE 100 MG PO TABS
100.0000 mg | ORAL_TABLET | Freq: Two times a day (BID) | ORAL | Status: DC
Start: 2023-06-29 — End: 2023-07-01
  Administered 2023-06-29 – 2023-07-01 (×5): 100 mg
  Filled 2023-06-29 (×6): qty 1

## 2023-06-29 MED ORDER — MIDAZOLAM HCL 2 MG/2ML IJ SOLN
2.0000 mg | INTRAMUSCULAR | Status: DC | PRN
  Administered 2023-06-29 – 2023-06-30 (×3): 2 mg via INTRAVENOUS
  Filled 2023-06-29: qty 2

## 2023-06-29 MED ORDER — SODIUM CHLORIDE 0.9 % IV SOLN
2000.0000 mg | Freq: Two times a day (BID) | INTRAVENOUS | Status: DC
  Administered 2023-06-29 – 2023-07-01 (×5): 2000 mg via INTRAVENOUS
  Filled 2023-06-29 (×6): qty 40

## 2023-06-29 MED ORDER — MIDAZOLAM-SODIUM CHLORIDE 100-0.9 MG/100ML-% IV SOLN
0.0000 mg/h | INTRAVENOUS | Status: DC
  Administered 2023-06-29: 2 mg/h via INTRAVENOUS
  Administered 2023-06-30 (×2): 8 mg/h via INTRAVENOUS
  Administered 2023-07-01: 4 mg/h via INTRAVENOUS
  Administered 2023-07-02: 2 mg/h via INTRAVENOUS
  Administered 2023-07-03: 4 mg/h via INTRAVENOUS
  Filled 2023-06-29 (×6): qty 100

## 2023-06-29 MED ORDER — MIDAZOLAM HCL 2 MG/2ML IJ SOLN
INTRAMUSCULAR | Status: AC
  Filled 2023-06-29: qty 2

## 2023-06-29 NOTE — Plan of Care (Signed)
  Problem: Safety: Goal: Non-violent Restraint(s) Outcome: Progressing   Problem: Fluid Volume: Goal: Ability to maintain a balanced intake and output will improve Outcome: Progressing   Problem: Nutritional: Goal: Maintenance of adequate nutrition will improve Outcome: Progressing   Problem: Skin Integrity: Goal: Risk for impaired skin integrity will decrease Outcome: Progressing   Problem: Tissue Perfusion: Goal: Adequacy of tissue perfusion will improve Outcome: Progressing

## 2023-06-29 NOTE — Progress Notes (Signed)
 NAME:  Gregory [name], MRN:  [id number], DOB:  Mar 31, 1961, LOS: 2 ADMISSION DATE:  06/27/2023, CONSULTATION DATE:  06/27/2023 REFERRING MD:  EDP, CHIEF COMPLAINT:  AMS   History of Present Illness:  63 year old male with significant past medical history including diabetes, hypertension, hyperlipidemia, CKDIIIb, COPD, chronic osteomyelitis s/p R AKA, L midfoot amputation, atrial fibrillation, HFpEF, mesenteric ischemia s/p colostomy 12/2022 who presented to ED from SNF for altered mental status/obtunded. Reported increased liquid stool from ostomy. He was subsequently intubated in the ED. Labs in ED notable for K 5.8, Cr 2.6, WBC 13, trop 83>705, BNP 63, UA large leuk, wbc21-50 few bacteria, UDS negative. Admitted to PCCM.   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 Stroke team/neuro consulted. Recommend stroke work up, d/c meningitis/encephalitis coverage, palliative consult.  1/10 unsuccessful attempts at lumbar puncture  Interim History / Subjective:  Sedation time down, still responsive Intubated No withdrawal to pain he does have a gag, cough  Objective   Blood pressure 125/64, pulse 69, temperature 98.7 F (37.1 C), temperature source Bladder, resp. rate (!) 28, height 6' 0.01 (1.829 m), weight 96.3 kg, SpO2 95%.    Vent Mode: PRVC FiO2 (%):  [40 %-60 %] 40 % Set Rate:  [28 bmp] 28 bmp Vt Set:  [600 mL] 600 mL PEEP:  [8 cmH20] 8 cmH20 Plateau Pressure:  [20 cmH20-22 cmH20] 22 cmH20   Intake/Output Summary (Last 24 hours) at 06/29/2023 0924 Last data filed at 06/29/2023 0912 Gross per 24 hour  Intake 3211.39 ml  Output 2325 ml  Net 886.39 ml   Filed Weights   06/27/23 0631  06/28/23 0500 06/29/23 0421  Weight: 84 kg 96.4 kg 96.3 kg    Examination: General: Chronically ill-appearing, does not appear to be in distress HENT: OG tube, endotracheal tube in place, moist mucous membranes Lungs: Fair air entry bilaterally  cardiovascular: S1-S2 appreciated without murmur soft. Abdomen: Bowel sounds appreciated, colostomy present with stool in bag Extremities: R AKA, L midfoot amputation. L leg appears chronically swollen, multiple scabs  Neuro: sedated. Flickers eyes. Spontaneously moves the R>L upper extremity at random. Does not withdraw to pain stimulus. No withdraw to pain BLE. +gag/cough.  GU: Fair output  I reviewed nursing notes, Consultant notes, hospitalist notes, last 24 h vitals and pain scores, last 48 h intake and output, last 24 h labs and trends, and last 24 h imaging results.  Resolved Hospital Problem list     Assessment & Plan:   Septic shock -Continue aggressive support  Acute hypoxemic respiratory failure secondary to multifocal pneumonia Respiratory viral panel positive for rhinovirus -Continue mechanical ventilation -Target TVol 6-8cc/kgIBW -Target Plateau Pressure < 30cm H20 -Target driving pressure less than 15 cm of water  -Target PaO2 55-65: titrate PEEP/FiO2 per protocol -Ventilator associated pneumonia prevention protocol  Acute right PCA territory infarcts Altered mental status Prior stroke -Appreciate neuro following -Continue coverage for meningitis as LP was not successful -Continue neuroprotective measures including elevation of the head of the bed, normoglycemia, normothermia -Discussed with neurology, does not feel strongly about continuing medications for meningitis -I will consult infectious disease is to see if they can assist with navigating antibiotic therapy  Demand ischemia  Acute kidney injury  on chronic kidney disease stage IIIb -Maintain renal perfusion -Avoid nephrotoxic medications -Replete  electrolytes  Type 2 diabetes -Continue CBG and SSI monitoring  Heart failure with preserved ejection fraction -Usual dose of diuretics on hold at present  Hypertension Hyperlipidemia -Antihypertensives on hold  Chronic obstructive pulmonary disease -Continue Brovana , Pulmicort , Yupelri   Continue antibiotics for pneumonia  His current mental status can be explained by ongoing infection, extension of his previous stroke Best Practice (right click and Reselect all SmartList Selections daily)   Diet/type: tubefeeds DVT prophylaxis: prophylactic heparin   Pressure ulcer(s): POA GI prophylaxis: H2B Lines: Central line, Arterial Line, and yes and it is still needed Foley:  Yes, and it is still needed Code Status:  full code Last date of multidisciplinary goals of care discussion [pending]  Labs   CBC: Recent Labs  Lab 06/27/23 0558 06/27/23 0838 06/28/23 0305 06/28/23 0306 06/28/23 0803 06/29/23 0403  WBC 13.0*  --  27.5*  --   --  10.0  NEUTROABS 11.0*  --   --   --   --   --   HGB 11.7* 12.6* 10.9* 11.2* 10.5* 8.7*  HCT 38.1* 37.0* 35.1* 33.0* 31.0* 27.4*  MCV 95.0  --  92.6  --   --  91.0  PLT 311  --  280  --   --  147*    Basic Metabolic Panel: Recent Labs  Lab 06/27/23 0558 06/27/23 0838 06/27/23 1352 06/28/23 0305 06/28/23 0306 06/28/23 0803 06/28/23 1941 06/29/23 0403  NA 138   < > 136 133* 134* 135 133* 135  K 5.8*   < > 4.3 5.7* 5.8* 5.1 4.5 4.4  CL 103  --  104 104  --   --  104 105  CO2 21*  --  21* 18*  --   --  18* 18*  GLUCOSE 106*  --  102* 139*  --   --  158* 124*  BUN 48*  --  50* 53*  --   --  54* 55*  CREATININE 2.60*  --  2.82* 2.62*  --   --  2.56* 2.41*  CALCIUM  8.7*  --  8.1* 8.1*  --   --  8.3* 8.4*  MG  --   --   --  1.5*  --   --   --  2.3  PHOS  --   --   --  5.3*  --   --   --  4.5   < > = values in this interval not displayed.   GFR: Estimated Creatinine Clearance: 37.3 mL/min (A) (by C-G formula based on SCr of 2.41  mg/dL (H)). Recent Labs  Lab 06/27/23 0558 06/27/23 0604 06/27/23 0813 06/27/23 0824 06/28/23 0305 06/29/23 0403  PROCALCITON  --   --  1.65  --   --   --   WBC 13.0*  --   --   --  27.5* 10.0  LATICACIDVEN  --  1.8  --  0.9  --   --     Liver Function Tests: Recent Labs  Lab 06/27/23 0558  AST 18  ALT 14  ALKPHOS 107  BILITOT 0.6  PROT 7.0  ALBUMIN  3.1*   No results for input(s): LIPASE, AMYLASE in the last 168 hours. Recent Labs  Lab 06/27/23 0925  AMMONIA 30    ABG    Component Value Date/Time   PHART 7.211 (L) 06/28/2023 0803   PCO2ART 51.2 (H) 06/28/2023 0803   PO2ART 99 06/28/2023 0803  HCO3 20.4 06/28/2023 0803   TCO2 22 06/28/2023 0803   ACIDBASEDEF 7.0 (H) 06/28/2023 0803   O2SAT 96 06/28/2023 0803     Coagulation Profile: Recent Labs  Lab 06/27/23 0558  INR 1.1    Cardiac Enzymes: No results for input(s): CKTOTAL, CKMB, CKMBINDEX, TROPONINI in the last 168 hours.  HbA1C: Hemoglobin A1C  Date/Time Value Ref Range Status  08/14/2016 12:00 AM 5.6  Final  01/18/2016 12:00 AM 5.5  Final   Hgb A1c MFr Bld  Date/Time Value Ref Range Status  06/27/2023 09:25 AM 6.4 (H) 4.8 - 5.6 % Final    Comment:    (NOTE) Pre diabetes:          5.7%-6.4%  Diabetes:              >6.4%  Glycemic control for   <7.0% adults with diabetes   12/03/2022 04:54 PM 6.9 (H) 4.8 - 5.6 % Final    Comment:    (NOTE) Pre diabetes:          5.7%-6.4%  Diabetes:              >6.4%  Glycemic control for   <7.0% adults with diabetes     CBG: Recent Labs  Lab 06/28/23 1812 06/28/23 2051 06/28/23 2330 06/29/23 0413 06/29/23 0815  GLUCAP 167* 136* 121* 121* 169*    Review of Systems:   As above   Past Medical History:  He,  has a past medical history of Acquired absence of left great toe (HCC), Acquired absence of right leg below knee (HCC), Acute osteomyelitis of calcaneum, right (HCC) (10/02/2016), Acute respiratory failure (HCC),  Amputation stump infection (HCC) (08/12/2018), Anemia, Basal cell carcinoma, eyelid, Cancer (HCC), Candidiasis (08/12/2018), CHF (congestive heart failure) (HCC), Chronic back pain, Chronic kidney disease, Chronic multifocal osteomyelitis (HCC), Chronic pain syndrome, COPD (chronic obstructive pulmonary disease) (HCC), DDD (degenerative disc disease), lumbar, Depression, Diabetes mellitus without complication (HCC), Diabetic ulcer of toe of left foot (HCC) (10/02/2016), Difficult intubation, Difficulty in walking, not elsewhere classified, 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), Foot drop, bilateral, Foot drop, right (12/27/2015), GERD (gastroesophageal reflux disease), Herpesviral vesicular dermatitis, History of COVID-19 (03/15/2022), Hyperlipidemia, Hyperlipidemia, Hypertension, Insomnia, Malignant melanoma of other parts of face (HCC), Melanoma (HCC), MRSA bacteremia, Muscle weakness (generalized), Nasal congestion, Necrosis of toe (HCC) (07/08/2018), Neuromuscular dysfunction of bladder, Osteomyelitis (HCC), Osteomyelitis (HCC), Osteomyelitis (HCC), Other idiopathic peripheral autonomic neuropathy, Retention of urine, unspecified, Squamous cell carcinoma of skin, Stroke (HCC), Unsteadiness on feet, Urinary retention, Urinary retention, Urinary tract infection, Wears dentures, and Wears glasses.   Surgical History:   Past Surgical History:  Procedure Laterality Date   AMPUTATION Left 03/29/2017   Procedure: LEFT GREAT TOE AMPUTATION AT METATARSOPHALANGEAL JOINT;  Surgeon: Harden Jerona GAILS, MD;  Location[facility] OR;  Service: Orthopedics;  Laterality: Left;   AMPUTATION Right 03/29/2017   Procedure: RIGHT BELOW KNEE AMPUTATION;  Surgeon: Harden Jerona GAILS, MD;  Location: [facility] OR;  Service: Orthopedics;  Laterality: Right;   AMPUTATION Left 06/06/2018   Procedure: LEFT 2ND TOE AMPUTATION;  Surgeon: Harden Jerona GAILS, MD;  Location: [facility] OR;  Service: Orthopedics;  Laterality: Left;   AMPUTATION Right 11/19/2018   Procedure:  AMPUTATION ABOVE KNEE;  Surgeon: Harden Jerona GAILS, MD;  Location: [facility] OR;  Service: Orthopedics;  Laterality: Right;   ANTERIOR CERVICAL CORPECTOMY N/A 11/25/2015   Procedure: ANTERIOR CERVICAL FIVE CORPECTOMY Cervical four - six fusion;  Surgeon: Gerldine Maizes, MD;  Location: [facility];  Service: Neurosurgery;  Laterality:  N/A;  ANTERIOR CERVICAL FIVE CORPECTOMY Cervical four - six fusion   APPLICATION OF WOUND VAC Right 10/22/2018   Procedure: Application Of Wound Vac;  Surgeon: Harden Jerona GAILS, MD;  Location[facility] OR;  Service: Orthopedics;  Laterality: Right;   CYSTOSCOPY WITH BIOPSY N/A 05/01/2022   Procedure: CYSTOSCOPY WITH URETHRAL BIOPSY;  Surgeon: Elisabeth Valli BIRCH, MD;  Location: [facility] ORS;  Service: Urology;  Laterality: N/A;  1 HR   CYSTOSCOPY WITH RETROGRADE URETHROGRAM N/A 04/25/2018   Procedure: CYSTOSCOPY WITH RETROGRADE URETHROGRAM/ BALLOON DILATION;  Surgeon: Ottelin, Mark, MD;  Location: [facility] ORS;  Service: Urology;  Laterality: N/A;   CYSTOSCOPY WITH URETHRAL DILATATION N/A 05/01/2022   Procedure: BALLOON DILATION WITH  OPTILUME;  Surgeon: Elisabeth Valli BIRCH, MD;  Location: [facility] ORS;  Service: Urology;  Laterality: N/A;   LAPAROTOMY N/A 12/03/2022   Procedure: EXPLORATORY LAPAROTOMY  total abdominal colectomy with end ileostomy;  Surgeon: Kinsinger, Herlene Righter, MD;  Location: [facility] OR;  Service: General;  Laterality: N/A;   MULTIPLE TOOTH EXTRACTIONS     POSTERIOR CERVICAL FUSION/FORAMINOTOMY N/A 11/29/2015   Procedure: Cervical Three-Cervical Seven Posterior Cervical Laminectomy with Fusion;  Surgeon: Gerldine Maizes, MD;  Location: [facility];  Service: Neurosurgery;  Laterality: N/A;  Cervical Three-Cervical Seven Posterior Cervical Laminectomy with Fusion   STUMP REVISION Right 10/22/2018   Procedure: REVISION RIGHT BELOW KNEE AMPUTATION;  Surgeon: Harden Jerona GAILS, MD;  Location: [facility] OR;  Service: Orthopedics;  Laterality: Right;   STUMP REVISION Right 12/05/2018   Procedure: Revision Right  Above Knee Amputation;  Surgeon: Harden Jerona GAILS, MD;  Location: [facility] OR;  Service: Orthopedics;  Laterality: Right;   STUMP REVISION Right 05/29/2019   Procedure: REVISION RIGHT ABOVE KNEE AMPUTATION;  Surgeon: Harden Jerona GAILS, MD;  Location: [facility] OR;  Service: Orthopedics;  Laterality: Right;   STUMP REVISION Right 06/24/2019   Procedure: STUMP REVISION;  Surgeon: Harden Jerona GAILS, MD;  Location: [facility] OR;  Service: Orthopedics;  Laterality: Right;     Social History:   reports that he has been smoking cigarettes. He has never used smokeless tobacco. He reports that he does not drink alcohol  and does not use drugs.   Family History:  His family history includes Bone cancer in his father; Cancer in his father, mother, paternal grandfather, and another family member; Diabetes in his mother and another family member; Heart disease in his mother; Prostate cancer in his father.   Allergies Allergies  Allergen Reactions   Latex Other (See Comments)    ALLERGIC, per [facility]     The patient is critically ill with multiple organ systems failure and requires high complexity decision making for assessment and support, frequent evaluation and titration of therapies, application of advanced monitoring technologies and extensive interpretation of multiple databases. Critical Care Time devoted to patient care services described in this note independent of APP/resident time (if applicable)  is 33 minutes.   Jennet Epley MD [facility] Personal pager: See Amion If unanswered, please page CCM On-call: [phone]

## 2023-06-29 NOTE — Progress Notes (Signed)
 Pharmacy Antibiotic Note  Gregory [name] is a 63 y.o. male admitted on 06/27/2023 with possible meningitis.  Pharmacy has been consulted for acyclovir  and vancomycin  dosing.   -Wt= 84> 96kg  -SCr= 2.82> 2.41 (baseline ~ 2.0), CrCl ~ 30 Vancomycin  random = 24 @ 04:00 (~18 hour level). Estimated ke = 0.014 (t1/2 ~48 hour).   Plan: -Acyclovir  900mg  IV Q24hr, continue  -Continue normal saline at 50ml/hr for renal protection (discussed with MD) -Hold vancomycin  for today, repeat random level tomorrow AM  -Will follow renal function, cultures and clinical progress   Height: 6' 0.01 (182.9 cm) Weight: 96.3 kg (212 lb 4.9 oz) IBW/kg (Calculated) : 77.62  Temp (24hrs), Avg:99.7 F (37.6 C), Min:98.7 F (37.1 C), Max:101.4 F (38.6 C)  Recent Labs  Lab 06/27/23 0558 06/27/23 0604 06/27/23 0824 06/27/23 1352 06/28/23 0305 06/28/23 1941 06/29/23 0403  WBC 13.0*  --   --   --  27.5*  --  10.0  CREATININE 2.60*  --   --  2.82* 2.62* 2.56* 2.41*  LATICACIDVEN  --  1.8 0.9  --   --   --   --   VANCORANDOM  --   --   --   --  17  --  24    Estimated Creatinine Clearance: 37.3 mL/min (A) (by C-G formula based on SCr of 2.41 mg/dL (H)).    Allergies  Allergen Reactions   Latex Other (See Comments)    ALLERGIC, per [facility]    Antimicrobials this admission: 1/9 rocephin  x1 1/9 ampicillin  >> 1/10 1/9 acyclovir  x1 1/9 metronidazole  x1 1/9 vancomycin >>  1/9 zosyn  >> 1/11 1/9 Azithromycin  >> 1/11 meropenem  >>   Dose adjustments this admission:   Microbiology results: 1/9 Bcx: ngtd 1/9 Urine Cx: ESBL E Coli  1/9 GI Panel:  1/9 RVP: Rhino/Enterovirus (+) 1/10 Resp Cx: GNRs MRSA (+) C diff (-)  Thank you for allowing pharmacy to be a part of this patient's care.  Rankin Sams, PharmD, BCPS, BCCCP Clinical Pharmacist

## 2023-06-29 NOTE — Progress Notes (Signed)
 STROKE TEAM PROGRESS NOTE   SUBJECTIVE (INTERVAL HISTORY) His RN is at the bedside. Pt remains intubated on light sedation.  On sternal rub we will open eyes but is not following commands. On LTM EEG showing only generalized slowing and no seizure activity.Gregory [name]  Spinal tap unsuccessful at the bedside and event of the fluoroscopy.  And MRI showed right PCA acute on chronic infarct. CCM has de-escalate his Abx, now on zosyn , vanco and azithromycin .  He is afebrile this morning and white count has normalized   OBJECTIVE Temp:  [98.7 F (37.1 C)-101.4 F (38.6 C)] 98.7 F (37.1 C) (01/11 0800) Pulse Rate:  [69-122] 69 (01/11 0845) Cardiac Rhythm: Normal sinus rhythm (01/11 0800) Resp:  [15-28] 28 (01/11 0845) BP: (88-153)/(48-71) 125/64 (01/11 0800) SpO2:  [90 %-100 %] 95 % (01/11 0845) Arterial Line BP: (92-182)/(40-81) 129/51 (01/11 0845) FiO2 (%):  [40 %-60 %] 40 % (01/11 0800) Weight:  [96.3 kg] 96.3 kg (01/11 0421)  Recent Labs  Lab 06/28/23 1812 06/28/23 2051 06/28/23 2330 06/29/23 0413 06/29/23 0815  GLUCAP 167* 136* 121* 121* 169*   Recent Labs  Lab 06/27/23 0558 06/27/23 9161 06/27/23 1352 06/28/23 0305 06/28/23 0306 06/28/23 0803 06/28/23 1941 06/29/23 0403  NA 138   < > 136 133* 134* 135 133* 135  K 5.8*   < > 4.3 5.7* 5.8* 5.1 4.5 4.4  CL 103  --  104 104  --   --  104 105  CO2 21*  --  21* 18*  --   --  18* 18*  GLUCOSE 106*  --  102* 139*  --   --  158* 124*  BUN 48*  --  50* 53*  --   --  54* 55*  CREATININE 2.60*  --  2.82* 2.62*  --   --  2.56* 2.41*  CALCIUM  8.7*  --  8.1* 8.1*  --   --  8.3* 8.4*  MG  --   --   --  1.5*  --   --   --  2.3  PHOS  --   --   --  5.3*  --   --   --  4.5   < > = values in this interval not displayed.   Recent Labs  Lab 06/27/23 0558  AST 18  ALT 14  ALKPHOS 107  BILITOT 0.6  PROT 7.0  ALBUMIN  3.1*   Recent Labs  Lab 06/27/23 0558 06/27/23 0838 06/28/23 0305 06/28/23 0306 06/28/23 0803 06/29/23 0403  WBC  13.0*  --  27.5*  --   --  10.0  NEUTROABS 11.0*  --   --   --   --   --   HGB 11.7* 12.6* 10.9* 11.2* 10.5* 8.7*  HCT 38.1* 37.0* 35.1* 33.0* 31.0* 27.4*  MCV 95.0  --  92.6  --   --  91.0  PLT 311  --  280  --   --  147*   No results for input(s): CKTOTAL, CKMB, CKMBINDEX, TROPONINI in the last 168 hours. Recent Labs    06/27/23 0558  LABPROT 14.0  INR 1.1   Recent Labs    06/27/23 0659 06/27/23 1411  COLORURINE YELLOW GREEN*  LABSPEC 1.006 TEST NOT REPORTED DUE TO COLOR INTERFERENCE OF URINE PIGMENT  PHURINE 6.0 TEST NOT REPORTED DUE TO COLOR INTERFERENCE OF URINE PIGMENT  GLUCOSEU NEGATIVE TEST NOT REPORTED DUE TO COLOR INTERFERENCE OF URINE PIGMENT*  HGBUR SMALL* TEST NOT REPORTED DUE TO COLOR INTERFERENCE  OF URINE PIGMENT*  BILIRUBINUR NEGATIVE TEST NOT REPORTED DUE TO COLOR INTERFERENCE OF URINE PIGMENT*  KETONESUR NEGATIVE TEST NOT REPORTED DUE TO COLOR INTERFERENCE OF URINE PIGMENT*  PROTEINUR 30* TEST NOT REPORTED DUE TO COLOR INTERFERENCE OF URINE PIGMENT*  NITRITE NEGATIVE TEST NOT REPORTED DUE TO COLOR INTERFERENCE OF URINE PIGMENT*  LEUKOCYTESUR LARGE* TEST NOT REPORTED DUE TO COLOR INTERFERENCE OF URINE PIGMENT*       Component Value Date/Time   CHOL 107 06/29/2023 0403   TRIG 521 (H) 06/29/2023 0403   HDL <10 (L) 06/29/2023 0403   CHOLHDL NOT CALCULATED 06/29/2023 0403   VLDL UNABLE TO CALCULATE IF TRIGLYCERIDE OVER 400 mg/dL 98/88/7974 9596   LDLCALC UNABLE TO CALCULATE IF TRIGLYCERIDE OVER 400 mg/dL 98/88/7974 9596   Lab Results  Component Value Date   HGBA1C 6.4 (H) 06/27/2023      Component Value Date/Time   LABOPIA NONE DETECTED 06/27/2023 0748   COCAINSCRNUR NONE DETECTED 06/27/2023 0748   LABBENZ NONE DETECTED 06/27/2023 0748   AMPHETMU NONE DETECTED 06/27/2023 0748   THCU NONE DETECTED 06/27/2023 0748   LABBARB NONE DETECTED 06/27/2023 0748    No results for input(s): ETH in the last 168 hours.  I have personally reviewed the  radiological images below and agree with the radiology interpretations.  ECHOCARDIOGRAM COMPLETE Result Date: 06/28/2023    ECHOCARDIOGRAM REPORT   Patient Name:   Gregory [name] Date of Exam: 06/28/2023 Medical Rec #:  [id number]    Height:       72.0 in Accession #:    [id number]   Weight:       212.5 lb Date of Birth:  24-Mar-1961   BSA:          2.186 m Patient Age:    62 years     BP:           101/58 mmHg Patient Gender: M            HR:           90 bpm. Exam Location:  Inpatient Procedure: 2D Echo, Cardiac Doppler and Color Doppler Indications:    Stroke I63.9  History:        Patient has prior history of Echocardiogram examinations, most                 recent 12/06/2022. CHF, COPD, Stroke and CKD, stage 3,                 Signs/Symptoms:Shortness of Breath; Risk Factors:Hypertension,                 Diabetes, Current Smoker and Dyslipidemia.  Sonographer:    Thea Norlander RCS Referring Phys: [id number] ASHISH ARORA IMPRESSIONS  1. Left ventricular ejection fraction, by estimation, is 65 to 70%. The left ventricle has hyperdynamic function. The left ventricle has no regional wall motion abnormalities. Left ventricular diastolic parameters were normal.  2. Right ventricular systolic function is normal. The right ventricular size is mildly enlarged. Tricuspid regurgitation signal is inadequate for assessing PA pressure.  3. The mitral valve is normal in structure. No evidence of mitral valve regurgitation. No evidence of mitral stenosis.  4. The aortic valve is normal in structure. There is mild calcification of the aortic valve. There is mild thickening of the aortic valve. Aortic valve regurgitation is mild. Aortic valve sclerosis/calcification is present, without any evidence of aortic stenosis.  5. The inferior vena cava is normal in size with <50% respiratory variability, suggesting  right atrial pressure of 8 mmHg. FINDINGS  Left Ventricle: Left ventricular ejection fraction, by estimation, is 65 to 70%.  The left ventricle has hyperdynamic function. The left ventricle has no regional wall motion abnormalities. The left ventricular internal cavity size was small. There is no  left ventricular hypertrophy. Left ventricular diastolic parameters were normal. Right Ventricle: The right ventricular size is mildly enlarged. No increase in right ventricular wall thickness. Right ventricular systolic function is normal. Tricuspid regurgitation signal is inadequate for assessing PA pressure. Left Atrium: Left atrial size was normal in size. Right Atrium: Right atrial size was normal in size. Pericardium: There is no evidence of pericardial effusion. Mitral Valve: The mitral valve is normal in structure. No evidence of mitral valve regurgitation. No evidence of mitral valve stenosis. Tricuspid Valve: The tricuspid valve is normal in structure. Tricuspid valve regurgitation is trivial. No evidence of tricuspid stenosis. Aortic Valve: The aortic valve is normal in structure. There is mild calcification of the aortic valve. There is mild thickening of the aortic valve. Aortic valve regurgitation is mild. Aortic valve sclerosis/calcification is present, without any evidence of aortic stenosis. Aortic valve mean gradient measures 10.0 mmHg. Aortic valve peak gradient measures 20.2 mmHg. Aortic valve area, by VTI measures 2.25 cm. Pulmonic Valve: The pulmonic valve was normal in structure. Pulmonic valve regurgitation is not visualized. No evidence of pulmonic stenosis. Aorta: The aortic root and ascending aorta are structurally normal, with no evidence of dilitation. Venous: The inferior vena cava is normal in size with less than 50% respiratory variability, suggesting right atrial pressure of 8 mmHg. IAS/Shunts: No atrial level shunt detected by color flow Doppler.  LEFT VENTRICLE PLAX 2D LVIDd:         5.00 cm   Diastology LVIDs:         3.60 cm   LV e' medial:    7.72 cm/s LV PW:         1.00 cm   LV E/e' medial:  9.5 LV IVS:         0.80 cm   LV e' lateral:   12.80 cm/s LVOT diam:     2.40 cm   LV E/e' lateral: 5.7 LV SV:         74 LV SV Index:   34 LVOT Area:     4.52 cm  RIGHT VENTRICLE             IVC RV S prime:     14.80 cm/s  IVC diam: 2.80 cm TAPSE (M-mode): 2.9 cm LEFT ATRIUM             Index        RIGHT ATRIUM           Index LA diam:        3.60 cm 1.65 cm/m   RA Area:     15.90 cm LA Vol (A2C):   28.8 ml 13.17 ml/m  RA Volume:   41.60 ml  19.03 ml/m LA Vol (A4C):   20.3 ml 9.29 ml/m LA Biplane Vol: 24.3 ml 11.11 ml/m  AORTIC VALVE AV Area (Vmax):    2.08 cm AV Area (Vmean):   2.16 cm AV Area (VTI):     2.25 cm AV Vmax:           224.50 cm/s AV Vmean:          144.500 cm/s AV VTI:            0.329 m AV  Peak Grad:      20.2 mmHg AV Mean Grad:      10.0 mmHg LVOT Vmax:         103.33 cm/s LVOT Vmean:        69.033 cm/s LVOT VTI:          0.163 m LVOT/AV VTI ratio: 0.50  AORTA Ao Root diam: 3.40 cm Ao Asc diam:  3.40 cm MITRAL VALVE               TRICUSPID VALVE MV Area (PHT): 4.21 cm    TR Peak grad:   11.6 mmHg MV Decel Time: 180 msec    TR Vmax:        170.00 cm/s MV E velocity: 73.50 cm/s MV A velocity: 72.70 cm/s  SHUNTS MV E/A ratio:  1.01        Systemic VTI:  0.16 m                            Systemic Diam: 2.40 cm Morene Brownie Electronically signed by Morene Brownie Signature Date/Time: 06/28/2023/2:33:49 PM    Final    Overnight EEG with video Result Date: 06/28/2023 Shelton Arlin KIDD, MD     06/29/2023  9:12 AM Patient Name: Gregory [name] MRN: [id number] Epilepsy Attending: Arlin KIDD Shelton Referring Physician/Provider: Judithe Rocky BROCKS, NP Duration: 06/27/2023 2356 to 06/29/2023 0200 Patient history: 63yo M with ams getting eeg to evaluate for seizure Level of alertness: comatose AEDs during EEG study: Propofol  Technical aspects: This EEG study was done with scalp electrodes positioned according to the 10-20 International system of electrode placement. Electrical activity was reviewed with band pass filter of  1-70Hz , sensitivity of 7 uV/mm, display speed of 16mm/sec with a 60Hz  notched filter applied as appropriate. EEG data were recorded continuously and digitally stored.  Video monitoring was available and reviewed as appropriate. Description: EEG showed continuous generalized 3 to 6 Hz theta-delta slowing. Hyperventilation and photic stimulation were not performed.   EEG was disconnected between 06/28/2023 1436 to 1639 ABNORMALITY - Continuous slow, generalized IMPRESSION: This study is suggestive of severe diffuse encephalopathy. No seizures or epileptiform discharges were seen throughout the recording. Arlin KIDD Shelton   DG Chest Port 1 View Result Date: 06/28/2023 CLINICAL DATA:  [id number]. Encounter for pneumonia. Ventilator dependent respiratory failure. EXAM: PORTABLE CHEST 1 VIEW COMPARISON:  Chest CT without contrast yesterday at 7:51 p.m. FINDINGS: 3:56 a.m. ETT tip is 5 cm from the carina, NGT curves around to the left in the stomach with the tip in the upper medial lumen. Right IJ central line tip at the superior cavoatrial junction. Widespread right-greater-than-left airspace disease is again noted, with relative sparing in the left upper lung field. No pleural effusion is seen. There is mild cardiomegaly, mild central vascular prominence. Stable mediastinum. There is aortic atherosclerosis.  No acute or new osseous findings. IMPRESSION: 1. Widespread right-greater-than-left airspace disease, with relative sparing in the left upper lung field. No new or worsening infiltrate. 2. Mild cardiomegaly with mild central vascular prominence. 3. Support apparatus as above. 4. Aortic atherosclerosis. Electronically Signed   By: Francis Quam M.D.   On: 06/28/2023 05:58   MR BRAIN WO CONTRAST Result Date: 06/27/2023 CLINICAL DATA:  Initial evaluation for acute neuro deficit, stroke suspected. EXAM: MRI HEAD WITHOUT CONTRAST TECHNIQUE: Multiplanar, multiecho pulse sequences of the brain and surrounding structures  were obtained without intravenous contrast. COMPARISON:  Prior study from earlier the  same day. FINDINGS: Brain: Age-related cerebral atrophy. Patchy and confluent T2/FLAIR hyperintensity involving the periventricular deep white matter both cerebral hemispheres, consistent with chronic small vessel ischemic disease, moderately advanced. Patchy involvement of the pons. Encephalomalacia and gliosis involving the right occipital lobe, consistent with a chronic left PCA distribution infarct. Associated chronic hemosiderin staining within this region. Superimposed restricted diffusion within the right temporal occipital region, consistent with an acute on chronic right PCA distribution infarct. Patchy involvement of the right thalamus. No associated hemorrhage or significant regional mass effect. No other evidence for acute or subacute ischemia. No acute intracranial hemorrhage. Chronic microhemorrhage at the anterior right temporal lobe noted. No mass lesion, midline shift or mass effect. No hydrocephalus or extra-axial fluid collection. Pituitary gland suprasellar region within normal limits. Vascular: Major intracranial vascular flow voids are maintained. Skull and upper cervical spine: Cranial junctional is. Postoperative changes partially visualize within the cervical spine. Bone marrow signal intensity normal. No scalp soft tissue abnormality. Sinuses/Orbits: Globes orbital soft tissues within normal limits. Mucosal thickening noted about the sphenoid ethmoidal and maxillary sinuses. Superimposed air-fluid level within the right maxillary sinus. Small bilateral mastoid effusions. Patient is intubated. Other: None. IMPRESSION: 1. 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 No associated hemorrhage or significant regional mass effect. 2. Underlying age-related cerebral atrophy with moderate chronic microvascular ischemic disease. Electronically Signed   By: Morene Hoard M.D.   On: 06/27/2023 21:28   CT CHEST ABDOMEN PELVIS WO CONTRAST Result Date: 06/27/2023 CLINICAL DATA:  Pneumonia, sepsis EXAM: CT CHEST, ABDOMEN AND PELVIS WITHOUT CONTRAST TECHNIQUE: Multidetector CT imaging of the chest, abdomen and pelvis was performed following the standard protocol without IV contrast. RADIATION DOSE REDUCTION: This exam was performed according to the departmental dose-optimization program which includes automated exposure control, adjustment of the mA and/or kV according to patient size and/or use of iterative reconstruction technique. COMPARISON:  CT abdomen and pelvis 02/08/2023. FINDINGS: CT CHEST FINDINGS Cardiovascular: Heart is normal size. Aorta is normal caliber. Moderate coronary artery and aortic calcifications. Mediastinum/Nodes: No mediastinal, hilar, or axillary adenopathy. Trachea and esophagus are unremarkable. Endotracheal tube tip in the midtrachea. Thyroid unremarkable. Lungs/Pleura: Dense consolidation in both lower lobes compatible with pneumonia. No effusions. Patchy ground-glass opacities in the upper lobes as well. Musculoskeletal: Chest wall soft tissues are unremarkable. No acute bony abnormality. CT ABDOMEN PELVIS FINDINGS Hepatobiliary: Small layering gallstones within the gallbladder. No focal hepatic abnormality or biliary ductal dilatation. Pancreas: No focal abnormality or ductal dilatation. Spleen: No focal abnormality.  Normal size. Adrenals/Urinary Tract: Foley catheter in the bladder which is decompressed. No renal or adrenal mass. No stones or hydronephrosis. Stomach/Bowel: Prior colectomy. Right lower quadrant ostomy noted. No bowel obstruction. NG tube is in the stomach. Vascular/Lymphatic: Aortic atherosclerosis. No evidence of aneurysm or adenopathy. Reproductive: Mildly prominent prostate. Other: No free fluid or free air. Musculoskeletal: No acute bony abnormality. IMPRESSION: Dense consolidation in both lower lobes with patchy  opacities in the upper lobes. Findings compatible with multifocal pneumonia. Coronary artery disease, aortic atherosclerosis. Right lower quadrant ostomy.  No evidence of bowel obstruction. Cholelithiasis.  No CT evidence for acute cholecystitis. Electronically Signed   By: Franky Crease M.D.   On: 06/27/2023 20:01   DG CHEST PORT 1 VIEW Result Date: 06/27/2023 CLINICAL DATA:  Central line placement. EXAM: PORTABLE CHEST 1 VIEW COMPARISON:  Same day. FINDINGS: Endotracheal and nasogastric tubes are unchanged. Interval placement of right internal jugular catheter with distal tip in  expected position of the SVC. No pneumothorax is noted. Stable diffuse right lung interstitial opacity is noted concerning for asymmetric edema or atypical inflammation. IMPRESSION: Interval placement of right internal jugular catheter with distal tip in expected position of the SVC. Electronically Signed   By: Lynwood Landy Raddle M.D.   On: 06/27/2023 12:40   CT HEAD WO CONTRAST ( ) Result Date: 06/27/2023 CLINICAL DATA:  Altered mental status. EXAM: CT HEAD WITHOUT CONTRAST TECHNIQUE: Contiguous axial images were obtained from the base of the skull through the vertex without intravenous contrast. RADIATION DOSE REDUCTION: This exam was performed according to the departmental dose-optimization program which includes automated exposure control, adjustment of the mA and/or kV according to patient size and/or use of iterative reconstruction technique. COMPARISON:  CT head dated February 08, 2023. FINDINGS: Brain: New loss of the normal gray-white matter differentiation along the inferior right temporal lobe (series 3, image 14; series 5, images 35-42). No evidence of hemorrhage, hydrocephalus, or extra-axial collection. Old right occipital lobe infarct again noted. Vascular: Calcified atherosclerosis at the skull base. No hyperdense vessel. Skull: Normal. Negative for fracture or focal lesion. Sinuses/Orbits: No acute finding. Other: Similar  postsurgical changes right pre maxillary soft tissues. IMPRESSION: 1. New loss of the normal gray-white matter differentiation along the inferior right temporal lobe, concerning for acute infarct. No hemorrhage. Consider further evaluation with MRI. 2. Old right occipital lobe infarct. Electronically Signed   By: Elsie ONEIDA Shoulder M.D.   On: 06/27/2023 11:00   DG Chest Port 1 View Result Date: 06/27/2023 CLINICAL DATA:  Ventilator dependent respiratory failure. [id number] encounter for orogastric tube placement. EXAM: PORTABLE ABDOMEN - 1 VIEW; PORTABLE CHEST - 1 VIEW COMPARISON:  Portable chest today at 6:10 a.m., abdomen and pelvis CT 02/08/2023 FINDINGS: Chest AP portable 6:48 a.m.: Interval intubation with tip of the ETT 4.6 cm from carina. NGT interval insertion. The heart is enlarged. There is worsening central vascular congestion and perihilar edema. There is increased right-greater-than-left perihilar interstitial Dyane most likely due to ground-glass edema. The more peripheral lungs are essentially clear. Small pleural effusions are forming. The mediastinum is stable.  No new osseous abnormality. Flat plate single anteverted view, portable at 6:51 a.m.: NGT curves to the left and cephalad within the stomach with the tip along the proximal fundus. Compared with CT 02/08/2023 there is severe dilatation of the stomach with air and food products. Correlate clinically for gastric outlet obstruction or impaired gastric emptying. The bowel pattern appears nonobstructive. There is no supine evidence of free air. No other significant radiographic findings. IMPRESSION: 1. Interval intubation with tip of the ETT 4.6 cm from carina. 2. NGT curves to the left and cephalad within the stomach with the tip along the proximal fundus. 3. Compared with CT 02/08/2023 there is severe dilatation of the stomach with air and food products. Correlate clinically for gastric outlet obstruction or impaired gastric emptying. 4. Worsening  central vascular congestion and perihilar edema. 5. Developing small pleural effusions. Electronically Signed   By: Francis Quam M.D.   On: 06/27/2023 07:27   DG Abd Portable 1V Result Date: 06/27/2023 CLINICAL DATA:  Ventilator dependent respiratory failure. [id number] encounter for orogastric tube placement. EXAM: PORTABLE ABDOMEN - 1 VIEW; PORTABLE CHEST - 1 VIEW COMPARISON:  Portable chest today at 6:10 a.m., abdomen and pelvis CT 02/08/2023 FINDINGS: Chest AP portable 6:48 a.m.: Interval intubation with tip of the ETT 4.6 cm from carina. NGT interval insertion. The heart is enlarged. There is worsening central vascular  congestion and perihilar edema. There is increased right-greater-than-left perihilar interstitial Dyane most likely due to ground-glass edema. The more peripheral lungs are essentially clear. Small pleural effusions are forming. The mediastinum is stable.  No new osseous abnormality. Flat plate single anteverted view, portable at 6:51 a.m.: NGT curves to the left and cephalad within the stomach with the tip along the proximal fundus. Compared with CT 02/08/2023 there is severe dilatation of the stomach with air and food products. Correlate clinically for gastric outlet obstruction or impaired gastric emptying. The bowel pattern appears nonobstructive. There is no supine evidence of free air. No other significant radiographic findings. IMPRESSION: 1. Interval intubation with tip of the ETT 4.6 cm from carina. 2. NGT curves to the left and cephalad within the stomach with the tip along the proximal fundus. 3. Compared with CT 02/08/2023 there is severe dilatation of the stomach with air and food products. Correlate clinically for gastric outlet obstruction or impaired gastric emptying. 4. Worsening central vascular congestion and perihilar edema. 5. Developing small pleural effusions. Electronically Signed   By: Francis Quam M.D.   On: 06/27/2023 07:27   DG Chest Port 1 View Result Date:  06/27/2023 CLINICAL DATA:  Unresponsive. EXAM: PORTABLE CHEST 1 VIEW COMPARISON:  02/08/2023 FINDINGS: The cardio pericardial silhouette is enlarged. Vascular congestion diffuse noted with diffuse interstitial opacity suggesting edema. No focal consolidation. No pneumothorax or pleural effusion. Gaseous distention of stomach or bowel is seen in the the left hemidiaphragm. Telemetry leads overlie the chest. IMPRESSION: Enlargement of the cardiopericardial silhouette with vascular congestion and diffuse interstitial opacity suggesting edema. Gaseous distention of stomach or bowel noted under the left hemidiaphragm. Electronically Signed   By: Camellia Candle M.D.   On: 06/27/2023 06:32     PHYSICAL EXAM  Temp:  [98.7 F (37.1 C)-101.4 F (38.6 C)] 98.7 F (37.1 C) (01/11 0800) Pulse Rate:  [69-122] 69 (01/11 0845) Resp:  [15-28] 28 (01/11 0845) BP: (88-153)/(48-71) 125/64 (01/11 0800) SpO2:  [90 %-100 %] 95 % (01/11 0845) Arterial Line BP: (92-182)/(40-81) 129/51 (01/11 0845) FiO2 (%):  [40 %-60 %] 40 % (01/11 0800) Weight:  [96.3 kg] 96.3 kg (01/11 0421)  General - Well nourished, well developed, intubated on sedation.  Ophthalmologic - fundi not visualized due to noncooperation.  Cardiovascular - Regular rate and tachycardia.  Neuro - intubated on sedation, eyes closed, not following commands. With forced eye opening, eyes in mid position, not blinking to visual threat, doll's eyes absent, not tracking, PERRL. Corneal reflex positive on the left but absent on the right, gag and cough present. Breathing over the vent.  Facial symmetry not able to test due to ET tube.  Tongue protrusion not cooperative. On pain stimulation, BUE  withdraws left more than right, LLE no significant movement, redness on the LLE skin. RLE AKA with slight movement with pain stimulation. Sensation, coordination and gait not tested.   ASSESSMENT/PLAN Gregory [name] is a 63 y.o. male with history of DM, HTN, HLD,  left eye blind, right occipital infarct with left HH, mesenteric ischemia s/p colostomy, RLE AKA, wheelchair bound living in SNF admitted for AMS, obtundation. He was intubated for nausea vomiting and not able to protect airway. Found to have high grade fever and in septic shock. No TNK given due to time onset unclear.    Stroke:  acute on chronic right PCA infarct, secondary to chronic right PCA occlusion and hypotension in the setting of septic shock CT head New loss of  the normal gray-white matter differentiation along the inferior right temporal lobe, concerning for acute infarct. Old right occipital lobe infarct.  MRI  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 involving the right temporal occipital region. Transcranial Doppler pending Carotid Doppler  pending 2D Echo  pending LDL unable to calculate due to elevated triglycerides 521  HgbA1c 6.4 UDS neg Heparin  subq for VTE prophylaxis aspirin  81 mg daily prior to admission, now on aspirin  81 mg daily.  Ongoing aggressive stroke risk factor management Therapy recommendations:  pending Disposition:  discussed with daughter, full code for now, palliative care on board  Sepsis/septic shock B/l Penumonia  UTI CT chest Dense consolidation in both lower lobes with patchy opacities in the upper lobes. Findings compatible with multifocal pneumonia. UA WBC > 50 On zosyn , vanco and azithromycin   Need to rule out meningitis/encephalitis LP failed at the bedside.  Will need LP under IR - ordered On pressor support CCM on board  Respiratory failure Intubated on vent On sedation and pressor support Vent management per CCM  Hx of stroke 09/2022 subacute right occipital infarct due to right PCA occlusion with severe vasculopathy. MRA occlusion of the distal right P1/proximal right P2 segment, without evidence of reconstitution of the right PCA branches. No DVT, EF 55-60%, A1C 6.9 and THC positive in urine. Put on ASA 81 but no plavix due  to GIB and chronic anemia. Also on statin and tricor   Followed at [facility] in 11/2022  Diabetes HgbA1c 6.4 goal < 7.0 Controlled CBG monitoring SSI DM education and close PCP follow up  Hx of hypertension hypotension Low BP on presentation consistent with septic shock On pressure now Stable with pressure Stroke likely due to hypotension in the setting of right PCA occlusion Long term BP goal normotensive  Hyperlipidemia Home meds:  lipitor  80  LDL pending, goal < 70 Now on lipitor  80 Continue statin at discharge  Tobacco abuse Current smoker Smoking cessation counseling will be provided  Other Stroke Risk Factors PVD with RLE AKA Hx of substance abuse - THC  Other Active Problems Left eye blind since 2013 Mesenteric ischemia s/p colostomy 12/2022 with septic shock Full code for now  Hospital day # 2 Patient with prior history of right PCA infarct due to right PCA occlusion and diffuse intracranial atherosclerosis with history of recurrent GI bleed and not a good candidate for dual antiplatelet therapy.  He presented with septic shock fever elevated white count and extension of his right PCA infarct likely due to failure of collaterals due to hypertension and diffuse intracranial atherosclerosis.  I doubt he has meningitis given rapid improvement of his fever and white count with 1 day of antibiotics.  Recommend discussed with ID whether he still needs meningitic doses of antibiotics or not.  Patient unable to be transported for an MRI of the brain due to being on multiple IV drips and she will change MRI brain to transcranial Doppler and also check carotid ultrasound.  Use aspirin  alone due to GI bleed with known to dual antiplatelet therapy.  No family available at the bedside.  Discussed with Dr. Neda critical care medicine This patient is critically ill and at significant risk of neurological worsening, death and care requires constant monitoring of vital signs,  hemodynamics,respiratory and cardiac monitoring, extensive review of multiple databases, frequent neurological assessment, discussion with family, other specialists and medical decision making of high complexity.I have made any additions or clarifications directly to the above note.This critical care time does not reflect  procedure time, or teaching time or supervisory time of PA/NP/Med Resident etc but could involve care discussion time.  I spent 30 minutes of neurocritical care time  in the care of  this patient.     Eather Popp, MD Stroke Neurology 06/29/2023 10:51 AM    To contact Stroke Continuity provider, please refer to [email or web address]. After hours, contact General Neurology

## 2023-06-29 NOTE — Progress Notes (Signed)
 LTM maint complete - no skin breakdown seen [facility] monitored, Event button test confirmed by [facility].

## 2023-06-29 NOTE — Consult Note (Signed)
 [facility]    Date of Admission:  06/27/2023   Total days of inpatient antibiotics 2        Reason for Consult: AMS    Principal Problem:   Respiratory failure [facility]) Active Problems:   Encephalopathy   Fever   Assessment: 63 year old male with history of group B strep bacteremia secondary preseptal cellulitis of 2023, COPD, CKD, mesenteric ischemia status post cholecystectomy in 12/2022, diabetes admitted with acute encephalopathy and intubated fouind to have: #Altered mental status -multifactorial in the setting of CVA and PNA #Fever//leukocytosis 2/2 #1 #Poor functional status at baseline #Rhinovirus/enterovirus positive respiratory panel - Presented with increased output from ostomy from skilled nursing facility.  On arrival WBC 27K temp of 104.  CT head showed possible right temporal lobe acute infarct.  Neurology consulted and added MRI brain, CT head with broad differential possible etiology of altered mental status including stroke versus HSV encephalitis.  LP was unsuccessful yesterday.  MRI brain confirmed CVA.  Seen by neurology today and noted that unlikely meningitis given resolution of leukocytosis 10K today and fever within 1 day of antibiotics. I agree that pt's clinical picture does not seem c.w meninngits. AMS is multifactorial in the setting of CVA/PNA.  - CT chest abdomen pelvis did show bilateral lower lobe consistent with multifocal pneumonia.  Respiratory cultures growing GNR.  Respiratory viral panel for rhinovirus/enterovirus.  Urine cultures grew ESBL E. coli patient currently on meropenem , vancomycin , acyclovir  and azithromycin . - Blood cultures from admission remain negative  Recommendations:  -Discontinue vancomycin , acyclovir , azithromycin  - Continue meropenem  and add doxy(MRSA nare+) for pneumonia.  Okay to cover with meropenem  for now as urine cultures grew ESBL E. coli and respiratory cultures are pending.  I do not  suspect UTI as patient has Foley catheter and bladder. - Follow respiratory cultures to tailor antibiotics, GNR so far. Microbiology:   Antibiotics: Vancomycin  1/8-present Pip-tazo 1/8 - 1/10 Meropenem  1/11-present Azithromycin  1/9-present Ampicillin  1/9 C Acyclovir  1/9-present  Cultures: Blood 1/8 no growth Urine 1/8 ESBL E. coli Other   HPI: Gregory [name] is a 63 y.o. male history of group C bacteremia preseptal cellulitis back in 2023, COPD, A-fib, CKD stage III, mesenteric ischemia status post colostomy on 12/2022, diabetes, heart failure preserved ejection fraction, hypertension, hyperlipidemia presented from SNF after found to be obtunded.  Patient had recently increased liquid stool from ostomy.  Intubated in the ED for airway protection.  Had a WBC 27K, temp 104 on arrival.  Admitted with acute encephalopathy. CT head showed new loss of normal gray-white matter differentiation in the right temporal lobe concerning for acute infarct.  Neurology was engaged noted that patient is extremely poor functional baseline with concern for pneumonia, UTI gastric abscess left obstruction new subacute stroke versus HSV encephalitis.  Recommended CT chest abdomen pelvis, MRI brain, EEG, LP if no other source of infection found.  MRI brain showed acute on chronic right PCA distribution infarct involving the right temporal occipital region.  CT chest abdomen pelvis showed dense consolidation of both lower lobes and patchy opacity in upper lobe compatible multifocal pneumonia. Seen by neurology today noted .es meningitis given rapid improvement of fever and white cell count in 1 day.  Recommended ID involvement if patient still has meningitis as antibiotics.  He was attempted yesterday which was unsuccessful, no CSF return. 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)    Acquired absence of right leg below knee (HCC)    Acute osteomyelitis of calcaneum, right (HCC) 10/02/2016   Acute respiratory failure (HCC)    Amputation stump infection (HCC) 08/12/2018   Anemia    Basal cell carcinoma, eyelid    Cancer (HCC)    Candidiasis 08/12/2018   CHF (congestive heart failure) (HCC)    chronic diastolic    Chronic back pain    Chronic kidney disease    stage 3    Chronic multifocal osteomyelitis (HCC)    of ankle and foot    Chronic pain syndrome    COPD (chronic obstructive pulmonary disease) (HCC)    DDD (degenerative disc disease), lumbar    Depression    major depressive disorder    Diabetes mellitus without complication (HCC)    type 2    Diabetic ulcer of toe of left foot (HCC) 10/02/2016   Difficult intubation    Difficulty in walking, not elsewhere classified    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   Foot drop, bilateral    Foot drop, right 12/27/2015   GERD (gastroesophageal reflux disease)    Herpesviral vesicular dermatitis    History of COVID-19 03/15/2022   Hyperlipidemia    Hyperlipidemia    Hypertension    Insomnia    Malignant melanoma of other parts of face (HCC)    Melanoma (HCC)    MRSA bacteremia    Muscle weakness (generalized)    Nasal congestion    Necrosis of toe (HCC) 07/08/2018   Neuromuscular dysfunction of bladder    Osteomyelitis (HCC)    Osteomyelitis (HCC)    Osteomyelitis (HCC)    right BKA   Other idiopathic peripheral autonomic neuropathy    Retention of urine, unspecified    Squamous cell carcinoma of skin    Stroke (HCC)    Unsteadiness on feet    Urinary retention    Urinary retention    Urinary tract infection    Wears dentures    Wears glasses     Social History   Tobacco Use   Smoking status: Every Day    Current packs/day: 0.50    Types: Cigarettes   Smokeless tobacco: Never   Tobacco comments:    11/19/18  Vaping Use   Vaping  status: Never Used  Substance Use Topics   Alcohol  use: No   Drug use: No    Family History  Problem Relation Age of Onset   Diabetes Mother    Heart disease Mother    Cancer Mother    Cancer Father    Bone cancer Father    Prostate cancer Father    Cancer Paternal Grandfather    Cancer Other    Diabetes Other    Scheduled Meds:   stroke: early stages of recovery book   Does not apply Once   arformoterol   15 mcg Nebulization BID   aspirin   81 mg Per Tube Daily   atorvastatin   80 mg Per Tube Daily   budesonide  (PULMICORT ) nebulizer solution  0.25 mg Nebulization BID   Chlorhexidine  Gluconate Cloth  6 each Topical Q2000   famotidine   20 mg Per Tube Daily   feeding supplement (PROSource TF20)  60 mL Per Tube BID   feeding supplement (VITAL 1.5 CAL)  1,000 mL Per Tube Q24H   heparin   5,000 Units Subcutaneous Q8H   insulin   aspart  0-6 Units Subcutaneous Q4H   lidocaine  1 %  5 mL Intradermal Once   mupirocin  ointment  1 Application Nasal BID   mouth rinse  15 mL Mouth Rinse Q2H   revefenacin   175 mcg Nebulization Daily   sodium chloride  flush  10-40 mL Intracatheter Q12H   vancomycin  variable dose per unstable renal function (pharmacist dosing)   Does not apply See admin instructions   Continuous Infusions:  sodium chloride  50 mL/hr at 06/29/23 1200   acyclovir  Stopped (06/28/23 2139)   azithromycin  Stopped (06/29/23 0456)   dexmedetomidine  (PRECEDEX ) IV infusion     fentaNYL  infusion INTRAVENOUS 200 mcg/hr (06/29/23 1200)   meropenem  (MERREM ) IV     norepinephrine  (LEVOPHED ) Adult infusion 8 mcg/min (06/29/23 1200)   PRN Meds:.acetaminophen , docusate sodium , fentaNYL  (SUBLIMAZE ) injection, mouth rinse, polyethylene glycol Allergies  Allergen Reactions   Latex Other (See Comments)    ALLERGIC, per MAR    OBJECTIVE: Blood pressure (!) 111/54, pulse 69, temperature 98.4 F (36.9 C), temperature source Bladder, resp. rate (!) 28, height 6' 0.01 (1.829 m), weight 96.3  kg, SpO2 97%.  Physical Exam Constitutional:      Comments: intubated  HENT:     Head: Normocephalic and atraumatic.     Right Ear: External ear normal.     Left Ear: External ear normal.     Nose: No congestion or rhinorrhea.     Mouth/Throat:     Mouth: Mucous membranes are moist.     Pharynx: Oropharynx is clear.  Eyes:     Extraocular Movements: Extraocular movements intact.     Conjunctiva/sclera: Conjunctivae normal.     Pupils: Pupils are equal, round, and reactive to light.  Cardiovascular:     Rate and Rhythm: Normal rate and regular rhythm.     Heart sounds: No murmur heard.    No friction rub. No gallop.  Pulmonary:     Effort: Pulmonary effort is normal.     Breath sounds: Normal breath sounds.  Abdominal:     General: Abdomen is flat. Bowel sounds are normal.     Palpations: Abdomen is soft.  Musculoskeletal:     Cervical back: Normal range of motion and neck supple.  Skin:    General: Skin is warm and dry.     Lab Results Lab Results  Component Value Date   WBC 10.0 06/29/2023   HGB 8.7 (L) 06/29/2023   HCT 27.4 (L) 06/29/2023   MCV 91.0 06/29/2023   PLT 147 (L) 06/29/2023    Lab Results  Component Value Date   CREATININE 2.41 (H) 06/29/2023   BUN 55 (H) 06/29/2023   NA 135 06/29/2023   K 4.4 06/29/2023   CL 105 06/29/2023   CO2 18 (L) 06/29/2023    Lab Results  Component Value Date   ALT 14 06/27/2023   AST 18 06/27/2023   ALKPHOS 107 06/27/2023   BILITOT 0.6 06/27/2023       Loney Stank, MD [facility] 06/29/2023, 1:18 PM I have personally spent 85 minutes involved in face-to-face and non-face-to-face activities for this patient on the day of the visit. Professional time spent includes the following activities: Preparing to see the patient (review of tests), Obtaining and/or reviewing separately obtained history (admission/discharge record), Performing a medically appropriate  examination and/or evaluation , Ordering medications/tests/procedures, referring and communicating with other health care professionals, Documenting clinical information in the EMR, Independently interpreting results (not separately reported), Communicating  results to the patient/family/caregiver, Counseling and educating the patient/family/caregiver and Care coordination (not separately reported).

## 2023-06-29 NOTE — Progress Notes (Signed)
 PT Cancellation Note  Patient Details Name: Gregory [name] MRN: [id number] DOB: 15-Jul-1960   Cancelled Treatment:    Reason Eval/Treat Not Completed: Patient not medically ready  Spoke with Nurse, recommends hold PT evaluation today. Neuro notes indicate patient lightly sedated, not following commands.  Will follow-up tomorrow or Monday to see if patient is able to participate with PT assessment. Please secure chat [facility] PT if there is an urgent need to assess.  Thank you,  Leontine Roads, PT, DPT [facility]  Rehabilitation Services Physical Therapist Office: [phone] Website: [email or web address]  Leontine GORMAN Roads 06/29/2023, 1:18 PM

## 2023-06-29 NOTE — Progress Notes (Signed)
 OT Cancellation Note  Patient Details Name: Gregory [name] MRN: [id number] DOB: 1961-02-18   Cancelled Treatment:    Reason Eval/Treat Not Completed: Other (comment) (Pt from SNF [facility]) and plan is to return to SNF. Currently intubated. Will follow up at a later time and assess if appropriate.)  [facility] 06/29/2023, 7:02 AM Kreg Sink, OT/L   Acute OT Clinical Specialist Acute Rehabilitation Services Pager [phone] Office [phone]

## 2023-06-29 NOTE — Progress Notes (Signed)
 LTM EEG discontinued - no skin breakdown at [facility].

## 2023-06-29 NOTE — Procedures (Addendum)
 Patient Name: MICHALE EMMERICH  MRN: [id number]  Epilepsy Attending: Arlin MALVA Krebs  Referring Physician/Provider: Judithe Rocky BROCKS, NP  Duration: 06/29/2023 2356 to 06/29/2023 0957   Patient history: 63yo M with ams getting eeg to evaluate for seizure   Level of alertness: comatose   AEDs during EEG study: Propofol    Technical aspects: This EEG study was done with scalp electrodes positioned according to the 10-20 International system of electrode placement. Electrical activity was reviewed with band pass filter of 1-70Hz , sensitivity of 7 uV/mm, display speed of 77mm/sec with a 60Hz  notched filter applied as appropriate. EEG data were recorded continuously and digitally stored.  Video monitoring was available and reviewed as appropriate.   Description: EEG showed continuous generalized 3 to 6 Hz theta-delta slowing. Hyperventilation and photic stimulation were not performed.      ABNORMALITY - Continuous slow, generalized   IMPRESSION: This study is suggestive of severe diffuse encephalopathy. No seizures or epileptiform discharges were seen throughout the recording.   Zela Sobieski O Ingvald Theisen

## 2023-06-29 NOTE — Progress Notes (Addendum)
 eLink Physician-Brief Progress Note Patient Name: JAKOLBY SEDIVY DOB: 1960-09-18 MRN: [id number]   Date of Service  06/29/2023  HPI/Events of Note    eICU Interventions      Resp failure on Vent  on Fent @ 250 (max 400),  Precedex  1.0 (1.2 max)  still requiring Q2 Versed  IVP PRN for agitation.  We will start versed  drip and DC precedex     Addendum  Remained agitated at tiomes Prn frequency modified D/W RN D/W RT  Addendum Vent Dyssynchrony HTN  We will give one dose of Nimbex 5 mg And follow CXR/KUB   Geri Hepler 06/29/2023, 8:46 PM

## 2023-06-30 ENCOUNTER — Inpatient Hospital Stay (HOSPITAL_COMMUNITY): Payer: Medicaid Other

## 2023-06-30 ENCOUNTER — Encounter (HOSPITAL_COMMUNITY): Payer: Self-pay | Admitting: Internal Medicine

## 2023-06-30 DIAGNOSIS — I63531 Cerebral infarction due to unspecified occlusion or stenosis of right posterior cerebral artery: Secondary | ICD-10-CM | POA: Diagnosis not present

## 2023-06-30 DIAGNOSIS — G9340 Encephalopathy, unspecified: Secondary | ICD-10-CM | POA: Diagnosis not present

## 2023-06-30 DIAGNOSIS — J9601 Acute respiratory failure with hypoxia: Secondary | ICD-10-CM | POA: Diagnosis not present

## 2023-06-30 LAB — CBC
HCT: 26.9 % — ABNORMAL LOW (ref 39.0–52.0)
Hemoglobin: 8.5 g/dL — ABNORMAL LOW (ref 13.0–17.0)
MCH: 28.8 pg (ref 26.0–34.0)
MCHC: 31.6 g/dL (ref 30.0–36.0)
MCV: 91.2 fL (ref 80.0–100.0)
Platelets: 135 10*3/uL — ABNORMAL LOW (ref 150–400)
RBC: 2.95 MIL/uL — ABNORMAL LOW (ref 4.22–5.81)
RDW: 15.3 % (ref 11.5–15.5)
WBC: 7.3 10*3/uL (ref 4.0–10.5)
nRBC: 0 % (ref 0.0–0.2)

## 2023-06-30 LAB — GLUCOSE, CAPILLARY
Glucose-Capillary: 66 mg/dL — ABNORMAL LOW (ref 70–99)
Glucose-Capillary: 67 mg/dL — ABNORMAL LOW (ref 70–99)
Glucose-Capillary: 121 mg/dL — ABNORMAL HIGH (ref 70–99)
Glucose-Capillary: 109 mg/dL — ABNORMAL HIGH (ref 70–99)
Glucose-Capillary: 111 mg/dL — ABNORMAL HIGH (ref 70–99)
Glucose-Capillary: 113 mg/dL — ABNORMAL HIGH (ref 70–99)
Glucose-Capillary: 129 mg/dL — ABNORMAL HIGH (ref 70–99)
Glucose-Capillary: 122 mg/dL — ABNORMAL HIGH (ref 70–99)

## 2023-06-30 LAB — BASIC METABOLIC PANEL
Anion gap: 10 (ref 5–15)
BUN: 52 mg/dL — ABNORMAL HIGH (ref 8–23)
CO2: 21 mmol/L — ABNORMAL LOW (ref 22–32)
Calcium: 9.1 mg/dL (ref 8.9–10.3)
Chloride: 109 mmol/L (ref 98–111)
Creatinine, Ser: 2.08 mg/dL — ABNORMAL HIGH (ref 0.61–1.24)
GFR, Estimated: 35 mL/min — ABNORMAL LOW (ref >60)
Glucose, Bld: 117 mg/dL — ABNORMAL HIGH (ref 70–99)
Potassium: 4.5 mmol/L (ref 3.5–5.1)
Sodium: 140 mmol/L (ref 135–145)

## 2023-06-30 LAB — PHOSPHORUS: Phosphorus: 3.9 mg/dL (ref 2.5–4.6)

## 2023-06-30 LAB — MAGNESIUM: Magnesium: 2.2 mg/dL (ref 1.7–2.4)

## 2023-06-30 MED ORDER — DEXTROSE 50 % IV SOLN
INTRAVENOUS | Status: AC
  Administered 2023-06-30: 25 mL
  Filled 2023-06-30: qty 50

## 2023-06-30 MED ORDER — CHLORHEXIDINE GLUCONATE CLOTH 2 % EX PADS
6.0000 | MEDICATED_PAD | Freq: Every day | CUTANEOUS | Status: DC
  Administered 2023-07-01 – 2023-07-11 (×9): 6 via TOPICAL

## 2023-06-30 MED ORDER — MIDAZOLAM HCL 2 MG/2ML IJ SOLN
2.0000 mg | INTRAMUSCULAR | Status: AC | PRN
  Administered 2023-06-30 – 2023-07-02 (×8): 2 mg via INTRAVENOUS
  Filled 2023-06-30 (×2): qty 2

## 2023-06-30 MED ORDER — VECURONIUM BROMIDE 10 MG IV SOLR
5.0000 mg | Freq: Once | INTRAVENOUS | Status: AC
  Administered 2023-06-30: 5 mg via INTRAVENOUS
  Filled 2023-06-30: qty 10

## 2023-06-30 MED ORDER — CHLORHEXIDINE GLUCONATE CLOTH 2 % EX PADS: 6.0000 | MEDICATED_PAD | Freq: Every day | CUTANEOUS | Status: DC

## 2023-06-30 MED ORDER — FENTANYL CITRATE PF 50 MCG/ML IJ SOSY
25.0000 ug | PREFILLED_SYRINGE | INTRAMUSCULAR | Status: AC | PRN
  Administered 2023-06-30 – 2023-07-01 (×7): 50 ug via INTRAVENOUS
  Administered 2023-07-01: 25 ug via INTRAVENOUS

## 2023-06-30 MED ORDER — MIDAZOLAM HCL 2 MG/2ML IJ SOLN
2.0000 mg | INTRAMUSCULAR | Status: AC | PRN
  Administered 2023-06-30 (×2): 2 mg via INTRAVENOUS

## 2023-06-30 NOTE — Plan of Care (Signed)
 Problem: Safety: Goal: Non-violent Restraint(s) 06/30/2023 1919 by Valorie Inocente NOVAK, RN Outcome: Progressing 06/30/2023 1913 by Valorie Inocente NOVAK, RN Outcome: Progressing 06/30/2023 1912 by Valorie Inocente NOVAK, RN Outcome: Progressing   Problem: Education: Goal: Ability to describe self-care measures that may prevent or decrease complications (Diabetes Survival Skills Education) will improve 06/30/2023 1919 by Valorie Inocente NOVAK, RN Outcome: Progressing 06/30/2023 1913 by Valorie Inocente NOVAK, RN Outcome: Progressing 06/30/2023 1912 by Valorie Inocente NOVAK, RN Outcome: Progressing Goal: Individualized Educational Video(s) 06/30/2023 1919 by Valorie Inocente NOVAK, RN Outcome: Progressing 06/30/2023 1913 by Valorie Inocente NOVAK, RN Outcome: Progressing 06/30/2023 1912 by Valorie Inocente NOVAK, RN Outcome: Progressing   Problem: Coping: Goal: Ability to adjust to condition or change in health will improve 06/30/2023 1919 by Valorie Inocente NOVAK, RN Outcome: Progressing 06/30/2023 1913 by Valorie Inocente NOVAK, RN Outcome: Progressing 06/30/2023 1912 by Valorie Inocente NOVAK, RN Outcome: Progressing   Problem: Fluid Volume: Goal: Ability to maintain a balanced intake and output will improve 06/30/2023 1919 by Valorie Inocente NOVAK, RN Outcome: Progressing 06/30/2023 1913 by Valorie Inocente NOVAK, RN Outcome: Progressing 06/30/2023 1912 by Valorie Inocente NOVAK, RN Outcome: Progressing   Problem: Health Behavior/Discharge Planning: Goal: Ability to identify and utilize available resources and services will improve 06/30/2023 1919 by Valorie Inocente NOVAK, RN Outcome: Progressing 06/30/2023 1913 by Valorie Inocente NOVAK, RN Outcome: Progressing 06/30/2023 1912 by Valorie Inocente NOVAK, RN Outcome: Progressing Goal: Ability to manage health-related needs will improve 06/30/2023 1919 by Valorie Inocente NOVAK, RN Outcome: Progressing 06/30/2023 1913 by Valorie Inocente NOVAK, RN Outcome: Progressing 06/30/2023 1912 by Valorie Inocente NOVAK, RN Outcome: Progressing    Problem: Metabolic: Goal: Ability to maintain appropriate glucose levels will improve 06/30/2023 1919 by Valorie Inocente NOVAK, RN Outcome: Progressing 06/30/2023 1913 by Valorie Inocente NOVAK, RN Outcome: Progressing 06/30/2023 1912 by Valorie Inocente NOVAK, RN Outcome: Progressing   Problem: Nutritional: Goal: Maintenance of adequate nutrition will improve 06/30/2023 1919 by Valorie Inocente NOVAK, RN Outcome: Progressing 06/30/2023 1913 by Valorie Inocente NOVAK, RN Outcome: Progressing 06/30/2023 1912 by Valorie Inocente NOVAK, RN Outcome: Progressing Goal: Progress toward achieving an optimal weight will improve 06/30/2023 1919 by Valorie Inocente NOVAK, RN Outcome: Progressing 06/30/2023 1913 by Valorie Inocente NOVAK, RN Outcome: Progressing 06/30/2023 1912 by Valorie Inocente NOVAK, RN Outcome: Progressing   Problem: Skin Integrity: Goal: Risk for impaired skin integrity will decrease 06/30/2023 1919 by Valorie Inocente NOVAK, RN Outcome: Progressing 06/30/2023 1913 by Valorie Inocente NOVAK, RN Outcome: Progressing 06/30/2023 1912 by Valorie Inocente NOVAK, RN Outcome: Progressing   Problem: Tissue Perfusion: Goal: Adequacy of tissue perfusion will improve 06/30/2023 1919 by Valorie Inocente NOVAK, RN Outcome: Progressing 06/30/2023 1913 by Valorie Inocente NOVAK, RN Outcome: Progressing 06/30/2023 1912 by Valorie Inocente NOVAK, RN Outcome: Progressing   Problem: Education: Goal: Knowledge of General Education information will improve Description: Including pain rating scale, medication(s)/side effects and non-pharmacologic comfort measures 06/30/2023 1919 by Valorie Inocente NOVAK, RN Outcome: Progressing 06/30/2023 1913 by Valorie Inocente NOVAK, RN Outcome: Progressing 06/30/2023 1912 by Valorie Inocente NOVAK, RN Outcome: Progressing   Problem: Health Behavior/Discharge Planning: Goal: Ability to manage health-related needs will improve 06/30/2023 1919 by Valorie Inocente NOVAK, RN Outcome: Progressing 06/30/2023 1913 by Valorie Inocente NOVAK, RN Outcome:  Progressing 06/30/2023 1912 by Valorie Inocente NOVAK, RN Outcome: Progressing   Problem: Clinical Measurements: Goal: Ability to maintain clinical measurements within normal limits will improve 06/30/2023 1919 by Valorie Inocente NOVAK, RN Outcome: Progressing 06/30/2023 1913 by Valorie Inocente NOVAK, RN Outcome: Progressing 06/30/2023 1912  by Valorie Inocente NOVAK, RN Outcome: Progressing Goal: Will remain free from infection 06/30/2023 1919 by Valorie Inocente NOVAK, RN Outcome: Progressing 06/30/2023 1913 by Valorie Inocente NOVAK, RN Outcome: Progressing 06/30/2023 1912 by Valorie Inocente NOVAK, RN Outcome: Progressing Goal: Diagnostic test results will improve 06/30/2023 1919 by Valorie Inocente NOVAK, RN Outcome: Progressing 06/30/2023 1913 by Valorie Inocente NOVAK, RN Outcome: Progressing 06/30/2023 1912 by Valorie Inocente NOVAK, RN Outcome: Progressing Goal: Respiratory complications will improve 06/30/2023 1919 by Valorie Inocente NOVAK, RN Outcome: Progressing 06/30/2023 1913 by Valorie Inocente NOVAK, RN Outcome: Progressing 06/30/2023 1912 by Valorie Inocente NOVAK, RN Outcome: Progressing Goal: Cardiovascular complication will be avoided 06/30/2023 1919 by Valorie Inocente NOVAK, RN Outcome: Progressing 06/30/2023 1913 by Valorie Inocente NOVAK, RN Outcome: Progressing 06/30/2023 1912 by Valorie Inocente NOVAK, RN Outcome: Progressing   Problem: Activity: Goal: Risk for activity intolerance will decrease 06/30/2023 1919 by Valorie Inocente NOVAK, RN Outcome: Progressing 06/30/2023 1913 by Valorie Inocente NOVAK, RN Outcome: Progressing 06/30/2023 1912 by Valorie Inocente NOVAK, RN Outcome: Progressing   Problem: Nutrition: Goal: Adequate nutrition will be maintained 06/30/2023 1919 by Valorie Inocente NOVAK, RN Outcome: Progressing 06/30/2023 1913 by Valorie Inocente NOVAK, RN Outcome: Progressing 06/30/2023 1912 by Valorie Inocente NOVAK, RN Outcome: Progressing   Problem: Coping: Goal: Level of anxiety will decrease 06/30/2023 1919 by Valorie Inocente NOVAK, RN Outcome: Progressing 06/30/2023  1913 by Valorie Inocente NOVAK, RN Outcome: Progressing 06/30/2023 1912 by Valorie Inocente NOVAK, RN Outcome: Progressing   Problem: Elimination: Goal: Will not experience complications related to bowel motility 06/30/2023 1919 by Valorie Inocente NOVAK, RN Outcome: Progressing 06/30/2023 1913 by Valorie Inocente NOVAK, RN Outcome: Progressing 06/30/2023 1912 by Valorie Inocente NOVAK, RN Outcome: Progressing Goal: Will not experience complications related to urinary retention 06/30/2023 1919 by Valorie Inocente NOVAK, RN Outcome: Progressing 06/30/2023 1913 by Valorie Inocente NOVAK, RN Outcome: Progressing 06/30/2023 1912 by Valorie Inocente NOVAK, RN Outcome: Progressing   Problem: Pain Management: Goal: General experience of comfort will improve 06/30/2023 1919 by Valorie Inocente NOVAK, RN Outcome: Progressing 06/30/2023 1913 by Valorie Inocente NOVAK, RN Outcome: Progressing 06/30/2023 1912 by Valorie Inocente NOVAK, RN Outcome: Progressing   Problem: Safety: Goal: Ability to remain free from injury will improve 06/30/2023 1919 by Valorie Inocente NOVAK, RN Outcome: Progressing 06/30/2023 1913 by Valorie Inocente NOVAK, RN Outcome: Progressing 06/30/2023 1912 by Valorie Inocente NOVAK, RN Outcome: Progressing   Problem: Skin Integrity: Goal: Risk for impaired skin integrity will decrease 06/30/2023 1919 by Valorie Inocente NOVAK, RN Outcome: Progressing 06/30/2023 1913 by Valorie Inocente NOVAK, RN Outcome: Progressing 06/30/2023 1912 by Valorie Inocente NOVAK, RN Outcome: Progressing   Problem: Education: Goal: Knowledge of disease or condition will improve 06/30/2023 1919 by Valorie Inocente NOVAK, RN Outcome: Progressing 06/30/2023 1913 by Valorie Inocente NOVAK, RN Outcome: Progressing 06/30/2023 1912 by Valorie Inocente NOVAK, RN Outcome: Progressing Goal: Knowledge of secondary prevention will improve (MUST DOCUMENT ALL) 06/30/2023 1919 by Valorie Inocente NOVAK, RN Outcome: Progressing 06/30/2023 1913 by Valorie Inocente NOVAK, RN Outcome: Progressing 06/30/2023 1912 by Valorie Inocente NOVAK,  RN Outcome: Progressing Goal: Knowledge of patient specific risk factors will improve Alonso N/A or DELETE if not current risk factor) 06/30/2023 1919 by Valorie Inocente NOVAK, RN Outcome: Progressing 06/30/2023 1913 by Valorie Inocente NOVAK, RN Outcome: Progressing 06/30/2023 1912 by Valorie Inocente NOVAK, RN Outcome: Progressing   Problem: Ischemic Stroke/TIA Tissue Perfusion: Goal: Complications of ischemic stroke/TIA will be minimized 06/30/2023 1919 by Valorie Inocente NOVAK, RN Outcome: Progressing 06/30/2023 1913 by Valorie Inocente NOVAK, RN Outcome:  Progressing 06/30/2023 1912 by Valorie Inocente NOVAK, RN Outcome: Progressing   Problem: Coping: Goal: Will verbalize positive feelings about self 06/30/2023 1919 by Valorie Inocente NOVAK, RN Outcome: Progressing 06/30/2023 1913 by Valorie Inocente NOVAK, RN Outcome: Progressing 06/30/2023 1912 by Valorie Inocente NOVAK, RN Outcome: Progressing Goal: Will identify appropriate support needs 06/30/2023 1919 by Valorie Inocente NOVAK, RN Outcome: Progressing 06/30/2023 1913 by Valorie Inocente NOVAK, RN Outcome: Progressing 06/30/2023 1912 by Valorie Inocente NOVAK, RN Outcome: Progressing   Problem: Health Behavior/Discharge Planning: Goal: Ability to manage health-related needs will improve 06/30/2023 1919 by Valorie Inocente NOVAK, RN Outcome: Progressing 06/30/2023 1913 by Valorie Inocente NOVAK, RN Outcome: Progressing 06/30/2023 1912 by Valorie Inocente NOVAK, RN Outcome: Progressing Goal: Goals will be collaboratively established with patient/family 06/30/2023 1919 by Valorie Inocente NOVAK, RN Outcome: Progressing 06/30/2023 1913 by Valorie Inocente NOVAK, RN Outcome: Progressing 06/30/2023 1912 by Valorie Inocente NOVAK, RN Outcome: Progressing   Problem: Self-Care: Goal: Ability to participate in self-care as condition permits will improve 06/30/2023 1919 by Valorie Inocente NOVAK, RN Outcome: Progressing 06/30/2023 1913 by Valorie Inocente NOVAK, RN Outcome: Progressing 06/30/2023 1912 by Valorie Inocente NOVAK, RN Outcome:  Progressing Goal: Verbalization of feelings and concerns over difficulty with self-care will improve 06/30/2023 1919 by Valorie Inocente NOVAK, RN Outcome: Progressing 06/30/2023 1913 by Valorie Inocente NOVAK, RN Outcome: Progressing 06/30/2023 1912 by Valorie Inocente NOVAK, RN Outcome: Progressing Goal: Ability to communicate needs accurately will improve 06/30/2023 1919 by Valorie Inocente NOVAK, RN Outcome: Progressing 06/30/2023 1913 by Valorie Inocente NOVAK, RN Outcome: Progressing 06/30/2023 1912 by Valorie Inocente NOVAK, RN Outcome: Progressing   Problem: Nutrition: Goal: Risk of aspiration will decrease 06/30/2023 1919 by Valorie Inocente NOVAK, RN Outcome: Progressing 06/30/2023 1913 by Valorie Inocente NOVAK, RN Outcome: Progressing 06/30/2023 1912 by Valorie Inocente NOVAK, RN Outcome: Progressing Goal: Dietary intake will improve 06/30/2023 1919 by Valorie Inocente NOVAK, RN Outcome: Progressing 06/30/2023 1913 by Valorie Inocente NOVAK, RN Outcome: Progressing 06/30/2023 1912 by Valorie Inocente NOVAK, RN Outcome: Progressing   Problem: Activity: Goal: Ability to tolerate increased activity will improve 06/30/2023 1919 by Valorie Inocente NOVAK, RN Outcome: Progressing 06/30/2023 1913 by Valorie Inocente NOVAK, RN Outcome: Progressing   Problem: Respiratory: Goal: Ability to maintain a clear airway and adequate ventilation will improve 06/30/2023 1919 by Valorie Inocente NOVAK, RN Outcome: Progressing 06/30/2023 1913 by Valorie Inocente NOVAK, RN Outcome: Progressing   Problem: Role Relationship: Goal: Method of communication will improve 06/30/2023 1919 by Valorie Inocente NOVAK, RN Outcome: Progressing 06/30/2023 1913 by Valorie Inocente NOVAK, RN Outcome: Progressing

## 2023-06-30 NOTE — Progress Notes (Signed)
 STROKE TEAM PROGRESS NOTE   SUBJECTIVE (INTERVAL HISTORY) His RN is at the bedside. Pt remains intubated sedated and paralyzed.  He had ventilator dyssynchrony last night requiring to be paralyzed. Neurological exam is limited due to sedation and paralysis.  Vital signs stable.  OBJECTIVE Temp:  [97.9 F (36.6 C)-101 F (38.3 C)] 98.9 F (37.2 C) (01/12 1123) Pulse Rate:  [57-121] 87 (01/12 1100) Cardiac Rhythm: Normal sinus rhythm (01/12 0800) Resp:  [14-39] 25 (01/12 1100) BP: (90-204)/(49-128) 110/93 (01/12 1100) SpO2:  [92 %-100 %] 94 % (01/12 1100) Arterial Line BP: (44-189)/(37-153) 55/46 (01/11 2200) FiO2 (%):  [40 %] 40 % (01/12 1113) Weight:  [96.5 kg] 96.5 kg (01/12 0105)  Recent Labs  Lab 06/30/23 0326 06/30/23 0329 06/30/23 0401 06/30/23 0709 06/30/23 1117  GLUCAP 67* 66* 121* 109* 111*   Recent Labs  Lab 06/27/23 1352 06/28/23 0305 06/28/23 0306 06/28/23 0803 06/28/23 1941 06/29/23 0403 06/30/23 0358  NA 136 133* 134* 135 133* 135 140  K 4.3 5.7* 5.8* 5.1 4.5 4.4 4.5  CL 104 104  --   --  104 105 109  CO2 21* 18*  --   --  18* 18* 21*  GLUCOSE 102* 139*  --   --  158* 124* 117*  BUN 50* 53*  --   --  54* 55* 52*  CREATININE 2.82* 2.62*  --   --  2.56* 2.41* 2.08*  CALCIUM  8.1* 8.1*  --   --  8.3* 8.4* 9.1  MG  --  1.5*  --   --   --  2.3 2.2  PHOS  --  5.3*  --   --   --  4.5 3.9   Recent Labs  Lab 06/27/23 0558  AST 18  ALT 14  ALKPHOS 107  BILITOT 0.6  PROT 7.0  ALBUMIN  3.1*   Recent Labs  Lab 06/27/23 0558 06/27/23 0838 06/28/23 0305 06/28/23 0306 06/28/23 0803 06/29/23 0403 06/30/23 0358  WBC 13.0*  --  27.5*  --   --  10.0 7.3  NEUTROABS 11.0*  --   --   --   --   --   --   HGB 11.7*   < > 10.9* 11.2* 10.5* 8.7* 8.5*  HCT 38.1*   < > 35.1* 33.0* 31.0* 27.4* 26.9*  MCV 95.0  --  92.6  --   --  91.0 91.2  PLT 311  --  280  --   --  147* 135*   < > = values in this interval not displayed.   No results for input(s): CKTOTAL,  CKMB, CKMBINDEX, TROPONINI in the last 168 hours. No results for input(s): LABPROT, INR in the last 72 hours.  Recent Labs    06/27/23 1411  COLORURINE GREEN*  LABSPEC TEST NOT REPORTED DUE TO COLOR INTERFERENCE OF URINE PIGMENT  PHURINE TEST NOT REPORTED DUE TO COLOR INTERFERENCE OF URINE PIGMENT  GLUCOSEU TEST NOT REPORTED DUE TO COLOR INTERFERENCE OF URINE PIGMENT*  HGBUR TEST NOT REPORTED DUE TO COLOR INTERFERENCE OF URINE PIGMENT*  BILIRUBINUR TEST NOT REPORTED DUE TO COLOR INTERFERENCE OF URINE PIGMENT*  KETONESUR TEST NOT REPORTED DUE TO COLOR INTERFERENCE OF URINE PIGMENT*  PROTEINUR TEST NOT REPORTED DUE TO COLOR INTERFERENCE OF URINE PIGMENT*  NITRITE TEST NOT REPORTED DUE TO COLOR INTERFERENCE OF URINE PIGMENT*  LEUKOCYTESUR TEST NOT REPORTED DUE TO COLOR INTERFERENCE OF URINE PIGMENT*       Component Value Date/Time   CHOL 107 06/29/2023 0403  TRIG 521 (H) 06/29/2023 0403   HDL <10 (L) 06/29/2023 0403   CHOLHDL NOT CALCULATED 06/29/2023 0403   VLDL UNABLE TO CALCULATE IF TRIGLYCERIDE OVER 400 mg/dL 98/88/7974 9596   LDLCALC UNABLE TO CALCULATE IF TRIGLYCERIDE OVER 400 mg/dL 98/88/7974 9596   Lab Results  Component Value Date   HGBA1C 6.4 (H) 06/27/2023      Component Value Date/Time   LABOPIA NONE DETECTED 06/27/2023 0748   COCAINSCRNUR NONE DETECTED 06/27/2023 0748   LABBENZ NONE DETECTED 06/27/2023 0748   AMPHETMU NONE DETECTED 06/27/2023 0748   THCU NONE DETECTED 06/27/2023 0748   LABBARB NONE DETECTED 06/27/2023 0748    No results for input(s): ETH in the last 168 hours.  I have personally reviewed the radiological images below and agree with the radiology interpretations.  DG Abd 1 View Result Date: 06/30/2023 CLINICAL DATA:  Ileus. EXAM: ABDOMEN - 1 VIEW COMPARISON:  06/27/2023 FINDINGS: The marked gaseous dilatation of the stomach seen on the previous study has resolved in the interval. No gaseous small bowel or colonic dilatation on the  current study. NG tube tip is in the stomach with proximal side port below the GE junction. Rectal temperature probe overlies the low pelvis. IMPRESSION: Interval resolution of gaseous dilatation of the stomach. No gaseous small bowel or colonic dilatation on the current study. Electronically Signed   By: Camellia Candle M.D.   On: 06/30/2023 06:34   DG CHEST PORT 1 VIEW Result Date: 06/30/2023 CLINICAL DATA:  63 year old male with respiratory failure, ventilator dependent. EXAM: PORTABLE CHEST 1 VIEW COMPARISON:  Portable chest 06/28/2023 and earlier. FINDINGS: Portable AP semi upright view at 0421 hours. Endotracheal tube tip in good position between the clavicles and carina. Stable right IJ central line. Enteric tube loops in the stomach. Lung volumes and mediastinal contours are stable and within normal limits. Mildly rotated to the left. Partially visible cervical spine fusion hardware. Regressed bilateral lower lung consolidation, perihilar opacity since 06/27/2023. No areas of worsening ventilation since that time. No pneumothorax or pleural effusion. No pulmonary edema. Paucity of bowel gas.  Stable visualized osseous structures. IMPRESSION: 1.  Stable lines and tubes. 2. Regressed bilateral consolidation/pneumonia since 06/27/2023. Bilateral improved ventilation. No new cardiopulmonary abnormality. Electronically Signed   By: VEAR Hurst M.D.   On: 06/30/2023 05:42   ECHOCARDIOGRAM COMPLETE Result Date: 06/28/2023    ECHOCARDIOGRAM REPORT   Patient Name:   Gregory [name] Date of Exam: 06/28/2023 Medical Rec #:  [id number]    Height:       72.0 in Accession #:    [id number]   Weight:       212.5 lb Date of Birth:  03-Dec-1960   BSA:          2.186 m Patient Age:    62 years     BP:           101/58 mmHg Patient Gender: M            HR:           90 bpm. Exam Location:  Inpatient Procedure: 2D Echo, Cardiac Doppler and Color Doppler Indications:    Stroke I63.9  History:        Patient has prior history of  Echocardiogram examinations, most                 recent 12/06/2022. CHF, COPD, Stroke and CKD, stage 3,  Signs/Symptoms:Shortness of Breath; Risk Factors:Hypertension,                 Diabetes, Current Smoker and Dyslipidemia.  Sonographer:    Thea Norlander RCS Referring Phys: [id number] ASHISH ARORA IMPRESSIONS  1. Left ventricular ejection fraction, by estimation, is 65 to 70%. The left ventricle has hyperdynamic function. The left ventricle has no regional wall motion abnormalities. Left ventricular diastolic parameters were normal.  2. Right ventricular systolic function is normal. The right ventricular size is mildly enlarged. Tricuspid regurgitation signal is inadequate for assessing PA pressure.  3. The mitral valve is normal in structure. No evidence of mitral valve regurgitation. No evidence of mitral stenosis.  4. The aortic valve is normal in structure. There is mild calcification of the aortic valve. There is mild thickening of the aortic valve. Aortic valve regurgitation is mild. Aortic valve sclerosis/calcification is present, without any evidence of aortic stenosis.  5. The inferior vena cava is normal in size with <50% respiratory variability, suggesting right atrial pressure of 8 mmHg. FINDINGS  Left Ventricle: Left ventricular ejection fraction, by estimation, is 65 to 70%. The left ventricle has hyperdynamic function. The left ventricle has no regional wall motion abnormalities. The left ventricular internal cavity size was small. There is no  left ventricular hypertrophy. Left ventricular diastolic parameters were normal. Right Ventricle: The right ventricular size is mildly enlarged. No increase in right ventricular wall thickness. Right ventricular systolic function is normal. Tricuspid regurgitation signal is inadequate for assessing PA pressure. Left Atrium: Left atrial size was normal in size. Right Atrium: Right atrial size was normal in size. Pericardium: There is no  evidence of pericardial effusion. Mitral Valve: The mitral valve is normal in structure. No evidence of mitral valve regurgitation. No evidence of mitral valve stenosis. Tricuspid Valve: The tricuspid valve is normal in structure. Tricuspid valve regurgitation is trivial. No evidence of tricuspid stenosis. Aortic Valve: The aortic valve is normal in structure. There is mild calcification of the aortic valve. There is mild thickening of the aortic valve. Aortic valve regurgitation is mild. Aortic valve sclerosis/calcification is present, without any evidence of aortic stenosis. Aortic valve mean gradient measures 10.0 mmHg. Aortic valve peak gradient measures 20.2 mmHg. Aortic valve area, by VTI measures 2.25 cm. Pulmonic Valve: The pulmonic valve was normal in structure. Pulmonic valve regurgitation is not visualized. No evidence of pulmonic stenosis. Aorta: The aortic root and ascending aorta are structurally normal, with no evidence of dilitation. Venous: The inferior vena cava is normal in size with less than 50% respiratory variability, suggesting right atrial pressure of 8 mmHg. IAS/Shunts: No atrial level shunt detected by color flow Doppler.  LEFT VENTRICLE PLAX 2D LVIDd:         5.00 cm   Diastology LVIDs:         3.60 cm   LV e' medial:    7.72 cm/s LV PW:         1.00 cm   LV E/e' medial:  9.5 LV IVS:        0.80 cm   LV e' lateral:   12.80 cm/s LVOT diam:     2.40 cm   LV E/e' lateral: 5.7 LV SV:         74 LV SV Index:   34 LVOT Area:     4.52 cm  RIGHT VENTRICLE             IVC RV S prime:     14.80 cm/s  IVC diam: 2.80 cm TAPSE (M-mode): 2.9 cm LEFT ATRIUM             Index        RIGHT ATRIUM           Index LA diam:        3.60 cm 1.65 cm/m   RA Area:     15.90 cm LA Vol (A2C):   28.8 ml 13.17 ml/m  RA Volume:   41.60 ml  19.03 ml/m LA Vol (A4C):   20.3 ml 9.29 ml/m LA Biplane Vol: 24.3 ml 11.11 ml/m  AORTIC VALVE AV Area (Vmax):    2.08 cm AV Area (Vmean):   2.16 cm AV Area (VTI):     2.25  cm AV Vmax:           224.50 cm/s AV Vmean:          144.500 cm/s AV VTI:            0.329 m AV Peak Grad:      20.2 mmHg AV Mean Grad:      10.0 mmHg LVOT Vmax:         103.33 cm/s LVOT Vmean:        69.033 cm/s LVOT VTI:          0.163 m LVOT/AV VTI ratio: 0.50  AORTA Ao Root diam: 3.40 cm Ao Asc diam:  3.40 cm MITRAL VALVE               TRICUSPID VALVE MV Area (PHT): 4.21 cm    TR Peak grad:   11.6 mmHg MV Decel Time: 180 msec    TR Vmax:        170.00 cm/s MV E velocity: 73.50 cm/s MV A velocity: 72.70 cm/s  SHUNTS MV E/A ratio:  1.01        Systemic VTI:  0.16 m                            Systemic Diam: 2.40 cm Morene Brownie Electronically signed by Morene Brownie Signature Date/Time: 06/28/2023/2:33:49 PM    Final    Overnight EEG with video Result Date: 06/28/2023 Shelton Arlin KIDD, MD     06/29/2023  9:12 AM Patient Name: Gregory [name] MRN: [id number] Epilepsy Attending: Arlin KIDD Shelton Referring Physician/Provider: Judithe Rocky BROCKS, NP Duration: 06/27/2023 2356 to 06/29/2023 0200 Patient history: 63yo M with ams getting eeg to evaluate for seizure Level of alertness: comatose AEDs during EEG study: Propofol  Technical aspects: This EEG study was done with scalp electrodes positioned according to the 10-20 International system of electrode placement. Electrical activity was reviewed with band pass filter of 1-70Hz , sensitivity of 7 uV/mm, display speed of 33mm/sec with a 60Hz  notched filter applied as appropriate. EEG data were recorded continuously and digitally stored.  Video monitoring was available and reviewed as appropriate. Description: EEG showed continuous generalized 3 to 6 Hz theta-delta slowing. Hyperventilation and photic stimulation were not performed.   EEG was disconnected between 06/28/2023 1436 to 1639 ABNORMALITY - Continuous slow, generalized IMPRESSION: This study is suggestive of severe diffuse encephalopathy. No seizures or epileptiform discharges were seen throughout the recording.  Arlin KIDD Shelton   DG Chest Port 1 View Result Date: 06/28/2023 CLINICAL DATA:  [id number]. Encounter for pneumonia. Ventilator dependent respiratory failure. EXAM: PORTABLE CHEST 1 VIEW COMPARISON:  Chest CT without contrast yesterday at 7:51 p.m. FINDINGS: 3:56 a.m. ETT tip is 5  cm from the carina, NGT curves around to the left in the stomach with the tip in the upper medial lumen. Right IJ central line tip at the superior cavoatrial junction. Widespread right-greater-than-left airspace disease is again noted, with relative sparing in the left upper lung field. No pleural effusion is seen. There is mild cardiomegaly, mild central vascular prominence. Stable mediastinum. There is aortic atherosclerosis.  No acute or new osseous findings. IMPRESSION: 1. Widespread right-greater-than-left airspace disease, with relative sparing in the left upper lung field. No new or worsening infiltrate. 2. Mild cardiomegaly with mild central vascular prominence. 3. Support apparatus as above. 4. Aortic atherosclerosis. Electronically Signed   By: Francis Quam M.D.   On: 06/28/2023 05:58   MR BRAIN WO CONTRAST Result Date: 06/27/2023 CLINICAL DATA:  Initial evaluation for acute neuro deficit, stroke suspected. EXAM: MRI HEAD WITHOUT CONTRAST TECHNIQUE: Multiplanar, multiecho pulse sequences of the brain and surrounding structures were obtained without intravenous contrast. COMPARISON:  Prior study from earlier the same day. FINDINGS: Brain: Age-related cerebral atrophy. Patchy and confluent T2/FLAIR hyperintensity involving the periventricular deep white matter both cerebral hemispheres, consistent with chronic small vessel ischemic disease, moderately advanced. Patchy involvement of the pons. Encephalomalacia and gliosis involving the right occipital lobe, consistent with a chronic left PCA distribution infarct. Associated chronic hemosiderin staining within this region. Superimposed restricted diffusion within the right  temporal occipital region, consistent with an acute on chronic right PCA distribution infarct. Patchy involvement of the right thalamus. No associated hemorrhage or significant regional mass effect. No other evidence for acute or subacute ischemia. No acute intracranial hemorrhage. Chronic microhemorrhage at the anterior right temporal lobe noted. No mass lesion, midline shift or mass effect. No hydrocephalus or extra-axial fluid collection. Pituitary gland suprasellar region within normal limits. Vascular: Major intracranial vascular flow voids are maintained. Skull and upper cervical spine: Cranial junctional is. Postoperative changes partially visualize within the cervical spine. Bone marrow signal intensity normal. No scalp soft tissue abnormality. Sinuses/Orbits: Globes orbital soft tissues within normal limits. Mucosal thickening noted about the sphenoid ethmoidal and maxillary sinuses. Superimposed air-fluid level within the right maxillary sinus. Small bilateral mastoid effusions. Patient is intubated. Other: None. IMPRESSION: 1. 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 No associated hemorrhage or significant regional mass effect. 2. Underlying age-related cerebral atrophy with moderate chronic microvascular ischemic disease. Electronically Signed   By: Morene Hoard M.D.   On: 06/27/2023 21:28   CT CHEST ABDOMEN PELVIS WO CONTRAST Result Date: 06/27/2023 CLINICAL DATA:  Pneumonia, sepsis EXAM: CT CHEST, ABDOMEN AND PELVIS WITHOUT CONTRAST TECHNIQUE: Multidetector CT imaging of the chest, abdomen and pelvis was performed following the standard protocol without IV contrast. RADIATION DOSE REDUCTION: This exam was performed according to the departmental dose-optimization program which includes automated exposure control, adjustment of the mA and/or kV according to patient size and/or use of iterative reconstruction technique. COMPARISON:  CT  abdomen and pelvis 02/08/2023. FINDINGS: CT CHEST FINDINGS Cardiovascular: Heart is normal size. Aorta is normal caliber. Moderate coronary artery and aortic calcifications. Mediastinum/Nodes: No mediastinal, hilar, or axillary adenopathy. Trachea and esophagus are unremarkable. Endotracheal tube tip in the midtrachea. Thyroid unremarkable. Lungs/Pleura: Dense consolidation in both lower lobes compatible with pneumonia. No effusions. Patchy ground-glass opacities in the upper lobes as well. Musculoskeletal: Chest wall soft tissues are unremarkable. No acute bony abnormality. CT ABDOMEN PELVIS FINDINGS Hepatobiliary: Small layering gallstones within the gallbladder. No focal hepatic abnormality or biliary ductal dilatation. Pancreas: No focal  abnormality or ductal dilatation. Spleen: No focal abnormality.  Normal size. Adrenals/Urinary Tract: Foley catheter in the bladder which is decompressed. No renal or adrenal mass. No stones or hydronephrosis. Stomach/Bowel: Prior colectomy. Right lower quadrant ostomy noted. No bowel obstruction. NG tube is in the stomach. Vascular/Lymphatic: Aortic atherosclerosis. No evidence of aneurysm or adenopathy. Reproductive: Mildly prominent prostate. Other: No free fluid or free air. Musculoskeletal: No acute bony abnormality. IMPRESSION: Dense consolidation in both lower lobes with patchy opacities in the upper lobes. Findings compatible with multifocal pneumonia. Coronary artery disease, aortic atherosclerosis. Right lower quadrant ostomy.  No evidence of bowel obstruction. Cholelithiasis.  No CT evidence for acute cholecystitis. Electronically Signed   By: Franky Crease M.D.   On: 06/27/2023 20:01   DG CHEST PORT 1 VIEW Result Date: 06/27/2023 CLINICAL DATA:  Central line placement. EXAM: PORTABLE CHEST 1 VIEW COMPARISON:  Same day. FINDINGS: Endotracheal and nasogastric tubes are unchanged. Interval placement of right internal jugular catheter with distal tip in expected  position of the SVC. No pneumothorax is noted. Stable diffuse right lung interstitial opacity is noted concerning for asymmetric edema or atypical inflammation. IMPRESSION: Interval placement of right internal jugular catheter with distal tip in expected position of the SVC. Electronically Signed   By: Lynwood Landy Raddle M.D.   On: 06/27/2023 12:40   CT HEAD WO CONTRAST ( ) Result Date: 06/27/2023 CLINICAL DATA:  Altered mental status. EXAM: CT HEAD WITHOUT CONTRAST TECHNIQUE: Contiguous axial images were obtained from the base of the skull through the vertex without intravenous contrast. RADIATION DOSE REDUCTION: This exam was performed according to the departmental dose-optimization program which includes automated exposure control, adjustment of the mA and/or kV according to patient size and/or use of iterative reconstruction technique. COMPARISON:  CT head dated February 08, 2023. FINDINGS: Brain: New loss of the normal gray-white matter differentiation along the inferior right temporal lobe (series 3, image 14; series 5, images 35-42). No evidence of hemorrhage, hydrocephalus, or extra-axial collection. Old right occipital lobe infarct again noted. Vascular: Calcified atherosclerosis at the skull base. No hyperdense vessel. Skull: Normal. Negative for fracture or focal lesion. Sinuses/Orbits: No acute finding. Other: Similar postsurgical changes right pre maxillary soft tissues. IMPRESSION: 1. New loss of the normal gray-white matter differentiation along the inferior right temporal lobe, concerning for acute infarct. No hemorrhage. Consider further evaluation with MRI. 2. Old right occipital lobe infarct. Electronically Signed   By: Elsie ONEIDA Shoulder M.D.   On: 06/27/2023 11:00   DG Chest Port 1 View Result Date: 06/27/2023 CLINICAL DATA:  Ventilator dependent respiratory failure. [id number] encounter for orogastric tube placement. EXAM: PORTABLE ABDOMEN - 1 VIEW; PORTABLE CHEST - 1 VIEW COMPARISON:  Portable chest  today at 6:10 a.m., abdomen and pelvis CT 02/08/2023 FINDINGS: Chest AP portable 6:48 a.m.: Interval intubation with tip of the ETT 4.6 cm from carina. NGT interval insertion. The heart is enlarged. There is worsening central vascular congestion and perihilar edema. There is increased right-greater-than-left perihilar interstitial Dyane most likely due to ground-glass edema. The more peripheral lungs are essentially clear. Small pleural effusions are forming. The mediastinum is stable.  No new osseous abnormality. Flat plate single anteverted view, portable at 6:51 a.m.: NGT curves to the left and cephalad within the stomach with the tip along the proximal fundus. Compared with CT 02/08/2023 there is severe dilatation of the stomach with air and food products. Correlate clinically for gastric outlet obstruction or impaired gastric emptying. The bowel pattern appears nonobstructive. There is  no supine evidence of free air. No other significant radiographic findings. IMPRESSION: 1. Interval intubation with tip of the ETT 4.6 cm from carina. 2. NGT curves to the left and cephalad within the stomach with the tip along the proximal fundus. 3. Compared with CT 02/08/2023 there is severe dilatation of the stomach with air and food products. Correlate clinically for gastric outlet obstruction or impaired gastric emptying. 4. Worsening central vascular congestion and perihilar edema. 5. Developing small pleural effusions. Electronically Signed   By: Francis Quam M.D.   On: 06/27/2023 07:27   DG Abd Portable 1V Result Date: 06/27/2023 CLINICAL DATA:  Ventilator dependent respiratory failure. [id number] encounter for orogastric tube placement. EXAM: PORTABLE ABDOMEN - 1 VIEW; PORTABLE CHEST - 1 VIEW COMPARISON:  Portable chest today at 6:10 a.m., abdomen and pelvis CT 02/08/2023 FINDINGS: Chest AP portable 6:48 a.m.: Interval intubation with tip of the ETT 4.6 cm from carina. NGT interval insertion. The heart is enlarged.  There is worsening central vascular congestion and perihilar edema. There is increased right-greater-than-left perihilar interstitial Dyane most likely due to ground-glass edema. The more peripheral lungs are essentially clear. Small pleural effusions are forming. The mediastinum is stable.  No new osseous abnormality. Flat plate single anteverted view, portable at 6:51 a.m.: NGT curves to the left and cephalad within the stomach with the tip along the proximal fundus. Compared with CT 02/08/2023 there is severe dilatation of the stomach with air and food products. Correlate clinically for gastric outlet obstruction or impaired gastric emptying. The bowel pattern appears nonobstructive. There is no supine evidence of free air. No other significant radiographic findings. IMPRESSION: 1. Interval intubation with tip of the ETT 4.6 cm from carina. 2. NGT curves to the left and cephalad within the stomach with the tip along the proximal fundus. 3. Compared with CT 02/08/2023 there is severe dilatation of the stomach with air and food products. Correlate clinically for gastric outlet obstruction or impaired gastric emptying. 4. Worsening central vascular congestion and perihilar edema. 5. Developing small pleural effusions. Electronically Signed   By: Francis Quam M.D.   On: 06/27/2023 07:27   DG Chest Port 1 View Result Date: 06/27/2023 CLINICAL DATA:  Unresponsive. EXAM: PORTABLE CHEST 1 VIEW COMPARISON:  02/08/2023 FINDINGS: The cardio pericardial silhouette is enlarged. Vascular congestion diffuse noted with diffuse interstitial opacity suggesting edema. No focal consolidation. No pneumothorax or pleural effusion. Gaseous distention of stomach or bowel is seen in the the left hemidiaphragm. Telemetry leads overlie the chest. IMPRESSION: Enlargement of the cardiopericardial silhouette with vascular congestion and diffuse interstitial opacity suggesting edema. Gaseous distention of stomach or bowel noted under the  left hemidiaphragm. Electronically Signed   By: Camellia Candle M.D.   On: 06/27/2023 06:32     PHYSICAL EXAM  Temp:  [97.9 F (36.6 C)-101 F (38.3 C)] 98.9 F (37.2 C) (01/12 1123) Pulse Rate:  [57-121] 87 (01/12 1100) Resp:  [14-39] 25 (01/12 1100) BP: (90-204)/(49-128) 110/93 (01/12 1100) SpO2:  [92 %-100 %] 94 % (01/12 1100) Arterial Line BP: (44-189)/(37-153) 55/46 (01/11 2200) FiO2 (%):  [40 %] 40 % (01/12 1113) Weight:  [96.5 kg] 96.5 kg (01/12 0105)  General - Well nourished, well developed, intubated on sedation.  Ophthalmologic - fundi not visualized due to noncooperation.  Cardiovascular - Regular rate and tachycardia.  Neuro - intubated sedated and paralyzed., eyes closed, not following commands. With forced eye opening, eyes in mid position, not blinking to visual threat, doll's eyes absent, not tracking,  PERRL. Corneal reflex positive on the left but absent on the right, gag and cough present. Breathing over the vent.  Facial symmetry not able to test due to ET tube.   On pain stimulation, BUE  withdraws left more than right, LLE no significant movement, redness on the LLE skin. RLE AKA with slight movement with pain stimulation. Sensation, coordination and gait not tested.   ASSESSMENT/PLAN Gregory [name] is a 63 y.o. male with history of DM, HTN, HLD, left eye blind, right occipital infarct with left HH, mesenteric ischemia s/p colostomy, RLE AKA, wheelchair bound living in SNF admitted for AMS, obtundation. He was intubated for nausea vomiting and not able to protect airway. Found to have high grade fever and in septic shock. No TNK given due to time onset unclear.    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 Old right occipital lobe infarct.   MRI  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 involving the right temporal occipital region. Transcranial Doppler pending Carotid Doppler  pending 2D Echo   ejection fraction 65 to 70% with hyperdynamic LV function.  Left atrium size normal LDL unable to calculate due to elevated triglycerides 521  HgbA1c 6.4 UDS neg Heparin  subq for VTE prophylaxis aspirin  81 mg daily prior to admission, now on aspirin  81 mg daily.  Ongoing aggressive stroke risk factor management Therapy recommendations:  pending Disposition:   as per  daughter, full code for now, palliative care on board  Sepsis/septic shock B/l Penumonia  UTI CT chest Dense consolidation in both lower lobes with patchy opacities in the upper lobes. Findings compatible with multifocal pneumonia. UA WBC > 50 On zosyn , vanco and azithromycin   Need to rule out meningitis/encephalitis LP failed at the bedside.  Will need LP under IR - ordered On pressor support CCM on board  Respiratory failure Intubated on vent On sedation and pressor support Vent management per CCM  Hx of stroke 09/2022 subacute right occipital infarct due to right PCA occlusion with severe vasculopathy. MRA occlusion of the distal right P1/proximal right P2 segment, without evidence of reconstitution of the right PCA branches. No DVT, EF 55-60%, A1C 6.9 and THC positive in urine. Put on ASA 81 but no plavix due to GIB and chronic anemia. Also on statin and tricor   [facility] at [facility] in 11/2022  Diabetes HgbA1c 6.4 goal < 7.0 Controlled CBG monitoring SSI DM education and close PCP follow up  Hx of hypertension hypotension Low BP on presentation consistent with septic shock On pressure now Stable with pressure Stroke likely due to hypotension in the setting of right PCA occlusion Long term BP goal normotensive  Hyperlipidemia Home meds:  lipitor  80  LDL pending, goal < 70 Now on lipitor  80 Continue statin at discharge  Tobacco  abuse Current smoker Smoking cessation counseling will be provided  Other Stroke Risk Factors PVD with RLE AKA Hx of substance abuse - THC  Other Active Problems Left eye blind since 2013 Mesenteric ischemia s/p colostomy 12/2022 with septic shock Full code for now  Hospital day # 3 Patient with prior history of right PCA infarct due to right PCA occlusion and diffuse intracranial atherosclerosis with history of recurrent GI bleed and not a good candidate for dual antiplatelet therapy.  He presented with septic  shock fever elevated white count and extension of his right PCA infarct likely due to failure of collaterals due to hypotension and diffuse intracranial atherosclerosis.  I doubt he has meningitis given rapid improvement of his fever and white count with 1 day of antibiotics.   ID agree and have discontinued vancomycin , acyclovir  and azithromycin .  He is currently on meropenem  and doxycycline  has been added for pneumonia.  We are still awaiting transcranial Doppler and carotid ultrasound.  Use aspirin  alone due to GI bleed with known to dual antiplatelet therapy.  No family available at the bedside.  Patient prognosis is quite poor and consider palliative care options.  Discussed with Dr. Neda critical care medicine.  Awaits daughter's decision after discussion with palliative care and critical care team on goals of care   This patient is critically ill and at significant risk of neurological worsening, death and care requires constant monitoring of vital signs, hemodynamics,respiratory and cardiac monitoring, extensive review of multiple databases, frequent neurological assessment, discussion with family, other specialists and medical decision making of high complexity.I have made any additions or clarifications directly to the above note.This critical care time does not reflect procedure time, or teaching time or supervisory time of PA/NP/Med Resident etc but could involve care discussion  time.  I spent 30 minutes of neurocritical care time  in the care of  this patient.        Eather Popp, MD Stroke Neurology 06/30/2023 12:08 PM    To contact Stroke Continuity provider, please refer to [email or web address]. After hours, contact General Neurology

## 2023-06-30 NOTE — Progress Notes (Signed)
 Palliative Medicine Progress Note   Patient Name: Gregory [name]       Date: 06/30/2023 DOB: July 16, 1960  Age: 63 y.o. MRN#: [id number] Attending Physician: Neda Jennet LABOR, MD Primary Care Physician: [name], Provider, MD Admit Date: 06/27/2023   HPI/Patient Profile: 63 y.o. 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  He was intubated in the ED for airway protection.  Initial workup revealed elevated creatinine and mild leukocytosis.  CT head was abnormal with concern for acute infarct.  He is admitted with acute encephalopathy, right sided pneumonia, and sepsis. MRI brain on 1/9 showed acute on chronic right PCA distribution infarct involving the right temporal occipital region.   Palliative Medicine was consulted for goals of care.  Subjective: Chart reviewed and update received from RN. Patient had a trial off sedation today; he did not wake up or follow commands but became tachypneic and asynchronous with ventilator.  I spoke with daughter/[name] by phone and provided updates on patient's condition as per above.  Reviewed results of MRI. Discussed that per neurology, prognosis is poor. [name] fully understands, but wants to allow additional time for any potential neurologic recovery.   We discussed the concept of meaningful recovery - [name] is clear her father would not want to be prolonged on life support if he were in a persistent vegetative state.  She understands she may be faced with difficult decisions in the near future, and wants to have peace of mind in this situation. I provided emotional support and reassurance  that this is not unreasonable.   Plan for additional GOC discussions in the coming days.    Objective:  Physical Exam Vitals reviewed.  Constitutional:      General: He is not in acute distress.    Appearance: He is ill-appearing.     Interventions: He is sedated.  Pulmonary:     Comments: intubated Musculoskeletal:     Right Lower Extremity: Right leg is amputated above knee.             Palliative Medicine Assessment & Plan   Assessment: Principal Problem:   Respiratory failure (HCC) Active Problems:   Encephalopathy  Fever    Recommendations/Plan: Continue full scope interventions Allow additional time for outcomes PMT will continue to follow  I will follow-up when I return to service on Thursday 1/16.  Please call the team phone [phone] if there are urgent needs in the meantime.  Daughter also has palliative contact info and knows to reach out if needed.  Code Status: Full code   Prognosis:  Unable to determine  Discharge Planning: To Be Determined   Thank you for allowing the Palliative Medicine Team to assist in the care of this patient.   Time: 40 minutes   Recardo KATHEE Loll, NP   Please contact Palliative Medicine Team phone at [phone] for questions and concerns.  For individual providers, please see AMION.

## 2023-06-30 NOTE — Plan of Care (Signed)
  Problem: Safety: Goal: Non-violent Restraint(s) Outcome: Progressing   Problem: Fluid Volume: Goal: Ability to maintain a balanced intake and output will improve Outcome: Progressing   Problem: Metabolic: Goal: Ability to maintain appropriate glucose levels will improve Outcome: Progressing   Problem: Nutritional: Goal: Maintenance of adequate nutrition will improve Outcome: Progressing   Problem: Skin Integrity: Goal: Risk for impaired skin integrity will decrease Outcome: Progressing   Problem: Tissue Perfusion: Goal: Adequacy of tissue perfusion will improve Outcome: Progressing   Problem: Clinical Measurements: Goal: Diagnostic test results will improve Outcome: Progressing Goal: Respiratory complications will improve Outcome: Progressing

## 2023-06-30 NOTE — Progress Notes (Addendum)
 Pt with increasing sedation requirements throughout shift due to vent dyssynchrony. Multiple cuff leaks; high pressure alarms, high respiratory rate alarms. Despite increase in sedation and several boluses, pt's dyssynchrony not improving. Pt observed to have accessory muscle use and abdominal breathing. Noted to have increased secretion burden. Also tremoring/shivering more than prior assessments. New onset hypertension w/ SBP 170-190. Notified [facility], await response.  CXR, KUB obtained. Tube feeds held @ 0400. Despite reaching max dose of versed , pt still with vent dyssynchrony and accessory muscle use. [facility] MD ordered vecuronium  push x 1 and versed  bolus 4 mg total x 1, administered at 0545.

## 2023-06-30 NOTE — Plan of Care (Signed)
 Problem: Safety: Goal: Non-violent Restraint(s) 06/30/2023 1913 by Valorie Inocente NOVAK, RN Outcome: Progressing 06/30/2023 1912 by Valorie Inocente NOVAK, RN Outcome: Progressing   Problem: Education: Goal: Ability to describe self-care measures that may prevent or decrease complications (Diabetes Survival Skills Education) will improve 06/30/2023 1913 by Valorie Inocente NOVAK, RN Outcome: Progressing 06/30/2023 1912 by Valorie Inocente NOVAK, RN Outcome: Progressing Goal: Individualized Educational Video(s) 06/30/2023 1913 by Valorie Inocente NOVAK, RN Outcome: Progressing 06/30/2023 1912 by Valorie Inocente NOVAK, RN Outcome: Progressing   Problem: Coping: Goal: Ability to adjust to condition or change in health will improve 06/30/2023 1913 by Valorie Inocente NOVAK, RN Outcome: Progressing 06/30/2023 1912 by Valorie Inocente NOVAK, RN Outcome: Progressing   Problem: Fluid Volume: Goal: Ability to maintain a balanced intake and output will improve 06/30/2023 1913 by Valorie Inocente NOVAK, RN Outcome: Progressing 06/30/2023 1912 by Valorie Inocente NOVAK, RN Outcome: Progressing   Problem: Health Behavior/Discharge Planning: Goal: Ability to identify and utilize available resources and services will improve 06/30/2023 1913 by Valorie Inocente NOVAK, RN Outcome: Progressing 06/30/2023 1912 by Valorie Inocente NOVAK, RN Outcome: Progressing Goal: Ability to manage health-related needs will improve 06/30/2023 1913 by Valorie Inocente NOVAK, RN Outcome: Progressing 06/30/2023 1912 by Valorie Inocente NOVAK, RN Outcome: Progressing   Problem: Metabolic: Goal: Ability to maintain appropriate glucose levels will improve 06/30/2023 1913 by Valorie Inocente NOVAK, RN Outcome: Progressing 06/30/2023 1912 by Valorie Inocente NOVAK, RN Outcome: Progressing   Problem: Nutritional: Goal: Maintenance of adequate nutrition will improve 06/30/2023 1913 by Valorie Inocente NOVAK, RN Outcome: Progressing 06/30/2023 1912 by Valorie Inocente NOVAK, RN Outcome: Progressing Goal: Progress toward  achieving an optimal weight will improve 06/30/2023 1913 by Valorie Inocente NOVAK, RN Outcome: Progressing 06/30/2023 1912 by Valorie Inocente NOVAK, RN Outcome: Progressing   Problem: Skin Integrity: Goal: Risk for impaired skin integrity will decrease 06/30/2023 1913 by Valorie Inocente NOVAK, RN Outcome: Progressing 06/30/2023 1912 by Valorie Inocente NOVAK, RN Outcome: Progressing   Problem: Tissue Perfusion: Goal: Adequacy of tissue perfusion will improve 06/30/2023 1913 by Valorie Inocente NOVAK, RN Outcome: Progressing 06/30/2023 1912 by Valorie Inocente NOVAK, RN Outcome: Progressing   Problem: Education: Goal: Knowledge of General Education information will improve Description: Including pain rating scale, medication(s)/side effects and non-pharmacologic comfort measures 06/30/2023 1913 by Valorie Inocente NOVAK, RN Outcome: Progressing 06/30/2023 1912 by Valorie Inocente NOVAK, RN Outcome: Progressing   Problem: Health Behavior/Discharge Planning: Goal: Ability to manage health-related needs will improve 06/30/2023 1913 by Valorie Inocente NOVAK, RN Outcome: Progressing 06/30/2023 1912 by Valorie Inocente NOVAK, RN Outcome: Progressing   Problem: Clinical Measurements: Goal: Ability to maintain clinical measurements within normal limits will improve 06/30/2023 1913 by Valorie Inocente NOVAK, RN Outcome: Progressing 06/30/2023 1912 by Valorie Inocente NOVAK, RN Outcome: Progressing Goal: Will remain free from infection 06/30/2023 1913 by Valorie Inocente NOVAK, RN Outcome: Progressing 06/30/2023 1912 by Valorie Inocente NOVAK, RN Outcome: Progressing Goal: Diagnostic test results will improve 06/30/2023 1913 by Valorie Inocente NOVAK, RN Outcome: Progressing 06/30/2023 1912 by Valorie Inocente NOVAK, RN Outcome: Progressing Goal: Respiratory complications will improve 06/30/2023 1913 by Valorie Inocente NOVAK, RN Outcome: Progressing 06/30/2023 1912 by Valorie Inocente NOVAK, RN Outcome: Progressing Goal: Cardiovascular complication will be avoided 06/30/2023 1913 by  Valorie Inocente NOVAK, RN Outcome: Progressing 06/30/2023 1912 by Valorie Inocente NOVAK, RN Outcome: Progressing   Problem: Activity: Goal: Risk for activity intolerance will decrease 06/30/2023 1913 by Valorie Inocente NOVAK, RN Outcome: Progressing 06/30/2023 1912 by Valorie Inocente NOVAK, RN Outcome: Progressing   Problem: Nutrition:  Goal: Adequate nutrition will be maintained 06/30/2023 1913 by Valorie Inocente NOVAK, RN Outcome: Progressing 06/30/2023 1912 by Valorie Inocente NOVAK, RN Outcome: Progressing   Problem: Coping: Goal: Level of anxiety will decrease 06/30/2023 1913 by Valorie Inocente NOVAK, RN Outcome: Progressing 06/30/2023 1912 by Valorie Inocente NOVAK, RN Outcome: Progressing   Problem: Elimination: Goal: Will not experience complications related to bowel motility 06/30/2023 1913 by Valorie Inocente NOVAK, RN Outcome: Progressing 06/30/2023 1912 by Valorie Inocente NOVAK, RN Outcome: Progressing Goal: Will not experience complications related to urinary retention 06/30/2023 1913 by Valorie Inocente NOVAK, RN Outcome: Progressing 06/30/2023 1912 by Valorie Inocente NOVAK, RN Outcome: Progressing   Problem: Pain Management: Goal: General experience of comfort will improve 06/30/2023 1913 by Valorie Inocente NOVAK, RN Outcome: Progressing 06/30/2023 1912 by Valorie Inocente NOVAK, RN Outcome: Progressing   Problem: Safety: Goal: Ability to remain free from injury will improve 06/30/2023 1913 by Valorie Inocente NOVAK, RN Outcome: Progressing 06/30/2023 1912 by Valorie Inocente NOVAK, RN Outcome: Progressing   Problem: Skin Integrity: Goal: Risk for impaired skin integrity will decrease 06/30/2023 1913 by Valorie Inocente NOVAK, RN Outcome: Progressing 06/30/2023 1912 by Valorie Inocente NOVAK, RN Outcome: Progressing   Problem: Education: Goal: Knowledge of disease or condition will improve 06/30/2023 1913 by Valorie Inocente NOVAK, RN Outcome: Progressing 06/30/2023 1912 by Valorie Inocente NOVAK, RN Outcome: Progressing Goal: Knowledge of secondary prevention will  improve (MUST DOCUMENT ALL) 06/30/2023 1913 by Valorie Inocente NOVAK, RN Outcome: Progressing 06/30/2023 1912 by Valorie Inocente NOVAK, RN Outcome: Progressing Goal: Knowledge of patient specific risk factors will improve Alonso N/A or DELETE if not current risk factor) 06/30/2023 1913 by Valorie Inocente NOVAK, RN Outcome: Progressing 06/30/2023 1912 by Valorie Inocente NOVAK, RN Outcome: Progressing   Problem: Ischemic Stroke/TIA Tissue Perfusion: Goal: Complications of ischemic stroke/TIA will be minimized 06/30/2023 1913 by Valorie Inocente NOVAK, RN Outcome: Progressing 06/30/2023 1912 by Valorie Inocente NOVAK, RN Outcome: Progressing   Problem: Coping: Goal: Will verbalize positive feelings about self 06/30/2023 1913 by Valorie Inocente NOVAK, RN Outcome: Progressing 06/30/2023 1912 by Valorie Inocente NOVAK, RN Outcome: Progressing Goal: Will identify appropriate support needs 06/30/2023 1913 by Valorie Inocente NOVAK, RN Outcome: Progressing 06/30/2023 1912 by Valorie Inocente NOVAK, RN Outcome: Progressing   Problem: Health Behavior/Discharge Planning: Goal: Ability to manage health-related needs will improve 06/30/2023 1913 by Valorie Inocente NOVAK, RN Outcome: Progressing 06/30/2023 1912 by Valorie Inocente NOVAK, RN Outcome: Progressing Goal: Goals will be collaboratively established with patient/family 06/30/2023 1913 by Valorie Inocente NOVAK, RN Outcome: Progressing 06/30/2023 1912 by Valorie Inocente NOVAK, RN Outcome: Progressing   Problem: Self-Care: Goal: Ability to participate in self-care as condition permits will improve 06/30/2023 1913 by Valorie Inocente NOVAK, RN Outcome: Progressing 06/30/2023 1912 by Valorie Inocente NOVAK, RN Outcome: Progressing Goal: Verbalization of feelings and concerns over difficulty with self-care will improve 06/30/2023 1913 by Valorie Inocente NOVAK, RN Outcome: Progressing 06/30/2023 1912 by Valorie Inocente NOVAK, RN Outcome: Progressing Goal: Ability to communicate needs accurately will improve 06/30/2023 1913 by Valorie Inocente NOVAK, RN Outcome: Progressing 06/30/2023 1912 by Valorie Inocente NOVAK, RN Outcome: Progressing   Problem: Nutrition: Goal: Risk of aspiration will decrease 06/30/2023 1913 by Valorie Inocente NOVAK, RN Outcome: Progressing 06/30/2023 1912 by Valorie Inocente NOVAK, RN Outcome: Progressing Goal: Dietary intake will improve 06/30/2023 1913 by Valorie Inocente NOVAK, RN Outcome: Progressing 06/30/2023 1912 by Valorie Inocente NOVAK, RN Outcome: Progressing   Problem: Activity: Goal: Ability to tolerate increased activity will improve Outcome: Progressing   Problem: Respiratory:  Goal: Ability to maintain a clear airway and adequate ventilation will improve Outcome: Progressing   Problem: Role Relationship: Goal: Method of communication will improve Outcome: Progressing

## 2023-06-30 NOTE — Progress Notes (Signed)
 NAME:  Gregory [name], MRN:  [id number], DOB:  02-26-1961, LOS: 3 ADMISSION DATE:  06/27/2023, CONSULTATION DATE:  06/27/2023 REFERRING MD:  EDP, CHIEF COMPLAINT:  AMS   History of Present Illness:  63 year old male with significant past medical history including diabetes, hypertension, hyperlipidemia, CKDIIIb, COPD, chronic osteomyelitis s/p R AKA, L midfoot amputation, atrial fibrillation, HFpEF, mesenteric ischemia s/p colostomy 12/2022 who presented to ED from SNF for altered mental status/obtunded. Reported increased liquid stool from ostomy. He was subsequently intubated in the ED. Labs in ED notable for K 5.8, Cr 2.6, WBC 13, trop 83>705, BNP 63, UA large leuk, wbc21-50 few bacteria, UDS negative. Admitted to PCCM.   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 Stroke team/neuro consulted. Recommend stroke work up, d/c meningitis/encephalitis coverage, palliative consult.  1/10 unsuccessful attempts at lumbar puncture 1/12 significant ventilator dyssynchrony, given 1 dose of Nimbex, on Versed  drip  Interim History / Subjective:  Remains unresponsive -Remains intubated -Requires paralysis down to tolerate ventilator  Objective   Blood pressure (!) 101/49, pulse 82, temperature 98.8 F (37.1 C), temperature source Oral, resp. rate 14, height 6' 0.01 (1.829 m), weight 96.5 kg, SpO2 98%.    Vent Mode: PRVC FiO2 (%):  [40 %] 40 % Set Rate:  [28 bmp] 28 bmp Vt Set:  [600 mL] 600 mL PEEP:  [8 cmH20] 8 cmH20 Plateau Pressure:  [20 cmH20-22 cmH20] 20 cmH20   Intake/Output Summary (Last 24 hours) at 06/30/2023 9191 Last data filed at 06/30/2023 0800 Gross per 24 hour  Intake 3635.44 ml   Output 2400 ml  Net 1235.44 ml   Filed Weights   06/28/23 0500 06/29/23 0421 06/30/23 0105  Weight: 96.4 kg 96.3 kg 96.5 kg    Examination: General: Chronically ill-appearing, does not appear to be in distress HENT: OG tube in place, endotracheal tube in place, moist mucous membranes Lungs: Fair air entry bilaterally cardiovascular: S1-S2 appreciated without murmur Abdomen: Bowel sounds appreciated, colostomy present with stool in bag Extremities: R AKA, L midfoot amputation. L leg appears chronically swollen, multiple scabs  Neuro: Sedated, paralytic  no withdraw to pain BLE. +gag/cough.  GU: Fair output  I reviewed nursing notes, Consultant notes, last 24 h vitals and pain scores, last 48 h intake and output, last 24 h labs and trends, and last 24 h imaging results.  Resolved Hospital Problem list     Assessment & Plan:   Septic shock -Continue aggressive support -Off pressors -Urine with ESBL E. Coli  Appreciate infectious disease consultation and guidance Appreciate neurology follow-up -Antibiotics-meropenem , doxycycline   Encephalopathy, altered mental status -Lumbar puncture was unsuccessful  Acute hypoxemic respiratory failure secondary to multifocal pneumonia Respiratory viral panel positive for rhinovirus -Continue mechanical ventilation -Target TVol 6-8cc/kgIBW -Target Plateau Pressure < 30cm H20 -Target driving pressure less than 15 cm of water  -Target PaO2 55-65: titrate PEEP/FiO2 per protocol -Ventilator associated pneumonia prevention protocol Chest x-ray is improved, improved infiltrate,  Acute kidney injury on chronic kidney disease stage IIIb -Maintain renal perfusion -Avoid nephrotoxic medications -Replete electrolytes  Type 2 diabetes -Continue CBG and SSI monitoring  Hypertension Hyperlipidemia -Antihypertensives on hold  Chronic obstructive pulmonary disease -Continue Brovana , Pulmicort , Yupelri   Continue antibiotics for  pneumonia  History of heart failure with reduced ejection fraction -Diuretics on hold  Best Practice (right click and Reselect all SmartList Selections daily)   Diet/type: tubefeeds DVT prophylaxis: prophylactic heparin   Pressure ulcer(s): POA GI prophylaxis: H2B Lines: Central line, Arterial Line, and yes and it is still needed Foley:  Yes, and it is still needed Code Status:  full code Last date of multidisciplinary goals of care discussion [pending]  Labs   CBC: Recent Labs  Lab 06/27/23 0558 06/27/23 0838 06/28/23 0305 06/28/23 0306 06/28/23 0803 06/29/23 0403 06/30/23 0358  WBC 13.0*  --  27.5*  --   --  10.0 7.3  NEUTROABS 11.0*  --   --   --   --   --   --   HGB 11.7*   < > 10.9* 11.2* 10.5* 8.7* 8.5*  HCT 38.1*   < > 35.1* 33.0* 31.0* 27.4* 26.9*  MCV 95.0  --  92.6  --   --  91.0 91.2  PLT 311  --  280  --   --  147* 135*   < > = values in this interval not displayed.    Basic Metabolic Panel: Recent Labs  Lab 06/27/23 1352 06/28/23 0305 06/28/23 0306 06/28/23 0803 06/28/23 1941 06/29/23 0403 06/30/23 0358  NA 136 133* 134* 135 133* 135 140  K 4.3 5.7* 5.8* 5.1 4.5 4.4 4.5  CL 104 104  --   --  104 105 109  CO2 21* 18*  --   --  18* 18* 21*  GLUCOSE 102* 139*  --   --  158* 124* 117*  BUN 50* 53*  --   --  54* 55* 52*  CREATININE 2.82* 2.62*  --   --  2.56* 2.41* 2.08*  CALCIUM  8.1* 8.1*  --   --  8.3* 8.4* 9.1  MG  --  1.5*  --   --   --  2.3 2.2  PHOS  --  5.3*  --   --   --  4.5 3.9   GFR: Estimated Creatinine Clearance: 43.2 mL/min (A) (by C-G formula based on SCr of 2.08 mg/dL (H)). Recent Labs  Lab 06/27/23 0558 06/27/23 0604 06/27/23 0813 06/27/23 0824 06/28/23 0305 06/29/23 0403 06/30/23 0358  PROCALCITON  --   --  1.65  --   --   --   --   WBC 13.0*  --   --   --  27.5* 10.0 7.3  LATICACIDVEN  --  1.8  --  0.9  --   --   --     Liver Function Tests: Recent Labs  Lab 06/27/23 0558  AST 18  ALT 14  ALKPHOS 107  BILITOT  0.6  PROT 7.0  ALBUMIN  3.1*   No results for input(s): LIPASE, AMYLASE in the last 168 hours. Recent Labs  Lab 06/27/23 0925  AMMONIA 30    ABG    Component Value Date/Time   PHART 7.211 (L) 06/28/2023 0803   PCO2ART 51.2 (H) 06/28/2023 0803   PO2ART 99 06/28/2023 0803   HCO3 20.4 06/28/2023 0803   TCO2 22 06/28/2023 0803   ACIDBASEDEF 7.0 (H) 06/28/2023 0803   O2SAT 96 06/28/2023 0803     Coagulation Profile: Recent Labs  Lab 06/27/23 0558  INR 1.1    Cardiac Enzymes: No results for input(s): CKTOTAL, CKMB, CKMBINDEX, TROPONINI in the last 168 hours.  HbA1C: Hemoglobin A1C  Date/Time Value Ref Range Status  08/14/2016 12:00 AM 5.6  Final  01/18/2016 12:00 AM 5.5  Final   Hgb A1c MFr Bld  Date/Time Value Ref Range Status  06/27/2023 09:25 AM 6.4 (H) 4.8 - 5.6 % Final    Comment:    (NOTE) Pre diabetes:          5.7%-6.4%  Diabetes:              >6.4%  Glycemic control for   <7.0% adults with diabetes   12/03/2022 04:54 PM 6.9 (H) 4.8 - 5.6 % Final    Comment:    (NOTE) Pre diabetes:          5.7%-6.4%  Diabetes:              >6.4%  Glycemic control for   <7.0% adults with diabetes     CBG: Recent Labs  Lab 06/29/23 2331 06/30/23 0326 06/30/23 0329 06/30/23 0401 06/30/23 0709  GLUCAP 171* 67* 66* 121* 109*    Review of Systems:   As above   Past Medical History:  He,  has a past medical history of Acquired absence of left great toe (HCC), Acquired absence of right leg below knee (HCC), Acute osteomyelitis of calcaneum, right (HCC) (10/02/2016), Acute respiratory failure (HCC), Amputation stump infection (HCC) (08/12/2018), Anemia, Basal cell carcinoma, eyelid, Cancer (HCC), Candidiasis (08/12/2018), CHF (congestive heart failure) (HCC), Chronic back pain, Chronic kidney disease, Chronic multifocal osteomyelitis (HCC), Chronic pain syndrome, COPD (chronic obstructive pulmonary disease) (HCC), DDD (degenerative disc disease),  lumbar, Depression, Diabetes mellitus without complication (HCC), Diabetic ulcer of toe of left foot (HCC) (10/02/2016), Difficult intubation, Difficulty in walking, not elsewhere classified, 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), Foot drop, bilateral, Foot drop, right (12/27/2015), GERD (gastroesophageal reflux disease), Herpesviral vesicular dermatitis, History of COVID-19 (03/15/2022), Hyperlipidemia, Hyperlipidemia, Hypertension, Insomnia, Malignant melanoma of other parts of face (HCC), Melanoma (HCC), MRSA bacteremia, Muscle weakness (generalized), Nasal congestion, Necrosis of toe (HCC) (07/08/2018), Neuromuscular dysfunction of bladder, Osteomyelitis (HCC), Osteomyelitis (HCC), Osteomyelitis (HCC), Other idiopathic peripheral autonomic neuropathy, Retention of urine, unspecified, Squamous cell carcinoma of skin, Stroke (HCC), Unsteadiness on feet, Urinary retention, Urinary retention, Urinary tract infection, Wears dentures, and Wears glasses.   Surgical History:   Past Surgical History:  Procedure Laterality Date   AMPUTATION Left 03/29/2017   Procedure: LEFT GREAT TOE AMPUTATION AT METATARSOPHALANGEAL JOINT;  Surgeon: Harden Jerona GAILS, MD;  Location[facility] OR;  Service: Orthopedics;  Laterality: Left;   AMPUTATION Right 03/29/2017   Procedure: RIGHT BELOW KNEE AMPUTATION;  Surgeon: Harden Jerona GAILS, MD;  Location: [facility] OR;  Service: Orthopedics;  Laterality: Right;   AMPUTATION Left 06/06/2018   Procedure: LEFT 2ND TOE AMPUTATION;  Surgeon: Harden Jerona GAILS, MD;  Location: [facility] OR;  Service: Orthopedics;  Laterality: Left;   AMPUTATION Right 11/19/2018   Procedure: AMPUTATION ABOVE KNEE;  Surgeon: Harden Jerona GAILS, MD;  Location: [facility] OR;  Service: Orthopedics;  Laterality: Right;   ANTERIOR CERVICAL CORPECTOMY N/A 11/25/2015   Procedure: ANTERIOR CERVICAL FIVE CORPECTOMY Cervical four - six fusion;  Surgeon: Gerldine Maizes, MD;  Location: [facility];  Service: Neurosurgery;  Laterality: N/A;  ANTERIOR CERVICAL  FIVE CORPECTOMY Cervical four - six fusion   APPLICATION OF WOUND VAC Right 10/22/2018   Procedure: Application Of Wound Vac;  Surgeon: Harden Jerona GAILS, MD;  Location: [facility] OR;  Service: Orthopedics;  Laterality: Right;   CYSTOSCOPY WITH BIOPSY N/A 05/01/2022   Procedure: CYSTOSCOPY WITH URETHRAL BIOPSY;  Surgeon: Elisabeth Valli BIRCH, MD;  Location: [facility] ORS;  Service: Urology;  Laterality: N/A;  1  HR   CYSTOSCOPY WITH RETROGRADE URETHROGRAM N/A 04/25/2018   Procedure: CYSTOSCOPY WITH RETROGRADE URETHROGRAM/ BALLOON DILATION;  Surgeon: Ottelin, Mark, MD;  Location: [facility] ORS;  Service: Urology;  Laterality: N/A;   CYSTOSCOPY WITH URETHRAL DILATATION N/A 05/01/2022   Procedure: BALLOON DILATION WITH  OPTILUME;  Surgeon: Elisabeth Valli BIRCH, MD;  Location: [facility] ORS;  Service: Urology;  Laterality: N/A;   LAPAROTOMY N/A 12/03/2022   Procedure: EXPLORATORY LAPAROTOMY  total abdominal colectomy with end ileostomy;  Surgeon: Kinsinger, Herlene Righter, MD;  Location: [facility] OR;  Service: General;  Laterality: N/A;   MULTIPLE TOOTH EXTRACTIONS     POSTERIOR CERVICAL FUSION/FORAMINOTOMY N/A 11/29/2015   Procedure: Cervical Three-Cervical Seven Posterior Cervical Laminectomy with Fusion;  Surgeon: Gerldine Maizes, MD;  Location: [facility];  Service: Neurosurgery;  Laterality: N/A;  Cervical Three-Cervical Seven Posterior Cervical Laminectomy with Fusion   STUMP REVISION Right 10/22/2018   Procedure: REVISION RIGHT BELOW KNEE AMPUTATION;  Surgeon: Harden Jerona GAILS, MD;  Location: [facility] OR;  Service: Orthopedics;  Laterality: Right;   STUMP REVISION Right 12/05/2018   Procedure: Revision Right Above Knee Amputation;  Surgeon: Harden Jerona GAILS, MD;  Location: [facility] OR;  Service: Orthopedics;  Laterality: Right;   STUMP REVISION Right 05/29/2019   Procedure: REVISION RIGHT ABOVE KNEE AMPUTATION;  Surgeon: Harden Jerona GAILS, MD;  Location: [facility] OR;  Service: Orthopedics;  Laterality: Right;   STUMP REVISION Right 06/24/2019   Procedure: STUMP  REVISION;  Surgeon: Harden Jerona GAILS, MD;  Location: [facility] OR;  Service: Orthopedics;  Laterality: Right;     Social History:   reports that he has been smoking cigarettes. He has never used smokeless tobacco. He reports that he does not drink alcohol  and does not use drugs.   Family History:  His family history includes Bone cancer in his father; Cancer in his father, mother, paternal grandfather, and another family member; Diabetes in his mother and another family member; Heart disease in his mother; Prostate cancer in his father.   Allergies Allergies  Allergen Reactions   Latex Other (See Comments)    ALLERGIC, per [facility]    The patient is critically ill with multiple organ systems failure and requires high complexity decision making for assessment and support, frequent evaluation and titration of therapies, application of advanced monitoring technologies and extensive interpretation of multiple databases. Critical Care Time devoted to patient care services described in this note independent of APP/resident time (if applicable)  is 35 minutes.   Jennet Epley MD [facility] Personal pager: See Amion If unanswered, please page CCM On-call: [phone]

## 2023-06-30 NOTE — Progress Notes (Signed)
 SLP Cancellation Note  Patient Details Name: Gregory [name] MRN: [id number] DOB: 12-11-1960   Cancelled treatment:       Reason Eval/Treat Not Completed: Patient not medically ready   Elba Dendinger, Riley Nearing 06/30/2023, 7:45 AM

## 2023-06-30 NOTE — Progress Notes (Signed)
 On assessment, pt's a-line waveform extremely dampened. Cuff pressures and a-line pressures not correlating. Attempted to redress, flush, and adjust wrist positioning. Unable to recover functioning of a-line. Removed at 2130 by this RN, pressure held. No bleeding, no complications. Cuff pressure now cycling every 15 minutes.

## 2023-07-01 ENCOUNTER — Inpatient Hospital Stay (HOSPITAL_COMMUNITY): Payer: Medicaid Other

## 2023-07-01 DIAGNOSIS — R4182 Altered mental status, unspecified: Secondary | ICD-10-CM

## 2023-07-01 DIAGNOSIS — J189 Pneumonia, unspecified organism: Secondary | ICD-10-CM

## 2023-07-01 DIAGNOSIS — I639 Cerebral infarction, unspecified: Secondary | ICD-10-CM | POA: Diagnosis not present

## 2023-07-01 DIAGNOSIS — I6621 Occlusion and stenosis of right posterior cerebral artery: Secondary | ICD-10-CM

## 2023-07-01 DIAGNOSIS — J9601 Acute respiratory failure with hypoxia: Secondary | ICD-10-CM | POA: Diagnosis not present

## 2023-07-01 LAB — MAGNESIUM: Magnesium: 2 mg/dL (ref 1.7–2.4)

## 2023-07-01 LAB — CBC
HCT: 21.3 % — ABNORMAL LOW (ref 39.0–52.0)
Hemoglobin: 6.9 g/dL — CL (ref 13.0–17.0)
MCH: 29.9 pg (ref 26.0–34.0)
MCHC: 32.4 g/dL (ref 30.0–36.0)
MCV: 92.2 fL (ref 80.0–100.0)
Platelets: 100 10*3/uL — ABNORMAL LOW (ref 150–400)
RBC: 2.31 MIL/uL — ABNORMAL LOW (ref 4.22–5.81)
RDW: 15.6 % — ABNORMAL HIGH (ref 11.5–15.5)
WBC: 4.2 10*3/uL (ref 4.0–10.5)
nRBC: 0 % (ref 0.0–0.2)

## 2023-07-01 LAB — LEGIONELLA PNEUMOPHILA SEROGP 1 UR AG: L. pneumophila Serogp 1 Ur Ag: NEGATIVE

## 2023-07-01 LAB — GLUCOSE, CAPILLARY
Glucose-Capillary: 150 mg/dL — ABNORMAL HIGH (ref 70–99)
Glucose-Capillary: 153 mg/dL — ABNORMAL HIGH (ref 70–99)
Glucose-Capillary: 154 mg/dL — ABNORMAL HIGH (ref 70–99)
Glucose-Capillary: 156 mg/dL — ABNORMAL HIGH (ref 70–99)
Glucose-Capillary: 157 mg/dL — ABNORMAL HIGH (ref 70–99)
Glucose-Capillary: 161 mg/dL — ABNORMAL HIGH (ref 70–99)

## 2023-07-01 LAB — BASIC METABOLIC PANEL
Anion gap: 6 (ref 5–15)
BUN: 47 mg/dL — ABNORMAL HIGH (ref 8–23)
CO2: 22 mmol/L (ref 22–32)
Calcium: 8.6 mg/dL — ABNORMAL LOW (ref 8.9–10.3)
Chloride: 117 mmol/L — ABNORMAL HIGH (ref 98–111)
Creatinine, Ser: 1.61 mg/dL — ABNORMAL HIGH (ref 0.61–1.24)
GFR, Estimated: 47 mL/min — ABNORMAL LOW (ref >60)
Glucose, Bld: 180 mg/dL — ABNORMAL HIGH (ref 70–99)
Potassium: 4.1 mmol/L (ref 3.5–5.1)
Sodium: 145 mmol/L (ref 135–145)

## 2023-07-01 LAB — CULTURE, RESPIRATORY W GRAM STAIN

## 2023-07-01 LAB — PREPARE RBC (CROSSMATCH)

## 2023-07-01 LAB — LDL CHOLESTEROL, DIRECT: Direct LDL: 29 mg/dL (ref 0–99)

## 2023-07-01 LAB — PHOSPHORUS: Phosphorus: 2.9 mg/dL (ref 2.5–4.6)

## 2023-07-01 MED ORDER — VITAL 1.5 CAL PO LIQD
1000.0000 mL | ORAL | Status: DC
  Administered 2023-07-02 – 2023-07-06 (×6): 1000 mL

## 2023-07-01 MED ORDER — HYDRALAZINE HCL 20 MG/ML IJ SOLN
10.0000 mg | INTRAMUSCULAR | Status: DC | PRN
Start: 2023-07-01 — End: 2023-07-11
  Administered 2023-07-02 – 2023-07-06 (×4): 10 mg via INTRAVENOUS
  Filled 2023-07-01 (×4): qty 1

## 2023-07-01 MED ORDER — OXYCODONE HCL 5 MG PO TABS
5.0000 mg | ORAL_TABLET | Freq: Four times a day (QID) | ORAL | Status: DC
  Administered 2023-07-01 – 2023-07-02 (×4): 5 mg
  Filled 2023-07-01 (×4): qty 1

## 2023-07-01 MED ORDER — NUTRISOURCE FIBER PO PACK
1.0000 | PACK | Freq: Two times a day (BID) | ORAL | Status: DC
  Administered 2023-07-01 – 2023-07-08 (×15): 1
  Filled 2023-07-01 (×16): qty 1

## 2023-07-01 MED ORDER — CARVEDILOL 12.5 MG PO TABS
12.5000 mg | ORAL_TABLET | Freq: Two times a day (BID) | ORAL | Status: DC
  Administered 2023-07-02 – 2023-07-08 (×12): 12.5 mg
  Filled 2023-07-01 (×13): qty 1

## 2023-07-01 MED ORDER — SODIUM CHLORIDE 0.9 % IV SOLN
2.0000 g | Freq: Two times a day (BID) | INTRAVENOUS | Status: AC
  Administered 2023-07-01 – 2023-07-05 (×9): 2 g via INTRAVENOUS
  Filled 2023-07-01 (×9): qty 12.5

## 2023-07-01 MED ORDER — FAMOTIDINE 20 MG PO TABS
20.0000 mg | ORAL_TABLET | Freq: Two times a day (BID) | ORAL | Status: DC
  Administered 2023-07-01 – 2023-07-03 (×6): 20 mg
  Filled 2023-07-01 (×6): qty 1

## 2023-07-01 MED ORDER — JUVEN PO PACK
1.0000 | PACK | Freq: Two times a day (BID) | ORAL | Status: DC
Start: 2023-07-01 — End: 2023-07-08
  Administered 2023-07-01 – 2023-07-08 (×15): 1
  Filled 2023-07-01 (×15): qty 1

## 2023-07-01 MED ORDER — CARVEDILOL 12.5 MG PO TABS: 12.5000 mg | ORAL_TABLET | Freq: Two times a day (BID) | ORAL | Status: DC

## 2023-07-01 MED ORDER — FENTANYL BOLUS VIA INFUSION
30.0000 ug | INTRAVENOUS | Status: DC | PRN
  Administered 2023-07-01 – 2023-07-02 (×10): 30 ug via INTRAVENOUS

## 2023-07-01 MED ORDER — SODIUM CHLORIDE 0.9% IV SOLUTION: Freq: Once | INTRAVENOUS | Status: AC

## 2023-07-01 NOTE — TOC CM/SW Note (Signed)
 Transition of Care [facility]) - Inpatient Brief Assessment   Patient Details  Name: RALF KONOPKA MRN: [id number] Date of Birth: [birth year]  Transition of [facility]) CM/SW Contact:    Lauraine FORBES Saa, LCSW Phone Number: 07/01/2023, 3:17 PM   Clinical Narrative:  3:17 PM Per progressions, patient is estimated to discharge in ten days and return to [facility]. TOC will continue to follow.  Transition of Care Asessment: Insurance and Status: Insurance coverage has been reviewed Patient has primary care physician: No Home environment has been reviewed: SNF   Prior/Current Home Services: No current home services Social Drivers of Health Review:  (Needs updated SDOH) Readmission risk has been reviewed: Yes Transition of care needs: transition of care needs identified, TOC will continue to follow

## 2023-07-01 NOTE — Progress Notes (Signed)
 VASCULAR LAB    Extremely limited carotid duplex has been performed.  See CV proc for preliminary results.   Kenta Laster, RVT 07/01/2023, 12:57 PM

## 2023-07-01 NOTE — Progress Notes (Addendum)
 eLink Physician-Brief Progress Note Patient Name: BENIGNO CHECK DOB: [birth year] MRN: [id number]   Date of Service  07/01/2023  HPI/Events of Note  63 y.o. male with past medical history of COPD, A-fib, CKD stage IIIb, mesenteric ischemia s/p ileostomy (12/2022), type 2 diabetes, HFpEF, HTN, and HLD who presented to the ED from SNF on 06/27/2023 with altered mental status.   Periods of agitation concerning for pain.  On a fentanyl  infusion.  eICU Interventions  Add fentanyl  bolus from bag as needed.   9675 -still agitated despite 4 mg continuous for side, the medics, Versed  2 mg push and fentanyl  bolus from bag.  RASS +2.  One-time Versed  bolus 4 mg.  Add on as needed Haldol .  QTc 433.  0401 -minimal improvement despite fentanyl  200 mcg, Versed  of 4, multiple pushes of Versed  and fentanyl .  No response to Haldol .  Patient is typically writhing around in bed and tachycardic, tachypneic, and hypertensive.  IV lines are working as expected.  Appropriate blood draw.  Will push 2 mg of Dilaudid  IV x 1.  Labetalol  as needed in the setting of A-fib RVR.  Hold amiodarone /diltiazem until agitation resolves.  Intervention Category Minor Interventions: Agitation / anxiety - evaluation and management  Kaidence Sant 07/01/2023, 8:56 PM

## 2023-07-01 NOTE — Progress Notes (Signed)
 VASCULAR LAB    TCD has been performed.  See CV proc for preliminary results.   Odalis Jordan, RVT 07/01/2023, 12:57 PM

## 2023-07-01 NOTE — Progress Notes (Signed)
 NAME:  Gregory [name], MRN:  [id number], DOB:  April 21, 1961, LOS: 4 ADMISSION DATE:  06/27/2023, CONSULTATION DATE:  06/27/23 REFERRING MD:  EDP, CHIEF COMPLAINT:  AMS   History of Present Illness:  63 year old male with significant past medical history including diabetes, hypertension, hyperlipidemia, CKDIIIb, COPD, chronic osteomyelitis s/p R AKA, L midfoot amputation, atrial fibrillation, HFpEF, mesenteric ischemia s/p colostomy 12/2022 who presented to ED from SNF for altered mental status/obtunded. Reported increased liquid stool from ostomy. He was subsequently intubated in the ED. Labs in ED notable for K 5.8, Cr 2.6, WBC 13, trop 83>705, BNP 63, UA large leuk, wbc21-50 few bacteria, UDS negative. Admitted to PCCM.   Pertinent  Medical History  COPD, CKD3b, mesenteric ischemia s/p colostomy 12/2022, T2DM, HFpEF, HTN, HLD   Significant Hospital Events: Including procedures, antibiotic start and stop dates in addition to other pertinent events   1/9: admit from ED for AMS, intubated, CVC 1/10: MRI w/ 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 Stroke team/neuro consulted. Recommend stroke work up, d/c meningitis/encephalitis coverage, palliative consult.  1/10 unsuccessful attempts at lumbar puncture 1/12 significant ventilator dyssynchrony, given 1 dose of Nimbex, on Versed  drip  Interim History / Subjective:  Opens eyes, but not following commands. Minimally interactive.   Objective   Blood pressure (!) 115/55, pulse 81, temperature 99.9 F (37.7 C), resp. rate (!) 27, height 6' 0.01 (1.829 m), weight 91.2 kg, SpO2 97%.    Vent Mode: PRVC FiO2 (%):  [40 %] 40 % Set Rate:  [28 bmp] 28 bmp Vt Set:  [600 mL] 600 mL PEEP:  [5 cmH20-8 cmH20] 5 cmH20 Plateau Pressure:  [19 cmH20-22 cmH20] 22 cmH20   Intake/Output Summary (Last 24 hours) at 07/01/2023 9360 Last data filed at 07/01/2023 0600 Gross per 24 hour  Intake 3704.05 ml  Output 2875 ml  Net 829.05 ml   Filed  Weights   06/29/23 0421 06/30/23 0105 07/01/23 0450  Weight: 96.3 kg 96.5 kg 91.2 kg    Examination: General: chronically ill appearing, sedated on vent HENT: OG tube, ET tube in place Lungs: coarse breath sounds bilaterally Cardiovascular: regular rate, rhythm Abdomen: colostomy present w stool output, soft abdomen Extremities: R AKA, L midfoot amputation; left leg with edema and scabs Neuro: will open eyes, but not following commands  GU: foley in place  Resolved Hospital Problem list     Assessment & Plan:  Septic shock 2/2 ESBL UTI and multifocal pneumonia Rhinovirus positive - off pressors - continue meropenem , doxycycline ; appreciate ID consultation - trach aspirate growing klebsiella and serratia   Encephalopathy - LP unsuccessful - Weaning sedation to see if mental status improves   Acute hypoxemic respiratory failure 2/2 multifocal pneumonia COPD Vent settings: PRVC, RR 28, TV 600, PEEP 5, platea pressure 22, FIO2 40%  - had desynchrony with vent overnight - doxy, meropenem  as above - wean versed , fentanyl  for sedation  - SBT if mentation improves  - VAP protocol  - Brovana , Pulmicort , Yupelri   AKI on CKD IIIb - Cr improved from 2.08 to 1.61  - maintain renal perfusion - avoid nephrotoxic meds - replete electrolytes prn  Normocytic anemia - Hb 6.9, MCV 92; no active bleeding identified  - 1u prbc   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  - carotid ultrasound, transcranial dopplers per neuro  Diarrhea  - add fiber supplementation   HFrEF - diuretics held  HTN HLD - antihypertensives held  T2DM - CBGs + SSI  Thrombcoytopenia - low risk for HIT - Plt 100, monitor for now  Best Practice (right click and Reselect all SmartList Selections daily)   Diet/type: tubefeeds DVT prophylaxis prophylactic heparin   Pressure ulcer(s): present on admission  GI prophylaxis: H2B Lines: Central line and yes and it is still needed Foley:  Yes,  and it is still needed Code Status:  full code Last date of multidisciplinary goals of care discussion [pending, no family at bedside]  Labs   CBC: Recent Labs  Lab 06/27/23 0558 06/27/23 9161 06/28/23 0305 06/28/23 0306 06/28/23 0803 06/29/23 0403 06/30/23 0358 07/01/23 0542  WBC 13.0*  --  27.5*  --   --  10.0 7.3 4.2  NEUTROABS 11.0*  --   --   --   --   --   --   --   HGB 11.7*   < > 10.9* 11.2* 10.5* 8.7* 8.5* 6.9*  HCT 38.1*   < > 35.1* 33.0* 31.0* 27.4* 26.9* 21.3*  MCV 95.0  --  92.6  --   --  91.0 91.2 92.2  PLT 311  --  280  --   --  147* 135* 100*   < > = values in this interval not displayed.    Basic Metabolic Panel: Recent Labs  Lab 06/28/23 0305 06/28/23 0306 06/28/23 0803 06/28/23 1941 06/29/23 0403 06/30/23 0358 07/01/23 0542  NA 133*   < > 135 133* 135 140 145  K 5.7*   < > 5.1 4.5 4.4 4.5 4.1  CL 104  --   --  104 105 109 117*  CO2 18*  --   --  18* 18* 21* 22  GLUCOSE 139*  --   --  158* 124* 117* 180*  BUN 53*  --   --  54* 55* 52* 47*  CREATININE 2.62*  --   --  2.56* 2.41* 2.08* 1.61*  CALCIUM  8.1*  --   --  8.3* 8.4* 9.1 8.6*  MG 1.5*  --   --   --  2.3 2.2 2.0  PHOS 5.3*  --   --   --  4.5 3.9 2.9   < > = values in this interval not displayed.   GFR: Estimated Creatinine Clearance: 50.9 mL/min (A) (by C-G formula based on SCr of 1.61 mg/dL (H)). Recent Labs  Lab 06/27/23 0604 06/27/23 0813 06/27/23 0824 06/28/23 0305 06/29/23 0403 06/30/23 0358 07/01/23 0542  PROCALCITON  --  1.65  --   --   --   --   --   WBC  --   --   --  27.5* 10.0 7.3 4.2  LATICACIDVEN 1.8  --  0.9  --   --   --   --     Liver Function Tests: Recent Labs  Lab 06/27/23 0558  AST 18  ALT 14  ALKPHOS 107  BILITOT 0.6  PROT 7.0  ALBUMIN  3.1*   No results for input(s): LIPASE, AMYLASE in the last 168 hours. Recent Labs  Lab 06/27/23 0925  AMMONIA 30    ABG    Component Value Date/Time   PHART 7.211 (L) 06/28/2023 0803   PCO2ART 51.2 (H)  06/28/2023 0803   PO2ART 99 06/28/2023 0803   HCO3 20.4 06/28/2023 0803   TCO2 22 06/28/2023 0803   ACIDBASEDEF 7.0 (H) 06/28/2023 0803   O2SAT 96 06/28/2023 0803     Coagulation Profile: Recent Labs  Lab 06/27/23 0558  INR 1.1    Cardiac Enzymes: No  results for input(s): CKTOTAL, CKMB, CKMBINDEX, TROPONINI in the last 168 hours.  HbA1C: Hemoglobin A1C  Date/Time Value Ref Range Status  08/14/2016 12:00 AM 5.6  Final  01/18/2016 12:00 AM 5.5  Final   Hgb A1c MFr Bld  Date/Time Value Ref Range Status  06/27/2023 09:25 AM 6.4 (H) 4.8 - 5.6 % Final    Comment:    (NOTE) Pre diabetes:          5.7%-6.4%  Diabetes:              >6.4%  Glycemic control for   <7.0% adults with diabetes   12/03/2022 04:54 PM 6.9 (H) 4.8 - 5.6 % Final    Comment:    (NOTE) Pre diabetes:          5.7%-6.4%  Diabetes:              >6.4%  Glycemic control for   <7.0% adults with diabetes     CBG: Recent Labs  Lab 06/30/23 1117 06/30/23 1502 06/30/23 1905 06/30/23 2310 07/01/23 0313  GLUCAP 111* 113* 129* 122* 150*    Review of Systems:   Unable to obtain  Past Medical History:  He,  has a past medical history of Acquired absence of left great toe (HCC), Acquired absence of right leg below knee (HCC), Acute osteomyelitis of calcaneum, right (HCC) (10/02/2016), Acute respiratory failure (HCC), Amputation stump infection (HCC) (08/12/2018), Anemia, Basal cell carcinoma, eyelid, Cancer (HCC), Candidiasis (08/12/2018), CHF (congestive heart failure) (HCC), Chronic back pain, Chronic kidney disease, Chronic multifocal osteomyelitis (HCC), Chronic pain syndrome, COPD (chronic obstructive pulmonary disease) (HCC), DDD (degenerative disc disease), lumbar, Depression, Diabetes mellitus without complication (HCC), Diabetic ulcer of toe of left foot (HCC) (10/02/2016), Difficult intubation, Difficulty in walking, not elsewhere classified, 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), Foot drop,  bilateral, Foot drop, right (12/27/2015), GERD (gastroesophageal reflux disease), Herpesviral vesicular dermatitis, History of COVID-19 (03/15/2022), Hyperlipidemia, Hyperlipidemia, Hypertension, Insomnia, Malignant melanoma of other parts of face (HCC), Melanoma (HCC), MRSA bacteremia, Muscle weakness (generalized), Nasal congestion, Necrosis of toe (HCC) (07/08/2018), Neuromuscular dysfunction of bladder, Osteomyelitis (HCC), Osteomyelitis (HCC), Osteomyelitis (HCC), Other idiopathic peripheral autonomic neuropathy, Retention of urine, unspecified, Squamous cell carcinoma of skin, Stroke (HCC), Unsteadiness on feet, Urinary retention, Urinary retention, Urinary tract infection, Wears dentures, and Wears glasses.   Surgical History:   Past Surgical History:  Procedure Laterality Date   AMPUTATION Left 03/29/2017   Procedure: LEFT GREAT TOE AMPUTATION AT METATARSOPHALANGEAL JOINT;  Surgeon: Harden Jerona GAILS, MD;  Location: [facility] OR;  Service: Orthopedics;  Laterality: Left;   AMPUTATION Right 03/29/2017   Procedure: RIGHT BELOW KNEE AMPUTATION;  Surgeon: Harden Jerona GAILS, MD;  Location: [facility] OR;  Service: Orthopedics;  Laterality: Right;   AMPUTATION Left 06/06/2018   Procedure: LEFT 2ND TOE AMPUTATION;  Surgeon: Harden Jerona GAILS, MD;  Location[facility] OR;  Service: Orthopedics;  Laterality: Left;   AMPUTATION Right 11/19/2018   Procedure: AMPUTATION ABOVE KNEE;  Surgeon: Harden Jerona GAILS, MD;  Location: [facility] OR;  Service: Orthopedics;  Laterality: Right;   ANTERIOR CERVICAL CORPECTOMY N/A 11/25/2015   Procedure: ANTERIOR CERVICAL FIVE CORPECTOMY Cervical four - six fusion;  Surgeon: Gerldine Maizes, MD;  Location: [facility] OR[facility];  Service: Neurosurgery;  Laterality: N/A;  ANTERIOR CERVICAL FIVE CORPECTOMY Cervical four - six fusion   APPLICATION OF WOUND VAC Right 10/22/2018   Procedure: Application Of Wound Vac;  Surgeon: Harden Jerona GAILS, MD;  Location: [facility] OR;  Service: Orthopedics;  Laterality: Right;   CYSTOSCOPY  WITH  BIOPSY N/A 05/01/2022   Procedure: CYSTOSCOPY WITH URETHRAL BIOPSY;  Surgeon: Elisabeth Valli BIRCH, MD;  Location: [facility] ORS;  Service: Urology;  Laterality: N/A;  1 HR   CYSTOSCOPY WITH RETROGRADE URETHROGRAM N/A 04/25/2018   Procedure: CYSTOSCOPY WITH RETROGRADE URETHROGRAM/ BALLOON DILATION;  Surgeon: Ottelin, Mark, MD;  Location: [facility] ORS;  Service: Urology;  Laterality: N/A;   CYSTOSCOPY WITH URETHRAL DILATATION N/A 05/01/2022   Procedure: BALLOON DILATION WITH  OPTILUME;  Surgeon: Elisabeth Valli BIRCH, MD;  Location: [facility] ORS;  Service: Urology;  Laterality: N/A;   LAPAROTOMY N/A 12/03/2022   Procedure: EXPLORATORY LAPAROTOMY  total abdominal colectomy with end ileostomy;  Surgeon: Kinsinger, Herlene Righter, MD;  Location: [facility] OR;  Service: General;  Laterality: N/A;   MULTIPLE TOOTH EXTRACTIONS     POSTERIOR CERVICAL FUSION/FORAMINOTOMY N/A 11/29/2015   Procedure: Cervical Three-Cervical Seven Posterior Cervical Laminectomy with Fusion;  Surgeon: Gerldine Maizes, MD;  Location: [facility];  Service: Neurosurgery;  Laterality: N/A;  Cervical Three-Cervical Seven Posterior Cervical Laminectomy with Fusion   STUMP REVISION Right 10/22/2018   Procedure: REVISION RIGHT BELOW KNEE AMPUTATION;  Surgeon: Harden Jerona GAILS, MD;  Location: [facility] OR;  Service: Orthopedics;  Laterality: Right;   STUMP REVISION Right 12/05/2018   Procedure: Revision Right Above Knee Amputation;  Surgeon: Harden Jerona GAILS, MD;  Location: [facility] OR;  Service: Orthopedics;  Laterality: Right;   STUMP REVISION Right 05/29/2019   Procedure: REVISION RIGHT ABOVE KNEE AMPUTATION;  Surgeon: Harden Jerona GAILS, MD;  Location: [facility] OR;  Service: Orthopedics;  Laterality: Right;   STUMP REVISION Right 06/24/2019   Procedure: STUMP REVISION;  Surgeon: Harden Jerona GAILS, MD;  Location: [facility] OR;  Service: Orthopedics;  Laterality: Right;     Social History:   reports that he has been smoking cigarettes. He has never used smokeless tobacco. He reports that he does not drink  alcohol  and does not use drugs.   Family History:  His family history includes Bone cancer in his father; Cancer in his father, mother, paternal grandfather, and another family member; Diabetes in his mother and another family member; Heart disease in his mother; Prostate cancer in his father.   Allergies Allergies  Allergen Reactions   Latex Other (See Comments)    ALLERGIC, per [facility]     Home Medications  Prior to Admission medications   Medication Sig Start Date End Date Taking? Authorizing Provider  acetaminophen  (TYLENOL ) 500 MG tablet Take 1,000 mg by mouth in the morning and at bedtime.   Yes [provider]  aspirin  EC 81 MG tablet Take 81 mg by mouth daily. Swallow whole.   Yes [provider]  atorvastatin  (LIPITOR ) 80 MG tablet Take 1 tablet (80 mg total) by mouth daily. Patient taking differently: Take 80 mg by mouth at bedtime. 09/22/22 01/27/24 Yes Cindy Garnette POUR, MD  carvedilol  (COREG ) 12.5 MG tablet Take 1 tablet (12.5 mg total) by mouth 2 (two) times daily with a meal. 12/25/22  Yes Ghimire, Renato, MD  Cholecalciferol  25 MCG (1000 UT) capsule Take 2,000 Units by mouth daily.   Yes [provider]  Cranberry 450 MG CAPS Take 450 mg by mouth daily.   Yes [provider]  cyclobenzaprine  (FLEXERIL ) 5 MG tablet Take 5 mg by mouth every 8 (eight) hours as needed for muscle spasms.   Yes [provider]  Dextran 70-Hypromellose (NATURAL BALANCE TEARS) 0.1-0.3 % SOLN Place 1 drop into both eyes every 4 (four) hours as needed (dry  eyes).   Yes [provider]  escitalopram  (LEXAPRO ) 20 MG tablet Take 20 mg by mouth daily.   Yes [provider]  ferrous sulfate  325 (65 FE) MG tablet Take 1 tablet (325 mg total) by mouth 3 (three) times daily with meals. Patient taking differently: Take 325 mg by mouth daily. With meal. 01/23/23  Yes Bernard Drivers, MD  fluticasone  (FLONASE ) 50 MCG/ACT nasal spray Place 1 spray into both  nostrils daily.   Yes [provider]  furosemide  (LASIX ) 20 MG tablet Take 20-40 mg by mouth See admin instructions. Take 40 mg by mouth in the morning and 20 mg by mouth in the afternoon   Yes [provider]  haloperidol  lactate (HALDOL ) 5 MG/ML injection Inject 1 mg into the muscle every 6 (six) hours as needed (agitation/psychosis).   Yes [provider]  hydrALAZINE  (APRESOLINE ) 10 MG tablet Take 1 tablet (10 mg total) by mouth every 8 (eight) hours. 12/25/22  Yes Raenelle Coria, MD  hydrOXYzine  (VISTARIL ) 50 MG capsule Take 50 mg by mouth at bedtime.   Yes [provider]  insulin  glargine (LANTUS  SOLOSTAR) 100 UNIT/ML Solostar Pen Inject 15 Units into the skin daily. Patient taking differently: Inject 15 Units into the skin at bedtime. 09/07/22  Yes Rai, Ripudeep K, MD  insulin  lispro (HUMALOG ) 100 UNIT/ML KwikPen Inject 0-10 Units into the skin See admin instructions. Inject 0-10 units into the skin three times a day and at bedtime, PER SLIDING SCALE:  BGL 151-200 = 2 units 201-250 = 4 units 251-300 = 6 units 301-350 =  8 units 351-400 = 10 units Patient taking differently: Inject 0-10 Units into the skin See admin instructions. Inject 0-10 units into the skin before meals and at bedtime, PER SLIDING SCALE:  0-150 = 0 units 151-200 = 2 units 201-250 = 4 units 251-300 = 6 units 301-350 =  8 units 351-400 = 10 units 01/31/22  Yes Dahal, Binaya, MD  ipratropium-albuterol  (DUONEB) 0.5-2.5 (3) MG/3ML SOLN Take 3 mLs by nebulization every 6 (six) hours as needed (for shortness of breath).   Yes [provider]  LACTOBACILLUS PO Take 1 capsule by mouth in the morning and at bedtime.   Yes [provider]  lidocaine  (LIDODERM ) 5 % Place 2 patches onto the skin daily. Remove & Discard patch within 12 hours or as directed by MD Patient taking differently: Place 1 patch onto the skin in the morning and at bedtime. Take off after 12 hours 12/25/22   Yes Raenelle Coria, MD  loperamide  (IMODIUM ) 2 MG capsule Take 1 capsule (2 mg total) by mouth 2 (two) times daily. 12/25/22  Yes Raenelle Coria, MD  loratadine  (CLARITIN ) 10 MG tablet Take 10 mg by mouth in the morning.   Yes [provider]  magnesium  oxide (MAG-OX) 400 (240 Mg) MG tablet Take 400 mg by mouth in the morning, at noon, and at bedtime.   Yes [provider]  Melatonin 10 MG TABS Take 10 mg by mouth at bedtime.   Yes [provider]  Meth-Hyo-M Bl-Na Phos-Ph Sal (URO-MP) 118 MG CAPS Take 118 mg by mouth 2 (two) times daily.   Yes [provider]  Multiple Vitamin (MULTIVITAMIN WITH MINERALS) TABS tablet Take 1 tablet by mouth daily. 12/25/22  Yes Raenelle Coria, MD  naloxone  (NARCAN ) 4 MG/0.1ML LIQD nasal spray kit Place 0.4 mg into the nose as needed (opiate OD).   Yes [provider]  omeprazole (PRILOSEC) 20 MG  capsule Take 20 mg by mouth at bedtime.   Yes [provider]  ondansetron  (ZOFRAN ) 4 MG tablet Take 4 mg by mouth every 8 (eight) hours as needed for nausea or vomiting.   Yes [provider]  oxybutynin  (DITROPAN -XL) 5 MG 24 hr tablet Take 5 mg by mouth in the morning and at bedtime.   Yes [provider]  oxyCODONE  (OXY IR/ROXICODONE ) 5 MG immediate release tablet Take 5 mg by mouth every 6 (six) hours as needed for moderate pain (pain score 4-6).   Yes [provider]  oxyCODONE  (OXYCONTIN ) 15 mg 12 hr tablet Take 15 mg by mouth in the morning, at noon, and at bedtime.   Yes [provider]  OXYGEN Inhale 2 L into the lungs as needed (to maintain sats above 90/SOB).   Yes [provider]  oxymetazoline  (AFRIN) 0.05 % nasal spray Place 1 spray into both nostrils 2 (two) times daily as needed for congestion.   Yes [provider]  pregabalin  (LYRICA ) 100 MG capsule Take 1 capsule (100 mg total) by mouth 2 (two) times daily. Patient taking differently: Take 100 mg by mouth  3 (three) times daily. 12/25/22 06/27/23 Yes Raenelle Coria, MD  pregabalin  (LYRICA ) 25 MG capsule Take 25 mg by mouth 3 (three) times daily.   Yes [provider]  QUEtiapine  (SEROQUEL ) 100 MG tablet Take 100 mg by mouth at bedtime.   Yes [provider]  senna (SENOKOT) 8.6 MG TABS tablet Take 2 tablets by mouth at bedtime.   Yes [provider]  sodium bicarbonate  650 MG tablet Take 650 mg by mouth 2 (two) times daily.   Yes [provider]  tamsulosin  (FLOMAX ) 0.4 MG CAPS capsule Take 2 capsules (0.8 mg total) by mouth daily. Patient taking differently: Take 0.4 mg by mouth daily. 02/09/23  Yes Lenard Calin, MD  tirzepatide [facility]) 2.5 MG/0.5ML Pen Inject 2.5 mg into the skin once a week. On Tuesdays   Yes [provider]  trolamine salicylate (ASPERCREME) 10 % cream Apply 1 Application topically in the morning, at noon, in the evening, and at bedtime. Right leg   Yes [provider]  UNABLE TO FIND Take 30 mLs by mouth in the morning and at bedtime. Med Name: liquid protein   Yes [provider]     Critical care time: 25 min

## 2023-07-01 NOTE — Progress Notes (Signed)
 OT Cancellation Note  Patient Details Name: Gregory [name] MRN: [id number] DOB: 1960-07-29   Cancelled Treatment:    Reason Eval/Treat Not Completed: Patient not medically ready (Pt intubated and sedated.)  Kennth Mliss Helling 07/01/2023, 7:57 AM Mliss HERO, OTR/L Acute Rehabilitation Services Office: [phone]

## 2023-07-01 NOTE — Progress Notes (Signed)
 Nutrition Follow-up  DOCUMENTATION CODES:  Not applicable  INTERVENTION:  Continue tube feeding via OGT: Vital 1.5 at 60 ml/h (1440 ml per day) Prosource TF20 60 ml BID Provides 2320 kcal, 137 gm protein, 1100 ml free water  daily 1 packet Juven BID, each packet provides 95 calories, 2.5 grams of protein (collagen), and micronutrients to support wound healing   NUTRITION DIAGNOSIS:  Inadequate oral intake related to inability to eat as evidenced by NPO status. - remains applicable   GOAL:  Patient will meet greater than or equal to 90% of their needs - progressing, enteral feeds infusing  MONITOR:   Vent status, Labs, Weight trends, TF tolerance, Skin, I & O's  REASON FOR ASSESSMENT:   Consult Enteral/tube feeding initiation and management  ASSESSMENT:   Pt to ED from SNF after being found unresponsive. Dx of severe UTI, RSV, and bilateral PNA. 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 PMH: COPD, a-fib, CKD 3, T2DM, HFeEF, HTN, HLD, colostomy. Noted with recent increased amount of liquid stool from his ostomy.  1/9 - presented to ED from SNF, intubated, imaging showed acute on chronic right PCA stroke 1/10 - LP attempted at bedside and in IR, unsuccessful   Patient is currently intubated on ventilator support. Tolerated trickle feeds over the weekend, ok to start advancing per CCM. Family at bedside, no nutrition related questions. RN reports good tolerance of feeds so far.   MV: 16.9 L/min Temp (24hrs), Avg:99.4 F (37.4 C), Min:98.3 F (36.8 C), Max:99.9 F (37.7 C)  Admit weight: 84 kg ? accuracy  First measured weight: 96.4 kg 1/10 Current weight: 91.2 kg  12.6% weight loss noted over the last 5 months prior to admission which is severe if accurate   Intake/Output Summary (Last 24 hours) at 07/01/2023 1007 Last data filed at 07/01/2023 9071 Gross per 24 hour  Intake 3348.43 ml  Output 3355 ml  Net -6.57 ml  Net IO Since Admission:  5,529.16 mL [07/01/23 1007]  Drains/Lines: CVC triple lumen Right IJ ileostomy out x 24 hours OGT UOP 2575 mL x 24 hours  Nutritionally Relevant Medications: Scheduled Meds:  atorvastatin   80 mg Per Tube Daily   doxycycline   100 mg Per Tube Q12H   famotidine   20 mg Per Tube BID   feeding supplement (PROSource TF20)  60 mL Per Tube BID   feeding supplement (VITAL 1.5 CAL)  1,000 mL Per Tube Q24H   fiber  1 packet Per Tube BID   insulin  aspart  0-6 Units Subcutaneous Q4H   Continuous Infusions:  sodium chloride  50 mL/hr at 07/01/23 0700   fentaNYL  infusion INTRAVENOUS 200 mcg/hr (07/01/23 0914)   meropenem  (MERREM ) IV 2,000 mg (07/01/23 9071)   midazolam  4 mg/hr (07/01/23 0914)   norepinephrine  (LEVOPHED ) Adult infusion Stopped (06/30/23 0335)   PRN Meds: docusate sodium , polyethylene glycol  Labs Reviewed: BUN 47, Creatinine 1.61 CBG ranges from 109-161 mg/dL over the last 24 hours HgbA1c 6.4%  NUTRITION - FOCUSED PHYSICAL EXAM: Flowsheet Row Most Recent Value  Orbital Region No depletion  Upper Arm Region Mild depletion  Thoracic and Lumbar Region No depletion  Buccal Region Unable to assess  Temple Region No depletion  Clavicle Bone Region No depletion  Clavicle and Acromion Bone Region No depletion  Scapular Bone Region Mild depletion  Dorsal Hand Unable to assess  Patellar Region Unable to assess  [R AKA, LLE w/ wounds and significant swelling]  Anterior Thigh Region Unable to assess  [edema]  Posterior Calf Region Unable to assess  [RAKA, unable to assess L d/t swelling/wounds]  Edema (RD Assessment) Severe  Hair Reviewed  Eyes Reviewed  Mouth Unable to assess  Skin Reviewed  Nails Unable to assess    Diet Order:   Diet Order             Diet NPO time specified  Diet effective now                   EDUCATION NEEDS:  Not appropriate for education at this time  Skin:  Skin Assessment: Reviewed RN Assessment  Last BM:  01/10  Height:   Ht Readings from Last 1 Encounters:  06/27/23 6' 0.01 (1.829 m)    Weight:  Wt Readings from Last 1 Encounters:  07/01/23 91.2 kg    Ideal Body Weight:  80.9 kg  BMI:  Body mass index is 27.26 kg/m.  Estimated Nutritional Needs:  Kcal:  2100-2300 kcal Protein:  120-130g Fluid:  >2L/day    Vernell Lukes, RD, LDN Registered Dietitian II Please reach out via secure chat Weekend on-call pager # available in [facility]

## 2023-07-01 NOTE — Progress Notes (Signed)
 STROKE TEAM PROGRESS NOTE   SUBJECTIVE (INTERVAL HISTORY) His RN is at the bedside. Pt remains intubated sedated but sedation is being weaned.  He slightly more responsive today and partially opens eyes but will not follow gaze consistently though he has some spontaneous roving eye movements.  He has trace withdrawal in the extremities to painful stimuli.  Vital signs stable.  Carotid ultrasound and transcranial Doppler are yet pending  OBJECTIVE Temp:  [98.3 F (36.8 C)-100.8 F (38.2 C)] 100.8 F (38.2 C) (01/13 1229) Pulse Rate:  [75-132] 132 (01/13 1229) Cardiac Rhythm: Heart block (01/13 0400) Resp:  [15-33] 33 (01/13 1229) BP: (99-189)/(50-87) 168/77 (01/13 1229) SpO2:  [94 %-100 %] 100 % (01/13 1137) FiO2 (%):  [40 %] 40 % (01/13 1137) Weight:  [91.2 kg] 91.2 kg (01/13 0450)  Recent Labs  Lab 06/30/23 1905 06/30/23 2310 07/01/23 0313 07/01/23 0743 07/01/23 1124  GLUCAP 129* 122* 150* 161* 154*   Recent Labs  Lab 06/28/23 0305 06/28/23 0306 06/28/23 0803 06/28/23 1941 06/29/23 0403 06/30/23 0358 07/01/23 0542  NA 133*   < > 135 133* 135 140 145  K 5.7*   < > 5.1 4.5 4.4 4.5 4.1  CL 104  --   --  104 105 109 117*  CO2 18*  --   --  18* 18* 21* 22  GLUCOSE 139*  --   --  158* 124* 117* 180*  BUN 53*  --   --  54* 55* 52* 47*  CREATININE 2.62*  --   --  2.56* 2.41* 2.08* 1.61*  CALCIUM  8.1*  --   --  8.3* 8.4* 9.1 8.6*  MG 1.5*  --   --   --  2.3 2.2 2.0  PHOS 5.3*  --   --   --  4.5 3.9 2.9   < > = values in this interval not displayed.   Recent Labs  Lab 06/27/23 0558  AST 18  ALT 14  ALKPHOS 107  BILITOT 0.6  PROT 7.0  ALBUMIN  3.1*   Recent Labs  Lab 06/27/23 0558 06/27/23 0838 06/28/23 0305 06/28/23 0306 06/28/23 0803 06/29/23 0403 06/30/23 0358 07/01/23 0542  WBC 13.0*  --  27.5*  --   --  10.0 7.3 4.2  NEUTROABS 11.0*  --   --   --   --   --   --   --   HGB 11.7*   < > 10.9* 11.2* 10.5* 8.7* 8.5* 6.9*  HCT 38.1*   < > 35.1* 33.0* 31.0*  27.4* 26.9* 21.3*  MCV 95.0  --  92.6  --   --  91.0 91.2 92.2  PLT 311  --  280  --   --  147* 135* 100*   < > = values in this interval not displayed.   No results for input(s): CKTOTAL, CKMB, CKMBINDEX, TROPONINI in the last 168 hours. No results for input(s): LABPROT, INR in the last 72 hours.  No results for input(s): COLORURINE, LABSPEC, PHURINE, GLUCOSEU, HGBUR, BILIRUBINUR, KETONESUR, PROTEINUR, UROBILINOGEN, NITRITE, LEUKOCYTESUR in the last 72 hours.  Invalid input(s): APPERANCEUR      Component Value Date/Time   CHOL 107 06/29/2023 0403   TRIG 521 (H) 06/29/2023 0403   HDL <10 (L) 06/29/2023 0403   CHOLHDL NOT CALCULATED 06/29/2023 0403   VLDL UNABLE TO CALCULATE IF TRIGLYCERIDE OVER 400 mg/dL 98/88/7974 9596   LDLCALC UNABLE TO CALCULATE IF TRIGLYCERIDE OVER 400 mg/dL 98/88/7974 9596   Lab Results  Component  Value Date   HGBA1C 6.4 (H) 06/27/2023      Component Value Date/Time   LABOPIA NONE DETECTED 06/27/2023 0748   COCAINSCRNUR NONE DETECTED 06/27/2023 0748   LABBENZ NONE DETECTED 06/27/2023 0748   AMPHETMU NONE DETECTED 06/27/2023 0748   THCU NONE DETECTED 06/27/2023 0748   LABBARB NONE DETECTED 06/27/2023 0748    No results for input(s): ETH in the last 168 hours.  I have personally reviewed the radiological images below and agree with the radiology interpretations.  DG FL GUIDED LUMBAR PUNCTURE Result Date: 07/01/2023 CLINICAL DATA:  63 year old male with altered mental status. Lumbar puncture requested. EXAM: LUMBAR PUNCTURE UNDER FLUOROSCOPY PROCEDURE: An appropriate skin entry site was determined fluoroscopically. Operator donned sterile gloves and mask. Skin site was marked, then prepped with Betadine , draped in usual sterile fashion, and infiltrated locally with 1% lidocaine . A 20 gauge spinal needle advancement was attempted at L3-L4, L4-L5, and L5-S1 by this provider as well as additional attempts at L2-L3 and L3-L4  by Dr. Marthann without return of CSF. After multiple unsuccessful attempts the needle was removed. The patient tolerated the procedure well and there were no complications. FLUOROSCOPY: Radiation Exposure Index (as provided by the fluoroscopic device): 105.3 mGy Kerma IMPRESSION: Unsuccessful lumbar puncture under fluoroscopy. This exam was performed by Solmon Ku [name], and was supervised and interpreted by Rockey Marthann, MD. Electronically Signed   By: Rockey Marthann M.D.   On: 07/01/2023 08:14   DG Abd 1 View Result Date: 06/30/2023 CLINICAL DATA:  Ileus. EXAM: ABDOMEN - 1 VIEW COMPARISON:  06/27/2023 FINDINGS: The marked gaseous dilatation of the stomach seen on the previous study has resolved in the interval. No gaseous small bowel or colonic dilatation on the current study. NG tube tip is in the stomach with proximal side port below the GE junction. Rectal temperature probe overlies the low pelvis. IMPRESSION: Interval resolution of gaseous dilatation of the stomach. No gaseous small bowel or colonic dilatation on the current study. Electronically Signed   By: Camellia Candle M.D.   On: 06/30/2023 06:34   DG CHEST PORT 1 VIEW Result Date: 06/30/2023 CLINICAL DATA:  63 year old male with respiratory failure, ventilator dependent. EXAM: PORTABLE CHEST 1 VIEW COMPARISON:  Portable chest 06/28/2023 and earlier. FINDINGS: Portable AP semi upright view at 0421 hours. Endotracheal tube tip in good position between the clavicles and carina. Stable right IJ central line. Enteric tube loops in the stomach. Lung volumes and mediastinal contours are stable and within normal limits. Mildly rotated to the left. Partially visible cervical spine fusion hardware. Regressed bilateral lower lung consolidation, perihilar opacity since 06/27/2023. No areas of worsening ventilation since that time. No pneumothorax or pleural effusion. No pulmonary edema. Paucity of bowel gas.  Stable visualized osseous structures. IMPRESSION: 1.   Stable lines and tubes. 2. Regressed bilateral consolidation/pneumonia since 06/27/2023. Bilateral improved ventilation. No new cardiopulmonary abnormality. Electronically Signed   By: VEAR Hurst M.D.   On: 06/30/2023 05:42   ECHOCARDIOGRAM COMPLETE Result Date: 06/28/2023    ECHOCARDIOGRAM REPORT   Patient Name:   Gregory [name] Date of Exam: 06/28/2023 Medical Rec #:  [id number]    Height:       72.0 in Accession #:    [id number]   Weight:       212.5 lb Date of Birth:  02/15/1961   BSA:          2.186 m Patient Age:    62 years     BP:  101/58 mmHg Patient Gender: M            HR:           90 bpm. Exam Location:  Inpatient Procedure: 2D Echo, Cardiac Doppler and Color Doppler Indications:    Stroke I63.9  History:        Patient has prior history of Echocardiogram examinations, most                 recent 12/06/2022. CHF, COPD, Stroke and CKD, stage 3,                 Signs/Symptoms:Shortness of Breath; Risk Factors:Hypertension,                 Diabetes, Current Smoker and Dyslipidemia.  Sonographer:    Thea Norlander RCS Referring Phys: [id number] ASHISH ARORA IMPRESSIONS  1. Left ventricular ejection fraction, by estimation, is 65 to 70%. The left ventricle has hyperdynamic function. The left ventricle has no regional wall motion abnormalities. Left ventricular diastolic parameters were normal.  2. Right ventricular systolic function is normal. The right ventricular size is mildly enlarged. Tricuspid regurgitation signal is inadequate for assessing PA pressure.  3. The mitral valve is normal in structure. No evidence of mitral valve regurgitation. No evidence of mitral stenosis.  4. The aortic valve is normal in structure. There is mild calcification of the aortic valve. There is mild thickening of the aortic valve. Aortic valve regurgitation is mild. Aortic valve sclerosis/calcification is present, without any evidence of aortic stenosis.  5. The inferior vena cava is normal in size with <50% respiratory  variability, suggesting right atrial pressure of 8 mmHg. FINDINGS  Left Ventricle: Left ventricular ejection fraction, by estimation, is 65 to 70%. The left ventricle has hyperdynamic function. The left ventricle has no regional wall motion abnormalities. The left ventricular internal cavity size was small. There is no  left ventricular hypertrophy. Left ventricular diastolic parameters were normal. Right Ventricle: The right ventricular size is mildly enlarged. No increase in right ventricular wall thickness. Right ventricular systolic function is normal. Tricuspid regurgitation signal is inadequate for assessing PA pressure. Left Atrium: Left atrial size was normal in size. Right Atrium: Right atrial size was normal in size. Pericardium: There is no evidence of pericardial effusion. Mitral Valve: The mitral valve is normal in structure. No evidence of mitral valve regurgitation. No evidence of mitral valve stenosis. Tricuspid Valve: The tricuspid valve is normal in structure. Tricuspid valve regurgitation is trivial. No evidence of tricuspid stenosis. Aortic Valve: The aortic valve is normal in structure. There is mild calcification of the aortic valve. There is mild thickening of the aortic valve. Aortic valve regurgitation is mild. Aortic valve sclerosis/calcification is present, without any evidence of aortic stenosis. Aortic valve mean gradient measures 10.0 mmHg. Aortic valve peak gradient measures 20.2 mmHg. Aortic valve area, by VTI measures 2.25 cm. Pulmonic Valve: The pulmonic valve was normal in structure. Pulmonic valve regurgitation is not visualized. No evidence of pulmonic stenosis. Aorta: The aortic root and ascending aorta are structurally normal, with no evidence of dilitation. Venous: The inferior vena cava is normal in size with less than 50% respiratory variability, suggesting right atrial pressure of 8 mmHg. IAS/Shunts: No atrial level shunt detected by color flow Doppler.  LEFT VENTRICLE  PLAX 2D LVIDd:         5.00 cm   Diastology LVIDs:         3.60 cm   LV e' medial:  7.72 cm/s LV PW:         1.00 cm   LV E/e' medial:  9.5 LV IVS:        0.80 cm   LV e' lateral:   12.80 cm/s LVOT diam:     2.40 cm   LV E/e' lateral: 5.7 LV SV:         74 LV SV Index:   34 LVOT Area:     4.52 cm  RIGHT VENTRICLE             IVC RV S prime:     14.80 cm/s  IVC diam: 2.80 cm TAPSE (M-mode): 2.9 cm LEFT ATRIUM             Index        RIGHT ATRIUM           Index LA diam:        3.60 cm 1.65 cm/m   RA Area:     15.90 cm LA Vol (A2C):   28.8 ml 13.17 ml/m  RA Volume:   41.60 ml  19.03 ml/m LA Vol (A4C):   20.3 ml 9.29 ml/m LA Biplane Vol: 24.3 ml 11.11 ml/m  AORTIC VALVE AV Area (Vmax):    2.08 cm AV Area (Vmean):   2.16 cm AV Area (VTI):     2.25 cm AV Vmax:           224.50 cm/s AV Vmean:          144.500 cm/s AV VTI:            0.329 m AV Peak Grad:      20.2 mmHg AV Mean Grad:      10.0 mmHg LVOT Vmax:         103.33 cm/s LVOT Vmean:        69.033 cm/s LVOT VTI:          0.163 m LVOT/AV VTI ratio: 0.50  AORTA Ao Root diam: 3.40 cm Ao Asc diam:  3.40 cm MITRAL VALVE               TRICUSPID VALVE MV Area (PHT): 4.21 cm    TR Peak grad:   11.6 mmHg MV Decel Time: 180 msec    TR Vmax:        170.00 cm/s MV E velocity: 73.50 cm/s MV A velocity: 72.70 cm/s  SHUNTS MV E/A ratio:  1.01        Systemic VTI:  0.16 m                            Systemic Diam: 2.40 cm Morene Brownie Electronically signed by Morene Brownie Signature Date/Time: 06/28/2023/2:33:49 PM    Final    Overnight EEG with video Result Date: 06/28/2023 Shelton Arlin KIDD, MD     06/29/2023  9:12 AM Patient Name: TAJH LIVSEY MRN: [id number] Epilepsy Attending: Arlin KIDD Shelton Referring Physician/Provider: Judithe Rocky BROCKS, NP Duration: 06/27/2023 2356 to 06/29/2023 0200 Patient history: 63yo M with ams getting eeg to evaluate for seizure Level of alertness: comatose AEDs during EEG study: Propofol  Technical aspects: This EEG study was done with  scalp electrodes positioned according to the 10-20 International system of electrode placement. Electrical activity was reviewed with band pass filter of 1-70Hz , sensitivity of 7 uV/mm, display speed of 78mm/sec with a 60Hz  notched filter applied as appropriate. EEG data were recorded continuously and digitally stored.  Video  monitoring was available and reviewed as appropriate. Description: EEG showed continuous generalized 3 to 6 Hz theta-delta slowing. Hyperventilation and photic stimulation were not performed.   EEG was disconnected between 06/28/2023 1436 to 1639 ABNORMALITY - Continuous slow, generalized IMPRESSION: This study is suggestive of severe diffuse encephalopathy. No seizures or epileptiform discharges were seen throughout the recording. Arlin MALVA Krebs   DG Chest Port 1 View Result Date: 06/28/2023 CLINICAL DATA:  [id number]. Encounter for pneumonia. Ventilator dependent respiratory failure. EXAM: PORTABLE CHEST 1 VIEW COMPARISON:  Chest CT without contrast yesterday at 7:51 p.m. FINDINGS: 3:56 a.m. ETT tip is 5 cm from the carina, NGT curves around to the left in the stomach with the tip in the upper medial lumen. Right IJ central line tip at the superior cavoatrial junction. Widespread right-greater-than-left airspace disease is again noted, with relative sparing in the left upper lung field. No pleural effusion is seen. There is mild cardiomegaly, mild central vascular prominence. Stable mediastinum. There is aortic atherosclerosis.  No acute or new osseous findings. IMPRESSION: 1. Widespread right-greater-than-left airspace disease, with relative sparing in the left upper lung field. No new or worsening infiltrate. 2. Mild cardiomegaly with mild central vascular prominence. 3. Support apparatus as above. 4. Aortic atherosclerosis. Electronically Signed   By: Francis Quam M.D.   On: 06/28/2023 05:58   MR BRAIN WO CONTRAST Result Date: 06/27/2023 CLINICAL DATA:  Initial evaluation for acute  neuro deficit, stroke suspected. EXAM: MRI HEAD WITHOUT CONTRAST TECHNIQUE: Multiplanar, multiecho pulse sequences of the brain and surrounding structures were obtained without intravenous contrast. COMPARISON:  Prior study from earlier the same day. FINDINGS: Brain: Age-related cerebral atrophy. Patchy and confluent T2/FLAIR hyperintensity involving the periventricular deep white matter both cerebral hemispheres, consistent with chronic small vessel ischemic disease, moderately advanced. Patchy involvement of the pons. Encephalomalacia and gliosis involving the right occipital lobe, consistent with a chronic left PCA distribution infarct. Associated chronic hemosiderin staining within this region. Superimposed restricted diffusion within the right temporal occipital region, consistent with an acute on chronic right PCA distribution infarct. Patchy involvement of the right thalamus. No associated hemorrhage or significant regional mass effect. No other evidence for acute or subacute ischemia. No acute intracranial hemorrhage. Chronic microhemorrhage at the anterior right temporal lobe noted. No mass lesion, midline shift or mass effect. No hydrocephalus or extra-axial fluid collection. Pituitary gland suprasellar region within normal limits. Vascular: Major intracranial vascular flow voids are maintained. Skull and upper cervical spine: Cranial junctional is. Postoperative changes partially visualize within the cervical spine. Bone marrow signal intensity normal. No scalp soft tissue abnormality. Sinuses/Orbits: Globes orbital soft tissues within normal limits. Mucosal thickening noted about the sphenoid ethmoidal and maxillary sinuses. Superimposed air-fluid level within the right maxillary sinus. Small bilateral mastoid effusions. Patient is intubated. Other: None. IMPRESSION: 1. 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 No associated hemorrhage or significant  regional mass effect. 2. Underlying age-related cerebral atrophy with moderate chronic microvascular ischemic disease. Electronically Signed   By: Morene Hoard M.D.   On: 06/27/2023 21:28   CT CHEST ABDOMEN PELVIS WO CONTRAST Result Date: 06/27/2023 CLINICAL DATA:  Pneumonia, sepsis EXAM: CT CHEST, ABDOMEN AND PELVIS WITHOUT CONTRAST TECHNIQUE: Multidetector CT imaging of the chest, abdomen and pelvis was performed following the standard protocol without IV contrast. RADIATION DOSE REDUCTION: This exam was performed according to the departmental dose-optimization program which includes automated exposure control, adjustment of the mA and/or kV according to patient size and/or  use of iterative reconstruction technique. COMPARISON:  CT abdomen and pelvis 02/08/2023. FINDINGS: CT CHEST FINDINGS Cardiovascular: Heart is normal size. Aorta is normal caliber. Moderate coronary artery and aortic calcifications. Mediastinum/Nodes: No mediastinal, hilar, or axillary adenopathy. Trachea and esophagus are unremarkable. Endotracheal tube tip in the midtrachea. Thyroid unremarkable. Lungs/Pleura: Dense consolidation in both lower lobes compatible with pneumonia. No effusions. Patchy ground-glass opacities in the upper lobes as well. Musculoskeletal: Chest wall soft tissues are unremarkable. No acute bony abnormality. CT ABDOMEN PELVIS FINDINGS Hepatobiliary: Small layering gallstones within the gallbladder. No focal hepatic abnormality or biliary ductal dilatation. Pancreas: No focal abnormality or ductal dilatation. Spleen: No focal abnormality.  Normal size. Adrenals/Urinary Tract: Foley catheter in the bladder which is decompressed. No renal or adrenal mass. No stones or hydronephrosis. Stomach/Bowel: Prior colectomy. Right lower quadrant ostomy noted. No bowel obstruction. NG tube is in the stomach. Vascular/Lymphatic: Aortic atherosclerosis. No evidence of aneurysm or adenopathy. Reproductive: Mildly prominent  prostate. Other: No free fluid or free air. Musculoskeletal: No acute bony abnormality. IMPRESSION: Dense consolidation in both lower lobes with patchy opacities in the upper lobes. Findings compatible with multifocal pneumonia. Coronary artery disease, aortic atherosclerosis. Right lower quadrant ostomy.  No evidence of bowel obstruction. Cholelithiasis.  No CT evidence for acute cholecystitis. Electronically Signed   By: Franky Crease M.D.   On: 06/27/2023 20:01   DG CHEST PORT 1 VIEW Result Date: 06/27/2023 CLINICAL DATA:  Central line placement. EXAM: PORTABLE CHEST 1 VIEW COMPARISON:  Same day. FINDINGS: Endotracheal and nasogastric tubes are unchanged. Interval placement of right internal jugular catheter with distal tip in expected position of the SVC. No pneumothorax is noted. Stable diffuse right lung interstitial opacity is noted concerning for asymmetric edema or atypical inflammation. IMPRESSION: Interval placement of right internal jugular catheter with distal tip in expected position of the SVC. Electronically Signed   By: Lynwood Landy Raddle M.D.   On: 06/27/2023 12:40   CT HEAD WO CONTRAST ( ) Result Date: 06/27/2023 CLINICAL DATA:  Altered mental status. EXAM: CT HEAD WITHOUT CONTRAST TECHNIQUE: Contiguous axial images were obtained from the base of the skull through the vertex without intravenous contrast. RADIATION DOSE REDUCTION: This exam was performed according to the departmental dose-optimization program which includes automated exposure control, adjustment of the mA and/or kV according to patient size and/or use of iterative reconstruction technique. COMPARISON:  CT head dated February 08, 2023. FINDINGS: Brain: New loss of the normal gray-white matter differentiation along the inferior right temporal lobe (series 3, image 14; series 5, images 35-42). No evidence of hemorrhage, hydrocephalus, or extra-axial collection. Old right occipital lobe infarct again noted. Vascular: Calcified  atherosclerosis at the skull base. No hyperdense vessel. Skull: Normal. Negative for fracture or focal lesion. Sinuses/Orbits: No acute finding. Other: Similar postsurgical changes right pre maxillary soft tissues. IMPRESSION: 1. New loss of the normal gray-white matter differentiation along the inferior right temporal lobe, concerning for acute infarct. No hemorrhage. Consider further evaluation with MRI. 2. Old right occipital lobe infarct. Electronically Signed   By: Elsie ONEIDA Shoulder M.D.   On: 06/27/2023 11:00   DG Chest Port 1 View Result Date: 06/27/2023 CLINICAL DATA:  Ventilator dependent respiratory failure. [id number] encounter for orogastric tube placement. EXAM: PORTABLE ABDOMEN - 1 VIEW; PORTABLE CHEST - 1 VIEW COMPARISON:  Portable chest today at 6:10 a.m., abdomen and pelvis CT 02/08/2023 FINDINGS: Chest AP portable 6:48 a.m.: Interval intubation with tip of the ETT 4.6 cm from carina. NGT interval insertion. The  heart is enlarged. There is worsening central vascular congestion and perihilar edema. There is increased right-greater-than-left perihilar interstitial Dyane most likely due to ground-glass edema. The more peripheral lungs are essentially clear. Small pleural effusions are forming. The mediastinum is stable.  No new osseous abnormality. Flat plate single anteverted view, portable at 6:51 a.m.: NGT curves to the left and cephalad within the stomach with the tip along the proximal fundus. Compared with CT 02/08/2023 there is severe dilatation of the stomach with air and food products. Correlate clinically for gastric outlet obstruction or impaired gastric emptying. The bowel pattern appears nonobstructive. There is no supine evidence of free air. No other significant radiographic findings. IMPRESSION: 1. Interval intubation with tip of the ETT 4.6 cm from carina. 2. NGT curves to the left and cephalad within the stomach with the tip along the proximal fundus. 3. Compared with CT 02/08/2023 there  is severe dilatation of the stomach with air and food products. Correlate clinically for gastric outlet obstruction or impaired gastric emptying. 4. Worsening central vascular congestion and perihilar edema. 5. Developing small pleural effusions. Electronically Signed   By: Francis Quam M.D.   On: 06/27/2023 07:27   DG Abd Portable 1V Result Date: 06/27/2023 CLINICAL DATA:  Ventilator dependent respiratory failure. [id number] encounter for orogastric tube placement. EXAM: PORTABLE ABDOMEN - 1 VIEW; PORTABLE CHEST - 1 VIEW COMPARISON:  Portable chest today at 6:10 a.m., abdomen and pelvis CT 02/08/2023 FINDINGS: Chest AP portable 6:48 a.m.: Interval intubation with tip of the ETT 4.6 cm from carina. NGT interval insertion. The heart is enlarged. There is worsening central vascular congestion and perihilar edema. There is increased right-greater-than-left perihilar interstitial Dyane most likely due to ground-glass edema. The more peripheral lungs are essentially clear. Small pleural effusions are forming. The mediastinum is stable.  No new osseous abnormality. Flat plate single anteverted view, portable at 6:51 a.m.: NGT curves to the left and cephalad within the stomach with the tip along the proximal fundus. Compared with CT 02/08/2023 there is severe dilatation of the stomach with air and food products. Correlate clinically for gastric outlet obstruction or impaired gastric emptying. The bowel pattern appears nonobstructive. There is no supine evidence of free air. No other significant radiographic findings. IMPRESSION: 1. Interval intubation with tip of the ETT 4.6 cm from carina. 2. NGT curves to the left and cephalad within the stomach with the tip along the proximal fundus. 3. Compared with CT 02/08/2023 there is severe dilatation of the stomach with air and food products. Correlate clinically for gastric outlet obstruction or impaired gastric emptying. 4. Worsening central vascular congestion and perihilar  edema. 5. Developing small pleural effusions. Electronically Signed   By: Francis Quam M.D.   On: 06/27/2023 07:27   DG Chest Port 1 View Result Date: 06/27/2023 CLINICAL DATA:  Unresponsive. EXAM: PORTABLE CHEST 1 VIEW COMPARISON:  02/08/2023 FINDINGS: The cardio pericardial silhouette is enlarged. Vascular congestion diffuse noted with diffuse interstitial opacity suggesting edema. No focal consolidation. No pneumothorax or pleural effusion. Gaseous distention of stomach or bowel is seen in the the left hemidiaphragm. Telemetry leads overlie the chest. IMPRESSION: Enlargement of the cardiopericardial silhouette with vascular congestion and diffuse interstitial opacity suggesting edema. Gaseous distention of stomach or bowel noted under the left hemidiaphragm. Electronically Signed   By: Camellia Candle M.D.   On: 06/27/2023 06:32     PHYSICAL EXAM  Temp:  [98.3 F (36.8 C)-100.8 F (38.2 C)] 100.8 F (38.2 C) (01/13 1229) Pulse Rate:  [  75-132] 132 (01/13 1229) Resp:  [15-33] 33 (01/13 1229) BP: (99-189)/(50-87) 168/77 (01/13 1229) SpO2:  [94 %-100 %] 100 % (01/13 1137) FiO2 (%):  [40 %] 40 % (01/13 1137) Weight:  [91.2 kg] 91.2 kg (01/13 0450)  General - Well nourished, well developed, intubated on sedation.  Ophthalmologic - fundi not visualized due to noncooperation.  Cardiovascular - Regular rate and tachycardia.  Neuro - intubated sedated  ., eyes closed, opens eyes to sternal rub briefly.  Not times consistently following commands.  Has spontaneous side-to-side eye movements., not blinking to visual threat, doll's eyes absent, not tracking, PERRL. Corneal reflex positive on the left but absent on the right, gag and cough present. Breathing over the vent.  Facial symmetry not able to test due to ET tube.   On pain stimulation, BUE  withdraws left more than right, LLE no significant movement, redness on the LLE skin. RLE AKA with slight movement with pain stimulation. Sensation,  coordination and gait not tested.   ASSESSMENT/PLAN Gregory [name] is a 63 y.o. male with history of DM, HTN, HLD, left eye blind, right occipital infarct with left HH, mesenteric ischemia s/p colostomy, RLE AKA, wheelchair bound living in SNF admitted for AMS, obtundation. He was intubated for nausea vomiting and not able to protect airway. Found to have high grade fever and in septic shock. No TNK given due to time onset unclear.    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 Old right occipital lobe infarct.  MRI  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 involving the right temporal occipital region. Transcranial Doppler pending Carotid Doppler  pending 2D Echo   ejection fraction 65 to 70% with hyperdynamic LV function.  Left atrium size normal LDL unable to calculate due to elevated triglycerides 521  HgbA1c 6.4 UDS neg Heparin  subq for VTE prophylaxis aspirin  81 mg daily prior to admission, now on aspirin  81 mg daily.  Ongoing aggressive stroke risk factor management Therapy recommendations:  pending Disposition:   as per  daughter, full code for now, palliative care on board  Sepsis/septic shock B/l Penumonia  UTI CT chest Dense consolidation in both lower lobes with patchy opacities in the upper lobes. Findings compatible with multifocal pneumonia. UA WBC > 50 On zosyn , vanco and azithromycin   Need to rule out meningitis/encephalitis LP failed at the bedside and with IR fluoroscopy as well.  On pressor support CCM on board  Respiratory failure Intubated on vent On sedation and pressor support Vent management per CCM  Hx of stroke 09/2022 subacute right occipital infarct due to right PCA occlusion with severe vasculopathy. MRA occlusion of the  distal right P1/proximal right P2 segment, without evidence of reconstitution of the right PCA branches. No DVT, EF 55-60%, A1C 6.9 and THC positive in urine. Put on ASA 81 but no plavix due to GIB and chronic anemia. Also on statin and tricor   Followed at [facility] in 11/2022  Diabetes HgbA1c 6.4 goal < 7.0 Controlled CBG monitoring SSI DM education and close PCP follow up  Hx of hypertension hypotension Low BP on presentation consistent with septic shock On pressure now Stable with pressure Stroke likely due to hypotension in the setting of right PCA occlusion Long term BP goal normotensive  Hyperlipidemia Home meds:  lipitor  80  LDL 29, goal < 70 Now on lipitor  80 Continue statin at discharge  Tobacco abuse Current smoker Smoking cessation counseling will be provided  Other Stroke Risk Factors PVD with RLE AKA Hx of substance abuse - THC  Other Active Problems Left eye blind since 2013 Mesenteric ischemia s/p colostomy 12/2022 with septic shock Full code for now  Hospital day # 4 Patient with prior history of right PCA infarct due to right PCA occlusion and diffuse intracranial atherosclerosis with history of recurrent GI bleed and not a good candidate for dual antiplatelet therapy.  He presented with septic shock fever elevated white count and extension of his right PCA infarct likely due to failure of collaterals due to hypotension and diffuse intracranial atherosclerosis.  I doubt he has meningitis given rapid improvement of his fever and white count with 1 day of antibiotics.   ID agree and have discontinued vancomycin , acyclovir  and azithromycin .  He is currently on meropenem  and doxycycline  has been added for pneumonia.  Continue weaning of sedation as tolerated to get a better neurological exam.  We are still awaiting transcranial Doppler and carotid ultrasound.  Use aspirin  alone due to GI bleed with known to dual antiplatelet therapy.  No family available at the bedside.   Patient prognosis is quite poor and consider palliative care options.  Discussed with Dr. Annella critical care medicine.  Daughter wants full aggressive care   This patient is critically ill and at significant risk of neurological worsening, death and care requires constant monitoring of vital signs, hemodynamics,respiratory and cardiac monitoring, extensive review of multiple databases, frequent neurological assessment, discussion with family, other specialists and medical decision making of high complexity.I have made any additions or clarifications directly to the above note.This critical care time does not reflect procedure time, or teaching time or supervisory time of PA/NP/Med Resident etc but could involve care discussion time.  I spent 30 minutes of neurocritical care time  in the care of  this patient.          Eather Popp, MD Stroke Neurology 07/01/2023 1:27 PM    To contact Stroke Continuity provider, please refer to [email or web address]. After hours, contact General Neurology

## 2023-07-01 NOTE — Progress Notes (Signed)
 [facility]   Reason for visit: Follow up on pneumonia  Interval History: he remains in ICU, intubated.  No response.   Physical Exam: Constitutional:  Vitals:   07/01/23 1137 07/01/23 1229  BP:  (!) 168/77  Pulse:  (!) 132  Resp:  (!) 33  Temp:  (!) 100.8 F (38.2 C)  SpO2: 100%    patient appears in NAD Respiratory: Normal respiratory effort  Review of Systems: Unable to be assessed due to patient factors  Lab Results  Component Value Date   WBC 4.2 07/01/2023   HGB 6.9 (LL) 07/01/2023   HCT 21.3 (L) 07/01/2023   MCV 92.2 07/01/2023   PLT 100 (L) 07/01/2023    Lab Results  Component Value Date   CREATININE 1.61 (H) 07/01/2023   BUN 47 (H) 07/01/2023   NA 145 07/01/2023   K 4.1 07/01/2023   CL 117 (H) 07/01/2023   CO2 22 07/01/2023    Lab Results  Component Value Date   ALT 14 06/27/2023   AST 18 06/27/2023   ALKPHOS 107 06/27/2023     Microbiology: Recent Results (from the past 240 hours)  Blood Culture (routine x 2)     Status: None (Preliminary result)   Collection Time: 06/27/23  6:00 AM   Specimen: BLOOD  Result Value Ref Range Status   Specimen Description BLOOD RIGHT ANTECUBITAL  Final   Special Requests   Final    BOTTLES DRAWN AEROBIC AND ANAEROBIC Blood Culture adequate volume   Culture   Final    NO GROWTH 4 DAYS Performed at [facility], [address], [place], [place] [address]    Report Status PENDING  Incomplete  Blood Culture (routine x 2)     Status: None (Preliminary result)   Collection Time: 06/27/23  6:03 AM   Specimen: BLOOD RIGHT HAND  Result Value Ref Range Status   Specimen Description BLOOD RIGHT HAND  Final   Special Requests   Final    BOTTLES DRAWN AEROBIC ONLY Blood Culture adequate volume   Culture   Final    NO GROWTH 4 DAYS Performed at [facility], [address], [place], [place] [address]    Report Status PENDING  Incomplete  Resp panel by RT-PCR (RSV, Flu A&B,  Covid) Anterior Nasal Swab     Status: None   Collection Time: 06/27/23  6:04 AM   Specimen: Anterior Nasal Swab  Result Value Ref Range Status   SARS Coronavirus 2 by RT PCR NEGATIVE NEGATIVE Final   Influenza A by PCR NEGATIVE NEGATIVE Final   Influenza B by PCR NEGATIVE NEGATIVE Final    Comment: (NOTE) The Xpert Xpress SARS-CoV-2/FLU/RSV plus assay is intended as an aid in the diagnosis of influenza from Nasopharyngeal swab specimens and should not be used as a sole basis for treatment. Nasal washings and aspirates are unacceptable for Xpert Xpress SARS-CoV-2/FLU/RSV testing.  Fact Sheet for Patients: bloggercourse.com  Fact Sheet for Healthcare Providers: seriousbroker.it  This test is not yet approved or cleared by the [place]  FDA and has been authorized for detection and/or diagnosis of SARS-CoV-2 by FDA under an Emergency Use Authorization (EUA). This EUA will remain in effect (meaning this test can be used) for the duration of the COVID-19 declaration under Section 564(b)(1) of the Act, 21 U.S.C. section 360bbb-3(b)(1), unless the authorization is terminated or revoked.     Resp Syncytial Virus by PCR NEGATIVE NEGATIVE Final  Comment: (NOTE) Fact Sheet for Patients: bloggercourse.com  Fact Sheet for Healthcare Providers: seriousbroker.it  This test is not yet approved or cleared by the [place]  FDA and has been authorized for detection and/or diagnosis of SARS-CoV-2 by FDA under an Emergency Use Authorization (EUA). This EUA will remain in effect (meaning this test can be used) for the duration of the COVID-19 declaration under Section 564(b)(1) of the Act, 21 U.S.C. section 360bbb-3(b)(1), unless the authorization is terminated or revoked.  Performed at [facility], [address], [place], [place] [address]   Urine Culture     Status:  Abnormal   Collection Time: 06/27/23  6:59 AM   Specimen: Urine, Random  Result Value Ref Range Status   Specimen Description URINE, RANDOM  Final   Special Requests   Final    NONE Reflexed from [id number] Performed at [facility], [address], [place], [place] [address]    Culture (A)  Final    >=100,000 COLONIES/mL ESCHERICHIA COLI Confirmed Extended Spectrum Beta-Lactamase Producer (ESBL).  In bloodstream infections from ESBL organisms, carbapenems are preferred over piperacillin /tazobactam. They are shown to have a lower risk of mortality.    Report Status 06/29/2023 FINAL  Final   Organism ID, Bacteria ESCHERICHIA COLI (A)  Final      Susceptibility   Escherichia coli - MIC*    AMPICILLIN  >=32 RESISTANT Resistant     CEFAZOLIN  >=64 RESISTANT Resistant     CEFEPIME  16 RESISTANT Resistant     CEFTRIAXONE  >=64 RESISTANT Resistant     CIPROFLOXACIN  >=4 RESISTANT Resistant     GENTAMICIN <=1 SENSITIVE Sensitive     IMIPENEM 0.5 SENSITIVE Sensitive     NITROFURANTOIN <=16 SENSITIVE Sensitive     TRIMETH /SULFA  >=320 RESISTANT Resistant     AMPICILLIN /SULBACTAM >=32 RESISTANT Resistant     PIP/TAZO 64 INTERMEDIATE Intermediate ug/mL    * >=100,000 COLONIES/mL ESCHERICHIA COLI  C Difficile Quick Screen w PCR reflex     Status: None   Collection Time: 06/27/23  8:21 AM   Specimen: STOOL  Result Value Ref Range Status   C Diff antigen NEGATIVE NEGATIVE Final   C Diff toxin NEGATIVE NEGATIVE Final   C Diff interpretation No C. difficile detected.  Final    Comment: Performed at [facility], [address], [place], [place] [address]  Gastrointestinal Panel by PCR , Stool     Status: None   Collection Time: 06/27/23  8:21 AM   Specimen: STOOL  Result Value Ref Range Status   Campylobacter species NOT DETECTED NOT DETECTED Final   Plesimonas shigelloides NOT DETECTED NOT DETECTED Final   Salmonella species NOT DETECTED NOT DETECTED Final   Yersinia enterocolitica NOT  DETECTED NOT DETECTED Final   Vibrio species NOT DETECTED NOT DETECTED Final   Vibrio cholerae NOT DETECTED NOT DETECTED Final   Enteroaggregative E coli (EAEC) NOT DETECTED NOT DETECTED Final   Enteropathogenic E coli (EPEC) NOT DETECTED NOT DETECTED Final   Enterotoxigenic E coli (ETEC) NOT DETECTED NOT DETECTED Final   Shiga like toxin producing E coli (STEC) NOT DETECTED NOT DETECTED Final   Shigella/Enteroinvasive E coli (EIEC) NOT DETECTED NOT DETECTED Final   Cryptosporidium NOT DETECTED NOT DETECTED Final   Cyclospora cayetanensis NOT DETECTED NOT DETECTED Final   Entamoeba histolytica NOT DETECTED NOT DETECTED Final   Giardia lamblia NOT DETECTED NOT DETECTED Final   Adenovirus F40/41 NOT DETECTED NOT DETECTED Final   Astrovirus NOT DETECTED NOT  DETECTED Final   Norovirus GI/GII NOT DETECTED NOT DETECTED Final   Rotavirus A NOT DETECTED NOT DETECTED Final   Sapovirus (I, II, IV, and V) NOT DETECTED NOT DETECTED Final    Comment: Performed at [facility], [address] [place], [place] [address]  Respiratory (~20 pathogens) panel by PCR     Status: Abnormal   Collection Time: 06/27/23  1:54 PM   Specimen: Nasopharyngeal Swab; Respiratory  Result Value Ref Range Status   Adenovirus NOT DETECTED NOT DETECTED Final   Coronavirus 229E NOT DETECTED NOT DETECTED Final    Comment: (NOTE) The Coronavirus on the Respiratory Panel, DOES NOT test for the novel  Coronavirus (2019 nCoV)    Coronavirus HKU1 NOT DETECTED NOT DETECTED Final   Coronavirus NL63 NOT DETECTED NOT DETECTED Final   Coronavirus OC43 NOT DETECTED NOT DETECTED Final   Metapneumovirus NOT DETECTED NOT DETECTED Final   Rhinovirus / Enterovirus DETECTED (A) NOT DETECTED Final   Influenza A NOT DETECTED NOT DETECTED Final   Influenza B NOT DETECTED NOT DETECTED Final   Parainfluenza Virus 1 NOT DETECTED NOT DETECTED Final   Parainfluenza Virus 2 NOT DETECTED NOT DETECTED Final   Parainfluenza Virus 3 NOT  DETECTED NOT DETECTED Final   Parainfluenza Virus 4 NOT DETECTED NOT DETECTED Final   Respiratory Syncytial Virus NOT DETECTED NOT DETECTED Final   Bordetella pertussis NOT DETECTED NOT DETECTED Final   Bordetella Parapertussis NOT DETECTED NOT DETECTED Final   Chlamydophila pneumoniae NOT DETECTED NOT DETECTED Final   Mycoplasma pneumoniae NOT DETECTED NOT DETECTED Final    Comment: Performed at [facility], [address], [place], [place] [address]  MRSA Next Gen by PCR, Nasal     Status: Abnormal   Collection Time: 06/27/23  2:51 PM   Specimen: Nasal Mucosa; Nasal Swab  Result Value Ref Range Status   MRSA by PCR Next Gen DETECTED (A) NOT DETECTED Final    Comment: RESULT CALLED TO, READ BACK BY AND VERIFIED WITH: RN NATE DAWKINS ON 06/27/23 @ 1825 BY DRT (NOTE) The GeneXpert MRSA Assay (FDA approved for NASAL specimens only), is one component of a comprehensive MRSA colonization surveillance program. It is not intended to diagnose MRSA infection nor to guide or monitor treatment for MRSA infections. Test performance is not FDA approved in patients less than 34 years old. Performed at [facility], [address] [place], [place] [address]   Culture, Respiratory w Gram Stain     Status: None   Collection Time: 06/28/23  3:06 AM   Specimen: Tracheal Aspirate; Respiratory  Result Value Ref Range Status   Specimen Description TRACHEAL ASPIRATE  Final   Special Requests NONE  Final   Gram Stain   Final    FEW WBC PRESENT, PREDOMINANTLY PMN FEW GRAM NEGATIVE RODS Performed at [facility], [address] [place] [address], [place], [place] [address]    Culture   Final    ABUNDANT KLEBSIELLA OXYTOCA ABUNDANT SERRATIA MARCESCENS    Report Status 07/01/2023 FINAL  Final   Organism ID, Bacteria KLEBSIELLA OXYTOCA  Final   Organism ID, Bacteria SERRATIA MARCESCENS  Final      Susceptibility   Klebsiella oxytoca - MIC*    AMPICILLIN  >=32 RESISTANT Resistant     CEFEPIME  <=0.12  SENSITIVE Sensitive     CEFTAZIDIME <=1 SENSITIVE Sensitive     CEFTRIAXONE  <=0.25 SENSITIVE Sensitive     CIPROFLOXACIN  <=0.25 SENSITIVE Sensitive     GENTAMICIN <=  1 SENSITIVE Sensitive     IMIPENEM <=0.25 SENSITIVE Sensitive     TRIMETH /SULFA  <=20 SENSITIVE Sensitive     AMPICILLIN /SULBACTAM >=32 RESISTANT Resistant     PIP/TAZO 16 SENSITIVE Sensitive ug/mL    * ABUNDANT KLEBSIELLA OXYTOCA   Serratia marcescens - MIC*    CEFEPIME  <=0.12 SENSITIVE Sensitive     CEFTAZIDIME <=1 SENSITIVE Sensitive     CEFTRIAXONE  >=64 RESISTANT Resistant     CIPROFLOXACIN  <=0.25 SENSITIVE Sensitive     GENTAMICIN <=1 SENSITIVE Sensitive     TRIMETH /SULFA  <=20 SENSITIVE Sensitive     * ABUNDANT SERRATIA MARCESCENS    Impression/Plan:  1. Pneumonia -he has consolidation bilaterally noted on CT scan on January 9 and some improvement on the chest x-ray yesterday along with respiratory viral panel with rhinovirus concerning for a viral pneumonia.  Potentially could have a superimposed bacterial infection based on the cultures from the trach aspirate though these may represent colonization.  At this point though I would have him continue with antibiotics and can use cefepime  alone and have made the change.  Would treat for 7 days total. No indication for doxycycline   2.  Encephalopathy.  This is secondary to his underlying CVA.  I agree that that meningeal encephalitis unlikely and no indication for in a biotics in this regard.  This has been discontinued.  3.  Palliative care.  Palliative care is seeing him and overall poor prognosis.  Full scope of care at this time and ongoing discussions been had with daughter.  I will otherwise sign off but call with any new considerations thanks.

## 2023-07-01 NOTE — Progress Notes (Signed)
   Palliative Medicine Inpatient Follow Up Note   HPI: 63 y.o. 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  He was intubated in the ED for airway protection.  Initial workup revealed elevated creatinine and mild leukocytosis.  CT head was abnormal with concern for acute infarct.  He is admitted with acute encephalopathy, right sided pneumonia, and sepsis.   Palliative Medicine is consulted for goals of care.  Today's Discussion 07/01/2023  *Please note that this is a verbal dictation therefore any spelling or grammatical errors are due to the Dragon Medical One system interpretation.  Chart reviewed inclusive of vital signs, progress notes, laboratory results, and diagnostic images. The primary team is working on weaning patient off sedation.  I met at bedside with Darina. He appears acutely ill, he slightly opens his eye though is otherwise not responsive.   Allowing time for outcomes. Patients daughter, [name] has requested some time per my colleague Recardo to consider the difficult decisions she will be faced with in the oncoming day(s) given patients poor neurological state.   Questions and concerns addressed/Palliative Support Provided.   Objective Assessment: Vital Signs Vitals:   07/01/23 1517 07/01/23 1519  BP: (!) 158/72   Pulse: 84   Resp: (!) 28   Temp: 99.7 F (37.6 C)   SpO2: 100% 100%    Intake/Output Summary (Last 24 hours) at 07/01/2023 1613 Last data filed at 07/01/2023 1453 Gross per 24 hour  Intake 3305.87 ml  Output 3230 ml  Net 75.87 ml   Last Weight  Most recent update: 07/01/2023  4:50 AM    Weight  91.2 kg (201 lb 1 oz)            Gen:  Acutely ill appearing M  HEENT: ETT, NGT, dry mucous membranes CV: Regular rate and rhythm  PULM:  On mechanical ventilator ABD:  soft/nontender  EXT: RLE AKA Neuro: Somnolent - on sedation  SUMMARY OF RECOMMENDATIONS   Full Code  Allowing time for outcomes  CCM is weaning off sedation  Ongoing conversations between PMT and patients daughter in the days ahead  Time Spent: 25 ______________________________________________________________________________________ Rosaline Becton [facility] Palliative Medicine Team Team Cell Phone: [phone] Please utilize secure chat with additional questions, if there is no response within 30 minutes please call the above phone number  Palliative Medicine Team providers are available by phone from 7am to 7pm daily and can be reached through the team cell phone.  Should this patient require assistance outside of these hours, please call the patient's attending physician.

## 2023-07-01 NOTE — Progress Notes (Signed)
 PT Cancellation Note  Patient Details Name: Gregory [name] MRN: [id number] DOB: 1961-02-16   Cancelled Treatment:    Reason Eval/Treat Not Completed: Patient not medically ready  Spoke with RN, pt still minimally interactive. Will hold another day and try for comprehensive PT evaluation tomorrow.  Leontine Roads, PT, DPT [facility]  Rehabilitation Services Physical Therapist Office: [phone] Website: [email or web address]  Leontine GORMAN Roads 07/01/2023, 10:26 AM

## 2023-07-01 NOTE — Progress Notes (Signed)
 eLink Physician-Brief Progress Note Patient Name: Gregory [name] DOB: 07/14/1960 MRN: [id number]   Date of Service  07/01/2023  HPI/Events of Note  Desychrony with vent ith peak pressusres on vent 33, BSRN asking for PRN paralytic, was given early this am for same problem,   maxed out on versed  8 and fent 400  eICU Interventions  On assessment he is now synchronous on vent with peak pressure 29 BSRN did give bronchodilators and suction but reportedly took awhile before he settled. Will continue to monitor     Intervention Category Intermediate Interventions: Respiratory distress - evaluation and management  Damien ONEIDA Grout 07/01/2023, 12:35 AM

## 2023-07-01 NOTE — Progress Notes (Signed)
 eLink Physician-Brief Progress Note Patient Name: Gregory [name] DOB: Jun 16, 1961 MRN: [id number]   Date of Service  07/01/2023  HPI/Events of Note  Hgb 6.9 from 8,5 No report of active bleed Not on pressors  eICU Interventions  Ordered to transfuse 1 unit PRBC BSRN to obtain consent for blood transfusion     Intervention Category Intermediate Interventions: Diagnostic test evaluation  Damien ONEIDA Grout 07/01/2023, 6:56 AM

## 2023-07-02 DIAGNOSIS — J9601 Acute respiratory failure with hypoxia: Secondary | ICD-10-CM | POA: Diagnosis not present

## 2023-07-02 DIAGNOSIS — I6621 Occlusion and stenosis of right posterior cerebral artery: Secondary | ICD-10-CM | POA: Diagnosis not present

## 2023-07-02 LAB — TYPE AND SCREEN
ABO/RH(D): O POS
Antibody Screen: NEGATIVE
Unit division: 0

## 2023-07-02 LAB — BPAM RBC
Blood Product Expiration Date: 202501172359
ISSUE DATE / TIME: 202501131031
Unit Type and Rh: 5100

## 2023-07-02 LAB — CULTURE, BLOOD (ROUTINE X 2)
Culture: NO GROWTH
Culture: NO GROWTH
Special Requests: ADEQUATE
Special Requests: ADEQUATE

## 2023-07-02 LAB — BASIC METABOLIC PANEL
Anion gap: 9 (ref 5–15)
BUN: 50 mg/dL — ABNORMAL HIGH (ref 8–23)
CO2: 20 mmol/L — ABNORMAL LOW (ref 22–32)
Calcium: 9.1 mg/dL (ref 8.9–10.3)
Chloride: 117 mmol/L — ABNORMAL HIGH (ref 98–111)
Creatinine, Ser: 1.37 mg/dL — ABNORMAL HIGH (ref 0.61–1.24)
GFR, Estimated: 58 mL/min — ABNORMAL LOW (ref >60)
Glucose, Bld: 183 mg/dL — ABNORMAL HIGH (ref 70–99)
Potassium: 4.1 mmol/L (ref 3.5–5.1)
Sodium: 146 mmol/L — ABNORMAL HIGH (ref 135–145)

## 2023-07-02 LAB — CBC
HCT: 26.6 % — ABNORMAL LOW (ref 39.0–52.0)
Hemoglobin: 8.6 g/dL — ABNORMAL LOW (ref 13.0–17.0)
MCH: 28.8 pg (ref 26.0–34.0)
MCHC: 32.3 g/dL (ref 30.0–36.0)
MCV: 89 fL (ref 80.0–100.0)
Platelets: 135 10*3/uL — ABNORMAL LOW (ref 150–400)
RBC: 2.99 MIL/uL — ABNORMAL LOW (ref 4.22–5.81)
RDW: 17.2 % — ABNORMAL HIGH (ref 11.5–15.5)
WBC: 5.9 10*3/uL (ref 4.0–10.5)
nRBC: 0 % (ref 0.0–0.2)

## 2023-07-02 LAB — GLUCOSE, CAPILLARY
Glucose-Capillary: 152 mg/dL — ABNORMAL HIGH (ref 70–99)
Glucose-Capillary: 162 mg/dL — ABNORMAL HIGH (ref 70–99)
Glucose-Capillary: 162 mg/dL — ABNORMAL HIGH (ref 70–99)
Glucose-Capillary: 179 mg/dL — ABNORMAL HIGH (ref 70–99)
Glucose-Capillary: 197 mg/dL — ABNORMAL HIGH (ref 70–99)
Glucose-Capillary: 213 mg/dL — ABNORMAL HIGH (ref 70–99)

## 2023-07-02 LAB — PHOSPHORUS: Phosphorus: 2.4 mg/dL — ABNORMAL LOW (ref 2.5–4.6)

## 2023-07-02 LAB — MAGNESIUM: Magnesium: 1.7 mg/dL (ref 1.7–2.4)

## 2023-07-02 MED ORDER — OXYCODONE HCL 5 MG PO TABS
20.0000 mg | ORAL_TABLET | Freq: Four times a day (QID) | ORAL | Status: DC
  Administered 2023-07-02 – 2023-07-06 (×16): 20 mg
  Filled 2023-07-02 (×16): qty 4

## 2023-07-02 MED ORDER — MIDAZOLAM HCL 2 MG/2ML IJ SOLN
INTRAMUSCULAR | Status: AC
  Filled 2023-07-02: qty 4

## 2023-07-02 MED ORDER — HALOPERIDOL LACTATE 5 MG/ML IJ SOLN
5.0000 mg | INTRAMUSCULAR | Status: DC | PRN
  Administered 2023-07-02 – 2023-07-06 (×4): 5 mg via INTRAVENOUS
  Filled 2023-07-02 (×3): qty 1

## 2023-07-02 MED ORDER — FENTANYL BOLUS VIA INFUSION
200.0000 ug | INTRAVENOUS | Status: DC | PRN
  Administered 2023-07-02 – 2023-07-05 (×14): 200 ug via INTRAVENOUS

## 2023-07-02 MED ORDER — FREE WATER
200.0000 mL | Status: DC
  Administered 2023-07-02 – 2023-07-03 (×7): 200 mL

## 2023-07-02 MED ORDER — MIDAZOLAM BOLUS VIA INFUSION
4.0000 mg | INTRAVENOUS | Status: DC | PRN
  Administered 2023-07-02: 4 mg via INTRAVENOUS

## 2023-07-02 MED ORDER — MAGNESIUM SULFATE 2 GM/50ML IV SOLN
2.0000 g | Freq: Once | INTRAVENOUS | Status: AC
  Administered 2023-07-02: 2 g via INTRAVENOUS
  Filled 2023-07-02: qty 50

## 2023-07-02 MED ORDER — PREGABALIN 25 MG PO CAPS
125.0000 mg | ORAL_CAPSULE | Freq: Three times a day (TID) | ORAL | Status: DC
  Administered 2023-07-02 – 2023-07-08 (×18): 125 mg
  Filled 2023-07-02 (×18): qty 1

## 2023-07-02 MED ORDER — MIDAZOLAM BOLUS VIA INFUSION
4.0000 mg | INTRAVENOUS | Status: DC | PRN
  Administered 2023-07-02 – 2023-07-03 (×4): 4 mg via INTRAVENOUS

## 2023-07-02 MED ORDER — LABETALOL HCL 5 MG/ML IV SOLN
10.0000 mg | INTRAVENOUS | Status: AC | PRN
Start: 2023-07-02 — End: ?
  Administered 2023-07-05 (×2): 10 mg via INTRAVENOUS
  Filled 2023-07-02 (×3): qty 4

## 2023-07-02 MED ORDER — MIDAZOLAM HCL 2 MG/2ML IJ SOLN
4.0000 mg | Freq: Once | INTRAMUSCULAR | Status: AC
  Administered 2023-07-02: 4 mg via INTRAVENOUS

## 2023-07-02 MED ORDER — QUETIAPINE FUMARATE 100 MG PO TABS
100.0000 mg | ORAL_TABLET | Freq: Every day | ORAL | Status: DC
  Administered 2023-07-02 – 2023-07-05 (×4): 100 mg
  Filled 2023-07-02 (×4): qty 1

## 2023-07-02 MED ORDER — HYDROMORPHONE HCL 1 MG/ML IJ SOLN
2.0000 mg | Freq: Once | INTRAMUSCULAR | Status: AC
Start: 2023-07-02 — End: 2023-07-02
  Administered 2023-07-02: 2 mg via INTRAVENOUS
  Filled 2023-07-02: qty 2

## 2023-07-02 MED ORDER — HYDRALAZINE HCL 10 MG PO TABS
10.0000 mg | ORAL_TABLET | Freq: Three times a day (TID) | ORAL | Status: DC
  Administered 2023-07-02 – 2023-07-08 (×16): 10 mg
  Filled 2023-07-02 (×21): qty 1

## 2023-07-02 MED ORDER — CLONAZEPAM 1 MG PO TABS
1.0000 mg | ORAL_TABLET | Freq: Three times a day (TID) | ORAL | Status: DC
  Administered 2023-07-02 – 2023-07-03 (×4): 1 mg
  Filled 2023-07-02 (×4): qty 1

## 2023-07-02 MED ORDER — OXYCODONE HCL 5 MG PO TABS
10.0000 mg | ORAL_TABLET | Freq: Four times a day (QID) | ORAL | Status: DC
  Administered 2023-07-02: 10 mg
  Filled 2023-07-02: qty 2

## 2023-07-02 MED ORDER — HALOPERIDOL LACTATE 5 MG/ML IJ SOLN
INTRAMUSCULAR | Status: AC
  Filled 2023-07-02: qty 1

## 2023-07-02 MED ORDER — POTASSIUM & SODIUM PHOSPHATES 280-160-250 MG PO PACK
2.0000 | PACK | ORAL | Status: AC
  Administered 2023-07-02 (×2): 2
  Filled 2023-07-02 (×2): qty 2

## 2023-07-02 NOTE — Plan of Care (Signed)
   Problem: Fluid Volume: Goal: Ability to maintain a balanced intake and output will improve Outcome: Progressing   Problem: Nutritional: Goal: Maintenance of adequate nutrition will improve Outcome: Progressing

## 2023-07-02 NOTE — Plan of Care (Signed)
  Problem: Ischemic Stroke/TIA Tissue Perfusion: Goal: Complications of ischemic stroke/TIA will be minimized 07/02/2023 1720 by Janean Lucienne PARAS, RN Outcome: Progressing 07/02/2023 1608 by Janean Lucienne PARAS, RN Outcome: Progressing   Problem: Education: Goal: Knowledge of disease or condition will improve 07/02/2023 1720 by Janean Lucienne PARAS, RN Outcome: Not Progressing 07/02/2023 1608 by Janean Lucienne PARAS, RN Outcome: Not Progressing Goal: Knowledge of secondary prevention will improve (MUST DOCUMENT ALL) 07/02/2023 1720 by Janean Lucienne PARAS, RN Outcome: Not Progressing 07/02/2023 1608 by Janean Lucienne PARAS, RN Outcome: Not Progressing Goal: Knowledge of patient specific risk factors will improve Alonso N/A or DELETE if not current risk factor) 07/02/2023 1720 by Janean Lucienne PARAS, RN Outcome: Not Progressing 07/02/2023 1608 by Janean Lucienne PARAS, RN Outcome: Not Progressing   Problem: Health Behavior/Discharge Planning: Goal: Ability to manage health-related needs will improve 07/02/2023 1720 by Janean Lucienne PARAS, RN Outcome: Progressing 07/02/2023 1608 by Janean Lucienne PARAS, RN Outcome: Progressing Goal: Goals will be collaboratively established with patient/family 07/02/2023 1720 by Janean Lucienne PARAS, RN Outcome: Progressing 07/02/2023 1608 by Janean Lucienne PARAS, RN Outcome: Progressing

## 2023-07-02 NOTE — Plan of Care (Signed)
  Problem: Safety: Goal: Non-violent Restraint(s) Outcome: Progressing   Problem: Education: Goal: Ability to describe self-care measures that may prevent or decrease complications (Diabetes Survival Skills Education) will improve Outcome: Not Progressing Goal: Individualized Educational Video(s) Outcome: Not Applicable   Problem: Coping: Goal: Ability to adjust to condition or change in health will improve Outcome: Progressing   Problem: Fluid Volume: Goal: Ability to maintain a balanced intake and output will improve Outcome: Progressing   Problem: Health Behavior/Discharge Planning: Goal: Ability to identify and utilize available resources and services will improve Outcome: Not Progressing Goal: Ability to manage health-related needs will improve Outcome: Progressing   Problem: Metabolic: Goal: Ability to maintain appropriate glucose levels will improve Outcome: Progressing   Problem: Nutritional: Goal: Maintenance of adequate nutrition will improve Outcome: Progressing Goal: Progress toward achieving an optimal weight will improve Outcome: Progressing   Problem: Skin Integrity: Goal: Risk for impaired skin integrity will decrease Outcome: Progressing   Problem: Tissue Perfusion: Goal: Adequacy of tissue perfusion will improve Outcome: Progressing   Problem: Education: Goal: Knowledge of General Education information will improve Description: Including pain rating scale, medication(s)/side effects and non-pharmacologic comfort measures Outcome: Not Progressing   Problem: Health Behavior/Discharge Planning: Goal: Ability to manage health-related needs will improve Outcome: Not Progressing   Problem: Clinical Measurements: Goal: Ability to maintain clinical measurements within normal limits will improve Outcome: Progressing Goal: Will remain free from infection Outcome: Progressing Goal: Diagnostic test results will improve Outcome: Progressing Goal:  Respiratory complications will improve Outcome: Progressing Goal: Cardiovascular complication will be avoided Outcome: Progressing   Problem: Activity: Goal: Risk for activity intolerance will decrease Outcome: Progressing   Problem: Nutrition: Goal: Adequate nutrition will be maintained Outcome: Progressing   Problem: Coping: Goal: Level of anxiety will decrease Outcome: Progressing   Problem: Elimination: Goal: Will not experience complications related to bowel motility Outcome: Progressing Goal: Will not experience complications related to urinary retention Outcome: Progressing   Problem: Pain Management: Goal: General experience of comfort will improve Outcome: Progressing   Problem: Safety: Goal: Ability to remain free from injury will improve Outcome: Progressing   Problem: Skin Integrity: Goal: Risk for impaired skin integrity will decrease Outcome: Progressing   Problem: Education: Goal: Knowledge of disease or condition will improve Outcome: Not Progressing Goal: Knowledge of secondary prevention will improve (MUST DOCUMENT ALL) Outcome: Not Progressing Goal: Knowledge of patient specific risk factors will improve Alonso N/A or DELETE if not current risk factor) Outcome: Not Progressing   Problem: Ischemic Stroke/TIA Tissue Perfusion: Goal: Complications of ischemic stroke/TIA will be minimized Outcome: Progressing   Problem: Coping: Goal: Will verbalize positive feelings about self Outcome: Not Progressing Goal: Will identify appropriate support needs Outcome: Progressing   Problem: Health Behavior/Discharge Planning: Goal: Ability to manage health-related needs will improve Outcome: Progressing Goal: Goals will be collaboratively established with patient/family Outcome: Progressing   Problem: Self-Care: Goal: Ability to participate in self-care as condition permits will improve Outcome: Not Progressing Goal: Verbalization of feelings and  concerns over difficulty with self-care will improve Outcome: Not Progressing Goal: Ability to communicate needs accurately will improve Outcome: Progressing   Problem: Nutrition: Goal: Risk of aspiration will decrease Outcome: Progressing Goal: Dietary intake will improve Outcome: Progressing   Problem: Activity: Goal: Ability to tolerate increased activity will improve Outcome: Progressing   Problem: Respiratory: Goal: Ability to maintain a clear airway and adequate ventilation will improve Outcome: Progressing   Problem: Role Relationship: Goal: Method of communication will improve Outcome: Not Progressing

## 2023-07-02 NOTE — Progress Notes (Signed)
 STROKE TEAM PROGRESS NOTE   SUBJECTIVE (INTERVAL HISTORY) His RN is at the bedside. Pt remains intubated   but sedation is being weaned.  He   more responsive today and partially opens eyes but will not follow gaze consistently though he has some spontaneous roving eye movements.  He has spontaneous movements in all 4 extremities vital signs stable.  Carotid ultrasound was highly limited due to neck bandages and only external carotid arteries could be studied.  Transcranial Doppler study also had poor windows with suggest mild PCA stenosis and globally elevated pulsatility and dialysis suggest diffuse intracranial atherosclerosis  OBJECTIVE Temp:  [99.1 F (37.3 C)-100.8 F (38.2 C)] 99.9 F (37.7 C) (01/14 1200) Pulse Rate:  [81-132] 88 (01/14 1200) Cardiac Rhythm: Normal sinus rhythm (01/14 0800) Resp:  [17-51] 39 (01/14 1200) BP: (127-210)/(63-128) 149/70 (01/14 1200) SpO2:  [97 %-100 %] 99 % (01/14 1200) FiO2 (%):  [40 %] 40 % (01/14 1128) Weight:  [92.8 kg] 92.8 kg (01/14 0500)  Recent Labs  Lab 07/01/23 1949 07/01/23 2330 07/02/23 0400 07/02/23 0720 07/02/23 1122  GLUCAP 156* 157* 197* 162* 213*   Recent Labs  Lab 06/28/23 0305 06/28/23 0306 06/28/23 1941 06/29/23 0403 06/30/23 0358 07/01/23 0542 07/02/23 0448  NA 133*   < > 133* 135 140 145 146*  K 5.7*   < > 4.5 4.4 4.5 4.1 4.1  CL 104  --  104 105 109 117* 117*  CO2 18*  --  18* 18* 21* 22 20*  GLUCOSE 139*  --  158* 124* 117* 180* 183*  BUN 53*  --  54* 55* 52* 47* 50*  CREATININE 2.62*  --  2.56* 2.41* 2.08* 1.61* 1.37*  CALCIUM  8.1*  --  8.3* 8.4* 9.1 8.6* 9.1  MG 1.5*  --   --  2.3 2.2 2.0 1.7  PHOS 5.3*  --   --  4.5 3.9 2.9 2.4*   < > = values in this interval not displayed.   Recent Labs  Lab 06/27/23 0558  AST 18  ALT 14  ALKPHOS 107  BILITOT 0.6  PROT 7.0  ALBUMIN  3.1*   Recent Labs  Lab 06/27/23 0558 06/27/23 9161 06/28/23 0305 06/28/23 0306 06/28/23 0803 06/29/23 0403 06/30/23 0358  07/01/23 0542 07/02/23 0448  WBC 13.0*  --  27.5*  --   --  10.0 7.3 4.2 5.9  NEUTROABS 11.0*  --   --   --   --   --   --   --   --   HGB 11.7*   < > 10.9*   < > 10.5* 8.7* 8.5* 6.9* 8.6*  HCT 38.1*   < > 35.1*   < > 31.0* 27.4* 26.9* 21.3* 26.6*  MCV 95.0  --  92.6  --   --  91.0 91.2 92.2 89.0  PLT 311  --  280  --   --  147* 135* 100* 135*   < > = values in this interval not displayed.   No results for input(s): CKTOTAL, CKMB, CKMBINDEX, TROPONINI in the last 168 hours. No results for input(s): LABPROT, INR in the last 72 hours.  No results for input(s): COLORURINE, LABSPEC, PHURINE, GLUCOSEU, HGBUR, BILIRUBINUR, KETONESUR, PROTEINUR, UROBILINOGEN, NITRITE, LEUKOCYTESUR in the last 72 hours.  Invalid input(s): APPERANCEUR      Component Value Date/Time   CHOL 107 06/29/2023 0403   TRIG 521 (H) 06/29/2023 0403   HDL <10 (L) 06/29/2023 0403   CHOLHDL NOT CALCULATED 06/29/2023 0403  VLDL UNABLE TO CALCULATE IF TRIGLYCERIDE OVER 400 mg/dL 98/88/7974 9596   LDLCALC UNABLE TO CALCULATE IF TRIGLYCERIDE OVER 400 mg/dL 98/88/7974 9596   Lab Results  Component Value Date   HGBA1C 6.4 (H) 06/27/2023      Component Value Date/Time   LABOPIA NONE DETECTED 06/27/2023 0748   COCAINSCRNUR NONE DETECTED 06/27/2023 0748   LABBENZ NONE DETECTED 06/27/2023 0748   AMPHETMU NONE DETECTED 06/27/2023 0748   THCU NONE DETECTED 06/27/2023 0748   LABBARB NONE DETECTED 06/27/2023 0748    No results for input(s): ETH in the last 168 hours.  I have personally reviewed the radiological images below and agree with the radiology interpretations.  VAS US  TRANSCRANIAL DOPPLER Result Date: 07/02/2023  Transcranial Doppler Patient Name:  Gregory [name]  Date of Exam:   07/01/2023 Medical Rec #: [id number]     Accession #:    [id number] Date of Birth: September 06, 1960    Patient Gender: M Patient Age:   63 years Exam Location:  [facility] Procedure:      VAS US   TRANSCRANIAL DOPPLER Referring Phys: DEVON SHAFER --------------------------------------------------------------------------------  Indications: Stroke. History: PAD, right AKA, HTN, HLD, DM, CHF. Limitations: Ventilation, trach collar, body habitus Comparison Study: No prior study Performing Technologist: Alberta Lis RVS  Examination Guidelines: A complete evaluation includes B-mode imaging, spectral Doppler, color Doppler, and power Doppler as needed of all accessible portions of each vessel. Bilateral testing is considered an integral part of a complete examination. Limited examinations for reoccurring indications may be performed as noted.  +----------+---------------+----------+-----------+----------------------------+ RIGHT TCD Right VM (cm/s)Depth (cm)Pulsatility          Comment            +----------+---------------+----------+-----------+----------------------------+ MCA             24                    1.12     unable to insonate mid and                                                        proximal MCA         +----------+---------------+----------+-----------+----------------------------+ ACA                                                unable to insonate      +----------+---------------+----------+-----------+----------------------------+ Term ICA        34                    1.18                                 +----------+---------------+----------+-----------+----------------------------+ PCA P1          62                    1.05                                 +----------+---------------+----------+-----------+----------------------------+ Opthalmic       32  1.36                                 +----------+---------------+----------+-----------+----------------------------+ ICA siphon      29                    1.41                                  +----------+---------------+----------+-----------+----------------------------+ Vertebral                                          unable to insonate      +----------+---------------+----------+-----------+----------------------------+  +----------+--------------+----------+-----------+------------------+ LEFT TCD  Left VM (cm/s)Depth (cm)Pulsatility     Comment       +----------+--------------+----------+-----------+------------------+ MCA             37                   1.23                       +----------+--------------+----------+-----------+------------------+ ACA            -15                   1.36                       +----------+--------------+----------+-----------+------------------+ Term ICA        37                   0.97                       +----------+--------------+----------+-----------+------------------+ PCA P1         -37                   1.43                       +----------+--------------+----------+-----------+------------------+ Opthalmic       35                   1.45                       +----------+--------------+----------+-----------+------------------+ ICA siphon      33                   1.72                       +----------+--------------+----------+-----------+------------------+ Vertebral                                    unable to insonate +----------+--------------+----------+-----------+------------------+  +------------+-------+-------+             VM cm/sComment +------------+-------+-------+ Prox Basilar  -19          +------------+-------+-------+ Summary:  Poor windows throughout limit evaluation. Low normal mean flow velocities in majority of identified vessels of anterior and posterior cerebral circulations..Elevated right posterior cerebral artery mean flow velocities suggest mild stenosis.Globally elevate dpulsatility indices suggest diffuse intracranial atherosclerosis likely. *See table(s)  above  for TCD measurements and observations.  Diagnosing physician: Eather Popp MD Electronically signed by Eather Popp MD on 07/02/2023 at 11:46:03 AM.    Final    VAS US  CAROTID (at [facility] and [facility] only) Result Date: 07/02/2023 Carotid Arterial Duplex Study Patient Name:  Gregory [name]  Date of Exam:   07/01/2023 Medical Rec #: [id number]     Accession #:    [id number] Date of Birth: 19-Dec-1960    Patient Gender: M Patient Age:   63 years Exam Location:  [facility] Procedure:      VAS US  CAROTID Referring Phys: ELIGIO LAV --------------------------------------------------------------------------------  Indications:       CVA. Risk Factors:      Hypertension, hyperlipidemia, Diabetes, current smoker, prior                    CVA, PAD. Other Factors:     Right AKA. Limitations        Today's exam was limited due to the neck habitus of the                    patient, bandages, line and patient on a ventilator. Comparison Study:  Prior carotid duplex done 09/19/22 indicating bilateral 1-39%                    ICA stenosis Performing Technologist: Alberta Lis RVS  Examination Guidelines: A complete evaluation includes B-mode imaging, spectral Doppler, color Doppler, and power Doppler as needed of all accessible portions of each vessel. Bilateral testing is considered an integral part of a complete examination. Limited examinations for reoccurring indications may be performed as noted.  Right Carotid Findings: +--------+--------+--------+--------+------------------+--------+         PSV cm/sEDV cm/sStenosisPlaque DescriptionComments +--------+--------+--------+--------+------------------+--------+ ICA Prox                                          tortuous +--------+--------+--------+--------+------------------+--------+ ECA     182     21                                         +--------+--------+--------+--------+------------------+--------+  Left Carotid Findings:  +--------+--------+--------+--------+------------------+--------+         PSV cm/sEDV cm/sStenosisPlaque DescriptionComments +--------+--------+--------+--------+------------------+--------+ CCA [id number]      27                                         +--------+--------+--------+--------+------------------+--------+   Summary: Right Carotid: Significantly limited study. Left Carotid: Significantly limited study.  *See table(s) above for measurements and observations.  Electronically signed by Eather Popp MD on 07/02/2023 at 11:43:28 AM.    Final    DG FL GUIDED LUMBAR PUNCTURE Result Date: 07/01/2023 CLINICAL DATA:  63 year old male with altered mental status. Lumbar puncture requested. EXAM: LUMBAR PUNCTURE UNDER FLUOROSCOPY PROCEDURE: An appropriate skin entry site was determined fluoroscopically. Operator donned sterile gloves and mask. Skin site was marked, then prepped with Betadine , draped in usual sterile fashion, and infiltrated locally with 1% lidocaine . A 20 gauge spinal needle advancement was attempted at L3-L4, L4-L5, and L5-S1 by this provider as well as additional attempts at L2-L3 and L3-L4 by Dr. Marthann without  return of CSF. After multiple unsuccessful attempts the needle was removed. The patient tolerated the procedure well and there were no complications. FLUOROSCOPY: Radiation Exposure Index (as provided by the fluoroscopic device): 105.3 mGy Kerma IMPRESSION: Unsuccessful lumbar puncture under fluoroscopy. This exam was performed by Solmon Ku [name], and was supervised and interpreted by Rockey Kilts, MD. Electronically Signed   By: Rockey Kilts M.D.   On: 07/01/2023 08:14   DG Abd 1 View Result Date: 06/30/2023 CLINICAL DATA:  Ileus. EXAM: ABDOMEN - 1 VIEW COMPARISON:  06/27/2023 FINDINGS: The marked gaseous dilatation of the stomach seen on the previous study has resolved in the interval. No gaseous small bowel or colonic dilatation on the current study. NG tube tip is in  the stomach with proximal side port below the GE junction. Rectal temperature probe overlies the low pelvis. IMPRESSION: Interval resolution of gaseous dilatation of the stomach. No gaseous small bowel or colonic dilatation on the current study. Electronically Signed   By: Camellia Candle M.D.   On: 06/30/2023 06:34   DG CHEST PORT 1 VIEW Result Date: 06/30/2023 CLINICAL DATA:  63 year old male with respiratory failure, ventilator dependent. EXAM: PORTABLE CHEST 1 VIEW COMPARISON:  Portable chest 06/28/2023 and earlier. FINDINGS: Portable AP semi upright view at 0421 hours. Endotracheal tube tip in good position between the clavicles and carina. Stable right IJ central line. Enteric tube loops in the stomach. Lung volumes and mediastinal contours are stable and within normal limits. Mildly rotated to the left. Partially visible cervical spine fusion hardware. Regressed bilateral lower lung consolidation, perihilar opacity since 06/27/2023. No areas of worsening ventilation since that time. No pneumothorax or pleural effusion. No pulmonary edema. Paucity of bowel gas.  Stable visualized osseous structures. IMPRESSION: 1.  Stable lines and tubes. 2. Regressed bilateral consolidation/pneumonia since 06/27/2023. Bilateral improved ventilation. No new cardiopulmonary abnormality. Electronically Signed   By: VEAR Hurst M.D.   On: 06/30/2023 05:42   ECHOCARDIOGRAM COMPLETE Result Date: 06/28/2023    ECHOCARDIOGRAM REPORT   Patient Name:   Gregory [name] Date of Exam: 06/28/2023 Medical Rec #:  [id number]    Height:       72.0 in Accession #:    [id number]   Weight:       212.5 lb Date of Birth:  1960-09-01   BSA:          2.186 m Patient Age:    62 years     BP:           101/58 mmHg Patient Gender: M            HR:           90 bpm. Exam Location:  Inpatient Procedure: 2D Echo, Cardiac Doppler and Color Doppler Indications:    Stroke I63.9  History:        Patient has prior history of Echocardiogram examinations, most                  recent 12/06/2022. CHF, COPD, Stroke and CKD, stage 3,                 Signs/Symptoms:Shortness of Breath; Risk Factors:Hypertension,                 Diabetes, Current Smoker and Dyslipidemia.  Sonographer:    Thea Norlander RCS Referring Phys: [id number] ASHISH ARORA IMPRESSIONS  1. Left ventricular ejection fraction, by estimation, is 65 to 70%. The left ventricle has hyperdynamic function. The left ventricle has  no regional wall motion abnormalities. Left ventricular diastolic parameters were normal.  2. Right ventricular systolic function is normal. The right ventricular size is mildly enlarged. Tricuspid regurgitation signal is inadequate for assessing PA pressure.  3. The mitral valve is normal in structure. No evidence of mitral valve regurgitation. No evidence of mitral stenosis.  4. The aortic valve is normal in structure. There is mild calcification of the aortic valve. There is mild thickening of the aortic valve. Aortic valve regurgitation is mild. Aortic valve sclerosis/calcification is present, without any evidence of aortic stenosis.  5. The inferior vena cava is normal in size with <50% respiratory variability, suggesting right atrial pressure of 8 mmHg. FINDINGS  Left Ventricle: Left ventricular ejection fraction, by estimation, is 65 to 70%. The left ventricle has hyperdynamic function. The left ventricle has no regional wall motion abnormalities. The left ventricular internal cavity size was small. There is no  left ventricular hypertrophy. Left ventricular diastolic parameters were normal. Right Ventricle: The right ventricular size is mildly enlarged. No increase in right ventricular wall thickness. Right ventricular systolic function is normal. Tricuspid regurgitation signal is inadequate for assessing PA pressure. Left Atrium: Left atrial size was normal in size. Right Atrium: Right atrial size was normal in size. Pericardium: There is no evidence of pericardial effusion. Mitral Valve:  The mitral valve is normal in structure. No evidence of mitral valve regurgitation. No evidence of mitral valve stenosis. Tricuspid Valve: The tricuspid valve is normal in structure. Tricuspid valve regurgitation is trivial. No evidence of tricuspid stenosis. Aortic Valve: The aortic valve is normal in structure. There is mild calcification of the aortic valve. There is mild thickening of the aortic valve. Aortic valve regurgitation is mild. Aortic valve sclerosis/calcification is present, without any evidence of aortic stenosis. Aortic valve mean gradient measures 10.0 mmHg. Aortic valve peak gradient measures 20.2 mmHg. Aortic valve area, by VTI measures 2.25 cm. Pulmonic Valve: The pulmonic valve was normal in structure. Pulmonic valve regurgitation is not visualized. No evidence of pulmonic stenosis. Aorta: The aortic root and ascending aorta are structurally normal, with no evidence of dilitation. Venous: The inferior vena cava is normal in size with less than 50% respiratory variability, suggesting right atrial pressure of 8 mmHg. IAS/Shunts: No atrial level shunt detected by color flow Doppler.  LEFT VENTRICLE PLAX 2D LVIDd:         5.00 cm   Diastology LVIDs:         3.60 cm   LV e' medial:    7.72 cm/s LV PW:         1.00 cm   LV E/e' medial:  9.5 LV IVS:        0.80 cm   LV e' lateral:   12.80 cm/s LVOT diam:     2.40 cm   LV E/e' lateral: 5.7 LV SV:         74 LV SV Index:   34 LVOT Area:     4.52 cm  RIGHT VENTRICLE             IVC RV S prime:     14.80 cm/s  IVC diam: 2.80 cm TAPSE (M-mode): 2.9 cm LEFT ATRIUM             Index        RIGHT ATRIUM           Index LA diam:        3.60 cm 1.65 cm/m   RA Area:  15.90 cm LA Vol (A2C):   28.8 ml 13.17 ml/m  RA Volume:   41.60 ml  19.03 ml/m LA Vol (A4C):   20.3 ml 9.29 ml/m LA Biplane Vol: 24.3 ml 11.11 ml/m  AORTIC VALVE AV Area (Vmax):    2.08 cm AV Area (Vmean):   2.16 cm AV Area (VTI):     2.25 cm AV Vmax:           224.50 cm/s AV Vmean:           144.500 cm/s AV VTI:            0.329 m AV Peak Grad:      20.2 mmHg AV Mean Grad:      10.0 mmHg LVOT Vmax:         103.33 cm/s LVOT Vmean:        69.033 cm/s LVOT VTI:          0.163 m LVOT/AV VTI ratio: 0.50  AORTA Ao Root diam: 3.40 cm Ao Asc diam:  3.40 cm MITRAL VALVE               TRICUSPID VALVE MV Area (PHT): 4.21 cm    TR Peak grad:   11.6 mmHg MV Decel Time: 180 msec    TR Vmax:        170.00 cm/s MV E velocity: 73.50 cm/s MV A velocity: 72.70 cm/s  SHUNTS MV E/A ratio:  1.01        Systemic VTI:  0.16 m                            Systemic Diam: 2.40 cm Morene Brownie Electronically signed by Morene Brownie Signature Date/Time: 06/28/2023/2:33:49 PM    Final    Overnight EEG with video Result Date: 06/28/2023 Gregory Arlin KIDD, MD     06/29/2023  9:12 AM Patient Name: Gregory [name] MRN: [id number] Epilepsy Attending: Arlin [name] Gregory Referring Physician/Provider: Judithe Rocky BROCKS, NP Duration: 06/27/2023 2356 to 06/29/2023 0200 Patient history: 63yo M with ams getting eeg to evaluate for seizure Level of alertness: comatose AEDs during EEG study: Propofol  Technical aspects: This EEG study was done with scalp electrodes positioned according to the 10-20 International system of electrode placement. Electrical activity was reviewed with band pass filter of 1-70Hz , sensitivity of 7 uV/mm, display speed of 41mm/sec with a 60Hz  notched filter applied as appropriate. EEG data were recorded continuously and digitally stored.  Video monitoring was available and reviewed as appropriate. Description: EEG showed continuous generalized 3 to 6 Hz theta-delta slowing. Hyperventilation and photic stimulation were not performed.   EEG was disconnected between 06/28/2023 1436 to 1639 ABNORMALITY - Continuous slow, generalized IMPRESSION: This study is suggestive of severe diffuse encephalopathy. No seizures or epileptiform discharges were seen throughout the recording. Arlin [name] Gregory   DG Chest Port 1 View Result  Date: 06/28/2023 CLINICAL DATA:  [id number]. Encounter for pneumonia. Ventilator dependent respiratory failure. EXAM: PORTABLE CHEST 1 VIEW COMPARISON:  Chest CT without contrast yesterday at 7:51 p.m. FINDINGS: 3:56 a.m. ETT tip is 5 cm from the carina, NGT curves around to the left in the stomach with the tip in the upper medial lumen. Right IJ central line tip at the superior cavoatrial junction. Widespread right-greater-than-left airspace disease is again noted, with relative sparing in the left upper lung field. No pleural effusion is seen. There is mild cardiomegaly, mild central vascular prominence. Stable mediastinum. There is  aortic atherosclerosis.  No acute or new osseous findings. IMPRESSION: 1. Widespread right-greater-than-left airspace disease, with relative sparing in the left upper lung field. No new or worsening infiltrate. 2. Mild cardiomegaly with mild central vascular prominence. 3. Support apparatus as above. 4. Aortic atherosclerosis. Electronically Signed   By: Francis Quam M.D.   On: 06/28/2023 05:58   MR BRAIN WO CONTRAST Result Date: 06/27/2023 CLINICAL DATA:  Initial evaluation for acute neuro deficit, stroke suspected. EXAM: MRI HEAD WITHOUT CONTRAST TECHNIQUE: Multiplanar, multiecho pulse sequences of the brain and surrounding structures were obtained without intravenous contrast. COMPARISON:  Prior study from earlier the same day. FINDINGS: Brain: Age-related cerebral atrophy. Patchy and confluent T2/FLAIR hyperintensity involving the periventricular deep white matter both cerebral hemispheres, consistent with chronic small vessel ischemic disease, moderately advanced. Patchy involvement of the pons. Encephalomalacia and gliosis involving the right occipital lobe, consistent with a chronic left PCA distribution infarct. Associated chronic hemosiderin staining within this region. Superimposed restricted diffusion within the right temporal occipital region, consistent with an acute on  chronic right PCA distribution infarct. Patchy involvement of the right thalamus. No associated hemorrhage or significant regional mass effect. No other evidence for acute or subacute ischemia. No acute intracranial hemorrhage. Chronic microhemorrhage at the anterior right temporal lobe noted. No mass lesion, midline shift or mass effect. No hydrocephalus or extra-axial fluid collection. Pituitary gland suprasellar region within normal limits. Vascular: Major intracranial vascular flow voids are maintained. Skull and upper cervical spine: Cranial junctional is. Postoperative changes partially visualize within the cervical spine. Bone marrow signal intensity normal. No scalp soft tissue abnormality. Sinuses/Orbits: Globes orbital soft tissues within normal limits. Mucosal thickening noted about the sphenoid ethmoidal and maxillary sinuses. Superimposed air-fluid level within the right maxillary sinus. Small bilateral mastoid effusions. Patient is intubated. Other: None. IMPRESSION: 1. 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 No associated hemorrhage or significant regional mass effect. 2. Underlying age-related cerebral atrophy with moderate chronic microvascular ischemic disease. Electronically Signed   By: Morene Hoard M.D.   On: 06/27/2023 21:28   CT CHEST ABDOMEN PELVIS WO CONTRAST Result Date: 06/27/2023 CLINICAL DATA:  Pneumonia, sepsis EXAM: CT CHEST, ABDOMEN AND PELVIS WITHOUT CONTRAST TECHNIQUE: Multidetector CT imaging of the chest, abdomen and pelvis was performed following the standard protocol without IV contrast. RADIATION DOSE REDUCTION: This exam was performed according to the departmental dose-optimization program which includes automated exposure control, adjustment of the mA and/or kV according to patient size and/or use of iterative reconstruction technique. COMPARISON:  CT abdomen and pelvis 02/08/2023. FINDINGS: CT CHEST FINDINGS  Cardiovascular: Heart is normal size. Aorta is normal caliber. Moderate coronary artery and aortic calcifications. Mediastinum/Nodes: No mediastinal, hilar, or axillary adenopathy. Trachea and esophagus are unremarkable. Endotracheal tube tip in the midtrachea. Thyroid unremarkable. Lungs/Pleura: Dense consolidation in both lower lobes compatible with pneumonia. No effusions. Patchy ground-glass opacities in the upper lobes as well. Musculoskeletal: Chest wall soft tissues are unremarkable. No acute bony abnormality. CT ABDOMEN PELVIS FINDINGS Hepatobiliary: Small layering gallstones within the gallbladder. No focal hepatic abnormality or biliary ductal dilatation. Pancreas: No focal abnormality or ductal dilatation. Spleen: No focal abnormality.  Normal size. Adrenals/Urinary Tract: Foley catheter in the bladder which is decompressed. No renal or adrenal mass. No stones or hydronephrosis. Stomach/Bowel: Prior colectomy. Right lower quadrant ostomy noted. No bowel obstruction. NG tube is in the stomach. Vascular/Lymphatic: Aortic atherosclerosis. No evidence of aneurysm or adenopathy. Reproductive: Mildly prominent prostate. Other: No free fluid  or free air. Musculoskeletal: No acute bony abnormality. IMPRESSION: Dense consolidation in both lower lobes with patchy opacities in the upper lobes. Findings compatible with multifocal pneumonia. Coronary artery disease, aortic atherosclerosis. Right lower quadrant ostomy.  No evidence of bowel obstruction. Cholelithiasis.  No CT evidence for acute cholecystitis. Electronically Signed   By: Franky Crease M.D.   On: 06/27/2023 20:01   DG CHEST PORT 1 VIEW Result Date: 06/27/2023 CLINICAL DATA:  Central line placement. EXAM: PORTABLE CHEST 1 VIEW COMPARISON:  Same day. FINDINGS: Endotracheal and nasogastric tubes are unchanged. Interval placement of right internal jugular catheter with distal tip in expected position of the SVC. No pneumothorax is noted. Stable diffuse  right lung interstitial opacity is noted concerning for asymmetric edema or atypical inflammation. IMPRESSION: Interval placement of right internal jugular catheter with distal tip in expected position of the SVC. Electronically Signed   By: Lynwood Landy Raddle M.D.   On: 06/27/2023 12:40   CT HEAD WO CONTRAST ( ) Result Date: 06/27/2023 CLINICAL DATA:  Altered mental status. EXAM: CT HEAD WITHOUT CONTRAST TECHNIQUE: Contiguous axial images were obtained from the base of the skull through the vertex without intravenous contrast. RADIATION DOSE REDUCTION: This exam was performed according to the departmental dose-optimization program which includes automated exposure control, adjustment of the mA and/or kV according to patient size and/or use of iterative reconstruction technique. COMPARISON:  CT head dated February 08, 2023. FINDINGS: Brain: New loss of the normal gray-white matter differentiation along the inferior right temporal lobe (series 3, image 14; series 5, images 35-42). No evidence of hemorrhage, hydrocephalus, or extra-axial collection. Old right occipital lobe infarct again noted. Vascular: Calcified atherosclerosis at the skull base. No hyperdense vessel. Skull: Normal. Negative for fracture or focal lesion. Sinuses/Orbits: No acute finding. Other: Similar postsurgical changes right pre maxillary soft tissues. IMPRESSION: 1. New loss of the normal gray-white matter differentiation along the inferior right temporal lobe, concerning for acute infarct. No hemorrhage. Consider further evaluation with MRI. 2. Old right occipital lobe infarct. Electronically Signed   By: Elsie ONEIDA Shoulder M.D.   On: 06/27/2023 11:00   DG Chest Port 1 View Result Date: 06/27/2023 CLINICAL DATA:  Ventilator dependent respiratory failure. [id number] encounter for orogastric tube placement. EXAM: PORTABLE ABDOMEN - 1 VIEW; PORTABLE CHEST - 1 VIEW COMPARISON:  Portable chest today at 6:10 a.m., abdomen and pelvis CT 02/08/2023  FINDINGS: Chest AP portable 6:48 a.m.: Interval intubation with tip of the ETT 4.6 cm from carina. NGT interval insertion. The heart is enlarged. There is worsening central vascular congestion and perihilar edema. There is increased right-greater-than-left perihilar interstitial Dyane most likely due to ground-glass edema. The more peripheral lungs are essentially clear. Small pleural effusions are forming. The mediastinum is stable.  No new osseous abnormality. Flat plate single anteverted view, portable at 6:51 a.m.: NGT curves to the left and cephalad within the stomach with the tip along the proximal fundus. Compared with CT 02/08/2023 there is severe dilatation of the stomach with air and food products. Correlate clinically for gastric outlet obstruction or impaired gastric emptying. The bowel pattern appears nonobstructive. There is no supine evidence of free air. No other significant radiographic findings. IMPRESSION: 1. Interval intubation with tip of the ETT 4.6 cm from carina. 2. NGT curves to the left and cephalad within the stomach with the tip along the proximal fundus. 3. Compared with CT 02/08/2023 there is severe dilatation of the stomach with air and food products. Correlate clinically for gastric outlet  obstruction or impaired gastric emptying. 4. Worsening central vascular congestion and perihilar edema. 5. Developing small pleural effusions. Electronically Signed   By: Francis Quam M.D.   On: 06/27/2023 07:27   DG Abd Portable 1V Result Date: 06/27/2023 CLINICAL DATA:  Ventilator dependent respiratory failure. [id number] encounter for orogastric tube placement. EXAM: PORTABLE ABDOMEN - 1 VIEW; PORTABLE CHEST - 1 VIEW COMPARISON:  Portable chest today at 6:10 a.m., abdomen and pelvis CT 02/08/2023 FINDINGS: Chest AP portable 6:48 a.m.: Interval intubation with tip of the ETT 4.6 cm from carina. NGT interval insertion. The heart is enlarged. There is worsening central vascular congestion and  perihilar edema. There is increased right-greater-than-left perihilar interstitial Dyane most likely due to ground-glass edema. The more peripheral lungs are essentially clear. Small pleural effusions are forming. The mediastinum is stable.  No new osseous abnormality. Flat plate single anteverted view, portable at 6:51 a.m.: NGT curves to the left and cephalad within the stomach with the tip along the proximal fundus. Compared with CT 02/08/2023 there is severe dilatation of the stomach with air and food products. Correlate clinically for gastric outlet obstruction or impaired gastric emptying. The bowel pattern appears nonobstructive. There is no supine evidence of free air. No other significant radiographic findings. IMPRESSION: 1. Interval intubation with tip of the ETT 4.6 cm from carina. 2. NGT curves to the left and cephalad within the stomach with the tip along the proximal fundus. 3. Compared with CT 02/08/2023 there is severe dilatation of the stomach with air and food products. Correlate clinically for gastric outlet obstruction or impaired gastric emptying. 4. Worsening central vascular congestion and perihilar edema. 5. Developing small pleural effusions. Electronically Signed   By: Francis Quam M.D.   On: 06/27/2023 07:27   DG Chest Port 1 View Result Date: 06/27/2023 CLINICAL DATA:  Unresponsive. EXAM: PORTABLE CHEST 1 VIEW COMPARISON:  02/08/2023 FINDINGS: The cardio pericardial silhouette is enlarged. Vascular congestion diffuse noted with diffuse interstitial opacity suggesting edema. No focal consolidation. No pneumothorax or pleural effusion. Gaseous distention of stomach or bowel is seen in the the left hemidiaphragm. Telemetry leads overlie the chest. IMPRESSION: Enlargement of the cardiopericardial silhouette with vascular congestion and diffuse interstitial opacity suggesting edema. Gaseous distention of stomach or bowel noted under the left hemidiaphragm. Electronically Signed   By: Camellia Candle M.D.   On: 06/27/2023 06:32     PHYSICAL EXAM  Temp:  [99.1 F (37.3 C)-100.8 F (38.2 C)] 99.9 F (37.7 C) (01/14 1200) Pulse Rate:  [81-132] 88 (01/14 1200) Resp:  [17-51] 39 (01/14 1200) BP: (127-210)/(63-128) 149/70 (01/14 1200) SpO2:  [97 %-100 %] 99 % (01/14 1200) FiO2 (%):  [40 %] 40 % (01/14 1128) Weight:  [92.8 kg] 92.8 kg (01/14 0500)  General - Well nourished, well developed, intubated on sedation.  Ophthalmologic - fundi not visualized due to noncooperation.  Cardiovascular - Regular rate and tachycardia.  Neuro - intubated sedated  ., eyes closed, opens eyes to sternal rub briefly.  Not following commands.  Has spontaneous side-to-side eye movements., not blinking to visual threat, doll's eyes absent, not tracking, PERRL. Corneal reflex positive on the left but absent on the right, gag and cough present. Breathing over the vent.  Facial symmetry not able to test due to ET tube.   On pain stimulation able to move all 4 limbs purposefully upper extremities more than lower extremities.  Sensation, coordination and gait not tested.   ASSESSMENT/PLAN Mr. VENSON FERENCZ is a 63  y.o. male with history of DM, HTN, HLD, left eye blind, right occipital infarct with left HH, mesenteric ischemia s/p colostomy, RLE AKA, wheelchair bound living in SNF admitted for AMS, obtundation. He was intubated for nausea vomiting and not able to protect airway. Found to have high grade fever and in septic shock. No TNK given due to time onset unclear.    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 Old right occipital lobe infarct.  MRI  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 involving the right temporal occipital region. Transcranial Doppler for history of mild  right PCA stenosis and elevated pulsatility and IC suggest diffuse intracranial atherosclerosis Carotid Doppler highly limited and only both ECAs could be started 2D Echo   ejection fraction 65 to 70% with hyperdynamic LV function.  Left atrium size normal LDL unable to calculate due to elevated triglycerides 521  HgbA1c 6.4 UDS neg Heparin  subq for VTE prophylaxis aspirin  81 mg daily prior to admission, now on aspirin  81 mg daily.  Ongoing aggressive stroke risk factor management Therapy recommendations:  pending Disposition:   as per  daughter, full code for now, palliative care on board  Sepsis/septic shock B/l [place]  UTI CT chest Dense consolidation in both lower lobes with patchy opacities in the upper lobes. Findings compatible with multifocal pneumonia. UA WBC > 50 On zosyn , vanco and azithromycin   Need to rule out meningitis/encephalitis LP failed at the bedside and with IR fluoroscopy as well.  On pressor support CCM on board  Respiratory failure Intubated on vent On sedation and pressor support Vent management per CCM  Hx of stroke 09/2022 subacute right occipital infarct due to right PCA occlusion with severe vasculopathy. MRA occlusion of the distal right P1/proximal right P2 segment, without evidence of reconstitution of the right PCA branches. No DVT, EF 55-60%, A1C 6.9 and THC positive in urine. Put on ASA 81 but no plavix due to GIB and chronic anemia. Also on statin and tricor   Follow[facility] at [facility] 11/[facility]22  Diabetes HgbA1c 6.4 goal < 7.0 Controlled CBG monitoring SSI DM education and close PCP follow up  Hx of hypertension hypotension Low BP on presentation consistent with septic shock On pressure now Stable with pressure Stroke likely due to hypotension in the setting of right PCA occlusion Long term BP goal normotensive  Hyperlipidemia Home meds:  lipitor  80  LDL 29, goal < 70 Now on lipitor  80 Continue statin at discharge  Tobacco  abuse Current smoker Smoking cessation counseling will be provided  Other Stroke Risk Factors PVD with RLE AKA Hx of substance abuse - THC  Other Active Problems Left eye blind since 2013 Mesenteric ischemia s/p colostomy 12/2022 with septic shock Full code for now  Hospital day # 5 Patient is now more responsive to stimulation and continue weaning of sedation as tolerated to get a better neurological exam.   Continue aspirin  alone due to GI bleed with known to dual antiplatelet therapy.  No family available at the bedside.  No family at the bedside.  Family still wants to pursue full aggressive care.  Discussed with Dr. Annella critical care medicine.     This patient is critically ill and at significant risk of neurological worsening, death and care requires constant monitoring of vital signs, hemodynamics,respiratory and  cardiac monitoring, extensive review of multiple databases, frequent neurological assessment, discussion with family, other specialists and medical decision making of high complexity.I have made any additions or clarifications directly to the above note.This critical care time does not reflect procedure time, or teaching time or supervisory time of PA/NP/Med Resident etc but could involve care discussion time.  I spent 30 minutes of neurocritical care time  in the care of  this patient.           Eather Popp, MD Stroke Neurology 07/02/2023 12:07 PM    To contact Stroke Continuity provider, please refer to [email or web address]. After hours, contact General Neurology

## 2023-07-02 NOTE — Progress Notes (Signed)
   Palliative Medicine Inpatient Follow Up Note   A review of patient case was completed with Dr. Annella this morning. Overnight patient required an increase in sedation medications. Plan to continue to try to wean them throughout the course of the day in order to get a comprehensive neurological exam.   The PMT will remain available as needed.   Team member, Julia McIIquham plans to meet with patients daughter again on Thursday.  No Charge ______________________________________________________________________________________ Gregory [name] [facility] Palliative Medicine Team Team Cell Phone: [phone] Please utilize secure chat with additional questions, if there is no response within 30 minutes please call the above phone number  Palliative Medicine Team providers are available by phone from 7am to 7pm daily and can be reached through the team cell phone.  Should this patient require assistance outside of these hours, please call the patient's attending physician.

## 2023-07-02 NOTE — Progress Notes (Signed)
 OT Cancellation Note and Discharge  Patient Details Name: Gregory [name] MRN: [id number] DOB: Nov 06, 1960   Cancelled Treatment:    Reason Eval/Treat Not Completed: Patient's level of consciousness.  Spoke with PT who spoke with RN to see if pt appropriate for comprehensive PT/OT evaluation. Reports pt only responding to painful stimuli, has been restless/agitated. Recommended hold PT/OT at this time.   Per dept guidelines after 3rd attempt we will sign-off from our service and wait for patient to become more alert to participate with assessment. Please re-order when patient appears to be able to tolerate PT/OT functional evaluation.  Donny BECKER OT Acute Rehabilitation Services Office [phone]    Rodgers Dorothyann Distel 07/02/2023, 9:33 AM

## 2023-07-02 NOTE — Progress Notes (Signed)
 SLP Cancellation Note and Discharge  Patient Details Name: Gregory [name] MRN: [id number] DOB: Dec 08, 1960   Cancelled treatment:       Reason Eval/Treat Not Completed: Patient not medically ready- pt remains intubated.  SLP will sign off and await new orders if medically appropriate s/p extubation.    Starsha Morning L. Vona, MA CCC/SLP Clinical Specialist - Acute Care SLP Acute Rehabilitation Services Office number [phone]  Vona Palma Laurice 07/02/2023, 10:52 AM

## 2023-07-02 NOTE — Progress Notes (Addendum)
 NAME:  Gregory [name], MRN:  [id number], DOB:  May 14, 1961, LOS: 5 ADMISSION DATE:  06/27/2023, CONSULTATION DATE:  06/27/23 REFERRING MD:  EDP, CHIEF COMPLAINT:  AMS   History of Present Illness:  63 year old male with significant past medical history including diabetes, hypertension, hyperlipidemia, CKDIIIb, COPD, chronic osteomyelitis s/p R AKA, L midfoot amputation, atrial fibrillation, HFpEF, mesenteric ischemia s/p colostomy 12/2022 who presented to ED from SNF for altered mental status/obtunded. Reported increased liquid stool from ostomy. He was subsequently intubated in the ED. Labs in ED notable for K 5.8, Cr 2.6, WBC 13, trop 83>705, BNP 63, UA large leuk, wbc21-50 few bacteria, UDS negative. Admitted to PCCM.   Pertinent  Medical History  COPD, CKD3b, mesenteric ischemia s/p colostomy 12/2022, T2DM, HFpEF, HTN, HLD   Significant Hospital Events: Including procedures, antibiotic start and stop dates in addition to other pertinent events   1/9: admit from ED for AMS, intubated, CVC 1/10: MRI w/ 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 Stroke team/neuro consulted. Recommend stroke work up, d/c meningitis/encephalitis coverage, palliative consult.  1/10 unsuccessful attempts at lumbar puncture 1/12 significant ventilator dyssynchrony, given 1 dose of Nimbex, on Versed  drip 1/13 agitated overnight despite fentanyl  and versed  pushes and Haldol   Interim History / Subjective:  Remains sedated. Overnight became agitated and dyssynchronous with vent, required PRN meds.   Objective   Blood pressure (!) 140/65, pulse 86, temperature 100.2 F (37.9 C), resp. rate (!) 34, height 6' 0.01 (1.829 m), weight 92.8 kg, SpO2 99%.    Vent Mode: PRVC FiO2 (%):  [40 %] 40 % Set Rate:  [28 bmp] 28 bmp Vt Set:  [600 mL] 600 mL PEEP:  [5 cmH20] 5 cmH20 Plateau Pressure:  [18 cmH20-20 cmH20] 20 cmH20   Intake/Output Summary (Last 24 hours) at 07/02/2023 9380 Last data filed at  07/02/2023 0500 Gross per 24 hour  Intake 1723.91 ml  Output 2980 ml  Net -1256.09 ml   Filed Weights   06/30/23 0105 07/01/23 0450 07/02/23 0500  Weight: 96.5 kg 91.2 kg 92.8 kg    Examination: General: chronically ill appearing, sedated on vent HENT: OG tube, ET tube in place Lungs: coarse breath sounds bilaterally Cardiovascular: regular rate, rhythm Abdomen: colostomy present w stool output, soft abdomen Extremities: R AKA, L midfoot amputation; left leg with edema and scabs Neuro: sedated  GU: foley in place  Resolved Hospital Problem list     Assessment & Plan:  Septic shock 2/2 ESBL UTI and multifocal pneumonia Rhinovirus positive - off pressors - continue cefepime ; appreciate ID consultation - trach aspirate growing klebsiella and serratia   Acute hypoxemic respiratory failure 2/2 multifocal pneumonia COPD Vent settings: PRVC, RR 28, TV 600, PEEP 5, FiO2 40%  - cefepime  as above - wean versed , fentanyl  for sedation  - SBT if mentation improves  - VAP protocol  - Brovana , Pulmicort , Yupelri   Encephalopathy - LP unsuccessful - Weaning sedation to see if mental status improves  - Start seroquel  100 mg at bedtime, klonopin  1 mg TID, and lyrica  125 mg TID   Chronic pain - On oxycontin  15 mg BID and oxycodone  5 mg q6h prn at home - increase oxycodone  to 10 mg q6h prn while weaning sedation   AKI on CKD IIIb - Cr improved from 1.6 to 1.37 - maintain renal perfusion - avoid nephrotoxic meds - replete electrolytes prn  Normocytic anemia - Hb improved to 8.6 after 1u prbc on 1/13 - [place] Hb >7  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  - stroke work up per neuro   Diarrhea  - add fiber supplementation   HFrEF - diuretics held  HTN HLD - restarted home coreg  - IV hydralazine  10 mg q4h PRN for SBP>180 - IV labetalol  10 mg q2h PRN for SBP>180  T2DM - CBGs + SSI  Thrombcoytopenia - low risk for HIT, suspect secondary to sepsis - Plt  135, monitor for now  Best Practice (right click and Reselect all SmartList Selections daily)   Diet/type: tubefeeds DVT prophylaxis prophylactic heparin   Pressure ulcer(s): present on admission  GI prophylaxis: H2B Lines: Central line and yes and it is still needed Foley:  Yes, and it is still needed Code Status:  full code Last date of multidisciplinary goals of care discussion merlin Pagan, updated at bedside 1/13]  Labs   CBC: Recent Labs  Lab 06/27/23 0558 06/27/23 9161 06/28/23 0305 06/28/23 0306 06/28/23 0803 06/29/23 0403 06/30/23 0358 07/01/23 0542 07/02/23 0448  WBC 13.0*  --  27.5*  --   --  10.0 7.3 4.2 5.9  NEUTROABS 11.0*  --   --   --   --   --   --   --   --   HGB 11.7*   < > 10.9*   < > 10.5* 8.7* 8.5* 6.9* 8.6*  HCT 38.1*   < > 35.1*   < > 31.0* 27.4* 26.9* 21.3* 26.6*  MCV 95.0  --  92.6  --   --  91.0 91.2 92.2 89.0  PLT 311  --  280  --   --  147* 135* 100* 135*   < > = values in this interval not displayed.    Basic Metabolic Panel: Recent Labs  Lab 06/28/23 0305 06/28/23 0306 06/28/23 1941 06/29/23 0403 06/30/23 0358 07/01/23 0542 07/02/23 0448  NA 133*   < > 133* 135 140 145 146*  K 5.7*   < > 4.5 4.4 4.5 4.1 4.1  CL 104  --  104 105 109 117* 117*  CO2 18*  --  18* 18* 21* 22 20*  GLUCOSE 139*  --  158* 124* 117* 180* 183*  BUN 53*  --  54* 55* 52* 47* 50*  CREATININE 2.62*  --  2.56* 2.41* 2.08* 1.61* 1.37*  CALCIUM  8.1*  --  8.3* 8.4* 9.1 8.6* 9.1  MG 1.5*  --   --  2.3 2.2 2.0 1.7  PHOS 5.3*  --   --  4.5 3.9 2.9 2.4*   < > = values in this interval not displayed.   GFR: Estimated Creatinine Clearance: 59.8 mL/min (A) (by C-G formula based on SCr of 1.37 mg/dL (H)). Recent Labs  Lab 06/27/23 0604 06/27/23 0813 06/27/23 9175 06/28/23 0305 06/29/23 0403 06/30/23 0358 07/01/23 0542 07/02/23 0448  PROCALCITON  --  1.65  --   --   --   --   --   --   WBC  --   --   --    < > 10.0 7.3 4.2 5.9  LATICACIDVEN 1.8  --  0.9   --   --   --   --   --    < > = values in this interval not displayed.    Liver Function Tests: Recent Labs  Lab 06/27/23 0558  AST 18  ALT 14  ALKPHOS 107  BILITOT 0.6  PROT 7.0  ALBUMIN  3.1*   No results for input(s): LIPASE, AMYLASE in the  last 168 hours. Recent Labs  Lab 06/27/23 0925  AMMONIA 30    ABG    Component Value Date/Time   PHART 7.211 (L) 06/28/2023 0803   PCO2ART 51.2 (H) 06/28/2023 0803   PO2ART 99 06/28/2023 0803   HCO3 20.4 06/28/2023 0803   TCO2 22 06/28/2023 0803   ACIDBASEDEF 7.0 (H) 06/28/2023 0803   O2SAT 96 06/28/2023 0803     Coagulation Profile: Recent Labs  Lab 06/27/23 0558  INR 1.1    Cardiac Enzymes: No results for input(s): CKTOTAL, CKMB, CKMBINDEX, TROPONINI in the last 168 hours.  HbA1C: Hemoglobin A1C  Date/Time Value Ref Range Status  08/14/2016 12:00 AM 5.6  Final  01/18/2016 12:00 AM 5.5  Final   Hgb A1c MFr Bld  Date/Time Value Ref Range Status  06/27/2023 09:25 AM 6.4 (H) 4.8 - 5.6 % Final    Comment:    (NOTE) Pre diabetes:          5.7%-6.4%  Diabetes:              >6.4%  Glycemic control for   <7.0% adults with diabetes   12/03/2022 04:54 PM 6.9 (H) 4.8 - 5.6 % Final    Comment:    (NOTE) Pre diabetes:          5.7%-6.4%  Diabetes:              >6.4%  Glycemic control for   <7.0% adults with diabetes     CBG: Recent Labs  Lab 07/01/23 1124 07/01/23 1547 07/01/23 1949 07/01/23 2330 07/02/23 0400  GLUCAP 154* 153* 156* 157* 197*    Review of Systems:   Unable to obtain  Past Medical History:  He,  has a past medical history of Acquired absence of left great toe (HCC), Acquired absence of right leg below knee (HCC), Acute osteomyelitis of calcaneum, right (HCC) (10/02/2016), Acute respiratory failure (HCC), Amputation stump infection (HCC) (08/12/2018), Anemia, Basal cell carcinoma, eyelid, Cancer (HCC), Candidiasis (08/12/2018), CHF (congestive heart failure) (HCC), Chronic  back pain, Chronic kidney disease, Chronic multifocal osteomyelitis (HCC), Chronic pain syndrome, COPD (chronic obstructive pulmonary disease) (HCC), DDD (degenerative disc disease), lumbar, Depression, Diabetes mellitus without complication (HCC), Diabetic ulcer of toe of left foot (HCC) (10/02/2016), Difficult intubation, Difficulty in walking, not elsewhere classified, 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), Foot drop, bilateral, Foot drop, right (12/27/2015), GERD (gastroesophageal reflux disease), Herpesviral vesicular dermatitis, History of COVID-19 (03/15/2022), Hyperlipidemia, Hyperlipidemia, Hypertension, Insomnia, Malignant melanoma of other parts of face (HCC), Melanoma (HCC), MRSA bacteremia, Muscle weakness (generalized), Nasal congestion, Necrosis of toe (HCC) (07/08/2018), Neuromuscular dysfunction of bladder, Osteomyelitis (HCC), Osteomyelitis (HCC), Osteomyelitis (HCC), Other idiopathic peripheral autonomic neuropathy, Retention of urine, unspecified, Squamous cell carcinoma of skin, Stroke (HCC), Unsteadiness on feet, Urinary retention, Urinary retention, Urinary tract infection, Wears dentures, and Wears glasses.   Surgical History:   Past Surgical History:  Procedure Laterality Date   AMPUTATION Left 03/29/2017   Procedure: LEFT GREAT TOE AMPUTATION AT METATARSOPHALANGEAL JOINT;  Surgeon: Harden Jerona GAILS, MD;  Location: [facility] OR;  Service: Orthopedics;  Laterality: Left;   AMPUTATION Right 03/29/2017   Procedure: RIGHT BELOW KNEE AMPUTATION;  Surgeon: Harden Jerona GAILS, MD;  Location: [facility] OR;  Service: Orthopedics;  Laterality: Right;   AMPUTATION Left 06/06/2018   Procedure: LEFT 2ND TOE AMPUTATION;  Surgeon: Harden Jerona GAILS, MD;  Location: [facility] OR;  Service: Orthopedics;  Laterality: Left;   AMPUTATION Right 11/19/2018   Procedure: AMPUTATION ABOVE KNEE;  Surgeon: Harden Jerona GAILS, MD;  Location: [facility] OR;  Service: Orthopedics;  Laterality: Right;   ANTERIOR CERVICAL CORPECTOMY N/A 11/25/2015   Procedure:  ANTERIOR CERVICAL FIVE CORPECTOMY Cervical four - six fusion;  Surgeon: Gerldine Maizes, MD;  Location: [facility] NEURO ORS;  Service: Neurosurgery;  Laterality: N/A;  ANTERIOR CERVICAL FIVE CORPECTOMY Cervical four - six fusion   APPLICATION OF WOUND VAC Right 10/22/2018   Procedure: Application Of Wound Vac;  Surgeon: Harden Jerona GAILS, MD;  Location: [facility] OR;  Service: Orthopedics;  Laterality: Right;   CYSTOSCOPY WITH BIOPSY N/A 05/01/2022   Procedure: CYSTOSCOPY WITH URETHRAL BIOPSY;  Surgeon: Elisabeth Valli BIRCH, MD;  Location: [facility] ORS;  Service: Urology;  Laterality: N/A;  1 HR   CYSTOSCOPY WITH RETROGRADE URETHROGRAM N/A 04/25/2018   Procedure: CYSTOSCOPY WITH RETROGRADE URETHROGRAM/ BALLOON DILATION;  Surgeon: Ottelin, Mark, MD;  Location: [facility] ORS;  Service: Urology;  Laterality: N/A;   CYSTOSCOPY WITH URETHRAL DILATATION N/A 05/01/2022   Procedure: BALLOON DILATION WITH  OPTILUME;  Surgeon: Elisabeth Valli BIRCH, MD;  Location: [facility] ORS;  Service: Urology;  Laterality: N/A;   LAPAROTOMY N/A 12/03/2022   Procedure: EXPLORATORY LAPAROTOMY  total abdominal colectomy with end ileostomy;  Surgeon: Kinsinger, Herlene Righter, MD;  Location: [facility] OR;  Service: General;  Laterality: N/A;   MULTIPLE TOOTH EXTRACTIONS     POSTERIOR CERVICAL FUSION/FORAMINOTOMY N/A 11/29/2015   Procedure: Cervical Three-Cervical Seven Posterior Cervical Laminectomy with Fusion;  Surgeon: Gerldine Maizes, MD;  Location: [facility]UR[facility] OR[facility];  Service: Neurosurgery;  Laterality: N/A;  Cervical Three-Cervical Seven Posterior Cervical Laminectomy with Fusion   STUMP REVISION Right 10/22/2018   Procedure: REVISION RIGHT BELOW KNEE AMPUTATION;  Surgeon: Harden Jerona GAILS, MD;  Location: [facility] OR;  Service: Orthopedics;  Laterality: Right;   STUMP REVISION Right 12/05/2018   Procedure: Revision Right Above Knee Amputation;  Surgeon: Harden Jerona GAILS, MD;  Location: [facility] OR;  Service: Orthopedics;  Laterality: Right;   STUMP REVISION Right 05/29/2019   Procedure: REVISION  RIGHT ABOVE KNEE AMPUTATION;  Surgeon: Harden Jerona GAILS, MD;  Location: [facility] OR;  Service: Orthopedics;  Laterality: Right;   STUMP REVISION Right 06/24/2019   Procedure: STUMP REVISION;  Surgeon: Harden Jerona GAILS, MD;  Location: [facility] OR;  Service: Orthopedics;  Laterality: Right;     Social History:   reports that he has been smoking cigarettes. He has never used smokeless tobacco. He reports that he does not drink alcohol  and does not use drugs.   Family History:  His family history includes Bone cancer in his father; Cancer in his father, mother, paternal grandfather, and another family member; Diabetes in his mother and another family member; Heart disease in his mother; Prostate cancer in his father.   Allergies Allergies  Allergen Reactions   Latex Other (See Comments)    ALLERGIC, per [facility]     Home Medications  Prior to Admission medications   Medication Sig Start Date End Date Taking? Authorizing Provider  acetaminophen  (TYLENOL ) 500 MG tablet Take 1,000 mg by mouth in the morning and at bedtime.   Yes [provider]  aspirin  EC 81 MG tablet Take 81 mg by mouth daily. Swallow whole.   Yes [provider]  atorvastatin  (LIPITOR ) 80 MG tablet Take 1 tablet (80 mg total) by mouth daily. Patient taking differently: Take 80 mg by mouth at bedtime. 09/22/22 01/27/24 Yes Cindy Garnette POUR, MD  carvedilol  (COREG ) 12.5 MG tablet Take 1 tablet (12.5 mg total) by mouth 2 (two) times daily with a meal. 12/25/22  Yes Raenelle Coria, MD  Cholecalciferol  25 MCG (1000 UT) capsule Take 2,000 Units by mouth daily.   Yes [provider]  Cranberry 450 MG CAPS Take 450 mg by mouth daily.   Yes [provider]  cyclobenzaprine  (FLEXERIL ) 5 MG tablet Take 5 mg by mouth every 8 (eight) hours as needed for muscle spasms.   Yes [provider]  Dextran 70-Hypromellose (NATURAL BALANCE TEARS) 0.1-0.3 % SOLN Place 1 drop into both eyes every 4 (four) hours as needed (dry  eyes).   Yes [provider]  escitalopram  (LEXAPRO ) 20 MG tablet Take 20 mg by mouth daily.   Yes [provider]  ferrous sulfate  325 (65 FE) MG tablet Take 1 tablet (325 mg total) by mouth 3 (three) times daily with meals. Patient taking differently: Take 325 mg by mouth daily. With meal. 01/23/23  Yes Bernard Drivers, MD  fluticasone  (FLONASE ) 50 MCG/ACT nasal spray Place 1 spray into both nostrils daily.   Yes [provider]  furosemide  (LASIX ) 20 MG tablet Take 20-40 mg by mouth See admin instructions. Take 40 mg by mouth in the morning and 20 mg by mouth in the afternoon   Yes [provider]  haloperidol  lactate (HALDOL ) 5 MG/ML injection Inject 1 mg into the muscle every 6 (six) hours as needed (agitation/psychosis).   Yes [provider]  hydrALAZINE  (APRESOLINE ) 10 MG tablet Take 1 tablet (10 mg total) by mouth every 8 (eight) hours. 12/25/22  Yes Raenelle Coria, MD  hydrOXYzine  (VISTARIL ) 50 MG capsule Take 50 mg by mouth at bedtime.   Yes [provider]  insulin  glargine (LANTUS  SOLOSTAR) 100 UNIT/ML Solostar Pen Inject 15 Units into the skin daily. Patient taking differently: Inject 15 Units into the skin at bedtime. 09/07/22  Yes Rai, Ripudeep K, MD  insulin  lispro (HUMALOG ) 100 UNIT/ML KwikPen Inject 0-10 Units into the skin See admin instructions. Inject 0-10 units into the skin three times a day and at bedtime, PER SLIDING SCALE:  BGL 151-200 = 2 units 201-250 = 4 units 251-300 = 6 units 301-350 =  8 units 351-400 = 10 units Patient taking differently: Inject 0-10 Units into the skin See admin instructions. Inject 0-10 units into the skin before meals and at bedtime, PER SLIDING SCALE:  0-150 = 0 units 151-200 = 2 units 201-250 = 4 units 251-300 = 6 units 301-350 =  8 units 351-400 = 10 units 01/31/22  Yes Dahal, Chapman, MD  ipratropium-albuterol  (DUONEB) 0.5-2.5 (3) MG/3ML SOLN Take 3 mLs by nebulization every 6 (six) hours as  needed (for shortness of breath).   Yes [provider]  LACTOBACILLUS PO Take 1 capsule by mouth in the morning and at bedtime.   Yes [provider]  lidocaine  (LIDODERM ) 5 % Place 2 patches onto the skin daily. Remove & Discard patch within 12 hours or as directed by MD Patient taking differently: Place 1 patch onto the skin in the morning and at bedtime. Take off after 12 hours 12/25/22  Yes Raenelle Coria, MD  loperamide  (IMODIUM ) 2 MG capsule Take 1 capsule (2 mg total) by mouth 2 (two) times daily. 12/25/22  Yes Ghimire, Kuber, MD  loratadine  (CLARITIN ) 10 MG tablet Take 10 mg by mouth in the morning.   Yes [provider]  magnesium  oxide (MAG-OX) 400 (240 Mg) MG tablet Take 400 mg by mouth in the morning, at noon, and at bedtime.   Yes [provider]  Melatonin 10 MG  TABS Take 10 mg by mouth at bedtime.   Yes [provider]  Meth-Hyo-M Bl-Na Phos-Ph Sal (URO-MP) 118 MG CAPS Take 118 mg by mouth 2 (two) times daily.   Yes [provider]  Multiple Vitamin (MULTIVITAMIN WITH MINERALS) TABS tablet Take 1 tablet by mouth daily. 12/25/22  Yes Raenelle Coria, MD  naloxone  (NARCAN ) 4 MG/0.1ML LIQD nasal spray kit Place 0.4 mg into the nose as needed (opiate OD).   Yes [provider]  omeprazole (PRILOSEC) 20 MG capsule Take 20 mg by mouth at bedtime.   Yes [provider]  ondansetron  (ZOFRAN ) 4 MG tablet Take 4 mg by mouth every 8 (eight) hours as needed for nausea or vomiting.   Yes [provider]  oxybutynin  (DITROPAN -XL) 5 MG 24 hr tablet Take 5 mg by mouth in the morning and at bedtime.   Yes [provider]  oxyCODONE  (OXY IR/ROXICODONE ) 5 MG immediate release tablet Take 5 mg by mouth every 6 (six) hours as needed for moderate pain (pain score 4-6).   Yes [provider]  oxyCODONE  (OXYCONTIN ) 15 mg 12 hr tablet Take 15 mg by mouth in the morning, at noon, and at bedtime.   Yes [provider]  OXYGEN Inhale 2 L into the lungs as needed (to maintain sats above 90/SOB).   Yes [provider]  oxymetazoline  (AFRIN) 0.05 % nasal spray Place 1 spray into both nostrils 2 (two) times daily as needed for congestion.   Yes [provider]  pregabalin  (LYRICA ) 100 MG capsule Take 1 capsule (100 mg total) by mouth 2 (two) times daily. Patient taking differently: Take 100 mg by mouth 3 (three) times daily. 12/25/22 06/27/23 Yes Raenelle Coria, MD  pregabalin  (LYRICA ) 25 MG capsule Take 25 mg by mouth 3 (three) times daily.   Yes [provider]  QUEtiapine  (SEROQUEL ) 100 MG tablet Take 100 mg by mouth at bedtime.   Yes [provider]  senna (SENOKOT) 8.6 MG TABS tablet Take 2 tablets by mouth at bedtime.   Yes [provider]  sodium bicarbonate  650 MG tablet Take 650 mg by mouth 2 (two) times daily.   Yes [provider]  tamsulosin  (FLOMAX ) 0.4 MG CAPS capsule Take 2 capsules (0.8 mg total) by mouth daily. Patient taking differently: Take 0.4 mg by mouth daily. 02/09/23  Yes Lenard Calin, MD  tirzepatide [facility]) 2.5 MG/0.5ML Pen Inject 2.5 mg into the skin once a week. On Tuesdays   Yes [provider]  trolamine salicylate (ASPERCREME) 10 % cream Apply 1 Application topically in the morning, at noon, in the evening, and at bedtime. Right leg   Yes [provider]  UNABLE TO FIND Take 30 mLs by mouth in the morning and at bedtime. Med Name: liquid protein   Yes [provider]     Critical care time: 30 min

## 2023-07-02 NOTE — Progress Notes (Signed)
 eLink Physician-Brief Progress Note Patient Name: Gregory [name] DOB: 07/30/1960 MRN: [id number]   Date of Service  07/02/2023  HPI/Events of Note  63 y.o. male with past medical history of COPD, A-fib, CKD stage IIIb, mesenteric ischemia s/p ileostomy (12/2022), type 2 diabetes, HFpEF, HTN, and HLD who presented to the ED from SNF on 06/27/2023 with altered mental status.   Ongoing intermittent agitation, ventilated  eICU Interventions  Renew restraints for patient's safety     Intervention Category Minor Interventions: Routine modifications to care plan (e.g. PRN medications for pain, fever)  Inga Noller 07/02/2023, 8:15 PM

## 2023-07-02 NOTE — Progress Notes (Signed)
 PT Cancellation Note  Patient Details Name: Gregory [name] MRN: [id number] DOB: [birth year]   Cancelled Treatment:    Reason Eval/Treat Not Completed: Patient's level of consciousness  Spoke with RN to see if pt appropriate for comprehensive PT evaluation. Reports pt only responding to painful stimuli, has been restless/agitated. Recommended hold PT at this time.  Per dept guidelines after 3rd attempt we will sign-off from our service and wait for patient to become more alert to participate with assessment. Please re-order when patient appears to be able to tolerate PT functional evaluation.  Thank you!  Gregory [name], PT, DPT [facility]  Rehabilitation Services Physical Therapist Office: [phone] Website: [email or web address]  Gregory [name] 07/02/2023, 9:26 AM

## 2023-07-03 DIAGNOSIS — J9601 Acute respiratory failure with hypoxia: Secondary | ICD-10-CM | POA: Diagnosis not present

## 2023-07-03 DIAGNOSIS — I63431 Cerebral infarction due to embolism of right posterior cerebral artery: Secondary | ICD-10-CM

## 2023-07-03 LAB — GLUCOSE, CAPILLARY
Glucose-Capillary: 99 mg/dL (ref 70–99)
Glucose-Capillary: 155 mg/dL — ABNORMAL HIGH (ref 70–99)
Glucose-Capillary: 169 mg/dL — ABNORMAL HIGH (ref 70–99)
Glucose-Capillary: 152 mg/dL — ABNORMAL HIGH (ref 70–99)
Glucose-Capillary: 149 mg/dL — ABNORMAL HIGH (ref 70–99)

## 2023-07-03 LAB — CBC WITH DIFFERENTIAL/PLATELET
Abs Immature Granulocytes: 0.26 10*3/uL — ABNORMAL HIGH (ref 0.00–0.07)
Basophils Absolute: 0 10*3/uL (ref 0.0–0.1)
Basophils Relative: 1 %
Eosinophils Absolute: 0.4 10*3/uL (ref 0.0–0.5)
Eosinophils Relative: 5 %
HCT: 27.3 % — ABNORMAL LOW (ref 39.0–52.0)
Hemoglobin: 8.5 g/dL — ABNORMAL LOW (ref 13.0–17.0)
Immature Granulocytes: 4 %
Lymphocytes Relative: 33 %
Lymphs Abs: 2.5 10*3/uL (ref 0.7–4.0)
MCH: 28.4 pg (ref 26.0–34.0)
MCHC: 31.1 g/dL (ref 30.0–36.0)
MCV: 91.3 fL (ref 80.0–100.0)
Monocytes Absolute: 0.7 10*3/uL (ref 0.1–1.0)
Monocytes Relative: 9 %
Neutro Abs: 3.7 10*3/uL (ref 1.7–7.7)
Neutrophils Relative %: 48 %
Platelets: 151 10*3/uL (ref 150–400)
RBC: 2.99 MIL/uL — ABNORMAL LOW (ref 4.22–5.81)
RDW: 17.2 % — ABNORMAL HIGH (ref 11.5–15.5)
WBC: 7.5 10*3/uL (ref 4.0–10.5)
nRBC: 0 % (ref 0.0–0.2)

## 2023-07-03 LAB — BASIC METABOLIC PANEL
Anion gap: 8 (ref 5–15)
BUN: 69 mg/dL — ABNORMAL HIGH (ref 8–23)
CO2: 24 mmol/L (ref 22–32)
Calcium: 9.1 mg/dL (ref 8.9–10.3)
Chloride: 117 mmol/L — ABNORMAL HIGH (ref 98–111)
Creatinine, Ser: 1.5 mg/dL — ABNORMAL HIGH (ref 0.61–1.24)
GFR, Estimated: 52 mL/min — ABNORMAL LOW (ref >60)
Glucose, Bld: 101 mg/dL — ABNORMAL HIGH (ref 70–99)
Potassium: 4.7 mmol/L (ref 3.5–5.1)
Sodium: 149 mmol/L — ABNORMAL HIGH (ref 135–145)

## 2023-07-03 LAB — MAGNESIUM: Magnesium: 1.9 mg/dL (ref 1.7–2.4)

## 2023-07-03 LAB — PHOSPHORUS: Phosphorus: 4 mg/dL (ref 2.5–4.6)

## 2023-07-03 MED ORDER — DEXMEDETOMIDINE HCL IN NACL 400 MCG/100ML IV SOLN
0.0000 ug/kg/h | INTRAVENOUS | Status: DC
  Administered 2023-07-03: 0.5 ug/kg/h via INTRAVENOUS
  Administered 2023-07-03: 0.4 ug/kg/h via INTRAVENOUS
  Administered 2023-07-04: 0.9 ug/kg/h via INTRAVENOUS
  Administered 2023-07-04 (×2): 1 ug/kg/h via INTRAVENOUS
  Administered 2023-07-04: 0.6 ug/kg/h via INTRAVENOUS
  Administered 2023-07-05: 1.1 ug/kg/h via INTRAVENOUS
  Administered 2023-07-05: 0.9 ug/kg/h via INTRAVENOUS
  Administered 2023-07-05 (×3): 1 ug/kg/h via INTRAVENOUS
  Administered 2023-07-06: 0.5 ug/kg/h via INTRAVENOUS
  Administered 2023-07-06: 0.4 ug/kg/h via INTRAVENOUS
  Administered 2023-07-06: 0.5 ug/kg/h via INTRAVENOUS
  Administered 2023-07-07 (×2): 1.1 ug/kg/h via INTRAVENOUS
  Filled 2023-07-03 (×15): qty 100

## 2023-07-03 MED ORDER — FREE WATER
150.0000 mL | Status: DC
  Administered 2023-07-03 – 2023-07-08 (×60): 150 mL

## 2023-07-03 MED ORDER — MAGNESIUM SULFATE 2 GM/50ML IV SOLN
2.0000 g | Freq: Once | INTRAVENOUS | Status: AC
Start: 2023-07-03 — End: 2023-07-03
  Administered 2023-07-03: 2 g via INTRAVENOUS
  Filled 2023-07-03: qty 50

## 2023-07-03 MED ORDER — CLONAZEPAM 1 MG PO TABS
2.0000 mg | ORAL_TABLET | Freq: Three times a day (TID) | ORAL | Status: DC
  Administered 2023-07-03 – 2023-07-06 (×9): 2 mg
  Filled 2023-07-03 (×9): qty 2

## 2023-07-03 NOTE — Progress Notes (Signed)
 At bedside for PIV insertion for CVC removal. Noted pt on multiple IV drips and pt agitated. Spoke with provider , Dr. Verlene Glimpse regarding risks vs benefits of CVC removal. MD agrees and will leave the CVC in at this time. No PIV started. RN aware.

## 2023-07-03 NOTE — Progress Notes (Signed)
 NAME:  Gregory [name], MRN:  [id number], DOB:  1960/08/16, LOS: 6 ADMISSION DATE:  06/27/2023, CONSULTATION DATE:  06/27/23 REFERRING MD:  EDP, CHIEF COMPLAINT:  AMS   History of Present Illness:  63 year old male with significant past medical history including diabetes, hypertension, hyperlipidemia, CKDIIIb, COPD, chronic osteomyelitis s/p R AKA, L midfoot amputation, atrial fibrillation, HFpEF, mesenteric ischemia s/p colostomy 12/2022 who presented to ED from SNF for altered mental status/obtunded. Reported increased liquid stool from ostomy. He was subsequently intubated in the ED. Labs in ED notable for K 5.8, Cr 2.6, WBC 13, trop 83>705, BNP 63, UA large leuk, wbc21-50 few bacteria, UDS negative. Admitted to PCCM.   Pertinent  Medical History  COPD, CKD3b, mesenteric ischemia s/p colostomy 12/2022, T2DM, HFpEF, HTN, HLD   Significant Hospital Events: Including procedures, antibiotic start and stop dates in addition to other pertinent events   1/9: admit from ED for AMS, intubated, CVC 1/10: MRI w/ 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 Stroke team/neuro consulted. Recommend stroke work up, d/c meningitis/encephalitis coverage, palliative consult.  1/10 unsuccessful attempts at lumbar puncture 1/12 significant ventilator dyssynchrony, given 1 dose of Nimbex, on Versed  drip 1/13 agitated overnight despite fentanyl  and versed  pushes and Haldol  1/14 remains agitated with sedation wean   Interim History / Subjective:  Less agitation overnight, but not able to wean sedation.  Remains sedated this morning.   Objective   Blood pressure (!) 100/57, pulse 82, temperature (!) 100.6 F (38.1 C), resp. rate (!) 28, height 6' 0.01" (1.829 m), weight 92.8 kg, SpO2 96%.    Vent Mode: PRVC FiO2 (%):  [40 %] 40 % Set Rate:  [28 bmp] 28 bmp Vt Set:  [600 mL] 600 mL PEEP:  [5 cmH20] 5 cmH20 Plateau Pressure:  [20 cmH20-21 cmH20] 20 cmH20   Intake/Output Summary (Last 24  hours) at 07/03/2023 0625 Last data filed at 07/03/2023 0400 Gross per 24 hour  Intake 2728.56 ml  Output 4625 ml  Net -1896.44 ml   Filed Weights   07/01/23 0450 07/02/23 0500 07/03/23 0411  Weight: 91.2 kg 92.8 kg 92.8 kg    Examination: General: chronically ill appearing, remains sedated on vent HENT: OG tube, ET tube in place Lungs: coarse breath sounds bilaterally Cardiovascular: regular rate, rhythm Abdomen: colostomy present w stool output, soft abdomen Extremities: R AKA, L midfoot amputation; left leg with edema and scabs Neuro: sedated  GU: foley in place  Resolved Hospital Problem list   Thrombocytopenia  Assessment & Plan:  Septic shock 2/2 ESBL UTI and multifocal pneumonia Rhinovirus positive - off pressors - continue cefepime  for total 7 day course  - completed 3 day course of meropenem  for urine - trach aspirate growing klebsiella and serratia   Acute hypoxemic respiratory failure 2/2 multifocal pneumonia COPD Vent settings: PRVC, RR 28, TV 600, PEEP 5, FiO2 40%  - cefepime  as above - continue to wean versed , fentanyl  for sedation  - PSV as tolerated once mentation improves  - VAP protocol  - Brovana , Pulmicort , Yupelri   Fever - temp 100.59F overnight - remove central line and foley today  Encephalopathy/Agitation - LP unsuccessful - Weaning sedation to see if mental status improves  - Continue seroquel  100 mg at bedtime, klonopin  1 mg TID, and lyrica  125 mg TID   Chronic pain - On oxycontin  15 mg BID and oxycodone  5 mg q6h prn at home - increased oxycodone  to 20 mg q6h while weaning sedation  - suspect opioid  tolerance is affecting ability to wean sedation   AKI on CKD IIIb - Cr slightly worsened at 1.5 - 4.3 L UOP in last 24 hrs - maintain renal perfusion - avoid nephrotoxic meds - replete electrolytes prn  Hypernatremia - 3.6 L free water  deficit  - increase free water  to 150 mL q2h   Normocytic anemia - Hb stable at 8.5 (1u prbc on  1/13) - [place] Hb >7  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  Order written to d/c  Foley:  Yes, and it is no longer needed Code Status:  full code Last date of multidisciplinary goals of care discussion Gael Jolly, updated at bedside 1/13]  Labs   CBC: Recent Labs  Lab 06/27/23 0558 06/27/23 0838 06/29/23 0403 06/30/23 0358 07/01/23 0542 07/02/23 0448 07/03/23 0405  WBC 13.0*   < > 10.0 7.3 4.2 5.9 7.5  NEUTROABS 11.0*  --   --   --   --   --  3.7  HGB 11.7*   < > 8.7* 8.5* 6.9* 8.6* 8.5*  HCT 38.1*   < > 27.4* 26.9* 21.3* 26.6* 27.3*  MCV 95.0   < > 91.0 91.2 92.2 89.0 91.3  PLT 311   < > 147* 135* 100* 135* 151   < > = values in this interval not displayed.    Basic Metabolic Panel: Recent Labs  Lab 06/29/23 0403 06/30/23 0358 07/01/23 0542 07/02/23 0448 07/03/23 0405  NA 135 140 145 146* 149*  K 4.4 4.5 4.1 4.1 4.7  CL 105 109 117* 117* 117*  CO2 18* 21* 22 20* 24  GLUCOSE 124* 117* 180* 183* 101*  BUN 55* 52* 47* 50* 69*  CREATININE 2.41* 2.08* 1.61* 1.37* 1.50*  CALCIUM  8.4* 9.1 8.6* 9.1 9.1  MG 2.3 2.2 2.0 1.7 1.9  PHOS 4.5 3.9 2.9 2.4* 4.0   GFR: Estimated Creatinine Clearance: 54.6 mL/min (A) (by C-G formula based on SCr of 1.5 mg/dL (H)). Recent Labs  Lab 06/27/23 0604 06/27/23 0813 06/27/23 4098 06/28/23 0305 06/30/23 0358 07/01/23 0542 07/02/23 0448 07/03/23 0405  PROCALCITON  --  1.65  --   --   --    --   --   --   WBC  --   --   --    < > 7.3 4.2 5.9 7.5  LATICACIDVEN 1.8  --  0.9  --   --   --   --   --    < > = values in this interval not displayed.    Liver Function Tests: Recent Labs  Lab 06/27/23 0558  AST 18  ALT 14  ALKPHOS 107  BILITOT 0.6  PROT 7.0  ALBUMIN  3.1*   No results for input(s): "LIPASE", "AMYLASE" in the last 168 hours. Recent Labs  Lab 06/27/23 0925  AMMONIA 30    ABG    Component Value Date/Time   PHART 7.211 (L)  06/28/2023 0803   PCO2ART 51.2 (H) 06/28/2023 0803   PO2ART 99 06/28/2023 0803   HCO3 20.4 06/28/2023 0803   TCO2 22 06/28/2023 0803   ACIDBASEDEF 7.0 (H) 06/28/2023 0803   O2SAT 96 06/28/2023 0803     Coagulation Profile: Recent Labs  Lab 06/27/23 0558  INR 1.1    Cardiac Enzymes: No results for input(s): "CKTOTAL", "CKMB", "CKMBINDEX", "TROPONINI" in the last 168 hours.  HbA1C: Hemoglobin A1C  Date/Time Value Ref Range Status  08/14/2016 12:00 AM 5.6  Final  01/18/2016 12:00 AM 5.5  Final   Hgb A1c MFr Bld  Date/Time Value Ref Range Status  06/27/2023 09:25 AM 6.4 (H) 4.8 - 5.6 % Final    Comment:    (NOTE) Pre diabetes:          5.7%-6.4%  Diabetes:              >6.4%  Glycemic control for   <7.0% adults with diabetes   12/03/2022 04:54 PM 6.9 (H) 4.8 - 5.6 % Final    Comment:    (NOTE) Pre diabetes:          5.7%-6.4%  Diabetes:              >6.4%  Glycemic control for   <7.0% adults with diabetes     CBG: Recent Labs  Lab 07/02/23 1122 07/02/23 1609 07/02/23 1940 07/02/23 2352 07/03/23 0352  GLUCAP 213* 152* 179* 162* 99    Review of Systems:   Unable to obtain  Past Medical History:  He,  has a past medical history of Acquired absence of left great toe (HCC), Acquired absence of right leg below knee (HCC), Acute osteomyelitis of calcaneum, right (HCC) (10/02/2016), Acute respiratory failure (HCC), Amputation stump infection (HCC) (08/12/2018), Anemia, Basal cell carcinoma, eyelid,  Cancer (HCC), Candidiasis (08/12/2018), CHF (congestive heart failure) (HCC), Chronic back pain, Chronic kidney disease, Chronic multifocal osteomyelitis (HCC), Chronic pain syndrome, COPD (chronic obstructive pulmonary disease) (HCC), DDD (degenerative disc disease), lumbar, Depression, Diabetes mellitus without complication (HCC), Diabetic ulcer of toe of left foot (HCC) (10/02/2016), Difficult intubation, Difficulty in walking, not elsewhere classified, 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), Foot drop, bilateral, Foot drop, right (12/27/2015), GERD (gastroesophageal reflux disease), Herpesviral vesicular dermatitis, History of COVID-19 (03/15/2022), Hyperlipidemia, Hyperlipidemia, Hypertension, Insomnia, Malignant melanoma of other parts of face (HCC), Melanoma (HCC), MRSA bacteremia, Muscle weakness (generalized), Nasal congestion, Necrosis of toe (HCC) (07/08/2018), Neuromuscular dysfunction of bladder, Osteomyelitis (HCC), Osteomyelitis (HCC), Osteomyelitis (HCC), Other idiopathic peripheral autonomic neuropathy, Retention of urine, unspecified, Squamous cell carcinoma of skin, Stroke (HCC), Unsteadiness on feet, Urinary retention, Urinary retention, Urinary tract infection, Wears dentures, and Wears glasses.   Surgical History:   Past Surgical History:  Procedure Laterality Date   AMPUTATION Left 03/29/2017   Procedure: LEFT GREAT TOE AMPUTATION AT METATARSOPHALANGEAL JOINT;  Surgeon: Timothy Ford, MD;  Location: [facility] OR;  Service: Orthopedics;  Laterality: Left;   AMPUTATION Right 03/29/2017   Procedure: RIGHT BELOW KNEE AMPUTATION;  Surgeon: Timothy Ford, MD;  Location: [facility] OR;  Service: Orthopedics;  Laterality: Right;   AMPUTATION Left 06/06/2018   Procedure: LEFT 2ND TOE AMPUTATION;  Surgeon: Timothy Ford, MD;  Location: [facility] OR;  Service: Orthopedics;  Laterality: Left;   AMPUTATION Right 11/19/2018   Procedure: AMPUTATION ABOVE KNEE;  Surgeon: Timothy Ford, MD;  Location: [facility] OR;  Service:  Orthopedics;  Laterality: Right;   ANTERIOR CERVICAL CORPECTOMY N/A 11/25/2015   Procedure: ANTERIOR CERVICAL FIVE CORPECTOMY Cervical four -  six fusion;  Surgeon: Augusto Blonder, MD;  Location: [facility] ORS;  Service: Neurosurgery;  Laterality: N/A;  ANTERIOR CERVICAL FIVE CORPECTOMY Cervical four - six fusion   APPLICATION OF WOUND VAC Right 10/22/2018   Procedure: Application Of Wound Vac;  Surgeon: Timothy Ford, MD;  Location: [facility] OR;  Service: Orthopedics;  Laterality: Right;   CYSTOSCOPY WITH BIOPSY N/A 05/01/2022   Procedure: CYSTOSCOPY WITH URETHRAL BIOPSY;  Surgeon: Roxane Copp, MD;  Location: [facility] ORS;  Service: Urology;  Laterality: N/A;  1 HR   CYSTOSCOPY WITH RETROGRADE URETHROGRAM N/A 04/25/2018   Procedure: CYSTOSCOPY WITH RETROGRADE URETHROGRAM/ BALLOON DILATION;  Surgeon: Ottelin, Mark, MD;  Location: [facility] ORS;  Service: Urology;  Laterality: N/A;   CYSTOSCOPY WITH URETHRAL DILATATION N/A 05/01/2022   Procedure: BALLOON DILATION WITH  OPTILUME;  Surgeon: Roxane Copp, MD;  Location: [facility] ORS;  Service: Urology;  Laterality: N/A;   LAPAROTOMY N/A 12/03/2022   Procedure: EXPLORATORY LAPAROTOMY  total abdominal colectomy with end ileostomy;  Surgeon: Kinsinger, Alphonso Aschoff, MD;  Location: [facility] OR;  Service: General;  Laterality: N/A;   MULTIPLE TOOTH EXTRACTIONS     POSTERIOR CERVICAL FUSION/FORAMINOTOMY N/A 11/29/2015   Procedure: Cervical Three-Cervical Seven Posterior Cervical Laminectomy with Fusion;  Surgeon: Augusto Blonder, MD;  Location: [facility] ORS;  Service: Neurosurgery;  Laterality: N/A;  Cervical Three-Cervical Seven Posterior Cervical Laminectomy with Fusion   STUMP REVISION Right 10/22/2018   Procedure: REVISION RIGHT BELOW KNEE AMPUTATION;  Surgeon: Timothy Ford, MD;  Location: [facility] OR;  Service: Orthopedics;  Laterality: Right;   STUMP REVISION Right 12/05/2018   Procedure: Revision Right Above Knee Amputation;  Surgeon: Timothy Ford, MD;  Location: [facility] OR;  Service:  Orthopedics;  Laterality: Right;   STUMP REVISION Right 05/29/2019   Procedure: REVISION RIGHT ABOVE KNEE AMPUTATION;  Surgeon: Timothy Ford, MD;  Location: [facility] OR;  Service: Orthopedics;  Laterality: Right;   STUMP REVISION Right 06/24/2019   Procedure: STUMP REVISION;  Surgeon: Timothy Ford, MD;  Location: [facility] OR;  Service: Orthopedics;  Laterality: Right;     Social History:   reports that he has been smoking cigarettes. He has never used smokeless tobacco. He reports that he does not drink alcohol  and does not use drugs.   Family History:  His family history includes Bone cancer in his father; Cancer in his father, mother, paternal grandfather, and another family member; Diabetes in his mother and another family member; Heart disease in his mother; Prostate cancer in his father.   Allergies Allergies  Allergen Reactions   Latex Other (See Comments)    "ALLERGIC," per [facility]     Home Medications  Prior to Admission medications   Medication Sig Start Date End Date Taking? Authorizing Provider  acetaminophen  (TYLENOL ) 500 MG tablet Take 1,000 mg by mouth in the morning and at bedtime.   Yes [provider]  aspirin  EC 81 MG tablet Take 81 mg by mouth daily. Swallow whole.   Yes [provider]  atorvastatin  (LIPITOR ) 80 MG tablet Take 1 tablet (80 mg total) by mouth daily. Patient taking differently: Take 80 mg by mouth at bedtime. 09/22/22 01/27/24 Yes Oral Billings, MD  carvedilol  (COREG ) 12.5 MG tablet Take 1 tablet (12.5 mg total) by mouth 2 (two) times daily with a meal. 12/25/22  Yes Ghimire, Letty Raya, MD  Cholecalciferol  25 MCG (1000 UT) capsule Take 2,000 Units by mouth daily.   Yes [provider]  Cranberry 450 MG  CAPS Take 450 mg by mouth daily.   Yes [provider]  cyclobenzaprine  (FLEXERIL ) 5 MG tablet Take 5 mg by mouth every 8 (eight) hours as needed for muscle spasms.   Yes [provider]  Dextran 70-Hypromellose (NATURAL BALANCE  TEARS) 0.1-0.3 % SOLN Place 1 drop into both eyes every 4 (four) hours as needed (dry eyes).   Yes [provider]  escitalopram  (LEXAPRO ) 20 MG tablet Take 20 mg by mouth daily.   Yes [provider]  ferrous sulfate  325 (65 FE) MG tablet Take 1 tablet (325 mg total) by mouth 3 (three) times daily with meals. Patient taking differently: Take 325 mg by mouth daily. With meal. 01/23/23  Yes Guadalupe Lee, MD  fluticasone  (FLONASE ) 50 MCG/ACT nasal spray Place 1 spray into both nostrils daily.   Yes [provider]  furosemide  (LASIX ) 20 MG tablet Take 20-40 mg by mouth See admin instructions. Take 40 mg by mouth in the morning and 20 mg by mouth in the afternoon   Yes [provider]  haloperidol  lactate (HALDOL ) 5 MG/ML injection Inject 1 mg into the muscle every 6 (six) hours as needed (agitation/psychosis).   Yes [provider]  hydrALAZINE  (APRESOLINE ) 10 MG tablet Take 1 tablet (10 mg total) by mouth every 8 (eight) hours. 12/25/22  Yes Vada Garibaldi, MD  hydrOXYzine  (VISTARIL ) 50 MG capsule Take 50 mg by mouth at bedtime.   Yes [provider]  insulin  glargine (LANTUS  SOLOSTAR) 100 UNIT/ML Solostar Pen Inject 15 Units into the skin daily. Patient taking differently: Inject 15 Units into the skin at bedtime. 09/07/22  Yes Rai, Ripudeep K, MD  insulin  lispro (HUMALOG ) 100 UNIT/ML KwikPen Inject 0-10 Units into the skin See admin instructions. Inject 0-10 units into the skin three times a day and at bedtime, PER SLIDING SCALE:  BGL 151-200 = 2 units 201-250 = 4 units 251-300 = 6 units 301-350 =  8 units 351-400 = 10 units Patient taking differently: Inject 0-10 Units into the skin See admin instructions. Inject 0-10 units into the skin before meals and at bedtime, PER SLIDING SCALE:  0-150 = 0 units 151-200 = 2 units 201-250 = 4 units 251-300 = 6 units 301-350 =  8 units 351-400 = 10 units 01/31/22  Yes Dahal, Aminta Baldy, MD   ipratropium-albuterol  (DUONEB) 0.5-2.5 (3) MG/3ML SOLN Take 3 mLs by nebulization every 6 (six) hours as needed (for shortness of breath).   Yes [provider]  LACTOBACILLUS PO Take 1 capsule by mouth in the morning and at bedtime.   Yes [provider]  lidocaine  (LIDODERM ) 5 % Place 2 patches onto the skin daily. Remove & Discard patch within 12 hours or as directed by MD Patient taking differently: Place 1 patch onto the skin in the morning and at bedtime. Take off after 12 hours 12/25/22  Yes Ghimire, Letty Raya, MD  loperamide  (IMODIUM ) 2 MG capsule Take 1 capsule (2 mg total) by mouth 2 (two) times daily. 12/25/22  Yes Vada Garibaldi, MD  loratadine  (CLARITIN ) 10 MG tablet Take 10 mg by mouth in the morning.   Yes [provider]  magnesium  oxide (MAG-OX) 400 (240 Mg) MG tablet Take 400 mg by mouth in the morning, at noon, and at bedtime.   Yes [provider]  Melatonin 10 MG TABS Take 10 mg by mouth at bedtime.   Yes [provider]  Meth-Hyo-M Bl-Na Phos-Ph Sal (URO-MP) 118 MG CAPS Take 118 mg by  mouth 2 (two) times daily.   Yes [provider]  Multiple Vitamin (MULTIVITAMIN WITH MINERALS) TABS tablet Take 1 tablet by mouth daily. 12/25/22  Yes Vada Garibaldi, MD  naloxone  (NARCAN ) 4 MG/0.1ML LIQD nasal spray kit Place 0.4 mg into the nose as needed (opiate OD).   Yes [provider]  omeprazole (PRILOSEC) 20 MG capsule Take 20 mg by mouth at bedtime.   Yes [provider]  ondansetron  (ZOFRAN ) 4 MG tablet Take 4 mg by mouth every 8 (eight) hours as needed for nausea or vomiting.   Yes [provider]  oxybutynin  (DITROPAN -XL) 5 MG 24 hr tablet Take 5 mg by mouth in the morning and at bedtime.   Yes [provider]  oxyCODONE  (OXY IR/ROXICODONE ) 5 MG immediate release tablet Take 5 mg by mouth every 6 (six) hours as needed for moderate pain (pain score 4-6).   Yes [provider]  oxyCODONE   (OXYCONTIN ) 15 mg 12 hr tablet Take 15 mg by mouth in the morning, at noon, and at bedtime.   Yes [provider]  OXYGEN Inhale 2 L into the lungs as needed (to maintain sats above 90/SOB).   Yes [provider]  oxymetazoline  (AFRIN) 0.05 % nasal spray Place 1 spray into both nostrils 2 (two) times daily as needed for congestion.   Yes [provider]  pregabalin  (LYRICA ) 100 MG capsule Take 1 capsule (100 mg total) by mouth 2 (two) times daily. Patient taking differently: Take 100 mg by mouth 3 (three) times daily. 12/25/22 06/27/23 Yes Vada Garibaldi, MD  pregabalin  (LYRICA ) 25 MG capsule Take 25 mg by mouth 3 (three) times daily.   Yes [provider]  QUEtiapine  (SEROQUEL ) 100 MG tablet Take 100 mg by mouth at bedtime.   Yes [provider]  senna (SENOKOT) 8.6 MG TABS tablet Take 2 tablets by mouth at bedtime.   Yes [provider]  sodium bicarbonate  650 MG tablet Take 650 mg by mouth 2 (two) times daily.   Yes [provider]  tamsulosin  (FLOMAX ) 0.4 MG CAPS capsule Take 2 capsules (0.8 mg total) by mouth daily. Patient taking differently: Take 0.4 mg by mouth daily. 02/09/23  Yes Brayton Calin, MD  tirzepatide [facility]) 2.5 MG/0.5ML Pen Inject 2.5 mg into the skin once a week. On Tuesdays   Yes [provider]  trolamine salicylate (ASPERCREME) 10 % cream Apply 1 Application topically in the morning, at noon, in the evening, and at bedtime. Right leg   Yes [provider]  UNABLE TO FIND Take 30 mLs by mouth in the morning and at bedtime. Med Name: liquid protein   Yes [provider]     Critical care time: 35 min

## 2023-07-03 NOTE — Progress Notes (Signed)
 eLink Physician-Brief Progress Note Patient Name: Gregory [name] DOB: 1961/02/09 MRN: [id number]   Date of Service  07/03/2023  HPI/Events of Note    eICU Interventions  Restraints renewed until a.m. evaluation on rounds     Intervention Category Minor Interventions: Routine modifications to care plan (e.g. PRN medications for pain, fever)  Jennifer Moellers. Khaliq Turay 07/03/2023, 11:07 PM

## 2023-07-03 NOTE — Progress Notes (Signed)
 [facility] ADULT ICU REPLACEMENT PROTOCOL   The patient does apply for the [facility] Adult ICU Electrolyte Replacment Protocol based on the criteria listed below:   1.Exclusion criteria: TCTS, ECMO, Dialysis, and Myasthenia Gravis patients 2. Is GFR >/= 30 ml/min? Yes.    Patient's GFR today is 52 3. Is SCr </= 2? Yes.   Patient's SCr is 1.50 mg/dL 4. Did SCr increase >/= 0.5 in 24 hours? No. 5.[name] weight >40kg  Yes.   6. Abnormal electrolyte(s): mag 1.9  7. Electrolytes replaced per protocol 8.  Call MD STAT for K+ </= 2.5, Phos </= 1, or Mag </= 1 Physician:  protocol  Marva Sleight 07/03/2023 6:22 AM

## 2023-07-03 NOTE — Progress Notes (Signed)
   Palliative Medicine Inpatient Follow Up Note   HPI: 63 y.o. 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  He was intubated in the ED for airway protection.  Initial workup revealed elevated creatinine and mild leukocytosis.  CT head was abnormal with concern for acute infarct.  He is admitted with acute encephalopathy, right sided pneumonia, and sepsis.   Palliative Medicine is consulted for goals of care.  Today's Discussion 07/03/2023  *Please note that this is a verbal dictation therefore any spelling or grammatical errors are due to the "[employer] One" system interpretation.  Chart reviewed inclusive of vital signs, progress notes, laboratory results, and diagnostic images.   I met with Dr. Janett Medin who shares that patients neuro exam is complicated as it has been tough to wean him off sedatives and to date this has not been done completely. He does note that the patient will awaken and has some purposeful movement.   On assessment this morning, Gregory [name] was noted to be back on fentanyl  and midazolam . The team is switching him to precedex . He does have responses to painful stimulation.   I called and spoke to patients daughter, Gregory [name] She shares that she plans to be here tomorrow and Friday. She acknowledges her fathers present condition though feels that if his underlying pain was optimized his recovery may appear more promising.   Gregory [name] shares that she wants to allow time to see if he can improve as she has seen him pull through some very tough situations prior. Gregory [name] is open to meeting with [place], CCM, and neurology in the oncoming days.   Questions and concerns addressed/Palliative Support Provided.   Objective Assessment: Vital Signs Vitals:   07/03/23 0808 07/03/23 1059  BP:  (!) 208/100  Pulse:  (!) 132   Resp:  (!) 30  Temp:  (!) 100.6 F (38.1 C)  SpO2: 95% 97%    Intake/Output Summary (Last 24 hours) at 07/03/2023 1247 Last data filed at 07/03/2023 1100 Gross per 24 hour  Intake 2611.96 ml  Output 3025 ml  Net -413.04 ml   Last Weight  Most recent update: 07/03/2023  4:11 AM    Weight  92.8 kg (204 lb 9.4 oz)            Gen:  Acutely ill appearing M  HEENT: ETT, NGT, dry mucous membranes CV: Regular rate and rhythm  PULM:  On mechanical ventilator ABD: soft/nontender  EXT: RLE AKA Neuro: Somnolent - on sedation  SUMMARY OF RECOMMENDATIONS   Full Code  Allowing time for outcomes  CCM is weaning off sedation though this has been complicated  Ongoing conversations between PMT and patients daughter in the days ahead  MDM: High  ______________________________________________________________________________________ Camille Cedars  Palliative Medicine Team Team Cell Phone: [phone] Please utilize secure chat with additional questions, if there is no response within 30 minutes please call the above phone number  Palliative Medicine Team providers are available by phone from 7am to 7pm daily and can be reached through the team cell phone.  Should this patient require assistance outside of these hours, please call the patient's attending physician.

## 2023-07-03 NOTE — Progress Notes (Signed)
 STROKE TEAM PROGRESS NOTE   SUBJECTIVE (INTERVAL HISTORY) His RN is at the bedside. Pt remains intubated   but sedation could not be successfully weaned as he became quite restless and tachypneic and his now been switched to Precedex .  Neurological exam remains limited but he can localize and move all 4 extremities to painful stimuli can be aroused but will not follow commands.  Palliative care team is working to arrange family meeting with daughter tomorrow.  OBJECTIVE Temp:  [99 F (37.2 C)-100.6 F (38.1 C)] 100.6 F (38.1 C) (01/15 1200) Pulse Rate:  [72-132] 87 (01/15 1200) Cardiac Rhythm: Normal sinus rhythm (01/15 0800) Resp:  [23-37] 29 (01/15 1200) BP: (88-208)/(50-100) 119/66 (01/15 1200) SpO2:  [94 %-100 %] 94 % (01/15 1200) FiO2 (%):  [40 %-50 %] 50 % (01/15 1059) Weight:  [92.8 kg] 92.8 kg (01/15 0411)  Recent Labs  Lab 07/02/23 1940 07/02/23 2352 07/03/23 0352 07/03/23 0742 07/03/23 1127  GLUCAP 179* 162* 99 155* 169*   Recent Labs  Lab 06/29/23 0403 06/30/23 0358 07/01/23 0542 07/02/23 0448 07/03/23 0405  NA 135 140 145 146* 149*  K 4.4 4.5 4.1 4.1 4.7  CL 105 109 117* 117* 117*  CO2 18* 21* 22 20* 24  GLUCOSE 124* 117* 180* 183* 101*  BUN 55* 52* 47* 50* 69*  CREATININE 2.41* 2.08* 1.61* 1.37* 1.50*  CALCIUM  8.4* 9.1 8.6* 9.1 9.1  MG 2.3 2.2 2.0 1.7 1.9  PHOS 4.5 3.9 2.9 2.4* 4.0   Recent Labs  Lab 06/27/23 0558  AST 18  ALT 14  ALKPHOS 107  BILITOT 0.6  PROT 7.0  ALBUMIN  3.1*   Recent Labs  Lab 06/27/23 0558 06/27/23 0838 06/29/23 0403 06/30/23 0358 07/01/23 0542 07/02/23 0448 07/03/23 0405  WBC 13.0*   < > 10.0 7.3 4.2 5.9 7.5  NEUTROABS 11.0*  --   --   --   --   --  3.7  HGB 11.7*   < > 8.7* 8.5* 6.9* 8.6* 8.5*  HCT 38.1*   < > 27.4* 26.9* 21.3* 26.6* 27.3*  MCV 95.0   < > 91.0 91.2 92.2 89.0 91.3  PLT 311   < > 147* 135* 100* 135* 151   < > = values in this interval not displayed.   No results for input(s): "CKTOTAL",  "CKMB", "CKMBINDEX", "TROPONINI" in the last 168 hours. No results for input(s): "LABPROT", "INR" in the last 72 hours.  No results for input(s): "COLORURINE", "LABSPEC", "PHURINE", "GLUCOSEU", "HGBUR", "BILIRUBINUR", "KETONESUR", "PROTEINUR", "UROBILINOGEN", "NITRITE", "LEUKOCYTESUR" in the last 72 hours.  Invalid input(s): "APPERANCEUR"      Component Value Date/Time   CHOL 107 06/29/2023 0403   TRIG 521 (H) 06/29/2023 0403   HDL <10 (L) 06/29/2023 0403   CHOLHDL NOT CALCULATED 06/29/2023 0403   VLDL UNABLE TO CALCULATE IF TRIGLYCERIDE OVER 400 mg/dL 16/03/9603 5409   LDLCALC UNABLE TO CALCULATE IF TRIGLYCERIDE OVER 400 mg/dL 81/19/1478 2956   Lab Results  Component Value Date   HGBA1C 6.4 (H) 06/27/2023      Component Value Date/Time   LABOPIA NONE DETECTED 06/27/2023 0748   COCAINSCRNUR NONE DETECTED 06/27/2023 0748   LABBENZ NONE DETECTED 06/27/2023 0748   AMPHETMU NONE DETECTED 06/27/2023 0748   THCU NONE DETECTED 06/27/2023 0748   LABBARB NONE DETECTED 06/27/2023 0748    No results for input(s): "ETH" in the last 168 hours.  I have personally reviewed the radiological images below and agree with the radiology interpretations.  VAS US   TRANSCRANIAL DOPPLER Result Date: 07/02/2023  Transcranial Doppler Patient Name:  MATEEN VANZEELAND  Date of Exam:   07/01/2023 Medical Rec #: [id number]     Accession #:    [id number] Date of Birth: 08-Apr-1961    Patient Gender: M Patient Age:   63 years Exam Location:  [facility] Procedure:      VAS US  TRANSCRANIAL DOPPLER Referring Phys: DEVON SHAFER --------------------------------------------------------------------------------  Indications: Stroke. History: PAD, right AKA, HTN, HLD, DM, CHF. Limitations: Ventilation, trach collar, body habitus Comparison Study: No prior study Performing Technologist: Carleene Chase RVS  Examination Guidelines: A complete evaluation includes B-mode imaging, spectral Doppler, color Doppler, and power  Doppler as needed of all accessible portions of each vessel. Bilateral testing is considered an integral part of a complete examination. Limited examinations for reoccurring indications may be performed as noted.  +----------+---------------+----------+-----------+----------------------------+ RIGHT TCD Right VM (cm/s)Depth (cm)Pulsatility          Comment            +----------+---------------+----------+-----------+----------------------------+ MCA             24                    1.12     unable to insonate mid and                                                        proximal MCA         +----------+---------------+----------+-----------+----------------------------+ ACA                                                unable to insonate      +----------+---------------+----------+-----------+----------------------------+ Term ICA        34                    1.18                                 +----------+---------------+----------+-----------+----------------------------+ PCA P1          62                    1.05                                 +----------+---------------+----------+-----------+----------------------------+ Opthalmic       32                    1.36                                 +----------+---------------+----------+-----------+----------------------------+ ICA siphon      29                    1.41                                 +----------+---------------+----------+-----------+----------------------------+ Vertebral  unable to insonate      +----------+---------------+----------+-----------+----------------------------+  +----------+--------------+----------+-----------+------------------+ LEFT TCD  Left VM (cm/s)Depth (cm)Pulsatility     Comment       +----------+--------------+----------+-----------+------------------+ MCA             37                   1.23                        +----------+--------------+----------+-----------+------------------+ ACA            -15                   1.36                       +----------+--------------+----------+-----------+------------------+ Term ICA        37                   0.97                       +----------+--------------+----------+-----------+------------------+ PCA P1         -37                   1.43                       +----------+--------------+----------+-----------+------------------+ Opthalmic       35                   1.45                       +----------+--------------+----------+-----------+------------------+ ICA siphon      33                   1.72                       +----------+--------------+----------+-----------+------------------+ Vertebral                                    unable to insonate +----------+--------------+----------+-----------+------------------+  +------------+-------+-------+             VM cm/sComment +------------+-------+-------+ Prox Basilar  -19          +------------+-------+-------+ Summary:  Poor windows throughout limit evaluation. Low normal mean flow velocities in majority of identified vessels of anterior and posterior cerebral circulations..Elevated right posterior cerebral artery mean flow velocities suggest mild stenosis.Globally elevate dpulsatility indices suggest diffuse intracranial atherosclerosis likely. *See table(s) above for TCD measurements and observations.  Diagnosing physician: Ardella Beaver MD Electronically signed by Ardella Beaver MD on 07/02/2023 at 11:46:03 AM.    Final    VAS US  CAROTID (at [facility] and [facility] only) Result Date: 07/02/2023 Carotid Arterial Duplex Study Patient Name:  MARKEVIS MENZEL  Date of Exam:   07/01/2023 Medical Rec #: [id number]     Accession #:    [id number] Date of Birth: 06/22/1960    Patient Gender: M Patient Age:   30 years Exam Location:  [facility] Procedure:      VAS US  CAROTID Referring Phys:  Tona Francis --------------------------------------------------------------------------------  Indications:       CVA. Risk Factors:      Hypertension, hyperlipidemia, Diabetes, current smoker, prior  CVA, PAD. Other Factors:     Right AKA. Limitations        Today's exam was limited due to the neck habitus of the                    patient, bandages, line and patient on a ventilator. Comparison Study:  Prior carotid duplex done 09/19/22 indicating bilateral 1-39%                    ICA stenosis Performing Technologist: Carleene Chase RVS  Examination Guidelines: A complete evaluation includes B-mode imaging, spectral Doppler, color Doppler, and power Doppler as needed of all accessible portions of each vessel. Bilateral testing is considered an integral part of a complete examination. Limited examinations for reoccurring indications may be performed as noted.  Right Carotid Findings: +--------+--------+--------+--------+------------------+--------+         PSV cm/sEDV cm/sStenosisPlaque DescriptionComments +--------+--------+--------+--------+------------------+--------+ ICA Prox                                          tortuous +--------+--------+--------+--------+------------------+--------+ ECA     182     21                                         +--------+--------+--------+--------+------------------+--------+  Left Carotid Findings: +--------+--------+--------+--------+------------------+--------+         PSV cm/sEDV cm/sStenosisPlaque DescriptionComments +--------+--------+--------+--------+------------------+--------+ CCA [id number]      27                                         +--------+--------+--------+--------+------------------+--------+   Summary: Right Carotid: Significantly limited study. Left Carotid: Significantly limited study.  *See table(s) above for measurements and observations.  Electronically signed by Ardella Beaver MD on 07/02/2023 at  11:43:28 AM.    Final    DG FL GUIDED LUMBAR PUNCTURE Result Date: 07/01/2023 CLINICAL DATA:  63 year old male with altered mental status. Lumbar puncture requested. EXAM: LUMBAR PUNCTURE UNDER FLUOROSCOPY PROCEDURE: An appropriate skin entry site was determined fluoroscopically. Operator donned sterile gloves and mask. Skin site was marked, then prepped with Betadine , draped in usual sterile fashion, and infiltrated locally with 1% lidocaine . A 20 gauge spinal needle advancement was attempted at L3-L4, L4-L5, and L5-S1 by this provider as well as additional attempts at L2-L3 and L3-L4 by Dr. Areatha Ku without return of CSF. After multiple unsuccessful attempts the needle was removed. The patient tolerated the procedure well and there were no complications. FLUOROSCOPY: Radiation Exposure Index (as provided by the fluoroscopic device): 105.3 mGy Kerma IMPRESSION: Unsuccessful lumbar puncture under fluoroscopy. This exam was performed by Quintin Buckle [name], and was supervised and interpreted by Lore Rode, MD. Electronically Signed   By: Lore Rode M.D.   On: 07/01/2023 08:14   DG Abd 1 View Result Date: 06/30/2023 CLINICAL DATA:  Ileus. EXAM: ABDOMEN - 1 VIEW COMPARISON:  06/27/2023 FINDINGS: The marked gaseous dilatation of the stomach seen on the previous study has resolved in the interval. No gaseous small bowel or colonic dilatation on the current study. NG tube tip is in the stomach with proximal side port below the GE junction. Rectal temperature probe overlies the low pelvis. IMPRESSION: Interval resolution of  gaseous dilatation of the stomach. No gaseous small bowel or colonic dilatation on the current study. Electronically Signed   By: Donnal Fusi M.D.   On: 06/30/2023 06:34   DG CHEST PORT 1 VIEW Result Date: 06/30/2023 CLINICAL DATA:  63 year old male with respiratory failure, ventilator dependent. EXAM: PORTABLE CHEST 1 VIEW COMPARISON:  Portable chest 06/28/2023 and earlier. FINDINGS:  Portable AP semi upright view at 0421 hours. Endotracheal tube tip in good position between the clavicles and carina. Stable right IJ central line. Enteric tube loops in the stomach. Lung volumes and mediastinal contours are stable and within normal limits. Mildly rotated to the left. Partially visible cervical spine fusion hardware. Regressed bilateral lower lung consolidation, perihilar opacity since 06/27/2023. No areas of worsening ventilation since that time. No pneumothorax or pleural effusion. No pulmonary edema. Paucity of bowel gas.  Stable visualized osseous structures. IMPRESSION: 1.  Stable lines and tubes. 2. Regressed bilateral consolidation/pneumonia since 06/27/2023. Bilateral improved ventilation. No new cardiopulmonary abnormality. Electronically Signed   By: Marlise Simpers M.D.   On: 06/30/2023 05:42   ECHOCARDIOGRAM COMPLETE Result Date: 06/28/2023    ECHOCARDIOGRAM REPORT   Patient Name:   TAMON MCINTYRE Date of Exam: 06/28/2023 Medical Rec #:  [id number]    Height:       72.0 in Accession #:    [id number]   Weight:       212.5 lb Date of Birth:  [birth year]   BSA:          2.186 m Patient Age:    62 years     BP:           101/58 mmHg Patient Gender: M            HR:           90 bpm. Exam Location:  Inpatient Procedure: 2D Echo, Cardiac Doppler and Color Doppler Indications:    Stroke I63.9  History:        Patient has prior history of Echocardiogram examinations, most                 recent 12/06/2022. CHF, COPD, Stroke and CKD, stage 3,                 Signs/Symptoms:Shortness of Breath; Risk Factors:Hypertension,                 Diabetes, Current Smoker and Dyslipidemia.  Sonographer:    Terrilee Few RCS Referring Phys: [id number] ASHISH ARORA IMPRESSIONS  1. Left ventricular ejection fraction, by estimation, is 65 to 70%. The left ventricle has hyperdynamic function. The left ventricle has no regional wall motion abnormalities. Left ventricular diastolic parameters were normal.  2. Right  ventricular systolic function is normal. The right ventricular size is mildly enlarged. Tricuspid regurgitation signal is inadequate for assessing PA pressure.  3. The mitral valve is normal in structure. No evidence of mitral valve regurgitation. No evidence of mitral stenosis.  4. The aortic valve is normal in structure. There is mild calcification of the aortic valve. There is mild thickening of the aortic valve. Aortic valve regurgitation is mild. Aortic valve sclerosis/calcification is present, without any evidence of aortic stenosis.  5. The inferior vena cava is normal in size with <50% respiratory variability, suggesting right atrial pressure of 8 mmHg. FINDINGS  Left Ventricle: Left ventricular ejection fraction, by estimation, is 65 to 70%. The left ventricle has hyperdynamic function. The left ventricle has no regional wall motion abnormalities. The left  ventricular internal cavity size was small. There is no  left ventricular hypertrophy. Left ventricular diastolic parameters were normal. Right Ventricle: The right ventricular size is mildly enlarged. No increase in right ventricular wall thickness. Right ventricular systolic function is normal. Tricuspid regurgitation signal is inadequate for assessing PA pressure. Left Atrium: Left atrial size was normal in size. Right Atrium: Right atrial size was normal in size. Pericardium: There is no evidence of pericardial effusion. Mitral Valve: The mitral valve is normal in structure. No evidence of mitral valve regurgitation. No evidence of mitral valve stenosis. Tricuspid Valve: The tricuspid valve is normal in structure. Tricuspid valve regurgitation is trivial. No evidence of tricuspid stenosis. Aortic Valve: The aortic valve is normal in structure. There is mild calcification of the aortic valve. There is mild thickening of the aortic valve. Aortic valve regurgitation is mild. Aortic valve sclerosis/calcification is present, without any evidence of aortic  stenosis. Aortic valve mean gradient measures 10.0 mmHg. Aortic valve peak gradient measures 20.2 mmHg. Aortic valve area, by VTI measures 2.25 cm. Pulmonic Valve: The pulmonic valve was normal in structure. Pulmonic valve regurgitation is not visualized. No evidence of pulmonic stenosis. Aorta: The aortic root and ascending aorta are structurally normal, with no evidence of dilitation. Venous: The inferior vena cava is normal in size with less than 50% respiratory variability, suggesting right atrial pressure of 8 mmHg. IAS/Shunts: No atrial level shunt detected by color flow Doppler.  LEFT VENTRICLE PLAX 2D LVIDd:         5.00 cm   Diastology LVIDs:         3.60 cm   LV e' medial:    7.72 cm/s LV PW:         1.00 cm   LV E/e' medial:  9.5 LV IVS:        0.80 cm   LV e' lateral:   12.80 cm/s LVOT diam:     2.40 cm   LV E/e' lateral: 5.7 LV SV:         74 LV SV Index:   34 LVOT Area:     4.52 cm  RIGHT VENTRICLE             IVC RV S prime:     14.80 cm/s  IVC diam: 2.80 cm TAPSE (M-mode): 2.9 cm LEFT ATRIUM             Index        RIGHT ATRIUM           Index LA diam:        3.60 cm 1.65 cm/m   RA Area:     15.90 cm LA Vol (A2C):   28.8 ml 13.17 ml/m  RA Volume:   41.60 ml  19.03 ml/m LA Vol (A4C):   20.3 ml 9.29 ml/m LA Biplane Vol: 24.3 ml 11.11 ml/m  AORTIC VALVE AV Area (Vmax):    2.08 cm AV Area (Vmean):   2.16 cm AV Area (VTI):     2.25 cm AV Vmax:           224.50 cm/s AV Vmean:          144.500 cm/s AV VTI:            0.329 m AV Peak Grad:      20.2 mmHg AV Mean Grad:      10.0 mmHg LVOT Vmax:         103.33 cm/s LVOT Vmean:  69.033 cm/s LVOT VTI:          0.163 m LVOT/AV VTI ratio: 0.50  AORTA Ao Root diam: 3.40 cm Ao Asc diam:  3.40 cm MITRAL VALVE               TRICUSPID VALVE MV Area (PHT): 4.21 cm    TR Peak grad:   11.6 mmHg MV Decel Time: 180 msec    TR Vmax:        170.00 cm/s MV E velocity: 73.50 cm/s MV A velocity: 72.70 cm/s  SHUNTS MV E/A ratio:  1.01        Systemic VTI:  0.16  m                            Systemic Diam: 2.40 cm Arta Lark Electronically signed by Arta Lark Signature Date/Time: 06/28/2023/2:33:49 PM    Final    Overnight EEG with video Result Date: 06/28/2023 Arleene Lack, MD     06/29/2023  9:12 AM Patient Name: TRAELYN GENTLEMAN MRN: [id number] Epilepsy Attending: Arleene Lack Referring Physician/Provider: Audrene Lease, NP Duration: 06/27/2023 2356 to 06/29/2023 0200 Patient history: 63yo M with ams getting eeg to evaluate for seizure Level of alertness: comatose AEDs during EEG study: Propofol  Technical aspects: This EEG study was done with scalp electrodes positioned according to the 10-20 International system of electrode placement. Electrical activity was reviewed with band pass filter of 1-70Hz , sensitivity of 7 uV/mm, display speed of 65mm/sec with a 60Hz  notched filter applied as appropriate. EEG data were recorded continuously and digitally stored.  Video monitoring was available and reviewed as appropriate. Description: EEG showed continuous generalized 3 to 6 Hz theta-delta slowing. Hyperventilation and photic stimulation were not performed.   EEG was disconnected between 06/28/2023 1436 to 1639 ABNORMALITY - Continuous slow, generalized IMPRESSION: This study is suggestive of severe diffuse encephalopathy. No seizures or epileptiform discharges were seen throughout the recording. Arleene Lack   DG Chest Port 1 View Result Date: 06/28/2023 CLINICAL DATA:  [id number]. Encounter for pneumonia. Ventilator dependent respiratory failure. EXAM: PORTABLE CHEST 1 VIEW COMPARISON:  Chest CT without contrast yesterday at 7:51 p.m. FINDINGS: 3:56 a.m. ETT tip is 5 cm from the carina, NGT curves around to the left in the stomach with the tip in the upper medial lumen. Right IJ central line tip at the superior cavoatrial junction. Widespread right-greater-than-left airspace disease is again noted, with relative sparing in the left upper lung field. No  pleural effusion is seen. There is mild cardiomegaly, mild central vascular prominence. Stable mediastinum. There is aortic atherosclerosis.  No acute or new osseous findings. IMPRESSION: 1. Widespread right-greater-than-left airspace disease, with relative sparing in the left upper lung field. No new or worsening infiltrate. 2. Mild cardiomegaly with mild central vascular prominence. 3. Support apparatus as above. 4. Aortic atherosclerosis. Electronically Signed   By: Denman Fischer M.D.   On: 06/28/2023 05:58   MR BRAIN WO CONTRAST Result Date: 06/27/2023 CLINICAL DATA:  Initial evaluation for acute neuro deficit, stroke suspected. EXAM: MRI HEAD WITHOUT CONTRAST TECHNIQUE: Multiplanar, multiecho pulse sequences of the brain and surrounding structures were obtained without intravenous contrast. COMPARISON:  Prior study from earlier the same day. FINDINGS: Brain: Age-related cerebral atrophy. Patchy and confluent T2/FLAIR hyperintensity involving the periventricular deep white matter both cerebral hemispheres, consistent with chronic small vessel ischemic disease, moderately advanced. Patchy involvement of the pons. Encephalomalacia and gliosis involving the  right occipital lobe, consistent with a chronic left PCA distribution infarct. Associated chronic hemosiderin staining within this region. Superimposed restricted diffusion within the right temporal occipital region, consistent with an acute on chronic right PCA distribution infarct. Patchy involvement of the right thalamus. No associated hemorrhage or significant regional mass effect. No other evidence for acute or subacute ischemia. No acute intracranial hemorrhage. Chronic microhemorrhage at the anterior right temporal lobe noted. No mass lesion, midline shift or mass effect. No hydrocephalus or extra-axial fluid collection. Pituitary gland suprasellar region within normal limits. Vascular: Major intracranial vascular flow voids are maintained. Skull and  upper cervical spine: Cranial junctional is. Postoperative changes partially visualize within the cervical spine. Bone marrow signal intensity normal. No scalp soft tissue abnormality. Sinuses/Orbits: Globes orbital soft tissues within normal limits. Mucosal thickening noted about the sphenoid ethmoidal and maxillary sinuses. Superimposed air-fluid level within the right maxillary sinus. Small bilateral mastoid effusions. Patient is intubated. Other: None. IMPRESSION: 1. 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 No associated hemorrhage or significant regional mass effect. 2. Underlying age-related cerebral atrophy with moderate chronic microvascular ischemic disease. Electronically Signed   By: Virgia Griffins M.D.   On: 06/27/2023 21:28   CT CHEST ABDOMEN PELVIS WO CONTRAST Result Date: 06/27/2023 CLINICAL DATA:  Pneumonia, sepsis EXAM: CT CHEST, ABDOMEN AND PELVIS WITHOUT CONTRAST TECHNIQUE: Multidetector CT imaging of the chest, abdomen and pelvis was performed following the standard protocol without IV contrast. RADIATION DOSE REDUCTION: This exam was performed according to the departmental dose-optimization program which includes automated exposure control, adjustment of the mA and/or kV according to patient size and/or use of iterative reconstruction technique. COMPARISON:  CT abdomen and pelvis 02/08/2023. FINDINGS: CT CHEST FINDINGS Cardiovascular: Heart is normal size. Aorta is normal caliber. Moderate coronary artery and aortic calcifications. Mediastinum/Nodes: No mediastinal, hilar, or axillary adenopathy. Trachea and esophagus are unremarkable. Endotracheal tube tip in the midtrachea. Thyroid unremarkable. Lungs/Pleura: Dense consolidation in both lower lobes compatible with pneumonia. No effusions. Patchy ground-glass opacities in the upper lobes as well. Musculoskeletal: Chest wall soft tissues are unremarkable. No acute bony abnormality. CT  ABDOMEN PELVIS FINDINGS Hepatobiliary: Small layering gallstones within the gallbladder. No focal hepatic abnormality or biliary ductal dilatation. Pancreas: No focal abnormality or ductal dilatation. Spleen: No focal abnormality.  Normal size. Adrenals/Urinary Tract: Foley catheter in the bladder which is decompressed. No renal or adrenal mass. No stones or hydronephrosis. Stomach/Bowel: Prior colectomy. Right lower quadrant ostomy noted. No bowel obstruction. NG tube is in the stomach. Vascular/Lymphatic: Aortic atherosclerosis. No evidence of aneurysm or adenopathy. Reproductive: Mildly prominent prostate. Other: No free fluid or free air. Musculoskeletal: No acute bony abnormality. IMPRESSION: Dense consolidation in both lower lobes with patchy opacities in the upper lobes. Findings compatible with multifocal pneumonia. Coronary artery disease, aortic atherosclerosis. Right lower quadrant ostomy.  No evidence of bowel obstruction. Cholelithiasis.  No CT evidence for acute cholecystitis. Electronically Signed   By: Janeece Mechanic M.D.   On: 06/27/2023 20:01   DG CHEST PORT 1 VIEW Result Date: 06/27/2023 CLINICAL DATA:  Central line placement. EXAM: PORTABLE CHEST 1 VIEW COMPARISON:  Same day. FINDINGS: Endotracheal and nasogastric tubes are unchanged. Interval placement of right internal jugular catheter with distal tip in expected position of the SVC. No pneumothorax is noted. Stable diffuse right lung interstitial opacity is noted concerning for asymmetric edema or atypical inflammation. IMPRESSION: Interval placement of right internal jugular catheter with distal tip in expected position of the  SVC. Electronically Signed   By: Rosalene Colon M.D.   On: 06/27/2023 12:40   CT HEAD WO CONTRAST ( ) Result Date: 06/27/2023 CLINICAL DATA:  Altered mental status. EXAM: CT HEAD WITHOUT CONTRAST TECHNIQUE: Contiguous axial images were obtained from the base of the skull through the vertex without intravenous  contrast. RADIATION DOSE REDUCTION: This exam was performed according to the departmental dose-optimization program which includes automated exposure control, adjustment of the mA and/or kV according to patient size and/or use of iterative reconstruction technique. COMPARISON:  CT head dated February 08, 2023. FINDINGS: Brain: New loss of the normal gray-white matter differentiation along the inferior right temporal lobe (series 3, image 14; series 5, images 35-42). No evidence of hemorrhage, hydrocephalus, or extra-axial collection. Old right occipital lobe infarct again noted. Vascular: Calcified atherosclerosis at the skull base. No hyperdense vessel. Skull: Normal. Negative for fracture or focal lesion. Sinuses/Orbits: No acute finding. Other: Similar postsurgical changes right pre maxillary soft tissues. IMPRESSION: 1. New loss of the normal gray-white matter differentiation along the inferior right temporal lobe, concerning for acute infarct. No hemorrhage. Consider further evaluation with MRI. 2. Old right occipital lobe infarct. Electronically Signed   By: Aleta Anda M.D.   On: 06/27/2023 11:00   DG Chest Port 1 View Result Date: 06/27/2023 CLINICAL DATA:  Ventilator dependent respiratory failure. [id number] encounter for orogastric tube placement. EXAM: PORTABLE ABDOMEN - 1 VIEW; PORTABLE CHEST - 1 VIEW COMPARISON:  Portable chest today at 6:10 a.m., abdomen and pelvis CT 02/08/2023 FINDINGS: Chest AP portable 6:48 a.m.: Interval intubation with tip of the ETT 4.6 cm from carina. NGT interval insertion. The heart is enlarged. There is worsening central vascular congestion and perihilar edema. There is increased right-greater-than-left perihilar interstitial [name] Cramp most likely due to ground-glass edema. The more peripheral lungs are essentially clear. Small pleural effusions are forming. The mediastinum is stable.  No new osseous abnormality. Flat plate single anteverted view, portable at 6:51 a.m.: NGT  curves to the left and cephalad within the stomach with the tip along the proximal fundus. Compared with CT 02/08/2023 there is severe dilatation of the stomach with air and food products. Correlate clinically for gastric outlet obstruction or impaired gastric emptying. The bowel pattern appears nonobstructive. There is no supine evidence of free air. No other significant radiographic findings. IMPRESSION: 1. Interval intubation with tip of the ETT 4.6 cm from carina. 2. NGT curves to the left and cephalad within the stomach with the tip along the proximal fundus. 3. Compared with CT 02/08/2023 there is severe dilatation of the stomach with air and food products. Correlate clinically for gastric outlet obstruction or impaired gastric emptying. 4. Worsening central vascular congestion and perihilar edema. 5. Developing small pleural effusions. Electronically Signed   By: Denman Fischer M.D.   On: 06/27/2023 07:27   DG Abd Portable 1V Result Date: 06/27/2023 CLINICAL DATA:  Ventilator dependent respiratory failure. [id number] encounter for orogastric tube placement. EXAM: PORTABLE ABDOMEN - 1 VIEW; PORTABLE CHEST - 1 VIEW COMPARISON:  Portable chest today at 6:10 a.m., abdomen and pelvis CT 02/08/2023 FINDINGS: Chest AP portable 6:48 a.m.: Interval intubation with tip of the ETT 4.6 cm from carina. NGT interval insertion. The heart is enlarged. There is worsening central vascular congestion and perihilar edema. There is increased right-greater-than-left perihilar interstitial [name] Cramp most likely due to ground-glass edema. The more peripheral lungs are essentially clear. Small pleural effusions are forming. The mediastinum is stable.  No new osseous abnormality. Flat plate  single anteverted view, portable at 6:51 a.m.: NGT curves to the left and cephalad within the stomach with the tip along the proximal fundus. Compared with CT 02/08/2023 there is severe dilatation of the stomach with air and food products. Correlate  clinically for gastric outlet obstruction or impaired gastric emptying. The bowel pattern appears nonobstructive. There is no supine evidence of free air. No other significant radiographic findings. IMPRESSION: 1. Interval intubation with tip of the ETT 4.6 cm from carina. 2. NGT curves to the left and cephalad within the stomach with the tip along the proximal fundus. 3. Compared with CT 02/08/2023 there is severe dilatation of the stomach with air and food products. Correlate clinically for gastric outlet obstruction or impaired gastric emptying. 4. Worsening central vascular congestion and perihilar edema. 5. Developing small pleural effusions. Electronically Signed   By: Denman Fischer M.D.   On: 06/27/2023 07:27   DG Chest Port 1 View Result Date: 06/27/2023 CLINICAL DATA:  Unresponsive. EXAM: PORTABLE CHEST 1 VIEW COMPARISON:  02/08/2023 FINDINGS: The cardio pericardial silhouette is enlarged. Vascular congestion diffuse noted with diffuse interstitial opacity suggesting edema. No focal consolidation. No pneumothorax or pleural effusion. Gaseous distention of stomach or bowel is seen in the the left hemidiaphragm. Telemetry leads overlie the chest. IMPRESSION: Enlargement of the cardiopericardial silhouette with vascular congestion and diffuse interstitial opacity suggesting edema. Gaseous distention of stomach or bowel noted under the left hemidiaphragm. Electronically Signed   By: Donnal Fusi M.D.   On: 06/27/2023 06:32     PHYSICAL EXAM  Temp:  [99 F (37.2 C)-100.6 F (38.1 C)] 100.6 F (38.1 C) (01/15 1200) Pulse Rate:  [72-132] 87 (01/15 1200) Resp:  [23-37] 29 (01/15 1200) BP: (88-208)/(50-100) 119/66 (01/15 1200) SpO2:  [94 %-100 %] 94 % (01/15 1200) FiO2 (%):  [40 %-50 %] 50 % (01/15 1059) Weight:  [92.8 kg] 92.8 kg (01/15 0411)  General - Well nourished, well developed, intubated on sedation.  Ophthalmologic - fundi not visualized due to noncooperation.  Cardiovascular -  Regular rate and rhythm  Neuro - intubated sedated  ., eyes closed, opens eyes to sternal rub briefly.  Not following commands.  Has spontaneous side-to-side eye movements., not blinking to visual threat, doll's eyes absent, not tracking, PERRL. Corneal reflex positive on the left but absent on the right, gag and cough present. Breathing over the vent.  Facial symmetry not able to test due to ET tube.   On pain stimulation able to move all 4 limbs purposefully upper extremities more than lower extremities.  Sensation, coordination and gait not tested.   ASSESSMENT/PLAN Mr. ZADYN ALBERDA is a 63 y.o. male with history of DM, HTN, HLD, left eye blind, right occipital infarct with left HH, mesenteric ischemia s/p colostomy, RLE AKA, wheelchair bound living in SNF admitted for AMS, obtundation. He was intubated for nausea vomiting and not able to protect airway. Found to have high grade fever and in septic shock. No TNK given due to time onset unclear.    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 Old right occipital lobe infarct.  MRI  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 involving the right temporal occipital region. Transcranial Doppler for history of mild right PCA stenosis and elevated pulsatility and IC suggest diffuse intracranial atherosclerosis  Carotid Doppler highly limited and only both ECAs could be started 2D Echo   ejection fraction 65 to 70% with hyperdynamic LV function.  Left atrium size normal LDL unable to calculate due to elevated triglycerides 521  HgbA1c 6.4 UDS neg Heparin  subq for VTE prophylaxis aspirin  81 mg daily prior to admission, now on aspirin  81 mg daily.  Ongoing aggressive stroke risk factor management Therapy recommendations:   pending Disposition:   as per  daughter, full code for now, palliative care on board  Sepsis/septic shock B/l [place]  UTI CT chest Dense consolidation in both lower lobes with patchy opacities in the upper lobes. Findings compatible with multifocal pneumonia. UA WBC > 50 On zosyn , vanco and azithromycin   Need to rule out meningitis/encephalitis LP failed at the bedside and with IR fluoroscopy as well.  On pressor support CCM on board  Respiratory failure Intubated on vent On sedation and pressor support Vent management per CCM  Hx of stroke 09/2022 subacute right occipital infarct due to right PCA occlusion with severe vasculopathy. MRA occlusion of the distal right P1/proximal right P2 segment, without evidence of reconstitution of the right PCA branches. No DVT, EF 55-60%, A1C 6.9 and THC positive in urine. Put on ASA 81 but no plavix due to GIB and chronic anemia. Also on statin and tricor   Followed at [facility] in 11/[facility]22  Diabetes HgbA1c 6.4 goal < 7.0 Controlled CBG monitoring SSI DM education and close PCP follow up  Hx of hypertension hypotension Low BP on presentation consistent with septic shock On pressure now Stable with pressure Stroke likely due to hypotension in the setting of right PCA occlusion Long term BP goal normotensive  Hyperlipidemia Home meds:  lipitor  80  LDL 29, goal < 70 Now on lipitor  80 Continue statin at discharge  Tobacco abuse Current smoker Smoking cessation counseling will be provided  Other Stroke Risk Factors PVD with RLE AKA Hx of substance abuse - THC  Other Active Problems Left eye blind since 2013 Mesenteric ischemia s/p colostomy 12/2022 with septic shock Full code for now  Hospital day # 6 Patient patient unfortunately has not had much success with trying to weani him of sedation due to poor tolerance and hence neurological exam remains limited due to sedation.  Continue aspirin  alone due to GI bleed with known to dual  antiplatelet therapy.  No family available at the bedside.  No family at the bedside.  Hopefully family meeting with daughter will happen tomorrow.  Family still wants to pursue full aggressive care.  Discussed with Dr. Marygrace Snellen critical care medicine.  Discussed with palliative care team nurse practitioner   This patient is critically ill and at significant risk of neurological worsening, death and care requires constant monitoring of vital signs, hemodynamics,respiratory and cardiac monitoring, extensive review of multiple databases, frequent neurological assessment, discussion with family, other specialists and medical decision making of high complexity.I have made any additions or clarifications directly to the above note.This critical care time does not reflect procedure time, or teaching time or supervisory time of PA/NP/Med Resident etc but could involve care discussion time.  I spent 30 minutes of neurocritical care time  in the care of  this patient.              Ardella Beaver, MD Stroke Neurology 07/03/2023 1:17 PM    To contact Stroke Continuity provider, please refer to [email or web address]. After hours, contact General Neurology

## 2023-07-04 DIAGNOSIS — A419 Sepsis, unspecified organism: Principal | ICD-10-CM

## 2023-07-04 DIAGNOSIS — N1832 Chronic kidney disease, stage 3b: Secondary | ICD-10-CM | POA: Diagnosis not present

## 2023-07-04 DIAGNOSIS — E119 Type 2 diabetes mellitus without complications: Secondary | ICD-10-CM | POA: Diagnosis not present

## 2023-07-04 DIAGNOSIS — I6300 Cerebral infarction due to thrombosis of unspecified precerebral artery: Secondary | ICD-10-CM

## 2023-07-04 DIAGNOSIS — J9601 Acute respiratory failure with hypoxia: Secondary | ICD-10-CM | POA: Diagnosis not present

## 2023-07-04 LAB — GLUCOSE, CAPILLARY
Glucose-Capillary: 173 mg/dL — ABNORMAL HIGH (ref 70–99)
Glucose-Capillary: 177 mg/dL — ABNORMAL HIGH (ref 70–99)
Glucose-Capillary: 180 mg/dL — ABNORMAL HIGH (ref 70–99)
Glucose-Capillary: 181 mg/dL — ABNORMAL HIGH (ref 70–99)
Glucose-Capillary: 185 mg/dL — ABNORMAL HIGH (ref 70–99)
Glucose-Capillary: 216 mg/dL — ABNORMAL HIGH (ref 70–99)
Glucose-Capillary: 236 mg/dL — ABNORMAL HIGH (ref 70–99)

## 2023-07-04 LAB — BASIC METABOLIC PANEL
Anion gap: 11 (ref 5–15)
BUN: 93 mg/dL — ABNORMAL HIGH (ref 8–23)
CO2: 21 mmol/L — ABNORMAL LOW (ref 22–32)
Calcium: 9 mg/dL (ref 8.9–10.3)
Chloride: 115 mmol/L — ABNORMAL HIGH (ref 98–111)
Creatinine, Ser: 1.69 mg/dL — ABNORMAL HIGH (ref 0.61–1.24)
GFR, Estimated: 45 mL/min — ABNORMAL LOW (ref >60)
Glucose, Bld: 205 mg/dL — ABNORMAL HIGH (ref 70–99)
Potassium: 4.9 mmol/L (ref 3.5–5.1)
Sodium: 147 mmol/L — ABNORMAL HIGH (ref 135–145)

## 2023-07-04 LAB — CBC
HCT: 26.6 % — ABNORMAL LOW (ref 39.0–52.0)
Hemoglobin: 8.3 g/dL — ABNORMAL LOW (ref 13.0–17.0)
MCH: 29 pg (ref 26.0–34.0)
MCHC: 31.2 g/dL (ref 30.0–36.0)
MCV: 93 fL (ref 80.0–100.0)
Platelets: 155 10*3/uL (ref 150–400)
RBC: 2.86 MIL/uL — ABNORMAL LOW (ref 4.22–5.81)
RDW: 16.9 % — ABNORMAL HIGH (ref 11.5–15.5)
WBC: 6.2 10*3/uL (ref 4.0–10.5)
nRBC: 0 % (ref 0.0–0.2)

## 2023-07-04 MED ORDER — MIDAZOLAM HCL 2 MG/2ML IJ SOLN
2.0000 mg | INTRAMUSCULAR | Status: DC | PRN
  Administered 2023-07-04: 4 mg via INTRAVENOUS
  Administered 2023-07-05 – 2023-07-06 (×3): 2 mg via INTRAVENOUS
  Filled 2023-07-04: qty 4
  Filled 2023-07-04 (×3): qty 2

## 2023-07-04 MED ORDER — FAMOTIDINE 20 MG PO TABS
20.0000 mg | ORAL_TABLET | Freq: Every day | ORAL | Status: DC
  Administered 2023-07-04 – 2023-07-08 (×5): 20 mg
  Filled 2023-07-04 (×5): qty 1

## 2023-07-04 NOTE — Plan of Care (Signed)
  Problem: Coping: Goal: Ability to adjust to condition or change in health will improve 07/04/2023 1627 by Janese Banks, RN Outcome: Progressing 07/04/2023 1625 by Janese Banks, RN Outcome: Progressing   Problem: Fluid Volume: Goal: Ability to maintain a balanced intake and output will improve 07/04/2023 1627 by Janese Banks, RN Outcome: Progressing 07/04/2023 1625 by Janese Banks, RN Outcome: Progressing   Problem: Health Behavior/Discharge Planning: Goal: Ability to identify and utilize available resources and services will improve Outcome: Progressing Goal: Ability to manage health-related needs will improve 07/04/2023 1627 by Janese Banks, RN Outcome: Progressing 07/04/2023 1625 by Janese Banks, RN Outcome: Progressing   Problem: Skin Integrity: Goal: Risk for impaired skin integrity will decrease Outcome: Progressing   Problem: Tissue Perfusion: Goal: Adequacy of tissue perfusion will improve Outcome: Progressing   Problem: Education: Goal: Knowledge of General Education information will improve Description: Including pain rating scale, medication(s)/side effects and non-pharmacologic comfort measures Outcome: Progressing   Problem: Clinical Measurements: Goal: Will remain free from infection Outcome: Progressing Goal: Diagnostic test results will improve Outcome: Progressing

## 2023-07-04 NOTE — Plan of Care (Signed)
  Problem: Coping: Goal: Ability to adjust to condition or change in health will improve Outcome: Progressing   Problem: Fluid Volume: Goal: Ability to maintain a balanced intake and output will improve Outcome: Progressing   Problem: Health Behavior/Discharge Planning: Goal: Ability to manage health-related needs will improve Outcome: Progressing   Problem: Skin Integrity: Goal: Risk for impaired skin integrity will decrease Outcome: Progressing   Problem: Tissue Perfusion: Goal: Adequacy of tissue perfusion will improve Outcome: Progressing   Problem: Education: Goal: Knowledge of General Education information will improve Description: Including pain rating scale, medication(s)/side effects and non-pharmacologic comfort measures Outcome: Progressing   Problem: Clinical Measurements: Goal: Will remain free from infection Outcome: Progressing Goal: Diagnostic test results will improve Outcome: Progressing

## 2023-07-04 NOTE — Progress Notes (Signed)
STROKE TEAM PROGRESS NOTE   SUBJECTIVE (INTERVAL HISTORY) His RN is at the bedside. Pt remains intubated   but sedation could not be successfully weaned as he became quite restless and tachypneic and his now been switched to Precedex and fentanyl and Versed was discontinued.  Neurological exam remains limited but he can localize and move all 4 extremities to painful stimuli can be aroused but will not follow commands.  Palliative care team is working to arrange family meeting with daughter today  OBJECTIVE Temp:  [97.1 F (36.2 C)-101.1 F (38.4 C)] 97.1 F (36.2 C) (01/16 1123) Pulse Rate:  [54-84] 57 (01/16 1200) Cardiac Rhythm: Normal sinus rhythm;Sinus bradycardia (01/16 1200) Resp:  [8-37] 28 (01/16 1200) BP: (87-135)/(46-72) 115/64 (01/16 1200) SpO2:  [92 %-99 %] 94 % (01/16 1200) FiO2 (%):  [40 %-50 %] 40 % (01/16 1200) Weight:  [85.7 kg] 85.7 kg (01/16 0133)  Recent Labs  Lab 07/03/23 2001 07/03/23 2358 07/04/23 0351 07/04/23 0747 07/04/23 1131  GLUCAP 149* 185* 173* 180* 216*   Recent Labs  Lab 06/29/23 0403 06/30/23 0358 07/01/23 0542 07/02/23 0448 07/03/23 0405 07/04/23 0451  NA 135 140 145 146* 149* 147*  K 4.4 4.5 4.1 4.1 4.7 4.9  CL 105 109 117* 117* 117* 115*  CO2 18* 21* 22 20* 24 21*  GLUCOSE 124* 117* 180* 183* 101* 205*  BUN 55* 52* 47* 50* 69* 93*  CREATININE 2.41* 2.08* 1.61* 1.37* 1.50* 1.69*  CALCIUM 8.4* 9.1 8.6* 9.1 9.1 9.0  MG 2.3 2.2 2.0 1.7 1.9  --   PHOS 4.5 3.9 2.9 2.4* 4.0  --    No results for input(s): "AST", "ALT", "ALKPHOS", "BILITOT", "PROT", "ALBUMIN" in the last 168 hours.  Recent Labs  Lab 06/30/23 0358 07/01/23 0542 07/02/23 0448 07/03/23 0405 07/04/23 0451  WBC 7.3 4.2 5.9 7.5 6.2  NEUTROABS  --   --   --  3.7  --   HGB 8.5* 6.9* 8.6* 8.5* 8.3*  HCT 26.9* 21.3* 26.6* 27.3* 26.6*  MCV 91.2 92.2 89.0 91.3 93.0  PLT 135* 100* 135* 151 155   No results for input(s): "CKTOTAL", "CKMB", "CKMBINDEX", "TROPONINI" in the  last 168 hours. No results for input(s): "LABPROT", "INR" in the last 72 hours.  No results for input(s): "COLORURINE", "LABSPEC", "PHURINE", "GLUCOSEU", "HGBUR", "BILIRUBINUR", "KETONESUR", "PROTEINUR", "UROBILINOGEN", "NITRITE", "LEUKOCYTESUR" in the last 72 hours.  Invalid input(s): "APPERANCEUR"      Component Value Date/Time   CHOL 107 06/29/2023 0403   TRIG 521 (H) 06/29/2023 0403   HDL <10 (L) 06/29/2023 0403   CHOLHDL NOT CALCULATED 06/29/2023 0403   VLDL UNABLE TO CALCULATE IF TRIGLYCERIDE OVER 400 mg/dL 16/03/9603 5409   LDLCALC UNABLE TO CALCULATE IF TRIGLYCERIDE OVER 400 mg/dL 81/19/1478 2956   Lab Results  Component Value Date   HGBA1C 6.4 (H) 06/27/2023      Component Value Date/Time   LABOPIA NONE DETECTED 06/27/2023 0748   COCAINSCRNUR NONE DETECTED 06/27/2023 0748   LABBENZ NONE DETECTED 06/27/2023 0748   AMPHETMU NONE DETECTED 06/27/2023 0748   THCU NONE DETECTED 06/27/2023 0748   LABBARB NONE DETECTED 06/27/2023 0748    No results for input(s): "ETH" in the last 168 hours.  I have personally reviewed the radiological images below and agree with the radiology interpretations.  VAS [place] TRANSCRANIAL DOPPLER Result Date: 07/02/2023  Transcranial Doppler Patient Name:  VERNIS BIAGIONI  Date of Exam:   07/01/2023 Medical Rec #: [id number]     Accession #:  [id number] Date of Birth: 1961-02-12    Patient Gender: M Patient Age:   64 years Exam Location:  [facility] Procedure:      VAS [place] TRANSCRANIAL DOPPLER Referring Phys: Elmer Picker --------------------------------------------------------------------------------  Indications: Stroke. History: PAD, right AKA, HTN, HLD, DM, CHF. Limitations: Ventilation, trach collar, body habitus Comparison Study: No prior study Performing Technologist: Sherren Kerns RVS  Examination Guidelines: A complete evaluation includes B-mode imaging, spectral Doppler, color Doppler, and power Doppler as needed of all accessible portions  of each vessel. Bilateral testing is considered an integral part of a complete examination. Limited examinations for reoccurring indications may be performed as noted.  +----------+---------------+----------+-----------+----------------------------+ RIGHT TCD Right VM (cm/s)Depth (cm)Pulsatility          Comment            +----------+---------------+----------+-----------+----------------------------+ MCA             24                    1.12     unable to insonate mid and                                                        proximal MCA         +----------+---------------+----------+-----------+----------------------------+ ACA                                                unable to insonate      +----------+---------------+----------+-----------+----------------------------+ Term ICA        34                    1.18                                 +----------+---------------+----------+-----------+----------------------------+ PCA P1          62                    1.05                                 +----------+---------------+----------+-----------+----------------------------+ Opthalmic       32                    1.36                                 +----------+---------------+----------+-----------+----------------------------+ ICA siphon      29                    1.41                                 +----------+---------------+----------+-----------+----------------------------+ Vertebral  unable to insonate      +----------+---------------+----------+-----------+----------------------------+  +----------+--------------+----------+-----------+------------------+ LEFT TCD  Left VM (cm/s)Depth (cm)Pulsatility     Comment       +----------+--------------+----------+-----------+------------------+ MCA             37                   1.23                        +----------+--------------+----------+-----------+------------------+ ACA            -15                   1.36                       +----------+--------------+----------+-----------+------------------+ Term ICA        37                   0.97                       +----------+--------------+----------+-----------+------------------+ PCA P1         -37                   1.43                       +----------+--------------+----------+-----------+------------------+ Opthalmic       35                   1.45                       +----------+--------------+----------+-----------+------------------+ ICA siphon      33                   1.72                       +----------+--------------+----------+-----------+------------------+ Vertebral                                    unable to insonate +----------+--------------+----------+-----------+------------------+  +------------+-------+-------+             VM cm/sComment +------------+-------+-------+ Prox Basilar  -19          +------------+-------+-------+ Summary:  Poor windows throughout limit evaluation. Low normal mean flow velocities in majority of identified vessels of anterior and posterior cerebral circulations..Elevated right posterior cerebral artery mean flow velocities suggest mild stenosis.Globally elevate dpulsatility indices suggest diffuse intracranial atherosclerosis likely. *See table(s) above for TCD measurements and observations.  Diagnosing physician: Delia Heady MD Electronically signed by Delia Heady MD on 07/02/2023 at 11:46:03 AM.    Final    VAS US CAROTID (at [facility] and [facility] only) Result Date: 07/02/2023 Carotid Arterial Duplex Study Patient Name:  ZAVIOR SPERLE  Date of Exam:   07/01/2023 Medical Rec #: [id number]     Accession #:    [id number] Date of Birth: [birth year]    Patient Gender: M Patient Age:   68 years Exam Location:  [facility] Procedure:      VAS US CAROTID Referring Phys:  Milon Dikes --------------------------------------------------------------------------------  Indications:       CVA. Risk Factors:      Hypertension, hyperlipidemia, Diabetes, current smoker, prior  CVA, PAD. Other Factors:     Right AKA. Limitations        Today's exam was limited due to the neck habitus of the                    patient, bandages, line and patient on a ventilator. Comparison Study:  Prior carotid duplex done 09/19/22 indicating bilateral 1-39%                    ICA stenosis Performing Technologist: Sherren Kerns RVS  Examination Guidelines: A complete evaluation includes B-mode imaging, spectral Doppler, color Doppler, and power Doppler as needed of all accessible portions of each vessel. Bilateral testing is considered an integral part of a complete examination. Limited examinations for reoccurring indications may be performed as noted.  Right Carotid Findings: +--------+--------+--------+--------+------------------+--------+         PSV cm/sEDV cm/sStenosisPlaque DescriptionComments +--------+--------+--------+--------+------------------+--------+ ICA Prox                                          tortuous +--------+--------+--------+--------+------------------+--------+ ECA     182     21                                         +--------+--------+--------+--------+------------------+--------+  Left Carotid Findings: +--------+--------+--------+--------+------------------+--------+         PSV cm/sEDV cm/sStenosisPlaque DescriptionComments +--------+--------+--------+--------+------------------+--------+ CCA [id number]      27                                         +--------+--------+--------+--------+------------------+--------+   Summary: Right Carotid: Significantly limited study. Left Carotid: Significantly limited study.  *See table(s) above for measurements and observations.  Electronically signed by Delia Heady MD on 07/02/2023 at  11:43:28 AM.    Final    DG FL GUIDED LUMBAR PUNCTURE Result Date: 07/01/2023 CLINICAL DATA:  63 year old male with altered mental status. Lumbar puncture requested. EXAM: LUMBAR PUNCTURE UNDER FLUOROSCOPY PROCEDURE: An appropriate skin entry site was determined fluoroscopically. Operator donned sterile gloves and mask. Skin site was marked, then prepped with Betadine, draped in usual sterile fashion, and infiltrated locally with 1% lidocaine. A 20 gauge spinal needle advancement was attempted at L3-L4, L4-L5, and L5-S1 by this provider as well as additional attempts at L2-L3 and L3-L4 by Dr. Reche Dixon without return of CSF. After multiple unsuccessful attempts the needle was removed. The patient tolerated the procedure well and there were no complications. FLUOROSCOPY: Radiation Exposure Index (as provided by the fluoroscopic device): 105.3 mGy Kerma IMPRESSION: Unsuccessful lumbar puncture under fluoroscopy. This exam was performed by Loyce Dys [name], and was supervised and interpreted by Jeronimo Greaves, MD. Electronically Signed   By: Jeronimo Greaves M.D.   On: 07/01/2023 08:14   DG Abd 1 View Result Date: 06/30/2023 CLINICAL DATA:  Ileus. EXAM: ABDOMEN - 1 VIEW COMPARISON:  06/27/2023 FINDINGS: The marked gaseous dilatation of the stomach seen on the previous study has resolved in the interval. No gaseous small bowel or colonic dilatation on the current study. NG tube tip is in the stomach with proximal side port below the GE junction. Rectal temperature probe overlies the low pelvis. IMPRESSION: Interval resolution of  gaseous dilatation of the stomach. No gaseous small bowel or colonic dilatation on the current study. Electronically Signed   By: Kennith Center M.D.   On: 06/30/2023 06:34   DG CHEST PORT 1 VIEW Result Date: 06/30/2023 CLINICAL DATA:  63 year old male with respiratory failure, ventilator dependent. EXAM: PORTABLE CHEST 1 VIEW COMPARISON:  Portable chest 06/28/2023 and earlier. FINDINGS:  Portable AP semi upright view at 0421 hours. Endotracheal tube tip in good position between the clavicles and carina. Stable right IJ central line. Enteric tube loops in the stomach. Lung volumes and mediastinal contours are stable and within normal limits. Mildly rotated to the left. Partially visible cervical spine fusion hardware. Regressed bilateral lower lung consolidation, perihilar opacity since 06/27/2023. No areas of worsening ventilation since that time. No pneumothorax or pleural effusion. No pulmonary edema. Paucity of bowel gas.  Stable visualized osseous structures. IMPRESSION: 1.  Stable lines and tubes. 2. Regressed bilateral consolidation/pneumonia since 06/27/2023. Bilateral improved ventilation. No new cardiopulmonary abnormality. Electronically Signed   By: Odessa Fleming M.D.   On: 06/30/2023 05:42   ECHOCARDIOGRAM COMPLETE Result Date: 06/28/2023    ECHOCARDIOGRAM REPORT   Patient Name:   ZEYDEN LOUCKS Date of Exam: 06/28/2023 Medical Rec #:  [id number]    Height:       72.0 in Accession #:    [id number]   Weight:       212.5 lb Date of Birth:  12/18/1960   BSA:          2.186 m Patient Age:    62 years     BP:           101/58 mmHg Patient Gender: M            HR:           90 bpm. Exam Location:  Inpatient Procedure: 2D Echo, Cardiac Doppler and Color Doppler Indications:    Stroke I63.9  History:        Patient has prior history of Echocardiogram examinations, most                 recent 12/06/2022. CHF, COPD, Stroke and CKD, stage 3,                 Signs/Symptoms:Shortness of Breath; Risk Factors:Hypertension,                 Diabetes, Current Smoker and Dyslipidemia.  Sonographer:    Lucendia Herrlich RCS Referring Phys: [id number] ASHISH ARORA IMPRESSIONS  1. Left ventricular ejection fraction, by estimation, is 65 to 70%. The left ventricle has hyperdynamic function. The left ventricle has no regional wall motion abnormalities. Left ventricular diastolic parameters were normal.  2. Right  ventricular systolic function is normal. The right ventricular size is mildly enlarged. Tricuspid regurgitation signal is inadequate for assessing PA pressure.  3. The mitral valve is normal in structure. No evidence of mitral valve regurgitation. No evidence of mitral stenosis.  4. The aortic valve is normal in structure. There is mild calcification of the aortic valve. There is mild thickening of the aortic valve. Aortic valve regurgitation is mild. Aortic valve sclerosis/calcification is present, without any evidence of aortic stenosis.  5. The inferior vena cava is normal in size with <50% respiratory variability, suggesting right atrial pressure of 8 mmHg. FINDINGS  Left Ventricle: Left ventricular ejection fraction, by estimation, is 65 to 70%. The left ventricle has hyperdynamic function. The left ventricle has no regional wall motion abnormalities. The left  ventricular internal cavity size was small. There is no  left ventricular hypertrophy. Left ventricular diastolic parameters were normal. Right Ventricle: The right ventricular size is mildly enlarged. No increase in right ventricular wall thickness. Right ventricular systolic function is normal. Tricuspid regurgitation signal is inadequate for assessing PA pressure. Left Atrium: Left atrial size was normal in size. Right Atrium: Right atrial size was normal in size. Pericardium: There is no evidence of pericardial effusion. Mitral Valve: The mitral valve is normal in structure. No evidence of mitral valve regurgitation. No evidence of mitral valve stenosis. Tricuspid Valve: The tricuspid valve is normal in structure. Tricuspid valve regurgitation is trivial. No evidence of tricuspid stenosis. Aortic Valve: The aortic valve is normal in structure. There is mild calcification of the aortic valve. There is mild thickening of the aortic valve. Aortic valve regurgitation is mild. Aortic valve sclerosis/calcification is present, without any evidence of aortic  stenosis. Aortic valve mean gradient measures 10.0 mmHg. Aortic valve peak gradient measures 20.2 mmHg. Aortic valve area, by VTI measures 2.25 cm. Pulmonic Valve: The pulmonic valve was normal in structure. Pulmonic valve regurgitation is not visualized. No evidence of pulmonic stenosis. Aorta: The aortic root and ascending aorta are structurally normal, with no evidence of dilitation. Venous: The inferior vena cava is normal in size with less than 50% respiratory variability, suggesting right atrial pressure of 8 mmHg. IAS/Shunts: No atrial level shunt detected by color flow Doppler.  LEFT VENTRICLE PLAX 2D LVIDd:         5.00 cm   Diastology LVIDs:         3.60 cm   LV e' medial:    7.72 cm/s LV PW:         1.00 cm   LV E/e' medial:  9.5 LV IVS:        0.80 cm   LV e' lateral:   12.80 cm/s LVOT diam:     2.40 cm   LV E/e' lateral: 5.7 LV SV:         74 LV SV Index:   34 LVOT Area:     4.52 cm  RIGHT VENTRICLE             IVC RV S prime:     14.80 cm/s  IVC diam: 2.80 cm TAPSE (M-mode): 2.9 cm LEFT ATRIUM             Index        RIGHT ATRIUM           Index LA diam:        3.60 cm 1.65 cm/m   RA Area:     15.90 cm LA Vol (A2C):   28.8 ml 13.17 ml/m  RA Volume:   41.60 ml  19.03 ml/m LA Vol (A4C):   20.3 ml 9.29 ml/m LA Biplane Vol: 24.3 ml 11.11 ml/m  AORTIC VALVE AV Area (Vmax):    2.08 cm AV Area (Vmean):   2.16 cm AV Area (VTI):     2.25 cm AV Vmax:           224.50 cm/s AV Vmean:          144.500 cm/s AV VTI:            0.329 m AV Peak Grad:      20.2 mmHg AV Mean Grad:      10.0 mmHg LVOT Vmax:         103.33 cm/s LVOT Vmean:  69.033 cm/s LVOT VTI:          0.163 m LVOT/AV VTI ratio: 0.50  AORTA Ao Root diam: 3.40 cm Ao Asc diam:  3.40 cm MITRAL VALVE               TRICUSPID VALVE MV Area (PHT): 4.21 cm    TR Peak grad:   11.6 mmHg MV Decel Time: 180 msec    TR Vmax:        170.00 cm/s MV E velocity: 73.50 cm/s MV A velocity: 72.70 cm/s  SHUNTS MV E/A ratio:  1.01        Systemic VTI:  0.16  m                            Systemic Diam: 2.40 cm Clearnce Hasten Electronically signed by Clearnce Hasten Signature Date/Time: 06/28/2023/2:33:49 PM    Final    Overnight EEG with video Result Date: 06/28/2023 Charlsie Quest, MD     06/29/2023  9:12 AM Patient Name: GLENDLE AUVIL MRN: [id number] Epilepsy Attending: Charlsie Quest Referring Physician/Provider: Lynnae January, NP Duration: 06/27/2023 2356 to 06/29/2023 0200 Patient history: 63yo M with ams getting eeg to evaluate for seizure Level of alertness: comatose AEDs during EEG study: Propofol Technical aspects: This EEG study was done with scalp electrodes positioned according to the 10-20 International system of electrode placement. Electrical activity was reviewed with band pass filter of 1-70Hz , sensitivity of 7 uV/mm, display speed of 58mm/sec with a 60Hz  notched filter applied as appropriate. EEG data were recorded continuously and digitally stored.  Video monitoring was available and reviewed as appropriate. Description: EEG showed continuous generalized 3 to 6 Hz theta-delta slowing. Hyperventilation and photic stimulation were not performed.   EEG was disconnected between 06/28/2023 1436 to 1639 ABNORMALITY - Continuous slow, generalized IMPRESSION: This study is suggestive of severe diffuse encephalopathy. No seizures or epileptiform discharges were seen throughout the recording. Charlsie Quest   DG Chest Port 1 View Result Date: 06/28/2023 CLINICAL DATA:  [id number]. Encounter for pneumonia. Ventilator dependent respiratory failure. EXAM: PORTABLE CHEST 1 VIEW COMPARISON:  Chest CT without contrast yesterday at 7:51 p.m. FINDINGS: 3:56 a.m. ETT tip is 5 cm from the carina, NGT curves around to the left in the stomach with the tip in the upper medial lumen. Right IJ central line tip at the superior cavoatrial junction. Widespread right-greater-than-left airspace disease is again noted, with relative sparing in the left upper lung field. No  pleural effusion is seen. There is mild cardiomegaly, mild central vascular prominence. Stable mediastinum. There is aortic atherosclerosis.  No acute or new osseous findings. IMPRESSION: 1. Widespread right-greater-than-left airspace disease, with relative sparing in the left upper lung field. No new or worsening infiltrate. 2. Mild cardiomegaly with mild central vascular prominence. 3. Support apparatus as above. 4. Aortic atherosclerosis. Electronically Signed   By: Almira Bar M.D.   On: 06/28/2023 05:58   MR BRAIN WO CONTRAST Result Date: 06/27/2023 CLINICAL DATA:  Initial evaluation for acute neuro deficit, stroke suspected. EXAM: MRI HEAD WITHOUT CONTRAST TECHNIQUE: Multiplanar, multiecho pulse sequences of the brain and surrounding structures were obtained without intravenous contrast. COMPARISON:  Prior study from earlier the same day. FINDINGS: Brain: Age-related cerebral atrophy. Patchy and confluent T2/FLAIR hyperintensity involving the periventricular deep white matter both cerebral hemispheres, consistent with chronic small vessel ischemic disease, moderately advanced. Patchy involvement of the pons. Encephalomalacia and gliosis involving the  right occipital lobe, consistent with a chronic left PCA distribution infarct. Associated chronic hemosiderin staining within this region. Superimposed restricted diffusion within the right temporal occipital region, consistent with an acute on chronic right PCA distribution infarct. Patchy involvement of the right thalamus. No associated hemorrhage or significant regional mass effect. No other evidence for acute or subacute ischemia. No acute intracranial hemorrhage. Chronic microhemorrhage at the anterior right temporal lobe noted. No mass lesion, midline shift or mass effect. No hydrocephalus or extra-axial fluid collection. Pituitary gland suprasellar region within normal limits. Vascular: Major intracranial vascular flow voids are maintained. Skull and  upper cervical spine: Cranial junctional is. Postoperative changes partially visualize within the cervical spine. Bone marrow signal intensity normal. No scalp soft tissue abnormality. Sinuses/Orbits: Globes orbital soft tissues within normal limits. Mucosal thickening noted about the sphenoid ethmoidal and maxillary sinuses. Superimposed air-fluid level within the right maxillary sinus. Small bilateral mastoid effusions. Patient is intubated. Other: None. IMPRESSION: 1. 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 No associated hemorrhage or significant regional mass effect. 2. Underlying age-related cerebral atrophy with moderate chronic microvascular ischemic disease. Electronically Signed   By: Rise Mu M.D.   On: 06/27/2023 21:28   CT CHEST ABDOMEN PELVIS WO CONTRAST Result Date: 06/27/2023 CLINICAL DATA:  Pneumonia, sepsis EXAM: CT CHEST, ABDOMEN AND PELVIS WITHOUT CONTRAST TECHNIQUE: Multidetector CT imaging of the chest, abdomen and pelvis was performed following the standard protocol without IV contrast. RADIATION DOSE REDUCTION: This exam was performed according to the departmental dose-optimization program which includes automated exposure control, adjustment of the mA and/or kV according to patient size and/or use of iterative reconstruction technique. COMPARISON:  CT abdomen and pelvis 02/08/2023. FINDINGS: CT CHEST FINDINGS Cardiovascular: Heart is normal size. Aorta is normal caliber. Moderate coronary artery and aortic calcifications. Mediastinum/Nodes: No mediastinal, hilar, or axillary adenopathy. Trachea and esophagus are unremarkable. Endotracheal tube tip in the midtrachea. Thyroid unremarkable. Lungs/Pleura: Dense consolidation in both lower lobes compatible with pneumonia. No effusions. Patchy ground-glass opacities in the upper lobes as well. Musculoskeletal: Chest wall soft tissues are unremarkable. No acute bony abnormality. CT  ABDOMEN PELVIS FINDINGS Hepatobiliary: Small layering gallstones within the gallbladder. No focal hepatic abnormality or biliary ductal dilatation. Pancreas: No focal abnormality or ductal dilatation. Spleen: No focal abnormality.  Normal size. Adrenals/Urinary Tract: Foley catheter in the bladder which is decompressed. No renal or adrenal mass. No stones or hydronephrosis. Stomach/Bowel: Prior colectomy. Right lower quadrant ostomy noted. No bowel obstruction. NG tube is in the stomach. Vascular/Lymphatic: Aortic atherosclerosis. No evidence of aneurysm or adenopathy. Reproductive: Mildly prominent prostate. Other: No free fluid or free air. Musculoskeletal: No acute bony abnormality. IMPRESSION: Dense consolidation in both lower lobes with patchy opacities in the upper lobes. Findings compatible with multifocal pneumonia. Coronary artery disease, aortic atherosclerosis. Right lower quadrant ostomy.  No evidence of bowel obstruction. Cholelithiasis.  No CT evidence for acute cholecystitis. Electronically Signed   By: Charlett Nose M.D.   On: 06/27/2023 20:01   DG CHEST PORT 1 VIEW Result Date: 06/27/2023 CLINICAL DATA:  Central line placement. EXAM: PORTABLE CHEST 1 VIEW COMPARISON:  Same day. FINDINGS: Endotracheal and nasogastric tubes are unchanged. Interval placement of right internal jugular catheter with distal tip in expected position of the SVC. No pneumothorax is noted. Stable diffuse right lung interstitial opacity is noted concerning for asymmetric edema or atypical inflammation. IMPRESSION: Interval placement of right internal jugular catheter with distal tip in expected position of the  SVC. Electronically Signed   By: Lupita Raider M.D.   On: 06/27/2023 12:40   CT HEAD WO CONTRAST ( ) Result Date: 06/27/2023 CLINICAL DATA:  Altered mental status. EXAM: CT HEAD WITHOUT CONTRAST TECHNIQUE: Contiguous axial images were obtained from the base of the skull through the vertex without intravenous  contrast. RADIATION DOSE REDUCTION: This exam was performed according to the departmental dose-optimization program which includes automated exposure control, adjustment of the mA and/or kV according to patient size and/or use of iterative reconstruction technique. COMPARISON:  CT head dated February 08, 2023. FINDINGS: Brain: New loss of the normal gray-white matter differentiation along the inferior right temporal lobe (series 3, image 14; series 5, images 35-42). No evidence of hemorrhage, hydrocephalus, or extra-axial collection. Old right occipital lobe infarct again noted. Vascular: Calcified atherosclerosis at the skull base. No hyperdense vessel. Skull: Normal. Negative for fracture or focal lesion. Sinuses/Orbits: No acute finding. Other: Similar postsurgical changes right pre maxillary soft tissues. IMPRESSION: 1. New loss of the normal gray-white matter differentiation along the inferior right temporal lobe, concerning for acute infarct. No hemorrhage. Consider further evaluation with MRI. 2. Old right occipital lobe infarct. Electronically Signed   By: Obie Dredge M.D.   On: 06/27/2023 11:00   DG Chest Port 1 View Result Date: 06/27/2023 CLINICAL DATA:  Ventilator dependent respiratory failure. [id number] encounter for orogastric tube placement. EXAM: PORTABLE ABDOMEN - 1 VIEW; PORTABLE CHEST - 1 VIEW COMPARISON:  Portable chest today at 6:10 a.m., abdomen and pelvis CT 02/08/2023 FINDINGS: Chest AP portable 6:48 a.m.: Interval intubation with tip of the ETT 4.6 cm from carina. NGT interval insertion. The heart is enlarged. There is worsening central vascular congestion and perihilar edema. There is increased right-greater-than-left perihilar interstitial Madilyn Fireman most likely due to ground-glass edema. The more peripheral lungs are essentially clear. Small pleural effusions are forming. The mediastinum is stable.  No new osseous abnormality. Flat plate single anteverted view, portable at 6:51 a.m.: NGT  curves to the left and cephalad within the stomach with the tip along the proximal fundus. Compared with CT 02/08/2023 there is severe dilatation of the stomach with air and food products. Correlate clinically for gastric outlet obstruction or impaired gastric emptying. The bowel pattern appears nonobstructive. There is no supine evidence of free air. No other significant radiographic findings. IMPRESSION: 1. Interval intubation with tip of the ETT 4.6 cm from carina. 2. NGT curves to the left and cephalad within the stomach with the tip along the proximal fundus. 3. Compared with CT 02/08/2023 there is severe dilatation of the stomach with air and food products. Correlate clinically for gastric outlet obstruction or impaired gastric emptying. 4. Worsening central vascular congestion and perihilar edema. 5. Developing small pleural effusions. Electronically Signed   By: Almira Bar M.D.   On: 06/27/2023 07:27   DG Abd Portable 1V Result Date: 06/27/2023 CLINICAL DATA:  Ventilator dependent respiratory failure. [id number] encounter for orogastric tube placement. EXAM: PORTABLE ABDOMEN - 1 VIEW; PORTABLE CHEST - 1 VIEW COMPARISON:  Portable chest today at 6:10 a.m., abdomen and pelvis CT 02/08/2023 FINDINGS: Chest AP portable 6:48 a.m.: Interval intubation with tip of the ETT 4.6 cm from carina. NGT interval insertion. The heart is enlarged. There is worsening central vascular congestion and perihilar edema. There is increased right-greater-than-left perihilar interstitial Madilyn Fireman most likely due to ground-glass edema. The more peripheral lungs are essentially clear. Small pleural effusions are forming. The mediastinum is stable.  No new osseous abnormality. Flat plate  single anteverted view, portable at 6:51 a.m.: NGT curves to the left and cephalad within the stomach with the tip along the proximal fundus. Compared with CT 02/08/2023 there is severe dilatation of the stomach with air and food products. Correlate  clinically for gastric outlet obstruction or impaired gastric emptying. The bowel pattern appears nonobstructive. There is no supine evidence of free air. No other significant radiographic findings. IMPRESSION: 1. Interval intubation with tip of the ETT 4.6 cm from carina. 2. NGT curves to the left and cephalad within the stomach with the tip along the proximal fundus. 3. Compared with CT 02/08/2023 there is severe dilatation of the stomach with air and food products. Correlate clinically for gastric outlet obstruction or impaired gastric emptying. 4. Worsening central vascular congestion and perihilar edema. 5. Developing small pleural effusions. Electronically Signed   By: Almira Bar M.D.   On: 06/27/2023 07:27   DG Chest Port 1 View Result Date: 06/27/2023 CLINICAL DATA:  Unresponsive. EXAM: PORTABLE CHEST 1 VIEW COMPARISON:  02/08/2023 FINDINGS: The cardio pericardial silhouette is enlarged. Vascular congestion diffuse noted with diffuse interstitial opacity suggesting edema. No focal consolidation. No pneumothorax or pleural effusion. Gaseous distention of stomach or bowel is seen in the the left hemidiaphragm. Telemetry leads overlie the chest. IMPRESSION: Enlargement of the cardiopericardial silhouette with vascular congestion and diffuse interstitial opacity suggesting edema. Gaseous distention of stomach or bowel noted under the left hemidiaphragm. Electronically Signed   By: Kennith Center M.D.   On: 06/27/2023 06:32     PHYSICAL EXAM  Temp:  [97.1 F (36.2 C)-101.1 F (38.4 C)] 97.1 F (36.2 C) (01/16 1123) Pulse Rate:  [54-84] 57 (01/16 1200) Resp:  [8-37] 28 (01/16 1200) BP: (87-135)/(46-72) 115/64 (01/16 1200) SpO2:  [92 %-99 %] 94 % (01/16 1200) FiO2 (%):  [40 %-50 %] 40 % (01/16 1200) Weight:  [85.7 kg] 85.7 kg (01/16 0133)  General - Well nourished, well developed, intubated on sedation.  Ophthalmologic - fundi not visualized due to noncooperation.  Cardiovascular - Regular  rate and rhythm  Neuro - intubated sedated  ., eyes closed, opens eyes to sternal rub briefly.  Not following commands.  Has spontaneous side-to-side eye movements., not blinking to visual threat, doll's eyes absent, not tracking, PERRL. Corneal reflex positive on the left but absent on the right, gag and cough present. Breathing over the vent.  Facial symmetry not able to test due to ET tube.   On pain stimulation able to move all 4 limbs purposefully upper extremities more than lower extremities.  Sensation, coordination and gait not tested.   ASSESSMENT/PLAN Mr. TYGER HAWKES is a 63 y.o. male with history of DM, HTN, HLD, left eye blind, right occipital infarct with left HH, mesenteric ischemia s/p colostomy, RLE AKA, wheelchair bound living in SNF admitted for AMS, obtundation. He was intubated for nausea vomiting and not able to protect airway. Found to have high grade fever and in septic shock. No TNK given due to time onset unclear.    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 Old right occipital lobe infarct.  MRI  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 involving the right temporal occipital region. Transcranial Doppler for history of mild right PCA stenosis and elevated pulsatility and IC suggest diffuse intracranial atherosclerosis  Carotid Doppler highly limited and only both ECAs could be started 2D Echo   ejection fraction 65 to 70% with hyperdynamic LV function.  Left atrium size normal LDL unable to calculate due to elevated triglycerides 521  HgbA1c 6.4 UDS neg Heparin subq for VTE prophylaxis aspirin 81 mg daily prior to admission, now on aspirin 81 mg daily.  Ongoing aggressive stroke risk factor management Therapy recommendations:   pending Disposition:   as per  daughter, full code for now, palliative care on board  Sepsis/septic shock B/l [place]  UTI CT chest Dense consolidation in both lower lobes with patchy opacities in the upper lobes. Findings compatible with multifocal pneumonia. UA WBC > 50 On zosyn, vanco and azithromycin  Need to rule out meningitis/encephalitis LP failed at the bedside and with IR fluoroscopy as well.  On pressor support CCM on board  Respiratory failure Intubated on vent On sedation and pressor support Vent management per CCM  Hx of stroke 09/2022 subacute right occipital infarct due to right PCA occlusion with severe vasculopathy. MRA occlusion of the distal right P1/proximal right P2 segment, without evidence of reconstitution of the right PCA branches. No DVT, EF 55-60%, A1C 6.9 and THC positive in urine. Put on ASA 81 but no plavix due to GIB and chronic anemia. Also on statin and tricor  Followed at [facility] in 11/2022  Diabetes HgbA1c 6.4 goal < 7.0 Controlled CBG monitoring SSI DM education and close PCP follow up  Hx of hypertension hypotension Low BP on presentation consistent with septic shock On pressure now Stable with pressure Stroke likely due to hypotension in the setting of right PCA occlusion Long term BP goal normotensive  Hyperlipidemia Home meds:  lipitor 80  LDL 29, goal < 70 Now on lipitor 80 Continue statin at discharge  Tobacco abuse Current smoker Smoking cessation counseling will be provided  Other Stroke Risk Factors PVD with RLE AKA Hx of substance abuse - THC  Other Active Problems Left eye blind since 2013 Mesenteric ischemia s/p colostomy 12/2022 with septic shock Full code for now  Hospital day # 7 Patient patient unfortunately has not had much success with trying to wean CT scan him of sedation due to poor tolerance and hence neurological exam remains limited due to sedation.  Continue aspirin alone due to GI bleed with known to  dual antiplatelet therapy.  No family available at the bedside.  No family at the bedside.  Hopefully family meeting with daughter will happen tomorrow.  Family still wants to pursue full aggressive care.  Discussed with Dr. Judeth Horn critical care medicine.  Discussed with palliative care team nurse practitioner .meeting with daughter later today   This patient is critically ill and at significant risk of neurological worsening, death and care requires constant monitoring of vital signs, hemodynamics,respiratory and cardiac monitoring, extensive review of multiple databases, frequent neurological assessment, discussion with family, other specialists and medical decision making of high complexity.I have made any additions or clarifications directly to the above note.This critical care time does not reflect procedure time, or teaching time or supervisory time of PA/NP/Med Resident etc but could involve care discussion time.  I spent 30 minutes of neurocritical care time  in the care of  this patient.                 Delia Heady, MD Stroke Neurology 07/04/2023 1:26 PM    To contact Stroke Continuity provider, please refer to [email or web address]. After hours, contact General Neurology

## 2023-07-04 NOTE — Progress Notes (Signed)
Update:  Spoke with patient's daughter, Francesca Oman, about goals of care and updates regarding her father. We have increased his oral sedation and pain medications and have weaned off midazolam, while starting precedex gtt. We have been treating his pneumonia with cefepime and his vent settings remains minimal. His mentation/encephalopathy is what is precluding extubation.   We discussed what next steps would look like. [name] shares that her father has been hospitalized with critical illnesses many times before (COVID, pneumonia, ischemic bowel, and for his AKA). He has been intubated before and she shares that he has always bounced back. He did require a trach during one hospitalization, but she notes that it was removed a few days later, as he clinically improved. Her hopefulness stems from seeing him recover during many previous hospitalizations.   [name] wants to give her father more time to improve neurologically, as she believes he is making improvements now, compared to when she last saw him 3 days ago. On my exam, the patient will withdrawal all extremities to pain, but does not follow any commands. [name] states that the patient would want a trach/peg IF he had a chance a neurologic recovery, but would not want to be trach/peg dependent for the rest of his life if he were not to recover. She would like to give him a few more days so she can make a decision re: trach or trialing one way extubation / considering palliative at that time.   Elza Rafter, DO Internal Medicine Resident, PGY-3

## 2023-07-04 NOTE — Progress Notes (Signed)
NAME:  Gregory [name], MRN:  [id number], DOB:  12/18/1960, LOS: 7 ADMISSION DATE:  06/27/2023, CONSULTATION DATE:  06/27/23 REFERRING MD:  EDP, CHIEF COMPLAINT:  AMS   History of Present Illness:  63 year old male with significant past medical history including diabetes, hypertension, hyperlipidemia, CKDIIIb, COPD, chronic osteomyelitis s/p R AKA, L midfoot amputation, atrial fibrillation, HFpEF, mesenteric ischemia s/p colostomy 12/2022 who presented to ED from SNF for altered mental status/obtunded. Reported increased liquid stool from ostomy. He was subsequently intubated in the ED. Labs in ED notable for K 5.8, Cr 2.6, WBC 13, trop 83>705, BNP 63, UA large leuk, wbc21-50 few bacteria, UDS negative. Admitted to PCCM.   Pertinent  Medical History  COPD, CKD3b, mesenteric ischemia s/p colostomy 12/2022, T2DM, HFpEF, HTN, HLD   Significant Hospital Events: Including procedures, antibiotic start and stop dates in addition to other pertinent events   1/9: admit from ED for AMS, intubated, CVC 1/10: MRI w/ 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 Stroke team/neuro consulted. Recommend stroke work up, d/c meningitis/encephalitis coverage, palliative consult.  1/10 unsuccessful attempts at lumbar puncture 1/12 significant ventilator dyssynchrony, given 1 dose of Nimbex, on Versed drip 1/13 agitated overnight despite fentanyl and versed pushes and Haldol 1/14 remains agitated with sedation wean   Interim History / Subjective:  Remains agitated with attempted sedation weaning   Objective   Blood pressure 117/70, pulse 61, temperature 97.8 F (36.6 C), temperature source Axillary, resp. rate (!) 28, height 6' 0.01" (1.829 m), weight 85.7 kg, SpO2 97%.    Vent Mode: PRVC FiO2 (%):  [40 %-50 %] 40 % Set Rate:  [28 bmp] 28 bmp Vt Set:  [600 mL] 600 mL PEEP:  [5 cmH20] 5 cmH20 Pressure Support:  [16 cmH20] 16 cmH20 Plateau Pressure:  [17 cmH20-26 cmH20] 20 cmH20    Intake/Output Summary (Last 24 hours) at 07/04/2023 6213 Last data filed at 07/04/2023 0500 Gross per 24 hour  Intake 2369.69 ml  Output 1350 ml  Net 1019.69 ml   Filed Weights   07/02/23 0500 07/03/23 0411 07/04/23 0133  Weight: 92.8 kg 92.8 kg 85.7 kg    Examination: General: chronically ill appearing, remains sedated on vent HENT: OG tube, ET tube in place Lungs: coarse breath sounds bilaterally Cardiovascular: regular rate, rhythm Abdomen: colostomy present w stool output, soft abdomen Extremities: R AKA, L midfoot amputation; left leg with edema and scabs Neuro: sedated  GU: foley in place  Hb 8.3 Plt 155 Na 147 Cr 1.5>1.69 CBGs 149-205   Resolved Hospital Problem list   Thrombocytopenia  Assessment & Plan:  Septic shock 2/2 ESBL UTI and pneumonia Rhinovirus positive - off pressors - continue cefepime day 4/7 for lower resp tract culture - completed course of meropenem for urine - trach aspirate growing klebsiella and serratia   Acute hypoxemic respiratory failure 2/2 multifocal pneumonia COPD Vent settings: PRVC, RR 28, TV 600, PEEP 5, FiO2 40%  - cefepime as above - continue to wean sedation - Encephalopathy continues to preclude PSV - VAP protocol, stress ulcer prophylaxis  - Brovana, Pulmicort, Yupelri  Fever - Tmax 101.70F yesterday - removed foley yesterday - remove central line today and replace with 2 peripheral IV   Encephalopathy/Agitation - LP unsuccessful - Weaning versed gtt as able - Continue fentanyl gtt - Continue precedex, up-titrate as needed - Continue seroquel 100 mg at bedtime, klonopin 2 mg TID, and lyrica 125 mg TID   Chronic pain - On oxycontin 15 mg  BID and oxycodone 5 mg q6h prn at home - continue oxycodone to 20 mg q6h while weaning sedation  - suspect opioid tolerance is affecting ability to wean sedation   AKI on CKD IIIb - Cr slightly worsened at 1.69 - 1.25 L UOP in last 24 hrs - maintain renal perfusion - avoid  nephrotoxic meds - replete electrolytes prn - bladder scans; replace foley if retaining   Hypernatremia - Still has 2.1 L free water deficit - continue free water to 150 mL q2h   Normocytic anemia - Hb stable at 8.3 (1u prbc on 1/13) - [place] Hb >7  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  Order written to d/c  Foley:  Yes, and it is no longer needed Code Status:  full code Last date of multidisciplinary goals of care discussion [daughter, [name], updated at bedside 1/13; will call today]  Labs   CBC: Recent Labs  Lab 06/30/23 0358 07/01/23 0542 07/02/23 0448 07/03/23 0405 07/04/23 0451  WBC 7.3 4.2 5.9 7.5 6.2  NEUTROABS  --   --   --  3.7  --   HGB 8.5* 6.9* 8.6* 8.5* 8.3*  HCT 26.9* 21.3* 26.6* 27.3* 26.6*  MCV 91.2 92.2 89.0 91.3 93.0  PLT 135* 100* 135* 151 155    Basic Metabolic Panel: Recent Labs  Lab 06/29/23 0403 06/30/23 0358 07/01/23 0542 07/02/23 0448 07/03/23 0405 07/04/23 0451  NA 135 140 145 146* 149* 147*  K 4.4 4.5 4.1 4.1 4.7 4.9  CL 105 109 117* 117* 117* 115*  CO2 18* 21* 22 20* 24 21*  GLUCOSE 124* 117* 180* 183* 101* 205*  BUN 55* 52* 47* 50* 69* 93*  CREATININE 2.41* 2.08* 1.61* 1.37* 1.50* 1.69*  CALCIUM 8.4* 9.1 8.6* 9.1 9.1 9.0  MG 2.3 2.2 2.0 1.7 1.9  --   PHOS 4.5 3.9 2.9 2.4* 4.0  --    GFR: Estimated Creatinine Clearance: 48.5 mL/min (A) (by C-G formula based on SCr of 1.69 mg/dL  (H)). Recent Labs  Lab 06/27/23 0813 06/27/23 0824 06/28/23 0305 07/01/23 0542 07/02/23 0448 07/03/23 0405 07/04/23 0451  PROCALCITON 1.65  --   --   --   --   --   --   WBC  --   --    < > 4.2 5.9 7.5 6.2  LATICACIDVEN  --  0.9  --   --   --   --   --    < > = values in this interval not displayed.    Liver Function Tests: No results for input(s): "AST", "ALT", "ALKPHOS", "BILITOT", "PROT", "ALBUMIN" in the last 168 hours.  No results for input(s): "LIPASE", "AMYLASE" in the last 168 hours. Recent Labs  Lab 06/27/23 0925  AMMONIA 30    ABG    Component Value Date/Time   PHART 7.211 (L) 06/28/2023 0803   PCO2ART 51.2 (H) 06/28/2023 9528  PO2ART 99 06/28/2023 0803   HCO3 20.4 06/28/2023 0803   TCO2 22 06/28/2023 0803   ACIDBASEDEF 7.0 (H) 06/28/2023 0803   O2SAT 96 06/28/2023 0803     Coagulation Profile: No results for input(s): "INR", "PROTIME" in the last 168 hours.   Cardiac Enzymes: No results for input(s): "CKTOTAL", "CKMB", "CKMBINDEX", "TROPONINI" in the last 168 hours.  HbA1C: Hemoglobin A1C  Date/Time Value Ref Range Status  08/14/2016 12:00 AM 5.6  Final  01/18/2016 12:00 AM 5.5  Final   Hgb A1c MFr Bld  Date/Time Value Ref Range Status  06/27/2023 09:25 AM 6.4 (H) 4.8 - 5.6 % Final    Comment:    (NOTE) Pre diabetes:          5.7%-6.4%  Diabetes:              >6.4%  Glycemic control for   <7.0% adults with diabetes   12/03/2022 04:54 PM 6.9 (H) 4.8 - 5.6 % Final    Comment:    (NOTE) Pre diabetes:          5.7%-6.4%  Diabetes:              >6.4%  Glycemic control for   <7.0% adults with diabetes     CBG: Recent Labs  Lab 07/03/23 1127 07/03/23 1551 07/03/23 2001 07/03/23 2358 07/04/23 0351  GLUCAP 169* 152* 149* 185* 173*    Review of Systems:   Unable to obtain  Past Medical History:  He,  has a past medical history of Acquired absence of left great toe (HCC), Acquired absence of right leg below knee (HCC), Acute  osteomyelitis of calcaneum, right (HCC) (10/02/2016), Acute respiratory failure (HCC), Amputation stump infection (HCC) (08/12/2018), Anemia, Basal cell carcinoma, eyelid, Cancer (HCC), Candidiasis (08/12/2018), CHF (congestive heart failure) (HCC), Chronic back pain, Chronic kidney disease, Chronic multifocal osteomyelitis (HCC), Chronic pain syndrome, COPD (chronic obstructive pulmonary disease) (HCC), DDD (degenerative disc disease), lumbar, Depression, Diabetes mellitus without complication (HCC), Diabetic ulcer of toe of left foot (HCC) (10/02/2016), Difficult intubation, Difficulty in walking, not elsewhere classified, 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), Foot drop, bilateral, Foot drop, right (12/27/2015), GERD (gastroesophageal reflux disease), Herpesviral vesicular dermatitis, History of COVID-19 (03/15/2022), Hyperlipidemia, Hyperlipidemia, Hypertension, Insomnia, Malignant melanoma of other parts of face (HCC), Melanoma (HCC), MRSA bacteremia, Muscle weakness (generalized), Nasal congestion, Necrosis of toe (HCC) (07/08/2018), Neuromuscular dysfunction of bladder, Osteomyelitis (HCC), Osteomyelitis (HCC), Osteomyelitis (HCC), Other idiopathic peripheral autonomic neuropathy, Retention of urine, unspecified, Squamous cell carcinoma of skin, Stroke (HCC), Unsteadiness on feet, Urinary retention, Urinary retention, Urinary tract infection, Wears dentures, and Wears glasses.   Surgical History:   Past Surgical History:  Procedure Laterality Date   AMPUTATION Left 03/29/2017   Procedure: LEFT GREAT TOE AMPUTATION AT METATARSOPHALANGEAL JOINT;  Surgeon: Nadara Mustard, MD;  Location: [facility] OR;  Service: Orthopedics;  Laterality: Left;   AMPUTATION Right 03/29/2017   Procedure: RIGHT BELOW KNEE AMPUTATION;  Surgeon: Nadara Mustard, MD;  Location: [facility] OR;  Service: Orthopedics;  Laterality: Right;   AMPUTATION Left 06/06/2018   Procedure: LEFT 2ND TOE AMPUTATION;  Surgeon: Nadara Mustard, MD;  Location: [facility] OR;   Service: Orthopedics;  Laterality: Left;   AMPUTATION Right 11/19/2018   Procedure: AMPUTATION ABOVE KNEE;  Surgeon: Nadara Mustard, MD;  Location: [facility] OR;  Service: Orthopedics;  Laterality: Right;   ANTERIOR CERVICAL CORPECTOMY N/A 11/25/2015   Procedure: ANTERIOR CERVICAL FIVE CORPECTOMY Cervical four - six fusion;  Surgeon: Lisbeth Renshaw, MD;  Location: [facility]  NEURO ORS;  Service: Neurosurgery;  Laterality: N/A;  ANTERIOR CERVICAL FIVE CORPECTOMY Cervical four - six fusion   APPLICATION OF WOUND VAC Right 10/22/2018   Procedure: Application Of Wound Vac;  Surgeon: Nadara Mustard, MD;  Location: [facility] OR;  Service: Orthopedics;  Laterality: Right;   CYSTOSCOPY WITH BIOPSY N/A 05/01/2022   Procedure: CYSTOSCOPY WITH URETHRAL BIOPSY;  Surgeon: Noel Christmas, MD;  Location: [facility] ORS;  Service: Urology;  Laterality: N/A;  1 HR   CYSTOSCOPY WITH RETROGRADE URETHROGRAM N/A 04/25/2018   Procedure: CYSTOSCOPY WITH RETROGRADE URETHROGRAM/ BALLOON DILATION;  Surgeon: Ihor Gully, MD;  Location: [facility] ORS;  Service: Urology;  Laterality: N/A;   CYSTOSCOPY WITH URETHRAL DILATATION N/A 05/01/2022   Procedure: BALLOON DILATION WITH  OPTILUME;  Surgeon: Noel Christmas, MD;  Location: [facility] ORS;  Service: Urology;  Laterality: N/A;   LAPAROTOMY N/A 12/03/2022   Procedure: EXPLORATORY LAPAROTOMY  total abdominal colectomy with end ileostomy;  Surgeon: Kinsinger, De Blanch, MD;  Location: [facility] OR;  Service: General;  Laterality: N/A;   MULTIPLE TOOTH EXTRACTIONS     POSTERIOR CERVICAL FUSION/FORAMINOTOMY N/A 11/29/2015   Procedure: Cervical Three-Cervical Seven Posterior Cervical Laminectomy with Fusion;  Surgeon: Lisbeth Renshaw, MD;  Location: [facility] NEURO ORS;  Service: Neurosurgery;  Laterality: N/A;  Cervical Three-Cervical Seven Posterior Cervical Laminectomy with Fusion   STUMP REVISION Right 10/22/2018   Procedure: REVISION RIGHT BELOW KNEE AMPUTATION;  Surgeon: Nadara Mustard, MD;  Location: [facility] OR;  Service:  Orthopedics;  Laterality: Right;   STUMP REVISION Right 12/05/2018   Procedure: Revision Right Above Knee Amputation;  Surgeon: Nadara Mustard, MD;  Location: [facility] OR;  Service: Orthopedics;  Laterality: Right;   STUMP REVISION Right 05/29/2019   Procedure: REVISION RIGHT ABOVE KNEE AMPUTATION;  Surgeon: Nadara Mustard, MD;  Location: [facility] OR;  Service: Orthopedics;  Laterality: Right;   STUMP REVISION Right 06/24/2019   Procedure: STUMP REVISION;  Surgeon: Nadara Mustard, MD;  Location: [facility] OR;  Service: Orthopedics;  Laterality: Right;     Social History:   reports that he has been smoking cigarettes. He has never used smokeless tobacco. He reports that he does not drink alcohol and does not use drugs.   Family History:  His family history includes Bone cancer in his father; Cancer in his father, mother, paternal grandfather, and another family member; Diabetes in his mother and another family member; Heart disease in his mother; Prostate cancer in his father.   Allergies Allergies  Allergen Reactions   Latex Other (See Comments)    "ALLERGIC," per [facility]     Home Medications  Prior to Admission medications   Medication Sig Start Date End Date Taking? Authorizing Provider  acetaminophen (TYLENOL) 500 MG tablet Take 1,000 mg by mouth in the morning and at bedtime.   Yes [provider]  aspirin EC 81 MG tablet Take 81 mg by mouth daily. Swallow whole.   Yes [provider]  atorvastatin (LIPITOR) 80 MG tablet Take 1 tablet (80 mg total) by mouth daily. Patient taking differently: Take 80 mg by mouth at bedtime. 09/22/22 01/27/24 Yes Jerald Kief, MD  carvedilol (COREG) 12.5 MG tablet Take 1 tablet (12.5 mg total) by mouth 2 (two) times daily with a meal. 12/25/22  Yes Dorcas Carrow, MD  Cholecalciferol 25 MCG (1000 UT) capsule Take 2,000 Units by mouth daily.   Yes [provider]  Cranberry 450 MG CAPS Take 450 mg by mouth daily.   Yes  [provider]   cyclobenzaprine (FLEXERIL) 5 MG tablet Take 5 mg by mouth every 8 (eight) hours as needed for muscle spasms.   Yes [provider]  Dextran 70-Hypromellose (NATURAL BALANCE TEARS) 0.1-0.3 % SOLN Place 1 drop into both eyes every 4 (four) hours as needed (dry eyes).   Yes [provider]  escitalopram (LEXAPRO) 20 MG tablet Take 20 mg by mouth daily.   Yes [provider]  ferrous sulfate 325 (65 FE) MG tablet Take 1 tablet (325 mg total) by mouth 3 (three) times daily with meals. Patient taking differently: Take 325 mg by mouth daily. With meal. 01/23/23  Yes Cathren Laine, MD  fluticasone [facility]) 50 MCG/ACT nasal spray Place 1 spray into both nostrils daily.   Yes [provider]  furosemide (LASIX) 20 MG tablet Take 20-40 mg by mouth See admin instructions. Take 40 mg by mouth in the morning and 20 mg by mouth in the afternoon   Yes [provider]  haloperidol lactate (HALDOL) 5 MG/ML injection Inject 1 mg into the muscle every 6 (six) hours as needed (agitation/psychosis).   Yes [provider]  hydrALAZINE (APRESOLINE) 10 MG tablet Take 1 tablet (10 mg total) by mouth every 8 (eight) hours. 12/25/22  Yes Dorcas Carrow, MD  hydrOXYzine (VISTARIL) 50 MG capsule Take 50 mg by mouth at bedtime.   Yes [provider]  insulin glargine (LANTUS SOLOSTAR) 100 UNIT/ML Solostar Pen Inject 15 Units into the skin daily. Patient taking differently: Inject 15 Units into the skin at bedtime. 09/07/22  Yes Rai, Ripudeep K, MD  insulin lispro (HUMALOG) 100 UNIT/ML KwikPen Inject 0-10 Units into the skin See admin instructions. Inject 0-10 units into the skin three times a day and at bedtime, PER SLIDING SCALE:  BGL 151-200 = 2 units 201-250 = 4 units 251-300 = 6 units 301-350 =  8 units 351-400 = 10 units Patient taking differently: Inject 0-10 Units into the skin See admin instructions. Inject 0-10 units into the skin before meals and at bedtime,  PER SLIDING SCALE:  0-150 = 0 units 151-200 = 2 units 201-250 = 4 units 251-300 = 6 units 301-350 =  8 units 351-400 = 10 units 01/31/22  Yes Dahal, Melina Schools, MD  ipratropium-albuterol (DUONEB) 0.5-2.5 (3) MG/3ML SOLN Take 3 mLs by nebulization every 6 (six) hours as needed (for shortness of breath).   Yes [provider]  LACTOBACILLUS PO Take 1 capsule by mouth in the morning and at bedtime.   Yes [provider]  lidocaine (LIDODERM) 5 % Place 2 patches onto the skin daily. Remove & Discard patch within 12 hours or as directed by MD Patient taking differently: Place 1 patch onto the skin in the morning and at bedtime. Take off after 12 hours 12/25/22  Yes Dorcas Carrow, MD  loperamide (IMODIUM) 2 MG capsule Take 1 capsule (2 mg total) by mouth 2 (two) times daily. 12/25/22  Yes Dorcas Carrow, MD  loratadine (CLARITIN) 10 MG tablet Take 10 mg by mouth in the morning.   Yes [provider]  magnesium oxide (MAG-OX) 400 (240 Mg) MG tablet Take 400 mg by mouth in the morning, at noon, and at bedtime.   Yes [provider]  Melatonin 10 MG TABS Take 10 mg by mouth at bedtime.   Yes [provider]  Meth-Hyo-M Bl-Na Phos-Ph Sal (URO-MP) 118 MG CAPS Take 118 mg by mouth 2 (two) times daily.   Yes [provider]  Multiple Vitamin (MULTIVITAMIN WITH MINERALS) TABS tablet Take 1 tablet by mouth daily. 12/25/22  Yes Dorcas Carrow, MD  naloxone [facility]) 4 MG/0.1ML LIQD nasal spray kit Place 0.4 mg into the nose as needed (opiate OD).   Yes [provider]  omeprazole (PRILOSEC) 20 MG capsule Take 20 mg by mouth at bedtime.   Yes [provider]  ondansetron (ZOFRAN) 4 MG tablet Take 4 mg by mouth every 8 (eight) hours as needed for nausea or vomiting.   Yes [provider]  oxybutynin (DITROPAN-XL) 5 MG 24 hr tablet Take 5 mg by mouth in the morning and at bedtime.   Yes [provider]  oxyCODONE (OXY IR/ROXICODONE) 5  MG immediate release tablet Take 5 mg by mouth every 6 (six) hours as needed for moderate pain (pain score 4-6).   Yes [provider]  oxyCODONE (OXYCONTIN) 15 mg 12 hr tablet Take 15 mg by mouth in the morning, at noon, and at bedtime.   Yes [provider]  OXYGEN Inhale 2 L into the lungs as needed (to maintain sats above 90/SOB).   Yes [provider]  oxymetazoline (AFRIN) 0.05 % nasal spray Place 1 spray into both nostrils 2 (two) times daily as needed for congestion.   Yes [provider]  pregabalin (LYRICA) 100 MG capsule Take 1 capsule (100 mg total) by mouth 2 (two) times daily. Patient taking differently: Take 100 mg by mouth 3 (three) times daily. 12/25/22 06/27/23 Yes Dorcas Carrow, MD  pregabalin (LYRICA) 25 MG capsule Take 25 mg by mouth 3 (three) times daily.   Yes [provider]  QUEtiapine (SEROQUEL) 100 MG tablet Take 100 mg by mouth at bedtime.   Yes [provider]  senna (SENOKOT) 8.6 MG TABS tablet Take 2 tablets by mouth at bedtime.   Yes [provider]  sodium bicarbonate 650 MG tablet Take 650 mg by mouth 2 (two) times daily.   Yes [provider]  tamsulosin (FLOMAX) 0.4 MG CAPS capsule Take 2 capsules (0.8 mg total) by mouth daily. Patient taking differently: Take 0.4 mg by mouth daily. 02/09/23  Yes Peterson Ao, MD  tirzepatide [facility]) 2.5 MG/0.5ML Pen Inject 2.5 mg into the skin once a week. On Tuesdays   Yes [provider]  trolamine salicylate (ASPERCREME) 10 % cream Apply 1 Application topically in the morning, at noon, in the evening, and at bedtime. Right leg   Yes [provider]  UNABLE TO FIND Take 30 mLs by mouth in the morning and at bedtime. Med Name: liquid protein   Yes [provider]     Critical care time: 28 min

## 2023-07-04 NOTE — TOC Initial Note (Signed)
Transition of Care [facility]) - Initial/Assessment Note    Patient Details  Name: Gregory [name] MRN: [id number] Date of Birth: [birth year]  Transition of Care [facility]) CM/SW Contact:    Marliss Coots, LCSW Phone Number: 07/04/2023, 12:21 PM  Clinical Narrative:                  12:21 PM Per progressions, patient remains intubated with a cortrak but is weaning off drip. CSW will continue to follow.  Expected Discharge Plan: Skilled Nursing Facility Barriers to Discharge: Continued Medical Work up   Patient Goals and CMS Choice            Expected Discharge Plan and Services     Post Acute Care Choice: Skilled Nursing Facility Living arrangements for the past 2 months: Skilled Nursing Facility                                      Prior Living Arrangements/Services Living arrangements for the past 2 months: Skilled Nursing Facility Lives with:: Facility Resident Patient language and need for interpreter reviewed:: Yes        Need for Family Participation in Patient Care: Yes (Comment) Care giver support system in place?: Yes (comment)   Criminal Activity/Legal Involvement Pertinent to Current Situation/Hospitalization: No - Comment as needed  Activities of Daily Living   ADL Screening (condition at time of admission) Independently performs ADLs?: Yes (appropriate for developmental age) Is the patient deaf or have difficulty hearing?: No Does the patient have difficulty seeing, even when wearing glasses/contacts?: No Does the patient have difficulty concentrating, remembering, or making decisions?: No  Permission Sought/Granted Permission sought to share information with : Facility Medical sales representative, Family Supports Permission granted to share information with : No (Contact information on chart; from SNF)  Share Information with NAME: Francesca Oman  Permission granted to share info w AGENCY: [name]  Permission granted to share info w Relationship:  Daughter  Permission granted to share info w Contact Information: [phone]  Emotional Assessment   Attitude/Demeanor/Rapport: Unable to Assess Affect (typically observed): Unable to Assess   Alcohol / Substance Use: Not Applicable Psych Involvement: No (comment)  Admission diagnosis:  Respiratory failure (HCC) [J96.90] Acute respiratory failure with hypoxia (HCC) [J96.01] Encephalopathy, unspecified type [G93.40] Patient Active Problem List   Diagnosis Date Noted   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) 07/03/2023   Fever 06/28/2023   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   Altered mental status 02/08/2023   Abscess of abdominal cavity (HCC) 02/08/2023   Protein-calorie malnutrition, severe 12/19/2022   Occipital stroke (HCC) 11/27/2022   Skin ulcer of left ankle, limited to breakdown of skin (HCC) 09/19/2022   Stroke-like symptom 09/18/2022   Diabetic ulcer of lower leg (HCC) 09/18/2022   Constipation 09/18/2022   Shortness of breath 09/07/2022   Symptomatic anemia 09/05/2022   Chronic hypoxic respiratory failure (HCC) 09/05/2022   Respiratory failure (HCC) 08/22/2022   Pressure injury of skin 08/22/2022   Influenza A 08/22/2022   Sepsis (HCC) 08/22/2022   Pneumonia 05/01/2022   Right upper lobe pneumonia 09/05/2021   Hyponatremia 09/05/2021   Anxiety 09/05/2021   BPH (benign prostatic hyperplasia) 09/05/2021   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   Asymptomatic bacteriuria 06/30/2021   Normocytic anemia 06/29/2021   Pain of amputation stump of right lower extremity (HCC) 03/15/2021   Melanoma of cheek (HCC)  10/15/2019   Acute hypoxic respiratory failure (HCC) 07/11/2019   Acute respiratory disease due to COVID-19 virus 07/11/2019   Wound infection 06/22/2019   Abscess 06/22/2019   Chronic kidney disease, stage 3b (HCC) 05/23/2019   Tobacco abuse 05/23/2019   Cellulitis 05/23/2019   S/P AKA (above knee amputation)  unilateral, right (HCC) 12/05/2018   Amputation stump infection (HCC)    Lymphedema of left leg    Diabetic polyneuropathy associated with type 2 diabetes mellitus (HCC)    Above knee amputation of right lower extremity (HCC) 10/22/2018   Dehiscence of amputation stump (HCC)    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   Candidiasis 08/12/2018   Necrosis of toe (HCC) 07/08/2018   Amputated toe of left foot (HCC) 06/18/2018   Streptococcal infection    Pressure injury, stage 3 (HCC) 06/06/2018   Cutaneous abscess of left foot    Moderate protein-calorie malnutrition (HCC)    Cellulitis of left lower extremity    Septic arthritis (HCC) 06/03/2018   Idiopathic chronic venous hypertension of left lower extremity with ulcer and inflammation (HCC) 11/14/2017   Great toe amputation status, left 11/14/2017   Enlarged lymph node in neck 07/18/2017   Malignant melanoma of face (HCC) 04/16/2017   Osteomyelitis (HCC) 03/25/2017   Skin cancer 03/25/2017   Depression 11/05/2016   Diabetic ulcer of toe of left foot (HCC) 10/02/2016   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   Chronic pain 09/27/2016   DM2 (diabetes mellitus, type 2) (HCC) 06/06/2016   GERD without esophagitis 05/11/2016   Hypertensive heart disease with congestive heart failure (HCC) 12/15/2015   Spinal stenosis in cervical region 12/15/2015   Type II diabetes mellitus with neurological manifestations (HCC) 12/15/2015   Chronic diastolic HF (heart failure) (HCC)    Pulmonary hypertension (HCC)    HTN (hypertension)    HLD (hyperlipidemia)    Urinary retention    Diskitis    Foot drop, bilateral    Sepsis with acute renal failure without septic shock (HCC)    COPD (chronic obstructive pulmonary disease) (HCC) 10/03/2015   Acute osteomyelitis of left foot (HCC) 10/03/2015   MRSA bacteremia 08/13/2015   Chronic low back pain 08/13/2015   Chronic multifocal osteomyelitis, multiple sites (HCC) 08/13/2015   Community acquired  pneumonia 08/13/2015   Encephalopathy 03/25/2012   PCP:  [name], Provider, MD Pharmacy:  No Pharmacies Listed    Social Drivers of Health (SDOH) Social History: SDOH Screenings   Food Insecurity: No Food Insecurity (06/30/2023)  Housing: Low Risk  (06/30/2023)  Transportation Needs: No Transportation Needs (06/30/2023)  Utilities: Not At Risk (06/30/2023)  Depression (PHQ2-9): Low Risk  (03/08/2021)  Tobacco Use: High Risk (06/30/2023)   SDOH Interventions:     Readmission Risk Interventions    09/06/2022    1:48 PM 08/27/2022   12:46 PM 05/03/2022   11:53 AM  Readmission Risk Prevention Plan  Transportation Screening Complete Complete Complete  Medication Review Oceanographer) Complete Complete Complete  PCP or Specialist appointment within 3-5 days of discharge Complete Complete Complete  HRI or Home Care Consult Complete Complete Complete  SW Recovery Care/Counseling Consult Complete Complete Complete  Palliative Care Screening Not Applicable Not Applicable Not Applicable  Skilled Nursing Facility Complete Complete Complete

## 2023-07-05 ENCOUNTER — Inpatient Hospital Stay (HOSPITAL_COMMUNITY): Payer: Medicaid Other

## 2023-07-05 DIAGNOSIS — I63531 Cerebral infarction due to unspecified occlusion or stenosis of right posterior cerebral artery: Secondary | ICD-10-CM | POA: Diagnosis not present

## 2023-07-05 DIAGNOSIS — N1832 Chronic kidney disease, stage 3b: Secondary | ICD-10-CM | POA: Diagnosis not present

## 2023-07-05 DIAGNOSIS — J9601 Acute respiratory failure with hypoxia: Secondary | ICD-10-CM | POA: Diagnosis not present

## 2023-07-05 DIAGNOSIS — E119 Type 2 diabetes mellitus without complications: Secondary | ICD-10-CM | POA: Diagnosis not present

## 2023-07-05 DIAGNOSIS — A419 Sepsis, unspecified organism: Secondary | ICD-10-CM | POA: Diagnosis not present

## 2023-07-05 LAB — CBC
HCT: 29.2 % — ABNORMAL LOW (ref 39.0–52.0)
Hemoglobin: 9 g/dL — ABNORMAL LOW (ref 13.0–17.0)
MCH: 28.4 pg (ref 26.0–34.0)
MCHC: 30.8 g/dL (ref 30.0–36.0)
MCV: 92.1 fL (ref 80.0–100.0)
Platelets: 166 10*3/uL (ref 150–400)
RBC: 3.17 MIL/uL — ABNORMAL LOW (ref 4.22–5.81)
RDW: 16.1 % — ABNORMAL HIGH (ref 11.5–15.5)
WBC: 7 10*3/uL (ref 4.0–10.5)
nRBC: 0 % (ref 0.0–0.2)

## 2023-07-05 LAB — GLUCOSE, CAPILLARY
Glucose-Capillary: 170 mg/dL — ABNORMAL HIGH (ref 70–99)
Glucose-Capillary: 186 mg/dL — ABNORMAL HIGH (ref 70–99)
Glucose-Capillary: 188 mg/dL — ABNORMAL HIGH (ref 70–99)
Glucose-Capillary: 200 mg/dL — ABNORMAL HIGH (ref 70–99)
Glucose-Capillary: 203 mg/dL — ABNORMAL HIGH (ref 70–99)
Glucose-Capillary: 235 mg/dL — ABNORMAL HIGH (ref 70–99)

## 2023-07-05 LAB — BASIC METABOLIC PANEL
Anion gap: 6 (ref 5–15)
BUN: 91 mg/dL — ABNORMAL HIGH (ref 8–23)
CO2: 21 mmol/L — ABNORMAL LOW (ref 22–32)
Calcium: 9.1 mg/dL (ref 8.9–10.3)
Chloride: 118 mmol/L — ABNORMAL HIGH (ref 98–111)
Creatinine, Ser: 1.64 mg/dL — ABNORMAL HIGH (ref 0.61–1.24)
GFR, Estimated: 46 mL/min — ABNORMAL LOW (ref >60)
Glucose, Bld: 268 mg/dL — ABNORMAL HIGH (ref 70–99)
Potassium: 5.3 mmol/L — ABNORMAL HIGH (ref 3.5–5.1)
Sodium: 145 mmol/L (ref 135–145)

## 2023-07-05 MED ORDER — INSULIN ASPART 100 UNIT/ML IJ SOLN
0.0000 [IU] | INTRAMUSCULAR | Status: DC
  Administered 2023-07-05: 2 [IU] via SUBCUTANEOUS

## 2023-07-05 MED ORDER — INSULIN ASPART 100 UNIT/ML IJ SOLN
0.0000 [IU] | INTRAMUSCULAR | Status: DC
  Administered 2023-07-05: 5 [IU] via SUBCUTANEOUS
  Administered 2023-07-05: 3 [IU] via SUBCUTANEOUS
  Administered 2023-07-06: 2 [IU] via SUBCUTANEOUS
  Administered 2023-07-06 (×2): 3 [IU] via SUBCUTANEOUS
  Administered 2023-07-06: 5 [IU] via SUBCUTANEOUS
  Administered 2023-07-06 (×2): 3 [IU] via SUBCUTANEOUS
  Administered 2023-07-07 (×3): 5 [IU] via SUBCUTANEOUS
  Administered 2023-07-07 (×2): 3 [IU] via SUBCUTANEOUS
  Administered 2023-07-07 – 2023-07-08 (×3): 5 [IU] via SUBCUTANEOUS
  Administered 2023-07-08 (×2): 3 [IU] via SUBCUTANEOUS

## 2023-07-05 MED ORDER — LABETALOL HCL 5 MG/ML IV SOLN: 5.0000 mg | Freq: Once | INTRAVENOUS | Status: DC

## 2023-07-05 NOTE — Plan of Care (Signed)
  Problem: Safety: Goal: Non-violent Restraint(s) Outcome: Progressing   Problem: Education: Goal: Ability to describe self-care measures that may prevent or decrease complications (Diabetes Survival Skills Education) will improve Outcome: Not Progressing   Problem: Coping: Goal: Ability to adjust to condition or change in health will improve Outcome: Progressing   Problem: Fluid Volume: Goal: Ability to maintain a balanced intake and output will improve Outcome: Progressing   Problem: Health Behavior/Discharge Planning: Goal: Ability to identify and utilize available resources and services will improve Outcome: Not Progressing Goal: Ability to manage health-related needs will improve Outcome: Not Progressing   Problem: Metabolic: Goal: Ability to maintain appropriate glucose levels will improve Outcome: Progressing   Problem: Nutritional: Goal: Maintenance of adequate nutrition will improve Outcome: Progressing Goal: Progress toward achieving an optimal weight will improve Outcome: Progressing   Problem: Skin Integrity: Goal: Risk for impaired skin integrity will decrease Outcome: Progressing   Problem: Tissue Perfusion: Goal: Adequacy of tissue perfusion will improve Outcome: Progressing   Problem: Education: Goal: Knowledge of General Education information will improve Description: Including pain rating scale, medication(s)/side effects and non-pharmacologic comfort measures Outcome: Not Progressing

## 2023-07-05 NOTE — Procedures (Signed)
Cortrak  Person Inserting Tube:  Kendell Bane C, RD Tube Type:  Cortrak - 43 inches Tube Size:  10 Tube Location:  Left nare Secured by: Bridle Technique Used to Measure Tube Placement:  Marking at nare/corner of mouth Cortrak Secured At:  79 cm   Cortrak Tube Team Note:  Consult received to place a Cortrak feeding tube.   X-ray is required, abdominal x-ray has been ordered by the Cortrak team. Please confirm tube placement before using the Cortrak tube.   If the tube becomes dislodged please keep the tube and contact the Cortrak team at [email or web address] for replacement.  If after hours and replacement cannot be delayed, place a NG tube and confirm placement with an abdominal x-ray.    Cammy Copa., RD, LDN, CNSC See AMiON for contact information

## 2023-07-05 NOTE — Progress Notes (Signed)
Nutrition Follow-up  DOCUMENTATION CODES:  Not applicable  INTERVENTION:  Once confirmed, continue tube feeding via cortrak: Vital 1.5 at 60 ml/h (1440 ml per day) Prosource TF20 60 ml BID Free water q2h Provides 2320 kcal, 137 gm protein, 1100 ml free water daily ( free water, TF+flush) 1 packet Juven BID, each packet provides 95 calories, 2.5 grams of protein (collagen), and micronutrients to support wound healing  NUTRITION DIAGNOSIS:  Inadequate oral intake related to inability to eat as evidenced by NPO status. - remains applicable   GOAL:  Patient will meet greater than or equal to 90% of their needs - progressing, enteral feeds infusing and tolerated at goal  MONITOR:  Vent status, Labs, Weight trends, TF tolerance, Skin, I & O's  REASON FOR ASSESSMENT:  Consult Enteral/tube feeding initiation and management  ASSESSMENT:  Pt to ED from SNF after being found unresponsive. Dx of severe UTI, RSV, and bilateral PNA. 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 PMH: COPD, a-fib, CKD 3, T2DM, HFeEF, HTN, HLD, colostomy. Noted with recent increased amount of liquid stool from his ostomy.  1/9 - presented to ED from SNF, intubated, imaging showed acute on chronic right PCA stroke 1/10 - LP attempted at bedside and in IR, unsuccessful   Pt resting in bed at the time of assessment. Undergoing vent weaning trial this AM, a little agitated. Cortrak tube just placed as it is anticipated that pt will likely need a little time before being able to safely swallow and take in adequate PO.   Previously was tolerating TF well. Will continue current regimen once cortrak is confirmed to be in place.  MV: 13.7 L/min Temp (24hrs), Avg:97.8 F (36.6 C), Min:97.1 F (36.2 C), Max:98.4 F (36.9 C)  Admit weight: 84 kg  Current weight: 87.4 kg  12.6% weight loss noted over the last 5 months prior to admission which is severe if accurate   Intake/Output  Summary (Last 24 hours) at 07/05/2023 0952 Last data filed at 07/05/2023 6301 Gross per 24 hour  Intake 2450.08 ml  Output 4400 ml  Net -1949.92 ml  Net IO Since Admission: 1,994.71 mL [07/05/23 0952]  Drains/Lines: CVC triple lumen Right IJ ileostomy out x 24 hours OGT (gastric) UOP 3350 mL x 24 hours  Nutritionally Relevant Medications: Scheduled Meds:  atorvastatin  80 mg Per Tube Daily   famotidine  20 mg Per Tube Daily   feeding supplement (PROSource TF20)  60 mL Per Tube BID   fiber  1 packet Per Tube BID   free water  150 mL Per Tube Q2H   insulin aspart  0-9 Units Subcutaneous Q4H   nutrition supplement (JUVEN)  1 packet Per Tube BID BM   Continuous Infusions:  ceFEPime (MAXIPIME) IV 2 g (07/05/23 0817)   feeding supplement (VITAL 1.5 CAL) 60 mL/hr at 07/05/23 0800   PRN Meds: docusate sodium, polyethylene glycol  Labs Reviewed: K 5.3 Na 145, chloride 118 BUN 93, Creatinine 1.69 CBG ranges from 173-236 mg/dL over the last 24 hours HgbA1c 6.4%  NUTRITION - FOCUSED PHYSICAL EXAM: Flowsheet Row Most Recent Value  Orbital Region No depletion  Upper Arm Region Mild depletion  Thoracic and Lumbar Region No depletion  Buccal Region Unable to assess  Temple Region No depletion  Clavicle Bone Region No depletion  Clavicle and Acromion Bone Region No depletion  Scapular Bone Region Mild depletion  Dorsal Hand Unable to assess  Patellar Region Unable to assess  [R  AKA, LLE w/ wounds and significant swelling]  Anterior Thigh Region Unable to assess  [edema]  Posterior Calf Region Unable to assess  [RAKA, unable to assess L d/t swelling/wounds]  Edema (RD Assessment) Severe  Hair Reviewed  Eyes Reviewed  Mouth Unable to assess  Skin Reviewed  Nails Unable to assess    Diet Order:   Diet Order             Diet NPO time specified  Diet effective now                   EDUCATION NEEDS:  Not appropriate for education at this time  Skin:  Skin  Assessment: Reviewed RN Assessment Venous Statis ulcer - left leg (1 x 1 cm) - left third toe (1 x 1 x 0.5 cm) - left ankle (3 x 3 x 0.5 cm)  Last BM:  1/17 - type 7  Height:  Ht Readings from Last 1 Encounters:  06/27/23 6' 0.01" (1.829 m)    Weight:  Wt Readings from Last 1 Encounters:  07/05/23 87.4 kg    Ideal Body Weight:  72.8 kg (adjusted by 10% for AKA)  BMI:  Body mass index is 26.13 kg/m.  Estimated Nutritional Needs:  Kcal:  2100-2300 kcal Protein:  120-130g Fluid:  >2L/day    Greig Castilla, RD, LDN Registered Dietitian II Please reach out via secure chat Weekend on-call pager # available in [facility]

## 2023-07-05 NOTE — Progress Notes (Signed)
eLink Physician-Brief Progress Note Patient Name: Gregory [name] DOB: 02-09-1961 MRN: [id number]   Date of Service  07/05/2023  HPI/Events of Note  BP parameteres  eICU Interventions  ordered      On hydralazine 10 mg q8 per tube Also on Hydralazine IV prn for SBP >180 Will hold for SBP 120 or below  [facility] 07/05/2023, 10:30 PM

## 2023-07-05 NOTE — Progress Notes (Addendum)
NAME:  Gregory [name], MRN:  [id number], DOB:  24-Mar-1961, LOS: 8 ADMISSION DATE:  06/27/2023, CONSULTATION DATE:  06/27/23 REFERRING MD:  EDP, CHIEF COMPLAINT:  AMS   History of Present Illness:  63 year old male with significant past medical history including diabetes, hypertension, hyperlipidemia, CKDIIIb, COPD, chronic osteomyelitis s/p R AKA, L midfoot amputation, atrial fibrillation, HFpEF, mesenteric ischemia s/p colostomy 12/2022 who presented to ED from SNF for altered mental status/obtunded. Reported increased liquid stool from ostomy. He was subsequently intubated in the ED. Labs in ED notable for K 5.8, Cr 2.6, WBC 13, trop 83>705, BNP 63, UA large leuk, wbc21-50 few bacteria, UDS negative. Admitted to PCCM.   Pertinent  Medical History  COPD, CKD3b, mesenteric ischemia s/p colostomy 12/2022, T2DM, HFpEF, HTN, HLD   Significant Hospital Events: Including procedures, antibiotic start and stop dates in addition to other pertinent events   1/9: admit from ED for AMS, intubated, CVC 1/10: MRI w/ 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 Stroke team/neuro consulted. Recommend stroke work up, d/c meningitis/encephalitis coverage, palliative consult.  1/10 unsuccessful attempts at lumbar puncture 1/12 significant ventilator dyssynchrony, given 1 dose of Nimbex, on Versed drip 1/13 agitated overnight despite fentanyl and versed pushes and Haldol 1/14 remains agitated with sedation wean  1/16 off versed gtt, responds to painful stimuli but not following commands  Interim History / Subjective:  No overnight events. Awakes easier this morning and follows some commands   Objective   Blood pressure 123/61, pulse (!) 59, temperature 98.3 F (36.8 C), temperature source Oral, resp. rate (!) 25, height 6' 0.01" (1.829 m), weight 87.4 kg, SpO2 95%.    Vent Mode: PRVC FiO2 (%):  [40 %] 40 % Set Rate:  [20 bmp-28 bmp] 28 bmp Vt Set:  [600 mL] 600 mL PEEP:  [5 cmH20] 5  cmH20 Plateau Pressure:  [17 cmH20-22 cmH20] 18 cmH20   Intake/Output Summary (Last 24 hours) at 07/05/2023 0645 Last data filed at 07/05/2023 0600 Gross per 24 hour  Intake 2455.16 ml  Output 3900 ml  Net -1444.84 ml   Filed Weights   07/03/23 0411 07/04/23 0133 07/05/23 0430  Weight: 92.8 kg 85.7 kg 87.4 kg    Examination: General: chronically ill appearing, remains sedated on vent HENT: OG tube, ET tube in place Lungs: coarse breath sounds bilaterally Cardiovascular: regular rate, rhythm Abdomen: colostomy present w stool output, soft abdomen Extremities: R AKA, L midfoot amputation; left leg with edema and scabs Neuro: sedated; withdrawals extremities to painful stimuli and follows some commands GU: foley in place   Resolved Hospital Problem list   Thrombocytopenia  Assessment & Plan:   Septic shock 2/2 ESBL UTI and multifocal pneumonia Rhinovirus positive - off pressors - continue cefepime for total 7 day course  - completed 3 day course of meropenem for urine - trach aspirate growing klebsiella and serratia   Acute hypoxemic respiratory failure 2/2 multifocal pneumonia COPD Vent settings: PRVC, RR 28, TV 600, PEEP 5, FiO2 40%  - cefepime as above - wean fentanyl, precedex for sedation - if mentation remains stable/improves, will do SBT - VAP protocol, stress ulcer prophylaxis  - Brovana, Pulmicort, Yupelri  Encephalopathy/Agitation - LP unsuccessful - Weaning sedation as above - Continue seroquel 100 mg at bedtime, klonopin 2 mg TID, and lyrica 125 mg TID   Chronic pain - On oxycontin 15 mg BID and oxycodone 5 mg q6h prn at home - oxycodone to 20 mg q6h  - suspect opioid  tolerance is affecting ability to wean sedation   AKI on CKD IIIb - morning labs not yet back - 3.35 L UOP in last 24 hrs - maintain renal perfusion - avoid nephrotoxic meds - replete electrolytes prn  Hypernatremia - free water to 150 mL q2h   Normocytic anemia - Hb stable at  8.5 (1u prbc on 1/13) - [place] Hb >7  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  Order written to d/c  Foley:  Yes, and it is no longer needed Code Status:  full code Last date of multidisciplinary goals of care discussion Chandra Batch, updated at bedside 1/16]  Labs   CBC: Recent Labs  Lab 06/30/23 0358 07/01/23 0542 07/02/23 0448 07/03/23 0405 07/04/23 0451  WBC 7.3 4.2 5.9 7.5 6.2  NEUTROABS  --   --   --  3.7  --   HGB 8.5* 6.9* 8.6* 8.5* 8.3*  HCT 26.9* 21.3* 26.6* 27.3* 26.6*  MCV 91.2 92.2 89.0 91.3 93.0  PLT 135* 100* 135* 151 155    Basic Metabolic Panel: Recent Labs  Lab 06/29/23 0403 06/30/23 0358 07/01/23 0542 07/02/23 0448 07/03/23 0405 07/04/23 0451  NA 135 140 145 146* 149* 147*  K 4.4 4.5 4.1 4.1 4.7 4.9  CL 105 109 117* 117* 117* 115*  CO2 18* 21* 22 20* 24 21*  GLUCOSE 124* 117* 180* 183* 101* 205*  BUN 55* 52* 47* 50* 69* 93*  CREATININE 2.41* 2.08* 1.61* 1.37* 1.50* 1.69*  CALCIUM 8.4* 9.1 8.6* 9.1 9.1 9.0  MG 2.3 2.2 2.0 1.7 1.9  --   PHOS 4.5 3.9 2.9 2.4* 4.0  --    GFR: Estimated Creatinine Clearance: 48.5 mL/min (A) (by C-G formula based on SCr of 1.69 mg/dL (H)). Recent Labs  Lab 07/01/23 0542 07/02/23 0448 07/03/23 0405 07/04/23 0451  WBC 4.2 5.9 7.5 6.2    Liver Function Tests: No results for input(s): "AST", "ALT", "ALKPHOS", "BILITOT", "PROT", "ALBUMIN" in  the last 168 hours.  No results for input(s): "LIPASE", "AMYLASE" in the last 168 hours. No results for input(s): "AMMONIA" in the last 168 hours.   ABG    Component Value Date/Time   PHART 7.211 (L) 06/28/2023 0803   PCO2ART 51.2 (H) 06/28/2023 0803   PO2ART 99 06/28/2023 0803   HCO3 20.4 06/28/2023 0803   TCO2 22 06/28/2023 0803   ACIDBASEDEF 7.0 (H) 06/28/2023 0803   O2SAT 96 06/28/2023 0803     Coagulation Profile: No results for input(s): "INR", "PROTIME" in the last 168 hours.   Cardiac Enzymes: No results for input(s): "CKTOTAL", "CKMB", "CKMBINDEX", "TROPONINI" in the last 168 hours.  HbA1C: Hemoglobin A1C  Date/Time Value Ref Range Status  08/14/2016 12:00 AM 5.6  Final  01/18/2016 12:00 AM 5.5  Final   Hgb A1c MFr Bld  Date/Time Value Ref Range Status  06/27/2023 09:25 AM 6.4 (H) 4.8 -  5.6 % Final    Comment:    (NOTE) Pre diabetes:          5.7%-6.4%  Diabetes:              >6.4%  Glycemic control for   <7.0% adults with diabetes   12/03/2022 04:54 PM 6.9 (H) 4.8 - 5.6 % Final    Comment:    (NOTE) Pre diabetes:          5.7%-6.4%  Diabetes:              >6.4%  Glycemic control for   <7.0% adults with diabetes     CBG: Recent Labs  Lab 07/04/23 1131 07/04/23 1556 07/04/23 1920 07/04/23 2324 07/05/23 0328  GLUCAP 216* 236* 181* 177* 200*    Review of Systems:   Unable to obtain  Past Medical History:  He,  has a past medical history of Acquired absence of left great toe (HCC), Acquired absence of right leg below knee (HCC), Acute osteomyelitis of calcaneum, right (HCC) (10/02/2016), Acute respiratory failure (HCC), Amputation stump infection (HCC) (08/12/2018), Anemia, Basal cell carcinoma, eyelid, Cancer (HCC), Candidiasis (08/12/2018), CHF (congestive heart failure) (HCC), Chronic back pain, Chronic kidney disease, Chronic multifocal osteomyelitis (HCC), Chronic pain syndrome, COPD (chronic obstructive pulmonary disease) (HCC), DDD  (degenerative disc disease), lumbar, Depression, Diabetes mellitus without complication (HCC), Diabetic ulcer of toe of left foot (HCC) (10/02/2016), Difficult intubation, Difficulty in walking, not elsewhere classified, 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), Foot drop, bilateral, Foot drop, right (12/27/2015), GERD (gastroesophageal reflux disease), Herpesviral vesicular dermatitis, History of COVID-19 (03/15/2022), Hyperlipidemia, Hyperlipidemia, Hypertension, Insomnia, Malignant melanoma of other parts of face (HCC), Melanoma (HCC), MRSA bacteremia, Muscle weakness (generalized), Nasal congestion, Necrosis of toe (HCC) (07/08/2018), Neuromuscular dysfunction of bladder, Osteomyelitis (HCC), Osteomyelitis (HCC), Osteomyelitis (HCC), Other idiopathic peripheral autonomic neuropathy, Retention of urine, unspecified, Squamous cell carcinoma of skin, Stroke (HCC), Unsteadiness on feet, Urinary retention, Urinary retention, Urinary tract infection, Wears dentures, and Wears glasses.   Surgical History:   Past Surgical History:  Procedure Laterality Date   AMPUTATION Left 03/29/2017   Procedure: LEFT GREAT TOE AMPUTATION AT METATARSOPHALANGEAL JOINT;  Surgeon: Nadara Mustard, MD;  Location: [facility] OR;  Service: Orthopedics;  Laterality: Left;   AMPUTATION Right 03/29/2017   Procedure: RIGHT BELOW KNEE AMPUTATION;  Surgeon: Nadara Mustard, MD;  Location: [facility] OR;  Service: Orthopedics;  Laterality: Right;   AMPUTATION Left 06/06/2018   Procedure: LEFT 2ND TOE AMPUTATION;  Surgeon: Nadara Mustard, MD;  Location: [facility] OR;  Service: Orthopedics;  Laterality: Left;   AMPUTATION Right 11/19/2018   Procedure: AMPUTATION ABOVE KNEE;  Surgeon: Nadara Mustard, MD;  Location: [facility] OR;  Service: Orthopedics;  Laterality: Right;   ANTERIOR CERVICAL CORPECTOMY N/A 11/25/2015   Procedure: ANTERIOR CERVICAL FIVE CORPECTOMY Cervical four - six fusion;  Surgeon: Lisbeth Renshaw, MD;  Location: [facility] OR[facility];  Service: Neurosurgery;   Laterality: N/A;  ANTERIOR CERVICAL FIVE CORPECTOMY Cervical four - six fusion   APPLICATION OF WOUND VAC Right 10/22/2018   Procedure: Application Of Wound Vac;  Surgeon: Nadara Mustard, MD;  Location: [facility] OR;  Service: Orthopedics;  Laterality: Right;   CYSTOSCOPY WITH BIOPSY N/A 05/01/2022   Procedure: CYSTOSCOPY WITH URETHRAL BIOPSY;  Surgeon: Noel Christmas, MD;  Location: [facility] ORS;  Service: Urology;  Laterality: N/A;  1 HR   CYSTOSCOPY WITH RETROGRADE URETHROGRAM N/A 04/25/2018   Procedure: CYSTOSCOPY WITH RETROGRADE URETHROGRAM/ BALLOON DILATION;  Surgeon: Ihor Gully, MD;  Location: [facility]  ORS;  Service: Urology;  Laterality: N/A;   CYSTOSCOPY WITH URETHRAL DILATATION N/A 05/01/2022   Procedure: BALLOON DILATION WITH  OPTILUME;  Surgeon: Noel Christmas, MD;  Location: [facility] ORS;  Service: Urology;  Laterality: N/A;   LAPAROTOMY N/A 12/03/2022   Procedure: EXPLORATORY LAPAROTOMY  total abdominal colectomy with end ileostomy;  Surgeon: Kinsinger, De Blanch, MD;  Location: [facility] OR;  Service: General;  Laterality: N/A;   MULTIPLE TOOTH EXTRACTIONS     POSTERIOR CERVICAL FUSION/FORAMINOTOMY N/A 11/29/2015   Procedure: Cervical Three-Cervical Seven Posterior Cervical Laminectomy with Fusion;  Surgeon: Lisbeth Renshaw, MD;  Location: [facility];  Service: Neurosurgery;  Laterality: N/A;  Cervical Three-Cervical Seven Posterior Cervical Laminectomy with Fusion   STUMP REVISION Right 10/22/2018   Procedure: REVISION RIGHT BELOW KNEE AMPUTATION;  Surgeon: Nadara Mustard, MD;  Location: [facility] OR;  Service: Orthopedics;  Laterality: Right;   STUMP REVISION Right 12/05/2018   Procedure: Revision Right Above Knee Amputation;  Surgeon: Nadara Mustard, MD;  Location: [facility] OR;  Service: Orthopedics;  Laterality: Right;   STUMP REVISION Right 05/29/2019   Procedure: REVISION RIGHT ABOVE KNEE AMPUTATION;  Surgeon: Nadara Mustard, MD;  Location: [facility] OR;  Service: Orthopedics;  Laterality: Right;   STUMP REVISION Right  06/24/2019   Procedure: STUMP REVISION;  Surgeon: Nadara Mustard, MD;  Location: [facility] OR;  Service: Orthopedics;  Laterality: Right;     Social History:   reports that he has been smoking cigarettes. He has never used smokeless tobacco. He reports that he does not drink alcohol and does not use drugs.   Family History:  His family history includes Bone cancer in his father; Cancer in his father, mother, paternal grandfather, and another family member; Diabetes in his mother and another family member; Heart disease in his mother; Prostate cancer in his father.   Allergies Allergies  Allergen Reactions   Latex Other (See Comments)    "ALLERGIC," per [facility]     Home Medications  Prior to Admission medications   Medication Sig Start Date End Date Taking? Authorizing Provider  acetaminophen (TYLENOL) 500 MG tablet Take 1,000 mg by mouth in the morning and at bedtime.   Yes [provider]  aspirin EC 81 MG tablet Take 81 mg by mouth daily. Swallow whole.   Yes [provider]  atorvastatin (LIPITOR) 80 MG tablet Take 1 tablet (80 mg total) by mouth daily. Patient taking differently: Take 80 mg by mouth at bedtime. 09/22/22 01/27/24 Yes Jerald Kief, MD  carvedilol (COREG) 12.5 MG tablet Take 1 tablet (12.5 mg total) by mouth 2 (two) times daily with a meal. 12/25/22  Yes Dorcas Carrow, MD  Cholecalciferol 25 MCG (1000 UT) capsule Take 2,000 Units by mouth daily.   Yes [provider]  Cranberry 450 MG CAPS Take 450 mg by mouth daily.   Yes [provider]  cyclobenzaprine (FLEXERIL) 5 MG tablet Take 5 mg by mouth every 8 (eight) hours as needed for muscle spasms.   Yes [provider]  Dextran 70-Hypromellose (NATURAL BALANCE TEARS) 0.1-0.3 % SOLN Place 1 drop into both eyes every 4 (four) hours as needed (dry eyes).   Yes [provider]  escitalopram (LEXAPRO) 20 MG tablet Take 20 mg by mouth daily.   Yes [provider]  ferrous  sulfate 325 (65 FE) MG tablet Take 1 tablet (325 mg total) by mouth 3 (three) times daily with meals. Patient taking differently: Take 325 mg by mouth  daily. With meal. 01/23/23  Yes Cathren Laine, MD  fluticasone [facility]) 50 MCG/ACT nasal spray Place 1 spray into both nostrils daily.   Yes [provider]  furosemide (LASIX) 20 MG tablet Take 20-40 mg by mouth See admin instructions. Take 40 mg by mouth in the morning and 20 mg by mouth in the afternoon   Yes [provider]  haloperidol lactate (HALDOL) 5 MG/ML injection Inject 1 mg into the muscle every 6 (six) hours as needed (agitation/psychosis).   Yes [provider]  hydrALAZINE (APRESOLINE) 10 MG tablet Take 1 tablet (10 mg total) by mouth every 8 (eight) hours. 12/25/22  Yes Dorcas Carrow, MD  hydrOXYzine (VISTARIL) 50 MG capsule Take 50 mg by mouth at bedtime.   Yes [provider]  insulin glargine (LANTUS SOLOSTAR) 100 UNIT/ML Solostar Pen Inject 15 Units into the skin daily. Patient taking differently: Inject 15 Units into the skin at bedtime. 09/07/22  Yes Rai, Ripudeep K, MD  insulin lispro (HUMALOG) 100 UNIT/ML KwikPen Inject 0-10 Units into the skin See admin instructions. Inject 0-10 units into the skin three times a day and at bedtime, PER SLIDING SCALE:  BGL 151-200 = 2 units 201-250 = 4 units 251-300 = 6 units 301-350 =  8 units 351-400 = 10 units Patient taking differently: Inject 0-10 Units into the skin See admin instructions. Inject 0-10 units into the skin before meals and at bedtime, PER SLIDING SCALE:  0-150 = 0 units 151-200 = 2 units 201-250 = 4 units 251-300 = 6 units 301-350 =  8 units 351-400 = 10 units 01/31/22  Yes Dahal, Melina Schools, MD  ipratropium-albuterol (DUONEB) 0.5-2.5 (3) MG/3ML SOLN Take 3 mLs by nebulization every 6 (six) hours as needed (for shortness of breath).   Yes [provider]  LACTOBACILLUS PO Take 1 capsule by mouth in the morning and at bedtime.    Yes [provider]  lidocaine (LIDODERM) 5 % Place 2 patches onto the skin daily. Remove & Discard patch within 12 hours or as directed by MD Patient taking differently: Place 1 patch onto the skin in the morning and at bedtime. Take off after 12 hours 12/25/22  Yes Dorcas Carrow, MD  loperamide (IMODIUM) 2 MG capsule Take 1 capsule (2 mg total) by mouth 2 (two) times daily. 12/25/22  Yes Dorcas Carrow, MD  loratadine (CLARITIN) 10 MG tablet Take 10 mg by mouth in the morning.   Yes [provider]  magnesium oxide (MAG-OX) 400 (240 Mg) MG tablet Take 400 mg by mouth in the morning, at noon, and at bedtime.   Yes [provider]  Melatonin 10 MG TABS Take 10 mg by mouth at bedtime.   Yes [provider]  Meth-Hyo-M Bl-Na Phos-Ph Sal (URO-MP) 118 MG CAPS Take 118 mg by mouth 2 (two) times daily.   Yes [provider]  Multiple Vitamin (MULTIVITAMIN WITH MINERALS) TABS tablet Take 1 tablet by mouth daily. 12/25/22  Yes Dorcas Carrow, MD  naloxone [facility]) 4 MG/0.1ML LIQD nasal spray kit Place 0.4 mg into the nose as needed (opiate OD).   Yes [provider]  omeprazole (PRILOSEC) 20 MG capsule Take 20 mg by mouth at bedtime.   Yes [provider]  ondansetron (ZOFRAN) 4 MG tablet Take 4 mg by mouth every 8 (eight) hours as needed for nausea or vomiting.   Yes [provider]  oxybutynin (DITROPAN-XL) 5 MG 24 hr tablet Take 5 mg by mouth in the  morning and at bedtime.   Yes [provider]  oxyCODONE (OXY IR/ROXICODONE) 5 MG immediate release tablet Take 5 mg by mouth every 6 (six) hours as needed for moderate pain (pain score 4-6).   Yes [provider]  oxyCODONE (OXYCONTIN) 15 mg 12 hr tablet Take 15 mg by mouth in the morning, at noon, and at bedtime.   Yes [provider]  OXYGEN Inhale 2 L into the lungs as needed (to maintain sats above 90/SOB).   Yes [provider]  oxymetazoline (AFRIN)  0.05 % nasal spray Place 1 spray into both nostrils 2 (two) times daily as needed for congestion.   Yes [provider]  pregabalin (LYRICA) 100 MG capsule Take 1 capsule (100 mg total) by mouth 2 (two) times daily. Patient taking differently: Take 100 mg by mouth 3 (three) times daily. 12/25/22 06/27/23 Yes Dorcas Carrow, MD  pregabalin (LYRICA) 25 MG capsule Take 25 mg by mouth 3 (three) times daily.   Yes [provider]  QUEtiapine (SEROQUEL) 100 MG tablet Take 100 mg by mouth at bedtime.   Yes [provider]  senna (SENOKOT) 8.6 MG TABS tablet Take 2 tablets by mouth at bedtime.   Yes [provider]  sodium bicarbonate 650 MG tablet Take 650 mg by mouth 2 (two) times daily.   Yes [provider]  tamsulosin (FLOMAX) 0.4 MG CAPS capsule Take 2 capsules (0.8 mg total) by mouth daily. Patient taking differently: Take 0.4 mg by mouth daily. 02/09/23  Yes Peterson Ao, MD  tirzepatide [name]) 2.5 MG/0.5ML Pen Inject 2.5 mg into the skin once a week. On Tuesdays   Yes [provider]  trolamine salicylate (ASPERCREME) 10 % cream Apply 1 Application topically in the morning, at noon, in the evening, and at bedtime. Right leg   Yes [provider]  UNABLE TO FIND Take 30 mLs by mouth in the morning and at bedtime. Med Name: liquid protein   Yes [provider]     Critical care time: 32 min

## 2023-07-05 NOTE — Progress Notes (Signed)
STROKE TEAM PROGRESS NOTE   SUBJECTIVE (INTERVAL HISTORY) His RN is at the bedside. Pt remains intubated   but sedation is off this morning.  Patient is drowsy but arousable.  Follows midline and simple commands in all 4 extremities.  He has some right gaze preference but able to look to the left.  He blinks to threat more on the right than the left.  He is able to move all 4 extremities against gravity but moves the left arm and leg slightly less than the right side. OBJECTIVE Temp:  [97.6 F (36.4 C)-99.8 F (37.7 C)] 99.8 F (37.7 C) (01/17 1127) Pulse Rate:  [55-93] 91 (01/17 1200) Cardiac Rhythm: Normal sinus rhythm;Sinus bradycardia (01/17 0800) Resp:  [20-57] 35 (01/17 1200) BP: (106-202)/(55-98) 191/83 (01/17 1200) SpO2:  [87 %-100 %] 99 % (01/17 1200) FiO2 (%):  [40 %] 40 % (01/17 0807) Weight:  [87.4 kg] 87.4 kg (01/17 0430)  Recent Labs  Lab 07/04/23 1920 07/04/23 2324 07/05/23 0328 07/05/23 0753 07/05/23 1131  GLUCAP 181* 177* 200* 203* 170*   Recent Labs  Lab 06/29/23 0403 06/30/23 0358 07/01/23 0542 07/02/23 0448 07/03/23 0405 07/04/23 0451 07/05/23 1043  NA 135 140 145 146* 149* 147* 145  K 4.4 4.5 4.1 4.1 4.7 4.9 5.3*  CL 105 109 117* 117* 117* 115* 118*  CO2 18* 21* 22 20* 24 21* 21*  GLUCOSE 124* 117* 180* 183* 101* 205* 268*  BUN 55* 52* 47* 50* 69* 93* 91*  CREATININE 2.41* 2.08* 1.61* 1.37* 1.50* 1.69* 1.64*  CALCIUM 8.4* 9.1 8.6* 9.1 9.1 9.0 9.1  MG 2.3 2.2 2.0 1.7 1.9  --   --   PHOS 4.5 3.9 2.9 2.4* 4.0  --   --    No results for input(s): "AST", "ALT", "ALKPHOS", "BILITOT", "PROT", "ALBUMIN" in the last 168 hours.  Recent Labs  Lab 07/01/23 0542 07/02/23 0448 07/03/23 0405 07/04/23 0451 07/05/23 1043  WBC 4.2 5.9 7.5 6.2 7.0  NEUTROABS  --   --  3.7  --   --   HGB 6.9* 8.6* 8.5* 8.3* 9.0*  HCT 21.3* 26.6* 27.3* 26.6* 29.2*  MCV 92.2 89.0 91.3 93.0 92.1  PLT 100* 135* 151 155 166   No results for input(s): "CKTOTAL", "CKMB",  "CKMBINDEX", "TROPONINI" in the last 168 hours. No results for input(s): "LABPROT", "INR" in the last 72 hours.  No results for input(s): "COLORURINE", "LABSPEC", "PHURINE", "GLUCOSEU", "HGBUR", "BILIRUBINUR", "KETONESUR", "PROTEINUR", "UROBILINOGEN", "NITRITE", "LEUKOCYTESUR" in the last 72 hours.  Invalid input(s): "APPERANCEUR"      Component Value Date/Time   CHOL 107 06/29/2023 0403   TRIG 521 (H) 06/29/2023 0403   HDL <10 (L) 06/29/2023 0403   CHOLHDL NOT CALCULATED 06/29/2023 0403   VLDL UNABLE TO CALCULATE IF TRIGLYCERIDE OVER 400 mg/dL 09/81/1914 7829   LDLCALC UNABLE TO CALCULATE IF TRIGLYCERIDE OVER 400 mg/dL 56/21/3086 5784   Lab Results  Component Value Date   HGBA1C 6.4 (H) 06/27/2023      Component Value Date/Time   LABOPIA NONE DETECTED 06/27/2023 0748   COCAINSCRNUR NONE DETECTED 06/27/2023 0748   LABBENZ NONE DETECTED 06/27/2023 0748   AMPHETMU NONE DETECTED 06/27/2023 0748   THCU NONE DETECTED 06/27/2023 0748   LABBARB NONE DETECTED 06/27/2023 0748    No results for input(s): "ETH" in the last 168 hours.  I have personally reviewed the radiological images below and agree with the radiology interpretations.  VAS [place] TRANSCRANIAL DOPPLER Result Date: 07/02/2023  Transcranial Doppler Patient Name:  [name]  Date of Exam:   07/01/2023 Medical Rec #: [id number]     Accession #:    [id number] Date of Birth: [birth year]    Patient Gender: M Patient Age:   63 years Exam Location:  [facility] Procedure:      VAS [place] TRANSCRANIAL DOPPLER Referring Phys: Elmer Picker --------------------------------------------------------------------------------  Indications: Stroke. History: PAD, right AKA, HTN, HLD, DM, CHF. Limitations: Ventilation, trach collar, body habitus Comparison Study: No prior study Performing Technologist: Sherren Kerns RVS  Examination Guidelines: A complete evaluation includes B-mode imaging, spectral Doppler, color Doppler, and power Doppler as  needed of all accessible portions of each vessel. Bilateral testing is considered an integral part of a complete examination. Limited examinations for reoccurring indications may be performed as noted.  +----------+---------------+----------+-----------+----------------------------+ RIGHT TCD Right VM (cm/s)Depth (cm)Pulsatility          Comment            +----------+---------------+----------+-----------+----------------------------+ MCA             24                    1.12     unable to insonate mid and                                                        proximal MCA         +----------+---------------+----------+-----------+----------------------------+ ACA                                                unable to insonate      +----------+---------------+----------+-----------+----------------------------+ Term ICA        34                    1.18                                 +----------+---------------+----------+-----------+----------------------------+ PCA P1          62                    1.05                                 +----------+---------------+----------+-----------+----------------------------+ Opthalmic       32                    1.36                                 +----------+---------------+----------+-----------+----------------------------+ ICA siphon      29                    1.41                                 +----------+---------------+----------+-----------+----------------------------+ Vertebral  unable to insonate      +----------+---------------+----------+-----------+----------------------------+  +----------+--------------+----------+-----------+------------------+ LEFT TCD  Left VM (cm/s)Depth (cm)Pulsatility     Comment       +----------+--------------+----------+-----------+------------------+ MCA             37                   1.23                        +----------+--------------+----------+-----------+------------------+ ACA            -15                   1.36                       +----------+--------------+----------+-----------+------------------+ Term ICA        37                   0.97                       +----------+--------------+----------+-----------+------------------+ PCA P1         -37                   1.43                       +----------+--------------+----------+-----------+------------------+ Opthalmic       35                   1.45                       +----------+--------------+----------+-----------+------------------+ ICA siphon      33                   1.72                       +----------+--------------+----------+-----------+------------------+ Vertebral                                    unable to insonate +----------+--------------+----------+-----------+------------------+  +------------+-------+-------+             VM cm/sComment +------------+-------+-------+ Prox Basilar  -19          +------------+-------+-------+ Summary:  Poor windows throughout limit evaluation. Low normal mean flow velocities in majority of identified vessels of anterior and posterior cerebral circulations..Elevated right posterior cerebral artery mean flow velocities suggest mild stenosis.Globally elevate dpulsatility indices suggest diffuse intracranial atherosclerosis likely. *See table(s) above for TCD measurements and observations.  Diagnosing physician: Delia Heady MD Electronically signed by Delia Heady MD on 07/02/2023 at 11:46:03 AM.    Final    VAS US CAROTID (at [facility] and [facility] only) Result Date: 07/02/2023 Carotid Arterial Duplex Study Patient Name:  CLEARANCE CHRISTE  Date of Exam:   07/01/2023 Medical Rec #: [id number]     Accession #:    [id number] Date of Birth: 1961/06/09    Patient Gender: M Patient Age:   37 years Exam Location:  [facility] Procedure:      VAS US CAROTID Referring Phys:  Milon Dikes --------------------------------------------------------------------------------  Indications:       CVA. Risk Factors:      Hypertension, hyperlipidemia, Diabetes, current smoker, prior  CVA, PAD. Other Factors:     Right AKA. Limitations        Today's exam was limited due to the neck habitus of the                    patient, bandages, line and patient on a ventilator. Comparison Study:  Prior carotid duplex done 09/19/22 indicating bilateral 1-39%                    ICA stenosis Performing Technologist: Sherren Kerns RVS  Examination Guidelines: A complete evaluation includes B-mode imaging, spectral Doppler, color Doppler, and power Doppler as needed of all accessible portions of each vessel. Bilateral testing is considered an integral part of a complete examination. Limited examinations for reoccurring indications may be performed as noted.  Right Carotid Findings: +--------+--------+--------+--------+------------------+--------+         PSV cm/sEDV cm/sStenosisPlaque DescriptionComments +--------+--------+--------+--------+------------------+--------+ ICA Prox                                          tortuous +--------+--------+--------+--------+------------------+--------+ ECA     182     21                                         +--------+--------+--------+--------+------------------+--------+  Left Carotid Findings: +--------+--------+--------+--------+------------------+--------+         PSV cm/sEDV cm/sStenosisPlaque DescriptionComments +--------+--------+--------+--------+------------------+--------+ CCA [id number]      27                                         +--------+--------+--------+--------+------------------+--------+   Summary: Right Carotid: Significantly limited study. Left Carotid: Significantly limited study.  *See table(s) above for measurements and observations.  Electronically signed by Delia Heady MD on 07/02/2023 at  11:43:28 AM.    Final    DG FL GUIDED LUMBAR PUNCTURE Result Date: 07/01/2023 CLINICAL DATA:  63 year old male with altered mental status. Lumbar puncture requested. EXAM: LUMBAR PUNCTURE UNDER FLUOROSCOPY PROCEDURE: An appropriate skin entry site was determined fluoroscopically. Operator donned sterile gloves and mask. Skin site was marked, then prepped with Betadine, draped in usual sterile fashion, and infiltrated locally with 1% lidocaine. A 20 gauge spinal needle advancement was attempted at L3-L4, L4-L5, and L5-S1 by this provider as well as additional attempts at L2-L3 and L3-L4 by Dr. Reche Dixon without return of CSF. After multiple unsuccessful attempts the needle was removed. The patient tolerated the procedure well and there were no complications. FLUOROSCOPY: Radiation Exposure Index (as provided by the fluoroscopic device): 105.3 mGy Kerma IMPRESSION: Unsuccessful lumbar puncture under fluoroscopy. This exam was performed by Loyce Dys [name], and was supervised and interpreted by Jeronimo Greaves, MD. Electronically Signed   By: Jeronimo Greaves M.D.   On: 07/01/2023 08:14   DG Abd 1 View Result Date: 06/30/2023 CLINICAL DATA:  Ileus. EXAM: ABDOMEN - 1 VIEW COMPARISON:  06/27/2023 FINDINGS: The marked gaseous dilatation of the stomach seen on the previous study has resolved in the interval. No gaseous small bowel or colonic dilatation on the current study. NG tube tip is in the stomach with proximal side port below the GE junction. Rectal temperature probe overlies the low pelvis. IMPRESSION: Interval resolution of  gaseous dilatation of the stomach. No gaseous small bowel or colonic dilatation on the current study. Electronically Signed   By: Kennith Center M.D.   On: 06/30/2023 06:34   DG CHEST PORT 1 VIEW Result Date: 06/30/2023 CLINICAL DATA:  63 year old male with respiratory failure, ventilator dependent. EXAM: PORTABLE CHEST 1 VIEW COMPARISON:  Portable chest 06/28/2023 and earlier. FINDINGS:  Portable AP semi upright view at 0421 hours. Endotracheal tube tip in good position between the clavicles and carina. Stable right IJ central line. Enteric tube loops in the stomach. Lung volumes and mediastinal contours are stable and within normal limits. Mildly rotated to the left. Partially visible cervical spine fusion hardware. Regressed bilateral lower lung consolidation, perihilar opacity since 06/27/2023. No areas of worsening ventilation since that time. No pneumothorax or pleural effusion. No pulmonary edema. Paucity of bowel gas.  Stable visualized osseous structures. IMPRESSION: 1.  Stable lines and tubes. 2. Regressed bilateral consolidation/pneumonia since 06/27/2023. Bilateral improved ventilation. No new cardiopulmonary abnormality. Electronically Signed   By: Odessa Fleming M.D.   On: 06/30/2023 05:42   ECHOCARDIOGRAM COMPLETE Result Date: 06/28/2023    ECHOCARDIOGRAM REPORT   Patient Name:   KACIN CESARE Date of Exam: 06/28/2023 Medical Rec #:  [id number]    Height:       72.0 in Accession #:    [id number]   Weight:       212.5 lb Date of Birth:  [birth year]   BSA:          2.186 m Patient Age:    62 years     BP:           101/58 mmHg Patient Gender: M            HR:           90 bpm. Exam Location:  Inpatient Procedure: 2D Echo, Cardiac Doppler and Color Doppler Indications:    Stroke I63.9  History:        Patient has prior history of Echocardiogram examinations, most                 recent 12/06/2022. CHF, COPD, Stroke and CKD, stage 3,                 Signs/Symptoms:Shortness of Breath; Risk Factors:Hypertension,                 Diabetes, Current Smoker and Dyslipidemia.  Sonographer:    Lucendia Herrlich RCS Referring Phys: [id number] ASHISH ARORA IMPRESSIONS  1. Left ventricular ejection fraction, by estimation, is 65 to 70%. The left ventricle has hyperdynamic function. The left ventricle has no regional wall motion abnormalities. Left ventricular diastolic parameters were normal.  2. Right  ventricular systolic function is normal. The right ventricular size is mildly enlarged. Tricuspid regurgitation signal is inadequate for assessing PA pressure.  3. The mitral valve is normal in structure. No evidence of mitral valve regurgitation. No evidence of mitral stenosis.  4. The aortic valve is normal in structure. There is mild calcification of the aortic valve. There is mild thickening of the aortic valve. Aortic valve regurgitation is mild. Aortic valve sclerosis/calcification is present, without any evidence of aortic stenosis.  5. The inferior vena cava is normal in size with <50% respiratory variability, suggesting right atrial pressure of 8 mmHg. FINDINGS  Left Ventricle: Left ventricular ejection fraction, by estimation, is 65 to 70%. The left ventricle has hyperdynamic function. The left ventricle has no regional wall motion abnormalities. The left  ventricular internal cavity size was small. There is no  left ventricular hypertrophy. Left ventricular diastolic parameters were normal. Right Ventricle: The right ventricular size is mildly enlarged. No increase in right ventricular wall thickness. Right ventricular systolic function is normal. Tricuspid regurgitation signal is inadequate for assessing PA pressure. Left Atrium: Left atrial size was normal in size. Right Atrium: Right atrial size was normal in size. Pericardium: There is no evidence of pericardial effusion. Mitral Valve: The mitral valve is normal in structure. No evidence of mitral valve regurgitation. No evidence of mitral valve stenosis. Tricuspid Valve: The tricuspid valve is normal in structure. Tricuspid valve regurgitation is trivial. No evidence of tricuspid stenosis. Aortic Valve: The aortic valve is normal in structure. There is mild calcification of the aortic valve. There is mild thickening of the aortic valve. Aortic valve regurgitation is mild. Aortic valve sclerosis/calcification is present, without any evidence of aortic  stenosis. Aortic valve mean gradient measures 10.0 mmHg. Aortic valve peak gradient measures 20.2 mmHg. Aortic valve area, by VTI measures 2.25 cm. Pulmonic Valve: The pulmonic valve was normal in structure. Pulmonic valve regurgitation is not visualized. No evidence of pulmonic stenosis. Aorta: The aortic root and ascending aorta are structurally normal, with no evidence of dilitation. Venous: The inferior vena cava is normal in size with less than 50% respiratory variability, suggesting right atrial pressure of 8 mmHg. IAS/Shunts: No atrial level shunt detected by color flow Doppler.  LEFT VENTRICLE PLAX 2D LVIDd:         5.00 cm   Diastology LVIDs:         3.60 cm   LV e' medial:    7.72 cm/s LV PW:         1.00 cm   LV E/e' medial:  9.5 LV IVS:        0.80 cm   LV e' lateral:   12.80 cm/s LVOT diam:     2.40 cm   LV E/e' lateral: 5.7 LV SV:         74 LV SV Index:   34 LVOT Area:     4.52 cm  RIGHT VENTRICLE             IVC RV S prime:     14.80 cm/s  IVC diam: 2.80 cm TAPSE (M-mode): 2.9 cm LEFT ATRIUM             Index        RIGHT ATRIUM           Index LA diam:        3.60 cm 1.65 cm/m   RA Area:     15.90 cm LA Vol (A2C):   28.8 ml 13.17 ml/m  RA Volume:   41.60 ml  19.03 ml/m LA Vol (A4C):   20.3 ml 9.29 ml/m LA Biplane Vol: 24.3 ml 11.11 ml/m  AORTIC VALVE AV Area (Vmax):    2.08 cm AV Area (Vmean):   2.16 cm AV Area (VTI):     2.25 cm AV Vmax:           224.50 cm/s AV Vmean:          144.500 cm/s AV VTI:            0.329 m AV Peak Grad:      20.2 mmHg AV Mean Grad:      10.0 mmHg LVOT Vmax:         103.33 cm/s LVOT Vmean:  69.033 cm/s LVOT VTI:          0.163 m LVOT/AV VTI ratio: 0.50  AORTA Ao Root diam: 3.40 cm Ao Asc diam:  3.40 cm MITRAL VALVE               TRICUSPID VALVE MV Area (PHT): 4.21 cm    TR Peak grad:   11.6 mmHg MV Decel Time: 180 msec    TR Vmax:        170.00 cm/s MV E velocity: 73.50 cm/s MV A velocity: 72.70 cm/s  SHUNTS MV E/A ratio:  1.01        Systemic VTI:  0.16  m                            Systemic Diam: 2.40 cm Clearnce Hasten Electronically signed by Clearnce Hasten Signature Date/Time: 06/28/2023/2:33:49 PM    Final    Overnight EEG with video Result Date: 06/28/2023 Charlsie Quest, MD     06/29/2023  9:12 AM Patient Name: BARRY LACINA MRN: [id number] Epilepsy Attending: Charlsie Quest Referring Physician/Provider: Lynnae January, NP Duration: 06/27/2023 2356 to 06/29/2023 0200 Patient history: 63yo M with ams getting eeg to evaluate for seizure Level of alertness: comatose AEDs during EEG study: Propofol Technical aspects: This EEG study was done with scalp electrodes positioned according to the 10-20 International system of electrode placement. Electrical activity was reviewed with band pass filter of 1-70Hz , sensitivity of 7 uV/mm, display speed of 59mm/sec with a 60Hz  notched filter applied as appropriate. EEG data were recorded continuously and digitally stored.  Video monitoring was available and reviewed as appropriate. Description: EEG showed continuous generalized 3 to 6 Hz theta-delta slowing. Hyperventilation and photic stimulation were not performed.   EEG was disconnected between 06/28/2023 1436 to 1639 ABNORMALITY - Continuous slow, generalized IMPRESSION: This study is suggestive of severe diffuse encephalopathy. No seizures or epileptiform discharges were seen throughout the recording. Charlsie Quest   DG Chest Port 1 View Result Date: 06/28/2023 CLINICAL DATA:  [id number]. Encounter for pneumonia. Ventilator dependent respiratory failure. EXAM: PORTABLE CHEST 1 VIEW COMPARISON:  Chest CT without contrast yesterday at 7:51 p.m. FINDINGS: 3:56 a.m. ETT tip is 5 cm from the carina, NGT curves around to the left in the stomach with the tip in the upper medial lumen. Right IJ central line tip at the superior cavoatrial junction. Widespread right-greater-than-left airspace disease is again noted, with relative sparing in the left upper lung field. No  pleural effusion is seen. There is mild cardiomegaly, mild central vascular prominence. Stable mediastinum. There is aortic atherosclerosis.  No acute or new osseous findings. IMPRESSION: 1. Widespread right-greater-than-left airspace disease, with relative sparing in the left upper lung field. No new or worsening infiltrate. 2. Mild cardiomegaly with mild central vascular prominence. 3. Support apparatus as above. 4. Aortic atherosclerosis. Electronically Signed   By: Almira Bar M.D.   On: 06/28/2023 05:58   MR BRAIN WO CONTRAST Result Date: 06/27/2023 CLINICAL DATA:  Initial evaluation for acute neuro deficit, stroke suspected. EXAM: MRI HEAD WITHOUT CONTRAST TECHNIQUE: Multiplanar, multiecho pulse sequences of the brain and surrounding structures were obtained without intravenous contrast. COMPARISON:  Prior study from earlier the same day. FINDINGS: Brain: Age-related cerebral atrophy. Patchy and confluent T2/FLAIR hyperintensity involving the periventricular deep white matter both cerebral hemispheres, consistent with chronic small vessel ischemic disease, moderately advanced. Patchy involvement of the pons. Encephalomalacia and gliosis involving the  right occipital lobe, consistent with a chronic left PCA distribution infarct. Associated chronic hemosiderin staining within this region. Superimposed restricted diffusion within the right temporal occipital region, consistent with an acute on chronic right PCA distribution infarct. Patchy involvement of the right thalamus. No associated hemorrhage or significant regional mass effect. No other evidence for acute or subacute ischemia. No acute intracranial hemorrhage. Chronic microhemorrhage at the anterior right temporal lobe noted. No mass lesion, midline shift or mass effect. No hydrocephalus or extra-axial fluid collection. Pituitary gland suprasellar region within normal limits. Vascular: Major intracranial vascular flow voids are maintained. Skull and  upper cervical spine: Cranial junctional is. Postoperative changes partially visualize within the cervical spine. Bone marrow signal intensity normal. No scalp soft tissue abnormality. Sinuses/Orbits: Globes orbital soft tissues within normal limits. Mucosal thickening noted about the sphenoid ethmoidal and maxillary sinuses. Superimposed air-fluid level within the right maxillary sinus. Small bilateral mastoid effusions. Patient is intubated. Other: None. IMPRESSION: 1. 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 No associated hemorrhage or significant regional mass effect. 2. Underlying age-related cerebral atrophy with moderate chronic microvascular ischemic disease. Electronically Signed   By: Rise Mu M.D.   On: 06/27/2023 21:28   CT CHEST ABDOMEN PELVIS WO CONTRAST Result Date: 06/27/2023 CLINICAL DATA:  Pneumonia, sepsis EXAM: CT CHEST, ABDOMEN AND PELVIS WITHOUT CONTRAST TECHNIQUE: Multidetector CT imaging of the chest, abdomen and pelvis was performed following the standard protocol without IV contrast. RADIATION DOSE REDUCTION: This exam was performed according to the departmental dose-optimization program which includes automated exposure control, adjustment of the mA and/or kV according to patient size and/or use of iterative reconstruction technique. COMPARISON:  CT abdomen and pelvis 02/08/2023. FINDINGS: CT CHEST FINDINGS Cardiovascular: Heart is normal size. Aorta is normal caliber. Moderate coronary artery and aortic calcifications. Mediastinum/Nodes: No mediastinal, hilar, or axillary adenopathy. Trachea and esophagus are unremarkable. Endotracheal tube tip in the midtrachea. Thyroid unremarkable. Lungs/Pleura: Dense consolidation in both lower lobes compatible with pneumonia. No effusions. Patchy ground-glass opacities in the upper lobes as well. Musculoskeletal: Chest wall soft tissues are unremarkable. No acute bony abnormality. CT  ABDOMEN PELVIS FINDINGS Hepatobiliary: Small layering gallstones within the gallbladder. No focal hepatic abnormality or biliary ductal dilatation. Pancreas: No focal abnormality or ductal dilatation. Spleen: No focal abnormality.  Normal size. Adrenals/Urinary Tract: Foley catheter in the bladder which is decompressed. No renal or adrenal mass. No stones or hydronephrosis. Stomach/Bowel: Prior colectomy. Right lower quadrant ostomy noted. No bowel obstruction. NG tube is in the stomach. Vascular/Lymphatic: Aortic atherosclerosis. No evidence of aneurysm or adenopathy. Reproductive: Mildly prominent prostate. Other: No free fluid or free air. Musculoskeletal: No acute bony abnormality. IMPRESSION: Dense consolidation in both lower lobes with patchy opacities in the upper lobes. Findings compatible with multifocal pneumonia. Coronary artery disease, aortic atherosclerosis. Right lower quadrant ostomy.  No evidence of bowel obstruction. Cholelithiasis.  No CT evidence for acute cholecystitis. Electronically Signed   By: Charlett Nose M.D.   On: 06/27/2023 20:01   DG CHEST PORT 1 VIEW Result Date: 06/27/2023 CLINICAL DATA:  Central line placement. EXAM: PORTABLE CHEST 1 VIEW COMPARISON:  Same day. FINDINGS: Endotracheal and nasogastric tubes are unchanged. Interval placement of right internal jugular catheter with distal tip in expected position of the SVC. No pneumothorax is noted. Stable diffuse right lung interstitial opacity is noted concerning for asymmetric edema or atypical inflammation. IMPRESSION: Interval placement of right internal jugular catheter with distal tip in expected position of the  SVC. Electronically Signed   By: Lupita Raider M.D.   On: 06/27/2023 12:40   CT HEAD WO CONTRAST ( ) Result Date: 06/27/2023 CLINICAL DATA:  Altered mental status. EXAM: CT HEAD WITHOUT CONTRAST TECHNIQUE: Contiguous axial images were obtained from the base of the skull through the vertex without intravenous  contrast. RADIATION DOSE REDUCTION: This exam was performed according to the departmental dose-optimization program which includes automated exposure control, adjustment of the mA and/or kV according to patient size and/or use of iterative reconstruction technique. COMPARISON:  CT head dated February 08, 2023. FINDINGS: Brain: New loss of the normal gray-white matter differentiation along the inferior right temporal lobe (series 3, image 14; series 5, images 35-42). No evidence of hemorrhage, hydrocephalus, or extra-axial collection. Old right occipital lobe infarct again noted. Vascular: Calcified atherosclerosis at the skull base. No hyperdense vessel. Skull: Normal. Negative for fracture or focal lesion. Sinuses/Orbits: No acute finding. Other: Similar postsurgical changes right pre maxillary soft tissues. IMPRESSION: 1. New loss of the normal gray-white matter differentiation along the inferior right temporal lobe, concerning for acute infarct. No hemorrhage. Consider further evaluation with MRI. 2. Old right occipital lobe infarct. Electronically Signed   By: Obie Dredge M.D.   On: 06/27/2023 11:00   DG Chest Port 1 View Result Date: 06/27/2023 CLINICAL DATA:  Ventilator dependent respiratory failure. [id number] encounter for orogastric tube placement. EXAM: PORTABLE ABDOMEN - 1 VIEW; PORTABLE CHEST - 1 VIEW COMPARISON:  Portable chest today at 6:10 a.m., abdomen and pelvis CT 02/08/2023 FINDINGS: Chest AP portable 6:48 a.m.: Interval intubation with tip of the ETT 4.6 cm from carina. NGT interval insertion. The heart is enlarged. There is worsening central vascular congestion and perihilar edema. There is increased right-greater-than-left perihilar interstitial Madilyn Fireman most likely due to ground-glass edema. The more peripheral lungs are essentially clear. Small pleural effusions are forming. The mediastinum is stable.  No new osseous abnormality. Flat plate single anteverted view, portable at 6:51 a.m.: NGT  curves to the left and cephalad within the stomach with the tip along the proximal fundus. Compared with CT 02/08/2023 there is severe dilatation of the stomach with air and food products. Correlate clinically for gastric outlet obstruction or impaired gastric emptying. The bowel pattern appears nonobstructive. There is no supine evidence of free air. No other significant radiographic findings. IMPRESSION: 1. Interval intubation with tip of the ETT 4.6 cm from carina. 2. NGT curves to the left and cephalad within the stomach with the tip along the proximal fundus. 3. Compared with CT 02/08/2023 there is severe dilatation of the stomach with air and food products. Correlate clinically for gastric outlet obstruction or impaired gastric emptying. 4. Worsening central vascular congestion and perihilar edema. 5. Developing small pleural effusions. Electronically Signed   By: Almira Bar M.D.   On: 06/27/2023 07:27   DG Abd Portable 1V Result Date: 06/27/2023 CLINICAL DATA:  Ventilator dependent respiratory failure. [id number] encounter for orogastric tube placement. EXAM: PORTABLE ABDOMEN - 1 VIEW; PORTABLE CHEST - 1 VIEW COMPARISON:  Portable chest today at 6:10 a.m., abdomen and pelvis CT 02/08/2023 FINDINGS: Chest AP portable 6:48 a.m.: Interval intubation with tip of the ETT 4.6 cm from carina. NGT interval insertion. The heart is enlarged. There is worsening central vascular congestion and perihilar edema. There is increased right-greater-than-left perihilar interstitial Madilyn Fireman most likely due to ground-glass edema. The more peripheral lungs are essentially clear. Small pleural effusions are forming. The mediastinum is stable.  No new osseous abnormality. Flat plate  single anteverted view, portable at 6:51 a.m.: NGT curves to the left and cephalad within the stomach with the tip along the proximal fundus. Compared with CT 02/08/2023 there is severe dilatation of the stomach with air and food products. Correlate  clinically for gastric outlet obstruction or impaired gastric emptying. The bowel pattern appears nonobstructive. There is no supine evidence of free air. No other significant radiographic findings. IMPRESSION: 1. Interval intubation with tip of the ETT 4.6 cm from carina. 2. NGT curves to the left and cephalad within the stomach with the tip along the proximal fundus. 3. Compared with CT 02/08/2023 there is severe dilatation of the stomach with air and food products. Correlate clinically for gastric outlet obstruction or impaired gastric emptying. 4. Worsening central vascular congestion and perihilar edema. 5. Developing small pleural effusions. Electronically Signed   By: Almira Bar M.D.   On: 06/27/2023 07:27   DG Chest Port 1 View Result Date: 06/27/2023 CLINICAL DATA:  Unresponsive. EXAM: PORTABLE CHEST 1 VIEW COMPARISON:  02/08/2023 FINDINGS: The cardio pericardial silhouette is enlarged. Vascular congestion diffuse noted with diffuse interstitial opacity suggesting edema. No focal consolidation. No pneumothorax or pleural effusion. Gaseous distention of stomach or bowel is seen in the the left hemidiaphragm. Telemetry leads overlie the chest. IMPRESSION: Enlargement of the cardiopericardial silhouette with vascular congestion and diffuse interstitial opacity suggesting edema. Gaseous distention of stomach or bowel noted under the left hemidiaphragm. Electronically Signed   By: Kennith Center M.D.   On: 06/27/2023 06:32     PHYSICAL EXAM  Temp:  [97.6 F (36.4 C)-99.8 F (37.7 C)] 99.8 F (37.7 C) (01/17 1127) Pulse Rate:  [55-93] 91 (01/17 1200) Resp:  [20-57] 35 (01/17 1200) BP: (106-202)/(55-98) 191/83 (01/17 1200) SpO2:  [87 %-100 %] 99 % (01/17 1200) FiO2 (%):  [40 %] 40 % (01/17 0807) Weight:  [87.4 kg] 87.4 kg (01/17 0430)  General - Well nourished, well developed, intubated on sedation.  Ophthalmologic - fundi not visualized due to noncooperation.  Cardiovascular - Regular  rate and rhythm  Neuro - intubated not sedated., eyes closed, opens eyes and is following most commands.  Right gaze preference but able to look to the left past midline.  Blinks to threat more on the right than left.  Face is symmetric.  Tongue midline.  Able to move all 4 extremities against gravity to command but moves right side more than the left.  ASSESSMENT/PLAN Mr. CHA PRESS is a 63 y.o. male with history of DM, HTN, HLD, left eye blind, right occipital infarct with left HH, mesenteric ischemia s/p colostomy, RLE AKA, wheelchair bound living in SNF admitted for AMS, obtundation. He was intubated for nausea vomiting and not able to protect airway. Found to have high grade fever and in septic shock. No TNK given due to time onset unclear.    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 Old right occipital lobe infarct.  MRI  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 involving the right temporal occipital region. Transcranial Doppler for history of mild right PCA stenosis and elevated pulsatility and IC suggest diffuse intracranial atherosclerosis Carotid Doppler highly limited and only both ECAs could be started 2D Echo   ejection fraction 65 to 70% with hyperdynamic LV function.  Left atrium size normal  LDL unable to calculate due to elevated triglycerides 521  HgbA1c 6.4 UDS neg Heparin subq for VTE prophylaxis aspirin 81 mg daily prior to admission, now on aspirin 81 mg daily.  Ongoing aggressive stroke risk factor management Therapy recommendations:  pending Disposition:   as per  daughter, full code for now, palliative care on board  Sepsis/septic shock B/l [place]  UTI CT chest Dense consolidation in both lower lobes with patchy opacities in the  upper lobes. Findings compatible with multifocal pneumonia. UA WBC > 50 On zosyn, vanco and azithromycin  Need to rule out meningitis/encephalitis LP failed at the bedside and with IR fluoroscopy as well.  On pressor support CCM on board  Respiratory failure Intubated on vent On sedation and pressor support Vent management per CCM  Hx of stroke 09/2022 subacute right occipital infarct due to right PCA occlusion with severe vasculopathy. MRA occlusion of the distal right P1/proximal right P2 segment, without evidence of reconstitution of the right PCA branches. No DVT, EF 55-60%, A1C 6.9 and THC positive in urine. Put on ASA 81 but no plavix due to GIB and chronic anemia. Also on statin and tricor  Followed at [facility] in 11/2022  Diabetes HgbA1c 6.4 goal < 7.0 Controlled CBG monitoring SSI DM education and close PCP follow up  Hx of hypertension hypotension Low BP on presentation consistent with septic shock On pressure now Stable with pressure Stroke likely due to hypotension in the setting of right PCA occlusion Long term BP goal normotensive  Hyperlipidemia Home meds:  lipitor 80  LDL 29, goal < 70 Now on lipitor 80 Continue statin at discharge  Tobacco abuse Current smoker Smoking cessation counseling will be provided  Other Stroke Risk Factors PVD with RLE AKA Hx of substance abuse - THC  Other Active Problems Left eye blind since 2013 Mesenteric ischemia s/p colostomy 12/2022 with septic shock Full code for now  Hospital day # 8 Patient neurological exam suggest only mild left-sided vision loss and weakness when examined today with sedation off.  He has significant medical comorbidities so prognosis is limited overall.  CCM team met with daughter yesterday who wanted to wait a few more days before she made decisions about goals of care.  Continue weaning of ventilatory support and extubate as tolerated.  Family needs to make decision about one-way extubation versus  trach PEG and full support prior to extubation.  Continue aspirin for stroke prevention and maintain aggressive risk factor modification.  Discussed with Dr. Ned Card critical care medicine.  Stroke team will sign off.  Kindly call for questions.   This patient is critically ill and at significant risk of neurological worsening, death and care requires constant monitoring of vital signs, hemodynamics,respiratory and cardiac monitoring, extensive review of multiple databases, frequent neurological assessment, discussion with family, other specialists and medical decision making of high complexity.I have made any additions or clarifications directly to the above note.This critical care time does not reflect procedure time, or teaching time or supervisory time of PA/NP/Med Resident etc but could involve care discussion time.  I spent 30 minutes of neurocritical care time  in the care of  this patient.                 Delia Heady, MD Stroke Neurology 07/05/2023 1:13 PM    To contact Stroke Continuity provider, please refer to [email or web address]. After hours, contact General Neurology

## 2023-07-06 ENCOUNTER — Inpatient Hospital Stay (HOSPITAL_COMMUNITY): Payer: Medicaid Other

## 2023-07-06 LAB — BASIC METABOLIC PANEL
Anion gap: 7 (ref 5–15)
BUN: 102 mg/dL — ABNORMAL HIGH (ref 8–23)
CO2: 20 mmol/L — ABNORMAL LOW (ref 22–32)
Calcium: 9.2 mg/dL (ref 8.9–10.3)
Chloride: 118 mmol/L — ABNORMAL HIGH (ref 98–111)
Creatinine, Ser: 1.72 mg/dL — ABNORMAL HIGH (ref 0.61–1.24)
GFR, Estimated: 44 mL/min — ABNORMAL LOW (ref >60)
Glucose, Bld: 236 mg/dL — ABNORMAL HIGH (ref 70–99)
Potassium: 5.4 mmol/L — ABNORMAL HIGH (ref 3.5–5.1)
Sodium: 145 mmol/L (ref 135–145)

## 2023-07-06 LAB — GLUCOSE, CAPILLARY
Glucose-Capillary: 203 mg/dL — ABNORMAL HIGH (ref 70–99)
Glucose-Capillary: 172 mg/dL — ABNORMAL HIGH (ref 70–99)
Glucose-Capillary: 148 mg/dL — ABNORMAL HIGH (ref 70–99)
Glucose-Capillary: 175 mg/dL — ABNORMAL HIGH (ref 70–99)
Glucose-Capillary: 190 mg/dL — ABNORMAL HIGH (ref 70–99)

## 2023-07-06 LAB — MAGNESIUM: Magnesium: 2.2 mg/dL (ref 1.7–2.4)

## 2023-07-06 LAB — PHOSPHORUS: Phosphorus: 4.9 mg/dL — ABNORMAL HIGH (ref 2.5–4.6)

## 2023-07-06 MED ORDER — FENTANYL BOLUS VIA INFUSION: 50.0000 ug | INTRAVENOUS | Status: DC | PRN

## 2023-07-06 MED ORDER — SODIUM BICARBONATE 8.4 % IV SOLN
50.0000 meq | Freq: Once | INTRAVENOUS | Status: AC
  Administered 2023-07-06: 50 meq via INTRAVENOUS
  Filled 2023-07-06: qty 50

## 2023-07-06 MED ORDER — FUROSEMIDE 10 MG/ML IJ SOLN
80.0000 mg | Freq: Once | INTRAMUSCULAR | Status: AC
  Administered 2023-07-06: 80 mg via INTRAVENOUS
  Filled 2023-07-06: qty 8

## 2023-07-06 MED ORDER — OXYCODONE HCL 5 MG PO TABS
10.0000 mg | ORAL_TABLET | Freq: Four times a day (QID) | ORAL | Status: DC
  Administered 2023-07-06 – 2023-07-08 (×8): 10 mg
  Filled 2023-07-06 (×8): qty 2

## 2023-07-06 NOTE — Progress Notes (Addendum)
NAME:  Gregory [name], MRN:  [id number], DOB:  1961-03-18, LOS: 9 ADMISSION DATE:  06/27/2023, CONSULTATION DATE:  06/27/23 REFERRING MD:  EDP, CHIEF COMPLAINT:  AMS   History of Present Illness:  63 year old male with significant past medical history including diabetes, hypertension, hyperlipidemia, CKDIIIb, COPD, chronic osteomyelitis s/p R AKA, L midfoot amputation, atrial fibrillation, HFpEF, mesenteric ischemia s/p colostomy 12/2022 who presented to ED from SNF for altered mental status/obtunded. Reported increased liquid stool from ostomy. He was subsequently intubated in the ED. Labs in ED notable for K 5.8, Cr 2.6, WBC 13, trop 83>705, BNP 63, UA large leuk, wbc21-50 few bacteria, UDS negative. Admitted to PCCM.   Pertinent  Medical History  COPD, CKD3b, mesenteric ischemia s/p colostomy 12/2022, T2DM, HFpEF, HTN, HLD   Significant Hospital Events: Including procedures, antibiotic start and stop dates in addition to other pertinent events   1/9: admit from ED for AMS, intubated, CVC 1/10: MRI w/ 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 Stroke team/neuro consulted. Recommend stroke work up, d/c meningitis/encephalitis coverage, palliative consult.  1/10 unsuccessful attempts at lumbar puncture 1/12 significant ventilator dyssynchrony, given 1 dose of Nimbex, on Versed drip 1/13 agitated overnight despite fentanyl and versed pushes and Haldol 1/14 remains agitated with sedation wean  1/16 off versed gtt, responds to painful stimuli but not following commands 1/17 awakes more easily, moves all extremities and follows simple commands   Interim History / Subjective:  Required PRN meds overnight d/t agitation. Sedated this morning on precedex/fentanyl. Once sedation turned off the patient was more awake and able to follow commands. He is having pain/discomfort with ETT.   Objective   Blood pressure (!) 107/54, pulse (!) 59, temperature 98.4 F (36.9 C), temperature  source Oral, resp. rate (!) 28, height 6' 0.01" (1.829 m), weight 87.4 kg, SpO2 100%.    Vent Mode: PRVC FiO2 (%):  [40 %] 40 % Set Rate:  [28 bmp] 28 bmp Vt Set:  [600 mL] 600 mL PEEP:  [5 cmH20] 5 cmH20 Pressure Support:  [8 cmH20] 8 cmH20 Plateau Pressure:  [17 cmH20-21 cmH20] 17 cmH20   Intake/Output Summary (Last 24 hours) at 07/06/2023 4742 Last data filed at 07/06/2023 0600 Gross per 24 hour  Intake 2812.98 ml  Output 3300 ml  Net -487.02 ml   Filed Weights   07/03/23 0411 07/04/23 0133 07/05/23 0430  Weight: 92.8 kg 85.7 kg 87.4 kg    Examination: General: chronically ill appearing, appears uncomfortable on vent, but not in acute distress HENT: OG tube, ET tube in place Lungs: coarse breath sounds bilaterally Cardiovascular: regular rate, rhythm Abdomen: colostomy present w stool output, soft abdomen Extremities: R AKA, L midfoot amputation; left leg with edema and scabs Neuro: moves all extremities spontaneously, able to follow simple commands  GU: foley in place  Na 145 K 5.4 Bicarb 20 Cr 1.72, GFR 44 Phos 4.9  Resolved Hospital Problem list   Thrombocytopenia  Assessment & Plan:   Septic shock 2/2 ESBL UTI and multifocal pneumonia Rhinovirus positive - off pressors - continue cefepime for total 7 day course, started 1/13 - completed course of meropenem for urine - trach aspirate growing klebsiella and serratia   Acute hypoxemic respiratory failure 2/2 multifocal pneumonia COPD Vent settings: PRVC, RR 28, TV 600, PEEP 5, FiO2 40%  - cefepime as above - wean sedation - tolerated PS for a few hours yesterday; will do 2 hr SBT today and extubate if he tolerates this -  VAP protocol, stress ulcer prophylaxis  - Brovana, Pulmicort, Yupelri  Encephalopathy/Agitation - LP unsuccessful - Weaning sedation as above - Continue seroquel 100 mg at bedtime, klonopin 2 mg TID, and lyrica 125 mg TID   Chronic pain - On oxycontin 15 mg BID and oxycodone 5 mg q6h  prn at home - oxycodone to 20 mg q6h per tube   AKI on CKD IIIb - Cr slowly rising at 1.72 - 2.8 L UOP in the last 24 hrs - maintain renal perfusion - avoid nephrotoxic meds - replete electrolytes prn  Hypernatremia, improved  - Na 145 today - free water to 150 mL q2h   Normocytic anemia - Hb stable at 8.5 (1u prbc on 1/13) - [place] Hb >7  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  Order written to d/c  Foley:  Yes, and it is no longer needed Code Status:  full code Last date of multidisciplinary goals of care discussion Chandra Batch, updated at bedside 1/17]  Labs   CBC: Recent Labs  Lab 07/01/23 0542 07/02/23 0448 07/03/23 0405 07/04/23 0451 07/05/23 1043  WBC 4.2 5.9 7.5 6.2 7.0  NEUTROABS  --   --  3.7  --   --   HGB 6.9* 8.6* 8.5* 8.3* 9.0*  HCT 21.3* 26.6* 27.3* 26.6* 29.2*  MCV 92.2 89.0 91.3 93.0 92.1  PLT 100* 135* 151 155 166    Basic Metabolic Panel: Recent Labs  Lab 06/30/23 0358 07/01/23 0542 07/02/23 0448 07/03/23 0405 07/04/23 0451 07/05/23 1043 07/06/23 0251  NA 140 145 146* 149* 147* 145 145  K 4.5 4.1 4.1 4.7 4.9 5.3* 5.4*  CL 109 117* 117* 117* 115* 118* 118*  CO2 21* 22 20* 24 21* 21* 20*  GLUCOSE 117* 180* 183* 101* 205* 268* 236*  BUN 52* 47* 50* 69* 93* 91* 102*  CREATININE 2.08* 1.61* 1.37* 1.50* 1.69* 1.64* 1.72*  CALCIUM 9.1 8.6* 9.1 9.1 9.0 9.1 9.2  MG 2.2  2.0 1.7 1.9  --   --  2.2  PHOS 3.9 2.9 2.4* 4.0  --   --  4.9*   GFR: Estimated Creatinine Clearance: 47.6 mL/min (A) (by C-G formula based on SCr of 1.72 mg/dL (H)). Recent Labs  Lab 07/02/23 0448 07/03/23 0405 07/04/23 0451 07/05/23 1043  WBC 5.9 7.5 6.2 7.0    Liver Function Tests: No results for input(s): "AST", "ALT", "ALKPHOS", "BILITOT", "PROT", "ALBUMIN" in the last 168 hours.  No results for input(s): "LIPASE", "AMYLASE" in the last 168 hours. No results for input(s): "AMMONIA" in the last 168 hours.   ABG    Component Value Date/Time   PHART 7.211 (L) 06/28/2023 0803   PCO2ART 51.2 (H) 06/28/2023 0803   PO2ART 99 06/28/2023 0803   HCO3 20.4 06/28/2023 0803   TCO2 22 06/28/2023 0803   ACIDBASEDEF 7.0 (H) 06/28/2023 0803   O2SAT 96 06/28/2023 0803  Coagulation Profile: No results for input(s): "INR", "PROTIME" in the last 168 hours.   Cardiac Enzymes: No results for input(s): "CKTOTAL", "CKMB", "CKMBINDEX", "TROPONINI" in the last 168 hours.  HbA1C: Hemoglobin A1C  Date/Time Value Ref Range Status  08/14/2016 12:00 AM 5.6  Final  01/18/2016 12:00 AM 5.5  Final   Hgb A1c MFr Bld  Date/Time Value Ref Range Status  06/27/2023 09:25 AM 6.4 (H) 4.8 - 5.6 % Final    Comment:    (NOTE) Pre diabetes:          5.7%-6.4%  Diabetes:              >6.4%  Glycemic control for   <7.0% adults with diabetes   12/03/2022 04:54 PM 6.9 (H) 4.8 - 5.6 % Final    Comment:    (NOTE) Pre diabetes:          5.7%-6.4%  Diabetes:              >6.4%  Glycemic control for   <7.0% adults with diabetes     CBG: Recent Labs  Lab 07/05/23 1131 07/05/23 1547 07/05/23 1919 07/05/23 2332 07/06/23 0314  GLUCAP 170* 235* 188* 186* 203*    Review of Systems:   Unable to obtain  Past Medical History:  He,  has a past medical history of Acquired absence of left great toe (HCC), Acquired absence of right leg below knee (HCC), Acute osteomyelitis of calcaneum,  right (HCC) (10/02/2016), Acute respiratory failure (HCC), Amputation stump infection (HCC) (08/12/2018), Anemia, Basal cell carcinoma, eyelid, Cancer (HCC), Candidiasis (08/12/2018), CHF (congestive heart failure) (HCC), Chronic back pain, Chronic kidney disease, Chronic multifocal osteomyelitis (HCC), Chronic pain syndrome, COPD (chronic obstructive pulmonary disease) (HCC), DDD (degenerative disc disease), lumbar, Depression, Diabetes mellitus without complication (HCC), Diabetic ulcer of toe of left foot (HCC) (10/02/2016), Difficult intubation, Difficulty in walking, not elsewhere classified, 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), Foot drop, bilateral, Foot drop, right (12/27/2015), GERD (gastroesophageal reflux disease), Herpesviral vesicular dermatitis, History of COVID-19 (03/15/2022), Hyperlipidemia, Hyperlipidemia, Hypertension, Insomnia, Malignant melanoma of other parts of face (HCC), Melanoma (HCC), MRSA bacteremia, Muscle weakness (generalized), Nasal congestion, Necrosis of toe (HCC) (07/08/2018), Neuromuscular dysfunction of bladder, Osteomyelitis (HCC), Osteomyelitis (HCC), Osteomyelitis (HCC), Other idiopathic peripheral autonomic neuropathy, Retention of urine, unspecified, Squamous cell carcinoma of skin, Stroke (HCC), Unsteadiness on feet, Urinary retention, Urinary retention, Urinary tract infection, Wears dentures, and Wears glasses.   Surgical History:   Past Surgical History:  Procedure Laterality Date   AMPUTATION Left 03/29/2017   Procedure: LEFT GREAT TOE AMPUTATION AT METATARSOPHALANGEAL JOINT;  Surgeon: Nadara Mustard, MD;  Location: [facility] OR;  Service: Orthopedics;  Laterality: Left;   AMPUTATION Right 03/29/2017   Procedure: RIGHT BELOW KNEE AMPUTATION;  Surgeon: Nadara Mustard, MD;  Location: [facility] OR;  Service: Orthopedics;  Laterality: Right;   AMPUTATION Left 06/06/2018   Procedure: LEFT 2ND TOE AMPUTATION;  Surgeon: Nadara Mustard, MD;  Location: [facility] OR;  Service: Orthopedics;   Laterality: Left;   AMPUTATION Right 11/19/2018   Procedure: AMPUTATION ABOVE KNEE;  Surgeon: Nadara Mustard, MD;  Location: [facility] OR;  Service: Orthopedics;  Laterality: Right;   ANTERIOR CERVICAL CORPECTOMY N/A 11/25/2015   Procedure: ANTERIOR CERVICAL FIVE CORPECTOMY Cervical four - six fusion;  Surgeon: Lisbeth Renshaw, MD;  Location: [facility];  Service: Neurosurgery;  Laterality: N/A;  ANTERIOR CERVICAL FIVE CORPECTOMY Cervical four - six fusion   APPLICATION OF WOUND VAC Right 10/22/2018   Procedure: Application Of Wound Vac;  Surgeon: Nadara Mustard, MD;  Location: [facility] OR;  Service: Orthopedics;  Laterality: Right;   CYSTOSCOPY WITH BIOPSY N/A 05/01/2022   Procedure: CYSTOSCOPY WITH URETHRAL BIOPSY;  Surgeon: Noel Christmas, MD;  Location: [facility] ORS;  Service: Urology;  Laterality: N/A;  1 HR   CYSTOSCOPY WITH RETROGRADE URETHROGRAM N/A 04/25/2018   Procedure: CYSTOSCOPY WITH RETROGRADE URETHROGRAM/ BALLOON DILATION;  Surgeon: Ihor Gully, MD;  Location: [facility] ORS;  Service: Urology;  Laterality: N/A;   CYSTOSCOPY WITH URETHRAL DILATATION N/A 05/01/2022   Procedure: BALLOON DILATION WITH  OPTILUME;  Surgeon: Noel Christmas, MD;  Location: [facility] ORS;  Service: Urology;  Laterality: N/A;   LAPAROTOMY N/A 12/03/2022   Procedure: EXPLORATORY LAPAROTOMY  total abdominal colectomy with end ileostomy;  Surgeon: Kinsinger, De Blanch, MD;  Location: [facility] OR;  Service: General;  Laterality: N/A;   MULTIPLE TOOTH EXTRACTIONS     POSTERIOR CERVICAL FUSION/FORAMINOTOMY N/A 11/29/2015   Procedure: Cervical Three-Cervical Seven Posterior Cervical Laminectomy with Fusion;  Surgeon: Lisbeth Renshaw, MD;  Location: [facility];  Service: Neurosurgery;  Laterality: N/A;  Cervical Three-Cervical Seven Posterior Cervical Laminectomy with Fusion   STUMP REVISION Right 10/22/2018   Procedure: REVISION RIGHT BELOW KNEE AMPUTATION;  Surgeon: Nadara Mustard, MD;  Location: [facility] OR;  Service: Orthopedics;  Laterality: Right;    STUMP REVISION Right 12/05/2018   Procedure: Revision Right Above Knee Amputation;  Surgeon: Nadara Mustard, MD;  Location: [facility] OR;  Service: Orthopedics;  Laterality: Right;   STUMP REVISION Right 05/29/2019   Procedure: REVISION RIGHT ABOVE KNEE AMPUTATION;  Surgeon: Nadara Mustard, MD;  Location: [facility] OR;  Service: Orthopedics;  Laterality: Right;   STUMP REVISION Right 06/24/2019   Procedure: STUMP REVISION;  Surgeon: Nadara Mustard, MD;  Location: [facility] OR;  Service: Orthopedics;  Laterality: Right;     Social History:   reports that he has been smoking cigarettes. He has never used smokeless tobacco. He reports that he does not drink alcohol and does not use drugs.   Family History:  His family history includes Bone cancer in his father; Cancer in his father, mother, paternal grandfather, and another family member; Diabetes in his mother and another family member; Heart disease in his mother; Prostate cancer in his father.   Allergies Allergies  Allergen Reactions   Latex Other (See Comments)    "ALLERGIC," per [facility]     Home Medications  Prior to Admission medications   Medication Sig Start Date End Date Taking? Authorizing Provider  acetaminophen (TYLENOL) 500 MG tablet Take 1,000 mg by mouth in the morning and at bedtime.   Yes [provider]  aspirin EC 81 MG tablet Take 81 mg by mouth daily. Swallow whole.   Yes [provider]  atorvastatin (LIPITOR) 80 MG tablet Take 1 tablet (80 mg total) by mouth daily. Patient taking differently: Take 80 mg by mouth at bedtime. 09/22/22 01/27/24 Yes Jerald Kief, MD  carvedilol (COREG) 12.5 MG tablet Take 1 tablet (12.5 mg total) by mouth 2 (two) times daily with a meal. 12/25/22  Yes Dorcas Carrow, MD  Cholecalciferol 25 MCG (1000 UT) capsule Take 2,000 Units by mouth daily.   Yes [provider]  Cranberry 450 MG CAPS Take 450 mg by mouth daily.   Yes [provider]  cyclobenzaprine (FLEXERIL) 5 MG tablet  Take 5 mg by mouth every 8 (eight) hours as needed for muscle spasms.   Yes [provider]  Dextran 70-Hypromellose (NATURAL  BALANCE TEARS) 0.1-0.3 % SOLN Place 1 drop into both eyes every 4 (four) hours as needed (dry eyes).   Yes [provider]  escitalopram (LEXAPRO) 20 MG tablet Take 20 mg by mouth daily.   Yes [provider]  ferrous sulfate 325 (65 FE) MG tablet Take 1 tablet (325 mg total) by mouth 3 (three) times daily with meals. Patient taking differently: Take 325 mg by mouth daily. With meal. 01/23/23  Yes Cathren Laine, MD  fluticasone [facility]) 50 MCG/ACT nasal spray Place 1 spray into both nostrils daily.   Yes [provider]  furosemide (LASIX) 20 MG tablet Take 20-40 mg by mouth See admin instructions. Take 40 mg by mouth in the morning and 20 mg by mouth in the afternoon   Yes [provider]  haloperidol lactate (HALDOL) 5 MG/ML injection Inject 1 mg into the muscle every 6 (six) hours as needed (agitation/psychosis).   Yes [provider]  hydrALAZINE (APRESOLINE) 10 MG tablet Take 1 tablet (10 mg total) by mouth every 8 (eight) hours. 12/25/22  Yes Dorcas Carrow, MD  hydrOXYzine (VISTARIL) 50 MG capsule Take 50 mg by mouth at bedtime.   Yes [provider]  insulin glargine (LANTUS SOLOSTAR) 100 UNIT/ML Solostar Pen Inject 15 Units into the skin daily. Patient taking differently: Inject 15 Units into the skin at bedtime. 09/07/22  Yes Rai, Ripudeep K, MD  insulin lispro (HUMALOG) 100 UNIT/ML KwikPen Inject 0-10 Units into the skin See admin instructions. Inject 0-10 units into the skin three times a day and at bedtime, PER SLIDING SCALE:  BGL 151-200 = 2 units 201-250 = 4 units 251-300 = 6 units 301-350 =  8 units 351-400 = 10 units Patient taking differently: Inject 0-10 Units into the skin See admin instructions. Inject 0-10 units into the skin before meals and at bedtime, PER SLIDING SCALE:  0-150 = 0  units 151-200 = 2 units 201-250 = 4 units 251-300 = 6 units 301-350 =  8 units 351-400 = 10 units 01/31/22  Yes Dahal, Melina Schools, MD  ipratropium-albuterol (DUONEB) 0.5-2.5 (3) MG/3ML SOLN Take 3 mLs by nebulization every 6 (six) hours as needed (for shortness of breath).   Yes [provider]  LACTOBACILLUS PO Take 1 capsule by mouth in the morning and at bedtime.   Yes [provider]  lidocaine (LIDODERM) 5 % Place 2 patches onto the skin daily. Remove & Discard patch within 12 hours or as directed by MD Patient taking differently: Place 1 patch onto the skin in the morning and at bedtime. Take off after 12 hours 12/25/22  Yes Dorcas Carrow, MD  loperamide (IMODIUM) 2 MG capsule Take 1 capsule (2 mg total) by mouth 2 (two) times daily. 12/25/22  Yes Dorcas Carrow, MD  loratadine (CLARITIN) 10 MG tablet Take 10 mg by mouth in the morning.   Yes [provider]  magnesium oxide (MAG-OX) 400 (240 Mg) MG tablet Take 400 mg by mouth in the morning, at noon, and at bedtime.   Yes [provider]  Melatonin 10 MG TABS Take 10 mg by mouth at bedtime.   Yes [provider]  Meth-Hyo-M Bl-Na Phos-Ph Sal (URO-MP) 118 MG CAPS Take 118 mg by mouth 2 (two) times daily.   Yes [provider]  Multiple Vitamin (MULTIVITAMIN WITH MINERALS) TABS tablet Take 1 tablet by mouth daily. 12/25/22  Yes Dorcas Carrow, MD  naloxone [facility]) 4 MG/0.1ML LIQD nasal spray kit Place 0.4 mg into  the nose as needed (opiate OD).   Yes [provider]  omeprazole (PRILOSEC) 20 MG capsule Take 20 mg by mouth at bedtime.   Yes [provider]  ondansetron (ZOFRAN) 4 MG tablet Take 4 mg by mouth every 8 (eight) hours as needed for nausea or vomiting.   Yes [provider]  oxybutynin (DITROPAN-XL) 5 MG 24 hr tablet Take 5 mg by mouth in the morning and at bedtime.   Yes [provider]  oxyCODONE (OXY IR/ROXICODONE) 5 MG immediate release tablet Take  5 mg by mouth every 6 (six) hours as needed for moderate pain (pain score 4-6).   Yes [provider]  oxyCODONE (OXYCONTIN) 15 mg 12 hr tablet Take 15 mg by mouth in the morning, at noon, and at bedtime.   Yes [provider]  OXYGEN Inhale 2 L into the lungs as needed (to maintain sats above 90/SOB).   Yes [provider]  oxymetazoline (AFRIN) 0.05 % nasal spray Place 1 spray into both nostrils 2 (two) times daily as needed for congestion.   Yes [provider]  pregabalin (LYRICA) 100 MG capsule Take 1 capsule (100 mg total) by mouth 2 (two) times daily. Patient taking differently: Take 100 mg by mouth 3 (three) times daily. 12/25/22 06/27/23 Yes Dorcas Carrow, MD  pregabalin (LYRICA) 25 MG capsule Take 25 mg by mouth 3 (three) times daily.   Yes [provider]  QUEtiapine (SEROQUEL) 100 MG tablet Take 100 mg by mouth at bedtime.   Yes [provider]  senna (SENOKOT) 8.6 MG TABS tablet Take 2 tablets by mouth at bedtime.   Yes [provider]  sodium bicarbonate 650 MG tablet Take 650 mg by mouth 2 (two) times daily.   Yes [provider]  tamsulosin (FLOMAX) 0.4 MG CAPS capsule Take 2 capsules (0.8 mg total) by mouth daily. Patient taking differently: Take 0.4 mg by mouth daily. 02/09/23  Yes Peterson Ao, MD  tirzepatide [facility]) 2.5 MG/0.5ML Pen Inject 2.5 mg into the skin once a week. On Tuesdays   Yes [provider]  trolamine salicylate (ASPERCREME) 10 % cream Apply 1 Application topically in the morning, at noon, in the evening, and at bedtime. Right leg   Yes [provider]  UNABLE TO FIND Take 30 mLs by mouth in the morning and at bedtime. Med Name: liquid protein   Yes [provider]     Critical care time: 34 min

## 2023-07-06 NOTE — Progress Notes (Signed)
RT NOTE: patient was placed on CPAP/PSV of 5/5 at 0832.  Tolerating well at this time.  Will continue to monitor and wean as tolerated.

## 2023-07-06 NOTE — Procedures (Signed)
Extubation Procedure Note  Patient Details:   Name: Gregory [name] DOB: 22-May-1961 MRN: [id number]   Airway Documentation:    Vent end date: 07/06/23 Vent end time: 1005   Evaluation  O2 sats: stable throughout Complications: No apparent complications Patient did tolerate procedure well. Bilateral Breath Sounds: Diminished   Yes  Patient extubated to 6L [place] per MD order.  Positive cuff leak noted.  No evidence of stridor.  Patient able to speak post extubation.  Sats and vitals are currently within normal limits.  Will continue to monitor.   DONNIS SNOWDON 07/06/2023, 10:07 AM

## 2023-07-06 NOTE — Plan of Care (Signed)
  Problem: Safety: Goal: Non-violent Restraint(s) Outcome: Progressing   Problem: Education: Goal: Ability to describe self-care measures that may prevent or decrease complications (Diabetes Survival Skills Education) will improve Outcome: Progressing   Problem: Coping: Goal: Ability to adjust to condition or change in health will improve Outcome: Progressing   Problem: Fluid Volume: Goal: Ability to maintain a balanced intake and output will improve Outcome: Progressing   Problem: Health Behavior/Discharge Planning: Goal: Ability to identify and utilize available resources and services will improve Outcome: Progressing Goal: Ability to manage health-related needs will improve Outcome: Progressing   Problem: Metabolic: Goal: Ability to maintain appropriate glucose levels will improve Outcome: Progressing   Problem: Nutritional: Goal: Maintenance of adequate nutrition will improve Outcome: Progressing Goal: Progress toward achieving an optimal weight will improve Outcome: Progressing   Problem: Skin Integrity: Goal: Risk for impaired skin integrity will decrease Outcome: Progressing   Problem: Tissue Perfusion: Goal: Adequacy of tissue perfusion will improve Outcome: Progressing   Problem: Education: Goal: Knowledge of General Education information will improve Description: Including pain rating scale, medication(s)/side effects and non-pharmacologic comfort measures Outcome: Progressing   Problem: Health Behavior/Discharge Planning: Goal: Ability to manage health-related needs will improve Outcome: Progressing   Problem: Clinical Measurements: Goal: Ability to maintain clinical measurements within normal limits will improve Outcome: Progressing Goal: Will remain free from infection Outcome: Progressing Goal: Diagnostic test results will improve Outcome: Progressing Goal: Respiratory complications will improve Outcome: Progressing Goal: Cardiovascular  complication will be avoided Outcome: Progressing   Problem: Activity: Goal: Risk for activity intolerance will decrease Outcome: Progressing   Problem: Nutrition: Goal: Adequate nutrition will be maintained Outcome: Progressing   Problem: Coping: Goal: Level of anxiety will decrease Outcome: Progressing   Problem: Elimination: Goal: Will not experience complications related to bowel motility Outcome: Progressing Goal: Will not experience complications related to urinary retention Outcome: Progressing   Problem: Pain Management: Goal: General experience of comfort will improve Outcome: Progressing   Problem: Safety: Goal: Ability to remain free from injury will improve Outcome: Progressing   Problem: Education: Goal: Knowledge of disease or condition will improve Outcome: Progressing Goal: Knowledge of secondary prevention will improve (MUST DOCUMENT ALL) Outcome: Progressing Goal: Knowledge of patient specific risk factors will improve Loraine Leriche N/A or DELETE if not current risk factor) Outcome: Progressing   Problem: Skin Integrity: Goal: Risk for impaired skin integrity will decrease Outcome: Progressing   Problem: Ischemic Stroke/TIA Tissue Perfusion: Goal: Complications of ischemic stroke/TIA will be minimized Outcome: Progressing   Problem: Coping: Goal: Will verbalize positive feelings about self Outcome: Progressing Goal: Will identify appropriate support needs Outcome: Progressing

## 2023-07-06 NOTE — Progress Notes (Signed)
Palliative Medicine Progress Note   Patient Name: Gregory [name]       Date: 07/06/2023 DOB: April 08, 1961  Age: 63 y.o. MRN#: [id number] Attending Physician: Lynnell Catalan, MD Primary Care Physician: [name], Provider, MD Admit Date: 06/27/2023   HPI/Patient Profile: 63 y.o. 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  He was intubated in the ED for airway protection.  Initial workup revealed elevated creatinine and mild leukocytosis.  CT head was abnormal with concern for acute infarct.  He is admitted with acute encephalopathy, right sided pneumonia, and sepsis.   Palliative Medicine is consulted for goals of care.  Subjective: Chart reviewed. Discussed with Dr. Ned Card and RN. Patient tolerated SBT for 2 hours but became agitated towards the end.  He was awake and following commands off IV sedation.   I spoke with daughter/[name] by phone as a follow-up for palliative needs and emotional support.  Provided updates on patient's current status as per above.  Discussed that ICU team is hopeful patient can be successfully extubated soon.  [name] feels encouraged by patient's relative improvement.  She speaks again to the fact that he has recovered from serious illness on multiple occasions. We discussed that given his multiple co-morbidities, at some point he will no longer have the reserve to recover from a serious acute illness.   Discussed the importance of continued conversation with the medical team regarding overall plan of care and treatment options.    Objective:  Physical Exam Vitals reviewed.  Constitutional:       General: He is awake. He is not in acute distress.    Appearance: He is ill-appearing.  Pulmonary:     Comments: intubated Abdominal:     Comments: ileostomy  Musculoskeletal:     Right Lower Extremity: Right leg is amputated above knee.  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   Time: 35 minutes  Detailed review of medical records (labs, imaging, vital signs), medically appropriate exam, discussed with treatment team, counseling and education to family, & staff, documenting clinical information, coordination of care.   Merry Proud, NP   Please contact Palliative Medicine Team phone at [phone] for questions and concerns.  For individual providers, please see AMION.

## 2023-07-07 DIAGNOSIS — A419 Sepsis, unspecified organism: Secondary | ICD-10-CM | POA: Diagnosis not present

## 2023-07-07 DIAGNOSIS — N1832 Chronic kidney disease, stage 3b: Secondary | ICD-10-CM | POA: Diagnosis not present

## 2023-07-07 DIAGNOSIS — E119 Type 2 diabetes mellitus without complications: Secondary | ICD-10-CM | POA: Diagnosis not present

## 2023-07-07 DIAGNOSIS — J9601 Acute respiratory failure with hypoxia: Secondary | ICD-10-CM | POA: Diagnosis not present

## 2023-07-07 LAB — BASIC METABOLIC PANEL
Anion gap: 12 (ref 5–15)
BUN: 96 mg/dL — ABNORMAL HIGH (ref 8–23)
CO2: 19 mmol/L — ABNORMAL LOW (ref 22–32)
Calcium: 9.4 mg/dL (ref 8.9–10.3)
Chloride: 111 mmol/L (ref 98–111)
Creatinine, Ser: 1.65 mg/dL — ABNORMAL HIGH (ref 0.61–1.24)
GFR, Estimated: 46 mL/min — ABNORMAL LOW (ref >60)
Glucose, Bld: 218 mg/dL — ABNORMAL HIGH (ref 70–99)
Potassium: 5 mmol/L (ref 3.5–5.1)
Sodium: 142 mmol/L (ref 135–145)

## 2023-07-07 LAB — CBC
HCT: 34.6 % — ABNORMAL LOW (ref 39.0–52.0)
Hemoglobin: 10.9 g/dL — ABNORMAL LOW (ref 13.0–17.0)
MCH: 28.2 pg (ref 26.0–34.0)
MCHC: 31.5 g/dL (ref 30.0–36.0)
MCV: 89.4 fL (ref 80.0–100.0)
Platelets: 295 10*3/uL (ref 150–400)
RBC: 3.87 MIL/uL — ABNORMAL LOW (ref 4.22–5.81)
RDW: 15.9 % — ABNORMAL HIGH (ref 11.5–15.5)
WBC: 12.2 10*3/uL — ABNORMAL HIGH (ref 4.0–10.5)
nRBC: 0 % (ref 0.0–0.2)

## 2023-07-07 LAB — GLUCOSE, CAPILLARY
Glucose-Capillary: 181 mg/dL — ABNORMAL HIGH (ref 70–99)
Glucose-Capillary: 183 mg/dL — ABNORMAL HIGH (ref 70–99)
Glucose-Capillary: 207 mg/dL — ABNORMAL HIGH (ref 70–99)
Glucose-Capillary: 208 mg/dL — ABNORMAL HIGH (ref 70–99)
Glucose-Capillary: 232 mg/dL — ABNORMAL HIGH (ref 70–99)
Glucose-Capillary: 243 mg/dL — ABNORMAL HIGH (ref 70–99)

## 2023-07-07 MED ORDER — ALBUTEROL SULFATE (2.5 MG/3ML) 0.083% IN NEBU: 2.5000 mg | INHALATION_SOLUTION | Freq: Once | RESPIRATORY_TRACT | Status: DC

## 2023-07-07 MED ORDER — OXYMETAZOLINE HCL 0.05 % NA SOLN
1.0000 | Freq: Two times a day (BID) | NASAL | Status: AC | PRN
  Administered 2023-07-08: 1 via NASAL
  Filled 2023-07-07: qty 30

## 2023-07-07 MED ORDER — QUETIAPINE FUMARATE 50 MG PO TABS
50.0000 mg | ORAL_TABLET | Freq: Two times a day (BID) | ORAL | Status: DC
  Administered 2023-07-07 – 2023-07-08 (×3): 50 mg via ORAL
  Filled 2023-07-07 (×3): qty 1

## 2023-07-07 MED ORDER — INSULIN ASPART 100 UNIT/ML IJ SOLN
2.0000 [IU] | INTRAMUSCULAR | Status: DC
  Administered 2023-07-07 – 2023-07-08 (×6): 2 [IU] via SUBCUTANEOUS

## 2023-07-07 MED ORDER — MELATONIN 3 MG PO TABS
3.0000 mg | ORAL_TABLET | Freq: Once | ORAL | Status: AC
Start: 2023-07-08 — End: 2023-07-08
  Administered 2023-07-08: 3 mg via ORAL
  Filled 2023-07-07: qty 1

## 2023-07-07 NOTE — Progress Notes (Signed)
NAME:  Gregory [name], MRN:  [id number], DOB:  1960-12-25, LOS: 10 ADMISSION DATE:  06/27/2023, CONSULTATION DATE:  06/27/23 REFERRING MD:  EDP, CHIEF COMPLAINT:  AMS   History of Present Illness:  63 year old male with significant past medical history including diabetes, hypertension, hyperlipidemia, CKDIIIb, COPD, chronic osteomyelitis s/p R AKA, L midfoot amputation, atrial fibrillation, HFpEF, mesenteric ischemia s/p colostomy 12/2022 who presented to ED from SNF for altered mental status/obtunded. Reported increased liquid stool from ostomy. He was subsequently intubated in the ED. Labs in ED notable for K 5.8, Cr 2.6, WBC 13, trop 83>705, BNP 63, UA large leuk, wbc21-50 few bacteria, UDS negative. Admitted to PCCM.   Pertinent  Medical History  COPD, CKD3b, mesenteric ischemia s/p colostomy 12/2022, T2DM, HFpEF, HTN, HLD   Significant Hospital Events: Including procedures, antibiotic start and stop dates in addition to other pertinent events   1/9: admit from ED for AMS, intubated, CVC 1/10: MRI w/ 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 Stroke team/neuro consulted. Recommend stroke work up, d/c meningitis/encephalitis coverage, palliative consult.  1/10 unsuccessful attempts at lumbar puncture 1/12 significant ventilator dyssynchrony, given 1 dose of Nimbex, on Versed drip 1/13 agitated overnight despite fentanyl and versed pushes and Haldol 1/14 remains agitated with sedation wean  1/16 off versed gtt, responds to painful stimuli but not following commands 1/17 awakes more easily, moves all extremities and follows simple commands  1/18 extubated to [place]   Interim History / Subjective:  Remained agitated overnight, unable to get off precedex. This morning he is awake, oriented to self and place.   Objective   Blood pressure (!) 154/71, pulse 80, temperature 97.6 F (36.4 C), temperature source Oral, resp. rate (!) 33, height 6' 0.01" (1.829 m), weight 87.4 kg,  SpO2 91%.    Vent Mode: PSV;CPAP FiO2 (%):  [40 %] 40 % PEEP:  [5 cmH20] 5 cmH20 Pressure Support:  [5 cmH20] 5 cmH20   Intake/Output Summary (Last 24 hours) at 07/07/2023 1324 Last data filed at 07/07/2023 0500 Gross per 24 hour  Intake 1814.93 ml  Output 7050 ml  Net -5235.07 ml   Filed Weights   07/03/23 0411 07/04/23 0133 07/05/23 0430  Weight: 92.8 kg 85.7 kg 87.4 kg    Examination: General: chronically ill appearing male, appears comfortable Lungs: lungs are clear bilaterally Cardiovascular: regular rate, rhythm Abdomen: colostomy present w stool output, soft abdomen Extremities: R AKA, L midfoot amputation; left leg with edema and scabs Neuro: oriented to self, place, but not to time or situation GU: deferred  Na 142 K 5.0 Bicarb 19 Cr 1.65, GFR 46 CBGs 175-218 WBC 12.2 Hb 10.9  Plt 295  Resolved Hospital Problem list   Thrombocytopenia Hypernatremia  Assessment & Plan:   Septic shock 2/2 ESBL UTI and multifocal pneumonia Rhinovirus positive - off pressors - continue cefepime for total 7 day course, started 1/13 - completed course of meropenem for urine - trach aspirate growing klebsiella and serratia   Acute hypoxemic respiratory failure 2/2 multifocal pneumonia COPD - extubated 1/18 - on 4 L [place], wean supplemental O2 for spo2 goal 88-92% - cefepime as above - Brovana, Pulmicort, Yupelri  Encephalopathy/Agitation Chronic pain - wean precedex as able - Continue lyrica 125 mg TID - start seroquel 50 mg bid - oxycodone 10 mg q6h  AKI on CKD IIIb - Cr stable at 1.65 - 7.3 L UOP in the last 24 hrs - maintain renal perfusion - avoid nephrotoxic meds - replete  electrolytes prn  Normocytic anemia - Hb 10.9 (1u prbc on 1/13) - possibly hemoconcentration  - [place] Hb >7  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  - continue aspirin alone due to history of GI bleed   Diarrhea  - fiber supplementation   HFrEF - s/p IV lasix yesterday;  will hold diuresis today   HTN HLD - continue coreg 12.5 mg bid  - hydralazine 10 mg q8h - lipitor 80 mg daily  - IV hydralazine 10 mg q4h PRN for SBP>180 - IV labetalol 10 mg q2h PRN for SBP>180  T2DM - CBGs + SSI  Nutrition - cortrak/tube feeds  - SLP for swallow eval - free water 150 mL q2h     Best Practice (right click and "Reselect all SmartList Selections" daily)   Diet/type: tubefeeds DVT prophylaxis prophylactic heparin  Pressure ulcer(s): present on admission  GI prophylaxis: H2B Lines: N/A Foley:  N/A Code Status:  full code Last date of multidisciplinary goals of care discussion Chandra Batch, updated at bedside 1/18]  Labs   CBC: Recent Labs  Lab 07/02/23 0448 07/03/23 0405 07/04/23 0451 07/05/23 1043 07/07/23 0208  WBC 5.9 7.5 6.2 7.0 12.2*  NEUTROABS  --  3.7  --   --   --   HGB 8.6* 8.5* 8.3* 9.0* 10.9*  HCT 26.6* 27.3* 26.6* 29.2* 34.6*  MCV 89.0 91.3 93.0 92.1 89.4  PLT 135* 151 155 166 295    Basic Metabolic Panel: Recent Labs  Lab 07/01/23 0542 07/02/23 0448 07/03/23 0405 07/04/23 0451 07/05/23 1043 07/06/23 0251 07/07/23 0208  NA 145 146* 149* 147* 145 145 142  K 4.1 4.1 4.7 4.9 5.3* 5.4* 5.0  CL 117* 117* 117* 115* 118* 118* 111  CO2 22 20* 24 21* 21* 20* 19*  GLUCOSE 180* 183* 101* 205* 268* 236* 218*  BUN 47* 50* 69* 93* 91* 102* 96*  CREATININE 1.61* 1.37* 1.50* 1.69* 1.64* 1.72* 1.65*  CALCIUM 8.6* 9.1 9.1 9.0 9.1 9.2 9.4  MG 2.0 1.7 1.9  --   --  2.2  --   PHOS 2.9 2.4* 4.0  --   --  4.9*  --    GFR: Estimated Creatinine Clearance: 49.6 mL/min (A) (by C-G formula based on SCr of 1.65 mg/dL (H)). Recent Labs  Lab 07/03/23 0405 07/04/23 0451 07/05/23 1043 07/07/23 0208  WBC 7.5 6.2 7.0 12.2*    Liver Function Tests: No results for input(s): "AST", "ALT", "ALKPHOS", "BILITOT", "PROT", "ALBUMIN" in the last 168 hours.  No results for input(s): "LIPASE", "AMYLASE" in the last 168 hours. No results for  input(s): "AMMONIA" in the last 168 hours.   ABG    Component Value Date/Time   PHART 7.211 (L) 06/28/2023 0803   PCO2ART 51.2 (H) 06/28/2023 0803   PO2ART 99 06/28/2023 0803   HCO3 20.4 06/28/2023 0803   TCO2 22 06/28/2023 0803   ACIDBASEDEF 7.0 (H) 06/28/2023 0803   O2SAT 96 06/28/2023 0803     Coagulation Profile: No results for input(s): "INR", "PROTIME" in the last 168 hours.   Cardiac Enzymes: No results for input(s): "CKTOTAL", "CKMB", "CKMBINDEX", "TROPONINI" in the last 168 hours.  HbA1C: Hemoglobin A1C  Date/Time Value Ref Range Status  08/14/2016 12:00 AM 5.6  Final  01/18/2016 12:00 AM 5.5  Final   Hgb A1c MFr Bld  Date/Time Value Ref Range Status  06/27/2023 09:25 AM 6.4 (H) 4.8 - 5.6 % Final    Comment:    (NOTE) Pre diabetes:  5.7%-6.4%  Diabetes:              >6.4%  Glycemic control for   <7.0% adults with diabetes   12/03/2022 04:54 PM 6.9 (H) 4.8 - 5.6 % Final    Comment:    (NOTE) Pre diabetes:          5.7%-6.4%  Diabetes:              >6.4%  Glycemic control for   <7.0% adults with diabetes     CBG: Recent Labs  Lab 07/06/23 1148 07/06/23 1603 07/06/23 2013 07/07/23 0005 07/07/23 0422  GLUCAP 148* 175* 190* 207* 183*    Review of Systems:   Unable to obtain  Past Medical History:  He,  has a past medical history of Acquired absence of left great toe (HCC), Acquired absence of right leg below knee (HCC), Acute osteomyelitis of calcaneum, right (HCC) (10/02/2016), Acute respiratory failure (HCC), Amputation stump infection (HCC) (08/12/2018), Anemia, Basal cell carcinoma, eyelid, Cancer (HCC), Candidiasis (08/12/2018), CHF (congestive heart failure) (HCC), Chronic back pain, Chronic kidney disease, Chronic multifocal osteomyelitis (HCC), Chronic pain syndrome, COPD (chronic obstructive pulmonary disease) (HCC), DDD (degenerative disc disease), lumbar, Depression, Diabetes mellitus without complication (HCC), Diabetic ulcer  of toe of left foot (HCC) (10/02/2016), Difficult intubation, Difficulty in walking, not elsewhere classified, 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), Foot drop, bilateral, Foot drop, right (12/27/2015), GERD (gastroesophageal reflux disease), Herpesviral vesicular dermatitis, History of COVID-19 (03/15/2022), Hyperlipidemia, Hyperlipidemia, Hypertension, Insomnia, Malignant melanoma of other parts of face (HCC), Melanoma (HCC), MRSA bacteremia, Muscle weakness (generalized), Nasal congestion, Necrosis of toe (HCC) (07/08/2018), Neuromuscular dysfunction of bladder, Osteomyelitis (HCC), Osteomyelitis (HCC), Osteomyelitis (HCC), Other idiopathic peripheral autonomic neuropathy, Retention of urine, unspecified, Squamous cell carcinoma of skin, Stroke (HCC), Unsteadiness on feet, Urinary retention, Urinary retention, Urinary tract infection, Wears dentures, and Wears glasses.   Surgical History:   Past Surgical History:  Procedure Laterality Date   AMPUTATION Left 03/29/2017   Procedure: LEFT GREAT TOE AMPUTATION AT METATARSOPHALANGEAL JOINT;  Surgeon: Nadara Mustard, MD;  Location: [facility] OR;  Service: Orthopedics;  Laterality: Left;   AMPUTATION Right 03/29/2017   Procedure: RIGHT BELOW KNEE AMPUTATION;  Surgeon: Nadara Mustard, MD;  Location: [facility] OR;  Service: Orthopedics;  Laterality: Right;   AMPUTATION Left 06/06/2018   Procedure: LEFT 2ND TOE AMPUTATION;  Surgeon: Nadara Mustard, MD;  Location: [facility] OR;  Service: Orthopedics;  Laterality: Left;   AMPUTATION Right 11/19/2018   Procedure: AMPUTATION ABOVE KNEE;  Surgeon: Nadara Mustard, MD;  Location: [facility] OR;  Service: Orthopedics;  Laterality: Right;   ANTERIOR CERVICAL CORPECTOMY N/A 11/25/2015   Procedure: ANTERIOR CERVICAL FIVE CORPECTOMY Cervical four - six fusion;  Surgeon: Lisbeth Renshaw, MD;  Location: [facility] OR[facility];  Service: Neurosurgery;  Laterality: N/A;  ANTERIOR CERVICAL FIVE CORPECTOMY Cervical four - six fusion   APPLICATION OF WOUND VAC Right  10/22/2018   Procedure: Application Of Wound Vac;  Surgeon: Nadara Mustard, MD;  Location: [facility] OR;  Service: Orthopedics;  Laterality: Right;   CYSTOSCOPY WITH BIOPSY N/A 05/01/2022   Procedure: CYSTOSCOPY WITH URETHRAL BIOPSY;  Surgeon: Noel Christmas, MD;  Location: [facility] ORS;  Service: Urology;  Laterality: N/A;  1 HR   CYSTOSCOPY WITH RETROGRADE URETHROGRAM N/A 04/25/2018   Procedure: CYSTOSCOPY WITH RETROGRADE URETHROGRAM/ BALLOON DILATION;  Surgeon: Ihor Gully, MD;  Location: [facility] ORS;  Service: Urology;  Laterality: N/A;   CYSTOSCOPY WITH URETHRAL DILATATION N/A 05/01/2022   Procedure: BALLOON DILATION WITH  OPTILUME;  Surgeon: Noel Christmas, MD;  Location: [facility] ORS;  Service: Urology;  Laterality: N/A;   LAPAROTOMY N/A 12/03/2022   Procedure: EXPLORATORY LAPAROTOMY  total abdominal colectomy with end ileostomy;  Surgeon: Kinsinger, De Blanch, MD;  Location: [facility] OR;  Service: General;  Laterality: N/A;   MULTIPLE TOOTH EXTRACTIONS     POSTERIOR CERVICAL FUSION/FORAMINOTOMY N/A 11/29/2015   Procedure: Cervical Three-Cervical Seven Posterior Cervical Laminectomy with Fusion;  Surgeon: Lisbeth Renshaw, MD;  Location: [facility];  Service: Neurosurgery;  Laterality: N/A;  Cervical Three-Cervical Seven Posterior Cervical Laminectomy with Fusion   STUMP REVISION Right 10/22/2018   Procedure: REVISION RIGHT BELOW KNEE AMPUTATION;  Surgeon: Nadara Mustard, MD;  Location: [facility] OR;  Service: Orthopedics;  Laterality: Right;   STUMP REVISION Right 12/05/2018   Procedure: Revision Right Above Knee Amputation;  Surgeon: Nadara Mustard, MD;  Location: [facility] OR;  Service: Orthopedics;  Laterality: Right;   STUMP REVISION Right 05/29/2019   Procedure: REVISION RIGHT ABOVE KNEE AMPUTATION;  Surgeon: Nadara Mustard, MD;  Location: [facility] OR;  Service: Orthopedics;  Laterality: Right;   STUMP REVISION Right 06/24/2019   Procedure: STUMP REVISION;  Surgeon: Nadara Mustard, MD;  Location: [facility] OR;  Service: Orthopedics;   Laterality: Right;     Social History:   reports that he has been smoking cigarettes. He has never used smokeless tobacco. He reports that he does not drink alcohol and does not use drugs.   Family History:  His family history includes Bone cancer in his father; Cancer in his father, mother, paternal grandfather, and another family member; Diabetes in his mother and another family member; Heart disease in his mother; Prostate cancer in his father.   Allergies Allergies  Allergen Reactions   Latex Other (See Comments)    "ALLERGIC," per [facility]     Home Medications  Prior to Admission medications   Medication Sig Start Date End Date Taking? Authorizing Provider  acetaminophen (TYLENOL) 500 MG tablet Take 1,000 mg by mouth in the morning and at bedtime.   Yes [provider]  aspirin EC 81 MG tablet Take 81 mg by mouth daily. Swallow whole.   Yes [provider]  atorvastatin (LIPITOR) 80 MG tablet Take 1 tablet (80 mg total) by mouth daily. Patient taking differently: Take 80 mg by mouth at bedtime. 09/22/22 01/27/24 Yes Jerald Kief, MD  carvedilol (COREG) 12.5 MG tablet Take 1 tablet (12.5 mg total) by mouth 2 (two) times daily with a meal. 12/25/22  Yes Dorcas Carrow, MD  Cholecalciferol 25 MCG (1000 UT) capsule Take 2,000 Units by mouth daily.   Yes [provider]  Cranberry 450 MG CAPS Take 450 mg by mouth daily.   Yes [provider]  cyclobenzaprine (FLEXERIL) 5 MG tablet Take 5 mg by mouth every 8 (eight) hours as needed for muscle spasms.   Yes [provider]  Dextran 70-Hypromellose (NATURAL BALANCE TEARS) 0.1-0.3 % SOLN Place 1 drop into both eyes every 4 (four) hours as needed (dry eyes).   Yes [provider]  escitalopram (LEXAPRO) 20 MG tablet Take 20 mg by mouth daily.   Yes [provider]  ferrous sulfate 325 (65 FE) MG tablet Take 1 tablet (325 mg total) by mouth 3 (three) times daily with meals. Patient  taking differently: Take 325 mg by mouth daily. With meal. 01/23/23  Yes Cathren Laine, MD  fluticasone [facility]) 50 MCG/ACT nasal spray Place 1 spray into both nostrils  daily.   Yes [provider]  furosemide (LASIX) 20 MG tablet Take 20-40 mg by mouth See admin instructions. Take 40 mg by mouth in the morning and 20 mg by mouth in the afternoon   Yes [provider]  haloperidol lactate (HALDOL) 5 MG/ML injection Inject 1 mg into the muscle every 6 (six) hours as needed (agitation/psychosis).   Yes [provider]  hydrALAZINE (APRESOLINE) 10 MG tablet Take 1 tablet (10 mg total) by mouth every 8 (eight) hours. 12/25/22  Yes Dorcas Carrow, MD  hydrOXYzine (VISTARIL) 50 MG capsule Take 50 mg by mouth at bedtime.   Yes [provider]  insulin glargine (LANTUS SOLOSTAR) 100 UNIT/ML Solostar Pen Inject 15 Units into the skin daily. Patient taking differently: Inject 15 Units into the skin at bedtime. 09/07/22  Yes Rai, Ripudeep K, MD  insulin lispro (HUMALOG) 100 UNIT/ML KwikPen Inject 0-10 Units into the skin See admin instructions. Inject 0-10 units into the skin three times a day and at bedtime, PER SLIDING SCALE:  BGL 151-200 = 2 units 201-250 = 4 units 251-300 = 6 units 301-350 =  8 units 351-400 = 10 units Patient taking differently: Inject 0-10 Units into the skin See admin instructions. Inject 0-10 units into the skin before meals and at bedtime, PER SLIDING SCALE:  0-150 = 0 units 151-200 = 2 units 201-250 = 4 units 251-300 = 6 units 301-350 =  8 units 351-400 = 10 units 01/31/22  Yes Dahal, Melina Schools, MD  ipratropium-albuterol (DUONEB) 0.5-2.5 (3) MG/3ML SOLN Take 3 mLs by nebulization every 6 (six) hours as needed (for shortness of breath).   Yes [provider]  LACTOBACILLUS PO Take 1 capsule by mouth in the morning and at bedtime.   Yes [provider]  lidocaine (LIDODERM) 5 % Place 2 patches onto the skin daily. Remove & Discard  patch within 12 hours or as directed by MD Patient taking differently: Place 1 patch onto the skin in the morning and at bedtime. Take off after 12 hours 12/25/22  Yes Dorcas Carrow, MD  loperamide (IMODIUM) 2 MG capsule Take 1 capsule (2 mg total) by mouth 2 (two) times daily. 12/25/22  Yes Dorcas Carrow, MD  loratadine (CLARITIN) 10 MG tablet Take 10 mg by mouth in the morning.   Yes [provider]  magnesium oxide (MAG-OX) 400 (240 Mg) MG tablet Take 400 mg by mouth in the morning, at noon, and at bedtime.   Yes [provider]  Melatonin 10 MG TABS Take 10 mg by mouth at bedtime.   Yes [provider]  Meth-Hyo-M Bl-Na Phos-Ph Sal (URO-MP) 118 MG CAPS Take 118 mg by mouth 2 (two) times daily.   Yes [provider]  Multiple Vitamin (MULTIVITAMIN WITH MINERALS) TABS tablet Take 1 tablet by mouth daily. 12/25/22  Yes Dorcas Carrow, MD  naloxone [facility]) 4 MG/0.1ML LIQD nasal spray kit Place 0.4 mg into the nose as needed (opiate OD).   Yes [provider]  omeprazole (PRILOSEC) 20 MG capsule Take 20 mg by mouth at bedtime.   Yes [provider]  ondansetron (ZOFRAN) 4 MG tablet Take 4 mg by mouth every 8 (eight) hours as needed for nausea or vomiting.   Yes [provider]  oxybutynin (DITROPAN-XL) 5 MG 24 hr tablet Take 5 mg by mouth in the morning and at bedtime.   Yes [provider]  oxyCODONE (OXY IR/ROXICODONE) 5 MG immediate release tablet Take 5 mg  by mouth every 6 (six) hours as needed for moderate pain (pain score 4-6).   Yes [provider]  oxyCODONE (OXYCONTIN) 15 mg 12 hr tablet Take 15 mg by mouth in the morning, at noon, and at bedtime.   Yes [provider]  OXYGEN Inhale 2 L into the lungs as needed (to maintain sats above 90/SOB).   Yes [provider]  oxymetazoline (AFRIN) 0.05 % nasal spray Place 1 spray into both nostrils 2 (two) times daily as needed for congestion.   Yes  [provider]  pregabalin (LYRICA) 100 MG capsule Take 1 capsule (100 mg total) by mouth 2 (two) times daily. Patient taking differently: Take 100 mg by mouth 3 (three) times daily. 12/25/22 06/27/23 Yes Dorcas Carrow, MD  pregabalin (LYRICA) 25 MG capsule Take 25 mg by mouth 3 (three) times daily.   Yes [provider]  QUEtiapine (SEROQUEL) 100 MG tablet Take 100 mg by mouth at bedtime.   Yes [provider]  senna (SENOKOT) 8.6 MG TABS tablet Take 2 tablets by mouth at bedtime.   Yes [provider]  sodium bicarbonate 650 MG tablet Take 650 mg by mouth 2 (two) times daily.   Yes [provider]  tamsulosin (FLOMAX) 0.4 MG CAPS capsule Take 2 capsules (0.8 mg total) by mouth daily. Patient taking differently: Take 0.4 mg by mouth daily. 02/09/23  Yes Peterson Ao, MD  tirzepatide [facility]) 2.5 MG/0.5ML Pen Inject 2.5 mg into the skin once a week. On Tuesdays   Yes [provider]  trolamine salicylate (ASPERCREME) 10 % cream Apply 1 Application topically in the morning, at noon, in the evening, and at bedtime. Right leg   Yes [provider]  UNABLE TO FIND Take 30 mLs by mouth in the morning and at bedtime. Med Name: liquid protein   Yes [provider]     Critical care time: 30 min

## 2023-07-07 NOTE — Progress Notes (Signed)
   Palliative Medicine Inpatient Follow Up Note   The PMT remains peripherally involved in the care of Gregory [name] give goals for improvement.  Patient has been extubated (on 1/18) and is showing signs of improvement.   Has had a speech evaluation though remains NPO with a coretrack in place.   ICU is trying to wean off of Precedex at this time.  I have communicated with Dr. Ned Card and patients RN, Marcelle Smiling to please reach out to our team if any additional needs arise.  No Charge.  ______________________________________________________________________________________ Gregory [name] Palliative Medicine Team Team Cell Phone: [phone] Please utilize secure chat with additional questions, if there is no response within 30 minutes please call the above phone number  Palliative Medicine Team providers are available by phone from 7am to 7pm daily and can be reached through the team cell phone.  Should this patient require assistance outside of these hours, please call the patient's attending physician.

## 2023-07-07 NOTE — Progress Notes (Addendum)
Patient requesting water. Attempted bedside swallow, patient too drowsy at this time to perform. Will attempt later if appropriate.

## 2023-07-07 NOTE — Evaluation (Signed)
Clinical/Bedside Swallow Evaluation Patient Details  Name: Gregory [name] MRN: [id number] Date of Birth: [birth year]  Today's Date: 07/07/2023 Time: SLP Start Time (ACUTE ONLY): 1228 SLP Stop Time (ACUTE ONLY): 1238 SLP Time Calculation (min) (ACUTE ONLY): 10 min  Past Medical History:  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    Chronic pain syndrome    COPD (chronic obstructive pulmonary disease) (HCC)    DDD (degenerative disc disease), lumbar    Depression    major depressive disorder    Diabetes mellitus without complication (HCC)    type 2    Diabetic ulcer of toe of left foot (HCC) 10/02/2016   Difficult intubation    Difficulty in walking, not elsewhere classified    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   Foot drop, bilateral    Foot drop, right 12/27/2015   GERD (gastroesophageal reflux disease)    Herpesviral vesicular dermatitis    History of COVID-19 03/15/2022   Hyperlipidemia    Hyperlipidemia    Hypertension    Insomnia    Malignant melanoma of other parts of face (HCC)    Melanoma (HCC)    MRSA bacteremia    Muscle weakness (generalized)    Nasal congestion    Necrosis of toe (HCC) 07/08/2018   Neuromuscular dysfunction of bladder    Osteomyelitis (HCC)    Osteomyelitis (HCC)    Osteomyelitis (HCC)    right BKA   Other idiopathic peripheral autonomic neuropathy    Retention of urine, unspecified    Squamous cell carcinoma of skin    Stroke (HCC)    Unsteadiness on  feet    Urinary retention    Urinary retention    Urinary tract infection    Wears dentures    Wears glasses    Past Surgical History:  Past Surgical History:  Procedure Laterality Date   AMPUTATION Left 03/29/2017   Procedure: LEFT GREAT TOE AMPUTATION AT METATARSOPHALANGEAL JOINT;  Surgeon: Nadara Mustard, MD;  Location: [facility] OR;  Service: Orthopedics;  Laterality: Left;   AMPUTATION Right 03/29/2017   Procedure: RIGHT BELOW KNEE AMPUTATION;  Surgeon: Nadara Mustard, MD;  Location: [facility] OR;  Service: Orthopedics;  Laterality: Right;   AMPUTATION Left 06/06/2018   Procedure: LEFT 2ND TOE AMPUTATION;  Surgeon: Nadara Mustard, MD;  [facility];  Service: Orthopedics;  Laterality: Left;   AMPUTATION Right 11/19/2018   Procedure: AMPUTATION ABOVE KNEE;  Surgeon: Nadara Mustard, MD;  Location: [facility] OR;  Service: Orthopedics;  Laterality: Right;   ANTERIOR CERVICAL CORPECTOMY N/A 11/25/2015   Procedure: ANTERIOR CERVICAL FIVE CORPECTOMY Cervical four - six fusion;  Surgeon: Lisbeth Renshaw, MD;  Location: [facility];  Service: Neurosurgery;  Laterality: N/A;  ANTERIOR CERVICAL FIVE CORPECTOMY Cervical four - six fusion   APPLICATION OF WOUND VAC Right 10/22/2018   Procedure: Application Of Wound Vac;  Surgeon: Aldean Baker  V, MD;  Location: [facility] OR;  Service: Orthopedics;  Laterality: Right;   CYSTOSCOPY WITH BIOPSY N/A 05/01/2022   Procedure: CYSTOSCOPY WITH URETHRAL BIOPSY;  Surgeon: Noel Christmas, MD;  Location: [facility] ORS;  Service: Urology;  Laterality: N/A;  1 HR   CYSTOSCOPY WITH RETROGRADE URETHROGRAM N/A 04/25/2018   Procedure: CYSTOSCOPY WITH RETROGRADE URETHROGRAM/ BALLOON DILATION;  Surgeon: Ihor Gully, MD;  Location: [facility] ORS;  Service: Urology;  Laterality: N/A;   CYSTOSCOPY WITH URETHRAL DILATATION N/A 05/01/2022   Procedure: BALLOON DILATION WITH  OPTILUME;  Surgeon: Noel Christmas, MD;  Location: [facility] ORS;  Service: Urology;  Laterality: N/A;   LAPAROTOMY N/A 12/03/2022    Procedure: EXPLORATORY LAPAROTOMY  total abdominal colectomy with end ileostomy;  Surgeon: Kinsinger, De Blanch, MD;  Location: [facility] OR;  Service: General;  Laterality: N/A;   MULTIPLE TOOTH EXTRACTIONS     POSTERIOR CERVICAL FUSION/FORAMINOTOMY N/A 11/29/2015   Procedure: Cervical Three-Cervical Seven Posterior Cervical Laminectomy with Fusion;  Surgeon: Lisbeth Renshaw, MD;  Location: [facility];  Service: Neurosurgery;  Laterality: N/A;  Cervical Three-Cervical Seven Posterior Cervical Laminectomy with Fusion   STUMP REVISION Right 10/22/2018   Procedure: REVISION RIGHT BELOW KNEE AMPUTATION;  Surgeon: Nadara Mustard, MD;  Location: [facility] OR;  Service: Orthopedics;  Laterality: Right;   STUMP REVISION Right 12/05/2018   Procedure: Revision Right Above Knee Amputation;  Surgeon: Nadara Mustard, MD;  Location: [facility] OR;  Service: Orthopedics;  Laterality: Right;   STUMP REVISION Right 05/29/2019   Procedure: REVISION RIGHT ABOVE KNEE AMPUTATION;  Surgeon: Nadara Mustard, MD;  Location: [facility] OR;  Service: Orthopedics;  Laterality: Right;   STUMP REVISION Right 06/24/2019   Procedure: STUMP REVISION;  Surgeon: Nadara Mustard, MD;  Location: [facility] OR;  Service: Orthopedics;  Laterality: Right;   HPI:  Gregory [name] is a 63 year old male who presented to ED from SNF for altered mental status/obtunded.  He was subsequently intubated in the ED.  Required ETT 1/9-1/18. 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    Assessment / Plan / Recommendation  Clinical Impression  Gregory [name] prsents with clinical indicators of pharyngeal dysphagia following VDRF requiring 9 day intubation.  Gregory [name] able to attain phonation but vocal quality is rough.  Gregory [name] exhibited intermittent, slight wet vocal quality with trials of thin liquid and delayed cough x1.  Multiple  swallows noted.  Anterior spillage observed with cup sips.  Gregory [name] tolerated puree and soft solid texture without overt s/s of aspiration and appeared to acheive adequate oral clearance.  Gregory [name] was unable to bite through regular solid graham cracker.  Recommend instrumental assessment prior to initiation of PO diet.  Gregory [name] with agitation with cortrak and likely would not tolerate scope passage for FEES.  MBSS is preferred.  Gregory [name] may have small sips of water for comfort after good oral care, in moderation, when fully awake/alert, with upright positioning and 1:1 assistance.  Would recommend pinching and removing straw if giving liquid by straw sip as Gregory [name] demonstrates some impulsivity with liquids.    Recommend Gregory [name] remain NPO with alternate means of nutrition, hydration, and medication.   SLP Visit Diagnosis: Dysphagia, unspecified (R13.10)    Aspiration Risk  Moderate aspiration risk    Diet Recommendation NPO    Medication Administration: Via alternative means    Other  Recommendations Oral Care Recommendations: Oral care QID;Oral care prior  to ice chip/H20    Recommendations for follow up therapy are one component of a multi-disciplinary discharge planning process, led by the attending physician.  Recommendations may be updated based on patient status, additional functional criteria and insurance authorization.  Follow up Recommendations  (TBD)      Assistance Recommended at Discharge  TBD  Functional Status Assessment  (TBD)  Frequency and Duration  (TBD)          Prognosis Prognosis for improved oropharyngeal function:  (TBD)      Swallow Study   General Date of Onset: 06/27/23 HPI: Gregory [name] is a 63 year old male who presented to ED from SNF for altered mental status/obtunded.  He was subsequently intubated in the ED.  Required ETT 1/9-1/18. 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 Type of Study: Bedside Swallow Evaluation Previous Swallow Assessment: 07/02/21 clinical with recs for reg/thin, remote hx normal MBS 2013 Diet Prior to this Study: NPO;Cortrak/Small bore NG tube Temperature Spikes Noted: No History of Recent Intubation: Yes Total duration of intubation (days): 9 days Date extubated: 07/06/23 Behavior/Cognition: Alert;Requires cueing Oral Cavity Assessment: Dried secretions (mostly on lips) Oral Care Completed by SLP: Yes Oral Cavity - Dentition: Edentulous Self-Feeding Abilities: Needs assist Patient Positioning: Upright in bed Baseline Vocal Quality: Hoarse Volitional Cough: Weak    Oral/Motor/Sensory Function Overall Oral Motor/Sensory Function: Mild impairment Facial ROM: Reduced right Facial Symmetry: Abnormal symmetry right Lingual ROM: Reduced right;Reduced left Lingual Strength: Reduced Mandible: Within Functional Limits   Ice Chips Ice chips: Not tested   Thin Liquid Thin Liquid: Impaired Presentation: Cup;Spoon;Straw Pharyngeal  Phase Impairments: Wet Vocal Quality;Throat Clearing - Delayed;Multiple swallows    Nectar Thick Nectar Thick Liquid: Not tested   Honey Thick Honey Thick Liquid: Not tested   Puree Puree: Within functional limits Presentation: Spoon   Solid     Solid: Impaired Oral Phase Functional Implications: Impaired mastication      Kerrie Pleasure, MA, CCC-SLP Acute Rehabilitation Services Office: [phone] 07/07/2023,12:59 PM

## 2023-07-07 NOTE — Progress Notes (Signed)
eLink Physician-Brief Progress Note Patient Name: Gregory [name] DOB: [birth year] MRN: [id number]   Date of Service  07/07/2023  HPI/Events of Note  Patient is requesting Afrin nasal spray and melatonin.   eICU Interventions  Orders placed     Intervention Category Minor Interventions: Routine modifications to care plan (e.g. PRN medications for pain, fever)  Gregory [name] G Gregory [name] 07/07/2023, 11:54 PM

## 2023-07-08 ENCOUNTER — Inpatient Hospital Stay (HOSPITAL_COMMUNITY): Payer: Medicaid Other

## 2023-07-08 DIAGNOSIS — N1832 Chronic kidney disease, stage 3b: Secondary | ICD-10-CM | POA: Diagnosis not present

## 2023-07-08 DIAGNOSIS — J9601 Acute respiratory failure with hypoxia: Secondary | ICD-10-CM | POA: Diagnosis not present

## 2023-07-08 DIAGNOSIS — A419 Sepsis, unspecified organism: Secondary | ICD-10-CM | POA: Diagnosis not present

## 2023-07-08 DIAGNOSIS — E119 Type 2 diabetes mellitus without complications: Secondary | ICD-10-CM | POA: Diagnosis not present

## 2023-07-08 LAB — BASIC METABOLIC PANEL
Anion gap: 11 (ref 5–15)
BUN: 83 mg/dL — ABNORMAL HIGH (ref 8–23)
CO2: 21 mmol/L — ABNORMAL LOW (ref 22–32)
Calcium: 9.7 mg/dL (ref 8.9–10.3)
Chloride: 109 mmol/L (ref 98–111)
Creatinine, Ser: 1.68 mg/dL — ABNORMAL HIGH (ref 0.61–1.24)
GFR, Estimated: 45 mL/min — ABNORMAL LOW (ref >60)
Glucose, Bld: 223 mg/dL — ABNORMAL HIGH (ref 70–99)
Potassium: 5 mmol/L (ref 3.5–5.1)
Sodium: 141 mmol/L (ref 135–145)

## 2023-07-08 LAB — GLUCOSE, CAPILLARY
Glucose-Capillary: 158 mg/dL — ABNORMAL HIGH (ref 70–99)
Glucose-Capillary: 180 mg/dL — ABNORMAL HIGH (ref 70–99)
Glucose-Capillary: 197 mg/dL — ABNORMAL HIGH (ref 70–99)
Glucose-Capillary: 232 mg/dL — ABNORMAL HIGH (ref 70–99)
Glucose-Capillary: 239 mg/dL — ABNORMAL HIGH (ref 70–99)

## 2023-07-08 MED ORDER — QUETIAPINE FUMARATE 50 MG PO TABS: 50.0000 mg | ORAL_TABLET | Freq: Two times a day (BID) | ORAL | Status: DC

## 2023-07-08 MED ORDER — PREGABALIN 25 MG PO CAPS
125.0000 mg | ORAL_CAPSULE | Freq: Three times a day (TID) | ORAL | Status: DC
  Administered 2023-07-08 – 2023-07-11 (×9): 125 mg via ORAL
  Filled 2023-07-08 (×10): qty 1

## 2023-07-08 MED ORDER — QUETIAPINE FUMARATE 25 MG PO TABS
50.0000 mg | ORAL_TABLET | Freq: Two times a day (BID) | ORAL | Status: DC
  Administered 2023-07-08: 50 mg via ORAL
  Filled 2023-07-08 (×2): qty 2

## 2023-07-08 MED ORDER — DOCUSATE SODIUM 50 MG/5ML PO LIQD: 100.0000 mg | Freq: Two times a day (BID) | ORAL | Status: DC | PRN

## 2023-07-08 MED ORDER — HYDRALAZINE HCL 10 MG PO TABS
10.0000 mg | ORAL_TABLET | Freq: Three times a day (TID) | ORAL | Status: DC
  Administered 2023-07-08 – 2023-07-11 (×8): 10 mg via ORAL
  Filled 2023-07-08 (×11): qty 1

## 2023-07-08 MED ORDER — JUVEN PO PACK
1.0000 | PACK | Freq: Two times a day (BID) | ORAL | Status: DC
  Administered 2023-07-09 – 2023-07-10 (×3): 1 via ORAL
  Filled 2023-07-08 (×3): qty 1

## 2023-07-08 MED ORDER — OXYCODONE HCL 5 MG PO TABS
15.0000 mg | ORAL_TABLET | Freq: Four times a day (QID) | ORAL | Status: DC
  Administered 2023-07-08 – 2023-07-09 (×3): 15 mg via ORAL
  Filled 2023-07-08 (×4): qty 3

## 2023-07-08 MED ORDER — ACETAMINOPHEN 325 MG PO TABS
650.0000 mg | ORAL_TABLET | Freq: Four times a day (QID) | ORAL | Status: DC | PRN
  Administered 2023-07-09 – 2023-07-11 (×3): 650 mg via ORAL
  Filled 2023-07-08 (×3): qty 2

## 2023-07-08 MED ORDER — CARMEX CLASSIC LIP BALM EX OINT
TOPICAL_OINTMENT | CUTANEOUS | Status: DC | PRN
  Filled 2023-07-08: qty 10

## 2023-07-08 MED ORDER — INSULIN ASPART 100 UNIT/ML IJ SOLN
4.0000 [IU] | INTRAMUSCULAR | Status: DC
  Administered 2023-07-08: 4 [IU] via SUBCUTANEOUS

## 2023-07-08 MED ORDER — FUROSEMIDE 40 MG PO TABS
40.0000 mg | ORAL_TABLET | Freq: Every day | ORAL | Status: DC
  Administered 2023-07-08 – 2023-07-11 (×4): 40 mg
  Filled 2023-07-08 (×4): qty 1

## 2023-07-08 MED ORDER — ATORVASTATIN CALCIUM 80 MG PO TABS
80.0000 mg | ORAL_TABLET | Freq: Every day | ORAL | Status: DC
  Administered 2023-07-09 – 2023-07-11 (×3): 80 mg via ORAL
  Filled 2023-07-08: qty 2
  Filled 2023-07-08 (×2): qty 1

## 2023-07-08 MED ORDER — CARVEDILOL 12.5 MG PO TABS
12.5000 mg | ORAL_TABLET | Freq: Two times a day (BID) | ORAL | Status: DC
  Administered 2023-07-08 – 2023-07-11 (×7): 12.5 mg via ORAL
  Filled 2023-07-08 (×7): qty 1

## 2023-07-08 MED ORDER — INSULIN ASPART 100 UNIT/ML IJ SOLN
0.0000 [IU] | Freq: Three times a day (TID) | INTRAMUSCULAR | Status: DC
Start: 2023-07-08 — End: 2023-07-11
  Administered 2023-07-08: 2 [IU] via SUBCUTANEOUS
  Administered 2023-07-09 (×2): 3 [IU] via SUBCUTANEOUS
  Administered 2023-07-09 – 2023-07-10 (×2): 5 [IU] via SUBCUTANEOUS
  Administered 2023-07-10 – 2023-07-11 (×3): 2 [IU] via SUBCUTANEOUS

## 2023-07-08 MED ORDER — METHYLENE BLUE (ANTIDOTE) 1 % IV SOLN: 2.0000 mg/kg/h | INTRAVENOUS | Status: DC

## 2023-07-08 MED ORDER — OXYCODONE HCL 5 MG PO TABS: 15.0000 mg | ORAL_TABLET | Freq: Four times a day (QID) | ORAL | Status: DC

## 2023-07-08 MED ORDER — ASPIRIN 81 MG PO CHEW
81.0000 mg | CHEWABLE_TABLET | Freq: Every day | ORAL | Status: DC
  Administered 2023-07-09 – 2023-07-11 (×3): 81 mg via ORAL
  Filled 2023-07-08 (×3): qty 1

## 2023-07-08 MED ORDER — FAMOTIDINE 20 MG PO TABS
20.0000 mg | ORAL_TABLET | Freq: Every day | ORAL | Status: DC
  Administered 2023-07-09 – 2023-07-11 (×3): 20 mg via ORAL
  Filled 2023-07-08 (×3): qty 1

## 2023-07-08 NOTE — Inpatient Diabetes Management (Signed)
Inpatient Diabetes Program Recommendations  AACE/ADA: New Consensus Statement on Inpatient Glycemic Control   Target Ranges:  Prepandial:   less than 140 mg/dL      Peak postprandial:   less than 180 mg/dL (1-2 hours)      Critically ill patients:  140 - 180 mg/dL    Latest Reference Range & Units 07/07/23 07:35 07/07/23 11:19 07/07/23 15:18 07/07/23 20:00 07/08/23 00:08 07/08/23 03:59 07/08/23 07:29  Glucose-Capillary 70 - 99 mg/dL 098 (H) 119 (H) 147 (H) 243 (H) 239 (H) 180 (H) 197 (H)   Review of Glycemic Control  Diabetes history: DM2 Outpatient Diabetes medications: Lantus 15 units at bedtime, Humalog 0-10 units TID with meals, Mounjaro 2.5 mg Qweek Current orders for Inpatient glycemic control: Novolog 0-15 units Q4H, Novolog 2 units Q4H; Vital @ 60 ml/hr  Inpatient Diabetes Program Recommendations:    Insulin: Please consider increasing tube feeding coverage to Novolog 5 units Q4H.  Thanks, Orlando Penner, RN, MSN, CDCES Diabetes Coordinator Inpatient Diabetes Program [phone] (Team Pager from 8am to 5pm)

## 2023-07-08 NOTE — TOC Progression Note (Signed)
Transition of Care [facility]) - Progression Note    Patient Details  Name: Gregory [name] MRN: [id number] Date of Birth: 05-04-1961  Transition of Care [facility]) CM/SW Contact  Marliss Coots, LCSW Phone Number: 07/08/2023, 8:27 AM  Clinical Narrative:     8:27 AM Per chart review, patient has been extubated but remains on a cortrak and continues to wean off drip. Patient is not fully oriented at this time. Patient is able to return to [facility] once medically ready. TOC will continue to follow.  Expected Discharge Plan: Skilled Nursing Facility Barriers to Discharge: Continued Medical Work up  Expected Discharge Plan and Services     Post Acute Care Choice: Skilled Nursing Facility Living arrangements for the past 2 months: Skilled Nursing Facility                                       Social Determinants of Health (SDOH) Interventions SDOH Screenings   Food Insecurity: No Food Insecurity (06/30/2023)  Housing: Low Risk  (06/30/2023)  Transportation Needs: No Transportation Needs (06/30/2023)  Utilities: Not At Risk (06/30/2023)  Depression (PHQ2-9): Low Risk  (03/08/2021)  Tobacco Use: High Risk (06/30/2023)    Readmission Risk Interventions    09/06/2022    1:48 PM 08/27/2022   12:46 PM 05/03/2022   11:53 AM  Readmission Risk Prevention Plan  Transportation Screening Complete Complete Complete  Medication Review Oceanographer) Complete Complete Complete  PCP or Specialist appointment within 3-5 days of discharge Complete Complete Complete  HRI or Home Care Consult Complete Complete Complete  SW Recovery Care/Counseling Consult Complete Complete Complete  Palliative Care Screening Not Applicable Not Applicable Not Applicable  Skilled Nursing Facility Complete Complete Complete

## 2023-07-08 NOTE — Progress Notes (Signed)
NAME:  HILLMAN RUDISILL, MRN:  [id number], DOB:  [birth year], LOS: 11 ADMISSION DATE:  06/27/2023, CONSULTATION DATE:  06/27/23 REFERRING MD:  EDP, CHIEF COMPLAINT:  AMS   History of Present Illness:  63 year old male with significant past medical history including diabetes, hypertension, hyperlipidemia, CKDIIIb, COPD, chronic osteomyelitis s/p R AKA, L midfoot amputation, atrial fibrillation, HFpEF, mesenteric ischemia s/p colostomy 12/2022 who presented to ED from SNF for altered mental status/obtunded. Reported increased liquid stool from ostomy. He was subsequently intubated in the ED. Labs in ED notable for K 5.8, Cr 2.6, WBC 13, trop 83>705, BNP 63, UA large leuk, wbc21-50 few bacteria, UDS negative. Admitted to PCCM.   Pertinent  Medical History  COPD, CKD3b, mesenteric ischemia s/p colostomy 12/2022, T2DM, HFpEF, HTN, HLD   Significant Hospital Events: Including procedures, antibiotic start and stop dates in addition to other pertinent events   1/9: admit from ED for AMS, intubated, CVC 1/10: MRI w/ 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 Stroke team/neuro consulted. Recommend stroke work up, d/c meningitis/encephalitis coverage, palliative consult.  1/10 unsuccessful attempts at lumbar puncture 1/12 significant ventilator dyssynchrony, given 1 dose of Nimbex, on Versed drip 1/13 agitated overnight despite fentanyl and versed pushes and Haldol 1/14 remains agitated with sedation wean  1/16 off versed gtt, responds to painful stimuli but not following commands 1/17 awakes more easily, moves all extremities and follows simple commands  1/18 extubated to [facility]  1/19 remained off precedex   Interim History / Subjective:  No overnight events. Awake, oriented. Wants some ice chips.   Objective   Blood pressure (!) 151/64, pulse 93, temperature 98 F (36.7 C), temperature source Axillary, resp. rate 17, height 6' 0.01" (1.829 m), weight 90.4 kg, SpO2 93%.         Intake/Output Summary (Last 24 hours) at 07/08/2023 8295 Last data filed at 07/08/2023 0400 Gross per 24 hour  Intake 1664.38 ml  Output 3250 ml  Net -1585.62 ml   Filed Weights   07/04/23 0133 07/05/23 0430 07/07/23 0500  Weight: 85.7 kg 87.4 kg 90.4 kg    Examination: General: chronically ill appearing male, appears comfortable Lungs: lungs are clear  Cardiovascular: regular rate, rhythm Abdomen: colostomy present w some stool output, soft abdomen Extremities: R AKA, L midfoot amputation; left leg with edema and scabs Neuro: oriented to self, place, but not to time or situation GU: deferred  Na 141 K 5.0 Bicarb 21 Cr 1.68, GFR 45 CBGs 180-243   Resolved Hospital Problem list   Thrombocytopenia Hypernatremia  Assessment & Plan:   Septic shock 2/2 ESBL UTI and multifocal pneumonia Rhinovirus positive - off pressors - continue cefepime for total 7 day course, started 1/13 - completed course of meropenem for urine - trach aspirate growing klebsiella and serratia   Acute hypoxemic respiratory failure 2/2 multifocal pneumonia COPD - extubated 1/18 - on 2-5 L [place], wean supplemental O2 for spo2 goal 88-92% - cefepime as above - Brovana, Pulmicort, Yupelri  Encephalopathy/Agitation Chronic pain - off precedex - continue lyrica 125 mg TID - continue seroquel 50 mg bid - increase oxycodone to 15 mg q6h  AKI on CKD IIIb - Cr stable at 1.68 - 2.5 L UOP in the last 24 hrs - maintain renal perfusion - avoid nephrotoxic meds - replete electrolytes prn  Normocytic anemia - stable - maintain Hb >7  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    Best Practice (right click and "Reselect all SmartList Selections" daily)   Diet/type: tubefeeds DVT prophylaxis prophylactic heparin  Pressure ulcer(s): present on admission  GI prophylaxis: H2B Lines: N/A Foley:  N/A Code Status:  full code Last date of multidisciplinary goals of care discussion Chandra Batch, updated at bedside 1/18, unable to reach by phone 1/20]  Labs   CBC: Recent Labs  Lab 07/02/23 0448 07/03/23 0405 07/04/23 0451 07/05/23 1043 07/07/23 0208  WBC 5.9 7.5 6.2 7.0 12.2*  NEUTROABS  --  3.7  --   --   --   HGB 8.6* 8.5* 8.3* 9.0* 10.9*  HCT 26.6* 27.3* 26.6* 29.2* 34.6*  MCV 89.0 91.3 93.0 92.1 89.4  PLT 135* 151 155 166 295    Basic Metabolic Panel: Recent Labs  Lab 07/02/23 0448 07/03/23 0405 07/04/23 0451 07/05/23 1043 07/06/23 0251 07/07/23 0208 07/08/23 0212  NA 146* 149* 147* 145 145 142 141  K 4.1 4.7 4.9 5.3* 5.4* 5.0 5.0  CL 117* 117* 115* 118* 118* 111 109  CO2 20* 24 21* 21* 20* 19* 21*  GLUCOSE 183* 101* 205* 268* 236* 218* 223*  BUN 50* 69* 93* 91* 102* 96* 83*  CREATININE 1.37* 1.50* 1.69* 1.64* 1.72* 1.65* 1.68*  CALCIUM 9.1 9.1 9.0 9.1 9.2 9.4 9.7  MG 1.7 1.9  --   --  2.2  --   --   PHOS 2.4* 4.0  --   --  4.9*  --   --    GFR: Estimated Creatinine Clearance: 48.8 mL/min (A) (by C-G formula based on SCr of 1.68 mg/dL (H)). Recent Labs  Lab 07/03/23 0405 07/04/23 0451 07/05/23 1043 07/07/23 0208  WBC 7.5 6.2 7.0 12.2*    Liver Function Tests: No results for input(s): "AST", "ALT", "ALKPHOS", "BILITOT", "PROT", "ALBUMIN" in the last 168 hours.  No results for input(s): "LIPASE", "AMYLASE" in the last 168 hours. No results for input(s): "AMMONIA" in the last 168 hours.   ABG    Component Value Date/Time   PHART 7.211 (L) 06/28/2023 0803   PCO2ART 51.2 (H) 06/28/2023 0803   PO2ART 99  06/28/2023 0803   HCO3 20.4 06/28/2023 0803   TCO2 22 06/28/2023 0803   ACIDBASEDEF 7.0 (H) 06/28/2023 0803   O2SAT 96 06/28/2023 0803     Coagulation Profile: No results for input(s): "INR", "PROTIME" in the last 168 hours.   Cardiac Enzymes: No results for input(s): "CKTOTAL", "CKMB", "CKMBINDEX", "TROPONINI" in the last 168 hours.  HbA1C: Hemoglobin A1C  Date/Time Value Ref Range Status  08/14/2016 12:00 AM 5.6  Final  01/18/2016 12:00 AM 5.5  Final   Hgb A1c MFr Bld  Date/Time Value Ref Range Status  06/27/2023 09:25 AM 6.4 (H) 4.8 - 5.6 % Final    Comment:    (NOTE) Pre diabetes:          5.7%-6.4%  Diabetes:              >6.4%  Glycemic control for   <7.0% adults with  diabetes   12/03/2022 04:54 PM 6.9 (H) 4.8 - 5.6 % Final    Comment:    (NOTE) Pre diabetes:          5.7%-6.4%  Diabetes:              >6.4%  Glycemic control for   <7.0% adults with diabetes     CBG: Recent Labs  Lab 07/07/23 1119 07/07/23 1518 07/07/23 2000 07/08/23 0008 07/08/23 0359  GLUCAP 232* 181* 243* 239* 180*    Review of Systems:   Unable to obtain  Past Medical History:  He,  has a past medical history of Acquired absence of left great toe (HCC), Acquired absence of right leg below knee (HCC), Acute osteomyelitis of calcaneum, right (HCC) (10/02/2016), Acute respiratory failure (HCC), Amputation stump infection (HCC) (08/12/2018), Anemia, Basal cell carcinoma, eyelid, Cancer (HCC), Candidiasis (08/12/2018), CHF (congestive heart failure) (HCC), Chronic back pain, Chronic kidney disease, Chronic multifocal osteomyelitis (HCC), Chronic pain syndrome, COPD (chronic obstructive pulmonary disease) (HCC), DDD (degenerative disc disease), lumbar, Depression, Diabetes mellitus without complication (HCC), Diabetic ulcer of toe of left foot (HCC) (10/02/2016), Difficult intubation, Difficulty in walking, not elsewhere classified, 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), Foot drop, bilateral,  Foot drop, right (12/27/2015), GERD (gastroesophageal reflux disease), Herpesviral vesicular dermatitis, History of COVID-19 (03/15/2022), Hyperlipidemia, Hyperlipidemia, Hypertension, Insomnia, Malignant melanoma of other parts of face (HCC), Melanoma (HCC), MRSA bacteremia, Muscle weakness (generalized), Nasal congestion, Necrosis of toe (HCC) (07/08/2018), Neuromuscular dysfunction of bladder, Osteomyelitis (HCC), Osteomyelitis (HCC), Osteomyelitis (HCC), Other idiopathic peripheral autonomic neuropathy, Retention of urine, unspecified, Squamous cell carcinoma of skin, Stroke (HCC), Unsteadiness on feet, Urinary retention, Urinary retention, Urinary tract infection, Wears dentures, and Wears glasses.   Surgical History:   Past Surgical History:  Procedure Laterality Date   AMPUTATION Left 03/29/2017   Procedure: LEFT GREAT TOE AMPUTATION AT METATARSOPHALANGEAL JOINT;  Surgeon: Nadara Mustard, MD;  Location: [facility] OR;  Service: Orthopedics;  Laterality: Left;   AMPUTATION Right 03/29/2017   Procedure: RIGHT BELOW KNEE AMPUTATION;  Surgeon: Nadara Mustard, MD;  Location: [facility] OR;  Service: Orthopedics;  Laterality: Right;   AMPUTATION Left 06/06/2018   Procedure: LEFT 2ND TOE AMPUTATION;  Surgeon: Nadara Mustard, MD;  Location: [facility] OR;  Service: Orthopedics;  Laterality: Left;   AMPUTATION Right 11/19/2018   Procedure: AMPUTATION ABOVE KNEE;  Surgeon: Nadara Mustard, MD;  Location: [facility] OR;  Service: Orthopedics;  Laterality: Right;   ANTERIOR CERVICAL CORPECTOMY N/A 11/25/2015   Procedure: ANTERIOR CERVICAL FIVE CORPECTOMY Cervical four - six fusion;  Surgeon: Lisbeth Renshaw, MD;  Location: [facility];  Service: Neurosurgery;  Laterality: N/A;  ANTERIOR CERVICAL FIVE CORPECTOMY Cervical four - six fusion   APPLICATION OF WOUND VAC Right 10/22/2018   Procedure: Application Of Wound Vac;  Surgeon: Nadara Mustard, MD;  Location[facility];  Service: Orthopedics;  Laterality: Right;   CYSTOSCOPY WITH BIOPSY N/A  05/01/2022   Procedure: CYSTOSCOPY WITH URETHRAL BIOPSY;  Surgeon: Noel Christmas, MD;  Location: [facility] ORS;  Service: Urology;  Laterality: N/A;  1 HR   CYSTOSCOPY WITH RETROGRADE URETHROGRAM N/A 04/25/2018   Procedure: CYSTOSCOPY WITH RETROGRADE URETHROGRAM/ BALLOON DILATION;  Surgeon: Ihor Gully, MD;  Location: [facility] ORS;  Service: Urology;  Laterality: N/A;   CYSTOSCOPY WITH URETHRAL DILATATION N/A 05/01/2022   Procedure: BALLOON DILATION WITH  OPTILUME;  Surgeon: Noel Christmas, MD;  Location: [facility] ORS;  Service: Urology;  Laterality: N/A;   LAPAROTOMY N/A 12/03/2022   Procedure: EXPLORATORY  LAPAROTOMY  total abdominal colectomy with end ileostomy;  Surgeon: Kinsinger, De Blanch, MD;  Location: [facility] OR;  Service: General;  Laterality: N/A;   MULTIPLE TOOTH EXTRACTIONS     POSTERIOR CERVICAL FUSION/FORAMINOTOMY N/A 11/29/2015   Procedure: Cervical Three-Cervical Seven Posterior Cervical Laminectomy with Fusion;  Surgeon: Lisbeth Renshaw, MD;  Location: [facility];  Service: Neurosurgery;  Laterality: N/A;  Cervical Three-Cervical Seven Posterior Cervical Laminectomy with Fusion   STUMP REVISION Right 10/22/2018   Procedure: REVISION RIGHT BELOW KNEE AMPUTATION;  Surgeon: Nadara Mustard, MD;  Location: [facility] OR;  Service: Orthopedics;  Laterality: Right;   STUMP REVISION Right 12/05/2018   Procedure: Revision Right Above Knee Amputation;  Surgeon: Nadara Mustard, MD;  Location: [facility] OR;  Service: Orthopedics;  Laterality: Right;   STUMP REVISION Right 05/29/2019   Procedure: REVISION RIGHT ABOVE KNEE AMPUTATION;  Surgeon: Nadara Mustard, MD;  Location: [facility] OR;  Service: Orthopedics;  Laterality: Right;   STUMP REVISION Right 06/24/2019   Procedure: STUMP REVISION;  Surgeon: Nadara Mustard, MD;  Location: [facility] OR;  Service: Orthopedics;  Laterality: Right;     Social History:   reports that he has been smoking cigarettes. He has never used smokeless tobacco. He reports that he does not drink alcohol and  does not use drugs.   Family History:  His family history includes Bone cancer in his father; Cancer in his father, mother, paternal grandfather, and another family member; Diabetes in his mother and another family member; Heart disease in his mother; Prostate cancer in his father.   Allergies Allergies  Allergen Reactions   Latex Other (See Comments)    "ALLERGIC," per [facility]     Home Medications  Prior to Admission medications   Medication Sig Start Date End Date Taking? Authorizing Provider  acetaminophen (TYLENOL) 500 MG tablet Take 1,000 mg by mouth in the morning and at bedtime.   Yes [provider]  aspirin EC 81 MG tablet Take 81 mg by mouth daily. Swallow whole.   Yes [provider]  atorvastatin (LIPITOR) 80 MG tablet Take 1 tablet (80 mg total) by mouth daily. Patient taking differently: Take 80 mg by mouth at bedtime. 09/22/22 01/27/24 Yes Jerald Kief, MD  carvedilol (COREG) 12.5 MG tablet Take 1 tablet (12.5 mg total) by mouth 2 (two) times daily with a meal. 12/25/22  Yes Dorcas Carrow, MD  Cholecalciferol 25 MCG (1000 UT) capsule Take 2,000 Units by mouth daily.   Yes [provider]  Cranberry 450 MG CAPS Take 450 mg by mouth daily.   Yes [provider]  cyclobenzaprine (FLEXERIL) 5 MG tablet Take 5 mg by mouth every 8 (eight) hours as needed for muscle spasms.   Yes [provider]  Dextran 70-Hypromellose (NATURAL BALANCE TEARS) 0.1-0.3 % SOLN Place 1 drop into both eyes every 4 (four) hours as needed (dry eyes).   Yes [provider]  escitalopram (LEXAPRO) 20 MG tablet Take 20 mg by mouth daily.   Yes [provider]  ferrous sulfate 325 (65 FE) MG tablet Take 1 tablet (325 mg total) by mouth 3 (three) times daily with meals. Patient taking differently: Take 325 mg by mouth daily. With meal. 01/23/23  Yes Cathren Laine, MD  fluticasone [facility]) 50 MCG/ACT nasal spray Place 1 spray into both nostrils daily.    Yes [provider]  furosemide (LASIX) 20 MG tablet Take 20-40 mg by mouth See admin instructions. Take 40 mg by mouth  in the morning and 20 mg by mouth in the afternoon   Yes [provider]  haloperidol lactate (HALDOL) 5 MG/ML injection Inject 1 mg into the muscle every 6 (six) hours as needed (agitation/psychosis).   Yes [provider]  hydrALAZINE (APRESOLINE) 10 MG tablet Take 1 tablet (10 mg total) by mouth every 8 (eight) hours. 12/25/22  Yes Dorcas Carrow, MD  hydrOXYzine (VISTARIL) 50 MG capsule Take 50 mg by mouth at bedtime.   Yes [provider]  insulin glargine (LANTUS SOLOSTAR) 100 UNIT/ML Solostar Pen Inject 15 Units into the skin daily. Patient taking differently: Inject 15 Units into the skin at bedtime. 09/07/22  Yes Rai, Ripudeep K, MD  insulin lispro (HUMALOG) 100 UNIT/ML KwikPen Inject 0-10 Units into the skin See admin instructions. Inject 0-10 units into the skin three times a day and at bedtime, PER SLIDING SCALE:  BGL 151-200 = 2 units 201-250 = 4 units 251-300 = 6 units 301-350 =  8 units 351-400 = 10 units Patient taking differently: Inject 0-10 Units into the skin See admin instructions. Inject 0-10 units into the skin before meals and at bedtime, PER SLIDING SCALE:  0-150 = 0 units 151-200 = 2 units 201-250 = 4 units 251-300 = 6 units 301-350 =  8 units 351-400 = 10 units 01/31/22  Yes Dahal, Melina Schools, MD  ipratropium-albuterol (DUONEB) 0.5-2.5 (3) MG/3ML SOLN Take 3 mLs by nebulization every 6 (six) hours as needed (for shortness of breath).   Yes [provider]  LACTOBACILLUS PO Take 1 capsule by mouth in the morning and at bedtime.   Yes [provider]  lidocaine (LIDODERM) 5 % Place 2 patches onto the skin daily. Remove & Discard patch within 12 hours or as directed by MD Patient taking differently: Place 1 patch onto the skin in the morning and at bedtime. Take off after 12 hours 12/25/22  Yes Dorcas Carrow, MD  loperamide (IMODIUM) 2 MG capsule Take 1 capsule (2 mg total) by mouth 2 (two) times daily. 12/25/22  Yes Dorcas Carrow, MD  loratadine (CLARITIN) 10 MG tablet Take 10 mg by mouth in the morning.   Yes [provider]  magnesium oxide (MAG-OX) 400 (240 Mg) MG tablet Take 400 mg by mouth in the morning, at noon, and at bedtime.   Yes [provider]  Melatonin 10 MG TABS Take 10 mg by mouth at bedtime.   Yes [provider]  Meth-Hyo-M Bl-Na Phos-Ph Sal (URO-MP) 118 MG CAPS Take 118 mg by mouth 2 (two) times daily.   Yes [provider]  Multiple Vitamin (MULTIVITAMIN WITH MINERALS) TABS tablet Take 1 tablet by mouth daily. 12/25/22  Yes Dorcas Carrow, MD  naloxone [facility]) 4 MG/0.1ML LIQD nasal spray kit Place 0.4 mg into the nose as needed (opiate OD).   Yes [provider]  omeprazole (PRILOSEC) 20 MG capsule Take 20 mg by mouth at bedtime.   Yes [provider]  ondansetron (ZOFRAN) 4 MG tablet Take 4 mg by mouth every 8 (eight) hours as needed for nausea or vomiting.   Yes [provider]  oxybutynin (DITROPAN-XL) 5 MG 24 hr tablet Take 5 mg by mouth in the morning and at bedtime.   Yes [provider]  oxyCODONE (OXY IR/ROXICODONE) 5 MG immediate release tablet Take 5 mg by mouth every 6 (six) hours as needed for moderate pain (pain score 4-6).   Yes [provider]  oxyCODONE (OXYCONTIN) 15 mg 12  hr tablet Take 15 mg by mouth in the morning, at noon, and at bedtime.   Yes [provider]  OXYGEN Inhale 2 L into the lungs as needed (to maintain sats above 90/SOB).   Yes [provider]  oxymetazoline (AFRIN) 0.05 % nasal spray Place 1 spray into both nostrils 2 (two) times daily as needed for congestion.   Yes [provider]  pregabalin (LYRICA) 100 MG capsule Take 1 capsule (100 mg total) by mouth 2 (two) times daily. Patient taking differently: Take 100 mg by mouth 3 (three)  times daily. 12/25/22 06/27/23 Yes Dorcas Carrow, MD  pregabalin (LYRICA) 25 MG capsule Take 25 mg by mouth 3 (three) times daily.   Yes [provider]  QUEtiapine (SEROQUEL) 100 MG tablet Take 100 mg by mouth at bedtime.   Yes [provider]  senna (SENOKOT) 8.6 MG TABS tablet Take 2 tablets by mouth at bedtime.   Yes [provider]  sodium bicarbonate 650 MG tablet Take 650 mg by mouth 2 (two) times daily.   Yes [provider]  tamsulosin (FLOMAX) 0.4 MG CAPS capsule Take 2 capsules (0.8 mg total) by mouth daily. Patient taking differently: Take 0.4 mg by mouth daily. 02/09/23  Yes Peterson Ao, MD  tirzepatide [facility]) 2.5 MG/0.5ML Pen Inject 2.5 mg into the skin once a week. On Tuesdays   Yes [provider]  trolamine salicylate (ASPERCREME) 10 % cream Apply 1 Application topically in the morning, at noon, in the evening, and at bedtime. Right leg   Yes [provider]  UNABLE TO FIND Take 30 mLs by mouth in the morning and at bedtime. Med Name: liquid protein   Yes [provider]     Critical care time: 28 min

## 2023-07-08 NOTE — Procedures (Signed)
Modified Barium Swallow Study  Patient Details  Name: Gregory [name] MRN: [id number] Date of Birth: Nov 13, 1960  Today's Date: 07/08/2023  Modified Barium Swallow completed.  Full report located under Chart Review in the Imaging Section.  History of Present Illness Gregory [name] is a 63 year old male who presented to ED from SNF for altered mental status/obtunded.  He was subsequently intubated in the ED.  Required ETT 1/9-1/18. 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   Clinical Impression Patient is presenting with a primary oral dysphagia as per this modified barium swallow study. No penetration or aspiration observed with any of the tested barium consistencies. (Boluses of thin, nectar thick and honey thick liquids, puree solids and 13mm barium tablet were all administered.) Patient did exhibit delayed anterior to posterior transit in oral cavity of all PO's leading to oral residuals on base of tongue, palate as well as pharyngeal residuals in vallecular sinus. A second "dry swallow" was effective to clear majority of residuals. PES opening and barium transit both WFL and no retrograde movement of barium observed. SLP recommending to initiate PO diet of Dys 1 (puree) solids, thin liquids and will follow for ability to advance. Factors that may increase risk of adverse event in presence of aspiration Rubye Oaks & Clearance Coots 2021): Dependence for feeding and/or oral hygiene;Reduced cognitive function  Swallow Evaluation Recommendations Recommendations: PO diet PO Diet Recommendation: Dysphagia 1 (Pureed);Thin liquids (Level 0) Liquid Administration via: Cup;Straw Medication Administration: Whole meds with puree Supervision: Staff to assist with self-feeding;Full assist for feeding;Full supervision/cueing for swallowing  strategies Swallowing strategies  : Slow rate;Small bites/sips Postural changes: Position pt fully upright for meals Oral care recommendations: Oral care BID (2x/day)  Angela Nevin, MA, CCC-SLP Speech Therapy

## 2023-07-08 NOTE — Progress Notes (Signed)
Patient arrived to unit. On isolation precautions. A/Ox4. No complaints of pain. No signs of distress or SOB. Oriented to room Bed alarm on. Vitals documented.

## 2023-07-08 NOTE — Progress Notes (Addendum)
PT Cancellation Note  Patient Details Name: Gregory [name] MRN: [id number] DOB: [birth year]   Cancelled Treatment:    Reason Eval/Treat Not Completed: Patient at procedure or test/unavailable  Current location: Diagnostic radiology  Will follow-up this afternoon as schedule permits.   Kathlyn Sacramento, PT, DPT [facility]  Rehabilitation Services Physical Therapist Office: [phone] Website: [email or web address]  Berton Mount 07/08/2023, 9:30 AM

## 2023-07-08 NOTE — Evaluation (Signed)
Physical Therapy Evaluation Patient Details Name: Gregory [name] MRN: [id number] DOB: [birth year] Today's Date: 07/08/2023  History of Present Illness  ear-old male who presented to ED from SNF for altered mental status/obtunded.  He was subsequently intubated in the ED.  Required ETT 1/9-1/18. 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  Clinical Impression  Pt admitted with above diagnosis. Eager to work with therapy. Hx a bit questionable when inquiring about PLOF at SNF and activities he was working on. Reportedly nearly independent with transfer from bed to w/c v electric scooter but cannot confirm at this time. Demonstrates significant weakness at this time evident by need for up to min assist with bed mobility and max assist to attempt scoot along bed. LLE without enough strength to lift buttocks from bed with attempts to stand, squat, or scoot. Reviewed LE exercises and he is encouraged to perform daily to prevent atrophy from immobility. Will likely require +2 assist and either Stedy or maxi lift for OOB to chair. Will progress as tolerated. Pt currently with functional limitations due to the deficits listed below (see PT Problem List). Pt will benefit from acute skilled PT to increase their independence and safety with mobility to allow discharge.           If plan is discharge home, recommend the following: Two people to help with walking and/or transfers;A lot of help with bathing/dressing/bathroom;Assistance with cooking/housework;Direct supervision/assist for medications management;Direct supervision/assist for financial management;Assist for transportation;Supervision due to cognitive status   Can travel by private vehicle   No    Equipment Recommendations None recommended by PT  Recommendations for  Other Services       Functional Status Assessment Patient has had a recent decline in their functional status and demonstrates the ability to make significant improvements in function in a reasonable and predictable amount of time.     Precautions / Restrictions Precautions Precautions: Fall Restrictions Weight Bearing Restrictions Per Provider Order: No      Mobility  Bed Mobility Overal bed mobility: Needs Assistance Bed Mobility: Rolling, Sidelying to Sit, Sit to Sidelying Rolling: Min assist Sidelying to sit: Mod assist     Sit to sidelying: Contact guard assist General bed mobility comments: Min assist to roll and rise to EOB with support for trunk, max cues for sequencing. CGA to return to supine, lifts his LLE and Rt residual limb into bed well.    Transfers Overall transfer level: Needs assistance   Transfers: Bed to chair/wheelchair/BSC            Lateral/Scoot Transfers: Max assist General transfer comment: Max assist to scoot along edge of bed. Very weak, unable to clear buttocks with initial attempt to stand, and even later with lateral scoot not producing enough force through LLE and UEs to slide without max assist.    Ambulation/Gait                  Stairs            Wheelchair Mobility     Tilt Bed    Modified Rankin (Stroke Patients Only)       Balance Overall balance assessment: Needs assistance Sitting-balance support: No upper extremity supported, Feet supported Sitting balance-Leahy Scale: Fair Sitting balance - Comments: CGA  Pertinent Vitals/Pain Pain Assessment Pain Assessment: No/denies pain    Home Living Family/patient expects to be discharged to:: Skilled nursing facility                   Additional Comments: from [facility]    Prior Function Prior Level of Function : Needs assist             Mobility Comments: States he was working on  transfers OOB to Art gallery manager v wheelchair? ADLs Comments: Needed assist at SNF.     Extremity/Trunk Assessment   Upper Extremity Assessment Upper Extremity Assessment: Defer to OT evaluation    Lower Extremity Assessment Lower Extremity Assessment: Generalized weakness (Hx of Rt AKA)       Communication   Communication Communication: No apparent difficulties  Cognition Arousal: Alert Behavior During Therapy: WFL for tasks assessed/performed Overall Cognitive Status: No family/caregiver present to determine baseline cognitive functioning                                 General Comments: Needs redirected at time, not consistent with following simple commands.        General Comments General comments (skin integrity, edema, etc.): HR 91, SpO2 94% on 2L supplemental O2, RR 21, BP 171/102    Exercises General Exercises - Lower Extremity Ankle Circles/Pumps: AROM, Left, 10 reps, Supine Quad Sets: Strengthening, Left, 10 reps, Supine Gluteal Sets: Strengthening, Both, 10 reps, Supine   Assessment/Plan    PT Assessment Patient needs continued PT services  PT Problem List Decreased strength;Decreased range of motion;Decreased activity tolerance;Decreased balance;Decreased mobility;Decreased cognition;Decreased knowledge of use of DME;Decreased safety awareness;Decreased knowledge of precautions;Cardiopulmonary status limiting activity;Obesity;Decreased skin integrity       PT Treatment Interventions DME instruction;Gait training;Functional mobility training;Therapeutic activities;Therapeutic exercise;Balance training;Neuromuscular re-education;Patient/family education;Cognitive remediation;Wheelchair mobility training    PT Goals (Current goals can be found in the Care Plan section)  Acute Rehab PT Goals Patient Stated Goal: Get back to rehab, get stronger. PT Goal Formulation: With patient Time For Goal Achievement: 07/22/23 Potential to Achieve Goals:  Fair    Frequency Min 1X/week     Co-evaluation               AM-PAC PT "6 Clicks" Mobility  Outcome Measure Help needed turning from your back to your side while in a flat bed without using bedrails?: A Little Help needed moving from lying on your back to sitting on the side of a flat bed without using bedrails?: A Little Help needed moving to and from a bed to a chair (including a wheelchair)?: Total Help needed standing up from a chair using your arms (e.g., wheelchair or bedside chair)?: Total Help needed to walk in hospital room?: Total Help needed climbing 3-5 steps with a railing? : Total 6 Click Score: 10    End of Session Equipment Utilized During Treatment: Gait belt;Oxygen Activity Tolerance: Patient tolerated treatment well Patient left: in bed;with call bell/phone within reach;with bed alarm set Nurse Communication: Mobility status PT Visit Diagnosis: Muscle weakness (generalized) (M62.81);Difficulty in walking, not elsewhere classified (R26.2);Other symptoms and signs involving the nervous system (R29.898)    Time: 9147-8295 PT Time Calculation (min) (ACUTE ONLY): 25 min   Charges:   PT Evaluation $PT Eval Moderate Complexity: 1 Mod PT Treatments $Therapeutic Activity: 8-22 mins PT General Charges $$ ACUTE PT VISIT: 1 Visit  Kathlyn Sacramento, PT, DPT [facility]  Rehabilitation Services Physical Therapist Office: [phone] Website: [email or web address]   Berton Mount 07/08/2023, 5:34 PM

## 2023-07-08 NOTE — Progress Notes (Signed)
   Palliative Medicine Inpatient Follow Up Note   HPI: 63 y.o. 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  He was intubated in the ED for airway protection.  Initial workup revealed elevated creatinine and mild leukocytosis.  CT head was abnormal with concern for acute infarct.  He is admitted with acute encephalopathy, right sided pneumonia, and sepsis.   Palliative Medicine is consulted for goals of care.  Today's Discussion 07/08/2023  *Please note that this is a verbal dictation therefore any spelling or grammatical errors are due to the "[employer]" system interpretation.  Chart reviewed inclusive of vital signs, progress notes, laboratory results, and diagnostic images.   I met with Jimin at bedside this morning, he is in good spirits though is perseverant about "getting ice cubes and water". Skyeler's nurse, Marcelle Smiling shares that he cannot drink until his swallow study is complete. Education provided about the concern for aspiration.   Patient has medically improved and is no longer requiring precedex. Plan will be for him to transition to a lower level of care.   I have called patients daughter, [name] in an effort to update patients MOST form. I have reached her VM and provided a HIPAA compliant VM.   Questions and concerns addressed/Palliative Support Provided.   Objective Assessment: Vital Signs Vitals:   07/08/23 1116 07/08/23 1200  BP:  131/84  Pulse: 82 89  Resp: 19 20  Temp: 98.4 F (36.9 C)   SpO2: 94% 92%    Intake/Output Summary (Last 24 hours) at 07/08/2023 1258 Last data filed at 07/08/2023 1100 Gross per 24 hour  Intake 3279.88 ml  Output 2600 ml  Net 679.88 ml   Last Weight  Most recent update: 07/08/2023  7:25 AM    Weight  80.9 kg (178 lb 5.6 oz)            Gen:  Older  Caucasian M chronically ill appearing HEENT: Coretrack in place, dry mucous membranes CV: Regular rate and rhythm  PULM:  On 2LPM [place], breathing is even and nonlabored ABD: soft/nontender  EXT: RLE AKA Neuro: Awake and alert to self, hospital, date  SUMMARY OF RECOMMENDATIONS   Full Code  Plan for swallow evaluation today --> MBSS  Allowing time for outcomes  Ongoing conversations between PMT and patients daughter in the days ahead --> Will try to complete an updated MOST form  MDM: Moderate ______________________________________________________________________________________ Lamarr Lulas [place] Palliative Medicine Team Team Cell Phone: [phone] Please utilize secure chat with additional questions, if there is no response within 30 minutes please call the above phone number  Palliative Medicine Team providers are available by phone from 7am to 7pm daily and can be reached through the team cell phone.  Should this patient require assistance outside of these hours, please call the patient's attending physician.

## 2023-07-09 DIAGNOSIS — J9601 Acute respiratory failure with hypoxia: Secondary | ICD-10-CM | POA: Diagnosis not present

## 2023-07-09 LAB — BASIC METABOLIC PANEL
Anion gap: 11 (ref 5–15)
BUN: 75 mg/dL — ABNORMAL HIGH (ref 8–23)
CO2: 21 mmol/L — ABNORMAL LOW (ref 22–32)
Calcium: 9.4 mg/dL (ref 8.9–10.3)
Chloride: 105 mmol/L (ref 98–111)
Creatinine, Ser: 1.71 mg/dL — ABNORMAL HIGH (ref 0.61–1.24)
GFR, Estimated: 44 mL/min — ABNORMAL LOW (ref >60)
Glucose, Bld: 156 mg/dL — ABNORMAL HIGH (ref 70–99)
Potassium: 4.6 mmol/L (ref 3.5–5.1)
Sodium: 137 mmol/L (ref 135–145)

## 2023-07-09 LAB — GLUCOSE, CAPILLARY
Glucose-Capillary: 128 mg/dL — ABNORMAL HIGH (ref 70–99)
Glucose-Capillary: 128 mg/dL — ABNORMAL HIGH (ref 70–99)
Glucose-Capillary: 148 mg/dL — ABNORMAL HIGH (ref 70–99)
Glucose-Capillary: 183 mg/dL — ABNORMAL HIGH (ref 70–99)
Glucose-Capillary: 230 mg/dL — ABNORMAL HIGH (ref 70–99)

## 2023-07-09 MED ORDER — SENNA 8.6 MG PO TABS
2.0000 | ORAL_TABLET | Freq: Every day | ORAL | Status: DC
  Administered 2023-07-10: 17.2 mg via ORAL
  Filled 2023-07-09 (×2): qty 2

## 2023-07-09 MED ORDER — OXYCODONE HCL 5 MG PO TABS
15.0000 mg | ORAL_TABLET | Freq: Three times a day (TID) | ORAL | Status: DC | PRN
  Administered 2023-07-09 – 2023-07-11 (×6): 15 mg via ORAL
  Filled 2023-07-09 (×5): qty 3

## 2023-07-09 MED ORDER — QUETIAPINE FUMARATE 100 MG PO TABS
100.0000 mg | ORAL_TABLET | Freq: Every day | ORAL | Status: DC
  Administered 2023-07-10: 100 mg via ORAL
  Filled 2023-07-09 (×2): qty 1

## 2023-07-09 MED ORDER — TAMSULOSIN HCL 0.4 MG PO CAPS
0.8000 mg | ORAL_CAPSULE | Freq: Every day | ORAL | Status: DC
  Administered 2023-07-09 – 2023-07-11 (×3): 0.8 mg via ORAL
  Filled 2023-07-09 (×3): qty 2

## 2023-07-09 MED ORDER — LOPERAMIDE HCL 2 MG PO CAPS
2.0000 mg | ORAL_CAPSULE | ORAL | Status: DC | PRN
  Administered 2023-07-09 – 2023-07-11 (×2): 2 mg via ORAL
  Filled 2023-07-09 (×2): qty 1

## 2023-07-09 MED ORDER — SODIUM BICARBONATE 650 MG PO TABS
650.0000 mg | ORAL_TABLET | Freq: Two times a day (BID) | ORAL | Status: DC
Start: 2023-07-09 — End: 2023-07-11
  Administered 2023-07-09 – 2023-07-11 (×4): 650 mg via ORAL
  Filled 2023-07-09 (×5): qty 1

## 2023-07-09 MED ORDER — MELATONIN 5 MG PO TABS
10.0000 mg | ORAL_TABLET | Freq: Every day | ORAL | Status: DC
  Administered 2023-07-10: 10 mg via ORAL
  Filled 2023-07-09 (×4): qty 2

## 2023-07-09 MED ORDER — ESCITALOPRAM OXALATE 10 MG PO TABS
20.0000 mg | ORAL_TABLET | Freq: Every day | ORAL | Status: DC
  Administered 2023-07-09 – 2023-07-11 (×3): 20 mg via ORAL
  Filled 2023-07-09 (×3): qty 2

## 2023-07-09 MED ORDER — OXYBUTYNIN CHLORIDE ER 5 MG PO TB24
5.0000 mg | ORAL_TABLET | Freq: Every day | ORAL | Status: DC
  Administered 2023-07-10: 5 mg via ORAL
  Filled 2023-07-09 (×3): qty 1

## 2023-07-09 MED ORDER — FLUTICASONE PROPIONATE 50 MCG/ACT NA SUSP
1.0000 | Freq: Every day | NASAL | Status: DC
  Administered 2023-07-09 – 2023-07-11 (×3): 1 via NASAL
  Filled 2023-07-09: qty 16

## 2023-07-09 NOTE — Progress Notes (Addendum)
PROGRESS NOTE    Gregory [name]  ZOX:[id number] DOB: 1961-04-25 DOA: 06/27/2023 PCP: [name], Provider, MD   Brief Narrative:  63 year old male with significant past medical history including diabetes, hypertension, hyperlipidemia, CKDIIIb, COPD, chronic osteomyelitis s/p R AKA, L midfoot amputation, atrial fibrillation, HFpEF, mesenteric ischemia s/p colostomy 12/2022 who presented to ED from SNF for altered mental status/obtunded. Reported increased liquid stool from ostomy. He was subsequently intubated in the ED. Labs in ED notable for K 5.8, Cr 2.6, WBC 13, trop 83>705, BNP 63, UA large leuk, wbc21-50 few bacteria, UDS negative. Admitted to PCCM.  Transferred under [facility] 07/09/2023.  Details below.   Significant Hospital Events: Including procedures, antibiotic start and stop dates in addition to other pertinent events   1/9: admit from ED for AMS, intubated, CVC 1/10: MRI w/ 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 Stroke team/neuro consulted. Recommend stroke work up, d/c meningitis/encephalitis coverage, palliative consult.  1/10 unsuccessful attempts at lumbar puncture 1/12 significant ventilator dyssynchrony, given 1 dose of Nimbex, on Versed drip 1/13 agitated overnight despite fentanyl and versed pushes and Haldol 1/14 remains agitated with sedation wean  1/16 off versed gtt, responds to painful stimuli but not following commands 1/17 awakes more easily, moves all extremities and follows simple commands  1/18 extubated to [facility]  1/19 remained off precedex    Assessment & Plan:   Principal Problem:   Respiratory failure (HCC) Active Problems:   Encephalopathy   Fever   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)  Septic shock 2/2 ESBL UTI and multifocal pneumonia / Rhinovirus infection positive - off pressors since 07/07/2023.  Blood pressure stable.  Completed 7 days of Merrem for UTI.  Trach aspirate growing Klebsiella and  Serratia.  Also completed cefepime for 7 days for this as well.   Acute hypoxemic respiratory failure 2/2 multifocal pneumonia / COPD -Intubated in the ED and extubated 1/18.  Currently on room air.  Completed antibiotics as above. Rosalyn Gess, Pulmicort, Yupelri   Encephalopathy/Agitation/chronic pain: Patient fully alert and mostly oriented. - continue lyrica 125 mg TID -PTA on Seroquel 100 mg nightly however he has been started on seroquel 50 mg bid, I could not find the rationale.  Will transition back to 100 mg nightly/home dose. -Will reduce oxycodone to 15 mg every 8 hours, PTA dose.   AKI on CKD IIIb -Baseline creatinine around 1.8.  Creatinine jumped to 2.8 this hospitalization but improved and currently down to 1.7 which is very close to his baseline.  Has good urine output.  Maintain electrolytes within normal range and avoid nephrotoxic agents.   Normocytic anemia: Hemoglobin stable around 10.  Acute ischemic stroke/acute on chronic right PCA distribution infarct: Patient seen by neurology and they have signed off.  They recommended to continue aspirin alone for CVA prophylaxis and no Plavix due to history of GI bleed in the past.   Chronic heart failure with preserved ejection fraction: His EF was 50 to 55% in June and now it is 60%.  Heart failure with reduced ejection fraction as documented in previous note is ruled out.  His ejection fraction is normal.  He is on Lasix 40 mg PTA which has been resumed.  Appears euvolemic.   HTN/hyperlipidemia: Blood pressure controlled with fluctuations here and there. - continue coreg 12.5 mg bid  - hydralazine 10 mg q8h - lipitor 80 mg daily  - IV labetalol 10 mg q2h PRN for SBP>180   T2DM:  Appears to be taking Lantus 15 units PTA.  Also on Mounjaro.  Hemoglobin A1c 6.4.  Currently blood sugar controlled on SSI.   Nutrition - cortrak/tube feeds stopped 07/08/2023 and started on dysphagia 1 diet, SLP following.  Depression/anxiety:  Resume Lexapro.  BPH: Resume Flomax.  Generalized weakness: Seen by PT OT, SNF recommended.  TOC consulted.  DVT prophylaxis: heparin injection 5,000 Units Start: 06/27/23 0830   Code Status: Full Code  Family Communication:  None present at bedside.  Plan of care discussed with patient in length and he/she verbalized understanding and agreed with it.  Status is: Inpatient Remains inpatient appropriate because: Patient near medically stable, may be ready for discharge in 1 to 2 days however he needs SNF discharge, TOC aware.   Estimated body mass index is 24.18 kg/m as calculated from the following:   Height as of this encounter: 6' 0.01" (1.829 m).   Weight as of this encounter: 80.9 kg.    Nutritional Assessment: Body mass index is 24.18 kg/m.Marland Kitchen Seen by dietician.  I agree with the assessment and plan as outlined below: Nutrition Status: Nutrition Problem: Inadequate oral intake Etiology: inability to eat Signs/Symptoms: NPO status Interventions: Tube feeding, Refer to RD note for recommendations  . Skin Assessment: I have examined the patient's skin and I agree with the wound assessment as performed by the wound care RN as outlined below:    Consultants:  Neurology-signed off PCCM-signed off  Procedures:  As above  Antimicrobials:  Anti-infectives (From admission, onward)    Start     Dose/Rate Route Frequency Ordered Stop   07/01/23 2100  ceFEPIme (MAXIPIME) 2 g in sodium chloride 0.9 % 100 mL IVPB        2 g 200 mL/hr over 30 Minutes Intravenous Every 12 hours 07/01/23 1352 07/05/23 2208   06/29/23 1430  doxycycline (VIBRA-TABS) tablet 100 mg  Status:  Discontinued        100 mg Per Tube Every 12 hours 06/29/23 1338 07/01/23 1033   06/29/23 1245  meropenem (MERREM) 2,000 mg in sodium chloride 0.9 % 100 mL IVPB  Status:  Discontinued        2,000 mg 280 mL/hr over 30 Minutes Intravenous Every 12 hours 06/29/23 1154 07/01/23 1352   06/28/23 2000   piperacillin-tazobactam (ZOSYN) IVPB 3.375 g  Status:  Discontinued        3.375 g 12.5 mL/hr over 240 Minutes Intravenous Every 8 hours 06/28/23 1257 06/29/23 1154   06/28/23 1945  acyclovir (ZOVIRAX) 900 mg in dextrose 5 % 250 mL IVPB  Status:  Discontinued        900 mg 268 mL/hr over 60 Minutes Intravenous Every 24 hours 06/28/23 1856 06/29/23 1331   06/28/23 0900  piperacillin-tazobactam (ZOSYN) IVPB 3.375 g  Status:  Discontinued        3.375 g 12.5 mL/hr over 240 Minutes Intravenous Every 8 hours 06/28/23 0807 06/28/23 1257   06/28/23 0900  vancomycin (VANCOCIN) IVPB 1000 mg/200 mL premix        1,000 mg 200 mL/hr over 60 Minutes Intravenous  Once 06/28/23 0812 06/28/23 1048   06/27/23 2100  metroNIDAZOLE (FLAGYL) IVPB 500 mg  Status:  Discontinued        500 mg 100 mL/hr over 60 Minutes Intravenous Every 12 hours 06/27/23 1729 06/28/23 0807   06/27/23 2000  cefTRIAXone (ROCEPHIN) 2 g in sodium chloride 0.9 % 100 mL IVPB  Status:  Discontinued        2  g 200 mL/hr over 30 Minutes Intravenous Every 12 hours 06/27/23 1729 06/28/23 0807   06/27/23 1930  ampicillin (OMNIPEN) 2 g in sodium chloride 0.9 % 100 mL IVPB  Status:  Discontinued        2 g 300 mL/hr over 20 Minutes Intravenous Every 8 hours 06/27/23 1729 06/28/23 0807   06/27/23 1845  acyclovir (ZOVIRAX) 800 mg in dextrose 5 % 250 mL IVPB  Status:  Discontinued        800 mg 266 mL/hr over 60 Minutes Intravenous Every 24 hours 06/27/23 1759 06/28/23 0807   06/27/23 1830  azithromycin (ZITHROMAX) 500 mg in sodium chloride 0.9 % 250 mL IVPB        500 mg 250 mL/hr over 60 Minutes Intravenous Every 24 hours 06/27/23 1739 06/30/23 0148   06/27/23 1400  piperacillin-tazobactam (ZOSYN) IVPB 3.375 g  Status:  Discontinued        3.375 g 12.5 mL/hr over 240 Minutes Intravenous Every 8 hours 06/27/23 0851 06/27/23 1729   06/27/23 0900  piperacillin-tazobactam (ZOSYN) IVPB 3.375 g        3.375 g 100 mL/hr over 30 Minutes Intravenous   Once 06/27/23 0850 06/27/23 0950   06/27/23 0900  vancomycin (VANCOCIN) IVPB 1000 mg/200 mL premix       Placed in "And" Linked Group   1,000 mg 200 mL/hr over 60 Minutes Intravenous  Once 06/27/23 0850 06/27/23 1026   06/27/23 0900  vancomycin (VANCOCIN) IVPB 1000 mg/200 mL premix       Placed in "And" Linked Group   1,000 mg 200 mL/hr over 60 Minutes Intravenous  Once 06/27/23 0850 06/27/23 1023   06/27/23 0851  vancomycin variable dose per unstable renal function (pharmacist dosing)  Status:  Discontinued         Does not apply See admin instructions 06/27/23 0851 06/29/23 1331   06/27/23 0730  Ampicillin-Sulbactam (UNASYN) 3 g in sodium chloride 0.9 % 100 mL IVPB  Status:  Discontinued        3 g 200 mL/hr over 30 Minutes Intravenous  Once 06/27/23 0722 06/27/23 0850         Subjective: Patient seen and examined.  He has no complaints.  He is fully alert and oriented.  Objective: Vitals:   07/09/23 0500 07/09/23 0534 07/09/23 0757 07/09/23 0858  BP: (!) 144/98 (!) 144/98  (!) 140/80  Pulse: 71  73 69  Resp: 18  16 16   Temp: 97.7 F (36.5 C)   98.3 F (36.8 C)  TempSrc: Oral   Oral  SpO2: 98%  93% 98%  Weight:      Height:        Intake/Output Summary (Last 24 hours) at 07/09/2023 1015 Last data filed at 07/09/2023 0541 Gross per 24 hour  Intake 1080 ml  Output 1850 ml  Net -770 ml   Filed Weights   07/05/23 0430 07/07/23 0500 07/08/23 0500  Weight: 87.4 kg 90.4 kg 80.9 kg    Examination:  General exam: Appears calm and comfortable  Respiratory system: Rhonchi bilateral.  No wheezes. Cardiovascular system: S1 & S2 heard, RRR. No JVD, murmurs, rubs, gallops or clicks. No pedal edema. Gastrointestinal system: Abdomen is nondistended, soft and nontender. No organomegaly or masses felt. Normal bowel sounds heard. Central nervous system: Alert and oriented. No focal neurological deficits. Extremities: R AKA, L midfoot amputation; left leg with edema and scabs   Skin: No rashes, lesions or ulcers Psychiatry: Judgement and insight appear normal. Mood &  affect appropriate.    Data Reviewed: I have personally reviewed following labs and imaging studies  CBC: Recent Labs  Lab 07/03/23 0405 07/04/23 0451 07/05/23 1043 07/07/23 0208  WBC 7.5 6.2 7.0 12.2*  NEUTROABS 3.7  --   --   --   HGB 8.5* 8.3* 9.0* 10.9*  HCT 27.3* 26.6* 29.2* 34.6*  MCV 91.3 93.0 92.1 89.4  PLT 151 155 166 295   Basic Metabolic Panel: Recent Labs  Lab 07/03/23 0405 07/04/23 0451 07/05/23 1043 07/06/23 0251 07/07/23 0208 07/08/23 0212 07/09/23 0553  NA 149*   < > 145 145 142 141 137  K 4.7   < > 5.3* 5.4* 5.0 5.0 4.6  CL 117*   < > 118* 118* 111 109 105  CO2 24   < > 21* 20* 19* 21* 21*  GLUCOSE 101*   < > 268* 236* 218* 223* 156*  BUN 69*   < > 91* 102* 96* 83* 75*  CREATININE 1.50*   < > 1.64* 1.72* 1.65* 1.68* 1.71*  CALCIUM 9.1   < > 9.1 9.2 9.4 9.7 9.4  MG 1.9  --   --  2.2  --   --   --   PHOS 4.0  --   --  4.9*  --   --   --    < > = values in this interval not displayed.   GFR: Estimated Creatinine Clearance: 47.9 mL/min (A) (by C-G formula based on SCr of 1.71 mg/dL (H)). Liver Function Tests: No results for input(s): "AST", "ALT", "ALKPHOS", "BILITOT", "PROT", "ALBUMIN" in the last 168 hours. No results for input(s): "LIPASE", "AMYLASE" in the last 168 hours. No results for input(s): "AMMONIA" in the last 168 hours. Coagulation Profile: No results for input(s): "INR", "PROTIME" in the last 168 hours. Cardiac Enzymes: No results for input(s): "CKTOTAL", "CKMB", "CKMBINDEX", "TROPONINI" in the last 168 hours. BNP (last 3 results) No results for input(s): "PROBNP" in the last 8760 hours. HbA1C: No results for input(s): "HGBA1C" in the last 72 hours. CBG: Recent Labs  Lab 07/08/23 0729 07/08/23 1115 07/08/23 1558 07/09/23 0050 07/09/23 0518  GLUCAP 197* 232* 158* 148* 128*   Lipid Profile: No results for input(s): "CHOL", "HDL",  "LDLCALC", "TRIG", "CHOLHDL", "LDLDIRECT" in the last 72 hours. Thyroid Function Tests: No results for input(s): "TSH", "T4TOTAL", "FREET4", "T3FREE", "THYROIDAB" in the last 72 hours. Anemia Panel: No results for input(s): "VITAMINB12", "FOLATE", "FERRITIN", "TIBC", "IRON", "RETICCTPCT" in the last 72 hours. Sepsis Labs: No results for input(s): "PROCALCITON", "LATICACIDVEN" in the last 168 hours.  No results found for this or any previous visit (from the past 240 hours).   Radiology Studies: DG Swallowing Func-Speech Pathology Result Date: 07/08/2023 Table formatting from the original result was not included. Modified Barium Swallow Study Patient Details Name: RONYN DZIADOSZ MRN: [id number] Date of Birth: Feb 11, 1961 Today's Date: 07/08/2023 HPI/PMH: HPI: Hassani Lambrou is a 63 year old male who presented to ED from SNF for altered mental status/obtunded.  He was subsequently intubated in the ED.  Required ETT 1/9-1/18. 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 Clinical Impression: Clinical Impression: Patient is presenting with a primary oral dysphagia as per this modified barium swallow study. No penetration or aspiration observed with any of the tested barium consistencies. (Boluses of thin, nectar thick and honey thick liquids, puree solids and 13mm barium tablet were all administered.)  Patient did exhibit delayed anterior to posterior transit in oral cavity of all PO's leading to oral residuals on base of tongue, palate as well as pharyngeal residuals in vallecular sinus. A second "dry swallow" was effective to clear majority of residuals. PES opening and barium transit both WFL and no retrograde movement of barium observed. SLP recommending to initiate PO diet of Dys 1 (puree) solids, thin liquids and will follow for  ability to advance. Factors that may increase risk of adverse event in presence of aspiration Rubye Oaks & Clearance Coots 2021): Factors that may increase risk of adverse event in presence of aspiration Rubye Oaks & Clearance Coots 2021): Dependence for feeding and/or oral hygiene; Reduced cognitive function Recommendations/Plan: Swallowing Evaluation Recommendations Swallowing Evaluation Recommendations Recommendations: PO diet PO Diet Recommendation: Dysphagia 1 (Pureed); Thin liquids (Level 0) Liquid Administration via: Cup; Straw Medication Administration: Whole meds with puree Supervision: Staff to assist with self-feeding; Full assist for feeding; Full supervision/cueing for swallowing strategies Swallowing strategies  : Slow rate; Small bites/sips Postural changes: Position pt fully upright for meals Oral care recommendations: Oral care BID (2x/day) Treatment Plan Treatment Plan Treatment recommendations: Therapy as outlined in treatment plan below Follow-up recommendations: Other (comment) (TBD (SNF SLP vs no SLP)) Functional status assessment: Patient has had a recent decline in their functional status and demonstrates the ability to make significant improvements in function in a reasonable and predictable amount of time. Treatment frequency: Min 2x/week Treatment duration: 1 week Interventions: Trials of upgraded texture/liquids; Aspiration precaution training; Diet toleration management by SLP; Patient/family education Recommendations Recommendations for follow up therapy are one component of a multi-disciplinary discharge planning process, led by the attending physician.  Recommendations may be updated based on patient status, additional functional criteria and insurance authorization. Assessment: Orofacial Exam: Orofacial Exam Oral Cavity: Oral Hygiene: WFL Oral Cavity - Dentition: Edentulous Orofacial Anatomy: WFL Oral Motor/Sensory Function: WFL Anatomy: Anatomy: Presence of cervical hardware Boluses Administered: Boluses  Administered Boluses Administered: Thin liquids (Level 0); Mildly thick liquids (Level 2, nectar thick); Moderately thick liquids (Level 3, honey thick); Puree  Oral Impairment Domain: Oral Impairment Domain Lip Closure: No labial escape Tongue control during bolus hold: Not tested Bolus transport/lingual motion: Slow tongue motion Oral residue: Residue collection on oral structures Location of oral residue : Tongue; Palate Initiation of pharyngeal swallow : Valleculae  Pharyngeal Impairment Domain: Pharyngeal Impairment Domain Soft palate elevation: No bolus between soft palate (SP)/pharyngeal wall (PW) Laryngeal elevation: Partial superior movement of thyroid cartilage/partial approximation of arytenoids to epiglottic petiole Anterior hyoid excursion: Partial anterior movement Epiglottic movement: Complete inversion Laryngeal vestibule closure: Complete, no air/contrast in laryngeal vestibule Pharyngeal stripping wave : Present - complete Pharyngeal contraction (A/P view only): N/A Pharyngoesophageal segment opening: Complete distension and complete duration, no obstruction of flow Tongue base retraction: Trace column of contrast or air between tongue base and PPW Pharyngeal residue: Trace residue within or on pharyngeal structures Location of pharyngeal residue: Tongue base; Valleculae  Esophageal Impairment Domain: Esophageal Impairment Domain Esophageal clearance upright position: Complete clearance, esophageal coating Pill: Pill Consistency administered: Thin liquids (Level 0) Thin liquids (Level 0): Impaired (see clinical impressions) Penetration/Aspiration Scale Score: Penetration/Aspiration Scale Score 1.  Material does not enter airway: Thin liquids (Level 0); Mildly thick liquids (Level 2, nectar thick); Moderately thick liquids (Level 3, honey thick); Puree Compensatory Strategies: Compensatory Strategies Compensatory strategies: Yes Straw: Effective Effective Straw: Thin liquid (Level 0)   General  Information: No data recorded Diet Prior to this Study: NPO; Cortrak/Small bore NG tube  Temperature : Normal   Respiratory Status: WFL   Supplemental O2: Nasal cannula   History of Recent Intubation: Yes  Behavior/Cognition: Alert; Requires cueing Self-Feeding Abilities: Needs assist with self-feeding Baseline vocal quality/speech: Hypophonia/low volume; Dysphonic Volitional Cough: Able to elicit Volitional Swallow: Able to elicit Exam Limitations: Other (comment) (mild decreased view due to patient unable to to achieve fully upright position likely secondary to h/o cervical fusion) Goal Planning: Prognosis for improved oropharyngeal function: Good No data recorded No data recorded Patient/Family Stated Goal: requesting water Consulted and agree with results and recommendations: Patient Pain: Pain Assessment Pain Assessment: No/denies pain Facial Expression: 0 Body Movements: 0 Muscle Tension: 0 Compliance with ventilator (intubated pts.): N/A Vocalization (extubated pts.): 0 CPOT Total: 0 End of Session: Start Time:SLP Start Time (ACUTE ONLY): 1305 Stop Time: SLP Stop Time (ACUTE ONLY): 1320 Time Calculation:SLP Time Calculation (min) (ACUTE ONLY): 15 min Charges: SLP Evaluations $ SLP Speech Visit: 1 Visit SLP Evaluations $BSS Swallow: 1 Procedure $MBS Swallow: 1 Procedure SLP visit diagnosis: SLP Visit Diagnosis: Dysphagia, oropharyngeal phase (R13.12) Past Medical History: Past Medical History: Diagnosis Date  Acquired absence of left great toe (HCC)   Acquired absence of right leg below knee (HCC)   Acute osteomyelitis of calcaneum, right (HCC) 10/02/2016  Acute respiratory failure (HCC)   Amputation stump infection (HCC) 08/12/2018  Anemia   Basal cell carcinoma, eyelid   Cancer (HCC)   Candidiasis 08/12/2018  CHF (congestive heart failure) (HCC)   chronic diastolic   Chronic back pain   Chronic kidney disease   stage 3   Chronic multifocal osteomyelitis (HCC)   of ankle and foot   Chronic pain syndrome    COPD (chronic obstructive pulmonary disease) (HCC)   DDD (degenerative disc disease), lumbar   Depression   major depressive disorder   Diabetes mellitus without complication (HCC)   type 2   Diabetic ulcer of toe of left foot (HCC) 10/02/2016  Difficult intubation   Difficulty in walking, not elsewhere classified   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  Foot drop, bilateral   Foot drop, right 12/27/2015  GERD (gastroesophageal reflux disease)   Herpesviral vesicular dermatitis   History of COVID-19 03/15/2022  Hyperlipidemia   Hyperlipidemia   Hypertension   Insomnia   Malignant melanoma of other parts of face (HCC)   Melanoma (HCC)   MRSA bacteremia   Muscle weakness (generalized)   Nasal congestion   Necrosis of toe (HCC) 07/08/2018  Neuromuscular dysfunction of bladder   Osteomyelitis (HCC)   Osteomyelitis (HCC)   Osteomyelitis (HCC)   right BKA  Other idiopathic peripheral autonomic neuropathy   Retention of urine, unspecified   Squamous cell carcinoma of skin   Stroke (HCC)   Unsteadiness on feet   Urinary retention   Urinary retention   Urinary tract infection   Wears dentures   Wears glasses  Past Surgical History: Past Surgical History: Procedure Laterality Date  AMPUTATION Left 03/29/2017  Procedure: LEFT GREAT TOE AMPUTATION AT METATARSOPHALANGEAL JOINT;  Surgeon: Nadara Mustard, MD;  Location: [facility] OR;  Service: Orthopedics;  Laterality: Left;  AMPUTATION Right 03/29/2017  Procedure: RIGHT BELOW KNEE AMPUTATION;  Surgeon: Nadara Mustard, MD;  Location: [facility] OR;  Service: Orthopedics;  Laterality: Right;  AMPUTATION Left 06/06/2018  Procedure: LEFT 2ND TOE AMPUTATION;  Surgeon: Nadara Mustard, MD;  Location: [facility] OR;  Service: Orthopedics;  Laterality: Left;  AMPUTATION Right 11/19/2018  Procedure: AMPUTATION ABOVE KNEE;  Surgeon: Nadara Mustard, MD;  Location: [facility] OR;  Service: Orthopedics;  Laterality: Right;  ANTERIOR CERVICAL CORPECTOMY N/A 11/25/2015  Procedure: ANTERIOR CERVICAL FIVE CORPECTOMY Cervical four - six  fusion;  Surgeon: Lisbeth Renshaw, MD;  Location: [facility];  Service: Neurosurgery;  Laterality: N/A;  ANTERIOR CERVICAL FIVE CORPECTOMY Cervical four - six fusion  APPLICATION OF WOUND VAC Right 10/22/2018  Procedure: Application Of Wound Vac;  Surgeon: Nadara Mustard, MD;  Location: [facility] OR;  Service: Orthopedics;  Laterality: Right;  CYSTOSCOPY WITH BIOPSY N/A 05/01/2022  Procedure: CYSTOSCOPY WITH URETHRAL BIOPSY;  Surgeon: Noel Christmas, MD;  Location: [facility] ORS;  Service: Urology;  Laterality: N/A;  1 HR  CYSTOSCOPY WITH RETROGRADE URETHROGRAM N/A 04/25/2018  Procedure: CYSTOSCOPY WITH RETROGRADE URETHROGRAM/ BALLOON DILATION;  Surgeon: Ihor Gully, MD;  Location: [facility] ORS;  Service: Urology;  Laterality: N/A;  CYSTOSCOPY WITH URETHRAL DILATATION N/A 05/01/2022  Procedure: BALLOON DILATION WITH  OPTILUME;  Surgeon: Noel Christmas, MD;  Location: [facility] ORS;  Service: Urology;  Laterality: N/A;  LAPAROTOMY N/A 12/03/2022  Procedure: EXPLORATORY LAPAROTOMY  total abdominal colectomy with end ileostomy;  Surgeon: Kinsinger, De Blanch, MD;  Location: [facility] OR;  Service: General;  Laterality: N/A;  MULTIPLE TOOTH EXTRACTIONS    POSTERIOR CERVICAL FUSION/FORAMINOTOMY N/A 11/29/2015  Procedure: Cervical Three-Cervical Seven Posterior Cervical Laminectomy with Fusion;  Surgeon: Lisbeth Renshaw, MD;  Location: [facility];  Service: Neurosurgery;  Laterality: N/A;  Cervical Three-Cervical Seven Posterior Cervical Laminectomy with Fusion  STUMP REVISION Right 10/22/2018  Procedure: REVISION RIGHT BELOW KNEE AMPUTATION;  Surgeon: Nadara Mustard, MD;  Location: [facility] OR;  Service: Orthopedics;  Laterality: Right;  STUMP REVISION Right 12/05/2018  Procedure: Revision Right Above Knee Amputation;  Surgeon: Nadara Mustard, MD;  Location: [facility] OR;  Service: Orthopedics;  Laterality: Right;  STUMP REVISION Right 05/29/2019  Procedure: REVISION RIGHT ABOVE KNEE AMPUTATION;  Surgeon: Nadara Mustard, MD;  Location: [facility] OR;  Service:  Orthopedics;  Laterality: Right;  STUMP REVISION Right 06/24/2019  Procedure: STUMP REVISION;  Surgeon: Nadara Mustard, MD;  Location: [facility] OR;  Service: Orthopedics;  Laterality: Right; Angela Nevin, MA, CCC-SLP Speech Therapy    Scheduled Meds:  arformoterol  15 mcg Nebulization BID   aspirin  81 mg Oral Daily   atorvastatin  80 mg Oral Daily   budesonide (PULMICORT) nebulizer solution  0.25 mg Nebulization BID   carvedilol  12.5 mg Oral BID WC   Chlorhexidine Gluconate Cloth  6 each Topical Q2000   famotidine  20 mg Oral Daily   furosemide  40 mg Per Tube Daily   heparin  5,000 Units Subcutaneous Q8H   hydrALAZINE  10 mg Oral Q8H   insulin aspart  0-15 Units Subcutaneous TID WC   labetalol  5 mg Intravenous Once   nutrition supplement (JUVEN)  1 packet Oral BID BM   oxyCODONE  15 mg Oral Q6H   pregabalin  125 mg Oral TID   QUEtiapine  50 mg Oral BID   revefenacin  175 mcg Nebulization Daily   sodium chloride flush  10-40 mL Intracatheter Q12H   Continuous Infusions:   LOS: 12 days   Hughie Closs, MD [facility]  07/09/2023, 10:15 AM   *Please note that this is a verbal dictation therefore any spelling or grammatical errors are due to the "Dragon Medical One" system interpretation.  Please page via Amion and do not message via secure chat for urgent patient care matters. Secure chat can be used for non urgent patient care matters.  How to contact the [facility] Attending or Consulting provider 7A - 7P or covering provider during after hours 7P -7A, for this patient?  Check the care team in [facility] and look for a) attending/consulting TRH provider listed and b) the [facility] team listed. Page or secure chat 7A-7P. Log into [email or web address] and use [place]'s universal password to access. If you do not have the password, please contact the hospital operator. Locate the [facility] provider you are looking for under [facility] and page to a number that you can be directly reached. If you  still have difficulty reaching the provider, please page the [facility] (Director on Call) for the Hospitalists listed on amion for assistance.

## 2023-07-09 NOTE — Progress Notes (Signed)
Speech Language Pathology Treatment: Dysphagia  Patient Details Name: Gregory [name] MRN: [id number] DOB: [birth year] Today's Date: 07/09/2023 Time: 1010-1025 SLP Time Calculation (min) (ACUTE ONLY): 15 min  Assessment / Plan / Recommendation Clinical Impression  Patient seen by SLP for skilled treatment focused  on dysphagia goals. He was awake, alert. He told SLP that he has top dentures but does not need to wear them to eat most foods. SLP assisted with raising HOB and setting up food items on tray table (graham crackers, diet soda, diced canned peaches). Patient able to feed self but did benefit from some assistance initially with using spoon to eat peaches. No overt s/s aspiration and mastication and oral clearance were WFL. SLP recommending to advanced to Dys 3 (mechanical soft) solids and continue with thin liquids. Plan to follow up one more time to ensure toleration.    HPI HPI: Gregory [name] is a 63 year old male who presented to ED from SNF for altered mental status/obtunded.  He was subsequently intubated in the ED.  Required ETT 1/9-1/18. 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      SLP Plan  Continue with current plan of care      Recommendations for follow up therapy are one component of a multi-disciplinary discharge planning process, led by the attending physician.  Recommendations may be updated based on patient status, additional functional criteria and insurance authorization.    Recommendations  Diet recommendations: Dysphagia 3 (mechanical soft);Thin liquid Liquids provided via: Cup;Straw Medication Administration: Whole meds with liquid Supervision: Patient able to self feed;Staff to assist with self feeding Compensations: Slow rate;Small sips/bites Postural Changes and/or Swallow  Maneuvers: Seated upright 90 degrees                  Oral care BID   Set up Supervision/Assistance Dysphagia, oropharyngeal phase (R13.12)     Continue with current plan of care    Angela Nevin, MA, CCC-SLP Speech Therapy

## 2023-07-09 NOTE — Progress Notes (Signed)
Pt declines PIV at this time- primary RN notified

## 2023-07-09 NOTE — Plan of Care (Signed)
Problem: Safety: Goal: Non-violent Restraint(s) Outcome: Progressing   Problem: Education: Goal: Ability to describe self-care measures that may prevent or decrease complications (Diabetes Survival Skills Education) will improve Outcome: Progressing   Problem: Coping: Goal: Ability to adjust to condition or change in health will improve Outcome: Progressing   Problem: Fluid Volume: Goal: Ability to maintain a balanced intake and output will improve Outcome: Progressing   Problem: Health Behavior/Discharge Planning: Goal: Ability to identify and utilize available resources and services will improve Outcome: Progressing Goal: Ability to manage health-related needs will improve Outcome: Progressing   Problem: Metabolic: Goal: Ability to maintain appropriate glucose levels will improve Outcome: Progressing   Problem: Nutritional: Goal: Maintenance of adequate nutrition will improve Outcome: Progressing Goal: Progress toward achieving an optimal weight will improve Outcome: Progressing   Problem: Skin Integrity: Goal: Risk for impaired skin integrity will decrease Outcome: Progressing   Problem: Tissue Perfusion: Goal: Adequacy of tissue perfusion will improve Outcome: Progressing   Problem: Education: Goal: Knowledge of General Education information will improve Description: Including pain rating scale, medication(s)/side effects and non-pharmacologic comfort measures Outcome: Progressing   Problem: Health Behavior/Discharge Planning: Goal: Ability to manage health-related needs will improve Outcome: Progressing   Problem: Clinical Measurements: Goal: Ability to maintain clinical measurements within normal limits will improve Outcome: Progressing Goal: Will remain free from infection Outcome: Progressing Goal: Diagnostic test results will improve Outcome: Progressing Goal: Respiratory complications will improve Outcome: Progressing Goal: Cardiovascular  complication will be avoided Outcome: Progressing   Problem: Activity: Goal: Risk for activity intolerance will decrease Outcome: Progressing   Problem: Nutrition: Goal: Adequate nutrition will be maintained Outcome: Progressing   Problem: Coping: Goal: Level of anxiety will decrease Outcome: Progressing   Problem: Elimination: Goal: Will not experience complications related to bowel motility Outcome: Progressing Goal: Will not experience complications related to urinary retention Outcome: Progressing   Problem: Pain Management: Goal: General experience of comfort will improve Outcome: Progressing   Problem: Safety: Goal: Ability to remain free from injury will improve Outcome: Progressing   Problem: Skin Integrity: Goal: Risk for impaired skin integrity will decrease Outcome: Progressing   Problem: Education: Goal: Knowledge of disease or condition will improve Outcome: Progressing Goal: Knowledge of secondary prevention will improve (MUST DOCUMENT ALL) Outcome: Progressing Goal: Knowledge of patient specific risk factors will improve Loraine Leriche N/A or DELETE if not current risk factor) Outcome: Progressing   Problem: Ischemic Stroke/TIA Tissue Perfusion: Goal: Complications of ischemic stroke/TIA will be minimized Outcome: Progressing   Problem: Coping: Goal: Will verbalize positive feelings about self Outcome: Progressing Goal: Will identify appropriate support needs Outcome: Progressing   Problem: Health Behavior/Discharge Planning: Goal: Ability to manage health-related needs will improve Outcome: Progressing Goal: Goals will be collaboratively established with patient/family Outcome: Progressing   Problem: Self-Care: Goal: Ability to participate in self-care as condition permits will improve Outcome: Progressing Goal: Verbalization of feelings and concerns over difficulty with self-care will improve Outcome: Progressing Goal: Ability to communicate needs  accurately will improve Outcome: Progressing   Problem: Nutrition: Goal: Risk of aspiration will decrease Outcome: Progressing Goal: Dietary intake will improve Outcome: Progressing   Problem: Education: Goal: Knowledge of disease or condition will improve Outcome: Progressing Goal: Knowledge of secondary prevention will improve (MUST DOCUMENT ALL) Outcome: Progressing Goal: Knowledge of patient specific risk factors will improve Loraine Leriche N/A or DELETE if not current risk factor) Outcome: Progressing   Problem: Ischemic Stroke/TIA Tissue Perfusion: Goal: Complications of ischemic stroke/TIA will be minimized Outcome: Progressing   Problem:  Coping: Goal: Will verbalize positive feelings about self Outcome: Progressing Goal: Will identify appropriate support needs Outcome: Progressing   Problem: Health Behavior/Discharge Planning: Goal: Ability to manage health-related needs will improve Outcome: Progressing Goal: Goals will be collaboratively established with patient/family Outcome: Progressing

## 2023-07-09 NOTE — Progress Notes (Signed)
   Palliative Medicine Inpatient Follow Up Note HPI: 63 y.o. 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  He was intubated in the ED for airway protection.  Initial workup revealed elevated creatinine and mild leukocytosis.  CT head was abnormal with concern for acute infarct.  He is admitted with acute encephalopathy, right sided pneumonia, and sepsis.   Palliative Medicine is consulted for goals of care.  Today's Discussion 07/09/2023  *Please note that this is a verbal dictation therefore any spelling or grammatical errors are due to the "Dragon Medical One" system interpretation.  Chart reviewed inclusive of vital signs, progress notes, laboratory results, and diagnostic images. Eating and drinking well.   I met with Trashawn this morning. He shares that he is feeling much better. He is oriented to person, place, time, and situation. He shares, "I almost died". He is very thankful that he has pulled through to this point.   Per patients RN - there are not acute concerns to mention at this time.   I called and spoke to patients daughter, [name]. We plan to complete the MOST form with one another tomorrow at 12:30PM.   Plan to return to [facility] once medically optimized.   Questions and concerns addressed/Palliative Support Provided.   Objective Assessment: Vital Signs Vitals:   07/09/23 0757 07/09/23 0858  BP:  (!) 140/80  Pulse: 73 69  Resp: 16 16  Temp:  98.3 F (36.8 C)  SpO2: 93% 98%    Intake/Output Summary (Last 24 hours) at 07/09/2023 0956 Last data filed at 07/09/2023 0541 Gross per 24 hour  Intake 1080 ml  Output 1850 ml  Net -770 ml   Last Weight  Most recent update: 07/08/2023  7:25 AM    Weight  80.9 kg (178 lb 5.6 oz)            Gen:  Older Caucasian M chronically ill  appearing HEENT:  moist mucous membranes CV: Regular rate and rhythm  PULM:  On RA, breathing is even and nonlabored ABD: soft/nontender  EXT: RLE AKA Neuro: Awake and alert to self, hospital, date  SUMMARY OF RECOMMENDATIONS   Full Code  Allowing time for outcomes  Ongoing conversations between PMT and patients daughter in the days ahead --> Will try to complete an updated MOST form tomorrow  Plan to transition to [employer] once stabilized  Time: 27 ______________________________________________________________________________________ Lamarr Lulas [place] Palliative Medicine Team Team Cell Phone: [phone] Please utilize secure chat with additional questions, if there is no response within 30 minutes please call the above phone number  Palliative Medicine Team providers are available by phone from 7am to 7pm daily and can be reached through the team cell phone.  Should this patient require assistance outside of these hours, please call the patient's attending physician.

## 2023-07-10 DIAGNOSIS — Z789 Other specified health status: Secondary | ICD-10-CM

## 2023-07-10 LAB — BASIC METABOLIC PANEL
Anion gap: 11 (ref 5–15)
BUN: 79 mg/dL — ABNORMAL HIGH (ref 8–23)
CO2: 19 mmol/L — ABNORMAL LOW (ref 22–32)
Calcium: 9.1 mg/dL (ref 8.9–10.3)
Chloride: 103 mmol/L (ref 98–111)
Creatinine, Ser: 1.84 mg/dL — ABNORMAL HIGH (ref 0.61–1.24)
GFR, Estimated: 40 mL/min — ABNORMAL LOW (ref >60)
Glucose, Bld: 137 mg/dL — ABNORMAL HIGH (ref 70–99)
Potassium: 4.9 mmol/L (ref 3.5–5.1)
Sodium: 133 mmol/L — ABNORMAL LOW (ref 135–145)

## 2023-07-10 LAB — GLUCOSE, CAPILLARY
Glucose-Capillary: 115 mg/dL — ABNORMAL HIGH (ref 70–99)
Glucose-Capillary: 143 mg/dL — ABNORMAL HIGH (ref 70–99)
Glucose-Capillary: 138 mg/dL — ABNORMAL HIGH (ref 70–99)
Glucose-Capillary: 213 mg/dL — ABNORMAL HIGH (ref 70–99)
Glucose-Capillary: 134 mg/dL — ABNORMAL HIGH (ref 70–99)
Glucose-Capillary: 146 mg/dL — ABNORMAL HIGH (ref 70–99)

## 2023-07-10 NOTE — Plan of Care (Signed)
Problem: Safety: Goal: Non-violent Restraint(s) Outcome: Progressing   Problem: Education: Goal: Ability to describe self-care measures that may prevent or decrease complications (Diabetes Survival Skills Education) will improve Outcome: Progressing   Problem: Coping: Goal: Ability to adjust to condition or change in health will improve Outcome: Progressing   Problem: Fluid Volume: Goal: Ability to maintain a balanced intake and output will improve Outcome: Progressing   Problem: Health Behavior/Discharge Planning: Goal: Ability to identify and utilize available resources and services will improve Outcome: Progressing Goal: Ability to manage health-related needs will improve Outcome: Progressing   Problem: Metabolic: Goal: Ability to maintain appropriate glucose levels will improve Outcome: Progressing   Problem: Nutritional: Goal: Maintenance of adequate nutrition will improve Outcome: Progressing Goal: Progress toward achieving an optimal weight will improve Outcome: Progressing   Problem: Skin Integrity: Goal: Risk for impaired skin integrity will decrease Outcome: Progressing   Problem: Tissue Perfusion: Goal: Adequacy of tissue perfusion will improve Outcome: Progressing   Problem: Education: Goal: Knowledge of General Education information will improve Description: Including pain rating scale, medication(s)/side effects and non-pharmacologic comfort measures Outcome: Progressing   Problem: Health Behavior/Discharge Planning: Goal: Ability to manage health-related needs will improve Outcome: Progressing   Problem: Clinical Measurements: Goal: Ability to maintain clinical measurements within normal limits will improve Outcome: Progressing Goal: Will remain free from infection Outcome: Progressing Goal: Diagnostic test results will improve Outcome: Progressing Goal: Respiratory complications will improve Outcome: Progressing Goal: Cardiovascular  complication will be avoided Outcome: Progressing   Problem: Activity: Goal: Risk for activity intolerance will decrease Outcome: Progressing   Problem: Nutrition: Goal: Adequate nutrition will be maintained Outcome: Progressing   Problem: Coping: Goal: Level of anxiety will decrease Outcome: Progressing   Problem: Elimination: Goal: Will not experience complications related to bowel motility Outcome: Progressing Goal: Will not experience complications related to urinary retention Outcome: Progressing   Problem: Pain Management: Goal: General experience of comfort will improve Outcome: Progressing   Problem: Safety: Goal: Ability to remain free from injury will improve Outcome: Progressing   Problem: Skin Integrity: Goal: Risk for impaired skin integrity will decrease Outcome: Progressing   Problem: Education: Goal: Knowledge of disease or condition will improve Outcome: Progressing Goal: Knowledge of secondary prevention will improve (MUST DOCUMENT ALL) Outcome: Progressing Goal: Knowledge of patient specific risk factors will improve Loraine Leriche N/A or DELETE if not current risk factor) Outcome: Progressing   Problem: Ischemic Stroke/TIA Tissue Perfusion: Goal: Complications of ischemic stroke/TIA will be minimized Outcome: Progressing   Problem: Coping: Goal: Will verbalize positive feelings about self Outcome: Progressing Goal: Will identify appropriate support needs Outcome: Progressing   Problem: Health Behavior/Discharge Planning: Goal: Ability to manage health-related needs will improve Outcome: Progressing Goal: Goals will be collaboratively established with patient/family Outcome: Progressing   Problem: Self-Care: Goal: Ability to participate in self-care as condition permits will improve Outcome: Progressing Goal: Verbalization of feelings and concerns over difficulty with self-care will improve Outcome: Progressing Goal: Ability to communicate needs  accurately will improve Outcome: Progressing   Problem: Nutrition: Goal: Risk of aspiration will decrease Outcome: Progressing Goal: Dietary intake will improve Outcome: Progressing   Problem: Education: Goal: Knowledge of disease or condition will improve Outcome: Progressing Goal: Knowledge of secondary prevention will improve (MUST DOCUMENT ALL) Outcome: Progressing Goal: Knowledge of patient specific risk factors will improve Loraine Leriche N/A or DELETE if not current risk factor) Outcome: Progressing   Problem: Ischemic Stroke/TIA Tissue Perfusion: Goal: Complications of ischemic stroke/TIA will be minimized Outcome: Progressing   Problem:  Coping: Goal: Will verbalize positive feelings about self Outcome: Progressing Goal: Will identify appropriate support needs Outcome: Progressing   Problem: Health Behavior/Discharge Planning: Goal: Ability to manage health-related needs will improve Outcome: Progressing Goal: Goals will be collaboratively established with patient/family Outcome: Progressing

## 2023-07-10 NOTE — Hospital Course (Signed)
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 Reported increased liquid stool from ostomy. He was subsequently intubated in the ED. Labs in ED notable for K 5.8, Cr 2.6, WBC 13, trop 83>705, BNP 63, UA large leuk, wbc21-50 few bacteria, UDS negative. Admitted to PCCM.  Transferred under [facility] 07/09/2023.  Details below.   Significant Hospital Events: Including procedures, antibiotic start and stop dates in addition to other pertinent events   1/9: admit from ED for AMS, intubated, CVC 1/10: MRI w/ 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 Stroke team/neuro consulted. Recommend stroke work up, d/c meningitis/encephalitis coverage, palliative consult.  1/10 unsuccessful attempts at lumbar puncture 1/12 significant ventilator dyssynchrony, given 1 dose of Nimbex, on Versed drip 1/13 agitated overnight despite fentanyl and versed pushes and Haldol 1/14 remains agitated with sedation wean  1/16 off versed gtt, responds to painful stimuli but not following commands 1/17 awakes more easily, moves all extremities and follows simple commands  1/18 extubated to [facility]  1/19 remained off precedex

## 2023-07-10 NOTE — Progress Notes (Signed)
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 Reported increased liquid stool from ostomy. He was subsequently intubated in the ED. Labs in ED notable for K 5.8, Cr 2.6, WBC 13, trop 83>705, BNP 63, UA large leuk, wbc21-50 few bacteria, UDS negative. Admitted to PCCM.  Transferred under [facility] 07/09/2023.  Details below.   Significant Hospital Events: Including procedures, antibiotic start and stop dates in addition to other pertinent events   1/9: admit from ED for AMS, intubated, CVC 1/10: MRI w/ 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 Stroke team/neuro consulted. Recommend stroke work up, d/c meningitis/encephalitis coverage, palliative consult.  1/10 unsuccessful attempts at lumbar puncture 1/12 significant ventilator dyssynchrony, given 1 dose of Nimbex, on Versed drip 1/13 agitated overnight despite fentanyl and versed pushes and Haldol 1/14 remains agitated with sedation wean  1/16 off versed gtt, responds to painful stimuli but not following commands 1/17 awakes more easily, moves all extremities and follows simple commands  1/18 extubated to [facility]  1/19 remained off precedex   Assessment and Plan: Septic shock 2/2 ESBL UTI and multifocal pneumonia / Rhinovirus infection positive - off pressors since 07/07/2023.  Blood pressure stable.  Completed 7 days of Merrem for UTI.  Trach aspirate growing Klebsiella and Serratia.  Also completed cefepime for 7 days for this as well. -stable at this time   Acute hypoxemic respiratory failure 2/2  multifocal pneumonia / COPD -Intubated in the ED and extubated 1/18.  Currently on room air.  Completed antibiotics as above. Rosalyn Gess, Pulmicort, Yupelri   Encephalopathy/Agitation/chronic pain: Patient fully alert and mostly oriented. - continue lyrica 125 mg TID -cont PTA on Seroquel 100 mg nightly  -oxycodone was recently reduced to 15 mg every 8 hours, PTA dose.   AKI on CKD IIIb -Baseline creatinine around 1.8.   -Cr stable at this time.   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  They recommended to continue aspirin alone for CVA prophylaxis and no Plavix due to history of GI bleed in the past.   Chronic heart failure with preserved ejection fraction: His EF was 50 to 55% in June and now it is 60%.  Heart failure with reduced ejection fraction as documented in previous note is ruled out.  His ejection fraction is normal.  He is on Lasix 40 mg PTA which has been resumed.  Appears euvolemic.   HTN/hyperlipidemia: Blood pressure controlled with fluctuations here and there. - continue coreg 12.5 mg bid  - hydralazine 10 mg q8h - lipitor 80 mg daily  - Cont IV labetalol 10 mg q2h PRN for SBP>180   T2DM: Appears to be taking Lantus 15 units PTA.  Also on Mounjaro.  Hemoglobin A1c 6.4.   -glycemic trends stable   Nutrition - cortrak/tube feeds stopped 07/08/2023 and started on dysphagia 1 diet, SLP following.   Depression/anxiety: Resume Lexapro.   BPH: Resume Flomax.   Generalized weakness: Seen by PT OT, SNF recommended.  TOC consulted.      Subjective: Eager to go  return to SNF  Physical Exam: Vitals:   07/09/23 2109 07/10/23 0421 07/10/23 0554 07/10/23 1336  BP: 132/71 133/78 133/78 113/65  Pulse: 65 67  69  Resp: 17 18    Temp: 97.8 F (36.6 C) 97.8 F (36.6 C)  97.7 F (36.5 C)  TempSrc: Oral   Oral  SpO2: 97% 100%  97%  Weight:   82.6 kg    Height:       General exam: Awake, laying in bed, in nad Respiratory system: Normal respiratory effort, no wheezing Cardiovascular system: regular rate, s1, s2 Gastrointestinal system: Soft, nondistended, positive BS Central nervous system: CN2-12 grossly intact, strength intact Extremities: Perfused, no clubbing Skin: Normal skin turgor, no notable skin lesions seen Psychiatry: Mood normal // no visual hallucinations   Data Reviewed:  Labs reviewed: Na 133, K 4.9, Cr 1.84  Family Communication: Pt in room, family not at bedside  Disposition: Status is: Inpatient Remains inpatient appropriate because: severity of illness  Planned Discharge Destination: Skilled nursing facility    Author: Rickey Barbara, MD 07/10/2023 2:46 PM  For on call review [email or web address].

## 2023-07-10 NOTE — Progress Notes (Signed)
Daily Progress Note   Patient Name: Gregory [name]       Date: 07/10/2023 DOB: [birth year]  Age: 63 y.o. MRN#: [id number] Attending Physician: Jerald Kief, MD Primary Care Physician: [name], Provider, MD Admit Date: 06/27/2023  Reason for Consultation/Follow-up: {Reason for Consult:23484}  Subjective: I have reviewed medical records including EPIC notes, MAR, any available advanced directives as necessary, and labs. Received report from primary RN - ***  All questions and concerns addressed. Encouraged to call with questions and/or concerns. PMT card provided.  Length of Stay: 13  Current Medications: Scheduled Meds:   arformoterol  15 mcg Nebulization BID   aspirin  81 mg Oral Daily   atorvastatin  80 mg Oral Daily   budesonide (PULMICORT) nebulizer solution  0.25 mg Nebulization BID   carvedilol  12.5 mg Oral BID WC   Chlorhexidine Gluconate Cloth  6 each Topical Q2000   escitalopram  20 mg Oral Daily   famotidine  20 mg Oral Daily   fluticasone  1 spray Each Nare Daily   furosemide  40 mg Per Tube Daily   heparin  5,000 Units Subcutaneous Q8H   hydrALAZINE  10 mg Oral Q8H   insulin aspart  0-15 Units Subcutaneous TID WC   melatonin  10 mg Oral QHS   nutrition supplement (JUVEN)  1 packet Oral BID BM   oxybutynin  5 mg Oral QHS   pregabalin  125 mg Oral TID   QUEtiapine  100 mg Oral QHS   revefenacin  175 mcg Nebulization Daily   senna  2 tablet Oral QHS   sodium bicarbonate  650 mg Oral BID   sodium chloride flush  10-40 mL Intracatheter Q12H   tamsulosin  0.8 mg Oral Daily    Continuous Infusions:   PRN Meds: acetaminophen, docusate, hydrALAZINE, labetalol, lip balm, loperamide, mouth rinse, oxyCODONE, oxymetazoline, polyethylene glycol  Physical Exam           Vital Signs: BP 133/78   Pulse 67   Temp 97.8 F (36.6 C)   Resp 18   Ht 6' 0.01" (1.829 m)   Wt 82.6 kg   SpO2 100%   BMI 24.69 kg/m  SpO2: SpO2: 100 % O2 Device: O2 Device: Nasal Cannula O2 Flow Rate: O2 Flow Rate (L/min): 2 L/min  Intake/output summary:  Intake/Output  Summary (Last 24 hours) at 07/10/2023 1241 Last data filed at 07/10/2023 0500 Gross per 24 hour  Intake --  Output 1800 ml  Net -1800 ml   LBM: Last BM Date : 07/09/23 Baseline Weight: Weight: 84 kg Most recent weight: Weight: 82.6 kg       Palliative Assessment/Data:      Patient Active Problem List   Diagnosis Date Noted   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) 07/03/2023   Fever 06/28/2023   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   Altered mental status 02/08/2023   Abscess of abdominal cavity (HCC) 02/08/2023   Protein-calorie malnutrition, severe 12/19/2022   Occipital stroke (HCC) 11/27/2022   Skin ulcer of left ankle, limited to breakdown of skin (HCC) 09/19/2022   Stroke-like symptom 09/18/2022   Diabetic ulcer of lower leg (HCC) 09/18/2022   Constipation 09/18/2022   Shortness of breath 09/07/2022   Symptomatic anemia 09/05/2022   Chronic hypoxic respiratory failure (HCC) 09/05/2022   Respiratory failure (HCC) 08/22/2022   Pressure injury of skin 08/22/2022   Influenza A 08/22/2022   Sepsis (HCC) 08/22/2022   Pneumonia 05/01/2022   Right upper lobe pneumonia 09/05/2021   Hyponatremia 09/05/2021   Anxiety 09/05/2021   BPH (benign prostatic hyperplasia) 09/05/2021   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   Asymptomatic bacteriuria 06/30/2021   Normocytic anemia 06/29/2021   Pain of amputation stump of right lower extremity (HCC) 03/15/2021   Melanoma of cheek (HCC) 10/15/2019   Acute hypoxic respiratory failure (HCC) 07/11/2019   Acute respiratory disease due to COVID-19 virus 07/11/2019   Wound infection 06/22/2019   Abscess 06/22/2019    Chronic kidney disease, stage 3b (HCC) 05/23/2019   Tobacco abuse 05/23/2019   Cellulitis 05/23/2019   S/P AKA (above knee amputation) unilateral, right (HCC) 12/05/2018   Amputation stump infection (HCC)    Lymphedema of left leg    Diabetic polyneuropathy associated with type 2 diabetes mellitus (HCC)    Above knee amputation of right lower extremity (HCC) 10/22/2018   Dehiscence of amputation stump (HCC)    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   Candidiasis 08/12/2018   Necrosis of toe (HCC) 07/08/2018   Amputated toe of left foot (HCC) 06/18/2018   Streptococcal infection    Pressure injury, stage 3 (HCC) 06/06/2018   Cutaneous abscess of left foot    Moderate protein-calorie malnutrition (HCC)    Cellulitis of left lower extremity    Septic arthritis (HCC) 06/03/2018   Idiopathic chronic venous hypertension of left lower extremity with ulcer and inflammation (HCC) 11/14/2017   Great toe amputation status, left 11/14/2017   Enlarged lymph node in neck 07/18/2017   Malignant melanoma of face (HCC) 04/16/2017   Osteomyelitis (HCC) 03/25/2017   Skin cancer 03/25/2017   Depression 11/05/2016   Diabetic ulcer of toe of left foot (HCC) 10/02/2016   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   Chronic pain 09/27/2016   DM2 (diabetes mellitus, type 2) (HCC) 06/06/2016   GERD without esophagitis 05/11/2016   Hypertensive heart disease with congestive heart failure (HCC) 12/15/2015   Spinal stenosis in cervical region 12/15/2015   Type II diabetes mellitus with neurological manifestations (HCC) 12/15/2015   Chronic diastolic HF (heart failure) (HCC)    Pulmonary hypertension (HCC)    HTN (hypertension)    HLD (hyperlipidemia)    Urinary retention    Diskitis    Foot drop, bilateral    Sepsis with acute renal failure without septic shock (HCC)  COPD (chronic obstructive pulmonary disease) (HCC) 10/03/2015   Acute osteomyelitis of left foot (HCC) 10/03/2015   MRSA  bacteremia 08/13/2015   Chronic low back pain 08/13/2015   Chronic multifocal osteomyelitis, multiple sites (HCC) 08/13/2015   Community acquired pneumonia 08/13/2015   Encephalopathy 03/25/2012    Palliative Care Assessment & Plan   Patient Profile: ***  Assessment: Principal Problem:   Respiratory failure (HCC) Active Problems:   Encephalopathy   Fever   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)   Recommendations/Plan: ***  Goals of Care and Additional Recommendations: Limitations on Scope of Treatment: {Recommended Scope and Preferences:21019}  Code Status:    Code Status Orders  (From admission, onward)           Start     Ordered   06/27/23 0817  Full code  Continuous       Question:  By:  Answer:  Consent: discussion documented in EHR   06/27/23 0820           Code Status History     Date Active Date Inactive Code Status Order ID Comments User Context   02/08/2023 1912 02/10/2023 0252 Full Code [id number]  Lorayne Bender, MD ED   12/03/2022 2336 12/25/2022 2021 Full Code [id number]  Kathlene Cote, [name] ED   09/18/2022 2004 09/22/2022 0244 Full Code [id number]  Therisa Doyne, MD ED   09/05/2022 2113 09/07/2022 1842 Full Code [id number]  Anselm Jungling, DO ED   08/22/2022 1509 08/27/2022 2217 Full Code [id number]  Emeline General, MD ED   08/22/2022 1305 08/22/2022 1509 Full Code [id number]  Gleason, Darcella Gasman, [name] ED   08/22/2022 1122 08/22/2022 1305 Full Code [id number]  Rondel Baton, MD ED   05/01/2022 1544 05/04/2022 2119 Full Code [id number]  Noel Christmas, MD Inpatient   01/24/2022 1734 01/31/2022 2115 Full Code [id number]  Janeann Forehand D, NP ED   09/05/2021 0804 09/08/2021 2028 Full Code [id number]  Margie Ege A, DO ED   06/26/2021 0351 07/07/2021 2033 Full Code [id number]  Eliezer Champagne, NP ED   07/11/2019 1312 07/18/2019 1807 Full Code [id number]  Levie Heritage, DO ED   06/22/2019 1730 06/27/2019 1816 Full Code [id number]  Emeline General, MD ED   05/23/2019 0543 05/31/2019 1946 Full Code [id number]  John Giovanni, MD ED   12/05/2018 1756 12/10/2018 0057 Full Code [id number]  Darrol Poke, [name] Inpatient   11/16/2018 1809 11/22/2018 1904 Full Code [id number]  Marthenia Rolling, DO ED   10/22/2018 1546 10/24/2018 1610 Full Code [id number]  Nadara Mustard, MD Inpatient   06/03/2018 2131 06/09/2018 1546 Full Code [id number]  Osvaldo Shipper, MD Inpatient   03/25/2017 1724 04/02/2017 1951 Full Code [id number]  Joseph Art, DO ED   11/07/2015 0631 12/02/2015 1655 Full Code [id number]  Coralyn Helling, MD ED       Prognosis:  {Palliative Care Prognosis:23504}  Discharge Planning: {Palliative dispostion:23505}  Care plan was discussed with ***  Thank you for allowing the Palliative Medicine Team to assist in the care of this patient.   Time In: *** Time Out: *** Total Time *** Prolonged Time Billed  {YES JK:93267}       Haskel Khan, NP  Please contact Palliative Medicine Team phone at [phone] for questions and concerns.   *Portions of this note are a verbal dictation therefore any spelling and/or grammatical errors are due to the "[employer]" system interpretation.

## 2023-07-11 ENCOUNTER — Other Ambulatory Visit (HOSPITAL_COMMUNITY): Payer: Self-pay

## 2023-07-11 LAB — GLUCOSE, CAPILLARY: Glucose-Capillary: 148 mg/dL — ABNORMAL HIGH (ref 70–99)

## 2023-07-11 MED ORDER — FAMOTIDINE 20 MG PO TABS
20.0000 mg | ORAL_TABLET | Freq: Every day | ORAL | 0 refills | Status: DC
  Filled 2023-07-11: qty 30, 30d supply, fill #0

## 2023-07-11 MED ORDER — OXYCODONE HCL ER 15 MG PO T12A: 15.0000 mg | EXTENDED_RELEASE_TABLET | Freq: Three times a day (TID) | ORAL | 0 refills | Status: DC

## 2023-07-11 MED ORDER — OXYCODONE HCL 5 MG PO TABS: 5.0000 mg | ORAL_TABLET | Freq: Four times a day (QID) | ORAL | 0 refills | Status: DC | PRN

## 2023-07-11 MED ORDER — DOCUSATE SODIUM 50 MG/5ML PO LIQD: 100.0000 mg | Freq: Two times a day (BID) | ORAL | Status: DC | PRN

## 2023-07-11 NOTE — TOC Transition Note (Signed)
Transition of Care [facility]) - Discharge Note   Patient Details  Name: Gregory [name] MRN: [id number] Date of Birth: March 16, 1961  Transition of Care [facility]) CM/SW Contact:  Geral Coker A [place], Theresia Majors Phone Number: 07/11/2023, 4:20 PM   Clinical Narrative:     Patient will DC to: [name]  Anticipated DC date: 07/11/23  Family notified: Francesca Oman  Transport by: Sharin Mons      Per MD patient ready for DC to [facility]. RN, patient, patient's family, and facility notified of DC. Discharge Summary and FL2 sent to facility. RN to call report prior to discharge (Room 116, [phone]). DC packet on chart. Ambulance transport requested for patient.     CSW will sign off for now as social work intervention is no longer needed. Please consult [place] again if new needs arise.   Final next level of care: Skilled Nursing Facility Barriers to Discharge: Barriers Resolved   Patient Goals and CMS Choice            Discharge Placement              Patient chooses bed at:  [facility]) Patient to be transferred to facility by: [facility] Name of family member notified: Francesca Oman Patient and family notified of of transfer: 07/11/23  Discharge Plan and Services Additional resources added to the After Visit Summary for       Post Acute Care Choice: Skilled Nursing Facility                               Social Drivers of Health (SDOH) Interventions SDOH Screenings   Food Insecurity: No Food Insecurity (06/30/2023)  Housing: Low Risk  (06/30/2023)  Transportation Needs: No Transportation Needs (06/30/2023)  Utilities: Not At Risk (06/30/2023)  Depression (PHQ2-9): Low Risk  (03/08/2021)  Tobacco Use: High Risk (06/30/2023)     Readmission Risk Interventions    09/06/2022    1:48 PM 08/27/2022   12:46 PM 05/03/2022   11:53 AM  Readmission Risk Prevention Plan  Transportation Screening Complete Complete Complete  Medication Review Oceanographer) Complete Complete  Complete  PCP or Specialist appointment within 3-5 days of discharge Complete Complete Complete  HRI or Home Care Consult Complete Complete Complete  SW Recovery Care/Counseling Consult Complete Complete Complete  Palliative Care Screening Not Applicable Not Applicable Not Applicable  Skilled Nursing Facility Complete Complete Complete

## 2023-07-11 NOTE — Discharge Summary (Signed)
Physician Discharge Summary   Patient: Gregory [name] MRN: [id number] DOB: [birth year]  Admit date:     06/27/2023  Discharge date: 07/11/23  Discharge Physician: Rickey Barbara   PCP: [name], Provider, MD   Recommendations at discharge:    Follow up with PCP in 1-2 weeks  Discharge Diagnoses: Principal Problem:   Respiratory failure (HCC) Active Problems:   Encephalopathy   Fever   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)  Resolved Problems:   * No resolved hospital problems. 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 Reported increased liquid stool from ostomy. He was subsequently intubated in the ED. Labs in ED notable for K 5.8, Cr 2.6, WBC 13, trop 83>705, BNP 63, UA large leuk, wbc21-50 few bacteria, UDS negative. Admitted to PCCM.  Transferred under [facility] 07/09/2023.  Details below.   Significant Hospital Events: Including procedures, antibiotic start and stop dates in addition to other pertinent events   1/9: admit from ED for AMS, intubated, CVC 1/10: MRI w/ 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 Stroke team/neuro consulted. Recommend stroke work up, d/c meningitis/encephalitis coverage, palliative consult.  1/10 unsuccessful attempts at lumbar puncture 1/12 significant ventilator dyssynchrony, given 1 dose of Nimbex, on Versed drip 1/13 agitated overnight despite fentanyl and versed pushes and Haldol 1/14 remains agitated with sedation wean  1/16 off versed gtt, responds to painful stimuli but not following commands 1/17 awakes more easily, moves all extremities and follows simple commands  1/18 extubated to [facility]  1/19 remained off  precedex   Assessment and Plan: Septic shock 2/2 ESBL UTI and multifocal pneumonia / Rhinovirus infection positive - off pressors since 07/07/2023.  Blood pressure stable.  Completed 7 days of Merrem for UTI.  Trach aspirate growing Klebsiella and Serratia.  Also completed cefepime for 7 days for this as well. -stable at this time   Acute hypoxemic respiratory failure 2/2 multifocal pneumonia / COPD -Intubated in the ED and extubated 1/18.  Currently on room air.  Completed antibiotics as above. Rosalyn Gess, Pulmicort, Yupelri   Encephalopathy/Agitation/chronic pain: Patient fully alert and mostly oriented. - continue lyrica 125 mg TID -cont PTA on Seroquel 100 mg nightly  -resume home pain meds on d/c   AKI on CKD IIIb -Baseline creatinine around 1.8.   -Cr stable at this time.   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  They recommended to continue aspirin alone for CVA prophylaxis and no Plavix due to history of GI bleed in the past.   Chronic heart failure with preserved ejection fraction: His EF was 50 to 55% in June and now it is 60%.  Heart failure with reduced ejection fraction as documented in previous note is ruled out.  His ejection fraction is normal.  He was on Lasix 40 mg PTA which has been resumed.  Appears euvolemic.   HTN/hyperlipidemia: Blood pressure controlled with fluctuations here and there. - continue coreg 12.5 mg bid  - hydralazine 10 mg q8h - lipitor 80 mg daily    T2DM: Appears to be taking Lantus 15 units PTA.  Also on Mounjaro.  Hemoglobin A1c 6.4.   -glycemic trends stable -  cont home meds on d/c   Nutrition - cortrak/tube feeds stopped 07/08/2023 and started on dysphagia 1 diet, SLP following.   Depression/anxiety: Resume Lexapro.   BPH: Resume Flomax.   Generalized weakness: Seen by PT OT, SNF recommended.        Consultants: PCCM Procedures performed:   Disposition: Skilled nursing facility Diet recommendation:  Carb modified diet DISCHARGE MEDICATION: Allergies as of 07/11/2023       Reactions   Latex Other (See Comments)   "ALLERGIC," per [facility]        Medication List     STOP taking these medications    cyclobenzaprine 5 MG tablet Commonly known as: FLEXERIL   Narcan 4 MG/0.1ML Liqd nasal spray kit Generic drug: naloxone       TAKE these medications    acetaminophen 500 MG tablet Commonly known as: TYLENOL Take 1,000 mg by mouth in the morning and at bedtime.   aspirin EC 81 MG tablet Take 81 mg by mouth daily. Swallow whole.   atorvastatin 80 MG tablet Commonly known as: LIPITOR Take 1 tablet (80 mg total) by mouth daily. What changed: when to take this   carvedilol 12.5 MG tablet Commonly known as: COREG Take 1 tablet (12.5 mg total) by mouth 2 (two) times daily with a meal.   Cholecalciferol 25 MCG (1000 UT) capsule Take 2,000 Units by mouth daily.   Cranberry 450 MG Caps Take 450 mg by mouth daily.   docusate 50 MG/5ML liquid Commonly known as: COLACE Take 10 mLs (100 mg total) by mouth 2 (two) times daily as needed for mild constipation.   escitalopram 20 MG tablet Commonly known as: LEXAPRO Take 20 mg by mouth daily.   famotidine 20 MG tablet Commonly known as: PEPCID Take 1 tablet (20 mg total) by mouth daily. Start taking on: July 12, 2023   ferrous sulfate 325 (65 FE) MG tablet Take 1 tablet (325 mg total) by mouth 3 (three) times daily with meals. What changed:  when to take this additional instructions   fluticasone 50 MCG/ACT nasal spray Commonly known as: FLONASE Place 1 spray into both nostrils daily.   furosemide 20 MG tablet Commonly known as: LASIX Take 20-40 mg by mouth See admin instructions. Take 40 mg by mouth in the morning and 20 mg by mouth in the afternoon   haloperidol lactate 5 MG/ML injection Commonly known  as: HALDOL Inject 1 mg into the muscle every 6 (six) hours as needed (agitation/psychosis).   hydrALAZINE 10 MG tablet Commonly known as: APRESOLINE Take 1 tablet (10 mg total) by mouth every 8 (eight) hours.   hydrOXYzine 50 MG capsule Commonly known as: VISTARIL Take 50 mg by mouth at bedtime.   insulin lispro 100 UNIT/ML KwikPen Commonly known as: HUMALOG Inject 0-10 Units into the skin See admin instructions. Inject 0-10 units into the skin three times a day and at bedtime, PER SLIDING SCALE:  BGL 151-200 = 2 units 201-250 = 4 units 251-300 = 6 units 301-350 =  8 units 351-400 = 10 units What changed: additional instructions   ipratropium-albuterol 0.5-2.5 (3) MG/3ML Soln Commonly known as: DUONEB Take 3 mLs by nebulization every 6 (six) hours as needed (for shortness of breath).   LACTOBACILLUS PO Take 1 capsule by mouth in the morning and at bedtime.   Lantus SoloStar 100 UNIT/ML Solostar Pen Generic drug: insulin glargine Inject 15 Units into the skin daily. What changed: when to take this   lidocaine 5 %  Commonly known as: LIDODERM Place 2 patches onto the skin daily. Remove & Discard patch within 12 hours or as directed by MD What changed:  how much to take when to take this additional instructions   loperamide 2 MG capsule Commonly known as: IMODIUM Take 1 capsule (2 mg total) by mouth 2 (two) times daily.   loratadine 10 MG tablet Commonly known as: CLARITIN Take 10 mg by mouth in the morning.   magnesium oxide 400 (240 Mg) MG tablet Commonly known as: MAG-OX Take 400 mg by mouth in the morning, at noon, and at bedtime.   Melatonin 10 MG Tabs Take 10 mg by mouth at bedtime.   Mounjaro 2.5 MG/0.5ML Pen Generic drug: tirzepatide Inject 2.5 mg into the skin once a week. On Tuesdays   multivitamin with minerals Tabs tablet Take 1 tablet by mouth daily.   Natural Balance Tears 0.1-0.3 % Soln Generic drug: Dextran 70-Hypromellose Place 1 drop into  both eyes every 4 (four) hours as needed (dry eyes).   omeprazole 20 MG capsule Commonly known as: PRILOSEC Take 20 mg by mouth at bedtime.   ondansetron 4 MG tablet Commonly known as: ZOFRAN Take 4 mg by mouth every 8 (eight) hours as needed for nausea or vomiting.   oxybutynin 5 MG 24 hr tablet Commonly known as: DITROPAN-XL Take 5 mg by mouth in the morning and at bedtime.   oxyCODONE 5 MG immediate release tablet Commonly known as: Oxy IR/ROXICODONE Take 1 tablet (5 mg total) by mouth every 6 (six) hours as needed for moderate pain (pain score 4-6).   oxyCODONE 15 mg 12 hr tablet Commonly known as: OXYCONTIN Take 1 tablet (15 mg total) by mouth in the morning, at noon, and at bedtime.   OXYGEN Inhale 2 L into the lungs as needed (to maintain sats above 90/SOB).   oxymetazoline 0.05 % nasal spray Commonly known as: AFRIN Place 1 spray into both nostrils 2 (two) times daily as needed for congestion.   pregabalin 25 MG capsule Commonly known as: LYRICA Take 25 mg by mouth 3 (three) times daily. What changed: Another medication with the same name was changed. Make sure you understand how and when to take each.   pregabalin 100 MG capsule Commonly known as: LYRICA Take 1 capsule (100 mg total) by mouth 2 (two) times daily. What changed: when to take this   QUEtiapine 100 MG tablet Commonly known as: SEROQUEL Take 100 mg by mouth at bedtime.   senna 8.6 MG Tabs tablet Commonly known as: SENOKOT Take 2 tablets by mouth at bedtime.   sodium bicarbonate 650 MG tablet Take 650 mg by mouth 2 (two) times daily.   tamsulosin 0.4 MG Caps capsule Commonly known as: FLOMAX Take 2 capsules (0.8 mg total) by mouth daily. What changed: how much to take   trolamine salicylate 10 % cream Commonly known as: ASPERCREME Apply 1 Application topically in the morning, at noon, in the evening, and at bedtime. Right leg   UNABLE TO FIND Take 30 mLs by mouth in the morning and at  bedtime. Med Name: liquid protein   Uro-MP 118 MG Caps Take 118 mg by mouth 2 (two) times daily.        Follow-up Information     Follow up with PCP in 1-2 weeks Follow up.   Why: Hospital follow up               Discharge Exam: Filed Weights   07/08/23 0500  07/10/23 0421 07/11/23 0555  Weight: 80.9 kg 82.6 kg 86.4 kg   General exam: Awake, laying in bed, in nad Respiratory system: Normal respiratory effort, no wheezing Cardiovascular system: regular rate, s1, s2 Gastrointestinal system: Soft, nondistended, positive BS Central nervous system: CN2-12 grossly intact, strength intact Extremities: Perfused, no clubbing Skin: Normal skin turgor, no notable skin lesions seen Psychiatry: Mood normal // no visual hallucinations   Condition at discharge: fair  The results of significant diagnostics from this hospitalization (including imaging, microbiology, ancillary and laboratory) are listed below for reference.   Imaging Studies: DG Swallowing Func-Speech Pathology Result Date: 07/08/2023 Table formatting from the original result was not included. Modified Barium Swallow Study Patient Details Name: ELISANDRO WYKA MRN: [id number] Date of Birth: 03-28-1961 Today's Date: 07/08/2023 HPI/PMH: HPI: Gregory [name] is a 63 year old male who presented to ED from SNF for altered mental status/obtunded.  He was subsequently intubated in the ED.  Required ETT 1/9-1/18. 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 Clinical Impression: Clinical Impression: Patient is presenting with a primary oral dysphagia as per this modified barium swallow study. No penetration or aspiration observed with any of the tested barium consistencies. (Boluses of thin, nectar thick and honey thick liquids, puree solids and 13mm barium tablet  were all administered.) Patient did exhibit delayed anterior to posterior transit in oral cavity of all PO's leading to oral residuals on base of tongue, palate as well as pharyngeal residuals in vallecular sinus. A second "dry swallow" was effective to clear majority of residuals. PES opening and barium transit both WFL and no retrograde movement of barium observed. SLP recommending to initiate PO diet of Dys 1 (puree) solids, thin liquids and will follow for ability to advance. Factors that may increase risk of adverse event in presence of aspiration Rubye Oaks & Clearance Coots 2021): Factors that may increase risk of adverse event in presence of aspiration Rubye Oaks & Clearance Coots 2021): Dependence for feeding and/or oral hygiene; Reduced cognitive function Recommendations/Plan: Swallowing Evaluation Recommendations Swallowing Evaluation Recommendations Recommendations: PO diet PO Diet Recommendation: Dysphagia 1 (Pureed); Thin liquids (Level 0) Liquid Administration via: Cup; Straw Medication Administration: Whole meds with puree Supervision: Staff to assist with self-feeding; Full assist for feeding; Full supervision/cueing for swallowing strategies Swallowing strategies  : Slow rate; Small bites/sips Postural changes: Position pt fully upright for meals Oral care recommendations: Oral care BID (2x/day) Treatment Plan Treatment Plan Treatment recommendations: Therapy as outlined in treatment plan below Follow-up recommendations: Other (comment) (TBD (SNF SLP vs no SLP)) Functional status assessment: Patient has had a recent decline in their functional status and demonstrates the ability to make significant improvements in function in a reasonable and predictable amount of time. Treatment frequency: Min 2x/week Treatment duration: 1 week Interventions: Trials of upgraded texture/liquids; Aspiration precaution training; Diet toleration management by SLP; Patient/family education Recommendations Recommendations for follow up  therapy are one component of a multi-disciplinary discharge planning process, led by the attending physician.  Recommendations may be updated based on patient status, additional functional criteria and insurance authorization. Assessment: Orofacial Exam: Orofacial Exam Oral Cavity: Oral Hygiene: WFL Oral Cavity - Dentition: Edentulous Orofacial Anatomy: WFL Oral Motor/Sensory Function: WFL Anatomy: Anatomy: Presence of cervical hardware Boluses Administered: Boluses Administered Boluses Administered: Thin liquids (Level 0); Mildly thick liquids (Level 2, nectar thick); Moderately thick liquids (Level 3, honey thick); Puree  Oral Impairment Domain: Oral Impairment Domain  Lip Closure: No labial escape Tongue control during bolus hold: Not tested Bolus transport/lingual motion: Slow tongue motion Oral residue: Residue collection on oral structures Location of oral residue : Tongue; Palate Initiation of pharyngeal swallow : Valleculae  Pharyngeal Impairment Domain: Pharyngeal Impairment Domain Soft palate elevation: No bolus between soft palate (SP)/pharyngeal wall (PW) Laryngeal elevation: Partial superior movement of thyroid cartilage/partial approximation of arytenoids to epiglottic petiole Anterior hyoid excursion: Partial anterior movement Epiglottic movement: Complete inversion Laryngeal vestibule closure: Complete, no air/contrast in laryngeal vestibule Pharyngeal stripping wave : Present - complete Pharyngeal contraction (A/P view only): N/A Pharyngoesophageal segment opening: Complete distension and complete duration, no obstruction of flow Tongue base retraction: Trace column of contrast or air between tongue base and PPW Pharyngeal residue: Trace residue within or on pharyngeal structures Location of pharyngeal residue: Tongue base; Valleculae  Esophageal Impairment Domain: Esophageal Impairment Domain Esophageal clearance upright position: Complete clearance, esophageal coating Pill: Pill Consistency  administered: Thin liquids (Level 0) Thin liquids (Level 0): Impaired (see clinical impressions) Penetration/Aspiration Scale Score: Penetration/Aspiration Scale Score 1.  Material does not enter airway: Thin liquids (Level 0); Mildly thick liquids (Level 2, nectar thick); Moderately thick liquids (Level 3, honey thick); Puree Compensatory Strategies: Compensatory Strategies Compensatory strategies: Yes Straw: Effective Effective Straw: Thin liquid (Level 0)   General Information: No data recorded Diet Prior to this Study: NPO; Cortrak/Small bore NG tube   Temperature : Normal   Respiratory Status: WFL   Supplemental O2: Nasal cannula   History of Recent Intubation: Yes  Behavior/Cognition: Alert; Requires cueing Self-Feeding Abilities: Needs assist with self-feeding Baseline vocal quality/speech: Hypophonia/low volume; Dysphonic Volitional Cough: Able to elicit Volitional Swallow: Able to elicit Exam Limitations: Other (comment) (mild decreased view due to patient unable to to achieve fully upright position likely secondary to h/o cervical fusion) Goal Planning: Prognosis for improved oropharyngeal function: Good No data recorded No data recorded Patient/Family Stated Goal: requesting water Consulted and agree with results and recommendations: Patient Pain: Pain Assessment Pain Assessment: No/denies pain Facial Expression: 0 Body Movements: 0 Muscle Tension: 0 Compliance with ventilator (intubated pts.): N/A Vocalization (extubated pts.): 0 CPOT Total: 0 End of Session: Start Time:SLP Start Time (ACUTE ONLY): 1305 Stop Time: SLP Stop Time (ACUTE ONLY): 1320 Time Calculation:SLP Time Calculation (min) (ACUTE ONLY): 15 min Charges: SLP Evaluations $ SLP Speech Visit: 1 Visit SLP Evaluations $BSS Swallow: 1 Procedure $MBS Swallow: 1 Procedure SLP visit diagnosis: SLP Visit Diagnosis: Dysphagia, oropharyngeal phase (R13.12) Past Medical History: Past Medical History: Diagnosis Date  Acquired absence of left great toe  (HCC)   Acquired absence of right leg below knee (HCC)   Acute osteomyelitis of calcaneum, right (HCC) 10/02/2016  Acute respiratory failure (HCC)   Amputation stump infection (HCC) 08/12/2018  Anemia   Basal cell carcinoma, eyelid   Cancer (HCC)   Candidiasis 08/12/2018  CHF (congestive heart failure) (HCC)   chronic diastolic   Chronic back pain   Chronic kidney disease   stage 3   Chronic multifocal osteomyelitis (HCC)   of ankle and foot   Chronic pain syndrome   COPD (chronic obstructive pulmonary disease) (HCC)   DDD (degenerative disc disease), lumbar   Depression   major depressive disorder   Diabetes mellitus without complication (HCC)   type 2   Diabetic ulcer of toe of left foot (HCC) 10/02/2016  Difficult intubation   Difficulty in walking, not elsewhere classified   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  Foot drop, bilateral   Foot drop, right  12/27/2015  GERD (gastroesophageal reflux disease)   Herpesviral vesicular dermatitis   History of COVID-19 03/15/2022  Hyperlipidemia   Hyperlipidemia   Hypertension   Insomnia   Malignant melanoma of other parts of face (HCC)   Melanoma (HCC)   MRSA bacteremia   Muscle weakness (generalized)   Nasal congestion   Necrosis of toe (HCC) 07/08/2018  Neuromuscular dysfunction of bladder   Osteomyelitis (HCC)   Osteomyelitis (HCC)   Osteomyelitis (HCC)   right BKA  Other idiopathic peripheral autonomic neuropathy   Retention of urine, unspecified   Squamous cell carcinoma of skin   Stroke (HCC)   Unsteadiness on feet   Urinary retention   Urinary retention   Urinary tract infection   Wears dentures   Wears glasses  Past Surgical History: Past Surgical History: Procedure Laterality Date  AMPUTATION Left 03/29/2017  Procedure: LEFT GREAT TOE AMPUTATION AT METATARSOPHALANGEAL JOINT;  Surgeon: Nadara Mustard, MD;  Location: [facility] OR;  Service: Orthopedics;  Laterality: Left;  AMPUTATION Right 03/29/2017  Procedure: RIGHT BELOW KNEE AMPUTATION;  Surgeon: Nadara Mustard, MD;  Location:  [facility] OR;  Service: Orthopedics;  Laterality: Right;  AMPUTATION Left 06/06/2018  Procedure: LEFT 2ND TOE AMPUTATION;  Surgeon: Nadara Mustard, MD;  Location: [facility] OR;  Service: Orthopedics;  Laterality: Left;  AMPUTATION Right 11/19/2018  Procedure: AMPUTATION ABOVE KNEE;  Surgeon: Nadara Mustard, MD;  Location: [facility] OR;  Service: Orthopedics;  Laterality: Right;  ANTERIOR CERVICAL CORPECTOMY N/A 11/25/2015  Procedure: ANTERIOR CERVICAL FIVE CORPECTOMY Cervical four - six fusion;  Surgeon: Lisbeth Renshaw, MD;  Location: [facility];  Service: Neurosurgery;  Laterality: N/A;  ANTERIOR CERVICAL FIVE CORPECTOMY Cervical four - six fusion  APPLICATION OF WOUND VAC Right 10/22/2018  Procedure: Application Of Wound Vac;  Surgeon: Nadara Mustard, MD;  Location: [facility] OR;  Service: Orthopedics;  Laterality: Right;  CYSTOSCOPY WITH BIOPSY N/A 05/01/2022  Procedure: CYSTOSCOPY WITH URETHRAL BIOPSY;  Surgeon: Noel Christmas, MD;  Location: [facility] ORS;  Service: Urology;  Laterality: N/A;  1 HR  CYSTOSCOPY WITH RETROGRADE URETHROGRAM N/A 04/25/2018  Procedure: CYSTOSCOPY WITH RETROGRADE URETHROGRAM/ BALLOON DILATION;  Surgeon: Ihor Gully, MD;  Location: [facility] ORS;  Service: Urology;  Laterality: N/A;  CYSTOSCOPY WITH URETHRAL DILATATION N/A 05/01/2022  Procedure: BALLOON DILATION WITH  OPTILUME;  Surgeon: Noel Christmas, MD;  Location: [facility] ORS;  Service: Urology;  Laterality: N/A;  LAPAROTOMY N/A 12/03/2022  Procedure: EXPLORATORY LAPAROTOMY  total abdominal colectomy with end ileostomy;  Surgeon: Kinsinger, De Blanch, MD;  Location: [facility] OR;  Service: General;  Laterality: N/A;  MULTIPLE TOOTH EXTRACTIONS    POSTERIOR CERVICAL FUSION/FORAMINOTOMY N/A 11/29/2015  Procedure: Cervical Three-Cervical Seven Posterior Cervical Laminectomy with Fusion;  Surgeon: Lisbeth Renshaw, MD;  Location: [facility];  Service: Neurosurgery;  Laterality: N/A;  Cervical Three-Cervical Seven Posterior Cervical Laminectomy with Fusion  STUMP REVISION Right  10/22/2018  Procedure: REVISION RIGHT BELOW KNEE AMPUTATION;  Surgeon: Nadara Mustard, MD;  Location: [facility] OR;  Service: Orthopedics;  Laterality: Right;  STUMP REVISION Right 12/05/2018  Procedure: Revision Right Above Knee Amputation;  Surgeon: Nadara Mustard, MD;  Location: [facility] OR;  Service: Orthopedics;  Laterality: Right;  STUMP REVISION Right 05/29/2019  Procedure: REVISION RIGHT ABOVE KNEE AMPUTATION;  Surgeon: Nadara Mustard, MD;  Location: [facility] OR;  Service: Orthopedics;  Laterality: Right;  STUMP REVISION Right 06/24/2019  Procedure: STUMP REVISION;  Surgeon: Nadara Mustard, MD;  Location: [facility] OR;  Service: Orthopedics;  Laterality: Right;  Angela Nevin, MA, CCC-SLP Speech Therapy   DG CHEST PORT 1 VIEW Result Date: 07/06/2023 CLINICAL DATA:  64 year old male with history of pneumonia. Shortness of breath. EXAM: PORTABLE CHEST 1 VIEW COMPARISON:  Chest x-ray 06/30/2023. FINDINGS: A feeding tube is seen extending into the abdomen, however, the tip of the feeding tube extends below the lower margin of the image. Lung volumes are normal. Patchy areas of interstitial prominence an ill-defined airspace consolidation are noted, most evident in the left lung base and throughout the right mid to lower lung, particularly in a perihilar distribution. No definite right pleural effusion. Left costophrenic sulcus is excluded from the margin of the image. With this limitation in mind, no definite left pleural effusion is noted. No pneumothorax. Pulmonary vasculature does not appear engorged. Heart size is normal. The patient is rotated to the left on today's exam, resulting in distortion of the mediastinal contours and reduced diagnostic sensitivity and specificity for mediastinal pathology. Orthopedic fixation hardware in the lower cervical spine incidentally noted. IMPRESSION: 1. Support apparatus, as above. 2. The appearance of the chest is concerning for multilobar bilateral pneumonia, as above. Electronically Signed    By: Trudie Reed M.D.   On: 07/06/2023 08:00   DG Abd Portable 1V Result Date: 07/05/2023 CLINICAL DATA:  Feeding tube placement EXAM: PORTABLE ABDOMEN - 1 VIEW COMPARISON:  06/30/2023 FINDINGS: NG tube replaced by feeding tube. Feeding tube in the gastric antrum. Tip pointed towards the pylorus. IMPRESSION: Feeding tube in distal stomach. Electronically Signed   By: Genevive Bi M.D.   On: 07/05/2023 14:11   VAS [place] TRANSCRANIAL DOPPLER Result Date: 07/02/2023  Transcranial Doppler Patient Name:  Gregory [name]  Date of Exam:   07/01/2023 Medical Rec #: [id number]     Accession #:    [id number] Date of Birth: 05-09-1961    Patient Gender: M Patient Age:   12 years Exam Location:  [facility] Procedure:      VAS [place] TRANSCRANIAL DOPPLER Referring Phys: Elmer Picker --------------------------------------------------------------------------------  Indications: Stroke. History: PAD, right AKA, HTN, HLD, DM, CHF. Limitations: Ventilation, trach collar, body habitus Comparison Study: No prior study Performing Technologist: Sherren Kerns RVS  Examination Guidelines: A complete evaluation includes B-mode imaging, spectral Doppler, color Doppler, and power Doppler as needed of all accessible portions of each vessel. Bilateral testing is considered an integral part of a complete examination. Limited examinations for reoccurring indications may be performed as noted.  +----------+---------------+----------+-----------+----------------------------+ RIGHT TCD Right VM (cm/s)Depth (cm)Pulsatility          Comment            +----------+---------------+----------+-----------+----------------------------+ MCA             24                    1.12     unable to insonate mid and                                                        proximal MCA         +----------+---------------+----------+-----------+----------------------------+ ACA  unable to  insonate      +----------+---------------+----------+-----------+----------------------------+ Term ICA        34                    1.18                                 +----------+---------------+----------+-----------+----------------------------+ PCA P1          62                    1.05                                 +----------+---------------+----------+-----------+----------------------------+ Opthalmic       32                    1.36                                 +----------+---------------+----------+-----------+----------------------------+ ICA siphon      29                    1.41                                 +----------+---------------+----------+-----------+----------------------------+ Vertebral                                          unable to insonate      +----------+---------------+----------+-----------+----------------------------+  +----------+--------------+----------+-----------+------------------+ LEFT TCD  Left VM (cm/s)Depth (cm)Pulsatility     Comment       +----------+--------------+----------+-----------+------------------+ MCA             37                   1.23                       +----------+--------------+----------+-----------+------------------+ ACA            -15                   1.36                       +----------+--------------+----------+-----------+------------------+ Term ICA        37                   0.97                       +----------+--------------+----------+-----------+------------------+ PCA P1         -37                   1.43                       +----------+--------------+----------+-----------+------------------+ Opthalmic       35                   1.45                       +----------+--------------+----------+-----------+------------------+ ICA siphon  33                   1.72                        +----------+--------------+----------+-----------+------------------+ Vertebral                                    unable to insonate +----------+--------------+----------+-----------+------------------+  +------------+-------+-------+             VM cm/sComment +------------+-------+-------+ Prox Basilar  -19          +------------+-------+-------+ Summary:  Poor windows throughout limit evaluation. Low normal mean flow velocities in majority of identified vessels of anterior and posterior cerebral circulations..Elevated right posterior cerebral artery mean flow velocities suggest mild stenosis.Globally elevate dpulsatility indices suggest diffuse intracranial atherosclerosis likely. *See table(s) above for TCD measurements and observations.  Diagnosing physician: Delia Heady MD Electronically signed by Delia Heady MD on 07/02/2023 at 11:46:03 AM.    Final    VAS US CAROTID (at [facility] and [facility] only) Result Date: 07/02/2023 Carotid Arterial Duplex Study Patient Name:  Gregory [name]  Date of Exam:   07/01/2023 Medical Rec #: [id number]     Accession #:    [id number] Date of Birth: 10-08-1960    Patient Gender: M Patient Age:   27 years Exam Location:  [facility] Procedure:      VAS US CAROTID Referring Phys: Milon Dikes --------------------------------------------------------------------------------  Indications:       CVA. Risk Factors:      Hypertension, hyperlipidemia, Diabetes, current smoker, prior                    CVA, PAD. Other Factors:     Right AKA. Limitations        Today's exam was limited due to the neck habitus of the                    patient, bandages, line and patient on a ventilator. Comparison Study:  Prior carotid duplex done 09/19/22 indicating bilateral 1-39%                    ICA stenosis Performing Technologist: Sherren Kerns RVS  Examination Guidelines: A complete evaluation includes B-mode imaging, spectral Doppler, color Doppler, and power Doppler as needed of all  accessible portions of each vessel. Bilateral testing is considered an integral part of a complete examination. Limited examinations for reoccurring indications may be performed as noted.  Right Carotid Findings: +--------+--------+--------+--------+------------------+--------+         PSV cm/sEDV cm/sStenosisPlaque DescriptionComments +--------+--------+--------+--------+------------------+--------+ ICA Prox                                          tortuous +--------+--------+--------+--------+------------------+--------+ ECA     182     21                                         +--------+--------+--------+--------+------------------+--------+  Left Carotid Findings: +--------+--------+--------+--------+------------------+--------+         PSV cm/sEDV cm/sStenosisPlaque DescriptionComments +--------+--------+--------+--------+------------------+--------+ CCA [id number]      27                                         +--------+--------+--------+--------+------------------+--------+  Summary: Right Carotid: Significantly limited study. Left Carotid: Significantly limited study.  *See table(s) above for measurements and observations.  Electronically signed by Delia Heady MD on 07/02/2023 at 11:43:28 AM.    Final    DG FL GUIDED LUMBAR PUNCTURE Result Date: 07/01/2023 CLINICAL DATA:  63 year old male with altered mental status. Lumbar puncture requested. EXAM: LUMBAR PUNCTURE UNDER FLUOROSCOPY PROCEDURE: An appropriate skin entry site was determined fluoroscopically. Operator donned sterile gloves and mask. Skin site was marked, then prepped with Betadine, draped in usual sterile fashion, and infiltrated locally with 1% lidocaine. A 20 gauge spinal needle advancement was attempted at L3-L4, L4-L5, and L5-S1 by this provider as well as additional attempts at L2-L3 and L3-L4 by Dr. Reche Dixon without return of CSF. After multiple unsuccessful attempts the needle was removed. The patient  tolerated the procedure well and there were no complications. FLUOROSCOPY: Radiation Exposure Index (as provided by the fluoroscopic device): 105.3 mGy Kerma IMPRESSION: Unsuccessful lumbar puncture under fluoroscopy. This exam was performed by Loyce Dys [name], and was supervised and interpreted by Jeronimo Greaves, MD. Electronically Signed   By: Jeronimo Greaves M.D.   On: 07/01/2023 08:14   DG Abd 1 View Result Date: 06/30/2023 CLINICAL DATA:  Ileus. EXAM: ABDOMEN - 1 VIEW COMPARISON:  06/27/2023 FINDINGS: The marked gaseous dilatation of the stomach seen on the previous study has resolved in the interval. No gaseous small bowel or colonic dilatation on the current study. NG tube tip is in the stomach with proximal side port below the GE junction. Rectal temperature probe overlies the low pelvis. IMPRESSION: Interval resolution of gaseous dilatation of the stomach. No gaseous small bowel or colonic dilatation on the current study. Electronically Signed   By: Kennith Center M.D.   On: 06/30/2023 06:34   DG CHEST PORT 1 VIEW Result Date: 06/30/2023 CLINICAL DATA:  62 year old male with respiratory failure, ventilator dependent. EXAM: PORTABLE CHEST 1 VIEW COMPARISON:  Portable chest 06/28/2023 and earlier. FINDINGS: Portable AP semi upright view at 0421 hours. Endotracheal tube tip in good position between the clavicles and carina. Stable right IJ central line. Enteric tube loops in the stomach. Lung volumes and mediastinal contours are stable and within normal limits. Mildly rotated to the left. Partially visible cervical spine fusion hardware. Regressed bilateral lower lung consolidation, perihilar opacity since 06/27/2023. No areas of worsening ventilation since that time. No pneumothorax or pleural effusion. No pulmonary edema. Paucity of bowel gas.  Stable visualized osseous structures. IMPRESSION: 1.  Stable lines and tubes. 2. Regressed bilateral consolidation/pneumonia since 06/27/2023. Bilateral improved  ventilation. No new cardiopulmonary abnormality. Electronically Signed   By: Odessa Fleming M.D.   On: 06/30/2023 05:42   ECHOCARDIOGRAM COMPLETE Result Date: 06/28/2023    ECHOCARDIOGRAM REPORT   Patient Name:   Gregory [name] Date of Exam: 06/28/2023 Medical Rec #:  [id number]    Height:       72.0 in Accession #:    [id number]   Weight:       212.5 lb Date of Birth:  Apr 02, 1961   BSA:          2.186 m Patient Age:    62 years     BP:           101/58 mmHg Patient Gender: M            HR:           90 bpm. Exam Location:  Inpatient Procedure: 2D Echo, Cardiac Doppler and  Color Doppler Indications:    Stroke I63.9  History:        Patient has prior history of Echocardiogram examinations, most                 recent 12/06/2022. CHF, COPD, Stroke and CKD, stage 3,                 Signs/Symptoms:Shortness of Breath; Risk Factors:Hypertension,                 Diabetes, Current Smoker and Dyslipidemia.  Sonographer:    Lucendia Herrlich RCS Referring Phys: [id number] ASHISH ARORA IMPRESSIONS  1. Left ventricular ejection fraction, by estimation, is 65 to 70%. The left ventricle has hyperdynamic function. The left ventricle has no regional wall motion abnormalities. Left ventricular diastolic parameters were normal.  2. Right ventricular systolic function is normal. The right ventricular size is mildly enlarged. Tricuspid regurgitation signal is inadequate for assessing PA pressure.  3. The mitral valve is normal in structure. No evidence of mitral valve regurgitation. No evidence of mitral stenosis.  4. The aortic valve is normal in structure. There is mild calcification of the aortic valve. There is mild thickening of the aortic valve. Aortic valve regurgitation is mild. Aortic valve sclerosis/calcification is present, without any evidence of aortic stenosis.  5. The inferior vena cava is normal in size with <50% respiratory variability, suggesting right atrial pressure of 8 mmHg. FINDINGS  Left Ventricle: Left ventricular  ejection fraction, by estimation, is 65 to 70%. The left ventricle has hyperdynamic function. The left ventricle has no regional wall motion abnormalities. The left ventricular internal cavity size was small. There is no  left ventricular hypertrophy. Left ventricular diastolic parameters were normal. Right Ventricle: The right ventricular size is mildly enlarged. No increase in right ventricular wall thickness. Right ventricular systolic function is normal. Tricuspid regurgitation signal is inadequate for assessing PA pressure. Left Atrium: Left atrial size was normal in size. Right Atrium: Right atrial size was normal in size. Pericardium: There is no evidence of pericardial effusion. Mitral Valve: The mitral valve is normal in structure. No evidence of mitral valve regurgitation. No evidence of mitral valve stenosis. Tricuspid Valve: The tricuspid valve is normal in structure. Tricuspid valve regurgitation is trivial. No evidence of tricuspid stenosis. Aortic Valve: The aortic valve is normal in structure. There is mild calcification of the aortic valve. There is mild thickening of the aortic valve. Aortic valve regurgitation is mild. Aortic valve sclerosis/calcification is present, without any evidence of aortic stenosis. Aortic valve mean gradient measures 10.0 mmHg. Aortic valve peak gradient measures 20.2 mmHg. Aortic valve area, by VTI measures 2.25 cm. Pulmonic Valve: The pulmonic valve was normal in structure. Pulmonic valve regurgitation is not visualized. No evidence of pulmonic stenosis. Aorta: The aortic root and ascending aorta are structurally normal, with no evidence of dilitation. Venous: The inferior vena cava is normal in size with less than 50% respiratory variability, suggesting right atrial pressure of 8 mmHg. IAS/Shunts: No atrial level shunt detected by color flow Doppler.  LEFT VENTRICLE PLAX 2D LVIDd:         5.00 cm   Diastology LVIDs:         3.60 cm   LV e' medial:    7.72 cm/s LV PW:          1.00 cm   LV E/e' medial:  9.5 LV IVS:        0.80 cm   LV e' lateral:  12.80 cm/s LVOT diam:     2.40 cm   LV E/e' lateral: 5.7 LV SV:         74 LV SV Index:   34 LVOT Area:     4.52 cm  RIGHT VENTRICLE             IVC RV S prime:     14.80 cm/s  IVC diam: 2.80 cm TAPSE (M-mode): 2.9 cm LEFT ATRIUM             Index        RIGHT ATRIUM           Index LA diam:        3.60 cm 1.65 cm/m   RA Area:     15.90 cm LA Vol (A2C):   28.8 ml 13.17 ml/m  RA Volume:   41.60 ml  19.03 ml/m LA Vol (A4C):   20.3 ml 9.29 ml/m LA Biplane Vol: 24.3 ml 11.11 ml/m  AORTIC VALVE AV Area (Vmax):    2.08 cm AV Area (Vmean):   2.16 cm AV Area (VTI):     2.25 cm AV Vmax:           224.50 cm/s AV Vmean:          144.500 cm/s AV VTI:            0.329 m AV Peak Grad:      20.2 mmHg AV Mean Grad:      10.0 mmHg LVOT Vmax:         103.33 cm/s LVOT Vmean:        69.033 cm/s LVOT VTI:          0.163 m LVOT/AV VTI ratio: 0.50  AORTA Ao Root diam: 3.40 cm Ao Asc diam:  3.40 cm MITRAL VALVE               TRICUSPID VALVE MV Area (PHT): 4.21 cm    TR Peak grad:   11.6 mmHg MV Decel Time: 180 msec    TR Vmax:        170.00 cm/s MV E velocity: 73.50 cm/s MV A velocity: 72.70 cm/s  SHUNTS MV E/A ratio:  1.01        Systemic VTI:  0.16 m                            Systemic Diam: 2.40 cm Clearnce Hasten Electronically signed by Clearnce Hasten Signature Date/Time: 06/28/2023/2:33:49 PM    Final    Overnight EEG with video Result Date: 06/28/2023 Charlsie Quest, MD     06/29/2023  9:12 AM Patient Name: Gregory [name] MRN: [id number] Epilepsy Attending: Charlsie Quest Referring Physician/Provider: Lynnae January, NP Duration: 06/27/2023 2356 to 06/29/2023 0200 Patient history: 63yo M with ams getting eeg to evaluate for seizure Level of alertness: comatose AEDs during EEG study: Propofol Technical aspects: This EEG study was done with scalp electrodes positioned according to the 10-20 International system of electrode placement.  Electrical activity was reviewed with band pass filter of 1-70Hz , sensitivity of 7 uV/mm, display speed of 29mm/sec with a 60Hz  notched filter applied as appropriate. EEG data were recorded continuously and digitally stored.  Video monitoring was available and reviewed as appropriate. Description: EEG showed continuous generalized 3 to 6 Hz theta-delta slowing. Hyperventilation and photic stimulation were not performed.   EEG was disconnected between 06/28/2023 1436 to 1639 ABNORMALITY - Continuous slow,  generalized IMPRESSION: This study is suggestive of severe diffuse encephalopathy. No seizures or epileptiform discharges were seen throughout the recording. Charlsie Quest   DG Chest Port 1 View Result Date: 06/28/2023 CLINICAL DATA:  [id number]. Encounter for pneumonia. Ventilator dependent respiratory failure. EXAM: PORTABLE CHEST 1 VIEW COMPARISON:  Chest CT without contrast yesterday at 7:51 p.m. FINDINGS: 3:56 a.m. ETT tip is 5 cm from the carina, NGT curves around to the left in the stomach with the tip in the upper medial lumen. Right IJ central line tip at the superior cavoatrial junction. Widespread right-greater-than-left airspace disease is again noted, with relative sparing in the left upper lung field. No pleural effusion is seen. There is mild cardiomegaly, mild central vascular prominence. Stable mediastinum. There is aortic atherosclerosis.  No acute or new osseous findings. IMPRESSION: 1. Widespread right-greater-than-left airspace disease, with relative sparing in the left upper lung field. No new or worsening infiltrate. 2. Mild cardiomegaly with mild central vascular prominence. 3. Support apparatus as above. 4. Aortic atherosclerosis. Electronically Signed   By: Almira Bar M.D.   On: 06/28/2023 05:58   MR BRAIN WO CONTRAST Result Date: 06/27/2023 CLINICAL DATA:  Initial evaluation for acute neuro deficit, stroke suspected. EXAM: MRI HEAD WITHOUT CONTRAST TECHNIQUE: Multiplanar, multiecho  pulse sequences of the brain and surrounding structures were obtained without intravenous contrast. COMPARISON:  Prior study from earlier the same day. FINDINGS: Brain: Age-related cerebral atrophy. Patchy and confluent T2/FLAIR hyperintensity involving the periventricular deep white matter both cerebral hemispheres, consistent with chronic small vessel ischemic disease, moderately advanced. Patchy involvement of the pons. Encephalomalacia and gliosis involving the right occipital lobe, consistent with a chronic left PCA distribution infarct. Associated chronic hemosiderin staining within this region. Superimposed restricted diffusion within the right temporal occipital region, consistent with an acute on chronic right PCA distribution infarct. Patchy involvement of the right thalamus. No associated hemorrhage or significant regional mass effect. No other evidence for acute or subacute ischemia. No acute intracranial hemorrhage. Chronic microhemorrhage at the anterior right temporal lobe noted. No mass lesion, midline shift or mass effect. No hydrocephalus or extra-axial fluid collection. Pituitary gland suprasellar region within normal limits. Vascular: Major intracranial vascular flow voids are maintained. Skull and upper cervical spine: Cranial junctional is. Postoperative changes partially visualize within the cervical spine. Bone marrow signal intensity normal. No scalp soft tissue abnormality. Sinuses/Orbits: Globes orbital soft tissues within normal limits. Mucosal thickening noted about the sphenoid ethmoidal and maxillary sinuses. Superimposed air-fluid level within the right maxillary sinus. Small bilateral mastoid effusions. Patient is intubated. Other: None. IMPRESSION: 1. 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 No associated hemorrhage or significant regional mass effect. 2. Underlying age-related cerebral atrophy with moderate chronic microvascular  ischemic disease. Electronically Signed   By: Rise Mu M.D.   On: 06/27/2023 21:28   CT CHEST ABDOMEN PELVIS WO CONTRAST Result Date: 06/27/2023 CLINICAL DATA:  Pneumonia, sepsis EXAM: CT CHEST, ABDOMEN AND PELVIS WITHOUT CONTRAST TECHNIQUE: Multidetector CT imaging of the chest, abdomen and pelvis was performed following the standard protocol without IV contrast. RADIATION DOSE REDUCTION: This exam was performed according to the departmental dose-optimization program which includes automated exposure control, adjustment of the mA and/or kV according to patient size and/or use of iterative reconstruction technique. COMPARISON:  CT abdomen and pelvis 02/08/2023. FINDINGS: CT CHEST FINDINGS Cardiovascular: Heart is normal size. Aorta is normal caliber. Moderate coronary artery and aortic calcifications. Mediastinum/Nodes: No mediastinal, hilar, or axillary adenopathy. Trachea  and esophagus are unremarkable. Endotracheal tube tip in the midtrachea. Thyroid unremarkable. Lungs/Pleura: Dense consolidation in both lower lobes compatible with pneumonia. No effusions. Patchy ground-glass opacities in the upper lobes as well. Musculoskeletal: Chest wall soft tissues are unremarkable. No acute bony abnormality. CT ABDOMEN PELVIS FINDINGS Hepatobiliary: Small layering gallstones within the gallbladder. No focal hepatic abnormality or biliary ductal dilatation. Pancreas: No focal abnormality or ductal dilatation. Spleen: No focal abnormality.  Normal size. Adrenals/Urinary Tract: Foley catheter in the bladder which is decompressed. No renal or adrenal mass. No stones or hydronephrosis. Stomach/Bowel: Prior colectomy. Right lower quadrant ostomy noted. No bowel obstruction. NG tube is in the stomach. Vascular/Lymphatic: Aortic atherosclerosis. No evidence of aneurysm or adenopathy. Reproductive: Mildly prominent prostate. Other: No free fluid or free air. Musculoskeletal: No acute bony abnormality. IMPRESSION:  Dense consolidation in both lower lobes with patchy opacities in the upper lobes. Findings compatible with multifocal pneumonia. Coronary artery disease, aortic atherosclerosis. Right lower quadrant ostomy.  No evidence of bowel obstruction. Cholelithiasis.  No CT evidence for acute cholecystitis. Electronically Signed   By: Charlett Nose M.D.   On: 06/27/2023 20:01   DG CHEST PORT 1 VIEW Result Date: 06/27/2023 CLINICAL DATA:  Central line placement. EXAM: PORTABLE CHEST 1 VIEW COMPARISON:  Same day. FINDINGS: Endotracheal and nasogastric tubes are unchanged. Interval placement of right internal jugular catheter with distal tip in expected position of the SVC. No pneumothorax is noted. Stable diffuse right lung interstitial opacity is noted concerning for asymmetric edema or atypical inflammation. IMPRESSION: Interval placement of right internal jugular catheter with distal tip in expected position of the SVC. Electronically Signed   By: Lupita Raider M.D.   On: 06/27/2023 12:40   CT HEAD WO CONTRAST ( ) Result Date: 06/27/2023 CLINICAL DATA:  Altered mental status. EXAM: CT HEAD WITHOUT CONTRAST TECHNIQUE: Contiguous axial images were obtained from the base of the skull through the vertex without intravenous contrast. RADIATION DOSE REDUCTION: This exam was performed according to the departmental dose-optimization program which includes automated exposure control, adjustment of the mA and/or kV according to patient size and/or use of iterative reconstruction technique. COMPARISON:  CT head dated February 08, 2023. FINDINGS: Brain: New loss of the normal gray-white matter differentiation along the inferior right temporal lobe (series 3, image 14; series 5, images 35-42). No evidence of hemorrhage, hydrocephalus, or extra-axial collection. Old right occipital lobe infarct again noted. Vascular: Calcified atherosclerosis at the skull base. No hyperdense vessel. Skull: Normal. Negative for fracture or focal  lesion. Sinuses/Orbits: No acute finding. Other: Similar postsurgical changes right pre maxillary soft tissues. IMPRESSION: 1. New loss of the normal gray-white matter differentiation along the inferior right temporal lobe, concerning for acute infarct. No hemorrhage. Consider further evaluation with MRI. 2. Old right occipital lobe infarct. Electronically Signed   By: Obie Dredge M.D.   On: 06/27/2023 11:00   DG Chest Port 1 View Result Date: 06/27/2023 CLINICAL DATA:  Ventilator dependent respiratory failure. [id number] encounter for orogastric tube placement. EXAM: PORTABLE ABDOMEN - 1 VIEW; PORTABLE CHEST - 1 VIEW COMPARISON:  Portable chest today at 6:10 a.m., abdomen and pelvis CT 02/08/2023 FINDINGS: Chest AP portable 6:48 a.m.: Interval intubation with tip of the ETT 4.6 cm from carina. NGT interval insertion. The heart is enlarged. There is worsening central vascular congestion and perihilar edema. There is increased right-greater-than-left perihilar interstitial Madilyn Fireman most likely due to ground-glass edema. The more peripheral lungs are essentially clear. Small pleural effusions are forming. The mediastinum  is stable.  No new osseous abnormality. Flat plate single anteverted view, portable at 6:51 a.m.: NGT curves to the left and cephalad within the stomach with the tip along the proximal fundus. Compared with CT 02/08/2023 there is severe dilatation of the stomach with air and food products. Correlate clinically for gastric outlet obstruction or impaired gastric emptying. The bowel pattern appears nonobstructive. There is no supine evidence of free air. No other significant radiographic findings. IMPRESSION: 1. Interval intubation with tip of the ETT 4.6 cm from carina. 2. NGT curves to the left and cephalad within the stomach with the tip along the proximal fundus. 3. Compared with CT 02/08/2023 there is severe dilatation of the stomach with air and food products. Correlate clinically for gastric outlet  obstruction or impaired gastric emptying. 4. Worsening central vascular congestion and perihilar edema. 5. Developing small pleural effusions. Electronically Signed   By: Almira Bar M.D.   On: 06/27/2023 07:27   DG Abd Portable 1V Result Date: 06/27/2023 CLINICAL DATA:  Ventilator dependent respiratory failure. [id number] encounter for orogastric tube placement. EXAM: PORTABLE ABDOMEN - 1 VIEW; PORTABLE CHEST - 1 VIEW COMPARISON:  Portable chest today at 6:10 a.m., abdomen and pelvis CT 02/08/2023 FINDINGS: Chest AP portable 6:48 a.m.: Interval intubation with tip of the ETT 4.6 cm from carina. NGT interval insertion. The heart is enlarged. There is worsening central vascular congestion and perihilar edema. There is increased right-greater-than-left perihilar interstitial Madilyn Fireman most likely due to ground-glass edema. The more peripheral lungs are essentially clear. Small pleural effusions are forming. The mediastinum is stable.  No new osseous abnormality. Flat plate single anteverted view, portable at 6:51 a.m.: NGT curves to the left and cephalad within the stomach with the tip along the proximal fundus. Compared with CT 02/08/2023 there is severe dilatation of the stomach with air and food products. Correlate clinically for gastric outlet obstruction or impaired gastric emptying. The bowel pattern appears nonobstructive. There is no supine evidence of free air. No other significant radiographic findings. IMPRESSION: 1. Interval intubation with tip of the ETT 4.6 cm from carina. 2. NGT curves to the left and cephalad within the stomach with the tip along the proximal fundus. 3. Compared with CT 02/08/2023 there is severe dilatation of the stomach with air and food products. Correlate clinically for gastric outlet obstruction or impaired gastric emptying. 4. Worsening central vascular congestion and perihilar edema. 5. Developing small pleural effusions. Electronically Signed   By: Almira Bar M.D.   On:  06/27/2023 07:27   DG Chest Port 1 View Result Date: 06/27/2023 CLINICAL DATA:  Unresponsive. EXAM: PORTABLE CHEST 1 VIEW COMPARISON:  02/08/2023 FINDINGS: The cardio pericardial silhouette is enlarged. Vascular congestion diffuse noted with diffuse interstitial opacity suggesting edema. No focal consolidation. No pneumothorax or pleural effusion. Gaseous distention of stomach or bowel is seen in the the left hemidiaphragm. Telemetry leads overlie the chest. IMPRESSION: Enlargement of the cardiopericardial silhouette with vascular congestion and diffuse interstitial opacity suggesting edema. Gaseous distention of stomach or bowel noted under the left hemidiaphragm. Electronically Signed   By: Kennith Center M.D.   On: 06/27/2023 06:32    Microbiology: Results for orders placed or performed during the hospital encounter of 06/27/23  Blood Culture (routine x 2)     Status: None   Collection Time: 06/27/23  6:00 AM   Specimen: BLOOD  Result Value Ref Range Status   Specimen Description BLOOD RIGHT ANTECUBITAL  Final   Special Requests   Final  BOTTLES DRAWN AEROBIC AND ANAEROBIC Blood Culture adequate volume   Culture   Final    NO GROWTH 5 DAYS Performed at [facility], [address], [place], [place] [address]    Report Status 07/02/2023 FINAL  Final  Blood Culture (routine x 2)     Status: None   Collection Time: 06/27/23  6:03 AM   Specimen: BLOOD RIGHT HAND  Result Value Ref Range Status   Specimen Description BLOOD RIGHT HAND  Final   Special Requests   Final    BOTTLES DRAWN AEROBIC ONLY Blood Culture adequate volume   Culture   Final    NO GROWTH 5 DAYS Performed at [facility], [address] [place], [place] [address]    Report Status 07/02/2023 FINAL  Final  Resp panel by RT-PCR (RSV, Flu A&B, Covid) Anterior Nasal Swab     Status: None   Collection Time: 06/27/23  6:04 AM   Specimen: Anterior Nasal Swab  Result Value Ref Range Status   SARS Coronavirus 2  by RT PCR NEGATIVE NEGATIVE Final   Influenza A by PCR NEGATIVE NEGATIVE Final   Influenza B by PCR NEGATIVE NEGATIVE Final    Comment: (NOTE) The Xpert Xpress SARS-CoV-2/FLU/RSV plus assay is intended as an aid in the diagnosis of influenza from Nasopharyngeal swab specimens and should not be used as a sole basis for treatment. Nasal washings and aspirates are unacceptable for Xpert Xpress SARS-CoV-2/FLU/RSV testing.  Fact Sheet for Patients: BloggerCourse.com  Fact Sheet for Healthcare Providers: SeriousBroker.it  This test is not yet approved or cleared by the [place] FDA and has been authorized for detection and/or diagnosis of SARS-CoV-2 by FDA under an Emergency Use Authorization (EUA). This EUA will remain in effect (meaning this test can be used) for the duration of the COVID-19 declaration under Section 564(b)(1) of the Act, 21 U.S.C. section 360bbb-3(b)(1), unless the authorization is terminated or revoked.     Resp Syncytial Virus by PCR NEGATIVE NEGATIVE Final    Comment: (NOTE) Fact Sheet for Patients: BloggerCourse.com  Fact Sheet for Healthcare Providers: SeriousBroker.it  This test is not yet approved or cleared by the [place] FDA and has been authorized for detection and/or diagnosis of SARS-CoV-2 by FDA under an Emergency Use Authorization (EUA). This EUA will remain in effect (meaning this test can be used) for the duration of the COVID-19 declaration under Section 564(b)(1) of the Act, 21 U.S.C. section 360bbb-3(b)(1), unless the authorization is terminated or revoked.  Performed at [facility], [address] [place], [place] [address]   Urine Culture     Status: Abnormal   Collection Time: 06/27/23  6:59 AM   Specimen: Urine, Random  Result Value Ref Range Status   Specimen Description URINE, RANDOM  Final   Special Requests    Final    NONE Reflexed from [id number] Performed at [facility], [address], [place], [place] [address]    Culture (A)  Final    >=100,000 COLONIES/mL ESCHERICHIA COLI Confirmed Extended Spectrum Beta-Lactamase Producer (ESBL).  In bloodstream infections from ESBL organisms, carbapenems are preferred over piperacillin/tazobactam. They are shown to have a lower risk of mortality.    Report Status 06/29/2023 FINAL  Final   Organism ID, Bacteria ESCHERICHIA COLI (A)  Final      Susceptibility   Escherichia coli - MIC*    AMPICILLIN >=32 RESISTANT Resistant     CEFAZOLIN >=64 RESISTANT Resistant  CEFEPIME 16 RESISTANT Resistant     CEFTRIAXONE >=64 RESISTANT Resistant     CIPROFLOXACIN >=4 RESISTANT Resistant     GENTAMICIN <=1 SENSITIVE Sensitive     IMIPENEM 0.5 SENSITIVE Sensitive     NITROFURANTOIN <=16 SENSITIVE Sensitive     TRIMETH/SULFA >=320 RESISTANT Resistant     AMPICILLIN/SULBACTAM >=32 RESISTANT Resistant     PIP/TAZO 64 INTERMEDIATE Intermediate ug/mL    * >=100,000 COLONIES/mL ESCHERICHIA COLI  C Difficile Quick Screen w PCR reflex     Status: None   Collection Time: 06/27/23  8:21 AM   Specimen: STOOL  Result Value Ref Range Status   C Diff antigen NEGATIVE NEGATIVE Final   C Diff toxin NEGATIVE NEGATIVE Final   C Diff interpretation No C. difficile detected.  Final    Comment: Performed at [facility], [address] [place], [place] [address]  Gastrointestinal Panel by PCR , Stool     Status: None   Collection Time: 06/27/23  8:21 AM   Specimen: STOOL  Result Value Ref Range Status   Campylobacter species NOT DETECTED NOT DETECTED Final   Plesimonas shigelloides NOT DETECTED NOT DETECTED Final   Salmonella species NOT DETECTED NOT DETECTED Final   Yersinia enterocolitica NOT DETECTED NOT DETECTED Final   Vibrio species NOT DETECTED NOT DETECTED Final   Vibrio cholerae NOT DETECTED NOT DETECTED Final   Enteroaggregative E coli (EAEC) NOT  DETECTED NOT DETECTED Final   Enteropathogenic E coli (EPEC) NOT DETECTED NOT DETECTED Final   Enterotoxigenic E coli (ETEC) NOT DETECTED NOT DETECTED Final   Shiga like toxin producing E coli (STEC) NOT DETECTED NOT DETECTED Final   Shigella/Enteroinvasive E coli (EIEC) NOT DETECTED NOT DETECTED Final   Cryptosporidium NOT DETECTED NOT DETECTED Final   Cyclospora cayetanensis NOT DETECTED NOT DETECTED Final   Entamoeba histolytica NOT DETECTED NOT DETECTED Final   Giardia lamblia NOT DETECTED NOT DETECTED Final   Adenovirus F40/41 NOT DETECTED NOT DETECTED Final   Astrovirus NOT DETECTED NOT DETECTED Final   Norovirus GI/GII NOT DETECTED NOT DETECTED Final   Rotavirus A NOT DETECTED NOT DETECTED Final   Sapovirus (I, II, IV, and V) NOT DETECTED NOT DETECTED Final    Comment: Performed at [facility], [address] [place], [place] [address]  Respiratory (~20 pathogens) panel by PCR     Status: Abnormal   Collection Time: 06/27/23  1:54 PM   Specimen: Nasopharyngeal Swab; Respiratory  Result Value Ref Range Status   Adenovirus NOT DETECTED NOT DETECTED Final   Coronavirus 229E NOT DETECTED NOT DETECTED Final    Comment: (NOTE) The Coronavirus on the Respiratory Panel, DOES NOT test for the novel  Coronavirus (2019 nCoV)    Coronavirus HKU1 NOT DETECTED NOT DETECTED Final   Coronavirus NL63 NOT DETECTED NOT DETECTED Final   Coronavirus OC43 NOT DETECTED NOT DETECTED Final   Metapneumovirus NOT DETECTED NOT DETECTED Final   Rhinovirus / Enterovirus DETECTED (A) NOT DETECTED Final   Influenza A NOT DETECTED NOT DETECTED Final   Influenza B NOT DETECTED NOT DETECTED Final   Parainfluenza Virus 1 NOT DETECTED NOT DETECTED Final   Parainfluenza Virus 2 NOT DETECTED NOT DETECTED Final   Parainfluenza Virus 3 NOT DETECTED NOT DETECTED Final   Parainfluenza Virus 4 NOT DETECTED NOT DETECTED Final   Respiratory Syncytial Virus NOT DETECTED NOT DETECTED Final   Bordetella  pertussis NOT DETECTED NOT DETECTED Final   Bordetella Parapertussis NOT DETECTED NOT DETECTED Final  Chlamydophila pneumoniae NOT DETECTED NOT DETECTED Final   Mycoplasma pneumoniae NOT DETECTED NOT DETECTED Final    Comment: Performed at [facility], [address] [place], [place] [address]  MRSA Next Gen by PCR, Nasal     Status: Abnormal   Collection Time: 06/27/23  2:51 PM   Specimen: Nasal Mucosa; Nasal Swab  Result Value Ref Range Status   MRSA by PCR Next Gen DETECTED (A) NOT DETECTED Final    Comment: RESULT CALLED TO, READ BACK BY AND VERIFIED WITH: RN NATE DAWKINS ON 06/27/23 @ 1825 BY DRT (NOTE) The GeneXpert MRSA Assay (FDA approved for NASAL specimens only), is one component of a comprehensive MRSA colonization surveillance program. It is not intended to diagnose MRSA infection nor to guide or monitor treatment for MRSA infections. Test performance is not FDA approved in patients less than 36 years old. Performed at [facility], [address], [place], [place] [address]   Culture, Respiratory w Gram Stain     Status: None   Collection Time: 06/28/23  3:06 AM   Specimen: Tracheal Aspirate; Respiratory  Result Value Ref Range Status   Specimen Description TRACHEAL ASPIRATE  Final   Special Requests NONE  Final   Gram Stain   Final    FEW WBC PRESENT, PREDOMINANTLY PMN FEW GRAM NEGATIVE RODS Performed at [facility], [address] [place], [place] [address]    Culture   Final    ABUNDANT KLEBSIELLA OXYTOCA ABUNDANT SERRATIA MARCESCENS    Report Status 07/01/2023 FINAL  Final   Organism ID, Bacteria KLEBSIELLA OXYTOCA  Final   Organism ID, Bacteria SERRATIA MARCESCENS  Final      Susceptibility   Klebsiella oxytoca - MIC*    AMPICILLIN >=32 RESISTANT Resistant     CEFEPIME <=0.12 SENSITIVE Sensitive     CEFTAZIDIME <=1 SENSITIVE Sensitive     CEFTRIAXONE <=0.25 SENSITIVE Sensitive     CIPROFLOXACIN <=0.25 SENSITIVE Sensitive      GENTAMICIN <=1 SENSITIVE Sensitive     IMIPENEM <=0.25 SENSITIVE Sensitive     TRIMETH/SULFA <=20 SENSITIVE Sensitive     AMPICILLIN/SULBACTAM >=32 RESISTANT Resistant     PIP/TAZO 16 SENSITIVE Sensitive ug/mL    * ABUNDANT KLEBSIELLA OXYTOCA   Serratia marcescens - MIC*    CEFEPIME <=0.12 SENSITIVE Sensitive     CEFTAZIDIME <=1 SENSITIVE Sensitive     CEFTRIAXONE >=64 RESISTANT Resistant     CIPROFLOXACIN <=0.25 SENSITIVE Sensitive     GENTAMICIN <=1 SENSITIVE Sensitive     TRIMETH/SULFA <=20 SENSITIVE Sensitive     * ABUNDANT SERRATIA MARCESCENS    Labs: CBC: Recent Labs  Lab 07/05/23 1043 07/07/23 0208  WBC 7.0 12.2*  HGB 9.0* 10.9*  HCT 29.2* 34.6*  MCV 92.1 89.4  PLT 166 295   Basic Metabolic Panel: Recent Labs  Lab 07/06/23 0251 07/07/23 0208 07/08/23 0212 07/09/23 0553 07/10/23 0600  NA 145 142 141 137 133*  K 5.4* 5.0 5.0 4.6 4.9  CL 118* 111 109 105 103  CO2 20* 19* 21* 21* 19*  GLUCOSE 236* 218* 223* 156* 137*  BUN 102* 96* 83* 75* 79*  CREATININE 1.72* 1.65* 1.68* 1.71* 1.84*  CALCIUM 9.2 9.4 9.7 9.4 9.1  MG 2.2  --   --   --   --   PHOS 4.9*  --   --   --   --    Liver Function Tests: No results for input(s): "AST", "ALT", "ALKPHOS", "BILITOT", "  PROT", "ALBUMIN" in the last 168 hours. CBG: Recent Labs  Lab 07/10/23 0748 07/10/23 1147 07/10/23 1633 07/10/23 2010 07/11/23 1217  GLUCAP 138* 213* 134* 146* 148*    Discharge time spent: less than 30 minutes.  Signed: Rickey Barbara, MD [facility] 07/11/2023

## 2023-07-11 NOTE — Plan of Care (Signed)
Report called to [facility] at [facility]

## 2023-07-15 ENCOUNTER — Emergency Department (HOSPITAL_COMMUNITY): Payer: Medicaid Other

## 2023-07-15 ENCOUNTER — Other Ambulatory Visit: Payer: Self-pay

## 2023-07-15 ENCOUNTER — Inpatient Hospital Stay (HOSPITAL_COMMUNITY)
Admission: EM | Admit: 2023-07-15 | Discharge: 2023-08-19 | DRG: 092 | Disposition: A | Discharge status: Skilled Nursing Facility | Payer: Medicaid Other | Source: Skilled Nursing Facility | Attending: Family Medicine | Admitting: Family Medicine

## 2023-07-15 DIAGNOSIS — I4891 Unspecified atrial fibrillation: Secondary | ICD-10-CM | POA: Diagnosis present

## 2023-07-15 DIAGNOSIS — E669 Obesity, unspecified: Secondary | ICD-10-CM | POA: Diagnosis present

## 2023-07-15 DIAGNOSIS — Z8042 Family history of malignant neoplasm of prostate: Secondary | ICD-10-CM

## 2023-07-15 DIAGNOSIS — G9340 Encephalopathy, unspecified: Secondary | ICD-10-CM | POA: Diagnosis present

## 2023-07-15 DIAGNOSIS — I959 Hypotension, unspecified: Secondary | ICD-10-CM | POA: Diagnosis not present

## 2023-07-15 DIAGNOSIS — Z85820 Personal history of malignant melanoma of skin: Secondary | ICD-10-CM

## 2023-07-15 DIAGNOSIS — Z89412 Acquired absence of left great toe: Secondary | ICD-10-CM

## 2023-07-15 DIAGNOSIS — Z7982 Long term (current) use of aspirin: Secondary | ICD-10-CM

## 2023-07-15 DIAGNOSIS — E7220 Disorder of urea cycle metabolism, unspecified: Secondary | ICD-10-CM | POA: Diagnosis present

## 2023-07-15 DIAGNOSIS — F0284 Dementia in other diseases classified elsewhere, unspecified severity, with anxiety: Secondary | ICD-10-CM | POA: Diagnosis present

## 2023-07-15 DIAGNOSIS — E8721 Acute metabolic acidosis: Secondary | ICD-10-CM | POA: Diagnosis not present

## 2023-07-15 DIAGNOSIS — E872 Acidosis, unspecified: Secondary | ICD-10-CM | POA: Diagnosis present

## 2023-07-15 DIAGNOSIS — E875 Hyperkalemia: Secondary | ICD-10-CM | POA: Diagnosis not present

## 2023-07-15 DIAGNOSIS — I130 Hypertensive heart and chronic kidney disease with heart failure and stage 1 through stage 4 chronic kidney disease, or unspecified chronic kidney disease: Secondary | ICD-10-CM | POA: Diagnosis present

## 2023-07-15 DIAGNOSIS — F0283 Dementia in other diseases classified elsewhere, unspecified severity, with mood disturbance: Secondary | ICD-10-CM | POA: Diagnosis present

## 2023-07-15 DIAGNOSIS — Y92239 Unspecified place in hospital as the place of occurrence of the external cause: Secondary | ICD-10-CM | POA: Diagnosis not present

## 2023-07-15 DIAGNOSIS — S81812A Laceration without foreign body, left lower leg, initial encounter: Secondary | ICD-10-CM | POA: Diagnosis present

## 2023-07-15 DIAGNOSIS — Z91040 Latex allergy status: Secondary | ICD-10-CM

## 2023-07-15 DIAGNOSIS — E1122 Type 2 diabetes mellitus with diabetic chronic kidney disease: Secondary | ICD-10-CM | POA: Diagnosis present

## 2023-07-15 DIAGNOSIS — N179 Acute kidney failure, unspecified: Secondary | ICD-10-CM | POA: Diagnosis present

## 2023-07-15 DIAGNOSIS — G928 Other toxic encephalopathy: Principal | ICD-10-CM | POA: Diagnosis present

## 2023-07-15 DIAGNOSIS — T426X5A Adverse effect of other antiepileptic and sedative-hypnotic drugs, initial encounter: Secondary | ICD-10-CM | POA: Diagnosis not present

## 2023-07-15 DIAGNOSIS — E871 Hypo-osmolality and hyponatremia: Secondary | ICD-10-CM | POA: Diagnosis not present

## 2023-07-15 DIAGNOSIS — E440 Moderate protein-calorie malnutrition: Secondary | ICD-10-CM | POA: Insufficient documentation

## 2023-07-15 DIAGNOSIS — Z85828 Personal history of other malignant neoplasm of skin: Secondary | ICD-10-CM

## 2023-07-15 DIAGNOSIS — Z933 Colostomy status: Secondary | ICD-10-CM

## 2023-07-15 DIAGNOSIS — Z5320 Procedure and treatment not carried out because of patient's decision for unspecified reasons: Secondary | ICD-10-CM | POA: Diagnosis present

## 2023-07-15 DIAGNOSIS — D631 Anemia in chronic kidney disease: Secondary | ICD-10-CM | POA: Diagnosis present

## 2023-07-15 DIAGNOSIS — Z6823 Body mass index (BMI) 23.0-23.9, adult: Secondary | ICD-10-CM

## 2023-07-15 DIAGNOSIS — Z8616 Personal history of COVID-19: Secondary | ICD-10-CM

## 2023-07-15 DIAGNOSIS — N390 Urinary tract infection, site not specified: Secondary | ICD-10-CM | POA: Diagnosis present

## 2023-07-15 DIAGNOSIS — Z96651 Presence of right artificial knee joint: Secondary | ICD-10-CM | POA: Diagnosis present

## 2023-07-15 DIAGNOSIS — E860 Dehydration: Secondary | ICD-10-CM | POA: Diagnosis present

## 2023-07-15 DIAGNOSIS — L89151 Pressure ulcer of sacral region, stage 1: Secondary | ICD-10-CM | POA: Diagnosis present

## 2023-07-15 DIAGNOSIS — R451 Restlessness and agitation: Secondary | ICD-10-CM

## 2023-07-15 DIAGNOSIS — D61818 Other pancytopenia: Secondary | ICD-10-CM | POA: Diagnosis present

## 2023-07-15 DIAGNOSIS — R001 Bradycardia, unspecified: Secondary | ICD-10-CM | POA: Diagnosis present

## 2023-07-15 DIAGNOSIS — R4182 Altered mental status, unspecified: Principal | ICD-10-CM

## 2023-07-15 DIAGNOSIS — W06XXXA Fall from bed, initial encounter: Secondary | ICD-10-CM | POA: Diagnosis not present

## 2023-07-15 DIAGNOSIS — Z515 Encounter for palliative care: Secondary | ICD-10-CM

## 2023-07-15 DIAGNOSIS — G546 Phantom limb syndrome with pain: Secondary | ICD-10-CM | POA: Diagnosis present

## 2023-07-15 DIAGNOSIS — Z8249 Family history of ischemic heart disease and other diseases of the circulatory system: Secondary | ICD-10-CM

## 2023-07-15 DIAGNOSIS — Z932 Ileostomy status: Secondary | ICD-10-CM

## 2023-07-15 DIAGNOSIS — Z8673 Personal history of transient ischemic attack (TIA), and cerebral infarction without residual deficits: Secondary | ICD-10-CM

## 2023-07-15 DIAGNOSIS — N319 Neuromuscular dysfunction of bladder, unspecified: Secondary | ICD-10-CM | POA: Diagnosis present

## 2023-07-15 DIAGNOSIS — J449 Chronic obstructive pulmonary disease, unspecified: Secondary | ICD-10-CM | POA: Diagnosis present

## 2023-07-15 DIAGNOSIS — Z7985 Long-term (current) use of injectable non-insulin antidiabetic drugs: Secondary | ICD-10-CM

## 2023-07-15 DIAGNOSIS — G4700 Insomnia, unspecified: Secondary | ICD-10-CM | POA: Diagnosis present

## 2023-07-15 DIAGNOSIS — Z9981 Dependence on supplemental oxygen: Secondary | ICD-10-CM

## 2023-07-15 DIAGNOSIS — G894 Chronic pain syndrome: Secondary | ICD-10-CM | POA: Diagnosis present

## 2023-07-15 DIAGNOSIS — Z79899 Other long term (current) drug therapy: Secondary | ICD-10-CM

## 2023-07-15 DIAGNOSIS — X58XXXA Exposure to other specified factors, initial encounter: Secondary | ICD-10-CM | POA: Diagnosis present

## 2023-07-15 DIAGNOSIS — F01518 Vascular dementia, unspecified severity, with other behavioral disturbance: Secondary | ICD-10-CM | POA: Diagnosis present

## 2023-07-15 DIAGNOSIS — Z89611 Acquired absence of right leg above knee: Secondary | ICD-10-CM

## 2023-07-15 DIAGNOSIS — Z781 Physical restraint status: Secondary | ICD-10-CM

## 2023-07-15 DIAGNOSIS — T361X5A Adverse effect of cephalosporins and other beta-lactam antibiotics, initial encounter: Secondary | ICD-10-CM | POA: Diagnosis present

## 2023-07-15 DIAGNOSIS — F329 Major depressive disorder, single episode, unspecified: Secondary | ICD-10-CM | POA: Diagnosis present

## 2023-07-15 DIAGNOSIS — Z833 Family history of diabetes mellitus: Secondary | ICD-10-CM

## 2023-07-15 DIAGNOSIS — N1832 Chronic kidney disease, stage 3b: Secondary | ICD-10-CM | POA: Diagnosis present

## 2023-07-15 DIAGNOSIS — F0153 Vascular dementia, unspecified severity, with mood disturbance: Secondary | ICD-10-CM | POA: Diagnosis present

## 2023-07-15 DIAGNOSIS — G3109 Other frontotemporal dementia: Secondary | ICD-10-CM | POA: Diagnosis present

## 2023-07-15 DIAGNOSIS — E785 Hyperlipidemia, unspecified: Secondary | ICD-10-CM | POA: Diagnosis present

## 2023-07-15 DIAGNOSIS — F05 Delirium due to known physiological condition: Secondary | ICD-10-CM

## 2023-07-15 DIAGNOSIS — F0154 Vascular dementia, unspecified severity, with anxiety: Secondary | ICD-10-CM | POA: Diagnosis present

## 2023-07-15 DIAGNOSIS — F02818 Dementia in other diseases classified elsewhere, unspecified severity, with other behavioral disturbance: Secondary | ICD-10-CM | POA: Diagnosis present

## 2023-07-15 DIAGNOSIS — Z794 Long term (current) use of insulin: Secondary | ICD-10-CM

## 2023-07-15 DIAGNOSIS — L03116 Cellulitis of left lower limb: Secondary | ICD-10-CM | POA: Diagnosis not present

## 2023-07-15 DIAGNOSIS — F17210 Nicotine dependence, cigarettes, uncomplicated: Secondary | ICD-10-CM | POA: Diagnosis present

## 2023-07-15 DIAGNOSIS — I5022 Chronic systolic (congestive) heart failure: Secondary | ICD-10-CM | POA: Diagnosis present

## 2023-07-15 DIAGNOSIS — E1169 Type 2 diabetes mellitus with other specified complication: Secondary | ICD-10-CM | POA: Diagnosis present

## 2023-07-15 DIAGNOSIS — E11649 Type 2 diabetes mellitus with hypoglycemia without coma: Secondary | ICD-10-CM | POA: Diagnosis not present

## 2023-07-15 LAB — CBC WITH DIFFERENTIAL/PLATELET
Abs Immature Granulocytes: 0.04 10*3/uL (ref 0.00–0.07)
Basophils Absolute: 0.2 10*3/uL — ABNORMAL HIGH (ref 0.0–0.1)
Basophils Relative: 2 %
Eosinophils Absolute: 0.4 10*3/uL (ref 0.0–0.5)
Eosinophils Relative: 4 %
HCT: 35.8 % — ABNORMAL LOW (ref 39.0–52.0)
Hemoglobin: 11.4 g/dL — ABNORMAL LOW (ref 13.0–17.0)
Immature Granulocytes: 0 %
Lymphocytes Relative: 28 %
Lymphs Abs: 3.1 10*3/uL (ref 0.7–4.0)
MCH: 28.6 pg (ref 26.0–34.0)
MCHC: 31.8 g/dL (ref 30.0–36.0)
MCV: 89.9 fL (ref 80.0–100.0)
Monocytes Absolute: 0.8 10*3/uL (ref 0.1–1.0)
Monocytes Relative: 7 %
Neutro Abs: 6.5 10*3/uL (ref 1.7–7.7)
Neutrophils Relative %: 59 %
Platelets: 297 10*3/uL (ref 150–400)
RBC: 3.98 MIL/uL — ABNORMAL LOW (ref 4.22–5.81)
RDW: 16.3 % — ABNORMAL HIGH (ref 11.5–15.5)
WBC: 11.1 10*3/uL — ABNORMAL HIGH (ref 4.0–10.5)
nRBC: 0 % (ref 0.0–0.2)

## 2023-07-15 LAB — URINALYSIS, ROUTINE W REFLEX MICROSCOPIC
Bacteria, UA: NONE SEEN
Bilirubin Urine: NEGATIVE
Glucose, UA: NEGATIVE mg/dL
Hgb urine dipstick: NEGATIVE
Ketones, ur: NEGATIVE mg/dL
Leukocytes,Ua: NEGATIVE
Nitrite: NEGATIVE
Protein, ur: 30 mg/dL — AB
Specific Gravity, Urine: 1.012 (ref 1.005–1.030)
pH: 5 (ref 5.0–8.0)

## 2023-07-15 LAB — I-STAT CHEM 8, ED
BUN: 52 mg/dL — ABNORMAL HIGH (ref 8–23)
Calcium, Ion: 1.37 mmol/L (ref 1.15–1.40)
Chloride: 109 mmol/L (ref 98–111)
Creatinine, Ser: 2 mg/dL — ABNORMAL HIGH (ref 0.61–1.24)
Glucose, Bld: 148 mg/dL — ABNORMAL HIGH (ref 70–99)
HCT: 38 % — ABNORMAL LOW (ref 39.0–52.0)
Hemoglobin: 12.9 g/dL — ABNORMAL LOW (ref 13.0–17.0)
Potassium: 4 mmol/L (ref 3.5–5.1)
Sodium: 141 mmol/L (ref 135–145)
TCO2: 20 mmol/L — ABNORMAL LOW (ref 22–32)

## 2023-07-15 LAB — I-STAT VENOUS BLOOD GAS, ED
Acid-base deficit: 5 mmol/L — ABNORMAL HIGH (ref 0.0–2.0)
Bicarbonate: 18.9 mmol/L — ABNORMAL LOW (ref 20.0–28.0)
Calcium, Ion: 1.32 mmol/L (ref 1.15–1.40)
HCT: 34 % — ABNORMAL LOW (ref 39.0–52.0)
Hemoglobin: 11.6 g/dL — ABNORMAL LOW (ref 13.0–17.0)
O2 Saturation: 79 %
Potassium: 4 mmol/L (ref 3.5–5.1)
Sodium: 140 mmol/L (ref 135–145)
TCO2: 20 mmol/L — ABNORMAL LOW (ref 22–32)
pCO2, Ven: 31.7 mm[Hg] — ABNORMAL LOW (ref 44–60)
pH, Ven: 7.383 (ref 7.25–7.43)
pO2, Ven: 43 mm[Hg] (ref 32–45)

## 2023-07-15 LAB — AMMONIA: Ammonia: 18 umol/L (ref 9–35)

## 2023-07-15 LAB — COMPREHENSIVE METABOLIC PANEL
ALT: 21 U/L (ref 0–44)
AST: 25 U/L (ref 15–41)
Albumin: 3.4 g/dL — ABNORMAL LOW (ref 3.5–5.0)
Alkaline Phosphatase: 99 U/L (ref 38–126)
Anion gap: 9 (ref 5–15)
BUN: 54 mg/dL — ABNORMAL HIGH (ref 8–23)
CO2: 18 mmol/L — ABNORMAL LOW (ref 22–32)
Calcium: 9.8 mg/dL (ref 8.9–10.3)
Chloride: 108 mmol/L (ref 98–111)
Creatinine, Ser: 1.75 mg/dL — ABNORMAL HIGH (ref 0.61–1.24)
GFR, Estimated: 43 mL/min — ABNORMAL LOW (ref >60)
Glucose, Bld: 124 mg/dL — ABNORMAL HIGH (ref 70–99)
Potassium: 3.8 mmol/L (ref 3.5–5.1)
Sodium: 135 mmol/L (ref 135–145)
Total Bilirubin: 0.7 mg/dL (ref 0.0–1.2)
Total Protein: 7.5 g/dL (ref 6.5–8.1)

## 2023-07-15 LAB — I-STAT CG4 LACTIC ACID, ED: Lactic Acid, Venous: 0.6 mmol/L (ref 0.5–1.9)

## 2023-07-15 MED ORDER — LORAZEPAM 2 MG/ML IJ SOLN: 0.5000 mg | Freq: Once | INTRAMUSCULAR | Status: DC

## 2023-07-15 MED ORDER — LORAZEPAM 1 MG PO TABS
1.0000 mg | ORAL_TABLET | Freq: Once | ORAL | Status: AC
  Administered 2023-07-15: 1 mg via ORAL
  Filled 2023-07-15: qty 1

## 2023-07-15 MED ORDER — MIDAZOLAM HCL 2 MG/2ML IJ SOLN
4.0000 mg | Freq: Once | INTRAMUSCULAR | Status: AC
  Administered 2023-07-15: 4 mg via INTRAVENOUS
  Filled 2023-07-15: qty 4

## 2023-07-15 MED ORDER — OXYCODONE HCL 5 MG PO TABS
5.0000 mg | ORAL_TABLET | Freq: Once | ORAL | Status: AC
  Administered 2023-07-15: 5 mg via ORAL
  Filled 2023-07-15: qty 1

## 2023-07-15 MED ORDER — DROPERIDOL 2.5 MG/ML IJ SOLN
2.5000 mg | Freq: Once | INTRAMUSCULAR | Status: AC
  Administered 2023-07-15: 2.5 mg via INTRAVENOUS
  Filled 2023-07-15: qty 2

## 2023-07-15 MED ORDER — LORAZEPAM 2 MG/ML IJ SOLN
1.0000 mg | Freq: Once | INTRAMUSCULAR | Status: AC
  Administered 2023-07-15: 1 mg via INTRAMUSCULAR
  Filled 2023-07-15: qty 1

## 2023-07-15 MED ORDER — MORPHINE SULFATE (PF) 2 MG/ML IV SOLN
2.0000 mg | Freq: Once | INTRAVENOUS | Status: AC
  Administered 2023-07-15: 2 mg via INTRAVENOUS
  Filled 2023-07-15: qty 1

## 2023-07-15 MED ORDER — LACTATED RINGERS IV BOLUS
500.0000 mL | Freq: Once | INTRAVENOUS | Status: AC
  Administered 2023-07-15: 500 mL via INTRAVENOUS

## 2023-07-15 NOTE — ED Notes (Signed)
While sitting in triage room, patient, while sitting in wheelchair, reached out and overturned portable EKG machine that was sitting beside him. Machine on the ground and Research scientist (medical). Patient denies overturning machine while at the same time throwing the lid to his cup on the floor in front of me.

## 2023-07-15 NOTE — ED Notes (Signed)
Peri care performed and brief changed after in/out catheter performed. New gown put on patient and ostomy bag reinforced with tape.

## 2023-07-15 NOTE — ED Notes (Signed)
Patient continues to yell out at staff as they walk by. Patient remains in the bed and currently has no complaints.

## 2023-07-15 NOTE — ED Provider Triage Note (Signed)
Emergency Medicine Provider Triage Evaluation Note  Gregory [name] , a 63 y.o. male  was evaluated in triage.  Pt complains of sent by facility due to acting out.  Patient reports he was throwing himself on the floor to get the male nurses attention.  Here patient just continually states he is ready to go back and is complaining of pain in his right stump.  No other specific complaints.  Review of Systems  Positive: Right stump pain but denies injury, facility noted change in mental status Negative: Chest pain, shortness of breath  Physical Exam  BP 107/71   Pulse 91   Temp 98.5 F (36.9 C)   Resp 18   SpO2 100%  Gen:   Awake Resp:  Normal effort  MSK:   Moves extremities without difficulty but has chronic venous stasis in the left lower extremity with some superficial abrasions and chronic skin findings.  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  Medical Decision Making  Medically screening exam initiated at 3:34 PM.  Appropriate orders placed.  [name] was informed that the remainder of the evaluation will be completed by another provider, this initial triage assessment does not replace that evaluation, and the importance of remaining in the ED until their evaluation is complete.     Gwyneth Sprout, MD 07/15/23 1536

## 2023-07-15 NOTE — ED Notes (Signed)
Please update daughter

## 2023-07-15 NOTE — ED Provider Notes (Signed)
[place] EMERGENCY DEPARTMENT AT [facility] Provider Note   CSN: [id number] Arrival date & time: 07/15/23  1505     History  Chief Complaint  Patient presents with   Altered Mental Status    Gregory [name] is a 63 y.o. male.  HPI 63 year old male history of COPD, sepsis secondary to UTI, encephalopathy, hypertension, type 2 diabetes, chronic pain, status post right AKA, left partial foot amputation, presents today from [facility] with reports that he attempted to throw himself on the floor.  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  She stated that this is different for him.  He was confused and came to the hospital but was improved when he left several days ago.    Home Medications Prior to Admission medications   Medication Sig Start Date End Date Taking? Authorizing Provider  acetaminophen (TYLENOL) 500 MG tablet Take 1,000 mg by mouth in the morning and at bedtime.    [provider]  aspirin EC 81 MG tablet Take 81 mg by mouth daily. Swallow whole.    [provider]  atorvastatin (LIPITOR) 80 MG tablet Take 1 tablet (80 mg total) by mouth daily. Patient taking differently: Take 80 mg by mouth at bedtime. 09/22/22 01/27/24  Jerald Kief, MD  carvedilol (COREG) 12.5 MG tablet Take 1 tablet (12.5 mg total) by mouth 2 (two) times daily with a meal. 12/25/22   Dorcas Carrow, MD  Cholecalciferol 25 MCG (1000 UT) capsule Take 2,000 Units by mouth daily.    [provider]  Cranberry 450 MG CAPS Take 450 mg by mouth daily.    [provider]  Dextran 70-Hypromellose (NATURAL BALANCE TEARS) 0.1-0.3 % SOLN Place 1 drop into both eyes every 4 (four) hours as needed (dry eyes).    [provider]  docusate (COLACE) 50 MG/5ML liquid Take 10 mLs  (100 mg total) by mouth 2 (two) times daily as needed for mild constipation. 07/11/23   Jerald Kief, MD  escitalopram (LEXAPRO) 20 MG tablet Take 20 mg by mouth daily.    [provider]  famotidine (PEPCID) 20 MG tablet Take 1 tablet (20 mg total) by mouth daily. 07/12/23 08/11/23  Jerald Kief, MD  ferrous sulfate 325 (65 FE) MG tablet Take 1 tablet (325 mg total) by mouth 3 (three) times daily with meals. Patient taking differently: Take 325 mg by mouth daily. With meal. 01/23/23   Cathren Laine, MD  fluticasone (FLONASE) 50 MCG/ACT nasal spray Place 1 spray into both nostrils daily.    [provider]  furosemide (LASIX) 20 MG tablet Take 20-40 mg by mouth See admin instructions. Take 40 mg by mouth in the morning and 20 mg by mouth in the afternoon    [provider]  haloperidol lactate (HALDOL) 5 MG/ML injection Inject 1 mg into the muscle every 6 (six) hours as needed (agitation/psychosis).    [provider]  hydrALAZINE (APRESOLINE) 10 MG tablet Take 1 tablet (10 mg total) by mouth every 8 (eight) hours. 12/25/22   Dorcas Carrow, MD  hydrOXYzine (VISTARIL) 50 MG capsule Take 50 mg by mouth at bedtime.    [provider]  insulin glargine (LANTUS SOLOSTAR) 100 UNIT/ML Solostar Pen Inject 15 Units into the skin daily. Patient taking differently: Inject 15 Units into the skin  at bedtime. 09/07/22   Rai, Ripudeep K, MD  insulin lispro (HUMALOG) 100 UNIT/ML KwikPen Inject 0-10 Units into the skin See admin instructions. Inject 0-10 units into the skin three times a day and at bedtime, PER SLIDING SCALE:  BGL 151-200 = 2 units 201-250 = 4 units 251-300 = 6 units 301-350 =  8 units 351-400 = 10 units Patient taking differently: Inject 0-10 Units into the skin See admin instructions. Inject 0-10 units into the skin before meals and at bedtime, PER SLIDING SCALE:  0-150 = 0 units 151-200 = 2 units 201-250 = 4 units 251-300 = 6 units 301-350 =  8  units 351-400 = 10 units 01/31/22   Dahal, Melina Schools, MD  ipratropium-albuterol (DUONEB) 0.5-2.5 (3) MG/3ML SOLN Take 3 mLs by nebulization every 6 (six) hours as needed (for shortness of breath).    [provider]  LACTOBACILLUS PO Take 1 capsule by mouth in the morning and at bedtime.    [provider]  lidocaine (LIDODERM) 5 % Place 2 patches onto the skin daily. Remove & Discard patch within 12 hours or as directed by MD Patient taking differently: Place 1 patch onto the skin in the morning and at bedtime. Take off after 12 hours 12/25/22   Dorcas Carrow, MD  loperamide (IMODIUM) 2 MG capsule Take 1 capsule (2 mg total) by mouth 2 (two) times daily. 12/25/22   Dorcas Carrow, MD  loratadine (CLARITIN) 10 MG tablet Take 10 mg by mouth in the morning.    [provider]  magnesium oxide (MAG-OX) 400 (240 Mg) MG tablet Take 400 mg by mouth in the morning, at noon, and at bedtime.    [provider]  Melatonin 10 MG TABS Take 10 mg by mouth at bedtime.    [provider]  Meth-Hyo-M Bl-Na Phos-Ph Sal (URO-MP) 118 MG CAPS Take 118 mg by mouth 2 (two) times daily.    [provider]  Multiple Vitamin (MULTIVITAMIN WITH MINERALS) TABS tablet Take 1 tablet by mouth daily. 12/25/22   Dorcas Carrow, MD  omeprazole (PRILOSEC) 20 MG capsule Take 20 mg by mouth at bedtime.    [provider]  ondansetron (ZOFRAN) 4 MG tablet Take 4 mg by mouth every 8 (eight) hours as needed for nausea or vomiting.    [provider]  oxybutynin (DITROPAN-XL) 5 MG 24 hr tablet Take 5 mg by mouth in the morning and at bedtime.    [provider]  oxyCODONE (OXY IR/ROXICODONE) 5 MG immediate release tablet Take 1 tablet (5 mg total) by mouth every 6 (six) hours as needed for moderate pain (pain score 4-6). 07/11/23   Jerald Kief, MD  oxyCODONE (OXYCONTIN) 15 mg 12 hr tablet Take 1 tablet (15 mg total) by mouth in the morning, at noon, and at bedtime.  07/11/23   Jerald Kief, MD  OXYGEN Inhale 2 L into the lungs as needed (to maintain sats above 90/SOB).    [provider]  oxymetazoline (AFRIN) 0.05 % nasal spray Place 1 spray into both nostrils 2 (two) times daily as needed for congestion.    [provider]  pregabalin (LYRICA) 100 MG capsule Take 1 capsule (100 mg total) by mouth 2 (two) times daily. Patient taking differently: Take 100 mg by mouth 3 (three) times daily. 12/25/22 06/27/23  Dorcas Carrow, MD  pregabalin (LYRICA) 25 MG capsule Take 25 mg by mouth 3 (three) times daily.    [provider]  QUEtiapine (SEROQUEL) 100 MG tablet Take 100 mg by mouth at bedtime.    [provider]  senna (SENOKOT) 8.6 MG TABS tablet Take 2 tablets by mouth at bedtime.    [provider]  sodium bicarbonate 650 MG tablet Take 650 mg by mouth 2 (two) times daily.    [provider]  tamsulosin (FLOMAX) 0.4 MG CAPS capsule Take 2 capsules (0.8 mg total) by mouth daily. Patient taking differently: Take 0.4 mg by mouth daily. 02/09/23   Peterson Ao, MD  tirzepatide [facility]) 2.5 MG/0.5ML Pen Inject 2.5 mg into the skin once a week. On Tuesdays    [provider]  trolamine salicylate (ASPERCREME) 10 % cream Apply 1 Application topically in the morning, at noon, in the evening, and at bedtime. Right leg    [provider]  UNABLE TO FIND Take 30 mLs by mouth in the morning and at bedtime. Med Name: liquid protein    [provider]      Allergies    Latex    Review of Systems   Review of Systems  Physical Exam Updated Vital Signs BP (!) 144/91   Pulse 83   Temp 99.6 F (37.6 C) (Rectal)   Resp (!) 24   SpO2 100%  Physical Exam Vitals and nursing note reviewed.  HENT:     Head: Normocephalic.     Right Ear: External ear normal.     Left Ear: External ear normal.     Nose: Nose normal.     Mouth/Throat:     Pharynx: Oropharynx is clear.  Eyes:      Extraocular Movements: Extraocular movements intact.     Pupils: Pupils are equal, round, and reactive to light.  Cardiovascular:     Rate and Rhythm: Normal rate and regular rhythm.     Pulses: Normal pulses.     Heart sounds: Normal heart sounds.  Pulmonary:     Effort: Pulmonary effort is normal.     Breath sounds: Normal breath sounds.  Abdominal:     General: Abdomen is flat. Bowel sounds are normal.     Palpations: Abdomen is soft.  Musculoskeletal:        General: Normal range of motion.     Cervical back: Normal range of motion.  Skin:    General: Skin is warm and dry.     Capillary Refill: Capillary refill takes less than 2 seconds.  Neurological:     General: No focal deficit present.     Mental Status: He is alert.  Psychiatric:        Mood and Affect: Mood normal.        Behavior: Behavior normal.     ED Results / Procedures / Treatments   Labs (all labs ordered are listed, but only abnormal results are displayed) Labs Reviewed  URINALYSIS, ROUTINE W REFLEX MICROSCOPIC - Abnormal; Notable for the following components:      Result Value   Protein, ur 30 (*)    All other components within normal limits  CBC WITH DIFFERENTIAL/PLATELET - Abnormal; Notable for the following components:   WBC 11.1 (*)    RBC 3.98 (*)    Hemoglobin 11.4 (*)    HCT 35.8 (*)    RDW 16.3 (*)    Basophils Absolute 0.2 (*)    All other components within normal limits  COMPREHENSIVE METABOLIC PANEL - Abnormal; Notable for the following components:   CO2 18 (*)    Glucose, Bld  124 (*)    BUN 54 (*)    Creatinine, Ser 1.75 (*)    Albumin 3.4 (*)    GFR, Estimated 43 (*)    All other components within normal limits  I-STAT CHEM 8, ED - Abnormal; Notable for the following components:   BUN 52 (*)    Creatinine, Ser 2.00 (*)    Glucose, Bld 148 (*)    TCO2 20 (*)    Hemoglobin 12.9 (*)    HCT 38.0 (*)    All other components within normal limits  I-STAT VENOUS BLOOD GAS, ED -  Abnormal; Notable for the following components:   pCO2, Ven 31.7 (*)    Bicarbonate 18.9 (*)    TCO2 20 (*)    Acid-base deficit 5.0 (*)    HCT 34.0 (*)    Hemoglobin 11.6 (*)    All other components within normal limits  CULTURE, BLOOD (ROUTINE X 2)  CULTURE, BLOOD (ROUTINE X 2)  AMMONIA  CBC WITH DIFFERENTIAL/PLATELET  BLOOD GAS, VENOUS  I-STAT CG4 LACTIC ACID, ED  I-STAT CG4 LACTIC ACID, ED    EKG None  Radiology DG Chest Port 1 View Result Date: 07/15/2023 CLINICAL DATA:  Altered mental status. EXAM: PORTABLE CHEST 1 VIEW COMPARISON:  07/06/2023 FINDINGS: Interval extubation and removal of enteric tube. Stable heart size and mediastinal contours. Improvement in bilateral lung opacities. Mild chronic bronchial thickening and interstitial coarsening. No pneumothorax or large pleural effusion. Postsurgical change from the cervical spine. IMPRESSION: 1. Improvement in bilateral lung opacities.  No definite residual. 2. Mild chronic bronchial thickening and interstitial coarsening. Electronically Signed   By: Narda Rutherford M.D.   On: 07/15/2023 22:13    Procedures .Critical Care  Performed by: Margarita Grizzle, MD Authorized by: Margarita Grizzle, MD   Critical care provider statement:    Critical care time (minutes):  80   Critical care end time:  07/15/2023 11:32 PM   Critical care was time spent personally by me on the following activities:  Development of treatment plan with patient or surrogate, discussions with consultants, evaluation of patient's response to treatment, examination of patient, ordering and review of laboratory studies, ordering and review of radiographic studies, ordering and performing treatments and interventions, pulse oximetry, re-evaluation of patient's condition and review of old charts     Medications Ordered in ED Medications  oxyCODONE (Oxy IR/ROXICODONE) immediate release tablet 5 mg (5 mg Oral Given 07/15/23 1547)  LORazepam (ATIVAN) injection 1 mg  (1 mg Intramuscular Given 07/15/23 1939)  lactated ringers bolus 500 mL (0 mLs Intravenous Stopped 07/15/23 2127)  midazolam (VERSED) injection 4 mg (4 mg Intravenous Given 07/15/23 2034)  LORazepam (ATIVAN) tablet 1 mg (1 mg Oral Given 07/15/23 2125)  droperidol (INAPSINE) 2.5 MG/ML injection 2.5 mg (2.5 mg Intravenous Given 07/15/23 2205)  morphine (PF) 2 MG/ML injection 2 mg (2 mg Intravenous Given 07/15/23 2206)    ED Course/ Medical Decision Making/ A&P Clinical Course as of 07/15/23 2335  Mon Jul 15, 2023  2155 CBC reviewed interpreted significant for leukocytosis 11,100 Mild anemia with hemoglobin of 11.4 [DR]    Clinical Course User Index [DR] Margarita Grizzle, MD                                 Medical Decision Making Amount and/or Complexity of Data Reviewed Labs: ordered. Radiology: ordered.  Risk Prescription drug management.  63 year old male with multiple chronical health  problems including diabetes, hypertension, CKD, COPD, chronic osteo, A-fib, heart failure, colostomy, with recent admission for encephalopathy thought to be secondary to pneumonia, ESBL UTI, rhinovirus infection who was septic, hypoxemic respiratory failure, with encephalopathy and agitation thought to be due to chronic pain and above presents today with worsening encephalopathy. Patient very agitated and required multiple rounds of medications Patient afebrile mild leukocytosis 11,100, urine without obvious source of infection Reviewed trach cultures that show he grew out Klebsiella and Serratia.  Note states that he completed 7 days of Merrem for UTI but I am unable to find urine culture Possible AKI with creatinine 2.0 which was 1.845 days ago Stable anemia No definite UTI Patient continued to be agitated and required multiple medications. At this time head CT is pending Patient will require hospitalization Patient signed out to Dr. Nevin Bloodgood pending head CT.        Final Clinical Impression(s) / ED  Diagnoses Final diagnoses:  Altered mental status, unspecified altered mental status type  Encephalopathy, unspecified type    Rx / DC Orders ED Discharge Orders     None         Margarita Grizzle, MD 07/15/23 [phone]

## 2023-07-15 NOTE — ED Notes (Signed)
Unable to obtain CBC, as patient jerked hand away before collection was complete. Patient continues to yell and be extremely loud and uncooperative.

## 2023-07-15 NOTE — ED Triage Notes (Signed)
Patient via EMS from [facility] after multiple times throwing himself on the floor. Patient states he was doing this on purpose to mess with the male employees and make them pick him up off the floor. He denies injury. There is significant animosity between the patient and the staff and per EMS "the staff came up with a crazy story to get him sent out."

## 2023-07-16 ENCOUNTER — Inpatient Hospital Stay (HOSPITAL_COMMUNITY): Payer: Medicaid Other

## 2023-07-16 DIAGNOSIS — Z8616 Personal history of COVID-19: Secondary | ICD-10-CM | POA: Diagnosis not present

## 2023-07-16 DIAGNOSIS — J449 Chronic obstructive pulmonary disease, unspecified: Secondary | ICD-10-CM

## 2023-07-16 DIAGNOSIS — F0284 Dementia in other diseases classified elsewhere, unspecified severity, with anxiety: Secondary | ICD-10-CM | POA: Diagnosis present

## 2023-07-16 DIAGNOSIS — D631 Anemia in chronic kidney disease: Secondary | ICD-10-CM | POA: Diagnosis present

## 2023-07-16 DIAGNOSIS — I130 Hypertensive heart and chronic kidney disease with heart failure and stage 1 through stage 4 chronic kidney disease, or unspecified chronic kidney disease: Secondary | ICD-10-CM | POA: Diagnosis present

## 2023-07-16 DIAGNOSIS — F451 Undifferentiated somatoform disorder: Secondary | ICD-10-CM | POA: Diagnosis not present

## 2023-07-16 DIAGNOSIS — F0283 Dementia in other diseases classified elsewhere, unspecified severity, with mood disturbance: Secondary | ICD-10-CM | POA: Diagnosis present

## 2023-07-16 DIAGNOSIS — E669 Obesity, unspecified: Secondary | ICD-10-CM | POA: Diagnosis present

## 2023-07-16 DIAGNOSIS — E785 Hyperlipidemia, unspecified: Secondary | ICD-10-CM

## 2023-07-16 DIAGNOSIS — Z8659 Personal history of other mental and behavioral disorders: Secondary | ICD-10-CM | POA: Diagnosis not present

## 2023-07-16 DIAGNOSIS — R4182 Altered mental status, unspecified: Secondary | ICD-10-CM | POA: Diagnosis not present

## 2023-07-16 DIAGNOSIS — W06XXXA Fall from bed, initial encounter: Secondary | ICD-10-CM | POA: Diagnosis not present

## 2023-07-16 DIAGNOSIS — Z515 Encounter for palliative care: Secondary | ICD-10-CM | POA: Diagnosis not present

## 2023-07-16 DIAGNOSIS — F02818 Dementia in other diseases classified elsewhere, unspecified severity, with other behavioral disturbance: Secondary | ICD-10-CM | POA: Diagnosis present

## 2023-07-16 DIAGNOSIS — I10 Essential (primary) hypertension: Secondary | ICD-10-CM

## 2023-07-16 DIAGNOSIS — E8721 Acute metabolic acidosis: Secondary | ICD-10-CM | POA: Diagnosis not present

## 2023-07-16 DIAGNOSIS — Y92239 Unspecified place in hospital as the place of occurrence of the external cause: Secondary | ICD-10-CM | POA: Diagnosis not present

## 2023-07-16 DIAGNOSIS — E119 Type 2 diabetes mellitus without complications: Secondary | ICD-10-CM

## 2023-07-16 DIAGNOSIS — Z7189 Other specified counseling: Secondary | ICD-10-CM | POA: Diagnosis not present

## 2023-07-16 DIAGNOSIS — E872 Acidosis, unspecified: Secondary | ICD-10-CM | POA: Diagnosis present

## 2023-07-16 DIAGNOSIS — R410 Disorientation, unspecified: Secondary | ICD-10-CM | POA: Diagnosis not present

## 2023-07-16 DIAGNOSIS — G9340 Encephalopathy, unspecified: Secondary | ICD-10-CM | POA: Diagnosis present

## 2023-07-16 DIAGNOSIS — F05 Delirium due to known physiological condition: Secondary | ICD-10-CM | POA: Diagnosis present

## 2023-07-16 DIAGNOSIS — D61818 Other pancytopenia: Secondary | ICD-10-CM | POA: Diagnosis present

## 2023-07-16 DIAGNOSIS — I5020 Unspecified systolic (congestive) heart failure: Secondary | ICD-10-CM | POA: Diagnosis not present

## 2023-07-16 DIAGNOSIS — I5022 Chronic systolic (congestive) heart failure: Secondary | ICD-10-CM | POA: Diagnosis present

## 2023-07-16 DIAGNOSIS — N1832 Chronic kidney disease, stage 3b: Secondary | ICD-10-CM

## 2023-07-16 DIAGNOSIS — X58XXXA Exposure to other specified factors, initial encounter: Secondary | ICD-10-CM | POA: Diagnosis present

## 2023-07-16 DIAGNOSIS — G928 Other toxic encephalopathy: Secondary | ICD-10-CM | POA: Diagnosis present

## 2023-07-16 DIAGNOSIS — F0153 Vascular dementia, unspecified severity, with mood disturbance: Secondary | ICD-10-CM | POA: Diagnosis present

## 2023-07-16 DIAGNOSIS — F01518 Vascular dementia, unspecified severity, with other behavioral disturbance: Secondary | ICD-10-CM | POA: Diagnosis present

## 2023-07-16 DIAGNOSIS — E440 Moderate protein-calorie malnutrition: Secondary | ICD-10-CM | POA: Diagnosis present

## 2023-07-16 DIAGNOSIS — F0154 Vascular dementia, unspecified severity, with anxiety: Secondary | ICD-10-CM | POA: Diagnosis present

## 2023-07-16 DIAGNOSIS — R451 Restlessness and agitation: Secondary | ICD-10-CM | POA: Diagnosis not present

## 2023-07-16 DIAGNOSIS — E7220 Disorder of urea cycle metabolism, unspecified: Secondary | ICD-10-CM | POA: Diagnosis present

## 2023-07-16 DIAGNOSIS — E1122 Type 2 diabetes mellitus with diabetic chronic kidney disease: Secondary | ICD-10-CM | POA: Diagnosis present

## 2023-07-16 DIAGNOSIS — E871 Hypo-osmolality and hyponatremia: Secondary | ICD-10-CM | POA: Diagnosis not present

## 2023-07-16 DIAGNOSIS — F329 Major depressive disorder, single episode, unspecified: Secondary | ICD-10-CM | POA: Diagnosis present

## 2023-07-16 LAB — RESPIRATORY PANEL BY PCR

## 2023-07-16 LAB — POCT I-STAT 7, (LYTES, BLD GAS, ICA,H+H)
Acid-base deficit: 6 mmol/L — ABNORMAL HIGH (ref 0.0–2.0)
Bicarbonate: 18.5 mmol/L — ABNORMAL LOW (ref 20.0–28.0)
Calcium, Ion: 1.4 mmol/L (ref 1.15–1.40)
HCT: 31 % — ABNORMAL LOW (ref 39.0–52.0)
Hemoglobin: 10.5 g/dL — ABNORMAL LOW (ref 13.0–17.0)
O2 Saturation: 93 %
Patient temperature: 97.7
Potassium: 3.9 mmol/L (ref 3.5–5.1)
Sodium: 142 mmol/L (ref 135–145)
TCO2: 20 mmol/L — ABNORMAL LOW (ref 22–32)
pCO2 arterial: 32.8 mm[Hg] (ref 32–48)
pH, Arterial: 7.358 (ref 7.35–7.45)
pO2, Arterial: 67 mm[Hg] — ABNORMAL LOW (ref 83–108)

## 2023-07-16 LAB — CBC
HCT: 32.5 % — ABNORMAL LOW (ref 39.0–52.0)
Hemoglobin: 10.4 g/dL — ABNORMAL LOW (ref 13.0–17.0)
MCH: 29.3 pg (ref 26.0–34.0)
MCHC: 32 g/dL (ref 30.0–36.0)
MCV: 91.5 fL (ref 80.0–100.0)
Platelets: 228 10*3/uL (ref 150–400)
RBC: 3.55 MIL/uL — ABNORMAL LOW (ref 4.22–5.81)
RDW: 16.2 % — ABNORMAL HIGH (ref 11.5–15.5)
WBC: 6 10*3/uL (ref 4.0–10.5)
nRBC: 0 % (ref 0.0–0.2)

## 2023-07-16 LAB — GLUCOSE, CAPILLARY
Glucose-Capillary: 123 mg/dL — ABNORMAL HIGH (ref 70–99)
Glucose-Capillary: 121 mg/dL — ABNORMAL HIGH (ref 70–99)
Glucose-Capillary: 140 mg/dL — ABNORMAL HIGH (ref 70–99)
Glucose-Capillary: 142 mg/dL — ABNORMAL HIGH (ref 70–99)

## 2023-07-16 LAB — BASIC METABOLIC PANEL
Anion gap: 10 (ref 5–15)
BUN: 47 mg/dL — ABNORMAL HIGH (ref 8–23)
CO2: 17 mmol/L — ABNORMAL LOW (ref 22–32)
Calcium: 9.5 mg/dL (ref 8.9–10.3)
Chloride: 110 mmol/L (ref 98–111)
Creatinine, Ser: 1.56 mg/dL — ABNORMAL HIGH (ref 0.61–1.24)
GFR, Estimated: 49 mL/min — ABNORMAL LOW (ref >60)
Glucose, Bld: 137 mg/dL — ABNORMAL HIGH (ref 70–99)
Potassium: 3.8 mmol/L (ref 3.5–5.1)
Sodium: 137 mmol/L (ref 135–145)

## 2023-07-16 LAB — TSH: TSH: 0.813 u[IU]/mL (ref 0.350–4.500)

## 2023-07-16 LAB — RAPID URINE DRUG SCREEN, HOSP PERFORMED
Amphetamines: NOT DETECTED
Barbiturates: NOT DETECTED
Benzodiazepines: POSITIVE — AB
Cocaine: NOT DETECTED
Opiates: POSITIVE — AB
Tetrahydrocannabinol: NOT DETECTED

## 2023-07-16 LAB — MRSA NEXT GEN BY PCR, NASAL: MRSA by PCR Next Gen: DETECTED — AB

## 2023-07-16 LAB — CORTISOL: Cortisol, Plasma: 7.6 ug/dL

## 2023-07-16 LAB — CBG MONITORING, ED: Glucose-Capillary: 125 mg/dL — ABNORMAL HIGH (ref 70–99)

## 2023-07-16 LAB — PROCALCITONIN: Procalcitonin: <0.1 ng/mL

## 2023-07-16 LAB — ETHANOL: Alcohol, Ethyl (B): <10 mg/dL (ref <10)

## 2023-07-16 LAB — MAGNESIUM: Magnesium: 1.7 mg/dL (ref 1.7–2.4)

## 2023-07-16 MED ORDER — DEXMEDETOMIDINE HCL IN NACL 400 MCG/100ML IV SOLN
0.0000 ug/kg/h | INTRAVENOUS | Status: DC
  Administered 2023-07-16: 0.7 ug/kg/h via INTRAVENOUS
  Administered 2023-07-16 (×2): 0.4 ug/kg/h via INTRAVENOUS
  Administered 2023-07-17: 0.7 ug/kg/h via INTRAVENOUS
  Filled 2023-07-16 (×3): qty 100

## 2023-07-16 MED ORDER — HYDRALAZINE HCL 20 MG/ML IJ SOLN: 10.0000 mg | INTRAMUSCULAR | Status: DC | PRN

## 2023-07-16 MED ORDER — DEXTROSE IN LACTATED RINGERS 5 % IV SOLN: INTRAVENOUS | Status: DC

## 2023-07-16 MED ORDER — INSULIN ASPART 100 UNIT/ML IJ SOLN
0.0000 [IU] | INTRAMUSCULAR | Status: DC
Start: 2023-07-16 — End: 2023-07-22
  Administered 2023-07-16 – 2023-07-17 (×9): 1 [IU] via SUBCUTANEOUS
  Administered 2023-07-18: 2 [IU] via SUBCUTANEOUS
  Administered 2023-07-18 – 2023-07-19 (×5): 1 [IU] via SUBCUTANEOUS
  Administered 2023-07-19 – 2023-07-20 (×2): 2 [IU] via SUBCUTANEOUS
  Administered 2023-07-20 – 2023-07-21 (×2): 1 [IU] via SUBCUTANEOUS

## 2023-07-16 MED ORDER — ARFORMOTEROL TARTRATE 15 MCG/2ML IN NEBU
15.0000 ug | INHALATION_SOLUTION | Freq: Two times a day (BID) | RESPIRATORY_TRACT | Status: DC
  Administered 2023-07-16 – 2023-08-05 (×25): 15 ug via RESPIRATORY_TRACT
  Filled 2023-07-16 (×41): qty 2

## 2023-07-16 MED ORDER — LACTATED RINGERS IV SOLN: INTRAVENOUS | Status: AC

## 2023-07-16 MED ORDER — VANCOMYCIN HCL 1750 MG/350ML IV SOLN
1750.0000 mg | Freq: Once | INTRAVENOUS | Status: AC
  Administered 2023-07-16: 1750 mg via INTRAVENOUS
  Filled 2023-07-16: qty 350

## 2023-07-16 MED ORDER — REVEFENACIN 175 MCG/3ML IN SOLN
175.0000 ug | Freq: Every day | RESPIRATORY_TRACT | Status: DC
  Administered 2023-07-16 – 2023-08-04 (×13): 175 ug via RESPIRATORY_TRACT
  Filled 2023-07-16 (×17): qty 3

## 2023-07-16 MED ORDER — ACETAMINOPHEN 325 MG PO TABS
650.0000 mg | ORAL_TABLET | ORAL | Status: DC | PRN
  Administered 2023-07-16 – 2023-07-18 (×2): 650 mg via ORAL
  Filled 2023-07-16 (×2): qty 2

## 2023-07-16 MED ORDER — IPRATROPIUM-ALBUTEROL 0.5-2.5 (3) MG/3ML IN SOLN
3.0000 mL | RESPIRATORY_TRACT | Status: DC | PRN
  Administered 2023-08-05: 3 mL via RESPIRATORY_TRACT

## 2023-07-16 MED ORDER — ESCITALOPRAM OXALATE 10 MG PO TABS
20.0000 mg | ORAL_TABLET | Freq: Every day | ORAL | Status: DC
  Administered 2023-07-16 – 2023-08-18 (×31): 20 mg via ORAL
  Filled 2023-07-16 (×34): qty 2

## 2023-07-16 MED ORDER — HYDRALAZINE HCL 10 MG PO TABS
10.0000 mg | ORAL_TABLET | Freq: Three times a day (TID) | ORAL | Status: DC
  Administered 2023-07-16 – 2023-08-13 (×48): 10 mg via ORAL
  Filled 2023-07-16 (×74): qty 1

## 2023-07-16 MED ORDER — BUDESONIDE 0.5 MG/2ML IN SUSP
0.5000 mg | Freq: Two times a day (BID) | RESPIRATORY_TRACT | Status: DC
  Administered 2023-07-16 – 2023-08-05 (×24): 0.5 mg via RESPIRATORY_TRACT
  Filled 2023-07-16 (×41): qty 2

## 2023-07-16 MED ORDER — DEXTRAN 70-HYPROMELLOSE 0.1-0.3 % OP SOLN: 1.0000 [drp] | OPHTHALMIC | Status: DC | PRN

## 2023-07-16 MED ORDER — FAMOTIDINE 20 MG PO TABS
20.0000 mg | ORAL_TABLET | Freq: Every day | ORAL | Status: DC
  Administered 2023-07-16 – 2023-08-18 (×31): 20 mg via ORAL
  Filled 2023-07-16 (×34): qty 1

## 2023-07-16 MED ORDER — ATORVASTATIN CALCIUM 80 MG PO TABS
80.0000 mg | ORAL_TABLET | Freq: Every day | ORAL | Status: DC
  Administered 2023-07-16 – 2023-08-18 (×32): 80 mg via ORAL
  Filled 2023-07-16 (×33): qty 1

## 2023-07-16 MED ORDER — CARVEDILOL 12.5 MG PO TABS
12.5000 mg | ORAL_TABLET | Freq: Two times a day (BID) | ORAL | Status: DC
  Administered 2023-07-16 – 2023-08-08 (×27): 12.5 mg via ORAL
  Filled 2023-07-16 (×34): qty 1

## 2023-07-16 MED ORDER — ORAL CARE MOUTH RINSE: 15.0000 mL | OROMUCOSAL | Status: DC | PRN

## 2023-07-16 MED ORDER — DOCUSATE SODIUM 100 MG PO CAPS: 100.0000 mg | ORAL_CAPSULE | Freq: Two times a day (BID) | ORAL | Status: DC | PRN

## 2023-07-16 MED ORDER — PANTOPRAZOLE SODIUM 40 MG PO TBEC
40.0000 mg | DELAYED_RELEASE_TABLET | Freq: Every day | ORAL | Status: DC
  Administered 2023-07-16 – 2023-08-18 (×31): 40 mg via ORAL
  Filled 2023-07-16 (×34): qty 1

## 2023-07-16 MED ORDER — PIPERACILLIN-TAZOBACTAM 3.375 G IVPB
3.3750 g | Freq: Three times a day (TID) | INTRAVENOUS | Status: DC
  Administered 2023-07-16 – 2023-07-17 (×4): 3.375 g via INTRAVENOUS
  Filled 2023-07-16 (×4): qty 50

## 2023-07-16 MED ORDER — VANCOMYCIN HCL 1000 MG IV SOLR
750.0000 mg | Freq: Two times a day (BID) | INTRAVENOUS | Status: DC
  Administered 2023-07-16: 750 mg via INTRAVENOUS
  Filled 2023-07-16 (×3): qty 15

## 2023-07-16 MED ORDER — VANCOMYCIN HCL 750 MG/150ML IV SOLN
750.0000 mg | Freq: Two times a day (BID) | INTRAVENOUS | Status: DC
  Filled 2023-07-16: qty 150

## 2023-07-16 MED ORDER — CHLORHEXIDINE GLUCONATE CLOTH 2 % EX PADS
6.0000 | MEDICATED_PAD | Freq: Every day | CUTANEOUS | Status: DC
Start: 2023-07-16 — End: 2023-07-20
  Administered 2023-07-16 – 2023-07-20 (×5): 6 via TOPICAL

## 2023-07-16 MED ORDER — HEPARIN SODIUM (PORCINE) 5000 UNIT/ML IJ SOLN
5000.0000 [IU] | Freq: Three times a day (TID) | INTRAMUSCULAR | Status: DC
  Administered 2023-07-16 – 2023-08-19 (×93): 5000 [IU] via SUBCUTANEOUS
  Filled 2023-07-16 (×97): qty 1

## 2023-07-16 MED ORDER — QUETIAPINE FUMARATE 100 MG PO TABS
100.0000 mg | ORAL_TABLET | Freq: Every day | ORAL | Status: DC
  Administered 2023-07-16: 100 mg via ORAL
  Filled 2023-07-16: qty 1

## 2023-07-16 MED ORDER — MAGNESIUM SULFATE 2 GM/50ML IV SOLN
2.0000 g | Freq: Once | INTRAVENOUS | Status: AC
  Administered 2023-07-16: 2 g via INTRAVENOUS
  Filled 2023-07-16: qty 50

## 2023-07-16 MED ORDER — POLYETHYLENE GLYCOL 3350 17 G PO PACK: 17.0000 g | PACK | Freq: Every day | ORAL | Status: DC | PRN

## 2023-07-16 MED ORDER — MUPIROCIN 2 % EX OINT
1.0000 | TOPICAL_OINTMENT | Freq: Two times a day (BID) | CUTANEOUS | Status: AC
  Administered 2023-07-16 – 2023-07-20 (×10): 1 via NASAL
  Filled 2023-07-16 (×3): qty 22

## 2023-07-16 MED ORDER — ARFORMOTEROL TARTRATE 15 MCG/2ML IN NEBU
15.0000 ug | INHALATION_SOLUTION | Freq: Two times a day (BID) | RESPIRATORY_TRACT | Status: DC
  Administered 2023-07-16: 15 ug via RESPIRATORY_TRACT
  Filled 2023-07-16: qty 2

## 2023-07-16 NOTE — Progress Notes (Signed)
PT Cancellation Note  Patient Details Name: Gregory [name] MRN: [id number] DOB: 25-Jul-1960   Cancelled Treatment:    Reason Eval/Treat Not Completed: Medical issues which prohibited therapy - pt agitated upon PT arrival to room, RN request PT hold today. Will check back tomorrow.   Marye Round, PT DPT Acute Rehabilitation Services Secure Chat Preferred  Office [phone]    Truddie Coco 07/16/2023, 9:29 AM

## 2023-07-16 NOTE — Consult Note (Signed)
WOC Nurse Consult Note: Reason for Consult: Requested to assess multiple wound on left leg and order supplies for his ileostomy. Wound type: Multiple superficial wounds and scabs on the left leg. Great toe amputation and second toe, partial amputation on the 3rd toe with a open wound with granulation tissue.  Pressure Injury POA: NA Measurement: multiples superficial wound on the front of his left leg and knee. From the ankle to the knee. Wound bed:  Granulation tissue, 100% Drainage (amount, consistency, odor) scant amout. Periwound: intact, old scar surrounding the wounds. Dressing procedure/placement/frequency: Apply Xeroform, change daily. Wrap with Kerlix since the toes, until the knee.  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  Treatment options for stomal/peristomal skin:  Order supplies: Ostomy barrier 2 3/4: lawson#2 and ostomy bag lawson# [id number] Output Black/brown Ostomy pouching: 2pc.   WOC team will not plan to follow further.  Please reconsult if further assistance is needed. Thank-you,  Denyse Amass BSN, RN, [employer], WOC  (Pager: [phone])

## 2023-07-16 NOTE — Procedures (Signed)
Patient Name: DAMARIOUS HOLTSCLAW  MRN: [id number]  Epilepsy Attending: Charlsie Quest  Referring Physician/Provider: Kalman Shan, MD  Date: 07/16/2023 Duration: 22.10 mins  Patient history: 63yo M with ams. EEG to evaluate for seizure.   Level of alertness: Awake  AEDs during EEG study:   Technical aspects: This EEG study was done with scalp electrodes positioned according to the 10-20 International system of electrode placement. Electrical activity was reviewed with band pass filter of 1-70Hz , sensitivity of 7 uV/mm, display speed of 21mm/sec with a 60Hz  notched filter applied as appropriate. EEG data were recorded continuously and digitally stored.  Video monitoring was available and reviewed as appropriate.  Description: EEG showed continuous generalized polymorphic sharply contoured 3 to 6 Hz theta-delta slowing. Hyperventilation and photic stimulation were not performed.     ABNORMALITY - Continuous slow, generalized  IMPRESSION: This study is suggestive of moderate diffuse encephalopathy. No seizures or epileptiform discharges were seen throughout the recording.  Dian Minahan Annabelle Harman

## 2023-07-16 NOTE — H&P (Signed)
NAME:  Gregory [name], MRN:  [id number], DOB:  1960-11-08, LOS: 0 ADMISSION DATE:  07/15/2023, CONSULTATION DATE:  1/28 REFERRING MD:  Dr. Pilar Plate, CHIEF COMPLAINT:  Agitation Delirium   History of Present Illness:  Patient is a 63 year old male with pertinent PMH DMT2, HTN, HLD, CKD 3B, COPD, chronic osteomyelitis s/p R AKA, L midfoot amputation, stroke, A-fib, HFpEF, mesenteric ischemia s/p colostomy 12/2022 presents to [facility] ED on 1/27 with agitation delirium.  Patient recently admitted on 1/9-1/23 with septic shock and AMS secondary to ESBL UTI and Serratia/Klebsiella multifocal pneumonia/positive rhinovirus.  Had acute respiratory failure requiring intubation.  Patient is MRI also showing acute on chronic right PCA infarct.  On discharge patient fully alert and mostly oriented and was discharged to [facility] on Lyrica 125 mg 3 times daily and Seroquel 100 Mg nightly.  On 1/27 patient was throwing himself on the floor at [facility].  More confused and was brought to [facility] ED for further eval.  On arrival patient alert.  Vital stable and afebrile.  UA clear.  CXR improved opacities from prior cxr 1/18. WBC 11.1 and LA 0.6 glucose 104.  Ammonia and VBG unremarkable.  CT head with no evidence of acute abnormality.  Patient agitated requiring multiple medications oxy, Ativan, Versed, morphine.  Despite medications patient required Precedex.  PCCM consulted for ICU admission  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)    Acquired absence of right leg below knee (HCC)    Acute osteomyelitis of calcaneum, right (HCC) 10/02/2016   Acute respiratory failure (HCC)    Amputation stump infection (HCC) 08/12/2018   Anemia    Basal cell carcinoma, eyelid    Cancer (HCC)    Candidiasis 08/12/2018   CHF (congestive heart failure) (HCC)    chronic diastolic    Chronic back pain    Chronic kidney disease    stage 3    Chronic multifocal  osteomyelitis (HCC)    of ankle and foot    Chronic pain syndrome    COPD (chronic obstructive pulmonary disease) (HCC)    DDD (degenerative disc disease), lumbar    Depression    major depressive disorder    Diabetes mellitus without complication (HCC)    type 2    Diabetic ulcer of toe of left foot (HCC) 10/02/2016   Difficult intubation    Difficulty in walking, not elsewhere classified    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   Foot drop, bilateral    Foot drop, right 12/27/2015   GERD (gastroesophageal reflux disease)    Herpesviral vesicular dermatitis    History of COVID-19 03/15/2022   Hyperlipidemia    Hyperlipidemia    Hypertension    Insomnia    Malignant melanoma of other parts of face (HCC)    Melanoma (HCC)    MRSA bacteremia    Muscle weakness (generalized)    Nasal congestion    Necrosis of toe (HCC) 07/08/2018   Neuromuscular dysfunction of bladder    Osteomyelitis (HCC)    Osteomyelitis (HCC)    Osteomyelitis (HCC)    right BKA   Other idiopathic peripheral autonomic neuropathy    Retention of urine, unspecified    Squamous cell carcinoma of skin    Stroke (HCC)    Unsteadiness on feet    Urinary retention    Urinary retention    Urinary tract infection    Wears dentures  Wears glasses      Significant Hospital Events: Including procedures, antibiotic start and stop dates in addition to other pertinent events   1/28 agitation delirium requiring Precedex; PCCM consulted  Interim History / Subjective:  See above  Objective   Blood pressure (!) 151/71, pulse 98, temperature 99.6 F (37.6 C), temperature source Rectal, resp. rate 19, SpO2 100%.        Intake/Output Summary (Last 24 hours) at 07/16/2023 0414 Last data filed at 07/15/2023 2127 Gross per 24 hour  Intake 500 ml  Output --  Net 500 ml   There were no vitals filed for this visit.  Examination: General:   NAD HEENT: MM pink/moist Neuro: Confused patient but able to state name; mae;  perrl CV: s1s2, RRR, no m/r/g PULM:  dim clear BS bilaterally; Room air GI: soft, bsx4 active  Extremities: warm/dry, R AKA; L toe amputation and skin tear on leg   Resolved Hospital Problem list     Assessment & Plan:   Acute encephalopathy/agitation delirium:  of unknown cause; had agitation delirium on recent admission requiring seroquel and acute on chronic PCA stroke; is on multiple psych meds at home facility. -CT head no acute abnormality; ammonia and UA WNL Plan: -Continue Precedex for agitation -Limit sedating meds -Resume p.o. meds later today able; may need to place NG tube if unable to take p.o. -Delirium precautions -Check UDS, ethanol, TSH -Consider MRI  COPD Plan: -Brovana, Pulmicort, Yupelri -As needed DuoNeb  Mild leukocytosis Plan: -Afebrile and wbc slightly lower than 9 days ago -will hold on initiating antibiotics for now -check pct and follow cultures  Chronic right PCA infarct Plan: -ASA and statin when able to take p.o.  HFrEF HTN HLD Plan: -Hold home meds for NPO -As needed hydralazine  T2DM Plan: -CBG monitoring  CKD 3B Plan: -Trend BMP / urinary output -Replace electrolytes as indicated -Avoid nephrotoxic agents, ensure adequate renal perfusion  Chronic pain Plan: -Hold home Oxy for now  Depression/anxiety Plan: -Resume meds when able to take p.o.   Best Practice (right click and "Reselect all SmartList Selections" daily)   Diet/type: NPO DVT prophylaxis prophylactic heparin  Pressure ulcer(s): present on admission  GI prophylaxis: H2B Lines: N/A Foley:  N/A Code Status:  full code Last date of multidisciplinary goals of care discussion [pending]  Labs   CBC: Recent Labs  Lab 07/15/23 1622 07/15/23 2022 07/15/23 2037  WBC  --  11.1*  --   NEUTROABS  --  6.5  --   HGB 12.9* 11.4* 11.6*  HCT 38.0* 35.8* 34.0*  MCV  --  89.9  --   PLT  --  297  --     Basic Metabolic Panel: Recent Labs  Lab  07/09/23 0553 07/10/23 0600 07/15/23 1622 07/15/23 2022 07/15/23 2037  NA 137 133* 141 135 140  K 4.6 4.9 4.0 3.8 4.0  CL 105 103 109 108  --   CO2 21* 19*  --  18*  --   GLUCOSE 156* 137* 148* 124*  --   BUN 75* 79* 52* 54*  --   CREATININE 1.71* 1.84* 2.00* 1.75*  --   CALCIUM 9.4 9.1  --  9.8  --    GFR: Estimated Creatinine Clearance: 46.8 mL/min (A) (by C-G formula based on SCr of 1.75 mg/dL (H)). Recent Labs  Lab 07/15/23 2022 07/15/23 2037  WBC 11.1*  --   LATICACIDVEN  --  0.6    Liver Function Tests: Recent Labs  Lab 07/15/23 2022  AST 25  ALT 21  ALKPHOS 99  BILITOT 0.7  PROT 7.5  ALBUMIN 3.4*   No results for input(s): "LIPASE", "AMYLASE" in the last 168 hours. Recent Labs  Lab 07/15/23 2022  AMMONIA 18    ABG    Component Value Date/Time   PHART 7.211 (L) 06/28/2023 0803   PCO2ART 51.2 (H) 06/28/2023 0803   PO2ART 99 06/28/2023 0803   HCO3 18.9 (L) 07/15/2023 2037   TCO2 20 (L) 07/15/2023 2037   ACIDBASEDEF 5.0 (H) 07/15/2023 2037   O2SAT 79 07/15/2023 2037     Coagulation Profile: No results for input(s): "INR", "PROTIME" in the last 168 hours.  Cardiac Enzymes: No results for input(s): "CKTOTAL", "CKMB", "CKMBINDEX", "TROPONINI" in the last 168 hours.  HbA1C: Hemoglobin A1C  Date/Time Value Ref Range Status  08/14/2016 12:00 AM 5.6  Final  01/18/2016 12:00 AM 5.5  Final   Hgb A1c MFr Bld  Date/Time Value Ref Range Status  06/27/2023 09:25 AM 6.4 (H) 4.8 - 5.6 % Final    Comment:    (NOTE) Pre diabetes:          5.7%-6.4%  Diabetes:              >6.4%  Glycemic control for   <7.0% adults with diabetes   12/03/2022 04:54 PM 6.9 (H) 4.8 - 5.6 % Final    Comment:    (NOTE) Pre diabetes:          5.7%-6.4%  Diabetes:              >6.4%  Glycemic control for   <7.0% adults with diabetes     CBG: Recent Labs  Lab 07/10/23 0748 07/10/23 1147 07/10/23 1633 07/10/23 2010 07/11/23 1217  GLUCAP 138* 213* 134* 146*  148*    Review of Systems:   Patient is encephalopathic; therefore, history has been obtained from chart review.    Past Medical History:  He,  has a past medical history of Acquired absence of left great toe (HCC), Acquired absence of right leg below knee [facility]), Acute osteomyelitis of calcaneum, right (HCC) (10/02/2016), Acute respiratory failure (HCC), Amputation stump infection (HCC) (08/12/2018), Anemia, Basal cell carcinoma, eyelid, Cancer (HCC), Candidiasis (08/12/2018), CHF (congestive heart failure) (HCC), Chronic back pain, Chronic kidney disease, Chronic multifocal osteomyelitis (HCC), Chronic pain syndrome, COPD (chronic obstructive pulmonary disease) (HCC), DDD (degenerative disc disease), lumbar, Depression, Diabetes mellitus without complication (HCC), Diabetic ulcer of toe of left foot (HCC) (10/02/2016), Difficult intubation, Difficulty in walking, not elsewhere classified, 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), Foot drop, bilateral, Foot drop, right (12/27/2015), GERD (gastroesophageal reflux disease), Herpesviral vesicular dermatitis, History of COVID-19 (03/15/2022), Hyperlipidemia, Hyperlipidemia, Hypertension, Insomnia, Malignant melanoma of other parts of face (HCC), Melanoma (HCC), MRSA bacteremia, Muscle weakness (generalized), Nasal congestion, Necrosis of toe (HCC) (07/08/2018), Neuromuscular dysfunction of bladder, Osteomyelitis (HCC), Osteomyelitis (HCC), Osteomyelitis (HCC), Other idiopathic peripheral autonomic neuropathy, Retention of urine, unspecified, Squamous cell carcinoma of skin, Stroke (HCC), Unsteadiness on feet, Urinary retention, Urinary retention, Urinary tract infection, Wears dentures, and Wears glasses.   Surgical History:   Past Surgical History:  Procedure Laterality Date   AMPUTATION Left 03/29/2017   Procedure: LEFT GREAT TOE AMPUTATION AT METATARSOPHALANGEAL JOINT;  Surgeon: Nadara Mustard, MD;  Location: [facility] OR;  Service: Orthopedics;  Laterality: Left;    AMPUTATION Right 03/29/2017   Procedure: RIGHT BELOW KNEE AMPUTATION;  Surgeon: Nadara Mustard, MD;  Location: [facility] OR;  Service: Orthopedics;  Laterality: Right;  AMPUTATION Left 06/06/2018   Procedure: LEFT 2ND TOE AMPUTATION;  Surgeon: Nadara Mustard, MD;  Location: [facility] OR;  Service: Orthopedics;  Laterality: Left;   AMPUTATION Right 11/19/2018   Procedure: AMPUTATION ABOVE KNEE;  Surgeon: Nadara Mustard, MD;  Location: [facility] OR;  Service: Orthopedics;  Laterality: Right;   ANTERIOR CERVICAL CORPECTOMY N/A 11/25/2015   Procedure: ANTERIOR CERVICAL FIVE CORPECTOMY Cervical four - six fusion;  Surgeon: Lisbeth Renshaw, MD;  Location: [facility] NEURO ORS;  Service: Neurosurgery;  Laterality: N/A;  ANTERIOR CERVICAL FIVE CORPECTOMY Cervical four - six fusion   APPLICATION OF WOUND VAC Right 10/22/2018   Procedure: Application Of Wound Vac;  Surgeon: Nadara Mustard, MD;  Location: [facility] OR;  Service: Orthopedics;  Laterality: Right;   CYSTOSCOPY WITH BIOPSY N/A 05/01/2022   Procedure: CYSTOSCOPY WITH URETHRAL BIOPSY;  Surgeon: Noel Christmas, MD;  Location: [facility] ORS;  Service: Urology;  Laterality: N/A;  1 HR   CYSTOSCOPY WITH RETROGRADE URETHROGRAM N/A 04/25/2018   Procedure: CYSTOSCOPY WITH RETROGRADE URETHROGRAM/ BALLOON DILATION;  Surgeon: Ihor Gully, MD;  Location: [facility] ORS;  Service: Urology;  Laterality: N/A;   CYSTOSCOPY WITH URETHRAL DILATATION N/A 05/01/2022   Procedure: BALLOON DILATION WITH  OPTILUME;  Surgeon: Noel Christmas, MD;  Location: [facility] ORS;  Service: Urology;  Laterality: N/A;   LAPAROTOMY N/A 12/03/2022   Procedure: EXPLORATORY LAPAROTOMY  total abdominal colectomy with end ileostomy;  Surgeon: Kinsinger, De Blanch, MD;  Location: [facility] OR;  Service: General;  Laterality: N/A;   MULTIPLE TOOTH EXTRACTIONS     POSTERIOR CERVICAL FUSION/FORAMINOTOMY N/A 11/29/2015   Procedure: Cervical Three-Cervical Seven Posterior Cervical Laminectomy with Fusion;  Surgeon: Lisbeth Renshaw, MD;  Location: [facility]  NEURO ORS;  Service: Neurosurgery;  Laterality: N/A;  Cervical Three-Cervical Seven Posterior Cervical Laminectomy with Fusion   STUMP REVISION Right 10/22/2018   Procedure: REVISION RIGHT BELOW KNEE AMPUTATION;  Surgeon: Nadara Mustard, MD;  Location: [facility] OR;  Service: Orthopedics;  Laterality: Right;   STUMP REVISION Right 12/05/2018   Procedure: Revision Right Above Knee Amputation;  Surgeon: Nadara Mustard, MD;  Location: [facility] OR;  Service: Orthopedics;  Laterality: Right;   STUMP REVISION Right 05/29/2019   Procedure: REVISION RIGHT ABOVE KNEE AMPUTATION;  Surgeon: Nadara Mustard, MD;  Location: [facility] OR;  Service: Orthopedics;  Laterality: Right;   STUMP REVISION Right 06/24/2019   Procedure: STUMP REVISION;  Surgeon: Nadara Mustard, MD;  Location: [facility] OR;  Service: Orthopedics;  Laterality: Right;     Social History:   reports that he has been smoking cigarettes. He has never used smokeless tobacco. He reports that he does not drink alcohol and does not use drugs.   Family History:  His family history includes Bone cancer in his father; Cancer in his father, mother, paternal grandfather, and another family member; Diabetes in his mother and another family member; Heart disease in his mother; Prostate cancer in his father.   Allergies Allergies  Allergen Reactions   Latex Other (See Comments)    "ALLERGIC," per [facility]     Home Medications  Prior to Admission medications   Medication Sig Start Date End Date Taking? Authorizing Provider  acetaminophen (TYLENOL) 500 MG tablet Take 1,000 mg by mouth in the morning and at bedtime.    [provider]  aspirin EC 81 MG tablet Take 81 mg by mouth daily. Swallow whole.    [provider]  atorvastatin (LIPITOR) 80 MG tablet Take 1 tablet (80 mg total)  by mouth daily. Patient taking differently: Take 80 mg by mouth at bedtime. 09/22/22 01/27/24  Jerald Kief, MD  carvedilol (COREG) 12.5 MG tablet Take 1 tablet (12.5 mg total) by mouth 2  (two) times daily with a meal. 12/25/22   Dorcas Carrow, MD  Cholecalciferol 25 MCG (1000 UT) capsule Take 2,000 Units by mouth daily.    [provider]  Cranberry 450 MG CAPS Take 450 mg by mouth daily.    [provider]  Dextran 70-Hypromellose (NATURAL BALANCE TEARS) 0.1-0.3 % SOLN Place 1 drop into both eyes every 4 (four) hours as needed (dry eyes).    [provider]  docusate (COLACE) 50 MG/5ML liquid Take 10 mLs (100 mg total) by mouth 2 (two) times daily as needed for mild constipation. 07/11/23   Jerald Kief, MD  escitalopram (LEXAPRO) 20 MG tablet Take 20 mg by mouth daily.    [provider]  famotidine (PEPCID) 20 MG tablet Take 1 tablet (20 mg total) by mouth daily. 07/12/23 08/11/23  Jerald Kief, MD  ferrous sulfate 325 (65 FE) MG tablet Take 1 tablet (325 mg total) by mouth 3 (three) times daily with meals. Patient taking differently: Take 325 mg by mouth daily. With meal. 01/23/23   Cathren Laine, MD  fluticasone (FLONASE) 50 MCG/ACT nasal spray Place 1 spray into both nostrils daily.    [provider]  furosemide (LASIX) 20 MG tablet Take 20-40 mg by mouth See admin instructions. Take 40 mg by mouth in the morning and 20 mg by mouth in the afternoon    [provider]  haloperidol lactate (HALDOL) 5 MG/ML injection Inject 1 mg into the muscle every 6 (six) hours as needed (agitation/psychosis).    [provider]  hydrALAZINE (APRESOLINE) 10 MG tablet Take 1 tablet (10 mg total) by mouth every 8 (eight) hours. 12/25/22   Dorcas Carrow, MD  hydrOXYzine (VISTARIL) 50 MG capsule Take 50 mg by mouth at bedtime.    [provider]  insulin glargine (LANTUS SOLOSTAR) 100 UNIT/ML Solostar Pen Inject 15 Units into the skin daily. Patient taking differently: Inject 15 Units into the skin at bedtime. 09/07/22   Rai, Ripudeep K, MD  insulin lispro (HUMALOG) 100 UNIT/ML KwikPen Inject 0-10 Units into the skin See admin  instructions. Inject 0-10 units into the skin three times a day and at bedtime, PER SLIDING SCALE:  BGL 151-200 = 2 units 201-250 = 4 units 251-300 = 6 units 301-350 =  8 units 351-400 = 10 units Patient taking differently: Inject 0-10 Units into the skin See admin instructions. Inject 0-10 units into the skin before meals and at bedtime, PER SLIDING SCALE:  0-150 = 0 units 151-200 = 2 units 201-250 = 4 units 251-300 = 6 units 301-350 =  8 units 351-400 = 10 units 01/31/22   Dahal, Melina Schools, MD  ipratropium-albuterol (DUONEB) 0.5-2.5 (3) MG/3ML SOLN Take 3 mLs by nebulization every 6 (six) hours as needed (for shortness of breath).    [provider]  LACTOBACILLUS PO Take 1 capsule by mouth in the morning and at bedtime.    [provider]  lidocaine (LIDODERM) 5 % Place 2 patches onto the skin daily. Remove & Discard patch within 12 hours or as directed by MD Patient taking differently: Place 1 patch onto the skin in the morning and at bedtime. Take off after 12 hours 12/25/22   Dorcas Carrow, MD  loperamide (IMODIUM) 2 MG capsule Take  1 capsule (2 mg total) by mouth 2 (two) times daily. 12/25/22   Dorcas Carrow, MD  loratadine (CLARITIN) 10 MG tablet Take 10 mg by mouth in the morning.    [provider]  magnesium oxide (MAG-OX) 400 (240 Mg) MG tablet Take 400 mg by mouth in the morning, at noon, and at bedtime.    [provider]  Melatonin 10 MG TABS Take 10 mg by mouth at bedtime.    [provider]  Meth-Hyo-M Bl-Na Phos-Ph Sal (URO-MP) 118 MG CAPS Take 118 mg by mouth 2 (two) times daily.    [provider]  Multiple Vitamin (MULTIVITAMIN WITH MINERALS) TABS tablet Take 1 tablet by mouth daily. 12/25/22   Dorcas Carrow, MD  omeprazole (PRILOSEC) 20 MG capsule Take 20 mg by mouth at bedtime.    [provider]  ondansetron (ZOFRAN) 4 MG tablet Take 4 mg by mouth every 8 (eight) hours as needed for nausea or vomiting.     [provider]  oxybutynin (DITROPAN-XL) 5 MG 24 hr tablet Take 5 mg by mouth in the morning and at bedtime.    [provider]  oxyCODONE (OXY IR/ROXICODONE) 5 MG immediate release tablet Take 1 tablet (5 mg total) by mouth every 6 (six) hours as needed for moderate pain (pain score 4-6). 07/11/23   Jerald Kief, MD  oxyCODONE (OXYCONTIN) 15 mg 12 hr tablet Take 1 tablet (15 mg total) by mouth in the morning, at noon, and at bedtime. 07/11/23   Jerald Kief, MD  OXYGEN Inhale 2 L into the lungs as needed (to maintain sats above 90/SOB).    [provider]  oxymetazoline (AFRIN) 0.05 % nasal spray Place 1 spray into both nostrils 2 (two) times daily as needed for congestion.    [provider]  pregabalin (LYRICA) 100 MG capsule Take 1 capsule (100 mg total) by mouth 2 (two) times daily. Patient taking differently: Take 100 mg by mouth 3 (three) times daily. 12/25/22 06/27/23  Dorcas Carrow, MD  pregabalin (LYRICA) 25 MG capsule Take 25 mg by mouth 3 (three) times daily.    [provider]  QUEtiapine (SEROQUEL) 100 MG tablet Take 100 mg by mouth at bedtime.    [provider]  senna (SENOKOT) 8.6 MG TABS tablet Take 2 tablets by mouth at bedtime.    [provider]  sodium bicarbonate 650 MG tablet Take 650 mg by mouth 2 (two) times daily.    [provider]  tamsulosin (FLOMAX) 0.4 MG CAPS capsule Take 2 capsules (0.8 mg total) by mouth daily. Patient taking differently: Take 0.4 mg by mouth daily. 02/09/23   Peterson Ao, MD  tirzepatide [facility]) 2.5 MG/0.5ML Pen Inject 2.5 mg into the skin once a week. On Tuesdays    [provider]  trolamine salicylate (ASPERCREME) 10 % cream Apply 1 Application topically in the morning, at noon, in the evening, and at bedtime. Right leg    [provider]  UNABLE TO FIND Take 30 mLs by mouth in the morning and at bedtime. Med Name: liquid protein    [provider]     Critical care time: 45 minutes     JD Anselm Lis [facility] 07/16/2023, 4:14 AM  Please see [email or web address] for pager details.  From 7A-7P if no response, please call [phone]. After hours, please call ELink [phone].

## 2023-07-16 NOTE — Progress Notes (Signed)
Seen on rounds this morning. Admitted at 0630 this morning for agitated delirium.   Acute encephalopathy  Agitation/Delirium  Ongoing from previous admission, apparently worse back at his facility. Appears that previous infections were treated and cleared. Pending procal, but no leukocytosis or fever.  - no CO2 retention  - ammonia, ethanol normal  - RVP, lactic, UA, CXR without evidence of infection - tsh, cortisol, ethanol, mag normal  - no evidence of benzo or alcohol withdrawal.  Query polypharmacy. Discharge medication list includes: haldol q6h prn, hydroxyzine 50mg  nightly, oxy 15mg  TID, 5mg  q6h prn, lyrica 100 BID, seroquel 100mg  nightly  Plan: - dc antibiotics if procal normal  - Weaning off of precedex. Able to come off this morning but per bedside RN got more agitated. Continue to wean aggressively  - avoid sedating medications  - attempt to remove unnecessary home meds  - frequent neuro checks  - reorient, lights on during day, off at night   Cc time: 32 minutes  Cristopher Peru, [name] [facility] 07/16/23 12:52 PM  Please see [email or web address] for pager details.  From 7A-7P if no response, please call [phone] After hours, please call ELink [phone]

## 2023-07-16 NOTE — Progress Notes (Signed)
EEG complete - results pending

## 2023-07-16 NOTE — ED Provider Notes (Addendum)
  Provider Note MRN:  [id number]  Arrival date & time: 07/16/23    ED Course and Medical Decision Making  Assumed care of patient at sign-out or upon transfer.  Acute on encephalopathy of unclear cause, required multiple medications to combat the agitation here in the emergency department.  Case discussed with both ICU and hospital service, plan is for hospitalist admission.  4 AM update: Patient is now awake and combative again, starting Precedex, admitted to ICU.  Marland KitchenCritical Care  Performed by: Sabas Sous, MD Authorized by: Sabas Sous, MD   Critical care provider statement:    Critical care time (minutes):  35   Critical care was necessary to treat or prevent imminent or life-threatening deterioration of the following conditions:  CNS failure or compromise   Critical care was time spent personally by me on the following activities:  Development of treatment plan with patient or surrogate, discussions with consultants, evaluation of patient's response to treatment, examination of patient, ordering and review of laboratory studies, ordering and review of radiographic studies, ordering and performing treatments and interventions, pulse oximetry, re-evaluation of patient's condition and review of old charts   Final Clinical Impressions(s) / ED Diagnoses     ICD-10-CM   1. Altered mental status, unspecified altered mental status type  R41.82     2. Encephalopathy, unspecified type  G93.40       ED Discharge Orders     None       Discharge Instructions   None     Elmer Sow. Pilar Plate, MD [facility] Emergency Medicine [facility] [email or web address]    Sabas Sous, MD 07/16/23 [id number]    Sabas Sous, MD 07/16/23 [phone]

## 2023-07-16 NOTE — Progress Notes (Signed)
Pharmacy Antibiotic Note  Gregory [name] is a 63 y.o. male admitted on 07/15/2023 with sepsis.  Pharmacy has been consulted for vancomycin, zosyn dosing.  Plan: Vancomycin 1750mg  x1 then 750mg  IV q 12h (eAUC 520) -goal trough 15-69mcg/mL, goal AUC 400-600 -levels PRN per protocol Zosyn 3.375 g IV q 8h F/u cultures, renal func, LOT   Weight: 80.6 kg (177 lb 11.1 oz)  Temp (24hrs), Avg:98.6 F (37 C), Min:97.6 F (36.4 C), Max:99.6 F (37.6 C)  Recent Labs  Lab 07/10/23 0600 07/15/23 1622 07/15/23 2022 07/15/23 2037 07/16/23 0545  WBC  --   --  11.1*  --  6.0  CREATININE 1.84* 2.00* 1.75*  --  1.56*  LATICACIDVEN  --   --   --  0.6  --     Estimated Creatinine Clearance: 52.5 mL/min (A) (by C-G formula based on SCr of 1.56 mg/dL (H)).    Allergies  Allergen Reactions   Latex Other (See Comments)    "ALLERGIC," per [facility]    Antimicrobials this admission: Zosyn 1/28> Vanc 1/28>  Dose adjustments this admission:   Microbiology results: 1/28 MRSA 1/28 BCX 1/28 RVP neg 1/27 BCX:   Thank you for allowing pharmacy to be a part of this patient's care.  Calton Dach, PharmD, BCCCP Clinical Pharmacist 07/16/2023 7:37 AM

## 2023-07-16 NOTE — Progress Notes (Signed)
OT Cancellation Note  Patient Details Name: Gregory [name] MRN: [id number] DOB: Dec 19, 1960   Cancelled Treatment:    Reason Eval/Treat Not Completed: Patient not medically ready (agitated, transfer from 4N to 68M)  Mateo Flow 07/16/2023, 9:29 AM

## 2023-07-17 DIAGNOSIS — J449 Chronic obstructive pulmonary disease, unspecified: Secondary | ICD-10-CM | POA: Diagnosis not present

## 2023-07-17 DIAGNOSIS — I10 Essential (primary) hypertension: Secondary | ICD-10-CM | POA: Diagnosis not present

## 2023-07-17 DIAGNOSIS — G9340 Encephalopathy, unspecified: Secondary | ICD-10-CM | POA: Diagnosis not present

## 2023-07-17 DIAGNOSIS — I5020 Unspecified systolic (congestive) heart failure: Secondary | ICD-10-CM

## 2023-07-17 LAB — GLUCOSE, CAPILLARY
Glucose-Capillary: 110 mg/dL — ABNORMAL HIGH (ref 70–99)
Glucose-Capillary: 117 mg/dL — ABNORMAL HIGH (ref 70–99)
Glucose-Capillary: 128 mg/dL — ABNORMAL HIGH (ref 70–99)
Glucose-Capillary: 130 mg/dL — ABNORMAL HIGH (ref 70–99)
Glucose-Capillary: 130 mg/dL — ABNORMAL HIGH (ref 70–99)
Glucose-Capillary: 136 mg/dL — ABNORMAL HIGH (ref 70–99)

## 2023-07-17 LAB — MAGNESIUM: Magnesium: 1.9 mg/dL (ref 1.7–2.4)

## 2023-07-17 LAB — BASIC METABOLIC PANEL
Anion gap: 11 (ref 5–15)
BUN: 44 mg/dL — ABNORMAL HIGH (ref 8–23)
CO2: 17 mmol/L — ABNORMAL LOW (ref 22–32)
Calcium: 9.4 mg/dL (ref 8.9–10.3)
Chloride: 112 mmol/L — ABNORMAL HIGH (ref 98–111)
Creatinine, Ser: 1.78 mg/dL — ABNORMAL HIGH (ref 0.61–1.24)
GFR, Estimated: 42 mL/min — ABNORMAL LOW (ref >60)
Glucose, Bld: 113 mg/dL — ABNORMAL HIGH (ref 70–99)
Potassium: 4.2 mmol/L (ref 3.5–5.1)
Sodium: 140 mmol/L (ref 135–145)

## 2023-07-17 MED ORDER — QUETIAPINE FUMARATE 100 MG PO TABS: 100.0000 mg | ORAL_TABLET | Freq: Two times a day (BID) | ORAL | Status: DC

## 2023-07-17 MED ORDER — QUETIAPINE FUMARATE 100 MG PO TABS
100.0000 mg | ORAL_TABLET | Freq: Two times a day (BID) | ORAL | Status: DC
Start: 2023-07-17 — End: 2023-07-17
  Administered 2023-07-17: 100 mg via ORAL
  Filled 2023-07-17: qty 1

## 2023-07-17 MED ORDER — OXYCODONE HCL 5 MG PO TABS
5.0000 mg | ORAL_TABLET | Freq: Three times a day (TID) | ORAL | Status: DC
  Administered 2023-07-17 – 2023-07-18 (×4): 5 mg via ORAL
  Filled 2023-07-17 (×4): qty 1

## 2023-07-17 MED ORDER — HALOPERIDOL LACTATE 5 MG/ML IJ SOLN
5.0000 mg | Freq: Four times a day (QID) | INTRAMUSCULAR | Status: DC | PRN
  Administered 2023-07-17 – 2023-07-31 (×24): 5 mg via INTRAVENOUS
  Filled 2023-07-17 (×23): qty 1

## 2023-07-17 MED ORDER — LORAZEPAM 2 MG/ML IJ SOLN
1.0000 mg | Freq: Four times a day (QID) | INTRAMUSCULAR | Status: DC | PRN
  Administered 2023-07-17 – 2023-07-19 (×6): 1 mg via INTRAVENOUS
  Filled 2023-07-17 (×7): qty 1

## 2023-07-17 MED ORDER — QUETIAPINE FUMARATE 100 MG PO TABS
200.0000 mg | ORAL_TABLET | Freq: Two times a day (BID) | ORAL | Status: DC
  Administered 2023-07-17 – 2023-07-21 (×8): 200 mg via ORAL
  Filled 2023-07-17 (×8): qty 2

## 2023-07-17 MED ORDER — OLANZAPINE 10 MG IM SOLR
2.5000 mg | Freq: Once | INTRAMUSCULAR | Status: AC | PRN
  Administered 2023-07-17: 2.5 mg via INTRAMUSCULAR
  Filled 2023-07-17: qty 10

## 2023-07-17 NOTE — Progress Notes (Signed)
NAME:  Gregory [name], MRN:  [id number], DOB:  Nov 14, 1960, LOS: 1 ADMISSION DATE:  07/15/2023, CONSULTATION DATE:  1/28 REFERRING MD:  Dr. Pilar Plate, CHIEF COMPLAINT:  Agitation Delirium   History of Present Illness:  Patient is a 63 year old male with pertinent PMH DMT2, HTN, HLD, CKD 3B, COPD, chronic osteomyelitis s/p R AKA, L midfoot amputation, stroke, A-fib, HFpEF, mesenteric ischemia s/p colostomy 12/2022 presents to [facility] ED on 1/27 with agitation delirium.  Patient recently admitted on 1/9-1/23 with septic shock and AMS secondary to ESBL UTI and Serratia/Klebsiella multifocal pneumonia/positive rhinovirus.  Had acute respiratory failure requiring intubation.  Patient is MRI also showing acute on chronic right PCA infarct.  On discharge patient fully alert and mostly oriented and was discharged to [facility] on Lyrica 125 mg 3 times daily and Seroquel 100 Mg nightly.  On 1/27 patient was throwing himself on the floor at [facility].  More confused and was brought to [facility] ED for further eval.  On arrival patient alert.  Vital stable and afebrile.  UA clear.  CXR improved opacities from prior cxr 1/18. WBC 11.1 and LA 0.6 glucose 104.  Ammonia and VBG unremarkable.  CT head with no evidence of acute abnormality.  Patient agitated requiring multiple medications oxy, Ativan, Versed, morphine.  Despite medications patient required Precedex.  PCCM consulted for ICU admission  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)    Acquired absence of right leg below knee (HCC)    Acute osteomyelitis of calcaneum, right (HCC) 10/02/2016   Acute respiratory failure (HCC)    Amputation stump infection (HCC) 08/12/2018   Anemia    Basal cell carcinoma, eyelid    Cancer (HCC)    Candidiasis 08/12/2018   CHF (congestive heart failure) (HCC)    chronic diastolic    Chronic back pain    Chronic kidney disease    stage 3    Chronic multifocal  osteomyelitis (HCC)    of ankle and foot    Chronic pain syndrome    COPD (chronic obstructive pulmonary disease) (HCC)    DDD (degenerative disc disease), lumbar    Depression    major depressive disorder    Diabetes mellitus without complication (HCC)    type 2    Diabetic ulcer of toe of left foot (HCC) 10/02/2016   Difficult intubation    Difficulty in walking, not elsewhere classified    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   Foot drop, bilateral    Foot drop, right 12/27/2015   GERD (gastroesophageal reflux disease)    Herpesviral vesicular dermatitis    History of COVID-19 03/15/2022   Hyperlipidemia    Hyperlipidemia    Hypertension    Insomnia    Malignant melanoma of other parts of face (HCC)    Melanoma (HCC)    MRSA bacteremia    Muscle weakness (generalized)    Nasal congestion    Necrosis of toe (HCC) 07/08/2018   Neuromuscular dysfunction of bladder    Osteomyelitis (HCC)    Osteomyelitis (HCC)    Osteomyelitis (HCC)    right BKA   Other idiopathic peripheral autonomic neuropathy    Retention of urine, unspecified    Squamous cell carcinoma of skin    Stroke (HCC)    Unsteadiness on feet    Urinary retention    Urinary retention    Urinary tract infection    Wears dentures  Wears glasses      Significant Hospital Events: Including procedures, antibiotic start and stop dates in addition to other pertinent events   1/28 agitation delirium requiring Precedex; PCCM consulted  Interim History / Subjective:  Remains delirious, agitated. Endorses hallucinations. Seroquel helped overnight.  Objective   Blood pressure 113/62, pulse 92, temperature 99.6 F (37.6 C), temperature source Oral, resp. rate (!) 23, height 6' (1.829 m), weight 81.7 kg, SpO2 97%.        Intake/Output Summary (Last 24 hours) at 07/17/2023 1255 Last data filed at 07/17/2023 1200 Gross per 24 hour  Intake 1355.86 ml  Output 1675 ml  Net -319.14 ml   Filed Weights   07/16/23 0637  07/16/23 0930  Weight: 80.6 kg 81.7 kg    Examination: General:   NAD HEENT: MM pink/moist Neuro: Confused patient but alert, able to state name; mae; perrl CV: s1s2, RRR, no m/r/g PULM:  dim clear BS bilaterally; Room air GI: soft, bsx4 active  Extremities: warm/dry, R AKA; L toe amputation and skin tear on leg   Resolved Hospital Problem list     Assessment & Plan:   Acute encephalopathy/agitation delirium:  of unknown cause; had agitation delirium on recent admission requiring seroquel and acute on chronic PCA stroke; is on multiple psych meds at home facility. -CT head no acute abnormality; ammonia and UA WNL Plan: -Stop precedex not real solution to delirium -Increase seroquel to BID, PRN Haldol added  COPD Plan: -Brovana, Pulmicort, Yupelri -As needed DuoNeb  Mild leukocytosis Plan: -Afebrile and wbc slightly lower than 9 days ago -will hold on initiating antibiotics for now -PCT negative, no source of infection identified  Chronic right PCA infarct Plan: -ASA and statin   HFrEF HTN HLD Plan: -Coreg, hydralazine PO resumed.  T2DM Plan: -CBG monitoring  CKD 3B Plan: -Trend BMP / urinary output -Replace electrolytes as indicated -Avoid nephrotoxic agents, ensure adequate renal perfusion  Chronic pain Plan: -Low dose oxy scheduled in case pain leading to delirium  Depression/anxiety Plan: -citalopram   Best Practice (right click and "Reselect all SmartList Selections" daily)   Diet/type: Regular consistency (see orders) DVT prophylaxis prophylactic heparin  Pressure ulcer(s): present on admission  GI prophylaxis: H2B Lines: N/A Foley:  N/A Code Status:  full code Last date of multidisciplinary goals of care discussion [pending]  Labs   CBC: Recent Labs  Lab 07/15/23 1622 07/15/23 2022 07/15/23 2037 07/16/23 0545 07/16/23 0807  WBC  --  11.1*  --  6.0  --   NEUTROABS  --  6.5  --   --   --   HGB 12.9* 11.4* 11.6* 10.4* 10.5*   HCT 38.0* 35.8* 34.0* 32.5* 31.0*  MCV  --  89.9  --  91.5  --   PLT  --  297  --  228  --     Basic Metabolic Panel: Recent Labs  Lab 07/15/23 1622 07/15/23 2022 07/15/23 2037 07/16/23 0545 07/16/23 0807 07/17/23 0924  NA 141 135 140 137 142 140  K 4.0 3.8 4.0 3.8 3.9 4.2  CL 109 108  --  110  --  112*  CO2  --  18*  --  17*  --  17*  GLUCOSE 148* 124*  --  137*  --  113*  BUN 52* 54*  --  47*  --  44*  CREATININE 2.00* 1.75*  --  1.56*  --  1.78*  CALCIUM  --  9.8  --  9.5  --  9.4  MG  --   --   --  1.7  --  1.9   GFR: Estimated Creatinine Clearance: 46 mL/min (A) (by C-G formula based on SCr of 1.78 mg/dL (H)). Recent Labs  Lab 07/15/23 2022 07/15/23 2037 07/16/23 0545  PROCALCITON  --   --  <0.10  WBC 11.1*  --  6.0  LATICACIDVEN  --  0.6  --     Liver Function Tests: Recent Labs  Lab 07/15/23 2022  AST 25  ALT 21  ALKPHOS 99  BILITOT 0.7  PROT 7.5  ALBUMIN 3.4*   No results for input(s): "LIPASE", "AMYLASE" in the last 168 hours. Recent Labs  Lab 07/15/23 2022  AMMONIA 18    ABG    Component Value Date/Time   PHART 7.358 07/16/2023 0807   PCO2ART 32.8 07/16/2023 0807   PO2ART 67 (L) 07/16/2023 0807   HCO3 18.5 (L) 07/16/2023 0807   TCO2 20 (L) 07/16/2023 0807   ACIDBASEDEF 6.0 (H) 07/16/2023 0807   O2SAT 93 07/16/2023 0807     Coagulation Profile: No results for input(s): "INR", "PROTIME" in the last 168 hours.  Cardiac Enzymes: No results for input(s): "CKTOTAL", "CKMB", "CKMBINDEX", "TROPONINI" in the last 168 hours.  HbA1C: Hemoglobin A1C  Date/Time Value Ref Range Status  08/14/2016 12:00 AM 5.6  Final  01/18/2016 12:00 AM 5.5  Final   Hgb A1c MFr Bld  Date/Time Value Ref Range Status  06/27/2023 09:25 AM 6.4 (H) 4.8 - 5.6 % Final    Comment:    (NOTE) Pre diabetes:          5.7%-6.4%  Diabetes:              >6.4%  Glycemic control for   <7.0% adults with diabetes   12/03/2022 04:54 PM 6.9 (H) 4.8 - 5.6 % Final     Comment:    (NOTE) Pre diabetes:          5.7%-6.4%  Diabetes:              >6.4%  Glycemic control for   <7.0% adults with diabetes     CBG: Recent Labs  Lab 07/16/23 1953 07/16/23 2358 07/17/23 0408 07/17/23 0753 07/17/23 1143  GLUCAP 142* 130* 130* 117* 110*    Review of Systems:   Patient is encephalopathic; therefore, history has been obtained from chart review.    Past Medical History:  He,  has a past medical history of Acquired absence of left great toe (HCC), Acquired absence of right leg below knee [facility]), Acute osteomyelitis of calcaneum, right (HCC) (10/02/2016), Acute respiratory failure (HCC), Amputation stump infection (HCC) (08/12/2018), Anemia, Basal cell carcinoma, eyelid, Cancer (HCC), Candidiasis (08/12/2018), CHF (congestive heart failure) (HCC), Chronic back pain, Chronic kidney disease, Chronic multifocal osteomyelitis (HCC), Chronic pain syndrome, COPD (chronic obstructive pulmonary disease) (HCC), DDD (degenerative disc disease), lumbar, Depression, Diabetes mellitus without complication (HCC), Diabetic ulcer of toe of left foot (HCC) (10/02/2016), Difficult intubation, Difficulty in walking, not elsewhere classified, 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), Foot drop, bilateral, Foot drop, right (12/27/2015), GERD (gastroesophageal reflux disease), Herpesviral vesicular dermatitis, History of COVID-19 (03/15/2022), Hyperlipidemia, Hyperlipidemia, Hypertension, Insomnia, Malignant melanoma of other parts of face (HCC), Melanoma (HCC), MRSA bacteremia, Muscle weakness (generalized), Nasal congestion, Necrosis of toe (HCC) (07/08/2018), Neuromuscular dysfunction of bladder, Osteomyelitis (HCC), Osteomyelitis (HCC), Osteomyelitis (HCC), Other idiopathic peripheral autonomic neuropathy, Retention of urine, unspecified, Squamous cell carcinoma of skin, Stroke (HCC), Unsteadiness on feet, Urinary retention, Urinary retention, Urinary tract infection, Wears  dentures, and Wears  glasses.   Surgical History:   Past Surgical History:  Procedure Laterality Date   AMPUTATION Left 03/29/2017   Procedure: LEFT GREAT TOE AMPUTATION AT METATARSOPHALANGEAL JOINT;  Surgeon: Nadara Mustard, MD;  Location: [facility] OR;  Service: Orthopedics;  Laterality: Left;   AMPUTATION Right 03/29/2017   Procedure: RIGHT BELOW KNEE AMPUTATION;  Surgeon: Nadara Mustard, MD;  Location: [facility] OR;  Service: Orthopedics;  Laterality: Right;   AMPUTATION Left 06/06/2018   Procedure: LEFT 2ND TOE AMPUTATION;  Surgeon: Nadara Mustard, MD;  Location: [facility] OR;  Service: Orthopedics;  Laterality: Left;   AMPUTATION Right 11/19/2018   Procedure: AMPUTATION ABOVE KNEE;  Surgeon: Nadara Mustard, MD;  Location: [facility] OR;  Service: Orthopedics;  Laterality: Right;   ANTERIOR CERVICAL CORPECTOMY N/A 11/25/2015   Procedure: ANTERIOR CERVICAL FIVE CORPECTOMY Cervical four - six fusion;  Surgeon: Lisbeth Renshaw, MD;  Location: [facility];  Service: Neurosurgery;  Laterality: N/A;  ANTERIOR CERVICAL FIVE CORPECTOMY Cervical four - six fusion   APPLICATION OF WOUND VAC Right 10/22/2018   Procedure: Application Of Wound Vac;  Surgeon: Nadara Mustard, MD;  Location: [facility] OR;  Service: Orthopedics;  Laterality: Right;   CYSTOSCOPY WITH BIOPSY N/A 05/01/2022   Procedure: CYSTOSCOPY WITH URETHRAL BIOPSY;  Surgeon: Noel Christmas, MD;  Location: [facility] ORS;  Service: Urology;  Laterality: N/A;  1 HR   CYSTOSCOPY WITH RETROGRADE URETHROGRAM N/A 04/25/2018   Procedure: CYSTOSCOPY WITH RETROGRADE URETHROGRAM/ BALLOON DILATION;  Surgeon: Ihor Gully, MD;  Location: [facility] ORS;  Service: Urology;  Laterality: N/A;   CYSTOSCOPY WITH URETHRAL DILATATION N/A 05/01/2022   Procedure: BALLOON DILATION WITH  OPTILUME;  Surgeon: Noel Christmas, MD;  Location: [facility] ORS;  Service: Urology;  Laterality: N/A;   LAPAROTOMY N/A 12/03/2022   Procedure: EXPLORATORY LAPAROTOMY  total abdominal colectomy with end ileostomy;  Surgeon: Kinsinger, De Blanch, MD;   Location: [facility] OR;  Service: General;  Laterality: N/A;   MULTIPLE TOOTH EXTRACTIONS     POSTERIOR CERVICAL FUSION/FORAMINOTOMY N/A 11/29/2015   Procedure: Cervical Three-Cervical Seven Posterior Cervical Laminectomy with Fusion;  Surgeon: Lisbeth Renshaw, MD;  Location: [facility];  Service: Neurosurgery;  Laterality: N/A;  Cervical Three-Cervical Seven Posterior Cervical Laminectomy with Fusion   STUMP REVISION Right 10/22/2018   Procedure: REVISION RIGHT BELOW KNEE AMPUTATION;  Surgeon: Nadara Mustard, MD;  Location: [facility] OR;  Service: Orthopedics;  Laterality: Right;   STUMP REVISION Right 12/05/2018   Procedure: Revision Right Above Knee Amputation;  Surgeon: Nadara Mustard, MD;  Location: [facility] OR;  Service: Orthopedics;  Laterality: Right;   STUMP REVISION Right 05/29/2019   Procedure: REVISION RIGHT ABOVE KNEE AMPUTATION;  Surgeon: Nadara Mustard, MD;  Location: [facility] OR;  Service: Orthopedics;  Laterality: Right;   STUMP REVISION Right 06/24/2019   Procedure: STUMP REVISION;  Surgeon: Nadara Mustard, MD;  Location: [facility] OR;  Service: Orthopedics;  Laterality: Right;     Social History:   reports that he has been smoking cigarettes. He has never used smokeless tobacco. He reports that he does not drink alcohol and does not use drugs.   Family History:  His family history includes Bone cancer in his father; Cancer in his father, mother, paternal grandfather, and another family member; Diabetes in his mother and another family member; Heart disease in his mother; Prostate cancer in his father.   Allergies Allergies  Allergen Reactions   Latex Other (See Comments)    "ALLERGIC,"  per [facility]     Home Medications  Prior to Admission medications   Medication Sig Start Date End Date Taking? Authorizing Provider  acetaminophen (TYLENOL) 500 MG tablet Take 1,000 mg by mouth in the morning and at bedtime.    [provider]  aspirin EC 81 MG tablet Take 81 mg by mouth daily. Swallow whole.     [provider]  atorvastatin (LIPITOR) 80 MG tablet Take 1 tablet (80 mg total) by mouth daily. Patient taking differently: Take 80 mg by mouth at bedtime. 09/22/22 01/27/24  Jerald Kief, MD  carvedilol (COREG) 12.5 MG tablet Take 1 tablet (12.5 mg total) by mouth 2 (two) times daily with a meal. 12/25/22   Dorcas Carrow, MD  Cholecalciferol 25 MCG (1000 UT) capsule Take 2,000 Units by mouth daily.    [provider]  Cranberry 450 MG CAPS Take 450 mg by mouth daily.    [provider]  Dextran 70-Hypromellose (NATURAL BALANCE TEARS) 0.1-0.3 % SOLN Place 1 drop into both eyes every 4 (four) hours as needed (dry eyes).    [provider]  docusate (COLACE) 50 MG/5ML liquid Take 10 mLs (100 mg total) by mouth 2 (two) times daily as needed for mild constipation. 07/11/23   Jerald Kief, MD  escitalopram (LEXAPRO) 20 MG tablet Take 20 mg by mouth daily.    [provider]  famotidine (PEPCID) 20 MG tablet Take 1 tablet (20 mg total) by mouth daily. 07/12/23 08/11/23  Jerald Kief, MD  ferrous sulfate 325 (65 FE) MG tablet Take 1 tablet (325 mg total) by mouth 3 (three) times daily with meals. Patient taking differently: Take 325 mg by mouth daily. With meal. 01/23/23   Cathren Laine, MD  fluticasone (FLONASE) 50 MCG/ACT nasal spray Place 1 spray into both nostrils daily.    [provider]  furosemide (LASIX) 20 MG tablet Take 20-40 mg by mouth See admin instructions. Take 40 mg by mouth in the morning and 20 mg by mouth in the afternoon    [provider]  haloperidol lactate (HALDOL) 5 MG/ML injection Inject 1 mg into the muscle every 6 (six) hours as needed (agitation/psychosis).    [provider]  hydrALAZINE (APRESOLINE) 10 MG tablet Take 1 tablet (10 mg total) by mouth every 8 (eight) hours. 12/25/22   Dorcas Carrow, MD  hydrOXYzine (VISTARIL) 50 MG capsule Take 50 mg by mouth at bedtime.    [provider]  insulin  glargine (LANTUS SOLOSTAR) 100 UNIT/ML Solostar Pen Inject 15 Units into the skin daily. Patient taking differently: Inject 15 Units into the skin at bedtime. 09/07/22   Rai, Ripudeep K, MD  insulin lispro (HUMALOG) 100 UNIT/ML KwikPen Inject 0-10 Units into the skin See admin instructions. Inject 0-10 units into the skin three times a day and at bedtime, PER SLIDING SCALE:  BGL 151-200 = 2 units 201-250 = 4 units 251-300 = 6 units 301-350 =  8 units 351-400 = 10 units Patient taking differently: Inject 0-10 Units into the skin See admin instructions. Inject 0-10 units into the skin before meals and at bedtime, PER SLIDING SCALE:  0-150 = 0 units 151-200 = 2 units 201-250 = 4 units 251-300 = 6 units 301-350 =  8 units 351-400 = 10 units 01/31/22   Dahal, Melina Schools, MD  ipratropium-albuterol (DUONEB) 0.5-2.5 (3) MG/3ML SOLN Take 3 mLs by nebulization every 6 (six) hours as needed (for shortness of breath).    [provider]  LACTOBACILLUS PO Take 1 capsule by mouth in the morning and at bedtime.    [provider]  lidocaine (LIDODERM) 5 % Place 2 patches onto the skin daily. Remove & Discard patch within 12 hours or as directed by MD Patient taking differently: Place 1 patch onto the skin in the morning and at bedtime. Take off after 12 hours 12/25/22   Dorcas Carrow, MD  loperamide (IMODIUM) 2 MG capsule Take 1 capsule (2 mg total) by mouth 2 (two) times daily. 12/25/22   Dorcas Carrow, MD  loratadine (CLARITIN) 10 MG tablet Take 10 mg by mouth in the morning.    [provider]  magnesium oxide (MAG-OX) 400 (240 Mg) MG tablet Take 400 mg by mouth in the morning, at noon, and at bedtime.    [provider]  Melatonin 10 MG TABS Take 10 mg by mouth at bedtime.    [provider]  Meth-Hyo-M Bl-Na Phos-Ph Sal (URO-MP) 118 MG CAPS Take 118 mg by mouth 2 (two) times daily.    [provider]  Multiple Vitamin (MULTIVITAMIN WITH MINERALS) TABS  tablet Take 1 tablet by mouth daily. 12/25/22   Dorcas Carrow, MD  omeprazole (PRILOSEC) 20 MG capsule Take 20 mg by mouth at bedtime.    [provider]  ondansetron (ZOFRAN) 4 MG tablet Take 4 mg by mouth every 8 (eight) hours as needed for nausea or vomiting.    [provider]  oxybutynin (DITROPAN-XL) 5 MG 24 hr tablet Take 5 mg by mouth in the morning and at bedtime.    [provider]  oxyCODONE (OXY IR/ROXICODONE) 5 MG immediate release tablet Take 1 tablet (5 mg total) by mouth every 6 (six) hours as needed for moderate pain (pain score 4-6). 07/11/23   Jerald Kief, MD  oxyCODONE (OXYCONTIN) 15 mg 12 hr tablet Take 1 tablet (15 mg total) by mouth in the morning, at noon, and at bedtime. 07/11/23   Jerald Kief, MD  OXYGEN Inhale 2 L into the lungs as needed (to maintain sats above 90/SOB).    [provider]  oxymetazoline (AFRIN) 0.05 % nasal spray Place 1 spray into both nostrils 2 (two) times daily as needed for congestion.    [provider]  pregabalin (LYRICA) 100 MG capsule Take 1 capsule (100 mg total) by mouth 2 (two) times daily. Patient taking differently: Take 100 mg by mouth 3 (three) times daily. 12/25/22 06/27/23  Dorcas Carrow, MD  pregabalin (LYRICA) 25 MG capsule Take 25 mg by mouth 3 (three) times daily.    [provider]  QUEtiapine (SEROQUEL) 100 MG tablet Take 100 mg by mouth at bedtime.    [provider]  senna (SENOKOT) 8.6 MG TABS tablet Take 2 tablets by mouth at bedtime.    [provider]  sodium bicarbonate 650 MG tablet Take 650 mg by mouth 2 (two) times daily.    [provider]  tamsulosin (FLOMAX) 0.4 MG CAPS capsule Take 2 capsules (0.8 mg total) by mouth daily. Patient taking differently: Take 0.4 mg by mouth daily. 02/09/23   Peterson Ao, MD  tirzepatide [facility]) 2.5 MG/0.5ML Pen Inject 2.5 mg into the skin once a week. On Tuesdays    [provider]   trolamine salicylate (ASPERCREME) 10 % cream Apply 1 Application topically in the morning, at noon, in the evening, and at bedtime. Right leg    [provider]  UNABLE TO FIND Take  30 mLs by mouth in the morning and at bedtime. Med Name: liquid protein    [provider]     Critical care time:    CRITICAL CARE Performed by: Karren Burly   Total critical care time: 32 minutes  Critical care time was exclusive of separately billable procedures and treating other patients.  Critical care was necessary to treat or prevent imminent or life-threatening deterioration.  Critical care was time spent personally by me on the following activities: development of treatment plan with patient and/or surrogate as well as nursing, discussions with consultants, evaluation of patient's response to treatment, examination of patient, obtaining history from patient or surrogate, ordering and performing treatments and interventions, ordering and review of laboratory studies, ordering and review of radiographic studies, pulse oximetry and re-evaluation of patient's condition.   Karren Burly, MD [facility] 07/17/2023, 12:55 PM  Please see [email or web address] for pager details. From 7A-7P if no response, please call [phone]. After hours, please call ELink [phone].

## 2023-07-17 NOTE — Evaluation (Signed)
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   Clinical Impression   Pt is a resident of SNF. He report being w/c dependent, he is not able to offer further information about PLOF due to impaired cognition and agitation, but likely dependent in bathing, dressing and toileting. Pt currently is able to roll with rail with supervision for lines, but not necessarily upon request. He completes side <>sit with+2 mod assist. Pt demonstrates poor sitting balance at EOB. He is able to bring a cup with a straw to his mouth to drink and is otherwise dependent in ADLs. Patient will benefit from continued inpatient follow up therapy, <3 hours/day.       If plan is discharge home, recommend the following: Two people to help with walking and/or transfers;Two people to help with bathing/dressing/bathroom;Assistance with cooking/housework;Assistance with feeding;Direct supervision/assist for medications management;Direct supervision/assist for financial management;Assist for transportation;Help with stairs or ramp for entrance    Functional Status Assessment  Patient has had a recent decline in their functional status and/or demonstrates limited ability to make significant improvements in function in a reasonable and predictable amount of time  Equipment Recommendations  Other (comment) (defer)    Recommendations for Other Services       Precautions / Restrictions Precautions Precautions: Fall Required Braces or Orthoses:  (no prosthesis) Restrictions Weight Bearing Restrictions Per Provider Order: No       Mobility Bed Mobility Overal bed mobility: Needs Assistance Bed Mobility: Rolling, Sidelying to Sit, Sit to Sidelying Rolling: Supervision Sidelying to sit: +2 for physical assistance, Mod assist     Sit to sidelying: Mod assist, +2 for physical assistance General bed mobility comments: rolling with bed rails with supervision for lines, assist for R LE over EOB and to raise trunk into sitting, guided trunk to sidelying and assist for R LE back into bed    Transfers                   General transfer comment: deferred      Balance Overall balance assessment: Needs assistance   Sitting balance-Leahy Scale: Poor Sitting balance - Comments: Mod to CGA briefly at EOB                                   ADL either performed or assessed with clinical judgement   ADL                                         General ADL Comments: pt able to drink from cup with straw, otherwise dependent     Vision Ability to See in Adequate Light: 0 Adequate Additional Comments: vision appears grossly intact     Perception         Praxis         Pertinent Vitals/Pain Pain Assessment Pain Assessment: No/denies pain (pain with attempt measure BP)     Extremity/Trunk Assessment Upper Extremity Assessment Upper Extremity Assessment: Overall WFL for  tasks assessed   Lower Extremity Assessment Lower Extremity Assessment: Defer to PT evaluation   Cervical / Trunk Assessment Cervical / Trunk Assessment: Other exceptions (weakness)   Communication Communication Communication: No apparent difficulties   Cognition Arousal: Alert Behavior During Therapy: Agitated, Flat affect, Impulsive, Restless Overall Cognitive Status: Impaired/Different from baseline Area of Impairment: Attention, Following commands, Safety/judgement, Problem solving                   Current Attention Level: Focused   Following Commands: Follows one step commands  inconsistently Safety/Judgement: Decreased awareness of safety, Decreased awareness of deficits   Problem Solving: Decreased initiation, Difficulty sequencing, Requires verbal cues, Requires tactile cues, Slow processing General Comments: pt yelling out throughout session and stating, " I need to get out of here." Pt calling for "Kathlene November," but unable to state who Kathlene November was.     General Comments       Exercises     Shoulder Instructions      Home Living Family/patient expects to be discharged to:: Skilled nursing facility                                 Additional Comments: from [facility] SNF      Prior Functioning/Environment Prior Level of Function : Needs assist             Mobility Comments: reports w/c dependence, does not have a prosthesis ADLs Comments: Likely self feeding and dependent in bathing, dressing and toileting.        OT Problem List: Decreased strength;Impaired balance (sitting and/or standing);Decreased cognition;Decreased safety awareness      OT Treatment/Interventions: Self-care/ADL training;Cognitive remediation/compensation;Therapeutic activities;Patient/family education;Balance training    OT Goals(Current goals can be found in the care plan section) Acute Rehab OT Goals OT Goal Formulation: Patient unable to participate in goal setting Time For Goal Achievement: 07/31/23 Potential to Achieve Goals: Good  OT Frequency: Min 1X/week    Co-evaluation PT/OT/SLP Co-Evaluation/Treatment: Yes Reason for Co-Treatment: Necessary to address cognition/behavior during functional activity;For patient/therapist safety   OT goals addressed during session: Strengthening/ROM      AM-PAC OT "6 Clicks" Daily Activity     Outcome Measure Help from another person eating meals?: A Lot Help from another person taking care of personal grooming?: Total Help from another person toileting, which includes using toliet, bedpan, or urinal?: Total Help  from another person bathing (including washing, rinsing, drying)?: Total Help from another person to put on and taking off regular upper body clothing?: Total Help from another person to put on and taking off regular lower body clothing?: Total 6 Click Score: 7   End of Session Nurse Communication: Mobility status  Activity Tolerance: Patient tolerated treatment well Patient left: in bed;with call bell/phone within reach;with bed alarm set  OT Visit Diagnosis: Muscle weakness (generalized) (M62.81);Other symptoms and signs involving cognitive function                Time: [id number] OT Time Calculation (min): 14 min Charges:  OT General Charges $OT Visit: 1 Visit OT Evaluation $OT Eval Moderate Complexity: 1 Mod  Berna Spare, OTR/L Acute Rehabilitation Services Office: [phone]   Evern Bio 07/17/2023, 12:18 PM

## 2023-07-17 NOTE — Progress Notes (Signed)
Shortly after treatment started patient pulled mask off and refused to place back on. RN aware.

## 2023-07-17 NOTE — TOC Progression Note (Signed)
Transition of Care [facility]) - Progression Note    Patient Details  Name: MUHAMAD SERANO MRN: [id number] Date of Birth: 1960/10/02  Transition of Care [facility]) CM/SW Contact  Erin Sons, [place] Phone Number: 07/17/2023, 1:08 PM  Clinical Narrative:        Spoke with [name] liaison; pt is a LTC resident there.        Social Determinants of Health (SDOH) Interventions SDOH Screenings   Food Insecurity: No Food Insecurity (06/30/2023)  Housing: Low Risk  (06/30/2023)  Transportation Needs: No Transportation Needs (06/30/2023)  Utilities: Not At Risk (06/30/2023)  Depression (PHQ2-9): Low Risk  (03/08/2021)  Tobacco Use: High Risk (06/30/2023)    Readmission Risk Interventions    09/06/2022    1:48 PM 08/27/2022   12:46 PM 05/03/2022   11:53 AM  Readmission Risk Prevention Plan  Transportation Screening Complete Complete Complete  Medication Review Oceanographer) Complete Complete Complete  PCP or Specialist appointment within 3-5 days of discharge Complete Complete Complete  HRI or Home Care Consult Complete Complete Complete  SW Recovery Care/Counseling Consult Complete Complete Complete  Palliative Care Screening Not Applicable Not Applicable Not Applicable  Skilled Nursing Facility Complete Complete Complete

## 2023-07-17 NOTE — H&P (Deleted)
NAME:  JILL RUPPE, MRN:  [id number], DOB:  02/02/1961, LOS: 1 ADMISSION DATE:  07/15/2023, CONSULTATION DATE:  1/28 REFERRING MD:  Dr. Pilar Plate, CHIEF COMPLAINT:  Agitation Delirium   History of Present Illness:  Patient is a 63 year old male with pertinent PMH DMT2, HTN, HLD, CKD 3B, COPD, chronic osteomyelitis s/p R AKA, L midfoot amputation, stroke, A-fib, HFpEF, mesenteric ischemia s/p colostomy 12/2022 presents to [facility] ED on 1/27 with agitation delirium.  Patient recently admitted on 1/9-1/23 with septic shock and AMS secondary to ESBL UTI and Serratia/Klebsiella multifocal pneumonia/positive rhinovirus.  Had acute respiratory failure requiring intubation.  Patient is MRI also showing acute on chronic right PCA infarct.  On discharge patient fully alert and mostly oriented and was discharged to [facility] on Lyrica 125 mg 3 times daily and Seroquel 100 Mg nightly.  On 1/27 patient was throwing himself on the floor at [facility].  More confused and was brought to [facility] ED for further eval.  On arrival patient alert.  Vital stable and afebrile.  UA clear.  CXR improved opacities from prior cxr 1/18. WBC 11.1 and LA 0.6 glucose 104.  Ammonia and VBG unremarkable.  CT head with no evidence of acute abnormality.  Patient agitated requiring multiple medications oxy, Ativan, Versed, morphine.  Despite medications patient required Precedex.  PCCM consulted for ICU admission  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)    Acquired absence of right leg below knee (HCC)    Acute osteomyelitis of calcaneum, right (HCC) 10/02/2016   Acute respiratory failure (HCC)    Amputation stump infection (HCC) 08/12/2018   Anemia    Basal cell carcinoma, eyelid    Cancer (HCC)    Candidiasis 08/12/2018   CHF (congestive heart failure) (HCC)    chronic diastolic    Chronic back pain    Chronic kidney disease    stage 3    Chronic multifocal  osteomyelitis (HCC)    of ankle and foot    Chronic pain syndrome    COPD (chronic obstructive pulmonary disease) (HCC)    DDD (degenerative disc disease), lumbar    Depression    major depressive disorder    Diabetes mellitus without complication (HCC)    type 2    Diabetic ulcer of toe of left foot (HCC) 10/02/2016   Difficult intubation    Difficulty in walking, not elsewhere classified    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   Foot drop, bilateral    Foot drop, right 12/27/2015   GERD (gastroesophageal reflux disease)    Herpesviral vesicular dermatitis    History of COVID-19 03/15/2022   Hyperlipidemia    Hyperlipidemia    Hypertension    Insomnia    Malignant melanoma of other parts of face (HCC)    Melanoma (HCC)    MRSA bacteremia    Muscle weakness (generalized)    Nasal congestion    Necrosis of toe (HCC) 07/08/2018   Neuromuscular dysfunction of bladder    Osteomyelitis (HCC)    Osteomyelitis (HCC)    Osteomyelitis (HCC)    right BKA   Other idiopathic peripheral autonomic neuropathy    Retention of urine, unspecified    Squamous cell carcinoma of skin    Stroke (HCC)    Unsteadiness on feet    Urinary retention    Urinary retention    Urinary tract infection    Wears dentures  Wears glasses      Significant Hospital Events: Including procedures, antibiotic start and stop dates in addition to other pertinent events   1/28 agitation delirium requiring Precedex; PCCM consulted  Interim History / Subjective:  Remains delirious, agitated. Endorses hallucinations. Seroquel helped overnight.  Objective   Blood pressure (!) 94/55, pulse 67, temperature 98.9 F (37.2 C), temperature source Oral, resp. rate 15, height 6' (1.829 m), weight 81.7 kg, SpO2 94%.        Intake/Output Summary (Last 24 hours) at 07/17/2023 0826 Last data filed at 07/17/2023 0700 Gross per 24 hour  Intake 2034.19 ml  Output 1420 ml  Net 614.19 ml   Filed Weights   07/16/23 1610  07/16/23 0930  Weight: 80.6 kg 81.7 kg    Examination: General:   NAD HEENT: MM pink/moist Neuro: Confused patient but alert, able to state name; mae; perrl CV: s1s2, RRR, no m/r/g PULM:  dim clear BS bilaterally; Room air GI: soft, bsx4 active  Extremities: warm/dry, R AKA; L toe amputation and skin tear on leg   Resolved Hospital Problem list     Assessment & Plan:   Acute encephalopathy/agitation delirium:  of unknown cause; had agitation delirium on recent admission requiring seroquel and acute on chronic PCA stroke; is on multiple psych meds at home facility. -CT head no acute abnormality; ammonia and UA WNL Plan: -Stop precedex not real solution to delirium -Increase seroquel to BID, PRN Haldol added  COPD Plan: -Brovana, Pulmicort, Yupelri -As needed DuoNeb  Mild leukocytosis Plan: -Afebrile and wbc slightly lower than 9 days ago -will hold on initiating antibiotics for now -PCT negative, no source of infection identified  Chronic right PCA infarct Plan: -ASA and statin   HFrEF HTN HLD Plan: -Coreg, hydralazine PO resumed.  T2DM Plan: -CBG monitoring  CKD 3B Plan: -Trend BMP / urinary output -Replace electrolytes as indicated -Avoid nephrotoxic agents, ensure adequate renal perfusion  Chronic pain Plan: -Low dose oxy scheduled in case pain leading to delirium  Depression/anxiety Plan: -citalopram   Best Practice (right click and "Reselect all SmartList Selections" daily)   Diet/type: Regular consistency (see orders) DVT prophylaxis prophylactic heparin  Pressure ulcer(s): present on admission  GI prophylaxis: H2B Lines: N/A Foley:  N/A Code Status:  full code Last date of multidisciplinary goals of care discussion [pending]  Labs   CBC: Recent Labs  Lab 07/15/23 1622 07/15/23 2022 07/15/23 2037 07/16/23 0545 07/16/23 0807  WBC  --  11.1*  --  6.0  --   NEUTROABS  --  6.5  --   --   --   HGB 12.9* 11.4* 11.6* 10.4* 10.5*   HCT 38.0* 35.8* 34.0* 32.5* 31.0*  MCV  --  89.9  --  91.5  --   PLT  --  297  --  228  --     Basic Metabolic Panel: Recent Labs  Lab 07/15/23 1622 07/15/23 2022 07/15/23 2037 07/16/23 0545 07/16/23 0807  NA 141 135 140 137 142  K 4.0 3.8 4.0 3.8 3.9  CL 109 108  --  110  --   CO2  --  18*  --  17*  --   GLUCOSE 148* 124*  --  137*  --   BUN 52* 54*  --  47*  --   CREATININE 2.00* 1.75*  --  1.56*  --   CALCIUM  --  9.8  --  9.5  --   MG  --   --   --  1.7  --    GFR: Estimated Creatinine Clearance: 52.5 mL/min (A) (by C-G formula based on SCr of 1.56 mg/dL (H)). Recent Labs  Lab 07/15/23 2022 07/15/23 2037 07/16/23 0545  PROCALCITON  --   --  <0.10  WBC 11.1*  --  6.0  LATICACIDVEN  --  0.6  --     Liver Function Tests: Recent Labs  Lab 07/15/23 2022  AST 25  ALT 21  ALKPHOS 99  BILITOT 0.7  PROT 7.5  ALBUMIN 3.4*   No results for input(s): "LIPASE", "AMYLASE" in the last 168 hours. Recent Labs  Lab 07/15/23 2022  AMMONIA 18    ABG    Component Value Date/Time   PHART 7.358 07/16/2023 0807   PCO2ART 32.8 07/16/2023 0807   PO2ART 67 (L) 07/16/2023 0807   HCO3 18.5 (L) 07/16/2023 0807   TCO2 20 (L) 07/16/2023 0807   ACIDBASEDEF 6.0 (H) 07/16/2023 0807   O2SAT 93 07/16/2023 0807     Coagulation Profile: No results for input(s): "INR", "PROTIME" in the last 168 hours.  Cardiac Enzymes: No results for input(s): "CKTOTAL", "CKMB", "CKMBINDEX", "TROPONINI" in the last 168 hours.  HbA1C: Hemoglobin A1C  Date/Time Value Ref Range Status  08/14/2016 12:00 AM 5.6  Final  01/18/2016 12:00 AM 5.5  Final   Hgb A1c MFr Bld  Date/Time Value Ref Range Status  06/27/2023 09:25 AM 6.4 (H) 4.8 - 5.6 % Final    Comment:    (NOTE) Pre diabetes:          5.7%-6.4%  Diabetes:              >6.4%  Glycemic control for   <7.0% adults with diabetes   12/03/2022 04:54 PM 6.9 (H) 4.8 - 5.6 % Final    Comment:    (NOTE) Pre diabetes:           5.7%-6.4%  Diabetes:              >6.4%  Glycemic control for   <7.0% adults with diabetes     CBG: Recent Labs  Lab 07/16/23 1532 07/16/23 1953 07/16/23 2358 07/17/23 0408 07/17/23 0753  GLUCAP 140* 142* 130* 130* 117*    Review of Systems:   Patient is encephalopathic; therefore, history has been obtained from chart review.    Past Medical History:  He,  has a past medical history of Acquired absence of left great toe (HCC), Acquired absence of right leg below knee [facility]), Acute osteomyelitis of calcaneum, right (HCC) (10/02/2016), Acute respiratory failure (HCC), Amputation stump infection (HCC) (08/12/2018), Anemia, Basal cell carcinoma, eyelid, Cancer (HCC), Candidiasis (08/12/2018), CHF (congestive heart failure) (HCC), Chronic back pain, Chronic kidney disease, Chronic multifocal osteomyelitis (HCC), Chronic pain syndrome, COPD (chronic obstructive pulmonary disease) (HCC), DDD (degenerative disc disease), lumbar, Depression, Diabetes mellitus without complication (HCC), Diabetic ulcer of toe of left foot (HCC) (10/02/2016), Difficult intubation, Difficulty in walking, not elsewhere classified, 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), Foot drop, bilateral, Foot drop, right (12/27/2015), GERD (gastroesophageal reflux disease), Herpesviral vesicular dermatitis, History of COVID-19 (03/15/2022), Hyperlipidemia, Hyperlipidemia, Hypertension, Insomnia, Malignant melanoma of other parts of face (HCC), Melanoma (HCC), MRSA bacteremia, Muscle weakness (generalized), Nasal congestion, Necrosis of toe (HCC) (07/08/2018), Neuromuscular dysfunction of bladder, Osteomyelitis (HCC), Osteomyelitis (HCC), Osteomyelitis (HCC), Other idiopathic peripheral autonomic neuropathy, Retention of urine, unspecified, Squamous cell carcinoma of skin, Stroke (HCC), Unsteadiness on feet, Urinary retention, Urinary retention, Urinary tract infection, Wears dentures, and Wears glasses.   Surgical History:   Past Surgical  History:  Procedure Laterality Date   AMPUTATION Left 03/29/2017   Procedure: LEFT GREAT TOE AMPUTATION AT METATARSOPHALANGEAL JOINT;  Surgeon: Nadara Mustard, MD;  Location: [facility] OR;  Service: Orthopedics;  Laterality: Left;   AMPUTATION Right 03/29/2017   Procedure: RIGHT BELOW KNEE AMPUTATION;  Surgeon: Nadara Mustard, MD;  Location: [facility] OR;  Service: Orthopedics;  Laterality: Right;   AMPUTATION Left 06/06/2018   Procedure: LEFT 2ND TOE AMPUTATION;  Surgeon: Nadara Mustard, MD;  Location: [facility] OR;  Service: Orthopedics;  Laterality: Left;   AMPUTATION Right 11/19/2018   Procedure: AMPUTATION ABOVE KNEE;  Surgeon: Nadara Mustard, MD;  Location: [facility] OR;  Service: Orthopedics;  Laterality: Right;   ANTERIOR CERVICAL CORPECTOMY N/A 11/25/2015   Procedure: ANTERIOR CERVICAL FIVE CORPECTOMY Cervical four - six fusion;  Surgeon: Lisbeth Renshaw, MD;  Location: [facility] OR[facility];  Service: Neurosurgery;  Laterality: N/A;  ANTERIOR CERVICAL FIVE CORPECTOMY Cervical four - six fusion   APPLICATION OF WOUND VAC Right 10/22/2018   Procedure: Application Of Wound Vac;  Surgeon: Nadara Mustard, MD;  Location: [facility] OR;  Service: Orthopedics;  Laterality: Right;   CYSTOSCOPY WITH BIOPSY N/A 05/01/2022   Procedure: CYSTOSCOPY WITH URETHRAL BIOPSY;  Surgeon: Noel Christmas, MD;  Location: [facility] ORS;  Service: Urology;  Laterality: N/A;  1 HR   CYSTOSCOPY WITH RETROGRADE URETHROGRAM N/A 04/25/2018   Procedure: CYSTOSCOPY WITH RETROGRADE URETHROGRAM/ BALLOON DILATION;  Surgeon: Ihor Gully, MD;  Location: [facility] ORS;  Service: Urology;  Laterality: N/A;   CYSTOSCOPY WITH URETHRAL DILATATION N/A 05/01/2022   Procedure: BALLOON DILATION WITH  OPTILUME;  Surgeon: Noel Christmas, MD;  Location: [facility] ORS;  Service: Urology;  Laterality: N/A;   LAPAROTOMY N/A 12/03/2022   Procedure: EXPLORATORY LAPAROTOMY  total abdominal colectomy with end ileostomy;  Surgeon: Kinsinger, De Blanch, MD;  Location: [facility] OR;  Service: General;  Laterality:  N/A;   MULTIPLE TOOTH EXTRACTIONS     POSTERIOR CERVICAL FUSION/FORAMINOTOMY N/A 11/29/2015   Procedure: Cervical Three-Cervical Seven Posterior Cervical Laminectomy with Fusion;  Surgeon: Lisbeth Renshaw, MD;  Location: [facility];  Service: Neurosurgery;  Laterality: N/A;  Cervical Three-Cervical Seven Posterior Cervical Laminectomy with Fusion   STUMP REVISION Right 10/22/2018   Procedure: REVISION RIGHT BELOW KNEE AMPUTATION;  Surgeon: Nadara Mustard, MD;  Location: [facility] OR;  Service: Orthopedics;  Laterality: Right;   STUMP REVISION Right 12/05/2018   Procedure: Revision Right Above Knee Amputation;  Surgeon: Nadara Mustard, MD;  Location: [facility] OR;  Service: Orthopedics;  Laterality: Right;   STUMP REVISION Right 05/29/2019   Procedure: REVISION RIGHT ABOVE KNEE AMPUTATION;  Surgeon: Nadara Mustard, MD;  Location: [facility] OR;  Service: Orthopedics;  Laterality: Right;   STUMP REVISION Right 06/24/2019   Procedure: STUMP REVISION;  Surgeon: Nadara Mustard, MD;  Location: [facility] OR;  Service: Orthopedics;  Laterality: Right;     Social History:   reports that he has been smoking cigarettes. He has never used smokeless tobacco. He reports that he does not drink alcohol and does not use drugs.   Family History:  His family history includes Bone cancer in his father; Cancer in his father, mother, paternal grandfather, and another family member; Diabetes in his mother and another family member; Heart disease in his mother; Prostate cancer in his father.   Allergies Allergies  Allergen Reactions   Latex Other (See Comments)    "ALLERGIC," per [facility]     Home Medications  Prior to Admission medications  Medication Sig Start Date End Date Taking? Authorizing Provider  acetaminophen (TYLENOL) 500 MG tablet Take 1,000 mg by mouth in the morning and at bedtime.    [provider]  aspirin EC 81 MG tablet Take 81 mg by mouth daily. Swallow whole.    [provider]  atorvastatin (LIPITOR) 80  MG tablet Take 1 tablet (80 mg total) by mouth daily. Patient taking differently: Take 80 mg by mouth at bedtime. 09/22/22 01/27/24  Jerald Kief, MD  carvedilol (COREG) 12.5 MG tablet Take 1 tablet (12.5 mg total) by mouth 2 (two) times daily with a meal. 12/25/22   Dorcas Carrow, MD  Cholecalciferol 25 MCG (1000 UT) capsule Take 2,000 Units by mouth daily.    [provider]  Cranberry 450 MG CAPS Take 450 mg by mouth daily.    [provider]  Dextran 70-Hypromellose (NATURAL BALANCE TEARS) 0.1-0.3 % SOLN Place 1 drop into both eyes every 4 (four) hours as needed (dry eyes).    [provider]  docusate (COLACE) 50 MG/5ML liquid Take 10 mLs (100 mg total) by mouth 2 (two) times daily as needed for mild constipation. 07/11/23   Jerald Kief, MD  escitalopram (LEXAPRO) 20 MG tablet Take 20 mg by mouth daily.    [provider]  famotidine (PEPCID) 20 MG tablet Take 1 tablet (20 mg total) by mouth daily. 07/12/23 08/11/23  Jerald Kief, MD  ferrous sulfate 325 (65 FE) MG tablet Take 1 tablet (325 mg total) by mouth 3 (three) times daily with meals. Patient taking differently: Take 325 mg by mouth daily. With meal. 01/23/23   Cathren Laine, MD  fluticasone (FLONASE) 50 MCG/ACT nasal spray Place 1 spray into both nostrils daily.    [provider]  furosemide (LASIX) 20 MG tablet Take 20-40 mg by mouth See admin instructions. Take 40 mg by mouth in the morning and 20 mg by mouth in the afternoon    [provider]  haloperidol lactate (HALDOL) 5 MG/ML injection Inject 1 mg into the muscle every 6 (six) hours as needed (agitation/psychosis).    [provider]  hydrALAZINE (APRESOLINE) 10 MG tablet Take 1 tablet (10 mg total) by mouth every 8 (eight) hours. 12/25/22   Dorcas Carrow, MD  hydrOXYzine (VISTARIL) 50 MG capsule Take 50 mg by mouth at bedtime.    [provider]  insulin glargine (LANTUS SOLOSTAR) 100 UNIT/ML Solostar Pen  Inject 15 Units into the skin daily. Patient taking differently: Inject 15 Units into the skin at bedtime. 09/07/22   Rai, Ripudeep K, MD  insulin lispro (HUMALOG) 100 UNIT/ML KwikPen Inject 0-10 Units into the skin See admin instructions. Inject 0-10 units into the skin three times a day and at bedtime, PER SLIDING SCALE:  BGL 151-200 = 2 units 201-250 = 4 units 251-300 = 6 units 301-350 =  8 units 351-400 = 10 units Patient taking differently: Inject 0-10 Units into the skin See admin instructions. Inject 0-10 units into the skin before meals and at bedtime, PER SLIDING SCALE:  0-150 = 0 units 151-200 = 2 units 201-250 = 4 units 251-300 = 6 units 301-350 =  8 units 351-400 = 10 units 01/31/22   Dahal, Melina Schools, MD  ipratropium-albuterol (DUONEB) 0.5-2.5 (3) MG/3ML SOLN Take 3 mLs by nebulization every 6 (six) hours as needed (for shortness of breath).    [provider]  LACTOBACILLUS PO Take 1 capsule by mouth in the morning and at  bedtime.    [provider]  lidocaine (LIDODERM) 5 % Place 2 patches onto the skin daily. Remove & Discard patch within 12 hours or as directed by MD Patient taking differently: Place 1 patch onto the skin in the morning and at bedtime. Take off after 12 hours 12/25/22   Dorcas Carrow, MD  loperamide (IMODIUM) 2 MG capsule Take 1 capsule (2 mg total) by mouth 2 (two) times daily. 12/25/22   Dorcas Carrow, MD  loratadine (CLARITIN) 10 MG tablet Take 10 mg by mouth in the morning.    [provider]  magnesium oxide (MAG-OX) 400 (240 Mg) MG tablet Take 400 mg by mouth in the morning, at noon, and at bedtime.    [provider]  Melatonin 10 MG TABS Take 10 mg by mouth at bedtime.    [provider]  Meth-Hyo-M Bl-Na Phos-Ph Sal (URO-MP) 118 MG CAPS Take 118 mg by mouth 2 (two) times daily.    [provider]  Multiple Vitamin (MULTIVITAMIN WITH MINERALS) TABS tablet Take 1 tablet by mouth daily. 12/25/22   Dorcas Carrow, MD  omeprazole (PRILOSEC) 20 MG capsule Take 20 mg by mouth at bedtime.    [provider]  ondansetron (ZOFRAN) 4 MG tablet Take 4 mg by mouth every 8 (eight) hours as needed for nausea or vomiting.    [provider]  oxybutynin (DITROPAN-XL) 5 MG 24 hr tablet Take 5 mg by mouth in the morning and at bedtime.    [provider]  oxyCODONE (OXY IR/ROXICODONE) 5 MG immediate release tablet Take 1 tablet (5 mg total) by mouth every 6 (six) hours as needed for moderate pain (pain score 4-6). 07/11/23   Jerald Kief, MD  oxyCODONE (OXYCONTIN) 15 mg 12 hr tablet Take 1 tablet (15 mg total) by mouth in the morning, at noon, and at bedtime. 07/11/23   Jerald Kief, MD  OXYGEN Inhale 2 L into the lungs as needed (to maintain sats above 90/SOB).    [provider]  oxymetazoline (AFRIN) 0.05 % nasal spray Place 1 spray into both nostrils 2 (two) times daily as needed for congestion.    [provider]  pregabalin (LYRICA) 100 MG capsule Take 1 capsule (100 mg total) by mouth 2 (two) times daily. Patient taking differently: Take 100 mg by mouth 3 (three) times daily. 12/25/22 06/27/23  Dorcas Carrow, MD  pregabalin (LYRICA) 25 MG capsule Take 25 mg by mouth 3 (three) times daily.    [provider]  QUEtiapine (SEROQUEL) 100 MG tablet Take 100 mg by mouth at bedtime.    [provider]  senna (SENOKOT) 8.6 MG TABS tablet Take 2 tablets by mouth at bedtime.    [provider]  sodium bicarbonate 650 MG tablet Take 650 mg by mouth 2 (two) times daily.    [provider]  tamsulosin (FLOMAX) 0.4 MG CAPS capsule Take 2 capsules (0.8 mg total) by mouth daily. Patient taking differently: Take 0.4 mg by mouth daily. 02/09/23   Peterson Ao, MD  tirzepatide [facility]) 2.5 MG/0.5ML Pen Inject 2.5 mg into the skin once a week. On Tuesdays    [provider]  trolamine salicylate (ASPERCREME) 10 % cream Apply 1  Application topically in the morning, at noon, in the evening, and at bedtime. Right leg    [provider]  UNABLE TO FIND Take 30 mLs by mouth in the morning and at bedtime. Med Name: liquid protein  [provider]     Critical care time:    CRITICAL CARE Performed by: Karren Burly   Total critical care time: 32 minutes  Critical care time was exclusive of separately billable procedures and treating other patients.  Critical care was necessary to treat or prevent imminent or life-threatening deterioration.  Critical care was time spent personally by me on the following activities: development of treatment plan with patient and/or surrogate as well as nursing, discussions with consultants, evaluation of patient's response to treatment, examination of patient, obtaining history from patient or surrogate, ordering and performing treatments and interventions, ordering and review of laboratory studies, ordering and review of radiographic studies, pulse oximetry and re-evaluation of patient's condition.   Karren Burly, MD [facility] 07/17/2023, 8:26 AM  Please see [email or web address] for pager details. From 7A-7P if no response, please call [phone]. After hours, please call ELink [phone].

## 2023-07-17 NOTE — Evaluation (Signed)
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  Clinical Impression  Pt admitted with above diagnosis. At baseline, pt is resident and [facility].  Pt agitated, confused and unable to provide hx.  At last admission, reports he was working on transfers at [facility].  Today, pt limited by confusion and agitation.  Required mod A x 2 to tx to EOB then Mod A at EOB with brief episodes of CGA.  Pt did report some lightheadedness - suspected potential orthostatic hypotension but pt becoming very agitated with attempts at getting BP.  Pt with guarded rehab potential - will place lower frequency at this time due to limited ability to participate (can increase if needed).  Pt currently with functional limitations due to the deficits listed below (see PT Problem List). Pt will benefit from acute skilled PT to increase their independence and safety with mobility to allow discharge. Pt from [facility] - expect return at d/c.          If plan is discharge home, recommend the following: Two people to help with walking and/or transfers;Assistance with cooking/housework;Direct supervision/assist for medications management;Direct supervision/assist for financial management;Assist for transportation;Supervision due to cognitive status;Two people to help with bathing/dressing/bathroom   Can travel by private vehicle   No    Equipment Recommendations None recommended by PT  Recommendations for Other Services       Functional Status Assessment Patient has had a recent decline in their functional status and demonstrates the ability  to make significant improvements in function in a reasonable and predictable amount of time.     Precautions / Restrictions Precautions Precautions: Fall Required Braces or Orthoses:  (no prosthesis) Restrictions Weight Bearing Restrictions Per Provider Order: No Other Position/Activity Restrictions: No weight bearing restricitions per orders or prior PT notes      Mobility  Bed Mobility Overal bed mobility: Needs Assistance Bed Mobility: Rolling, Sidelying to Sit, Sit to Sidelying Rolling: Supervision Sidelying to sit: +2 for physical assistance, Mod assist     Sit to sidelying: Mod assist, +2 for physical assistance General bed mobility comments: rolling with bed rails with supervision for lines, assist for R LE over EOB and to raise trunk into sitting, guided trunk to sidelying and assist for R LE back into bed    Transfers                   General transfer comment: deferred    Ambulation/Gait                  Stairs            Wheelchair Mobility     Tilt Bed    Modified Rankin (Stroke Patients Only)       Balance Overall balance assessment: Needs assistance Sitting-balance support: No upper extremity supported, Feet supported Sitting balance-Leahy Scale: Poor Sitting balance - Comments: Mod to CGA briefly at EOB       Standing balance comment: deferred  Pertinent Vitals/Pain Pain Assessment Pain Assessment: Faces (pain with attempted BP measurement) Faces Pain Scale: Hurts little more    Home Living Family/patient expects to be discharged to:: Skilled nursing facility                   Additional Comments: from [facility]    Prior Function Prior Level of Function : Needs assist;Patient poor historian/Family not available             Mobility Comments: reports w/c dependence, does not have a prosthesis; per last note was working on sliding transfers at [facility]  Comments: Likely self feeding and dependent in bathing, dressing and toileting.     Extremity/Trunk Assessment   Upper Extremity Assessment Upper Extremity Assessment: Defer to OT evaluation    Lower Extremity Assessment Lower Extremity Assessment: LLE deficits/detail;RLE deficits/detail;Difficult to assess due to impaired cognition RLE Deficits / Details: R AKA LLE Deficits / Details: L LE with wounds, not following commands for ROM or MMT; is moving spontaneously    Cervical / Trunk Assessment Cervical / Trunk Assessment: Other exceptions (weakness)  Communication   Communication Communication: No apparent difficulties  Cognition Arousal: Alert Behavior During Therapy: Agitated, Flat affect, Impulsive, Restless Overall Cognitive Status: Impaired/Different from baseline Area of Impairment: Attention, Following commands, Safety/judgement, Problem solving                   Current Attention Level: Focused   Following Commands: Follows one step commands inconsistently Safety/Judgement: Decreased awareness of safety, Decreased awareness of deficits   Problem Solving: Decreased initiation, Difficulty sequencing, Requires verbal cues, Requires tactile cues, Slow processing General Comments: pt yelling out throughout session and stating, " I need to get out of here." Pt calling for "Kathlene November," but unable to state who Kathlene November was.  Easily agitated.  Very agitated with BP cuff and cursing.  Required hands closely monitored and held majority of session to prevent pulling lines        General Comments General comments (skin integrity, edema, etc.): Pt sat EOB for 3-5 mins with CGA-mod A.  [name] agitation level varying (increased with attempts to get BP).  Noting BP earlier 83/68 (72). Pt reports some dizziness at EOB and laying his head on OT arm at times.  Unable to get EOB BP.  Returned to supine    Exercises     Assessment/Plan    PT Assessment    PT Problem List         PT  Treatment Interventions      PT Goals (Current goals can be found in the Care Plan section)  Acute Rehab PT Goals Patient Stated Goal: unable to state PT Goal Formulation: Patient unable to participate in goal setting Time For Goal Achievement: 07/31/23 Potential to Achieve Goals: Poor    Frequency Min 1X/week     Co-evaluation PT/OT/SLP Co-Evaluation/Treatment: Yes Reason for Co-Treatment: Necessary to address cognition/behavior during functional activity;For patient/therapist safety   OT goals addressed during session: Strengthening/ROM       AM-PAC PT "6 Clicks" Mobility  Outcome Measure Help needed turning from your back to your side while in a flat bed without using bedrails?: A Little Help needed moving from lying on your back to sitting on the side of a flat bed without using bedrails?: Total Help needed moving to and from a bed to a chair (including a wheelchair)?: Total Help needed standing up from a chair using your arms (e.g., wheelchair or bedside chair)?:  Total Help needed to walk in hospital room?: Total Help needed climbing 3-5 steps with a railing? : Total 6 Click Score: 8    End of Session Equipment Utilized During Treatment: Gait belt Activity Tolerance: Treatment limited secondary to agitation Patient left: in bed;with call bell/phone within reach;with bed alarm set;with restraints reapplied (mitts applied) Nurse Communication: Mobility status PT Visit Diagnosis: Muscle weakness (generalized) (M62.81);Difficulty in walking, not elsewhere classified (R26.2);Other symptoms and signs involving the nervous system (R29.898) (c/o dizzy - unable to get BP)    Time: 2130-8657 PT Time Calculation (min) (ACUTE ONLY): 14 min   Charges:   PT Evaluation $PT Eval Low Complexity: 1 Low   PT General Charges $$ ACUTE PT VISIT: 1 Visit         Anise Salvo, PT Acute Rehab [facility] [phone]   Rayetta Humphrey 07/17/2023, 12:28 PM

## 2023-07-18 DIAGNOSIS — G9340 Encephalopathy, unspecified: Secondary | ICD-10-CM | POA: Diagnosis not present

## 2023-07-18 LAB — BASIC METABOLIC PANEL
Anion gap: 11 (ref 5–15)
BUN: 40 mg/dL — ABNORMAL HIGH (ref 8–23)
CO2: 16 mmol/L — ABNORMAL LOW (ref 22–32)
Calcium: 8.9 mg/dL (ref 8.9–10.3)
Chloride: 110 mmol/L (ref 98–111)
Creatinine, Ser: 2.04 mg/dL — ABNORMAL HIGH (ref 0.61–1.24)
GFR, Estimated: 36 mL/min — ABNORMAL LOW (ref >60)
Glucose, Bld: 124 mg/dL — ABNORMAL HIGH (ref 70–99)
Potassium: 3.5 mmol/L (ref 3.5–5.1)
Sodium: 137 mmol/L (ref 135–145)

## 2023-07-18 LAB — CBC WITH DIFFERENTIAL/PLATELET
Abs Immature Granulocytes: 0.03 10*3/uL (ref 0.00–0.07)
Basophils Absolute: 0 10*3/uL (ref 0.0–0.1)
Basophils Relative: 1 %
Eosinophils Absolute: 0.5 10*3/uL (ref 0.0–0.5)
Eosinophils Relative: 13 %
HCT: 29 % — ABNORMAL LOW (ref 39.0–52.0)
Hemoglobin: 9.5 g/dL — ABNORMAL LOW (ref 13.0–17.0)
Immature Granulocytes: 1 %
Lymphocytes Relative: 28 %
Lymphs Abs: 1 10*3/uL (ref 0.7–4.0)
MCH: 29.1 pg (ref 26.0–34.0)
MCHC: 32.8 g/dL (ref 30.0–36.0)
MCV: 89 fL (ref 80.0–100.0)
Monocytes Absolute: 0.3 10*3/uL (ref 0.1–1.0)
Monocytes Relative: 8 %
Neutro Abs: 1.8 10*3/uL (ref 1.7–7.7)
Neutrophils Relative %: 49 %
Platelets: 137 10*3/uL — ABNORMAL LOW (ref 150–400)
RBC: 3.26 MIL/uL — ABNORMAL LOW (ref 4.22–5.81)
RDW: 16.7 % — ABNORMAL HIGH (ref 11.5–15.5)
WBC: 3.6 10*3/uL — ABNORMAL LOW (ref 4.0–10.5)
nRBC: 0 % (ref 0.0–0.2)

## 2023-07-18 LAB — GLUCOSE, CAPILLARY
Glucose-Capillary: 102 mg/dL — ABNORMAL HIGH (ref 70–99)
Glucose-Capillary: 113 mg/dL — ABNORMAL HIGH (ref 70–99)
Glucose-Capillary: 123 mg/dL — ABNORMAL HIGH (ref 70–99)
Glucose-Capillary: 124 mg/dL — ABNORMAL HIGH (ref 70–99)
Glucose-Capillary: 126 mg/dL — ABNORMAL HIGH (ref 70–99)
Glucose-Capillary: 161 mg/dL — ABNORMAL HIGH (ref 70–99)

## 2023-07-18 LAB — MAGNESIUM: Magnesium: 1.8 mg/dL (ref 1.7–2.4)

## 2023-07-18 MED ORDER — OXYCODONE HCL 5 MG PO TABS: 10.0000 mg | ORAL_TABLET | Freq: Three times a day (TID) | ORAL | Status: DC

## 2023-07-18 MED ORDER — ASPIRIN 81 MG PO TBEC
81.0000 mg | DELAYED_RELEASE_TABLET | Freq: Every day | ORAL | Status: DC
  Administered 2023-07-18 – 2023-08-18 (×29): 81 mg via ORAL
  Filled 2023-07-18 (×32): qty 1

## 2023-07-18 MED ORDER — OXYCODONE HCL 5 MG PO TABS
10.0000 mg | ORAL_TABLET | Freq: Four times a day (QID) | ORAL | Status: DC
  Administered 2023-07-18 – 2023-07-20 (×7): 10 mg via ORAL
  Filled 2023-07-18 (×7): qty 2

## 2023-07-18 MED ORDER — SODIUM BICARBONATE 650 MG PO TABS
650.0000 mg | ORAL_TABLET | Freq: Two times a day (BID) | ORAL | Status: DC
  Administered 2023-07-18 – 2023-07-31 (×26): 650 mg via ORAL
  Filled 2023-07-18 (×28): qty 1

## 2023-07-18 MED ORDER — POTASSIUM CHLORIDE CRYS ER 20 MEQ PO TBCR
40.0000 meq | EXTENDED_RELEASE_TABLET | Freq: Once | ORAL | Status: AC
  Administered 2023-07-18: 40 meq via ORAL
  Filled 2023-07-18: qty 2

## 2023-07-18 MED ORDER — LACTATED RINGERS IV SOLN: INTRAVENOUS | Status: AC

## 2023-07-18 MED ORDER — ENSURE ENLIVE PO LIQD
237.0000 mL | Freq: Two times a day (BID) | ORAL | Status: DC
  Administered 2023-07-18 – 2023-08-08 (×15): 237 mL via ORAL

## 2023-07-18 NOTE — Plan of Care (Signed)
Received care of pt in bed restless and calling out. Pt reoriented to P, P, and T. Pt given Ativan and Haldol for agitation with relief. Posey vest, intact. Condom cath intact, with good urine output. VSS, afebrile. Will continue with plan of care.

## 2023-07-18 NOTE — Progress Notes (Signed)
NAME:  Gregory [name], MRN:  [id number], DOB:  Mar 20, 1961, LOS: 2 ADMISSION DATE:  07/15/2023, CONSULTATION DATE:  1/28 REFERRING MD:  Dr. Pilar Plate, CHIEF COMPLAINT:  Agitation Delirium   History of Present Illness:  Patient is a 63 year old male with pertinent PMH DMT2, HTN, HLD, CKD 3B, COPD, chronic osteomyelitis s/p R AKA, L midfoot amputation, stroke, A-fib, HFpEF, mesenteric ischemia s/p colostomy 12/2022 presents to [facility] ED on 1/27 with agitation delirium.  Patient recently admitted on 1/9-1/23 with septic shock and AMS secondary to ESBL UTI and Serratia/Klebsiella multifocal pneumonia/positive rhinovirus.  Had acute respiratory failure requiring intubation.  Patient is MRI also showing acute on chronic right PCA infarct.  On discharge patient fully alert and mostly oriented and was discharged to [facility] on Lyrica 125 mg 3 times daily and Seroquel 100 Mg nightly.  On 1/27 patient was throwing himself on the floor at [facility].  More confused and was brought to [facility] ED for further eval.  On arrival patient alert.  Vital stable and afebrile.  UA clear.  CXR improved opacities from prior cxr 1/18. WBC 11.1 and LA 0.6 glucose 104.  Ammonia and VBG unremarkable.  CT head with no evidence of acute abnormality.  Patient agitated requiring multiple medications oxy, Ativan, Versed, morphine.  Despite medications patient required Precedex.  PCCM consulted for ICU admission  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)    Acquired absence of right leg below knee (HCC)    Acute osteomyelitis of calcaneum, right (HCC) 10/02/2016   Acute respiratory failure (HCC)    Amputation stump infection (HCC) 08/12/2018   Anemia    Basal cell carcinoma, eyelid    Cancer (HCC)    Candidiasis 08/12/2018   CHF (congestive heart failure) (HCC)    chronic diastolic    Chronic back pain    Chronic kidney disease    stage 3    Chronic multifocal  osteomyelitis (HCC)    of ankle and foot    Chronic pain syndrome    COPD (chronic obstructive pulmonary disease) (HCC)    DDD (degenerative disc disease), lumbar    Depression    major depressive disorder    Diabetes mellitus without complication (HCC)    type 2    Diabetic ulcer of toe of left foot (HCC) 10/02/2016   Difficult intubation    Difficulty in walking, not elsewhere classified    Epidural abscess 10/02/2016   Foot drop, bilateral    Foot drop, right 12/27/2015   GERD (gastroesophageal reflux disease)    Herpesviral vesicular dermatitis    History of COVID-19 03/15/2022   Hyperlipidemia    Hyperlipidemia    Hypertension    Insomnia    Malignant melanoma of other parts of face (HCC)    Melanoma (HCC)    MRSA bacteremia    Muscle weakness (generalized)    Nasal congestion    Necrosis of toe (HCC) 07/08/2018   Neuromuscular dysfunction of bladder    Osteomyelitis (HCC)    Osteomyelitis (HCC)    Osteomyelitis (HCC)    right BKA   Other idiopathic peripheral autonomic neuropathy    Retention of urine, unspecified    Squamous cell carcinoma of skin    Stroke (HCC)    Unsteadiness on feet    Urinary retention    Urinary retention    Urinary tract infection    Wears dentures  Wears glasses      Significant Hospital Events: Including procedures, antibiotic start and stop dates in addition to other pertinent events   1/28 agitation delirium requiring Precedex; PCCM consulted 1/29 precedex d/c'd  Interim History / Subjective:  Improved. Still delirious but better. Slept some overnight.  Objective   Blood pressure (!) 132/121, pulse 68, temperature 98 F (36.7 C), temperature source Axillary, resp. rate 17, height 6' (1.829 m), weight 81.7 kg, SpO2 98%.        Intake/Output Summary (Last 24 hours) at 07/18/2023 1425 Last data filed at 07/18/2023 1200 Gross per 24 hour  Intake 360 ml  Output 1470 ml  Net -1110 ml   Filed Weights   07/16/23 0637  07/16/23 0930  Weight: 80.6 kg 81.7 kg    Examination: General:   NAD HEENT: MM pink/moist Neuro: Confused patient but alert, able to state name; mae; perrl CV: s1s2, RRR, no m/r/g PULM:  dim clear BS bilaterally; Room air GI: soft, bsx4 active  Extremities: warm/dry, R AKA; L toe amputation and skin tear on leg   Resolved Hospital Problem list     Assessment & Plan:   Acute encephalopathy/agitation delirium:  of unknown cause; had agitation delirium on recent admission requiring seroquel and acute on chronic PCA stroke; is on multiple psych meds at home facility. -CT head no acute abnormality; ammonia and UA WNL Plan: -Increase seroquel to 200 BID, PRN Haldol, PRN ativan  COPD Plan: -Brovana, Pulmicort, Yupelri -As needed DuoNeb  Mild leukocytosis Plan: -Afebrile -will hold on initiating antibiotics  -PCT negative, no source of infection identified  Chronic right PCA infarct Plan: -ASA and statin   HFrEF HTN HLD Plan: -Coreg, hydralazine PO   T2DM Plan: -CBG monitoring  CKD 3B Plan: -Trend BMP / urinary output -Replace electrolytes as indicated -Avoid nephrotoxic agents, ensure adequate renal perfusion  Chronic pain Plan: -increase oxycodone 1/20 - various doses over last several weeks  Depression/anxiety Plan: -citalopram   Best Practice (right click and "Reselect all SmartList Selections" daily)   Diet/type: Regular consistency (see orders) DVT prophylaxis prophylactic heparin  Pressure ulcer(s): present on admission  GI prophylaxis: H2B Lines: N/A Foley:  N/A Code Status:  full code Last date of multidisciplinary goals of care discussion [Daughter updated 1/30]  Labs   CBC: Recent Labs  Lab 07/15/23 2022 07/15/23 2037 07/16/23 0545 07/16/23 0807 07/18/23 0325  WBC 11.1*  --  6.0  --  3.6*  NEUTROABS 6.5  --   --   --  1.8  HGB 11.4* 11.6* 10.4* 10.5* 9.5*  HCT 35.8* 34.0* 32.5* 31.0* 29.0*  MCV 89.9  --  91.5  --  89.0  PLT  297  --  228  --  137*    Basic Metabolic Panel: Recent Labs  Lab 07/15/23 1622 07/15/23 2022 07/15/23 2037 07/16/23 0545 07/16/23 0807 07/17/23 0924 07/18/23 0325  NA 141 135 140 137 142 140 137  K 4.0 3.8 4.0 3.8 3.9 4.2 3.5  CL 109 108  --  110  --  112* 110  CO2  --  18*  --  17*  --  17* 16*  GLUCOSE 148* 124*  --  137*  --  113* 124*  BUN 52* 54*  --  47*  --  44* 40*  CREATININE 2.00* 1.75*  --  1.56*  --  1.78* 2.04*  CALCIUM  --  9.8  --  9.5  --  9.4 8.9  MG  --   --   --  1.7  --  1.9 1.8   GFR: Estimated Creatinine Clearance: 40.2 mL/min (A) (by C-G formula based on SCr of 2.04 mg/dL (H)). Recent Labs  Lab 07/15/23 2022 07/15/23 2037 07/16/23 0545 07/18/23 0325  PROCALCITON  --   --  <0.10  --   WBC 11.1*  --  6.0 3.6*  LATICACIDVEN  --  0.6  --   --     Liver Function Tests: Recent Labs  Lab 07/15/23 2022  AST 25  ALT 21  ALKPHOS 99  BILITOT 0.7  PROT 7.5  ALBUMIN 3.4*   No results for input(s): "LIPASE", "AMYLASE" in the last 168 hours. Recent Labs  Lab 07/15/23 2022  AMMONIA 18    ABG    Component Value Date/Time   PHART 7.358 07/16/2023 0807   PCO2ART 32.8 07/16/2023 0807   PO2ART 67 (L) 07/16/2023 0807   HCO3 18.5 (L) 07/16/2023 0807   TCO2 20 (L) 07/16/2023 0807   ACIDBASEDEF 6.0 (H) 07/16/2023 0807   O2SAT 93 07/16/2023 0807     Coagulation Profile: No results for input(s): "INR", "PROTIME" in the last 168 hours.  Cardiac Enzymes: No results for input(s): "CKTOTAL", "CKMB", "CKMBINDEX", "TROPONINI" in the last 168 hours.  HbA1C: Hemoglobin A1C  Date/Time Value Ref Range Status  08/14/2016 12:00 AM 5.6  Final  01/18/2016 12:00 AM 5.5  Final   Hgb A1c MFr Bld  Date/Time Value Ref Range Status  06/27/2023 09:25 AM 6.4 (H) 4.8 - 5.6 % Final    Comment:    (NOTE) Pre diabetes:          5.7%-6.4%  Diabetes:              >6.4%  Glycemic control for   <7.0% adults with diabetes   12/03/2022 04:54 PM 6.9 (H) 4.8 - 5.6  % Final    Comment:    (NOTE) Pre diabetes:          5.7%-6.4%  Diabetes:              >6.4%  Glycemic control for   <7.0% adults with diabetes     CBG: Recent Labs  Lab 07/17/23 2011 07/18/23 0017 07/18/23 0410 07/18/23 0743 07/18/23 1156  GLUCAP 136* 124* 126* 102* 123*    Review of Systems:   Patient is encephalopathic; therefore, history has been obtained from chart review.    Past Medical History:  He,  has a past medical history of Acquired absence of left great toe (HCC), Acquired absence of right leg below knee [facility]), Acute osteomyelitis of calcaneum, right (HCC) (10/02/2016), Acute respiratory failure (HCC), Amputation stump infection (HCC) (08/12/2018), Anemia, Basal cell carcinoma, eyelid, Cancer (HCC), Candidiasis (08/12/2018), CHF (congestive heart failure) (HCC), Chronic back pain, Chronic kidney disease, Chronic multifocal osteomyelitis (HCC), Chronic pain syndrome, COPD (chronic obstructive pulmonary disease) (HCC), DDD (degenerative disc disease), lumbar, Depression, Diabetes mellitus without complication (HCC), Diabetic ulcer of toe of left foot (HCC) (10/02/2016), Difficult intubation, Difficulty in walking, not elsewhere classified, 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), Foot drop, bilateral, Foot drop, right (12/27/2015), GERD (gastroesophageal reflux disease), Herpesviral vesicular dermatitis, History of COVID-19 (03/15/2022), Hyperlipidemia, Hyperlipidemia, Hypertension, Insomnia, Malignant melanoma of other parts of face (HCC), Melanoma (HCC), MRSA bacteremia, Muscle weakness (generalized), Nasal congestion, Necrosis of toe (HCC) (07/08/2018), Neuromuscular dysfunction of bladder, Osteomyelitis (HCC), Osteomyelitis (HCC), Osteomyelitis (HCC), Other idiopathic peripheral autonomic neuropathy, Retention of urine, unspecified, Squamous cell carcinoma of skin, Stroke (HCC), Unsteadiness on feet, Urinary retention, Urinary retention, Urinary tract infection, Wears dentures, and  Wears glasses.   Surgical History:   Past Surgical History:  Procedure Laterality Date   AMPUTATION Left 03/29/2017   Procedure: LEFT GREAT TOE AMPUTATION AT METATARSOPHALANGEAL JOINT;  Surgeon: Nadara Mustard, MD;  Location: [facility] OR;  Service: Orthopedics;  Laterality: Left;   AMPUTATION Right 03/29/2017   Procedure: RIGHT BELOW KNEE AMPUTATION;  Surgeon: Nadara Mustard, MD;  Location: [facility] OR;  Service: Orthopedics;  Laterality: Right;   AMPUTATION Left 06/06/2018   Procedure: LEFT 2ND TOE AMPUTATION;  Surgeon: Nadara Mustard, MD;  Location: [facility] OR;  Service: Orthopedics;  Laterality: Left;   AMPUTATION Right 11/19/2018   Procedure: AMPUTATION ABOVE KNEE;  Surgeon: Nadara Mustard, MD;  Location: [facility] OR;  Service: Orthopedics;  Laterality: Right;   ANTERIOR CERVICAL CORPECTOMY N/A 11/25/2015   Procedure: ANTERIOR CERVICAL FIVE CORPECTOMY Cervical four - six fusion;  Surgeon: Lisbeth Renshaw, MD;  Location: [facility];  Service: Neurosurgery;  Laterality: N/A;  ANTERIOR CERVICAL FIVE CORPECTOMY Cervical four - six fusion   APPLICATION OF WOUND VAC Right 10/22/2018   Procedure: Application Of Wound Vac;  Surgeon: Nadara Mustard, MD;  Location: [facility] OR;  Service: Orthopedics;  Laterality: Right;   CYSTOSCOPY WITH BIOPSY N/A 05/01/2022   Procedure: CYSTOSCOPY WITH URETHRAL BIOPSY;  Surgeon: Noel Christmas, MD;  Location: [facility] ORS;  Service: Urology;  Laterality: N/A;  1 HR   CYSTOSCOPY WITH RETROGRADE URETHROGRAM N/A 04/25/2018   Procedure: CYSTOSCOPY WITH RETROGRADE URETHROGRAM/ BALLOON DILATION;  Surgeon: Ihor Gully, MD;  Location: [facility] ORS;  Service: Urology;  Laterality: N/A;   CYSTOSCOPY WITH URETHRAL DILATATION N/A 05/01/2022   Procedure: BALLOON DILATION WITH  OPTILUME;  Surgeon: Noel Christmas, MD;  Location: [facility] ORS;  Service: Urology;  Laterality: N/A;   LAPAROTOMY N/A 12/03/2022   Procedure: EXPLORATORY LAPAROTOMY  total abdominal colectomy with end ileostomy;  Surgeon: Kinsinger, De Blanch, MD;  Location: [facility] OR;  Service: General;  Laterality: N/A;   MULTIPLE TOOTH EXTRACTIONS     POSTERIOR CERVICAL FUSION/FORAMINOTOMY N/A 11/29/2015   Procedure: Cervical Three-Cervical Seven Posterior Cervical Laminectomy with Fusion;  Surgeon: Lisbeth Renshaw, MD;  Location: [facility];  Service: Neurosurgery;  Laterality: N/A;  Cervical Three-Cervical Seven Posterior Cervical Laminectomy with Fusion   STUMP REVISION Right 10/22/2018   Procedure: REVISION RIGHT BELOW KNEE AMPUTATION;  Surgeon: Nadara Mustard, MD;  Location: [facility] OR;  Service: Orthopedics;  Laterality: Right;   STUMP REVISION Right 12/05/2018   Procedure: Revision Right Above Knee Amputation;  Surgeon: Nadara Mustard, MD;  Location: [facility] OR;  Service: Orthopedics;  Laterality: Right;   STUMP REVISION Right 05/29/2019   Procedure: REVISION RIGHT ABOVE KNEE AMPUTATION;  Surgeon: Nadara Mustard, MD;  Location[facility] OR;  Service: Orthopedics;  Laterality: Right;   STUMP REVISION Right 06/24/2019   Procedure: STUMP REVISION;  Surgeon: Nadara Mustard, MD;  Location: [facility] OR;  Service: Orthopedics;  Laterality: Right;     Social History:   reports that he has been smoking cigarettes. He has never used smokeless tobacco. He reports that he does not drink alcohol and does not use drugs.   Family History:  His family history includes Bone cancer in his father; Cancer in his father, mother, paternal grandfather, and another family member; Diabetes in his mother and another family member; Heart disease in his mother; Prostate cancer in his father.   Allergies Allergies  Allergen Reactions   Latex Other (See Comments)    Unknown reaction  Home Medications  Prior to Admission medications   Medication Sig Start Date End Date Taking? Authorizing Provider  acetaminophen (TYLENOL) 500 MG tablet Take 1,000 mg by mouth in the morning and at bedtime.    [provider]  aspirin EC 81 MG tablet Take 81 mg by mouth daily. Swallow  whole.    [provider]  atorvastatin (LIPITOR) 80 MG tablet Take 1 tablet (80 mg total) by mouth daily. Patient taking differently: Take 80 mg by mouth at bedtime. 09/22/22 01/27/24  Jerald Kief, MD  carvedilol (COREG) 12.5 MG tablet Take 1 tablet (12.5 mg total) by mouth 2 (two) times daily with a meal. 12/25/22   Dorcas Carrow, MD  Cholecalciferol 25 MCG (1000 UT) capsule Take 2,000 Units by mouth daily.    [provider]  Cranberry 450 MG CAPS Take 450 mg by mouth daily.    [provider]  Dextran 70-Hypromellose (NATURAL BALANCE TEARS) 0.1-0.3 % SOLN Place 1 drop into both eyes every 4 (four) hours as needed (dry eyes).    [provider]  docusate (COLACE) 50 MG/5ML liquid Take 10 mLs (100 mg total) by mouth 2 (two) times daily as needed for mild constipation. 07/11/23   Jerald Kief, MD  escitalopram (LEXAPRO) 20 MG tablet Take 20 mg by mouth daily.    [provider]  famotidine (PEPCID) 20 MG tablet Take 1 tablet (20 mg total) by mouth daily. 07/12/23 08/11/23  Jerald Kief, MD  ferrous sulfate 325 (65 FE) MG tablet Take 1 tablet (325 mg total) by mouth 3 (three) times daily with meals. Patient taking differently: Take 325 mg by mouth daily. With meal. 01/23/23   Cathren Laine, MD  fluticasone (FLONASE) 50 MCG/ACT nasal spray Place 1 spray into both nostrils daily.    [provider]  furosemide (LASIX) 20 MG tablet Take 20-40 mg by mouth See admin instructions. Take 40 mg by mouth in the morning and 20 mg by mouth in the afternoon    [provider]  haloperidol lactate (HALDOL) 5 MG/ML injection Inject 1 mg into the muscle every 6 (six) hours as needed (agitation/psychosis).    [provider]  hydrALAZINE (APRESOLINE) 10 MG tablet Take 1 tablet (10 mg total) by mouth every 8 (eight) hours. 12/25/22   Dorcas Carrow, MD  hydrOXYzine (VISTARIL) 50 MG capsule Take 50 mg by mouth at bedtime.    [provider]   insulin glargine (LANTUS SOLOSTAR) 100 UNIT/ML Solostar Pen Inject 15 Units into the skin daily. Patient taking differently: Inject 15 Units into the skin at bedtime. 09/07/22   Rai, Ripudeep K, MD  insulin lispro (HUMALOG) 100 UNIT/ML KwikPen Inject 0-10 Units into the skin See admin instructions. Inject 0-10 units into the skin three times a day and at bedtime, PER SLIDING SCALE:  BGL 151-200 = 2 units 201-250 = 4 units 251-300 = 6 units 301-350 =  8 units 351-400 = 10 units Patient taking differently: Inject 0-10 Units into the skin See admin instructions. Inject 0-10 units into the skin before meals and at bedtime, PER SLIDING SCALE:  0-150 = 0 units 151-200 = 2 units 201-250 = 4 units 251-300 = 6 units 301-350 =  8 units 351-400 = 10 units 01/31/22   Dahal, Melina Schools, MD  ipratropium-albuterol (DUONEB) 0.5-2.5 (3) MG/3ML SOLN Take 3 mLs by nebulization every 6 (six) hours as needed (for shortness of breath).    [provider]  LACTOBACILLUS PO Take  1 capsule by mouth in the morning and at bedtime.    [provider]  lidocaine (LIDODERM) 5 % Place 2 patches onto the skin daily. Remove & Discard patch within 12 hours or as directed by MD Patient taking differently: Place 1 patch onto the skin in the morning and at bedtime. Take off after 12 hours 12/25/22   Dorcas Carrow, MD  loperamide (IMODIUM) 2 MG capsule Take 1 capsule (2 mg total) by mouth 2 (two) times daily. 12/25/22   Dorcas Carrow, MD  loratadine (CLARITIN) 10 MG tablet Take 10 mg by mouth in the morning.    [provider]  magnesium oxide (MAG-OX) 400 (240 Mg) MG tablet Take 400 mg by mouth in the morning, at noon, and at bedtime.    [provider]  Melatonin 10 MG TABS Take 10 mg by mouth at bedtime.    [provider]  Meth-Hyo-M Bl-Na Phos-Ph Sal (URO-MP) 118 MG CAPS Take 118 mg by mouth 2 (two) times daily.    [provider]  Multiple Vitamin (MULTIVITAMIN WITH MINERALS)  TABS tablet Take 1 tablet by mouth daily. 12/25/22   Dorcas Carrow, MD  omeprazole (PRILOSEC) 20 MG capsule Take 20 mg by mouth at bedtime.    [provider]  ondansetron (ZOFRAN) 4 MG tablet Take 4 mg by mouth every 8 (eight) hours as needed for nausea or vomiting.    [provider]  oxybutynin (DITROPAN-XL) 5 MG 24 hr tablet Take 5 mg by mouth in the morning and at bedtime.    [provider]  oxyCODONE (OXY IR/ROXICODONE) 5 MG immediate release tablet Take 1 tablet (5 mg total) by mouth every 6 (six) hours as needed for moderate pain (pain score 4-6). 07/11/23   Jerald Kief, MD  oxyCODONE (OXYCONTIN) 15 mg 12 hr tablet Take 1 tablet (15 mg total) by mouth in the morning, at noon, and at bedtime. 07/11/23   Jerald Kief, MD  OXYGEN Inhale 2 L into the lungs as needed (to maintain sats above 90/SOB).    [provider]  oxymetazoline (AFRIN) 0.05 % nasal spray Place 1 spray into both nostrils 2 (two) times daily as needed for congestion.    [provider]  pregabalin (LYRICA) 100 MG capsule Take 1 capsule (100 mg total) by mouth 2 (two) times daily. Patient taking differently: Take 100 mg by mouth 3 (three) times daily. 12/25/22 06/27/23  Dorcas Carrow, MD  pregabalin (LYRICA) 25 MG capsule Take 25 mg by mouth 3 (three) times daily.    [provider]  QUEtiapine (SEROQUEL) 100 MG tablet Take 100 mg by mouth at bedtime.    [provider]  senna (SENOKOT) 8.6 MG TABS tablet Take 2 tablets by mouth at bedtime.    [provider]  sodium bicarbonate 650 MG tablet Take 650 mg by mouth 2 (two) times daily.    [provider]  tamsulosin (FLOMAX) 0.4 MG CAPS capsule Take 2 capsules (0.8 mg total) by mouth daily. Patient taking differently: Take 0.4 mg by mouth daily. 02/09/23   Peterson Ao, MD  tirzepatide [facility]) 2.5 MG/0.5ML Pen Inject 2.5 mg into the skin once a week. On Tuesdays    [provider]   trolamine salicylate (ASPERCREME) 10 % cream Apply 1 Application topically in the morning, at noon, in the evening, and at bedtime. Right leg    [provider]  UNABLE TO FIND Take 30 mLs by mouth in the  morning and at bedtime. Med Name: liquid protein    [provider]     Critical care time: n/a     Karren Burly, MD [facility] 07/18/2023, 2:25 PM  Please see [email or web address] for pager details. From 7A-7P if no response, please call [phone]. After hours, please call ELink [phone].

## 2023-07-19 DIAGNOSIS — N1832 Chronic kidney disease, stage 3b: Secondary | ICD-10-CM | POA: Diagnosis not present

## 2023-07-19 DIAGNOSIS — G9340 Encephalopathy, unspecified: Secondary | ICD-10-CM | POA: Diagnosis not present

## 2023-07-19 LAB — CBC
HCT: 26.1 % — ABNORMAL LOW (ref 39.0–52.0)
Hemoglobin: 8.3 g/dL — ABNORMAL LOW (ref 13.0–17.0)
MCH: 28.8 pg (ref 26.0–34.0)
MCHC: 31.8 g/dL (ref 30.0–36.0)
MCV: 90.6 fL (ref 80.0–100.0)
Platelets: 138 10*3/uL — ABNORMAL LOW (ref 150–400)
RBC: 2.88 MIL/uL — ABNORMAL LOW (ref 4.22–5.81)
RDW: 17 % — ABNORMAL HIGH (ref 11.5–15.5)
WBC: 4.5 10*3/uL (ref 4.0–10.5)
nRBC: 0 % (ref 0.0–0.2)

## 2023-07-19 LAB — BASIC METABOLIC PANEL
Anion gap: 10 (ref 5–15)
BUN: 34 mg/dL — ABNORMAL HIGH (ref 8–23)
CO2: 17 mmol/L — ABNORMAL LOW (ref 22–32)
Calcium: 8.9 mg/dL (ref 8.9–10.3)
Chloride: 109 mmol/L (ref 98–111)
Creatinine, Ser: 1.54 mg/dL — ABNORMAL HIGH (ref 0.61–1.24)
GFR, Estimated: 50 mL/min — ABNORMAL LOW (ref >60)
Glucose, Bld: 161 mg/dL — ABNORMAL HIGH (ref 70–99)
Potassium: 4.1 mmol/L (ref 3.5–5.1)
Sodium: 136 mmol/L (ref 135–145)

## 2023-07-19 LAB — GLUCOSE, CAPILLARY
Glucose-Capillary: 105 mg/dL — ABNORMAL HIGH (ref 70–99)
Glucose-Capillary: 111 mg/dL — ABNORMAL HIGH (ref 70–99)
Glucose-Capillary: 120 mg/dL — ABNORMAL HIGH (ref 70–99)
Glucose-Capillary: 142 mg/dL — ABNORMAL HIGH (ref 70–99)
Glucose-Capillary: 147 mg/dL — ABNORMAL HIGH (ref 70–99)
Glucose-Capillary: 174 mg/dL — ABNORMAL HIGH (ref 70–99)

## 2023-07-19 LAB — AMMONIA: Ammonia: 28 umol/L (ref 9–35)

## 2023-07-19 LAB — HIV ANTIBODY (ROUTINE TESTING W REFLEX): HIV Screen 4th Generation wRfx: NONREACTIVE

## 2023-07-19 LAB — MAGNESIUM: Magnesium: 1.7 mg/dL (ref 1.7–2.4)

## 2023-07-19 MED ORDER — PREGABALIN 25 MG PO CAPS
75.0000 mg | ORAL_CAPSULE | Freq: Two times a day (BID) | ORAL | Status: DC
  Administered 2023-07-19 – 2023-07-20 (×2): 75 mg via ORAL
  Filled 2023-07-19 (×2): qty 3

## 2023-07-19 MED ORDER — OXYCODONE HCL 5 MG PO TABS: 5.0000 mg | ORAL_TABLET | Freq: Four times a day (QID) | ORAL | Status: DC | PRN

## 2023-07-19 MED ORDER — THIAMINE HCL 100 MG/ML IJ SOLN
500.0000 mg | Freq: Three times a day (TID) | INTRAVENOUS | Status: AC
  Administered 2023-07-19 – 2023-07-21 (×6): 500 mg via INTRAVENOUS
  Filled 2023-07-19 (×6): qty 5

## 2023-07-19 MED ORDER — HYDROMORPHONE HCL 1 MG/ML IJ SOLN
INTRAMUSCULAR | Status: AC
  Filled 2023-07-19: qty 2

## 2023-07-19 MED ORDER — THIAMINE MONONITRATE 100 MG PO TABS
100.0000 mg | ORAL_TABLET | Freq: Every day | ORAL | Status: DC
  Administered 2023-07-22 – 2023-08-18 (×25): 100 mg via ORAL
  Filled 2023-07-19 (×29): qty 1

## 2023-07-19 MED ORDER — HYDROMORPHONE HCL 1 MG/ML IJ SOLN
2.0000 mg | INTRAMUSCULAR | Status: DC | PRN
  Administered 2023-07-19 – 2023-07-20 (×2): 2 mg via INTRAVENOUS
  Filled 2023-07-19 (×2): qty 2

## 2023-07-19 NOTE — Progress Notes (Signed)
NAME:  Gregory [name], MRN:  [id number], DOB:  1961-03-04, LOS: 3 ADMISSION DATE:  07/15/2023, CONSULTATION DATE:  1/28 REFERRING MD:  Dr. Pilar Plate, CHIEF COMPLAINT:  Agitation Delirium   History of Present Illness:  Patient is a 63 year old male with pertinent PMH DMT2, HTN, HLD, CKD 3B, COPD, chronic osteomyelitis s/p R AKA, L midfoot amputation, stroke, A-fib, HFpEF, mesenteric ischemia s/p colostomy 12/2022 presents to [facility] ED on 1/27 with agitation delirium.  Patient recently admitted on 1/9-1/23 with septic shock and AMS secondary to ESBL UTI and Serratia/Klebsiella multifocal pneumonia/positive rhinovirus.  Had acute respiratory failure requiring intubation.  Patient is MRI also showing acute on chronic right PCA infarct.  On discharge patient fully alert and mostly oriented and was discharged to [facility] on Lyrica 125 mg 3 times daily and Seroquel 100 Mg nightly.  On 1/27 patient was throwing himself on the floor at [facility].  More confused and was brought to [facility] ED for further eval.  On arrival patient alert.  Vital stable and afebrile.  UA clear.  CXR improved opacities from prior cxr 1/18. WBC 11.1 and LA 0.6 glucose 104.  Ammonia and VBG unremarkable.  CT head with no evidence of acute abnormality.  Patient agitated requiring multiple medications oxy, Ativan, Versed, morphine.  Despite medications patient required Precedex.  PCCM consulted for ICU admission  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)    Acquired absence of right leg below knee (HCC)    Acute osteomyelitis of calcaneum, right (HCC) 10/02/2016   Acute respiratory failure (HCC)    Amputation stump infection (HCC) 08/12/2018   Anemia    Basal cell carcinoma, eyelid    Cancer (HCC)    Candidiasis 08/12/2018   CHF (congestive heart failure) (HCC)    chronic diastolic    Chronic back pain    Chronic kidney disease    stage 3    Chronic multifocal  osteomyelitis (HCC)    of ankle and foot    Chronic pain syndrome    COPD (chronic obstructive pulmonary disease) (HCC)    DDD (degenerative disc disease), lumbar    Depression    major depressive disorder    Diabetes mellitus without complication (HCC)    type 2    Diabetic ulcer of toe of left foot (HCC) 10/02/2016   Difficult intubation    Difficulty in walking, not elsewhere classified    Epidural abscess 10/02/2016   Foot drop, bilateral    Foot drop, right 12/27/2015   GERD (gastroesophageal reflux disease)    Herpesviral vesicular dermatitis    History of COVID-19 03/15/2022   Hyperlipidemia    Hyperlipidemia    Hypertension    Insomnia    Malignant melanoma of other parts of face (HCC)    Melanoma (HCC)    MRSA bacteremia    Muscle weakness (generalized)    Nasal congestion    Necrosis of toe (HCC) 07/08/2018   Neuromuscular dysfunction of bladder    Osteomyelitis (HCC)    Osteomyelitis (HCC)    Osteomyelitis (HCC)    right BKA   Other idiopathic peripheral autonomic neuropathy    Retention of urine, unspecified    Squamous cell carcinoma of skin    Stroke (HCC)    Unsteadiness on feet    Urinary retention    Urinary retention    Urinary tract infection    Wears dentures  Wears glasses      Significant Hospital Events: Including procedures, antibiotic start and stop dates in addition to other pertinent events   1/28 agitation delirium requiring Precedex; PCCM consulted 1/29 precedex d/c'd  Interim History / Subjective:    Objective   Blood pressure (!) 144/75, pulse 69, temperature 97.9 F (36.6 C), temperature source Axillary, resp. rate 19, height 6' (1.829 m), weight 81.7 kg, SpO2 100%.        Intake/Output Summary (Last 24 hours) at 07/19/2023 1008 Last data filed at 07/19/2023 0800 Gross per 24 hour  Intake 1264.65 ml  Output 1850 ml  Net -585.35 ml   Filed Weights   07/16/23 1610 07/16/23 0930  Weight: 80.6 kg 81.7 kg     Examination: General:   NAD HEENT: MM pink/moist Neuro: Confused patient but alert, able to state name; mae; perrl CV: s1s2, RRR, no m/r/g PULM:  dim clear BS bilaterally; Room air GI: soft, bsx4 active  Extremities: warm/dry, R AKA; L toe amputation and skin tear on leg   Assessment & Plan:   Acute encephalopathy/agitation delirium:  of unknown cause; had agitation delirium on recent admission requiring seroquel and acute on chronic PCA stroke; is on multiple psych meds at home facility.  Suspect this is a chronic issue that will be tough to get on top of. AKI on CKD3b- recheck pending Pancytopenia- rechecking, RVP neg COPD HFrEF HTN HLD T2DM CKD 3B Chronic pain Depression/anxiety  - Regimen is now oxycodone 10 QID, seroquel 200 BID, lyrica 75 BID, lexapro, and PRN haldol/oxycodone - f/u BMP, CBC, adjust fluids; consider further pancytopenia workup - high dose thiamine trial - encourage PO if able - Stable to return to SNF once he is tolerating PO, labs more stable, and agitation better controlled - Stable for med surg  Myrla Halsted MD PCCM

## 2023-07-19 NOTE — Progress Notes (Signed)
Elink Nurse Jun notified that patient hadnt voided. Pt bladder scanned amount indicated was >308. N.O. pending for I&O cath

## 2023-07-19 NOTE — Plan of Care (Signed)
Pt alert and oriented x2, intermittently agitated medicated with PRN Ativan and Haldol. Sitter at bedside and bed alarm on. MIVF infusing as ordered. Pt frequently reoriented and closely monitored. No s/s of distress will continue to monitor.

## 2023-07-20 DIAGNOSIS — N1832 Chronic kidney disease, stage 3b: Secondary | ICD-10-CM | POA: Diagnosis not present

## 2023-07-20 DIAGNOSIS — G9340 Encephalopathy, unspecified: Secondary | ICD-10-CM | POA: Diagnosis not present

## 2023-07-20 LAB — CBC
HCT: 28 % — ABNORMAL LOW (ref 39.0–52.0)
Hemoglobin: 8.9 g/dL — ABNORMAL LOW (ref 13.0–17.0)
MCH: 28.8 pg (ref 26.0–34.0)
MCHC: 31.8 g/dL (ref 30.0–36.0)
MCV: 90.6 fL (ref 80.0–100.0)
Platelets: 111 10*3/uL — ABNORMAL LOW (ref 150–400)
RBC: 3.09 MIL/uL — ABNORMAL LOW (ref 4.22–5.81)
RDW: 16.9 % — ABNORMAL HIGH (ref 11.5–15.5)
WBC: 4.7 10*3/uL (ref 4.0–10.5)
nRBC: 0 % (ref 0.0–0.2)

## 2023-07-20 LAB — GLUCOSE, CAPILLARY
Glucose-Capillary: 108 mg/dL — ABNORMAL HIGH (ref 70–99)
Glucose-Capillary: 123 mg/dL — ABNORMAL HIGH (ref 70–99)
Glucose-Capillary: 155 mg/dL — ABNORMAL HIGH (ref 70–99)
Glucose-Capillary: 81 mg/dL (ref 70–99)
Glucose-Capillary: 83 mg/dL (ref 70–99)
Glucose-Capillary: 96 mg/dL (ref 70–99)
Glucose-Capillary: 98 mg/dL (ref 70–99)

## 2023-07-20 LAB — BASIC METABOLIC PANEL
Anion gap: 10 (ref 5–15)
BUN: 29 mg/dL — ABNORMAL HIGH (ref 8–23)
CO2: 19 mmol/L — ABNORMAL LOW (ref 22–32)
Calcium: 9.1 mg/dL (ref 8.9–10.3)
Chloride: 110 mmol/L (ref 98–111)
Creatinine, Ser: 1.56 mg/dL — ABNORMAL HIGH (ref 0.61–1.24)
GFR, Estimated: 49 mL/min — ABNORMAL LOW (ref >60)
Glucose, Bld: 124 mg/dL — ABNORMAL HIGH (ref 70–99)
Potassium: 3.9 mmol/L (ref 3.5–5.1)
Sodium: 139 mmol/L (ref 135–145)

## 2023-07-20 LAB — CULTURE, BLOOD (ROUTINE X 2)
Culture: NO GROWTH
Culture: NO GROWTH
Special Requests: ADEQUATE

## 2023-07-20 MED ORDER — ACETAMINOPHEN 325 MG PO TABS
650.0000 mg | ORAL_TABLET | Freq: Four times a day (QID) | ORAL | Status: DC
  Administered 2023-07-20 – 2023-08-09 (×64): 650 mg via ORAL
  Filled 2023-07-20 (×66): qty 2

## 2023-07-20 MED ORDER — CHLORHEXIDINE GLUCONATE CLOTH 2 % EX PADS
6.0000 | MEDICATED_PAD | CUTANEOUS | Status: DC
  Administered 2023-07-21 – 2023-08-13 (×20): 6 via TOPICAL

## 2023-07-20 MED ORDER — HYDROMORPHONE HCL 1 MG/ML IJ SOLN
1.0000 mg | INTRAMUSCULAR | Status: DC | PRN
  Administered 2023-07-20 – 2023-07-30 (×13): 1 mg via INTRAVENOUS
  Filled 2023-07-20 (×19): qty 1

## 2023-07-20 MED ORDER — PREGABALIN 25 MG PO CAPS
100.0000 mg | ORAL_CAPSULE | Freq: Two times a day (BID) | ORAL | Status: DC
Start: 2023-07-20 — End: 2023-07-23
  Administered 2023-07-20 – 2023-07-22 (×6): 100 mg via ORAL
  Filled 2023-07-20: qty 4
  Filled 2023-07-20: qty 1
  Filled 2023-07-20 (×4): qty 4

## 2023-07-20 MED ORDER — HYDROMORPHONE HCL 2 MG PO TABS
2.0000 mg | ORAL_TABLET | Freq: Four times a day (QID) | ORAL | Status: DC
  Administered 2023-07-20 – 2023-08-01 (×33): 2 mg via ORAL
  Filled 2023-07-20 (×37): qty 1

## 2023-07-20 NOTE — Plan of Care (Signed)
  Problem: Safety: Goal: Non-violent Restraint(s) Outcome: Progressing   Problem: Clinical Measurements: Goal: Respiratory complications will improve Outcome: Progressing   Problem: Clinical Measurements: Goal: Cardiovascular complication will be avoided Outcome: Progressing   Problem: Coping: Goal: Level of anxiety will decrease Outcome: Not Progressing

## 2023-07-20 NOTE — Progress Notes (Addendum)
NAME:  Gregory [name], MRN:  [id number], DOB:  08/01/1960, LOS: 4 ADMISSION DATE:  07/15/2023, CONSULTATION DATE:  1/28 REFERRING MD:  Dr. Pilar Plate, CHIEF COMPLAINT:  Agitation Delirium   History of Present Illness:  Patient is a 63 year old male with pertinent PMH DMT2, HTN, HLD, CKD 3B, COPD, chronic osteomyelitis s/p R AKA, L midfoot amputation, stroke, A-fib, HFpEF, mesenteric ischemia s/p colostomy 12/2022 presents to [facility] ED on 1/27 with agitation delirium.  Patient recently admitted on 1/9-1/23 with septic shock and AMS secondary to ESBL UTI and Serratia/Klebsiella multifocal pneumonia/positive rhinovirus.  Had acute respiratory failure requiring intubation.  Patient is MRI also showing acute on chronic right PCA infarct.  On discharge patient fully alert and mostly oriented and was discharged to [facility] on Lyrica 125 mg 3 times daily and Seroquel 100 Mg nightly.  On 1/27 patient was throwing himself on the floor at [facility].  More confused and was brought to [facility] ED for further eval.  On arrival patient alert.  Vital stable and afebrile.  UA clear.  CXR improved opacities from prior cxr 1/18. WBC 11.1 and LA 0.6 glucose 104.  Ammonia and VBG unremarkable.  CT head with no evidence of acute abnormality.  Patient agitated requiring multiple medications oxy, Ativan, Versed, morphine.  Despite medications patient required Precedex.  PCCM consulted for ICU admission  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)    Acquired absence of right leg below knee (HCC)    Acute osteomyelitis of calcaneum, right (HCC) 10/02/2016   Acute respiratory failure (HCC)    Amputation stump infection (HCC) 08/12/2018   Anemia    Basal cell carcinoma, eyelid    Cancer (HCC)    Candidiasis 08/12/2018   CHF (congestive heart failure) (HCC)    chronic diastolic    Chronic back pain    Chronic kidney disease    stage 3    Chronic multifocal  osteomyelitis (HCC)    of ankle and foot    Chronic pain syndrome    COPD (chronic obstructive pulmonary disease) (HCC)    DDD (degenerative disc disease), lumbar    Depression    major depressive disorder    Diabetes mellitus without complication (HCC)    type 2    Diabetic ulcer of toe of left foot (HCC) 10/02/2016   Difficult intubation    Difficulty in walking, not elsewhere classified    Epidural abscess 10/02/2016   Foot drop, bilateral    Foot drop, right 12/27/2015   GERD (gastroesophageal reflux disease)    Herpesviral vesicular dermatitis    History of COVID-19 03/15/2022   Hyperlipidemia    Hyperlipidemia    Hypertension    Insomnia    Malignant melanoma of other parts of face (HCC)    Melanoma (HCC)    MRSA bacteremia    Muscle weakness (generalized)    Nasal congestion    Necrosis of toe (HCC) 07/08/2018   Neuromuscular dysfunction of bladder    Osteomyelitis (HCC)    Osteomyelitis (HCC)    Osteomyelitis (HCC)    right BKA   Other idiopathic peripheral autonomic neuropathy    Retention of urine, unspecified    Squamous cell carcinoma of skin    Stroke (HCC)    Unsteadiness on feet    Urinary retention    Urinary retention    Urinary tract infection    Wears dentures  Wears glasses      Significant Hospital Events: Including procedures, antibiotic start and stop dates in addition to other pertinent events   1/28 agitation delirium requiring Precedex; PCCM consulted 1/29 precedex d/c'd  Interim History / Subjective:  More agitated when pain not under control.  Objective   Blood pressure 128/75, pulse 71, temperature 98.4 F (36.9 C), temperature source Oral, resp. rate 17, height 6' (1.829 m), weight 83.4 kg, SpO2 99%.        Intake/Output Summary (Last 24 hours) at 07/20/2023 1055 Last data filed at 07/20/2023 0400 Gross per 24 hour  Intake 210.03 ml  Output 1300 ml  Net -1089.97 ml   Filed Weights   07/16/23 1610 07/16/23 0930 07/20/23  0500  Weight: 80.6 kg 81.7 kg 83.4 kg    Examination: No distress Globally weak Lungs diminished bases Moves to command Tracking AO to self, place, not time or situation  Plts a little down  Assessment & Plan:   Acute encephalopathy/agitation delirium:  discussed with daughter on phone today: pretty independent 1/8 before hospitalization.  Then some delirium/confusion after that prolonged stay (including vent) but was improved on DC.  Now back to agitated delirium that is not normal for him. Likewise his pain is usually in his R AKA AKI on CKD3b- stable Cr Thrombocytopenia- issue in past, keep an eye on COPD HFrEF HTN HLD T2DM CKD 3B Chronic pain Depression/anxiety  - Change oxy to dilaudid, seroquel 200 BID, lyrica 75 BID, lexapro, and PRN dilaudid - high dose thiamine trial ongoing - encourage PO  - re-orient PRN, encourage day/night cycles - consider MRI back tomorrow if ongoing pain - keep eye on platelets - stable for med/surg - long term I think just needs good pain control, reorientation, and time; if not improving with this there is possibility we are dealing with early vascular dementia with behavioral disturbance  Myrla Halsted MD PCCM

## 2023-07-21 DIAGNOSIS — N1832 Chronic kidney disease, stage 3b: Secondary | ICD-10-CM | POA: Diagnosis not present

## 2023-07-21 DIAGNOSIS — G9340 Encephalopathy, unspecified: Secondary | ICD-10-CM | POA: Diagnosis not present

## 2023-07-21 LAB — GLUCOSE, CAPILLARY
Glucose-Capillary: 104 mg/dL — ABNORMAL HIGH (ref 70–99)
Glucose-Capillary: 124 mg/dL — ABNORMAL HIGH (ref 70–99)
Glucose-Capillary: 117 mg/dL — ABNORMAL HIGH (ref 70–99)
Glucose-Capillary: 89 mg/dL (ref 70–99)
Glucose-Capillary: 74 mg/dL (ref 70–99)
Glucose-Capillary: 127 mg/dL — ABNORMAL HIGH (ref 70–99)

## 2023-07-21 LAB — BASIC METABOLIC PANEL
Anion gap: 8 (ref 5–15)
BUN: 23 mg/dL (ref 8–23)
CO2: 19 mmol/L — ABNORMAL LOW (ref 22–32)
Calcium: 8.6 mg/dL — ABNORMAL LOW (ref 8.9–10.3)
Chloride: 112 mmol/L — ABNORMAL HIGH (ref 98–111)
Creatinine, Ser: 1.58 mg/dL — ABNORMAL HIGH (ref 0.61–1.24)
GFR, Estimated: 49 mL/min — ABNORMAL LOW (ref >60)
Glucose, Bld: 111 mg/dL — ABNORMAL HIGH (ref 70–99)
Potassium: 3.6 mmol/L (ref 3.5–5.1)
Sodium: 139 mmol/L (ref 135–145)

## 2023-07-21 LAB — CBC
HCT: 25.4 % — ABNORMAL LOW (ref 39.0–52.0)
Hemoglobin: 8.2 g/dL — ABNORMAL LOW (ref 13.0–17.0)
MCH: 29.3 pg (ref 26.0–34.0)
MCHC: 32.3 g/dL (ref 30.0–36.0)
MCV: 90.7 fL (ref 80.0–100.0)
Platelets: 126 10*3/uL — ABNORMAL LOW (ref 150–400)
RBC: 2.8 MIL/uL — ABNORMAL LOW (ref 4.22–5.81)
RDW: 17 % — ABNORMAL HIGH (ref 11.5–15.5)
WBC: 4 10*3/uL (ref 4.0–10.5)
nRBC: 0 % (ref 0.0–0.2)

## 2023-07-21 LAB — MAGNESIUM: Magnesium: 1.4 mg/dL — ABNORMAL LOW (ref 1.7–2.4)

## 2023-07-21 MED ORDER — DEXTROSE 50 % IV SOLN
INTRAVENOUS | Status: AC
  Filled 2023-07-21: qty 50

## 2023-07-21 MED ORDER — ZIPRASIDONE MESYLATE 20 MG IM SOLR
20.0000 mg | Freq: Once | INTRAMUSCULAR | Status: AC
  Administered 2023-07-21: 20 mg via INTRAMUSCULAR
  Filled 2023-07-21: qty 20

## 2023-07-21 MED ORDER — HYDROMORPHONE HCL 1 MG/ML IJ SOLN
2.0000 mg | INTRAMUSCULAR | Status: AC
Start: 2023-07-21 — End: 2023-07-21

## 2023-07-21 MED ORDER — QUETIAPINE FUMARATE 100 MG PO TABS
200.0000 mg | ORAL_TABLET | Freq: Once | ORAL | Status: AC
  Administered 2023-07-21: 200 mg via ORAL
  Filled 2023-07-21: qty 2

## 2023-07-21 MED ORDER — STERILE WATER FOR INJECTION IJ SOLN
INTRAMUSCULAR | Status: AC
  Administered 2023-07-21: 10 mL
  Filled 2023-07-21: qty 10

## 2023-07-21 MED ORDER — HALOPERIDOL LACTATE 5 MG/ML IJ SOLN
5.0000 mg | Freq: Once | INTRAMUSCULAR | Status: DC
  Filled 2023-07-21: qty 1

## 2023-07-21 MED ORDER — HYDROMORPHONE HCL 1 MG/ML IJ SOLN
INTRAMUSCULAR | Status: AC
  Administered 2023-07-21: 2 mg via INTRAVENOUS
  Filled 2023-07-21: qty 2

## 2023-07-21 MED ORDER — QUETIAPINE FUMARATE 100 MG PO TABS
400.0000 mg | ORAL_TABLET | Freq: Two times a day (BID) | ORAL | Status: DC
  Administered 2023-07-21: 400 mg via ORAL
  Filled 2023-07-21: qty 4

## 2023-07-21 MED ORDER — MAGNESIUM SULFATE 4 GM/100ML IV SOLN
4.0000 g | Freq: Once | INTRAVENOUS | Status: AC
  Administered 2023-07-21: 4 g via INTRAVENOUS
  Filled 2023-07-21: qty 100

## 2023-07-21 NOTE — Progress Notes (Signed)
NAME:  Gregory [name], MRN:  [id number], DOB:  1961/05/30, LOS: 5 ADMISSION DATE:  07/15/2023, CONSULTATION DATE:  1/28 REFERRING MD:  Dr. Pilar Plate, CHIEF COMPLAINT:  Agitation Delirium   History of Present Illness:  Patient is a 63 year old male with pertinent PMH DMT2, HTN, HLD, CKD 3B, COPD, chronic osteomyelitis s/p R AKA, L midfoot amputation, stroke, A-fib, HFpEF, mesenteric ischemia s/p colostomy 12/2022 presents to [facility] ED on 1/27 with agitation delirium.  Patient recently admitted on 1/9-1/23 with septic shock and AMS secondary to ESBL UTI and Serratia/Klebsiella multifocal pneumonia/positive rhinovirus.  Had acute respiratory failure requiring intubation.  Patient is MRI also showing acute on chronic right PCA infarct.  On discharge patient fully alert and mostly oriented and was discharged to [facility] on Lyrica 125 mg 3 times daily and Seroquel 100 Mg nightly.  On 1/27 patient was throwing himself on the floor at [facility].  More confused and was brought to [facility] ED for further eval.  On arrival patient alert.  Vital stable and afebrile.  UA clear.  CXR improved opacities from prior cxr 1/18. WBC 11.1 and LA 0.6 glucose 104.  Ammonia and VBG unremarkable.  CT head with no evidence of acute abnormality.  Patient agitated requiring multiple medications oxy, Ativan, Versed, morphine.  Despite medications patient required Precedex.  PCCM consulted for ICU admission  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)    Acquired absence of right leg below knee (HCC)    Acute osteomyelitis of calcaneum, right (HCC) 10/02/2016   Acute respiratory failure (HCC)    Amputation stump infection (HCC) 08/12/2018   Anemia    Basal cell carcinoma, eyelid    Cancer (HCC)    Candidiasis 08/12/2018   CHF (congestive heart failure) (HCC)    chronic diastolic    Chronic back pain    Chronic kidney disease    stage 3    Chronic multifocal  osteomyelitis (HCC)    of ankle and foot    Chronic pain syndrome    COPD (chronic obstructive pulmonary disease) (HCC)    DDD (degenerative disc disease), lumbar    Depression    major depressive disorder    Diabetes mellitus without complication (HCC)    type 2    Diabetic ulcer of toe of left foot (HCC) 10/02/2016   Difficult intubation    Difficulty in walking, not elsewhere classified    Epidural abscess 10/02/2016   Foot drop, bilateral    Foot drop, right 12/27/2015   GERD (gastroesophageal reflux disease)    Herpesviral vesicular dermatitis    History of COVID-19 03/15/2022   Hyperlipidemia    Hyperlipidemia    Hypertension    Insomnia    Malignant melanoma of other parts of face (HCC)    Melanoma (HCC)    MRSA bacteremia    Muscle weakness (generalized)    Nasal congestion    Necrosis of toe (HCC) 07/08/2018   Neuromuscular dysfunction of bladder    Osteomyelitis (HCC)    Osteomyelitis (HCC)    Osteomyelitis (HCC)    right BKA   Other idiopathic peripheral autonomic neuropathy    Retention of urine, unspecified    Squamous cell carcinoma of skin    Stroke (HCC)    Unsteadiness on feet    Urinary retention    Urinary retention    Urinary tract infection    Wears dentures  Wears glasses      Significant Hospital Events: Including procedures, antibiotic start and stop dates in addition to other pertinent events   1/28 agitation delirium requiring Precedex; PCCM consulted 1/29 precedex d/c'd  Interim History / Subjective:  More agitated when pain not under control.  Objective   Blood pressure (!) 139/103, pulse 61, temperature 98.5 F (36.9 C), temperature source Oral, resp. rate 14, height 6' (1.829 m), weight 82.2 kg, SpO2 97%.        Intake/Output Summary (Last 24 hours) at 07/21/2023 1610 Last data filed at 07/21/2023 0600 Gross per 24 hour  Intake 693.69 ml  Output 1075 ml  Net -381.31 ml   Filed Weights   07/16/23 0930 07/20/23 0500  07/21/23 0500  Weight: 81.7 kg 83.4 kg 82.2 kg    Examination: No distress Mildly confused but less so than yesterday Pleasant affect Better pain control on new regimen  Assessment & Plan:   Acute encephalopathy/agitation delirium:  improved today, really seems consistent with protracted ICU/critical illness delirium, cannot rule out early onset dementia, have discussed with daughter AKI on CKD3b- stable Cr Thrombocytopenia- issue in past, keep an eye on COPD HFrEF HTN HLD T2DM CKD 3B Chronic pain Depression/anxiety  - Continue dilaudid, lyrica 75 BID, lexapro, and PRN dilaudid; increase seroquel - complete high dose thiamine - encourage PO  - re-orient PRN, encourage day/night cycles - back pain improved, plts okay, I think we do not need to re-image spine - stable for return to SNF at any point  Myrla Halsted MD PCCM

## 2023-07-21 NOTE — Progress Notes (Signed)
eLink Physician-Brief Progress Note Patient Name: Gregory [name] DOB: 01-30-1961 MRN: [id number]   Date of Service  07/21/2023  HPI/Events of Note  Severe agitation, IV displaced, pending ultrasound IV placement.  QTc appropriate  eICU Interventions  IM Haldol x 1.     Intervention Category Minor Interventions: Agitation / anxiety - evaluation and management  Wiatt Mahabir 07/21/2023, 10:27 PM

## 2023-07-21 NOTE — Progress Notes (Signed)
[facility] ADULT ICU REPLACEMENT PROTOCOL   The patient does apply for the [facility] Adult ICU Electrolyte Replacment Protocol based on the criteria listed below:   1.Exclusion criteria: TCTS, ECMO, Dialysis, and Myasthenia Gravis patients 2. Is GFR >/= 30 ml/min? Yes.    Patient's GFR today is 49 3. Is SCr </= 2? Yes.   Patient's SCr is 1.58 mg/dL 4. Did SCr increase >/= 0.5 in 24 hours? No. 5.[name] weight >40kg  Yes.   6. Abnormal electrolyte(s): Mag 1.4  7. Electrolytes replaced per protocol 8.  Call MD STAT for K+ </= 2.5, Phos </= 1, or Mag </= 1 Physician:  Dr Rhodia Albright, Jettie Booze 07/21/2023 6:26 AM

## 2023-07-21 NOTE — Plan of Care (Signed)
  Problem: Safety: Goal: Non-violent Restraint(s) Outcome: Progressing   Problem: Education: Goal: Knowledge of General Education information will improve Description: Including pain rating scale, medication(s)/side effects and non-pharmacologic comfort measures Outcome: Progressing   Problem: Clinical Measurements: Goal: Ability to maintain clinical measurements within normal limits will improve Outcome: Progressing Goal: Will remain free from infection Outcome: Progressing Goal: Diagnostic test results will improve Outcome: Progressing Goal: Respiratory complications will improve Outcome: Progressing Goal: Cardiovascular complication will be avoided Outcome: Progressing   Problem: Nutrition: Goal: Adequate nutrition will be maintained Outcome: Progressing   Problem: Elimination: Goal: Will not experience complications related to bowel motility Outcome: Progressing Goal: Will not experience complications related to urinary retention Outcome: Progressing   Problem: Pain Managment: Goal: General experience of comfort will improve and/or be controlled Outcome: Progressing   Problem: Safety: Goal: Ability to remain free from injury will improve Outcome: Progressing   Problem: Skin Integrity: Goal: Risk for impaired skin integrity will decrease Outcome: Progressing

## 2023-07-22 DIAGNOSIS — G9340 Encephalopathy, unspecified: Secondary | ICD-10-CM | POA: Diagnosis not present

## 2023-07-22 DIAGNOSIS — N1832 Chronic kidney disease, stage 3b: Secondary | ICD-10-CM | POA: Diagnosis not present

## 2023-07-22 LAB — BASIC METABOLIC PANEL
Anion gap: 7 (ref 5–15)
BUN: 18 mg/dL (ref 8–23)
CO2: 20 mmol/L — ABNORMAL LOW (ref 22–32)
Calcium: 8.7 mg/dL — ABNORMAL LOW (ref 8.9–10.3)
Chloride: 108 mmol/L (ref 98–111)
Creatinine, Ser: 1.45 mg/dL — ABNORMAL HIGH (ref 0.61–1.24)
GFR, Estimated: 54 mL/min — ABNORMAL LOW (ref >60)
Glucose, Bld: 100 mg/dL — ABNORMAL HIGH (ref 70–99)
Potassium: 3.7 mmol/L (ref 3.5–5.1)
Sodium: 135 mmol/L (ref 135–145)

## 2023-07-22 LAB — CBC
HCT: 26.7 % — ABNORMAL LOW (ref 39.0–52.0)
Hemoglobin: 8.4 g/dL — ABNORMAL LOW (ref 13.0–17.0)
MCH: 28.9 pg (ref 26.0–34.0)
MCHC: 31.5 g/dL (ref 30.0–36.0)
MCV: 91.8 fL (ref 80.0–100.0)
Platelets: 136 10*3/uL — ABNORMAL LOW (ref 150–400)
RBC: 2.91 MIL/uL — ABNORMAL LOW (ref 4.22–5.81)
RDW: 16.8 % — ABNORMAL HIGH (ref 11.5–15.5)
WBC: 6 10*3/uL (ref 4.0–10.5)
nRBC: 0 % (ref 0.0–0.2)

## 2023-07-22 LAB — MAGNESIUM: Magnesium: 1.5 mg/dL — ABNORMAL LOW (ref 1.7–2.4)

## 2023-07-22 LAB — GLUCOSE, CAPILLARY
Glucose-Capillary: 94 mg/dL (ref 70–99)
Glucose-Capillary: 104 mg/dL — ABNORMAL HIGH (ref 70–99)
Glucose-Capillary: 144 mg/dL — ABNORMAL HIGH (ref 70–99)

## 2023-07-22 MED ORDER — DIVALPROEX SODIUM 250 MG PO DR TAB
250.0000 mg | DELAYED_RELEASE_TABLET | Freq: Three times a day (TID) | ORAL | Status: DC
  Administered 2023-07-22 (×3): 250 mg via ORAL
  Filled 2023-07-22 (×4): qty 1

## 2023-07-22 MED ORDER — QUETIAPINE FUMARATE 100 MG PO TABS: 800.0000 mg | ORAL_TABLET | Freq: Two times a day (BID) | ORAL | Status: DC

## 2023-07-22 MED ORDER — MAGNESIUM SULFATE 4 GM/100ML IV SOLN
4.0000 g | Freq: Once | INTRAVENOUS | Status: AC
  Administered 2023-07-22: 4 g via INTRAVENOUS
  Filled 2023-07-22: qty 100

## 2023-07-22 MED ORDER — QUETIAPINE FUMARATE 100 MG PO TABS
400.0000 mg | ORAL_TABLET | Freq: Two times a day (BID) | ORAL | Status: DC
  Administered 2023-07-22 – 2023-07-24 (×5): 400 mg via ORAL
  Filled 2023-07-22 (×5): qty 4

## 2023-07-22 MED ORDER — OLANZAPINE 5 MG PO TBDP
10.0000 mg | ORAL_TABLET | Freq: Once | ORAL | Status: AC
  Administered 2023-07-22: 10 mg via ORAL
  Filled 2023-07-22: qty 2

## 2023-07-22 NOTE — Progress Notes (Signed)
eLink Physician-Brief Progress Note Patient Name: CARVELL HOEFFNER DOB: [birth year] MRN: [id number]   Date of Service  07/22/2023  HPI/Events of Note  Patient with severe encephalopathy and agitated delirium.  Was in the ICU requiring transient Precedex infusion.  Had escalating doses of Haldol, Depakote, new Seroquel tonight.  QTc within normal limits.  Pain meds in place.   eICU Interventions  In addition to scheduled meds, he received his Haldol with essentially no benefit.  Will try Zyprexa disintegrating tab instead x 1.  If this is ineffective, the patient may need benzos for self safety.   34 - Add Ativan given his symptoms have been refractory to all antipsychotics.  Still danger to self trying to throw himself off of bed.  Add restraints.  May need to escalate dose of benzos but would rather go slow in the setting of concurrent opiate use.  Intervention Category Minor Interventions: Agitation / anxiety - evaluation and management  Revecca Nachtigal 07/22/2023, 11:13 PM

## 2023-07-22 NOTE — Progress Notes (Signed)
Pt transferred to 2W room 24, without any complication all belongings place at bedside.

## 2023-07-22 NOTE — Progress Notes (Signed)
NAME:  Gregory [name], MRN:  [id number], DOB:  Jun 12, 1961, LOS: 6 ADMISSION DATE:  07/15/2023, CONSULTATION DATE:  1/28 REFERRING MD:  Dr. Pilar Plate, CHIEF COMPLAINT:  Agitation Delirium   History of Present Illness:  Patient is a 63 year old male with pertinent PMH DMT2, HTN, HLD, CKD 3B, COPD, chronic osteomyelitis s/p R AKA, L midfoot amputation, stroke, A-fib, HFpEF, mesenteric ischemia s/p colostomy 12/2022 presents to [facility] ED on 1/27 with agitation delirium.  Patient recently admitted on 1/9-1/23 with septic shock and AMS secondary to ESBL UTI and Serratia/Klebsiella multifocal pneumonia/positive rhinovirus.  Had acute respiratory failure requiring intubation.  Patient is MRI also showing acute on chronic right PCA infarct.  On discharge patient fully alert and mostly oriented and was discharged to [facility] on Lyrica 125 mg 3 times daily and Seroquel 100 Mg nightly.  On 1/27 patient was throwing himself on the floor at [facility].  More confused and was brought to [facility] ED for further eval.  On arrival patient alert.  Vital stable and afebrile.  UA clear.  CXR improved opacities from prior cxr 1/18. WBC 11.1 and LA 0.6 glucose 104.  Ammonia and VBG unremarkable.  CT head with no evidence of acute abnormality.  Patient agitated requiring multiple medications oxy, Ativan, Versed, morphine.  Despite medications patient required Precedex.  PCCM consulted for ICU admission  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)    Acquired absence of right leg below knee (HCC)    Acute osteomyelitis of calcaneum, right (HCC) 10/02/2016   Acute respiratory failure (HCC)    Amputation stump infection (HCC) 08/12/2018   Anemia    Basal cell carcinoma, eyelid    Cancer (HCC)    Candidiasis 08/12/2018   CHF (congestive heart failure) (HCC)    chronic diastolic    Chronic back pain    Chronic kidney disease    stage 3    Chronic multifocal  osteomyelitis (HCC)    of ankle and foot    Chronic pain syndrome    COPD (chronic obstructive pulmonary disease) (HCC)    DDD (degenerative disc disease), lumbar    Depression    major depressive disorder    Diabetes mellitus without complication (HCC)    type 2    Diabetic ulcer of toe of left foot (HCC) 10/02/2016   Difficult intubation    Difficulty in walking, not elsewhere classified    Epidural abscess 10/02/2016   Foot drop, bilateral    Foot drop, right 12/27/2015   GERD (gastroesophageal reflux disease)    Herpesviral vesicular dermatitis    History of COVID-19 03/15/2022   Hyperlipidemia    Hyperlipidemia    Hypertension    Insomnia    Malignant melanoma of other parts of face (HCC)    Melanoma (HCC)    MRSA bacteremia    Muscle weakness (generalized)    Nasal congestion    Necrosis of toe (HCC) 07/08/2018   Neuromuscular dysfunction of bladder    Osteomyelitis (HCC)    Osteomyelitis (HCC)    Osteomyelitis (HCC)    right BKA   Other idiopathic peripheral autonomic neuropathy    Retention of urine, unspecified    Squamous cell carcinoma of skin    Stroke (HCC)    Unsteadiness on feet    Urinary retention    Urinary retention    Urinary tract infection    Wears dentures  Wears glasses      Significant Hospital Events: Including procedures, antibiotic start and stop dates in addition to other pertinent events   1/28 agitation delirium requiring Precedex; PCCM consulted 1/29 precedex d/c'd  Interim History / Subjective:  Screaming out, ate breakfast okay. Wants more food and to ambulate  Objective   Blood pressure (!) 145/80, pulse 67, temperature (!) 97.4 F (36.3 C), temperature source Axillary, resp. rate 12, height 6' (1.829 m), weight 82.2 kg, SpO2 100%.        Intake/Output Summary (Last 24 hours) at 07/22/2023 0900 Last data filed at 07/22/2023 1610 Gross per 24 hour  Intake 1200 ml  Output 2000 ml  Net -800 ml   Filed Weights    07/20/23 0500 07/21/23 0500 07/22/23 0343  Weight: 83.4 kg 82.2 kg 82.2 kg    Examination: Nontoxic appearing Confused but makes sense when he is making demands Non-labored breathing pattern Yelling pretty welll  Assessment & Plan:   Acute encephalopathy/agitation delirium:  pain is now controlled, remaining issues is primarily confusion and agitation COPD HFrEF HTN HLD T2DM CKD 3B Chronic pain Depression/anxiety HypoMg- repleting  - Continue dilaudid, lyrica 75 BID, lexapro, and PRN dilaudid; increase seroquel again and start depakote - complete high dose thiamine - encourage PO  - re-orient PRN, encourage day/night cycles - back pain improved, plts okay, I think we do not need to re-image spine - stable for return to SNF at any point if they can handle the behavior  Myrla Halsted MD PCCM

## 2023-07-23 DIAGNOSIS — G9340 Encephalopathy, unspecified: Secondary | ICD-10-CM | POA: Diagnosis not present

## 2023-07-23 LAB — BASIC METABOLIC PANEL
Anion gap: 11 (ref 5–15)
BUN: 18 mg/dL (ref 8–23)
CO2: 22 mmol/L (ref 22–32)
Calcium: 9.2 mg/dL (ref 8.9–10.3)
Chloride: 105 mmol/L (ref 98–111)
Creatinine, Ser: 1.52 mg/dL — ABNORMAL HIGH (ref 0.61–1.24)
GFR, Estimated: 51 mL/min — ABNORMAL LOW (ref >60)
Glucose, Bld: 110 mg/dL — ABNORMAL HIGH (ref 70–99)
Potassium: 4.9 mmol/L (ref 3.5–5.1)
Sodium: 138 mmol/L (ref 135–145)

## 2023-07-23 LAB — GLUCOSE, CAPILLARY
Glucose-Capillary: 100 mg/dL — ABNORMAL HIGH (ref 70–99)
Glucose-Capillary: 120 mg/dL — ABNORMAL HIGH (ref 70–99)
Glucose-Capillary: 141 mg/dL — ABNORMAL HIGH (ref 70–99)
Glucose-Capillary: 145 mg/dL — ABNORMAL HIGH (ref 70–99)
Glucose-Capillary: 147 mg/dL — ABNORMAL HIGH (ref 70–99)
Glucose-Capillary: 99 mg/dL (ref 70–99)

## 2023-07-23 LAB — MAGNESIUM: Magnesium: 2 mg/dL (ref 1.7–2.4)

## 2023-07-23 MED ORDER — DIVALPROEX SODIUM 500 MG PO DR TAB
500.0000 mg | DELAYED_RELEASE_TABLET | Freq: Three times a day (TID) | ORAL | Status: DC
Start: 2023-07-23 — End: 2023-08-01
  Administered 2023-07-23 – 2023-08-01 (×23): 500 mg via ORAL
  Filled 2023-07-23 (×30): qty 1

## 2023-07-23 MED ORDER — HYDROMORPHONE HCL 2 MG PO TABS
2.0000 mg | ORAL_TABLET | Freq: Once | ORAL | Status: AC
  Administered 2023-07-23: 2 mg via ORAL

## 2023-07-23 MED ORDER — LORAZEPAM 2 MG/ML IJ SOLN
1.0000 mg | INTRAMUSCULAR | Status: DC | PRN
  Filled 2023-07-23: qty 1

## 2023-07-23 MED ORDER — PREGABALIN 100 MG PO CAPS
200.0000 mg | ORAL_CAPSULE | Freq: Two times a day (BID) | ORAL | Status: DC
Start: 2023-07-23 — End: 2023-07-30
  Administered 2023-07-23 – 2023-07-30 (×14): 200 mg via ORAL
  Filled 2023-07-23 (×16): qty 2

## 2023-07-23 MED ORDER — CLONAZEPAM 1 MG PO TABS
1.0000 mg | ORAL_TABLET | Freq: Two times a day (BID) | ORAL | Status: DC
  Administered 2023-07-23 – 2023-07-24 (×3): 1 mg via ORAL
  Filled 2023-07-23 (×3): qty 1

## 2023-07-23 MED ORDER — LORAZEPAM 2 MG/ML IJ SOLN
1.0000 mg | INTRAMUSCULAR | Status: AC | PRN
  Administered 2023-07-23 – 2023-07-25 (×3): 1 mg via INTRAVENOUS
  Filled 2023-07-23 (×2): qty 1

## 2023-07-23 NOTE — Progress Notes (Signed)
 Physical Therapy Treatment Patient Details Name: Gregory [name] MRN: [id number] DOB: 1960-12-01 Today's Date: 07/23/2023   History of Present Illness 63 yo male from [place] admitted 1/27 with AMS; Acute encephalopathy/agitation delirium. 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    PT Comments  Treatment and progression limited by fatigue/lethargy suspect from medications. Requires frequent cues to remain alert and focused on task at hand. Max assist to rise to EOB. Unable to successfully scoot or squat pivot along bed. Able to bridge and pull through UEs to reposition in bed with max assist. He verbally denies any pain. No obvious symptoms of discomfort with mobility this morning. Repositioned in chair mode in bed, bil wrist restraints re-applied at end of session. Patient will continue to benefit from skilled physical therapy services to further improve independence with functional mobility. Patient will benefit from continued inpatient follow up therapy, <3 hours/day.    If plan is discharge home, recommend the following: Two people to help with walking and/or transfers;Assistance with cooking/housework;Direct supervision/assist for medications management;Direct supervision/assist for financial management;Assist for transportation;Supervision due to cognitive status;Two people to help with bathing/dressing/bathroom   Can travel by private vehicle     No  Equipment Recommendations  None recommended by PT    Recommendations for Other Services       Precautions / Restrictions Precautions Precautions: Fall Required Braces or Orthoses:  (no prosthesis) Restrictions Weight Bearing Restrictions Per Provider Order: No     Mobility   Bed Mobility Overal bed mobility: Needs Assistance Bed Mobility: Rolling, Sidelying to Sit, Sit to Sidelying Rolling: Mod assist Sidelying to sit: Max assist, HOB elevated     Sit to sidelying: Max assist General bed mobility comments: Very lethargic, requires frequent cues to facilitate and enage with desired movement. Mod assist to roll Lt and Rt. Max assist to rise to EOB and lower again. Max assist to scoot up in bed from supine, bridges and pull through UEs on rails.    Transfers                   General transfer comment: Not safe at this time, needs to be more alert.    Ambulation/Gait                   Stairs             Wheelchair Mobility     Tilt Bed    Modified Rankin (Stroke Patients Only)       Balance Overall balance assessment: Needs assistance Sitting-balance support: No upper extremity supported, Feet supported Sitting balance-Leahy Scale: Poor Sitting balance - Comments: Min-mod assit for balance EOB Postural control: Posterior lean     Standing balance comment: deferred                            Cognition Arousal: Lethargic Behavior During Therapy: Flat affect Overall Cognitive Status: No family/caregiver present to determine baseline cognitive functioning                                 General Comments: Requires multimodal cues to facilitate. Following about 25% of simple single step commands.  Easily nods off asleep.        Exercises      General Comments General comments (skin integrity, edema, etc.): Attempted several lateral scoots/squat pivot along bed but unable at this time.      Pertinent Vitals/Pain Pain Assessment Pain Assessment: No/denies pain    Home Living                          Prior Function            PT Goals (current goals can now be found in the care plan section) Acute Rehab PT Goals Patient Stated Goal: unable to state PT Goal Formulation:  Patient unable to participate in goal setting Time For Goal Achievement: 07/31/23 Potential to Achieve Goals: Poor Progress towards PT goals: Progressing toward goals    Frequency    Min 1X/week      PT Plan      Co-evaluation              AM-PAC PT 6 Clicks Mobility   Outcome Measure  Help needed turning from your back to your side while in a flat bed without using bedrails?: A Little Help needed moving from lying on your back to sitting on the side of a flat bed without using bedrails?: A Lot Help needed moving to and from a bed to a chair (including a wheelchair)?: Total Help needed standing up from a chair using your arms (e.g., wheelchair or bedside chair)?: Total Help needed to walk in hospital room?: Total Help needed climbing 3-5 steps with a railing? : Total 6 Click Score: 9    End of Session Equipment Utilized During Treatment: Gait belt Activity Tolerance: Patient limited by fatigue;Patient limited by lethargy Patient left: in bed;with call bell/phone within reach;with bed alarm set;with restraints reapplied (wrists)   PT Visit Diagnosis: Muscle weakness (generalized) (M62.81);Difficulty in walking, not elsewhere classified (R26.2);Other symptoms and signs involving the nervous system (R29.898)     Time: 9064-9048 PT Time Calculation (min) (ACUTE ONLY): 16 min  Charges:    $Therapeutic Activity: 8-22 mins PT General Charges $$ ACUTE PT VISIT: 1 Visit                     Leontine Roads, PT, DPT [facility]  Rehabilitation Services Physical Therapist Office: [phone] Website: [email or web address]    Leontine GORMAN Roads 07/23/2023, 10:46 AM

## 2023-07-23 NOTE — Plan of Care (Signed)
RESTRAINT  Problem: Safety: Goal: Non-violent Restraint(s) Outcome: Not Progressing   Problem: Clinical Measurements: Goal: Ability to maintain clinical measurements within normal limits will improve Outcome: Progressing

## 2023-07-23 NOTE — Progress Notes (Signed)
 Agitated, verbally aggressing, trying to get out of bed, pulling lines.  Being put into restraints now.  He denies complaints to me and is pleasant as I ask questions.    Increase depakote  dose Increase lyrica  dose Add clonazepam  BID  Leita SHAUNNA Gaskins, DO 07/23/23 1:29 AM [facility]  For contact information, see Amion. If no response to pager, please call PCCM consult pager. After hours, 7PM- 7AM, please call Elink.

## 2023-07-23 NOTE — Progress Notes (Signed)
 PROGRESS NOTE    Gregory [name]  FMW:[id number] DOB: [birth year] DOA: 07/15/2023 PCP: [name], Provider, MD  Outpatient Specialists:     Brief Narrative:  Patient is a 63 year old male with multiple medical and cardiac history.  Patient carries diagnosis of diabetes mellitus, hypertension, hyperlipidemia, chronic kidney disease stage IIIb, COPD, chronic osteomyelitis status post right TKA, left midfoot amputation, stroke, A-fib, heart failure with preserved ejection fraction, and mesenteric ischemia status post colostomy.  Patient was recently admitted and discharged from this hospital.  Patient was readmitted with severe agitation and delirium.  Procalcitonin was less than 0.1 on presentation.  Negative blood culture.  CT head did not reveal any acute abnormalities.  Patient was initially admitted to ICU team, managed with Precedex .  Patient has been transferred to [facility] today, 07/23/2023.  07/23/2023: Patient seen.  Patient is sedated.  No history from patient.   Assessment & Plan:   Principal Problem:   Acute encephalopathy   Acute encephalopathy/agitation delirium:   -Etiology remains unclear. -Negative blood cultures. -Procalcitonin of less than 0.1 on presentation. -Negative CT head. -Patient is currently on Klonopin  0.25 Mg dissolving tablet twice daily, Depakote  500 Mg p.o. every 8 hours, IV hydromorphone  as needed, pregabalin  200 Mg p.o. twice daily, Seroquel  400 Mg twice daily and IV Haldol  as needed. -Query need to proceed with EEG. -Low threshold to consult the psychiatry team.   COPD -Continue Brovana , budesonide  and DuoNeb as needed. -Stable.  HFrEF: -Seems compensated.  Hypertension:  -Controlled.  Hyperlipidemia:  -Continue atorvastatin .  T2DM: -Blood sugars controlled.  CKD 3B: -Stable.  Chronic pain: -Continue to optimize.  Depression/anxiety: -Continue Lexapro . -Patient is also on Klonopin .   Hypomagnesemia: -Magnesium  of 2 today. -Continue to  monitor and replete.      DVT prophylaxis: Subcutaneous heparin  5000 units every 8 hourly. Code Status: Full code. Family Communication:  Disposition Plan: Patient remains inpatient.   Consultants:  Patient was transferred from ICU team.  Procedures:  None.  Antimicrobials:  None.   Subjective: No history from patient.  Objective: Vitals:   07/23/23 0650 07/23/23 0650 07/23/23 0822 07/23/23 1458  BP: (!) 147/66 (!) 147/66 (!) 110/93 105/64  Pulse:  (!) 57 (!) 54 (!) 55  Resp:   19 16  Temp:    98.6 F (37 C)  TempSrc:   Axillary Axillary  SpO2:  99% 98% 97%  Weight:      Height:        Intake/Output Summary (Last 24 hours) at 07/23/2023 1643 Last data filed at 07/23/2023 1044 Gross per 24 hour  Intake 240 ml  Output 2125 ml  Net -1885 ml   Filed Weights   07/21/23 0500 07/22/23 0343 07/23/23 0400  Weight: 82.2 kg 82.2 kg 85.6 kg    Examination:  General exam: Seems sedated.    Respiratory system: Clear to auscultation.  Cardiovascular system: S1 & S2, soft systolic murmur and increased intensity of S2 component of the heart sound.   Gastrointestinal system: Abdomen is soft and nontender.  Central nervous system: Sedated. Extremities: Status post right TKA.  Left forefoot amputation.  Data Reviewed: I have personally reviewed following labs and imaging studies  CBC: Recent Labs  Lab 07/18/23 0325 07/19/23 1144 07/20/23 0447 07/21/23 0334 07/22/23 0218  WBC 3.6* 4.5 4.7 4.0 6.0  NEUTROABS 1.8  --   --   --   --   HGB 9.5* 8.3* 8.9* 8.2* 8.4*  HCT 29.0* 26.1* 28.0* 25.4* 26.7*  MCV 89.0 90.6 90.6  90.7 91.8  PLT 137* 138* 111* 126* 136*   Basic Metabolic Panel: Recent Labs  Lab 07/18/23 0325 07/19/23 1144 07/20/23 0447 07/21/23 0334 07/22/23 0218 07/23/23 1045  NA 137 136 139 139 135 138  K 3.5 4.1 3.9 3.6 3.7 4.9  CL 110 109 110 112* 108 105  CO2 16* 17* 19* 19* 20* 22  GLUCOSE 124* 161* 124* 111* 100* 110*  BUN 40* 34* 29* 23 18 18    CREATININE 2.04* 1.54* 1.56* 1.58* 1.45* 1.52*  CALCIUM  8.9 8.9 9.1 8.6* 8.7* 9.2  MG 1.8 1.7  --  1.4* 1.5* 2.0   GFR: Estimated Creatinine Clearance: 53.9 mL/min (A) (by C-G formula based on SCr of 1.52 mg/dL (H)). Liver Function Tests: No results for input(s): AST, ALT, ALKPHOS, BILITOT, PROT, ALBUMIN  in the last 168 hours. No results for input(s): LIPASE, AMYLASE in the last 168 hours. Recent Labs  Lab 07/19/23 1144  AMMONIA 28   Coagulation Profile: No results for input(s): INR, PROTIME in the last 168 hours. Cardiac Enzymes: No results for input(s): CKTOTAL, CKMB, CKMBINDEX, TROPONINI in the last 168 hours. BNP (last 3 results) No results for input(s): PROBNP in the last 8760 hours. HbA1C: No results for input(s): HGBA1C in the last 72 hours. CBG: Recent Labs  Lab 07/22/23 1138 07/23/23 0008 07/23/23 0401 07/23/23 0738 07/23/23 1248  GLUCAP 144* 120* 141* 145* 99   Lipid Profile: No results for input(s): CHOL, HDL, LDLCALC, TRIG, CHOLHDL, LDLDIRECT in the last 72 hours. Thyroid Function Tests: No results for input(s): TSH, T4TOTAL, FREET4, T3FREE, THYROIDAB in the last 72 hours. Anemia Panel: No results for input(s): VITAMINB12, FOLATE, FERRITIN, TIBC, IRON, RETICCTPCT in the last 72 hours. Urine analysis:    Component Value Date/Time   COLORURINE YELLOW 07/15/2023 2022   APPEARANCEUR CLEAR 07/15/2023 2022   LABSPEC 1.012 07/15/2023 2022   PHURINE 5.0 07/15/2023 2022   GLUCOSEU NEGATIVE 07/15/2023 2022   HGBUR NEGATIVE 07/15/2023 2022   BILIRUBINUR NEGATIVE 07/15/2023 2022   KETONESUR NEGATIVE 07/15/2023 2022   PROTEINUR 30 (A) 07/15/2023 2022   UROBILINOGEN 1.0 02/03/2014 0052   NITRITE NEGATIVE 07/15/2023 2022   LEUKOCYTESUR NEGATIVE 07/15/2023 2022   Sepsis Labs: @LABRCNTIP (procalcitonin:4,lacticidven:4)  ) Recent Results (from the past 240 hours)  Blood culture (routine x 2)      Status: None   Collection Time: 07/15/23  7:06 PM   Specimen: BLOOD  Result Value Ref Range Status   Specimen Description BLOOD LEFT ANTECUBITAL  Final   Special Requests   Final    BOTTLES DRAWN AEROBIC AND ANAEROBIC Blood Culture adequate volume   Culture   Final    NO GROWTH 5 DAYS Performed at [facility], [address], [place], [place] [address]    Report Status 07/20/2023 FINAL  Final  Blood culture (routine x 2)     Status: None   Collection Time: 07/15/23  8:46 PM   Specimen: BLOOD LEFT HAND  Result Value Ref Range Status   Specimen Description BLOOD LEFT HAND  Final   Special Requests   Final    BOTTLES DRAWN AEROBIC ONLY Blood Culture results may not be optimal due to an inadequate volume of blood received in culture bottles   Culture   Final    NO GROWTH 5 DAYS Performed at [facility], [address], [place], [place] [address]    Report Status 07/20/2023 FINAL  Final  Respiratory (~20 pathogens) panel by PCR  Status: None   Collection Time: 07/16/23  5:16 AM   Specimen: Nasopharyngeal Swab; Respiratory  Result Value Ref Range Status   Adenovirus NOT DETECTED NOT DETECTED Final   Coronavirus 229E NOT DETECTED NOT DETECTED Final    Comment: (NOTE) The Coronavirus on the Respiratory Panel, DOES NOT test for the novel  Coronavirus (2019 nCoV)    Coronavirus HKU1 NOT DETECTED NOT DETECTED Final   Coronavirus NL63 NOT DETECTED NOT DETECTED Final   Coronavirus OC43 NOT DETECTED NOT DETECTED Final   Metapneumovirus NOT DETECTED NOT DETECTED Final   Rhinovirus / Enterovirus NOT DETECTED NOT DETECTED Final   Influenza A NOT DETECTED NOT DETECTED Final   Influenza B NOT DETECTED NOT DETECTED Final   Parainfluenza Virus 1 NOT DETECTED NOT DETECTED Final   Parainfluenza Virus 2 NOT DETECTED NOT DETECTED Final   Parainfluenza Virus 3 NOT DETECTED NOT DETECTED Final   Parainfluenza Virus 4 NOT DETECTED NOT DETECTED Final   Respiratory Syncytial Virus NOT  DETECTED NOT DETECTED Final   Bordetella pertussis NOT DETECTED NOT DETECTED Final   Bordetella Parapertussis NOT DETECTED NOT DETECTED Final   Chlamydophila pneumoniae NOT DETECTED NOT DETECTED Final   Mycoplasma pneumoniae NOT DETECTED NOT DETECTED Final    Comment: Performed at [facility], [address] [place], [place] [address]  MRSA Next Gen by PCR, Nasal     Status: Abnormal   Collection Time: 07/16/23  6:53 AM   Specimen: Nasal Mucosa; Nasal Swab  Result Value Ref Range Status   MRSA by PCR Next Gen DETECTED (A) NOT DETECTED Final    Comment: RESULT CALLED TO, READ BACK BY AND VERIFIED WITH: RN b. butner [id number] FCP (NOTE) The GeneXpert MRSA Assay (FDA approved for NASAL specimens only), is one component of a comprehensive MRSA colonization surveillance program. It is not intended to diagnose MRSA infection nor to guide or monitor treatment for MRSA infections. Test performance is not FDA approved in patients less than 92 years old. Performed at [facility], [address] [place], [place] [address]          Radiology Studies: No results found.      Scheduled Meds:  acetaminophen   650 mg Oral Q6H   arformoterol   15 mcg Nebulization BID   aspirin  EC  81 mg Oral Daily   atorvastatin   80 mg Oral QHS   budesonide  (PULMICORT ) nebulizer solution  0.5 mg Nebulization BID   carvedilol   12.5 mg Oral BID WC   Chlorhexidine  Gluconate Cloth  6 each Topical Daily   clonazePAM   1 mg Oral BID   divalproex   500 mg Oral Q8H   escitalopram   20 mg Oral Daily   famotidine   20 mg Oral Daily   feeding supplement  237 mL Oral BID BM   haloperidol  lactate  5 mg Intramuscular Once   heparin   5,000 Units Subcutaneous Q8H   hydrALAZINE   10 mg Oral Q8H   HYDROmorphone   2 mg Oral Q6H   pantoprazole   40 mg Oral Daily   pregabalin   200 mg Oral BID   QUEtiapine   400 mg Oral BID   revefenacin   175 mcg Nebulization Daily   sodium bicarbonate   650 mg Oral BID    thiamine   100 mg Oral Daily   Continuous Infusions:   LOS: 7 days    Time spent: 35 minutes    Leatrice Chapel, MD  [facility] Pager #: [phone] 7PM-7AM contact night coverage as  above

## 2023-07-23 NOTE — Progress Notes (Signed)
 Notified MD in regards to pt with increased lethargy, BP taken 105/64 Map 74, oxygen on RA 97% resp. 16 , BS 100, Pt responds to sternal rub , upon getting blood sugar pt had no reaction to finger stick. MD requested Bedside to  further evaluate. Md aware of medications being held .  Per MD recommendation monitor BS q4 no new orders placed.

## 2023-07-24 DIAGNOSIS — G9340 Encephalopathy, unspecified: Secondary | ICD-10-CM | POA: Diagnosis not present

## 2023-07-24 LAB — BASIC METABOLIC PANEL
Anion gap: 10 (ref 5–15)
BUN: 16 mg/dL (ref 8–23)
CO2: 19 mmol/L — ABNORMAL LOW (ref 22–32)
Calcium: 8.8 mg/dL — ABNORMAL LOW (ref 8.9–10.3)
Chloride: 109 mmol/L (ref 98–111)
Creatinine, Ser: 1.42 mg/dL — ABNORMAL HIGH (ref 0.61–1.24)
GFR, Estimated: 55 mL/min — ABNORMAL LOW (ref >60)
Glucose, Bld: 95 mg/dL (ref 70–99)
Potassium: 4.8 mmol/L (ref 3.5–5.1)
Sodium: 138 mmol/L (ref 135–145)

## 2023-07-24 LAB — GLUCOSE, CAPILLARY
Glucose-Capillary: 118 mg/dL — ABNORMAL HIGH (ref 70–99)
Glucose-Capillary: 126 mg/dL — ABNORMAL HIGH (ref 70–99)
Glucose-Capillary: 144 mg/dL — ABNORMAL HIGH (ref 70–99)
Glucose-Capillary: 90 mg/dL (ref 70–99)
Glucose-Capillary: 98 mg/dL (ref 70–99)

## 2023-07-24 LAB — MAGNESIUM: Magnesium: 1.8 mg/dL (ref 1.7–2.4)

## 2023-07-24 MED ORDER — QUETIAPINE FUMARATE 100 MG PO TABS
400.0000 mg | ORAL_TABLET | Freq: Every day | ORAL | Status: DC
  Administered 2023-07-24 – 2023-07-26 (×3): 400 mg via ORAL
  Filled 2023-07-24 (×3): qty 4

## 2023-07-24 NOTE — Consult Note (Signed)
 [facility] Psychiatric Consult Initial  Patient Name: .Gregory [name]  MRN: [id number]  DOB: 1960-12-13  Consult Order details:  Orders (From admission, onward)     Start     Ordered   07/23/23 1704  IP CONSULT TO PSYCHIATRY       Ordering Provider: Rosario Leatrice FERNS, MD  Provider:  (Not yet assigned)  Question Answer Comment  Location [facility]   Reason for Consult? Severe agitation      07/23/23 1703            Mode of Visit: In person   Psychiatry Consult Evaluation  Service Date: July 24, 2023 LOS:  LOS: 8 days  Chief Complaint I'm in pain everywhere  Primary Psychiatric Diagnoses  Hyperactive delirium  2.  Somatic symptom disorder with predominant pain 3. History of MDD   Assessment  Gregory [name] is a 63 y.o. male admitted: Medicallyfor 07/15/2023  3:05 PM for acute agitated encephalopathy. 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   His current presentation of inattention, waxing and waning mental status, change over the last couple of days is most consistent with hyperactive delirium likely in the setting of multiple medications and uncontrolled pain. There is suspicion for somatic symptom disorder with predominant pain given chart review notable for multiple outpatient visits to chronic pain physicians and continued use of oxy throughout the years though notably nothing on PDMP. Main psychiatric diagnosis over the years has been depression/anxiety and patient and collateral denies any past psychiatric hospitalizations or suicide attempts. He reports depression but is unable to state what medications he has been on and whether or not they have helped. Current outpatient psychotropic medications from last admission included scheduled lexapro   20, oxy 15 TID, lyrica  25 TID, 100 BID, seroquel  100 at bedtime, PRN haldol  5 Q6H, oxy 5 Q6H and historically he was fully alert and mostly oriented on these medications. He was likely compliant with medications prior to admission since he was at the nursing home. On initial examination, patient is alert, irritable, is not able to participate in sustained attention testing and is reporting pain. Will recommend decreasing seroquel , stopping klonopin , continuing depakote , making sure his pain is well controlled. I suspect that he has a low frustration tolerance with regards to his pain which contributes to agitation. Please see plan below for detailed recommendations.   Diagnoses:  Active Hospital problems: Principal Problem:   Acute encephalopathy    Plan   ## Psychiatric Medication Recommendations:  --decrease seroquel  to 400 QHS for mood stabilization and insomnia given continued somnolence and concern for polypharmacy --continue lexapro  20 daily for depression  --stop klonopin  1 BID for agitation  --continue depakote  500 Q8H for mood stabilization, can obtain depakote  level in a couple days if patient continues on medication and is still in the hospital --optimize pain regimen   ## Medical Decision Making Capacity: Not specifically addressed in this encounter  ## Further Work-up:  --B12, folate  -- most recent EKG on 2/3 had QtC of 413 -- Pertinent labwork reviewed earlier this admission includes: CMP (Cr 1.52), CBC, TSH, UDS  ## Disposition:-- There are no psychiatric contraindications to discharge at this time  ## Behavioral / Environmental: -Delirium Precautions: Delirium Interventions for Nursing and Staff: - RN to  open blinds every AM. - To Bedside: Glasses, hearing aide, and pt's own shoes. Make available to patients. when possible and encourage use. - Encourage po fluids when appropriate, keep fluids within reach. - OOB to chair with meals. - Passive ROM exercises to all  extremities with AM & PM care. - RN to assess orientation to person, time and place QAM and PRN. - Recommend extended visitation hours with familiar family/friends as feasible. - Staff to minimize disturbances at night. Turn off television when pt asleep or when not in use.   ## Safety and Observation Level:  - Based on my clinical evaluation, I estimate the patient to be at moderate risk of self harm in the current setting due to agitation. - At this time, we recommend  safety observation. This decision is based on my review of the chart including patient's history and current presentation, interview of the patient, mental status examination, and consideration of suicide risk including evaluating suicidal ideation, plan, intent, suicidal or self-harm behaviors, risk factors, and protective factors. This judgment is based on our ability to directly address suicide risk, implement suicide prevention strategies, and develop a safety plan while the patient is in the clinical setting. Please contact our team if there is a concern that risk level has changed.  CSSR Risk Category:C-SSRS RISK CATEGORY: No Risk  Suicide Risk Assessment: Patient has following modifiable risk factors for suicide: recklessness, which we are addressing by optimizing his medications Patient has following non-modifiable or demographic risk factors for suicide: male gender Patient has the following protective factors against suicide: Supportive family, no history of suicide attempts, and no history of NSSIB  Thank you for this consult request. Recommendations have been communicated to the primary team.  We will continue to follow at this time.   Corean Minor, MD, PGY-2       History of Present Illness  Relevant Aspects of [facility] Course:  Admitted on 07/15/2023 for acute agitated encephalopathy.  -Required precedex   -Acute encephalopathy thought to be sepsis related ?AKI vs CKD   ON events:  -taken off  restraints this AM  Patient Report:  Patient is alert and oriented to self, place, year, and month. He does not sustain attention for formal testing and is easily distracted throughout the interview. Throughout the interview he perseverates on getting ice for his cup and then using his foley to go to the bathroom. When he is not getting the ice for his cup in a timely manner he starts to bang on the side of his hospital bed. He does report feeling depressed all the time. Does not endorse symptoms of manic episode. When asked why he is in the hospital he reports it is due to diabetes and then he goes on to do other actions such as grabbing his juice or milk carton. He denies SI/HI. Reports that he is in pain in his hip and his back and is currently in pain.   Psych ROS:  Depression: reports always had depression Anxiety: reports anxiety  Mania (lifetime and current): denies  Psychosis: (lifetime and current): none  Collateral information:  Attempted to contact daughter [name] at [phone], no response She reports when he was discharged from last hospitalization, he was confused but pleasant and calm. Was with him when he was admitted to hospital. States he is not himself. States his agitation beforehand is if they were taking a long time to get his medication. He was more confused and didn't understand situation. Called her this  morning and was with him last night, called her at 9pm. He was asking if she was coming to pick him up. He seemed confused about where he was last night. He seems a little more confused. Prior to admission, he was falling on the floor, trying to pee everywhere. Was not realizing where he was. On Monday nursing home told her he kept on falling in the floor. He didn't call her as he usually does. Still occasionally staring off when she was with him in the hospital. Trying to feed himself but weak. Held his pain medication. She reports she felt like he was a zombie yesterday.  Sometimes need xanax  when anxious. Yesterday he was telling her to clean up ice cream on floor. She thought perhaps he was bipolar. He can flip a switch really quickly, gets agitated easily. If he doesn't get what he wants he gets really mad, has always been like that. He's been a functional adult until his health declined. Struggles with anxiety and depression from his health decline. Reports only psychotropic she is aware that he used to take was lexapro  before all of his medical conditions.   Review of Systems  Constitutional:  Positive for malaise/fatigue.  Psychiatric/Behavioral:  Negative for hallucinations and suicidal ideas. The patient is nervous/anxious.      Psychiatric and Social History  Psychiatric History:  Information collected from patient, chart review  Prev Dx/Sx: MDD Current Psych Provider: none Home Meds (current): scheduled lexapro  20, oxy 15 TID, lyrica  25 TID, 100 BID, seroquel  100 at bedtime, PRN haldol  5 Q6H, oxy 5 Q6H Previous Med Trials: wellbutrin , cymbalta , gabapentin  Therapy: unknown  Prior Psych Hospitalization: denies  Prior Self Harm: denies Prior Violence: denies  Family Psych History: denies, reports sister struggles with substance use  Family Hx suicide: unknown  Social History:  Living Situation: Lives at [facility] for several years, no access to anything else  Access to weapons/lethal means: none   Substance History Denies substance use history, UDS+opiates, BZD. Patient required ativan , versed .  -In the past he used marijuana frequently, but then became dependent on pain meds   Exam Findings  Vital Signs:  Temp:  [98 F (36.7 C)-98.6 F (37 C)] 98 F (36.7 C) (02/05 0931) Pulse Rate:  [55-60] 60 (02/05 0931) Resp:  [16-17] 17 (02/05 0931) BP: (105-133)/(63-84) 107/84 (02/05 0931) SpO2:  [97 %-98 %] 97 % (02/05 0300) Weight:  [84.9 kg] 84.9 kg (02/05 0500) Blood pressure 107/84, pulse 60, temperature 98 F (36.7 C), temperature  source Axillary, resp. rate 17, height 6' (1.829 m), weight 84.9 kg, SpO2 97%. Body mass index is 25.38 kg/m.  Physical Exam Constitutional:      Appearance: He is obese.  HENT:     Head: Normocephalic.  Pulmonary:     Effort: Pulmonary effort is normal.  Neurological:     Mental Status: He is alert.     Comments: Oriented to self, year, and hospital.     Mental Status Exam: General Appearance: Casual  Orientation:  Other:  oriented to self, year, and hospital  Memory:  Immediate;   Poor  Concentration:  Concentration: Poor  Recall:  Poor  Attention  Poor  Eye Contact:  Fair  Speech:  Clear  Language:  Fair  Volume:  Increased  Mood: I'm in pain, the nurse has not brought me my ice for my juice  Affect:  Congruent, Labile  Thought Process:  Goal Directed  Thought Content:  Rumination  Suicidal Thoughts:  No  Homicidal Thoughts:  No  Judgement:  Poor  Insight:  Shallow  Psychomotor Activity:  Restlessness  Akathisia:  No  Fund of Knowledge:  Fair      Assets:  Communication Skills  Cognition:  Impaired,  Mild  ADL's:  Impaired  AIMS (if indicated):        Other History   These have been pulled in through the EMR, reviewed, and updated if appropriate.  Family History:  The patient's family history includes Bone cancer in his father; Cancer in his father, mother, paternal grandfather, and another family member; Diabetes in his mother and another family member; Heart disease in his mother; Prostate cancer in his father.  Medical History: 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)    Acquired absence of right leg below knee (HCC)    Acute osteomyelitis of calcaneum, right (HCC) 10/02/2016   Acute respiratory failure (HCC)    Amputation stump infection (HCC) 08/12/2018   Anemia    Basal cell carcinoma, eyelid    Cancer (HCC)    Candidiasis 08/12/2018   CHF (congestive heart failure) (HCC)    chronic diastolic    Chronic  back pain    Chronic kidney disease    stage 3    Chronic multifocal osteomyelitis (HCC)    of ankle and foot    Chronic pain syndrome    COPD (chronic obstructive pulmonary disease) (HCC)    DDD (degenerative disc disease), lumbar    Depression    major depressive disorder    Diabetes mellitus without complication (HCC)    type 2    Diabetic ulcer of toe of left foot (HCC) 10/02/2016   Difficult intubation    Difficulty in walking, not elsewhere classified    Epidural abscess 10/02/2016   Foot drop, bilateral    Foot drop, right 12/27/2015   GERD (gastroesophageal reflux disease)    Herpesviral vesicular dermatitis    History of COVID-19 03/15/2022   Hyperlipidemia    Hyperlipidemia    Hypertension    Insomnia    Malignant melanoma of other parts of face (HCC)    Melanoma (HCC)    MRSA bacteremia    Muscle weakness (generalized)    Nasal congestion    Necrosis of toe (HCC) 07/08/2018   Neuromuscular dysfunction of bladder    Osteomyelitis (HCC)    Osteomyelitis (HCC)    Osteomyelitis (HCC)    right BKA   Other idiopathic peripheral autonomic neuropathy    Retention of urine, unspecified    Squamous cell carcinoma of skin    Stroke (HCC)    Unsteadiness on feet    Urinary retention    Urinary retention    Urinary tract infection    Wears dentures    Wears glasses     Surgical History: Past Surgical History:  Procedure Laterality Date   AMPUTATION Left 03/29/2017   Procedure: LEFT GREAT TOE AMPUTATION AT METATARSOPHALANGEAL JOINT;  Surgeon: Harden Jerona GAILS, MD;  Location: [facility] OR;  Service: Orthopedics;  Laterality: Left;   AMPUTATION Right 03/29/2017   Procedure: RIGHT BELOW KNEE AMPUTATION;  Surgeon: Harden Jerona GAILS, MD;  Location: [facility] OR;  Service: Orthopedics;  Laterality: Right;   AMPUTATION Left 06/06/2018   Procedure: LEFT 2ND TOE AMPUTATION;  Surgeon: Harden Jerona GAILS, MD;  Location: [facility] OR;  Service: Orthopedics;  Laterality: Left;   AMPUTATION Right 11/19/2018    Procedure: AMPUTATION ABOVE KNEE;  Surgeon: Harden Jerona  V, MD;  Location: [facility] OR;  Service: Orthopedics;  Laterality: Right;   ANTERIOR CERVICAL CORPECTOMY N/A 11/25/2015   Procedure: ANTERIOR CERVICAL FIVE CORPECTOMY Cervical four - six fusion;  Surgeon: Gerldine Maizes, MD;  Location: [facility];  Service: Neurosurgery;  Laterality: N/A;  ANTERIOR CERVICAL FIVE CORPECTOMY Cervical four - six fusion   APPLICATION OF WOUND VAC Right 10/22/2018   Procedure: Application Of Wound Vac;  Surgeon: Harden Jerona GAILS, MD;  Location: [facility] OR;  Service: Orthopedics;  Laterality: Right;   CYSTOSCOPY WITH BIOPSY N/A 05/01/2022   Procedure: CYSTOSCOPY WITH URETHRAL BIOPSY;  Surgeon: Elisabeth Valli BIRCH, MD;  Location: [facility] ORS;  Service: Urology;  Laterality: N/A;  1 HR   CYSTOSCOPY WITH RETROGRADE URETHROGRAM N/A 04/25/2018   Procedure: CYSTOSCOPY WITH RETROGRADE URETHROGRAM/ BALLOON DILATION;  Surgeon: Ottelin, Mark, MD;  Location: [facility] ORS;  Service: Urology;  Laterality: N/A;   CYSTOSCOPY WITH URETHRAL DILATATION N/A 05/01/2022   Procedure: BALLOON DILATION WITH  OPTILUME;  Surgeon: Elisabeth Valli BIRCH, MD;  Location: [facility] ORS;  Service: Urology;  Laterality: N/A;   LAPAROTOMY N/A 12/03/2022   Procedure: EXPLORATORY LAPAROTOMY  total abdominal colectomy with end ileostomy;  Surgeon: Kinsinger, Herlene Righter, MD;  Location: [facility] OR;  Service: General;  Laterality: N/A;   MULTIPLE TOOTH EXTRACTIONS     POSTERIOR CERVICAL FUSION/FORAMINOTOMY N/A 11/29/2015   Procedure: Cervical Three-Cervical Seven Posterior Cervical Laminectomy with Fusion;  Surgeon: Gerldine Maizes, MD;  Location: [facility];  Service: Neurosurgery;  Laterality: N/A;  Cervical Three-Cervical Seven Posterior Cervical Laminectomy with Fusion   STUMP REVISION Right 10/22/2018   Procedure: REVISION RIGHT BELOW KNEE AMPUTATION;  Surgeon: Harden Jerona GAILS, MD;  Location: [facility] OR;  Service: Orthopedics;  Laterality: Right;   STUMP REVISION Right 12/05/2018   Procedure:  Revision Right Above Knee Amputation;  Surgeon: Harden Jerona GAILS, MD;  Location: [facility] OR;  Service: Orthopedics;  Laterality: Right;   STUMP REVISION Right 05/29/2019   Procedure: REVISION RIGHT ABOVE KNEE AMPUTATION;  Surgeon: Harden Jerona GAILS, MD;  Location: [facility] OR;  Service: Orthopedics;  Laterality: Right;   STUMP REVISION Right 06/24/2019   Procedure: STUMP REVISION;  Surgeon: Harden Jerona GAILS, MD;  Location: [facility] OR;  Service: Orthopedics;  Laterality: Right;     Medications:   Current Facility-Administered Medications:    acetaminophen  (TYLENOL ) tablet 650 mg, 650 mg, Oral, Q6H, Claudene Toribio BROCKS, MD, 650 mg at 07/24/23 0535   arformoterol  (BROVANA ) nebulizer solution 15 mcg, 15 mcg, Nebulization, BID, Agarwala, Ravi, MD, 15 mcg at 07/24/23 0904   aspirin  EC tablet 81 mg, 81 mg, Oral, Daily, Hunsucker, Donnice SAUNDERS, MD, 81 mg at 07/24/23 9073   atorvastatin  (LIPITOR ) tablet 80 mg, 80 mg, Oral, QHS, Autry, Lauren E, [name], 80 mg at 07/23/23 2224   budesonide  (PULMICORT ) nebulizer solution 0.5 mg, 0.5 mg, Nebulization, BID, Payne, John D, [name], 0.5 mg at 07/24/23 9095   carvedilol  (COREG ) tablet 12.5 mg, 12.5 mg, Oral, BID WC, Autry, Lauren E, [name], 12.5 mg at 07/22/23 1822   Chlorhexidine  Gluconate Cloth 2 % PADS 6 each, 6 each, Topical, Daily, Claudene Toribio BROCKS, MD, 6 each at 07/23/23 1512   clonazePAM  (KLONOPIN ) tablet 1 mg, 1 mg, Oral, BID, Hoffman, Paul W, NP, 1 mg at 07/24/23 9071   divalproex  (DEPAKOTE ) DR tablet 500 mg, 500 mg, Oral, Q8H, Gretta Leita SQUIBB, DO, 500 mg at 07/24/23 0536   docusate sodium  (COLACE) capsule 100 mg, 100 mg, Oral, BID PRN, Payne, John D, [name]  escitalopram  (LEXAPRO ) tablet 20 mg, 20 mg, Oral, Daily, Autry, Lauren E, [name], 20 mg at 07/24/23 9073   famotidine  (PEPCID ) tablet 20 mg, 20 mg, Oral, Daily, Payne, John D, [name], 20 mg at 07/24/23 9071   feeding supplement (ENSURE ENLIVE / ENSURE PLUS) liquid 237 mL, 237 mL, Oral, BID BM, Hunsucker, Donnice SAUNDERS, MD, 237 mL at 07/24/23  0934   haloperidol  lactate (HALDOL ) injection 5 mg, 5 mg, Intravenous, Q6H PRN, Hunsucker, Donnice SAUNDERS, MD, 5 mg at 07/23/23 2005   haloperidol  lactate (HALDOL ) injection 5 mg, 5 mg, Intramuscular, Once, Paliwal, Aditya, MD   heparin  injection 5,000 Units, 5,000 Units, Subcutaneous, Q8H, Payne, John D, [name], 5,000 Units at 07/24/23 9463   hydrALAZINE  (APRESOLINE ) injection 10-40 mg, 10-40 mg, Intravenous, Q4H PRN, Payne, John D, [name]   hydrALAZINE  (APRESOLINE ) tablet 10 mg, 10 mg, Oral, Q8H, Autry, Lauren E, [name], 10 mg at 07/23/23 2231   HYDROmorphone  (DILAUDID ) injection 1 mg, 1 mg, Intravenous, Q4H PRN, Claudene Toribio BROCKS, MD, 1 mg at 07/22/23 1421   HYDROmorphone  (DILAUDID ) tablet 2 mg, 2 mg, Oral, Q6H, Claudene Toribio BROCKS, MD, 2 mg at 07/24/23 9463   ipratropium-albuterol  (DUONEB) 0.5-2.5 (3) MG/3ML nebulizer solution 3 mL, 3 mL, Nebulization, Q4H PRN, Payne, John D, [name]   LORazepam  (ATIVAN ) injection 1 mg, 1 mg, Intravenous, Q4H PRN, Paliwal, Aditya, MD, 1 mg at 07/23/23 0113   Oral care mouth rinse, 15 mL, Mouth Rinse, PRN, Geronimo Amel, MD   pantoprazole  (PROTONIX ) EC tablet 40 mg, 40 mg, Oral, Daily, Autry, Lauren E, [name], 40 mg at 07/24/23 9073   polyethylene glycol (MIRALAX  / GLYCOLAX ) packet 17 g, 17 g, Oral, Daily PRN, Payne, John D, [name]   pregabalin  (LYRICA ) capsule 200 mg, 200 mg, Oral, BID, Gretta Leita SQUIBB, DO, 200 mg at 07/24/23 9073   QUEtiapine  (SEROQUEL ) tablet 400 mg, 400 mg, Oral, BID, Claudene Toribio BROCKS, MD, 400 mg at 07/24/23 9073   revefenacin  (YUPELRI ) nebulizer solution 175 mcg, 175 mcg, Nebulization, Daily, Payne, John D, [name], 175 mcg at 07/24/23 9095   sodium bicarbonate  tablet 650 mg, 650 mg, Oral, BID, Hunsucker, Donnice SAUNDERS, MD, 650 mg at 07/24/23 0926   [COMPLETED] thiamine  (VITAMIN B1) 500 mg in sodium chloride  0.9 % 50 mL IVPB, 500 mg, Intravenous, Q8H, Last Rate: 110 mL/hr at 07/21/23 0600, Infusion Verify at 07/21/23 0600 **FOLLOWED BY** thiamine  (VITAMIN B1) tablet 100  mg, 100 mg, Oral, Daily, Claudene Toribio BROCKS, MD, 100 mg at 07/24/23 9071  Allergies: Allergies  Allergen Reactions   Latex Other (See Comments)    Unknown reaction    Landers Prajapati, MD, PGY-2

## 2023-07-24 NOTE — Plan of Care (Signed)
  Problem: Safety: Goal: Non-violent Restraint(s) Outcome: Progressing   Problem: Education: Goal: Knowledge of General Education information will improve Description: Including pain rating scale, medication(s)/side effects and non-pharmacologic comfort measures Outcome: Progressing   Problem: Health Behavior/Discharge Planning: Goal: Ability to manage health-related needs will improve Outcome: Progressing   Problem: Clinical Measurements: Goal: Will remain free from infection Outcome: Progressing Goal: Diagnostic test results will improve Outcome: Progressing Goal: Respiratory complications will improve Outcome: Progressing   Problem: Activity: Goal: Risk for activity intolerance will decrease Outcome: Progressing   Problem: Nutrition: Goal: Adequate nutrition will be maintained Outcome: Progressing   Problem: Coping: Goal: Level of anxiety will decrease Outcome: Progressing   Problem: Elimination: Goal: Will not experience complications related to bowel motility Outcome: Progressing Goal: Will not experience complications related to urinary retention Outcome: Progressing   Problem: Pain Managment: Goal: General experience of comfort will improve and/or be controlled Outcome: Progressing   Problem: Safety: Goal: Ability to remain free from injury will improve Outcome: Progressing   Problem: Skin Integrity: Goal: Risk for impaired skin integrity will decrease Outcome: Progressing

## 2023-07-24 NOTE — Progress Notes (Signed)
 Pt refused labs this morning. MD aware.

## 2023-07-24 NOTE — Progress Notes (Signed)
 Occupational Therapy Treatment Patient Details Name: Gregory [name] MRN: [id number] DOB: [birth year] Today's Date: 07/24/2023   History of present illness 63 yo male from [place] admitted 1/27 with AMS; Acute encephalopathy/agitation delirium. 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   OT comments  Pt sleeping upon arrival. Repositioned with HOB up and applied cool washcloth to face. Pt awakened. Attempted to work on self feeding. Pt unable to stay alert to safely offer food/drink. Will continue efforts. Patient will benefit from continued inpatient follow up therapy, <3 hours/day.      If plan is discharge home, recommend the following:  Two people to help with walking and/or transfers;Two people to help with bathing/dressing/bathroom;Assistance with cooking/housework;Assistance with feeding;Direct supervision/assist for medications management;Direct supervision/assist for financial management;Assist for transportation;Help with stairs or ramp for entrance   Equipment Recommendations  Other (comment) (defer to next venue)    Recommendations for Other Services      Precautions / Restrictions Precautions Precautions: Fall;Other (comment) (contact) Restrictions Weight Bearing Restrictions Per Provider Order: No Other Position/Activity Restrictions: No weight bearing restricitions per orders or prior PT notes       Mobility Bed Mobility Overal bed mobility: Needs Assistance             General bed mobility comments: max assist to pull up in bed prior to raising [facility] in attempt to work on grooming/self feeding    Transfers                         Balance                                            ADL either performed or assessed with clinical judgement   ADL Overall ADL's : Needs assistance/impaired Eating/Feeding: Total assistance;Bed level Eating/Feeding Details (indicate cue type and reason): hand over hand to bring cup with straw to mouth Grooming: Wash/dry face;Bed level;Total assistance                                      Extremity/Trunk Assessment              Vision       Perception     Praxis      Cognition Arousal: Obtunded Behavior During Therapy: Flat affect Overall Cognitive Status: No family/caregiver present to determine baseline cognitive functioning Area of Impairment: Attention, Following commands, Safety/judgement, Problem solving                   Current Attention Level: Focused   Following Commands: Follows one step commands inconsistently Safety/Judgement: Decreased awareness of safety, Decreased awareness of deficits   Problem Solving: Decreased initiation, Difficulty sequencing, Requires verbal cues, Requires tactile cues, Slow processing          Exercises      Shoulder Instructions       General Comments      Pertinent Vitals/ Pain       Pain Assessment Pain Assessment: Faces Faces Pain Scale: Hurts little more Pain Location: generalized Pain Descriptors /  Indicators: Grimacing, Moaning Pain Intervention(s): Repositioned  Home Living                                          Prior Functioning/Environment              Frequency  Min 1X/week        Progress Toward Goals  OT Goals(current goals can now be found in the care plan section)  Progress towards OT goals: Not progressing toward goals - comment  Acute Rehab OT Goals OT Goal Formulation: Patient unable to participate in goal setting Time For Goal Achievement: 07/31/23 Potential to Achieve Goals: Fair  Plan      Co-evaluation                 AM-PAC OT 6 Clicks Daily Activity      Outcome Measure   Help from another person eating meals?: Total Help from another person taking care of personal grooming?: Total Help from another person toileting, which includes using toliet, bedpan, or urinal?: Total Help from another person bathing (including washing, rinsing, drying)?: Total Help from another person to put on and taking off regular upper body clothing?: Total Help from another person to put on and taking off regular lower body clothing?: Total 6 Click Score: 6    End of Session    OT Visit Diagnosis: Muscle weakness (generalized) (M62.81);Other symptoms and signs involving cognitive function   Activity Tolerance Patient limited by lethargy   Patient Left in bed;with call bell/phone within reach;with bed alarm set   Nurse Communication          Time: 8593-8579 OT Time Calculation (min): 14 min  Charges: OT General Charges $OT Visit: 1 Visit OT Treatments $Self Care/Home Management : 8-22 mins  Mliss HERO, OTR/L Acute Rehabilitation Services Office: [phone]   Kennth Mliss Helling 07/24/2023, 2:54 PM

## 2023-07-24 NOTE — Progress Notes (Signed)
 PROGRESS NOTE    Gregory [name]  FMW:[id number] DOB: 11-Dec-1960 DOA: 07/15/2023 PCP: [name], Provider, MD   Brief Narrative:  63 year old male with history of diabetes mellitus type 2, hypertension, hyperlipidemia, chronic kidney disease stage IIIb, COPD, chronic osteomyelitis status post right AKA, left midfoot amputation, unspecified stroke, A-fib, chronic diastolic heart failure, mesenteric ischemia status post colostomy and recent hospitalization from 06/27/2023 to 07/11/2023 for septic shock due to ESBL UTI and multifocal pneumonia with rhinovirus infection/acute respiratory failure with hypoxia requiring intubation and extubation/acute encephalopathy/acute ischemic stroke and subsequent discharge to SNF presented with severe agitation and delirium.  On presentation, CT of the head was negative for acute abnormalities.  Ammonia and VBG were unremarkable.  He was admitted to ICU and managed with Precedex .  He was transferred to TRH service from 07/23/2023 onwards.  Assessment & Plan:   Acute encephalopathy/agitation and delirium -Initially admitted to ICU under PCCM service and treated with Precedex .  Ammonia, VBG and CT of the head were negative for acute intracranial abnormality.  Procalcitonin less than 0.1.  Blood cultures negative so far.  Care transferred to TRH service from 07/23/2023 onwards. -Still having intermittent agitation: On multiple sedative and mood stabilizing agents along with pain medications.  Doses recently adjusted by PCCM team.  Still slow to respond.  Psychiatry evaluation pending.  Follow recommendations. -Monitor mental status.  Fall precautions. -Completed high-dose thiamine .  Continue current dose of thiamine   COPD -Stable.  Continue current nebulized regimen  Chronic systolic heart failure Hypertension Hyperlipidemia -compensated.  Continue strict input and output.  Daily weights.  Fluid restriction.  Continue Coreg , hydralazine  and statin.  Outpatient follow-up  with cardiology  Diabetes mellitus type 2 -Blood sugars controlled  CKD stage IIIb -Creatinine currently stable.  Patient refused labs today.  Hypomagnesemia -Resolved.  No labs today  Chronic pain syndrome -Continue current regimen  Depression/anxiety -Continue current regimen.  Psychiatry consulted.  Follow recommendations  Thrombocytopenia -Questionable cause.  No signs of bleeding.  Monitor platelets intermittently  Anemia of chronic disease -Possibly from chronic kidney disease.  Hemoglobin stable.  Monitor intermittently  Leukopenia -Resolved  Goals of care -Overall prognosis is guarded to poor considering recurrent hospitalizations.  Will reconsult palliative care.  Currently listed as full code.  Generalized deconditioning -PT recommending SNF placement.  Consult TOC  DVT prophylaxis: Subcutaneous heparin  Code Status: Full Family Communication: None at bedside Disposition Plan: Status is: Inpatient Remains inpatient appropriate because: Of severity of illness  Consultants: PCCM/psychiatry.  Consult palliative care  Procedures: None  Antimicrobials:  Anti-infectives (From admission, onward)    Start     Dose/Rate Route Frequency Ordered Stop   07/16/23 2200  vancomycin  (VANCOREADY) IVPB 750 mg/150 mL  Status:  Discontinued        750 mg 150 mL/hr over 60 Minutes Intravenous Every 12 hours 07/16/23 0739 07/16/23 1243   07/16/23 2200  vancomycin  (VANCOCIN ) 750 mg in sodium chloride  0.9 % 250 mL IVPB  Status:  Discontinued       Note to Pharmacy: Indication: Sepsis   750 mg 265 mL/hr over 60 Minutes Intravenous Every 12 hours 07/16/23 1243 07/17/23 0821   07/16/23 0800  vancomycin  (VANCOREADY) IVPB 1750 mg/350 mL        1,750 mg 175 mL/hr over 120 Minutes Intravenous  Once 07/16/23 0705 07/16/23 1137   07/16/23 0800  piperacillin -tazobactam (ZOSYN ) IVPB 3.375 g  Status:  Discontinued        3.375 g 12.5 mL/hr over 240 Minutes Intravenous Every  8 hours  07/16/23 0705 07/17/23 9178        Subjective: Patient seen and examined at bedside.  Wakes up slightly, extremely slow to respond.  Refused labs this morning as per nursing staff.  No seizures, vomiting reported.  Objective: Vitals:   07/24/23 0031 07/24/23 0300 07/24/23 0500 07/24/23 0931  BP: 113/73 119/75  107/84  Pulse:    60  Resp:    17  Temp:    98 F (36.7 C)  TempSrc:    Axillary  SpO2: 98% 97%    Weight:   84.9 kg   Height:        Intake/Output Summary (Last 24 hours) at 07/24/2023 1352 Last data filed at 07/24/2023 0023 Gross per 24 hour  Intake --  Output 1100 ml  Net -1100 ml   Filed Weights   07/22/23 0343 07/23/23 0400 07/24/23 0500  Weight: 82.2 kg 85.6 kg 84.9 kg    Examination:  General exam: Appears calm and comfortable.  Looks chronically ill and deconditioned.  On room air. Respiratory system: Bilateral decreased breath sounds at bases Cardiovascular system: S1 & S2 heard, Rate controlled Gastrointestinal system: Abdomen is nondistended, soft and nontender. Normal bowel sounds heard.  Colostomy present. Extremities: Left forefoot dressing present; right AKA present  Central nervous system: Wakes up slightly, extremely slow to respond, poor historian.  No focal neurological deficits. Moving extremities Skin: No rashes, lesions or ulcers Psychiatry: Flat affect.  Not agitated.    Data Reviewed: I have personally reviewed following labs and imaging studies  CBC: Recent Labs  Lab 07/18/23 0325 07/19/23 1144 07/20/23 0447 07/21/23 0334 07/22/23 0218  WBC 3.6* 4.5 4.7 4.0 6.0  NEUTROABS 1.8  --   --   --   --   HGB 9.5* 8.3* 8.9* 8.2* 8.4*  HCT 29.0* 26.1* 28.0* 25.4* 26.7*  MCV 89.0 90.6 90.6 90.7 91.8  PLT 137* 138* 111* 126* 136*   Basic Metabolic Panel: Recent Labs  Lab 07/18/23 0325 07/19/23 1144 07/20/23 0447 07/21/23 0334 07/22/23 0218 07/23/23 1045  NA 137 136 139 139 135 138  K 3.5 4.1 3.9 3.6 3.7 4.9  CL 110 109 110  112* 108 105  CO2 16* 17* 19* 19* 20* 22  GLUCOSE 124* 161* 124* 111* 100* 110*  BUN 40* 34* 29* 23 18 18   CREATININE 2.04* 1.54* 1.56* 1.58* 1.45* 1.52*  CALCIUM  8.9 8.9 9.1 8.6* 8.7* 9.2  MG 1.8 1.7  --  1.4* 1.5* 2.0   GFR: Estimated Creatinine Clearance: 53.9 mL/min (A) (by C-G formula based on SCr of 1.52 mg/dL (H)). Liver Function Tests: No results for input(s): AST, ALT, ALKPHOS, BILITOT, PROT, ALBUMIN  in the last 168 hours. No results for input(s): LIPASE, AMYLASE in the last 168 hours. Recent Labs  Lab 07/19/23 1144  AMMONIA 28   Coagulation Profile: No results for input(s): INR, PROTIME in the last 168 hours. Cardiac Enzymes: No results for input(s): CKTOTAL, CKMB, CKMBINDEX, TROPONINI in the last 168 hours. BNP (last 3 results) No results for input(s): PROBNP in the last 8760 hours. HbA1C: No results for input(s): HGBA1C in the last 72 hours. CBG: Recent Labs  Lab 07/23/23 1248 07/23/23 1701 07/23/23 2024 07/24/23 0010 07/24/23 0436  GLUCAP 99 100* 147* 126* 118*   Lipid Profile: No results for input(s): CHOL, HDL, LDLCALC, TRIG, CHOLHDL, LDLDIRECT in the last 72 hours. Thyroid Function Tests: No results for input(s): TSH, T4TOTAL, FREET4, T3FREE, THYROIDAB in the last 72 hours. Anemia Panel:  No results for input(s): VITAMINB12, FOLATE, FERRITIN, TIBC, IRON, RETICCTPCT in the last 72 hours. Sepsis Labs: No results for input(s): PROCALCITON, LATICACIDVEN in the last 168 hours.  Recent Results (from the past 240 hours)  Blood culture (routine x 2)     Status: None   Collection Time: 07/15/23  7:06 PM   Specimen: BLOOD  Result Value Ref Range Status   Specimen Description BLOOD LEFT ANTECUBITAL  Final   Special Requests   Final    BOTTLES DRAWN AEROBIC AND ANAEROBIC Blood Culture adequate volume   Culture   Final    NO GROWTH 5 DAYS Performed at [facility], [address], [place], [place] [address]    Report Status 07/20/2023 FINAL  Final  Blood culture (routine x 2)     Status: None   Collection Time: 07/15/23  8:46 PM   Specimen: BLOOD LEFT HAND  Result Value Ref Range Status   Specimen Description BLOOD LEFT HAND  Final   Special Requests   Final    BOTTLES DRAWN AEROBIC ONLY Blood Culture results may not be optimal due to an inadequate volume of blood received in culture bottles   Culture   Final    NO GROWTH 5 DAYS Performed at [facility], [address], [place], [place] [address]    Report Status 07/20/2023 FINAL  Final  Respiratory (~20 pathogens) panel by PCR     Status: None   Collection Time: 07/16/23  5:16 AM   Specimen: Nasopharyngeal Swab; Respiratory  Result Value Ref Range Status   Adenovirus NOT DETECTED NOT DETECTED Final   Coronavirus 229E NOT DETECTED NOT DETECTED Final    Comment: (NOTE) The Coronavirus on the Respiratory Panel, DOES NOT test for the novel  Coronavirus (2019 nCoV)    Coronavirus HKU1 NOT DETECTED NOT DETECTED Final   Coronavirus NL63 NOT DETECTED NOT DETECTED Final   Coronavirus OC43 NOT DETECTED NOT DETECTED Final   Metapneumovirus NOT DETECTED NOT DETECTED Final   Rhinovirus / Enterovirus NOT DETECTED NOT DETECTED Final   Influenza A NOT DETECTED NOT DETECTED Final   Influenza B NOT DETECTED NOT DETECTED Final   Parainfluenza Virus 1 NOT DETECTED NOT DETECTED Final   Parainfluenza Virus 2 NOT DETECTED NOT DETECTED Final   Parainfluenza Virus 3 NOT DETECTED NOT DETECTED Final   Parainfluenza Virus 4 NOT DETECTED NOT DETECTED Final   Respiratory Syncytial Virus NOT DETECTED NOT DETECTED Final   Bordetella pertussis NOT DETECTED NOT DETECTED Final   Bordetella Parapertussis NOT DETECTED NOT DETECTED Final   Chlamydophila pneumoniae NOT DETECTED NOT DETECTED Final   Mycoplasma pneumoniae NOT DETECTED NOT DETECTED Final    Comment: Performed at [facility], [address], [place], [place]  [address]  MRSA Next Gen by PCR, Nasal     Status: Abnormal   Collection Time: 07/16/23  6:53 AM   Specimen: Nasal Mucosa; Nasal Swab  Result Value Ref Range Status   MRSA by PCR Next Gen DETECTED (A) NOT DETECTED Final    Comment: RESULT CALLED TO, READ BACK BY AND VERIFIED WITH: RN b. butner [id number] FCP (NOTE) The GeneXpert MRSA Assay (FDA approved for NASAL specimens only), is one component of a comprehensive MRSA colonization surveillance program. It is not intended to diagnose MRSA infection nor to guide or monitor treatment for MRSA infections. Test performance is not FDA approved in patients less than 6 years old. Performed at [facility], [address] [place], [place] [address]          Radiology Studies: No results found.      Scheduled Meds:  acetaminophen   650 mg Oral Q6H   arformoterol   15 mcg Nebulization BID   aspirin  EC  81 mg Oral Daily   atorvastatin   80 mg Oral QHS   budesonide  (PULMICORT ) nebulizer solution  0.5 mg Nebulization BID   carvedilol   12.5 mg Oral BID WC   Chlorhexidine  Gluconate Cloth  6 each Topical Daily   divalproex   500 mg Oral Q8H   escitalopram   20 mg Oral Daily   famotidine   20 mg Oral Daily   feeding supplement  237 mL Oral BID BM   haloperidol  lactate  5 mg Intramuscular Once   heparin   5,000 Units Subcutaneous Q8H   hydrALAZINE   10 mg Oral Q8H   HYDROmorphone   2 mg Oral Q6H   pantoprazole   40 mg Oral Daily   pregabalin   200 mg Oral BID   QUEtiapine   400 mg Oral QHS   revefenacin   175 mcg Nebulization Daily   sodium bicarbonate   650 mg Oral BID   thiamine   100 mg Oral Daily   Continuous Infusions:        Sophie Mao, MD [facility] 07/24/2023, 1:52 PM

## 2023-07-24 NOTE — Progress Notes (Signed)
 Patient restraints order was discontinued this shift.

## 2023-07-25 DIAGNOSIS — Z515 Encounter for palliative care: Secondary | ICD-10-CM

## 2023-07-25 DIAGNOSIS — G9340 Encephalopathy, unspecified: Secondary | ICD-10-CM | POA: Diagnosis not present

## 2023-07-25 DIAGNOSIS — Z7189 Other specified counseling: Secondary | ICD-10-CM | POA: Diagnosis not present

## 2023-07-25 LAB — CBC WITH DIFFERENTIAL/PLATELET
Abs Immature Granulocytes: 0.02 10*3/uL (ref 0.00–0.07)
Basophils Absolute: 0 10*3/uL (ref 0.0–0.1)
Basophils Relative: 1 %
Eosinophils Absolute: 0.3 10*3/uL (ref 0.0–0.5)
Eosinophils Relative: 7 %
HCT: 29.8 % — ABNORMAL LOW (ref 39.0–52.0)
Hemoglobin: 9.6 g/dL — ABNORMAL LOW (ref 13.0–17.0)
Immature Granulocytes: 0 %
Lymphocytes Relative: 44 %
Lymphs Abs: 2.2 10*3/uL (ref 0.7–4.0)
MCH: 29.5 pg (ref 26.0–34.0)
MCHC: 32.2 g/dL (ref 30.0–36.0)
MCV: 91.7 fL (ref 80.0–100.0)
Monocytes Absolute: 0.3 10*3/uL (ref 0.1–1.0)
Monocytes Relative: 7 %
Neutro Abs: 2 10*3/uL (ref 1.7–7.7)
Neutrophils Relative %: 41 %
Platelets: 175 10*3/uL (ref 150–400)
RBC: 3.25 MIL/uL — ABNORMAL LOW (ref 4.22–5.81)
RDW: 17.7 % — ABNORMAL HIGH (ref 11.5–15.5)
WBC: 4.9 10*3/uL (ref 4.0–10.5)
nRBC: 0 % (ref 0.0–0.2)

## 2023-07-25 LAB — MAGNESIUM: Magnesium: 1.8 mg/dL (ref 1.7–2.4)

## 2023-07-25 LAB — HEPATIC FUNCTION PANEL
ALT: 18 U/L (ref 0–44)
AST: 16 U/L (ref 15–41)
Albumin: 2.7 g/dL — ABNORMAL LOW (ref 3.5–5.0)
Alkaline Phosphatase: 80 U/L (ref 38–126)
Bilirubin, Direct: 0.1 mg/dL (ref 0.0–0.2)
Indirect Bilirubin: 0.6 mg/dL (ref 0.3–0.9)
Total Bilirubin: 0.7 mg/dL (ref 0.0–1.2)
Total Protein: 6.3 g/dL — ABNORMAL LOW (ref 6.5–8.1)

## 2023-07-25 LAB — GLUCOSE, CAPILLARY
Glucose-Capillary: 126 mg/dL — ABNORMAL HIGH (ref 70–99)
Glucose-Capillary: 118 mg/dL — ABNORMAL HIGH (ref 70–99)
Glucose-Capillary: 105 mg/dL — ABNORMAL HIGH (ref 70–99)
Glucose-Capillary: 100 mg/dL — ABNORMAL HIGH (ref 70–99)
Glucose-Capillary: 109 mg/dL — ABNORMAL HIGH (ref 70–99)

## 2023-07-25 NOTE — Consult Note (Signed)
 Palliative Care Consult Note                                  Date: 07/25/2023   Patient Name: Gregory [name]  DOB: 11/01/1960  MRN: [id number]  Age / Sex: 63 y.o., male  PCP: [name], Provider, MD Referring Physician: Arlon Carliss ORN, DO  Reason for Consultation: Establishing goals of care  HPI/Patient Profile: 63 y.o. 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 He was admitted to ICU and managed with Precedex .   He was hospitalized 06/27/2023 to 07/11/2023 for septic shock due to ESBL UTI and multifocal pneumonia with rhinovirus infection/acute respiratory failure with hypoxia requiring intubation and extubation/acute encephalopathy/acute ischemic stroke. Palliative care saw during the January 2025 hospitalization.   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    Chronic pain syndrome    COPD (chronic obstructive pulmonary disease) (HCC)    DDD (degenerative disc disease), lumbar    Depression    major depressive disorder    Diabetes mellitus without complication (HCC)    type 2    Diabetic ulcer of toe of left foot (HCC) 10/02/2016   Difficult intubation    Difficulty in walking, not elsewhere  classified    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   Foot drop, bilateral    Foot drop, right 12/27/2015   GERD (gastroesophageal reflux disease)    Herpesviral vesicular dermatitis    History of COVID-19 03/15/2022   Hyperlipidemia    Hyperlipidemia    Hypertension    Insomnia    Malignant melanoma of other parts of face (HCC)    Melanoma (HCC)    MRSA bacteremia    Muscle weakness (generalized)    Nasal congestion    Necrosis of toe (HCC) 07/08/2018   Neuromuscular dysfunction of bladder    Osteomyelitis (HCC)    Osteomyelitis (HCC)    Osteomyelitis (HCC)    right BKA   Other idiopathic peripheral autonomic neuropathy    Retention of urine, unspecified    Squamous cell carcinoma of skin    Stroke (HCC)    Unsteadiness on feet    Urinary retention    Urinary retention    Urinary tract infection    Wears dentures    Wears glasses     Subjective:   I have reviewed medical records including EPIC notes, labs and imaging, received update from nursing, assessed the patient and then spoke by phone with patient's daughter Gregory [name] to discuss diagnosis prognosis, GOC, EOL wishes, disposition and options.  I introduced Palliative Medicine as specialized medical care for people living  with serious illness. It focuses on providing relief from symptoms and stress of a serious illness. The goal is to improve quality of life for both the patient and the family.  Today's Discussion: Patient was in bed in NAD. He was oriented to person and place but not time or situation. He had received an as needed dose of Haldol  for agitation. He was confused and unable to participate in discussions regarding goals of care.  Spoke with patient's daughter Gregory who is familiar with PMT services from the patient's last admission. She reports the patient's confusion worsened since discharge back to facility in January. She shares the patient lacked situational awareness and was acting very unlike  himself.  Gregory is a engineer, civil (consulting) and has a good understanding of the patient's chronic and acute conditions. She was hopeful during the last admission that the patient would become less confused and would be able to participate in goals of care conversations, but this has not happened. In the past the patient has said he wants everything done to sustain his life. [name] wants to honor the patient's wishes, so he will remain full code and full scope. She is concerned that the confusion may not clear. She shares that if the confusion continues she may have to make decisions regarding his code status and scope of care-- but right now she needs time for outcomes/ improvement.  Gregory is hopeful the patient will be able to return to his LTC at [facility]. She requests call from SW.  Discussed the importance of continued conversation with family and the medical providers regarding overall plan of care and treatment options, ensuring decisions are within the context of the patient's values and GOCs.  Questions and concerns were addressed. The family was encouraged to call with questions or concerns. PMT will continue to support holistically.  Review of Systems  Unable to perform ROS   Objective:   Primary Diagnoses: Present on Admission:  Acute encephalopathy   Physical Exam Vitals reviewed.  Constitutional:      Appearance: He is ill-appearing.  HENT:     Head: Normocephalic and atraumatic.  Cardiovascular:     Rate and Rhythm: Normal rate.  Pulmonary:     Effort: Pulmonary effort is normal.  Musculoskeletal:     Comments: Right AKA  Skin:    General: Skin is warm and dry.  Neurological:     Mental Status: He is alert. He is disoriented and confused.  Psychiatric:        Behavior: Behavior is agitated.     Vital Signs:  BP 114/67 (BP Location: Left Arm)   Pulse 65   Temp 98.5 F (36.9 C) (Oral)   Resp 20   Ht 6' (1.829 m)   Wt 83.1 kg   SpO2 98%   BMI 24.85 kg/m      Advanced Care Planning:   Existing Vynca/ACP Documentation: MOST form  Primary Decision Maker: NEXT OF KIN. Patient's daughter Gregory [name] is decision maker while patient is unable to make decisions.  Code Status/Advance Care Planning: Full code   Assessment & Plan:   SUMMARY OF RECOMMENDATIONS   Full code Full scope Time for outcomes: Daughter is hopeful the patient will be able to participate in GOC discussions if/when confusion improves Messaged SW re: Patient's daughter has questions about return to [facility] PMT will follow   Discussed with: bedside RN and Dr. Arlon  Time Total: 75 minutes  Thank you for allowing us  to participate in the  care of STRATTON VILLWOCK PMT will continue to support holistically.   Signed by: Stephane Palin, NP Palliative Medicine Team  Team Phone # [phone] (Nights/Weekends)  07/25/2023, 11:21 AM

## 2023-07-25 NOTE — Plan of Care (Signed)
  Problem: Safety: Goal: Non-violent Restraint(s) Outcome: Progressing   Problem: Nutrition: Goal: Adequate nutrition will be maintained Outcome: Progressing   Problem: Elimination: Goal: Will not experience complications related to bowel motility Outcome: Progressing   Problem: Pain Managment: Goal: General experience of comfort will improve and/or be controlled Outcome: Progressing

## 2023-07-25 NOTE — TOC Progression Note (Signed)
 Transition of Care [facility]) - Progression Note    Patient Details  Name: Gregory [name] MRN: [id number] Date of Birth: 12/10/1960  Transition of Care [facility]) CM/SW Contact  Jayin Derousse A Amarisa Wilinski, LCSW Phone Number: 07/25/2023, 4:17 PM  Clinical Narrative:     CSW reached out to pt's daughter, Gregory [name], and left voicemail with contact information to reach back out to CSW.    TOC will continue to follow.        Expected Discharge Plan and Services                                               Social Determinants of Health (SDOH) Interventions SDOH Screenings   Food Insecurity: No Food Insecurity (07/20/2023)  Housing: Low Risk  (07/20/2023)  Transportation Needs: No Transportation Needs (07/20/2023)  Utilities: Not At Risk (07/20/2023)  Depression (PHQ2-9): Low Risk  (03/08/2021)  Tobacco Use: High Risk (06/30/2023)    Readmission Risk Interventions    09/06/2022    1:48 PM 08/27/2022   12:46 PM 05/03/2022   11:53 AM  Readmission Risk Prevention Plan  Transportation Screening Complete Complete Complete  Medication Review Oceanographer) Complete Complete Complete  PCP or Specialist appointment within 3-5 days of discharge Complete Complete Complete  HRI or Home Care Consult Complete Complete Complete  SW Recovery Care/Counseling Consult Complete Complete Complete  Palliative Care Screening Not Applicable Not Applicable Not Applicable  Skilled Nursing Facility Complete Complete Complete

## 2023-07-25 NOTE — Progress Notes (Signed)
 Progress Note   Patient: Gregory [name] FMW:[id number] DOB: [birth year] DOA: 07/15/2023     9 DOS: the patient was seen and examined on 07/25/2023   Brief hospital course: 63 year old male with history of diabetes mellitus type 2, hypertension, hyperlipidemia, chronic kidney disease stage IIIb, COPD, chronic osteomyelitis status post right AKA, left midfoot amputation, unspecified stroke, A-fib, chronic diastolic heart failure, mesenteric ischemia status post colostomy and recent hospitalization from 06/27/2023 to 07/11/2023 for septic shock due to ESBL UTI and multifocal pneumonia with rhinovirus infection/acute respiratory failure with hypoxia requiring intubation and extubation/acute encephalopathy/acute ischemic stroke and subsequent discharge to SNF presented with severe agitation and delirium.  On presentation, CT of the head was negative for acute abnormalities.  Ammonia and VBG were unremarkable.  He was admitted to ICU and managed with Precedex .  He was transferred to TRH service from 07/23/2023 onwards.   Assessment and Plan:  Acute metabolic encephalopathy/agitation and delirium -Initially admitted to ICU under PCCM service and treated with Precedex .  Ammonia, VBG and CT of the head were negative for acute intracranial abnormality.  Procalcitonin less than 0.1.  Blood cultures negative so far.  Care transferred to TRH service from 07/23/2023 onwards. -Showing improvement today.  Psychiatry following closely.  Working to reduce sedative type medications which may be contributing to patient's delirium.  COPD -Stable.  Continue current nebulized regimen   Chronic systolic heart failure Hypertension Hyperlipidemia -Not appear to be in acute exacerbation.  Continue strict input and output.  Daily weights.  Fluid restriction.  Continue Coreg , hydralazine  and statin.  Outpatient follow-up with cardiology   Diabetes mellitus type 2 -Blood sugars controlled   CKD stage IIIb -Creatinine currently  stable.  Will recheck BMP in a.m.   Hypomagnesemia -Resolved.  Will recheck magnesium  in a.m.   Chronic pain syndrome -Continue current regimen   Depression/anxiety -Continue current regimen.  Psychiatry consulted.  Follow recommendations   Thrombocytopenia -Questionable cause.  No signs of bleeding.  Monitor platelets intermittently   Anemia of chronic disease -Possibly from chronic kidney disease.  Hemoglobin stable.  Monitor intermittently   Leukopenia -Resolved   Goals of care -Patient evaluated by palliative care.  Patient continues to be full code.  He seems more alert and cooperative this morning.  Closely with case management and family on disposition planning.   Generalized deconditioning -PT recommending SNF placement.  Consult TOC      Subjective: Patient appears more alert and oriented today.  ANO x 3.  Complains of pain in his lower extremities which appears to be chronic phantom type pain.  Denies any fever, shortness of breath, chest pain, nausea, vomiting, abdominal pain.  Physical Exam: Vitals:   07/25/23 0416 07/25/23 0545 07/25/23 0900 07/25/23 1229  BP: (!) 97/58 108/71 114/67 (!) 160/70  Pulse: 64 64 65 (!) 51  Resp: 16 20    Temp: 98.4 F (36.9 C) 98.2 F (36.8 C) 98.5 F (36.9 C) 98.2 F (36.8 C)  TempSrc:   Oral Oral  SpO2: 100% 97% 98% 98%  Weight:  83.1 kg    Height:       GENERAL:  Alert, pleasant, disheveled HEENT:  EOMI CARDIOVASCULAR: Giller and bradycardic, no murmurs appreciated RESPIRATORY:  Clear to auscultation, no wheezing, rales, or rhonchi GASTROINTESTINAL:  Soft, nontender, nondistended EXTREMITIES: Right AKA NEURO:  No new focal deficits appreciated SKIN:  No rashes noted PSYCH:  Appropriate mood and affect   Data Reviewed:  There are no new results to review at this  time.  Family Communication: None at bedside  Disposition: Status is: Inpatient Remains inpatient appropriate because: Continued IV medications and  medication adjustment  Planned Discharge Destination: Skilled nursing facility    Time spent: 32 minutes  Author: Carliss LELON Canales, DO 07/25/2023 12:33 PM  For on call review [email or web address].

## 2023-07-25 NOTE — Plan of Care (Signed)

## 2023-07-26 DIAGNOSIS — G9340 Encephalopathy, unspecified: Secondary | ICD-10-CM | POA: Diagnosis not present

## 2023-07-26 DIAGNOSIS — E440 Moderate protein-calorie malnutrition: Secondary | ICD-10-CM | POA: Insufficient documentation

## 2023-07-26 LAB — BASIC METABOLIC PANEL
Anion gap: 10 (ref 5–15)
BUN: 17 mg/dL (ref 8–23)
CO2: 20 mmol/L — ABNORMAL LOW (ref 22–32)
Calcium: 8.4 mg/dL — ABNORMAL LOW (ref 8.9–10.3)
Chloride: 103 mmol/L (ref 98–111)
Creatinine, Ser: 1.94 mg/dL — ABNORMAL HIGH (ref 0.61–1.24)
GFR, Estimated: 38 mL/min — ABNORMAL LOW (ref >60)
Glucose, Bld: 125 mg/dL — ABNORMAL HIGH (ref 70–99)
Potassium: 4.9 mmol/L (ref 3.5–5.1)
Sodium: 133 mmol/L — ABNORMAL LOW (ref 135–145)

## 2023-07-26 LAB — URINALYSIS, ROUTINE W REFLEX MICROSCOPIC
Bilirubin Urine: NEGATIVE
Glucose, UA: NEGATIVE mg/dL
Ketones, ur: NEGATIVE mg/dL
Nitrite: POSITIVE — AB
Protein, ur: 30 mg/dL — AB
Specific Gravity, Urine: 1.011 (ref 1.005–1.030)
WBC, UA: >50 WBC/hpf (ref 0–5)
pH: 5 (ref 5.0–8.0)

## 2023-07-26 LAB — CBC
HCT: 29.9 % — ABNORMAL LOW (ref 39.0–52.0)
Hemoglobin: 9.8 g/dL — ABNORMAL LOW (ref 13.0–17.0)
MCH: 29.8 pg (ref 26.0–34.0)
MCHC: 32.8 g/dL (ref 30.0–36.0)
MCV: 90.9 fL (ref 80.0–100.0)
Platelets: 155 10*3/uL (ref 150–400)
RBC: 3.29 MIL/uL — ABNORMAL LOW (ref 4.22–5.81)
RDW: 17.2 % — ABNORMAL HIGH (ref 11.5–15.5)
WBC: 14.9 10*3/uL — ABNORMAL HIGH (ref 4.0–10.5)
nRBC: 0 % (ref 0.0–0.2)

## 2023-07-26 LAB — GLUCOSE, CAPILLARY: Glucose-Capillary: 85 mg/dL (ref 70–99)

## 2023-07-26 LAB — MAGNESIUM: Magnesium: 1.3 mg/dL — ABNORMAL LOW (ref 1.7–2.4)

## 2023-07-26 MED ORDER — SULFAMETHOXAZOLE-TRIMETHOPRIM 800-160 MG PO TABS: 1.0000 | ORAL_TABLET | Freq: Two times a day (BID) | ORAL | Status: DC

## 2023-07-26 MED ORDER — ADULT MULTIVITAMIN W/MINERALS CH
1.0000 | ORAL_TABLET | Freq: Every day | ORAL | Status: DC
  Administered 2023-07-26 – 2023-08-18 (×21): 1 via ORAL
  Filled 2023-07-26 (×25): qty 1

## 2023-07-26 MED ORDER — SODIUM CHLORIDE 0.9 % IV BOLUS
1000.0000 mL | Freq: Once | INTRAVENOUS | Status: AC
  Administered 2023-07-26: 1000 mL via INTRAVENOUS

## 2023-07-26 MED ORDER — SODIUM CHLORIDE 0.9 % IV SOLN
1.0000 g | INTRAVENOUS | Status: AC
  Administered 2023-07-26 – 2023-07-30 (×4): 1 g via INTRAVENOUS
  Filled 2023-07-26 (×5): qty 10

## 2023-07-26 MED ORDER — MAGNESIUM SULFATE 2 GM/50ML IV SOLN
2.0000 g | Freq: Once | INTRAVENOUS | Status: AC
  Administered 2023-07-26: 2 g via INTRAVENOUS
  Filled 2023-07-26: qty 50

## 2023-07-26 NOTE — Progress Notes (Addendum)
 Progress Note   Patient: Gregory [name] FMW:[id number] DOB: 09/05/1960 DOA: 07/15/2023     10 DOS: the patient was seen and examined on 07/26/2023   Brief hospital course: 63 year old male with history of diabetes mellitus type 2, hypertension, hyperlipidemia, chronic kidney disease stage IIIb, COPD, chronic osteomyelitis status post right AKA, left midfoot amputation, unspecified stroke, A-fib, chronic diastolic heart failure, mesenteric ischemia status post colostomy and recent hospitalization from 06/27/2023 to 07/11/2023 for septic shock due to ESBL UTI and multifocal pneumonia with rhinovirus infection/acute respiratory failure with hypoxia requiring intubation and extubation/acute encephalopathy/acute ischemic stroke and subsequent discharge to SNF presented with severe agitation and delirium.  On presentation, CT of the head was negative for acute abnormalities.  Ammonia and VBG were unremarkable.  He was admitted to ICU and managed with Precedex .  He was transferred to TRH service from 07/23/2023 onwards.   Assessment and Plan:  Acute metabolic encephalopathy/agitation and delirium -Initially admitted to ICU under PCCM service and treated with Precedex .  Ammonia, VBG and CT of the head were negative for acute intracranial abnormality.  Procalcitonin less than 0.1.  Blood cultures negative so far.  Care transferred to TRH service from 07/23/2023 onwards. -Psychiatry following closely.  Working to reduce sedative type medications which may be contributing to patient's delirium. -Initially show improvement however this morning appears more lethargic.  Underlying etiology include dehydration, medication effect, urinary retention, or even insidious infection.  Will order UA, IV fluid bolus 1 L NS.  Will monitor her encephalopathy and blood pressure closely.  Hypotension - This morning, systolic blood pressures in the 80s.  Patient appeared more lethargic.  Unclear if from hypovolemia or other etiology such  as acute urinary retention.  IV fluid bolus given with excellent response, systolic blood pressures in the 130s now.  UA pending.  Monitor urine output.  Possible Urinary Tract Infection -UA appears cloudy, large amount of leukocytes and nitrate positive with noted bacteria.  Will place on empiric ceftriaxone .  Could just be bacteriuria and colonization from dehydration.  Will treat empirically given hypotension and lethargy.  Patient afebrile.  COPD -Stable.  Continue current nebulized regimen   Chronic systolic heart failure Hypertension Hyperlipidemia -Not appear to be in acute exacerbation.  Continue strict input and output.  Daily weights.  Fluid restriction.  Continue Coreg , hydralazine  and statin.  Outpatient follow-up with cardiology   Diabetes mellitus type 2 -Blood sugars controlled   Acute kidney injury CKD stage IIIb -Slightly worsened since yesterday.  Likely prerenal etiology.  Ordered NS as above.  Monitor urine output.  Will recheck BMP in a.m.   Hypomagnesemia -Recheck this morning showed low magnesium , 1.3.  Will order 2 g IV replenishment.  Will recheck magnesium  in a.m.   Chronic pain syndrome -Continue current regimen   Depression/anxiety -Continue current regimen.  Psychiatry consulted.  Follow recommendations   Anemia of chronic disease -Possibly from chronic kidney disease.  Hemoglobin stable.  Monitor intermittently   Goals of care -Patient evaluated by palliative care.  Patient continues to be full code.  Closely with case management and family on disposition planning.   Generalized deconditioning -PT recommending SNF placement.  Consult TOC      Subjective: Patient much more lethargic this morning than yesterday.  Reportedly received Ativan  early last night.  Blood pressure soft this morning as well, systolics in the 80s-90s.  Afebrile overnight.  Currently denies any pain, shortness of breath, fever, nausea, vomiting, abdominal pain.  Physical  Exam: Vitals:  07/26/23 0836 07/26/23 0846 07/26/23 1033 07/26/23 1209  BP: (!) 86/50  (!) 91/53 133/66  Pulse: 75  70 73  Resp: 18  18 18   Temp: 98.5 F (36.9 C)   98.2 F (36.8 C)  TempSrc: Oral   Oral  SpO2: 98% 99% 98% 98%  Weight:      Height:       GENERAL:  Alert, lethargic, disheveled HEENT:  EOMI CARDIOVASCULAR: Regular and bradycardic, no murmurs appreciated RESPIRATORY:  Clear to auscultation, no wheezing, rales, or rhonchi GASTROINTESTINAL:  Soft, nontender, nondistended EXTREMITIES: Right AKA NEURO:  No new focal deficits appreciated, lethargic but rousable SKIN:  No rashes noted PSYCH:  Appropriate mood and affect   Data Reviewed:  There are no new results to review at this time.  Family Communication: None at bedside  Disposition: Status is: Inpatient Remains inpatient appropriate because: Continued IV medications and medication adjustment  Planned Discharge Destination: Skilled nursing facility    Time spent: 32 minutes  Author: Carliss LELON Canales, DO 07/26/2023 12:18 PM  For on call review [email or web address].

## 2023-07-26 NOTE — Plan of Care (Signed)

## 2023-07-26 NOTE — Progress Notes (Signed)
 Initial Nutrition Assessment  DOCUMENTATION CODES:   Non-severe (moderate) malnutrition in context of chronic illness  INTERVENTION:  Continue to encourage good oral intake. Continue with Ensure Plus High Protein po BID, each supplement provides 350 kcal and 20 grams of protein. Multivitamin/minerals   NUTRITION DIAGNOSIS:   Moderate Malnutrition related to chronic illness as evidenced by moderate fat depletion, moderate muscle depletion.    GOAL:   Patient will meet greater than or equal to 90% of their needs    MONITOR:      REASON FOR ASSESSMENT:   LOS    ASSESSMENT:   63 y.o. M, Presented to ED on 07/15/2023 from SNF with agitation delirium. Admitting with acute encephalopathy. Patient recently admitted on 1/9-1/23 with septic shock and AMS secondary to ESBL UTI and Serratia/Klebsiella multifocal pneumonia/positive rhinovirus.  Had acute respiratory failure requiring intubation.  Patient is MRI also showing acute on chronic right PCA infarct. He was discharged to [facility]. PMH;diabetes mellitus type 2, hypertension, hyperlipidemia, chronic kidney disease stage IIIb, COPD, chronic osteomyelitis status post right AKA, left midfoot amputation, unspecified stroke, A-fib, chronic diastolic heart failure, mesenteric ischemia status post colostomy. Review of weights revealed 19% weight loss since August 2024. Patient alert at time of visit. Moving in bed and restless through out assessment. Poor historian with meal intake. Reported that meal intake depends on mood of patient.Psychiatry has been following closely.   1/28-ICU 1/28- Medical ICU 2/3 Medical Unit   Admit weight: 80.6 kg Hospital weight history; 07/26/23 0500 84 kg 185.19 lbs  07/25/23 05:45:35 83.1 kg 183.2 lbs  07/24/23 0500 84.9 kg 187.17 lbs  07/23/23 04:00:05 85.6 kg 188.71 lbs  07/22/23 0343 82.2 kg 181.22 lbs  07/21/23 0500 82.2 kg 181.22 lbs  07/20/23 0500 83.4 kg 183.86 lbs  07/16/23 0930 81.7  kg 180.12 lbs  07/16/23 0637 80.6 kg 177.69 lbs   Weight history:  07/26/23 84 kg  07/11/23 86.4 kg  02/08/23 104.3 kg  01/07/23 104.3 kg  12/25/22 96.6 kg  11/27/22 112.1 kg  09/18/22 112.1 kg  09/06/22 112.1 kg  08/27/22 86 kg       Average Meal Intake: 50-75: 42% intake x 3 recorded meals  Nutritionally Relevant Medications: Scheduled Meds:  carvedilol   12.5 mg Oral BID WC   divalproex   500 mg Oral Q8H   feeding supplement  237 mL Oral BID BM   hydrALAZINE   10 mg Oral Q8H   QUEtiapine   400 mg Oral QHS   thiamine   100 mg Oral Daily    Labs Reviewed     NUTRITION - FOCUSED PHYSICAL EXAM:  Flowsheet Row Most Recent Value  Orbital Region Mild depletion  Upper Arm Region Moderate depletion  Thoracic and Lumbar Region Moderate depletion  Buccal Region Mild depletion  Temple Region Moderate depletion  Clavicle Bone Region Mild depletion  Clavicle and Acromion Bone Region Mild depletion  Scapular Bone Region Unable to assess  Dorsal Hand Unable to assess  Patellar Region Mild depletion  Anterior Thigh Region Mild depletion  Posterior Calf Region Mild depletion  Edema (RD Assessment) Mild  Hair Reviewed  Eyes Reviewed  Mouth Reviewed  Skin Reviewed  Nails Reviewed       Diet Order:   Diet Order             Diet regular Room service appropriate? Yes; Fluid consistency: Thin  Diet effective now  EDUCATION NEEDS:   Not appropriate for education at this time  Skin:  Skin Assessment: Skin Integrity Issues: Skin Integrity Issues:: Stage I Stage I: Sacrum  Last BM:  2/7 (type 7)  Height:   Ht Readings from Last 1 Encounters:  07/16/23 6' (1.829 m)    Weight:   Wt Readings from Last 1 Encounters:  07/26/23 84 kg    Ideal Body Weight:     BMI:  Body mass index is 25.12 kg/m.  Estimated Nutritional Needs:   Kcal:  2100-2400 kcal  Protein:  110-130 g  Fluid:  33ml/kcal    Jenna Pew RDN, LDN Clinical  Dietitian   If unable to reach, please contact RD Inpatient secure chat group between 8 am-4 pm daily

## 2023-07-26 NOTE — Progress Notes (Signed)
 Colostomy pouch replaced due to the previous one leaking.

## 2023-07-26 NOTE — Consult Note (Addendum)
 [facility] Psychiatric Consult Follow-up  Patient Name: .Gregory [name]  MRN: [id number]  DOB: 11/06/1960  Consult Order details:  Orders (From admission, onward)     Start     Ordered   07/23/23 1704  IP CONSULT TO PSYCHIATRY       Ordering Provider: Rosario Leatrice FERNS, MD  Provider:  (Not yet assigned)  Question Answer Comment  Location [facility]   Reason for Consult? Severe agitation      07/23/23 1703            Mode of Visit: In person   Psychiatry Consult Evaluation  Service Date: July 26, 2023 LOS:  LOS: 10 days  Chief Complaint I'm in pain everywhere  Primary Psychiatric Diagnoses  Hyperactive delirium  2.  Somatic symptom disorder with predominant pain 3. History of MDD   Assessment  Gregory [name] is a 63 y.o. male admitted: Medicallyfor 07/15/2023  3:05 PM for acute agitated encephalopathy. 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   His current presentation of inattention, waxing and waning mental status, change over the last couple of days is most consistent with hyperactive delirium likely in the setting of multiple medications and uncontrolled pain. There is suspicion for somatic symptom disorder with predominant pain given chart review notable for multiple outpatient visits to chronic pain physicians and continued use of oxy throughout the years though notably nothing on PDMP. Main psychiatric diagnosis over the years has been depression/anxiety and patient and collateral denies any past psychiatric hospitalizations or suicide attempts. He reports depression but is unable to state what medications he has been on and whether or not they have helped. Current outpatient psychotropic medications from last admission included scheduled  lexapro  20, oxy 15 TID, lyrica  25 TID, 100 BID, seroquel  100 at bedtime, PRN haldol  5 Q6H, oxy 5 Q6H and historically he was fully alert and mostly oriented on these medications. He was likely compliant with medications prior to admission since he was at the nursing home. On initial examination, patient is alert, irritable, is not able to participate in sustained attention testing and is reporting pain. Will recommend decreasing seroquel , stopping klonopin , continuing depakote , making sure his pain is well controlled. I suspect that he has a low frustration tolerance with regards to his pain which contributes to agitation. On follow-up examination, patient was somnolent and only briefly aroused to voice. Per nursing, patient received ativan  and dilaudid  on and also noted to have soft BP and leukocytosis. Wonder if medications or infection could be contributing to delirium as well. Recommend further work-up for medical causes of AMS. Also recommend obtaining ammonia given depakote  as new medication. Will continue current medication regimen. Please see plan below for detailed recommendations.   Diagnoses:  Active Hospital problems: Principal Problem:   Acute encephalopathy    Plan   ## Psychiatric Medication Recommendations:  --continue seroquel  to 400 QHS for mood stabilization and insomnia given continued somnolence and concern for polypharmacy, consider decrease if continued somnolence without agitation --continue lexapro  20 daily for depression  --stop klonopin  1 BID for agitation  --continue depakote  500 Q8H for mood stabilization, can obtain depakote  level in a couple days if patient continues on medication and is still in the hospital --optimize pain regimen   ## Medical  Decision Making Capacity: Not specifically addressed in this encounter  ## Further Work-up:  --B12, folate  --f/u ammonia level -- most recent EKG on 2/3 had QtC of 413 -- Pertinent labwork reviewed earlier this admission  includes: CMP (Cr 1.52), CBC, TSH, UDS  ## Disposition:-- There are no psychiatric contraindications to discharge at this time  ## Behavioral / Environmental: -Delirium Precautions: Delirium Interventions for Nursing and Staff: - RN to open blinds every AM. - To Bedside: Glasses, hearing aide, and pt's own shoes. Make available to patients. when possible and encourage use. - Encourage po fluids when appropriate, keep fluids within reach. - OOB to chair with meals. - Passive ROM exercises to all extremities with AM & PM care. - RN to assess orientation to person, time and place QAM and PRN. - Recommend extended visitation hours with familiar family/friends as feasible. - Staff to minimize disturbances at night. Turn off television when pt asleep or when not in use.   ## Safety and Observation Level:  - Based on my clinical evaluation, I estimate the patient to be at moderate risk of self harm in the current setting due to agitation. - At this time, we recommend  safety observation. This decision is based on my review of the chart including patient's history and current presentation, interview of the patient, mental status examination, and consideration of suicide risk including evaluating suicidal ideation, plan, intent, suicidal or self-harm behaviors, risk factors, and protective factors. This judgment is based on our ability to directly address suicide risk, implement suicide prevention strategies, and develop a safety plan while the patient is in the clinical setting. Please contact our team if there is a concern that risk level has changed.  CSSR Risk Category:C-SSRS RISK CATEGORY: No Risk  Suicide Risk Assessment: Patient has following modifiable risk factors for suicide: recklessness, which we are addressing by optimizing his medications Patient has following non-modifiable or demographic risk factors for suicide: male gender Patient has the following protective factors against suicide:  Supportive family, no history of suicide attempts, and no history of NSSIB  Thank you for this consult request. Recommendations have been communicated to the primary team.  We will continue to follow at this time.   Corean Minor, MD, PGY-2       History of Present Illness  Relevant Aspects of [facility] Course:  Admitted on 07/15/2023 for acute agitated encephalopathy.  -Required precedex   -Acute encephalopathy thought to be sepsis related ?AKI vs CKD   ON events:  Received ativan  overnight, unclear why. Noted to have leukocytosis, soft BP this AM.   Patient Report:  Patient is somnolent, will arouse to voice but otherwise is not able to respond to questions.   Psych ROS:  Depression: reports always had depression Anxiety: reports anxiety  Mania (lifetime and current): denies  Psychosis: (lifetime and current): none  Collateral information:  Contacted daughter [name] at [phone] on 2/5 She reports when he was discharged from last hospitalization, he was confused but pleasant and calm. Was with him when he was admitted to hospital. States he is not himself. States his agitation beforehand is if they were taking a long time to get his medication. He was more confused and didn't understand situation. Called her this morning and was with him last night, called her at 9pm. He was asking if she was coming to pick him up. He seemed confused about where he was last night. He seems a little more confused. Prior to admission, he was falling  on the floor, trying to pee everywhere. Was not realizing where he was. On Monday nursing home told her he kept on falling in the floor. He didn't call her as he usually does. Still occasionally staring off when she was with him in the hospital. Trying to feed himself but weak. Held his pain medication. She reports she felt like he was a zombie yesterday. Sometimes need xanax  when anxious. Yesterday he was telling her to clean up ice cream on floor.  She thought perhaps he was bipolar. He can flip a switch really quickly, gets agitated easily. If he doesn't get what he wants he gets really mad, has always been like that. He's been a functional adult until his health declined. Struggles with anxiety and depression from his health decline. Reports only psychotropic she is aware that he used to take was lexapro  before all of his medical conditions.   ROS  Unable to obtain due to patient mental status  Psychiatric and Social History  Psychiatric History:  Information collected from patient, chart review  Prev Dx/Sx: MDD Current Psych Provider: none Home Meds (current): scheduled lexapro  20, oxy 15 TID, lyrica  25 TID, 100 BID, seroquel  100 at bedtime, PRN haldol  5 Q6H, oxy 5 Q6H Previous Med Trials: wellbutrin , cymbalta , gabapentin  Therapy: unknown  Prior Psych Hospitalization: denies  Prior Self Harm: denies Prior Violence: denies  Family Psych History: denies, reports sister struggles with substance use  Family Hx suicide: unknown  Social History:  Living Situation: Lives at [facility] for several years, no access to anything else  Access to weapons/lethal means: none   Substance History Denies substance use history, UDS+opiates, BZD. Patient required ativan , versed .  -In the past he used marijuana frequently, but then became dependent on pain meds   Exam Findings  Vital Signs:  Temp:  [98.2 F (36.8 C)-99.2 F (37.3 C)] 98.5 F (36.9 C) (02/07 0836) Pulse Rate:  [51-83] 70 (02/07 1033) Resp:  [18] 18 (02/07 1033) BP: (80-160)/(50-87) 91/53 (02/07 1033) SpO2:  [97 %-100 %] 98 % (02/07 1033) Weight:  [84 kg] 84 kg (02/07 0500) Blood pressure (!) 91/53, pulse 70, temperature 98.5 F (36.9 C), temperature source Oral, resp. rate 18, height 6' (1.829 m), weight 84 kg, SpO2 98%. Body mass index is 25.12 kg/m.  Physical Exam Constitutional:      Appearance: He is obese.  HENT:     Head: Normocephalic.  Pulmonary:      Effort: Pulmonary effort is normal.  Neurological:     Mental Status: He is alert.    Mental Status Exam: unable to obtain due to somnolence General Appearance: Casual  Orientation:  Other:  unable to obtainl  Memory:   unable to obtain  Concentration:  Concentration: Poor  Recall:  Poor  Attention  Poor  Eye Contact:  Poor  Speech:  Unable to obtain  Language:  Poor  Volume:   None  Mood: unable to obtain  Affect:   Somnolent  Thought Process:  unable to obtain   Thought Content:   unable to obtain  Suicidal Thoughts:  unable to obtain  Homicidal Thoughts:  unable to obtain  Judgement:  unable to obtain  Insight:  unable to obtain  Psychomotor Activity:  Decreased  Akathisia:  No  Fund of Knowledge:  Unable to obtain      Assets: unable to obtain  Cognition:  unable to obtain  ADL's:  unable to obtain  AIMS (if indicated):  Other History   These have been pulled in through the EMR, reviewed, and updated if appropriate.  Family History:  The patient's family history includes Bone cancer in his father; Cancer in his father, mother, paternal grandfather, and another family member; Diabetes in his mother and another family member; Heart disease in his mother; Prostate cancer in his father.  Medical History: 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    Chronic pain syndrome    COPD (chronic obstructive pulmonary disease) (HCC)    DDD (degenerative disc disease), lumbar     Depression    major depressive disorder    Diabetes mellitus without complication (HCC)    type 2    Diabetic ulcer of toe of left foot (HCC) 10/02/2016   Difficult intubation    Difficulty in walking, not elsewhere classified    Epidural abscess 10/02/2016   Foot drop, bilateral    Foot drop, right 12/27/2015   GERD (gastroesophageal reflux disease)    Herpesviral vesicular dermatitis    History of COVID-19 03/15/2022   Hyperlipidemia    Hyperlipidemia    Hypertension    Insomnia    Malignant melanoma of other parts of face (HCC)    Melanoma (HCC)    MRSA bacteremia    Muscle weakness (generalized)    Nasal congestion    Necrosis of toe (HCC) 07/08/2018   Neuromuscular dysfunction of bladder    Osteomyelitis (HCC)    Osteomyelitis (HCC)    Osteomyelitis (HCC)    right BKA   Other idiopathic peripheral autonomic neuropathy    Retention of urine, unspecified    Squamous cell carcinoma of skin    Stroke (HCC)    Unsteadiness on feet    Urinary retention    Urinary retention    Urinary tract infection    Wears dentures    Wears glasses     Surgical History: Past Surgical History:  Procedure Laterality Date   AMPUTATION Left 03/29/2017   Procedure: LEFT GREAT TOE AMPUTATION AT METATARSOPHALANGEAL JOINT;  Surgeon: Harden Jerona GAILS, MD;  Location: [facility] OR;  Service: Orthopedics;  Laterality: Left;   AMPUTATION Right 03/29/2017   Procedure: RIGHT BELOW KNEE AMPUTATION;  Surgeon: Harden Jerona GAILS, MD;  Location: [facility] OR;  Service: Orthopedics;  Laterality: Right;   AMPUTATION Left 06/06/2018   Procedure: LEFT 2ND TOE AMPUTATION;  Surgeon: Harden Jerona GAILS, MD;  Location: [facility] OR;  Service: Orthopedics;  Laterality: Left;   AMPUTATION Right 11/19/2018   Procedure: AMPUTATION ABOVE KNEE;  Surgeon: Harden Jerona GAILS, MD;  Location: [facility] OR;  Service: Orthopedics;  Laterality: Right;   ANTERIOR CERVICAL CORPECTOMY N/A 11/25/2015   Procedure: ANTERIOR CERVICAL FIVE CORPECTOMY Cervical four - six  fusion;  Surgeon: Gerldine Maizes, MD;  Location: [facility];  Service: Neurosurgery;  Laterality: N/A;  ANTERIOR CERVICAL FIVE CORPECTOMY Cervical four - six fusion  APPLICATION OF WOUND VAC Right 10/22/2018   Procedure: Application Of Wound Vac;  Surgeon: Harden Jerona GAILS, MD;  Location: [facility] OR;  Service: Orthopedics;  Laterality: Right;   CYSTOSCOPY WITH BIOPSY N/A 05/01/2022   Procedure: CYSTOSCOPY WITH URETHRAL BIOPSY;  Surgeon: Elisabeth Valli BIRCH, MD;  Location: [facility] ORS;  Service: Urology;  Laterality: N/A;  1 HR   CYSTOSCOPY WITH RETROGRADE URETHROGRAM N/A 04/25/2018   Procedure: CYSTOSCOPY WITH RETROGRADE URETHROGRAM/ BALLOON DILATION;  Surgeon: Ottelin, Mark, MD;  Location: [facility] ORS;  Service: Urology;  Laterality: N/A;   CYSTOSCOPY WITH URETHRAL DILATATION N/A 05/01/2022   Procedure: BALLOON DILATION WITH  OPTILUME;  Surgeon: Elisabeth Valli BIRCH, MD;  Location: [facility] ORS;  Service: Urology;  Laterality: N/A;   LAPAROTOMY N/A 12/03/2022   Procedure: EXPLORATORY LAPAROTOMY  total abdominal colectomy with end ileostomy;  Surgeon: Kinsinger, Herlene Righter, MD;  Location: [facility] OR;  Service: General;  Laterality: N/A;   MULTIPLE TOOTH EXTRACTIONS     POSTERIOR CERVICAL FUSION/FORAMINOTOMY N/A 11/29/2015   Procedure: Cervical Three-Cervical Seven Posterior Cervical Laminectomy with Fusion;  Surgeon: Gerldine Maizes, MD;  Location: [facility];  Service: Neurosurgery;  Laterality: N/A;  Cervical Three-Cervical Seven Posterior Cervical Laminectomy with Fusion   STUMP REVISION Right 10/22/2018   Procedure: REVISION RIGHT BELOW KNEE AMPUTATION;  Surgeon: Harden Jerona GAILS, MD;  Location: [facility] OR;  Service: Orthopedics;  Laterality: Right;   STUMP REVISION Right 12/05/2018   Procedure: Revision Right Above Knee Amputation;  Surgeon: Harden Jerona GAILS, MD;  Location: [facility] OR;  Service: Orthopedics;  Laterality: Right;   STUMP REVISION Right 05/29/2019   Procedure: REVISION RIGHT ABOVE KNEE AMPUTATION;  Surgeon: Harden Jerona GAILS,  MD;  Location: [facility] OR;  Service: Orthopedics;  Laterality: Right;   STUMP REVISION Right 06/24/2019   Procedure: STUMP REVISION;  Surgeon: Harden Jerona GAILS, MD;  Location: [facility] OR;  Service: Orthopedics;  Laterality: Right;     Medications:   Current Facility-Administered Medications:    acetaminophen  (TYLENOL ) tablet 650 mg, 650 mg, Oral, Q6H, Claudene Toribio BROCKS, MD, 650 mg at 07/26/23 0600   arformoterol  (BROVANA ) nebulizer solution 15 mcg, 15 mcg, Nebulization, BID, Agarwala, Ravi, MD, 15 mcg at 07/26/23 0846   aspirin  EC tablet 81 mg, 81 mg, Oral, Daily, Hunsucker, Donnice SAUNDERS, MD, 81 mg at 07/26/23 1028   atorvastatin  (LIPITOR ) tablet 80 mg, 80 mg, Oral, QHS, Autry, Lauren E, [name], 80 mg at 07/25/23 2125   budesonide  (PULMICORT ) nebulizer solution 0.5 mg, 0.5 mg, Nebulization, BID, Payne, John D, [name], 0.5 mg at 07/26/23 9153   carvedilol  (COREG ) tablet 12.5 mg, 12.5 mg, Oral, BID WC, Autry, Lauren E, [name], 12.5 mg at 07/25/23 0901   Chlorhexidine  Gluconate Cloth 2 % PADS 6 each, 6 each, Topical, Daily, Claudene Toribio BROCKS, MD, 6 each at 07/25/23 1237   divalproex  (DEPAKOTE ) DR tablet 500 mg, 500 mg, Oral, Q8H, Clark, Laura P, DO, 500 mg at 07/26/23 0600   docusate sodium  (COLACE) capsule 100 mg, 100 mg, Oral, BID PRN, Payne, John D, [name]   escitalopram  (LEXAPRO ) tablet 20 mg, 20 mg, Oral, Daily, Autry, Lauren E, [name], 20 mg at 07/26/23 1027   famotidine  (PEPCID ) tablet 20 mg, 20 mg, Oral, Daily, Emilio, John D, [name], 20 mg at 07/26/23 1027   feeding supplement (ENSURE ENLIVE / ENSURE PLUS) liquid 237 mL, 237 mL, Oral, BID BM, Hunsucker, Donnice SAUNDERS, MD, 237 mL at 07/25/23 0906   haloperidol  lactate (HALDOL ) injection 5 mg, 5 mg, Intravenous, Q6H PRN,  Hunsucker, Donnice SAUNDERS, MD, 5 mg at 07/25/23 1658   haloperidol  lactate (HALDOL ) injection 5 mg, 5 mg, Intramuscular, Once, Paliwal, Aditya, MD   heparin  injection 5,000 Units, 5,000 Units, Subcutaneous, Q8H, Payne, John D, [name], 5,000 Units at 07/26/23 9447    hydrALAZINE  (APRESOLINE ) injection 10-40 mg, 10-40 mg, Intravenous, Q4H PRN, Payne, John D, [name]   hydrALAZINE  (APRESOLINE ) tablet 10 mg, 10 mg, Oral, Q8H, Autry, Lauren E, [name], 10 mg at 07/25/23 2126   HYDROmorphone  (DILAUDID ) injection 1 mg, 1 mg, Intravenous, Q4H PRN, Claudene Toribio BROCKS, MD, 1 mg at 07/25/23 1818   HYDROmorphone  (DILAUDID ) tablet 2 mg, 2 mg, Oral, Q6H, Claudene Toribio BROCKS, MD, 2 mg at 07/26/23 9964   ipratropium-albuterol  (DUONEB) 0.5-2.5 (3) MG/3ML nebulizer solution 3 mL, 3 mL, Nebulization, Q4H PRN, Payne, John D, [name]   Oral care mouth rinse, 15 mL, Mouth Rinse, PRN, Ramaswamy, Murali, MD   pantoprazole  (PROTONIX ) EC tablet 40 mg, 40 mg, Oral, Daily, Autry, Lauren E, [name], 40 mg at 07/26/23 1027   polyethylene glycol (MIRALAX  / GLYCOLAX ) packet 17 g, 17 g, Oral, Daily PRN, Emilio, John D, [name]   pregabalin  (LYRICA ) capsule 200 mg, 200 mg, Oral, BID, Gretta Leita SQUIBB, DO, 200 mg at 07/26/23 1028   QUEtiapine  (SEROQUEL ) tablet 400 mg, 400 mg, Oral, QHS, Brihany Butch, MD, 400 mg at 07/25/23 2125   revefenacin  (YUPELRI ) nebulizer solution 175 mcg, 175 mcg, Nebulization, Daily, Payne, John D, [name], 175 mcg at 07/26/23 9152   sodium bicarbonate  tablet 650 mg, 650 mg, Oral, BID, Hunsucker, Donnice SAUNDERS, MD, 650 mg at 07/26/23 1027   [COMPLETED] thiamine  (VITAMIN B1) 500 mg in sodium chloride  0.9 % 50 mL IVPB, 500 mg, Intravenous, Q8H, Last Rate: 110 mL/hr at 07/21/23 0600, Infusion Verify at 07/21/23 0600 **FOLLOWED BY** thiamine  (VITAMIN B1) tablet 100 mg, 100 mg, Oral, Daily, Claudene Toribio BROCKS, MD, 100 mg at 07/26/23 1028  Allergies: Allergies  Allergen Reactions   Latex Other (See Comments)    Unknown reaction    Tieshia Rettinger, MD, PGY-2

## 2023-07-27 DIAGNOSIS — G9340 Encephalopathy, unspecified: Secondary | ICD-10-CM | POA: Diagnosis not present

## 2023-07-27 LAB — MAGNESIUM: Magnesium: 2 mg/dL (ref 1.7–2.4)

## 2023-07-27 LAB — AMMONIA: Ammonia: 14 umol/L (ref 9–35)

## 2023-07-27 LAB — CBC
HCT: 29.3 % — ABNORMAL LOW (ref 39.0–52.0)
Hemoglobin: 9.3 g/dL — ABNORMAL LOW (ref 13.0–17.0)
MCH: 29.5 pg (ref 26.0–34.0)
MCHC: 31.7 g/dL (ref 30.0–36.0)
MCV: 93 fL (ref 80.0–100.0)
Platelets: 140 10*3/uL — ABNORMAL LOW (ref 150–400)
RBC: 3.15 MIL/uL — ABNORMAL LOW (ref 4.22–5.81)
RDW: 17.2 % — ABNORMAL HIGH (ref 11.5–15.5)
WBC: 13.5 10*3/uL — ABNORMAL HIGH (ref 4.0–10.5)
nRBC: 0 % (ref 0.0–0.2)

## 2023-07-27 LAB — BASIC METABOLIC PANEL
Anion gap: 11 (ref 5–15)
BUN: 26 mg/dL — ABNORMAL HIGH (ref 8–23)
CO2: 20 mmol/L — ABNORMAL LOW (ref 22–32)
Calcium: 8.6 mg/dL — ABNORMAL LOW (ref 8.9–10.3)
Chloride: 105 mmol/L (ref 98–111)
Creatinine, Ser: 2.33 mg/dL — ABNORMAL HIGH (ref 0.61–1.24)
GFR, Estimated: 30 mL/min — ABNORMAL LOW (ref >60)
Glucose, Bld: 74 mg/dL (ref 70–99)
Potassium: 5 mmol/L (ref 3.5–5.1)
Sodium: 136 mmol/L (ref 135–145)

## 2023-07-27 LAB — GLUCOSE, CAPILLARY: Glucose-Capillary: 126 mg/dL — ABNORMAL HIGH (ref 70–99)

## 2023-07-27 MED ORDER — TAMSULOSIN HCL 0.4 MG PO CAPS
0.4000 mg | ORAL_CAPSULE | Freq: Every day | ORAL | Status: DC
  Administered 2023-07-27 – 2023-08-18 (×20): 0.4 mg via ORAL
  Filled 2023-07-27 (×25): qty 1

## 2023-07-27 MED ORDER — FUROSEMIDE 10 MG/ML IJ SOLN
40.0000 mg | Freq: Once | INTRAMUSCULAR | Status: AC
  Administered 2023-07-27: 40 mg via INTRAVENOUS
  Filled 2023-07-27: qty 4

## 2023-07-27 MED ORDER — SODIUM CHLORIDE 0.9 % IV BOLUS
500.0000 mL | Freq: Once | INTRAVENOUS | Status: AC
  Administered 2023-07-27: 500 mL via INTRAVENOUS

## 2023-07-27 MED ORDER — QUETIAPINE FUMARATE 100 MG PO TABS
200.0000 mg | ORAL_TABLET | Freq: Every day | ORAL | Status: DC
  Administered 2023-07-27 – 2023-07-30 (×4): 200 mg via ORAL
  Filled 2023-07-27 (×4): qty 2

## 2023-07-27 MED ORDER — LORAZEPAM 2 MG/ML IJ SOLN
1.0000 mg | Freq: Once | INTRAMUSCULAR | Status: AC
  Administered 2023-07-27: 1 mg via INTRAVENOUS
  Filled 2023-07-27: qty 1

## 2023-07-27 NOTE — Consult Note (Signed)
 [facility] Psychiatric Consult Follow-up  Patient Name: .Gregory [name]  MRN: [id number]  DOB: [birth year]  Consult Order details:  Orders (From admission, onward)     Start     Ordered   07/23/23 1704  IP CONSULT TO PSYCHIATRY       Ordering Provider: Rosario Leatrice FERNS, MD  Provider:  (Not yet assigned)  Question Answer Comment  Location [facility]   Reason for Consult? Severe agitation      07/23/23 1703            Mode of Visit: In person   Psychiatry Consult Evaluation  Service Date: July 27, 2023 LOS:  LOS: 11 days  Chief Complaint I'm in pain everywhere  Primary Psychiatric Diagnoses  Hyperactive delirium  2.  Somatic symptom disorder with predominant pain 3. History of MDD   Assessment  Gregory [name] is a 63 y.o. male admitted: Medicallyfor 07/15/2023  3:05 PM for acute agitated encephalopathy. 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   His current presentation of inattention, waxing and waning mental status, change over the last couple of days is most consistent with hyperactive delirium likely in the setting of multiple medications and uncontrolled pain. There is suspicion for somatic symptom disorder with predominant pain given chart review notable for multiple outpatient visits to chronic pain physicians and continued use of oxy throughout the years though notably nothing on PDMP. Main psychiatric diagnosis over the years has been depression/anxiety and patient and collateral denies any past psychiatric hospitalizations or suicide attempts. He reports depression but is unable to state what medications he has been on and whether or not they have helped. Current outpatient psychotropic medications from last admission included scheduled  lexapro  20, oxy 15 TID, lyrica  25 TID, 100 BID, seroquel  100 at bedtime, PRN haldol  5 Q6H, oxy 5 Q6H and historically he was fully alert and mostly oriented on these medications. He was likely compliant with medications prior to admission since he was at the nursing home. On initial examination, patient is alert, irritable, is not able to participate in sustained attention testing and is reporting pain. Will recommend decreasing seroquel , stopping klonopin , continuing depakote , making sure his pain is well controlled. I suspect that he has a low frustration tolerance with regards to his pain which contributes to agitation. On follow-up examination, patient was somnolent and only briefly aroused to voice. Per nursing, patient received ativan  and dilaudid  on and also noted to have soft BP and leukocytosis. Wonder if medications or infection could be contributing to delirium as well.   On reassessment today, patient remains somewhat oversedated and unable to participate in assessment. He responds to voice and to light touch by briefly opening his eyes and immediately closing it up. Patient has remained sleepy for a few days without major agitation. As the hyperactive delirium resolves, the current regimen needs too be reviewed. We will continue on Depakote , Lexapro  and reduce the Seroquel  from 400 mg to 200 mg at bedtime. Per nursing, dilaudid  has been held and carvedilol  and hydralazine  were also held for soft B/P. Patient has also been started on antibiotic therapy for Acute urinary retention possibly from UTI. Recommend further work-up for medical causes of AMS. Will continue current medication regimen. Please see plan below for detailed  recommendations.   Diagnoses:  Active Hospital problems: Principal Problem:   Acute encephalopathy Active Problems:   Malnutrition of moderate degree    Plan   ## Psychiatric Medication Recommendations:  --decrease seroquel  to 200 QHS for mood stabilization and  insomnia given continued somnolence and concern for polypharmacy, consider decrease if continued somnolence without agitation --continue lexapro  20 daily for depression  --stop klonopin  1 BID for agitation  --continue depakote  500 Q8H for mood stabilization, can obtain depakote  level in a couple days if patient continues on medication and is still in the hospital --optimize pain regimen   ## Medical Decision Making Capacity: Not specifically addressed in this encounter  ## Further Work-up:  --B12, folate  --f/u ammonia level -- most recent EKG on 2/3 had QtC of 413 -- Pertinent labwork reviewed earlier this admission includes: CMP (Cr 1.52), CBC, TSH, UDS  ## Disposition:-- There are no psychiatric contraindications to discharge at this time  ## Behavioral / Environmental: -Delirium Precautions: Delirium Interventions for Nursing and Staff: - RN to open blinds every AM. - To Bedside: Glasses, hearing aide, and pt's own shoes. Make available to patients. when possible and encourage use. - Encourage po fluids when appropriate, keep fluids within reach. - OOB to chair with meals. - Passive ROM exercises to all extremities with AM & PM care. - RN to assess orientation to person, time and place QAM and PRN. - Recommend extended visitation hours with familiar family/friends as feasible. - Staff to minimize disturbances at night. Turn off television when pt asleep or when not in use.   ## Safety and Observation Level:  - Based on my clinical evaluation, I estimate the patient to be at moderate risk of self harm in the current setting due to agitation. - At this time, we recommend  safety observation. This decision is based on my review of the chart including patient's history and current presentation, interview of the patient, mental status examination, and consideration of suicide risk including evaluating suicidal ideation, plan, intent, suicidal or self-harm behaviors, risk factors, and protective  factors. This judgment is based on our ability to directly address suicide risk, implement suicide prevention strategies, and develop a safety plan while the patient is in the clinical setting. Please contact our team if there is a concern that risk level has changed.  CSSR Risk Category:C-SSRS RISK CATEGORY: No Risk  Suicide Risk Assessment: Patient has following modifiable risk factors for suicide: recklessness, which we are addressing by optimizing his medications Patient has following non-modifiable or demographic risk factors for suicide: male gender Patient has the following protective factors against suicide: Supportive family, no history of suicide attempts, and no history of NSSIB  Thank you for this consult request. Recommendations have been communicated to the primary team.  We will continue to follow at this time.   LAWERENCE GUT, NP       History of Present Illness  Relevant Aspects of [facility] Course:  Admitted on 07/15/2023 for acute agitated encephalopathy.  -Required precedex   -Acute encephalopathy thought to be sepsis related ?AKI vs CKD   ON events:  Received ativan  overnight, unclear why. Noted to have leukocytosis, soft BP this AM.   Patient Report:  Patient is somnolent, will arouse to voice and light touch but otherwise is not able to respond to questions.   Psych ROS:  Depression: reports always had depression Anxiety: reports anxiety  Mania (lifetime and current): denies  Psychosis: (lifetime and current): none  Collateral information:  Contacted  daughter [name] at [phone] on 2/5 She reports when he was discharged from last hospitalization, he was confused but pleasant and calm. Was with him when he was admitted to hospital. States he is not himself. States his agitation beforehand is if they were taking a long time to get his medication. He was more confused and didn't understand situation. Called her this morning and was with him last night,  called her at 9pm. He was asking if she was coming to pick him up. He seemed confused about where he was last night. He seems a little more confused. Prior to admission, he was falling on the floor, trying to pee everywhere. Was not realizing where he was. On Monday nursing home told her he kept on falling in the floor. He didn't call her as he usually does. Still occasionally staring off when she was with him in the hospital. Trying to feed himself but weak. Held his pain medication. She reports she felt like he was a zombie yesterday. Sometimes need xanax  when anxious. Yesterday he was telling her to clean up ice cream on floor. She thought perhaps he was bipolar. He can flip a switch really quickly, gets agitated easily. If he doesn't get what he wants he gets really mad, has always been like that. He's been a functional adult until his health declined. Struggles with anxiety and depression from his health decline. Reports only psychotropic she is aware that he used to take was lexapro  before all of his medical conditions.   ROS  Unable to obtain due to patient mental status  Psychiatric and Social History  Psychiatric History:  Information collected from patient, chart review  Prev Dx/Sx: MDD Current Psych Provider: none Home Meds (current): scheduled lexapro  20, oxy 15 TID, lyrica  25 TID, 100 BID, seroquel  100 at bedtime, PRN haldol  5 Q6H, oxy 5 Q6H Previous Med Trials: wellbutrin , cymbalta , gabapentin  Therapy: unknown  Prior Psych Hospitalization: denies  Prior Self Harm: denies Prior Violence: denies  Family Psych History: denies, reports sister struggles with substance use  Family Hx suicide: unknown  Social History:  Living Situation: Lives at [facility] for several years, no access to anything else  Access to weapons/lethal means: none   Substance History Denies substance use history, UDS+opiates, BZD. Patient required ativan , versed .  -In the past he used marijuana  frequently, but then became dependent on pain meds   Exam Findings  Vital Signs:  Temp:  [98.4 F (36.9 C)-98.7 F (37.1 C)] 98.6 F (37 C) (02/08 0500) Pulse Rate:  [71-79] 71 (02/08 1056) Resp:  [16-17] 16 (02/08 1056) BP: (95-99)/(50-63) 96/55 (02/08 1056) SpO2:  [90 %-98 %] 93 % (02/08 1056) Weight:  [82.4 kg] 82.4 kg (02/08 0500) Blood pressure (!) 96/55, pulse 71, temperature 98.6 F (37 C), temperature source Oral, resp. rate 16, height 6' (1.829 m), weight 82.4 kg, SpO2 93%. Body mass index is 24.64 kg/m.  Physical Exam Constitutional:      Appearance: He is obese.  HENT:     Head: Normocephalic.  Pulmonary:     Effort: Pulmonary effort is normal.  Neurological:     Mental Status: He is alert.    Mental Status Exam: unable to obtain due to somnolence General Appearance: Casual  Orientation:  Other:  unable to obtainl  Memory:   unable to obtain  Concentration:  Concentration: Poor  Recall:  Poor  Attention  Poor  Eye Contact:  Poor  Speech:  Unable to obtain  Language:  Poor  Volume:   None  Mood: unable to obtain  Affect:   Somnolent  Thought Process:  unable to obtain   Thought Content:   unable to obtain  Suicidal Thoughts:  unable to obtain  Homicidal Thoughts:  unable to obtain  Judgement:  unable to obtain  Insight:  unable to obtain  Psychomotor Activity:  Decreased  Akathisia:  No  Fund of Knowledge:  Unable to obtain      Assets: unable to obtain  Cognition:  unable to obtain  ADL's:  unable to obtain  AIMS (if indicated):        Other History   These have been pulled in through the EMR, reviewed, and updated if appropriate.  Family History:  The patient's family history includes Bone cancer in his father; Cancer in his father, mother, paternal grandfather, and another family member; Diabetes in his mother and another family member; Heart disease in his mother; Prostate cancer in his father.  Medical History: Past Medical History:   Diagnosis Date   Acquired absence of left great toe (HCC)    Acquired absence of right leg below knee (HCC)    Acute osteomyelitis of calcaneum, right (HCC) 10/02/2016   Acute respiratory failure (HCC)    Amputation stump infection (HCC) 08/12/2018   Anemia    Basal cell carcinoma, eyelid    Cancer (HCC)    Candidiasis 08/12/2018   CHF (congestive heart failure) (HCC)    chronic diastolic    Chronic back pain    Chronic kidney disease    stage 3    Chronic multifocal osteomyelitis (HCC)    of ankle and foot    Chronic pain syndrome    COPD (chronic obstructive pulmonary disease) (HCC)    DDD (degenerative disc disease), lumbar    Depression    major depressive disorder    Diabetes mellitus without complication (HCC)    type 2    Diabetic ulcer of toe of left foot (HCC) 10/02/2016   Difficult intubation    Difficulty in walking, not elsewhere classified    Epidural abscess 10/02/2016   Foot drop, bilateral    Foot drop, right 12/27/2015   GERD (gastroesophageal reflux disease)    Herpesviral vesicular dermatitis    History of COVID-19 03/15/2022   Hyperlipidemia    Hyperlipidemia    Hypertension    Insomnia    Malignant melanoma of other parts of face (HCC)    Melanoma (HCC)    MRSA bacteremia    Muscle weakness (generalized)    Nasal congestion    Necrosis of toe (HCC) 07/08/2018   Neuromuscular dysfunction of bladder    Osteomyelitis (HCC)    Osteomyelitis (HCC)    Osteomyelitis (HCC)    right BKA   Other idiopathic peripheral autonomic neuropathy    Retention of urine, unspecified    Squamous cell carcinoma of skin    Stroke (HCC)    Unsteadiness on feet    Urinary retention    Urinary retention    Urinary tract infection    Wears dentures    Wears glasses     Surgical History: Past Surgical History:  Procedure Laterality Date   AMPUTATION Left 03/29/2017   Procedure: LEFT GREAT TOE AMPUTATION AT METATARSOPHALANGEAL JOINT;  Surgeon: Harden Jerona GAILS,  MD;  Location: [facility] OR;  Service: Orthopedics;  Laterality: Left;   AMPUTATION Right 03/29/2017   Procedure: RIGHT BELOW KNEE AMPUTATION;  Surgeon: Harden Jerona GAILS, MD;  Location: [facility] OR;  Service: Orthopedics;  Laterality: Right;   AMPUTATION Left 06/06/2018   Procedure: LEFT 2ND TOE AMPUTATION;  Surgeon: Harden Jerona GAILS, MD;  Location: [facility] OR;  Service: Orthopedics;  Laterality: Left;   AMPUTATION Right 11/19/2018   Procedure: AMPUTATION ABOVE KNEE;  Surgeon: Harden Jerona GAILS, MD;  Location: [facility] OR;  Service: Orthopedics;  Laterality: Right;   ANTERIOR CERVICAL CORPECTOMY N/A 11/25/2015   Procedure: ANTERIOR CERVICAL FIVE CORPECTOMY Cervical four - six fusion;  Surgeon: Gerldine Maizes, MD;  Location: [facility];  Service: Neurosurgery;  Laterality: N/A;  ANTERIOR CERVICAL FIVE CORPECTOMY Cervical four - six fusion   APPLICATION OF WOUND VAC Right 10/22/2018   Procedure: Application Of Wound Vac;  Surgeon: Harden Jerona GAILS, MD;  Location: [facility] OR;  Service: Orthopedics;  Laterality: Right;   CYSTOSCOPY WITH BIOPSY N/A 05/01/2022   Procedure: CYSTOSCOPY WITH URETHRAL BIOPSY;  Surgeon: Elisabeth Valli BIRCH, MD;  Location: [facility] ORS;  Service: Urology;  Laterality: N/A;  1 HR   CYSTOSCOPY WITH RETROGRADE URETHROGRAM N/A 04/25/2018   Procedure: CYSTOSCOPY WITH RETROGRADE URETHROGRAM/ BALLOON DILATION;  Surgeon: Ottelin, Mark, MD;  Location: [facility] ORS;  Service: Urology;  Laterality: N/A;   CYSTOSCOPY WITH URETHRAL DILATATION N/A 05/01/2022   Procedure: BALLOON DILATION WITH  OPTILUME;  Surgeon: Elisabeth Valli BIRCH, MD;  Location: [facility] ORS;  Service: Urology;  Laterality: N/A;   LAPAROTOMY N/A 12/03/2022   Procedure: EXPLORATORY LAPAROTOMY  total abdominal colectomy with end ileostomy;  Surgeon: Kinsinger, Herlene Righter, MD;  Location: [facility] OR;  Service: General;  Laterality: N/A;   MULTIPLE TOOTH EXTRACTIONS     POSTERIOR CERVICAL FUSION/FORAMINOTOMY N/A 11/29/2015   Procedure: Cervical Three-Cervical Seven Posterior Cervical  Laminectomy with Fusion;  Surgeon: Gerldine Maizes, MD;  Location: [facility];  Service: Neurosurgery;  Laterality: N/A;  Cervical Three-Cervical Seven Posterior Cervical Laminectomy with Fusion   STUMP REVISION Right 10/22/2018   Procedure: REVISION RIGHT BELOW KNEE AMPUTATION;  Surgeon: Harden Jerona GAILS, MD;  Location: [facility] OR;  Service: Orthopedics;  Laterality: Right;   STUMP REVISION Right 12/05/2018   Procedure: Revision Right Above Knee Amputation;  Surgeon: Harden Jerona GAILS, MD;  Location: [facility] OR;  Service: Orthopedics;  Laterality: Right;   STUMP REVISION Right 05/29/2019   Procedure: REVISION RIGHT ABOVE KNEE AMPUTATION;  Surgeon: Harden Jerona GAILS, MD;  Location: [facility] OR;  Service: Orthopedics;  Laterality: Right;   STUMP REVISION Right 06/24/2019   Procedure: STUMP REVISION;  Surgeon: Harden Jerona GAILS, MD;  Location: [facility] OR;  Service: Orthopedics;  Laterality: Right;     Medications:   Current Facility-Administered Medications:    acetaminophen  (TYLENOL ) tablet 650 mg, 650 mg, Oral, Q6H, Claudene Toribio BROCKS, MD, 650 mg at 07/27/23 1222   arformoterol  (BROVANA ) nebulizer solution 15 mcg, 15 mcg, Nebulization, BID, Agarwala, Ravi, MD, 15 mcg at 07/27/23 0859   aspirin  EC tablet 81 mg, 81 mg, Oral, Daily, Hunsucker, Donnice SAUNDERS, MD, 81 mg at 07/27/23 0945   atorvastatin  (LIPITOR ) tablet 80 mg, 80 mg, Oral, QHS, Autry, Lauren E, [name], 80 mg at 07/26/23 2124   budesonide  (PULMICORT ) nebulizer solution 0.5 mg, 0.5 mg, Nebulization, BID, Payne, John D, [name], 0.5 mg at 07/27/23 9140   carvedilol  (COREG ) tablet 12.5 mg, 12.5 mg, Oral, BID WC, Autry, Lauren E, [name], 12.5 mg at 07/25/23 0901   cefTRIAXone  (ROCEPHIN ) 1 g in sodium chloride  0.9 % 100 mL IVPB, 1 g, Intravenous, Q24H, Calkins, Derek W, DO, Last Rate: 200 mL/hr at 07/27/23 1434, 1 g at 07/27/23  1434   Chlorhexidine  Gluconate Cloth 2 % PADS 6 each, 6 each, Topical, Daily, Claudene Toribio BROCKS, MD, 6 each at 07/27/23 1226   divalproex  (DEPAKOTE ) DR tablet 500  mg, 500 mg, Oral, Q8H, Gretta Leita SQUIBB, DO, 500 mg at 07/27/23 1423   docusate sodium  (COLACE) capsule 100 mg, 100 mg, Oral, BID PRN, Payne, John D, [name]   escitalopram  (LEXAPRO ) tablet 20 mg, 20 mg, Oral, Daily, Autry, Lauren E, [name], 20 mg at 07/27/23 0944   famotidine  (PEPCID ) tablet 20 mg, 20 mg, Oral, Daily, Payne, John D, [name], 20 mg at 07/27/23 0944   feeding supplement (ENSURE ENLIVE / ENSURE PLUS) liquid 237 mL, 237 mL, Oral, BID BM, Hunsucker, Donnice SAUNDERS, MD, 237 mL at 07/27/23 0944   haloperidol  lactate (HALDOL ) injection 5 mg, 5 mg, Intravenous, Q6H PRN, Hunsucker, Donnice SAUNDERS, MD, 5 mg at 07/26/23 1226   haloperidol  lactate (HALDOL ) injection 5 mg, 5 mg, Intramuscular, Once, Paliwal, Aditya, MD   heparin  injection 5,000 Units, 5,000 Units, Subcutaneous, Q8H, Payne, John D, [name], 5,000 Units at 07/27/23 1422   hydrALAZINE  (APRESOLINE ) injection 10-40 mg, 10-40 mg, Intravenous, Q4H PRN, Payne, John D, [name]   hydrALAZINE  (APRESOLINE ) tablet 10 mg, 10 mg, Oral, Q8H, Autry, Lauren E, [name], 10 mg at 07/25/23 2126   HYDROmorphone  (DILAUDID ) injection 1 mg, 1 mg, Intravenous, Q4H PRN, Claudene Toribio BROCKS, MD, 1 mg at 07/25/23 1818   HYDROmorphone  (DILAUDID ) tablet 2 mg, 2 mg, Oral, Q6H, Claudene Toribio BROCKS, MD, 2 mg at 07/26/23 1803   ipratropium-albuterol  (DUONEB) 0.5-2.5 (3) MG/3ML nebulizer solution 3 mL, 3 mL, Nebulization, Q4H PRN, Emilio Norleen BIRCH, [name]   multivitamin with minerals tablet 1 tablet, 1 tablet, Oral, Daily, Arlon Carliss ORN, DO, 1 tablet at 07/27/23 0945   Oral care mouth rinse, 15 mL, Mouth Rinse, PRN, Ramaswamy, Murali, MD   pantoprazole  (PROTONIX ) EC tablet 40 mg, 40 mg, Oral, Daily, Autry, Lauren E, [name], 40 mg at 07/27/23 0944   polyethylene glycol (MIRALAX  / GLYCOLAX ) packet 17 g, 17 g, Oral, Daily PRN, Emilio, John D, [name]   pregabalin  (LYRICA ) capsule 200 mg, 200 mg, Oral, BID, Gretta Leita SQUIBB, DO, 200 mg at 07/27/23 9055   QUEtiapine  (SEROQUEL ) tablet 400 mg, 400 mg, Oral, QHS,  Chien, Stephanie, MD, 400 mg at 07/26/23 2124   revefenacin  (YUPELRI ) nebulizer solution 175 mcg, 175 mcg, Nebulization, Daily, Payne, John D, [name], 175 mcg at 07/27/23 9140   sodium bicarbonate  tablet 650 mg, 650 mg, Oral, BID, Hunsucker, Donnice SAUNDERS, MD, 650 mg at 07/27/23 9055   tamsulosin  (FLOMAX ) capsule 0.4 mg, 0.4 mg, Oral, Daily, Arlon Carliss W, DO, 0.4 mg at 07/27/23 1222   [COMPLETED] thiamine  (VITAMIN B1) 500 mg in sodium chloride  0.9 % 50 mL IVPB, 500 mg, Intravenous, Q8H, Last Rate: 110 mL/hr at 07/21/23 0600, Infusion Verify at 07/21/23 0600 **FOLLOWED BY** thiamine  (VITAMIN B1) tablet 100 mg, 100 mg, Oral, Daily, Claudene Toribio BROCKS, MD, 100 mg at 07/27/23 0944  Allergies: Allergies  Allergen Reactions   Latex Other (See Comments)    Unknown reaction    Sheilah Rayos, NP

## 2023-07-27 NOTE — Progress Notes (Signed)
 Progress Note   Patient: Gregory [name] FMW:[id number] DOB: 09/26/1960 DOA: 07/15/2023     11 DOS: the patient was seen and examined on 07/27/2023   Brief hospital course: 63 year old male with history of diabetes mellitus type 2, hypertension, hyperlipidemia, chronic kidney disease stage IIIb, COPD, chronic osteomyelitis status post right AKA, left midfoot amputation, unspecified stroke, A-fib, chronic diastolic heart failure, mesenteric ischemia status post colostomy and recent hospitalization from 06/27/2023 to 07/11/2023 for septic shock due to ESBL UTI and multifocal pneumonia with rhinovirus infection/acute respiratory failure with hypoxia requiring intubation and extubation/acute encephalopathy/acute ischemic stroke and subsequent discharge to SNF presented with severe agitation and delirium.  On presentation, CT of the head was negative for acute abnormalities.  Ammonia and VBG were unremarkable.  He was admitted to ICU and managed with Precedex .  He was transferred to [facility] service from 07/23/2023 onwards.   Assessment and Plan:  Acute metabolic encephalopathy/agitation and delirium -Initially admitted to ICU under PCCM service and treated with Precedex .  Ammonia, VBG and CT of the head were negative for acute intracranial abnormality. Care transferred to TRH service from 07/23/2023 onwards.  At appear to resolved at this time. -Psychiatry following closely.  Working to reduce sedative type medications which may be contributing to patient's delirium. -Initially show improvement however became more lethargic 2/7.  Underlying etiology include dehydration, medication effect, urinary retention, or even insidious infection.  Will monitor her encephalopathy and blood pressure closely.  Treatment as below.  Will attempt to hold any sedative type medications including opioids/benzos.  Hypotension - On 2/7, systolic blood pressures in the 80s.  Patient appeared more lethargic.  Unclear if from hypovolemia or  other etiology such as acute urinary retention.  IV fluid bolus given with excellent response, systolic blood pressures in the 130s now.  UA pending.  Monitor urine output.  Possible Urinary Tract Infection -UA appears cloudy, large amount of leukocytes and nitrate positive with noted bacteria.  Currently on day 2 of 5 empiric ceftriaxone .  Could just be bacteriuria and colonization from dehydration.  Will treat empirically given hypotension and lethargy.  Patient afebrile.  Concern for acute urinary retention - Etiology may be from UTI or vice versa.  Despite IV fluid bolus, creatinine creeping upward.  Will order I/O x 1, monitor urine output.  Flomax  initiated.  COPD -Stable.  Continue current nebulized regimen   Chronic systolic heart failure Hypertension Hyperlipidemia -Not appear to be in acute exacerbation.  Continue strict input and output.  Daily weights.  Fluid restriction.  Continue Coreg , hydralazine  and statin.  Outpatient follow-up with cardiology   Diabetes mellitus type 2 -Blood sugars controlled   Acute kidney injury CKD stage IIIb -Slightly worsened since yesterday.  Likely prerenal etiology.  Ordered NS as above.  Monitor urine output.  Will recheck BMP in a.m.   Hypomagnesemia -Recheck this morning showed low magnesium , 1.3.  Will order 2 g IV replenishment.  Will recheck magnesium  in a.m.   Chronic pain syndrome -Continue current regimen   Depression/anxiety -Continue current regimen.  Psychiatry consulted.  Follow recommendations   Anemia of chronic disease -Possibly from chronic kidney disease.  Hemoglobin stable.  Monitor intermittently   Goals of care -Patient evaluated by palliative care.  Patient continues to be full code.  Closely with case management and family on disposition planning.   Generalized deconditioning -PT recommending SNF placement.  Consult TOC      Subjective: Patient continues to be lethargic this morning.  Still arousable and  conversant but very sleepy.  Denies any pain, shortness of breath, nausea, vomiting.  Blood pressure showed improvement with IV fluid bolus yesterday, now settling into the mid high 90s systolic.  Afebrile overnight.    Physical Exam: Vitals:   07/27/23 0500 07/27/23 0744 07/27/23 0859 07/27/23 1056  BP: 96/60 (!) 99/57  (!) 96/55  Pulse: 79 75  71  Resp: 17 16  16   Temp: 98.6 F (37 C)     TempSrc: Oral     SpO2: 96% 97% 98% 93%  Weight: 82.4 kg     Height:       GENERAL:  Alert, lethargic, disheveled HEENT:  EOMI CARDIOVASCULAR: Regular and bradycardic, no murmurs appreciated RESPIRATORY:  Clear to auscultation, no wheezing, rales, or rhonchi GASTROINTESTINAL:  Soft, nontender, nondistended EXTREMITIES: Right AKA NEURO:  No new focal deficits appreciated, lethargic but rousable SKIN:  No rashes noted PSYCH:  Appropriate mood and affect   Data Reviewed:  There are no new results to review at this time.  Family Communication: None at bedside  Disposition: Status is: Inpatient Remains inpatient appropriate because: Continued IV medications and medication adjustment  Planned Discharge Destination: Skilled nursing facility    Time spent: 38 minutes  Author: Carliss LELON Canales, DO 07/27/2023 11:04 AM  For on call review [email or web address].

## 2023-07-27 NOTE — Plan of Care (Signed)

## 2023-07-27 NOTE — Progress Notes (Signed)
 Made MD aware patient seems to be very tired and hasn't work up much this morning. He is arousal with little conversation but will go back to sleep when no one is in the room with him. Plan is to hold any sedative medications at this time.

## 2023-07-27 NOTE — Progress Notes (Signed)
 PT given gram crackers and coke

## 2023-07-28 DIAGNOSIS — G9340 Encephalopathy, unspecified: Secondary | ICD-10-CM | POA: Diagnosis not present

## 2023-07-28 DIAGNOSIS — Z515 Encounter for palliative care: Secondary | ICD-10-CM | POA: Diagnosis not present

## 2023-07-28 DIAGNOSIS — Z7189 Other specified counseling: Secondary | ICD-10-CM | POA: Diagnosis not present

## 2023-07-28 LAB — GLUCOSE, CAPILLARY
Glucose-Capillary: 72 mg/dL (ref 70–99)
Glucose-Capillary: 167 mg/dL — ABNORMAL HIGH (ref 70–99)
Glucose-Capillary: 113 mg/dL — ABNORMAL HIGH (ref 70–99)

## 2023-07-28 NOTE — Progress Notes (Signed)
 NT called nurse in bc for the second time pt has pulled off ileostomy bag and thrown it on the floor, pulled off condom cath and also pulled out IV, as nurse was trying to put everything back on pt became combative with nurses and NT. Nurse advised pt that he needed to stop or he would have to be placed in restraints pt continued to pull everything off and yell at staff. Placed pt in restraints and got order from provider.

## 2023-07-28 NOTE — Progress Notes (Signed)
 Daily Progress Note   Patient Name: Gregory [name]       Date: 07/28/2023 DOB: [birth year]  Age: 63 y.o. MRN#: [id number] Attending Physician: Arlon Carliss ORN, DO Primary Care Physician: [name], Provider, MD Admit Date: 07/15/2023  Reason for Consultation/Follow-up: Establishing goals of care  Length of Stay: 12  Current Medications: Scheduled Meds:   acetaminophen   650 mg Oral Q6H   arformoterol   15 mcg Nebulization BID   aspirin  EC  81 mg Oral Daily   atorvastatin   80 mg Oral QHS   budesonide  (PULMICORT ) nebulizer solution  0.5 mg Nebulization BID   carvedilol   12.5 mg Oral BID WC   Chlorhexidine  Gluconate Cloth  6 each Topical Daily   divalproex   500 mg Oral Q8H   escitalopram   20 mg Oral Daily   famotidine   20 mg Oral Daily   feeding supplement  237 mL Oral BID BM   haloperidol  lactate  5 mg Intramuscular Once   heparin   5,000 Units Subcutaneous Q8H   hydrALAZINE   10 mg Oral Q8H   HYDROmorphone   2 mg Oral Q6H   multivitamin with minerals  1 tablet Oral Daily   pantoprazole   40 mg Oral Daily   pregabalin   200 mg Oral BID   QUEtiapine   200 mg Oral QHS   revefenacin   175 mcg Nebulization Daily   sodium bicarbonate   650 mg Oral BID   tamsulosin   0.4 mg Oral Daily   thiamine   100 mg Oral Daily    Continuous Infusions:  cefTRIAXone  (ROCEPHIN )  IV 1 g (07/27/23 1434)    PRN Meds: docusate sodium , haloperidol  lactate, hydrALAZINE , HYDROmorphone  (DILAUDID ) injection, ipratropium-albuterol , mouth rinse, polyethylene glycol  Physical Exam Vitals reviewed.  Constitutional:      General: He is sleeping.  Cardiovascular:     Rate and Rhythm: Bradycardia present.  Pulmonary:     Effort: Pulmonary effort is normal.  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) 07/03/2023   Fever 06/28/2023   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   Altered mental status 02/08/2023   Abscess of abdominal cavity (HCC) 02/08/2023   Protein-calorie malnutrition, severe 12/19/2022   Occipital stroke (HCC) 11/27/2022   Skin ulcer of left ankle, limited to breakdown of skin (HCC) 09/19/2022   Stroke-like symptom 09/18/2022   Diabetic ulcer of lower leg (HCC) 09/18/2022   Constipation 09/18/2022   Shortness of breath 09/07/2022   Symptomatic anemia 09/05/2022   Chronic hypoxic respiratory failure (HCC) 09/05/2022   Respiratory failure (HCC) 08/22/2022   Pressure injury of skin 08/22/2022   Influenza A 08/22/2022   Sepsis (HCC) 08/22/2022   Pneumonia 05/01/2022   Right upper lobe pneumonia 09/05/2021   Hyponatremia 09/05/2021   Anxiety 09/05/2021   BPH (benign prostatic hyperplasia) 09/05/2021   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   Asymptomatic bacteriuria 06/30/2021   Normocytic anemia 06/29/2021   Pain of amputation stump of right lower extremity (HCC) 03/15/2021   Melanoma of cheek (HCC) 10/15/2019   Acute hypoxic respiratory failure (HCC) 07/11/2019   Acute respiratory disease due to COVID-19 virus 07/11/2019   Wound infection 06/22/2019   Abscess 06/22/2019   Chronic kidney disease, stage 3b (HCC) 05/23/2019   Tobacco abuse 05/23/2019   Cellulitis 05/23/2019   S/P AKA (above knee amputation) unilateral, right (HCC) 12/05/2018   Amputation stump infection (HCC)    Lymphedema of left leg     Diabetic polyneuropathy associated with type 2 diabetes mellitus (HCC)    Above knee amputation of right lower extremity (HCC) 10/22/2018   Dehiscence of amputation stump (HCC)    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   Candidiasis 08/12/2018   Necrosis of toe (HCC) 07/08/2018   Amputated toe of left foot (HCC) 06/18/2018   Streptococcal infection    Pressure injury, stage 3 (HCC) 06/06/2018   Cutaneous abscess of left foot    Moderate protein-calorie malnutrition (HCC)    Cellulitis of left lower extremity    Septic arthritis (HCC) 06/03/2018   Idiopathic chronic venous hypertension of left lower extremity with ulcer and inflammation (HCC) 11/14/2017   Great toe amputation status, left 11/14/2017   Enlarged lymph node in neck 07/18/2017   Malignant melanoma of face (HCC) 04/16/2017   Osteomyelitis (HCC) 03/25/2017   Skin cancer 03/25/2017   Depression 11/05/2016   Diabetic ulcer of toe of left foot (HCC) 10/02/2016   Epidural abscess 10/02/2016   Chronic pain 09/27/2016   DM2 (diabetes mellitus, type 2) (HCC) 06/06/2016   GERD without esophagitis 05/11/2016   Hypertensive heart disease with congestive heart failure (HCC) 12/15/2015   Spinal stenosis in cervical region 12/15/2015   Type II diabetes mellitus with neurological manifestations (HCC) 12/15/2015   Chronic diastolic HF (heart failure) (HCC)    Pulmonary hypertension (HCC)    HTN (hypertension)    HLD (hyperlipidemia)    Urinary retention    Diskitis    Foot drop, bilateral    Sepsis with acute renal failure without septic shock (HCC)    COPD (chronic obstructive pulmonary disease) (HCC) 10/03/2015   Acute osteomyelitis of left foot (HCC) 10/03/2015   MRSA bacteremia 08/13/2015   Chronic low back pain 08/13/2015   Chronic multifocal osteomyelitis,  multiple sites [facility]) 08/13/2015   Community acquired pneumonia 08/13/2015   Encephalopathy 03/25/2012    Palliative Care Assessment & Plan   Patient  Profile: 63 y.o. 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 He was admitted to ICU and managed with Precedex .    He was hospitalized 06/27/2023 to 07/11/2023 for septic shock due to ESBL UTI and multifocal pneumonia with rhinovirus infection/acute respiratory failure with hypoxia requiring intubation and extubation/acute encephalopathy/acute ischemic stroke. Palliative care saw during the January 2025 hospitalization.  Today's Discussion: Received update from nursing. Patient remains disoriented. He is sleeping a lot and when not sleeping he screams and calls out. Psychiatry is following for delirium.   Patient is sleeping in NAD. Did not attempt to wake up patient.  09:20: Called patient's daughter [name]. We discuss the patient's ongoing delirium. [name] shares her concern that the patient's delirium might not improve while he remains hospitalized. We discussed Psychiatry's recommendations. Discussed plan to get back to [facility].   Encouraged her to call PMT with questions or concerns.  Recommendations/Plan: Full code Full scope Time for outcomes: Daughter is hopeful the patient will be able to participate in GOC discussions if/when confusion improves PMT will follow  Code Status:    Code Status Orders  (From admission, onward)           Start     Ordered   07/16/23 0444  Full code  Continuous       Question:  By:  Answer:  Consent: discussion documented in EHR   07/16/23 0445           Extensive chart review has been completed prior to seeing the patient including labs, vital signs, imaging, progress/consult notes, orders, medications, and available advance directive documents.  Care plan was discussed with bedside  RN  Time spent: 25 minutes  Thank you for allowing the Palliative Medicine Team to assist in the care of this patient.    Stephane CHRISTELLA Palin, NP  Please contact Palliative Medicine Team phone at [phone] for questions and concerns.

## 2023-07-28 NOTE — Progress Notes (Addendum)
 Progress Note   Patient: Gregory [name] FMW:[id number] DOB: [birth year] DOA: 07/15/2023     12 DOS: the patient was seen and examined on 07/28/2023   Brief hospital course: 63 year old male with history of diabetes mellitus type 2, hypertension, hyperlipidemia, chronic kidney disease stage IIIb, COPD, chronic osteomyelitis status post right AKA, left midfoot amputation, unspecified stroke, A-fib, chronic diastolic heart failure, mesenteric ischemia status post colostomy and recent hospitalization from 06/27/2023 to 07/11/2023 for septic shock due to ESBL UTI and multifocal pneumonia with rhinovirus infection/acute respiratory failure with hypoxia requiring intubation and extubation/acute encephalopathy/acute ischemic stroke and subsequent discharge to SNF presented with severe agitation and delirium.  On presentation, CT of the head was negative for acute abnormalities.  Ammonia and VBG were unremarkable.  He was admitted to ICU and managed with Precedex .  He was transferred to TRH service from 07/23/2023 onwards.   Assessment and Plan:  Acute metabolic encephalopathy/agitation and delirium -Initially admitted to ICU under PCCM service and treated with Precedex .  Ammonia, VBG and CT of the head were negative for acute intracranial abnormality. Care transferred to TRH service from 07/23/2023 onwards.  At appear to resolved at this time. -Psychiatry following closely.  Working to reduce sedative type medications which may be contributing to patient's delirium. -Initially show improvement however became more lethargic 2/7.  Underlying etiology include dehydration, medication effect, urinary retention, or even insidious infection.  Will monitor her encephalopathy and blood pressure closely.  Treatment as below.  Will attempt to hold any sedative type medications including opioids/benzos.  Hypotension - On 2/7, systolic blood pressures in the 80s.  Patient appeared more lethargic.  Unclear if from hypovolemia or  other etiology such as acute urinary retention.  IV fluid bolus given with excellent response, systolic blood pressures in the 120-130s now.  Monitor urine output.  Possible Urinary Tract Infection -UA appears cloudy, large amount of leukocytes and nitrate positive with noted bacteria.  Currently on day 3 of 5 empiric ceftriaxone .  Could just be bacteriuria and colonization from dehydration.  UC showing NGTD.  Will treat empirically given hypotension and lethargy.  Patient afebrile.  Concern for acute urinary retention - Etiology may be from UTI or vice versa.  Despite IV fluid bolus, creatinine creeping upward.  I/O x 1 resulted in ~31mL urine output.  Flomax  initiated.  Urine output since seems adequate.  COPD -Stable.  Continue current nebulized regimen   Chronic systolic heart failure Hypertension Hyperlipidemia -Not appear to be in acute exacerbation.  Continue strict input and output.  Daily weights.  Fluid restriction.  Continue Coreg , hydralazine  and statin.  Outpatient follow-up with cardiology   Diabetes mellitus type 2 -Blood sugars controlled   Acute kidney injury CKD stage IIIb -Slightly worsened since yesterday.  Likely prerenal etiology.  Ordered NS as above.  Monitor urine output.  Will recheck BMP in a.m.   Hypomagnesemia -Pending recheck this morning.  [place] improved after IV replenishment.  Will recheck magnesium  in a.m.   Chronic pain syndrome -Continue current regimen   Depression/anxiety -Continue current regimen.  Psychiatry consulted.  Follow recommendations   Anemia of chronic disease -Possibly from chronic kidney disease.  Hemoglobin stable.  Monitor intermittently   Goals of care -Patient evaluated by palliative care.  Patient continues to be full code.  Currently unable to discuss goals of care.  Closely with case management and family on disposition planning.  Likely eventual transition back to [facility].   Generalized deconditioning -PT  recommending SNF placement.  Consult TOC      Subjective: Patient continues to be less lethargic this morning.  Patient is alert but appears tired.  More alert than yesterday.  Denies any pain, shortness of breath, nausea, vomiting.  Afebrile overnight.  Systolic blood pressure appears improved.  Output adequate.  Patient states he feels malaise but unable to specify any specific symptom.  Physical Exam: Vitals:   07/27/23 1618 07/27/23 1941 07/28/23 0500 07/28/23 0517  BP: (!) 105/53 (!) 120/94  127/88  Pulse: 62 69  (!) 59  Resp: 18 18  18   Temp:  98.1 F (36.7 C)  97.9 F (36.6 C)  TempSrc:  Oral  Oral  SpO2: 100% 100%    Weight:   81.7 kg   Height:       GENERAL:  Alert, lethargic, disheveled HEENT:  EOMI CARDIOVASCULAR: Regular and bradycardic, no murmurs appreciated RESPIRATORY:  Clear to auscultation, no wheezing, rales, or rhonchi GASTROINTESTINAL:  Soft, nontender, nondistended EXTREMITIES: Right AKA NEURO:  No new focal deficits appreciated, lethargic but rousable SKIN:  No rashes noted PSYCH:  Appropriate mood and affect   Data Reviewed:  There are no new results to review at this time.  Family Communication: None at bedside  Disposition: Status is: Inpatient Remains inpatient appropriate because: Continued IV medications and medication adjustment  Planned Discharge Destination: Skilled nursing facility    Time spent: 32 minutes  Author: Carliss LELON Canales, DO 07/28/2023 11:10 AM  For on call review [email or web address].

## 2023-07-28 NOTE — Plan of Care (Signed)

## 2023-07-28 NOTE — Consult Note (Signed)
 [facility] Psychiatric Consult Follow-up  Patient Name: .Gregory [name]  MRN: [id number]  DOB: [birth year]  Consult Order details:  Orders (From admission, onward)     Start     Ordered   07/23/23 1704  IP CONSULT TO PSYCHIATRY       Ordering Provider: Rosario Leatrice FERNS, MD  Provider:  (Not yet assigned)  Question Answer Comment  Location [facility]   Reason for Consult? Severe agitation      07/23/23 1703            Mode of Visit: In person   Psychiatry Consult Evaluation  Service Date: July 28, 2023 LOS:  LOS: 12 days  Chief Complaint I'm in pain everywhere  Primary Psychiatric Diagnoses  Hyperactive delirium  2.  Somatic symptom disorder with predominant pain 3. History of MDD   Assessment  Gregory [name] is a 63 y.o. male admitted: Medicallyfor 07/15/2023  3:05 PM for acute agitated encephalopathy. 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   His current presentation of inattention, waxing and waning mental status, change over the last couple of days is most consistent with hyperactive delirium likely in the setting of multiple medications and uncontrolled pain. There is suspicion for somatic symptom disorder with predominant pain given chart review notable for multiple outpatient visits to chronic pain physicians and continued use of oxy throughout the years though notably nothing on PDMP. Main psychiatric diagnosis over the years has been depression/anxiety and patient and collateral denies any past psychiatric hospitalizations or suicide attempts. He reports depression but is unable to state what medications he has been on and whether or not they have helped. Current outpatient psychotropic medications from last admission included scheduled  lexapro  20, oxy 15 TID, lyrica  25 TID, 100 BID, seroquel  100 at bedtime, PRN haldol  5 Q6H, oxy 5 Q6H and historically he was fully alert and mostly oriented on these medications. He was likely compliant with medications prior to admission since he was at the nursing home. On initial examination, patient is alert, irritable, is not able to participate in sustained attention testing and is reporting pain. Will recommend decreasing seroquel , stopping klonopin , continuing depakote , making sure his pain is well controlled. I suspect that he has a low frustration tolerance with regards to his pain which contributes to agitation. On follow-up examination, patient was somnolent and only briefly aroused to voice. Per nursing, patient received ativan  and dilaudid  on and also noted to have soft BP and leukocytosis. Wonder if medications or infection could be contributing to delirium as well.   On reassessment this morning, patient was alert and oriented to person and place this morning. He was more alert and more engaging than the previous days. He responded fairly well to a few questions but was not able to maintain attention for further testing as he quickly dosed off. He appeared comfortable in bed and in no apparent acute safety risk at the time. No behavioral issues reported overnight.  We will maintain patient on the decreased dosage of Seroquel  200 mg at bed time as this may have helped in maintaining a more alert mental state this morning. Please see plan below for detailed recommendations.     Diagnoses:  Active Hospital problems: Principal Problem:   Acute encephalopathy Active  Problems:   Malnutrition of moderate degree    Plan   ## Psychiatric Medication Recommendations:  --continue seroquel  to 200 QHS for mood stabilization and insomnia given continued somnolence and concern for polypharmacy, consider decrease if continued somnolence without agitation --continue lexapro  20 daily for depression   --stop klonopin  1 BID for agitation  --continue depakote  500 Q8H for mood stabilization, can obtain depakote  level in a couple days if patient continues on medication and is still in the hospital --optimize pain regimen   ## Medical Decision Making Capacity: Not specifically addressed in this encounter  ## Further Work-up:  --B12, folate  --f/u ammonia level -- most recent EKG on 2/3 had QtC of 413 -- Pertinent labwork reviewed earlier this admission includes: CMP (Cr 1.52), CBC, TSH, UDS  ## Disposition:-- There are no psychiatric contraindications to discharge at this time  ## Behavioral / Environmental: -Delirium Precautions: Delirium Interventions for Nursing and Staff: - RN to open blinds every AM. - To Bedside: Glasses, hearing aide, and pt's own shoes. Make available to patients. when possible and encourage use. - Encourage po fluids when appropriate, keep fluids within reach. - OOB to chair with meals. - Passive ROM exercises to all extremities with AM & PM care. - RN to assess orientation to person, time and place QAM and PRN. - Recommend extended visitation hours with familiar family/friends as feasible. - Staff to minimize disturbances at night. Turn off television when pt asleep or when not in use.   ## Safety and Observation Level:  - Based on my clinical evaluation, I estimate the patient to be at moderate risk of self harm in the current setting due to agitation. - At this time, we recommend  safety observation. This decision is based on my review of the chart including patient's history and current presentation, interview of the patient, mental status examination, and consideration of suicide risk including evaluating suicidal ideation, plan, intent, suicidal or self-harm behaviors, risk factors, and protective factors. This judgment is based on our ability to directly address suicide risk, implement suicide prevention strategies, and develop a safety plan while the patient is in  the clinical setting. Please contact our team if there is a concern that risk level has changed.  CSSR Risk Category:C-SSRS RISK CATEGORY: No Risk  Suicide Risk Assessment: Patient has following modifiable risk factors for suicide: recklessness, which we are addressing by optimizing his medications Patient has following non-modifiable or demographic risk factors for suicide: male gender Patient has the following protective factors against suicide: Supportive family, no history of suicide attempts, and no history of NSSIB  Thank you for this consult request. Recommendations have been communicated to the primary team.  We will continue to follow at this time.   LAWERENCE GUT, NP       History of Present Illness  Relevant Aspects of [facility] Course:  Admitted on 07/15/2023 for acute agitated encephalopathy.  -Required precedex   -Acute encephalopathy thought to be sepsis related ?AKI vs CKD   ON events:  Received ativan  overnight, unclear why. Noted to have leukocytosis, soft BP this AM.   Patient Report:  Patient  found asleep but quickly responded to name when called this morning. He responded with good when asked about how he was feeling. He requested for breakfast and stated that he can feed himself. Patient was then concentrated on my gloves, asking if I can give them to him. He was unable to concentrate further on assessment at the time.   Psych  ROS:  Depression: reports always had depression Anxiety: reports anxiety  Mania (lifetime and current): denies  Psychosis: (lifetime and current): none  Collateral information:  Contacted daughter [name] at [phone] on 2/5 She reports when he was discharged from last hospitalization, he was confused but pleasant and calm. Was with him when he was admitted to hospital. States he is not himself. States his agitation beforehand is if they were taking a long time to get his medication. He was more confused and didn't understand  situation. Called her this morning and was with him last night, called her at 9pm. He was asking if she was coming to pick him up. He seemed confused about where he was last night. He seems a little more confused. Prior to admission, he was falling on the floor, trying to pee everywhere. Was not realizing where he was. On Monday nursing home told her he kept on falling in the floor. He didn't call her as he usually does. Still occasionally staring off when she was with him in the hospital. Trying to feed himself but weak. Held his pain medication. She reports she felt like he was a zombie yesterday. Sometimes need xanax  when anxious. Yesterday he was telling her to clean up ice cream on floor. She thought perhaps he was bipolar. He can flip a switch really quickly, gets agitated easily. If he doesn't get what he wants he gets really mad, has always been like that. He's been a functional adult until his health declined. Struggles with anxiety and depression from his health decline. Reports only psychotropic she is aware that he used to take was lexapro  before all of his medical conditions.   ROS  Unable to obtain due to patient mental status  Psychiatric and Social History  Psychiatric History:  Information collected from patient, chart review  Prev Dx/Sx: MDD Current Psych Provider: none Home Meds (current): scheduled lexapro  20, oxy 15 TID, lyrica  25 TID, 100 BID, seroquel  100 at bedtime, PRN haldol  5 Q6H, oxy 5 Q6H Previous Med Trials: wellbutrin , cymbalta , gabapentin  Therapy: unknown  Prior Psych Hospitalization: denies  Prior Self Harm: denies Prior Violence: denies  Family Psych History: denies, reports sister struggles with substance use  Family Hx suicide: unknown  Social History:  Living Situation: Lives at [facility] for several years, no access to anything else  Access to weapons/lethal means: none   Substance History Denies substance use history, UDS+opiates, BZD. Patient  required ativan , versed .  -In the past he used marijuana frequently, but then became dependent on pain meds   Exam Findings  Vital Signs:  Temp:  [97.9 F (36.6 C)-98.1 F (36.7 C)] 97.9 F (36.6 C) (02/09 0517) Pulse Rate:  [59-71] 59 (02/09 0517) Resp:  [16-18] 18 (02/09 0517) BP: (96-127)/(53-94) 127/88 (02/09 0517) SpO2:  [93 %-100 %] 100 % (02/08 1941) Weight:  [81.7 kg] 81.7 kg (02/09 0500) Blood pressure 127/88, pulse (!) 59, temperature 97.9 F (36.6 C), temperature source Oral, resp. rate 18, height 6' (1.829 m), weight 81.7 kg, SpO2 100%. Body mass index is 24.43 kg/m.  Physical Exam Constitutional:      Appearance: He is obese.  HENT:     Head: Normocephalic.  Pulmonary:     Effort: Pulmonary effort is normal.  Neurological:     Mental Status: He is alert.    Mental Status Exam: unable to obtain due to somnolence General Appearance: Casual, naked under a blanket  Orientation:  Other:  to person and place  Memory:  unable to obtain  Concentration:  Concentration: Poor  Recall:  Poor  Attention  Poor  Eye Contact:  Poor  Speech:  soft, clear  Language:  Fair  Volume:  Normal  Mood: I'm ready to eat  Affect:   labile, congruent   Thought Process:  unable to obtain   Thought Content:   unable to obtain  Suicidal Thoughts:  unable to obtain  Homicidal Thoughts:  unable to obtain  Judgement:  unable to obtain  Insight:  unable to obtain  Psychomotor Activity:  Decreased  Akathisia:  No  Fund of Knowledge:  Unable to obtain      Assets: some ability to communicate needs  Cognition:  Impaired, mild  ADL's:  Impaired  AIMS (if indicated):        Other History   These have been pulled in through the EMR, reviewed, and updated if appropriate.  Family History:  The patient's family history includes Bone cancer in his father; Cancer in his father, mother, paternal grandfather, and another family member; Diabetes in his mother and another family member;  Heart disease in his mother; Prostate cancer in his father.  Medical History: 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    Chronic pain syndrome    COPD (chronic obstructive pulmonary disease) (HCC)    DDD (degenerative disc disease), lumbar    Depression    major depressive disorder    Diabetes mellitus without complication (HCC)    type 2    Diabetic ulcer of toe of left foot (HCC) 10/02/2016   Difficult intubation    Difficulty in walking, not elsewhere classified    Epidural abscess 10/02/2016   Foot drop, bilateral    Foot drop, right 12/27/2015   GERD (gastroesophageal reflux disease)    Herpesviral vesicular dermatitis    History of COVID-19 03/15/2022   Hyperlipidemia    Hyperlipidemia    Hypertension    Insomnia    Malignant melanoma of other parts of face (HCC)    Melanoma (HCC)    MRSA bacteremia    Muscle weakness (generalized)    Nasal congestion    Necrosis of toe (HCC) 07/08/2018   Neuromuscular dysfunction of bladder    Osteomyelitis (HCC)    Osteomyelitis (HCC)    Osteomyelitis (HCC)    right BKA   Other idiopathic peripheral autonomic neuropathy    Retention of urine, unspecified    Squamous cell carcinoma of skin    Stroke (HCC)    Unsteadiness on feet    Urinary retention    Urinary retention    Urinary tract infection    Wears dentures    Wears glasses     Surgical History: Past Surgical History:  Procedure Laterality Date   AMPUTATION Left  03/29/2017   Procedure: LEFT GREAT TOE AMPUTATION AT METATARSOPHALANGEAL JOINT;  Surgeon: Harden Jerona GAILS, MD;  Location: [facility]  OR;  Service: Orthopedics;  Laterality: Left;   AMPUTATION Right 03/29/2017   Procedure: RIGHT BELOW KNEE AMPUTATION;  Surgeon: Harden Jerona GAILS, MD;  Location: [facility] OR;  Service: Orthopedics;  Laterality: Right;   AMPUTATION Left 06/06/2018   Procedure: LEFT 2ND TOE AMPUTATION;  Surgeon: Harden Jerona GAILS, MD;  Location: [facility] OR;  Service: Orthopedics;  Laterality: Left;   AMPUTATION Right 11/19/2018   Procedure: AMPUTATION ABOVE KNEE;  Surgeon: Harden Jerona GAILS, MD;  Location: [facility] OR;  Service: Orthopedics;  Laterality: Right;   ANTERIOR CERVICAL CORPECTOMY N/A 11/25/2015   Procedure: ANTERIOR CERVICAL FIVE CORPECTOMY Cervical four - six fusion;  Surgeon: Gerldine Maizes, MD;  Location: [facility];  Service: Neurosurgery;  Laterality: N/A;  ANTERIOR CERVICAL FIVE CORPECTOMY Cervical four - six fusion   APPLICATION OF WOUND VAC Right 10/22/2018   Procedure: Application Of Wound Vac;  Surgeon: Harden Jerona GAILS, MD;  Location: [facility] OR;  Service: Orthopedics;  Laterality: Right;   CYSTOSCOPY WITH BIOPSY N/A 05/01/2022   Procedure: CYSTOSCOPY WITH URETHRAL BIOPSY;  Surgeon: Elisabeth Valli BIRCH, MD;  Location: [facility] ORS;  Service: Urology;  Laterality: N/A;  1 HR   CYSTOSCOPY WITH RETROGRADE URETHROGRAM N/A 04/25/2018   Procedure: CYSTOSCOPY WITH RETROGRADE URETHROGRAM/ BALLOON DILATION;  Surgeon: Ottelin, Mark, MD;  Location: [facility]S;  Service: Urology;  Laterality: N/A;   CYSTOSCOPY WITH URETHRAL DILATATION N/A 05/01/2022   Procedure: BALLOON DILATION WITH  OPTILUME;  Surgeon: Elisabeth Valli BIRCH, MD;  Location: [facility] ORS;  Service: Urology;  Laterality: N/A;   LAPAROTOMY N/A 12/03/2022   Procedure: EXPLORATORY LAPAROTOMY  total abdominal colectomy with end ileostomy;  Surgeon: Kinsinger, Herlene Righter, MD;  Location: [facility] OR;  Service: General;  Laterality: N/A;   MULTIPLE TOOTH EXTRACTIONS     POSTERIOR  CERVICAL FUSION/FORAMINOTOMY N/A 11/29/2015   Procedure: Cervical Three-Cervical Seven Posterior Cervical Laminectomy with Fusion;  Surgeon: Gerldine Maizes, MD;  Location: [facility];  Service: Neurosurgery;  Laterality: N/A;  Cervical Three-Cervical Seven Posterior Cervical Laminectomy with Fusion   STUMP REVISION Right 10/22/2018   Procedure: REVISION RIGHT BELOW KNEE AMPUTATION;  Surgeon: Harden Jerona GAILS, MD;  Location: [facility] OR;  Service: Orthopedics;  Laterality: Right;   STUMP REVISION Right 12/05/2018   Procedure: Revision Right Above Knee Amputation;  Surgeon: Harden Jerona GAILS, MD;  Location: [facility] OR;  Service: Orthopedics;  Laterality: Right;   STUMP REVISION Right 05/29/2019   Procedure: REVISION RIGHT ABOVE KNEE AMPUTATION;  Surgeon: Harden Jerona GAILS, MD;  Location: [facility] OR;  Service: Orthopedics;  Laterality: Right;   STUMP REVISION Right 06/24/2019   Procedure: STUMP REVISION;  Surgeon: Harden Jerona GAILS, MD;  Location: [facility] OR;  Service: Orthopedics;  Laterality: Right;     Medications:   Current Facility-Administered Medications:    acetaminophen  (TYLENOL ) tablet 650 mg, 650 mg, Oral, Q6H, Claudene Toribio BROCKS, MD, 650 mg at 07/28/23 0539   arformoterol  (BROVANA ) nebulizer solution 15 mcg, 15 mcg, Nebulization, BID, Agarwala, Ravi, MD, 15 mcg at 07/27/23 0859   aspirin  EC tablet 81 mg, 81 mg, Oral, Daily, Hunsucker, Donnice SAUNDERS, MD, 81 mg at 07/28/23 1022   atorvastatin  (LIPITOR ) tablet 80 mg, 80 mg, Oral, QHS, Autry, Lauren E, [name], 80 mg at 07/27/23 2131   budesonide  (PULMICORT ) nebulizer solution 0.5 mg, 0.5 mg, Nebulization, BID, Payne, John D, [name], 0.5 mg at 07/27/23 9140   carvedilol  (COREG ) tablet 12.5 mg, 12.5 mg, Oral, BID WC, Autry, Lauren E, [name], 12.5 mg at 07/28/23 1022   cefTRIAXone  (  ROCEPHIN ) 1 g in sodium chloride  0.9 % 100 mL IVPB, 1 g, Intravenous, Q24H, Calkins, Derek W, DO, Last Rate: 200 mL/hr at 07/27/23 1434, 1 g at 07/27/23 1434   Chlorhexidine  Gluconate Cloth 2 % PADS 6 each, 6  each, Topical, Daily, Claudene Toribio BROCKS, MD, 6 each at 07/27/23 1226   divalproex  (DEPAKOTE ) DR tablet 500 mg, 500 mg, Oral, Q8H, Gretta Leita SQUIBB, DO, 500 mg at 07/28/23 9460   docusate sodium  (COLACE) capsule 100 mg, 100 mg, Oral, BID PRN, Payne, John D, [name]   escitalopram  (LEXAPRO ) tablet 20 mg, 20 mg, Oral, Daily, Autry, Lauren E, [name], 20 mg at 07/28/23 1022   famotidine  (PEPCID ) tablet 20 mg, 20 mg, Oral, Daily, Emilio, John D, [name], 20 mg at 07/28/23 1022   feeding supplement (ENSURE ENLIVE / ENSURE PLUS) liquid 237 mL, 237 mL, Oral, BID BM, Hunsucker, Donnice SAUNDERS, MD, 237 mL at 07/27/23 0944   haloperidol  lactate (HALDOL ) injection 5 mg, 5 mg, Intravenous, Q6H PRN, Hunsucker, Donnice SAUNDERS, MD, 5 mg at 07/26/23 1226   haloperidol  lactate (HALDOL ) injection 5 mg, 5 mg, Intramuscular, Once, Paliwal, Aditya, MD   heparin  injection 5,000 Units, 5,000 Units, Subcutaneous, Q8H, Payne, John D, [name], 5,000 Units at 07/28/23 9462   hydrALAZINE  (APRESOLINE ) injection 10-40 mg, 10-40 mg, Intravenous, Q4H PRN, Emilio, John D, [name]   hydrALAZINE  (APRESOLINE ) tablet 10 mg, 10 mg, Oral, Q8H, Autry, Lauren E, [name], 10 mg at 07/25/23 2126   HYDROmorphone  (DILAUDID ) injection 1 mg, 1 mg, Intravenous, Q4H PRN, Claudene Toribio BROCKS, MD, 1 mg at 07/27/23 2133   HYDROmorphone  (DILAUDID ) tablet 2 mg, 2 mg, Oral, Q6H, Claudene Toribio BROCKS, MD, 2 mg at 07/28/23 9460   ipratropium-albuterol  (DUONEB) 0.5-2.5 (3) MG/3ML nebulizer solution 3 mL, 3 mL, Nebulization, Q4H PRN, Emilio Norleen BIRCH, [name]   multivitamin with minerals tablet 1 tablet, 1 tablet, Oral, Daily, Arlon, Derek W, DO, 1 tablet at 07/28/23 1022   Oral care mouth rinse, 15 mL, Mouth Rinse, PRN, Ramaswamy, Murali, MD   pantoprazole  (PROTONIX ) EC tablet 40 mg, 40 mg, Oral, Daily, Autry, Lauren E, [name], 40 mg at 07/28/23 1021   polyethylene glycol (MIRALAX  / GLYCOLAX ) packet 17 g, 17 g, Oral, Daily PRN, Emilio, John D, [name]   pregabalin  (LYRICA ) capsule 200 mg, 200 mg, Oral, BID,  Gretta Leita SQUIBB, DO, 200 mg at 07/28/23 1022   QUEtiapine  (SEROQUEL ) tablet 200 mg, 200 mg, Oral, QHS, Kenzi Bardwell, NP, 200 mg at 07/27/23 2131   revefenacin  (YUPELRI ) nebulizer solution 175 mcg, 175 mcg, Nebulization, Daily, Payne, John D, [name], 175 mcg at 07/27/23 9140   sodium bicarbonate  tablet 650 mg, 650 mg, Oral, BID, Hunsucker, Donnice SAUNDERS, MD, 650 mg at 07/28/23 1022   tamsulosin  (FLOMAX ) capsule 0.4 mg, 0.4 mg, Oral, Daily, Calkins, Derek W, DO, 0.4 mg at 07/28/23 1022   [COMPLETED] thiamine  (VITAMIN B1) 500 mg in sodium chloride  0.9 % 50 mL IVPB, 500 mg, Intravenous, Q8H, Last Rate: 110 mL/hr at 07/21/23 0600, Infusion Verify at 07/21/23 0600 **FOLLOWED BY** thiamine  (VITAMIN B1) tablet 100 mg, 100 mg, Oral, Daily, Claudene Toribio BROCKS, MD, 100 mg at 07/28/23 1022  Allergies: Allergies  Allergen Reactions   Latex Other (See Comments)    Unknown reaction    Basya Casavant, NP

## 2023-07-29 ENCOUNTER — Encounter (HOSPITAL_COMMUNITY): Payer: Self-pay | Admitting: Internal Medicine

## 2023-07-29 DIAGNOSIS — G9340 Encephalopathy, unspecified: Secondary | ICD-10-CM | POA: Diagnosis not present

## 2023-07-29 LAB — COMPREHENSIVE METABOLIC PANEL
ALT: 13 U/L (ref 0–44)
AST: 13 U/L — ABNORMAL LOW (ref 15–41)
Albumin: 2.5 g/dL — ABNORMAL LOW (ref 3.5–5.0)
Alkaline Phosphatase: 75 U/L (ref 38–126)
Anion gap: 10 (ref 5–15)
BUN: 26 mg/dL — ABNORMAL HIGH (ref 8–23)
CO2: 22 mmol/L (ref 22–32)
Calcium: 8.7 mg/dL — ABNORMAL LOW (ref 8.9–10.3)
Chloride: 103 mmol/L (ref 98–111)
Creatinine, Ser: 2.03 mg/dL — ABNORMAL HIGH (ref 0.61–1.24)
GFR, Estimated: 36 mL/min — ABNORMAL LOW (ref >60)
Glucose, Bld: 77 mg/dL (ref 70–99)
Potassium: 4.3 mmol/L (ref 3.5–5.1)
Sodium: 135 mmol/L (ref 135–145)
Total Bilirubin: 0.9 mg/dL (ref 0.0–1.2)
Total Protein: 6.5 g/dL (ref 6.5–8.1)

## 2023-07-29 LAB — CBC
HCT: 30.7 % — ABNORMAL LOW (ref 39.0–52.0)
Hemoglobin: 9.8 g/dL — ABNORMAL LOW (ref 13.0–17.0)
MCH: 29.1 pg (ref 26.0–34.0)
MCHC: 31.9 g/dL (ref 30.0–36.0)
MCV: 91.1 fL (ref 80.0–100.0)
Platelets: 119 10*3/uL — ABNORMAL LOW (ref 150–400)
RBC: 3.37 MIL/uL — ABNORMAL LOW (ref 4.22–5.81)
RDW: 15.8 % — ABNORMAL HIGH (ref 11.5–15.5)
WBC: 6.1 10*3/uL (ref 4.0–10.5)
nRBC: 0 % (ref 0.0–0.2)

## 2023-07-29 LAB — MAGNESIUM: Magnesium: 1.9 mg/dL (ref 1.7–2.4)

## 2023-07-29 MED ORDER — LORAZEPAM 2 MG/ML IJ SOLN
1.0000 mg | Freq: Once | INTRAMUSCULAR | Status: AC | PRN
  Administered 2023-07-30: 1 mg via INTRAVENOUS
  Filled 2023-07-29: qty 1

## 2023-07-29 MED ORDER — LORAZEPAM 1 MG PO TABS: 1.0000 mg | ORAL_TABLET | Freq: Once | ORAL | Status: AC | PRN

## 2023-07-29 NOTE — Progress Notes (Addendum)
 Progress Note   Patient: Gregory [name] XBM:[id number] DOB: 11/15/1960 DOA: 07/15/2023     13 DOS: the patient was seen and examined on 07/29/2023   Brief hospital course: 63 year old male with history of diabetes mellitus type 2, hypertension, hyperlipidemia, chronic kidney disease stage IIIb, COPD, chronic osteomyelitis status post right AKA, left midfoot amputation, unspecified stroke, A-fib, chronic diastolic heart failure, mesenteric ischemia status post colostomy and recent hospitalization from 06/27/2023 to 07/11/2023 for septic shock due to ESBL UTI and multifocal pneumonia with rhinovirus infection/acute respiratory failure with hypoxia requiring intubation and extubation/acute encephalopathy/acute ischemic stroke and subsequent discharge to SNF presented with severe agitation and delirium.  On presentation, CT of the head was negative for acute abnormalities.  Ammonia and VBG were unremarkable.  He was admitted to ICU and managed with Precedex .  He was transferred to TRH service from 07/23/2023 onwards.   Assessment and Plan:  Acute metabolic encephalopathy/agitation and delirium -Initially admitted to ICU under PCCM service and treated with Precedex .  Ammonia, VBG and CT of the head were negative for acute intracranial abnormality. Care transferred to TRH service from 07/23/2023 onwards.  At appear to resolved at this time. -Psychiatry following closely.  Working to reduce sedative type medications which may be contributing to patient's delirium. -became more lethargic 2/7.  Underlying etiology include dehydration, medication effect, urinary retention, or even insidious infection.  Yesterday was more alert but appeared more delirious, pulling at lines and removing his colostomy bag.  Was placed in restraints for short time while IV and colostomy bag were replaced.  Showing marked improvement this morning, more alert, talkative.  Treatment as below.  Will continue to hold any sedative type medications  including opioids/benzos.  Hypotension - On 2/7, systolic blood pressures in the 80s.  Patient appeared more lethargic.  Unclear if from hypovolemia or other etiology such as acute urinary retention.  IV fluid bolus given with excellent response, systolic blood pressures in the 120-130s now.  Appears resolved.  Possible Urinary Tract Infection -UA appears cloudy, large amount of leukocytes and nitrate positive with noted bacteria.  Currently on day 4 of 5 empiric ceftriaxone .  Leukocytosis resolved.  Could just be bacteriuria and colonization from dehydration.  UC showing NGTD.  Will treat empirically given hypotension and lethargy.  Patient afebrile.  Concern for acute urinary retention - Etiology may be from UTI or vice versa.  Despite IV fluid bolus, creatinine creeping upward.  I/O x 1 resulted in ~355mL urine output.  Flomax  initiated.  Urine output since seems adequate.  Leukocytosis resolved.  COPD -Stable.  Continue current nebulized regimen   Chronic systolic heart failure Hypertension Hyperlipidemia -Not appear to be in acute exacerbation.  Continue strict input and output.  Daily weights.  Fluid restriction.  Continue Coreg , hydralazine  and statin.  Tolerated Lasix  x 1.  Outpatient follow-up with cardiology   Diabetes mellitus type 2 -Blood sugars controlled   Acute kidney injury CKD stage IIIb -Showing improvement since yesterday.  Likely prerenal etiology.  Ordered NS as above.  Monitor urine output.  Will recheck BMP in a.m.   Hypomagnesemia -Showing improvement since replenishment.  Will recheck magnesium  in a.m.   Chronic pain syndrome -Continue current regimen.   Depression/anxiety -Continue current regimen.  Psychiatry consulted.  Follow recommendations   Anemia of chronic disease -Possibly from chronic kidney disease.  Hemoglobin stable.  Monitor intermittently   Goals of care -Patient evaluated by palliative care.  Patient continues to be full code.   Currently unable  to discuss goals of care.  Closely with case management and family on disposition planning.  Likely eventual transition back to [facility].   Generalized deconditioning -PT recommending SNF placement.  Consult TOC  Goals of care - Tomorrow will complete empiric ceftriaxone .  Encephalopathy appears to be resolving.  Will begin to transition to SNF placement assuming delirium/encephalopathy resolves.      Subjective: Patient much more alert this morning.  Talkative, conversant.  Overnight required restraints secondary to pulling off his IVs and colostomy bag.  Seemed delirious and then, now oriented.  Denies any pain, shortness of breath, nausea, vomiting.  Afebrile overnight.  Systolic blood pressure appears improved.  Output adequate.    Physical Exam: Vitals:   07/29/23 0135 07/29/23 0520 07/29/23 0906 07/29/23 0946  BP:  118/61 108/61   Pulse:  (!) 106 (!) 58 60  Resp:  20  19  Temp:  (!) 97.4 F (36.3 C) 97.9 F (36.6 C)   TempSrc:   Oral   SpO2:  90% 96% 97%  Weight: 79.9 kg     Height:       GENERAL:  Alert, in no acute distress, disheveled HEENT:  EOMI CARDIOVASCULAR: Regular and bradycardic, no murmurs appreciated RESPIRATORY:  Clear to auscultation, no wheezing, rales, or rhonchi GASTROINTESTINAL:  Soft, nontender, nondistended, colostomy in place EXTREMITIES: Right AKA NEURO:  No new focal deficits appreciated, lethargic but rousable SKIN:  No rashes noted PSYCH:  Appropriate mood and affect   Data Reviewed:  There are no new results to review at this time.  Family Communication: None at bedside  Disposition: Status is: Inpatient Remains inpatient appropriate because: Continued IV medications and medication adjustment  Planned Discharge Destination: Skilled nursing facility    Time spent: 34 minutes  Author: Jodeane Mulligan, DO 07/29/2023 10:55 AM  For on call review [email or web address].

## 2023-07-29 NOTE — Progress Notes (Signed)
 Physical Therapy Treatment Patient Details Name: Gregory [name] MRN: [id number] DOB: Feb 17, 1961 Today's Date: 07/29/2023   History of Present Illness 63 yo male from [place] admitted 1/27 with AMS; Acute encephalopathy/agitation delirium. recently admitted on 1/9-1/23 with septic shock and AMS secondary to ESBL UTI and Serratia/Klebsiella multifocal pneumonia/positive rhinovirus.PMH DM2 HTN HLD CKDII COPD s/p R AKA L midfoot amputation CVA Afib mesenteric ischemia s/p colostomy 12/2022 presents to [facility] ED on 1/27 with agitation delirium.    PT Comments  More alert and interactive today. Focused on increasing independence with bed mobility and lateral scoots along bed. Able to spend prolonged period of session seated EOB working on weight shift and midline control. Required up to mod assist intermittently. Assisted with hygiene as hands/legs appeared covered in stool from colostomy. New colostomy was in place and appears clean. Patient will continue to benefit from skilled physical therapy services to further improve independence with functional mobility.     If plan is discharge home, recommend the following: Two people to help with walking and/or transfers;Assistance with cooking/housework;Direct supervision/assist for medications management;Direct supervision/assist for financial management;Assist for transportation;Supervision due to cognitive status;Two people to help with bathing/dressing/bathroom   Can travel by private vehicle     No  Equipment Recommendations  None recommended by PT    Recommendations for Other Services       Precautions / Restrictions Precautions Precautions: Fall;Other (comment) (contact) Precaution Comments: Colostomy on Rt Required Braces or Orthoses:  (no prosthesis here) Restrictions Weight Bearing Restrictions Per Provider Order: No Other Position/Activity Restrictions: No weight bearing restricitions per orders or prior PT notes     Mobility  Bed  Mobility Overal bed mobility: Needs Assistance Bed Mobility: Rolling, Sidelying to Sit, Sit to Sidelying Rolling: Mod assist Sidelying to sit: Mod assist, HOB elevated     Sit to sidelying: Mod assist, HOB elevated General bed mobility comments: Mod assist to roll and rise, difficulty sequencing. Able to hold therapist's hand to assist. Mod assist for trunk and LE support back into bed.    Transfers Overall transfer level: Needs assistance Equipment used: None              Lateral/Scoot Transfers: Max assist General transfer comment: Able to push through LLE and UEs to scoot towards Lt. Needs cues for sequencing and to remain focused on task at hand. Max assist eventually required to scoot towards [facility] to reposition. Standing not attempted due to heavy spontaneous lean backwards on multiple occasions..    Ambulation/Gait                   Stairs             Wheelchair Mobility     Tilt Bed    Modified Rankin (Stroke Patients Only)       Balance Overall balance assessment: Needs assistance Sitting-balance support: No upper extremity supported, Feet supported Sitting balance-Leahy Scale: Poor Sitting balance - Comments: Up to mod assist intermittently while sitting EOB, leaning posteriorly. Postural control: Posterior lean     Standing balance comment: deferred                            Cognition Arousal: Alert Behavior During Therapy: Flat affect Overall Cognitive Status: No family/caregiver present to determine baseline cognitive functioning Area of Impairment: Following commands, Safety/judgement, Problem solving, Orientation                 Orientation  Level: Disoriented to, Place, Time, Situation (Knows he lives at [facility] [place] [facility].)     Following Commands: Follows one step commands inconsistently Safety/Judgement: Decreased awareness of safety, Decreased awareness of deficits   Problem Solving: Decreased initiation,  Difficulty sequencing, Requires verbal cues, Requires tactile cues, Slow processing General Comments: More alert today, difficulty answering questions throughout session. Seems to perseverate on his answers [facility]) was stated multiple times.        Exercises Other Exercises Other Exercises: Seated balance working on leaning, forward weight shift, and midline control. Up to Mod assist at times for posterior LOB but could hold at [facility] level for about 30 second durations with bil hand support.    General Comments General comments (skin integrity, edema, etc.): Assisted with cleaning hands and legs. Appeared to be covered in stool from colostomy that was lying on bed; this must have been old, as new colostomy was in place and looks clean.      Pertinent Vitals/Pain Pain Assessment Pain Assessment: No/denies pain    Home Living                          Prior Function            PT Goals (current goals can now be found in the care plan section) Acute Rehab PT Goals Patient Stated Goal: unable to state PT Goal Formulation: Patient unable to participate in goal setting Time For Goal Achievement: 07/31/23 Potential to Achieve Goals: Fair Progress towards PT goals: Progressing toward goals    Frequency    Min 1X/week      PT Plan      Co-evaluation              AM-PAC PT "6 Clicks" Mobility   Outcome Measure  Help needed turning from your back to your side while in a flat bed without using bedrails?: A Lot Help needed moving from lying on your back to sitting on the side of a flat bed without using bedrails?: A Lot Help needed moving to and from a bed to a chair (including a wheelchair)?: Total Help needed standing up from a chair using your arms (e.g., wheelchair or bedside chair)?: Total Help needed to walk in hospital room?: Total Help needed climbing 3-5 steps with a railing? : Total 6 Click Score: 8    End of Session   Activity Tolerance:  Patient tolerated treatment well Patient left: in bed;with call bell/phone within reach;with bed alarm set;with restraints reapplied (wrists, mitts) Nurse Communication: Mobility status (Condom cath not in place.) PT Visit Diagnosis: Muscle weakness (generalized) (M62.81);Difficulty in walking, not elsewhere classified (R26.2);Other symptoms and signs involving the nervous system (R29.898)     Time: 1610-9604 PT Time Calculation (min) (ACUTE ONLY): 18 min  Charges:    $Therapeutic Activity: 8-22 mins PT General Charges $$ ACUTE PT VISIT: 1 Visit                     Jory Ng, PT, DPT [facility]  Rehabilitation Services Physical Therapist Office: [phone] Website: [email or web address]    Alinda Irani 07/29/2023, 2:03 PM

## 2023-07-29 NOTE — Consult Note (Signed)
 [facility] Psychiatric Consult Follow-up  Patient Name: [name]  MRN: [id number]  DOB: [birth year]  Consult Order details: [place]/severe agitation.   Mode of Visit: In person   Psychiatry Consult Evaluation  Service Date: July 29, 2023 LOS:  LOS: 13 days  Chief Complaint "I'm in pain everywhere"  Primary Psychiatric Diagnoses  Hyperactive delirium  2.  Somatic symptom disorder with predominant pain 3. History of MDD   Assessment  Gregory [name] is a 63 y.o. male admitted: Medicallyfor 07/15/2023  3:05 PM for acute agitated encephalopathy. 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   His current presentation of inattention, waxing and waning mental status, change over the last couple of days is most consistent with hyperactive delirium likely in the setting of multiple medications and uncontrolled pain. There is suspicion for somatic symptom disorder with predominant pain given chart review notable for multiple outpatient visits to chronic pain physicians and continued use of oxy throughout the years though notably nothing on PDMP. Main psychiatric diagnosis over the years has been depression/anxiety and patient and collateral denies any past psychiatric hospitalizations or suicide attempts. He reports depression but is unable to state what medications he has been on and whether or not they have helped. Current outpatient psychotropic medications from last admission included scheduled lexapro  20, oxy 15 TID, lyrica  25 TID, 100 BID, seroquel  100 at bedtime, PRN haldol  5 Q6H, oxy 5 Q6H and historically he was fully alert and mostly oriented on these medications. He was likely compliant with medications prior to admission since he was at the nursing home. On initial examination,  patient is alert, irritable, is not able to participate in sustained attention testing and is reporting pain. Will recommend decreasing seroquel , stopping klonopin , continuing depakote , making sure his pain is well controlled. I suspect that he has a low frustration tolerance with regards to his pain which contributes to agitation - was put on several anticholinergic or otherwise deliriogenic meds when in hyperactive delirium - finding right balance has been major challenge of this admission. Globally alertness and participation in PT has improved w/ reduction of medications with setback w/ recent UTI.    On reassessment 2/10, he was pleasant. He was clearly confused with some insight into deficits. Denied SI, HI, AH/VH. Later messaged by nursing staff that pt had increasing agitation unresponsive to PRN haldol ; put in OTO PRN for previously tolerated ativan  - caused ?hypotension when given a few days ago in setting of active UTI being conservative w/ 1 mg dose.   Diagnoses:  Active Hospital problems: Principal Problem:   Acute encephalopathy Active Problems:   Malnutrition of moderate degree    Plan   ## Psychiatric Medication Recommendations:  --continue seroquel  to 200 QHS for mood stabilization and insomnia given continued somnolence and concern for polypharmacy, consider decrease if continued somnolence without agitation --continue lexapro  20 daily for depression  --stop klonopin  1 BID for agitation  --continue depakote  500 Q8H for mood stabilization, can obtain depakote  level in a couple days if patient continues on medication and is still in the hospital --optimize pain regimen   -- OTO PRN ativan   ## Medical Decision Making Capacity: Not specifically addressed in this encounter  ## Further Work-up:  -- VPA level for tomorrow AM (albumin  low at 2.5  most recently)  --f/u ammonia level - 14 on 2/8 -- most recent EKG on 2/3 had QtC of 413 -- Pertinent labwork reviewed earlier this  admission includes: CMP (Cr 1.52), CBC, TSH, UDS  ## Disposition:-- There are no psychiatric contraindications to discharge at this time  ## Behavioral / Environmental: -Delirium Precautions: Delirium Interventions for Nursing and Staff: - RN to open blinds every AM. - To Bedside: Glasses, hearing aide, and pt's own shoes. Make available to patients. when possible and encourage use. - Encourage po fluids when appropriate, keep fluids within reach. - OOB to chair with meals. - Passive ROM exercises to all extremities with AM & PM care. - RN to assess orientation to person, time and place QAM and PRN. - Recommend extended visitation hours with familiar family/friends as feasible. - Staff to minimize disturbances at night. Turn off television when pt asleep or when not in use.   ## Safety and Observation Level:  - Based on my clinical evaluation, I estimate the patient to be at moderate risk of self harm in the current setting due to agitation. - At this time, we recommend  safety observation. This decision is based on my review of the chart including patient's history and current presentation, interview of the patient, mental status examination, and consideration of suicide risk including evaluating suicidal ideation, plan, intent, suicidal or self-harm behaviors, risk factors, and protective factors. This judgment is based on our ability to directly address suicide risk, implement suicide prevention strategies, and develop a safety plan while the patient is in the clinical setting. Please contact our team if there is a concern that risk level has changed.  CSSR Risk Category:C-SSRS RISK CATEGORY: No Risk  Suicide Risk Assessment: Patient has following modifiable risk factors for suicide: recklessness, which we are addressing by optimizing his medications Patient has following non-modifiable or demographic risk factors for suicide: male gender Patient has the following protective factors against  suicide: Supportive family, no history of suicide attempts, and no history of NSSIB  Thank you for this consult request. Recommendations have been communicated to the primary team.  We will continue to follow at this time.   Rhyan Radler A Quantavis Obryant       History of Present Illness  Relevant Aspects of [facility] Course:  Admitted on 07/15/2023 for acute agitated encephalopathy.  -Required precedex   -Acute encephalopathy thought to be sepsis related ?AKI vs CKD   ON events:  None. WBC down to 6.1 from 13.5 after started on ceftriaxone . Working well with PT and increased alertness over past 2-3 days per chart review.   Patient Report:  Patient seen in the midafternoon. When I saw, was fairly pleasant. Politely tried to defer consult several times "I'll talk to you about that later" - this was in setting of asking orientation questions he did not know the answer to. Some increased insight into deficits and drawing attn away from these. No SI, HI, AH/VH endorsed. In wrist restraints at time of consult.   Psych ROS:  Depression: reports always had depression Anxiety: reports anxiety  Mania (lifetime and current): denies  Psychosis: (lifetime and current): none  Collateral information:  Contacted daughter Alston Jerry at [phone] on 2/5 She reports when he was discharged from last hospitalization, he was confused but pleasant and calm. Was with him when he was admitted to hospital. States he is not himself. States his agitation beforehand is if they were taking a long time to get his medication. He was more confused and  didn't understand situation. Called her this morning and was with him last night, called her at 9pm. He was asking if she was coming to pick him up. He seemed confused about where he was last night. He seems a little more confused. Prior to admission, he was falling on the floor, trying to pee everywhere. Was not realizing where he was. On Monday nursing home told her he kept on  falling in the floor. He didn't call her as he usually does. Still occasionally staring off when she was with him in the hospital. Trying to feed himself but weak. Held his pain medication. She reports she felt like he was a zombie yesterday. Sometimes need xanax  when anxious. Yesterday he was telling her to clean up ice cream on floor. She thought perhaps he was bipolar. He can flip a switch really quickly, gets agitated easily. If he doesn't get what he wants he gets really mad, has always been like that. He's been a functional adult until his health declined. Struggles with anxiety and depression from his health decline. Reports only psychotropic she is aware that he used to take was lexapro  before all of his medical conditions.   ROS  Unable to obtain due to patient mental status  Psychiatric and Social History  Psychiatric History:  Information collected from patient, chart review  Prev Dx/Sx: MDD Current Psych Provider: none Home Meds (current): scheduled lexapro  20, oxy 15 TID, lyrica  25 TID, 100 BID, seroquel  100 at bedtime, PRN haldol  5 Q6H, oxy 5 Q6H Previous Med Trials: wellbutrin , cymbalta , gabapentin  Therapy: unknown  Prior Psych Hospitalization: denies  Prior Self Harm: denies Prior Violence: denies  Family Psych History: denies, reports sister struggles with substance use  Family Hx suicide: unknown  Social History:  Living Situation: Lives at [facility] for several years, no access to anything else  Access to weapons/lethal means: none   Substance History Denies substance use history, UDS+opiates, BZD. Patient required ativan , versed .  -In the past he used marijuana frequently, but then became dependent on pain meds   Exam Findings  Vital Signs:  Temp:  [97.4 F (36.3 C)-98.3 F (36.8 C)] 98 F (36.7 C) (02/10 1640) Pulse Rate:  [54-106] 60 (02/10 1640) Resp:  [19-20] 19 (02/10 0946) BP: (101-152)/(61-80) 110/67 (02/10 1640) SpO2:  [90 %-99 %] 95 % (02/10  1640) Weight:  [79.9 kg] 79.9 kg (02/10 0135) Blood pressure 110/67, pulse 60, temperature 98 F (36.7 C), temperature source Oral, resp. rate 19, height 6' (1.829 m), weight 79.9 kg, SpO2 95%. Body mass index is 23.89 kg/m.  Physical Exam Constitutional:      Appearance: He is obese.  HENT:     Head: Normocephalic.  Pulmonary:     Effort: Pulmonary effort is normal.  Neurological:     Mental Status: He is alert.    Mental Status Exam: unable to obtain due to somnolence General Appearance: Casual, naked under a blanket  Orientation:  to person  Memory:   unable to obtain  Concentration:  Concentration: Poor  Recall:  Poor  Attention  Fair  Eye Contact:  Poor  Speech:  soft, clear  Language:  Fair  Volume:  Normal  Mood: "I'll talk more tomorrow"  Affect:   pleasant   Thought Process:  unable to obtain   Thought Content:   unable to obtain  Suicidal Thoughts:  denied  Homicidal Thoughts:  unable to obtain  Judgement:  unable to obtain  Insight:  unable to obtain  Psychomotor Activity:  Decreased  Akathisia:  No  Fund of Knowledge:  Unable to obtain      Assets: some ability to communicate needs  Cognition:  Impaired, mild  ADL's:  Impaired  AIMS (if indicated):        Other History   These have been pulled in through the EMR, reviewed, and updated if appropriate.  Family History:  The patient's family history includes Bone cancer in his father; Cancer in his father, mother, paternal grandfather, and another family member; Diabetes in his mother and another family member; Heart disease in his mother; Prostate cancer in his father.  Medical History: 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)    Acquired absence of right leg below knee (HCC)    Acute osteomyelitis of calcaneum, right (HCC) 10/02/2016   Acute respiratory failure (HCC)    Amputation stump infection (HCC) 08/12/2018   Anemia    Basal cell carcinoma, eyelid     Cancer (HCC)    Candidiasis 08/12/2018   CHF (congestive heart failure) (HCC)    chronic diastolic    Chronic back pain    Chronic kidney disease    stage 3    Chronic multifocal osteomyelitis (HCC)    of ankle and foot    Chronic pain syndrome    COPD (chronic obstructive pulmonary disease) (HCC)    DDD (degenerative disc disease), lumbar    Depression    major depressive disorder    Diabetes mellitus without complication (HCC)    type 2    Diabetic ulcer of toe of left foot (HCC) 10/02/2016   Difficult intubation    Difficulty in walking, not elsewhere classified    Epidural abscess 10/02/2016   Foot drop, bilateral    Foot drop, right 12/27/2015   GERD (gastroesophageal reflux disease)    Herpesviral vesicular dermatitis    History of COVID-19 03/15/2022   Hyperlipidemia    Hyperlipidemia    Hypertension    Insomnia    Malignant melanoma of other parts of face (HCC)    Melanoma (HCC)    MRSA bacteremia    Muscle weakness (generalized)    Nasal congestion    Necrosis of toe (HCC) 07/08/2018   Neuromuscular dysfunction of bladder    Osteomyelitis (HCC)    Osteomyelitis (HCC)    Osteomyelitis (HCC)    right BKA   Other idiopathic peripheral autonomic neuropathy    Retention of urine, unspecified    Squamous cell carcinoma of skin    Stroke (HCC)    Unsteadiness on feet    Urinary retention    Urinary retention    Urinary tract infection    Wears dentures    Wears glasses     Surgical History: Past Surgical History:  Procedure Laterality Date   AMPUTATION Left 03/29/2017   Procedure: LEFT GREAT TOE AMPUTATION AT METATARSOPHALANGEAL JOINT;  Surgeon: Timothy Ford, MD;  Location: [facility] OR;  Service: Orthopedics;  Laterality: Left;   AMPUTATION Right 03/29/2017   Procedure: RIGHT BELOW KNEE AMPUTATION;  Surgeon: Timothy Ford, MD;  Location: [facility] OR;  Service: Orthopedics;  Laterality: Right;   AMPUTATION Left 06/06/2018   Procedure: LEFT 2ND TOE AMPUTATION;   Surgeon: Timothy Ford, MD;  Location: [facility] OR;  Service: Orthopedics;  Laterality: Left;   AMPUTATION Right 11/19/2018   Procedure: AMPUTATION ABOVE KNEE;  Surgeon: Timothy Ford, MD;  Location: [facility] OR;  Service: Orthopedics;  Laterality: Right;  ANTERIOR CERVICAL CORPECTOMY N/A 11/25/2015   Procedure: ANTERIOR CERVICAL FIVE CORPECTOMY Cervical four - six fusion;  Surgeon: Augusto Blonder, MD;  Location: [facility];  Service: Neurosurgery;  Laterality: N/A;  ANTERIOR CERVICAL FIVE CORPECTOMY Cervical four - six fusion   APPLICATION OF WOUND VAC Right 10/22/2018   Procedure: Application Of Wound Vac;  Surgeon: Timothy Ford, MD;  Location: [facility] OR;  Service: Orthopedics;  Laterality: Right;   CYSTOSCOPY WITH BIOPSY N/A 05/01/2022   Procedure: CYSTOSCOPY WITH URETHRAL BIOPSY;  Surgeon: Roxane Copp, MD;  Location: [facility] ORS;  Service: Urology;  Laterality: N/A;  1 HR   CYSTOSCOPY WITH RETROGRADE URETHROGRAM N/A 04/25/2018   Procedure: CYSTOSCOPY WITH RETROGRADE URETHROGRAM/ BALLOON DILATION;  Surgeon: Ottelin, Mark, MD;  Location: [facility] ORS;  Service: Urology;  Laterality: N/A;   CYSTOSCOPY WITH URETHRAL DILATATION N/A 05/01/2022   Procedure: BALLOON DILATION WITH  OPTILUME;  Surgeon: Roxane Copp, MD;  Location: [facility] ORS;  Service: Urology;  Laterality: N/A;   LAPAROTOMY N/A 12/03/2022   Procedure: EXPLORATORY LAPAROTOMY  total abdominal colectomy with end ileostomy;  Surgeon: Kinsinger, Alphonso Aschoff, MD;  Location: [facility] OR;  Service: General;  Laterality: N/A;   MULTIPLE TOOTH EXTRACTIONS     POSTERIOR CERVICAL FUSION/FORAMINOTOMY N/A 11/29/2015   Procedure: Cervical Three-Cervical Seven Posterior Cervical Laminectomy with Fusion;  Surgeon: Augusto Blonder, MD;  Location: [facility];  Service: Neurosurgery;  Laterality: N/A;  Cervical Three-Cervical Seven Posterior Cervical Laminectomy with Fusion   STUMP REVISION Right 10/22/2018   Procedure: REVISION RIGHT BELOW KNEE AMPUTATION;  Surgeon: Timothy Ford, MD;  Location: [facility] OR;  Service: Orthopedics;  Laterality: Right;   STUMP REVISION Right 12/05/2018   Procedure: Revision Right Above Knee Amputation;  Surgeon: Timothy Ford, MD;  Location: [facility] OR;  Service: Orthopedics;  Laterality: Right;   STUMP REVISION Right 05/29/2019   Procedure: REVISION RIGHT ABOVE KNEE AMPUTATION;  Surgeon: Timothy Ford, MD;  Location: [facility] OR;  Service: Orthopedics;  Laterality: Right;   STUMP REVISION Right 06/24/2019   Procedure: STUMP REVISION;  Surgeon: Timothy Ford, MD;  Location: [facility] OR;  Service: Orthopedics;  Laterality: Right;     Medications:   Current Facility-Administered Medications:    acetaminophen  (TYLENOL ) tablet 650 mg, 650 mg, Oral, Q6H, Josiah Nigh, MD, 650 mg at 07/29/23 0559   arformoterol  (BROVANA ) nebulizer solution 15 mcg, 15 mcg, Nebulization, BID, Agarwala, Ravi, MD, 15 mcg at 07/29/23 0946   aspirin  EC tablet 81 mg, 81 mg, Oral, Daily, Hunsucker, Archer Kobs, MD, 81 mg at 07/29/23 1041   atorvastatin  (LIPITOR ) tablet 80 mg, 80 mg, Oral, QHS, Autry, Lauren E, [name], 80 mg at 07/28/23 2201   budesonide  (PULMICORT ) nebulizer solution 0.5 mg, 0.5 mg, Nebulization, BID, Payne, John D, [name], 0.5 mg at 07/29/23 4627   carvedilol  (COREG ) tablet 12.5 mg, 12.5 mg, Oral, BID WC, Autry, Lauren E, [name], 12.5 mg at 07/29/23 0825   cefTRIAXone  (ROCEPHIN ) 1 g in sodium chloride  0.9 % 100 mL IVPB, 1 g, Intravenous, Q24H, Calkins, Derek W, DO, Last Rate: 200 mL/hr at 07/28/23 1712, 1 g at 07/28/23 1712   Chlorhexidine  Gluconate Cloth 2 % PADS 6 each, 6 each, Topical, Daily, Josiah Nigh, MD, 6 each at 07/27/23 1226   divalproex  (DEPAKOTE ) DR tablet 500 mg, 500 mg, Oral, Q8H, Clark, Laura P, DO, 500 mg at 07/29/23 0600   docusate sodium  (COLACE) capsule 100 mg, 100 mg, Oral, BID PRN, Payne, John D, [name]  escitalopram  (LEXAPRO ) tablet 20 mg, 20 mg, Oral, Daily, Autry, Lauren E, [name], 20 mg at 07/29/23 1041   famotidine  (PEPCID ) tablet 20 mg, 20  mg, Oral, Daily, Payne, John D, [name], 20 mg at 07/29/23 1041   feeding supplement (ENSURE ENLIVE / ENSURE PLUS) liquid 237 mL, 237 mL, Oral, BID BM, Hunsucker, Archer Kobs, MD, 237 mL at 07/27/23 0944   haloperidol  lactate (HALDOL ) injection 5 mg, 5 mg, Intravenous, Q6H PRN, Hunsucker, Archer Kobs, MD, 5 mg at 07/29/23 1230   haloperidol  lactate (HALDOL ) injection 5 mg, 5 mg, Intramuscular, Once, Paliwal, Aditya, MD   heparin  injection 5,000 Units, 5,000 Units, Subcutaneous, Q8H, Payne, John D, [name], 5,000 Units at 07/29/23 5621   hydrALAZINE  (APRESOLINE ) injection 10-40 mg, 10-40 mg, Intravenous, Q4H PRN, Marlys Singh, John D, [name]   hydrALAZINE  (APRESOLINE ) tablet 10 mg, 10 mg, Oral, Q8H, Autry, Lauren E, [name], 10 mg at 07/28/23 2242   HYDROmorphone  (DILAUDID ) injection 1 mg, 1 mg, Intravenous, Q4H PRN, Josiah Nigh, MD, 1 mg at 07/29/23 1230   HYDROmorphone  (DILAUDID ) tablet 2 mg, 2 mg, Oral, Q6H, Josiah Nigh, MD, 2 mg at 07/29/23 0600   ipratropium-albuterol  (DUONEB) 0.5-2.5 (3) MG/3ML nebulizer solution 3 mL, 3 mL, Nebulization, Q4H PRN, Payne, John D, [name]   LORazepam  (ATIVAN ) tablet 1 mg, 1 mg, Oral, Once PRN **OR** LORazepam  (ATIVAN ) injection 1 mg, 1 mg, Intravenous, Once PRN, Cynthis Purington A   multivitamin with minerals tablet 1 tablet, 1 tablet, Oral, Daily, Calkins, Derek W, DO, 1 tablet at 07/29/23 1041   Oral care mouth rinse, 15 mL, Mouth Rinse, PRN, Ramaswamy, Murali, MD   pantoprazole  (PROTONIX ) EC tablet 40 mg, 40 mg, Oral, Daily, Autry, Lauren E, [name], 40 mg at 07/29/23 1041   polyethylene glycol (MIRALAX  / GLYCOLAX ) packet 17 g, 17 g, Oral, Daily PRN, Marlys Singh, John D, [name]   pregabalin  (LYRICA ) capsule 200 mg, 200 mg, Oral, BID, Joesph Mussel, DO, 200 mg at 07/29/23 1041   QUEtiapine  (SEROQUEL ) tablet 200 mg, 200 mg, Oral, QHS, Bolaji, Adepeju, NP, 200 mg at 07/28/23 2201   revefenacin  (YUPELRI ) nebulizer solution 175 mcg, 175 mcg, Nebulization, Daily, Payne, John D, [name], 175  mcg at 07/29/23 3086   sodium bicarbonate  tablet 650 mg, 650 mg, Oral, BID, Hunsucker, Archer Kobs, MD, 650 mg at 07/29/23 1041   tamsulosin  (FLOMAX ) capsule 0.4 mg, 0.4 mg, Oral, Daily, Calkins, Derek W, DO, 0.4 mg at 07/29/23 1041   [COMPLETED] thiamine  (VITAMIN B1) 500 mg in sodium chloride  0.9 % 50 mL IVPB, 500 mg, Intravenous, Q8H, Last Rate: 110 mL/hr at 07/21/23 0600, Infusion Verify at 07/21/23 0600 **FOLLOWED BY** thiamine  (VITAMIN B1) tablet 100 mg, 100 mg, Oral, Daily, Josiah Nigh, MD, 100 mg at 07/29/23 1041  Allergies: Allergies  Allergen Reactions   Latex Other (See Comments)    Unknown reaction    Daivd Dub A Jaylene Schrom

## 2023-07-29 NOTE — Plan of Care (Signed)

## 2023-07-30 DIAGNOSIS — G9340 Encephalopathy, unspecified: Secondary | ICD-10-CM | POA: Diagnosis not present

## 2023-07-30 LAB — CBC
HCT: 29.7 % — ABNORMAL LOW (ref 39.0–52.0)
Hemoglobin: 9.5 g/dL — ABNORMAL LOW (ref 13.0–17.0)
MCH: 29.3 pg (ref 26.0–34.0)
MCHC: 32 g/dL (ref 30.0–36.0)
MCV: 91.7 fL (ref 80.0–100.0)
Platelets: 123 10*3/uL — ABNORMAL LOW (ref 150–400)
RBC: 3.24 MIL/uL — ABNORMAL LOW (ref 4.22–5.81)
RDW: 15.6 % — ABNORMAL HIGH (ref 11.5–15.5)
WBC: 5.2 10*3/uL (ref 4.0–10.5)
nRBC: 0 % (ref 0.0–0.2)

## 2023-07-30 LAB — BASIC METABOLIC PANEL
Anion gap: 11 (ref 5–15)
BUN: 24 mg/dL — ABNORMAL HIGH (ref 8–23)
CO2: 22 mmol/L (ref 22–32)
Calcium: 9.1 mg/dL (ref 8.9–10.3)
Chloride: 104 mmol/L (ref 98–111)
Creatinine, Ser: 2.06 mg/dL — ABNORMAL HIGH (ref 0.61–1.24)
GFR, Estimated: 35 mL/min — ABNORMAL LOW (ref >60)
Glucose, Bld: 66 mg/dL — ABNORMAL LOW (ref 70–99)
Potassium: 4.6 mmol/L (ref 3.5–5.1)
Sodium: 137 mmol/L (ref 135–145)

## 2023-07-30 LAB — GLUCOSE, CAPILLARY: Glucose-Capillary: 139 mg/dL — ABNORMAL HIGH (ref 70–99)

## 2023-07-30 LAB — MAGNESIUM: Magnesium: 2.1 mg/dL (ref 1.7–2.4)

## 2023-07-30 LAB — VALPROIC ACID LEVEL: Valproic Acid Lvl: 41 ug/mL — ABNORMAL LOW (ref 50.0–100.0)

## 2023-07-30 MED ORDER — PREGABALIN 100 MG PO CAPS
100.0000 mg | ORAL_CAPSULE | Freq: Two times a day (BID) | ORAL | Status: DC
  Administered 2023-07-30 – 2023-07-31 (×2): 100 mg via ORAL
  Filled 2023-07-30 (×3): qty 1

## 2023-07-30 MED ORDER — DEXTROSE-SODIUM CHLORIDE 5-0.45 % IV SOLN: INTRAVENOUS | Status: AC

## 2023-07-30 MED ORDER — LORAZEPAM 2 MG/ML IJ SOLN
INTRAMUSCULAR | Status: AC
  Filled 2023-07-30: qty 1

## 2023-07-30 NOTE — Progress Notes (Signed)
Occupational Therapy Treatment Patient Details Name: Gregory [name] MRN: [id number] DOB: May 20, 1961 Today's Date: 07/30/2023   History of present illness 63 yo male from [place] admitted 1/27 with AMS; Acute encephalopathy/agitation delirium. 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   OT comments  Pt progressing toward goals, more alert this session, pt needing mod A for UB ADL and set up A for grooming task from bed/chair position. Pt able to pull self up in bed using HOB bedrail and able to use side rails to pull back off of bed for pillow placement/pad change. Pt following commands with increased time, cues to locate items on L side due to visual deficits. Pt seeing "family members" in room toward end of session and yelling that he needs to "get out of here". Pt presenting with impairments listed below, will follow acutely. Patient will benefit from continued inpatient follow up therapy, <3 hours/day to maximize safety/ind with ADL/functional mobility.       If plan is discharge home, recommend the following:  Two people to help with walking and/or transfers;Two people to help with bathing/dressing/bathroom;Assistance with cooking/housework;Assistance with feeding;Direct supervision/assist for medications management;Direct supervision/assist for financial management;Assist for transportation;Help with stairs or ramp for entrance   Equipment Recommendations  Other (comment) (defer)    Recommendations for Other Services      Precautions / Restrictions Precautions Precautions: Fall;Other (comment) Precaution/Restrictions Comments: Colostomy on Rt Restrictions Other Position/Activity Restrictions: No weight bearing restricitions  per orders or prior PT notes       Mobility Bed Mobility Overal bed mobility: Needs Assistance             General bed mobility comments: pt able to pull self up in bed with BUE guided to [facility] bed rails    Transfers                         Balance                                           ADL either performed or assessed with clinical judgement   ADL Overall ADL's : Needs assistance/impaired Eating/Feeding: Set up;Sitting   Grooming: Wash/dry face;Set up           Upper Body Dressing : Moderate assistance Upper Body Dressing Details (indicate cue type and reason): to put L arm through sleeve                        Extremity/Trunk Assessment Upper Extremity Assessment Upper Extremity Assessment: Generalized weakness   Lower Extremity Assessment Lower Extremity Assessment: Defer to PT evaluation        Vision   Additional Comments: noted some difficutly locating bed rail on L side, per chart review 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   Perception Perception Perception: Not tested   Praxis Praxis Praxis: Not tested   Communication Communication Communication: No apparent difficulties   Cognition Arousal: Alert Behavior During Therapy: Flat affect Cognition: No family/caregiver present to determine baseline             OT - Cognition Comments: pt reports seeing family  members in room who are not present                 Following commands: Impaired Following commands impaired: Follows one step commands inconsistently      Cueing   Cueing Techniques: Verbal cues, Tactile cues  Exercises Other Exercises Other Exercises: pulling back off of bed using bil bedrails x10    Shoulder Instructions       General Comments VSS, pt yelling out toward end of session, seeing things that are not present in room    Pertinent Vitals/ Pain       Pain Assessment Pain Assessment: Faces Pain Location: generalized Pain  Descriptors / Indicators: Grimacing, Moaning Pain Intervention(s): Repositioned, Limited activity within patient's tolerance, Monitored during session  Home Living                                          Prior Functioning/Environment              Frequency  Min 1X/week        Progress Toward Goals  OT Goals(current goals can now be found in the care plan section)  Progress towards OT goals: Progressing toward goals  Acute Rehab OT Goals OT Goal Formulation: With patient Time For Goal Achievement: 07/31/23 Potential to Achieve Goals: Good ADL Goals Pt Will Perform Eating: with set-up;sitting;bed level Pt Will Perform Grooming: with min assist;sitting;bed level Pt Will Perform Upper Body Dressing: with min assist;sitting Additional ADL Goal #1: Pt will sit with CGA x 10 minutes in preparation for ADLs. Additional ADL Goal #2: Pt will be demonstrate sustained attention as a precursor to ADLs.  Plan      Co-evaluation                 AM-PAC OT "6 Clicks" Daily Activity     Outcome Measure   Help from another person eating meals?: A Little Help from another person taking care of personal grooming?: A Little Help from another person toileting, which includes using toliet, bedpan, or urinal?: Total Help from another person bathing (including washing, rinsing, drying)?: A Lot Help from another person to put on and taking off regular upper body clothing?: A Lot Help from another person to put on and taking off regular lower body clothing?: A Lot 6 Click Score: 13    End of Session    OT Visit Diagnosis: Muscle weakness (generalized) (M62.81);Other symptoms and signs involving cognitive function   Activity Tolerance Patient tolerated treatment well   Patient Left in bed;with call bell/phone within reach;with bed alarm set   Nurse Communication Mobility status        Time: 1047-1105 OT Time Calculation (min): 18 min  Charges: OT  General Charges $OT Visit: 1 Visit OT Treatments $Self Care/Home Management : 8-22 mins  Carver Fila, OTD, OTR/L SecureChat Preferred Acute Rehab [phone]   [name] 07/30/2023, 12:24 PM

## 2023-07-30 NOTE — Inpatient Diabetes Management (Signed)
Inpatient Diabetes Program Recommendations  AACE/ADA: New Consensus Statement on Inpatient Glycemic Control (2015)  Target Ranges:  Prepandial:   less than 140 mg/dL      Peak postprandial:   less than 180 mg/dL (1-2 hours)      Critically ill patients:  140 - 180 mg/dL   Lab Results  Component Value Date   GLUCAP 113 (H) 07/28/2023   HGBA1C 6.4 (H) 06/27/2023    Review of Glycemic Control  Latest Reference Range & Units 07/29/23 08:51 07/30/23 06:16  Glucose 70 - 99 mg/dL 77 66 (L)   Diabetes history: DM 2 Outpatient Diabetes medications:  Mounjaro 2.5 mg QTuesday, Lantus 15 units qhs, Humalog 0-10 units tid and hs Current orders for Inpatient glycemic control:  None  A1c 6.4% on 1/9 (possibly having hypoglycemia at home)  Inpatient Diabetes Program Recommendations:    Note: hypoglycemia in labs today  -   Check CBGs achs and possibly add Novolog 0-6 units tid + hs starting at 151 mg/dl.  Thanks,  Christena Deem RN, MSN, BC-ADM Inpatient Diabetes Coordinator Team Pager [phone] (8a-5p)\

## 2023-07-30 NOTE — Consult Note (Signed)
[facility] Psychiatric Consult Follow-up  Patient Name: .Gregory [name]  MRN: [id number]  DOB: 13-Sep-1960  Consult Order details: [place]/severe agitation.   Mode of Visit: In person   Psychiatry Consult Evaluation  Service Date: July 30, 2023 LOS:  LOS: 14 days  Chief Complaint "I'm in pain everywhere"  Primary Psychiatric Diagnoses  Hyperactive delirium  2.  Somatic symptom disorder with predominant pain 3. History of MDD   Assessment  Gregory [name] is a 63 y.o. male admitted: Medicallyfor 07/15/2023  3:05 PM for acute agitated encephalopathy. 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   His current presentation of inattention, waxing and waning mental status, change over the last couple of days is most consistent with hyperactive delirium likely in the setting of multiple medications and uncontrolled pain. There is suspicion for somatic symptom disorder with predominant pain given chart review notable for multiple outpatient visits to chronic pain physicians and continued use of oxy throughout the years though notably nothing on PDMP. Main psychiatric diagnosis over the years has been depression/anxiety and patient and collateral denies any past psychiatric hospitalizations or suicide attempts. He reports depression but is unable to state what medications he has been on and whether or not they have helped. Current outpatient psychotropic medications from last admission included scheduled lexapro 20, oxy 15 TID, lyrica 25 TID, 100 BID, seroquel 100 at bedtime, PRN haldol 5 Q6H, oxy 5 Q6H and historically he was fully alert and mostly oriented on these medications. He was likely compliant with medications prior to admission since he was at the nursing home. On initial examination,  patient is alert, irritable, is not able to participate in sustained attention testing and is reporting pain. Will recommend decreasing seroquel, stopping klonopin, continuing depakote, making sure his pain is well controlled. I suspect that he has a low frustration tolerance with regards to his pain which contributes to agitation - was put on several anticholinergic or otherwise deliriogenic meds when in hyperactive delirium - finding right balance has been major challenge of this admission. Globally alertness and participation in PT has improved w/ reduction of medications with setback w/ recent UTI.    On reassessment 2/11, he was pleasant. Calm and cooperative but still confused. Denied any s/e to meds - defers most decisions/discussion of r/b to daughter. Agreeable to overall goal of treating anger/agitation w/o sedation.  Diagnoses:  Active Hospital problems: Principal Problem:   Acute encephalopathy Active Problems:   Malnutrition of moderate degree    Plan  ## Psychiatric Medication Recommendations:  --continue seroquel to 200 QHS for mood stabilization and insomnia given continued somnolence and concern for polypharmacy, consider decrease if continued somnolence without agitation --continue lexapro 20 daily for depression  --stop klonopin 1 BID for agitation  --continue depakote 500 Q8H for mood stabilization, can obtain depakote level in a couple days if patient continues on medication and is still in the hospital --optimize pain regimen   -- OTO PRN ativan  ## Medical Decision Making Capacity: Not specifically addressed in this encounter  ## Further Work-up:  -- VPA level for tomorrow AM (albumin low at 2.5 most recently)  - VPA level 41 - will hold on further increases given clinical effect d/t low albumin.   --f/u ammonia level - 14 on 2/8 --  most recent EKG on 2/3 had QtC of 413 -- Pertinent labwork reviewed earlier this admission includes: CMP (Cr 1.52), CBC, TSH,  UDS  ## Disposition:-- There are no psychiatric contraindications to discharge at this time  ## Behavioral / Environmental: -Delirium Precautions: Delirium Interventions for Nursing and Staff: - RN to open blinds every AM. - To Bedside: Glasses, hearing aide, and pt's own shoes. Make available to patients. when possible and encourage use. - Encourage po fluids when appropriate, keep fluids within reach. - OOB to chair with meals. - Passive ROM exercises to all extremities with AM & PM care. - RN to assess orientation to person, time and place QAM and PRN. - Recommend extended visitation hours with familiar family/friends as feasible. - Staff to minimize disturbances at night. Turn off television when pt asleep or when not in use.   ## Safety and Observation Level:  - Based on my clinical evaluation, I estimate the patient to be at moderate risk of self harm in the current setting due to agitation. - At this time, we recommend  safety observation. This decision is based on my review of the chart including patient's history and current presentation, interview of the patient, mental status examination, and consideration of suicide risk including evaluating suicidal ideation, plan, intent, suicidal or self-harm behaviors, risk factors, and protective factors. This judgment is based on our ability to directly address suicide risk, implement suicide prevention strategies, and develop a safety plan while the patient is in the clinical setting. Please contact our team if there is a concern that risk level has changed.  CSSR Risk Category:C-SSRS RISK CATEGORY: No Risk  Suicide Risk Assessment: Patient has following modifiable risk factors for suicide: recklessness, which we are addressing by optimizing his medications Patient has following non-modifiable or demographic risk factors for suicide: male gender Patient has the following protective factors against suicide: Supportive family, no history of suicide  attempts, and no history of NSSIB  Thank you for this consult request. Recommendations have been communicated to the primary team.  We will continue to follow at this time.   Clerance Umland A Shanecia Hoganson       History of Present Illness  Relevant Aspects of [facility] Course:  Admitted on 07/15/2023 for acute agitated encephalopathy.  -Required precedex  -Acute encephalopathy thought to be sepsis related ?AKI vs CKD   ON events:  Pt agitated after assessment yesteday d/w nurse via epic chat and ordered loraepam.   Patient Report:  Patient seen in the midafternoon. Allowed me to cover him wih blanket and did not kick it off today. When I saw, was fairly pleasant but remained confused. Feeding self with some help from nurse - no reports of agitation today. Not oriented to month, year, date, etc. Did not know what major holiday was Feb 14th - thinks he would normally know this. No SI, HI, AH/VH denied s/e ot mds specifically muscle tightness and twitching.   According to nurse, sometimes seeing family (daughter) out of the corner of his eyes/thinks she is visiting more than she is.  Psych ROS:  Depression: reports always had depression Anxiety: reports anxiety  Mania (lifetime and current): denies  Psychosis: (lifetime and current): none  Collateral information:  Called daughter Jennefer Bravo 2/11 x1 no response  Contacted daughter [name] at [phone] on 2/5 She reports when he was discharged from last hospitalization, he was confused but pleasant and calm. Was with him when he was admitted to hospital. States he is not himself. States  his agitation beforehand is if they were taking a long time to get his medication. He was more confused and didn't understand situation. Called her this morning and was with him last night, called her at 9pm. He was asking if she was coming to pick him up. He seemed confused about where he was last night. He seems a little more confused. Prior to admission, he was  falling on the floor, trying to pee everywhere. Was not realizing where he was. On Monday nursing home told her he kept on falling in the floor. He didn't call her as he usually does. Still occasionally staring off when she was with him in the hospital. Trying to feed himself but weak. Held his pain medication. She reports she felt like he was a zombie yesterday. Sometimes need xanax when anxious. Yesterday he was telling her to clean up ice cream on floor. She thought perhaps he was bipolar. He can flip a switch really quickly, gets agitated easily. If he doesn't get what he wants he gets really mad, has always been like that. He's been a functional adult until his health declined. Struggles with anxiety and depression from his health decline. Reports only psychotropic she is aware that he used to take was lexapro before all of his medical conditions.   ROS  Unable to obtain due to patient mental status  Psychiatric and Social History  Psychiatric History:  Information collected from patient, chart review  Prev Dx/Sx: MDD Current Psych Provider: none Home Meds (current): scheduled lexapro 20, oxy 15 TID, lyrica 25 TID, 100 BID, seroquel 100 at bedtime, PRN haldol 5 Q6H, oxy 5 Q6H Previous Med Trials: wellbutrin, cymbalta, gabapentin Therapy: unknown  Prior Psych Hospitalization: denies  Prior Self Harm: denies Prior Violence: denies  Family Psych History: denies, reports sister struggles with substance use  Family Hx suicide: unknown  Social History:  Living Situation: Lives at [facility] for several years, no access to anything else  Access to weapons/lethal means: none   Substance History Denies substance use history, UDS+opiates, BZD. Patient required ativan, versed.  -In the past he used marijuana frequently, but then became dependent on pain meds   Exam Findings  Vital Signs:  Temp:  [97.8 F (36.6 C)-98.4 F (36.9 C)] 98.4 F (36.9 C) (02/11 0807) Pulse Rate:  [51-60] 57  (02/11 0807) Resp:  [16-18] 17 (02/11 0807) BP: (103-114)/(60-69) 114/69 (02/11 0807) SpO2:  [95 %-100 %] 97 % (02/11 0807) Weight:  [79.6 kg] 79.6 kg (02/11 0501) Blood pressure 114/69, pulse (!) 57, temperature 98.4 F (36.9 C), resp. rate 17, height 6' (1.829 m), weight 79.6 kg, SpO2 97%. Body mass index is 23.8 kg/m.  Physical Exam Constitutional:      Appearance: He is obese.  HENT:     Head: Normocephalic.  Pulmonary:     Effort: Pulmonary effort is normal.  Neurological:     Mental Status: He is alert.    Mental Status Exam:  Orientation:  to person, globally situation/location   Memory:   unable to obtain  Concentration:  Concentration: Poor  Recall:  Poor  Attention  Fair  Eye Contact:  Poor  Speech:  soft, clear  Language:  Fair  Volume:  Normal  Mood: OK  Affect:   pleasant   Thought Process:  unable to obtain   Thought Content:   unable to obtain  Suicidal Thoughts:  denied  Homicidal Thoughts:  denied  Judgement:  unable to obtain  Insight:  unable  to obtain  Psychomotor Activity:  Decreased  Akathisia:  No  Fund of Knowledge:  Unable to obtain  Denied perceptual disturbances - likely intermittent VH  Assets: some ability to communicate needs  Cognition:  Impaired  ADL's:  Impaired  AIMS (if indicated):        Other History   These have been pulled in through the EMR, reviewed, and updated if appropriate.  Family History:  The patient's family history includes Bone cancer in his father; Cancer in his father, mother, paternal grandfather, and another family member; Diabetes in his mother and another family member; Heart disease in his mother; Prostate cancer in his father.  Medical History: 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)    Acquired absence of right leg below knee (HCC)    Acute osteomyelitis of calcaneum, right (HCC) 10/02/2016   Acute respiratory failure (HCC)    Amputation stump infection  (HCC) 08/12/2018   Anemia    Basal cell carcinoma, eyelid    Cancer (HCC)    Candidiasis 08/12/2018   CHF (congestive heart failure) (HCC)    chronic diastolic    Chronic back pain    Chronic kidney disease    stage 3    Chronic multifocal osteomyelitis (HCC)    of ankle and foot    Chronic pain syndrome    COPD (chronic obstructive pulmonary disease) (HCC)    DDD (degenerative disc disease), lumbar    Depression    major depressive disorder    Diabetes mellitus without complication (HCC)    type 2    Diabetic ulcer of toe of left foot (HCC) 10/02/2016   Difficult intubation    Difficulty in walking, not elsewhere classified    Epidural abscess 10/02/2016   Foot drop, bilateral    Foot drop, right 12/27/2015   GERD (gastroesophageal reflux disease)    Herpesviral vesicular dermatitis    History of COVID-19 03/15/2022   Hyperlipidemia    Hyperlipidemia    Hypertension    Insomnia    Malignant melanoma of other parts of face (HCC)    Melanoma (HCC)    MRSA bacteremia    Muscle weakness (generalized)    Nasal congestion    Necrosis of toe (HCC) 07/08/2018   Neuromuscular dysfunction of bladder    Osteomyelitis (HCC)    Osteomyelitis (HCC)    Osteomyelitis (HCC)    right BKA   Other idiopathic peripheral autonomic neuropathy    Retention of urine, unspecified    Squamous cell carcinoma of skin    Stroke (HCC)    Unsteadiness on feet    Urinary retention    Urinary retention    Urinary tract infection    Wears dentures    Wears glasses     Surgical History: Past Surgical History:  Procedure Laterality Date   AMPUTATION Left 03/29/2017   Procedure: LEFT GREAT TOE AMPUTATION AT METATARSOPHALANGEAL JOINT;  Surgeon: Nadara Mustard, MD;  Location: [facility] OR;  Service: Orthopedics;  Laterality: Left;   AMPUTATION Right 03/29/2017   Procedure: RIGHT BELOW KNEE AMPUTATION;  Surgeon: Nadara Mustard, MD;  Location: [facility] OR;  Service: Orthopedics;  Laterality: Right;    AMPUTATION Left 06/06/2018   Procedure: LEFT 2ND TOE AMPUTATION;  Surgeon: Nadara Mustard, MD;  Location: [facility] OR;  Service: Orthopedics;  Laterality: Left;   AMPUTATION Right 11/19/2018   Procedure: AMPUTATION ABOVE KNEE;  Surgeon: Nadara Mustard, MD;  Location: [facility] OR;  Service: Orthopedics;  Laterality: Right;   ANTERIOR CERVICAL CORPECTOMY N/A 11/25/2015   Procedure: ANTERIOR CERVICAL FIVE CORPECTOMY Cervical four - six fusion;  Surgeon: Lisbeth Renshaw, MD;  Location: [facility] OR[facility];  Service: Neurosurgery;  Laterality: N/A;  ANTERIOR CERVICAL FIVE CORPECTOMY Cervical four - six fusion   APPLICATION OF WOUND VAC Right 10/22/2018   Procedure: Application Of Wound Vac;  Surgeon: Nadara Mustard, MD;  Location: [facility] OR;  Service: Orthopedics;  Laterality: Right;   CYSTOSCOPY WITH BIOPSY N/A 05/01/2022   Procedure: CYSTOSCOPY WITH URETHRAL BIOPSY;  Surgeon: Noel Christmas, MD;  Location: [facility] ORS;  Service: Urology;  Laterality: N/A;  1 HR   CYSTOSCOPY WITH RETROGRADE URETHROGRAM N/A 04/25/2018   Procedure: CYSTOSCOPY WITH RETROGRADE URETHROGRAM/ BALLOON DILATION;  Surgeon: Ihor Gully, MD;  Location: [facility] ORS;  Service: Urology;  Laterality: N/A;   CYSTOSCOPY WITH URETHRAL DILATATION N/A 05/01/2022   Procedure: BALLOON DILATION WITH  OPTILUME;  Surgeon: Noel Christmas, MD;  Location: [facility] ORS;  Service: Urology;  Laterality: N/A;   LAPAROTOMY N/A 12/03/2022   Procedure: EXPLORATORY LAPAROTOMY  total abdominal colectomy with end ileostomy;  Surgeon: Kinsinger, De Blanch, MD;  Location: [facility] OR;  Service: General;  Laterality: N/A;   MULTIPLE TOOTH EXTRACTIONS     POSTERIOR CERVICAL FUSION/FORAMINOTOMY N/A 11/29/2015   Procedure: Cervical Three-Cervical Seven Posterior Cervical Laminectomy with Fusion;  Surgeon: Lisbeth Renshaw, MD;  Location: [facility];  Service: Neurosurgery;  Laterality: N/A;  Cervical Three-Cervical Seven Posterior Cervical Laminectomy with Fusion   STUMP REVISION Right 10/22/2018    Procedure: REVISION RIGHT BELOW KNEE AMPUTATION;  Surgeon: Nadara Mustard, MD;  Location: [facility] OR;  Service: Orthopedics;  Laterality: Right;   STUMP REVISION Right 12/05/2018   Procedure: Revision Right Above Knee Amputation;  Surgeon: Nadara Mustard, MD;  Location: [facility] OR;  Service: Orthopedics;  Laterality: Right;   STUMP REVISION Right 05/29/2019   Procedure: REVISION RIGHT ABOVE KNEE AMPUTATION;  Surgeon: Nadara Mustard, MD;  Location: [facility] OR;  Service: Orthopedics;  Laterality: Right;   STUMP REVISION Right 06/24/2019   Procedure: STUMP REVISION;  Surgeon: Nadara Mustard, MD;  Location: [facility] OR;  Service: Orthopedics;  Laterality: Right;     Medications:   Current Facility-Administered Medications:    acetaminophen (TYLENOL) tablet 650 mg, 650 mg, Oral, Q6H, Lorin Glass, MD, 650 mg at 07/29/23 1806   arformoterol (BROVANA) nebulizer solution 15 mcg, 15 mcg, Nebulization, BID, Agarwala, Ravi, MD, 15 mcg at 07/30/23 0750   aspirin EC tablet 81 mg, 81 mg, Oral, Daily, Hunsucker, Lesia Sago, MD, 81 mg at 07/30/23 0981   atorvastatin (LIPITOR) tablet 80 mg, 80 mg, Oral, QHS, Cristopher Peru, [name], 80 mg at 07/29/23 2207   budesonide (PULMICORT) nebulizer solution 0.5 mg, 0.5 mg, Nebulization, BID, Lidia Collum, [name], 0.5 mg at 07/30/23 0750   carvedilol (COREG) tablet 12.5 mg, 12.5 mg, Oral, BID WC, Autry, Lauren E, [name], 12.5 mg at 07/30/23 1914   cefTRIAXone (ROCEPHIN) 1 g in sodium chloride 0.9 % 100 mL IVPB, 1 g, Intravenous, Q24H, Calkins, Derek W, DO, Last Rate: 200 mL/hr at 07/28/23 1712, 1 g at 07/28/23 1712   Chlorhexidine Gluconate Cloth 2 % PADS 6 each, 6 each, Topical, Daily, Lorin Glass, MD, 6 each at 07/27/23 1226   dextrose 5 % and 0.45 % NaCl infusion, , Intravenous, Continuous, Deanna Artis, DO, Last Rate: 100 mL/hr at 07/30/23 0841, New Bag at 07/30/23 [phone]  divalproex (DEPAKOTE) DR tablet 500 mg, 500 mg, Oral, Q8H, Steffanie Varden, DO, 500 mg at 07/29/23 1805   docusate  sodium (COLACE) capsule 100 mg, 100 mg, Oral, BID PRN, Lidia Collum, [name]   escitalopram (LEXAPRO) tablet 20 mg, 20 mg, Oral, Daily, Autry, Lauren E, [name], 20 mg at 07/30/23 8119   famotidine (PEPCID) tablet 20 mg, 20 mg, Oral, Daily, Lidia Collum, [name], 20 mg at 07/30/23 1478   feeding supplement (ENSURE ENLIVE / ENSURE PLUS) liquid 237 mL, 237 mL, Oral, BID BM, Hunsucker, Lesia Sago, MD, 237 mL at 07/30/23 2956   haloperidol lactate (HALDOL) injection 5 mg, 5 mg, Intravenous, Q6H PRN, Hunsucker, Lesia Sago, MD, 5 mg at 07/29/23 1230   haloperidol lactate (HALDOL) injection 5 mg, 5 mg, Intramuscular, Once, Paliwal, Aditya, MD   heparin injection 5,000 Units, 5,000 Units, Subcutaneous, Q8H, Lidia Collum, [name], 5,000 Units at 07/30/23 2130   hydrALAZINE (APRESOLINE) injection 10-40 mg, 10-40 mg, Intravenous, Q4H PRN, Pia Mau D, [name]   hydrALAZINE (APRESOLINE) tablet 10 mg, 10 mg, Oral, Q8H, Autry, Lauren E, [name], 10 mg at 07/29/23 1806   HYDROmorphone (DILAUDID) injection 1 mg, 1 mg, Intravenous, Q4H PRN, Lorin Glass, MD, 1 mg at 07/29/23 1230   HYDROmorphone (DILAUDID) tablet 2 mg, 2 mg, Oral, Q6H, Lorin Glass, MD, 2 mg at 07/29/23 1806   ipratropium-albuterol (DUONEB) 0.5-2.5 (3) MG/3ML nebulizer solution 3 mL, 3 mL, Nebulization, Q4H PRN, Pia Mau D, [name]   LORazepam (ATIVAN) tablet 1 mg, 1 mg, Oral, Once PRN **OR** LORazepam (ATIVAN) injection 1 mg, 1 mg, Intravenous, Once PRN, Aniko Finnigan A   multivitamin with minerals tablet 1 tablet, 1 tablet, Oral, Daily, Deanna Artis, DO, 1 tablet at 07/30/23 8657   Oral care mouth rinse, 15 mL, Mouth Rinse, PRN, Kalman Shan, MD   pantoprazole (PROTONIX) EC tablet 40 mg, 40 mg, Oral, Daily, Autry, Lauren E, [name], 40 mg at 07/30/23 8469   polyethylene glycol (MIRALAX / GLYCOLAX) packet 17 g, 17 g, Oral, Daily PRN, Suzie Portela, John D, [name]   pregabalin (LYRICA) capsule 200 mg, 200 mg, Oral, BID, Steffanie Brule, DO, 200 mg at  07/30/23 6295   QUEtiapine (SEROQUEL) tablet 200 mg, 200 mg, Oral, QHS, Bolaji, Adepeju, NP, 200 mg at 07/29/23 2207   revefenacin (YUPELRI) nebulizer solution 175 mcg, 175 mcg, Nebulization, Daily, Lidia Collum, [name], 175 mcg at 07/30/23 0750   sodium bicarbonate tablet 650 mg, 650 mg, Oral, BID, Hunsucker, Lesia Sago, MD, 650 mg at 07/30/23 2841   tamsulosin [facility]) capsule 0.4 mg, 0.4 mg, Oral, Daily, Toni Amend W, DO, 0.4 mg at 07/30/23 3244   [COMPLETED] thiamine (VITAMIN B1) 500 mg in sodium chloride 0.9 % 50 mL IVPB, 500 mg, Intravenous, Q8H, Last Rate: 110 mL/hr at 07/21/23 0600, Infusion Verify at 07/21/23 0600 **FOLLOWED BY** thiamine (VITAMIN B1) tablet 100 mg, 100 mg, Oral, Daily, Lorin Glass, MD, 100 mg at 07/30/23 0102  Allergies: Allergies  Allergen Reactions   Latex Other (See Comments)    Unknown reaction    Claris Che A Dyson Sevey

## 2023-07-30 NOTE — Plan of Care (Signed)
Problem: Clinical Measurements: Goal: Ability to maintain clinical measurements within normal limits will improve Outcome: Progressing Goal: Will remain free from infection Outcome: Progressing Goal: Diagnostic test results will improve Outcome: Progressing Goal: Respiratory complications will improve Outcome: Progressing Goal: Cardiovascular complication will be avoided Outcome: Progressing   Problem: Nutrition: Goal: Adequate nutrition will be maintained Outcome: Progressing   Problem: Coping: Goal: Level of anxiety will decrease Outcome: Progressing   Problem: Elimination: Goal: Will not experience complications related to bowel motility Outcome: Progressing Goal: Will not experience complications related to urinary retention Outcome: Progressing   Problem: Pain Managment: Goal: General experience of comfort will improve and/or be controlled Outcome: Progressing   Problem: Safety: Goal: Ability to remain free from injury will improve Outcome: Progressing   Problem: Skin Integrity: Goal: Risk for impaired skin integrity will decrease Outcome: Progressing

## 2023-07-30 NOTE — Plan of Care (Signed)

## 2023-07-30 NOTE — Progress Notes (Signed)
Progress Note   Patient: Gregory [name] ZOX:[id number] DOB: 1961/01/23 DOA: 07/15/2023     14 DOS: the patient was seen and examined on 07/30/2023   Brief hospital course: 63 year old male with history of diabetes mellitus type 2, hypertension, hyperlipidemia, chronic kidney disease stage IIIb, COPD, chronic osteomyelitis status post right AKA, left midfoot amputation, unspecified stroke, A-fib, chronic diastolic heart failure, mesenteric ischemia status post colostomy and recent hospitalization from 06/27/2023 to 07/11/2023 for septic shock due to ESBL UTI and multifocal pneumonia with rhinovirus infection/acute respiratory failure with hypoxia requiring intubation and extubation/acute encephalopathy/acute ischemic stroke and subsequent discharge to SNF presented with severe agitation and delirium.  On presentation, CT of the head was negative for acute abnormalities.  Ammonia and VBG were unremarkable.  He was admitted to ICU and managed with Precedex.  He was transferred to [facility] service from 07/23/2023 onwards.   Assessment and Plan:  Acute metabolic encephalopathy/agitation and delirium -Initially admitted to ICU under PCCM service and treated with Precedex.  Ammonia, VBG and CT of the head were negative for acute intracranial abnormality. Care transferred to [facility] service from 07/23/2023 onwards.  At appear to resolved at this time. -Psychiatry following closely.  Working to reduce sedative type medications which may be contributing to patient's delirium. -became more lethargic 2/7.  Underlying etiology include dehydration, medication effect, urinary retention, or even insidious infection.  Yesterday was more alert but appeared more delirious, pulling at lines and removing his colostomy bag.  Was placed in restraints for short time while IV and colostomy bag were replaced.  Showing marked improvement the last couple of days, more alert, talkative.  However this morning appears more lethargic and confused  again.  Treatment as below.  Will continue to hold any sedative type medications including opioids/benzos.  Hypotension - On 2/7, systolic blood pressures in the 80s.  Patient appeared more lethargic.  Unclear if from hypovolemia or other etiology such as acute urinary retention.  IV fluid bolus given with excellent response, systolic blood pressures in the 120-130s now.  Appears resolved.  Possible Urinary Tract Infection -UA appears cloudy, large amount of leukocytes and nitrate positive with noted bacteria.  Currently on day 5 of 5 empiric ceftriaxone.  Leukocytosis resolved.  Could just be bacteriuria and colonization from dehydration.  UC showing NGTD.  Hypotension resolved.  Patient afebrile.  Concern for acute urinary retention - Etiology may be from UTI or vice versa.  Despite IV fluid bolus, creatinine creeping upward.  I/O x 1 resulted in ~367mL urine output.  Flomax initiated.  Urine output since seems adequate.  Leukocytosis resolved.  Acute hypoglycemia - Likely contributing to patient's intermittent encephalopathy and confusion this morning.  Initiated on 1 L of D5 half-normal.  Exacerbated by decreased p.o. intake secondary to delirium.  COPD -Stable.  Continue current nebulized regimen   Chronic systolic heart failure Hypertension Hyperlipidemia -Not appear to be in acute exacerbation.  Continue strict input and output.  Daily weights.  Fluid restriction.  Continue Coreg, hydralazine and statin.  Tolerated Lasix x 1.  Outpatient follow-up with cardiology   Diabetes mellitus type 2 -Blood sugars controlled   Acute kidney injury CKD stage IIIb -Showing improvement since yesterday.  Likely prerenal etiology.   Monitor urine output.  Will recheck BMP in a.m.   Hypomagnesemia -Showing improvement since replenishment.  Will recheck magnesium in a.m.   Chronic pain syndrome -Continue current regimen.   Depression/anxiety -Continue current regimen.  Psychiatry consulted.   Follow recommendations  Anemia of chronic disease -Possibly from chronic kidney disease.  Hemoglobin stable.  Monitor intermittently   Goals of care -Patient evaluated by palliative care.  Patient continues to be full code.  Currently unable to discuss goals of care.  Closely with case management and family on disposition planning.  Likely eventual transition back to [facility].  Delirium continues to be a barrier.   Generalized deconditioning -PT recommending SNF placement.  Consult TOC  Goals of care - Will complete empiric ceftriaxone today.  Encephalopathy appears to be improving the last 2 days however this morning appears slightly worse again..  Will begin to transition to SNF placement assuming delirium/encephalopathy resolves.      Subjective: Patient much less alert this morning.  Rousable and answers questions but very lethargic/obtunded.  Yesterday reportedly very agitated and delirious, requiring IM Ativan.  Patient was apparently pulling off his IVs and colostomy bag.  This morning much more lethargic and less conversant.  Denies any pain, shortness of breath.  Afebrile overnight.  Systolic blood pressure appears improved.  Output adequate.  Kos this morning noted to be 66 however.  Physical Exam: Vitals:   07/29/23 2357 07/30/23 0501 07/30/23 0750 07/30/23 0807  BP: 103/61 109/60  114/69  Pulse: (!) 51 (!) 52  (!) 57  Resp: 16 18  17   Temp: 97.8 F (36.6 C) 98.3 F (36.8 C)  98.4 F (36.9 C)  TempSrc:      SpO2: 96% 100% 98% 97%  Weight:  79.6 kg    Height:       GENERAL:  Alert, in no acute distress, disheveled, lethargic but rousable HEENT:  EOMI CARDIOVASCULAR: Regular and bradycardic, no murmurs appreciated RESPIRATORY:  Clear to auscultation, no wheezing, rales, or rhonchi GASTROINTESTINAL:  Soft, nontender, nondistended, colostomy in place EXTREMITIES: Right AKA NEURO:  No new focal deficits appreciated, lethargic but rousable SKIN:  No rashes  noted PSYCH:  Appropriate mood and affect   Data Reviewed:  There are no new results to review at this time.  Family Communication: None at bedside  Disposition: Status is: Inpatient Remains inpatient appropriate because: Continued IV medications and medication adjustment  Planned Discharge Destination: Skilled nursing facility    Time spent: 35 minutes  Author: Deanna Artis, DO 07/30/2023 11:34 AM  For on call review [email or web address].

## 2023-07-31 LAB — BASIC METABOLIC PANEL
Anion gap: 13 (ref 5–15)
BUN: 20 mg/dL (ref 8–23)
CO2: 21 mmol/L — ABNORMAL LOW (ref 22–32)
Calcium: 9.1 mg/dL (ref 8.9–10.3)
Chloride: 100 mmol/L (ref 98–111)
Creatinine, Ser: 1.7 mg/dL — ABNORMAL HIGH (ref 0.61–1.24)
GFR, Estimated: 44 mL/min — ABNORMAL LOW (ref >60)
Glucose, Bld: 112 mg/dL — ABNORMAL HIGH (ref 70–99)
Potassium: 4 mmol/L (ref 3.5–5.1)
Sodium: 134 mmol/L — ABNORMAL LOW (ref 135–145)

## 2023-07-31 LAB — GLUCOSE, CAPILLARY: Glucose-Capillary: 159 mg/dL — ABNORMAL HIGH (ref 70–99)

## 2023-07-31 LAB — CBC
HCT: 28.6 % — ABNORMAL LOW (ref 39.0–52.0)
Hemoglobin: 9.3 g/dL — ABNORMAL LOW (ref 13.0–17.0)
MCH: 29.8 pg (ref 26.0–34.0)
MCHC: 32.5 g/dL (ref 30.0–36.0)
MCV: 91.7 fL (ref 80.0–100.0)
Platelets: 111 10*3/uL — ABNORMAL LOW (ref 150–400)
RBC: 3.12 MIL/uL — ABNORMAL LOW (ref 4.22–5.81)
RDW: 15.3 % (ref 11.5–15.5)
WBC: 5.2 10*3/uL (ref 4.0–10.5)
nRBC: 0 % (ref 0.0–0.2)

## 2023-07-31 LAB — MAGNESIUM: Magnesium: 1.8 mg/dL (ref 1.7–2.4)

## 2023-07-31 MED ORDER — QUETIAPINE FUMARATE 100 MG PO TABS
100.0000 mg | ORAL_TABLET | Freq: Every day | ORAL | Status: DC
  Administered 2023-07-31: 100 mg via ORAL
  Filled 2023-07-31: qty 1

## 2023-07-31 MED ORDER — QUETIAPINE FUMARATE 25 MG PO TABS
50.0000 mg | ORAL_TABLET | Freq: Once | ORAL | Status: AC
  Administered 2023-07-31: 50 mg via ORAL
  Filled 2023-07-31: qty 2

## 2023-07-31 MED ORDER — HALOPERIDOL LACTATE 5 MG/ML IJ SOLN
5.0000 mg | Freq: Four times a day (QID) | INTRAMUSCULAR | Status: DC | PRN
  Administered 2023-07-31 – 2023-08-01 (×3): 5 mg via INTRAMUSCULAR
  Filled 2023-07-31 (×3): qty 1

## 2023-07-31 MED ORDER — OLANZAPINE 10 MG IM SOLR
5.0000 mg | Freq: Once | INTRAMUSCULAR | Status: AC
  Administered 2023-07-31: 5 mg via INTRAMUSCULAR
  Filled 2023-07-31: qty 10

## 2023-07-31 NOTE — Consult Note (Signed)
Brief Psychiatry Consult Note  The patient was last seen by the psychiatry service on 2/11. Interim documentation by primary team and nursing staff has been reviewed. At this time (seen around 11 AM), patient is asleep - got haldol for some undocumented reason around 2 AM and 1 PM today. Hallucinating per OT note (family members). Will continue to folow   - no changes to baseline meds - might need to change from seroquel --> risperdal or haldol at next full assessment  - tried to call daughter again today w no response   Gregory [name]

## 2023-07-31 NOTE — Progress Notes (Signed)
Progress Note   Patient: Gregory [name] [id number]:[id number] DOB: Feb 24, 1961 DOA: 07/15/2023     15 DOS: the patient was seen and examined on 07/31/2023   Brief hospital course: 63 year old male with history of diabetes mellitus type 2, hypertension, hyperlipidemia, chronic kidney disease stage IIIb, COPD, chronic osteomyelitis status post right AKA, left midfoot amputation, unspecified stroke, A-fib, chronic diastolic heart failure, mesenteric ischemia status post colostomy and recent hospitalization from 06/27/2023 to 07/11/2023 for septic shock due to ESBL UTI and multifocal pneumonia with rhinovirus infection/acute respiratory failure with hypoxia requiring intubation and extubation/acute encephalopathy/acute ischemic stroke and subsequent discharge to SNF presented with severe agitation and delirium.  On presentation, CT of the head was negative for acute abnormalities.  Ammonia and VBG were unremarkable.  He was admitted to ICU and managed with Precedex.  He was transferred to [facility] service from 07/23/2023 onwards.   Assessment and Plan:  Acute metabolic encephalopathy/agitation and delirium -Initially admitted to ICU as he required Precedex.  Ammonia, VBG and CT of the head were negative for acute intracranial abnormality. Care transferred to [facility] service from 07/23/2023 onwards.   -Psychiatry following closely.  Working to reduce sedative type medications which may be contributing to patient's delirium. -Continues to have delirium/agitation despite multiple antipsychotics -Continue seroquel for now with PRN haldol, will add AM dose of seroquel given patient is pulling at medical equipment -Continue thiamine   Hypotension Resolved  - On 2/7, systolic blood pressures in the 80s.  Patient appeared more lethargic.  Unclear if from hypovolemia or other etiology such as acute urinary retention.  IV fluid bolus given with excellent response, systolic blood pressures in the 120-130s now.  Appears  resolved.  Possible Urinary Tract Infection -UA appears cloudy, large amount of leukocytes and nitrate positive with noted bacteria.  UC NGTD> s/p 5 d course of ceftriaxone.   Concern for acute urinary retention - Despite IV fluid bolus, creatinine creeping upward.  I/O x 1 resulted in ~357mL urine output.  Flomax initiated.  Continue with bladder scans every shift   Acute hypoglycemia Resolved  -D51/2NS completed 2/11 PM. Patient with poor intake in the setting of his agitation and confusion.  --Hold on further IV fluids for now, will discuss with patients family   COPD not in acute exacerbation  -Stable.  Continue current nebulized regimen   HFpEF  Hypertension Hyperlipidemia -Appears compensated.  Continue strict input and output.  Daily weights.  Fluid restriction.  Continue Coreg, hydralazine and statin.      Diabetes mellitus type 2 -Well controlled. Has had some low blood sugars in the setting of poor oral intake. Will hold insulin therapy at this time as well as his home medications.    Acute kidney injury CKD stage IIIb -Unclear baseline, continues to improve.  Will continue to monitor.  Avoid nephrotoxic medicaitons.   Hypomagnesemia Resolved  -Showing improvement since replenishment.  Will recheck magnesium in a.m.   Chronic pain syndrome -Continue current regimen. ON scheduled APAP and    Depression/anxiety -Continue current regimen.  Psychiatry consulted.    Anemia of chronic disease -Possibly from chronic kidney disease.  Hemoglobin stable.  Monitor intermittently   Thrombocytopenia  111k, no evidence of bleeding.  Continue to monitor  Goals of care -Patient evaluated by palliative care.  Patient continues to be full code.  Will need to discuss with patient's family again if he continues to have prolonged recovery. Currently unable to discuss goals of care.  Likely eventual transition back to [facility].  Delirium continues to be a barrier.    Generalized deconditioning -PT recommending SNF placement.  Consult TOC      Subjective: Patient initially resting comfortably. Became increasingly more agitated this AM. He received an additional dose of seroquel 50mg  and a dose of IV haldol to minimal effect. Patient was pulling off his mitts, pulled his IV out, he was also pulling at his ostomy bag. An antipsychotic was added due to his persistent agitation.   Physical Exam: Vitals:   07/31/23 0949 07/31/23 0959 07/31/23 1322 07/31/23 1323  BP: 110/74 110/74 131/89 131/89  Pulse: (!) 48 (!) 48 97   Resp:   20   Temp:      TempSrc:      SpO2: 98%  100%   Weight:      Height:       Physical Exam  Constitutional: In no distress., appears agitated.  Disheveled.  Cardiovascular: Normal rate, regular rhythm. No lower extremity edema  Pulmonary: Non labored breathing on room air, no wheezing or rales.  Abdominal: Soft. Normal bowel sounds. Non distended and non tender Extremities: S/p R AKA, L foot with ulcerations of distal aspect of 2nd and 3rd digits. No purulent drainage  Neurological: Alert and oriented to person only.  Skin: Skin is warm and dry.   Data Reviewed:  There are no new results to review at this time.  Family Communication: None at bedside  Disposition: Status is: Inpatient Remains inpatient appropriate because: Continued IV medications and medication adjustment  Planned Discharge Destination: Skilled nursing facility    Time spent: 35 minutes  Author: Marolyn Haller, MD 07/31/2023 5:03 PM  For on call review [email or web address].

## 2023-07-31 NOTE — Progress Notes (Signed)
VAST consult received to obtain IV access. Pt is confused and has pulled out 8 IVs. VAST RN suggested unit RN to reach out to provider and have Haldol be given IM instead of IV; unit RN in agreement. Instructed unit RN to place new IV team consult if circumstances change and patient needs IV access.

## 2023-07-31 NOTE — Plan of Care (Signed)
Problem: Safety: Goal: Non-violent Restraint(s) Outcome: Not Progressing   Problem: Education: Goal: Knowledge of General Education information will improve Description: Including pain rating scale, medication(s)/side effects and non-pharmacologic comfort measures Outcome: Not Progressing   Problem: Health Behavior/Discharge Planning: Goal: Ability to manage health-related needs will improve Outcome: Not Progressing   Problem: Clinical Measurements: Goal: Ability to maintain clinical measurements within normal limits will improve Outcome: Not Progressing Goal: Will remain free from infection Outcome: Not Progressing Goal: Diagnostic test results will improve Outcome: Not Progressing Goal: Respiratory complications will improve Outcome: Not Progressing Goal: Cardiovascular complication will be avoided Outcome: Not Progressing   Problem: Activity: Goal: Risk for activity intolerance will decrease Outcome: Not Progressing   Problem: Nutrition: Goal: Adequate nutrition will be maintained Outcome: Not Progressing   Problem: Coping: Goal: Level of anxiety will decrease Outcome: Not Progressing   Problem: Elimination: Goal: Will not experience complications related to bowel motility Outcome: Not Progressing Goal: Will not experience complications related to urinary retention Outcome: Not Progressing   Problem: Pain Managment: Goal: General experience of comfort will improve and/or be controlled Outcome: Not Progressing   Problem: Safety: Goal: Ability to remain free from injury will improve Outcome: Not Progressing   Problem: Skin Integrity: Goal: Risk for impaired skin integrity will decrease Outcome: Not Progressing

## 2023-08-01 DIAGNOSIS — R4182 Altered mental status, unspecified: Secondary | ICD-10-CM | POA: Diagnosis not present

## 2023-08-01 DIAGNOSIS — G9340 Encephalopathy, unspecified: Secondary | ICD-10-CM | POA: Diagnosis not present

## 2023-08-01 LAB — CBC
HCT: 33.3 % — ABNORMAL LOW (ref 39.0–52.0)
Hemoglobin: 10.5 g/dL — ABNORMAL LOW (ref 13.0–17.0)
MCH: 28.8 pg (ref 26.0–34.0)
MCHC: 31.5 g/dL (ref 30.0–36.0)
MCV: 91.5 fL (ref 80.0–100.0)
Platelets: 124 10*3/uL — ABNORMAL LOW (ref 150–400)
RBC: 3.64 MIL/uL — ABNORMAL LOW (ref 4.22–5.81)
RDW: 15.2 % (ref 11.5–15.5)
WBC: 5.9 10*3/uL (ref 4.0–10.5)
nRBC: 0 % (ref 0.0–0.2)

## 2023-08-01 LAB — FERRITIN: Ferritin: 964 ng/mL — ABNORMAL HIGH (ref 24–336)

## 2023-08-01 LAB — BASIC METABOLIC PANEL
Anion gap: 11 (ref 5–15)
BUN: 17 mg/dL (ref 8–23)
CO2: 20 mmol/L — ABNORMAL LOW (ref 22–32)
Calcium: 9.2 mg/dL (ref 8.9–10.3)
Chloride: 102 mmol/L (ref 98–111)
Creatinine, Ser: 1.61 mg/dL — ABNORMAL HIGH (ref 0.61–1.24)
GFR, Estimated: 47 mL/min — ABNORMAL LOW (ref >60)
Glucose, Bld: 77 mg/dL (ref 70–99)
Potassium: 4.3 mmol/L (ref 3.5–5.1)
Sodium: 133 mmol/L — ABNORMAL LOW (ref 135–145)

## 2023-08-01 LAB — AMMONIA: Ammonia: 46 umol/L — ABNORMAL HIGH (ref 9–35)

## 2023-08-01 LAB — IRON AND TIBC
Iron: 64 ug/dL (ref 45–182)
Saturation Ratios: 23 % (ref 17.9–39.5)
TIBC: 283 ug/dL (ref 250–450)
UIBC: 219 ug/dL

## 2023-08-01 LAB — VITAMIN B12: Vitamin B-12: 1135 pg/mL — ABNORMAL HIGH (ref 180–914)

## 2023-08-01 LAB — MAGNESIUM: Magnesium: 1.7 mg/dL (ref 1.7–2.4)

## 2023-08-01 LAB — PHOSPHORUS: Phosphorus: 3.4 mg/dL (ref 2.5–4.6)

## 2023-08-01 MED ORDER — HYDROMORPHONE HCL 2 MG PO TABS
4.0000 mg | ORAL_TABLET | Freq: Once | ORAL | Status: AC
  Administered 2023-08-01: 4 mg via ORAL
  Filled 2023-08-01: qty 2

## 2023-08-01 MED ORDER — QUETIAPINE FUMARATE 25 MG PO TABS
75.0000 mg | ORAL_TABLET | Freq: Every day | ORAL | Status: DC
  Administered 2023-08-01: 75 mg via ORAL
  Filled 2023-08-01: qty 3

## 2023-08-01 MED ORDER — LACTULOSE ENEMA: 300.0000 mL | Freq: Every day | ORAL | Status: DC

## 2023-08-01 MED ORDER — OLANZAPINE 5 MG PO TBDP
5.0000 mg | ORAL_TABLET | Freq: Four times a day (QID) | ORAL | Status: DC | PRN
  Administered 2023-08-02 – 2023-08-07 (×8): 5 mg via ORAL
  Filled 2023-08-01 (×8): qty 1

## 2023-08-01 MED ORDER — MELATONIN 3 MG PO TABS
3.0000 mg | ORAL_TABLET | Freq: Every day | ORAL | Status: DC
  Administered 2023-08-01 – 2023-08-09 (×8): 3 mg via ORAL
  Filled 2023-08-01 (×8): qty 1

## 2023-08-01 MED ORDER — PREGABALIN 25 MG PO CAPS
50.0000 mg | ORAL_CAPSULE | Freq: Two times a day (BID) | ORAL | Status: DC
  Administered 2023-08-01 – 2023-08-13 (×25): 50 mg via ORAL
  Filled 2023-08-01 (×26): qty 2

## 2023-08-01 MED ORDER — OLANZAPINE 5 MG PO TBDP
10.0000 mg | ORAL_TABLET | Freq: Every day | ORAL | Status: DC
  Administered 2023-08-01: 10 mg via ORAL
  Filled 2023-08-01: qty 2

## 2023-08-01 MED ORDER — DIVALPROEX SODIUM ER 500 MG PO TB24
500.0000 mg | ORAL_TABLET | Freq: Every day | ORAL | Status: DC
  Administered 2023-08-01: 500 mg via ORAL
  Filled 2023-08-01: qty 1

## 2023-08-01 MED ORDER — OLANZAPINE 10 MG IM SOLR
5.0000 mg | Freq: Four times a day (QID) | INTRAMUSCULAR | Status: DC | PRN
  Administered 2023-08-01 – 2023-08-05 (×3): 5 mg via INTRAMUSCULAR
  Filled 2023-08-01 (×7): qty 10

## 2023-08-01 MED ORDER — SENNA 8.6 MG PO TABS
1.0000 | ORAL_TABLET | Freq: Every day | ORAL | Status: DC
Start: 2023-08-01 — End: 2023-08-19
  Administered 2023-08-01 – 2023-08-18 (×12): 8.6 mg via ORAL
  Filled 2023-08-01 (×15): qty 1

## 2023-08-01 MED ORDER — QUETIAPINE FUMARATE 100 MG PO TABS: 100.0000 mg | ORAL_TABLET | Freq: Every day | ORAL | Status: DC

## 2023-08-01 MED ORDER — HYDROMORPHONE HCL 2 MG PO TABS
3.0000 mg | ORAL_TABLET | Freq: Four times a day (QID) | ORAL | Status: DC
  Administered 2023-08-01 – 2023-08-14 (×42): 3 mg via ORAL
  Filled 2023-08-01 (×43): qty 2

## 2023-08-01 NOTE — Plan of Care (Signed)
Problem: Safety: Goal: Non-violent Restraint(s) Outcome: Progressing   Problem: Education: Goal: Knowledge of General Education information will improve Description: Including pain rating scale, medication(s)/side effects and non-pharmacologic comfort measures Outcome: Progressing   Problem: Health Behavior/Discharge Planning: Goal: Ability to manage health-related needs will improve Outcome: Progressing   Problem: Clinical Measurements: Goal: Ability to maintain clinical measurements within normal limits will improve Outcome: Progressing Goal: Will remain free from infection Outcome: Progressing Goal: Diagnostic test results will improve Outcome: Progressing Goal: Respiratory complications will improve Outcome: Progressing Goal: Cardiovascular complication will be avoided Outcome: Progressing

## 2023-08-01 NOTE — Progress Notes (Signed)
Palliative Medicine Progress Note   Patient Name: Gregory [name]       Date: 08/01/2023 DOB: October 10, 1960  Age: 63 y.o. MRN#: [id number] Attending Physician: Marolyn Haller, MD Primary Care Physician: [name], Provider, MD Admit Date: 07/15/2023   HPI/Patient Profile: 63 y.o. 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 He was admitted to ICU and managed with Precedex.    He was hospitalized 06/27/2023 to 07/11/2023 for septic shock due to ESBL UTI and multifocal pneumonia with rhinovirus infection/acute respiratory failure with hypoxia requiring intubation and extubation/acute encephalopathy/acute ischemic stroke. Palliative care saw during the January 2025 hospitalization.  Subjective: Chart reviewed including vital signs, labs, and progress notes.   Assessed patient at bedside. He is oriented to self, hospital, and year. Tells me he is in the hospital "because he's sick" but quickly becomes irritated with further questions. Bilateral mittens in place - patient asks me to remove them.   I spoke with his daughter/Gregory [name] by phone. We discussed patient's ongoing delirium. She is concerned that patient's delirium will not improve while he is in the hospital, and that there is not a plan for next steps. She is tearful as she expresses it is difficult to see her father in his current condition. Emotional support provided. Offered to facilitate a meeting with myself and attending MD tomorrow - daughter agrees this would be helpful.    Objective:  Physical  Exam Vitals reviewed.  Constitutional:      General: He is not in acute distress.    Appearance: He is ill-appearing.  Pulmonary:     Effort: Pulmonary effort is normal.  Neurological:     Mental Status: He is alert.     Comments: Oriented to self and hospital             Palliative Medicine Assessment & Plan   Assessment: Principal Problem:   Acute encephalopathy Active Problems:   Malnutrition of moderate degree    Recommendations/Plan: Continue full scope  Time for outcomes Tentative plan for meeting tomorrow with daughter, PMT, and attending MD   Code Status: Full code  Prognosis:  Unable to determine  Discharge Planning: To Be Determined   Thank you for allowing the Palliative Medicine Team to assist in the care of this patient.   Time: 30 minutes   Merry Proud, NP   Please contact Palliative Medicine Team phone at [phone] for questions and concerns.  For individual providers, please see AMION.

## 2023-08-01 NOTE — Progress Notes (Signed)
Palliative Medicine Progress Note   Patient Name: Gregory [name]       Date: 08/01/2023 DOB: [birth year]  Age: 63 y.o. MRN#: [id number] Attending Physician: Marolyn Haller, MD Primary Care Physician: [name], Provider, MD Admit Date: 07/15/2023   HPI/Patient Profile: 63 y.o. 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 He was admitted to ICU and managed with Precedex.    He was hospitalized 06/27/2023 to 07/11/2023 for septic shock due to ESBL UTI and multifocal pneumonia with rhinovirus infection/acute respiratory failure with hypoxia requiring intubation and extubation/acute encephalopathy/acute ischemic stroke. Palliative care saw during the January 2025 hospitalization.  Subjective: Patient remains confused. Bilateral mittens in place.   Today's Discussion: This NP along with Dr. Montez Morita met with daughter/Gregory [name] We reviewed patient's hospital course and current clinical condition.   Gregory [name] is an [employer] fund[employer] and is very familiar with her father's complex medical history and multiple hospitalizations.   She reports that patient has resided at [facility] since June 2017. At baseline, he is able to transfer himself from bed to wheelchair. He is able to wheel himself around the facility, and will go out to the courtyard to smoke.  Despite his multiple comorbidities and functional limitations, Gregory [name] feels that her father has accepted his current condition and has a decent/acceptable quality of life at baseline.   Gregory [name] shares that patient has recovered from  serious/acute illness on multiple occasions. She is very aware that there will come a time that he is not able to recover.   However for now, she wants him to have the opportunity for improvement as per his previously stated wishes.   We had an open and honest discussion that delirium is associated with poor clinical outcomes. Gregory [name] understands this. She is hopeful that his delirium will improve once discharged back to [facility], and that is his familiar environment.  She is interested in obtaining a private sitter to sit with him a few hours a day at the facility.   Objective:  Physical Exam Vitals reviewed.  Constitutional:      General: He is not in acute distress.  Appearance: He is ill-appearing.  Pulmonary:     Effort: Pulmonary effort is normal.  Musculoskeletal:     Right Lower Extremity: Right leg is amputated above knee.  Neurological:     Mental Status: He is confused.              Palliative Medicine Assessment & Plan   Assessment: Principal Problem:   Acute encephalopathy Active Problems:   Malnutrition of moderate degree    Recommendations/Plan: Continue full scope care Daughter wants patient discharged back to [facility] in a few days, with the hope his delirium will improve once back in his familiar environment - I agree this is reasonable Daughter interested in obtaining a private sitter for patient at [facility] - discussed with [facility] Outpatient palliative PMT will continue to follow  Code Status: Full code   Prognosis:  Unable to determine    Thank you for allowing the Palliative Medicine Team to assist in the care of this patient.   Time: 65 minutes  Detailed review of medical records (labs, imaging, vital signs), medically appropriate exam, discussed with treatment team, counseling and education to patient, family, & staff, documenting clinical information, coordination of care.    Merry Proud, NP   Please contact Palliative  Medicine Team phone at [phone] for questions and concerns.  For individual providers, please see AMION.

## 2023-08-01 NOTE — Consult Note (Addendum)
[facility] Psychiatric Consult Follow-up  Patient Name: .Gregory [name]  MRN: [id number]  DOB: [birth year]  Consult Order details: [place]/severe agitation.   Mode of Visit: In person   Psychiatry Consult Evaluation  Service Date: August 01, 2023 LOS:  LOS: 16 days  Chief Complaint "I'm in pain everywhere"  Primary Psychiatric Diagnoses  Hyperactive delirium  2.  Somatic symptom disorder with predominant pain 3. History of MDD   Assessment  Gregory [name] is a 63 y.o. male admitted: Medicallyfor 07/15/2023  3:05 PM for acute agitated encephalopathy. 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   His current presentation of inattention, waxing and waning mental status, change over the last couple of days is most consistent with hyperactive delirium likely in the setting of multiple medications and uncontrolled pain. There is suspicion for somatic symptom disorder with predominant pain given chart review notable for multiple outpatient visits to chronic pain physicians and continued use of oxy throughout the years though notably nothing on PDMP. Main psychiatric diagnosis over the years has been depression/anxiety and patient and collateral denies any past psychiatric hospitalizations or suicide attempts. He reports depression but is unable to state what medications he has been on and whether or not they have helped. Current outpatient psychotropic medications from last admission included scheduled lexapro 20, oxy 15 TID, lyrica 25 TID, 100 BID, seroquel 100 at bedtime, PRN haldol 5 Q6H, oxy 5 Q6H and historically he was fully alert and mostly oriented on these medications. He was likely compliant with medications prior to admission since he was at the nursing home. On initial examination,  patient is alert, irritable, is not able to participate in sustained attention testing and is reporting pain. Will recommend decreasing seroquel, stopping klonopin, continuing depakote, making sure his pain is well controlled. I suspect that he has a low frustration tolerance with regards to his pain which contributes to agitation - was put on several anticholinergic or otherwise deliriogenic meds when in hyperactive delirium - finding right balance has been major challenge of this admission. Globally alertness and participation in PT has improved w/ reduction of medications with setback w/ recent UTI.   On 2/13, patient appears improved but still confused. Still not sleeping well at night and attempting to get out of wrist restraints. Is able to state why in the hospital but can't participate in formal attention testing, has no recall of interfering with medical treatment. Given ineffectiveness of seroquel and haldol, plan to change to olanzapine at bedtime and melatonin to try to improve his sleep. Discussed with daughter. Will also obtain repeat ammonia and EKG given patient received haldol 15mg . Will decrease depakote to 500mg  ER at bedtime to reduce polypharmacy and due to thrombocytopenia. Unclear if depakote is contributing to this given low depakote level, no ammonia but will monitor ammonia and decrease depakote as a precaution.   Diagnoses:  Active Hospital problems: Principal Problem:   Acute encephalopathy Active Problems:   Malnutrition of moderate degree    Plan  ## Psychiatric Medication Recommendations:  --stop seroquel 100 QHS for mood stabilization and insomnia given ineffectiveness, concern for polypharmacy --start zyprexa 10mg  at bedtime for insomnia, mood stabilization, hallucinations --start melatonin 3mg  at bedtime for insomnia, promote regular sleep-wake cycle --continue lexapro 20 daily for depression  --stop klonopin  1 BID for agitation  --decrease depakote 500 ER at  bedtime for mood stabilization  --optimize pain regimen  -- stop PRN haldol for agitation given ineffective, start PRN zyprexa 5mg  PO or IM q6H for severe agitation, would not give PRN ativan within 2 hours of administration due to risk of respiratory depression  -- OTO PRN ativan  ## Medical Decision Making Capacity: Not specifically addressed in this encounter  ## Further Work-up:  - VPA level 41 (albumin low 2.5 most recently) - will hold on further increases given clinical effect d/t low albumin.  --f/u ammonia level - 14 on 2/8, repeat 2/13 pending  -- most recent EKG on 2/13 had Qtc of 422 -- Pertinent labwork reviewed earlier this admission includes: CMP (Cr 1.52), CBC, TSH, UDS  ## Disposition:-- There are no psychiatric contraindications to discharge at this time  ## Behavioral / Environmental: -Delirium Precautions: Delirium Interventions for Nursing and Staff: - RN to open blinds every AM. - To Bedside: Glasses, hearing aide, and pt's own shoes. Make available to patients. when possible and encourage use. - Encourage po fluids when appropriate, keep fluids within reach. - OOB to chair with meals. - Passive ROM exercises to all extremities with AM & PM care. - RN to assess orientation to person, time and place QAM and PRN. - Recommend extended visitation hours with familiar family/friends as feasible. - Staff to minimize disturbances at night. Turn off television when pt asleep or when not in use.   ## Safety and Observation Level:  - Based on my clinical evaluation, I estimate the patient to be at moderate risk of self harm in the current setting due to agitation. - At this time, we recommend  safety observation. This decision is based on my review of the chart including patient's history and current presentation, interview of the patient, mental status examination, and consideration of suicide risk including evaluating suicidal ideation, plan, intent, suicidal or self-harm behaviors,  risk factors, and protective factors. This judgment is based on our ability to directly address suicide risk, implement suicide prevention strategies, and develop a safety plan while the patient is in the clinical setting. Please contact our team if there is a concern that risk level has changed.  CSSR Risk Category:C-SSRS RISK CATEGORY: No Risk  Suicide Risk Assessment: Patient has following modifiable risk factors for suicide: recklessness, which we are addressing by optimizing his medications Patient has following non-modifiable or demographic risk factors for suicide: male gender Patient has the following protective factors against suicide: Supportive family, no history of suicide attempts, and no history of NSSIB  Thank you for this consult request. Recommendations have been communicated to the primary team.  We will continue to follow at this time.   Karie Fetch, MD, PGY-2       History of Present Illness  Relevant Aspects of [facility] Course:  Admitted on 07/15/2023 for acute agitated encephalopathy.  -Required precedex  -Acute encephalopathy thought to be sepsis related ?AKI vs CKD  -Received 7 day course of CTX for possible UTI  ON events:  -patient confused and pulled out 8 IV's, haldol changed to IM  BP 151/121 -1x dose of seroquel added in AM due to persistent agitation -Received haldol 5 IM @ 2am, received total 15mg  yesterday -Nurse at bedside reports haldol was ineffective yesterday. Reports patient did not sleep at night.  Patient Report:  Patient seen in the AM being fed by nurse. He has increased alertness. Is able to state why  in hospital "confused" as well as year and location. Is not oriented to month. Is not able to participate in formal attention testing. Does not recall interfering with medical treatment. Discussed importance of maintaining regular sleep-wake cycle. Will start melatonin to help with this. Discussed will talk with his daughter and he  agrees.   Psych ROS:  Depression: reports always had depression Anxiety: reports anxiety  Mania (lifetime and current): denies  Psychosis: (lifetime and current): none  Collateral information:  Called daughter Jennefer Bravo 2/12:  She reports patient is still not back to baseline, still appears confused. Reports he also mentioned seeing her son at the bedside but he is not there. He asks her if he's going crazy. Discussed changing seroquel to zyprexa to see if that will help with sleep. She reports he slept later even prior to all his medical issues. She also asks about thrombocytopenia and depakote.   Contacted daughter [name] at [phone] on 2/5 She reports when he was discharged from last hospitalization, he was confused but pleasant and calm. Was with him when he was admitted to hospital. States he is not himself. States his agitation beforehand is if they were taking a long time to get his medication. He was more confused and didn't understand situation. Called her this morning and was with him last night, called her at 9pm. He was asking if she was coming to pick him up. He seemed confused about where he was last night. He seems a little more confused. Prior to admission, he was falling on the floor, trying to pee everywhere. Was not realizing where he was. On Monday nursing home told her he kept on falling in the floor. He didn't call her as he usually does. Still occasionally staring off when she was with him in the hospital. Trying to feed himself but weak. Held his pain medication. She reports she felt like he was a zombie yesterday. Sometimes need xanax when anxious. Yesterday he was telling her to clean up ice cream on floor. She thought perhaps he was bipolar. He can flip a switch really quickly, gets agitated easily. If he doesn't get what he wants he gets really mad, has always been like that. He's been a functional adult until his health declined. Struggles with anxiety and depression from his  health decline. Reports only psychotropic she is aware that he used to take was lexapro before all of his medical conditions.   ROS  Unable to obtain due to patient mental status  Psychiatric and Social History  Psychiatric History:  Information collected from patient, chart review  Prev Dx/Sx: MDD Current Psych Provider: none Home Meds (current): scheduled lexapro 20, oxy 15 TID, lyrica 25 TID, 100 BID, seroquel 100 at bedtime, PRN haldol 5 Q6H, oxy 5 Q6H Previous Med Trials: wellbutrin, cymbalta, gabapentin Therapy: unknown  Prior Psych Hospitalization: denies  Prior Self Harm: denies Prior Violence: denies  Family Psych History: denies, reports sister struggles with substance use  Family Hx suicide: unknown  Social History:  Living Situation: Lives at [facility] place for several years, no access to anything else  Access to weapons/lethal means: none   Substance History Denies substance use history, UDS+opiates, BZD. Patient required ativan, versed.  -In the past he used marijuana frequently, but then became dependent on pain meds   Exam Findings  Vital Signs:  Pulse Rate:  [48-97] 64 (02/12 1710) Resp:  [18-20] 18 (02/12 1707) BP: (110-151)/(74-121) 151/121 (02/12 1710) SpO2:  [95 %-100 %] 97 % (02/12  1707) Blood pressure (!) 151/121, pulse 64, temperature 98 F (36.7 C), temperature source Oral, resp. rate 18, height 6' (1.829 m), weight 79.3 kg, SpO2 97%. Body mass index is 23.72 kg/m.  Physical Exam Constitutional:      Appearance: He is obese.  HENT:     Head: Normocephalic.  Pulmonary:     Effort: Pulmonary effort is normal.  Neurological:     Mental Status: He is alert.    Mental Status Exam:  Orientation:  to person, globally situation/location   Memory:   unable to obtain  Concentration:  Concentration: Poor  Recall:  Poor  Attention  Poor  Eye Contact:  Poor  Speech:  soft, clear  Language:  Fair  Volume:  Normal  Mood: "Doing okay"  Affect:    pleasant   Thought Process:  unable to obtain   Thought Content:   unable to obtain  Suicidal Thoughts:  denied  Homicidal Thoughts:  denied  Judgement:  unable to obtain  Insight:  unable to obtain  Psychomotor Activity:  Decreased  Akathisia:  No  Fund of Knowledge:  Unable to obtain  Denied perceptual disturbances - likely intermittent VH  Assets: some ability to communicate needs  Cognition:  Impaired  ADL's:  Impaired  AIMS (if indicated):        Other History   These have been pulled in through the EMR, reviewed, and updated if appropriate.  Family History:  The patient's family history includes Bone cancer in his father; Cancer in his father, mother, paternal grandfather, and another family member; Diabetes in his mother and another family member; Heart disease in his mother; Prostate cancer in his father.  Medical History: 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    Chronic pain syndrome    COPD (chronic obstructive pulmonary disease) (HCC)    DDD (degenerative disc disease), lumbar    Depression    major depressive disorder    Diabetes mellitus without complication (HCC)    type 2    Diabetic ulcer of toe of left foot (HCC) 10/02/2016   Difficult intubation    Difficulty in walking, not elsewhere classified    Epidural abscess 10/02/2016   Foot drop, bilateral    Foot drop, right 12/27/2015   GERD (gastroesophageal reflux disease)    Herpesviral vesicular dermatitis     History of COVID-19 03/15/2022   Hyperlipidemia    Hyperlipidemia    Hypertension    Insomnia    Malignant melanoma of other parts of face (HCC)    Melanoma (HCC)    MRSA bacteremia    Muscle weakness (generalized)    Nasal congestion    Necrosis of toe (HCC) 07/08/2018   Neuromuscular dysfunction of bladder    Osteomyelitis (HCC)    Osteomyelitis (HCC)    Osteomyelitis (HCC)    right BKA   Other idiopathic peripheral autonomic  neuropathy    Retention of urine, unspecified    Squamous cell carcinoma of skin    Stroke (HCC)    Unsteadiness on feet    Urinary retention    Urinary retention    Urinary tract infection    Wears dentures    Wears glasses     Surgical History: Past Surgical History:  Procedure Laterality Date   AMPUTATION Left 03/29/2017   Procedure: LEFT GREAT TOE AMPUTATION AT METATARSOPHALANGEAL JOINT;  Surgeon: Nadara Mustard, MD;  Location: [facility] OR;  Service: Orthopedics;  Laterality: Left;   AMPUTATION Right 03/29/2017   Procedure: RIGHT BELOW KNEE AMPUTATION;  Surgeon: Nadara Mustard, MD;  Location: [facility] OR;  Service: Orthopedics;  Laterality: Right;   AMPUTATION Left 06/06/2018   Procedure: LEFT 2ND TOE AMPUTATION;  Surgeon: Nadara Mustard, MD;  Location: [facility] OR;  Service: Orthopedics;  Laterality: Left;   AMPUTATION Right 11/19/2018   Procedure: AMPUTATION ABOVE KNEE;  Surgeon: Nadara Mustard, MD;  Location: [facility] OR;  Service: Orthopedics;  Laterality: Right;   ANTERIOR CERVICAL CORPECTOMY N/A 11/25/2015   Procedure: ANTERIOR CERVICAL FIVE CORPECTOMY Cervical four - six fusion;  Surgeon: Lisbeth Renshaw, MD;  Location: [facility];  Service: Neurosurgery;  Laterality: N/A;  ANTERIOR CERVICAL FIVE CORPECTOMY Cervical four - six fusion   APPLICATION OF WOUND VAC Right 10/22/2018   Procedure: Application Of Wound Vac;  Surgeon: Nadara Mustard, MD;  Location: [facility] OR;  Service: Orthopedics;  Laterality: Right;   CYSTOSCOPY WITH BIOPSY N/A 05/01/2022   Procedure:  CYSTOSCOPY WITH URETHRAL BIOPSY;  Surgeon: Noel Christmas, MD;  Location: [facility] ORS;  Service: Urology;  Laterality: N/A;  1 HR   CYSTOSCOPY WITH RETROGRADE URETHROGRAM N/A 04/25/2018   Procedure: CYSTOSCOPY WITH RETROGRADE URETHROGRAM/ BALLOON DILATION;  Surgeon: Ihor Gully, MD;  Location: [facility] ORS;  Service: Urology;  Laterality: N/A;   CYSTOSCOPY WITH URETHRAL DILATATION N/A 05/01/2022   Procedure: BALLOON DILATION WITH  OPTILUME;  Surgeon: Noel Christmas, MD;  Location: [facility] ORS;  Service: Urology;  Laterality: N/A;   LAPAROTOMY N/A 12/03/2022   Procedure: EXPLORATORY LAPAROTOMY  total abdominal colectomy with end ileostomy;  Surgeon: Kinsinger, De Blanch, MD;  Location: [facility] OR;  Service: General;  Laterality: N/A;   MULTIPLE TOOTH EXTRACTIONS     POSTERIOR CERVICAL FUSION/FORAMINOTOMY N/A 11/29/2015   Procedure: Cervical Three-Cervical Seven Posterior Cervical Laminectomy with Fusion;  Surgeon: Lisbeth Renshaw, MD;  Location: [facility];  Service: Neurosurgery;  Laterality: N/A;  Cervical Three-Cervical Seven Posterior Cervical Laminectomy with Fusion   STUMP REVISION Right 10/22/2018   Procedure: REVISION RIGHT BELOW KNEE AMPUTATION;  Surgeon: Nadara Mustard, MD;  Location: [facility] OR;  Service: Orthopedics;  Laterality: Right;   STUMP REVISION Right 12/05/2018   Procedure: Revision Right Above Knee Amputation;  Surgeon: Nadara Mustard, MD;  Location: [facility] OR;  Service: Orthopedics;  Laterality: Right;   STUMP REVISION Right 05/29/2019   Procedure: REVISION RIGHT ABOVE KNEE AMPUTATION;  Surgeon: Nadara Mustard, MD;  Location: [facility] OR;  Service: Orthopedics;  Laterality: Right;   STUMP REVISION Right 06/24/2019   Procedure: STUMP REVISION;  Surgeon: Nadara Mustard, MD;  Location: [facility] OR;  Service: Orthopedics;  Laterality: Right;     Medications:   Current Facility-Administered Medications:    acetaminophen (TYLENOL) tablet 650 mg, 650 mg, Oral, Q6H, Lorin Glass, MD, 650 mg at 08/01/23 0504    arformoterol (BROVANA) nebulizer solution 15 mcg, 15 mcg, Nebulization, BID, Agarwala, Ravi,  MD, 15 mcg at 07/31/23 0906   aspirin EC tablet 81 mg, 81 mg, Oral, Daily, Hunsucker, Lesia Sago, MD, 81 mg at 07/30/23 1324   atorvastatin (LIPITOR) tablet 80 mg, 80 mg, Oral, QHS, Cristopher Peru, [name], 80 mg at 07/31/23 2034   budesonide (PULMICORT) nebulizer solution 0.5 mg, 0.5 mg, Nebulization, BID, Lidia Collum, [name], 0.5 mg at 07/31/23 4010   carvedilol (COREG) tablet 12.5 mg, 12.5 mg, Oral, BID WC, Autry, Lauren E, [name], 12.5 mg at 07/31/23 1710   Chlorhexidine Gluconate Cloth 2 % PADS 6 each, 6 each, Topical, Daily, Lorin Glass, MD, 6 each at 07/31/23 1330   divalproex (DEPAKOTE) DR tablet 500 mg, 500 mg, Oral, Q8H, Steffanie Cashaw, DO, 500 mg at 08/01/23 0503   docusate sodium (COLACE) capsule 100 mg, 100 mg, Oral, BID PRN, Lidia Collum, [name]   escitalopram (LEXAPRO) tablet 20 mg, 20 mg, Oral, Daily, Autry, Lauren E, [name], 20 mg at 07/30/23 2725   famotidine (PEPCID) tablet 20 mg, 20 mg, Oral, Daily, Lidia Collum, [name], 20 mg at 07/30/23 3664   feeding supplement (ENSURE ENLIVE / ENSURE PLUS) liquid 237 mL, 237 mL, Oral, BID BM, Hunsucker, Lesia Sago, MD, 237 mL at 07/30/23 4034   haloperidol lactate (HALDOL) injection 5 mg, 5 mg, Intramuscular, Once, Paliwal, Aditya, MD   haloperidol lactate (HALDOL) injection 5 mg, 5 mg, Intramuscular, Q6H PRN, Marolyn Haller, MD, 5 mg at 08/01/23 0215   heparin injection 5,000 Units, 5,000 Units, Subcutaneous, Q8H, Lidia Collum, [name], 5,000 Units at 08/01/23 7425   hydrALAZINE (APRESOLINE) injection 10-40 mg, 10-40 mg, Intravenous, Q4H PRN, Pia Mau D, [name]   hydrALAZINE (APRESOLINE) tablet 10 mg, 10 mg, Oral, Q8H, Autry, Lauren E, [name], 10 mg at 08/01/23 9563   HYDROmorphone (DILAUDID) injection 1 mg, 1 mg, Intravenous, Q4H PRN, Lorin Glass, MD, 1 mg at 07/30/23 2201   HYDROmorphone (DILAUDID) tablet 2 mg, 2 mg, Oral, Q6H, Lorin Glass, MD, 2  mg at 08/01/23 8756   ipratropium-albuterol (DUONEB) 0.5-2.5 (3) MG/3ML nebulizer solution 3 mL, 3 mL, Nebulization, Q4H PRN, Lidia Collum, [name]   multivitamin with minerals tablet 1 tablet, 1 tablet, Oral, Daily, Deanna Artis, DO, 1 tablet at 07/30/23 4332   Oral care mouth rinse, 15 mL, Mouth Rinse, PRN, Kalman Shan, MD   pantoprazole (PROTONIX) EC tablet 40 mg, 40 mg, Oral, Daily, Autry, Lauren E, [name], 40 mg at 07/30/23 9518   polyethylene glycol (MIRALAX / GLYCOLAX) packet 17 g, 17 g, Oral, Daily PRN, Suzie Portela, John D, [name]   pregabalin (LYRICA) capsule 100 mg, 100 mg, Oral, BID, Calkins, Derek W, DO, 100 mg at 07/31/23 2033   QUEtiapine (SEROQUEL) tablet 100 mg, 100 mg, Oral, QHS, Marolyn Haller, MD, 100 mg at 07/31/23 2033   revefenacin (YUPELRI) nebulizer solution 175 mcg, 175 mcg, Nebulization, Daily, Lidia Collum, [name], 175 mcg at 07/31/23 8416   sodium bicarbonate tablet 650 mg, 650 mg, Oral, BID, Hunsucker, Lesia Sago, MD, 650 mg at 07/31/23 2034   tamsulosin [facility]) capsule 0.4 mg, 0.4 mg, Oral, Daily, Toni Amend W, DO, 0.4 mg at 07/30/23 6063   [COMPLETED] thiamine (VITAMIN B1) 500 mg in sodium chloride 0.9 % 50 mL IVPB, 500 mg, Intravenous, Q8H, Last Rate: 110 mL/hr at 07/21/23 0600, Infusion Verify at 07/21/23 0600 **FOLLOWED BY** thiamine (VITAMIN B1) tablet 100 mg, 100 mg, Oral, Daily, Lorin Glass, MD, 100 mg at 07/30/23 0160  Allergies: Allergies  Allergen Reactions   Latex Other (See Comments)    Unknown reaction    Karie Fetch, MD, PGY-2

## 2023-08-01 NOTE — Progress Notes (Addendum)
Progress Note   Patient: Gregory [name] BJY:[id number] DOB: Mar 10, 1961 DOA: 07/15/2023     16 DOS: the patient was seen and examined on 08/01/2023   Brief hospital course: 63 year old male with history of diabetes mellitus type 2, hypertension, hyperlipidemia, chronic kidney disease stage IIIb, COPD, chronic osteomyelitis status post right AKA, left midfoot amputation, unspecified stroke, A-fib, chronic diastolic heart failure, mesenteric ischemia status post colostomy and recent hospitalization from 06/27/2023 to 07/11/2023 for septic shock due to ESBL UTI and multifocal pneumonia with rhinovirus infection/acute respiratory failure with hypoxia requiring intubation and extubation/acute encephalopathy/acute ischemic stroke and subsequent discharge to SNF presented with severe agitation and delirium.  On presentation, CT of the head was negative for acute abnormalities.  Ammonia and VBG were unremarkable.  He was admitted to ICU and managed with Precedex.  He was transferred to [name] service from 07/23/2023 onwards.   Assessment and Plan:  Persistent encephalopathy Hyperactive delirium -At baseline when discharged 07/11/2023.Patient was lucid, able to feed, and bathe himself. He lives at [name]. His daughter makes decisions for his care.   -Initially admitted to ICU as he required Precedex.  Ammonia, VBG with no abnormalities to explain patient's presentation.  CT of the head was negative for acute intracranial abnormality.   -Currently no obvious medical reasons to explain delirium. Has elevated ammonia level this AM, no known liver pathology. Possibly in setting of CKD.  -Continues to have delirium/agitation despite multiple antipsychotics -Psychiatry following closely.  Working to reduce sedating medications which may be contributing to patient's delirium. --Patient with elevated ammonia, however he has no colon. He had normal ammonia level on presentation and his renal dysfunction was worsened.  Perhaps mild elevation in ammonia due to antipyschotics.  -Regarding nutrition, patient is mostly agitated or sedated due to antipsychotics. Per daughter patient will eat when she is around though she is not able to visit regularly as she has two small children and is a Engineer, civil (consulting). Patient pulls IV out constantly and would likely not tolerate NGT. Family hopes that he can be transitioned back to [name] with a sitter and that his PO intake would improve. For now possible interventions are limited.   Plan:  -Patient transitioned to Zyprexa at night with as needed Zyprexa for agitation during the day as well as Depakote. -Patient with chronic pain primarily in right lower extremity.  On OxyContin 15 mg twice daily at [facility].  On Dilaudid 2 mg every 6 hours here.  Will increase his Dilaudid to 3 mg every 6 hours given home dose.  -Currently requiring wrist restraints as patient pulls IV and other medical equipment.  Continue CBG q6hours, encourage PO intake when awake.  -For ammonia, lactulose will be ineffective. His depakote was reduced. Will ensure patient is hydrated.  -Continue thiamine   Acute hypoglycemia Resolved  CBG (last 3)  Recent Labs    07/30/23 2009 07/31/23 2040  GLUCAP 139* 159*   Normal blood glucose on AM chemistry. Will continue CBG monitoring every 6 hours while awake.  Will hold on IV fluids for now. Per daughter patient will eat when she is around.   Hypotension Resolved  - On 2/7, systolic blood pressures in the 80s.    Possible Urinary Tract Infection UC NGTD> s/p 5 d course of ceftriaxone.  Does not complain of dysuria.   COPD not in acute exacerbation  -Stable.  Continue current nebulized regimen   HFpEF  Hypertension Hyperlipidemia -Appears compensated. Continue Coreg, hydralazine and statin.  Diabetes mellitus type 2 -Well controlled. Has had some low blood sugars in the setting of poor oral intake. Will hold insulin therapy at this time as well  as his home medications.    Acute kidney injury CKD stage IIIb -Unclear baseline, continues to improve.  Will continue to monitor.  Avoid nephrotoxic medicaitons.   Hypomagnesemia Resolved    Chronic pain syndrome -Continue current regimen. ON scheduled APAP and dilaudid. Increased HM given patient on oxycontin 15mg  BID at home.    Depression/anxiety -Continue current regimen. Per psychiatry     Anemia of chronic disease -Possibly from chronic kidney disease.  No iron deficiency. Vit B12 WNL.     Latest Ref Rng & Units 08/01/2023    8:24 AM 07/31/2023    6:37 AM 07/30/2023    6:16 AM  CBC  WBC 4.0 - 10.5 K/uL 5.9  5.2  5.2   Hemoglobin 13.0 - 17.0 g/dL 65.7  9.3  9.5   Hematocrit 39.0 - 52.0 % 33.3  28.6  29.7   Platelets 150 - 400 K/uL 124  111  123    Stable.    Thrombocytopenia  Stable. Possibly in the setting of medications.  Depakote reduced per psych.  -Continue to monitor.   Goals of care -Palliative care and medical team had discussion with patient's daughter this p.m.  She would like patient to remain full code at this time as this was his wishes prior to his confusion.  Patient's daughter feels that if the patient could be transition back to his place at [name], perhaps this could expedite his recovery. CM/SW working to get patient back to [name] with sitter at the facility.    Generalized deconditioning -PT recommending SNF placement.  Consult TOC      Subjective: Patient initially resting comfortably. Became increasingly more agitated this AM. He received an additional dose of seroquel 50mg  and a dose of IV haldol to minimal effect. Patient was pulling off his mitts, pulled his IV out, he was also pulling at his ostomy bag. An antipsychotic was added due to his persistent agitation.   Physical Exam: Vitals:   07/31/23 1707 07/31/23 1710 08/01/23 0754 08/01/23 0800  BP: (!) 151/121 (!) 151/121  (!) 146/96  Pulse: 64 64 70 60  Resp: 18  15 18   Temp:     (!) 97.3 F (36.3 C)  TempSrc:    Oral  SpO2: 97%  96% 95%  Weight:      Height:       Physical Exam  Constitutional: In no distress., appears agitated.  Disheveled.  Cardiovascular: Normal rate, regular rhythm. No lower extremity edema  Pulmonary: Non labored breathing on room air, no wheezing or rales.  Abdominal: Soft. Normal bowel sounds. Non distended and non tender Extremities: S/p R AKA, L foot with ulcerations of distal aspect of 2nd and 3rd digits. No purulent drainage  Neurological: Alert and oriented to person only.  Skin: Skin is warm and dry.   Data Reviewed:  There are no new results to review at this time.  Family Communication: None at bedside  Disposition: Status is: Inpatient Remains inpatient appropriate because: Continued IV medications and medication adjustment  Planned Discharge Destination: Skilled nursing facility    Time spent: 35 minutes  Author: Marolyn Haller, MD 08/01/2023 5:07 PM  For on call review [email or web address].

## 2023-08-02 DIAGNOSIS — F05 Delirium due to known physiological condition: Secondary | ICD-10-CM

## 2023-08-02 LAB — GLUCOSE, CAPILLARY: Glucose-Capillary: 94 mg/dL (ref 70–99)

## 2023-08-02 MED ORDER — OLANZAPINE 5 MG PO TBDP
15.0000 mg | ORAL_TABLET | Freq: Every day | ORAL | Status: DC
  Administered 2023-08-02 – 2023-08-13 (×12): 15 mg via ORAL
  Filled 2023-08-02 (×13): qty 3

## 2023-08-02 NOTE — Progress Notes (Addendum)
PROGRESS NOTE                                                                                                                                                                                                             Patient Demographics:    Gregory [name], is a 63 y.o. male, DOB - [birth year], ZOX:[id number]  Outpatient Primary MD for the patient is [name], Provider, MD    LOS - 17  Admit date - 07/15/2023    Chief Complaint  Patient presents with   Altered Mental Status       Brief Narrative (HPI from H&P)   63 year old male with history of diabetes mellitus type 2, hypertension, hyperlipidemia, chronic kidney disease stage IIIb, COPD, chronic osteomyelitis status post right AKA, left midfoot amputation, unspecified stroke, A-fib, chronic diastolic heart failure, mesenteric ischemia status post colostomy and recent hospitalization from 06/27/2023 to 07/11/2023 for septic shock due to ESBL UTI and multifocal pneumonia with rhinovirus infection/acute respiratory failure with hypoxia requiring intubation and extubation/acute encephalopathy/acute ischemic stroke and subsequent discharge to SNF presented with severe agitation and delirium.  On presentation, CT of the head was negative for acute abnormalities.  Ammonia and VBG were unremarkable.  He was admitted to ICU and managed with Precedex.  He was transferred to [facility] service from 07/23/2023 onwards. Palliative care and psychiatry following. Currently pending SNF placement.    Subjective:    Gregory [name] was evaluated at the bed side. He remains sleepy but opens his eyes slightly. He is able to follow commands to take a deep breath. Will give him a lab holiday today. Discussed disposition with CSW who states [name] still waiting for further improvement in his mental status before admitting.   Assessment  & Plan :    Assessment and Plan:  Persistent encephalopathy Hyperactive  delirium -At baseline when discharged 07/11/2023.Patient was lucid, able to feed, and bathe himself. He lives at [facility]. His daughter makes decisions for his care.    -Initially admitted to ICU as he required Precedex. Ammonia, VBG with no abnormalities to explain patient's presentation.  CT of the head was negative for acute intracranial abnormality.    -Currently no obvious medical reasons to explain delirium. Ammonia slightly elevated but likely not the cause of his delirium.  He remains sleepy this morning after receiving Seroquel last night. -  Continues to have delirium/agitation despite multiple antipsychotics -Psychiatry following closely. Working to reduce sedating medications which may be contributing to patient's delirium. --Patient with mildly elevated ammonia, likely secondary to antipsychotics. -Regarding nutrition, patient is mostly agitated or sedated due to antipsychotics. Per daughter patient will eat when she is around though she is not able to visit regularly as she has two small children and is a Engineer, civil (consulting). Patient pulls IV out constantly and would likely not tolerate NGT. Family hopes that he can be transitioned back to [facility] with a sitter and that his PO intake would improve. For now possible interventions are limited.    Plan:  -Psychiatry following, appreciate assistance -Increasing his zyprexa to 15 mg at night, keeping the PRN zyprexa and discontinuing the depakote.  -Patient with chronic pain primarily in right lower extremity.   -On OxyContin 15 mg twice daily at [facility], on Dilaudid to 3 mg every 6 hours here -Currently requiring wrist restraints as patient pulls IV and other medical equipment.  -Continue CBG q6hours, encourage PO intake when awake. Pending CBG this morning -Continue thiamine    Acute hypoglycemia Resolved  CBG (last 3)  Recent Labs    07/30/23 2009 07/31/23 2040  GLUCAP 139* 159*  CBG order in however no CBG checks since 8 PM on  2/12. -Continue CBG monitoring Q6H   Hypotension Resolved  BP remains stable with SBP in the 110s to 140s   Possible Urinary Tract Infection UC NGTD> s/p 5 d course of ceftriaxone. Does not complain of dysuria.   COPD not in acute exacerbation  -Stable. Continue current nebulized regimen   HFpEF  Hypertension Hyperlipidemia -Appears compensated. Continue Coreg, hydralazine and statin.     Diabetes mellitus type 2 -Well controlled. Has had some low blood sugars in the setting of poor oral intake.  No CBGs in more than 24 hours. -Discussed with RN about getting CBGs every 6 hours.   Acute kidney injury CKD stage IIIb -Unclear baseline, continues to improve.  -Lab holiday today -Avoid nephrotoxic medicaitons.    Chronic pain syndrome -Continue current regimen. ON scheduled APAP and IV dilaudid.    Depression/anxiety -Continue current regimen. Per psychiatry     Anemia of chronic disease -Possibly from chronic kidney disease. No iron deficiency. Vit B12 WNL.  -Hgb stable at 9-10.   -Follow-up CBC tomorrow   Thrombocytopenia  Stable. Possibly in the setting of medications. Depakote reduced per psych.  -Continue to monitor.    Goals of care -Palliative care and medical team had discussion with patient's daughter this p.m.  She would like patient to remain full code at this time as this was his wishes prior to his confusion.  Patient's daughter feels that if the patient could be transition back to his place at [place], perhaps this could expedite his recovery.  Daughter working on getting a Engineer, site for patient when he goes back to [facility]. Discussed with CSW about disposition and [name] waiting for some improvement in his mental status.   Generalized deconditioning -PT recommending SNF placement. Consult TOC      Nutrition Problem: Nutrition Problem: Moderate Malnutrition Etiology: chronic illness Signs/Symptoms: moderate fat depletion, moderate muscle  depletion Interventions: Ensure Enlive (each supplement provides 350kcal and 20 grams of protein), MVI  Obesity: Estimated body mass index is 23.72 kg/m as calculated from the following:   Height as of this encounter: 6' (1.829 m).   Weight as of this encounter: 79.3 kg.  Condition - stable  Family Communication  :  Discussed disposition with daughter on the phone.   Code Status :  Full  Consults  :  Psychiatry  PUD Prophylaxis : Protonix   Procedures  :     None      Disposition Plan  : Pending SNF placement  Status is: Inpatient Remains inpatient appropriate because: Pending safe disposition  DVT Prophylaxis  :    heparin injection 5,000 Units Start: 07/16/23 0600   Lab Results  Component Value Date   PLT 124 (L) 08/01/2023    Diet :  Diet Order             Diet regular Room service appropriate? Yes; Fluid consistency: Thin  Diet effective now                    Inpatient Medications  Scheduled Meds:  acetaminophen  650 mg Oral Q6H   arformoterol  15 mcg Nebulization BID   aspirin EC  81 mg Oral Daily   atorvastatin  80 mg Oral QHS   budesonide (PULMICORT) nebulizer solution  0.5 mg Nebulization BID   carvedilol  12.5 mg Oral BID WC   Chlorhexidine Gluconate Cloth  6 each Topical Daily   divalproex  500 mg Oral QHS   escitalopram  20 mg Oral Daily   famotidine  20 mg Oral Daily   feeding supplement  237 mL Oral BID BM   heparin  5,000 Units Subcutaneous Q8H   hydrALAZINE  10 mg Oral Q8H   HYDROmorphone  3 mg Oral Q6H   melatonin  3 mg Oral QHS   multivitamin with minerals  1 tablet Oral Daily   OLANZapine zydis  10 mg Oral QHS   pantoprazole  40 mg Oral Daily   pregabalin  50 mg Oral BID   revefenacin  175 mcg Nebulization Daily   senna  1 tablet Oral Daily   tamsulosin  0.4 mg Oral Daily   thiamine  100 mg Oral Daily   Continuous Infusions: PRN Meds:.hydrALAZINE, ipratropium-albuterol, OLANZapine zydis **OR** OLANZapine,  mouth rinse, polyethylene glycol  Antibiotics  :    Anti-infectives (From admission, onward)    Start     Dose/Rate Route Frequency Ordered Stop   07/26/23 1530  sulfamethoxazole-trimethoprim (BACTRIM DS) 800-160 MG per tablet 1 tablet  Status:  Discontinued        1 tablet Oral Every 12 hours 07/26/23 1439 07/26/23 1439   07/26/23 1500  cefTRIAXone (ROCEPHIN) 1 g in sodium chloride 0.9 % 100 mL IVPB        1 g 200 mL/hr over 30 Minutes Intravenous Every 24 hours 07/26/23 1440 07/31/23 1459   07/16/23 2200  vancomycin (VANCOREADY) IVPB 750 mg/150 mL  Status:  Discontinued        750 mg 150 mL/hr over 60 Minutes Intravenous Every 12 hours 07/16/23 0739 07/16/23 1243   07/16/23 2200  vancomycin (VANCOCIN) 750 mg in sodium chloride 0.9 % 250 mL IVPB  Status:  Discontinued       Note to Pharmacy: Indication: Sepsis   750 mg 265 mL/hr over 60 Minutes Intravenous Every 12 hours 07/16/23 1243 07/17/23 0821   07/16/23 0800  vancomycin (VANCOREADY) IVPB 1750 mg/350 mL        1,750 mg 175 mL/hr over 120 Minutes Intravenous  Once 07/16/23 0705 07/16/23 1137   07/16/23 0800  piperacillin-tazobactam (ZOSYN) IVPB 3.375 g  Status:  Discontinued  3.375 g 12.5 mL/hr over 240 Minutes Intravenous Every 8 hours 07/16/23 0705 07/17/23 0821         Objective:   Vitals:   08/01/23 1730 08/01/23 2026 08/02/23 0519 08/02/23 0800  BP: 132/77 (!) 116/58 (!) 140/79   Pulse: (!) 55 61 (!) 50 (!) 55  Resp: 19 18 18 17   Temp: 97.9 F (36.6 C) 98.7 F (37.1 C) 97.6 F (36.4 C)   TempSrc: Oral Oral Oral   SpO2: 97% 98% 100% 97%  Weight:      Height:        Wt Readings from Last 3 Encounters:  07/31/23 79.3 kg  07/11/23 86.4 kg  02/08/23 104.3 kg     Intake/Output Summary (Last 24 hours) at 08/02/2023 0809 Last data filed at 08/02/2023 0555 Gross per 24 hour  Intake 720 ml  Output 1500 ml  Net -780 ml     Physical Exam  General: Elderly man laying comfortably in bed with eyes  closed. No acute distress. HEENT: [place]/AT. Anicteric sclera CV: Bradycardic. Regular rhythm. Pulmonary: Lungs CTAB. No wheezing or rales. Abdominal: Soft, NT/ND. Normal bowel sounds. Extremities: S/p R AKA. Palpable L DP pulse Skin: Warm and dry Neuro: Somnolent. Opens eyes on command. Follows all commands. Moving all extremities.    RN pressure injury documentation: Pressure Injury 07/16/23 Sacrum Medial Stage 1 -  Intact skin with non-blanchable redness of a localized area usually over a bony prominence. non-blanchable redness to sacrum (Active)  07/16/23 0645  Location: Sacrum  Location Orientation: Medial  Staging: Stage 1 -  Intact skin with non-blanchable redness of a localized area usually over a bony prominence.  Wound Description (Comments): non-blanchable redness to sacrum  Present on Admission: Yes  Dressing Type Other (Comment) (UTA) 07/31/23 1000      Data Review:    Recent Labs  Lab 07/27/23 0720 07/29/23 0851 07/30/23 0616 07/31/23 0637 08/01/23 0824  WBC 13.5* 6.1 5.2 5.2 5.9  HGB 9.3* 9.8* 9.5* 9.3* 10.5*  HCT 29.3* 30.7* 29.7* 28.6* 33.3*  PLT 140* 119* 123* 111* 124*  MCV 93.0 91.1 91.7 91.7 91.5  MCH 29.5 29.1 29.3 29.8 28.8  MCHC 31.7 31.9 32.0 32.5 31.5  RDW 17.2* 15.8* 15.6* 15.3 15.2    Recent Labs  Lab 07/27/23 0720 07/29/23 0851 07/30/23 0616 07/31/23 0637 08/01/23 0758 08/01/23 1244  NA 136 135 137 134* 133*  --   K 5.0 4.3 4.6 4.0 4.3  --   CL 105 103 104 100 102  --   CO2 20* 22 22 21* 20*  --   ANIONGAP 11 10 11 13 11   --   GLUCOSE 74 77 66* 112* 77  --   BUN 26* 26* 24* 20 17  --   CREATININE 2.33* 2.03* 2.06* 1.70* 1.61*  --   AST  --  13*  --   --   --   --   ALT  --  13  --   --   --   --   ALKPHOS  --  75  --   --   --   --   BILITOT  --  0.9  --   --   --   --   ALBUMIN  --  2.5*  --   --   --   --   AMMONIA 14  --   --   --   --  46*  MG 2.0 1.9 2.1 1.8 1.7  --  CALCIUM 8.6* 8.7* 9.1 9.1 9.2  --       Recent Labs   Lab 07/27/23 0720 07/29/23 0851 07/30/23 0616 07/31/23 0637 08/01/23 0758 08/01/23 1244  AMMONIA 14  --   --   --   --  46*  MG 2.0 1.9 2.1 1.8 1.7  --   CALCIUM 8.6* 8.7* 9.1 9.1 9.2  --     --------------------------------------------------------------------------------------------------------------- Lab Results  Component Value Date   CHOL 107 06/29/2023   HDL <10 (L) 06/29/2023   LDLCALC UNABLE TO CALCULATE IF TRIGLYCERIDE OVER 400 mg/dL 16/03/9603   LDLDIRECT 29 07/01/2023   TRIG 521 (H) 06/29/2023   CHOLHDL NOT CALCULATED 06/29/2023    Lab Results  Component Value Date   HGBA1C 6.4 (H) 06/27/2023   No results for input(s): "TSH", "T4TOTAL", "FREET4", "T3FREE", "THYROIDAB" in the last 72 hours. Recent Labs    08/01/23 0758  VITAMINB12 1,135*  FERRITIN 964*  TIBC 283  IRON 64   ------------------------------------------------------------------------------------------------------------------ Cardiac Enzymes No results for input(s): "CKMB", "TROPONINI", "MYOGLOBIN" in the last 168 hours.  Invalid input(s): "CK"  Micro Results No results found for this or any previous visit (from the past 240 hours).  Radiology Reports No results found.    Signature  -   Steffanie Rainwater M.D on 08/02/2023 at 8:09 AM   -  To page go to [email or web address]

## 2023-08-02 NOTE — Progress Notes (Signed)
Initial Nutrition Assessment  DOCUMENTATION CODES:   Non-severe (moderate) malnutrition in context of chronic illness  INTERVENTION:  Magic cup TID with meals, each supplement provides 290 kcal and 9 grams of protein Continue with Ensure Plus High Protein po BID, each supplement provides 350 kcal and 20 grams of protein.  Continue MVI with minerals daily Encouraged daughter to bring in familiar foods he may enjoy to promote adequate po intake Discussed nutrition with daughter Gregory [name] Currently no plan for feeding tube placement as this does not align with patients goals of care at this time.  NUTRITION DIAGNOSIS:   Moderate Malnutrition related to chronic illness as evidenced by moderate fat depletion, moderate muscle depletion.  Ongoing  GOAL:   Patient will meet greater than or equal to 90% of their needs  MONITOR:   PO intake, Supplement acceptance, Weight trends  REASON FOR ASSESSMENT:   Consult Assessment of nutrition requirement/status  ASSESSMENT:   Pt [facility] ED from [facility] with agitated delirium. Pt admitted for acute encephalopathy. Recent admission from 1/9-1/23 for septic shock and AMS secondary to ESBL UTI and serratia/klebsiella multifocal pneumonia/positive rhinovirus. 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  1/28-ICU 1/28- Medical ICU 2/3-Medical Unit  Palliative care consulted and has spoken with daughter, Gregory [name] Pt remains full code at this time.  Recorded po intake ranges form 0-100% over last 2 weeks. Pt was confused upon visit and was not able to hold a conversation for nutrition interview. Pt was being taken out of his restraints during visit and was trying to get out of bed. Pt was able to report that he likes cheeseburgers, fries, and ice cream. RD ordered lunch for him given these food preferences which  included a burger, fries, and magic cup. Per phone call with pt's daughter, RN reinforced that pt enjoyed and ate this meal. Spoke with nurse in room who said it has been difficult for him to eat given the amount of time he is sedated and sleeping during the day. This has caused him to miss meals. Noted psych consulted and making medication adjustments. Pt with poor dentition but daughter was able to confirm that he has no issues chewing.  Spoke with daughter Gregory [name]) on phone about pt's recent intake. She reports he normally eats very well and typically likes to eat fast food like [employer]. We discussed that pt has had poor po intake during his stay this admission due to restraints and sedation. She reports she thinks his intake will be adequate if he is given food without being in restraints and has someone to help feed him. Daughter reports not wanting pt to have any temporary tube feeding at this time and would like to give him a chance to eat orally instead when he is out of restraints and not sedated. Daughter reports pt got ostomy last summer and that his intake was poor back then but that was due to the abdominal surgery from the ostomy.  Weight history:  Admit weight: 80.6 kg Current weight: 79.3 kg Review of weight history revealed a 24% weight loss since August 2024. 24% weight loss in 6 months is clinically significant for the time frame. In the last month he has had a 8% weight loss.  Meds: Pepcid, Ensure Enlive/Ensure Plus BID, MVI, Protonix, Senokot, Floomax, Miralax PRN  Labs: sodium low, creatinine 1.61, CBG 159 (2/12)  Nutrition  Focused Physical Exam Flowsheet Row Most Recent Value  Orbital Region Mild depletion  Upper Arm Region Moderate depletion  Thoracic and Lumbar Region Moderate depletion  Buccal Region Mild depletion  Temple Region Moderate depletion  Clavicle Bone Region Moderate depletion  Clavicle and Acromion Bone Region Moderate depletion  Scapular Bone Region  Severe depletion  Dorsal Hand Unable to assess  Patellar Region Severe depletion  Anterior Thigh Region Severe depletion  Posterior Calf Region Severe depletion  [n/a on right side due to AKA]  Edema (RD Assessment) Mild  Hair Reviewed  Eyes Reviewed  Mouth Reviewed  Skin Reviewed  Nails Reviewed        Diet Order:   Diet Order             Diet regular Room service appropriate? Yes; Fluid consistency: Thin  Diet effective now                   EDUCATION NEEDS:   Not appropriate for education at this time  Skin:  Skin Assessment: Skin Integrity Issues: Skin Integrity Issues:: Stage I Stage I: sacrum  Last BM:  2/13- type 7  Height:   Ht Readings from Last 1 Encounters:  07/16/23 6' (1.829 m)    Weight:   Wt Readings from Last 1 Encounters:  07/31/23 79.3 kg    Ideal Body Weight:     BMI:  Body mass index is 23.72 kg/m.  Estimated Nutritional Needs:   Kcal:  2100-2300  Protein:  110-120 g  Fluid:  >/= 2.1 L    Maceo Pro, MS Dietetic Intern

## 2023-08-02 NOTE — Consult Note (Signed)
 [facility] Psychiatric Consult Follow-up  Patient Name: .Gregory [name]  MRN: [id number]  DOB: 12/06/1960  Consult Order details: [place]/severe agitation.   Mode of Visit: In person   Psychiatry Consult Evaluation  Service Date: August 02, 2023 LOS:  LOS: 17 days  Chief Complaint "I'm in pain everywhere"  Primary Psychiatric Diagnoses  Hyperactive delirium  2.  Somatic symptom disorder with predominant pain 3. History of MDD   Assessment  Gregory [name] is a 63 y.o. male admitted: Medicallyfor 07/15/2023  3:05 PM for acute agitated encephalopathy. 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   His current presentation of inattention, waxing and waning mental status, change over the last couple of days is most consistent with hyperactive delirium likely in the setting of multiple medications and uncontrolled pain. There is suspicion for somatic symptom disorder with predominant pain given chart review notable for multiple outpatient visits to chronic pain physicians and continued use of oxy throughout the years though notably nothing on PDMP. Main psychiatric diagnosis over the years has been depression/anxiety and patient and collateral denies any past psychiatric hospitalizations or suicide attempts. He reports depression but is unable to state what medications he has been on and whether or not they have helped. Current outpatient psychotropic medications from last admission included scheduled lexapro 20, oxy 15 TID, lyrica 25 TID, 100 BID, seroquel 100 at bedtime, PRN haldol 5 Q6H, oxy 5 Q6H and historically he was fully alert and mostly oriented on these medications. He was likely compliant with medications prior to admission since he was at the nursing home. On initial examination,  patient is alert, irritable, is not able to participate in sustained attention testing and is reporting pain. Will recommend decreasing seroquel, stopping klonopin, continuing depakote, making sure his pain is well controlled. I suspect that he has a low frustration tolerance with regards to his pain which contributes to agitation - was put on several anticholinergic or otherwise deliriogenic meds when in hyperactive delirium - finding right balance has been major challenge of this admission. Globally alertness and participation in PT has improved w/ reduction of medications with setback w/ recent UTI. Patient continued to wax and wane between alertness and somnolence. When alert, he would interfere with medical care requiring restraints. Given ineffectiveness of seroquel and haldol, he was started on zyprexa. Discussed with daughter. With increase in repeat ammonia and thrombocytopenia, depakote was ultimately tapered. He received zyprexa overnight but per nurse was still agitated and did not sleep. On 2/14, patient appeared somnolent after getting total zyprexa 20mg  overnight. Will plan to increase scheduled zyprexa to 15mg  and discontinue the depakote today.    Diagnoses:  Active Hospital problems: Principal Problem:   Acute encephalopathy Active Problems:   Malnutrition of moderate degree    Plan  ## Psychiatric Medication Recommendations:  --stop seroquel 100 QHS for mood stabilization and insomnia given ineffectiveness, concern for polypharmacy --increase zyprexa 15mg  at bedtime for insomnia, mood stabilization, hallucinations --continue melatonin 3mg  at bedtime for insomnia, promote regular sleep-wake cycle --continue lexapro 20 daily for depression  --stop klonopin 1 BID for agitation  --stop depakote 500 ER at bedtime for mood stabilization  --optimize pain regimen  -- stop PRN haldol for agitation given ineffective, start PRN zyprexa 5mg  PO or IM q8H for severe  agitation, would not give  PRN ativan within 2 hours of administration due to risk of respiratory depression  -- OTO PRN ativan  ## Medical Decision Making Capacity: Not specifically addressed in this encounter  ## Further Work-up:  - VPA level 41 (albumin low 2.5 most recently) - will hold on further increases given clinical effect d/t low albumin.  --repeat ammonia level 2/13 46 -- most recent EKG on 2/13 had Qtc of 422 -- Pertinent labwork reviewed earlier this admission includes: CMP (Cr 1.52), CBC, TSH, UDS  ## Disposition:-- There are no psychiatric contraindications to discharge at this time  ## Behavioral / Environmental: -Delirium Precautions: Delirium Interventions for Nursing and Staff: - RN to open blinds every AM. - To Bedside: Glasses, hearing aide, and pt's own shoes. Make available to patients. when possible and encourage use. - Encourage po fluids when appropriate, keep fluids within reach. - OOB to chair with meals. - Passive ROM exercises to all extremities with AM & PM care. - RN to assess orientation to person, time and place QAM and PRN. - Recommend extended visitation hours with familiar family/friends as feasible. - Staff to minimize disturbances at night. Turn off television when pt asleep or when not in use.   ## Safety and Observation Level:  - Based on my clinical evaluation, I estimate the patient to be at moderate risk of self harm in the current setting due to agitation. - At this time, we recommend  safety observation. This decision is based on my review of the chart including patient's history and current presentation, interview of the patient, mental status examination, and consideration of suicide risk including evaluating suicidal ideation, plan, intent, suicidal or self-harm behaviors, risk factors, and protective factors. This judgment is based on our ability to directly address suicide risk, implement suicide prevention strategies, and develop a safety plan while the patient is in the  clinical setting. Please contact our team if there is a concern that risk level has changed.  CSSR Risk Category:C-SSRS RISK CATEGORY: No Risk  Suicide Risk Assessment: Patient has following modifiable risk factors for suicide: recklessness, which we are addressing by optimizing his medications Patient has following non-modifiable or demographic risk factors for suicide: male gender Patient has the following protective factors against suicide: Supportive family, no history of suicide attempts, and no history of NSSIB  Thank you for this consult request. Recommendations have been communicated to the primary team.  We will continue to follow at this time.   Karie Fetch, MD, PGY-2       History of Present Illness  Relevant Aspects of [facility] Course:  Admitted on 07/15/2023 for acute agitated encephalopathy.  -Required precedex  -Acute encephalopathy thought to be sepsis related ?AKI vs CKD  -Received 7 day course of CTX for possible UTI  ON events:  -seen by palliative, continue full scope care -continuing to require wrist restraints as he pulls at medical equipment -BP 140/79 -Received PRN zyprexa x2 (total 20mg  yesterday) -Repeat Ammonia 46  Patient Report:  Patient is seen this AM. He is somnolent and ill-appearing in his bed. He is not able to sustain attention for a formal interview. Per nursing, patient was awake all night even after getting the zyprexa. They report he was yelling all night long and continued to require restraints due to interference with medical care. Reports he is more alert in the afternoon. Nurse reports was not able to give him AM meds due to somnolence.   Psych ROS:  Depression: reports always had depression Anxiety: reports anxiety  Mania (lifetime and current): denies  Psychosis: (lifetime and current): none  Collateral information:  Called daughter [name] 2/12:  She reports patient is still not back to baseline, still appears confused.  Reports he also mentioned seeing her son at the bedside but he is not there. He asks her if he's going crazy. Discussed changing seroquel to zyprexa to see if that will help with sleep. She reports he slept later even prior to all his medical issues. She also asks about thrombocytopenia and depakote.   Contacted daughter [name] at [phone] on 2/5 She reports when he was discharged from last hospitalization, he was confused but pleasant and calm. Was with him when he was admitted to hospital. States he is not himself. States his agitation beforehand is if they were taking a long time to get his medication. He was more confused and didn't understand situation. Called her this morning and was with him last night, called her at 9pm. He was asking if she was coming to pick him up. He seemed confused about where he was last night. He seems a little more confused. Prior to admission, he was falling on the floor, trying to pee everywhere. Was not realizing where he was. On Monday nursing home told her he kept on falling in the floor. He didn't call her as he usually does. Still occasionally staring off when she was with him in the hospital. Trying to feed himself but weak. Held his pain medication. She reports she felt like he was a zombie yesterday. Sometimes need xanax when anxious. Yesterday he was telling her to clean up ice cream on floor. She thought perhaps he was bipolar. He can flip a switch really quickly, gets agitated easily. If he doesn't get what he wants he gets really mad, has always been like that. He's been a functional adult until his health declined. Struggles with anxiety and depression from his health decline. Reports only psychotropic she is aware that he used to take was lexapro before all of his medical conditions.   ROS  Unable to obtain due to patient mental status  Psychiatric and Social History  Psychiatric History:  Information collected from patient, chart review  Prev Dx/Sx:  MDD Current Psych Provider: none Home Meds (current): scheduled lexapro 20, oxy 15 TID, lyrica 25 TID, 100 BID, seroquel 100 at bedtime, PRN haldol 5 Q6H, oxy 5 Q6H Previous Med Trials: wellbutrin, cymbalta, gabapentin Therapy: unknown  Prior Psych Hospitalization: denies  Prior Self Harm: denies Prior Violence: denies  Family Psych History: denies, reports sister struggles with substance use  Family Hx suicide: unknown  Social History:  Living Situation: Lives at [facility] for several years, no access to anything else  Access to weapons/lethal means: none   Substance History Denies substance use history, UDS+opiates, BZD. Patient required ativan, versed.  -In the past he used marijuana frequently, but then became dependent on pain meds   Exam Findings  Vital Signs:  Temp:  [97.3 F (36.3 C)-98.7 F (37.1 C)] 97.6 F (36.4 C) (02/14 0519) Pulse Rate:  [50-70] 50 (02/14 0519) Resp:  [15-19] 18 (02/14 0519) BP: (116-146)/(58-96) 140/79 (02/14 0519) SpO2:  [95 %-100 %] 100 % (02/14 0519) Blood pressure (!) 140/79, pulse (!) 50, temperature 97.6 F (36.4 C), temperature source Oral, resp. rate 18, height 6' (1.829 m), weight 79.3 kg, SpO2 100%. Body mass index is 23.72 kg/m.  Physical Exam Constitutional:  Appearance: He is obese.  HENT:     Head: Normocephalic.  Pulmonary:     Effort: Pulmonary effort is normal.  Neurological:     Mental Status: He is alert.    Mental Status Exam:  Orientation:  to person, globally situation/location   Memory:   unable to obtain  Concentration:  Concentration: Poor  Recall:  Poor  Attention  Poor  Eye Contact:  Poor  Speech:  unable to assess  Language:  unable to assess  Volume:  unable to assess  Mood: unable to assess  Affect:   pleasant   Thought Process:  unable to obtain   Thought Content:   unable to obtain  Suicidal Thoughts:  unable to assess  Homicidal Thoughts:  unable to assess  Judgement:  unable to  obtain  Insight:  unable to obtain  Psychomotor Activity:  Decreased  Akathisia:  No  Fund of Knowledge:  Unable to obtain  Denied perceptual disturbances - likely intermittent VH Assets: some ability to communicate needs  Cognition:  Impaired  ADL's:  Impaired  AIMS (if indicated):        Other History   These have been pulled in through the EMR, reviewed, and updated if appropriate.  Family History:  The patient's family history includes Bone cancer in his father; Cancer in his father, mother, paternal grandfather, and another family member; Diabetes in his mother and another family member; Heart disease in his mother; Prostate cancer in his father.  Medical History: 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    Chronic pain syndrome    COPD (chronic obstructive pulmonary disease) (HCC)    DDD (degenerative disc disease), lumbar    Depression    major depressive disorder    Diabetes mellitus without complication (HCC)    type 2    Diabetic ulcer of toe of left foot (HCC) 10/02/2016   Difficult intubation    Difficulty in walking, not elsewhere classified    Epidural abscess 10/02/2016   Foot drop, bilateral    Foot drop, right 12/27/2015   GERD (gastroesophageal reflux disease)    Herpesviral vesicular dermatitis    History of COVID-19 03/15/2022   Hyperlipidemia    Hyperlipidemia    Hypertension    Insomnia    Malignant melanoma of other parts of face (HCC)    Melanoma  (HCC)    MRSA bacteremia    Muscle weakness (generalized)    Nasal congestion    Necrosis of toe (HCC) 07/08/2018   Neuromuscular dysfunction of bladder    Osteomyelitis (HCC)    Osteomyelitis (HCC)    Osteomyelitis (HCC)    right BKA   Other idiopathic peripheral autonomic neuropathy    Retention of urine, unspecified    Squamous cell carcinoma of skin    Stroke (HCC)    Unsteadiness on feet    Urinary retention  Urinary retention    Urinary tract infection    Wears dentures    Wears glasses     Surgical History: Past Surgical History:  Procedure Laterality Date   AMPUTATION Left 03/29/2017   Procedure: LEFT GREAT TOE AMPUTATION AT METATARSOPHALANGEAL JOINT;  Surgeon: Nadara Mustard, MD;  Location: [facility] OR;  Service: Orthopedics;  Laterality: Left;   AMPUTATION Right 03/29/2017   Procedure: RIGHT BELOW KNEE AMPUTATION;  Surgeon: Nadara Mustard, MD;  Location: [facility] OR;  Service: Orthopedics;  Laterality: Right;   AMPUTATION Left 06/06/2018   Procedure: LEFT 2ND TOE AMPUTATION;  Surgeon: Nadara Mustard, MD;  Location: [facility] OR;  Service: Orthopedics;  Laterality: Left;   AMPUTATION Right 11/19/2018   Procedure: AMPUTATION ABOVE KNEE;  Surgeon: Nadara Mustard, MD;  Location: [facility] OR;  Service: Orthopedics;  Laterality: Right;   ANTERIOR CERVICAL CORPECTOMY N/A 11/25/2015   Procedure: ANTERIOR CERVICAL FIVE CORPECTOMY Cervical four - six fusion;  Surgeon: Lisbeth Renshaw, MD;  Location: [facility];  Service: Neurosurgery;  Laterality: N/A;  ANTERIOR CERVICAL FIVE CORPECTOMY Cervical four - six fusion   APPLICATION OF WOUND VAC Right 10/22/2018   Procedure: Application Of Wound Vac;  Surgeon: Nadara Mustard, MD;  Location: [facility] OR;  Service: Orthopedics;  Laterality: Right;   CYSTOSCOPY WITH BIOPSY N/A 05/01/2022   Procedure: CYSTOSCOPY WITH URETHRAL BIOPSY;  Surgeon: Noel Christmas, MD;  Location: [facility] ORS;  Service: Urology;  Laterality: N/A;  1 HR   CYSTOSCOPY WITH RETROGRADE URETHROGRAM  N/A 04/25/2018   Procedure: CYSTOSCOPY WITH RETROGRADE URETHROGRAM/ BALLOON DILATION;  Surgeon: Ihor Gully, MD;  Location: [facility] ORS;  Service: Urology;  Laterality: N/A;   CYSTOSCOPY WITH URETHRAL DILATATION N/A 05/01/2022   Procedure: BALLOON DILATION WITH  OPTILUME;  Surgeon: Noel Christmas, MD;  Location: [facility] ORS;  Service: Urology;  Laterality: N/A;   LAPAROTOMY N/A 12/03/2022   Procedure: EXPLORATORY LAPAROTOMY  total abdominal colectomy with end ileostomy;  Surgeon: Kinsinger, De Blanch, MD;  Location: [facility] OR;  Service: General;  Laterality: N/A;   MULTIPLE TOOTH EXTRACTIONS     POSTERIOR CERVICAL FUSION/FORAMINOTOMY N/A 11/29/2015   Procedure: Cervical Three-Cervical Seven Posterior Cervical Laminectomy with Fusion;  Surgeon: Lisbeth Renshaw, MD;  Location: [facility];  Service: Neurosurgery;  Laterality: N/A;  Cervical Three-Cervical Seven Posterior Cervical Laminectomy with Fusion   STUMP REVISION Right 10/22/2018   Procedure: REVISION RIGHT BELOW KNEE AMPUTATION;  Surgeon: Nadara Mustard, MD;  Location: [facility] OR;  Service: Orthopedics;  Laterality: Right;   STUMP REVISION Right 12/05/2018   Procedure: Revision Right Above Knee Amputation;  Surgeon: Nadara Mustard, MD;  Location: [facility] OR;  Service: Orthopedics;  Laterality: Right;   STUMP REVISION Right 05/29/2019   Procedure: REVISION RIGHT ABOVE KNEE AMPUTATION;  Surgeon: Nadara Mustard, MD;  Location: [facility] OR;  Service: Orthopedics;  Laterality: Right;   STUMP REVISION Right 06/24/2019   Procedure: STUMP REVISION;  Surgeon: Nadara Mustard, MD;  Location: [facility] OR;  Service: Orthopedics;  Laterality: Right;     Medications:   Current Facility-Administered Medications:    acetaminophen (TYLENOL) tablet 650 mg, 650 mg, Oral, Q6H, Lorin Glass, MD, 650 mg at 08/02/23 0516   arformoterol (BROVANA) nebulizer solution 15 mcg, 15 mcg, Nebulization, BID, Agarwala, Ravi, MD, 15 mcg at 08/01/23 8657   aspirin EC tablet 81 mg, 81 mg, Oral, Daily,  Hunsucker, Lesia Sago, MD, 81 mg at 08/01/23 0824   atorvastatin (LIPITOR) tablet 80 mg, 80 mg,  Oral, QHS, Cristopher Peru, [name], 80 mg at 08/01/23 2040   budesonide (PULMICORT) nebulizer solution 0.5 mg, 0.5 mg, Nebulization, BID, Lidia Collum, [name], 0.5 mg at 08/01/23 0754   carvedilol (COREG) tablet 12.5 mg, 12.5 mg, Oral, BID WC, Autry, Lauren E, [name], 12.5 mg at 08/01/23 4098   Chlorhexidine Gluconate Cloth 2 % PADS 6 each, 6 each, Topical, Daily, Lorin Glass, MD, 6 each at 08/01/23 1345   divalproex (DEPAKOTE ER) 24 hr tablet 500 mg, 500 mg, Oral, QHS, Karie Fetch, MD, 500 mg at 08/01/23 2043   escitalopram (LEXAPRO) tablet 20 mg, 20 mg, Oral, Daily, Autry, Lauren E, [name], 20 mg at 08/01/23 1191   famotidine (PEPCID) tablet 20 mg, 20 mg, Oral, Daily, Lidia Collum, [name], 20 mg at 08/01/23 4782   feeding supplement (ENSURE ENLIVE / ENSURE PLUS) liquid 237 mL, 237 mL, Oral, BID BM, Hunsucker, Lesia Sago, MD, 237 mL at 08/01/23 1429   heparin injection 5,000 Units, 5,000 Units, Subcutaneous, Q8H, Lidia Collum, [name], 5,000 Units at 08/02/23 9562   hydrALAZINE (APRESOLINE) injection 10-40 mg, 10-40 mg, Intravenous, Q4H PRN, Pia Mau D, [name]   hydrALAZINE (APRESOLINE) tablet 10 mg, 10 mg, Oral, Q8H, Autry, Lauren E, [name], 10 mg at 08/02/23 0516   HYDROmorphone (DILAUDID) tablet 3 mg, 3 mg, Oral, Q6H, Marolyn Haller, MD, 3 mg at 08/02/23 0515   ipratropium-albuterol (DUONEB) 0.5-2.5 (3) MG/3ML nebulizer solution 3 mL, 3 mL, Nebulization, Q4H PRN, Suzie Portela, John D, [name]   melatonin tablet 3 mg, 3 mg, Oral, QHS, Karie Fetch, MD, 3 mg at 08/01/23 2041   multivitamin with minerals tablet 1 tablet, 1 tablet, Oral, Daily, Toni Amend W, DO, 1 tablet at 08/01/23 0824   OLANZapine zydis (ZYPREXA) disintegrating tablet 5 mg, 5 mg, Oral, Q6H PRN **OR** OLANZapine (ZYPREXA) injection 5 mg, 5 mg, Intramuscular, Q6H PRN, Marolyn Haller, MD, 5 mg at 08/01/23 2310   OLANZapine zydis (ZYPREXA)  disintegrating tablet 10 mg, 10 mg, Oral, QHS, Karie Fetch, MD, 10 mg at 08/01/23 2040   Oral care mouth rinse, 15 mL, Mouth Rinse, PRN, Kalman Shan, MD   pantoprazole (PROTONIX) EC tablet 40 mg, 40 mg, Oral, Daily, Autry, Lauren E, [name], 40 mg at 08/01/23 0824   polyethylene glycol (MIRALAX / GLYCOLAX) packet 17 g, 17 g, Oral, Daily PRN, Suzie Portela, John D, [name]   pregabalin (LYRICA) capsule 50 mg, 50 mg, Oral, BID, Marolyn Haller, MD, 50 mg at 08/01/23 2041   revefenacin (YUPELRI) nebulizer solution 175 mcg, 175 mcg, Nebulization, Daily, Lidia Collum, [name], 175 mcg at 08/01/23 1308   senna (SENOKOT) tablet 8.6 mg, 1 tablet, Oral, Daily, Marolyn Haller, MD, 8.6 mg at 08/01/23 6578   tamsulosin [facility]) capsule 0.4 mg, 0.4 mg, Oral, Daily, Toni Amend W, DO, 0.4 mg at 08/01/23 4696   [COMPLETED] thiamine (VITAMIN B1) 500 mg in sodium chloride 0.9 % 50 mL IVPB, 500 mg, Intravenous, Q8H, Last Rate: 110 mL/hr at 07/21/23 0600, Infusion Verify at 07/21/23 0600 **FOLLOWED BY** thiamine (VITAMIN B1) tablet 100 mg, 100 mg, Oral, Daily, Lorin Glass, MD, 100 mg at 08/01/23 2952  Allergies: Allergies  Allergen Reactions   Latex Other (See Comments)    Unknown reaction    Karie Fetch, MD, PGY-2

## 2023-08-03 DIAGNOSIS — Z89611 Acquired absence of right leg above knee: Secondary | ICD-10-CM

## 2023-08-03 DIAGNOSIS — Z8659 Personal history of other mental and behavioral disorders: Secondary | ICD-10-CM | POA: Diagnosis not present

## 2023-08-03 DIAGNOSIS — E440 Moderate protein-calorie malnutrition: Secondary | ICD-10-CM

## 2023-08-03 DIAGNOSIS — F451 Undifferentiated somatoform disorder: Secondary | ICD-10-CM

## 2023-08-03 DIAGNOSIS — R410 Disorientation, unspecified: Secondary | ICD-10-CM

## 2023-08-03 DIAGNOSIS — R451 Restlessness and agitation: Secondary | ICD-10-CM

## 2023-08-03 LAB — CBC
HCT: 32.2 % — ABNORMAL LOW (ref 39.0–52.0)
Hemoglobin: 10.3 g/dL — ABNORMAL LOW (ref 13.0–17.0)
MCH: 29.1 pg (ref 26.0–34.0)
MCHC: 32 g/dL (ref 30.0–36.0)
MCV: 91 fL (ref 80.0–100.0)
Platelets: 138 10*3/uL — ABNORMAL LOW (ref 150–400)
RBC: 3.54 MIL/uL — ABNORMAL LOW (ref 4.22–5.81)
RDW: 15.5 % (ref 11.5–15.5)
WBC: 6.4 10*3/uL (ref 4.0–10.5)
nRBC: 0 % (ref 0.0–0.2)

## 2023-08-03 LAB — COMPREHENSIVE METABOLIC PANEL
ALT: 13 U/L (ref 0–44)
AST: 19 U/L (ref 15–41)
Albumin: 2.9 g/dL — ABNORMAL LOW (ref 3.5–5.0)
Alkaline Phosphatase: 71 U/L (ref 38–126)
Anion gap: 10 (ref 5–15)
BUN: 19 mg/dL (ref 8–23)
CO2: 22 mmol/L (ref 22–32)
Calcium: 9.3 mg/dL (ref 8.9–10.3)
Chloride: 102 mmol/L (ref 98–111)
Creatinine, Ser: 1.61 mg/dL — ABNORMAL HIGH (ref 0.61–1.24)
GFR, Estimated: 47 mL/min — ABNORMAL LOW (ref >60)
Glucose, Bld: 106 mg/dL — ABNORMAL HIGH (ref 70–99)
Potassium: 4.5 mmol/L (ref 3.5–5.1)
Sodium: 134 mmol/L — ABNORMAL LOW (ref 135–145)
Total Bilirubin: 0.5 mg/dL (ref 0.0–1.2)
Total Protein: 6.7 g/dL (ref 6.5–8.1)

## 2023-08-03 LAB — MAGNESIUM: Magnesium: 1.5 mg/dL — ABNORMAL LOW (ref 1.7–2.4)

## 2023-08-03 LAB — PHOSPHORUS: Phosphorus: 4.2 mg/dL (ref 2.5–4.6)

## 2023-08-03 MED ORDER — CLONAZEPAM 1 MG PO TABS
1.0000 mg | ORAL_TABLET | Freq: Every day | ORAL | Status: DC
  Administered 2023-08-03 – 2023-08-07 (×5): 1 mg via ORAL
  Filled 2023-08-03 (×5): qty 1

## 2023-08-03 NOTE — Progress Notes (Addendum)
Patient has been agitated all shift. Screaming this nurses name continuously, pulling off ileostomy bag several times, getting stool all over bed and bedside table, and attempting to get out of bed unassisted. Prn Zyprexa given with no effect. Requested order for restraints.   0230: patient found up in chair at bedside covered in stool after pulling off ileostomy appliance and rubbing at his stoma. Stoma now is more swollen and red than at the start of the shift.Patient cleaned, appliance replaced, and assisted back to bed. Placed in soft wrist restraints for patients safety.  0257: Patient continues to yell out. Having hallucinations that there are cats in the room and insects crawling in the bed. Lights turned off and curtain pulled to encourage patient to fall asleep.   0500: Patient continues to scream and be disruptive. Has not slept any this shift. All attempts have been made to keep patient calm and encourage to sleep. No intervention has been effective at this point.

## 2023-08-03 NOTE — Progress Notes (Signed)
Pt agitated at this time. Yelling out. Refusing to take nebulizers.

## 2023-08-03 NOTE — Consult Note (Signed)
[facility] Psychiatric Consult Follow-up  Patient Name: .Gregory [name]  MRN: [id number]  DOB: 06-30-1960  Consult Order details: [place]/severe agitation.   Mode of Visit: In person   Psychiatry Consult Evaluation  Service Date: August 03, 2023 LOS:  LOS: 18 days  Chief Complaint "I'm in pain everywhere"  Primary Psychiatric Diagnoses  Hyperactive delirium  2.  Somatic symptom disorder with predominant pain 3. History of MDD   Assessment  Gregory [name] is a 63 y.o. male admitted: Medicallyfor 07/15/2023  3:05 PM for acute agitated encephalopathy. 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   His current presentation of inattention, waxing and waning mental status, change over the last couple of days is most consistent with hyperactive delirium likely in the setting of multiple medications and uncontrolled pain. There is suspicion for somatic symptom disorder with predominant pain given chart review notable for multiple outpatient visits to chronic pain physicians and continued use of oxy throughout the years though notably nothing on PDMP. Main psychiatric diagnosis over the years has been depression/anxiety and patient and collateral denies any past psychiatric hospitalizations or suicide attempts. He reports depression but is unable to state what medications he has been on and whether or not they have helped. Current outpatient psychotropic medications from last admission included scheduled lexapro 20, oxy 15 TID, lyrica 25 TID, 100 BID, seroquel 100 at bedtime, PRN haldol 5 Q6H, oxy 5 Q6H and historically he was fully alert and mostly oriented on these medications. He was likely compliant with medications prior to admission since he was at the nursing home. On initial examination,  patient is alert, irritable, is not able to participate in sustained attention testing and is reporting pain. Will recommend decreasing seroquel, stopping klonopin, continuing depakote, making sure his pain is well controlled. I suspect that he has a low frustration tolerance with regards to his pain which contributes to agitation - was put on several anticholinergic or otherwise deliriogenic meds when in hyperactive delirium - finding right balance has been major challenge of this admission. Globally alertness and participation in PT has improved w/ reduction of medications with setback w/ recent UTI. Patient continued to wax and wane between alertness and somnolence. When alert, he would interfere with medical care requiring restraints. Given ineffectiveness of seroquel and haldol, he was started on zyprexa. Discussed with daughter. With increase in repeat ammonia and thrombocytopenia, depakote was ultimately tapered. He received zyprexa overnight but per nurse was still agitated and did not sleep. On 2/14, patient appeared somnolent after getting total zyprexa 20mg  overnight. Will plan to increase scheduled zyprexa to 15mg  and discontinue the depakote today.   08/03/2023: Patient seen face to face in his hospital room. He is awake, alert and oriented x 3. Patient reports he did not sleep well overnight because he was not feeling comfortable due to pain. But he denies suicidal or homicidal ideation, intent or plan. Patient is on soft restraints in the bilateral upper extremities because he was pulling out his colostomy bag as reported by the treating nurse. Additionally, the nurse reports that patient was agitated last night and earlier today requiring IM medication. Current medications would be adjusted as outlined in the plan section below.   Diagnoses:  Active Hospital problems: Principal Problem:   Acute encephalopathy Active  Problems:   Malnutrition of moderate degree   Persistent hyperactive  delirium due to multiple etiologies    Plan  ## Psychiatric Medication Recommendations:  --stop seroquel 100 QHS for mood stabilization and insomnia given ineffectiveness, concern for polypharmacy --Continue Zyprexa to 15 mg at bedtime for insomnia, mood stabilization, hallucinations --continue melatonin 3mg  at bedtime for insomnia, promote regular sleep-wake cycle --continue lexapro 20 daily for depression  --Resume klonopin 1 mg at bedtime for sleep/agitation  --stop depakote 500 ER at bedtime for mood stabilization-due to hyperammonemia --optimize pain regimen  -- Continue Zyprexa 5mg  PO or IM q8H PRN or severe agitation, would not give PRN ativan within 2 hours of administration due to risk of respiratory depression  -- OTO PRN ativan  ## Medical Decision Making Capacity: Not specifically addressed in this encounter  ## Further Work-up:  - VPA level 41 (albumin low 2.5 most recently) - will hold on further increases given clinical effect d/t low albumin.  --repeat ammonia level 2/13 46 -- most recent EKG on 2/13 had Qtc of 422 -- Pertinent labwork reviewed earlier this admission includes: CMP (Cr 1.52), CBC, TSH, UDS  ## Disposition:-- There are no psychiatric contraindications to discharge at this time  ## Behavioral / Environmental: -Delirium Precautions: Delirium Interventions for Nursing and Staff: - RN to open blinds every AM. - To Bedside: Glasses, hearing aide, and pt's own shoes. Make available to patients. when possible and encourage use. - Encourage po fluids when appropriate, keep fluids within reach. - OOB to chair with meals. - Passive ROM exercises to all extremities with AM & PM care. - RN to assess orientation to person, time and place QAM and PRN. - Recommend extended visitation hours with familiar family/friends as feasible. - Staff to minimize disturbances at night. Turn off television when pt asleep or when not in use.   ## Safety and Observation Level:  - Based  on my clinical evaluation, I estimate the patient to be at moderate risk of self harm in the current setting due to agitation. - At this time, we recommend  safety observation. This decision is based on my review of the chart including patient's history and current presentation, interview of the patient, mental status examination, and consideration of suicide risk including evaluating suicidal ideation, plan, intent, suicidal or self-harm behaviors, risk factors, and protective factors. This judgment is based on our ability to directly address suicide risk, implement suicide prevention strategies, and develop a safety plan while the patient is in the clinical setting. Please contact our team if there is a concern that risk level has changed.  CSSR Risk Category:C-SSRS RISK CATEGORY: No Risk  Suicide Risk Assessment: Patient has following modifiable risk factors for suicide: recklessness, which we are addressing by optimizing his medications Patient has following non-modifiable or demographic risk factors for suicide: male gender Patient has the following protective factors against suicide: Supportive family, no history of suicide attempts, and no history of NSSIB  Thank you for this consult request. Recommendations have been communicated to the primary team.  We will continue to follow at this time.   Fredonia Highland, MD                                                               History  of Present Illness  Relevant Aspects of [facility] Course:  Admitted on 07/15/2023 for acute agitated encephalopathy.  -Required precedex  -Acute encephalopathy thought to be sepsis related ?AKI vs CKD  -Received 7 day course of CTX for possible UTI  ON events:  -seen by palliative, continue full scope care -continuing to require wrist restraints as he pulls at medical equipment -BP 140/79 -Received PRN zyprexa x2 (total 20mg  yesterday) -Repeat Ammonia 46  Patient Report:  Patient is seen this  AM. He is somnolent and ill-appearing in his bed. He is not able to sustain attention for a formal interview. Per nursing, patient was awake all night even after getting the zyprexa. They report he was yelling all night long and continued to require restraints due to interference with medical care. Reports he is more alert in the afternoon. Nurse reports was not able to give him AM meds due to somnolence.   Psych ROS:  Depression: reports always had depression Anxiety: reports anxiety  Mania (lifetime and current): denies  Psychosis: (lifetime and current): none  Collateral information:  Called daughter [name] 2/12:  She reports patient is still not back to baseline, still appears confused. Reports he also mentioned seeing her son at the bedside but he is not there. He asks her if he's going crazy. Discussed changing seroquel to zyprexa to see if that will help with sleep. She reports he slept later even prior to all his medical issues. She also asks about thrombocytopenia and depakote.   Contacted daughter [name] at [phone] on 2/5 She reports when he was discharged from last hospitalization, he was confused but pleasant and calm. Was with him when he was admitted to hospital. States he is not himself. States his agitation beforehand is if they were taking a long time to get his medication. He was more confused and didn't understand situation. Called her this morning and was with him last night, called her at 9pm. He was asking if she was coming to pick him up. He seemed confused about where he was last night. He seems a little more confused. Prior to admission, he was falling on the floor, trying to pee everywhere. Was not realizing where he was. On Monday nursing home told her he kept on falling in the floor. He didn't call her as he usually does. Still occasionally staring off when she was with him in the hospital. Trying to feed himself but weak. Held his pain medication. She reports she felt  like he was a zombie yesterday. Sometimes need xanax when anxious. Yesterday he was telling her to clean up ice cream on floor. She thought perhaps he was bipolar. He can flip a switch really quickly, gets agitated easily. If he doesn't get what he wants he gets really mad, has always been like that. He's been a functional adult until his health declined. Struggles with anxiety and depression from his health decline. Reports only psychotropic she is aware that he used to take was lexapro before all of his medical conditions.   ROS  Unable to obtain due to patient mental status  Psychiatric and Social History  Psychiatric History:  Information collected from patient, chart review  Prev Dx/Sx: MDD Current Psych Provider: none Home Meds (current): scheduled lexapro 20, oxy 15 TID, lyrica 25 TID, 100 BID, seroquel 100 at bedtime, PRN haldol 5 Q6H, oxy 5 Q6H Previous Med Trials: wellbutrin, cymbalta, gabapentin Therapy: unknown  Prior Psych Hospitalization: denies  Prior Self Harm: denies Prior Violence: denies  Family Psych History: denies, reports sister struggles with substance use  Family Hx suicide: unknown  Social History:  Living Situation: Lives at [facility] for several years, no access to anything else  Access to weapons/lethal means: none   Substance History Denies substance use history, UDS+opiates, BZD. Patient required ativan, versed.  -In the past he used marijuana frequently, but then became dependent on pain meds   Exam Findings  Vital Signs:  Temp:  [97.2 F (36.2 C)-98.4 F (36.9 C)] 98.4 F (36.9 C) (02/15 0950) Pulse Rate:  [55-68] 68 (02/15 0950) Resp:  [17-20] 17 (02/15 0950) BP: (115-135)/(72-88) 130/72 (02/15 0950) SpO2:  [97 %-100 %] 97 % (02/15 0950) Weight:  [80.5 kg] 80.5 kg (02/15 0517) Blood pressure 130/72, pulse 68, temperature 98.4 F (36.9 C), temperature source Oral, resp. rate 17, height 6' (1.829 m), weight 80.5 kg, SpO2 97%. Body mass  index is 24.07 kg/m.  Physical Exam Constitutional:      Appearance: He is obese.  HENT:     Head: Normocephalic.  Pulmonary:     Effort: Pulmonary effort is normal.  Neurological:     Mental Status: He is alert.    Mental Status Exam:  Orientation:  to person, globally situation/location   Memory:   unable to obtain  Concentration:  Concentration: Poor  Recall:  Poor  Attention  Poor  Eye Contact:  Poor  Speech:  unable to assess  Language:  unable to assess  Volume:  unable to assess  Mood: unable to assess  Affect:   pleasant   Thought Process:  unable to obtain   Thought Content:   unable to obtain  Suicidal Thoughts:  unable to assess  Homicidal Thoughts:  unable to assess  Judgement:  unable to obtain  Insight:  unable to obtain  Psychomotor Activity:  Decreased  Akathisia:  No  Fund of Knowledge:  Unable to obtain  Denied perceptual disturbances - likely intermittent VH Assets: some ability to communicate needs  Cognition:  Impaired  ADL's:  Impaired  AIMS (if indicated):        Other History   These have been pulled in through the EMR, reviewed, and updated if appropriate.  Family History:  The patient's family history includes Bone cancer in his father; Cancer in his father, mother, paternal grandfather, and another family member; Diabetes in his mother and another family member; Heart disease in his mother; Prostate cancer in his father.  Medical History: 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)    Acquired absence of right leg below knee (HCC)    Acute osteomyelitis of calcaneum, right (HCC) 10/02/2016   Acute respiratory failure (HCC)    Amputation stump infection (HCC) 08/12/2018   Anemia    Basal cell carcinoma, eyelid    Cancer (HCC)    Candidiasis 08/12/2018   CHF (congestive heart failure) (HCC)    chronic diastolic    Chronic back pain    Chronic kidney disease    stage 3    Chronic multifocal  osteomyelitis (HCC)    of ankle and foot    Chronic pain syndrome    COPD (chronic obstructive pulmonary disease) (HCC)    DDD (degenerative disc disease), lumbar    Depression    major depressive disorder    Diabetes mellitus without complication (HCC)    type 2    Diabetic ulcer of toe of left foot (HCC) 10/02/2016   Difficult intubation  Difficulty in walking, not elsewhere classified    Epidural abscess 10/02/2016   Foot drop, bilateral    Foot drop, right 12/27/2015   GERD (gastroesophageal reflux disease)    Herpesviral vesicular dermatitis    History of COVID-19 03/15/2022   Hyperlipidemia    Hyperlipidemia    Hypertension    Insomnia    Malignant melanoma of other parts of face (HCC)    Melanoma (HCC)    MRSA bacteremia    Muscle weakness (generalized)    Nasal congestion    Necrosis of toe (HCC) 07/08/2018   Neuromuscular dysfunction of bladder    Osteomyelitis (HCC)    Osteomyelitis (HCC)    Osteomyelitis (HCC)    right BKA   Other idiopathic peripheral autonomic neuropathy    Retention of urine, unspecified    Squamous cell carcinoma of skin    Stroke (HCC)    Unsteadiness on feet    Urinary retention    Urinary retention    Urinary tract infection    Wears dentures    Wears glasses     Surgical History: Past Surgical History:  Procedure Laterality Date   AMPUTATION Left 03/29/2017   Procedure: LEFT GREAT TOE AMPUTATION AT METATARSOPHALANGEAL JOINT;  Surgeon: Nadara Mustard, MD;  Location: [facility] OR;  Service: Orthopedics;  Laterality: Left;   AMPUTATION Right 03/29/2017   Procedure: RIGHT BELOW KNEE AMPUTATION;  Surgeon: Nadara Mustard, MD;  Location: [facility] OR;  Service: Orthopedics;  Laterality: Right;   AMPUTATION Left 06/06/2018   Procedure: LEFT 2ND TOE AMPUTATION;  Surgeon: Nadara Mustard, MD;  Location: [facility] OR;  Service: Orthopedics;  Laterality: Left;   AMPUTATION Right 11/19/2018   Procedure: AMPUTATION ABOVE KNEE;  Surgeon: Nadara Mustard, MD;   Location: [facility] OR;  Service: Orthopedics;  Laterality: Right;   ANTERIOR CERVICAL CORPECTOMY N/A 11/25/2015   Procedure: ANTERIOR CERVICAL FIVE CORPECTOMY Cervical four - six fusion;  Surgeon: Lisbeth Renshaw, MD;  Location: [facility] OR[facility];  Service: Neurosurgery;  Laterality: N/A;  ANTERIOR CERVICAL FIVE CORPECTOMY Cervical four - six fusion   APPLICATION OF WOUND VAC Right 10/22/2018   Procedure: Application Of Wound Vac;  Surgeon: Nadara Mustard, MD;  Location: [facility] OR;  Service: Orthopedics;  Laterality: Right;   CYSTOSCOPY WITH BIOPSY N/A 05/01/2022   Procedure: CYSTOSCOPY WITH URETHRAL BIOPSY;  Surgeon: Noel Christmas, MD;  Location: [facility] ORS;  Service: Urology;  Laterality: N/A;  1 HR   CYSTOSCOPY WITH RETROGRADE URETHROGRAM N/A 04/25/2018   Procedure: CYSTOSCOPY WITH RETROGRADE URETHROGRAM/ BALLOON DILATION;  Surgeon: Ihor Gully, MD;  Location: [facility] ORS;  Service: Urology;  Laterality: N/A;   CYSTOSCOPY WITH URETHRAL DILATATION N/A 05/01/2022   Procedure: BALLOON DILATION WITH  OPTILUME;  Surgeon: Noel Christmas, MD;  Location: [facility] ORS;  Service: Urology;  Laterality: N/A;   LAPAROTOMY N/A 12/03/2022   Procedure: EXPLORATORY LAPAROTOMY  total abdominal colectomy with end ileostomy;  Surgeon: Kinsinger, De Blanch, MD;  Location: [facility] OR;  Service: General;  Laterality: N/A;   MULTIPLE TOOTH EXTRACTIONS     POSTERIOR CERVICAL FUSION/FORAMINOTOMY N/A 11/29/2015   Procedure: Cervical Three-Cervical Seven Posterior Cervical Laminectomy with Fusion;  Surgeon: Lisbeth Renshaw, MD;  Location: [facility];  Service: Neurosurgery;  Laterality: N/A;  Cervical Three-Cervical Seven Posterior Cervical Laminectomy with Fusion   STUMP REVISION Right 10/22/2018   Procedure: REVISION RIGHT BELOW KNEE AMPUTATION;  Surgeon: Nadara Mustard, MD;  Location: [facility] OR;  Service: Orthopedics;  Laterality: Right;   STUMP  REVISION Right 12/05/2018   Procedure: Revision Right Above Knee Amputation;  Surgeon: Nadara Mustard, MD;   Location: [facility] OR;  Service: Orthopedics;  Laterality: Right;   STUMP REVISION Right 05/29/2019   Procedure: REVISION RIGHT ABOVE KNEE AMPUTATION;  Surgeon: Nadara Mustard, MD;  Location: [facility] OR;  Service: Orthopedics;  Laterality: Right;   STUMP REVISION Right 06/24/2019   Procedure: STUMP REVISION;  Surgeon: Nadara Mustard, MD;  Location: [facility] OR;  Service: Orthopedics;  Laterality: Right;     Medications:   Current Facility-Administered Medications:    acetaminophen (TYLENOL) tablet 650 mg, 650 mg, Oral, Q6H, Lorin Glass, MD, 650 mg at 08/03/23 1409   arformoterol (BROVANA) nebulizer solution 15 mcg, 15 mcg, Nebulization, BID, Agarwala, Ravi, MD, 15 mcg at 08/02/23 0759   aspirin EC tablet 81 mg, 81 mg, Oral, Daily, Hunsucker, Lesia Sago, MD, 81 mg at 08/03/23 4098   atorvastatin (LIPITOR) tablet 80 mg, 80 mg, Oral, QHS, Cristopher Peru, [name], 80 mg at 08/02/23 2008   budesonide (PULMICORT) nebulizer solution 0.5 mg, 0.5 mg, Nebulization, BID, Lidia Collum, [name], 0.5 mg at 08/02/23 0759   carvedilol (COREG) tablet 12.5 mg, 12.5 mg, Oral, BID WC, Autry, Lauren E, [name], 12.5 mg at 08/03/23 1191   Chlorhexidine Gluconate Cloth 2 % PADS 6 each, 6 each, Topical, Daily, Lorin Glass, MD, 6 each at 08/03/23 1416   escitalopram (LEXAPRO) tablet 20 mg, 20 mg, Oral, Daily, Autry, Lauren E, [name], 20 mg at 08/03/23 0948   famotidine (PEPCID) tablet 20 mg, 20 mg, Oral, Daily, Lidia Collum, [name], 20 mg at 08/03/23 4782   feeding supplement (ENSURE ENLIVE / ENSURE PLUS) liquid 237 mL, 237 mL, Oral, BID BM, Hunsucker, Lesia Sago, MD, 237 mL at 08/01/23 1429   heparin injection 5,000 Units, 5,000 Units, Subcutaneous, Q8H, Payne, John D, [name], 5,000 Units at 08/03/23 1415   hydrALAZINE (APRESOLINE) injection 10-40 mg, 10-40 mg, Intravenous, Q4H PRN, Pia Mau D, [name]   hydrALAZINE (APRESOLINE) tablet 10 mg, 10 mg, Oral, Q8H, Autry, Lauren E, [name], 10 mg at 08/03/23 1410   HYDROmorphone (DILAUDID) tablet 3  mg, 3 mg, Oral, Q6H, Marolyn Haller, MD, 3 mg at 08/03/23 1411   ipratropium-albuterol (DUONEB) 0.5-2.5 (3) MG/3ML nebulizer solution 3 mL, 3 mL, Nebulization, Q4H PRN, Suzie Portela, John D, [name]   melatonin tablet 3 mg, 3 mg, Oral, QHS, Karie Fetch, MD, 3 mg at 08/02/23 2008   multivitamin with minerals tablet 1 tablet, 1 tablet, Oral, Daily, Toni Amend W, DO, 1 tablet at 08/03/23 0948   OLANZapine zydis (ZYPREXA) disintegrating tablet 5 mg, 5 mg, Oral, Q6H PRN, 5 mg at 08/03/23 0951 **OR** OLANZapine (ZYPREXA) injection 5 mg, 5 mg, Intramuscular, Q6H PRN, Marolyn Haller, MD, 5 mg at 08/01/23 2310   OLANZapine zydis (ZYPREXA) disintegrating tablet 15 mg, 15 mg, Oral, QHS, Karie Fetch, MD, 15 mg at 08/02/23 2007   Oral care mouth rinse, 15 mL, Mouth Rinse, PRN, Kalman Shan, MD   pantoprazole (PROTONIX) EC tablet 40 mg, 40 mg, Oral, Daily, Autry, Lauren E, [name], 40 mg at 08/03/23 0949   polyethylene glycol (MIRALAX / GLYCOLAX) packet 17 g, 17 g, Oral, Daily PRN, Pia Mau D, [name]   pregabalin (LYRICA) capsule 50 mg, 50 mg, Oral, BID, Marolyn Haller, MD, 50 mg at 08/03/23 9562   revefenacin (YUPELRI) nebulizer solution 175 mcg, 175 mcg, Nebulization, Daily, Lidia Collum, [name], 175 mcg at 08/02/23 0759   senna (SENOKOT) tablet 8.6  mg, 1 tablet, Oral, Daily, Marolyn Haller, MD, 8.6 mg at 08/01/23 1914   tamsulosin [facility]) capsule 0.4 mg, 0.4 mg, Oral, Daily, Toni Amend W, DO, 0.4 mg at 08/03/23 7829   [COMPLETED] thiamine (VITAMIN B1) 500 mg in sodium chloride 0.9 % 50 mL IVPB, 500 mg, Intravenous, Q8H, Last Rate: 110 mL/hr at 07/21/23 0600, Infusion Verify at 07/21/23 0600 **FOLLOWED BY** thiamine (VITAMIN B1) tablet 100 mg, 100 mg, Oral, Daily, Lorin Glass, MD, 100 mg at 08/03/23 5621  Allergies: Allergies  Allergen Reactions   Latex Other (See Comments)    Unknown reaction    Fredonia Highland, MD, PGY-2

## 2023-08-03 NOTE — Progress Notes (Addendum)
Progress Note   Patient: Gregory [name]:[id number] DOB: 10-24-1960 DOA: 07/15/2023     18 DOS: the patient was seen and examined on 08/03/2023   Brief hospital course: Gregory [name] is a 63 year old male with history of diabetes mellitus type 2, hypertension, hyperlipidemia, chronic kidney disease stage IIIb, COPD, chronic osteomyelitis status post right AKA, left midfoot amputation, unspecified stroke, A-fib, chronic diastolic heart failure, mesenteric ischemia status post colostomy and recent hospitalization from 06/27/2023 to 07/11/2023 for septic shock due to ESBL UTI and multifocal pneumonia with rhinovirus infection/ acute respiratory failure with hypoxia requiring intubation and extubation/ acute encephalopathy/ acute ischemic stroke and subsequent discharge to SNF presented with severe agitation and delirium.   He was admitted to ICU and managed with Precedex.  He was transferred to [facility] service from 07/23/2023 onwards. Palliative care and psychiatry following. Currently pending SNF placement.   Assessment and Plan: Persistent encephalopathy Hyperactive delirium At baseline when discharged 07/11/2023.Patient was lucid, able to feed, and bathe himself. He lives at [facility]. His daughter makes decisions for his care. Initially admitted to ICU, required Precedex. Ammonia, VBG with no abnormalities to explain patient's presentation. CT of the head was negative for acute intracranial abnormality.    Currently no obvious medical reasons to explain delirium. Ammonia slightly elevated but likely not the cause of his delirium.  He remains sleepy this morning after receiving Seroquel last night. Continues to have delirium/agitation despite multiple antipsychotics. Psychiatry following closely. Working to reduce sedating medications which may be contributing to patient's delirium. Patient with mildly elevated ammonia, likely secondary to antipsychotics.   Regarding nutrition, patient is mostly agitated  or sedated due to antipsychotics. Per daughter patient will eat when she is around though she is not able to visit regularly as she has two small children and is a Engineer, civil (consulting). Patient pulls IV out constantly and would likely not tolerate NGT. Family hopes that he can be transitioned back to [facility] with a sitter and that his PO intake would improve. For now possible interventions are limited.    Psychiatry following, appreciate assistance. Increasing his zyprexa to 15 mg at night, keeping the PRN zyprexa and discontinuing the depakote. Patient with chronic pain primarily in right lower extremity.  On OxyContin 15 mg twice daily at [facility], on Dilaudid to 3 mg every 6 hours here. Currently requiring wrist restraints as patient pulls IV and other medical equipment. Continue CBG q6hours, encourage PO intake when awake. Pending CBG this morning. Continue thiamine    Acute hypoglycemia - Resolved  Hypoglycemia protocol. Continue CBG monitoring Q6H   Hypotension Resolved  BP remains stable with SBP in the 110s to 140s   Possible Urinary Tract Infection Finished 5 days of ceftriaxone. Does not complain of dysuria.   COPD not in acute exacerbation  Stable. Continue current nebulized regimen   HFpEF  Hypertension Hyperlipidemia Continue Coreg, hydralazine and statin.     Diabetes mellitus type 2 Well controlled. Has had some low blood sugars in the setting of poor oral intake.  No CBGs in more than 24 hours. Discussed with RN about getting CBGs every 6 hours.   Acute kidney injury CKD stage IIIb Unclear baseline, continues to improve.  Avoid nephrotoxic medicaitons.    Chronic pain syndrome Continue current regimen. On scheduled APAP and IV dilaudid.    Depression/anxiety Continue current regimen per psychiatry     Anemia of chronic disease Possibly from chronic kidney disease. No iron deficiency. Vit B12 WNL.  Hgb  stable at 9-10.   Follow-up CBC tomorrow   Thrombocytopenia   Stable. Possibly in the setting of medications. Depakote reduced per psych.  Continue to monitor.    Goals of care Palliative care and medical team had discussion with patient's daughter this p.m.  She would like patient to remain full code at this time as this was his wishes prior to his confusion.   Daughter working on getting a Engineer, site for patient when he goes back to [facility].  Discussed with CSW about disposition and [name] waiting for  improvement in his mental status.   Generalized deconditioning PT recommending SNF placement.  TOC working on placement.    Out of bed to chair. Incentive spirometry. Nursing supportive care. Fall, aspiration precautions. DVT prophylaxis   Code Status: Full Code  Subjective: Patient is seen and examined today morning. Yelling to get help out of bed. Upon requesting to stay in bed, states 'I wont fall'  Physical Exam: Vitals:   08/02/23 0917 08/02/23 1944 08/03/23 0517 08/03/23 0950  BP: (!) 134/56 135/75 115/88 130/72  Pulse: (!) 54 61 (!) 55 68  Resp: 17 20 20 17   Temp:  (!) 97.2 F (36.2 C) (!) 97.5 F (36.4 C) 98.4 F (36.9 C)  TempSrc:  Oral Oral Oral  SpO2: 98% 100% 98% 97%  Weight:   80.5 kg   Height:       General - Elderly Caucasian obese male, no apparent distress HEENT - PERRLA, EOMI, atraumatic head, non tender sinuses. Lung - Clear, diffuse rales, rhonchi, wheezes. Heart - S1, S2 heard, no murmurs, rubs, trace pedal edema. Abdomen - Soft, non tender, bowel sounds good Neuro - Alert, awake and oriented, does follow commands, non focal exam. Skin - Warm and dry. Right AKA.  Data Reviewed:      Latest Ref Rng & Units 08/03/2023    7:18 AM 08/01/2023    8:24 AM 07/31/2023    6:37 AM  CBC  WBC 4.0 - 10.5 K/uL 6.4  5.9  5.2   Hemoglobin 13.0 - 17.0 g/dL 86.5  78.4  9.3   Hematocrit 39.0 - 52.0 % 32.2  33.3  28.6   Platelets 150 - 400 K/uL 138  124  111       Latest Ref Rng & Units 08/03/2023     7:18 AM 08/01/2023    7:58 AM 07/31/2023    6:37 AM  BMP  Glucose 70 - 99 mg/dL 696  77  295   BUN 8 - 23 mg/dL 19  17  20    Creatinine 0.61 - 1.24 mg/dL 2.84  1.32  4.40   Sodium 135 - 145 mmol/L 134  133  134   Potassium 3.5 - 5.1 mmol/L 4.5  4.3  4.0   Chloride 98 - 111 mmol/L 102  102  100   CO2 22 - 32 mmol/L 22  20  21    Calcium 8.9 - 10.3 mg/dL 9.3  9.2  9.1    No results found.  Family Communication: Discussed with patient, he understand and agree. All questions answereed.  Disposition: Status is: Inpatient Remains inpatient appropriate because: altered mentation,  Planned Discharge Destination: [name] once mentation improves.   Time spent: 38 minutes. Addendum made due to wrong patient note.  Author: Marcelino Duster, MD 08/03/2023 4:59 PM Secure chat 7am to 7pm For on call review [email or web address].

## 2023-08-03 NOTE — Plan of Care (Signed)
   Problem: Safety: Goal: Non-violent Restraint(s) Outcome: Progressing   Problem: Education: Goal: Knowledge of General Education information will improve Description: Including pain rating scale, medication(s)/side effects and non-pharmacologic comfort measures Outcome: Progressing   Problem: Health Behavior/Discharge Planning: Goal: Ability to manage health-related needs will improve Outcome: Progressing   Problem: Clinical Measurements: Goal: Ability to maintain clinical measurements within normal limits will improve Outcome: Progressing Goal: Will remain free from infection Outcome: Progressing Goal: Diagnostic test results will improve Outcome: Progressing Goal: Respiratory complications will improve Outcome: Progressing Goal: Cardiovascular complication will be avoided Outcome: Progressing

## 2023-08-03 NOTE — Plan of Care (Signed)
  Problem: Nutrition: Goal: Adequate nutrition will be maintained Outcome: Progressing   Problem: Pain Managment: Goal: General experience of comfort will improve and/or be controlled Outcome: Progressing   Problem: Safety: Goal: Non-violent Restraint(s) Outcome: Not Progressing   Problem: Coping: Goal: Level of anxiety will decrease Outcome: Not Progressing

## 2023-08-04 DIAGNOSIS — Z8659 Personal history of other mental and behavioral disorders: Secondary | ICD-10-CM | POA: Diagnosis not present

## 2023-08-04 DIAGNOSIS — R410 Disorientation, unspecified: Secondary | ICD-10-CM | POA: Diagnosis not present

## 2023-08-04 DIAGNOSIS — F451 Undifferentiated somatoform disorder: Secondary | ICD-10-CM | POA: Diagnosis not present

## 2023-08-04 LAB — GLUCOSE, CAPILLARY
Glucose-Capillary: 148 mg/dL — ABNORMAL HIGH (ref 70–99)
Glucose-Capillary: 183 mg/dL — ABNORMAL HIGH (ref 70–99)
Glucose-Capillary: 191 mg/dL — ABNORMAL HIGH (ref 70–99)
Glucose-Capillary: 191 mg/dL — ABNORMAL HIGH (ref 70–99)

## 2023-08-04 NOTE — Consult Note (Signed)
[facility] Psychiatric Consult Follow-up  Patient Name: .Gregory [name]  MRN: [id number]  DOB: [birth year]  Consult Order details: [place]/severe agitation.   Mode of Visit: In person   Psychiatry Consult Evaluation  Service Date: August 04, 2023 LOS:  LOS: 19 days  Chief Complaint "I'm in pain everywhere"  Primary Psychiatric Diagnoses  Hyperactive delirium  2.  Somatic symptom disorder with predominant pain 3. History of MDD   Assessment  Gregory [name] is a 63 y.o. male admitted: Medicallyfor 07/15/2023  3:05 PM for acute agitated encephalopathy. 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   His current presentation of inattention, waxing and waning mental status, change over the last couple of days is most consistent with hyperactive delirium likely in the setting of multiple medications and uncontrolled pain. There is suspicion for somatic symptom disorder with predominant pain given chart review notable for multiple outpatient visits to chronic pain physicians and continued use of oxy throughout the years though notably nothing on PDMP. Main psychiatric diagnosis over the years has been depression/anxiety and patient and collateral denies any past psychiatric hospitalizations or suicide attempts. He reports depression but is unable to state what medications he has been on and whether or not they have helped. Current outpatient psychotropic medications from last admission included scheduled lexapro 20, oxy 15 TID, lyrica 25 TID, 100 BID, seroquel 100 at bedtime, PRN haldol 5 Q6H, oxy 5 Q6H and historically he was fully alert and mostly oriented on these medications. He was likely compliant with medications prior to admission since he was at the nursing home. On initial examination,  patient is alert, irritable, is not able to participate in sustained attention testing and is reporting pain. Will recommend decreasing seroquel, stopping klonopin, continuing depakote, making sure his pain is well controlled. I suspect that he has a low frustration tolerance with regards to his pain which contributes to agitation - was put on several anticholinergic or otherwise deliriogenic meds when in hyperactive delirium - finding right balance has been major challenge of this admission. Globally alertness and participation in PT has improved w/ reduction of medications with setback w/ recent UTI. Patient continued to wax and wane between alertness and somnolence. When alert, he would interfere with medical care requiring restraints. Given ineffectiveness of seroquel and haldol, he was started on zyprexa. Discussed with daughter. With increase in repeat ammonia and thrombocytopenia, depakote was ultimately tapered. He received zyprexa overnight but per nurse was still agitated and did not sleep. On 2/14, patient appeared somnolent after getting total zyprexa 20mg  overnight. Will plan to increase scheduled zyprexa to 15mg  and discontinue the depakote today.   08/03/2023: Patient seen face to face in his hospital room. He is awake, alert and oriented x 3. Patient reports he did not sleep well overnight because he was not feeling comfortable due to pain. But he denies suicidal or homicidal ideation, intent or plan. Patient is on soft restraints in the bilateral upper extremities because he was pulling out his colostomy bag as reported by the treating nurse. Additionally, the nurse reports that patient was agitated last night and earlier today requiring IM medication. Current medications would be adjusted as outlined in the plan section below.   08/04/2023: Patient seen in his hospital room. He was fast asleep when  this Clinical research associate entered the room and could not participate in evaluation. Collateral information from  the treating nurse indicates patient slept much better last night and benefited from the Clonazepam 1 mg at bedtime added to the current regimen. He remains intermittently agitated and would continue to monitor his response to medication adjustment.   Diagnoses:  Active Hospital problems: Principal Problem:   Acute encephalopathy Active Problems:   Malnutrition of moderate degree   Persistent hyperactive delirium due to multiple etiologies   Agitation    Plan  ## Psychiatric Medication Recommendations:  --stop seroquel 100 QHS for mood stabilization and insomnia given ineffectiveness, concern for polypharmacy --Continue Zyprexa 15 mg at bedtime for insomnia, mood stabilization, hallucinations --continue melatonin 3mg  at bedtime for insomnia, promote regular sleep-wake cycle --continue lexapro 20 daily for depression  --Continue klonopin 1 mg at bedtime for sleep/agitation  --stop depakote 500 ER at bedtime for mood stabilization-due to hyperammonemia --optimize pain regimen  -- Continue Zyprexa 5mg  PO or IM q8H PRN or severe agitation, would not give PRN ativan within 2 hours of administration due to risk of respiratory depression  -- OTO PRN ativan  ## Medical Decision Making Capacity: Not specifically addressed in this encounter  ## Further Work-up:  - VPA level 41 (albumin low 2.5 most recently) - will hold on further increases given clinical effect d/t low albumin.  --repeat ammonia level 2/13 46 -- most recent EKG on 2/13 had Qtc of 422 -- Pertinent labwork reviewed earlier this admission includes: CMP (Cr 1.52), CBC, TSH, UDS  ## Disposition:-- There are no psychiatric contraindications to discharge at this time  ## Behavioral / Environmental: -Delirium Precautions: Delirium Interventions for Nursing and Staff: - RN to open blinds every AM. - To Bedside: Glasses, hearing aide, and pt's own shoes. Make available to patients. when possible and encourage use. - Encourage po  fluids when appropriate, keep fluids within reach. - OOB to chair with meals. - Passive ROM exercises to all extremities with AM & PM care. - RN to assess orientation to person, time and place QAM and PRN. - Recommend extended visitation hours with familiar family/friends as feasible. - Staff to minimize disturbances at night. Turn off television when pt asleep or when not in use.   ## Safety and Observation Level:  - Based on my clinical evaluation, I estimate the patient to be at moderate risk of self harm in the current setting due to agitation. - At this time, we recommend  safety observation. This decision is based on my review of the chart including patient's history and current presentation, interview of the patient, mental status examination, and consideration of suicide risk including evaluating suicidal ideation, plan, intent, suicidal or self-harm behaviors, risk factors, and protective factors. This judgment is based on our ability to directly address suicide risk, implement suicide prevention strategies, and develop a safety plan while the patient is in the clinical setting. Please contact our team if there is a concern that risk level has changed.  CSSR Risk Category:C-SSRS RISK CATEGORY: No Risk  Suicide Risk Assessment: Patient has following modifiable risk factors for suicide: recklessness, which we are addressing by optimizing his medications Patient has following non-modifiable or demographic risk factors for suicide: male gender Patient has the following protective factors against suicide: Supportive family, no history of suicide attempts, and no history of NSSIB  Thank you for this consult request. Recommendations have been communicated to the primary team.  We will continue to follow at this time.  Fredonia Highland, MD                                                               History of Present Illness  Relevant Aspects of [facility] Course:  Admitted on 07/15/2023  for acute agitated encephalopathy.  -Required precedex  -Acute encephalopathy thought to be sepsis related ?AKI vs CKD  -Received 7 day course of CTX for possible UTI  ON events:  -seen by palliative, continue full scope care -continuing to require wrist restraints as he pulls at medical equipment -BP 140/79 -Received PRN zyprexa x2 (total 20mg  yesterday) -Repeat Ammonia 46  Patient Report:  Patient is seen this AM. He is somnolent and ill-appearing in his bed. He is not able to sustain attention for a formal interview. Per nursing, patient was awake all night even after getting the zyprexa. They report he was yelling all night long and continued to require restraints due to interference with medical care. Reports he is more alert in the afternoon. Nurse reports was not able to give him AM meds due to somnolence.   Psych ROS:  Depression: reports always had depression Anxiety: reports anxiety  Mania (lifetime and current): denies  Psychosis: (lifetime and current): none  Collateral information:  Called daughter [name] 2/12:  She reports patient is still not back to baseline, still appears confused. Reports he also mentioned seeing her son at the bedside but he is not there. He asks her if he's going crazy. Discussed changing seroquel to zyprexa to see if that will help with sleep. She reports he slept later even prior to all his medical issues. She also asks about thrombocytopenia and depakote.   Contacted daughter [name] at [phone] on 2/5 She reports when he was discharged from last hospitalization, he was confused but pleasant and calm. Was with him when he was admitted to hospital. States he is not himself. States his agitation beforehand is if they were taking a long time to get his medication. He was more confused and didn't understand situation. Called her this morning and was with him last night, called her at 9pm. He was asking if she was coming to pick him up. He seemed confused  about where he was last night. He seems a little more confused. Prior to admission, he was falling on the floor, trying to pee everywhere. Was not realizing where he was. On Monday nursing home told her he kept on falling in the floor. He didn't call her as he usually does. Still occasionally staring off when she was with him in the hospital. Trying to feed himself but weak. Held his pain medication. She reports she felt like he was a zombie yesterday. Sometimes need xanax when anxious. Yesterday he was telling her to clean up ice cream on floor. She thought perhaps he was bipolar. He can flip a switch really quickly, gets agitated easily. If he doesn't get what he wants he gets really mad, has always been like that. He's been a functional adult until his health declined. Struggles with anxiety and depression from his health decline. Reports only psychotropic she is aware that he used to take was lexapro before all of his medical conditions.   ROS  Unable to obtain due to patient mental status  Psychiatric  and Social History  Psychiatric History:  Information collected from patient, chart review  Prev Dx/Sx: MDD Current Psych Provider: none Home Meds (current): scheduled lexapro 20, oxy 15 TID, lyrica 25 TID, 100 BID, seroquel 100 at bedtime, PRN haldol 5 Q6H, oxy 5 Q6H Previous Med Trials: wellbutrin, cymbalta, gabapentin Therapy: unknown  Prior Psych Hospitalization: denies  Prior Self Harm: denies Prior Violence: denies  Family Psych History: denies, reports sister struggles with substance use  Family Hx suicide: unknown  Social History:  Living Situation: Lives at [facility] for several years, no access to anything else  Access to weapons/lethal means: none   Substance History Denies substance use history, UDS+opiates, BZD. Patient required ativan, versed.  -In the past he used marijuana frequently, but then became dependent on pain meds   Exam Findings  Vital Signs:  Temp:   [97.9 F (36.6 C)-98.7 F (37.1 C)] 98.5 F (36.9 C) (02/16 0508) Pulse Rate:  [58-66] 60 (02/16 0814) Resp:  [16-18] 17 (02/16 0814) BP: (100-135)/(59-112) 100/62 (02/16 0508) SpO2:  [96 %-100 %] 97 % (02/16 0814) Weight:  [80.9 kg] 80.9 kg (02/16 0500) Blood pressure 100/62, pulse 60, temperature 98.5 F (36.9 C), temperature source Oral, resp. rate 17, height 6' (1.829 m), weight 80.9 kg, SpO2 97%. Body mass index is 24.19 kg/m.  Physical Exam Constitutional:      Appearance: He is obese.  HENT:     Head: Normocephalic.  Pulmonary:     Effort: Pulmonary effort is normal.  Neurological:     Mental Status: He is alert.    Mental Status Exam:  Orientation:  to person, globally situation/location   Memory:   unable to obtain  Concentration:  Concentration: Poor  Recall:  Poor  Attention  Poor  Eye Contact:  Poor  Speech:  unable to assess  Language:  unable to assess  Volume:  unable to assess  Mood: unable to assess  Affect:   pleasant   Thought Process:  unable to obtain   Thought Content:   unable to obtain  Suicidal Thoughts:  unable to assess  Homicidal Thoughts:  unable to assess  Judgement:  unable to obtain  Insight:  unable to obtain  Psychomotor Activity:  Decreased  Akathisia:  No  Fund of Knowledge:  Unable to obtain  Denied perceptual disturbances - likely intermittent VH Assets: some ability to communicate needs  Cognition:  Impaired  ADL's:  Impaired  AIMS (if indicated):        Other History   These have been pulled in through the EMR, reviewed, and updated if appropriate.  Family History:  The patient's family history includes Bone cancer in his father; Cancer in his father, mother, paternal grandfather, and another family member; Diabetes in his mother and another family member; Heart disease in his mother; Prostate cancer in his father.  Medical History: 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)     Acquired absence of right leg below knee (HCC)    Acute osteomyelitis of calcaneum, right (HCC) 10/02/2016   Acute respiratory failure (HCC)    Amputation stump infection (HCC) 08/12/2018   Anemia    Basal cell carcinoma, eyelid    Cancer (HCC)    Candidiasis 08/12/2018   CHF (congestive heart failure) (HCC)    chronic diastolic    Chronic back pain    Chronic kidney disease    stage 3    Chronic multifocal osteomyelitis (HCC)  of ankle and foot    Chronic pain syndrome    COPD (chronic obstructive pulmonary disease) (HCC)    DDD (degenerative disc disease), lumbar    Depression    major depressive disorder    Diabetes mellitus without complication (HCC)    type 2    Diabetic ulcer of toe of left foot (HCC) 10/02/2016   Difficult intubation    Difficulty in walking, not elsewhere classified    Epidural abscess 10/02/2016   Foot drop, bilateral    Foot drop, right 12/27/2015   GERD (gastroesophageal reflux disease)    Herpesviral vesicular dermatitis    History of COVID-19 03/15/2022   Hyperlipidemia    Hyperlipidemia    Hypertension    Insomnia    Malignant melanoma of other parts of face (HCC)    Melanoma (HCC)    MRSA bacteremia    Muscle weakness (generalized)    Nasal congestion    Necrosis of toe (HCC) 07/08/2018   Neuromuscular dysfunction of bladder    Osteomyelitis (HCC)    Osteomyelitis (HCC)    Osteomyelitis (HCC)    right BKA   Other idiopathic peripheral autonomic neuropathy    Retention of urine, unspecified    Squamous cell carcinoma of skin    Stroke (HCC)    Unsteadiness on feet    Urinary retention    Urinary retention    Urinary tract infection    Wears dentures    Wears glasses     Surgical History: Past Surgical History:  Procedure Laterality Date   AMPUTATION Left 03/29/2017   Procedure: LEFT GREAT TOE AMPUTATION AT METATARSOPHALANGEAL JOINT;  Surgeon: Nadara Mustard, MD;  Location: [facility] OR;  Service: Orthopedics;  Laterality: Left;    AMPUTATION Right 03/29/2017   Procedure: RIGHT BELOW KNEE AMPUTATION;  Surgeon: Nadara Mustard, MD;  Location: [facility] OR;  Service: Orthopedics;  Laterality: Right;   AMPUTATION Left 06/06/2018   Procedure: LEFT 2ND TOE AMPUTATION;  Surgeon: Nadara Mustard, MD;  Location: [facility] OR;  Service: Orthopedics;  Laterality: Left;   AMPUTATION Right 11/19/2018   Procedure: AMPUTATION ABOVE KNEE;  Surgeon: Nadara Mustard, MD;  Location: [facility] OR;  Service: Orthopedics;  Laterality: Right;   ANTERIOR CERVICAL CORPECTOMY N/A 11/25/2015   Procedure: ANTERIOR CERVICAL FIVE CORPECTOMY Cervical four - six fusion;  Surgeon: Lisbeth Renshaw, MD;  Location: [facility];  Service: Neurosurgery;  Laterality: N/A;  ANTERIOR CERVICAL FIVE CORPECTOMY Cervical four - six fusion   APPLICATION OF WOUND VAC Right 10/22/2018   Procedure: Application Of Wound Vac;  Surgeon: Nadara Mustard, MD;  Location: [facility] OR;  Service: Orthopedics;  Laterality: Right;   CYSTOSCOPY WITH BIOPSY N/A 05/01/2022   Procedure: CYSTOSCOPY WITH URETHRAL BIOPSY;  Surgeon: Noel Christmas, MD;  Location: [facility] ORS;  Service: Urology;  Laterality: N/A;  1 HR   CYSTOSCOPY WITH RETROGRADE URETHROGRAM N/A 04/25/2018   Procedure: CYSTOSCOPY WITH RETROGRADE URETHROGRAM/ BALLOON DILATION;  Surgeon: Ihor Gully, MD;  Location: [facility] ORS;  Service: Urology;  Laterality: N/A;   CYSTOSCOPY WITH URETHRAL DILATATION N/A 05/01/2022   Procedure: BALLOON DILATION WITH  OPTILUME;  Surgeon: Noel Christmas, MD;  Location: [facility] ORS;  Service: Urology;  Laterality: N/A;   LAPAROTOMY N/A 12/03/2022   Procedure: EXPLORATORY LAPAROTOMY  total abdominal colectomy with end ileostomy;  Surgeon: Kinsinger, De Blanch, MD;  Location: [facility] OR;  Service: General;  Laterality: N/A;   MULTIPLE TOOTH EXTRACTIONS     POSTERIOR CERVICAL FUSION/FORAMINOTOMY N/A 11/29/2015  Procedure: Cervical Three-Cervical Seven Posterior Cervical Laminectomy with Fusion;  Surgeon: Lisbeth Renshaw, MD;  Location:  [facility];  Service: Neurosurgery;  Laterality: N/A;  Cervical Three-Cervical Seven Posterior Cervical Laminectomy with Fusion   STUMP REVISION Right 10/22/2018   Procedure: REVISION RIGHT BELOW KNEE AMPUTATION;  Surgeon: Nadara Mustard, MD;  Location[facility] OR;  Service: Orthopedics;  Laterality: Right;   STUMP REVISION Right 12/05/2018   Procedure: Revision Right Above Knee Amputation;  Surgeon: Nadara Mustard, MD;  Location: [facility] OR;  Service: Orthopedics;  Laterality: Right;   STUMP REVISION Right 05/29/2019   Procedure: REVISION RIGHT ABOVE KNEE AMPUTATION;  Surgeon: Nadara Mustard, MD;  Location: [facility] OR;  Service: Orthopedics;  Laterality: Right;   STUMP REVISION Right 06/24/2019   Procedure: STUMP REVISION;  Surgeon: Nadara Mustard, MD;  Location: [facility] OR;  Service: Orthopedics;  Laterality: Right;     Medications:   Current Facility-Administered Medications:    acetaminophen (TYLENOL) tablet 650 mg, 650 mg, Oral, Q6H, Lorin Glass, MD, 650 mg at 08/04/23 0622   arformoterol [facility]) nebulizer solution 15 mcg, 15 mcg, Nebulization, BID, Agarwala, Ravi, MD, 15 mcg at 08/04/23 1610   aspirin EC tablet 81 mg, 81 mg, Oral, Daily, Hunsucker, Lesia Sago, MD, 81 mg at 08/03/23 9604   atorvastatin (LIPITOR) tablet 80 mg, 80 mg, Oral, QHS, Cristopher Peru, [name], 80 mg at 08/03/23 2124   budesonide (PULMICORT) nebulizer solution 0.5 mg, 0.5 mg, Nebulization, BID, Lidia Collum, [name], 0.5 mg at 08/04/23 5409   carvedilol (COREG) tablet 12.5 mg, 12.5 mg, Oral, BID WC, Autry, Lauren E, [name], 12.5 mg at 08/03/23 1720   Chlorhexidine Gluconate Cloth 2 % PADS 6 each, 6 each, Topical, Daily, Lorin Glass, MD, 6 each at 08/03/23 1416   clonazePAM (KLONOPIN) tablet 1 mg, 1 mg, Oral, QHS, Venora Kautzman A, MD, 1 mg at 08/03/23 2124   escitalopram (LEXAPRO) tablet 20 mg, 20 mg, Oral, Daily, Autry, Lauren E, [name], 20 mg at 08/03/23 0948   famotidine (PEPCID) tablet 20 mg, 20 mg, Oral, Daily, Lidia Collum,  [name], 20 mg at 08/03/23 8119   feeding supplement (ENSURE ENLIVE / ENSURE PLUS) liquid 237 mL, 237 mL, Oral, BID BM, Hunsucker, Lesia Sago, MD, 237 mL at 08/01/23 1429   heparin injection 5,000 Units, 5,000 Units, Subcutaneous, Q8H, Lidia Collum, [name], 5,000 Units at 08/04/23 1478   hydrALAZINE (APRESOLINE) injection 10-40 mg, 10-40 mg, Intravenous, Q4H PRN, Pia Mau D, [name]   hydrALAZINE (APRESOLINE) tablet 10 mg, 10 mg, Oral, Q8H, Autry, Lauren E, [name], 10 mg at 08/03/23 2123   HYDROmorphone (DILAUDID) tablet 3 mg, 3 mg, Oral, Q6H, Marolyn Haller, MD, 3 mg at 08/04/23 2956   ipratropium-albuterol (DUONEB) 0.5-2.5 (3) MG/3ML nebulizer solution 3 mL, 3 mL, Nebulization, Q4H PRN, Suzie Portela, John D, [name]   melatonin tablet 3 mg, 3 mg, Oral, QHS, Karie Fetch, MD, 3 mg at 08/03/23 2125   multivitamin with minerals tablet 1 tablet, 1 tablet, Oral, Daily, Toni Amend W, DO, 1 tablet at 08/03/23 0948   OLANZapine zydis (ZYPREXA) disintegrating tablet 5 mg, 5 mg, Oral, Q6H PRN, 5 mg at 08/03/23 0951 **OR** OLANZapine (ZYPREXA) injection 5 mg, 5 mg, Intramuscular, Q6H PRN, Marolyn Haller, MD, 5 mg at 08/01/23 2310   OLANZapine zydis (ZYPREXA) disintegrating tablet 15 mg, 15 mg, Oral, QHS, Karie Fetch, MD, 15 mg at 08/03/23 2123   Oral care mouth rinse, 15 mL, Mouth Rinse, PRN, Ramaswamy, Murali,  MD   pantoprazole (PROTONIX) EC tablet 40 mg, 40 mg, Oral, Daily, Cherlynn Polo, Lauren E, [name], 40 mg at 08/03/23 0949   polyethylene glycol (MIRALAX / GLYCOLAX) packet 17 g, 17 g, Oral, Daily PRN, Pia Mau D, [name]   pregabalin (LYRICA) capsule 50 mg, 50 mg, Oral, BID, Marolyn Haller, MD, 50 mg at 08/03/23 2123   revefenacin (YUPELRI) nebulizer solution 175 mcg, 175 mcg, Nebulization, Daily, Lidia Collum, [name], 175 mcg at 08/04/23 1610   senna (SENOKOT) tablet 8.6 mg, 1 tablet, Oral, Daily, Marolyn Haller, MD, 8.6 mg at 08/01/23 9604   tamsulosin [facility]) capsule 0.4 mg, 0.4 mg, Oral, Daily,  Toni Amend W, DO, 0.4 mg at 08/03/23 0950   [COMPLETED] thiamine (VITAMIN B1) 500 mg in sodium chloride 0.9 % 50 mL IVPB, 500 mg, Intravenous, Q8H, Last Rate: 110 mL/hr at 07/21/23 0600, Infusion Verify at 07/21/23 0600 **FOLLOWED BY** thiamine (VITAMIN B1) tablet 100 mg, 100 mg, Oral, Daily, Lorin Glass, MD, 100 mg at 08/03/23 5409  Allergies: Allergies  Allergen Reactions   Latex Other (See Comments)    Unknown reaction    Fredonia Highland, MD, PGY-2

## 2023-08-04 NOTE — Progress Notes (Signed)
Patient removed his ileostomy bag one time this shift. Patient fell asleep after night time meds and snacks, and remained asleep through the shift.

## 2023-08-04 NOTE — Progress Notes (Signed)
Progress Note   Patient: Gregory [name] CBJ:[id number] DOB: 1960/11/12 DOA: 07/15/2023     19 DOS: the patient was seen and examined on 08/04/2023   Brief hospital course: Gregory [name] is a 63 year old male with history of diabetes mellitus type 2, hypertension, hyperlipidemia, chronic kidney disease stage IIIb, COPD, chronic osteomyelitis status post right AKA, left midfoot amputation, unspecified stroke, A-fib, chronic diastolic heart failure, mesenteric ischemia status post colostomy and recent hospitalization from 06/27/2023 to 07/11/2023 for septic shock due to ESBL UTI and multifocal pneumonia with rhinovirus infection/ acute respiratory failure with hypoxia requiring intubation and extubation/ acute encephalopathy/ acute ischemic stroke and subsequent discharge to SNF presented with severe agitation and delirium.   He was admitted to ICU and managed with Precedex.  He was transferred to [facility] service from 07/23/2023 onwards. Palliative care and psychiatry following. Currently pending SNF placement.   Assessment and Plan: Persistent encephalopathy Hyperactive delirium At baseline when discharged 07/11/2023.Patient was lucid, able to feed, and bathe himself. He lives at [facility]. His daughter makes decisions for his care. Initially admitted to ICU, required Precedex. Ammonia, VBG with no abnormalities to explain patient's presentation. CT of the head was negative for acute intracranial abnormality.    Currently no obvious medical reasons to explain delirium.  Psychiatry team follow up appreciated. Psychiatry following, appreciate assistance. Continue zyprexa to 15 mg at night and 5mg  PO or IM q8H PRN or severe agitation. Stop seroquel. Continue melatonin, lexapro. Klonopin 1mg  at bedtime for sleep. Moderate risk of self harm, continue restraints for safety. Discussed with daughter over phone.  Hypotension Resolved  BP remains stable with SBP in the 110s to 140s.   Possible Urinary Tract  Infection Finished 5 days of ceftriaxone. Does not complain of dysuria.   COPD not in acute exacerbation  Stable. Continue current nebulized regimen   HFpEF  Hypertension Hyperlipidemia Continue Coreg, hydralazine and statin.     Diabetes mellitus type 2 Hypoglycemia resolved Well controlled. Has had some low blood sugars in the setting of poor oral intake.  CBG checks q6 hr. Hypoglycemia protocol.   Acute kidney injury CKD stage IIIb Seem at baseline. Avoid nephrotoxic medicaitons.    Chronic pain syndrome Continue current regimen. On scheduled APAP and IV dilaudid.    Depression/anxiety Continue current regimen per psychiatry     Anemia of chronic disease Possibly from chronic kidney disease. No iron deficiency. Vit B12 WNL.  Hgb stable at 9-10.   Follow-up CBC tomorrow   Thrombocytopenia  Stable. Possibly in the setting of medications. Depakote reduced per psych.  Continue to monitor.    Goals of care Palliative care and medical team had discussion with patient's daughter this p.m.  She would like patient to remain full code at this time as this was his wishes prior to his confusion.   Daughter working on getting a Engineer, site for patient when he goes back to [facility].  Discussed with CSW about disposition and [name] waiting for  improvement in his mental status.   Generalized deconditioning PT recommending SNF placement.  TOC working on placement.    Out of bed to chair. Incentive spirometry. Nursing supportive care. Fall, aspiration precautions. DVT prophylaxis   Code Status: Full Code  Subjective: Patient is seen and examined today morning. He is sleeping today, did not have breakfast. RN notified that he requires restraints on and off. Discussed with daughter regarding current care.  Physical Exam: Vitals:   08/03/23 2212 08/04/23 0500 08/04/23 1607  08/04/23 0814  BP: 130/68  100/62   Pulse: 61  (!) 58 60  Resp:   16 17  Temp:   98.5 F  (36.9 C)   TempSrc:   Oral   SpO2:   96% 97%  Weight:  80.9 kg    Height:       General - Elderly Caucasian obese male, sleeping comfortable. HEENT - PERRLA, EOMI, atraumatic head, non tender sinuses. Lung - Clear, diffuse rales, rhonchi, wheezes. Heart - S1, S2 heard, no murmurs, rubs, trace pedal edema. Abdomen - Soft, non tender, bowel sounds good Neuro - Alert, awake and oriented, does follow commands, non focal exam. Skin - Warm and dry. Right AKA.  Data Reviewed:      Latest Ref Rng & Units 08/03/2023    7:18 AM 08/01/2023    8:24 AM 07/31/2023    6:37 AM  CBC  WBC 4.0 - 10.5 K/uL 6.4  5.9  5.2   Hemoglobin 13.0 - 17.0 g/dL 16.1  09.6  9.3   Hematocrit 39.0 - 52.0 % 32.2  33.3  28.6   Platelets 150 - 400 K/uL 138  124  111       Latest Ref Rng & Units 08/03/2023    7:18 AM 08/01/2023    7:58 AM 07/31/2023    6:37 AM  BMP  Glucose 70 - 99 mg/dL 045  77  409   BUN 8 - 23 mg/dL 19  17  20    Creatinine 0.61 - 1.24 mg/dL 8.11  9.14  7.82   Sodium 135 - 145 mmol/L 134  133  134   Potassium 3.5 - 5.1 mmol/L 4.5  4.3  4.0   Chloride 98 - 111 mmol/L 102  102  100   CO2 22 - 32 mmol/L 22  20  21    Calcium 8.9 - 10.3 mg/dL 9.3  9.2  9.1    No results found.  Family Communication: Discussed with daughter over phone regarding his condition, current care. All questions answereed.  Disposition: Status is: Inpatient Remains inpatient appropriate because: altered mentation,  Planned Discharge Destination: [name] once mentation improves.   Time spent: 38 minutes. Addendum made due to wrong patient note.  Author: Marcelino Duster, MD 08/04/2023 10:09 AM Secure chat 7am to 7pm For on call review [email or web address].

## 2023-08-04 NOTE — Progress Notes (Signed)
Patient remains on soft wrist restraint due to pulling out tubings and ripping off his ileostomy bag. Wrist restraints assessment done every two hours. No acute distress noted this shift. Will continue to monitor.

## 2023-08-05 DIAGNOSIS — G9340 Encephalopathy, unspecified: Secondary | ICD-10-CM | POA: Diagnosis not present

## 2023-08-05 LAB — GLUCOSE, CAPILLARY
Glucose-Capillary: 116 mg/dL — ABNORMAL HIGH (ref 70–99)
Glucose-Capillary: 130 mg/dL — ABNORMAL HIGH (ref 70–99)
Glucose-Capillary: 148 mg/dL — ABNORMAL HIGH (ref 70–99)

## 2023-08-05 MED ORDER — OXYMETAZOLINE HCL 0.05 % NA SOLN
1.0000 | Freq: Two times a day (BID) | NASAL | Status: DC
  Administered 2023-08-05 – 2023-08-07 (×4): 1 via NASAL
  Filled 2023-08-05: qty 30

## 2023-08-05 MED ORDER — HALOPERIDOL LACTATE 5 MG/ML IJ SOLN: 5.0000 mg | Freq: Once | INTRAMUSCULAR | Status: DC

## 2023-08-05 MED ORDER — ONDANSETRON HCL 4 MG/2ML IJ SOLN
4.0000 mg | Freq: Three times a day (TID) | INTRAMUSCULAR | Status: DC | PRN
Start: 2023-08-05 — End: 2023-08-05

## 2023-08-05 MED ORDER — ONDANSETRON 4 MG PO TBDP
4.0000 mg | ORAL_TABLET | Freq: Three times a day (TID) | ORAL | Status: DC | PRN
Start: 2023-08-05 — End: 2023-08-19
  Administered 2023-08-05 – 2023-08-15 (×4): 4 mg via ORAL
  Filled 2023-08-05 (×4): qty 1

## 2023-08-05 MED ORDER — HALOPERIDOL LACTATE 5 MG/ML IJ SOLN
5.0000 mg | Freq: Once | INTRAMUSCULAR | Status: AC
  Administered 2023-08-05: 5 mg via INTRAMUSCULAR
  Filled 2023-08-05: qty 1

## 2023-08-05 NOTE — Plan of Care (Signed)
  Problem: Safety: Goal: Non-violent Restraint(s) Outcome: Progressing   Problem: Clinical Measurements: Goal: Ability to maintain clinical measurements within normal limits will improve Outcome: Progressing   Problem: Nutrition: Goal: Adequate nutrition will be maintained Outcome: Progressing   Problem: Elimination: Goal: Will not experience complications related to bowel motility Outcome: Progressing

## 2023-08-05 NOTE — Progress Notes (Signed)
Physical Therapy Treatment Patient Details Name: Gregory [name] MRN: [id number] DOB: [birth year] Today's Date: 08/05/2023   History of Present Illness 63 yo male from [facility] admitted 1/27 with AMS; Acute encephalopathy/agitation delirium. recently admitted on 1/9-1/23 with septic shock and AMS secondary to ESBL UTI and Serratia/Klebsiella multifocal pneumonia/positive rhinovirus.PMH DM2 HTN HLD CKDII COPD s/p R AKA L midfoot amputation CVA Afib mesenteric ischemia s/p colostomy 12/2022 presents to [facility] ED on 1/27 with agitation delirium.    PT Comments  Progressing well towards goals which have been updated appropriately. Able to stand from bed using Stedy today, min assist. Stood multiple times from perched position at [facility] level assist with bil hands on Stedy, tolerated approx 30 sec ea, knee unblocked. Reviewed LE exercises. Eager to return to rehab and increase effort at regaining independence with mobility. Patient will continue to benefit from skilled physical therapy services to further improve independence with functional mobility. Patient will benefit from continued inpatient follow up therapy, <3 hours/day     If plan is discharge home, recommend the following: Assistance with cooking/housework;Direct supervision/assist for medications management;Direct supervision/assist for financial management;Assist for transportation;Supervision due to cognitive status;A lot of help with walking and/or transfers;A lot of help with bathing/dressing/bathroom   Can travel by private vehicle     No  Equipment Recommendations  None recommended by PT    Recommendations for Other Services       Precautions / Restrictions Precautions Precautions: Fall;Other (comment) Recall of Precautions/Restrictions: Impaired Precaution/Restrictions Comments: Colostomy on Rt Required Braces or Orthoses:  (no prosthesis here) Restrictions Weight Bearing Restrictions Per Provider Order: No Other  Position/Activity Restrictions: No weight bearing restricitions per orders or prior PT notes     Mobility  Bed Mobility Overal bed mobility: Needs Assistance Bed Mobility: Supine to Sit, Sit to Supine     Supine to sit: Min assist Sit to supine: Contact guard assist   General bed mobility comments: Impulsive to rise. Min assist to rise to EOB and use of bed pad to scoot to reduce friction. CGA to return to supine.    Transfers Overall transfer level: Needs assistance Equipment used: Ambulation equipment used Transfers: Sit to/from Stand Sit to Stand: Via lift equipment, Min assist           General transfer comment: Min assist to rise from bed utilizing Stedy to pull from. Cues for technique. Pt able to rise from Stedy paddles multple times WB through LLE no prosthesis, multiple times at CGA. Last couple of attempts required min assist due to fatigue. Transfer via Lift Equipment: Stedy  Ambulation/Gait                   Stairs             Wheelchair Mobility     Tilt Bed    Modified Rankin (Stroke Patients Only)       Balance Overall balance assessment: Needs assistance Sitting-balance support: No upper extremity supported, Feet supported Sitting balance-Leahy Scale: Fair Sitting balance - Comments: CGA EOB   Standing balance support: Bilateral upper extremity supported, Reliant on assistive device for balance Standing balance-Leahy Scale: Poor Standing balance comment: Stedy, able to stand without utilizing knee block on pad.                            Communication Communication Communication: No apparent difficulties  Cognition Arousal: Alert Behavior During Therapy: Restless, Agitated, Impulsive   PT -  Cognitive impairments: No family/caregiver present to determine baseline                         Following commands: Impaired Following commands impaired: Follows multi-step commands inconsistently    Cueing  Cueing Techniques: Verbal cues, Gestural cues  Exercises General Exercises - Lower Extremity Ankle Circles/Pumps: Strengthening, Left, 10 reps, Supine Quad Sets: Strengthening, Left, 10 reps, Supine Gluteal Sets: Strengthening, Both, 10 reps, Supine Long Arc Quad: Strengthening, Left, 10 reps, Seated Other Exercises Other Exercises: Sit to stand with Stedy x6 from perched position on paddles, CGA - Min A as he fatigued. Other Exercises: Standing balance with BIL UE support, tolerated about 30 seconds ea on Stedy, knee stable without block.    General Comments        Pertinent Vitals/Pain Pain Assessment Pain Assessment: Faces Faces Pain Scale: Hurts little more Pain Location: Buttocks Pain Descriptors / Indicators: Sore Pain Intervention(s): Monitored during session, Repositioned, Limited activity within patient's tolerance    Home Living                          Prior Function            PT Goals (current goals can now be found in the care plan section) Acute Rehab PT Goals Patient Stated Goal: Go back to SNF PT Goal Formulation: Patient unable to participate in goal setting Time For Goal Achievement: 08/19/23 Potential to Achieve Goals: Fair Progress towards PT goals: Progressing toward goals    Frequency    Min 1X/week      PT Plan      Co-evaluation              AM-PAC PT "6 Clicks" Mobility   Outcome Measure  Help needed turning from your back to your side while in a flat bed without using bedrails?: A Little Help needed moving from lying on your back to sitting on the side of a flat bed without using bedrails?: A Little Help needed moving to and from a bed to a chair (including a wheelchair)?: Total Help needed standing up from a chair using your arms (e.g., wheelchair or bedside chair)?: Total Help needed to walk in hospital room?: Total Help needed climbing 3-5 steps with a railing? : Total 6 Click Score: 10    End of Session  Equipment Utilized During Treatment: Gait belt Activity Tolerance: Patient tolerated treatment well Patient left: in bed;with call bell/phone within reach;with bed alarm set Chiropractor.) Nurse Communication: Mobility status PT Visit Diagnosis: Muscle weakness (generalized) (M62.81);Difficulty in walking, not elsewhere classified (R26.2);Other symptoms and signs involving the nervous system (R29.898)     Time: 1610-9604 PT Time Calculation (min) (ACUTE ONLY): 22 min  Charges:    $Therapeutic Activity: 8-22 mins PT General Charges $$ ACUTE PT VISIT: 1 Visit                     Kathlyn Sacramento, PT, DPT [facility] Health  Rehabilitation Services Physical Therapist Office: [phone] Website: [email or web address]    Berton Mount 08/05/2023, 4:39 PM

## 2023-08-05 NOTE — Progress Notes (Signed)
Patient was agitated at the beginning of shift, patient was holering for the first four hours. After a good bath, patient settled down. Patient tolerated meds well. No acute distress noted. Patient remains on Soil scientist for safety observation. Will Continue to monitor.

## 2023-08-05 NOTE — Progress Notes (Signed)
At 1815 RN bed alarm was going off, One of the nurses went to check on pt and pt trying to come out of bed pt able to make it over rail and Nurse had to assist pt to ground. RN's called into room, staff assisted pt back to bed. Pt assessed, no injuries noted. Vitals taken and stable. Rn notified MD of situation, pt very combative with staff. Staff concerned for safety, MD ordered wrist restraints and RN had to call security to assist with restraints being placed due to pt being combative. Pt also given PRN IM Zyprexa. RN notified pt daughter via phone.

## 2023-08-05 NOTE — Progress Notes (Signed)
   Palliative Medicine Inpatient Follow Up Note HPI:  63 y.o. 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 He was admitted to ICU and managed with Precedex.    He was hospitalized 06/27/2023 to 07/11/2023 for septic shock due to ESBL UTI and multifocal pneumonia with rhinovirus infection/acute respiratory failure with hypoxia requiring intubation and extubation/acute encephalopathy/acute ischemic stroke. Palliative care saw during the January 2025 hospitalization. The Palliative team has been asked to get involved on this admission to further establish goals of care.   Today's Discussion 08/05/2023  *Please note that this is a verbal dictation therefore any spelling or grammatical errors are due to the "Dragon Medical One" system interpretation.  Chart reviewed inclusive of vital signs, progress notes, laboratory results, and diagnostic images.   I spoke to nursing staff who shares that Gregory [name] has been disoriented trying to get out of bed and getting aggressive when told no. The nursing staff share their concern(s) in the setting of patients potential harm to himself and/or staff.   I met with Gregory [name] at bedside. He is pleasant with me though gets aggravated fairly quickly. He is aware that he is in the hospital though little else at this time.   Patients primary team and Psychiatry are working on patients complicated delirium in order to get him back to his long term bed at [facility].  Questions and concerns addressed/Palliative Support Provided.   Objective Assessment: Vital Signs Vitals:   08/05/23 0515 08/05/23 0903  BP: 115/68   Pulse: 71   Resp: 18   Temp: 98.5 F (36.9 C)   SpO2: 98% 98%    Intake/Output Summary (Last 24 hours) at  08/05/2023 1514 Last data filed at 08/05/2023 1433 Gross per 24 hour  Intake 237 ml  Output 2150 ml  Net -1913 ml   Last Weight  Most recent update: 08/05/2023  5:21 AM    Weight  80.6 kg (177 lb 11.1 oz)            Gen:  Older Caucasian M chronically ill appearing HEENT:  moist mucous membranes CV: Regular rate and rhythm  PULM:  On RA, breathing is even and nonlabored ABD: soft/nontender  EXT: RLE AKA Neuro: Awake and alert to self and aware of being in the hospital   SUMMARY OF RECOMMENDATIONS   Continue full scope care Appreciate the Psychiatry's ongoing management of patients complicated delirium Needs to be free of restraints for 48 hours Plan to transition back to [facility] once medically optimized PMT will continue to follow as needed  Billing based on MDM: Low  ______________________________________________________________________________________ Gregory [name] Palliative Medicine Team Team Cell Phone: [phone] Please utilize secure chat with additional questions, if there is no response within 30 minutes please call the above phone number  Palliative Medicine Team providers are available by phone from 7am to 7pm daily and can be reached through the team cell phone.  Should this patient require assistance outside of these hours, please call the patient's attending physician.

## 2023-08-05 NOTE — Progress Notes (Signed)
Progress Note   Patient: Gregory [name] UJW:[id number] DOB: 04/22/1961 DOA: 07/15/2023     20 DOS: the patient was seen and examined on 08/05/2023   Brief hospital course: Gregory [name] is a 63 year old male with history of diabetes mellitus type 2, hypertension, hyperlipidemia, chronic kidney disease stage IIIb, COPD, chronic osteomyelitis status post right AKA, left midfoot amputation, unspecified stroke, A-fib, chronic diastolic heart failure, mesenteric ischemia status post colostomy and recent hospitalization from 06/27/2023 to 07/11/2023 for septic shock due to ESBL UTI and multifocal pneumonia with rhinovirus infection/ acute respiratory failure with hypoxia requiring intubation and extubation/ acute encephalopathy/ acute ischemic stroke and subsequent discharge to SNF presented with severe agitation and delirium.   He was admitted to ICU and managed with Precedex.  He was transferred to [facility] service from 07/23/2023 onwards. Palliative care and psychiatry following. Currently pending SNF placement.   Assessment and Plan: Persistent encephalopathy Hyperactive delirium At baseline when discharged 07/11/2023.Patient was lucid, able to feed, and bathe himself. He lives at [facility]. His daughter makes decisions for his care. Initially admitted to ICU, required Precedex. Ammonia, VBG with no abnormalities to explain patient's presentation. CT of the head was negative for acute intracranial abnormality.    Currently no obvious medical reasons to explain delirium.  Psychiatry team follow up appreciated. Continue zyprexa to 15 mg at night and 5mg  PO or IM q8H PRN or severe agitation. Stop seroquel. Continue melatonin, lexapro. Klonopin 1mg  at bedtime for sleep. Continue Zyprexa 5mg  PO or IM q8H PRN or severe agitation. Moderate risk of self harm, continue restraints for safety.  Hypotension Resolved  BP remains stable with SBP in the 110s to 140s.   Possible Urinary Tract Infection Finished 5 days  of ceftriaxone. Does not complain of dysuria.   COPD not in acute exacerbation  Stable. Continue current nebulized regimen   HFpEF  Hypertension Hyperlipidemia Continue Coreg, hydralazine and statin.     Diabetes mellitus type 2 Hypoglycemia resolved Well controlled. Has had some low blood sugars in the setting of poor oral intake.  CBG checks q6 hr. Hypoglycemia protocol.   Acute kidney injury CKD stage IIIb Seem at baseline. Avoid nephrotoxic medicaitons.    Chronic pain syndrome Continue current regimen. On scheduled APAP and IV dilaudid.    Depression/anxiety Continue current regimen per psychiatry     Anemia of chronic disease Possibly from chronic kidney disease. No iron deficiency. Vit B12 WNL.  Hgb stable at 9-10.   Follow-up CBC tomorrow   Thrombocytopenia  Stable. Possibly in the setting of medications. Depakote reduced per psych.  Continue to monitor.    Goals of care Palliative care and medical team had discussion with patient's daughter this p.m.  She would like patient to remain full code at this time as this was his wishes prior to his confusion.   Daughter working on getting a Engineer, site for patient when he goes back to [facility] once mental status improves.    Generalized deconditioning PT recommending SNF placement.  TOC working on placement.    Out of bed to chair. Incentive spirometry. Nursing supportive care. Fall, aspiration precautions. DVT prophylaxis   Code Status: Full Code  Subjective: Patient is seen and examined today morning. He is sleepy, able to answer me appropriately. Did not eat breakfast. Off restraints on tele observer.   Physical Exam: Vitals:   08/04/23 2116 08/05/23 0515 08/05/23 0520 08/05/23 0903  BP: 128/62 115/68    Pulse: 66 71  Resp: 18 18    Temp: 98.3 F (36.8 C) 98.5 F (36.9 C)    TempSrc: Oral Oral    SpO2: 97% 98%  98%  Weight:   80.6 kg   Height:       General - Elderly Caucasian obese  male, sleeping comfortable. HEENT - PERRLA, EOMI, atraumatic head, non tender sinuses. Lung - Clear, diffuse rales, rhonchi, wheezes. Heart - S1, S2 heard, no murmurs, rubs, trace pedal edema. Abdomen - Soft, non tender, bowel sounds good Neuro - lethargic able to answer me, non focal exam. Skin - Warm and dry. Right AKA.  Data Reviewed:      Latest Ref Rng & Units 08/03/2023    7:18 AM 08/01/2023    8:24 AM 07/31/2023    6:37 AM  CBC  WBC 4.0 - 10.5 K/uL 6.4  5.9  5.2   Hemoglobin 13.0 - 17.0 g/dL 19.1  47.8  9.3   Hematocrit 39.0 - 52.0 % 32.2  33.3  28.6   Platelets 150 - 400 K/uL 138  124  111       Latest Ref Rng & Units 08/03/2023    7:18 AM 08/01/2023    7:58 AM 07/31/2023    6:37 AM  BMP  Glucose 70 - 99 mg/dL 295  77  621   BUN 8 - 23 mg/dL 19  17  20    Creatinine 0.61 - 1.24 mg/dL 3.08  6.57  8.46   Sodium 135 - 145 mmol/L 134  133  134   Potassium 3.5 - 5.1 mmol/L 4.5  4.3  4.0   Chloride 98 - 111 mmol/L 102  102  100   CO2 22 - 32 mmol/L 22  20  21    Calcium 8.9 - 10.3 mg/dL 9.3  9.2  9.1    No results found.  Family Communication: Discussed with daughter over phone yesterday regarding his condition, current care. All questions answereed.  Disposition: Status is: Inpatient Remains inpatient appropriate because: altered mentation.  Planned Discharge Destination: [name] once mentation improves.  Time spent: 37 minutes.  Author: Marcelino Duster, MD 08/05/2023 1:40 PM Secure chat 7am to 7pm For on call review [email or web address].

## 2023-08-05 NOTE — Plan of Care (Addendum)
Refused  blood sugar checks, MD notified Problem: Safety: Goal: Non-violent Restraint(s) Outcome: Progressing   Problem: Education: Goal: Knowledge of General Education information will improve Description: Including pain rating scale, medication(s)/side effects and non-pharmacologic comfort measures Outcome: Progressing   Problem: Health Behavior/Discharge Planning: Goal: Ability to manage health-related needs will improve Outcome: Progressing   Problem: Clinical Measurements: Goal: Ability to maintain clinical measurements within normal limits will improve Outcome: Progressing Goal: Will remain free from infection Outcome: Progressing Goal: Diagnostic test results will improve Outcome: Progressing Goal: Respiratory complications will improve Outcome: Progressing Goal: Cardiovascular complication will be avoided Outcome: Progressing   Problem: Activity: Goal: Risk for activity intolerance will decrease Outcome: Progressing   Problem: Nutrition: Goal: Adequate nutrition will be maintained Outcome: Progressing   Problem: Coping: Goal: Level of anxiety will decrease Outcome: Progressing   Problem: Elimination: Goal: Will not experience complications related to bowel motility Outcome: Progressing Goal: Will not experience complications related to urinary retention Outcome: Progressing   Problem: Pain Managment: Goal: General experience of comfort will improve and/or be controlled Outcome: Progressing   Problem: Safety: Goal: Ability to remain free from injury will improve Outcome: Progressing   Problem: Skin Integrity: Goal: Risk for impaired skin integrity will decrease Outcome: Progressing

## 2023-08-05 NOTE — Consult Note (Signed)
[facility] Psychiatric Consult Follow-up  Patient Name: .JEDI CATALFAMO  MRN: [id number]  DOB: [birth year]  Consult Order details: [place]/severe agitation.   Mode of Visit: In person   Psychiatry Consult Evaluation  Service Date: August 05, 2023 LOS:  LOS: 20 days  Chief Complaint "I'm in pain everywhere"  Primary Psychiatric Diagnoses  Hyperactive delirium  2.  Somatic symptom disorder with predominant pain 3. History of MDD   Assessment  NASZIR COTT is a 63 y.o. male admitted: Medicallyfor 07/15/2023  3:05 PM for acute agitated encephalopathy. 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   His current presentation of inattention, waxing and waning mental status, change over the last couple of days is most consistent with hyperactive delirium likely in the setting of multiple medications and uncontrolled pain. There is suspicion for somatic symptom disorder with predominant pain given chart review notable for multiple outpatient visits to chronic pain physicians and continued use of oxy throughout the years though notably nothing on PDMP. Main psychiatric diagnosis over the years has been depression/anxiety and patient and collateral denies any past psychiatric hospitalizations or suicide attempts. He reports depression but is unable to state what medications he has been on and whether or not they have helped. Current outpatient psychotropic medications from last admission included scheduled lexapro 20, oxy 15 TID, lyrica 25 TID, 100 BID, seroquel 100 at bedtime, PRN haldol 5 Q6H, oxy 5 Q6H and historically he was fully alert and mostly oriented on these medications. He was likely compliant with medications prior to admission since he was at the nursing home. On initial examination,  patient is alert, irritable, is not able to participate in sustained attention testing and is reporting pain. Will recommend decreasing seroquel, stopping klonopin, continuing depakote, making sure his pain is well controlled. I suspect that he has a low frustration tolerance with regards to his pain which contributes to agitation - was put on several anticholinergic or otherwise deliriogenic meds when in hyperactive delirium - finding right balance has been major challenge of this admission. Globally alertness and participation in PT has improved w/ reduction of medications with setback w/ recent UTI. Patient continued to wax and wane between alertness and somnolence. When alert, he would interfere with medical care requiring restraints. Given ineffectiveness of seroquel and haldol, he was started on zyprexa. Discussed with daughter. With increase in repeat ammonia and thrombocytopenia, depakote was ultimately tapered and discontinued. He received zyprexa but per nurse was still agitated and did not sleep. On 2/14, patient appeared somnolent after getting total zyprexa 20mg  overnight. He was also started on klonopin at bedtime to assist with agitation and sleep and appeared improved. Patient appears somnolent this AM. While initially had increased agitation overnight, he improved with behavioral intervention (bath) and getting medications. He did not require PRNs and is currently out of restraints. Will plan to continue current medication regimen.   Diagnoses:  Active Hospital problems: Principal Problem:   Acute encephalopathy Active Problems:   Malnutrition of moderate degree   Persistent hyperactive delirium due to multiple etiologies   Agitation    Plan  ## Psychiatric Medication Recommendations:  --Continue Zyprexa 15 mg at bedtime for insomnia, mood stabilization, hallucinations --continue melatonin 3mg  at bedtime for insomnia, promote regular sleep-wake cycle --continue lexapro 20 daily for  depression  --Continue klonopin 1 mg at bedtime for sleep/agitation  --optimize pain regimen  -- Continue Zyprexa 5mg  PO or IM q8H PRN or severe agitation, would not give PRN ativan within 2 hours of administration due to risk of respiratory depression  --stop depakote 500 ER at bedtime for mood stabilization-due to hyperammonemia and thrombocytopenia --stop seroquel 100 QHS for mood stabilization and insomnia given ineffectiveness, concern for polypharmacy  ## Medical Decision Making Capacity: Not specifically addressed in this encounter  ## Further Work-up:  - VPA level 41 (albumin low 2.5 most recently) - will hold on further increases given clinical effect d/t low albumin.  --repeat ammonia level 2/13 46 -- most recent EKG on 2/13 had Qtc of 422 -- Pertinent labwork reviewed earlier this admission includes: CMP (Cr 1.52), CBC, TSH, UDS  ## Disposition:-- There are no psychiatric contraindications to discharge at this time  ## Behavioral / Environmental: -Delirium Precautions: Delirium Interventions for Nursing and Staff: - RN to open blinds every AM. - To Bedside: Glasses, hearing aide, and pt's own shoes. Make available to patients. when possible and encourage use. - Encourage po fluids when appropriate, keep fluids within reach. - OOB to chair with meals. - Passive ROM exercises to all extremities with AM & PM care. - RN to assess orientation to person, time and place QAM and PRN. - Recommend extended visitation hours with familiar family/friends as feasible. - Staff to minimize disturbances at night. Turn off television when pt asleep or when not in use.   ## Safety and Observation Level:  - Based on my clinical evaluation, I estimate the patient to be at moderate risk of self harm in the current setting due to agitation. - At this time, we recommend  safety observation. This decision is based on my review of the chart including patient's history and current presentation, interview of  the patient, mental status examination, and consideration of suicide risk including evaluating suicidal ideation, plan, intent, suicidal or self-harm behaviors, risk factors, and protective factors. This judgment is based on our ability to directly address suicide risk, implement suicide prevention strategies, and develop a safety plan while the patient is in the clinical setting. Please contact our team if there is a concern that risk level has changed.  CSSR Risk Category:C-SSRS RISK CATEGORY: No Risk  Suicide Risk Assessment: Patient has following modifiable risk factors for suicide: recklessness, which we are addressing by optimizing his medications Patient has following non-modifiable or demographic risk factors for suicide: male gender Patient has the following protective factors against suicide: Supportive family, no history of suicide attempts, and no history of NSSIB  Thank you for this consult request. Recommendations have been communicated to the primary team.  We will sign off at this time.   Karie Fetch, MD, PGY-2                                                               History of Present Illness  Relevant Aspects of [facility] Course:  Admitted on 07/15/2023 for acute agitated encephalopathy.  -Required precedex  -Acute encephalopathy thought to be sepsis related ?AKI vs CKD  -Received 7 day course of CTX for possible UTI  ON events:  VSS. Took most medications, did not require PRNs overnight. Per overnight nursing  note, patient was agitated at the beginning of shift and hollering for the first 4 hours but settled down after a bath. Per nurse this AM, patient did not receive morning meds yesterday but after getting them he appeared to calm down. Reports patient is currently out of restraints.    Patient Report:  Patient is seen in his bed, appears somnolent. Unable to participate in formal interview.   Psych ROS:  Depression: reports always had  depression Anxiety: reports anxiety  Mania (lifetime and current): denies  Psychosis: (lifetime and current): none  Collateral information:  Called daughter [name] 2/12:  She reports patient is still not back to baseline, still appears confused. Reports he also mentioned seeing her son at the bedside but he is not there. He asks her if he's going crazy. Discussed changing seroquel to zyprexa to see if that will help with sleep. She reports he slept later even prior to all his medical issues. She also asks about thrombocytopenia and depakote.   Contacted daughter [name] at [phone] on 2/5 She reports when he was discharged from last hospitalization, he was confused but pleasant and calm. Was with him when he was admitted to hospital. States he is not himself. States his agitation beforehand is if they were taking a long time to get his medication. He was more confused and didn't understand situation. Called her this morning and was with him last night, called her at 9pm. He was asking if she was coming to pick him up. He seemed confused about where he was last night. He seems a little more confused. Prior to admission, he was falling on the floor, trying to pee everywhere. Was not realizing where he was. On Monday nursing home told her he kept on falling in the floor. He didn't call her as he usually does. Still occasionally staring off when she was with him in the hospital. Trying to feed himself but weak. Held his pain medication. She reports she felt like he was a zombie yesterday. Sometimes need xanax when anxious. Yesterday he was telling her to clean up ice cream on floor. She thought perhaps he was bipolar. He can flip a switch really quickly, gets agitated easily. If he doesn't get what he wants he gets really mad, has always been like that. He's been a functional adult until his health declined. Struggles with anxiety and depression from his health decline. Reports only psychotropic she is aware  that he used to take was lexapro before all of his medical conditions.   ROS  Unable to obtain due to patient mental status  Psychiatric and Social History  Psychiatric History:  Information collected from patient, chart review  Prev Dx/Sx: MDD Current Psych Provider: none Home Meds (current): scheduled lexapro 20, oxy 15 TID, lyrica 25 TID, 100 BID, seroquel 100 at bedtime, PRN haldol 5 Q6H, oxy 5 Q6H Previous Med Trials: wellbutrin, cymbalta, gabapentin Therapy: unknown  Prior Psych Hospitalization: denies  Prior Self Harm: denies Prior Violence: denies  Family Psych History: denies, reports sister struggles with substance use  Family Hx suicide: unknown  Social History:  Living Situation: Lives at [facility] for several years, no access to anything else  Access to weapons/lethal means: none   Substance History Denies substance use history, UDS+opiates, BZD. Patient required ativan, versed.  -In the past he used marijuana frequently, but then became dependent on pain meds   Exam Findings  Vital Signs:  Temp:  [97.5 F (36.4 C)-98.5 F (36.9 C)] 98.5  F (36.9 C) (02/17 0515) Pulse Rate:  [60-71] 71 (02/17 0515) Resp:  [16-18] 18 (02/17 0515) BP: (104-128)/(62-70) 115/68 (02/17 0515) SpO2:  [97 %-100 %] 98 % (02/17 0515) Weight:  [80.6 kg] 80.6 kg (02/17 0520) Blood pressure 115/68, pulse 71, temperature 98.5 F (36.9 C), temperature source Oral, resp. rate 18, height 6' (1.829 m), weight 80.6 kg, SpO2 98%. Body mass index is 24.1 kg/m.  Physical Exam Constitutional:      Appearance: He is obese.  HENT:     Head: Normocephalic.  Pulmonary:     Effort: Pulmonary effort is normal.  Neurological:     Mental Status: He is lethargic.    Mental Status Exam:  Orientation:  unable to obtain  Memory:   unable to obtain  Concentration:  Concentration: Poor  Recall:  Poor  Attention  Poor  Eye Contact:  Poor  Speech:  unable to assess  Language:  unable to  assess  Volume:  unable to assess  Mood: unable to assess  Affect:   appropriate  Thought Process:  unable to obtain   Thought Content:   unable to obtain  Suicidal Thoughts:  unable to assess  Homicidal Thoughts:  unable to assess  Judgement:  unable to obtain  Insight:  unable to obtain  Psychomotor Activity:  Decreased  Akathisia:  No  Fund of Knowledge:  Unable to obtain  Denied perceptual disturbances - likely intermittent VH Assets: some ability to communicate needs  Cognition:  Impaired  ADL's:  Impaired  AIMS (if indicated):        Other History   These have been pulled in through the EMR, reviewed, and updated if appropriate.  Family History:  The patient's family history includes Bone cancer in his father; Cancer in his father, mother, paternal grandfather, and another family member; Diabetes in his mother and another family member; Heart disease in his mother; Prostate cancer in his father.  Medical History: 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    Chronic pain syndrome    COPD (chronic obstructive pulmonary disease) (HCC)    DDD (degenerative disc disease), lumbar    Depression    major depressive disorder    Diabetes mellitus without complication (HCC)    type 2    Diabetic ulcer of toe of left foot (HCC) 10/02/2016   Difficult intubation    Difficulty in walking, not elsewhere classified    Epidural abscess 10/02/2016   Foot drop, bilateral    Foot drop,  right 12/27/2015   GERD (gastroesophageal reflux disease)    Herpesviral vesicular dermatitis    History of COVID-19 03/15/2022   Hyperlipidemia    Hyperlipidemia    Hypertension    Insomnia    Malignant melanoma of other parts of face (HCC)    Melanoma (HCC)    MRSA bacteremia    Muscle weakness (generalized)  Nasal congestion    Necrosis of toe (HCC) 07/08/2018   Neuromuscular dysfunction of bladder    Osteomyelitis (HCC)    Osteomyelitis (HCC)    Osteomyelitis (HCC)    right BKA   Other idiopathic peripheral autonomic neuropathy    Retention of urine, unspecified    Squamous cell carcinoma of skin    Stroke (HCC)    Unsteadiness on feet    Urinary retention    Urinary retention    Urinary tract infection    Wears dentures    Wears glasses     Surgical History: Past Surgical History:  Procedure Laterality Date   AMPUTATION Left 03/29/2017   Procedure: LEFT GREAT TOE AMPUTATION AT METATARSOPHALANGEAL JOINT;  Surgeon: Nadara Mustard, MD;  Location: [facility] OR;  Service: Orthopedics;  Laterality: Left;   AMPUTATION Right 03/29/2017   Procedure: RIGHT BELOW KNEE AMPUTATION;  Surgeon: Nadara Mustard, MD;  Location: [facility] OR;  Service: Orthopedics;  Laterality: Right;   AMPUTATION Left 06/06/2018   Procedure: LEFT 2ND TOE AMPUTATION;  Surgeon: Nadara Mustard, MD;  Location: [facility] OR;  Service: Orthopedics;  Laterality: Left;   AMPUTATION Right 11/19/2018   Procedure: AMPUTATION ABOVE KNEE;  Surgeon: Nadara Mustard, MD;  Location: [facility] OR;  Service: Orthopedics;  Laterality: Right;   ANTERIOR CERVICAL CORPECTOMY N/A 11/25/2015   Procedure: ANTERIOR CERVICAL FIVE CORPECTOMY Cervical four - six fusion;  Surgeon: Lisbeth Renshaw, MD;  Location: [facility];  Service: Neurosurgery;  Laterality: N/A;  ANTERIOR CERVICAL FIVE CORPECTOMY Cervical four - six fusion   APPLICATION OF WOUND VAC Right 10/22/2018   Procedure: Application Of Wound Vac;  Surgeon: Nadara Mustard, MD;  Location: [facility] OR;   Service: Orthopedics;  Laterality: Right;   CYSTOSCOPY WITH BIOPSY N/A 05/01/2022   Procedure: CYSTOSCOPY WITH URETHRAL BIOPSY;  Surgeon: Noel Christmas, MD;  Location: [facility] ORS;  Service: Urology;  Laterality: N/A;  1 HR   CYSTOSCOPY WITH RETROGRADE URETHROGRAM N/A 04/25/2018   Procedure: CYSTOSCOPY WITH RETROGRADE URETHROGRAM/ BALLOON DILATION;  Surgeon: Ihor Gully, MD;  Location: [facility] ORS;  Service: Urology;  Laterality: N/A;   CYSTOSCOPY WITH URETHRAL DILATATION N/A 05/01/2022   Procedure: BALLOON DILATION WITH  OPTILUME;  Surgeon: Noel Christmas, MD;  Location: [facility] ORS;  Service: Urology;  Laterality: N/A;   LAPAROTOMY N/A 12/03/2022   Procedure: EXPLORATORY LAPAROTOMY  total abdominal colectomy with end ileostomy;  Surgeon: Kinsinger, De Blanch, MD;  Location: [facility] OR;  Service: General;  Laterality: N/A;   MULTIPLE TOOTH EXTRACTIONS     POSTERIOR CERVICAL FUSION/FORAMINOTOMY N/A 11/29/2015   Procedure: Cervical Three-Cervical Seven Posterior Cervical Laminectomy with Fusion;  Surgeon: Lisbeth Renshaw, MD;  Location: [facility];  Service: Neurosurgery;  Laterality: N/A;  Cervical Three-Cervical Seven Posterior Cervical Laminectomy with Fusion   STUMP REVISION Right 10/22/2018   Procedure: REVISION RIGHT BELOW KNEE AMPUTATION;  Surgeon: Nadara Mustard, MD;  Location: [facility] OR;  Service: Orthopedics;  Laterality: Right;   STUMP REVISION Right 12/05/2018   Procedure: Revision Right Above Knee Amputation;  Surgeon: Nadara Mustard, MD;  Location: [facility] OR;  Service: Orthopedics;  Laterality: Right;   STUMP REVISION Right 05/29/2019   Procedure: REVISION RIGHT ABOVE KNEE AMPUTATION;  Surgeon: Nadara Mustard, MD;  Location: [facility] OR;  Service: Orthopedics;  Laterality: Right;   STUMP REVISION Right 06/24/2019   Procedure: STUMP REVISION;  Surgeon: Nadara Mustard, MD;  Location: [facility] OR;  Service: Orthopedics;  Laterality: Right;  Medications:   Current Facility-Administered Medications:     acetaminophen (TYLENOL) tablet 650 mg, 650 mg, Oral, Q6H, Lorin Glass, MD, 650 mg at 08/05/23 0558   arformoterol (BROVANA) nebulizer solution 15 mcg, 15 mcg, Nebulization, BID, Agarwala, Ravi, MD, 15 mcg at 08/04/23 2952   aspirin EC tablet 81 mg, 81 mg, Oral, Daily, Hunsucker, Lesia Sago, MD, 81 mg at 08/04/23 1358   atorvastatin (LIPITOR) tablet 80 mg, 80 mg, Oral, QHS, Autry, Lauren E, [name], 80 mg at 08/04/23 2125   budesonide (PULMICORT) nebulizer solution 0.5 mg, 0.5 mg, Nebulization, BID, Lidia Collum, [name], 0.5 mg at 08/04/23 8413   carvedilol (COREG) tablet 12.5 mg, 12.5 mg, Oral, BID WC, Autry, Lauren E, [name], 12.5 mg at 08/03/23 1720   Chlorhexidine Gluconate Cloth 2 % PADS 6 each, 6 each, Topical, Daily, Lorin Glass, MD, 6 each at 08/03/23 1416   clonazePAM (KLONOPIN) tablet 1 mg, 1 mg, Oral, QHS, Akintayo, Musa A, MD, 1 mg at 08/04/23 2125   escitalopram (LEXAPRO) tablet 20 mg, 20 mg, Oral, Daily, Autry, Lauren E, [name], 20 mg at 08/04/23 1358   famotidine (PEPCID) tablet 20 mg, 20 mg, Oral, Daily, Suzie Portela, John D, [name], 20 mg at 08/04/23 1358   feeding supplement (ENSURE ENLIVE / ENSURE PLUS) liquid 237 mL, 237 mL, Oral, BID BM, Hunsucker, Lesia Sago, MD, 237 mL at 08/01/23 1429   heparin injection 5,000 Units, 5,000 Units, Subcutaneous, Q8H, Payne, John D, [name], 5,000 Units at 08/05/23 0559   hydrALAZINE (APRESOLINE) injection 10-40 mg, 10-40 mg, Intravenous, Q4H PRN, Pia Mau D, [name]   hydrALAZINE (APRESOLINE) tablet 10 mg, 10 mg, Oral, Q8H, Autry, Lauren E, [name], 10 mg at 08/04/23 2125   HYDROmorphone (DILAUDID) tablet 3 mg, 3 mg, Oral, Q6H, Marolyn Haller, MD, 3 mg at 08/05/23 0558   ipratropium-albuterol (DUONEB) 0.5-2.5 (3) MG/3ML nebulizer solution 3 mL, 3 mL, Nebulization, Q4H PRN, Pia Mau D, [name]   melatonin tablet 3 mg, 3 mg, Oral, QHS, Karie Fetch, MD, 3 mg at 08/04/23 2125   multivitamin with minerals tablet 1 tablet, 1 tablet, Oral, Daily, Toni Amend  W, DO, 1 tablet at 08/04/23 1358   OLANZapine zydis (ZYPREXA) disintegrating tablet 5 mg, 5 mg, Oral, Q6H PRN, 5 mg at 08/03/23 0951 **OR** OLANZapine (ZYPREXA) injection 5 mg, 5 mg, Intramuscular, Q6H PRN, Marolyn Haller, MD, 5 mg at 08/01/23 2310   OLANZapine zydis (ZYPREXA) disintegrating tablet 15 mg, 15 mg, Oral, QHS, Karie Fetch, MD, 15 mg at 08/04/23 2125   Oral care mouth rinse, 15 mL, Mouth Rinse, PRN, Kalman Shan, MD   pantoprazole (PROTONIX) EC tablet 40 mg, 40 mg, Oral, Daily, Autry, Lauren E, [name], 40 mg at 08/04/23 1358   polyethylene glycol (MIRALAX / GLYCOLAX) packet 17 g, 17 g, Oral, Daily PRN, Suzie Portela, John D, [name]   pregabalin (LYRICA) capsule 50 mg, 50 mg, Oral, BID, Marolyn Haller, MD, 50 mg at 08/04/23 2125   revefenacin (YUPELRI) nebulizer solution 175 mcg, 175 mcg, Nebulization, Daily, Lidia Collum, [name], 175 mcg at 08/04/23 2440   senna (SENOKOT) tablet 8.6 mg, 1 tablet, Oral, Daily, Marolyn Haller, MD, 8.6 mg at 08/01/23 1027   tamsulosin [facility]) capsule 0.4 mg, 0.4 mg, Oral, Daily, Toni Amend W, DO, 0.4 mg at 08/04/23 1358   [COMPLETED] thiamine (VITAMIN B1) 500 mg in sodium chloride 0.9 % 50 mL IVPB, 500 mg, Intravenous, Q8H, Last Rate: 110 mL/hr at 07/21/23 0600, Infusion Verify at 07/21/23 0600 **FOLLOWED BY** thiamine (  VITAMIN B1) tablet 100 mg, 100 mg, Oral, Daily, Lorin Glass, MD, 100 mg at 08/04/23 1358  Allergies: Allergies  Allergen Reactions   Latex Other (See Comments)    Unknown reaction    Karie Fetch, MD, PGY-2

## 2023-08-05 NOTE — TOC Progression Note (Signed)
Transition of Care [facility]) - Progression Note    Patient Details  Name: Gregory [name] MRN: [id number] Date of Birth: 1961/01/18  Transition of Care [facility]) CM/SW Contact  Keela Rubert A [place], LCSW Phone Number: 08/05/2023, 1:12 PM  Clinical Narrative:      CSW spoke with Tammy at [facility] about pt returning to facility. She stated that pt did recognize her and was able to name his daughter and Interior and spatial designer at the facility. She said though pt seemed more oriented, he cannot return until his telesitter, sitter and mittens are Dc'd  for 48hrs. She said once that has occurred, she will review pt for readiness to return to [facility].    TOC will continue to follow.       Expected Discharge Plan and Services                                               Social Determinants of Health (SDOH) Interventions SDOH Screenings   Food Insecurity: No Food Insecurity (07/20/2023)  Housing: Low Risk  (07/20/2023)  Transportation Needs: No Transportation Needs (07/20/2023)  Utilities: Not At Risk (07/20/2023)  Depression (PHQ2-9): Low Risk  (03/08/2021)  Tobacco Use: High Risk (07/29/2023)    Readmission Risk Interventions    09/06/2022    1:48 PM 08/27/2022   12:46 PM 05/03/2022   11:53 AM  Readmission Risk Prevention Plan  Transportation Screening Complete Complete Complete  Medication Review Oceanographer) Complete Complete Complete  PCP or Specialist appointment within 3-5 days of discharge Complete Complete Complete  HRI or Home Care Consult Complete Complete Complete  SW Recovery Care/Counseling Consult Complete Complete Complete  Palliative Care Screening Not Applicable Not Applicable Not Applicable  Skilled Nursing Facility Complete Complete Complete

## 2023-08-06 DIAGNOSIS — G9340 Encephalopathy, unspecified: Secondary | ICD-10-CM | POA: Diagnosis not present

## 2023-08-06 LAB — GLUCOSE, CAPILLARY
Glucose-Capillary: 92 mg/dL (ref 70–99)
Glucose-Capillary: 117 mg/dL — ABNORMAL HIGH (ref 70–99)
Glucose-Capillary: 117 mg/dL — ABNORMAL HIGH (ref 70–99)
Glucose-Capillary: 102 mg/dL — ABNORMAL HIGH (ref 70–99)

## 2023-08-06 MED ORDER — OXCARBAZEPINE 150 MG PO TABS
150.0000 mg | ORAL_TABLET | Freq: Two times a day (BID) | ORAL | Status: DC
  Administered 2023-08-06 – 2023-08-18 (×24): 150 mg via ORAL
  Filled 2023-08-06 (×28): qty 1

## 2023-08-06 NOTE — Progress Notes (Signed)
   Palliative Medicine Inpatient Follow Up Note HPI:  63 y.o. 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 He was admitted to ICU and managed with Precedex.    He was hospitalized 06/27/2023 to 07/11/2023 for septic shock due to ESBL UTI and multifocal pneumonia with rhinovirus infection/acute respiratory failure with hypoxia requiring intubation and extubation/acute encephalopathy/acute ischemic stroke. Palliative care saw during the January 2025 hospitalization. The Palliative team has been asked to get involved on this admission to further establish goals of care.   Today's Discussion 08/06/2023  *Please note that this is a verbal dictation therefore any spelling or grammatical errors are due to the "Dragon [employer] One" system interpretation.  Chart reviewed inclusive of vital signs, progress notes, laboratory results, and diagnostic images.   I met with Osamu at bedside this afternoon. He was sleeping after having received some sedating medications. He exemplifies no nonverbal signs of distress at the time of assessment.   Per patients RN his behaviors are better today.   Questions and concerns addressed/Palliative Support Provided.   Objective Assessment: Vital Signs Vitals:   08/06/23 0836 08/06/23 1300  BP: 131/77 110/69  Pulse: 90   Resp: 17   Temp: 98 F (36.7 C)   SpO2: 100%     Intake/Output Summary (Last 24 hours) at 08/06/2023 1514 Last data filed at 08/06/2023 0347 Gross per 24 hour  Intake 60 ml  Output 700 ml  Net -640 ml   Last Weight  Most recent update: 08/06/2023  5:39 AM    Weight  78.6 kg (173 lb 4.5 oz)            Gen:  Older Caucasian M chronically ill appearing HEENT:  moist mucous membranes CV: Regular  rate and rhythm  PULM:  On RA, breathing is even and nonlabored ABD: soft/nontender  EXT: RLE AKA Neuro: Awake and alert to self and aware of being in the hospital   SUMMARY OF RECOMMENDATIONS   Continue full scope care Appreciate the Psychiatry's ongoing management of patients complicated delirium Needs to be free of restraints for 48 hours Plan to transition back to [facility] once medically optimized PMT will continue to follow as needed  Billing based on MDM: Low  ______________________________________________________________________________________ Lamarr Lulas Lawler Palliative Medicine Team Team Cell Phone: [phone] Please utilize secure chat with additional questions, if there is no response within 30 minutes please call the above phone number  Palliative Medicine Team providers are available by phone from 7am to 7pm daily and can be reached through the team cell phone.  Should this patient require assistance outside of these hours, please call the patient's attending physician.

## 2023-08-06 NOTE — Plan of Care (Signed)
  Problem: Education: Goal: Knowledge of General Education information will improve Description: Including pain rating scale, medication(s)/side effects and non-pharmacologic comfort measures Outcome: Progressing   Problem: Clinical Measurements: Goal: Ability to maintain clinical measurements within normal limits will improve Outcome: Progressing Goal: Will remain free from infection Outcome: Progressing Goal: Diagnostic test results will improve Outcome: Progressing Goal: Respiratory complications will improve Outcome: Progressing Goal: Cardiovascular complication will be avoided Outcome: Progressing   Problem: Activity: Goal: Risk for activity intolerance will decrease Outcome: Progressing   Problem: Nutrition: Goal: Adequate nutrition will be maintained Outcome: Progressing   Problem: Elimination: Goal: Will not experience complications related to bowel motility Outcome: Progressing Goal: Will not experience complications related to urinary retention Outcome: Progressing   Problem: Pain Managment: Goal: General experience of comfort will improve and/or be controlled Outcome: Progressing   Problem: Skin Integrity: Goal: Risk for impaired skin integrity will decrease Outcome: Progressing

## 2023-08-06 NOTE — Consult Note (Signed)
[facility] Psychiatric Consult Follow-up  Patient Name: .Gregory [name]  MRN: [id number]  DOB: [birth year]  Consult Order details: [place]/severe agitation.   Mode of Visit: In person   Psychiatry Consult Evaluation  Service Date: August 06, 2023 LOS:  LOS: 21 days  Chief Complaint "I'm in pain everywhere"  Primary Psychiatric Diagnoses  Hyperactive delirium  2.  Somatic symptom disorder with predominant pain 3. History of MDD   Assessment  Gregory [name] is a 63 y.o. male admitted: Medicallyfor 07/15/2023  3:05 PM for acute agitated encephalopathy. 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   His current presentation of inattention, waxing and waning mental status, change over the last couple of days is most consistent with hyperactive delirium likely in the setting of multiple medications and uncontrolled pain. There is suspicion for somatic symptom disorder with predominant pain given chart review notable for multiple outpatient visits to chronic pain physicians and continued use of oxy throughout the years though notably nothing on PDMP. Main psychiatric diagnosis over the years has been depression/anxiety and patient and collateral denies any past psychiatric hospitalizations or suicide attempts. He reports depression but is unable to state what medications he has been on and whether or not they have helped. Current outpatient psychotropic medications from last admission included scheduled lexapro 20, oxy 15 TID, lyrica 25 TID, 100 BID, seroquel 100 at bedtime, PRN haldol 5 Q6H, oxy 5 Q6H and historically he was fully alert and mostly oriented on these medications. He was likely compliant with medications prior to admission since he was at the nursing home. On initial examination,  patient is alert, irritable, is not able to participate in sustained attention testing and is reporting pain. Will recommend decreasing seroquel, stopping klonopin, continuing depakote, making sure his pain is well controlled. I suspect that he has a low frustration tolerance with regards to his pain which contributes to agitation - was put on several anticholinergic or otherwise deliriogenic meds when in hyperactive delirium - finding right balance has been major challenge of this admission. Globally alertness and participation in PT has improved w/ reduction of medications with setback w/ recent UTI. Patient continued to wax and wane between alertness and somnolence. When alert, he would interfere with medical care requiring restraints. Given ineffectiveness of seroquel and haldol, he was started on zyprexa. Discussed with daughter. With increase in repeat ammonia and thrombocytopenia, depakote was ultimately tapered and discontinued. He received zyprexa but per nurse was still agitated and did not sleep. On 2/14, patient appeared somnolent after getting total zyprexa 20mg  overnight. He was also started on klonopin at bedtime to assist with agitation and sleep and appeared improved. Patient appears somnolent this AM. While initially had increased agitation overnight, he improved with behavioral intervention (bath) and getting medications. He did not require PRNs and is currently out of restraints.   Unfortunately, patient required restraints in the evening after attempting to get out of bed again. Patient's mental status continues to wax and wane. Patient unable to consent to medication however discussed with daughter starting trileptal to help with mood stabilization. She was in agreement.  Will plan to continue current medication regimen.   Diagnoses:  Active Hospital problems: Principal Problem:   Acute encephalopathy Active Problems:   Malnutrition of moderate degree  Persistent hyperactive  delirium due to multiple etiologies   Agitation    Plan  ## Psychiatric Medication Recommendations:  --Continue Zyprexa 15 mg at bedtime for insomnia, mood stabilization, hallucinations --continue melatonin 3mg  at bedtime for insomnia, promote regular sleep-wake cycle --continue lexapro 20 daily for depression  --Continue klonopin 1 mg at bedtime for sleep/agitation  --Start oxcarbazepine 150mg  BID for mood stabilization, irritability, agitation  Continue to monitor Na for hyponatremia --optimize pain regimen  -- Continue Zyprexa 5mg  PO or IM q8H PRN or severe agitation, would not give PRN ativan within 2 hours of administration due to risk of respiratory depression  --stop depakote 500 ER at bedtime for mood stabilization-due to hyperammonemia and thrombocytopenia --stop seroquel 100 QHS for mood stabilization and insomnia given ineffectiveness, concern for polypharmacy  ## Medical Decision Making Capacity: Not specifically addressed in this encounter  ## Further Work-up:  - VPA level 41 (albumin low 2.5 most recently) - will hold on further increases given clinical effect d/t low albumin.  --repeat ammonia level 2/13 46 -- most recent EKG on 2/13 had Qtc of 422 -- Pertinent labwork reviewed earlier this admission includes: CMP (Cr 1.52), CBC, TSH, UDS  ## Disposition:-- There are no psychiatric contraindications to discharge at this time  ## Behavioral / Environmental: -Delirium Precautions: Delirium Interventions for Nursing and Staff: - RN to open blinds every AM. - To Bedside: Glasses, hearing aide, and pt's own shoes. Make available to patients. when possible and encourage use. - Encourage po fluids when appropriate, keep fluids within reach. - OOB to chair with meals. - Passive ROM exercises to all extremities with AM & PM care. - RN to assess orientation to person, time and place QAM and PRN. - Recommend extended visitation hours with familiar family/friends as feasible. - Staff  to minimize disturbances at night. Turn off television when pt asleep or when not in use.   ## Safety and Observation Level:  - Based on my clinical evaluation, I estimate the patient to be at moderate risk of self harm in the current setting due to agitation. - At this time, we recommend safety observation. This decision is based on my review of the chart including patient's history and current presentation, interview of the patient, mental status examination, and consideration of suicide risk including evaluating suicidal ideation, plan, intent, suicidal or self-harm behaviors, risk factors, and protective factors. This judgment is based on our ability to directly address suicide risk, implement suicide prevention strategies, and develop a safety plan while the patient is in the clinical setting. Please contact our team if there is a concern that risk level has changed.  CSSR Risk Category:C-SSRS RISK CATEGORY: No Risk  Suicide Risk Assessment: Patient has following modifiable risk factors for suicide: recklessness, which we are addressing by optimizing his medications Patient has following non-modifiable or demographic risk factors for suicide: male gender Patient has the following protective factors against suicide: Supportive family, no history of suicide attempts, and no history of NSSIB  Thank you for this consult request. Recommendations have been communicated to the primary team.  We will continue to follow at this time.   Karie Fetch, MD, PGY-2  History of Present Illness  Relevant Aspects of [facility] Course:  Admitted on 07/15/2023 for acute agitated encephalopathy.  -Required precedex  -Acute encephalopathy thought to be sepsis related ?AKI vs CKD  -Received 7 day course of CTX for possible UTI  ON events:  VSS. Patient had an assisted fall out of bed, combative with staff. Had to be placed back into wrist  restraints. He was given PRN IM zyprexa. Also given PO zyprexa at 2am. Nurse reports that patient was still agitated and yelling overnight but fell asleep an hour before day shift. She reports zyprexa helped a little.   Patient Report:  Patient is seen in his bed, he is lethargic but awakens to voice. He is able to state his name. He is able to state his location, [facility]. He is able to state why he is in the hospital "confused." He is also able to state the year. When asked about the month, he repeats the year. He is unable to state the events of why he was trying to get out of the bed yesterday. He reports that he has pain in his leg. When asked if this was different than normal he reports it is the same. He is unable to sustain attention for continued interview. Denies SI/HI/AVH.   Psych ROS:  Depression: reports always had depression Anxiety: reports anxiety  Mania (lifetime and current): denies  Psychosis: (lifetime and current): none  Collateral information:  Contacted daughter [name] 2/18 She reports patient has had increased agitation, reports he had increased agitation even towards her yesterday. She reports he had increased confusion, was talking about her uncle that died before she was born. Was asking for pain meds even though he just received pain meds. Discussed starting trileptal for mood stabilization, she reports she is aware of the medication and is open to starting this.   Called daughter [name] 2/12:  She reports patient is still not back to baseline, still appears confused. Reports he also mentioned seeing her son at the bedside but he is not there. He asks her if he's going crazy. Discussed changing seroquel to zyprexa to see if that will help with sleep. She reports he slept later even prior to all his medical issues. She also asks about thrombocytopenia and depakote.   Contacted daughter [name] at [phone] on 2/5 She reports when he was discharged from last  hospitalization, he was confused but pleasant and calm. Was with him when he was admitted to hospital. States he is not himself. States his agitation beforehand is if they were taking a long time to get his medication. He was more confused and didn't understand situation. Called her this morning and was with him last night, called her at 9pm. He was asking if she was coming to pick him up. He seemed confused about where he was last night. He seems a little more confused. Prior to admission, he was falling on the floor, trying to pee everywhere. Was not realizing where he was. On Monday nursing home told her he kept on falling in the floor. He didn't call her as he usually does. Still occasionally staring off when she was with him in the hospital. Trying to feed himself but weak. Held his pain medication. She reports she felt like he was a zombie yesterday. Sometimes need xanax when anxious. Yesterday he was telling her to clean up ice cream on floor. She thought perhaps he was bipolar. He can flip a switch really quickly, gets agitated  easily. If he doesn't get what he wants he gets really mad, has always been like that. He's been a functional adult until his health declined. Struggles with anxiety and depression from his health decline. Reports only psychotropic she is aware that he used to take was lexapro before all of his medical conditions.   ROS  Reports pain in his leg  Psychiatric and Social History  Psychiatric History:  Information collected from patient, chart review  Prev Dx/Sx: MDD Current Psych Provider: none Home Meds (current): scheduled lexapro 20, oxy 15 TID, lyrica 25 TID, 100 BID, seroquel 100 at bedtime, PRN haldol 5 Q6H, oxy 5 Q6H Previous Med Trials: wellbutrin, cymbalta, gabapentin Therapy: unknown  Prior Psych Hospitalization: denies  Prior Self Harm: denies Prior Violence: denies  Family Psych History: denies, reports sister struggles with substance use  Family Hx  suicide: unknown  Social History:  Living Situation: Lives at [facility] for several years, no access to anything else  Access to weapons/lethal means: none   Substance History Denies substance use history, UDS+opiates, BZD. Patient required ativan, versed.  -In the past he used marijuana frequently, but then became dependent on pain meds   Exam Findings  Vital Signs:  Temp:  [98.4 F (36.9 C)-98.7 F (37.1 C)] 98.4 F (36.9 C) (02/18 0519) Pulse Rate:  [66-82] 82 (02/18 0519) Resp:  [16-18] 18 (02/18 0519) BP: (114-164)/(59-80) 114/67 (02/18 0519) SpO2:  [96 %-99 %] 98 % (02/18 0519) Weight:  [78.6 kg] 78.6 kg (02/18 0500) Blood pressure 114/67, pulse 82, temperature 98.4 F (36.9 C), temperature source Oral, resp. rate 18, height 6' (1.829 m), weight 78.6 kg, SpO2 98%. Body mass index is 23.5 kg/m.  Physical Exam Constitutional:      Appearance: He is obese.  HENT:     Head: Normocephalic.  Pulmonary:     Effort: Pulmonary effort is normal.  Neurological:     Mental Status: He is lethargic.    Mental Status Exam:  Orientation:  oriented to self, year, reason in hospital  Memory:   poor  Concentration:  Concentration: Poor  Recall:  Poor  Attention  Poor  Eye Contact:  Poor  Speech:  clear  Language:  fair  Volume:  normal  Mood: "in pain"  Affect:   appropriate  Thought Process:  poverty of thought  Thought Content:   reports in pain, denies SI/HI/AVH  Suicidal Thoughts:  denies  Homicidal Thoughts:  denies  Judgement:  poor  Insight:  limited  Psychomotor Activity:  Increased  Akathisia:  No  Fund of Knowledge:    Denied perceptual disturbances - likely intermittent VH Assets: some ability to communicate needs  Cognition:  Impaired  ADL's:  Impaired  AIMS (if indicated):        Other History   These have been pulled in through the EMR, reviewed, and updated if appropriate.  Family History:  The patient's family history includes Bone cancer in  his father; Cancer in his father, mother, paternal grandfather, and another family member; Diabetes in his mother and another family member; Heart disease in his mother; Prostate cancer in his father.  Medical History: 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)    Acquired absence of right leg below knee (HCC)    Acute osteomyelitis of calcaneum, right (HCC) 10/02/2016   Acute respiratory failure (HCC)    Amputation stump infection (HCC) 08/12/2018   Anemia    Basal cell carcinoma,  eyelid    Cancer (HCC)    Candidiasis 08/12/2018   CHF (congestive heart failure) (HCC)    chronic diastolic    Chronic back pain    Chronic kidney disease    stage 3    Chronic multifocal osteomyelitis (HCC)    of ankle and foot    Chronic pain syndrome    COPD (chronic obstructive pulmonary disease) (HCC)    DDD (degenerative disc disease), lumbar    Depression    major depressive disorder    Diabetes mellitus without complication (HCC)    type 2    Diabetic ulcer of toe of left foot (HCC) 10/02/2016   Difficult intubation    Difficulty in walking, not elsewhere classified    Epidural abscess 10/02/2016   Foot drop, bilateral    Foot drop, right 12/27/2015   GERD (gastroesophageal reflux disease)    Herpesviral vesicular dermatitis    History of COVID-19 03/15/2022   Hyperlipidemia    Hyperlipidemia    Hypertension    Insomnia    Malignant melanoma of other parts of face (HCC)    Melanoma (HCC)    MRSA bacteremia    Muscle weakness (generalized)    Nasal congestion    Necrosis of toe (HCC) 07/08/2018   Neuromuscular dysfunction of bladder    Osteomyelitis (HCC)    Osteomyelitis (HCC)    Osteomyelitis (HCC)    right BKA   Other idiopathic peripheral autonomic neuropathy    Retention of urine, unspecified    Squamous cell carcinoma of skin    Stroke (HCC)    Unsteadiness on feet    Urinary retention    Urinary retention    Urinary tract  infection    Wears dentures    Wears glasses     Surgical History: Past Surgical History:  Procedure Laterality Date   AMPUTATION Left 03/29/2017   Procedure: LEFT GREAT TOE AMPUTATION AT METATARSOPHALANGEAL JOINT;  Surgeon: Nadara Mustard, MD;  Location: [facility] OR;  Service: Orthopedics;  Laterality: Left;   AMPUTATION Right 03/29/2017   Procedure: RIGHT BELOW KNEE AMPUTATION;  Surgeon: Nadara Mustard, MD;  Location: [facility] OR;  Service: Orthopedics;  Laterality: Right;   AMPUTATION Left 06/06/2018   Procedure: LEFT 2ND TOE AMPUTATION;  Surgeon: Nadara Mustard, MD;  Location: [facility] OR;  Service: Orthopedics;  Laterality: Left;   AMPUTATION Right 11/19/2018   Procedure: AMPUTATION ABOVE KNEE;  Surgeon: Nadara Mustard, MD;  Location: [facility] OR;  Service: Orthopedics;  Laterality: Right;   ANTERIOR CERVICAL CORPECTOMY N/A 11/25/2015   Procedure: ANTERIOR CERVICAL FIVE CORPECTOMY Cervical four - six fusion;  Surgeon: Lisbeth Renshaw, MD;  Location: [facility];  Service: Neurosurgery;  Laterality: N/A;  ANTERIOR CERVICAL FIVE CORPECTOMY Cervical four - six fusion   APPLICATION OF WOUND VAC Right 10/22/2018   Procedure: Application Of Wound Vac;  Surgeon: Nadara Mustard, MD;  Location: [facility] OR;  Service: Orthopedics;  Laterality: Right;   CYSTOSCOPY WITH BIOPSY N/A 05/01/2022   Procedure: CYSTOSCOPY WITH URETHRAL BIOPSY;  Surgeon: Noel Christmas, MD;  Location: [facility] ORS;  Service: Urology;  Laterality: N/A;  1 HR   CYSTOSCOPY WITH RETROGRADE URETHROGRAM N/A 04/25/2018   Procedure: CYSTOSCOPY WITH RETROGRADE URETHROGRAM/ BALLOON DILATION;  Surgeon: Ihor Gully, MD;  Location: [facility] ORS;  Service: Urology;  Laterality: N/A;   CYSTOSCOPY WITH URETHRAL DILATATION N/A 05/01/2022   Procedure: BALLOON DILATION WITH  OPTILUME;  Surgeon: Noel Christmas, MD;  Location: [facility] ORS;  Service: Urology;  Laterality: N/A;   LAPAROTOMY N/A 12/03/2022   Procedure: EXPLORATORY LAPAROTOMY  total abdominal colectomy with end ileostomy;   Surgeon: Kinsinger, De Blanch, MD;  Location: [facility] OR;  Service: General;  Laterality: N/A;   MULTIPLE TOOTH EXTRACTIONS     POSTERIOR CERVICAL FUSION/FORAMINOTOMY N/A 11/29/2015   Procedure: Cervical Three-Cervical Seven Posterior Cervical Laminectomy with Fusion;  Surgeon: Lisbeth Renshaw, MD;  Location: [facility];  Service: Neurosurgery;  Laterality: N/A;  Cervical Three-Cervical Seven Posterior Cervical Laminectomy with Fusion   STUMP REVISION Right 10/22/2018   Procedure: REVISION RIGHT BELOW KNEE AMPUTATION;  Surgeon: Nadara Mustard, MD;  Location[facility] OR;  Service: Orthopedics;  Laterality: Right;   STUMP REVISION Right 12/05/2018   Procedure: Revision Right Above Knee Amputation;  Surgeon: Nadara Mustard, MD;  Location: [facility] OR;  Service: Orthopedics;  Laterality: Right;   STUMP REVISION Right 05/29/2019   Procedure: REVISION RIGHT ABOVE KNEE AMPUTATION;  Surgeon: Nadara Mustard, MD;  Location: [facility] OR;  Service: Orthopedics;  Laterality: Right;   STUMP REVISION Right 06/24/2019   Procedure: STUMP REVISION;  Surgeon: Nadara Mustard, MD;  Location: [facility] OR;  Service: Orthopedics;  Laterality: Right;     Medications:   Current Facility-Administered Medications:    acetaminophen (TYLENOL) tablet 650 mg, 650 mg, Oral, Q6H, Lorin Glass, MD, 650 mg at 08/06/23 0517   arformoterol (BROVANA) nebulizer solution 15 mcg, 15 mcg, Nebulization, BID, Agarwala, Ravi, MD, 15 mcg at 08/05/23 2244   aspirin EC tablet 81 mg, 81 mg, Oral, Daily, Hunsucker, Lesia Sago, MD, 81 mg at 08/05/23 1003   atorvastatin (LIPITOR) tablet 80 mg, 80 mg, Oral, QHS, Autry, Lauren E, [name], 80 mg at 08/05/23 2145   budesonide (PULMICORT) nebulizer solution 0.5 mg, 0.5 mg, Nebulization, BID, Lidia Collum, [name], 0.5 mg at 08/05/23 2244   carvedilol (COREG) tablet 12.5 mg, 12.5 mg, Oral, BID WC, Autry, Lauren E, [name], 12.5 mg at 08/05/23 1705   Chlorhexidine Gluconate Cloth 2 % PADS 6 each, 6 each, Topical, Daily, Lorin Glass, MD, 6 each at 08/05/23 1529   clonazePAM (KLONOPIN) tablet 1 mg, 1 mg, Oral, QHS, Akintayo, Musa A, MD, 1 mg at 08/05/23 2146   escitalopram (LEXAPRO) tablet 20 mg, 20 mg, Oral, Daily, Autry, Lauren E, [name], 20 mg at 08/05/23 1003   famotidine (PEPCID) tablet 20 mg, 20 mg, Oral, Daily, Suzie Portela, John D, [name], 20 mg at 08/05/23 1003   feeding supplement (ENSURE ENLIVE / ENSURE PLUS) liquid 237 mL, 237 mL, Oral, BID BM, Hunsucker, Lesia Sago, MD, 237 mL at 08/05/23 1530   heparin injection 5,000 Units, 5,000 Units, Subcutaneous, Q8H, Lidia Collum, [name], 5,000 Units at 08/06/23 0981   hydrALAZINE (APRESOLINE) injection 10-40 mg, 10-40 mg, Intravenous, Q4H PRN, Pia Mau D, [name]   hydrALAZINE (APRESOLINE) tablet 10 mg, 10 mg, Oral, Q8H, Autry, Lauren E, [name], 10 mg at 08/06/23 0519   HYDROmorphone (DILAUDID) tablet 3 mg, 3 mg, Oral, Q6H, Marolyn Haller, MD, 3 mg at 08/06/23 0515   ipratropium-albuterol (DUONEB) 0.5-2.5 (3) MG/3ML nebulizer solution 3 mL, 3 mL, Nebulization, Q4H PRN, Pia Mau D, [name], 3 mL at 08/05/23 1914   melatonin tablet 3 mg, 3 mg, Oral, QHS, Karie Fetch, MD, 3 mg at 08/05/23 2146   multivitamin with minerals tablet 1 tablet, 1 tablet, Oral, Daily, Toni Amend W, DO, 1 tablet at 08/05/23 1003   OLANZapine zydis (ZYPREXA) disintegrating tablet 5 mg, 5 mg, Oral, Q6H PRN, 5 mg  at 08/06/23 0222 **OR** OLANZapine (ZYPREXA) injection 5 mg, 5 mg, Intramuscular, Q6H PRN, Marolyn Haller, MD, 5 mg at 08/05/23 1839   OLANZapine zydis (ZYPREXA) disintegrating tablet 15 mg, 15 mg, Oral, QHS, Karie Fetch, MD, 15 mg at 08/05/23 2145   ondansetron (ZOFRAN-ODT) disintegrating tablet 4 mg, 4 mg, Oral, Q8H PRN, Marcelino Duster, MD, 4 mg at 08/05/23 1706   Oral care mouth rinse, 15 mL, Mouth Rinse, PRN, Kalman Shan, MD   oxymetazoline (AFRIN) 0.05 % nasal spray 1 spray, 1 spray, Each Nare, BID, Marcelino Duster, MD, 1 spray at 08/05/23 2200   pantoprazole  (PROTONIX) EC tablet 40 mg, 40 mg, Oral, Daily, Autry, Lauren E, [name], 40 mg at 08/05/23 1003   polyethylene glycol (MIRALAX / GLYCOLAX) packet 17 g, 17 g, Oral, Daily PRN, Suzie Portela, John D, [name]   pregabalin (LYRICA) capsule 50 mg, 50 mg, Oral, BID, Marolyn Haller, MD, 50 mg at 08/05/23 2145   revefenacin (YUPELRI) nebulizer solution 175 mcg, 175 mcg, Nebulization, Daily, Lidia Collum, [name], 175 mcg at 08/04/23 1610   senna (SENOKOT) tablet 8.6 mg, 1 tablet, Oral, Daily, Marolyn Haller, MD, 8.6 mg at 08/05/23 1003   tamsulosin (FLOMAX) capsule 0.4 mg, 0.4 mg, Oral, Daily, Calkins, Derek W, DO, 0.4 mg at 08/05/23 1003   [COMPLETED] thiamine (VITAMIN B1) 500 mg in sodium chloride 0.9 % 50 mL IVPB, 500 mg, Intravenous, Q8H, Last Rate: 110 mL/hr at 07/21/23 0600, Infusion Verify at 07/21/23 0600 **FOLLOWED BY** thiamine (VITAMIN B1) tablet 100 mg, 100 mg, Oral, Daily, Lorin Glass, MD, 100 mg at 08/05/23 1003  Allergies: Allergies  Allergen Reactions   Latex Other (See Comments)    Unknown reaction    Karie Fetch, MD, PGY-2

## 2023-08-06 NOTE — Progress Notes (Signed)
Progress Note   Patient: Gregory [name]:[id number] DOB: 23-Mar-1961 DOA: 07/15/2023     21 DOS: the patient was seen and examined on 08/06/2023   Brief hospital course: Gregory [name] is a 63 year old male with history of diabetes mellitus type 2, hypertension, hyperlipidemia, chronic kidney disease stage IIIb, COPD, chronic osteomyelitis status post right AKA, left midfoot amputation, unspecified stroke, A-fib, chronic diastolic heart failure, mesenteric ischemia status post colostomy and recent hospitalization from 06/27/2023 to 07/11/2023 for septic shock due to ESBL UTI and multifocal pneumonia with rhinovirus infection/ acute respiratory failure with hypoxia requiring intubation and extubation/ acute encephalopathy/ acute ischemic stroke and subsequent discharge to SNF presented with severe agitation and delirium.   He was admitted to ICU and managed with Precedex.  He was transferred to [facility] service from 07/23/2023 onwards. Palliative care and psychiatry following. Currently pending SNF placement once mental status better.  He is intermittently agitates, delirious requiring restraints, haldol. Psych team following him and making medication changes.  Assessment and Plan: Persistent encephalopathy Hyperactive delirium At baseline when discharged 07/11/2023.Patient was lucid, able to feed, and bathe himself. He lives at [facility]. His daughter makes decisions for his care. Initially admitted to ICU, required Precedex. Ammonia, VBG with no abnormalities to explain patient's presentation. CT of the head was negative for acute intracranial abnormality. Currently no obvious medical reasons to explain delirium.   Psychiatry team follow up appreciated. Continue zyprexa to 15 mg at night and 5mg  PO or IM q8H PRN or severe agitation. Started oxcarbazepine 150mg  BID for mood stabilization, irritability, agitation. Stopped seroquel, depakote. Continue melatonin, lexapro. Klonopin 1mg  at bedtime for  sleep. He continues to be at risk of self harm, continue restraints for safety.   Possible Urinary Tract Infection Finished 5 days of ceftriaxone.    COPD not in acute exacerbation  Stable. Continue current nebulized regimen   HFpEF  Hypertension Hyperlipidemia Hypotension Resolved  BP remains stable with SBP in the 110s to 140s. Continue Coreg, hydralazine and statin.     Diabetes mellitus type 2 Hypoglycemia resolved Well controlled. Has had some low blood sugars in the setting of poor oral intake.  CBG checks per hypoglycemia protocol.   Acute kidney injury CKD stage IIIb Kidney function at baseline. Avoid nephrotoxic medicaitons.    Chronic pain syndrome Continue current regimen. On scheduled APAP and IV dilaudid.    Depression/anxiety Continue current regimen per psychiatry     Anemia of chronic disease Possibly from chronic kidney disease. No iron deficiency. Vit B12 WNL.  Hgb stable at 9-10.   Follow-up CBC tomorrow   Thrombocytopenia  Stable. Possibly in the setting of medications. Depakote reduced per psych.  Continue to monitor.    Goals of care Palliative care and medical team had discussion with patient's daughter. She would like patient to remain full code at this time as this was his wishes prior to his confusion.   Daughter wishes to get a Engineer, site for patient once he goes back to [facility].    Generalized deconditioning PT recommending SNF placement.  Back to [facility] if mental status better. TOC aware.    Nursing supportive care. Fall, aspiration precautions. DVT prophylaxis   Code Status: Full Code  Subjective: Patient is seen and examined today morning. He is sleepy this morning. Last evening, was very agitated, confused required restraints. Complained of nausea got zofran yesterday.  Physical Exam: Vitals:   08/06/23 0500 08/06/23 0519 08/06/23 0836 08/06/23 1300  BP:  114/67  131/77 110/69  Pulse:  82 90   Resp:  18 17    Temp:  98.4 F (36.9 C) 98 F (36.7 C)   TempSrc:  Oral Oral   SpO2:  98% 100%   Weight: 78.6 kg     Height:       General - Elderly Caucasian obese male, sleeping comfortable. HEENT - PERRLA, EOMI, atraumatic head, non tender sinuses. Lung - Clear, diffuse rales, rhonchi, wheezes. Heart - S1, S2 heard, no murmurs, rubs, trace pedal edema. Abdomen - Soft, non tender, bowel sounds good Neuro - sleepy, unable to follow commands, unable to do full neuro exam. Skin - Warm and dry. Right AKA.  Data Reviewed:      Latest Ref Rng & Units 08/03/2023    7:18 AM 08/01/2023    8:24 AM 07/31/2023    6:37 AM  CBC  WBC 4.0 - 10.5 K/uL 6.4  5.9  5.2   Hemoglobin 13.0 - 17.0 g/dL 09.8  11.9  9.3   Hematocrit 39.0 - 52.0 % 32.2  33.3  28.6   Platelets 150 - 400 K/uL 138  124  111       Latest Ref Rng & Units 08/03/2023    7:18 AM 08/01/2023    7:58 AM 07/31/2023    6:37 AM  BMP  Glucose 70 - 99 mg/dL 147  77  829   BUN 8 - 23 mg/dL 19  17  20    Creatinine 0.61 - 1.24 mg/dL 5.62  1.30  8.65   Sodium 135 - 145 mmol/L 134  133  134   Potassium 3.5 - 5.1 mmol/L 4.5  4.3  4.0   Chloride 98 - 111 mmol/L 102  102  100   CO2 22 - 32 mmol/L 22  20  21    Calcium 8.9 - 10.3 mg/dL 9.3  9.2  9.1    No results found.  Family Communication: Discussed with daughter over phone yesterday regarding his condition and care. All questions answereed.  Disposition: Status is: Inpatient Remains inpatient appropriate because: altered mentation, delirium  Planned Discharge Destination: [name] once mentation improves.  Time spent: 37 minutes.  Author: Marcelino Duster, MD 08/06/2023 2:22 PM Secure chat 7am to 7pm For on call review [email or web address].

## 2023-08-06 NOTE — Plan of Care (Signed)
  Problem: Safety: Goal: Non-violent Restraint(s) Outcome: Progressing   Problem: Clinical Measurements: Goal: Ability to maintain clinical measurements within normal limits will improve Outcome: Progressing Goal: Will remain free from infection Outcome: Progressing Goal: Diagnostic test results will improve Outcome: Progressing Goal: Respiratory complications will improve Outcome: Progressing Goal: Cardiovascular complication will be avoided Outcome: Progressing   Problem: Coping: Goal: Level of anxiety will decrease Outcome: Progressing   Problem: Elimination: Goal: Will not experience complications related to bowel motility Outcome: Progressing Goal: Will not experience complications related to urinary retention Outcome: Progressing   Problem: Pain Managment: Goal: General experience of comfort will improve and/or be controlled Outcome: Progressing   Problem: Safety: Goal: Ability to remain free from injury will improve Outcome: Progressing   Problem: Skin Integrity: Goal: Risk for impaired skin integrity will decrease Outcome: Progressing

## 2023-08-07 LAB — GLUCOSE, CAPILLARY
Glucose-Capillary: 107 mg/dL — ABNORMAL HIGH (ref 70–99)
Glucose-Capillary: 108 mg/dL — ABNORMAL HIGH (ref 70–99)
Glucose-Capillary: 113 mg/dL — ABNORMAL HIGH (ref 70–99)
Glucose-Capillary: 143 mg/dL — ABNORMAL HIGH (ref 70–99)
Glucose-Capillary: 209 mg/dL — ABNORMAL HIGH (ref 70–99)

## 2023-08-07 MED ORDER — ALUM & MAG HYDROXIDE-SIMETH 200-200-20 MG/5ML PO SUSP
30.0000 mL | ORAL | Status: DC | PRN
  Administered 2023-08-07: 30 mL via ORAL
  Filled 2023-08-07: qty 30

## 2023-08-07 NOTE — Progress Notes (Signed)
OT Cancellation Note  Patient Details Name: Gregory [name] MRN: [id number] DOB: 03-20-1961   Cancelled Treatment:    Reason Eval/Treat Not Completed: Fatigue/lethargy limiting ability to participate (recieved zyprexa this AM, RN reports pt very drowsy at this time. Will follow up later for OT tx as appropriate/schedule permits)  Carver Fila, OTD, OTR/L SecureChat Preferred Acute Rehab [phone]   Dalphine Handing 08/07/2023, 9:17 AM

## 2023-08-07 NOTE — Progress Notes (Signed)
Occupational Therapy Treatment Patient Details Name: Gregory [name] MRN: [id number] DOB: 06/07/1961 Today's Date: 08/07/2023   History of present illness 63 yo male from [facility] admitted 1/27 with AMS; Acute encephalopathy/agitation delirium. recently admitted on 1/9-1/23 with septic shock and AMS secondary to ESBL UTI and Serratia/Klebsiella multifocal pneumonia/positive rhinovirus.PMH DM2 HTN HLD CKDII COPD s/p R AKA L midfoot amputation CVA Afib mesenteric ischemia s/p colostomy 12/2022 presents to [facility] ED on 1/27 with agitation delirium.   OT comments  Pt not progressing toward goals this session, limited by decr participation. Pt yelling out for "pickles" upon arrival, able to self feed pickles at bed level, declines sitting EOB however is able to pull self up in bed from tren[place]berg position. Pt with decr attention, needs mod cues to recall how to dial out on hospital phone. Pt presenting with impairments listed below, will follow acutely. Patient will benefit from continued inpatient follow up therapy, <3 hours/day to maximize safety/ind with ADL/functional mobility.       If plan is discharge home, recommend the following:  Two people to help with walking and/or transfers;Two people to help with bathing/dressing/bathroom;Assistance with cooking/housework;Assistance with feeding;Direct supervision/assist for medications management;Direct supervision/assist for financial management;Assist for transportation;Help with stairs or ramp for entrance   Equipment Recommendations  Other (comment) (defer)    Recommendations for Other Services      Precautions / Restrictions Precautions Precautions: Fall;Other (comment) Recall of Precautions/Restrictions: Impaired Precaution/Restrictions Comments: Colostomy on Rt Restrictions Other Position/Activity Restrictions: No weight bearing restricitions per orders or prior PT notes       Mobility Bed Mobility Overal bed mobility: Needs  Assistance   Rolling: Mod assist         General bed mobility comments: pt able to pull self up in bed from [place] position    Transfers                   General transfer comment: pt declines     Balance                                           ADL either performed or assessed with clinical judgement   ADL Overall ADL's : Needs assistance/impaired Eating/Feeding: Set up;Sitting Eating/Feeding Details (indicate cue type and reason): self-feeding pickles                                        Extremity/Trunk Assessment Upper Extremity Assessment Upper Extremity Assessment: Generalized weakness   Lower Extremity Assessment Lower Extremity Assessment: Defer to PT evaluation        Vision   Additional Comments: L eye blind per chart review, difficulty locating items on L, i.e. food tray and call light   Perception Perception Perception: Not tested   Praxis Praxis Praxis: Not tested   Communication Communication Communication: No apparent difficulties   Cognition Arousal: Alert Behavior During Therapy: Restless, Agitated, Impulsive Cognition: No family/caregiver present to determine baseline             OT - Cognition Comments: pt yelling out "pickles" and "ice" during session instead of calling to request. difficulty recalling how to dial out on phone, ex dial "9" then phone number  Following commands: Impaired        Cueing   Cueing Techniques: Verbal cues, Gestural cues  Exercises      Shoulder Instructions       General Comments VSS    Pertinent Vitals/ Pain       Pain Assessment Pain Assessment: Faces Pain Score: 4  Faces Pain Scale: Hurts little more Pain Location: RLE Pain Descriptors / Indicators: Discomfort Pain Intervention(s): Limited activity within patient's tolerance, Monitored during session, Repositioned  Home Living                                           Prior Functioning/Environment              Frequency  Min 1X/week        Progress Toward Goals  OT Goals(current goals can now be found in the care plan section)  Progress towards OT goals: Not progressing toward goals - comment (decr motivation/participation)  Acute Rehab OT Goals OT Goal Formulation: With patient Time For Goal Achievement: 08/21/23 Potential to Achieve Goals: Good ADL Goals Pt Will Perform Eating: with supervision;sitting Pt Will Perform Grooming: with supervision;sitting Pt Will Perform Upper Body Dressing: with contact guard assist;sitting Additional ADL Goal #1: Pt will sit with CGA x 10 minutes in preparation for ADLs. Additional ADL Goal #2: Pt will be demonstrate sustained attention as a precursor to ADLs.  Plan      Co-evaluation                 AM-PAC OT "6 Clicks" Daily Activity     Outcome Measure   Help from another person eating meals?: A Little Help from another person taking care of personal grooming?: A Little Help from another person toileting, which includes using toliet, bedpan, or urinal?: Total Help from another person bathing (including washing, rinsing, drying)?: A Lot Help from another person to put on and taking off regular upper body clothing?: A Lot Help from another person to put on and taking off regular lower body clothing?: A Lot 6 Click Score: 13    End of Session    OT Visit Diagnosis: Muscle weakness (generalized) (M62.81);Other symptoms and signs involving cognitive function   Activity Tolerance Patient tolerated treatment well   Patient Left in bed;with call bell/phone within reach;with bed alarm set;with restraints reapplied (bil wrist restriants)   Nurse Communication Mobility status        Time: 2952-8413 OT Time Calculation (min): 20 min  Charges: OT General Charges $OT Visit: 1 Visit OT Treatments $Self Care/Home Management : 8-22 mins  Gregory [name], OTD,  OTR/L SecureChat Preferred Acute Rehab [phone]   Gregory [name] 08/07/2023, 2:15 PM

## 2023-08-07 NOTE — Plan of Care (Signed)
  Problem: Safety: Goal: Ability to remain free from injury will improve Outcome: Progressing   Problem: Pain Managment: Goal: General experience of comfort will improve and/or be controlled Outcome: Progressing   Problem: Elimination: Goal: Will not experience complications related to bowel motility Outcome: Progressing

## 2023-08-07 NOTE — Progress Notes (Signed)
PROGRESS NOTE    Gregory [name]  ZOX:[id number] DOB: 02-13-1961 DOA: 07/15/2023 PCP: [name], Provider, MD     Brief Narrative:  Gregory [name] [place] is a 63 year old male with history of diabetes mellitus type 2, hypertension, hyperlipidemia, chronic kidney disease stage IIIb, COPD, chronic osteomyelitis status post right AKA, left midfoot amputation, unspecified stroke, A-fib, chronic diastolic heart failure, mesenteric ischemia status post colostomy and recent hospitalization from 06/27/2023 to 07/11/2023 for septic shock due to ESBL UTI and multifocal pneumonia with rhinovirus infection/ acute respiratory failure with hypoxia requiring intubation and extubation/ acute encephalopathy/ acute ischemic stroke and subsequent discharge to SNF presented with severe agitation and delirium.    He was admitted to ICU and managed with Precedex.  He was transferred to [facility] service from 07/23/2023 onwards. Palliative care and psychiatry following. Currently pending SNF placement once mental status better.   He is intermittently agitates, delirious requiring restraints, haldol. Psych team following him and making medication changes.  New events last 24 hours / Subjective: No acute events reported. Patient somnolent this morning.   Assessment & Plan:   Principal Problem:   Acute encephalopathy Active Problems:   Malnutrition of moderate degree   Persistent hyperactive delirium due to multiple etiologies   Agitation   Persistent encephalopathy Hyperactive delirium At baseline when discharged 07/11/2023: Patient was lucid, able to feed, and bathe himself. He lives at [facility]. His daughter makes decisions for his care. Initially admitted to ICU, required Precedex. Ammonia, VBG with no abnormalities to explain patient's presentation. CT of the head was negative for acute intracranial abnormality. Currently no obvious medical reasons to explain delirium.    Psychiatry team follow up appreciated. Continue  zyprexa to 15 mg at night and 5mg  PO or IM q8H PRN or severe agitation. Started oxcarbazepine 150mg  BID for mood stabilization, irritability, agitation. Stopped seroquel, depakote. Continue melatonin, lexapro. Klonopin 1mg  at bedtime for sleep. He continues to be at risk of self harm, continue restraints for safety.   Possible Urinary Tract Infection Finished 5 days of ceftriaxone.    COPD not in acute exacerbation  Stable. Continue current nebulized regimen   HFpEF  Hypertension Hyperlipidemia Hypotension Resolved  BP remains stable Continue Coreg, hydralazine and statin.     Diabetes mellitus type 2 Hypoglycemia resolved Well controlled. Has had some low blood sugars in the setting of poor oral intake.  CBG checks per hypoglycemia protocol.   Acute kidney injury CKD stage IIIb Kidney function at baseline. Avoid nephrotoxic medicaitons.    Chronic pain syndrome Continue current regimen. On scheduled APAP and IV dilaudid.    Depression/anxiety Continue current regimen per psychiatry     Anemia of chronic disease Possibly from chronic kidney disease. No iron deficiency. Vit B12 WNL.  Hgb stable    Thrombocytopenia  Stable. Possibly in the setting of medications. Depakote reduced per psych.    Goals of care Palliative care and medical team had discussion with patient's daughter. She would like patient to remain full code at this time as this was his wishes prior to his confusion.   Daughter wishes to get a Engineer, site for patient once he goes back to [facility].    Generalized deconditioning PT recommending SNF placement.  Back to [facility] if mental status better. TOC aware.    DVT prophylaxis:  heparin injection 5,000 Units Start: 07/16/23 0600  Code Status: Full Family Communication: None at bedside Disposition Plan: SNF Status is: Inpatient Remains inpatient appropriate because: delirium.  He needs  to be out of his mittens and telemetry  sitter/sitter discontinued for 48 hours prior to discharge.    Antimicrobials:  Anti-infectives (From admission, onward)    Start     Dose/Rate Route Frequency Ordered Stop   07/26/23 1530  sulfamethoxazole-trimethoprim (BACTRIM DS) 800-160 MG per tablet 1 tablet  Status:  Discontinued        1 tablet Oral Every 12 hours 07/26/23 1439 07/26/23 1439   07/26/23 1500  cefTRIAXone (ROCEPHIN) 1 g in sodium chloride 0.9 % 100 mL IVPB        1 g 200 mL/hr over 30 Minutes Intravenous Every 24 hours 07/26/23 1440 07/31/23 1459   07/16/23 2200  vancomycin (VANCOREADY) IVPB 750 mg/150 mL  Status:  Discontinued        750 mg 150 mL/hr over 60 Minutes Intravenous Every 12 hours 07/16/23 0739 07/16/23 1243   07/16/23 2200  vancomycin (VANCOCIN) 750 mg in sodium chloride 0.9 % 250 mL IVPB  Status:  Discontinued       Note to Pharmacy: Indication: Sepsis   750 mg 265 mL/hr over 60 Minutes Intravenous Every 12 hours 07/16/23 1243 07/17/23 0821   07/16/23 0800  vancomycin (VANCOREADY) IVPB 1750 mg/350 mL        1,750 mg 175 mL/hr over 120 Minutes Intravenous  Once 07/16/23 0705 07/16/23 1137   07/16/23 0800  piperacillin-tazobactam (ZOSYN) IVPB 3.375 g  Status:  Discontinued        3.375 g 12.5 mL/hr over 240 Minutes Intravenous Every 8 hours 07/16/23 0705 07/17/23 0821        Objective: Vitals:   08/06/23 2100 08/07/23 0420 08/07/23 0423 08/07/23 0836  BP: (!) 128/91 (!) 148/69  109/75  Pulse: 60 63  (!) 57  Resp: 18 18  17   Temp: 98.1 F (36.7 C) 98.4 F (36.9 C)  98 F (36.7 C)  TempSrc: Oral Oral  Oral  SpO2: 100% 99%  98%  Weight:   80.1 kg   Height:        Intake/Output Summary (Last 24 hours) at 08/07/2023 1135 Last data filed at 08/07/2023 1130 Gross per 24 hour  Intake 140 ml  Output 1425 ml  Net -1285 ml   Filed Weights   08/05/23 0520 08/06/23 0500 08/07/23 0423  Weight: 80.6 kg 78.6 kg 80.1 kg    Examination:  General exam: Appears somnolent but  comfortable   Data Reviewed: I have personally reviewed following labs and imaging studies  CBC: Recent Labs  Lab 08/01/23 0824 08/03/23 0718  WBC 5.9 6.4  HGB 10.5* 10.3*  HCT 33.3* 32.2*  MCV 91.5 91.0  PLT 124* 138*   Basic Metabolic Panel: Recent Labs  Lab 08/01/23 0758 08/03/23 0718  NA 133* 134*  K 4.3 4.5  CL 102 102  CO2 20* 22  GLUCOSE 77 106*  BUN 17 19  CREATININE 1.61* 1.61*  CALCIUM 9.2 9.3  MG 1.7 1.5*  PHOS 3.4 4.2   GFR: Estimated Creatinine Clearance: 50.9 mL/min (A) (by C-G formula based on SCr of 1.61 mg/dL (H)). Liver Function Tests: Recent Labs  Lab 08/03/23 0718  AST 19  ALT 13  ALKPHOS 71  BILITOT 0.5  PROT 6.7  ALBUMIN 2.9*   No results for input(s): "LIPASE", "AMYLASE" in the last 168 hours. Recent Labs  Lab 08/01/23 1244  AMMONIA 46*   Coagulation Profile: No results for input(s): "INR", "PROTIME" in the last 168 hours. Cardiac Enzymes: No results for input(s): "CKTOTAL", "CKMB", "CKMBINDEX", "  TROPONINI" in the last 168 hours. BNP (last 3 results) No results for input(s): "PROBNP" in the last 8760 hours. HbA1C: No results for input(s): "HGBA1C" in the last 72 hours. CBG: Recent Labs  Lab 08/06/23 1555 08/06/23 2047 08/07/23 0005 08/07/23 0633 08/07/23 0833  GLUCAP 117* 102* 108* 143* 113*   Lipid Profile: No results for input(s): "CHOL", "HDL", "LDLCALC", "TRIG", "CHOLHDL", "LDLDIRECT" in the last 72 hours. Thyroid Function Tests: No results for input(s): "TSH", "T4TOTAL", "FREET4", "T3FREE", "THYROIDAB" in the last 72 hours. Anemia Panel: No results for input(s): "VITAMINB12", "FOLATE", "FERRITIN", "TIBC", "IRON", "RETICCTPCT" in the last 72 hours. Sepsis Labs: No results for input(s): "PROCALCITON", "LATICACIDVEN" in the last 168 hours.  No results found for this or any previous visit (from the past 240 hours).    Radiology Studies: No results found.    Scheduled Meds:  acetaminophen  650 mg Oral Q6H    arformoterol  15 mcg Nebulization BID   aspirin EC  81 mg Oral Daily   atorvastatin  80 mg Oral QHS   budesonide (PULMICORT) nebulizer solution  0.5 mg Nebulization BID   carvedilol  12.5 mg Oral BID WC   Chlorhexidine Gluconate Cloth  6 each Topical Daily   clonazePAM  1 mg Oral QHS   escitalopram  20 mg Oral Daily   famotidine  20 mg Oral Daily   feeding supplement  237 mL Oral BID BM   heparin  5,000 Units Subcutaneous Q8H   hydrALAZINE  10 mg Oral Q8H   HYDROmorphone  3 mg Oral Q6H   melatonin  3 mg Oral QHS   multivitamin with minerals  1 tablet Oral Daily   OLANZapine zydis  15 mg Oral QHS   OXcarbazepine  150 mg Oral BID   oxymetazoline  1 spray Each Nare BID   pantoprazole  40 mg Oral Daily   pregabalin  50 mg Oral BID   revefenacin  175 mcg Nebulization Daily   senna  1 tablet Oral Daily   tamsulosin  0.4 mg Oral Daily   thiamine  100 mg Oral Daily   Continuous Infusions:   LOS: 22 days   Time spent: 25 minutes   Noralee Stain, DO [facility] 08/07/2023, 11:35 AM   Available via Epic secure chat 7am-7pm After these hours, please refer to coverage provider listed on [email or web address]

## 2023-08-08 DIAGNOSIS — G9340 Encephalopathy, unspecified: Secondary | ICD-10-CM | POA: Diagnosis not present

## 2023-08-08 DIAGNOSIS — R4182 Altered mental status, unspecified: Secondary | ICD-10-CM | POA: Diagnosis not present

## 2023-08-08 LAB — GLUCOSE, CAPILLARY
Glucose-Capillary: 112 mg/dL — ABNORMAL HIGH (ref 70–99)
Glucose-Capillary: 182 mg/dL — ABNORMAL HIGH (ref 70–99)
Glucose-Capillary: 173 mg/dL — ABNORMAL HIGH (ref 70–99)
Glucose-Capillary: 116 mg/dL — ABNORMAL HIGH (ref 70–99)

## 2023-08-08 MED ORDER — OLANZAPINE 10 MG PO TABS
10.0000 mg | ORAL_TABLET | Freq: Every day | ORAL | Status: DC
  Administered 2023-08-08 – 2023-08-17 (×8): 10 mg via ORAL
  Filled 2023-08-08 (×10): qty 1

## 2023-08-08 MED ORDER — OLANZAPINE 10 MG IM SOLR
5.0000 mg | Freq: Every day | INTRAMUSCULAR | Status: DC | PRN
  Administered 2023-08-09 – 2023-08-18 (×4): 5 mg via INTRAMUSCULAR
  Filled 2023-08-08 (×7): qty 10

## 2023-08-08 MED ORDER — OLANZAPINE 5 MG PO TBDP
5.0000 mg | ORAL_TABLET | Freq: Every day | ORAL | Status: DC | PRN
  Administered 2023-08-10 – 2023-08-13 (×3): 5 mg via ORAL
  Filled 2023-08-08 (×3): qty 1

## 2023-08-08 MED ORDER — MAGNESIUM OXIDE -MG SUPPLEMENT 400 (240 MG) MG PO TABS
400.0000 mg | ORAL_TABLET | Freq: Every day | ORAL | Status: DC
  Administered 2023-08-08 – 2023-08-18 (×10): 400 mg via ORAL
  Filled 2023-08-08 (×11): qty 1

## 2023-08-08 NOTE — Progress Notes (Signed)
   Palliative Medicine Inpatient Follow Up Note HPI:  63 y.o. 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 He was admitted to ICU and managed with Precedex.    He was hospitalized 06/27/2023 to 07/11/2023 for septic shock due to ESBL UTI and multifocal pneumonia with rhinovirus infection/acute respiratory failure with hypoxia requiring intubation and extubation/acute encephalopathy/acute ischemic stroke. Palliative care saw during the January 2025 hospitalization. The Palliative team has been asked to get involved on this admission to further establish goals of care.   Today's Discussion 08/08/2023  *Please note that this is a verbal dictation therefore any spelling or grammatical errors are due to the "[employer]" system interpretation.  Chart reviewed inclusive of vital signs, progress notes, laboratory results, and diagnostic images.   I spoke to patients RN and NT this morning. Aquan had a busy night and apparently was awake for the majority of it. He has still required restraints and antipsychotics.   On assessment, Kiril is sleeping given that he was up throughout the night.   Psychiatry and primary team remain to work on patients delirium management.   Plan for [facility] once medically optimized from a behavioral perspective.   Questions and concerns addressed/Palliative Support Provided.   Objective Assessment: Vital Signs Vitals:   08/08/23 0809 08/08/23 1000  BP: 107/66   Pulse: (!) 51 (!) 58  Resp: 17   Temp: 98.5 F (36.9 C)   SpO2: 98%     Intake/Output Summary (Last 24 hours) at 08/08/2023 1109 Last data filed at 08/08/2023 1032 Gross per 24 hour  Intake 354 ml  Output 1100 ml  Net -746 ml   Last Weight   Most recent update: 08/07/2023  4:24 AM    Weight  80.1 kg (176 lb 9.4 oz)            Gen:  Older Caucasian M chronically ill appearing HEENT:  moist mucous membranes CV: Regular rate and rhythm  PULM:  On RA, breathing is even and nonlabored ABD: soft/nontender  EXT: RLE AKA Neuro: Awake and alert to self and aware of being in the hospital   SUMMARY OF RECOMMENDATIONS   Full Code / Full scope care Appreciate the Psychiatry's ongoing management of patients complicated delirium Needs to be free of restraints for 48 hours Plan to transition back to [facility] once medically optimized  Billing based on MDM: Low  The PMT will check in weekly though is support is needed sooner please call the team phone.   ______________________________________________________________________________________ Lamarr Lulas [facility] Palliative Medicine Team Team Cell Phone: [phone] Please utilize secure chat with additional questions, if there is no response within 30 minutes please call the above phone number  Palliative Medicine Team providers are available by phone from 7am to 7pm daily and can be reached through the team cell phone.  Should this patient require assistance outside of these hours, please call the patient's attending physician.

## 2023-08-08 NOTE — Consult Note (Signed)
[facility] Psychiatric Consult Follow-up  Patient Name: .Gregory [name]  MRN: [id number]  DOB: 1960-10-16  Consult Order details: [place]/severe agitation.   Mode of Visit: In person   Psychiatry Consult Evaluation  Service Date: August 08, 2023 LOS:  LOS: 23 days  Chief Complaint "I'm in pain everywhere"  Primary Psychiatric Diagnoses  Hyperactive delirium  2.  Somatic symptom disorder with predominant pain 3. History of MDD   Assessment  Gregory [name] is a 63 y.o. male admitted: Medicallyfor 07/15/2023  3:05 PM for acute agitated encephalopathy. 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   His current presentation of inattention, waxing and waning mental status, change over the last couple of days is most consistent with hyperactive delirium likely in the setting of prior acute on chronic R PCA infarct, multiple medications, uncontrolled pain. There is suspicion for somatic symptom disorder with predominant pain given chart review notable for multiple outpatient visits to chronic pain physicians and continued use of oxy throughout the years though notably nothing on PDMP. Main psychiatric diagnosis over the years has been depression/anxiety and patient and collateral denies any past psychiatric hospitalizations or suicide attempts. He reports depression but is unable to state what medications he has been on and whether or not they have helped. Current outpatient psychotropic medications from last admission included scheduled lexapro 20, oxy 15 TID, lyrica 25 TID, 100 BID, seroquel 100 at bedtime, PRN haldol 5 Q6H, oxy 5 Q6H and historically he was fully alert and mostly oriented on these medications. He was likely compliant with medications prior to admission since he was at the nursing  home. On initial examination, patient is alert, irritable, is not able to participate in sustained attention testing and is reporting pain. Will recommend decreasing seroquel, stopping klonopin, continuing depakote, making sure his pain is well controlled. I suspect that he has a low frustration tolerance with regards to his pain which contributes to agitation - was put on several anticholinergic or otherwise deliriogenic meds when in hyperactive delirium - finding right balance has been major challenge of this admission. Globally alertness and participation in PT has improved w/ reduction of medications with setback w/ recent UTI. Patient continued to wax and wane between alertness and somnolence. When alert, he would interfere with medical care requiring restraints. Given ineffectiveness of seroquel and haldol, he was started on zyprexa. Discussed with daughter. With increase in repeat ammonia and thrombocytopenia, depakote was ultimately tapered and discontinued. He received zyprexa but per nurse was still agitated and did not sleep. On 2/14, patient appeared somnolent after getting total zyprexa 20mg  overnight. He was also started on klonopin at bedtime to assist with agitation and sleep and appeared improved. He was briefly out of restraints but then required restraints in the evening after attempting to get out of bed again. Patient's mental status continues to wax and wane. Patient unable to consent to medication however discussed with daughter starting trileptal to help with mood stabilization. She was in agreement.   Patient continues to be disoriented and intermittently agitated, required PRN zyprexa x3 yesterday. He is oriented to self, year, somewhat to situation. We will increase his scheduled zyprexa in the AM since he is already getting increased amount of zyprexa during the day to attempt to treat his agitation and  harm to self/others. We will also obtain repeat labs to monitor Na with trileptal,  EKG to monitor him receiving increased zyprexa, repeat ammonia. Will also discontinue klonopin, melatonin to reduce polypharmacy. Primary team is also attempting to trial patient off restraints again today.   Diagnoses:  Active Hospital problems: Principal Problem:   Acute encephalopathy Active Problems:   Malnutrition of moderate degree   Persistent hyperactive delirium due to multiple etiologies   Agitation    Plan  ## Psychiatric Medication Recommendations:  --Continue Zyprexa 15 mg at bedtime for insomnia, mood stabilization, hallucinations --Start zyprexa 10mg  daily for agitation, mood stabilization, harm to self or others --continue lexapro 20 daily for depression  --Continue oxcarbazepine 150mg  BID for mood stabilization, irritability, agitation  Repeat CMP today  --optimize pain regimen  -- Start decreased Zyprexa 5mg  PO daily PRN for severe agitation, would not give PRN ativan within 2 hours of administration due to risk of respiratory depression  --stop depakote 500 ER at bedtime for mood stabilization-due to hyperammonemia and thrombocytopenia --stop seroquel 100 QHS for mood stabilization and insomnia given ineffectiveness, concern for polypharmacy --stop melatonin 3mg  at bedtime for insomnia to reduce polypharmacy --stop klonopin 1 mg at bedtime to decrease deliriogenic agents   ## Medical Decision Making Capacity: Not specifically addressed in this encounter  ## Further Work-up:  - VPA level 41 (albumin low 2.5 most recently) - will hold on further increases given clinical effect d/t low albumin.  --repeat ammonia level 2/13 46, will repeat ammonia again -- most recent EKG on 2/13 had Qtc of 422 -- Pertinent labwork reviewed earlier this admission includes: CMP (Cr 1.52), CBC, TSH, UDS  ## Disposition:-- There are no psychiatric contraindications to discharge at this time  ## Behavioral / Environmental: -Delirium Precautions: Delirium Interventions for Nursing and  Staff: - RN to open blinds every AM. - To Bedside: Glasses, hearing aide, and pt's own shoes. Make available to patients. when possible and encourage use. - Encourage po fluids when appropriate, keep fluids within reach. - OOB to chair with meals. - Passive ROM exercises to all extremities with AM & PM care. - RN to assess orientation to person, time and place QAM and PRN. - Recommend extended visitation hours with familiar family/friends as feasible. - Staff to minimize disturbances at night. Turn off television when pt asleep or when not in use.   ## Safety and Observation Level:  - Based on my clinical evaluation, I estimate the patient to be at moderate risk of self harm in the current setting due to agitation. - At this time, we recommend safety observation. This decision is based on my review of the chart including patient's history and current presentation, interview of the patient, mental status examination, and consideration of suicide risk including evaluating suicidal ideation, plan, intent, suicidal or self-harm behaviors, risk factors, and protective factors. This judgment is based on our ability to directly address suicide risk, implement suicide prevention strategies, and develop a safety plan while the patient is in the clinical setting. Please contact our team if there is a concern that risk level has changed.  CSSR Risk Category:C-SSRS RISK CATEGORY: No Risk  Suicide Risk Assessment: Patient has following modifiable risk factors for suicide: recklessness, which we are addressing by optimizing his medications Patient has following non-modifiable or demographic risk factors for suicide: male gender Patient has the following protective factors against suicide: Supportive family, no history of suicide attempts, and no history of NSSIB  Thank you for this consult  request. Recommendations have been communicated to the primary team.  We will continue to follow at this time.   Karie Fetch, MD, PGY-2                                                               History of Present Illness  Relevant Aspects of [facility] Hospital Course:  Admitted on 07/15/2023 for acute agitated encephalopathy.  -Required precedex  -Acute encephalopathy thought to be sepsis related ?AKI vs CKD  -Received 7 day course of CTX for possible UTI  ON events:  VSS. Per OT, patient yelling out for pickles on arrival, had decreased attention and required cues to recall how to dial out on hospital phone.   Patient Report:  Patient is seen laying in his bed. He reports he is feeling "mellow." He is oriented to self, year, place. He is not oriented to month or able to sustain attention. He is somewhat oriented to situation, states that he came into the hospital due to being confused. He is able to name [name] as his family member. He denies SI/HI/AVH.   Psych ROS:  Depression: reports always had depression Anxiety: reports anxiety  Mania (lifetime and current): denies  Psychosis: (lifetime and current): none  Collateral information:  Contacted daughter [name] 2/18 She reports patient has had increased agitation, reports he had increased agitation even towards her yesterday. She reports he had increased confusion, was talking about her uncle that died before she was born. Was asking for pain meds even though he just received pain meds. Discussed starting trileptal for mood stabilization, she reports she is aware of the medication and is open to starting this.   Called daughter [name] 2/12:  She reports patient is still not back to baseline, still appears confused. Reports he also mentioned seeing her son at the bedside but he is not there. He asks her if he's going crazy. Discussed changing seroquel to zyprexa to see if that will help with sleep. She reports he slept later even prior to all his medical issues. She also asks about thrombocytopenia and depakote.   Contacted daughter [name] at [phone]  on 2/5 She reports when he was discharged from last hospitalization, he was confused but pleasant and calm. Was with him when he was admitted to hospital. States he is not himself. States his agitation beforehand is if they were taking a long time to get his medication. He was more confused and didn't understand situation. Called her this morning and was with him last night, called her at 9pm. He was asking if she was coming to pick him up. He seemed confused about where he was last night. He seems a little more confused. Prior to admission, he was falling on the floor, trying to pee everywhere. Was not realizing where he was. On Monday nursing home told her he kept on falling in the floor. He didn't call her as he usually does. Still occasionally staring off when she was with him in the hospital. Trying to feed himself but weak. Held his pain medication. She reports she felt like he was a zombie yesterday. Sometimes need xanax when anxious. Yesterday he was telling her to clean up ice cream on floor. She thought perhaps he was bipolar. He can flip a switch  really quickly, gets agitated easily. If he doesn't get what he wants he gets really mad, has always been like that. He's been a functional adult until his health declined. Struggles with anxiety and depression from his health decline. Reports only psychotropic she is aware that he used to take was lexapro before all of his medical conditions.   ROS  Reports pain in his leg  Psychiatric and Social History  Psychiatric History:  Information collected from patient, chart review  Prev Dx/Sx: MDD Current Psych Provider: none Home Meds (current): scheduled lexapro 20, oxy 15 TID, lyrica 25 TID, 100 BID, seroquel 100 at bedtime, PRN haldol 5 Q6H, oxy 5 Q6H Previous Med Trials: wellbutrin, cymbalta, gabapentin Therapy: unknown  Prior Psych Hospitalization: denies  Prior Self Harm: denies Prior Violence: denies  Family Psych History: denies, reports  sister struggles with substance use  Family Hx suicide: unknown  Social History:  Living Situation: Lives at [facility] for several years, no access to anything else  Access to weapons/lethal means: none   Substance History Denies substance use history, UDS+opiates, BZD. Patient required ativan, versed.  -In the past he used marijuana frequently, but then became dependent on pain meds   Exam Findings  Vital Signs:  Temp:  [97.4 F (36.3 C)-98.2 F (36.8 C)] 98.2 F (36.8 C) (02/19 2109) Pulse Rate:  [57-66] 65 (02/19 2109) Resp:  [17-19] 19 (02/19 2109) BP: (109-146)/(69-75) 146/69 (02/19 2125) SpO2:  [98 %-100 %] 100 % (02/19 2109) Blood pressure (!) 146/69, pulse 65, temperature 98.2 F (36.8 C), resp. rate 19, height 6' (1.829 m), weight 80.1 kg, SpO2 100%. Body mass index is 23.95 kg/m.  Physical Exam Constitutional:      Appearance: He is obese.  HENT:     Head: Normocephalic.  Pulmonary:     Effort: Pulmonary effort is normal.  Neurological:     Mental Status: He is lethargic.    Mental Status Exam:  Orientation:  oriented to self, year, reason in hospital  Memory:   poor  Concentration:  Concentration: Poor  Recall:  Poor  Attention  Poor  Eye Contact:  Poor  Speech:  clear  Language:  fair  Volume:  normal  Mood: "mellow"  Affect:   labile  Thought Process:  poverty of thought  Thought Content:   reports in pain, denies SI/HI/AVH  Suicidal Thoughts:  denies  Homicidal Thoughts:  denies  Judgement:  poor  Insight:  limited  Psychomotor Activity:  Increased  Akathisia:  No  Fund of Knowledge:    Denied perceptual disturbances - likely intermittent VH Assets: some ability to communicate needs  Cognition:  Impaired  ADL's:  Impaired  AIMS (if indicated):        Other History   These have been pulled in through the EMR, reviewed, and updated if appropriate.  Family History:  The patient's family history includes Bone cancer in his father; Cancer  in his father, mother, paternal grandfather, and another family member; Diabetes in his mother and another family member; Heart disease in his mother; Prostate cancer in his father.  Medical History: 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)    Acquired absence of right leg below knee (HCC)    Acute osteomyelitis of calcaneum, right (HCC) 10/02/2016   Acute respiratory failure (HCC)    Amputation stump infection (HCC) 08/12/2018   Anemia    Basal cell carcinoma, eyelid    Cancer (HCC)  Candidiasis 08/12/2018   CHF (congestive heart failure) (HCC)    chronic diastolic    Chronic back pain    Chronic kidney disease    stage 3    Chronic multifocal osteomyelitis (HCC)    of ankle and foot    Chronic pain syndrome    COPD (chronic obstructive pulmonary disease) (HCC)    DDD (degenerative disc disease), lumbar    Depression    major depressive disorder    Diabetes mellitus without complication (HCC)    type 2    Diabetic ulcer of toe of left foot (HCC) 10/02/2016   Difficult intubation    Difficulty in walking, not elsewhere classified    Epidural abscess 10/02/2016   Foot drop, bilateral    Foot drop, right 12/27/2015   GERD (gastroesophageal reflux disease)    Herpesviral vesicular dermatitis    History of COVID-19 03/15/2022   Hyperlipidemia    Hyperlipidemia    Hypertension    Insomnia    Malignant melanoma of other parts of face (HCC)    Melanoma (HCC)    MRSA bacteremia    Muscle weakness (generalized)    Nasal congestion    Necrosis of toe (HCC) 07/08/2018   Neuromuscular dysfunction of bladder    Osteomyelitis (HCC)    Osteomyelitis (HCC)    Osteomyelitis (HCC)    right BKA   Other idiopathic peripheral autonomic neuropathy    Retention of urine, unspecified    Squamous cell carcinoma of skin    Stroke (HCC)    Unsteadiness on feet    Urinary retention    Urinary retention    Urinary tract infection    Wears  dentures    Wears glasses     Surgical History: Past Surgical History:  Procedure Laterality Date   AMPUTATION Left 03/29/2017   Procedure: LEFT GREAT TOE AMPUTATION AT METATARSOPHALANGEAL JOINT;  Surgeon: Nadara Mustard, MD;  Location: [facility] OR;  Service: Orthopedics;  Laterality: Left;   AMPUTATION Right 03/29/2017   Procedure: RIGHT BELOW KNEE AMPUTATION;  Surgeon: Nadara Mustard, MD;  Location: [facility] OR;  Service: Orthopedics;  Laterality: Right;   AMPUTATION Left 06/06/2018   Procedure: LEFT 2ND TOE AMPUTATION;  Surgeon: Nadara Mustard, MD;  Location: [facility] OR;  Service: Orthopedics;  Laterality: Left;   AMPUTATION Right 11/19/2018   Procedure: AMPUTATION ABOVE KNEE;  Surgeon: Nadara Mustard, MD;  Location: [facility] OR;  Service: Orthopedics;  Laterality: Right;   ANTERIOR CERVICAL CORPECTOMY N/A 11/25/2015   Procedure: ANTERIOR CERVICAL FIVE CORPECTOMY Cervical four - six fusion;  Surgeon: Lisbeth Renshaw, MD;  Location: [facility];  Service: Neurosurgery;  Laterality: N/A;  ANTERIOR CERVICAL FIVE CORPECTOMY Cervical four - six fusion   APPLICATION OF WOUND VAC Right 10/22/2018   Procedure: Application Of Wound Vac;  Surgeon: Nadara Mustard, MD;  Location[facility] OR;  Service: Orthopedics;  Laterality: Right;   CYSTOSCOPY WITH BIOPSY N/A 05/01/2022   Procedure: CYSTOSCOPY WITH URETHRAL BIOPSY;  Surgeon: Noel Christmas, MD;  Location: [facility] ORS;  Service: Urology;  Laterality: N/A;  1 HR   CYSTOSCOPY WITH RETROGRADE URETHROGRAM N/A 04/25/2018   Procedure: CYSTOSCOPY WITH RETROGRADE URETHROGRAM/ BALLOON DILATION;  Surgeon: Ihor Gully, MD;  Location: [facility] ORS;  Service: Urology;  Laterality: N/A;   CYSTOSCOPY WITH URETHRAL DILATATION N/A 05/01/2022   Procedure: BALLOON DILATION WITH  OPTILUME;  Surgeon: Noel Christmas, MD;  Location: [facility] ORS;  Service: Urology;  Laterality: N/A;   LAPAROTOMY N/A 12/03/2022  Procedure: EXPLORATORY LAPAROTOMY  total abdominal colectomy with end ileostomy;  Surgeon:  Kinsinger, De Blanch, MD;  Location: [facility] OR;  Service: General;  Laterality: N/A;   MULTIPLE TOOTH EXTRACTIONS     POSTERIOR CERVICAL FUSION/FORAMINOTOMY N/A 11/29/2015   Procedure: Cervical Three-Cervical Seven Posterior Cervical Laminectomy with Fusion;  Surgeon: Lisbeth Renshaw, MD;  Location: [facility];  Service: Neurosurgery;  Laterality: N/A;  Cervical Three-Cervical Seven Posterior Cervical Laminectomy with Fusion   STUMP REVISION Right 10/22/2018   Procedure: REVISION RIGHT BELOW KNEE AMPUTATION;  Surgeon: Nadara Mustard, MD;  Location: [facility] OR;  Service: Orthopedics;  Laterality: Right;   STUMP REVISION Right 12/05/2018   Procedure: Revision Right Above Knee Amputation;  Surgeon: Nadara Mustard, MD;  Location: [facility] OR;  Service: Orthopedics;  Laterality: Right;   STUMP REVISION Right 05/29/2019   Procedure: REVISION RIGHT ABOVE KNEE AMPUTATION;  Surgeon: Nadara Mustard, MD;  Location: [facility] OR;  Service: Orthopedics;  Laterality: Right;   STUMP REVISION Right 06/24/2019   Procedure: STUMP REVISION;  Surgeon: Nadara Mustard, MD;  Location: [facility] OR;  Service: Orthopedics;  Laterality: Right;     Medications:   Current Facility-Administered Medications:    acetaminophen (TYLENOL) tablet 650 mg, 650 mg, Oral, Q6H, Lorin Glass, MD, 650 mg at 08/08/23 1610   alum & mag hydroxide-simeth (MAALOX/MYLANTA) 200-200-20 MG/5ML suspension 30 mL, 30 mL, Oral, Q4H PRN, Noralee Stain, DO, 30 mL at 08/07/23 1544   arformoterol (BROVANA) nebulizer solution 15 mcg, 15 mcg, Nebulization, BID, Agarwala, Ravi, MD, 15 mcg at 08/05/23 2244   aspirin EC tablet 81 mg, 81 mg, Oral, Daily, Hunsucker, Lesia Sago, MD, 81 mg at 08/07/23 0805   atorvastatin (LIPITOR) tablet 80 mg, 80 mg, Oral, QHS, Autry, Lauren E, [name], 80 mg at 08/07/23 2125   budesonide (PULMICORT) nebulizer solution 0.5 mg, 0.5 mg, Nebulization, BID, Lidia Collum, [name], 0.5 mg at 08/05/23 2244   carvedilol (COREG) tablet 12.5 mg, 12.5 mg, Oral, BID  WC, Autry, Lauren E, [name], 12.5 mg at 08/07/23 1707   Chlorhexidine Gluconate Cloth 2 % PADS 6 each, 6 each, Topical, Daily, Lorin Glass, MD, 6 each at 08/07/23 1146   clonazePAM (KLONOPIN) tablet 1 mg, 1 mg, Oral, QHS, Akintayo, Musa A, MD, 1 mg at 08/07/23 2126   escitalopram (LEXAPRO) tablet 20 mg, 20 mg, Oral, Daily, Autry, Lauren E, [name], 20 mg at 08/07/23 0806   famotidine (PEPCID) tablet 20 mg, 20 mg, Oral, Daily, Lidia Collum, [name], 20 mg at 08/07/23 9604   feeding supplement (ENSURE ENLIVE / ENSURE PLUS) liquid 237 mL, 237 mL, Oral, BID BM, Hunsucker, Lesia Sago, MD, 237 mL at 08/05/23 1530   heparin injection 5,000 Units, 5,000 Units, Subcutaneous, Q8H, Lidia Collum, [name], 5,000 Units at 08/08/23 5409   hydrALAZINE (APRESOLINE) injection 10-40 mg, 10-40 mg, Intravenous, Q4H PRN, Pia Mau D, [name]   hydrALAZINE (APRESOLINE) tablet 10 mg, 10 mg, Oral, Q8H, Autry, Lauren E, [name], 10 mg at 08/07/23 2125   HYDROmorphone (DILAUDID) tablet 3 mg, 3 mg, Oral, Q6H, Marolyn Haller, MD, 3 mg at 08/08/23 8119   ipratropium-albuterol (DUONEB) 0.5-2.5 (3) MG/3ML nebulizer solution 3 mL, 3 mL, Nebulization, Q4H PRN, Pia Mau D, [name], 3 mL at 08/05/23 1478   melatonin tablet 3 mg, 3 mg, Oral, QHS, Karie Fetch, MD, 3 mg at 08/07/23 2125   multivitamin with minerals tablet 1 tablet, 1 tablet, Oral, Daily, Deanna Artis, DO, 1 tablet at 08/07/23 [phone]  OLANZapine zydis (ZYPREXA) disintegrating tablet 5 mg, 5 mg, Oral, Q6H PRN, 5 mg at 08/07/23 1707 **OR** OLANZapine (ZYPREXA) injection 5 mg, 5 mg, Intramuscular, Q6H PRN, Marolyn Haller, MD, 5 mg at 08/05/23 1839   OLANZapine zydis (ZYPREXA) disintegrating tablet 15 mg, 15 mg, Oral, QHS, Karie Fetch, MD, 15 mg at 08/07/23 2125   ondansetron (ZOFRAN-ODT) disintegrating tablet 4 mg, 4 mg, Oral, Q8H PRN, Marcelino Duster, MD, 4 mg at 08/05/23 1706   Oral care mouth rinse, 15 mL, Mouth Rinse, PRN, Kalman Shan, MD    OXcarbazepine (TRILEPTAL) tablet 150 mg, 150 mg, Oral, BID, Karie Fetch, MD, 150 mg at 08/07/23 2126   oxymetazoline (AFRIN) 0.05 % nasal spray 1 spray, 1 spray, Each Nare, BID, Marcelino Duster, MD, 1 spray at 08/07/23 2126   pantoprazole (PROTONIX) EC tablet 40 mg, 40 mg, Oral, Daily, Autry, Lauren E, [name], 40 mg at 08/07/23 0806   polyethylene glycol (MIRALAX / GLYCOLAX) packet 17 g, 17 g, Oral, Daily PRN, Suzie Portela, John D, [name]   pregabalin (LYRICA) capsule 50 mg, 50 mg, Oral, BID, Marolyn Haller, MD, 50 mg at 08/07/23 2124   revefenacin (YUPELRI) nebulizer solution 175 mcg, 175 mcg, Nebulization, Daily, Lidia Collum, [name], 175 mcg at 08/04/23 1610   senna (SENOKOT) tablet 8.6 mg, 1 tablet, Oral, Daily, Marolyn Haller, MD, 8.6 mg at 08/07/23 0805   tamsulosin (FLOMAX) capsule 0.4 mg, 0.4 mg, Oral, Daily, Sharlene Dory, Derek W, DO, 0.4 mg at 08/07/23 0806   [COMPLETED] thiamine (VITAMIN B1) 500 mg in sodium chloride 0.9 % 50 mL IVPB, 500 mg, Intravenous, Q8H, Last Rate: 110 mL/hr at 07/21/23 0600, Infusion Verify at 07/21/23 0600 **FOLLOWED BY** thiamine (VITAMIN B1) tablet 100 mg, 100 mg, Oral, Daily, Lorin Glass, MD, 100 mg at 08/07/23 9604  Allergies: Allergies  Allergen Reactions   Latex Other (See Comments)    Unknown reaction    Karie Fetch, MD, PGY-2

## 2023-08-08 NOTE — Progress Notes (Signed)
PROGRESS NOTE    [name]  BJY:[id number] DOB: January 21, 1961 DOA: 07/15/2023 PCP: [name], Provider, MD     Brief Narrative:  Gregory [name] Gregory [name] is a 63 year old male with history of diabetes mellitus type 2, hypertension, hyperlipidemia, chronic kidney disease stage IIIb, COPD, chronic osteomyelitis status post right AKA, left midfoot amputation, unspecified stroke, A-fib, chronic diastolic heart failure, mesenteric ischemia status post colostomy and recent hospitalization from 06/27/2023 to 07/11/2023 for septic shock due to ESBL UTI and multifocal pneumonia with rhinovirus infection/ acute respiratory failure with hypoxia requiring intubation and extubation/ acute encephalopathy/ acute ischemic stroke and subsequent discharge to SNF presented with severe agitation and delirium.    He was admitted to ICU and managed with Precedex.  He was transferred to [facility] service from 07/23/2023 onwards. Palliative care and psychiatry following. Currently pending SNF placement once mental status better.   He is intermittently agitates, delirious requiring restraints, haldol. Psych team following him and making medication changes.  New events last 24 hours / Subjective: Patient is sitting at the side of bed, eating breakfast.  PT is in room.  Patient was taken off of restraints around 9 AM this morning.  He is calm and cooperative, answers questions appropriately.  Has no complaints.  Assessment & Plan:   Principal Problem:   Acute encephalopathy Active Problems:   Malnutrition of moderate degree   Persistent hyperactive delirium due to multiple etiologies   Agitation   Persistent encephalopathy Hyperactive delirium At baseline when discharged 07/11/2023: Patient was lucid, able to feed, and bathe himself. [name]. His daughter makes decisions for his care. Initially admitted to ICU, required Precedex. Ammonia, VBG with no abnormalities to explain patient's presentation. CT of the head was  negative for acute intracranial abnormality. Currently no obvious medical reasons to explain delirium.    Discussed with psychiatric team.  Adjusting medications.  Trial off restraints today  Possible Urinary Tract Infection Finished 5 days of ceftriaxone.    COPD not in acute exacerbation  Stable. Continue current nebulized regimen   HFpEF  Hypertension Hyperlipidemia Hypotension Resolved  BP remains stable Continue Coreg, hydralazine and statin.     Diabetes mellitus type 2 Hypoglycemia resolved Well controlled. Has had some low blood sugars in the setting of poor oral intake.  CBG checks per hypoglycemia protocol.   Acute kidney injury CKD stage IIIb Kidney function at baseline. Avoid nephrotoxic medicaitons.    Chronic pain syndrome Continue current regimen. On scheduled APAP and IV dilaudid.    Depression/anxiety Continue current regimen per psychiatry     Anemia of chronic disease Possibly from chronic kidney disease. No iron deficiency. Vit B12 WNL.  Hgb stable    Thrombocytopenia  Stable. Possibly in the setting of medications. Depakote reduced per psych.    Goals of care Palliative care and medical team had discussion with patient's daughter. She would like patient to remain full code at this time as this was his wishes prior to his confusion.   Daughter wishes to get a Engineer, site for patient once he goes back to [facility].    Generalized deconditioning PT recommending SNF placement.  Back to [name] if mental status better. TOC aware.    DVT prophylaxis:  heparin injection 5,000 Units Start: 07/16/23 0600  Code Status: Full Family Communication: None at bedside Disposition Plan: SNF Status is: Inpatient Remains inpatient appropriate because: delirium.  He needs to be out of his mittens and telemetry sitter/sitter discontinued for 48 hours prior  to discharge.    Antimicrobials:  Anti-infectives (From admission, onward)    Start      Dose/Rate Route Frequency Ordered Stop   07/26/23 1530  sulfamethoxazole-trimethoprim (BACTRIM DS) 800-160 MG per tablet 1 tablet  Status:  Discontinued        1 tablet Oral Every 12 hours 07/26/23 1439 07/26/23 1439   07/26/23 1500  cefTRIAXone (ROCEPHIN) 1 g in sodium chloride 0.9 % 100 mL IVPB        1 g 200 mL/hr over 30 Minutes Intravenous Every 24 hours 07/26/23 1440 07/31/23 1459   07/16/23 2200  vancomycin (VANCOREADY) IVPB 750 mg/150 mL  Status:  Discontinued        750 mg 150 mL/hr over 60 Minutes Intravenous Every 12 hours 07/16/23 0739 07/16/23 1243   07/16/23 2200  vancomycin (VANCOCIN) 750 mg in sodium chloride 0.9 % 250 mL IVPB  Status:  Discontinued       Note to Pharmacy: Indication: Sepsis   750 mg 265 mL/hr over 60 Minutes Intravenous Every 12 hours 07/16/23 1243 07/17/23 0821   07/16/23 0800  vancomycin (VANCOREADY) IVPB 1750 mg/350 mL        1,750 mg 175 mL/hr over 120 Minutes Intravenous  Once 07/16/23 0705 07/16/23 1137   07/16/23 0800  piperacillin-tazobactam (ZOSYN) IVPB 3.375 g  Status:  Discontinued        3.375 g 12.5 mL/hr over 240 Minutes Intravenous Every 8 hours 07/16/23 0705 07/17/23 0821        Objective: Vitals:   08/07/23 2109 08/07/23 2125 08/08/23 0809 08/08/23 1000  BP: (!) 146/69 (!) 146/69 107/66   Pulse: 65  (!) 51 (!) 58  Resp: 19  17   Temp: 98.2 F (36.8 C)  98.5 F (36.9 C)   TempSrc:   Oral   SpO2: 100%  98%   Weight:      Height:        Intake/Output Summary (Last 24 hours) at 08/08/2023 1047 Last data filed at 08/08/2023 1032 Gross per 24 hour  Intake 474 ml  Output 1100 ml  Net -626 ml   Filed Weights   08/05/23 0520 08/06/23 0500 08/07/23 0423  Weight: 80.6 kg 78.6 kg 80.1 kg   Examination: General exam: Appears calm and comfortable  Psychiatry:Mood & affect appropriate.     Data Reviewed: I have personally reviewed following labs and imaging studies  CBC: Recent Labs  Lab 08/03/23 0718  WBC 6.4  HGB 10.3*   HCT 32.2*  MCV 91.0  PLT 138*   Basic Metabolic Panel: Recent Labs  Lab 08/03/23 0718  NA 134*  K 4.5  CL 102  CO2 22  GLUCOSE 106*  BUN 19  CREATININE 1.61*  CALCIUM 9.3  MG 1.5*  PHOS 4.2   GFR: Estimated Creatinine Clearance: 50.9 mL/min (A) (by C-G formula based on SCr of 1.61 mg/dL (H)). Liver Function Tests: Recent Labs  Lab 08/03/23 0718  AST 19  ALT 13  ALKPHOS 71  BILITOT 0.5  PROT 6.7  ALBUMIN 2.9*   No results for input(s): "LIPASE", "AMYLASE" in the last 168 hours. Recent Labs  Lab 08/01/23 1244  AMMONIA 46*   Coagulation Profile: No results for input(s): "INR", "PROTIME" in the last 168 hours. Cardiac Enzymes: No results for input(s): "CKTOTAL", "CKMB", "CKMBINDEX", "TROPONINI" in the last 168 hours. BNP (last 3 results) No results for input(s): "PROBNP" in the last 8760 hours. HbA1C: No results for input(s): "HGBA1C" in the last 72 hours.  CBG: Recent Labs  Lab 08/07/23 0633 08/07/23 0833 08/07/23 1149 08/07/23 1553 08/08/23 0807  GLUCAP 143* 113* 209* 107* 112*   Lipid Profile: No results for input(s): "CHOL", "HDL", "LDLCALC", "TRIG", "CHOLHDL", "LDLDIRECT" in the last 72 hours. Thyroid Function Tests: No results for input(s): "TSH", "T4TOTAL", "FREET4", "T3FREE", "THYROIDAB" in the last 72 hours. Anemia Panel: No results for input(s): "VITAMINB12", "FOLATE", "FERRITIN", "TIBC", "IRON", "RETICCTPCT" in the last 72 hours. Sepsis Labs: No results for input(s): "PROCALCITON", "LATICACIDVEN" in the last 168 hours.  No results found for this or any previous visit (from the past 240 hours).    Radiology Studies: No results found.    Scheduled Meds:  acetaminophen  650 mg Oral Q6H   arformoterol  15 mcg Nebulization BID   aspirin EC  81 mg Oral Daily   atorvastatin  80 mg Oral QHS   budesonide (PULMICORT) nebulizer solution  0.5 mg Nebulization BID   carvedilol  12.5 mg Oral BID WC   Chlorhexidine Gluconate Cloth  6 each  Topical Daily   escitalopram  20 mg Oral Daily   famotidine  20 mg Oral Daily   feeding supplement  237 mL Oral BID BM   heparin  5,000 Units Subcutaneous Q8H   hydrALAZINE  10 mg Oral Q8H   HYDROmorphone  3 mg Oral Q6H   magnesium oxide  400 mg Oral Daily   melatonin  3 mg Oral QHS   multivitamin with minerals  1 tablet Oral Daily   OLANZapine  10 mg Oral Daily   OLANZapine zydis  15 mg Oral QHS   OXcarbazepine  150 mg Oral BID   oxymetazoline  1 spray Each Nare BID   pantoprazole  40 mg Oral Daily   pregabalin  50 mg Oral BID   revefenacin  175 mcg Nebulization Daily   senna  1 tablet Oral Daily   tamsulosin  0.4 mg Oral Daily   thiamine  100 mg Oral Daily   Continuous Infusions:   Gregory: 23 days   Time spent: 25 minutes   Noralee Stain, DO [facility] 08/08/2023, 10:47 AM   Available via Epic secure chat 7am-7pm After these hours, please refer to coverage provider listed on [email or web address]

## 2023-08-08 NOTE — Progress Notes (Signed)
Physical Therapy Treatment Patient Details Name: Gregory [name] MRN: [id number] DOB: May 20, 1961 Today's Date: 08/08/2023   History of Present Illness 63 yo male from [facility] admitted 1/27 with AMS; Acute encephalopathy/agitation delirium. recently admitted on 1/9-1/23 with septic shock and AMS secondary to ESBL UTI and Serratia/Klebsiella multifocal pneumonia/positive rhinovirus.PMH DM2 HTN HLD CKDII COPD s/p R AKA L midfoot amputation CVA Afib mesenteric ischemia s/p colostomy 12/2022 presents to [facility] ED on 1/27 with agitation delirium.    PT Comments  Tolerated treatment well. Mod assist to rise to EOB, initially lethargic but became more alert with mobility. Sat EOB majority of visit, feeding himself with intermittent assist and cues to scan for items. Good midline trunk control. Lateral scoot training and min assist to return to supine. Able to scoot up in bed via modified bridge and min assist to reposition. Still a bit confused but easy to redirect and reorient. Able to carry an appropriate conversation regarding current weather and breakfast, past career. Left in chair position in bed, alarm active. Patient will benefit from continued inpatient follow up therapy, <3 hours/day. Patient will continue to benefit from skilled physical therapy services to further improve independence with functional mobility.     If plan is discharge home, recommend the following: Assistance with cooking/housework;Direct supervision/assist for medications management;Direct supervision/assist for financial management;Assist for transportation;Supervision due to cognitive status;A lot of help with walking and/or transfers;A lot of help with bathing/dressing/bathroom   Can travel by private vehicle     No  Equipment Recommendations  None recommended by PT    Recommendations for Other Services       Precautions / Restrictions Precautions Precautions: Fall;Other (comment) Recall of Precautions/Restrictions:  Impaired Precaution/Restrictions Comments: Colostomy on Rt Required Braces or Orthoses:  (no prosthesis here) Restrictions Weight Bearing Restrictions Per Provider Order: No Other Position/Activity Restrictions: No weight bearing restricitions per orders or prior PT notes     Mobility  Bed Mobility Overal bed mobility: Needs Assistance Bed Mobility: Supine to Sit, Sit to Supine   Sidelying to sit: Mod assist, HOB elevated Supine to sit: Min assist     General bed mobility comments: Mod assist to rise to EOB, initially lethargic but improved with movement and able to assist more. Returns with min assist for turnk support to realign in center of bed.    Transfers Overall transfer level: Needs assistance                Lateral/Scoot Transfers: Mod assist General transfer comment: Performed lateral scoot along bed, able to push though LLE, mod assist to slide along bed and sequence.    Ambulation/Gait                   Stairs             Wheelchair Mobility     Tilt Bed    Modified Rankin (Stroke Patients Only)       Balance Overall balance assessment: Needs assistance Sitting-balance support: No upper extremity supported, Feet supported Sitting balance-Leahy Scale: Fair Sitting balance - Comments: supervision EOB                                    Communication Communication Communication: No apparent difficulties  Cognition Arousal: Alert Behavior During Therapy: WFL for tasks assessed/performed   PT - Cognitive impairments: No family/caregiver present to determine baseline, Awareness, Initiation, Sequencing, Problem solving  PT - Cognition Comments: Still somewhat confused, with reduced awareness and required multimodal cues to facilitate to EOB. Pt asked to turn gas fireplace on,  believing he was at home but was able to reorient. Following commands: Impaired Following commands impaired:  Follows multi-step commands inconsistently, Follows one step commands with increased time    Cueing Cueing Techniques: Verbal cues, Gestural cues, Tactile cues  Exercises      General Comments General comments (skin integrity, edema, etc.): Tolerated majority of session sitting EOB working on Dentist. Able to feed himself with intermittent assist and set-up. Cues for scanning, seems to have a little neglect towards the left?      Pertinent Vitals/Pain Pain Assessment Pain Assessment: No/denies pain    Home Living                          Prior Function            PT Goals (current goals can now be found in the care plan section) Acute Rehab PT Goals Patient Stated Goal: Go back to SNF PT Goal Formulation: With patient Time For Goal Achievement: 08/19/23 Potential to Achieve Goals: Fair Progress towards PT goals: Progressing toward goals    Frequency    Min 1X/week      PT Plan      Co-evaluation              AM-PAC PT "6 Clicks" Mobility   Outcome Measure  Help needed turning from your back to your side while in a flat bed without using bedrails?: A Little Help needed moving from lying on your back to sitting on the side of a flat bed without using bedrails?: A Lot Help needed moving to and from a bed to a chair (including a wheelchair)?: Total Help needed standing up from a chair using your arms (e.g., wheelchair or bedside chair)?: Total Help needed to walk in hospital room?: Total Help needed climbing 3-5 steps with a railing? : Total 6 Click Score: 9    End of Session   Activity Tolerance: Patient tolerated treatment well Patient left: in bed;with call bell/phone within reach;with bed alarm set Nurse Communication: Mobility status PT Visit Diagnosis: Muscle weakness (generalized) (M62.81);Difficulty in walking, not elsewhere classified (R26.2);Other symptoms and signs involving the nervous system (R29.898)     Time:  8119-1478 PT Time Calculation (min) (ACUTE ONLY): 34 min  Charges:    $Therapeutic Activity: 8-22 mins $Self Care/Home Management: 8-22 PT General Charges $$ ACUTE PT VISIT: 1 Visit                     Kathlyn Sacramento, PT, DPT [facility]  Rehabilitation Services Physical Therapist Office: [phone] Website: [email or web address]    Berton Mount 08/08/2023, 11:00 AM

## 2023-08-08 NOTE — Plan of Care (Addendum)
Patient is drowsy throughout the shift but easily rousable ; calm and cooperative, refused dinner tray ; bed locked in lowest position ; free of falls ; call bell within reach ; will continue to monitor  Problem: Safety: Goal: Non-violent Restraint(s) Outcome: Progressing   Problem: Education: Goal: Knowledge of General Education information will improve Description: Including pain rating scale, medication(s)/side effects and non-pharmacologic comfort measures Outcome: Progressing   Problem: Health Behavior/Discharge Planning: Goal: Ability to manage health-related needs will improve Outcome: Progressing   Problem: Clinical Measurements: Goal: Ability to maintain clinical measurements within normal limits will improve Outcome: Progressing Goal: Will remain free from infection Outcome: Progressing Goal: Diagnostic test results will improve Outcome: Progressing Goal: Respiratory complications will improve Outcome: Progressing Goal: Cardiovascular complication will be avoided Outcome: Progressing   Problem: Activity: Goal: Risk for activity intolerance will decrease Outcome: Progressing   Problem: Nutrition: Goal: Adequate nutrition will be maintained Outcome: Progressing   Problem: Coping: Goal: Level of anxiety will decrease Outcome: Progressing   Problem: Elimination: Goal: Will not experience complications related to bowel motility Outcome: Progressing Goal: Will not experience complications related to urinary retention Outcome: Progressing   Problem: Pain Managment: Goal: General experience of comfort will improve and/or be controlled Outcome: Progressing   Problem: Safety: Goal: Ability to remain free from injury will improve Outcome: Progressing   Problem: Skin Integrity: Goal: Risk for impaired skin integrity will decrease Outcome: Progressing

## 2023-08-08 NOTE — TOC Progression Note (Signed)
Transition of Care [facility]) - Progression Note    Patient Details  Name: Gregory [name] MRN: [id number] Date of Birth: November 21, 1960  [facility] of [facility]) CM/SW Contact  Janae Bridgeman, RN Phone Number: 08/08/2023, 10:11 AM  Clinical Narrative:    MD/ bedside nursing is aware that patient will need to be without restraints/sitter for 48 hours so that he can safely return to the nursing home.   Staff will attempt to remove restraints today and will provide prn medication to avoid restraints if possible.  CM and MSW with [facility] Team will continue to follow the patient for TOC needs.        Expected Discharge Plan and Services                                               Social Determinants of Health (SDOH) Interventions SDOH Screenings   Food Insecurity: No Food Insecurity (07/20/2023)  Housing: Low Risk  (07/20/2023)  Transportation Needs: No Transportation Needs (07/20/2023)  Utilities: Not At Risk (07/20/2023)  Depression (PHQ2-9): Low Risk  (03/08/2021)  Tobacco Use: High Risk (07/29/2023)    Readmission Risk Interventions    09/06/2022    1:48 PM 08/27/2022   12:46 PM 05/03/2022   11:53 AM  Readmission Risk Prevention Plan  Transportation Screening Complete Complete Complete  Medication Review Oceanographer) Complete Complete Complete  PCP or Specialist appointment within 3-5 days of discharge Complete Complete Complete  HRI or Home Care Consult Complete Complete Complete  SW Recovery Care/Counseling Consult Complete Complete Complete  Palliative Care Screening Not Applicable Not Applicable Not Applicable  Skilled Nursing Facility Complete Complete Complete

## 2023-08-09 LAB — GLUCOSE, CAPILLARY
Glucose-Capillary: 110 mg/dL — ABNORMAL HIGH (ref 70–99)
Glucose-Capillary: 124 mg/dL — ABNORMAL HIGH (ref 70–99)
Glucose-Capillary: 149 mg/dL — ABNORMAL HIGH (ref 70–99)
Glucose-Capillary: 160 mg/dL — ABNORMAL HIGH (ref 70–99)
Glucose-Capillary: 197 mg/dL — ABNORMAL HIGH (ref 70–99)

## 2023-08-09 LAB — COMPREHENSIVE METABOLIC PANEL
ALT: 14 U/L (ref 0–44)
AST: 14 U/L — ABNORMAL LOW (ref 15–41)
Albumin: 2.9 g/dL — ABNORMAL LOW (ref 3.5–5.0)
Alkaline Phosphatase: 70 U/L (ref 38–126)
Anion gap: 12 (ref 5–15)
BUN: 27 mg/dL — ABNORMAL HIGH (ref 8–23)
CO2: 19 mmol/L — ABNORMAL LOW (ref 22–32)
Calcium: 8.9 mg/dL (ref 8.9–10.3)
Chloride: 100 mmol/L (ref 98–111)
Creatinine, Ser: 1.66 mg/dL — ABNORMAL HIGH (ref 0.61–1.24)
GFR, Estimated: 46 mL/min — ABNORMAL LOW (ref >60)
Glucose, Bld: 114 mg/dL — ABNORMAL HIGH (ref 70–99)
Potassium: 4.8 mmol/L (ref 3.5–5.1)
Sodium: 131 mmol/L — ABNORMAL LOW (ref 135–145)
Total Bilirubin: 0.4 mg/dL (ref 0.0–1.2)
Total Protein: 6.3 g/dL — ABNORMAL LOW (ref 6.5–8.1)

## 2023-08-09 LAB — CBC
HCT: 29.3 % — ABNORMAL LOW (ref 39.0–52.0)
Hemoglobin: 9.8 g/dL — ABNORMAL LOW (ref 13.0–17.0)
MCH: 30.2 pg (ref 26.0–34.0)
MCHC: 33.4 g/dL (ref 30.0–36.0)
MCV: 90.4 fL (ref 80.0–100.0)
Platelets: 207 K/uL (ref 150–400)
RBC: 3.24 MIL/uL — ABNORMAL LOW (ref 4.22–5.81)
RDW: 16.2 % — ABNORMAL HIGH (ref 11.5–15.5)
WBC: 11.2 K/uL — ABNORMAL HIGH (ref 4.0–10.5)
nRBC: 0 % (ref 0.0–0.2)

## 2023-08-09 LAB — AMMONIA: Ammonia: 42 umol/L — ABNORMAL HIGH (ref 9–35)

## 2023-08-09 MED ORDER — LACTULOSE 10 GM/15ML PO SOLN
10.0000 g | Freq: Two times a day (BID) | ORAL | Status: DC
  Administered 2023-08-09 – 2023-08-11 (×5): 10 g via ORAL
  Filled 2023-08-09 (×5): qty 15

## 2023-08-09 MED ORDER — LORAZEPAM 2 MG/ML IJ SOLN
1.0000 mg | Freq: Once | INTRAMUSCULAR | Status: AC
  Administered 2023-08-09: 1 mg via INTRAMUSCULAR
  Filled 2023-08-09: qty 1

## 2023-08-09 MED ORDER — LORAZEPAM 2 MG/ML IJ SOLN
1.0000 mg | Freq: Once | INTRAMUSCULAR | Status: AC
  Administered 2023-08-09: 1 mg via INTRAMUSCULAR

## 2023-08-09 MED ORDER — LORAZEPAM 2 MG/ML IJ SOLN
1.0000 mg | Freq: Once | INTRAMUSCULAR | Status: DC
  Filled 2023-08-09: qty 1

## 2023-08-09 MED ORDER — CARVEDILOL 6.25 MG PO TABS
6.2500 mg | ORAL_TABLET | Freq: Two times a day (BID) | ORAL | Status: DC
  Administered 2023-08-09 – 2023-08-13 (×6): 6.25 mg via ORAL
  Filled 2023-08-09 (×9): qty 1

## 2023-08-09 NOTE — Progress Notes (Signed)
PROGRESS NOTE    Gregory [name]  ZOX:[id number] DOB: 1961/03/05 DOA: 07/15/2023 PCP: [name], Provider, MD     Brief Narrative:  Gregory [name] is a 63 year old male with history of diabetes mellitus type 2, hypertension, hyperlipidemia, chronic kidney disease stage IIIb, COPD, chronic osteomyelitis status post right AKA, left midfoot amputation, unspecified stroke, A-fib, chronic diastolic heart failure, mesenteric ischemia status post colostomy and recent hospitalization from 06/27/2023 to 07/11/2023 for septic shock due to ESBL UTI and multifocal pneumonia with rhinovirus infection/ acute respiratory failure with hypoxia requiring intubation and extubation/ acute encephalopathy/ acute ischemic stroke and subsequent discharge to SNF presented with severe agitation and delirium.    He was admitted to ICU and managed with Precedex.  He was transferred to [facility] service from 07/23/2023 onwards. Palliative care and psychiatry following. Currently pending SNF placement once mental status better.   He is intermittently agitates, delirious requiring restraints, haldol. Psych team following him and making medication changes.  New events last 24 hours / Subjective: Per report, patient was agitated overnight, did not sleep, did require as needed dose of Zyprexa around 430 this morning.  He has been off restraints since yesterday morning.  Now somnolent on my exam  Assessment & Plan:   Principal Problem:   Acute encephalopathy Active Problems:   Malnutrition of moderate degree   Persistent hyperactive delirium due to multiple etiologies   Agitation   Persistent encephalopathy Hyperactive delirium At baseline when discharged 07/11/2023: Patient was lucid, able to feed, and bathe himself. He lives at [facility]. His daughter makes decisions for his care. Initially admitted to ICU, required Precedex. Ammonia, VBG with no abnormalities to explain patient's presentation. CT of the head was negative for acute  intracranial abnormality. Currently no obvious medical reasons to explain delirium.    Ammonia is elevated despite discontinuing Depakote.  LFTs pending.  Added lactulose today  Possible Urinary Tract Infection Finished 5 days of ceftriaxone.    COPD not in acute exacerbation  Stable. Continue current nebulized regimen   HFpEF  Hypertension Hyperlipidemia Hypotension Resolved  BP remains stable Continue Coreg, hydralazine and statin.     Diabetes mellitus type 2 Hypoglycemia resolved Well controlled. Has had some low blood sugars in the setting of poor oral intake.  CBG checks per hypoglycemia protocol.   Acute kidney injury CKD stage IIIb Kidney function at baseline. Avoid nephrotoxic medicaitons.    Chronic pain syndrome Continue current regimen. On scheduled APAP and IV dilaudid.    Depression/anxiety Continue current regimen per psychiatry     Anemia of chronic disease Possibly from chronic kidney disease. No iron deficiency. Vit B12 WNL.  Hgb stable    Thrombocytopenia  Stable. Possibly in the setting of medications. Depakote discontinued   Goals of care Palliative care and medical team had discussion with patient's daughter. She would like patient to remain full code at this time as this was his wishes prior to his confusion.   Daughter wishes to get a Engineer, site for patient once he goes back to [facility].    Generalized deconditioning PT recommending SNF placement.  Back to [facility] if mental status better. TOC aware.    DVT prophylaxis:  heparin injection 5,000 Units Start: 07/16/23 0600  Code Status: Full Family Communication: None at bedside Disposition Plan: SNF Status is: Inpatient Remains inpatient appropriate because: delirium.  He needs to be out of his mittens and telemetry sitter/sitter discontinued for 48 hours prior to discharge.    Antimicrobials:  Anti-infectives (From admission, onward)    Start     Dose/Rate Route  Frequency Ordered Stop   07/26/23 1530  sulfamethoxazole-trimethoprim (BACTRIM DS) 800-160 MG per tablet 1 tablet  Status:  Discontinued        1 tablet Oral Every 12 hours 07/26/23 1439 07/26/23 1439   07/26/23 1500  cefTRIAXone (ROCEPHIN) 1 g in sodium chloride 0.9 % 100 mL IVPB        1 g 200 mL/hr over 30 Minutes Intravenous Every 24 hours 07/26/23 1440 07/31/23 1459   07/16/23 2200  vancomycin (VANCOREADY) IVPB 750 mg/150 mL  Status:  Discontinued        750 mg 150 mL/hr over 60 Minutes Intravenous Every 12 hours 07/16/23 0739 07/16/23 1243   07/16/23 2200  vancomycin (VANCOCIN) 750 mg in sodium chloride 0.9 % 250 mL IVPB  Status:  Discontinued       Note to Pharmacy: Indication: Sepsis   750 mg 265 mL/hr over 60 Minutes Intravenous Every 12 hours 07/16/23 1243 07/17/23 0821   07/16/23 0800  vancomycin (VANCOREADY) IVPB 1750 mg/350 mL        1,750 mg 175 mL/hr over 120 Minutes Intravenous  Once 07/16/23 0705 07/16/23 1137   07/16/23 0800  piperacillin-tazobactam (ZOSYN) IVPB 3.375 g  Status:  Discontinued        3.375 g 12.5 mL/hr over 240 Minutes Intravenous Every 8 hours 07/16/23 0705 07/17/23 0821        Objective: Vitals:   08/08/23 1000 08/08/23 1621 08/08/23 2032 08/09/23 0848  BP:  (!) 111/58 109/64 109/60  Pulse: (!) 58 (!) 52 (!) 47 63  Resp:  17 18 18   Temp:  98.4 F (36.9 C) 98.4 F (36.9 C) 98.4 F (36.9 C)  TempSrc:  Oral Oral   SpO2:  97% 96% 98%  Weight:      Height:        Intake/Output Summary (Last 24 hours) at 08/09/2023 1051 Last data filed at 08/09/2023 0958 Gross per 24 hour  Intake 118 ml  Output 500 ml  Net -382 ml   Filed Weights   08/05/23 0520 08/06/23 0500 08/07/23 0423  Weight: 80.6 kg 78.6 kg 80.1 kg   Examination: General exam: Appears somnolent this morning  Data Reviewed: I have personally reviewed following labs and imaging studies  CBC: Recent Labs  Lab 08/03/23 0718 08/09/23 0937  WBC 6.4 11.2*  HGB 10.3* 9.8*  HCT  32.2* 29.3*  MCV 91.0 90.4  PLT 138* 207   Basic Metabolic Panel: Recent Labs  Lab 08/03/23 0718  NA 134*  K 4.5  CL 102  CO2 22  GLUCOSE 106*  BUN 19  CREATININE 1.61*  CALCIUM 9.3  MG 1.5*  PHOS 4.2   GFR: Estimated Creatinine Clearance: 50.9 mL/min (A) (by C-G formula based on SCr of 1.61 mg/dL (H)). Liver Function Tests: Recent Labs  Lab 08/03/23 0718  AST 19  ALT 13  ALKPHOS 71  BILITOT 0.5  PROT 6.7  ALBUMIN 2.9*   No results for input(s): "LIPASE", "AMYLASE" in the last 168 hours. Recent Labs  Lab 08/09/23 0937  AMMONIA 42*   Coagulation Profile: No results for input(s): "INR", "PROTIME" in the last 168 hours. Cardiac Enzymes: No results for input(s): "CKTOTAL", "CKMB", "CKMBINDEX", "TROPONINI" in the last 168 hours. BNP (last 3 results) No results for input(s): "PROBNP" in the last 8760 hours. HbA1C: No results for input(s): "HGBA1C" in the last 72 hours. CBG: Recent Labs  Lab  08/08/23 1205 08/08/23 1618 08/08/23 2032 08/09/23 0015 08/09/23 0853  GLUCAP 182* 173* 116* 124* 160*   Lipid Profile: No results for input(s): "CHOL", "HDL", "LDLCALC", "TRIG", "CHOLHDL", "LDLDIRECT" in the last 72 hours. Thyroid Function Tests: No results for input(s): "TSH", "T4TOTAL", "FREET4", "T3FREE", "THYROIDAB" in the last 72 hours. Anemia Panel: No results for input(s): "VITAMINB12", "FOLATE", "FERRITIN", "TIBC", "IRON", "RETICCTPCT" in the last 72 hours. Sepsis Labs: No results for input(s): "PROCALCITON", "LATICACIDVEN" in the last 168 hours.  No results found for this or any previous visit (from the past 240 hours).    Radiology Studies: No results found.    Scheduled Meds:  acetaminophen  650 mg Oral Q6H   aspirin EC  81 mg Oral Daily   atorvastatin  80 mg Oral QHS   carvedilol  6.25 mg Oral BID WC   Chlorhexidine Gluconate Cloth  6 each Topical Daily   escitalopram  20 mg Oral Daily   famotidine  20 mg Oral Daily   heparin  5,000 Units  Subcutaneous Q8H   hydrALAZINE  10 mg Oral Q8H   HYDROmorphone  3 mg Oral Q6H   LORazepam  1 mg Intramuscular Once   magnesium oxide  400 mg Oral Daily   melatonin  3 mg Oral QHS   multivitamin with minerals  1 tablet Oral Daily   OLANZapine  10 mg Oral Daily   OLANZapine zydis  15 mg Oral QHS   OXcarbazepine  150 mg Oral BID   pantoprazole  40 mg Oral Daily   pregabalin  50 mg Oral BID   revefenacin  175 mcg Nebulization Daily   senna  1 tablet Oral Daily   tamsulosin  0.4 mg Oral Daily   thiamine  100 mg Oral Daily   Continuous Infusions:   LOS: 24 days   Time spent: 25 minutes   Noralee Stain, DO [facility] 08/09/2023, 10:51 AM   Available via Epic secure chat 7am-7pm After these hours, please refer to coverage provider listed on [email or web address]

## 2023-08-09 NOTE — Plan of Care (Signed)

## 2023-08-09 NOTE — Consult Note (Addendum)
 [facility] Psychiatric Consult Follow-up  Patient Name: .Gregory [name]  MRN: [id number]  DOB: 1960/07/08  Consult Order details: [place]/severe agitation.   Mode of Visit: In person   Psychiatry Consult Evaluation  Service Date: August 09, 2023 LOS:  LOS: 24 days  Chief Complaint "I'm in pain everywhere"  Primary Psychiatric Diagnoses  Hyperactive delirium  2.  Somatic symptom disorder with predominant pain 3. History of MDD   Assessment  Gregory [name] is a 63 y.o. male admitted: Medicallyfor 07/15/2023  3:05 PM for acute agitated encephalopathy. 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   His current presentation of inattention, waxing and waning mental status, change over the last couple of days is most consistent with hyperactive delirium likely in the setting of prior acute on chronic R PCA infarct, multiple medications, uncontrolled pain. There is suspicion for somatic symptom disorder with predominant pain given chart review notable for multiple outpatient visits to chronic pain physicians and continued use of oxy throughout the years though notably nothing on PDMP. Main psychiatric diagnosis over the years has been depression/anxiety and patient and collateral denies any past psychiatric hospitalizations or suicide attempts. He reports depression but is unable to state what medications he has been on and whether or not they have helped. Current outpatient psychotropic medications from last admission included scheduled lexapro 20, oxy 15 TID, lyrica 25 TID, 100 BID, seroquel 100 at bedtime, PRN haldol 5 Q6H, oxy 5 Q6H and historically he was fully alert and mostly oriented on these medications. He was likely compliant with medications prior to admission since he was at the nursing  home. On initial examination, patient is alert, irritable, is not able to participate in sustained attention testing and is reporting pain. Will recommend decreasing seroquel, stopping klonopin, continuing depakote, making sure his pain is well controlled. I suspect that he has a low frustration tolerance with regards to his pain which contributes to agitation - was put on several anticholinergic or otherwise deliriogenic meds when in hyperactive delirium - finding right balance has been major challenge of this admission. Globally alertness and participation in PT has improved w/ reduction of medications with setback w/ recent UTI. Patient continued to wax and wane between alertness and somnolence. When alert, he would interfere with medical care requiring restraints. Given ineffectiveness of seroquel and haldol, he was started on zyprexa. Discussed with daughter. With increase in repeat ammonia and thrombocytopenia, depakote was ultimately tapered and discontinued. He received zyprexa but per nurse was still agitated and did not sleep. On 2/14, patient appeared somnolent after getting total zyprexa 20mg  overnight. He was also started on klonopin at bedtime to assist with agitation and sleep and appeared improved. He was briefly out of restraints but then required restraints in the evening after attempting to get out of bed again. Patient's mental status continues to wax and wane. Patient unable to consent to medication however discussed with daughter starting trileptal to help with mood stabilization. She was in agreement.   Patient continues to be disoriented and intermittently agitated. He is oriented to self, year, somewhat to situation. We increased his scheduled zyprexa in the AM (2/21) since he is already getting increased amount of zyprexa during the day to attempt to treat his agitation and harm to self/others. Thankfully, repeat  EKG Qtc was wnl. Na slightly decreased at 131. Unfortunately appears his  sleep wake cycle is now switched since he is more drowsy during the day but more agitated at night.   Diagnoses:  Active Hospital problems: Principal Problem:   Acute encephalopathy Active Problems:   Malnutrition of moderate degree   Persistent hyperactive delirium due to multiple etiologies   Agitation    Plan  ## Psychiatric Medication Recommendations:  --Continue Zyprexa 15 mg at bedtime for insomnia, mood stabilization, hallucinations --Continue zyprexa 10mg  daily for agitation, mood stabilization, harm to self or others --continue lexapro 20 daily for depression  --Continue oxcarbazepine 150mg  BID for mood stabilization, irritability, agitation  2/21 Na 131   --optimize pain regimen  -- Continue decreased Zyprexa 5mg  PO daily PRN for severe agitation, would not give PRN ativan within 2 hours of administration due to risk of respiratory depression  --stop depakote 500 ER at bedtime for mood stabilization-due to hyperammonemia and thrombocytopenia --stop seroquel 100 QHS for mood stabilization and insomnia given ineffectiveness, concern for polypharmacy --stop melatonin 3mg  at bedtime for insomnia to reduce polypharmacy --stop klonopin 1 mg at bedtime to decrease deliriogenic agents   ## Medical Decision Making Capacity: Not specifically addressed in this encounter  ## Further Work-up:  - VPA level 41 (albumin low 2.5 most recently) - will hold on further increases given clinical effect d/t low albumin.  --repeat ammonia level 2/21 42 -- most recent EKG on 2/13 had Qtc of 422 -- Pertinent labwork reviewed earlier this admission includes: CMP (Cr 1.52), CBC, TSH, UDS  ## Disposition:-- There are no psychiatric contraindications to discharge at this time  ## Behavioral / Environmental: -Delirium Precautions: Delirium Interventions for Nursing and Staff: - RN to open blinds every AM. - To Bedside: Glasses, hearing aide, and pt's own shoes. Make available to patients. when  possible and encourage use. - Encourage po fluids when appropriate, keep fluids within reach. - OOB to chair with meals. - Passive ROM exercises to all extremities with AM & PM care. - RN to assess orientation to person, time and place QAM and PRN. - Recommend extended visitation hours with familiar family/friends as feasible. - Staff to minimize disturbances at night. Turn off television when pt asleep or when not in use.   ## Safety and Observation Level:  - Based on my clinical evaluation, I estimate the patient to be at moderate risk of self harm in the current setting due to agitation. - At this time, we recommend safety observation. This decision is based on my review of the chart including patient's history and current presentation, interview of the patient, mental status examination, and consideration of suicide risk including evaluating suicidal ideation, plan, intent, suicidal or self-harm behaviors, risk factors, and protective factors. This judgment is based on our ability to directly address suicide risk, implement suicide prevention strategies, and develop a safety plan while the patient is in the clinical setting. Please contact our team if there is a concern that risk level has changed.  CSSR Risk Category:C-SSRS RISK CATEGORY: No Risk  Suicide Risk Assessment: Patient has following modifiable risk factors for suicide: recklessness, which we are addressing by optimizing his medications Patient has following non-modifiable or demographic risk factors for suicide: male gender Patient has the following protective factors against suicide: Supportive family, no history of suicide attempts, and no history of NSSIB  Thank you for this consult request. Recommendations have been communicated to the primary team.  We will continue to follow  at this time.   Karie Fetch, MD, PGY-2                                                               History of Present Illness  Relevant Aspects of  [facility] Course:  Admitted on 07/15/2023 for acute agitated encephalopathy.  -Required precedex  -Acute encephalopathy thought to be sepsis related ?AKI vs CKD  -Received 7 day course of CTX for possible UTI  ON events:  VSS. HR 47. Patient did not receive evening trileptal, received PRN zyprexa in the AM. Labs pending. Refused dinner tray, was calm and cooperative throughout morning shift but drowsy. Worked with PT and able to carry appropriate conversation. Was confused but easy to redirect and reorient. Repeat ammonia 42, getting lactulose.   Patient Report:  Patient is seen in his bed. He is somnolent and not able to sustain attention to participate in interview.   Psych ROS:  Depression: reports always had depression Anxiety: reports anxiety  Mania (lifetime and current): denies  Psychosis: (lifetime and current): none  Collateral information:  Contacted daughter Gregory [name] 2/18 She reports patient has had increased agitation, reports he had increased agitation even towards her yesterday. She reports he had increased confusion, was talking about her uncle that died before she was born. Was asking for pain meds even though he just received pain meds. Discussed starting trileptal for mood stabilization, she reports she is aware of the medication and is open to starting this.   Called daughter Gregory [name] 2/12:  She reports patient is still not back to baseline, still appears confused. Reports he also mentioned seeing her son at the bedside but he is not there. He asks her if he's going crazy. Discussed changing seroquel to zyprexa to see if that will help with sleep. She reports he slept later even prior to all his medical issues. She also asks about thrombocytopenia and depakote.   Contacted daughter Gregory [name] at [phone] on 2/5 She reports when he was discharged from last hospitalization, he was confused but pleasant and calm. Was with him when he was admitted to hospital. States he is  not himself. States his agitation beforehand is if they were taking a long time to get his medication. He was more confused and didn't understand situation. Called her this morning and was with him last night, called her at 9pm. He was asking if she was coming to pick him up. He seemed confused about where he was last night. He seems a little more confused. Prior to admission, he was falling on the floor, trying to pee everywhere. Was not realizing where he was. On Monday nursing home told her he kept on falling in the floor. He didn't call her as he usually does. Still occasionally staring off when she was with him in the hospital. Trying to feed himself but weak. Held his pain medication. She reports she felt like he was a zombie yesterday. Sometimes need xanax when anxious. Yesterday he was telling her to clean up ice cream on floor. She thought perhaps he was bipolar. He can flip a switch really quickly, gets agitated easily. If he doesn't get what he wants he gets really mad, has always been like that. He's been a functional adult until his health declined. Struggles  with anxiety and depression from his health decline. Reports only psychotropic she is aware that he used to take was lexapro before all of his medical conditions.   ROS  Reports pain in his leg  Psychiatric and Social History  Psychiatric History:  Information collected from patient, chart review  Prev Dx/Sx: MDD Current Psych Provider: none Home Meds (current): scheduled lexapro 20, oxy 15 TID, lyrica 25 TID, 100 BID, seroquel 100 at bedtime, PRN haldol 5 Q6H, oxy 5 Q6H Previous Med Trials: wellbutrin, cymbalta, gabapentin Therapy: unknown  Prior Psych Hospitalization: denies  Prior Self Harm: denies Prior Violence: denies  Family Psych History: denies, reports sister struggles with substance use  Family Hx suicide: unknown  Social History:  Living Situation: Lives at [facility] for several years, no access to anything  else  Access to weapons/lethal means: none   Substance History Denies substance use history, UDS+opiates, BZD. Patient required ativan, versed.  -In the past he used marijuana frequently, but then became dependent on pain meds   Exam Findings  Vital Signs:  Temp:  [98.4 F (36.9 C)-98.5 F (36.9 C)] 98.4 F (36.9 C) (02/20 2032) Pulse Rate:  [47-58] 47 (02/20 2032) Resp:  [17-18] 18 (02/20 2032) BP: (107-111)/(58-66) 109/64 (02/20 2032) SpO2:  [96 %-98 %] 96 % (02/20 2032) Blood pressure 109/64, pulse (!) 47, temperature 98.4 F (36.9 C), temperature source Oral, resp. rate 18, height 6' (1.829 m), weight 80.1 kg, SpO2 96%. Body mass index is 23.95 kg/m.  Physical Exam Constitutional:      Appearance: He is obese.  HENT:     Head: Normocephalic.  Pulmonary:     Effort: Pulmonary effort is normal.  Neurological:     Mental Status: He is lethargic.    Mental Status Exam:  Orientation:  unable to assess   Memory:    unable to assess   Concentration:  Concentration: Poor  Recall:  Poor  Attention  Poor  Eye Contact:  Poor  Speech:   unable to assess   Language:   unable to assess   Volume:   unable to assess   Mood:  unable to assess   Affect:   labile  Thought Process:  poverty of thought  Thought Content:    unable to assess   Suicidal Thoughts:  unable to assess   Homicidal Thoughts:  unable to assess   Judgement:  poor  Insight:  limited  Psychomotor Activity:  Decreased  Akathisia:  No  Fund of Knowledge:    Denied perceptual disturbances - likely intermittent VH Assets: some ability to communicate needs  Cognition:  Impaired  ADL's:  Impaired  AIMS (if indicated):        Other History   These have been pulled in through the EMR, reviewed, and updated if appropriate.  Family History:  The patient's family history includes Bone cancer in his father; Cancer in his father, mother, paternal grandfather, and another family member; Diabetes in his mother and  another family member; Heart disease in his mother; Prostate cancer in his father.  Medical History: 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)    Acquired absence of right leg below knee (HCC)    Acute osteomyelitis of calcaneum, right (HCC) 10/02/2016   Acute respiratory failure (HCC)    Amputation stump infection (HCC) 08/12/2018   Anemia    Basal cell carcinoma, eyelid    Cancer (HCC)    Candidiasis 08/12/2018  CHF (congestive heart failure) (HCC)    chronic diastolic    Chronic back pain    Chronic kidney disease    stage 3    Chronic multifocal osteomyelitis (HCC)    of ankle and foot    Chronic pain syndrome    COPD (chronic obstructive pulmonary disease) (HCC)    DDD (degenerative disc disease), lumbar    Depression    major depressive disorder    Diabetes mellitus without complication (HCC)    type 2    Diabetic ulcer of toe of left foot (HCC) 10/02/2016   Difficult intubation    Difficulty in walking, not elsewhere classified    Epidural abscess 10/02/2016   Foot drop, bilateral    Foot drop, right 12/27/2015   GERD (gastroesophageal reflux disease)    Herpesviral vesicular dermatitis    History of COVID-19 03/15/2022   Hyperlipidemia    Hyperlipidemia    Hypertension    Insomnia    Malignant melanoma of other parts of face (HCC)    Melanoma (HCC)    MRSA bacteremia    Muscle weakness (generalized)    Nasal congestion    Necrosis of toe (HCC) 07/08/2018   Neuromuscular dysfunction of bladder    Osteomyelitis (HCC)    Osteomyelitis (HCC)    Osteomyelitis (HCC)    right BKA   Other idiopathic peripheral autonomic neuropathy    Retention of urine, unspecified    Squamous cell carcinoma of skin    Stroke (HCC)    Unsteadiness on feet    Urinary retention    Urinary retention    Urinary tract infection    Wears dentures    Wears glasses     Surgical History: Past Surgical History:  Procedure Laterality Date    AMPUTATION Left 03/29/2017   Procedure: LEFT GREAT TOE AMPUTATION AT METATARSOPHALANGEAL JOINT;  Surgeon: Nadara Mustard, MD;  Location: [facility] OR;  Service: Orthopedics;  Laterality: Left;   AMPUTATION Right 03/29/2017   Procedure: RIGHT BELOW KNEE AMPUTATION;  Surgeon: Nadara Mustard, MD;  Location: [facility] OR;  Service: Orthopedics;  Laterality: Right;   AMPUTATION Left 06/06/2018   Procedure: LEFT 2ND TOE AMPUTATION;  Surgeon: Nadara Mustard, MD;  Location: [facility] OR;  Service: Orthopedics;  Laterality: Left;   AMPUTATION Right 11/19/2018   Procedure: AMPUTATION ABOVE KNEE;  Surgeon: Nadara Mustard, MD;  Location: [facility] OR;  Service: Orthopedics;  Laterality: Right;   ANTERIOR CERVICAL CORPECTOMY N/A 11/25/2015   Procedure: ANTERIOR CERVICAL FIVE CORPECTOMY Cervical four - six fusion;  Surgeon: Lisbeth Renshaw, MD;  Location: [facility];  Service: Neurosurgery;  Laterality: N/A;  ANTERIOR CERVICAL FIVE CORPECTOMY Cervical four - six fusion   APPLICATION OF WOUND VAC Right 10/22/2018   Procedure: Application Of Wound Vac;  Surgeon: Nadara Mustard, MD;  [facility] OR;  Service: Orthopedics;  Laterality: Right;   CYSTOSCOPY WITH BIOPSY N/A 05/01/2022   Procedure: CYSTOSCOPY WITH URETHRAL BIOPSY;  Surgeon: Noel Christmas, MD;  Location: [facility] ORS;  Service: Urology;  Laterality: N/A;  1 HR   CYSTOSCOPY WITH RETROGRADE URETHROGRAM N/A 04/25/2018   Procedure: CYSTOSCOPY WITH RETROGRADE URETHROGRAM/ BALLOON DILATION;  Surgeon: Ihor Gully, MD;  Location: [facility] ORS;  Service: Urology;  Laterality: N/A;   CYSTOSCOPY WITH URETHRAL DILATATION N/A 05/01/2022   Procedure: BALLOON DILATION WITH  OPTILUME;  Surgeon: Noel Christmas, MD;  Location: [facility] ORS;  Service: Urology;  Laterality: N/A;   LAPAROTOMY N/A 12/03/2022   Procedure: EXPLORATORY LAPAROTOMY  total abdominal colectomy with end ileostomy;  Surgeon: Kinsinger, De Blanch, MD;  Location: [facility] OR;  Service: General;  Laterality: N/A;   MULTIPLE TOOTH EXTRACTIONS      POSTERIOR CERVICAL FUSION/FORAMINOTOMY N/A 11/29/2015   Procedure: Cervical Three-Cervical Seven Posterior Cervical Laminectomy with Fusion;  Surgeon: Lisbeth Renshaw, MD;  Location: [facility];  Service: Neurosurgery;  Laterality: N/A;  Cervical Three-Cervical Seven Posterior Cervical Laminectomy with Fusion   STUMP REVISION Right 10/22/2018   Procedure: REVISION RIGHT BELOW KNEE AMPUTATION;  Surgeon: Nadara Mustard, MD;  Location: [facility] OR;  Service: Orthopedics;  Laterality: Right;   STUMP REVISION Right 12/05/2018   Procedure: Revision Right Above Knee Amputation;  Surgeon: Nadara Mustard, MD;  Location: [facility] OR;  Service: Orthopedics;  Laterality: Right;   STUMP REVISION Right 05/29/2019   Procedure: REVISION RIGHT ABOVE KNEE AMPUTATION;  Surgeon: Nadara Mustard, MD;  Location: [facility] OR;  Service: Orthopedics;  Laterality: Right;   STUMP REVISION Right 06/24/2019   Procedure: STUMP REVISION;  Surgeon: Nadara Mustard, MD;  Location: [facility] OR;  Service: Orthopedics;  Laterality: Right;     Medications:   Current Facility-Administered Medications:    acetaminophen (TYLENOL) tablet 650 mg, 650 mg, Oral, Q6H, Lorin Glass, MD, 650 mg at 08/09/23 0300   alum & mag hydroxide-simeth (MAALOX/MYLANTA) 200-200-20 MG/5ML suspension 30 mL, 30 mL, Oral, Q4H PRN, Noralee Stain, DO, 30 mL at 08/07/23 1544   aspirin EC tablet 81 mg, 81 mg, Oral, Daily, Hunsucker, Lesia Sago, MD, 81 mg at 08/08/23 1001   atorvastatin (LIPITOR) tablet 80 mg, 80 mg, Oral, QHS, Autry, Lauren E, [name], 80 mg at 08/07/23 2125   carvedilol (COREG) tablet 12.5 mg, 12.5 mg, Oral, BID WC, Autry, Lauren E, [name], 12.5 mg at 08/08/23 1001   Chlorhexidine Gluconate Cloth 2 % PADS 6 each, 6 each, Topical, Daily, Lorin Glass, MD, 6 each at 08/08/23 1239   escitalopram (LEXAPRO) tablet 20 mg, 20 mg, Oral, Daily, Autry, Lauren E, [name], 20 mg at 08/08/23 1002   famotidine (PEPCID) tablet 20 mg, 20 mg, Oral, Daily, Suzie Portela, John D, [name], 20 mg at  08/08/23 1002   feeding supplement (ENSURE ENLIVE / ENSURE PLUS) liquid 237 mL, 237 mL, Oral, BID BM, Hunsucker, Lesia Sago, MD, 237 mL at 08/08/23 1002   heparin injection 5,000 Units, 5,000 Units, Subcutaneous, Q8H, Lidia Collum, [name], 5,000 Units at 08/08/23 1512   hydrALAZINE (APRESOLINE) injection 10-40 mg, 10-40 mg, Intravenous, Q4H PRN, Pia Mau D, [name]   hydrALAZINE (APRESOLINE) tablet 10 mg, 10 mg, Oral, Q8H, Autry, Lauren E, [name], 10 mg at 08/07/23 2125   HYDROmorphone (DILAUDID) tablet 3 mg, 3 mg, Oral, Q6H, Marolyn Haller, MD, 3 mg at 08/09/23 0300   ipratropium-albuterol (DUONEB) 0.5-2.5 (3) MG/3ML nebulizer solution 3 mL, 3 mL, Nebulization, Q4H PRN, Lidia Collum, [name], 3 mL at 08/05/23 7829   magnesium oxide (MAG-OX) tablet 400 mg, 400 mg, Oral, Daily, Karie Fetch, MD, 400 mg at 08/08/23 1007   melatonin tablet 3 mg, 3 mg, Oral, QHS, Karie Fetch, MD, 3 mg at 08/09/23 0222   multivitamin with minerals tablet 1 tablet, 1 tablet, Oral, Daily, Toni Amend W, DO, 1 tablet at 08/08/23 1001   OLANZapine zydis (ZYPREXA) disintegrating tablet 5 mg, 5 mg, Oral, Daily PRN **OR** OLANZapine (ZYPREXA) injection 5 mg, 5 mg, Intramuscular, Daily PRN, Karie Fetch, MD, 5 mg at 08/09/23 0431   OLANZapine (ZYPREXA) tablet 10 mg, 10 mg, Oral, Daily, Standley Brooking,  Judeth Cornfield, MD, 10 mg at 08/08/23 1015   OLANZapine zydis (ZYPREXA) disintegrating tablet 15 mg, 15 mg, Oral, QHS, Karie Fetch, MD, 15 mg at 08/09/23 0222   ondansetron (ZOFRAN-ODT) disintegrating tablet 4 mg, 4 mg, Oral, Q8H PRN, Marcelino Duster, MD, 4 mg at 08/05/23 1706   Oral care mouth rinse, 15 mL, Mouth Rinse, PRN, Kalman Shan, MD   OXcarbazepine (TRILEPTAL) tablet 150 mg, 150 mg, Oral, BID, Karie Fetch, MD, 150 mg at 08/08/23 1014   pantoprazole (PROTONIX) EC tablet 40 mg, 40 mg, Oral, Daily, Autry, Lauren E, [name], 40 mg at 08/08/23 1002   polyethylene glycol (MIRALAX / GLYCOLAX) packet 17 g, 17 g,  Oral, Daily PRN, Suzie Portela, John D, [name]   pregabalin (LYRICA) capsule 50 mg, 50 mg, Oral, BID, Marolyn Haller, MD, 50 mg at 08/09/23 0222   revefenacin (YUPELRI) nebulizer solution 175 mcg, 175 mcg, Nebulization, Daily, Lidia Collum, [name], 175 mcg at 08/04/23 0272   senna (SENOKOT) tablet 8.6 mg, 1 tablet, Oral, Daily, Marolyn Haller, MD, 8.6 mg at 08/08/23 1002   tamsulosin (FLOMAX) capsule 0.4 mg, 0.4 mg, Oral, Daily, Calkins, Derek W, DO, 0.4 mg at 08/08/23 1002   [COMPLETED] thiamine (VITAMIN B1) 500 mg in sodium chloride 0.9 % 50 mL IVPB, 500 mg, Intravenous, Q8H, Last Rate: 110 mL/hr at 07/21/23 0600, Infusion Verify at 07/21/23 0600 **FOLLOWED BY** thiamine (VITAMIN B1) tablet 100 mg, 100 mg, Oral, Daily, Lorin Glass, MD, 100 mg at 08/08/23 1002  Allergies: Allergies  Allergen Reactions   Latex Other (See Comments)    Unknown reaction    Karie Fetch, MD, PGY-2

## 2023-08-09 NOTE — Progress Notes (Addendum)
The patient started yelling, pulling his colostomy bag, and trying to get out of the bed.The  RN administered IM Ativan as ordered.  The RN notified  the MD of the patient's  situation,stating the patient was very combative with staff,  pulling his colostomy bag, and trying to get put of the bed every 5 minutes. Staff were concerned for safety. The  MD ordered  wrist restraints, and the RN  placed them.The  RN then notified the patient's Daughter, [name], via  phone.

## 2023-08-09 NOTE — Progress Notes (Signed)
Nutrition Follow-up  DOCUMENTATION CODES:   Non-severe (moderate) malnutrition in context of chronic illness  INTERVENTION:  Continue to encourage good oral intake. Continue with Ensure Plus High Protein po BID, each supplement provides 350 kcal and 20 grams of protein. Multivitamin/minerals Nutrition to sign off at this time. Re- consult should any new dietary concerns arise.   NUTRITION DIAGNOSIS:   Moderate Malnutrition related to chronic illness as evidenced by moderate fat depletion, moderate muscle depletion.    GOAL:   Patient will meet greater than or equal to 90% of their needs    MONITOR:   PO intake, Supplement acceptance, Weight trends  REASON FOR ASSESSMENT:   Consult Assessment of nutrition requirement/status  ASSESSMENT:   Pt presented to ED from SNF with agitated delirium. Pt admitted for acute encephalopathy. Recent admission from 1/9-1/23 for septic shock and AMS secondary to ESBL UTI and serratia/klebsiella multifocal pneumonia/positive rhinovirus. 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 Patient reports he does not want Ensures. Stated that he feels as though he is getting enough to eat.  Has an 80-100% average intake of most meals.  Suspect supplemental nutrition is no longer necessary at this time, due to good oral intake.  Suspect patient is at a good nutritional base line status and longer needs to be followed by nutrition at this time.  Please re consult should nutritional goals or status change.  Hospital weight history: 08/07/23 0423 80.1 kg 176.59 lbs  08/06/23 0500 78.6 kg 173.28 lbs  08/05/23 0520 80.6 kg 177.69 lbs  08/04/23 0500 80.9 kg 178.35 lbs  08/03/23 05:17:01 80.5 kg 177.47 lbs  07/31/23 0500 79.3 kg 174.9 lbs  07/30/23 05:01:40 79.6 kg 175.49 lbs  07/29/23 0135 79.9 kg 176.15 lbs   07/28/23 0500 81.7 kg 180.12 lbs  07/27/23 0500 82.4 kg 181.66 lbs  07/26/23 0500 84 kg 185.19 lbs  07/25/23 05:45:35 83.1 kg 183.2 lbs  07/24/23 0500 84.9 kg 187.17 lbs  07/23/23 04:00:05 85.6 kg 188.71 lbs  07/22/23 0343 82.2 kg 181.22 lbs  07/21/23 0500 82.2 kg 181.22 lbs  07/20/23 0500 83.4 kg 183.86 lbs  07/16/23 0930 81.7 kg 180.12 lbs  07/16/23 0637 80.6 kg 177.69 lbs     NUTRITION - FOCUSED PHYSICAL EXAM:  Flowsheet Row Most Recent Value  Orbital Region Mild depletion  Upper Arm Region Moderate depletion  Thoracic and Lumbar Region Moderate depletion  Buccal Region Mild depletion  Temple Region Moderate depletion  Clavicle Bone Region Moderate depletion  Clavicle and Acromion Bone Region Moderate depletion  Scapular Bone Region Severe depletion  Dorsal Hand Unable to assess  Patellar Region Severe depletion  Anterior Thigh Region Severe depletion  Posterior Calf Region Severe depletion  [n/a on right side due to AKA]  Edema (RD Assessment) Mild  Hair Reviewed  Eyes Reviewed  Mouth Reviewed  Skin Reviewed  Nails Reviewed       Diet Order:   Diet Order             Diet regular Room service appropriate? Yes; Fluid consistency: Thin  Diet effective now                   EDUCATION NEEDS:   Not appropriate for education at this time  Skin:  Skin Assessment: Skin Integrity Issues: Skin Integrity Issues:: Stage I, Other (Comment) Stage I: sacrum Other: venous stasis  ulcers to left foot/ankle  Last BM:  08/08/23 type 7  Height:   Ht Readings from Last 1 Encounters:  07/16/23 6' (1.829 m)    Weight:   Wt Readings from Last 1 Encounters:  08/07/23 80.1 kg    Ideal Body Weight:     BMI:  Body mass index is 23.95 kg/m.  Estimated Nutritional Needs:   Kcal:  2100-2300  Protein:  110-120 g  Fluid:  >/= 2.1 L    Jamelle Haring RDN, LDN Clinical Dietitian   If unable to reach, please contact "[facility] Inpatient" secure chat group between 8  am-4 pm daily"

## 2023-08-10 LAB — GLUCOSE, CAPILLARY
Glucose-Capillary: 117 mg/dL — ABNORMAL HIGH (ref 70–99)
Glucose-Capillary: 158 mg/dL — ABNORMAL HIGH (ref 70–99)
Glucose-Capillary: 164 mg/dL — ABNORMAL HIGH (ref 70–99)
Glucose-Capillary: 155 mg/dL — ABNORMAL HIGH (ref 70–99)

## 2023-08-10 NOTE — Plan of Care (Signed)
  Problem: Safety: Goal: Non-violent Restraint(s) Outcome: Progressing   Problem: Clinical Measurements: Goal: Ability to maintain clinical measurements within normal limits will improve Outcome: Progressing Goal: Will remain free from infection Outcome: Progressing Goal: Diagnostic test results will improve Outcome: Progressing   Problem: Nutrition: Goal: Adequate nutrition will be maintained Outcome: Progressing   Problem: Elimination: Goal: Will not experience complications related to bowel motility Outcome: Progressing   Problem: Pain Managment: Goal: General experience of comfort will improve and/or be controlled Outcome: Progressing   Problem: Safety: Goal: Ability to remain free from injury will improve Outcome: Progressing   Problem: Skin Integrity: Goal: Risk for impaired skin integrity will decrease Outcome: Progressing

## 2023-08-10 NOTE — Plan of Care (Signed)

## 2023-08-10 NOTE — Progress Notes (Signed)
 PROGRESS NOTE    Gregory [name]  GNF:[id number] DOB: 1961/05/12 DOA: 07/15/2023 PCP: [name], Provider, MD     Brief Narrative:  Gregory [name] is a 63 year old male with history of diabetes mellitus type 2, hypertension, hyperlipidemia, chronic kidney disease stage IIIb, COPD, chronic osteomyelitis status post right AKA, left midfoot amputation, unspecified stroke, A-fib, chronic diastolic heart failure, mesenteric ischemia status post colostomy and recent hospitalization from 06/27/2023 to 07/11/2023 for septic shock due to ESBL UTI and multifocal pneumonia with rhinovirus infection/ acute respiratory failure with hypoxia requiring intubation and extubation/ acute encephalopathy/ acute ischemic stroke and subsequent discharge to SNF presented with severe agitation and delirium.    He was admitted to ICU and managed with Precedex.  He was transferred to [facility] service from 07/23/2023 onwards. Palliative care and psychiatry following. Currently pending SNF placement once mental status better.   He is intermittently agitates, delirious requiring restraints, haldol. Psych team following him and making medication changes.  New events last 24 hours / Subjective: 1x stool recorded yesterday.  Continue lactulose.  Had episode early this morning of agitation and yelling.  Removed colostomy bag and pushed dinner tray onto the floor.  Wrist restraints were reapplied yesterday afternoon.  Patient fell asleep around 4:30 AM, remains somnolent on my examination.  His delirium waxes and wanes   Assessment & Plan:   Principal Problem:   Acute encephalopathy Active Problems:   Malnutrition of moderate degree   Persistent hyperactive delirium due to multiple etiologies   Agitation   Persistent encephalopathy Hyperactive delirium At baseline when discharged 07/11/2023: Patient was lucid, able to feed, and bathe himself. He lives at [facility]. His daughter makes decisions for his care. Initially admitted to ICU,  required Precedex. Ammonia, VBG with no abnormalities to explain patient's presentation. CT of the head was negative for acute intracranial abnormality. Currently no obvious medical reasons to explain delirium.     Ammonia is elevated despite discontinuing Depakote.  Lactulose started. Wrist restraints in place.   Possible Urinary Tract Infection Finished 5 days of ceftriaxone.    COPD not in acute exacerbation  Stable. Continue current nebulized regimen   HFpEF  Hypertension Hyperlipidemia Hypotension Resolved  BP remains stable Continue Coreg, hydralazine and statin.     Diabetes mellitus type 2 Hypoglycemia resolved Well controlled. Has had some low blood sugars in the setting of poor oral intake.  CBG checks per hypoglycemia protocol.   Acute kidney injury CKD stage IIIb Kidney function at baseline. Avoid nephrotoxic medicaitons.    Chronic pain syndrome Continue current regimen. Continue dilaudid    Depression/anxiety Continue current regimen per psychiatry     Anemia of chronic disease Possibly from chronic kidney disease. No iron deficiency. Vit B12 WNL.  Hgb stable    Thrombocytopenia  Resolved    Goals of care Palliative care and medical team had discussion with patient's daughter. She would like patient to remain full code at this time as this was his wishes prior to his confusion.   Daughter wishes to get a Engineer, site for patient once he goes back to [facility].    Generalized deconditioning PT recommending SNF placement.  Back to [facility] if mental status better. TOC aware.    DVT prophylaxis:  heparin injection 5,000 Units Start: 07/16/23 0600  Code Status: Full Family Communication: None at bedside Disposition Plan: SNF Status is: Inpatient Remains inpatient appropriate because: delirium.  He needs to be out of his mittens and telemetry sitter/sitter discontinued for  48 hours prior to discharge.    Antimicrobials:  Anti-infectives  (From admission, onward)    Start     Dose/Rate Route Frequency Ordered Stop   07/26/23 1530  sulfamethoxazole-trimethoprim (BACTRIM DS) 800-160 MG per tablet 1 tablet  Status:  Discontinued        1 tablet Oral Every 12 hours 07/26/23 1439 07/26/23 1439   07/26/23 1500  cefTRIAXone (ROCEPHIN) 1 g in sodium chloride 0.9 % 100 mL IVPB        1 g 200 mL/hr over 30 Minutes Intravenous Every 24 hours 07/26/23 1440 07/31/23 1459   07/16/23 2200  vancomycin (VANCOREADY) IVPB 750 mg/150 mL  Status:  Discontinued        750 mg 150 mL/hr over 60 Minutes Intravenous Every 12 hours 07/16/23 0739 07/16/23 1243   07/16/23 2200  vancomycin (VANCOCIN) 750 mg in sodium chloride 0.9 % 250 mL IVPB  Status:  Discontinued       Note to Pharmacy: Indication: Sepsis   750 mg 265 mL/hr over 60 Minutes Intravenous Every 12 hours 07/16/23 1243 07/17/23 0821   07/16/23 0800  vancomycin (VANCOREADY) IVPB 1750 mg/350 mL        1,750 mg 175 mL/hr over 120 Minutes Intravenous  Once 07/16/23 0705 07/16/23 1137   07/16/23 0800  piperacillin-tazobactam (ZOSYN) IVPB 3.375 g  Status:  Discontinued        3.375 g 12.5 mL/hr over 240 Minutes Intravenous Every 8 hours 07/16/23 0705 07/17/23 0821        Objective: Vitals:   08/09/23 2028 08/10/23 0436 08/10/23 0516 08/10/23 0758  BP: (!) 144/97  (!) 105/57 (!) 108/50  Pulse: 68  60 (!) 59  Resp: 18  18 18   Temp: (!) 97.4 F (36.3 C)  98.2 F (36.8 C) 98.2 F (36.8 C)  TempSrc: Oral  Oral   SpO2: 98%  97% 99%  Weight:  79.7 kg    Height:        Intake/Output Summary (Last 24 hours) at 08/10/2023 1103 Last data filed at 08/10/2023 0903 Gross per 24 hour  Intake 656 ml  Output 700 ml  Net -44 ml   Filed Weights   08/06/23 0500 08/07/23 0423 08/10/23 0436  Weight: 78.6 kg 80.1 kg 79.7 kg   Examination: General exam: Appears somnolent this morning  Data Reviewed: I have personally reviewed following labs and imaging studies  CBC: Recent Labs  Lab  08/09/23 0937  WBC 11.2*  HGB 9.8*  HCT 29.3*  MCV 90.4  PLT 207   Basic Metabolic Panel: Recent Labs  Lab 08/09/23 0937  NA 131*  K 4.8  CL 100  CO2 19*  GLUCOSE 114*  BUN 27*  CREATININE 1.66*  CALCIUM 8.9   GFR: Estimated Creatinine Clearance: 49.3 mL/min (A) (by C-G formula based on SCr of 1.66 mg/dL (H)). Liver Function Tests: Recent Labs  Lab 08/09/23 0937  AST 14*  ALT 14  ALKPHOS 70  BILITOT 0.4  PROT 6.3*  ALBUMIN 2.9*   No results for input(s): "LIPASE", "AMYLASE" in the last 168 hours. Recent Labs  Lab 08/09/23 0937  AMMONIA 42*   Coagulation Profile: No results for input(s): "INR", "PROTIME" in the last 168 hours. Cardiac Enzymes: No results for input(s): "CKTOTAL", "CKMB", "CKMBINDEX", "TROPONINI" in the last 168 hours. BNP (last 3 results) No results for input(s): "PROBNP" in the last 8760 hours. HbA1C: No results for input(s): "HGBA1C" in the last 72 hours. CBG: Recent Labs  Lab 08/09/23  1610 08/09/23 1134 08/09/23 1824 08/09/23 2132 08/10/23 0755  GLUCAP 160* 110* 149* 197* 117*   Lipid Profile: No results for input(s): "CHOL", "HDL", "LDLCALC", "TRIG", "CHOLHDL", "LDLDIRECT" in the last 72 hours. Thyroid Function Tests: No results for input(s): "TSH", "T4TOTAL", "FREET4", "T3FREE", "THYROIDAB" in the last 72 hours. Anemia Panel: No results for input(s): "VITAMINB12", "FOLATE", "FERRITIN", "TIBC", "IRON", "RETICCTPCT" in the last 72 hours. Sepsis Labs: No results for input(s): "PROCALCITON", "LATICACIDVEN" in the last 168 hours.  No results found for this or any previous visit (from the past 240 hours).    Radiology Studies: No results found.    Scheduled Meds:  aspirin EC  81 mg Oral Daily   atorvastatin  80 mg Oral QHS   carvedilol  6.25 mg Oral BID WC   Chlorhexidine Gluconate Cloth  6 each Topical Daily   escitalopram  20 mg Oral Daily   famotidine  20 mg Oral Daily   heparin  5,000 Units Subcutaneous Q8H    hydrALAZINE  10 mg Oral Q8H   HYDROmorphone  3 mg Oral Q6H   lactulose  10 g Oral BID   magnesium oxide  400 mg Oral Daily   multivitamin with minerals  1 tablet Oral Daily   OLANZapine  10 mg Oral Daily   OLANZapine zydis  15 mg Oral QHS   OXcarbazepine  150 mg Oral BID   pantoprazole  40 mg Oral Daily   pregabalin  50 mg Oral BID   senna  1 tablet Oral Daily   tamsulosin  0.4 mg Oral Daily   thiamine  100 mg Oral Daily   Continuous Infusions:   LOS: 25 days   Time spent: 25 minutes   Noralee Stain, DO [facility] 08/10/2023, 11:03 AM   Available via Epic secure chat 7am-7pm After these hours, please refer to coverage provider listed on [email or web address]

## 2023-08-10 NOTE — Progress Notes (Signed)
 Patient alert and oriented to self only. Patient agitated and yelling for the entire night. Medicated as per orders with minimal effect. Patient rook some medication but refused others. Patient removed colostomy bag and pushed dinner tray onto floor breaking plate. Soft wrist restraints reapplied as per orders. No physical distress noted. Patient bathed and colostomy bag replaced. Patient fell asleep at 0430AM. Did not wake patient for 6 AM medication; rescheduled for later in morning.

## 2023-08-11 DIAGNOSIS — F451 Undifferentiated somatoform disorder: Secondary | ICD-10-CM | POA: Diagnosis not present

## 2023-08-11 DIAGNOSIS — Z515 Encounter for palliative care: Secondary | ICD-10-CM | POA: Diagnosis not present

## 2023-08-11 DIAGNOSIS — R410 Disorientation, unspecified: Secondary | ICD-10-CM | POA: Diagnosis not present

## 2023-08-11 DIAGNOSIS — Z7189 Other specified counseling: Secondary | ICD-10-CM | POA: Diagnosis not present

## 2023-08-11 DIAGNOSIS — Z8659 Personal history of other mental and behavioral disorders: Secondary | ICD-10-CM | POA: Diagnosis not present

## 2023-08-11 LAB — GLUCOSE, CAPILLARY
Glucose-Capillary: 138 mg/dL — ABNORMAL HIGH (ref 70–99)
Glucose-Capillary: 145 mg/dL — ABNORMAL HIGH (ref 70–99)
Glucose-Capillary: 168 mg/dL — ABNORMAL HIGH (ref 70–99)
Glucose-Capillary: 185 mg/dL — ABNORMAL HIGH (ref 70–99)

## 2023-08-11 MED ORDER — DICLOFENAC SODIUM 1 % EX GEL
2.0000 g | Freq: Four times a day (QID) | CUTANEOUS | Status: DC | PRN
  Administered 2023-08-11 (×3): 2 g via TOPICAL
  Filled 2023-08-11: qty 100

## 2023-08-11 MED ORDER — HALOPERIDOL LACTATE 5 MG/ML IJ SOLN
5.0000 mg | Freq: Once | INTRAMUSCULAR | Status: AC
Start: 2023-08-11 — End: 2023-08-11
  Administered 2023-08-11: 5 mg via INTRAVENOUS
  Filled 2023-08-11: qty 1

## 2023-08-11 MED ORDER — DICLOFENAC SODIUM 1 % EX GEL: 2.0000 g | Freq: Four times a day (QID) | CUTANEOUS | Status: DC | PRN

## 2023-08-11 MED ORDER — TRAMADOL HCL 50 MG PO TABS
50.0000 mg | ORAL_TABLET | Freq: Four times a day (QID) | ORAL | Status: DC | PRN
  Administered 2023-08-11 – 2023-08-13 (×4): 50 mg via ORAL
  Filled 2023-08-11 (×4): qty 1

## 2023-08-11 NOTE — Consult Note (Signed)
 [facility] Psychiatric Consult Follow-up  Patient Name: .Gregory [name]  MRN: [id number]  DOB: February 07, 1961  Consult Order details: [place]/severe agitation.   Mode of Visit: In person   Psychiatry Consult Evaluation  Service Date: August 11, 2023 LOS:  LOS: 26 days  Chief Complaint "I'm in pain everywhere"  Primary Psychiatric Diagnoses  Hyperactive delirium  2.  Somatic symptom disorder with predominant pain 3. History of MDD   Assessment  Gregory [name] is a 63 y.o. male admitted: Medicallyfor 07/15/2023  3:05 PM for acute agitated encephalopathy. 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   His current presentation of inattention, waxing and waning mental status, change over the last couple of days is most consistent with hyperactive delirium likely in the setting of prior acute on chronic R PCA infarct, multiple medications, uncontrolled pain. There is suspicion for somatic symptom disorder with predominant pain given chart review notable for multiple outpatient visits to chronic pain physicians and continued use of oxy throughout the years though notably nothing on PDMP. Main psychiatric diagnosis over the years has been depression/anxiety and patient and collateral denies any past psychiatric hospitalizations or suicide attempts. He reports depression but is unable to state what medications he has been on and whether or not they have helped. Current outpatient psychotropic medications from last admission included scheduled lexapro 20, oxy 15 TID, lyrica 25 TID, 100 BID, seroquel 100 at bedtime, PRN haldol 5 Q6H, oxy 5 Q6H and historically he was fully alert and mostly oriented on these medications. He was likely compliant with medications prior to admission since he was at the nursing  home. On initial examination, patient is alert, irritable, is not able to participate in sustained attention testing and is reporting pain. Will recommend decreasing seroquel, stopping klonopin, continuing depakote, making sure his pain is well controlled. I suspect that he has a low frustration tolerance with regards to his pain which contributes to agitation - was put on several anticholinergic or otherwise deliriogenic meds when in hyperactive delirium - finding right balance has been major challenge of this admission. Globally alertness and participation in PT has improved w/ reduction of medications with setback w/ recent UTI. Patient continued to wax and wane between alertness and somnolence. When alert, he would interfere with medical care requiring restraints. Given ineffectiveness of seroquel and haldol, he was started on zyprexa. Discussed with daughter. With increase in repeat ammonia and thrombocytopenia, depakote was ultimately tapered and discontinued. He received zyprexa but per nurse was still agitated and did not sleep. On 2/14, patient appeared somnolent after getting total zyprexa 20mg  overnight. He was also started on klonopin at bedtime to assist with agitation and sleep and appeared improved. He was briefly out of restraints but then required restraints in the evening after attempting to get out of bed again. Patient's mental status continues to wax and wane. Patient unable to consent to medication however discussed with daughter starting trileptal to help with mood stabilization. She was in agreement.   Patient continues to be disoriented and intermittently agitated. He is oriented to self, year, somewhat to situation. We increased his scheduled zyprexa in the AM (2/21) since he is already getting increased amount of zyprexa during the day to attempt to treat his agitation and harm to self/others. Thankfully, repeat  EKG Qtc was wnl. Na slightly decreased at 131. Unfortunately appears his  sleep wake cycle is now switched since he is more drowsy during the day but more agitated at night.   08/11/2023: Patient seen face to face in his hospital room. He is awake, alert and oriented x 3. Patient reports he did not sleep well overnight despite current dose of Olanzapine 15 mg at bedtime. Patient is on soft restraints and requested to be taken off. Collateral information from the treating nurse indicates that the soft restraints were removed this morning but patient continued to pull out his Colostomy bag. This Clinical research associate advocated for patient to be free of restraints and patient agrees not to pull out the bag once the restraints are removed. He remains interested to be discharged to [name].    Diagnoses:  Active Hospital problems: Principal Problem:   Acute encephalopathy Active Problems:   Malnutrition of moderate degree   Persistent hyperactive delirium due to multiple etiologies   Agitation    Plan  ## Psychiatric Medication Recommendations:  --Continue Zyprexa 15 mg at bedtime for insomnia, mood stabilization, hallucinations --Continue zyprexa 10mg  daily for agitation, mood stabilization, harm to self or others --continue lexapro 20 daily for depression  --Continue oxcarbazepine 150mg  BID for mood stabilization, irritability, agitation  2/21 Na 131   --optimize pain regimen  -- Continue decreased Zyprexa 5mg  PO daily PRN for severe agitation, would not give PRN ativan within 2 hours of administration due to risk of respiratory depression  --stop depakote 500 ER at bedtime for mood stabilization-due to hyperammonemia and thrombocytopenia --stop seroquel 100 QHS for mood stabilization and insomnia given ineffectiveness, concern for polypharmacy --stop melatonin 3mg  at bedtime for insomnia to reduce polypharmacy --stop klonopin 1 mg at bedtime to decrease deliriogenic agents   ## Medical Decision Making Capacity: Not specifically addressed in this encounter  ## Further  Work-up:  - VPA level 41 (albumin low 2.5 most recently) - will hold on further increases given clinical effect d/t low albumin.  --repeat ammonia level 2/21 42 -- most recent EKG on 2/13 had Qtc of 422 -- Pertinent labwork reviewed earlier this admission includes: CMP (Cr 1.52), CBC, TSH, UDS  ## Disposition:-- There are no psychiatric contraindications to discharge at this time  ## Behavioral / Environmental: -Delirium Precautions: Delirium Interventions for Nursing and Staff: - RN to open blinds every AM. - To Bedside: Glasses, hearing aide, and pt's own shoes. Make available to patients. when possible and encourage use. - Encourage po fluids when appropriate, keep fluids within reach. - OOB to chair with meals. - Passive ROM exercises to all extremities with AM & PM care. - RN to assess orientation to person, time and place QAM and PRN. - Recommend extended visitation hours with familiar family/friends as feasible. - Staff to minimize disturbances at night. Turn off television when pt asleep or when not in use.   ## Safety and Observation Level:  - Based on my clinical evaluation, I estimate the patient to be at moderate risk of self harm in the current setting due to agitation. - At this time, we recommend safety observation. This decision is based on my review of the chart including patient's history and current presentation, interview of the patient, mental status examination, and consideration of suicide risk including evaluating suicidal ideation, plan, intent, suicidal or self-harm behaviors, risk factors, and protective factors. This judgment is based on our ability to directly address suicide risk, implement suicide prevention strategies, and develop a  safety plan while the patient is in the clinical setting. Please contact our team if there is a concern that risk level has changed.  CSSR Risk Category:C-SSRS RISK CATEGORY: No Risk  Suicide Risk Assessment: Patient has following  modifiable risk factors for suicide: recklessness, which we are addressing by optimizing his medications Patient has following non-modifiable or demographic risk factors for suicide: male gender Patient has the following protective factors against suicide: Supportive family, no history of suicide attempts, and no history of NSSIB  Thank you for this consult request. Recommendations have been communicated to the primary team.  We will continue to follow at this time.   Fredonia Highland, MD, PGY-2                                                               History of Present Illness  Relevant Aspects of [facility] Course:  Admitted on 07/15/2023 for acute agitated encephalopathy.  -Required precedex  -Acute encephalopathy thought to be sepsis related ?AKI vs CKD  -Received 7 day course of CTX for possible UTI  ON events:  VSS. HR 47. Patient did not receive evening trileptal, received PRN zyprexa in the AM. Labs pending. Refused dinner tray, was calm and cooperative throughout morning shift but drowsy. Worked with PT and able to carry appropriate conversation. Was confused but easy to redirect and reorient. Repeat ammonia 42, getting lactulose.   Patient Report:  Patient is seen in his bed. He is somnolent and not able to sustain attention to participate in interview.   Psych ROS:  Depression: reports always had depression Anxiety: reports anxiety  Mania (lifetime and current): denies  Psychosis: (lifetime and current): none  Collateral information:  Contacted daughter [name] 2/18 She reports patient has had increased agitation, reports he had increased agitation even towards her yesterday. She reports he had increased confusion, was talking about her uncle that died before she was born. Was asking for pain meds even though he just received pain meds. Discussed starting trileptal for mood stabilization, she reports she is aware of the medication and is open to starting this.    Called daughter [name] 2/12:  She reports patient is still not back to baseline, still appears confused. Reports he also mentioned seeing her son at the bedside but he is not there. He asks her if he's going crazy. Discussed changing seroquel to zyprexa to see if that will help with sleep. She reports he slept later even prior to all his medical issues. She also asks about thrombocytopenia and depakote.   Contacted daughter [name] at [phone] on 2/5 She reports when he was discharged from last hospitalization, he was confused but pleasant and calm. Was with him when he was admitted to hospital. States he is not himself. States his agitation beforehand is if they were taking a long time to get his medication. He was more confused and didn't understand situation. Called her this morning and was with him last night, called her at 9pm. He was asking if she was coming to pick him up. He seemed confused about where he was last night. He seems a little more confused. Prior to admission, he was falling on the floor, trying to pee everywhere. Was not realizing where he was. On Monday nursing home told  her he kept on falling in the floor. He didn't call her as he usually does. Still occasionally staring off when she was with him in the hospital. Trying to feed himself but weak. Held his pain medication. She reports she felt like he was a zombie yesterday. Sometimes need xanax when anxious. Yesterday he was telling her to clean up ice cream on floor. She thought perhaps he was bipolar. He can flip a switch really quickly, gets agitated easily. If he doesn't get what he wants he gets really mad, has always been like that. He's been a functional adult until his health declined. Struggles with anxiety and depression from his health decline. Reports only psychotropic she is aware that he used to take was lexapro before all of his medical conditions.   ROS  Reports pain in his leg  Psychiatric and Social History   Psychiatric History:  Information collected from patient, chart review  Prev Dx/Sx: MDD Current Psych Provider: none Home Meds (current): scheduled lexapro 20, oxy 15 TID, lyrica 25 TID, 100 BID, seroquel 100 at bedtime, PRN haldol 5 Q6H, oxy 5 Q6H Previous Med Trials: wellbutrin, cymbalta, gabapentin Therapy: unknown  Prior Psych Hospitalization: denies  Prior Self Harm: denies Prior Violence: denies  Family Psych History: denies, reports sister struggles with substance use  Family Hx suicide: unknown  Social History:  Living Situation: Lives at [facility] for several years, no access to anything else  Access to weapons/lethal means: none   Substance History Denies substance use history, UDS+opiates, BZD. Patient required ativan, versed.  -In the past he used marijuana frequently, but then became dependent on pain meds   Exam Findings  Vital Signs:  Temp:  [98.9 F (37.2 C)-99.3 F (37.4 C)] 98.9 F (37.2 C) (02/23 0901) Pulse Rate:  [65-66] 65 (02/23 0901) Resp:  [18] 18 (02/23 0901) BP: (128-144)/(68-75) 128/75 (02/23 0901) SpO2:  [99 %-100 %] 99 % (02/23 0901) Weight:  [78 kg] 78 kg (02/23 0500) Blood pressure 128/75, pulse 65, temperature 98.9 F (37.2 C), resp. rate 18, height 6' (1.829 m), weight 78 kg, SpO2 99%. Body mass index is 23.32 kg/m.  Physical Exam Constitutional:      Appearance: He is obese.  HENT:     Head: Normocephalic.  Pulmonary:     Effort: Pulmonary effort is normal.  Neurological:     Mental Status: He is lethargic.    Mental Status Exam:  Orientation:  unable to assess   Memory:    unable to assess   Concentration:  Concentration: Poor  Recall:  Poor  Attention  Poor  Eye Contact:  Poor  Speech:   unable to assess   Language:   unable to assess   Volume:   unable to assess   Mood:  unable to assess   Affect:   labile  Thought Process:  poverty of thought  Thought Content:    unable to assess   Suicidal Thoughts:  unable  to assess   Homicidal Thoughts:  unable to assess   Judgement:  poor  Insight:  limited  Psychomotor Activity:  Decreased  Akathisia:  No  Fund of Knowledge:    Denied perceptual disturbances - likely intermittent VH Assets: some ability to communicate needs  Cognition:  Impaired  ADL's:  Impaired  AIMS (if indicated):        Other History   These have been pulled in through the EMR, reviewed, and updated if appropriate.  Family History:  The patient's  family history includes Bone cancer in his father; Cancer in his father, mother, paternal grandfather, and another family member; Diabetes in his mother and another family member; Heart disease in his mother; Prostate cancer in his father.  Medical History: 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    Chronic pain syndrome    COPD (chronic obstructive pulmonary disease) (HCC)    DDD (degenerative disc disease), lumbar    Depression    major depressive disorder    Diabetes mellitus without complication (HCC)    type 2    Diabetic ulcer of toe of left foot (HCC) 10/02/2016   Difficult intubation    Difficulty in walking, not elsewhere classified    Epidural abscess 10/02/2016   Foot drop, bilateral    Foot drop, right 12/27/2015   GERD (gastroesophageal reflux disease)    Herpesviral vesicular dermatitis    History of COVID-19 03/15/2022   Hyperlipidemia    Hyperlipidemia    Hypertension    Insomnia    Malignant melanoma of  other parts of face (HCC)    Melanoma (HCC)    MRSA bacteremia    Muscle weakness (generalized)    Nasal congestion    Necrosis of toe (HCC) 07/08/2018   Neuromuscular dysfunction of bladder    Osteomyelitis (HCC)    Osteomyelitis (HCC)    Osteomyelitis (HCC)    right BKA   Other idiopathic peripheral autonomic neuropathy    Retention of urine, unspecified    Squamous cell carcinoma of skin    Stroke (HCC)    Unsteadiness on feet    Urinary retention    Urinary retention    Urinary tract infection    Wears dentures    Wears glasses     Surgical History: Past Surgical History:  Procedure Laterality Date   AMPUTATION Left 03/29/2017   Procedure: LEFT GREAT TOE AMPUTATION AT METATARSOPHALANGEAL JOINT;  Surgeon: Nadara Mustard, MD;  Location: [facility] OR;  Service: Orthopedics;  Laterality: Left;   AMPUTATION Right 03/29/2017   Procedure: RIGHT BELOW KNEE AMPUTATION;  Surgeon: Nadara Mustard, MD;  Location: [facility] OR;  Service: Orthopedics;  Laterality: Right;   AMPUTATION Left 06/06/2018   Procedure: LEFT 2ND TOE AMPUTATION;  Surgeon: Nadara Mustard, MD;  Location: [facility] OR;  Service: Orthopedics;  Laterality: Left;   AMPUTATION Right 11/19/2018   Procedure: AMPUTATION ABOVE KNEE;  Surgeon: Nadara Mustard, MD;  Location: [facility] OR;  Service: Orthopedics;  Laterality: Right;   ANTERIOR CERVICAL CORPECTOMY N/A 11/25/2015   Procedure: ANTERIOR CERVICAL FIVE CORPECTOMY Cervical four - six fusion;  Surgeon: Lisbeth Renshaw, MD;  Location: [facility];  Service: Neurosurgery;  Laterality: N/A;  ANTERIOR CERVICAL FIVE CORPECTOMY Cervical four - six fusion   APPLICATION OF WOUND VAC Right 10/22/2018   Procedure: Application Of Wound Vac;  Surgeon: Nadara Mustard, MD;  Location: [facility] OR;  Service: Orthopedics;  Laterality: Right;   CYSTOSCOPY WITH BIOPSY N/A 05/01/2022   Procedure: CYSTOSCOPY WITH URETHRAL BIOPSY;  Surgeon: Noel Christmas, MD;  Location: [facility] ORS;  Service: Urology;  Laterality: N/A;  1 HR    CYSTOSCOPY WITH RETROGRADE URETHROGRAM N/A 04/25/2018   Procedure: CYSTOSCOPY WITH RETROGRADE URETHROGRAM/ BALLOON DILATION;  Surgeon: Ihor Gully, MD;  Location: [facility] ORS;  Service: Urology;  Laterality: N/A;   CYSTOSCOPY WITH URETHRAL DILATATION N/A 05/01/2022   Procedure: BALLOON DILATION WITH  OPTILUME;  Surgeon: Noel Christmas, MD;  Location: [facility] ORS;  Service: Urology;  Laterality: N/A;   LAPAROTOMY N/A 12/03/2022   Procedure: EXPLORATORY LAPAROTOMY  total abdominal colectomy with end ileostomy;  Surgeon: Kinsinger, De Blanch, MD;  Location: [facility] OR;  Service: General;  Laterality: N/A;   MULTIPLE TOOTH EXTRACTIONS     POSTERIOR CERVICAL FUSION/FORAMINOTOMY N/A 11/29/2015   Procedure: Cervical Three-Cervical Seven Posterior Cervical Laminectomy with Fusion;  Surgeon: Lisbeth Renshaw, MD;  Location: [facility];  Service: Neurosurgery;  Laterality: N/A;  Cervical Three-Cervical Seven Posterior Cervical Laminectomy with Fusion   STUMP REVISION Right 10/22/2018   Procedure: REVISION RIGHT BELOW KNEE AMPUTATION;  Surgeon: Nadara Mustard, MD;  Location: [facility] OR;  Service: Orthopedics;  Laterality: Right;   STUMP REVISION Right 12/05/2018   Procedure: Revision Right Above Knee Amputation;  Surgeon: Nadara Mustard, MD;  Location: [facility] OR;  Service: Orthopedics;  Laterality: Right;   STUMP REVISION Right 05/29/2019   Procedure: REVISION RIGHT ABOVE KNEE AMPUTATION;  Surgeon: Nadara Mustard, MD;  Location: [facility] OR;  Service: Orthopedics;  Laterality: Right;   STUMP REVISION Right 06/24/2019   Procedure: STUMP REVISION;  Surgeon: Nadara Mustard, MD;  Location: [facility] OR;  Service: Orthopedics;  Laterality: Right;     Medications:   Current Facility-Administered Medications:    alum & mag hydroxide-simeth (MAALOX/MYLANTA) 200-200-20 MG/5ML suspension 30 mL, 30 mL, Oral, Q4H PRN, Noralee Stain, DO, 30 mL at 08/07/23 1544   aspirin EC tablet 81 mg, 81 mg, Oral, Daily, Hunsucker, Lesia Sago, MD, 81 mg at 08/11/23  0902   atorvastatin (LIPITOR) tablet 80 mg, 80 mg, Oral, QHS, Cristopher Peru, [name], 80 mg at 08/10/23 2253   carvedilol (COREG) tablet 6.25 mg, 6.25 mg, Oral, BID WC, Noralee Stain, DO, 6.25 mg at 08/11/23 0901   Chlorhexidine Gluconate Cloth 2 % PADS 6 each, 6 each, Topical, Daily, Lorin Glass, MD, 6 each at 08/11/23 1218   diclofenac Sodium (VOLTAREN) 1 % topical gel 2 g, 2 g, Topical, QID PRN, Joneen Roach, Debby, MD, 2 g at 08/11/23 1122   escitalopram (LEXAPRO) tablet 20 mg, 20 mg, Oral, Daily, Autry, Lauren E, [name], 20 mg at 08/11/23 0902   famotidine (PEPCID) tablet 20 mg, 20 mg, Oral, Daily, Lidia Collum, [name], 20 mg at 08/11/23 0902   heparin injection 5,000 Units, 5,000 Units, Subcutaneous, Q8H, Lidia Collum, [name], 5,000 Units at 08/11/23 1452   hydrALAZINE (APRESOLINE) injection 10-40 mg, 10-40 mg, Intravenous, Q4H PRN, Pia Mau D, [name]   hydrALAZINE (APRESOLINE) tablet 10 mg, 10 mg, Oral, Q8H, Autry, Lauren E, [name], 10 mg at 08/11/23 1452   HYDROmorphone (DILAUDID) tablet 3 mg, 3 mg, Oral, Q6H, Marolyn Haller, MD, 3 mg at 08/11/23 1121   ipratropium-albuterol (DUONEB) 0.5-2.5 (3) MG/3ML nebulizer solution 3 mL, 3 mL, Nebulization, Q4H PRN, Pia Mau D, [name], 3 mL at 08/05/23 0903   lactulose (CHRONULAC) 10 GM/15ML solution 10 g, 10 g, Oral, BID, Alvino Chapel,  Victorino Dike, DO, 10 g at 08/11/23 0901   magnesium oxide (MAG-OX) tablet 400 mg, 400 mg, Oral, Daily, Karie Fetch, MD, 400 mg at 08/11/23 1610   multivitamin with minerals tablet 1 tablet, 1 tablet, Oral, Daily, Deanna Artis, DO, 1 tablet at 08/11/23 0901   OLANZapine zydis (ZYPREXA) disintegrating tablet 5 mg, 5 mg, Oral, Daily PRN, 5 mg at 08/10/23 2252 **OR** OLANZapine (ZYPREXA) injection 5 mg, 5 mg, Intramuscular, Daily PRN, Karie Fetch, MD, 5 mg at 08/09/23 0431   OLANZapine (ZYPREXA) tablet 10 mg, 10 mg, Oral, Daily, Karie Fetch, MD, 10 mg at 08/11/23 0902   OLANZapine zydis (ZYPREXA) disintegrating tablet  15 mg, 15 mg, Oral, QHS, Karie Fetch, MD, 15 mg at 08/10/23 2042   ondansetron (ZOFRAN-ODT) disintegrating tablet 4 mg, 4 mg, Oral, Q8H PRN, Marcelino Duster, MD, 4 mg at 08/11/23 1121   Oral care mouth rinse, 15 mL, Mouth Rinse, PRN, Kalman Shan, MD   OXcarbazepine (TRILEPTAL) tablet 150 mg, 150 mg, Oral, BID, Karie Fetch, MD, 150 mg at 08/11/23 9604   pantoprazole (PROTONIX) EC tablet 40 mg, 40 mg, Oral, Daily, Autry, Lauren E, [name], 40 mg at 08/11/23 0902   polyethylene glycol (MIRALAX / GLYCOLAX) packet 17 g, 17 g, Oral, Daily PRN, Suzie Portela, John D, [name]   pregabalin (LYRICA) capsule 50 mg, 50 mg, Oral, BID, Marolyn Haller, MD, 50 mg at 08/11/23 0901   senna (SENOKOT) tablet 8.6 mg, 1 tablet, Oral, Daily, Marolyn Haller, MD, 8.6 mg at 08/11/23 0901   tamsulosin [facility]) capsule 0.4 mg, 0.4 mg, Oral, Daily, Calkins, Derek W, DO, 0.4 mg at 08/11/23 0901   [COMPLETED] thiamine (VITAMIN B1) 500 mg in sodium chloride 0.9 % 50 mL IVPB, 500 mg, Intravenous, Q8H, Last Rate: 110 mL/hr at 07/21/23 0600, Infusion Verify at 07/21/23 0600 **FOLLOWED BY** thiamine (VITAMIN B1) tablet 100 mg, 100 mg, Oral, Daily, Lorin Glass, MD, 100 mg at 08/11/23 0900   traMADol (ULTRAM) tablet 50 mg, 50 mg, Oral, Q6H PRN, Noralee Stain, DO, 50 mg at 08/11/23 0901  Allergies: Allergies  Allergen Reactions   Latex Other (See Comments)    Unknown reaction    Fredonia Highland, MD, PGY-2

## 2023-08-11 NOTE — Plan of Care (Signed)
  Problem: Education: Goal: Knowledge of General Education information will improve Description: Including pain rating scale, medication(s)/side effects and non-pharmacologic comfort measures Outcome: Progressing   Problem: Clinical Measurements: Goal: Respiratory complications will improve Outcome: Progressing   Problem: Nutrition: Goal: Adequate nutrition will be maintained Outcome: Progressing   Problem: Elimination: Goal: Will not experience complications related to urinary retention Outcome: Progressing   Problem: Safety: Goal: Ability to remain free from injury will improve Outcome: Progressing   Problem: Skin Integrity: Goal: Risk for impaired skin integrity will decrease Outcome: Progressing

## 2023-08-11 NOTE — Progress Notes (Signed)
   Palliative Medicine Inpatient Follow Up Note HPI:  63 y.o. 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 He was admitted to ICU and managed with Precedex.    He was hospitalized 06/27/2023 to 07/11/2023 for septic shock due to ESBL UTI and multifocal pneumonia with rhinovirus infection/acute respiratory failure with hypoxia requiring intubation and extubation/acute encephalopathy/acute ischemic stroke. Palliative care saw during the January 2025 hospitalization. The Palliative team has been asked to get involved on this admission to further establish goals of care.   Today's Discussion 08/11/2023  *Please note that this is a verbal dictation therefore any spelling or grammatical errors are due to the "Dragon [employer] One" system interpretation.  Chart reviewed inclusive of vital signs, progress notes, laboratory results, and diagnostic images.   I spoke with patients RN, Malka this morning. We reviewed that Burech remains agitated and has continued to require restraints on his wrists incrementally.  I met with Kashden at bedside this morning. He is more clear minded though frustrated that he has the restraints on. I shared that at this time he has not been safe and staff is worried he may hurt himself or someone else. He perseverated on taking his restraints off during my time at bedside thereafter.   Plan for [facility] once medically optimized from a behavioral perspective.   Questions and concerns addressed/Palliative Support Provided.   Objective Assessment: Vital Signs Vitals:   08/10/23 2030 08/11/23 0901  BP: (!) 144/68 128/75  Pulse: 66 65  Resp: 18 18  Temp: 99.3 F (37.4 C) 98.9 F (37.2 C)  SpO2: 100% 99%    Intake/Output  Summary (Last 24 hours) at 08/11/2023 1211 Last data filed at 08/11/2023 0623 Gross per 24 hour  Intake 916 ml  Output 1650 ml  Net -734 ml   Last Weight  Most recent update: 08/11/2023  7:00 AM    Weight  78 kg (171 lb 15.3 oz)            Gen:  Older Caucasian M chronically ill appearing HEENT:  moist mucous membranes CV: Regular rate and rhythm  PULM:  On RA, breathing is even and nonlabored ABD: soft/nontender  EXT: RLE AKA Neuro: Awake and alert to self and aware of being in the hospital   SUMMARY OF RECOMMENDATIONS   Full Code / Full scope care Appreciate the Psychiatry's ongoing management of patients complicated delirium Needs to be free of restraints for 48 hours Plan to transition back to [facility] once medically optimized  Billing based on MDM: Low  The PMT will sign off at this time per secure chat with Dr. Alvino Chapel. Please re-consult if needed.  ______________________________________________________________________________________ Lamarr Lulas [place] Palliative Medicine Team Team Cell Phone: [phone] Please utilize secure chat with additional questions, if there is no response within 30 minutes please call the above phone number  Palliative Medicine Team providers are available by phone from 7am to 7pm daily and can be reached through the team cell phone.  Should this patient require assistance outside of these hours, please call the patient's attending physician.

## 2023-08-11 NOTE — Plan of Care (Signed)

## 2023-08-11 NOTE — Progress Notes (Signed)
 PROGRESS NOTE    BRENDA COWHER  YNW:[id number] DOB: [birth year] DOA: 07/15/2023 PCP: [name], Provider, MD     Brief Narrative:  Gregory [name] Marple is a 63 year old male with history of diabetes mellitus type 2, hypertension, hyperlipidemia, chronic kidney disease stage IIIb, COPD, chronic osteomyelitis status post right AKA, left midfoot amputation, unspecified stroke, A-fib, chronic diastolic heart failure, mesenteric ischemia status post colostomy and recent hospitalization from 06/27/2023 to 07/11/2023 for septic shock due to ESBL UTI and multifocal pneumonia with rhinovirus infection/ acute respiratory failure with hypoxia requiring intubation and extubation/ acute encephalopathy/ acute ischemic stroke and subsequent discharge to SNF presented with severe agitation and delirium.    He was admitted to ICU and managed with Precedex.  He was transferred to [facility] service from 07/23/2023 onwards. Palliative care and psychiatry following. Currently pending SNF placement once mental status better.   He is intermittently agitates, delirious requiring restraints, haldol. Psych team following him and making medication changes.  New events last 24 hours / Subjective: Alert and calm this morning. Asking for pain management for his right leg stump. Asking if he can come off restraints "if I behave"    Assessment & Plan:   Principal Problem:   Acute encephalopathy Active Problems:   Malnutrition of moderate degree   Persistent hyperactive delirium due to multiple etiologies   Agitation   Persistent encephalopathy Hyperactive delirium At baseline when discharged 07/11/2023: Patient was lucid, able to feed, and bathe himself. He lives at [facility]. His daughter makes decisions for his care. Initially admitted to ICU, required Precedex. Ammonia, VBG with no abnormalities to explain patient's presentation. CT of the head was negative for acute intracranial abnormality. Currently no obvious medical reasons to  explain delirium.     Ammonia is elevated despite discontinuing Depakote.  Lactulose started. Wrist restraints in place. Can trial off later today.   Possible Urinary Tract Infection Finished 5 days of ceftriaxone.    COPD not in acute exacerbation  Stable. Continue current nebulized regimen   HFpEF  Hypertension Hyperlipidemia Hypotension Resolved  BP remains stable Continue Coreg, hydralazine and statin.     Diabetes mellitus type 2 Hypoglycemia resolved Well controlled. Has had some low blood sugars in the setting of poor oral intake.  CBG checks per hypoglycemia protocol.   Acute kidney injury CKD stage IIIb Kidney function at baseline. Avoid nephrotoxic medicaitons.    Chronic pain syndrome Continue current regimen. Continue dilaudid, added tramadol    Depression/anxiety Continue current regimen per psychiatry     Anemia of chronic disease Possibly from chronic kidney disease. No iron deficiency. Vit B12 WNL.  Hgb stable    Thrombocytopenia  Resolved    Goals of care Palliative care and medical team had discussion with patient's daughter. She would like patient to remain full code at this time as this was his wishes prior to his confusion.   Daughter wishes to get a Engineer, site for patient once he goes back to [facility].    Generalized deconditioning PT recommending SNF placement.  Back to [facility] if mental status better. TOC aware.    DVT prophylaxis:  heparin injection 5,000 Units Start: 07/16/23 0600  Code Status: Full Family Communication: None at bedside Disposition Plan: SNF Status is: Inpatient Remains inpatient appropriate because: delirium.  He needs to be out of his mittens and telemetry sitter/sitter discontinued for 48 hours prior to discharge.    Antimicrobials:  Anti-infectives (From admission, onward)    Start  Dose/Rate Route Frequency Ordered Stop   07/26/23 1530  sulfamethoxazole-trimethoprim (BACTRIM DS) 800-160 MG  per tablet 1 tablet  Status:  Discontinued        1 tablet Oral Every 12 hours 07/26/23 1439 07/26/23 1439   07/26/23 1500  cefTRIAXone (ROCEPHIN) 1 g in sodium chloride 0.9 % 100 mL IVPB        1 g 200 mL/hr over 30 Minutes Intravenous Every 24 hours 07/26/23 1440 07/31/23 1459   07/16/23 2200  vancomycin (VANCOREADY) IVPB 750 mg/150 mL  Status:  Discontinued        750 mg 150 mL/hr over 60 Minutes Intravenous Every 12 hours 07/16/23 0739 07/16/23 1243   07/16/23 2200  vancomycin (VANCOCIN) 750 mg in sodium chloride 0.9 % 250 mL IVPB  Status:  Discontinued       Note to Pharmacy: Indication: Sepsis   750 mg 265 mL/hr over 60 Minutes Intravenous Every 12 hours 07/16/23 1243 07/17/23 0821   07/16/23 0800  vancomycin (VANCOREADY) IVPB 1750 mg/350 mL        1,750 mg 175 mL/hr over 120 Minutes Intravenous  Once 07/16/23 0705 07/16/23 1137   07/16/23 0800  piperacillin-tazobactam (ZOSYN) IVPB 3.375 g  Status:  Discontinued        3.375 g 12.5 mL/hr over 240 Minutes Intravenous Every 8 hours 07/16/23 0705 07/17/23 0821        Objective: Vitals:   08/10/23 1621 08/10/23 2030 08/11/23 0500 08/11/23 0901  BP:  (!) 144/68  128/75  Pulse: (!) 58 66  65  Resp:  18  18  Temp:  99.3 F (37.4 C)  98.9 F (37.2 C)  TempSrc:  Oral    SpO2:  100%  99%  Weight:   78 kg   Height:        Intake/Output Summary (Last 24 hours) at 08/11/2023 1054 Last data filed at 08/11/2023 0711 Gross per 24 hour  Intake 716 ml  Output 1650 ml  Net -934 ml   Filed Weights   08/07/23 0423 08/10/23 0436 08/11/23 0500  Weight: 80.1 kg 79.7 kg 78 kg   Examination: General exam: Appears calm and interactive this morning   Data Reviewed: I have personally reviewed following labs and imaging studies  CBC: Recent Labs  Lab 08/09/23 0937  WBC 11.2*  HGB 9.8*  HCT 29.3*  MCV 90.4  PLT 207   Basic Metabolic Panel: Recent Labs  Lab 08/09/23 0937  NA 131*  K 4.8  CL 100  CO2 19*  GLUCOSE 114*  BUN  27*  CREATININE 1.66*  CALCIUM 8.9   GFR: Estimated Creatinine Clearance: 49.3 mL/min (A) (by C-G formula based on SCr of 1.66 mg/dL (H)). Liver Function Tests: Recent Labs  Lab 08/09/23 0937  AST 14*  ALT 14  ALKPHOS 70  BILITOT 0.4  PROT 6.3*  ALBUMIN 2.9*   No results for input(s): "LIPASE", "AMYLASE" in the last 168 hours. Recent Labs  Lab 08/09/23 0937  AMMONIA 42*   Coagulation Profile: No results for input(s): "INR", "PROTIME" in the last 168 hours. Cardiac Enzymes: No results for input(s): "CKTOTAL", "CKMB", "CKMBINDEX", "TROPONINI" in the last 168 hours. BNP (last 3 results) No results for input(s): "PROBNP" in the last 8760 hours. HbA1C: No results for input(s): "HGBA1C" in the last 72 hours. CBG: Recent Labs  Lab 08/10/23 1138 08/10/23 1535 08/10/23 2032 08/11/23 0026 08/11/23 0859  GLUCAP 158* 164* 155* 185* 138*   Lipid Profile: No results for input(s): "CHOL", "  HDL", "LDLCALC", "TRIG", "CHOLHDL", "LDLDIRECT" in the last 72 hours. Thyroid Function Tests: No results for input(s): "TSH", "T4TOTAL", "FREET4", "T3FREE", "THYROIDAB" in the last 72 hours. Anemia Panel: No results for input(s): "VITAMINB12", "FOLATE", "FERRITIN", "TIBC", "IRON", "RETICCTPCT" in the last 72 hours. Sepsis Labs: No results for input(s): "PROCALCITON", "LATICACIDVEN" in the last 168 hours.  No results found for this or any previous visit (from the past 240 hours).    Radiology Studies: No results found.    Scheduled Meds:  aspirin EC  81 mg Oral Daily   atorvastatin  80 mg Oral QHS   carvedilol  6.25 mg Oral BID WC   Chlorhexidine Gluconate Cloth  6 each Topical Daily   escitalopram  20 mg Oral Daily   famotidine  20 mg Oral Daily   heparin  5,000 Units Subcutaneous Q8H   hydrALAZINE  10 mg Oral Q8H   HYDROmorphone  3 mg Oral Q6H   lactulose  10 g Oral BID   magnesium oxide  400 mg Oral Daily   multivitamin with minerals  1 tablet Oral Daily   OLANZapine  10 mg  Oral Daily   OLANZapine zydis  15 mg Oral QHS   OXcarbazepine  150 mg Oral BID   pantoprazole  40 mg Oral Daily   pregabalin  50 mg Oral BID   senna  1 tablet Oral Daily   tamsulosin  0.4 mg Oral Daily   thiamine  100 mg Oral Daily   Continuous Infusions:   LOS: 26 days   Time spent: 25 minutes   Noralee Stain, DO [facility] 08/11/2023, 10:54 AM   Available via Epic secure chat 7am-7pm After these hours, please refer to coverage provider listed on [email or web address]

## 2023-08-12 LAB — GLUCOSE, CAPILLARY
Glucose-Capillary: 106 mg/dL — ABNORMAL HIGH (ref 70–99)
Glucose-Capillary: 119 mg/dL — ABNORMAL HIGH (ref 70–99)
Glucose-Capillary: 138 mg/dL — ABNORMAL HIGH (ref 70–99)
Glucose-Capillary: 159 mg/dL — ABNORMAL HIGH (ref 70–99)
Glucose-Capillary: 170 mg/dL — ABNORMAL HIGH (ref 70–99)
Glucose-Capillary: 196 mg/dL — ABNORMAL HIGH (ref 70–99)

## 2023-08-12 MED ORDER — LORAZEPAM 2 MG/ML IJ SOLN
1.0000 mg | Freq: Once | INTRAMUSCULAR | Status: AC
  Administered 2023-08-12: 1 mg via INTRAMUSCULAR
  Filled 2023-08-12: qty 1

## 2023-08-12 MED ORDER — LACTULOSE 10 GM/15ML PO SOLN
20.0000 g | Freq: Two times a day (BID) | ORAL | Status: DC
  Administered 2023-08-12 – 2023-08-17 (×9): 20 g via ORAL
  Filled 2023-08-12 (×11): qty 30

## 2023-08-12 NOTE — Progress Notes (Addendum)
 PROGRESS NOTE    Gregory [name]  ZOX:[id number] DOB: 1960-11-10 DOA: 07/15/2023 PCP: [name], Provider, MD     Brief Narrative:  Gregory [name] is a 63 year old male with history of diabetes mellitus type 2, hypertension, hyperlipidemia, chronic kidney disease stage IIIb, COPD, chronic osteomyelitis status post right AKA, left midfoot amputation, unspecified stroke, A-fib, chronic diastolic heart failure, mesenteric ischemia status post colostomy and recent hospitalization from 06/27/2023 to 07/11/2023 for septic shock due to ESBL UTI and multifocal pneumonia with rhinovirus infection/ acute respiratory failure with hypoxia requiring intubation and extubation/ acute encephalopathy/ acute ischemic stroke and subsequent discharge to SNF presented with severe agitation and delirium.    He was admitted to ICU and managed with Precedex.  He was transferred to [facility] service from 07/23/2023 onwards. Palliative care and psychiatry following. Currently pending SNF placement once mental status better.   He is intermittently agitates, delirious requiring restraints, haldol. Psych team following him and making medication changes.  New events last 24 hours / Subjective: Sleeping soundly this morning.  Per nursing, restraints were removed last night without any issues overnight.  Assessment & Plan:   Principal Problem:   Acute encephalopathy Active Problems:   Malnutrition of moderate degree   Persistent hyperactive delirium due to multiple etiologies   Agitation   Persistent encephalopathy Hyperactive delirium At baseline when discharged 07/11/2023: Patient was lucid, able to feed, and bathe himself. He lives at [facility]. His daughter makes decisions for his care. Initially admitted to ICU, required Precedex. Ammonia, VBG with no abnormalities to explain patient's presentation. CT of the head was negative for acute intracranial abnormality. Currently no obvious medical reasons to explain delirium.      Ammonia is elevated despite discontinuing Depakote.  Lactulose started, dose increased today.  Restraints off since 2/23 PM.  Psychiatry signed off 2/24.  Possible Urinary Tract Infection Finished 5 days of ceftriaxone.    COPD not in acute exacerbation  Stable. Continue current nebulized regimen   HFpEF  Hypertension Hyperlipidemia Hypotension Resolved  BP remains stable Continue Coreg, hydralazine and statin.     Diabetes mellitus type 2 Hypoglycemia resolved Well controlled. Has had some low blood sugars in the setting of poor oral intake.  CBG checks per hypoglycemia protocol.   Acute kidney injury CKD stage IIIb Kidney function at baseline. Avoid nephrotoxic medicaitons.    Chronic pain syndrome Continue current regimen. Continue dilaudid, added tramadol    Depression/anxiety Continue current regimen per psychiatry     Anemia of chronic disease Possibly from chronic kidney disease. No iron deficiency. Vit B12 WNL.  Hgb stable    Thrombocytopenia  Resolved    Goals of care Palliative care and medical team had discussion with patient's daughter. She would like patient to remain full code at this time as this was his wishes prior to his confusion.   Daughter wishes to get a Engineer, site for patient once he goes back to [facility].    Generalized deconditioning PT recommending SNF placement.  Back to [facility] if mental status better. TOC aware.    DVT prophylaxis:  heparin injection 5,000 Units Start: 07/16/23 0600  Code Status: Full Family Communication: None at bedside Disposition Plan: SNF Status is: Inpatient Remains inpatient appropriate because: delirium.  He needs to be out of his mittens and telemetry sitter/sitter discontinued for 48 hours prior to discharge.    Antimicrobials:  Anti-infectives (From admission, onward)    Start     Dose/Rate Route Frequency Ordered Stop  07/26/23 1530  sulfamethoxazole-trimethoprim (BACTRIM DS)  800-160 MG per tablet 1 tablet  Status:  Discontinued        1 tablet Oral Every 12 hours 07/26/23 1439 07/26/23 1439   07/26/23 1500  cefTRIAXone (ROCEPHIN) 1 g in sodium chloride 0.9 % 100 mL IVPB        1 g 200 mL/hr over 30 Minutes Intravenous Every 24 hours 07/26/23 1440 07/31/23 1459   07/16/23 2200  vancomycin (VANCOREADY) IVPB 750 mg/150 mL  Status:  Discontinued        750 mg 150 mL/hr over 60 Minutes Intravenous Every 12 hours 07/16/23 0739 07/16/23 1243   07/16/23 2200  vancomycin (VANCOCIN) 750 mg in sodium chloride 0.9 % 250 mL IVPB  Status:  Discontinued       Note to Pharmacy: Indication: Sepsis   750 mg 265 mL/hr over 60 Minutes Intravenous Every 12 hours 07/16/23 1243 07/17/23 0821   07/16/23 0800  vancomycin (VANCOREADY) IVPB 1750 mg/350 mL        1,750 mg 175 mL/hr over 120 Minutes Intravenous  Once 07/16/23 0705 07/16/23 1137   07/16/23 0800  piperacillin-tazobactam (ZOSYN) IVPB 3.375 g  Status:  Discontinued        3.375 g 12.5 mL/hr over 240 Minutes Intravenous Every 8 hours 07/16/23 0705 07/17/23 0821        Objective: Vitals:   08/11/23 2001 08/12/23 0431 08/12/23 0500 08/12/23 0819  BP: (!) 149/67 112/88  (!) 103/51  Pulse: (!) 57   (!) 51  Resp: 18 18  17   Temp: 98.2 F (36.8 C) 97.9 F (36.6 C)  97.9 F (36.6 C)  TempSrc: Oral   Oral  SpO2: 99% 99%  96%  Weight:   76.6 kg   Height:        Intake/Output Summary (Last 24 hours) at 08/12/2023 1138 Last data filed at 08/12/2023 0500 Gross per 24 hour  Intake 1006 ml  Output 300 ml  Net 706 ml   Filed Weights   08/10/23 0436 08/11/23 0500 08/12/23 0500  Weight: 79.7 kg 78 kg 76.6 kg   Examination: General exam: Appears calm and comfortable, sleeping soundly   Data Reviewed: I have personally reviewed following labs and imaging studies  CBC: Recent Labs  Lab 08/09/23 0937  WBC 11.2*  HGB 9.8*  HCT 29.3*  MCV 90.4  PLT 207   Basic Metabolic Panel: Recent Labs  Lab 08/09/23 0937   NA 131*  K 4.8  CL 100  CO2 19*  GLUCOSE 114*  BUN 27*  CREATININE 1.66*  CALCIUM 8.9   GFR: Estimated Creatinine Clearance: 48.7 mL/min (A) (by C-G formula based on SCr of 1.66 mg/dL (H)). Liver Function Tests: Recent Labs  Lab 08/09/23 0937  AST 14*  ALT 14  ALKPHOS 70  BILITOT 0.4  PROT 6.3*  ALBUMIN 2.9*   No results for input(s): "LIPASE", "AMYLASE" in the last 168 hours. Recent Labs  Lab 08/09/23 0937  AMMONIA 42*   Coagulation Profile: No results for input(s): "INR", "PROTIME" in the last 168 hours. Cardiac Enzymes: No results for input(s): "CKTOTAL", "CKMB", "CKMBINDEX", "TROPONINI" in the last 168 hours. BNP (last 3 results) No results for input(s): "PROBNP" in the last 8760 hours. HbA1C: No results for input(s): "HGBA1C" in the last 72 hours. CBG: Recent Labs  Lab 08/11/23 1155 08/11/23 2003 08/12/23 0005 08/12/23 0433 08/12/23 0809  GLUCAP 168* 145* 170* 138* 119*   Lipid Profile: No results for input(s): "CHOL", "HDL", "LDLCALC", "  TRIG", "CHOLHDL", "LDLDIRECT" in the last 72 hours. Thyroid Function Tests: No results for input(s): "TSH", "T4TOTAL", "FREET4", "T3FREE", "THYROIDAB" in the last 72 hours. Anemia Panel: No results for input(s): "VITAMINB12", "FOLATE", "FERRITIN", "TIBC", "IRON", "RETICCTPCT" in the last 72 hours. Sepsis Labs: No results for input(s): "PROCALCITON", "LATICACIDVEN" in the last 168 hours.  No results found for this or any previous visit (from the past 240 hours).    Radiology Studies: No results found.    Scheduled Meds:  aspirin EC  81 mg Oral Daily   atorvastatin  80 mg Oral QHS   carvedilol  6.25 mg Oral BID WC   Chlorhexidine Gluconate Cloth  6 each Topical Daily   escitalopram  20 mg Oral Daily   famotidine  20 mg Oral Daily   heparin  5,000 Units Subcutaneous Q8H   hydrALAZINE  10 mg Oral Q8H   HYDROmorphone  3 mg Oral Q6H   lactulose  20 g Oral BID   magnesium oxide  400 mg Oral Daily    multivitamin with minerals  1 tablet Oral Daily   OLANZapine  10 mg Oral Daily   OLANZapine zydis  15 mg Oral QHS   OXcarbazepine  150 mg Oral BID   pantoprazole  40 mg Oral Daily   pregabalin  50 mg Oral BID   senna  1 tablet Oral Daily   tamsulosin  0.4 mg Oral Daily   thiamine  100 mg Oral Daily   Continuous Infusions:   LOS: 27 days   Time spent: 25 minutes   Noralee Stain, DO [facility] 08/12/2023, 11:38 AM   Available via Epic secure chat 7am-7pm After these hours, please refer to coverage provider listed on [email or web address]

## 2023-08-12 NOTE — Consult Note (Signed)
 [name]  Patient Name: [name]  MRN: [id number]  DOB: 1961-04-07  Consult Order details: [place]/severe agitation.   Mode of Visit: In person   Psychiatry Consult Evaluation  Service Date: August 12, 2023 LOS:  LOS: 27 days  Chief Complaint "I'm in pain everywhere"  Primary Psychiatric Diagnoses  Hyperactive delirium  2.  Somatic symptom disorder with predominant pain 3. History of MDD   Assessment  Gregory [name] is a 62 y.o. male admitted: Medicallyfor 07/15/2023  3:05 PM for acute agitated encephalopathy. 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   His current presentation of inattention, waxing and waning mental status, change over the last couple of days is most consistent with hyperactive delirium likely in the setting of prior acute on chronic R PCA infarct, multiple medications, uncontrolled pain. There is suspicion for somatic symptom disorder with predominant pain given chart review notable for multiple outpatient visits to chronic pain physicians and continued use of oxy throughout the years though notably nothing on PDMP. Main psychiatric diagnosis over the years has been depression/anxiety and patient and collateral denies any past psychiatric hospitalizations or suicide attempts. He reports depression but is unable to state what medications he has been on and whether or not they have helped. Current outpatient psychotropic medications from last admission included scheduled lexapro 20, oxy 15 TID, lyrica 25 TID, 100 BID, seroquel 100 at bedtime, PRN haldol 5 Q6H, oxy 5 Q6H and historically he was fully alert and mostly oriented on these medications. He was likely compliant with medications prior to admission since he was at the nursing  home. On initial examination, patient is alert, irritable, is not able to participate in sustained attention testing and is reporting pain. Will recommend decreasing seroquel, stopping klonopin, continuing depakote, making sure his pain is well controlled. I suspect that he has a low frustration tolerance with regards to his pain which contributes to agitation - was put on several anticholinergic or otherwise deliriogenic meds when in hyperactive delirium - finding right balance has been major challenge of this admission. Globally alertness and participation in PT has improved w/ reduction of medications with setback w/ recent UTI. Patient continued to wax and wane between alertness and somnolence. When alert, he would interfere with medical care requiring restraints. Given ineffectiveness of seroquel and haldol, he was started on zyprexa. Discussed with daughter. With increase in repeat ammonia and thrombocytopenia, depakote was ultimately tapered and discontinued. He received zyprexa but per nurse was still agitated and did not sleep. On 2/14, patient appeared somnolent after getting total zyprexa 20mg  overnight. He was also started on klonopin at bedtime to assist with agitation and sleep and appeared improved. He was briefly out of restraints but then required restraints in the evening after attempting to get out of bed again. Patient's mental status continues to wax and wane. Patient unable to consent to medication however discussed with daughter starting trileptal to help with mood stabilization. She was in agreement.   Patient continues to be disoriented and intermittently agitated. He is oriented to self, year, somewhat to situation. We increased his scheduled zyprexa in the AM (2/21) since he is already getting increased amount of zyprexa during the day to attempt to treat his agitation and harm to self/others. Thankfully, repeat  EKG Qtc was wnl. Na slightly decreased at 131. Unfortunately appears his  sleep wake cycle is now switched since he is more drowsy during the day but more agitated at night. On follow-up examination, patient continues to be drowsy during the day. No EPS on exam today. He has improved in that he is currently off restraints and had no events overnight. This patient has minimal psych history and our medication regimen was mainly to treat his agitation associated with delirium. At this time will recommend continuing current medication regimen and will sign off.   Diagnoses:  Active Hospital problems: Principal Problem:   Acute encephalopathy Active Problems:   Malnutrition of moderate degree   Persistent hyperactive delirium due to multiple etiologies   Agitation    Plan  ## Psychiatric Medication Recommendations:  --Continue Zyprexa 15 mg at bedtime for insomnia, mood stabilization, hallucinations --Continue zyprexa 10mg  daily for agitation, mood stabilization, harm to self or others --continue lexapro 20 daily for depression  --Continue oxcarbazepine 150mg  BID for mood stabilization, irritability, agitation  2/21 Na 131   --optimize pain regimen  -- Continue decreased Zyprexa 5mg  PO daily PRN for severe agitation, would not give PRN ativan within 2 hours of administration due to risk of respiratory depression  --stop depakote 500 ER at bedtime for mood stabilization-due to hyperammonemia and thrombocytopenia --stop seroquel 100 QHS for mood stabilization and insomnia given ineffectiveness, concern for polypharmacy --stop melatonin 3mg  at bedtime for insomnia to reduce polypharmacy --stop klonopin 1 mg at bedtime to decrease deliriogenic agents   ## Medical Decision Making Capacity: Not specifically addressed in this encounter  ## Further Work-up:  - VPA level 41 (albumin low 2.5 most recently) - will hold on further increases given clinical effect d/t low albumin.  --repeat ammonia level 2/21 42 -- most recent EKG on 2/13 had Qtc of 422 -- Pertinent labwork  reviewed earlier this admission includes: CMP (Cr 1.52), CBC, TSH, UDS  ## Disposition:-- There are no psychiatric contraindications to discharge at this time  ## Behavioral / Environmental: -Delirium Precautions: Delirium Interventions for Nursing and Staff: - RN to open blinds every AM. - To Bedside: Glasses, hearing aide, and pt's own shoes. Make available to patients. when possible and encourage use. - Encourage po fluids when appropriate, keep fluids within reach. - OOB to chair with meals. - Passive ROM exercises to all extremities with AM & PM care. - RN to assess orientation to person, time and place QAM and PRN. - Recommend extended visitation hours with familiar family/friends as feasible. - Staff to minimize disturbances at night. Turn off television when pt asleep or when not in use.   ## Safety and Observation Level:  - Based on my clinical evaluation, I estimate the patient to be at moderate risk of self harm in the current setting due to agitation. - At this time, we recommend safety observation. This decision is based on my review of the chart including patient's history and current presentation, interview of the patient, mental status examination, and consideration of suicide risk including evaluating suicidal ideation, plan, intent, suicidal or self-harm behaviors, risk factors, and protective factors. This judgment is based on our ability to directly address suicide risk, implement suicide prevention strategies, and develop a safety plan while the patient is in the clinical setting. Please contact our team if there is a concern that risk level has changed.  CSSR Risk Category:C-SSRS RISK CATEGORY: No Risk  Suicide Risk Assessment: Patient has following modifiable risk factors for  suicide: recklessness, which we are addressing by optimizing his medications Patient has following non-modifiable or demographic risk factors for suicide: male gender Patient has the following protective  factors against suicide: Supportive family, no history of suicide attempts, and no history of NSSIB  Thank you for this consult request. Recommendations have been communicated to the primary team.  We will sign off at this time.   Karie Fetch, MD, PGY-2                                                               History of Present Illness  Relevant Aspects of [facility] Course:  Admitted on 07/15/2023 for acute agitated encephalopathy.  -Required precedex  -Acute encephalopathy thought to be sepsis related ?AKI vs CKD  -Received 7 day course of CTX for possible UTI  ON events:  VSS. Did not require PRNs. Per nurse, patient did well after receiving zyprexa overnight. Denied issues.   Patient Report:  Patient is seen in his bed. He is unable to sustain attention for interview. Physical exam with no EPS noted on exam today.  Psych ROS:  Depression: reports always had depression Anxiety: reports anxiety  Mania (lifetime and current): denies  Psychosis: (lifetime and current): none  Collateral information:  Contacted daughter [name] 2/18 She reports patient has had increased agitation, reports he had increased agitation even towards her yesterday. She reports he had increased confusion, was talking about her uncle that died before she was born. Was asking for pain meds even though he just received pain meds. Discussed starting trileptal for mood stabilization, she reports she is aware of the medication and is open to starting this.   Called daughter [name] 2/12:  She reports patient is still not back to baseline, still appears confused. Reports he also mentioned seeing her son at the bedside but he is not there. He asks her if he's going crazy. Discussed changing seroquel to zyprexa to see if that will help with sleep. She reports he slept later even prior to all his medical issues. She also asks about thrombocytopenia and depakote.   Contacted daughter [name] at [phone] on  2/5 She reports when he was discharged from last hospitalization, he was confused but pleasant and calm. Was with him when he was admitted to hospital. States he is not himself. States his agitation beforehand is if they were taking a long time to get his medication. He was more confused and didn't understand situation. Called her this morning and was with him last night, called her at 9pm. He was asking if she was coming to pick him up. He seemed confused about where he was last night. He seems a little more confused. Prior to admission, he was falling on the floor, trying to pee everywhere. Was not realizing where he was. On Monday nursing home told her he kept on falling in the floor. He didn't call her as he usually does. Still occasionally staring off when she was with him in the hospital. Trying to feed himself but weak. Held his pain medication. She reports she felt like he was a zombie yesterday. Sometimes need xanax when anxious. Yesterday he was telling her to clean up ice cream on floor. She thought perhaps he was bipolar. He can flip a switch really  quickly, gets agitated easily. If he doesn't get what he wants he gets really mad, has always been like that. He's been a functional adult until his health declined. Struggles with anxiety and depression from his health decline. Reports only psychotropic she is aware that he used to take was lexapro before all of his medical conditions.   ROS  Unable to assess due to mental status   Psychiatric and Social History  Psychiatric History:  Information collected from patient, chart review  Prev Dx/Sx: MDD Current Psych Provider: none Home Meds (current): scheduled lexapro 20, oxy 15 TID, lyrica 25 TID, 100 BID, seroquel 100 at bedtime, PRN haldol 5 Q6H, oxy 5 Q6H Previous Med Trials: wellbutrin, cymbalta, gabapentin Therapy: unknown  Prior Psych Hospitalization: denies  Prior Self Harm: denies Prior Violence: denies  Family Psych History:  denies, reports sister struggles with substance use  Family Hx suicide: unknown  Social History:  Living Situation: Lives at [facility] for several years, no access to anything else  Access to weapons/lethal means: none   Substance History Denies substance use history, UDS+opiates, BZD. Patient required ativan, versed.  -In the past he used marijuana frequently, but then became dependent on pain meds   Exam Findings  Vital Signs:  Temp:  [97.7 F (36.5 C)-98.9 F (37.2 C)] 97.9 F (36.6 C) (02/24 0431) Pulse Rate:  [57-107] 57 (02/23 2001) Resp:  [18] 18 (02/24 0431) BP: (112-149)/(67-98) 112/88 (02/24 0431) SpO2:  [98 %-99 %] 99 % (02/24 0431) Weight:  [76.6 kg] 76.6 kg (02/24 0500) Blood pressure 112/88, pulse (!) 57, temperature 97.9 F (36.6 C), resp. rate 18, height 6' (1.829 m), weight 76.6 kg, SpO2 99%. Body mass index is 22.89 kg/m.  Physical Exam Constitutional:      Appearance: He is obese.  HENT:     Head: Normocephalic.  Pulmonary:     Effort: Pulmonary effort is normal.  Neurological:     Mental Status: He is lethargic.   No EPS on exam today.   Mental Status Exam:  Orientation:  unable to assess   Memory:    unable to assess   Concentration:  Concentration: Poor  Recall:  Poor  Attention  Poor  Eye Contact:  Poor  Speech:   unable to assess   Language:   unable to assess   Volume:   unable to assess   Mood:  unable to assess   Affect:   labile  Thought Process:  poverty of thought  Thought Content:    unable to assess   Suicidal Thoughts:  unable to assess   Homicidal Thoughts:  unable to assess   Judgement:  poor  Insight:  limited  Psychomotor Activity:  Decreased  Akathisia:  No  Fund of Knowledge:    Denied perceptual disturbances - likely intermittent VH Assets: some ability to communicate needs  Cognition:  Impaired  ADL's:  Impaired  AIMS (if indicated):        Other History   These have been pulled in through the EMR,  reviewed, and updated if appropriate.  Family History:  The patient's family history includes Bone cancer in his father; Cancer in his father, mother, paternal grandfather, and another family member; Diabetes in his mother and another family member; Heart disease in his mother; Prostate cancer in his father.  Medical History: 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)    Acquired absence of right leg below knee (HCC)  Acute osteomyelitis of calcaneum, right (HCC) 10/02/2016   Acute respiratory failure (HCC)    Amputation stump infection (HCC) 08/12/2018   Anemia    Basal cell carcinoma, eyelid    Cancer (HCC)    Candidiasis 08/12/2018   CHF (congestive heart failure) (HCC)    chronic diastolic    Chronic back pain    Chronic kidney disease    stage 3    Chronic multifocal osteomyelitis (HCC)    of ankle and foot    Chronic pain syndrome    COPD (chronic obstructive pulmonary disease) (HCC)    DDD (degenerative disc disease), lumbar    Depression    major depressive disorder    Diabetes mellitus without complication (HCC)    type 2    Diabetic ulcer of toe of left foot (HCC) 10/02/2016   Difficult intubation    Difficulty in walking, not elsewhere classified    Epidural abscess 10/02/2016   Foot drop, bilateral    Foot drop, right 12/27/2015   GERD (gastroesophageal reflux disease)    Herpesviral vesicular dermatitis    History of COVID-19 03/15/2022   Hyperlipidemia    Hyperlipidemia    Hypertension    Insomnia    Malignant melanoma of other parts of face (HCC)    Melanoma (HCC)    MRSA bacteremia    Muscle weakness (generalized)    Nasal congestion    Necrosis of toe (HCC) 07/08/2018   Neuromuscular dysfunction of bladder    Osteomyelitis (HCC)    Osteomyelitis (HCC)    Osteomyelitis (HCC)    right BKA   Other idiopathic peripheral autonomic neuropathy    Retention of urine, unspecified    Squamous cell carcinoma of skin     Stroke (HCC)    Unsteadiness on feet    Urinary retention    Urinary retention    Urinary tract infection    Wears dentures    Wears glasses     Surgical History: Past Surgical History:  Procedure Laterality Date   AMPUTATION Left 03/29/2017   Procedure: LEFT GREAT TOE AMPUTATION AT METATARSOPHALANGEAL JOINT;  Surgeon: Nadara Mustard, MD;  Location: [facility] OR;  Service: Orthopedics;  Laterality: Left;   AMPUTATION Right 03/29/2017   Procedure: RIGHT BELOW KNEE AMPUTATION;  Surgeon: Nadara Mustard, MD;  Location: [facility] OR;  Service: Orthopedics;  Laterality: Right;   AMPUTATION Left 06/06/2018   Procedure: LEFT 2ND TOE AMPUTATION;  Surgeon: Nadara Mustard, MD;  Location: [facility] OR;  Service: Orthopedics;  Laterality: Left;   AMPUTATION Right 11/19/2018   Procedure: AMPUTATION ABOVE KNEE;  Surgeon: Nadara Mustard, MD;  Location: [facility] OR;  Service: Orthopedics;  Laterality: Right;   ANTERIOR CERVICAL CORPECTOMY N/A 11/25/2015   Procedure: ANTERIOR CERVICAL FIVE CORPECTOMY Cervical four - six fusion;  Surgeon: Lisbeth Renshaw, MD;  Location: [facility];  Service: Neurosurgery;  Laterality: N/A;  ANTERIOR CERVICAL FIVE CORPECTOMY Cervical four - six fusion   APPLICATION OF WOUND VAC Right 10/22/2018   Procedure: Application Of Wound Vac;  Surgeon: Nadara Mustard, MD;  Location: [facility] OR;  Service: Orthopedics;  Laterality: Right;   CYSTOSCOPY WITH BIOPSY N/A 05/01/2022   Procedure: CYSTOSCOPY WITH URETHRAL BIOPSY;  Surgeon: Noel Christmas, MD;  Location: [facility] ORS;  Service: Urology;  Laterality: N/A;  1 HR   CYSTOSCOPY WITH RETROGRADE URETHROGRAM N/A 04/25/2018   Procedure: CYSTOSCOPY WITH RETROGRADE URETHROGRAM/ BALLOON DILATION;  Surgeon: Ihor Gully, MD;  Location: [facility] ORS;  Service: Urology;  Laterality:  N/A;   CYSTOSCOPY WITH URETHRAL DILATATION N/A 05/01/2022   Procedure: BALLOON DILATION WITH  OPTILUME;  Surgeon: Noel Christmas, MD;  Location: [facility] ORS;  Service: Urology;  Laterality: N/A;    LAPAROTOMY N/A 12/03/2022   Procedure: EXPLORATORY LAPAROTOMY  total abdominal colectomy with end ileostomy;  Surgeon: Kinsinger, De Blanch, MD;  Location: [facility] OR;  Service: General;  Laterality: N/A;   MULTIPLE TOOTH EXTRACTIONS     POSTERIOR CERVICAL FUSION/FORAMINOTOMY N/A 11/29/2015   Procedure: Cervical Three-Cervical Seven Posterior Cervical Laminectomy with Fusion;  Surgeon: Lisbeth Renshaw, MD;  Location: [facility];  Service: Neurosurgery;  Laterality: N/A;  Cervical Three-Cervical Seven Posterior Cervical Laminectomy with Fusion   STUMP REVISION Right 10/22/2018   Procedure: REVISION RIGHT BELOW KNEE AMPUTATION;  Surgeon: Nadara Mustard, MD;  Location: [facility] OR;  Service: Orthopedics;  Laterality: Right;   STUMP REVISION Right 12/05/2018   Procedure: Revision Right Above Knee Amputation;  Surgeon: Nadara Mustard, MD;  Location: [facility] OR;  Service: Orthopedics;  Laterality: Right;   STUMP REVISION Right 05/29/2019   Procedure: REVISION RIGHT ABOVE KNEE AMPUTATION;  Surgeon: Nadara Mustard, MD;  Location: [facility] OR;  Service: Orthopedics;  Laterality: Right;   STUMP REVISION Right 06/24/2019   Procedure: STUMP REVISION;  Surgeon: Nadara Mustard, MD;  Location: [facility] OR;  Service: Orthopedics;  Laterality: Right;     Medications:   Current Facility-Administered Medications:    alum & mag hydroxide-simeth (MAALOX/MYLANTA) 200-200-20 MG/5ML suspension 30 mL, 30 mL, Oral, Q4H PRN, Noralee Stain, DO, 30 mL at 08/07/23 1544   aspirin EC tablet 81 mg, 81 mg, Oral, Daily, Hunsucker, Lesia Sago, MD, 81 mg at 08/11/23 0902   atorvastatin (LIPITOR) tablet 80 mg, 80 mg, Oral, QHS, Cristopher Peru, [name], 80 mg at 08/11/23 2155   carvedilol (COREG) tablet 6.25 mg, 6.25 mg, Oral, BID WC, Noralee Stain, DO, 6.25 mg at 08/11/23 1704   Chlorhexidine Gluconate Cloth 2 % PADS 6 each, 6 each, Topical, Daily, Lorin Glass, MD, 6 each at 08/11/23 1218   diclofenac Sodium (VOLTAREN) 1 % topical gel 2 g, 2 g, Topical,  QID PRN, Joneen Roach, Debby, MD, 2 g at 08/11/23 2159   escitalopram (LEXAPRO) tablet 20 mg, 20 mg, Oral, Daily, Autry, Lauren E, [name], 20 mg at 08/11/23 0902   famotidine (PEPCID) tablet 20 mg, 20 mg, Oral, Daily, Lidia Collum, [name], 20 mg at 08/11/23 0902   heparin injection 5,000 Units, 5,000 Units, Subcutaneous, Q8H, Lidia Collum, [name], 5,000 Units at 08/12/23 0503   hydrALAZINE (APRESOLINE) injection 10-40 mg, 10-40 mg, Intravenous, Q4H PRN, Pia Mau D, [name]   hydrALAZINE (APRESOLINE) tablet 10 mg, 10 mg, Oral, Q8H, Autry, Lauren E, [name], 10 mg at 08/11/23 2156   HYDROmorphone (DILAUDID) tablet 3 mg, 3 mg, Oral, Q6H, Marolyn Haller, MD, 3 mg at 08/12/23 0037   ipratropium-albuterol (DUONEB) 0.5-2.5 (3) MG/3ML nebulizer solution 3 mL, 3 mL, Nebulization, Q4H PRN, Lidia Collum, [name], 3 mL at 08/05/23 0903   lactulose (CHRONULAC) 10 GM/15ML solution 10 g, 10 g, Oral, BID, Noralee Stain, DO, 10 g at 08/11/23 2155   magnesium oxide (MAG-OX) tablet 400 mg, 400 mg, Oral, Daily, Karie Fetch, MD, 400 mg at 08/11/23 6045   multivitamin with minerals tablet 1 tablet, 1 tablet, Oral, Daily, Toni Amend W, DO, 1 tablet at 08/11/23 0901   OLANZapine zydis (ZYPREXA) disintegrating tablet 5 mg, 5 mg, Oral, Daily PRN, 5 mg at 08/10/23 2252 **OR** OLANZapine (  ZYPREXA) injection 5 mg, 5 mg, Intramuscular, Daily PRN, Karie Fetch, MD, 5 mg at 08/09/23 0431   OLANZapine (ZYPREXA) tablet 10 mg, 10 mg, Oral, Daily, Karie Fetch, MD, 10 mg at 08/11/23 0902   OLANZapine zydis (ZYPREXA) disintegrating tablet 15 mg, 15 mg, Oral, QHS, Karie Fetch, MD, 15 mg at 08/11/23 2155   ondansetron (ZOFRAN-ODT) disintegrating tablet 4 mg, 4 mg, Oral, Q8H PRN, Marcelino Duster, MD, 4 mg at 08/11/23 1121   Oral care mouth rinse, 15 mL, Mouth Rinse, PRN, Kalman Shan, MD   OXcarbazepine (TRILEPTAL) tablet 150 mg, 150 mg, Oral, BID, Karie Fetch, MD, 150 mg at 08/11/23 2157   pantoprazole  (PROTONIX) EC tablet 40 mg, 40 mg, Oral, Daily, Autry, Lauren E, [name], 40 mg at 08/11/23 1610   polyethylene glycol (MIRALAX / GLYCOLAX) packet 17 g, 17 g, Oral, Daily PRN, Suzie Portela, John D, [name]   pregabalin (LYRICA) capsule 50 mg, 50 mg, Oral, BID, Marolyn Haller, MD, 50 mg at 08/11/23 2156   senna (SENOKOT) tablet 8.6 mg, 1 tablet, Oral, Daily, Marolyn Haller, MD, 8.6 mg at 08/11/23 0901   tamsulosin [facility]) capsule 0.4 mg, 0.4 mg, Oral, Daily, Toni Amend W, DO, 0.4 mg at 08/11/23 0901   [COMPLETED] thiamine (VITAMIN B1) 500 mg in sodium chloride 0.9 % 50 mL IVPB, 500 mg, Intravenous, Q8H, Last Rate: 110 mL/hr at 07/21/23 0600, Infusion Verify at 07/21/23 0600 **FOLLOWED BY** thiamine (VITAMIN B1) tablet 100 mg, 100 mg, Oral, Daily, Lorin Glass, MD, 100 mg at 08/11/23 0900   traMADol (ULTRAM) tablet 50 mg, 50 mg, Oral, Q6H PRN, Noralee Stain, DO, 50 mg at 08/11/23 2155  Allergies: Allergies  Allergen Reactions   Latex Other (See Comments)    Unknown reaction    Karie Fetch, MD, PGY-2

## 2023-08-12 NOTE — Plan of Care (Signed)

## 2023-08-12 NOTE — Progress Notes (Signed)
 PT Cancellation Note  Patient Details Name: Gregory [name] MRN: [id number] DOB: January 17, 1961   Cancelled Treatment:    Reason Eval/Treat Not Completed: Medical issues which prohibited therapy  Upon my arrival patient was finishing his lunch with assist from NT.   Pt initially agreeable to work with physical therapy but does not recall this therapist despite working with Mr. Hauser recently, and on several occasions when he did remember me during those visits.   After releasing wrist restraints pt promptly grabbed a cup of water and threw it across the room. Became very agitated and continuously started screaming "Feed me." Despite a few moments of lucidity during this episode (he was able to recall that he is at [facility] and his daughter's name is [name],) he was unable to console, and continued to scream out to be fed.  Restraints re-applied for pt safety. Bed alarm set, fall mats in place. RN aware and came to pt room to check on pt as therapist exited.  Stark difference in cognition and behavior from last therapy visit. (See notes.)  Will continue to follow and progress as tolerated.   Kathlyn Sacramento, PT, DPT [facility]  Rehabilitation Services Physical Therapist Office: [phone] Website: [email or web address]   Berton Mount 08/12/2023, 3:34 PM

## 2023-08-13 LAB — COMPREHENSIVE METABOLIC PANEL
ALT: 14 U/L (ref 0–44)
AST: 21 U/L (ref 15–41)
Albumin: 3.3 g/dL — ABNORMAL LOW (ref 3.5–5.0)
Alkaline Phosphatase: 83 U/L (ref 38–126)
Anion gap: 15 (ref 5–15)
BUN: 30 mg/dL — ABNORMAL HIGH (ref 8–23)
CO2: 17 mmol/L — ABNORMAL LOW (ref 22–32)
Calcium: 9.4 mg/dL (ref 8.9–10.3)
Chloride: 90 mmol/L — ABNORMAL LOW (ref 98–111)
Creatinine, Ser: 1.64 mg/dL — ABNORMAL HIGH (ref 0.61–1.24)
GFR, Estimated: 46 mL/min — ABNORMAL LOW (ref >60)
Glucose, Bld: 165 mg/dL — ABNORMAL HIGH (ref 70–99)
Potassium: 5.7 mmol/L — ABNORMAL HIGH (ref 3.5–5.1)
Sodium: 122 mmol/L — ABNORMAL LOW (ref 135–145)
Total Bilirubin: 0.8 mg/dL (ref 0.0–1.2)
Total Protein: 7.4 g/dL (ref 6.5–8.1)

## 2023-08-13 LAB — GLUCOSE, CAPILLARY
Glucose-Capillary: 166 mg/dL — ABNORMAL HIGH (ref 70–99)
Glucose-Capillary: 191 mg/dL — ABNORMAL HIGH (ref 70–99)
Glucose-Capillary: 165 mg/dL — ABNORMAL HIGH (ref 70–99)

## 2023-08-13 LAB — CBC
HCT: 29.8 % — ABNORMAL LOW (ref 39.0–52.0)
Hemoglobin: 10.4 g/dL — ABNORMAL LOW (ref 13.0–17.0)
MCH: 30.1 pg (ref 26.0–34.0)
MCHC: 34.9 g/dL (ref 30.0–36.0)
MCV: 86.4 fL (ref 80.0–100.0)
Platelets: 345 10*3/uL (ref 150–400)
RBC: 3.45 MIL/uL — ABNORMAL LOW (ref 4.22–5.81)
RDW: 16.1 % — ABNORMAL HIGH (ref 11.5–15.5)
WBC: 10.1 10*3/uL (ref 4.0–10.5)
nRBC: 0 % (ref 0.0–0.2)

## 2023-08-13 LAB — AMMONIA: Ammonia: 43 umol/L — ABNORMAL HIGH (ref 9–35)

## 2023-08-13 MED ORDER — OXYCODONE HCL 5 MG PO TABS
5.0000 mg | ORAL_TABLET | Freq: Four times a day (QID) | ORAL | Status: DC | PRN
  Administered 2023-08-14 – 2023-08-17 (×7): 5 mg via ORAL
  Filled 2023-08-13 (×7): qty 1

## 2023-08-13 NOTE — Progress Notes (Signed)
 Occupational Therapy Treatment Patient Details Name: Gregory [name] MRN: [id number] DOB: [birth year] Today's Date: 08/13/2023   History of present illness 63 yo male from [place] [facility] admitted 1/27 with AMS; Acute encephalopathy/agitation delirium. 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   OT comments  Pt eager to mobilize OOB this session, pt needing mod A for bed mobility and max +2 for pivot transfer to chair. [name] ostomy bag noted to be off and leaking, pt needing min-mod A for UB bathing/dressing tasks. Pt impulsive/agitated during session, needs cues for safety throughout. Pt presenting with impairments listed below, will follow acutely. Patient will benefit from continued inpatient follow up therapy, <3 hours/day to maximize safety/ind with ADL/functional mobility.       If plan is discharge home, recommend the following:  Two people to help with walking and/or transfers;Two people to help with bathing/dressing/bathroom;Assistance with cooking/housework;Assistance with feeding;Direct supervision/assist for medications management;Direct supervision/assist for financial management;Assist for transportation;Help with stairs or ramp for entrance   Equipment Recommendations  Other (comment) (defer)    Recommendations for Other Services      Precautions / Restrictions Precautions Precautions: Fall;Other (comment) Recall of Precautions/Restrictions: Impaired Precaution/Restrictions Comments: Colostomy on Rt Restrictions Other Position/Activity Restrictions: No weight bearing restricitions per orders or prior PT notes       Mobility Bed Mobility Overal bed mobility: Needs Assistance Bed Mobility: Supine to Sit Rolling: Mod  assist              Transfers Overall transfer level: Needs assistance Equipment used: 2 person hand held assist Transfers: Bed to chair/wheelchair/BSC, Sit to/from Stand   Stand pivot transfers: Max assist, +2 physical assistance, Mod assist               Balance Overall balance assessment: Needs assistance Sitting-balance support: No upper extremity supported, Feet supported Sitting balance-Leahy Scale: Fair     Standing balance support: Bilateral upper extremity supported, Reliant on assistive device for balance Standing balance-Leahy Scale: Poor Standing balance comment: reliant on external support                           ADL either performed or assessed with clinical judgement   ADL Overall ADL's : Needs assistance/impaired     Grooming: Wash/dry face;Sitting   Upper Body Bathing: Minimal assistance;Moderate assistance       Upper Body Dressing : Minimal assistance;Sitting       Toilet Transfer: Maximal assistance;+2 for physical assistance Toilet Transfer Details (indicate cue type and reason): pivot to chair         Functional mobility during ADLs: Maximal assistance;+2 for physical assistance      Extremity/Trunk Assessment Upper Extremity Assessment Upper Extremity Assessment: Generalized weakness   Lower Extremity Assessment Lower Extremity Assessment: Defer to PT evaluation        Vision   Additional Comments: L eye blind per chart review, difficulty locating items on L, i.e. food tray and call light   Perception Perception Perception: Not tested   Praxis Praxis Praxis: Not tested   Communication Communication Communication: No apparent difficulties   Cognition Arousal: Alert Behavior During Therapy: Agitated, Impulsive Cognition: No family/caregiver present to determine baseline  OT - Cognition Comments: impatiently yelling for nurse and attempting to mobilize OOB prior to therapist and NT being  ready                 Following commands: Impaired Following commands impaired: Follows one step commands with increased time      Cueing   Cueing Techniques: Verbal cues, Gestural cues, Tactile cues  Exercises      Shoulder Instructions       General Comments VSS    Pertinent Vitals/ Pain       Pain Assessment Pain Assessment: Faces Pain Score: 6  Faces Pain Scale: Hurts even more Pain Location: RLE Pain Descriptors / Indicators: Discomfort Pain Intervention(s): Limited activity within patient's tolerance, Monitored during session, Repositioned  Home Living Family/patient expects to be discharged to:: Skilled nursing facility                                 Additional Comments: from [facility] SNF      Prior Functioning/Environment              Frequency  Min 1X/week        Progress Toward Goals  OT Goals(current goals can now be found in the care plan section)  Progress towards OT goals: Progressing toward goals  Acute Rehab OT Goals OT Goal Formulation: With patient Time For Goal Achievement: 08/21/23 Potential to Achieve Goals: Good ADL Goals Pt Will Perform Eating: with supervision;sitting Pt Will Perform Grooming: with supervision;sitting Pt Will Perform Upper Body Dressing: with contact guard assist;sitting Additional ADL Goal #1: Pt will sit with CGA x 10 minutes in preparation for ADLs. Additional ADL Goal #2: Pt will be demonstrate sustained attention as a precursor to ADLs.  Plan      Co-evaluation                 AM-PAC OT "6 Clicks" Daily Activity     Outcome Measure   Help from another person eating meals?: A Little Help from another person taking care of personal grooming?: A Little Help from another person toileting, which includes using toliet, bedpan, or urinal?: A Lot Help from another person bathing (including washing, rinsing, drying)?: A Lot Help from another person to put on and taking off  regular upper body clothing?: A Lot Help from another person to put on and taking off regular lower body clothing?: A Lot 6 Click Score: 14    End of Session Equipment Utilized During Treatment: Gait belt  OT Visit Diagnosis: Muscle weakness (generalized) (M62.81);Other symptoms and signs involving cognitive function   Activity Tolerance Patient tolerated treatment well   Patient Left in chair;with call bell/phone within reach;with nursing/sitter in room   Nurse Communication Mobility status        Time: 0981-1914 OT Time Calculation (min): 17 min  Charges: OT General Charges $OT Visit: 1 Visit OT Treatments $Self Care/Home Management : 8-22 mins  Carver Fila, OTD, OTR/L SecureChat Preferred Acute Rehab [phone]   [name] 08/13/2023, 12:17 PM

## 2023-08-13 NOTE — Progress Notes (Signed)
 PROGRESS NOTE    Gregory [name]  XLK:[id number] DOB: 1960/11/16 DOA: 07/15/2023 PCP: [name], Provider, MD     Brief Narrative:  Gregory [name] is a 63 year old male with history of diabetes mellitus type 2, hypertension, hyperlipidemia, chronic kidney disease stage IIIb, COPD, chronic osteomyelitis status post right AKA, left midfoot amputation, unspecified stroke, A-fib, chronic diastolic heart failure, mesenteric ischemia status post colostomy and recent hospitalization from 06/27/2023 to 07/11/2023 for septic shock due to ESBL UTI and multifocal pneumonia with rhinovirus infection/ acute respiratory failure with hypoxia requiring intubation and extubation/ acute encephalopathy/ acute ischemic stroke and subsequent discharge to SNF presented with severe agitation and delirium.    He was admitted to ICU and managed with Precedex.  He was transferred to [facility] service from 07/23/2023 onwards. Palliative care and psychiatry following. Currently pending SNF placement once mental status better.   He is intermittently agitates, delirious requiring restraints, haldol. Psych team consulted and assisted with delirium and medication management.  New events last 24 hours / Subjective: Patient being placed back on restraints this morning.    Assessment & Plan:   Principal Problem:   Acute encephalopathy Active Problems:   Malnutrition of moderate degree   Persistent hyperactive delirium due to multiple etiologies   Agitation   Persistent encephalopathy Hyperactive delirium ?Vascular dementia or frontal lobe dementia  At baseline when discharged 07/11/2023: Patient was lucid, able to feed, and bathe himself. He lives [facility] since [facility]. His daughter makes decisions for his care. Initially admitted to ICU, required Precedex. Ammonia, VBG with no abnormalities to explain patient's presentation. CT of the head was negative for acute intracranial abnormality. Currently no obvious reversible etiology of  delirium has been found.    Ammonia is elevated despite discontinuing Depakote.  Lactulose started.  Psychiatry signed off 2/24.  Concern that this may be his new baseline of waxing and waning encephalopathy and delirium. Possibly underlying vascular dementia. He has moments where he is alert and oriented x 3 and cooperative and calm. Then he has moments of decline when he will be agitated, takes off ostomy bag, throws items in room and needs restraints for safety. Unfortunately cannot discharge to SNF until he is restraint/sitter free for 48 hours. Will ask TOC to see if he would be candidate for memory care unit or geri-psych. Daughter is not ready for hospice.   Possible Urinary Tract Infection Finished 5 days of ceftriaxone.    COPD not in acute exacerbation  Stable. Continue current nebulized regimen   HFpEF  Hypertension Hyperlipidemia Hypotension Resolved  BP remains stable Continue Coreg, hydralazine and statin.     Diabetes mellitus type 2 Hypoglycemia resolved Well controlled. Has had some low blood sugars in the setting of poor oral intake.     Acute kidney injury CKD stage IIIb Kidney function at baseline. Avoid nephrotoxic medicaitons.    Chronic pain syndrome Continue current regimen. Continue dilaudid, added tramadol    Depression/anxiety Continue current regimen per psychiatry     Anemia of chronic disease Possibly from chronic kidney disease. No iron deficiency. Vit B12 WNL.  Hgb stable    Thrombocytopenia  Resolved    Goals of care Palliative care and medical team had discussion with patient's daughter. She would like patient to remain full code at this time as this was his wishes prior to his confusion.   Daughter wishes to get a Engineer, site for patient once he goes back to [facility].    Generalized deconditioning PT  recommending SNF placement.  Back to [facility] if mental status better. TOC aware.    DVT prophylaxis:  heparin injection  5,000 Units Start: 07/16/23 0600  Code Status: Full Family Communication: None at bedside; updated daughter over the phone today  Disposition Plan: SNF Status is: Inpatient Remains inpatient appropriate because: delirium.  He needs to be out of his mittens and telemetry sitter/sitter discontinued for 48 hours prior to discharge.    Antimicrobials:  Anti-infectives (From admission, onward)    Start     Dose/Rate Route Frequency Ordered Stop   07/26/23 1530  sulfamethoxazole-trimethoprim (BACTRIM DS) 800-160 MG per tablet 1 tablet  Status:  Discontinued        1 tablet Oral Every 12 hours 07/26/23 1439 07/26/23 1439   07/26/23 1500  cefTRIAXone (ROCEPHIN) 1 g in sodium chloride 0.9 % 100 mL IVPB        1 g 200 mL/hr over 30 Minutes Intravenous Every 24 hours 07/26/23 1440 07/31/23 1459   07/16/23 2200  vancomycin (VANCOREADY) IVPB 750 mg/150 mL  Status:  Discontinued        750 mg 150 mL/hr over 60 Minutes Intravenous Every 12 hours 07/16/23 0739 07/16/23 1243   07/16/23 2200  vancomycin (VANCOCIN) 750 mg in sodium chloride 0.9 % 250 mL IVPB  Status:  Discontinued       Note to Pharmacy: Indication: Sepsis   750 mg 265 mL/hr over 60 Minutes Intravenous Every 12 hours 07/16/23 1243 07/17/23 0821   07/16/23 0800  vancomycin (VANCOREADY) IVPB 1750 mg/350 mL        1,750 mg 175 mL/hr over 120 Minutes Intravenous  Once 07/16/23 0705 07/16/23 1137   07/16/23 0800  piperacillin-tazobactam (ZOSYN) IVPB 3.375 g  Status:  Discontinued        3.375 g 12.5 mL/hr over 240 Minutes Intravenous Every 8 hours 07/16/23 0705 07/17/23 0821        Objective: Vitals:   08/12/23 2017 08/13/23 0430 08/13/23 0500 08/13/23 0829  BP: (!) 121/91 (!) 124/53  133/62  Pulse: 62 60  60  Resp: 18 18  18   Temp: 97.8 F (36.6 C) 98.2 F (36.8 C)  97.9 F (36.6 C)  TempSrc: Oral Oral  Oral  SpO2: 97% 98%  98%  Weight:   85.7 kg   Height:        Intake/Output Summary (Last 24 hours) at 08/13/2023  1153 Last data filed at 08/13/2023 0545 Gross per 24 hour  Intake 480 ml  Output 1130 ml  Net -650 ml   Filed Weights   08/11/23 0500 08/12/23 0500 08/13/23 0500  Weight: 78 kg 76.6 kg 85.7 kg   Examination: General exam: Appears agitated but not delirious  Respiratory system: Respiratory effort normal. Central nervous system: Alert Extremities: Right LE AKA   Data Reviewed: I have personally reviewed following labs and imaging studies  CBC: Recent Labs  Lab 08/09/23 0937  WBC 11.2*  HGB 9.8*  HCT 29.3*  MCV 90.4  PLT 207   Basic Metabolic Panel: Recent Labs  Lab 08/09/23 0937  NA 131*  K 4.8  CL 100  CO2 19*  GLUCOSE 114*  BUN 27*  CREATININE 1.66*  CALCIUM 8.9   GFR: Estimated Creatinine Clearance: 49.3 mL/min (A) (by C-G formula based on SCr of 1.66 mg/dL (H)). Liver Function Tests: Recent Labs  Lab 08/09/23 0937  AST 14*  ALT 14  ALKPHOS 70  BILITOT 0.4  PROT 6.3*  ALBUMIN 2.9*  No results for input(s): "LIPASE", "AMYLASE" in the last 168 hours. Recent Labs  Lab 08/09/23 0937  AMMONIA 42*   Coagulation Profile: No results for input(s): "INR", "PROTIME" in the last 168 hours. Cardiac Enzymes: No results for input(s): "CKTOTAL", "CKMB", "CKMBINDEX", "TROPONINI" in the last 168 hours. BNP (last 3 results) No results for input(s): "PROBNP" in the last 8760 hours. HbA1C: No results for input(s): "HGBA1C" in the last 72 hours. CBG: Recent Labs  Lab 08/12/23 1212 08/12/23 1623 08/12/23 2328 08/13/23 0614 08/13/23 0853  GLUCAP 106* 196* 159* 166* 191*   Lipid Profile: No results for input(s): "CHOL", "HDL", "LDLCALC", "TRIG", "CHOLHDL", "LDLDIRECT" in the last 72 hours. Thyroid Function Tests: No results for input(s): "TSH", "T4TOTAL", "FREET4", "T3FREE", "THYROIDAB" in the last 72 hours. Anemia Panel: No results for input(s): "VITAMINB12", "FOLATE", "FERRITIN", "TIBC", "IRON", "RETICCTPCT" in the last 72 hours. Sepsis Labs: No results  for input(s): "PROCALCITON", "LATICACIDVEN" in the last 168 hours.  No results found for this or any previous visit (from the past 240 hours).    Radiology Studies: No results found.    Scheduled Meds:  aspirin EC  81 mg Oral Daily   atorvastatin  80 mg Oral QHS   carvedilol  6.25 mg Oral BID WC   Chlorhexidine Gluconate Cloth  6 each Topical Daily   escitalopram  20 mg Oral Daily   famotidine  20 mg Oral Daily   heparin  5,000 Units Subcutaneous Q8H   hydrALAZINE  10 mg Oral Q8H   HYDROmorphone  3 mg Oral Q6H   lactulose  20 g Oral BID   magnesium oxide  400 mg Oral Daily   multivitamin with minerals  1 tablet Oral Daily   OLANZapine  10 mg Oral Daily   OLANZapine zydis  15 mg Oral QHS   OXcarbazepine  150 mg Oral BID   pantoprazole  40 mg Oral Daily   pregabalin  50 mg Oral BID   senna  1 tablet Oral Daily   tamsulosin  0.4 mg Oral Daily   thiamine  100 mg Oral Daily   Continuous Infusions:   LOS: 28 days   Time spent: 35 minutes   Noralee Stain, DO [facility] 08/13/2023, 11:53 AM   Available via Epic secure chat 7am-7pm After these hours, please refer to coverage provider listed on [email or web address]

## 2023-08-13 NOTE — Plan of Care (Signed)
   Problem: Nutrition: Goal: Adequate nutrition will be maintained Outcome: Progressing

## 2023-08-13 NOTE — Plan of Care (Signed)
  Problem: Safety: Goal: Non-violent Restraint(s) Outcome: Progressing   Problem: Clinical Measurements: Goal: Ability to maintain clinical measurements within normal limits will improve Outcome: Progressing   Problem: Activity: Goal: Risk for activity intolerance will decrease Outcome: Progressing   Problem: Nutrition: Goal: Adequate nutrition will be maintained Outcome: Progressing   Problem: Coping: Goal: Level of anxiety will decrease Outcome: Progressing   Problem: Pain Managment: Goal: General experience of comfort will improve and/or be controlled Outcome: Progressing   Problem: Safety: Goal: Ability to remain free from injury will improve Outcome: Progressing

## 2023-08-13 NOTE — Progress Notes (Signed)
 Occupational Therapy Treatment Patient Details Name: Gregory [name] MRN: [id number] DOB: August 10, 1960 Today's Date: 08/13/2023   History of present illness 63 yo male from [place] [facility] admitted 1/27 with AMS; Acute encephalopathy/agitation delirium. 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   OT comments  Pt seen to assist with to return to bed after RN changing ostomy bag while pt in chair, pt able to remain in chair for ~15 mins. Pt mod +2 for return to bed with 2 person HHA. Pt presenting with impairments listed below, will follow acutely. Patient will benefit from continued inpatient follow up therapy, <3 hours/day to maximize safety/ind with ADL/functional mobility.       If plan is discharge home, recommend the following:  Two people to help with walking and/or transfers;Two people to help with bathing/dressing/bathroom;Assistance with cooking/housework;Assistance with feeding;Direct supervision/assist for medications management;Direct supervision/assist for financial management;Assist for transportation;Help with stairs or ramp for entrance   Equipment Recommendations  Other (comment) (defer)    Recommendations for Other Services      Precautions / Restrictions Precautions Precautions: Fall;Other (comment) Recall of Precautions/Restrictions: Impaired Precaution/Restrictions Comments: Colostomy on Rt Restrictions Other Position/Activity Restrictions: No weight bearing restricitions per orders or prior PT notes       Mobility Bed Mobility Overal bed mobility: Needs Assistance Bed Mobility: Sit to Supine      Sit to supine: Min assist        Transfers Overall transfer level: Needs assistance Equipment used: 2 person  hand held assist Transfers: Bed to chair/wheelchair/BSC, Sit to/from Stand   Stand pivot transfers: Max assist, +2 physical assistance, Mod assist               Balance Overall balance assessment: Needs assistance Sitting-balance support: No upper extremity supported, Feet supported Sitting balance-Leahy Scale: Fair     Standing balance support: Bilateral upper extremity supported, Reliant on assistive device for balance Standing balance-Leahy Scale: Poor Standing balance comment: reliant on external support                           ADL either performed or assessed with clinical judgement   ADL Overall ADL's : Needs assistance/impaired                      Toilet Transfer: Moderate assistance;Maximal assistance;+2 for physical assistance Toilet Transfer Details (indicate cue type and reason): pivot to bed         Functional mobility during ADLs: Maximal assistance;+2 for physical assistance      Extremity/Trunk Assessment Upper Extremity Assessment Upper Extremity Assessment: Generalized weakness   Lower Extremity Assessment Lower Extremity Assessment: Defer to PT evaluation        Vision   Additional Comments: L eye blind per chart review, difficulty locating items on L, i.e. food tray and call light   Perception Perception Perception: Not tested   Praxis Praxis Praxis: Not tested   Communication Communication Communication: No apparent difficulties   Cognition Arousal: Alert Behavior During Therapy: Agitated, Impulsive Cognition: No family/caregiver present to determine baseline             OT - Cognition Comments: impatiently yelling for nurse and attempting to mobilize OOB prior to therapist and NT  being ready                 Following commands: Impaired Following commands impaired: Follows one step commands with increased time      Cueing   Cueing Techniques: Verbal cues, Gestural cues, Tactile cues  Exercises       Shoulder Instructions       General Comments pt seen for safe return to bed after RN providing ostomy bag change    Pertinent Vitals/ Pain       Pain Assessment Pain Assessment: Faces Pain Score: 6  Faces Pain Scale: Hurts even more Pain Location: RLE Pain Descriptors / Indicators: Discomfort Pain Intervention(s): Limited activity within patient's tolerance, Monitored during session, Repositioned  Home Living Family/patient expects to be discharged to:: Skilled nursing facility                                 Additional Comments: from [facility] SNF      Prior Functioning/Environment              Frequency  Min 1X/week        Progress Toward Goals  OT Goals(current goals can now be found in the care plan section)  Progress towards OT goals: Progressing toward goals  Acute Rehab OT Goals OT Goal Formulation: With patient Time For Goal Achievement: 08/21/23 Potential to Achieve Goals: Good ADL Goals Pt Will Perform Eating: with supervision;sitting Pt Will Perform Grooming: with supervision;sitting Pt Will Perform Upper Body Dressing: with contact guard assist;sitting Additional ADL Goal #1: Pt will sit with CGA x 10 minutes in preparation for ADLs. Additional ADL Goal #2: Pt will be demonstrate sustained attention as a precursor to ADLs.  Plan      Co-evaluation                 AM-PAC OT "6 Clicks" Daily Activity     Outcome Measure   Help from another person eating meals?: A Little Help from another person taking care of personal grooming?: A Little Help from another person toileting, which includes using toliet, bedpan, or urinal?: A Lot Help from another person bathing (including washing, rinsing, drying)?: A Lot Help from another person to put on and taking off regular upper body clothing?: A Lot Help from another person to put on and taking off regular lower body clothing?: A Lot 6 Click Score: 14    End of Session  Equipment Utilized During Treatment: Gait belt  OT Visit Diagnosis: Muscle weakness (generalized) (M62.81);Other symptoms and signs involving cognitive function   Activity Tolerance Patient tolerated treatment well   Patient Left in bed;with call bell/phone within reach;with bed alarm set;with nursing/sitter in room;with restraints reapplied (bil wrist restraints)   Nurse Communication Mobility status        Time: 1138-1150 OT Time Calculation (min): 12 min  Charges: OT General Charges $OT Visit: 1 Visit OT Treatments $Self Care/Home Management : 8-22 mins $Therapeutic Activity: 8-22 mins  Carver Fila, OTD, OTR/L SecureChat Preferred Acute Rehab [phone]   Dalphine Handing 08/13/2023, 12:33 PM

## 2023-08-13 NOTE — Plan of Care (Signed)
  Problem: Nutrition: Goal: Adequate nutrition will be maintained 08/13/2023 1929 by Vernard Gambles, LPN Outcome: Progressing 08/13/2023 1912 by Vernard Gambles, LPN Outcome: Progressing   Problem: Pain Managment: Goal: General experience of comfort will improve and/or be controlled Outcome: Progressing   Problem: Safety: Goal: Ability to remain free from injury will improve Outcome: Progressing

## 2023-08-14 ENCOUNTER — Inpatient Hospital Stay (HOSPITAL_COMMUNITY): Payer: Medicaid Other

## 2023-08-14 DIAGNOSIS — G9340 Encephalopathy, unspecified: Secondary | ICD-10-CM | POA: Diagnosis not present

## 2023-08-14 LAB — CBC WITH DIFFERENTIAL/PLATELET
Abs Immature Granulocytes: 0.03 10*3/uL (ref 0.00–0.07)
Basophils Absolute: 0.1 10*3/uL (ref 0.0–0.1)
Basophils Relative: 1 %
Eosinophils Absolute: 0.3 10*3/uL (ref 0.0–0.5)
Eosinophils Relative: 4 %
HCT: 30.2 % — ABNORMAL LOW (ref 39.0–52.0)
Hemoglobin: 10.2 g/dL — ABNORMAL LOW (ref 13.0–17.0)
Immature Granulocytes: 0 %
Lymphocytes Relative: 23 %
Lymphs Abs: 1.7 10*3/uL (ref 0.7–4.0)
MCH: 29.5 pg (ref 26.0–34.0)
MCHC: 33.8 g/dL (ref 30.0–36.0)
MCV: 87.3 fL (ref 80.0–100.0)
Monocytes Absolute: 0.8 10*3/uL (ref 0.1–1.0)
Monocytes Relative: 11 %
Neutro Abs: 4.4 10*3/uL (ref 1.7–7.7)
Neutrophils Relative %: 61 %
Platelets: 259 10*3/uL (ref 150–400)
RBC: 3.46 MIL/uL — ABNORMAL LOW (ref 4.22–5.81)
RDW: 16.8 % — ABNORMAL HIGH (ref 11.5–15.5)
WBC: 7.2 10*3/uL (ref 4.0–10.5)
nRBC: 0 % (ref 0.0–0.2)

## 2023-08-14 LAB — COMPREHENSIVE METABOLIC PANEL
ALT: 12 U/L (ref 0–44)
AST: 15 U/L (ref 15–41)
Albumin: 3 g/dL — ABNORMAL LOW (ref 3.5–5.0)
Alkaline Phosphatase: 75 U/L (ref 38–126)
Anion gap: 14 (ref 5–15)
BUN: 30 mg/dL — ABNORMAL HIGH (ref 8–23)
CO2: 19 mmol/L — ABNORMAL LOW (ref 22–32)
Calcium: 9.4 mg/dL (ref 8.9–10.3)
Chloride: 93 mmol/L — ABNORMAL LOW (ref 98–111)
Creatinine, Ser: 1.51 mg/dL — ABNORMAL HIGH (ref 0.61–1.24)
GFR, Estimated: 51 mL/min — ABNORMAL LOW (ref >60)
Glucose, Bld: 130 mg/dL — ABNORMAL HIGH (ref 70–99)
Potassium: 4.9 mmol/L (ref 3.5–5.1)
Sodium: 126 mmol/L — ABNORMAL LOW (ref 135–145)
Total Bilirubin: 0.6 mg/dL (ref 0.0–1.2)
Total Protein: 6.8 g/dL (ref 6.5–8.1)

## 2023-08-14 LAB — PROTIME-INR
INR: 1 (ref 0.8–1.2)
Prothrombin Time: 13.2 s (ref 11.4–15.2)

## 2023-08-14 LAB — GLUCOSE, CAPILLARY: Glucose-Capillary: 126 mg/dL — ABNORMAL HIGH (ref 70–99)

## 2023-08-14 MED ORDER — SODIUM CHLORIDE 0.9 % IV BOLUS: 500.0000 mL | Freq: Once | INTRAVENOUS | Status: DC

## 2023-08-14 MED ORDER — CEFAZOLIN SODIUM-DEXTROSE 2-4 GM/100ML-% IV SOLN
2.0000 g | Freq: Three times a day (TID) | INTRAVENOUS | Status: DC
  Administered 2023-08-14 (×2): 2 g via INTRAVENOUS
  Filled 2023-08-14 (×2): qty 100

## 2023-08-14 MED ORDER — SODIUM CHLORIDE 0.9 % IV BOLUS
500.0000 mL | Freq: Once | INTRAVENOUS | Status: AC
  Administered 2023-08-14: 500 mL via INTRAVENOUS

## 2023-08-14 MED ORDER — FLUTICASONE PROPIONATE 50 MCG/ACT NA SUSP
1.0000 | Freq: Every day | NASAL | Status: DC
  Administered 2023-08-14 – 2023-08-18 (×4): 1 via NASAL
  Filled 2023-08-14: qty 16

## 2023-08-14 MED ORDER — SODIUM CHLORIDE 0.9 % IV SOLN: INTRAVENOUS | Status: AC

## 2023-08-14 MED ORDER — NALOXONE HCL 0.4 MG/ML IJ SOLN
INTRAMUSCULAR | Status: AC
  Filled 2023-08-14: qty 1

## 2023-08-14 NOTE — Consult Note (Addendum)
 WOC Nurse ostomy consult note Stoma type/location: RMQ ileostomy with medical adhesive related skin injury to perimeter of pouching area.  Will protect with stoma powder and skin prep  Stomal assessment/size: 1 1/2" pink patent and producing soft brown stool granuloma noted at stomal os, no signs of infection or concerns.  Peristomal assessment: denuded skin to perimeter of pouching area Treatment options for stomal/peristomal skin: stoma powder and skin prep to periwound skin to protect.  Barrier ring (LAWSON # [id number])  2 piece pouch  system  POuch (LAWSON #[id number]) Barrier (LAWSON # 2)    Output liquid green stool Ostomy pouching: 2pc. With barrier ring Education provided: cleanse skin around stoma pat dry. Apply stoma powder to moist denuded skin. Seal with skin prep.  Apply barrier ring (LAWSON # [phone]) Barrier (LAWSON # 2) POuch LAWSON # [id number] Empty when 1/3 full Enrolled patient in [employer] DC program: Yes previously Will not follow at this time.  Please re-consult if needed.  Mike Gip MSN, RN, FNP-BC CWON Wound, Ostomy, Continence Nurse Outpatient [facility] [phone] Pager [phone]

## 2023-08-14 NOTE — Progress Notes (Signed)
 Patient looks  lethargy, drowsy, unable to wake up. Vitals BP 98/52 with MAP 62 and HR 56.  MD aware and is now at the bedside. Lactulose given as order. MD ordered some stat lab. See MAR for new orders.   Discontinued wrist restraints and MD made aware. Order changed to Q4 vitals. Patient woke up  and ate 15% of his dinner. Will continue to monitor closely.

## 2023-08-14 NOTE — Progress Notes (Signed)
 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 Stroke team/neuro consulted. Recommend stroke work up, d/c meningitis/encephalitis coverage, palliative consult.  1/10 unsuccessful attempts at lumbar puncture 1/12 significant ventilator dyssynchrony, given 1 dose of Nimbex, on Versed drip 1/13 agitated overnight despite fentanyl and versed pushes and Haldol 1/14 remains agitated with sedation wean  1/16 off versed gtt, responds to painful stimuli but not following commands 1/17 awakes more easily, moves all extremities and follows simple commands  1/18 extubated to [facility]  1/19 remained off precedex  1/23 discharge by hospitalist service-sent home after having completed 7 days treatment meropenem, also treated trach aspirate-meds were adjusted downwards oxycodone was reduced to 15 Q8, he was resumed on Lexapro 1/27 patient was throwing himself on the floor at [facility].  More confused and was brought to [facility] ED for further eval.  On arrival patient alert.  Vital stable and afebrile.  UA clear.  CXR improved opacities from prior cxr 1/18. WBC 11.1 and LA 0.6 glucose 104.  Ammonia and VBG unremarkable.  CT head with no evidence of acute abnormality.  Patient agitated requiring multiple medications oxy, Ativan, Versed, morphine.  Despite medications patient required Precedex.  PCCM consulted  for ICU admission  Precedex DC patient transferred to hospitalist service on 2/3  Plan  Hypotension somnolence Combination of too many meds poor p.o. intake and possibly hyperammonemia, hyponatremia, hyperkalemia No labs have been done recently so we will order for now and in am Note that he has been hyponatremic as well as hyperkalemic Was able to arouse enough to receive lactulose Stop Dilaudid 3, tramadol 50, Zydis 15 at bedtime and oral Zydus only keep IM Zydus Lexapro and regular Zyprexa on board Only use oxycodone 5 every 6 as needed for now and can add back medications Continue lactulose 20 twice daily Will add saline 40 cc/H until labs come back and then make a decision  Persistent encephalopathy with hyperactive delirium?  Vascular/frontal lobe dementia Worsened overnight-see above  Possible urinary tract infection Received therapy with ceftriaxone through 2/11 for UTI  Cellulitis of lower extremity Started on Ancef 2 g on 2/25-given risk of encephalopathy with this I will stop the Ancef and reassess wounds again the next 24 hours I do not think that his left lower extremity is significantly worse than prior  Depression anxiety Continue Lexapro 20, olanzapine 10 daily, oxcarbazepine 150 twice daily  Lyrica 50 twice daily has been held Seroquel melatonin Klonopin all discontinued by psychiatry on follow-up 2/24 May need further workup from them and med recs to prevent somnolence  DM TY 2 CBGs 1 60-1 90 did not eat at all yesterday or today Does not require at this time sliding scale  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 Only oxycodone at this time   DVT prophylaxis: Heparin  Status is: Inpatient Remains inpatient appropriate because:   Requires further inpatient workup unstable at this time  Subjective: Alerted by nursing this afternoon patient hypotensive My first time seeing patient he is able to respond wakes up and gets irritated when I  assess and but then seems to fall asleep Note that he is not taking really any meds-received Zydis earlier this morning at around 3 AM and may be Dilaudid No labs have been done since yesterday so we are ordering some stat labs to ensure he is not further hyponatremic   Objective + exam Vitals:   08/13/23 2209 08/14/23 0557 08/14/23 0753 08/14/23 1511  BP: (!) 153/86 111/62 104/60 (!) 93/48  Pulse:  (!) 53 (!) 54 (!) 56  Resp:  18 18 18   Temp:  98.2 F (36.8 C) 98.6 F (37 C) 97.7 F (36.5 C)  TempSrc:    Oral  SpO2:  98% 100% 99%  Weight:      Height:       Filed Weights   08/11/23 0500 08/12/23 0500 08/13/23 0500  Weight: 78 kg 76.6 kg 85.7 kg    Examination: Somnolent white male no distress no icterus no pallor arousing some but falling back asleep Eventually was able to open eyes and taken lactulose CTAB no added sound no rales rhonchi Abdomen soft with ostomy in place filled with stool Right AKA noted, left leg wrapped in bandage which does not seem to have any erythema around the area Cannot assess mentation  Data Reviewed: reviewed   CBC    Component Value Date/Time   WBC 10.1 08/13/2023 2002   RBC 3.45 (L) 08/13/2023 2002   HGB 10.4 (L) 08/13/2023 2002   HGB 10.7 (L) 02/24/2020 0938   HCT 29.8 (L) 08/13/2023 2002   PLT 345 08/13/2023 2002   PLT 221 02/24/2020 0938   MCV 86.4 08/13/2023 2002   MCH 30.1 08/13/2023 2002   MCHC 34.9 08/13/2023 2002   RDW 16.1 (H) 08/13/2023 2002   LYMPHSABS 2.2 07/25/2023 0734   MONOABS 0.3 07/25/2023 0734   EOSABS 0.3 07/25/2023 0734   BASOSABS 0.0 07/25/2023 0734      Latest Ref Rng & Units 08/13/2023    8:02 PM 08/09/2023    9:37 AM 08/03/2023    7:18 AM  CMP  Glucose 70 - 99 mg/dL 098  119  147   BUN 8 - 23 mg/dL 30  27  19    Creatinine 0.61 - 1.24 mg/dL 8.29  5.62  1.30   Sodium 135 - 145 mmol/L 122  131  134   Potassium 3.5 - 5.1 mmol/L 5.7  4.8  4.5   Chloride 98 - 111 mmol/L 90  100  102   CO2 22 - 32 mmol/L  17  19  22    Calcium 8.9 - 10.3 mg/dL 9.4  8.9  9.3   Total Protein 6.5 - 8.1 g/dL 7.4  6.3  6.7   Total Bilirubin 0.0 - 1.2 mg/dL 0.8  0.4  0.5   Alkaline Phos 38 - 126 U/L 83  70  71   AST 15 - 41 U/L 21  14  19    ALT 0 - 44 U/L 14  14  13      Scheduled Meds:  aspirin EC  81 mg Oral Daily   atorvastatin  80 mg Oral QHS   carvedilol  6.25 mg Oral BID WC   Chlorhexidine Gluconate Cloth  6 each Topical Daily   escitalopram  20 mg Oral Daily   famotidine  20 mg Oral Daily   heparin  5,000 Units  Subcutaneous Q8H   hydrALAZINE  10 mg Oral Q8H   HYDROmorphone  3 mg Oral Q6H   lactulose  20 g Oral BID   magnesium oxide  400 mg Oral Daily   multivitamin with minerals  1 tablet Oral Daily   OLANZapine  10 mg Oral Daily   OLANZapine zydis  15 mg Oral QHS   OXcarbazepine  150 mg Oral BID   pantoprazole  40 mg Oral Daily   pregabalin  50 mg Oral BID   senna  1 tablet Oral Daily   tamsulosin  0.4 mg Oral Daily   thiamine  100 mg Oral Daily   Continuous Infusions:   ceFAZolin (ANCEF) IV 2 g (08/14/23 1520)    Time 76  Rhetta Mura, MD  [facility]

## 2023-08-14 NOTE — Progress Notes (Signed)
 Reassessed.  Still somnolent, some hypotension still also noted. MAP 63  Will bolus IVF, Place rate of IVF, reassess when labs availble and will d/c with night MD  He is Full code  Pleas Koch, MD [name] 6:19 PM

## 2023-08-14 NOTE — Progress Notes (Signed)
 Patient is on the beach rhythm, per patient tender.  Appears cellulitic.   IV unasyn consulted.  Ostomy care consult on board

## 2023-08-14 NOTE — Progress Notes (Signed)
 Pt was sleeping and hard to arouse most of the day, took pt restraints off and tried to give him meds he was unable to sit up to take them, last observed pt sitting up eating by himself. Pt was calm and quiet. Only IV meds given nurse sup. Made aware.

## 2023-08-14 NOTE — TOC Progression Note (Signed)
 Transition of Care [facility]) - Progression Note    Patient Details  Name: Gregory [name] MRN: [id number] Date of Birth: 1960-08-08  Transition of Care [facility]) CM/SW Contact  Janae Bridgeman, RN Phone Number: 08/14/2023, 4:45 PM  Clinical Narrative:    CM spoke with Reyne Dumas, CM with [facility] facility and she is aware that patient is currently on IV antibiotics for cellulitis around the stoma area.  Emelia Loron states that the patient would need to be without Recruitment consultant and restraints before he is able to return to the facility for care.  I spoke with the patient's daughter today who came to visit at the patient's bedside today.  Daughter is a Charity fundraiser at the surgery center and hopes that patient can return to the facility soon once his behaviors have improved and he is medically stable.  Unit manager and charge RN are aware that patient will need to be without restraints as well before he can return to the LTC facility.        Expected Discharge Plan and Services                                               Social Determinants of Health (SDOH) Interventions SDOH Screenings   Food Insecurity: No Food Insecurity (07/20/2023)  Housing: Low Risk  (07/20/2023)  Transportation Needs: No Transportation Needs (07/20/2023)  Utilities: Not At Risk (07/20/2023)  Depression (PHQ2-9): Low Risk  (03/08/2021)  Tobacco Use: High Risk (07/29/2023)    Readmission Risk Interventions    09/06/2022    1:48 PM 08/27/2022   12:46 PM 05/03/2022   11:53 AM  Readmission Risk Prevention Plan  Transportation Screening Complete Complete Complete  Medication Review Oceanographer) Complete Complete Complete  PCP or Specialist appointment within 3-5 days of discharge Complete Complete Complete  HRI or Home Care Consult Complete Complete Complete  SW Recovery Care/Counseling Consult Complete Complete Complete  Palliative Care Screening Not Applicable Not Applicable Not Applicable   Skilled Nursing Facility Complete Complete Complete

## 2023-08-14 NOTE — Progress Notes (Addendum)
 Interdisciplinary Goals of Care Family Meeting   Date carried out: 08/14/2023  Location of the meeting: Bedside  Member's involved: Physician and Family Member or next of kin  Durable Power of Attorney or acting medical decision maker:  Spoke with Gregory [name] daughter and updated her. She understands that patient is encephalopathic and that this is the new normal--his delirium will wax-wane and she understands this as well  I discussed he has taken a turn towards the worse, looks sleepy, looks more ill.  No real etiology but could be dehydration etc. She understands --she is an Charity fundraiser, and she states it sometime sis usual for him to be asleep during the day and awake all night etc  She wants to continue current care  Discussion: We discussed goals of care for [employer] .    We have decided on continuing current care  Code status:   Code Status: Full Code   Disposition: SNF/LTAC  Time spent for the meeting: 10 min on phone    Rhetta Mura, MD  08/14/2023, 5:47 PM

## 2023-08-15 DIAGNOSIS — G9340 Encephalopathy, unspecified: Secondary | ICD-10-CM | POA: Diagnosis not present

## 2023-08-15 LAB — BASIC METABOLIC PANEL
Anion gap: 14 (ref 5–15)
BUN: 34 mg/dL — ABNORMAL HIGH (ref 8–23)
CO2: 14 mmol/L — ABNORMAL LOW (ref 22–32)
Calcium: 9.2 mg/dL (ref 8.9–10.3)
Chloride: 98 mmol/L (ref 98–111)
Creatinine, Ser: 1.57 mg/dL — ABNORMAL HIGH (ref 0.61–1.24)
GFR, Estimated: 49 mL/min — ABNORMAL LOW (ref >60)
Glucose, Bld: 153 mg/dL — ABNORMAL HIGH (ref 70–99)
Potassium: 4.7 mmol/L (ref 3.5–5.1)
Sodium: 126 mmol/L — ABNORMAL LOW (ref 135–145)

## 2023-08-15 LAB — BLOOD GAS, VENOUS
Acid-base deficit: 4.7 mmol/L — ABNORMAL HIGH (ref 0.0–2.0)
Bicarbonate: 19.5 mmol/L — ABNORMAL LOW (ref 20.0–28.0)
Drawn by: 8194
O2 Saturation: 85.4 %
Patient temperature: 37
pCO2, Ven: 33 mm[Hg] — ABNORMAL LOW (ref 44–60)
pH, Ven: 7.38 (ref 7.25–7.43)
pO2, Ven: 52 mm[Hg] — ABNORMAL HIGH (ref 32–45)

## 2023-08-15 LAB — AMMONIA: Ammonia: 31 umol/L (ref 9–35)

## 2023-08-15 LAB — GLUCOSE, CAPILLARY
Glucose-Capillary: 156 mg/dL — ABNORMAL HIGH (ref 70–99)
Glucose-Capillary: 171 mg/dL — ABNORMAL HIGH (ref 70–99)
Glucose-Capillary: 171 mg/dL — ABNORMAL HIGH (ref 70–99)

## 2023-08-15 LAB — CBC WITH DIFFERENTIAL/PLATELET
Abs Immature Granulocytes: 0.06 10*3/uL (ref 0.00–0.07)
Basophils Absolute: 0.1 10*3/uL (ref 0.0–0.1)
Basophils Relative: 1 %
Eosinophils Absolute: 0.3 10*3/uL (ref 0.0–0.5)
Eosinophils Relative: 3 %
HCT: 29.1 % — ABNORMAL LOW (ref 39.0–52.0)
Hemoglobin: 9.8 g/dL — ABNORMAL LOW (ref 13.0–17.0)
Immature Granulocytes: 1 %
Lymphocytes Relative: 22 %
Lymphs Abs: 2.1 10*3/uL (ref 0.7–4.0)
MCH: 29.8 pg (ref 26.0–34.0)
MCHC: 33.7 g/dL (ref 30.0–36.0)
MCV: 88.4 fL (ref 80.0–100.0)
Monocytes Absolute: 0.7 10*3/uL (ref 0.1–1.0)
Monocytes Relative: 7 %
Neutro Abs: 6.3 10*3/uL (ref 1.7–7.7)
Neutrophils Relative %: 66 %
Platelets: 247 10*3/uL (ref 150–400)
RBC: 3.29 MIL/uL — ABNORMAL LOW (ref 4.22–5.81)
RDW: 16.5 % — ABNORMAL HIGH (ref 11.5–15.5)
WBC: 9.4 10*3/uL (ref 4.0–10.5)
nRBC: 0 % (ref 0.0–0.2)

## 2023-08-15 LAB — PHOSPHORUS: Phosphorus: 4.6 mg/dL (ref 2.5–4.6)

## 2023-08-15 LAB — MAGNESIUM: Magnesium: 1.6 mg/dL — ABNORMAL LOW (ref 1.7–2.4)

## 2023-08-15 MED ORDER — SODIUM BICARBONATE 650 MG PO TABS
650.0000 mg | ORAL_TABLET | Freq: Two times a day (BID) | ORAL | Status: DC
  Administered 2023-08-15 – 2023-08-18 (×8): 650 mg via ORAL
  Filled 2023-08-15 (×8): qty 1

## 2023-08-15 MED ORDER — HALOPERIDOL LACTATE 5 MG/ML IJ SOLN
1.0000 mg | Freq: Four times a day (QID) | INTRAMUSCULAR | Status: DC | PRN
  Administered 2023-08-15 (×2): 1 mg via INTRAVENOUS
  Filled 2023-08-15 (×2): qty 1

## 2023-08-15 MED ORDER — PREGABALIN 100 MG PO CAPS
100.0000 mg | ORAL_CAPSULE | Freq: Two times a day (BID) | ORAL | Status: DC
  Administered 2023-08-15 – 2023-08-17 (×5): 100 mg via ORAL
  Filled 2023-08-15 (×5): qty 1

## 2023-08-15 NOTE — Plan of Care (Signed)
  Problem: Safety: Goal: Non-violent Restraint(s) Outcome: Progressing   Problem: Education: Goal: Knowledge of General Education information will improve Description: Including pain rating scale, medication(s)/side effects and non-pharmacologic comfort measures Outcome: Progressing   Problem: Health Behavior/Discharge Planning: Goal: Ability to manage health-related needs will improve Outcome: Progressing   Problem: Clinical Measurements: Goal: Ability to maintain clinical measurements within normal limits will improve Outcome: Progressing Goal: Will remain free from infection Outcome: Progressing Goal: Diagnostic test results will improve Outcome: Progressing Goal: Respiratory complications will improve Outcome: Progressing Goal: Cardiovascular complication will be avoided Outcome: Progressing   Problem: Activity: Goal: Risk for activity intolerance will decrease Outcome: Progressing   Problem: Nutrition: Goal: Adequate nutrition will be maintained Outcome: Progressing   Problem: Safety: Goal: Ability to remain free from injury will improve Outcome: Progressing   Problem: Pain Managment: Goal: General experience of comfort will improve and/or be controlled Outcome: Progressing   Problem: Clinical Measurements: Goal: Ability to avoid or minimize complications of infection will improve Outcome: Progressing   Problem: Skin Integrity: Goal: Risk for impaired skin integrity will decrease Outcome: Progressing

## 2023-08-15 NOTE — Progress Notes (Signed)
 Patient left foot cleansed with Normal saline. Applied Xeroform gauze and wrapped with kerlix per wound care orders.

## 2023-08-15 NOTE — Progress Notes (Signed)
 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 Stroke team/neuro consulted. Recommend stroke work up, d/c meningitis/encephalitis coverage, palliative consult.  1/10 unsuccessful attempts at lumbar puncture 1/12 significant ventilator dyssynchrony, given 1 dose of Nimbex, on Versed drip 1/13 agitated overnight despite fentanyl and versed pushes and Haldol 1/14 remains agitated with sedation wean  1/16 off versed gtt, responds to painful stimuli but not following commands 1/17 awakes more easily, moves all extremities and follows simple commands  1/18 extubated to [facility]  1/19 remained off precedex  1/23 discharge by hospitalist service-sent home after having completed 7 days treatment meropenem, also treated trach aspirate-meds were adjusted downwards oxycodone was reduced to 15 Q8, he was resumed on Lexapro 1/27 patient was throwing himself on the floor at [facility].  More confused and was brought to [facility] ED for further eval.  On arrival patient alert.  Vital stable and afebrile.  UA clear.  CXR improved opacities from prior cxr 1/18. WBC 11.1 and LA 0.6 glucose 104.  Ammonia and VBG unremarkable.  CT head with no evidence of acute abnormality.  Patient agitated requiring multiple medications oxy, Ativan, Versed, morphine.  Despite medications patient required Precedex.  PCCM consulted  for ICU admission  Precedex DC patient transferred to hospitalist service on 2/3 2/26 CXR chronic interstitial markings without infiltrate  Plan  Hypotension somnolence Currently has resolved combination of too many meds poor p.o. intake and possibly hyperammonemia, hyponatremia, hyperkalemia Received bolus IVF, continue rate 40 cc/H NS, restrict free water 1500 cc Hypotension has completely resolved as his mental status--- do not think infected given negative x-rays Add back meds carefully as per below  Persistent encephalopathy with hyperactive delirium?  Vascular/frontal lobe dementia Per psychiatry-meds were adjusted downwards-he is mentally somewhat more clear At this time would continue Lexapro 20 , Zyprexa 10 daily with as needed Zydis injection 5 mg for agitation and Haldol 1 mg every 6 as needed agitation Oxcarbazepine 150 twice daily also being continued Add back p.o. 5 mg Zydis if starts to exhibit behaviors and if mental status remains clear  Possible urinary tract infection Received therapy with ceftriaxone through 2/11 for UTI  Cellulitis of lower extremity Started on Ancef 2 g on 2/25 stopped on 2/27 given concerns that this may cause encephalopathy Wounds reviewed on 2/26 and seem to stable with only mild erythema and I do not think that this was related to infection  Depression anxiety Seroquel melatonin Klonopin all discontinued by psychiatry on follow-up 2/24 May need further workup from them and med recs to prevent somnolence in the next several days  DM TY 2 CBGs 1 50-1 70 Does not require at this time sliding scale  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 Continue oxycodone 5 mg every 6 as needed, add back Lyrica 100 twice daily  CKD 3 AA, mild hyponatremia, metabolic acidosis Hyponatremia may have had a bearing on somnolence-now improved-watch sodium while  on Trileptal Adding back bicarb 650 twice daily and recheck labs periodically   DVT  prophylaxis: Heparin  Status is: Inpatient Remains inpatient appropriate because:   Requires further inpatient workup unstable at this time   Subjective:  "My leg hurts"-much more awake alert-no other complaints other than right stump pain About have breakfast Can orient to place person   Objective + exam Vitals:   08/14/23 1511 08/14/23 1633 08/14/23 2023 08/15/23 0802  BP: (!) 93/48 (!) 98/52 (!) 119/98 (!) 130/90  Pulse: (!) 56  84 76  Resp: 18  18 20   Temp: 97.7 F (36.5 C)  97.8 F (36.6 C)   TempSrc: Oral  Oral   SpO2: 99%  96% (!) 0%  Weight:      Height:       Filed Weights   08/11/23 0500 08/12/23 0500 08/13/23 0500  Weight: 78 kg 76.6 kg 85.7 kg    Examination:  Awake oriented x 4 Chest is clear no wheeze rales rhonchi S1-S2 no murmur no rub no gallop not on telemetry ROM intact to upper lower extremities-stump examined on right side left foot not examined today Abdomen is soft with ostomy in place   Data Reviewed: reviewed   CBC    Component Value Date/Time   WBC 9.4 08/15/2023 0701   RBC 3.29 (L) 08/15/2023 0701   HGB 9.8 (L) 08/15/2023 0701   HGB 10.7 (L) 02/24/2020 0938   HCT 29.1 (L) 08/15/2023 0701   PLT 247 08/15/2023 0701   PLT 221 02/24/2020 0938   MCV 88.4 08/15/2023 0701   MCH 29.8 08/15/2023 0701   MCHC 33.7 08/15/2023 0701   RDW 16.5 (H) 08/15/2023 0701   LYMPHSABS 2.1 08/15/2023 0701   MONOABS 0.7 08/15/2023 0701   EOSABS 0.3 08/15/2023 0701   BASOSABS 0.1 08/15/2023 0701      Latest Ref Rng & Units 08/15/2023    7:01 AM 08/14/2023    6:03 PM 08/13/2023    8:02 PM  CMP  Glucose 70 - 99 mg/dL 295  284  132   BUN 8 - 23 mg/dL 34  30  30   Creatinine 0.61 - 1.24 mg/dL 4.40  1.02  7.25   Sodium 135 - 145 mmol/L 126  126  122   Potassium 3.5 - 5.1 mmol/L 4.7  4.9  5.7   Chloride 98 - 111 mmol/L 98  93  90   CO2 22 - 32 mmol/L 14  19  17    Calcium 8.9 - 10.3 mg/dL 9.2  9.4  9.4   Total Protein 6.5 - 8.1 g/dL  6.8  7.4    Total Bilirubin 0.0 - 1.2 mg/dL  0.6  0.8   Alkaline Phos 38 - 126 U/L  75  83   AST 15 - 41 U/L  15  21   ALT 0 - 44 U/L  12  14     Scheduled Meds:  aspirin EC  81 mg Oral Daily   atorvastatin  80 mg Oral QHS   Chlorhexidine Gluconate Cloth  6 each Topical Daily   escitalopram  20 mg Oral Daily   famotidine  20 mg Oral Daily   fluticasone  1 spray Each Nare Daily   heparin  5,000 Units Subcutaneous Q8H   lactulose  20 g Oral BID   magnesium oxide  400 mg Oral Daily   multivitamin with minerals  1 tablet Oral Daily   OLANZapine  10 mg Oral Daily  OXcarbazepine  150 mg Oral BID   pantoprazole  40 mg Oral Daily   pregabalin  100 mg Oral BID   senna  1 tablet Oral Daily   sodium bicarbonate  650 mg Oral BID   tamsulosin  0.4 mg Oral Daily   thiamine  100 mg Oral Daily   Continuous Infusions:  sodium chloride 40 mL/hr at 08/14/23 1853   sodium chloride      Time 26  Rhetta Mura, MD  [facility]

## 2023-08-16 DIAGNOSIS — G9340 Encephalopathy, unspecified: Secondary | ICD-10-CM | POA: Diagnosis not present

## 2023-08-16 LAB — CBC WITH DIFFERENTIAL/PLATELET
Abs Immature Granulocytes: 0.04 10*3/uL (ref 0.00–0.07)
Basophils Absolute: 0.1 10*3/uL (ref 0.0–0.1)
Basophils Relative: 2 %
Eosinophils Absolute: 0.2 10*3/uL (ref 0.0–0.5)
Eosinophils Relative: 3 %
HCT: 28.2 % — ABNORMAL LOW (ref 39.0–52.0)
Hemoglobin: 9.5 g/dL — ABNORMAL LOW (ref 13.0–17.0)
Immature Granulocytes: 1 %
Lymphocytes Relative: 25 %
Lymphs Abs: 1.6 10*3/uL (ref 0.7–4.0)
MCH: 30.2 pg (ref 26.0–34.0)
MCHC: 33.7 g/dL (ref 30.0–36.0)
MCV: 89.5 fL (ref 80.0–100.0)
Monocytes Absolute: 0.3 10*3/uL (ref 0.1–1.0)
Monocytes Relative: 5 %
Neutro Abs: 4 10*3/uL (ref 1.7–7.7)
Neutrophils Relative %: 64 %
Platelets: 297 10*3/uL (ref 150–400)
RBC: 3.15 MIL/uL — ABNORMAL LOW (ref 4.22–5.81)
RDW: 16.6 % — ABNORMAL HIGH (ref 11.5–15.5)
WBC: 6.2 10*3/uL (ref 4.0–10.5)
nRBC: 0 % (ref 0.0–0.2)

## 2023-08-16 LAB — BASIC METABOLIC PANEL
Anion gap: 9 (ref 5–15)
BUN: 31 mg/dL — ABNORMAL HIGH (ref 8–23)
CO2: 19 mmol/L — ABNORMAL LOW (ref 22–32)
Calcium: 9.2 mg/dL (ref 8.9–10.3)
Chloride: 102 mmol/L (ref 98–111)
Creatinine, Ser: 1.57 mg/dL — ABNORMAL HIGH (ref 0.61–1.24)
GFR, Estimated: 49 mL/min — ABNORMAL LOW (ref >60)
Glucose, Bld: 241 mg/dL — ABNORMAL HIGH (ref 70–99)
Potassium: 4.9 mmol/L (ref 3.5–5.1)
Sodium: 130 mmol/L — ABNORMAL LOW (ref 135–145)

## 2023-08-16 LAB — GLUCOSE, CAPILLARY
Glucose-Capillary: 116 mg/dL — ABNORMAL HIGH (ref 70–99)
Glucose-Capillary: 128 mg/dL — ABNORMAL HIGH (ref 70–99)
Glucose-Capillary: 136 mg/dL — ABNORMAL HIGH (ref 70–99)
Glucose-Capillary: 186 mg/dL — ABNORMAL HIGH (ref 70–99)

## 2023-08-16 MED ORDER — MELATONIN 3 MG PO TABS
6.0000 mg | ORAL_TABLET | Freq: Every evening | ORAL | Status: DC | PRN
Start: 2023-08-16 — End: 2023-08-19
  Administered 2023-08-16 – 2023-08-18 (×2): 6 mg via ORAL
  Filled 2023-08-16 (×4): qty 2

## 2023-08-16 NOTE — Plan of Care (Signed)
   Problem: Safety: Goal: Non-violent Restraint(s) Outcome: Progressing   Problem: Education: Goal: Knowledge of General Education information will improve Description: Including pain rating scale, medication(s)/side effects and non-pharmacologic comfort measures Outcome: Progressing   Problem: Health Behavior/Discharge Planning: Goal: Ability to manage health-related needs will improve Outcome: Progressing   Problem: Clinical Measurements: Goal: Ability to maintain clinical measurements within normal limits will improve Outcome: Progressing Goal: Will remain free from infection Outcome: Progressing Goal: Diagnostic test results will improve Outcome: Progressing Goal: Respiratory complications will improve Outcome: Progressing Goal: Cardiovascular complication will be avoided Outcome: Progressing   Problem: Activity: Goal: Risk for activity intolerance will decrease Outcome: Progressing

## 2023-08-16 NOTE — TOC Progression Note (Signed)
 Transition of Care [facility]) - Progression Note    Patient Details  Name: Gregory [name] MRN: [id number] Date of Birth: [birth year]  [facility]) CM/SW Contact  Nykeria Mealing A [place], LCSW Phone Number: 08/16/2023, 2:29 PM  Clinical Narrative:     CSW met with Gregory [name] at [facility], spoke with pt's bedside nurse regarding pt's recent behaviors and med changes. Nurse informed her that pt was not yet stable due to sodium levels but he has been sleeping better during the night and has not required any restraints in the last 48 hours.   She stated that once pt is stable, confirm with facility with bed availability due to no current semi-private male beds available today or more likely over the weekend.    TOC will continue to follow.        Expected Discharge Plan and Services                                               Social Determinants of Health (SDOH) Interventions SDOH Screenings   Food Insecurity: No Food Insecurity (07/20/2023)  Housing: Low Risk  (07/20/2023)  Transportation Needs: No Transportation Needs (07/20/2023)  Utilities: Not At Risk (07/20/2023)  Depression (PHQ2-9): Low Risk  (03/08/2021)  Tobacco Use: High Risk (07/29/2023)    Readmission Risk Interventions    09/06/2022    1:48 PM 08/27/2022   12:46 PM 05/03/2022   11:53 AM  Readmission Risk Prevention Plan  Transportation Screening Complete Complete Complete  Medication Review Oceanographer) Complete Complete Complete  PCP or Specialist appointment within 3-5 days of discharge Complete Complete Complete  HRI or Home Care Consult Complete Complete Complete  SW Recovery Care/Counseling Consult Complete Complete Complete  Palliative Care Screening Not Applicable Not Applicable Not Applicable  Skilled Nursing Facility Complete Complete Complete

## 2023-08-16 NOTE — Progress Notes (Addendum)
 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 Stroke team/neuro consulted. Recommend stroke work up, d/c meningitis/encephalitis coverage, palliative consult.  1/10 unsuccessful attempts at lumbar puncture 1/12 significant ventilator dyssynchrony, given 1 dose of Nimbex, on Versed drip 1/13 agitated overnight despite fentanyl and versed pushes and Haldol 1/14 remains agitated with sedation wean  1/16 off versed gtt, responds to painful stimuli but not following commands 1/17 awakes more easily, moves all extremities and follows simple commands  1/18 extubated to [facility]  1/19 remained off precedex  1/23 discharge by hospitalist service-sent home after having completed 7 days treatment meropenem, also treated trach aspirate-meds were adjusted downwards oxycodone was reduced to 15 Q8, he was resumed on Lexapro 1/27 patient was throwing himself on the floor at [facility]  More confused and was brought to [facility] ED for further eval.  On arrival patient alert.  Vital stable and afebrile.  UA clear.  CXR improved opacities from prior cxr 1/18. WBC 11.1 and LA 0.6 glucose 104.  Ammonia and VBG unremarkable.  CT head with no evidence of acute abnormality.  Patient agitated requiring multiple medications oxy, Ativan, Versed, morphine.  Despite medications patient required Precedex.  PCCM  consulted for ICU admission  Precedex DC patient transferred to hospitalist service on 2/3 2/26 CXR chronic interstitial markings without infiltrate   Plan   Hypotension somnolence esolved -cause-?polypharmacy/use of ancef and hyponatremia Received Haldol overnight 2/27--have completely d/c it---he is rousable Received bolus IVF, continue rate 40 cc/H NS, restrict free water 1500 cc Doubt infection given negative x-rays Add back meds carefully as per below   Persistent encephalopathy with hyperactive delirium?  Vascular/frontal lobe dementia Per psychiatry-meds were adjusted downwards-he is mentally somewhat more clear continue Lexapro 20 , Zyprexa 10 daily with as needed Zydis injection 5 mg for agitation and Haldol 1 mg every 6 as needed agitation Oxcarbazepine 150 twice daily also being continued Add back p.o. 5 mg Zydis if starts to exhibit behaviors    Possible urinary tract infection Received therapy with ceftriaxone through 2/11 for UTI   Cellulitis of lower extremity Started on Ancef 2 g on 2/25 stopped on 2/27 given concerns encephalopathy Wounds reviewed on 2/26 and seem to stable with only mild erythema and I do not think that this was related to infection   Depression anxiety Seroquel melatonin Klonopin all discontinued by psychiatry on follow-up 2/24 May need further workup from them and med recs to prevent somnolence in the next several days   DM TY 2 CBGs 110-170 Does not require at this time sliding scale   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  Cannot obtain ROS     Objective + exam       Vitals:    08/15/23 1700 08/15/23 1941 08/16/23 0746 08/16/23 1304  BP: 132/75 132/86 (!) 93/58 (!) 126/93  Pulse: 78 87 (!) 56 (!) 57  Resp: 17 17 18 18   Temp: 98.6 F (37 C) 98.7 F (37.1 C) 98.1 F (36.7 C) 97.8 F (36.6 C)  TempSrc: Oral Oral Oral Oral  SpO2: 100% 100% 99% 96%  Weight:          Height:                 Filed Weights    08/11/23 0500 08/12/23 0500 08/13/23 0500  Weight: 78 kg 76.6 kg 85.7 kg      Examination:   Sleepy rousable S1 s 2no m/r/g Cta b no wheeze rales rhonchi Abd soft, ostomy half full with air R amputation site clearn Prior amputated toe on the L side has some slough--being dressed with iodoform dressing     Data Reviewed: reviewed    CBC Labs (Brief)          Component Value Date/Time    WBC 9.4 08/15/2023 0701    RBC 3.29 (L) 08/15/2023 0701    HGB 9.8 (L) 08/15/2023 0701    HGB 10.7 (L) 02/24/2020 0938    HCT 29.1 (L) 08/15/2023 0701    PLT 247 08/15/2023 0701    PLT 221 02/24/2020 0938    MCV 88.4 08/15/2023 0701    MCH 29.8 08/15/2023 0701    MCHC 33.7 08/15/2023 0701    RDW 16.5 (H) 08/15/2023 0701    LYMPHSABS 2.1 08/15/2023 0701    MONOABS 0.7 08/15/2023 0701    EOSABS 0.3 08/15/2023 0701    BASOSABS 0.1 08/15/2023 0701          Latest Ref Rng & Units 08/15/2023    7:01 AM 08/14/2023    6:03 PM 08/13/2023    8:02 PM  CMP  Glucose 70 - 99 mg/dL 454  098  119   BUN 8 - 23 mg/dL 34  30  30   Creatinine 0.61 - 1.24 mg/dL 1.47  8.29  5.62   Sodium 135 - 145 mmol/L 126  126  122   Potassium 3.5 - 5.1 mmol/L 4.7  4.9  5.7   Chloride 98 - 111 mmol/L 98  93  90   CO2 22 - 32 mmol/L 14  19  17    Calcium 8.9 - 10.3 mg/dL 9.2  9.4  9.4   Total Protein 6.5 - 8.1 g/dL   6.8  7.4   Total Bilirubin 0.0 - 1.2 mg/dL   0.6   0.8   Alkaline Phos 38 - 126 U/L   75  83   AST 15 - 41 U/L   15  21   ALT 0 - 44 U/L   12  14       Scheduled Meds:  aspirin EC  81 mg Oral Daily   atorvastatin  80 mg Oral QHS   Chlorhexidine Gluconate Cloth  6 each Topical Daily   escitalopram  20 mg Oral Daily   famotidine  20  mg Oral Daily   fluticasone  1 spray Each Nare Daily   heparin  5,000 Units Subcutaneous Q8H   lactulose  20 g Oral BID   magnesium oxide  400 mg Oral Daily   multivitamin with minerals  1 tablet Oral Daily   OLANZapine  10 mg Oral Daily   OXcarbazepine  150 mg Oral BID   pantoprazole  40 mg Oral Daily   pregabalin  100 mg Oral BID   senna  1 tablet Oral Daily   sodium bicarbonate  650 mg Oral BID   tamsulosin  0.4 mg Oral Daily   thiamine  100 mg Oral Daily        Continuous Infusions:  sodium chloride           Time 22

## 2023-08-16 NOTE — Progress Notes (Signed)
 PT Cancellation Note  Patient Details Name: Gregory [name] MRN: [id number] DOB: 07/28/1960   Cancelled Treatment:    Reason Eval/Treat Not Completed: Fatigue/lethargy limiting ability to participate  Somnolent, unable to remain engaged with therapist at this time, opens eyes minimally. Will continue to follow and progress as tolerated. Please secure chat myself of [facility] PT if urgent visit needed or pt looks like he can tolerate functional training.  Kathlyn Sacramento, PT, DPT [facility]  Rehabilitation Services Physical Therapist Office: [phone] Website: [email or web address]  Berton Mount 08/16/2023, 4:21 PM

## 2023-08-17 DIAGNOSIS — G9340 Encephalopathy, unspecified: Secondary | ICD-10-CM | POA: Diagnosis not present

## 2023-08-17 LAB — GLUCOSE, CAPILLARY
Glucose-Capillary: 117 mg/dL — ABNORMAL HIGH (ref 70–99)
Glucose-Capillary: 152 mg/dL — ABNORMAL HIGH (ref 70–99)

## 2023-08-17 MED ORDER — OXYCODONE HCL 5 MG PO TABS: 10.0000 mg | ORAL_TABLET | Freq: Four times a day (QID) | ORAL | Status: DC | PRN

## 2023-08-17 MED ORDER — PREGABALIN 75 MG PO CAPS
150.0000 mg | ORAL_CAPSULE | Freq: Two times a day (BID) | ORAL | Status: DC
  Administered 2023-08-17 – 2023-08-18 (×3): 150 mg via ORAL
  Filled 2023-08-17 (×3): qty 2

## 2023-08-17 MED ORDER — OXYCODONE HCL 5 MG PO TABS
10.0000 mg | ORAL_TABLET | Freq: Four times a day (QID) | ORAL | Status: DC | PRN
  Administered 2023-08-17 – 2023-08-19 (×5): 10 mg via ORAL
  Filled 2023-08-17 (×5): qty 2

## 2023-08-17 MED ORDER — OLANZAPINE 10 MG PO TABS
10.0000 mg | ORAL_TABLET | Freq: Two times a day (BID) | ORAL | Status: DC
  Administered 2023-08-17 – 2023-08-18 (×2): 10 mg via ORAL
  Filled 2023-08-17 (×3): qty 1

## 2023-08-17 NOTE — Progress Notes (Signed)
 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 Stroke team/neuro consulted. Recommend stroke work up, d/c meningitis/encephalitis coverage, palliative consult.  1/10 unsuccessful attempts at lumbar puncture 1/12 significant ventilator dyssynchrony, given 1 dose of Nimbex, on Versed drip 1/13 agitated overnight despite fentanyl and versed pushes and Haldol 1/14 remains agitated with sedation wean  1/16 off versed gtt, responds to painful stimuli but not following commands 1/17 awakes more easily, moves all extremities and follows simple commands  1/18 extubated to [facility]  1/19 remained off precedex  1/23 discharge by hospitalist service-sent home after having completed 7 days treatment meropenem, also treated trach aspirate-meds were adjusted downwards oxycodone was reduced to 15 Q8, he was resumed on Lexapro 1/27 patient was throwing himself on the floor at [facility].  More confused and was brought to [facility] ED for further eval.  On arrival patient alert.  Vital stable and afebrile.  UA clear.  CXR improved opacities from prior cxr 1/18. WBC 11.1 and LA 0.6 glucose 104.  Ammonia and VBG unremarkable.  CT head with no evidence of acute abnormality.  Patient agitated requiring multiple medications oxy, Ativan, Versed, morphine.  Despite medications patient required Precedex.  PCCM consulted  for ICU admission  Precedex DC patient transferred to hospitalist service on 2/3 2/26 CXR chronic interstitial markings without infiltrate  Plan  Hypotension somnolence resolved -cause-?polypharmacy/use of ancef and hyponatremia restrict free water 1500 cc, IVF stopped  Doubt infection foot and chest given negative x-rays Add back meds carefully as per below  Persistent encephalopathy with hyperactive delirium?  Vascular/frontal lobe dementia Per psychiatry-meds were adjusted downwards-he is mentally clear Because of slightly elevated ammonia was on lactulose and would continue 20 bid po [could have been 2/2 depakote use] continue Lexapro 20 , Zyprexa 10 increased to 10 bid Oxcarbazepine 150 twice daily  continued PRN Zydis injection 5 mg for agitation  Haldol 1 mg every 6 Dc'd  Possible urinary tract infection Received therapy with ceftriaxone through 2/11 for UTI  Cellulitis of lower extremity Started on Ancef 2 g on 2/25 stopped on 2/27 given concerns encephalopathy Wounds reviewed on 2/26 and seem to stable with only mild erythema and I do not think that this was related to infection  Depression anxiety Seroquel melatonin Klonopin all discontinued by psychiatry on follow-up 2/24 Will ask them for opinion if needed in next several days Feel some of his agitation is constitutional  DM TY 2 CBGs 150-180 Does not require at this time sliding scale  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 Increased lyrica to 150 bid Increased oxycodone to 10 q6 prn Will need scheduled meds at facility  CKD 3 AA, mild hyponatremia, metabolic acidosis Hyponatremia may have had a bearing on somnolence-now improved-watch sodium while on Trileptal Adding back bicarb 650 twice daily and recheck labs periodically Hyponatremia now improved  Discussed at bedside with daughter  DVT prophylaxis: Heparin  Status is: Inpatient  Remains inpatient appropriate because:   Requires  further inpatient workup unstable at this time   Subjective:  More coherent Quite talkative No fever Eating all meals No fever chills    Objective + exam Vitals:   08/16/23 1304 08/16/23 1939 08/17/23 0510 08/17/23 0829  BP: (!) 126/93 (!) 101/58 (!) 99/52 (!) 154/96  Pulse: (!) 57 62 (!) 57 69  Resp: 18 16 18 18   Temp: 97.8 F (36.6 C) 98.2 F (36.8 C) 98.1 F (36.7 C) 98.1 F (36.7 C)  TempSrc: Oral Oral Oral Oral  SpO2: 96% 100% 99% 98%  Weight:      Height:       Filed Weights   08/11/23 0500 08/12/23 0500 08/13/23 0500  Weight: 78 kg 76.6 kg 85.7 kg    Examination:  Awake coherent x 4 S1 s2 no m/r/g Cta b no wheeze rales rhonchi Abd soft, ostomy half full with air R amputation site clean Prior amputated toe on the L side has some slough--being dressed with iodoform dressing   Data Reviewed: reviewed   CBC    Component Value Date/Time   WBC 6.2 08/16/2023 1950   RBC 3.15 (L) 08/16/2023 1950   HGB 9.5 (L) 08/16/2023 1950   HGB 10.7 (L) 02/24/2020 0938   HCT 28.2 (L) 08/16/2023 1950   PLT 297 08/16/2023 1950   PLT 221 02/24/2020 0938   MCV 89.5 08/16/2023 1950   MCH 30.2 08/16/2023 1950   MCHC 33.7 08/16/2023 1950   RDW 16.6 (H) 08/16/2023 1950   LYMPHSABS 1.6 08/16/2023 1950   MONOABS 0.3 08/16/2023 1950   EOSABS 0.2 08/16/2023 1950   BASOSABS 0.1 08/16/2023 1950      Latest Ref Rng & Units 08/16/2023    7:50 PM 08/15/2023    7:01 AM 08/14/2023    6:03 PM  CMP  Glucose 70 - 99 mg/dL 213  086  578   BUN 8 - 23 mg/dL 31  34  30   Creatinine 0.61 - 1.24 mg/dL 4.69  6.29  5.28   Sodium 135 - 145 mmol/L 130  126  126   Potassium 3.5 - 5.1 mmol/L 4.9  4.7  4.9   Chloride 98 - 111 mmol/L 102  98  93   CO2 22 - 32 mmol/L 19  14  19    Calcium 8.9 - 10.3 mg/dL 9.2  9.2  9.4   Total Protein 6.5 - 8.1 g/dL   6.8   Total Bilirubin 0.0 - 1.2 mg/dL   0.6   Alkaline Phos 38 - 126 U/L   75   AST 15 - 41 U/L   15   ALT 0 - 44 U/L   12     Scheduled  Meds:  aspirin EC  81 mg Oral Daily   atorvastatin  80 mg Oral QHS   Chlorhexidine Gluconate Cloth  6 each Topical Daily   escitalopram  20 mg Oral Daily   famotidine  20 mg Oral Daily   fluticasone  1 spray Each Nare Daily   heparin  5,000 Units Subcutaneous Q8H   lactulose  20 g Oral BID   magnesium oxide  400 mg Oral Daily   multivitamin with minerals  1 tablet Oral Daily   OLANZapine  10 mg Oral BID   OXcarbazepine  150 mg Oral BID   pantoprazole  40 mg Oral Daily   pregabalin  150 mg Oral BID   senna  1 tablet Oral Daily  sodium bicarbonate  650 mg Oral BID   tamsulosin  0.4 mg Oral Daily   thiamine  100 mg Oral Daily   Continuous Infusions:  sodium chloride      Time 45  Rhetta Mura, MD  [facility]

## 2023-08-17 NOTE — Plan of Care (Signed)
  Problem: Safety: Goal: Non-violent Restraint(s) Outcome: Progressing   Problem: Education: Goal: Knowledge of General Education information will improve Description: Including pain rating scale, medication(s)/side effects and non-pharmacologic comfort measures Outcome: Progressing   Problem: Health Behavior/Discharge Planning: Goal: Ability to manage health-related needs will improve Outcome: Progressing   Problem: Clinical Measurements: Goal: Ability to maintain clinical measurements within normal limits will improve Outcome: Progressing Goal: Will remain free from infection Outcome: Progressing Goal: Diagnostic test results will improve Outcome: Progressing Goal: Respiratory complications will improve Outcome: Progressing Goal: Cardiovascular complication will be avoided Outcome: Progressing   Problem: Activity: Goal: Risk for activity intolerance will decrease Outcome: Progressing   Problem: Nutrition: Goal: Adequate nutrition will be maintained Outcome: Progressing   Problem: Coping: Goal: Level of anxiety will decrease Outcome: Progressing   Problem: Elimination: Goal: Will not experience complications related to bowel motility Outcome: Progressing Goal: Will not experience complications related to urinary retention Outcome: Progressing   Problem: Pain Managment: Goal: General experience of comfort will improve and/or be controlled Outcome: Progressing   Problem: Safety: Goal: Ability to remain free from injury will improve Outcome: Progressing   Problem: Skin Integrity: Goal: Risk for impaired skin integrity will decrease Outcome: Progressing   Problem: Clinical Measurements: Goal: Ability to avoid or minimize complications of infection will improve Outcome: Progressing   Problem: Skin Integrity: Goal: Skin integrity will improve Outcome: Progressing

## 2023-08-17 NOTE — Plan of Care (Signed)
°  Problem: Clinical Measurements: Goal: Will remain free from infection Outcome: Progressing   Problem: Activity: Goal: Risk for activity intolerance will decrease Outcome: Progressing   Problem: Nutrition: Goal: Adequate nutrition will be maintained Outcome: Progressing

## 2023-08-17 NOTE — Progress Notes (Signed)
 Physical Therapy Treatment Patient Details Name: Gregory [name] MRN: [id number] DOB: [birth year] Today's Date: 08/17/2023   History of Present Illness 63 yo male from Gregory [name] admitted 1/27 with AMS; Acute encephalopathy/agitation delirium. recently admitted on 1/9-1/23 with septic shock and AMS secondary to ESBL UTI and Serratia/Klebsiella multifocal pneumonia/positive rhinovirus.PMH DM2 HTN HLD CKDII COPD s/p R AKA L midfoot amputation CVA Afib mesenteric ischemia s/p colostomy 12/2022 presents to [facility] ED on 1/27 with agitation delirium.    PT Comments  Agreeable to therapy visit, complains of Rt residual limb pain throbbing sensation, denies phantom pains distal to amputation site. Requested to sit EOB. Assisted pt with roll towards Lt pulling through therapist's hand but abruptly aborts and states he is going back to sleep, becoming agitated, refused to sit EOB with therapist. Offered to help with LE exercises in bed and agreeable. Completed majority of exercises but then again became abruptly agitated and stated he wanted to be left alone and go back to sleep. Bed alarm on, fall mat in [name], call bell and personal items within reach. Patient will continue to benefit from skilled physical therapy services to further improve independence with functional mobility.     If plan is discharge home, recommend the following: Assistance with cooking/housework;Direct supervision/assist for medications management;Direct supervision/assist for financial management;Assist for transportation;Supervision due to cognitive status;A lot of help with walking and/or transfers;A lot of help with bathing/dressing/bathroom   Can travel by private vehicle     No  Equipment Recommendations  None recommended by PT    Recommendations for Other Services       Precautions / Restrictions Precautions Precautions: Fall;Other (comment) Recall of Precautions/Restrictions: Impaired Precaution/Restrictions Comments:  Colostomy on Rt Required Braces or Orthoses:  (no prosthesis here) Restrictions Weight Bearing Restrictions Per Provider Order: No Other Position/Activity Restrictions: No weight bearing restricitions per orders or prior PT notes     Mobility  Bed Mobility Overal bed mobility: Needs Assistance Bed Mobility: Rolling, Sidelying to Sit Rolling: Mod assist Sidelying to sit: HOB elevated       General bed mobility comments: Agreeable to sit EOB, hopeful to reduce RLE pain by mobilizing and performing ROM. Pt reaches for therapist hand to roll to side. Abruptly aborts and declines to sit up prior to rising to EOB. States he is going back to sleep and declines further. Becoming agitated.    Transfers                   General transfer comment: declined    Ambulation/Gait                   Stairs             Wheelchair Mobility     Tilt Bed    Modified Rankin (Stroke Patients Only)       Balance                                            Communication Communication Communication: No apparent difficulties  Cognition Arousal: Lethargic Behavior During Therapy: Agitated   PT - Cognitive impairments: No family/caregiver present to determine baseline, Awareness, Initiation, Sequencing, Problem solving                         Following commands: Impaired Following commands impaired: Follows one step commands with increased  time, Follows one step commands inconsistently    Cueing Cueing Techniques: Verbal cues, Gestural cues, Tactile cues  Exercises General Exercises - Lower Extremity Ankle Circles/Pumps: Left, 10 reps, Supine, AAROM Quad Sets: Strengthening, Left, 10 reps, Supine Short Arc Quad: AAROM, Strengthening, 10 reps, Left, Supine Straight Leg Raises: AAROM, Left, 5 reps, Supine    General Comments General comments (skin integrity, edema, etc.): Rubbing RLE constantly, redness noted. Covered LE and informed pt  to avoid due to skin irritability.      Pertinent Vitals/Pain Pain Assessment Pain Assessment: Faces Faces Pain Scale: Hurts even more Pain Location: Rt residual limb Pain Descriptors / Indicators: Sore, Throbbing Pain Intervention(s): Monitored during session, Repositioned    Home Living                          Prior Function            PT Goals (current goals can now be found in the care plan section) Acute Rehab PT Goals Patient Stated Goal: Go back to SNF PT Goal Formulation: With patient Time For Goal Achievement: 08/19/23 Potential to Achieve Goals: Fair Progress towards PT goals: Not progressing toward goals - comment    Frequency    Min 1X/week      PT Plan      Co-evaluation              AM-PAC PT "6 Clicks" Mobility   Outcome Measure  Help needed turning from your back to your side while in a flat bed without using bedrails?: A Little Help needed moving from lying on your back to sitting on the side of a flat bed without using bedrails?: A Lot Help needed moving to and from a bed to a chair (including a wheelchair)?: Total Help needed standing up from a chair using your arms (e.g., wheelchair or bedside chair)?: Total Help needed to walk in hospital room?: Total Help needed climbing 3-5 steps with a railing? : Total 6 Click Score: 9    End of Session Equipment Utilized During Treatment: Gait belt Activity Tolerance: Patient tolerated treatment well Patient left: in bed;with call bell/phone within reach;with bed alarm set Nurse Communication: Mobility status PT Visit Diagnosis: Muscle weakness (generalized) (M62.81);Difficulty in walking, not elsewhere classified (R26.2);Other symptoms and signs involving the nervous system (R29.898)     Time: 1610-9604 PT Time Calculation (min) (ACUTE ONLY): 9 min  Charges:    $Therapeutic Exercise: 8-22 mins PT General Charges $$ ACUTE PT VISIT: 1 Visit                     Gregory [name],  PT, DPT [facility]  Rehabilitation Services Physical Therapist Office: [phone] Website: [email or web address]    Gregory [name] 08/17/2023, 4:13 PM

## 2023-08-18 DIAGNOSIS — G9340 Encephalopathy, unspecified: Secondary | ICD-10-CM | POA: Diagnosis not present

## 2023-08-18 LAB — GLUCOSE, CAPILLARY
Glucose-Capillary: 146 mg/dL — ABNORMAL HIGH (ref 70–99)
Glucose-Capillary: 167 mg/dL — ABNORMAL HIGH (ref 70–99)
Glucose-Capillary: 260 mg/dL — ABNORMAL HIGH (ref 70–99)

## 2023-08-18 MED ORDER — OLANZAPINE 5 MG PO TABS
15.0000 mg | ORAL_TABLET | Freq: Two times a day (BID) | ORAL | Status: DC
  Administered 2023-08-18: 15 mg via ORAL
  Filled 2023-08-18 (×3): qty 1

## 2023-08-18 MED ORDER — STERILE WATER FOR INJECTION IJ SOLN
INTRAMUSCULAR | Status: AC
Start: 2023-08-18 — End: 2023-08-18
  Administered 2023-08-18: 10 mL
  Filled 2023-08-18: qty 10

## 2023-08-18 NOTE — Progress Notes (Signed)
Pt refused AM fingerstick.

## 2023-08-18 NOTE — Progress Notes (Cosign Needed)
 Pt became progressively belligerent with staff . Throwing items in room, tearing holes in ostomy bag, yelling,cursing and hitting staff. Pt given PRN Zyprexa for agitation. Will continue to monitor.

## 2023-08-18 NOTE — Progress Notes (Signed)
 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 Stroke team/neuro consulted. Recommend stroke work up, d/c meningitis/encephalitis coverage, palliative consult.  1/10 unsuccessful attempts at lumbar puncture 1/12 significant ventilator dyssynchrony, given 1 dose of Nimbex, on Versed drip 1/13 agitated overnight despite fentanyl and versed pushes and Haldol 1/14 remains agitated with sedation wean  1/16 off versed gtt, responds to painful stimuli but not following commands 1/17 awakes more easily, moves all extremities and follows simple commands  1/18 extubated to [facility]  1/19 remained off precedex  1/23 discharge by hospitalist service-sent home after having completed 7 days treatment meropenem, also treated trach aspirate-meds were adjusted downwards oxycodone was reduced to 15 Q8, he was resumed on Lexapro 1/27 patient was throwing himself on the floor at [facility].  More confused and was brought to [facility] ED for further eval.  On arrival patient alert.  Vital stable and afebrile.  UA clear.  CXR improved opacities from prior cxr 1/18. WBC 11.1 and LA 0.6 glucose 104.  Ammonia and VBG unremarkable.  CT head with no evidence of acute abnormality.  Patient agitated requiring multiple medications oxy, Ativan, Versed, morphine.  Despite medications patient required Precedex.  PCCM consulted  for ICU admission  Precedex DC patient transferred to hospitalist service on 2/3 2/26 CXR chronic interstitial markings without infiltrate  Plan  Hypotension somnolence resolved -cause-?polypharmacy/use of ancef and hyponatremia restrict free water 1500 cc, IVF stopped  Doubt infection foot and chest given negative x-rays Add back meds carefully as per below  Persistent encephalopathy with hyperactive delirium?  Vascular/frontal lobe dementia Per psychiatry-meds were adjusted downwards-he is mentally clear and coherent--he has current behaviors that are not consistent with psychiatric diagnosis, more so behavioural 2/2 elev ammonia was on lactulose and would continue 20 bid po [could have been 2/2 depakote use] continue Lexapro 20 , Zyprexa 10 increased to 15 bid Oxcarbazepine 150 twice daily  continued PRN Zydis injection 5 mg for agitation  Haldol 1 mg every 6 Dc'd  Possible urinary tract infection Received therapy with ceftriaxone through 2/11 for UTI  Cellulitis of lower extremity Started on Ancef 2 g on 2/25 stopped on 2/27 given concerns encephalopathy Wounds reviewed on 2/26 and seem to stable with only mild erythema and I do not think that this was related to infection  Depression anxiety Seroquel melatonin Klonopin all discontinued by psychiatry on follow-up 2/24 See above--will ask for re-eval  DM TY 2 CBGs 150-180 Does not require at this time sliding scale  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 Increased lyrica to 150 bid Increased oxycodone to 10 q6 prn Will need scheduled meds at facility  CKD 3 AA, mild hyponatremia, metabolic acidosis Hyponatremia ? Contributory to somnolence-now improved-sodium improved cont bicarb 650 twice daily and recheck labs periodically  Discussed at bedside with daughter  DVT prophylaxis: Heparin  Status is: Inpatient Remains inpatient appropriate because:   Requires further inpatient workup  unstable at this  time   Subjective:  Coherent knows time place person president recalls seeing daughter yesterday Some behaviors noted today No new issues    Objective + exam Vitals:   08/17/23 1623 08/17/23 2029 08/18/23 0621 08/18/23 0900  BP: (!) 111/94 (!) 141/62 (!) 138/104 118/63  Pulse: 68 65 (!) 59 64  Resp: 18  17 18   Temp:   98.7 F (37.1 C) 98 F (36.7 C)  TempSrc:  Oral  Oral  SpO2: 95% 96% 99% 100%  Weight:      Height:       Filed Weights   08/11/23 0500 08/12/23 0500 08/13/23 0500  Weight: 78 kg 76.6 kg 85.7 kg    Examination:  Awake coherent x 4 S1 s2 no m/r/g Cta b no wheeze rales rhonchi Abd soft, ostomy in place R amputation site clean Prior amputated toe on the L side looks clean Power 5/5   Data Reviewed: reviewed   CBC    Component Value Date/Time   WBC 6.2 08/16/2023 1950   RBC 3.15 (L) 08/16/2023 1950   HGB 9.5 (L) 08/16/2023 1950   HGB 10.7 (L) 02/24/2020 0938   HCT 28.2 (L) 08/16/2023 1950   PLT 297 08/16/2023 1950   PLT 221 02/24/2020 0938   MCV 89.5 08/16/2023 1950   MCH 30.2 08/16/2023 1950   MCHC 33.7 08/16/2023 1950   RDW 16.6 (H) 08/16/2023 1950   LYMPHSABS 1.6 08/16/2023 1950   MONOABS 0.3 08/16/2023 1950   EOSABS 0.2 08/16/2023 1950   BASOSABS 0.1 08/16/2023 1950      Latest Ref Rng & Units 08/16/2023    7:50 PM 08/15/2023    7:01 AM 08/14/2023    6:03 PM  CMP  Glucose 70 - 99 mg/dL 161  096  045   BUN 8 - 23 mg/dL 31  34  30   Creatinine 0.61 - 1.24 mg/dL 4.09  8.11  9.14   Sodium 135 - 145 mmol/L 130  126  126   Potassium 3.5 - 5.1 mmol/L 4.9  4.7  4.9   Chloride 98 - 111 mmol/L 102  98  93   CO2 22 - 32 mmol/L 19  14  19    Calcium 8.9 - 10.3 mg/dL 9.2  9.2  9.4   Total Protein 6.5 - 8.1 g/dL   6.8   Total Bilirubin 0.0 - 1.2 mg/dL   0.6   Alkaline Phos 38 - 126 U/L   75   AST 15 - 41 U/L   15   ALT 0 - 44 U/L   12     Scheduled Meds:  aspirin EC  81 mg Oral Daily   atorvastatin  80 mg Oral QHS   Chlorhexidine  Gluconate Cloth  6 each Topical Daily   escitalopram  20 mg Oral Daily   famotidine  20 mg Oral Daily   fluticasone  1 spray Each Nare Daily   heparin  5,000 Units Subcutaneous Q8H   lactulose  20 g Oral BID   magnesium oxide  400 mg Oral Daily   multivitamin with minerals  1 tablet Oral Daily   OLANZapine  10 mg Oral BID   OXcarbazepine  150 mg Oral BID   pantoprazole  40 mg Oral Daily   pregabalin  150 mg Oral BID   senna  1 tablet Oral Daily   sodium bicarbonate  650 mg Oral BID   tamsulosin  0.4 mg Oral Daily   thiamine  100 mg Oral Daily   Continuous Infusions:  sodium chloride      Time 25  Rhetta Mura, MD  [facility]

## 2023-08-18 NOTE — Plan of Care (Signed)
  Problem: Clinical Measurements: Goal: Ability to maintain clinical measurements within normal limits will improve Outcome: Progressing Goal: Will remain free from infection Outcome: Progressing Goal: Diagnostic test results will improve Outcome: Progressing Goal: Respiratory complications will improve Outcome: Progressing Goal: Cardiovascular complication will be avoided Outcome: Progressing   Problem: Activity: Goal: Risk for activity intolerance will decrease Outcome: Progressing   Problem: Nutrition: Goal: Adequate nutrition will be maintained Outcome: Progressing   Problem: Coping: Goal: Level of anxiety will decrease Outcome: Progressing   Problem: Elimination: Goal: Will not experience complications related to bowel motility Outcome: Progressing Goal: Will not experience complications related to urinary retention Outcome: Progressing   Problem: Pain Managment: Goal: General experience of comfort will improve and/or be controlled Outcome: Progressing   Problem: Safety: Goal: Ability to remain free from injury will improve Outcome: Progressing   Problem: Skin Integrity: Goal: Risk for impaired skin integrity will decrease Outcome: Progressing   Problem: Clinical Measurements: Goal: Ability to avoid or minimize complications of infection will improve Outcome: Progressing   Problem: Skin Integrity: Goal: Skin integrity will improve Outcome: Progressing

## 2023-08-19 DIAGNOSIS — G9340 Encephalopathy, unspecified: Secondary | ICD-10-CM | POA: Diagnosis not present

## 2023-08-19 LAB — CULTURE, BLOOD (ROUTINE X 2)
Culture: NO GROWTH
Culture: NO GROWTH

## 2023-08-19 LAB — GLUCOSE, CAPILLARY
Glucose-Capillary: 149 mg/dL — ABNORMAL HIGH (ref 70–99)
Glucose-Capillary: 132 mg/dL — ABNORMAL HIGH (ref 70–99)

## 2023-08-19 MED ORDER — STERILE WATER FOR INJECTION IJ SOLN
INTRAMUSCULAR | Status: AC
  Filled 2023-08-19: qty 10

## 2023-08-19 MED ORDER — LACTULOSE 10 GM/15ML PO SOLN: 20.0000 g | Freq: Two times a day (BID) | ORAL | Status: AC

## 2023-08-19 MED ORDER — ESCITALOPRAM OXALATE 20 MG PO TABS: 20.0000 mg | ORAL_TABLET | Freq: Every day | ORAL | 0 refills | Status: DC

## 2023-08-19 MED ORDER — OXYCODONE HCL ER 15 MG PO T12A: 15.0000 mg | EXTENDED_RELEASE_TABLET | Freq: Three times a day (TID) | ORAL | 0 refills | Status: DC

## 2023-08-19 MED ORDER — LANTUS SOLOSTAR 100 UNIT/ML ~~LOC~~ SOPN: 5.0000 [IU] | PEN_INJECTOR | Freq: Every day | SUBCUTANEOUS | Status: DC

## 2023-08-19 MED ORDER — PREGABALIN 150 MG PO CAPS: 150.0000 mg | ORAL_CAPSULE | Freq: Two times a day (BID) | ORAL | 0 refills | Status: DC

## 2023-08-19 MED ORDER — OXCARBAZEPINE 150 MG PO TABS: 150.0000 mg | ORAL_TABLET | Freq: Two times a day (BID) | ORAL | 0 refills | Status: AC

## 2023-08-19 MED ORDER — TAMSULOSIN HCL 0.4 MG PO CAPS: 0.4000 mg | ORAL_CAPSULE | Freq: Every day | ORAL | Status: AC

## 2023-08-19 MED ORDER — ZIPRASIDONE MESYLATE 20 MG IM SOLR
10.0000 mg | Freq: Once | INTRAMUSCULAR | Status: AC
  Administered 2023-08-19: 10 mg via INTRAMUSCULAR
  Filled 2023-08-19: qty 20

## 2023-08-19 MED ORDER — OLANZAPINE 15 MG PO TABS: 15.0000 mg | ORAL_TABLET | Freq: Two times a day (BID) | ORAL | 0 refills | Status: DC

## 2023-08-19 NOTE — TOC Transition Note (Signed)
 Transition of Care [facility]) - Discharge Note   Patient Details  Name: Gregory [name] MRN: [id number] Date of Birth: [birth year]  Transition of [facility]) CM/SW Contact:  Lovetta Condie A [place], LCSW Phone Number: 08/19/2023, 3:52 PM   Clinical Narrative:     Patient will DC to: [name]  Anticipated DC date: 08/19/23  Family notified: Francesca Oman, daughter  Transport by: Sharin Mons      Per MD patient ready for DC to [facility]. RN, patient, patient's family, and facility notified of DC. Discharge Summary and FL2 sent to facility. RN to call report prior to discharge [phone] ). DC packet on chart. Ambulance transport requested for patient.     CSW will sign off for now as social work intervention is no longer needed. Please consult [place] again if new needs arise.   Final next level of care: Skilled Nursing Facility Barriers to Discharge: Barriers Resolved   Patient Goals and CMS Choice            Discharge Placement              Patient [facility] bed at:  [facility]) Patient to be transferred to facility by: [facility] Name of family member notified: Francesca Oman, daughter Patient and family notified of of transfer: 08/19/23  Discharge Plan and Services Additional resources added to the After Visit Summary for                                       Social Drivers of Health (SDOH) Interventions SDOH Screenings   Food Insecurity: No Food Insecurity (07/20/2023)  Housing: Low Risk  (07/20/2023)  Transportation Needs: No Transportation Needs (07/20/2023)  Utilities: Not At Risk (07/20/2023)  Depression (PHQ2-9): Low Risk  (03/08/2021)  Tobacco Use: High Risk (07/29/2023)     Readmission Risk Interventions    09/06/2022    1:48 PM 08/27/2022   12:46 PM 05/03/2022   11:53 AM  Readmission Risk Prevention Plan  Transportation Screening Complete Complete Complete  Medication Review Oceanographer) Complete Complete Complete  PCP or Specialist appointment within  3-5 days of discharge Complete Complete Complete  HRI or Home Care Consult Complete Complete Complete  SW Recovery Care/Counseling Consult Complete Complete Complete  Palliative Care Screening Not Applicable Not Applicable Not Applicable  Skilled Nursing Facility Complete Complete Complete

## 2023-08-19 NOTE — Consult Note (Signed)
 Brief consult note:  Primary team requested [place] to review the medications as the patient is likely getting discharged today. He continues to have some periods of agitation, but he has been off-restraints since last Wednesday.  Currently on: Trileptal 150 mg BID: Unable to titrate up due to concern of hyponatremia Zyprexa 15 BID Lexapro 20 mg  At this time, the above medication regimen seems appropriate to discharge to SNF where he will likely be followed up psychiatry. He could benefit in the SNF from switching from Trileptal (which is likely sub therapeutic) to Lithium (with close monitoring of labs) if agitation continues.  Given the concern that some of his current symptoms are more behavioral, he likely should be gradually titrated off Zyprexa pending any safety concerns.

## 2023-08-19 NOTE — Plan of Care (Signed)

## 2023-08-19 NOTE — Discharge Summary (Signed)
 Physician Discharge Summary  Gregory [name] EAV:[id number] DOB: Oct 08, 1960 DOA: 07/15/2023  PCP: [name], Provider, MD  Admit date: 07/15/2023 Discharge date: 08/19/2023  Time spent: 50 minutes  Recommendations for Outpatient Follow-up:  reQuires outpatient psychiatry follow-up given med changes-see below narrative Recommend eventual discontinuation of lactulose and monitor mental status-was on this because of mild hyperammonemia secondary to Depakote and probably does not need it long-term Note we have cut back the Lantus dose from 15-5 and placed on sliding scale may not need if sugars are not reliably above 180 Careful with pain meds Lyrica and psychotropic meds as was a little confused this hospital stay-would not escalate pain meds unless absolutely necessary tried different modalities of possible Check labs in about a  Note was prior on oxygen but not requiring the same now so probably does not need at this time  Discharge Diagnoses:  MAIN problem for hospitalization   Toxic metabolic encephalopathy on admission  Please see below for itemized issues addressed in HOpsital- refer to other progress notes for clarity if needed  Discharge Condition: Fair  Diet recommendation: Diabetic heart healthy  Filed Weights   08/12/23 0500 08/13/23 0500 08/19/23 0500  Weight: 76.6 kg 85.7 kg 88.9 kg    History of present illness:  63 year old white male known [facility] resident COPD A-fib CHADVASC >4 Mesenteric ischemia status post colostomy 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 Stroke team/neuro consulted. Recommend stroke work up, d/c meningitis/encephalitis coverage, palliative  consult.  1/10 unsuccessful attempts at lumbar puncture 1/12 significant ventilator dyssynchrony, given 1 dose of Nimbex, on Versed drip 1/13 agitated overnight despite fentanyl and versed pushes and Haldol 1/14 remains agitated with sedation wean  1/16 off versed gtt, responds to painful stimuli but not following commands 1/17 awakes more easily, moves all extremities and follows simple commands  1/18 extubated to [facility]  1/19 remained off precedex  1/23 discharge by hospitalist service-sent home after having completed 7 days treatment meropenem, also treated trach aspirate-meds were adjusted downwards oxycodone was reduced to 15 Q8, he was resumed on Lexapro 1/27 patient was throwing himself on the floor at [facility].  More confused and was brought to [facility] ED for further eval.  On arrival patient alert.  Vital stable and afebrile.  UA clear.  CXR improved opacities from prior cxr 1/18. WBC 11.1 and LA 0.6 glucose 104.  Ammonia and VBG unremarkable.  CT head with no evidence of acute abnormality.  Patient agitated requiring multiple medications oxy, Ativan, Versed, morphine.  Despite medications patient required Precedex.  PCCM consulted for ICU admission  Precedex DC patient transferred to hospitalist service on 2/3 2/26 CXR chronic interstitial markings without infiltrate   Plan   Hypotension somnolence resolved -cause-?polypharmacy/use of ancef and hyponatremia restrict free water 1500 cc, IVF stopped  Doubt infection foot and chest given negative x-rays Add back meds carefully as per below   Persistent encephalopathy with hyperactive delirium?  Vascular/frontal lobe dementia Per psychiatry-meds were adjusted downwards earlier in hospital stay and currently is mentally clear and coherent--he has current behaviors that are not consistent with psychiatric diagnosis, more so behavioural and psychiatry input appreciated on 3/3 at discharge and made recommendations continue Lexapro 20 , Zyprexa  10 increased to 15  bid Oxcarbazepine 150 twice daily  continued--as an outpatient they do recommend cutting back on the Zyprexa slightly and may be using lithium to be titrated as an outpatient by the nursing home MD or psychiatry can be consulted to help with the same  2/2 elev ammonia was on lactulose and would continue 20 bid po [could have been 2/2 depakote use]--- would consider discontinuation of lactulose in 3 to 5 days and observe mentation    Possible urinary tract infection Received therapy with ceftriaxone through 2/11 for UTI and has no issues   Cellulitis of lower extremity Does have toe erythema with open wound Started on Ancef 2 g on 2/25 stopped on 2/27 given concerns encephalopathy Wounds reviewed on 2/26 and seem to stable with only mild erythema and I do not think that this was related to infection and would not use any further meds   Depression anxiety Seroquel melatonin Klonopin all discontinued by psychiatry on follow-up 2/24 Reeval irrigation was done by psychiatry as above   DM TY 2 CBGs 150-180 Did not require sliding scale during hospital stay-note that he was on prior to admission Lantus and sliding scale coverage which has been resumed-cautiously reimplement in an outpatient setting if need is felt I have cut back the Lantus to about 5 units at discharge and may continue sliding scale   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 Increased lyrica to 150 bid for pain control Meds were temporarily held-oxycodone was gradually reimplemented and increased to 15 3 times daily scheduled at facility if mental status changes would back down off the same    CKD 3 AA, mild hyponatremia, metabolic acidosis Hyponatremia ? Contributory to somnolence-now improved-sodium improved cont bicarb 650 twice daily and recheck labs periodically   Discharge Exam: Vitals:   08/19/23 0553 08/19/23 0941  BP: (!) 103/54 (!) 141/62  Pulse: (!) 59 (!) 58  Resp: 17 15   Temp: 97.6 F (36.4 C) 97.9 F (36.6 C)  SpO2: 99% 99%    Subj on day of d/c   A little sleepy in nad Received sleep aide last night arousable  General Exam on discharge  Eomi Chest clear S1 s 2no m Abd soft, Ostomy in place Wound on L foot at toe seems scabbed-imporved  Discharge Instructions    Allergies as of 08/19/2023       Reactions   Latex Other (See Comments)   Unknown reaction        Medication List     STOP taking these medications    acetaminophen 500 MG tablet Commonly known as: TYLENOL   carvedilol 12.5 MG tablet Commonly known as: COREG   fluticasone 50 MCG/ACT nasal spray Commonly known as: FLONASE   furosemide 20 MG tablet Commonly known as: LASIX   furosemide 40 MG tablet Commonly known as: LASIX   haloperidol lactate 5 MG/ML injection Commonly known as: HALDOL   hydrALAZINE 10 MG tablet Commonly known as: APRESOLINE   hydrOXYzine 50 MG capsule Commonly known as: VISTARIL   LACTOBACILLUS PO   loperamide 2 MG capsule Commonly known as: IMODIUM   loratadine 10 MG tablet Commonly known as: CLARITIN   Mounjaro 2.5 MG/0.5ML Pen Generic drug: tirzepatide   ondansetron 4 MG tablet Commonly known as: ZOFRAN   oxybutynin 5 MG 24 hr tablet Commonly known as: DITROPAN-XL   OXYGEN   oxymetazoline 0.05 % nasal spray Commonly known as: AFRIN   QUEtiapine 100 MG tablet Commonly known as: SEROQUEL   trolamine  salicylate 10 % cream Commonly known as: ASPERCREME       TAKE these medications    aspirin EC 81 MG tablet Take 81 mg by mouth daily.   atorvastatin 80 MG tablet Commonly known as: LIPITOR Take 1 tablet (80 mg total) by mouth daily.   Cholecalciferol 25 MCG (1000 UT) capsule Take 2,000 Units by mouth daily.   Cranberry 450 MG Tabs Take 450 mg by mouth daily.   docusate sodium 100 MG capsule Commonly known as: COLACE Take 100 mg by mouth every 12 (twelve) hours as needed (constipation).    escitalopram 20 MG tablet Commonly known as: LEXAPRO Take 1 tablet (20 mg total) by mouth daily.   famotidine 20 MG tablet Commonly known as: PEPCID Take 1 tablet (20 mg total) by mouth daily.   ferrous sulfate 325 (65 FE) MG tablet Take 1 tablet (325 mg total) by mouth 3 (three) times daily with meals.   insulin lispro 100 UNIT/ML KwikPen Commonly known as: HUMALOG Inject 0-10 Units into the skin See admin instructions. Inject 0-10 units into the skin three times a day and at bedtime, PER SLIDING SCALE:  BGL 151-200 = 2 units 201-250 = 4 units 251-300 = 6 units 301-350 =  8 units 351-400 = 10 units What changed: additional instructions   ipratropium-albuterol 0.5-2.5 (3) MG/3ML Soln Commonly known as: DUONEB Take 3 mLs by nebulization every 6 (six) hours as needed (shortness of breath, wheezing).   lactulose 10 GM/15ML solution Commonly known as: CHRONULAC Take 30 mLs (20 g total) by mouth 2 (two) times daily.   Lantus SoloStar 100 UNIT/ML Solostar Pen Generic drug: insulin glargine Inject 15 Units into the skin daily. What changed: when to take this   lidocaine 4 % Place 1 patch onto the skin daily. Remove after 12 hours.   magnesium oxide 400 (240 Mg) MG tablet Commonly known as: MAG-OX Take 400 mg by mouth in the morning, at noon, and at bedtime.   Melatonin 10 MG Tabs Take 10 mg by mouth at bedtime.   multivitamin with minerals Tabs tablet Take 1 tablet by mouth daily.   Natural Balance Tears 0.1-0.3 % Soln Generic drug: Dextran 70-Hypromellose Place 1 drop into both eyes every 4 (four) hours as needed (dry eyes).   OLANZapine 15 MG tablet Commonly known as: ZYPREXA Take 1 tablet (15 mg total) by mouth 2 (two) times daily.   omeprazole 20 MG capsule Commonly known as: PRILOSEC Take 20 mg by mouth at bedtime.   OXcarbazepine 150 MG tablet Commonly known as: TRILEPTAL Take 1 tablet (150 mg total) by mouth 2 (two) times daily.   oxyCODONE 15 mg 12 hr  tablet Commonly known as: OXYCONTIN Take 1 tablet (15 mg total) by mouth in the morning, at noon, and at bedtime. What changed: Another medication with the same name was removed. Continue taking this medication, and follow the directions you see here.   pregabalin 150 MG capsule Commonly known as: LYRICA Take 1 capsule (150 mg total) by mouth 2 (two) times daily. What changed:  medication strength how much to take Another medication with the same name was removed. Continue taking this medication, and follow the directions you see here.   PROTEIN PO Take 30 mLs by mouth 2 (two) times daily. Liquid protein   senna 8.6 MG Tabs tablet Commonly known as: SENOKOT Take 2 tablets by mouth at bedtime.   sodium bicarbonate 650 MG tablet Take 650 mg by mouth 2 (two) times  daily.   tamsulosin 0.4 MG Caps capsule Commonly known as: FLOMAX Take 1 capsule (0.4 mg total) by mouth daily. What changed: how much to take   Uro-MP 118 MG Caps Take 118 mg by mouth 2 (two) times daily.       Allergies  Allergen Reactions   Latex Other (See Comments)    Unknown reaction      The results of significant diagnostics from this hospitalization (including imaging, microbiology, ancillary and laboratory) are listed below for reference.    Significant Diagnostic Studies: DG CHEST PORT 1 VIEW Result Date: 08/14/2023 CLINICAL DATA:  Altered mental status EXAM: PORTABLE CHEST 1 VIEW COMPARISON:  07/15/2023 FINDINGS: Cardiac shadow is within normal limits. Aortic calcifications are seen. The lungs are well aerated bilaterally. Persistent interstitial changes are noted without focal confluent infiltrate. No effusion is seen. Postsurgical changes in the cervical spine are noted. IMPRESSION: Chronic interstitial markings without acute infiltrate. Electronically Signed   By: Alcide Clever M.D.   On: 08/14/2023 23:46    Microbiology: Recent Results (from the past 240 hours)  Culture, blood (Routine X 2) w  Reflex to ID Panel     Status: None   Collection Time: 08/14/23  7:06 AM   Specimen: BLOOD  Result Value Ref Range Status   Specimen Description BLOOD RIGHT ANTECUBITAL  Final   Special Requests   Final    BOTTLES DRAWN AEROBIC AND ANAEROBIC Blood Culture results may not be optimal due to an inadequate volume of blood received in culture bottles   Culture   Final    NO GROWTH 5 DAYS Performed at [facility], [address], [place], [place] [address]    Report Status 08/19/2023 FINAL  Final  Culture, blood (Routine X 2) w Reflex to ID Panel     Status: None   Collection Time: 08/14/23  7:14 AM   Specimen: BLOOD LEFT HAND  Result Value Ref Range Status   Specimen Description BLOOD LEFT HAND  Final   Special Requests   Final    BOTTLES DRAWN AEROBIC ONLY Blood Culture results may not be optimal due to an inadequate volume of blood received in culture bottles   Culture   Final    NO GROWTH 5 DAYS Performed at [facility], [address], [place], [place] [address]    Report Status 08/19/2023 FINAL  Final     Labs: Basic Metabolic Panel: Recent Labs  Lab 08/13/23 2002 08/14/23 1803 08/15/23 0701 08/16/23 1950  NA 122* 126* 126* 130*  K 5.7* 4.9 4.7 4.9  CL 90* 93* 98 102  CO2 17* 19* 14* 19*  GLUCOSE 165* 130* 153* 241*  BUN 30* 30* 34* 31*  CREATININE 1.64* 1.51* 1.57* 1.57*  CALCIUM 9.4 9.4 9.2 9.2  MG  --   --  1.6*  --   PHOS  --   --  4.6  --    Liver Function Tests: Recent Labs  Lab 08/13/23 2002 08/14/23 1803  AST 21 15  ALT 14 12  ALKPHOS 83 75  BILITOT 0.8 0.6  PROT 7.4 6.8  ALBUMIN 3.3* 3.0*   No results for input(s): "LIPASE", "AMYLASE" in the last 168 hours. Recent Labs  Lab 08/13/23 2002 08/15/23 0704  AMMONIA 43* 31   CBC: Recent Labs  Lab 08/13/23 2002 08/14/23 1803 08/15/23 0701 08/16/23 1950  WBC 10.1 7.2 9.4 6.2  NEUTROABS  --  4.4 6.3 4.0  HGB 10.4* 10.2* 9.8* 9.5*  HCT 29.8* 30.2* 29.1* 28.2*  MCV 86.4 87.3 88.4  89.5  PLT 345 259 247 297   Cardiac Enzymes: No results for input(s): "CKTOTAL", "CKMB", "CKMBINDEX", "TROPONINI" in the last 168 hours. BNP: BNP (last 3 results) Recent Labs    09/13/22 0830 12/03/22 1920 06/27/23 0558  BNP 55.6 45.7 63.1    ProBNP (last 3 results) No results for input(s): "PROBNP" in the last 8760 hours.  CBG: Recent Labs  Lab 08/17/23 1140 08/18/23 0057 08/18/23 1213 08/18/23 2019 08/19/23 0554  GLUCAP 152* 167* 146* 260* 149*    Signed:  Rhetta Mura MD   [facility] 08/19/2023, 10:54 AM

## 2023-08-19 NOTE — Progress Notes (Signed)
 Report given to nurse Curly Rim at [facility].

## 2023-08-21 ENCOUNTER — Emergency Department (HOSPITAL_COMMUNITY): Admission: EM | Admit: 2023-08-21 | Discharge: 2023-08-21 | Disposition: A | Discharge status: Home / Self Care

## 2023-08-21 ENCOUNTER — Other Ambulatory Visit: Payer: Self-pay

## 2023-08-21 ENCOUNTER — Encounter (HOSPITAL_COMMUNITY): Payer: Self-pay

## 2023-08-21 DIAGNOSIS — Z794 Long term (current) use of insulin: Secondary | ICD-10-CM | POA: Insufficient documentation

## 2023-08-21 DIAGNOSIS — Z7982 Long term (current) use of aspirin: Secondary | ICD-10-CM | POA: Insufficient documentation

## 2023-08-21 DIAGNOSIS — R600 Localized edema: Secondary | ICD-10-CM | POA: Diagnosis not present

## 2023-08-21 DIAGNOSIS — G546 Phantom limb syndrome with pain: Secondary | ICD-10-CM | POA: Insufficient documentation

## 2023-08-21 DIAGNOSIS — Z91040 Latex allergy status: Secondary | ICD-10-CM | POA: Diagnosis not present

## 2023-08-21 MED ORDER — OXYCODONE HCL 5 MG PO TABS
10.0000 mg | ORAL_TABLET | Freq: Once | ORAL | Status: AC
  Administered 2023-08-21: 10 mg via ORAL
  Filled 2023-08-21: qty 2

## 2023-08-21 NOTE — Discharge Instructions (Signed)
 Continue to take pain medications as prescribed to you.  Please follow-up with your primary doctor.

## 2023-08-21 NOTE — ED Notes (Signed)
 [facility] FOR PT PICK ETA 1 HOUR

## 2023-08-21 NOTE — ED Triage Notes (Signed)
 Patient BIB [facility] from [facility] for pain in a limb that has been amputated. Patient is A&Ox4, history of being uncooperative with care, currently moaning and reporting 10/10 pain. EMS reports that he received his regular pain meds at the facility. VSS en route.

## 2023-08-21 NOTE — ED Notes (Signed)
Patient verbally aggressive with staff

## 2023-08-21 NOTE — ED Notes (Signed)
 Patient refusing to let RN wipe all of patient's stool off of his hands. Patient insisting on eating

## 2023-08-21 NOTE — ED Provider Notes (Signed)
 [place] EMERGENCY DEPARTMENT AT [facility] Provider Note   CSN: [id number] Arrival date & time: 08/21/23  1124     History  Chief Complaint  Patient presents with   Phantom Pain    Gregory [name] is a 63 y.o. male.  This is a 63 year old male presenting emergency department for phantom limb pain.  Reports pain to his stump.  Chronic pain prescribed oxycodone.  He is coming from facility, he reports that they did not give him his pain medication.        Home Medications Prior to Admission medications   Medication Sig Start Date End Date Taking? Authorizing Provider  aspirin EC 81 MG tablet Take 81 mg by mouth daily.    [provider]  atorvastatin (LIPITOR) 80 MG tablet Take 1 tablet (80 mg total) by mouth daily. 09/22/22 01/27/24  Jerald Kief, MD  Cholecalciferol 25 MCG (1000 UT) capsule Take 2,000 Units by mouth daily.    [provider]  Cranberry 450 MG TABS Take 450 mg by mouth daily.    [provider]  Dextran 70-Hypromellose (NATURAL BALANCE TEARS) 0.1-0.3 % SOLN Place 1 drop into both eyes every 4 (four) hours as needed (dry eyes).    [provider]  docusate sodium (COLACE) 100 MG capsule Take 100 mg by mouth every 12 (twelve) hours as needed (constipation).    [provider]  escitalopram (LEXAPRO) 20 MG tablet Take 1 tablet (20 mg total) by mouth daily. 08/19/23   Rhetta Mura, MD  famotidine (PEPCID) 20 MG tablet Take 1 tablet (20 mg total) by mouth daily. 07/12/23 08/11/23  Jerald Kief, MD  ferrous sulfate 325 (65 FE) MG tablet Take 1 tablet (325 mg total) by mouth 3 (three) times daily with meals. 01/23/23   Cathren Laine, MD  insulin glargine (LANTUS SOLOSTAR) 100 UNIT/ML Solostar Pen Inject 5 Units into the skin daily. 08/19/23   Rhetta Mura, MD  insulin lispro (HUMALOG) 100 UNIT/ML KwikPen Inject 0-10 Units into the skin See admin instructions. Inject 0-10 units into the skin three times a  day and at bedtime, PER SLIDING SCALE:  BGL 151-200 = 2 units 201-250 = 4 units 251-300 = 6 units 301-350 =  8 units 351-400 = 10 units Patient taking differently: Inject 0-10 Units into the skin See admin instructions. Inject 0-10 units into the skin before meals and at bedtime, PER SLIDING SCALE:  0-150 = 0 units 151-200 = 2 units 201-250 = 4 units 251-300 = 6 units 301-350 =  8 units 351-400 = 10 units 01/31/22   Dahal, Melina Schools, MD  ipratropium-albuterol (DUONEB) 0.5-2.5 (3) MG/3ML SOLN Take 3 mLs by nebulization every 6 (six) hours as needed (shortness of breath, wheezing).    [provider]  lactulose (CHRONULAC) 10 GM/15ML solution Take 30 mLs (20 g total) by mouth 2 (two) times daily. 08/19/23   Rhetta Mura, MD  lidocaine 4 % Place 1 patch onto the skin daily. Remove after 12 hours.    [provider]  magnesium oxide (MAG-OX) 400 (240 Mg) MG tablet Take 400 mg by mouth in the morning, at noon, and at bedtime.    [provider]  Melatonin 10 MG TABS Take 10 mg by mouth at bedtime.    [provider]  Meth-Hyo-M Bl-Na Phos-Ph Sal (URO-MP) 118 MG CAPS Take 118 mg by mouth 2 (two) times daily.    [provider]  Multiple Vitamin (MULTIVITAMIN WITH MINERALS) TABS  tablet Take 1 tablet by mouth daily. 12/25/22   Dorcas Carrow, MD  OLANZapine (ZYPREXA) 15 MG tablet Take 1 tablet (15 mg total) by mouth 2 (two) times daily. 08/19/23   Rhetta Mura, MD  omeprazole (PRILOSEC) 20 MG capsule Take 20 mg by mouth at bedtime.    [provider]  OXcarbazepine (TRILEPTAL) 150 MG tablet Take 1 tablet (150 mg total) by mouth 2 (two) times daily. 08/19/23   Rhetta Mura, MD  oxyCODONE (OXYCONTIN) 15 mg 12 hr tablet Take 1 tablet (15 mg total) by mouth in the morning, at noon, and at bedtime. 08/19/23   Rhetta Mura, MD  pregabalin (LYRICA) 150 MG capsule Take 1 capsule (150 mg total) by mouth 2 (two) times daily. 08/19/23    Rhetta Mura, MD  PROTEIN PO Take 30 mLs by mouth 2 (two) times daily. Liquid protein    [provider]  senna (SENOKOT) 8.6 MG TABS tablet Take 2 tablets by mouth at bedtime.    [provider]  sodium bicarbonate 650 MG tablet Take 650 mg by mouth 2 (two) times daily.    [provider]  tamsulosin (FLOMAX) 0.4 MG CAPS capsule Take 1 capsule (0.4 mg total) by mouth daily. 08/19/23   Rhetta Mura, MD      Allergies    Latex    Review of Systems   Review of Systems  Physical Exam Updated Vital Signs BP (!) 143/64   Pulse 92   Temp 98 F (36.7 C)   Resp 18   SpO2 99%  Physical Exam Vitals and nursing note reviewed.  HENT:     Head: Normocephalic.     Nose: Nose normal.     Mouth/Throat:     Mouth: Mucous membranes are moist.  Eyes:     Conjunctiva/sclera: Conjunctivae normal.  Cardiovascular:     Rate and Rhythm: Normal rate and regular rhythm.  Pulmonary:     Effort: Pulmonary effort is normal.     Breath sounds: Normal breath sounds.  Abdominal:     General: Abdomen is flat. There is no distension.     Tenderness: There is no abdominal tenderness. There is no guarding or rebound.     Comments: Ostomy in place.  Brown stool in bag  Musculoskeletal:     Comments: Left lower extremity with edema.  Right lower extremity stump with no warmth or erythema.  Skin:    General: Skin is warm.     Capillary Refill: Capillary refill takes less than 2 seconds.  Neurological:     Mental Status: He is alert. Mental status is at baseline.  Psychiatric:        Mood and Affect: Mood normal.        Behavior: Behavior normal.     ED Results / Procedures / Treatments   Labs (all labs ordered are listed, but only abnormal results are displayed) Labs Reviewed - No data to display  EKG None  Radiology No results found.  Procedures Procedures    Medications Ordered in ED Medications  oxyCODONE (Oxy IR/ROXICODONE) immediate release  tablet 10 mg (10 mg Oral Given 08/21/23 1154)    ED Course/ Medical Decision Making/ A&P                                 Medical Decision Making This is a 63 year old male presenting emergency department for phantom stump pain.  Reported to be  at his neurologic baseline.  Reportedly did not get his oxycodone at facility.  Per records he did receive a dose at 10:00 this morning.  Patient with no other complaints.  No fever no tachycardia no leukocytosis.  No obvious signs of infection.  Given another dose of oxycodone.  Will discharge in stable condition.  Risk Prescription drug management.         Final Clinical Impression(s) / ED Diagnoses Final diagnoses:  None    Rx / DC Orders ED Discharge Orders     None         Coral Spikes, DO 08/21/23 1208

## 2023-08-21 NOTE — ED Notes (Signed)
 Patient found to be urinating on floor.

## 2023-08-21 NOTE — ED Notes (Addendum)
 Patient ripped ostomy bag open and tore bag. Two piece ostomy placed on patient

## 2023-08-21 NOTE — ED Notes (Signed)
 Patient screaming at staff for something to drink. RN informed patient that the provider has to see him first

## 2023-08-21 NOTE — ED Notes (Signed)
 NT attempted EKG on pt pt would not sit still to obtain the EKG.

## 2023-08-21 NOTE — ED Notes (Signed)
 Attempted to call facility x2 for discharge information

## 2023-09-02 ENCOUNTER — Inpatient Hospital Stay (HOSPITAL_COMMUNITY)
Admission: EM | Admit: 2023-09-02 | Discharge: 2023-09-08 | DRG: 091 | Disposition: A | Discharge status: Skilled Nursing Facility | Attending: Internal Medicine | Admitting: Internal Medicine

## 2023-09-02 ENCOUNTER — Encounter (HOSPITAL_COMMUNITY): Payer: Self-pay

## 2023-09-02 ENCOUNTER — Encounter: Admitting: Orthopedic Surgery

## 2023-09-02 ENCOUNTER — Emergency Department (HOSPITAL_COMMUNITY)

## 2023-09-02 DIAGNOSIS — I70245 Atherosclerosis of native arteries of left leg with ulceration of other part of foot: Secondary | ICD-10-CM | POA: Diagnosis present

## 2023-09-02 DIAGNOSIS — F32A Depression, unspecified: Secondary | ICD-10-CM | POA: Diagnosis present

## 2023-09-02 DIAGNOSIS — N401 Enlarged prostate with lower urinary tract symptoms: Secondary | ICD-10-CM | POA: Diagnosis present

## 2023-09-02 DIAGNOSIS — Z89412 Acquired absence of left great toe: Secondary | ICD-10-CM

## 2023-09-02 DIAGNOSIS — F17210 Nicotine dependence, cigarettes, uncomplicated: Secondary | ICD-10-CM | POA: Diagnosis present

## 2023-09-02 DIAGNOSIS — Z8673 Personal history of transient ischemic attack (TIA), and cerebral infarction without residual deficits: Secondary | ICD-10-CM

## 2023-09-02 DIAGNOSIS — E1151 Type 2 diabetes mellitus with diabetic peripheral angiopathy without gangrene: Secondary | ICD-10-CM | POA: Diagnosis present

## 2023-09-02 DIAGNOSIS — E876 Hypokalemia: Secondary | ICD-10-CM | POA: Diagnosis present

## 2023-09-02 DIAGNOSIS — E785 Hyperlipidemia, unspecified: Secondary | ICD-10-CM | POA: Diagnosis present

## 2023-09-02 DIAGNOSIS — Z79899 Other long term (current) drug therapy: Secondary | ICD-10-CM

## 2023-09-02 DIAGNOSIS — D631 Anemia in chronic kidney disease: Secondary | ICD-10-CM | POA: Diagnosis present

## 2023-09-02 DIAGNOSIS — S78111A Complete traumatic amputation at level between right hip and knee, initial encounter: Secondary | ICD-10-CM

## 2023-09-02 DIAGNOSIS — Y838 Other surgical procedures as the cause of abnormal reaction of the patient, or of later complication, without mention of misadventure at the time of the procedure: Secondary | ICD-10-CM | POA: Diagnosis present

## 2023-09-02 DIAGNOSIS — E871 Hypo-osmolality and hyponatremia: Principal | ICD-10-CM | POA: Diagnosis present

## 2023-09-02 DIAGNOSIS — Z8042 Family history of malignant neoplasm of prostate: Secondary | ICD-10-CM

## 2023-09-02 DIAGNOSIS — G894 Chronic pain syndrome: Secondary | ICD-10-CM | POA: Diagnosis present

## 2023-09-02 DIAGNOSIS — K9419 Other complications of enterostomy: Secondary | ICD-10-CM | POA: Diagnosis present

## 2023-09-02 DIAGNOSIS — Z8249 Family history of ischemic heart disease and other diseases of the circulatory system: Secondary | ICD-10-CM

## 2023-09-02 DIAGNOSIS — I130 Hypertensive heart and chronic kidney disease with heart failure and stage 1 through stage 4 chronic kidney disease, or unspecified chronic kidney disease: Secondary | ICD-10-CM | POA: Diagnosis present

## 2023-09-02 DIAGNOSIS — R339 Retention of urine, unspecified: Secondary | ICD-10-CM | POA: Diagnosis present

## 2023-09-02 DIAGNOSIS — G546 Phantom limb syndrome with pain: Secondary | ICD-10-CM | POA: Diagnosis present

## 2023-09-02 DIAGNOSIS — Z85820 Personal history of malignant melanoma of skin: Secondary | ICD-10-CM

## 2023-09-02 DIAGNOSIS — G928 Other toxic encephalopathy: Principal | ICD-10-CM | POA: Diagnosis present

## 2023-09-02 DIAGNOSIS — L97528 Non-pressure chronic ulcer of other part of left foot with other specified severity: Secondary | ICD-10-CM | POA: Diagnosis present

## 2023-09-02 DIAGNOSIS — S98132A Complete traumatic amputation of one left lesser toe, initial encounter: Secondary | ICD-10-CM

## 2023-09-02 DIAGNOSIS — Z794 Long term (current) use of insulin: Secondary | ICD-10-CM

## 2023-09-02 DIAGNOSIS — Z833 Family history of diabetes mellitus: Secondary | ICD-10-CM

## 2023-09-02 DIAGNOSIS — R338 Other retention of urine: Secondary | ICD-10-CM | POA: Diagnosis present

## 2023-09-02 DIAGNOSIS — Z85828 Personal history of other malignant neoplasm of skin: Secondary | ICD-10-CM

## 2023-09-02 DIAGNOSIS — E861 Hypovolemia: Secondary | ICD-10-CM | POA: Diagnosis present

## 2023-09-02 DIAGNOSIS — E875 Hyperkalemia: Secondary | ICD-10-CM | POA: Diagnosis present

## 2023-09-02 DIAGNOSIS — R451 Restlessness and agitation: Secondary | ICD-10-CM | POA: Diagnosis present

## 2023-09-02 DIAGNOSIS — J449 Chronic obstructive pulmonary disease, unspecified: Secondary | ICD-10-CM | POA: Diagnosis present

## 2023-09-02 DIAGNOSIS — Z91040 Latex allergy status: Secondary | ICD-10-CM

## 2023-09-02 DIAGNOSIS — Z89611 Acquired absence of right leg above knee: Secondary | ICD-10-CM

## 2023-09-02 DIAGNOSIS — Z8616 Personal history of COVID-19: Secondary | ICD-10-CM

## 2023-09-02 DIAGNOSIS — M549 Dorsalgia, unspecified: Secondary | ICD-10-CM | POA: Diagnosis present

## 2023-09-02 DIAGNOSIS — N1832 Chronic kidney disease, stage 3b: Secondary | ICD-10-CM | POA: Diagnosis present

## 2023-09-02 DIAGNOSIS — Z7982 Long term (current) use of aspirin: Secondary | ICD-10-CM

## 2023-09-02 DIAGNOSIS — E119 Type 2 diabetes mellitus without complications: Secondary | ICD-10-CM

## 2023-09-02 DIAGNOSIS — G9009 Other idiopathic peripheral autonomic neuropathy: Secondary | ICD-10-CM | POA: Diagnosis present

## 2023-09-02 DIAGNOSIS — N319 Neuromuscular dysfunction of bladder, unspecified: Secondary | ICD-10-CM | POA: Diagnosis present

## 2023-09-02 DIAGNOSIS — K219 Gastro-esophageal reflux disease without esophagitis: Secondary | ICD-10-CM | POA: Diagnosis present

## 2023-09-02 DIAGNOSIS — G9341 Metabolic encephalopathy: Secondary | ICD-10-CM | POA: Diagnosis not present

## 2023-09-02 DIAGNOSIS — Y733 Surgical instruments, materials and gastroenterology and urology devices (including sutures) associated with adverse incidents: Secondary | ICD-10-CM | POA: Diagnosis present

## 2023-09-02 DIAGNOSIS — E1165 Type 2 diabetes mellitus with hyperglycemia: Secondary | ICD-10-CM | POA: Diagnosis present

## 2023-09-02 DIAGNOSIS — Z809 Family history of malignant neoplasm, unspecified: Secondary | ICD-10-CM

## 2023-09-02 DIAGNOSIS — R410 Disorientation, unspecified: Secondary | ICD-10-CM

## 2023-09-02 DIAGNOSIS — E1122 Type 2 diabetes mellitus with diabetic chronic kidney disease: Secondary | ICD-10-CM | POA: Diagnosis present

## 2023-09-02 LAB — CBC WITH DIFFERENTIAL/PLATELET
Abs Immature Granulocytes: 0.03 10*3/uL (ref 0.00–0.07)
Basophils Absolute: 0 10*3/uL (ref 0.0–0.1)
Basophils Relative: 0 %
Eosinophils Absolute: 0.6 10*3/uL — ABNORMAL HIGH (ref 0.0–0.5)
Eosinophils Relative: 7 %
HCT: 24.7 % — ABNORMAL LOW (ref 39.0–52.0)
Hemoglobin: 8.2 g/dL — ABNORMAL LOW (ref 13.0–17.0)
Immature Granulocytes: 0 %
Lymphocytes Relative: 17 %
Lymphs Abs: 1.3 10*3/uL (ref 0.7–4.0)
MCH: 30.5 pg (ref 26.0–34.0)
MCHC: 33.2 g/dL (ref 30.0–36.0)
MCV: 91.8 fL (ref 80.0–100.0)
Monocytes Absolute: 0.4 10*3/uL (ref 0.1–1.0)
Monocytes Relative: 5 %
Neutro Abs: 5.4 10*3/uL (ref 1.7–7.7)
Neutrophils Relative %: 71 %
Platelets: 258 10*3/uL (ref 150–400)
RBC: 2.69 MIL/uL — ABNORMAL LOW (ref 4.22–5.81)
RDW: 16 % — ABNORMAL HIGH (ref 11.5–15.5)
WBC: 7.8 10*3/uL (ref 4.0–10.5)
nRBC: 0 % (ref 0.0–0.2)

## 2023-09-02 LAB — COMPREHENSIVE METABOLIC PANEL
ALT: 10 U/L (ref 0–44)
AST: 15 U/L (ref 15–41)
Albumin: 2.5 g/dL — ABNORMAL LOW (ref 3.5–5.0)
Alkaline Phosphatase: 81 U/L (ref 38–126)
Anion gap: 7 (ref 5–15)
BUN: 14 mg/dL (ref 8–23)
CO2: 21 mmol/L — ABNORMAL LOW (ref 22–32)
Calcium: 8.1 mg/dL — ABNORMAL LOW (ref 8.9–10.3)
Chloride: 95 mmol/L — ABNORMAL LOW (ref 98–111)
Creatinine, Ser: 1.32 mg/dL — ABNORMAL HIGH (ref 0.61–1.24)
GFR, Estimated: >60 mL/min (ref >60)
Glucose, Bld: 119 mg/dL — ABNORMAL HIGH (ref 70–99)
Potassium: 5.7 mmol/L — ABNORMAL HIGH (ref 3.5–5.1)
Sodium: 123 mmol/L — ABNORMAL LOW (ref 135–145)
Total Bilirubin: 0.7 mg/dL (ref 0.0–1.2)
Total Protein: 5.9 g/dL — ABNORMAL LOW (ref 6.5–8.1)

## 2023-09-02 LAB — CBG MONITORING, ED
Glucose-Capillary: 120 mg/dL — ABNORMAL HIGH (ref 70–99)
Glucose-Capillary: 96 mg/dL (ref 70–99)
Glucose-Capillary: 89 mg/dL (ref 70–99)

## 2023-09-02 LAB — I-STAT VENOUS BLOOD GAS, ED
Acid-base deficit: 4 mmol/L — ABNORMAL HIGH (ref 0.0–2.0)
Bicarbonate: 22.2 mmol/L (ref 20.0–28.0)
Calcium, Ion: 1.18 mmol/L (ref 1.15–1.40)
HCT: 25 % — ABNORMAL LOW (ref 39.0–52.0)
Hemoglobin: 8.5 g/dL — ABNORMAL LOW (ref 13.0–17.0)
O2 Saturation: 61 %
Potassium: 5.5 mmol/L — ABNORMAL HIGH (ref 3.5–5.1)
Sodium: 124 mmol/L — ABNORMAL LOW (ref 135–145)
TCO2: 24 mmol/L (ref 22–32)
pCO2, Ven: 46.2 mmHg (ref 44–60)
pH, Ven: 7.29 (ref 7.25–7.43)
pO2, Ven: 35 mmHg (ref 32–45)

## 2023-09-02 LAB — URINALYSIS, ROUTINE W REFLEX MICROSCOPIC
Bacteria, UA: NONE SEEN
Bilirubin Urine: NEGATIVE
Glucose, UA: NEGATIVE mg/dL
Hgb urine dipstick: NEGATIVE
Ketones, ur: NEGATIVE mg/dL
Leukocytes,Ua: NEGATIVE
Nitrite: NEGATIVE
Protein, ur: 30 mg/dL — AB
Specific Gravity, Urine: 1.012 (ref 1.005–1.030)
pH: 5 (ref 5.0–8.0)

## 2023-09-02 LAB — RAPID URINE DRUG SCREEN, HOSP PERFORMED
Amphetamines: NOT DETECTED
Barbiturates: NOT DETECTED
Benzodiazepines: NOT DETECTED
Cocaine: NOT DETECTED
Opiates: NOT DETECTED
Tetrahydrocannabinol: POSITIVE — AB

## 2023-09-02 LAB — BASIC METABOLIC PANEL
Anion gap: 5 (ref 5–15)
BUN: 13 mg/dL (ref 8–23)
CO2: 23 mmol/L (ref 22–32)
Calcium: 8.2 mg/dL — ABNORMAL LOW (ref 8.9–10.3)
Chloride: 96 mmol/L — ABNORMAL LOW (ref 98–111)
Creatinine, Ser: 1.35 mg/dL — ABNORMAL HIGH (ref 0.61–1.24)
GFR, Estimated: 59 mL/min — ABNORMAL LOW (ref >60)
Glucose, Bld: 103 mg/dL — ABNORMAL HIGH (ref 70–99)
Potassium: 5.8 mmol/L — ABNORMAL HIGH (ref 3.5–5.1)
Sodium: 124 mmol/L — ABNORMAL LOW (ref 135–145)

## 2023-09-02 LAB — OSMOLALITY: Osmolality: 271 mosm/kg — ABNORMAL LOW (ref 275–295)

## 2023-09-02 LAB — I-STAT CG4 LACTIC ACID, ED: Lactic Acid, Venous: 0.4 mmol/L — ABNORMAL LOW (ref 0.5–1.9)

## 2023-09-02 LAB — AMMONIA: Ammonia: 33 umol/L (ref 9–35)

## 2023-09-02 LAB — ETHANOL: Alcohol, Ethyl (B): <10 mg/dL (ref <10)

## 2023-09-02 MED ORDER — PREGABALIN 75 MG PO CAPS
150.0000 mg | ORAL_CAPSULE | Freq: Two times a day (BID) | ORAL | Status: DC
  Administered 2023-09-02 – 2023-09-08 (×12): 150 mg via ORAL
  Filled 2023-09-02 (×12): qty 2

## 2023-09-02 MED ORDER — SODIUM ZIRCONIUM CYCLOSILICATE 5 G PO PACK
5.0000 g | PACK | Freq: Once | ORAL | Status: DC
  Filled 2023-09-02: qty 1

## 2023-09-02 MED ORDER — INSULIN GLARGINE 100 UNIT/ML ~~LOC~~ SOLN: 15.0000 [IU] | Freq: Every day | SUBCUTANEOUS | Status: DC

## 2023-09-02 MED ORDER — SODIUM CHLORIDE 0.9% FLUSH
3.0000 mL | Freq: Two times a day (BID) | INTRAVENOUS | Status: DC
  Administered 2023-09-04 – 2023-09-08 (×8): 3 mL via INTRAVENOUS

## 2023-09-02 MED ORDER — HALOPERIDOL LACTATE 5 MG/ML IJ SOLN
2.0000 mg | Freq: Once | INTRAMUSCULAR | Status: AC | PRN
  Administered 2023-09-02: 2 mg via INTRAVENOUS
  Filled 2023-09-02: qty 1

## 2023-09-02 MED ORDER — SODIUM ZIRCONIUM CYCLOSILICATE 10 G PO PACK
10.0000 g | PACK | Freq: Once | ORAL | Status: DC
  Filled 2023-09-02: qty 1

## 2023-09-02 MED ORDER — FERROUS SULFATE 325 (65 FE) MG PO TABS
325.0000 mg | ORAL_TABLET | Freq: Three times a day (TID) | ORAL | Status: DC
  Administered 2023-09-03 – 2023-09-08 (×16): 325 mg via ORAL
  Filled 2023-09-02 (×16): qty 1

## 2023-09-02 MED ORDER — IPRATROPIUM-ALBUTEROL 0.5-2.5 (3) MG/3ML IN SOLN
3.0000 mL | Freq: Four times a day (QID) | RESPIRATORY_TRACT | Status: DC | PRN
  Administered 2023-09-03: 3 mL via RESPIRATORY_TRACT

## 2023-09-02 MED ORDER — SODIUM CHLORIDE 0.9 % IV SOLN: INTRAVENOUS | Status: DC

## 2023-09-02 MED ORDER — DOCUSATE SODIUM 100 MG PO CAPS: 100.0000 mg | ORAL_CAPSULE | Freq: Two times a day (BID) | ORAL | Status: DC | PRN

## 2023-09-02 MED ORDER — OXYCODONE HCL ER 15 MG PO T12A
15.0000 mg | EXTENDED_RELEASE_TABLET | Freq: Once | ORAL | Status: AC | PRN
  Administered 2023-09-02: 15 mg via ORAL
  Filled 2023-09-02: qty 1

## 2023-09-02 MED ORDER — INSULIN ASPART 100 UNIT/ML IJ SOLN
0.0000 [IU] | INTRAMUSCULAR | Status: DC
  Administered 2023-09-03 – 2023-09-07 (×9): 2 [IU] via SUBCUTANEOUS
  Administered 2023-09-08: 3 [IU] via SUBCUTANEOUS
  Administered 2023-09-08 (×2): 2 [IU] via SUBCUTANEOUS

## 2023-09-02 MED ORDER — ATORVASTATIN CALCIUM 80 MG PO TABS
80.0000 mg | ORAL_TABLET | Freq: Every day | ORAL | Status: DC
  Administered 2023-09-03 – 2023-09-08 (×6): 80 mg via ORAL
  Filled 2023-09-02 (×5): qty 1
  Filled 2023-09-02: qty 2

## 2023-09-02 MED ORDER — VITAMIN D 25 MCG (1000 UNIT) PO TABS
2000.0000 [IU] | ORAL_TABLET | Freq: Every day | ORAL | Status: DC
  Administered 2023-09-03 – 2023-09-08 (×6): 2000 [IU] via ORAL
  Filled 2023-09-02 (×6): qty 2

## 2023-09-02 MED ORDER — IPRATROPIUM-ALBUTEROL 0.5-2.5 (3) MG/3ML IN SOLN
3.0000 mL | Freq: Four times a day (QID) | RESPIRATORY_TRACT | Status: DC | PRN
  Filled 2023-09-02: qty 3

## 2023-09-02 MED ORDER — ESCITALOPRAM OXALATE 20 MG PO TABS
20.0000 mg | ORAL_TABLET | Freq: Every day | ORAL | Status: DC
  Administered 2023-09-03 – 2023-09-08 (×6): 20 mg via ORAL
  Filled 2023-09-02 (×3): qty 1
  Filled 2023-09-02: qty 2
  Filled 2023-09-02 (×2): qty 1

## 2023-09-02 MED ORDER — ENOXAPARIN SODIUM 40 MG/0.4ML IJ SOSY
40.0000 mg | PREFILLED_SYRINGE | INTRAMUSCULAR | Status: DC
  Administered 2023-09-02 – 2023-09-03 (×2): 40 mg via SUBCUTANEOUS
  Filled 2023-09-02 (×2): qty 0.4

## 2023-09-02 MED ORDER — TAMSULOSIN HCL 0.4 MG PO CAPS
0.4000 mg | ORAL_CAPSULE | Freq: Every day | ORAL | Status: DC
  Administered 2023-09-03 – 2023-09-08 (×6): 0.4 mg via ORAL
  Filled 2023-09-02 (×6): qty 1

## 2023-09-02 MED ORDER — SALINE SPRAY 0.65 % NA SOLN
1.0000 | Freq: Once | NASAL | Status: AC
  Administered 2023-09-02: 1 via NASAL
  Filled 2023-09-02: qty 44

## 2023-09-02 MED ORDER — NALOXONE HCL 0.4 MG/ML IJ SOLN: 0.4000 mg | INTRAMUSCULAR | Status: DC | PRN

## 2023-09-02 MED ORDER — ACETAMINOPHEN 325 MG PO TABS
650.0000 mg | ORAL_TABLET | Freq: Four times a day (QID) | ORAL | Status: DC | PRN
  Administered 2023-09-02 – 2023-09-08 (×7): 650 mg via ORAL
  Filled 2023-09-02 (×7): qty 2

## 2023-09-02 MED ORDER — LACTULOSE 10 GM/15ML PO SOLN
20.0000 g | Freq: Two times a day (BID) | ORAL | Status: DC
  Administered 2023-09-03 – 2023-09-08 (×7): 20 g via ORAL
  Filled 2023-09-02 (×9): qty 30

## 2023-09-02 MED ORDER — OXCARBAZEPINE 150 MG PO TABS
150.0000 mg | ORAL_TABLET | Freq: Two times a day (BID) | ORAL | Status: DC
  Administered 2023-09-02 – 2023-09-08 (×12): 150 mg via ORAL
  Filled 2023-09-02 (×13): qty 1

## 2023-09-02 MED ORDER — ASPIRIN 81 MG PO TBEC
81.0000 mg | DELAYED_RELEASE_TABLET | Freq: Every day | ORAL | Status: DC
  Administered 2023-09-02 – 2023-09-08 (×7): 81 mg via ORAL
  Filled 2023-09-02 (×7): qty 1

## 2023-09-02 MED ORDER — ACETAMINOPHEN 650 MG RE SUPP: 650.0000 mg | Freq: Four times a day (QID) | RECTAL | Status: DC | PRN

## 2023-09-02 NOTE — ED Notes (Signed)
 Bed alarm placed under the pt

## 2023-09-02 NOTE — ED Notes (Signed)
 Attempted to get VS on the pt. Pt continues to remove monitors and refusing to allow staff to assist him. Pt also attempting to rip IV out. Bed alarm still under pt.

## 2023-09-02 NOTE — ED Notes (Signed)
 Pt awake and yelling nurse out of room. Attempting to get out of bed. This RN at bedside. Pt wanting water, a urinal, an inhaler, and a tv remote. MD notified of request for inhaler. All other needs met. Pt redirected to relax in bed with tv on. Warm blankets provided.

## 2023-09-02 NOTE — ED Notes (Signed)
 Pt continues to yell at staff being verbally aggressive, throwing things, ripping off monitors, and attempting to get out of the bed. Provider made aware.

## 2023-09-02 NOTE — ED Notes (Signed)
 Patient transported to CT

## 2023-09-02 NOTE — Consult Note (Signed)
 Gregory [name] [birth year]  [id number].    Requesting MD: Jacqulyn Bath, MD Chief Complaint/Reason for Consult: prolapsed ileostomy   HPI:  63 y/o M with MMP listed below who presented to the ED from the SNF where he resides due to AMS. AMS limits my ability to gather history today so history was gathered from the medical chart and medical providers seeing the patient today. ED workup significant for hyponatremia, hypokalemia and patient is being admitted to the medical service for further management. CT head negative for acute abnormality. 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 General surgery is asked to evaluate his ostomy. Patient is unable to comment on his stoma or ostomy production at this time.  Per chart review the patient is not on any blood thinners other than a baby ASA  His chart mentions he is a current cigarette smoker and his UDS was positive for THC.    ROS: Review of Systems  All other systems reviewed and are negative.   Family History  Problem Relation Age of Onset   Diabetes Mother    Heart disease Mother    Cancer Mother    Cancer Father    Bone cancer Father    Prostate cancer Father    Cancer Paternal Grandfather    Cancer Other    Diabetes Other     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    Chronic pain syndrome    COPD (chronic obstructive pulmonary disease) (HCC)    DDD  (degenerative disc disease), lumbar    Depression    major depressive disorder    Diabetes mellitus without complication (HCC)    type 2    Diabetic ulcer of toe of left foot (HCC) 10/02/2016   Difficult intubation    Difficulty in walking, not elsewhere classified    Epidural abscess 10/02/2016   Foot drop, bilateral    Foot drop, right 12/27/2015   GERD (gastroesophageal reflux disease)    Herpesviral vesicular dermatitis    History of COVID-19 03/15/2022   Hyperlipidemia    Hyperlipidemia    Hypertension    Insomnia    Malignant melanoma of other parts of face (HCC)    Melanoma (HCC)    MRSA bacteremia    Muscle weakness (generalized)    Nasal congestion    Necrosis of toe (HCC) 07/08/2018   Neuromuscular dysfunction of bladder    Osteomyelitis (HCC)    Osteomyelitis (HCC)    Osteomyelitis (HCC)    right BKA   Other idiopathic peripheral autonomic neuropathy    Retention of urine, unspecified    Squamous cell carcinoma of skin    Stroke (HCC)    Unsteadiness on feet    Urinary retention    Urinary retention    Urinary tract infection  Wears dentures    Wears glasses     Past Surgical History:  Procedure Laterality Date   AMPUTATION Left 03/29/2017   Procedure: LEFT GREAT TOE AMPUTATION AT METATARSOPHALANGEAL JOINT;  Surgeon: Nadara Mustard, MD;  Location[facility] OR;  Service: Orthopedics;  Laterality: Left;   AMPUTATION Right 03/29/2017   Procedure: RIGHT BELOW KNEE AMPUTATION;  Surgeon: Nadara Mustard, MD;  Location: [facility] OR;  Service: Orthopedics;  Laterality: Right;   AMPUTATION Left 06/06/2018   Procedure: LEFT 2ND TOE AMPUTATION;  Surgeon: Nadara Mustard, MD;  Location: [facility] OR;  Service: Orthopedics;  Laterality: Left;   AMPUTATION Right 11/19/2018   Procedure: AMPUTATION ABOVE KNEE;  Surgeon: Nadara Mustard, MD;  Location: [facility] OR;  Service: Orthopedics;  Laterality: Right;   ANTERIOR CERVICAL CORPECTOMY N/A 11/25/2015   Procedure: ANTERIOR CERVICAL FIVE CORPECTOMY  Cervical four - six fusion;  Surgeon: Lisbeth Renshaw, MD;  Location: [facility];  Service: Neurosurgery;  Laterality: N/A;  ANTERIOR CERVICAL FIVE CORPECTOMY Cervical four - six fusion   APPLICATION OF WOUND VAC Right 10/22/2018   Procedure: Application Of Wound Vac;  Surgeon: Nadara Mustard, MD;  Location: [facility] OR;  Service: Orthopedics;  Laterality: Right;   CYSTOSCOPY WITH BIOPSY N/A 05/01/2022   Procedure: CYSTOSCOPY WITH URETHRAL BIOPSY;  Surgeon: Noel Christmas, MD;  Location: [facility] ORS;  Service: Urology;  Laterality: N/A;  1 HR   CYSTOSCOPY WITH RETROGRADE URETHROGRAM N/A 04/25/2018   Procedure: CYSTOSCOPY WITH RETROGRADE URETHROGRAM/ BALLOON DILATION;  Surgeon: Ihor Gully, MD;  Location: [facility] ORS;  Service: Urology;  Laterality: N/A;   CYSTOSCOPY WITH URETHRAL DILATATION N/A 05/01/2022   Procedure: BALLOON DILATION WITH  OPTILUME;  Surgeon: Noel Christmas, MD;  Location: [facility] ORS;  Service: Urology;  Laterality: N/A;   LAPAROTOMY N/A 12/03/2022   Procedure: EXPLORATORY LAPAROTOMY  total abdominal colectomy with end ileostomy;  Surgeon: Kinsinger, De Blanch, MD;  Location: [facility] OR;  Service: General;  Laterality: N/A;   MULTIPLE TOOTH EXTRACTIONS     POSTERIOR CERVICAL FUSION/FORAMINOTOMY N/A 11/29/2015   Procedure: Cervical Three-Cervical Seven Posterior Cervical Laminectomy with Fusion;  Surgeon: Lisbeth Renshaw, MD;  Location: [facility];  Service: Neurosurgery;  Laterality: N/A;  Cervical Three-Cervical Seven Posterior Cervical Laminectomy with Fusion   STUMP REVISION Right 10/22/2018   Procedure: REVISION RIGHT BELOW KNEE AMPUTATION;  Surgeon: Nadara Mustard, MD;  Location: [facility] OR;  Service: Orthopedics;  Laterality: Right;   STUMP REVISION Right 12/05/2018   Procedure: Revision Right Above Knee Amputation;  Surgeon: Nadara Mustard, MD;  Location: [facility] OR;  Service: Orthopedics;  Laterality: Right;   STUMP REVISION Right 05/29/2019   Procedure: REVISION RIGHT ABOVE KNEE AMPUTATION;   Surgeon: Nadara Mustard, MD;  Location: [facility] OR;  Service: Orthopedics;  Laterality: Right;   STUMP REVISION Right 06/24/2019   Procedure: STUMP REVISION;  Surgeon: Nadara Mustard, MD;  Location: [facility] OR;  Service: Orthopedics;  Laterality: Right;    Social History:  reports that he has been smoking cigarettes. He has never used smokeless tobacco. He reports that he does not drink alcohol and does not use drugs.  Allergies:  Allergies  Allergen Reactions   Latex Other (See Comments)    Unknown reaction    (Not in a hospital admission)    Physical Exam: Blood pressure (!) 147/77, pulse 62, temperature 98.3 F (36.8 C), temperature source Oral, resp. rate 13, SpO2 97%. General: chronically ill appear white male in no acute distress HEENT:  head -normocephalic, atraumatic; Eyes: anicteric sclerae,  Neck- Trachea is midline CV- RRR, normal S1/S2, no M/R/G, no upper extremity edema  Pulm- breathing is non-labored Abd- soft, protuberant, RUQ ileostomy mild ileostomy prolapse productive of non-bloody, soft stool. Stoma is not edematous. It was easily reduced when digitized.  GU- deferred  MSK- UE symmetrical, s/p R AKA Neuro- nonfocal exam, patient arousing to voice and saying occasional words but quickly falling back asleep.  Psych- unable to assess due to somnolence Skin: warm and dry, no rashes or lesions   Results for orders placed or performed during the hospital encounter of 09/02/23 (from the past 48 hours)  CBG monitoring, ED     Status: Abnormal   Collection Time: 09/02/23 10:45 AM  Result Value Ref Range   Glucose-Capillary 120 (H) 70 - 99 mg/dL    Comment: Glucose reference range applies only to samples taken after fasting for at least 8 hours.  Comprehensive metabolic panel     Status: Abnormal   Collection Time: 09/02/23 11:03 AM  Result Value Ref Range   Sodium 123 (L) 135 - 145 mmol/L   Potassium 5.7 (H) 3.5 - 5.1 mmol/L   Chloride 95 (L) 98 - 111 mmol/L   CO2 21 (L) 22  - 32 mmol/L   Glucose, Bld 119 (H) 70 - 99 mg/dL    Comment: Glucose reference range applies only to samples taken after fasting for at least 8 hours.   BUN 14 8 - 23 mg/dL   Creatinine, Ser 2.95 (H) 0.61 - 1.24 mg/dL   Calcium 8.1 (L) 8.9 - 10.3 mg/dL   Total Protein 5.9 (L) 6.5 - 8.1 g/dL   Albumin 2.5 (L) 3.5 - 5.0 g/dL   AST 15 15 - 41 U/L   ALT 10 0 - 44 U/L   Alkaline Phosphatase 81 38 - 126 U/L   Total Bilirubin 0.7 0.0 - 1.2 mg/dL   GFR, Estimated >62 >13 mL/min    Comment: (NOTE) Calculated using the CKD-EPI Creatinine Equation (2021)    Anion gap 7 5 - 15    Comment: Performed at [facility], [address] [place], [place] [address]  CBC with Differential/Platelet     Status: Abnormal   Collection Time: 09/02/23 11:03 AM  Result Value Ref Range   WBC 7.8 4.0 - 10.5 K/uL   RBC 2.69 (L) 4.22 - 5.81 MIL/uL   Hemoglobin 8.2 (L) 13.0 - 17.0 g/dL   HCT 84.6 (L) 96.2 - 95.2 %   MCV 91.8 80.0 - 100.0 fL   MCH 30.5 26.0 - 34.0 pg   MCHC 33.2 30.0 - 36.0 g/dL   RDW 84.1 (H) 32.4 - 40.1 %   Platelets 258 150 - 400 K/uL   nRBC 0.0 0.0 - 0.2 %   Neutrophils Relative % 71 %   Neutro Abs 5.4 1.7 - 7.7 K/uL   Lymphocytes Relative 17 %   Lymphs Abs 1.3 0.7 - 4.0 K/uL   Monocytes Relative 5 %   Monocytes Absolute 0.4 0.1 - 1.0 K/uL   Eosinophils Relative 7 %   Eosinophils Absolute 0.6 (H) 0.0 - 0.5 K/uL   Basophils Relative 0 %   Basophils Absolute 0.0 0.0 - 0.1 K/uL   Immature Granulocytes 0 %   Abs Immature Granulocytes 0.03 0.00 - 0.07 K/uL    Comment: Performed at [facility], [address], [place], [place] [address]  Urinalysis, Routine w reflex microscopic -Urine, Clean Catch  Status: Abnormal   Collection Time: 09/02/23 11:03 AM  Result Value Ref Range   Color, Urine GREEN (A) YELLOW   APPearance CLEAR CLEAR   Specific Gravity, Urine 1.012 1.005 - 1.030   pH 5.0 5.0 - 8.0   Glucose, UA NEGATIVE NEGATIVE mg/dL   Hgb urine dipstick NEGATIVE  NEGATIVE   Bilirubin Urine NEGATIVE NEGATIVE   Ketones, ur NEGATIVE NEGATIVE mg/dL   Protein, ur 30 (A) NEGATIVE mg/dL   Nitrite NEGATIVE NEGATIVE   Leukocytes,Ua NEGATIVE NEGATIVE   RBC / HPF 0-5 0 - 5 RBC/hpf   WBC, UA 0-5 0 - 5 WBC/hpf   Bacteria, UA NONE SEEN NONE SEEN   Squamous Epithelial / HPF 0-5 0 - 5 /HPF   Mucus PRESENT     Comment: Performed at [facility], [address] [place], [place] [address]  Ammonia     Status: None   Collection Time: 09/02/23 11:03 AM  Result Value Ref Range   Ammonia 33 9 - 35 umol/L    Comment: Performed at [facility], [address], [place], [place] [address]  Rapid urine drug screen (hospital performed)     Status: Abnormal   Collection Time: 09/02/23 11:04 AM  Result Value Ref Range   Opiates NONE DETECTED NONE DETECTED   Cocaine NONE DETECTED NONE DETECTED   Benzodiazepines NONE DETECTED NONE DETECTED   Amphetamines NONE DETECTED NONE DETECTED   Tetrahydrocannabinol POSITIVE (A) NONE DETECTED   Barbiturates NONE DETECTED NONE DETECTED    Comment: (NOTE) DRUG SCREEN FOR MEDICAL PURPOSES ONLY.  IF CONFIRMATION IS NEEDED FOR ANY PURPOSE, NOTIFY LAB WITHIN 5 DAYS.  LOWEST DETECTABLE LIMITS FOR URINE DRUG SCREEN Drug Class                     Cutoff (ng/mL) Amphetamine and metabolites    1000 Barbiturate and metabolites    200 Benzodiazepine                 200 Opiates and metabolites        300 Cocaine and metabolites        300 THC                            50 Performed at [facility], [address], [place], [place] [address]   Ethanol     Status: None   Collection Time: 09/02/23 11:04 AM  Result Value Ref Range   Alcohol, Ethyl (B) <10 <10 mg/dL    Comment: (NOTE) Lowest detectable limit for serum alcohol is 10 mg/dL.  For medical purposes only. Performed at [facility], [address] [place], [place] [address]   I-Stat Lactic Acid, ED     Status: Abnormal   Collection Time: 09/02/23  11:13 AM  Result Value Ref Range   Lactic Acid, Venous 0.4 (L) 0.5 - 1.9 mmol/L  I-Stat venous blood gas, ED     Status: Abnormal   Collection Time: 09/02/23 11:13 AM  Result Value Ref Range   pH, Ven 7.290 7.25 - 7.43   pCO2, Ven 46.2 44 - 60 mmHg   pO2, Ven 35 32 - 45 mmHg   Bicarbonate 22.2 20.0 - 28.0 mmol/L   TCO2 24 22 - 32 mmol/L   O2 Saturation 61 %   Acid-base deficit 4.0 (H) 0.0 - 2.0 mmol/L   Sodium 124 (L) 135 - 145 mmol/L  Potassium 5.5 (H) 3.5 - 5.1 mmol/L   Calcium, Ion 1.18 1.15 - 1.40 mmol/L   HCT 25.0 (L) 39.0 - 52.0 %   Hemoglobin 8.5 (L) 13.0 - 17.0 g/dL   Sample type VENOUS    Comment NOTIFIED PHYSICIAN    CT HEAD WO CONTRAST Result Date: 09/02/2023 CLINICAL DATA:  Mental status change EXAM: CT HEAD WITHOUT CONTRAST TECHNIQUE: Contiguous axial images were obtained from the base of the skull through the vertex without intravenous contrast. RADIATION DOSE REDUCTION: This exam was performed according to the departmental dose-optimization program which includes automated exposure control, adjustment of the mA and/or kV according to patient size and/or use of iterative reconstruction technique. COMPARISON:  MRI head 06/27/2023, CT head 07/15/2023 FINDINGS: Brain: No acute intracranial hemorrhage. No CT evidence of acute infarct. Nonspecific hypoattenuation in the periventricular and subcortical white matter favored to reflect chronic microvascular ischemic changes. Redemonstrated remote infarct in the right PCA territory no edema, mass effect, or midline shift. The basilar cisterns are patent. Ventricles: Prominence of the ventricles suggesting underlying parenchymal volume loss. Vascular: Atherosclerotic calcifications of the carotid siphons and intracranial vertebral arteries. No hyperdense vessel. Skull: No acute or aggressive finding. Chronic appearing bilateral nasal bone deformities. Orbits: Orbits are symmetric. Sinuses: Mucosal thickening in the ethmoid sinuses and left  maxillary sinus. Other: Small left mastoid effusion. Similar focal soft tissue swelling and surgical clips in the right facial soft tissues. IMPRESSION: No CT evidence of acute intracranial abnormality. Remote right PCA territory infarct. Mild chronic microvascular ischemic changes. Electronically Signed   By: Emily Filbert M.D.   On: 09/02/2023 13:55      Assessment/Plan 63 y/o M who presents with AMS and was also noted to have ileostomy prolapse. No evidence of bowel obstruction or threatened bowel on exam. Using a single finger, through the ostomy bag, it was easy to manually reduce the stoma. It is productive of non-bloody stool. No acute surgical needs. Call as needed.   FEN - ok for a diet once his mental status improves from a CCS standpoint VTE - SCD's, ok for chemical VTE ID - none at present Admit - TRH service   HTN HLD PVD DM2 on insulin COPD PCA stroke 06/2023 Hx R AKA Chronic pain after amputation     I reviewed nursing notes, ED provider notes, hospitalist notes, last 24 h vitals and pain scores, last 48 h intake and output, last 24 h labs and trends, and last 24 h imaging results.  Adam Phenix, [name] [facility] 09/02/2023, 4:14 PM Please see Amion for pager number during day hours 7:00am-4:30pm or 7:00am -11:30am on weekends

## 2023-09-02 NOTE — ED Provider Notes (Signed)
 [name] EMERG[facility]Y DE[facility]MENT AT [facility] Provider Note   CSN: [id number] Arrival date & time: 09/02/23  1036     History  Chief Complaint  Patient presents with   Altered Mental Status    Gregory [name] is a 63 y.o. male.  Patient is a 63 year old male with a past medical history of hypertension, diabetes, chronic pain on chronic opioids, right AKA, COPD, CHF, GERD presenting to the emergency department with altered mental status.  Per EMS the patient was seen with his normal self this morning.  He was sitting in his wheelchair when he was suddenly found slumped over and EMS was called.  EMS did report that family apparently will bring him unknown drugs.  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  The history is provided by the EMS personnel. The history is limited by the condition of the patient.  Altered Mental Status      Home Medications Prior to Admission medications   Medication Sig Start Date End Date Taking? Authorizing Provider  aspirin EC 81 MG tablet Take 81 mg by mouth daily.    [provider]  atorvastatin (LIPITOR) 80 MG tablet Take 1 tablet (80 mg total) by mouth daily. 09/22/22 01/27/24  Jerald Kief, MD  Cholecalciferol 25 MCG (1000 UT) capsule Take 2,000 Units by mouth daily.    [provider]  Cranberry 450 MG TABS Take 450 mg by mouth daily.    [provider]  Dextran 70-Hypromellose (NATURAL BALANCE TEARS) 0.1-0.3 % SOLN Place 1 drop into both eyes every 4 (four) hours as needed (dry eyes).    [provider]  docusate sodium (COLACE) 100 MG capsule Take 100 mg by mouth every 12 (twelve) hours as needed (constipation).    [provider]  escitalopram (LEXAPRO) 20 MG tablet Take 1 tablet (20 mg total) by mouth daily. 08/19/23   Rhetta Mura, MD  famotidine (PEPCID) 20 MG tablet Take 1 tablet (20 mg total) by mouth daily. 07/12/23 08/11/23  Jerald Kief, MD   ferrous sulfate 325 (65 FE) MG tablet Take 1 tablet (325 mg total) by mouth 3 (three) times daily with meals. 01/23/23   Cathren Laine, MD  insulin glargine (LANTUS SOLOSTAR) 100 UNIT/ML Solostar Pen Inject 5 Units into the skin daily. 08/19/23   Rhetta Mura, MD  insulin lispro (HUMALOG) 100 UNIT/ML KwikPen Inject 0-10 Units into the skin See admin instructions. Inject 0-10 units into the skin three times a day and at bedtime, PER SLIDING SCALE:  BGL 151-200 = 2 units 201-250 = 4 units 251-300 = 6 units 301-350 =  8 units 351-400 = 10 units Patient taking differently: Inject 0-10 Units into the skin See admin instructions. Inject 0-10 units into the skin before meals and at bedtime, PER SLIDING SCALE:  0-150 = 0 units 151-200 = 2 units 201-250 = 4 units 251-300 = 6 units 301-350 =  8 units 351-400 = 10 units 01/31/22   Dahal, Melina Schools, MD  ipratropium-albuterol (DUONEB) 0.5-2.5 (3) MG/3ML SOLN Take 3 mLs by nebulization every 6 (six) hours as needed (shortness of breath, wheezing).    [provider]  lactulose (CHRONULAC) 10 GM/15ML solution Take 30 mLs (20 g total) by mouth 2 (two) times daily. 08/19/23   Rhetta Mura, MD  lidocaine 4 % Place 1 patch onto the skin daily. Remove after 12 hours.    [provider]  magnesium oxide (MAG-OX) 400 (240 Mg)  MG tablet Take 400 mg by mouth in the morning, at noon, and at bedtime.    [provider]  Melatonin 10 MG TABS Take 10 mg by mouth at bedtime.    [provider]  Meth-Hyo-M Bl-Na Phos-Ph Sal (URO-MP) 118 MG CAPS Take 118 mg by mouth 2 (two) times daily.    [provider]  Multiple Vitamin (MULTIVITAMIN WITH MINERALS) TABS tablet Take 1 tablet by mouth daily. 12/25/22   Dorcas Carrow, MD  OLANZapine (ZYPREXA) 15 MG tablet Take 1 tablet (15 mg total) by mouth 2 (two) times daily. 08/19/23   Rhetta Mura, MD  omeprazole (PRILOSEC) 20 MG capsule Take 20 mg by mouth at bedtime.     [provider]  OXcarbazepine (TRILEPTAL) 150 MG tablet Take 1 tablet (150 mg total) by mouth 2 (two) times daily. 08/19/23   Rhetta Mura, MD  oxyCODONE (OXYCONTIN) 15 mg 12 hr tablet Take 1 tablet (15 mg total) by mouth in the morning, at noon, and at bedtime. 08/19/23   Rhetta Mura, MD  pregabalin (LYRICA) 150 MG capsule Take 1 capsule (150 mg total) by mouth 2 (two) times daily. 08/19/23   Rhetta Mura, MD  PROTEIN PO Take 30 mLs by mouth 2 (two) times daily. Liquid protein    [provider]  senna (SENOKOT) 8.6 MG TABS tablet Take 2 tablets by mouth at bedtime.    [provider]  sodium bicarbonate 650 MG tablet Take 650 mg by mouth 2 (two) times daily.    [provider]  tamsulosin (FLOMAX) 0.4 MG CAPS capsule Take 1 capsule (0.4 mg total) by mouth daily. 08/19/23   Rhetta Mura, MD      Allergies    Latex    Review of Systems   Review of Systems  Physical Exam Updated Vital Signs BP 115/85   Pulse 70   Temp 97.6 F (36.4 C) (Rectal)   Resp 20   SpO2 98%  Physical Exam Vitals and nursing note reviewed.  Constitutional:      General: He is not in acute distress.    Comments: Drowsy but arousable to noxious stimuli, falls back asleep easily on exam  HENT:     Head: Normocephalic and atraumatic.     Nose: Nose normal.     Mouth/Throat:     Mouth: Mucous membranes are moist.     Comments: Dried secretions around lips Eyes:     Extraocular Movements: Extraocular movements intact.     Conjunctiva/sclera: Conjunctivae normal.     Pupils: Pupils are equal, round, and reactive to light.     Comments: Pupils approximately 2 to 3 mm bilaterally  Cardiovascular:     Rate and Rhythm: Normal rate and regular rhythm.     Heart sounds: Normal heart sounds.  Pulmonary:     Effort: Pulmonary effort is normal.     Breath sounds: Normal breath sounds.  Abdominal:     General: Abdomen is flat.     Palpations: Abdomen is  soft.     Tenderness: There is no abdominal tenderness.     Comments: Appears to have somewhat of a stoma prolapse at ostomy site, skin surrounding ostomy without any erythema or warmth, no significant stool in bag  Musculoskeletal:     Cervical back: Normal range of motion.     Comments: Right lower extremity AKA Left lower extremity with partial toe amputations, chronic appearing nonpitting edema  Skin:    General: Skin is warm and  dry.     Comments: Approximately 1 cm wound to left distal foot without any surrounding erythema, warmth or drainage  Neurological:     Comments: Oriented to person and place, following commands in bilateral upper extremity     ED Results / Procedures / Treatments   Labs (all labs ordered are listed, but only abnormal results are displayed) Labs Reviewed  COMPREHENSIVE METABOLIC PANEL - Abnormal; Notable for the following components:      Result Value   Sodium 123 (*)    Potassium 5.7 (*)    Chloride 95 (*)    CO2 21 (*)    Glucose, Bld 119 (*)    Creatinine, Ser 1.32 (*)    Calcium 8.1 (*)    Total Protein 5.9 (*)    Albumin 2.5 (*)    All other components within normal limits  CBC WITH DIFFERENTIAL/PLATELET - Abnormal; Notable for the following components:   RBC 2.69 (*)    Hemoglobin 8.2 (*)    HCT 24.7 (*)    RDW 16.0 (*)    Eosinophils Absolute 0.6 (*)    All other components within normal limits  CBG MONITORING, ED - Abnormal; Notable for the following components:   Glucose-Capillary 120 (*)    All other components within normal limits  I-STAT CG4 LACTIC ACID, ED - Abnormal; Notable for the following components:   Lactic Acid, Venous 0.4 (*)    All other components within normal limits  I-STAT VENOUS BLOOD GAS, ED - Abnormal; Notable for the following components:   Acid-base deficit 4.0 (*)    Sodium 124 (*)    Potassium 5.5 (*)    HCT 25.0 (*)    Hemoglobin 8.5 (*)    All other components within normal limits  AMMONIA  ETHANOL   URINALYSIS, ROUTINE W REFLEX MICROSCOPIC  RAPID URINE DRUG SCREEN, HOSP PERFORMED    EKG EKG Interpretation Date/Time:  Monday September 02 2023 10:46:31 EDT Ventricular Rate:  72 PR Interval:  172 QRS Duration:  111 QT Interval:  394 QTC Calculation: 432 R Axis:   84  Text Interpretation: Normal sinus rhythm Borderline right axis deviation No significant change since last tracing Confirmed by Elayne Snare ([id number]) on 09/02/2023 11:04:31 AM  Radiology CT HEAD WO CONTRAST Result Date: 09/02/2023 CLINICAL DATA:  Mental status change EXAM: CT HEAD WITHOUT CONTRAST TECHNIQUE: Contiguous axial images were obtained from the base of the skull through the vertex without intravenous contrast. RADIATION DOSE REDUCTION: This exam was performed according to the departmental dose-optimization program which includes automated exposure control, adjustment of the mA and/or kV according to patient size and/or use of iterative reconstruction technique. COMPARISON:  MRI head 06/27/2023, CT head 07/15/2023 FINDINGS: Brain: No acute intracranial hemorrhage. No CT evidence of acute infarct. Nonspecific hypoattenuation in the periventricular and subcortical white matter favored to reflect chronic microvascular ischemic changes. Redemonstrated remote infarct in the right PCA territory no edema, mass effect, or midline shift. The basilar cisterns are patent. Ventricles: Prominence of the ventricles suggesting underlying parenchymal volume loss. Vascular: Atherosclerotic calcifications of the carotid siphons and intracranial vertebral arteries. No hyperdense vessel. Skull: No acute or aggressive finding. Chronic appearing bilateral nasal bone deformities. Orbits: Orbits are symmetric. Sinuses: Mucosal thickening in the ethmoid sinuses and left maxillary sinus. Other: Small left mastoid effusion. Similar focal soft tissue swelling and surgical clips in the right facial soft tissues. IMPRESSION: No CT evidence of acute  intracranial abnormality. Remote right PCA territory infarct. Mild chronic microvascular  ischemic changes. Electronically Signed   By: Emily Filbert M.D.   On: 09/02/2023 13:55    Procedures Procedures    Medications Ordered in ED Medications - No data to display  ED Course/ Medical Decision Making/ A&P Clinical Course as of 09/02/23 1459  Mon Sep 02, 2023  1321 Labs with worsening hyponatremia, mild increased anemia from baseline. CTH pending read, UA pending. [VK]    Clinical Course User Index [VK] Rexford Maus, DO                                 Medical Decision Making This patient presents to the ED with chief complaint(s) of AMS with pertinent past medical history of hypertension, diabetes, chronic pain on chronic opioids, COPD, CHF, GERD which further complicates the presenting complaint. The complaint involves an extensive differential diagnosis and also carries with it a high risk of complications and morbidity.    The differential diagnosis includes intoxication, opioid overdose, arrhythmia, anemia, dehydration, electrolyte abnormality, hypo or hyperglycemia, infection, polypharmacy, ICH, mass effect, CVA, hypercarbia  Additional history obtained: Additional history obtained from EMS  Records reviewed previous admission documents  ED Course and Reassessment: On patient's arrival he is hemodynamically stable in no acute distress, is arousable by noxious stimuli however quickly falls back asleep on exam and is unable to provide much history.  Accu-Chek on arrival was within normal range and EKG on arrival had no acute ischemic changes.  Patient will have labs, urine and head CT imaging to evaluate further for cause of his altered mental status and will be closely reassessed.  Independent labs interpretation:  The following labs were independently interpreted: worsening hyponatremia, mild hyperkalemia, mild anemia  Independent visualization of imaging: - I  independently visualized the following imaging with scope of interpretation limited to determining acute life threatening conditions related to emergency care: CTH, which revealed no acute disease  Consultation: - Consulted or discussed management/test interpretation w/ external professional: hospitalist  Consideration for admission or further workup: patient requires admission for AMS Social Determinants of health: N/A    Amount and/or Complexity of Data Reviewed Labs: ordered. Radiology: ordered.  Risk Decision regarding hospitalization.          Final Clinical Impression(s) / ED Diagnoses Final diagnoses:  Hyponatremia  Disorientation    Rx / DC Orders ED Discharge Orders     None         Rexford Maus, DO 09/02/23 1459

## 2023-09-02 NOTE — H&P (Signed)
 History and Physical    Patient: Gregory [name] ZOX:[id number] DOB: 1961-04-23 DOA: 09/02/2023 DOS: the patient was seen and examined on 09/02/2023 PCP: [name], Provider, MD  Patient coming from: [facility]  Medical readiness/disposition: Anticipate may be ready for discharge by 09/04/2023 pending no complications regarding prolapsed stoma and rapid improvement in mental status/hyponatremia achieved    Chief Complaint:  Chief Complaint  Patient presents with   Altered Mental Status   HPI: Gregory [name] is a 63 y.o. 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  Patient was hospitalizedearly March and early March 2025 with somnolence felt to be multifactorial secondary to polypharmacy and hyponatremia.  Adjustments were made in his psychotropic and narcotic medications with additional recommendations for patient to follow-up with psychiatry to adjust psychotropic medications if somnolence or altered mentation persisted.  Per triage note patient was found 30 minutes prior to arrival slumped over the wheelchair with altered mentation/somnolence.  Apparently right before this the patient was alert and talking.  According to staff patient has a history of using nonprescribed medications from his family.  It is also noted per the nursing facility staff that his ostomy was reported to be protruding more than normal.  Upon arrival his room air sat was 88% and he was placed on 5 L O2 via mask with O2 sats 98%.  Glucose was 174.  Vital signs were otherwise stable.  While in the ER stat CT of the head was obtained that did not demonstrate any new or acute intracranial abnormalities.  Venous ABG was  unremarkable.  Sodium was 123, potassium was 5.7, CO2 was 21 with a normal anion gap, creatinine was 1.32.  Urinalysis was unremarkable.  Alcohol level was <10 and a urine drug screen was positive for THC.  Patient has remained mostly somnolent during his stay here although he has slowly improved in his mentation.  There is a chart notation from several hours ago that stated the patient was awake and yelling at the nurse and he was attempting to get out of the bed.  He reportedly was wanting water, a urinal and an inhaler and a TV remote.  Patient was able to be redirected to bed and no further outburst were noted.  Hospitalist team has been asked to evaluate the patient for admission.   Review of Systems: As mentioned in the history of present illness. All other systems reviewed and are negative. 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    Chronic pain syndrome    COPD (chronic obstructive pulmonary disease) (HCC)    DDD (degenerative disc disease), lumbar    Depression  major depressive disorder    Diabetes mellitus without complication (HCC)    type 2    Diabetic ulcer of toe of left foot (HCC) 10/02/2016   Difficult intubation    Difficulty in walking, not elsewhere classified    Epidural abscess 10/02/2016   Foot drop, bilateral    Foot drop, right 12/27/2015   GERD (gastroesophageal reflux disease)    Herpesviral vesicular dermatitis    History of COVID-19 03/15/2022   Hyperlipidemia     Hyperlipidemia    Hypertension    Insomnia    Malignant melanoma of other parts of face (HCC)    Melanoma (HCC)    MRSA bacteremia    Muscle weakness (generalized)    Nasal congestion    Necrosis of toe (HCC) 07/08/2018   Neuromuscular dysfunction of bladder    Osteomyelitis (HCC)    Osteomyelitis (HCC)    Osteomyelitis (HCC)    right BKA   Other idiopathic peripheral autonomic neuropathy    Retention of urine, unspecified    Squamous cell carcinoma of skin    Stroke (HCC)    Unsteadiness on feet    Urinary retention    Urinary retention    Urinary tract infection    Wears dentures    Wears glasses    Past Surgical History:  Procedure Laterality Date   AMPUTATION Left 03/29/2017   Procedure: LEFT GREAT TOE AMPUTATION AT METATARSOPHALANGEAL JOINT;  Surgeon: Nadara Mustard, MD;  Location: [facility] OR;  Service: Orthopedics;  Laterality: Left;   AMPUTATION Right 03/29/2017   Procedure: RIGHT BELOW KNEE AMPUTATION;  Surgeon: Nadara Mustard, MD;  Location: [facility] OR;  Service: Orthopedics;  Laterality: Right;   AMPUTATION Left 06/06/2018   Procedure: LEFT 2ND TOE AMPUTATION;  Surgeon: Nadara Mustard, MD;  Location: [facility] OR;  Service: Orthopedics;  Laterality: Left;   AMPUTATION Right 11/19/2018   Procedure: AMPUTATION ABOVE KNEE;  Surgeon: Nadara Mustard, MD;  Location: [facility] OR;  Service: Orthopedics;  Laterality: Right;   ANTERIOR CERVICAL CORPECTOMY N/A 11/25/2015   Procedure: ANTERIOR CERVICAL FIVE CORPECTOMY Cervical four - six fusion;  Surgeon: Lisbeth Renshaw, MD;  Location: [facility];  Service: Neurosurgery;  Laterality: N/A;  ANTERIOR CERVICAL FIVE CORPECTOMY Cervical four - six fusion   APPLICATION OF WOUND VAC Right 10/22/2018   Procedure: Application Of Wound Vac;  Surgeon: Nadara Mustard, MD;  Location: [facility] OR;  Service: Orthopedics;  Laterality: Right;   CYSTOSCOPY WITH BIOPSY N/A 05/01/2022   Procedure: CYSTOSCOPY WITH URETHRAL BIOPSY;  Surgeon: Noel Christmas, MD;  Location:  [facility] ORS;  Service: Urology;  Laterality: N/A;  1 HR   CYSTOSCOPY WITH RETROGRADE URETHROGRAM N/A 04/25/2018   Procedure: CYSTOSCOPY WITH RETROGRADE URETHROGRAM/ BALLOON DILATION;  Surgeon: Ihor Gully, MD;  Location: [facility] ORS;  Service: Urology;  Laterality: N/A;   CYSTOSCOPY WITH URETHRAL DILATATION N/A 05/01/2022   Procedure: BALLOON DILATION WITH  OPTILUME;  Surgeon: Noel Christmas, MD;  Location: [facility] ORS;  Service: Urology;  Laterality: N/A;   LAPAROTOMY N/A 12/03/2022   Procedure: EXPLORATORY LAPAROTOMY  total abdominal colectomy with end ileostomy;  Surgeon: Kinsinger, De Blanch, MD;  Location: [facility] OR;  Service: General;  Laterality: N/A;   MULTIPLE TOOTH EXTRACTIONS     POSTERIOR CERVICAL FUSION/FORAMINOTOMY N/A 11/29/2015   Procedure: Cervical Three-Cervical Seven Posterior Cervical Laminectomy with Fusion;  Surgeon: Lisbeth Renshaw, MD;  Location: [facility];  Service: Neurosurgery;  Laterality: N/A;  Cervical Three-Cervical Seven Posterior Cervical Laminectomy with Fusion  STUMP REVISION Right 10/22/2018   Procedure: REVISION RIGHT BELOW KNEE AMPUTATION;  Surgeon: Nadara Mustard, MD;  Location: [facility] OR;  Service: Orthopedics;  Laterality: Right;   STUMP REVISION Right 12/05/2018   Procedure: Revision Right Above Knee Amputation;  Surgeon: Nadara Mustard, MD;  Location: [facility] OR;  Service: Orthopedics;  Laterality: Right;   STUMP REVISION Right 05/29/2019   Procedure: REVISION RIGHT ABOVE KNEE AMPUTATION;  Surgeon: Nadara Mustard, MD;  Location: [facility] OR;  Service: Orthopedics;  Laterality: Right;   STUMP REVISION Right 06/24/2019   Procedure: STUMP REVISION;  Surgeon: Nadara Mustard, MD;  Location: [facility] OR;  Service: Orthopedics;  Laterality: Right;   Social History:  reports that he has been smoking cigarettes. He has never used smokeless tobacco. He reports that he does not drink alcohol and does not use drugs.  Allergies  Allergen Reactions   Latex Other (See Comments)    Unknown reaction     Family History  Problem Relation Age of Onset   Diabetes Mother    Heart disease Mother    Cancer Mother    Cancer Father    Bone cancer Father    Prostate cancer Father    Cancer Paternal Grandfather    Cancer Other    Diabetes Other     Prior to Admission medications   Medication Sig Start Date End Date Taking? Authorizing Provider  aspirin EC 81 MG tablet Take 81 mg by mouth daily.    [provider]  atorvastatin (LIPITOR) 80 MG tablet Take 1 tablet (80 mg total) by mouth daily. 09/22/22 01/27/24  Jerald Kief, MD  Cholecalciferol 25 MCG (1000 UT) capsule Take 2,000 Units by mouth daily.    [provider]  Cranberry 450 MG TABS Take 450 mg by mouth daily.    [provider]  Dextran 70-Hypromellose (NATURAL BALANCE TEARS) 0.1-0.3 % SOLN Place 1 drop into both eyes every 4 (four) hours as needed (dry eyes).    [provider]  docusate sodium (COLACE) 100 MG capsule Take 100 mg by mouth every 12 (twelve) hours as needed (constipation).    [provider]  escitalopram (LEXAPRO) 20 MG tablet Take 1 tablet (20 mg total) by mouth daily. 08/19/23   Rhetta Mura, MD  famotidine (PEPCID) 20 MG tablet Take 1 tablet (20 mg total) by mouth daily. 07/12/23 08/11/23  Jerald Kief, MD  ferrous sulfate 325 (65 FE) MG tablet Take 1 tablet (325 mg total) by mouth 3 (three) times daily with meals. 01/23/23   Cathren Laine, MD  insulin glargine (LANTUS SOLOSTAR) 100 UNIT/ML Solostar Pen Inject 5 Units into the skin daily. 08/19/23   Rhetta Mura, MD  insulin lispro (HUMALOG) 100 UNIT/ML KwikPen Inject 0-10 Units into the skin See admin instructions. Inject 0-10 units into the skin three times a day and at bedtime, PER SLIDING SCALE:  BGL 151-200 = 2 units 201-250 = 4 units 251-300 = 6 units 301-350 =  8 units 351-400 = 10 units Patient taking differently: Inject 0-10 Units into the skin See admin instructions. Inject 0-10 units into the  skin before meals and at bedtime, PER SLIDING SCALE:  0-150 = 0 units 151-200 = 2 units 201-250 = 4 units 251-300 = 6 units 301-350 =  8 units 351-400 = 10 units 01/31/22   Dahal, Melina Schools, MD  ipratropium-albuterol (DUONEB) 0.5-2.5 (3) MG/3ML SOLN Take 3 mLs by nebulization every 6 (six) hours as needed (shortness of breath, wheezing).  [provider]  lactulose (CHRONULAC) 10 GM/15ML solution Take 30 mLs (20 g total) by mouth 2 (two) times daily. 08/19/23   Rhetta Mura, MD  lidocaine 4 % Place 1 patch onto the skin daily. Remove after 12 hours.    [provider]  magnesium oxide (MAG-OX) 400 (240 Mg) MG tablet Take 400 mg by mouth in the morning, at noon, and at bedtime.    [provider]  Melatonin 10 MG TABS Take 10 mg by mouth at bedtime.    [provider]  Meth-Hyo-M Bl-Na Phos-Ph Sal (URO-MP) 118 MG CAPS Take 118 mg by mouth 2 (two) times daily.    [provider]  Multiple Vitamin (MULTIVITAMIN WITH MINERALS) TABS tablet Take 1 tablet by mouth daily. 12/25/22   Dorcas Carrow, MD  OLANZapine (ZYPREXA) 15 MG tablet Take 1 tablet (15 mg total) by mouth 2 (two) times daily. 08/19/23   Rhetta Mura, MD  omeprazole (PRILOSEC) 20 MG capsule Take 20 mg by mouth at bedtime.    [provider]  OXcarbazepine (TRILEPTAL) 150 MG tablet Take 1 tablet (150 mg total) by mouth 2 (two) times daily. 08/19/23   Rhetta Mura, MD  oxyCODONE (OXYCONTIN) 15 mg 12 hr tablet Take 1 tablet (15 mg total) by mouth in the morning, at noon, and at bedtime. 08/19/23   Rhetta Mura, MD  pregabalin (LYRICA) 150 MG capsule Take 1 capsule (150 mg total) by mouth 2 (two) times daily. 08/19/23   Rhetta Mura, MD  PROTEIN PO Take 30 mLs by mouth 2 (two) times daily. Liquid protein    [provider]  senna (SENOKOT) 8.6 MG TABS tablet Take 2 tablets by mouth at bedtime.    [provider]  sodium bicarbonate 650 MG tablet  Take 650 mg by mouth 2 (two) times daily.    [provider]  tamsulosin (FLOMAX) 0.4 MG CAPS capsule Take 1 capsule (0.4 mg total) by mouth daily. 08/19/23   Rhetta Mura, MD    Physical Exam: Vitals:   09/02/23 1400 09/02/23 1430 09/02/23 1500 09/02/23 1530  BP: 115/85 133/62 118/65 135/79  Pulse: 70 68 62 61  Resp: 20 15 15 13   Temp:      TempSrc:      SpO2: 98% 98% 96% 98%   Constitutional: NAD, calm, comfortable Eyes: PERRL, left periorbital area edematous but not red. Respiratory: clear to auscultation bilaterally although appears to be somewhat more congested in the left upper lobe but difficult to perform adequate auscultation given unable to listen to posterior lung fields as well as patient's body habitus contributing to poor auscultation message. No wheezing, no crackles. Normal respiratory effort. No accessory muscle use.  3 L of oxygen Cardiovascular: Regular rate and rhythm, no murmurs / rubs / gallops.  Marked focal left extremity edema that appears chronic in nature and is consistent with lymphedema. 1-2+ pedal pulses.   Abdomen: no tenderness, no masses palpated. No hepatosplenomegaly. Bowel sounds positive.  Right mid to lower quadrant stoma is prolapsed (see photo) with stool noted in ostomy bag as well as emanating from stoma Urological: Condom catheter in place with green-colored urine noted in bedside bag Musculoskeletal: no clubbing / cyanosis. No joint deformity upper and lower extremities. Good ROM, no contractures.  Skin: Patient has chronic stasis dermatitis involving the entire left lower extremity with multiple areas of bruising or abrasions on the left lower extremity that are oozing serous fluid.  Several pictures of more concerning wounds have  been taken (see below) Neurologic: CN 2-12 grossly intact. Sensation intact, DTR normal. Strength 3/5 x all 4 extremities.  Did briefly and weakly follow simple commands by squeezing my hand Psychiatric:  Remains somnolent but with repeated tactile and verbal stimulation patient will awaken and attempt to answer some questions briefly although his speech is mumbled.   Data Reviewed:  Sodium 123, potassium 5.7, chloride 95, CO2 21, glucose 119, BUN 14, creatinine 1.32, anion gap 7, albumin 2.5, ammonia 33, lactic acid 0.4  WBC 7800, hemoglobin 8.2, platelets 258,000  Urinalysis unremarkable  Alcohol level and urine drug screen as above  Assessment and Plan: Acute metabolic encephalopathy History of hyperactive delirium Likely multifactorial-seems to be influenced by acute hyponatremia as well as concomitant use of sedating drugs prior to admission (narcotics, neuroleptics, and psychotropics) N.p.o. until fully awake Hold offending medications at least for the next 12 to 24 hours Patient had similar presentation in early March and required multiple medication adjustments Treat underlying causes i.e. hyponatremia For now we will hold preadmission Depakote and check level, hold olanzapine, and oxcarbazepine  Hypovolemic hyponatremia Obtain urine and serum osmolality, urine and serum sodium and potassium Normal saline at 100 cc/h Patient has a history of high output ileostomy and if he is not taking in enough fluids this could be contributing Follow sodium/electrolyte panel every 8 hours Avoid rapid correction of sodium Neurological checks every 4 hours Hold Zoloft for now Please note patient was placed on Chronulac to assist with keeping ammonia levels normal prior to discharge and this was continued at the nursing facility.  Patient may be experiencing more frequent bowel movements than usual and this could be contributing to volume depletion.  Prolapsed ileostomy stoma General Surgery has been consulted Active bowel sounds and no clinical findings that would appear consistent with an obstructive process and notable for stool in ostomy bag in oozing out of stoma Stoma has subsequently  been completely reduced by the surgical team   Acute hyperkalemia Likely secondary to volume depletion/volume contraction EKG without signs of hyperkalemia Discussed with attending physician and will give 1 dose of Lokelma  Chronic left lower extremity edema/lymphedema with multiple diabetic ulcers No signs of acute cellulitis and redness of the skin consistent more with stasis dermatitis Presented with 2 ulcerated areas that are oozing serous fluid but have a degree of necrotic fibrinous infiltration Will obtain wound care consultation  Chronic pain right AKA stump Adequate pain control has been difficult in context of undesired side effects regarding somnolence Patient was on OxyContin 15 mg every 12 hours as well as Lyrica 150 mg every 12 hours prior to admission He also is on multiple psychotropic medications in regards to his underlying hyperactive delirium Holding these medications for now    Diabetes mellitus 2 on insulin On Lantus 15 units at bedtime and plan to continue Provide SSI and check CBGs Hemoglobin A1c 6.4 in January which indicates adequate glycemic control  History of right PCA embolic CVA January 2025 Once cleared for diet will continue aspirin and statin  Anemia Current hemoglobin 8.5 with previous hemoglobin on 12/21 9.8 Continue to follow No blood noted in ostomy bag and stool was brown  Hypertension/chronic HFpEF Patient is hypovolemic currently Not on any antihypertensive medications or diuretics prior to admission  COPD No wheezing and does not appear to be exacerbated Will allow for as needed DuoNebs  Recurrent urinary retention Currently has condom catheter in place with green urine noted in bedside bag Wants Foley alert  will resume preadmission Flomax Is also on uro-- NP oral capsule which contains methylene blue-likely source of urine discoloration    Advance Care Planning:   Code Status: Full Code   VTE prophylaxis:  Lovenox  Consults: General Surgery  Family Communication: Patient only    Severity of Illness: The appropriate patient status for this patient is OBSERVATION. Observation status is judged to be reasonable and necessary in order to provide the required intensity of service to ensure the patient's safety. The patient's presenting symptoms, physical exam findings, and initial radiographic and laboratory data in the context of their medical condition is felt to place them at decreased risk for further clinical deterioration. Furthermore, it is anticipated that the patient will be medically stable for discharge from the hospital within 2 midnights of admission.   Author: Junious Silk, NP 09/02/2023 3:56 PM  For on call review [email or web address].

## 2023-09-02 NOTE — ED Triage Notes (Signed)
 Pt to ED Found 30 minutes PTA slumped over in the wheelchair, altered. Before this pt was alert and talking. Hx of using drugs that he gets from family per staff. Ostomy reported to be sticking out more than normal per staff.   88% RA; 98 on mask 5L 120/50 80 174 BG

## 2023-09-03 ENCOUNTER — Other Ambulatory Visit: Payer: Self-pay

## 2023-09-03 DIAGNOSIS — J449 Chronic obstructive pulmonary disease, unspecified: Secondary | ICD-10-CM | POA: Diagnosis present

## 2023-09-03 DIAGNOSIS — D631 Anemia in chronic kidney disease: Secondary | ICD-10-CM | POA: Diagnosis present

## 2023-09-03 DIAGNOSIS — E785 Hyperlipidemia, unspecified: Secondary | ICD-10-CM | POA: Diagnosis present

## 2023-09-03 DIAGNOSIS — Z794 Long term (current) use of insulin: Secondary | ICD-10-CM | POA: Diagnosis not present

## 2023-09-03 DIAGNOSIS — N1832 Chronic kidney disease, stage 3b: Secondary | ICD-10-CM | POA: Diagnosis present

## 2023-09-03 DIAGNOSIS — E871 Hypo-osmolality and hyponatremia: Secondary | ICD-10-CM | POA: Diagnosis present

## 2023-09-03 DIAGNOSIS — R4182 Altered mental status, unspecified: Secondary | ICD-10-CM | POA: Diagnosis present

## 2023-09-03 DIAGNOSIS — G9341 Metabolic encephalopathy: Secondary | ICD-10-CM | POA: Diagnosis present

## 2023-09-03 DIAGNOSIS — K9419 Other complications of enterostomy: Secondary | ICD-10-CM | POA: Diagnosis present

## 2023-09-03 DIAGNOSIS — F17210 Nicotine dependence, cigarettes, uncomplicated: Secondary | ICD-10-CM | POA: Diagnosis present

## 2023-09-03 DIAGNOSIS — E1122 Type 2 diabetes mellitus with diabetic chronic kidney disease: Secondary | ICD-10-CM | POA: Diagnosis present

## 2023-09-03 DIAGNOSIS — G9009 Other idiopathic peripheral autonomic neuropathy: Secondary | ICD-10-CM | POA: Diagnosis present

## 2023-09-03 DIAGNOSIS — E1165 Type 2 diabetes mellitus with hyperglycemia: Secondary | ICD-10-CM | POA: Diagnosis present

## 2023-09-03 DIAGNOSIS — E875 Hyperkalemia: Secondary | ICD-10-CM | POA: Diagnosis present

## 2023-09-03 DIAGNOSIS — I70245 Atherosclerosis of native arteries of left leg with ulceration of other part of foot: Secondary | ICD-10-CM | POA: Diagnosis present

## 2023-09-03 DIAGNOSIS — Z8616 Personal history of COVID-19: Secondary | ICD-10-CM | POA: Diagnosis not present

## 2023-09-03 DIAGNOSIS — E1151 Type 2 diabetes mellitus with diabetic peripheral angiopathy without gangrene: Secondary | ICD-10-CM | POA: Diagnosis present

## 2023-09-03 DIAGNOSIS — Z89611 Acquired absence of right leg above knee: Secondary | ICD-10-CM | POA: Diagnosis not present

## 2023-09-03 DIAGNOSIS — G546 Phantom limb syndrome with pain: Secondary | ICD-10-CM | POA: Diagnosis present

## 2023-09-03 DIAGNOSIS — Y733 Surgical instruments, materials and gastroenterology and urology devices (including sutures) associated with adverse incidents: Secondary | ICD-10-CM | POA: Diagnosis present

## 2023-09-03 DIAGNOSIS — Y838 Other surgical procedures as the cause of abnormal reaction of the patient, or of later complication, without mention of misadventure at the time of the procedure: Secondary | ICD-10-CM | POA: Diagnosis present

## 2023-09-03 DIAGNOSIS — G928 Other toxic encephalopathy: Secondary | ICD-10-CM | POA: Diagnosis present

## 2023-09-03 DIAGNOSIS — E876 Hypokalemia: Secondary | ICD-10-CM | POA: Diagnosis present

## 2023-09-03 DIAGNOSIS — L97528 Non-pressure chronic ulcer of other part of left foot with other specified severity: Secondary | ICD-10-CM | POA: Diagnosis present

## 2023-09-03 DIAGNOSIS — F32A Depression, unspecified: Secondary | ICD-10-CM | POA: Diagnosis present

## 2023-09-03 DIAGNOSIS — I130 Hypertensive heart and chronic kidney disease with heart failure and stage 1 through stage 4 chronic kidney disease, or unspecified chronic kidney disease: Secondary | ICD-10-CM | POA: Diagnosis present

## 2023-09-03 LAB — BASIC METABOLIC PANEL
Anion gap: 8 (ref 5–15)
BUN: 17 mg/dL (ref 8–23)
CO2: 21 mmol/L — ABNORMAL LOW (ref 22–32)
Calcium: 8.2 mg/dL — ABNORMAL LOW (ref 8.9–10.3)
Chloride: 93 mmol/L — ABNORMAL LOW (ref 98–111)
Creatinine, Ser: 1.25 mg/dL — ABNORMAL HIGH (ref 0.61–1.24)
GFR, Estimated: >60 mL/min (ref >60)
Glucose, Bld: 182 mg/dL — ABNORMAL HIGH (ref 70–99)
Potassium: 5 mmol/L (ref 3.5–5.1)
Sodium: 122 mmol/L — ABNORMAL LOW (ref 135–145)

## 2023-09-03 LAB — CBG MONITORING, ED
Glucose-Capillary: 198 mg/dL — ABNORMAL HIGH (ref 70–99)
Glucose-Capillary: 206 mg/dL — ABNORMAL HIGH (ref 70–99)
Glucose-Capillary: 82 mg/dL (ref 70–99)
Glucose-Capillary: 85 mg/dL (ref 70–99)

## 2023-09-03 LAB — CBC
HCT: 23.2 % — ABNORMAL LOW (ref 39.0–52.0)
Hemoglobin: 7.6 g/dL — ABNORMAL LOW (ref 13.0–17.0)
MCH: 29.6 pg (ref 26.0–34.0)
MCHC: 32.8 g/dL (ref 30.0–36.0)
MCV: 90.3 fL (ref 80.0–100.0)
Platelets: 249 10*3/uL (ref 150–400)
RBC: 2.57 MIL/uL — ABNORMAL LOW (ref 4.22–5.81)
RDW: 15.8 % — ABNORMAL HIGH (ref 11.5–15.5)
WBC: 8.2 10*3/uL (ref 4.0–10.5)
nRBC: 0 % (ref 0.0–0.2)

## 2023-09-03 LAB — COMPREHENSIVE METABOLIC PANEL
ALT: 8 U/L (ref 0–44)
AST: 11 U/L — ABNORMAL LOW (ref 15–41)
Albumin: 2.3 g/dL — ABNORMAL LOW (ref 3.5–5.0)
Alkaline Phosphatase: 74 U/L (ref 38–126)
Anion gap: 8 (ref 5–15)
BUN: 12 mg/dL (ref 8–23)
CO2: 21 mmol/L — ABNORMAL LOW (ref 22–32)
Calcium: 8.1 mg/dL — ABNORMAL LOW (ref 8.9–10.3)
Chloride: 95 mmol/L — ABNORMAL LOW (ref 98–111)
Creatinine, Ser: 1.17 mg/dL (ref 0.61–1.24)
GFR, Estimated: >60 mL/min (ref >60)
Glucose, Bld: 82 mg/dL (ref 70–99)
Potassium: 4.8 mmol/L (ref 3.5–5.1)
Sodium: 124 mmol/L — ABNORMAL LOW (ref 135–145)
Total Bilirubin: 0.4 mg/dL (ref 0.0–1.2)
Total Protein: 5.5 g/dL — ABNORMAL LOW (ref 6.5–8.1)

## 2023-09-03 LAB — GLUCOSE, CAPILLARY
Glucose-Capillary: 108 mg/dL — ABNORMAL HIGH (ref 70–99)
Glucose-Capillary: 122 mg/dL — ABNORMAL HIGH (ref 70–99)
Glucose-Capillary: 130 mg/dL — ABNORMAL HIGH (ref 70–99)

## 2023-09-03 MED ORDER — LORAZEPAM 2 MG/ML IJ SOLN
1.0000 mg | Freq: Once | INTRAMUSCULAR | Status: AC | PRN
  Administered 2023-09-03: 1 mg via INTRAVENOUS
  Filled 2023-09-03: qty 1

## 2023-09-03 MED ORDER — OXYCODONE HCL ER 15 MG PO T12A
15.0000 mg | EXTENDED_RELEASE_TABLET | Freq: Two times a day (BID) | ORAL | Status: DC
  Administered 2023-09-03 – 2023-09-08 (×11): 15 mg via ORAL
  Filled 2023-09-03 (×11): qty 1

## 2023-09-03 MED ORDER — MEDIHONEY WOUND/BURN DRESSING EX PSTE
1.0000 | PASTE | Freq: Every day | CUTANEOUS | Status: DC
  Administered 2023-09-03 – 2023-09-08 (×5): 1 via TOPICAL
  Filled 2023-09-03: qty 44

## 2023-09-03 MED ORDER — LORAZEPAM 2 MG/ML IJ SOLN: 1.0000 mg | Freq: Once | INTRAMUSCULAR | Status: DC | PRN

## 2023-09-03 MED ORDER — HALOPERIDOL LACTATE 5 MG/ML IJ SOLN
2.0000 mg | Freq: Once | INTRAMUSCULAR | Status: AC | PRN
  Administered 2023-09-03: 2 mg via INTRAVENOUS
  Filled 2023-09-03: qty 1

## 2023-09-03 NOTE — Progress Notes (Signed)
 Patient refused for dressing change.

## 2023-09-03 NOTE — ED Notes (Addendum)
Pt refused bp and pulse ox

## 2023-09-03 NOTE — Progress Notes (Signed)
 Patient refused his labs again.Secure chat sent to MD Hughie Closs.Patient educated.

## 2023-09-03 NOTE — ED Notes (Signed)
 Pt continues to yell and attempt to get out of the bed. Pt also wants to leave AMA. Dr. Loney Loh made aware

## 2023-09-03 NOTE — Progress Notes (Signed)
 Patient refused his afternoon labs

## 2023-09-03 NOTE — Progress Notes (Addendum)
 PROGRESS NOTE    Gregory [name]  QQV:[id number] DOB: 1961-05-11 DOA: 09/02/2023 PCP: [name], Provider, MD   Brief Narrative:   Gregory [name] is a 63 y.o. 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  Patient was hospitalized January and early March 2025 with somnolence felt to be multifactorial secondary to polypharmacy and hyponatremia.  Adjustments were made in his psychotropic and narcotic medications with additional recommendations for patient to follow-up with psychiatry to adjust psychotropic medications if somnolence or altered mentation persisted.  Reportedly patient was found slumped over the wheelchair with altered mentation/somnolence 30 minutes prior to arrival.  Apparently right before this the patient was alert and talking.  According to staff patient has a history of using nonprescribed medications from his family.  It is also noted per the nursing facility staff that his ostomy was reported to be protruding more than normal.  Upon arrival his room air sat was 88% and he was placed on 5 L O2 via mask with O2 sats 98%.  Glucose was 174.  Vital signs were otherwise stable.   While in the ER stat CT of the head was obtained that did not demonstrate any new or acute intracranial abnormalities.  Venous ABG was unremarkable.  Sodium was 123, potassium was 5.7, CO2 was 21 with a normal anion gap, creatinine was 1.32.  Urinalysis was unremarkable.  Alcohol level was <10 and a urine drug screen was positive for THC.  Patient remained mostly somnolent.  Eventually admitted to hospital service.  Assessment & Plan:   Principal Problem:   Acute metabolic encephalopathy  Acute metabolic  encephalopathy with history of recurrent delirium and encephalopathy, POA: Likely multifactorial.  Polypharmacy vs hyponatremia vs dehydration.  CT head unremarkable, ammonia normal.  Holding all narcotics, psychotropics, neuroleptics and sedative medications.  Treat underlying causes.  Continue lactulose.  Still sleepy, little hard to arouse but when awake he was fully oriented.  Recurrent hypovolemic hyponatremia: Has history of hyponatremia in the past.  Appears to be hypovolemic clinically.  Presented with 123 which has been stable.  Started on normal saline.  Awaiting labs this morning.  [facility] labs are also pending.  Prolapsed ileostomy stoma: Seen by general surgery and stoma reduced at bedside in the ED.  Hyperkalemia: Lokelma ordered.  Awaiting labs today.  CKD stage IIIa: Baseline creatinine appears to be between 1.4-1.6.  Currently better than baseline.  Monitor.  Anemia of chronic disease: Baseline hemoglobin between 9-10.5.  Currently 8.2.  No reports of bleeding.  Monitor closely and transfuse if less than 7.  If drops further, will initiate GI bleed workup.  Will check FOBT.  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  Appears to be chronic.  Wound care following.  Chronic pain right AKA stump: Apparently patient was on OxyContin 15 mg every 12 hours as well as Lyrica and he is also on multiple psychotropic medications.  Holding these medications in the setting of acute encephalopathy. Addendum: Patient became more alert, started crying due to pain.  Resumed his OxyContin.  Holding Lyrica.  Type 2 diabetes mellitus: Recent hemoglobin A1c was 6.4 in January.  Patient was discharged on 5 units of Lantus however this was bumped  up to 15 units at nursing home.  Currently he is n.p.o. so holding all long-acting insulin, continue SSI.  Blood sugar labile.  History of right PCA embolic CVA January 2025: Will continue  aspirin and statin.  COPD: Stable.  Continue DuoNeb.  Depression: Continue Lexapro.  BPH with history of recurrent urinary retention: Doing well currently.  Continue Flomax.  DVT prophylaxis: enoxaparin (LOVENOX) injection 40 mg Start: 09/02/23 1600   Code Status: Full Code  Family Communication:  None present at bedside.  Plan of care discussed with patient in length and he/she verbalized understanding and agreed with it.  Status is: Observation The patient will require care spanning > 2 midnights and should be moved to inpatient because: Patient is still encephalopathic with electrolyte abnormalities.  Needs inpatient management.   Estimated body mass index is 26.58 kg/m as calculated from the following:   Height as of 07/16/23: 6' (1.829 m).   Weight as of 08/19/23: 88.9 kg.  Pressure Injury 07/16/23 Sacrum Medial Stage 1 -  Intact skin with non-blanchable redness of a localized area usually over a bony prominence. non-blanchable redness to sacrum (Active)  07/16/23 0645  Location: Sacrum  Location Orientation: Medial  Staging: Stage 1 -  Intact skin with non-blanchable redness of a localized area usually over a bony prominence.  Wound Description (Comments): non-blanchable redness to sacrum  Present on Admission: Yes  Dressing Type Foam - Lift dressing to assess site every shift 08/18/23 0902   Nutritional Assessment: There is no height or weight on file to calculate BMI.. Seen by dietician.  I agree with the assessment and plan as outlined below: Nutrition Status:        . Skin Assessment: I have examined the patient's skin and I agree with the wound assessment as performed by the wound care RN as outlined below: Pressure Injury 07/16/23 Sacrum Medial Stage 1 -  Intact skin with non-blanchable redness of a localized area usually over a bony prominence. non-blanchable redness to sacrum (Active)  07/16/23 0645  Location: Sacrum  Location Orientation: Medial  Staging:  Stage 1 -  Intact skin with non-blanchable redness of a localized area usually over a bony prominence.  Wound Description (Comments): non-blanchable redness to sacrum  Present on Admission: Yes  Dressing Type Foam - Lift dressing to assess site every shift 08/18/23 0902    Consultants:  None  Procedures:  None  Antimicrobials:  Anti-infectives (From admission, onward)    None         Subjective: Patient seen and examined in the ED, very sleepy, initially hard to arouse but when awake, he was fully oriented.  He was complaining of the right stump pain which is chronic for him.  No other complaint.  Objective: Vitals:   09/03/23 0420 09/03/23 0430 09/03/23 0515 09/03/23 0541  BP: (!) 161/65 137/62 (!) 132/55   Pulse: (!) 59 (!) 58 (!) 56   Resp: 14 (!) 8 14   Temp:    (!) 97.5 F (36.4 C)  TempSrc:    Temporal  SpO2: 98% 95% 98%     Intake/Output Summary (Last 24 hours) at 09/03/2023 0734 Last data filed at 09/02/2023 2118 Gross per 24 hour  Intake 150.74 ml  Output 2000 ml  Net -1849.26 ml   There were no vitals filed for this visit.  Examination:  General exam: Appears calm and comfortable, morbidly obese Respiratory system: Diminished breath sounds due to poor inspiratory effort, I could not hear any crackles, rhonchi  or wheezes. Cardiovascular system: S1 & S2 heard, RRR. No JVD, murmurs, rubs, gallops or clicks. No pedal edema. Gastrointestinal system: Abdomen is nondistended, soft and nontender. No organomegaly or masses felt. Normal bowel sounds heard. Central nervous system: Drowsy but oriented. No focal neurological deficits. Extremities: Symmetric 5 x 5 power. Skin: No rashes, lesions or ulcers  Data Reviewed: I have personally reviewed following labs and imaging studies  CBC: Recent Labs  Lab 09/02/23 1103 09/02/23 1113  WBC 7.8  --   NEUTROABS 5.4  --   HGB 8.2* 8.5*  HCT 24.7* 25.0*  MCV 91.8  --   PLT 258  --    Basic Metabolic  Panel: Recent Labs  Lab 09/02/23 1103 09/02/23 1113 09/02/23 1630 09/02/23 2320  NA 123* 124* 124* 122*  K 5.7* 5.5* 5.8* 5.0  CL 95*  --  96* 93*  CO2 21*  --  23 21*  GLUCOSE 119*  --  103* 182*  BUN 14  --  13 17  CREATININE 1.32*  --  1.35* 1.25*  CALCIUM 8.1*  --  8.2* 8.2*   GFR: CrCl cannot be calculated (Unknown ideal weight.). Liver Function Tests: Recent Labs  Lab 09/02/23 1103  AST 15  ALT 10  ALKPHOS 81  BILITOT 0.7  PROT 5.9*  ALBUMIN 2.5*   No results for input(s): "LIPASE", "AMYLASE" in the last 168 hours. Recent Labs  Lab 09/02/23 1103  AMMONIA 33   Coagulation Profile: No results for input(s): "INR", "PROTIME" in the last 168 hours. Cardiac Enzymes: No results for input(s): "CKTOTAL", "CKMB", "CKMBINDEX", "TROPONINI" in the last 168 hours. BNP (last 3 results) No results for input(s): "PROBNP" in the last 8760 hours. HbA1C: No results for input(s): "HGBA1C" in the last 72 hours. CBG: Recent Labs  Lab 09/02/23 1045 09/02/23 1655 09/02/23 2026 09/03/23 0013 09/03/23 0457  GLUCAP 120* 96 89 206* 85   Lipid Profile: No results for input(s): "CHOL", "HDL", "LDLCALC", "TRIG", "CHOLHDL", "LDLDIRECT" in the last 72 hours. Thyroid Function Tests: No results for input(s): "TSH", "T4TOTAL", "FREET4", "T3FREE", "THYROIDAB" in the last 72 hours. Anemia Panel: No results for input(s): "VITAMINB12", "FOLATE", "FERRITIN", "TIBC", "IRON", "RETICCTPCT" in the last 72 hours. Sepsis Labs: Recent Labs  Lab 09/02/23 1113  LATICACIDVEN 0.4*    No results found for this or any previous visit (from the past 240 hours).   Radiology Studies: CT HEAD WO CONTRAST Result Date: 09/02/2023 CLINICAL DATA:  Mental status change EXAM: CT HEAD WITHOUT CONTRAST TECHNIQUE: Contiguous axial images were obtained from the base of the skull through the vertex without intravenous contrast. RADIATION DOSE REDUCTION: This exam was performed according to the departmental  dose-optimization program which includes automated exposure control, adjustment of the mA and/or kV according to patient size and/or use of iterative reconstruction technique. COMPARISON:  MRI head 06/27/2023, CT head 07/15/2023 FINDINGS: Brain: No acute intracranial hemorrhage. No CT evidence of acute infarct. Nonspecific hypoattenuation in the periventricular and subcortical white matter favored to reflect chronic microvascular ischemic changes. Redemonstrated remote infarct in the right PCA territory no edema, mass effect, or midline shift. The basilar cisterns are patent. Ventricles: Prominence of the ventricles suggesting underlying parenchymal volume loss. Vascular: Atherosclerotic calcifications of the carotid siphons and intracranial vertebral arteries. No hyperdense vessel. Skull: No acute or aggressive finding. Chronic appearing bilateral nasal bone deformities. Orbits: Orbits are symmetric. Sinuses: Mucosal thickening in the ethmoid sinuses and left maxillary sinus. Other: Small left mastoid effusion. Similar focal soft tissue swelling and  surgical clips in the right facial soft tissues. IMPRESSION: No CT evidence of acute intracranial abnormality. Remote right PCA territory infarct. Mild chronic microvascular ischemic changes. Electronically Signed   By: Emily Filbert M.D.   On: 09/02/2023 13:55    Scheduled Meds:  aspirin EC  81 mg Oral Daily   atorvastatin  80 mg Oral Daily   cholecalciferol  2,000 Units Oral Daily   enoxaparin (LOVENOX) injection  40 mg Subcutaneous Q24H   escitalopram  20 mg Oral Daily   ferrous sulfate  325 mg Oral TID WC   insulin aspart  0-15 Units Subcutaneous Q4H   lactulose  20 g Oral BID   leptospermum manuka honey  1 Application Topical Daily   OXcarbazepine  150 mg Oral BID   pregabalin  150 mg Oral BID   sodium chloride flush  3 mL Intravenous Q12H   sodium zirconium cyclosilicate  10 g Oral Once   sodium zirconium cyclosilicate  5 g Oral Once   tamsulosin   0.4 mg Oral Daily   Continuous Infusions:  sodium chloride 100 mL/hr at 09/02/23 1758     LOS: 0 days   Hughie Closs, MD [facility]  09/03/2023, 7:34 AM   *Please note that this is a verbal dictation therefore any spelling or grammatical errors are due to the "Dragon Medical One" system interpretation.  Please page via Amion and do not message via secure chat for urgent patient care matters. Secure chat can be used for non urgent patient care matters.  How to contact the [facility] Attending or Consulting provider 7A - 7P or covering provider during after hours 7P -7A, for this patient?  Check the care team in [facility] and look for a) attending/consulting TRH provider listed and b) the [facility] team listed. Page or secure chat 7A-7P. Log into [email or web address] and use [facility]'s universal password to access. If you do not have the password, please contact the hospital operator. Locate the [facility] provider you are looking for under [facility] and page to a number that you can be directly reached. If you still have difficulty reaching the provider, please page the [facility] (Director on Call) for the Hospitalists listed on amion for assistance.

## 2023-09-03 NOTE — Progress Notes (Signed)
   09/03/23 1546  TOC Brief Assessment  Insurance and Status Reviewed (Medicaid)  Patient has primary care physician Yes (@ [facility])  Home environment has been reviewed From [employer]  Prior level of function: Dependent  Prior/Current Home Services No current home services  Social Drivers of Health Review SDOH reviewed no interventions necessary  Readmission risk has been reviewed Yes (N/A Listed)  Transition of care needs transition of care needs identified, TOC will continue to follow   Patient coming from [facility]. TOC will continue to follow patient for any discharge needs

## 2023-09-03 NOTE — ED Notes (Signed)
CBG 82 

## 2023-09-03 NOTE — ED Notes (Signed)
 Report given to IP nurse. Questions answered

## 2023-09-03 NOTE — Consult Note (Addendum)
 WOC Nurse Consult Note: patient known to [facility] team from previous admissions; has longstanding history of diabetic foot ulcers with prior R AKA by Dr. Lajoyce Corners and Left 2nd toe amputation Dr. Lajoyce Corners Reason for Consult: 2 wounds L lower extremity  Wound type: full thickness likely r/t venous insufficiency and neuropathy  Pressure Injury POA: NA  Measurement: per nursing flowsheet  Wound bed: 1.  Left 3rd toe 100% necrotic 4th digit appears to be 100% black eschar  2. Left lower leg with 100% dark hemorrhagic tissue  Drainage (amount, consistency, odor) tan exudate noted on old dressing  Periwound: edema, dry hyperkeratotic skin  Dressing procedure/placement/frequency: Cleanse L leg wound, L 3rd and 4th digit wounds with Vashe wound cleanser [facility] [place] [phone]) apply Medihoney to wound beds daily, cover with dry gauze and silicone foam or dry gauze and tape whichever is preferred. Can secure entire dressing with Kerlix roll gauze if desired wrapping from toes to just below knees.    POC discussed with bedside nurse. WOC team will not follow. Re-consult if further needs arise.   Patient has end ileostomy placed 11/2022 by Dr. Sheliah Hatch.  This has been evaluated by surgery and prolapse reduced. Will include ostomy supply orders for bedside.  On previous admission had a 1 1/2" stoma so utilizing Lawson #2(2 3/4" skin barrier) and Lawson #649 (2 3/4" pouch).     Thank you,    Priscella Mann MSN, RN-BC, [employer] [phone]

## 2023-09-03 NOTE — Progress Notes (Signed)
Patient refused to have his BP taken.

## 2023-09-03 NOTE — Plan of Care (Signed)
   Problem: Nutritional: Goal: Maintenance of adequate nutrition will improve Outcome: Progressing   Problem: Skin Integrity: Goal: Risk for impaired skin integrity will decrease Outcome: Progressing

## 2023-09-04 DIAGNOSIS — G9341 Metabolic encephalopathy: Secondary | ICD-10-CM | POA: Diagnosis not present

## 2023-09-04 LAB — GLUCOSE, CAPILLARY
Glucose-Capillary: 130 mg/dL — ABNORMAL HIGH (ref 70–99)
Glucose-Capillary: 89 mg/dL (ref 70–99)
Glucose-Capillary: 103 mg/dL — ABNORMAL HIGH (ref 70–99)
Glucose-Capillary: 106 mg/dL — ABNORMAL HIGH (ref 70–99)
Glucose-Capillary: 122 mg/dL — ABNORMAL HIGH (ref 70–99)
Glucose-Capillary: 149 mg/dL — ABNORMAL HIGH (ref 70–99)

## 2023-09-04 MED ORDER — OLANZAPINE 5 MG PO TABS
7.5000 mg | ORAL_TABLET | Freq: Two times a day (BID) | ORAL | Status: DC
  Administered 2023-09-04 (×2): 7.5 mg via ORAL
  Filled 2023-09-04 (×2): qty 2

## 2023-09-04 MED ORDER — SODIUM CHLORIDE 1 G PO TABS
2.0000 g | ORAL_TABLET | Freq: Four times a day (QID) | ORAL | Status: AC
  Administered 2023-09-04 (×2): 2 g via ORAL
  Filled 2023-09-04 (×2): qty 2

## 2023-09-04 MED ORDER — OXYCODONE HCL 5 MG PO TABS
5.0000 mg | ORAL_TABLET | Freq: Four times a day (QID) | ORAL | Status: DC | PRN
  Administered 2023-09-04 (×2): 5 mg via ORAL
  Filled 2023-09-04 (×2): qty 1

## 2023-09-04 MED ORDER — LORAZEPAM 2 MG/ML IJ SOLN
1.0000 mg | Freq: Once | INTRAMUSCULAR | Status: AC | PRN
  Administered 2023-09-04: 1 mg via INTRAVENOUS
  Filled 2023-09-04: qty 1

## 2023-09-04 MED ORDER — HALOPERIDOL LACTATE 5 MG/ML IJ SOLN
2.0000 mg | Freq: Once | INTRAMUSCULAR | Status: AC | PRN
  Administered 2023-09-04: 2 mg via INTRAVENOUS
  Filled 2023-09-04: qty 1

## 2023-09-04 NOTE — TOC Initial Note (Signed)
 Transition of Care [facility]) - Initial/Assessment Note    Patient Details  Name: Gregory [name] MRN: [id number] Date of Birth: 10-18-1960  Transition of [facility] CM/SW Contact:    Mearl Latin, LCSW Phone Number: 09/04/2023, 1:51 PM  Clinical Narrative:                 CSW received update from [facility] that patient has not paid for a bed hold so will follow for timing of medical readiness and bed availability. Patient resides there under long term care.   Expected Discharge Plan: Skilled Nursing Facility Barriers to Discharge: Continued Medical Work up, SNF Pending bed offer   Patient Goals and CMS Choice            Expected Discharge Plan and Services In-house Referral: Clinical Social Work   Post Acute Care Choice: Skilled Nursing Facility Living arrangements for the past 2 months: Skilled Nursing Facility                                      Prior Living Arrangements/Services Living arrangements for the past 2 months: Skilled Nursing Facility Lives with:: Facility Resident Patient language and need for interpreter reviewed:: Yes Do you feel safe going back to the place where you live?: Yes      Need for Family Participation in Patient Care: Yes (Comment) Care giver support system in place?: Yes (comment)   Criminal Activity/Legal Involvement Pertinent to Current Situation/Hospitalization: No - Comment as needed  Activities of Daily Living   ADL Screening (condition at time of admission) Independently performs ADLs?: No Does the patient have a NEW difficulty with bathing/dressing/toileting/self-feeding that is expected to last >3 days?: No (needs assist) Does the patient have a NEW difficulty with getting in/out of bed, walking, or climbing stairs that is expected to last >3 days?: No (needs asist up to wheelchair) Does the patient have a NEW difficulty with communication that is expected to last >3 days?: No Is the patient deaf or have difficulty  hearing?: No Does the patient have difficulty seeing, even when wearing glasses/contacts?: No Does the patient have difficulty concentrating, remembering, or making decisions?: Yes  Permission Sought/Granted Permission sought to share information with : Facility Engineer, maintenance (IT) granted to share info w AGENCY: SNF        Emotional Assessment Appearance:: Appears stated age Attitude/Demeanor/Rapport: Unable to Assess Affect (typically observed): Unable to Assess Orientation: : Oriented to Self, Oriented to Place, Oriented to Situation, Oriented to  Time Alcohol / Substance Use: Not Applicable Psych Involvement: No (comment)  Admission diagnosis:  Hyponatremia [E87.1] Disorientation [R41.0] Acute metabolic encephalopathy [G93.41] Patient Active Problem List   Diagnosis Date Noted   Acute metabolic encephalopathy 09/02/2023   Agitation 08/03/2023   Persistent hyperactive delirium due to multiple etiologies 08/02/2023   Malnutrition of moderate degree 07/26/2023   Acute encephalopathy 07/16/2023   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) 07/03/2023   Fever 06/28/2023   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   Altered mental status 02/08/2023   Abscess of abdominal cavity (HCC) 02/08/2023   Protein-calorie malnutrition, severe 12/19/2022   Occipital stroke (HCC) 11/27/2022   Skin ulcer of left ankle, limited to breakdown of skin (HCC) 09/19/2022   Stroke-like symptom 09/18/2022   Diabetic ulcer of lower leg (HCC) 09/18/2022   Constipation 09/18/2022   Shortness  of breath 09/07/2022   Symptomatic anemia 09/05/2022   Chronic hypoxic respiratory failure (HCC) 09/05/2022   Respiratory failure (HCC) 08/22/2022   Pressure injury of skin 08/22/2022   Influenza A 08/22/2022   Sepsis (HCC) 08/22/2022   Pneumonia 05/01/2022   Right upper lobe pneumonia 09/05/2021   Hyponatremia 09/05/2021   Anxiety 09/05/2021   BPH  (benign prostatic hyperplasia) 09/05/2021   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   Asymptomatic bacteriuria 06/30/2021   Normocytic anemia 06/29/2021   Pain of amputation stump of right lower extremity (HCC) 03/15/2021   Melanoma of cheek (HCC) 10/15/2019   Acute hypoxic respiratory failure (HCC) 07/11/2019   Acute respiratory disease due to COVID-19 virus 07/11/2019   Wound infection 06/22/2019   Abscess 06/22/2019   Chronic kidney disease, stage 3b (HCC) 05/23/2019   Tobacco abuse 05/23/2019   Cellulitis 05/23/2019   S/P AKA (above knee amputation) unilateral, right (HCC) 12/05/2018   Amputation stump infection (HCC)    Lymphedema of left leg    Diabetic polyneuropathy associated with type 2 diabetes mellitus (HCC)    Above knee amputation of right lower extremity (HCC) 10/22/2018   Dehiscence of amputation stump (HCC)    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   Candidiasis 08/12/2018   Necrosis of toe (HCC) 07/08/2018   Amputated toe of left foot (HCC) 06/18/2018   Streptococcal infection    Pressure injury, stage 3 (HCC) 06/06/2018   Cutaneous abscess of left foot    Moderate protein-calorie malnutrition (HCC)    Cellulitis of left lower extremity    Septic arthritis (HCC) 06/03/2018   Idiopathic chronic venous hypertension of left lower extremity with ulcer and inflammation (HCC) 11/14/2017   Great toe amputation status, left 11/14/2017   Enlarged lymph node in neck 07/18/2017   Malignant melanoma of face (HCC) 04/16/2017   Osteomyelitis (HCC) 03/25/2017   Skin cancer 03/25/2017   Depression 11/05/2016   Diabetic ulcer of toe of left foot (HCC) 10/02/2016   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/02/2016   Chronic pain 09/27/2016   DM2 (diabetes mellitus, type 2) (HCC) 06/06/2016   GERD without esophagitis 05/11/2016   Hypertensive heart disease with congestive heart failure (HCC) 12/15/2015   Spinal stenosis in cervical region 12/15/2015   Type II diabetes mellitus  with neurological manifestations (HCC) 12/15/2015   Chronic diastolic HF (heart failure) (HCC)    Pulmonary hypertension (HCC)    HTN (hypertension)    HLD (hyperlipidemia)    Urinary retention    Diskitis    Foot drop, bilateral    Sepsis with acute renal failure without septic shock (HCC)    COPD (chronic obstructive pulmonary disease) (HCC) 10/03/2015   Acute osteomyelitis of left foot (HCC) 10/03/2015   MRSA bacteremia 08/13/2015   Chronic low back pain 08/13/2015   Chronic multifocal osteomyelitis, multiple sites (HCC) 08/13/2015   Community acquired pneumonia 08/13/2015   Encephalopathy 03/25/2012   PCP:  [name], Provider, MD Pharmacy:   [facility] [address] [place] [address] Phone: [phone] Fax: [phone]     Social Drivers of Health (SDOH) Social History: SDOH Screenings   Food Insecurity: No Food Insecurity (09/03/2023)  Housing: Low Risk  (09/03/2023)  Transportation Needs: No Transportation Needs (09/03/2023)  Utilities: Not At Risk (09/03/2023)  Depression (PHQ2-9): Low Risk  (03/08/2021)  Tobacco Use: High Risk (09/02/2023)   SDOH Interventions:     Readmission Risk Interventions    09/04/2023    1:49 PM 09/06/2022  1:48 PM 08/27/2022   12:46 PM  Readmission Risk Prevention Plan  Transportation Screening Complete Complete Complete  Medication Review Oceanographer) Complete Complete Complete  PCP or Specialist appointment within 3-5 days of discharge Complete Complete Complete  HRI or Home Care Consult Complete Complete Complete  SW Recovery Care/Counseling Consult Complete Complete Complete  Palliative Care Screening Not Applicable Not Applicable Not Applicable  Skilled Nursing Facility Complete Complete Complete

## 2023-09-04 NOTE — Plan of Care (Signed)

## 2023-09-04 NOTE — Progress Notes (Signed)
 PROGRESS NOTE    Gregory [name]  AOZ:[id number] DOB: 1960/11/09 DOA: 09/02/2023 PCP: [name], Provider, MD   Brief Narrative:    Gregory [name] is a 63 y.o. 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  Patient was hospitalized January and early March 2025 with somnolence felt to be multifactorial secondary to polypharmacy and hyponatremia.  Adjustments were made in his psychotropic and narcotic medications with additional recommendations for patient to follow-up with psychiatry to adjust psychotropic medications if somnolence or altered mentation persisted.  Reportedly patient was found slumped over the wheelchair with altered mentation/somnolence 30 minutes prior to arrival.  Apparently right before this the patient was alert and talking.  According to staff patient has a history of using nonprescribed medications from his family.  It is also noted per the nursing facility staff that his ostomy was reported to be protruding more than normal.  Upon arrival his room air sat was 88% and he was placed on 5 L O2 via mask with O2 sats 98%.  Glucose was 174.  Vital signs were otherwise stable.   While in the ER stat CT of the head was obtained that did not demonstrate any new or acute intracranial abnormalities.  Venous ABG was unremarkable.  Sodium was 123, potassium was 5.7, CO2 was 21 with a normal anion gap, creatinine was 1.32.  Urinalysis was unremarkable.  Alcohol level was <10 and a urine drug screen was positive for THC.  Patient remained mostly somnolent.  Eventually admitted to hospital service.  Assessment & Plan:   Principal Problem:   Acute metabolic encephalopathy  Acute metabolic  encephalopathy with history of recurrent delirium and encephalopathy, POA:  -Likely multifactorial.  Polypharmacy vs hyponatremia vs dehydration.  CT head unremarkable, ammonia normal.  Holding all narcotics, psychotropics, neuroleptics and sedative medications.  Treat underlying causes.  Continue lactulose. -Patient keep fluctuating, he is with some restlessness and agitation, so we will resume gradually on his home dose medication including Zyprexa.  Recurrent hypovolemic hyponatremia:  -Has history of hyponatremia in the past.  Appears to be hypovolemic clinically.  Presented with 123 which has been stable.  Started on normal saline.    Prolapsed ileostomy stoma: Seen by general surgery and stoma reduced at bedside in the ED.  Hyperkalemia: Lokelma ordered.  Awaiting labs today.  CKD stage IIIa: Baseline creatinine appears to be between 1.4-1.6.  Currently better than baseline.  Monitor.  Anemia of chronic disease: Baseline hemoglobin between 9-10.5.  Currently 8.2.  No reports of bleeding.  Monitor closely and transfuse if less than 7.  If drops further, will initiate GI bleed workup.  Will check FOBT.  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  Appears to be chronic.  Wound care following.  Chronic pain right AKA stump: Apparently patient was on OxyContin 15 mg every 12 hours as well as Lyrica and he is also on multiple psychotropic medications.  Holding these medications in the setting of acute encephalopathy. -Patient back on his OxyContin, -Lyrica remains on hold, resume when he is fully awake  Anemia -Likely delusional, will hold Lovenox, continue with aspirin, being Hemoccult  Type 2 diabetes mellitus: Recent hemoglobin A1c was 6.4 in January.  Patient was discharged on 5 units of Lantus however this was bumped up to 15 units at nursing home.  Currently he is n.p.o. so holding all long-acting insulin, continue  SSI.  Blood sugar labile.  History of right PCA embolic CVA January 2025: Will continue aspirin and statin.  COPD: Stable.  Continue DuoNeb.  Depression: Continue Lexapro.  BPH with history of recurrent urinary retention: Doing well currently.  Continue Flomax.  DVT prophylaxis: enoxaparin (LOVENOX) injection 40 mg Start: 09/02/23 1600   Code Status: Full Code  Family Communication:  None present at bedside.    Status is: Inpatient The patient will require care spanning > 2 midnights and should be moved to inpatient because: Patient is still encephalopathic with electrolyte abnormalities.  Needs inpatient management.   Estimated body mass index is 26.58 kg/m as calculated from the following:   Height as of 07/16/23: 6' (1.829 m).   Weight as of 08/19/23: 88.9 kg.  Pressure Injury 07/16/23 Sacrum Medial Stage 1 -  Intact skin with non-blanchable redness of a localized area usually over a bony prominence. non-blanchable redness to sacrum (Active)  07/16/23 0645  Location: Sacrum  Location Orientation: Medial  Staging: Stage 1 -  Intact skin with non-blanchable redness of a localized area usually over a bony prominence.  Wound Description (Comments): non-blanchable redness to sacrum  Present on Admission: Yes  Dressing Type Foam - Lift dressing to assess site every shift 08/18/23 0902   Nutritional Assessment: There is no height or weight on file to calculate BMI.. Seen by dietician.  I agree with the assessment and plan as outlined below: Nutrition Status:        . Skin Assessment: I have examined the patient's skin and I agree with the wound assessment as performed by the wound care RN as outlined below: Pressure Injury 07/16/23 Sacrum Medial Stage 1 -  Intact skin with non-blanchable redness of a localized area usually over a bony prominence. non-blanchable redness to sacrum (Active)  07/16/23 0645  Location: Sacrum  Location Orientation: Medial  Staging: Stage 1 -  Intact  skin with non-blanchable redness of a localized area usually over a bony prominence.  Wound Description (Comments): non-blanchable redness to sacrum  Present on Admission: Yes  Dressing Type Foam - Lift dressing to assess site every shift 08/18/23 0902    Consultants:  None  Procedures:  None  Antimicrobials:  Anti-infectives (From admission, onward)    None         Subjective:  Patient with restlessness and agitation overnight, this morning he is more quiet.  Objective: Vitals:   09/04/23 0000 09/04/23 0900 09/04/23 1100 09/04/23 1204  BP: (!) 160/66 (!) 156/66  (!) 142/65  Pulse: (!) 57     Resp: 16 16  14   Temp:  (!) 97.4 F (36.3 C) 97.8 F (36.6 C) 97.6 F (36.4 C)  TempSrc:  Axillary Axillary Axillary  SpO2: 93% 97%      Intake/Output Summary (Last 24 hours) at 09/04/2023 1345 Last data filed at 09/04/2023 0900 Gross per 24 hour  Intake 1785.07 ml  Output 3300 ml  Net -1514.93 ml   There were no vitals filed for this visit.  Examination:  Sleeping comfortably, calm, no apparent distress Good air entry bilaterally Regular rate and rhythm Abdomen soft Right BKA Left foot multiple toes amputation  Data Reviewed: I have personally reviewed following labs and imaging studies  CBC: Recent Labs  Lab 09/02/23 1103 09/02/23 1113 09/03/23 0638  WBC  7.8  --  8.2  NEUTROABS 5.4  --   --   HGB 8.2* 8.5* 7.6*  HCT 24.7* 25.0* 23.2*  MCV 91.8  --  90.3  PLT 258  --  249   Basic Metabolic Panel: Recent Labs  Lab 09/02/23 1103 09/02/23 1113 09/02/23 1630 09/02/23 2320 09/03/23 0638  NA 123* 124* 124* 122* 124*  K 5.7* 5.5* 5.8* 5.0 4.8  CL 95*  --  96* 93* 95*  CO2 21*  --  23 21* 21*  GLUCOSE 119*  --  103* 182* 82  BUN 14  --  13 17 12   CREATININE 1.32*  --  1.35* 1.25* 1.17  CALCIUM 8.1*  --  8.2* 8.2* 8.1*   GFR: CrCl cannot be calculated (Unknown ideal weight.). Liver Function Tests: Recent Labs  Lab 09/02/23 1103 09/03/23 0638   AST 15 11*  ALT 10 8  ALKPHOS 81 74  BILITOT 0.7 0.4  PROT 5.9* 5.5*  ALBUMIN 2.5* 2.3*   No results for input(s): "LIPASE", "AMYLASE" in the last 168 hours. Recent Labs  Lab 09/02/23 1103  AMMONIA 33   Coagulation Profile: No results for input(s): "INR", "PROTIME" in the last 168 hours. Cardiac Enzymes: No results for input(s): "CKTOTAL", "CKMB", "CKMBINDEX", "TROPONINI" in the last 168 hours. BNP (last 3 results) No results for input(s): "PROBNP" in the last 8760 hours. HbA1C: No results for input(s): "HGBA1C" in the last 72 hours. CBG: Recent Labs  Lab 09/03/23 2016 09/03/23 2343 09/04/23 0317 09/04/23 0855 09/04/23 1202  GLUCAP 130* 108* 130* 89 103*   Lipid Profile: No results for input(s): "CHOL", "HDL", "LDLCALC", "TRIG", "CHOLHDL", "LDLDIRECT" in the last 72 hours. Thyroid Function Tests: No results for input(s): "TSH", "T4TOTAL", "FREET4", "T3FREE", "THYROIDAB" in the last 72 hours. Anemia Panel: No results for input(s): "VITAMINB12", "FOLATE", "FERRITIN", "TIBC", "IRON", "RETICCTPCT" in the last 72 hours. Sepsis Labs: Recent Labs  Lab 09/02/23 1113  LATICACIDVEN 0.4*    No results found for this or any previous visit (from the past 240 hours).   Radiology Studies: No results found.   Scheduled Meds:  aspirin EC  81 mg Oral Daily   atorvastatin  80 mg Oral Daily   cholecalciferol  2,000 Units Oral Daily   enoxaparin (LOVENOX) injection  40 mg Subcutaneous Q24H   escitalopram  20 mg Oral Daily   ferrous sulfate  325 mg Oral TID WC   insulin aspart  0-15 Units Subcutaneous Q4H   lactulose  20 g Oral BID   leptospermum manuka honey  1 Application Topical Daily   OXcarbazepine  150 mg Oral BID   oxyCODONE  15 mg Oral Q12H   pregabalin  150 mg Oral BID   sodium chloride flush  3 mL Intravenous Q12H   tamsulosin  0.4 mg Oral Daily   Continuous Infusions:  sodium chloride Stopped (09/04/23 0700)     LOS: 1 day   Huey Bienenstock, MD [facility]  09/04/2023, 1:45 PM   *Please note that this is a verbal dictation therefore any spelling or grammatical errors are due to the "Dragon Medical One" system interpretation.  Please page via Amion and do not message via secure chat for urgent patient care matters. Secure chat can be used for non urgent patient care matters.  How to contact the [facility] Attending or Consulting provider 7A - 7P or covering provider during after hours 7P -7A, for this patient?  Check the care team in [facility] and look for a)  attending/consulting TR[facility] provider listed and b) the [facility] team listed. Page or secure chat 7A-7P. Log into [email or web address] and use [place]'s universal password to access. If you do not have the password, please contact the hospital operator. Locate the [facility] provider you are looking for under [facility] and page to a number that you can be directly reached. If you still have difficulty reaching the provider, please page the [facility] (Director on Call) for the Hospitalists listed on amion for assistance.

## 2023-09-04 NOTE — Progress Notes (Signed)
 Patient screaming at staff demanding drinks and food. Nursing has given the patient drinks and explained that the cafeteria is not open for breakfast yet. Patient continues to scream over staff, remove ostomy refuse care and attempt to get out of bed. Dr. Loney Loh made aware of patients behavior and verbal threats.

## 2023-09-04 NOTE — Progress Notes (Signed)
 Patient screaming and threatening staff stating he wants to leave. Patient ripped off condom cath and urinated on the bed. Dr. Loney Loh on the phone with patient attempting to speak to him. The patient continued to scream and threaten staff, hitting bed and throwing items on floor. Patient removed Ostomy bag, not allowing staff to put it back on. Patient also refusing labs, vitals, attempting to take IV out. Patient A/Ox4, but screaming over nursing staff. Patient attempting to get out of bed.  Dr. Loney Loh aware. IV medications given. Patient resting comfortably. Ostomy and condom  cath reapplied. Will continue to monitor.

## 2023-09-05 DIAGNOSIS — G9341 Metabolic encephalopathy: Secondary | ICD-10-CM | POA: Diagnosis not present

## 2023-09-05 LAB — GLUCOSE, CAPILLARY
Glucose-Capillary: 120 mg/dL — ABNORMAL HIGH (ref 70–99)
Glucose-Capillary: 140 mg/dL — ABNORMAL HIGH (ref 70–99)
Glucose-Capillary: 145 mg/dL — ABNORMAL HIGH (ref 70–99)
Glucose-Capillary: 80 mg/dL (ref 70–99)
Glucose-Capillary: 88 mg/dL (ref 70–99)
Glucose-Capillary: 95 mg/dL (ref 70–99)

## 2023-09-05 LAB — CBC
HCT: 22.2 % — ABNORMAL LOW (ref 39.0–52.0)
Hemoglobin: 7.3 g/dL — ABNORMAL LOW (ref 13.0–17.0)
MCH: 29.7 pg (ref 26.0–34.0)
MCHC: 32.9 g/dL (ref 30.0–36.0)
MCV: 90.2 fL (ref 80.0–100.0)
Platelets: 251 10*3/uL (ref 150–400)
RBC: 2.46 MIL/uL — ABNORMAL LOW (ref 4.22–5.81)
RDW: 15.4 % (ref 11.5–15.5)
WBC: 5.5 10*3/uL (ref 4.0–10.5)
nRBC: 0 % (ref 0.0–0.2)

## 2023-09-05 LAB — BASIC METABOLIC PANEL
Anion gap: 10 (ref 5–15)
BUN: 12 mg/dL (ref 8–23)
CO2: 19 mmol/L — ABNORMAL LOW (ref 22–32)
Calcium: 8 mg/dL — ABNORMAL LOW (ref 8.9–10.3)
Chloride: 97 mmol/L — ABNORMAL LOW (ref 98–111)
Creatinine, Ser: 1.11 mg/dL (ref 0.61–1.24)
GFR, Estimated: >60 mL/min (ref >60)
Glucose, Bld: 149 mg/dL — ABNORMAL HIGH (ref 70–99)
Potassium: 4.5 mmol/L (ref 3.5–5.1)
Sodium: 126 mmol/L — ABNORMAL LOW (ref 135–145)

## 2023-09-05 LAB — PREPARE RBC (CROSSMATCH)

## 2023-09-05 MED ORDER — DIVALPROEX SODIUM 125 MG PO DR TAB
125.0000 mg | DELAYED_RELEASE_TABLET | Freq: Three times a day (TID) | ORAL | Status: DC
  Administered 2023-09-05 – 2023-09-08 (×10): 125 mg via ORAL
  Filled 2023-09-05 (×11): qty 1

## 2023-09-05 MED ORDER — HALOPERIDOL LACTATE 5 MG/ML IJ SOLN: 5.0000 mg | Freq: Once | INTRAMUSCULAR | Status: DC | PRN

## 2023-09-05 MED ORDER — HYDROMORPHONE HCL 1 MG/ML IJ SOLN
0.5000 mg | INTRAMUSCULAR | Status: DC | PRN
  Administered 2023-09-05 – 2023-09-06 (×5): 0.5 mg via INTRAVENOUS
  Filled 2023-09-05 (×5): qty 0.5

## 2023-09-05 MED ORDER — OXYCODONE HCL 5 MG PO TABS
5.0000 mg | ORAL_TABLET | Freq: Four times a day (QID) | ORAL | Status: DC | PRN
  Administered 2023-09-05 – 2023-09-07 (×5): 5 mg via ORAL
  Filled 2023-09-05 (×5): qty 1

## 2023-09-05 MED ORDER — ENSURE ENLIVE PO LIQD
237.0000 mL | Freq: Three times a day (TID) | ORAL | Status: DC
Start: 2023-09-05 — End: 2023-09-08
  Administered 2023-09-05 – 2023-09-07 (×4): 237 mL via ORAL

## 2023-09-05 MED ORDER — LORAZEPAM 2 MG/ML IJ SOLN
1.0000 mg | Freq: Once | INTRAMUSCULAR | Status: AC
  Administered 2023-09-05: 1 mg via INTRAVENOUS
  Filled 2023-09-05: qty 1

## 2023-09-05 MED ORDER — HYDROMORPHONE HCL 1 MG/ML IJ SOLN: 0.5000 mg | Freq: Once | INTRAMUSCULAR | Status: DC | PRN

## 2023-09-05 MED ORDER — HYDROMORPHONE HCL 1 MG/ML IJ SOLN
0.5000 mg | Freq: Once | INTRAMUSCULAR | Status: AC
  Administered 2023-09-05: 0.5 mg via INTRAVENOUS
  Filled 2023-09-05: qty 0.5

## 2023-09-05 MED ORDER — PROSOURCE PLUS PO LIQD
30.0000 mL | Freq: Two times a day (BID) | ORAL | Status: DC
  Administered 2023-09-05 – 2023-09-07 (×2): 30 mL via ORAL
  Filled 2023-09-05 (×4): qty 30

## 2023-09-05 MED ORDER — OLANZAPINE 5 MG PO TABS
15.0000 mg | ORAL_TABLET | Freq: Two times a day (BID) | ORAL | Status: DC
  Administered 2023-09-05 – 2023-09-08 (×7): 15 mg via ORAL
  Filled 2023-09-05 (×7): qty 3

## 2023-09-05 MED ORDER — FLUTICASONE PROPIONATE 50 MCG/ACT NA SUSP
2.0000 | Freq: Every day | NASAL | Status: DC
  Administered 2023-09-05 – 2023-09-08 (×4): 2 via NASAL
  Filled 2023-09-05 (×2): qty 16

## 2023-09-05 MED ORDER — SODIUM CHLORIDE 1 G PO TABS
2.0000 g | ORAL_TABLET | Freq: Four times a day (QID) | ORAL | Status: AC
  Administered 2023-09-05 (×2): 2 g via ORAL
  Filled 2023-09-05 (×2): qty 2

## 2023-09-05 MED ORDER — SODIUM CHLORIDE 0.9% IV SOLUTION: Freq: Once | INTRAVENOUS | Status: DC

## 2023-09-05 NOTE — Plan of Care (Signed)

## 2023-09-05 NOTE — Progress Notes (Signed)
 PROGRESS NOTE    CLARKSON ROSSELLI  NGE:[id number] DOB: [birth year] DOA: 09/02/2023 PCP: [name], Provider, MD   Brief Narrative:    Gregory [name] is a 63 y.o. 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  Patient was hospitalized January and early March 2025 with somnolence felt to be multifactorial secondary to polypharmacy and hyponatremia.  Adjustments were made in his psychotropic and narcotic medications with additional recommendations for patient to follow-up with psychiatry to adjust psychotropic medications if somnolence or altered mentation persisted.  Reportedly patient was found slumped over the wheelchair with altered mentation/somnolence 30 minutes prior to arrival.  Apparently right before this the patient was alert and talking.  According to staff patient has a history of using nonprescribed medications from his family.  It is also noted per the nursing facility staff that his ostomy was reported to be protruding more than normal.  Upon arrival his room air sat was 88% and he was placed on 5 L O2 via mask with O2 sats 98%.  Glucose was 174.  Vital signs were otherwise stable.   While in the ER stat CT of the head was obtained that did not demonstrate any new or acute intracranial abnormalities.  Venous ABG was unremarkable.  Sodium was 123, potassium was 5.7, CO2 was 21 with a normal anion gap, creatinine was 1.32.  Urinalysis was unremarkable.  Alcohol level was <10 and a urine drug screen was positive for THC.  Patient remained mostly somnolent.  Eventually admitted to hospital service.  Assessment & Plan:   Principal Problem:   Acute metabolic encephalopathy  Acute metabolic  encephalopathy with history of recurrent delirium and encephalopathy, POA:  -Likely multifactorial.  Polypharmacy vs hyponatremia vs dehydration.  As well urine drug screen positive for THC CT head unremarkable, ammonia normal. -Patient has improved after holding all narcotics, psychotropics and neuroleptic sedative medication, but given he is back to his baseline of increased restlessness and agitation he had to be resumed back on these meds gradually.   Recurrent hypovolemic hyponatremia:  -Has history of hyponatremia in the past.  Appears to be hypovolemic clinically.  Presented with 123 which has been stable.  -Improving, 126 today.  Prolapsed ileostomy stoma:  -Seen by general surgery and stoma reduced at bedside in the ED.  Hyperkalemia:  -Solved with Lokelma  CKD stage IIIa:  -Baseline creatinine appears to be between 1.4-1.6.  Currently better than baseline.  Monitor.  Anemia of chronic disease:  -Baseline hemoglobin is around 9, has dropped during hospital stay to 7.3 morning, will transfuse 1 unit PRBC, no evidence of GI bleed, stool with normal color and ileostomy.  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  Appears to be chronic.  Wound care following.  Chronic pain right AKA stump:  -Apparently patient was on OxyContin 15 mg every 12 hours as well as Lyrica and he is also on multiple psychotropic medications.  Location has been resumed gradually  Type 2 diabetes mellitus:  -Recent hemoglobin A1c was 6.4 in January.  Patient was discharged on 5 units of Lantus however this was bumped up to 15 units at nursing home.  Keep on insulin sliding scale.  History of right  PCA embolic CVA January 2025: Will continue aspirin and statin.  COPD: Stable.  Continue DuoNeb.  Depression: Continue Lexapro.  BPH with history of recurrent urinary retention: Doing well currently.  Continue Flomax.  DVT prophylaxis:     Code Status: Full Code I have left his daughter a Engineer, technical sales. Family Communication:  None present at bedside.    Status is: Inpatient The patient will require care spanning > 2 midnights and should be moved to inpatient because: Patient is still encephalopathic with electrolyte abnormalities.  Needs inpatient management.   Estimated body mass index is 26.58 kg/m as calculated from the following:   Height as of 07/16/23: 6' (1.829 m).   Weight as of 08/19/23: 88.9 kg.  Pressure Injury 07/16/23 Sacrum Medial Stage 1 -  Intact skin with non-blanchable redness of a localized area usually over a bony prominence. non-blanchable redness to sacrum (Active)  07/16/23 0645  Location: Sacrum  Location Orientation: Medial  Staging: Stage 1 -  Intact skin with non-blanchable redness of a localized area usually over a bony prominence.  Wound Description (Comments): non-blanchable redness to sacrum  Present on Admission: Yes  Dressing Type Foam - Lift dressing to assess site every shift 09/04/23 1945   Nutritional Assessment: There is no height or weight on file to calculate BMI.. Seen by dietician.  I agree with the assessment and plan as outlined below: Nutrition Status:        . Skin Assessment: I have examined the patient's skin and I agree with the wound assessment as performed by the wound care RN as outlined below: Pressure Injury 07/16/23 Sacrum Medial Stage 1 -  Intact skin with non-blanchable redness of a localized area usually over a bony prominence. non-blanchable redness to sacrum (Active)  07/16/23 0645  Location: Sacrum  Location Orientation: Medial  Staging: Stage 1 -  Intact skin with non-blanchable redness of a localized area usually over a bony prominence.  Wound Description (Comments): non-blanchable redness to sacrum  Present on Admission: Yes  Dressing Type Foam - Lift dressing to assess site every shift 09/04/23 1945    Consultants:  None  Procedures:   None  Antimicrobials:  Anti-infectives (From admission, onward)    None         Subjective:  This morning he is comfortable, asking when he can go back to his facility, overnight he was restless  Objective: Vitals:   09/04/23 2021 09/05/23 0025 09/05/23 0749 09/05/23 1145  BP: (!) 154/96 109/65 (!) 126/55 (!) 149/53  Pulse: 67 93    Resp:  20 14 17   Temp: 97.9 F (36.6 C) 98.6 F (37 C) 98.1 F (36.7 C) 97.7 F (36.5 C)  TempSrc: Oral Oral Oral Oral  SpO2: 97% 98% 98% 98%    Intake/Output Summary (Last 24 hours) at 09/05/2023 1207 Last data filed at 09/05/2023 0645 Gross per 24 hour  Intake 2006.93 ml  Output 4400 ml  Net -2393.07 ml   There were no vitals filed for this visit.  Examination:  Awake, eating breakfast, no apparent distress Symmetrical Chest wall movement, Good air movement bilaterally, CTAB RRR,No Gallops,Rubs or new Murmurs, No Parasternal Heave Right BKA Left foot multiple toes amputation  Data Reviewed: I have personally reviewed following labs and imaging studies  CBC: Recent Labs  Lab 09/02/23 1103 09/02/23 1113 09/03/23 0638 09/05/23 0806  WBC 7.8  --  8.2 5.5  NEUTROABS 5.4  --   --   --   HGB 8.2* 8.5* 7.6* 7.3*  HCT 24.7* 25.0* 23.2* 22.2*  MCV 91.8  --  90.3 90.2  PLT 258  --  249 251   Basic Metabolic Panel: Recent Labs  Lab 09/02/23 1103 09/02/23 1113 09/02/23 1630 09/02/23 2320 09/03/23 0638 09/05/23 0806  NA 123* 124* 124* 122* 124* 126*  K 5.7* 5.5* 5.8* 5.0 4.8 4.5  CL 95*  --  96* 93* 95* 97*  CO2 21*  --  23 21* 21* 19*  GLUCOSE 119*  --  103* 182* 82 149*  BUN 14  --  13 17 12 12   CREATININE 1.32*  --  1.35* 1.25* 1.17 1.11  CALCIUM 8.1*  --  8.2* 8.2* 8.1* 8.0*   GFR: CrCl cannot be calculated (Unknown ideal weight.). Liver Function Tests: Recent Labs  Lab 09/02/23 1103 09/03/23 0638  AST 15 11*  ALT 10 8  ALKPHOS 81 74  BILITOT 0.7 0.4  PROT 5.9* 5.5*  ALBUMIN 2.5* 2.3*   No results for  input(s): "LIPASE", "AMYLASE" in the last 168 hours. Recent Labs  Lab 09/02/23 1103  AMMONIA 33   Coagulation Profile: No results for input(s): "INR", "PROTIME" in the last 168 hours. Cardiac Enzymes: No results for input(s): "CKTOTAL", "CKMB", "CKMBINDEX", "TROPONINI" in the last 168 hours. BNP (last 3 results) No results for input(s): "PROBNP" in the last 8760 hours. HbA1C: No results for input(s): "HGBA1C" in the last 72 hours. CBG: Recent Labs  Lab 09/04/23 1750 09/04/23 2020 09/04/23 2325 09/05/23 0026 09/05/23 0745  GLUCAP 106* 122* 149* 140* 145*   Lipid Profile: No results for input(s): "CHOL", "HDL", "LDLCALC", "TRIG", "CHOLHDL", "LDLDIRECT" in the last 72 hours. Thyroid Function Tests: No results for input(s): "TSH", "T4TOTAL", "FREET4", "T3FREE", "THYROIDAB" in the last 72 hours. Anemia Panel: No results for input(s): "VITAMINB12", "FOLATE", "FERRITIN", "TIBC", "IRON", "RETICCTPCT" in the last 72 hours. Sepsis Labs: Recent Labs  Lab 09/02/23 1113  LATICACIDVEN 0.4*    No results found for this or any previous visit (from the past 240 hours).   Radiology Studies: No results found.   Scheduled Meds:  aspirin EC  81 mg Oral Daily   atorvastatin  80 mg Oral Daily   cholecalciferol  2,000 Units Oral Daily   escitalopram  20 mg Oral Daily   ferrous sulfate  325 mg Oral TID WC   insulin aspart  0-15 Units Subcutaneous Q4H   lactulose  20 g Oral BID   leptospermum manuka honey  1 Application Topical Daily   OLANZapine  15 mg Oral BID   OXcarbazepine  150 mg Oral BID   oxyCODONE  15 mg Oral Q12H   pregabalin  150 mg Oral BID   sodium chloride flush  3 mL Intravenous Q12H   tamsulosin  0.4 mg Oral Daily   Continuous Infusions:  sodium chloride 75 mL/hr at 09/04/23 1906     LOS: 2 days   Huey Bienenstock, MD [facility]  09/05/2023, 12:07 PM   *Please note that this is a verbal dictation therefore any spelling or grammatical errors are due  to the "[employer]agon [employer] One" system interpretation.  Please page via Amion and do not message via secure chat for urgent patient care matters. Secure chat can be used for non urgent patient care matters.  How to contact the [facility] Attending or Consulting provider 7A - 7P or covering provider during after hours 7P -7A, for this patient?  Check the care team in [facility] and look for a) attending/consulting TR[facility] provider listed and b) the  TR[facility] team listed. Page or secure chat 7A-7P. Log into [email or web address] and use [place]'s universal password to access. If you do not have the password, please contact the hospital operator. Locate the [facility] provider you are looking for under [facility] and page to a number that you can be directly reached. If you still have difficulty reaching the provider, please page the [facility] (Director on Call) for the Hospitalists listed on amion for assistance.

## 2023-09-05 NOTE — Progress Notes (Signed)
   09/05/23 0025  What Happened  Was fall witnessed? No  Was patient injured? No  Patient found on floor  Found by Staff-comment  Stated prior activity to/from bed, chair, or stretcher  Provider Notification  Provider Name/Title Dr. Arlean Hopping  Date Provider Notified 09/05/23  Time Provider Notified 0040  Method of Notification Page (Secure chat)  Notification Reason Fall  Follow Up  Family notified Yes - comment  Time family notified 0250  Additional tests No  Progress note created (see row info) Yes  Blank note created Yes  Adult Fall Risk Assessment  Risk Factor Category (scoring not indicated) High fall risk per protocol (document High fall risk)  Age 63  Fall History: Fall within 6 months prior to admission 0  Elimination; Bowel and/or Urine Incontinence 0  Elimination; Bowel and/or Urine Urgency/Frequency 0  Medications: includes PCA/Opiates, Anti-convulsants, Anti-hypertensives, Diuretics, Hypnotics, Laxatives, Sedatives, and Psychotropics 3  Patient Care Equipment 2  Mobility-Assistance 2  Mobility-Gait 2  Mobility-Sensory Deficit 2  Altered awareness of immediate physical environment 0  Impulsiveness 2  Lack of understanding of one's physical/cognitive limitations 0  Total Score 14  Patient Fall Risk Level High fall risk  Adult Fall Risk Interventions  Required Bundle Interventions *See Row Information* High fall risk - low, moderate, and high requirements implemented  Additional Interventions Reorient/diversional activities with confused patients;Use of appropriate toileting equipment (bedpan, BSC, etc.)  Fall intervention(s) refused/Patient educated regarding refusal Bed alarm;Open door if unsupervised;Yellow bracelet;Supervision while toileting/edge of bed sitting  Screening for Fall Injury Risk (To be completed on HIGH fall risk patients) - Assessing Need for Floor Mats  Risk For Fall Injury- Criteria for Floor Mats Noncompliant with safety precautions  Will  Implement Floor Mats Yes  Vitals  Temp 98.6 F (37 C)  Temp Source Oral  BP 109/65  MAP (mmHg) 76  BP Location Left Arm  BP Method Automatic  Patient Position (if appropriate) Lying  Pulse Rate 93  Pulse Rate Source Monitor  Resp 20  Oxygen Therapy  SpO2 98 %  O2 Device Room Air  Neurological  Level of Consciousness Alert

## 2023-09-05 NOTE — Progress Notes (Addendum)
 [facility] night cross cover note:   I was notified by RN that the patient experienced a ground-level mechanical fall, landing on coccyx.  Did not hit his head as a component of this fall, which was not associated with any loss of consciousness.  He denies any new discomfort as result of this fall.  While the patient denies any new pain as result of the above fall, he conveys poor baseline pain control in spite of his current pain regimen includes long-acting oxycodone 15 mg p.o. every 12 hours as well as oxycodone IR 5 mg p.o. every 6 hours as needed in addition to prn acetaminophen.  I subsequently placed order for Dilaudid 0.5 mg IV x 1 dose now.   Update: pt conveys that pain has improved slightly following 0.5 mg IV x 1 dose, but remains significant at this time. Requests additional pain medication as well as a medication for anxiety at this time.   I subsequently ordered an additional dose of Dilaudid 0.5 mg IV x 1 dose now as well as Ativan 1 mg IV x 1 dose now.  I also placed an order for Haldol 5 mg IV x 1 dose prn for agitation.     Newton Pigg, DO Hospitalist

## 2023-09-05 NOTE — Progress Notes (Signed)
 Patient has been screaming for pain medication.  He was given his scheduled Oxicontin. About 3 minutes ater, he asked for pain medication and because it was too early for the PRN med, tylenol was given.  At the same time he yelling and demanding for food and drinks.  A total or three frozen dinner, countless graham crackers with peanut butter and drinks was given.  He continued to scream and yell to the point where other patients were complaining that they cannot rest.  Patient was educated on the noise and the disturbance to other patients and he verbalized understanding and promised to be quiet.  A condition was established that if he continue to yell and scream, his door will be closed and he agreed but he continued with same.  He continued to call out and this time he requested to sit in the recliner because he cannot breath and that the bed is uncomfortable.  Chair alarm was activated and patient placed on the recliner.  Shortly after he yelled that he was on the floor and we rann to bedside and found him sitting on his butt.  He denied hitting his head and any new pain.  Vital sign and blood sugar check was within normal limit.  Dr. Arlean Hopping was notified and at bedside.  PRN pain med and one time ativan and haldol ordered.  Patient is resting in bed at this time.  Will continue to monitor.

## 2023-09-06 DIAGNOSIS — G9341 Metabolic encephalopathy: Secondary | ICD-10-CM | POA: Diagnosis not present

## 2023-09-06 LAB — BASIC METABOLIC PANEL
Anion gap: 9 (ref 5–15)
BUN: 15 mg/dL (ref 8–23)
CO2: 23 mmol/L (ref 22–32)
Calcium: 8.6 mg/dL — ABNORMAL LOW (ref 8.9–10.3)
Chloride: 96 mmol/L — ABNORMAL LOW (ref 98–111)
Creatinine, Ser: 1.25 mg/dL — ABNORMAL HIGH (ref 0.61–1.24)
GFR, Estimated: >60 mL/min (ref >60)
Glucose, Bld: 123 mg/dL — ABNORMAL HIGH (ref 70–99)
Potassium: 4.3 mmol/L (ref 3.5–5.1)
Sodium: 128 mmol/L — ABNORMAL LOW (ref 135–145)

## 2023-09-06 LAB — CBC
HCT: 27.1 % — ABNORMAL LOW (ref 39.0–52.0)
Hemoglobin: 9 g/dL — ABNORMAL LOW (ref 13.0–17.0)
MCH: 29.8 pg (ref 26.0–34.0)
MCHC: 33.2 g/dL (ref 30.0–36.0)
MCV: 89.7 fL (ref 80.0–100.0)
Platelets: 260 10*3/uL (ref 150–400)
RBC: 3.02 MIL/uL — ABNORMAL LOW (ref 4.22–5.81)
RDW: 15.5 % (ref 11.5–15.5)
WBC: 5.9 10*3/uL (ref 4.0–10.5)
nRBC: 0 % (ref 0.0–0.2)

## 2023-09-06 LAB — TYPE AND SCREEN
ABO/RH(D): O POS
Antibody Screen: NEGATIVE
Unit division: 0

## 2023-09-06 LAB — GLUCOSE, CAPILLARY
Glucose-Capillary: 106 mg/dL — ABNORMAL HIGH (ref 70–99)
Glucose-Capillary: 100 mg/dL — ABNORMAL HIGH (ref 70–99)
Glucose-Capillary: 123 mg/dL — ABNORMAL HIGH (ref 70–99)
Glucose-Capillary: 100 mg/dL — ABNORMAL HIGH (ref 70–99)
Glucose-Capillary: 145 mg/dL — ABNORMAL HIGH (ref 70–99)
Glucose-Capillary: 124 mg/dL — ABNORMAL HIGH (ref 70–99)
Glucose-Capillary: 116 mg/dL — ABNORMAL HIGH (ref 70–99)

## 2023-09-06 LAB — BPAM RBC
Blood Product Expiration Date: 202504162359
ISSUE DATE / TIME: 202503201601
Unit Type and Rh: 5100

## 2023-09-06 MED ORDER — HYDROMORPHONE HCL 1 MG/ML IJ SOLN
0.5000 mg | INTRAMUSCULAR | Status: DC | PRN
  Administered 2023-09-06 – 2023-09-08 (×9): 0.5 mg via INTRAVENOUS
  Filled 2023-09-06 (×9): qty 0.5

## 2023-09-06 MED ORDER — ORAL CARE MOUTH RINSE: 15.0000 mL | OROMUCOSAL | Status: DC | PRN

## 2023-09-06 MED ORDER — OXYMETAZOLINE HCL 0.05 % NA SOLN
1.0000 | Freq: Two times a day (BID) | NASAL | Status: DC
  Administered 2023-09-06 – 2023-09-08 (×3): 1 via NASAL
  Filled 2023-09-06: qty 30

## 2023-09-06 MED ORDER — AMLODIPINE BESYLATE 5 MG PO TABS
5.0000 mg | ORAL_TABLET | Freq: Every day | ORAL | Status: DC
  Administered 2023-09-06 – 2023-09-08 (×3): 5 mg via ORAL
  Filled 2023-09-06 (×3): qty 1

## 2023-09-06 NOTE — Plan of Care (Signed)
 Discussed with patient during shift change his pain medications which RN gave.  Also discussed plan of care for the evening which would have him not screaming which could be heard at the front desk.  First shift had security and the police come up to explain it to him since several other patients complained.   He has been verbally abusive to nursing staff trying to help him as well.  His door was shut for that reason.  Patient was given a second urinal as to not spill it in the bed which didn't work.  Patient had spilled his urinal on the floor, placed urinal on bed but continued to urinate on his bed outside the urinal.  A condom catheter was tried yesterday without success.  Patient agreed to wear a primo-fit to help with his incontinence since we don't provide or use pull-ups as per hospital policy.    Patient as well would spill water in the bed and on the floor since he only wanted regular Coke but is a Diabetic.  He is on a carb modified/heart healthy diet per MD.    Patient had been advised about him constantly yelling out and no using the call bell.  And not to pull the call bell out of the wall by placing under his leg to them move down and cord was underneath him.  I reoriented and remind him several times to keep on his table for use.    We did have discussion about a schedule for even his as needed pain medications which was written on his board the next times he could get. When he did get a dose of Dilaudid he rang several times right afterwards asking if he got it.  His appearance when walking in the room was half awake and half falling asleep.  Prior a discussion about his goals which are to be out of here for daughter's birthday who lives in [place].  Attempted from his room with no success and then from the nurse phone to call his other daughter [name] @ [phone]. (Left a message with his room number to call back at [phone].   Patient admitted that he has no patience and is  lonely.  Patient continues to disturb other sleeping patients by yelling his needs instead of using his call bell.  Will continue to reorient patient.  Problem: Education: Goal: Ability to describe self-care measures that may prevent or decrease complications (Diabetes Survival Skills Education) will improve Outcome: Not Progressing   Problem: Coping: Goal: Ability to adjust to condition or change in health will improve Outcome: Not Progressing   Problem: Health Behavior/Discharge Planning: Goal: Ability to identify and utilize available resources and services will improve Outcome: Not Progressing   Problem: Elimination: Goal: Will not experience complications related to urinary retention Outcome: Not Progressing   Problem: Pain Managment: Goal: General experience of comfort will improve and/or be controlled Outcome: Not Progressing

## 2023-09-06 NOTE — NC FL2 (Signed)
 [place] MEDICAID FL2 LEVEL OF CARE FORM     IDENTIFICATION  Patient Name: Gregory [name] Birthdate: Dec 09, 1960 Sex: male Admission Date (Current Location): 09/02/2023  [facility] and [place] Number:  Producer, television/film/video and Address:  [facility] [place], [address] [place], [place] [address]      Provider Number: [id number]  Attending Physician Name and Address:  Elgergawy, Leana Roe, MD  Relative Name and Phone Number:       Current Level of Care: Hospital Recommended Level of Care: Skilled Nursing Facility Prior Approval Number:    Date Approved/Denied:   PASRR Number: [id number]  Discharge Plan: SNF    Current Diagnoses: Patient Active Problem List   Diagnosis Date Noted   Acute metabolic encephalopathy 09/02/2023   Agitation 08/03/2023   Persistent hyperactive delirium due to multiple etiologies 08/02/2023   Malnutrition of moderate degree 07/26/2023   Acute encephalopathy 07/16/2023   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) 07/03/2023   Fever 06/28/2023   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   Altered mental status 02/08/2023   Abscess of abdominal cavity (HCC) 02/08/2023   Protein-calorie malnutrition, severe 12/19/2022   Occipital stroke (HCC) 11/27/2022   Skin ulcer of left ankle, limited to breakdown of skin (HCC) 09/19/2022   Stroke-like symptom 09/18/2022   Diabetic ulcer of lower leg (HCC) 09/18/2022   Constipation 09/18/2022   Shortness of breath 09/07/2022   Symptomatic anemia 09/05/2022   Chronic hypoxic respiratory failure (HCC) 09/05/2022   Respiratory failure (HCC) 08/22/2022   Pressure injury of skin 08/22/2022   Influenza A 08/22/2022   Sepsis (HCC) 08/22/2022   Pneumonia 05/01/2022   Right upper lobe pneumonia 09/05/2021   Hyponatremia 09/05/2021   Anxiety 09/05/2021   BPH (benign prostatic hyperplasia) 09/05/2021   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   Asymptomatic  bacteriuria 06/30/2021   Normocytic anemia 06/29/2021   Pain of amputation stump of right lower extremity (HCC) 03/15/2021   Melanoma of cheek (HCC) 10/15/2019   Acute hypoxic respiratory failure (HCC) 07/11/2019   Acute respiratory disease due to COVID-19 virus 07/11/2019   Wound infection 06/22/2019   Abscess 06/22/2019   Chronic kidney disease, stage 3b (HCC) 05/23/2019   Tobacco abuse 05/23/2019   Cellulitis 05/23/2019   S/P AKA (above knee amputation) unilateral, right (HCC) 12/05/2018   Amputation stump infection (HCC)    Lymphedema of left leg    Diabetic polyneuropathy associated with type 2 diabetes mellitus (HCC)    Above knee amputation of right lower extremity (HCC) 10/22/2018   Dehiscence of amputation stump (HCC)    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   Candidiasis 08/12/2018   Necrosis of toe (HCC) 07/08/2018   Amputated toe of left foot (HCC) 06/18/2018   Streptococcal infection    Pressure injury, stage 3 (HCC) 06/06/2018   Cutaneous abscess of left foot    Moderate protein-calorie malnutrition (HCC)    Cellulitis of left lower extremity    Septic arthritis (HCC) 06/03/2018   Idiopathic chronic venous hypertension of left lower extremity with ulcer and inflammation (HCC) 11/14/2017   Great toe amputation status, left 11/14/2017   Enlarged lymph node in neck 07/18/2017   Malignant melanoma of face (HCC) 04/16/2017   Osteomyelitis (HCC) 03/25/2017   Skin cancer 03/25/2017   Depression 11/05/2016   Diabetic ulcer of toe of left foot (HCC) 10/02/2016   Epidural abscess 10/02/2016   Chronic pain 09/27/2016   DM2 (diabetes mellitus,  type 2) (HCC) 06/06/2016   GERD without esophagitis 05/11/2016   Hypertensive heart disease with congestive heart failure (HCC) 12/15/2015   Spinal stenosis in cervical region 12/15/2015   Type II diabetes mellitus with neurological manifestations (HCC) 12/15/2015   Chronic diastolic HF (heart failure) (HCC)     Pulmonary hypertension (HCC)    HTN (hypertension)    HLD (hyperlipidemia)    Urinary retention    Diskitis    Foot drop, bilateral    Sepsis with acute renal failure without septic shock (HCC)    COPD (chronic obstructive pulmonary disease) (HCC) 10/03/2015   Acute osteomyelitis of left foot (HCC) 10/03/2015   MRSA bacteremia 08/13/2015   Chronic low back pain 08/13/2015   Chronic multifocal osteomyelitis, multiple sites (HCC) 08/13/2015   Community acquired pneumonia 08/13/2015   Encephalopathy 03/25/2012    Orientation RESPIRATION BLADDER Height & Weight     Self, Time, Place  Normal Continent, External catheter Weight: 200 lb 2.8 oz (90.8 kg) (3/18@0500 ) Height:  6' (182.9 cm)  BEHAVIORAL SYMPTOMS/MOOD NEUROLOGICAL BOWEL NUTRITION STATUS      Ileostomy, Continent Diet (See DC Summary)  AMBULATORY STATUS COMMUNICATION OF NEEDS Skin   Extensive Assist Verbally Other (Comment) (Pressure wound on sacrum; venous stasis ulcer on left leg)                       Personal Care Assistance Level of Assistance  Bathing, Dressing, Feeding Bathing Assistance: Maximum assistance Feeding assistance: Limited assistance Dressing Assistance: Maximum assistance     Functional Limitations Info  Hearing, Sight, Speech Sight Info: Impaired Hearing Info: Adequate Speech Info: Adequate    SPECIAL CARE FACTORS FREQUENCY          Contractures Contractures Info: Not present    Additional Factors Info  Code Status, Allergies, Isolation Precautions Code Status Info: Full Allergies Info: Latex     Isolation Precautions Info: ESBL, MRSA     Current Medications (09/06/2023):  This is the current hospital active medication list Current Facility-Administered Medications  Medication Dose Route Frequency Provider Last Rate Last Admin   (feeding supplement) PROSource Plus liquid 30 mL  30 mL Oral BID BM Elgergawy, Leana Roe, MD   30 mL at 09/05/23 1407   0.9 %  sodium chloride infusion  (Manually program via Guardrails IV Fluids)   Intravenous Once Elgergawy, Leana Roe, MD   Held at 09/05/23 1407   acetaminophen (TYLENOL) tablet 650 mg  650 mg Oral Q6H PRN Russella Dar, NP   650 mg at 09/06/23 1017   Or   acetaminophen (TYLENOL) suppository 650 mg  650 mg Rectal Q6H PRN Russella Dar, NP       amLODipine (NORVASC) tablet 5 mg  5 mg Oral Daily Elgergawy, Leana Roe, MD   5 mg at 09/06/23 1451   aspirin EC tablet 81 mg  81 mg Oral Daily Hughie Closs, MD   81 mg at 09/06/23 5102   atorvastatin (LIPITOR) tablet 80 mg  80 mg Oral Daily Hughie Closs, MD   80 mg at 09/06/23 5852   cholecalciferol (VITAMIN D3) 25 MCG (1000 UNIT) tablet 2,000 Units  2,000 Units Oral Daily Hughie Closs, MD   2,000 Units at 09/06/23 7782   divalproex (DEPAKOTE) DR tablet 125 mg  125 mg Oral TID Elgergawy, Leana Roe, MD   125 mg at 09/06/23 1124   docusate sodium (COLACE) capsule 100 mg  100 mg Oral Q12H PRN Hughie Closs, MD  escitalopram (LEXAPRO) tablet 20 mg  20 mg Oral Daily Hughie Closs, MD   20 mg at 09/06/23 2536   feeding supplement (ENSURE ENLIVE / ENSURE PLUS) liquid 237 mL  237 mL Oral TID BM Elgergawy, Leana Roe, MD   237 mL at 09/05/23 1407   ferrous sulfate tablet 325 mg  325 mg Oral TID WC Hughie Closs, MD   325 mg at 09/06/23 1124   fluticasone (FLONASE) 50 MCG/ACT nasal spray 2 spray  2 spray Each Nare Daily Elgergawy, Leana Roe, MD   2 spray at 09/06/23 6440   HYDROmorphone (DILAUDID) injection 0.5 mg  0.5 mg Intravenous Q4H PRN Elgergawy, Leana Roe, MD   0.5 mg at 09/06/23 1505   insulin aspart (novoLOG) injection 0-15 Units  0-15 Units Subcutaneous Q4H Russella Dar, NP   2 Units at 09/05/23 0946   ipratropium-albuterol (DUONEB) 0.5-2.5 (3) MG/3ML nebulizer solution 3 mL  3 mL Nebulization Q6H PRN Russella Dar, NP       ipratropium-albuterol (DUONEB) 0.5-2.5 (3) MG/3ML nebulizer solution 3 mL  3 mL Nebulization Q6H PRN Pahwani, Daleen Bo, MD   3 mL at 09/03/23 1144   lactulose  (CHRONULAC) 10 GM/15ML solution 20 g  20 g Oral BID Hughie Closs, MD   20 g at 09/05/23 0946   leptospermum manuka honey (MEDIHONEY) paste 1 Application  1 Application Topical Daily Hughie Closs, MD   1 Application at 09/06/23 1522   naloxone (NARCAN) injection 0.4 mg  0.4 mg Intravenous PRN John Giovanni, MD       OLANZapine (ZYPREXA) tablet 15 mg  15 mg Oral BID Elgergawy, Leana Roe, MD   15 mg at 09/06/23 0808   Oral care mouth rinse  15 mL Mouth Rinse PRN Elgergawy, Leana Roe, MD       OXcarbazepine (TRILEPTAL) tablet 150 mg  150 mg Oral BID Hughie Closs, MD   150 mg at 09/06/23 1124   oxyCODONE (Oxy IR/ROXICODONE) immediate release tablet 5 mg  5 mg Oral Q6H PRN Elgergawy, Leana Roe, MD   5 mg at 09/06/23 1124   oxyCODONE (OXYCONTIN) 12 hr tablet 15 mg  15 mg Oral Q12H Pahwani, Daleen Bo, MD   15 mg at 09/06/23 3474   pregabalin (LYRICA) capsule 150 mg  150 mg Oral BID John Giovanni, MD   150 mg at 09/06/23 2595   sodium chloride flush (NS) 0.9 % injection 3 mL  3 mL Intravenous Q12H Russella Dar, NP   3 mL at 09/06/23 0817   tamsulosin (FLOMAX) capsule 0.4 mg  0.4 mg Oral Daily Hughie Closs, MD   0.4 mg at 09/06/23 0808     Discharge Medications: Please see discharge summary for a list of discharge medications.  Relevant Imaging Results:  Relevant Lab Results:   Additional Information SSN: [id number]  Mearl Latin, LCSW

## 2023-09-06 NOTE — Progress Notes (Signed)
 Patient woke up screaming down the hallway.  When approaching him in the room he continued to yell and be verbally abusive demanding his Flonase now which is schedule for 1000 today.  He was given his alternative nasal spray but refused.    Then he wanted to order breakfast in which last night and this morning let him know they aren't open til 0630 to order and we would help him.    He stated that he would continue to press his call bell until and scream til he get what he wants.  He has asked for his door open but can be heard at the main desk.  Several patients are still sleeping at this time. He door has been kept shut at this time.

## 2023-09-06 NOTE — Progress Notes (Addendum)
 PROGRESS NOTE    Gregory [name]  ZOX:[id number] DOB: 1961/05/13 DOA: 09/02/2023 PCP: [name], Provider, MD   Brief Narrative:    Gregory [name] is a 63 y.o. 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  Patient was hospitalized January and early March 2025 with somnolence felt to be multifactorial secondary to polypharmacy and hyponatremia.  Adjustments were made in his psychotropic and narcotic medications with additional recommendations for patient to follow-up with psychiatry to adjust psychotropic medications if somnolence or altered mentation persisted.  Reportedly patient was found slumped over the wheelchair with altered mentation/somnolence 30 minutes prior to arrival.  Apparently right before this the patient was alert and talking.  According to staff patient has a history of using nonprescribed medications from his family.  It is also noted per the nursing facility staff that his ostomy was reported to be protruding more than normal.  Upon arrival his room air sat was 88% and he was placed on 5 L O2 via mask with O2 sats 98%.  Glucose was 174.  Vital signs were otherwise stable.   While in the ER stat CT of the head was obtained that did not demonstrate any new or acute intracranial abnormalities.  Venous ABG was unremarkable.  Sodium was 123, potassium was 5.7, CO2 was 21 with a normal anion gap, creatinine was 1.32.  Urinalysis was unremarkable.  Alcohol level was <10 and a urine drug screen was positive for THC.  Patient remained mostly somnolent.  Eventually admitted to hospital service.  Assessment & Plan:   Principal Problem:   Acute metabolic encephalopathy  Acute metabolic  encephalopathy with history of recurrent delirium and encephalopathy, POA:  -Likely multifactorial.  Polypharmacy vs hyponatremia vs dehydration.  As well urine drug screen positive for THC CT head unremarkable, ammonia normal. -Patient has improved after holding all narcotics, psychotropics and neuroleptic sedative medication, but given he is back to his baseline of increased restlessness and agitation he had to be resumed him back on these meds gradually.   Recurrent hypovolemic hyponatremia:  -Has history of hyponatremia in the past.  Appears to be hypovolemic clinically.  Presented with 123 which has been stable.  -Improving, 126 today.  Prolapsed ileostomy stoma:  -Seen by general surgery and stoma reduced at bedside in the ED. does appear to be mildly prolapsed today again as well, which is producing stool, have no complaints, discussed with general surgery, no pain, and producing pain, no evidence of swelling or erythema then thing to do.  Hyperkalemia:  -Resolved with Lokelma  CKD stage IIIa:  -Baseline creatinine appears to be between 1.4-1.6.  Currently better than baseline.  Monitor.  Anemia of chronic disease:  -Baseline hemoglobin is around 9, has dropped during hospital stay to 7.3, received 1 unit PRBC yesterday, good response with hemoglobin of 9 this morning, - no evidence of GI bleed, stool with normal color and ileostomy.  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  Appears to be chronic.  Wound care following.  Chronic pain right AKA stump:  -Apparently patient was on OxyContin 15 mg every 12 hours as well as Lyrica and he is also on multiple psychotropic medications.  Location has been  resumed gradually  Type 2 diabetes mellitus:  -Recent hemoglobin A1c was 6.4 in January.  Patient was discharged on 5 units of Lantus however this was bumped up to 15 units at nursing home.  Keep on insulin sliding  scale.  History of right PCA embolic CVA January 2025: Will continue aspirin and statin.  COPD: Stable.  Continue DuoNeb.  Depression: Continue Lexapro.  Hypertension -Blood pressure persistently elevated on as needed hydralazine, started on 5 mg of amlodipine  BPH with history of recurrent urinary retention: Doing well currently.  Continue Flomax.  DVT prophylaxis:    Code Status: Full Code  Family Communication: discussed with daughter by phone  Status is: Inpatient The patient will require care spanning > 2 midnights and should be moved to inpatient because: Patient is still encephalopathic with electrolyte abnormalities.  Needs inpatient management.   Estimated body mass index is 27.15 kg/m as calculated from the following:   Height as of this encounter: 6' (1.829 m).   Weight as of this encounter: 90.8 kg.  Pressure Injury 07/16/23 Sacrum Medial Stage 1 -  Intact skin with non-blanchable redness of a localized area usually over a bony prominence. non-blanchable redness to sacrum (Active)  07/16/23 0645  Location: Sacrum  Location Orientation: Medial  Staging: Stage 1 -  Intact skin with non-blanchable redness of a localized area usually over a bony prominence.  Wound Description (Comments): non-blanchable redness to sacrum  Present on Admission: Yes  Dressing Type Foam - Lift dressing to assess site every shift 09/04/23 1945   Nutritional Assessment: Body mass index is 27.15 kg/m.Marland Kitchen Seen by dietician.  I agree with the assessment and plan as outlined below: Nutrition Status:        . Skin Assessment: I have examined the patient's skin and I agree with the wound assessment as performed by the wound care RN as outlined below: Pressure Injury 07/16/23 Sacrum Medial Stage 1 -  Intact skin with non-blanchable redness of a localized area usually over a bony prominence. non-blanchable redness to sacrum (Active)  07/16/23 0645  Location: Sacrum  Location Orientation:  Medial  Staging: Stage 1 -  Intact skin with non-blanchable redness of a localized area usually over a bony prominence.  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  Objective: Vitals:   09/06/23 0000 09/06/23 0200 09/06/23 0550 09/06/23 0553  BP:    (!) 170/59  Pulse:  71 76 68  Resp:  16 18 20   Temp:   98.2 F (36.8 C)   TempSrc:   Oral   SpO2:  93% 96% 97%  Weight: 90.8 kg     Height: 6' (1.829 m)       Intake/Output Summary (Last 24 hours) at 09/06/2023 1343 Last data filed at 09/06/2023 1100 Gross per 24 hour  Intake 1725.83 ml  Output 4000 ml  Net -2274.17 ml   Filed Weights   09/06/23 0000  Weight: 90.8 kg    Examination:    Awake alert, in no apparent distress Symmetrical Chest wall movement, Good air movement bilaterally, CTAB RRR,No Gallops,Rubs or new Murmurs, No Parasternal Heave +ve B.Sounds,  Abd Soft, ostomy present with brown color stool, with mildly prolapsed stoma, but no swelling or pain Right BKA, left foot with multiple toe amputation with some ulceration   Data Reviewed: I have personally reviewed following labs and imaging studies  CBC: Recent Labs  Lab 09/02/23 1103 09/02/23 1113 09/03/23 0638 09/05/23 0806 09/06/23 0935  WBC 7.8  --  8.2 5.5 5.9  NEUTROABS 5.4  --   --   --   --   HGB 8.2* 8.5* 7.6* 7.3* 9.0*  HCT 24.7* 25.0* 23.2* 22.2* 27.1*  MCV 91.8  --  90.3 90.2 89.7  PLT 258  --  249 251 260   Basic Metabolic Panel: Recent Labs  Lab 09/02/23 1630 09/02/23 2320 09/03/23 0638 09/05/23 0806 09/06/23 0935  NA 124* 122* 124* 126* 128*  K 5.8* 5.0 4.8 4.5 4.3  CL 96*  93* 95* 97* 96*  CO2 23 21* 21* 19* 23  GLUCOSE 103* 182* 82 149* 123*  BUN 13 17 12 12 15   CREATININE 1.35* 1.25* 1.17 1.11 1.25*  CALCIUM 8.2* 8.2* 8.1* 8.0* 8.6*   GFR: Estimated Creatinine Clearance: 65.5 mL/min (A) (by C-G formula based on SCr of 1.25 mg/dL (H)). Liver Function Tests: Recent Labs  Lab 09/02/23 1103 09/03/23 0638  AST 15 11*  ALT 10 8  ALKPHOS 81 74  BILITOT 0.7 0.4  PROT 5.9* 5.5*  ALBUMIN 2.5* 2.3*   No results for input(s): "LIPASE", "AMYLASE" in the last 168 hours. Recent Labs  Lab 09/02/23 1103  AMMONIA 33   Coagulation Profile: No results for input(s): "INR", "PROTIME" in the last 168 hours. Cardiac Enzymes: No results for input(s): "CKTOTAL", "CKMB", "CKMBINDEX", "TROPONINI" in the last 168 hours. BNP (last 3 results) No results for input(s): "PROBNP" in the last 8760 hours. HbA1C: No results for input(s): "HGBA1C" in the last 72 hours. CBG: Recent Labs  Lab 09/05/23 1956 09/05/23 2327 09/06/23 0329 09/06/23 0619 09/06/23 0856  GLUCAP 120* 80 106* 100* 123*   Lipid Profile: No results for input(s): "CHOL", "HDL", "LDLCALC", "TRIG", "CHOLHDL", "LDLDIRECT" in the last 72 hours. Thyroid Function Tests: No results for input(s): "TSH", "T4TOTAL", "FREET4", "T3FREE", "THYROIDAB" in the last 72 hours. Anemia Panel: No results for input(s): "VITAMINB12", "FOLATE", "FERRITIN", "TIBC", "IRON", "RETICCTPCT" in the last 72 hours. Sepsis Labs: Recent Labs  Lab 09/02/23 1113  LATICACIDVEN 0.4*    No results found for this or any previous visit (from the past 240 hours).   Radiology Studies: No results found.   Scheduled Meds:  (feeding supplement) PROSource Plus  30 mL Oral BID BM   sodium chloride   Intravenous Once   aspirin EC  81 mg Oral Daily   atorvastatin  80 mg Oral Daily   cholecalciferol  2,000 Units Oral Daily   divalproex  125 mg Oral TID   escitalopram  20 mg Oral Daily   feeding supplement  237 mL Oral TID BM    ferrous sulfate  325 mg Oral TID WC   fluticasone  2 spray Each Nare Daily   insulin aspart  0-15 Units Subcutaneous Q4H   lactulose  20 g Oral BID   leptospermum manuka honey  1 Application Topical Daily   OLANZapine  15 mg Oral BID   OXcarbazepine  150 mg Oral BID   oxyCODONE  15 mg Oral Q12H   pregabalin  150 mg Oral BID   sodium chloride flush  3 mL Intravenous Q12H   tamsulosin  0.4 mg Oral  Daily   Continuous Infusions:  sodium chloride Stopped (09/05/23 0924)     LOS: 3 days   Huey Bienenstock, MD [facility]  09/06/2023, 1:43 PM   *Please note that this is a verbal dictation therefore any spelling or grammatical errors are due to the "Dragon [employer] One" system interpretation.  Please page via Amion and do not message via secure chat for urgent patient care matters. Secure chat can be used for non urgent patient care matters.  How to contact the [facility] Attending or Consulting provider 7A - 7P or covering provider during after hours 7P -7A, for this patient?  Check the care team in [facility] and look for a) attending/consulting TRH provider listed and b) the [facility] team listed. Page or secure chat 7A-7P. Log into [email or web address] and use [facility] universal password to access. If you do not have the password, please contact the hospital operator. Locate the [facility] provider you are looking for under [facility] and page to a number that you can be directly reached. If you still have difficulty reaching the provider, please page the [facility] (Director on Call) for the Hospitalists listed on amion for assistance.

## 2023-09-06 NOTE — TOC Progression Note (Signed)
 Transition of Care [facility]) - Progression Note    Patient Details  Name: Gregory [name] MRN: [id number] Date of Birth: 04-28-1961  Transition of Care [facility] CM/SW Contact  Mearl Latin, LCSW Phone Number: 09/06/2023, 12:39 PM  Clinical Narrative:    Per Wadie Lessen they do not have a bed available today but are hopeful to have one available tomorrow. Updated MD.    Expected Discharge Plan: Skilled Nursing Facility Barriers to Discharge: Continued Medical Work up, SNF Pending bed offer  Expected Discharge Plan and Services In-house Referral: Clinical Social Work   Post Acute Care Choice: Skilled Nursing Facility Living arrangements for the past 2 months: Skilled Nursing Facility                                       Social Determinants of Health (SDOH) Interventions SDOH Screenings   Food Insecurity: No Food Insecurity (09/03/2023)  Housing: Low Risk  (09/03/2023)  Transportation Needs: No Transportation Needs (09/03/2023)  Utilities: Not At Risk (09/03/2023)  Depression (PHQ2-9): Low Risk  (03/08/2021)  Tobacco Use: High Risk (09/02/2023)    Readmission Risk Interventions    09/04/2023    1:49 PM 09/06/2022    1:48 PM 08/27/2022   12:46 PM  Readmission Risk Prevention Plan  Transportation Screening Complete Complete Complete  Medication Review Oceanographer) Complete Complete Complete  PCP or Specialist appointment within 3-5 days of discharge Complete Complete Complete  HRI or Home Care Consult Complete Complete Complete  SW Recovery Care/Counseling Consult Complete Complete Complete  Palliative Care Screening Not Applicable Not Applicable Not Applicable  Skilled Nursing Facility Complete Complete Complete

## 2023-09-07 DIAGNOSIS — G9341 Metabolic encephalopathy: Secondary | ICD-10-CM | POA: Diagnosis not present

## 2023-09-07 LAB — GLUCOSE, CAPILLARY
Glucose-Capillary: 139 mg/dL — ABNORMAL HIGH (ref 70–99)
Glucose-Capillary: 100 mg/dL — ABNORMAL HIGH (ref 70–99)
Glucose-Capillary: 140 mg/dL — ABNORMAL HIGH (ref 70–99)
Glucose-Capillary: 86 mg/dL (ref 70–99)
Glucose-Capillary: 138 mg/dL — ABNORMAL HIGH (ref 70–99)
Glucose-Capillary: 142 mg/dL — ABNORMAL HIGH (ref 70–99)

## 2023-09-07 MED ORDER — ZIPRASIDONE MESYLATE 20 MG IM SOLR: 20.0000 mg | Freq: Once | INTRAMUSCULAR | Status: DC | PRN

## 2023-09-07 MED ORDER — HALOPERIDOL LACTATE 5 MG/ML IJ SOLN: 5.0000 mg | Freq: Once | INTRAMUSCULAR | Status: AC

## 2023-09-07 MED ORDER — FUROSEMIDE 10 MG/ML IJ SOLN
40.0000 mg | Freq: Once | INTRAMUSCULAR | Status: AC
  Administered 2023-09-07: 40 mg via INTRAVENOUS
  Filled 2023-09-07: qty 4

## 2023-09-07 MED ORDER — QUETIAPINE FUMARATE 25 MG PO TABS
25.0000 mg | ORAL_TABLET | Freq: Once | ORAL | Status: AC
  Administered 2023-09-07: 25 mg via ORAL
  Filled 2023-09-07: qty 1

## 2023-09-07 MED ORDER — HALOPERIDOL LACTATE 5 MG/ML IJ SOLN
INTRAMUSCULAR | Status: AC
  Administered 2023-09-07: 5 mg via INTRAVENOUS
  Filled 2023-09-07: qty 1

## 2023-09-07 MED ORDER — LORAZEPAM 2 MG/ML IJ SOLN
1.0000 mg | Freq: Once | INTRAMUSCULAR | Status: AC | PRN
  Administered 2023-09-08: 1 mg via INTRAVENOUS
  Filled 2023-09-07: qty 1

## 2023-09-07 NOTE — Progress Notes (Signed)
 Agitated, belligerent, disrespectful to staff. Uncooperative , loud and keeping other patients up. Multiple request every 5 minutes . Given all med and pain meds, insisting he hasn't received his meds. Discussed with on call MD for bedside eval.

## 2023-09-07 NOTE — Progress Notes (Signed)
 PROGRESS NOTE    Gregory [name]  ZOX:[id number] DOB: 08-Apr-1961 DOA: 09/02/2023 PCP: [name], Provider, MD   Brief Narrative:    Gregory [name] is a 63 y.o. 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  Patient was hospitalized January and early March 2025 with somnolence felt to be multifactorial secondary to polypharmacy and hyponatremia.  Adjustments were made in his psychotropic and narcotic medications with additional recommendations for patient to follow-up with psychiatry to adjust psychotropic medications if somnolence or altered mentation persisted.  Reportedly patient was found slumped over the wheelchair with altered mentation/somnolence 30 minutes prior to arrival.  Apparently right before this the patient was alert and talking.  According to staff patient has a history of using nonprescribed medications from his family.  It is also noted per the nursing facility staff that his ostomy was reported to be protruding more than normal.  Upon arrival his room air sat was 88% and he was placed on 5 L O2 via mask with O2 sats 98%.  Glucose was 174.  Vital signs were otherwise stable.   While in the ER stat CT of the head was obtained that did not demonstrate any new or acute intracranial abnormalities.  Venous ABG was unremarkable.  Sodium was 123, potassium was 5.7, CO2 was 21 with a normal anion gap, creatinine was 1.32.  Urinalysis was unremarkable.  Alcohol level was <10 and a urine drug screen was positive for THC.  Patient remained mostly somnolent.  Eventually admitted to hospital service. -Please see discussion below regarding hospital course  Assessment & Plan:   Principal Problem:    Acute metabolic encephalopathy  Acute metabolic encephalopathy with history of recurrent delirium and encephalopathy, POA:  -Likely multifactorial.  Polypharmacy vs hyponatremia vs dehydration.  As well urine drug screen positive for THC CT head unremarkable, ammonia normal. -Patient has improved after holding all narcotics, psychotropics and neuroleptic sedative medication, but given he is back to his baseline of increased restlessness and agitation he had to be resumed him back on these meds gradually.   Recurrent hypovolemic hyponatremia:  -Has history of hyponatremia in the past.  Appears to be hypovolemic clinically.  Presented with 123 which has been stable.  -Improving, 126 today.  Prolapsed ileostomy stoma:  -Seen by general surgery and stoma reduced at bedside in the ED. does appear to be mildly prolapsed today again as well, which is producing stool, have no complaints, discussed with general surgery, no pain, and producing pain, no evidence of swelling or erythema then thing to do.  Hyperkalemia:  -Resolved with Lokelma  CKD stage IIIa:  -Baseline creatinine appears to be between 1.4-1.6.  Currently better than baseline.  Monitor.  Anemia of chronic disease:  -Baseline hemoglobin is around 9, has dropped during hospital stay to 7.3, received 1 unit PRBC yesterday, good response with hemoglobin of 9 this morning, - no evidence of GI bleed, stool with normal color and ileostomy.  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  Appears to be chronic.  Wound care following.  Chronic pain right AKA stump:  -Apparently patient was on OxyContin 15 mg every 12 hours as well as Lyrica and he is also on  multiple psychotropic medications.  Location has been resumed gradually  Type 2 diabetes mellitus:  -Recent hemoglobin A1c was 6.4 in January.  Patient was discharged on 5 units of Lantus however this was bumped up to  15 units at nursing home.  Keep on insulin sliding scale.  History of right PCA embolic CVA January 2025: Will continue aspirin and statin.  COPD: Stable.  Continue DuoNeb.  Depression: Continue Lexapro.  Hypertension -Blood pressure persistently elevated on as needed hydralazine, started on 5 mg of amlodipine  BPH with history of recurrent urinary retention: Doing well currently.  Continue Flomax.  DVT prophylaxis:    Code Status: Full Code  Family Communication: discussed with daughter by phone 3/21  Status is: Inpatient Patient is medically stable for discharge, awaiting bed availability at [facility]   Estimated body mass index is 27.15 kg/m as calculated from the following:   Height as of this encounter: 6' (1.829 m).   Weight as of this encounter: 90.8 kg.  Pressure Injury 07/16/23 Sacrum Medial Stage 1 -  Intact skin with non-blanchable redness of a localized area usually over a bony prominence. non-blanchable redness to sacrum (Active)  07/16/23 0645  Location: Sacrum  Location Orientation: Medial  Staging: Stage 1 -  Intact skin with non-blanchable redness of a localized area usually over a bony prominence.  Wound Description (Comments): non-blanchable redness to sacrum  Present on Admission: Yes  Dressing Type Foam - Lift dressing to assess site every shift 09/07/23 0900   Nutritional Assessment: Body mass index is 27.15 kg/m.Marland Kitchen Seen by dietician.  I agree with the assessment and plan as outlined below: Nutrition Status:        . Skin Assessment: I have examined the patient's skin and I agree with the wound assessment as performed by the wound care RN as outlined below: Pressure Injury 07/16/23 Sacrum Medial Stage 1 -  Intact skin with non-blanchable redness of a localized area usually over a bony prominence. non-blanchable redness to sacrum (Active)  07/16/23 0645  Location: Sacrum  Location Orientation: Medial  Staging: Stage 1 -  Intact skin with  non-blanchable redness of a localized area usually over a bony prominence.  Wound Description (Comments): non-blanchable redness to sacrum  Present on Admission: Yes  Dressing Type Foam - Lift dressing to assess site every shift 09/07/23 0900    Consultants:  General surgery  Procedures:  None  Antimicrobials:  Anti-infectives (From admission, onward)    None         Subjective:  No significant events overnight as discussed with staff, he had a good night sleep, he is having good appetite, he denies any complaints today, asking when he can go back to [employer].  Objective: Vitals:   09/06/23 0553 09/06/23 1400 09/06/23 1641 09/06/23 2051  BP: (!) 170/59 (!) 173/76 (!) 140/101 (!) 169/78  Pulse: 68 63  70  Resp: 20 14 18 15   Temp:  97.9 F (36.6 C) 97.7 F (36.5 C) 97.9 F (36.6 C)  TempSrc:  Axillary  Oral  SpO2: 97% 95% 96% 95%  Weight:      Height:        Intake/Output Summary (Last 24 hours) at 09/07/2023 1324 Last data filed at 09/07/2023 0900 Gross per 24 hour  Intake 1080 ml  Output 3200 ml  Net -2120 ml   Filed Weights   09/06/23 0000  Weight: 90.8 kg    Examination:    Awake Alert, pleasant, no apparent distress today.  Symmetrical Chest wall movement, Good air movement bilaterally, CTAB +ve B.Sounds, Abd Soft, ostomy present with brown color stool, with mildly prolapsed stoma, but no swelling or pain Right BKA, left foot with multiple toe amputation with some ulceration   Data Reviewed: I have personally reviewed following labs and imaging studies  CBC: Recent Labs  Lab 09/02/23 1103 09/02/23 1113 09/03/23 0638 09/05/23 0806 09/06/23 0935  WBC 7.8  --  8.2 5.5 5.9  NEUTROABS 5.4  --   --   --   --   HGB 8.2* 8.5* 7.6* 7.3* 9.0*  HCT 24.7* 25.0* 23.2* 22.2* 27.1*  MCV 91.8  --  90.3 90.2 89.7  PLT 258  --  249 251 260   Basic Metabolic Panel: Recent Labs  Lab 09/02/23 1630 09/02/23 2320 09/03/23 0638 09/05/23 0806  09/06/23 0935  NA 124* 122* 124* 126* 128*  K 5.8* 5.0 4.8 4.5 4.3  CL 96* 93* 95* 97* 96*  CO2 23 21* 21* 19* 23  GLUCOSE 103* 182* 82 149* 123*  BUN 13 17 12 12 15   CREATININE 1.35* 1.25* 1.17 1.11 1.25*  CALCIUM 8.2* 8.2* 8.1* 8.0* 8.6*   GFR: Estimated Creatinine Clearance: 65.5 mL/min (A) (by C-G formula based on SCr of 1.25 mg/dL (H)). Liver Function Tests: Recent Labs  Lab 09/02/23 1103 09/03/23 0638  AST 15 11*  ALT 10 8  ALKPHOS 81 74  BILITOT 0.7 0.4  PROT 5.9* 5.5*  ALBUMIN 2.5* 2.3*   No results for input(s): "LIPASE", "AMYLASE" in the last 168 hours. Recent Labs  Lab 09/02/23 1103  AMMONIA 33   Coagulation Profile: No results for input(s): "INR", "PROTIME" in the last 168 hours. Cardiac Enzymes: No results for input(s): "CKTOTAL", "CKMB", "CKMBINDEX", "TROPONINI" in the last 168 hours. BNP (last 3 results) No results for input(s): "PROBNP" in the last 8760 hours. HbA1C: No results for input(s): "HGBA1C" in the last 72 hours. CBG: Recent Labs  Lab 09/06/23 2048 09/06/23 2329 09/07/23 0508 09/07/23 0820 09/07/23 1220  GLUCAP 124* 116* 139* 100* 140*   Lipid Profile: No results for input(s): "CHOL", "HDL", "LDLCALC", "TRIG", "CHOLHDL", "LDLDIRECT" in the last 72 hours. Thyroid Function Tests: No results for input(s): "TSH", "T4TOTAL", "FREET4", "T3FREE", "THYROIDAB" in the last 72 hours. Anemia Panel: No results for input(s): "VITAMINB12", "FOLATE", "FERRITIN", "TIBC", "IRON", "RETICCTPCT" in the last 72 hours. Sepsis Labs: Recent Labs  Lab 09/02/23 1113  LATICACIDVEN 0.4*    No results found for this or any previous visit (from the past 240 hours).   Radiology Studies: No results found.   Scheduled Meds:  (feeding supplement) PROSource Plus  30 mL Oral BID BM   sodium chloride   Intravenous Once   amLODipine  5 mg Oral Daily   aspirin EC  81 mg Oral Daily   atorvastatin  80 mg Oral Daily   cholecalciferol  2,000 Units Oral Daily    divalproex  125 mg Oral TID   escitalopram  20 mg Oral Daily   feeding supplement  237 mL Oral TID BM   ferrous sulfate  325 mg Oral TID WC   fluticasone  2 spray Each Nare Daily   insulin aspart  0-15 Units Subcutaneous Q4H   lactulose  20 g Oral BID   leptospermum manuka honey  1 Application Topical Daily   OLANZapine  15 mg Oral BID   OXcarbazepine  150 mg Oral BID   oxyCODONE  15 mg Oral Q12H   oxymetazoline  1 spray  Each Nare BID   pregabalin  150 mg Oral BID   sodium chloride flush  3 mL Intravenous Q12H   tamsulosin  0.4 mg Oral Daily   Continuous Infusions:     LOS: 4 days   Huey Bienenstock, MD [facility]  09/07/2023, 1:24 PM   *Please note that this is a verbal dictation therefore any spelling or grammatical errors are due to the "Dragon Medical One" system interpretation.  Please page via Amion and do not message via secure chat for urgent patient care matters. Secure chat can be used for non urgent patient care matters.  How to contact the [facility] Attending or Consulting provider 7A - 7P or covering provider during after hours 7P -7A, for this patient?  Check the care team in [facility] and look for a) attending/consulting TRH provider listed and b) the [facility] team listed. Page or secure chat 7A-7P. Log into [email or web address] and use [facility] universal password to access. If you do not have the password, please contact the hospital operator. Locate the [facility] provider you are looking for under [facility] and page to a number that you can be directly reached. If you still have difficulty reaching the provider, please page the [facility] (Director on Call) for the Hospitalists listed on amion for assistance.

## 2023-09-07 NOTE — Plan of Care (Signed)

## 2023-09-07 NOTE — TOC Progression Note (Signed)
 Transition of Care [facility]) - Progression Note    Patient Details  Name: Gregory [name] MRN: [id number] Date of Birth: [birth year]  [facility] CM/SW Contact  Jimmy Picket, [place] Phone Number: 09/07/2023, 1:16 PM  Clinical Narrative:     Facility is not able to take patient today, and states he can come tomorrow afternoon.   Expected Discharge Plan: Skilled Nursing Facility Barriers to Discharge: Continued Medical Work up, SNF Pending bed offer  Expected Discharge Plan and Services In-house Referral: Clinical Social Work   Post Acute Care Choice: Skilled Nursing Facility Living arrangements for the past 2 months: Skilled Nursing Facility                                       Social Determinants of Health (SDOH) Interventions SDOH Screenings   Food Insecurity: No Food Insecurity (09/03/2023)  Housing: Low Risk  (09/03/2023)  Transportation Needs: No Transportation Needs (09/03/2023)  Utilities: Not At Risk (09/03/2023)  Depression (PHQ2-9): Low Risk  (03/08/2021)  Tobacco Use: High Risk (09/02/2023)    Readmission Risk Interventions    09/04/2023    1:49 PM 09/06/2022    1:48 PM 08/27/2022   12:46 PM  Readmission Risk Prevention Plan  Transportation Screening Complete Complete Complete  Medication Review Oceanographer) Complete Complete Complete  PCP or Specialist appointment within 3-5 days of discharge Complete Complete Complete  HRI or Home Care Consult Complete Complete Complete  SW Recovery Care/Counseling Consult Complete Complete Complete  Palliative Care Screening Not Applicable Not Applicable Not Applicable  Skilled Nursing Facility Complete Complete Complete

## 2023-09-07 NOTE — Plan of Care (Signed)
  Problem: Coping: Goal: Ability to adjust to condition or change in health will improve Outcome: Progressing   Problem: Fluid Volume: Goal: Ability to maintain a balanced intake and output will improve Outcome: Progressing   Problem: Metabolic: Goal: Ability to maintain appropriate glucose levels will improve Outcome: Progressing   Problem: Nutritional: Goal: Maintenance of adequate nutrition will improve Outcome: Progressing Goal: Progress toward achieving an optimal weight will improve Outcome: Progressing   Problem: Tissue Perfusion: Goal: Adequacy of tissue perfusion will improve Outcome: Progressing   Problem: Education: Goal: Knowledge of General Education information will improve Description: Including pain rating scale, medication(s)/side effects and non-pharmacologic comfort measures Outcome: Progressing   Problem: Clinical Measurements: Goal: Ability to maintain clinical measurements within normal limits will improve Outcome: Progressing Goal: Will remain free from infection Outcome: Progressing Goal: Diagnostic test results will improve Outcome: Progressing Goal: Respiratory complications will improve Outcome: Progressing   Problem: Nutrition: Goal: Adequate nutrition will be maintained Outcome: Progressing   Problem: Coping: Goal: Level of anxiety will decrease Outcome: Progressing   Problem: Elimination: Goal: Will not experience complications related to bowel motility Outcome: Progressing Goal: Will not experience complications related to urinary retention Outcome: Progressing

## 2023-09-08 DIAGNOSIS — G9341 Metabolic encephalopathy: Secondary | ICD-10-CM | POA: Diagnosis not present

## 2023-09-08 LAB — GLUCOSE, CAPILLARY
Glucose-Capillary: 109 mg/dL — ABNORMAL HIGH (ref 70–99)
Glucose-Capillary: 126 mg/dL — ABNORMAL HIGH (ref 70–99)
Glucose-Capillary: 132 mg/dL — ABNORMAL HIGH (ref 70–99)
Glucose-Capillary: 143 mg/dL — ABNORMAL HIGH (ref 70–99)
Glucose-Capillary: 154 mg/dL — ABNORMAL HIGH (ref 70–99)
Glucose-Capillary: 88 mg/dL (ref 70–99)

## 2023-09-08 MED ORDER — MEDIHONEY WOUND/BURN DRESSING EX PSTE: 1.0000 | PASTE | Freq: Every day | CUTANEOUS | Status: DC

## 2023-09-08 MED ORDER — OXYCODONE HCL 5 MG PO TABS: 5.0000 mg | ORAL_TABLET | Freq: Four times a day (QID) | ORAL | 0 refills | Status: DC | PRN

## 2023-09-08 MED ORDER — OXYCODONE HCL ER 15 MG PO T12A: 15.0000 mg | EXTENDED_RELEASE_TABLET | Freq: Two times a day (BID) | ORAL | 0 refills | Status: AC

## 2023-09-08 MED ORDER — FUROSEMIDE 20 MG PO TABS
20.0000 mg | ORAL_TABLET | Freq: Every day | ORAL | Status: DC
Start: 2023-09-09 — End: 2023-09-28

## 2023-09-08 MED ORDER — FUROSEMIDE 10 MG/ML IJ SOLN
20.0000 mg | Freq: Once | INTRAMUSCULAR | Status: AC
  Administered 2023-09-08: 20 mg via INTRAVENOUS
  Filled 2023-09-08: qty 2

## 2023-09-08 MED ORDER — AMLODIPINE BESYLATE 5 MG PO TABS
5.0000 mg | ORAL_TABLET | Freq: Every day | ORAL | Status: DC
Start: 2023-09-09 — End: 2023-09-08

## 2023-09-08 MED ORDER — ACETAMINOPHEN 325 MG PO TABS: 650.0000 mg | ORAL_TABLET | Freq: Four times a day (QID) | ORAL | Status: DC | PRN

## 2023-09-08 MED ORDER — FUROSEMIDE 10 MG/ML IJ SOLN: 40.0000 mg | Freq: Once | INTRAMUSCULAR | Status: DC

## 2023-09-08 NOTE — Plan of Care (Signed)
   Problem: Coping: Goal: Ability to adjust to condition or change in health will improve Outcome: Progressing   Problem: Fluid Volume: Goal: Ability to maintain a balanced intake and output will improve Outcome: Progressing   Problem: Health Behavior/Discharge Planning: Goal: Ability to identify and utilize available resources and services will improve Outcome: Progressing

## 2023-09-08 NOTE — TOC Transition Note (Signed)
 Transition of Care [facility]) - Discharge Note   Patient Details  Name: Gregory [name] MRN: [id number] Date of Birth: [birth year]  Transition of Care [employer]) CM/SW Contact:  Ralene Bathe, LCSW Phone Number: 09/08/2023, 3:36 PM   Clinical Narrative:    Patient will DC to: [name] Anticipated DC date:  3/23/225 Family notified: LM for daughter, Agricultural consultant by:  Alyssa Grove   Per MD patient ready for DC to SNF. RN to call report prior to discharge [phone] room 147). RN, patient, patient's family, and facility notified of DC. Discharge Summary  sent to facility. DC packet on chart. Ambulance transport will be requested for patient.   CSW will sign off for now as social work intervention is no longer needed. Please consult [place] again if new needs arise.    Final next level of care: Skilled Nursing Facility Barriers to Discharge: Barriers Resolved   Patient Goals and CMS Choice            Discharge Placement              Patient chooses bed at:  [facility]) Patient to be transferred to facility by: [facility] Name of family member notified: Gregory [name] (Daughter)  [phone] Patient and family notified of of transfer: 09/08/23  Discharge Plan and Services Additional resources added to the After Visit Summary for   In-house Referral: Clinical Social Work   Post Acute Care Choice: Skilled Nursing Facility                               Social Drivers of Health (SDOH) Interventions SDOH Screenings   Food Insecurity: No Food Insecurity (09/03/2023)  Housing: Low Risk  (09/03/2023)  Transportation Needs: No Transportation Needs (09/03/2023)  Utilities: Not At Risk (09/03/2023)  Depression (PHQ2-9): Low Risk  (03/08/2021)  Tobacco Use: High Risk (09/02/2023)     Readmission Risk Interventions    09/04/2023    1:49 PM 09/06/2022    1:48 PM 08/27/2022   12:46 PM  Readmission Risk Prevention Plan  Transportation Screening Complete Complete Complete   Medication Review Oceanographer) Complete Complete Complete  PCP or Specialist appointment within 3-5 days of discharge Complete Complete Complete  HRI or Home Care Consult Complete Complete Complete  SW Recovery Care/Counseling Consult Complete Complete Complete  Palliative Care Screening Not Applicable Not Applicable Not Applicable  Skilled Nursing Facility Complete Complete Complete

## 2023-09-08 NOTE — Discharge Summary (Signed)
 Physician Discharge Summary  Gregory [name] AVW:[id number] DOB: [birth year] DOA: 09/02/2023  PCP: [name], Provider, MD  Admit date: 09/02/2023 Discharge date: 09/08/2023  Admitted From:( SNF, long-term resident at [facility] for last 7 years) Disposition:  (same)  Recommendations for Outpatient Follow-up:  Please obtain BMP/CBC in one week   Diet recommendation: Carb Modified   Brief/Interim Summary: Gregory [name] is a 64 y.o. 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  Patient was hospitalized January and early March 2025 with somnolence felt to be multifactorial secondary to polypharmacy and hyponatremia.  Adjustments were made in his psychotropic and narcotic medications with additional recommendations for patient to follow-up with psychiatry to adjust psychotropic medications if somnolence or altered mentation persisted.  Reportedly patient was found slumped over the wheelchair with altered mentation/somnolence 30 minutes prior to arrival.  Apparently right before this the patient was alert and talking.  According to staff patient has a history of using nonprescribed medications from his family.  It is also noted per the nursing facility staff that his ostomy was reported to be protruding more than normal.  Upon arrival his room air sat was 88% and he was placed on 5 L O2 via mask with O2 sats 98%.  Glucose was 174.  Vital signs were otherwise stable.   While in the ER stat CT of the head was obtained that did not demonstrate any new or acute intracranial abnormalities.  Venous ABG was unremarkable.  Sodium was 123, potassium was 5.7, CO2 was 21 with a normal anion gap, creatinine was 1.32.   Urinalysis was unremarkable.  Alcohol level was <10 and a urine drug screen was positive for THC.  Patient remained mostly somnolent.  Eventually admitted to hospital service. -Please see discussion below regarding hospital course    Acute metabolic encephalopathy with history of recurrent delirium and encephalopathy, POA:  -Likely multifactorial.  Polypharmacy vs hyponatremia vs dehydration.  As well urine drug screen positive for THC CT head unremarkable, ammonia normal. -Patient has improved after holding all narcotics, psychotic and neuroleptic medications, but he when he started to wake up, he became more restless, and agitated, where he was resumed back on all of his medications gradually he is currently back on his facility regimen on Zyprexa, Trileptal and Depakote. -He is on MS Contin 15 units every 8 hours, this has been changed to every 12 hours and to the third dose of long-acting MS Contin daily this was replaced with 5 mg of p.o. oxycodone as needed for pain every 6 hours.  Recurrent hypovolemic hyponatremia:  -Has history of hyponatremia in the past.  Appears to be hypovolemic clinically admission, replaced with IV fluids, sodium 123 on presentation, this was replaced it is 128 at time of discharge.  But overall he is with low baseline sodium averaging in the high 120s to 130s, this could be especially in the setting of his recently adjusted antipsychotic medications upon recent discharge. -Patient does appear to be euvolemic at time of discharge, he has lower extremity lymphedema, so will be started on low-dose Lasix.  Prolapsed ileostomy stoma:  -Seen by general surgery and stoma reduced at bedside in the ED. does appear to be mildly prolapsed today again as well, which is producing  stool, have no complaints, discussed with general surgery, no pain, and producing pain, no evidence of swelling or erythema then thing to do.   Hyperkalemia:  -Resolved with Lokelma   CKD stage IIIa:   -Baseline creatinine appears to be between 1.4-1.6.  Currently better than baseline.  Monitor.   Anemia of chronic disease:  -Baseline hemoglobin is around 9, has dropped during hospital stay to 7.3, received 1 unit PRBC yesterday, good response with hemoglobin of 9 this morning, - no evidence of GI bleed, stool with normal color and ileostomy.   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  Appears to be chronic.  Wound care following.  Continue with wound care recommendations.   Chronic pain right AKA stump:  -Please see above discussion regarding pain medications    Type 2 diabetes mellitus:  -Recent hemoglobin A1c was 6.4 in January.  Resume home regimen  History of right PCA embolic CVA January 2025: Will continue aspirin and statin.   COPD: Stable.  Continue DuoNeb.   Depression: Continue Lexapro.   Hypertension -Blood pressure persistently elevated on as needed hydralazine, started on 5 mg of amlodipine   BPH with history of recurrent urinary retention: Doing well currently.  Continue Flomax.     Discharge Diagnoses:  Principal Problem:   Acute metabolic encephalopathy Active Problems:   COPD (chronic obstructive pulmonary disease) (HCC)   DM2 (diabetes mellitus, type 2) (HCC)   Amputated toe of left foot (HCC)   Above knee amputation of right lower extremity (HCC)   Chronic kidney disease, stage 3b (HCC)   Hyponatremia   Agitation    Discharge Instructions  Discharge Instructions     Diet - low sodium heart healthy   Complete by: As directed    Discharge instructions   Complete by: As directed    Follow with SNF physician  Get CBC, CMP, checked  by Primary MD next visit.    Disposition Place/long-term resident   Diet: Carb modified diet   On your next visit with your primary care physician please Get Medicines reviewed and adjusted.   Please request your Prim.MD to go over  all Hospital Tests and Procedure/Radiological results at the follow up, please get all Hospital records sent to your Prim MD by signing hospital release before you go home.   If you experience worsening of your admission symptoms, develop shortness of breath, life threatening emergency, suicidal or homicidal thoughts you must seek medical attention immediately by calling 911 or calling your MD immediately  if symptoms less severe.  You Must read complete instructions/literature along with all the possible adverse reactions/side effects for all the Medicines you take and that have been prescribed to you. Take any new Medicines after you have completely understood and accpet all the possible adverse reactions/side effects.   Do not drive, operating heavy machinery, perform activities at heights, swimming or participation in water activities or provide baby sitting services if your were admitted for syncope or siezures until you have seen by Primary MD or a Neurologist and advised to do so again.  Do not drive when taking Pain medications.    Do not take more than prescribed Pain, Sleep and Anxiety Medications  Special Instructions: If you have smoked or chewed Tobacco  in the last 2 yrs please stop smoking, stop any regular Alcohol  and or any Recreational drug use.  Wear Seat belts while driving.   Please note  You were  cared for by a hospitalist during your hospital stay. If you have any questions about your discharge medications or the care you received while you were in the hospital after you are discharged, you can call the unit and asked to speak with the hospitalist on call if the hospitalist that took care of you is not available. Once you are discharged, your primary care physician will handle any further medical issues. Please note that NO REFILLS for any discharge medications will be authorized once you are discharged, as it is imperative that you return to your primary care physician  (or establish a relationship with a primary care physician if you do not have one) for your aftercare needs so that they can reassess your need for medications and monitor your lab values.   Discharge wound care:   Complete by: As directed    Cleanse L leg wound, L 3rd and 4th digit wounds with Vashe wound cleanser Hart Rochester [phone]) apply Medihoney to wound beds daily, cover with dry gauze and silicone foam or dry gauze and tape whichever is preferred. Can secure entire dressing with Kerlix roll gauze if desired wrapping from toes to just below knee.   Increase activity slowly   Complete by: As directed       Allergies as of 09/08/2023       Reactions   Latex Other (See Comments)   Unknown reaction        Medication List     TAKE these medications    acetaminophen 325 MG tablet Commonly known as: TYLENOL Take 2 tablets (650 mg total) by mouth every 6 (six) hours as needed for mild pain (pain score 1-3) (or Fever >/= 101). What changed:  medication strength how much to take when to take this reasons to take this   aspirin EC 81 MG tablet Take 81 mg by mouth daily.   atorvastatin 80 MG tablet Commonly known as: LIPITOR Take 1 tablet (80 mg total) by mouth daily.   Cholecalciferol 25 MCG (1000 UT) capsule Take 2,000 Units by mouth daily.   Cranberry 450 MG Tabs Take 450 mg by mouth daily.   divalproex 125 MG DR tablet Commonly known as: DEPAKOTE Take 125 mg by mouth 3 (three) times daily. For behaviors and agitation   docusate sodium 100 MG capsule Commonly known as: COLACE Take 100 mg by mouth every 12 (twelve) hours as needed (constipation).   escitalopram 20 MG tablet Commonly known as: LEXAPRO Take 1 tablet (20 mg total) by mouth daily.   famotidine 20 MG tablet Commonly known as: PEPCID Take 1 tablet (20 mg total) by mouth daily.   ferrous sulfate 325 (65 FE) MG tablet Take 1 tablet (325 mg total) by mouth 3 (three) times daily with meals. What changed:  when to take this   furosemide 20 MG tablet Commonly known as: Lasix Take 1 tablet (20 mg total) by mouth daily. Start taking on: September 09, 2023   insulin lispro 100 UNIT/ML KwikPen Commonly known as: HUMALOG Inject 0-10 Units into the skin See admin instructions. Inject 0-10 units into the skin three times a day and at bedtime, PER SLIDING SCALE:  BGL 151-200 = 2 units 201-250 = 4 units 251-300 = 6 units 301-350 =  8 units 351-400 = 10 units What changed: additional instructions   ipratropium-albuterol 0.5-2.5 (3) MG/3ML Soln Commonly known as: DUONEB Take 3 mLs by nebulization every 6 (six) hours as needed (shortness of breath, wheezing).   lactulose 10 GM/15ML  solution Commonly known as: CHRONULAC Take 30 mLs (20 g total) by mouth 2 (two) times daily.   Lantus SoloStar 100 UNIT/ML Solostar Pen Generic drug: insulin glargine Inject 5 Units into the skin daily. What changed:  how much to take when to take this   leptospermum manuka honey Pste paste Apply 1 Application topically daily. Start taking on: September 09, 2023   lidocaine 5 % Commonly known as: LIDODERM Place 1 patch onto the skin daily. Remove & Discard patch within 12 hours or as directed by MD Apply to right AKA What changed: Another medication with the same name was removed. Continue taking this medication, and follow the directions you see here.   magnesium oxide 400 (240 Mg) MG tablet Commonly known as: MAG-OX Take 400 mg by mouth in the morning, at noon, and at bedtime.   multivitamin with minerals Tabs tablet Take 1 tablet by mouth daily.   Narcan 4 MG/0.1ML Liqd nasal spray kit Generic drug: naloxone Place 1 spray into the nose See admin instructions. 1 spray alternating nostrils every 5 minutes as needed for opiate overdose.   Natural Balance Tears 0.1-0.3 % Soln Generic drug: Dextran 70-Hypromellose Place 1 drop into both eyes every 4 (four) hours as needed (dry eyes).   OLANZapine 15 MG  tablet Commonly known as: ZYPREXA Take 1 tablet (15 mg total) by mouth 2 (two) times daily.   omeprazole 20 MG capsule Commonly known as: PRILOSEC Take 20 mg by mouth at bedtime.   OXcarbazepine 150 MG tablet Commonly known as: TRILEPTAL Take 1 tablet (150 mg total) by mouth 2 (two) times daily.   oxyCODONE 15 mg 12 hr tablet Commonly known as: OXYCONTIN Take 1 tablet (15 mg total) by mouth every 12 (twelve) hours for 5 days. What changed: when to take this   oxyCODONE 5 MG immediate release tablet Commonly known as: Oxy IR/ROXICODONE Take 1 tablet (5 mg total) by mouth every 6 (six) hours as needed for severe pain (pain score 7-10). What changed: You were already taking a medication with the same name, and this prescription was added. Make sure you understand how and when to take each.   pregabalin 150 MG capsule Commonly known as: LYRICA Take 1 capsule (150 mg total) by mouth 2 (two) times daily.   senna 8.6 MG Tabs tablet Commonly known as: SENOKOT Take 2 tablets by mouth at bedtime.   sodium bicarbonate 650 MG tablet Take 650 mg by mouth 2 (two) times daily.   tamsulosin 0.4 MG Caps capsule Commonly known as: FLOMAX Take 1 capsule (0.4 mg total) by mouth daily.   UNABLE TO FIND Apply 1 application  topically daily. Med Name: Marlene Bast  Apply to buttocks for 14 days (08/21/23-09/04/23)   Uro-MP 118 MG Caps Take 118 mg by mouth 2 (two) times daily.               Discharge Care Instructions  (From admission, onward)           Start     Ordered   09/08/23 0000  Discharge wound care:       Comments: Cleanse L leg wound, L 3rd and 4th digit wounds with Vashe wound cleanser Hart [place] [phone]) apply Medihoney to wound beds daily, cover with dry gauze and silicone foam or dry gauze and tape whichever is preferred. Can secure entire dressing with Kerlix roll gauze if desired wrapping from toes to just below knee.   09/08/23 1414  Allergies   Allergen Reactions   Latex Other (See Comments)    Unknown reaction    Consultations: None  Procedures/Studies: CT HEAD WO CONTRAST Result Date: 09/02/2023 CLINICAL DATA:  Mental status change EXAM: CT HEAD WITHOUT CONTRAST TECHNIQUE: Contiguous axial images were obtained from the base of the skull through the vertex without intravenous contrast. RADIATION DOSE REDUCTION: This exam was performed according to the departmental dose-optimization program which includes automated exposure control, adjustment of the mA and/or kV according to patient size and/or use of iterative reconstruction technique. COMPARISON:  MRI head 06/27/2023, CT head 07/15/2023 FINDINGS: Brain: No acute intracranial hemorrhage. No CT evidence of acute infarct. Nonspecific hypoattenuation in the periventricular and subcortical white matter favored to reflect chronic microvascular ischemic changes. Redemonstrated remote infarct in the right PCA territory no edema, mass effect, or midline shift. The basilar cisterns are patent. Ventricles: Prominence of the ventricles suggesting underlying parenchymal volume loss. Vascular: Atherosclerotic calcifications of the carotid siphons and intracranial vertebral arteries. No hyperdense vessel. Skull: No acute or aggressive finding. Chronic appearing bilateral nasal bone deformities. Orbits: Orbits are symmetric. Sinuses: Mucosal thickening in the ethmoid sinuses and left maxillary sinus. Other: Small left mastoid effusion. Similar focal soft tissue swelling and surgical clips in the right facial soft tissues. IMPRESSION: No CT evidence of acute intracranial abnormality. Remote right PCA territory infarct. Mild chronic microvascular ischemic changes. Electronically Signed   By: Emily Filbert M.D.   On: 09/02/2023 13:55   DG CHEST PORT 1 VIEW Result Date: 08/14/2023 CLINICAL DATA:  Altered mental status EXAM: PORTABLE CHEST 1 VIEW COMPARISON:  07/15/2023 FINDINGS: Cardiac shadow is within  normal limits. Aortic calcifications are seen. The lungs are well aerated bilaterally. Persistent interstitial changes are noted without focal confluent infiltrate. No effusion is seen. Postsurgical changes in the cervical spine are noted. IMPRESSION: Chronic interstitial markings without acute infiltrate. Electronically Signed   By: Alcide Clever M.D.   On: 08/14/2023 23:46      Subjective:  Patient with good appetite as discussed with staff, denies any abdominal pain, no nausea, no vomiting Discharge Exam: Vitals:   09/08/23 0400 09/08/23 0929  BP: (!) 102/53 (!) 117/56  Pulse: 60 60  Resp: 14 14  Temp: 98.2 F (36.8 C) 98.6 F (37 C)  SpO2: 92% 91%   Vitals:   09/07/23 1937 09/07/23 2322 09/08/23 0400 09/08/23 0929  BP: (!) 115/94 130/81 (!) 102/53 (!) 117/56  Pulse: 60 73 60 60  Resp:   14 14  Temp: 98 F (36.7 C) 98 F (36.7 C) 98.2 F (36.8 C) 98.6 F (37 C)  TempSrc: Oral Oral Oral Oral  SpO2: 90%  92% 91%  Weight:      Height:         Awake Alert, pleasant, no apparent distress today. Symmetrical Chest wall movement, Good air movement bilaterally, CTAB +ve B.Sounds, Abd Soft, ostomy present with brown color stool, with mildly prolapsed stoma, but no swelling or pain Right BKA, left foot with multiple toe amputation with some ulceration      The results of significant diagnostics from this hospitalization (including imaging, microbiology, ancillary and laboratory) are listed below for reference.     Microbiology: No results found for this or any previous visit (from the past 240 hours).   Labs: BNP (last 3 results) Recent Labs    09/13/22 0830 12/03/22 1920 06/27/23 0558  BNP 55.6 45.7 63.1   Basic Metabolic Panel: Recent Labs  Lab 09/02/23 1630 09/02/23 2320  09/03/23 0638 09/05/23 0806 09/06/23 0935  NA 124* 122* 124* 126* 128*  K 5.8* 5.0 4.8 4.5 4.3  CL 96* 93* 95* 97* 96*  CO2 23 21* 21* 19* 23  GLUCOSE 103* 182* 82 149* 123*  BUN 13  17 12 12 15   CREATININE 1.35* 1.25* 1.17 1.11 1.25*  CALCIUM 8.2* 8.2* 8.1* 8.0* 8.6*   Liver Function Tests: Recent Labs  Lab 09/02/23 1103 09/03/23 0638  AST 15 11*  ALT 10 8  ALKPHOS 81 74  BILITOT 0.7 0.4  PROT 5.9* 5.5*  ALBUMIN 2.5* 2.3*   No results for input(s): "LIPASE", "AMYLASE" in the last 168 hours. Recent Labs  Lab 09/02/23 1103  AMMONIA 33   CBC: Recent Labs  Lab 09/02/23 1103 09/02/23 1113 09/03/23 0638 09/05/23 0806 09/06/23 0935  WBC 7.8  --  8.2 5.5 5.9  NEUTROABS 5.4  --   --   --   --   HGB 8.2* 8.5* 7.6* 7.3* 9.0*  HCT 24.7* 25.0* 23.2* 22.2* 27.1*  MCV 91.8  --  90.3 90.2 89.7  PLT 258  --  249 251 260   Cardiac Enzymes: No results for input(s): "CKTOTAL", "CKMB", "CKMBINDEX", "TROPONINI" in the last 168 hours. BNP: Invalid input(s): "POCBNP" CBG: Recent Labs  Lab 09/08/23 0017 09/08/23 0358 09/08/23 0910 09/08/23 0926 09/08/23 1125  GLUCAP 154* 132* 126* 143* 109*   D-Dimer No results for input(s): "DDIMER" in the last 72 hours. Hgb A1c No results for input(s): "HGBA1C" in the last 72 hours. Lipid Profile No results for input(s): "CHOL", "HDL", "LDLCALC", "TRIG", "CHOLHDL", "LDLDIRECT" in the last 72 hours. Thyroid function studies No results for input(s): "TSH", "T4TOTAL", "T3FREE", "THYROIDAB" in the last 72 hours.  Invalid input(s): "FREET3" Anemia work up No results for input(s): "VITAMINB12", "FOLATE", "FERRITIN", "TIBC", "IRON", "RETICCTPCT" in the last 72 hours. Urinalysis    Component Value Date/Time   COLORURINE GREEN (A) 09/02/2023 1103   APPEARANCEUR CLEAR 09/02/2023 1103   LABSPEC 1.012 09/02/2023 1103   PHURINE 5.0 09/02/2023 1103   GLUCOSEU NEGATIVE 09/02/2023 1103   HGBUR NEGATIVE 09/02/2023 1103   BILIRUBINUR NEGATIVE 09/02/2023 1103   KETONESUR NEGATIVE 09/02/2023 1103   PROTEINUR 30 (A) 09/02/2023 1103   UROBILINOGEN 1.0 02/03/2014 0052   NITRITE NEGATIVE 09/02/2023 1103   LEUKOCYTESUR NEGATIVE  09/02/2023 1103   Sepsis Labs Recent Labs  Lab 09/02/23 1103 09/03/23 0638 09/05/23 0806 09/06/23 0935  WBC 7.8 8.2 5.5 5.9   Microbiology No results found for this or any previous visit (from the past 240 hours).   Time coordinating discharge: Over 30 minutes  SIGNED:   Huey Bienenstock, MD  [facility] 09/08/2023, 2:24 PM Pager   If 7PM-7AM, please contact night-coverage [email or web address] Password [email or web address]

## 2023-09-08 NOTE — Progress Notes (Signed)
 [facility] night cross cover note:  I was notified by RN that this patient is agitated, uncooperative with staff attempting to provide care for him, and attempting to get out of bed, with these behaviors refractory to attempts at verbal redirection as well as to receipt of his scheduled po zyprexa and to a one-time dose of seroquel that was requested specifically by the patient.  In the setting of associated interference with ongoing medical treatment posing potential harm to themself, I have ordered Haldol 5 mg iv x 1 dose now as well as Ativan 1 mg IV x 1 dose prn for agitation.   Update: following the above doses of haldol followed by ativan, the patient is now more calm and cooperative.     Newton Pigg, DO Hospitalist

## 2023-09-08 NOTE — Progress Notes (Incomplete)
 PROGRESS NOTE    Gregory [name]  ZDG:[id number] DOB: May 27, 1961 DOA: 09/02/2023 PCP: [name], Provider, MD   Brief Narrative:    Gregory [name] is a 63 y.o. 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  Patient was hospitalized January and early March 2025 with somnolence felt to be multifactorial secondary to polypharmacy and hyponatremia.  Adjustments were made in his psychotropic and narcotic medications with additional recommendations for patient to follow-up with psychiatry to adjust psychotropic medications if somnolence or altered mentation persisted.  Reportedly patient was found slumped over the wheelchair with altered mentation/somnolence 30 minutes prior to arrival.  Apparently right before this the patient was alert and talking.  According to staff patient has a history of using nonprescribed medications from his family.  It is also noted per the nursing facility staff that his ostomy was reported to be protruding more than normal.  Upon arrival his room air sat was 88% and he was placed on 5 L O2 via mask with O2 sats 98%.  Glucose was 174.  Vital signs were otherwise stable.   While in the ER stat CT of the head was obtained that did not demonstrate any new or acute intracranial abnormalities.  Venous ABG was unremarkable.  Sodium was 123, potassium was 5.7, CO2 was 21 with a normal anion gap, creatinine was 1.32.  Urinalysis was unremarkable.  Alcohol level was <10 and a urine drug screen was positive for THC.  Patient remained mostly somnolent.  Eventually admitted to hospital service. -Please see discussion below regarding hospital course  Assessment & Plan:   Principal Problem:    Acute metabolic encephalopathy  Acute metabolic encephalopathy with history of recurrent delirium and encephalopathy, POA:  -Likely multifactorial.  Polypharmacy vs hyponatremia vs dehydration.  As well urine drug screen positive for THC CT head unremarkable, ammonia normal. -Patient has improved after holding all narcotics, psychotropics and neuroleptic sedative medication, but given he is back to his baseline of increased restlessness and agitation he had to be resumed him back on these meds gradually.   Recurrent hypovolemic hyponatremia:  -Has history of hyponatremia in the past.  Appears to be hypovolemic clinically.  Presented with 123 which has been stable.  -Improving, 126 today.  Prolapsed ileostomy stoma:  -Seen by general surgery and stoma reduced at bedside in the ED. does appear to be mildly prolapsed today again as well, which is producing stool, have no complaints, discussed with general surgery, no pain, and producing pain, no evidence of swelling or erythema then thing to do.  Hyperkalemia:  -Resolved with Lokelma  CKD stage IIIa:  -Baseline creatinine appears to be between 1.4-1.6.  Currently better than baseline.  Monitor.  Anemia of chronic disease:  -Baseline hemoglobin is around 9, has dropped during hospital stay to 7.3, received 1 unit PRBC yesterday, good response with hemoglobin of 9 this morning, - no evidence of GI bleed, stool with normal color and ileostomy.  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  Appears to be chronic.  Wound care following.  Chronic pain right AKA stump:  -Apparently patient was on OxyContin 15 mg every 12 hours as well as Lyrica and he is also on  multiple psychotropic medications.  Location has been resumed gradually  Type 2 diabetes mellitus:  -Recent hemoglobin A1c was 6.4 in January.  Patient was discharged on 5 units of Lantus however this was bumped up to  15 units at nursing home.  Keep on insulin sliding scale.  History of right PCA embolic CVA January 2025: Will continue aspirin and statin.  COPD: Stable.  Continue DuoNeb.  Depression: Continue Lexapro.  Hypertension -Blood pressure persistently elevated on as needed hydralazine, started on 5 mg of amlodipine  BPH with history of recurrent urinary retention: Doing well currently.  Continue Flomax.  DVT prophylaxis:    Code Status: Full Code  Family Communication: discussed with daughter by phone 3/21  Status is: Inpatient Patient is medically stable for discharge, awaiting bed availability at [facility]   Estimated body mass index is 27.15 kg/m as calculated from the following:   Height as of this encounter: 6' (1.829 m).   Weight as of this encounter: 90.8 kg.  Pressure Injury 07/16/23 Sacrum Medial Stage 1 -  Intact skin with non-blanchable redness of a localized area usually over a bony prominence. non-blanchable redness to sacrum (Active)  07/16/23 0645  Location: Sacrum  Location Orientation: Medial  Staging: Stage 1 -  Intact skin with non-blanchable redness of a localized area usually over a bony prominence.  Wound Description (Comments): non-blanchable redness to sacrum  Present on Admission: Yes  Dressing Type Foam - Lift dressing to assess site every shift 09/08/23 0929   Nutritional Assessment: Body mass index is 27.15 kg/m.Marland Kitchen Seen by dietician.  I agree with the assessment and plan as outlined below: Nutrition Status:        . Skin Assessment: I have examined the patient's skin and I agree with the wound assessment as performed by the wound care RN as outlined below: Pressure Injury 07/16/23 Sacrum Medial Stage 1 -  Intact skin with non-blanchable redness of a localized area usually over a bony prominence. non-blanchable redness to sacrum (Active)  07/16/23 0645  Location: Sacrum  Location Orientation: Medial  Staging: Stage 1 -  Intact skin with  non-blanchable redness of a localized area usually over a bony prominence.  Wound Description (Comments): non-blanchable redness to sacrum  Present on Admission: Yes  Dressing Type Foam - Lift dressing to assess site every shift 09/08/23 0929    Consultants:  General surgery  Procedures:  None  Antimicrobials:  Anti-infectives (From admission, onward)    None         Subjective: He denies any complaints, appetite is good, finishing all his meals.  Objective: Vitals:   09/07/23 1937 09/07/23 2322 09/08/23 0400 09/08/23 0929  BP: (!) 115/94 130/81 (!) 102/53 (!) 117/56  Pulse: 60 73 60 60  Resp:   14 14  Temp: 98 F (36.7 C) 98 F (36.7 C) 98.2 F (36.8 C) 98.6 F (37 C)  TempSrc: Oral Oral Oral Oral  SpO2: 90%  92% 91%  Weight:      Height:        Intake/Output Summary (Last 24 hours) at 09/08/2023 1356 Last data filed at 09/08/2023 0929 Gross per 24 hour  Intake 1888 ml  Output 1400 ml  Net 488 ml   Filed Weights   09/06/23 0000  Weight: 90.8 kg    Examination:    This morning, but wakes up, answer questions and go back to sleep Clear to auscultation bilaterally +ve B.Sounds, Abd Soft, ostomy present with brown color stool, with mildly  prolapsed stoma, but no swelling or pain Right BKA, left foot with multiple toe amputation with some ulceration   Data Reviewed: I have personally reviewed following labs and imaging studies  CBC: Recent Labs  Lab 09/02/23 1103 09/02/23 1113 09/03/23 0638 09/05/23 0806 09/06/23 0935  WBC 7.8  --  8.2 5.5 5.9  NEUTROABS 5.4  --   --   --   --   HGB 8.2* 8.5* 7.6* 7.3* 9.0*  HCT 24.7* 25.0* 23.2* 22.2* 27.1*  MCV 91.8  --  90.3 90.2 89.7  PLT 258  --  249 251 260   Basic Metabolic Panel: Recent Labs  Lab 09/02/23 1630 09/02/23 2320 09/03/23 0638 09/05/23 0806 09/06/23 0935  NA 124* 122* 124* 126* 128*  K 5.8* 5.0 4.8 4.5 4.3  CL 96* 93* 95* 97* 96*  CO2 23 21* 21* 19* 23  GLUCOSE 103* 182* 82 149*  123*  BUN 13 17 12 12 15   CREATININE 1.35* 1.25* 1.17 1.11 1.25*  CALCIUM 8.2* 8.2* 8.1* 8.0* 8.6*   GFR: Estimated Creatinine Clearance: 65.5 mL/min (A) (by C-G formula based on SCr of 1.25 mg/dL (H)). Liver Function Tests: Recent Labs  Lab 09/02/23 1103 09/03/23 0638  AST 15 11*  ALT 10 8  ALKPHOS 81 74  BILITOT 0.7 0.4  PROT 5.9* 5.5*  ALBUMIN 2.5* 2.3*   No results for input(s): "LIPASE", "AMYLASE" in the last 168 hours. Recent Labs  Lab 09/02/23 1103  AMMONIA 33   Coagulation Profile: No results for input(s): "INR", "PROTIME" in the last 168 hours. Cardiac Enzymes: No results for input(s): "CKTOTAL", "CKMB", "CKMBINDEX", "TROPONINI" in the last 168 hours. BNP (last 3 results) No results for input(s): "PROBNP" in the last 8760 hours. HbA1C: No results for input(s): "HGBA1C" in the last 72 hours. CBG: Recent Labs  Lab 09/08/23 0017 09/08/23 0358 09/08/23 0910 09/08/23 0926 09/08/23 1125  GLUCAP 154* 132* 126* 143* 109*   Lipid Profile: No results for input(s): "CHOL", "HDL", "LDLCALC", "TRIG", "CHOLHDL", "LDLDIRECT" in the last 72 hours. Thyroid Function Tests: No results for input(s): "TSH", "T4TOTAL", "FREET4", "T3FREE", "THYROIDAB" in the last 72 hours. Anemia Panel: No results for input(s): "VITAMINB12", "FOLATE", "FERRITIN", "TIBC", "IRON", "RETICCTPCT" in the last 72 hours. Sepsis Labs: Recent Labs  Lab 09/02/23 1113  LATICACIDVEN 0.4*    No results found for this or any previous visit (from the past 240 hours).   Radiology Studies: No results found.   Scheduled Meds:  (feeding supplement) PROSource Plus  30 mL Oral BID BM   sodium chloride   Intravenous Once   amLODipine  5 mg Oral Daily   aspirin EC  81 mg Oral Daily   atorvastatin  80 mg Oral Daily   cholecalciferol  2,000 Units Oral Daily   divalproex  125 mg Oral TID   escitalopram  20 mg Oral Daily   feeding supplement  237 mL Oral TID BM   ferrous sulfate  325 mg Oral TID WC    fluticasone  2 spray Each Nare Daily   insulin aspart  0-15 Units Subcutaneous Q4H   lactulose  20 g Oral BID   leptospermum manuka honey  1 Application Topical Daily   OLANZapine  15 mg Oral BID   OXcarbazepine  150 mg Oral BID   oxyCODONE  15 mg Oral Q12H   oxymetazoline  1 spray Each Nare BID   pregabalin  150 mg Oral BID   sodium chloride flush  3 mL Intravenous Q12H  tamsulosin  0.4 mg Oral Daily   Continuous Infusions:     LOS: 5 days   Huey Bienenstock, MD [facility]  09/08/2023, 1:56 PM   *Please note that this is a verbal dictation therefore any spelling or grammatical errors are due to the "Dragon Medical One" system interpretation.  Please page via Amion and do not message via secure chat for urgent patient care matters. Secure chat can be used for non urgent patient care matters.  How to contact the [facility] Attending or Consulting provider 7A - 7P or covering provider during after hours 7P -7A, for this patient?  Check the care team in [facility] and look for a) attending/consulting TRH provider listed and b) the [facility] team listed. Page or secure chat 7A-7P. Log into [email or web address] and use [place]'s universal password to access. If you do not have the password, please contact the hospital operator. Locate the [facility] provider you are looking for under [facility] and page to a number that you can be directly reached. If you still have difficulty reaching the provider, please page the [facility] (Director on Call) for the Hospitalists listed on amion for assistance.

## 2023-09-08 NOTE — Discharge Instructions (Signed)
 Follow with SNF physician  Get CBC, CMP, checked  by Primary MD next visit.    Disposition Place/long-term resident   Diet: Carb modified diet   On your next visit with your primary care physician please Get Medicines reviewed and adjusted.   Please request your Prim.MD to go over all Hospital Tests and Procedure/Radiological results at the follow up, please get all Hospital records sent to your Prim MD by signing hospital release before you go home.   If you experience worsening of your admission symptoms, develop shortness of breath, life threatening emergency, suicidal or homicidal thoughts you must seek medical attention immediately by calling 911 or calling your MD immediately  if symptoms less severe.  You Must read complete instructions/literature along with all the possible adverse reactions/side effects for all the Medicines you take and that have been prescribed to you. Take any new Medicines after you have completely understood and accpet all the possible adverse reactions/side effects.   Do not drive, operating heavy machinery, perform activities at heights, swimming or participation in water activities or provide baby sitting services if your were admitted for syncope or siezures until you have seen by Primary MD or a Neurologist and advised to do so again.  Do not drive when taking Pain medications.    Do not take more than prescribed Pain, Sleep and Anxiety Medications  Special Instructions: If you have smoked or chewed Tobacco  in the last 2 yrs please stop smoking, stop any regular Alcohol  and or any Recreational drug use.  Wear Seat belts while driving.   Please note  You were cared for by a hospitalist during your hospital stay. If you have any questions about your discharge medications or the care you received while you were in the hospital after you are discharged, you can call the unit and asked to speak with the hospitalist on call if the hospitalist that  took care of you is not available. Once you are discharged, your primary care physician will handle any further medical issues. Please note that NO REFILLS for any discharge medications will be authorized once you are discharged, as it is imperative that you return to your primary care physician (or establish a relationship with a primary care physician if you do not have one) for your aftercare needs so that they can reassess your need for medications and monitor your lab values.

## 2023-09-08 NOTE — TOC Progression Note (Signed)
 Transition of Care [facility]) - Initial/Assessment Note    Patient Details  Name: Gregory [name] MRN: [id number] Date of Birth: 1961/04/24  [facility]) CM/SW Contact:    Ralene Bathe, LCSW Phone Number: 09/08/2023, 10:29 AM  Clinical Narrative:                 CSW contacted Janie with [name].  The facility will contact CSW after 12pm to inform if patient can discharge to facility today.  TOC will continue to follow.    Expected Discharge Plan: Skilled Nursing Facility Barriers to Discharge: Continued Medical Work up, SNF Pending bed offer   Patient Goals and CMS Choice            Expected Discharge Plan and Services In-house Referral: Clinical Social Work   Post Acute Care Choice: Skilled Nursing Facility Living arrangements for the past 2 months: Skilled Nursing Facility                                      Prior Living Arrangements/Services Living arrangements for the past 2 months: Skilled Nursing Facility Lives with:: Facility Resident Patient language and need for interpreter reviewed:: Yes Do you feel safe going back to the place where you live?: Yes      Need for Family Participation in Patient Care: Yes (Comment) Care giver support system in place?: Yes (comment)   Criminal Activity/Legal Involvement Pertinent to Current Situation/Hospitalization: No - Comment as needed  Activities of Daily Living   ADL Screening (condition at time of admission) Independently performs ADLs?: No Does the patient have a NEW difficulty with bathing/dressing/toileting/self-feeding that is expected to last >3 days?: No (needs assist) Does the patient have a NEW difficulty with getting in/out of bed, walking, or climbing stairs that is expected to last >3 days?: No (needs asist up to wheelchair) Does the patient have a NEW difficulty with communication that is expected to last >3 days?: No Is the patient deaf or have difficulty hearing?: No Does the  patient have difficulty seeing, even when wearing glasses/contacts?: No Does the patient have difficulty concentrating, remembering, or making decisions?: Yes  Permission Sought/Granted Permission sought to share information with : Facility Engineer, maintenance (IT) granted to share info w AGENCY: SNF        Emotional Assessment Appearance:: Appears stated age Attitude/Demeanor/Rapport: Unable to Assess Affect (typically observed): Unable to Assess Orientation: : Oriented to Self, Oriented to Place, Oriented to Situation, Oriented to  Time Alcohol / Substance Use: Not Applicable Psych Involvement: No (comment)  Admission diagnosis:  Hyponatremia [E87.1] Disorientation [R41.0] Acute metabolic encephalopathy [G93.41] Patient Active Problem List   Diagnosis Date Noted   Acute metabolic encephalopathy 09/02/2023   Agitation 08/03/2023   Persistent hyperactive delirium due to multiple etiologies 08/02/2023   Malnutrition of moderate degree 07/26/2023   Acute encephalopathy 07/16/2023   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) 07/03/2023   Fever 06/28/2023   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   Altered mental status 02/08/2023   Abscess of abdominal cavity (HCC) 02/08/2023   Protein-calorie malnutrition, severe 12/19/2022   Occipital stroke (HCC) 11/27/2022   Skin ulcer of left ankle, limited to breakdown of skin (HCC) 09/19/2022   Stroke-like symptom 09/18/2022   Diabetic ulcer of lower leg (HCC) 09/18/2022   Constipation 09/18/2022   Shortness of breath 09/07/2022  Symptomatic anemia 09/05/2022   Chronic hypoxic respiratory failure (HCC) 09/05/2022   Respiratory failure (HCC) 08/22/2022   Pressure injury of skin 08/22/2022   Influenza A 08/22/2022   Sepsis (HCC) 08/22/2022   Pneumonia 05/01/2022   Right upper lobe pneumonia 09/05/2021   Hyponatremia 09/05/2021   Anxiety 09/05/2021   BPH (benign prostatic  hyperplasia) 09/05/2021   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   Asymptomatic bacteriuria 06/30/2021   Normocytic anemia 06/29/2021   Pain of amputation stump of right lower extremity (HCC) 03/15/2021   Melanoma of cheek (HCC) 10/15/2019   Acute hypoxic respiratory failure (HCC) 07/11/2019   Acute respiratory disease due to COVID-19 virus 07/11/2019   Wound infection 06/22/2019   Abscess 06/22/2019   Chronic kidney disease, stage 3b (HCC) 05/23/2019   Tobacco abuse 05/23/2019   Cellulitis 05/23/2019   S/P AKA (above knee amputation) unilateral, right (HCC) 12/05/2018   Amputation stump infection (HCC)    Lymphedema of left leg    Diabetic polyneuropathy associated with type 2 diabetes mellitus (HCC)    Above knee amputation of right lower extremity (HCC) 10/22/2018   Dehiscence of amputation stump (HCC)    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   Candidiasis 08/12/2018   Necrosis of toe (HCC) 07/08/2018   Amputated toe of left foot (HCC) 06/18/2018   Streptococcal infection    Pressure injury, stage 3 (HCC) 06/06/2018   Cutaneous abscess of left foot    Moderate protein-calorie malnutrition (HCC)    Cellulitis of left lower extremity    Septic arthritis (HCC) 06/03/2018   Idiopathic chronic venous hypertension of left lower extremity with ulcer and inflammation (HCC) 11/14/2017   Great toe amputation status, left 11/14/2017   Enlarged lymph node in neck 07/18/2017   Malignant melanoma of face (HCC) 04/16/2017   Osteomyelitis (HCC) 03/25/2017   Skin cancer 03/25/2017   Depression 11/05/2016   Diabetic ulcer of toe of left foot (HCC) 10/02/2016   Epidural abscess 10/02/2016   Chronic pain 09/27/2016   DM2 (diabetes mellitus, type 2) (HCC) 06/06/2016   GERD without esophagitis 05/11/2016   Hypertensive heart disease with congestive heart failure (HCC) 12/15/2015   Spinal stenosis in cervical region 12/15/2015   Type II diabetes mellitus with neurological  manifestations (HCC) 12/15/2015   Chronic diastolic HF (heart failure) (HCC)    Pulmonary hypertension (HCC)    HTN (hypertension)    HLD (hyperlipidemia)    Urinary retention    Diskitis    Foot drop, bilateral    Sepsis with acute renal failure without septic shock (HCC)    COPD (chronic obstructive pulmonary disease) (HCC) 10/03/2015   Acute osteomyelitis of left foot (HCC) 10/03/2015   MRSA bacteremia 08/13/2015   Chronic low back pain 08/13/2015   Chronic multifocal osteomyelitis, multiple sites (HCC) 08/13/2015   Community acquired pneumonia 08/13/2015   Encephalopathy 03/25/2012   PCP:  [name], Provider, MD Pharmacy:   [facility] [address] [place] [address] Phone: [phone] Fax: [phone]     Social Drivers of Health (SDOH) Social History: SDOH Screenings   Food Insecurity: No Food Insecurity (09/03/2023)  Housing: Low Risk  (09/03/2023)  Transportation Needs: No Transportation Needs (09/03/2023)  Utilities: Not At Risk (09/03/2023)  Depression (PHQ2-9): Low Risk  (03/08/2021)  Tobacco Use: High Risk (09/02/2023)   SDOH Interventions:     Readmission Risk Interventions    09/04/2023    1:49 PM 09/06/2022    1:48 PM 08/27/2022  12:46 PM  Readmission Risk Prevention Plan  Transportation Screening Complete Complete Complete  Medication Review Oceanographer) Complete Complete Complete  PCP or Specialist appointment within 3-5 days of discharge Complete Complete Complete  HRI or Home Care Consult Complete Complete Complete  SW Recovery Care/Counseling Consult Complete Complete Complete  Palliative Care Screening Not Applicable Not Applicable Not Applicable  Skilled Nursing Facility Complete Complete Complete

## 2023-09-08 NOTE — Progress Notes (Signed)
 Patient agitated. He states he takes Seroquel every night, why is he not getting it today. I advised patient its not currently ordered. Increasing verbal and physical aggression towards staff. Contacted on call provider

## 2023-09-25 ENCOUNTER — Emergency Department (HOSPITAL_COMMUNITY)

## 2023-09-25 ENCOUNTER — Encounter (HOSPITAL_COMMUNITY): Payer: Self-pay | Admitting: Emergency Medicine

## 2023-09-25 ENCOUNTER — Other Ambulatory Visit: Payer: Self-pay

## 2023-09-25 ENCOUNTER — Inpatient Hospital Stay (HOSPITAL_COMMUNITY)
Admission: EM | Admit: 2023-09-25 | Discharge: 2023-09-28 | DRG: 602 | Disposition: A | Discharge status: Skilled Nursing Facility | Source: Skilled Nursing Facility | Attending: Family Medicine | Admitting: Family Medicine

## 2023-09-25 DIAGNOSIS — L97529 Non-pressure chronic ulcer of other part of left foot with unspecified severity: Secondary | ICD-10-CM | POA: Diagnosis present

## 2023-09-25 DIAGNOSIS — L03116 Cellulitis of left lower limb: Principal | ICD-10-CM | POA: Diagnosis present

## 2023-09-25 DIAGNOSIS — Z79899 Other long term (current) drug therapy: Secondary | ICD-10-CM

## 2023-09-25 DIAGNOSIS — Z89412 Acquired absence of left great toe: Secondary | ICD-10-CM

## 2023-09-25 DIAGNOSIS — N401 Enlarged prostate with lower urinary tract symptoms: Secondary | ICD-10-CM | POA: Diagnosis present

## 2023-09-25 DIAGNOSIS — J449 Chronic obstructive pulmonary disease, unspecified: Secondary | ICD-10-CM | POA: Diagnosis present

## 2023-09-25 DIAGNOSIS — E1122 Type 2 diabetes mellitus with diabetic chronic kidney disease: Secondary | ICD-10-CM | POA: Diagnosis present

## 2023-09-25 DIAGNOSIS — N400 Enlarged prostate without lower urinary tract symptoms: Secondary | ICD-10-CM | POA: Diagnosis present

## 2023-09-25 DIAGNOSIS — R6510 Systemic inflammatory response syndrome (SIRS) of non-infectious origin without acute organ dysfunction: Secondary | ICD-10-CM | POA: Diagnosis present

## 2023-09-25 DIAGNOSIS — E1149 Type 2 diabetes mellitus with other diabetic neurological complication: Secondary | ICD-10-CM | POA: Diagnosis present

## 2023-09-25 DIAGNOSIS — I5032 Chronic diastolic (congestive) heart failure: Secondary | ICD-10-CM | POA: Diagnosis present

## 2023-09-25 DIAGNOSIS — I890 Lymphedema, not elsewhere classified: Secondary | ICD-10-CM | POA: Diagnosis present

## 2023-09-25 DIAGNOSIS — G4710 Hypersomnia, unspecified: Secondary | ICD-10-CM | POA: Diagnosis present

## 2023-09-25 DIAGNOSIS — N183 Chronic kidney disease, stage 3 unspecified: Secondary | ICD-10-CM | POA: Diagnosis present

## 2023-09-25 DIAGNOSIS — Z89611 Acquired absence of right leg above knee: Secondary | ICD-10-CM

## 2023-09-25 DIAGNOSIS — Z8616 Personal history of COVID-19: Secondary | ICD-10-CM

## 2023-09-25 DIAGNOSIS — F419 Anxiety disorder, unspecified: Secondary | ICD-10-CM | POA: Diagnosis present

## 2023-09-25 DIAGNOSIS — D638 Anemia in other chronic diseases classified elsewhere: Secondary | ICD-10-CM | POA: Diagnosis present

## 2023-09-25 DIAGNOSIS — G9341 Metabolic encephalopathy: Secondary | ICD-10-CM | POA: Diagnosis present

## 2023-09-25 DIAGNOSIS — Z9049 Acquired absence of other specified parts of digestive tract: Secondary | ICD-10-CM

## 2023-09-25 DIAGNOSIS — Z794 Long term (current) use of insulin: Secondary | ICD-10-CM

## 2023-09-25 DIAGNOSIS — F32A Depression, unspecified: Secondary | ICD-10-CM | POA: Diagnosis present

## 2023-09-25 DIAGNOSIS — T50905A Adverse effect of unspecified drugs, medicaments and biological substances, initial encounter: Secondary | ICD-10-CM | POA: Diagnosis present

## 2023-09-25 DIAGNOSIS — R197 Diarrhea, unspecified: Secondary | ICD-10-CM | POA: Diagnosis present

## 2023-09-25 DIAGNOSIS — Z8249 Family history of ischemic heart disease and other diseases of the circulatory system: Secondary | ICD-10-CM

## 2023-09-25 DIAGNOSIS — E785 Hyperlipidemia, unspecified: Secondary | ICD-10-CM | POA: Diagnosis present

## 2023-09-25 DIAGNOSIS — N319 Neuromuscular dysfunction of bladder, unspecified: Secondary | ICD-10-CM | POA: Diagnosis present

## 2023-09-25 DIAGNOSIS — Z85820 Personal history of malignant melanoma of skin: Secondary | ICD-10-CM

## 2023-09-25 DIAGNOSIS — G894 Chronic pain syndrome: Secondary | ICD-10-CM | POA: Diagnosis present

## 2023-09-25 DIAGNOSIS — M79604 Pain in right leg: Secondary | ICD-10-CM | POA: Diagnosis present

## 2023-09-25 DIAGNOSIS — Z833 Family history of diabetes mellitus: Secondary | ICD-10-CM

## 2023-09-25 DIAGNOSIS — T8789 Other complications of amputation stump: Secondary | ICD-10-CM | POA: Diagnosis present

## 2023-09-25 DIAGNOSIS — Y92129 Unspecified place in nursing home as the place of occurrence of the external cause: Secondary | ICD-10-CM

## 2023-09-25 DIAGNOSIS — E11622 Type 2 diabetes mellitus with other skin ulcer: Secondary | ICD-10-CM | POA: Diagnosis present

## 2023-09-25 DIAGNOSIS — Z85828 Personal history of other malignant neoplasm of skin: Secondary | ICD-10-CM

## 2023-09-25 DIAGNOSIS — I10 Essential (primary) hypertension: Secondary | ICD-10-CM | POA: Diagnosis present

## 2023-09-25 DIAGNOSIS — R338 Other retention of urine: Secondary | ICD-10-CM | POA: Diagnosis present

## 2023-09-25 DIAGNOSIS — I130 Hypertensive heart and chronic kidney disease with heart failure and stage 1 through stage 4 chronic kidney disease, or unspecified chronic kidney disease: Secondary | ICD-10-CM | POA: Diagnosis present

## 2023-09-25 DIAGNOSIS — A419 Sepsis, unspecified organism: Principal | ICD-10-CM

## 2023-09-25 DIAGNOSIS — Z8042 Family history of malignant neoplasm of prostate: Secondary | ICD-10-CM

## 2023-09-25 DIAGNOSIS — Z7982 Long term (current) use of aspirin: Secondary | ICD-10-CM

## 2023-09-25 DIAGNOSIS — Z8673 Personal history of transient ischemic attack (TIA), and cerebral infarction without residual deficits: Secondary | ICD-10-CM

## 2023-09-25 DIAGNOSIS — Z89511 Acquired absence of right leg below knee: Secondary | ICD-10-CM

## 2023-09-25 DIAGNOSIS — E1169 Type 2 diabetes mellitus with other specified complication: Secondary | ICD-10-CM | POA: Diagnosis present

## 2023-09-25 DIAGNOSIS — N1831 Chronic kidney disease, stage 3a: Secondary | ICD-10-CM | POA: Diagnosis present

## 2023-09-25 DIAGNOSIS — D631 Anemia in chronic kidney disease: Secondary | ICD-10-CM | POA: Diagnosis present

## 2023-09-25 DIAGNOSIS — F39 Unspecified mood [affective] disorder: Secondary | ICD-10-CM | POA: Diagnosis present

## 2023-09-25 LAB — CBC WITH DIFFERENTIAL/PLATELET
Abs Immature Granulocytes: 0.07 10*3/uL (ref 0.00–0.07)
Basophils Absolute: 0.1 10*3/uL (ref 0.0–0.1)
Basophils Relative: 0 %
Eosinophils Absolute: 0.1 10*3/uL (ref 0.0–0.5)
Eosinophils Relative: 1 %
HCT: 35.8 % — ABNORMAL LOW (ref 39.0–52.0)
Hemoglobin: 11.6 g/dL — ABNORMAL LOW (ref 13.0–17.0)
Immature Granulocytes: 0 %
Lymphocytes Relative: 4 %
Lymphs Abs: 0.6 10*3/uL — ABNORMAL LOW (ref 0.7–4.0)
MCH: 29.9 pg (ref 26.0–34.0)
MCHC: 32.4 g/dL (ref 30.0–36.0)
MCV: 92.3 fL (ref 80.0–100.0)
Monocytes Absolute: 0.5 10*3/uL (ref 0.1–1.0)
Monocytes Relative: 3 %
Neutro Abs: 14.6 10*3/uL — ABNORMAL HIGH (ref 1.7–7.7)
Neutrophils Relative %: 92 %
Platelets: 233 10*3/uL (ref 150–400)
RBC: 3.88 MIL/uL — ABNORMAL LOW (ref 4.22–5.81)
RDW: 14.9 % (ref 11.5–15.5)
WBC: 16 10*3/uL — ABNORMAL HIGH (ref 4.0–10.5)
nRBC: 0 % (ref 0.0–0.2)

## 2023-09-25 LAB — URINALYSIS, W/ REFLEX TO CULTURE (INFECTION SUSPECTED)
Bacteria, UA: NONE SEEN
Bilirubin Urine: NEGATIVE
Glucose, UA: NEGATIVE mg/dL
Hgb urine dipstick: NEGATIVE
Ketones, ur: NEGATIVE mg/dL
Leukocytes,Ua: NEGATIVE
Nitrite: NEGATIVE
Protein, ur: >=300 mg/dL — AB
Specific Gravity, Urine: 1.013 (ref 1.005–1.030)
pH: 5 (ref 5.0–8.0)

## 2023-09-25 LAB — COMPREHENSIVE METABOLIC PANEL WITH GFR
ALT: 10 U/L (ref 0–44)
AST: 15 U/L (ref 15–41)
Albumin: 3.2 g/dL — ABNORMAL LOW (ref 3.5–5.0)
Alkaline Phosphatase: 76 U/L (ref 38–126)
Anion gap: 11 (ref 5–15)
BUN: 16 mg/dL (ref 8–23)
CO2: 21 mmol/L — ABNORMAL LOW (ref 22–32)
Calcium: 9.2 mg/dL (ref 8.9–10.3)
Chloride: 103 mmol/L (ref 98–111)
Creatinine, Ser: 1.4 mg/dL — ABNORMAL HIGH (ref 0.61–1.24)
GFR, Estimated: 56 mL/min — ABNORMAL LOW (ref >60)
Glucose, Bld: 192 mg/dL — ABNORMAL HIGH (ref 70–99)
Potassium: 4.8 mmol/L (ref 3.5–5.1)
Sodium: 135 mmol/L (ref 135–145)
Total Bilirubin: 0.6 mg/dL (ref 0.0–1.2)
Total Protein: 7 g/dL (ref 6.5–8.1)

## 2023-09-25 LAB — I-STAT CG4 LACTIC ACID, ED
Lactic Acid, Venous: 1.3 mmol/L (ref 0.5–1.9)
Lactic Acid, Venous: 2.2 mmol/L (ref 0.5–1.9)

## 2023-09-25 LAB — PROTIME-INR
INR: 1 (ref 0.8–1.2)
Prothrombin Time: 13.4 s (ref 11.4–15.2)

## 2023-09-25 LAB — RESP PANEL BY RT-PCR (RSV, FLU A&B, COVID)  RVPGX2
Influenza A by PCR: NEGATIVE
Influenza B by PCR: NEGATIVE
Resp Syncytial Virus by PCR: NEGATIVE
SARS Coronavirus 2 by RT PCR: NEGATIVE

## 2023-09-25 LAB — CBG MONITORING, ED: Glucose-Capillary: 158 mg/dL — ABNORMAL HIGH (ref 70–99)

## 2023-09-25 MED ORDER — SALINE SPRAY 0.65 % NA SOLN
1.0000 | Freq: Once | NASAL | Status: AC
  Administered 2023-09-25: 1 via NASAL
  Filled 2023-09-25: qty 44

## 2023-09-25 MED ORDER — LACTATED RINGERS IV SOLN: INTRAVENOUS | Status: DC

## 2023-09-25 MED ORDER — LACTATED RINGERS IV BOLUS (SEPSIS)
1000.0000 mL | Freq: Once | INTRAVENOUS | Status: AC
  Administered 2023-09-25: 1000 mL via INTRAVENOUS

## 2023-09-25 MED ORDER — METRONIDAZOLE 500 MG/100ML IV SOLN
500.0000 mg | Freq: Once | INTRAVENOUS | Status: AC
  Administered 2023-09-25: 500 mg via INTRAVENOUS
  Filled 2023-09-25: qty 100

## 2023-09-25 MED ORDER — VANCOMYCIN HCL IN DEXTROSE 1-5 GM/200ML-% IV SOLN
1000.0000 mg | Freq: Once | INTRAVENOUS | Status: AC
  Administered 2023-09-25: 1000 mg via INTRAVENOUS
  Filled 2023-09-25: qty 200

## 2023-09-25 MED ORDER — SODIUM CHLORIDE 0.9 % IV SOLN
2.0000 g | Freq: Once | INTRAVENOUS | Status: AC
  Administered 2023-09-25: 2 g via INTRAVENOUS
  Filled 2023-09-25: qty 12.5

## 2023-09-25 NOTE — ED Provider Notes (Signed)
 [place] [facility] AT [facility] Provider Note   CSN: [id number] Arrival date & time: 09/25/23  2044     History {Add pertinent medical, surgical, social history, OB history to HPI:1} Chief Complaint  Patient presents with   Altered Mental Status   HPI Gregory [name] is a 63 y.o. 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  Patient brought in by EMS from [facility]  Concerned there was that he was not acting himself and that he had a fever.  At this time, he reports that right lower stump is hurting but has no other complaints.  He does report some redness in the right hand.  States he hit the hand on something but cannot remember what it was a couple days ago.  He is alert and oriented x 3.  Denies abdominal pain, chest pain, nausea vomiting diarrhea, shortness of breath and headache.   Altered Mental Status      Home Medications Prior to Admission medications   Medication Sig Start Date End Date Taking? Authorizing Provider  acetaminophen (TYLENOL) 325 MG tablet Take 2 tablets (650 mg total) by mouth every 6 (six) hours as needed for mild pain (pain score 1-3) (or Fever >/= 101). 09/08/23   Elgergawy, Leana Roe, MD  aspirin EC 81 MG tablet Take 81 mg by mouth daily.    [provider]  atorvastatin (LIPITOR) 80 MG tablet Take 1 tablet (80 mg total) by mouth daily. 09/22/22 01/27/24  Jerald Kief, MD  Cholecalciferol 25 MCG (1000 UT) capsule Take 2,000 Units by mouth daily.    [provider]  Cranberry 450 MG TABS Take 450 mg by mouth daily.    [provider]  Dextran 70-Hypromellose (NATURAL BALANCE TEARS) 0.1-0.3 % SOLN Place 1 drop into both eyes every 4 (four) hours as needed (dry eyes).    [provider]  divalproex (DEPAKOTE) 125 MG  DR tablet Take 125 mg by mouth 3 (three) times daily. For behaviors and agitation    [provider]  docusate sodium (COLACE) 100 MG capsule Take 100 mg by mouth every 12 (twelve) hours as needed (constipation).    [provider]  escitalopram (LEXAPRO) 20 MG tablet Take 1 tablet (20 mg total) by mouth daily. 08/19/23   Rhetta Mura, MD  famotidine (PEPCID) 20 MG tablet Take 1 tablet (20 mg total) by mouth daily. 07/12/23 09/01/24  Jerald Kief, MD  ferrous sulfate 325 (65 FE) MG tablet Take 1 tablet (325 mg total) by mouth 3 (three) times daily with meals. Patient taking differently: Take 325 mg by mouth daily. 01/23/23   Cathren Laine, MD  furosemide (LASIX) 20 MG tablet Take 1 tablet (20 mg total) by mouth daily. 09/09/23 09/08/24  Elgergawy, Leana Roe, MD  insulin glargine (LANTUS SOLOSTAR) 100 UNIT/ML Solostar Pen Inject 5 Units into the skin daily. Patient taking differently: Inject 15 Units into the skin at bedtime. 08/19/23   Rhetta Mura, MD  insulin lispro (HUMALOG) 100 UNIT/ML KwikPen Inject 0-10 Units into the skin See admin instructions. Inject 0-10 units into the skin three times a day and at bedtime, PER SLIDING SCALE:  BGL 151-200 = 2 units 201-250 = 4 units 251-300 = 6 units 301-350 =  8 units 351-400 = 10 units Patient taking differently: Inject 0-10 Units  into the skin See admin instructions. Inject 0-10 units into the skin before meals and at bedtime, PER SLIDING SCALE:  0-150 = 0 units 151-200 = 2 units 201-250 = 4 units 251-300 = 6 units 301-350 =  8 units 351-400 = 10 units 01/31/22   Dahal, Melina Schools, MD  ipratropium-albuterol (DUONEB) 0.5-2.5 (3) MG/3ML SOLN Take 3 mLs by nebulization every 6 (six) hours as needed (shortness of breath, wheezing).    [provider]  lactulose (CHRONULAC) 10 GM/15ML solution Take 30 mLs (20 g total) by mouth 2 (two) times daily. 08/19/23   Rhetta Mura, MD  leptospermum manuka honey (MEDIHONEY)  PSTE paste Apply 1 Application topically daily. 09/09/23   Elgergawy, Leana Roe, MD  lidocaine (LIDODERM) 5 % Place 1 patch onto the skin daily. 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    [provider]  Meth-Hyo-M Bl-Na Phos-Ph Sal (URO-MP) 118 MG CAPS Take 118 mg by mouth 2 (two) times daily.    [provider]  Multiple Vitamin (MULTIVITAMIN WITH MINERALS) TABS tablet Take 1 tablet by mouth daily. 12/25/22   Dorcas Carrow, MD  naloxone [facility]) nasal spray 4 mg/0.1 mL Place 1 spray into the nose See admin instructions. 1 spray alternating nostrils every 5 minutes as needed for opiate overdose.    [provider]  OLANZapine (ZYPREXA) 15 MG tablet Take 1 tablet (15 mg total) by mouth 2 (two) times daily. 08/19/23   Rhetta Mura, MD  omeprazole (PRILOSEC) 20 MG capsule Take 20 mg by mouth at bedtime.    [provider]  OXcarbazepine (TRILEPTAL) 150 MG tablet Take 1 tablet (150 mg total) by mouth 2 (two) times daily. 08/19/23   Rhetta Mura, MD  oxyCODONE (OXY IR/ROXICODONE) 5 MG immediate release tablet Take 1 tablet (5 mg total) by mouth every 6 (six) hours as needed for severe pain (pain score 7-10). 09/08/23   Elgergawy, Leana Roe, MD  pregabalin (LYRICA) 150 MG capsule Take 1 capsule (150 mg total) by mouth 2 (two) times daily. 08/19/23   Rhetta Mura, MD  senna (SENOKOT) 8.6 MG TABS tablet Take 2 tablets by mouth at bedtime.    [provider]  sodium bicarbonate 650 MG tablet Take 650 mg by mouth 2 (two) times daily.    [provider]  tamsulosin (FLOMAX) 0.4 MG CAPS capsule Take 1 capsule (0.4 mg total) by mouth daily. 08/19/23   Rhetta Mura, MD  UNABLE TO FIND Apply 1 application  topically daily. Med Name: Marlene Bast  Apply to buttocks for  14 days (08/21/23-09/04/23)    [provider]      Allergies    Latex    Review of Systems   See HPI   Physical Exam   Vitals:   09/25/23 2048 09/25/23 2102  BP: (!) 171/108   Pulse: (!) 105   Resp: (!) 22   Temp: 98.6 F (37 C) (S) (!) 102.5 F (39.2 C)  SpO2: 93%     CONSTITUTIONAL:  well-appearing, NAD NEURO:  Alert and oriented x 3, CN 3-12 grossly intact, grip strength 5/5 bilaterally, finger-to-nose intact, able to move all 3 extremities and flex and extend the right lower stump EYES:  eyes equal and reactive ENT/NECK:  Supple, no stridor  CARDIO:  tachycardia and regular rhythm, appears well-perfused  PULM:  No respiratory distress, CTAB GI/GU:  non-distended, soft, non tender MSK/SPINE:  No gross deformities, no edema, moves all extremities  SKIN:  no rash, atraumatic  *Additional and/or pertinent findings included in MDM below  ED Results / Procedures / Treatments   Labs (all labs ordered are listed, but only abnormal results are displayed) Labs Reviewed  CBG MONITORING, ED - Abnormal; Notable for the following components:      Result Value   Glucose-Capillary 158 (*)    All other components within normal limits    EKG None  Radiology No results found.  Procedures .Critical Care  Performed by: Gareth Eagle, [name] Authorized by: Gareth Eagle, [name]   Critical care provider statement:    Critical care time (minutes):  30   Critical care was necessary to treat or prevent imminent or life-threatening deterioration of the following conditions:  Sepsis   Critical care was time spent personally by me on the following activities:  Development of treatment plan with patient or surrogate, discussions with consultants, evaluation of patient's response to treatment, examination of patient, ordering and review of laboratory studies, ordering and review of radiographic studies, ordering and performing treatments and interventions, pulse oximetry,  re-evaluation of patient's condition and review of old charts   {Document cardiac monitor, telemetry assessment procedure when appropriate:1}  Medications Ordered in ED Medications - No data to display  ED Course/ Medical Decision Making/ A&P Clinical Course as of 09/25/23 2113  Wed Sep 25, 2023  2105 Chronically ill Recent admit From SNF. SIRS+ 3  [CC]    Clinical Course User Index [CC] Glyn Ade, MD   {   Click here for ABCD2, HEART and other calculatorsREFRESH Note before signing :1}                              Medical Decision Making  Initial Impression and Ddx *** Patient PMH that increases complexity of ED encounter:  ***  Interpretation of Diagnostics I independent reviewed and interpreted the labs as followed: ***  - I independently visualized the following imaging with scope of interpretation limited to determining acute life threatening conditions related to emergency care: ***, which revealed ***  Patient Reassessment and Ultimate Disposition/Management ***  Patient management required discussion with the following services or consulting groups:  {BEROCONSULT:26841}  Complexity of Problems Addressed {BEROCOPA:26833}  Additional Data Reviewed and Analyzed Further history obtained from: {BERODATA:26834}  Patient Encounter Risk Assessment {BERORISK:26838}   {Document critical care time when appropriate:1} {Document review of labs and clinical decision tools ie heart score, Chads2Vasc2 etc:1}  {Document your independent review of radiology images, and any outside records:1} {Document your discussion with family members, caretakers, and with consultants:1} {Document social determinants of health affecting pt's care:1} {Document your decision making why or why not admission, treatments were needed:1} Final Clinical Impression(s) / ED Diagnoses Final diagnoses:  None    Rx / DC Orders ED Discharge Orders     None

## 2023-09-25 NOTE — Sepsis Progress Note (Signed)
 Elink monitoring for the code sepsis protocol.

## 2023-09-25 NOTE — ED Triage Notes (Addendum)
 Pt BIB EMS from [facility] with c/o ams and fever. Pt alert to year, self, and day, not month.  Cbg 122 130HR 162/90  650mg  tylenol with ems

## 2023-09-26 ENCOUNTER — Encounter: Admitting: Orthopedic Surgery

## 2023-09-26 DIAGNOSIS — R6510 Systemic inflammatory response syndrome (SIRS) of non-infectious origin without acute organ dysfunction: Secondary | ICD-10-CM | POA: Diagnosis present

## 2023-09-26 DIAGNOSIS — N319 Neuromuscular dysfunction of bladder, unspecified: Secondary | ICD-10-CM | POA: Diagnosis present

## 2023-09-26 DIAGNOSIS — Z85828 Personal history of other malignant neoplasm of skin: Secondary | ICD-10-CM | POA: Diagnosis not present

## 2023-09-26 DIAGNOSIS — Y92129 Unspecified place in nursing home as the place of occurrence of the external cause: Secondary | ICD-10-CM | POA: Diagnosis not present

## 2023-09-26 DIAGNOSIS — E1169 Type 2 diabetes mellitus with other specified complication: Secondary | ICD-10-CM | POA: Diagnosis present

## 2023-09-26 DIAGNOSIS — D631 Anemia in chronic kidney disease: Secondary | ICD-10-CM | POA: Diagnosis present

## 2023-09-26 DIAGNOSIS — E1122 Type 2 diabetes mellitus with diabetic chronic kidney disease: Secondary | ICD-10-CM | POA: Diagnosis present

## 2023-09-26 DIAGNOSIS — L97529 Non-pressure chronic ulcer of other part of left foot with unspecified severity: Secondary | ICD-10-CM | POA: Diagnosis present

## 2023-09-26 DIAGNOSIS — E11622 Type 2 diabetes mellitus with other skin ulcer: Secondary | ICD-10-CM | POA: Diagnosis present

## 2023-09-26 DIAGNOSIS — J449 Chronic obstructive pulmonary disease, unspecified: Secondary | ICD-10-CM | POA: Diagnosis present

## 2023-09-26 DIAGNOSIS — I5032 Chronic diastolic (congestive) heart failure: Secondary | ICD-10-CM | POA: Diagnosis present

## 2023-09-26 DIAGNOSIS — Z89511 Acquired absence of right leg below knee: Secondary | ICD-10-CM | POA: Diagnosis not present

## 2023-09-26 DIAGNOSIS — Z794 Long term (current) use of insulin: Secondary | ICD-10-CM | POA: Diagnosis not present

## 2023-09-26 DIAGNOSIS — Z8673 Personal history of transient ischemic attack (TIA), and cerebral infarction without residual deficits: Secondary | ICD-10-CM

## 2023-09-26 DIAGNOSIS — G894 Chronic pain syndrome: Secondary | ICD-10-CM | POA: Diagnosis present

## 2023-09-26 DIAGNOSIS — N1831 Chronic kidney disease, stage 3a: Secondary | ICD-10-CM | POA: Diagnosis present

## 2023-09-26 DIAGNOSIS — Z85820 Personal history of malignant melanoma of skin: Secondary | ICD-10-CM | POA: Diagnosis not present

## 2023-09-26 DIAGNOSIS — L03116 Cellulitis of left lower limb: Secondary | ICD-10-CM | POA: Diagnosis present

## 2023-09-26 DIAGNOSIS — E785 Hyperlipidemia, unspecified: Secondary | ICD-10-CM | POA: Diagnosis present

## 2023-09-26 DIAGNOSIS — F32A Depression, unspecified: Secondary | ICD-10-CM | POA: Diagnosis present

## 2023-09-26 DIAGNOSIS — N183 Chronic kidney disease, stage 3 unspecified: Secondary | ICD-10-CM | POA: Diagnosis present

## 2023-09-26 DIAGNOSIS — Z89611 Acquired absence of right leg above knee: Secondary | ICD-10-CM | POA: Diagnosis not present

## 2023-09-26 DIAGNOSIS — E1149 Type 2 diabetes mellitus with other diabetic neurological complication: Secondary | ICD-10-CM | POA: Diagnosis present

## 2023-09-26 DIAGNOSIS — I890 Lymphedema, not elsewhere classified: Secondary | ICD-10-CM | POA: Diagnosis present

## 2023-09-26 DIAGNOSIS — Z8616 Personal history of COVID-19: Secondary | ICD-10-CM | POA: Diagnosis not present

## 2023-09-26 DIAGNOSIS — I130 Hypertensive heart and chronic kidney disease with heart failure and stage 1 through stage 4 chronic kidney disease, or unspecified chronic kidney disease: Secondary | ICD-10-CM | POA: Diagnosis present

## 2023-09-26 DIAGNOSIS — G9341 Metabolic encephalopathy: Secondary | ICD-10-CM | POA: Diagnosis present

## 2023-09-26 LAB — GLUCOSE, CAPILLARY: Glucose-Capillary: 111 mg/dL — ABNORMAL HIGH (ref 70–99)

## 2023-09-26 LAB — GASTROINTESTINAL PANEL BY PCR, STOOL (REPLACES STOOL CULTURE)

## 2023-09-26 LAB — BASIC METABOLIC PANEL WITH GFR
Anion gap: 8 (ref 5–15)
BUN: 16 mg/dL (ref 8–23)
CO2: 24 mmol/L (ref 22–32)
Calcium: 9 mg/dL (ref 8.9–10.3)
Chloride: 106 mmol/L (ref 98–111)
Creatinine, Ser: 1.32 mg/dL — ABNORMAL HIGH (ref 0.61–1.24)
GFR, Estimated: >60 mL/min (ref >60)
Glucose, Bld: 128 mg/dL — ABNORMAL HIGH (ref 70–99)
Potassium: 4.5 mmol/L (ref 3.5–5.1)
Sodium: 138 mmol/L (ref 135–145)

## 2023-09-26 LAB — CBG MONITORING, ED
Glucose-Capillary: 127 mg/dL — ABNORMAL HIGH (ref 70–99)
Glucose-Capillary: 100 mg/dL — ABNORMAL HIGH (ref 70–99)
Glucose-Capillary: 103 mg/dL — ABNORMAL HIGH (ref 70–99)
Glucose-Capillary: 145 mg/dL — ABNORMAL HIGH (ref 70–99)

## 2023-09-26 LAB — RESPIRATORY PANEL BY PCR

## 2023-09-26 LAB — C DIFFICILE QUICK SCREEN W PCR REFLEX
C Diff antigen: NEGATIVE
C Diff interpretation: NOT DETECTED
C Diff toxin: NEGATIVE

## 2023-09-26 LAB — CBC
HCT: 30.9 % — ABNORMAL LOW (ref 39.0–52.0)
Hemoglobin: 10.1 g/dL — ABNORMAL LOW (ref 13.0–17.0)
MCH: 30.2 pg (ref 26.0–34.0)
MCHC: 32.7 g/dL (ref 30.0–36.0)
MCV: 92.5 fL (ref 80.0–100.0)
Platelets: 202 10*3/uL (ref 150–400)
RBC: 3.34 MIL/uL — ABNORMAL LOW (ref 4.22–5.81)
RDW: 15.1 % (ref 11.5–15.5)
WBC: 10.7 10*3/uL — ABNORMAL HIGH (ref 4.0–10.5)
nRBC: 0 % (ref 0.0–0.2)

## 2023-09-26 MED ORDER — TAMSULOSIN HCL 0.4 MG PO CAPS
0.4000 mg | ORAL_CAPSULE | Freq: Every day | ORAL | Status: DC
  Administered 2023-09-26 – 2023-09-28 (×3): 0.4 mg via ORAL
  Filled 2023-09-26 (×3): qty 1

## 2023-09-26 MED ORDER — ACETAMINOPHEN 650 MG RE SUPP
650.0000 mg | Freq: Four times a day (QID) | RECTAL | Status: DC | PRN
  Administered 2023-09-26: 650 mg via RECTAL
  Filled 2023-09-26: qty 1

## 2023-09-26 MED ORDER — LACTATED RINGERS IV SOLN: INTRAVENOUS | Status: AC

## 2023-09-26 MED ORDER — ACETAMINOPHEN 325 MG PO TABS
650.0000 mg | ORAL_TABLET | Freq: Four times a day (QID) | ORAL | Status: DC | PRN
  Administered 2023-09-26 – 2023-09-27 (×2): 650 mg via ORAL
  Filled 2023-09-26 (×2): qty 2

## 2023-09-26 MED ORDER — ONDANSETRON HCL 4 MG/2ML IJ SOLN
4.0000 mg | Freq: Four times a day (QID) | INTRAMUSCULAR | Status: DC | PRN
  Administered 2023-09-26 – 2023-09-27 (×3): 4 mg via INTRAVENOUS
  Filled 2023-09-26 (×3): qty 2

## 2023-09-26 MED ORDER — ATORVASTATIN CALCIUM 80 MG PO TABS
80.0000 mg | ORAL_TABLET | Freq: Every day | ORAL | Status: DC
  Administered 2023-09-26 – 2023-09-28 (×3): 80 mg via ORAL
  Filled 2023-09-26: qty 1
  Filled 2023-09-26: qty 2
  Filled 2023-09-26: qty 1

## 2023-09-26 MED ORDER — ENOXAPARIN SODIUM 40 MG/0.4ML IJ SOSY
40.0000 mg | PREFILLED_SYRINGE | INTRAMUSCULAR | Status: DC
  Administered 2023-09-27: 40 mg via SUBCUTANEOUS
  Filled 2023-09-26: qty 0.4

## 2023-09-26 MED ORDER — ESCITALOPRAM OXALATE 20 MG PO TABS
20.0000 mg | ORAL_TABLET | Freq: Every day | ORAL | Status: DC
Start: 2023-09-26 — End: 2023-09-28
  Administered 2023-09-26 – 2023-09-28 (×3): 20 mg via ORAL
  Filled 2023-09-26 (×4): qty 1

## 2023-09-26 MED ORDER — ONDANSETRON HCL 4 MG PO TABS
4.0000 mg | ORAL_TABLET | Freq: Four times a day (QID) | ORAL | Status: DC | PRN
  Administered 2023-09-27 – 2023-09-28 (×2): 4 mg via ORAL
  Filled 2023-09-26 (×2): qty 1

## 2023-09-26 MED ORDER — OXYCODONE HCL 5 MG PO TABS
5.0000 mg | ORAL_TABLET | Freq: Four times a day (QID) | ORAL | Status: DC | PRN
  Administered 2023-09-27 – 2023-09-28 (×4): 5 mg via ORAL
  Filled 2023-09-26 (×5): qty 1

## 2023-09-26 MED ORDER — SODIUM CHLORIDE 0.9% FLUSH
3.0000 mL | Freq: Two times a day (BID) | INTRAVENOUS | Status: DC
  Administered 2023-09-26 – 2023-09-28 (×4): 3 mL via INTRAVENOUS

## 2023-09-26 MED ORDER — VANCOMYCIN HCL 750 MG/150ML IV SOLN
750.0000 mg | Freq: Two times a day (BID) | INTRAVENOUS | Status: DC
  Administered 2023-09-26 – 2023-09-28 (×4): 750 mg via INTRAVENOUS
  Filled 2023-09-26 (×5): qty 150

## 2023-09-26 MED ORDER — INSULIN ASPART 100 UNIT/ML IJ SOLN
0.0000 [IU] | INTRAMUSCULAR | Status: DC
  Administered 2023-09-26 – 2023-09-28 (×6): 1 [IU] via SUBCUTANEOUS

## 2023-09-26 MED ORDER — SODIUM CHLORIDE 0.9 % IV SOLN
1.0000 g | Freq: Three times a day (TID) | INTRAVENOUS | Status: DC
  Administered 2023-09-26 – 2023-09-28 (×6): 1 g via INTRAVENOUS
  Filled 2023-09-26 (×9): qty 20

## 2023-09-26 MED ORDER — IPRATROPIUM-ALBUTEROL 0.5-2.5 (3) MG/3ML IN SOLN
3.0000 mL | Freq: Four times a day (QID) | RESPIRATORY_TRACT | Status: DC | PRN
Start: 2023-09-26 — End: 2023-09-28

## 2023-09-26 MED ORDER — SODIUM BICARBONATE 650 MG PO TABS
650.0000 mg | ORAL_TABLET | Freq: Two times a day (BID) | ORAL | Status: DC
  Administered 2023-09-26 – 2023-09-28 (×4): 650 mg via ORAL
  Filled 2023-09-26 (×4): qty 1

## 2023-09-26 MED ORDER — ASPIRIN 81 MG PO TBEC
81.0000 mg | DELAYED_RELEASE_TABLET | Freq: Every day | ORAL | Status: DC
  Administered 2023-09-26 – 2023-09-28 (×3): 81 mg via ORAL
  Filled 2023-09-26 (×3): qty 1

## 2023-09-26 MED ORDER — OXYCODONE HCL 5 MG PO TABS
15.0000 mg | ORAL_TABLET | Freq: Three times a day (TID) | ORAL | Status: DC | PRN
  Administered 2023-09-26: 15 mg via ORAL
  Filled 2023-09-26: qty 3

## 2023-09-26 MED ORDER — METRONIDAZOLE 500 MG/100ML IV SOLN: 500.0000 mg | Freq: Two times a day (BID) | INTRAVENOUS | Status: DC

## 2023-09-26 MED ORDER — OXCARBAZEPINE 150 MG PO TABS
150.0000 mg | ORAL_TABLET | Freq: Two times a day (BID) | ORAL | Status: DC
  Administered 2023-09-26 – 2023-09-28 (×4): 150 mg via ORAL
  Filled 2023-09-26 (×5): qty 1

## 2023-09-26 MED ORDER — OXYCODONE HCL 5 MG PO TABS
15.0000 mg | ORAL_TABLET | Freq: Three times a day (TID) | ORAL | Status: DC
  Administered 2023-09-26 – 2023-09-28 (×6): 15 mg via ORAL
  Filled 2023-09-26 (×6): qty 3

## 2023-09-26 NOTE — H&P (Signed)
 History and Physical    Gregory [name]:[id number] DOB: 08-Jun-1961 DOA: 09/25/2023  PCP: [name], Provider, MD  Patient coming from: [name]  I have personally briefly reviewed patient's old medical records in [facility]  Chief Complaint: Altered mental status, fever  HPI: Gregory [name] is a 63 y.o. 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  Patient is unable to provide any history due to encephalopathy and is otherwise obtained by EDP and chart review.  Patient was frequently admitted, most recently 3/17-3/23 for acute metabolic encephalopathy felt multifactorial from polypharmacy versus hyponatremia versus dehydration.  Patient improved after holding all narcotics, psychotic and neuroleptic medications.  He became agitated after he started to wake up and prior medications were gradually resumed to his facility regimen of Zyprexa, Trileptal, and Depakote.  MS Contin 15 units every 8 hours was changed to every 12 hours with addition of oxycodone 5 mg as needed every 6 hours.  Patient was sent to the ED from [facility] with reported fever and altered mental status.  At time of admission patient is excessively somnolent.  Briefly awakens to sternal rub but otherwise not participating in conversation or following commands.  ED Course  Labs/Imaging on admission: I have personally reviewed following labs and imaging studies.  Initial vitals showed BP 171/108, pulse 105, RR 22, temp 98.6 F, SpO2 93% on room air.  Tmax 102.5 F rectally.  Labs showed WBC 16.0, hemoglobin 11.6,  platelets 233,000, sodium 135, potassium 4.8, bicarb 21, BUN 16, creatinine 1.40, serum glucose 192, LFTs within normal limits, lactic acid 2.2 > 1.3.  UA negative for UTI.  SARS-CoV-2, influenza, RSV PCR negative.  Blood cultures in process.  Right hand x-ray negative for acute displaced fracture or dislocation.  Portable chest x-ray negative for focal consolidation, edema, effusion.  Patient was given 1 L LR, IV vancomycin, cefepime, Flagyl.  The hospitalist service was consulted to admit.  Review of Systems:  Unable to obtain due to encephalopathy.  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    Chronic pain syndrome    COPD (chronic obstructive pulmonary disease) (HCC)    DDD (degenerative disc disease), lumbar    Depression    major depressive disorder    Diabetes mellitus without complication (HCC)    type 2    Diabetic ulcer of toe of left foot (HCC) 10/02/2016   Difficult intubation    Difficulty in walking, not elsewhere classified    Epidural abscess 10/02/2016   Foot drop, bilateral    Foot  drop, right 12/27/2015   GERD (gastroesophageal reflux disease)    Herpesviral vesicular dermatitis    History of COVID-19 03/15/2022   Hyperlipidemia    Hyperlipidemia    Hypertension    Insomnia    Malignant melanoma of other parts of face (HCC)    Melanoma (HCC)    MRSA bacteremia    Muscle weakness (generalized)    Nasal congestion    Necrosis of toe (HCC) 07/08/2018    Neuromuscular dysfunction of bladder    Osteomyelitis (HCC)    Osteomyelitis (HCC)    Osteomyelitis (HCC)    right BKA   Other idiopathic peripheral autonomic neuropathy    Retention of urine, unspecified    Squamous cell carcinoma of skin    Stroke (HCC)    Unsteadiness on feet    Urinary retention    Urinary retention    Urinary tract infection    Wears dentures    Wears glasses     Past Surgical History:  Procedure Laterality Date   AMPUTATION Left 03/29/2017   Procedure: LEFT GREAT TOE AMPUTATION AT METATARSOPHALANGEAL JOINT;  Surgeon: Nadara Mustard, MD;  Location: [facility] OR;  Service: Orthopedics;  Laterality: Left;   AMPUTATION Right 03/29/2017   Procedure: RIGHT BELOW KNEE AMPUTATION;  Surgeon: Nadara Mustard, MD;  Location: [facility] OR;  Service: Orthopedics;  Laterality: Right;   AMPUTATION Left 06/06/2018   Procedure: LEFT 2ND TOE AMPUTATION;  Surgeon: Nadara Mustard, MD;  Location: [facility] OR;  Service: Orthopedics;  Laterality: Left;   AMPUTATION Right 11/19/2018   Procedure: AMPUTATION ABOVE KNEE;  Surgeon: Nadara Mustard, MD;  Location: [facility] OR;  Service: Orthopedics;  Laterality: Right;   ANTERIOR CERVICAL CORPECTOMY N/A 11/25/2015   Procedure: ANTERIOR CERVICAL FIVE CORPECTOMY Cervical four - six fusion;  Surgeon: Lisbeth Renshaw, MD;  Location: [facility];  Service: Neurosurgery;  Laterality: N/A;  ANTERIOR CERVICAL FIVE CORPECTOMY Cervical four - six fusion   APPLICATION OF WOUND VAC Right 10/22/2018   Procedure: Application Of Wound Vac;  Surgeon: Nadara Mustard, MD;  Location: [facility] OR;  Service: Orthopedics;  Laterality: Right;   CYSTOSCOPY WITH BIOPSY N/A 05/01/2022   Procedure: CYSTOSCOPY WITH URETHRAL BIOPSY;  Surgeon: Noel Christmas, MD;  Location: [facility] ORS;  Service: Urology;  Laterality: N/A;  1 HR   CYSTOSCOPY WITH RETROGRADE URETHROGRAM N/A 04/25/2018   Procedure: CYSTOSCOPY WITH RETROGRADE URETHROGRAM/ BALLOON DILATION;  Surgeon: Ihor Gully, MD;  Location: [facility] ORS;   Service: Urology;  Laterality: N/A;   CYSTOSCOPY WITH URETHRAL DILATATION N/A 05/01/2022   Procedure: BALLOON DILATION WITH  OPTILUME;  Surgeon: Noel Christmas, MD;  Location: [facility] ORS;  Service: Urology;  Laterality: N/A;   LAPAROTOMY N/A 12/03/2022   Procedure: EXPLORATORY LAPAROTOMY  total abdominal colectomy with end ileostomy;  Surgeon: Kinsinger, De Blanch, MD;  Location: [facility] OR;  Service: General;  Laterality: N/A;   MULTIPLE TOOTH EXTRACTIONS     POSTERIOR CERVICAL FUSION/FORAMINOTOMY N/A 11/29/2015   Procedure: Cervical Three-Cervical Seven Posterior Cervical Laminectomy with Fusion;  Surgeon: Lisbeth Renshaw, MD;  Location: [facility];  Service: Neurosurgery;  Laterality: N/A;  Cervical Three-Cervical Seven Posterior Cervical Laminectomy with Fusion   STUMP REVISION Right 10/22/2018   Procedure: REVISION RIGHT BELOW KNEE AMPUTATION;  Surgeon: Nadara Mustard, MD;  Location: [facility] OR;  Service: Orthopedics;  Laterality: Right;   STUMP REVISION Right 12/05/2018   Procedure: Revision Right Above Knee Amputation;  Surgeon: Nadara Mustard, MD;  Location: [facility] OR;  Service: Orthopedics;  Laterality: Right;   STUMP REVISION Right 05/29/2019   Procedure: REVISION RIGHT ABOVE KNEE AMPUTATION;  Surgeon: Nadara Mustard, MD;  Location: [facility] OR;  Service: Orthopedics;  Laterality: Right;   STUMP REVISION Right 06/24/2019   Procedure: STUMP REVISION;  Surgeon: Nadara Mustard, MD;  Location: [facility] OR;  Service: Orthopedics;  Laterality: Right;    Social History: Unable to obtain due to encephalopathy.  Allergies  Allergen Reactions   Latex Other (See Comments)    Unknown reaction    Family History  Problem Relation Age of Onset   Diabetes Mother    Heart disease Mother    Cancer Mother    Cancer Father    Bone cancer Father    Prostate cancer Father    Cancer Paternal Grandfather    Cancer Other    Diabetes Other      Prior to Admission medications   Medication Sig Start Date End Date Taking?  Authorizing Provider  acetaminophen (TYLENOL) 325 MG tablet Take 2 tablets (650 mg total) by mouth every 6 (six) hours as needed for mild pain (pain score 1-3) (or Fever >/= 101). 09/08/23   Elgergawy, Leana Roe, MD  aspirin EC 81 MG tablet Take 81 mg by mouth daily.    [provider]  atorvastatin (LIPITOR) 80 MG tablet Take 1 tablet (80 mg total) by mouth daily. 09/22/22 01/27/24  Jerald Kief, MD  Cholecalciferol 25 MCG (1000 UT) capsule Take 2,000 Units by mouth daily.    [provider]  Cranberry 450 MG TABS Take 450 mg by mouth daily.    [provider]  Dextran 70-Hypromellose (NATURAL BALANCE TEARS) 0.1-0.3 % SOLN Place 1 drop into both eyes every 4 (four) hours as needed (dry eyes).    [provider]  divalproex (DEPAKOTE) 125 MG DR tablet Take 125 mg by mouth 3 (three) times daily. For behaviors and agitation    [provider]  docusate sodium (COLACE) 100 MG capsule Take 100 mg by mouth every 12 (twelve) hours as needed (constipation).    [provider]  escitalopram (LEXAPRO) 20 MG tablet Take 1 tablet (20 mg total) by mouth daily. 08/19/23   Rhetta Mura, MD  famotidine (PEPCID) 20 MG tablet Take 1 tablet (20 mg total) by mouth daily. 07/12/23 09/01/24  Jerald Kief, MD  ferrous sulfate 325 (65 FE) MG tablet Take 1 tablet (325 mg total) by mouth 3 (three) times daily with meals. Patient taking differently: Take 325 mg by mouth daily. 01/23/23   Cathren Laine, MD  furosemide (LASIX) 20 MG tablet Take 1 tablet (20 mg total) by mouth daily. 09/09/23 09/08/24  Elgergawy, Leana Roe, MD  insulin glargine (LANTUS SOLOSTAR) 100 UNIT/ML Solostar Pen Inject 5 Units into the skin daily. Patient taking differently: Inject 15 Units into the skin at bedtime. 08/19/23   Rhetta Mura, MD  insulin lispro (HUMALOG) 100 UNIT/ML KwikPen Inject 0-10 Units into the skin See admin instructions. Inject 0-10 units into the skin three times a day  and at bedtime, PER SLIDING SCALE:  BGL 151-200 = 2 units 201-250 = 4 units 251-300 = 6 units 301-350 =  8 units 351-400 = 10 units Patient taking differently: Inject 0-10 Units into the skin See admin instructions. Inject 0-10 units into the skin before meals and at bedtime, PER SLIDING SCALE:  0-150 = 0 units 151-200 = 2 units 201-250 = 4 units 251-300 = 6 units 301-350 =  8  units 351-400 = 10 units 01/31/22   Dahal, Melina Schools, MD  ipratropium-albuterol (DUONEB) 0.5-2.5 (3) MG/3ML SOLN Take 3 mLs by nebulization every 6 (six) hours as needed (shortness of breath, wheezing).    [provider]  lactulose (CHRONULAC) 10 GM/15ML solution Take 30 mLs (20 g total) by mouth 2 (two) times daily. 08/19/23   Rhetta Mura, MD  leptospermum manuka honey (MEDIHONEY) PSTE paste Apply 1 Application topically daily. 09/09/23   Elgergawy, Leana Roe, MD  lidocaine (LIDODERM) 5 % Place 1 patch onto the skin daily. 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    [provider]  Meth-Hyo-M Bl-Na Phos-Ph Sal (URO-MP) 118 MG CAPS Take 118 mg by mouth 2 (two) times daily.    [provider]  Multiple Vitamin (MULTIVITAMIN WITH MINERALS) TABS tablet Take 1 tablet by mouth daily. 12/25/22   Dorcas Carrow, MD  naloxone [facility]) nasal spray 4 mg/0.1 mL Place 1 spray into the nose See admin instructions. 1 spray alternating nostrils every 5 minutes as needed for opiate overdose.    [provider]  OLANZapine (ZYPREXA) 15 MG tablet Take 1 tablet (15 mg total) by mouth 2 (two) times daily. 08/19/23   Rhetta Mura, MD  omeprazole (PRILOSEC) 20 MG capsule Take 20 mg by mouth at bedtime.    [provider]  OXcarbazepine (TRILEPTAL) 150 MG tablet Take 1 tablet (150 mg total) by mouth 2 (two) times  daily. 08/19/23   Rhetta Mura, MD  oxyCODONE (OXY IR/ROXICODONE) 5 MG immediate release tablet Take 1 tablet (5 mg total) by mouth every 6 (six) hours as needed for severe pain (pain score 7-10). 09/08/23   Elgergawy, Leana Roe, MD  pregabalin (LYRICA) 150 MG capsule Take 1 capsule (150 mg total) by mouth 2 (two) times daily. 08/19/23   Rhetta Mura, MD  senna (SENOKOT) 8.6 MG TABS tablet Take 2 tablets by mouth at bedtime.    [provider]  sodium bicarbonate 650 MG tablet Take 650 mg by mouth 2 (two) times daily.    [provider]  tamsulosin (FLOMAX) 0.4 MG CAPS capsule Take 1 capsule (0.4 mg total) by mouth daily. 08/19/23   Rhetta Mura, MD  UNABLE TO FIND Apply 1 application  topically daily. Med Name: Marlene Bast  Apply to buttocks for 14 days (08/21/23-09/04/23)    [provider]    Physical Exam: Vitals:   09/25/23 2130 09/25/23 2145 09/25/23 2230 09/25/23 2345  BP: (!) 143/67 (!) 142/66 (!) 115/57 (!) 142/59  Pulse: 94 92 85 80  Resp: 18 20 20 18   Temp:      TempSrc:      SpO2: 93% 92% 93% 97%  Weight:      Height:       Limited exam due to encephalopathy Constitutional: Chronically ill-appearing obese patient resting in bed with head elevated.  Hypersomnolent. Eyes: PERRL, lids and conjunctivae normal ENMT: Mucous membranes are moist. Posterior pharynx clear of any exudate or lesions.Normal dentition.  Neck: normal, supple, no masses. Respiratory: clear to auscultation anteriorly. Normal respiratory effort. No accessory muscle use.  Cardiovascular: Regular rate and rhythm, no murmurs / rubs / gallops.  Lymphedema LLE Abdomen: no obvious tenderness elicited with palpation, end ileostomy in place with brown watery stool Musculoskeletal: S/p  right AKA and left first toe amputation Skin: Lymphedema LLE with ulcerated wounds to the tip of toes on the left Neurologic: Exam limited due to encephalopathy.  Briefly awakens to sternal  rub otherwise nonparticipatory Psychiatric: Hypersomnolent  EKG: Personally reviewed. Sinus rhythm, rate 105, no acute ischemic changes, motion artifact present.  Rate is faster when compared to prior.  Assessment/Plan Principal Problem:   SIRS (systemic inflammatory response syndrome) (HCC) Active Problems:   COPD (chronic obstructive pulmonary disease) (HCC)   HTN (hypertension)   Type II diabetes mellitus with neurological manifestations (HCC)   Mood disorder (HCC)   Pain of amputation stump of right lower extremity (HCC)   BPH (benign prostatic hyperplasia)   Anemia of chronic disease   Acute metabolic encephalopathy   Chronic kidney disease, stage 3a (HCC)   History of CVA (cerebrovascular accident)   JOAQUIM TOLEN is a 63 y.o. 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  Assessment and Plan: SIRS: Patient presenting with fever, tachycardia, tachypnea, leukocytosis.  No obvious infectious etiology however LLE cellulitis and/or GI infection on differential given erythema to LLE and watery output from ileostomy however unclear if these are chronic or acute changes.  UA negative for UTI.  COVID, influenza, RSV negative.  CXR negative for pneumonia. - Follow blood cultures - Check C. difficile and GI pathogen panels - Continue empiric IV vancomycin, cefepime, Flagyl - Continue IV fluid hydration overnight  Acute metabolic encephalopathy with history of recurrent delirium and encephalopathy: Patient hypersomnolent on admission, briefly opens eyes and grunts to sternal rub.  All sedating meds are on hold.  Chronic LLE lymphedema with multiple diabetic ulcers and  necrosis: Chronic issue with question of acute superimposed cellulitis as above.  Continue antibiotics and follow blood cultures.  Ischemic colon s/p total abdominal colectomy with end ileostomy: Diarrheal output noted on admission as above.  Check C. difficile and GI pathogen panels.  Depression/anxiety/agitation: Home meds on hold due to encephalopathy and hypersomnolence.  CKD stage IIIa: Renal function stable.  Insulin-dependent type 2 diabetes: Last hemoglobin A1c 6.4% on 06/27/2023.  Placed on SSI every 4 hours while NPO.  Anemia of chronic disease: Hemoglobin improved to 11.6 compared to previous 9.0.  Hypertension: Holding antihypertensives due to risk of hypotension in setting of SIRS.  Chronic pain at right AKA stump: Home meds on hold as above.  COPD: Continue DuoNebs as needed.  History of right PCA embolic CVA January 2025: Resume aspirin and atorvastatin when able to take oral meds.  BPH with history of recurrent urinary retention: Resume Flomax when taking oral meds.  Bladder scan and In-N-Out cath as needed.   DVT prophylaxis: enoxaparin (LOVENOX) injection 40 mg Start: 09/26/23 1400 Code Status: Full code Family Communication: None present on admission Disposition Plan: From SNF and likely return to SNF pending clinical progress Consults called: None Severity of Illness: The appropriate patient status for this patient is INPATIENT. Inpatient status is judged to be reasonable and necessary in order to provide the required intensity of service to ensure the patient's safety. The patient's presenting symptoms, physical exam findings, and initial radiographic and laboratory data in the context of their chronic comorbidities is felt to place them at high risk for further clinical deterioration. Furthermore,  it is not anticipated that the patient will be medically stable for discharge from the hospital within 2 midnights of admission.   * I certify that at the point  of admission it is my clinical judgment that the patient will require inpatient hospital care spanning beyond 2 midnights from the point of admission due to high intensity of service, high risk for further deterioration and high frequency of surveillance required.Darreld Mclean MD [facility]  If 7PM-7AM, please contact night-coverage [email or web address]  09/26/2023, 1:55 AM

## 2023-09-26 NOTE — Hospital Course (Addendum)
 Gregory [name] is a 63 y.o. 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 Patient managed on Vancomycin and meropenem. No source identified. Patient discharged on doxycycline and Augmentin.

## 2023-09-26 NOTE — ED Provider Notes (Signed)
  Physical Exam  BP (!) 142/59   Pulse 80   Temp (S) (!) 102.5 F (39.2 C) (Rectal)   Resp 18   Ht 6' (1.829 m)   Wt 90.8 kg   SpO2 97%   BMI 27.15 kg/m   Physical Exam  Procedures  Procedures  ED Course / MDM   Clinical Course as of 09/26/23 0126  Wed Sep 25, 2023  2105 Chronically ill Recent admit From SNF. SIRS+ 3  [CC]    Clinical Course User Index [CC] Glyn Ade, MD   Medical Decision Making Amount and/or Complexity of Data Reviewed Labs: ordered. Radiology: ordered.  Risk OTC drugs. Prescription drug management.   Patient care assumed at shift handoff from Riki Sheer, [name]. Plan for admission for sepsis after imaging results.   No acute findings on imaging. Requested consultation with hospitalist, Dr. Allena Katz, who agrees to see patient for admission.        Pamala Duffel 09/26/23 0129    Marily Memos, MD 09/26/23 [phone]

## 2023-09-26 NOTE — ED Notes (Signed)
 Pt refused blood draw, notified nurse.  KM

## 2023-09-26 NOTE — Plan of Care (Signed)
  Problem: Education: Goal: Ability to describe self-care measures that may prevent or decrease complications (Diabetes Survival Skills Education) will improve Outcome: Not Progressing Goal: Individualized Educational Video(s) Outcome: Not Progressing   Problem: Coping: Goal: Ability to adjust to condition or change in health will improve Outcome: Not Progressing   Problem: Fluid Volume: Goal: Ability to maintain a balanced intake and output will improve Outcome: Not Progressing   Problem: Health Behavior/Discharge Planning: Goal: Ability to identify and utilize available resources and services will improve Outcome: Not Progressing Goal: Ability to manage health-related needs will improve Outcome: Not Progressing   Problem: Metabolic: Goal: Ability to maintain appropriate glucose levels will improve Outcome: Not Progressing   Problem: Nutritional: Goal: Maintenance of adequate nutrition will improve Outcome: Not Progressing Goal: Progress toward achieving an optimal weight will improve Outcome: Not Progressing   Problem: Skin Integrity: Goal: Risk for impaired skin integrity will decrease Outcome: Not Progressing   Problem: Tissue Perfusion: Goal: Adequacy of tissue perfusion will improve Outcome: Not Progressing   Problem: Fluid Volume: Goal: Hemodynamic stability will improve Outcome: Not Progressing   Problem: Clinical Measurements: Goal: Diagnostic test results will improve Outcome: Not Progressing Goal: Signs and symptoms of infection will decrease Outcome: Not Progressing   Problem: Respiratory: Goal: Ability to maintain adequate ventilation will improve Outcome: Not Progressing   Problem: Education: Goal: Knowledge of General Education information will improve Description: Including pain rating scale, medication(s)/side effects and non-pharmacologic comfort measures Outcome: Not Progressing   Problem: Health Behavior/Discharge Planning: Goal: Ability  to manage health-related needs will improve Outcome: Not Progressing   Problem: Clinical Measurements: Goal: Ability to maintain clinical measurements within normal limits will improve Outcome: Not Progressing Goal: Will remain free from infection Outcome: Not Progressing Goal: Diagnostic test results will improve Outcome: Not Progressing Goal: Respiratory complications will improve Outcome: Not Progressing Goal: Cardiovascular complication will be avoided Outcome: Not Progressing   Problem: Activity: Goal: Risk for activity intolerance will decrease Outcome: Not Progressing   Problem: Nutrition: Goal: Adequate nutrition will be maintained Outcome: Not Progressing   Problem: Coping: Goal: Level of anxiety will decrease Outcome: Not Progressing   Problem: Elimination: Goal: Will not experience complications related to bowel motility Outcome: Not Progressing Goal: Will not experience complications related to urinary retention Outcome: Not Progressing   Problem: Pain Managment: Goal: General experience of comfort will improve and/or be controlled Outcome: Not Progressing   Problem: Safety: Goal: Ability to remain free from injury will improve Outcome: Not Progressing   Problem: Skin Integrity: Goal: Risk for impaired skin integrity will decrease Outcome: Not Progressing

## 2023-09-26 NOTE — Progress Notes (Signed)
 Pt admitted from ED.  RLQ ostomy intact and funcitoning.  VSS.  A/Ox4.  Open sores noted on LLE.  Pt adamantly refused dressing of any kind.  Soiled linens removed from under Pt and external catheter placed to suction.  Pt oriented to room and assisted to order food.  Pt request increasing frequency of pain medication and MD has been aware via secure chat.  Moisture barrier applied to scrotum and sacrum for MASD.  Will cont plan of care.

## 2023-09-26 NOTE — ED Notes (Signed)
 Previous ostomy bag removed and replaced w/ new one. Primary and RN and previous RN changed pt and given new sheets

## 2023-09-26 NOTE — Progress Notes (Addendum)
 Pharmacy Antibiotic Note  Gregory [name] is a 63 y.o. male admitted on 09/25/2023 with fever/AMS with concerns for sepsis with unknown source.  Pharmacy has been consulted for meropenem/vancomycin dosing.   -06/2023: ESBL E. Coli urine culture, Serratia/Kleb pneumo trach aspirate on merrem and doxycycline -UA negative for UTI, CXR negative for pneumonia -WBC 16, Tmax 102.5, sCr 1.4, blood cultures collected  Plan: -Meropenem 1g IV every 8 hours -Vancomycin 1g IV x1 -Vancomycin 750mg  IV every 12 hours (AUC 433, Vd 0.72, IBW, sCr 1.4) -Monitor renal function -Follow up signs of clinical improvement, LOT, de-escalation of antibiotics   Height: 6' (182.9 cm) Weight: 90.8 kg (200 lb 2.8 oz) IBW/kg (Calculated) : 77.6  Temp (24hrs), Avg:100.6 F (38.1 C), Min:98.6 F (37 C), Max:102.5 F (39.2 C)  Recent Labs  Lab 09/25/23 2108 09/25/23 2136 09/25/23 2336  WBC 16.0*  --   --   CREATININE 1.40*  --   --   LATICACIDVEN  --  2.2* 1.3    Estimated Creatinine Clearance: 58.5 mL/min (A) (by C-G formula based on SCr of 1.4 mg/dL (H)).    Allergies  Allergen Reactions   Latex Other (See Comments)    Unknown reaction    Antimicrobials this admission: Meropenem 4/10 >>  Vancomycin 4/10 >>   Microbiology results: 4/9 BCx:   Thank you for allowing pharmacy to be a part of this patient's care.  Arabella Merles, PharmD. Clinical Pharmacist 09/26/2023 2:19 AM

## 2023-09-26 NOTE — Progress Notes (Signed)
 PROGRESS NOTE    Gregory [name]  ZOX:[id number] DOB: Feb 05, 1961 DOA: 09/25/2023 PCP: [name], Provider, MD   Brief Narrative: Gregory [name] is a 63 y.o. 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  Patient presented secondary to altered mental status and SIRS without specific source identified.  Patient started empirically on antibiotics culture data obtained and is pending.   Assessment/Plan:  SIRS Present on admission without clear infectious source. Associated altered mental status which is now resolved. C. Difficile negative. Imaging without evidence for pneumonia. Urinalysis not suggestive of a UTI. COVID-19, influenza and RSV negative. Possible cellulitis of left leg noted. -Check RVP -Follow-up GI pathogen panel results -Continue Vancomycin and meropenem -Follow-up culture data  Acute metabolic encephalopathy Unclear etiology. Possibly related to medication effect. Appears to have returned to baseline.  Chronic left lower extremity lymphedema Appears to be stable.  Diabetic ulcers No obvious evidence of infection. History of multiple amputations noted to left foot.  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  Depression Anxiety Agitation Patient is listed as taking Zyprexa, Lexapro, Trileptal and Depakote as an outpatient. -Resume pending medication reconciliation  CKD stage IIIa Baseline creatinine is between 1.1 to 1.3. Stable.  Diabetes mellitus type 2 Well controlled based on most recent hemoglobin A1C of 6.4%. Patient is managed on Lantus and insulin sliding scale as an outpatient, although med-rec is not yet complete.  Anemia of chronic disease Stable.  Primary hypertension Noted. Patient is not listed as taking antihypertensive medications as an  outpatient.  Chronic pain Noted to be at site of right leg stump. Patient's home medications were held secondary to somnolence on admission. -Resume oxycodone 15 mg; confirmed with patient  COPD Stable. -Continue DuoNeb as needed  History of right PCA embolic stroke Noted.  BPH History of recurrent urinary retention -Continue Flomax -Bladder scan and in/out catheterization as needed   DVT prophylaxis: Lovenox Code Status:   Code Status: Full Code Family Communication: None at bedside Disposition Plan: Discharge pending fever workup   Consultants:  None  Procedures:  None  Antimicrobials: Vancomycin Meropenem    Subjective: Right stump pain. Thirsty. Patient states right hand swelling is not normal for him. He reports some sneezing. No other concerns.  Objective: BP 138/62   Pulse 77   Temp (S) 100.1 F (37.8 C) (Oral)   Resp 18   Ht 6' (1.829 m)   Wt 90.8 kg   SpO2 92%   BMI 27.15 kg/m   Examination:  General exam: Appears calm and comfortable Respiratory system: Clear to auscultation. Respiratory effort normal. Cardiovascular system: S1 & S2 heard, RRR. 2/6 systolic murmur Gastrointestinal system: Abdomen is nondistended, soft and nontender. Normal bowel sounds heard. Ostomy bag with soft green stool noted. Central nervous system: Alert and oriented. No focal neurological deficits. Musculoskeletal: Left leg with significant edema. Right AKA. Psychiatry: Judgement and insight appear normal. Mood & affect appropriate.    Data Reviewed: I have personally reviewed following labs and imaging studies   Last CBC Lab Results  Component Value Date   WBC 10.7 (H) 09/26/2023   HGB 10.1 (L) 09/26/2023   HCT 30.9 (L) 09/26/2023   MCV 92.5 09/26/2023   MCH 30.2 09/26/2023   RDW 15.1 09/26/2023   PLT 202 09/26/2023     Last metabolic panel Lab  Results  Component Value Date   GLUCOSE 128 (H) 09/26/2023   NA 138 09/26/2023   K 4.5 09/26/2023   CL 106  09/26/2023   CO2 24 09/26/2023   BUN 16 09/26/2023   CREATININE 1.32 (H) 09/26/2023   GFRNONAA >60 09/26/2023   CALCIUM 9.0 09/26/2023   PHOS 4.6 08/15/2023   PROT 7.0 09/25/2023   ALBUMIN 3.2 (L) 09/25/2023   BILITOT 0.6 09/25/2023   ALKPHOS 76 09/25/2023   AST 15 09/25/2023   ALT 10 09/25/2023   ANIONGAP 8 09/26/2023     Creatinine Clearance: Estimated Creatinine Clearance: 62.1 mL/min (A) (by C-G formula based on SCr of 1.32 mg/dL (H)).  Recent Results (from the past 240 hours)  Blood Culture (routine x 2)     Status: None (Preliminary result)   Collection Time: 09/25/23  8:55 PM   Specimen: BLOOD LEFT HAND  Result Value Ref Range Status   Specimen Description BLOOD LEFT HAND  Final   Special Requests   Final    BOTTLES DRAWN AEROBIC AND ANAEROBIC Blood Culture adequate volume   Culture   Final    NO GROWTH < 12 HOURS Performed at [facility], [address] [place], [place] [address]    Report Status PENDING  Incomplete  Resp panel by RT-PCR (RSV, Flu A&B, Covid)     Status: None   Collection Time: 09/25/23  9:08 PM   Specimen: Nasal Swab  Result Value Ref Range Status   SARS Coronavirus 2 by RT PCR NEGATIVE NEGATIVE Final   Influenza A by PCR NEGATIVE NEGATIVE Final   Influenza B by PCR NEGATIVE NEGATIVE Final    Comment: (NOTE) The Xpert Xpress SARS-CoV-2/FLU/RSV plus assay is intended as an aid in the diagnosis of influenza from Nasopharyngeal swab specimens and should not be used as a sole basis for treatment. Nasal washings and aspirates are unacceptable for Xpert Xpress SARS-CoV-2/FLU/RSV testing.  Fact Sheet for Patients: BloggerCourse.com  Fact Sheet for Healthcare Providers: SeriousBroker.it  This test is not yet approved or cleared by the [place] FDA and has been authorized for detection and/or diagnosis of SARS-CoV-2 by FDA under an Emergency Use Authorization (EUA). This EUA will  remain in effect (meaning this test can be used) for the duration of the COVID-19 declaration under Section 564(b)(1) of the Act, 21 U.S.C. section 360bbb-3(b)(1), unless the authorization is terminated or revoked.     Resp Syncytial Virus by PCR NEGATIVE NEGATIVE Final    Comment: (NOTE) Fact Sheet for Patients: BloggerCourse.com  Fact Sheet for Healthcare Providers: SeriousBroker.it  This test is not yet approved or cleared by the [place] FDA and has been authorized for detection and/or diagnosis of SARS-CoV-2 by FDA under an Emergency Use Authorization (EUA). This EUA will remain in effect (meaning this test can be used) for the duration of the COVID-19 declaration under Section 564(b)(1) of the Act, 21 U.S.C. section 360bbb-3(b)(1), unless the authorization is terminated or revoked.  Performed at [facility], [address] [place], [place] [address]   Blood Culture (routine x 2)     Status: None (Preliminary result)   Collection Time: 09/25/23  9:50 PM   Specimen: BLOOD  Result Value Ref Range Status   Specimen Description BLOOD RIGHT ANTECUBITAL  Final   Special Requests   Final    BOTTLES DRAWN AEROBIC AND ANAEROBIC Blood Culture adequate volume   Culture   Final    NO GROWTH < 12 HOURS Performed  at [facility], [address], [place], [place] [address]    Report Status PENDING  Incomplete  C Difficile Quick Screen w PCR reflex     Status: None   Collection Time: 09/26/23  1:38 AM   Specimen: STOOL  Result Value Ref Range Status   C Diff antigen NEGATIVE NEGATIVE Final   C Diff toxin NEGATIVE NEGATIVE Final   C Diff interpretation No C. difficile detected.  Final    Comment: Performed at [facility], [address], [place], [place] [address]      Radiology Studies: DG Hand Complete Right Result Date: 09/26/2023 CLINICAL DATA:  pain and swelling EXAM: RIGHT HAND - COMPLETE 3+ VIEW  COMPARISON:  None Available. FINDINGS: There is no evidence of fracture or dislocation. There is no evidence of severe arthropathy or other focal bone abnormality. Dorsal hand subcutaneus soft tissue edema. Vascular calcification. No retained radiopaque foreign body. IMPRESSION: No acute displaced fracture or dislocation. Electronically Signed   By: Tish Frederickson M.D.   On: 09/26/2023 00:17   DG Chest Port 1 View Result Date: 09/26/2023 CLINICAL DATA:  Questionable sepsis - evaluate for abnormality EXAM: PORTABLE CHEST 1 VIEW COMPARISON:  Chest x-ray 08/14/2023 FINDINGS: The heart and mediastinal contours are unchanged. Atherosclerotic plaque. No focal consolidation. Chronic coarsened interstitial markings with no overt pulmonary edema. No pleural effusion. No pneumothorax. No acute osseous abnormality. IMPRESSION: 1. No acute cardiopulmonary disease with underlying chronic interstitial markings. 2.  Aortic Atherosclerosis (ICD10-I70.0). Electronically Signed   By: Tish Frederickson M.D.   On: 09/26/2023 00:16      LOS: 0 days    Jacquelin Hawking, MD [facility] 09/26/2023, 7:24 AM   If 7PM-7AM, please contact night-coverage [email or web address]

## 2023-09-27 DIAGNOSIS — R6510 Systemic inflammatory response syndrome (SIRS) of non-infectious origin without acute organ dysfunction: Secondary | ICD-10-CM | POA: Diagnosis not present

## 2023-09-27 LAB — GLUCOSE, CAPILLARY
Glucose-Capillary: 100 mg/dL — ABNORMAL HIGH (ref 70–99)
Glucose-Capillary: 106 mg/dL — ABNORMAL HIGH (ref 70–99)
Glucose-Capillary: 120 mg/dL — ABNORMAL HIGH (ref 70–99)
Glucose-Capillary: 123 mg/dL — ABNORMAL HIGH (ref 70–99)
Glucose-Capillary: 130 mg/dL — ABNORMAL HIGH (ref 70–99)
Glucose-Capillary: 130 mg/dL — ABNORMAL HIGH (ref 70–99)

## 2023-09-27 MED ORDER — PREGABALIN 25 MG PO CAPS
150.0000 mg | ORAL_CAPSULE | Freq: Two times a day (BID) | ORAL | Status: DC
  Administered 2023-09-27 – 2023-09-28 (×3): 150 mg via ORAL
  Filled 2023-09-27 (×3): qty 2

## 2023-09-27 NOTE — Progress Notes (Signed)
 PROGRESS NOTE    Gregory [name]  ZOX:[id number] DOB: 1960-10-16 DOA: 09/25/2023 PCP: [name], Provider, MD   Brief Narrative: Gregory [name] is a 63 y.o. 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  Patient presented secondary to altered mental status and SIRS without specific source identified.  Patient started empirically on antibiotics; culture data obtained and is no growth to date.   Assessment/Plan:  SIRS Present on admission without clear infectious source. Associated altered mental status which is now resolved. C. Difficile negative. Imaging without evidence for pneumonia. Urinalysis not suggestive of a UTI. COVID-19, influenza and RSV negative. Possible cellulitis of left leg noted. Workup so far is negative. -Continue Vancomycin and meropenem and transition to oral regimen in AM -Follow-up culture data  Acute metabolic encephalopathy Unclear etiology. Possibly related to medication effect. Appears to have returned to baseline.  Chronic left lower extremity lymphedema Appears to be stable.  Diabetic ulcers No obvious evidence of infection. History of multiple amputations noted to left foot.  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  Depression Anxiety Agitation Patient is listed as taking Zyprexa, Lexapro, Trileptal and Depakote as an outpatient. -Resume pending medication reconciliation  CKD stage IIIa Baseline creatinine is between 1.1 to 1.3. Stable.  Diabetes mellitus type 2 Well controlled based on most recent hemoglobin A1C of 6.4%. Patient is managed on Lantus and insulin sliding scale as an outpatient, although med-rec is not yet complete.  Anemia of chronic disease Stable.  Primary hypertension Noted. Patient is not listed as taking  antihypertensive medications as an outpatient.  Chronic pain Noted to be at site of right leg stump. Patient's home medications were held secondary to somnolence on admission. -Continue home oxycodone and Lyrica  COPD Stable. -Continue DuoNeb as needed  History of right PCA embolic stroke Noted.  BPH History of recurrent urinary retention -Continue Flomax -Bladder scan and in/out catheterization as needed   DVT prophylaxis: Lovenox Code Status:   Code Status: Full Code Family Communication: None at bedside Disposition Plan: Discharge back to facility tomorrow pending bed availability   Consultants:  None  Procedures:  None  Antimicrobials: Vancomycin Meropenem    Subjective: Some stump pain. No other concerns or symptoms. Feels well.  Objective: BP (!) 152/59 (BP Location: Left Arm)   Pulse (!) 53   Temp 98.4 F (36.9 C) (Oral)   Resp 12   Ht 6' (1.829 m)   Wt 90.8 kg   SpO2 94%   BMI 27.15 kg/m   Examination:  General exam: Appears calm and comfortable Respiratory system: Clear to auscultation. Respiratory effort normal. Cardiovascular system: S1 & S2 heard, RRR. Gastrointestinal system: Abdomen is nondistended, soft and nontender. Normal bowel sounds heard. Central nervous system: Alert and oriented. No focal neurological deficits. Psychiatry: Judgement and insight appear normal. Mood & affect appropriate.    Data Reviewed: I have personally reviewed following labs and imaging studies   Last CBC Lab Results  Component Value Date   WBC 10.7 (H) 09/26/2023   HGB 10.1 (L) 09/26/2023   HCT 30.9 (L) 09/26/2023   MCV 92.5 09/26/2023   MCH 30.2 09/26/2023   RDW 15.1 09/26/2023   PLT 202 09/26/2023     Last metabolic panel Lab Results  Component Value Date   GLUCOSE 128 (H) 09/26/2023   NA  138 09/26/2023   K 4.5 09/26/2023   CL 106 09/26/2023   CO2 24 09/26/2023   BUN 16 09/26/2023   CREATININE 1.32 (H) 09/26/2023   GFRNONAA >60  09/26/2023   CALCIUM 9.0 09/26/2023   PHOS 4.6 08/15/2023   PROT 7.0 09/25/2023   ALBUMIN 3.2 (L) 09/25/2023   BILITOT 0.6 09/25/2023   ALKPHOS 76 09/25/2023   AST 15 09/25/2023   ALT 10 09/25/2023   ANIONGAP 8 09/26/2023     Creatinine Clearance: Estimated Creatinine Clearance: 62.1 mL/min (A) (by C-G formula based on SCr of 1.32 mg/dL (H)).  Recent Results (from the past 240 hours)  Blood Culture (routine x 2)     Status: None (Preliminary result)   Collection Time: 09/25/23  8:55 PM   Specimen: BLOOD LEFT HAND  Result Value Ref Range Status   Specimen Description BLOOD LEFT HAND  Final   Special Requests   Final    BOTTLES DRAWN AEROBIC AND ANAEROBIC Blood Culture adequate volume   Culture   Final    NO GROWTH 2 DAYS Performed at [facility], [address], [place], [place] [address]    Report Status PENDING  Incomplete  Resp panel by RT-PCR (RSV, Flu A&B, Covid)     Status: None   Collection Time: 09/25/23  9:08 PM   Specimen: Nasal Swab  Result Value Ref Range Status   SARS Coronavirus 2 by RT PCR NEGATIVE NEGATIVE Final   Influenza A by PCR NEGATIVE NEGATIVE Final   Influenza B by PCR NEGATIVE NEGATIVE Final    Comment: (NOTE) The Xpert Xpress SARS-CoV-2/FLU/RSV plus assay is intended as an aid in the diagnosis of influenza from Nasopharyngeal swab specimens and should not be used as a sole basis for treatment. Nasal washings and aspirates are unacceptable for Xpert Xpress SARS-CoV-2/FLU/RSV testing.  Fact Sheet for Patients: BloggerCourse.com  Fact Sheet for Healthcare Providers: SeriousBroker.it  This test is not yet approved or cleared by the [place] FDA and has been authorized for detection and/or diagnosis of SARS-CoV-2 by FDA under an Emergency Use Authorization (EUA). This EUA will remain in effect (meaning this test can be used) for the duration of the COVID-19 declaration under Section  564(b)(1) of the Act, 21 U.S.C. section 360bbb-3(b)(1), unless the authorization is terminated or revoked.     Resp Syncytial Virus by PCR NEGATIVE NEGATIVE Final    Comment: (NOTE) Fact Sheet for Patients: BloggerCourse.com  Fact Sheet for Healthcare Providers: SeriousBroker.it  This test is not yet approved or cleared by the [place] FDA and has been authorized for detection and/or diagnosis of SARS-CoV-2 by FDA under an Emergency Use Authorization (EUA). This EUA will remain in effect (meaning this test can be used) for the duration of the COVID-19 declaration under Section 564(b)(1) of the Act, 21 U.S.C. section 360bbb-3(b)(1), unless the authorization is terminated or revoked.  Performed at [facility], [address] [place], [place] [address]   Blood Culture (routine x 2)     Status: None (Preliminary result)   Collection Time: 09/25/23  9:50 PM   Specimen: BLOOD  Result Value Ref Range Status   Specimen Description BLOOD RIGHT ANTECUBITAL  Final   Special Requests   Final    BOTTLES DRAWN AEROBIC AND ANAEROBIC Blood Culture adequate volume   Culture   Final    NO GROWTH 2 DAYS Performed at [facility], [address] [place], [place] [address]    Report  Status PENDING  Incomplete  C Difficile Quick Screen w PCR reflex     Status: None   Collection Time: 09/26/23  1:38 AM   Specimen: STOOL  Result Value Ref Range Status   C Diff antigen NEGATIVE NEGATIVE Final   C Diff toxin NEGATIVE NEGATIVE Final   C Diff interpretation No C. difficile detected.  Final    Comment: Performed at [facility], [address], [place], [place] [address]  Gastrointestinal Panel by PCR , Stool     Status: None   Collection Time: 09/26/23  1:39 AM   Specimen: Stool  Result Value Ref Range Status   Campylobacter species NOT DETECTED NOT DETECTED Final   Plesimonas shigelloides NOT DETECTED NOT DETECTED Final    Salmonella species NOT DETECTED NOT DETECTED Final   Yersinia enterocolitica NOT DETECTED NOT DETECTED Final   Vibrio species NOT DETECTED NOT DETECTED Final   Vibrio cholerae NOT DETECTED NOT DETECTED Final   Enteroaggregative E coli (EAEC) NOT DETECTED NOT DETECTED Final   Enteropathogenic E coli (EPEC) NOT DETECTED NOT DETECTED Final   Enterotoxigenic E coli (ETEC) NOT DETECTED NOT DETECTED Final   Shiga like toxin producing E coli (STEC) NOT DETECTED NOT DETECTED Final   Shigella/Enteroinvasive E coli (EIEC) NOT DETECTED NOT DETECTED Final   Cryptosporidium NOT DETECTED NOT DETECTED Final   Cyclospora cayetanensis NOT DETECTED NOT DETECTED Final   Entamoeba histolytica NOT DETECTED NOT DETECTED Final   Giardia lamblia NOT DETECTED NOT DETECTED Final   Adenovirus F40/41 NOT DETECTED NOT DETECTED Final   Astrovirus NOT DETECTED NOT DETECTED Final   Norovirus GI/GII NOT DETECTED NOT DETECTED Final   Rotavirus A NOT DETECTED NOT DETECTED Final   Sapovirus (I, II, IV, and V) NOT DETECTED NOT DETECTED Final    Comment: Performed at [facility], [address] [place], [place] [address]  Respiratory (~20 pathogens) panel by PCR     Status: None   Collection Time: 09/26/23 12:32 PM   Specimen: Nasopharyngeal Swab; Respiratory  Result Value Ref Range Status   Adenovirus NOT DETECTED NOT DETECTED Final   Coronavirus 229E NOT DETECTED NOT DETECTED Final    Comment: (NOTE) The Coronavirus on the Respiratory Panel, DOES NOT test for the novel  Coronavirus (2019 nCoV)    Coronavirus HKU1 NOT DETECTED NOT DETECTED Final   Coronavirus NL63 NOT DETECTED NOT DETECTED Final   Coronavirus OC43 NOT DETECTED NOT DETECTED Final   Metapneumovirus NOT DETECTED NOT DETECTED Final   Rhinovirus / Enterovirus NOT DETECTED NOT DETECTED Final   Influenza A NOT DETECTED NOT DETECTED Final   Influenza B NOT DETECTED NOT DETECTED Final   Parainfluenza Virus 1 NOT DETECTED NOT DETECTED Final    Parainfluenza Virus 2 NOT DETECTED NOT DETECTED Final   Parainfluenza Virus 3 NOT DETECTED NOT DETECTED Final   Parainfluenza Virus 4 NOT DETECTED NOT DETECTED Final   Respiratory Syncytial Virus NOT DETECTED NOT DETECTED Final   Bordetella pertussis NOT DETECTED NOT DETECTED Final   Bordetella Parapertussis NOT DETECTED NOT DETECTED Final   Chlamydophila pneumoniae NOT DETECTED NOT DETECTED Final   Mycoplasma pneumoniae NOT DETECTED NOT DETECTED Final    Comment: Performed at [facility], [address], [place], [place] [address]      Radiology Studies: DG Hand Complete Right Result Date: 09/26/2023 CLINICAL DATA:  pain and swelling EXAM: RIGHT HAND - COMPLETE 3+ VIEW COMPARISON:  None Available. FINDINGS: There is no evidence of fracture or  dislocation. There is no evidence of severe arthropathy or other focal bone abnormality. Dorsal hand subcutaneus soft tissue edema. Vascular calcification. No retained radiopaque foreign body. IMPRESSION: No acute displaced fracture or dislocation. Electronically Signed   By: Tish Frederickson M.D.   On: 09/26/2023 00:17   DG Chest Port 1 View Result Date: 09/26/2023 CLINICAL DATA:  Questionable sepsis - evaluate for abnormality EXAM: PORTABLE CHEST 1 VIEW COMPARISON:  Chest x-ray 08/14/2023 FINDINGS: The heart and mediastinal contours are unchanged. Atherosclerotic plaque. No focal consolidation. Chronic coarsened interstitial markings with no overt pulmonary edema. No pleural effusion. No pneumothorax. No acute osseous abnormality. IMPRESSION: 1. No acute cardiopulmonary disease with underlying chronic interstitial markings. 2.  Aortic Atherosclerosis (ICD10-I70.0). Electronically Signed   By: Tish Frederickson M.D.   On: 09/26/2023 00:16      LOS: 1 day    Jacquelin Hawking, MD [facility] 09/27/2023, 12:53 PM   If 7PM-7AM, please contact night-coverage [email or web address]

## 2023-09-27 NOTE — TOC Progression Note (Signed)
 Transition of Care [facility]) - Progression Note    Patient Details  Name: Gregory [name] MRN: [id number] Date of Birth: 1961/03/28  Transition of Care [facility]) CM/SW Contact  [name], [place] Phone Number: 09/27/2023, 2:18 PM  Clinical Narrative:     [name] confirmed they can admit patient tomorrow.  Antony Blackbird, MSW, LCSW Clinical Social Worker         Expected Discharge Plan and Services                                               Social Determinants of Health (SDOH) Interventions SDOH Screenings   Food Insecurity: No Food Insecurity (09/26/2023)  Housing: Low Risk  (09/26/2023)  Transportation Needs: No Transportation Needs (09/26/2023)  Utilities: Not At Risk (09/26/2023)  Depression (PHQ2-9): Low Risk  (03/08/2021)  Social Connections: Socially Isolated (09/26/2023)  Tobacco Use: High Risk (09/25/2023)    Readmission Risk Interventions    09/04/2023    1:49 PM 09/06/2022    1:48 PM 08/27/2022   12:46 PM  Readmission Risk Prevention Plan  Transportation Screening Complete Complete Complete  Medication Review Oceanographer) Complete Complete Complete  PCP or Specialist appointment within 3-5 days of discharge Complete Complete Complete  HRI or Home Care Consult Complete Complete Complete  SW Recovery Care/Counseling Consult Complete Complete Complete  Palliative Care Screening Not Applicable Not Applicable Not Applicable  Skilled Nursing Facility Complete Complete Complete

## 2023-09-27 NOTE — TOC Progression Note (Signed)
 Transition of Care [facility]) - Progression Note    Patient Details  Name: Gregory [name] MRN: [id number] Date of Birth: [birth year]  Transition of Care [facility]) CM/SW Contact  [name], [place] Phone Number: 09/27/2023, 10:46 AM  Clinical Narrative:     Patient is from [facility], per SNF no bed available today, maybe over the weekend.  TOC will continue to follow and assist with discharge planning.  Antony Blackbird, MSW, LCSW Clinical Social Worker          Expected Discharge Plan and Services                                               Social Determinants of Health (SDOH) Interventions SDOH Screenings   Food Insecurity: No Food Insecurity (09/26/2023)  Housing: Low Risk  (09/26/2023)  Transportation Needs: No Transportation Needs (09/26/2023)  Utilities: Not At Risk (09/26/2023)  Depression (PHQ2-9): Low Risk  (03/08/2021)  Social Connections: Socially Isolated (09/26/2023)  Tobacco Use: High Risk (09/25/2023)    Readmission Risk Interventions    09/04/2023    1:49 PM 09/06/2022    1:48 PM 08/27/2022   12:46 PM  Readmission Risk Prevention Plan  Transportation Screening Complete Complete Complete  Medication Review Oceanographer) Complete Complete Complete  PCP or Specialist appointment within 3-5 days of discharge Complete Complete Complete  HRI or Home Care Consult Complete Complete Complete  SW Recovery Care/Counseling Consult Complete Complete Complete  Palliative Care Screening Not Applicable Not Applicable Not Applicable  Skilled Nursing Facility Complete Complete Complete

## 2023-09-28 DIAGNOSIS — R6510 Systemic inflammatory response syndrome (SIRS) of non-infectious origin without acute organ dysfunction: Secondary | ICD-10-CM | POA: Diagnosis not present

## 2023-09-28 LAB — GLUCOSE, CAPILLARY
Glucose-Capillary: 130 mg/dL — ABNORMAL HIGH (ref 70–99)
Glucose-Capillary: 85 mg/dL (ref 70–99)
Glucose-Capillary: 103 mg/dL — ABNORMAL HIGH (ref 70–99)
Glucose-Capillary: 142 mg/dL — ABNORMAL HIGH (ref 70–99)

## 2023-09-28 MED ORDER — OXYCODONE HCL 15 MG PO TABS
15.0000 mg | ORAL_TABLET | Freq: Three times a day (TID) | ORAL | 0 refills | Status: AC
Start: 2023-09-28 — End: ?

## 2023-09-28 MED ORDER — DOXYCYCLINE HYCLATE 100 MG PO TABS: 100.0000 mg | ORAL_TABLET | Freq: Two times a day (BID) | ORAL | Status: AC

## 2023-09-28 MED ORDER — PREGABALIN 150 MG PO CAPS: 150.0000 mg | ORAL_CAPSULE | Freq: Two times a day (BID) | ORAL | 0 refills | Status: DC

## 2023-09-28 MED ORDER — AMOXICILLIN-POT CLAVULANATE 875-125 MG PO TABS
1.0000 | ORAL_TABLET | Freq: Two times a day (BID) | ORAL | Status: AC
Start: 2023-09-28 — End: 2023-10-02

## 2023-09-28 MED ORDER — OXYCODONE HCL 5 MG PO TABS: 5.0000 mg | ORAL_TABLET | Freq: Four times a day (QID) | ORAL | 0 refills | Status: DC | PRN

## 2023-09-28 NOTE — Discharge Instructions (Signed)
 Gregory [name],  You were in the hospital with confusion and a possible infection. You have improved significantly. I'm unsure of what this infection was, but I will continue antibiotics on discharge. Please follow-up with your PCP.

## 2023-09-28 NOTE — TOC Transition Note (Addendum)
 Transition of Care [facility]) - Discharge Note   Patient Details  Name: Gregory [name] MRN: [id number] Date of Birth: July 07, 1960  Transition of Care [facility]) CM/SW Contact:  Jarrell Merritts, LCSW Phone Number: 09/28/2023, 12:22 PM   Clinical Narrative:     Patient will DC to:?[facility] Anticipated DC date:?09/28/2023 Family notified:?Daughter-[name] Transport by: Daughter -Alston Jerry   Per MD patient ready for DC to [facility]. RN, patient, patient's family, and facility notified of DC. Discharge Summary sent to facility. RN given number for report [phone].. DC packet on chart. Daughter Alston Jerry will transport pt to facility.   CSW signing off.   Georgine Kitchens, [place] [phone]         Patient Goals and CMS Choice            Discharge Placement                       Discharge Plan and Services Additional resources added to the After Visit Summary for                                       Social Drivers of Health (SDOH) Interventions SDOH Screenings   Food Insecurity: No Food Insecurity (09/26/2023)  Housing: Low Risk  (09/26/2023)  Transportation Needs: No Transportation Needs (09/26/2023)  Utilities: Not At Risk (09/26/2023)  Depression (PHQ2-9): Low Risk  (03/08/2021)  Social Connections: Socially Isolated (09/26/2023)  Tobacco Use: High Risk (09/25/2023)     Readmission Risk Interventions    09/04/2023    1:49 PM 09/06/2022    1:48 PM 08/27/2022   12:46 PM  Readmission Risk Prevention Plan  Transportation Screening Complete Complete Complete  Medication Review Oceanographer) Complete Complete Complete  PCP or Specialist appointment within 3-5 days of discharge Complete Complete Complete  HRI or Home Care Consult Complete Complete Complete  SW Recovery Care/Counseling Consult Complete Complete Complete  Palliative Care Screening Not Applicable Not Applicable Not Applicable  Skilled Nursing Facility Complete Complete Complete

## 2023-09-28 NOTE — Plan of Care (Signed)
  Problem: Elimination: Goal: Will not experience complications related to urinary retention Outcome: Progressing   Problem: Coping: Goal: Ability to adjust to condition or change in health will improve Outcome: Not Progressing   Problem: Health Behavior/Discharge Planning: Goal: Ability to manage health-related needs will improve Outcome: Not Progressing   Problem: Clinical Measurements: Goal: Signs and symptoms of infection will decrease Outcome: Not Progressing   Problem: Clinical Measurements: Goal: Will remain free from infection Outcome: Not Progressing   Problem: Activity: Goal: Risk for activity intolerance will decrease Outcome: Not Progressing   Problem: Pain Managment: Goal: General experience of comfort will improve and/or be controlled Outcome: Not Progressing

## 2023-09-28 NOTE — Discharge Summary (Signed)
 Physician Discharge Summary   Patient: Gregory [name] MRN: [id number] DOB: 02-Nov-1960  Admit date:     09/25/2023  Discharge date: 09/28/23  Discharge Physician: Aneita Keens, MD   PCP: [name], Provider, MD   Recommendations at discharge:  PCP visit for hospital follow-up  Discharge Diagnoses: Principal Problem:   SIRS (systemic inflammatory response syndrome) (HCC) Active Problems:   COPD (chronic obstructive pulmonary disease) (HCC)   HTN (hypertension)   Type II diabetes mellitus with neurological manifestations (HCC)   Mood disorder (HCC)   Pain of amputation stump of right lower extremity (HCC)   BPH (benign prostatic hyperplasia)   Anemia of chronic disease   Acute metabolic encephalopathy   Chronic kidney disease, stage 3a (HCC)   History of CVA (cerebrovascular accident)  Resolved Problems:   * No resolved hospital problems. *  Hospital Course: Gregory [name] is a 63 y.o. 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 Patient managed on Vancomycin and meropenem. No source identified. Patient discharged on doxycycline and Augmentin.  Assessment and Plan:    SIRS Present on admission without clear infectious source. Associated altered mental status which is now resolved. C. Difficile negative. Imaging without evidence for pneumonia. Urinalysis not suggestive of a UTI. COVID-19, influenza and RSV negative. Possible cellulitis of left leg noted. Workup so far is negative. Patient managed empirically on Vancomycin and meropenem and transition to doxycycline and Augmentin on discharge. Blood cultures with no growth.   Acute metabolic encephalopathy Unclear etiology.  Possibly related to medication effect. Appears to have returned to baseline.   Chronic left lower extremity lymphedema Appears to be stable.   Diabetic ulcers No obvious evidence of infection. History of multiple amputations noted to left foot.   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   Depression Anxiety Agitation Patient is listed as taking Lexapro and Trileptal as an outpatient. Resume on discharge.   CKD stage IIIa Baseline creatinine is between 1.1 to 1.3. Stable.   Diabetes mellitus type 2 Well controlled based on most recent hemoglobin A1C of 6.4%. Patient is managed on Lantus and insulin sliding scale as an outpatient, although med-rec is not yet complete.   Anemia of chronic disease Stable.   Primary hypertension Noted. Patient is not listed as taking antihypertensive medications as an outpatient.   Chronic pain Noted to be at site of right leg stump. Patient's home medications were held secondary to somnolence on admission. Continue home oxycodone and Lyrica  COPD Stable   History of right PCA embolic stroke Noted.   BPH History of recurrent urinary retention Continue Flomax   Consultants: None Procedures performed: None  Disposition: Long term care facility Diet recommendation: Cardiac and Carb modified diet   DISCHARGE MEDICATION: Allergies as of 09/28/2023       Reactions   Latex Other (See Comments)   Unknown reaction (MAR)        Medication List     STOP taking these medications    Cranberry 450 MG Tabs   divalproex 125 MG DR tablet Commonly known as: DEPAKOTE   famotidine 20 MG tablet Commonly known as: PEPCID   furosemide 20 MG tablet Commonly known as: Lasix   Lantus SoloStar 100 UNIT/ML Solostar  Pen Generic drug: insulin glargine   leptospermum manuka honey Pste paste   OLANZapine 15 MG tablet Commonly known as: ZYPREXA       TAKE these medications    acetaminophen 325 MG  tablet Commonly known as: TYLENOL Take 2 tablets (650 mg total) by mouth every 6 (six) hours as needed for mild pain (pain score 1-3) (or Fever >/= 101). What changed: when to take this   amoxicillin-clavulanate 875-125 MG tablet Commonly known as: AUGMENTIN Take 1 tablet by mouth 2 (two) times daily for 4 days.   aspirin EC 81 MG tablet Take 81 mg by mouth daily.   atorvastatin 80 MG tablet Commonly known as: LIPITOR Take 1 tablet (80 mg total) by mouth daily.   Cholecalciferol 25 MCG (1000 UT) capsule Take 2,000 Units by mouth daily.   docusate sodium 100 MG capsule Commonly known as: COLACE Take 100 mg by mouth every 12 (twelve) hours as needed (constipation).   doxycycline 100 MG tablet Commonly known as: VIBRA-TABS Take 1 tablet (100 mg total) by mouth 2 (two) times daily for 4 days.   escitalopram 20 MG tablet Commonly known as: LEXAPRO Take 1 tablet (20 mg total) by mouth daily.   ferrous sulfate 325 (65 FE) MG tablet Take 1 tablet (325 mg total) by mouth 3 (three) times daily with meals.   insulin lispro 100 UNIT/ML KwikPen Commonly known as: HUMALOG Inject 0-10 Units into the skin See admin instructions. Inject 0-10 units into the skin three times a day and at bedtime, PER SLIDING SCALE:  BGL 151-200 = 2 units 201-250 = 4 units 251-300 = 6 units 301-350 =  8 units 351-400 = 10 units What changed:  when to take this additional instructions   ipratropium-albuterol 0.5-2.5 (3) MG/3ML Soln Commonly known as: DUONEB Take 3 mLs by nebulization every 6 (six) hours as needed (shortness of breath, wheezing).   lactulose 10 GM/15ML solution Commonly known as: CHRONULAC Take 30 mLs (20 g total) by mouth 2 (two) times daily.   lidocaine 4 % Place 1 patch onto the skin daily. Apply to knee.   magnesium oxide 400 (240 Mg) MG tablet Commonly known as: MAG-OX Take 400 mg by mouth in the morning, at noon, and at bedtime.   Melatonin 10 MG Tabs Take 10 mg by mouth  at bedtime.   multivitamin with minerals Tabs tablet Take 1 tablet by mouth daily.   Narcan 4 MG/0.1ML Liqd nasal spray kit Generic drug: naloxone Place 1 spray into the nose See admin instructions. 1 spray alternating nostrils every 5 minutes as needed for opiate overdose.   Natural Balance Tears 0.1-0.3 % Soln Generic drug: Dextran 70-Hypromellose Place 1 drop into both eyes every 4 (four) hours as needed (dry eyes).   omeprazole 20 MG capsule Commonly known as: PRILOSEC Take 20 mg by mouth at bedtime.   ondansetron 4 MG tablet Commonly known as: ZOFRAN Take 4 mg by mouth every 6 (six) hours as needed for nausea or vomiting.   OXcarbazepine 150 MG tablet Commonly known as: TRILEPTAL Take 1 tablet (150 mg total) by mouth 2 (two) times daily.   oxyCODONE 5 MG immediate release tablet Commonly known as: Oxy IR/ROXICODONE Take 1 tablet (5 mg total) by mouth every 6 (six) hours as needed for severe pain (pain score 7-10).   oxyCODONE 15 MG immediate release tablet Commonly known as: ROXICODONE Take 1 tablet (15 mg total) by mouth in the morning, at noon, and at bedtime.  pregabalin 150 MG capsule Commonly known as: LYRICA Take 1 capsule (150 mg total) by mouth 2 (two) times daily.   prochlorperazine 5 MG tablet Commonly known as: COMPAZINE Take 5 mg by mouth every 8 (eight) hours as needed for nausea or vomiting.   senna 8.6 MG Tabs tablet Commonly known as: SENOKOT Take 2 tablets by mouth at bedtime.   sodium bicarbonate 650 MG tablet Take 650 mg by mouth 2 (two) times daily.   tamsulosin 0.4 MG Caps capsule Commonly known as: FLOMAX Take 1 capsule (0.4 mg total) by mouth daily.   UNABLE TO FIND Apply 1 application  topically daily. Med Name: Dori Garbe  Apply to buttocks for 14 days (08/21/23-09/04/23)   Uro-MP 118 MG Caps Take 118 mg by mouth 2 (two) times daily.        Discharge Exam: BP (!) 155/77 (BP Location: Left Arm)   Pulse 69   Temp 98.7 F  (37.1 C) (Oral)   Resp 18   Ht 6' (1.829 m)   Wt 90.8 kg   SpO2 94%   BMI 27.15 kg/m   General exam: Appears calm and comfortable Respiratory system:  Respiratory effort normal. Central nervous system: Alert and oriented.  Psychiatry: Judgement and insight appear normal. Mood & affect appropriate.   Condition at discharge: stable  The results of significant diagnostics from this hospitalization (including imaging, microbiology, ancillary and laboratory) are listed below for reference.   Imaging Studies: DG Hand Complete Right Result Date: 09/26/2023 CLINICAL DATA:  pain and swelling EXAM: RIGHT HAND - COMPLETE 3+ VIEW COMPARISON:  None Available. FINDINGS: There is no evidence of fracture or dislocation. There is no evidence of severe arthropathy or other focal bone abnormality. Dorsal hand subcutaneus soft tissue edema. Vascular calcification. No retained radiopaque foreign body. IMPRESSION: No acute displaced fracture or dislocation. Electronically Signed   By: Morgane  Naveau M.D.   On: 09/26/2023 00:17   DG Chest Port 1 View Result Date: 09/26/2023 CLINICAL DATA:  Questionable sepsis - evaluate for abnormality EXAM: PORTABLE CHEST 1 VIEW COMPARISON:  Chest x-ray 08/14/2023 FINDINGS: The heart and mediastinal contours are unchanged. Atherosclerotic plaque. No focal consolidation. Chronic coarsened interstitial markings with no overt pulmonary edema. No pleural effusion. No pneumothorax. No acute osseous abnormality. IMPRESSION: 1. No acute cardiopulmonary disease with underlying chronic interstitial markings. 2.  Aortic Atherosclerosis (ICD10-I70.0). Electronically Signed   By: Morgane  Naveau M.D.   On: 09/26/2023 00:16   CT HEAD WO CONTRAST Result Date: 09/02/2023 CLINICAL DATA:  Mental status change EXAM: CT HEAD WITHOUT CONTRAST TECHNIQUE: Contiguous axial images were obtained from the base of the skull through the vertex without intravenous contrast. RADIATION DOSE REDUCTION: This  exam was performed according to the departmental dose-optimization program which includes automated exposure control, adjustment of the mA and/or kV according to patient size and/or use of iterative reconstruction technique. COMPARISON:  MRI head 06/27/2023, CT head 07/15/2023 FINDINGS: Brain: No acute intracranial hemorrhage. No CT evidence of acute infarct. Nonspecific hypoattenuation in the periventricular and subcortical white matter favored to reflect chronic microvascular ischemic changes. Redemonstrated remote infarct in the right PCA territory no edema, mass effect, or midline shift. The basilar cisterns are patent. Ventricles: Prominence of the ventricles suggesting underlying parenchymal volume loss. Vascular: Atherosclerotic calcifications of the carotid siphons and intracranial vertebral arteries. No hyperdense vessel. Skull: No acute or aggressive finding. Chronic appearing bilateral nasal bone deformities. Orbits: Orbits are symmetric. Sinuses: Mucosal thickening in the ethmoid sinuses and left maxillary sinus.  Other: Small left mastoid effusion. Similar focal soft tissue swelling and surgical clips in the right facial soft tissues. IMPRESSION: No CT evidence of acute intracranial abnormality. Remote right PCA territory infarct. Mild chronic microvascular ischemic changes. Electronically Signed   By: Denny Flack M.D.   On: 09/02/2023 13:55    Microbiology: Results for orders placed or performed during the hospital encounter of 09/25/23  Blood Culture (routine x 2)     Status: None (Preliminary result)   Collection Time: 09/25/23  8:55 PM   Specimen: BLOOD LEFT HAND  Result Value Ref Range Status   Specimen Description BLOOD LEFT HAND  Final   Special Requests   Final    BOTTLES DRAWN AEROBIC AND ANAEROBIC Blood Culture adequate volume   Culture   Final    NO GROWTH 3 DAYS Performed at [facility], [address] [place], [place] [address]    Report Status PENDING  Incomplete   Resp panel by RT-PCR (RSV, Flu A&B, Covid)     Status: None   Collection Time: 09/25/23  9:08 PM   Specimen: Nasal Swab  Result Value Ref Range Status   SARS Coronavirus 2 by RT PCR NEGATIVE NEGATIVE Final   Influenza A by PCR NEGATIVE NEGATIVE Final   Influenza B by PCR NEGATIVE NEGATIVE Final    Comment: (NOTE) The Xpert Xpress SARS-CoV-2/FLU/RSV plus assay is intended as an aid in the diagnosis of influenza from Nasopharyngeal swab specimens and should not be used as a sole basis for treatment. Nasal washings and aspirates are unacceptable for Xpert Xpress SARS-CoV-2/FLU/RSV testing.  Fact Sheet for Patients: BloggerCourse.com  Fact Sheet for Healthcare Providers: SeriousBroker.it  This test is not yet approved or cleared by the [place]  FDA and has been authorized for detection and/or diagnosis of SARS-CoV-2 by FDA under an Emergency Use Authorization (EUA). This EUA will remain in effect (meaning this test can be used) for the duration of the COVID-19 declaration under Section 564(b)(1) of the Act, 21 U.S.C. section 360bbb-3(b)(1), unless the authorization is terminated or revoked.     Resp Syncytial Virus by PCR NEGATIVE NEGATIVE Final    Comment: (NOTE) Fact Sheet for Patients: BloggerCourse.com  Fact Sheet for Healthcare Providers: SeriousBroker.it  This test is not yet approved or cleared by the [place]  FDA and has been authorized for detection and/or diagnosis of SARS-CoV-2 by FDA under an Emergency Use Authorization (EUA). This EUA will remain in effect (meaning this test can be used) for the duration of the COVID-19 declaration under Section 564(b)(1) of the Act, 21 U.S.C. section 360bbb-3(b)(1), unless the authorization is terminated or revoked.  Performed at [facility], [address], [place], [place] [address]   Blood Culture (routine x  2)     Status: None (Preliminary result)   Collection Time: 09/25/23  9:50 PM   Specimen: BLOOD  Result Value Ref Range Status   Specimen Description BLOOD RIGHT ANTECUBITAL  Final   Special Requests   Final    BOTTLES DRAWN AEROBIC AND ANAEROBIC Blood Culture adequate volume   Culture   Final    NO GROWTH 3 DAYS Performed at [facility], [address], [place], [place] [address]    Report Status PENDING  Incomplete  C Difficile Quick Screen w PCR reflex     Status: None   Collection Time: 09/26/23  1:38 AM   Specimen: STOOL  Result Value Ref Range Status   C Diff antigen NEGATIVE  NEGATIVE Final   C Diff toxin NEGATIVE NEGATIVE Final   C Diff interpretation No C. difficile detected.  Final    Comment: Performed at [facility], [address], [place], [place] [address]  Gastrointestinal Panel by PCR , Stool     Status: None   Collection Time: 09/26/23  1:39 AM   Specimen: Stool  Result Value Ref Range Status   Campylobacter species NOT DETECTED NOT DETECTED Final   Plesimonas shigelloides NOT DETECTED NOT DETECTED Final   Salmonella species NOT DETECTED NOT DETECTED Final   Yersinia enterocolitica NOT DETECTED NOT DETECTED Final   Vibrio species NOT DETECTED NOT DETECTED Final   Vibrio cholerae NOT DETECTED NOT DETECTED Final   Enteroaggregative E coli (EAEC) NOT DETECTED NOT DETECTED Final   Enteropathogenic E coli (EPEC) NOT DETECTED NOT DETECTED Final   Enterotoxigenic E coli (ETEC) NOT DETECTED NOT DETECTED Final   Shiga like toxin producing E coli (STEC) NOT DETECTED NOT DETECTED Final   Shigella/Enteroinvasive E coli (EIEC) NOT DETECTED NOT DETECTED Final   Cryptosporidium NOT DETECTED NOT DETECTED Final   Cyclospora cayetanensis NOT DETECTED NOT DETECTED Final   Entamoeba histolytica NOT DETECTED NOT DETECTED Final   Giardia lamblia NOT DETECTED NOT DETECTED Final   Adenovirus F40/41 NOT DETECTED NOT DETECTED Final   Astrovirus NOT DETECTED NOT DETECTED  Final   Norovirus GI/GII NOT DETECTED NOT DETECTED Final   Rotavirus A NOT DETECTED NOT DETECTED Final   Sapovirus (I, II, IV, and V) NOT DETECTED NOT DETECTED Final    Comment: Performed at [facility], [address] [place], [place] [address]  Respiratory (~20 pathogens) panel by PCR     Status: None   Collection Time: 09/26/23 12:32 PM   Specimen: Nasopharyngeal Swab; Respiratory  Result Value Ref Range Status   Adenovirus NOT DETECTED NOT DETECTED Final   Coronavirus 229E NOT DETECTED NOT DETECTED Final    Comment: (NOTE) The Coronavirus on the Respiratory Panel, DOES NOT test for the novel  Coronavirus (2019 nCoV)    Coronavirus HKU1 NOT DETECTED NOT DETECTED Final   Coronavirus NL63 NOT DETECTED NOT DETECTED Final   Coronavirus OC43 NOT DETECTED NOT DETECTED Final   Metapneumovirus NOT DETECTED NOT DETECTED Final   Rhinovirus / Enterovirus NOT DETECTED NOT DETECTED Final   Influenza A NOT DETECTED NOT DETECTED Final   Influenza B NOT DETECTED NOT DETECTED Final   Parainfluenza Virus 1 NOT DETECTED NOT DETECTED Final   Parainfluenza Virus 2 NOT DETECTED NOT DETECTED Final   Parainfluenza Virus 3 NOT DETECTED NOT DETECTED Final   Parainfluenza Virus 4 NOT DETECTED NOT DETECTED Final   Respiratory Syncytial Virus NOT DETECTED NOT DETECTED Final   Bordetella pertussis NOT DETECTED NOT DETECTED Final   Bordetella Parapertussis NOT DETECTED NOT DETECTED Final   Chlamydophila pneumoniae NOT DETECTED NOT DETECTED Final   Mycoplasma pneumoniae NOT DETECTED NOT DETECTED Final    Comment: Performed at [facility], [address], [place], [place] [address]    Labs: CBC: Recent Labs  Lab 09/25/23 2108 09/26/23 0300  WBC 16.0* 10.7*  NEUTROABS 14.6*  --   HGB 11.6* 10.1*  HCT 35.8* 30.9*  MCV 92.3 92.5  PLT 233 202   Basic Metabolic Panel: Recent Labs  Lab 09/25/23 2108 09/26/23 0300  NA 135 138  K 4.8 4.5  CL 103 106  CO2 21* 24  GLUCOSE 192* 128*   BUN 16 16  CREATININE 1.40* 1.32*  CALCIUM 9.2 9.0   Liver Function Tests: Recent Labs  Lab 09/25/23 2108  AST 15  ALT 10  ALKPHOS 76  BILITOT 0.6  PROT 7.0  ALBUMIN 3.2*   CBG: Recent Labs  Lab 09/27/23 2013 09/27/23 2328 09/28/23 0337 09/28/23 0732 09/28/23 0942  GLUCAP 100* 123* 130* 85 103*    Discharge time spent: 35 minutes.  Signed: Aneita Keens, MD [facility] 09/28/2023

## 2023-09-28 NOTE — Progress Notes (Addendum)
 Report called to [facility], LPN @ [facility] no questions at this time. Patient will be transported to facility by his daughter  Alston Jerry @ discharge

## 2023-09-28 NOTE — TOC Progression Note (Signed)
 Transition of Care [facility]) - Progression Note    Patient Details  Name: Gregory [name] MRN: [id number] Date of Birth: Nov 15, 1960  Transition of Care [facility]) CM/SW Contact  Jarrell Merritts, LCSW Phone Number: 09/28/2023, 10:29 AM  Clinical Narrative:     CSW spoke with [place] at [facility] and she confirmed that pt can admit there today with a DC summary.  TOC team will continue to assist with discharge planning needs.        Expected Discharge Plan and Services                                               Social Determinants of Health (SDOH) Interventions SDOH Screenings   Food Insecurity: No Food Insecurity (09/26/2023)  Housing: Low Risk  (09/26/2023)  Transportation Needs: No Transportation Needs (09/26/2023)  Utilities: Not At Risk (09/26/2023)  Depression (PHQ2-9): Low Risk  (03/08/2021)  Social Connections: Socially Isolated (09/26/2023)  Tobacco Use: High Risk (09/25/2023)    Readmission Risk Interventions    09/04/2023    1:49 PM 09/06/2022    1:48 PM 08/27/2022   12:46 PM  Readmission Risk Prevention Plan  Transportation Screening Complete Complete Complete  Medication Review Oceanographer) Complete Complete Complete  PCP or Specialist appointment within 3-5 days of discharge Complete Complete Complete  HRI or Home Care Consult Complete Complete Complete  SW Recovery Care/Counseling Consult Complete Complete Complete  Palliative Care Screening Not Applicable Not Applicable Not Applicable  Skilled Nursing Facility Complete Complete Complete

## 2023-09-30 LAB — CULTURE, BLOOD (ROUTINE X 2)
Culture: NO GROWTH
Culture: NO GROWTH
Special Requests: ADEQUATE
Special Requests: ADEQUATE

## 2023-10-01 ENCOUNTER — Encounter (INDEPENDENT_AMBULATORY_CARE_PROVIDER_SITE_OTHER): Payer: Medicaid Other | Admitting: Ophthalmology

## 2023-10-01 DIAGNOSIS — E119 Type 2 diabetes mellitus without complications: Secondary | ICD-10-CM

## 2023-10-01 DIAGNOSIS — I10 Essential (primary) hypertension: Secondary | ICD-10-CM

## 2023-10-01 DIAGNOSIS — H25813 Combined forms of age-related cataract, bilateral: Secondary | ICD-10-CM

## 2023-10-01 DIAGNOSIS — H3582 Retinal ischemia: Secondary | ICD-10-CM

## 2023-10-01 DIAGNOSIS — H4720 Unspecified optic atrophy: Secondary | ICD-10-CM

## 2023-10-01 DIAGNOSIS — Z8673 Personal history of transient ischemic attack (TIA), and cerebral infarction without residual deficits: Secondary | ICD-10-CM

## 2023-10-01 DIAGNOSIS — H5442A5 Blindness left eye category 5, normal vision right eye: Secondary | ICD-10-CM

## 2023-10-01 DIAGNOSIS — H35362 Drusen (degenerative) of macula, left eye: Secondary | ICD-10-CM

## 2023-10-01 DIAGNOSIS — H35033 Hypertensive retinopathy, bilateral: Secondary | ICD-10-CM

## 2023-10-08 ENCOUNTER — Ambulatory Visit: Admitting: Family

## 2023-10-15 ENCOUNTER — Ambulatory Visit: Admitting: Orthopedic Surgery

## 2023-10-24 ENCOUNTER — Ambulatory Visit: Admitting: Orthopedic Surgery

## 2023-10-28 ENCOUNTER — Telehealth: Payer: Self-pay | Admitting: Podiatry

## 2023-10-28 NOTE — Telephone Encounter (Signed)
 PT would like a referral to [phone]

## 2023-12-03 ENCOUNTER — Emergency Department (HOSPITAL_COMMUNITY)

## 2023-12-03 ENCOUNTER — Other Ambulatory Visit: Payer: Self-pay

## 2023-12-03 ENCOUNTER — Emergency Department (HOSPITAL_COMMUNITY)
Admission: EM | Admit: 2023-12-03 | Discharge: 2023-12-03 | Disposition: A | Discharge status: Skilled Nursing Facility | Attending: Emergency Medicine | Admitting: Emergency Medicine

## 2023-12-03 ENCOUNTER — Other Ambulatory Visit (HOSPITAL_COMMUNITY): Payer: Self-pay

## 2023-12-03 DIAGNOSIS — E1122 Type 2 diabetes mellitus with diabetic chronic kidney disease: Secondary | ICD-10-CM | POA: Insufficient documentation

## 2023-12-03 DIAGNOSIS — I10 Essential (primary) hypertension: Secondary | ICD-10-CM

## 2023-12-03 DIAGNOSIS — N189 Chronic kidney disease, unspecified: Secondary | ICD-10-CM | POA: Diagnosis not present

## 2023-12-03 DIAGNOSIS — Z7982 Long term (current) use of aspirin: Secondary | ICD-10-CM | POA: Diagnosis not present

## 2023-12-03 DIAGNOSIS — N179 Acute kidney failure, unspecified: Secondary | ICD-10-CM | POA: Diagnosis not present

## 2023-12-03 DIAGNOSIS — Z794 Long term (current) use of insulin: Secondary | ICD-10-CM | POA: Diagnosis not present

## 2023-12-03 DIAGNOSIS — R7989 Other specified abnormal findings of blood chemistry: Secondary | ICD-10-CM | POA: Diagnosis not present

## 2023-12-03 DIAGNOSIS — I130 Hypertensive heart and chronic kidney disease with heart failure and stage 1 through stage 4 chronic kidney disease, or unspecified chronic kidney disease: Secondary | ICD-10-CM | POA: Diagnosis not present

## 2023-12-03 DIAGNOSIS — R569 Unspecified convulsions: Secondary | ICD-10-CM | POA: Diagnosis present

## 2023-12-03 DIAGNOSIS — Z5329 Procedure and treatment not carried out because of patient's decision for other reasons: Secondary | ICD-10-CM | POA: Insufficient documentation

## 2023-12-03 DIAGNOSIS — G40909 Epilepsy, unspecified, not intractable, without status epilepticus: Secondary | ICD-10-CM | POA: Diagnosis not present

## 2023-12-03 DIAGNOSIS — J449 Chronic obstructive pulmonary disease, unspecified: Secondary | ICD-10-CM | POA: Diagnosis not present

## 2023-12-03 DIAGNOSIS — I509 Heart failure, unspecified: Secondary | ICD-10-CM | POA: Insufficient documentation

## 2023-12-03 LAB — URINALYSIS, ROUTINE W REFLEX MICROSCOPIC
Bacteria, UA: NONE SEEN
Bilirubin Urine: NEGATIVE
Glucose, UA: NEGATIVE mg/dL
Hgb urine dipstick: NEGATIVE
Ketones, ur: NEGATIVE mg/dL
Leukocytes,Ua: NEGATIVE
Nitrite: NEGATIVE
Protein, ur: >=300 mg/dL — AB
Specific Gravity, Urine: 1.008 (ref 1.005–1.030)
pH: 7 (ref 5.0–8.0)

## 2023-12-03 LAB — RAPID URINE DRUG SCREEN, HOSP PERFORMED
Amphetamines: NOT DETECTED
Barbiturates: NOT DETECTED
Benzodiazepines: POSITIVE — AB
Cocaine: NOT DETECTED
Opiates: NOT DETECTED
Tetrahydrocannabinol: NOT DETECTED

## 2023-12-03 LAB — CBC WITH DIFFERENTIAL/PLATELET
Abs Immature Granulocytes: 0.07 10*3/uL (ref 0.00–0.07)
Basophils Absolute: 0.1 10*3/uL (ref 0.0–0.1)
Basophils Relative: 1 %
Eosinophils Absolute: 0.3 10*3/uL (ref 0.0–0.5)
Eosinophils Relative: 3 %
HCT: 41.6 % (ref 39.0–52.0)
Hemoglobin: 13.2 g/dL (ref 13.0–17.0)
Immature Granulocytes: 1 %
Lymphocytes Relative: 8 %
Lymphs Abs: 1 10*3/uL (ref 0.7–4.0)
MCH: 29.4 pg (ref 26.0–34.0)
MCHC: 31.7 g/dL (ref 30.0–36.0)
MCV: 92.7 fL (ref 80.0–100.0)
Monocytes Absolute: 0.6 10*3/uL (ref 0.1–1.0)
Monocytes Relative: 5 %
Neutro Abs: 10.5 10*3/uL — ABNORMAL HIGH (ref 1.7–7.7)
Neutrophils Relative %: 82 %
Platelets: 187 10*3/uL (ref 150–400)
RBC: 4.49 MIL/uL (ref 4.22–5.81)
RDW: 14.1 % (ref 11.5–15.5)
WBC: 12.6 10*3/uL — ABNORMAL HIGH (ref 4.0–10.5)
nRBC: 0 % (ref 0.0–0.2)

## 2023-12-03 LAB — COMPREHENSIVE METABOLIC PANEL WITH GFR
ALT: 9 U/L (ref 0–44)
AST: 14 U/L — ABNORMAL LOW (ref 15–41)
Albumin: 3 g/dL — ABNORMAL LOW (ref 3.5–5.0)
Alkaline Phosphatase: 104 U/L (ref 38–126)
Anion gap: 12 (ref 5–15)
BUN: 21 mg/dL (ref 8–23)
CO2: 24 mmol/L (ref 22–32)
Calcium: 9.4 mg/dL (ref 8.9–10.3)
Chloride: 104 mmol/L (ref 98–111)
Creatinine, Ser: 1.73 mg/dL — ABNORMAL HIGH (ref 0.61–1.24)
GFR, Estimated: 44 mL/min — ABNORMAL LOW (ref >60)
Glucose, Bld: 161 mg/dL — ABNORMAL HIGH (ref 70–99)
Potassium: 4.1 mmol/L (ref 3.5–5.1)
Sodium: 140 mmol/L (ref 135–145)
Total Bilirubin: 0.6 mg/dL (ref 0.0–1.2)
Total Protein: 6.8 g/dL (ref 6.5–8.1)

## 2023-12-03 LAB — I-STAT CHEM 8, ED
BUN: 28 mg/dL — ABNORMAL HIGH (ref 8–23)
Calcium, Ion: 1.16 mmol/L (ref 1.15–1.40)
Chloride: 105 mmol/L (ref 98–111)
Creatinine, Ser: 1.7 mg/dL — ABNORMAL HIGH (ref 0.61–1.24)
Glucose, Bld: 162 mg/dL — ABNORMAL HIGH (ref 70–99)
HCT: 40 % (ref 39.0–52.0)
Hemoglobin: 13.6 g/dL (ref 13.0–17.0)
Potassium: 4.4 mmol/L (ref 3.5–5.1)
Sodium: 141 mmol/L (ref 135–145)
TCO2: 25 mmol/L (ref 22–32)

## 2023-12-03 LAB — CBG MONITORING, ED: Glucose-Capillary: 182 mg/dL — ABNORMAL HIGH (ref 70–99)

## 2023-12-03 LAB — RESP PANEL BY RT-PCR (RSV, FLU A&B, COVID)  RVPGX2
Influenza A by PCR: NEGATIVE
Influenza B by PCR: NEGATIVE
Resp Syncytial Virus by PCR: NEGATIVE
SARS Coronavirus 2 by RT PCR: NEGATIVE

## 2023-12-03 LAB — ETHANOL: Alcohol, Ethyl (B): <15 mg/dL (ref <15)

## 2023-12-03 LAB — TROPONIN I (HIGH SENSITIVITY): Troponin I (High Sensitivity): 77 ng/L — ABNORMAL HIGH (ref <18)

## 2023-12-03 LAB — BRAIN NATRIURETIC PEPTIDE: B Natriuretic Peptide: 241.3 pg/mL — ABNORMAL HIGH (ref 0.0–100.0)

## 2023-12-03 LAB — MAGNESIUM: Magnesium: 1.6 mg/dL — ABNORMAL LOW (ref 1.7–2.4)

## 2023-12-03 MED ORDER — SALINE SPRAY 0.65 % NA SOLN
1.0000 | NASAL | Status: DC | PRN
  Filled 2023-12-03: qty 44

## 2023-12-03 MED ORDER — SODIUM CHLORIDE 0.9 % IV BOLUS
1000.0000 mL | Freq: Once | INTRAVENOUS | Status: AC
  Administered 2023-12-03: 1000 mL via INTRAVENOUS

## 2023-12-03 MED ORDER — LEVETIRACETAM (KEPPRA) 500 MG/5 ML ADULT IV PUSH
2000.0000 mg | Freq: Once | INTRAVENOUS | Status: AC
  Administered 2023-12-03: 2000 mg via INTRAVENOUS

## 2023-12-03 MED ORDER — MAGNESIUM SULFATE 2 GM/50ML IV SOLN
2.0000 g | Freq: Once | INTRAVENOUS | Status: AC
  Administered 2023-12-03: 2 g via INTRAVENOUS
  Filled 2023-12-03: qty 50

## 2023-12-03 MED ORDER — LEVETIRACETAM 500 MG PO TABS
500.0000 mg | ORAL_TABLET | Freq: Two times a day (BID) | ORAL | 0 refills | Status: AC
  Filled 2023-12-03: qty 180, 90d supply, fill #0

## 2023-12-03 MED ORDER — LORAZEPAM 1 MG PO TABS
0.5000 mg | ORAL_TABLET | Freq: Once | ORAL | Status: AC
  Administered 2023-12-03: 0.5 mg via ORAL
  Filled 2023-12-03: qty 1

## 2023-12-03 NOTE — ED Triage Notes (Addendum)
 Pt BIB [facility] from [facility]. Staff witnessed tonic clonic seizure for 4 minutes. EMS reports patient was independent this am, patient was outside smoking when seizure occurred.  Pt responds to painful stimuli and some voice commands No hx of seizures  EMS admin 5mg  versed  IM 158/82 90HR 23 R 97%2L 156 CBG

## 2023-12-03 NOTE — ED Notes (Signed)
 Pt lying in stretcher, eyes closed, visible chest rise and fall. No distress noted, patient on cardiac monitor

## 2023-12-03 NOTE — ED Notes (Signed)
 Unable to do MRI due to pt not being able to lay flat

## 2023-12-03 NOTE — Consult Note (Addendum)
 NEUROLOGY CONSULT NOTE   Date of service: December 03, 2023 Patient Name: Gregory [name] MRN:  [id number] DOB:  [birth year] Chief Complaint: stroke code Requesting Provider: Rosealee Concha, MD  History of Present Illness  Gregory [name] is a 63 y.o. 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  Per chart review, patient was smoking outside when he had the seizure.  He arrived to the ED and was drowsy/lethargic.  He was observed in the ED but a code stroke was activated for persistent somnolence and concern for left-sided weakness.  Patient had a CT head without contrast earlier in the ED which was negative for acute intracranial normalities.  On my evaluation, patient is initially significantly somnolent and difficult to arouse.  With provocative maneuvers including swearing saline his eyes, patient is able to wake up and appropriately answers questions and follows commands.  He is noted to have left hemianopsia but that is chronic and known from his prior right PCA infarct.  He has no other focal deficit.  LKW: 6644 on 12/03/2023 Modified rankin score: 4-Needs assistance to walk and tend to bodily needs IV Thrombolysis: Not offered, low suspicion for stroke.   EVT: Not offered, low suspicion for stroke.    NIHSS components Score: Comment  1a Level of Conscious 0[]  1[x]  2[]  3[]      1b LOC Questions 0[x]  1[]  2[]       1c LOC Commands 0[x]  1[]  2[]       2 Best Gaze 0[x]  1[]  2[]       3 Visual 0[]  1[x]  2[]  3[]    Inconsistent responses to visual field testing in left Hemi visual field.  4 Facial Palsy 0[x]  1[]  2[]  3[]      5a Motor Arm - left 0[x]  1[]  2[]  3[]  4[]  UN[]    5b Motor Arm - Right 0[x]  1[]  2[]  3[]  4[]  UN[]    6a Motor Leg - Left 0[x]  1[]  2[]  3[]  4[]  UN[]  Left above-knee amputation but able to hold the stump off the bed for more than 5 seconds  without any drift  6b Motor Leg - Right 0[x]  1[]  2[]  3[]  4[]  UN[]    7 Limb Ataxia 0[x]  1[]  2[]  UN[]      8 Sensory 0[x]  1[]  2[]  UN[]      9 Best Language 0[x]  1[]  2[]  3[]      10 Dysarthria 0[]  1[x]  2[]  UN[]    Suspect noted slight slurring of the speech is secondary to drowsiness.  11 Extinct. and Inattention 0[x]  1[]  2[]       TOTAL: 3      ROS  Unable to ascertain due to drowsiness.  Past History   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    Chronic pain syndrome    COPD (chronic obstructive pulmonary disease) (HCC)  DDD (degenerative disc disease), lumbar    Depression    major depressive disorder    Diabetes mellitus without complication (HCC)    type 2    Diabetic ulcer of toe of left foot (HCC) 10/02/2016   Difficult intubation    Difficulty in walking, not elsewhere classified    Epidural abscess 10/02/2016   Foot drop, bilateral    Foot drop, right 12/27/2015   GERD (gastroesophageal reflux disease)    Herpesviral vesicular dermatitis    History of COVID-19 03/15/2022   Hyperlipidemia    Hyperlipidemia    Hypertension    Insomnia    Malignant melanoma of other parts of face (HCC)    Melanoma (HCC)    MRSA bacteremia    Muscle weakness (generalized)    Nasal congestion    Necrosis of toe (HCC) 07/08/2018   Neuromuscular dysfunction of bladder    Osteomyelitis (HCC)    Osteomyelitis (HCC)    Osteomyelitis (HCC)    right BKA   Other idiopathic peripheral autonomic  neuropathy    Retention of urine, unspecified    Squamous cell carcinoma of skin    Stroke (HCC)    Unsteadiness on feet    Urinary retention    Urinary retention    Urinary tract infection    Wears dentures    Wears glasses     Past Surgical History:  Procedure Laterality Date   AMPUTATION Left 03/29/2017   Procedure: LEFT GREAT TOE AMPUTATION AT METATARSOPHALANGEAL JOINT;  Surgeon: Timothy Ford, MD;  Location: [facility] OR;  Service: Orthopedics;  Laterality: Left;   AMPUTATION Right 03/29/2017   Procedure: RIGHT BELOW KNEE AMPUTATION;  Surgeon: Timothy Ford, MD;  Location: [facility] OR;  Service: Orthopedics;  Laterality: Right;   AMPUTATION Left 06/06/2018   Procedure: LEFT 2ND TOE AMPUTATION;  Surgeon: Timothy Ford, MD;  Location: [facility] OR;  Service: Orthopedics;  Laterality: Left;   AMPUTATION Right 11/19/2018   Procedure: AMPUTATION ABOVE KNEE;  Surgeon: Timothy Ford, MD;  Location: [facility] OR;  Service: Orthopedics;  Laterality: Right;   ANTERIOR CERVICAL CORPECTOMY N/A 11/25/2015   Procedure: ANTERIOR CERVICAL FIVE CORPECTOMY Cervical four - six fusion;  Surgeon: Augusto Blonder, MD;  Location: [facility] OR[facility];  Service: Neurosurgery;  Laterality: N/A;  ANTERIOR CERVICAL FIVE CORPECTOMY Cervical four - six fusion   APPLICATION OF WOUND VAC Right 10/22/2018   Procedure: Application Of Wound Vac;  Surgeon: Timothy Ford, MD;  Location: [facility] OR;  Service: Orthopedics;  Laterality: Right;   CYSTOSCOPY WITH BIOPSY N/A 05/01/2022   Procedure: CYSTOSCOPY WITH URETHRAL BIOPSY;  Surgeon: Roxane Copp, MD;  Location: [facility] ORS;  Service: Urology;  Laterality: N/A;  1 HR   CYSTOSCOPY WITH RETROGRADE URETHROGRAM N/A 04/25/2018   Procedure: CYSTOSCOPY WITH RETROGRADE URETHROGRAM/ BALLOON DILATION;  Surgeon: Ottelin, Mark, MD;  Location: [facility] ORS;  Service: Urology;  Laterality: N/A;   CYSTOSCOPY WITH URETHRAL DILATATION N/A 05/01/2022   Procedure: BALLOON DILATION WITH  OPTILUME;  Surgeon: Roxane Copp, MD;   Location: [facility] ORS;  Service: Urology;  Laterality: N/A;   LAPAROTOMY N/A 12/03/2022   Procedure: EXPLORATORY LAPAROTOMY  total abdominal colectomy with end ileostomy;  Surgeon: Kinsinger, Alphonso Aschoff, MD;  Location: [facility] OR;  Service: General;  Laterality: N/A;   MULTIPLE TOOTH EXTRACTIONS     POSTERIOR CERVICAL FUSION/FORAMINOTOMY N/A 11/29/2015   Procedure: Cervical Three-Cervical Seven Posterior Cervical Laminectomy with Fusion;  Surgeon: Augusto Blonder, MD;  Location: [facility];  Service:  Neurosurgery;  Laterality: N/A;  Cervical Three-Cervical Seven Posterior Cervical Laminectomy with Fusion   STUMP REVISION Right 10/22/2018   Procedure: REVISION RIGHT BELOW KNEE AMPUTATION;  Surgeon: Timothy Ford, MD;  Location: [facility] OR;  Service: Orthopedics;  Laterality: Right;   STUMP REVISION Right 12/05/2018   Procedure: Revision Right Above Knee Amputation;  Surgeon: Timothy Ford, MD;  Location: [facility] OR;  Service: Orthopedics;  Laterality: Right;   STUMP REVISION Right 05/29/2019   Procedure: REVISION RIGHT ABOVE KNEE AMPUTATION;  Surgeon: Timothy Ford, MD;  Location: [facility] OR;  Service: Orthopedics;  Laterality: Right;   STUMP REVISION Right 06/24/2019   Procedure: STUMP REVISION;  Surgeon: Timothy Ford, MD;  Location: [facility] OR;  Service: Orthopedics;  Laterality: Right;    Family History: Family History  Problem Relation Age of Onset   Diabetes Mother    Heart disease Mother    Cancer Mother    Cancer Father    Bone cancer Father    Prostate cancer Father    Cancer Paternal Grandfather    Cancer Other    Diabetes Other     Social History  reports that he has been smoking cigarettes. He has never used smokeless tobacco. He reports that he does not drink alcohol  and does not use drugs.  Allergies  Allergen Reactions   Latex Other (See Comments)    Unknown reaction (MAR)    Medications  No current facility-administered medications for this encounter.  Current Outpatient Medications:     acetaminophen  (TYLENOL ) 325 MG tablet, Take 2 tablets (650 mg total) by mouth every 6 (six) hours as needed for mild pain (pain score 1-3) (or Fever >/= 101). (Patient taking differently: Take 650 mg by mouth in the morning and at bedtime.), Disp: , Rfl:    aspirin  EC 81 MG tablet, Take 81 mg by mouth daily., Disp: , Rfl:    atorvastatin  (LIPITOR ) 80 MG tablet, Take 1 tablet (80 mg total) by mouth daily., Disp: 30 tablet, Rfl: 0   Cholecalciferol  25 MCG (1000 UT) capsule, Take 2,000 Units by mouth daily., Disp: , Rfl:    Dextran 70-Hypromellose (NATURAL BALANCE TEARS) 0.1-0.3 % SOLN, Place 1 drop into both eyes every 4 (four) hours as needed (dry eyes)., Disp: , Rfl:    docusate sodium  (COLACE) 100 MG capsule, Take 100 mg by mouth every 12 (twelve) hours as needed (constipation)., Disp: , Rfl:    escitalopram  (LEXAPRO ) 20 MG tablet, Take 1 tablet (20 mg total) by mouth daily., Disp: 5 tablet, Rfl: 0   ferrous sulfate  325 (65 FE) MG tablet, Take 1 tablet (325 mg total) by mouth 3 (three) times daily with meals., Disp: 90 tablet, Rfl: 0   insulin  lispro (HUMALOG ) 100 UNIT/ML KwikPen, Inject 0-10 Units into the skin See admin instructions. Inject 0-10 units into the skin three times a day and at bedtime, PER SLIDING SCALE:  BGL 151-200 = 2 units 201-250 = 4 units 251-300 = 6 units 301-350 =  8 units 351-400 = 10 units (Patient taking differently: Inject 0-10 Units into the skin 4 (four) times daily -  before meals and at bedtime. Inject 0-10 units into the skin before meals and at bedtime, PER SLIDING SCALE:  0-150 = 0 units 151-200 = 2 units 201-250 = 4 units 251-300 = 6 units 301-350 =  8 units 351-400 = 10 units), Disp: 15 mL, Rfl: 11   ipratropium-albuterol  (DUONEB) 0.5-2.5 (3) MG/3ML SOLN, Take 3 mLs by nebulization every  6 (six) hours as needed (shortness of breath, wheezing)., Disp: , Rfl:    lactulose  (CHRONULAC ) 10 GM/15ML solution, Take 30 mLs (20 g total) by mouth 2 (two) times daily., Disp: , Rfl:     lidocaine  4 %, Place 1 patch onto the skin daily. Apply to knee., Disp: , Rfl:    magnesium  oxide (MAG-OX) 400 (240 Mg) MG tablet, Take 400 mg by mouth in the morning, at noon, and at bedtime., Disp: , Rfl:    Melatonin 10 MG TABS, Take 10 mg by mouth at bedtime., Disp: , Rfl:    Meth-Hyo-M Bl-Na Phos-Ph Sal (URO-MP) 118 MG CAPS, Take 118 mg by mouth 2 (two) times daily., Disp: , Rfl:    Multiple Vitamin (MULTIVITAMIN WITH MINERALS) TABS tablet, Take 1 tablet by mouth daily., Disp: , Rfl:    naloxone  (NARCAN ) nasal spray 4 mg/0.1 mL, Place 1 spray into the nose See admin instructions. 1 spray alternating nostrils every 5 minutes as needed for opiate overdose. (Patient not taking: Reported on 09/26/2023), Disp: , Rfl:    omeprazole (PRILOSEC) 20 MG capsule, Take 20 mg by mouth at bedtime., Disp: , Rfl:    ondansetron  (ZOFRAN ) 4 MG tablet, Take 4 mg by mouth every 6 (six) hours as needed for nausea or vomiting., Disp: , Rfl:    OXcarbazepine  (TRILEPTAL ) 150 MG tablet, Take 1 tablet (150 mg total) by mouth 2 (two) times daily., Disp: 10 tablet, Rfl: 0   oxyCODONE  (OXY IR/ROXICODONE ) 5 MG immediate release tablet, Take 1 tablet (5 mg total) by mouth every 6 (six) hours as needed for severe pain (pain score 7-10)., Disp: 10 tablet, Rfl: 0   oxyCODONE  (ROXICODONE ) 15 MG immediate release tablet, Take 1 tablet (15 mg total) by mouth in the morning, at noon, and at bedtime., Disp: 10 tablet, Rfl: 0   pregabalin  (LYRICA ) 150 MG capsule, Take 1 capsule (150 mg total) by mouth 2 (two) times daily., Disp: 10 capsule, Rfl: 0   prochlorperazine  (COMPAZINE ) 5 MG tablet, Take 5 mg by mouth every 8 (eight) hours as needed for nausea or vomiting., Disp: , Rfl:    senna (SENOKOT) 8.6 MG TABS tablet, Take 2 tablets by mouth at bedtime., Disp: , Rfl:    sodium bicarbonate  650 MG tablet, Take 650 mg by mouth 2 (two) times daily., Disp: , Rfl:    tamsulosin  (FLOMAX ) 0.4 MG CAPS capsule, Take 1 capsule (0.4 mg total) by  mouth daily., Disp: , Rfl:    UNABLE TO FIND, Apply 1 application  topically daily. Med Name: Dori Garbe  Apply to buttocks for 14 days (08/21/23-09/04/23) (Patient not taking: Reported on 09/26/2023), Disp: , Rfl:   Vitals   Vitals:   12/03/23 0753 12/03/23 0754 12/03/23 0800  BP:  (!) 155/77 139/71  Pulse:  75 72  Resp:   15  Temp:  98.7 F (37.1 C)   TempSrc:  Rectal   SpO2: 97% 98% 97%    There is no height or weight on file to calculate BMI.   Physical Exam   General: Laying comfortably in bed; in no acute distress.  HENT: Normal oropharynx and mucosa. Normal external appearance of ears and nose.  Neck: Supple, no pain or tenderness  CV: No JVD. No peripheral edema.  Pulmonary: Symmetric Chest rise. Normal respiratory effort.  Abdomen: Soft to touch, non-tender.  Ext: No cyanosis, edema, left above-knee amputation. Skin: No rash. Normal palpation of skin.   Musculoskeletal: Normal digits and nails by inspection.  No clubbing.   Neurologic Examination  Mental status/Cognition: Drowsy, oriented to self, place, month and year, good attention. Speech/language: Slurred speech, fluent, comprehension intact, object naming intact, repetition intact. Cranial nerves:   CN II Pupils equal and reactive to light, inconsistent responses to visual field testing in left Hemi visual field.   CN III,IV,VI EOM intact, no gaze preference or deviation, no nystagmus    CN V normal sensation in V1, V2, and V3 segments bilaterally    CN VII no asymmetry, no nasolabial fold flattening    CN VIII normal hearing to speech    CN IX & X normal palatal elevation, no uvular deviation    CN XI 5/5 head turn and 5/5 shoulder shrug bilaterally    CN XII midline tongue protrusion    Motor:  Muscle bulk: normal, tone normal, pronator drift none tremor none Mvmt Root Nerve  Muscle Right Left Comments  SA C5/6 Ax Deltoid 5 5   EF C5/6 Mc Biceps 5 5   EE C6/7/8 Rad Triceps 5 5   WF C6/7 Med FCR     WE  C7/8 PIN ECU     F Ab C8/T1 U ADM/FDI 5 5   HF L1/2/3 Fem Illopsoas 4+ 4+ Left above-knee amputation.  KE L2/3/4 Fem Quad     DF L4/5 D Peron Tib Ant 5    PF S1/2 Tibial Grc/Sol 5     Sensation:  Light touch Intact throughout   Pin prick    Temperature    Vibration   Proprioception    Coordination/Complex Motor:  - Finger to Nose intact bilaterally - Heel to shin unable to do secondary to left above-knee amputation. - Rapid alternating movement are slowed bilaterally - Gait: Deferred gait for patient safety. Labs/Imaging/Neurodiagnostic studies   CBC:  Recent Labs  Lab 12-21-2023 0838 12-21-23 0918  WBC  --  12.6*  NEUTROABS  --  10.5*  HGB 13.6 13.2  HCT 40.0 41.6  MCV  --  92.7  PLT  --  187   Basic Metabolic Panel:  Lab Results  Component Value Date   NA 141 21-Dec-2023   K 4.4 12-21-2023   CO2 24 09/26/2023   GLUCOSE 162 (H) 12-21-2023   BUN 28 (H) 21-Dec-2023   CREATININE 1.70 (H) 2023/12/21   CALCIUM  9.0 09/26/2023   GFRNONAA >60 09/26/2023   GFRAA 42 (L) 02/24/2020   Lipid Panel:  Lab Results  Component Value Date   LDLCALC UNABLE TO CALCULATE IF TRIGLYCERIDE OVER 400 mg/dL 16/03/9603   [id number]:  Lab Results  Component Value Date   HGBA1C 6.4 (H) 06/27/2023   Urine Drug Screen:     Component Value Date/Time   LABOPIA NONE DETECTED 09/02/2023 1104   COCAINSCRNUR NONE DETECTED 09/02/2023 1104   LABBENZ NONE DETECTED 09/02/2023 1104   AMPHETMU NONE DETECTED 09/02/2023 1104   THCU POSITIVE (A) 09/02/2023 1104   LABBARB NONE DETECTED 09/02/2023 1104    Alcohol  Level     Component Value Date/Time   ETH <10 09/02/2023 1104   INR  Lab Results  Component Value Date   INR 1.0 09/25/2023   APTT  Lab Results  Component Value Date   APTT 32 06/27/2023   AED levels: No results found for: PHENYTOIN, ZONISAMIDE, LAMOTRIGINE, LEVETIRACETA  CT Head without contrast(Personally reviewed): CTH was negative for a large hypodensity concerning for  a large territory infarct or hyperdensity concerning for an ICH   Neurodiagnostics rEEG:  Pending  ASSESSMENT  Gregory [name] is a 63 y.o. 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  He was initially very drowsy and there was some concern for possible left-sided weakness and a code stroke was activated.  On my evaluation, he is coming around and except for the known left hemianopsia, he has no focal deficit.  I suspect that known right PCA infarct was a likely seizure focus.  RECOMMENDATIONS  - Keppra 2000 mg IV once, followed by Keppra 500 mg twice daily. -Follow-up with neurology outpatient. -Will get a routine EEG here. - Seizure precautions. - Ativan  1 to 2 mg IV as needed for seizure lasting more than 3 minutes. - No driving for 6 months.  Has to be seizure-free before he can resume driving. -Full discharge seizure precautions listed at the bottom of this note. - Code stroke canceled. This was discussed with Dr. Adrain Alar too. ______________________________________________________________________  Plan was discussed with Dr. Adrain Alar in person at the bedside.  Signed, Javiana Anwar, MD [facility]   Seizure precautions: Per [place]  DMV statutes, patients with seizures are not allowed to drive until they have been seizure-free for six months and cleared by a physician    Use caution when using heavy equipment or power tools. Avoid working on ladders or at heights. Take showers instead of baths. Ensure the water  temperature is not too high on the home water  heater. Do not go swimming alone. Do not lock yourself in a room alone (i.e. bathroom). When caring for infants or small children, sit down when holding, feeding, or changing them to minimize risk of injury to the child in the event you have a seizure.  Maintain good sleep hygiene. Avoid alcohol .    If patient has another seizure, call 911 and bring them back to the ED if: A.  The seizure lasts longer than 5 minutes.      B.  The patient doesn't wake shortly after the seizure or has new problems such as difficulty seeing, speaking or moving following the seizure C.  The patient was injured during the seizure D.  The patient has a temperature over 102 F (39C) E.  The patient vomited during the seizure and now is having trouble breathing    During the Seizure   - First, ensure adequate ventilation and place patients on the floor on their left side  Loosen clothing around the neck and ensure the airway is patent. If the patient is clenching the teeth, do not force the mouth open with any object as this can cause severe damage - Remove all items from the surrounding that can be hazardous. The patient may be oblivious to what's happening and may not even know what he or she is doing. If the patient is confused and wandering, either gently guide him/her away and block access to outside areas - Reassure the individual and be comforting - Call 911. In most cases, the seizure ends before EMS arrives. However, there are cases when seizures may last over 3 to 5 minutes. Or the individual may have developed breathing difficulties or severe injuries. If a pregnant patient or a person with diabetes develops a seizure, it is prudent to call an ambulance. - Finally, if the patient does not regain full consciousness, then call EMS. Most patients will remain confused for about 45 to 90 minutes after a seizure, so you must  use judgment in calling for help. - Avoid restraints but make sure the patient is in a bed with padded side rails - Place the individual in a lateral position with the neck slightly flexed; this will help the saliva drain from the mouth and prevent the tongue from falling backward - Remove all nearby furniture and other hazards from the area -  Provide verbal assurance as the individual is regaining consciousness - Provide the patient with privacy if possible - Call for help and start treatment as ordered by the caregiver    After the Seizure (Postictal Stage)   After a seizure, most patients experience confusion, fatigue, muscle pain and/or a headache. Thus, one should permit the individual to sleep. For the next few days, reassurance is essential. Being calm and helping reorient the person is also of importance.   Most seizures are painless and end spontaneously. Seizures are not harmful to others but can lead to complications such as stress on the lungs, brain and the heart. Individuals with prior lung problems may develop labored breathing and respiratory distress.

## 2023-12-03 NOTE — ED Notes (Signed)
 Pt in MRI.

## 2023-12-03 NOTE — Discharge Instructions (Addendum)
 He sustained new-onset seizure for which further workup was recommended with MRI imaging.  Neurology was consulted.  He will be started on Keppra twice daily and a referral has been placed for outpatient follow-up with neurology.  As part of your workup you are found to have an acute kidney injury and your blood pressure was significantly high.  There was some evidence of a small amount fluid on your lungs.  It was recommended that you be admitted for further management however after discussion you have elected to sign out AGAINST MEDICAL ADVICE.  You understand the risk of signing out AGAINST MEDICAL ADVICE to include significant morbidity and mortality in the setting of incomplete medical workup and management and have demonstrated the capacity to do so.  Seizure precautions: Per [place]  DMV statutes, patients with seizures are not allowed to drive until they have been seizure-free for six months and cleared by a physician    Use caution when using heavy equipment or power tools. Avoid working on ladders or at heights. Take showers instead of baths. Ensure the water  temperature is not too high on the home water  heater. Do not go swimming alone. Do not lock yourself in a room alone (i.e. bathroom). When caring for infants or small children, sit down when holding, feeding, or changing them to minimize risk of injury to the child in the event you have a seizure. Maintain good sleep hygiene. Avoid alcohol .    If patient has another seizure, call 911 and bring them back to the ED if: A.  The seizure lasts longer than 5 minutes.      B.  The patient doesn't wake shortly after the seizure or has new problems such as difficulty seeing, speaking or moving following the seizure C.  The patient was injured during the seizure D.  The patient has a temperature over 102 F (39C) E.  The patient vomited during the seizure and now is having trouble breathing    During the Seizure   - First, ensure adequate  ventilation and place patients on the floor on their left side  Loosen clothing around the neck and ensure the airway is patent. If the patient is clenching the teeth, do not force the mouth open with any object as this can cause severe damage - Remove all items from the surrounding that can be hazardous. The patient may be oblivious to what's happening and may not even know what he or she is doing. If the patient is confused and wandering, either gently guide him/her away and block access to outside areas - Reassure the individual and be comforting - Call 911. In most cases, the seizure ends before EMS arrives. However, there are cases when seizures may last over 3 to 5 minutes. Or the individual may have developed breathing difficulties or severe injuries. If a pregnant patient or a person with diabetes develops a seizure, it is prudent to call an ambulance. - Finally, if the patient does not regain full consciousness, then call EMS. Most patients will remain confused for about 45 to 90 minutes after a seizure, so you must use judgment in calling for help. - Avoid restraints but make sure the patient is in a bed with padded side rails - Place the individual in a lateral position with the neck slightly flexed; this will help the saliva drain from the mouth and prevent the tongue from falling backward - Remove all nearby furniture and other hazards from the area - Provide verbal assurance  as the individual is regaining consciousness - Provide the patient with privacy if possible - Call for help and start treatment as ordered by the caregiver    After the Seizure (Postictal Stage)   After a seizure, most patients experience confusion, fatigue, muscle pain and/or a headache. Thus, one should permit the individual to sleep. For the next few days, reassurance is essential. Being calm and helping reorient the person is also of importance.   Most seizures are painless and end spontaneously. Seizures are  not harmful to others but can lead to complications such as stress on the lungs, brain and the heart. Individuals with prior lung problems may develop labored breathing and respiratory distress.

## 2023-12-03 NOTE — ED Notes (Signed)
 EEG at bedside. MRI called. Will call MRI back when EEG is done

## 2023-12-03 NOTE — ED Notes (Signed)
 Tegeler spoke with patient

## 2023-12-03 NOTE — ED Notes (Signed)
[facility] called, 8th in line ?

## 2023-12-03 NOTE — ED Notes (Signed)
 Gave patient sandwhich and coke. Patient continues to yell [place]

## 2023-12-03 NOTE — ED Notes (Signed)
 Emptied patient's colostomy bag and changed linens

## 2023-12-03 NOTE — Procedures (Signed)
 Patient Name: Gregory [name]  MRN: [id number]  Epilepsy Attending: Arleene Lack  Referring Physician/Provider: Khaliqdina, Salman, MD  Date: 12/03/2023 Duration: 25.03 mins  Patient history: 63 y.o. 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 EEG to evaluate for seizure  Level of alertness: Awake, asleep  AEDs during EEG study: LEV, Ativan   Technical aspects: This EEG study was done with scalp electrodes positioned according to the 10-20 International system of electrode placement. Electrical activity was reviewed with band pass filter of 1-70Hz , sensitivity of 7 uV/mm, display speed of 61mm/sec with a 60Hz  notched filter applied as appropriate. EEG data were recorded continuously and digitally stored.  Video monitoring was available and reviewed as appropriate.  Description: The posterior dominant rhythm consists of 7.5 Hz activity of moderate voltage (25-35 uV) seen predominantly in posterior head regions, symmetric and reactive to eye opening and eye closing. Sleep was characterized by vertex waves, sleep spindles (12 to 14 Hz), maximal frontocentral region. EEG showed continuous 3 to 6 Hz theta-delta slowing in right posterior quadrant. Hyperventilation and photic stimulation were not performed.     ABNORMALITY - Continuous slow, right posterior quadrant.  IMPRESSION: This study is suggestive of cortical dysfunction arising from right posterior quadrant likely secondary to underlying structural abnormality. No seizures or epileptiform discharges were seen throughout the recording.  Travious Vanover O Nylee Barbuto

## 2023-12-03 NOTE — ED Provider Notes (Addendum)
 5:13 PM Care assumed from Dr. Adrain Alar while patient was in the process of being admitted.  Patient says he does not want to be admitted and wants to be discharged.  He would like to go home.  He will thus be leaving AGAINST MEDICAL ADVICE.  He told me he understands where he is, what is going on, and that he understands he could die.  He still wants to go home.  Will discharge AGAINST MEDICAL ADVICE per previous plan.    Clinical Impression: 1. Seizure (HCC)   2. AKI (acute kidney injury) (HCC)   3. Hypertension, unspecified type     Disposition: AMA   New Prescriptions   LEVETIRACETAM (KEPPRA) 500 MG TABLET    Take 1 tablet (500 mg total) by mouth 2 (two) times daily.    Follow Up: Your PCP  Schedule an appointment as soon as possible for a visit in 3 days For a recheck of your renal function     Tharun Cappella, Marine Sia, MD 12/03/23 1714   8:15 PM Patient continues to yell and scream asking for various things and medications.  We discussed that since he is leaving his medical advice, he is foregoing the care we would like to give him and he is leaving at his own risk.  He requested some nasal spray so we did give him some nasal spray however with some of his breathing difficulties earlier and altered mental status, I do not feel it is appropriate to give him narcotic pain medicine that he is yelling about right now.  He is awaiting his transportation home as he is still leaving AGAINST MEDICAL ADVICE.    Halona Amstutz, Marine Sia, MD 12/03/23 2017

## 2023-12-03 NOTE — ED Notes (Signed)
 CCM called pt off tele for MRI.

## 2023-12-03 NOTE — ED Notes (Signed)
 Patient transported to MRI

## 2023-12-03 NOTE — Progress Notes (Signed)
 EEG complete - results pending

## 2023-12-03 NOTE — ED Notes (Signed)
 Pt continues to yell very loudly doctor nurse Patient has been educated numerous times on not yelling. Pt has spoken to the provider several times.

## 2023-12-03 NOTE — ED Notes (Signed)
 Nt called CCMD@7 :46am

## 2023-12-03 NOTE — ED Notes (Signed)
 Staffing called for sitter, no sitters at this time.

## 2023-12-03 NOTE — ED Provider Notes (Signed)
 [place] EMERGENCY DEPARTMENT AT [facility] Provider Note   CSN: [id number] Arrival date & time: 12/03/23  9263     Patient presents with: Seizures   Gregory [name] is a 63 y.o. male.   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  The patient presents from [facility] with witnessed generalized shaking behavior that lasted for around 4 minutes.  On EMS arrival the patient had a CBG of 156.  Was administered 5 mg of Versed  IM with subsequent resolution of seizure-like activity.  The patient is amnestic to the events.  He arrives somnolent but arousable to verbal stimuli. CBG 182 on arrival.  The patient reportedly has no history of seizure disorder, does have a history of hyponatremia. Unclear last known normal per EMS.   Prior to Admission medications   Medication Sig Start Date End Date Taking? Authorizing Provider  acetaminophen  (TYLENOL ) 325 MG tablet Take 2 tablets (650 mg total) by mouth every 6 (six) hours as needed for mild pain (pain score 1-3) (or Fever >/= 101). 09/08/23  Yes Elgergawy, Brayton RAMAN, MD  aspirin  EC 81 MG tablet Take 81 mg by mouth daily.   Yes [provider]  atorvastatin  (LIPITOR ) 80 MG tablet Take 1 tablet (80 mg total) by mouth daily. 09/22/22 01/27/24 Yes Cindy Garnette POUR, MD  busPIRone (BUSPAR) 5 MG tablet Take 5 mg by mouth 2 (two) times daily.   Yes [provider]  Cholecalciferol  25 MCG (1000 UT) capsule Take 2,000 Units by mouth daily.   Yes [provider]  docusate sodium  (COLACE) 100 MG capsule Take 100 mg by mouth 2 (two) times daily.   Yes [provider]  Dulaglutide  0.75  MG/0.5ML SOAJ Inject 0.75 mg into the skin once a week. Thursdays   Yes [provider]  escitalopram  (LEXAPRO ) 20 MG tablet Take 1 tablet (20 mg total) by mouth daily. 08/19/23  Yes Samtani, Jai-Gurmukh, MD  ferrous sulfate  325 (65 FE) MG tablet Take 1 tablet (325 mg total) by mouth 3 (three) times daily with meals. Patient taking differently: Take 325 mg by mouth daily with breakfast. 01/23/23  Yes Bernard Drivers, MD  insulin  lispro (HUMALOG ) 100 UNIT/ML KwikPen Inject 0-10 Units into the skin See admin instructions. Inject 0-10 units into the skin three times a day and at bedtime, PER SLIDING SCALE:  BGL 151-200 = 2 units 201-250 = 4 units 251-300 = 6 units 301-350 =  8 units 351-400 = 10 units Patient taking differently: Inject 0-10 Units into the skin 4 (four) times daily -  before meals and at bedtime. Inject 0-10 units into the skin before meals and at bedtime, PER SLIDING SCALE:  0-150 = 0 units 151-200 = 2 units 201-250 = 4 units 251-300 = 6 units 301-350 =  8 units 351-400 = 10 units 01/31/22  Yes Dahal, Chapman, MD  ipratropium-albuterol  (DUONEB) 0.5-2.5 (3) MG/3ML SOLN Take 3 mLs by nebulization every 6 (six) hours as needed (shortness of breath, wheezing).   Yes [provider]  lactulose  (CHRONULAC ) 10 GM/15ML solution Take 30 mLs (20 g total) by mouth 2 (two) times daily. 08/19/23  Yes Samtani, Jai-Gurmukh, MD  levETIRAcetam  (KEPPRA ) 500 MG tablet Take 1  tablet (500 mg total) by mouth 2 (two) times daily. 12/03/23 03/02/24 Yes Jerrol Agent, MD  lidocaine  4 % Place 1 patch onto the skin daily. Apply to knee.   Yes [provider]  magnesium  oxide (MAG-OX) 400 (240 Mg) MG tablet Take 400 mg by mouth in the morning, at noon, and at bedtime.   Yes [provider]  Meth-Hyo-M Bl-Na Phos-Ph Sal (URO-MP) 118 MG CAPS Take 118 mg by mouth 2 (two) times daily.   Yes [provider]  naloxone  (NARCAN ) 0.4 MG/ML injection Inject 0.4 mg into the vein as  needed (opioid overdose).   Yes [provider]  naloxone  (NARCAN ) nasal spray 4 mg/0.1 mL Place 1 spray into the nose See admin instructions. 1 spray alternating nostrils every 5 minutes as needed for opiate overdose.   Yes [provider]  ondansetron  (ZOFRAN ) 4 MG tablet Take 4 mg by mouth every 6 (six) hours as needed for nausea or vomiting.   Yes [provider]  OXcarbazepine  (TRILEPTAL ) 150 MG tablet Take 1 tablet (150 mg total) by mouth 2 (two) times daily. 08/19/23  Yes Samtani, Jai-Gurmukh, MD  oxyCODONE  (ROXICODONE  INTENSOL) 20 MG/ML concentrated solution Take 0.5-0.75 mLs by mouth every 6 (six) hours as needed for severe pain (pain score 7-10).   Yes [provider]  pregabalin  (LYRICA ) 200 MG capsule Take 200 mg by mouth 2 (two) times daily.   Yes [provider]  prochlorperazine  (COMPAZINE ) 5 MG tablet Take 5 mg by mouth every 8 (eight) hours as needed for nausea or vomiting.   Yes [provider]  PROTEIN PO Take 30 mLs by mouth in the morning and at bedtime. For wound healing   Yes [provider]  sodium bicarbonate  650 MG tablet Take 650 mg by mouth 2 (two) times daily.   Yes [provider]  tamsulosin  (FLOMAX ) 0.4 MG CAPS capsule Take 1 capsule (0.4 mg total) by mouth daily. 08/19/23  Yes Samtani, Jai-Gurmukh, MD    Allergies: Latex    Review of Systems  Unable to perform ROS: Mental status change    Updated Vital Signs BP (!) 171/92   Pulse 84   Temp 97.8 F (36.6 C) (Oral)   Resp 18   SpO2 95%   Physical Exam Vitals and nursing note reviewed.  Constitutional:      General: He is not in acute distress.    Comments: GSC 13 (3-4-6), somnolent but arousable to verbal stim, follows commands  HENT:     Head: Normocephalic and atraumatic.   Eyes:     Conjunctiva/sclera: Conjunctivae normal.     Pupils: Pupils are equal, round, and reactive to light.    Cardiovascular:     Rate and Rhythm:  Normal rate and regular rhythm.  Pulmonary:     Effort: Pulmonary effort is normal. No respiratory distress.  Abdominal:     General: There is no distension.     Tenderness: There is no guarding.   Musculoskeletal:        General: No deformity or signs of injury.     Cervical back: Neck supple.   Skin:    Findings: No lesion or rash.   Neurological:     Mental Status: He is alert.     Comments: Pupils reactive bilaterally, patient is postictal and status post IM Versed  and unable to fully follow commands for neurologic exam, will withdraw to painful stimuli    (all labs ordered are listed, but only  abnormal results are displayed) Labs Reviewed  COMPREHENSIVE METABOLIC PANEL WITH GFR - Abnormal; Notable for the following components:      Result Value   Glucose, Bld 161 (*)    Creatinine, Ser 1.73 (*)    Albumin  3.0 (*)    AST 14 (*)    GFR, Estimated 44 (*)    All other components within normal limits  CBC WITH DIFFERENTIAL/PLATELET - Abnormal; Notable for the following components:   WBC 12.6 (*)    Neutro Abs 10.5 (*)    All other components within normal limits  MAGNESIUM  - Abnormal; Notable for the following components:   Magnesium  1.6 (*)    All other components within normal limits  URINALYSIS, ROUTINE W REFLEX MICROSCOPIC - Abnormal; Notable for the following components:   Color, Urine STRAW (*)    Protein, ur >=300 (*)    All other components within normal limits  RAPID URINE DRUG SCREEN, HOSP PERFORMED - Abnormal; Notable for the following components:   Benzodiazepines POSITIVE (*)    All other components within normal limits  BRAIN NATRIURETIC PEPTIDE - Abnormal; Notable for the following components:   B Natriuretic Peptide 241.3 (*)    All other components within normal limits  CBG MONITORING, ED - Abnormal; Notable for the following components:   Glucose-Capillary 182 (*)    All other components within normal limits  I-STAT CHEM 8, ED - Abnormal; Notable for  the following components:   BUN 28 (*)    Creatinine, Ser 1.70 (*)    Glucose, Bld 162 (*)    All other components within normal limits  TROPONIN I (HIGH SENSITIVITY) - Abnormal; Notable for the following components:   Troponin I (High Sensitivity) 77 (*)    All other components within normal limits  RESP PANEL BY RT-PCR (RSV, FLU A&B, COVID)  [id number]  ETHANOL    EKG: EKG Interpretation Date/Time:  Tuesday December 03 2023 07:44:25 EDT Ventricular Rate:  93 PR Interval:  212 QRS Duration:  116 QT Interval:  391 QTC Calculation: 487 R Axis:   93  Text Interpretation: Sinus rhythm Atrial premature complexes Borderline prolonged PR interval Nonspecific intraventricular conduction delay Confirmed by Jerrol Agent ([id number]) on 12/03/2023 8:10:25 AM  Radiology: EEG adult Result Date: 12/03/2023 Shelton Arlin KIDD, MD     12/03/2023  1:32 PM Patient Name: Gregory [name] MRN: [id number] Epilepsy Attending: Arlin KIDD Shelton Referring Physician/Provider: Vanessa Robert, MD Date: 12/03/2023 Duration: 25.03 mins Patient history: 63 y.o. 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 EEG to evaluate for seizure Level of alertness: Awake, asleep AEDs during EEG study: LEV, Ativan  Technical aspects: This EEG study was done with scalp electrodes positioned according to the 10-20 International system of electrode placement. Electrical activity was reviewed with band pass filter of 1-70Hz , sensitivity of 7 uV/mm, display speed of 33mm/sec with a 60Hz  notched filter applied as appropriate. EEG data were recorded continuously and digitally stored.  Video monitoring was available and reviewed as appropriate. Description: The posterior dominant rhythm consists of 7.5 Hz activity of moderate voltage (25-35 uV) seen predominantly in posterior head regions, symmetric and reactive  to eye opening and eye closing. Sleep was characterized by vertex waves, sleep spindles (12 to 14 Hz), maximal frontocentral region. EEG showed continuous 3 to 6 Hz theta-delta slowing in right posterior quadrant. Hyperventilation and photic stimulation  were not performed.   ABNORMALITY - Continuous slow, right posterior quadrant. IMPRESSION: This study is suggestive of cortical dysfunction arising from right posterior quadrant likely secondary to underlying structural abnormality. No seizures or epileptiform discharges were seen throughout the recording. Arlin MALVA Krebs   DG Chest Portable 1 View Result Date: 12/03/2023 CLINICAL DATA:  New onset seizure EXAM: PORTABLE CHEST 1 VIEW COMPARISON:  Chest radiograph 09/25/2023 FINDINGS: Anterior and posterior cervical fusion. Stable cardiac silhouette. New fine airspace disease within LEFT and RIGHT lung. No focal consolidation. No pneumothorax. No fracture identified IMPRESSION: Fine bilateral airspace disease suggests pulmonary edema. No focal infiltrate. Electronically Signed   By: Jackquline Boxer M.D.   On: 12/03/2023 09:53     Procedures   Medications Ordered in the ED  levETIRAcetam  (KEPPRA ) undiluted injection 2,000 mg (2,000 mg Intravenous Given 12/03/23 0921)  sodium chloride  0.9 % bolus 1,000 mL (0 mLs Intravenous Stopped 12/03/23 1130)  magnesium  sulfate IVPB 2 g 50 mL (0 g Intravenous Stopped 12/03/23 1209)  LORazepam  (ATIVAN ) tablet 0.5 mg (0.5 mg Oral Given 12/03/23 1748)                                    Medical Decision Making Amount and/or Complexity of Data Reviewed Labs: ordered. Radiology: ordered.  Risk Prescription drug management.    63 year old male with extensive past medical history to include hypertension, osteomyelitis status post AKA, MRSA bacteremia, COPD, epidural abscess, malignant melanoma, DM2, squamous cell carcinoma, CKD, HLD, CHF (diastolic), GERD, CVA, ischemic colon status post total abdominal colectomy  with end ileostomy, presenting to the emergency department after a new onset seizure.  The patient presents from [facility] with witnessed generalized shaking behavior that lasted for around 4 minutes.  On EMS arrival the patient had a CBG of 156.  Was administered 5 mg of Versed  IM with subsequent resolution of seizure-like activity.  The patient is amnestic to the events.  He arrives somnolent but arousable to verbal stimuli. CBG 182 on arrival.  The patient reportedly has no history of seizure disorder, does have a history of hyponatremia. Unclear last known normal per EMS.   On arrival, the was afebrile, not tachycardic heart rate 75, BP 155/77, saturating 97% on room air, respiratory rate 15.  Patient with some weakness on exam in the left upper extremity after initially being not fully compliant to further evaluate.  CT Head: IMPRESSION:  1. No acute intracranial abnormality or acute traumatic injury  identified.  2. 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   Per daughter, last normal at 0650, no significant weakness prior to this AM despite old stroke. CODE STROKE called.  The patient's daughter, does have a hx of melanoma, did have recurrence. Had mets removed from eyebrow. Had also in right cheek. Had not pursued care.  Neurology arrived bedside.  Recommended MRI of the brain and EEG to further evaluate.  Laboratory evaluation revealed CBC with a nonspecific leukocytosis to 12.6, CMP with an AKI creatinine of 1.73 from baseline from 1.3, magnesium  hypomagnesemic to 1.6, replenished IV.  Last echocardiogram revealed normal EF, patient ministered a 1 L IV fluid bolus for his AKI.  CXR: IMPRESSION:  Fine bilateral airspace disease suggests pulmonary edema. No focal  infiltrate.   Patient's blood pressure did climb while in the emergency department.  He did not have an oxygen requirement and was  in no respiratory  distress.  CT Head: IMPRESSION:  1. No acute intracranial abnormality or acute traumatic injury  identified.  2. 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   The patient was unable to lie flat for MRI.  EEG was performed with neurology.  Cardiac troponin elevated at 77 and BNP elevated at 241.  The patient was hypertensive raising concern for developing hypertensive emergency however on further discussion on recommendation for admission, the patient strongly declined admission at this time.  He has no chest pain or shortness of breath.  He does have an AKI as well.  I strongly encouraged admission for better blood pressure control, further workup of the patient's new onset seizure with MRI imaging as he was unable to obtain MRI in the emergency department setting.  The patient declined this workup.  He appears to have capacity to make that decision, is currently GCS 15, AAO x 3.  Plan at time of signout to likely reengaged the patient regarding need for admission.  I did call the patient's daughter and provided her with an update regarding the likely plan for the patient to leave AGAINST MEDICAL ADVICE.  A prescription for Keppra  was sent to the patient's pharmacy on file and a referral to neurology was placed per neurology recommendations.  Signout given to Dr. Tegeler at 1530.      Final diagnoses:  Seizure (HCC)  AKI (acute kidney injury) (HCC)  Hypertension, unspecified type    ED Discharge Orders          Ordered    levETIRAcetam  (KEPPRA ) 500 MG tablet  2 times daily        12/03/23 1456    Ambulatory referral to Neurology       Comments: An appointment is requested in approximately: 4 weeks   12/03/23 1456               Jerrol Agent, MD 12/05/23 [phone]

## 2023-12-03 NOTE — ED Notes (Signed)
 Provider spoke to patient about admission. Pt refusing I want to go home Attempting to get labs and patient refused Pt continues to yell doctor

## 2023-12-04 ENCOUNTER — Ambulatory Visit: Admitting: Podiatry

## 2023-12-13 ENCOUNTER — Encounter: Payer: Self-pay | Admitting: Podiatry

## 2023-12-13 ENCOUNTER — Ambulatory Visit (INDEPENDENT_AMBULATORY_CARE_PROVIDER_SITE_OTHER)

## 2023-12-13 ENCOUNTER — Ambulatory Visit (INDEPENDENT_AMBULATORY_CARE_PROVIDER_SITE_OTHER): Admitting: Podiatry

## 2023-12-13 DIAGNOSIS — M79672 Pain in left foot: Secondary | ICD-10-CM

## 2023-12-13 DIAGNOSIS — M7752 Other enthesopathy of left foot: Secondary | ICD-10-CM

## 2023-12-14 NOTE — Progress Notes (Signed)
 Subjective:   Patient ID: Gregory [name], male   DOB: 63 y.o.   MRN: [id number]   HPI Patient states that he is lost his right leg he has severe pain in his left foot with swelling and is not sure if the diabetic shoe could be of help to him.  Patient smokes a half a pack per day is not active at all confined to wheelchair   Review of Systems  All other systems reviewed and are negative.       Objective:    Patient is in very poor health neurovascular status is reduced severe swelling of his left foot and left lower leg with very very poor health overall.  Patient does not have any ulcers on the foot he has no breaks of tissue but it is in very poor condition      Assessment:  Very difficult condition for this patient due to the intensity of his swelling with nonweightbearing status and the discomfort that is generalized but due to swelling     Plan:  H&P reviewed condition at great length and at this point I did not see where I could be of help to him.  I do not see diabetic issues being of any value given the extreme swelling of the foot that would make it impossible to fit and at this point any kind of discomfort pain I do not think we can deal with this I think it is related to swelling.  He is overall doing okay and I have encouraged him to just do daily care that they provide at the center he lives  X-rays do not indicate any fractures of the foot or other pathology extreme soft tissue edema surrounding the foot

## 2023-12-15 ENCOUNTER — Telehealth (HOSPITAL_BASED_OUTPATIENT_CLINIC_OR_DEPARTMENT_OTHER): Payer: Self-pay | Admitting: Emergency Medicine

## 2023-12-15 DIAGNOSIS — R569 Unspecified convulsions: Secondary | ICD-10-CM

## 2023-12-15 NOTE — Telephone Encounter (Signed)
 Referral changed to [facility] as the patient had previously been established there.

## 2023-12-24 ENCOUNTER — Other Ambulatory Visit: Payer: Self-pay

## 2023-12-24 ENCOUNTER — Emergency Department (HOSPITAL_COMMUNITY)

## 2023-12-24 ENCOUNTER — Inpatient Hospital Stay (HOSPITAL_COMMUNITY)
Admission: EM | Admit: 2023-12-24 | Discharge: 2023-12-27 | DRG: 564 | Disposition: A | Discharge status: Skilled Nursing Facility | Source: Skilled Nursing Facility | Attending: Internal Medicine | Admitting: Internal Medicine

## 2023-12-24 ENCOUNTER — Encounter (HOSPITAL_COMMUNITY): Payer: Self-pay | Admitting: Hospitalist

## 2023-12-24 DIAGNOSIS — I130 Hypertensive heart and chronic kidney disease with heart failure and stage 1 through stage 4 chronic kidney disease, or unspecified chronic kidney disease: Secondary | ICD-10-CM | POA: Diagnosis present

## 2023-12-24 DIAGNOSIS — E66813 Obesity, class 3: Secondary | ICD-10-CM | POA: Diagnosis present

## 2023-12-24 DIAGNOSIS — J449 Chronic obstructive pulmonary disease, unspecified: Secondary | ICD-10-CM | POA: Diagnosis present

## 2023-12-24 DIAGNOSIS — A419 Sepsis, unspecified organism: Principal | ICD-10-CM

## 2023-12-24 DIAGNOSIS — I5032 Chronic diastolic (congestive) heart failure: Secondary | ICD-10-CM | POA: Diagnosis present

## 2023-12-24 DIAGNOSIS — G9341 Metabolic encephalopathy: Secondary | ICD-10-CM | POA: Diagnosis present

## 2023-12-24 DIAGNOSIS — G894 Chronic pain syndrome: Secondary | ICD-10-CM | POA: Diagnosis present

## 2023-12-24 DIAGNOSIS — Z1152 Encounter for screening for COVID-19: Secondary | ICD-10-CM

## 2023-12-24 DIAGNOSIS — E871 Hypo-osmolality and hyponatremia: Secondary | ICD-10-CM | POA: Diagnosis present

## 2023-12-24 DIAGNOSIS — Z89611 Acquired absence of right leg above knee: Secondary | ICD-10-CM

## 2023-12-24 DIAGNOSIS — M549 Dorsalgia, unspecified: Secondary | ICD-10-CM | POA: Diagnosis present

## 2023-12-24 DIAGNOSIS — G546 Phantom limb syndrome with pain: Secondary | ICD-10-CM | POA: Diagnosis present

## 2023-12-24 DIAGNOSIS — Z8042 Family history of malignant neoplasm of prostate: Secondary | ICD-10-CM

## 2023-12-24 DIAGNOSIS — B951 Streptococcus, group B, as the cause of diseases classified elsewhere: Secondary | ICD-10-CM | POA: Diagnosis present

## 2023-12-24 DIAGNOSIS — Z6841 Body Mass Index (BMI) 40.0 and over, adult: Secondary | ICD-10-CM

## 2023-12-24 DIAGNOSIS — R7881 Bacteremia: Secondary | ICD-10-CM | POA: Diagnosis present

## 2023-12-24 DIAGNOSIS — Z8616 Personal history of COVID-19: Secondary | ICD-10-CM

## 2023-12-24 DIAGNOSIS — Z85828 Personal history of other malignant neoplasm of skin: Secondary | ICD-10-CM

## 2023-12-24 DIAGNOSIS — N179 Acute kidney failure, unspecified: Secondary | ICD-10-CM | POA: Diagnosis present

## 2023-12-24 DIAGNOSIS — L97529 Non-pressure chronic ulcer of other part of left foot with unspecified severity: Secondary | ICD-10-CM | POA: Diagnosis present

## 2023-12-24 DIAGNOSIS — Z7982 Long term (current) use of aspirin: Secondary | ICD-10-CM

## 2023-12-24 DIAGNOSIS — B955 Unspecified streptococcus as the cause of diseases classified elsewhere: Secondary | ICD-10-CM | POA: Diagnosis present

## 2023-12-24 DIAGNOSIS — R0602 Shortness of breath: Secondary | ICD-10-CM | POA: Diagnosis present

## 2023-12-24 DIAGNOSIS — R0609 Other forms of dyspnea: Secondary | ICD-10-CM | POA: Diagnosis not present

## 2023-12-24 DIAGNOSIS — F17210 Nicotine dependence, cigarettes, uncomplicated: Secondary | ICD-10-CM | POA: Diagnosis present

## 2023-12-24 DIAGNOSIS — Z85820 Personal history of malignant melanoma of skin: Secondary | ICD-10-CM

## 2023-12-24 DIAGNOSIS — T8743 Infection of amputation stump, right lower extremity: Principal | ICD-10-CM | POA: Diagnosis present

## 2023-12-24 DIAGNOSIS — Z87440 Personal history of urinary (tract) infections: Secondary | ICD-10-CM

## 2023-12-24 DIAGNOSIS — N400 Enlarged prostate without lower urinary tract symptoms: Secondary | ICD-10-CM | POA: Diagnosis present

## 2023-12-24 DIAGNOSIS — Z79899 Other long term (current) drug therapy: Secondary | ICD-10-CM

## 2023-12-24 DIAGNOSIS — G9340 Encephalopathy, unspecified: Secondary | ICD-10-CM | POA: Diagnosis not present

## 2023-12-24 DIAGNOSIS — Z794 Long term (current) use of insulin: Secondary | ICD-10-CM

## 2023-12-24 DIAGNOSIS — L89312 Pressure ulcer of right buttock, stage 2: Secondary | ICD-10-CM | POA: Diagnosis present

## 2023-12-24 DIAGNOSIS — Z8249 Family history of ischemic heart disease and other diseases of the circulatory system: Secondary | ICD-10-CM

## 2023-12-24 DIAGNOSIS — J9601 Acute respiratory failure with hypoxia: Secondary | ICD-10-CM | POA: Diagnosis present

## 2023-12-24 DIAGNOSIS — Z833 Family history of diabetes mellitus: Secondary | ICD-10-CM

## 2023-12-24 DIAGNOSIS — E1122 Type 2 diabetes mellitus with diabetic chronic kidney disease: Secondary | ICD-10-CM | POA: Diagnosis present

## 2023-12-24 DIAGNOSIS — Z8673 Personal history of transient ischemic attack (TIA), and cerebral infarction without residual deficits: Secondary | ICD-10-CM

## 2023-12-24 DIAGNOSIS — N1831 Chronic kidney disease, stage 3a: Secondary | ICD-10-CM | POA: Diagnosis present

## 2023-12-24 DIAGNOSIS — Z91040 Latex allergy status: Secondary | ICD-10-CM

## 2023-12-24 DIAGNOSIS — M8639 Chronic multifocal osteomyelitis, multiple sites: Secondary | ICD-10-CM | POA: Diagnosis present

## 2023-12-24 DIAGNOSIS — Z8614 Personal history of Methicillin resistant Staphylococcus aureus infection: Secondary | ICD-10-CM

## 2023-12-24 DIAGNOSIS — E785 Hyperlipidemia, unspecified: Secondary | ICD-10-CM | POA: Diagnosis present

## 2023-12-24 DIAGNOSIS — Y835 Amputation of limb(s) as the cause of abnormal reaction of the patient, or of later complication, without mention of misadventure at the time of the procedure: Secondary | ICD-10-CM | POA: Diagnosis present

## 2023-12-24 DIAGNOSIS — M79604 Pain in right leg: Secondary | ICD-10-CM | POA: Diagnosis present

## 2023-12-24 DIAGNOSIS — E11621 Type 2 diabetes mellitus with foot ulcer: Secondary | ICD-10-CM | POA: Diagnosis present

## 2023-12-24 DIAGNOSIS — K219 Gastro-esophageal reflux disease without esophagitis: Secondary | ICD-10-CM | POA: Diagnosis present

## 2023-12-24 DIAGNOSIS — I890 Lymphedema, not elsewhere classified: Secondary | ICD-10-CM | POA: Diagnosis present

## 2023-12-24 DIAGNOSIS — Z7985 Long-term (current) use of injectable non-insulin antidiabetic drugs: Secondary | ICD-10-CM

## 2023-12-24 DIAGNOSIS — Z933 Colostomy status: Secondary | ICD-10-CM | POA: Diagnosis not present

## 2023-12-24 DIAGNOSIS — N319 Neuromuscular dysfunction of bladder, unspecified: Secondary | ICD-10-CM | POA: Diagnosis present

## 2023-12-24 DIAGNOSIS — G9009 Other idiopathic peripheral autonomic neuropathy: Secondary | ICD-10-CM | POA: Diagnosis present

## 2023-12-24 LAB — BLOOD CULTURE ID PANEL (REFLEXED) - BCID2

## 2023-12-24 LAB — I-STAT VENOUS BLOOD GAS, ED
Acid-base deficit: 4 mmol/L — ABNORMAL HIGH (ref 0.0–2.0)
Bicarbonate: 20 mmol/L (ref 20.0–28.0)
Calcium, Ion: 1.07 mmol/L — ABNORMAL LOW (ref 1.15–1.40)
HCT: 35 % — ABNORMAL LOW (ref 39.0–52.0)
Hemoglobin: 11.9 g/dL — ABNORMAL LOW (ref 13.0–17.0)
O2 Saturation: 91 %
Potassium: 3.5 mmol/L (ref 3.5–5.1)
Sodium: 130 mmol/L — ABNORMAL LOW (ref 135–145)
TCO2: 21 mmol/L — ABNORMAL LOW (ref 22–32)
pCO2, Ven: 34.1 mmHg — ABNORMAL LOW (ref 44–60)
pH, Ven: 7.377 (ref 7.25–7.43)
pO2, Ven: 62 mmHg — ABNORMAL HIGH (ref 32–45)

## 2023-12-24 LAB — URINALYSIS, W/ REFLEX TO CULTURE (INFECTION SUSPECTED)
Bilirubin Urine: NEGATIVE
Glucose, UA: NEGATIVE mg/dL
Ketones, ur: NEGATIVE mg/dL
Leukocytes,Ua: NEGATIVE
Nitrite: NEGATIVE
Protein, ur: >=300 mg/dL — AB
Specific Gravity, Urine: 1.011 (ref 1.005–1.030)
pH: 6 (ref 5.0–8.0)

## 2023-12-24 LAB — COMPREHENSIVE METABOLIC PANEL WITH GFR
ALT: 10 U/L (ref 0–44)
AST: 24 U/L (ref 15–41)
Albumin: 2.5 g/dL — ABNORMAL LOW (ref 3.5–5.0)
Alkaline Phosphatase: 95 U/L (ref 38–126)
Anion gap: 11 (ref 5–15)
BUN: 18 mg/dL (ref 8–23)
CO2: 19 mmol/L — ABNORMAL LOW (ref 22–32)
Calcium: 8.1 mg/dL — ABNORMAL LOW (ref 8.9–10.3)
Chloride: 97 mmol/L — ABNORMAL LOW (ref 98–111)
Creatinine, Ser: 1.91 mg/dL — ABNORMAL HIGH (ref 0.61–1.24)
GFR, Estimated: 39 mL/min — ABNORMAL LOW (ref >60)
Glucose, Bld: 147 mg/dL — ABNORMAL HIGH (ref 70–99)
Potassium: 3.8 mmol/L (ref 3.5–5.1)
Sodium: 127 mmol/L — ABNORMAL LOW (ref 135–145)
Total Bilirubin: 0.8 mg/dL (ref 0.0–1.2)
Total Protein: 6.4 g/dL — ABNORMAL LOW (ref 6.5–8.1)

## 2023-12-24 LAB — PROTIME-INR
INR: >10 (ref 0.8–1.2)
INR: 1.1 (ref 0.8–1.2)
Prothrombin Time: >90 s — ABNORMAL HIGH (ref 11.4–15.2)
Prothrombin Time: 14.9 s (ref 11.4–15.2)

## 2023-12-24 LAB — CBC WITH DIFFERENTIAL/PLATELET
Abs Immature Granulocytes: 0.05 K/uL (ref 0.00–0.07)
Basophils Absolute: 0 K/uL (ref 0.0–0.1)
Basophils Relative: 0 %
Eosinophils Absolute: 0 K/uL (ref 0.0–0.5)
Eosinophils Relative: 0 %
HCT: 34.6 % — ABNORMAL LOW (ref 39.0–52.0)
Hemoglobin: 11.7 g/dL — ABNORMAL LOW (ref 13.0–17.0)
Immature Granulocytes: 0 %
Lymphocytes Relative: 3 %
Lymphs Abs: 0.4 K/uL — ABNORMAL LOW (ref 0.7–4.0)
MCH: 29.2 pg (ref 26.0–34.0)
MCHC: 33.8 g/dL (ref 30.0–36.0)
MCV: 86.3 fL (ref 80.0–100.0)
Monocytes Absolute: 0.4 K/uL (ref 0.1–1.0)
Monocytes Relative: 3 %
Neutro Abs: 10.3 K/uL — ABNORMAL HIGH (ref 1.7–7.7)
Neutrophils Relative %: 94 %
Platelets: 142 K/uL — ABNORMAL LOW (ref 150–400)
RBC: 4.01 MIL/uL — ABNORMAL LOW (ref 4.22–5.81)
RDW: 14.3 % (ref 11.5–15.5)
WBC: 11.2 K/uL — ABNORMAL HIGH (ref 4.0–10.5)
nRBC: 0 % (ref 0.0–0.2)

## 2023-12-24 LAB — I-STAT CG4 LACTIC ACID, ED
Lactic Acid, Venous: 1 mmol/L (ref 0.5–1.9)
Lactic Acid, Venous: 0.5 mmol/L (ref 0.5–1.9)

## 2023-12-24 LAB — RESP PANEL BY RT-PCR (RSV, FLU A&B, COVID)  RVPGX2
Influenza A by PCR: NEGATIVE
Influenza B by PCR: NEGATIVE
Resp Syncytial Virus by PCR: NEGATIVE
SARS Coronavirus 2 by RT PCR: NEGATIVE

## 2023-12-24 LAB — BRAIN NATRIURETIC PEPTIDE: B Natriuretic Peptide: 95.2 pg/mL (ref 0.0–100.0)

## 2023-12-24 LAB — MRSA NEXT GEN BY PCR, NASAL: MRSA by PCR Next Gen: NOT DETECTED

## 2023-12-24 MED ORDER — ORAL CARE MOUTH RINSE: 15.0000 mL | OROMUCOSAL | Status: DC | PRN

## 2023-12-24 MED ORDER — BUSPIRONE HCL 10 MG PO TABS: 5.0000 mg | ORAL_TABLET | Freq: Two times a day (BID) | ORAL | Status: DC

## 2023-12-24 MED ORDER — ATORVASTATIN CALCIUM 80 MG PO TABS
80.0000 mg | ORAL_TABLET | Freq: Every day | ORAL | Status: DC
  Administered 2023-12-25 – 2023-12-27 (×3): 80 mg via ORAL
  Filled 2023-12-24 (×3): qty 1

## 2023-12-24 MED ORDER — TAMSULOSIN HCL 0.4 MG PO CAPS
0.4000 mg | ORAL_CAPSULE | Freq: Every day | ORAL | Status: DC
  Administered 2023-12-24 – 2023-12-27 (×4): 0.4 mg via ORAL
  Filled 2023-12-24 (×4): qty 1

## 2023-12-24 MED ORDER — OXYCODONE HCL 5 MG PO TABS
5.0000 mg | ORAL_TABLET | ORAL | Status: DC | PRN
  Administered 2023-12-24 – 2023-12-25 (×6): 5 mg via ORAL
  Filled 2023-12-24 (×5): qty 1

## 2023-12-24 MED ORDER — LACTULOSE 10 GM/15ML PO SOLN
20.0000 g | Freq: Three times a day (TID) | ORAL | Status: DC
Start: 2023-12-24 — End: 2023-12-27
  Administered 2023-12-24 – 2023-12-27 (×5): 20 g via ORAL
  Filled 2023-12-24 (×7): qty 30

## 2023-12-24 MED ORDER — IPRATROPIUM-ALBUTEROL 0.5-2.5 (3) MG/3ML IN SOLN: 3.0000 mL | Freq: Four times a day (QID) | RESPIRATORY_TRACT | Status: DC | PRN

## 2023-12-24 MED ORDER — SODIUM CHLORIDE 0.9 % IV SOLN: INTRAVENOUS | Status: DC

## 2023-12-24 MED ORDER — LEVETIRACETAM 500 MG PO TABS
500.0000 mg | ORAL_TABLET | Freq: Two times a day (BID) | ORAL | Status: DC
  Administered 2023-12-24 – 2023-12-27 (×6): 500 mg via ORAL
  Filled 2023-12-24 (×6): qty 1

## 2023-12-24 MED ORDER — ACETAMINOPHEN 500 MG PO TABS
1000.0000 mg | ORAL_TABLET | Freq: Once | ORAL | Status: AC
  Administered 2023-12-24: 1000 mg via ORAL
  Filled 2023-12-24: qty 2

## 2023-12-24 MED ORDER — SODIUM CHLORIDE 0.9 % IV SOLN
2.0000 g | Freq: Once | INTRAVENOUS | Status: AC
  Administered 2023-12-24: 2 g via INTRAVENOUS
  Filled 2023-12-24: qty 20

## 2023-12-24 MED ORDER — ESCITALOPRAM OXALATE 10 MG PO TABS
20.0000 mg | ORAL_TABLET | Freq: Every day | ORAL | Status: DC
  Administered 2023-12-24 – 2023-12-25 (×2): 20 mg via ORAL
  Filled 2023-12-24 (×2): qty 2

## 2023-12-24 MED ORDER — ASPIRIN 81 MG PO TBEC
81.0000 mg | DELAYED_RELEASE_TABLET | Freq: Every day | ORAL | Status: DC
  Administered 2023-12-25 – 2023-12-27 (×3): 81 mg via ORAL
  Filled 2023-12-24 (×3): qty 1

## 2023-12-24 MED ORDER — ENOXAPARIN SODIUM 40 MG/0.4ML IJ SOSY: 40.0000 mg | PREFILLED_SYRINGE | INTRAMUSCULAR | Status: DC

## 2023-12-24 MED ORDER — OXCARBAZEPINE 150 MG PO TABS: 150.0000 mg | ORAL_TABLET | Freq: Two times a day (BID) | ORAL | Status: DC

## 2023-12-24 MED ORDER — SODIUM CHLORIDE 0.9 % IV SOLN
2.0000 g | INTRAVENOUS | Status: DC
  Administered 2023-12-25 – 2023-12-26 (×2): 2 g via INTRAVENOUS
  Filled 2023-12-24 (×3): qty 20

## 2023-12-24 MED ORDER — ACETAMINOPHEN 500 MG PO TABS
1000.0000 mg | ORAL_TABLET | Freq: Four times a day (QID) | ORAL | Status: DC
  Administered 2023-12-24 – 2023-12-27 (×11): 1000 mg via ORAL
  Filled 2023-12-24 (×11): qty 2

## 2023-12-24 MED ORDER — ENOXAPARIN SODIUM 60 MG/0.6ML IJ SOSY
50.0000 mg | PREFILLED_SYRINGE | INTRAMUSCULAR | Status: DC
  Administered 2023-12-24 – 2023-12-26 (×2): 50 mg via SUBCUTANEOUS
  Filled 2023-12-24 (×3): qty 0.6

## 2023-12-24 MED ORDER — DOCUSATE SODIUM 100 MG PO CAPS
100.0000 mg | ORAL_CAPSULE | Freq: Two times a day (BID) | ORAL | Status: DC
Start: 2023-12-24 — End: 2023-12-27
  Administered 2023-12-24 – 2023-12-27 (×6): 100 mg via ORAL
  Filled 2023-12-24 (×6): qty 1

## 2023-12-24 MED ORDER — OXYCODONE HCL 5 MG PO TABS
10.0000 mg | ORAL_TABLET | ORAL | Status: DC | PRN
  Administered 2023-12-24: 10 mg via ORAL
  Filled 2023-12-24: qty 2

## 2023-12-24 MED ORDER — SODIUM CHLORIDE 0.9 % IV SOLN
500.0000 mg | Freq: Once | INTRAVENOUS | Status: AC
  Administered 2023-12-24: 500 mg via INTRAVENOUS
  Filled 2023-12-24: qty 5

## 2023-12-24 NOTE — Progress Notes (Signed)
 New Admission Note:   Arrival Method: Via stretcher from ED Mental Orientation:  A & O x4 - Difficult to adequately assess d/t irritability and refusal to answer questions Telemetry: Not ordered Assessment: Completed Skin:  Stage II to Right Buttock and Hard black area to left bottom of 4th and 5th toes - patient refuses for us  to measure or take pictures of wounds at this time.  His scrotum is red but blanchable. IV:  Rt FA NSL and Lt FA NSL Pain:  8/10 to LLE Tubes:  RLQ Colostomy Safety Measures: Unable to discuss with patient at this time d/t irritability and yelling at staff. Admission: Completed 5 MW Orientation: Patient has been orientated to the room, unit and staff.  Family:  Daughter, Burnard, called and updated on father's condition  Patient has his cell phone without charger and watch at the bedside.  He will not let us  put his gown on him.  He is refusing to let us  take pictures and measure his wounds.  At this time, he does not want to answer any questions.  Orders have been reviewed and implemented. Will continue to monitor the patient. Call light has been placed within reach and bed alarm has been activated.   Suzen Ice RN Phone number: [phone]

## 2023-12-24 NOTE — H&P (Addendum)
 History and Physical    Patient: Gregory [name] FMW:[id number] DOB: Sep 10, 1960 DOA: 12/24/2023 DOS: the patient was seen and examined on 12/24/2023 PCP: [name], Provider, MD  Patient coming from: Home  Chief Complaint:  Chief Complaint  Patient presents with   Respiratory Distress   HPI: Gregory [name] is a 63 y.o. 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  Pt is a poor historian and unable to provide a detailed history. From what I can gather from limited interview and review of Epic, pt became SOB this morning at 0400 when smoking a cigarette. He attempted to use his home O2 (unclear baseline; documented 4-6L per [place]) without relief; thus EMS was activated. He reports not having any of his inhalers for over a year.  In the ED, pt tachycardic and tachypneic on 4L [place]. Labs notable for Na 127-->130, Cr 1.91, and  BNP 95. CXR with vascular congestion and cardiomegaly. Pt admitted to medicine for ongoing w/u.  Review of Systems: As mentioned in the history of present illness. All other systems reviewed and are negative. 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)    Acquired absence of right leg below knee (HCC)    Acute osteomyelitis of calcaneum, right (HCC) 10/02/2016   Acute respiratory failure (HCC)    Amputation stump infection (HCC) 08/12/2018   Anemia    Basal cell carcinoma, eyelid    Cancer (HCC)    Candidiasis 08/12/2018   CHF (congestive heart failure) (HCC)    chronic diastolic    Chronic back pain    Chronic kidney disease    stage 3    Chronic multifocal osteomyelitis  (HCC)    of ankle and foot    Chronic pain syndrome    COPD (chronic obstructive pulmonary disease) (HCC)    DDD (degenerative disc disease), lumbar    Depression    major depressive disorder    Diabetes mellitus without complication (HCC)    type 2    Diabetic ulcer of toe of left foot (HCC) 10/02/2016   Difficult intubation    Difficulty in walking, not elsewhere classified    Epidural abscess 10/02/2016   Foot drop, bilateral    Foot drop, right 12/27/2015   GERD (gastroesophageal reflux disease)    Herpesviral vesicular dermatitis    History of COVID-19 03/15/2022   Hyperlipidemia    Hyperlipidemia    Hypertension    Insomnia    Malignant melanoma of other parts of face (HCC)    Melanoma (HCC)    MRSA bacteremia    Muscle weakness (generalized)    Nasal congestion    Necrosis of toe (HCC) 07/08/2018   Neuromuscular dysfunction of bladder    Osteomyelitis (HCC)    Osteomyelitis (HCC)    Osteomyelitis (HCC)    right BKA   Other idiopathic peripheral autonomic neuropathy    Retention of urine, unspecified    Squamous cell carcinoma of skin    Stroke (HCC)    Unsteadiness on feet    Urinary retention    Urinary retention    Urinary tract infection    Wears dentures    Wears glasses  Past Surgical History:  Procedure Laterality Date   AMPUTATION Left 03/29/2017   Procedure: LEFT GREAT TOE AMPUTATION AT METATARSOPHALANGEAL JOINT;  Surgeon: Harden Jerona GAILS, MD;  Location: [facility] OR;  Service: Orthopedics;  Laterality: Left;   AMPUTATION Right 03/29/2017   Procedure: RIGHT BELOW KNEE AMPUTATION;  Surgeon: Harden Jerona GAILS, MD;  Location: [facility] OR;  Service: Orthopedics;  Laterality: Right;   AMPUTATION Left 06/06/2018   Procedure: LEFT 2ND TOE AMPUTATION;  Surgeon: Harden Jerona GAILS, MD;  Location: [facility] OR;  Service: Orthopedics;  Laterality: Left;   AMPUTATION Right 11/19/2018   Procedure: AMPUTATION ABOVE KNEE;  Surgeon: Harden Jerona GAILS, MD;  Location: [facility] OR;  Service: Orthopedics;   Laterality: Right;   ANTERIOR CERVICAL CORPECTOMY N/A 11/25/2015   Procedure: ANTERIOR CERVICAL FIVE CORPECTOMY Cervical four - six fusion;  Surgeon: Gerldine Maizes, MD;  Location: [facility];  Service: Neurosurgery;  Laterality: N/A;  ANTERIOR CERVICAL FIVE CORPECTOMY Cervical four - six fusion   APPLICATION OF WOUND VAC Right 10/22/2018   Procedure: Application Of Wound Vac;  Surgeon: Harden Jerona GAILS, MD;  Location: [facility] OR;  Service: Orthopedics;  Laterality: Right;   CYSTOSCOPY WITH BIOPSY N/A 05/01/2022   Procedure: CYSTOSCOPY WITH URETHRAL BIOPSY;  Surgeon: Elisabeth Valli BIRCH, MD;  Location: [facility] ORS;  Service: Urology;  Laterality: N/A;  1 HR   CYSTOSCOPY WITH RETROGRADE URETHROGRAM N/A 04/25/2018   Procedure: CYSTOSCOPY WITH RETROGRADE URETHROGRAM/ BALLOON DILATION;  Surgeon: Ottelin, Mark, MD;  Location: [facility] ORS;  Service: Urology;  Laterality: N/A;   CYSTOSCOPY WITH URETHRAL DILATATION N/A 05/01/2022   Procedure: BALLOON DILATION WITH  OPTILUME;  Surgeon: Elisabeth Valli BIRCH, MD;  Location: [facility] ORS;  Service: Urology;  Laterality: N/A;   LAPAROTOMY N/A 12/03/2022   Procedure: EXPLORATORY LAPAROTOMY  total abdominal colectomy with end ileostomy;  Surgeon: Kinsinger, Herlene Righter, MD;  Location: [facility] OR;  Service: General;  Laterality: N/A;   MULTIPLE TOOTH EXTRACTIONS     POSTERIOR CERVICAL FUSION/FORAMINOTOMY N/A 11/29/2015   Procedure: Cervical Three-Cervical Seven Posterior Cervical Laminectomy with Fusion;  Surgeon: Gerldine Maizes, MD;  Location: [facility];  Service: Neurosurgery;  Laterality: N/A;  Cervical Three-Cervical Seven Posterior Cervical Laminectomy with Fusion   STUMP REVISION Right 10/22/2018   Procedure: REVISION RIGHT BELOW KNEE AMPUTATION;  Surgeon: Harden Jerona GAILS, MD;  Location: [facility] OR;  Service: Orthopedics;  Laterality: Right;   STUMP REVISION Right 12/05/2018   Procedure: Revision Right Above Knee Amputation;  Surgeon: Harden Jerona GAILS, MD;  Location: [facility] OR;  Service: Orthopedics;   Laterality: Right;   STUMP REVISION Right 05/29/2019   Procedure: REVISION RIGHT ABOVE KNEE AMPUTATION;  Surgeon: Harden Jerona GAILS, MD;  Location: [facility] OR;  Service: Orthopedics;  Laterality: Right;   STUMP REVISION Right 06/24/2019   Procedure: STUMP REVISION;  Surgeon: Harden Jerona GAILS, MD;  Location: [facility] OR;  Service: Orthopedics;  Laterality: Right;   Social History:  reports that he has been smoking cigarettes. He has never used smokeless tobacco. He reports that he does not drink alcohol  and does not use drugs.  Allergies  Allergen Reactions   Latex Other (See Comments)    Unknown reaction (MAR)    Family History  Problem Relation Age of Onset   Diabetes Mother    Heart disease Mother    Cancer Mother    Cancer Father    Bone cancer Father    Prostate cancer Father    Cancer Paternal Grandfather    Cancer Other  Diabetes Other     Prior to Admission medications   Medication Sig Start Date End Date Taking? Authorizing Provider  acetaminophen  (TYLENOL ) 325 MG tablet Take 2 tablets (650 mg total) by mouth every 6 (six) hours as needed for mild pain (pain score 1-3) (or Fever >/= 101). 09/08/23   Elgergawy, Brayton RAMAN, MD  aspirin  EC 81 MG tablet Take 81 mg by mouth daily.    [provider]  atorvastatin  (LIPITOR ) 80 MG tablet Take 1 tablet (80 mg total) by mouth daily. 09/22/22 01/27/24  Cindy Garnette POUR, MD  busPIRone  (BUSPAR ) 5 MG tablet Take 5 mg by mouth 2 (two) times daily.    [provider]  Cholecalciferol  25 MCG (1000 UT) capsule Take 2,000 Units by mouth daily.    [provider]  docusate sodium  (COLACE) 100 MG capsule Take 100 mg by mouth 2 (two) times daily.    [provider]  Dulaglutide  0.75 MG/0.5ML SOAJ Inject 0.75 mg into the skin once a week. Thursdays    [provider]  escitalopram  (LEXAPRO ) 20 MG tablet Take 1 tablet (20 mg total) by mouth daily. 08/19/23   Samtani, Jai-Gurmukh, MD  ferrous sulfate  325 (65 FE) MG tablet  Take 1 tablet (325 mg total) by mouth 3 (three) times daily with meals. Patient taking differently: Take 325 mg by mouth daily with breakfast. 01/23/23   Bernard Drivers, MD  insulin  lispro (HUMALOG ) 100 UNIT/ML KwikPen Inject 0-10 Units into the skin See admin instructions. Inject 0-10 units into the skin three times a day and at bedtime, PER SLIDING SCALE:  BGL 151-200 = 2 units 201-250 = 4 units 251-300 = 6 units 301-350 =  8 units 351-400 = 10 units Patient taking differently: Inject 0-10 Units into the skin 4 (four) times daily -  before meals and at bedtime. Inject 0-10 units into the skin before meals and at bedtime, PER SLIDING SCALE:  0-150 = 0 units 151-200 = 2 units 201-250 = 4 units 251-300 = 6 units 301-350 =  8 units 351-400 = 10 units 01/31/22   Dahal, Chapman, MD  ipratropium-albuterol  (DUONEB) 0.5-2.5 (3) MG/3ML SOLN Take 3 mLs by nebulization every 6 (six) hours as needed (shortness of breath, wheezing).    [provider]  lactulose  (CHRONULAC ) 10 GM/15ML solution Take 30 mLs (20 g total) by mouth 2 (two) times daily. 08/19/23   Samtani, Jai-Gurmukh, MD  levETIRAcetam  (KEPPRA ) 500 MG tablet Take 1 tablet (500 mg total) by mouth 2 (two) times daily. 12/03/23 03/02/24  Jerrol Agent, MD  lidocaine  4 % Place 1 patch onto the skin daily. Apply to knee.    [provider]  magnesium  oxide (MAG-OX) 400 (240 Mg) MG tablet Take 400 mg by mouth in the morning, at noon, and at bedtime.    [provider]  Meth-Hyo-M Bl-Na Phos-Ph Sal (URO-MP) 118 MG CAPS Take 118 mg by mouth 2 (two) times daily.    [provider]  naloxone  (NARCAN ) 0.4 MG/ML injection Inject 0.4 mg into the vein as needed (opioid overdose).    [provider]  naloxone  (NARCAN ) nasal spray 4 mg/0.1 mL Place 1 spray into the nose See admin instructions. 1 spray alternating nostrils every 5 minutes as needed for opiate overdose.    [provider]  ondansetron  (ZOFRAN ) 4 MG  tablet Take 4 mg by mouth every 6 (six) hours as needed for nausea or vomiting.    [provider]  OXcarbazepine  (TRILEPTAL ) 150  MG tablet Take 1 tablet (150 mg total) by mouth 2 (two) times daily. 08/19/23   Samtani, Jai-Gurmukh, MD  oxyCODONE  (ROXICODONE  INTENSOL) 20 MG/ML concentrated solution Take 0.5-0.75 mLs by mouth every 6 (six) hours as needed for severe pain (pain score 7-10).    [provider]  pregabalin  (LYRICA ) 200 MG capsule Take 200 mg by mouth 2 (two) times daily.    [provider]  prochlorperazine  (COMPAZINE ) 5 MG tablet Take 5 mg by mouth every 8 (eight) hours as needed for nausea or vomiting.    [provider]  PROTEIN PO Take 30 mLs by mouth in the morning and at bedtime. For wound healing    [provider]  sodium bicarbonate  650 MG tablet Take 650 mg by mouth 2 (two) times daily.    [provider]  tamsulosin  (FLOMAX ) 0.4 MG CAPS capsule Take 1 capsule (0.4 mg total) by mouth daily. 08/19/23   Samtani, Jai-Gurmukh, MD    Physical Exam: Vitals:   12/24/23 1130 12/24/23 1145 12/24/23 1200 12/24/23 1505  BP: 110/62 110/64 117/68   Pulse: 88 88 86 91  Resp: (!) 29 (!) 29 (!) 31 (!) 29  Temp:      TempSrc:      SpO2: 99% 99% 98% 98%  Weight:      Height:       General: Alert, oriented x3, resting comfortably in no acute distress Respiratory: Bibasilar rales; no wheezing Cardiovascular: Regular rate and rhythm w/o m/r/g   Data Reviewed:  Lab Results  Component Value Date   WBC 11.2 (H) 12/24/2023   HGB 11.9 (L) 12/24/2023   HCT 35.0 (L) 12/24/2023   MCV 86.3 12/24/2023   PLT 142 (L) 12/24/2023   Lab Results  Component Value Date   GLUCOSE 147 (H) 12/24/2023   CALCIUM  8.1 (L) 12/24/2023   NA 130 (L) 12/24/2023   K 3.5 12/24/2023   CO2 19 (L) 12/24/2023   CL 97 (L) 12/24/2023   BUN 18 12/24/2023   CREATININE 1.91 (H) 12/24/2023   Lab Results  Component Value Date   ALT 10 12/24/2023   AST 24  12/24/2023   ALKPHOS 95 12/24/2023   BILITOT 0.8 12/24/2023   Lab Results  Component Value Date   INR 1.1 12/24/2023   INR >10.0 (HH) 12/24/2023   INR 1.0 09/25/2023    Radiology: [facility] Chest Port 1 View Result Date: 12/24/2023 CLINICAL DATA:  Shortness of breath with respiratory distress. EXAM: PORTABLE CHEST 1 VIEW COMPARISON:  12/03/2023 FINDINGS: The cardio pericardial silhouette is enlarged. Diffuse interstitial and basilar alveolar opacity is superimposed on vascular congestion in likely reflects edema. No substantial pleural effusion. No acute bony abnormality. Telemetry leads overlie the chest. IMPRESSION: Enlargement of the cardiopericardial silhouette with vascular congestion and diffuse interstitial and basilar alveolar opacity, likely edema. Electronically Signed   By: Camellia Candle M.D.   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  Acute encephalopathy Related to hypoxia and opioid overuse; high suspicion for polypharmacy of psychiatric meds contributing to pt presentation as well -HOLD pta buspar  and Trileptal  -Continue lexapro  20mg  daiyl and keppra  500mg  BID for now -Resume  pta oxycodone  at reduced dose 5mg  q4h (pta 10-15mg  q4h prn)  AHRF Presumed flash pulmonary edema BNP falsely depressed in this obese male; CXR with vascular congestion -Defer abx for now; low suspicion for CAP -IV lasix  40mg  BID for now; goal net neg 1-2L/d; strict I/Os; daily standing weights; K>4/Mg>2 -Continue lactulose  TID -Duonebs prn -Wean O2 as tolerated -Ambulatory pulse ox prior to d/c  H/o COPD Low suspicion for  AECOPD -Duonebs prn -OP pulm f/u at discharge  H/o BPH -PTA tamsulosin   Stage II pressure wound to right buttock -Wound Care RN consulted; apprec eval/recs   Advance Care Planning:   Code Status: Full Code   Consults: N/A  Family Communication: N/A  Severity of Illness: The appropriate patient status for this patient is INPATIENT. Inpatient status is judged to be reasonable and necessary in order to provide the required intensity of service to ensure the patient's safety. The patient's presenting symptoms, physical exam findings, and initial radiographic and laboratory data in the context of their chronic comorbidities is felt to place them at high risk for further clinical deterioration. Furthermore, it is not anticipated that the patient will be medically stable for discharge from the hospital within 2 midnights of admission.   * I certify that at the point of admission it is my clinical judgment that the patient will require inpatient hospital care spanning beyond 2 midnights from the point of admission due to high intensity of service, high risk for further deterioration and high frequency of surveillance required.*   ------- I spent 55 minutes reviewing previous labs/notes, obtaining separate history at the bedside, counseling/discussing the treatment plan outlined above, ordering medications/tests, and performing clinical documentation.  Author: Marsha Ada, MD 12/24/2023 3:44 PM  For on call review [email or web address].

## 2023-12-24 NOTE — ED Provider Notes (Signed)
 [place] EMERGENCY DEPARTMENT AT [facility] Provider Note  CSN: [id number] Arrival date & time: 12/24/23 9381  Chief Complaint(s) Respiratory Distress   HPI Gregory [name] is a 63 y.o. male with a past medical history listed below including COPD not on supplemental oxygen, chronic diastolic heart failure, diabetes here from skilled nursing facility for shortness of breath that came on in the last several hours.  Patient was noted to be satting 60% on room air upon EMS arrival and required CPAP.  Patient given 1 nitroglycerin due to possible CHF exacerbation and elevated blood pressures.  HPI  Past Medical History 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    Chronic pain syndrome    COPD (chronic obstructive pulmonary disease) (HCC)    DDD (degenerative disc disease), lumbar    Depression    major depressive disorder    Diabetes mellitus without complication (HCC)    type 2    Diabetic ulcer of toe of left foot (HCC) 10/02/2016   Difficult intubation    Difficulty in walking, not elsewhere classified    Epidural abscess 10/02/2016   Foot drop, bilateral    Foot drop, right 12/27/2015   GERD (gastroesophageal reflux disease)    Herpesviral vesicular dermatitis    History of COVID-19 03/15/2022   Hyperlipidemia    Hyperlipidemia    Hypertension    Insomnia    Malignant melanoma of other parts of face (HCC)    Melanoma (HCC)    MRSA bacteremia    Muscle weakness  (generalized)    Nasal congestion    Necrosis of toe (HCC) 07/08/2018   Neuromuscular dysfunction of bladder    Osteomyelitis (HCC)    Osteomyelitis (HCC)    Osteomyelitis (HCC)    right BKA   Other idiopathic peripheral autonomic neuropathy    Retention of urine, unspecified    Squamous cell carcinoma of skin    Stroke (HCC)    Unsteadiness on feet    Urinary retention    Urinary retention    Urinary tract infection    Wears dentures    Wears glasses    Patient Active Problem List   Diagnosis Date Noted   SIRS (systemic inflammatory response syndrome) (HCC) 09/26/2023   Chronic kidney disease, stage 3a (HCC) 09/26/2023   History of CVA (cerebrovascular accident) 09/26/2023   Acute metabolic encephalopathy 09/02/2023   Agitation 08/03/2023   Persistent hyperactive delirium due to multiple etiologies 08/02/2023   Malnutrition of moderate degree 07/26/2023   Acute encephalopathy 07/16/2023   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) 07/03/2023   Fever 06/28/2023   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   Altered mental status 02/08/2023   Abscess of abdominal cavity (HCC) 02/08/2023   Protein-calorie malnutrition, severe 12/19/2022   Occipital stroke (HCC) 11/27/2022   Skin ulcer of left ankle, limited to breakdown  of skin (HCC) 09/19/2022   Stroke-like symptom 09/18/2022   Diabetic ulcer of lower leg (HCC) 09/18/2022   Constipation 09/18/2022   Shortness of breath 09/07/2022   Anemia of chronic disease 09/05/2022   Chronic hypoxic respiratory failure (HCC) 09/05/2022   Respiratory failure (HCC) 08/22/2022   Pressure injury of skin 08/22/2022   Influenza A 08/22/2022   Sepsis (HCC) 08/22/2022   Pneumonia 05/01/2022   Right upper lobe pneumonia 09/05/2021   Hyponatremia 09/05/2021   Anxiety 09/05/2021   BPH (benign prostatic hyperplasia) 09/05/2021   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   Asymptomatic bacteriuria 06/30/2021    Normocytic anemia 06/29/2021   Pain of amputation stump of right lower extremity (HCC) 03/15/2021   Melanoma of cheek (HCC) 10/15/2019   Acute hypoxic respiratory failure (HCC) 07/11/2019   Acute respiratory disease due to COVID-19 virus 07/11/2019   Wound infection 06/22/2019   Abscess 06/22/2019   Chronic kidney disease, stage 3b (HCC) 05/23/2019   Tobacco abuse 05/23/2019   Cellulitis 05/23/2019   S/P AKA (above knee amputation) unilateral, right (HCC) 12/05/2018   Amputation stump infection (HCC)    Lymphedema of left leg    Diabetic polyneuropathy associated with type 2 diabetes mellitus (HCC)    Above knee amputation of right lower extremity (HCC) 10/22/2018   Dehiscence of amputation stump (HCC)    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   Candidiasis 08/12/2018   Necrosis of toe (HCC) 07/08/2018   Amputated toe of left foot (HCC) 06/18/2018   Streptococcal infection    Pressure injury, stage 3 (HCC) 06/06/2018   Cutaneous abscess of left foot    Moderate protein-calorie malnutrition (HCC)    Cellulitis of left lower extremity    Septic arthritis (HCC) 06/03/2018   Idiopathic chronic venous hypertension of left lower extremity with ulcer and inflammation (HCC) 11/14/2017   Great toe amputation status, left 11/14/2017   Enlarged lymph node in neck 07/18/2017   Malignant melanoma of face (HCC) 04/16/2017   Osteomyelitis (HCC) 03/25/2017   Skin cancer 03/25/2017   Mood disorder (HCC) 11/05/2016   Diabetic ulcer of toe of left foot (HCC) 10/02/2016   Epidural abscess 10/02/2016   Chronic pain 09/27/2016   DM2 (diabetes mellitus, type 2) (HCC) 06/06/2016   GERD without esophagitis 05/11/2016   Hypertensive heart disease with congestive heart failure (HCC) 12/15/2015   Spinal stenosis in cervical region 12/15/2015   Type II diabetes mellitus with neurological manifestations (HCC) 12/15/2015   Chronic diastolic HF (heart failure) (HCC)    Pulmonary  hypertension (HCC)    HTN (hypertension)    HLD (hyperlipidemia)    Urinary retention    Diskitis    Foot drop, bilateral    Sepsis with acute renal failure without septic shock (HCC)    COPD (chronic obstructive pulmonary disease) (HCC) 10/03/2015   Acute osteomyelitis of left foot (HCC) 10/03/2015   MRSA bacteremia 08/13/2015   Chronic low back pain 08/13/2015   Chronic multifocal osteomyelitis, multiple sites (HCC) 08/13/2015   Community acquired pneumonia 08/13/2015   Encephalopathy 03/25/2012   Home Medication(s) Prior to Admission medications   Medication Sig Start Date End Date Taking? Authorizing Provider  acetaminophen  (TYLENOL ) 325 MG tablet Take 2 tablets (650 mg total) by mouth every 6 (six) hours as needed for mild pain (pain score 1-3) (or Fever >/= 101). 09/08/23   Elgergawy, Brayton RAMAN, MD  aspirin  EC 81 MG tablet Take 81 mg by mouth daily.    [provider]  atorvastatin  (LIPITOR ) 80 MG tablet Take 1 tablet (80 mg total) by mouth daily. 09/22/22 01/27/24  Cindy Garnette POUR, MD  busPIRone  (BUSPAR ) 5 MG tablet Take 5 mg by mouth 2 (two) times daily.    [provider]  Cholecalciferol  25 MCG (1000 UT) capsule Take 2,000 Units by mouth daily.    [provider]  docusate sodium  (COLACE) 100 MG capsule Take 100 mg by mouth 2 (two) times daily.    [provider]  Dulaglutide  0.75 MG/0.5ML SOAJ Inject 0.75 mg into the skin once a week. Thursdays    [provider]  escitalopram  (LEXAPRO ) 20 MG tablet Take 1 tablet (20 mg total) by mouth daily. 08/19/23   Samtani, Jai-Gurmukh, MD  ferrous sulfate  325 (65 FE) MG tablet Take 1 tablet (325 mg total) by mouth 3 (three) times daily with meals. Patient taking differently: Take 325 mg by mouth daily with breakfast. 01/23/23   Bernard Drivers, MD  insulin  lispro (HUMALOG ) 100 UNIT/ML KwikPen Inject 0-10 Units into the skin See admin instructions. Inject 0-10 units into the skin three times a day and at  bedtime, PER SLIDING SCALE:  BGL 151-200 = 2 units 201-250 = 4 units 251-300 = 6 units 301-350 =  8 units 351-400 = 10 units Patient taking differently: Inject 0-10 Units into the skin 4 (four) times daily -  before meals and at bedtime. Inject 0-10 units into the skin before meals and at bedtime, PER SLIDING SCALE:  0-150 = 0 units 151-200 = 2 units 201-250 = 4 units 251-300 = 6 units 301-350 =  8 units 351-400 = 10 units 01/31/22   Dahal, Chapman, MD  ipratropium-albuterol  (DUONEB) 0.5-2.5 (3) MG/3ML SOLN Take 3 mLs by nebulization every 6 (six) hours as needed (shortness of breath, wheezing).    [provider]  lactulose  (CHRONULAC ) 10 GM/15ML solution Take 30 mLs (20 g total) by mouth 2 (two) times daily. 08/19/23   Samtani, Jai-Gurmukh, MD  levETIRAcetam  (KEPPRA ) 500 MG tablet Take 1 tablet (500 mg total) by mouth 2 (two) times daily. 12/03/23 03/02/24  Jerrol Agent, MD  lidocaine  4 % Place 1 patch onto the skin daily. Apply to knee.    [provider]  magnesium  oxide (MAG-OX) 400 (240 Mg) MG tablet Take 400 mg by mouth in the morning, at noon, and at bedtime.    [provider]  Meth-Hyo-M Bl-Na Phos-Ph Sal (URO-MP) 118 MG CAPS Take 118 mg by mouth 2 (two) times daily.    [provider]  naloxone  (NARCAN ) 0.4 MG/ML injection Inject 0.4 mg into the vein as needed (opioid overdose).    [provider]  naloxone  (NARCAN ) nasal spray 4 mg/0.1 mL Place 1 spray into the nose See admin instructions. 1 spray alternating nostrils every 5 minutes as needed for opiate overdose.    [provider]  ondansetron  (ZOFRAN ) 4 MG tablet Take 4 mg by mouth every 6 (six) hours as needed for nausea or vomiting.    [provider]  OXcarbazepine  (TRILEPTAL ) 150 MG tablet Take 1 tablet (150 mg total) by mouth 2 (two) times daily. 08/19/23   Samtani, Jai-Gurmukh, MD  oxyCODONE  (ROXICODONE  INTENSOL) 20 MG/ML concentrated solution Take 0.5-0.75 mLs by  mouth every 6 (six) hours as needed for severe pain (pain score 7-10).    [provider]  pregabalin  (LYRICA ) 200 MG capsule Take 200 mg by mouth 2 (two) times daily.    [provider]  prochlorperazine  (COMPAZINE ) 5 MG  tablet Take 5 mg by mouth every 8 (eight) hours as needed for nausea or vomiting.    [provider]  PROTEIN PO Take 30 mLs by mouth in the morning and at bedtime. For wound healing    [provider]  sodium bicarbonate  650 MG tablet Take 650 mg by mouth 2 (two) times daily.    [provider]  tamsulosin  (FLOMAX ) 0.4 MG CAPS capsule Take 1 capsule (0.4 mg total) by mouth daily. 08/19/23   Samtani, Jai-Gurmukh, MD                                                                                                                                    Allergies Latex  Review of Systems Review of Systems As noted in HPI  Physical Exam Vital Signs  I have reviewed the triage vital signs BP 125/68   Pulse (!) 111   Temp (!) 101.1 F (38.4 C) (Rectal)   Resp (!) 29   Ht 5' 5 (1.651 m)   Wt 111.1 kg   SpO2 99%   BMI 40.77 kg/m   Physical Exam Vitals reviewed.  Constitutional:      General: He is not in acute distress.    Appearance: He is well-developed. He is not diaphoretic.  HENT:     Head: Normocephalic and atraumatic.     Nose: Nose normal.  Eyes:     General: No scleral icterus.       Right eye: No discharge.        Left eye: No discharge.     Conjunctiva/sclera: Conjunctivae normal.     Pupils: Pupils are equal, round, and reactive to light.  Cardiovascular:     Rate and Rhythm: Regular rhythm. Tachycardia present.     Heart sounds: No murmur heard.    No friction rub. No gallop.  Pulmonary:     Effort: Tachypnea and respiratory distress present.     Breath sounds: No stridor. Examination of the right-middle field reveals rales. Examination of the left-middle field reveals rales. Examination of the right-lower  field reveals rales. Examination of the left-lower field reveals rales. Rales present.  Abdominal:     General: There is no distension.     Palpations: Abdomen is soft.     Tenderness: There is no abdominal tenderness.   Musculoskeletal:        General: No tenderness.     Cervical back: Normal range of motion and neck supple.     Left lower leg: 2+ Pitting Edema present.     Right Lower Extremity: Right leg is amputated above knee.  Skin:    General: Skin is warm and dry.     Findings: No erythema or rash.  Neurological:     Mental Status: He is alert and oriented to person, place, and time.     ED Results and Treatments Labs (all labs ordered are listed, but only  abnormal results are displayed) Labs Reviewed  CBC WITH DIFFERENTIAL/PLATELET - Abnormal; Notable for the following components:      Result Value   WBC 11.2 (*)    RBC 4.01 (*)    Hemoglobin 11.7 (*)    HCT 34.6 (*)    Platelets 142 (*)    Neutro Abs 10.3 (*)    Lymphs Abs 0.4 (*)    All other components within normal limits  RESP PANEL BY RT-PCR (RSV, FLU A&B, COVID)  [id number]  CULTURE, BLOOD (ROUTINE X 2)  CULTURE, BLOOD (ROUTINE X 2)  COMPREHENSIVE METABOLIC PANEL WITH GFR  BRAIN NATRIURETIC PEPTIDE  PROTIME-INR  URINALYSIS, W/ REFLEX TO CULTURE (INFECTION SUSPECTED)  I-STAT VENOUS BLOOD GAS, ED  I-STAT CG4 LACTIC ACID, ED                                                                                                                         EKG  EKG Interpretation Date/Time:  Tuesday December 24 2023 06:25:08 EDT Ventricular Rate:  120 PR Interval:  142 QRS Duration:  105 QT Interval:  396 QTC Calculation: 560 R Axis:   103  Text Interpretation: Sinus tachycardia Right axis deviation Prolonged QT interval Confirmed by Trine Likes [phone]) on 12/24/2023 6:47:08 AM       Radiology DG Chest Port 1 View Result Date: 12/24/2023 CLINICAL DATA:  Shortness of breath with respiratory distress. EXAM: PORTABLE  CHEST 1 VIEW COMPARISON:  12/03/2023 FINDINGS: The cardio pericardial silhouette is enlarged. Diffuse interstitial and basilar alveolar opacity is superimposed on vascular congestion in likely reflects edema. No substantial pleural effusion. No acute bony abnormality. Telemetry leads overlie the chest. IMPRESSION: Enlargement of the cardiopericardial silhouette with vascular congestion and diffuse interstitial and basilar alveolar opacity, likely edema. Electronically Signed   By: Camellia Candle M.D.   On: 12/24/2023 07:14    Medications Ordered in ED Medications  0.9 %  sodium chloride  infusion ( Intravenous New Bag/Given 12/24/23 0701)  azithromycin  (ZITHROMAX ) 500 mg in sodium chloride  0.9 % 250 mL IVPB (500 mg Intravenous New Bag/Given 12/24/23 0738)  cefTRIAXone  (ROCEPHIN ) 2 g in sodium chloride  0.9 % 100 mL IVPB (0 g Intravenous Stopped 12/24/23 0738)  acetaminophen  (TYLENOL ) tablet 1,000 mg (1,000 mg Oral Given 12/24/23 0728)   Procedures .1-3 Lead EKG Interpretation  Performed by: Trine Likes Moder, MD Authorized by: Trine Likes Moder, MD     Interpretation: abnormal     ECG rate:  121   ECG rate assessment: tachycardic     Rhythm: sinus rhythm     Ectopy: none     Conduction: normal   .Critical Care  Performed by: Trine Likes Moder, MD Authorized by: Trine Likes Moder, MD   Critical care provider statement:    Critical care time (minutes):  30   Critical care time was exclusive of:  Separately billable procedures and treating other patients   Critical care was necessary to treat or prevent imminent or life-threatening deterioration  of the following conditions:  Sepsis and respiratory failure   Critical care was time spent personally by me on the following activities:  Development of treatment plan with patient or surrogate, discussions with consultants, evaluation of patient's response to treatment, examination of patient, obtaining history from patient or surrogate,  review of old charts, re-evaluation of patient's condition, pulse oximetry, ordering and review of radiographic studies, ordering and review of laboratory studies and ordering and performing treatments and interventions   (including critical care time) Medical Decision Making / ED Course   Medical Decision Making Amount and/or Complexity of Data Reviewed Labs: ordered. Decision-making details documented in ED Course. Radiology: ordered and independent interpretation performed. Decision-making details documented in ED Course. ECG/medicine tests: ordered and independent interpretation performed. Decision-making details documented in ED Course.  Risk OTC drugs. Prescription drug management. Decision regarding hospitalization.    Respiratory distress differential diagnosis considered.  Placed on BiPAP upon arrival.  Patient does appear to be volume overloaded on exam concern for CHF exacerbation.  Also considering COPD exacerbation.  Patient is warm to touch and considering infectious etiology.  Rectal exam confirmed temperature of 101.1.  Code sepsis initiated and patient started on empiric antibiotics for respiratory source.   Clinical Course as of 12/24/23 0741  Tue Dec 24, 2023  0738 CBC with Differential/Platelet(!) CBC with leukocytosis.  Mild anemia [PC]  0738 I-Stat CG4 Lactic Acid Within normal limits [PC]  0738 DG Chest Port 1 View Notable for cardiomegaly with pulmonary vascular congestion and evidence of pulmonary edema.  Possible superimposed infection. [PC]  [id number] Patient care turned over to oncoming provider. Patient case and results discussed in detail; please see their note for further ED managment.     [PC]    Clinical Course User Index [PC] Gemma Ruan, Raynell Moder, MD    Final Clinical Impression(s) / ED Diagnoses Final diagnoses:  Sepsis with acute hypoxic respiratory failure without septic shock, due to unspecified organism [facility])    This chart was  dictated using voice recognition software.  Despite best efforts to proofread,  errors can occur which can change the documentation meaning.    Trine Raynell Moder, MD 12/24/23 [phone]

## 2023-12-24 NOTE — Consult Note (Signed)
 WOC team consulted for R buttock wound and L 3rd and 4th toe wounds.  Secure chat to primary team requesting photos for consult.    Please note that the [facility] nursing team is utilizing a standardized work plan to manage patient consults. We are triaging consults and will try to see the patients within 48 hours. Wound photos in the patient's chart allow us  to consult on the patient in the most efficient and timely manner.    Thank you,    Powell Bar MSN, RN-BC, [employer]

## 2023-12-24 NOTE — ED Triage Notes (Signed)
 Patient arrived with EMS from [facility] , staff reported increasing SOB /chest congestion / Rales onset last night , place on CPAP by EMS  , he received 1NTG sl prior to arrival . CBG=100. History of CHF/COPD . Full Code .

## 2023-12-24 NOTE — Progress Notes (Signed)
 Patient wearing oxygen set at 3lpm with Sp02=98%, patient does not appear to be in any respiratory distress at this time. Bipap not needed

## 2023-12-24 NOTE — Sepsis Progress Note (Signed)
 Sepsis protocol monitored by eLink ?

## 2023-12-24 NOTE — ED Notes (Signed)
 Please calll Gregory [name] patients daughter with an update contact info in chart

## 2023-12-24 NOTE — Progress Notes (Signed)
 PHARMACY - PHYSICIAN COMMUNICATION CRITICAL VALUE ALERT - BLOOD CULTURE IDENTIFICATION (BCID)  Gregory [name] is an 63 y.o. male who presented to [facility] on 12/24/2023 with a chief complaint of respiratory distress  Assessment:  Pt with group B strep bacteremia  Name of physician (or Provider) Contacted: Dr. Alfornia  Current antibiotics: None currently - received 2gm Rocephin  in ED this a.m.  Changes to prescribed antibiotics recommended:  Rocephin  2gm IV q24h  Results for orders placed or performed during the hospital encounter of 12/24/23  Blood Culture ID Panel (Reflexed) (Collected: 12/24/2023  6:57 AM)  Result Value Ref Range   Enterococcus faecalis NOT DETECTED NOT DETECTED   Enterococcus Faecium NOT DETECTED NOT DETECTED   Listeria monocytogenes NOT DETECTED NOT DETECTED   Staphylococcus species NOT DETECTED NOT DETECTED   Staphylococcus aureus (BCID) NOT DETECTED NOT DETECTED   Staphylococcus epidermidis NOT DETECTED NOT DETECTED   Staphylococcus lugdunensis NOT DETECTED NOT DETECTED   Streptococcus species DETECTED (A) NOT DETECTED   Streptococcus agalactiae DETECTED (A) NOT DETECTED   Streptococcus pneumoniae NOT DETECTED NOT DETECTED   Streptococcus pyogenes NOT DETECTED NOT DETECTED   A.calcoaceticus-baumannii NOT DETECTED NOT DETECTED   Bacteroides fragilis NOT DETECTED NOT DETECTED   Enterobacterales NOT DETECTED NOT DETECTED   Enterobacter cloacae complex NOT DETECTED NOT DETECTED   Escherichia coli NOT DETECTED NOT DETECTED   Klebsiella aerogenes NOT DETECTED NOT DETECTED   Klebsiella oxytoca NOT DETECTED NOT DETECTED   Klebsiella pneumoniae NOT DETECTED NOT DETECTED   Proteus species NOT DETECTED NOT DETECTED   Salmonella species NOT DETECTED NOT DETECTED   Serratia marcescens NOT DETECTED NOT DETECTED   Haemophilus influenzae NOT DETECTED NOT DETECTED   Neisseria meningitidis NOT DETECTED NOT DETECTED   Pseudomonas aeruginosa NOT DETECTED NOT DETECTED    Stenotrophomonas maltophilia NOT DETECTED NOT DETECTED   Candida albicans NOT DETECTED NOT DETECTED   Candida auris NOT DETECTED NOT DETECTED   Candida glabrata NOT DETECTED NOT DETECTED   Candida krusei NOT DETECTED NOT DETECTED   Candida parapsilosis NOT DETECTED NOT DETECTED   Candida tropicalis NOT DETECTED NOT DETECTED   Cryptococcus neoformans/gattii NOT DETECTED NOT DETECTED    Vito Ralph, PharmD, BCPS Please see amion for complete clinical pharmacist phone list 12/24/2023  8:54 PM

## 2023-12-24 NOTE — ED Provider Notes (Signed)
  Physical Exam  BP (!) 153/84 (BP Location: Right Arm)   Pulse (!) 120   Temp (!) 101.1 F (38.4 C) (Rectal)   Resp (!) 32   Ht 5' 5 (1.651 m)   Wt 111.1 kg   SpO2 100%   BMI 40.77 kg/m   Physical Exam  Procedures  Procedures  ED Course / MDM   Clinical Course as of 12/26/23 0909  Tue Dec 24, 2023  0738 CBC with Differential/Platelet(!) CBC with leukocytosis.  Mild anemia [PC]  0738 I-Stat CG4 Lactic Acid Within normal limits [PC]  0738 DG Chest Port 1 View Notable for cardiomegaly with pulmonary vascular congestion and evidence of pulmonary edema.  Possible superimposed infection. [PC]  [id number] Patient care turned over to oncoming provider. Patient case and results discussed in detail; please see their note for further ED managment.     [PC]    Clinical Course User Index [PC] Cardama, Raynell Moder, MD    Received care of patient from Dr. Trine. Please see his note for prior hx, physical and care. Dyspnea in the past few hours, hypoxic to 60s.  On BiPAP, febrile.  Prior admissions for SIRs criteria.  Given rocephin , azithromycin  for presumed CAP in setting of fever, hypoxia.  Able to wean from bipap to nasal cannula.  Labs with new hyponatremia.  Admitted for further care.       Dreama Longs, MD 12/26/23 [phone]

## 2023-12-25 ENCOUNTER — Inpatient Hospital Stay (HOSPITAL_COMMUNITY)

## 2023-12-25 DIAGNOSIS — R0609 Other forms of dyspnea: Secondary | ICD-10-CM

## 2023-12-25 DIAGNOSIS — J9601 Acute respiratory failure with hypoxia: Secondary | ICD-10-CM | POA: Diagnosis not present

## 2023-12-25 LAB — BASIC METABOLIC PANEL WITH GFR
Anion gap: 10 (ref 5–15)
BUN: 20 mg/dL (ref 8–23)
CO2: 19 mmol/L — ABNORMAL LOW (ref 22–32)
Calcium: 8.2 mg/dL — ABNORMAL LOW (ref 8.9–10.3)
Chloride: 103 mmol/L (ref 98–111)
Creatinine, Ser: 2.27 mg/dL — ABNORMAL HIGH (ref 0.61–1.24)
GFR, Estimated: 31 mL/min — ABNORMAL LOW (ref >60)
Glucose, Bld: 86 mg/dL (ref 70–99)
Potassium: 4 mmol/L (ref 3.5–5.1)
Sodium: 132 mmol/L — ABNORMAL LOW (ref 135–145)

## 2023-12-25 LAB — ECHOCARDIOGRAM COMPLETE
AR max vel: 2.54 cm2
AV Area VTI: 2.56 cm2
AV Area mean vel: 2.2 cm2
AV Mean grad: 11 mmHg
AV Peak grad: 19.5 mmHg
Ao pk vel: 2.21 m/s
Area-P 1/2: 2.76 cm2
Height: 65 in
P 1/2 time: 361 ms
S' Lateral: 4.1 cm
Weight: 3920 [oz_av]

## 2023-12-25 MED ORDER — BUSPIRONE HCL 5 MG PO TABS
5.0000 mg | ORAL_TABLET | Freq: Two times a day (BID) | ORAL | Status: DC
  Administered 2023-12-25 – 2023-12-27 (×5): 5 mg via ORAL
  Filled 2023-12-25 (×5): qty 1

## 2023-12-25 MED ORDER — ONDANSETRON 4 MG PO TBDP
4.0000 mg | ORAL_TABLET | Freq: Three times a day (TID) | ORAL | Status: DC | PRN
  Administered 2023-12-25 – 2023-12-26 (×3): 4 mg via ORAL
  Filled 2023-12-25 (×3): qty 1

## 2023-12-25 MED ORDER — OXYCODONE HCL 5 MG PO TABS
15.0000 mg | ORAL_TABLET | Freq: Four times a day (QID) | ORAL | Status: DC | PRN
  Administered 2023-12-25 – 2023-12-27 (×8): 15 mg via ORAL
  Filled 2023-12-25 (×8): qty 3

## 2023-12-25 MED ORDER — OXYCODONE HCL 5 MG PO TABS
5.0000 mg | ORAL_TABLET | Freq: Once | ORAL | Status: AC
  Filled 2023-12-25: qty 1

## 2023-12-25 MED ORDER — NALOXONE HCL 0.4 MG/ML IJ SOLN: 0.4000 mg | INTRAMUSCULAR | Status: DC | PRN

## 2023-12-25 MED ORDER — SENNA 8.6 MG PO TABS
2.0000 | ORAL_TABLET | Freq: Every day | ORAL | Status: DC
  Administered 2023-12-25 – 2023-12-26 (×2): 17.2 mg via ORAL
  Filled 2023-12-25 (×2): qty 2

## 2023-12-25 MED ORDER — OXYCODONE HCL 5 MG PO TABS: 10.0000 mg | ORAL_TABLET | ORAL | Status: DC | PRN

## 2023-12-25 MED ORDER — PANTOPRAZOLE SODIUM 40 MG PO TBEC
40.0000 mg | DELAYED_RELEASE_TABLET | Freq: Every day | ORAL | Status: DC
  Administered 2023-12-25 – 2023-12-27 (×3): 40 mg via ORAL
  Filled 2023-12-25 (×3): qty 1

## 2023-12-25 MED ORDER — MELATONIN 5 MG PO TABS
10.0000 mg | ORAL_TABLET | Freq: Every day | ORAL | Status: DC
  Administered 2023-12-25 – 2023-12-26 (×2): 10 mg via ORAL
  Filled 2023-12-25 (×2): qty 2

## 2023-12-25 MED ORDER — METHOCARBAMOL 500 MG PO TABS
500.0000 mg | ORAL_TABLET | Freq: Three times a day (TID) | ORAL | Status: DC
  Administered 2023-12-25 – 2023-12-27 (×7): 500 mg via ORAL
  Filled 2023-12-25 (×7): qty 1

## 2023-12-25 MED ORDER — PERFLUTREN LIPID MICROSPHERE
1.0000 mL | INTRAVENOUS | Status: AC | PRN
  Administered 2023-12-25: 2 mL via INTRAVENOUS

## 2023-12-25 MED ORDER — ESCITALOPRAM OXALATE 10 MG PO TABS
5.0000 mg | ORAL_TABLET | Freq: Every day | ORAL | Status: DC
  Administered 2023-12-25 – 2023-12-27 (×3): 5 mg via ORAL
  Filled 2023-12-25 (×3): qty 1

## 2023-12-25 MED ORDER — OXCARBAZEPINE 150 MG PO TABS
150.0000 mg | ORAL_TABLET | Freq: Two times a day (BID) | ORAL | Status: DC
  Administered 2023-12-25 – 2023-12-27 (×5): 150 mg via ORAL
  Filled 2023-12-25 (×7): qty 1

## 2023-12-25 MED ORDER — ADULT MULTIVITAMIN W/MINERALS CH
1.0000 | ORAL_TABLET | Freq: Every day | ORAL | Status: DC
  Administered 2023-12-25 – 2023-12-27 (×3): 1 via ORAL
  Filled 2023-12-25 (×3): qty 1

## 2023-12-25 MED ORDER — GERHARDT'S BUTT CREAM
TOPICAL_CREAM | Freq: Two times a day (BID) | CUTANEOUS | Status: DC
  Filled 2023-12-25: qty 60

## 2023-12-25 NOTE — Plan of Care (Signed)

## 2023-12-25 NOTE — Consult Note (Signed)
 WOC Nurse Consult Note: Reason for Consult: wound to buttocks and toes Wound type: moisture associated skin damage (MASD) to R/L buttocks with fissure in the superior gluteal cleft, ischemic ulcer to L 4th toe, and wound to medial aspect of  L heel Pressure Injury POA: NA Measurement:  L 4th toe: 3cm x 2cm L medial aspect of heel: 1 cm x 1cm  Wound bed: Buttocks: moisture associated irritation L 4th toe: covered with hard black eschar L medial aspect of heel: covered with hard black eschar Drainage (amount, consistency, odor) none Periwound:  Buttocks: macerated L toe: intact, previous amputation of toes. L medial aspect of heel: intact Dressing procedure/placement/frequency: Cleanse buttocks, apply Gerhardt's cream BID and PRN soiling. L toe/heel: paint would with betadine , leave open to air.   WOC team will not follow at this time, please re-consult if new needs arise.   Thank you,  Doyal Polite, RN, MSN, [facility] WOC Team

## 2023-12-25 NOTE — TOC Initial Note (Signed)
 Transition of Care [facility]) - Initial/Assessment Note    Patient Details  Name: Gregory [name] MRN: [id number] Date of Birth: Oct 19, 1960  [facility]) CM/SW Contact:    Isaiah Public, LCSWA Phone Number: 12/25/2023, 4:18 PM  Clinical Narrative:                  CSW spoke with patient. Patient reports PTA he comes from [facility]. Patient confirms plan is to return when medically ready for dc. All questions answered. No further questions reported at this time. Tammy with [facility] confirmed facility can accept patient back when he is ready. CSW will continue to follow and assist with patients dc planning needs.  Expected Discharge Plan: Skilled Nursing Facility Barriers to Discharge: Continued Medical Work up   Patient Goals and CMS Choice Patient states their goals for this hospitalization and ongoing recovery are:: SNF   Choice offered to / list presented to : Patient      Expected Discharge Plan and Services In-house Referral: Clinical Social Work     Living arrangements for the past 2 months: Skilled Nursing Facility                                      Prior Living Arrangements/Services Living arrangements for the past 2 months: Skilled Nursing Facility Lives with:: Facility Resident Patient language and need for interpreter reviewed:: Yes Do you feel safe going back to the place where you live?: Yes      Need for Family Participation in Patient Care: Yes (Comment) Care giver support system in place?: Yes (comment)   Criminal Activity/Legal Involvement Pertinent to Current Situation/Hospitalization: No - Comment as needed  Activities of Daily Living   ADL Screening (condition at time of admission) Independently performs ADLs?: Yes (appropriate for developmental age) Is the patient deaf or have difficulty hearing?: Yes Does the patient have difficulty seeing, even when wearing glasses/contacts?: No Does the patient have difficulty  concentrating, remembering, or making decisions?: No  Permission Sought/Granted Permission sought to share information with : Case Manager, Magazine features editor, Other (comment) Permission granted to share information with : Yes, Verbal Permission Granted  Share Information with NAME: [name]  Permission granted to share info w AGENCY: SNF  Permission granted to share info w Relationship: daughter  Permission granted to share info w Contact Information: [name] [phone]  Emotional Assessment Appearance:: Appears stated age Attitude/Demeanor/Rapport: Gracious Affect (typically observed): Calm Orientation: : Oriented to Self, Oriented to Place, Oriented to  Time, Oriented to Situation Alcohol  / Substance Use: Not Applicable Psych Involvement: No (comment)  Admission diagnosis:  Acute encephalopathy [G93.40] Acute hypoxemic respiratory failure (HCC) [J96.01] Sepsis with acute hypoxic respiratory failure without septic shock, due to unspecified organism (HCC) [A41.9, R65.20, J96.01] Patient Active Problem List   Diagnosis Date Noted   Acute hypoxemic respiratory failure (HCC) 12/24/2023   SIRS (systemic inflammatory response syndrome) (HCC) 09/26/2023   Chronic kidney disease, stage 3a (HCC) 09/26/2023   History of CVA (cerebrovascular accident) 09/26/2023   Acute metabolic encephalopathy 09/02/2023   Agitation 08/03/2023   Persistent hyperactive delirium due to multiple etiologies 08/02/2023   Malnutrition of moderate degree 07/26/2023   Acute encephalopathy 07/16/2023   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) 07/03/2023   Fever 06/28/2023   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   Altered mental status 02/08/2023   Abscess of  abdominal cavity (HCC) 02/08/2023   Protein-calorie malnutrition, severe 12/19/2022   Occipital stroke (HCC) 11/27/2022   Skin ulcer of left ankle, limited to breakdown of skin (HCC) 09/19/2022   Stroke-like  symptom 09/18/2022   Diabetic ulcer of lower leg (HCC) 09/18/2022   Constipation 09/18/2022   Shortness of breath 09/07/2022   Anemia of chronic disease 09/05/2022   Chronic hypoxic respiratory failure (HCC) 09/05/2022   Respiratory failure (HCC) 08/22/2022   Pressure injury of skin 08/22/2022   Influenza A 08/22/2022   Sepsis (HCC) 08/22/2022   Pneumonia 05/01/2022   Right upper lobe pneumonia 09/05/2021   Hyponatremia 09/05/2021   Anxiety 09/05/2021   BPH (benign prostatic hyperplasia) 09/05/2021   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   Asymptomatic bacteriuria 06/30/2021   Normocytic anemia 06/29/2021   Pain of amputation stump of right lower extremity (HCC) 03/15/2021   Melanoma of cheek (HCC) 10/15/2019   Acute hypoxic respiratory failure (HCC) 07/11/2019   Acute respiratory disease due to COVID-19 virus 07/11/2019   Wound infection 06/22/2019   Abscess 06/22/2019   Chronic kidney disease, stage 3b (HCC) 05/23/2019   Tobacco abuse 05/23/2019   Cellulitis 05/23/2019   S/P AKA (above knee amputation) unilateral, right (HCC) 12/05/2018   Amputation stump infection (HCC)    Lymphedema of left leg    Diabetic polyneuropathy associated with type 2 diabetes mellitus (HCC)    Above knee amputation of right lower extremity (HCC) 10/22/2018   Dehiscence of amputation stump (HCC)    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   Candidiasis 08/12/2018   Necrosis of toe (HCC) 07/08/2018   Amputated toe of left foot (HCC) 06/18/2018   Streptococcal infection    Pressure injury, stage 3 (HCC) 06/06/2018   Cutaneous abscess of left foot    Moderate protein-calorie malnutrition (HCC)    Cellulitis of left lower extremity    Septic arthritis (HCC) 06/03/2018   Idiopathic chronic venous hypertension of left lower extremity with ulcer and inflammation (HCC) 11/14/2017   Great toe amputation status, left 11/14/2017   Enlarged lymph node in neck 07/18/2017   Malignant  melanoma of face (HCC) 04/16/2017   Osteomyelitis (HCC) 03/25/2017   Skin cancer 03/25/2017   Mood disorder (HCC) 11/05/2016   Diabetic ulcer of toe of left foot (HCC) 10/02/2016   Epidural abscess 10/02/2016   Chronic pain 09/27/2016   DM2 (diabetes mellitus, type 2) (HCC) 06/06/2016   GERD without esophagitis 05/11/2016   Hypertensive heart disease with congestive heart failure (HCC) 12/15/2015   Spinal stenosis in cervical region 12/15/2015   Type II diabetes mellitus with neurological manifestations (HCC) 12/15/2015   Chronic diastolic HF (heart failure) (HCC)    Pulmonary hypertension (HCC)    HTN (hypertension)    HLD (hyperlipidemia)    Urinary retention    Diskitis    Foot drop, bilateral    Sepsis with acute renal failure without septic shock (HCC)    COPD (chronic obstructive pulmonary disease) (HCC) 10/03/2015   Acute osteomyelitis of left foot (HCC) 10/03/2015   MRSA bacteremia 08/13/2015   Chronic low back pain 08/13/2015   Chronic multifocal osteomyelitis, multiple sites (HCC) 08/13/2015   Community acquired pneumonia 08/13/2015   Encephalopathy 03/25/2012   PCP:  [name], Provider, MD Pharmacy:   [facility] [place] [facility] [place], [place] - [address] [place] [address] Phone: [phone] Fax: [phone]     Social Drivers of Health (SDOH) Social History: SDOH Screenings   Food  Insecurity: No Food Insecurity (12/24/2023)  Housing: Low Risk  (12/24/2023)  Transportation Needs: No Transportation Needs (12/24/2023)  Utilities: Not At Risk (12/24/2023)  Depression (PHQ2-9): Low Risk  (03/08/2021)  Social Connections: Socially Isolated (09/26/2023)  Tobacco Use: High Risk (12/24/2023)   SDOH Interventions:     Readmission Risk Interventions    09/04/2023    1:49 PM 09/06/2022    1:48 PM 08/27/2022   12:46 PM  Readmission Risk Prevention Plan  Transportation Screening Complete Complete Complete  Medication Review Furniture conservator/restorer) Complete Complete Complete  PCP or Specialist appointment within 3-5 days of discharge Complete Complete Complete  HRI or Home Care Consult Complete Complete Complete  SW Recovery Care/Counseling Consult Complete Complete Complete  Palliative Care Screening Not Applicable Not Applicable Not Applicable  Skilled Nursing Facility Complete Complete Complete

## 2023-12-25 NOTE — Consult Note (Signed)
 WOC Nurse Consult Note:  WOC team consult acknowledged.  WTA presented to bedside to evaluate wound, patient is irritable at time of visit, refusing to allow WTA to view wounds, states not interested in any wound care.  WTA will re-attempt later in the day.  Thank you,  Doyal Polite, RN, MSN, [facility] WOC Team

## 2023-12-25 NOTE — Progress Notes (Signed)
 PROGRESS NOTE    AZAREL BANNER  FMW:[id number] DOB: 10/16/1960 DOA: 12/24/2023 PCP: [name], Provider, MD    Brief Narrative:  63 year old gentleman with history of hypertension, hyperlipidemia, type 2 diabetes on insulin , cervical spinal stenosis, high output ileostomy, COPD, right AKA and chronic pain syndrome on multiple narcotics, history of stroke who is a long-term nursing home resident brought to the ER with confusion, could not breathe.  Patient tells me he had no other preceding symptoms, however he just could not breathe in the morning so they sent her to the ER.  In the emergency room initially tachycardic and tachypneic on 4 L oxygen.  Sodium 127.  Creatinine 1.9.  Chest x-ray with vascular congestion and cardiomegaly.  Admitted due to significant symptoms.  Subjective: Patient seen and examined in the morning rounds.  All interview was focused on his home dose of oxycodone  which is 15 mg and how the admitting doctors did not give him enough of his oxycodone .  Patient tells me that he does not use oxygen at nursing home however he tells me his breathing is back to his normal being but he is leg pain is much worse.  No other overnight events.  Remains on 2 L oxygen. Assessment & Plan:   Acute metabolic encephalopathy: Likely multifactorial.  Suspect polypharmacy.  Patient is on BuSpar , Trileptal , Lexapro , Keppra  and oxycodone  15 mg up to 4 times a day. Does not have any focal deficits.  With improved mentation, will gradually resume his home medications and monitor.  Acute hypoxic respiratory failure: Unknown baseline.  Patient probably has baseline oxygen requirement.  Chest x-ray with vascular congestion.  Patient received IV fluids overnight. Clinically euvolemic.  Will hold off further fluid or diuretics as he is euvolemic and renal functions are worsening. Repeat echocardiogram today. Bronchodilator therapy and chest physiotherapy.  AKI on CKD stage IIIa: Baseline creatinine  about 1.8-1.9.  Monitor off diuretics.  Recheck tomorrow.  BPH: On Flomax .  Stage II pressure wound of the right buttock: Wound care consultation.   DVT prophylaxis: Lovenox  subcu   Code Status: Full code Family Communication: None at bedside Disposition Plan: Status is: Inpatient Remains inpatient appropriate because: Severe systemic illness     Consultants:  None  Procedures:  None  Antimicrobials:  Ceftriaxone  7/8---     Objective: Vitals:   12/24/23 1559 12/24/23 2027 12/25/23 0428 12/25/23 0921  BP: 124/77 109/68 110/61 (!) 130/57  Pulse: 87 68 63 71  Resp: 18 14 12 14   Temp: 99.5 F (37.5 C) 98.8 F (37.1 C) 98 F (36.7 C) 97.6 F (36.4 C)  TempSrc: Oral Oral  Oral  SpO2: 96% 96% 94% 98%  Weight:      Height:        Intake/Output Summary (Last 24 hours) at 12/25/2023 1321 Last data filed at 12/25/2023 1100 Gross per 24 hour  Intake 2376.4 ml  Output 600 ml  Net 1776.4 ml   Filed Weights   12/24/23 0623  Weight: 111.1 kg    Examination:  General exam: Appears anxious but comfortable.  Holding his right above-knee amputation stump and pain. Respiratory system: Clear to auscultation. Respiratory effort normal.  Some conducted upper airway sounds.  On 2 L oxygen. Cardiovascular system: S1 & S2 heard, RRR.  Gastrointestinal system: Soft.  Nontender.  Colostomy present. Central nervous system: Alert and oriented. No focal neurological deficits. Extremities:  Generalized weakness.  No focal deficits. Multiple chronic nonhealing ulcers left foot, chronic lymphedema. Right leg with AKA  stump.  Clean and dry.    Data Reviewed: I have personally reviewed following labs and imaging studies  CBC: Recent Labs  Lab 12/24/23 0647 12/24/23 0856  WBC 11.2*  --   NEUTROABS 10.3*  --   HGB 11.7* 11.9*  HCT 34.6* 35.0*  MCV 86.3  --   PLT 142*  --    Basic Metabolic Panel: Recent Labs  Lab 12/24/23 0647 12/24/23 0856 12/25/23 0441  NA 127* 130*  132*  K 3.8 3.5 4.0  CL 97*  --  103  CO2 19*  --  19*  GLUCOSE 147*  --  86  BUN 18  --  20  CREATININE 1.91*  --  2.27*  CALCIUM  8.1*  --  8.2*   GFR: Estimated Creatinine Clearance: 37.8 mL/min (A) (by C-G formula based on SCr of 2.27 mg/dL (H)). Liver Function Tests: Recent Labs  Lab 12/24/23 0647  AST 24  ALT 10  ALKPHOS 95  BILITOT 0.8  PROT 6.4*  ALBUMIN  2.5*   No results for input(s): LIPASE, AMYLASE in the last 168 hours. No results for input(s): AMMONIA in the last 168 hours. Coagulation Profile: Recent Labs  Lab 12/24/23 0707 12/24/23 0912  INR >10.0* 1.1   Cardiac Enzymes: No results for input(s): CKTOTAL, CKMB, CKMBINDEX, TROPONINI in the last 168 hours. BNP (last 3 results) No results for input(s): PROBNP in the last 8760 hours. HbA1C: No results for input(s): HGBA1C in the last 72 hours. CBG: No results for input(s): GLUCAP in the last 168 hours. Lipid Profile: No results for input(s): CHOL, HDL, LDLCALC, TRIG, CHOLHDL, LDLDIRECT in the last 72 hours. Thyroid Function Tests: No results for input(s): TSH, T4TOTAL, FREET4, T3FREE, THYROIDAB in the last 72 hours. Anemia Panel: No results for input(s): VITAMINB12, FOLATE, FERRITIN, TIBC, IRON, RETICCTPCT in the last 72 hours. Sepsis Labs: Recent Labs  Lab 12/24/23 9347 12/24/23 0856  LATICACIDVEN 1.0 0.5    Recent Results (from the past 240 hours)  Resp panel by RT-PCR (RSV, Flu A&B, Covid) Anterior Nasal Swab     Status: None   Collection Time: 12/24/23  6:47 AM   Specimen: Anterior Nasal Swab  Result Value Ref Range Status   SARS Coronavirus 2 by RT PCR NEGATIVE NEGATIVE Final   Influenza A by PCR NEGATIVE NEGATIVE Final   Influenza B by PCR NEGATIVE NEGATIVE Final    Comment: (NOTE) The Xpert Xpress SARS-CoV-2/FLU/RSV plus assay is intended as an aid in the diagnosis of influenza from Nasopharyngeal swab specimens and should not be used  as a sole basis for treatment. Nasal washings and aspirates are unacceptable for Xpert Xpress SARS-CoV-2/FLU/RSV testing.  Fact Sheet for Patients: BloggerCourse.com  Fact Sheet for Healthcare Providers: SeriousBroker.it  This test is not yet approved or cleared by the [place]  FDA and has been authorized for detection and/or diagnosis of SARS-CoV-2 by FDA under an Emergency Use Authorization (EUA). This EUA will remain in effect (meaning this test can be used) for the duration of the COVID-19 declaration under Section 564(b)(1) of the Act, 21 U.S.C. section 360bbb-3(b)(1), unless the authorization is terminated or revoked.     Resp Syncytial Virus by PCR NEGATIVE NEGATIVE Final    Comment: (NOTE) Fact Sheet for Patients: BloggerCourse.com  Fact Sheet for Healthcare Providers: SeriousBroker.it  This test is not yet approved or cleared by the [place]  FDA and has been authorized for detection and/or diagnosis of SARS-CoV-2 by FDA under an Emergency Use Authorization (EUA). This EUA will  remain in effect (meaning this test can be used) for the duration of the COVID-19 declaration under Section 564(b)(1) of the Act, 21 U.S.C. section 360bbb-3(b)(1), unless the authorization is terminated or revoked.  Performed at [facility], [address], [place], [place] [address]   Blood Culture (routine x 2)     Status: Abnormal (Preliminary result)   Collection Time: 12/24/23  6:57 AM   Specimen: BLOOD  Result Value Ref Range Status   Specimen Description BLOOD SITE NOT SPECIFIED  Final   Special Requests   Final    BOTTLES DRAWN AEROBIC AND ANAEROBIC Blood Culture results may not be optimal due to an inadequate volume of blood received in culture bottles   Culture  Setup Time   Final    GRAM POSITIVE COCCI IN CHAINS IN BOTH AEROBIC AND ANAEROBIC BOTTLES CRITICAL  RESULT CALLED TO, READ BACK BY AND VERIFIED WITH: PHARMD C.AMEND [phone] @ 2043 FH    Culture (A)  Final    GROUP B STREP(S.AGALACTIAE)ISOLATED SUSCEPTIBILITIES TO FOLLOW Performed at [facility], [address], [place], [place] [address]    Report Status PENDING  Incomplete  Blood Culture ID Panel (Reflexed)     Status: Abnormal   Collection Time: 12/24/23  6:57 AM  Result Value Ref Range Status   Enterococcus faecalis NOT DETECTED NOT DETECTED Final   Enterococcus Faecium NOT DETECTED NOT DETECTED Final   Listeria monocytogenes NOT DETECTED NOT DETECTED Final   Staphylococcus species NOT DETECTED NOT DETECTED Final   Staphylococcus aureus (BCID) NOT DETECTED NOT DETECTED Final   Staphylococcus epidermidis NOT DETECTED NOT DETECTED Final   Staphylococcus lugdunensis NOT DETECTED NOT DETECTED Final   Streptococcus species DETECTED (A) NOT DETECTED Final    Comment: CRITICAL RESULT CALLED TO, READ BACK BY AND VERIFIED WITH: PHARMD C.AMEND [phone] @ 2043 FH    Streptococcus agalactiae DETECTED (A) NOT DETECTED Final    Comment: CRITICAL RESULT CALLED TO, READ BACK BY AND VERIFIED WITH: PHARMD C.AMEND [phone] @ 2043 FH    Streptococcus pneumoniae NOT DETECTED NOT DETECTED Final   Streptococcus pyogenes NOT DETECTED NOT DETECTED Final   A.calcoaceticus-baumannii NOT DETECTED NOT DETECTED Final   Bacteroides fragilis NOT DETECTED NOT DETECTED Final   Enterobacterales NOT DETECTED NOT DETECTED Final   Enterobacter cloacae complex NOT DETECTED NOT DETECTED Final   Escherichia coli NOT DETECTED NOT DETECTED Final   Klebsiella aerogenes NOT DETECTED NOT DETECTED Final   Klebsiella oxytoca NOT DETECTED NOT DETECTED Final   Klebsiella pneumoniae NOT DETECTED NOT DETECTED Final   Proteus species NOT DETECTED NOT DETECTED Final   Salmonella species NOT DETECTED NOT DETECTED Final   Serratia marcescens NOT DETECTED NOT DETECTED Final   Haemophilus influenzae NOT DETECTED NOT DETECTED  Final   Neisseria meningitidis NOT DETECTED NOT DETECTED Final   Pseudomonas aeruginosa NOT DETECTED NOT DETECTED Final   Stenotrophomonas maltophilia NOT DETECTED NOT DETECTED Final   Candida albicans NOT DETECTED NOT DETECTED Final   Candida auris NOT DETECTED NOT DETECTED Final   Candida glabrata NOT DETECTED NOT DETECTED Final   Candida krusei NOT DETECTED NOT DETECTED Final   Candida parapsilosis NOT DETECTED NOT DETECTED Final   Candida tropicalis NOT DETECTED NOT DETECTED Final   Cryptococcus neoformans/gattii NOT DETECTED NOT DETECTED Final    Comment: Performed at [facility], [address], [place], [place] [address]  Blood Culture (routine x 2)     Status: None (Preliminary result)   Collection  Time: 12/24/23  7:36 AM   Specimen: BLOOD LEFT FOREARM  Result Value Ref Range Status   Specimen Description BLOOD LEFT FOREARM  Final   Special Requests   Final    BOTTLES DRAWN AEROBIC AND ANAEROBIC Blood Culture results may not be optimal due to an inadequate volume of blood received in culture bottles   Culture   Final    NO GROWTH 1 DAY Performed at [facility], [address], [place], [place] [address]    Report Status PENDING  Incomplete  MRSA Next Gen by PCR, Nasal     Status: None   Collection Time: 12/24/23  3:59 PM   Specimen: Nasal Mucosa; Nasal Swab  Result Value Ref Range Status   MRSA by PCR Next Gen NOT DETECTED NOT DETECTED Final    Comment: (NOTE) The GeneXpert MRSA Assay (FDA approved for NASAL specimens only), is one component of a comprehensive MRSA colonization surveillance program. It is not intended to diagnose MRSA infection nor to guide or monitor treatment for MRSA infections. Test performance is not FDA approved in patients less than 73 years old. Performed at [facility], [address], [place], [place] [address]          Radiology Studies: DG Chest Port 1 View Result Date: 12/24/2023 CLINICAL DATA:  Shortness of  breath with respiratory distress. EXAM: PORTABLE CHEST 1 VIEW COMPARISON:  12/03/2023 FINDINGS: The cardio pericardial silhouette is enlarged. Diffuse interstitial and basilar alveolar opacity is superimposed on vascular congestion in likely reflects edema. No substantial pleural effusion. No acute bony abnormality. Telemetry leads overlie the chest. IMPRESSION: Enlargement of the cardiopericardial silhouette with vascular congestion and diffuse interstitial and basilar alveolar opacity, likely edema. Electronically Signed   By: Camellia Candle M.D.   On: 12/24/2023 07:14        Scheduled Meds:  acetaminophen   1,000 mg Oral QID   aspirin  EC  81 mg Oral Daily   atorvastatin   80 mg Oral Daily   busPIRone   5 mg Oral BID   docusate sodium   100 mg Oral BID   enoxaparin  (LOVENOX ) injection  50 mg Subcutaneous Q24H   escitalopram   5 mg Oral Daily   Gerhardt's butt cream   Topical BID   lactulose   20 g Oral TID   levETIRAcetam   500 mg Oral BID   melatonin  10 mg Oral QHS   methocarbamol   500 mg Oral TID   multivitamin with minerals  1 tablet Oral Daily   OXcarbazepine   150 mg Oral BID   pantoprazole   40 mg Oral Daily   senna  2 tablet Oral QHS   tamsulosin   0.4 mg Oral Daily   Continuous Infusions:  cefTRIAXone  (ROCEPHIN )  IV 2 g (12/25/23 0843)     LOS: 1 day    Time spent: 35 minutes    Renato Applebaum, MD [facility]

## 2023-12-25 NOTE — Progress Notes (Signed)
 Patient is on oxycodone  at home for chronic phantom pain. On admission, dose of oxycodone  was reduced to 5 mg every 4 hours PRN due to concern for polypharmacy.  Informed by RN that the patient is very upset and screaming.  He wants the dose of oxycodone  to be increased to 15 mg 4 times a day.  I have ordered a one-time dose of oxycodone  IR 5 mg.  Further adjustment per day team.

## 2023-12-26 DIAGNOSIS — R7881 Bacteremia: Secondary | ICD-10-CM

## 2023-12-26 DIAGNOSIS — G9340 Encephalopathy, unspecified: Secondary | ICD-10-CM

## 2023-12-26 DIAGNOSIS — B951 Streptococcus, group B, as the cause of diseases classified elsewhere: Secondary | ICD-10-CM

## 2023-12-26 DIAGNOSIS — J9601 Acute respiratory failure with hypoxia: Secondary | ICD-10-CM | POA: Diagnosis not present

## 2023-12-26 LAB — BASIC METABOLIC PANEL WITH GFR
Anion gap: 8 (ref 5–15)
BUN: 27 mg/dL — ABNORMAL HIGH (ref 8–23)
CO2: 23 mmol/L (ref 22–32)
Calcium: 8.1 mg/dL — ABNORMAL LOW (ref 8.9–10.3)
Chloride: 105 mmol/L (ref 98–111)
Creatinine, Ser: 2.32 mg/dL — ABNORMAL HIGH (ref 0.61–1.24)
GFR, Estimated: 31 mL/min — ABNORMAL LOW (ref >60)
Glucose, Bld: 138 mg/dL — ABNORMAL HIGH (ref 70–99)
Potassium: 3.6 mmol/L (ref 3.5–5.1)
Sodium: 136 mmol/L (ref 135–145)

## 2023-12-26 LAB — GLUCOSE, CAPILLARY: Glucose-Capillary: 113 mg/dL — ABNORMAL HIGH (ref 70–99)

## 2023-12-26 LAB — CULTURE, BLOOD (ROUTINE X 2)

## 2023-12-26 MED ORDER — INSULIN ASPART 100 UNIT/ML IJ SOLN: 0.0000 [IU] | Freq: Every day | INTRAMUSCULAR | Status: DC

## 2023-12-26 MED ORDER — SODIUM CHLORIDE 0.9 % IV SOLN: INTRAVENOUS | Status: DC

## 2023-12-26 MED ORDER — AMOXICILLIN 500 MG PO CAPS
1000.0000 mg | ORAL_CAPSULE | Freq: Three times a day (TID) | ORAL | Status: DC
  Administered 2023-12-27: 1000 mg via ORAL
  Filled 2023-12-26: qty 2

## 2023-12-26 MED ORDER — INSULIN ASPART 100 UNIT/ML IJ SOLN: 0.0000 [IU] | Freq: Three times a day (TID) | INTRAMUSCULAR | Status: DC

## 2023-12-26 MED ORDER — OXYMETAZOLINE HCL 0.05 % NA SOLN
1.0000 | Freq: Two times a day (BID) | NASAL | Status: DC
  Administered 2023-12-26 – 2023-12-27 (×3): 1 via NASAL
  Filled 2023-12-26: qty 30

## 2023-12-26 NOTE — Progress Notes (Signed)
 PROGRESS NOTE    Gregory [name]  FMW:[id number] DOB: 05-May-1961 DOA: 12/24/2023 PCP: [name], Provider, MD    Brief Narrative:  63 year old gentleman with history of hypertension, hyperlipidemia, type 2 diabetes on insulin , cervical spinal stenosis, high output ileostomy, COPD, right AKA and chronic pain syndrome on multiple narcotics, history of stroke who is a long-term nursing home resident brought to the ER with confusion, could not breathe.  Patient tells me he had no other preceding symptoms, however he just could not breathe in the morning so they sent her to the ER.  In the emergency room initially tachycardic and tachypneic on 4 L oxygen.  Sodium 127.  Creatinine 1.9.  Chest x-ray with vascular congestion and cardiomegaly.  Admitted due to significant symptoms.  Subjective: Patient seen and examined.  Denies any complaints today.  Wants to be discharged because his daughter need to take him to somewhere. Case discussed with ID, seen in consultation.  Recommending oral antibiotics for 10 days starting tomorrow.  Will continue Rocephin  today. Creatinine 2.32, slightly increased from the baseline.  Will keep on some IV fluid today.  Pain is controlled.  Assessment & Plan:   Streptococcal bacteremia: Primary source unknown.  Presented with altered mental status and shortness of breath.  He does have lymphedema of the left leg but no open source of infection.  Echocardiogram without evidence of infection.  Repeat blood cultures today.  Discussed with infectious disease.  Continue Rocephin  today.  Anticipate home on amoxicillin  if blood cultures are negative by tomorrow.  Acute metabolic encephalopathy: Likely multifactorial.  Suspect polypharmacy.  Patient is on BuSpar , Trileptal , Lexapro , Keppra  and oxycodone  15 mg up to 4 times a day. Does not have any focal deficits.  With improved mentation, resume all medications.  Acute hypoxic respiratory failure: Unknown baseline.  Patient probably  has baseline oxygen requirement.  Chest x-ray with vascular congestion.  Clinically euvolemic.  Maintenance fluid next 24 hours.  Echocardiogram fairly normal.  Bronchodilator therapy and chest physiotherapy.  AKI on CKD stage IIIa: Baseline creatinine about 1.8-1.9.  Monitor off diuretics.  Recheck tomorrow.  BPH: On Flomax .  Stage II pressure wound of the right buttock: Wound care consultation.  Complex pain management: Patient is on multiple antidepressants and opiates, gabapentin .  This is continued.   DVT prophylaxis: Lovenox  subcu   Code Status: Full code Family Communication: None at bedside Disposition Plan: Status is: Inpatient Remains inpatient appropriate because: Severe systemic illness, bacteremia     Consultants:  Infectious disease  Procedures:  None  Antimicrobials:  Ceftriaxone  7/9---     Objective: Vitals:   12/25/23 0921 12/25/23 2014 12/25/23 2017 12/26/23 0400  BP: (!) 130/57  (!) 140/72 139/71  Pulse: 71  71 74  Resp: 14 18 18 18   Temp: 97.6 F (36.4 C) 98.1 F (36.7 C) 98.2 F (36.8 C) 98.1 F (36.7 C)  TempSrc: Oral   Axillary  SpO2: 98%  96%   Weight:      Height:        Intake/Output Summary (Last 24 hours) at 12/26/2023 1248 Last data filed at 12/25/2023 2111 Gross per 24 hour  Intake --  Output 50 ml  Net -50 ml   Filed Weights   12/24/23 0623  Weight: 111.1 kg    Examination:  General exam: Pleasant interactive and comfortable today. Respiratory system: Clear to auscultation. Respiratory effort normal.  Some conducted upper airway sounds.  On 2 L oxygen. Cardiovascular system: S1 & S2 heard, RRR.  Gastrointestinal system: Soft.  Nontender.  Colostomy present. Central nervous system: Alert and oriented. No focal neurological deficits. Extremities:  Generalized weakness.  No focal deficits. Multiple chronic nonhealing ulcers left foot, chronic lymphedema. Right leg with AKA stump.  Clean and dry.    Data Reviewed: I  have personally reviewed following labs and imaging studies  CBC: Recent Labs  Lab 12/24/23 0647 12/24/23 0856  WBC 11.2*  --   NEUTROABS 10.3*  --   HGB 11.7* 11.9*  HCT 34.6* 35.0*  MCV 86.3  --   PLT 142*  --    Basic Metabolic Panel: Recent Labs  Lab 12/24/23 0647 12/24/23 0856 12/25/23 0441 12/26/23 0538  NA 127* 130* 132* 136  K 3.8 3.5 4.0 3.6  CL 97*  --  103 105  CO2 19*  --  19* 23  GLUCOSE 147*  --  86 138*  BUN 18  --  20 27*  CREATININE 1.91*  --  2.27* 2.32*  CALCIUM  8.1*  --  8.2* 8.1*   GFR: Estimated Creatinine Clearance: 37 mL/min (A) (by C-G formula based on SCr of 2.32 mg/dL (H)). Liver Function Tests: Recent Labs  Lab 12/24/23 0647  AST 24  ALT 10  ALKPHOS 95  BILITOT 0.8  PROT 6.4*  ALBUMIN  2.5*   No results for input(s): LIPASE, AMYLASE in the last 168 hours. No results for input(s): AMMONIA in the last 168 hours. Coagulation Profile: Recent Labs  Lab 12/24/23 0707 12/24/23 0912  INR >10.0* 1.1   Cardiac Enzymes: No results for input(s): CKTOTAL, CKMB, CKMBINDEX, TROPONINI in the last 168 hours. BNP (last 3 results) No results for input(s): PROBNP in the last 8760 hours. HbA1C: No results for input(s): HGBA1C in the last 72 hours. CBG: No results for input(s): GLUCAP in the last 168 hours. Lipid Profile: No results for input(s): CHOL, HDL, LDLCALC, TRIG, CHOLHDL, LDLDIRECT in the last 72 hours. Thyroid Function Tests: No results for input(s): TSH, T4TOTAL, FREET4, T3FREE, THYROIDAB in the last 72 hours. Anemia Panel: No results for input(s): VITAMINB12, FOLATE, FERRITIN, TIBC, IRON, RETICCTPCT in the last 72 hours. Sepsis Labs: Recent Labs  Lab 12/24/23 9347 12/24/23 0856  LATICACIDVEN 1.0 0.5    Recent Results (from the past 240 hours)  Resp panel by RT-PCR (RSV, Flu A&B, Covid) Anterior Nasal Swab     Status: None   Collection Time: 12/24/23  6:47 AM   Specimen:  Anterior Nasal Swab  Result Value Ref Range Status   SARS Coronavirus 2 by RT PCR NEGATIVE NEGATIVE Final   Influenza A by PCR NEGATIVE NEGATIVE Final   Influenza B by PCR NEGATIVE NEGATIVE Final    Comment: (NOTE) The Xpert Xpress SARS-CoV-2/FLU/RSV plus assay is intended as an aid in the diagnosis of influenza from Nasopharyngeal swab specimens and should not be used as a sole basis for treatment. Nasal washings and aspirates are unacceptable for Xpert Xpress SARS-CoV-2/FLU/RSV testing.  Fact Sheet for Patients: BloggerCourse.com  Fact Sheet for Healthcare Providers: SeriousBroker.it  This test is not yet approved or cleared by the [place]  FDA and has been authorized for detection and/or diagnosis of SARS-CoV-2 by FDA under an Emergency Use Authorization (EUA). This EUA will remain in effect (meaning this test can be used) for the duration of the COVID-19 declaration under Section 564(b)(1) of the Act, 21 U.S.C. section 360bbb-3(b)(1), unless the authorization is terminated or revoked.     Resp Syncytial Virus by PCR NEGATIVE NEGATIVE Final    Comment: (  NOTE) Fact Sheet for Patients: BloggerCourse.com  Fact Sheet for Healthcare Providers: SeriousBroker.it  This test is not yet approved or cleared by the [place]  FDA and has been authorized for detection and/or diagnosis of SARS-CoV-2 by FDA under an Emergency Use Authorization (EUA). This EUA will remain in effect (meaning this test can be used) for the duration of the COVID-19 declaration under Section 564(b)(1) of the Act, 21 U.S.C. section 360bbb-3(b)(1), unless the authorization is terminated or revoked.  Performed at [facility], [address] [place], [place] [address]   Blood Culture (routine x 2)     Status: Abnormal   Collection Time: 12/24/23  6:57 AM   Specimen: BLOOD  Result Value Ref  Range Status   Specimen Description BLOOD SITE NOT SPECIFIED  Final   Special Requests   Final    BOTTLES DRAWN AEROBIC AND ANAEROBIC Blood Culture results may not be optimal due to an inadequate volume of blood received in culture bottles   Culture  Setup Time   Final    GRAM POSITIVE COCCI IN CHAINS IN BOTH AEROBIC AND ANAEROBIC BOTTLES CRITICAL RESULT CALLED TO, READ BACK BY AND VERIFIED WITH: PHARMD C.AMEND [phone] @ 2043 FH Performed at [facility], [address] [place], [place] [address]    Culture GROUP B STREP(S.AGALACTIAE)ISOLATED (A)  Final   Report Status 12/26/2023 FINAL  Final   Organism ID, Bacteria GROUP B STREP(S.AGALACTIAE)ISOLATED  Final      Susceptibility   Group b strep(s.agalactiae)isolated - MIC*    CLINDAMYCIN  >=1 RESISTANT Resistant     AMPICILLIN  <=0.25 SENSITIVE Sensitive     ERYTHROMYCIN >=8 RESISTANT Resistant     VANCOMYCIN  0.5 SENSITIVE Sensitive     CEFTRIAXONE  <=0.12 SENSITIVE Sensitive     LEVOFLOXACIN  1 SENSITIVE Sensitive     PENICILLIN  <=0.06 SENSITIVE Sensitive     * GROUP B STREP(S.AGALACTIAE)ISOLATED  Blood Culture ID Panel (Reflexed)     Status: Abnormal   Collection Time: 12/24/23  6:57 AM  Result Value Ref Range Status   Enterococcus faecalis NOT DETECTED NOT DETECTED Final   Enterococcus Faecium NOT DETECTED NOT DETECTED Final   Listeria monocytogenes NOT DETECTED NOT DETECTED Final   Staphylococcus species NOT DETECTED NOT DETECTED Final   Staphylococcus aureus (BCID) NOT DETECTED NOT DETECTED Final   Staphylococcus epidermidis NOT DETECTED NOT DETECTED Final   Staphylococcus lugdunensis NOT DETECTED NOT DETECTED Final   Streptococcus species DETECTED (A) NOT DETECTED Final    Comment: CRITICAL RESULT CALLED TO, READ BACK BY AND VERIFIED WITH: PHARMD C.AMEND [phone] @ 2043 FH    Streptococcus agalactiae DETECTED (A) NOT DETECTED Final    Comment: CRITICAL RESULT CALLED TO, READ BACK BY AND VERIFIED WITH: PHARMD C.AMEND [phone]  @ 2043 FH    Streptococcus pneumoniae NOT DETECTED NOT DETECTED Final   Streptococcus pyogenes NOT DETECTED NOT DETECTED Final   A.calcoaceticus-baumannii NOT DETECTED NOT DETECTED Final   Bacteroides fragilis NOT DETECTED NOT DETECTED Final   Enterobacterales NOT DETECTED NOT DETECTED Final   Enterobacter cloacae complex NOT DETECTED NOT DETECTED Final   Escherichia coli NOT DETECTED NOT DETECTED Final   Klebsiella aerogenes NOT DETECTED NOT DETECTED Final   Klebsiella oxytoca NOT DETECTED NOT DETECTED Final   Klebsiella pneumoniae NOT DETECTED NOT DETECTED Final   Proteus species NOT DETECTED NOT DETECTED Final   Salmonella species NOT DETECTED NOT DETECTED Final   Serratia marcescens NOT DETECTED NOT DETECTED Final   Haemophilus influenzae NOT DETECTED NOT  DETECTED Final   Neisseria meningitidis NOT DETECTED NOT DETECTED Final   Pseudomonas aeruginosa NOT DETECTED NOT DETECTED Final   Stenotrophomonas maltophilia NOT DETECTED NOT DETECTED Final   Candida albicans NOT DETECTED NOT DETECTED Final   Candida auris NOT DETECTED NOT DETECTED Final   Candida glabrata NOT DETECTED NOT DETECTED Final   Candida krusei NOT DETECTED NOT DETECTED Final   Candida parapsilosis NOT DETECTED NOT DETECTED Final   Candida tropicalis NOT DETECTED NOT DETECTED Final   Cryptococcus neoformans/gattii NOT DETECTED NOT DETECTED Final    Comment: Performed at [facility], [address], [name], [place] [address]  Blood Culture (routine x 2)     Status: None (Preliminary result)   Collection Time: 12/24/23  7:36 AM   Specimen: BLOOD LEFT FOREARM  Result Value Ref Range Status   Specimen Description BLOOD LEFT FOREARM  Final   Special Requests   Final    BOTTLES DRAWN AEROBIC AND ANAEROBIC Blood Culture results may not be optimal due to an inadequate volume of blood received in culture bottles   Culture   Final    NO GROWTH 2 DAYS Performed at [facility], [address], [place],  [place] [address]    Report Status PENDING  Incomplete  MRSA Next Gen by PCR, Nasal     Status: None   Collection Time: 12/24/23  3:59 PM   Specimen: Nasal Mucosa; Nasal Swab  Result Value Ref Range Status   MRSA by PCR Next Gen NOT DETECTED NOT DETECTED Final    Comment: (NOTE) The GeneXpert MRSA Assay (FDA approved for NASAL specimens only), is one component of a comprehensive MRSA colonization surveillance program. It is not intended to diagnose MRSA infection nor to guide or monitor treatment for MRSA infections. Test performance is not FDA approved in patients less than 65 years old. Performed at [facility], [address] [place], [place] [address]          Radiology Studies: ECHOCARDIOGRAM COMPLETE Result Date: 12/25/2023    ECHOCARDIOGRAM REPORT   Patient Name:   Gregory [name] Date of Exam: 12/25/2023 Medical Rec #:  [id number]    Height:       65.0 in Accession #:    [id number]   Weight:       245.0 lb Date of Birth:  September 23, 1960   BSA:          2.156 m Patient Age:    62 years     BP:           130/57 mmHg Patient Gender: M            HR:           64 bpm. Exam Location:  Inpatient Procedure: 2D Echo, Cardiac Doppler, Color Doppler and Intracardiac            Opacification Agent (Both Spectral and Color Flow Doppler were            utilized during procedure). Indications:    Dyspnea  History:        Patient has prior history of Echocardiogram examinations. Risk                 Factors:Hypertension, Diabetes and Dyslipidemia.  Sonographer:    Christiana Mbomeh Referring Phys: [id number] RENATO APPLEBAUM IMPRESSIONS  1. Mild hypokinesis of the distal septum/apex; overall low normal LV function.  2. Left ventricular ejection fraction, by estimation, is 50 to 55%. The  left ventricle has low normal function. The left ventricle demonstrates regional wall motion abnormalities (see scoring diagram/findings for description). The left ventricular internal cavity size was mildly dilated. There is mild  left ventricular hypertrophy. Left ventricular diastolic parameters are consistent with Grade I diastolic dysfunction (impaired relaxation).  3. Right ventricular systolic function is normal. The right ventricular size is mildly enlarged.  4. The mitral valve is normal in structure. No evidence of mitral valve regurgitation. No evidence of mitral stenosis.  5. The aortic valve is tricuspid. Aortic valve regurgitation is mild. Aortic valve sclerosis/calcification is present, without any evidence of aortic stenosis.  6. The inferior vena cava is normal in size with greater than 50% respiratory variability, suggesting right atrial pressure of 3 mmHg. FINDINGS  Left Ventricle: Left ventricular ejection fraction, by estimation, is 50 to 55%. The left ventricle has low normal function. The left ventricle demonstrates regional wall motion abnormalities. Definity  contrast agent was given IV to delineate the left ventricular endocardial borders. The left ventricular internal cavity size was mildly dilated. There is mild left ventricular hypertrophy. Left ventricular diastolic parameters are consistent with Grade I diastolic dysfunction (impaired relaxation). Right Ventricle: The right ventricular size is mildly enlarged. Right ventricular systolic function is normal. Left Atrium: Left atrial size was normal in size. Right Atrium: Right atrial size was normal in size. Pericardium: There is no evidence of pericardial effusion. Mitral Valve: The mitral valve is normal in structure. Mild mitral annular calcification. No evidence of mitral valve regurgitation. No evidence of mitral valve stenosis. Tricuspid Valve: The tricuspid valve is normal in structure. Tricuspid valve regurgitation is trivial. No evidence of tricuspid stenosis. Aortic Valve: The aortic valve is tricuspid. Aortic valve regurgitation is mild. Aortic regurgitation PHT measures 361 msec. Aortic valve sclerosis/calcification is present, without any evidence of  aortic stenosis. Aortic valve mean gradient measures 11.0 mmHg. Aortic valve peak gradient measures 19.5 mmHg. Aortic valve area, by VTI measures 2.56 cm. Pulmonic Valve: The pulmonic valve was normal in structure. Pulmonic valve regurgitation is not visualized. No evidence of pulmonic stenosis. Aorta: The aortic root is normal in size and structure. Venous: The inferior vena cava is normal in size with greater than 50% respiratory variability, suggesting right atrial pressure of 3 mmHg. IAS/Shunts: No atrial level shunt detected by color flow Doppler. Additional Comments: Mild hypokinesis of the distal septum/apex; overall low normal LV function.  LEFT VENTRICLE PLAX 2D LVIDd:         6.10 cm   Diastology LVIDs:         4.10 cm   LV e' medial:    5.44 cm/s LV PW:         1.10 cm   LV E/e' medial:  10.6 LV IVS:        1.40 cm   LV e' lateral:   4.13 cm/s LVOT diam:     2.30 cm   LV E/e' lateral: 13.9 LV SV:         106 LV SV Index:   49 LVOT Area:     4.15 cm  RIGHT VENTRICLE RV S prime:     15.30 cm/s TAPSE (M-mode): 1.8 cm LEFT ATRIUM             Index        RIGHT ATRIUM           Index LA Vol (A2C):   39.9 ml 18.50 ml/m  RA Area:     15.00 cm LA Vol (  A4C):   64.4 ml 29.87 ml/m  RA Volume:   34.30 ml  15.91 ml/m LA Biplane Vol: 53.1 ml 24.63 ml/m  AORTIC VALVE AV Area (Vmax):    2.54 cm AV Area (Vmean):   2.20 cm AV Area (VTI):     2.56 cm AV Vmax:           221.00 cm/s AV Vmean:          155.000 cm/s AV VTI:            0.414 m AV Peak Grad:      19.5 mmHg AV Mean Grad:      11.0 mmHg LVOT Vmax:         135.00 cm/s LVOT Vmean:        82.200 cm/s LVOT VTI:          0.255 m LVOT/AV VTI ratio: 0.62 AI PHT:            361 msec  AORTA Ao Root diam: 3.20 cm Ao Asc diam:  3.50 cm MITRAL VALVE MV Area (PHT): 2.76 cm    SHUNTS MV Decel Time: 275 msec    Systemic VTI:  0.26 m MV E velocity: 57.40 cm/s  Systemic Diam: 2.30 cm MV A velocity: 75.40 cm/s MV E/A ratio:  0.76 Redell Shallow MD Electronically signed by  Redell Shallow MD Signature Date/Time: 12/25/2023/4:54:42 PM    Final         Scheduled Meds:  acetaminophen   1,000 mg Oral QID   [START ON 12/27/2023] amoxicillin   1,000 mg Oral Q8H   aspirin  EC  81 mg Oral Daily   atorvastatin   80 mg Oral Daily   busPIRone   5 mg Oral BID   docusate sodium   100 mg Oral BID   enoxaparin  (LOVENOX ) injection  50 mg Subcutaneous Q24H   escitalopram   5 mg Oral Daily   Gerhardt's butt cream   Topical BID   lactulose   20 g Oral TID   levETIRAcetam   500 mg Oral BID   melatonin  10 mg Oral QHS   methocarbamol   500 mg Oral TID   multivitamin with minerals  1 tablet Oral Daily   OXcarbazepine   150 mg Oral BID   oxymetazoline   1 spray Each Nare BID   pantoprazole   40 mg Oral Daily   senna  2 tablet Oral QHS   tamsulosin   0.4 mg Oral Daily   Continuous Infusions:  sodium chloride  40 mL/hr at 12/26/23 0858     LOS: 2 days    Time spent: 35 minutes    Renato Applebaum, MD [facility]

## 2023-12-26 NOTE — Progress Notes (Signed)
 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  If he still here in the morning with dose in the gamma ceftriaxone  2 g and I would switch him over to amoxicillin  to 500 mg tablets equals 1000 mg to be taken 3 times daily to complete a total 14 days of therapy between ceftriaxone  and amoxicillin   I will sign off for now please call with further questions.  Principal Problem:   Acute hypoxemic respiratory failure (HCC) Active Problems:   Streptococcal bacteremia   COPD (chronic obstructive pulmonary disease) (HCC)   Pain of amputation stump of right lower extremity (HCC)   Acute encephalopathy   Scheduled Meds:  acetaminophen   1,000 mg Oral QID   aspirin  EC  81 mg Oral Daily   atorvastatin   80 mg Oral Daily   busPIRone   5 mg Oral BID   docusate sodium   100 mg Oral BID   enoxaparin  (LOVENOX ) injection  50 mg Subcutaneous Q24H   escitalopram   5 mg Oral Daily   Gerhardt's butt cream   Topical BID   lactulose   20 g Oral TID   levETIRAcetam   500 mg Oral BID   melatonin  10 mg Oral QHS   methocarbamol   500 mg Oral TID   multivitamin with minerals  1 tablet Oral Daily   OXcarbazepine   150 mg Oral BID   oxymetazoline   1 spray Each Nare BID   pantoprazole   40 mg Oral Daily   senna  2 tablet Oral QHS   tamsulosin   0.4 mg Oral Daily   Continuous Infusions:  sodium chloride  40 mL/hr at 12/26/23 0858   cefTRIAXone  (ROCEPHIN )  IV 2 g (12/26/23 0746)   PRN  Meds:.ipratropium-albuterol , naLOXone  (NARCAN )  injection, ondansetron , mouth rinse, oxyCODONE   HPI: Gregory [name] is a 63 y.o. 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  In the ER his chest x-ray showed some vascular congestion.  He had blood cultures taken and these have yielded group B streptococci in 1 of 2 sites sampled.  He has been on ceftriaxone  for the past 3 days and has been clinically improving.  He is eager to be discharged back to [facility].  His 2D echocardiogram fails to show evidence of endocarditis.  I do not see there is reason to pursue a TEE.  He does not have an obvious source of infection though again I think the likelihood of endocarditis is low.  I feel comfortable having him complete 2 weeks of antibiotics with high-dose oral amoxicillin  1 g 3 times daily.  He can start on this when he leaves the hospital if he is still here tomorrow I would give him another dose of ceftriaxone  prior to switching him to amoxicillin    I have personally spent 80 minutes involved  in face-to-face and non-face-to-face activities for this patient on the day of the visit. Professional time spent includes the following activities: Preparing to see the patient (review of tests), Obtaining and/or reviewing separately obtained history (admission/discharge record), Performing a medically appropriate examination and/or evaluation , Ordering medications/tests/procedures, referring and communicating with other health care professionals, Documenting clinical information in the EMR, Independently interpreting results (not separately reported), Communicating results to the patient/family/caregiver, Counseling and educating the  patient/family/caregiver and Care coordination (not separately reported).   Evaluation of the patient requires complex antimicrobial therapy evaluation, counseling , isolation needs to reduce disease transmission and risk assessment and mitigation.     Review of Systems: Review of Systems  Constitutional:  Positive for chills. Negative for fever, malaise/fatigue and weight loss.  HENT:  Negative for congestion and sore throat.   Eyes:  Negative for blurred vision and photophobia.  Respiratory:  Positive for shortness of breath. Negative for cough and wheezing.   Cardiovascular:  Negative for chest pain, palpitations and leg swelling.  Gastrointestinal:  Negative for abdominal pain, blood in stool, constipation, diarrhea, heartburn, melena, nausea and vomiting.  Genitourinary:  Negative for dysuria, flank pain and hematuria.  Musculoskeletal:  Negative for back pain, falls, joint pain and myalgias.  Skin:  Negative for itching and rash.  Neurological:  Negative for dizziness, focal weakness, loss of consciousness, weakness and headaches.  Endo/Heme/Allergies:  Does not bruise/bleed easily.  Psychiatric/Behavioral:  Negative for depression and suicidal ideas. The patient does not have insomnia.     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    Chronic pain syndrome    COPD (chronic obstructive pulmonary disease) (HCC)    DDD (degenerative disc disease), lumbar    Depression    major  depressive disorder    Diabetes mellitus without complication (HCC)    type 2    Diabetic ulcer of toe of left foot (HCC) 10/02/2016   Difficult intubation    Difficulty in walking, not elsewhere classified    Epidural abscess 10/02/2016   Foot drop, bilateral    Foot drop, right 12/27/2015   GERD (gastroesophageal reflux disease)    Herpesviral vesicular dermatitis    History of COVID-19 03/15/2022   Hyperlipidemia    Hyperlipidemia    Hypertension    Insomnia    Malignant melanoma of other parts of face (HCC)    Melanoma (HCC)    MRSA bacteremia    Muscle weakness (generalized)    Nasal congestion    Necrosis of toe (HCC) 07/08/2018   Neuromuscular dysfunction of bladder    Osteomyelitis (HCC)    Osteomyelitis (HCC)    Osteomyelitis (HCC)    right BKA   Other idiopathic peripheral autonomic neuropathy    Retention of urine, unspecified    Squamous cell carcinoma of skin    Stroke (HCC)    Unsteadiness on feet  Urinary retention    Urinary retention    Urinary tract infection    Wears dentures    Wears glasses     Social History   Tobacco Use   Smoking status: Every Day    Current packs/day: 0.50    Types: Cigarettes   Smokeless tobacco: Never   Tobacco comments:    11/19/18  Vaping Use   Vaping status: Never Used  Substance Use Topics   Alcohol  use: No   Drug use: No    Family History  Problem Relation Age of Onset   Diabetes Mother    Heart disease Mother    Cancer Mother    Cancer Father    Bone cancer Father    Prostate cancer Father    Cancer Paternal Grandfather    Cancer Other    Diabetes Other    Allergies  Allergen Reactions   Latex Other (See Comments)    Allergic, per MAR    OBJECTIVE: Blood pressure 139/71, pulse 74, temperature 98.1 F (36.7 C), temperature source Axillary, resp. rate 18, height 5' 5 (1.651 m), weight 111.1 kg, SpO2 96%.  Physical Exam Constitutional:      Appearance: He is well-developed.  HENT:      Head: Normocephalic and atraumatic.  Eyes:     Conjunctiva/sclera: Conjunctivae normal.  Cardiovascular:     Rate and Rhythm: Normal rate and regular rhythm.     Heart sounds: No murmur heard.    No friction rub. No gallop.  Pulmonary:     Effort: Pulmonary effort is normal. No respiratory distress.     Breath sounds: No stridor. No wheezing or rhonchi.  Abdominal:     General: There is no distension.     Palpations: Abdomen is soft.  Musculoskeletal:        General: No tenderness. Normal range of motion.     Cervical back: Normal range of motion and neck supple.  Skin:    General: Skin is warm and dry.     Coloration: Skin is not pale.     Findings: No erythema or rash.  Neurological:     General: No focal deficit present.     Mental Status: He is alert and oriented to person, place, and time.  Psychiatric:        Mood and Affect: Mood normal.        Behavior: Behavior normal.        Thought Content: Thought content normal.        Judgment: Judgment normal.    Right above-the-knee amputation site is clean left lower extremity does not show evidence of cellulitis    Lab Results Lab Results  Component Value Date   WBC 11.2 (H) 12/24/2023   HGB 11.9 (L) 12/24/2023   HCT 35.0 (L) 12/24/2023   MCV 86.3 12/24/2023   PLT 142 (L) 12/24/2023    Lab Results  Component Value Date   CREATININE 2.32 (H) 12/26/2023   BUN 27 (H) 12/26/2023   NA 136 12/26/2023   K 3.6 12/26/2023   CL 105 12/26/2023   CO2 23 12/26/2023    Lab Results  Component Value Date   ALT 10 12/24/2023   AST 24 12/24/2023   ALKPHOS 95 12/24/2023   BILITOT 0.8 12/24/2023     Microbiology: Recent Results (from the past 240 hours)  Resp panel by RT-PCR (RSV, Flu A&B, Covid) Anterior Nasal Swab     Status: None   Collection Time: 12/24/23  6:47 AM  Specimen: Anterior Nasal Swab  Result Value Ref Range Status   SARS Coronavirus 2 by RT PCR NEGATIVE NEGATIVE Final   Influenza A by PCR NEGATIVE  NEGATIVE Final   Influenza B by PCR NEGATIVE NEGATIVE Final    Comment: (NOTE) The Xpert Xpress SARS-CoV-2/FLU/RSV plus assay is intended as an aid in the diagnosis of influenza from Nasopharyngeal swab specimens and should not be used as a sole basis for treatment. Nasal washings and aspirates are unacceptable for Xpert Xpress SARS-CoV-2/FLU/RSV testing.  Fact Sheet for Patients: BloggerCourse.com  Fact Sheet for Healthcare Providers: SeriousBroker.it  This test is not yet approved or cleared by the [place]  FDA and has been authorized for detection and/or diagnosis of SARS-CoV-2 by FDA under an Emergency Use Authorization (EUA). This EUA will remain in effect (meaning this test can be used) for the duration of the COVID-19 declaration under Section 564(b)(1) of the Act, 21 U.S.C. section 360bbb-3(b)(1), unless the authorization is terminated or revoked.     Resp Syncytial Virus by PCR NEGATIVE NEGATIVE Final    Comment: (NOTE) Fact Sheet for Patients: BloggerCourse.com  Fact Sheet for Healthcare Providers: SeriousBroker.it  This test is not yet approved or cleared by the [place]  FDA and has been authorized for detection and/or diagnosis of SARS-CoV-2 by FDA under an Emergency Use Authorization (EUA). This EUA will remain in effect (meaning this test can be used) for the duration of the COVID-19 declaration under Section 564(b)(1) of the Act, 21 U.S.C. section 360bbb-3(b)(1), unless the authorization is terminated or revoked.  Performed at [facility], [address], [place], [place] [address]   Blood Culture (routine x 2)     Status: Abnormal   Collection Time: 12/24/23  6:57 AM   Specimen: BLOOD  Result Value Ref Range Status   Specimen Description BLOOD SITE NOT SPECIFIED  Final   Special Requests   Final    BOTTLES DRAWN AEROBIC AND ANAEROBIC Blood  Culture results may not be optimal due to an inadequate volume of blood received in culture bottles   Culture  Setup Time   Final    GRAM POSITIVE COCCI IN CHAINS IN BOTH AEROBIC AND ANAEROBIC BOTTLES CRITICAL RESULT CALLED TO, READ BACK BY AND VERIFIED WITH: PHARMD C.AMEND [phone] @ 2043 FH Performed at [facility], [address] [place], [place] [address]    Culture GROUP B STREP(S.AGALACTIAE)ISOLATED (A)  Final   Report Status 12/26/2023 FINAL  Final   Organism ID, Bacteria GROUP B STREP(S.AGALACTIAE)ISOLATED  Final      Susceptibility   Group b strep(s.agalactiae)isolated - MIC*    CLINDAMYCIN  >=1 RESISTANT Resistant     AMPICILLIN  <=0.25 SENSITIVE Sensitive     ERYTHROMYCIN >=8 RESISTANT Resistant     VANCOMYCIN  0.5 SENSITIVE Sensitive     CEFTRIAXONE  <=0.12 SENSITIVE Sensitive     LEVOFLOXACIN  1 SENSITIVE Sensitive     PENICILLIN  <=0.06 SENSITIVE Sensitive     * GROUP B STREP(S.AGALACTIAE)ISOLATED  Blood Culture ID Panel (Reflexed)     Status: Abnormal   Collection Time: 12/24/23  6:57 AM  Result Value Ref Range Status   Enterococcus faecalis NOT DETECTED NOT DETECTED Final   Enterococcus Faecium NOT DETECTED NOT DETECTED Final   Listeria monocytogenes NOT DETECTED NOT DETECTED Final   Staphylococcus species NOT DETECTED NOT DETECTED Final   Staphylococcus aureus (BCID) NOT DETECTED NOT DETECTED Final   Staphylococcus epidermidis NOT DETECTED NOT DETECTED Final   Staphylococcus lugdunensis NOT DETECTED NOT DETECTED Final  Streptococcus species DETECTED (A) NOT DETECTED Final    Comment: CRITICAL RESULT CALLED TO, READ BACK BY AND VERIFIED WITH: PHARMD C.AMEND [phone] @ 2043 FH    Streptococcus agalactiae DETECTED (A) NOT DETECTED Final    Comment: CRITICAL RESULT CALLED TO, READ BACK BY AND VERIFIED WITH: PHARMD C.AMEND [phone] @ 2043 FH    Streptococcus pneumoniae NOT DETECTED NOT DETECTED Final   Streptococcus pyogenes NOT DETECTED NOT DETECTED Final    A.calcoaceticus-baumannii NOT DETECTED NOT DETECTED Final   Bacteroides fragilis NOT DETECTED NOT DETECTED Final   Enterobacterales NOT DETECTED NOT DETECTED Final   Enterobacter cloacae complex NOT DETECTED NOT DETECTED Final   Escherichia coli NOT DETECTED NOT DETECTED Final   Klebsiella aerogenes NOT DETECTED NOT DETECTED Final   Klebsiella oxytoca NOT DETECTED NOT DETECTED Final   Klebsiella pneumoniae NOT DETECTED NOT DETECTED Final   Proteus species NOT DETECTED NOT DETECTED Final   Salmonella species NOT DETECTED NOT DETECTED Final   Serratia marcescens NOT DETECTED NOT DETECTED Final   Haemophilus influenzae NOT DETECTED NOT DETECTED Final   Neisseria meningitidis NOT DETECTED NOT DETECTED Final   Pseudomonas aeruginosa NOT DETECTED NOT DETECTED Final   Stenotrophomonas maltophilia NOT DETECTED NOT DETECTED Final   Candida albicans NOT DETECTED NOT DETECTED Final   Candida auris NOT DETECTED NOT DETECTED Final   Candida glabrata NOT DETECTED NOT DETECTED Final   Candida krusei NOT DETECTED NOT DETECTED Final   Candida parapsilosis NOT DETECTED NOT DETECTED Final   Candida tropicalis NOT DETECTED NOT DETECTED Final   Cryptococcus neoformans/gattii NOT DETECTED NOT DETECTED Final    Comment: Performed at [facility], [address] [place], [place] [address]  Blood Culture (routine x 2)     Status: None (Preliminary result)   Collection Time: 12/24/23  7:36 AM   Specimen: BLOOD LEFT FOREARM  Result Value Ref Range Status   Specimen Description BLOOD LEFT FOREARM  Final   Special Requests   Final    BOTTLES DRAWN AEROBIC AND ANAEROBIC Blood Culture results may not be optimal due to an inadequate volume of blood received in culture bottles   Culture   Final    NO GROWTH 2 DAYS Performed at [facility], [address], [place], [place] [address]    Report Status PENDING  Incomplete  MRSA Next Gen by PCR, Nasal     Status: None   Collection Time: 12/24/23  3:59 PM    Specimen: Nasal Mucosa; Nasal Swab  Result Value Ref Range Status   MRSA by PCR Next Gen NOT DETECTED NOT DETECTED Final    Comment: (NOTE) The GeneXpert MRSA Assay (FDA approved for NASAL specimens only), is one component of a comprehensive MRSA colonization surveillance program. It is not intended to diagnose MRSA infection nor to guide or monitor treatment for MRSA infections. Test performance is not FDA approved in patients less than 73 years old. Performed at [facility], [address], [place], [place] [address]     Jomarie Fleeta Rothman, MD [facility] [phone] pager  12/26/2023, 11:42 AM

## 2023-12-26 NOTE — Progress Notes (Addendum)
 Patient refused wound care for both foot and sacral areas. During the RN's explanation of the risks and benefits of wound care, the patient became increasingly agitated and shouted that he did not want any wound care at this time (pt refused wound care in the prior shift per day shift RN). The care team will continue to use therapeutic communication techniques to encourage the patient's acceptance of wound care.

## 2023-12-26 NOTE — Plan of Care (Signed)

## 2023-12-26 NOTE — Progress Notes (Signed)
 Nurse called into room patient asked for PRN pain medication. Daughter told nurse that she cut patients O2 down to 2L. She thinks that he should be weaned off of it. Then Daughter stated that patient asked her to cut it down. Patient continues to ring call light for PRN pain medication every 6 hours.

## 2023-12-26 NOTE — TOC Progression Note (Signed)
 Transition of Care [facility]) - Progression Note    Patient Details  Name: Gregory [name] MRN: [id number] Date of Birth: [birth year]  [facility]) CM/SW Contact  Isaiah Public, LCSWA Phone Number: 12/26/2023, 12:50 PM  Clinical Narrative:     Patient [facility] SN[facility] bed [facility] when medically ready for dc. CSW will continue to follow.  Expected Discharge Plan: Skilled Nursing Facility Barriers to Discharge: Continued Medical Work up  Expected Discharge Plan and Services In-house Referral: Clinical Social Work     Living arrangements for the past 2 months: Skilled Nursing Facility                                       Social Determinants of Health (SDOH) Interventions SDOH Screenings   Food Insecurity: No Food Insecurity (12/24/2023)  Housing: Low Risk  (12/24/2023)  Transportation Needs: No Transportation Needs (12/24/2023)  Utilities: Not At Risk (12/24/2023)  Depression (PHQ2-9): Low Risk  (03/08/2021)  Social Connections: Socially Isolated (09/26/2023)  Tobacco Use: High Risk (12/24/2023)    Readmission Risk Interventions    09/04/2023    1:49 PM 09/06/2022    1:48 PM 08/27/2022   12:46 PM  Readmission Risk Prevention Plan  Transportation Screening Complete Complete Complete  Medication Review Oceanographer) Complete Complete Complete  PCP or Specialist appointment within 3-5 days of discharge Complete Complete Complete  HRI or Home Care Consult Complete Complete Complete  SW Recovery Care/Counseling Consult Complete Complete Complete  Palliative Care Screening Not Applicable Not Applicable Not Applicable  Skilled Nursing Facility Complete Complete Complete

## 2023-12-26 NOTE — Progress Notes (Signed)
 Patient rung call bell for nurse. Nurse entered room and patient told nurse to turn oxygen back up because he feels like he cannot breath. Nurse turned O2 back up to 3 Liters. MD made aware.

## 2023-12-27 DIAGNOSIS — J9601 Acute respiratory failure with hypoxia: Secondary | ICD-10-CM | POA: Diagnosis not present

## 2023-12-27 LAB — GLUCOSE, CAPILLARY
Glucose-Capillary: 90 mg/dL (ref 70–99)
Glucose-Capillary: 107 mg/dL — ABNORMAL HIGH (ref 70–99)

## 2023-12-27 MED ORDER — AMOXICILLIN 500 MG PO CAPS: 1000.0000 mg | ORAL_CAPSULE | Freq: Three times a day (TID) | ORAL | 0 refills | Status: AC

## 2023-12-27 NOTE — Progress Notes (Signed)
 Report given to [facility] RN at [facility], Pt is being transported by daughter. RN at [place] stated their provider at [facility] will provide pain med script.

## 2023-12-27 NOTE — TOC Transition Note (Signed)
 Transition of Care [facility]) - Discharge Note   Patient Details  Name: Gregory [name] MRN: [id number] Date of Birth: 04-17-1961  Transition of Care [facility]) CM/SW Contact:  Bridget Cordella Simmonds, LCSW Phone Number: 12/27/2023, 1:52 PM   Clinical Narrative:   Pt discharging to [facility].  RN call report [phone].  Pt transported by daughter.  CSW came to floor to confirm pt had hard script for pain medication.  Per RN, pt and daughter already left, no pain med script was on the chart.  RN will message MD for script and is calling the daughter so she can return to get it.   1300: CSW spoke with [facility] to confirm they can receive pt.      Final next level of care: Skilled Nursing Facility Barriers to Discharge: Barriers Resolved   Patient Goals and CMS Choice Patient states their goals for this hospitalization and ongoing recovery are:: SNF   Choice offered to / list presented to : Patient      Discharge Placement              Patient chooses bed at:  [facility]) Patient to be transferred to facility by: daughter [name] Name of family member notified: pt and daughter already left by the time CSW came to the floor    Discharge Plan and Services Additional resources added to the After Visit Summary for   In-house Referral: Clinical Social Work                                   Social Drivers of Health (SDOH) Interventions SDOH Screenings   Food Insecurity: No Food Insecurity (12/24/2023)  Housing: Low Risk  (12/24/2023)  Transportation Needs: No Transportation Needs (12/24/2023)  Utilities: Not At Risk (12/24/2023)  Depression (PHQ2-9): Low Risk  (03/08/2021)  Social Connections: Socially Isolated (09/26/2023)  Tobacco Use: High Risk (12/24/2023)     Readmission Risk Interventions    09/04/2023    1:49 PM 09/06/2022    1:48 PM 08/27/2022   12:46 PM  Readmission Risk Prevention Plan  Transportation Screening Complete Complete Complete  Medication  Review Oceanographer) Complete Complete Complete  PCP or Specialist appointment within 3-5 days of discharge Complete Complete Complete  HRI or Home Care Consult Complete Complete Complete  SW Recovery Care/Counseling Consult Complete Complete Complete  Palliative Care Screening Not Applicable Not Applicable Not Applicable  Skilled Nursing Facility Complete Complete Complete

## 2023-12-27 NOTE — Discharge Summary (Signed)
 Physician Discharge Summary  Gregory [name] DOB: 12-22-1960 DOA: 12/24/2023  PCP: [name], Provider, MD  Admit date: 12/24/2023 Discharge date: 12/27/2023  Admitted From: Long-term nursing home Disposition: Long-term nursing home  Recommendations for Outpatient Follow-up:  Follow up with PCP in 1-2 weeks Please obtain BMP/CBC in one week  Discharge Condition: Stable CODE STATUS: Full code Diet recommendation: Low-carb diet  Discharge summary: 63 year old gentleman with history of hypertension, hyperlipidemia, type 2 diabetes on insulin , cervical spinal stenosis, high output ileostomy, COPD, right AKA and chronic pain syndrome on multiple narcotics, history of stroke who is a long-term nursing home resident brought to the ER with confusion, could not breathe.  he just could not breathe in the morning so they sent her to the ER.  In the emergency room initially tachycardic and tachypneic on 4 L oxygen.  Sodium 127.  Creatinine 1.9.  Chest x-ray with vascular congestion and cardiomegaly.  Admitted due to significant symptoms.  Patient was ultimately found to have streptococcal bacteremia with possible source being his leg with chronic lymphedema.  Ultimately stabilized.  Going back to nursing home today.   Assessment & Plan:   Group B strep bacteremia: One of the 2 blood cultures positive for group B strep.  Previous history of bacteremia.  Right above-knee amputation.  Left leg with lymphedema.   Repeat blood cultures negative.   Echocardiogram without evidence of endocarditis.   Seen by infectious disease.  Patient received 3 days of Rocephin , recommendations are to complete high-dose amoxicillin  1000 mg 3 times daily for 10 days.     Acute metabolic encephalopathy: Improved.  Likely multifactorial.  Suspect polypharmacy.  Patient is on BuSpar , Trileptal , Lexapro , Keppra  and oxycodone  15 mg up to 4 times a day. Does not have any focal deficits.  With improved mentation, resume all  medications.   Acute hypoxic respiratory failure: Uses 2 to 3 L of oxygen at baseline if he needs to. Chest x-ray with vascular congestion.  Clinically euvolemic.  Maintenance fluid next 24 hours.  Echocardiogram fairly normal.  Bronchodilator therapy and chest physiotherapy to continue.   AKI on CKD stage IIIa: Baseline creatinine about 1.8-1.9.  Creatinine was 2.3.  Please recheck in 1 week.  BPH: On Flomax .  Continue.   Stage II pressure wound of the right buttock: Protective dressing.   Complex pain management: Patient is on multiple antidepressants and opiates, gabapentin .  This is continued.  Type 2 diabetes: Well-controlled as per patient.  Patient is on sliding scale insulin , dulaglutide  and liraglutide both at the same time.  Not sure about the medication administration.  Continue insulin , continue dulaglutide  every once a week.  Discontinue liraglutide.   Stable to transition back to nursing home.    Discharge Diagnoses:  Principal Problem:   Acute hypoxemic respiratory failure (HCC) Active Problems:   Streptococcal bacteremia   COPD (chronic obstructive pulmonary disease) (HCC)   Pain of amputation stump of right lower extremity (HCC)   Acute encephalopathy    Discharge Instructions  Discharge Instructions     Diet - low sodium heart healthy   Complete by: As directed    Discharge wound care:   Complete by: As directed    Local wound care   Increase activity slowly   Complete by: As directed       Allergies as of 12/27/2023       Reactions   Latex Other (See Comments)   Allergic, per [facility]        Medication List  STOP taking these medications    liraglutide 18 MG/3ML Sopn Commonly known as: VICTOZA       TAKE these medications    acetaminophen  325 MG tablet Commonly known as: TYLENOL  Take 2 tablets (650 mg total) by mouth every 6 (six) hours as needed for mild pain (pain score 1-3) (or Fever >/= 101).   amoxicillin  500 MG  capsule Commonly known as: AMOXIL  Take 2 capsules (1,000 mg total) by mouth every 8 (eight) hours for 10 days.   aspirin  EC 81 MG tablet Take 81 mg by mouth daily.   atorvastatin  80 MG tablet Commonly known as: LIPITOR  Take 1 tablet (80 mg total) by mouth daily.   busPIRone  5 MG tablet Commonly known as: BUSPAR  Take 5 mg by mouth in the morning and at bedtime.   Cholecalciferol  25 MCG (1000 UT) capsule Take 2,000 Units by mouth daily.   docusate sodium  100 MG capsule Commonly known as: COLACE Take 100 mg by mouth every 12 (twelve) hours.   Dulaglutide  0.75 MG/0.5ML Soaj Inject 0.75 mg into the skin every Thursday.   escitalopram  5 MG tablet Commonly known as: LEXAPRO  Take 5 mg by mouth See admin instructions. Take 5 mg by mouth at 7 AM for 14 days, then stop What changed: Another medication with the same name was removed. Continue taking this medication, and follow the directions you see here.   ferrous sulfate  325 (65 FE) MG tablet Take 1 tablet (325 mg total) by mouth 3 (three) times daily with meals. What changed: when to take this   insulin  lispro 100 UNIT/ML KwikPen Commonly known as: HUMALOG  Inject 0-10 Units into the skin See admin instructions. Inject 0-10 units into the skin three times a day and at bedtime, PER SLIDING SCALE:  BGL 151-200 = 2 units 201-250 = 4 units 251-300 = 6 units 301-350 =  8 units 351-400 = 10 units What changed: additional instructions   ipratropium-albuterol  0.5-2.5 (3) MG/3ML Soln Commonly known as: DUONEB Take 3 mLs by nebulization every 6 (six) hours as needed (shortness of breath, wheezing).   lactulose  10 GM/15ML solution Commonly known as: CHRONULAC  Take 30 mLs (20 g total) by mouth 2 (two) times daily.   levETIRAcetam  500 MG tablet Commonly known as: Keppra  Take 1 tablet (500 mg total) by mouth 2 (two) times daily.   lidocaine  4 % Place 1 patch onto the skin See admin instructions. Apply 1 new patch to the affected area  and remove 12 hours later   magnesium  oxide 400 (240 Mg) MG tablet Commonly known as: MAG-OX Take 400 mg by mouth 3 (three) times daily.   Melatonin 10 MG Tabs Take 10 mg by mouth at bedtime.   methocarbamol  500 MG tablet Commonly known as: ROBAXIN  Take 500 mg by mouth 3 (three) times daily.   multivitamin with minerals tablet Take 1 tablet by mouth daily.   Narcan  4 MG/0.1ML Liqd nasal spray kit Generic drug: naloxone  Place 1 spray into the nose See admin instructions. Instill 1 spray into alternating nostrils every 5 minutes as needed for an opiate overdose   naloxone  0.4 MG/ML injection Commonly known as: NARCAN  Inject 0.4 mg into the muscle as needed (for oversedation).   omeprazole 20 MG capsule Commonly known as: PRILOSEC Take 20 mg by mouth at bedtime.   ondansetron  4 MG tablet Commonly known as: ZOFRAN  Take 4 mg by mouth every 6 (six) hours as needed for nausea or vomiting.   OXcarbazepine  150 MG tablet Commonly known  as: TRILEPTAL  Take 1 tablet (150 mg total) by mouth 2 (two) times daily.   oxyCODONE  20 MG/ML concentrated solution Commonly known as: ROXICODONE  INTENSOL Take 10-15 mg by mouth See admin instructions. Give 15 mg by mouth at 9 AM, 1 PM, 5 PM, and 9 PM and an additional 10 mg every six hours as needed for pain   pregabalin  200 MG capsule Commonly known as: LYRICA  Take 200 mg by mouth 2 (two) times daily.   prochlorperazine  5 MG tablet Commonly known as: COMPAZINE  Take 5 mg by mouth every 6 (six) hours as needed for nausea or vomiting.   PROTEIN PO Take 30 mLs by mouth 2 (two) times daily.   senna 8.6 MG Tabs tablet Commonly known as: SENOKOT Take 2 tablets by mouth at bedtime.   sodium bicarbonate  650 MG tablet Take 650 mg by mouth 2 (two) times daily.   tamsulosin  0.4 MG Caps capsule Commonly known as: FLOMAX  Take 1 capsule (0.4 mg total) by mouth daily.   Uro-MP 118 MG Caps Take 118 mg by mouth 2 (two) times daily.                Discharge Care Instructions  (From admission, onward)           Start     Ordered   12/27/23 0000  Discharge wound care:       Comments: Local wound care   12/27/23 0954            Allergies  Allergen Reactions   Latex Other (See Comments)    Allergic, per [facility]    Consultations: Infectious disease   Procedures/Studies: ECHOCARDIOGRAM COMPLETE Result Date: 12/25/2023    ECHOCARDIOGRAM REPORT   Patient Name:   DEMON VOLANTE Date of Exam: 12/25/2023 Medical Rec #:  [id number]    Height:       65.0 in Accession #:    [id number]   Weight:       245.0 lb Date of Birth:  06-29-1960   BSA:          2.156 m Patient Age:    62 years     BP:           130/57 mmHg Patient Gender: M            HR:           64 bpm. Exam Location:  Inpatient Procedure: 2D Echo, Cardiac Doppler, Color Doppler and Intracardiac            Opacification Agent (Both Spectral and Color Flow Doppler were            utilized during procedure). Indications:    Dyspnea  History:        Patient has prior history of Echocardiogram examinations. Risk                 Factors:Hypertension, Diabetes and Dyslipidemia.  Sonographer:    Christiana Mbomeh Referring Phys: [id number] RENATO APPLEBAUM IMPRESSIONS  1. Mild hypokinesis of the distal septum/apex; overall low normal LV function.  2. Left ventricular ejection fraction, by estimation, is 50 to 55%. The left ventricle has low normal function. The left ventricle demonstrates regional wall motion abnormalities (see scoring diagram/findings for description). The left ventricular internal cavity size was mildly dilated. There is mild left ventricular hypertrophy. Left ventricular diastolic parameters are consistent with Grade I diastolic dysfunction (impaired relaxation).  3. Right ventricular systolic function is normal. The right ventricular size is mildly enlarged.  4. The mitral valve is normal in structure. No evidence of mitral valve regurgitation. No evidence of mitral  stenosis.  5. The aortic valve is tricuspid. Aortic valve regurgitation is mild. Aortic valve sclerosis/calcification is present, without any evidence of aortic stenosis.  6. The inferior vena cava is normal in size with greater than 50% respiratory variability, suggesting right atrial pressure of 3 mmHg. FINDINGS  Left Ventricle: Left ventricular ejection fraction, by estimation, is 50 to 55%. The left ventricle has low normal function. The left ventricle demonstrates regional wall motion abnormalities. Definity  contrast agent was given IV to delineate the left ventricular endocardial borders. The left ventricular internal cavity size was mildly dilated. There is mild left ventricular hypertrophy. Left ventricular diastolic parameters are consistent with Grade I diastolic dysfunction (impaired relaxation). Right Ventricle: The right ventricular size is mildly enlarged. Right ventricular systolic function is normal. Left Atrium: Left atrial size was normal in size. Right Atrium: Right atrial size was normal in size. Pericardium: There is no evidence of pericardial effusion. Mitral Valve: The mitral valve is normal in structure. Mild mitral annular calcification. No evidence of mitral valve regurgitation. No evidence of mitral valve stenosis. Tricuspid Valve: The tricuspid valve is normal in structure. Tricuspid valve regurgitation is trivial. No evidence of tricuspid stenosis. Aortic Valve: The aortic valve is tricuspid. Aortic valve regurgitation is mild. Aortic regurgitation PHT measures 361 msec. Aortic valve sclerosis/calcification is present, without any evidence of aortic stenosis. Aortic valve mean gradient measures 11.0 mmHg. Aortic valve peak gradient measures 19.5 mmHg. Aortic valve area, by VTI measures 2.56 cm. Pulmonic Valve: The pulmonic valve was normal in structure. Pulmonic valve regurgitation is not visualized. No evidence of pulmonic stenosis. Aorta: The aortic root is normal in size and  structure. Venous: The inferior vena cava is normal in size with greater than 50% respiratory variability, suggesting right atrial pressure of 3 mmHg. IAS/Shunts: No atrial level shunt detected by color flow Doppler. Additional Comments: Mild hypokinesis of the distal septum/apex; overall low normal LV function.  LEFT VENTRICLE PLAX 2D LVIDd:         6.10 cm   Diastology LVIDs:         4.10 cm   LV e' medial:    5.44 cm/s LV PW:         1.10 cm   LV E/e' medial:  10.6 LV IVS:        1.40 cm   LV e' lateral:   4.13 cm/s LVOT diam:     2.30 cm   LV E/e' lateral: 13.9 LV SV:         106 LV SV Index:   49 LVOT Area:     4.15 cm  RIGHT VENTRICLE RV S prime:     15.30 cm/s TAPSE (M-mode): 1.8 cm LEFT ATRIUM             Index        RIGHT ATRIUM           Index LA Vol (A2C):   39.9 ml 18.50 ml/m  RA Area:     15.00 cm LA Vol (A4C):   64.4 ml 29.87 ml/m  RA Volume:   34.30 ml  15.91 ml/m LA Biplane Vol: 53.1 ml 24.63 ml/m  AORTIC VALVE AV Area (Vmax):    2.54 cm AV Area (Vmean):   2.20 cm AV Area (VTI):     2.56 cm AV Vmax:  221.00 cm/s AV Vmean:          155.000 cm/s AV VTI:            0.414 m AV Peak Grad:      19.5 mmHg AV Mean Grad:      11.0 mmHg LVOT Vmax:         135.00 cm/s LVOT Vmean:        82.200 cm/s LVOT VTI:          0.255 m LVOT/AV VTI ratio: 0.62 AI PHT:            361 msec  AORTA Ao Root diam: 3.20 cm Ao Asc diam:  3.50 cm MITRAL VALVE MV Area (PHT): 2.76 cm    SHUNTS MV Decel Time: 275 msec    Systemic VTI:  0.26 m MV E velocity: 57.40 cm/s  Systemic Diam: 2.30 cm MV A velocity: 75.40 cm/s MV E/A ratio:  0.76 Redell Shallow MD Electronically signed by Redell Shallow MD Signature Date/Time: 12/25/2023/4:54:42 PM    Final    DG Chest Port 1 View Result Date: 12/24/2023 CLINICAL DATA:  Shortness of breath with respiratory distress. EXAM: PORTABLE CHEST 1 VIEW COMPARISON:  12/03/2023 FINDINGS: The cardio pericardial silhouette is enlarged. Diffuse interstitial and basilar alveolar opacity is  superimposed on vascular congestion in likely reflects edema. No substantial pleural effusion. No acute bony abnormality. Telemetry leads overlie the chest. IMPRESSION: Enlargement of the cardiopericardial silhouette with vascular congestion and diffuse interstitial and basilar alveolar opacity, likely edema. Electronically Signed   By: Camellia Candle M.D.   On: 12/24/2023 07:14   DG Foot 2 Views Left Result Date: 12/17/2023 Please see detailed radiograph report in office note.  EEG adult Result Date: 12/03/2023 Shelton Arlin KIDD, MD     12/03/2023  1:32 PM Patient Name: SKIP LITKE MRN: [id number] Epilepsy Attending: Arlin KIDD Shelton Referring Physician/Provider: Khaliqdina, Salman, MD Date: 12/03/2023 Duration: 25.03 mins Patient history: 63 y.o. 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 EEG to evaluate for seizure Level of alertness: Awake, asleep AEDs during EEG study: LEV, Ativan  Technical aspects: This EEG study was done with scalp electrodes positioned according to the 10-20 International system of electrode placement. Electrical activity was reviewed with band pass filter of 1-70Hz , sensitivity of 7 uV/mm, display speed of 83mm/sec with a 60Hz  notched filter applied as appropriate. EEG data were recorded continuously and digitally stored.  Video monitoring was available and reviewed as appropriate. Description: The posterior dominant rhythm consists of 7.5 Hz activity of moderate voltage (25-35 uV) seen predominantly in posterior head regions, symmetric and reactive to eye opening and eye closing. Sleep was characterized by vertex waves, sleep spindles (12 to 14 Hz), maximal frontocentral region. EEG showed continuous 3 to 6 Hz theta-delta slowing in right posterior quadrant. Hyperventilation and photic stimulation were not performed.   ABNORMALITY -  Continuous slow, right posterior quadrant. IMPRESSION: This study is suggestive of cortical dysfunction arising from right posterior quadrant likely secondary to underlying structural abnormality. No seizures or epileptiform discharges were seen throughout the recording. Arlin KIDD Shelton   DG Chest Portable 1 View Result Date: 12/03/2023 CLINICAL DATA:  New onset seizure EXAM: PORTABLE CHEST 1 VIEW COMPARISON:  Chest radiograph 09/25/2023 FINDINGS: Anterior and posterior cervical fusion. Stable cardiac silhouette. New fine airspace disease within LEFT and RIGHT lung. No focal consolidation.  No pneumothorax. No fracture identified IMPRESSION: Fine bilateral airspace disease suggests pulmonary edema. No focal infiltrate. Electronically Signed   By: Jackquline Boxer M.D.   On: 12/03/2023 09:53   CT Head Wo Contrast Result Date: 12/03/2023 CLINICAL DATA:  63 year old male status post witnessed seizure. Acute on chronic right PCA territory infarct earlier this year. EXAM: CT HEAD WITHOUT CONTRAST TECHNIQUE: Contiguous axial images were obtained from the base of the skull through the vertex without intravenous contrast. RADIATION DOSE REDUCTION: This exam was performed according to the departmental dose-optimization program which includes automated exposure control, adjustment of the mA and/or kV according to patient size and/or use of iterative reconstruction technique. COMPARISON:  Head CT 09/02/2023.  Brain MRI 06/27/2023. FINDINGS: Brain: Chronic right PCA territory encephalomalacia is extensive, appears stable from March including posterior hippocampal involvement. Ex vacuo enlargement of the right lateral ventricle is stable. No superimposed No midline shift, ventriculomegaly, mass effect, evidence of mass lesion, intracranial hemorrhage or evidence of cortically based acute infarction. Stable patchy white matter hypodensity elsewhere. Vascular: No suspicious intracranial vascular hyperdensity. Calcified  atherosclerosis at the skull base. Skull: Appears stable and intact. Partially visible cervical spine posterior fusion hardware. Sinuses/Orbits: Visualized paranasal sinuses and mastoids are stable and well aerated. Other: Leftward gaze, otherwise no acute orbit or scalp soft tissue finding. IMPRESSION: 1. No acute intracranial abnormality or acute traumatic injury identified. 2. Large chronic right PCA territory infarct with stable encephalomalacia, including right hippocampal involvement. Electronically Signed   By: VEAR Hurst M.D.   On: 12/03/2023 08:28   (Echo, Carotid, EGD, Colonoscopy, ERCP)    Subjective: Patient seen and examined.  No overnight events.  Pain is controlled.  He is very much looking forward to go back to nursing home.   Discharge Exam: Vitals:   12/27/23 0447 12/27/23 0746  BP: (!) 158/79 132/61  Pulse: 70 66  Resp:  18  Temp: 98.1 F (36.7 C) 98.5 F (36.9 C)  SpO2: 100% 91%   Vitals:   12/26/23 0400 12/26/23 2200 12/27/23 0447 12/27/23 0746  BP: 139/71 (!) 146/79 (!) 158/79 132/61  Pulse: 74 94 70 66  Resp: 18   18  Temp: 98.1 F (36.7 C) 98.2 F (36.8 C) 98.1 F (36.7 C) 98.5 F (36.9 C)  TempSrc: Axillary Oral Oral Oral  SpO2:  95% 100% 91%  Weight:      Height:       General exam: Pleasant interactive and comfortable today. Respiratory system: Clear to auscultation. Respiratory effort normal.  Some conducted upper airway sounds.  On 2 L oxygen. Cardiovascular system: S1 & S2 heard, RRR.  Gastrointestinal system: Soft.  Nontender.  Colostomy present. Central nervous system: Alert and oriented. No focal neurological deficits. Extremities:  Generalized weakness.  No focal deficits. Multiple chronic nonhealing ulcers left foot, chronic lymphedema. Right leg with AKA stump.  Clean and dry.    The results of significant diagnostics from this hospitalization (including imaging, microbiology, ancillary and laboratory) are listed below for reference.      Microbiology: Recent Results (from the past 240 hours)  Resp panel by RT-PCR (RSV, Flu A&B, Covid) Anterior Nasal Swab     Status: None   Collection Time: 12/24/23  6:47 AM   Specimen: Anterior Nasal Swab  Result Value Ref Range Status   SARS Coronavirus 2 by RT PCR NEGATIVE NEGATIVE Final   Influenza A by PCR NEGATIVE NEGATIVE Final   Influenza B by PCR NEGATIVE NEGATIVE Final    Comment: (NOTE)  The Xpert Xpress SARS-CoV-2/FLU/RSV plus assay is intended as an aid in the diagnosis of influenza from Nasopharyngeal swab specimens and should not be used as a sole basis for treatment. Nasal washings and aspirates are unacceptable for Xpert Xpress SARS-CoV-2/FLU/RSV testing.  Fact Sheet for Patients: BloggerCourse.com  Fact Sheet for Healthcare Providers: SeriousBroker.it  This test is not yet approved or cleared by the [place]  FDA and has been authorized for detection and/or diagnosis of SARS-CoV-2 by FDA under an Emergency Use Authorization (EUA). This EUA will remain in effect (meaning this test can be used) for the duration of the COVID-19 declaration under Section 564(b)(1) of the Act, 21 U.S.C. section 360bbb-3(b)(1), unless the authorization is terminated or revoked.     Resp Syncytial Virus by PCR NEGATIVE NEGATIVE Final    Comment: (NOTE) Fact Sheet for Patients: BloggerCourse.com  Fact Sheet for Healthcare Providers: SeriousBroker.it  This test is not yet approved or cleared by the [place]  FDA and has been authorized for detection and/or diagnosis of SARS-CoV-2 by FDA under an Emergency Use Authorization (EUA). This EUA will remain in effect (meaning this test can be used) for the duration of the COVID-19 declaration under Section 564(b)(1) of the Act, 21 U.S.C. section 360bbb-3(b)(1), unless the authorization is terminated or revoked.  Performed at  [facility], [address], [place], [place] [address]   Blood Culture (routine x 2)     Status: Abnormal   Collection Time: 12/24/23  6:57 AM   Specimen: BLOOD  Result Value Ref Range Status   Specimen Description BLOOD SITE NOT SPECIFIED  Final   Special Requests   Final    BOTTLES DRAWN AEROBIC AND ANAEROBIC Blood Culture results may not be optimal due to an inadequate volume of blood received in culture bottles   Culture  Setup Time   Final    GRAM POSITIVE COCCI IN CHAINS IN BOTH AEROBIC AND ANAEROBIC BOTTLES CRITICAL RESULT CALLED TO, READ BACK BY AND VERIFIED WITH: PHARMD C.AMEND [phone] @ 2043 FH Performed at [facility], [address] [place], [place] [address]    Culture GROUP B STREP(S.AGALACTIAE)ISOLATED (A)  Final   Report Status 12/26/2023 FINAL  Final   Organism ID, Bacteria GROUP B STREP(S.AGALACTIAE)ISOLATED  Final      Susceptibility   Group b strep(s.agalactiae)isolated - MIC*    CLINDAMYCIN  >=1 RESISTANT Resistant     AMPICILLIN  <=0.25 SENSITIVE Sensitive     ERYTHROMYCIN >=8 RESISTANT Resistant     VANCOMYCIN  0.5 SENSITIVE Sensitive     CEFTRIAXONE  <=0.12 SENSITIVE Sensitive     LEVOFLOXACIN  1 SENSITIVE Sensitive     PENICILLIN  <=0.06 SENSITIVE Sensitive     * GROUP B STREP(S.AGALACTIAE)ISOLATED  Blood Culture ID Panel (Reflexed)     Status: Abnormal   Collection Time: 12/24/23  6:57 AM  Result Value Ref Range Status   Enterococcus faecalis NOT DETECTED NOT DETECTED Final   Enterococcus Faecium NOT DETECTED NOT DETECTED Final   Listeria monocytogenes NOT DETECTED NOT DETECTED Final   Staphylococcus species NOT DETECTED NOT DETECTED Final   Staphylococcus aureus (BCID) NOT DETECTED NOT DETECTED Final   Staphylococcus epidermidis NOT DETECTED NOT DETECTED Final   Staphylococcus lugdunensis NOT DETECTED NOT DETECTED Final   Streptococcus species DETECTED (A) NOT DETECTED Final    Comment: CRITICAL RESULT CALLED TO, READ BACK BY AND VERIFIED  WITH: PHARMD C.AMEND [phone] @ 2043 FH    Streptococcus agalactiae DETECTED (A) NOT DETECTED Final    Comment: CRITICAL  RESULT CALLED TO, READ BACK BY AND VERIFIED WITH: PHARMD C.AMEND [phone] @ 2043 FH    Streptococcus pneumoniae NOT DETECTED NOT DETECTED Final   Streptococcus pyogenes NOT DETECTED NOT DETECTED Final   A.calcoaceticus-baumannii NOT DETECTED NOT DETECTED Final   Bacteroides fragilis NOT DETECTED NOT DETECTED Final   Enterobacterales NOT DETECTED NOT DETECTED Final   Enterobacter cloacae complex NOT DETECTED NOT DETECTED Final   Escherichia coli NOT DETECTED NOT DETECTED Final   Klebsiella aerogenes NOT DETECTED NOT DETECTED Final   Klebsiella oxytoca NOT DETECTED NOT DETECTED Final   Klebsiella pneumoniae NOT DETECTED NOT DETECTED Final   Proteus species NOT DETECTED NOT DETECTED Final   Salmonella species NOT DETECTED NOT DETECTED Final   Serratia marcescens NOT DETECTED NOT DETECTED Final   Haemophilus influenzae NOT DETECTED NOT DETECTED Final   Neisseria meningitidis NOT DETECTED NOT DETECTED Final   Pseudomonas aeruginosa NOT DETECTED NOT DETECTED Final   Stenotrophomonas maltophilia NOT DETECTED NOT DETECTED Final   Candida albicans NOT DETECTED NOT DETECTED Final   Candida auris NOT DETECTED NOT DETECTED Final   Candida glabrata NOT DETECTED NOT DETECTED Final   Candida krusei NOT DETECTED NOT DETECTED Final   Candida parapsilosis NOT DETECTED NOT DETECTED Final   Candida tropicalis NOT DETECTED NOT DETECTED Final   Cryptococcus neoformans/gattii NOT DETECTED NOT DETECTED Final    Comment: Performed at [facility], [address], [place], [place] [address]  Blood Culture (routine x 2)     Status: None (Preliminary result)   Collection Time: 12/24/23  7:36 AM   Specimen: BLOOD LEFT FOREARM  Result Value Ref Range Status   Specimen Description BLOOD LEFT FOREARM  Final   Special Requests   Final    BOTTLES DRAWN AEROBIC AND ANAEROBIC Blood Culture  results may not be optimal due to an inadequate volume of blood received in culture bottles   Culture   Final    NO GROWTH 3 DAYS Performed at [facility], [address] [place], [place] [address]    Report Status PENDING  Incomplete  MRSA Next Gen by PCR, Nasal     Status: None   Collection Time: 12/24/23  3:59 PM   Specimen: Nasal Mucosa; Nasal Swab  Result Value Ref Range Status   MRSA by PCR Next Gen NOT DETECTED NOT DETECTED Final    Comment: (NOTE) The GeneXpert MRSA Assay (FDA approved for NASAL specimens only), is one component of a comprehensive MRSA colonization surveillance program. It is not intended to diagnose MRSA infection nor to guide or monitor treatment for MRSA infections. Test performance is not FDA approved in patients less than 1 years old. Performed at [facility], [address] [place], [place] [address]   Culture, blood (Routine X 2) w Reflex to ID Panel     Status: None (Preliminary result)   Collection Time: 12/26/23  9:38 AM   Specimen: BLOOD RIGHT HAND  Result Value Ref Range Status   Specimen Description BLOOD RIGHT HAND  Final   Special Requests   Final    BOTTLES DRAWN AEROBIC AND ANAEROBIC Blood Culture adequate volume   Culture   Final    NO GROWTH 1 DAY Performed at [facility], [address], [name], [place] [address]    Report Status PENDING  Incomplete     Labs: BNP (last 3 results) Recent Labs    06/27/23 0558 12/03/23 1410 12/24/23 0647  BNP 63.1 241.3* 95.2  Basic Metabolic Panel: Recent Labs  Lab 12/24/23 0647 12/24/23 0856 12/25/23 0441 12/26/23 0538  NA 127* 130* 132* 136  K 3.8 3.5 4.0 3.6  CL 97*  --  103 105  CO2 19*  --  19* 23  GLUCOSE 147*  --  86 138*  BUN 18  --  20 27*  CREATININE 1.91*  --  2.27* 2.32*  CALCIUM  8.1*  --  8.2* 8.1*   Liver Function Tests: Recent Labs  Lab 12/24/23 0647  AST 24  ALT 10  ALKPHOS 95  BILITOT 0.8  PROT 6.4*  ALBUMIN  2.5*   No results  for input(s): LIPASE, AMYLASE in the last 168 hours. No results for input(s): AMMONIA in the last 168 hours. CBC: Recent Labs  Lab 12/24/23 0647 12/24/23 0856  WBC 11.2*  --   NEUTROABS 10.3*  --   HGB 11.7* 11.9*  HCT 34.6* 35.0*  MCV 86.3  --   PLT 142*  --    Cardiac Enzymes: No results for input(s): CKTOTAL, CKMB, CKMBINDEX, TROPONINI in the last 168 hours. BNP: Invalid input(s): POCBNP CBG: Recent Labs  Lab 12/26/23 2230 12/27/23 0925  GLUCAP 113* 90   D-Dimer No results for input(s): DDIMER in the last 72 hours. Hgb A1c No results for input(s): HGBA1C in the last 72 hours. Lipid Profile No results for input(s): CHOL, HDL, LDLCALC, TRIG, CHOLHDL, LDLDIRECT in the last 72 hours. Thyroid function studies No results for input(s): TSH, T4TOTAL, T3FREE, THYROIDAB in the last 72 hours.  Invalid input(s): FREET3 Anemia work up No results for input(s): VITAMINB12, FOLATE, FERRITIN, TIBC, IRON, RETICCTPCT in the last 72 hours. Urinalysis    Component Value Date/Time   COLORURINE YELLOW 12/24/2023 0812   APPEARANCEUR CLEAR 12/24/2023 0812   LABSPEC 1.011 12/24/2023 0812   PHURINE 6.0 12/24/2023 0812   GLUCOSEU NEGATIVE 12/24/2023 0812   HGBUR SMALL (A) 12/24/2023 0812   BILIRUBINUR NEGATIVE 12/24/2023 0812   KETONESUR NEGATIVE 12/24/2023 0812   PROTEINUR >=300 (A) 12/24/2023 0812   UROBILINOGEN 1.0 02/03/2014 0052   NITRITE NEGATIVE 12/24/2023 0812   LEUKOCYTESUR NEGATIVE 12/24/2023 0812   Sepsis Labs Recent Labs  Lab 12/24/23 0647  WBC 11.2*   Microbiology Recent Results (from the past 240 hours)  Resp panel by RT-PCR (RSV, Flu A&B, Covid) Anterior Nasal Swab     Status: None   Collection Time: 12/24/23  6:47 AM   Specimen: Anterior Nasal Swab  Result Value Ref Range Status   SARS Coronavirus 2 by RT PCR NEGATIVE NEGATIVE Final   Influenza A by PCR NEGATIVE NEGATIVE Final   Influenza B by PCR NEGATIVE  NEGATIVE Final    Comment: (NOTE) The Xpert Xpress SARS-CoV-2/FLU/RSV plus assay is intended as an aid in the diagnosis of influenza from Nasopharyngeal swab specimens and should not be used as a sole basis for treatment. Nasal washings and aspirates are unacceptable for Xpert Xpress SARS-CoV-2/FLU/RSV testing.  Fact Sheet for Patients: BloggerCourse.com  Fact Sheet for Healthcare Providers: SeriousBroker.it  This test is not yet approved or cleared by the [place]  FDA and has been authorized for detection and/or diagnosis of SARS-CoV-2 by FDA under an Emergency Use Authorization (EUA). This EUA will remain in effect (meaning this test can be used) for the duration of the COVID-19 declaration under Section 564(b)(1) of the Act, 21 U.S.C. section 360bbb-3(b)(1), unless the authorization is terminated or revoked.     Resp Syncytial Virus by PCR NEGATIVE NEGATIVE Final    Comment: (NOTE)  Fact Sheet for Patients: BloggerCourse.com  Fact Sheet for Healthcare Providers: SeriousBroker.it  This test is not yet approved or cleared by the [place]  FDA and has been authorized for detection and/or diagnosis of SARS-CoV-2 by FDA under an Emergency Use Authorization (EUA). This EUA will remain in effect (meaning this test can be used) for the duration of the COVID-19 declaration under Section 564(b)(1) of the Act, 21 U.S.C. section 360bbb-3(b)(1), unless the authorization is terminated or revoked.  Performed at [facility], [address], [place], [place] [address]   Blood Culture (routine x 2)     Status: Abnormal   Collection Time: 12/24/23  6:57 AM   Specimen: BLOOD  Result Value Ref Range Status   Specimen Description BLOOD SITE NOT SPECIFIED  Final   Special Requests   Final    BOTTLES DRAWN AEROBIC AND ANAEROBIC Blood Culture results may not be optimal due to an  inadequate volume of blood received in culture bottles   Culture  Setup Time   Final    GRAM POSITIVE COCCI IN CHAINS IN BOTH AEROBIC AND ANAEROBIC BOTTLES CRITICAL RESULT CALLED TO, READ BACK BY AND VERIFIED WITH: PHARMD C.AMEND [phone] @ 2043 FH Performed at [facility], [address], [place], [place] [address]    Culture GROUP B STREP(S.AGALACTIAE)ISOLATED (A)  Final   Report Status 12/26/2023 FINAL  Final   Organism ID, Bacteria GROUP B STREP(S.AGALACTIAE)ISOLATED  Final      Susceptibility   Group b strep(s.agalactiae)isolated - MIC*    CLINDAMYCIN  >=1 RESISTANT Resistant     AMPICILLIN  <=0.25 SENSITIVE Sensitive     ERYTHROMYCIN >=8 RESISTANT Resistant     VANCOMYCIN  0.5 SENSITIVE Sensitive     CEFTRIAXONE  <=0.12 SENSITIVE Sensitive     LEVOFLOXACIN  1 SENSITIVE Sensitive     PENICILLIN  <=0.06 SENSITIVE Sensitive     * GROUP B STREP(S.AGALACTIAE)ISOLATED  Blood Culture ID Panel (Reflexed)     Status: Abnormal   Collection Time: 12/24/23  6:57 AM  Result Value Ref Range Status   Enterococcus faecalis NOT DETECTED NOT DETECTED Final   Enterococcus Faecium NOT DETECTED NOT DETECTED Final   Listeria monocytogenes NOT DETECTED NOT DETECTED Final   Staphylococcus species NOT DETECTED NOT DETECTED Final   Staphylococcus aureus (BCID) NOT DETECTED NOT DETECTED Final   Staphylococcus epidermidis NOT DETECTED NOT DETECTED Final   Staphylococcus lugdunensis NOT DETECTED NOT DETECTED Final   Streptococcus species DETECTED (A) NOT DETECTED Final    Comment: CRITICAL RESULT CALLED TO, READ BACK BY AND VERIFIED WITH: PHARMD C.AMEND [phone] @ 2043 FH    Streptococcus agalactiae DETECTED (A) NOT DETECTED Final    Comment: CRITICAL RESULT CALLED TO, READ BACK BY AND VERIFIED WITH: PHARMD C.AMEND [phone] @ 2043 FH    Streptococcus pneumoniae NOT DETECTED NOT DETECTED Final   Streptococcus pyogenes NOT DETECTED NOT DETECTED Final   A.calcoaceticus-baumannii NOT DETECTED NOT DETECTED  Final   Bacteroides fragilis NOT DETECTED NOT DETECTED Final   Enterobacterales NOT DETECTED NOT DETECTED Final   Enterobacter cloacae complex NOT DETECTED NOT DETECTED Final   Escherichia coli NOT DETECTED NOT DETECTED Final   Klebsiella aerogenes NOT DETECTED NOT DETECTED Final   Klebsiella oxytoca NOT DETECTED NOT DETECTED Final   Klebsiella pneumoniae NOT DETECTED NOT DETECTED Final   Proteus species NOT DETECTED NOT DETECTED Final   Salmonella species NOT DETECTED NOT DETECTED Final   Serratia marcescens NOT DETECTED NOT DETECTED Final   Haemophilus influenzae NOT DETECTED NOT DETECTED  Final   Neisseria meningitidis NOT DETECTED NOT DETECTED Final   Pseudomonas aeruginosa NOT DETECTED NOT DETECTED Final   Stenotrophomonas maltophilia NOT DETECTED NOT DETECTED Final   Candida albicans NOT DETECTED NOT DETECTED Final   Candida auris NOT DETECTED NOT DETECTED Final   Candida glabrata NOT DETECTED NOT DETECTED Final   Candida krusei NOT DETECTED NOT DETECTED Final   Candida parapsilosis NOT DETECTED NOT DETECTED Final   Candida tropicalis NOT DETECTED NOT DETECTED Final   Cryptococcus neoformans/gattii NOT DETECTED NOT DETECTED Final    Comment: Performed at [facility], [address], [place], [place] [address]  Blood Culture (routine x 2)     Status: None (Preliminary result)   Collection Time: 12/24/23  7:36 AM   Specimen: BLOOD LEFT FOREARM  Result Value Ref Range Status   Specimen Description BLOOD LEFT FOREARM  Final   Special Requests   Final    BOTTLES DRAWN AEROBIC AND ANAEROBIC Blood Culture results may not be optimal due to an inadequate volume of blood received in culture bottles   Culture   Final    NO GROWTH 3 DAYS Performed at [facility], [address] [place], [place] [address]    Report Status PENDING  Incomplete  MRSA Next Gen by PCR, Nasal     Status: None   Collection Time: 12/24/23  3:59 PM   Specimen: Nasal Mucosa; Nasal Swab  Result  Value Ref Range Status   MRSA by PCR Next Gen NOT DETECTED NOT DETECTED Final    Comment: (NOTE) The GeneXpert MRSA Assay (FDA approved for NASAL specimens only), is one component of a comprehensive MRSA colonization surveillance program. It is not intended to diagnose MRSA infection nor to guide or monitor treatment for MRSA infections. Test performance is not FDA approved in patients less than 62 years old. Performed at [facility], [address], [place], [place] [address]   Culture, blood (Routine X 2) w Reflex to ID Panel     Status: None (Preliminary result)   Collection Time: 12/26/23  9:38 AM   Specimen: BLOOD RIGHT HAND  Result Value Ref Range Status   Specimen Description BLOOD RIGHT HAND  Final   Special Requests   Final    BOTTLES DRAWN AEROBIC AND ANAEROBIC Blood Culture adequate volume   Culture   Final    NO GROWTH 1 DAY Performed at [facility], [address], [place], [place] [address]    Report Status PENDING  Incomplete     Time coordinating discharge:  40 minutes  SIGNED:   Renato Applebaum, MD  [facility] 12/27/2023, 11:10 AM

## 2023-12-27 NOTE — Plan of Care (Signed)
  Problem: Education: Goal: Knowledge of General Education information will improve Description: Including pain rating scale, medication(s)/side effects and non-pharmacologic comfort measures Outcome: Progressing   Problem: Health Behavior/Discharge Planning: Goal: Ability to manage health-related needs will improve Outcome: Not Progressing   Problem: Clinical Measurements: Goal: Ability to maintain clinical measurements within normal limits will improve Outcome: Progressing Goal: Will remain free from infection Outcome: Progressing Goal: Diagnostic test results will improve Outcome: Progressing Goal: Respiratory complications will improve Outcome: Progressing Goal: Cardiovascular complication will be avoided Outcome: Progressing   Problem: Activity: Goal: Risk for activity intolerance will decrease Outcome: Progressing   Problem: Nutrition: Goal: Adequate nutrition will be maintained Outcome: Progressing   Problem: Elimination: Goal: Will not experience complications related to bowel motility Outcome: Progressing Goal: Will not experience complications related to urinary retention Outcome: Progressing   Problem: Pain Managment: Goal: General experience of comfort will improve and/or be controlled Outcome: Progressing   Problem: Safety: Goal: Ability to remain free from injury will improve Outcome: Progressing   Problem: Skin Integrity: Goal: Risk for impaired skin integrity will decrease Outcome: Progressing   Problem: Coping: Goal: Ability to adjust to condition or change in health will improve Outcome: Progressing   Problem: Fluid Volume: Goal: Ability to maintain a balanced intake and output will improve Outcome: Progressing   Problem: Health Behavior/Discharge Planning: Goal: Ability to identify and utilize available resources and services will improve Outcome: Progressing Goal: Ability to manage health-related needs will improve Outcome: Progressing    Problem: Metabolic: Goal: Ability to maintain appropriate glucose levels will improve Outcome: Progressing   Problem: Nutritional: Goal: Maintenance of adequate nutrition will improve Outcome: Progressing Goal: Progress toward achieving an optimal weight will improve Outcome: Progressing   Problem: Skin Integrity: Goal: Risk for impaired skin integrity will decrease Outcome: Progressing   Problem: Tissue Perfusion: Goal: Adequacy of tissue perfusion will improve Outcome: Progressing

## 2023-12-29 LAB — CULTURE, BLOOD (ROUTINE X 2): Culture: NO GROWTH

## 2023-12-31 LAB — CULTURE, BLOOD (ROUTINE X 2)
Culture: NO GROWTH
Special Requests: ADEQUATE

## 2024-02-21 ENCOUNTER — Emergency Department (HOSPITAL_COMMUNITY)

## 2024-02-21 ENCOUNTER — Inpatient Hospital Stay (HOSPITAL_COMMUNITY)
Admission: EM | Admit: 2024-02-21 | Discharge: 2024-03-01 | DRG: 871 | Disposition: A | Discharge status: Skilled Nursing Facility | Attending: Internal Medicine | Admitting: Internal Medicine

## 2024-02-21 DIAGNOSIS — E1122 Type 2 diabetes mellitus with diabetic chronic kidney disease: Secondary | ICD-10-CM | POA: Diagnosis present

## 2024-02-21 DIAGNOSIS — G9340 Encephalopathy, unspecified: Secondary | ICD-10-CM | POA: Diagnosis present

## 2024-02-21 DIAGNOSIS — M869 Osteomyelitis, unspecified: Secondary | ICD-10-CM | POA: Diagnosis present

## 2024-02-21 DIAGNOSIS — A419 Sepsis, unspecified organism: Secondary | ICD-10-CM | POA: Diagnosis present

## 2024-02-21 DIAGNOSIS — Z7982 Long term (current) use of aspirin: Secondary | ICD-10-CM

## 2024-02-21 DIAGNOSIS — E785 Hyperlipidemia, unspecified: Secondary | ICD-10-CM | POA: Diagnosis not present

## 2024-02-21 DIAGNOSIS — Z794 Long term (current) use of insulin: Secondary | ICD-10-CM | POA: Diagnosis not present

## 2024-02-21 DIAGNOSIS — R198 Other specified symptoms and signs involving the digestive system and abdomen: Secondary | ICD-10-CM

## 2024-02-21 DIAGNOSIS — Z809 Family history of malignant neoplasm, unspecified: Secondary | ICD-10-CM

## 2024-02-21 DIAGNOSIS — Z8616 Personal history of COVID-19: Secondary | ICD-10-CM | POA: Diagnosis not present

## 2024-02-21 DIAGNOSIS — N1832 Chronic kidney disease, stage 3b: Secondary | ICD-10-CM | POA: Diagnosis present

## 2024-02-21 DIAGNOSIS — L03116 Cellulitis of left lower limb: Secondary | ICD-10-CM | POA: Diagnosis present

## 2024-02-21 DIAGNOSIS — E119 Type 2 diabetes mellitus without complications: Secondary | ICD-10-CM

## 2024-02-21 DIAGNOSIS — R6520 Severe sepsis without septic shock: Secondary | ICD-10-CM | POA: Diagnosis present

## 2024-02-21 DIAGNOSIS — D631 Anemia in chronic kidney disease: Secondary | ICD-10-CM | POA: Diagnosis present

## 2024-02-21 DIAGNOSIS — Z85820 Personal history of malignant melanoma of skin: Secondary | ICD-10-CM

## 2024-02-21 DIAGNOSIS — Z8673 Personal history of transient ischemic attack (TIA), and cerebral infarction without residual deficits: Secondary | ICD-10-CM

## 2024-02-21 DIAGNOSIS — E1169 Type 2 diabetes mellitus with other specified complication: Secondary | ICD-10-CM | POA: Diagnosis present

## 2024-02-21 DIAGNOSIS — I130 Hypertensive heart and chronic kidney disease with heart failure and stage 1 through stage 4 chronic kidney disease, or unspecified chronic kidney disease: Secondary | ICD-10-CM | POA: Diagnosis present

## 2024-02-21 DIAGNOSIS — N179 Acute kidney failure, unspecified: Secondary | ICD-10-CM | POA: Diagnosis present

## 2024-02-21 DIAGNOSIS — F411 Generalized anxiety disorder: Secondary | ICD-10-CM | POA: Diagnosis not present

## 2024-02-21 DIAGNOSIS — I10 Essential (primary) hypertension: Secondary | ICD-10-CM | POA: Diagnosis not present

## 2024-02-21 DIAGNOSIS — R6521 Severe sepsis with septic shock: Secondary | ICD-10-CM

## 2024-02-21 DIAGNOSIS — L0390 Cellulitis, unspecified: Secondary | ICD-10-CM

## 2024-02-21 DIAGNOSIS — K219 Gastro-esophageal reflux disease without esophagitis: Secondary | ICD-10-CM | POA: Diagnosis present

## 2024-02-21 DIAGNOSIS — E872 Acidosis, unspecified: Secondary | ICD-10-CM | POA: Diagnosis present

## 2024-02-21 DIAGNOSIS — Z89412 Acquired absence of left great toe: Secondary | ICD-10-CM

## 2024-02-21 DIAGNOSIS — J449 Chronic obstructive pulmonary disease, unspecified: Secondary | ICD-10-CM | POA: Diagnosis present

## 2024-02-21 DIAGNOSIS — Z8042 Family history of malignant neoplasm of prostate: Secondary | ICD-10-CM

## 2024-02-21 DIAGNOSIS — Z808 Family history of malignant neoplasm of other organs or systems: Secondary | ICD-10-CM

## 2024-02-21 DIAGNOSIS — Z85828 Personal history of other malignant neoplasm of skin: Secondary | ICD-10-CM

## 2024-02-21 DIAGNOSIS — Z932 Ileostomy status: Secondary | ICD-10-CM | POA: Diagnosis not present

## 2024-02-21 DIAGNOSIS — Z6841 Body Mass Index (BMI) 40.0 and over, adult: Secondary | ICD-10-CM

## 2024-02-21 DIAGNOSIS — Z1152 Encounter for screening for COVID-19: Secondary | ICD-10-CM | POA: Diagnosis not present

## 2024-02-21 DIAGNOSIS — R0902 Hypoxemia: Secondary | ICD-10-CM | POA: Diagnosis not present

## 2024-02-21 DIAGNOSIS — F32A Depression, unspecified: Secondary | ICD-10-CM | POA: Diagnosis present

## 2024-02-21 DIAGNOSIS — E66813 Obesity, class 3: Secondary | ICD-10-CM | POA: Diagnosis present

## 2024-02-21 DIAGNOSIS — Z833 Family history of diabetes mellitus: Secondary | ICD-10-CM

## 2024-02-21 DIAGNOSIS — Z79899 Other long term (current) drug therapy: Secondary | ICD-10-CM

## 2024-02-21 DIAGNOSIS — G894 Chronic pain syndrome: Secondary | ICD-10-CM | POA: Diagnosis not present

## 2024-02-21 DIAGNOSIS — I4891 Unspecified atrial fibrillation: Secondary | ICD-10-CM | POA: Diagnosis present

## 2024-02-21 DIAGNOSIS — F329 Major depressive disorder, single episode, unspecified: Secondary | ICD-10-CM | POA: Diagnosis not present

## 2024-02-21 DIAGNOSIS — I5032 Chronic diastolic (congestive) heart failure: Secondary | ICD-10-CM | POA: Diagnosis present

## 2024-02-21 DIAGNOSIS — E875 Hyperkalemia: Secondary | ICD-10-CM | POA: Diagnosis present

## 2024-02-21 DIAGNOSIS — F05 Delirium due to known physiological condition: Secondary | ICD-10-CM | POA: Diagnosis not present

## 2024-02-21 DIAGNOSIS — G9009 Other idiopathic peripheral autonomic neuropathy: Secondary | ICD-10-CM | POA: Diagnosis present

## 2024-02-21 DIAGNOSIS — R4182 Altered mental status, unspecified: Secondary | ICD-10-CM | POA: Diagnosis present

## 2024-02-21 DIAGNOSIS — M6289 Other specified disorders of muscle: Secondary | ICD-10-CM | POA: Diagnosis not present

## 2024-02-21 DIAGNOSIS — J439 Emphysema, unspecified: Secondary | ICD-10-CM | POA: Diagnosis not present

## 2024-02-21 DIAGNOSIS — N170 Acute kidney failure with tubular necrosis: Secondary | ICD-10-CM | POA: Diagnosis not present

## 2024-02-21 DIAGNOSIS — Z8659 Personal history of other mental and behavioral disorders: Secondary | ICD-10-CM | POA: Diagnosis not present

## 2024-02-21 DIAGNOSIS — K5900 Constipation, unspecified: Secondary | ICD-10-CM | POA: Diagnosis present

## 2024-02-21 DIAGNOSIS — Z781 Physical restraint status: Secondary | ICD-10-CM

## 2024-02-21 DIAGNOSIS — G928 Other toxic encephalopathy: Secondary | ICD-10-CM | POA: Diagnosis present

## 2024-02-21 DIAGNOSIS — Z89611 Acquired absence of right leg above knee: Secondary | ICD-10-CM | POA: Diagnosis not present

## 2024-02-21 DIAGNOSIS — F17210 Nicotine dependence, cigarettes, uncomplicated: Secondary | ICD-10-CM | POA: Diagnosis present

## 2024-02-21 DIAGNOSIS — N319 Neuromuscular dysfunction of bladder, unspecified: Secondary | ICD-10-CM | POA: Diagnosis present

## 2024-02-21 DIAGNOSIS — Z8249 Family history of ischemic heart disease and other diseases of the circulatory system: Secondary | ICD-10-CM

## 2024-02-21 DIAGNOSIS — Z933 Colostomy status: Secondary | ICD-10-CM | POA: Diagnosis not present

## 2024-02-21 DIAGNOSIS — Z91040 Latex allergy status: Secondary | ICD-10-CM

## 2024-02-21 DIAGNOSIS — R410 Disorientation, unspecified: Secondary | ICD-10-CM | POA: Diagnosis not present

## 2024-02-21 DIAGNOSIS — E1165 Type 2 diabetes mellitus with hyperglycemia: Secondary | ICD-10-CM | POA: Diagnosis not present

## 2024-02-21 DIAGNOSIS — F40240 Claustrophobia: Secondary | ICD-10-CM | POA: Diagnosis present

## 2024-02-21 LAB — CBC WITH DIFFERENTIAL/PLATELET
Abs Immature Granulocytes: 0.09 K/uL — ABNORMAL HIGH (ref 0.00–0.07)
Basophils Absolute: 0.1 K/uL (ref 0.0–0.1)
Basophils Relative: 0 %
Eosinophils Absolute: 0.3 K/uL (ref 0.0–0.5)
Eosinophils Relative: 2 %
HCT: 39.8 % (ref 39.0–52.0)
Hemoglobin: 12.3 g/dL — ABNORMAL LOW (ref 13.0–17.0)
Immature Granulocytes: 1 %
Lymphocytes Relative: 4 %
Lymphs Abs: 0.7 K/uL (ref 0.7–4.0)
MCH: 28.7 pg (ref 26.0–34.0)
MCHC: 30.9 g/dL (ref 30.0–36.0)
MCV: 92.8 fL (ref 80.0–100.0)
Monocytes Absolute: 0.6 K/uL (ref 0.1–1.0)
Monocytes Relative: 4 %
Neutro Abs: 14.8 K/uL — ABNORMAL HIGH (ref 1.7–7.7)
Neutrophils Relative %: 89 %
Platelets: 241 K/uL (ref 150–400)
RBC: 4.29 MIL/uL (ref 4.22–5.81)
RDW: 15.5 % (ref 11.5–15.5)
WBC: 16.6 K/uL — ABNORMAL HIGH (ref 4.0–10.5)
nRBC: 0 % (ref 0.0–0.2)

## 2024-02-21 LAB — SEDIMENTATION RATE: Sed Rate: 69 mm/h — ABNORMAL HIGH (ref 0–16)

## 2024-02-21 LAB — URINALYSIS, W/ REFLEX TO CULTURE (INFECTION SUSPECTED)
Bilirubin Urine: NEGATIVE
Glucose, UA: NEGATIVE mg/dL
Ketones, ur: NEGATIVE mg/dL
Leukocytes,Ua: NEGATIVE
Nitrite: NEGATIVE
Protein, ur: >=300 mg/dL — AB
Specific Gravity, Urine: 1.01 (ref 1.005–1.030)
pH: 5 (ref 5.0–8.0)

## 2024-02-21 LAB — COMPREHENSIVE METABOLIC PANEL WITH GFR
ALT: 16 U/L (ref 0–44)
AST: 19 U/L (ref 15–41)
Albumin: 2.9 g/dL — ABNORMAL LOW (ref 3.5–5.0)
Alkaline Phosphatase: 84 U/L (ref 38–126)
Anion gap: 11 (ref 5–15)
BUN: 26 mg/dL — ABNORMAL HIGH (ref 8–23)
CO2: 23 mmol/L (ref 22–32)
Calcium: 8.8 mg/dL — ABNORMAL LOW (ref 8.9–10.3)
Chloride: 105 mmol/L (ref 98–111)
Creatinine, Ser: 2.13 mg/dL — ABNORMAL HIGH (ref 0.61–1.24)
GFR, Estimated: 34 mL/min — ABNORMAL LOW (ref >60)
Glucose, Bld: 131 mg/dL — ABNORMAL HIGH (ref 70–99)
Potassium: 5.5 mmol/L — ABNORMAL HIGH (ref 3.5–5.1)
Sodium: 139 mmol/L (ref 135–145)
Total Bilirubin: 0.8 mg/dL (ref 0.0–1.2)
Total Protein: 6.8 g/dL (ref 6.5–8.1)

## 2024-02-21 LAB — CBC
HCT: 34.9 % — ABNORMAL LOW (ref 39.0–52.0)
Hemoglobin: 11.1 g/dL — ABNORMAL LOW (ref 13.0–17.0)
MCH: 29.3 pg (ref 26.0–34.0)
MCHC: 31.8 g/dL (ref 30.0–36.0)
MCV: 92.1 fL (ref 80.0–100.0)
Platelets: 198 K/uL (ref 150–400)
RBC: 3.79 MIL/uL — ABNORMAL LOW (ref 4.22–5.81)
RDW: 15.5 % (ref 11.5–15.5)
WBC: 18.4 K/uL — ABNORMAL HIGH (ref 4.0–10.5)
nRBC: 0 % (ref 0.0–0.2)

## 2024-02-21 LAB — RESP PANEL BY RT-PCR (RSV, FLU A&B, COVID)  RVPGX2
Influenza A by PCR: NEGATIVE
Influenza B by PCR: NEGATIVE
Resp Syncytial Virus by PCR: NEGATIVE
SARS Coronavirus 2 by RT PCR: NEGATIVE

## 2024-02-21 LAB — C-REACTIVE PROTEIN: CRP: 11.8 mg/dL — ABNORMAL HIGH (ref <1.0)

## 2024-02-21 LAB — CREATININE, SERUM
Creatinine, Ser: 1.99 mg/dL — ABNORMAL HIGH (ref 0.61–1.24)
GFR, Estimated: 37 mL/min — ABNORMAL LOW (ref >60)

## 2024-02-21 LAB — I-STAT CG4 LACTIC ACID, ED
Lactic Acid, Venous: 2 mmol/L (ref 0.5–1.9)
Lactic Acid, Venous: 2 mmol/L (ref 0.5–1.9)

## 2024-02-21 LAB — PROTIME-INR
INR: 1.1 (ref 0.8–1.2)
Prothrombin Time: 14.4 s (ref 11.4–15.2)

## 2024-02-21 MED ORDER — TAMSULOSIN HCL 0.4 MG PO CAPS
0.4000 mg | ORAL_CAPSULE | Freq: Every day | ORAL | Status: DC
  Administered 2024-02-21 – 2024-03-01 (×8): 0.4 mg via ORAL
  Filled 2024-02-21 (×10): qty 1

## 2024-02-21 MED ORDER — OXYCODONE HCL 5 MG PO TABS
10.0000 mg | ORAL_TABLET | ORAL | Status: DC | PRN
  Administered 2024-02-22 – 2024-02-23 (×8): 10 mg via ORAL
  Filled 2024-02-21 (×10): qty 2

## 2024-02-21 MED ORDER — MIDAZOLAM HCL 2 MG/2ML IJ SOLN
2.0000 mg | Freq: Once | INTRAMUSCULAR | Status: AC
  Administered 2024-02-21: 2 mg via INTRAVENOUS
  Filled 2024-02-21: qty 2

## 2024-02-21 MED ORDER — ESCITALOPRAM OXALATE 10 MG PO TABS
5.0000 mg | ORAL_TABLET | Freq: Every day | ORAL | Status: DC
  Administered 2024-02-22 – 2024-03-01 (×7): 5 mg via ORAL
  Filled 2024-02-21 (×9): qty 1

## 2024-02-21 MED ORDER — ATORVASTATIN CALCIUM 80 MG PO TABS
80.0000 mg | ORAL_TABLET | Freq: Every day | ORAL | Status: DC
  Administered 2024-02-21 – 2024-03-01 (×8): 80 mg via ORAL
  Filled 2024-02-21 (×11): qty 1

## 2024-02-21 MED ORDER — VANCOMYCIN HCL 2000 MG/400ML IV SOLN
2000.0000 mg | Freq: Once | INTRAVENOUS | Status: AC
  Administered 2024-02-21: 2000 mg via INTRAVENOUS
  Filled 2024-02-21 (×2): qty 400

## 2024-02-21 MED ORDER — SODIUM CHLORIDE 0.9 % IV SOLN
2.0000 g | Freq: Two times a day (BID) | INTRAVENOUS | Status: DC
  Administered 2024-02-21 – 2024-02-25 (×7): 2 g via INTRAVENOUS
  Filled 2024-02-21 (×8): qty 12.5

## 2024-02-21 MED ORDER — LACTATED RINGERS IV SOLN: INTRAVENOUS | Status: AC

## 2024-02-21 MED ORDER — FENTANYL CITRATE PF 50 MCG/ML IJ SOSY
50.0000 ug | PREFILLED_SYRINGE | Freq: Once | INTRAMUSCULAR | Status: AC
  Administered 2024-02-21: 50 ug via INTRAVENOUS
  Filled 2024-02-21: qty 1

## 2024-02-21 MED ORDER — OXYCODONE HCL 20 MG/ML PO CONC: 10.0000 mg | ORAL | Status: DC | PRN

## 2024-02-21 MED ORDER — MIDAZOLAM HCL 2 MG/2ML IJ SOLN
1.0000 mg | Freq: Once | INTRAMUSCULAR | Status: AC
  Administered 2024-02-21: 1 mg via INTRAVENOUS

## 2024-02-21 MED ORDER — LEVETIRACETAM 500 MG PO TABS
500.0000 mg | ORAL_TABLET | Freq: Two times a day (BID) | ORAL | Status: DC
  Administered 2024-02-21 – 2024-02-25 (×7): 500 mg via ORAL
  Filled 2024-02-21 (×9): qty 1

## 2024-02-21 MED ORDER — VANCOMYCIN HCL 1750 MG/350ML IV SOLN: 1750.0000 mg | INTRAVENOUS | Status: DC

## 2024-02-21 MED ORDER — SODIUM CHLORIDE 0.9 % IV SOLN
2.0000 g | Freq: Once | INTRAVENOUS | Status: AC
  Administered 2024-02-21: 2 g via INTRAVENOUS
  Filled 2024-02-21: qty 12.5

## 2024-02-21 MED ORDER — ENOXAPARIN SODIUM 40 MG/0.4ML IJ SOSY
40.0000 mg | PREFILLED_SYRINGE | INTRAMUSCULAR | Status: DC
  Administered 2024-02-21: 40 mg via SUBCUTANEOUS
  Filled 2024-02-21: qty 0.4

## 2024-02-21 MED ORDER — ACETAMINOPHEN 500 MG PO TABS: 1000.0000 mg | ORAL_TABLET | Freq: Once | ORAL | Status: DC

## 2024-02-21 MED ORDER — VANCOMYCIN HCL IN DEXTROSE 1-5 GM/200ML-% IV SOLN: 1000.0000 mg | Freq: Once | INTRAVENOUS | Status: DC

## 2024-02-21 MED ORDER — ASPIRIN 81 MG PO TBEC
81.0000 mg | DELAYED_RELEASE_TABLET | Freq: Every day | ORAL | Status: DC
  Administered 2024-02-21 – 2024-03-01 (×8): 81 mg via ORAL
  Filled 2024-02-21 (×10): qty 1

## 2024-02-21 MED ORDER — ZINC OXIDE 40 % EX OINT
TOPICAL_OINTMENT | CUTANEOUS | Status: AC
  Filled 2024-02-21: qty 57

## 2024-02-21 MED ORDER — SODIUM CHLORIDE 0.9 % IV BOLUS (SEPSIS)
1000.0000 mL | Freq: Once | INTRAVENOUS | Status: AC
  Administered 2024-02-21: 1000 mL via INTRAVENOUS

## 2024-02-21 MED ORDER — HALOPERIDOL LACTATE 5 MG/ML IJ SOLN
2.0000 mg | Freq: Once | INTRAMUSCULAR | Status: AC
  Administered 2024-02-21: 2 mg via INTRAVENOUS
  Filled 2024-02-21: qty 1

## 2024-02-21 MED ORDER — ZIPRASIDONE MESYLATE 20 MG IM SOLR
10.0000 mg | Freq: Once | INTRAMUSCULAR | Status: AC
  Administered 2024-02-21: 10 mg via INTRAMUSCULAR
  Filled 2024-02-21: qty 20

## 2024-02-21 MED ORDER — OXCARBAZEPINE 150 MG PO TABS
150.0000 mg | ORAL_TABLET | Freq: Two times a day (BID) | ORAL | Status: DC
  Administered 2024-02-21 – 2024-03-01 (×15): 150 mg via ORAL
  Filled 2024-02-21 (×20): qty 1

## 2024-02-21 MED ORDER — LACTULOSE 10 GM/15ML PO SOLN
20.0000 g | Freq: Two times a day (BID) | ORAL | Status: DC
  Administered 2024-02-21 – 2024-02-28 (×5): 20 g via ORAL
  Filled 2024-02-21 (×17): qty 30

## 2024-02-21 MED ORDER — ACETAMINOPHEN 650 MG RE SUPP
650.0000 mg | Freq: Once | RECTAL | Status: AC
  Administered 2024-02-21: 650 mg via RECTAL
  Filled 2024-02-21: qty 1

## 2024-02-21 MED ORDER — MIDAZOLAM HCL 2 MG/2ML IJ SOLN
1.0000 mg | Freq: Once | INTRAMUSCULAR | Status: AC
  Administered 2024-02-21: 1 mg via INTRAVENOUS
  Filled 2024-02-21: qty 2

## 2024-02-21 NOTE — ED Provider Notes (Signed)
 [name]   CSN: [id number] Arrival date & time: 02/21/24  9150     Patient presents with: Altered Mental Status   Gregory [name] is a 63 y.o. male.   Patient here with bodyaches fever confusion.  History of hypertension COPD diabetes CHF stroke BKA.  Seems like maybe symptoms started overnight.  Just admitted for similar infectious process recently.  He is having pain all over body aches all over.  Has had a cough.  Denies any pain with urination.  Denies any abdominal pain.  History somewhat limited as patient not feeling well.  The history is provided by the patient.       Prior to Admission medications   Medication Sig Start Date End Date Taking? Authorizing Provider  acetaminophen  (TYLENOL ) 325 MG tablet Take 2 tablets (650 mg total) by mouth every 6 (six) hours as needed for mild pain (pain score 1-3) (or Fever >/= 101). 09/08/23   Elgergawy, Brayton RAMAN, MD  aspirin  EC 81 MG tablet Take 81 mg by mouth daily.    [provider]  atorvastatin  (LIPITOR ) 80 MG tablet Take 1 tablet (80 mg total) by mouth daily. 09/22/22 01/27/24  Cindy Garnette POUR, MD  busPIRone  (BUSPAR ) 5 MG tablet Take 5 mg by mouth in the morning and at bedtime.    [provider]  Cholecalciferol  25 MCG (1000 UT) capsule Take 2,000 Units by mouth daily.    [provider]  docusate sodium  (COLACE) 100 MG capsule Take 100 mg by mouth every 12 (twelve) hours.    [provider]  Dulaglutide  0.75 MG/0.5ML SOAJ Inject 0.75 mg into the skin every Thursday.    [provider]  escitalopram  (LEXAPRO ) 5 MG tablet Take 5 mg by mouth See admin instructions. Take 5 mg by mouth at 7 AM for 14 days, then stop 12/20/23 01/03/24  [provider]  ferrous sulfate  325 (65 FE) MG tablet Take 1 tablet (325 mg total) by mouth 3 (three) times daily with meals. Patient taking differently: Take 325 mg by mouth daily with breakfast.  01/23/23   Bernard Drivers, MD  insulin  lispro (HUMALOG ) 100 UNIT/ML KwikPen Inject 0-10 Units into the skin See admin instructions. Inject 0-10 units into the skin three times a day and at bedtime, PER SLIDING SCALE:  BGL 151-200 = 2 units 201-250 = 4 units 251-300 = 6 units 301-350 =  8 units 351-400 = 10 units Patient taking differently: Inject 0-10 Units into the skin See admin instructions. Inject 0-10 units into the skin before meals and at bedtime, PER SLIDING SCALE: BGL 0-150 = give nothing; 151-200 = 2 units; 201-250 = 4 units; 251-300 = 6 units; 301-350 =  8 units; 351-400 = 10 units 01/31/22   Dahal, Chapman, MD  ipratropium-albuterol  (DUONEB) 0.5-2.5 (3) MG/3ML SOLN Take 3 mLs by nebulization every 6 (six) hours as needed (shortness of breath, wheezing).    [provider]  lactulose  (CHRONULAC ) 10 GM/15ML solution Take 30 mLs (20 g total) by mouth 2 (two) times daily. 08/19/23   Samtani, Jai-Gurmukh, MD  levETIRAcetam  (KEPPRA ) 500 MG tablet Take 1 tablet (500 mg total) by mouth 2 (two) times daily. 12/03/23 03/02/24  Jerrol Agent, MD  lidocaine  4 % Place 1 patch onto the skin See admin instructions. Apply 1 new patch to the affected area and remove 12 hours later    [provider]  magnesium  oxide (MAG-OX) 400 (240 Mg) MG  tablet Take 400 mg by mouth 3 (three) times daily.    [provider]  Melatonin 10 MG TABS Take 10 mg by mouth at bedtime.    [provider]  Meth-Hyo-M Bl-Na Phos-Ph Sal (URO-MP) 118 MG CAPS Take 118 mg by mouth 2 (two) times daily.    [provider]  methocarbamol  (ROBAXIN ) 500 MG tablet Take 500 mg by mouth 3 (three) times daily.    [provider]  Multiple Vitamins-Minerals (MULTIVITAMIN WITH MINERALS) tablet Take 1 tablet by mouth daily.    [provider]  naloxone  (NARCAN ) 0.4 MG/ML injection Inject 0.4 mg into the muscle as needed (for oversedation).    [provider]  naloxone  (NARCAN )  nasal spray 4 mg/0.1 mL Place 1 spray into the nose See admin instructions. Instill 1 spray into alternating nostrils every 5 minutes as needed for an opiate overdose    [provider]  omeprazole (PRILOSEC) 20 MG capsule Take 20 mg by mouth at bedtime.    [provider]  ondansetron  (ZOFRAN ) 4 MG tablet Take 4 mg by mouth every 6 (six) hours as needed for nausea or vomiting.    [provider]  OXcarbazepine  (TRILEPTAL ) 150 MG tablet Take 1 tablet (150 mg total) by mouth 2 (two) times daily. 08/19/23   Samtani, Jai-Gurmukh, MD  oxyCODONE  (ROXICODONE  INTENSOL) 20 MG/ML concentrated solution Take 10-15 mg by mouth See admin instructions. Give 15 mg by mouth at 9 AM, 1 PM, 5 PM, and 9 PM and an additional 10 mg every six hours as needed for pain    [provider]  pregabalin  (LYRICA ) 200 MG capsule Take 200 mg by mouth 2 (two) times daily.    [provider]  prochlorperazine  (COMPAZINE ) 5 MG tablet Take 5 mg by mouth every 6 (six) hours as needed for nausea or vomiting.    [provider]  PROTEIN PO Take 30 mLs by mouth 2 (two) times daily.    [provider]  senna (SENOKOT) 8.6 MG TABS tablet Take 2 tablets by mouth at bedtime.    [provider]  sodium bicarbonate  650 MG tablet Take 650 mg by mouth 2 (two) times daily.    [provider]  tamsulosin  (FLOMAX ) 0.4 MG CAPS capsule Take 1 capsule (0.4 mg total) by mouth daily. 08/19/23   Samtani, Jai-Gurmukh, MD    Allergies: Latex    Review of Systems  Updated Vital Signs BP (!) 149/94 (BP Location: Left Arm)   Pulse (!) 124   Temp (!) 104.6 F (40.3 C) (Rectal)   Resp (!) 22   Wt 111.1 kg   SpO2 96%   BMI 40.77 kg/m   Physical Exam Vitals and nursing note reviewed.  Constitutional:      General: He is in acute distress.     Appearance: He is well-developed. He is ill-appearing.  HENT:     Head: Normocephalic and atraumatic.     Nose: Nose normal.      Mouth/Throat:     Mouth: Mucous membranes are moist.  Eyes:     Extraocular Movements: Extraocular movements intact.     Conjunctiva/sclera: Conjunctivae normal.     Pupils: Pupils are equal, round, and reactive to light.  Cardiovascular:     Rate and Rhythm: Regular rhythm. Tachycardia present.     Pulses: Normal pulses.     Heart sounds: No murmur heard. Pulmonary:     Effort: Pulmonary effort is normal. No respiratory distress.  Breath sounds: Normal breath sounds.  Abdominal:     Palpations: Abdomen is soft.     Tenderness: There is no abdominal tenderness.     Comments: Ostomy in place with good stool output  Musculoskeletal:        General: No swelling.     Cervical back: Normal range of motion and neck supple.  Skin:    General: Skin is warm and dry.     Capillary Refill: Capillary refill takes less than 2 seconds.     Comments: Discussed some redness and warmth to his chronic lymphedema site in his left lower extremity, there is some redness to the left upper thigh as well  Neurological:     General: No focal deficit present.     Mental Status: He is alert.  Psychiatric:        Mood and Affect: Mood normal.     (all labs ordered are listed, but only abnormal results are displayed) Labs Reviewed  COMPREHENSIVE METABOLIC PANEL WITH GFR - Abnormal; Notable for the following components:      Result Value   Potassium 5.5 (*)    Glucose, Bld 131 (*)    BUN 26 (*)    Creatinine, Ser 2.13 (*)    Calcium  8.8 (*)    Albumin  2.9 (*)    GFR, Estimated 34 (*)    All other components within normal limits  CBC WITH DIFFERENTIAL/PLATELET - Abnormal; Notable for the following components:   WBC 16.6 (*)    Hemoglobin 12.3 (*)    Neutro Abs 14.8 (*)    Abs Immature Granulocytes 0.09 (*)    All other components within normal limits  I-STAT CG4 LACTIC ACID, ED - Abnormal; Notable for the following components:   Lactic Acid, Venous 2.0 (*)    All other components within  normal limits  RESP PANEL BY RT-PCR (RSV, FLU A&B, COVID)  RVPGX2  CULTURE, BLOOD (ROUTINE X 2)  CULTURE, BLOOD (ROUTINE X 2)  URINALYSIS, W/ REFLEX TO CULTURE (INFECTION SUSPECTED)  PROTIME-INR    EKG: EKG Interpretation Date/Time:  Friday February 21 2024 08:59:38 EDT Ventricular Rate:  123 PR Interval:    QRS Duration:  98 QT Interval:  335 QTC Calculation: 480 R Axis:   98  Text Interpretation: Sinus tachycardia Right axis deviation Confirmed by Ruthe Cornet [phone]) on 02/21/2024 9:00:24 AM  Radiology: [facility] Chest Port 1 View Result Date: 02/21/2024 CLINICAL DATA:  Altered mental status. EXAM: PORTABLE CHEST 1 VIEW COMPARISON:  December 24, 2023. FINDINGS: Stable cardiomediastinal silhouette. Mild central pulmonary vascular congestion is noted. No consolidative process is noted. Bony thorax is unremarkable. IMPRESSION: Mild central pulmonary vascular congestion. Electronically Signed   By: Lynwood Landy Raddle M.D.   On: 02/21/2024 10:17     .Critical Care  Performed by: Ruthe Cornet, DO Authorized by: Ruthe Cornet, DO   Critical care provider statement:    Critical care time (minutes):  35   Critical care was necessary to treat or prevent imminent or life-threatening deterioration of the following conditions:  Sepsis   Critical care was time spent personally by me on the following activities:  Blood draw for specimens, development of treatment plan with patient or surrogate, discussions with primary provider, evaluation of patient's response to treatment, examination of patient, obtaining history from patient or surrogate, ordering and performing treatments and interventions, ordering and review of laboratory studies, ordering and review of radiographic studies, re-evaluation of patient's condition, pulse oximetry and review of old charts  Care discussed with: admitting provider      Medications Ordered in the ED  lactated ringers  infusion (has no administration in time range)   sodium chloride  0.9 % bolus 1,000 mL (1,000 mLs Intravenous New Bag/Given 02/21/24 0940)  vancomycin  (VANCOREADY) IVPB 2000 mg/400 mL (has no administration in time range)  ceFEPIme  (MAXIPIME ) 2 g in sodium chloride  0.9 % 100 mL IVPB (0 g Intravenous Stopped 02/21/24 1019)  acetaminophen  (TYLENOL ) suppository 650 mg (650 mg Rectal Given 02/21/24 0947)  midazolam  (VERSED ) injection 1 mg (1 mg Intravenous Given 02/21/24 1019)  midazolam  (VERSED ) injection 1 mg (1 mg Intravenous Given 02/21/24 1020)  midazolam  (VERSED ) injection 2 mg (2 mg Intravenous Given 02/21/24 1027)                                    Medical Decision Making Amount and/or Complexity of Data Reviewed Labs: ordered. Radiology: ordered.  Risk OTC drugs. Prescription drug management. Decision regarding hospitalization.   Gregory [name] is here with bodyaches and fever.  Patient febrile to 104.6.  Heart rate 124.  He does look like he has some sort of cellulitis maybe to the left lower extremity.  Looks like when he was just admitted he had 1 out of 2 strep bacteremia.  Thought to be from lymphedema in his left lower leg.  The lymphedema area looks a little bit red but just above this in the hamstring thigh area that looks to be some warmth and cellulitis as well.  He has bodyaches all over.  Could be viral process.  Could be UTI pneumonia.  Is not having any abdominal pain.  Ostomy is in place with some loose stool in it.  Overall sepsis workup initiated IV fluids broad-spectrum IV antibiotics blood cultures lactic acid obtained.  Will admit once lab work results.  Per my review interpretation of labs lactic acid is 2 white count is 16.  Creatinine at baseline at 2.13.  Lab work otherwise unremarkable.  No significant anemia.  Chest x-ray no evidence of pneumonia.  I do think that source of infection likely from cellulitis in the left lower leg.  That was believed to be possibly the source of strep bacteremia recently.  The limb does look  well-perfused otherwise.  Patient was again agitated a little bit aggressive with staff had to give some Versed  to help keep him calm.  He was answering questions appropriately had very low suspicion for meningitis.  Overall we will admit to medicine for further sepsis care.  Hemodynamically stable throughout my care.  This chart was dictated using voice recognition software.  Despite best efforts to proofread,  errors can occur which can change the documentation meaning.      Final diagnoses:  Sepsis, due to unspecified organism, unspecified whether acute organ dysfunction present [facility])  Cellulitis, unspecified cellulitis site    ED Discharge Orders     None          Ruthe Cornet, DO 02/21/24 1033

## 2024-02-21 NOTE — Consult Note (Addendum)
 WOC Nurse ostomy consult note Stoma type/location: End ileostomy RUQ Stomal assessment/size: 38 mm, red and viable, 3 cm above the skin level, os in center, hernia peri-ostomy. Peristomal assessment: intact, peri-ostomy hernia, granuloma 3 to 6 o'clock. Treatment options for stomal/peristomal skin: Ring [id number]. Output bag was full, the pt took off the bag and stools were over his body and bed. Brown soft stools. Ostomy pouching: 1pc. #[id number], ring [id number].  Education provided: Old ostomy, pt dependent in care. Bed nurse instructions: - Cleanse the skin with saline and soft wash cloth or gauze, pat dry the peri-ostomy skin. - Cut the barrier as the ostomy size and shape. Addendum: the ostomy has a granuloma on 3 to 6 o'clock position, slight lift up to apply the wafer. - Around the cut on the barrier, applied the ring, stretching and modeling until match. - Take the plastic protection off, apply to his skin, take the drape protection off, adapting in his abdomen. - Close the lock in roll system. - Empty when is 1/3 full or 1/2 full. - Change the pouch 2 times per week or if is leaking.  Enrolled patient in [employer] DC program: Yes previous.   WOC Nurse Consult Note: Reason for Consult: MASD intertriginous and MASD between gluteal fold (peri-anus). Wound type: MASD, not a PI. Pressure Injury POA: NA Measurement: perineum dermatitis, peri-anus 2 wounds bilateral. Wound bed: 100% fibrinous yellow. Drainage (amount, consistency, odor) Minimum amount, no odor, serous. Periwound: Intact. Dressing procedure/placement/frequency: Cleanse with warm water , pat dry. Apply Desitin to the groin once a day or PRN. Peri-anal wounds: Cleanse with warm water , pat dry. Apply Medihoney daily to the wound bed, cover with foam dressing, change every 3 days or PRN.  WOC team will not plan to follow further. Please reconsult if further assistance is needed. Thank-you,  Lela Holm RN,  CNS, [employer], MSN.  (Phone [phone])

## 2024-02-21 NOTE — Progress Notes (Signed)
 Pharmacy Antibiotic Note  Gregory [name] is a 63 y.o. male admitted on 02/21/2024 with wound infection and sepsis.  Pharmacy has been consulted for Vancomycin  and Cefepime  dosing.  Plan: Vancomycin  2000 mg IV once followed by 1750 mg IV every 48 hours.  Goal trough 10-15 mcg/mL. Cefepime  2 g Q 12H  Weight: 111.1 kg (245 lb)  Temp (24hrs), Avg:101.8 F (38.8 C), Min:98.9 F (37.2 C), Max:104.6 F (40.3 C)  Recent Labs  Lab 02/21/24 0934 02/21/24 0941 02/21/24 1140  WBC 16.6*  --   --   CREATININE 2.13*  --   --   LATICACIDVEN  --  2.0* 2.0*    Estimated Creatinine Clearance: 40.3 mL/min (A) (by C-G formula based on SCr of 2.13 mg/dL (H)).    Allergies  Allergen Reactions   Latex Other (See Comments)    Allergic, per MAR    Antimicrobials this admission: Vancomycin  9/5 >>  Cefepime  9/5 >>   Dose adjustments this admission:   Microbiology results: 9/5 BCx: pending   Thank you for allowing pharmacy to be a part of this patient's care.   Larraine Brazier, PharmD Clinical Pharmacist 02/21/2024  3:09 PM **Pharmacist phone directory can now be found on [email or web address] ([email or web address]).  Listed under [facility].

## 2024-02-21 NOTE — ED Triage Notes (Incomplete)
 Pt to ED via EMS from SNF with c/o AMS. Pt baseline A&Ox4 now A&Ox2. Pt refused all meds at facility this morning. Pt reportedly calling out to staff to take off his right leg. Pt has AKA right leg.

## 2024-02-21 NOTE — ED Notes (Signed)
 Ostomy bag ordered for the patient.

## 2024-02-21 NOTE — Sepsis Progress Note (Signed)
 Code Sepsis protocol being monitored by eLink.

## 2024-02-21 NOTE — H&P (Addendum)
 History and Physical    Patient: Gregory [name] FMW:[id number] DOB: 1961/05/13 DOA: 02/21/2024 DOS: the patient was seen and examined on 02/21/2024 PCP: [name], Provider, MD  Patient coming from: SNF  Chief Complaint:  Chief Complaint  Patient presents with   Altered Mental Status   HPI: Gregory [name] is a 63 y.o. male with medical history significant of hypertension, hyperlipidemia, type 2 diabetes on insulin , cervical spinal stenosis, high output ileostomy, COPD, right AKA, chronic pain syndrome on multiple narcotics, stroke, and recent admission in 12/2023 for GBS bacteremia and encephalopathy p/w confusion/encephalopathy and found to have septic shock 2/2 LLE cellulitis.    Pt unable to provide medical history 2/2 sedative administered in ED. Family and healthcare workers at [facility] unavailable. From what I can gather per Epic review, pt presented w/ body aches all over, fever and confusion; in fact, he ripped off his ostomy bad in the ED.  In the ED, pt hypertensive, tachycardic, and tachypneic w/o hypoxia. Febrile to 104. Labs notable for Cr 2.13, WBC 16.6, and lactic acid 2. EDP started IV abx and requested medicine admission for sepsis.   Review of Systems: As mentioned in the history of present illness. All other systems reviewed and are negative. 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    Chronic pain syndrome    COPD (chronic obstructive pulmonary disease) (HCC)    DDD (degenerative disc  disease), lumbar    Depression    major depressive disorder    Diabetes mellitus without complication (HCC)    type 2    Diabetic ulcer of toe of left foot (HCC) 10/02/2016   Difficult intubation    Difficulty in walking, not elsewhere classified    Epidural abscess 10/02/2016   Foot drop, bilateral    Foot drop, right 12/27/2015   GERD (gastroesophageal reflux disease)    Herpesviral vesicular dermatitis    History of COVID-19 03/15/2022   Hyperlipidemia    Hyperlipidemia    Hypertension    Insomnia    Malignant melanoma of other parts of face (HCC)    Melanoma (HCC)    MRSA bacteremia    Muscle weakness (generalized)    Nasal congestion    Necrosis of toe (HCC) 07/08/2018   Neuromuscular dysfunction of bladder    Osteomyelitis (HCC)    Osteomyelitis (HCC)    Osteomyelitis (HCC)    right BKA   Other idiopathic peripheral autonomic neuropathy    Retention of urine, unspecified    Squamous cell carcinoma of skin    Stroke (HCC)    Unsteadiness on feet    Urinary retention    Urinary retention    Urinary tract infection    Wears dentures    Wears glasses    Past Surgical History:  Procedure Laterality Date   AMPUTATION Left 03/29/2017   Procedure: LEFT GREAT TOE AMPUTATION AT METATARSOPHALANGEAL JOINT;  Surgeon: Harden Jerona GAILS, MD;  Location: [facility] OR;  Service: Orthopedics;  Laterality: Left;   AMPUTATION Right 03/29/2017   Procedure: RIGHT BELOW KNEE AMPUTATION;  Surgeon: Harden Jerona GAILS, MD;  Location: [facility] OR;  Service: Orthopedics;  Laterality: Right;   AMPUTATION Left 06/06/2018   Procedure: LEFT 2ND TOE AMPUTATION;  Surgeon: Harden Jerona GAILS, MD;  Location: [facility] OR;  Service: Orthopedics;  Laterality: Left;   AMPUTATION Right 11/19/2018   Procedure: AMPUTATION ABOVE KNEE;  Surgeon: Harden Jerona GAILS, MD;  Location: [facility] OR;  Service: Orthopedics;  Laterality: Right;   ANTERIOR CERVICAL CORPECTOMY N/A 11/25/2015   Procedure: ANTERIOR CERVICAL FIVE CORPECTOMY Cervical four - six  fusion;  Surgeon: Gerldine Maizes, MD;  Location: [facility];  Service: Neurosurgery;  Laterality: N/A;  ANTERIOR CERVICAL FIVE CORPECTOMY Cervical four - six fusion   APPLICATION OF WOUND VAC Right 10/22/2018   Procedure: Application Of Wound Vac;  Surgeon: Harden Jerona GAILS, MD;  Location: [facility] OR;  Service: Orthopedics;  Laterality: Right;   CYSTOSCOPY WITH BIOPSY N/A 05/01/2022   Procedure: CYSTOSCOPY WITH URETHRAL BIOPSY;  Surgeon: Elisabeth Valli BIRCH, MD;  Location: [facility] ORS;  Service: Urology;  Laterality: N/A;  1 HR   CYSTOSCOPY WITH RETROGRADE URETHROGRAM N/A 04/25/2018   Procedure: CYSTOSCOPY WITH RETROGRADE URETHROGRAM/ BALLOON DILATION;  Surgeon: Ottelin, Mark, MD;  Location: [facility] ORS;  Service: Urology;  Laterality: N/A;   CYSTOSCOPY WITH URETHRAL DILATATION N/A 05/01/2022   Procedure: BALLOON DILATION WITH  OPTILUME;  Surgeon: Elisabeth Valli BIRCH, MD;  Location: [facility] ORS;  Service: Urology;  Laterality: N/A;   LAPAROTOMY N/A 12/03/2022   Procedure: EXPLORATORY LAPAROTOMY  total abdominal colectomy with end ileostomy;  Surgeon: Kinsinger, Herlene Righter, MD;  Location: [facility] OR;  Service: General;  Laterality: N/A;   MULTIPLE TOOTH EXTRACTIONS     POSTERIOR CERVICAL FUSION/FORAMINOTOMY N/A 11/29/2015   Procedure: Cervical Three-Cervical Seven Posterior Cervical Laminectomy with Fusion;  Surgeon: Gerldine Maizes, MD;  Location: [facility];  Service: Neurosurgery;  Laterality: N/A;  Cervical Three-Cervical Seven Posterior Cervical Laminectomy with Fusion   STUMP REVISION Right 10/22/2018   Procedure: REVISION RIGHT BELOW KNEE AMPUTATION;  Surgeon: Harden Jerona GAILS, MD;  Location: [facility] OR;  Service: Orthopedics;  Laterality: Right;   STUMP REVISION Right 12/05/2018   Procedure: Revision Right Above Knee Amputation;  Surgeon: Harden Jerona GAILS, MD;  Location: [facility] OR;  Service: Orthopedics;  Laterality: Right;   STUMP REVISION Right 05/29/2019   Procedure: REVISION RIGHT ABOVE KNEE AMPUTATION;  Surgeon: Harden Jerona GAILS,  MD;  Location: [facility] OR;  Service: Orthopedics;  Laterality: Right;   STUMP REVISION Right 06/24/2019   Procedure: STUMP REVISION;  Surgeon: Harden Jerona GAILS, MD;  Location: [facility] OR;  Service: Orthopedics;  Laterality: Right;   Social History:  reports that he has been smoking cigarettes. He has never used smokeless tobacco. He reports that he does not drink alcohol  and does not use drugs.  Allergies  Allergen Reactions   Latex Other (See Comments)    Allergic, per [facility]    Family History  Problem Relation Age of Onset   Diabetes Mother    Heart disease Mother    Cancer Mother    Cancer Father    Bone cancer Father    Prostate cancer Father    Cancer Paternal Grandfather    Cancer Other    Diabetes Other     Prior to Admission medications   Medication Sig Start Date End Date Taking? Authorizing Provider  acetaminophen  (TYLENOL ) 325 MG tablet Take 2 tablets (650 mg total) by mouth every 6 (six) hours as needed for mild pain (pain score 1-3) (or Fever >/= 101). 09/08/23   Elgergawy, Brayton RAMAN, MD  aspirin  EC 81 MG tablet Take 81 mg by mouth daily.    [provider]  atorvastatin  (LIPITOR ) 80 MG tablet Take 1 tablet (80 mg total) by mouth daily. 09/22/22 01/27/24  Cindy Garnette POUR, MD  busPIRone  (BUSPAR ) 5 MG tablet Take 5 mg by mouth in the morning and at bedtime.    [provider]  Cholecalciferol  25 MCG (1000 UT) capsule Take 2,000 Units by mouth daily.    [provider]  docusate sodium  (COLACE) 100 MG capsule Take 100 mg by mouth every 12 (twelve) hours.    [provider]  Dulaglutide  0.75 MG/0.5ML SOAJ Inject 0.75 mg into the skin every Thursday.    [provider]  escitalopram  (LEXAPRO ) 5 MG tablet Take 5 mg by mouth See admin instructions. Take 5 mg by mouth at 7 AM for 14 days, then stop 12/20/23 01/03/24  [provider]  ferrous sulfate  325 (65 FE) MG tablet Take 1 tablet (325 mg total) by mouth 3 (three) times daily with  meals. Patient taking differently: Take 325 mg by mouth daily with breakfast. 01/23/23   Bernard Drivers, MD  insulin  lispro (HUMALOG ) 100 UNIT/ML KwikPen Inject 0-10 Units into the skin See admin instructions. Inject 0-10 units into the skin three times a day and at bedtime, PER SLIDING SCALE:  BGL 151-200 = 2 units 201-250 = 4 units 251-300 = 6 units 301-350 =  8 units 351-400 = 10 units Patient taking differently: Inject 0-10 Units into the skin See admin instructions. Inject 0-10 units into the skin before meals and at bedtime, PER SLIDING SCALE: BGL 0-150 = give nothing; 151-200 = 2 units; 201-250 = 4 units; 251-300 = 6 units; 301-350 =  8 units; 351-400 = 10 units 01/31/22   Dahal, Chapman, MD  ipratropium-albuterol  (DUONEB) 0.5-2.5 (3) MG/3ML SOLN Take 3 mLs by nebulization every 6 (six) hours as needed (shortness of breath, wheezing).    [provider]  JOURNAVX 50 MG TABS Take 1 tablet by mouth 2 (two) times daily. 01/22/24   [provider]  lactulose  (CHRONULAC ) 10 GM/15ML solution Take 30 mLs (20 g total) by mouth 2 (two) times daily. 08/19/23   Samtani, Jai-Gurmukh, MD  levETIRAcetam  (KEPPRA ) 500 MG tablet Take 1 tablet (500 mg total) by mouth 2 (two) times daily. 12/03/23 03/02/24  Jerrol Agent, MD  lidocaine  4 % Place 1 patch onto the skin See admin instructions. Apply 1 new patch to the affected area and remove 12 hours later    [provider]  magnesium  oxide (MAG-OX) 400 (240 Mg) MG tablet Take 400 mg by mouth 3 (three) times daily.    [provider]  Melatonin 10 MG TABS Take 10 mg by mouth at bedtime.    [provider]  Meth-Hyo-M Bl-Na Phos-Ph Sal (URO-MP) 118 MG CAPS Take 118 mg by mouth 2 (two) times daily.    [provider]  methocarbamol  (ROBAXIN ) 500 MG tablet Take 500 mg by mouth 3 (three) times daily.    [provider]  Multiple Vitamins-Minerals (MULTIVITAMIN WITH MINERALS) tablet Take 1 tablet by mouth daily.     [provider]  naloxone  (NARCAN ) 0.4 MG/ML injection Inject 0.4 mg into the muscle as needed (for oversedation).    [provider]  naloxone  (NARCAN ) nasal spray 4 mg/0.1 mL Place 1 spray into the nose See admin instructions. Instill 1 spray into alternating nostrils every 5 minutes as needed for an opiate overdose    [provider]  omeprazole (PRILOSEC) 20 MG capsule Take 20 mg by mouth at bedtime.    [provider]  ondansetron  (ZOFRAN ) 4 MG tablet Take 4 mg by mouth every 6 (six) hours as needed for nausea or vomiting.    [provider]  OXcarbazepine  (TRILEPTAL ) 150 MG tablet Take 1 tablet (150 mg total) by mouth 2 (two) times daily. 08/19/23   Samtani, Jai-Gurmukh, MD  oxyCODONE  (ROXICODONE  INTENSOL) 20 MG/ML concentrated solution Take 10-15 mg by mouth See admin instructions. Give 15 mg by mouth at 9 AM, 1 PM, 5 PM, and 9 PM and an additional 10 mg every six hours as needed for pain    [provider]  pregabalin  (LYRICA ) 200 MG capsule Take 200 mg by mouth 2 (two) times daily.    [provider]  prochlorperazine  (COMPAZINE ) 5 MG tablet Take 5 mg by mouth every 6 (six) hours as needed for nausea or vomiting.    [provider]  PROTEIN PO Take 30 mLs by mouth 2 (two) times daily.    [provider]  senna (SENOKOT) 8.6 MG TABS tablet Take 2 tablets by mouth at bedtime.    [provider]  sodium bicarbonate  650 MG tablet Take 650 mg by mouth 2 (two) times daily.    [provider]  tamsulosin  (FLOMAX ) 0.4 MG CAPS capsule Take 1 capsule (0.4 mg total) by mouth daily. 08/19/23   Royal Sill, MD    Physical Exam: Vitals:   02/21/24 0901 02/21/24 0905 02/21/24 1015 02/21/24 1100  BP: (!) 149/94  (!) 139/94 (!) 117/97  Pulse: (!) 127 (!) 124 (!) 127 (!) 131  Resp: (!) 29 (!) 22 (!) 27 (!) 23  Temp: (!) 100.5 F (38.1 C) (!) 104.6 F (40.3 C)  (!) 103.1 F (39.5 C)  TempSrc: Oral  Rectal  Oral  SpO2: 98% 96% 95% 96%  Weight:  111.1 kg     General: Awakens to voice, but speaking in slurred speech, oriented x0; sedated  Respiratory: Lungs clear to auscultation bilaterally with normal respiratory effort; no w/r/r Cardiovascular: Regular rate and rhythm w/o m/r/g MSK: RLE w/ diffuse swelling, erythema, and mottling   Data Reviewed:  Lab Results  Component Value Date   WBC 16.6 (H) 02/21/2024   HGB 12.3 (L) 02/21/2024   HCT 39.8 02/21/2024   MCV 92.8 02/21/2024   PLT 241 02/21/2024   Lab Results  Component Value Date   GLUCOSE 131 (H) 02/21/2024   CALCIUM  8.8 (L) 02/21/2024   NA 139 02/21/2024   K 5.5 (H) 02/21/2024   CO2 23 02/21/2024   CL 105 02/21/2024   BUN 26 (H) 02/21/2024   CREATININE 2.13 (H) 02/21/2024   Lab Results  Component Value Date   ALT 16 02/21/2024   AST 19 02/21/2024   ALKPHOS 84 02/21/2024   BILITOT 0.8 02/21/2024   Lab Results  Component Value Date   INR 1.1 12/24/2023   INR >10.0 (HH) 12/24/2023   INR 1.0 09/25/2023   Radiology: [facility] Chest Port 1 View Result Date: 02/21/2024 CLINICAL DATA:  Altered mental status. EXAM: PORTABLE CHEST 1 VIEW COMPARISON:  December 24, 2023. FINDINGS: Stable cardiomediastinal silhouette. Mild central pulmonary vascular congestion is noted. No consolidative process is noted. Bony  thorax is unremarkable. IMPRESSION: Mild central pulmonary vascular congestion. Electronically Signed   By: Lynwood Landy Raddle M.D.   On: 02/21/2024 10:17    Assessment and Plan: 17M long-term nursing home resident w/ h/o hypertension, hyperlipidemia, type 2 diabetes on insulin , cervical spinal stenosis, high output ileostomy, COPD, right AKA, chronic pain syndrome on multiple narcotics, stroke, and recent admission in 12/2023 for GBS bacteremia and encephalopathy p/w confusion/encephalopathy and found to have septic shock 2/2 LLE cellulitis.    Septic shock 2/2 LLE cellulitis H/o GBS bacteremia in 12/2023 Lactate 2 -IV  vancomycin  and IV cefepime  per pharmacy -MIVF: LR at 150cc/h for now -MRI L leg to eval for OM -F/u ESR/CRP -F/u blood cultures; may need TTE if positive (NOTE: TTE previously neg for vegetations in 12/2023) -NPO for now  Acute encephalopathy Related to sepsis above; of note, high suspicion for polypharmacy as contributing factor during last visit; pta on BuSpar , Trileptal , Lexapro , Keppra , lyrica , robaxin  and oxycodone  15 mg up to 4 times a day  -HOLD pta Buspar , Lyrica , robaxin  and oxycodone  -Continue pta keppra , trileptal , and Lexapro   Chronic pain -PTA oxycodone  10mg  QID prn   Advance Care Planning:   Code Status: Prior   Consults: N/A  Family Communication: N/A  Severity of Illness: The appropriate patient status for this patient is INPATIENT. Inpatient status is judged to be reasonable and necessary in order to provide the required intensity of service to ensure the patient's safety. The patient's presenting symptoms, physical exam findings, and initial radiographic and laboratory data in the context of their chronic comorbidities is felt to place them at high risk for further clinical deterioration. Furthermore, it is not anticipated that the patient will be medically stable for discharge from the hospital within 2 midnights of admission.   * I certify that at the point of admission it is my clinical judgment that the patient will require inpatient hospital care spanning beyond 2 midnights from the point of admission due to high intensity of service, high risk for further deterioration and high frequency of surveillance required.*   ------- I spent 55 minutes reviewing previous notes, at the bedside counseling/discussing the treatment plan, and performing clinical documentation.  Author: Marsha Ada, MD 02/21/2024 12:47 PM  For on call review [email or web address].

## 2024-02-21 NOTE — ED Notes (Signed)
 Pt ripped colostomy bag off. Order placed for new supplies.

## 2024-02-21 NOTE — ED Notes (Addendum)
 Wound team at bedside. New ostomy bag placed. Mepilex placed to pt's coccyx due to multiple wounds noted. Pt cleaned, new gown and sheets placed.

## 2024-02-22 DIAGNOSIS — I10 Essential (primary) hypertension: Secondary | ICD-10-CM | POA: Diagnosis not present

## 2024-02-22 DIAGNOSIS — G894 Chronic pain syndrome: Secondary | ICD-10-CM | POA: Diagnosis not present

## 2024-02-22 DIAGNOSIS — E785 Hyperlipidemia, unspecified: Secondary | ICD-10-CM

## 2024-02-22 DIAGNOSIS — A419 Sepsis, unspecified organism: Secondary | ICD-10-CM | POA: Diagnosis not present

## 2024-02-22 LAB — CBC
HCT: 30.8 % — ABNORMAL LOW (ref 39.0–52.0)
Hemoglobin: 9.6 g/dL — ABNORMAL LOW (ref 13.0–17.0)
MCH: 28.7 pg (ref 26.0–34.0)
MCHC: 31.2 g/dL (ref 30.0–36.0)
MCV: 91.9 fL (ref 80.0–100.0)
Platelets: 162 K/uL (ref 150–400)
RBC: 3.35 MIL/uL — ABNORMAL LOW (ref 4.22–5.81)
RDW: 15.9 % — ABNORMAL HIGH (ref 11.5–15.5)
WBC: 11.6 K/uL — ABNORMAL HIGH (ref 4.0–10.5)
nRBC: 0 % (ref 0.0–0.2)

## 2024-02-22 LAB — BASIC METABOLIC PANEL WITH GFR
Anion gap: 12 (ref 5–15)
BUN: 25 mg/dL — ABNORMAL HIGH (ref 8–23)
CO2: 21 mmol/L — ABNORMAL LOW (ref 22–32)
Calcium: 8.7 mg/dL — ABNORMAL LOW (ref 8.9–10.3)
Chloride: 108 mmol/L (ref 98–111)
Creatinine, Ser: 1.99 mg/dL — ABNORMAL HIGH (ref 0.61–1.24)
GFR, Estimated: 37 mL/min — ABNORMAL LOW (ref >60)
Glucose, Bld: 99 mg/dL (ref 70–99)
Potassium: 4.4 mmol/L (ref 3.5–5.1)
Sodium: 141 mmol/L (ref 135–145)

## 2024-02-22 LAB — GLUCOSE, CAPILLARY: Glucose-Capillary: 140 mg/dL — ABNORMAL HIGH (ref 70–99)

## 2024-02-22 MED ORDER — LINEZOLID 600 MG/300ML IV SOLN
600.0000 mg | Freq: Two times a day (BID) | INTRAVENOUS | Status: DC
  Administered 2024-02-23: 600 mg via INTRAVENOUS
  Filled 2024-02-22: qty 300

## 2024-02-22 MED ORDER — HYDROMORPHONE HCL 1 MG/ML IJ SOLN
0.5000 mg | Freq: Four times a day (QID) | INTRAMUSCULAR | Status: DC | PRN
  Administered 2024-02-22 – 2024-02-23 (×4): 0.5 mg via INTRAVENOUS
  Filled 2024-02-22 (×4): qty 1

## 2024-02-22 MED ORDER — INSULIN ASPART 100 UNIT/ML IJ SOLN
0.0000 [IU] | Freq: Three times a day (TID) | INTRAMUSCULAR | Status: DC
  Administered 2024-02-23 – 2024-02-25 (×3): 1 [IU] via SUBCUTANEOUS
  Administered 2024-02-25: 2 [IU] via SUBCUTANEOUS
  Administered 2024-02-26 – 2024-02-27 (×4): 1 [IU] via SUBCUTANEOUS
  Administered 2024-02-28: 2 [IU] via SUBCUTANEOUS

## 2024-02-22 MED ORDER — FLUTICASONE PROPIONATE 50 MCG/ACT NA SUSP
1.0000 | Freq: Every day | NASAL | Status: DC
  Administered 2024-02-22 – 2024-02-25 (×3): 1 via NASAL
  Filled 2024-02-22: qty 16

## 2024-02-22 MED ORDER — CALCIUM CARBONATE ANTACID 500 MG PO CHEW
1.0000 | CHEWABLE_TABLET | Freq: Three times a day (TID) | ORAL | Status: DC
  Administered 2024-02-22 – 2024-03-01 (×17): 200 mg via ORAL
  Filled 2024-02-22 (×21): qty 1

## 2024-02-22 MED ORDER — ENOXAPARIN SODIUM 60 MG/0.6ML IJ SOSY
50.0000 mg | PREFILLED_SYRINGE | INTRAMUSCULAR | Status: DC
  Administered 2024-02-22 – 2024-02-28 (×6): 50 mg via SUBCUTANEOUS
  Filled 2024-02-22 (×7): qty 0.6

## 2024-02-22 MED ORDER — OXYCODONE HCL ER 15 MG PO T12A
15.0000 mg | EXTENDED_RELEASE_TABLET | Freq: Two times a day (BID) | ORAL | Status: DC
  Administered 2024-02-22 – 2024-02-23 (×3): 15 mg via ORAL
  Filled 2024-02-22 (×3): qty 1

## 2024-02-22 MED ORDER — ONDANSETRON HCL 4 MG/2ML IJ SOLN
4.0000 mg | Freq: Four times a day (QID) | INTRAMUSCULAR | Status: DC | PRN
  Administered 2024-02-22 – 2024-03-01 (×9): 4 mg via INTRAVENOUS
  Filled 2024-02-22 (×10): qty 2

## 2024-02-22 NOTE — Progress Notes (Signed)
 [name]                                                                               Kemper Heupel, is a 63 y.o. male, DOB - 1960/06/22, FMW:[id number] Admit date - 02/21/2024    Outpatient Primary MD for the patient is [name], Provider, MD  LOS - 1  days    Brief summary   Gregory [name] is a 63 y.o. male with medical history significant of hypertension, hyperlipidemia, type 2 diabetes on insulin , cervical spinal stenosis, high output ileostomy, COPD, right AKA, chronic pain syndrome on multiple narcotics, stroke, and recent admission in 12/2023 for GBS bacteremia and encephalopathy p/w confusion/encephalopathy and found to have sepsis 2/2 LLE cellulitis.   In the ED, pt hypertensive, tachycardic, and tachypneic w/o hypoxia. Febrile to 104. Labs notable for Cr 2.13, WBC 16.6, and lactic acid 2. EDP started IV abx and requested medicine admission for sepsis.     Assessment & Plan    Assessment and Plan:  Sepsis secondary to possibly left lower extremity cellulitis Pt was febrile, tachycardic, tachypneic, with leukocytosis and elevated lactic acid.  He was started on broad spectrum IV antibiotics.  Sepsis physiology improving.  CRP is 11.8, sed rate around 69. Resp panel is negative. UA IS NEGATIVE.  Blood cultures are pending.  CXR  Trend lactate.  Leukocytosis improving.    Chronic pain syndrome.  Restarted him on oxycontin  and prn oxycodone .    Hypertension Slightly elevated suspect from pain.  Monitor.    Type 2 DM CBG (last 3)  No results for input(s): GLUCAP in the last 72 hours. Get A1c , start SSI.    Constipation Resume lactulose .    S/p ileostomy Monitor electrolytes.    Hyperlipidemia Resume statin.   Hyperkalemia Resolved.    Anemia of chronic disease Hemoglobin around 9. Monitor.   Estimated body mass index is 40.77 kg/m as calculated from the following:   Height as of 12/24/23: 5' 5 (1.651 m).   Weight as of this  encounter: 111.1 kg.  Code Status: full code.  DVT Prophylaxis:     Level of Care: Level of care: Progressive Family Communication: none at bedside.   Disposition Plan:     Remains inpatient appropriate:    Procedures:  None.   Consultants:   None.   Antimicrobials:   Anti-infectives (From admission, onward)    Start     Dose/Rate Route Frequency Ordered Stop   02/23/24 1200  vancomycin  (VANCOREADY) IVPB 1750 mg/350 mL        1,750 mg 175 mL/hr over 120 Minutes Intravenous Every 48 hours 02/21/24 1506     02/21/24 2200  ceFEPIme  (MAXIPIME ) 2 g in sodium chloride  0.9 % 100 mL IVPB        2 g 200 mL/hr over 30 Minutes Intravenous Every 12 hours 02/21/24 1506     02/21/24 0915  vancomycin  (VANCOCIN ) IVPB 1000 mg/200 mL premix  Status:  Discontinued        1,000 mg 200 mL/hr over 60 Minutes Intravenous  Once 02/21/24 0904 02/21/24 0905   02/21/24 0915  ceFEPIme  (MAXIPIME ) 2 g in sodium chloride  0.9 %  100 mL IVPB        2 g 200 mL/hr over 30 Minutes Intravenous  Once 02/21/24 0904 02/21/24 1019   02/21/24 0915  vancomycin  (VANCOREADY) IVPB 2000 mg/400 mL        2,000 mg 200 mL/hr over 120 Minutes Intravenous  Once 02/21/24 0905 02/21/24 1352        Medications  Scheduled Meds:  aspirin  EC  81 mg Oral Daily   atorvastatin   80 mg Oral Daily   [START ON 02/23/2024] calcium  carbonate  1 tablet Oral TID WC   enoxaparin  (LOVENOX ) injection  50 mg Subcutaneous Q24H   escitalopram   5 mg Oral Daily   fluticasone   1 spray Each Nare Daily   lactulose   20 g Oral BID   levETIRAcetam   500 mg Oral BID   OXcarbazepine   150 mg Oral BID   oxyCODONE   15 mg Oral Q12H   tamsulosin   0.4 mg Oral Daily   Continuous Infusions:  ceFEPime  (MAXIPIME ) IV 2 g (02/22/24 0952)   [START ON 02/23/2024] vancomycin      PRN Meds:.HYDROmorphone  (DILAUDID ) injection, ondansetron  (ZOFRAN ) IV, oxyCODONE     Subjective:   Gregory [name] was seen and examined today.  Wanted to go back to [facility] [facility].   Objective:   Vitals:   02/22/24 0857 02/22/24 1247 02/22/24 1318 02/22/24 1846  BP: 127/63  (!) 171/88 (!) 166/76  Pulse: 64  98 98  Resp: 20  19 19   Temp: 98.8 F (37.1 C) 98.1 F (36.7 C)  97.9 F (36.6 C)  TempSrc: Oral Oral  Oral  SpO2: 94%  96% 96%  Weight:        Intake/Output Summary (Last 24 hours) at 02/22/2024 2000 Last data filed at 02/22/2024 0939 Gross per 24 hour  Intake 2075.64 ml  Output 2800 ml  Net -724.36 ml   Filed Weights   02/21/24 0905  Weight: 111.1 kg     Exam General exam: Appears calm and comfortable  Respiratory system: Clear to auscultation.  Cardiovascular system: S1 & S2 heard, RRR.  Gastrointestinal system: Abdomen is nondistended, soft and nontender.  Central nervous system: Alert and oriented to person and place.  Extremities: Right BKA and left lower extremity swelling present.  Skin: No rashes Psychiatry: restless ,    Data Reviewed:  I have personally reviewed following labs and imaging studies   CBC Lab Results  Component Value Date   WBC 11.6 (H) 02/22/2024   RBC 3.35 (L) 02/22/2024   HGB 9.6 (L) 02/22/2024   HCT 30.8 (L) 02/22/2024   MCV 91.9 02/22/2024   MCH 28.7 02/22/2024   PLT 162 02/22/2024   MCHC 31.2 02/22/2024   RDW 15.9 (H) 02/22/2024   LYMPHSABS 0.7 02/21/2024   MONOABS 0.6 02/21/2024   EOSABS 0.3 02/21/2024   BASOSABS 0.1 02/21/2024     Last metabolic panel Lab Results  Component Value Date   NA 141 02/22/2024   K 4.4 02/22/2024   CL 108 02/22/2024   CO2 21 (L) 02/22/2024   BUN 25 (H) 02/22/2024   CREATININE 1.99 (H) 02/22/2024   GLUCOSE 99 02/22/2024   GFRNONAA 37 (L) 02/22/2024   GFRAA 42 (L) 02/24/2020   CALCIUM  8.7 (L) 02/22/2024   PHOS 4.6 08/15/2023   PROT 6.8 02/21/2024   ALBUMIN  2.9 (L) 02/21/2024   BILITOT 0.8 02/21/2024   ALKPHOS 84 02/21/2024   AST 19 02/21/2024   ALT 16 02/21/2024   ANIONGAP 12 02/22/2024    CBG (last 3)  No results for input(s): GLUCAP in the last 72  hours.    Coagulation Profile: Recent Labs  Lab 02/21/24 1740  INR 1.1     Radiology Studies: [facility] Chest Port 1 View Result Date: 02/21/2024 CLINICAL DATA:  Altered mental status. EXAM: PORTABLE CHEST 1 VIEW COMPARISON:  December 24, 2023. FINDINGS: Stable cardiomediastinal silhouette. Mild central pulmonary vascular congestion is noted. No consolidative process is noted. Bony thorax is unremarkable. IMPRESSION: Mild central pulmonary vascular congestion. Electronically Signed   By: Lynwood Landy Raddle M.D.   On: 02/21/2024 10:17       Elgie Butter M.D. [name] 02/22/2024, 8:00 PM  Available via Epic secure chat 7am-7pm After 7 pm, please refer to night coverage provider listed on amion.

## 2024-02-23 ENCOUNTER — Inpatient Hospital Stay (HOSPITAL_COMMUNITY)

## 2024-02-23 DIAGNOSIS — G894 Chronic pain syndrome: Secondary | ICD-10-CM | POA: Diagnosis not present

## 2024-02-23 DIAGNOSIS — A419 Sepsis, unspecified organism: Secondary | ICD-10-CM | POA: Diagnosis not present

## 2024-02-23 DIAGNOSIS — E785 Hyperlipidemia, unspecified: Secondary | ICD-10-CM | POA: Diagnosis not present

## 2024-02-23 DIAGNOSIS — I10 Essential (primary) hypertension: Secondary | ICD-10-CM | POA: Diagnosis not present

## 2024-02-23 LAB — GLUCOSE, CAPILLARY
Glucose-Capillary: 115 mg/dL — ABNORMAL HIGH (ref 70–99)
Glucose-Capillary: 124 mg/dL — ABNORMAL HIGH (ref 70–99)
Glucose-Capillary: 143 mg/dL — ABNORMAL HIGH (ref 70–99)
Glucose-Capillary: 199 mg/dL — ABNORMAL HIGH (ref 70–99)

## 2024-02-23 MED ORDER — OXYMETAZOLINE HCL 0.05 % NA SOLN
1.0000 | Freq: Two times a day (BID) | NASAL | Status: AC
  Administered 2024-02-23 – 2024-02-25 (×4): 1 via NASAL
  Filled 2024-02-23 (×2): qty 30

## 2024-02-23 MED ORDER — METHOCARBAMOL 1000 MG/10ML IJ SOLN
500.0000 mg | Freq: Four times a day (QID) | INTRAMUSCULAR | Status: DC | PRN
  Administered 2024-02-24 – 2024-02-27 (×5): 500 mg via INTRAVENOUS
  Filled 2024-02-23 (×5): qty 10

## 2024-02-23 MED ORDER — ADULT MULTIVITAMIN W/MINERALS CH
1.0000 | ORAL_TABLET | Freq: Every day | ORAL | Status: DC
  Administered 2024-02-23 – 2024-03-01 (×6): 1 via ORAL
  Filled 2024-02-23 (×8): qty 1

## 2024-02-23 MED ORDER — DIAZEPAM 5 MG/ML IJ SOLN
5.0000 mg | Freq: Three times a day (TID) | INTRAMUSCULAR | Status: DC | PRN
  Administered 2024-02-23 – 2024-02-24 (×2): 5 mg via INTRAVENOUS
  Filled 2024-02-23 (×2): qty 2

## 2024-02-23 MED ORDER — ALUM & MAG HYDROXIDE-SIMETH 200-200-20 MG/5ML PO SUSP
30.0000 mL | ORAL | Status: DC | PRN
  Administered 2024-02-23: 30 mL via ORAL
  Filled 2024-02-23 (×2): qty 30

## 2024-02-23 MED ORDER — LIDOCAINE 5 % EX PTCH
1.0000 | MEDICATED_PATCH | CUTANEOUS | Status: DC
  Administered 2024-02-23 – 2024-02-24 (×2): 1 via TRANSDERMAL
  Filled 2024-02-23 (×4): qty 1

## 2024-02-23 MED ORDER — MELATONIN 5 MG PO TABS
10.0000 mg | ORAL_TABLET | Freq: Every day | ORAL | Status: DC
  Administered 2024-02-23 – 2024-02-29 (×6): 10 mg via ORAL
  Filled 2024-02-23 (×7): qty 2

## 2024-02-23 MED ORDER — SENNA 8.6 MG PO TABS
2.0000 | ORAL_TABLET | Freq: Every day | ORAL | Status: DC
  Administered 2024-02-23 – 2024-02-29 (×3): 17.2 mg via ORAL
  Filled 2024-02-23 (×6): qty 2

## 2024-02-23 MED ORDER — HALOPERIDOL LACTATE 5 MG/ML IJ SOLN
5.0000 mg | Freq: Once | INTRAMUSCULAR | Status: AC
  Administered 2024-02-24: 5 mg via INTRAVENOUS
  Filled 2024-02-23: qty 1

## 2024-02-23 MED ORDER — HYDROMORPHONE HCL 1 MG/ML IJ SOLN
0.5000 mg | INTRAMUSCULAR | Status: DC | PRN
  Administered 2024-02-23 – 2024-03-01 (×9): 0.5 mg via INTRAVENOUS
  Filled 2024-02-23 (×9): qty 1

## 2024-02-23 MED ORDER — MAGNESIUM OXIDE -MG SUPPLEMENT 400 (240 MG) MG PO TABS
400.0000 mg | ORAL_TABLET | Freq: Three times a day (TID) | ORAL | Status: DC
  Administered 2024-02-23 – 2024-03-01 (×18): 400 mg via ORAL
  Filled 2024-02-23 (×21): qty 1

## 2024-02-23 MED ORDER — BUSPIRONE HCL 10 MG PO TABS
10.0000 mg | ORAL_TABLET | Freq: Three times a day (TID) | ORAL | Status: DC
  Administered 2024-02-23 – 2024-03-01 (×18): 10 mg via ORAL
  Filled 2024-02-23 (×4): qty 1
  Filled 2024-02-23: qty 2
  Filled 2024-02-23: qty 1
  Filled 2024-02-23 (×2): qty 2
  Filled 2024-02-23 (×4): qty 1
  Filled 2024-02-23: qty 2
  Filled 2024-02-23 (×3): qty 1
  Filled 2024-02-23: qty 2
  Filled 2024-02-23 (×3): qty 1

## 2024-02-23 MED ORDER — ALBUTEROL SULFATE (2.5 MG/3ML) 0.083% IN NEBU: 2.5000 mg | INHALATION_SOLUTION | Freq: Four times a day (QID) | RESPIRATORY_TRACT | Status: DC | PRN

## 2024-02-23 MED ORDER — SALINE SPRAY 0.65 % NA SOLN
1.0000 | NASAL | Status: DC | PRN
  Administered 2024-02-23 – 2024-02-26 (×3): 1 via NASAL
  Filled 2024-02-23 (×3): qty 44

## 2024-02-23 MED ORDER — LINEZOLID 600 MG PO TABS
600.0000 mg | ORAL_TABLET | Freq: Two times a day (BID) | ORAL | Status: DC
  Administered 2024-02-23 – 2024-03-01 (×11): 600 mg via ORAL
  Filled 2024-02-23 (×15): qty 1

## 2024-02-23 MED ORDER — ZIPRASIDONE MESYLATE 20 MG IM SOLR
20.0000 mg | Freq: Once | INTRAMUSCULAR | Status: AC | PRN
  Administered 2024-02-24: 20 mg via INTRAMUSCULAR
  Filled 2024-02-23: qty 20

## 2024-02-23 MED ORDER — LORAZEPAM 1 MG PO TABS: 1.0000 mg | ORAL_TABLET | Freq: Three times a day (TID) | ORAL | Status: DC | PRN

## 2024-02-23 MED ORDER — PREGABALIN 100 MG PO CAPS
200.0000 mg | ORAL_CAPSULE | Freq: Two times a day (BID) | ORAL | Status: DC
  Administered 2024-02-23 – 2024-03-01 (×12): 200 mg via ORAL
  Filled 2024-02-23 (×14): qty 2

## 2024-02-23 MED ORDER — METHOCARBAMOL 500 MG PO TABS
500.0000 mg | ORAL_TABLET | Freq: Four times a day (QID) | ORAL | Status: DC | PRN
  Administered 2024-02-23: 500 mg via ORAL
  Filled 2024-02-23: qty 1

## 2024-02-23 MED ORDER — SODIUM BICARBONATE 650 MG PO TABS
650.0000 mg | ORAL_TABLET | Freq: Two times a day (BID) | ORAL | Status: DC
  Administered 2024-02-23 – 2024-03-01 (×11): 650 mg via ORAL
  Filled 2024-02-23 (×14): qty 1

## 2024-02-23 MED ORDER — NALOXONE HCL 0.4 MG/ML IJ SOLN: 0.4000 mg | INTRAMUSCULAR | Status: DC | PRN

## 2024-02-23 MED ORDER — OXYCODONE HCL ER 15 MG PO T12A
15.0000 mg | EXTENDED_RELEASE_TABLET | Freq: Three times a day (TID) | ORAL | Status: DC
  Administered 2024-02-23 – 2024-03-01 (×17): 15 mg via ORAL
  Filled 2024-02-23 (×19): qty 1

## 2024-02-23 MED ORDER — VITAMIN D 25 MCG (1000 UNIT) PO TABS
2000.0000 [IU] | ORAL_TABLET | Freq: Every day | ORAL | Status: DC
  Administered 2024-02-23 – 2024-03-01 (×6): 2000 [IU] via ORAL
  Filled 2024-02-23 (×8): qty 2

## 2024-02-23 MED ORDER — HYDROXYZINE HCL 25 MG PO TABS
25.0000 mg | ORAL_TABLET | Freq: Four times a day (QID) | ORAL | Status: DC | PRN
  Administered 2024-02-23 – 2024-02-27 (×4): 25 mg via ORAL
  Filled 2024-02-23 (×5): qty 1

## 2024-02-23 NOTE — Plan of Care (Signed)
   Problem: Clinical Measurements: Goal: Ability to maintain clinical measurements within normal limits will improve Outcome: Progressing   Problem: Activity: Goal: Risk for activity intolerance will decrease Outcome: Progressing   Problem: Nutrition: Goal: Adequate nutrition will be maintained Outcome: Progressing

## 2024-02-23 NOTE — Plan of Care (Signed)
  Problem: Safety: Goal: Non-violent Restraint(s) Outcome: Not Progressing   Problem: Education: Goal: Knowledge of General Education information will improve Description: Including pain rating scale, medication(s)/side effects and non-pharmacologic comfort measures Outcome: Not Progressing   Problem: Health Behavior/Discharge Planning: Goal: Ability to manage health-related needs will improve Outcome: Not Progressing  Patient is confused

## 2024-02-23 NOTE — Progress Notes (Signed)
 [name]                                                                               Gregory [name], is a 63 y.o. male, DOB - [birth year], FMW:[id number] Admit date - 02/21/2024    Outpatient Primary MD for the patient is [name], Provider, MD  LOS - 2  days    Brief summary   Gregory [name] is a 63 y.o. male with medical history significant of hypertension, hyperlipidemia, type 2 diabetes on insulin , cervical spinal stenosis, high output ileostomy, COPD, right AKA, chronic pain syndrome on multiple narcotics, stroke, and recent admission in 12/2023 for GBS bacteremia and encephalopathy p/w confusion/encephalopathy and found to have sepsis 2/2 LLE cellulitis.   In the ED, pt hypertensive, tachycardic, and tachypneic w/o hypoxia. Febrile to 104. Labs notable for Cr 2.13, WBC 16.6, and lactic acid 2. EDP started IV abx and requested medicine admission for sepsis.     Assessment & Plan    Assessment and Plan:  Sepsis secondary to possibly left lower extremity cellulitis/ possibly osteomyelitis of head of the 3rd of metatarsal and 4th and 5 th distal phalanx  Pt was febrile, tachycardic, tachypneic, with leukocytosis and elevated lactic acid on admission.  He was started on broad spectrum IV antibiotics.  Sepsis physiology improving.  CRP is 11.8, sed rate around 69. Resp panel is negative. UA IS NEGATIVE. Xrays of the foot show   Lucencies and cortical irregularities involving the head of the third metatarsal which is new from the previous exam, possible osteomyelitis versus nondisplaced fracture. Bony erosions along the tufts of the distal phalanx of the fourth and fifth digits, possible osteomyelitis Blood cultures are pending.  CXR shows mild vascular congestion.  Trend lactate.  Leukocytosis improving.   MRI tibia fibula ordered, pt refused.  X rays of the left foot ordered showing osteomyelitis.  Will get Orthopedics consult in am.    Chronic pain syndrome.   Restarted him on oxycontin  and prn oxycodone . Along with dilaudid  prn.    Hypertension Optimal.  Monitor.    Type 2 DM CBG (last 3)  Recent Labs    02/23/24 0914 02/23/24 1224 02/23/24 1556  GLUCAP 115* 124* 143*   Get A1c , start SSI.    Constipation Resume lactulose .    S/p ileostomy Monitor electrolytes.    Hyperlipidemia Resume statin.   Hyperkalemia Resolved.    Anemia of chronic disease Hemoglobin around 9. Monitor.   Estimated body mass index is 40.77 kg/m as calculated from the following:   Height as of 12/24/23: 5' 5 (1.651 m).   Weight as of this encounter: 111.1 kg.  Code Status: full code.  DVT Prophylaxis:  lovenox     Level of Care: Level of care: Progressive Family Communication: none at bedside. Called daughter and left message.   Disposition Plan:     Remains inpatient appropriate:  IV antibiotics and osteomyelitis.   Procedures:  None.   Consultants:   None.   Antimicrobials:   Anti-infectives (From admission, onward)    Start     Dose/Rate Route Frequency Ordered Stop   02/23/24 2200  linezolid  (ZYVOX ) tablet 600 mg  600 mg Oral Every 12 hours 02/23/24 1144     02/23/24 1200  vancomycin  (VANCOREADY) IVPB 1750 mg/350 mL  Status:  Discontinued        1,750 mg 175 mL/hr over 120 Minutes Intravenous Every 48 hours 02/21/24 1506 02/22/24 2006   02/23/24 1000  linezolid  (ZYVOX ) IVPB 600 mg  Status:  Discontinued        600 mg 300 mL/hr over 60 Minutes Intravenous Every 12 hours 02/22/24 2006 02/23/24 1144   02/21/24 2200  ceFEPIme  (MAXIPIME ) 2 g in sodium chloride  0.9 % 100 mL IVPB        2 g 200 mL/hr over 30 Minutes Intravenous Every 12 hours 02/21/24 1506     02/21/24 0915  vancomycin  (VANCOCIN ) IVPB 1000 mg/200 mL premix  Status:  Discontinued        1,000 mg 200 mL/hr over 60 Minutes Intravenous  Once 02/21/24 0904 02/21/24 0905   02/21/24 0915  ceFEPIme  (MAXIPIME ) 2 g in sodium chloride  0.9 % 100 mL IVPB        2  g 200 mL/hr over 30 Minutes Intravenous  Once 02/21/24 0904 02/21/24 1019   02/21/24 0915  vancomycin  (VANCOREADY) IVPB 2000 mg/400 mL        2,000 mg 200 mL/hr over 120 Minutes Intravenous  Once 02/21/24 0905 02/21/24 1352        Medications  Scheduled Meds:  aspirin  EC  81 mg Oral Daily   atorvastatin   80 mg Oral Daily   calcium  carbonate  1 tablet Oral TID WC   enoxaparin  (LOVENOX ) injection  50 mg Subcutaneous Q24H   escitalopram   5 mg Oral Daily   fluticasone   1 spray Each Nare Daily   insulin  aspart  0-9 Units Subcutaneous TID WC   lactulose   20 g Oral BID   levETIRAcetam   500 mg Oral BID   linezolid   600 mg Oral Q12H   OXcarbazepine   150 mg Oral BID   oxyCODONE   15 mg Oral Q12H   oxymetazoline   1 spray Each Nare BID   tamsulosin   0.4 mg Oral Daily   Continuous Infusions:  ceFEPime  (MAXIPIME ) IV 2 g (02/23/24 1043)   PRN Meds:.alum & mag hydroxide-simeth, HYDROmorphone  (DILAUDID ) injection, ondansetron  (ZOFRAN ) IV, oxyCODONE , sodium chloride     Subjective:   Gregory [name] was seen and examined today.  Confused, and agitated.  Objective:   Vitals:   02/23/24 1136 02/23/24 1201 02/23/24 1600 02/23/24 1621  BP: (!) 151/76  107/88   Pulse:  65 74 84  Resp: 20  18   Temp: 98.7 F (37.1 C)  98.2 F (36.8 C)   TempSrc: Oral  Oral   SpO2: 97% 96% 95% 97%  Weight:        Intake/Output Summary (Last 24 hours) at 02/23/2024 1749 Last data filed at 02/23/2024 1627 Gross per 24 hour  Intake --  Output 650 ml  Net -650 ml   Filed Weights   02/21/24 0905  Weight: 111.1 kg     Exam General exam: ill appearing gentleman. Not in distress.  Respiratory system: Clear to auscultation. Respiratory effort normal. Cardiovascular system: S1 & S2 heard, RRR.  Gastrointestinal system: Abdomen is soft, s/p ileostomy.  Central nervous system: Alert and oriented to person and place.  Extremities: right AKA, left foot swollen and tender.  Skin: No rashes,  Psychiatry:  agitated and restless.    Data Reviewed:  I have personally reviewed following labs and imaging studies   CBC Lab  Results  Component Value Date   WBC 11.6 (H) 02/22/2024   RBC 3.35 (L) 02/22/2024   HGB 9.6 (L) 02/22/2024   HCT 30.8 (L) 02/22/2024   MCV 91.9 02/22/2024   MCH 28.7 02/22/2024   PLT 162 02/22/2024   MCHC 31.2 02/22/2024   RDW 15.9 (H) 02/22/2024   LYMPHSABS 0.7 02/21/2024   MONOABS 0.6 02/21/2024   EOSABS 0.3 02/21/2024   BASOSABS 0.1 02/21/2024     Last metabolic panel Lab Results  Component Value Date   NA 141 02/22/2024   K 4.4 02/22/2024   CL 108 02/22/2024   CO2 21 (L) 02/22/2024   BUN 25 (H) 02/22/2024   CREATININE 1.99 (H) 02/22/2024   GLUCOSE 99 02/22/2024   GFRNONAA 37 (L) 02/22/2024   GFRAA 42 (L) 02/24/2020   CALCIUM  8.7 (L) 02/22/2024   PHOS 4.6 08/15/2023   PROT 6.8 02/21/2024   ALBUMIN  2.9 (L) 02/21/2024   BILITOT 0.8 02/21/2024   ALKPHOS 84 02/21/2024   AST 19 02/21/2024   ALT 16 02/21/2024   ANIONGAP 12 02/22/2024    CBG (last 3)  Recent Labs    02/23/24 0914 02/23/24 1224 02/23/24 1556  GLUCAP 115* 124* 143*      Coagulation Profile: Recent Labs  Lab 02/21/24 1740  INR 1.1     Radiology Studies: DG Foot Complete Left Result Date: 02/23/2024 CLINICAL DATA:  Pain. EXAM: LEFT FOOT - COMPLETE 3+ VIEW COMPARISON:  12/13/2023. FINDINGS: There has been transmetatarsal resection of all vein the first and second ray. There has been amputation of the middle and distal phalanx of the third digit and head of the proximal phalanx of the third digit. Erosions are noted at the tufts of the distal phalanx of the fourth and fifth digits. There are new lucencies and cortical irregularities involving the head of the third metatarsal. No dislocation is seen. Diffuse soft tissue swelling is noted about the foot. Vascular calcifications are noted. IMPRESSION: 1. Lucencies and cortical irregularities involving the head of the third metatarsal  which is new from the previous exam, possible osteomyelitis versus nondisplaced fracture. 2. Bony erosions along the tufts of the distal phalanx of the fourth and fifth digits, possible osteomyelitis. 3. Postsurgical changes as described above. Electronically Signed   By: Leita Birmingham M.D.   On: 02/23/2024 17:18       Elgie Butter M.D. [name] 02/23/2024, 5:49 PM  Available via Epic secure chat 7am-7pm After 7 pm, please refer to night coverage provider listed on amion.

## 2024-02-23 NOTE — Progress Notes (Signed)
 [facility] night cross cover note:   I was notified by the patient's RN that this patient continues to appear agitated, repeat/continuously yelling out, and that he is not amenable to PO meds at this time, noting his orders for prn PO Robaxin  as well as as needed p.o. Ativan .  In light of this, I changed his prn p.o. Robaxin  to as needed IV route. In the setting of shortage of IV Ativan , I have also changed his as needed p.o. Ativan  to as needed IV Valium .   Given his persistent agitation, I also ordered a dose of Haldol  5 mg IV x 1 along with an order for a one-time prn dose of IM Geodon  for refractory agitation.      Eva Pore, DO Hospitalist

## 2024-02-24 ENCOUNTER — Inpatient Hospital Stay (HOSPITAL_COMMUNITY)

## 2024-02-24 ENCOUNTER — Telehealth: Payer: Self-pay

## 2024-02-24 DIAGNOSIS — I10 Essential (primary) hypertension: Secondary | ICD-10-CM | POA: Diagnosis not present

## 2024-02-24 DIAGNOSIS — E785 Hyperlipidemia, unspecified: Secondary | ICD-10-CM | POA: Diagnosis not present

## 2024-02-24 DIAGNOSIS — M6289 Other specified disorders of muscle: Secondary | ICD-10-CM | POA: Diagnosis not present

## 2024-02-24 DIAGNOSIS — G894 Chronic pain syndrome: Secondary | ICD-10-CM | POA: Diagnosis not present

## 2024-02-24 DIAGNOSIS — A419 Sepsis, unspecified organism: Secondary | ICD-10-CM | POA: Diagnosis not present

## 2024-02-24 LAB — BASIC METABOLIC PANEL WITH GFR
Anion gap: 12 (ref 5–15)
BUN: 24 mg/dL — ABNORMAL HIGH (ref 8–23)
CO2: 21 mmol/L — ABNORMAL LOW (ref 22–32)
Calcium: 8.7 mg/dL — ABNORMAL LOW (ref 8.9–10.3)
Chloride: 110 mmol/L (ref 98–111)
Creatinine, Ser: 1.85 mg/dL — ABNORMAL HIGH (ref 0.61–1.24)
GFR, Estimated: 40 mL/min — ABNORMAL LOW (ref >60)
Glucose, Bld: 82 mg/dL (ref 70–99)
Potassium: 4.1 mmol/L (ref 3.5–5.1)
Sodium: 143 mmol/L (ref 135–145)

## 2024-02-24 LAB — CBC WITH DIFFERENTIAL/PLATELET
Abs Immature Granulocytes: 0.04 K/uL (ref 0.00–0.07)
Basophils Absolute: 0 K/uL (ref 0.0–0.1)
Basophils Relative: 1 %
Eosinophils Absolute: 0.3 K/uL (ref 0.0–0.5)
Eosinophils Relative: 5 %
HCT: 29.7 % — ABNORMAL LOW (ref 39.0–52.0)
Hemoglobin: 9.3 g/dL — ABNORMAL LOW (ref 13.0–17.0)
Immature Granulocytes: 1 %
Lymphocytes Relative: 27 %
Lymphs Abs: 1.4 K/uL (ref 0.7–4.0)
MCH: 29 pg (ref 26.0–34.0)
MCHC: 31.3 g/dL (ref 30.0–36.0)
MCV: 92.5 fL (ref 80.0–100.0)
Monocytes Absolute: 0.5 K/uL (ref 0.1–1.0)
Monocytes Relative: 9 %
Neutro Abs: 3.1 K/uL (ref 1.7–7.7)
Neutrophils Relative %: 57 %
Platelets: 149 K/uL — ABNORMAL LOW (ref 150–400)
RBC: 3.21 MIL/uL — ABNORMAL LOW (ref 4.22–5.81)
RDW: 14.9 % (ref 11.5–15.5)
Smear Review: NORMAL
WBC: 5.4 K/uL (ref 4.0–10.5)
nRBC: 0 % (ref 0.0–0.2)

## 2024-02-24 LAB — HEMOGLOBIN A1C
Hgb A1c MFr Bld: 5.9 % — ABNORMAL HIGH (ref 4.8–5.6)
Mean Plasma Glucose: 122.63 mg/dL

## 2024-02-24 LAB — LACTIC ACID, PLASMA: Lactic Acid, Venous: 0.5 mmol/L (ref 0.5–1.9)

## 2024-02-24 LAB — GLUCOSE, CAPILLARY
Glucose-Capillary: 111 mg/dL — ABNORMAL HIGH (ref 70–99)
Glucose-Capillary: 94 mg/dL (ref 70–99)
Glucose-Capillary: 205 mg/dL — ABNORMAL HIGH (ref 70–99)

## 2024-02-24 MED ORDER — HALOPERIDOL LACTATE 5 MG/ML IJ SOLN
2.0000 mg | Freq: Four times a day (QID) | INTRAMUSCULAR | Status: DC | PRN
  Administered 2024-02-24: 2 mg via INTRAVENOUS
  Filled 2024-02-24: qty 1

## 2024-02-24 MED ORDER — ENSURE PLUS HIGH PROTEIN PO LIQD
237.0000 mL | Freq: Two times a day (BID) | ORAL | Status: DC
  Administered 2024-02-25 – 2024-02-28 (×4): 237 mL via ORAL

## 2024-02-24 MED ORDER — ZIPRASIDONE MESYLATE 20 MG IM SOLR
20.0000 mg | Freq: Once | INTRAMUSCULAR | Status: AC | PRN
  Administered 2024-02-24: 20 mg via INTRAMUSCULAR
  Filled 2024-02-24: qty 20

## 2024-02-24 MED ORDER — DIAZEPAM 5 MG/ML IJ SOLN
10.0000 mg | Freq: Three times a day (TID) | INTRAMUSCULAR | Status: DC | PRN
  Administered 2024-02-24: 10 mg via INTRAVENOUS
  Filled 2024-02-24: qty 2

## 2024-02-24 NOTE — Progress Notes (Signed)
 [name]                                                                               [name], is a 63 y.o. male, DOB - September 26, 1960, FMW:[id number] Admit date - 02/21/2024    Outpatient Primary MD for the patient is [name], Provider, MD  LOS - 3  days    Brief summary   Gregory [name] is a 63 y.o. male with medical history significant of hypertension, hyperlipidemia, type 2 diabetes on insulin , cervical spinal stenosis, high output ileostomy, COPD, right AKA, chronic pain syndrome on multiple narcotics, stroke, and recent admission in 12/2023 for GBS bacteremia and encephalopathy p/w confusion/encephalopathy and found to have sepsis 2/2 LLE cellulitis.   In the ED, pt hypertensive, tachycardic, and tachypneic w/o hypoxia. Febrile to 104. Labs notable for Cr 2.13, WBC 16.6, and lactic acid 2. EDP started IV abx and requested medicine admission for sepsis.     Assessment & Plan    Assessment and Plan:  Sepsis secondary to possibly left lower extremity cellulitis/ possibly osteomyelitis of head of the 3rd of metatarsal and 4th and 5 th distal phalanx  Pt was febrile, tachycardic, tachypneic, with leukocytosis and elevated lactic acid on admission.  He was started on broad spectrum IV antibiotics. Continue the same. Sepsis physiology improving.  CRP is 11.8, sed rate around 69. Resp panel is negative. UA IS NEGATIVE. Xrays of the foot show   Lucencies and cortical irregularities involving the head of the third metatarsal which is new from the previous exam, possible osteomyelitis versus nondisplaced fracture. Bony erosions along the tufts of the distal phalanx of the fourth and fifth digits, possible osteomyelitis Blood cultures are pending.  CXR shows mild vascular congestion.  Lactic acid wnl.   Leukocytosis improving.   MRI tibia fibula ordered, pt refused.  X rays of the left foot ordered showing osteomyelitis.  Orthopedics Dr Harden consulted , plan for  ABI.    Acute metabolic encephalopathy:  Delirious from missing his meds.  Pt lethargic this am, from geodon  and haldol . Continue to monitor.    Chronic pain syndrome.  Restarted him on oxycontin  15 mg every 8 hours and prn oxycodone . Along with dilaudid  prn.    Hypertension Optimal.  Monitor.    Type 2 DM CBG (last 3)  Recent Labs    02/23/24 1224 02/23/24 1556 02/23/24 2126  GLUCAP 124* 143* 199*   A1c is 5.9%  start SSI.    Constipation Resume lactulose .    S/p ileostomy Monitor electrolytes.    Hyperlipidemia Resume statin.   Hyperkalemia Resolved.    Anemia of chronic disease Hemoglobin around 9. Monitor.   Estimated body mass index is 40.77 kg/m as calculated from the following:   Height as of 12/24/23: 5' 5 (1.651 m).   Weight as of this encounter: 111.1 kg.  Code Status: full code.  DVT Prophylaxis:  lovenox     Level of Care: Level of care: Progressive Family Communication: none at bedside. Called daughter and left message.   Disposition Plan:     Remains inpatient appropriate:  IV antibiotics and osteomyelitis.   Procedures:  None.   Consultants:  None.   Antimicrobials:   Anti-infectives (From admission, onward)    Start     Dose/Rate Route Frequency Ordered Stop   02/23/24 2200  linezolid  (ZYVOX ) tablet 600 mg        600 mg Oral Every 12 hours 02/23/24 1144     02/23/24 1200  vancomycin  (VANCOREADY) IVPB 1750 mg/350 mL  Status:  Discontinued        1,750 mg 175 mL/hr over 120 Minutes Intravenous Every 48 hours 02/21/24 1506 02/22/24 2006   02/23/24 1000  linezolid  (ZYVOX ) IVPB 600 mg  Status:  Discontinued        600 mg 300 mL/hr over 60 Minutes Intravenous Every 12 hours 02/22/24 2006 02/23/24 1144   02/21/24 2200  ceFEPIme  (MAXIPIME ) 2 g in sodium chloride  0.9 % 100 mL IVPB        2 g 200 mL/hr over 30 Minutes Intravenous Every 12 hours 02/21/24 1506     02/21/24 0915  vancomycin  (VANCOCIN ) IVPB 1000 mg/200 mL premix   Status:  Discontinued        1,000 mg 200 mL/hr over 60 Minutes Intravenous  Once 02/21/24 0904 02/21/24 0905   02/21/24 0915  ceFEPIme  (MAXIPIME ) 2 g in sodium chloride  0.9 % 100 mL IVPB        2 g 200 mL/hr over 30 Minutes Intravenous  Once 02/21/24 0904 02/21/24 1019   02/21/24 0915  vancomycin  (VANCOREADY) IVPB 2000 mg/400 mL        2,000 mg 200 mL/hr over 120 Minutes Intravenous  Once 02/21/24 0905 02/21/24 1352        Medications  Scheduled Meds:  aspirin  EC  81 mg Oral Daily   atorvastatin   80 mg Oral Daily   busPIRone   10 mg Oral TID   calcium  carbonate  1 tablet Oral TID WC   cholecalciferol   2,000 Units Oral Daily   enoxaparin  (LOVENOX ) injection  50 mg Subcutaneous Q24H   escitalopram   5 mg Oral Daily   fluticasone   1 spray Each Nare Daily   insulin  aspart  0-9 Units Subcutaneous TID WC   lactulose   20 g Oral BID   levETIRAcetam   500 mg Oral BID   lidocaine   1 patch Transdermal Q24H   linezolid   600 mg Oral Q12H   magnesium  oxide  400 mg Oral TID   melatonin  10 mg Oral QHS   multivitamin with minerals  1 tablet Oral Daily   OXcarbazepine   150 mg Oral BID   oxyCODONE   15 mg Oral Q8H   oxymetazoline   1 spray Each Nare BID   pregabalin   200 mg Oral BID   senna  2 tablet Oral QHS   sodium bicarbonate   650 mg Oral BID   tamsulosin   0.4 mg Oral Daily   Continuous Infusions:  ceFEPime  (MAXIPIME ) IV 2 g (02/23/24 2144)   PRN Meds:.albuterol , alum & mag hydroxide-simeth, diazepam , HYDROmorphone  (DILAUDID ) injection, hydrOXYzine , methocarbamol  (ROBAXIN ) injection, naloxone , ondansetron  (ZOFRAN ) IV, oxyCODONE , sodium chloride     Subjective:   Gregory [name] was seen and examined today. Lethargic, opens eyes to answer simple questions.  Objective:   Vitals:   02/24/24 0035 02/24/24 0036 02/24/24 0037 02/24/24 0441  BP:  137/82  (!) 162/66  Pulse: 67 76 81 64  Resp:  18  18  Temp:  98.6 F (37 C)  98.3 F (36.8 C)  TempSrc:  Oral  Axillary  SpO2: 94% 95% 93%  91%  Weight:  Intake/Output Summary (Last 24 hours) at 02/24/2024 1042 Last data filed at 02/23/2024 2029 Gross per 24 hour  Intake --  Output 1050 ml  Net -1050 ml   Filed Weights   02/21/24 0905  Weight: 111.1 kg     Exam General exam: Ill appearing gentleman, not in distress.  Respiratory system: Clear to auscultation. Respiratory effort normal. Cardiovascular system: S1 & S2 heard, RRR.  Gastrointestinal system: Abdomen is nondistended, soft and nontender. S/p ileostomy.  Central nervous system: Alert and oriented. No focal neurological deficits. Extremities: right AKA, left leg tender . Skin: No rashes, Psychiatry: Mood & affect appropriate.     Data Reviewed:  I have personally reviewed following labs and imaging studies   CBC Lab Results  Component Value Date   WBC 11.6 (H) 02/22/2024   RBC 3.35 (L) 02/22/2024   HGB 9.6 (L) 02/22/2024   HCT 30.8 (L) 02/22/2024   MCV 91.9 02/22/2024   MCH 28.7 02/22/2024   PLT 162 02/22/2024   MCHC 31.2 02/22/2024   RDW 15.9 (H) 02/22/2024   LYMPHSABS 0.7 02/21/2024   MONOABS 0.6 02/21/2024   EOSABS 0.3 02/21/2024   BASOSABS 0.1 02/21/2024     Last metabolic panel Lab Results  Component Value Date   NA 143 02/24/2024   K 4.1 02/24/2024   CL 110 02/24/2024   CO2 21 (L) 02/24/2024   BUN 24 (H) 02/24/2024   CREATININE 1.85 (H) 02/24/2024   GLUCOSE 82 02/24/2024   GFRNONAA 40 (L) 02/24/2024   GFRAA 42 (L) 02/24/2020   CALCIUM  8.7 (L) 02/24/2024   PHOS 4.6 08/15/2023   PROT 6.8 02/21/2024   ALBUMIN  2.9 (L) 02/21/2024   BILITOT 0.8 02/21/2024   ALKPHOS 84 02/21/2024   AST 19 02/21/2024   ALT 16 02/21/2024   ANIONGAP 12 02/24/2024    CBG (last 3)  Recent Labs    02/23/24 1224 02/23/24 1556 02/23/24 2126  GLUCAP 124* 143* 199*      Coagulation Profile: Recent Labs  Lab 02/21/24 1740  INR 1.1     Radiology Studies: DG Foot Complete Left Result Date: 02/23/2024 CLINICAL DATA:  Pain. EXAM: LEFT  FOOT - COMPLETE 3+ VIEW COMPARISON:  12/13/2023. FINDINGS: There has been transmetatarsal resection of all vein the first and second ray. There has been amputation of the middle and distal phalanx of the third digit and head of the proximal phalanx of the third digit. Erosions are noted at the tufts of the distal phalanx of the fourth and fifth digits. There are new lucencies and cortical irregularities involving the head of the third metatarsal. No dislocation is seen. Diffuse soft tissue swelling is noted about the foot. Vascular calcifications are noted. IMPRESSION: 1. Lucencies and cortical irregularities involving the head of the third metatarsal which is new from the previous exam, possible osteomyelitis versus nondisplaced fracture. 2. Bony erosions along the tufts of the distal phalanx of the fourth and fifth digits, possible osteomyelitis. 3. Postsurgical changes as described above. Electronically Signed   By: Leita Birmingham M.D.   On: 02/23/2024 17:18       Elgie Butter M.D. [name] 02/24/2024, 10:42 AM  Available via Epic secure chat 7am-7pm After 7 pm, please refer to night coverage provider listed on amion.

## 2024-02-24 NOTE — Progress Notes (Signed)
 [facility]    Plan will be for ABI's and DR. Harden will round on him tomorrow. ABI order placed.    Maurilio Deland Collet [name]

## 2024-02-24 NOTE — Plan of Care (Signed)
  Problem: Safety: Goal: Non-violent Restraint(s) Outcome: Progressing   Problem: Education: Goal: Knowledge of General Education information will improve Description: Including pain rating scale, medication(s)/side effects and non-pharmacologic comfort measures Outcome: Progressing   Problem: Health Behavior/Discharge Planning: Goal: Ability to manage health-related needs will improve Outcome: Progressing   Problem: Clinical Measurements: Goal: Ability to maintain clinical measurements within normal limits will improve Outcome: Progressing Goal: Will remain free from infection Outcome: Progressing Goal: Diagnostic test results will improve Outcome: Progressing Goal: Respiratory complications will improve Outcome: Progressing Goal: Cardiovascular complication will be avoided Outcome: Progressing   Problem: Activity: Goal: Risk for activity intolerance will decrease Outcome: Progressing   Problem: Nutrition: Goal: Adequate nutrition will be maintained Outcome: Progressing   Problem: Coping: Goal: Level of anxiety will decrease Outcome: Progressing   Problem: Elimination: Goal: Will not experience complications related to bowel motility Outcome: Progressing Goal: Will not experience complications related to urinary retention Outcome: Progressing   Problem: Pain Managment: Goal: General experience of comfort will improve and/or be controlled Outcome: Progressing   Problem: Safety: Goal: Ability to remain free from injury will improve Outcome: Progressing   Problem: Skin Integrity: Goal: Risk for impaired skin integrity will decrease Outcome: Progressing   Problem: Education: Goal: Ability to describe self-care measures that may prevent or decrease complications (Diabetes Survival Skills Education) will improve Outcome: Progressing Goal: Individualized Educational Video(s) Outcome: Progressing   Problem: Coping: Goal: Ability to adjust to condition or change  in health will improve Outcome: Progressing   Problem: Fluid Volume: Goal: Ability to maintain a balanced intake and output will improve Outcome: Progressing   Problem: Health Behavior/Discharge Planning: Goal: Ability to identify and utilize available resources and services will improve Outcome: Progressing Goal: Ability to manage health-related needs will improve Outcome: Progressing   Problem: Metabolic: Goal: Ability to maintain appropriate glucose levels will improve Outcome: Progressing   Problem: Nutritional: Goal: Maintenance of adequate nutrition will improve Outcome: Progressing Goal: Progress toward achieving an optimal weight will improve Outcome: Progressing   Problem: Skin Integrity: Goal: Risk for impaired skin integrity will decrease Outcome: Progressing   Problem: Tissue Perfusion: Goal: Adequacy of tissue perfusion will improve Outcome: Progressing

## 2024-02-24 NOTE — Telephone Encounter (Signed)
 Per Dr. Harden this pt is in the hospital and needs a left leg ABI. Would like for you to order this please.

## 2024-02-24 NOTE — Evaluation (Signed)
 Physical Therapy Evaluation Patient Details Name: Gregory [name] MRN: [id number] DOB: 19-Jan-1961 Today's Date: 02/24/2024  History of Present Illness  63 yo male from [place] admitted with AMS on 02/21/24 with sepsis due to LLE cellulitis. PMH includes: DM II, HTN, HLD, CKD II, COPD, s/p R AKA, L midfoot amputation, CVA, Afib, mesenteric ischemia s/p colostomy.  Clinical Impression  Pt in bed upon arrival of PT, but yelling and attempting to move his legs towards EOB. Pt able to pull on therapist to scoot towards EOB with minA, but needed up to maxA to complete scoot with use of bed pad and up to maxA to maintain static sitting at EOB due to frequent impulsive leaning outside BOS and leaning on unstable surfaces (rolling bedside table). The pt remained perseverative on different tasks through session, required assist to feed when attempting to eat, and needed from CGA to maxA to maintain sitting balance at EOB due to impulsivity and restlessness. Pt is difficult to redirect, unable to answer questions regarding PLOF or use of DME. Will likely need continued inpatient care, could benefit from skilled PT after d/c to return to prior level of seated balance and transfer ability.       If plan is discharge home, recommend the following: Two people to help with walking and/or transfers;Two people to help with bathing/dressing/bathroom;Assistance with feeding;Direct supervision/assist for medications management;Direct supervision/assist for financial management;Assist for transportation;Help with stairs or ramp for entrance;Supervision due to cognitive status;Assistance with cooking/housework   Can travel by private vehicle   No    Equipment Recommendations Wheelchair (measurements PT);Wheelchair cushion (measurements PT);Hoyer lift;Hospital bed  Recommendations for Other Services       Functional Status Assessment Patient has had a recent decline in their functional status and demonstrates the  ability to make significant improvements in function in a reasonable and predictable amount of time.     Precautions / Restrictions Precautions Precautions: Fall Recall of Precautions/Restrictions: Impaired Precaution/Restrictions Comments: ostomy Restrictions Weight Bearing Restrictions Per Provider Order: No      Mobility  Bed Mobility Overal bed mobility: Needs Assistance Bed Mobility: Supine to Sit, Sit to Supine     Supine to sit: Contact guard, Max assist, +2 for physical assistance, +2 for safety/equipment Sit to supine: Contact guard assist, Max assist, +2 for physical assistance, +2 for safety/equipment   General bed mobility comments: Patient ranging from CGA to max A of 2 with need for max multi modal cues in order to maintain balance, patient is disoriented and minimally agitated, but can be redirected with clear commands    Transfers                   General transfer comment: deferred due to safety      Balance Overall balance assessment: Mild deficits observed, not formally tested                                           Pertinent Vitals/Pain Pain Assessment Pain Assessment: Faces Faces Pain Scale: Hurts whole lot Pain Location: could not state Pain Descriptors / Indicators: Discomfort, Grimacing, Guarding, Moaning Pain Intervention(s): Limited activity within patient's tolerance, Monitored during session, Repositioned, RN gave pain meds during session    Home Living Family/patient expects to be discharged to:: Skilled nursing facility  Additional Comments: from [facility] SNF    Prior Function Prior Level of Function : Needs assist;Patient poor historian/Family not available             Mobility Comments: WC dependent on last admission in Jan 2025 and working on lateral transfers       Extremity/Trunk Assessment   Upper Extremity Assessment Upper Extremity Assessment: Defer to OT  evaluation    Lower Extremity Assessment Lower Extremity Assessment: RLE deficits/detail;LLE deficits/detail RLE Deficits / Details: chronic AKA, pt moving well spontaneously in bed, not following commands for MMT LLE Deficits / Details: pt with multiple abrasions, bleeding and suspect pt scatching. covered with bandage. not following commands for testing but withdraws with application of sock. is able to move against gravity    Cervical / Trunk Assessment Cervical / Trunk Assessment: Kyphotic  Communication   Communication Communication: No apparent difficulties    Cognition Arousal: Alert Behavior During Therapy: Impulsive, Agitated, Restless, Anxious   PT - Cognitive impairments: Difficult to assess, Awareness, Memory, Attention, Initiation, Sequencing, Problem solving, Safety/Judgement Difficult to assess due to:  (agitation)                     PT - Cognition Comments: pt perseverative on topics/stimulation, yelling out for megan upon our arrival, then I'm hurting but unable to answer any further questions about pain or indicate where. following ~25% of commands, not answering any questions. Following commands: Impaired Following commands impaired: Follows one step commands inconsistently, Follows multi-step commands inconsistently     Cueing Cueing Techniques: Verbal cues, Gestural cues, Tactile cues, Visual cues     General Comments General comments (skin integrity, edema, etc.): VSS on RA throghout, HR 65 to 89bpm    Exercises     Assessment/Plan    PT Assessment Patient needs continued PT services  PT Problem List Decreased strength;Decreased range of motion;Decreased activity tolerance;Decreased balance;Decreased mobility;Decreased safety awareness;Decreased knowledge of precautions;Decreased knowledge of use of DME;Pain;Decreased skin integrity       PT Treatment Interventions DME instruction;Gait training;Stair training;Functional mobility  training;Therapeutic activities;Therapeutic exercise;Balance training;Cognitive remediation;Patient/family education    PT Goals (Current goals can be found in the Care Plan section)  Acute Rehab PT Goals Patient Stated Goal: none stated other than to eat mac and cheese PT Goal Formulation: With patient Time For Goal Achievement: 03/09/24 Potential to Achieve Goals: Fair    Frequency Min 2X/week     Co-evaluation   Reason for Co-Treatment: Complexity of the patient's impairments (multi-system involvement);Necessary to address cognition/behavior during functional activity;For patient/therapist safety;To address functional/ADL transfers PT goals addressed during session: Mobility/safety with mobility;Balance;Proper use of DME OT goals addressed during session: ADL's and self-care;Strengthening/ROM       AM-PAC PT 6 Clicks Mobility  Outcome Measure Help needed turning from your back to your side while in a flat bed without using bedrails?: A Lot Help needed moving from lying on your back to sitting on the side of a flat bed without using bedrails?: A Lot Help needed moving to and from a bed to a chair (including a wheelchair)?: Total Help needed standing up from a chair using your arms (e.g., wheelchair or bedside chair)?: Total Help needed to walk in hospital room?: Total Help needed climbing 3-5 steps with a railing? : Total 6 Click Score: 8    End of Session   Activity Tolerance: Patient tolerated treatment well;Treatment limited secondary to agitation Patient left: in bed;with call bell/phone within reach;with bed alarm set  Nurse Communication: Mobility status (need for condom cath) PT Visit Diagnosis: Unsteadiness on feet (R26.81);Other abnormalities of gait and mobility (R26.89);Muscle weakness (generalized) (M62.81);Pain Pain - part of body:  (possibly LLE, not able to state)    Time: 8494-8464 PT Time Calculation (min) (ACUTE ONLY): 30 min   Charges:   PT  Evaluation $PT Eval Moderate Complexity: 1 Mod   PT General Charges $$ ACUTE PT VISIT: 1 Visit         [name] Call, PT, DPT   Acute Rehabilitation Department Office [phone] Secure Chat Communication Preferred  [name] JULIANNA Call 02/24/2024, 4:10 PM

## 2024-02-24 NOTE — Evaluation (Signed)
 Occupational Therapy Evaluation Patient Details Name: Gregory [name] MRN: [id number] DOB: 1961/05/10 Today's Date: 02/24/2024   History of Present Illness   63 yo male from [place] admitted with AMS on 02/21/24 secondary to ESBL UTI and serratia klebsiella multifocal PNA positive Rhinovirus PMH DM2 HTN HLD CKDII COPD s/p R AKA L midfoot amputation CVA Afib mesenteric ischemia s/p colostomy     Clinical Impressions Prior to this admission, patient from [facility]. Patient poor historian therefore all information obtained from chart. Currently, patient is confused, agitated, has decreased safety awareness, and with generalized weakness. Patient perseverative and yelling for the majority of the session, and able to transition with mod A of 2 to the EOB for feeding. Patient total A for feeding, requiring two sets of skilled hands as patient would impulsively grab at items, place his elbow in his food, or reach outside his base of support requiring significant intervention. Patient is currently total A for all ADLs, and signficant waxing and waning of assist when sitting EOB (CGA to max A of 2). Transfer not attempted due to cognition and safety, with bed placed in chair position at end of session. OT recommending return to SNF when medically appropriate. OT will continue to follow.      If plan is discharge home, recommend the following:   Two people to help with walking and/or transfers;Two people to help with bathing/dressing/bathroom;Assistance with cooking/housework;Assistance with feeding;Direct supervision/assist for medications management;Direct supervision/assist for financial management;Assist for transportation;Help with stairs or ramp for entrance;Supervision due to cognitive status     Functional Status Assessment   Patient has had a recent decline in their functional status and demonstrates the ability to make significant improvements in function in a reasonable and predictable  amount of time.     Equipment Recommendations   Other (comment) (defer to next venue)     Recommendations for Other Services         Precautions/Restrictions   Precautions Precautions: Fall Recall of Precautions/Restrictions: Impaired Precaution/Restrictions Comments: ostomy Restrictions Weight Bearing Restrictions Per Provider Order: No     Mobility Bed Mobility Overal bed mobility: Needs Assistance Bed Mobility: Supine to Sit, Sit to Supine     Supine to sit: Contact guard, Max assist, +2 for physical assistance, +2 for safety/equipment Sit to supine: Contact guard assist, Max assist, +2 for physical assistance, +2 for safety/equipment   General bed mobility comments: Patient ranging from CGA to max A of 2 with need for max multi modal cues in order to maintain balance, patient is disoriented and minimally agitated, but can be redirected with clear commands    Transfers                   General transfer comment: deferred due to safety      Balance Overall balance assessment: Mild deficits observed, not formally tested                                         ADL either performed or assessed with clinical judgement   ADL Overall ADL's : Needs assistance/impaired Eating/Feeding: Total assistance;Sitting   Grooming: Total assistance;Sitting   Upper Body Bathing: Total assistance;Sitting   Lower Body Bathing: Total assistance;Sitting/lateral leans   Upper Body Dressing : Total assistance;Sitting   Lower Body Dressing: Total assistance;Sitting/lateral leans   Toilet Transfer: Total assistance   Toileting- Clothing Manipulation and Hygiene: Total  assistance       Functional mobility during ADLs: Total assistance;+2 for physical assistance;+2 for safety/equipment;Cueing for safety;Cueing for sequencing General ADL Comments: Prior to this admission, patient from [facility] Patient poor historian therefore all information  obtained from chart. Currently, patient is confused, agitated, has decreased safety awareness, and with generalized weakness.  Patient perseverative and yelling for the majority of the session, and able to transition with mod A of 2 to the EOB for feeding. Patient total A for feeding, requiring two sets of skilled hands as patient would impulsively grab at items, place his elbow in his food, or reach outside his base of support requiring significant intervention. Patient is currently total A for all ADLs, and signficant waxing and waning of assist when sitting EOB (CGA to max A of 2). Transfer not attempted due to cognition and safety, with bed placed in chair position at end of session. OT recommending return to SNF when medically appropriate. OT will continue to follow.     Vision   Additional Comments: could not assess due to cognition     Perception Perception: Not tested       Praxis Praxis: Not tested       Pertinent Vitals/Pain Pain Assessment Pain Assessment: Faces Faces Pain Scale: Hurts whole lot Pain Location: could not state Pain Descriptors / Indicators: Discomfort, Grimacing, Guarding, Moaning Pain Intervention(s): Monitored during session, Limited activity within patient's tolerance, Repositioned, RN gave pain meds during session, Patient requesting pain meds-RN notified     Extremity/Trunk Assessment Upper Extremity Assessment Upper Extremity Assessment: Generalized weakness;Right hand dominant   Lower Extremity Assessment Lower Extremity Assessment: Defer to PT evaluation   Cervical / Trunk Assessment Cervical / Trunk Assessment: Kyphotic   Communication Communication Communication: No apparent difficulties   Cognition Arousal: Alert Behavior During Therapy: Impulsive, Agitated, Restless, Anxious Cognition: History of cognitive impairments, No family/caregiver present to determine baseline, Cognition impaired   Orientation impairments: Place, Time,  Situation Awareness: Intellectual awareness impaired, Online awareness impaired Memory impairment (select all impairments): Short-term memory, Non-declarative long-term memory, Declarative long-term memory, Working memory Attention impairment (select first level of impairment): Focused attention Executive functioning impairment (select all impairments): Initiation, Organization, Sequencing, Reasoning, Problem solving OT - Cognition Comments: Patient perseverative and yelling throughout entire session, max multi-modal cues to redirect                 Following commands: Impaired Following commands impaired: Follows one step commands inconsistently, Follows multi-step commands inconsistently     Cueing  General Comments   Cueing Techniques: Verbal cues;Gestural cues;Tactile cues;Visual cues  VSS on RA, HR 60s throughout   Exercises     Shoulder Instructions      Home Living Family/patient expects to be discharged to:: Skilled nursing facility                                 Additional Comments: from [facility] SN[facility]      Prior Functioning/Environment Prior Level of Function : Needs assist;Patient poor historian/Family not available                    OT Problem List: Decreased strength;Decreased range of motion;Decreased activity tolerance;Impaired balance (sitting and/or standing);Decreased cognition;Decreased coordination;Decreased safety awareness;Decreased knowledge of use of DME or AE;Decreased knowledge of precautions;Increased edema;Pain   OT Treatment/Interventions: Self-care/ADL training;Therapeutic exercise;Energy conservation;DME and/or AE instruction;Manual therapy;Therapeutic activities;Patient/family education;Balance training  OT Goals(Current goals can be found in the care plan section)   Acute Rehab OT Goals Patient Stated Goal: unable OT Goal Formulation: Patient unable to participate in goal setting Time For Goal  Achievement: 03/09/24 Potential to Achieve Goals: Fair ADL Goals Pt Will Perform Eating: Independently;sitting Pt Will Perform Grooming: Independently;sitting Pt Will Perform Upper Body Bathing: with set-up;sitting Additional ADL Goal #1: Patient will be able to follow 1-2 step commands consistently as a precursor to attempting ADLs or IADLs independently. Additional ADL Goal #2: Patient will be able to complete bed mobility at [facility] as a precursor to OOB activity.   OT Frequency:  Min 2X/week    Co-evaluation     PT goals addressed during session: Mobility/safety with mobility;Balance;Proper use of DME OT goals addressed during session: ADL's and self-care;Strengthening/ROM      AM-PAC OT 6 Clicks Daily Activity     Outcome Measure Help from another person eating meals?: Total Help from another person taking care of personal grooming?: Total Help from another person toileting, which includes using toliet, bedpan, or urinal?: Total Help from another person bathing (including washing, rinsing, drying)?: Total Help from another person to put on and taking off regular upper body clothing?: Total Help from another person to put on and taking off regular lower body clothing?: Total 6 Click Score: 6   End of Session Nurse Communication: Mobility status;Other (comment) (Condom cath off)  Activity Tolerance: Treatment limited secondary to agitation Patient left: in bed;with call bell/phone within reach;with bed alarm set  OT Visit Diagnosis: Unsteadiness on feet (R26.81);Other abnormalities of gait and mobility (R26.89);Repeated falls (R29.6);Muscle weakness (generalized) (M62.81);History of falling (Z91.81);Other symptoms and signs involving cognitive function;Adult, failure to thrive (R62.7);Pain Pain - part of body:  (could not state)                Time: 8493-8463 OT Time Calculation (min): 30 min Charges:  OT General Charges $OT Visit: 1 Visit OT Evaluation $OT Eval Moderate  Complexity: 1 Mod  Ronal Gift E. Marilyn Wing, OTR/L Acute Rehabilitation Services [phone]   Ronal Gift Salt 02/24/2024, 3:58 PM

## 2024-02-24 NOTE — Progress Notes (Signed)
 Patient is very agitated and inconsolable.  He is yelling, removing lines and tubes, attempting to get out of bed unassisted, refusing po Meds, removing colostomy/ ileostomy bag, and urinating in bed.  Patient is non- redirectable and PRN meds have been ineffective.  Due to patients behavior and confusion, we are unable to go to CT at this time.  Hospitalist notified and new orders were placed in.

## 2024-02-24 NOTE — Progress Notes (Signed)
 PT Cancellation Note  Patient Details Name: Gregory [name] MRN: [id number] DOB: 10/28/1960   Cancelled Treatment:    Reason Eval/Treat Not Completed: Other (comment) pt asleep this afternoon, and RN requesting PT not wake the patient at this time due to recent agitation. Will continue to follow and evaluate as appropriate.   [name] Call, PT, DPT   Acute Rehabilitation Department Office [phone] Secure Chat Communication Preferred   [name] JULIANNA Call 02/24/2024, 2:20 PM

## 2024-02-25 ENCOUNTER — Encounter (HOSPITAL_COMMUNITY): Payer: Self-pay | Admitting: Hospitalist

## 2024-02-25 ENCOUNTER — Other Ambulatory Visit: Payer: Self-pay

## 2024-02-25 DIAGNOSIS — I10 Essential (primary) hypertension: Secondary | ICD-10-CM | POA: Diagnosis not present

## 2024-02-25 DIAGNOSIS — A419 Sepsis, unspecified organism: Secondary | ICD-10-CM | POA: Diagnosis not present

## 2024-02-25 DIAGNOSIS — E785 Hyperlipidemia, unspecified: Secondary | ICD-10-CM | POA: Diagnosis not present

## 2024-02-25 DIAGNOSIS — G894 Chronic pain syndrome: Secondary | ICD-10-CM | POA: Diagnosis not present

## 2024-02-25 LAB — GLUCOSE, CAPILLARY
Glucose-Capillary: 104 mg/dL — ABNORMAL HIGH (ref 70–99)
Glucose-Capillary: 159 mg/dL — ABNORMAL HIGH (ref 70–99)
Glucose-Capillary: 137 mg/dL — ABNORMAL HIGH (ref 70–99)
Glucose-Capillary: 209 mg/dL — ABNORMAL HIGH (ref 70–99)

## 2024-02-25 LAB — VAS US ABI WITH/WO TBI: Left ABI: 0.96

## 2024-02-25 MED ORDER — PIPERACILLIN-TAZOBACTAM 3.375 G IVPB
3.3750 g | Freq: Three times a day (TID) | INTRAVENOUS | Status: DC
  Administered 2024-02-25 – 2024-03-01 (×14): 3.375 g via INTRAVENOUS
  Filled 2024-02-25 (×14): qty 50

## 2024-02-25 MED ORDER — DOXEPIN HCL 25 MG PO CAPS
50.0000 mg | ORAL_CAPSULE | Freq: Every day | ORAL | Status: DC
  Administered 2024-02-25 – 2024-02-29 (×5): 50 mg via ORAL
  Filled 2024-02-25: qty 2
  Filled 2024-02-25 (×3): qty 1
  Filled 2024-02-25 (×5): qty 2

## 2024-02-25 MED ORDER — QUETIAPINE FUMARATE 25 MG PO TABS: 25.0000 mg | ORAL_TABLET | Freq: Every day | ORAL | Status: DC

## 2024-02-25 MED ORDER — QUETIAPINE FUMARATE 25 MG PO TABS
25.0000 mg | ORAL_TABLET | Freq: Every evening | ORAL | Status: DC | PRN
  Administered 2024-02-25 – 2024-03-01 (×9): 25 mg via ORAL
  Filled 2024-02-25 (×9): qty 1

## 2024-02-25 MED ORDER — OLANZAPINE 5 MG PO TBDP: 5.0000 mg | ORAL_TABLET | Freq: Every evening | ORAL | Status: DC | PRN

## 2024-02-25 MED ORDER — SUZETRIGINE 50 MG PO TABS: 1.0000 | ORAL_TABLET | Freq: Two times a day (BID) | ORAL | Status: DC

## 2024-02-25 NOTE — Progress Notes (Signed)
 [facility] night cross cover note:  I was notified by RN that this patient is agitated, constantly yelling, pulling at their telemetry leads and peripheral IV, repeated attempting to get out of bed independently, with these behaviors refractory to attempts at verbal redirection and also refractory to existing orders for prn iv valium  5 mg and prn iv haldol  2 mg.  In the setting of associated interference with ongoing medical treatment posing potential harm to themself, including increased fall risk, I have placed order for soft bilateral wrist restraints.  Additionally, I have increased his dose of prn IV Valium  from 5 mg to 10 mg, and also added an order for a one-time dose of prn IM Geodon  for refractory agitation.    Eva Pore, DO Hospitalist

## 2024-02-25 NOTE — Progress Notes (Signed)
 [name]                                                                               Anes Rigel, is a 63 y.o. male, DOB - 1960-12-12, FMW:[id number] Admit date - 02/21/2024    Outpatient Primary MD for the patient is [name], Provider, MD  LOS - 4  days    Brief summary   Gregory [name] is a 63 y.o. male with medical history significant of hypertension, hyperlipidemia, type 2 diabetes on insulin , cervical spinal stenosis, high output ileostomy, COPD, right AKA, chronic pain syndrome on multiple narcotics, stroke, and recent admission in 12/2023 for GBS bacteremia and encephalopathy p/w confusion/encephalopathy and found to have sepsis 2/2 LLE cellulitis.   In the ED, pt hypertensive, tachycardic, and tachypneic w/o hypoxia. Febrile to 104. Labs notable for Cr 2.13, WBC 16.6, and lactic acid 2. EDP started IV abx and requested medicine admission for sepsis.   He ws found to have  possible Osteomyelitis in the third metatarsal head versus nondisplaced fracture. Bony erosions along the tufts of the distal phalanx of the fourth and fifth digits, possible osteomyelitis on x rays of the foot.   Orthopedics consulted, Dr Harden will see the patient. Meanwhile ABI'S ordered for further evaluation.    Of note since admission patient has been having episodes of agitation later in the day requiring Geodon  use and he was placed in restraints. This morning he is alert and oriented, and answering all questions appropriately.     Assessment & Plan    Assessment and Plan:  Sepsis secondary to possibly left lower extremity cellulitis/ possibly osteomyelitis of head of the 3rd of metatarsal and 4th and 5 th distal phalanx  Pt was febrile, tachycardic, tachypneic, with leukocytosis and elevated lactic acid on admission.  He was started on broad spectrum IV antibiotics. Continue the same. Sepsis physiology improving.  CRP is 11.8, sed rate around 69. Resp panel is negative. UA IS  NEGATIVE. Xrays of the foot show   Lucencies and cortical irregularities involving the head of the third metatarsal which is new from the previous exam, possible osteomyelitis versus nondisplaced fracture. Bony erosions along the tufts of the distal phalanx of the fourth and fifth digits, possible osteomyelitis Blood cultures are pending, negative so far.  CXR shows mild vascular congestion.  Lactic acid wnl.   Leukocytosis improving.   MRI tibia fibula ordered, pt refused as he is claustrophobic,  Orthopedics Dr Harden consulted, will see the patient, . Meanwhile ABI's ordered.     Acute metabolic encephalopathy: from sepsis  Differential include Hospital delirium ?  Overnight he was agitated , and haldol  did not help.  He spit out the atarax . He was given a dose of Geodon .  This am patient is more alert and oriented and requesting to be out of restraints.     Chronic pain syndrome.  Restarted him on oxycontin  15 mg every 8 hours and prn oxycodone , and dilaudid .    Essential Hypertension:  Well controlled.     Type 2 DM CBG (last 3)  Recent Labs    02/24/24 2130 02/25/24 0836 02/25/24 1123  GLUCAP 205* 104* 159*  A1c is 5.9%  start SSI.    Constipation Resume lactulose .    S/p ileostomy Monitor electrolytes.    Hyperlipidemia Resume statin.   Hyperkalemia Resolved.    Anemia of chronic disease Hemoglobin around 9. Monitor.   Estimated body mass index is 40.77 kg/m as calculated from the following:   Height as of 12/24/23: 5' 5 (1.651 m).   Weight as of this encounter: 111.1 kg.  Code Status: full code.  DVT Prophylaxis:  lovenox     Level of Care: Level of care: Progressive Family Communication: none at bedside. Updated daughter on 9/8.   Disposition Plan:     Remains inpatient appropriate:  IV antibiotics and osteomyelitis.   Procedures:  None.   Consultants:   Dr Harden.   Antimicrobials:   Anti-infectives (From admission, onward)     Start     Dose/Rate Route Frequency Ordered Stop   02/23/24 2200  linezolid  (ZYVOX ) tablet 600 mg        600 mg Oral Every 12 hours 02/23/24 1144     02/23/24 1200  vancomycin  (VANCOREADY) IVPB 1750 mg/350 mL  Status:  Discontinued        1,750 mg 175 mL/hr over 120 Minutes Intravenous Every 48 hours 02/21/24 1506 02/22/24 2006   02/23/24 1000  linezolid  (ZYVOX ) IVPB 600 mg  Status:  Discontinued        600 mg 300 mL/hr over 60 Minutes Intravenous Every 12 hours 02/22/24 2006 02/23/24 1144   02/21/24 2200  ceFEPIme  (MAXIPIME ) 2 g in sodium chloride  0.9 % 100 mL IVPB        2 g 200 mL/hr over 30 Minutes Intravenous Every 12 hours 02/21/24 1506     02/21/24 0915  vancomycin  (VANCOCIN ) IVPB 1000 mg/200 mL premix  Status:  Discontinued        1,000 mg 200 mL/hr over 60 Minutes Intravenous  Once 02/21/24 0904 02/21/24 0905   02/21/24 0915  ceFEPIme  (MAXIPIME ) 2 g in sodium chloride  0.9 % 100 mL IVPB        2 g 200 mL/hr over 30 Minutes Intravenous  Once 02/21/24 0904 02/21/24 1019   02/21/24 0915  vancomycin  (VANCOREADY) IVPB 2000 mg/400 mL        2,000 mg 200 mL/hr over 120 Minutes Intravenous  Once 02/21/24 0905 02/21/24 1352        Medications  Scheduled Meds:  aspirin  EC  81 mg Oral Daily   atorvastatin   80 mg Oral Daily   busPIRone   10 mg Oral TID   calcium  carbonate  1 tablet Oral TID WC   cholecalciferol   2,000 Units Oral Daily   doxepin   50 mg Oral QHS   enoxaparin  (LOVENOX ) injection  50 mg Subcutaneous Q24H   escitalopram   5 mg Oral Daily   feeding supplement  237 mL Oral BID BM   fluticasone   1 spray Each Nare Daily   insulin  aspart  0-9 Units Subcutaneous TID WC   lactulose   20 g Oral BID   levETIRAcetam   500 mg Oral BID   lidocaine   1 patch Transdermal Q24H   linezolid   600 mg Oral Q12H   magnesium  oxide  400 mg Oral TID   melatonin  10 mg Oral QHS   multivitamin with minerals  1 tablet Oral Daily   OXcarbazepine   150 mg Oral BID   oxyCODONE   15 mg Oral Q8H    oxymetazoline   1 spray Each Nare BID   pregabalin   200 mg Oral BID  senna  2 tablet Oral QHS   sodium bicarbonate   650 mg Oral BID   tamsulosin   0.4 mg Oral Daily   Continuous Infusions:  ceFEPime  (MAXIPIME ) IV 2 g (02/25/24 1058)   PRN Meds:.albuterol , alum & mag hydroxide-simeth, diazepam , haloperidol  lactate, HYDROmorphone  (DILAUDID ) injection, hydrOXYzine , methocarbamol  (ROBAXIN ) injection, naloxone , ondansetron  (ZOFRAN ) IV, oxyCODONE , sodium chloride     Subjective:   Gregory [name] was seen and examined today. Alert and answering questions appropriately, still a little agitated but answering all questions.  He denies any pain today.  He wanted to talk to the daughter and asked me to call her to come.    Objective:   Vitals:   02/24/24 1553 02/25/24 0038 02/25/24 0500 02/25/24 1124  BP: (!) 137/96 120/64 (!) 179/66 (!) 153/72  Pulse:    67  Resp:  18 18 20   Temp: 98.5 F (36.9 C) 98 F (36.7 C) 98.6 F (37 C) 99 F (37.2 C)  TempSrc: Axillary Axillary Axillary Oral  SpO2:    95%  Weight:        Intake/Output Summary (Last 24 hours) at 02/25/2024 1207 Last data filed at 02/25/2024 1100 Gross per 24 hour  Intake 350 ml  Output 1260 ml  Net -910 ml   Filed Weights   02/21/24 0905  Weight: 111.1 kg     Exam General exam: Appears calm and comfortable  Respiratory system: Clear to auscultation. Respiratory effort normal. Cardiovascular system: S1 & S2 heard, RRR.  Gastrointestinal system: Abdomen is nondistended, soft and nontender.  Central nervous system: Alert and oriented to person, place only. .  Extremities: left lower extremity swelling.  Skin: no rashes.  Psychiatry: restless.      Data Reviewed:  I have personally reviewed following labs and imaging studies   CBC Lab Results  Component Value Date   WBC 5.4 02/24/2024   RBC 3.21 (L) 02/24/2024   HGB 9.3 (L) 02/24/2024   HCT 29.7 (L) 02/24/2024   MCV 92.5 02/24/2024   MCH 29.0 02/24/2024   PLT  149 (L) 02/24/2024   MCHC 31.3 02/24/2024   RDW 14.9 02/24/2024   LYMPHSABS 1.4 02/24/2024   MONOABS 0.5 02/24/2024   EOSABS 0.3 02/24/2024   BASOSABS 0.0 02/24/2024     Last metabolic panel Lab Results  Component Value Date   NA 143 02/24/2024   K 4.1 02/24/2024   CL 110 02/24/2024   CO2 21 (L) 02/24/2024   BUN 24 (H) 02/24/2024   CREATININE 1.85 (H) 02/24/2024   GLUCOSE 82 02/24/2024   GFRNONAA 40 (L) 02/24/2024   GFRAA 42 (L) 02/24/2020   CALCIUM  8.7 (L) 02/24/2024   PHOS 4.6 08/15/2023   PROT 6.8 02/21/2024   ALBUMIN  2.9 (L) 02/21/2024   BILITOT 0.8 02/21/2024   ALKPHOS 84 02/21/2024   AST 19 02/21/2024   ALT 16 02/21/2024   ANIONGAP 12 02/24/2024    CBG (last 3)  Recent Labs    02/24/24 2130 02/25/24 0836 02/25/24 1123  GLUCAP 205* 104* 159*      Coagulation Profile: Recent Labs  Lab 02/21/24 1740  INR 1.1     Radiology Studies: VAS US  ABI WITH/WO TBI Result Date: 02/24/2024  LOWER EXTREMITY DOPPLER STUDY Patient Name:  Gregory [name]  Date of Exam:   02/24/2024 Medical Rec #: [id number]     Accession #:    [id number] Date of Birth: 1960-11-17    Patient Gender: M Patient Age:   18 years Exam Location:  [facility] Procedure:      VAS US  ABI WITH/WO TBI Referring Phys: EMMA COLLINS --------------------------------------------------------------------------------  Indications: Ischemic lower extremity. right AKA High Risk Factors: Hypertension, hyperlipidemia, Diabetes, current smoker. Other Factors: Patient unable to provide history.  Limitations: Today's exam was limited due to patient unable to lie flat, patient              intolerant to cuff pressure and involuntary patient movement. Comparison Study: Previous study on 10.9.2018. Performing Technologist: Edilia Elden Appl  Examination Guidelines: A complete evaluation includes at minimum, Doppler waveform signals and systolic blood pressure reading at the level of bilateral brachial, anterior tibial,  and posterior tibial arteries, when vessel segments are accessible. Bilateral testing is considered an integral part of a complete examination. Photoelectric Plethysmograph (PPG) waveforms and toe systolic pressure readings are included as required and additional duplex testing as needed. Limited examinations for reoccurring indications may be performed as noted.  ABI Findings: +--------+------------------+-----+---------+--------+ Right   Rt Pressure (mmHg)IndexWaveform Comment  +--------+------------------+-----+---------+--------+ [id number]                    triphasic         +--------+------------------+-----+---------+--------+ +--------+------------------+-----+---------+-------+ Left    Lt Pressure (mmHg)IndexWaveform Comment +--------+------------------+-----+---------+-------+ [id number]                    triphasic        +--------+------------------+-----+---------+-------+ PTA     254               1.44 triphasic        +--------+------------------+-----+---------+-------+ DP      170               0.96 triphasic        +--------+------------------+-----+---------+-------+ +-------+-----------+-----------+------------+------------+ ABI/TBIToday's ABIToday's TBIPrevious ABIPrevious TBI +-------+-----------+-----------+------------+------------+ Right  AKA        AKA                                 +-------+-----------+-----------+------------+------------+ Left   0.96       N/A                                 +-------+-----------+-----------+------------+------------+  Limited exam due to right above the knee amputation, and left foot/toe deformation. Unable to obtain toe pressure or waveforms.  Summary: Right:  Above the knee amputation. Left: Resting left ankle-brachial index is within normal range.  Left foot/toe deformity, no toe pressure and waveforms obtained. *See table(s) above for measurements and observations.     Preliminary    DG Foot  Complete Left Result Date: 02/23/2024 CLINICAL DATA:  Pain. EXAM: LEFT FOOT - COMPLETE 3+ VIEW COMPARISON:  12/13/2023. FINDINGS: There has been transmetatarsal resection of all vein the first and second ray. There has been amputation of the middle and distal phalanx of the third digit and head of the proximal phalanx of the third digit. Erosions are noted at the tufts of the distal phalanx of the fourth and fifth digits. There are new lucencies and cortical irregularities involving the head of the third metatarsal. No dislocation is seen. Diffuse soft tissue swelling is noted about the foot. Vascular calcifications are noted. IMPRESSION: 1. Lucencies and cortical irregularities involving the head of the third metatarsal which is new from the previous exam, possible osteomyelitis versus nondisplaced fracture. 2. Bony erosions along the tufts of  the distal phalanx of the fourth and fifth digits, possible osteomyelitis. 3. Postsurgical changes as described above. Electronically Signed   By: Leita Birmingham M.D.   On: 02/23/2024 17:18       Elgie Butter M.D. [name] 02/25/2024, 12:07 PM  Available via Epic secure chat 7am-7pm After 7 pm, please refer to night coverage provider listed on amion.

## 2024-02-25 NOTE — Consult Note (Addendum)
 [facility] Psychiatric Consult Initial  Patient Name: .MILIND RAETHER  MRN: [id number]  DOB: January 26, 1961  Consult Order details: Severe agitation.   Mode of Visit: In person   Psychiatry Consult Evaluation  Service Date: February 25, 2024 LOS:  LOS: 4 days  Chief Complaint I'm fine. I will tell you why they wanted you to come see me.  Primary Psychiatric Diagnoses  Hyperactive delirium  2.   History of MDD   Assessment  ATHAN CASALINO is a 63 y.o. male admitted: Medicallyfor 02/21/2024  8:49 AM for acute agitated encephalopathy. 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   The patient's current presentation of inattention, waxing and waning mental status, and changes over the past several days is most consistent with hyperactive delirium, likely related to a combination of prior infection, multiple medications (notably cefepime ), and pain. His primary psychiatric diagnoses over the years have been depression and anxiety. Both the patient and collateral deny any history of psychiatric hospitalizations or suicide attempts.  The patient reports a history of depression but is unable to recall which medications he has previously taken or whether they were effective. Review of his most recent admission indicates that outpatient psychotropic medications included Lexapro  20 mg daily, Oxycodone  15 mg TID, Lyrica  25 mg TID and 100 mg BID, Seroquel  100 mg at bedtime, with PRN Haldol  5 mg every 6 hours and PRN Oxycodone  5 mg every 6 hours. Historically, he was described as fully alert and mostly oriented while on this regimen. Given his recent stay in a nursing home, it is likely he was compliant with medications prior to this admission.  On initial examination, the patient was alert, calm, and  cooperative. However, he was unable to participate in sustained attention testing and reported ongoing pain. At the time of this evaluation, he was out of restraints and had remained out for most of the day. His mental status continues to fluctuate with waxing and waning orientation and attention.   Diagnoses:  Active Hospital problems: Principal Problem:   Sepsis (HCC)    Plan  ## Psychiatric Medication Recommendations:  --Continue current medications.  -Will continue seroquel  25mg  po at bedtime and repeat x 1 prn.   ## Medical Decision Making Capacity: Not specifically addressed in this encounter  ## Further Work-up:  - Recommend discontinuation of cefepime  as it maybe contributing to his condition.   -- most recent EKG on 9/8 had Qtc of 417 -- Pertinent labwork reviewed earlier this admission includes: CMP, CBC (9.3 hgb), Sed rate (69),A1c (5.9) TSH, UDS  ## Disposition:-- There are no psychiatric contraindications to discharge at this time  ## Behavioral / Environmental: -Delirium Precautions: Delirium Interventions for Nursing and Staff: - RN to open blinds every AM. - To Bedside: Glasses, hearing aide, and pt's own shoes. Make available to patients. when possible and encourage use. - Encourage po fluids when appropriate, keep fluids within reach. - OOB to chair with meals. - Passive ROM exercises to all extremities with AM & PM care. - RN to assess orientation to person, time and place QAM and PRN. - Recommend extended visitation hours with familiar family/friends as feasible. - Staff to minimize disturbances at night. Turn off television when pt asleep or when not in use.   ## Safety and Observation  Level:  - Based on my clinical evaluation, I estimate the patient to be at moderate risk of self harm in the current setting due to agitation. - At this time, we recommend safety observation. This decision is based on my review of the chart including patient's history and current  presentation, interview of the patient, mental status examination, and consideration of suicide risk including evaluating suicidal ideation, plan, intent, suicidal or self-harm behaviors, risk factors, and protective factors. This judgment is based on our ability to directly address suicide risk, implement suicide prevention strategies, and develop a safety plan while the patient is in the clinical setting. Please contact our team if there is a concern that risk level has changed.  CSSR Risk Category:   Suicide Risk Assessment: Patient has following modifiable risk factors for suicide: recklessness, which we are addressing by optimizing his medications Patient has following non-modifiable or demographic risk factors for suicide: male gender Patient has the following protective factors against suicide: Supportive family, no history of suicide attempts, and no history of NSSIB  Thank you for this consult request. Recommendations have been communicated to the primary team.  We will continue to follow at this time.   Majel GORMAN Ramp, FNP,                                                             History of Present Illness  Relevant Aspects of [facility] Course:  Sylvanus L. Nix is a 63 year old male with multiple comorbidities including diabetes on insulin , COPD, right AKA, chronic pain on narcotics, and recent admission for GBS bacteremia/encephalopathy. He presented with confusion and was found septic from LLE cellulitis.  In the ED, he was febrile to 104F, hypertensive, tachycardic, tachypneic, with Cr 2.13, WBC 16.6, and lactate 2. IV antibiotics were started, and he was admitted for sepsis. Foot x-rays showed possible osteomyelitis of the third metatarsal head and erosions of the fourth/fifth distal phalanges. Orthopedics was consulted (Dr. Harden), and ABIs were ordered.  Since admission, he has had episodes of agitation requiring Geodon  and restraints, though today he is alert,  oriented, and answering questions appropriately.  Patient Report:  Patient is seen in his bed. He is awake and alert, very jovial. He was observed to be talking on the phone to his daughter. He did want his daughter to help with decision making and be involved in the visit. This provider called her using google voice phone while in the room. He would add in additional information and participate with her. He seemed to do very well and has clear insight. He was able to follow instructions directly without prompts and call for assistance. He states that he gets aggravated when those people put me in restraints. It makes me so angry. He is eating and drinking without difficulty.  He denies si/hi/avh.  Psych ROS:  Depression: reports always had depression Anxiety: reports anxiety  Mania (lifetime and current): denies  Psychosis: (lifetime and current): none  Collateral information:  Contacted daughter [name] 02/25/2024 She reports the patient is back to baseline and is surprised at how well he is doing. She is hesitant to give additional medications tonight and prefers to observe. Medications will be changed to PRN per daughter's request. We discussed the multifactorial causes of delirium and his current clinical  presentation. She confirms he is from a facility and would like him to return there when stable.   Review of Systems  Psychiatric/Behavioral: Negative.    All other systems reviewed and are negative.   Reports pain in his leg  Psychiatric and Social History  Psychiatric History:  Information collected from patient, chart review  Prev Dx/Sx: MDD Current Psych Provider: none Home Meds (current): scheduled lexapro  20, oxy 15 TID, lyrica  25 TID, 100 BID, seroquel  100 at bedtime, PRN haldol  5 Q6H, oxy 5 Q6H Previous Med Trials: wellbutrin , cymbalta , gabapentin  Therapy: unknown  Prior Psych Hospitalization: denies  Prior Self Harm: denies Prior Violence: denies  Family Psych  History: denies, reports sister struggles with substance use  Family Hx suicide: unknown  Social History:  Living Situation: Lives at [facility] for several years, no access to anything else  Access to weapons/lethal means: none   Substance History Denies substance use history, UDS+opiates, BZD. Patient required ativan , versed .  -In the past he used marijuana frequently, but then became dependent on pain meds   Exam Findings  Vital Signs:  Temp:  [98 F (36.7 C)-99 F (37.2 C)] 99 F (37.2 C) (09/09 1124) Pulse Rate:  [67] 67 (09/09 1124) Resp:  [18-20] 20 (09/09 1124) BP: (120-179)/(64-96) 153/72 (09/09 1124) SpO2:  [95 %] 95 % (09/09 1124) Blood pressure (!) 153/72, pulse 67, temperature 99 F (37.2 C), temperature source Oral, resp. rate 20, weight 111.1 kg, SpO2 95%. Body mass index is 40.77 kg/m.  Physical Exam Constitutional:      Appearance: Normal appearance. He is obese.  HENT:     Head: Normocephalic.  Pulmonary:     Effort: Pulmonary effort is normal.  Neurological:     General: No focal deficit present.     Mental Status: He is alert and oriented to person, place, and time. Mental status is at baseline.  Psychiatric:        Mood and Affect: Mood normal.        Behavior: Behavior normal.        Thought Content: Thought content normal.        Judgment: Judgment normal.    Mental Status Exam:  Orientation:  A&O x 3  Memory:  Fairly nomal  Concentration:  Concentration: Fair  Recall:  Fair  Attention  Fair  Eye Contact:  Fair  Speech:   normal   Language:   normal    Volume:   normal   Mood:  normal  Affect:  Appropriate and Congruent  Thought Process:  linear  Thought Content:  Logical  Suicidal Thoughts:  Denies  Homicidal Thoughts:  Denies   Judgement:  improving  Insight:  fair  Psychomotor Activity:  Normal  Akathisia:  No  Fund of Knowledge:    Denied perceptual disturbances Assets: moderate ability to communicate needs  Cognition:   Improving, mild  ADL's:  Needs assistance  AIMS (if indicated):        Other History   These have been pulled in through the EMR, reviewed, and updated if appropriate.  Family History:  The patient's family history includes Bone cancer in his father; Cancer in his father, mother, paternal grandfather, and another family member; Diabetes in his mother and another family member; Heart disease in his mother; Prostate cancer in his father.  Medical History: 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)    Acquired absence of right leg below knee (HCC)  Acute osteomyelitis of calcaneum, right (HCC) 10/02/2016   Acute respiratory failure (HCC)    Amputation stump infection (HCC) 08/12/2018   Anemia    Basal cell carcinoma, eyelid    Cancer (HCC)    Candidiasis 08/12/2018   CHF (congestive heart failure) (HCC)    chronic diastolic    Chronic back pain    Chronic kidney disease    stage 3    Chronic multifocal osteomyelitis (HCC)    of ankle and foot    Chronic pain syndrome    COPD (chronic obstructive pulmonary disease) (HCC)    DDD (degenerative disc disease), lumbar    Depression    major depressive disorder    Diabetes mellitus without complication (HCC)    type 2    Diabetic ulcer of toe of left foot (HCC) 10/02/2016   Difficult intubation    Difficulty in walking, not elsewhere classified    Epidural abscess 10/02/2016   Foot drop, bilateral    Foot drop, right 12/27/2015   GERD (gastroesophageal reflux disease)    Herpesviral vesicular dermatitis    History of COVID-19 03/15/2022   Hyperlipidemia    Hyperlipidemia    Hypertension    Insomnia    Malignant melanoma of other parts of face (HCC)    Melanoma (HCC)    MRSA bacteremia    Muscle weakness (generalized)    Nasal congestion    Necrosis of toe (HCC) 07/08/2018   Neuromuscular dysfunction of bladder    Osteomyelitis (HCC)    Osteomyelitis (HCC)    Osteomyelitis (HCC)     right BKA   Other idiopathic peripheral autonomic neuropathy    Retention of urine, unspecified    Squamous cell carcinoma of skin    Stroke (HCC)    Unsteadiness on feet    Urinary retention    Urinary retention    Urinary tract infection    Wears dentures    Wears glasses     Surgical History: Past Surgical History:  Procedure Laterality Date   AMPUTATION Left 03/29/2017   Procedure: LEFT GREAT TOE AMPUTATION AT METATARSOPHALANGEAL JOINT;  Surgeon: Harden Jerona GAILS, MD;  Location: [facility] OR;  Service: Orthopedics;  Laterality: Left;   AMPUTATION Right 03/29/2017   Procedure: RIGHT BELOW KNEE AMPUTATION;  Surgeon: Harden Jerona GAILS, MD;  Location: [facility] OR;  Service: Orthopedics;  Laterality: Right;   AMPUTATION Left 06/06/2018   Procedure: LEFT 2ND TOE AMPUTATION;  Surgeon: Harden Jerona GAILS, MD;  Location: [facility] OR;  Service: Orthopedics;  Laterality: Left;   AMPUTATION Right 11/19/2018   Procedure: AMPUTATION ABOVE KNEE;  Surgeon: Harden Jerona GAILS, MD;  Location: [facility] OR;  Service: Orthopedics;  Laterality: Right;   ANTERIOR CERVICAL CORPECTOMY N/A 11/25/2015   Procedure: ANTERIOR CERVICAL FIVE CORPECTOMY Cervical four - six fusion;  Surgeon: Gerldine Maizes, MD;  Location: [facility];  Service: Neurosurgery;  Laterality: N/A;  ANTERIOR CERVICAL FIVE CORPECTOMY Cervical four - six fusion   APPLICATION OF WOUND VAC Right 10/22/2018   Procedure: Application Of Wound Vac;  Surgeon: Harden Jerona GAILS, MD;  Location: [facility] OR;  Service: Orthopedics;  Laterality: Right;   CYSTOSCOPY WITH BIOPSY N/A 05/01/2022   Procedure: CYSTOSCOPY WITH URETHRAL BIOPSY;  Surgeon: Elisabeth Valli BIRCH, MD;  Location: [facility]S;  Service: Urology;  Laterality: N/A;  1 HR   CYSTOSCOPY WITH RETROGRADE URETHROGRAM N/A 04/25/2018   Procedure: CYSTOSCOPY WITH RETROGRADE URETHROGRAM/ BALLOON DILATION;  Surgeon: Ottelin, Mark, MD;  Location: [facility] OR[facility];  Service: Urology;  Laterality:  N/A;   CYSTOSCOPY WITH URETHRAL DILATATION N/A 05/01/2022    Procedure: BALLOON DILATION WITH  OPTILUME;  Surgeon: Elisabeth Valli BIRCH, MD;  Location: [facility];  Service: Urology;  Laterality: N/A;   LAPAROTOMY N/A 12/03/2022   Procedure: EXPLORATORY LAPAROTOMY  total abdominal colectomy with end ileostomy;  Surgeon: Kinsinger, Herlene Righter, MD;  Location: [facility] OR;  Service: General;  Laterality: N/A;   MULTIPLE TOOTH EXTRACTIONS     POSTERIOR CERVICAL FUSION/FORAMINOTOMY N/A 11/29/2015   Procedure: Cervical Three-Cervical Seven Posterior Cervical Laminectomy with Fusion;  Surgeon: Gerldine Maizes, MD;  Location: [facility];  Service: Neurosurgery;  Laterality: N/A;  Cervical Three-Cervical Seven Posterior Cervical Laminectomy with Fusion   STUMP REVISION Right 10/22/2018   Procedure: REVISION RIGHT BELOW KNEE AMPUTATION;  Surgeon: Harden Jerona GAILS, MD;  Location: [facility] OR;  Service: Orthopedics;  Laterality: Right;   STUMP REVISION Right 12/05/2018   Procedure: Revision Right Above Knee Amputation;  Surgeon: Harden Jerona GAILS, MD;  Location: [facility] OR;  Service: Orthopedics;  Laterality: Right;   STUMP REVISION Right 05/29/2019   Procedure: REVISION RIGHT ABOVE KNEE AMPUTATION;  Surgeon: Harden Jerona GAILS, MD;  Location: [facility] OR;  Service: Orthopedics;  Laterality: Right;   STUMP REVISION Right 06/24/2019   Procedure: STUMP REVISION;  Surgeon: Harden Jerona GAILS, MD;  Location: [facility] OR;  Service: Orthopedics;  Laterality: Right;     Medications:   Current Facility-Administered Medications:    albuterol  (PROVENTIL ) (2.5 MG/3ML) 0.083% nebulizer solution 2.5 mg, 2.5 mg, Nebulization, Q6H PRN, Akula, Vijaya, MD   alum & mag hydroxide-simeth (MAALOX/MYLANTA) 200-200-20 MG/5ML suspension 30 mL, 30 mL, Oral, Q4H PRN, Akula, Vijaya, MD, 30 mL at 02/23/24 2209   aspirin  EC tablet 81 mg, 81 mg, Oral, Daily, Georgina Basket, MD, 81 mg at 02/25/24 1022   atorvastatin  (LIPITOR ) tablet 80 mg, 80 mg, Oral, Daily, Georgina Basket, MD, 80 mg at 02/25/24 1023   busPIRone  (BUSPAR ) tablet 10 mg, 10 mg,  Oral, TID, Akula, Vijaya, MD, 10 mg at 02/25/24 1500   calcium  carbonate (TUMS - dosed in mg elemental calcium ) chewable tablet 200 mg of elemental calcium , 1 tablet, Oral, TID WC, Akula, Vijaya, MD, 200 mg of elemental calcium  at 02/25/24 1256   ceFEPIme  (MAXIPIME ) 2 g in sodium chloride  0.9 % 100 mL IVPB, 2 g, Intravenous, Q12H, Uhlorn, Garett M, RPH, Last Rate: 200 mL/hr at 02/25/24 1058, 2 g at 02/25/24 1058   cholecalciferol  (VITAMIN D3) 25 MCG (1000 UNIT) tablet 2,000 Units, 2,000 Units, Oral, Daily, Akula, Vijaya, MD, 2,000 Units at 02/25/24 1023   diazepam  (VALIUM ) injection 10 mg, 10 mg, Intravenous, Q8H PRN, Howerter, Justin B, DO, 10 mg at 02/24/24 2154   doxepin  (SINEQUAN ) capsule 50 mg, 50 mg, Oral, QHS, Akula, Vijaya, MD   enoxaparin  (LOVENOX ) injection 50 mg, 50 mg, Subcutaneous, Q24H, Lamb, Bethany M, RPH, 50 mg at 02/23/24 1622   escitalopram  (LEXAPRO ) tablet 5 mg, 5 mg, Oral, Daily, Georgina Basket, MD, 5 mg at 02/25/24 1023   feeding supplement (ENSURE PLUS HIGH PROTEIN) liquid 237 mL, 237 mL, Oral, BID BM, Akula, Vijaya, MD, 237 mL at 02/25/24 1516   fluticasone  (FLONASE ) 50 MCG/ACT nasal spray 1 spray, 1 spray, Each Nare, Daily, Akula, Vijaya, MD, 1 spray at 02/25/24 1106   haloperidol  lactate (HALDOL ) injection 2 mg, 2 mg, Intravenous, Q6H PRN, Akula, Vijaya, MD, 2 mg at 02/24/24 2010   HYDROmorphone  (DILAUDID ) injection 0.5 mg, 0.5 mg, Intravenous, Q4H PRN, Akula, Vijaya, MD, 0.5 mg at 02/25/24  0102   hydrOXYzine  (ATARAX ) tablet 25 mg, 25 mg, Oral, Q6H PRN, Akula, Vijaya, MD, 25 mg at 02/24/24 1506   insulin  aspart (novoLOG ) injection 0-9 Units, 0-9 Units, Subcutaneous, TID WC, Akula, Vijaya, MD, 2 Units at 02/25/24 1256   lactulose  (CHRONULAC ) 10 GM/15ML solution 20 g, 20 g, Oral, BID, Georgina Basket, MD, 20 g at 02/24/24 1106   levETIRAcetam  (KEPPRA ) tablet 500 mg, 500 mg, Oral, BID, Georgina Basket, MD, 500 mg at 02/25/24 1021   lidocaine  (LIDODERM ) 5 % 1 patch, 1 patch,  Transdermal, Q24H, Akula, Vijaya, MD, 1 patch at 02/24/24 2029   linezolid  (ZYVOX ) tablet 600 mg, 600 mg, Oral, Q12H, Akula, Vijaya, MD, 600 mg at 02/25/24 1022   magnesium  oxide (MAG-OX) tablet 400 mg, 400 mg, Oral, TID, Akula, Vijaya, MD, 400 mg at 02/25/24 1500   melatonin tablet 10 mg, 10 mg, Oral, QHS, Akula, Vijaya, MD, 10 mg at 02/23/24 2135   methocarbamol  (ROBAXIN ) injection 500 mg, 500 mg, Intravenous, Q6H PRN, Howerter, Justin B, DO, 500 mg at 02/25/24 1016   multivitamin with minerals tablet 1 tablet, 1 tablet, Oral, Daily, Akula, Vijaya, MD, 1 tablet at 02/25/24 1022   naloxone  (NARCAN ) injection 0.4 mg, 0.4 mg, Intramuscular, PRN, Akula, Vijaya, MD   ondansetron  (ZOFRAN ) injection 4 mg, 4 mg, Intravenous, Q6H PRN, Akula, Vijaya, MD, 4 mg at 02/23/24 1324   OXcarbazepine  (TRILEPTAL ) tablet 150 mg, 150 mg, Oral, BID, Georgina Basket, MD, 150 mg at 02/25/24 1021   oxyCODONE  (Oxy IR/ROXICODONE ) immediate release tablet 10 mg, 10 mg, Oral, Q4H PRN, Georgina Basket, MD, 10 mg at 02/23/24 1622   oxyCODONE  (OXYCONTIN ) 12 hr tablet 15 mg, 15 mg, Oral, Q8H, Akula, Vijaya, MD, 15 mg at 02/25/24 1500   oxymetazoline  (AFRIN) 0.05 % nasal spray 1 spray, 1 spray, Each Nare, BID, Akula, Vijaya, MD, 1 spray at 02/25/24 1030   pregabalin  (LYRICA ) capsule 200 mg, 200 mg, Oral, BID, Akula, Vijaya, MD, 200 mg at 02/25/24 1021   QUEtiapine  (SEROQUEL ) tablet 25 mg, 25 mg, Oral, QHS, Akula, Vijaya, MD   senna (SENOKOT) tablet 17.2 mg, 2 tablet, Oral, QHS, Akula, Vijaya, MD, 17.2 mg at 02/23/24 2135   sodium bicarbonate  tablet 650 mg, 650 mg, Oral, BID, Akula, Vijaya, MD, 650 mg at 02/25/24 1021   sodium chloride  (OCEAN) 0.65 % nasal spray 1 spray, 1 spray, Each Nare, PRN, Akula, Vijaya, MD, 1 spray at 02/25/24 1030   tamsulosin  (FLOMAX ) capsule 0.4 mg, 0.4 mg, Oral, Daily, Georgina Basket, MD, 0.4 mg at 02/25/24 1022  Allergies: Allergies  Allergen Reactions   Latex Other (See Comments)    Allergic, per [facility]     Majel GORMAN Ramp, FNP, PGY-2

## 2024-02-25 NOTE — Plan of Care (Signed)
  Problem: Safety: Goal: Non-violent Restraint(s) Outcome: Progressing   Problem: Education: Goal: Knowledge of General Education information will improve Description: Including pain rating scale, medication(s)/side effects and non-pharmacologic comfort measures Outcome: Progressing   Problem: Health Behavior/Discharge Planning: Goal: Ability to manage health-related needs will improve Outcome: Progressing   Problem: Clinical Measurements: Goal: Ability to maintain clinical measurements within normal limits will improve Outcome: Progressing Goal: Will remain free from infection Outcome: Progressing Goal: Diagnostic test results will improve Outcome: Progressing Goal: Respiratory complications will improve Outcome: Progressing Goal: Cardiovascular complication will be avoided Outcome: Progressing   Problem: Activity: Goal: Risk for activity intolerance will decrease Outcome: Progressing   Problem: Nutrition: Goal: Adequate nutrition will be maintained Outcome: Progressing   Problem: Coping: Goal: Level of anxiety will decrease Outcome: Progressing   Problem: Elimination: Goal: Will not experience complications related to bowel motility Outcome: Progressing Goal: Will not experience complications related to urinary retention Outcome: Progressing   Problem: Pain Managment: Goal: General experience of comfort will improve and/or be controlled Outcome: Progressing   Problem: Safety: Goal: Ability to remain free from injury will improve Outcome: Progressing   Problem: Skin Integrity: Goal: Risk for impaired skin integrity will decrease Outcome: Progressing   Problem: Education: Goal: Ability to describe self-care measures that may prevent or decrease complications (Diabetes Survival Skills Education) will improve Outcome: Progressing Goal: Individualized Educational Video(s) Outcome: Progressing   Problem: Coping: Goal: Ability to adjust to condition or change  in health will improve Outcome: Progressing   Problem: Fluid Volume: Goal: Ability to maintain a balanced intake and output will improve Outcome: Progressing   Problem: Health Behavior/Discharge Planning: Goal: Ability to identify and utilize available resources and services will improve Outcome: Progressing Goal: Ability to manage health-related needs will improve Outcome: Progressing   Problem: Metabolic: Goal: Ability to maintain appropriate glucose levels will improve Outcome: Progressing   Problem: Nutritional: Goal: Maintenance of adequate nutrition will improve Outcome: Progressing Goal: Progress toward achieving an optimal weight will improve Outcome: Progressing   Problem: Skin Integrity: Goal: Risk for impaired skin integrity will decrease Outcome: Progressing   Problem: Tissue Perfusion: Goal: Adequacy of tissue perfusion will improve Outcome: Progressing

## 2024-02-25 NOTE — Plan of Care (Signed)
  Problem: Education: Goal: Knowledge of General Education information will improve Description: Including pain rating scale, medication(s)/side effects and non-pharmacologic comfort measures Outcome: Progressing   Problem: Health Behavior/Discharge Planning: Goal: Ability to manage health-related needs will improve Outcome: Progressing   Problem: Education: Goal: Knowledge of General Education information will improve Description: Including pain rating scale, medication(s)/side effects and non-pharmacologic comfort measures Outcome: Progressing   Problem: Clinical Measurements: Goal: Will remain free from infection Outcome: Progressing   Problem: Coping: Goal: Level of anxiety will decrease Outcome: Progressing

## 2024-02-25 NOTE — TOC Initial Note (Signed)
 Transition of Care [facility]) - Initial/Assessment Note    Patient Details  Name: Gregory [name] MRN: [id number] Date of Birth: February 17, 1961  Transition of Care [facility]) CM/SW Contact:    Isaiah Public, LCSWA Phone Number: 02/25/2024, 2:01 PM  Clinical Narrative:                  CSW received consult for possible SNF placement at time of discharge. Due to patients current orientation CSW spoke with patients daughter [name] regarding PT recommendation of SNF placement at time of discharge. [name] reports PTA patient comes from [facility] [employer]. Patients daughter confirms plan is for patient to return back to [facility] when patient medically ready for dc.Patients daughter expressed understanding of PT recommendation and is agreeable for patient to receive some short term rehab when patient returns back to [facility].CSW discussed insurance authorization process. Tammy with [facility] confirmed patient can return back to [facility] when medically ready for dc.No further questions reported at this time. CSW to continue to follow and assist with discharge planning needs.   Expected Discharge Plan: Skilled Nursing Facility Barriers to Discharge: Continued Medical Work up   Patient Goals and CMS Choice     Choice offered to / list presented to : Adult Children (daughter [name])      Expected Discharge Plan and Services In-house Referral: Clinical Social Work     Living arrangements for the past 2 months: Skilled Nursing Facility                                      Prior Living Arrangements/Services Living arrangements for the past 2 months: Skilled Nursing Facility Lives with:: Facility Resident Patient language and need for interpreter reviewed:: Yes        Need for Family Participation in Patient Care: Yes (Comment) Care giver support system in place?: Yes (comment)   Criminal Activity/Legal Involvement Pertinent to Current Situation/Hospitalization: No - Comment as  needed  Activities of Daily Living   ADL Screening (condition at time of admission) Independently performs ADLs?: No Does the patient have a NEW difficulty with bathing/dressing/toileting/self-feeding that is expected to last >3 days?: No Does the patient have a NEW difficulty with getting in/out of bed, walking, or climbing stairs that is expected to last >3 days?: No Does the patient have a NEW difficulty with communication that is expected to last >3 days?: No Is the patient deaf or have difficulty hearing?: Yes Does the patient have difficulty seeing, even when wearing glasses/contacts?: No Does the patient have difficulty concentrating, remembering, or making decisions?: Yes  Permission Sought/Granted Permission sought to share information with : Case Manager, Family Supports, Magazine features editor                Emotional Assessment         Alcohol  / Substance Use: Not Applicable Psych Involvement: No (comment)  Admission diagnosis:  Sepsis (HCC) [A41.9] Cellulitis, unspecified cellulitis site [L03.90] Sepsis, due to unspecified organism, unspecified whether acute organ dysfunction present [name]) [A41.9] Patient Active Problem List   Diagnosis Date Noted   Acute hypoxemic respiratory failure (HCC) 12/24/2023   SIRS (systemic inflammatory response syndrome) (HCC) 09/26/2023   Chronic kidney disease, stage 3a (HCC) 09/26/2023   History of CVA (cerebrovascular accident) 09/26/2023   Acute metabolic encephalopathy 09/02/2023   Agitation 08/03/2023   Persistent hyperactive delirium due to multiple etiologies 08/02/2023  Malnutrition of moderate degree 07/26/2023   Acute encephalopathy 07/16/2023   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) 07/03/2023   Fever 06/28/2023   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   Altered mental status 02/08/2023   Abscess of abdominal cavity (HCC) 02/08/2023   Protein-calorie malnutrition,  severe 12/19/2022   Occipital stroke (HCC) 11/27/2022   Skin ulcer of left ankle, limited to breakdown of skin (HCC) 09/19/2022   Stroke-like symptom 09/18/2022   Diabetic ulcer of lower leg (HCC) 09/18/2022   Constipation 09/18/2022   Shortness of breath 09/07/2022   Anemia of chronic disease 09/05/2022   Chronic hypoxic respiratory failure (HCC) 09/05/2022   Respiratory failure (HCC) 08/22/2022   Pressure injury of skin 08/22/2022   Influenza A 08/22/2022   Sepsis (HCC) 08/22/2022   Pneumonia 05/01/2022   Right upper lobe pneumonia 09/05/2021   Hyponatremia 09/05/2021   Anxiety 09/05/2021   BPH (benign prostatic hyperplasia) 09/05/2021   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   Asymptomatic bacteriuria 06/30/2021   Normocytic anemia 06/29/2021   Pain of amputation stump of right lower extremity (HCC) 03/15/2021   Melanoma of cheek (HCC) 10/15/2019   Acute hypoxic respiratory failure (HCC) 07/11/2019   Acute respiratory disease due to COVID-19 virus 07/11/2019   Wound infection 06/22/2019   Abscess 06/22/2019   Chronic kidney disease, stage 3b (HCC) 05/23/2019   Tobacco abuse 05/23/2019   Cellulitis 05/23/2019   S/P AKA (above knee amputation) unilateral, right (HCC) 12/05/2018   Amputation stump infection (HCC)    Lymphedema of left leg    Diabetic polyneuropathy associated with type 2 diabetes mellitus (HCC)    Above knee amputation of right lower extremity (HCC) 10/22/2018   Dehiscence of amputation stump (HCC)    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   Candidiasis 08/12/2018   Necrosis of toe (HCC) 07/08/2018   Amputated toe of left foot (HCC) 06/18/2018   Streptococcal infection    Pressure injury, stage 3 (HCC) 06/06/2018   Cutaneous abscess of left foot    Moderate protein-calorie malnutrition (HCC)    Cellulitis of left lower extremity    Septic arthritis (HCC) 06/03/2018   Idiopathic chronic venous hypertension of left lower extremity with  ulcer and inflammation (HCC) 11/14/2017   Great toe amputation status, left 11/14/2017   Enlarged lymph node in neck 07/18/2017   Malignant melanoma of face (HCC) 04/16/2017   Osteomyelitis (HCC) 03/25/2017   Skin cancer 03/25/2017   Mood disorder (HCC) 11/05/2016   Diabetic ulcer of toe of left foot (HCC) 10/02/2016   Epidural abscess 10/02/2016   Chronic pain 09/27/2016   DM2 (diabetes mellitus, type 2) (HCC) 06/06/2016   GERD without esophagitis 05/11/2016   Hypertensive heart disease with congestive heart failure (HCC) 12/15/2015   Spinal stenosis in cervical region 12/15/2015   Type II diabetes mellitus with neurological manifestations (HCC) 12/15/2015   Chronic diastolic HF (heart failure) (HCC)    Pulmonary hypertension (HCC)    HTN (hypertension)    HLD (hyperlipidemia)    Urinary retention    Diskitis    Foot drop, bilateral    Sepsis with acute renal failure without septic shock (HCC)    COPD (chronic obstructive pulmonary disease) (HCC) 10/03/2015   Acute osteomyelitis of left foot (HCC) 10/03/2015   Streptococcal bacteremia 08/13/2015   Chronic low back pain 08/13/2015   Chronic multifocal osteomyelitis, multiple sites (HCC) 08/13/2015   Community acquired pneumonia 08/13/2015   Encephalopathy 03/25/2012   PCP:  [name], Provider, MD Pharmacy:   [facility] - [place], [place] - [address] [place] [address] Phone: [phone] Fax: [phone]     Social Drivers of Health (SDOH) Social History: SDOH Screenings   Food Insecurity: No Food Insecurity (02/23/2024)  Housing: Low Risk  (02/23/2024)  Transportation Needs: No Transportation Needs (02/23/2024)  Utilities: Not At Risk (02/23/2024)  Depression (PHQ2-9): Low Risk  (03/08/2021)  Social Connections: Socially Isolated (09/26/2023)  Tobacco Use: High Risk (12/24/2023)   SDOH Interventions:     Readmission Risk Interventions    09/04/2023    1:49 PM 09/06/2022     1:48 PM 08/27/2022   12:46 PM  Readmission Risk Prevention Plan  Transportation Screening Complete Complete Complete  Medication Review Oceanographer) Complete Complete Complete  PCP or Specialist appointment within 3-5 days of discharge Complete Complete Complete  HRI or Home Care Consult Complete Complete Complete  SW Recovery Care/Counseling Consult Complete Complete Complete  Palliative Care Screening Not Applicable Not Applicable Not Applicable  Skilled Nursing Facility Complete Complete Complete

## 2024-02-25 NOTE — Progress Notes (Signed)
 Patient was transferred with his belongings to 2W. No issue or concern noted during the transfer. Ostomy bag and condom cath bag emptied prior.

## 2024-02-25 NOTE — Progress Notes (Signed)
 Pharmacy Antibiotic Note  Gregory [name] is a 63 y.o. male  with possible osteomyelitis. Pharmacy has been consulted for zosyn  dosing.  -SCr 1.85, CrCl 45  Plan: -Zosyn  3.375gm IV q8h -Will follow renal function, cultures and clinical progress   Weight: 111.1 kg (245 lb)  Temp (24hrs), Avg:98.4 F (36.9 C), Min:98 F (36.7 C), Max:99 F (37.2 C)  Recent Labs  Lab 02/21/24 0934 02/21/24 0941 02/21/24 1140 02/21/24 1740 02/22/24 0508 02/24/24 0940 02/24/24 0943  WBC 16.6*  --   --  18.4* 11.6* 5.4  --   CREATININE 2.13*  --   --  1.99* 1.99* 1.85*  --   LATICACIDVEN  --  2.0* 2.0*  --   --   --  0.5    Estimated Creatinine Clearance: 46.4 mL/min (A) (by C-G formula based on SCr of 1.85 mg/dL (H)).    Allergies  Allergen Reactions   Latex Other (See Comments)    Allergic, per MAR    Antimicrobials this admission: Linezolid  9/7 >> Vanc 9/5 - 9/6  Cefepime  9/5>>  Dose adjustments this admission:   Microbiology results: Bcx 9/5: ngtd   Thank you for allowing pharmacy to be a part of this patient's care.  Prentice Poisson, PharmD Clinical Pharmacist **Pharmacist phone directory can now be found on [email or web address] ([email or web address]).  Listed under [facility].

## 2024-02-26 ENCOUNTER — Encounter (HOSPITAL_COMMUNITY): Payer: Self-pay | Admitting: Hospitalist

## 2024-02-26 ENCOUNTER — Inpatient Hospital Stay (HOSPITAL_COMMUNITY)

## 2024-02-26 DIAGNOSIS — Z8659 Personal history of other mental and behavioral disorders: Secondary | ICD-10-CM

## 2024-02-26 DIAGNOSIS — F411 Generalized anxiety disorder: Secondary | ICD-10-CM

## 2024-02-26 DIAGNOSIS — L03116 Cellulitis of left lower limb: Secondary | ICD-10-CM

## 2024-02-26 DIAGNOSIS — G9340 Encephalopathy, unspecified: Secondary | ICD-10-CM

## 2024-02-26 DIAGNOSIS — F05 Delirium due to known physiological condition: Secondary | ICD-10-CM

## 2024-02-26 DIAGNOSIS — R0902 Hypoxemia: Secondary | ICD-10-CM

## 2024-02-26 DIAGNOSIS — A419 Sepsis, unspecified organism: Secondary | ICD-10-CM | POA: Diagnosis not present

## 2024-02-26 DIAGNOSIS — R410 Disorientation, unspecified: Secondary | ICD-10-CM

## 2024-02-26 DIAGNOSIS — F329 Major depressive disorder, single episode, unspecified: Secondary | ICD-10-CM

## 2024-02-26 DIAGNOSIS — E119 Type 2 diabetes mellitus without complications: Secondary | ICD-10-CM

## 2024-02-26 LAB — CULTURE, BLOOD (ROUTINE X 2)
Culture: NO GROWTH
Culture: NO GROWTH
Special Requests: ADEQUATE
Special Requests: ADEQUATE

## 2024-02-26 LAB — RESPIRATORY PANEL BY PCR

## 2024-02-26 LAB — CBC WITH DIFFERENTIAL/PLATELET
Abs Immature Granulocytes: 0.08 K/uL — ABNORMAL HIGH (ref 0.00–0.07)
Basophils Absolute: 0 K/uL (ref 0.0–0.1)
Basophils Relative: 0 %
Eosinophils Absolute: 0.2 K/uL (ref 0.0–0.5)
Eosinophils Relative: 2 %
HCT: 32.2 % — ABNORMAL LOW (ref 39.0–52.0)
Hemoglobin: 10.1 g/dL — ABNORMAL LOW (ref 13.0–17.0)
Immature Granulocytes: 1 %
Lymphocytes Relative: 2 %
Lymphs Abs: 0.2 K/uL — ABNORMAL LOW (ref 0.7–4.0)
MCH: 28.5 pg (ref 26.0–34.0)
MCHC: 31.4 g/dL (ref 30.0–36.0)
MCV: 91 fL (ref 80.0–100.0)
Monocytes Absolute: 0.5 K/uL (ref 0.1–1.0)
Monocytes Relative: 4 %
Neutro Abs: 10.2 K/uL — ABNORMAL HIGH (ref 1.7–7.7)
Neutrophils Relative %: 91 %
Platelets: 160 K/uL (ref 150–400)
RBC: 3.54 MIL/uL — ABNORMAL LOW (ref 4.22–5.81)
RDW: 14.8 % (ref 11.5–15.5)
WBC: 11.2 K/uL — ABNORMAL HIGH (ref 4.0–10.5)
nRBC: 0 % (ref 0.0–0.2)

## 2024-02-26 LAB — BASIC METABOLIC PANEL WITH GFR
Anion gap: 10 (ref 5–15)
BUN: 32 mg/dL — ABNORMAL HIGH (ref 8–23)
CO2: 18 mmol/L — ABNORMAL LOW (ref 22–32)
Calcium: 8.4 mg/dL — ABNORMAL LOW (ref 8.9–10.3)
Chloride: 112 mmol/L — ABNORMAL HIGH (ref 98–111)
Creatinine, Ser: 2 mg/dL — ABNORMAL HIGH (ref 0.61–1.24)
GFR, Estimated: 37 mL/min — ABNORMAL LOW (ref >60)
Glucose, Bld: 132 mg/dL — ABNORMAL HIGH (ref 70–99)
Potassium: 4.1 mmol/L (ref 3.5–5.1)
Sodium: 140 mmol/L (ref 135–145)

## 2024-02-26 LAB — BLOOD GAS, ARTERIAL
Acid-base deficit: 3.7 mmol/L — ABNORMAL HIGH (ref 0.0–2.0)
Bicarbonate: 22.1 mmol/L (ref 20.0–28.0)
Drawn by: 535471
O2 Saturation: 67.4 %
Patient temperature: 36.6
pCO2 arterial: 41 mmHg (ref 32–48)
pH, Arterial: 7.34 — ABNORMAL LOW (ref 7.35–7.45)
pO2, Arterial: 37 mmHg — CL (ref 83–108)

## 2024-02-26 LAB — GLUCOSE, CAPILLARY
Glucose-Capillary: 122 mg/dL — ABNORMAL HIGH (ref 70–99)
Glucose-Capillary: 123 mg/dL — ABNORMAL HIGH (ref 70–99)
Glucose-Capillary: 100 mg/dL — ABNORMAL HIGH (ref 70–99)
Glucose-Capillary: 185 mg/dL — ABNORMAL HIGH (ref 70–99)

## 2024-02-26 MED ORDER — OXYCODONE HCL 5 MG PO TABS
5.0000 mg | ORAL_TABLET | ORAL | Status: DC | PRN
  Administered 2024-02-26 – 2024-03-01 (×11): 5 mg via ORAL
  Filled 2024-02-26 (×11): qty 1

## 2024-02-26 MED ORDER — LEVETIRACETAM (KEPPRA) 500 MG/5 ML ADULT IV PUSH
500.0000 mg | Freq: Two times a day (BID) | INTRAVENOUS | Status: DC
  Administered 2024-02-26 – 2024-02-28 (×5): 500 mg via INTRAVENOUS
  Filled 2024-02-26 (×5): qty 5

## 2024-02-26 NOTE — Progress Notes (Signed)
 RN went in this morning to round on patient he was very hard to arouse, RN attempted to sternal rub patient he still barely made any sounds or movements, called his name he mumbled a little but nothing. NT walked in the room collected his VS. RN called Ship broker into the room for assistance and also messaged MD to notify him [name] o2 was 84/85 on RA we placed pt on oxygen pt was requiring 5L o2 to maintain sats of 94/95. MD made aware MD also stated to keep pt NPO at the moment and that he would get ABGs and come and see pt.

## 2024-02-26 NOTE — Congregational Nurse Program (Signed)
 Pt refused both tele and MRI last night, MD aware (Dr. Marcene)

## 2024-02-26 NOTE — Congregational Nurse Program (Addendum)
 Pt oriented 4x, VSS, restraints weren't applied when pt arrived on the floor, it was D/C per nurse, Joyce Atambile. 150 ml of stool emptied from ileostomy. Pt refused Tele, skin assessment, and wound care.

## 2024-02-26 NOTE — Consult Note (Cosign Needed Addendum)
 [facility] Psychiatric Consult Initial  Patient Name: .Gregory [name]  MRN: [id number]  DOB: 12/03/1960  Consult Order details: Severe agitation.   Mode of Visit: In person   Psychiatry Consult Evaluation  Service Date: February 26, 2024 LOS:  LOS: 5 days  Chief Complaint I'm fine. I will tell you why they wanted you to come see me.  Primary Psychiatric Diagnoses  Hyperactive delirium  2.   History of MDD   Assessment  Gregory [name] is a 63 y.o. male admitted: Medicallyfor 02/21/2024  8:49 AM for acute agitated encephalopathy. 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   The patient's current presentation of inattention, waxing and waning mental status, and changes over the past several days is most consistent with hyperactive delirium, likely related to a combination of prior infection, multiple medications (notably cefepime ), and pain. His primary psychiatric diagnoses over the years have been depression and anxiety. Both the patient and collateral deny any history of psychiatric hospitalizations or suicide attempts.  02/26/2024:  On initial examination, the patient was alert, calm, and cooperative. He was appropriate throughout the evaluation, answered all questions, and requested to go home. He was not in any pain at the time of assessment. 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 The patient remains out of restraints and continues to show fluctuating orientation and attention, consistent with prior observations.He denies si/hi/avh. Patient required no prns over night.   Diagnoses:  Active Hospital problems: Principal Problem:    Sepsis (HCC) Active Problems:   Acute hyperactive delirium due to another medical condition    Plan  ## Psychiatric Medication Recommendations:  --Continue current medications.  -Will continue seroquel  25mg  po at bedtime and repeat x 1 prn.   ## Medical Decision Making Capacity: Not specifically addressed in this encounter  ## Further Work-up:  - Recommend discontinuation of cefepime  as it maybe contributing to his condition.   -- most recent EKG on 9/8 had Qtc of 417 -- Pertinent labwork reviewed earlier this admission includes: CMP, CBC (9.3 hgb), Sed rate (69),A1c (5.9) TSH, UDS. Slight change in labs and vitals, this was communicated to the primary team and will order CXR and resp panel .  ## Disposition:-- There are no psychiatric contraindications to discharge at this time  ## Behavioral / Environmental: -Delirium Precautions: Delirium Interventions for Nursing and Staff: - RN to open blinds every AM. - To Bedside: Glasses, hearing aide, and pt's own shoes. Make available to patients. when possible and encourage use. - Encourage po fluids when appropriate, keep fluids within reach. - OOB to chair with meals. - Passive ROM exercises to all extremities with AM & PM care. - RN to assess orientation to person, time and place QAM and PRN. - Recommend extended visitation hours with familiar family/friends as feasible. - Staff to minimize disturbances at night. Turn off television when pt asleep or when not in use.   ## Safety and Observation Level:  - Based on my clinical evaluation, I estimate the patient to be at moderate risk of self harm in the current setting due to agitation. - At this time, we  recommend safety observation. This decision is based on my review of the chart including patient's history and current presentation, interview of the patient, mental status examination, and consideration of suicide risk including evaluating suicidal ideation, plan, intent, suicidal or self-harm  behaviors, risk factors, and protective factors. This judgment is based on our ability to directly address suicide risk, implement suicide prevention strategies, and develop a safety plan while the patient is in the clinical setting. Please contact our team if there is a concern that risk level has changed.  CSSR Risk Category:C-SSRS RISK CATEGORY: No Risk  Suicide Risk Assessment: Patient has following modifiable risk factors for suicide: recklessness, which we are addressing by optimizing his medications Patient has following non-modifiable or demographic risk factors for suicide: male gender Patient has the following protective factors against suicide: Supportive family, no history of suicide attempts, and no history of NSSIB  Thank you for this consult request. Recommendations have been communicated to the primary team. Suspect primary delirium has resolved. We will sign off at this time.   Gregory GORMAN Ramp, FNP,                                                             History of Present Illness  Relevant Aspects of [facility] Course:  Gregory [name] is a 63 year old male with multiple comorbidities including diabetes on insulin , COPD, right AKA, chronic pain on narcotics, and recent admission for GBS bacteremia/encephalopathy. He presented with confusion and was found septic from LLE cellulitis.  In the ED, he was febrile to 104F, hypertensive, tachycardic, tachypneic, with Cr 2.13, WBC 16.6, and lactate 2. IV antibiotics were started, and he was admitted for sepsis. Foot x-rays showed possible osteomyelitis of the third metatarsal head and erosions of the fourth/fifth distal phalanges. Orthopedics was consulted (Dr. Harden), and ABIs were ordered.  Since admission, he has had episodes of agitation requiring Geodon  and restraints, though today he is alert, oriented, and answering questions appropriately.  Patient Report:  The patient was seen in bed, awake, alert, and very  jovial. The patient actively participated in the conversation, providing additional information and demonstrating clear insight. He was appropriate throughout the evaluation, answering all questions and requesting to go home. He followed instructions directly without prompts and was able to call for assistance as needed. The patient denied suicidal ideation, homicidal ideation, or auditory/visual hallucinations.    Psych ROS:  Depression: reports always had depression Anxiety: reports anxiety  Mania (lifetime and current): denies  Psychosis: (lifetime and current): none  Collateral information:  Contacted daughter [name] 02/25/2024 She reports the patient is back to baseline and is surprised at how well he is doing. She is hesitant to give additional medications tonight and prefers to observe. Medications will be changed to PRN per daughter's request. We discussed the multifactorial causes of delirium and his current clinical presentation. She confirms he is from a facility and would like him to return there when stable.   Review of Systems  Constitutional:  Positive for malaise/fatigue. Diaphoresis: glossy eyes. HENT:  Positive for congestion.        Runny nose and sniffles  Psychiatric/Behavioral: Negative.    All other systems reviewed and are negative.   Reports pain in his leg  Psychiatric and Social History  Psychiatric History:  Information collected from patient, chart review  Prev Dx/Sx: MDD Current Psych Provider: none Home Meds (current): scheduled lexapro  20, oxy 15 TID, lyrica  25 TID, 100 BID, seroquel  100 at bedtime, PRN haldol  5 Q6H, oxy 5 Q6H Previous Med Trials: wellbutrin , cymbalta , gabapentin  Therapy: unknown  Prior Psych Hospitalization: denies  Prior Self Harm: denies Prior Violence: denies  Family Psych History: denies, reports sister struggles with substance use  Family Hx suicide: unknown  Social History:  Living Situation: Lives [facility] for  several years, no access to anything else  Access to weapons/lethal means: none   Substance History Denies substance use history, UDS+opiates, BZD. Patient required ativan , versed .  -In the past he used marijuana frequently, but then became dependent on pain meds   Exam Findings  Vital Signs:  Temp:  [97.8 F (36.6 C)-100.4 F (38 C)] 100.4 F (38 C) (09/10 1147) Pulse Rate:  [50-94] 94 (09/10 1147) Resp:  [19-20] 19 (09/10 0412) BP: (100-161)/(56-73) 111/61 (09/10 1147) SpO2:  [80 %-100 %] 100 % (09/10 1147) Weight:  [82.6 kg] 82.6 kg (09/09 2135) Blood pressure 111/61, pulse 94, temperature (!) 100.4 F (38 C), resp. rate 19, height 6' (1.829 m), weight 82.6 kg, SpO2 100%. Body mass index is 24.7 kg/m.  Physical Exam Constitutional:      Appearance: Normal appearance. He is obese.  HENT:     Head: Normocephalic.  Pulmonary:     Effort: Pulmonary effort is normal.  Neurological:     General: No focal deficit present.     Mental Status: He is alert and oriented to person, place, and time. Mental status is at baseline.  Psychiatric:        Mood and Affect: Mood normal.        Behavior: Behavior normal.        Thought Content: Thought content normal.        Judgment: Judgment normal.    Mental Status Exam:  Orientation:  A&O x 3  Memory:  Fairly nomal  Concentration:  Concentration: Fair  Recall:  Fair  Attention  Fair  Eye Contact:  Fair  Speech:   normal   Language:   normal    Volume:   normal   Mood:  normal  Affect:  Appropriate and Congruent  Thought Process:  linear  Thought Content:  Logical  Suicidal Thoughts:  Denies  Homicidal Thoughts:  Denies   Judgement:  improving  Insight:  fair  Psychomotor Activity:  Normal  Akathisia:  No  Fund of Knowledge:    Denied perceptual disturbances Assets: moderate ability to communicate needs  Cognition:  Improving, mild  ADL's:  Needs assistance  AIMS (if indicated):        Other History   These have  been pulled in through the EMR, reviewed, and updated if appropriate.  Family History:  The patient's family history includes Bone cancer in his father; Cancer in his father, mother, paternal grandfather, and another family member; Diabetes in his mother and another family member; Heart disease in his mother; Prostate cancer in his father.  Medical History: 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)    Acquired absence of right leg below knee (HCC)    Acute osteomyelitis of calcaneum, right (HCC) 10/02/2016   Acute respiratory failure (HCC)    Amputation stump infection (HCC) 08/12/2018   Anemia    Basal cell carcinoma, eyelid    Cancer (HCC)  Candidiasis 08/12/2018   CHF (congestive heart failure) (HCC)    chronic diastolic    Chronic back pain    Chronic kidney disease    stage 3    Chronic multifocal osteomyelitis (HCC)    of ankle and foot    Chronic pain syndrome    COPD (chronic obstructive pulmonary disease) (HCC)    DDD (degenerative disc disease), lumbar    Depression    major depressive disorder    Diabetes mellitus without complication (HCC)    type 2    Diabetic ulcer of toe of left foot (HCC) 10/02/2016   Difficult intubation    Difficulty in walking, not elsewhere classified    Epidural abscess 10/02/2016   Foot drop, bilateral    Foot drop, right 12/27/2015   GERD (gastroesophageal reflux disease)    Herpesviral vesicular dermatitis    History of COVID-19 03/15/2022   Hyperlipidemia    Hyperlipidemia    Hypertension    Insomnia    Malignant melanoma of other parts of face (HCC)    Melanoma (HCC)    MRSA bacteremia    Muscle weakness (generalized)    Nasal congestion    Necrosis of toe (HCC) 07/08/2018   Neuromuscular dysfunction of bladder    Osteomyelitis (HCC)    Osteomyelitis (HCC)    Osteomyelitis (HCC)    right BKA   Other idiopathic peripheral autonomic neuropathy    Retention of urine, unspecified     Squamous cell carcinoma of skin    Stroke (HCC)    Unsteadiness on feet    Urinary retention    Urinary retention    Urinary tract infection    Wears dentures    Wears glasses     Surgical History: Past Surgical History:  Procedure Laterality Date   AMPUTATION Left 03/29/2017   Procedure: LEFT GREAT TOE AMPUTATION AT METATARSOPHALANGEAL JOINT;  Surgeon: Harden Jerona GAILS, MD;  Location: [facility] OR;  Service: Orthopedics;  Laterality: Left;   AMPUTATION Right 03/29/2017   Procedure: RIGHT BELOW KNEE AMPUTATION;  Surgeon: Harden Jerona GAILS, MD;  Location: [facility] OR;  Service: Orthopedics;  Laterality: Right;   AMPUTATION Left 06/06/2018   Procedure: LEFT 2ND TOE AMPUTATION;  Surgeon: Harden Jerona GAILS, MD;  Location: [facility] OR;  Service: Orthopedics;  Laterality: Left;   AMPUTATION Right 11/19/2018   Procedure: AMPUTATION ABOVE KNEE;  Surgeon: Harden Jerona GAILS, MD;  Location: [facility] OR;  Service: Orthopedics;  Laterality: Right;   ANTERIOR CERVICAL CORPECTOMY N/A 11/25/2015   Procedure: ANTERIOR CERVICAL FIVE CORPECTOMY Cervical four - six fusion;  Surgeon: Gerldine Maizes, MD;  Location: [facility];  Service: Neurosurgery;  Laterality: N/A;  ANTERIOR CERVICAL FIVE CORPECTOMY Cervical four - six fusion   APPLICATION OF WOUND VAC Right 10/22/2018   Procedure: Application Of Wound Vac;  Surgeon: Harden Jerona GAILS, MD;  Location: [facility] OR;  Service: Orthopedics;  Laterality: Right;   CYSTOSCOPY WITH BIOPSY N/A 05/01/2022   Procedure: CYSTOSCOPY WITH URETHRAL BIOPSY;  Surgeon: Elisabeth Valli BIRCH, MD;  Location: [facility] ORS;  Service: Urology;  Laterality: N/A;  1 HR   CYSTOSCOPY WITH RETROGRADE URETHROGRAM N/A 04/25/2018   Procedure: CYSTOSCOPY WITH RETROGRADE URETHROGRAM/ BALLOON DILATION;  Surgeon: Ottelin, Mark, MD;  Location: [facility] ORS;  Service: Urology;  Laterality: N/A;   CYSTOSCOPY WITH URETHRAL DILATATION N/A 05/01/2022   Procedure: BALLOON DILATION WITH  OPTILUME;  Surgeon: Elisabeth Valli BIRCH, MD;  Location: [facility] ORS;  Service:  Urology;  Laterality: N/A;   LAPAROTOMY N/A 12/03/2022  Procedure: EXPLORATORY LAPAROTOMY  total abdominal colectomy with end ileostomy;  Surgeon: Kinsinger, Herlene Righter, MD;  Location: [facility] OR;  Service: General;  Laterality: N/A;   MULTIPLE TOOTH EXTRACTIONS     POSTERIOR CERVICAL FUSION/FORAMINOTOMY N/A 11/29/2015   Procedure: Cervical Three-Cervical Seven Posterior Cervical Laminectomy with Fusion;  Surgeon: Gerldine Maizes, MD;  Location: [facility];  Service: Neurosurgery;  Laterality: N/A;  Cervical Three-Cervical Seven Posterior Cervical Laminectomy with Fusion   STUMP REVISION Right 10/22/2018   Procedure: REVISION RIGHT BELOW KNEE AMPUTATION;  Surgeon: Harden Jerona GAILS, MD;  Location: [facility] OR;  Service: Orthopedics;  Laterality: Right;   STUMP REVISION Right 12/05/2018   Procedure: Revision Right Above Knee Amputation;  Surgeon: Harden Jerona GAILS, MD;  Location: [facility] OR;  Service: Orthopedics;  Laterality: Right;   STUMP REVISION Right 05/29/2019   Procedure: REVISION RIGHT ABOVE KNEE AMPUTATION;  Surgeon: Harden Jerona GAILS, MD;  Location: [facility] OR;  Service: Orthopedics;  Laterality: Right;   STUMP REVISION Right 06/24/2019   Procedure: STUMP REVISION;  Surgeon: Harden Jerona GAILS, MD;  Location: [facility] OR;  Service: Orthopedics;  Laterality: Right;     Medications:   Current Facility-Administered Medications:    albuterol  (PROVENTIL ) (2.5 MG/3ML) 0.083% nebulizer solution 2.5 mg, 2.5 mg, Nebulization, Q6H PRN, Akula, Vijaya, MD   alum & mag hydroxide-simeth (MAALOX/MYLANTA) 200-200-20 MG/5ML suspension 30 mL, 30 mL, Oral, Q4H PRN, Akula, Vijaya, MD, 30 mL at 02/23/24 2209   aspirin  EC tablet 81 mg, 81 mg, Oral, Daily, Georgina Basket, MD, 81 mg at 02/25/24 1022   atorvastatin  (LIPITOR ) tablet 80 mg, 80 mg, Oral, Daily, Georgina Basket, MD, 80 mg at 02/25/24 1023   busPIRone  (BUSPAR ) tablet 10 mg, 10 mg, Oral, TID, Akula, Vijaya, MD, 10 mg at 02/25/24 2113   calcium  carbonate (TUMS - dosed in mg elemental  calcium ) chewable tablet 200 mg of elemental calcium , 1 tablet, Oral, TID WC, Akula, Vijaya, MD, 200 mg of elemental calcium  at 02/26/24 1200   cholecalciferol  (VITAMIN D3) 25 MCG (1000 UNIT) tablet 2,000 Units, 2,000 Units, Oral, Daily, Akula, Vijaya, MD, 2,000 Units at 02/25/24 1023   doxepin  (SINEQUAN ) capsule 50 mg, 50 mg, Oral, QHS, Akula, Vijaya, MD, 50 mg at 02/25/24 2337   enoxaparin  (LOVENOX ) injection 50 mg, 50 mg, Subcutaneous, Q24H, Lamb, Bethany M, RPH, 50 mg at 02/25/24 1800   escitalopram  (LEXAPRO ) tablet 5 mg, 5 mg, Oral, Daily, Georgina Basket, MD, 5 mg at 02/25/24 1023   feeding supplement (ENSURE PLUS HIGH PROTEIN) liquid 237 mL, 237 mL, Oral, BID BM, Akula, Vijaya, MD, 237 mL at 02/26/24 1330   fluticasone  (FLONASE ) 50 MCG/ACT nasal spray 1 spray, 1 spray, Each Nare, Daily, Akula, Vijaya, MD, 1 spray at 02/25/24 1106   haloperidol  lactate (HALDOL ) injection 2 mg, 2 mg, Intravenous, Q6H PRN, Akula, Vijaya, MD, 2 mg at 02/24/24 2010   HYDROmorphone  (DILAUDID ) injection 0.5 mg, 0.5 mg, Intravenous, Q4H PRN, Akula, Vijaya, MD, 0.5 mg at 02/25/24 2313   hydrOXYzine  (ATARAX ) tablet 25 mg, 25 mg, Oral, Q6H PRN, Akula, Vijaya, MD, 25 mg at 02/25/24 2313   insulin  aspart (novoLOG ) injection 0-9 Units, 0-9 Units, Subcutaneous, TID WC, Akula, Vijaya, MD, 1 Units at 02/26/24 1200   lactulose  (CHRONULAC ) 10 GM/15ML solution 20 g, 20 g, Oral, BID, Georgina Basket, MD, 20 g at 02/24/24 1106   levETIRAcetam  (KEPPRA ) undiluted injection 500 mg, 500 mg, Intravenous, Q12H, Sreeram, Narendranath, MD, 500 mg at 02/26/24 1151   lidocaine  (LIDODERM ) 5 % 1 patch,  1 patch, Transdermal, Q24H, Akula, Vijaya, MD, 1 patch at 02/24/24 2029   linezolid  (ZYVOX ) tablet 600 mg, 600 mg, Oral, Q12H, Akula, Vijaya, MD, 600 mg at 02/25/24 2113   magnesium  oxide (MAG-OX) tablet 400 mg, 400 mg, Oral, TID, Akula, Vijaya, MD, 400 mg at 02/25/24 2113   melatonin tablet 10 mg, 10 mg, Oral, QHS, Akula, Vijaya, MD, 10 mg at  02/25/24 2113   methocarbamol  (ROBAXIN ) injection 500 mg, 500 mg, Intravenous, Q6H PRN, Howerter, Justin B, DO, 500 mg at 02/25/24 2313   multivitamin with minerals tablet 1 tablet, 1 tablet, Oral, Daily, Akula, Vijaya, MD, 1 tablet at 02/25/24 1022   naloxone  (NARCAN ) injection 0.4 mg, 0.4 mg, Intramuscular, PRN, Akula, Vijaya, MD   ondansetron  (ZOFRAN ) injection 4 mg, 4 mg, Intravenous, Q6H PRN, Akula, Vijaya, MD, 4 mg at 02/25/24 1800   OXcarbazepine  (TRILEPTAL ) tablet 150 mg, 150 mg, Oral, BID, Georgina Basket, MD, 150 mg at 02/25/24 2313   oxyCODONE  (Oxy IR/ROXICODONE ) immediate release tablet 5 mg, 5 mg, Oral, Q4H PRN, Sreeram, Narendranath, MD, 5 mg at 02/26/24 1207   oxyCODONE  (OXYCONTIN ) 12 hr tablet 15 mg, 15 mg, Oral, Q8H, Akula, Vijaya, MD, 15 mg at 02/26/24 1343   piperacillin -tazobactam (ZOSYN ) IVPB 3.375 g, 3.375 g, Intravenous, Q8H, Gretel Prentice BIRCH, RPH, Last Rate: 12.5 mL/hr at 02/26/24 1212, 3.375 g at 02/26/24 1212   pregabalin  (LYRICA ) capsule 200 mg, 200 mg, Oral, BID, Akula, Vijaya, MD, 200 mg at 02/25/24 2113   QUEtiapine  (SEROQUEL ) tablet 25 mg, 25 mg, Oral, QHS,MR X 1, Starkes-Perry, Takia S, FNP, 25 mg at 02/25/24 2314   senna (SENOKOT) tablet 17.2 mg, 2 tablet, Oral, QHS, Akula, Vijaya, MD, 17.2 mg at 02/23/24 2135   sodium bicarbonate  tablet 650 mg, 650 mg, Oral, BID, Akula, Vijaya, MD, 650 mg at 02/25/24 2113   sodium chloride  (OCEAN) 0.65 % nasal spray 1 spray, 1 spray, Each Nare, PRN, Akula, Vijaya, MD, 1 spray at 02/25/24 1030   tamsulosin  (FLOMAX ) capsule 0.4 mg, 0.4 mg, Oral, Daily, Georgina Basket, MD, 0.4 mg at 02/25/24 1022  Allergies: Allergies  Allergen Reactions   Latex Other (See Comments)    Allergic, per [facility]    Gregory GORMAN Ramp, FNP,

## 2024-02-26 NOTE — Progress Notes (Signed)
 Progress Note   Patient: Gregory [name] FMW:[id number] DOB: 1960/07/07 DOA: 02/21/2024     5 DOS: the patient was seen and examined on 02/26/2024   Brief hospital course: Gregory [name] is a 63 y.o. male with medical history significant of hypertension, hyperlipidemia, type 2 diabetes on insulin , cervical spinal stenosis, high output ileostomy, COPD, right AKA, chronic pain syndrome on multiple narcotics, stroke, and recent admission in 12/2023 for GBS bacteremia and encephalopathy presented with confusion/ encephalopathy and found to have sepsis 2/2 LLE cellulitis.   In the ED, pt hypertensive, tachycardic, and tachypneic w/o hypoxia. Febrile to 104. Labs notable for Cr 2.13, WBC 16.6, and lactic acid 2. EDP started IV abx and requested medicine admission for sepsis.    He ws found to have  possible Osteomyelitis in the third metatarsal head versus nondisplaced fracture. Bony erosions along the tufts of the distal phalanx of the fourth and fifth digits, possible osteomyelitis on x rays of the foot. Refused to get MRI due to claustrophobia. Orthopedics consulted. He has been on and off agitated and lethargic with medications.  Assessment and Plan: Sepsis due to left lower extremity cellulitis- Possible osteomyelitis of 3rd metatarsal, 4th, 5th distal phalanx - Presented with fever, tachycardic, tachypneic, with leukocytosis and elevated lactic acid  Continue broad spectrum IV antibiotics Zyvox , Zosyn .  Sepsis physiology improving.  CRP is 11.8, sed rate around 69. Resp panel is negative. UA  unremarkable. 20 path respiratory viral panel ordered. Chest xray order placed as he is hypoxic today. Blood cultures negative so far.  Ortho consultation pending. He has claustrophobia and does not want to get MRI, CT scan left leg, foot ordered.  Acute toxic metabolic encephalopathy- In the setting of sepsis, multiple opiate medications, psychiatry medications. Psychiatry evaluation. Continue  antibiotics. Avoid opiates if possible. Delirium precautions.  Chronic pain syndrome- Oxycodone  IR dose decreased to 5mg  q4hr PRN. Avoid opiates if he is more sleepy.  COPD- Does have wheezes, today requiring 5L o2. ABG showed low PO2.  Wean O2 gradually. Continue duonebs PRN.  Hypertension- BP lower side. Caution with antihypertensives, opiates.       Out of bed to chair. Incentive spirometry. Nursing supportive care. Fall, aspiration precautions. Diet:  Diet Orders (From admission, onward)     Start     Ordered   02/21/24 1729  Diet Carb Modified Fluid consistency: Thin; Room service appropriate? Yes  Diet effective now       Question Answer Comment  Diet-HS Snack? Nothing   Calorie Level Medium 1600-2000   Fluid consistency: Thin   Room service appropriate? Yes      02/21/24 1728           DVT prophylaxis:   Level of care: Med-Surg   Code Status: Full Code  Subjective: Patient is seen and examined today morning. He was more lethargic today morning. Saturation lower side, requiring 5L. Advised RN to hold his meds until more awake. Later in the afternoon, he seems more awake and able to answer me.  Physical Exam: Vitals:   02/25/24 2353 02/26/24 0412 02/26/24 1103 02/26/24 1147  BP: 124/60 (!) 100/57 (!) 123/56 111/61  Pulse: (!) 50 (!) 51 65 94  Resp: 20 19    Temp: 98.1 F (36.7 C) 97.8 F (36.6 C) 97.9 F (36.6 C) (!) 100.4 F (38 C)  TempSrc:      SpO2: 93% (!) 80% 95% 100%  Weight:      Height:  General - Elderly Caucasian ill male, mild respiratory distress HEENT - PERRLA, EOMI, atraumatic head, non tender sinuses. Lung - Clear, basal rales, wheezes. Heart - S1, S2 heard, no murmurs, rubs, left sided pedal edema. Abdomen - Soft, non tender, bowel sounds good Neuro - Alert, awake and oriented, non focal exam. Skin - Warm and dry.  Data Reviewed:      Latest Ref Rng & Units 02/26/2024    9:50 AM 02/24/2024    9:40 AM 02/22/2024     5:08 AM  CBC  WBC 4.0 - 10.5 K/uL 11.2  5.4  11.6   Hemoglobin 13.0 - 17.0 g/dL 89.8  9.3  9.6   Hematocrit 39.0 - 52.0 % 32.2  29.7  30.8   Platelets 150 - 400 K/uL 160  149  162       Latest Ref Rng & Units 02/26/2024    9:50 AM 02/24/2024    9:40 AM 02/22/2024    5:08 AM  BMP  Glucose 70 - 99 mg/dL 867  82  99   BUN 8 - 23 mg/dL 32  24  25   Creatinine 0.61 - 1.24 mg/dL 7.99  8.14  8.00   Sodium 135 - 145 mmol/L 140  143  141   Potassium 3.5 - 5.1 mmol/L 4.1  4.1  4.4   Chloride 98 - 111 mmol/L 112  110  108   CO2 22 - 32 mmol/L 18  21  21    Calcium  8.9 - 10.3 mg/dL 8.4  8.7  8.7    No results found.  Family Communication: Discussed with patient, daughter over phone. They understand and agree. All questions answered.  Disposition: Status is: Inpatient Remains inpatient appropriate because: severity of illness  Planned Discharge Destination: Home with Home Health     Time spent: 52 minutes  Author: Concepcion Riser, MD 02/26/2024 2:19 PM Secure chat 7am to 7pm For on call review [email or web address].

## 2024-02-26 NOTE — Progress Notes (Signed)
 Date and time results received: 02/26/24 0920 (use smartphrase .now to insert current time)  Test: PO2 Critical Value: 37  Name of Provider Notified: Darci Pore  Orders Received? Or Actions Taken?: MD notified awaiting further instructions.

## 2024-02-26 NOTE — Progress Notes (Signed)
 Patient a little more alert now but refusing everything at the moment, will not let RN give him medications or asses at the moment states  get the hell away MD made aware asked if keppra  can be made IV.

## 2024-02-27 DIAGNOSIS — A419 Sepsis, unspecified organism: Secondary | ICD-10-CM | POA: Diagnosis not present

## 2024-02-27 DIAGNOSIS — G894 Chronic pain syndrome: Secondary | ICD-10-CM | POA: Diagnosis not present

## 2024-02-27 DIAGNOSIS — J439 Emphysema, unspecified: Secondary | ICD-10-CM

## 2024-02-27 DIAGNOSIS — F05 Delirium due to known physiological condition: Secondary | ICD-10-CM | POA: Diagnosis not present

## 2024-02-27 LAB — CBC WITH DIFFERENTIAL/PLATELET
Abs Immature Granulocytes: 0.1 K/uL — ABNORMAL HIGH (ref 0.00–0.07)
Basophils Absolute: 0 K/uL (ref 0.0–0.1)
Basophils Relative: 0 %
Eosinophils Absolute: 0.6 K/uL — ABNORMAL HIGH (ref 0.0–0.5)
Eosinophils Relative: 6 %
HCT: 28.6 % — ABNORMAL LOW (ref 39.0–52.0)
Hemoglobin: 9.1 g/dL — ABNORMAL LOW (ref 13.0–17.0)
Immature Granulocytes: 1 %
Lymphocytes Relative: 7 %
Lymphs Abs: 0.7 K/uL (ref 0.7–4.0)
MCH: 29.2 pg (ref 26.0–34.0)
MCHC: 31.8 g/dL (ref 30.0–36.0)
MCV: 91.7 fL (ref 80.0–100.0)
Monocytes Absolute: 0.5 K/uL (ref 0.1–1.0)
Monocytes Relative: 5 %
Neutro Abs: 7.9 K/uL — ABNORMAL HIGH (ref 1.7–7.7)
Neutrophils Relative %: 81 %
Platelets: 150 K/uL (ref 150–400)
RBC: 3.12 MIL/uL — ABNORMAL LOW (ref 4.22–5.81)
RDW: 15.4 % (ref 11.5–15.5)
WBC: 9.8 K/uL (ref 4.0–10.5)
nRBC: 0 % (ref 0.0–0.2)

## 2024-02-27 LAB — BASIC METABOLIC PANEL WITH GFR
Anion gap: 14 (ref 5–15)
BUN: 39 mg/dL — ABNORMAL HIGH (ref 8–23)
CO2: 17 mmol/L — ABNORMAL LOW (ref 22–32)
Calcium: 8.1 mg/dL — ABNORMAL LOW (ref 8.9–10.3)
Chloride: 106 mmol/L (ref 98–111)
Creatinine, Ser: 2.69 mg/dL — ABNORMAL HIGH (ref 0.61–1.24)
GFR, Estimated: 26 mL/min — ABNORMAL LOW (ref >60)
Glucose, Bld: 132 mg/dL — ABNORMAL HIGH (ref 70–99)
Potassium: 3.7 mmol/L (ref 3.5–5.1)
Sodium: 137 mmol/L (ref 135–145)

## 2024-02-27 LAB — GLUCOSE, CAPILLARY
Glucose-Capillary: 121 mg/dL — ABNORMAL HIGH (ref 70–99)
Glucose-Capillary: 100 mg/dL — ABNORMAL HIGH (ref 70–99)
Glucose-Capillary: 143 mg/dL — ABNORMAL HIGH (ref 70–99)
Glucose-Capillary: 112 mg/dL — ABNORMAL HIGH (ref 70–99)

## 2024-02-27 MED ORDER — ACETAMINOPHEN 325 MG PO TABS
650.0000 mg | ORAL_TABLET | Freq: Four times a day (QID) | ORAL | Status: DC | PRN
  Administered 2024-02-27 – 2024-02-29 (×2): 650 mg via ORAL
  Filled 2024-02-27 (×3): qty 2

## 2024-02-27 MED ORDER — NICOTINE 14 MG/24HR TD PT24
14.0000 mg | MEDICATED_PATCH | Freq: Every day | TRANSDERMAL | Status: DC
  Administered 2024-02-27 – 2024-03-01 (×4): 14 mg via TRANSDERMAL
  Filled 2024-02-27 (×4): qty 1

## 2024-02-27 MED ORDER — OXYMETAZOLINE HCL 0.05 % NA SOLN
1.0000 | Freq: Two times a day (BID) | NASAL | Status: AC
Start: 2024-02-27 — End: 2024-03-01
  Administered 2024-02-27 – 2024-02-29 (×3): 1 via NASAL
  Filled 2024-02-27: qty 30

## 2024-02-27 MED ORDER — OXYMETAZOLINE HCL 0.05 % NA SOLN: 1.0000 | Freq: Two times a day (BID) | NASAL | Status: DC

## 2024-02-27 NOTE — Progress Notes (Signed)
 Progress Note   Patient: Gregory [name] FMW:[id number] DOB: Mar 20, 1961 DOA: 02/21/2024     6 DOS: the patient was seen and examined on 02/27/2024   Brief hospital course: RENAUD CELLI is a 63 y.o. male with medical history significant of hypertension, hyperlipidemia, type 2 diabetes on insulin , cervical spinal stenosis, high output ileostomy, COPD, right AKA, chronic pain syndrome on multiple narcotics, stroke, and recent admission in 12/2023 for GBS bacteremia and encephalopathy presented with confusion/ encephalopathy and found to have sepsis 2/2 LLE cellulitis.   In the ED, pt hypertensive, tachycardic, and tachypneic w/o hypoxia. Febrile to 104. Labs notable for Cr 2.13, WBC 16.6, and lactic acid 2. EDP started IV abx and requested medicine admission for sepsis.    He ws found to have  possible Osteomyelitis in the third metatarsal head versus nondisplaced fracture. Bony erosions along the tufts of the distal phalanx of the fourth and fifth digits, possible osteomyelitis on x rays of the foot. Refused to get MRI due to claustrophobia. Orthopedics consulted. He has been on and off agitated and lethargic with medications.  Assessment and Plan: Sepsis due to left lower extremity cellulitis- Possible osteomyelitis of 3rd metatarsal, 4th, 5th distal phalanx - Presented with fever, tachycardic, tachypneic, with leukocytosis and elevated lactic acid  Continue broad spectrum IV antibiotics Zyvox , Zosyn .  Sepsis physiology improving.  CRP is 11.8, sed rate around 69. Resp panel is negative. UA  unremarkable. Chest xray shows possible bronchitis. Blood cultures negative so far.  He has claustrophobia and does not want to get MRI,  CT scan left leg, foot reviewed. Left ABI within normal range. Ortho consult pending.  Acute toxic metabolic encephalopathy- In the setting of sepsis, multiple opiate medications, psychiatry medications. Psychiatry follow up appreciated.  Continue antibiotics for  infection. Avoid opiates if possible. Delirium precautions.  Chronic pain syndrome- Oxycodone  IR dose decreased to 5mg  q4hr PRN. Avoid opiates if he is more sleepy.  COPD- Does have wheezes, today requiring 5L o2. ABG showed low PO2.  Wean O2 gradually. Continue duonebs PRN.  Hypertension- BP lower side. Caution with antihypertensives, opiates.       Out of bed to chair. Incentive spirometry. Nursing supportive care. Fall, aspiration precautions. Diet:  Diet Orders (From admission, onward)     Start     Ordered   02/21/24 1729  Diet Carb Modified Fluid consistency: Thin; Room service appropriate? Yes  Diet effective now       Question Answer Comment  Diet-HS Snack? Nothing   Calorie Level Medium 1600-2000   Fluid consistency: Thin   Room service appropriate? Yes      02/21/24 1728           DVT prophylaxis:   Level of care: Med-Surg   Code Status: Full Code  Subjective: Patient is seen and examined today morning. He is more alert today, off supplemental O2. States leg swelling, redness improved. Did not woirk with PT.  Physical Exam: Vitals:   02/27/24 0622 02/27/24 0750 02/27/24 1154 02/27/24 1706  BP: (!) 103/51 (!) 100/58 (!) 103/57 (!) 101/55  Pulse: 60 (!) 56 66 70  Resp: 16 18 18 18   Temp: 98.1 F (36.7 C) 97.8 F (36.6 C) 98.3 F (36.8 C) (!) 97.5 F (36.4 C)  TempSrc: Oral Oral Oral Oral  SpO2: 96% 98% 91% 97%  Weight:      Height:        General - Elderly Caucasian ill male, no respiratory distress HEENT - PERRLA,  EOMI, atraumatic head, non tender sinuses. Lung - Clear, basal rales, wheezes. Heart - S1, S2 heard, no murmurs, rubs, left foot pedal edema. Abdomen - Soft, non tender, bowel sounds good Neuro - Alert, awake and oriented, non focal exam. Skin - Warm and dry. 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  Data Reviewed:      Latest Ref Rng & Units 02/27/2024    5:16 AM 02/26/2024    9:50 AM 02/24/2024    9:40 AM  CBC   WBC 4.0 - 10.5 K/uL 9.8  11.2  5.4   Hemoglobin 13.0 - 17.0 g/dL 9.1  89.8  9.3   Hematocrit 39.0 - 52.0 % 28.6  32.2  29.7   Platelets 150 - 400 K/uL 150  160  149       Latest Ref Rng & Units 02/27/2024    5:16 AM 02/26/2024    9:50 AM 02/24/2024    9:40 AM  BMP  Glucose 70 - 99 mg/dL 867  867  82   BUN 8 - 23 mg/dL 39  32  24   Creatinine 0.61 - 1.24 mg/dL 7.30  7.99  8.14   Sodium 135 - 145 mmol/L 137  140  143   Potassium 3.5 - 5.1 mmol/L 3.7  4.1  4.1   Chloride 98 - 111 mmol/L 106  112  110   CO2 22 - 32 mmol/L 17  18  21    Calcium  8.9 - 10.3 mg/dL 8.1  8.4  8.7    CT FOOT LEFT WO CONTRAST Result Date: 02/26/2024 CLINICAL DATA:  Soft tissue infection and possible osteomyelitis EXAM: CT OF THE LEFT FOOT WITHOUT CONTRAST TECHNIQUE: Multidetector CT imaging of the left foot was performed according to the standard protocol. Multiplanar CT image reconstructions were also generated. RADIATION DOSE REDUCTION: This exam was performed according to the departmental dose-optimization program which includes automated exposure control, adjustment of the mA and/or kV according to patient size and/or use of iterative reconstruction technique. COMPARISON:  Radiographs 12/13/2023 FINDINGS: Bones/Joint/Cartilage Prior amputations in the foot including first digit mid to distal metatarsal level amputation; second digit distal metatarsal metadiaphysis amputation; and third digit amputation at the distal metaphysis of the proximal phalanx. First digit sesamoids are still present. The fused mid and distal phalanges of the small toe as shown on 06/03/2018 are substantially eroded distally with the combined length only 0.5 cm as compared to 1.8 cm on 06/03/2018. This could be from a prior amputation or from active erosion and osteomyelitis. Correlate with any history of fifth toe amputation. The soft tissue mantle overlying the tip of the proximal phalanx fourth toe is only about 3 mm thick. Ligaments Suboptimally  assessed by CT. Muscles and Tendons Mildly thickened distal Achilles tendon may reflect mild Achilles tendinopathy. Diffuse muscular atrophy in the foot. Soft tissues Notable subcutaneous edema circumferentially at the ankle and tracking in the dorsum of the foot and into the toes. No drainable abscess seen. IMPRESSION: 1. The fused mid and distal phalanges of the small toe as shown on 06/03/2018 are substantially eroded distally with the combined length only 0.5 cm as compared to 1.8 cm on 06/03/2018. This could be from a prior amputation or from active erosion and osteomyelitis. Correlate with any history of fifth toe amputation. 2. Prior amputations in the foot including first digit mid to distal metatarsal level amputation; second digit distal metatarsal metadiaphysis amputation; and third digit amputation at the distal metaphysis of the proximal phalanx. 3.  Notable subcutaneous edema circumferentially at the ankle and tracking in the dorsum of the foot and into the toes. No drainable abscess seen. 4. Mildly thickened distal Achilles tendon may reflect mild Achilles tendinopathy. 5. Diffuse muscular atrophy in the foot. Electronically Signed   By: Ryan Salvage M.D.   On: 02/26/2024 16:39   CT TIBIA FIBULA LEFT WO CONTRAST Result Date: 02/26/2024 CLINICAL DATA:  Soft tissue infection in the lower leg/foot EXAM: CT OF THE LOWER LEFT EXTREMITY WITHOUT CONTRAST TECHNIQUE: Multidetector CT imaging of the left calf was performed according to the standard protocol. RADIATION DOSE REDUCTION: This exam was performed according to the departmental dose-optimization program which includes automated exposure control, adjustment of the mA and/or kV according to patient size and/or use of iterative reconstruction technique. COMPARISON:  Radiographs 09/18/2022 FINDINGS: Bones/Joint/Cartilage Mild osteoarthritis of the knee consisting primarily of medial compartmental articular space narrowing with some marginal spurring  in the medial compartment and patellofemoral joint. Trace knee effusion. No bony destructive findings in the tibia or fibula to indicate osteomyelitis. Ligaments Suboptimally assessed by CT. Muscles and Tendons Severe muscular atrophy in the calf and involving the distal semimembranosus muscle. Soft tissues Subcutaneous in the calf circumferentially distally and tracking into the dorsum of the ankle. More cephalad the edema is primarily anterior in location. This edema appears infiltrative and is associated with some cutaneous thickening surface irregularity. No gas identified tracking in the soft tissues. No drainable process identified in the calf. Arterial atherosclerotic calcifications. IMPRESSION: 1. Subcutaneous edema in the calf circumferentially distally and tracking into the dorsum of the ankle. This edema appears infiltrative and is associated with some cutaneous thickening surface irregularity. No gas identified tracking in the soft tissues. No drainable process identified in the calf. 2. Severe muscular atrophy in the calf and involving the distal semimembranosus muscle. 3. Mild osteoarthritis of the knee. 4. Trace knee effusion. 5. Arterial atherosclerotic calcifications. Electronically Signed   By: Ryan Salvage M.D.   On: 02/26/2024 16:32   DG CHEST PORT 1 VIEW Result Date: 02/26/2024 CLINICAL DATA:  Hypoxia EXAM: PORTABLE CHEST 1 VIEW COMPARISON:  02/21/2024 FINDINGS: Atherosclerotic calcification of the aortic arch. Heart size within normal limits. Airway thickening noted. Mild interstitial accentuation, as can commonly be encountered in smokers. No discrete airspace opacity is identified. Postoperative findings in the cervical spine. IMPRESSION: 1. Airway thickening is present, suggesting bronchitis or reactive airways disease. 2. Mild interstitial accentuation, as can commonly be encountered in smokers. 3. Aortic Atherosclerosis (ICD10-I70.0). Electronically Signed   By: Ryan Salvage  M.D.   On: 02/26/2024 15:56    Family Communication: Discussed with patient, he  understand and agree. All questions answered.  Disposition: Status is: Inpatient Remains inpatient appropriate because: severity of illness  Planned Discharge Destination: Home with Home Health     Time spent: 46 minutes  Author: Concepcion Riser, MD 02/27/2024 5:25 PM Secure chat 7am to 7pm For on call review [email or web address].

## 2024-02-27 NOTE — Plan of Care (Signed)
  Problem: Safety: Goal: Non-violent Restraint(s) Outcome: Progressing   Problem: Education: Goal: Knowledge of General Education information will improve Description: Including pain rating scale, medication(s)/side effects and non-pharmacologic comfort measures Outcome: Progressing   Problem: Health Behavior/Discharge Planning: Goal: Ability to manage health-related needs will improve Outcome: Progressing   Problem: Clinical Measurements: Goal: Ability to maintain clinical measurements within normal limits will improve Outcome: Progressing Goal: Will remain free from infection Outcome: Progressing Goal: Diagnostic test results will improve Outcome: Progressing Goal: Respiratory complications will improve Outcome: Progressing Goal: Cardiovascular complication will be avoided Outcome: Progressing   Problem: Activity: Goal: Risk for activity intolerance will decrease Outcome: Progressing   Problem: Nutrition: Goal: Adequate nutrition will be maintained Outcome: Progressing   Problem: Coping: Goal: Level of anxiety will decrease Outcome: Progressing   Problem: Elimination: Goal: Will not experience complications related to bowel motility Outcome: Progressing Goal: Will not experience complications related to urinary retention Outcome: Progressing   Problem: Pain Managment: Goal: General experience of comfort will improve and/or be controlled Outcome: Progressing   Problem: Safety: Goal: Ability to remain free from injury will improve Outcome: Progressing   Problem: Skin Integrity: Goal: Risk for impaired skin integrity will decrease Outcome: Progressing   Problem: Education: Goal: Ability to describe self-care measures that may prevent or decrease complications (Diabetes Survival Skills Education) will improve Outcome: Progressing Goal: Individualized Educational Video(s) Outcome: Progressing   Problem: Coping: Goal: Ability to adjust to condition or change  in health will improve Outcome: Progressing   Problem: Fluid Volume: Goal: Ability to maintain a balanced intake and output will improve Outcome: Progressing   Problem: Health Behavior/Discharge Planning: Goal: Ability to identify and utilize available resources and services will improve Outcome: Progressing Goal: Ability to manage health-related needs will improve Outcome: Progressing   Problem: Metabolic: Goal: Ability to maintain appropriate glucose levels will improve Outcome: Progressing   Problem: Nutritional: Goal: Maintenance of adequate nutrition will improve Outcome: Progressing Goal: Progress toward achieving an optimal weight will improve Outcome: Progressing   Problem: Skin Integrity: Goal: Risk for impaired skin integrity will decrease Outcome: Progressing   Problem: Tissue Perfusion: Goal: Adequacy of tissue perfusion will improve Outcome: Progressing

## 2024-02-27 NOTE — Progress Notes (Signed)
 PT Cancellation Note  Patient Details Name: Gregory [name] MRN: [id number] DOB: Sep 17, 1960   Cancelled Treatment:    Reason Eval/Treat Not Completed: Patient declined, no reason specified; attempted x 2 and pt declined mobility due to on the phone and due to pain and ostomy leaking (assisted with cleaning).  Requested PT return another day.  Will continue attempts.    Montie Portal 02/27/2024, 1:13 PM Micheline Portal, PT Acute Rehabilitation Services Office:[phone] 02/27/2024

## 2024-02-27 NOTE — Progress Notes (Signed)
 OT Cancellation Note  Patient Details Name: Gregory [name] MRN: [id number] DOB: [birth year]   Cancelled Treatment:    Reason Eval/Treat Not Completed: Patient declined, no reason specified (patient adamently declining OOB and therapy at this time.  OT to reattempt later today as schedule permits.)  Jakara Blatter L Dusty 02/27/2024, 11:15 AM  Dick Dusty, OTA Acute Rehabilitation Services  Office [phone]

## 2024-02-28 DIAGNOSIS — F05 Delirium due to known physiological condition: Secondary | ICD-10-CM | POA: Diagnosis not present

## 2024-02-28 DIAGNOSIS — A419 Sepsis, unspecified organism: Secondary | ICD-10-CM | POA: Diagnosis not present

## 2024-02-28 DIAGNOSIS — G894 Chronic pain syndrome: Secondary | ICD-10-CM | POA: Diagnosis not present

## 2024-02-28 DIAGNOSIS — J439 Emphysema, unspecified: Secondary | ICD-10-CM | POA: Diagnosis not present

## 2024-02-28 LAB — GLUCOSE, CAPILLARY
Glucose-Capillary: 193 mg/dL — ABNORMAL HIGH (ref 70–99)
Glucose-Capillary: 84 mg/dL (ref 70–99)
Glucose-Capillary: 104 mg/dL — ABNORMAL HIGH (ref 70–99)
Glucose-Capillary: 149 mg/dL — ABNORMAL HIGH (ref 70–99)

## 2024-02-28 MED ORDER — OXYCODONE HCL 5 MG PO TABS: 5.0000 mg | ORAL_TABLET | Freq: Four times a day (QID) | ORAL | 0 refills | Status: AC | PRN

## 2024-02-28 MED ORDER — LEVETIRACETAM 500 MG PO TABS
500.0000 mg | ORAL_TABLET | Freq: Two times a day (BID) | ORAL | Status: DC
  Administered 2024-02-28 – 2024-03-01 (×4): 500 mg via ORAL
  Filled 2024-02-28 (×4): qty 1

## 2024-02-28 MED ORDER — ATORVASTATIN CALCIUM 80 MG PO TABS: 80.0000 mg | ORAL_TABLET | Freq: Every day | ORAL | 0 refills | Status: AC

## 2024-02-28 MED ORDER — QUETIAPINE FUMARATE 25 MG PO TABS: 25.0000 mg | ORAL_TABLET | Freq: Every evening | ORAL | 1 refills | Status: AC | PRN

## 2024-02-28 MED ORDER — OXYCODONE HCL ER 15 MG PO T12A: 15.0000 mg | EXTENDED_RELEASE_TABLET | Freq: Two times a day (BID) | ORAL | 0 refills | Status: AC

## 2024-02-28 MED ORDER — AMOXICILLIN-POT CLAVULANATE 875-125 MG PO TABS: 1.0000 | ORAL_TABLET | Freq: Two times a day (BID) | ORAL | 0 refills | Status: AC

## 2024-02-28 NOTE — Congregational Nurse Program (Signed)
 Pt refused wound care, bath, and bed sheet changes

## 2024-02-28 NOTE — TOC Progression Note (Signed)
 Transition of Care [facility]) - Progression Note    Patient Details  Name: Gregory [name] MRN: [id number] Date of Birth: Jun 10, 1961  Transition of Care [facility]) CM/SW Contact  Quashon Jesus A [place], LCSW Phone Number: 02/28/2024, 2:25 PM  Clinical Narrative:     CSW was notified of pt being medically ready for DC. CSW notified [facility], bed is not available, facility full. CSW was informed to reach out to weekend liaison at [facility] over weekend to find out when bed would be available. [name] daughter [name] contacted and notified. MD and nursing notified.   CSW will continue to follow.   Expected Discharge Plan: Skilled Nursing Facility Barriers to Discharge: Continued Medical Work up               Expected Discharge Plan and Services In-house Referral: Clinical Social Work     Living arrangements for the past 2 months: Skilled Nursing Facility Expected Discharge Date: 02/28/24                                     Social Drivers of Health (SDOH) Interventions SDOH Screenings   Food Insecurity: No Food Insecurity (02/23/2024)  Housing: Low Risk  (02/23/2024)  Transportation Needs: No Transportation Needs (02/23/2024)  Utilities: Not At Risk (02/23/2024)  Depression (PHQ2-9): Low Risk  (03/08/2021)  Social Connections: Socially Isolated (09/26/2023)  Tobacco Use: High Risk (12/24/2023)    Readmission Risk Interventions    09/04/2023    1:49 PM 09/06/2022    1:48 PM 08/27/2022   12:46 PM  Readmission Risk Prevention Plan  Transportation Screening Complete Complete Complete  Medication Review Oceanographer) Complete Complete Complete  PCP or Specialist appointment within 3-5 days of discharge Complete Complete Complete  HRI or Home Care Consult Complete Complete Complete  SW Recovery Care/Counseling Consult Complete Complete Complete  Palliative Care Screening Not Applicable Not Applicable Not Applicable  Skilled Nursing Facility Complete Complete Complete

## 2024-02-28 NOTE — Progress Notes (Signed)
 Patient's daughter brought reading glasses, an adult brief, shorts, and a T-shirt for patient. Belongings placed in patient's  room closet.

## 2024-02-28 NOTE — Plan of Care (Signed)
 Problem: Safety: Goal: Non-violent Restraint(s) 02/28/2024 0435 by Lindalou Dewitte POUR, RN Outcome: Progressing 02/28/2024 0435 by Lindalou Dewitte POUR, RN Outcome: Progressing   Problem: Education: Goal: Knowledge of General Education information will improve Description: Including pain rating scale, medication(s)/side effects and non-pharmacologic comfort measures 02/28/2024 0435 by Lindalou Dewitte POUR, RN Outcome: Progressing 02/28/2024 0435 by Lindalou Dewitte POUR, RN Outcome: Progressing   Problem: Health Behavior/Discharge Planning: Goal: Ability to manage health-related needs will improve 02/28/2024 0435 by Lindalou Dewitte POUR, RN Outcome: Progressing 02/28/2024 0435 by Lindalou Dewitte POUR, RN Outcome: Progressing   Problem: Clinical Measurements: Goal: Ability to maintain clinical measurements within normal limits will improve 02/28/2024 0435 by Lindalou Dewitte POUR, RN Outcome: Progressing 02/28/2024 0435 by Lindalou Dewitte POUR, RN Outcome: Progressing Goal: Will remain free from infection 02/28/2024 0435 by Lindalou Dewitte POUR, RN Outcome: Progressing 02/28/2024 0435 by Lindalou Dewitte POUR, RN Outcome: Progressing Goal: Diagnostic test results will improve 02/28/2024 0435 by Lindalou Dewitte POUR, RN Outcome: Progressing 02/28/2024 0435 by Lindalou Dewitte POUR, RN Outcome: Progressing Goal: Respiratory complications will improve 02/28/2024 0435 by Lindalou Dewitte POUR, RN Outcome: Progressing 02/28/2024 0435 by Lindalou Dewitte POUR, RN Outcome: Progressing Goal: Cardiovascular complication will be avoided 02/28/2024 0435 by Brannan Cassedy K, RN Outcome: Progressing 02/28/2024 0435 by Kalena Mander K, RN Outcome: Progressing   Problem: Activity: Goal: Risk for activity intolerance will decrease 02/28/2024 0435 by Lindalou Dewitte POUR, RN Outcome: Progressing 02/28/2024 0435 by Lindalou Dewitte POUR, RN Outcome: Progressing   Problem: Nutrition: Goal: Adequate nutrition will be maintained 02/28/2024 0435 by  Lindalou Dewitte POUR, RN Outcome: Progressing 02/28/2024 0435 by Lindalou Dewitte POUR, RN Outcome: Progressing   Problem: Coping: Goal: Level of anxiety will decrease 02/28/2024 0435 by Lindalou Dewitte POUR, RN Outcome: Progressing 02/28/2024 0435 by Lindalou Dewitte POUR, RN Outcome: Progressing   Problem: Elimination: Goal: Will not experience complications related to bowel motility 02/28/2024 0435 by Lindalou Dewitte POUR, RN Outcome: Progressing 02/28/2024 0435 by Lindalou Dewitte POUR, RN Outcome: Progressing Goal: Will not experience complications related to urinary retention 02/28/2024 0435 by Lindalou Dewitte POUR, RN Outcome: Progressing 02/28/2024 0435 by Lindalou Dewitte POUR, RN Outcome: Progressing   Problem: Pain Managment: Goal: General experience of comfort will improve and/or be controlled 02/28/2024 0435 by Lindalou Dewitte POUR, RN Outcome: Progressing 02/28/2024 0435 by Lindalou Dewitte POUR, RN Outcome: Progressing   Problem: Safety: Goal: Ability to remain free from injury will improve 02/28/2024 0435 by Lindalou Dewitte POUR, RN Outcome: Progressing 02/28/2024 0435 by Lindalou Dewitte POUR, RN Outcome: Progressing   Problem: Skin Integrity: Goal: Risk for impaired skin integrity will decrease 02/28/2024 0435 by Lindalou Dewitte POUR, RN Outcome: Progressing 02/28/2024 0435 by Lindalou Dewitte POUR, RN Outcome: Progressing   Problem: Education: Goal: Ability to describe self-care measures that may prevent or decrease complications (Diabetes Survival Skills Education) will improve 02/28/2024 0435 by Lindalou Dewitte POUR, RN Outcome: Progressing 02/28/2024 0435 by Lindalou Dewitte POUR, RN Outcome: Progressing Goal: Individualized Educational Video(s) 02/28/2024 0435 by Lindalou Dewitte POUR, RN Outcome: Progressing 02/28/2024 0435 by Lindalou Dewitte POUR, RN Outcome: Progressing   Problem: Coping: Goal: Ability to adjust to condition or change in health will improve 02/28/2024 0435 by Lindalou Dewitte POUR, RN Outcome:  Progressing 02/28/2024 0435 by Lindalou Dewitte POUR, RN Outcome: Progressing   Problem: Fluid Volume: Goal: Ability to maintain a balanced intake and output will improve 02/28/2024 0435 by Kimmberly Wisser K, RN Outcome: Progressing 02/28/2024 0435 by Deng Kemler K, RN Outcome: Progressing   Problem: Health Behavior/Discharge  Planning: Goal: Ability to identify and utilize available resources and services will improve 02/28/2024 0435 by Moritz Lever K, RN Outcome: Progressing 02/28/2024 0435 by Lindalou Dewitte POUR, RN Outcome: Progressing Goal: Ability to manage health-related needs will improve 02/28/2024 0435 by Lindalou Dewitte POUR, RN Outcome: Progressing 02/28/2024 0435 by Lindalou Dewitte POUR, RN Outcome: Progressing   Problem: Metabolic: Goal: Ability to maintain appropriate glucose levels will improve 02/28/2024 0435 by Lindalou Dewitte POUR, RN Outcome: Progressing 02/28/2024 0435 by Lindalou Dewitte POUR, RN Outcome: Progressing   Problem: Nutritional: Goal: Maintenance of adequate nutrition will improve 02/28/2024 0435 by Claud Gowan K, RN Outcome: Progressing 02/28/2024 0435 by Lindalou Dewitte POUR, RN Outcome: Progressing Goal: Progress toward achieving an optimal weight will improve 02/28/2024 0435 by Lindalou Dewitte POUR, RN Outcome: Progressing 02/28/2024 0435 by Lindalou Dewitte POUR, RN Outcome: Progressing   Problem: Skin Integrity: Goal: Risk for impaired skin integrity will decrease 02/28/2024 0435 by Lindalou Dewitte POUR, RN Outcome: Progressing 02/28/2024 0435 by Lindalou Dewitte POUR, RN Outcome: Progressing   Problem: Tissue Perfusion: Goal: Adequacy of tissue perfusion will improve 02/28/2024 0435 by Iretha Kirley K, RN Outcome: Progressing 02/28/2024 0435 by Aastha Dayley K, RN Outcome: Progressing

## 2024-02-28 NOTE — Discharge Summary (Signed)
 Physician Discharge Summary   Patient: Gregory [name] MRN: [id number] DOB: 1960/10/16  Admit date:     02/21/2024  Discharge date: 03/01/24  Discharge Physician: Concepcion Riser   PCP: [name], Provider, MD   Recommendations at discharge:   PCP follow up in 1 week.  Repeat BMP in 1 week. Ortho follow up with Dr. Harden as scheduled.  Discharge Diagnoses: Principal Problem:   Sepsis (HCC) Active Problems:   Sepsis with acute renal failure without septic shock (HCC)   DM2 (diabetes mellitus, type 2) (HCC)   S/P AKA (above knee amputation) unilateral, right (HCC)   Chronic kidney disease, stage 3b (HCC)   High output ileostomy (HCC)   Acute encephalopathy   Acute hyperactive delirium due to another medical condition  Resolved Problems:   * No resolved hospital problems. *  Hospital Course: Gregory [name] is a 63 y.o. male with medical history significant of hypertension, hyperlipidemia, type 2 diabetes on insulin , cervical spinal stenosis, high output ileostomy, COPD, right AKA, chronic pain syndrome on multiple narcotics, stroke, and recent admission in 12/2023 for GBS bacteremia and encephalopathy presented with confusion/ encephalopathy and found to have sepsis 2/2 LLE cellulitis.   In the ED, pt hypertensive, tachycardic, and tachypneic w/o hypoxia. Febrile to 104. Labs notable for Cr 2.13, WBC 16.6, and lactic acid 2. EDP started IV abx and requested medicine admission for sepsis.    He ws found to have  possible Osteomyelitis in the third metatarsal head versus nondisplaced fracture. Bony erosions along the tufts of the distal phalanx of the fourth and fifth digits, possible osteomyelitis on x rays of the foot. Refused to get MRI due to claustrophobia. Orthopedics consulted. He has been on and off agitated and lethargic with medications. Refused to get any surgical intervention. Finished 8 days of zosyn , zyvox  his left foot redness, swelling improved. I advised augmentin  therapy.  Advised PCP and ortho follow up.   Assessment and Plan: Sepsis due to left lower extremity cellulitis- Possible osteomyelitis of 3rd metatarsal, 4th, 5th distal phalanx - Presented with fever, tachycardic, tachypneic, with leukocytosis and elevated lactic acid  He did receive 7 days IV antibiotics Zyvox , Zosyn .  CRP is 11.8, sed rate around 69.  Resp panel is negative. UA  unremarkable. Chest xray shows possible bronchitis. Blood cultures negative so far.  He has claustrophobia and does not want to get MRI,  CT scan left leg, foot reviewed. Left ABI within normal range. Sepsis physiology improved. His left leg, foot selling redness improved. Patient does not want any ortho intervention and refuses to stay in hospital for consultation.  I advised oral Augmentin . PCP follow up and ortho follow up advised.    Acute toxic metabolic encephalopathy- In the setting of sepsis, multiple opiate medications, psychiatry medications. Psychiatry follow up appreciated. Continue seroquel  at night. Continue antibiotics for infection. Avoid opiates if possible. Delirium precautions.  Acute on CKD stage 3B- Avoid nephrotoxic drugs. PCP follow up for repeat BMP in 1 week.  Chronic anemia- stable Hb.   Chronic pain syndrome- Prescribed Oxycodone  IR dose decreased to 5mg  q6hr PRN, Oxy IR 15mg  Q12 hr.   COPD- O2 weaned off. He is on room air. Continue duonebs PRN.   S/p ileostomy- Continue nursing care for ileostomy.       Pain control - [place]  Controlled Substance Reporting System database was reviewed. and patient was instructed, not to drive, operate heavy machinery, perform activities at heights, swimming or participation in water  activities or provide  baby-sitting services while on Pain, Sleep and Anxiety Medications; until their outpatient Physician has advised to do so again. Also recommended to not to take more than prescribed Pain, Sleep and Anxiety Medications.  Consultants:  psychiatry Procedures performed: none  Disposition: Skilled nursing facility Diet recommendation:  Discharge Diet Orders (From admission, onward)     Start     Ordered   02/28/24 0000  Diet - low sodium heart healthy        02/28/24 1032           Cardiac and Carb modified diet DISCHARGE MEDICATION: Allergies as of 03/01/2024       Reactions   Latex Other (See Comments)   Allergic, per [facility]        Medication List     TAKE these medications    acetaminophen  325 MG tablet Commonly known as: TYLENOL  Take 2 tablets (650 mg total) by mouth every 6 (six) hours as needed for mild pain (pain score 1-3) (or Fever >/= 101).   albuterol  (2.5 MG/3ML) 0.083% nebulizer solution Commonly known as: PROVENTIL  Take 2.5 mg by nebulization every 6 (six) hours as needed for wheezing or shortness of breath.   albuterol  108 (90 Base) MCG/ACT inhaler Commonly known as: VENTOLIN  HFA Inhale 2 puffs into the lungs every 6 (six) hours as needed for wheezing or shortness of breath.   amoxicillin -clavulanate 875-125 MG tablet Commonly known as: AUGMENTIN  Take 1 tablet by mouth 2 (two) times daily for 7 days.   aspirin  EC 81 MG tablet Take 81 mg by mouth daily.   atorvastatin  80 MG tablet Commonly known as: LIPITOR  Take 1 tablet (80 mg total) by mouth daily.   busPIRone  10 MG tablet Commonly known as: BUSPAR  Take 10 mg by mouth 3 (three) times daily.   Calcium  Carbonate Antacid 648 MG Tabs Take 2 tablets by mouth every 8 (eight) hours as needed (Indigestion).   Cholecalciferol  25 MCG (1000 UT) capsule Take 2,000 Units by mouth daily.   docusate sodium  100 MG capsule Commonly known as: COLACE Take 100 mg by mouth every 12 (twelve) hours.   doxepin  25 MG capsule Commonly known as: SINEQUAN  Take 50 mg by mouth at bedtime.   ferrous sulfate  325 (65 FE) MG tablet Take 1 tablet (325 mg total) by mouth 3 (three) times daily with meals.   hydrOXYzine  25 MG tablet Commonly known  as: ATARAX  Take 25 mg by mouth every 6 (six) hours as needed for anxiety.   insulin  lispro 100 UNIT/ML KwikPen Commonly known as: HUMALOG  Inject 0-10 Units into the skin See admin instructions. Inject 0-10 units into the skin three times a day and at bedtime, PER SLIDING SCALE:  BGL 151-200 = 2 units 201-250 = 4 units 251-300 = 6 units 301-350 =  8 units 351-400 = 10 units   Journavx  50 MG Tabs Generic drug: Suzetrigine  Take 1 tablet by mouth 2 (two) times daily.   lactulose  10 GM/15ML solution Commonly known as: CHRONULAC  Take 30 mLs (20 g total) by mouth 2 (two) times daily.   levETIRAcetam  500 MG tablet Commonly known as: Keppra  Take 1 tablet (500 mg total) by mouth 2 (two) times daily.   lidocaine  4 % Place 1 patch onto the skin daily. Apply 1 new patch to the affected area and remove 12 hours later   magnesium  oxide 400 (240 Mg) MG tablet Commonly known as: MAG-OX Take 400 mg by mouth 3 (three) times daily.   Melatonin 10 MG Tabs Take  10 mg by mouth at bedtime.   methocarbamol  500 MG tablet Commonly known as: ROBAXIN  Take 500 mg by mouth every 6 (six) hours as needed for muscle spasms.   multivitamin with minerals tablet Take 1 tablet by mouth daily.   Narcan  4 MG/0.1ML Liqd nasal spray kit Generic drug: naloxone  Place 1 spray into the nose See admin instructions. Instill 1 spray into alternating nostrils every 5 minutes as needed for an opiate overdose   naloxone  0.4 MG/ML injection Commonly known as: NARCAN  Inject 0.4 mg into the muscle as needed (for oversedation).   ondansetron  4 MG tablet Commonly known as: ZOFRAN  Take 4 mg by mouth every 6 (six) hours as needed for nausea or vomiting.   OXcarbazepine  150 MG tablet Commonly known as: TRILEPTAL  Take 1 tablet (150 mg total) by mouth 2 (two) times daily.   oxyCODONE  15 mg 12 hr tablet Commonly known as: OXYCONTIN  Take 1 tablet (15 mg total) by mouth every 12 (twelve) hours for 5 days. What changed:   medication strength how much to take when to take this   oxyCODONE  5 MG immediate release tablet Commonly known as: Oxy IR/ROXICODONE  Take 1 tablet (5 mg total) by mouth every 6 (six) hours as needed for up to 5 days for severe pain (pain score 7-10) or breakthrough pain. What changed: reasons to take this   pregabalin  200 MG capsule Commonly known as: LYRICA  Take 200 mg by mouth 2 (two) times daily.   QUEtiapine  25 MG tablet Commonly known as: SEROQUEL  Take 1 tablet (25 mg total) by mouth at bedtime and may repeat dose one time if needed.   senna 8.6 MG Tabs tablet Commonly known as: SENOKOT Take 2 tablets by mouth at bedtime.   sodium bicarbonate  650 MG tablet Take 650 mg by mouth 2 (two) times daily.   tamsulosin  0.4 MG Caps capsule Commonly known as: FLOMAX  Take 1 capsule (0.4 mg total) by mouth daily.   Uro-MP 118 MG Caps Take 118 mg by mouth 2 (two) times daily.               Discharge Care Instructions  (From admission, onward)           Start     Ordered   02/28/24 0000  Discharge wound care:       Comments: Cleanse with warm water , pat dry. Apply Desitin to the groin once a day or PRN. Peri-anal wounds: Cleanse with warm water , pat dry. Apply Medihoney daily to the wound bed, cover with foam dressing, change every 3 days or PRN.   02/28/24 1032            Contact information for after-discharge care     Destination     [facility] .   Service: Skilled Nursing Contact information: [address] [place]  [address] [phone]                    Discharge Exam: Filed Weights   02/21/24 0905 02/25/24 2135 02/29/24 1430  Weight: 111.1 kg 82.6 kg 85 kg      03/01/2024   12:38 PM 03/01/2024    7:42 AM 03/01/2024    4:00 AM  Vitals with BMI  Systolic 135 127 886  Diastolic 66 57 57  Pulse 49 44 44   General - Elderly Caucasian ill male, no respiratory distress HEENT - PERRLA, EOMI, atraumatic head,  non tender sinuses. Lung - Clear, basal rales, wheezes. Heart - S1, S2  heard, no murmurs, rubs, left foot pedal edema. Abdomen - Soft, non tender, ileostomy intact. Neuro - Alert, awake and oriented, non focal exam. Skin - Right AKA, left 1st, 2nd, 5th toe amputations. Left foot, leg redness improved.  Condition at discharge: stable  The results of significant diagnostics from this hospitalization (including imaging, microbiology, ancillary and laboratory) are listed below for reference.   Imaging Studies: CT FOOT LEFT WO CONTRAST Result Date: 02/26/2024 CLINICAL DATA:  Soft tissue infection and possible osteomyelitis EXAM: CT OF THE LEFT FOOT WITHOUT CONTRAST TECHNIQUE: Multidetector CT imaging of the left foot was performed according to the standard protocol. Multiplanar CT image reconstructions were also generated. RADIATION DOSE REDUCTION: This exam was performed according to the departmental dose-optimization program which includes automated exposure control, adjustment of the mA and/or kV according to patient size and/or use of iterative reconstruction technique. COMPARISON:  Radiographs 12/13/2023 FINDINGS: Bones/Joint/Cartilage Prior amputations in the foot including first digit mid to distal metatarsal level amputation; second digit distal metatarsal metadiaphysis amputation; and third digit amputation at the distal metaphysis of the proximal phalanx. First digit sesamoids are still present. The fused mid and distal phalanges of the small toe as shown on 06/03/2018 are substantially eroded distally with the combined length only 0.5 cm as compared to 1.8 cm on 06/03/2018. This could be from a prior amputation or from active erosion and osteomyelitis. Correlate with any history of fifth toe amputation. The soft tissue mantle overlying the tip of the proximal phalanx fourth toe is only about 3 mm thick. Ligaments Suboptimally assessed by CT. Muscles and Tendons Mildly thickened distal Achilles  tendon may reflect mild Achilles tendinopathy. Diffuse muscular atrophy in the foot. Soft tissues Notable subcutaneous edema circumferentially at the ankle and tracking in the dorsum of the foot and into the toes. No drainable abscess seen. IMPRESSION: 1. The fused mid and distal phalanges of the small toe as shown on 06/03/2018 are substantially eroded distally with the combined length only 0.5 cm as compared to 1.8 cm on 06/03/2018. This could be from a prior amputation or from active erosion and osteomyelitis. Correlate with any history of fifth toe amputation. 2. Prior amputations in the foot including first digit mid to distal metatarsal level amputation; second digit distal metatarsal metadiaphysis amputation; and third digit amputation at the distal metaphysis of the proximal phalanx. 3. Notable subcutaneous edema circumferentially at the ankle and tracking in the dorsum of the foot and into the toes. No drainable abscess seen. 4. Mildly thickened distal Achilles tendon may reflect mild Achilles tendinopathy. 5. Diffuse muscular atrophy in the foot. Electronically Signed   By: Ryan Salvage M.D.   On: 02/26/2024 16:39   CT TIBIA FIBULA LEFT WO CONTRAST Result Date: 02/26/2024 CLINICAL DATA:  Soft tissue infection in the lower leg/foot EXAM: CT OF THE LOWER LEFT EXTREMITY WITHOUT CONTRAST TECHNIQUE: Multidetector CT imaging of the left calf was performed according to the standard protocol. RADIATION DOSE REDUCTION: This exam was performed according to the departmental dose-optimization program which includes automated exposure control, adjustment of the mA and/or kV according to patient size and/or use of iterative reconstruction technique. COMPARISON:  Radiographs 09/18/2022 FINDINGS: Bones/Joint/Cartilage Mild osteoarthritis of the knee consisting primarily of medial compartmental articular space narrowing with some marginal spurring in the medial compartment and patellofemoral joint. Trace knee  effusion. No bony destructive findings in the tibia or fibula to indicate osteomyelitis. Ligaments Suboptimally assessed by CT. Muscles and Tendons Severe muscular atrophy in the calf and involving  the distal semimembranosus muscle. Soft tissues Subcutaneous in the calf circumferentially distally and tracking into the dorsum of the ankle. More cephalad the edema is primarily anterior in location. This edema appears infiltrative and is associated with some cutaneous thickening surface irregularity. No gas identified tracking in the soft tissues. No drainable process identified in the calf. Arterial atherosclerotic calcifications. IMPRESSION: 1. Subcutaneous edema in the calf circumferentially distally and tracking into the dorsum of the ankle. This edema appears infiltrative and is associated with some cutaneous thickening surface irregularity. No gas identified tracking in the soft tissues. No drainable process identified in the calf. 2. Severe muscular atrophy in the calf and involving the distal semimembranosus muscle. 3. Mild osteoarthritis of the knee. 4. Trace knee effusion. 5. Arterial atherosclerotic calcifications. Electronically Signed   By: Ryan Salvage M.D.   On: 02/26/2024 16:32   DG CHEST PORT 1 VIEW Result Date: 02/26/2024 CLINICAL DATA:  Hypoxia EXAM: PORTABLE CHEST 1 VIEW COMPARISON:  02/21/2024 FINDINGS: Atherosclerotic calcification of the aortic arch. Heart size within normal limits. Airway thickening noted. Mild interstitial accentuation, as can commonly be encountered in smokers. No discrete airspace opacity is identified. Postoperative findings in the cervical spine. IMPRESSION: 1. Airway thickening is present, suggesting bronchitis or reactive airways disease. 2. Mild interstitial accentuation, as can commonly be encountered in smokers. 3. Aortic Atherosclerosis (ICD10-I70.0). Electronically Signed   By: Ryan Salvage M.D.   On: 02/26/2024 15:56   VAS US  ABI WITH/WO TBI Result  Date: 02/25/2024  LOWER EXTREMITY DOPPLER STUDY Patient Name:  KWAKU MOSTAFA  Date of Exam:   02/24/2024 Medical Rec #: [id number]     Accession #:    [id number] Date of Birth: 01-31-1961    Patient Gender: M Patient Age:   25 years Exam Location:  [facility] Procedure:      VAS US  ABI WITH/WO TBI Referring Phys: EMMA COLLINS --------------------------------------------------------------------------------  Indications: Ischemic lower extremity. right AKA High Risk Factors: Hypertension, hyperlipidemia, Diabetes, current smoker. Other Factors: Patient unable to provide history.  Limitations: Today's exam was limited due to patient unable to lie flat, patient              intolerant to cuff pressure and involuntary patient movement. Comparison Study: Previous study on 10.9.2018. Performing Technologist: Edilia Elden Appl  Examination Guidelines: A complete evaluation includes at minimum, Doppler waveform signals and systolic blood pressure reading at the level of bilateral brachial, anterior tibial, and posterior tibial arteries, when vessel segments are accessible. Bilateral testing is considered an integral part of a complete examination. Photoelectric Plethysmograph (PPG) waveforms and toe systolic pressure readings are included as required and additional duplex testing as needed. Limited examinations for reoccurring indications may be performed as noted.  ABI Findings: +--------+------------------+-----+---------+--------+ Right   Rt Pressure (mmHg)IndexWaveform Comment  +--------+------------------+-----+---------+--------+ [id number]                    triphasic         +--------+------------------+-----+---------+--------+ +--------+------------------+-----+---------+-------+ Left    Lt Pressure (mmHg)IndexWaveform Comment +--------+------------------+-----+---------+-------+ [id number]                    triphasic        +--------+------------------+-----+---------+-------+ PTA      254               1.44 triphasic        +--------+------------------+-----+---------+-------+ DP      170  0.96 triphasic        +--------+------------------+-----+---------+-------+ +-------+-----------+-----------+------------+------------+ ABI/TBIToday's ABIToday's TBIPrevious ABIPrevious TBI +-------+-----------+-----------+------------+------------+ Right  AKA        AKA                                 +-------+-----------+-----------+------------+------------+ Left   0.96       N/A                                 +-------+-----------+-----------+------------+------------+ Limited exam due to right above the knee amputation, and left foot/toe deformation. Unable to obtain toe pressure or waveforms.  Summary: Right:  Above the knee amputation. Left: Resting left ankle-brachial index is within normal range.  Left foot/toe deformity, no toe pressure and waveforms obtained. *See table(s) above for measurements and observations.  Electronically signed by Penne Colorado MD on 02/25/2024 at 3:33:16 PM.    Final    DG Foot Complete Left Result Date: 02/23/2024 CLINICAL DATA:  Pain. EXAM: LEFT FOOT - COMPLETE 3+ VIEW COMPARISON:  12/13/2023. FINDINGS: There has been transmetatarsal resection of all vein the first and second ray. There has been amputation of the middle and distal phalanx of the third digit and head of the proximal phalanx of the third digit. Erosions are noted at the tufts of the distal phalanx of the fourth and fifth digits. There are new lucencies and cortical irregularities involving the head of the third metatarsal. No dislocation is seen. Diffuse soft tissue swelling is noted about the foot. Vascular calcifications are noted. IMPRESSION: 1. Lucencies and cortical irregularities involving the head of the third metatarsal which is new from the previous exam, possible osteomyelitis versus nondisplaced fracture. 2. Bony erosions along the tufts of the distal  phalanx of the fourth and fifth digits, possible osteomyelitis. 3. Postsurgical changes as described above. Electronically Signed   By: Leita Birmingham M.D.   On: 02/23/2024 17:18   DG Chest Port 1 View Result Date: 02/21/2024 CLINICAL DATA:  Altered mental status. EXAM: PORTABLE CHEST 1 VIEW COMPARISON:  December 24, 2023. FINDINGS: Stable cardiomediastinal silhouette. Mild central pulmonary vascular congestion is noted. No consolidative process is noted. Bony thorax is unremarkable. IMPRESSION: Mild central pulmonary vascular congestion. Electronically Signed   By: Lynwood Landy Raddle M.D.   On: 02/21/2024 10:17    Microbiology: Results for orders placed or performed during the hospital encounter of 02/21/24  Resp panel by RT-PCR (RSV, Flu A&B, Covid) Anterior Nasal Swab     Status: None   Collection Time: 02/21/24  9:04 AM   Specimen: Anterior Nasal Swab  Result Value Ref Range Status   SARS Coronavirus 2 by RT PCR NEGATIVE NEGATIVE Final   Influenza A by PCR NEGATIVE NEGATIVE Final   Influenza B by PCR NEGATIVE NEGATIVE Final    Comment: (NOTE) The Xpert Xpress SARS-CoV-2/FLU/RSV plus assay is intended as an aid in the diagnosis of influenza from Nasopharyngeal swab specimens and should not be used as a sole basis for treatment. Nasal washings and aspirates are unacceptable for Xpert Xpress SARS-CoV-2/FLU/RSV testing.  Fact Sheet for Patients: BloggerCourse.com  Fact Sheet for Healthcare Providers: SeriousBroker.it  This test is not yet approved or cleared by the [place]  FDA and has been authorized for detection and/or diagnosis of SARS-CoV-2 by FDA under an Emergency Use Authorization (EUA). This EUA will remain in effect (meaning this test can  be used) for the duration of the COVID-19 declaration under Section 564(b)(1) of the Act, 21 U.S.C. section 360bbb-3(b)(1), unless the authorization is terminated or revoked.     Resp  Syncytial Virus by PCR NEGATIVE NEGATIVE Final    Comment: (NOTE) Fact Sheet for Patients: BloggerCourse.com  Fact Sheet for Healthcare Providers: SeriousBroker.it  This test is not yet approved or cleared by the [place]  FDA and has been authorized for detection and/or diagnosis of SARS-CoV-2 by FDA under an Emergency Use Authorization (EUA). This EUA will remain in effect (meaning this test can be used) for the duration of the COVID-19 declaration under Section 564(b)(1) of the Act, 21 U.S.C. section 360bbb-3(b)(1), unless the authorization is terminated or revoked.  Performed at [facility], [address], [place], [place] [address]   Blood Culture (routine x 2)     Status: None   Collection Time: 02/21/24  9:34 AM   Specimen: BLOOD  Result Value Ref Range Status   Specimen Description BLOOD SITE NOT SPECIFIED  Final   Special Requests   Final    BOTTLES DRAWN AEROBIC AND ANAEROBIC Blood Culture adequate volume   Culture   Final    NO GROWTH 5 DAYS Performed at [facility], [address] [place], [place] [address]    Report Status 02/26/2024 FINAL  Final  Blood Culture (routine x 2)     Status: None   Collection Time: 02/21/24  9:34 AM   Specimen: BLOOD  Result Value Ref Range Status   Specimen Description BLOOD SITE NOT SPECIFIED  Final   Special Requests   Final    BOTTLES DRAWN AEROBIC AND ANAEROBIC Blood Culture adequate volume   Culture   Final    NO GROWTH 5 DAYS Performed at [facility], [address], [place], [place] [address]    Report Status 02/26/2024 FINAL  Final  Respiratory (~20 pathogens) panel by PCR     Status: None   Collection Time: 02/26/24  2:36 PM   Specimen: Nasopharyngeal Swab; Respiratory  Result Value Ref Range Status   Adenovirus NOT DETECTED NOT DETECTED Final   Coronavirus 229E NOT DETECTED NOT DETECTED Final    Comment: (NOTE) The Coronavirus on the  Respiratory Panel, DOES NOT test for the novel  Coronavirus (2019 nCoV)    Coronavirus HKU1 NOT DETECTED NOT DETECTED Final   Coronavirus NL63 NOT DETECTED NOT DETECTED Final   Coronavirus OC43 NOT DETECTED NOT DETECTED Final   Metapneumovirus NOT DETECTED NOT DETECTED Final   Rhinovirus / Enterovirus NOT DETECTED NOT DETECTED Final   Influenza A NOT DETECTED NOT DETECTED Final   Influenza B NOT DETECTED NOT DETECTED Final   Parainfluenza Virus 1 NOT DETECTED NOT DETECTED Final   Parainfluenza Virus 2 NOT DETECTED NOT DETECTED Final   Parainfluenza Virus 3 NOT DETECTED NOT DETECTED Final   Parainfluenza Virus 4 NOT DETECTED NOT DETECTED Final   Respiratory Syncytial Virus NOT DETECTED NOT DETECTED Final   Bordetella pertussis NOT DETECTED NOT DETECTED Final   Bordetella Parapertussis NOT DETECTED NOT DETECTED Final   Chlamydophila pneumoniae NOT DETECTED NOT DETECTED Final   Mycoplasma pneumoniae NOT DETECTED NOT DETECTED Final    Comment: Performed at [facility], [address] [place], [place] [address]    Labs: CBC: Recent Labs  Lab 02/24/24 0940 02/26/24 0950 02/27/24 0516  WBC 5.4 11.2* 9.8  NEUTROABS 3.1 10.2* 7.9*  HGB 9.3* 10.1* 9.1*  HCT 29.7* 32.2*  28.6*  MCV 92.5 91.0 91.7  PLT 149* 160 150   Basic Metabolic Panel: Recent Labs  Lab 02/24/24 0940 02/26/24 0950 02/27/24 0516  NA 143 140 137  K 4.1 4.1 3.7  CL 110 112* 106  CO2 21* 18* 17*  GLUCOSE 82 132* 132*  BUN 24* 32* 39*  CREATININE 1.85* 2.00* 2.69*  CALCIUM  8.7* 8.4* 8.1*   Liver Function Tests: No results for input(s): AST, ALT, ALKPHOS, BILITOT, PROT, ALBUMIN  in the last 168 hours. CBG: Recent Labs  Lab 02/29/24 1141 02/29/24 1756 02/29/24 2113 03/01/24 0741 03/01/24 1238  GLUCAP 154* 75 128* 108* 104*    Discharge time spent: 35 minutes.  Signed: Concepcion Riser, MD [facility] 03/01/2024

## 2024-02-28 NOTE — Plan of Care (Signed)
  Problem: Education: Goal: Knowledge of General Education information will improve Description: Including pain rating scale, medication(s)/side effects and non-pharmacologic comfort measures Outcome: Progressing   Problem: Nutrition: Goal: Adequate nutrition will be maintained Outcome: Progressing   Problem: Activity: Goal: Risk for activity intolerance will decrease Outcome: Progressing   Problem: Coping: Goal: Level of anxiety will decrease Outcome: Progressing   Problem: Elimination: Goal: Will not experience complications related to bowel motility Outcome: Progressing Goal: Will not experience complications related to urinary retention Outcome: Progressing

## 2024-02-28 NOTE — Congregational Nurse Program (Signed)
 Pt refused wound care and bath, but agreeable to bedding changes.

## 2024-02-28 NOTE — Discharge Summary (Incomplete Revision)
 Physician Discharge Summary   Patient: Gregory [name] MRN: [id number] DOB: 08-05-1960  Admit date:     02/21/2024  Discharge date: 02/28/24  Discharge Physician: Concepcion Riser   PCP: [name], Provider, MD   Recommendations at discharge:   PCP follow up in 1 week.  Repeat BMP in 1 week. Ortho follow up with Dr. Harden as scheduled.  Discharge Diagnoses: Principal Problem:   Sepsis (HCC) Active Problems:   Sepsis with acute renal failure without septic shock (HCC)   DM2 (diabetes mellitus, type 2) (HCC)   S/P AKA (above knee amputation) unilateral, right (HCC)   Chronic kidney disease, stage 3b (HCC)   High output ileostomy (HCC)   Acute encephalopathy   Acute hyperactive delirium due to another medical condition  Resolved Problems:   * No resolved hospital problems. *  Hospital Course: Gregory [name] is a 63 y.o. male with medical history significant of hypertension, hyperlipidemia, type 2 diabetes on insulin , cervical spinal stenosis, high output ileostomy, COPD, right AKA, chronic pain syndrome on multiple narcotics, stroke, and recent admission in 12/2023 for GBS bacteremia and encephalopathy presented with confusion/ encephalopathy and found to have sepsis 2/2 LLE cellulitis.   In the ED, pt hypertensive, tachycardic, and tachypneic w/o hypoxia. Febrile to 104. Labs notable for Cr 2.13, WBC 16.6, and lactic acid 2. EDP started IV abx and requested medicine admission for sepsis.    He ws found to have  possible Osteomyelitis in the third metatarsal head versus nondisplaced fracture. Bony erosions along the tufts of the distal phalanx of the fourth and fifth digits, possible osteomyelitis on x rays of the foot. Refused to get MRI due to claustrophobia. Orthopedics consulted. He has been on and off agitated and lethargic with medications.   Assessment and Plan: Sepsis due to left lower extremity cellulitis- Possible osteomyelitis of 3rd metatarsal, 4th, 5th distal phalanx  - Presented with fever, tachycardic, tachypneic, with leukocytosis and elevated lactic acid  He did receive 7 days IV antibiotics Zyvox , Zosyn .  CRP is 11.8, sed rate around 69.  Resp panel is negative. UA  unremarkable. Chest xray shows possible bronchitis. Blood cultures negative so far.  He has claustrophobia and does not want to get MRI,  CT scan left leg, foot reviewed. Left ABI within normal range. Sepsis physiology improved. His left leg, foot selling redness improved. Patient does not want any ortho intervention and refuses to stay in hospital for consultation.  I advised 7 days of Augmentin . PCP follow up and ortho follow up advised.    Acute toxic metabolic encephalopathy- In the setting of sepsis, multiple opiate medications, psychiatry medications. Psychiatry follow up appreciated. Continue seroquel  at night. Continue antibiotics for infection. Avoid opiates if possible. Delirium precautions.  Acute on CKD stage 3B- Avoid nephrotoxic drugs. PCP follow up for repeat BMP in 1 week.  Chronic anemia- stable Hb.   Chronic pain syndrome- Prescribed Oxycodone  IR dose decreased to 5mg  q6hr PRN, Oxy IR 15mg  Q12 hr.   COPD- O2 weaned off. He is on room air. Continue duonebs PRN.   S/p ileostomy- Continue nursing care for ileostomy.     {Tip this will not be part of the note when signed Body mass index is 24.7 kg/m. , ,  (Optional):26781}  Pain control - [place]  Controlled Substance Reporting System database was reviewed. and patient was instructed, not to drive, operate heavy machinery, perform activities at heights, swimming or participation in water  activities or provide baby-sitting services while on Pain, Sleep  and Anxiety Medications; until their outpatient Physician has advised to do so again. Also recommended to not to take more than prescribed Pain, Sleep and Anxiety Medications.  Consultants: psychiatry Procedures performed: none  Disposition: Skilled  nursing facility Diet recommendation:  Discharge Diet Orders (From admission, onward)     Start     Ordered   02/28/24 0000  Diet - low sodium heart healthy        02/28/24 1032           Cardiac and Carb modified diet DISCHARGE MEDICATION: Allergies as of 02/28/2024       Reactions   Latex Other (See Comments)   Allergic, per [facility]        Medication List     TAKE these medications    acetaminophen  325 MG tablet Commonly known as: TYLENOL  Take 2 tablets (650 mg total) by mouth every 6 (six) hours as needed for mild pain (pain score 1-3) (or Fever >/= 101).   albuterol  (2.5 MG/3ML) 0.083% nebulizer solution Commonly known as: PROVENTIL  Take 2.5 mg by nebulization every 6 (six) hours as needed for wheezing or shortness of breath.   albuterol  108 (90 Base) MCG/ACT inhaler Commonly known as: VENTOLIN  HFA Inhale 2 puffs into the lungs every 6 (six) hours as needed for wheezing or shortness of breath.   amoxicillin -clavulanate 875-125 MG tablet Commonly known as: AUGMENTIN  Take 1 tablet by mouth 2 (two) times daily for 7 days.   aspirin  EC 81 MG tablet Take 81 mg by mouth daily.   atorvastatin  80 MG tablet Commonly known as: LIPITOR  Take 1 tablet (80 mg total) by mouth daily.   busPIRone  10 MG tablet Commonly known as: BUSPAR  Take 10 mg by mouth 3 (three) times daily.   Calcium  Carbonate Antacid 648 MG Tabs Take 2 tablets by mouth every 8 (eight) hours as needed (Indigestion).   Cholecalciferol  25 MCG (1000 UT) capsule Take 2,000 Units by mouth daily.   docusate sodium  100 MG capsule Commonly known as: COLACE Take 100 mg by mouth every 12 (twelve) hours.   doxepin  25 MG capsule Commonly known as: SINEQUAN  Take 50 mg by mouth at bedtime.   ferrous sulfate  325 (65 FE) MG tablet Take 1 tablet (325 mg total) by mouth 3 (three) times daily with meals.   hydrOXYzine  25 MG tablet Commonly known as: ATARAX  Take 25 mg by mouth every 6 (six) hours as needed  for anxiety.   insulin  lispro 100 UNIT/ML KwikPen Commonly known as: HUMALOG  Inject 0-10 Units into the skin See admin instructions. Inject 0-10 units into the skin three times a day and at bedtime, PER SLIDING SCALE:  BGL 151-200 = 2 units 201-250 = 4 units 251-300 = 6 units 301-350 =  8 units 351-400 = 10 units   Journavx  50 MG Tabs Generic drug: Suzetrigine  Take 1 tablet by mouth 2 (two) times daily.   lactulose  10 GM/15ML solution Commonly known as: CHRONULAC  Take 30 mLs (20 g total) by mouth 2 (two) times daily.   levETIRAcetam  500 MG tablet Commonly known as: Keppra  Take 1 tablet (500 mg total) by mouth 2 (two) times daily.   lidocaine  4 % Place 1 patch onto the skin daily. Apply 1 new patch to the affected area and remove 12 hours later   magnesium  oxide 400 (240 Mg) MG tablet Commonly known as: MAG-OX Take 400 mg by mouth 3 (three) times daily.   Melatonin 10 MG Tabs Take 10 mg by mouth at bedtime.  methocarbamol  500 MG tablet Commonly known as: ROBAXIN  Take 500 mg by mouth every 6 (six) hours as needed for muscle spasms.   multivitamin with minerals tablet Take 1 tablet by mouth daily.   Narcan  4 MG/0.1ML Liqd nasal spray kit Generic drug: naloxone  Place 1 spray into the nose See admin instructions. Instill 1 spray into alternating nostrils every 5 minutes as needed for an opiate overdose   naloxone  0.4 MG/ML injection Commonly known as: NARCAN  Inject 0.4 mg into the muscle as needed (for oversedation).   ondansetron  4 MG tablet Commonly known as: ZOFRAN  Take 4 mg by mouth every 6 (six) hours as needed for nausea or vomiting.   OXcarbazepine  150 MG tablet Commonly known as: TRILEPTAL  Take 1 tablet (150 mg total) by mouth 2 (two) times daily.   oxyCODONE  15 mg 12 hr tablet Commonly known as: OXYCONTIN  Take 1 tablet (15 mg total) by mouth every 12 (twelve) hours for 5 days. What changed:  medication strength how much to take when to take this    oxyCODONE  5 MG immediate release tablet Commonly known as: Oxy IR/ROXICODONE  Take 1 tablet (5 mg total) by mouth every 6 (six) hours as needed for up to 5 days for severe pain (pain score 7-10) or breakthrough pain. What changed: reasons to take this   pregabalin  200 MG capsule Commonly known as: LYRICA  Take 200 mg by mouth 2 (two) times daily.   QUEtiapine  25 MG tablet Commonly known as: SEROQUEL  Take 1 tablet (25 mg total) by mouth at bedtime and may repeat dose one time if needed.   senna 8.6 MG Tabs tablet Commonly known as: SENOKOT Take 2 tablets by mouth at bedtime.   sodium bicarbonate  650 MG tablet Take 650 mg by mouth 2 (two) times daily.   tamsulosin  0.4 MG Caps capsule Commonly known as: FLOMAX  Take 1 capsule (0.4 mg total) by mouth daily.   Uro-MP 118 MG Caps Take 118 mg by mouth 2 (two) times daily.               Discharge Care Instructions  (From admission, onward)           Start     Ordered   02/28/24 0000  Discharge wound care:       Comments: Cleanse with warm water , pat dry. Apply Desitin to the groin once a day or PRN. Peri-anal wounds: Cleanse with warm water , pat dry. Apply Medihoney daily to the wound bed, cover with foam dressing, change every 3 days or PRN.   02/28/24 1032            Contact information for after-discharge care     Destination     [facility] .   Service: Skilled Nursing Contact information: [address] [place]  [address] [phone]                    Discharge Exam: Fredricka Weights   02/21/24 0905 02/25/24 2135  Weight: 111.1 kg 82.6 kg      02/28/2024   12:04 PM 02/28/2024    8:15 AM 02/28/2024    5:42 AM  Vitals with BMI  Systolic 110 110 890  Diastolic 63 60 54  Pulse 59 58 62   General - Elderly Caucasian ill male, no respiratory distress HEENT - PERRLA, EOMI, atraumatic head, non tender sinuses. Lung - Clear, basal rales, wheezes. Heart - S1, S2 heard, no  murmurs, rubs, left foot pedal edema. Abdomen - Soft, non  tender, ileostomy intact. Neuro - Alert, awake and oriented, non focal exam. Skin - Right AKA, left 1st, 2nd, 5th toe amputations. Left foot, leg redness improved.  Condition at discharge: stable  The results of significant diagnostics from this hospitalization (including imaging, microbiology, ancillary and laboratory) are listed below for reference.   Imaging Studies: CT FOOT LEFT WO CONTRAST Result Date: 02/26/2024 CLINICAL DATA:  Soft tissue infection and possible osteomyelitis EXAM: CT OF THE LEFT FOOT WITHOUT CONTRAST TECHNIQUE: Multidetector CT imaging of the left foot was performed according to the standard protocol. Multiplanar CT image reconstructions were also generated. RADIATION DOSE REDUCTION: This exam was performed according to the departmental dose-optimization program which includes automated exposure control, adjustment of the mA and/or kV according to patient size and/or use of iterative reconstruction technique. COMPARISON:  Radiographs 12/13/2023 FINDINGS: Bones/Joint/Cartilage Prior amputations in the foot including first digit mid to distal metatarsal level amputation; second digit distal metatarsal metadiaphysis amputation; and third digit amputation at the distal metaphysis of the proximal phalanx. First digit sesamoids are still present. The fused mid and distal phalanges of the small toe as shown on 06/03/2018 are substantially eroded distally with the combined length only 0.5 cm as compared to 1.8 cm on 06/03/2018. This could be from a prior amputation or from active erosion and osteomyelitis. Correlate with any history of fifth toe amputation. The soft tissue mantle overlying the tip of the proximal phalanx fourth toe is only about 3 mm thick. Ligaments Suboptimally assessed by CT. Muscles and Tendons Mildly thickened distal Achilles tendon may reflect mild Achilles tendinopathy. Diffuse muscular atrophy in the foot.  Soft tissues Notable subcutaneous edema circumferentially at the ankle and tracking in the dorsum of the foot and into the toes. No drainable abscess seen. IMPRESSION: 1. The fused mid and distal phalanges of the small toe as shown on 06/03/2018 are substantially eroded distally with the combined length only 0.5 cm as compared to 1.8 cm on 06/03/2018. This could be from a prior amputation or from active erosion and osteomyelitis. Correlate with any history of fifth toe amputation. 2. Prior amputations in the foot including first digit mid to distal metatarsal level amputation; second digit distal metatarsal metadiaphysis amputation; and third digit amputation at the distal metaphysis of the proximal phalanx. 3. Notable subcutaneous edema circumferentially at the ankle and tracking in the dorsum of the foot and into the toes. No drainable abscess seen. 4. Mildly thickened distal Achilles tendon may reflect mild Achilles tendinopathy. 5. Diffuse muscular atrophy in the foot. Electronically Signed   By: Ryan Salvage M.D.   On: 02/26/2024 16:39   CT TIBIA FIBULA LEFT WO CONTRAST Result Date: 02/26/2024 CLINICAL DATA:  Soft tissue infection in the lower leg/foot EXAM: CT OF THE LOWER LEFT EXTREMITY WITHOUT CONTRAST TECHNIQUE: Multidetector CT imaging of the left calf was performed according to the standard protocol. RADIATION DOSE REDUCTION: This exam was performed according to the departmental dose-optimization program which includes automated exposure control, adjustment of the mA and/or kV according to patient size and/or use of iterative reconstruction technique. COMPARISON:  Radiographs 09/18/2022 FINDINGS: Bones/Joint/Cartilage Mild osteoarthritis of the knee consisting primarily of medial compartmental articular space narrowing with some marginal spurring in the medial compartment and patellofemoral joint. Trace knee effusion. No bony destructive findings in the tibia or fibula to indicate osteomyelitis.  Ligaments Suboptimally assessed by CT. Muscles and Tendons Severe muscular atrophy in the calf and involving the distal semimembranosus muscle. Soft tissues Subcutaneous in the calf circumferentially distally  and tracking into the dorsum of the ankle. More cephalad the edema is primarily anterior in location. This edema appears infiltrative and is associated with some cutaneous thickening surface irregularity. No gas identified tracking in the soft tissues. No drainable process identified in the calf. Arterial atherosclerotic calcifications. IMPRESSION: 1. Subcutaneous edema in the calf circumferentially distally and tracking into the dorsum of the ankle. This edema appears infiltrative and is associated with some cutaneous thickening surface irregularity. No gas identified tracking in the soft tissues. No drainable process identified in the calf. 2. Severe muscular atrophy in the calf and involving the distal semimembranosus muscle. 3. Mild osteoarthritis of the knee. 4. Trace knee effusion. 5. Arterial atherosclerotic calcifications. Electronically Signed   By: Ryan Salvage M.D.   On: 02/26/2024 16:32   DG CHEST PORT 1 VIEW Result Date: 02/26/2024 CLINICAL DATA:  Hypoxia EXAM: PORTABLE CHEST 1 VIEW COMPARISON:  02/21/2024 FINDINGS: Atherosclerotic calcification of the aortic arch. Heart size within normal limits. Airway thickening noted. Mild interstitial accentuation, as can commonly be encountered in smokers. No discrete airspace opacity is identified. Postoperative findings in the cervical spine. IMPRESSION: 1. Airway thickening is present, suggesting bronchitis or reactive airways disease. 2. Mild interstitial accentuation, as can commonly be encountered in smokers. 3. Aortic Atherosclerosis (ICD10-I70.0). Electronically Signed   By: Ryan Salvage M.D.   On: 02/26/2024 15:56   VAS US  ABI WITH/WO TBI Result Date: 02/25/2024  LOWER EXTREMITY DOPPLER STUDY Patient Name:  RAEGAN SIPP  Date of Exam:    02/24/2024 Medical Rec #: [id number]     Accession #:    [id number] Date of Birth: October 12, 1960    Patient Gender: M Patient Age:   27 years Exam Location:  [facility] Procedure:      VAS US  ABI WITH/WO TBI Referring Phys: EMMA COLLINS --------------------------------------------------------------------------------  Indications: Ischemic lower extremity. right AKA High Risk Factors: Hypertension, hyperlipidemia, Diabetes, current smoker. Other Factors: Patient unable to provide history.  Limitations: Today's exam was limited due to patient unable to lie flat, patient              intolerant to cuff pressure and involuntary patient movement. Comparison Study: Previous study on 10.9.2018. Performing Technologist: Edilia Elden Appl  Examination Guidelines: A complete evaluation includes at minimum, Doppler waveform signals and systolic blood pressure reading at the level of bilateral brachial, anterior tibial, and posterior tibial arteries, when vessel segments are accessible. Bilateral testing is considered an integral part of a complete examination. Photoelectric Plethysmograph (PPG) waveforms and toe systolic pressure readings are included as required and additional duplex testing as needed. Limited examinations for reoccurring indications may be performed as noted.  ABI Findings: +--------+------------------+-----+---------+--------+ Right   Rt Pressure (mmHg)IndexWaveform Comment  +--------+------------------+-----+---------+--------+ [id number]                    triphasic         +--------+------------------+-----+---------+--------+ +--------+------------------+-----+---------+-------+ Left    Lt Pressure (mmHg)IndexWaveform Comment +--------+------------------+-----+---------+-------+ [id number]                    triphasic        +--------+------------------+-----+---------+-------+ PTA     254               1.44 triphasic         +--------+------------------+-----+---------+-------+ DP      170               0.96 triphasic        +--------+------------------+-----+---------+-------+ +-------+-----------+-----------+------------+------------+  ABI/TBIToday's ABIToday's TBIPrevious ABIPrevious TBI +-------+-----------+-----------+------------+------------+ Right  AKA        AKA                                 +-------+-----------+-----------+------------+------------+ Left   0.96       N/A                                 +-------+-----------+-----------+------------+------------+ Limited exam due to right above the knee amputation, and left foot/toe deformation. Unable to obtain toe pressure or waveforms.  Summary: Right:  Above the knee amputation. Left: Resting left ankle-brachial index is within normal range.  Left foot/toe deformity, no toe pressure and waveforms obtained. *See table(s) above for measurements and observations.  Electronically signed by Penne Colorado MD on 02/25/2024 at 3:33:16 PM.    Final    DG Foot Complete Left Result Date: 02/23/2024 CLINICAL DATA:  Pain. EXAM: LEFT FOOT - COMPLETE 3+ VIEW COMPARISON:  12/13/2023. FINDINGS: There has been transmetatarsal resection of all vein the first and second ray. There has been amputation of the middle and distal phalanx of the third digit and head of the proximal phalanx of the third digit. Erosions are noted at the tufts of the distal phalanx of the fourth and fifth digits. There are new lucencies and cortical irregularities involving the head of the third metatarsal. No dislocation is seen. Diffuse soft tissue swelling is noted about the foot. Vascular calcifications are noted. IMPRESSION: 1. Lucencies and cortical irregularities involving the head of the third metatarsal which is new from the previous exam, possible osteomyelitis versus nondisplaced fracture. 2. Bony erosions along the tufts of the distal phalanx of the fourth and fifth digits, possible  osteomyelitis. 3. Postsurgical changes as described above. Electronically Signed   By: Leita Birmingham M.D.   On: 02/23/2024 17:18   DG Chest Port 1 View Result Date: 02/21/2024 CLINICAL DATA:  Altered mental status. EXAM: PORTABLE CHEST 1 VIEW COMPARISON:  December 24, 2023. FINDINGS: Stable cardiomediastinal silhouette. Mild central pulmonary vascular congestion is noted. No consolidative process is noted. Bony thorax is unremarkable. IMPRESSION: Mild central pulmonary vascular congestion. Electronically Signed   By: Lynwood Landy Raddle M.D.   On: 02/21/2024 10:17    Microbiology: Results for orders placed or performed during the hospital encounter of 02/21/24  Resp panel by RT-PCR (RSV, Flu A&B, Covid) Anterior Nasal Swab     Status: None   Collection Time: 02/21/24  9:04 AM   Specimen: Anterior Nasal Swab  Result Value Ref Range Status   SARS Coronavirus 2 by RT PCR NEGATIVE NEGATIVE Final   Influenza A by PCR NEGATIVE NEGATIVE Final   Influenza B by PCR NEGATIVE NEGATIVE Final    Comment: (NOTE) The Xpert Xpress SARS-CoV-2/FLU/RSV plus assay is intended as an aid in the diagnosis of influenza from Nasopharyngeal swab specimens and should not be used as a sole basis for treatment. Nasal washings and aspirates are unacceptable for Xpert Xpress SARS-CoV-2/FLU/RSV testing.  Fact Sheet for Patients: BloggerCourse.com  Fact Sheet for Healthcare Providers: SeriousBroker.it  This test is not yet approved or cleared by the [place]  FDA and has been authorized for detection and/or diagnosis of SARS-CoV-2 by FDA under an Emergency Use Authorization (EUA). This EUA will remain in effect (meaning this test can be used) for the duration of the COVID-19 declaration under Section  564(b)(1) of the Act, 21 U.S.C. section 360bbb-3(b)(1), unless the authorization is terminated or revoked.     Resp Syncytial Virus by PCR NEGATIVE NEGATIVE Final     Comment: (NOTE) Fact Sheet for Patients: BloggerCourse.com  Fact Sheet for Healthcare Providers: SeriousBroker.it  This test is not yet approved or cleared by the [place]  FDA and has been authorized for detection and/or diagnosis of SARS-CoV-2 by FDA under an Emergency Use Authorization (EUA). This EUA will remain in effect (meaning this test can be used) for the duration of the COVID-19 declaration under Section 564(b)(1) of the Act, 21 U.S.C. section 360bbb-3(b)(1), unless the authorization is terminated or revoked.  Performed at [facility], [address] [place], [place] [address]   Blood Culture (routine x 2)     Status: None   Collection Time: 02/21/24  9:34 AM   Specimen: BLOOD  Result Value Ref Range Status   Specimen Description BLOOD SITE NOT SPECIFIED  Final   Special Requests   Final    BOTTLES DRAWN AEROBIC AND ANAEROBIC Blood Culture adequate volume   Culture   Final    NO GROWTH 5 DAYS Performed at [facility], [address], [place], [place] [address]    Report Status 02/26/2024 FINAL  Final  Blood Culture (routine x 2)     Status: None   Collection Time: 02/21/24  9:34 AM   Specimen: BLOOD  Result Value Ref Range Status   Specimen Description BLOOD SITE NOT SPECIFIED  Final   Special Requests   Final    BOTTLES DRAWN AEROBIC AND ANAEROBIC Blood Culture adequate volume   Culture   Final    NO GROWTH 5 DAYS Performed at [facility], [address] [place], [place] [address]    Report Status 02/26/2024 FINAL  Final  Respiratory (~20 pathogens) panel by PCR     Status: None   Collection Time: 02/26/24  2:36 PM   Specimen: Nasopharyngeal Swab; Respiratory  Result Value Ref Range Status   Adenovirus NOT DETECTED NOT DETECTED Final   Coronavirus 229E NOT DETECTED NOT DETECTED Final    Comment: (NOTE) The Coronavirus on the Respiratory Panel, DOES NOT test for the novel   Coronavirus (2019 nCoV)    Coronavirus HKU1 NOT DETECTED NOT DETECTED Final   Coronavirus NL63 NOT DETECTED NOT DETECTED Final   Coronavirus OC43 NOT DETECTED NOT DETECTED Final   Metapneumovirus NOT DETECTED NOT DETECTED Final   Rhinovirus / Enterovirus NOT DETECTED NOT DETECTED Final   Influenza A NOT DETECTED NOT DETECTED Final   Influenza B NOT DETECTED NOT DETECTED Final   Parainfluenza Virus 1 NOT DETECTED NOT DETECTED Final   Parainfluenza Virus 2 NOT DETECTED NOT DETECTED Final   Parainfluenza Virus 3 NOT DETECTED NOT DETECTED Final   Parainfluenza Virus 4 NOT DETECTED NOT DETECTED Final   Respiratory Syncytial Virus NOT DETECTED NOT DETECTED Final   Bordetella pertussis NOT DETECTED NOT DETECTED Final   Bordetella Parapertussis NOT DETECTED NOT DETECTED Final   Chlamydophila pneumoniae NOT DETECTED NOT DETECTED Final   Mycoplasma pneumoniae NOT DETECTED NOT DETECTED Final    Comment: Performed at [facility] Lab, [address], [place], [place] [address]    Labs: CBC: Recent Labs  Lab 02/21/24 1740 02/22/24 0508 02/24/24 0940 02/26/24 0950 02/27/24 0516  WBC 18.4* 11.6* 5.4 11.2* 9.8  NEUTROABS  --   --  3.1 10.2* 7.9*  HGB 11.1* 9.6* 9.3* 10.1* 9.1*  HCT 34.9* 30.8* 29.7* 32.2* 28.6*  MCV 92.1 91.9 92.5 91.0 91.7  PLT 198 162 149* 160 150   Basic Metabolic Panel: Recent Labs  Lab 02/21/24 1740 02/22/24 0508 02/24/24 0940 02/26/24 0950 02/27/24 0516  NA  --  141 143 140 137  K  --  4.4 4.1 4.1 3.7  CL  --  108 110 112* 106  CO2  --  21* 21* 18* 17*  GLUCOSE  --  99 82 132* 132*  BUN  --  25* 24* 32* 39*  CREATININE 1.99* 1.99* 1.85* 2.00* 2.69*  CALCIUM   --  8.7* 8.7* 8.4* 8.1*   Liver Function Tests: No results for input(s): AST, ALT, ALKPHOS, BILITOT, PROT, ALBUMIN  in the last 168 hours. CBG: Recent Labs  Lab 02/27/24 0752 02/27/24 1155 02/27/24 1705 02/27/24 2012 02/28/24 0800  GLUCAP 121* 100* 143* 112* 193*     Discharge time spent: 35 minutes.  Signed: Concepcion Riser, MD [facility] 02/28/2024

## 2024-02-29 DIAGNOSIS — G894 Chronic pain syndrome: Secondary | ICD-10-CM | POA: Diagnosis not present

## 2024-02-29 DIAGNOSIS — A419 Sepsis, unspecified organism: Secondary | ICD-10-CM | POA: Diagnosis not present

## 2024-02-29 DIAGNOSIS — J439 Emphysema, unspecified: Secondary | ICD-10-CM | POA: Diagnosis not present

## 2024-02-29 DIAGNOSIS — F05 Delirium due to known physiological condition: Secondary | ICD-10-CM | POA: Diagnosis not present

## 2024-02-29 LAB — GLUCOSE, CAPILLARY
Glucose-Capillary: 117 mg/dL — ABNORMAL HIGH (ref 70–99)
Glucose-Capillary: 154 mg/dL — ABNORMAL HIGH (ref 70–99)
Glucose-Capillary: 75 mg/dL (ref 70–99)
Glucose-Capillary: 128 mg/dL — ABNORMAL HIGH (ref 70–99)

## 2024-02-29 MED ORDER — ENOXAPARIN SODIUM 40 MG/0.4ML IJ SOSY
40.0000 mg | PREFILLED_SYRINGE | INTRAMUSCULAR | Status: DC
  Administered 2024-02-29: 40 mg via SUBCUTANEOUS
  Filled 2024-02-29: qty 0.4

## 2024-02-29 NOTE — Progress Notes (Signed)
 Progress Note   Patient: Gregory [name] FMW:[id number] DOB: 1960/06/29 DOA: 02/21/2024     8 DOS: the patient was seen and examined on 02/29/2024   Brief hospital course: Gregory [name] is a 63 y.o. male with medical history significant of hypertension, hyperlipidemia, type 2 diabetes on insulin , cervical spinal stenosis, high output ileostomy, COPD, right AKA, chronic pain syndrome on multiple narcotics, stroke, and recent admission in 12/2023 for GBS bacteremia and encephalopathy presented with confusion/ encephalopathy and found to have sepsis 2/2 LLE cellulitis.   In the ED, pt hypertensive, tachycardic, and tachypneic w/o hypoxia. Febrile to 104. Labs notable for Cr 2.13, WBC 16.6, and lactic acid 2. EDP started IV abx and requested medicine admission for sepsis.    He ws found to have  possible Osteomyelitis in the third metatarsal head versus nondisplaced fracture. Bony erosions along the tufts of the distal phalanx of the fourth and fifth digits, possible osteomyelitis on x rays of the foot. Refused to get MRI due to claustrophobia. Orthopedics consulted. He has been on and off agitated and lethargic with medications.  Assessment and Plan: Sepsis due to left lower extremity cellulitis- Possible osteomyelitis of 3rd metatarsal, 4th, 5th distal phalanx - Presented with fever, tachycardic, tachypneic, with leukocytosis and elevated lactic acid  Continue broad spectrum IV antibiotics Zyvox , Zosyn .  Sepsis physiology improving.  CRP is 11.8, sed rate around 69. Resp panel is negative. UA  unremarkable. Chest xray shows possible bronchitis. Blood cultures negative so far.  He has claustrophobia and does not want to get MRI,  CT scan left leg, foot reviewed. Left ABI within normal range. Ortho consult pending. Sepsis physiology improved. His left leg, foot selling redness improved. Patient does not want any ortho intervention and refuses to stay in hospital for consultation.  I advised 7 days of  Augmentin . PCP follow up and ortho follow up advised.   Acute toxic metabolic encephalopathy- In the setting of sepsis, multiple opiate medications, psychiatry medications. His mental status much improved.  Psychiatry follow up appreciated.  Continue antibiotics for infection. Avoid opiates if possible. Delirium precautions.  Chronic pain syndrome- Oxycodone  IR dose decreased to 5mg  q4hr PRN. Avoid opiates if he is more sleepy.  COPD- Does have wheezes, today requiring 5L o2. ABG showed low PO2.  Wean O2 gradually. Continue duonebs PRN.  Hypertension- BP lower side. Caution with antihypertensives, opiates.       Out of bed to chair. Incentive spirometry. Nursing supportive care. Fall, aspiration precautions. Diet:  Diet Orders (From admission, onward)     Start     Ordered   02/21/24 1729  Diet Carb Modified Fluid consistency: Thin; Room service appropriate? Yes  Diet effective now       Question Answer Comment  Diet-HS Snack? Nothing   Calorie Level Medium 1600-2000   Fluid consistency: Thin   Room service appropriate? Yes      02/21/24 1728           DVT prophylaxis: enoxaparin  (LOVENOX ) injection 40 mg Start: 02/29/24 1800  Level of care: Med-Surg   Code Status: Full Code  Subjective: Patient is seen and examined today morning. He is more alert today, wishes to go back to facility. No complaints, asked about his discharge pain meds.  Physical Exam: Vitals:   02/29/24 0348 02/29/24 0745 02/29/24 1248 02/29/24 1430  BP: (!) 110/59 131/76 127/61   Pulse: (!) 54 (!) 53 (!) 52   Resp: 16 19 18    Temp: 97.8 F (  36.6 C) 98.7 F (37.1 C) 98.8 F (37.1 C)   TempSrc:      SpO2: 96%     Weight:    85 kg  Height:    6' (1.829 m)    General - Elderly Caucasian ill male, no respiratory distress HEENT - PERRLA, EOMI, atraumatic head, non tender sinuses. Lung - Clear, basal rales, wheezes. Heart - S1, S2 heard, no murmurs, rubs, left foot pedal  edema. Abdomen - Soft, non tender, bowel sounds good Neuro - Alert, awake and oriented, non focal exam. Skin - Warm and dry. 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  Data Reviewed:      Latest Ref Rng & Units 02/27/2024    5:16 AM 02/26/2024    9:50 AM 02/24/2024    9:40 AM  CBC  WBC 4.0 - 10.5 K/uL 9.8  11.2  5.4   Hemoglobin 13.0 - 17.0 g/dL 9.1  89.8  9.3   Hematocrit 39.0 - 52.0 % 28.6  32.2  29.7   Platelets 150 - 400 K/uL 150  160  149       Latest Ref Rng & Units 02/27/2024    5:16 AM 02/26/2024    9:50 AM 02/24/2024    9:40 AM  BMP  Glucose 70 - 99 mg/dL 867  867  82   BUN 8 - 23 mg/dL 39  32  24   Creatinine 0.61 - 1.24 mg/dL 7.30  7.99  8.14   Sodium 135 - 145 mmol/L 137  140  143   Potassium 3.5 - 5.1 mmol/L 3.7  4.1  4.1   Chloride 98 - 111 mmol/L 106  112  110   CO2 22 - 32 mmol/L 17  18  21    Calcium  8.9 - 10.3 mg/dL 8.1  8.4  8.7    No results found.   Family Communication: Discussed with patient, he understand and agree. All questions answered.  Disposition: Status is: Inpatient Remains inpatient appropriate because: awaiting facility bed availability.  Planned Discharge Destination: Home with Home Health     Time spent: 44 minutes  Author: Concepcion Riser, MD 02/29/2024 2:50 PM Secure chat 7am to 7pm For on call review [email or web address].

## 2024-02-29 NOTE — Plan of Care (Signed)
  Problem: Safety: Goal: Non-violent Restraint(s) Outcome: Progressing   Problem: Education: Goal: Knowledge of General Education information will improve Description: Including pain rating scale, medication(s)/side effects and non-pharmacologic comfort measures Outcome: Progressing   Problem: Health Behavior/Discharge Planning: Goal: Ability to manage health-related needs will improve Outcome: Progressing   Problem: Clinical Measurements: Goal: Ability to maintain clinical measurements within normal limits will improve Outcome: Progressing Goal: Will remain free from infection Outcome: Progressing Goal: Diagnostic test results will improve Outcome: Progressing Goal: Respiratory complications will improve Outcome: Progressing Goal: Cardiovascular complication will be avoided Outcome: Progressing   Problem: Activity: Goal: Risk for activity intolerance will decrease Outcome: Progressing   Problem: Nutrition: Goal: Adequate nutrition will be maintained Outcome: Progressing   Problem: Coping: Goal: Level of anxiety will decrease Outcome: Progressing   Problem: Elimination: Goal: Will not experience complications related to bowel motility Outcome: Progressing Goal: Will not experience complications related to urinary retention Outcome: Progressing   Problem: Pain Managment: Goal: General experience of comfort will improve and/or be controlled Outcome: Progressing   Problem: Safety: Goal: Ability to remain free from injury will improve Outcome: Progressing   Problem: Skin Integrity: Goal: Risk for impaired skin integrity will decrease Outcome: Progressing   Problem: Education: Goal: Ability to describe self-care measures that may prevent or decrease complications (Diabetes Survival Skills Education) will improve Outcome: Progressing Goal: Individualized Educational Video(s) Outcome: Progressing   Problem: Coping: Goal: Ability to adjust to condition or change  in health will improve Outcome: Progressing   Problem: Fluid Volume: Goal: Ability to maintain a balanced intake and output will improve Outcome: Progressing   Problem: Health Behavior/Discharge Planning: Goal: Ability to identify and utilize available resources and services will improve Outcome: Progressing Goal: Ability to manage health-related needs will improve Outcome: Progressing   Problem: Metabolic: Goal: Ability to maintain appropriate glucose levels will improve Outcome: Progressing   Problem: Nutritional: Goal: Maintenance of adequate nutrition will improve Outcome: Progressing Goal: Progress toward achieving an optimal weight will improve Outcome: Progressing   Problem: Skin Integrity: Goal: Risk for impaired skin integrity will decrease Outcome: Progressing   Problem: Tissue Perfusion: Goal: Adequacy of tissue perfusion will improve Outcome: Progressing

## 2024-02-29 NOTE — Progress Notes (Signed)
 Progress Note   Patient: Gregory [name] FMW:[id number] DOB: 10-13-1960 DOA: 02/21/2024     8 DOS: the patient was seen and examined on 02/29/2024   Brief hospital course: Gregory [name] is a 63 y.o. male with medical history significant of hypertension, hyperlipidemia, type 2 diabetes on insulin , cervical spinal stenosis, high output ileostomy, COPD, right AKA, chronic pain syndrome on multiple narcotics, stroke, and recent admission in 12/2023 for GBS bacteremia and encephalopathy presented with confusion/ encephalopathy and found to have sepsis 2/2 LLE cellulitis.   In the ED, pt hypertensive, tachycardic, and tachypneic w/o hypoxia. Febrile to 104. Labs notable for Cr 2.13, WBC 16.6, and lactic acid 2. EDP started IV abx and requested medicine admission for sepsis.    He ws found to have  possible Osteomyelitis in the third metatarsal head versus nondisplaced fracture. Bony erosions along the tufts of the distal phalanx of the fourth and fifth digits, possible osteomyelitis on x rays of the foot. Refused to get MRI due to claustrophobia. Orthopedics consulted. He has been on and off agitated and lethargic with medications.  Assessment and Plan: Sepsis due to left lower extremity cellulitis- Possible osteomyelitis of 3rd metatarsal, 4th, 5th distal phalanx - Presented with fever, tachycardic, tachypneic, with leukocytosis and elevated lactic acid  Continue broad spectrum IV antibiotics Zyvox , Zosyn .  Sepsis physiology improving.  CRP is 11.8, sed rate around 69. Resp panel is negative. UA  unremarkable. Chest xray shows possible bronchitis. Blood cultures negative so far.  He has claustrophobia and does not want to get MRI,  CT scan left leg, foot reviewed. Left ABI within normal range. Ortho consult pending. Sepsis physiology improved. His left leg, foot selling redness improved. Patient does not want any ortho intervention and refuses to stay in hospital for consultation.  I advised 7 days of  Augmentin . PCP follow up and ortho follow up advised.   Acute toxic metabolic encephalopathy- In the setting of sepsis, multiple opiate medications, psychiatry medications. His mental status much improved.  Psychiatry follow up appreciated.  Continue antibiotics for infection. Avoid opiates if possible. Delirium precautions.  Chronic pain syndrome- Oxycodone  IR dose decreased to 5mg  q4hr PRN. Avoid opiates if he is more sleepy.  COPD- Does have wheezes, today requiring 5L o2. ABG showed low PO2.  Wean O2 gradually. Continue duonebs PRN.  Hypertension- BP lower side. Caution with antihypertensives, opiates.       Out of bed to chair. Incentive spirometry. Nursing supportive care. Fall, aspiration precautions. Diet:  Diet Orders (From admission, onward)     Start     Ordered   02/21/24 1729  Diet Carb Modified Fluid consistency: Thin; Room service appropriate? Yes  Diet effective now       Question Answer Comment  Diet-HS Snack? Nothing   Calorie Level Medium 1600-2000   Fluid consistency: Thin   Room service appropriate? Yes      02/21/24 1728           DVT prophylaxis:   Level of care: Med-Surg   Code Status: Full Code  Subjective: Patient is seen and examined today morning. He is more alert today, wishes to go back to facility. No complaints, asked about his discharge pain meds.  Physical Exam: Vitals:   02/28/24 1935 02/28/24 2338 02/29/24 0348 02/29/24 0745  BP: 131/64 (!) 102/57 (!) 110/59 131/76  Pulse: 60 (!) 47 (!) 54 (!) 53  Resp: 16 16 16 19   Temp: 98.2 F (36.8 C) 98.3 F (36.8 C)  97.8 F (36.6 C) 98.7 F (37.1 C)  TempSrc: Oral Oral    SpO2: 100% 95% 96%   Weight:      Height:        General - Elderly Caucasian ill male, no respiratory distress HEENT - PERRLA, EOMI, atraumatic head, non tender sinuses. Lung - Clear, basal rales, wheezes. Heart - S1, S2 heard, no murmurs, rubs, left foot pedal edema. Abdomen - Soft, non tender,  bowel sounds good Neuro - Alert, awake and oriented, non focal exam. Skin - Warm and dry. 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  Data Reviewed:      Latest Ref Rng & Units 02/27/2024    5:16 AM 02/26/2024    9:50 AM 02/24/2024    9:40 AM  CBC  WBC 4.0 - 10.5 K/uL 9.8  11.2  5.4   Hemoglobin 13.0 - 17.0 g/dL 9.1  89.8  9.3   Hematocrit 39.0 - 52.0 % 28.6  32.2  29.7   Platelets 150 - 400 K/uL 150  160  149       Latest Ref Rng & Units 02/27/2024    5:16 AM 02/26/2024    9:50 AM 02/24/2024    9:40 AM  BMP  Glucose 70 - 99 mg/dL 867  867  82   BUN 8 - 23 mg/dL 39  32  24   Creatinine 0.61 - 1.24 mg/dL 7.30  7.99  8.14   Sodium 135 - 145 mmol/L 137  140  143   Potassium 3.5 - 5.1 mmol/L 3.7  4.1  4.1   Chloride 98 - 111 mmol/L 106  112  110   CO2 22 - 32 mmol/L 17  18  21    Calcium  8.9 - 10.3 mg/dL 8.1  8.4  8.7    No results found.   Family Communication: Discussed with patient, he understand and agree. All questions answered.  Disposition: Status is: Inpatient Remains inpatient appropriate because: severity of illness  Planned Discharge Destination: Home with Home Health     Time spent: 44 minutes  Author: Concepcion Riser, MD 02/29/2024 8:19 AM Secure chat 7am to 7pm For on call review [email or web address].

## 2024-02-29 NOTE — TOC Progression Note (Signed)
 Transition of Care [facility]) - Progression Note    Patient Details  Name: Gregory [name] MRN: [id number] Date of Birth: [birth year]  Transition of Care [facility]) CM/SW Contact  Gwenn Frieze [place], [place] Phone Number: 02/29/2024, 3:41 PM  Clinical Narrative:  Reached out to [facility] with [facility] re bed availability. Await response.   Frieze Gwenn, MSW, LCSW [phone] (coverage)      Expected Discharge Plan: Skilled Nursing Facility Barriers to Discharge: Continued Medical Work up               Expected Discharge Plan and Services In-house Referral: Clinical Social Work     Living arrangements for the past 2 months: Skilled Nursing Facility Expected Discharge Date: 02/28/24                                     Social Drivers of Health (SDOH) Interventions SDOH Screenings   Food Insecurity: No Food Insecurity (02/23/2024)  Housing: Low Risk  (02/23/2024)  Transportation Needs: No Transportation Needs (02/23/2024)  Utilities: Not At Risk (02/23/2024)  Depression (PHQ2-9): Low Risk  (03/08/2021)  Social Connections: Socially Isolated (09/26/2023)  Tobacco Use: High Risk (12/24/2023)    Readmission Risk Interventions    09/04/2023    1:49 PM 09/06/2022    1:48 PM 08/27/2022   12:46 PM  Readmission Risk Prevention Plan  Transportation Screening Complete Complete Complete  Medication Review Oceanographer) Complete Complete Complete  PCP or Specialist appointment within 3-5 days of discharge Complete Complete Complete  HRI or Home Care Consult Complete Complete Complete  SW Recovery Care/Counseling Consult Complete Complete Complete  Palliative Care Screening Not Applicable Not Applicable Not Applicable  Skilled Nursing Facility Complete Complete Complete

## 2024-02-29 NOTE — Progress Notes (Signed)
 Physical Therapy Treatment Patient Details Name: Gregory [name] MRN: [id number] DOB: [birth year] Today's Date: 02/29/2024   History of Present Illness 63 yo male from [place] [facility] admitted with AMS on 02/21/24 with sepsis due to LLE cellulitis. PMH includes: DM II, HTN, HLD, CKD II, COPD, s/p R AKA, L midfoot amputation, CVA, Afib, mesenteric ischemia s/p colostomy.    PT Comments  Pt supine in bed on arrival this session.  He was pleasant and agree and felt remorseful about refusing previous PT attempts as he reports,  I am really weak.  I should have gotten up when they came.  Pt left sitting in recliner chair post transfer.      If plan is discharge home, recommend the following: Two people to help with walking and/or transfers;Two people to help with bathing/dressing/bathroom;Assistance with feeding;Direct supervision/assist for medications management;Direct supervision/assist for financial management;Assist for transportation;Help with stairs or ramp for entrance;Supervision due to cognitive status;Assistance with cooking/housework   Can travel by private vehicle     No  Equipment Recommendations  Wheelchair (measurements PT);Wheelchair cushion (measurements PT);Hoyer lift;Hospital bed    Recommendations for Other Services       Precautions / Restrictions Precautions Precautions: Fall Recall of Precautions/Restrictions: Impaired Precaution/Restrictions Comments: ostomy Restrictions Weight Bearing Restrictions Per Provider Order: No     Mobility  Bed Mobility Overal bed mobility: Needs Assistance Bed Mobility: Supine to Sit     Supine to sit: Min assist     General bed mobility comments: Pt with minor balance in increase difficulty scooting to edge of bed.  He required assistance with his trunk and LLE position pre transfer OOB to recliner.    Transfers Overall transfer level: Needs assistance Equipment used: None Transfers: Bed to chair/wheelchair/BSC       Squat  pivot transfers: Max assist, Total assist     General transfer comment: Performed face to face transfer boosting hips up away from bed and then pivoting to his recliner.  He required cues for hand placement and forward weight shifting.    Ambulation/Gait                   Stairs             Wheelchair Mobility     Tilt Bed    Modified Rankin (Stroke Patients Only)       Balance Overall balance assessment: Mild deficits observed, not formally tested                                          Communication Communication Communication: No apparent difficulties  Cognition Arousal: Alert Behavior During Therapy: WFL for tasks assessed/performed     Difficult to assess due to:  (agitation)                     PT - Cognition Comments: wants to get his table back at end of transfer before positioning better into his recliner.        Cueing Cueing Techniques: Verbal cues, Gestural cues, Tactile cues, Visual cues  Exercises      General Comments        Pertinent Vitals/Pain Pain Assessment Pain Assessment: Faces Faces Pain Scale: Hurts a little bit Pain Location: LLE Pain Descriptors / Indicators: Discomfort Pain Intervention(s): Monitored during session, Repositioned    Home Living Family/patient expects to be discharged to:: Skilled nursing  facility                   Additional Comments: from [facility]    Prior Function            PT Goals (current goals can now be found in the care plan section) Acute Rehab PT Goals Potential to Achieve Goals: Fair Progress towards PT goals: Progressing toward goals    Frequency    Min 2X/week      PT Plan      Co-evaluation              AM-PAC PT 6 Clicks Mobility   Outcome Measure  Help needed turning from your back to your side while in a flat bed without using bedrails?: A Lot Help needed moving from lying on your back to sitting on the side  of a flat bed without using bedrails?: A Lot Help needed moving to and from a bed to a chair (including a wheelchair)?: A Lot Help needed standing up from a chair using your arms (e.g., wheelchair or bedside chair)?: Total Help needed to walk in hospital room?: Total Help needed climbing 3-5 steps with a railing? : Total 6 Click Score: 9    End of Session Equipment Utilized During Treatment: Gait belt Activity Tolerance: Patient tolerated treatment well Patient left: in chair;with call bell/phone within reach;with chair alarm set Nurse Communication: Mobility status;Need for lift equipment;Precautions PT Visit Diagnosis: Unsteadiness on feet (R26.81);Other abnormalities of gait and mobility (R26.89);Muscle weakness (generalized) (M62.81);Pain     Time: 9040-8984 PT Time Calculation (min) (ACUTE ONLY): 16 min  Charges:    $Gait Training: 8-22 mins PT General Charges $$ ACUTE PT VISIT: 1 Visit                     Toya HAMS , PTA Acute Rehabilitation Services Office [phone]    Cameshia Cressman JINNY Gosling 02/29/2024, 2:12 PM

## 2024-03-01 DIAGNOSIS — R198 Other specified symptoms and signs involving the digestive system and abdomen: Secondary | ICD-10-CM

## 2024-03-01 DIAGNOSIS — Z932 Ileostomy status: Secondary | ICD-10-CM

## 2024-03-01 DIAGNOSIS — N1832 Chronic kidney disease, stage 3b: Secondary | ICD-10-CM | POA: Diagnosis not present

## 2024-03-01 DIAGNOSIS — N170 Acute kidney failure with tubular necrosis: Secondary | ICD-10-CM

## 2024-03-01 DIAGNOSIS — R6520 Severe sepsis without septic shock: Secondary | ICD-10-CM

## 2024-03-01 DIAGNOSIS — A419 Sepsis, unspecified organism: Secondary | ICD-10-CM | POA: Diagnosis not present

## 2024-03-01 DIAGNOSIS — Z794 Long term (current) use of insulin: Secondary | ICD-10-CM

## 2024-03-01 DIAGNOSIS — G9340 Encephalopathy, unspecified: Secondary | ICD-10-CM | POA: Diagnosis not present

## 2024-03-01 DIAGNOSIS — F05 Delirium due to known physiological condition: Secondary | ICD-10-CM | POA: Diagnosis not present

## 2024-03-01 DIAGNOSIS — Z89611 Acquired absence of right leg above knee: Secondary | ICD-10-CM

## 2024-03-01 DIAGNOSIS — E1165 Type 2 diabetes mellitus with hyperglycemia: Secondary | ICD-10-CM

## 2024-03-01 LAB — GLUCOSE, CAPILLARY
Glucose-Capillary: 108 mg/dL — ABNORMAL HIGH (ref 70–99)
Glucose-Capillary: 104 mg/dL — ABNORMAL HIGH (ref 70–99)
Glucose-Capillary: 143 mg/dL — ABNORMAL HIGH (ref 70–99)

## 2024-03-01 MED ORDER — AMOXICILLIN-POT CLAVULANATE 875-125 MG PO TABS: 1.0000 | ORAL_TABLET | Freq: Two times a day (BID) | ORAL | Status: DC

## 2024-03-01 MED ORDER — AMOXICILLIN-POT CLAVULANATE 500-125 MG PO TABS
1.0000 | ORAL_TABLET | Freq: Two times a day (BID) | ORAL | Status: DC
  Filled 2024-03-01: qty 1

## 2024-03-01 NOTE — Plan of Care (Signed)
  Problem: Safety: Goal: Non-violent Restraint(s) Outcome: Progressing   Problem: Education: Goal: Knowledge of General Education information will improve Description: Including pain rating scale, medication(s)/side effects and non-pharmacologic comfort measures Outcome: Progressing   Problem: Health Behavior/Discharge Planning: Goal: Ability to manage health-related needs will improve Outcome: Progressing   Problem: Clinical Measurements: Goal: Ability to maintain clinical measurements within normal limits will improve Outcome: Progressing Goal: Will remain free from infection Outcome: Progressing Goal: Diagnostic test results will improve Outcome: Progressing Goal: Respiratory complications will improve Outcome: Progressing Goal: Cardiovascular complication will be avoided Outcome: Progressing   Problem: Activity: Goal: Risk for activity intolerance will decrease Outcome: Progressing   Problem: Nutrition: Goal: Adequate nutrition will be maintained Outcome: Progressing   Problem: Coping: Goal: Level of anxiety will decrease Outcome: Progressing   Problem: Elimination: Goal: Will not experience complications related to bowel motility Outcome: Progressing Goal: Will not experience complications related to urinary retention Outcome: Progressing   Problem: Pain Managment: Goal: General experience of comfort will improve and/or be controlled Outcome: Progressing   Problem: Safety: Goal: Ability to remain free from injury will improve Outcome: Progressing   Problem: Skin Integrity: Goal: Risk for impaired skin integrity will decrease Outcome: Progressing   Problem: Education: Goal: Ability to describe self-care measures that may prevent or decrease complications (Diabetes Survival Skills Education) will improve Outcome: Progressing Goal: Individualized Educational Video(s) Outcome: Progressing   Problem: Coping: Goal: Ability to adjust to condition or change  in health will improve Outcome: Progressing   Problem: Fluid Volume: Goal: Ability to maintain a balanced intake and output will improve Outcome: Progressing   Problem: Health Behavior/Discharge Planning: Goal: Ability to identify and utilize available resources and services will improve Outcome: Progressing Goal: Ability to manage health-related needs will improve Outcome: Progressing   Problem: Metabolic: Goal: Ability to maintain appropriate glucose levels will improve Outcome: Progressing   Problem: Nutritional: Goal: Maintenance of adequate nutrition will improve Outcome: Progressing Goal: Progress toward achieving an optimal weight will improve Outcome: Progressing   Problem: Skin Integrity: Goal: Risk for impaired skin integrity will decrease Outcome: Progressing   Problem: Tissue Perfusion: Goal: Adequacy of tissue perfusion will improve Outcome: Progressing

## 2024-03-01 NOTE — Plan of Care (Signed)
  Problem: Safety: Goal: Non-violent Restraint(s) Outcome: Completed/Met   Problem: Education: Goal: Knowledge of General Education information will improve Description: Including pain rating scale, medication(s)/side effects and non-pharmacologic comfort measures Outcome: Completed/Met   Problem: Health Behavior/Discharge Planning: Goal: Ability to manage health-related needs will improve Outcome: Completed/Met   Problem: Clinical Measurements: Goal: Ability to maintain clinical measurements within normal limits will improve Outcome: Completed/Met Goal: Will remain free from infection Outcome: Completed/Met Goal: Diagnostic test results will improve Outcome: Completed/Met Goal: Respiratory complications will improve Outcome: Completed/Met Goal: Cardiovascular complication will be avoided Outcome: Completed/Met   Problem: Activity: Goal: Risk for activity intolerance will decrease Outcome: Completed/Met   Problem: Nutrition: Goal: Adequate nutrition will be maintained Outcome: Completed/Met   Problem: Coping: Goal: Level of anxiety will decrease Outcome: Completed/Met   Problem: Elimination: Goal: Will not experience complications related to bowel motility Outcome: Completed/Met Goal: Will not experience complications related to urinary retention Outcome: Completed/Met   Problem: Pain Managment: Goal: General experience of comfort will improve and/or be controlled Outcome: Completed/Met   Problem: Safety: Goal: Ability to remain free from injury will improve Outcome: Completed/Met   Problem: Skin Integrity: Goal: Risk for impaired skin integrity will decrease Outcome: Completed/Met   Problem: Education: Goal: Ability to describe self-care measures that may prevent or decrease complications (Diabetes Survival Skills Education) will improve Outcome: Completed/Met Goal: Individualized Educational Video(s) Outcome: Completed/Met   Problem: Coping: Goal:  Ability to adjust to condition or change in health will improve Outcome: Completed/Met   Problem: Fluid Volume: Goal: Ability to maintain a balanced intake and output will improve Outcome: Completed/Met   Problem: Health Behavior/Discharge Planning: Goal: Ability to identify and utilize available resources and services will improve Outcome: Completed/Met Goal: Ability to manage health-related needs will improve Outcome: Completed/Met   Problem: Metabolic: Goal: Ability to maintain appropriate glucose levels will improve Outcome: Completed/Met   Problem: Nutritional: Goal: Maintenance of adequate nutrition will improve Outcome: Completed/Met Goal: Progress toward achieving an optimal weight will improve Outcome: Completed/Met   Problem: Skin Integrity: Goal: Risk for impaired skin integrity will decrease Outcome: Completed/Met   Problem: Tissue Perfusion: Goal: Adequacy of tissue perfusion will improve Outcome: Completed/Met

## 2024-03-01 NOTE — TOC Transition Note (Signed)
 Transition of Care [facility]) - Discharge Note   Patient Details  Name: Gregory [name] MRN: [id number] Date of Birth: January 04, 1961  Transition [facility]) CM/SW Contact:  Gwenn Julien [place], [place] Phone Number: 03/01/2024, 3:54 PM   Clinical Narrative: Pt for dc back to [facility] today. Spoke to [facility] in admissions who confirmed they are prepared to accept pt. RN provided with number for report. [name] dtr [name] aware of dc and plans to transport pt via private vehicle. SW signing off at dc.   Julien Gwenn, MSW, LCSW [phone] (coverage)        Final next level of care: Skilled Nursing Facility Barriers to Discharge: Barriers Resolved   Patient Goals and CMS Choice     Choice offered to / list presented to : Adult Children      Discharge Placement              Patient chooses bed at: Other - please specify in the comment section below: [name]) Patient to be transferred to facility by: Family Name of family member notified: Gregory [name] Patient and family notified of of transfer: 03/01/24  Discharge Plan and Services Additional resources added to the After Visit Summary for   In-house Referral: Clinical Social Work                                   Social Drivers of Health (SDOH) Interventions SDOH Screenings   Food Insecurity: No Food Insecurity (02/23/2024)  Housing: Low Risk  (02/23/2024)  Transportation Needs: No Transportation Needs (02/23/2024)  Utilities: Not At Risk (02/23/2024)  Depression (PHQ2-9): Low Risk  (03/08/2021)  Social Connections: Socially Isolated (09/26/2023)  Tobacco Use: High Risk (12/24/2023)     Readmission Risk Interventions    09/04/2023    1:49 PM 09/06/2022    1:48 PM 08/27/2022   12:46 PM  Readmission Risk Prevention Plan  Transportation Screening Complete Complete Complete  Medication Review Oceanographer) Complete Complete Complete  PCP or Specialist appointment within 3-5 days of discharge Complete Complete  Complete  HRI or Home Care Consult Complete Complete Complete  SW Recovery Care/Counseling Consult Complete Complete Complete  Palliative Care Screening Not Applicable Not Applicable Not Applicable  Skilled Nursing Facility Complete Complete Complete

## 2024-03-01 NOTE — TOC Progression Note (Signed)
 Transition of Care [facility]) - Progression Note    Patient Details  Name: Gregory [name] MRN: [id number] Date of Birth: 11/12/1960  Transition [facility] Care [facility]) CM/SW Contact  Gwenn Frieze [place], [place] Phone Number: 03/01/2024, 10:02 AM  Clinical Narrative: Per [place] with [facility], no bed availability today. Will check with them again tomorrow morning.   Frieze Gwenn, MSW, LCSW [phone] (coverage)        Expected Discharge Plan: Skilled Nursing Facility Barriers to Discharge: Continued Medical Work up               Expected Discharge Plan and Services In-house Referral: Clinical Social Work     Living arrangements for the past 2 months: Skilled Nursing Facility Expected Discharge Date: 02/28/24                                     Social Drivers of Health (SDOH) Interventions SDOH Screenings   Food Insecurity: No Food Insecurity (02/23/2024)  Housing: Low Risk  (02/23/2024)  Transportation Needs: No Transportation Needs (02/23/2024)  Utilities: Not At Risk (02/23/2024)  Depression (PHQ2-9): Low Risk  (03/08/2021)  Social Connections: Socially Isolated (09/26/2023)  Tobacco Use: High Risk (12/24/2023)    Readmission Risk Interventions    09/04/2023    1:49 PM 09/06/2022    1:48 PM 08/27/2022   12:46 PM  Readmission Risk Prevention Plan  Transportation Screening Complete Complete Complete  Medication Review Oceanographer) Complete Complete Complete  PCP or Specialist appointment within 3-5 days of discharge Complete Complete Complete  HRI or Home Care Consult Complete Complete Complete  SW Recovery Care/Counseling Consult Complete Complete Complete  Palliative Care Screening Not Applicable Not Applicable Not Applicable  Skilled Nursing Facility Complete Complete Complete

## 2024-03-01 NOTE — Progress Notes (Signed)
 Progress Note   Patient: Gregory [name] FMW:[id number] DOB: [birth year] DOA: 02/21/2024     9 DOS: the patient was seen and examined on 03/01/2024   Brief hospital course: Gregory [name] is a 63 y.o. male with medical history significant of hypertension, hyperlipidemia, type 2 diabetes on insulin , cervical spinal stenosis, high output ileostomy, COPD, right AKA, chronic pain syndrome on multiple narcotics, stroke, and recent admission in 12/2023 for GBS bacteremia and encephalopathy presented with confusion/ encephalopathy and found to have sepsis 2/2 LLE cellulitis.   In the ED, pt hypertensive, tachycardic, and tachypneic w/o hypoxia. Febrile to 104. Labs notable for Cr 2.13, WBC 16.6, and lactic acid 2. EDP started IV abx and requested medicine admission for sepsis.    He ws found to have  possible Osteomyelitis in the third metatarsal head versus nondisplaced fracture. Bony erosions along the tufts of the distal phalanx of the fourth and fifth digits, possible osteomyelitis on x rays of the foot. Refused to get MRI due to claustrophobia. Orthopedics consulted. He has been on and off agitated and lethargic with medications.  Assessment and Plan: Sepsis due to left lower extremity cellulitis- Possible osteomyelitis of 3rd metatarsal, 4th, 5th distal phalanx - Presented with fever, tachycardic, tachypneic, with leukocytosis and elevated lactic acid  Got broad spectrum IV antibiotics Zyvox , Zosyn  which I changed to augmentin . Sepsis physiology improving.  CRP is 11.8, sed rate around 69. Resp panel is negative. UA  unremarkable. Chest xray shows possible bronchitis. Blood cultures negative so far.  He has claustrophobia and does not want to get MRI,  CT scan left leg, foot reviewed. Left ABI within normal range. Ortho consult pending. Sepsis physiology improved. His left leg, foot swelling redness improved. Patient does not want any ortho intervention and refuses to stay in hospital for consultation.   PCP follow up and ortho follow up advised.   Acute toxic metabolic encephalopathy- In the setting of sepsis, multiple opiate medications, psychiatry medications. His mental status much improved.  Psychiatry follow up appreciated.  Continue antibiotics for infection. Avoid opiates if possible. Delirium precautions.  Chronic pain syndrome- Oxycodone  IR dose decreased to 5mg  q4hr PRN. Avoid opiates if he is more sleepy.  COPD- Does have wheezes, today requiring 5L o2. ABG showed low PO2.  Wean O2 gradually. Continue duonebs PRN.  Hypertension- BP lower side. Caution with antihypertensives, opiates.       Out of bed to chair. Incentive spirometry. Nursing supportive care. Fall, aspiration precautions. Diet:  Diet Orders (From admission, onward)     Start     Ordered   02/21/24 1729  Diet Carb Modified Fluid consistency: Thin; Room service appropriate? Yes  Diet effective now       Question Answer Comment  Diet-HS Snack? Nothing   Calorie Level Medium 1600-2000   Fluid consistency: Thin   Room service appropriate? Yes      02/21/24 1728           DVT prophylaxis: enoxaparin  (LOVENOX ) injection 40 mg Start: 02/29/24 1800  Level of care: Med-Surg   Code Status: Full Code  Subjective: Patient is seen and examined today morning. He is more alert today, no complaints, asked about his discharge pain meds. He wants to talk to facility about bed availability.  Physical Exam: Vitals:   02/29/24 2348 03/01/24 0400 03/01/24 0742 03/01/24 1238  BP: (!) 126/57 (!) 113/57 (!) 127/57 135/66  Pulse: (!) 48 (!) 44 (!) 44 (!) 49  Resp: 18 16 19  19  Temp: 97.6 F (36.4 C) 98.9 F (37.2 C) 98.3 F (36.8 C) 97.8 F (36.6 C)  TempSrc:  Oral  Oral  SpO2: 100% 100% 94% 95%  Weight:      Height:        General - Elderly Caucasian ill male, no respiratory distress HEENT - PERRLA, EOMI, atraumatic head, non tender sinuses. Lung - Clear, basal rales, wheezes. Heart - S1,  S2 heard, no murmurs, rubs, left foot pedal edema, redness improved. Abdomen - Soft, non tender, bowel sounds good Neuro - Alert, awake and oriented, non focal exam. Skin - Warm and dry. 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  Data Reviewed:      Latest Ref Rng & Units 02/27/2024    5:16 AM 02/26/2024    9:50 AM 02/24/2024    9:40 AM  CBC  WBC 4.0 - 10.5 K/uL 9.8  11.2  5.4   Hemoglobin 13.0 - 17.0 g/dL 9.1  89.8  9.3   Hematocrit 39.0 - 52.0 % 28.6  32.2  29.7   Platelets 150 - 400 K/uL 150  160  149       Latest Ref Rng & Units 02/27/2024    5:16 AM 02/26/2024    9:50 AM 02/24/2024    9:40 AM  BMP  Glucose 70 - 99 mg/dL 867  867  82   BUN 8 - 23 mg/dL 39  32  24   Creatinine 0.61 - 1.24 mg/dL 7.30  7.99  8.14   Sodium 135 - 145 mmol/L 137  140  143   Potassium 3.5 - 5.1 mmol/L 3.7  4.1  4.1   Chloride 98 - 111 mmol/L 106  112  110   CO2 22 - 32 mmol/L 17  18  21    Calcium  8.9 - 10.3 mg/dL 8.1  8.4  8.7    No results found.   Family Communication: Discussed with patient, he understand and agree. All questions answered.  Disposition: Status is: Inpatient Remains inpatient appropriate because: awaiting facility bed availability.  Planned Discharge Destination: Home with Home Health     Time spent: 43 minutes  Author: Concepcion Riser, MD 03/01/2024 12:48 PM Secure chat 7am to 7pm For on call review [email or web address].

## 2024-03-01 NOTE — Progress Notes (Signed)
 PHARMACY NOTE:  ANTIMICROBIAL RENAL DOSAGE ADJUSTMENT  Current antimicrobial regimen includes a mismatch between antimicrobial dosage and estimated renal function.  As per policy approved by the Pharmacy & Therapeutics and Medical Executive Committees, the antimicrobial dosage will be adjusted accordingly.  Current antimicrobial dosage:  Augmentin  875-125 mg tablet Q12H  Indication: Lower extremity cellulitis  Renal Function:  Estimated Creatinine Clearance: 30.5 mL/min (A) (by C-G formula based on SCr of 2.69 mg/dL (H)).  Antimicrobial dosage has been changed to:  Augmentin  500-125 tablet Q12H  Thank you for allowing pharmacy to be a part of this patient's care.  Maurilio Patten, PharmD PGY1 Pharmacy Resident [facility]  03/01/2024 12:59 PM

## 2024-03-06 ENCOUNTER — Emergency Department (HOSPITAL_COMMUNITY)
Admission: EM | Admit: 2024-03-06 | Discharge: 2024-03-07 | Disposition: A | Discharge status: Nursing Home (Includes ICF) | Source: Skilled Nursing Facility | Attending: Emergency Medicine | Admitting: Emergency Medicine

## 2024-03-06 DIAGNOSIS — I130 Hypertensive heart and chronic kidney disease with heart failure and stage 1 through stage 4 chronic kidney disease, or unspecified chronic kidney disease: Secondary | ICD-10-CM | POA: Insufficient documentation

## 2024-03-06 DIAGNOSIS — E119 Type 2 diabetes mellitus without complications: Secondary | ICD-10-CM | POA: Diagnosis not present

## 2024-03-06 DIAGNOSIS — J449 Chronic obstructive pulmonary disease, unspecified: Secondary | ICD-10-CM | POA: Diagnosis not present

## 2024-03-06 DIAGNOSIS — Z794 Long term (current) use of insulin: Secondary | ICD-10-CM | POA: Diagnosis not present

## 2024-03-06 DIAGNOSIS — Z91040 Latex allergy status: Secondary | ICD-10-CM | POA: Diagnosis not present

## 2024-03-06 DIAGNOSIS — N189 Chronic kidney disease, unspecified: Secondary | ICD-10-CM | POA: Diagnosis not present

## 2024-03-06 DIAGNOSIS — Z7984 Long term (current) use of oral hypoglycemic drugs: Secondary | ICD-10-CM | POA: Insufficient documentation

## 2024-03-06 DIAGNOSIS — I509 Heart failure, unspecified: Secondary | ICD-10-CM | POA: Diagnosis not present

## 2024-03-06 DIAGNOSIS — Z7982 Long term (current) use of aspirin: Secondary | ICD-10-CM | POA: Insufficient documentation

## 2024-03-06 DIAGNOSIS — X58XXXA Exposure to other specified factors, initial encounter: Secondary | ICD-10-CM | POA: Diagnosis not present

## 2024-03-06 DIAGNOSIS — T50904A Poisoning by unspecified drugs, medicaments and biological substances, undetermined, initial encounter: Secondary | ICD-10-CM | POA: Diagnosis present

## 2024-03-06 LAB — CBC WITH DIFFERENTIAL/PLATELET
Abs Immature Granulocytes: 0.09 K/uL — ABNORMAL HIGH (ref 0.00–0.07)
Basophils Absolute: 0.1 K/uL (ref 0.0–0.1)
Basophils Relative: 1 %
Eosinophils Absolute: 0.5 K/uL (ref 0.0–0.5)
Eosinophils Relative: 4 %
HCT: 30.1 % — ABNORMAL LOW (ref 39.0–52.0)
Hemoglobin: 9.1 g/dL — ABNORMAL LOW (ref 13.0–17.0)
Immature Granulocytes: 1 %
Lymphocytes Relative: 16 %
Lymphs Abs: 2 K/uL (ref 0.7–4.0)
MCH: 29.3 pg (ref 26.0–34.0)
MCHC: 30.2 g/dL (ref 30.0–36.0)
MCV: 96.8 fL (ref 80.0–100.0)
Monocytes Absolute: 0.9 K/uL (ref 0.1–1.0)
Monocytes Relative: 7 %
Neutro Abs: 9 K/uL — ABNORMAL HIGH (ref 1.7–7.7)
Neutrophils Relative %: 71 %
Platelets: 257 K/uL (ref 150–400)
RBC: 3.11 MIL/uL — ABNORMAL LOW (ref 4.22–5.81)
RDW: 15.8 % — ABNORMAL HIGH (ref 11.5–15.5)
WBC: 12.5 K/uL — ABNORMAL HIGH (ref 4.0–10.5)
nRBC: 0 % (ref 0.0–0.2)

## 2024-03-06 LAB — BASIC METABOLIC PANEL WITH GFR
Anion gap: 13 (ref 5–15)
BUN: 36 mg/dL — ABNORMAL HIGH (ref 8–23)
CO2: 19 mmol/L — ABNORMAL LOW (ref 22–32)
Calcium: 9 mg/dL (ref 8.9–10.3)
Chloride: 109 mmol/L (ref 98–111)
Creatinine, Ser: 2.35 mg/dL — ABNORMAL HIGH (ref 0.61–1.24)
GFR, Estimated: 30 mL/min — ABNORMAL LOW (ref >60)
Glucose, Bld: 160 mg/dL — ABNORMAL HIGH (ref 70–99)
Potassium: 5.1 mmol/L (ref 3.5–5.1)
Sodium: 141 mmol/L (ref 135–145)

## 2024-03-06 LAB — RAPID URINE DRUG SCREEN, HOSP PERFORMED
Amphetamines: NOT DETECTED
Barbiturates: NOT DETECTED
Benzodiazepines: POSITIVE — AB
Cocaine: NOT DETECTED
Opiates: NOT DETECTED
Tetrahydrocannabinol: NOT DETECTED

## 2024-03-06 MED ORDER — NALOXONE HCL 0.4 MG/ML IJ SOLN: 0.4000 mg | INTRAMUSCULAR | Status: DC | PRN

## 2024-03-06 NOTE — ED Provider Notes (Signed)
 [place] [facility] DEPARTMENT [facility] Provider Note   CSN: [id number] Arrival date & time: 03/06/24  1619     History Chief Complaint  Patient presents with   Altered Mental Status    HPI: Gregory [name] is a 63 y.o. 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 Patient arrived via EMS from living facility.  History provided by patient.  No interpreter required during this encounter.  EMS reports that they were called out to patient's facility due to concern for overdose.  Patient reportedly was in the courtyard of his facility and was on the ground unresponsive.  Reportedly patient received Narcan  sometime prior to 3:30 PM by facility staff and had returned to [facility].  EMS reports that the patient has been uncooperative, however has been stable en route.  Patient reports that he feels terrible, reports that he always feels terrible when he receives Narcan , however denies other acute complaint.  EMS reports that facility suspected that patient took another residents medication, patient denies this.  Patient's recorded medical, surgical, social, medication list and allergies were reviewed in the Snapshot window as part of the initial history.   Prior to Admission medications   Medication Sig Start Date End Date Taking? Authorizing Provider  acetaminophen  (TYLENOL ) 325 MG tablet Take 2 tablets (650 mg total) by mouth every 6 (six) hours as needed for mild pain (pain score 1-3) (or Fever >/= 101). 09/08/23   Elgergawy, Brayton RAMAN, MD  albuterol  (PROVENTIL ) (2.5 MG/3ML) 0.083% nebulizer solution Take 2.5 mg by nebulization every 6 (six) hours as needed for wheezing or shortness of breath.    [provider]  albuterol  (VENTOLIN  HFA) 108 (90 Base) MCG/ACT inhaler Inhale 2 puffs into the lungs every  6 (six) hours as needed for wheezing or shortness of breath.    [provider]  aspirin  EC 81 MG tablet Take 81 mg by mouth daily.    [provider]  atorvastatin  (LIPITOR ) 80 MG tablet Take 1 tablet (80 mg total) by mouth daily. 02/28/24 03/29/24  Darci Pore, MD  busPIRone  (BUSPAR ) 10 MG tablet Take 10 mg by mouth 3 (three) times daily.    [provider]  Calcium  Carbonate Antacid 648 MG TABS Take 2 tablets by mouth every 8 (eight) hours as needed (Indigestion).    [provider]  Cholecalciferol  25 MCG (1000 UT) capsule Take 2,000 Units by mouth daily.    [provider]  docusate sodium  (COLACE) 100 MG capsule Take 100 mg by mouth every 12 (twelve) hours.    [provider]  doxepin  (SINEQUAN ) 25 MG capsule Take 50 mg by mouth at bedtime.    [provider]  ferrous sulfate  325 (65 FE) MG tablet Take 1 tablet (325 mg total) by mouth 3 (three) times daily with meals. 01/23/23   Steinl, Kevin, MD  hydrOXYzine  (ATARAX ) 25 MG tablet Take 25 mg by mouth every 6 (six) hours as needed for anxiety.    [provider]  insulin  lispro (HUMALOG ) 100 UNIT/ML KwikPen Inject 0-10 Units into the skin See admin instructions. Inject 0-10 units into the skin three times a day and at bedtime, PER SLIDING SCALE:  BGL 151-200 = 2 units 201-250 = 4 units 251-300 = 6 units 301-350 =  8 units 351-400 = 10 units 01/31/22   Dahal,  Chapman, MD  JOURNAVX  50 MG TABS Take 1 tablet by mouth 2 (two) times daily. 01/22/24   [provider]  lactulose  (CHRONULAC ) 10 GM/15ML solution Take 30 mLs (20 g total) by mouth 2 (two) times daily. 08/19/23   Samtani, Jai-Gurmukh, MD  levETIRAcetam  (KEPPRA ) 500 MG tablet Take 1 tablet (500 mg total) by mouth 2 (two) times daily. 12/03/23 03/02/24  Jerrol Agent, MD  lidocaine  4 % Place 1 patch onto the skin daily. Apply 1 new patch to the affected area and remove 12 hours later    [provider]   magnesium  oxide (MAG-OX) 400 (240 Mg) MG tablet Take 400 mg by mouth 3 (three) times daily.    [provider]  Melatonin 10 MG TABS Take 10 mg by mouth at bedtime.    [provider]  Meth-Hyo-M Bl-Na Phos-Ph Sal (URO-MP) 118 MG CAPS Take 118 mg by mouth 2 (two) times daily.    [provider]  methocarbamol  (ROBAXIN ) 500 MG tablet Take 500 mg by mouth every 6 (six) hours as needed for muscle spasms.    [provider]  Multiple Vitamins-Minerals (MULTIVITAMIN WITH MINERALS) tablet Take 1 tablet by mouth daily.    [provider]  naloxone  (NARCAN ) 0.4 MG/ML injection Inject 0.4 mg into the muscle as needed (for oversedation).    [provider]  naloxone  (NARCAN ) nasal spray 4 mg/0.1 mL Place 1 spray into the nose See admin instructions. Instill 1 spray into alternating nostrils every 5 minutes as needed for an opiate overdose    [provider]  ondansetron  (ZOFRAN ) 4 MG tablet Take 4 mg by mouth every 6 (six) hours as needed for nausea or vomiting.    [provider]  OXcarbazepine  (TRILEPTAL ) 150 MG tablet Take 1 tablet (150 mg total) by mouth 2 (two) times daily. 08/19/23   Samtani, Jai-Gurmukh, MD  pregabalin  (LYRICA ) 200 MG capsule Take 200 mg by mouth 2 (two) times daily.    [provider]  QUEtiapine  (SEROQUEL ) 25 MG tablet Take 1 tablet (25 mg total) by mouth at bedtime and may repeat dose one time if needed. 02/28/24   Darci Pore, MD  senna (SENOKOT) 8.6 MG TABS tablet Take 2 tablets by mouth at bedtime.    [provider]  sodium bicarbonate  650 MG tablet Take 650 mg by mouth 2 (two) times daily.    [provider]  tamsulosin  (FLOMAX ) 0.4 MG CAPS capsule Take 1 capsule (0.4 mg total) by mouth daily. 08/19/23   Samtani, Jai-Gurmukh, MD     Allergies: Latex   Review of Systems   ROS as per HPI  Physical Exam Updated Vital Signs BP 123/65   Pulse 63   Temp 98.3 F (36.8 C)  (Temporal)   Resp 17   SpO2 98%  Physical Exam Vitals and nursing note reviewed.  Constitutional:      General: He is not in acute distress.    Appearance: He is well-developed. He is obese.     Comments: Chronically ill-appearing  HENT:     Head: Normocephalic and atraumatic.  Eyes:     Conjunctiva/sclera: Conjunctivae normal.  Cardiovascular:     Rate and Rhythm: Normal rate and regular rhythm.     Heart sounds: No murmur heard. Pulmonary:     Effort: Pulmonary effort is normal. No respiratory distress.     Breath sounds: Normal breath sounds.  Abdominal:     Palpations: Abdomen is soft.  Tenderness: There is no abdominal tenderness.  Musculoskeletal:        General: No swelling.     Cervical back: Neck supple.     Left lower leg: Edema present.     Comments: Status post right-sided AKA  Skin:    General: Skin is warm and dry.     Capillary Refill: Capillary refill takes less than 2 seconds.  Neurological:     Mental Status: He is alert.  Psychiatric:        Mood and Affect: Mood normal.     ED Course/ Medical Decision Making/ A&P    Procedures Procedures   Medications Ordered in ED Medications  naloxone  (NARCAN ) injection 0.4 mg (has no administration in time range)    Medical Decision Making:   Gregory [name] is a 63 y.o. male who presents for episode of unresponsiveness with improvement after Narcan  as per above.  Physical exam is pertinent for no focal abnormalities.   The differential includes but is not limited to overdose, ingestion, recreational misadventure.  Independent historian: EMS  External data reviewed: No pertinent external data  Labs: Ordered, Independent interpretation, and Details: CBC with slight leukocytosis to 12.5.  This is similar to typical baseline WBC.  Stable anemia and no thrombocytopenia. BMP with stable renal dysfunction on comparison to prior.  No emergent electrolyte derangement.  UDS positive for  benzodiazepine  Radiology: Not indicated  EKG/Medicine tests: Ordered and Independent interpretation EKG Interpretation: Sinus or ectopic atrial rhythm Nonspecific intraventricular conduction delay, which has been previously demonstrated  No significant change since last tracing  Confirmed by Rogelia Satterfield ([id number]) on 03/06/2024 4:36:34 PM  Interventions: None  See the EMR for full details regarding lab and imaging results.  Patient presents with episode of unresponsiveness that improved with Narcan  prior to arrival.  Patient WNL with GCS 15 on exam.  Ultimately admits that he took Klonopin  that was given to him by a another resident.  Screening labs obtained and reassuring.  UDS obtained given patient initially did not reveal what medication he took and positive only for benzodiazepines, this is less consistent with response to Narcan , as benzodiazepines are not responsive to Narcan .  Patient notably opioid negative on UDS.  Additionally patient with green urine on UDS.  Discussed case with poison control who after looking into patient's home medication, notes that occasionally senna can cause green discoloration.  Serial reevaluations performed and patient maintained appropriate mental status, therefore do not feel that patient requires admission or further workup.  Advised against sharing medications with other people and/or taking recreational substances, discharged, pending transportation back to facility at the time of signout.    Presentation is most consistent with acute complicated illness, Current presentation is complicated by underlying chronic conditions, and I did consider and rule out acute life/limb-threatening illness  Discussion of management or test interpretations with external provider(s): Poison control  Risk Drugs:None  Disposition: DISCHARGE: I believe that the patient is safe for discharge home with outpatient follow-up. Patient was informed of all pertinent  physical exam, laboratory, and imaging findings. Patient's suspected etiology of their symptom presentation was discussed with the patient and all questions were answered. We discussed following up with PCP. I provided thorough ED return precautions. The patient feels safe and comfortable with this plan.  MDM generated using voice dictation software and may contain dictation errors.  Please contact me for any clarification or with any questions.  Clinical Impression:  1. Overdose of undetermined intent, initial encounter  Discharge   Final Clinical Impression(s) / ED Diagnoses Final diagnoses:  Overdose of undetermined intent, initial encounter    Rx / DC Orders ED Discharge Orders     None        Rogelia Jerilynn RAMAN, MD 03/07/24 [phone]

## 2024-03-06 NOTE — Discharge Instructions (Signed)
 Gregory [name] Bring  Thank you for allowing us  to take care of you today.  You came to the Emergency Department today because you were unresponsive after reportedly taking Klonopin  from a friend.  You got better after Narcan .  Here in the emergency department you are well-appearing, your labs are normal.  Your urine drug screen was positive for the benzodiazepines.  We monitored you and you continue to be well.  We recommend against taking any medications that are not prescribed to you.  To-Do: 1. Please follow-up with your primary doctor within 2 days / as soon as possible.   Please return to the Emergency Department or call 911 if you experience have worsening of your symptoms, or do not get better, chest pain, shortness of breath, severe or significantly worsening pain, high fever, severe confusion, pass out or have any reason to think that you need emergency medical care.   We hope you feel better soon.   Mitzie Later, MD Department of Emergency Medicine [facility]

## 2024-03-06 NOTE — ED Triage Notes (Signed)
 Patient present to the ER via EMS from [facility] center when patient was found unresponsive. Per EMS patient took an unknown substance and was given Narcan . Patient awake and agitated in ER. Patient denies taking anything and states that he wants to go home. Patient report abdominal and back pain.

## 2024-03-06 NOTE — ED Notes (Signed)
 [facility] called

## 2024-03-06 NOTE — ED Notes (Signed)
 Patient yelling in ER to call his daughter [name]. RN called Psychiatric nurse. [name] was aware of patient being in the ER. She continued to report that she is not coming to the ER. [name] agreed to talk to her father on the phone. Ronal, RN tech called [name] back from ER treatment room.

## 2024-03-07 NOTE — ED Notes (Signed)
 2nd call for [facility]

## 2024-03-07 NOTE — ED Notes (Signed)
 Attempted to call [facility] center for rehabilitation and nursing multiple times with no answer.

## 2024-03-10 ENCOUNTER — Encounter (HOSPITAL_COMMUNITY): Payer: Self-pay

## 2024-03-10 ENCOUNTER — Other Ambulatory Visit: Payer: Self-pay

## 2024-03-10 ENCOUNTER — Emergency Department (HOSPITAL_COMMUNITY)

## 2024-03-10 ENCOUNTER — Emergency Department (HOSPITAL_COMMUNITY)
Admission: EM | Admit: 2024-03-10 | Discharge: 2024-03-11 | Disposition: A | Discharge status: Skilled Nursing Facility | Attending: Emergency Medicine | Admitting: Emergency Medicine

## 2024-03-10 ENCOUNTER — Ambulatory Visit: Admitting: Physician Assistant

## 2024-03-10 DIAGNOSIS — T402X1A Poisoning by other opioids, accidental (unintentional), initial encounter: Secondary | ICD-10-CM | POA: Insufficient documentation

## 2024-03-10 DIAGNOSIS — Z79899 Other long term (current) drug therapy: Secondary | ICD-10-CM | POA: Diagnosis not present

## 2024-03-10 DIAGNOSIS — T17908A Unspecified foreign body in respiratory tract, part unspecified causing other injury, initial encounter: Secondary | ICD-10-CM

## 2024-03-10 DIAGNOSIS — T17820A Food in other parts of respiratory tract causing asphyxiation, initial encounter: Secondary | ICD-10-CM | POA: Insufficient documentation

## 2024-03-10 DIAGNOSIS — T40601A Poisoning by unspecified narcotics, accidental (unintentional), initial encounter: Secondary | ICD-10-CM

## 2024-03-10 DIAGNOSIS — Y844 Aspiration of fluid as the cause of abnormal reaction of the patient, or of later complication, without mention of misadventure at the time of the procedure: Secondary | ICD-10-CM | POA: Insufficient documentation

## 2024-03-10 DIAGNOSIS — Z7982 Long term (current) use of aspirin: Secondary | ICD-10-CM | POA: Insufficient documentation

## 2024-03-10 DIAGNOSIS — R464 Slowness and poor responsiveness: Secondary | ICD-10-CM | POA: Insufficient documentation

## 2024-03-10 DIAGNOSIS — Z91040 Latex allergy status: Secondary | ICD-10-CM | POA: Diagnosis not present

## 2024-03-10 DIAGNOSIS — R4182 Altered mental status, unspecified: Secondary | ICD-10-CM | POA: Diagnosis not present
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
## 2024-03-11 ENCOUNTER — Emergency Department (HOSPITAL_COMMUNITY)

## 2024-03-11 LAB — CBC WITH DIFFERENTIAL/PLATELET
Abs Immature Granulocytes: 0.05 K/uL (ref 0.00–0.07)
Basophils Absolute: 0.1 K/uL (ref 0.0–0.1)
Basophils Relative: 1 %
Eosinophils Absolute: 0 K/uL (ref 0.0–0.5)
Eosinophils Relative: 0 %
HCT: 30 % — ABNORMAL LOW (ref 39.0–52.0)
Hemoglobin: 9.4 g/dL — ABNORMAL LOW (ref 13.0–17.0)
Immature Granulocytes: 1 %
Lymphocytes Relative: 29 %
Lymphs Abs: 2.6 K/uL (ref 0.7–4.0)
MCH: 29.4 pg (ref 26.0–34.0)
MCHC: 31.3 g/dL (ref 30.0–36.0)
MCV: 93.8 fL (ref 80.0–100.0)
Monocytes Absolute: 0.6 K/uL (ref 0.1–1.0)
Monocytes Relative: 7 %
Neutro Abs: 5.6 K/uL (ref 1.7–7.7)
Neutrophils Relative %: 62 %
Platelets: 260 K/uL (ref 150–400)
RBC: 3.2 MIL/uL — ABNORMAL LOW (ref 4.22–5.81)
RDW: 15.9 % — ABNORMAL HIGH (ref 11.5–15.5)
WBC: 9 K/uL (ref 4.0–10.5)
nRBC: 0 % (ref 0.0–0.2)

## 2024-03-11 LAB — COMPREHENSIVE METABOLIC PANEL WITH GFR
ALT: 10 U/L (ref 0–44)
AST: 16 U/L (ref 15–41)
Albumin: 2.7 g/dL — ABNORMAL LOW (ref 3.5–5.0)
Alkaline Phosphatase: 87 U/L (ref 38–126)
Anion gap: 13 (ref 5–15)
BUN: 26 mg/dL — ABNORMAL HIGH (ref 8–23)
CO2: 20 mmol/L — ABNORMAL LOW (ref 22–32)
Calcium: 8.9 mg/dL (ref 8.9–10.3)
Chloride: 105 mmol/L (ref 98–111)
Creatinine, Ser: 2.23 mg/dL — ABNORMAL HIGH (ref 0.61–1.24)
GFR, Estimated: 32 mL/min — ABNORMAL LOW (ref >60)
Glucose, Bld: 90 mg/dL (ref 70–99)
Potassium: 5.4 mmol/L — ABNORMAL HIGH (ref 3.5–5.1)
Sodium: 138 mmol/L (ref 135–145)
Total Bilirubin: 0.9 mg/dL (ref 0.0–1.2)
Total Protein: 6.6 g/dL (ref 6.5–8.1)

## 2024-03-11 LAB — I-STAT CG4 LACTIC ACID, ED: Lactic Acid, Venous: 0.9 mmol/L (ref 0.5–1.9)

## 2024-03-11 LAB — CK: Total CK: 71 U/L (ref 49–397)

## 2024-03-11 LAB — ETHANOL: Alcohol, Ethyl (B): <15 mg/dL (ref <15)

## 2024-03-11 LAB — AMMONIA: Ammonia: <13 umol/L (ref 9–35)

## 2024-03-11 LAB — ACETAMINOPHEN LEVEL: Acetaminophen (Tylenol), Serum: <10 ug/mL — ABNORMAL LOW (ref 10–30)

## 2024-03-11 LAB — SALICYLATE LEVEL: Salicylate Lvl: <7 mg/dL — ABNORMAL LOW (ref 7.0–30.0)

## 2024-03-11 MED ORDER — AMOXICILLIN-POT CLAVULANATE 875-125 MG PO TABS: 1.0000 | ORAL_TABLET | Freq: Two times a day (BID) | ORAL | 0 refills | Status: DC

## 2024-03-11 MED ORDER — ZIPRASIDONE MESYLATE 20 MG IM SOLR
20.0000 mg | Freq: Once | INTRAMUSCULAR | Status: AC
  Administered 2024-03-11: 20 mg via INTRAMUSCULAR
  Filled 2024-03-11: qty 20

## 2024-03-11 MED ORDER — HALOPERIDOL LACTATE 5 MG/ML IJ SOLN
5.0000 mg | Freq: Once | INTRAMUSCULAR | Status: AC
  Administered 2024-03-11: 5 mg via INTRAVENOUS
  Filled 2024-03-11: qty 1

## 2024-03-11 NOTE — ED Notes (Signed)
 Patient denies pain and is resting comfortably.

## 2024-03-11 NOTE — ED Notes (Signed)
 Pt agitated and itching at skin, asking for oxycodone . Pt unknowingly removed ostomy bag. Supplied requested from central supply.

## 2024-03-11 NOTE — ED Provider Notes (Signed)
  Physical Exam    ED Course / MDM   Clinical Course as of 03/11/24 0904  Wed Mar 11, 2024  0710 Received sign out from Dr. Haze pending re-assessment. Presented with unintentional overdose of opioid [WS]  0900 Patient arousable, answers questions appropriately. has been stable in the emergency department.  Has not required any further doses of Narcan .  Reviewed testing which is overall reassuring.  Feel patient is stable for discharge back to his skilled nursing facility.  Chest x-ray demonstrated possible aspiration.  Has not been hypoxic here for coughing but will send prescription. [WS]    Clinical Course User Index [WS] Francesca Elsie CROME, MD   Medical Decision Making Amount and/or Complexity of Data Reviewed Labs: ordered. Radiology: ordered.  Risk Prescription drug management.         Francesca Elsie CROME, MD 03/11/24 [phone]

## 2024-03-11 NOTE — Discharge Instructions (Addendum)
 Please don't abuse opioids.  You may have aspirated when you were unconscious.  We have prescribed you an antibiotic to treat this.  Please return for any new or worsening symptoms.

## 2024-03-11 NOTE — ED Notes (Signed)
 [facility] arrived to transport patient back to [facility].

## 2024-03-11 NOTE — ED Notes (Signed)
 [facility] called for transport back to [facility]
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
## 2024-03-11 NOTE — ED Notes (Signed)
 Pt not in NV restraints at this time that RN took over care.

## 2024-03-16 LAB — CULTURE, BLOOD (ROUTINE X 2)
Culture: NO GROWTH
Culture: NO GROWTH

## 2024-03-24 ENCOUNTER — Emergency Department (HOSPITAL_COMMUNITY)
Admission: EM | Admit: 2024-03-24 | Discharge: 2024-03-24 | Disposition: A | Discharge status: Home / Self Care | Attending: Emergency Medicine | Admitting: Emergency Medicine

## 2024-03-24 ENCOUNTER — Other Ambulatory Visit: Payer: Self-pay

## 2024-03-24 ENCOUNTER — Encounter (HOSPITAL_COMMUNITY): Payer: Self-pay

## 2024-03-24 DIAGNOSIS — R112 Nausea with vomiting, unspecified: Secondary | ICD-10-CM | POA: Diagnosis not present

## 2024-03-24 DIAGNOSIS — R1084 Generalized abdominal pain: Secondary | ICD-10-CM | POA: Insufficient documentation

## 2024-03-24 LAB — COMPREHENSIVE METABOLIC PANEL WITH GFR
ALT: 8 U/L (ref 0–44)
AST: 12 U/L — ABNORMAL LOW (ref 15–41)
Albumin: 2.9 g/dL — ABNORMAL LOW (ref 3.5–5.0)
Alkaline Phosphatase: 75 U/L (ref 38–126)
Anion gap: 13 (ref 5–15)
BUN: 14 mg/dL (ref 8–23)
CO2: 20 mmol/L — ABNORMAL LOW (ref 22–32)
Calcium: 8.4 mg/dL — ABNORMAL LOW (ref 8.9–10.3)
Chloride: 98 mmol/L (ref 98–111)
Creatinine, Ser: 1.73 mg/dL — ABNORMAL HIGH (ref 0.61–1.24)
GFR, Estimated: 44 mL/min — ABNORMAL LOW (ref >60)
Glucose, Bld: 116 mg/dL — ABNORMAL HIGH (ref 70–99)
Potassium: 4 mmol/L (ref 3.5–5.1)
Sodium: 131 mmol/L — ABNORMAL LOW (ref 135–145)
Total Bilirubin: 0.5 mg/dL (ref 0.0–1.2)
Total Protein: 6.5 g/dL (ref 6.5–8.1)

## 2024-03-24 LAB — CBC
HCT: 30 % — ABNORMAL LOW (ref 39.0–52.0)
Hemoglobin: 9.8 g/dL — ABNORMAL LOW (ref 13.0–17.0)
MCH: 29.4 pg (ref 26.0–34.0)
MCHC: 32.7 g/dL (ref 30.0–36.0)
MCV: 90.1 fL (ref 80.0–100.0)
Platelets: 207 K/uL (ref 150–400)
RBC: 3.33 MIL/uL — ABNORMAL LOW (ref 4.22–5.81)
RDW: 15.4 % (ref 11.5–15.5)
WBC: 8.9 K/uL (ref 4.0–10.5)
nRBC: 0 % (ref 0.0–0.2)

## 2024-03-24 LAB — LIPASE, BLOOD: Lipase: 31 U/L (ref 11–51)

## 2024-03-24 MED ORDER — HYDROXYZINE HCL 25 MG PO TABS
25.0000 mg | ORAL_TABLET | Freq: Four times a day (QID) | ORAL | Status: DC | PRN
  Administered 2024-03-24: 25 mg via ORAL
  Filled 2024-03-24: qty 1

## 2024-03-24 MED ORDER — DICYCLOMINE HCL 20 MG PO TABS: 20.0000 mg | ORAL_TABLET | Freq: Four times a day (QID) | ORAL | Status: DC | PRN

## 2024-03-24 MED ORDER — ONDANSETRON 4 MG PO TBDP: 4.0000 mg | ORAL_TABLET | Freq: Four times a day (QID) | ORAL | Status: DC | PRN

## 2024-03-24 MED ORDER — NAPROXEN 250 MG PO TABS: 500.0000 mg | ORAL_TABLET | Freq: Two times a day (BID) | ORAL | Status: DC | PRN

## 2024-03-24 MED ORDER — LOPERAMIDE HCL 2 MG PO CAPS: 2.0000 mg | ORAL_CAPSULE | ORAL | Status: DC | PRN

## 2024-03-24 MED ORDER — DICYCLOMINE HCL 20 MG PO TABS: 20.0000 mg | ORAL_TABLET | Freq: Two times a day (BID) | ORAL | 0 refills | Status: AC

## 2024-03-24 MED ORDER — ACETAMINOPHEN 500 MG PO TABS
1000.0000 mg | ORAL_TABLET | Freq: Once | ORAL | Status: AC
  Administered 2024-03-24: 1000 mg via ORAL
  Filled 2024-03-24: qty 2

## 2024-03-24 MED ORDER — ONDANSETRON 4 MG PO TBDP: 4.0000 mg | ORAL_TABLET | Freq: Three times a day (TID) | ORAL | 0 refills | Status: DC | PRN

## 2024-03-24 MED ORDER — HYDROXYZINE HCL 25 MG PO TABS: 25.0000 mg | ORAL_TABLET | Freq: Four times a day (QID) | ORAL | 0 refills | Status: DC

## 2024-03-24 MED ORDER — LOPERAMIDE HCL 2 MG PO CAPS: 2.0000 mg | ORAL_CAPSULE | Freq: Four times a day (QID) | ORAL | 0 refills | Status: AC | PRN

## 2024-03-24 MED ORDER — METHOCARBAMOL 500 MG PO TABS
500.0000 mg | ORAL_TABLET | Freq: Three times a day (TID) | ORAL | Status: DC | PRN
  Administered 2024-03-24: 500 mg via ORAL
  Filled 2024-03-24: qty 1

## 2024-03-24 NOTE — ED Provider Notes (Signed)
 [facility]PT [facility] Emergency Department Provider Note MRN:  [id number]  Arrival date & time: 03/24/24     Chief Complaint   Abdominal Pain   History of Present Illness   Gregory [name] is a 63 y.o. year-old male presents to the ED with chief complaint of generalized abdominal cramps, nausea, vomiting, diarrhea.  States that he has been having symptoms for the past 2 to 3 days.  He thinks that he is withdrawing from opiates.  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  Was recently seen here in given Narcan .  States that currently he does not have abdominal pain now, but still feels nauseous.  History provided by patient.   Review of Systems  Pertinent positive and negative review of systems noted in HPI.    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, atraumatic   *Additional and/or pertinent findings included in MDM below  Diagnostic and Interventional Summary    EKG Interpretation Date/Time:    Ventricular Rate:    PR Interval:    QRS Duration:    QT Interval:    QTC Calculation:   R Axis:      Text Interpretation:         Labs Reviewed  COMPREHENSIVE METABOLIC PANEL WITH GFR - Abnormal; Notable for the following components:      Result Value   Sodium 131 (*)    CO2 20 (*)    Glucose, Bld 116 (*)    Creatinine, Ser 1.73 (*)    Calcium  8.4 (*)    Albumin  2.9 (*)    AST 12 (*)     GFR, Estimated 44 (*)    All other components within normal limits  CBC - Abnormal; Notable for the following components:   RBC 3.33 (*)    Hemoglobin 9.8 (*)    HCT 30.0 (*)    All other components within normal limits  LIPASE, BLOOD    No orders to display    Medications  dicyclomine (BENTYL) tablet 20 mg (has no administration in time range)  hydrOXYzine  (ATARAX ) tablet 25 mg (25 mg Oral Given 03/24/24 0343)  loperamide  (IMODIUM ) capsule 2-4 mg (has no administration in time range)  methocarbamol  (ROBAXIN ) tablet 500 mg (500 mg Oral Given 03/24/24 0349)  naproxen (NAPROSYN) tablet 500 mg (has no administration in time range)  ondansetron  (ZOFRAN -ODT) disintegrating tablet 4 mg (has no administration in time range)  acetaminophen  (TYLENOL ) tablet 1,000 mg (1,000 mg Oral Given 03/24/24 0211)     Procedures  /  Critical Care Procedures  ED Course and Medical Decision Making  I have reviewed the triage vital signs, the nursing notes, and pertinent available records from the EMR.  Social Determinants Affecting Complexity of Care: Patient has no clinically significant social determinants affecting this chief complaint..   ED Course:    Medical Decision Making Patient here with symptoms consistent  with withdrawal from opioids.  He states that he has had nausea and abdominal cramps.  States that he had been buying opioids from another patient at his facility.  He was seen here recently for opioid overdose and was treated with Narcan .  On my exam, he is alert and oriented.  He is not actively vomiting.  His labs look similar to or better than prior.  He is noted to be hypertensive, we will treat him with Catapres  detox medications for symptom control.  On reassessment, patient is requesting to be discharged back to his facility.    Amount and/or Complexity of Data Reviewed Labs: ordered.  Risk Prescription drug management.         Consultants: No consultations were needed  in caring for this patient.   Treatment and Plan: Emergency department workup does not suggest an emergent condition requiring admission or immediate intervention beyond  what has been performed at this time. The patient is safe for discharge and has  been instructed to return immediately for worsening symptoms, change in  symptoms or any other concerns    Final Clinical Impressions(s) / ED Diagnoses     ICD-10-CM   1. Generalized abdominal pain  R10.84       ED Discharge Orders          Ordered    ondansetron  (ZOFRAN -ODT) 4 MG disintegrating tablet  Every 8 hours PRN        03/24/24 0419    loperamide  (IMODIUM ) 2 MG capsule  4 times daily PRN        03/24/24 0419    hydrOXYzine  (ATARAX ) 25 MG tablet  Every 6 hours        03/24/24 0419    dicyclomine (BENTYL) 20 MG tablet  2 times daily        03/24/24 0419              Discharge Instructions Discussed with and Provided to Patient:   Discharge Instructions   None      Vicky Charleston, [name] 03/24/24 0547    Theadore Ozell HERO, MD 03/24/24 [phone]

## 2024-03-24 NOTE — ED Triage Notes (Addendum)
 Pt presents via EMS from [facility] c/o abd pain with N/V over the last 2-3 days. Also report right stump pain from previous AKA. EMS also reports pt recently taken off PO Oxycodone  due to buy additional narcotic from another individual at the SNF with an overdose requiring Narcan .   Pt denies abd pain currently. Reports N/V. Main complaint at this time is right stump pain.

## 2024-04-20 ENCOUNTER — Encounter: Payer: Self-pay | Admitting: Radiology

## 2024-04-29 NOTE — Telephone Encounter (Signed)
**  APPOINTMENT REQUEST**  Daughter [name] Are they asking to reschedule? No If yes, due to: N/A Is patient calling due to a recall? No Is this appointment to be scheduled with a resident? Yes Patient is requesting an appointment to be seen for:  Return in about 1 year (around 05/27/2024) for resident clinic for annual DM dfe.  Patient prefers: No Preference   Was the first available appointment offered at the time of call: No- Template unavailable for resident clinic  Caller declined offered appointment and/or nothing was available at time of call   Please return call to: [name] at [phone]  Is it okay to leave a detailed message: Yes  Caller has been made aware clinic has 5-7 business days to return call: Yes

## 2024-05-01 ENCOUNTER — Inpatient Hospital Stay (HOSPITAL_COMMUNITY)
Admission: EM | Admit: 2024-05-01 | Discharge: 2024-05-05 | DRG: 871 | Disposition: A | Discharge status: Skilled Nursing Facility | Attending: Emergency Medicine | Admitting: Emergency Medicine

## 2024-05-01 ENCOUNTER — Emergency Department (HOSPITAL_COMMUNITY)

## 2024-05-01 ENCOUNTER — Inpatient Hospital Stay (HOSPITAL_COMMUNITY)

## 2024-05-01 ENCOUNTER — Encounter (HOSPITAL_COMMUNITY): Payer: Self-pay | Admitting: *Deleted

## 2024-05-01 ENCOUNTER — Other Ambulatory Visit: Payer: Self-pay

## 2024-05-01 DIAGNOSIS — I130 Hypertensive heart and chronic kidney disease with heart failure and stage 1 through stage 4 chronic kidney disease, or unspecified chronic kidney disease: Secondary | ICD-10-CM | POA: Diagnosis present

## 2024-05-01 DIAGNOSIS — Z794 Long term (current) use of insulin: Secondary | ICD-10-CM | POA: Diagnosis not present

## 2024-05-01 DIAGNOSIS — F419 Anxiety disorder, unspecified: Secondary | ICD-10-CM | POA: Diagnosis present

## 2024-05-01 DIAGNOSIS — N1832 Chronic kidney disease, stage 3b: Secondary | ICD-10-CM | POA: Diagnosis present

## 2024-05-01 DIAGNOSIS — L89156 Pressure-induced deep tissue damage of sacral region: Secondary | ICD-10-CM | POA: Diagnosis present

## 2024-05-01 DIAGNOSIS — E872 Acidosis, unspecified: Secondary | ICD-10-CM | POA: Diagnosis present

## 2024-05-01 DIAGNOSIS — I5032 Chronic diastolic (congestive) heart failure: Secondary | ICD-10-CM | POA: Diagnosis present

## 2024-05-01 DIAGNOSIS — D649 Anemia, unspecified: Secondary | ICD-10-CM | POA: Diagnosis present

## 2024-05-01 DIAGNOSIS — R6520 Severe sepsis without septic shock: Secondary | ICD-10-CM

## 2024-05-01 DIAGNOSIS — Z932 Ileostomy status: Secondary | ICD-10-CM

## 2024-05-01 DIAGNOSIS — N179 Acute kidney failure, unspecified: Secondary | ICD-10-CM | POA: Diagnosis present

## 2024-05-01 DIAGNOSIS — L89326 Pressure-induced deep tissue damage of left buttock: Secondary | ICD-10-CM | POA: Diagnosis present

## 2024-05-01 DIAGNOSIS — E1169 Type 2 diabetes mellitus with other specified complication: Secondary | ICD-10-CM | POA: Diagnosis present

## 2024-05-01 DIAGNOSIS — R404 Transient alteration of awareness: Principal | ICD-10-CM

## 2024-05-01 DIAGNOSIS — Z781 Physical restraint status: Secondary | ICD-10-CM | POA: Diagnosis not present

## 2024-05-01 DIAGNOSIS — L03116 Cellulitis of left lower limb: Secondary | ICD-10-CM | POA: Diagnosis present

## 2024-05-01 DIAGNOSIS — L89316 Pressure-induced deep tissue damage of right buttock: Secondary | ICD-10-CM | POA: Diagnosis present

## 2024-05-01 DIAGNOSIS — E1122 Type 2 diabetes mellitus with diabetic chronic kidney disease: Secondary | ICD-10-CM | POA: Diagnosis present

## 2024-05-01 DIAGNOSIS — Z89422 Acquired absence of other left toe(s): Secondary | ICD-10-CM | POA: Diagnosis not present

## 2024-05-01 DIAGNOSIS — G9341 Metabolic encephalopathy: Secondary | ICD-10-CM | POA: Diagnosis present

## 2024-05-01 DIAGNOSIS — G894 Chronic pain syndrome: Secondary | ICD-10-CM | POA: Diagnosis present

## 2024-05-01 DIAGNOSIS — E8729 Other acidosis: Secondary | ICD-10-CM | POA: Insufficient documentation

## 2024-05-01 DIAGNOSIS — H5462 Unqualified visual loss, left eye, normal vision right eye: Secondary | ICD-10-CM | POA: Diagnosis present

## 2024-05-01 DIAGNOSIS — I509 Heart failure, unspecified: Secondary | ICD-10-CM | POA: Diagnosis not present

## 2024-05-01 DIAGNOSIS — Z792 Long term (current) use of antibiotics: Secondary | ICD-10-CM | POA: Diagnosis not present

## 2024-05-01 DIAGNOSIS — N183 Chronic kidney disease, stage 3 unspecified: Secondary | ICD-10-CM | POA: Diagnosis not present

## 2024-05-01 DIAGNOSIS — I110 Hypertensive heart disease with heart failure: Secondary | ICD-10-CM | POA: Diagnosis present

## 2024-05-01 DIAGNOSIS — D631 Anemia in chronic kidney disease: Secondary | ICD-10-CM | POA: Diagnosis not present

## 2024-05-01 DIAGNOSIS — R52 Pain, unspecified: Secondary | ICD-10-CM | POA: Diagnosis not present

## 2024-05-01 DIAGNOSIS — J449 Chronic obstructive pulmonary disease, unspecified: Secondary | ICD-10-CM | POA: Diagnosis present

## 2024-05-01 DIAGNOSIS — A419 Sepsis, unspecified organism: Principal | ICD-10-CM | POA: Diagnosis present

## 2024-05-01 DIAGNOSIS — R748 Abnormal levels of other serum enzymes: Secondary | ICD-10-CM | POA: Insufficient documentation

## 2024-05-01 DIAGNOSIS — Z1152 Encounter for screening for COVID-19: Secondary | ICD-10-CM | POA: Diagnosis not present

## 2024-05-01 DIAGNOSIS — Z79899 Other long term (current) drug therapy: Secondary | ICD-10-CM

## 2024-05-01 DIAGNOSIS — Z89611 Acquired absence of right leg above knee: Secondary | ICD-10-CM

## 2024-05-01 DIAGNOSIS — M7989 Other specified soft tissue disorders: Secondary | ICD-10-CM | POA: Diagnosis not present

## 2024-05-01 DIAGNOSIS — E785 Hyperlipidemia, unspecified: Secondary | ICD-10-CM | POA: Diagnosis present

## 2024-05-01 DIAGNOSIS — R9431 Abnormal electrocardiogram [ECG] [EKG]: Secondary | ICD-10-CM | POA: Insufficient documentation

## 2024-05-01 DIAGNOSIS — Z8673 Personal history of transient ischemic attack (TIA), and cerebral infarction without residual deficits: Secondary | ICD-10-CM

## 2024-05-01 DIAGNOSIS — R823 Hemoglobinuria: Secondary | ICD-10-CM | POA: Insufficient documentation

## 2024-05-01 DIAGNOSIS — E11628 Type 2 diabetes mellitus with other skin complications: Secondary | ICD-10-CM | POA: Diagnosis not present

## 2024-05-01 DIAGNOSIS — N400 Enlarged prostate without lower urinary tract symptoms: Secondary | ICD-10-CM | POA: Diagnosis present

## 2024-05-01 DIAGNOSIS — Z7982 Long term (current) use of aspirin: Secondary | ICD-10-CM | POA: Diagnosis not present

## 2024-05-01 DIAGNOSIS — I4581 Long QT syndrome: Secondary | ICD-10-CM | POA: Diagnosis present

## 2024-05-01 DIAGNOSIS — E869 Volume depletion, unspecified: Secondary | ICD-10-CM | POA: Diagnosis present

## 2024-05-01 DIAGNOSIS — I959 Hypotension, unspecified: Secondary | ICD-10-CM | POA: Diagnosis present

## 2024-05-01 DIAGNOSIS — M869 Osteomyelitis, unspecified: Secondary | ICD-10-CM | POA: Diagnosis present

## 2024-05-01 LAB — PROTIME-INR
INR: 1 (ref 0.8–1.2)
Prothrombin Time: 13.9 s (ref 11.4–15.2)

## 2024-05-01 LAB — CBC WITH DIFFERENTIAL/PLATELET
Abs Immature Granulocytes: 0.06 K/uL (ref 0.00–0.07)
Basophils Absolute: 0 K/uL (ref 0.0–0.1)
Basophils Relative: 0 %
Eosinophils Absolute: 0.1 K/uL (ref 0.0–0.5)
Eosinophils Relative: 1 %
HCT: 44.4 % (ref 39.0–52.0)
Hemoglobin: 13.5 g/dL (ref 13.0–17.0)
Immature Granulocytes: 1 %
Lymphocytes Relative: 8 %
Lymphs Abs: 1.1 K/uL (ref 0.7–4.0)
MCH: 29.9 pg (ref 26.0–34.0)
MCHC: 30.4 g/dL (ref 30.0–36.0)
MCV: 98.2 fL (ref 80.0–100.0)
Monocytes Absolute: 0.4 K/uL (ref 0.1–1.0)
Monocytes Relative: 3 %
Neutro Abs: 11.7 K/uL — ABNORMAL HIGH (ref 1.7–7.7)
Neutrophils Relative %: 87 %
Platelets: 189 K/uL (ref 150–400)
RBC: 4.52 MIL/uL (ref 4.22–5.81)
RDW: 14.6 % (ref 11.5–15.5)
WBC: 13.3 K/uL — ABNORMAL HIGH (ref 4.0–10.5)
nRBC: 0 % (ref 0.0–0.2)

## 2024-05-01 LAB — COMPREHENSIVE METABOLIC PANEL WITH GFR
ALT: 14 U/L (ref 0–44)
AST: 31 U/L (ref 15–41)
Albumin: 2.7 g/dL — ABNORMAL LOW (ref 3.5–5.0)
Alkaline Phosphatase: 82 U/L (ref 38–126)
Anion gap: 18 — ABNORMAL HIGH (ref 5–15)
BUN: 40 mg/dL — ABNORMAL HIGH (ref 8–23)
CO2: 21 mmol/L — ABNORMAL LOW (ref 22–32)
Calcium: 9.1 mg/dL (ref 8.9–10.3)
Chloride: 103 mmol/L (ref 98–111)
Creatinine, Ser: 3.72 mg/dL — ABNORMAL HIGH (ref 0.61–1.24)
GFR, Estimated: 17 mL/min — ABNORMAL LOW (ref >60)
Glucose, Bld: 103 mg/dL — ABNORMAL HIGH (ref 70–99)
Potassium: 4.6 mmol/L (ref 3.5–5.1)
Sodium: 142 mmol/L (ref 135–145)
Total Bilirubin: 0.4 mg/dL (ref 0.0–1.2)
Total Protein: 7.1 g/dL (ref 6.5–8.1)

## 2024-05-01 LAB — RAPID URINE DRUG SCREEN, HOSP PERFORMED
Amphetamines: NOT DETECTED
Barbiturates: NOT DETECTED
Benzodiazepines: NOT DETECTED
Cocaine: NOT DETECTED
Opiates: NOT DETECTED
Tetrahydrocannabinol: NOT DETECTED

## 2024-05-01 LAB — RESP PANEL BY RT-PCR (RSV, FLU A&B, COVID)  RVPGX2
Influenza A by PCR: NEGATIVE
Influenza B by PCR: NEGATIVE
Resp Syncytial Virus by PCR: NEGATIVE
SARS Coronavirus 2 by RT PCR: NEGATIVE

## 2024-05-01 LAB — URINALYSIS, W/ REFLEX TO CULTURE (INFECTION SUSPECTED)
Bilirubin Urine: NEGATIVE
Glucose, UA: NEGATIVE mg/dL
Ketones, ur: NEGATIVE mg/dL
Leukocytes,Ua: NEGATIVE
Nitrite: NEGATIVE
Protein, ur: >=300 mg/dL — AB
Specific Gravity, Urine: 1.018 (ref 1.005–1.030)
pH: 5 (ref 5.0–8.0)

## 2024-05-01 LAB — BRAIN NATRIURETIC PEPTIDE: B Natriuretic Peptide: 108.8 pg/mL — ABNORMAL HIGH (ref 0.0–100.0)

## 2024-05-01 LAB — GLUCOSE, CAPILLARY: Glucose-Capillary: 90 mg/dL (ref 70–99)

## 2024-05-01 LAB — I-STAT CG4 LACTIC ACID, ED: Lactic Acid, Venous: 1.5 mmol/L (ref 0.5–1.9)

## 2024-05-01 MED ORDER — METHADONE HCL 10 MG PO TABS
15.0000 mg | ORAL_TABLET | Freq: Three times a day (TID) | ORAL | Status: AC
Start: 2024-05-01 — End: ?
  Administered 2024-05-01 – 2024-05-05 (×11): 15 mg via ORAL
  Filled 2024-05-01 (×12): qty 2

## 2024-05-01 MED ORDER — DROPERIDOL 2.5 MG/ML IJ SOLN
2.0000 mg | Freq: Once | INTRAMUSCULAR | Status: AC
  Administered 2024-05-01: 2 mg via INTRAVENOUS
  Filled 2024-05-01: qty 2

## 2024-05-01 MED ORDER — DICYCLOMINE HCL 20 MG PO TABS
20.0000 mg | ORAL_TABLET | Freq: Two times a day (BID) | ORAL | Status: DC
  Administered 2024-05-01 – 2024-05-05 (×8): 20 mg via ORAL
  Filled 2024-05-01 (×9): qty 1

## 2024-05-01 MED ORDER — VANCOMYCIN VARIABLE DOSE PER UNSTABLE RENAL FUNCTION (PHARMACIST DOSING): Status: DC

## 2024-05-01 MED ORDER — LACTULOSE 10 GM/15ML PO SOLN
20.0000 g | Freq: Two times a day (BID) | ORAL | Status: DC
  Administered 2024-05-02 – 2024-05-05 (×5): 20 g via ORAL
  Filled 2024-05-01 (×6): qty 30

## 2024-05-01 MED ORDER — DOXEPIN HCL 25 MG PO CAPS
50.0000 mg | ORAL_CAPSULE | Freq: Every day | ORAL | Status: DC
  Filled 2024-05-01: qty 2
  Filled 2024-05-01: qty 1

## 2024-05-01 MED ORDER — ATORVASTATIN CALCIUM 80 MG PO TABS
80.0000 mg | ORAL_TABLET | Freq: Every day | ORAL | Status: DC
  Administered 2024-05-01 – 2024-05-03 (×3): 80 mg via ORAL
  Filled 2024-05-01 (×3): qty 1

## 2024-05-01 MED ORDER — LEVETIRACETAM 500 MG PO TABS
500.0000 mg | ORAL_TABLET | Freq: Two times a day (BID) | ORAL | Status: DC
  Administered 2024-05-01 – 2024-05-04 (×7): 500 mg via ORAL
  Filled 2024-05-01 (×7): qty 1

## 2024-05-01 MED ORDER — METHOCARBAMOL 500 MG PO TABS
500.0000 mg | ORAL_TABLET | Freq: Four times a day (QID) | ORAL | Status: DC | PRN
Start: 2024-05-01 — End: 2024-05-05
  Administered 2024-05-04 (×2): 500 mg via ORAL
  Filled 2024-05-01 (×2): qty 1

## 2024-05-01 MED ORDER — HEPARIN SODIUM (PORCINE) 5000 UNIT/ML IJ SOLN
5000.0000 [IU] | Freq: Three times a day (TID) | INTRAMUSCULAR | Status: DC
  Administered 2024-05-01 – 2024-05-05 (×12): 5000 [IU] via SUBCUTANEOUS
  Filled 2024-05-01 (×12): qty 1

## 2024-05-01 MED ORDER — VANCOMYCIN HCL IN DEXTROSE 1-5 GM/200ML-% IV SOLN: 1000.0000 mg | Freq: Once | INTRAVENOUS | Status: DC

## 2024-05-01 MED ORDER — ACETAMINOPHEN 500 MG PO TABS
1000.0000 mg | ORAL_TABLET | Freq: Three times a day (TID) | ORAL | Status: DC
  Administered 2024-05-02 – 2024-05-04 (×7): 1000 mg via ORAL
  Filled 2024-05-01 (×7): qty 2

## 2024-05-01 MED ORDER — ACETAMINOPHEN 500 MG PO TABS: 1000.0000 mg | ORAL_TABLET | Freq: Three times a day (TID) | ORAL | Status: DC

## 2024-05-01 MED ORDER — SODIUM CHLORIDE 0.9 % IV SOLN: 2.0000 g | Freq: Once | INTRAVENOUS | Status: DC

## 2024-05-01 MED ORDER — LACTATED RINGERS IV BOLUS (SEPSIS)
1000.0000 mL | Freq: Once | INTRAVENOUS | Status: AC
  Administered 2024-05-01: 1000 mL via INTRAVENOUS

## 2024-05-01 MED ORDER — ALBUTEROL SULFATE (2.5 MG/3ML) 0.083% IN NEBU
2.5000 mg | INHALATION_SOLUTION | Freq: Four times a day (QID) | RESPIRATORY_TRACT | Status: DC | PRN
  Administered 2024-05-02: 2.5 mg via RESPIRATORY_TRACT
  Filled 2024-05-01: qty 3

## 2024-05-01 MED ORDER — TAMSULOSIN HCL 0.4 MG PO CAPS
0.4000 mg | ORAL_CAPSULE | Freq: Every day | ORAL | Status: DC
  Administered 2024-05-01 – 2024-05-05 (×5): 0.4 mg via ORAL
  Filled 2024-05-01 (×5): qty 1

## 2024-05-01 MED ORDER — OXYCODONE HCL 5 MG PO TABS: 5.0000 mg | ORAL_TABLET | Freq: Four times a day (QID) | ORAL | Status: DC | PRN

## 2024-05-01 MED ORDER — QUETIAPINE 12.5 MG HALF TABLET
25.0000 mg | ORAL_TABLET | Freq: Two times a day (BID) | ORAL | Status: DC
  Administered 2024-05-01 – 2024-05-02 (×3): 25 mg via ORAL
  Filled 2024-05-01 (×3): qty 2

## 2024-05-01 MED ORDER — SODIUM CHLORIDE 0.9 % IV SOLN: 2.0000 g | INTRAVENOUS | Status: DC

## 2024-05-01 MED ORDER — INSULIN ASPART 100 UNIT/ML IJ SOLN: 0.0000 [IU] | Freq: Three times a day (TID) | INTRAMUSCULAR | Status: DC

## 2024-05-01 MED ORDER — LACTATED RINGERS IV BOLUS (SEPSIS): 1000.0000 mL | Freq: Once | INTRAVENOUS | Status: DC

## 2024-05-01 MED ORDER — LACTATED RINGERS IV BOLUS (SEPSIS): 500.0000 mL | Freq: Once | INTRAVENOUS | Status: DC

## 2024-05-01 MED ORDER — FUROSEMIDE 20 MG PO TABS
20.0000 mg | ORAL_TABLET | Freq: Every day | ORAL | Status: DC
  Administered 2024-05-01: 20 mg via ORAL
  Filled 2024-05-01: qty 1

## 2024-05-01 MED ORDER — VANCOMYCIN HCL 1750 MG/350ML IV SOLN
1750.0000 mg | Freq: Once | INTRAVENOUS | Status: AC
  Administered 2024-05-01: 1750 mg via INTRAVENOUS
  Filled 2024-05-01: qty 350

## 2024-05-01 MED ORDER — SODIUM BICARBONATE 650 MG PO TABS
650.0000 mg | ORAL_TABLET | Freq: Two times a day (BID) | ORAL | Status: DC
  Administered 2024-05-01 – 2024-05-05 (×8): 650 mg via ORAL
  Filled 2024-05-01 (×8): qty 1

## 2024-05-01 MED ORDER — SALINE SPRAY 0.65 % NA SOLN
1.0000 | NASAL | Status: DC | PRN
  Administered 2024-05-04 – 2024-05-05 (×2): 1 via NASAL
  Filled 2024-05-01 (×2): qty 44

## 2024-05-01 MED ORDER — MIDAZOLAM HCL (PF) 2 MG/2ML IJ SOLN
2.0000 mg | Freq: Once | INTRAMUSCULAR | Status: AC
  Administered 2024-05-01: 2 mg via INTRAVENOUS
  Filled 2024-05-01: qty 2

## 2024-05-01 MED ORDER — ACETAMINOPHEN 500 MG PO TABS
1000.0000 mg | ORAL_TABLET | Freq: Once | ORAL | Status: AC
  Administered 2024-05-01: 1000 mg via ORAL
  Filled 2024-05-01: qty 2

## 2024-05-01 MED ORDER — METRONIDAZOLE 500 MG/100ML IV SOLN
500.0000 mg | Freq: Once | INTRAVENOUS | Status: AC
  Administered 2024-05-01: 500 mg via INTRAVENOUS
  Filled 2024-05-01: qty 100

## 2024-05-01 MED ORDER — ASPIRIN 81 MG PO TBEC
81.0000 mg | DELAYED_RELEASE_TABLET | Freq: Every day | ORAL | Status: DC
  Administered 2024-05-01 – 2024-05-05 (×5): 81 mg via ORAL
  Filled 2024-05-01 (×5): qty 1

## 2024-05-01 MED ORDER — OXCARBAZEPINE 150 MG PO TABS
150.0000 mg | ORAL_TABLET | Freq: Two times a day (BID) | ORAL | Status: DC
  Administered 2024-05-01 – 2024-05-05 (×8): 150 mg via ORAL
  Filled 2024-05-01 (×9): qty 1

## 2024-05-01 MED ORDER — INSULIN ASPART 100 UNIT/ML IJ SOLN: 0.0000 [IU] | Freq: Every day | INTRAMUSCULAR | Status: DC

## 2024-05-01 MED ORDER — FLUTICASONE PROPIONATE 50 MCG/ACT NA SUSP
1.0000 | Freq: Every day | NASAL | Status: DC
  Administered 2024-05-01 – 2024-05-05 (×5): 1 via NASAL
  Filled 2024-05-01 (×2): qty 16

## 2024-05-01 MED ORDER — METRONIDAZOLE 500 MG/100ML IV SOLN
500.0000 mg | Freq: Two times a day (BID) | INTRAVENOUS | Status: DC
  Administered 2024-05-02: 500 mg via INTRAVENOUS
  Filled 2024-05-01: qty 100

## 2024-05-01 MED ORDER — HALOPERIDOL LACTATE 5 MG/ML IJ SOLN
5.0000 mg | Freq: Once | INTRAMUSCULAR | Status: AC
  Administered 2024-05-01: 5 mg via INTRAVENOUS
  Filled 2024-05-01: qty 1

## 2024-05-01 MED ORDER — BUSPIRONE HCL 5 MG PO TABS
15.0000 mg | ORAL_TABLET | Freq: Three times a day (TID) | ORAL | Status: DC
  Administered 2024-05-01 – 2024-05-05 (×11): 15 mg via ORAL
  Filled 2024-05-01 (×11): qty 3

## 2024-05-01 MED ORDER — CLONAZEPAM 0.5 MG PO TABS
0.5000 mg | ORAL_TABLET | Freq: Three times a day (TID) | ORAL | Status: DC | PRN
  Administered 2024-05-01 – 2024-05-02 (×2): 0.5 mg via ORAL
  Filled 2024-05-01 (×3): qty 1

## 2024-05-01 NOTE — Progress Notes (Incomplete)
 Internal Medicine Teaching Service Attending Note Date: 05/01/2024  Patient name: Gregory [name]  Medical record number: [id number]  Date of birth: May 22, 1961   I have seen and evaluated Gregory [name] and discussed their care with the Residency Team.   63 yo M with hx of HTN, COPD, DM2 (since 63 yo), CHF, previous CVA, previous R BKA, prev GBS sepsis with osteomyelitis 12-2023, returned 02-2024 with cellulitis, BCx (-) but noted to have osteo and left prior to ortho eval.  He now comes in with 5 hrs of worsened mental status, difficulty with arousal from SNF.  In ED he remained somnolent. His ED course was notable for fever 102.9,the pt removing his ostomy bag and scatolia.  Currently  he is oriented x 3. States his L leg has been swollen for months.   Physical Exam: Blood pressure 121/60, pulse 88, temperature 98.5 F (36.9 C), temperature source Oral, resp. rate 16, height 6' (1.829 m), weight 85 kg, SpO2 94%. General appearance: alert, cooperative, no distress, and uncooperative Eyes: PERRL, blind in L eye Throat: abnormal findings: dry Neck: no adenopathy and supple, symmetrical, trachea midline Resp: clear to auscultation bilaterally Cardio: regular rate and rhythm GI: normal findings: bowel sounds normal and soft, non-tender Extremities: L leg swollen to knee, non-tender, scabbing and scaling, erythema.  Ostomy bag in place.   Lab results: Results for orders placed or performed during the hospital encounter of 05/01/24 (from the past 24 hours)  Resp panel by RT-PCR (RSV, Flu A&B, Covid) Anterior Nasal Swab     Status: None   Collection Time: 05/01/24  1:00 PM   Specimen: Anterior Nasal Swab  Result Value Ref Range   SARS Coronavirus 2 by RT PCR NEGATIVE NEGATIVE   Influenza A by PCR NEGATIVE NEGATIVE   Influenza B by PCR NEGATIVE NEGATIVE   Resp Syncytial Virus by PCR NEGATIVE NEGATIVE  Comprehensive metabolic panel     Status: Abnormal   Collection Time: 05/01/24  1:00 PM   Result Value Ref Range   Sodium 142 135 - 145 mmol/L   Potassium 4.6 3.5 - 5.1 mmol/L   Chloride 103 98 - 111 mmol/L   CO2 21 (L) 22 - 32 mmol/L   Glucose, Bld 103 (H) 70 - 99 mg/dL   BUN 40 (H) 8 - 23 mg/dL   Creatinine, Ser 6.27 (H) 0.61 - 1.24 mg/dL   Calcium  9.1 8.9 - 10.3 mg/dL   Total Protein 7.1 6.5 - 8.1 g/dL   Albumin  2.7 (L) 3.5 - 5.0 g/dL   AST 31 15 - 41 U/L   ALT 14 0 - 44 U/L   Alkaline Phosphatase 82 38 - 126 U/L   Total Bilirubin 0.4 0.0 - 1.2 mg/dL   GFR, Estimated 17 (L) >60 mL/min   Anion gap 18 (H) 5 - 15  CBC with Differential     Status: Abnormal   Collection Time: 05/01/24  1:00 PM  Result Value Ref Range   WBC 13.3 (H) 4.0 - 10.5 K/uL   RBC 4.52 4.22 - 5.81 MIL/uL   Hemoglobin 13.5 13.0 - 17.0 g/dL   HCT 55.5 60.9 - 47.9 %   MCV 98.2 80.0 - 100.0 fL   MCH 29.9 26.0 - 34.0 pg   MCHC 30.4 30.0 - 36.0 g/dL   RDW 85.3 88.4 - 84.4 %   Platelets 189 150 - 400 K/uL   nRBC 0.0 0.0 - 0.2 %   Neutrophils Relative % 87 %   Neutro  Abs 11.7 (H) 1.7 - 7.7 K/uL   Lymphocytes Relative 8 %   Lymphs Abs 1.1 0.7 - 4.0 K/uL   Monocytes Relative 3 %   Monocytes Absolute 0.4 0.1 - 1.0 K/uL   Eosinophils Relative 1 %   Eosinophils Absolute 0.1 0.0 - 0.5 K/uL   Basophils Relative 0 %   Basophils Absolute 0.0 0.0 - 0.1 K/uL   Immature Granulocytes 1 %   Abs Immature Granulocytes 0.06 0.00 - 0.07 K/uL  Protime-INR     Status: None   Collection Time: 05/01/24  1:00 PM  Result Value Ref Range   Prothrombin Time 13.9 11.4 - 15.2 seconds   INR 1.0 0.8 - 1.2  Brain natriuretic peptide     Status: Abnormal   Collection Time: 05/01/24  1:00 PM  Result Value Ref Range   B Natriuretic Peptide 108.8 (H) 0.0 - 100.0 pg/mL  Urinalysis, w/ Reflex to Culture (Infection Suspected) -Urine, Clean Catch     Status: Abnormal   Collection Time: 05/01/24  1:09 PM  Result Value Ref Range   Specimen Source URINE, CLEAN CATCH    Color, Urine GREEN (A) YELLOW   APPearance HAZY (A)  CLEAR   Specific Gravity, Urine 1.018 1.005 - 1.030   pH 5.0 5.0 - 8.0   Glucose, UA NEGATIVE NEGATIVE mg/dL   Hgb urine dipstick MODERATE (A) NEGATIVE   Bilirubin Urine NEGATIVE NEGATIVE   Ketones, ur NEGATIVE NEGATIVE mg/dL   Protein, ur >=699 (A) NEGATIVE mg/dL   Nitrite NEGATIVE NEGATIVE   Leukocytes,Ua NEGATIVE NEGATIVE   RBC / HPF 0-5 0 - 5 RBC/hpf   WBC, UA 11-20 0 - 5 WBC/hpf   Bacteria, UA RARE (A) NONE SEEN   Squamous Epithelial / HPF 0-5 0 - 5 /HPF   Mucus PRESENT   Urine Culture     Status: None (Preliminary result)   Collection Time: 05/01/24  1:09 PM   Specimen: Urine, Clean Catch  Result Value Ref Range   Specimen Description URINE, CLEAN CATCH    Special Requests      NONE Reflexed from [id number] Performed at [facility], [address], [place], [place] [address]    Culture PENDING    Report Status PENDING   I-Stat Lactic Acid, ED     Status: None   Collection Time: 05/01/24  1:20 PM  Result Value Ref Range   Lactic Acid, Venous 1.5 0.5 - 1.9 mmol/L  Rapid urine drug screen (hospital performed)     Status: None   Collection Time: 05/01/24  2:25 PM  Result Value Ref Range   Opiates NONE DETECTED NONE DETECTED   Cocaine NONE DETECTED NONE DETECTED   Benzodiazepines NONE DETECTED NONE DETECTED   Amphetamines NONE DETECTED NONE DETECTED   Tetrahydrocannabinol NONE DETECTED NONE DETECTED   Barbiturates NONE DETECTED NONE DETECTED    Imaging results:  DG Tibia/Fibula Left Result Date: 05/01/2024 EXAM: 2 VIEW(S) Xray of the Left Tibia and Fibula 05/01/2024 01:56:00 PM COMPARISON: None available. CLINICAL HISTORY: Known osteo Known osteo FINDINGS: BONES AND JOINTS: No acute fracture. No focal osseous lesion. No joint dislocation. SOFT TISSUES: The soft tissues are unremarkable. IMPRESSION: 1. No significant abnormality. Electronically signed by: Lynwood Seip MD 05/01/2024 02:27 PM EST RP Workstation: [id number]   DG Foot 2 Views Left Result Date:  05/01/2024 EXAM: 2 VIEW(S) XRAY OF THE LEFT FOOT 05/01/2024 01:56:00 PM COMPARISON: 02/23/2024. Only lateral image was submitted for review. 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 Osteomyelitis cannot be excluded due to the single limited image. No acute fracture. No joint dislocation. SOFT TISSUES: Vascular calcifications are noted. Severe dorsal soft tissue swelling is noted most consistent with infection. IMPRESSION: 1. Severe dorsal soft tissue swelling, suspicious for infection. Osteomyelitis cannot be excluded due to single limited lateral view. 2. 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 Electronically signed by: Lynwood Seip MD 05/01/2024 02:26 PM EST RP Workstation: [id number]   DG Chest Port 1 View Result Date: 05/01/2024 CLINICAL DATA:  Sepsis. EXAM: PORTABLE CHEST 1 VIEW COMPARISON:  03/10/2024 FINDINGS: The heart size and mediastinal contours are within normal limits. Low lung volumes with bibasilar atelectasis. There is no evidence of pulmonary edema, consolidation, pneumothorax or pleural fluid. The visualized skeletal structures are unremarkable. IMPRESSION: Low lung volumes with bibasilar atelectasis. Electronically Signed   By: Marcey Moan M.D.   On: 05/01/2024 14:14    Assessment and Plan: I agree with the formulated Assessment and Plan with the following changes:  Sepsis (fever, leukocytosis, aki, suspected osteo on L foot on plain film) Cellulitis AKI  CT LE pending Continue broad spectrum anbx Will f/u BCx Hydrate as he tolerates SSI    Eben, Reyes BROCKS, MD

## 2024-05-01 NOTE — ED Notes (Signed)
 Report called to 6N

## 2024-05-01 NOTE — Hospital Course (Addendum)
 Sepsis likely 2/2 L leg Cellulitis   Pt was admitted for sepsis from L leg cellulitis which responded well to Zosyn  and Vancomycin . Sepsis quickly resolved and pt was deescalated to Ancef , and then *** for PO antibiotic treatment. He was then able to be transferred to ***.   Acute Encephalopathic Pt was confused for much of 11/16, thought to be from delirium from infection. Fluids were maintained, BP was managed, and pt was back to baseline on 11/17 and did not*** have another encephalopathic event.   AKI on CKD3 Azotemia [name] Cr was 3.72 on admission from a presumed baseline of 1.73 one month prior. Improvement back to ***.  Normocytic Anemia  Pt Hgb on admission as 13.5, which was suspected to be heme concentrated, and stabilized around ***. There were no clear signs of bleeding, and he was monitored throughout his stay.  T2DM On a sliding scale at home. A1c 5.9 two months ago.  -continue sliding scale    Congestive HF Pt has a history of Congestive HF, but he appeared volume down so we held his Lasix  and repleted his fluids. He responded well to this and did not** have any HF exacerbation while admitted.    =-==== pick back up below this line ====  #Ileostomy Patient removed ostomy bag in ED. Denies recent high output.  -wound ostomy consulted  -Dicyclomine 20 mg  -lactulose     #COPD Denies increase sputum production or cough. Does have bothersome congestion. RVP neg. Was saturating well on RA, eventually did have [place] on although I don't think this was necessary.  Low concern for exacerbation at this time.  -albuterol  -fluticasone     Qtc Prolongation  612 ms on admission. Likely from home methadone   -Avoid QTC prolonging medications -Continuous cardiac monitor  612 ms on admission, today .    Chronic pain and anxiety  Per chart, Hx of multiple hospitalizations due to opioid overdose. Exam not concerning for acute overdose or withdrawal.  -home methadone  15  mg -oxycodone  5 mg -Buspirone  15 mg TID -Clonazepam  0.5 mg TID PRN -methocarbamol  500 mg Q6HR PRN -Hold on Doxepin  due to Qtc   Hx of stroke -Continue ASA 81 mg -atorvastatin  80 mg daily   Hx of seizures 11/2023 -Keppra  500 mg BID -Oxcarbazepine  150 mg BID

## 2024-05-01 NOTE — ED Provider Notes (Signed)
  EMERGENCY DEPARTMENT AT [facility] Provider Note   CSN: [id number] Arrival date & time: 05/01/24  1242     Patient presents with: Altered Mental Status   Gregory [name] is a 63 y.o. male with history of hypertension, COPD, diabetes, CHF, stroke, right BKA, left toe amputation, with known osteomyelitis presenting to the Emergency Department for confusion and altered mental status.  Per EMS, patient was last known normal around 7 AM.  On reevaluation, patient was found to be more somnolent difficult to arouse.  Patient was found in his wheelchair, no history of fall or head injury. Patient has had multiple recent admissions for bacteremia and osteomyelitis.  On arrival, patient was somnolent and not responding to questions therefore the remainder of history was limited due to patient's altered mental status.  Per chart review, back in September patient was admitted for sepsis found to have osteomyelitis of his left lower extremity and he left prior to orthopedic evaluation refusing orthopedic intervention.   HPI     Prior to Admission medications   Medication Sig Start Date End Date Taking? Authorizing Provider  acetaminophen  (TYLENOL ) 325 MG tablet Take 2 tablets (650 mg total) by mouth every 6 (six) hours as needed for mild pain (pain score 1-3) (or Fever >/= 101). 09/08/23   Elgergawy, Brayton RAMAN, MD  albuterol  (PROVENTIL ) (2.5 MG/3ML) 0.083% nebulizer solution Take 2.5 mg by nebulization every 6 (six) hours as needed for wheezing or shortness of breath.    [provider]  albuterol  (VENTOLIN  HFA) 108 (90 Base) MCG/ACT inhaler Inhale 2 puffs into the lungs every 6 (six) hours as needed for wheezing or shortness of breath.    [provider]  amoxicillin -clavulanate (AUGMENTIN ) 875-125 MG tablet Take 1 tablet by mouth every 12 (twelve) hours. 03/11/24   Francesca Elsie LITTIE, MD  aspirin  EC 81 MG tablet Take 81 mg by mouth daily.    [provider]   atorvastatin  (LIPITOR ) 80 MG tablet Take 1 tablet (80 mg total) by mouth daily. 02/28/24 03/29/24  Darci Pore, MD  busPIRone  (BUSPAR ) 10 MG tablet Take 10 mg by mouth 3 (three) times daily.    [provider]  Calcium  Carbonate Antacid 648 MG TABS Take 2 tablets by mouth every 8 (eight) hours as needed (Indigestion).    [provider]  Cholecalciferol  25 MCG (1000 UT) capsule Take 2,000 Units by mouth daily.    [provider]  dicyclomine (BENTYL) 20 MG tablet Take 1 tablet (20 mg total) by mouth 2 (two) times daily. 03/24/24   Vicky Charleston, [name]  docusate sodium  (COLACE) 100 MG capsule Take 100 mg by mouth every 12 (twelve) hours.    [provider]  doxepin  (SINEQUAN ) 25 MG capsule Take 50 mg by mouth at bedtime.    [provider]  ferrous sulfate  325 (65 FE) MG tablet Take 1 tablet (325 mg total) by mouth 3 (three) times daily with meals. 01/23/23   Bernard Drivers, MD  hydrOXYzine  (ATARAX ) 25 MG tablet Take 1 tablet (25 mg total) by mouth every 6 (six) hours. 03/24/24   Vicky Charleston, [name]  insulin  lispro (HUMALOG ) 100 UNIT/ML KwikPen Inject 0-10 Units into the skin See admin instructions. Inject 0-10 units into the skin three times a day and at bedtime, PER SLIDING SCALE:  BGL 151-200 = 2 units 201-250 = 4 units 251-300 = 6 units 301-350 =  8 units 351-400 = 10 units 01/31/22   Arlice Reichert, MD  JOURNAVX  50 MG TABS Take 1 tablet by mouth 2 (two) times daily. 01/22/24   [provider]  lactulose  (CHRONULAC ) 10 GM/15ML solution Take 30 mLs (20 g total) by mouth 2 (two) times daily. 08/19/23   Samtani, Jai-Gurmukh, MD  levETIRAcetam  (KEPPRA ) 500 MG tablet Take 1 tablet (500 mg total) by mouth 2 (two) times daily. 12/03/23 03/02/24  Jerrol Agent, MD  lidocaine  4 % Place 1 patch onto the skin daily. Apply 1 new patch to the affected area and remove 12 hours later    [provider]  loperamide  (IMODIUM ) 2 MG capsule Take 1  capsule (2 mg total) by mouth 4 (four) times daily as needed for diarrhea or loose stools. 03/24/24   Vicky Charleston, [name]  magnesium  oxide (MAG-OX) 400 (240 Mg) MG tablet Take 400 mg by mouth 3 (three) times daily.    [provider]  Melatonin 10 MG TABS Take 10 mg by mouth at bedtime.    [provider]  Meth-Hyo-M Bl-Na Phos-Ph Sal (URO-MP) 118 MG CAPS Take 118 mg by mouth 2 (two) times daily.    [provider]  methocarbamol  (ROBAXIN ) 500 MG tablet Take 500 mg by mouth every 6 (six) hours as needed for muscle spasms.    [provider]  Multiple Vitamins-Minerals (MULTIVITAMIN WITH MINERALS) tablet Take 1 tablet by mouth daily.    [provider]  naloxone  (NARCAN ) 0.4 MG/ML injection Inject 0.4 mg into the muscle as needed (for oversedation).    [provider]  naloxone  (NARCAN ) nasal spray 4 mg/0.1 mL Place 1 spray into the nose See admin instructions. Instill 1 spray into alternating nostrils every 5 minutes as needed for an opiate overdose    [provider]  ondansetron  (ZOFRAN ) 4 MG tablet Take 4 mg by mouth every 6 (six) hours as needed for nausea or vomiting.    [provider]  ondansetron  (ZOFRAN -ODT) 4 MG disintegrating tablet Take 1 tablet (4 mg total) by mouth every 8 (eight) hours as needed for nausea or vomiting. 03/24/24   Vicky Charleston, [name]  OXcarbazepine  (TRILEPTAL ) 150 MG tablet Take 1 tablet (150 mg total) by mouth 2 (two) times daily. 08/19/23   Samtani, Jai-Gurmukh, MD  pregabalin  (LYRICA ) 200 MG capsule Take 200 mg by mouth 2 (two) times daily.    [provider]  QUEtiapine  (SEROQUEL ) 25 MG tablet Take 1 tablet (25 mg total) by mouth at bedtime and may repeat dose one time if needed. 02/28/24   Darci Pore, MD  senna (SENOKOT) 8.6 MG TABS tablet Take 2 tablets by mouth at bedtime.    [provider]  sodium bicarbonate  650 MG tablet Take 650 mg by mouth 2 (two) times  daily.    [provider]  tamsulosin  (FLOMAX ) 0.4 MG CAPS capsule Take 1 capsule (0.4 mg total) by mouth daily. 08/19/23   Samtani, Jai-Gurmukh, MD    Allergies: Latex    Review of Systems  Updated Vital Signs BP 134/84 (BP Location: Left Arm)   Pulse (!) 103   Temp (!) 102.9 F (39.4 C) (Axillary)   Resp 20   Ht 6' (1.829 m)   Wt 85 kg   SpO2 95%   BMI 25.41 kg/m   Physical Exam Vitals and nursing note reviewed.  Constitutional:      Appearance: He is well-developed. He is ill-appearing.  HENT:     Head: Normocephalic and atraumatic.  Eyes:     Conjunctiva/sclera: Conjunctivae normal.  Pupils: Pupils are equal, round, and reactive to light.  Neck:     Comments: Stiff and in flexed position secondary to spinal fusion Cardiovascular:     Rate and Rhythm: Regular rhythm. Tachycardia present.     Heart sounds: No murmur heard. Pulmonary:     Effort: Pulmonary effort is normal. No respiratory distress.     Breath sounds: Rhonchi present.     Comments: Tachypnea, patient saturating 100% on room air, protecting his airway Abdominal:     Palpations: Abdomen is soft.     Tenderness: There is no abdominal tenderness.  Musculoskeletal:     Comments: Right lower extremity AKA, left lower extremity with significant erythema and edema.  Left foot is pale with multiple toe amputations.  Patient with dopplerable pulses.  Skin:    General: Skin is warm and dry.     Capillary Refill: Capillary refill takes less than 2 seconds.  Neurological:     General: No focal deficit present.     Mental Status: He is alert.     Motor: No weakness.     Comments: Patient moving all extremities and following commands     (all labs ordered are listed, but only abnormal results are displayed) Labs Reviewed  COMPREHENSIVE METABOLIC PANEL WITH GFR - Abnormal; Notable for the following components:      Result Value   CO2 21 (*)    Glucose, Bld 103 (*)    BUN 40 (*)    Creatinine,  Ser 3.72 (*)    Albumin  2.7 (*)    GFR, Estimated 17 (*)    Anion gap 18 (*)    All other components within normal limits  CBC WITH DIFFERENTIAL/PLATELET - Abnormal; Notable for the following components:   WBC 13.3 (*)    Neutro Abs 11.7 (*)    All other components within normal limits  BRAIN NATRIURETIC PEPTIDE - Abnormal; Notable for the following components:   B Natriuretic Peptide 108.8 (*)    All other components within normal limits  RESP PANEL BY RT-PCR (RSV, FLU A&B, COVID)  RVPGX2  CULTURE, BLOOD (ROUTINE X 2)  CULTURE, BLOOD (ROUTINE X 2)  PROTIME-INR  RAPID URINE DRUG SCREEN, HOSP PERFORMED  URINALYSIS, W/ REFLEX TO CULTURE (INFECTION SUSPECTED)  I-STAT CG4 LACTIC ACID, ED    EKG: EKG Interpretation Date/Time:  Friday May 01 2024 12:53:29 EST Ventricular Rate:  106 PR Interval:    QRS Duration:  108 QT Interval:  331 QTC Calculation: 440 R Axis:   68  Text Interpretation: sinus with artifact Borderline T abnormalities, anterior leads Minimal ST elevation, lateral leads Artifact in lead(s) I III aVR aVL V2 when compared to prior, more artifact and faster rate No STEMI Confirmed by Ginger Barefoot ([id number]) on 05/01/2024 1:02:05 PM  Radiology: [facility]COLA Tibia/Fibula Left Result Date: 05/01/2024 EXAM: 2 VIEW(S) Xray of the Left Tibia and Fibula 05/01/2024 01:56:00 PM COMPARISON: None available. CLINICAL HISTORY: Known osteo Known osteo FINDINGS: BONES AND JOINTS: No acute fracture. No focal osseous lesion. No joint dislocation. SOFT TISSUES: The soft tissues are unremarkable. IMPRESSION: 1. No significant abnormality. Electronically signed by: Lynwood Seip MD 05/01/2024 02:27 PM EST RP Workstation: [id number]   DG Foot 2 Views Left Result Date: 05/01/2024 EXAM: 2 VIEW(S) XRAY OF THE LEFT FOOT 05/01/2024 01:56:00 PM COMPARISON: 02/23/2024. Only lateral image was submitted for review. CLINICAL HISTORY: known osteo known osteo FINDINGS: BONES AND JOINTS: Status post amputation  of distal portions of first and second metatarsals and phalanges.  Osteomyelitis cannot be excluded due to the single limited image. No acute fracture. No joint dislocation. SOFT TISSUES: Vascular calcifications are noted. Severe dorsal soft tissue swelling is noted most consistent with infection. IMPRESSION: 1. Severe dorsal soft tissue swelling, suspicious for infection. Osteomyelitis cannot be excluded due to single limited lateral view. 2. 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 Electronically signed by: Lynwood Seip MD 05/01/2024 02:26 PM EST RP Workstation: [id number]   DG Chest Port 1 View Result Date: 05/01/2024 CLINICAL DATA:  Sepsis. EXAM: PORTABLE CHEST 1 VIEW COMPARISON:  03/10/2024 FINDINGS: The heart size and mediastinal contours are within normal limits. Low lung volumes with bibasilar atelectasis. There is no evidence of pulmonary edema, consolidation, pneumothorax or pleural fluid. The visualized skeletal structures are unremarkable. IMPRESSION: Low lung volumes with bibasilar atelectasis. Electronically Signed   By: Marcey Moan M.D.   On: 05/01/2024 14:14     Procedures   Medications Ordered in the ED  ceFEPIme  (MAXIPIME ) 2 g in sodium chloride  0.9 % 100 mL IVPB (has no administration in time range)  metroNIDAZOLE  (FLAGYL ) IVPB 500 mg (has no administration in time range)  vancomycin  (VANCOREADY) IVPB 1750 mg/350 mL (1,750 mg Intravenous New Bag/Given 05/01/24 1417)  lactated ringers  bolus 1,000 mL (1,000 mLs Intravenous New Bag/Given 05/01/24 1538)  acetaminophen  (TYLENOL ) tablet 1,000 mg (has no administration in time range)  lactated ringers  bolus 1,000 mL (0 mLs Intravenous Stopped 05/01/24 1522)  droperidol  (INAPSINE ) 2.5 MG/ML injection 2 mg (2 mg Intravenous Given 05/01/24 1518)    Clinical Course as of 05/01/24 1555  Fri May 01, 2024  1344 I-Stat Lactic Acid, ED No lactic acidosis [LB]  1345 CBC with  Differential(!) Mild leukocytosis, no anemia [LB]    Clinical Course User Index [LB] Sharlet Dowdy, MD                                 Medical Decision Making Patient is a 63 year old male with significant past medical history and multiple comorbidities presenting for altered mental status.  Per chart review patient has had multiple recent missions for similar with past diagnosis of bacteremia and osteomyelitis.  Patient left the hospital previously prior to orthopedic management of his left lower extremity osteomyelitis.  On evaluation, patient was  febrile and initially GCS 11.  He was following commands, saturating normally on room air, and breathing spontaneously.  Patient was maintaining his own airway.  Pupils were equal round reactive to light, no concern for opioid ingestion at this time.  Left lower extremity appears erythematous, edematous, with pallor to the foot. Patient with dopplerable DP pulse in his left lower extremity.  During my exam, patient became more awake and alert.  He was answering questions appropriately. He was moving all extremities without weakness, decreased sensation, or cranial nerve deficit.  Differential diagnosis includes but limited to sepsis, osteomyelitis, pneumonia, UTI, acute ejection, polysubstance, metabolic encephalopathy  Patient was found to be febrile, tachycardic, with a known source of infection which met SIRS criteria.  IV  fluid bolus was initiated, labs and 2 sets of blood cultures were collected,  and patient was initiated on a broad antibiotics with cefepime , vancomycin , and Flagyl  per their preferred protocol for severe  cellulitis/osteomyelitis.  Labs significant for leukocytosis, AKI, anion gap metabolic acidosis. UDS negative for acute ingestion.  Chest x-ray without evidence of consolidation or pneumonia.  X-rays of left foot  with significant dorsal swelling.  Patient requires admission for continued IV antibiotics and further evaluation of  cellulitis versus osteomyelitis.  Internal medicine was consulted who agreed to admit this patient.  Patient admitted to their team in stable condition.  Please see admitting providers note for the manger patient's care.  Amount and/or Complexity of Data Reviewed External Data Reviewed: radiology and notes. Labs: ordered. Decision-making details documented in ED Course. Radiology: ordered and independent interpretation performed.  Risk OTC drugs. Prescription drug management. Decision regarding hospitalization.        Final diagnoses:  Transient alteration of awareness  AKI (acute kidney injury)  Sepsis, due to unspecified organism, unspecified whether acute organ dysfunction present [facility])  Cellulitis of left lower extremity    ED Discharge Orders     None          Sharlet Dowdy, MD 05/01/24 1555    Tegeler, Lonni PARAS, MD 05/02/24 [phone]

## 2024-05-01 NOTE — Progress Notes (Signed)
 Elink following for sepsis protocol.

## 2024-05-01 NOTE — ED Notes (Signed)
 Rn and NT cleaned pt and changed bed pt placed back on the cardiac monitor. A new ostomy bag was placed on pt. New EKG obtained. Pt resting

## 2024-05-01 NOTE — Progress Notes (Addendum)
 Pharmacy Antibiotic Note  Gregory [name] is a 63 y.o. male admitted on 05/01/2024 with sepsis/osteomyelitis.  Pharmacy has been consulted for Vancomycin  dosing.  Plan: Vancomycin  1750 mg x 1 dose given in the ED. Given AKI no continuation dosing ordered yet. Vancomycin  random level ordered for tomorrow around 24 hrs after first dose. This along with Cr should help in deciding dosing.  Height: 6' (182.9 cm) Weight: 85 kg (187 lb 6.3 oz) IBW/kg (Calculated) : 77.6  Temp (24hrs), Avg:100 F (37.8 C), Min:98.5 F (36.9 C), Max:102.9 F (39.4 C)  Recent Labs  Lab 05/01/24 1300 05/01/24 1320  WBC 13.3*  --   CREATININE 3.72*  --   LATICACIDVEN  --  1.5    Estimated Creatinine Clearance: 22 mL/min (A) (by C-G formula based on SCr of 3.72 mg/dL (H)).    Allergies  Allergen Reactions   Latex Other (See Comments)    Allergic, per MAR    Antimicrobials this admission: Vancomycin  11/14 >>  Cefepime  11/14 >>  Metronidazole  11/14>>  Dose adjustments this admission:   Thank you for allowing pharmacy to be a part of this patient's care.  Larraine Brazier, PharmD Clinical Pharmacist 05/01/2024  8:35 PM **Pharmacist phone directory can now be found on [email or web address] ([email or web address]).  Listed under [facility].

## 2024-05-01 NOTE — H&P (Signed)
 Date: 05/01/2024               Patient Name:  Gregory [name] MRN: [id number]  DOB: [birth year] Age / Sex: 63 y.o., male   PCP: [name], Provider, MD         Medical Service: Internal Medicine Teaching Service         Attending Physician: Dr. Eben Reyes BROCKS, MD      First Contact: Intern on Call: [phone]       Second Contact: Resident on [phone]                    SUBJECTIVE   Chief Complaint: confusion  History of Present Illness: Gregory [name] is a 63 y.o. male with history of hypertension, COPD, diabetes, CHF, stroke, right BKA, left toe amputation, with known untreated osteomyelitis.   Per chart, admitted in Sept for sepsis found to have OM but left before ortho surg could eval him.   Patient was alert and oriented on our encounter. Before we arrived, he removed his ostomy bag and had rubbed fecal contents over himself and the bed.   He arrived from [facility] and says his biggest concern is his confusion. He first noticed this a few days ago. He also has a stuffy nose and has pain in his right stump. Other than these three things he has no complaints. Denies pain in left leg, denies changes in left leg, is not concerned about it whatsoever.   Denies fever, chills, N/V, SOB, chest pain, dysphagia, increased ostomy output, or acute visual changes. No pain with urination but sometimes has a hard time initiating flow.   Review of Systems: A complete ROS was negative except as per HPI.   ED Course:  Initially somnolent and not responding to questions.   CO2 21 (*)       Glucose, Bld 103 (*)      BUN 40 (*)      Creatinine, Ser 3.72 (*)      Albumin  2.7 (*)      GFR, Estimated 17 (*)      Anion gap 18 (*)      All other components within normal limits  CBC WITH DIFFERENTIAL/PLATELET - Abnormal; Notable for the following components:    WBC 13.3 (*)      Neutro Abs 11.7 (*)      All other components within normal limits  BRAIN NATRIURETIC PEPTIDE - Abnormal;  Notable for the following components:    B Natriuretic Peptide 108.8 (*)   CXR: low lung volumes with atelectasis X ray foot with swelling sus for OM X ray tib/fib w/o abnormality   Given: cefepime , flagyl , vanc, LR, acetaminophen , LR, droperidol    Past Medical History: hypertension, hyperlipidemia, type 2 diabetes on insulin , cervical spinal stenosis, high output ileostomy, COPD, right AKA, chronic pain syndrome on multiple narcotics, history of opioid OD, stroke, admission in 12/2023 for GBS bacteremia and encephalopathy. Admitted early in 02/2024 for sepsis 2/2 likely osteomyelitis, refused MRI, treated with Zosyn  and Zyvox  for 7 days (BG NG) and refused surgical intervention.   Meds:  Current Meds  Medication Sig   acetaminophen  (TYLENOL ) 500 MG tablet Take 1,000 mg by mouth every 8 (eight) hours.   albuterol  (PROVENTIL ) (2.5 MG/3ML) 0.083% nebulizer solution Take 2.5 mg by nebulization every 6 (six) hours as needed for wheezing or shortness of breath.   albuterol  (VENTOLIN  HFA) 108 (90 Base) MCG/ACT inhaler Inhale 2 puffs into the lungs every  6 (six) hours as needed for wheezing or shortness of breath.   aspirin  EC 81 MG tablet Take 81 mg by mouth daily.   atorvastatin  (LIPITOR ) 80 MG tablet Take 1 tablet (80 mg total) by mouth daily. (Patient taking differently: Take 80 mg by mouth at bedtime.)   bismuth subsalicylate (PEPTO BISMOL) 262 MG/15ML suspension Take 30 mLs by mouth every 12 (twelve) hours as needed for diarrhea or loose stools.   busPIRone  (BUSPAR ) 15 MG tablet Take 15 mg by mouth 3 (three) times daily.   Cholecalciferol  25 MCG (1000 UT) capsule Take 2,000 Units by mouth daily.   clonazePAM  (KLONOPIN ) 0.5 MG tablet Take 0.5 mg by mouth 3 (three) times daily as needed for anxiety.   dicyclomine (BENTYL) 20 MG tablet Take 1 tablet (20 mg total) by mouth 2 (two) times daily.   docusate sodium  (COLACE) 100 MG capsule Take 100 mg by mouth every 12 (twelve) hours.   doxepin   (SINEQUAN ) 25 MG capsule Take 50 mg by mouth at bedtime.   ferrous sulfate  325 (65 FE) MG tablet Take 1 tablet (325 mg total) by mouth 3 (three) times daily with meals.   fluticasone  (FLONASE ) 50 MCG/ACT nasal spray Place 1 spray into both nostrils daily.   furosemide  (LASIX ) 20 MG tablet Take 20 mg by mouth daily.   ibuprofen  (ADVIL ) 600 MG tablet Take 600 mg by mouth every 6 (six) hours as needed for mild pain (pain score 1-3) or moderate pain (pain score 4-6).   insulin  lispro (HUMALOG ) 100 UNIT/ML KwikPen Inject 0-10 Units into the skin See admin instructions. Inject 0-10 units into the skin three times a day and at bedtime, PER SLIDING SCALE:  BGL 151-200 = 2 units 201-250 = 4 units 251-300 = 6 units 301-350 =  8 units 351-400 = 10 units   JOURNAVX  50 MG TABS Take 1 tablet by mouth 2 (two) times daily.   lactulose  (CHRONULAC ) 10 GM/15ML solution Take 30 mLs (20 g total) by mouth 2 (two) times daily.   levETIRAcetam  (KEPPRA ) 500 MG tablet Take 1 tablet (500 mg total) by mouth 2 (two) times daily.   lidocaine  4 % Place 1 patch onto the skin daily. Apply 1 new patch to the affected area and remove 12 hours later   loperamide  (IMODIUM ) 2 MG capsule Take 1 capsule (2 mg total) by mouth 4 (four) times daily as needed for diarrhea or loose stools.   loratadine  (CLARITIN ) 10 MG tablet Take 10 mg by mouth daily.   magnesium  oxide (MAG-OX) 400 (240 Mg) MG tablet Take 400 mg by mouth 3 (three) times daily.   Melatonin 10 MG TABS Take 10 mg by mouth at bedtime.   Meth-Hyo-M Bl-Na Phos-Ph Sal (URO-MP) 118 MG CAPS Take 118 mg by mouth 2 (two) times daily.   methocarbamol  (ROBAXIN ) 500 MG tablet Take 500 mg by mouth every 6 (six) hours as needed for muscle spasms.   naloxone  (NARCAN ) 0.4 MG/ML injection Inject 0.4 mg into the muscle as needed (for oversedation).   naloxone  (NARCAN ) nasal spray 4 mg/0.1 mL Place 1 spray into the nose See admin instructions. Instill 1 spray into alternating nostrils every  5 minutes as needed for an opiate overdose   ondansetron  (ZOFRAN ) 8 MG tablet Take 8 mg by mouth every 6 (six) hours as needed for nausea or vomiting.   OXcarbazepine  (TRILEPTAL ) 150 MG tablet Take 1 tablet (150 mg total) by mouth 2 (two) times daily.   oxyCODONE  (OXY IR/ROXICODONE ) 5  MG immediate release tablet Take 5 mg by mouth every 6 (six) hours as needed for severe pain (pain score 7-10) or moderate pain (pain score 4-6).   QUEtiapine  (SEROQUEL ) 25 MG tablet Take 1 tablet (25 mg total) by mouth at bedtime and may repeat dose one time if needed. (Patient taking differently: Take 25 mg by mouth 2 (two) times daily.)   sodium bicarbonate  650 MG tablet Take 650 mg by mouth 2 (two) times daily.   sodium chloride  (OCEAN) 0.65 % SOLN nasal spray Place 1 spray into both nostrils every 4 (four) hours as needed for congestion.   tamsulosin  (FLOMAX ) 0.4 MG CAPS capsule Take 1 capsule (0.4 mg total) by mouth daily. (Patient taking differently: Take 0.4 mg by mouth daily at 12 noon.)  Methadone  5mg  TID Methadone  10 mg TID.SABRASABRA 15 combined  Oxycodone  5mg  prn q6  Clonazepam  5 TID Loratidine 10 mg daily Lasix  20 mg daily Bnt[place] Zofran  every 6 hours  Seroquel  25 mg every 12 hours Buspar  15 mg one tablet TID Melatonin 10 mg nightly  Docusate every 12 hours Vitamin D   Aspirin  81 mg daily Oxcarbamine 150 mg BID Keppra  500 mg BID  Humalog  SSI Albuterol  nebs prn  Iron  Narcan  prn  Nalozone injection 24 hours as needed Uronp BID Flomax  one daily  Methocarbonmal q6 prn  Mag 4 mg TID Atorvastatin  80 mg  Jornavx 50mg  Lyrica  200 mg BID  Allergies: Allergies as of 05/01/2024 - Review Complete 05/01/2024  Allergen Reaction Noted   Latex Other (See Comments) 08/13/2016    Past Surgical History:  Procedure Laterality Date   AMPUTATION Left 03/29/2017   Procedure: LEFT GREAT TOE AMPUTATION AT METATARSOPHALANGEAL JOINT;  Surgeon: Harden Jerona GAILS, MD;  Location: [facility] OR;  Service: Orthopedics;   Laterality: Left;   AMPUTATION Right 03/29/2017   Procedure: RIGHT BELOW KNEE AMPUTATION;  Surgeon: Harden Jerona GAILS, MD;  Location: [facility] OR;  Service: Orthopedics;  Laterality: Right;   AMPUTATION Left 06/06/2018   Procedure: LEFT 2ND TOE AMPUTATION;  Surgeon: Harden Jerona GAILS, MD;  Location: [facility] OR;  Service: Orthopedics;  Laterality: Left;   AMPUTATION Right 11/19/2018   Procedure: AMPUTATION ABOVE KNEE;  Surgeon: Harden Jerona GAILS, MD;  Location: [facility] OR;  Service: Orthopedics;  Laterality: Right;   ANTERIOR CERVICAL CORPECTOMY N/A 11/25/2015   Procedure: ANTERIOR CERVICAL FIVE CORPECTOMY Cervical four - six fusion;  Surgeon: Gerldine Maizes, MD;  Location: [facility] ORS;  Service: Neurosurgery;  Laterality: N/A;  ANTERIOR CERVICAL FIVE CORPECTOMY Cervical four - six fusion   APPLICATION OF WOUND VAC Right 10/22/2018   Procedure: Application Of Wound Vac;  Surgeon: Harden Jerona GAILS, MD;  Location: [facility] OR;  Service: Orthopedics;  Laterality: Right;   CYSTOSCOPY WITH BIOPSY N/A 05/01/2022   Procedure: CYSTOSCOPY WITH URETHRAL BIOPSY;  Surgeon: Elisabeth Valli BIRCH, MD;  Location: [facility] ORS;  Service: Urology;  Laterality: N/A;  1 HR   CYSTOSCOPY WITH RETROGRADE URETHROGRAM N/A 04/25/2018   Procedure: CYSTOSCOPY WITH RETROGRADE URETHROGRAM/ BALLOON DILATION;  Surgeon: Ottelin, Mark, MD;  Location: [facility] ORS;  Service: Urology;  Laterality: N/A;   CYSTOSCOPY WITH URETHRAL DILATATION N/A 05/01/2022   Procedure: BALLOON DILATION WITH  OPTILUME;  Surgeon: Elisabeth Valli BIRCH, MD;  Location: [facility] ORS;  Service: Urology;  Laterality: N/A;   LAPAROTOMY N/A 12/03/2022   Procedure: EXPLORATORY LAPAROTOMY  total abdominal colectomy with end ileostomy;  Surgeon: Kinsinger, Herlene Righter, MD;  Location: [facility] OR;  Service: General;  Laterality: N/A;   MULTIPLE TOOTH  EXTRACTIONS     POSTERIOR CERVICAL FUSION/FORAMINOTOMY N/A 11/29/2015   Procedure: Cervical Three-Cervical Seven Posterior Cervical Laminectomy with Fusion;  Surgeon: Gerldine Maizes, MD;  Location: [facility] OR[facility];  Service: Neurosurgery;  Laterality: N/A;  Cervical Three-Cervical Seven Posterior Cervical Laminectomy with Fusion   STUMP REVISION Right 10/22/2018   Procedure: REVISION RIGHT BELOW KNEE AMPUTATION;  Surgeon: Harden Jerona GAILS, MD;  Location: [facility] OR;  Service: Orthopedics;  Laterality: Right;   STUMP REVISION Right 12/05/2018   Procedure: Revision Right Above Knee Amputation;  Surgeon: Harden Jerona GAILS, MD;  Location: [facility] OR;  Service: Orthopedics;  Laterality: Right;   STUMP REVISION Right 05/29/2019   Procedure: REVISION RIGHT ABOVE KNEE AMPUTATION;  Surgeon: Harden Jerona GAILS, MD;  Location: [facility] OR;  Service: Orthopedics;  Laterality: Right;   STUMP REVISION Right 06/24/2019   Procedure: STUMP REVISION;  Surgeon: Harden Jerona GAILS, MD;  Location: [facility] OR;  Service: Orthopedics;  Laterality: Right;    Social:  Lives With: [employer] Occupation: none Support: daughter  Level of Function: dependent, in wheelchair PCP: [facility] Substances: denies tobacco, drinks alcohol  unclear as to how much, last drink two days ago, on chronic opioids for chronic pain with several ED visits for unintentional OD   OBJECTIVE:   Physical Exam: Blood pressure 134/84, pulse (!) 103, temperature (!) 102.9 F (39.4 C), temperature source Axillary, resp. rate 20, height 6' (1.829 m), weight 85 kg, SpO2 95%.  Constitutional: agitated appearing male lying in ED hospital bed covered in his feces, gastric mucosa extending from stoma HENT: normocephalic atraumatic, mucous membranes moist Cardiovascular: regular rate and rhythm, distant heart sounds Pulmonary/Chest: effort normal, on [place] Neurological: alert & oriented x 3 MSK: Left lower leg and foot edematous, erythematous, hyperkeratotic scale, studded with areas of transudate, multiple prior toe amputations to left foot, R AKA with appropriately healed scar    Labs: CBC    Component Value Date/Time   WBC 13.3 (H) 05/01/2024 1300    RBC 4.52 05/01/2024 1300   HGB 13.5 05/01/2024 1300   HGB 10.7 (L) 02/24/2020 0938   HCT 44.4 05/01/2024 1300   PLT 189 05/01/2024 1300   PLT 221 02/24/2020 0938   MCV 98.2 05/01/2024 1300   MCH 29.9 05/01/2024 1300   MCHC 30.4 05/01/2024 1300   RDW 14.6 05/01/2024 1300   LYMPHSABS 1.1 05/01/2024 1300   MONOABS 0.4 05/01/2024 1300   EOSABS 0.1 05/01/2024 1300   BASOSABS 0.0 05/01/2024 1300     CMP     Component Value Date/Time   NA 142 05/01/2024 1300   NA 138 10/22/2016 0000   K 4.6 05/01/2024 1300   CL 103 05/01/2024 1300   CO2 21 (L) 05/01/2024 1300   GLUCOSE 103 (H) 05/01/2024 1300   BUN 40 (H) 05/01/2024 1300   BUN 26 (A) 10/22/2016 0000   CREATININE 3.72 (H) 05/01/2024 1300   CREATININE 1.95 (H) 02/24/2020 0938   CREATININE 2.67 (H) 10/02/2016 1651   CALCIUM  9.1 05/01/2024 1300   PROT 7.1 05/01/2024 1300   ALBUMIN  2.7 (L) 05/01/2024 1300   AST 31 05/01/2024 1300   AST 8 (L) 02/24/2020 0938   ALT 14 05/01/2024 1300   ALT 8 02/24/2020 0938   ALKPHOS 82 05/01/2024 1300   BILITOT 0.4 05/01/2024 1300   BILITOT 0.3 02/24/2020 0938   GFRNONAA 17 (L) 05/01/2024 1300   GFRNONAA 36 (L) 02/24/2020 0938   GFRNONAA 25 (L) 10/02/2016 1651   GFRAA 42 (L) 02/24/2020 9061  GFRAA 29 (L) 10/02/2016 1651    Imaging: DG Tibia/Fibula Left Result Date: 05/01/2024 EXAM: 2 VIEW(S) Xray of the Left Tibia and Fibula 05/01/2024 01:56:00 PM COMPARISON: None available. CLINICAL HISTORY: Known osteo Known osteo FINDINGS: BONES AND JOINTS: No acute fracture. No focal osseous lesion. No joint dislocation. SOFT TISSUES: The soft tissues are unremarkable. IMPRESSION: 1. No significant abnormality. Electronically signed by: Lynwood Seip MD 05/01/2024 02:27 PM EST RP Workstation: [id number]   DG Foot 2 Views Left Result Date: 05/01/2024 EXAM: 2 VIEW(S) XRAY OF THE LEFT FOOT 05/01/2024 01:56:00 PM COMPARISON: 02/23/2024. Only lateral image was submitted for review. CLINICAL HISTORY: known  osteo known osteo FINDINGS: BONES AND JOINTS: Status post amputation of distal portions of first and second metatarsals and phalanges. Osteomyelitis cannot be excluded due to the single limited image. No acute fracture. No joint dislocation. SOFT TISSUES: Vascular calcifications are noted. Severe dorsal soft tissue swelling is noted most consistent with infection. IMPRESSION: 1. Severe dorsal soft tissue swelling, suspicious for infection. Osteomyelitis cannot be excluded due to single limited lateral view. 2. 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 Electronically signed by: Lynwood Seip MD 05/01/2024 02:26 PM EST RP Workstation: [id number]   DG Chest Port 1 View Result Date: 05/01/2024 CLINICAL DATA:  Sepsis. EXAM: PORTABLE CHEST 1 VIEW COMPARISON:  03/10/2024 FINDINGS: The heart size and mediastinal contours are within normal limits. Low lung volumes with bibasilar atelectasis. There is no evidence of pulmonary edema, consolidation, pneumothorax or pleural fluid. The visualized skeletal structures are unremarkable. IMPRESSION: Low lung volumes with bibasilar atelectasis. Electronically Signed   By: Marcey Moan M.D.   On: 05/01/2024 14:14      EKG: personally reviewed my interpretation is artifact changes, no STEMI.   ASSESSMENT & PLAN:   Assessment & Plan by Problem: Principal Problem:   Sepsis (HCC) Active Problems:   AKI (acute kidney injury)   Hypertensive heart disease with congestive heart failure (HCC)   S/P AKA (above knee amputation) unilateral, right (HCC)   CKD (chronic kidney disease) stage 3, GFR 30-59 ml/min (HCC)   Ileostomy status (HCC)   Increased anion gap metabolic acidosis   QT prolongation   Hemoglobinuria   Gregory [name] is a 63 y.o. person living with a history of hypertension, hyperlipidemia, type 2 diabetes on insulin , cervical spinal stenosis, high output ileostomy, COPD, right AKA, chronic pain syndrome on  multiple narcotics, history of opioid OD, stroke, admission in 12/2023 for GBS bacteremia and encephalopathy. Admitted early in 02/2024 for sepsis 2/2 likely osteomyelitis, refused MRI, treated with Zosyn  and Zyvox  for 7 days (BG NG) and refused surgical intervention. who presented with confusion from [facility] and admitted for sepsis on hospital day 0  #Sepsis likely 2/2 L foot osteomyelitis #Cellulitis  Tachycardic, 102 degree fever, WBC 13.3 with neutrophil predominance in setting of untreated L foot infection (sus for osteo in 02/2024) that is grossly toxic appearing. RVP neg. Patient refuses MRI but consented to CT with sedating medications. Consulted ortho surg, appreciate recs. Continue broad spec abx with Vanc per pharm, metro and cefepime .  -fu CT -fu blood cxs -CBC AM  #Acute Encephalopathic, appears to have resolved Likely due to sepsis and resulting metabolic derangements, also on several sedating home medications. Agitation improved in ED with droperidol , haloperidol  and midazolam  -quetiapine  25 mg BID  #AKI on CKD3 #Azotemia #Hemoglobinuria  On admission, Cr 3.72 (1.73 one month ago), BUN 40 (14) and GFR 17 (44). AG  18. Lactic 1.5. UA neg for ketones, moderate Hgb and color green. Possibly pre renal in the setting of sepsis, CKD and azotemia. Did have trouble initiating stream during the encounter, on tamsulosin  at home.  -CK pending  -bladder scan  -CMP Mg AM -NaHCO3 650 mg BID -tamsulosin  0.4 mg daily -atorvastatin  80 mg daily  T2DM On a sliding scale at home. A1c 5.9 two months ago.  -continue sliding scale   #Congestive HF On admission, BNP 108.8. BP 121/60 and has been stable since arrival. CXR c/w normal heart size and neg for pulm edema. Does not appear volume overloaded. Will continue to monitor and hold off on fluids and lasix  for now. -hold home Lasix  due to Cr -CMP Mg AM  #Ileostomy Patient removed ostomy bag in ED. Denies recent high output.  -wound ostomy  consulted  -Dicyclomine 20 mg  -lactulose    #COPD Denies increase sputum production or cough. Does have bothersome congestion. RVP neg. Was saturating well on RA, eventually did have [place] on although I don't think this was necessary.  Low concern for exacerbation at this time.  -albuterol  -fluticasone    Qtc Prolongation  612 ms on admission. Likely from home methadone   -Avoid QTC prolonging medications -Continuous cardiac monitor   Chronic pain and anxiety  Per chart, Hx of multiple hospitalizations due to opioid overdose. Exam not concerning for acute overdose or withdrawal.  -home methadone  15 mg -oxycodone  5 mg -Buspirone  15 mg TID -Clonazepam  0.5 mg TID PRN -methocarbamol  500 mg Q6HR PRN -Hold on Doxepin  due to Qtc  Hx of stroke -Continue ASA 81 mg -atorvastatin  80 mg daily  Hx of seizures? Keppra  500 mg BID  Diet: NPO at midnight  VTE: Heparin  Code: Full  Prior to Admission Living Arrangement: SNF, [facility] Anticipated Discharge Location: [facility] Barriers to Discharge: medical mgmt   Dispo: Admit patient to Inpatient with expected length of stay greater than 2 midnights.  Signed:   Damien Lease, DO Internal Medicine Resident PGY-2 05/01/2024, 7:43 PM   Please contact the on call pager at [phone]

## 2024-05-01 NOTE — ED Notes (Signed)
 Pt removed ostomy bag and is rubbing the contents on himself

## 2024-05-02 DIAGNOSIS — A419 Sepsis, unspecified organism: Secondary | ICD-10-CM | POA: Diagnosis not present

## 2024-05-02 DIAGNOSIS — N179 Acute kidney failure, unspecified: Secondary | ICD-10-CM | POA: Diagnosis not present

## 2024-05-02 DIAGNOSIS — R6520 Severe sepsis without septic shock: Secondary | ICD-10-CM | POA: Diagnosis not present

## 2024-05-02 DIAGNOSIS — D631 Anemia in chronic kidney disease: Secondary | ICD-10-CM

## 2024-05-02 DIAGNOSIS — L03116 Cellulitis of left lower limb: Secondary | ICD-10-CM | POA: Diagnosis not present

## 2024-05-02 DIAGNOSIS — D649 Anemia, unspecified: Secondary | ICD-10-CM

## 2024-05-02 LAB — CBC
HCT: 27.7 % — ABNORMAL LOW (ref 39.0–52.0)
HCT: 29.1 % — ABNORMAL LOW (ref 39.0–52.0)
Hemoglobin: 8.7 g/dL — ABNORMAL LOW (ref 13.0–17.0)
Hemoglobin: 9.3 g/dL — ABNORMAL LOW (ref 13.0–17.0)
MCH: 30.2 pg (ref 26.0–34.0)
MCH: 30.1 pg (ref 26.0–34.0)
MCHC: 31.4 g/dL (ref 30.0–36.0)
MCHC: 32 g/dL (ref 30.0–36.0)
MCV: 96.2 fL (ref 80.0–100.0)
MCV: 94.2 fL (ref 80.0–100.0)
Platelets: 155 K/uL (ref 150–400)
Platelets: 141 K/uL — ABNORMAL LOW (ref 150–400)
RBC: 2.88 MIL/uL — ABNORMAL LOW (ref 4.22–5.81)
RBC: 3.09 MIL/uL — ABNORMAL LOW (ref 4.22–5.81)
RDW: 14.3 % (ref 11.5–15.5)
RDW: 14.2 % (ref 11.5–15.5)
WBC: 11.2 K/uL — ABNORMAL HIGH (ref 4.0–10.5)
WBC: 7.9 K/uL (ref 4.0–10.5)
nRBC: 0 % (ref 0.0–0.2)
nRBC: 0 % (ref 0.0–0.2)

## 2024-05-02 LAB — IRON AND TIBC
Iron: 69 ug/dL (ref 45–182)
Saturation Ratios: 42 % — ABNORMAL HIGH (ref 17.9–39.5)
TIBC: 165 ug/dL — ABNORMAL LOW (ref 250–450)
UIBC: 96 ug/dL

## 2024-05-02 LAB — COMPREHENSIVE METABOLIC PANEL WITH GFR
ALT: 14 U/L (ref 0–44)
AST: 40 U/L (ref 15–41)
Albumin: 2 g/dL — ABNORMAL LOW (ref 3.5–5.0)
Alkaline Phosphatase: 65 U/L (ref 38–126)
Anion gap: 12 (ref 5–15)
BUN: 39 mg/dL — ABNORMAL HIGH (ref 8–23)
CO2: 21 mmol/L — ABNORMAL LOW (ref 22–32)
Calcium: 8.3 mg/dL — ABNORMAL LOW (ref 8.9–10.3)
Chloride: 107 mmol/L (ref 98–111)
Creatinine, Ser: 3.17 mg/dL — ABNORMAL HIGH (ref 0.61–1.24)
GFR, Estimated: 21 mL/min — ABNORMAL LOW (ref >60)
Glucose, Bld: 80 mg/dL (ref 70–99)
Potassium: 4.1 mmol/L (ref 3.5–5.1)
Sodium: 140 mmol/L (ref 135–145)
Total Bilirubin: 0.9 mg/dL (ref 0.0–1.2)
Total Protein: 5.6 g/dL — ABNORMAL LOW (ref 6.5–8.1)

## 2024-05-02 LAB — GLUCOSE, CAPILLARY
Glucose-Capillary: 84 mg/dL (ref 70–99)
Glucose-Capillary: 65 mg/dL — ABNORMAL LOW (ref 70–99)
Glucose-Capillary: 71 mg/dL (ref 70–99)
Glucose-Capillary: 103 mg/dL — ABNORMAL HIGH (ref 70–99)
Glucose-Capillary: 150 mg/dL — ABNORMAL HIGH (ref 70–99)

## 2024-05-02 LAB — URINE CULTURE: Culture: <10000 — AB

## 2024-05-02 LAB — MAGNESIUM: Magnesium: 1.8 mg/dL (ref 1.7–2.4)

## 2024-05-02 LAB — VANCOMYCIN, RANDOM: Vancomycin Rm: 15 ug/mL

## 2024-05-02 LAB — FERRITIN: Ferritin: 1838 ng/mL — ABNORMAL HIGH (ref 24–336)

## 2024-05-02 MED ORDER — ENSURE PLUS HIGH PROTEIN PO LIQD
237.0000 mL | Freq: Two times a day (BID) | ORAL | Status: DC
  Administered 2024-05-02 – 2024-05-05 (×6): 237 mL via ORAL

## 2024-05-02 MED ORDER — ADULT MULTIVITAMIN W/MINERALS CH
1.0000 | ORAL_TABLET | Freq: Every day | ORAL | Status: DC
  Administered 2024-05-02 – 2024-05-05 (×4): 1 via ORAL
  Filled 2024-05-02 (×4): qty 1

## 2024-05-02 MED ORDER — VANCOMYCIN HCL 1500 MG/300ML IV SOLN
1500.0000 mg | INTRAVENOUS | Status: DC
  Administered 2024-05-02: 1500 mg via INTRAVENOUS
  Filled 2024-05-02: qty 300

## 2024-05-02 MED ORDER — PIPERACILLIN-TAZOBACTAM 3.375 G IVPB
3.3750 g | Freq: Three times a day (TID) | INTRAVENOUS | Status: DC
  Administered 2024-05-02 – 2024-05-04 (×6): 3.375 g via INTRAVENOUS
  Filled 2024-05-02 (×6): qty 50

## 2024-05-02 MED ORDER — GERHARDT'S BUTT CREAM
TOPICAL_CREAM | Freq: Three times a day (TID) | CUTANEOUS | Status: DC
  Filled 2024-05-02: qty 60

## 2024-05-02 MED ORDER — CHLORHEXIDINE GLUCONATE CLOTH 2 % EX PADS
6.0000 | MEDICATED_PAD | Freq: Every day | CUTANEOUS | Status: DC
  Administered 2024-05-02 – 2024-05-05 (×4): 6 via TOPICAL

## 2024-05-02 MED ORDER — NICOTINE 21 MG/24HR TD PT24
21.0000 mg | MEDICATED_PATCH | Freq: Every day | TRANSDERMAL | Status: DC
  Administered 2024-05-02 – 2024-05-05 (×4): 21 mg via TRANSDERMAL
  Filled 2024-05-02 (×4): qty 1

## 2024-05-02 MED ORDER — MAGNESIUM SULFATE 2 GM/50ML IV SOLN
2.0000 g | Freq: Once | INTRAVENOUS | Status: AC
  Administered 2024-05-02: 2 g via INTRAVENOUS
  Filled 2024-05-02: qty 50

## 2024-05-02 NOTE — Evaluation (Signed)
 Physical Therapy Evaluation Patient Details Name: Gregory [name] MRN: [id number] DOB: 10-13-1960 Today's Date: 05/02/2024  History of Present Illness  63 yo male from [place] admitted 05/01/24 with confusion due to sepsis from LLE cellulitis/ osteomyelitis with refusal of surgical intervention last admission 9/25. PMH includes: DM II, HTN, HLD, CKD II, COPD, s/p R AKA, L midfoot amputation, CVA, Afib, mesenteric ischemia s/p colostomy  Clinical Impression  Pt admitted with above diagnosis. Pt from SNF where he first reported he was able to transfer to w/c with stand pivot but later agreed that hoyer has been used recently. Due to current AMS unsure of true PLOF. Pt needed min A+2 to come to EOB and could maintain sitting upright unsupported for only a few minutes before needing to return to supine due to discomfort. Will place on PT caseload to see what happens from a surgical standpoint but will likely need d/c back to SNF with dependent transfers.  Pt currently with functional limitations due to the deficits listed below (see PT Problem List). Pt will benefit from acute skilled PT to increase their independence and safety with mobility to allow discharge.           If plan is discharge home, recommend the following: Two people to help with walking and/or transfers;A lot of help with bathing/dressing/bathroom;Assistance with cooking/housework;Assist for transportation   Can travel by private vehicle   No    Equipment Recommendations Wheelchair (measurements PT)  Recommendations for Other Services       Functional Status Assessment Patient has had a recent decline in their functional status and demonstrates the ability to make significant improvements in function in a reasonable and predictable amount of time.     Precautions / Restrictions Precautions Precautions: Fall Recall of Precautions/Restrictions: Impaired Restrictions Weight Bearing Restrictions Per Provider Order: No       Mobility  Bed Mobility Overal bed mobility: Needs Assistance Bed Mobility: Supine to Sit, Sit to Supine     Supine to sit: Min assist, +2 for physical assistance Sit to supine: Mod assist, +2 for physical assistance   General bed mobility comments: pt able to use rails and pull with UE's to come to EOB with min A to LLE and +2 from behind. Increased assist to return to supine. Pt tolerated sitting upright only a few minutes before reporting he had to return to supine    Transfers                   General transfer comment: unable to attempt. Will need hoyer    Ambulation/Gait               General Gait Details: unable  Stairs            Wheelchair Mobility     Tilt Bed    Modified Rankin (Stroke Patients Only)       Balance Overall balance assessment: Needs assistance Sitting-balance support: Bilateral upper extremity supported, Feet unsupported Sitting balance-Leahy Scale: Poor Sitting balance - Comments: L lean, close guarding given Postural control: Left lateral lean                                   Pertinent Vitals/Pain Pain Assessment Pain Assessment: Faces Faces Pain Scale: Hurts even more Pain Location: LLE and scrotum Pain Descriptors / Indicators: Discomfort, Grimacing Pain Intervention(s): Limited activity within patient's tolerance, Monitored during session  Home Living Family/patient expects to be discharged to:: Skilled nursing facility                   Additional Comments: from [facility] SNF    Prior Function Prior Level of Function : Needs assist;Patient poor historian/Family not available             Mobility Comments: Pt reports use of w/c primarily. Unsure of how recently was pivoting to chair vs hoyer lift ADLs Comments: Reports able to self feed and groom, assist for bathing and dressing     Extremity/Trunk Assessment   Upper Extremity Assessment Upper Extremity Assessment:  Defer to OT evaluation    Lower Extremity Assessment Lower Extremity Assessment: RLE deficits/detail;LLE deficits/detail;Generalized weakness RLE Deficits / Details: AKA well healed RLE Sensation: WNL RLE Coordination: WNL LLE Deficits / Details: noted redness knee and below, foot deformity and trophic changes. Hip flex 3-/5, knee ext 3/5 LLE Sensation: decreased light touch;decreased proprioception;history of peripheral neuropathy LLE Coordination: decreased gross motor    Cervical / Trunk Assessment Cervical / Trunk Assessment: Other exceptions Cervical / Trunk Exceptions: increased body habitus  Communication   Communication Communication: No apparent difficulties    Cognition Arousal: Lethargic Behavior During Therapy: Flat affect   PT - Cognitive impairments: No family/caregiver present to determine baseline                       PT - Cognition Comments: pt with intermittient lethargy. Poor historian. Decreased insight into deficits and precautions. Refuses often, including during session when RN wanted to get him to air bed for sacral wound Following commands: Impaired Following commands impaired: Follows one step commands inconsistently     Cueing Cueing Techniques: Verbal cues, Gestural cues     General Comments General comments (skin integrity, edema, etc.): pt with swelling, redness, and one open spot on scrotum, elevated with towel end of session. VSS on RA. Pt had pulled out L PIV before PT/OT entry, RN notified. Pillow left under L hip for decreasing sacral pressure, LLE elevated    Exercises     Assessment/Plan    PT Assessment Patient needs continued PT services  PT Problem List Decreased strength;Decreased activity tolerance;Decreased balance;Decreased cognition;Decreased mobility;Decreased range of motion;Decreased safety awareness;Decreased knowledge of precautions;Pain;Obesity       PT Treatment Interventions DME instruction;Functional  mobility training;Therapeutic activities;Therapeutic exercise;Balance training;Cognitive remediation;Patient/family education;Neuromuscular re-education;Wheelchair mobility training    PT Goals (Current goals can be found in the Care Plan section)  Acute Rehab PT Goals Patient Stated Goal: return to SNF PT Goal Formulation: With patient Time For Goal Achievement: 05/16/24 Potential to Achieve Goals: Fair    Frequency Min 1X/week     Co-evaluation PT/OT/SLP Co-Evaluation/Treatment: Yes Reason for Co-Treatment: Complexity of the patient's impairments (multi-system involvement);Necessary to address cognition/behavior during functional activity;For patient/therapist safety PT goals addressed during session: Mobility/safety with mobility;Balance         AM-PAC PT 6 Clicks Mobility  Outcome Measure Help needed turning from your back to your side while in a flat bed without using bedrails?: A Lot Help needed moving from lying on your back to sitting on the side of a flat bed without using bedrails?: A Lot Help needed moving to and from a bed to a chair (including a wheelchair)?: Total Help needed standing up from a chair using your arms (e.g., wheelchair or bedside chair)?: Total Help needed to walk in hospital room?: Total Help needed climbing 3-5 steps with  a railing? : Total 6 Click Score: 8    End of Session   Activity Tolerance: Patient limited by pain Patient left: in bed;with call bell/phone within reach;with bed alarm set;with nursing/sitter in room Nurse Communication: Mobility status PT Visit Diagnosis: Muscle weakness (generalized) (M62.81);Pain Pain - Right/Left: Left Pain - part of body: Leg    Time: 8963-8897 PT Time Calculation (min) (ACUTE ONLY): 26 min   Charges:   PT Evaluation $PT Eval Moderate Complexity: 1 Mod   PT General Charges $$ ACUTE PT VISIT: 1 Visit         Richerd Lipoma, PT  Acute Rehab Services Secure chat preferred Office  [phone]   Richerd CROME Luisana Lutzke 05/02/2024, 1:07 PM

## 2024-05-02 NOTE — Plan of Care (Addendum)
 1620 patient started shouting for help. Bed alarm was onn and started alarming. Went to pt room and pt was on the edge of the bed verbalizing I wanted to go outside to smoke get him situated, gave him a bath, did foley care. 1644 gave PRN clonazepam .  Patient was still agitated and screaming on his room. CN made aware. Notified the MD 1720 patient was complaining of SOB. PRN albuterol  was given. 1732 MD was on his way to do assessment for his agitation. Verbalized to give early dose of seroquel . Seroquel  given. Patient started to calm down for few minutes and started to yell again, get out of bed and trying to remove his foley. Patient was saying that he still wanted to go out to smoke. 1754 MD ordered sitter and restraints (soft waist and wrist) 1814 bilateral wrist restraints applied. CN was at bedside and 1 RN.  No soft waist restraints  available at the moment, soft waist was ordered by the secretary. Patient continues to shout. 1856 [name] (daughter) called for updates abt putting pt on restraints and aware. Called pt and talk to him. Bed alarm on. Call bell within reached. Sitter at bedside.

## 2024-05-02 NOTE — Progress Notes (Signed)
 Pharmacy Antibiotic Note  Gregory [name] is a 63 y.o. male admitted on 05/01/2024 with sepsis/osteomyelitis.  Pharmacy has been consulted for Vancomycin  dosing.   Vancomycin  1750 mg x 1 given in the ED on 11/14 @ 14:17  Vancomycin  Random level = 15 mcg/ml @14 :09 on 05/02/24 SCr 3.17     Plan:   Vancomycin  1500 mg IV q48 hours  Goal AUC 400-550. Expected AUC: 474 SCr used: 3.17 Monitor clinical progress, renal function, cultures and vanc levels per protocol.     Height: 6' (182.9 cm) Weight: 85 kg (187 lb 6.3 oz) IBW/kg (Calculated) : 77.6  Temp (24hrs), Avg:99 F (37.2 C), Min:97.4 F (36.3 C), Max:101.9 F (38.8 C)  Recent Labs  Lab 05/01/24 1300 05/01/24 1320 05/02/24 0514 05/02/24 0821 05/02/24 1409  WBC 13.3*  --   --  11.2*  --   CREATININE 3.72*  --  3.17*  --   --   LATICACIDVEN  --  1.5  --   --   --   VANCORANDOM  --   --   --   --  15    Estimated Creatinine Clearance: 25.8 mL/min (A) (by C-G formula based on SCr of 3.17 mg/dL (H)).    Allergies  Allergen Reactions   Latex Other (See Comments)    Allergic, per MAR    Antimicrobials this admission: Vancomycin  11/14 >>  Cefepime  11/14 >>  Metronidazole  11/14>>  Dose adjustments this admission:   Thank you for allowing pharmacy to be a part of this patient's care.  Levorn Gaskins, RPh Clinical Pharmacist 05/02/2024  4:34 PM **Pharmacist phone directory can now be found on [email or web address] ([email or web address]).  Listed under [facility].

## 2024-05-02 NOTE — Progress Notes (Signed)
 Patient refused specialty mattress. Explained to the patient that he needed it for his wounds on his buttocks but patient insistently said no it does not work, I don't like it.

## 2024-05-02 NOTE — Progress Notes (Signed)
 HD#1 SUBJECTIVE:  Patient Summary: Gregory [name] is a 63 y.o. with a pertinent PMH of hypertension, COPD, diabetes, CHF, stroke, right AKA, left toe amputations, with known osteomyelitis, who presented with confusion and admitted for sepsis.   Overnight Events: none  Interim History: Feels like his mouth is dry and still has a stuffy nose. The only pain he has is in his R stump, this is chronic for him. Discussed plan for ortho team to come see him. He wants us  to update his daughter. Will get him some ice chips and hold off on food until ortho sees him and will call his daughter.   OBJECTIVE:  Vital Signs: Vitals:   05/01/24 1715 05/01/24 1749 05/01/24 2200 05/02/24 0602  BP: 127/67 121/60 (!) 136/59 137/61  Pulse: (!) 113 88 82 85  Resp: 18 16 16 16   Temp: 98.7 F (37.1 C) 98.5 F (36.9 C) 99.2 F (37.3 C) (!) 101.9 F (38.8 C)  TempSrc: Axillary Oral Oral Oral  SpO2: 96% 94% 94% 93%  Weight:      Height:       Supplemental O2: Nasal Cannula SpO2: 93 % O2 Flow Rate (L/min): 2 L/min FiO2 (%): (!) 2 %  Filed Weights   05/01/24 1348  Weight: 85 kg     Intake/Output Summary (Last 24 hours) at 05/02/2024 0715 Last data filed at 05/01/2024 2202 Gross per 24 hour  Intake 351.15 ml  Output 200 ml  Net 151.15 ml   Net IO Since Admission: 151.15 mL [05/02/24 0715]  Physical Exam: Physical Exam Vitals reviewed.  Constitutional:      Appearance: He is not ill-appearing.  HENT:     Nose:     Comments: [place] in    Mouth/Throat:     Mouth: Mucous membranes are dry.  Eyes:     Conjunctiva/sclera: Conjunctivae normal.  Cardiovascular:     Pulses: Normal pulses.  Pulmonary:     Effort: Pulmonary effort is normal.  Abdominal:     Comments: Ostomy bad in place with small amount of solid brown stool  Musculoskeletal:        General: Swelling present. No tenderness.     Comments: R AKA without swelling or erythema. Normal appearing scar although tender to light touch L  leg swollen to mid thigh, not warm, erythematous with hyperkeratotic scale and transudate L foot with two toe amputations, very swollen   Neurological:     Mental Status: He is alert. Mental status is at baseline.     Comments: Does not know why he is in the hospital   Psychiatric:        Mood and Affect: Mood normal.     Patient Lines/Drains/Airways Status     Active Line/Drains/Airways     Name Placement date Placement time Site Days   Peripheral IV 05/01/24 Right Antecubital 05/01/24  1312  Antecubital  1   Peripheral IV 05/01/24 20 G 2.5 Left;Anterior Forearm 05/01/24  1450  Forearm  1   Ileostomy Continent (Abdominal pouch) RUQ 12/04/22  0001  RUQ  515            Pertinent labs and imaging:  Bladder scan 736 mL this AM Mg 1.8    Latest Ref Rng & Units 05/01/2024    1:00 PM 03/24/2024    2:06 AM 03/10/2024   11:32 PM  CBC  WBC 4.0 - 10.5 K/uL 13.3  8.9  9.0   Hemoglobin 13.0 - 17.0 g/dL 13.5  9.8  9.4   Hematocrit 39.0 - 52.0 % 44.4  30.0  30.0   Platelets 150 - 400 K/uL 189  207  260        Latest Ref Rng & Units 05/02/2024    5:14 AM 05/01/2024    1:00 PM 03/24/2024    2:06 AM  CMP  Glucose 70 - 99 mg/dL 80  896  883   BUN 8 - 23 mg/dL 39  40  14   Creatinine 0.61 - 1.24 mg/dL 6.82  6.27  8.26   Sodium 135 - 145 mmol/L 140  142  131   Potassium 3.5 - 5.1 mmol/L 4.1  4.6  4.0   Chloride 98 - 111 mmol/L 107  103  98   CO2 22 - 32 mmol/L 21  21  20    Calcium  8.9 - 10.3 mg/dL 8.3  9.1  8.4   Total Protein 6.5 - 8.1 g/dL 5.6  7.1  6.5   Total Bilirubin 0.0 - 1.2 mg/dL 0.9  0.4  0.5   Alkaline Phos 38 - 126 U/L 65  82  75   AST 15 - 41 U/L 40  31  12   ALT 0 - 44 U/L 14  14  8      DG Tibia/Fibula Left Result Date: 05/01/2024 EXAM: 2 VIEW(S) Xray of the Left Tibia and Fibula 05/01/2024 01:56:00 PM COMPARISON: None available. CLINICAL HISTORY: Known osteo Known osteo FINDINGS: BONES AND JOINTS: No acute fracture. No focal osseous lesion. No joint  dislocation. SOFT TISSUES: The soft tissues are unremarkable. IMPRESSION: 1. No significant abnormality. Electronically signed by: Lynwood Seip MD 05/01/2024 02:27 PM EST RP Workstation: [id number]   DG Foot 2 Views Left Result Date: 05/01/2024 EXAM: 2 VIEW(S) XRAY OF THE LEFT FOOT 05/01/2024 01:56:00 PM COMPARISON: 02/23/2024. Only lateral image was submitted for review. CLINICAL HISTORY: known osteo known osteo FINDINGS: BONES AND JOINTS: Status post amputation of distal portions of first and second metatarsals and phalanges. Osteomyelitis cannot be excluded due to the single limited image. No acute fracture. No joint dislocation. SOFT TISSUES: Vascular calcifications are noted. Severe dorsal soft tissue swelling is noted most consistent with infection. IMPRESSION: 1. Severe dorsal soft tissue swelling, suspicious for infection. Osteomyelitis cannot be excluded due to single limited lateral view. 2. 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 Electronically signed by: Lynwood Seip MD 05/01/2024 02:26 PM EST RP Workstation: [id number]   DG Chest Port 1 View Result Date: 05/01/2024 CLINICAL DATA:  Sepsis. EXAM: PORTABLE CHEST 1 VIEW COMPARISON:  03/10/2024 FINDINGS: The heart size and mediastinal contours are within normal limits. Low lung volumes with bibasilar atelectasis. There is no evidence of pulmonary edema, consolidation, pneumothorax or pleural fluid. The visualized skeletal structures are unremarkable. IMPRESSION: Low lung volumes with bibasilar atelectasis. Electronically Signed   By: Marcey Moan M.D.   On: 05/01/2024 14:14    ASSESSMENT/PLAN:  Assessment: Principal Problem:   Sepsis (HCC) Active Problems:   AKI (acute kidney injury)   Hypertensive heart disease with congestive heart failure (HCC)   S/P AKA (above knee amputation) unilateral, right (HCC)   CKD (chronic kidney disease) stage 3, GFR 30-59 ml/min (HCC)   Ileostomy status  (HCC)   Increased anion gap metabolic acidosis   QT prolongation   Hemoglobinuria   Plan: Gregory [name] is a 63 y.o. person living with a history of hypertension, hyperlipidemia, type 2 diabetes on insulin , cervical spinal stenosis, ileostomy,  COPD, right AKA, chronic pain syndrome on multiple narcotics, history of opioid OD, stroke, admission in 12/2023 for GBS bacteremia and encephalopathy. He presented with confusion from [facility] and admitted for sepsis on hospital day 1   #Sepsis likely 2/2 L leg Cellulitis  101.9 degree temp this morning. WBC 11.2. CXR on admission w/o consolidation, UA neg for bacteria, still pending urine cultures. CT on 9/10 of L foot was concerning for active erosion and osteomyelitis. Patient refused MRI and surgical intervention, was treated with 7 days IV Zosyn  and Zyvox , discharged on PO Augmentin . CT foot 11/14 does not have evidence of osteomyelitis. CT tibia fibula 11/14 w/o evidence of osteomyelitis or septic arthritis and consistent with cellulitis. Consulted ortho surg, appreciate recs. Continue broad spec abx with Vanc per pharm and cefepime .  -vancomycin  per pharm, and cefepime , will narrow as able  -Blood cx NG <12hr -urine cxs NG < 12 hr -PT/OT eval -CBC AM   #AKI on CKD3 Of note, his urine is a green-ish color likely 2/2 to home UroMP. Mg 1.8 this AM, supplemented 2 g IV. AG closed. Cr 3.72 on admission, improved to 3.17 today. Likely multifactorial in the setting of known CKD3 and BPH as well as acute sepsis. Ordered a diet today, will monitor for improvement with PO intake and antibiotics  -urine cx pending  -RFP Mg AM -NaHCO3 650 mg BID -atorvastatin  80 mg daily  #Normocytic Anemia  On admission, Hgb 13.5, 8.7 today. It appears his baseline is around 9-10, likely in the setting of CKD3. Ostomy bag not concerning for red or black stools, nor was his stool that was spread over the ED room suspicious for a GI bleed. Moderate Hgb on urine dipstick  although I suspect at least some component of this is due to rentention (scan this AM with 760 mL). Potentially hemoconcentration? Continue to monitor for signs of bleeding, will repeat CBC tonight and check ferritin and iron panel -CBC, Ferritin, Iron panel tonight   #BPH #Hemoglobinuria  Of note, his urine is a green-ish color likely 2/2 to home UroMP. Bladder scan this morning 736 mL, ordered foley catheter and has about 450 mL in his bag. Continue to monitor output -CK pending  -tamsulosin  0.4 mg daily  #Acute Encephalopathic, resolved Likely due to sepsis and resulting metabolic derangements, also on several sedating home medications. Agitation improved in ED with droperidol , haloperidol  and midazolam  -quetiapine  25 mg BID   T2DM On a sliding scale at home. A1c 5.9 two months ago. FBG 84 this morning, was NPO overnight. Started diet today -continue sliding scale    #Congestive HF On admission, BNP 108.8. BP 121/60 and has been stable since arrival. CXR c/w normal heart size and neg for pulm edema. Does not appear volume overloaded, although his leg is concerning for venous stasis with superimposed cellutitis, less suspicious for heart failure exacerbation. Will continue to monitor and hold off on lasix  for now. -hold home Lasix  due to Cr -RFP Mg AM   #Ileostomy On and functioning well, solid brown contents today.  -wound ostomy consulted  -Dicyclomine 20 mg  -lactulose     #COPD RVP neg. Has been alternating between [place] at 2L and room air, effort normal. Low concern for exacerbation at this time.  -albuterol  -fluticasone     Qtc Prolongation  612 ms on admission, today . Likely from home methadone .  -Avoid QTC prolonging medications -Continuous cardiac monitor    Chronic pain and anxiety  Per chart, Hx of multiple hospitalizations due  to opioid overdose. Exam not concerning for acute overdose or withdrawal.  -home methadone  15 mg -oxycodone  5 mg -Buspirone  15 mg  TID -Clonazepam  0.5 mg TID PRN -methocarbamol  500 mg Q6HR PRN -Hold on Doxepin  due to Qtc   Hx of stroke -Continue ASA 81 mg -atorvastatin  80 mg daily   Hx of seizures 11/2023 -Keppra  500 mg BID -Oxcarbazepine  150 mg BID    Best Practice: Diet: heart healthy IVF: Fluids: none, Rate: None VTE: heparin  injection 5,000 Units Start: 05/01/24 1700 Code: Full  Disposition planning: Therapy Recs: Pending, DME: none Family Contact: Daughter, to be notified. DISPO: Anticipated discharge 1-2 days to Skilled nursing facility pending IV antibiotics.  Signature:  Viktoria Charmayne [facility]  7:15 AM, 05/02/2024  On Call pager [phone]

## 2024-05-02 NOTE — Evaluation (Signed)
 Occupational Therapy Evaluation Patient Details Name: Gregory [name] MRN: [id number] DOB: May 24, 1961 Today's Date: 05/02/2024   History of Present Illness   63 yo male from [facility] [facility] admitted 05/01/24 with confusion due to sepsis from LLE cellulitis/ osteomyelitis with refusal of surgical intervention last admission 9/25. PMH includes: DM II, HTN, HLD, CKD II, COPD, s/p R AKA, L midfoot amputation, CVA, Afib, mesenteric ischemia s/p colostomy     Clinical Impressions Pt admitted based on above, and was seen based on problem list below. Pt is a questionable historian d/t lethargy and pain. Reporting PTA living at [facility] and was able to transfer to a w/c and receives assistance for ADLs. He does report difficulty with transfer and has been using a hoyer lift, unsure of how long. Today pt is requiring set up  to max assist for ADLs. Bed mobility was  mod +2 for safety. Pt unable to tolerate sitting EOB for greater than 2 minutes, reporting need to return to supine d/t pain. Will trial on caseload as unsure of pt's true baseline, recommending to return to prior facility at d/c.     If plan is discharge home, recommend the following:   Two people to help with walking and/or transfers;Two people to help with bathing/dressing/bathroom;Assistance with cooking/housework;Supervision due to cognitive status     Functional Status Assessment   Patient has had a recent decline in their functional status and demonstrates the ability to make significant improvements in function in a reasonable and predictable amount of time.     Equipment Recommendations   Other (comment) (Defer to next venue)      Precautions/Restrictions   Precautions Precautions: Fall Recall of Precautions/Restrictions: Impaired Restrictions Weight Bearing Restrictions Per Provider Order: No     Mobility Bed Mobility Overal bed mobility: Needs Assistance Bed Mobility: Supine to Sit, Sit to Supine      Supine to sit: Min assist, +2 for physical assistance Sit to supine: Mod assist, +2 for physical assistance   General bed mobility comments: Heavy reliance on rails +2 for safety    Transfers     General transfer comment: unsafe to attempt      Balance Overall balance assessment: Needs assistance Sitting-balance support: Bilateral upper extremity supported, Feet unsupported Sitting balance-Leahy Scale: Poor Sitting balance - Comments: Pt tolerating <2 minutes EOB         ADL either performed or assessed with clinical judgement   ADL Overall ADL's : Needs assistance/impaired Eating/Feeding: Set up;Sitting   Grooming: Set up;Sitting   Upper Body Bathing: Set up;Sitting   Lower Body Bathing: Maximal assistance;Sitting/lateral leans;Bed level   Upper Body Dressing : Set up;Sitting   Lower Body Dressing: Maximal assistance;Sitting/lateral leans;Bed level                 General ADL Comments: Limited d/t fatigue and pain     Vision   Vision Assessment?: No apparent visual deficits            Pertinent Vitals/Pain Pain Assessment Pain Assessment: Faces Faces Pain Scale: Hurts even more Pain Location: LLE and scrotum Pain Descriptors / Indicators: Discomfort, Grimacing Pain Intervention(s): Limited activity within patient's tolerance     Extremity/Trunk Assessment Upper Extremity Assessment Upper Extremity Assessment: Overall WFL for tasks assessed   Lower Extremity Assessment Lower Extremity Assessment: Defer to PT evaluation RLE Deficits / Details: AKA well healed RLE Sensation: WNL RLE Coordination: WNL LLE Deficits / Details: noted redness knee and below, foot deformity and trophic changes.  Hip flex 3-/5, knee ext 3/5 LLE Sensation: decreased light touch;decreased proprioception;history of peripheral neuropathy LLE Coordination: decreased gross motor   Cervical / Trunk Assessment Cervical / Trunk Assessment: Other exceptions Cervical / Trunk  Exceptions: increased body habitus   Communication Communication Communication: No apparent difficulties   Cognition Arousal: Lethargic Behavior During Therapy: Flat affect Cognition: No family/caregiver present to determine baseline, Difficult to assess Difficult to assess due to: Level of arousal           OT - Cognition Comments: No family present to determine baseline. Pt reporting lethargy and pain limiting. Difficult to understand but deficits in attention and problem solving       Following commands: Impaired Following commands impaired: Follows one step commands inconsistently     Cueing  General Comments   Cueing Techniques: Verbal cues;Gestural cues  Noted swelling and redness in scrotum, repositioned for comfort           Home Living Family/patient expects to be discharged to:: Skilled nursing facility     Additional Comments: from [facility]      Prior Functioning/Environment Prior Level of Function : Needs assist;Patient poor historian/Family not available       Mobility Comments: Pt reports use of w/c primarily. Unsure of how recently was pivoting to chair vs hoyer lift ADLs Comments: Reports able to self feed and groom, assist for bathing and dressing    OT Problem List: Decreased strength;Decreased range of motion;Decreased activity tolerance;Impaired balance (sitting and/or standing);Decreased safety awareness;Decreased knowledge of use of DME or AE;Cardiopulmonary status limiting activity   OT Treatment/Interventions: Therapeutic exercise;Self-care/ADL training;Energy conservation;DME and/or AE instruction;Therapeutic activities;Patient/family education;Balance training      OT Goals(Current goals can be found in the care plan section)   Acute Rehab OT Goals Patient Stated Goal: To go back to [facility] OT Goal Formulation: With patient Time For Goal Achievement: 05/16/24 Potential to Achieve Goals: Good   OT Frequency:  Min  1X/week    Co-evaluation PT/OT/SLP Co-Evaluation/Treatment: Yes Reason for Co-Treatment: Complexity of the patient's impairments (multi-system involvement);Necessary to address cognition/behavior during functional activity;For patient/therapist safety PT goals addressed during session: Mobility/safety with mobility;Balance OT goals addressed during session: ADL's and self-care      AM-PAC OT 6 Clicks Daily Activity     Outcome Measure Help from another person eating meals?: None Help from another person taking care of personal grooming?: A Little Help from another person toileting, which includes using toliet, bedpan, or urinal?: Total Help from another person bathing (including washing, rinsing, drying)?: A Lot Help from another person to put on and taking off regular upper body clothing?: A Little Help from another person to put on and taking off regular lower body clothing?: A Lot 6 Click Score: 15   End of Session Nurse Communication: Mobility status  Activity Tolerance: Patient tolerated treatment well Patient left: in bed;with call bell/phone within reach;with bed alarm set  OT Visit Diagnosis: Unsteadiness on feet (R26.81);Other abnormalities of gait and mobility (R26.89);Muscle weakness (generalized) (M62.81)                Time: 8964-8940 OT Time Calculation (min): 24 min Charges:  OT General Charges $OT Visit: 1 Visit OT Evaluation $OT Eval Moderate Complexity: 1 Mod  Saundra Gin C, OT  Acute Rehabilitation Services Office [phone] Secure chat preferred   Adrianne GORMAN Savers 05/02/2024, 1:31 PM

## 2024-05-02 NOTE — Plan of Care (Signed)

## 2024-05-02 NOTE — Consult Note (Signed)
 WOC Nurse Consult Note: Reason for Consult: buttocks/scrotal wounds  Wound type:1.  Deep Tissue Pressure Injury sacrum/B buttocks purple maroon discoloration some lifting of the epidermis  2.  Intertriginous dermatitis to groin/scrotum with full thickness red moist wounds  ICD-10 CM Codes for Irritant Dermatitis  L24A2 - Due to fecal, urinary or dual incontinence  L30.4  - Erythema intertrigo. Also used for abrasion of the hand, chafing of the skin, dermatitis due to sweating and friction, friction dermatitis, friction eczema, and genital/thigh intertrigo.   Pressure Injury POA: Yes buttocks/sacrum  Measurement: see nursing flowsheet widespread MASD to buttocks/perineum  Wound bed: as above  Drainage (amount, consistency, odor) see nursing flowsheet  Periwound: erythema; significant moisture damage surrounding deep tissue pressure injuries  Dressing procedure/placement/frequency:  Cleanse sacrum/buttocks with Vashe wound cleanser, do not rinse and allow to air dry.  Apply Xeroform gauze to areas of purple discoloration daily and secure with ABD pad.  Cleanse lower buttocks/scrotum/perineum with soap and water , dry and apply Gerhardt's Butt Cream 3 times a day and prn soiling.  Cover any open wound beds to scrotum with Xeroform with folded ABD pad to help hold in place.   Patient should be placed on a low air loss mattress for pressure redistribution and moisture management.    POC discussed with bedside nurse WOC team will not follow  DTPI are high risk to deteriorate, reconsult if needed.       WOC Nurse ostomy consult note Stoma type/location: End ileostomy RUQ Stomal assessment/size: 38 mm, red and viable, 3 cm above the skin level, os in center, hernia peri-ostomy. Peristomal assessment:  Treatment options for stomal/peristomal skin: Ring [id number].  Ostomy pouching: 1pc. #[id number], ring [id number].  Education provided: Old ostomy, pt dependent in care. Bed nurse instructions: - Cleanse  the skin with saline and soft wash cloth or gauze, pat dry the peri-ostomy skin. - Cut the barrier as the ostomy size and shape.  - Around the cut on the barrier, applied the ring, stretching and modeling until match. - Take the plastic protection off, apply to his skin, take the drape protection off, adapting in his abdomen. - Close the lock in roll system. - Empty when is 1/3 full or 1/2 full. - Change the pouch 2 times per week or if is leaking.  WOC team will not follow this established ostomy. REconsult if further needs arise.   Thank you,    Powell Bar MSN, RN-BC, [employer]

## 2024-05-02 NOTE — H&P (Deleted)
 Expand All Collapse All  Internal Medicine Teaching Service Attending Note Date: 05/01/2024   Patient name: Gregory [name]             Medical record number: [id number]            Date of birth: 1961/05/12         I have seen and evaluated Gregory [name] and discussed their care with the Residency Team.   63 yo M with hx of HTN, COPD, DM2 (since 63 yo), CHF, previous CVA, previous R BKA, prev GBS sepsis with osteomyelitis 12-2023, returned 02-2024 with cellulitis, BCx (-) but noted to have osteo and left prior to ortho eval.  He now comes in with 5 hrs of worsened mental status, difficulty with arousal from SNF.  In ED he remained somnolent. His ED course was notable for fever 102.9,the pt removing his ostomy bag and scatolia.  Currently  he is oriented x 3. States his L leg has been swollen for months.    Physical Exam: Blood pressure 121/60, pulse 88, temperature 98.5 F (36.9 C), temperature source Oral, resp. rate 16, height 6' (1.829 m), weight 85 kg, SpO2 94%. General appearance: alert, cooperative, no distress, and uncooperative Eyes: PERRL, blind in L eye Throat: abnormal findings: dry Neck: no adenopathy and supple, symmetrical, trachea midline Resp: clear to auscultation bilaterally Cardio: regular rate and rhythm GI: normal findings: bowel sounds normal and soft, non-tender Extremities: L leg swollen to knee, non-tender, scabbing and scaling, erythema.  Ostomy bag in place.    Lab results: Lab Results Last 24 Hours        Results for orders placed or performed during the hospital encounter of 05/01/24 (from the past 24 hours)  Resp panel by RT-PCR (RSV, Flu A&B, Covid) Anterior Nasal Swab     Status: None    Collection Time: 05/01/24  1:00 PM    Specimen: Anterior Nasal Swab  Result Value Ref Range    SARS Coronavirus 2 by RT PCR NEGATIVE NEGATIVE    Influenza A by PCR NEGATIVE NEGATIVE    Influenza B by PCR NEGATIVE NEGATIVE    Resp Syncytial Virus by PCR NEGATIVE NEGATIVE   Comprehensive metabolic panel     Status: Abnormal    Collection Time: 05/01/24  1:00 PM  Result Value Ref Range    Sodium 142 135 - 145 mmol/L    Potassium 4.6 3.5 - 5.1 mmol/L    Chloride 103 98 - 111 mmol/L    CO2 21 (L) 22 - 32 mmol/L    Glucose, Bld 103 (H) 70 - 99 mg/dL    BUN 40 (H) 8 - 23 mg/dL    Creatinine, Ser 6.27 (H) 0.61 - 1.24 mg/dL    Calcium  9.1 8.9 - 10.3 mg/dL    Total Protein 7.1 6.5 - 8.1 g/dL    Albumin  2.7 (L) 3.5 - 5.0 g/dL    AST 31 15 - 41 U/L    ALT 14 0 - 44 U/L    Alkaline Phosphatase 82 38 - 126 U/L    Total Bilirubin 0.4 0.0 - 1.2 mg/dL    GFR, Estimated 17 (L) >60 mL/min    Anion gap 18 (H) 5 - 15  CBC with Differential     Status: Abnormal    Collection Time: 05/01/24  1:00 PM  Result Value Ref Range    WBC 13.3 (H) 4.0 - 10.5 K/uL    RBC 4.52 4.22 - 5.81 MIL/uL  Hemoglobin 13.5 13.0 - 17.0 g/dL    HCT 55.5 60.9 - 47.9 %    MCV 98.2 80.0 - 100.0 fL    MCH 29.9 26.0 - 34.0 pg    MCHC 30.4 30.0 - 36.0 g/dL    RDW 85.3 88.4 - 84.4 %    Platelets 189 150 - 400 K/uL    nRBC 0.0 0.0 - 0.2 %    Neutrophils Relative % 87 %    Neutro Abs 11.7 (H) 1.7 - 7.7 K/uL    Lymphocytes Relative 8 %    Lymphs Abs 1.1 0.7 - 4.0 K/uL    Monocytes Relative 3 %    Monocytes Absolute 0.4 0.1 - 1.0 K/uL    Eosinophils Relative 1 %    Eosinophils Absolute 0.1 0.0 - 0.5 K/uL    Basophils Relative 0 %    Basophils Absolute 0.0 0.0 - 0.1 K/uL    Immature Granulocytes 1 %    Abs Immature Granulocytes 0.06 0.00 - 0.07 K/uL  Protime-INR     Status: None    Collection Time: 05/01/24  1:00 PM  Result Value Ref Range    Prothrombin Time 13.9 11.4 - 15.2 seconds    INR 1.0 0.8 - 1.2  Brain natriuretic peptide     Status: Abnormal    Collection Time: 05/01/24  1:00 PM  Result Value Ref Range    B Natriuretic Peptide 108.8 (H) 0.0 - 100.0 pg/mL  Urinalysis, w/ Reflex to Culture (Infection Suspected) -Urine, Clean Catch     Status: Abnormal    Collection Time:  05/01/24  1:09 PM  Result Value Ref Range    Specimen Source URINE, CLEAN CATCH      Color, Urine GREEN (A) YELLOW    APPearance HAZY (A) CLEAR    Specific Gravity, Urine 1.018 1.005 - 1.030    pH 5.0 5.0 - 8.0    Glucose, UA NEGATIVE NEGATIVE mg/dL    Hgb urine dipstick MODERATE (A) NEGATIVE    Bilirubin Urine NEGATIVE NEGATIVE    Ketones, ur NEGATIVE NEGATIVE mg/dL    Protein, ur >=699 (A) NEGATIVE mg/dL    Nitrite NEGATIVE NEGATIVE    Leukocytes,Ua NEGATIVE NEGATIVE    RBC / HPF 0-5 0 - 5 RBC/hpf    WBC, UA 11-20 0 - 5 WBC/hpf    Bacteria, UA RARE (A) NONE SEEN    Squamous Epithelial / HPF 0-5 0 - 5 /HPF    Mucus PRESENT    Urine Culture     Status: None (Preliminary result)    Collection Time: 05/01/24  1:09 PM    Specimen: Urine, Clean Catch  Result Value Ref Range    Specimen Description URINE, CLEAN CATCH      Special Requests          NONE Reflexed from [id number] Performed at [facility], [address], [place], [place] [address]      Culture PENDING      Report Status PENDING    I-Stat Lactic Acid, ED     Status: None    Collection Time: 05/01/24  1:20 PM  Result Value Ref Range    Lactic Acid, Venous 1.5 0.5 - 1.9 mmol/L  Rapid urine drug screen (hospital performed)     Status: None    Collection Time: 05/01/24  2:25 PM  Result Value Ref Range    Opiates NONE DETECTED NONE DETECTED    Cocaine NONE DETECTED NONE DETECTED    Benzodiazepines  NONE DETECTED NONE DETECTED    Amphetamines NONE DETECTED NONE DETECTED    Tetrahydrocannabinol NONE DETECTED NONE DETECTED    Barbiturates NONE DETECTED NONE DETECTED        Imaging results:   Imaging Results (Last 48 hours)  DG Tibia/Fibula Left Result Date: 05/01/2024 EXAM: 2 VIEW(S) Xray of the Left Tibia and Fibula 05/01/2024 01:56:00 PM COMPARISON: None available. CLINICAL HISTORY: Known osteo Known osteo FINDINGS: BONES AND JOINTS: No acute fracture. No focal osseous lesion. No joint dislocation. SOFT TISSUES:  The soft tissues are unremarkable. IMPRESSION: 1. No significant abnormality. Electronically signed by: Lynwood Seip MD 05/01/2024 02:27 PM EST RP Workstation: [id number]    DG Foot 2 Views Left Result Date: 05/01/2024 EXAM: 2 VIEW(S) XRAY OF THE LEFT FOOT 05/01/2024 01:56:00 PM COMPARISON: 02/23/2024. Only lateral image was submitted for review. CLINICAL HISTORY: known osteo known osteo FINDINGS: BONES AND JOINTS: Status post amputation of distal portions of first and second metatarsals and phalanges. Osteomyelitis cannot be excluded due to the single limited image. No acute fracture. No joint dislocation. SOFT TISSUES: Vascular calcifications are noted. Severe dorsal soft tissue swelling is noted most consistent with infection. IMPRESSION: 1. Severe dorsal soft tissue swelling, suspicious for infection. Osteomyelitis cannot be excluded due to single limited lateral view. 2. 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 Electronically signed by: Lynwood Seip MD 05/01/2024 02:26 PM EST RP Workstation: [id number]    DG Chest Port 1 View Result Date: 05/01/2024 CLINICAL DATA:  Sepsis. EXAM: PORTABLE CHEST 1 VIEW COMPARISON:  03/10/2024 FINDINGS: The heart size and mediastinal contours are within normal limits. Low lung volumes with bibasilar atelectasis. There is no evidence of pulmonary edema, consolidation, pneumothorax or pleural fluid. The visualized skeletal structures are unremarkable. IMPRESSION: Low lung volumes with bibasilar atelectasis. Electronically Signed   By: Marcey Moan M.D.   On: 05/01/2024 14:14       Assessment and Plan: I agree with the formulated Assessment and Plan with the following changes:  Sepsis (fever, leukocytosis, aki, suspected osteo on L foot on plain film) Cellulitis AKI   CT LE pending Continue broad spectrum anbx Will f/u BCx Hydrate as he tolerates SSI       Eben, Reyes BROCKS, MD

## 2024-05-02 NOTE — Progress Notes (Signed)
 Initial Nutrition Assessment  DOCUMENTATION CODES:  Not applicable  INTERVENTION:  Multivitamin w/ minerals daily Ensure Plus High Protein po BID, each supplement provides 350 kcal and 20 grams of protein Liberalize diet to regular due to increased needs Meal ordering with assistance  Encourage good PO intake   NUTRITION DIAGNOSIS:  Increased nutrient needs related to chronic illness, wound healing as evidenced by estimated needs.  GOAL:  Patient will meet greater than or equal to 90% of their needs  MONITOR:  PO intake, Supplement acceptance, Weight trends, Labs  REASON FOR ASSESSMENT:  Consult Assessment of nutrition requirement/status  ASSESSMENT:  63 y.o. male presented to the ED from [facility] with confusion. PMH includes COPD, CHF, R AKA, GERD, T2DM, HTN, HLD, CKD IIIb, BPH, CVA, malnutrition, and s/p ileostomy. Pt admitted with sepsis likely from L foot osteomyelitis, AKI on CKD, and acute encephalopathic.   11/14 - Admitted   RD working remotely at time of assessment. Discussed with RN, pt eating about 40%. RD informed RN that oral nutrition supplements were ordered to assist in PO intake.   Per chart review, pt with weight gain from January to July of this year. Current weight documented has remained the same since early September, suspect that weight is being copied forward from previous encounters.   Admission/Current Weight: 85 kg  Nutrition Related Medications: Bentyl, NovoLog  0-20 units TID + 0-5 units daily, Lactulose , Sodium Bicarbonate , IV Zosyn  Labs: Sodium 140, Potassium 4.1, BUN 39, Creatinine 3.17, Magnesium  1.8, Hgb A1c 5.9 (02/24/24)  CBG: 65-90 mg/dL x 24 hrs   NUTRITION - FOCUSED PHYSICAL EXAM: Deferred to follow-up.   Diet Order:   Diet Order             Diet regular Room service appropriate? Yes with Assist; Fluid consistency: Thin  Diet effective now                  EDUCATION NEEDS:  No education needs have been identified at this  time  Skin:  Skin Assessment: Skin Integrity Issues: Skin Integrity Issues:: DTI DTI: Sacrum; R buttocks  Last BM:  11/15  Height:  Ht Readings from Last 1 Encounters:  05/01/24 6' (1.829 m)   Weight:  Wt Readings from Last 1 Encounters:  05/01/24 85 kg   Ideal Body Weight:  80.9 kg  BMI:  Body mass index is 25.41 kg/m. Adjusted BMI for AKA: 27.9 kg/m2  Estimated Nutritional Needs:  Kcal:  2100-2300 Protein:  100-120 grams Fluid:  >/= 2 L   Nestora Glatter RD, LDN Clinical Dietitian

## 2024-05-02 NOTE — Progress Notes (Signed)
  I was paged by nursing due to concerns about patient agitation and violent behavior. I evaluated the patient at bedside.  On examination, the patient was visibly agitated and repeatedly attempting to get out of bed. He persistently reached for his Foley catheter with the intent to remove it. When questioned, he stated that he wanted to leave the unit to smoke and promised he would return. He had already received his scheduled clonazepam  at 16:44. Redirection was attempted but was ineffective. Although his quetiapine  was not due until 22:00, an early dose was administered to help manage agitation. A sitter was ordered for patient and staff safety. The patient appeared slightly calmer prior to my leaving the bedside.  A few minutes later, nursing staff notified me that the patient remained agitated and continued attempting to get out of bed. For patient safety, soft restraints were ordered.   Gregory Grow  MD 05/02/2024, 6:15 PM  Pager: [phone] After 5pm or weekend: [phone]

## 2024-05-03 ENCOUNTER — Inpatient Hospital Stay (HOSPITAL_COMMUNITY)

## 2024-05-03 DIAGNOSIS — R748 Abnormal levels of other serum enzymes: Secondary | ICD-10-CM | POA: Insufficient documentation

## 2024-05-03 DIAGNOSIS — R6520 Severe sepsis without septic shock: Secondary | ICD-10-CM | POA: Diagnosis not present

## 2024-05-03 DIAGNOSIS — N179 Acute kidney failure, unspecified: Secondary | ICD-10-CM | POA: Diagnosis not present

## 2024-05-03 DIAGNOSIS — M7989 Other specified soft tissue disorders: Secondary | ICD-10-CM

## 2024-05-03 DIAGNOSIS — L03116 Cellulitis of left lower limb: Secondary | ICD-10-CM | POA: Diagnosis not present

## 2024-05-03 DIAGNOSIS — A419 Sepsis, unspecified organism: Secondary | ICD-10-CM | POA: Diagnosis not present

## 2024-05-03 DIAGNOSIS — R52 Pain, unspecified: Secondary | ICD-10-CM

## 2024-05-03 LAB — RENAL FUNCTION PANEL
Albumin: 2 g/dL — ABNORMAL LOW (ref 3.5–5.0)
Anion gap: 16 — ABNORMAL HIGH (ref 5–15)
BUN: 44 mg/dL — ABNORMAL HIGH (ref 8–23)
CO2: 17 mmol/L — ABNORMAL LOW (ref 22–32)
Calcium: 8.2 mg/dL — ABNORMAL LOW (ref 8.9–10.3)
Chloride: 106 mmol/L (ref 98–111)
Creatinine, Ser: 3.14 mg/dL — ABNORMAL HIGH (ref 0.61–1.24)
GFR, Estimated: 21 mL/min — ABNORMAL LOW (ref >60)
Glucose, Bld: 91 mg/dL (ref 70–99)
Phosphorus: 4.5 mg/dL (ref 2.5–4.6)
Potassium: 3.8 mmol/L (ref 3.5–5.1)
Sodium: 139 mmol/L (ref 135–145)

## 2024-05-03 LAB — CBC
HCT: 28 % — ABNORMAL LOW (ref 39.0–52.0)
Hemoglobin: 8.7 g/dL — ABNORMAL LOW (ref 13.0–17.0)
MCH: 30.6 pg (ref 26.0–34.0)
MCHC: 31.1 g/dL (ref 30.0–36.0)
MCV: 98.6 fL (ref 80.0–100.0)
Platelets: 147 K/uL — ABNORMAL LOW (ref 150–400)
RBC: 2.84 MIL/uL — ABNORMAL LOW (ref 4.22–5.81)
RDW: 14.1 % (ref 11.5–15.5)
WBC: 8.3 K/uL (ref 4.0–10.5)
nRBC: 0 % (ref 0.0–0.2)

## 2024-05-03 LAB — LACTIC ACID, PLASMA: Lactic Acid, Venous: 0.9 mmol/L (ref 0.5–1.9)

## 2024-05-03 LAB — GLUCOSE, CAPILLARY
Glucose-Capillary: 87 mg/dL (ref 70–99)
Glucose-Capillary: 99 mg/dL (ref 70–99)
Glucose-Capillary: 63 mg/dL — ABNORMAL LOW (ref 70–99)
Glucose-Capillary: 81 mg/dL (ref 70–99)
Glucose-Capillary: 115 mg/dL — ABNORMAL HIGH (ref 70–99)
Glucose-Capillary: 128 mg/dL — ABNORMAL HIGH (ref 70–99)

## 2024-05-03 LAB — MAGNESIUM: Magnesium: 1.9 mg/dL (ref 1.7–2.4)

## 2024-05-03 LAB — CK: Total CK: 2409 U/L — ABNORMAL HIGH (ref 49–397)

## 2024-05-03 MED ORDER — LACTATED RINGERS IV BOLUS
1000.0000 mL | Freq: Once | INTRAVENOUS | Status: AC
  Administered 2024-05-03: 1000 mL via INTRAVENOUS

## 2024-05-03 MED ORDER — OLANZAPINE 5 MG PO TABS: 5.0000 mg | ORAL_TABLET | Freq: Two times a day (BID) | ORAL | Status: DC | PRN

## 2024-05-03 MED ORDER — OXYCODONE HCL 5 MG PO TABS
10.0000 mg | ORAL_TABLET | Freq: Four times a day (QID) | ORAL | Status: DC | PRN
Start: 2024-05-03 — End: 2024-05-04
  Filled 2024-05-03: qty 2

## 2024-05-03 MED ORDER — INSULIN ASPART 100 UNIT/ML IJ SOLN: 0.0000 [IU] | Freq: Three times a day (TID) | INTRAMUSCULAR | Status: DC

## 2024-05-03 MED ORDER — CLONAZEPAM 0.5 MG PO TABS: 1.0000 mg | ORAL_TABLET | Freq: Three times a day (TID) | ORAL | Status: DC | PRN

## 2024-05-03 MED ORDER — QUETIAPINE 12.5 MG HALF TABLET
25.0000 mg | ORAL_TABLET | Freq: Two times a day (BID) | ORAL | Status: DC
  Administered 2024-05-03 (×2): 25 mg via ORAL
  Filled 2024-05-03 (×2): qty 2

## 2024-05-03 MED ORDER — OLANZAPINE 10 MG IM SOLR
5.0000 mg | Freq: Two times a day (BID) | INTRAMUSCULAR | Status: DC | PRN
Start: 2024-05-03 — End: 2024-05-03

## 2024-05-03 MED ORDER — LACTATED RINGERS IV BOLUS: 1000.0000 mL | Freq: Once | INTRAVENOUS | Status: DC

## 2024-05-03 MED ORDER — CLONAZEPAM 0.5 MG PO TABS
0.5000 mg | ORAL_TABLET | Freq: Three times a day (TID) | ORAL | Status: DC | PRN
  Administered 2024-05-03 – 2024-05-04 (×5): 0.5 mg via ORAL
  Filled 2024-05-03 (×5): qty 1

## 2024-05-03 NOTE — Progress Notes (Signed)
 Patient has gotten slightly more calm, but continues to yell for help off and on. Was given a snack and a drink.  Continues to pull Tele leads off

## 2024-05-03 NOTE — Progress Notes (Signed)
 VASCULAR LAB    Left lower extremity venous duplex has been performed.  See CV proc for preliminary results.   Maxemiliano Riel, RVT 05/03/2024, 3:03 PM

## 2024-05-03 NOTE — Plan of Care (Signed)

## 2024-05-03 NOTE — Care Plan (Signed)
 0700 MD at bedside, checking on pt with new orders made and carried our. Restraints orders were DC. Patient was able to be redirected and reorient at the moment. Tele-sitter ongoing [phone] able to swallow meds. Checked blood sugar 0953 BP was checked, 75/44, rechecked at 76/52. MD notified with new orders made and carried out, on their way to assess patient. Patient was drowsy but responsive to voice and able to answer questions. Bolus given 1132 rechecked BP 88/45. Notified MD. Another bolus was given and notified that urine was after bolus.  1200 pt is currently eating but when checked the sugar, it was 63. Orange juice was given. Rpt checked was 81 and pt still currently eating Patient's daughter came to visit. Patient was currently calm. PRN klonopin  given. Pt has still episode of shouting in the room. Notified the provider abt the shift output and aware of it. Call bell within reached. Bed alarm on.

## 2024-05-03 NOTE — Progress Notes (Signed)
 Patient continues to shout for help off and on. Wrist restraints on and patient assessed q15 min.

## 2024-05-03 NOTE — Progress Notes (Signed)
 Continues to call out for help. Patient has had his evening meds and took them with no problem. We were able to get his Tele leads back on and so far they are still in place. He also allowed us  to put a gown on him. He refused a snack but did accept a drink. Is a bit more calm than earlier but still reaching and attempting to pull catheter line, etc.

## 2024-05-03 NOTE — Progress Notes (Addendum)
 Evaluated patient at bedside for hypotension, BP of 76/52 with a MAP of 61.  At bedside, patient awakens to voice and is oriented to self, place, and time. He denies dizziness, lightheadedness at this time. His only complaint is pain in the R stump.   Vitals:   05/03/24 0015 05/03/24 0609 05/03/24 0953 05/03/24 1002  BP: 132/68 136/64 (!) 75/44 (!) 76/52  Pulse: 64 86 69 65  Temp: 98.2 F (36.8 C) 99.3 F (37.4 C) 98.9 F (37.2 C)   Resp: 16 18 15    Height:      Weight:      SpO2: 97% 97% 98%   TempSrc: Oral Oral Oral   BMI (Calculated):        Physical Exam: General: Laying in bed with eyes closed: snoring and twitching in his sleep, awakens to voice Cardiac: Regular rate on tele, No JVD, No pitting edema of the LLE, LLE is warm Abdominal: R sided ostomy in place with green colored stool in the bag Neuro:awakens to voice, oriented to voice, place, time MSK: Erythema of the LLE, swelling and erythema have greatly improved since the day prior  Skin:warm and dry  Suspect his hypotension is due to poor volume intake/ decrease intravascular volume. Less likely due to worsening sepsis as his leukocytosis has resolved, he remains afebrile, and his LLE exam has improved in swelling and erythema. Less likely due to low cardiac output: he has no evidence of volume overload and extremities are warm. Less likely due to blood loss, as his hemoglobin has remained stable with ranges of 9-8 and there is no evidence of overt bleeding at this time. Note that his admission Hgb was 13.5 but this is not consistent with his known normocytic anemia and likely had hemoconcentration then.   Plan: -Lactic acid ordered -1L LR bolus ordered, may required additional IVF throughout the day  -Monitor Ostomy output   Damien Lease, DO Internal Medicine Resident: PGY-2 Please contact the on call pager at: [phone]

## 2024-05-03 NOTE — Progress Notes (Signed)
Resting at this time with eyes closed

## 2024-05-03 NOTE — Progress Notes (Signed)
 HD#2 SUBJECTIVE:  Patient Summary: Gregory [name] is a 63 y.o. with a pertinent PMH of hypertension, COPD, diabetes, CHF, stroke, right AKA, left toe amputations, with known osteomyelitis, who presented with confusion and admitted for sepsis.   Interval History: 11/16 Yesterday, patient was placed in restraints, progress note from Dr. Renne detailing reason. Vital signs have been stable, morning labs showed improved WBC, elevated CK, 240, Cr 3.14, Mag 1.9, Anion gap 16.   On interview, restraints were removed, and patient voiced the importance of continuing with medical treatment. Denied fever, SOB, chest pain. Complained of nasal discomfort which patient attributes to overusing Afrin nasal spray. Reports needing more Klonopin  and that his pain in his R stump is uncontrolled. Denies pain in his left leg.  After rounds, pt BP dropped to 70s/50s, and pt was seen again at bedside. Less alert, but oriented to self, location, and month. Confused. Fluid bolus ordered, monitoring closely.  Continuing abx   OBJECTIVE:  Vital Signs: Vitals:   05/03/24 0015 05/03/24 0609 05/03/24 0953 05/03/24 1002  BP: 132/68 136/64 (!) 75/44 (!) 76/52  Pulse: 64 86 69 65  Resp: 16 18 15    Temp: 98.2 F (36.8 C) 99.3 F (37.4 C) 98.9 F (37.2 C)   TempSrc: Oral Oral Oral   SpO2: 97% 97% 98%   Weight:      Height:       Supplemental O2: Nasal Cannula SpO2: 98 % O2 Flow Rate (L/min): 3 L/min FiO2 (%): (!) 2 %  Filed Weights   05/01/24 1348  Weight: 85 kg     Intake/Output Summary (Last 24 hours) at 05/03/2024 1041 Last data filed at 05/03/2024 0805 Gross per 24 hour  Intake 1137.5 ml  Output 2200 ml  Net -1062.5 ml   Net IO Since Admission: -911.35 mL [05/03/24 1041]  Physical Exam: Physical Exam Vitals reviewed.  Constitutional:      Appearance: He is not ill-appearing.  HENT:     Nose:     Comments: [place] in Cardiovascular:     Pulses: Normal pulses.  Pulmonary:     Effort:  Pulmonary effort is normal.  Abdominal:     Comments: Ostomy bad in place with small amount of solid brown stool  Musculoskeletal:        General: No tenderness.     Comments: R AKA without swelling or erythema. Normal appearing scar although tender to light touch L leg erythematous with hyperkeratotic scale and transudate, improved from yesterday per Dr. Kandis L foot with two toe amputations   Neurological:     Mental Status: He is alert.  Psychiatric:        Mood and Affect: Mood normal.     Patient Lines/Drains/Airways Status     Active Line/Drains/Airways     Name Placement date Placement time Site Days   Peripheral IV 05/01/24 Right Antecubital 05/01/24  1312  Antecubital  1   Peripheral IV 05/01/24 20 G 2.5 Left;Anterior Forearm 05/01/24  1450  Forearm  1   Ileostomy Continent (Abdominal pouch) RUQ 12/04/22  0001  RUQ  515            Pertinent labs and imaging:  Bladder scan 736 mL this AM Mg 1.8    Latest Ref Rng & Units 05/03/2024    7:38 AM 05/02/2024    4:56 PM 05/02/2024    8:21 AM  CBC  WBC 4.0 - 10.5 K/uL 8.3  7.9  11.2   Hemoglobin 13.0 -  17.0 g/dL 8.7  9.3  8.7   Hematocrit 39.0 - 52.0 % 28.0  29.1  27.7   Platelets 150 - 400 K/uL 147  141  155        Latest Ref Rng & Units 05/03/2024    7:38 AM 05/02/2024    5:14 AM 05/01/2024    1:00 PM  CMP  Glucose 70 - 99 mg/dL 91  80  896   BUN 8 - 23 mg/dL 44  39  40   Creatinine 0.61 - 1.24 mg/dL 6.85  6.82  6.27   Sodium 135 - 145 mmol/L 139  140  142   Potassium 3.5 - 5.1 mmol/L 3.8  4.1  4.6   Chloride 98 - 111 mmol/L 106  107  103   CO2 22 - 32 mmol/L 17  21  21    Calcium  8.9 - 10.3 mg/dL 8.2  8.3  9.1   Total Protein 6.5 - 8.1 g/dL  5.6  7.1   Total Bilirubin 0.0 - 1.2 mg/dL  0.9  0.4   Alkaline Phos 38 - 126 U/L  65  82   AST 15 - 41 U/L  40  31   ALT 0 - 44 U/L  14  14     No results found.   ASSESSMENT/PLAN:  Assessment: Principal Problem:   Sepsis (HCC) Active Problems:   AKI  (acute kidney injury)   Hypertensive heart disease with congestive heart failure (HCC)   S/P AKA (above knee amputation) unilateral, right (HCC)   CKD (chronic kidney disease) stage 3, GFR 30-59 ml/min (HCC)   Ileostomy status (HCC)   Increased anion gap metabolic acidosis   QT prolongation   Hemoglobinuria   Elevated CK  Plan: [name] is a 63 y.o. person living with a history of hypertension, hyperlipidemia, type 2 diabetes on insulin , cervical spinal stenosis, ileostomy, COPD, right AKA, chronic pain syndrome on multiple narcotics, history of opioid OD, stroke, admission in 12/2023 for GBS bacteremia and encephalopathy. He presented with confusion from [facility] and admitted for sepsis on hospital day 1   #Sepsis likely 2/2 L leg Cellulitis  Temperature fine overnight, WBC improved to 8.3 from 13.3 on 11/14.  Urine and Blood cultures negative to date.  9/10: CT of L foot was concerning for active erosion and osteomyelitis. Patient refused MRI and surgical intervention, was treated with 7 days IV Zosyn  and Zyvox , discharged on PO Augmentin . 11/14: CT foot does not have evidence of osteomyelitis. CT tibia fibula 11/14 w/o evidence of osteomyelitis or septic arthritis and consistent with cellulitis. Consulted ortho surg, appreciate recs. Continue broad spec abx with Vanc per pharm and cefepime .  - Zosyn  and Vancomycin  per pharm  - Monitoring Urine and Blood cultures -PT/OT eval -CBC AM   #AKI on CKD3 Cr 3.14 from 3.72 on 11/14. Likely multifactorial in the setting of known CKD3 and BPH as well as acute sepsis. AG 16. CK 2409.  -RFP Mg AM -NaHCO3 650 mg BID -atorvastatin  80 mg daily - Fluid  #Normocytic Anemia  On admission, Hgb 13.5, 8.7 today. It appears his baseline is around 9-10, likely in the setting of CKD3. Ostomy bag not concerning for red or black stools, nor was his stool that was spread over the ED room suspicious for a GI bleed. Moderate Hgb on urine dipstick although  I suspect at least some component of this is due to rentention (scan this AM with 760 mL). Potentially hemoconcentration? Continue to monitor  for signs of bleeding, will repeat CBC tonight and check ferritin and iron panel -CBC, Ferritin, Iron panel tonight   # BPH # Hemoglobinuria  # Elevated CK Of note, his urine is a green-ish color likely 2/2 to home UroMP. Bladder scan this morning 736 mL, ordered foley catheter and has about 450 mL in his bag. Continue to monitor output. CK 2409, suspected from immobility and dehydration.  - Recheck CK 11/17 - Tamsulosin  0.4 mg daily - 1 L Fluid bolus  #Acute Encephalopathic, resolved Likely due to sepsis and resulting metabolic derangements, also on several sedating home medications. Agitation improved in ED with droperidol , haloperidol  and midazolam  -quetiapine  25 mg BID   T2DM On a sliding scale at home. A1c 5.9 two months ago. FBG 84 this morning, was NPO overnight. Started diet today -continue sliding scale    #Congestive HF On admission, BNP 108.8. BP 121/60 and has been stable since arrival. CXR c/w normal heart size and neg for pulm edema. Does not appear volume overloaded, although his leg is concerning for venous stasis with superimposed cellutitis, less suspicious for heart failure exacerbation. Will continue to monitor and hold off on lasix  for now. -hold home Lasix  due to Cr -RFP Mg AM   #Ileostomy On and functioning well.  -wound ostomy consulted  -Dicyclomine 20 mg  -lactulose   - Floor staff to inform us  if he requires 3+ changes per shift.   #COPD RVP neg. Has been alternating between [place] at 2L and room air, effort normal. Low concern for exacerbation at this time.  -albuterol  -fluticasone     Qtc Prolongation  612 ms on admission, on 11/16. Likely from home methadone .  - Avoid QTC prolonging medications - Continuous cardiac monitor    Chronic pain and anxiety  Per chart, Hx of multiple hospitalizations due to  opioid overdose. Exam not concerning for acute overdose or withdrawal.  -home methadone  15 mg -oxycodone  10 mg -Buspirone  15 mg TID -Clonazepam  1 mg TID PRN -methocarbamol  500 mg Q6HR PRN -Hold on Doxepin  due to Qtc   Hx of stroke -Continue ASA 81 mg -atorvastatin  80 mg daily   Hx of seizures 11/2023 - Keppra  500 mg BID - Oxcarbazepine  150 mg BID   Best Practice: Diet: heart healthy IVF: Fluids: none, Rate: None VTE: heparin  injection 5,000 Units Start: 05/01/24 1700 Code: Full  Disposition planning: Therapy Recs: Pending, DME: none Family Contact: Daughter, to be notified. DISPO: Anticipated discharge 1-2 days to Skilled nursing facility pending IV antibiotics.  Signature:  Penne Mori; DO - PGY1 [facility]  10:41 AM, 05/03/2024  On Call pager [phone]

## 2024-05-03 NOTE — Plan of Care (Signed)
  Problem: Education: Goal: Knowledge of General Education information will improve Description: Including pain rating scale, medication(s)/side effects and non-pharmacologic comfort measures Outcome: Progressing   Problem: Health Behavior/Discharge Planning: Goal: Ability to manage health-related needs will improve Outcome: Progressing   Problem: Clinical Measurements: Goal: Ability to maintain clinical measurements within normal limits will improve Outcome: Progressing Goal: Will remain free from infection Outcome: Progressing Goal: Diagnostic test results will improve Outcome: Progressing Goal: Respiratory complications will improve Outcome: Progressing Goal: Cardiovascular complication will be avoided Outcome: Progressing   Problem: Activity: Goal: Risk for activity intolerance will decrease Outcome: Progressing   Problem: Nutrition: Goal: Adequate nutrition will be maintained Outcome: Progressing   Problem: Coping: Goal: Level of anxiety will decrease Outcome: Progressing   Problem: Elimination: Goal: Will not experience complications related to bowel motility Outcome: Progressing Goal: Will not experience complications related to urinary retention Outcome: Progressing   Problem: Pain Managment: Goal: General experience of comfort will improve and/or be controlled Outcome: Progressing   Problem: Safety: Goal: Ability to remain free from injury will improve Outcome: Progressing   Problem: Skin Integrity: Goal: Risk for impaired skin integrity will decrease Outcome: Progressing   Problem: Education: Goal: Ability to describe self-care measures that may prevent or decrease complications (Diabetes Survival Skills Education) will improve Outcome: Progressing Goal: Individualized Educational Video(s) Outcome: Progressing   Problem: Coping: Goal: Ability to adjust to condition or change in health will improve Outcome: Progressing   Problem: Fluid  Volume: Goal: Ability to maintain a balanced intake and output will improve Outcome: Progressing   Problem: Health Behavior/Discharge Planning: Goal: Ability to identify and utilize available resources and services will improve Outcome: Progressing Goal: Ability to manage health-related needs will improve Outcome: Progressing   Problem: Metabolic: Goal: Ability to maintain appropriate glucose levels will improve Outcome: Progressing   Problem: Nutritional: Goal: Maintenance of adequate nutrition will improve Outcome: Progressing Goal: Progress toward achieving an optimal weight will improve Outcome: Progressing   Problem: Skin Integrity: Goal: Risk for impaired skin integrity will decrease Outcome: Progressing   Problem: Tissue Perfusion: Goal: Adequacy of tissue perfusion will improve Outcome: Progressing   Problem: Safety: Goal: Violent Restraint(s) Outcome: Progressing

## 2024-05-04 DIAGNOSIS — Z89611 Acquired absence of right leg above knee: Secondary | ICD-10-CM

## 2024-05-04 DIAGNOSIS — E11628 Type 2 diabetes mellitus with other skin complications: Secondary | ICD-10-CM

## 2024-05-04 DIAGNOSIS — R6520 Severe sepsis without septic shock: Secondary | ICD-10-CM | POA: Diagnosis not present

## 2024-05-04 DIAGNOSIS — Z792 Long term (current) use of antibiotics: Secondary | ICD-10-CM

## 2024-05-04 DIAGNOSIS — A419 Sepsis, unspecified organism: Secondary | ICD-10-CM | POA: Diagnosis not present

## 2024-05-04 DIAGNOSIS — Z89422 Acquired absence of other left toe(s): Secondary | ICD-10-CM

## 2024-05-04 DIAGNOSIS — Z79899 Other long term (current) drug therapy: Secondary | ICD-10-CM

## 2024-05-04 DIAGNOSIS — L03116 Cellulitis of left lower limb: Secondary | ICD-10-CM | POA: Diagnosis not present

## 2024-05-04 LAB — RENAL FUNCTION PANEL
Albumin: 1.7 g/dL — ABNORMAL LOW (ref 3.5–5.0)
Anion gap: 8 (ref 5–15)
BUN: 49 mg/dL — ABNORMAL HIGH (ref 8–23)
CO2: 22 mmol/L (ref 22–32)
Calcium: 7.9 mg/dL — ABNORMAL LOW (ref 8.9–10.3)
Chloride: 106 mmol/L (ref 98–111)
Creatinine, Ser: 3.32 mg/dL — ABNORMAL HIGH (ref 0.61–1.24)
GFR, Estimated: 20 mL/min — ABNORMAL LOW (ref >60)
Glucose, Bld: 88 mg/dL (ref 70–99)
Phosphorus: 3.8 mg/dL (ref 2.5–4.6)
Potassium: 3.5 mmol/L (ref 3.5–5.1)
Sodium: 136 mmol/L (ref 135–145)

## 2024-05-04 LAB — GLUCOSE, CAPILLARY
Glucose-Capillary: 131 mg/dL — ABNORMAL HIGH (ref 70–99)
Glucose-Capillary: 132 mg/dL — ABNORMAL HIGH (ref 70–99)
Glucose-Capillary: 115 mg/dL — ABNORMAL HIGH (ref 70–99)
Glucose-Capillary: 115 mg/dL — ABNORMAL HIGH (ref 70–99)

## 2024-05-04 LAB — CBC
HCT: 22.3 % — ABNORMAL LOW (ref 39.0–52.0)
Hemoglobin: 7.2 g/dL — ABNORMAL LOW (ref 13.0–17.0)
MCH: 30.5 pg (ref 26.0–34.0)
MCHC: 32.3 g/dL (ref 30.0–36.0)
MCV: 94.5 fL (ref 80.0–100.0)
Platelets: 130 K/uL — ABNORMAL LOW (ref 150–400)
RBC: 2.36 MIL/uL — ABNORMAL LOW (ref 4.22–5.81)
RDW: 14.2 % (ref 11.5–15.5)
WBC: 5.7 K/uL (ref 4.0–10.5)
nRBC: 0.9 % — ABNORMAL HIGH (ref 0.0–0.2)

## 2024-05-04 LAB — BLOOD GAS, VENOUS
Acid-base deficit: 2.9 mmol/L — ABNORMAL HIGH (ref 0.0–2.0)
Bicarbonate: 22.6 mmol/L (ref 20.0–28.0)
O2 Saturation: 84.9 %
Patient temperature: 37.2
pCO2, Ven: 41 mmHg — ABNORMAL LOW (ref 44–60)
pH, Ven: 7.35 (ref 7.25–7.43)
pO2, Ven: 52 mmHg — ABNORMAL HIGH (ref 32–45)

## 2024-05-04 LAB — LACTIC ACID, PLASMA
Lactic Acid, Venous: 0.7 mmol/L (ref 0.5–1.9)
Lactic Acid, Venous: 1.3 mmol/L (ref 0.5–1.9)

## 2024-05-04 LAB — HEPATIC FUNCTION PANEL
ALT: 17 U/L (ref 0–44)
AST: 41 U/L (ref 15–41)
Albumin: 1.7 g/dL — ABNORMAL LOW (ref 3.5–5.0)
Alkaline Phosphatase: 67 U/L (ref 38–126)
Bilirubin, Direct: 0.1 mg/dL (ref 0.0–0.2)
Indirect Bilirubin: 0.8 mg/dL (ref 0.3–0.9)
Total Bilirubin: 0.9 mg/dL (ref 0.0–1.2)
Total Protein: 5.1 g/dL — ABNORMAL LOW (ref 6.5–8.1)

## 2024-05-04 LAB — BRAIN NATRIURETIC PEPTIDE: B Natriuretic Peptide: 78.3 pg/mL (ref 0.0–100.0)

## 2024-05-04 LAB — MAGNESIUM: Magnesium: 2 mg/dL (ref 1.7–2.4)

## 2024-05-04 LAB — CK: Total CK: 1687 U/L — ABNORMAL HIGH (ref 49–397)

## 2024-05-04 MED ORDER — CEFAZOLIN SODIUM-DEXTROSE 2-4 GM/100ML-% IV SOLN
2.0000 g | Freq: Two times a day (BID) | INTRAVENOUS | Status: DC
  Administered 2024-05-04: 2 g via INTRAVENOUS
  Filled 2024-05-04: qty 100

## 2024-05-04 MED ORDER — LACTATED RINGERS IV SOLN: INTRAVENOUS | Status: AC

## 2024-05-04 MED ORDER — LACTATED RINGERS IV BOLUS
1000.0000 mL | Freq: Once | INTRAVENOUS | Status: AC
  Administered 2024-05-04: 1000 mL via INTRAVENOUS

## 2024-05-04 MED ORDER — QUETIAPINE FUMARATE 50 MG PO TABS
50.0000 mg | ORAL_TABLET | Freq: Two times a day (BID) | ORAL | Status: DC
  Administered 2024-05-04 – 2024-05-05 (×3): 50 mg via ORAL
  Filled 2024-05-04 (×3): qty 1

## 2024-05-04 MED ORDER — ACETAMINOPHEN 500 MG PO TABS
1000.0000 mg | ORAL_TABLET | Freq: Four times a day (QID) | ORAL | Status: DC
  Administered 2024-05-04 – 2024-05-05 (×5): 1000 mg via ORAL
  Filled 2024-05-04 (×5): qty 2

## 2024-05-04 MED ORDER — TRIMETHOBENZAMIDE HCL 100 MG/ML IM SOLN
200.0000 mg | Freq: Three times a day (TID) | INTRAMUSCULAR | Status: DC | PRN
  Administered 2024-05-04: 200 mg via INTRAMUSCULAR
  Filled 2024-05-04 (×3): qty 2

## 2024-05-04 MED ORDER — CEFAZOLIN SODIUM-DEXTROSE 2-4 GM/100ML-% IV SOLN
2.0000 g | Freq: Every day | INTRAVENOUS | Status: DC
  Administered 2024-05-04: 2 g via INTRAVENOUS
  Filled 2024-05-04: qty 100

## 2024-05-04 MED ORDER — PREGABALIN 25 MG PO CAPS
25.0000 mg | ORAL_CAPSULE | Freq: Every day | ORAL | Status: DC
  Administered 2024-05-04 – 2024-05-05 (×2): 25 mg via ORAL
  Filled 2024-05-04 (×2): qty 1

## 2024-05-04 MED ORDER — HYDROXYZINE HCL 25 MG PO TABS
25.0000 mg | ORAL_TABLET | Freq: Three times a day (TID) | ORAL | Status: DC | PRN
  Administered 2024-05-04: 25 mg via ORAL
  Filled 2024-05-04: qty 1

## 2024-05-04 NOTE — Plan of Care (Signed)
  Problem: Education: Goal: Knowledge of General Education information will improve Description: Including pain rating scale, medication(s)/side effects and non-pharmacologic comfort measures Outcome: Progressing   Problem: Clinical Measurements: Goal: Ability to maintain clinical measurements within normal limits will improve Outcome: Progressing Goal: Will remain free from infection Outcome: Progressing Goal: Diagnostic test results will improve Outcome: Progressing   Problem: Nutrition: Goal: Adequate nutrition will be maintained Outcome: Progressing   Problem: Coping: Goal: Level of anxiety will decrease Outcome: Progressing

## 2024-05-04 NOTE — Progress Notes (Signed)
 HD#3 SUBJECTIVE:  Patient Summary: Gregory [name] is a 63 y.o. with a pertinent PMH of hypertension, COPD, diabetes, CHF, stroke, right AKA, left toe amputations, with known osteomyelitis, who presented with confusion and admitted for sepsis.   Interval History: 11/17 There were no acute events overnight. Vital signs showed decreasing BP to MAP 53, which has steadied back into the 60s, and morning labs were notable for Hgb from 8.7 to 7.2, but otherwise CK improved to 1687 from 2409, and Cr is at 3.32.  On interview, patient complains of ongoing pain in L leg, R stump, and in the groin. Denies abdominal pain, and would like to have an increase in Oxycodone . We explained that we have to be careful with pain medications that can affect his BP and that we were making changes to optimize pain control without negatively impacting his BP.  De-escalating Abx and adjusting pain/agitation management in setting of low BP.     OBJECTIVE:  Vital Signs: Vitals:   05/04/24 0300 05/04/24 0400 05/04/24 0623 05/04/24 1037  BP: (!) 82/46 (!) 114/50 (!) 108/44 (!) 116/49  Pulse: 60 67 71 71  Resp:    16  Temp: 98.9 F (37.2 C)   98.3 F (36.8 C)  TempSrc:    Oral  SpO2: 95% 95% 94% 95%  Weight:      Height:       Supplemental O2: Nasal Cannula SpO2: 95 % O2 Flow Rate (L/min): 2 L/min FiO2 (%): (!) 2 %  Filed Weights   05/01/24 1348  Weight: 85 kg     Intake/Output Summary (Last 24 hours) at 05/04/2024 1233 Last data filed at 05/04/2024 0653 Gross per 24 hour  Intake 2203.41 ml  Output 900 ml  Net 1303.41 ml   Net IO Since Admission: -87.94 mL [05/04/24 1233]  Physical Exam: Physical Exam Vitals reviewed.  Constitutional:      Appearance: He is not ill-appearing.  HENT:     Nose:     Comments: [place] in Cardiovascular:     Pulses: Normal pulses.  Pulmonary:     Effort: Pulmonary effort is normal.  Abdominal:     Comments: Ostomy bad in place with small amount of solid brown  stool  Musculoskeletal:        General: No tenderness.     Comments: R AKA without swelling or erythema. Normal appearing scar although tender to light touch L leg erythematous with hyperkeratotic scale and transudate, improved from yesterday per Dr. Kandis L foot with two toe amputations   Neurological:     Mental Status: He is alert.  Psychiatric:        Mood and Affect: Mood normal.     Patient Lines/Drains/Airways Status     Active Line/Drains/Airways     Name Placement date Placement time Site Days   Peripheral IV 05/01/24 Right Antecubital 05/01/24  1312  Antecubital  1   Peripheral IV 05/01/24 20 G 2.5 Left;Anterior Forearm 05/01/24  1450  Forearm  1   Ileostomy Continent (Abdominal pouch) RUQ 12/04/22  0001  RUQ  515            Pertinent labs and imaging:  Bladder scan 736 mL this AM Mg 1.8    Latest Ref Rng & Units 05/04/2024    3:08 AM 05/03/2024    7:38 AM 05/02/2024    4:56 PM  CBC  WBC 4.0 - 10.5 K/uL 5.7  8.3  7.9   Hemoglobin 13.0 - 17.0 g/dL  7.2  8.7  9.3   Hematocrit 39.0 - 52.0 % 22.3  28.0  29.1   Platelets 150 - 400 K/uL 130  147  141        Latest Ref Rng & Units 05/04/2024    3:12 AM 05/04/2024    3:08 AM 05/03/2024    7:38 AM  CMP  Glucose 70 - 99 mg/dL  88  91   BUN 8 - 23 mg/dL  49  44   Creatinine 9.38 - 1.24 mg/dL  6.67  6.85   Sodium 864 - 145 mmol/L  136  139   Potassium 3.5 - 5.1 mmol/L  3.5  3.8   Chloride 98 - 111 mmol/L  106  106   CO2 22 - 32 mmol/L  22  17   Calcium  8.9 - 10.3 mg/dL  7.9  8.2   Total Protein 6.5 - 8.1 g/dL 5.1     Total Bilirubin 0.0 - 1.2 mg/dL 0.9     Alkaline Phos 38 - 126 U/L 67     AST 15 - 41 U/L 41     ALT 0 - 44 U/L 17       VAS US  LOWER EXTREMITY VENOUS (DVT) Result Date: 05/04/2024  Lower Venous DVT Study Patient Name:  Gregory [name]  Date of Exam:   05/03/2024 Medical Rec #: [id number]     Accession #:    [id number] Date of Birth: 03-22-1961    Patient Gender: M Patient Age:   59 years  Exam Location:  [facility] Procedure:      VAS US  LOWER EXTREMITY VENOUS (DVT) Referring Phys: JEFFREY HATCHER --------------------------------------------------------------------------------  Indications: Pain, and Swelling (Chronic)  Risk Factors: Right AKA. Comparison Study: Patient has had multiple negative left lower extremity venous                   studies since 2020. The most recent negative left LEV done                   09/19/22, and 08/23/22. Performing Technologist: Alberta Lis RVS  Examination Guidelines: A complete evaluation includes B-mode imaging, spectral Doppler, color Doppler, and power Doppler as needed of all accessible portions of each vessel. Bilateral testing is considered an integral part of a complete examination. Limited examinations for reoccurring indications may be performed as noted. The reflux portion of the exam is performed with the patient in reverse Trendelenburg.  +---------+---------------+---------+-----------+----------+---------------+ RIGHT    CompressibilityPhasicitySpontaneityPropertiesThrombus Aging  +---------+---------------+---------+-----------+----------+---------------+ CFV      Full           Yes      No                                   +---------+---------------+---------+-----------+----------+---------------+ SFJ      Full                                                         +---------+---------------+---------+-----------+----------+---------------+ FV Prox  Full                                                         +---------+---------------+---------+-----------+----------+---------------+  FV Mid                                                patent by color +---------+---------------+---------+-----------+----------+---------------+ FV Distal                                             patent by color +---------+---------------+---------+-----------+----------+---------------+ PFV      Full                                                          +---------+---------------+---------+-----------+----------+---------------+   +--------+---------------+---------+-----------+----------+--------------------+ LEFT    CompressibilityPhasicitySpontaneityPropertiesThrombus Aging       +--------+---------------+---------+-----------+----------+--------------------+ CFV     Full           Yes      No                                        +--------+---------------+---------+-----------+----------+--------------------+ SFJ     Full                                                              +--------+---------------+---------+-----------+----------+--------------------+ FV Prox Full           Yes      No                                        +--------+---------------+---------+-----------+----------+--------------------+ FV Mid                 Yes      No                   patent by color                                                           Doppler              +--------+---------------+---------+-----------+----------+--------------------+ FV                                                   patent by color      Distal  Doppler              +--------+---------------+---------+-----------+----------+--------------------+ PFV     Full                                                              +--------+---------------+---------+-----------+----------+--------------------+ POP     Full           Yes      No                                        +--------+---------------+---------+-----------+----------+--------------------+ PTV     Full                                                              +--------+---------------+---------+-----------+----------+--------------------+ PERO    Full                                                               +--------+---------------+---------+-----------+----------+--------------------+     Summary: RIGHT: - No evidence of common femoral vein obstruction.   LEFT: - There is no evidence of deep vein thrombosis in the lower extremity.  - No cystic structure found in the popliteal fossa.  *See table(s) above for measurements and observations. Electronically signed by Debby Robertson on 05/04/2024 at 8:09:49 AM.    Final      ASSESSMENT/PLAN:  Assessment: Principal Problem:   Sepsis (HCC) Active Problems:   AKI (acute kidney injury)   Hypertensive heart disease with congestive heart failure (HCC)   S/P AKA (above knee amputation) unilateral, right (HCC)   CKD (chronic kidney disease) stage 3, GFR 30-59 ml/min (HCC)   Ileostomy status (HCC)   Increased anion gap metabolic acidosis   QT prolongation   Hemoglobinuria   Elevated CK  Plan: Gregory [name] is a 63 y.o. person living with a history of hypertension, hyperlipidemia, type 2 diabetes on insulin , cervical spinal stenosis, ileostomy, COPD, right AKA, chronic pain syndrome on multiple narcotics, history of opioid OD, stroke, admission in 12/2023 for GBS bacteremia and encephalopathy. He presented with confusion from [facility] and admitted for sepsis on hospital day 1   #Sepsis likely 2/2 L leg Cellulitis  Temperature fine overnight, WBC improved to 5.7 from 13.3 on 11/14.  Urine and Blood cultures negative to date.  9/10: CT of L foot was concerning for active erosion and osteomyelitis. Patient refused MRI and surgical intervention, was treated with 7 days IV Zosyn  and Zyvox , discharged on PO Augmentin . 11/14: CT foot does not have evidence of osteomyelitis. CT tibia fibula 11/14 w/o evidence of osteomyelitis or septic arthritis and consistent with cellulitis. Consulted ortho surg, appreciate recs. Continue broad spec abx with Vanc per pharm and cefepime .  - DC Zosyn  and Vancomycin   - Start Cefazolin  - Monitoring Urine and Blood  cultures -  PT/OT eval -AM CBC   # Pain Management # Agitation Pt has been in pain which has led to agitation. He has no desire to get out of bed because of the fall risk, though he has been yelling and was restrained earlier during this admission. BP has been low which is complicating pain management.  Pain Regimen  - Tylenol  1000mg  Q6H scheduled  - Lyrica  25mg  PO daily  - Robaxin  500mg  PO PRN Q6H  *Oxycodone  Held for low BP Agitation (use below PRNs only after pain PRNs have been utilized)  - Seroquel  50mg  BID scheduled  - Home methadone  15mg  PO Q8H scheduled  - Buspirone  15mg  PO TID scheduled  - Hydroxyzine  25mg  PO TID PRN for moderate agitation  - Clonazepam  1 mg TID PRN for anxiety  *Consider Olanzapine  10mg  PO if the above is insufficient, order not placed.   #AKI on CKD3 Cr 3.32 from 3.72 on 11/14. Likely multifactorial in the setting of known CKD3 and BPH as well as acute sepsis. AG 16. CK 2409.  -RFP Mg AM -NaHCO3 650 mg BID -atorvastatin  80 mg daily - Fluid - AM BMP  #Normocytic Anemia  On admission, Hgb 13.5, 8.7 today. It appears his baseline is around 9-10, likely in the setting of CKD3. Ostomy bag not concerning for red or black stools, nor was his stool that was spread over the ED room suspicious for a GI bleed. Moderate Hgb on urine dipstick although I suspect at least some component of this is due to rentention (scan this AM with 760 mL). Potentially hemoconcentration? Continue to monitor for signs of bleeding, will repeat CBC tonight and check ferritin and iron panel. Iron Saturation 42% -Monitor CBC   # BPH # Hemoglobinuria  # Elevated CK Of note, his urine is a green-ish color likely 2/2 to home UroMP. Bladder scan 736 mL, ordered foley catheter. Continue to monitor output. CK 1687 from 2409, suspected from immobility and dehydration.  - Tamsulosin  0.4 mg daily *Stopped Lipitor   #Acute Encephalopathic, resolved Likely due to sepsis and resulting metabolic  derangements, also on several sedating home medications. Agitation improved in ED with droperidol , haloperidol  and midazolam  -quetiapine  25 mg BID   T2DM On a sliding scale at home. A1c 5.9 two months ago. FBG 84 this morning, was NPO overnight. Blood sugars have been good. - Stopping frequent BG checks   #Congestive HF On admission, BNP 108.8. BP 121/60 and has been stable since arrival. CXR c/w normal heart size and neg for pulm edema. Does not appear volume overloaded, although his leg is concerning for venous stasis with superimposed cellutitis, less suspicious for heart failure exacerbation. Will continue to monitor and hold off on lasix  for now. -hold home Lasix  due to Cr -RFP Mg AM   #Ileostomy On and functioning well.  -wound ostomy consulted  -Dicyclomine 20 mg  -lactulose   - Floor staff to inform us  if he requires 3+ changes per shift.   #COPD RVP neg. Has been alternating between [place] at 2L and room air, effort normal. Low concern for exacerbation at this time.  -albuterol  -fluticasone     Qtc Prolongation  612 ms on admission, on 11/16. Likely partially from home methadone .  - Avoid QTC prolonging medications - Continuous cardiac monitor    Hx of stroke -Continue ASA 81 mg *Holding Lipitor  for high CK   Hx of seizures 11/2023 - Keppra  500 mg BID - Oxcarbazepine  150 mg BID   Best Practice: Diet: heart healthy IVF: Fluids: none,  Rate: None VTE: heparin  injection 5,000 Units Start: 05/01/24 1700 Code: Full  Disposition planning: Therapy Recs: Pending, DME: none Family Contact: Daughter, to be notified. DISPO: Anticipated discharge 1-2 days to Skilled nursing facility pending IV antibiotics.  Signature:  Penne Mori; DO - PGY1 [facility]  12:33 PM, 05/04/2024  On Call pager [phone]

## 2024-05-04 NOTE — Progress Notes (Signed)
 IMTS Cross Cover Note  Received page from nursing about low MAP to 57 for the patient around 0130. Ordered 1L LR bolus with no improvement of BP to 53. Went to bedside to evaluate patient. The patient was drowsy but able to follow commands. He was endorsing some pain in his abdomen.   Exam: Vitals:   05/04/24 0250 05/04/24 0300  BP: (!) 85/39 (!) 82/46  Pulse: 63 60  Resp:    Temp:  98.9 F (37.2 C)  SpO2: 100% 95%    General: Lying in bed with eyes closed. Follows commands Cardio: RRR. No m/R/G Pulm: CTAB on the lateral lung bases Abdominal: Ostomy in place draining brownish stool. Abdominal tenderness on physical exam, poorly localized Extremities: Erythema, swelling, and warmth of the LLE. No peripheral edema Neuro: Follow commands but does not open eyes to voice.  CBC without worsening leukocytosis.   Unclear cause of hypotension at this point. Possible due to evolving sepsis vs poor fluid intake. Has received 1L bolus overnight. Unlikely to be volume overloaded at this point, as his lungs are clear and he does not have peripheral edema. He does have fluid loss through his stool.  Plan: -Lactic acid now to evaluate for hypoperfusion -Additional 1L LR bolus -Monitor BP -CMP to evaluate for electrolyte derangement -Trend CK -VBG to evaluate for hypercarbia, as patient is lethargic -BNP to evaluate trend and rule out volume overload.   Melvenia Morrison Internal Medicine Resident PGY-1 Please contact the on-call pager after 5 pm and on weekends at [phone]

## 2024-05-04 NOTE — Progress Notes (Signed)
 Nutrition Follow-up  DOCUMENTATION CODES:  Not applicable  INTERVENTION:  Multivitamin w/ minerals daily Discontinue Ensure Plus High Protein po BID per pt preference e Add Magic cup BID with meals, each supplement provides 290 kcal and 9 grams of protein Continue Regular diet  Meal ordering with assistance  Encourage good PO intake   NUTRITION DIAGNOSIS:  Increased nutrient needs related to chronic illness, wound healing as evidenced by estimated needs. - continues   GOAL:  Patient will meet greater than or equal to 90% of their needs - progressing   MONITOR:  PO intake, Supplement acceptance, Weight trends, Labs  REASON FOR ASSESSMENT:  Consult Assessment of nutrition requirement/status  ASSESSMENT:  63 y.o. male presented to the ED from [facility] with confusion. PMH includes COPD, CHF, R AKA, GERD, T2DM, HTN, HLD, CKD IIIb, BPH, CVA, malnutrition, and s/p ileostomy. Pt admitted with sepsis likely from L foot osteomyelitis, AKI on CKD, and acute encephalopathic.   11/14 - Admitted   Patient seen in room. Pt less confused than admission, however still not able to provide detailed history. Pt was very sleepy during visit, hard to keep him awake. Pt reports eating breakfast this morning but unsure of how much he ate. No reports appetite changed PTA, ate 2-3 meals per day at baseline. Unopened ensure at bedside, pt reported that he does not like ensure but was open to trying magic cup with meals. Pt noted to be intermittently agitated likely secondary to pain, ongoing adjustments to both regimens per MD note.   Admission Weight: 85 kg - likely copied forward  Current weight: 89.5 kg  Pt unsure of UBW, could not report on weather he has lost or gained significant weight recently.   Nutrition Related Medications: Lactulose , MVI w/minerals, sodium bicarb  Labs: BUN 49, Cr 3.32, Calcium  7.9, GFR 20     NUTRITION - FOCUSED PHYSICAL EXAM: Flowsheet Row Most Recent Value   Orbital Region No depletion  Upper Arm Region Mild depletion  Thoracic and Lumbar Region Mild depletion  Buccal Region No depletion  Temple Region Moderate depletion  Clavicle Bone Region Mild depletion  Clavicle and Acromion Bone Region Mild depletion  Scapular Bone Region Mild depletion  Dorsal Hand Mild depletion  Patellar Region Unable to assess  [Right AKA, pt c/o pain when trying to assess left leg]  Anterior Thigh Region Unable to assess  [Right AKA, pt c/o pain when trying to assess left leg]  Posterior Calf Region Unable to assess  [Right AKA, pt c/o pain when trying to assess left leg]  Hair Reviewed  Eyes Reviewed  Mouth Reviewed  Skin Reviewed  Nails Reviewed     Diet Order:   Diet Order             Diet regular Room service appropriate? Yes with Assist; Fluid consistency: Thin  Diet effective now                  EDUCATION NEEDS:  No education needs have been identified at this time  Skin:  Skin Assessment: Skin Integrity Issues: Skin Integrity Issues:: DTI DTI: Sacrum; R buttocks  Last BM:  11/16, ostomy  Height:  Ht Readings from Last 1 Encounters:  05/01/24 6' (1.829 m)   Weight:  Wt Readings from Last 1 Encounters:  05/04/24 89.5 kg   Ideal Body Weight:  80.9 kg  BMI:  Body mass index is 26.76 kg/m. Adjusted BMI for AKA: 27.9 kg/m2  Estimated Nutritional Needs:  Kcal:  2100-2300 Protein:  100-120 grams Fluid:  >/= 2 L  Kamron Vanwyhe, MS, RD, LDN Clinical Dietitian  Contact via secure chat. If unavailable, use group chat RD Inpatient.'

## 2024-05-04 NOTE — Plan of Care (Signed)
  Problem: Elimination: Goal: Will not experience complications related to bowel motility Outcome: Progressing   Problem: Safety: Goal: Ability to remain free from injury will improve Outcome: Progressing   Problem: Pain Managment: Goal: General experience of comfort will improve and/or be controlled Outcome: Progressing

## 2024-05-05 ENCOUNTER — Other Ambulatory Visit (HOSPITAL_COMMUNITY): Payer: Self-pay

## 2024-05-05 DIAGNOSIS — A419 Sepsis, unspecified organism: Secondary | ICD-10-CM | POA: Diagnosis not present

## 2024-05-05 DIAGNOSIS — R6520 Severe sepsis without septic shock: Secondary | ICD-10-CM | POA: Diagnosis not present

## 2024-05-05 DIAGNOSIS — E11628 Type 2 diabetes mellitus with other skin complications: Secondary | ICD-10-CM | POA: Diagnosis not present

## 2024-05-05 DIAGNOSIS — L03116 Cellulitis of left lower limb: Secondary | ICD-10-CM | POA: Diagnosis not present

## 2024-05-05 LAB — RENAL FUNCTION PANEL
Albumin: 1.8 g/dL — ABNORMAL LOW (ref 3.5–5.0)
Anion gap: 8 (ref 5–15)
BUN: 44 mg/dL — ABNORMAL HIGH (ref 8–23)
CO2: 23 mmol/L (ref 22–32)
Calcium: 8.4 mg/dL — ABNORMAL LOW (ref 8.9–10.3)
Chloride: 110 mmol/L (ref 98–111)
Creatinine, Ser: 2.86 mg/dL — ABNORMAL HIGH (ref 0.61–1.24)
GFR, Estimated: 24 mL/min — ABNORMAL LOW (ref >60)
Glucose, Bld: 62 mg/dL — ABNORMAL LOW (ref 70–99)
Phosphorus: 4.8 mg/dL — ABNORMAL HIGH (ref 2.5–4.6)
Potassium: 3.6 mmol/L (ref 3.5–5.1)
Sodium: 141 mmol/L (ref 135–145)

## 2024-05-05 LAB — CBC
HCT: 25 % — ABNORMAL LOW (ref 39.0–52.0)
Hemoglobin: 7.9 g/dL — ABNORMAL LOW (ref 13.0–17.0)
MCH: 30.5 pg (ref 26.0–34.0)
MCHC: 31.6 g/dL (ref 30.0–36.0)
MCV: 96.5 fL (ref 80.0–100.0)
Platelets: 151 K/uL (ref 150–400)
RBC: 2.59 MIL/uL — ABNORMAL LOW (ref 4.22–5.81)
RDW: 14.2 % (ref 11.5–15.5)
WBC: 5.8 K/uL (ref 4.0–10.5)
nRBC: 0 % (ref 0.0–0.2)

## 2024-05-05 LAB — MRSA NEXT GEN BY PCR, NASAL: MRSA by PCR Next Gen: NOT DETECTED

## 2024-05-05 LAB — MAGNESIUM: Magnesium: 2 mg/dL (ref 1.7–2.4)

## 2024-05-05 LAB — GLUCOSE, CAPILLARY: Glucose-Capillary: 97 mg/dL (ref 70–99)

## 2024-05-05 MED ORDER — POTASSIUM CHLORIDE CRYS ER 20 MEQ PO TBCR
40.0000 meq | EXTENDED_RELEASE_TABLET | Freq: Once | ORAL | Status: AC
  Administered 2024-05-05: 40 meq via ORAL
  Filled 2024-05-05: qty 2

## 2024-05-05 MED ORDER — ORAL CARE MOUTH RINSE: 15.0000 mL | OROMUCOSAL | Status: DC | PRN

## 2024-05-05 MED ORDER — CEFADROXIL 500 MG PO CAPS
500.0000 mg | ORAL_CAPSULE | Freq: Two times a day (BID) | ORAL | Status: DC
  Administered 2024-05-05: 500 mg via ORAL
  Filled 2024-05-05 (×2): qty 1

## 2024-05-05 MED ORDER — HYDROXYZINE HCL 25 MG PO TABS
25.0000 mg | ORAL_TABLET | Freq: Three times a day (TID) | ORAL | 0 refills | Status: AC | PRN
  Filled 2024-05-05: qty 30, 10d supply, fill #0

## 2024-05-05 MED ORDER — FERROUS SULFATE 325 (65 FE) MG PO TABS
325.0000 mg | ORAL_TABLET | Freq: Every day | ORAL | 0 refills | Status: AC
  Filled 2024-05-05: qty 90, 90d supply, fill #0

## 2024-05-05 MED ORDER — PREGABALIN 25 MG PO CAPS: 25.0000 mg | ORAL_CAPSULE | Freq: Every day | ORAL | Status: AC

## 2024-05-05 MED ORDER — CEFADROXIL 500 MG PO CAPS
500.0000 mg | ORAL_CAPSULE | Freq: Two times a day (BID) | ORAL | 0 refills | Status: AC
  Filled 2024-05-05: qty 6, 3d supply, fill #0

## 2024-05-05 NOTE — Discharge Summary (Signed)
 Name: Gregory [name] MRN: [id number] DOB: 22-Jul-1960 63 y.o. PCP: [name], Provider, MD  Date of Admission: 05/01/2024 12:42 PM Date of Discharge: 05/05/2024 Attending Physician: Dr. Reyes Fenton  Discharge Diagnosis: 1. Principal Problem:   Sepsis (HCC) Active Problems:   AKI (acute kidney injury)   Hypertensive heart disease with congestive heart failure (HCC)   S/P AKA (above knee amputation) unilateral, right (HCC)   CKD (chronic kidney disease) stage 3, GFR 30-59 ml/min (HCC)   Ileostomy status (HCC)   Increased anion gap metabolic acidosis   QT prolongation   Hemoglobinuria   Elevated CK  Discharge Medications: Allergies as of 05/05/2024       Reactions   Latex Other (See Comments)   Allergic, per [facility]        Medication List     PAUSE taking these medications    atorvastatin  80 MG tablet Wait to take this until your doctor or other care provider tells you to start again. Lipitor  was held for CK levels over 2000 Commonly known as: LIPITOR  Take 1 tablet (80 mg total) by mouth daily. What changed: when to take this   doxepin  25 MG capsule Wait to take this until your doctor or other care provider tells you to start again. Commonly known as: SINEQUAN  Take 50 mg by mouth at bedtime.   furosemide  20 MG tablet Wait to take this until your doctor or other care provider tells you to start again. Commonly known as: LASIX  Take 20 mg by mouth daily.   ibuprofen  600 MG tablet Wait to take this until your doctor or other care provider tells you to start again. Commonly known as: ADVIL  Take 600 mg by mouth every 6 (six) hours as needed for mild pain (pain score 1-3) or moderate pain (pain score 4-6).   ondansetron  8 MG tablet Wait to take this until your doctor or other care provider tells you to start again. Commonly known as: ZOFRAN  Take 8 mg by mouth every 6 (six) hours as needed for nausea or vomiting.   oxyCODONE  5 MG immediate release tablet Wait to  take this until your doctor or other care provider tells you to start again. Commonly known as: Oxy IR/ROXICODONE  Take 5 mg by mouth every 6 (six) hours as needed for severe pain (pain score 7-10) or moderate pain (pain score 4-6).       STOP taking these medications    Journavx  50 MG Tabs Generic drug: Suzetrigine    Melatonin 10 MG Tabs   prochlorperazine  5 MG tablet Commonly known as: COMPAZINE        TAKE these medications    acetaminophen  500 MG tablet Commonly known as: TYLENOL  Take 1,000 mg by mouth every 8 (eight) hours.   albuterol  (2.5 MG/3ML) 0.083% nebulizer solution Commonly known as: PROVENTIL  Take 2.5 mg by nebulization every 6 (six) hours as needed for wheezing or shortness of breath.   albuterol  108 (90 Base) MCG/ACT inhaler Commonly known as: VENTOLIN  HFA Inhale 2 puffs into the lungs every 6 (six) hours as needed for wheezing or shortness of breath.   aspirin  EC 81 MG tablet Take 81 mg by mouth daily.   bismuth subsalicylate 262 MG/15ML suspension Commonly known as: PEPTO BISMOL Take 30 mLs by mouth every 12 (twelve) hours as needed for diarrhea or loose stools.   busPIRone  15 MG tablet Commonly known as: BUSPAR  Take 15 mg by mouth 3 (three) times daily.   cefadroxil  500 MG capsule Commonly known as: DURICEF Take  1 capsule (500 mg total) by mouth 2 (two) times daily for 3 days.   Cholecalciferol  25 MCG (1000 UT) capsule Take 2,000 Units by mouth daily.   clonazePAM  0.5 MG tablet Commonly known as: KLONOPIN  Take 0.5 mg by mouth 3 (three) times daily as needed for anxiety.   dicyclomine 20 MG tablet Commonly known as: BENTYL Take 1 tablet (20 mg total) by mouth 2 (two) times daily.   docusate sodium  100 MG capsule Commonly known as: COLACE Take 100 mg by mouth every 12 (twelve) hours.   ferrous sulfate  325 (65 FE) MG tablet Take 1 tablet (325 mg total) by mouth daily with breakfast. What changed: when to take this   fluticasone  50  MCG/ACT nasal spray Commonly known as: FLONASE  Place 1 spray into both nostrils daily.   hydrOXYzine  25 MG tablet Commonly known as: ATARAX  Take 1 tablet (25 mg total) by mouth 3 (three) times daily as needed for anxiety (moderate agitation). What changed:  when to take this reasons to take this   insulin  lispro 100 UNIT/ML KwikPen Commonly known as: HUMALOG  Inject 0-10 Units into the skin See admin instructions. Inject 0-10 units into the skin three times a day and at bedtime, PER SLIDING SCALE:  BGL 151-200 = 2 units 201-250 = 4 units 251-300 = 6 units 301-350 =  8 units 351-400 = 10 units   lactulose  10 GM/15ML solution Commonly known as: CHRONULAC  Take 30 mLs (20 g total) by mouth 2 (two) times daily.   levETIRAcetam  500 MG tablet Commonly known as: Keppra  Take 1 tablet (500 mg total) by mouth 2 (two) times daily.   lidocaine  4 % Place 1 patch onto the skin daily. Apply 1 new patch to the affected area and remove 12 hours later   loperamide  2 MG capsule Commonly known as: IMODIUM  Take 1 capsule (2 mg total) by mouth 4 (four) times daily as needed for diarrhea or loose stools.   loratadine  10 MG tablet Commonly known as: CLARITIN  Take 10 mg by mouth daily.   magnesium  oxide 400 (240 Mg) MG tablet Commonly known as: MAG-OX Take 400 mg by mouth 3 (three) times daily.   methadone  5 MG tablet Commonly known as: DOLOPHINE  Take 5 mg by mouth in the morning, at noon, and at bedtime. Take 1 tablet by mouth with a 10mg  Methadone  for a combined dose of 15mg  Methadone  3 times daily.   methadone  10 MG tablet Commonly known as: DOLOPHINE  Take 10 mg by mouth in the morning, at noon, and at bedtime. Take 1 tablet by mouth with a 5mg  Methadone  for a combined dose of 15mg  Methadone  3 times daily.   methocarbamol  500 MG tablet Commonly known as: ROBAXIN  Take 500 mg by mouth every 6 (six) hours as needed for muscle spasms.   Narcan  4 MG/0.1ML Liqd nasal spray kit Generic drug:  naloxone  Place 1 spray into the nose See admin instructions. Instill 1 spray into alternating nostrils every 5 minutes as needed for an opiate overdose   naloxone  0.4 MG/ML injection Commonly known as: NARCAN  Inject 0.4 mg into the muscle as needed (for oversedation).   OXcarbazepine  150 MG tablet Commonly known as: TRILEPTAL  Take 1 tablet (150 mg total) by mouth 2 (two) times daily.   pregabalin  25 MG capsule Commonly known as: Lyrica  Take 1 capsule (25 mg total) by mouth daily. What changed:  medication strength how much to take when to take this   QUEtiapine  25 MG tablet Commonly known as:  SEROQUEL  Take 1 tablet (25 mg total) by mouth at bedtime and may repeat dose one time if needed. What changed: when to take this   sodium bicarbonate  650 MG tablet Take 650 mg by mouth 2 (two) times daily.   sodium chloride  0.65 % Soln nasal spray Commonly known as: OCEAN Place 1 spray into both nostrils every 4 (four) hours as needed for congestion.   tamsulosin  0.4 MG Caps capsule Commonly known as: FLOMAX  Take 1 capsule (0.4 mg total) by mouth daily. What changed: when to take this   Uro-MP 118 MG Caps Take 118 mg by mouth 2 (two) times daily.               Discharge Care Instructions  (From admission, onward)           Start     Ordered   05/05/24 0000  Discharge wound care:       Comments: Cleanse sacrum/buttocks with Vashe wound cleanser, do not rinse and allow to air dry.  Apply Xeroform gauze to areas of purple discoloration daily and secure with ABD pad. Would avoid silicone foam if possible as this will hold moisture onto skin.   05/05/24 1015            Disposition and follow-up:   Mr.Sovereign L Churilla was discharged from [facility] in Stable condition.  At the hospital follow up visit please address:  1.  Please ensure he takes the rest of his antibiotics which are scheduled through 05/07/2024  2.  Please review the multiple  medications which were held for Qtc prolongation. Not all Qtc prolonging medications were held, but many were because his Qtc was >600 on admission.  Follow-up Appointments:  Continue with your previously established care providers.  Hospital Course by problem list: TRENDON ZARING is a 63 y.o. person living with a history of HTN, COPD, DM2 (since 63 yo), CHF, previous CVA, previous R BKA, prev GBS sepsis with osteomyelitis 12-2023  who presented with Sepsis from L leg cellulitis and admitted for IV antibiotic management now being discharged on hospital day 4 with the following pertinent hospital course:  Sepsis likely 2/2 L leg Cellulitis   Pt was admitted for sepsis from L leg cellulitis which responded well to Zosyn  and Vancomycin . Sepsis quickly resolved and pt was deescalated to Ancef , and then Cefadroxil  for PO antibiotic treatment. He was then able to be transferred to SNF.   Acute Encephalopathic Pt was confused for much of 11/16, thought to be from delirium from infection. Fluids were maintained, BP was managed, and pt was back to baseline on 11/17 and did not have another encephalopathic event.   AKI on CKD3 Azotemia [name] Cr was 3.72 on admission from a presumed baseline of 1.73 one month prior. Improvement back to 2.86.  Normocytic Anemia  Pt Hgb on admission as 13.5, which was suspected to be heme concentrated, and stabilized around 8.0. There were no clear signs of bleeding, and he was monitored throughout his stay.  T2DM On a sliding scale at home. A1c 5.9 two months ago.  -continue sliding scale    Congestive HF Pt has a history of Congestive HF, but he appeared volume down so we held his Lasix  and repleted his fluids. He responded well to this and did not** have any HF exacerbation while admitted.    Ileostomy Patient was admitted with an ileostomy which was managed in the hospital without concern.    COPD Patient has  history of COPD, there were no concerns with it during  his hospitalization.   Qtc Prolongation  Patient had a  QTcB of 612 ms on admission. He is on multiple medications that prolong his QTc, and multiple medication adjustments were made to help lower this. Would recommend careful reconsideration of medications prior to restarting them at home.   Subjective On day of discharge, patient was alert and oriented and reports that his pain has not gotten worse. He denies fever, SOB, chest pain, diarrhea, and abdominal pain. He says he would feel comfortable to go home. All questions were answered.  Discharge Exam:   BP (!) 104/58 (BP Location: Right Arm)   Pulse (!) 59   Temp 98 F (36.7 C)   Resp 18   Ht 6' (1.829 m)   Wt 89.5 kg   SpO2 97%   BMI 26.76 kg/m   Physical Exam Vitals and nursing note reviewed. Exam conducted with a chaperone present.  Constitutional:      General: He is not in acute distress. Cardiovascular:     Rate and Rhythm: Normal rate and regular rhythm.     Pulses: Normal pulses.     Heart sounds: No murmur heard. Pulmonary:     Effort: Pulmonary effort is normal. No respiratory distress.     Breath sounds: Wheezing present.  Abdominal:     General: There is no distension.     Palpations: Abdomen is soft.     Tenderness: There is no abdominal tenderness.     Comments: Ostomy bag  Musculoskeletal:     Right lower leg: No edema.     Left lower leg: No edema.     Comments: R AKA without swelling or erythema. L leg erythematous with hyperkeratotic scale, improved from yesterday L foot with two toe amputations  Skin:    General: Skin is warm and dry.  Neurological:     Mental Status: He is alert and oriented to person, place, and time.     Pertinent Labs, Studies, and Procedures:     Latest Ref Rng & Units 05/05/2024    5:34 AM 05/04/2024    3:08 AM 05/03/2024    7:38 AM  CBC  WBC 4.0 - 10.5 K/uL 5.8  5.7  8.3   Hemoglobin 13.0 - 17.0 g/dL 7.9  7.2  8.7   Hematocrit 39.0 - 52.0 % 25.0  22.3  28.0    Platelets 150 - 400 K/uL 151  130  147        Latest Ref Rng & Units 05/05/2024    5:34 AM 05/04/2024    3:12 AM 05/04/2024    3:08 AM  CMP  Glucose 70 - 99 mg/dL 62   88   BUN 8 - 23 mg/dL 44   49   Creatinine 9.38 - 1.24 mg/dL 7.13   6.67   Sodium 864 - 145 mmol/L 141   136   Potassium 3.5 - 5.1 mmol/L 3.6   3.5   Chloride 98 - 111 mmol/L 110   106   CO2 22 - 32 mmol/L 23   22   Calcium  8.9 - 10.3 mg/dL 8.4   7.9   Total Protein 6.5 - 8.1 g/dL  5.1    Total Bilirubin 0.0 - 1.2 mg/dL  0.9    Alkaline Phos 38 - 126 U/L  67    AST 15 - 41 U/L  41    ALT 0 - 44 U/L  17  CT FOOT LEFT WO CONTRAST Result Date: 05/02/2024 CLINICAL DATA:  Foot swelling, diabetic, osteomyelitis suspected, xray done EXAM: CT OF THE LEFT FOOT WITHOUT CONTRAST TECHNIQUE: Multidetector CT imaging of the left foot was performed according to the standard protocol. Multiplanar CT image reconstructions were also generated. RADIATION DOSE REDUCTION: This exam was performed according to the departmental dose-optimization program which includes automated exposure control, adjustment of the mA and/or kV according to patient size and/or use of iterative reconstruction technique. COMPARISON:  Radiographs 05/01/2024 and 02/23/2024.  CT 02/26/2024. FINDINGS: Bones/Joint/Cartilage Left lower leg findings dictated separately. The planes of the reformatted images are different between the studies, limiting comparison. Grossly stable postsurgical changes from previous amputation through the distal shaft of the 1st and 2nd metatarsals and through the distal metaphysis of the 3rd proximal phalanx. Stable chronic erosions of the fused 5th middle and distal phalanges. No new areas of bone destruction identified. The bones are diffusely demineralized. Stable heterogeneous sclerosis within the medial and middle cuneiform bones. Ligaments Suboptimally assessed by CT. Muscles and Tendons Severe generalized muscular atrophy again noted.  The ankle tendons appear intact. There are stable postsurgical changes related to the forefoot amputations. Soft tissues Generalized subcutaneous edema throughout the ankle and dorsal aspect of the foot has progressed compared with the previous CT. No focal fluid collection, foreign body or soft tissue emphysema demonstrated. There are scattered vascular calcifications. IMPRESSION: 1. No CT evidence of osteomyelitis. 2. Stable postsurgical changes from previous amputations as described. 3. Progressive generalized subcutaneous edema throughout the ankle and dorsal aspect of the foot, nonspecific. No focal fluid collection, foreign body or soft tissue emphysema demonstrated. 4. Severe generalized muscular atrophy. Electronically Signed   By: Elsie Perone M.D.   On: 05/02/2024 10:29   CT TIBIA FIBULA LEFT WO CONTRAST Result Date: 05/02/2024 CLINICAL DATA:  Osteomyelitis suspected, tib/fib, xray done Soft tissue findings suspicious for cellulitis previous CT. EXAM: CT OF THE LOWER LEFT EXTREMITY WITHOUT CONTRAST TECHNIQUE: Multidetector CT imaging of the left lower leg was performed according to the standard protocol. RADIATION DOSE REDUCTION: This exam was performed according to the departmental dose-optimization program which includes automated exposure control, adjustment of the mA and/or kV according to patient size and/or use of iterative reconstruction technique. COMPARISON:  Prior CT 02/26/2024.  Radiographs 05/01/2024. FINDINGS: Left foot findings dictated separately. Bones/Joint/Cartilage The bones appear demineralized. No evidence of acute fracture, dislocation or osteomyelitis. There are medial compartment degenerative changes at the knee without significant joint effusion. No significant arthropathy or effusion demonstrated at the left ankle. Ligaments Suboptimally assessed by CT. Muscles and Tendons Severe generalized fatty atrophy again noted throughout the left lower leg musculature. There is also  involvement of the distal hamstring musculature. The visualized quadriceps and patellar tendons are intact. The ankle tendons appear intact, without significant tenosynovitis. Soft tissues Circumferential subcutaneous edema throughout the lower leg has progressed with associated dermal thickening and mild cutaneous irregularity, as before. No organized fluid collection, foreign body or soft tissue emphysema identified. There are diffuse vascular calcifications. IMPRESSION: 1. No evidence of osteomyelitis or septic arthritis in the left lower leg. 2. Progressive circumferential subcutaneous edema throughout the lower leg with associated dermal thickening and mild cutaneous irregularity, consistent with cellulitis. Differential includes lymphedema, heart failure and venous insufficiency. No organized fluid collection, foreign body or soft tissue emphysema identified. 3. Severe generalized fatty atrophy of the left lower leg musculature. 4. Left foot findings dictated separately. Electronically Signed   By: Elsie Perone HERO.D.  On: 05/02/2024 10:19   DG Tibia/Fibula Left Result Date: 05/01/2024 EXAM: 2 VIEW(S) Xray of the Left Tibia and Fibula 05/01/2024 01:56:00 PM COMPARISON: None available. CLINICAL HISTORY: Known osteo Known osteo FINDINGS: BONES AND JOINTS: No acute fracture. No focal osseous lesion. No joint dislocation. SOFT TISSUES: The soft tissues are unremarkable. IMPRESSION: 1. No significant abnormality. Electronically signed by: Lynwood Seip MD 05/01/2024 02:27 PM EST RP Workstation: [id number]   DG Foot 2 Views Left Result Date: 05/01/2024 EXAM: 2 VIEW(S) XRAY OF THE LEFT FOOT 05/01/2024 01:56:00 PM COMPARISON: 02/23/2024. Only lateral image was submitted for review. CLINICAL HISTORY: known osteo known osteo FINDINGS: BONES AND JOINTS: Status post amputation of distal portions of first and second metatarsals and phalanges. Osteomyelitis cannot be excluded due to the single limited image. No  acute fracture. No joint dislocation. SOFT TISSUES: Vascular calcifications are noted. Severe dorsal soft tissue swelling is noted most consistent with infection. IMPRESSION: 1. Severe dorsal soft tissue swelling, suspicious for infection. Osteomyelitis cannot be excluded due to single limited lateral view. 2. 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 Electronically signed by: Lynwood Seip MD 05/01/2024 02:26 PM EST RP Workstation: [id number]   DG Chest Port 1 View Result Date: 05/01/2024 CLINICAL DATA:  Sepsis. EXAM: PORTABLE CHEST 1 VIEW COMPARISON:  03/10/2024 FINDINGS: The heart size and mediastinal contours are within normal limits. Low lung volumes with bibasilar atelectasis. There is no evidence of pulmonary edema, consolidation, pneumothorax or pleural fluid. The visualized skeletal structures are unremarkable. IMPRESSION: Low lung volumes with bibasilar atelectasis. Electronically Signed   By: Marcey Moan M.D.   On: 05/01/2024 14:14    Discharge Instructions: Discharge Instructions     Call MD for:  difficulty breathing, headache or visual disturbances   Complete by: As directed    Call MD for:  extreme fatigue   Complete by: As directed    Call MD for:  hives   Complete by: As directed    Call MD for:  persistant dizziness or light-headedness   Complete by: As directed    Call MD for:  persistant nausea and vomiting   Complete by: As directed    Call MD for:  redness, tenderness, or signs of infection (pain, swelling, redness, odor or green/yellow discharge around incision site)   Complete by: As directed    Call MD for:  severe uncontrolled pain   Complete by: As directed    Call MD for:  temperature >100.4   Complete by: As directed    Diet - low sodium heart healthy   Complete by: As directed    Discharge wound care:   Complete by: As directed    Cleanse sacrum/buttocks with Vashe wound cleanser, do not rinse and allow to  air dry.  Apply Xeroform gauze to areas of purple discoloration daily and secure with ABD pad. Would avoid silicone foam if possible as this will hold moisture onto skin.   Increase activity slowly   Complete by: As directed       Signed: Lera Nancyann NOVAK, DO - PGY1 Internal Medicine Residency Service 05/05/2024, 12:12 PM

## 2024-05-05 NOTE — Discharge Instructions (Signed)
 Thank you for allowing us  to be part of your care. You were hospitalized for Sepsis from a cellulitis leg infection. We treated you with multiple antibiotics. We recommend continuing to take the rest of the antibiotics that we have prescribed.   Please take One 500mg  capsule of Cefadroxil  (DURICEF) twice a day (one in the morning, one in the evening) for the next 3 days (last dose should be 11/20 in the evening).  Please call your PCP or our clinic if you have any questions or concerns, we may be able to help and keep you from a long and expensive emergency room wait. Our clinic and after hours phone number is [phone]. The best time to call is Monday through Friday 9 am to 4 pm but there is always someone available 24/7 if you have an emergency. If you need medication refills please notify your pharmacy one week in advance and they will send us  a request.   We are glad you are feeling better,  Gregory [name]; DO - PGY1 Internal Medicine Inpatient Teaching Service at [facility]

## 2024-05-05 NOTE — Plan of Care (Signed)
  Problem: Education: Goal: Knowledge of General Education information will improve Description: Including pain rating scale, medication(s)/side effects and non-pharmacologic comfort measures Outcome: Progressing   Problem: Clinical Measurements: Goal: Ability to maintain clinical measurements within normal limits will improve Outcome: Progressing Goal: Will remain free from infection Outcome: Progressing   Problem: Activity: Goal: Risk for activity intolerance will decrease Outcome: Progressing   Problem: Nutrition: Goal: Adequate nutrition will be maintained Outcome: Progressing   Problem: Elimination: Goal: Will not experience complications related to bowel motility Outcome: Progressing   Problem: Pain Managment: Goal: General experience of comfort will improve and/or be controlled Outcome: Progressing

## 2024-05-05 NOTE — Discharge Summary (Deleted)
 Name: Gregory [name] MRN: [id number] DOB: Sep 25, 1960 63 y.o. PCP: [name], Provider, MD  Date of Admission: 05/01/2024 12:42 PM Date of Discharge: 05/05/2024 Attending Physician: Dr. Reyes Fenton  Discharge Diagnosis: 1. Principal Problem:   Sepsis (HCC) Active Problems:   AKI (acute kidney injury)   Hypertensive heart disease with congestive heart failure (HCC)   S/P AKA (above knee amputation) unilateral, right (HCC)   CKD (chronic kidney disease) stage 3, GFR 30-59 ml/min (HCC)   Ileostomy status (HCC)   Increased anion gap metabolic acidosis   QT prolongation   Hemoglobinuria   Elevated CK  Discharge Medications: Allergies as of 05/05/2024       Reactions   Latex Other (See Comments)   Allergic, per [facility]        Medication List     PAUSE taking these medications    atorvastatin  80 MG tablet Wait to take this until your doctor or other care provider tells you to start again. Lipitor  was held for CK levels over 2000 Commonly known as: LIPITOR  Take 1 tablet (80 mg total) by mouth daily. What changed: when to take this   doxepin  25 MG capsule Wait to take this until your doctor or other care provider tells you to start again. Commonly known as: SINEQUAN  Take 50 mg by mouth at bedtime.   furosemide  20 MG tablet Wait to take this until your doctor or other care provider tells you to start again. Commonly known as: LASIX  Take 20 mg by mouth daily.   ibuprofen  600 MG tablet Wait to take this until your doctor or other care provider tells you to start again. Commonly known as: ADVIL  Take 600 mg by mouth every 6 (six) hours as needed for mild pain (pain score 1-3) or moderate pain (pain score 4-6).   ondansetron  8 MG tablet Wait to take this until your doctor or other care provider tells you to start again. Commonly known as: ZOFRAN  Take 8 mg by mouth every 6 (six) hours as needed for nausea or vomiting.   oxyCODONE  5 MG immediate release tablet Wait to  take this until your doctor or other care provider tells you to start again. Commonly known as: Oxy IR/ROXICODONE  Take 5 mg by mouth every 6 (six) hours as needed for severe pain (pain score 7-10) or moderate pain (pain score 4-6).       STOP taking these medications    Journavx  50 MG Tabs Generic drug: Suzetrigine    Melatonin 10 MG Tabs   pregabalin  200 MG capsule Commonly known as: LYRICA    prochlorperazine  5 MG tablet Commonly known as: COMPAZINE        TAKE these medications    acetaminophen  500 MG tablet Commonly known as: TYLENOL  Take 1,000 mg by mouth every 8 (eight) hours.   albuterol  (2.5 MG/3ML) 0.083% nebulizer solution Commonly known as: PROVENTIL  Take 2.5 mg by nebulization every 6 (six) hours as needed for wheezing or shortness of breath.   albuterol  108 (90 Base) MCG/ACT inhaler Commonly known as: VENTOLIN  HFA Inhale 2 puffs into the lungs every 6 (six) hours as needed for wheezing or shortness of breath.   aspirin  EC 81 MG tablet Take 81 mg by mouth daily.   bismuth subsalicylate 262 MG/15ML suspension Commonly known as: PEPTO BISMOL Take 30 mLs by mouth every 12 (twelve) hours as needed for diarrhea or loose stools.   busPIRone  15 MG tablet Commonly known as: BUSPAR  Take 15 mg by mouth 3 (three) times daily.  cefadroxil  500 MG capsule Commonly known as: DURICEF Take 1 capsule (500 mg total) by mouth 2 (two) times daily for 3 days.   Cholecalciferol  25 MCG (1000 UT) capsule Take 2,000 Units by mouth daily.   clonazePAM  0.5 MG tablet Commonly known as: KLONOPIN  Take 0.5 mg by mouth 3 (three) times daily as needed for anxiety.   dicyclomine 20 MG tablet Commonly known as: BENTYL Take 1 tablet (20 mg total) by mouth 2 (two) times daily.   docusate sodium  100 MG capsule Commonly known as: COLACE Take 100 mg by mouth every 12 (twelve) hours.   ferrous sulfate  325 (65 FE) MG tablet Take 1 tablet (325 mg total) by mouth daily with  breakfast. What changed: when to take this   fluticasone  50 MCG/ACT nasal spray Commonly known as: FLONASE  Place 1 spray into both nostrils daily.   hydrOXYzine  25 MG tablet Commonly known as: ATARAX  Take 1 tablet (25 mg total) by mouth 3 (three) times daily as needed for anxiety (moderate agitation). What changed:  when to take this reasons to take this   insulin  lispro 100 UNIT/ML KwikPen Commonly known as: HUMALOG  Inject 0-10 Units into the skin See admin instructions. Inject 0-10 units into the skin three times a day and at bedtime, PER SLIDING SCALE:  BGL 151-200 = 2 units 201-250 = 4 units 251-300 = 6 units 301-350 =  8 units 351-400 = 10 units   lactulose  10 GM/15ML solution Commonly known as: CHRONULAC  Take 30 mLs (20 g total) by mouth 2 (two) times daily.   levETIRAcetam  500 MG tablet Commonly known as: Keppra  Take 1 tablet (500 mg total) by mouth 2 (two) times daily.   lidocaine  4 % Place 1 patch onto the skin daily. Apply 1 new patch to the affected area and remove 12 hours later   loperamide  2 MG capsule Commonly known as: IMODIUM  Take 1 capsule (2 mg total) by mouth 4 (four) times daily as needed for diarrhea or loose stools.   loratadine  10 MG tablet Commonly known as: CLARITIN  Take 10 mg by mouth daily.   magnesium  oxide 400 (240 Mg) MG tablet Commonly known as: MAG-OX Take 400 mg by mouth 3 (three) times daily.   methadone  5 MG tablet Commonly known as: DOLOPHINE  Take 5 mg by mouth in the morning, at noon, and at bedtime. Take 1 tablet by mouth with a 10mg  Methadone  for a combined dose of 15mg  Methadone  3 times daily.   methadone  10 MG tablet Commonly known as: DOLOPHINE  Take 10 mg by mouth in the morning, at noon, and at bedtime. Take 1 tablet by mouth with a 5mg  Methadone  for a combined dose of 15mg  Methadone  3 times daily.   methocarbamol  500 MG tablet Commonly known as: ROBAXIN  Take 500 mg by mouth every 6 (six) hours as needed for muscle  spasms.   Narcan  4 MG/0.1ML Liqd nasal spray kit Generic drug: naloxone  Place 1 spray into the nose See admin instructions. Instill 1 spray into alternating nostrils every 5 minutes as needed for an opiate overdose   naloxone  0.4 MG/ML injection Commonly known as: NARCAN  Inject 0.4 mg into the muscle as needed (for oversedation).   OXcarbazepine  150 MG tablet Commonly known as: TRILEPTAL  Take 1 tablet (150 mg total) by mouth 2 (two) times daily.   QUEtiapine  25 MG tablet Commonly known as: SEROQUEL  Take 1 tablet (25 mg total) by mouth at bedtime and may repeat dose one time if needed. What changed: when to  take this   sodium bicarbonate  650 MG tablet Take 650 mg by mouth 2 (two) times daily.   sodium chloride  0.65 % Soln nasal spray Commonly known as: OCEAN Place 1 spray into both nostrils every 4 (four) hours as needed for congestion.   tamsulosin  0.4 MG Caps capsule Commonly known as: FLOMAX  Take 1 capsule (0.4 mg total) by mouth daily. What changed: when to take this   Uro-MP 118 MG Caps Take 118 mg by mouth 2 (two) times daily.               Discharge Care Instructions  (From admission, onward)           Start     Ordered   05/05/24 0000  Discharge wound care:       Comments: Cleanse sacrum/buttocks with Vashe wound cleanser, do not rinse and allow to air dry.  Apply Xeroform gauze to areas of purple discoloration daily and secure with ABD pad. Would avoid silicone foam if possible as this will hold moisture onto skin.   05/05/24 1015            Disposition and follow-up:   Mr.Zavon L Lippens was discharged from [facility] in Stable condition.  At the hospital follow up visit please address:  1.  Please ensure he takes the rest of his antibiotics which are scheduled through 05/07/2024  2.  Please review the multiple medications which were held for Qtc prolongation. Not all Qtc prolonging medications were held, but many were because  his Qtc was >600 on admission.  Follow-up Appointments:  Continue with your previously established care providers.  Hospital Course by problem list: RICHAD RAMSAY is a 63 y.o. person living with a history of HTN, COPD, DM2 (since 63 yo), CHF, previous CVA, previous R BKA, prev GBS sepsis with osteomyelitis 12-2023  who presented with Sepsis from L leg cellulitis and admitted for IV antibiotic management now being discharged on hospital day 4 with the following pertinent hospital course:  Sepsis likely 2/2 L leg Cellulitis   Pt was admitted for sepsis from L leg cellulitis which responded well to Zosyn  and Vancomycin . Sepsis quickly resolved and pt was deescalated to Ancef , and then Cefadroxil  for PO antibiotic treatment. He was then able to be transferred to SNF.   Acute Encephalopathic Pt was confused for much of 11/16, thought to be from delirium from infection. Fluids were maintained, BP was managed, and pt was back to baseline on 11/17 and did not have another encephalopathic event.   AKI on CKD3 Azotemia [name] Cr was 3.72 on admission from a presumed baseline of 1.73 one month prior. Improvement back to 2.86.  Normocytic Anemia  Pt Hgb on admission as 13.5, which was suspected to be heme concentrated, and stabilized around 8.0. There were no clear signs of bleeding, and he was monitored throughout his stay.  T2DM On a sliding scale at home. A1c 5.9 two months ago.  -continue sliding scale    Congestive HF Pt has a history of Congestive HF, but he appeared volume down so we held his Lasix  and repleted his fluids. He responded well to this and did not** have any HF exacerbation while admitted.    Ileostomy Patient was admitted with an ileostomy which was managed in the hospital without concern.    COPD Patient has history of COPD, there were no concerns with it during his hospitalization.   Qtc Prolongation  Patient had a  QTcB of  612 ms on admission. He is on multiple medications  that prolong his QTc, and multiple medication adjustments were made to help lower this. Would recommend careful reconsideration of medications prior to restarting them at home.   Subjective On day of discharge, patient was alert and oriented and reports that his pain has not gotten worse. He denies fever, SOB, chest pain, diarrhea, and abdominal pain. He says he would feel comfortable to go home. All questions were answered.  Discharge Exam:   BP (!) 104/58 (BP Location: Right Arm)   Pulse (!) 59   Temp 98 F (36.7 C)   Resp 18   Ht 6' (1.829 m)   Wt 89.5 kg   SpO2 97%   BMI 26.76 kg/m   Physical Exam Vitals and nursing note reviewed. Exam conducted with a chaperone present.  Constitutional:      General: He is not in acute distress. Cardiovascular:     Rate and Rhythm: Normal rate and regular rhythm.     Pulses: Normal pulses.     Heart sounds: No murmur heard. Pulmonary:     Effort: Pulmonary effort is normal. No respiratory distress.     Breath sounds: Wheezing present.  Abdominal:     General: There is no distension.     Palpations: Abdomen is soft.     Tenderness: There is no abdominal tenderness.     Comments: Ostomy bag  Musculoskeletal:     Right lower leg: No edema.     Left lower leg: No edema.     Comments: R AKA without swelling or erythema. L leg erythematous with hyperkeratotic scale, improved from yesterday L foot with two toe amputations  Skin:    General: Skin is warm and dry.  Neurological:     Mental Status: He is alert and oriented to person, place, and time.     Pertinent Labs, Studies, and Procedures:     Latest Ref Rng & Units 05/05/2024    5:34 AM 05/04/2024    3:08 AM 05/03/2024    7:38 AM  CBC  WBC 4.0 - 10.5 K/uL 5.8  5.7  8.3   Hemoglobin 13.0 - 17.0 g/dL 7.9  7.2  8.7   Hematocrit 39.0 - 52.0 % 25.0  22.3  28.0   Platelets 150 - 400 K/uL 151  130  147        Latest Ref Rng & Units 05/05/2024    5:34 AM 05/04/2024    3:12 AM  05/04/2024    3:08 AM  CMP  Glucose 70 - 99 mg/dL 62   88   BUN 8 - 23 mg/dL 44   49   Creatinine 9.38 - 1.24 mg/dL 7.13   6.67   Sodium 864 - 145 mmol/L 141   136   Potassium 3.5 - 5.1 mmol/L 3.6   3.5   Chloride 98 - 111 mmol/L 110   106   CO2 22 - 32 mmol/L 23   22   Calcium  8.9 - 10.3 mg/dL 8.4   7.9   Total Protein 6.5 - 8.1 g/dL  5.1    Total Bilirubin 0.0 - 1.2 mg/dL  0.9    Alkaline Phos 38 - 126 U/L  67    AST 15 - 41 U/L  41    ALT 0 - 44 U/L  17      CT FOOT LEFT WO CONTRAST Result Date: 05/02/2024 CLINICAL DATA:  Foot swelling, diabetic, osteomyelitis suspected, xray done EXAM: CT  OF THE LEFT FOOT WITHOUT CONTRAST TECHNIQUE: Multidetector CT imaging of the left foot was performed according to the standard protocol. Multiplanar CT image reconstructions were also generated. RADIATION DOSE REDUCTION: This exam was performed according to the departmental dose-optimization program which includes automated exposure control, adjustment of the mA and/or kV according to patient size and/or use of iterative reconstruction technique. COMPARISON:  Radiographs 05/01/2024 and 02/23/2024.  CT 02/26/2024. FINDINGS: Bones/Joint/Cartilage Left lower leg findings dictated separately. The planes of the reformatted images are different between the studies, limiting comparison. Grossly stable postsurgical changes from previous amputation through the distal shaft of the 1st and 2nd metatarsals and through the distal metaphysis of the 3rd proximal phalanx. Stable chronic erosions of the fused 5th middle and distal phalanges. No new areas of bone destruction identified. The bones are diffusely demineralized. Stable heterogeneous sclerosis within the medial and middle cuneiform bones. Ligaments Suboptimally assessed by CT. Muscles and Tendons Severe generalized muscular atrophy again noted. The ankle tendons appear intact. There are stable postsurgical changes related to the forefoot amputations. Soft tissues  Generalized subcutaneous edema throughout the ankle and dorsal aspect of the foot has progressed compared with the previous CT. No focal fluid collection, foreign body or soft tissue emphysema demonstrated. There are scattered vascular calcifications. IMPRESSION: 1. No CT evidence of osteomyelitis. 2. Stable postsurgical changes from previous amputations as described. 3. Progressive generalized subcutaneous edema throughout the ankle and dorsal aspect of the foot, nonspecific. No focal fluid collection, foreign body or soft tissue emphysema demonstrated. 4. Severe generalized muscular atrophy. Electronically Signed   By: Elsie Perone M.D.   On: 05/02/2024 10:29   CT TIBIA FIBULA LEFT WO CONTRAST Result Date: 05/02/2024 CLINICAL DATA:  Osteomyelitis suspected, tib/fib, xray done Soft tissue findings suspicious for cellulitis previous CT. EXAM: CT OF THE LOWER LEFT EXTREMITY WITHOUT CONTRAST TECHNIQUE: Multidetector CT imaging of the left lower leg was performed according to the standard protocol. RADIATION DOSE REDUCTION: This exam was performed according to the departmental dose-optimization program which includes automated exposure control, adjustment of the mA and/or kV according to patient size and/or use of iterative reconstruction technique. COMPARISON:  Prior CT 02/26/2024.  Radiographs 05/01/2024. FINDINGS: Left foot findings dictated separately. Bones/Joint/Cartilage The bones appear demineralized. No evidence of acute fracture, dislocation or osteomyelitis. There are medial compartment degenerative changes at the knee without significant joint effusion. No significant arthropathy or effusion demonstrated at the left ankle. Ligaments Suboptimally assessed by CT. Muscles and Tendons Severe generalized fatty atrophy again noted throughout the left lower leg musculature. There is also involvement of the distal hamstring musculature. The visualized quadriceps and patellar tendons are intact. The ankle  tendons appear intact, without significant tenosynovitis. Soft tissues Circumferential subcutaneous edema throughout the lower leg has progressed with associated dermal thickening and mild cutaneous irregularity, as before. No organized fluid collection, foreign body or soft tissue emphysema identified. There are diffuse vascular calcifications. IMPRESSION: 1. No evidence of osteomyelitis or septic arthritis in the left lower leg. 2. Progressive circumferential subcutaneous edema throughout the lower leg with associated dermal thickening and mild cutaneous irregularity, consistent with cellulitis. Differential includes lymphedema, heart failure and venous insufficiency. No organized fluid collection, foreign body or soft tissue emphysema identified. 3. Severe generalized fatty atrophy of the left lower leg musculature. 4. Left foot findings dictated separately. Electronically Signed   By: Elsie Perone M.D.   On: 05/02/2024 10:19   DG Tibia/Fibula Left Result Date: 05/01/2024 EXAM: 2 VIEW(S) Xray of the Left Tibia and  Fibula 05/01/2024 01:56:00 PM COMPARISON: None available. CLINICAL HISTORY: Known osteo Known osteo FINDINGS: BONES AND JOINTS: No acute fracture. No focal osseous lesion. No joint dislocation. SOFT TISSUES: The soft tissues are unremarkable. IMPRESSION: 1. No significant abnormality. Electronically signed by: Lynwood Seip MD 05/01/2024 02:27 PM EST RP Workstation: [id number]   DG Foot 2 Views Left Result Date: 05/01/2024 EXAM: 2 VIEW(S) XRAY OF THE LEFT FOOT 05/01/2024 01:56:00 PM COMPARISON: 02/23/2024. Only lateral image was submitted for review. CLINICAL HISTORY: known osteo known osteo FINDINGS: BONES AND JOINTS: Status post amputation of distal portions of first and second metatarsals and phalanges. Osteomyelitis cannot be excluded due to the single limited image. No acute fracture. No joint dislocation. SOFT TISSUES: Vascular calcifications are noted. Severe dorsal soft tissue swelling  is noted most consistent with infection. IMPRESSION: 1. Severe dorsal soft tissue swelling, suspicious for infection. Osteomyelitis cannot be excluded due to single limited lateral view. 2. 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 Electronically signed by: Lynwood Seip MD 05/01/2024 02:26 PM EST RP Workstation: [id number]   DG Chest Port 1 View Result Date: 05/01/2024 CLINICAL DATA:  Sepsis. EXAM: PORTABLE CHEST 1 VIEW COMPARISON:  03/10/2024 FINDINGS: The heart size and mediastinal contours are within normal limits. Low lung volumes with bibasilar atelectasis. There is no evidence of pulmonary edema, consolidation, pneumothorax or pleural fluid. The visualized skeletal structures are unremarkable. IMPRESSION: Low lung volumes with bibasilar atelectasis. Electronically Signed   By: Marcey Moan M.D.   On: 05/01/2024 14:14    Discharge Instructions: Discharge Instructions     Call MD for:  difficulty breathing, headache or visual disturbances   Complete by: As directed    Call MD for:  extreme fatigue   Complete by: As directed    Call MD for:  hives   Complete by: As directed    Call MD for:  persistant dizziness or light-headedness   Complete by: As directed    Call MD for:  persistant nausea and vomiting   Complete by: As directed    Call MD for:  redness, tenderness, or signs of infection (pain, swelling, redness, odor or green/yellow discharge around incision site)   Complete by: As directed    Call MD for:  severe uncontrolled pain   Complete by: As directed    Call MD for:  temperature >100.4   Complete by: As directed    Diet - low sodium heart healthy   Complete by: As directed    Discharge wound care:   Complete by: As directed    Cleanse sacrum/buttocks with Vashe wound cleanser, do not rinse and allow to air dry.  Apply Xeroform gauze to areas of purple discoloration daily and secure with ABD pad. Would avoid silicone foam  if possible as this will hold moisture onto skin.   Increase activity slowly   Complete by: As directed       Signed: Lera Nancyann NOVAK, DO - PGY1 Internal Medicine Residency Service 05/05/2024, 10:47 AM

## 2024-05-05 NOTE — Progress Notes (Signed)
 Report called to Merlynn, LPN at [facility].

## 2024-05-05 NOTE — TOC Transition Note (Signed)
 Transition of Care [facility]) - Discharge Note   Patient Details  Name: Gregory [name] MRN: [id number] Date of Birth: [birth year]  Transition of Care [facility]) CM/SW Contact:  Jeoffrey LITTIE Maranda ISRAEL Phone Number: 05/05/2024, 12:04 PM   Clinical Narrative:    Patient will DC to: [facility] Anticipated DC date: 05/05/24 Family notified: Yes Transport by: [facility]   Per MD patient ready for DC to [facility]. RN to call report prior to discharge [phone]. RN, patient, patient's family, and facility notified of DC. Discharge Summary and FL2 sent to facility. DC packet on chart. Ambulance transport requested for patient.   CSW will sign off for now as social work intervention is no longer needed. Please consult us  again if new needs arise.     Final next level of care: Long Term Nursing Home Barriers to Discharge: Barriers Resolved   Patient Goals and CMS Choice Patient states their goals for this hospitalization and ongoing recovery are:: Return to [facility]          Discharge Placement   Existing PASRR number confirmed : 05/05/24          Patient chooses bed at: Other - please specify in the comment section below: [facility]) Patient to be transferred to facility by: [facility] Name of family member notified: [name] Patient and family notified of of transfer: 05/05/24  Discharge Plan and Services Additional resources added to the After Visit Summary for                                       Social Drivers of Health (SDOH) Interventions SDOH Screenings   Food Insecurity: No Food Insecurity (05/01/2024)  Housing: Low Risk  (05/01/2024)  Transportation Needs: No Transportation Needs (05/01/2024)  Utilities: Not At Risk (05/01/2024)  Depression (PHQ2-9): Low Risk  (03/08/2021)  Social Connections: Socially Isolated (09/26/2023)  Tobacco Use: High Risk (05/01/2024)     Readmission Risk Interventions    09/04/2023    1:49 PM 09/06/2022    1:48 PM 08/27/2022   12:46 PM  Readmission  Risk Prevention Plan  Transportation Screening Complete Complete Complete  Medication Review Oceanographer) Complete Complete Complete  PCP or Specialist appointment within 3-5 days of discharge Complete Complete Complete  HRI or Home Care Consult Complete Complete Complete  SW Recovery Care/Counseling Consult Complete Complete Complete  Palliative Care Screening Not Applicable Not Applicable Not Applicable  Skilled Nursing Facility Complete Complete Complete

## 2024-05-06 LAB — CULTURE, BLOOD (ROUTINE X 2): Culture: NO GROWTH

## 2024-05-07 LAB — CULTURE, BLOOD (ROUTINE X 2): Culture: NO GROWTH
